# Patient Record
Sex: Female | Born: 1945
Health system: Southern US, Community
[De-identification: ages and names within clinical notes are randomized; demographics above are authoritative.]

## PROBLEM LIST (undated history)

## (undated) ENCOUNTER — Ambulatory Visit (HOSPITAL_COMMUNITY): Discharge status: Absent without leave | Payer: Commercial Managed Care - HMO

## (undated) DIAGNOSIS — I509 Heart failure, unspecified: Secondary | ICD-10-CM

## (undated) DIAGNOSIS — Z950 Presence of cardiac pacemaker: Secondary | ICD-10-CM

## (undated) DIAGNOSIS — K635 Polyp of colon: Secondary | ICD-10-CM

## (undated) DIAGNOSIS — L02619 Cutaneous abscess of unspecified foot: Secondary | ICD-10-CM

## (undated) DIAGNOSIS — I10 Essential (primary) hypertension: Secondary | ICD-10-CM

## (undated) DIAGNOSIS — K2970 Gastritis, unspecified, without bleeding: Secondary | ICD-10-CM

## (undated) DIAGNOSIS — I255 Ischemic cardiomyopathy: Secondary | ICD-10-CM

## (undated) DIAGNOSIS — M109 Gout, unspecified: Secondary | ICD-10-CM

## (undated) DIAGNOSIS — K760 Fatty (change of) liver, not elsewhere classified: Secondary | ICD-10-CM

## (undated) DIAGNOSIS — G8929 Other chronic pain: Secondary | ICD-10-CM

## (undated) DIAGNOSIS — Z9981 Dependence on supplemental oxygen: Secondary | ICD-10-CM

## (undated) DIAGNOSIS — Z95810 Presence of automatic (implantable) cardiac defibrillator: Secondary | ICD-10-CM

## (undated) DIAGNOSIS — I2510 Atherosclerotic heart disease of native coronary artery without angina pectoris: Secondary | ICD-10-CM

## (undated) DIAGNOSIS — L03119 Cellulitis of unspecified part of limb: Secondary | ICD-10-CM

## (undated) DIAGNOSIS — K5790 Diverticulosis of intestine, part unspecified, without perforation or abscess without bleeding: Secondary | ICD-10-CM

## (undated) DIAGNOSIS — E119 Type 2 diabetes mellitus without complications: Secondary | ICD-10-CM

## (undated) DIAGNOSIS — M545 Low back pain, unspecified: Secondary | ICD-10-CM

## (undated) DIAGNOSIS — E7800 Pure hypercholesterolemia, unspecified: Secondary | ICD-10-CM

## (undated) DIAGNOSIS — J42 Unspecified chronic bronchitis: Secondary | ICD-10-CM

## (undated) DIAGNOSIS — D649 Anemia, unspecified: Secondary | ICD-10-CM

## (undated) DIAGNOSIS — G4733 Obstructive sleep apnea (adult) (pediatric): Secondary | ICD-10-CM

## (undated) DIAGNOSIS — R0602 Shortness of breath: Secondary | ICD-10-CM

## (undated) DIAGNOSIS — K648 Other hemorrhoids: Secondary | ICD-10-CM

## (undated) DIAGNOSIS — J189 Pneumonia, unspecified organism: Secondary | ICD-10-CM

## (undated) DIAGNOSIS — Z8719 Personal history of other diseases of the digestive system: Secondary | ICD-10-CM

## (undated) DIAGNOSIS — R200 Anesthesia of skin: Secondary | ICD-10-CM

## (undated) DIAGNOSIS — M1990 Unspecified osteoarthritis, unspecified site: Secondary | ICD-10-CM

## (undated) DIAGNOSIS — G459 Transient cerebral ischemic attack, unspecified: Secondary | ICD-10-CM

## (undated) DIAGNOSIS — K589 Irritable bowel syndrome without diarrhea: Secondary | ICD-10-CM

## (undated) DIAGNOSIS — K219 Gastro-esophageal reflux disease without esophagitis: Secondary | ICD-10-CM

## (undated) DIAGNOSIS — E669 Obesity, unspecified: Secondary | ICD-10-CM

## (undated) HISTORY — DX: Polyp of colon: K63.5

## (undated) HISTORY — DX: Gastro-esophageal reflux disease without esophagitis: K21.9

## (undated) HISTORY — PX: TUBAL LIGATION: SHX77

## (undated) HISTORY — DX: Diverticulosis of intestine, part unspecified, without perforation or abscess without bleeding: K57.90

## (undated) HISTORY — PX: BIV ICD INSERTION CRT-D: EP1195

## (undated) HISTORY — DX: Other hemorrhoids: K64.8

## (undated) HISTORY — PX: APPENDECTOMY: SHX54

## (undated) HISTORY — DX: Gastritis, unspecified, without bleeding: K29.70

## (undated) HISTORY — PX: BACK SURGERY: SHX140

## (undated) HISTORY — PX: FRACTURE SURGERY: SHX138

## (undated) HISTORY — PX: ANTERIOR CERVICAL DECOMP/DISCECTOMY FUSION: SHX1161

## (undated) HISTORY — PX: ANKLE FRACTURE SURGERY: SHX122

## (undated) HISTORY — DX: Irritable bowel syndrome, unspecified: K58.9

## (undated) HISTORY — DX: Ischemic cardiomyopathy: I25.5

## (undated) HISTORY — DX: Anemia, unspecified: D64.9

## (undated) HISTORY — PX: KNEE ARTHROSCOPY: SHX127

## (undated) HISTORY — DX: Atherosclerotic heart disease of native coronary artery without angina pectoris: I25.10

## (undated) HISTORY — DX: Fatty (change of) liver, not elsewhere classified: K76.0

---

## 1997-02-10 HISTORY — PX: INSERT / REPLACE / REMOVE PACEMAKER: SUR710

## 1998-05-14 ENCOUNTER — Emergency Department (HOSPITAL_COMMUNITY): Admission: EM | Admit: 1998-05-14 | Discharge: 1998-05-14 | Payer: Self-pay | Admitting: Emergency Medicine

## 1998-05-14 ENCOUNTER — Encounter: Payer: Self-pay | Admitting: *Deleted

## 1999-05-16 ENCOUNTER — Inpatient Hospital Stay (HOSPITAL_COMMUNITY): Admission: EM | Admit: 1999-05-16 | Discharge: 1999-05-21 | Payer: Self-pay | Admitting: Emergency Medicine

## 1999-05-16 ENCOUNTER — Encounter: Payer: Self-pay | Admitting: Family Medicine

## 1999-05-17 HISTORY — PX: CARDIAC CATHETERIZATION: SHX172

## 1999-05-28 ENCOUNTER — Ambulatory Visit (HOSPITAL_COMMUNITY): Admission: RE | Admit: 1999-05-28 | Discharge: 1999-05-29 | Payer: Self-pay | Admitting: Cardiovascular Disease

## 1999-05-29 ENCOUNTER — Encounter: Payer: Self-pay | Admitting: Cardiovascular Disease

## 1999-06-04 ENCOUNTER — Encounter: Admission: RE | Admit: 1999-06-04 | Discharge: 1999-06-04 | Payer: Self-pay | Admitting: Family Medicine

## 1999-06-05 ENCOUNTER — Inpatient Hospital Stay (HOSPITAL_COMMUNITY): Admission: EM | Admit: 1999-06-05 | Discharge: 1999-06-08 | Payer: Self-pay | Admitting: Emergency Medicine

## 2000-07-01 ENCOUNTER — Ambulatory Visit (HOSPITAL_COMMUNITY): Admission: RE | Admit: 2000-07-01 | Discharge: 2000-07-01 | Payer: Self-pay | Admitting: Family Medicine

## 2000-07-01 ENCOUNTER — Encounter: Payer: Self-pay | Admitting: Family Medicine

## 2000-07-12 ENCOUNTER — Emergency Department (HOSPITAL_COMMUNITY): Admission: EM | Admit: 2000-07-12 | Discharge: 2000-07-12 | Payer: Self-pay | Admitting: Emergency Medicine

## 2001-01-06 ENCOUNTER — Ambulatory Visit (HOSPITAL_COMMUNITY): Admission: RE | Admit: 2001-01-06 | Discharge: 2001-01-06 | Payer: Self-pay | Admitting: *Deleted

## 2001-01-06 ENCOUNTER — Encounter: Payer: Self-pay | Admitting: *Deleted

## 2001-02-15 ENCOUNTER — Encounter: Payer: Self-pay | Admitting: *Deleted

## 2001-02-15 ENCOUNTER — Encounter: Admission: RE | Admit: 2001-02-15 | Discharge: 2001-02-15 | Payer: Self-pay | Admitting: *Deleted

## 2001-02-18 ENCOUNTER — Encounter: Payer: Self-pay | Admitting: *Deleted

## 2001-02-18 ENCOUNTER — Ambulatory Visit (HOSPITAL_COMMUNITY): Admission: RE | Admit: 2001-02-18 | Discharge: 2001-02-19 | Payer: Self-pay | Admitting: *Deleted

## 2002-02-16 DIAGNOSIS — K7689 Other specified diseases of liver: Secondary | ICD-10-CM | POA: Insufficient documentation

## 2002-07-08 ENCOUNTER — Encounter: Payer: Self-pay | Admitting: Emergency Medicine

## 2002-07-08 ENCOUNTER — Observation Stay (HOSPITAL_COMMUNITY): Admission: EM | Admit: 2002-07-08 | Discharge: 2002-07-09 | Payer: Self-pay | Admitting: Emergency Medicine

## 2002-07-08 HISTORY — PX: CARDIAC CATHETERIZATION: SHX172

## 2002-09-27 ENCOUNTER — Encounter: Payer: Self-pay | Admitting: Nurse Practitioner

## 2002-09-27 ENCOUNTER — Encounter: Payer: Self-pay | Admitting: Internal Medicine

## 2002-09-27 DIAGNOSIS — D126 Benign neoplasm of colon, unspecified: Secondary | ICD-10-CM | POA: Insufficient documentation

## 2002-11-25 ENCOUNTER — Encounter: Payer: Self-pay | Admitting: Family Medicine

## 2002-11-25 ENCOUNTER — Ambulatory Visit (HOSPITAL_COMMUNITY): Admission: RE | Admit: 2002-11-25 | Discharge: 2002-11-25 | Payer: Self-pay | Admitting: Family Medicine

## 2003-08-11 ENCOUNTER — Emergency Department (HOSPITAL_COMMUNITY): Admission: EM | Admit: 2003-08-11 | Discharge: 2003-08-11 | Payer: Self-pay | Admitting: Family Medicine

## 2003-12-31 ENCOUNTER — Emergency Department (HOSPITAL_COMMUNITY): Admission: EM | Admit: 2003-12-31 | Discharge: 2003-12-31 | Payer: Self-pay | Admitting: Emergency Medicine

## 2004-02-16 ENCOUNTER — Observation Stay (HOSPITAL_COMMUNITY): Admission: EM | Admit: 2004-02-16 | Discharge: 2004-02-17 | Payer: Self-pay | Admitting: Emergency Medicine

## 2004-07-14 ENCOUNTER — Emergency Department (HOSPITAL_COMMUNITY): Admission: EM | Admit: 2004-07-14 | Discharge: 2004-07-14 | Payer: Self-pay | Admitting: Emergency Medicine

## 2004-11-18 ENCOUNTER — Ambulatory Visit (HOSPITAL_COMMUNITY): Admission: RE | Admit: 2004-11-18 | Discharge: 2004-11-18 | Payer: Self-pay | Admitting: Family Medicine

## 2004-12-03 ENCOUNTER — Emergency Department (HOSPITAL_COMMUNITY): Admission: EM | Admit: 2004-12-03 | Discharge: 2004-12-03 | Payer: Self-pay | Admitting: Emergency Medicine

## 2004-12-26 IMAGING — CT CT HEAD W/O CM
1 of 2 series · 14 of 30 positions shown, 18 images · IV contrast (agent unspecified)
Comparison: none

CLINICAL DATA: Dizziness.  Weakness.
TECHNIQUE: Contiguous axial CT images were obtained from the skull base through the vertex.
 No prior studies. 
 CT OF THE HEAD WITHOUT CONTRAST:

[Series 2: brain · axial · 0.47mm/px · z∈[+187,+306]mm · 14 of 36 slices shown, 18 images]
[im 3/36  brain]
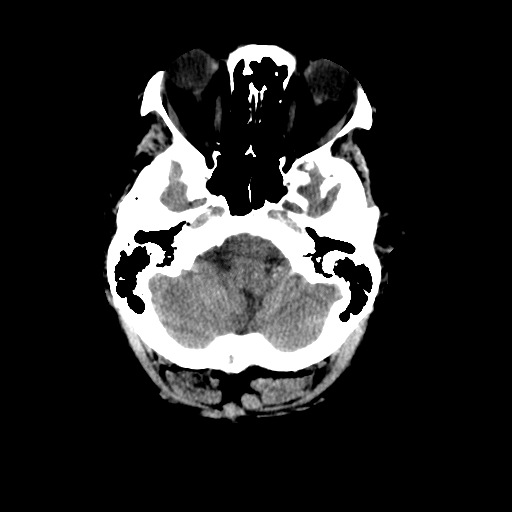
[im 3/36  bone]
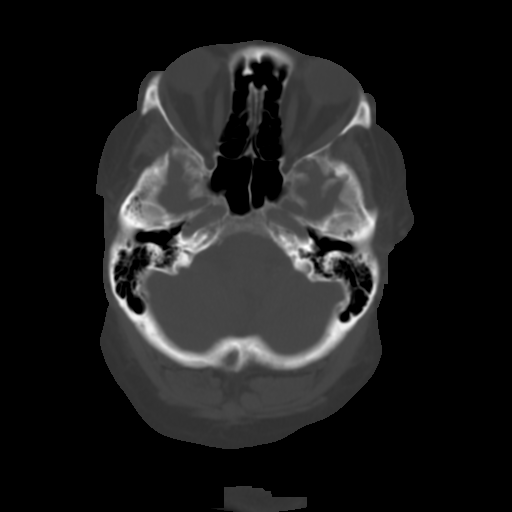
[im 5/36  brain]
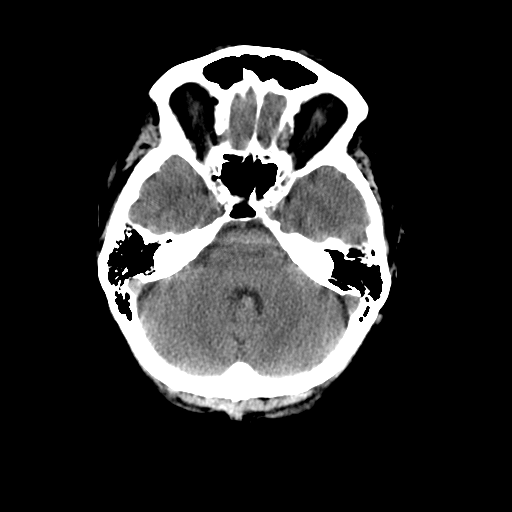
[im 8/36  brain]
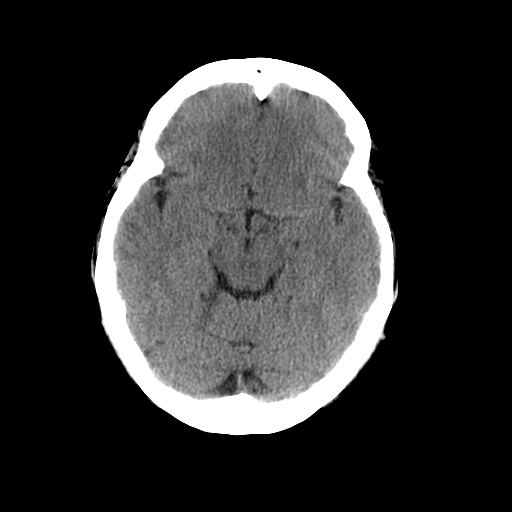
[im 10/36  brain]
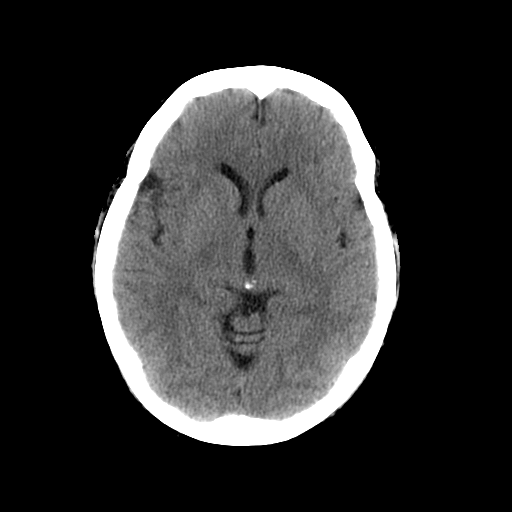
[im 12/36  brain]
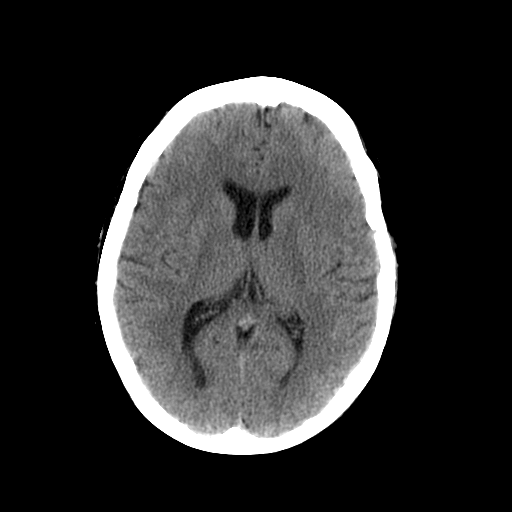
[im 12/36  bone]
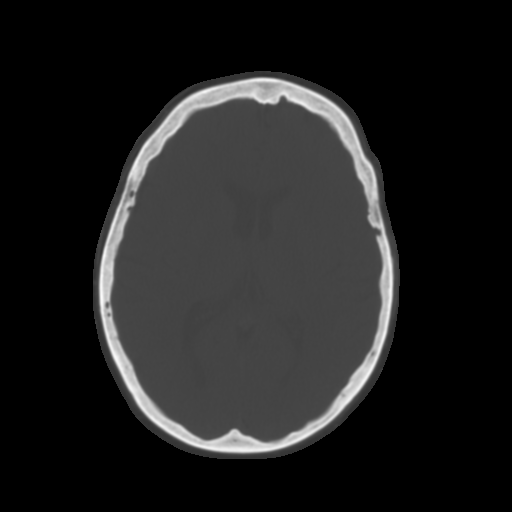
[im 15/36  brain]
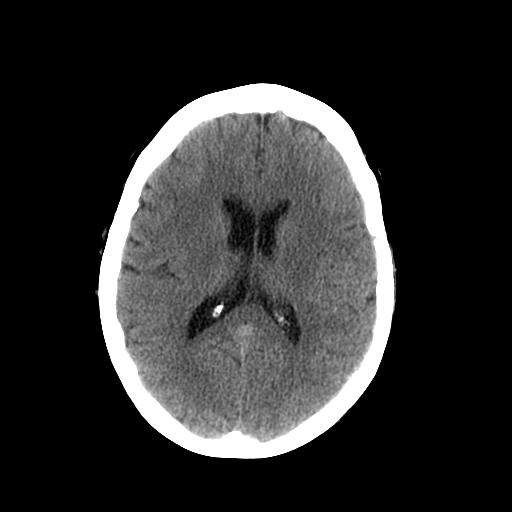
[im 17/36  brain]
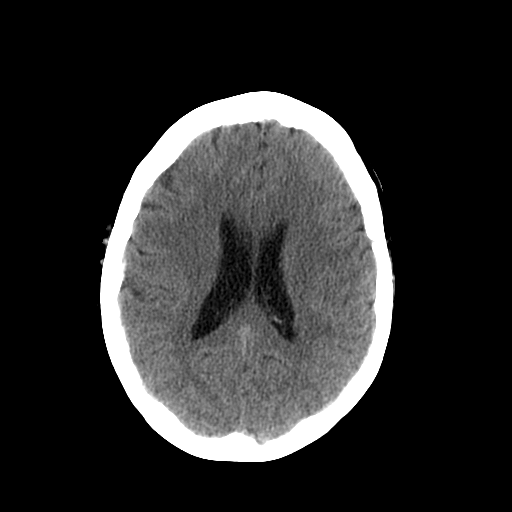
[im 19/36  brain]
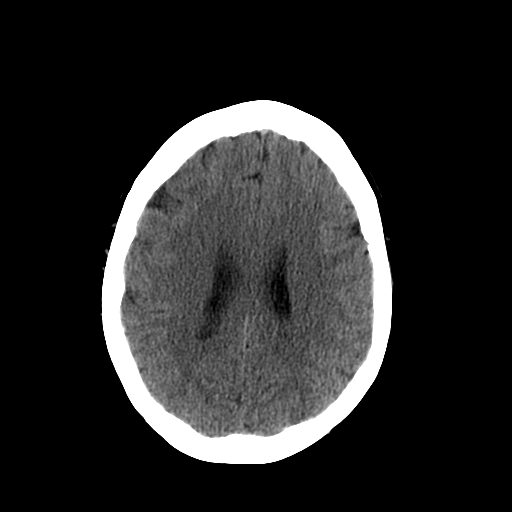
[im 22/36  brain]
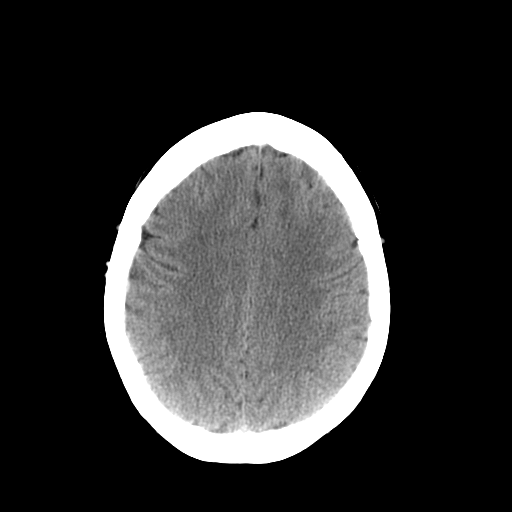
[im 22/36  bone]
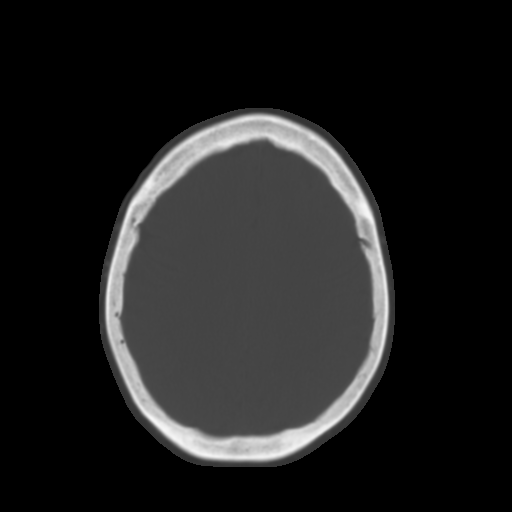
[im 24/36  brain]
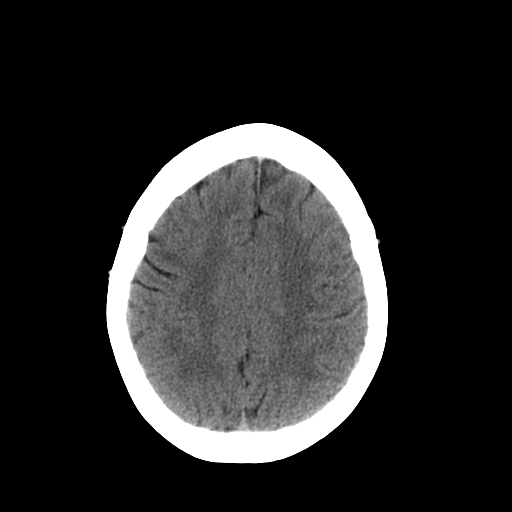
[im 26/36  brain]
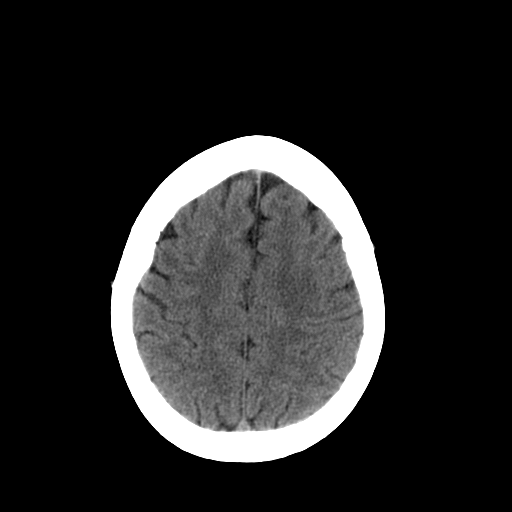
[im 29/36  brain]
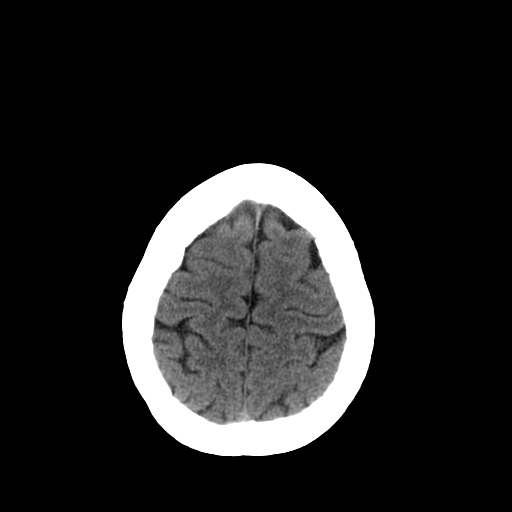
[im 31/36  brain]
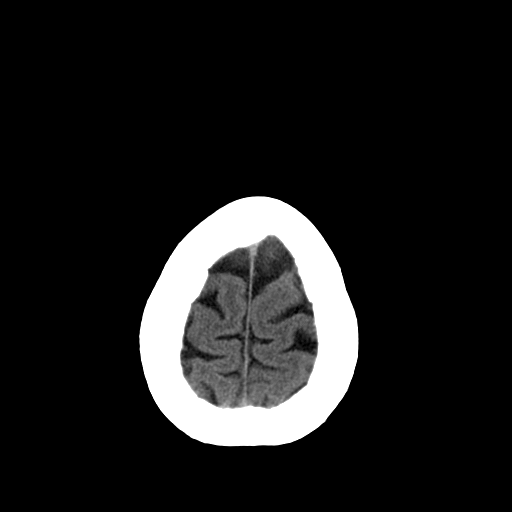
[im 31/36  bone]
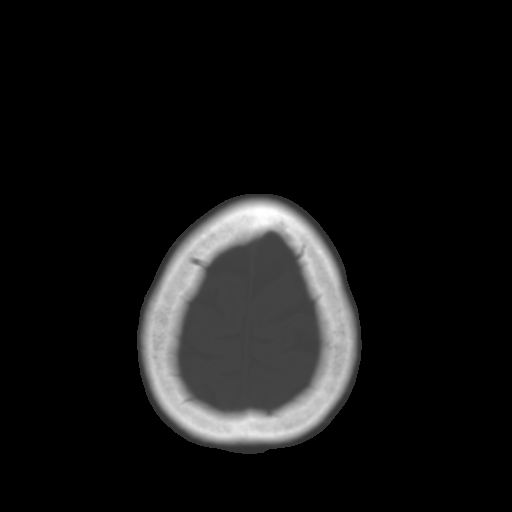
[im 33/36  brain]
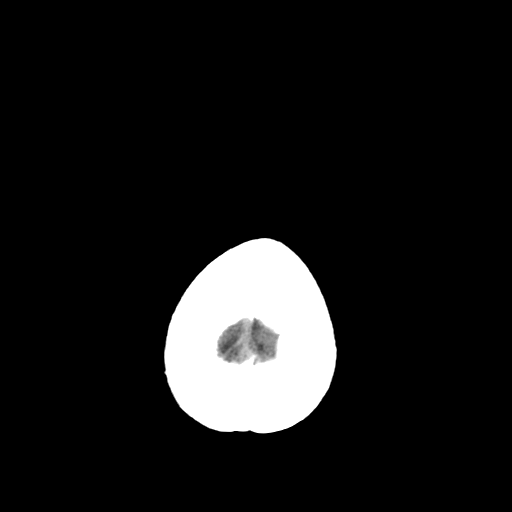

[14 of 30 positions shown; findings below may reference images not displayed]

FINDINGS: Vague hypodensity in the left pons and midbrain is thought to represent artifact.  There is a suggestion of minimal chronic microvascular white matter disease.  No acute intracranial findings are evident.
IMPRESSION: No acute intracranial findings are noted.  Please note that acute CVA can be occult on CT scan.

## 2004-12-26 IMAGING — CR DG CHEST 2V
2 series · 2 of 2 positions shown · non-contrast
Comparison: none

CLINICAL DATA: Dizziness, chest congestion, shortness of breath, asthma, hypertension. 
 TWO VIEW CHEST:
 Comparing to a report from prior chest radiograph dated 07/08/02.

[view not recorded (1 of 2)]
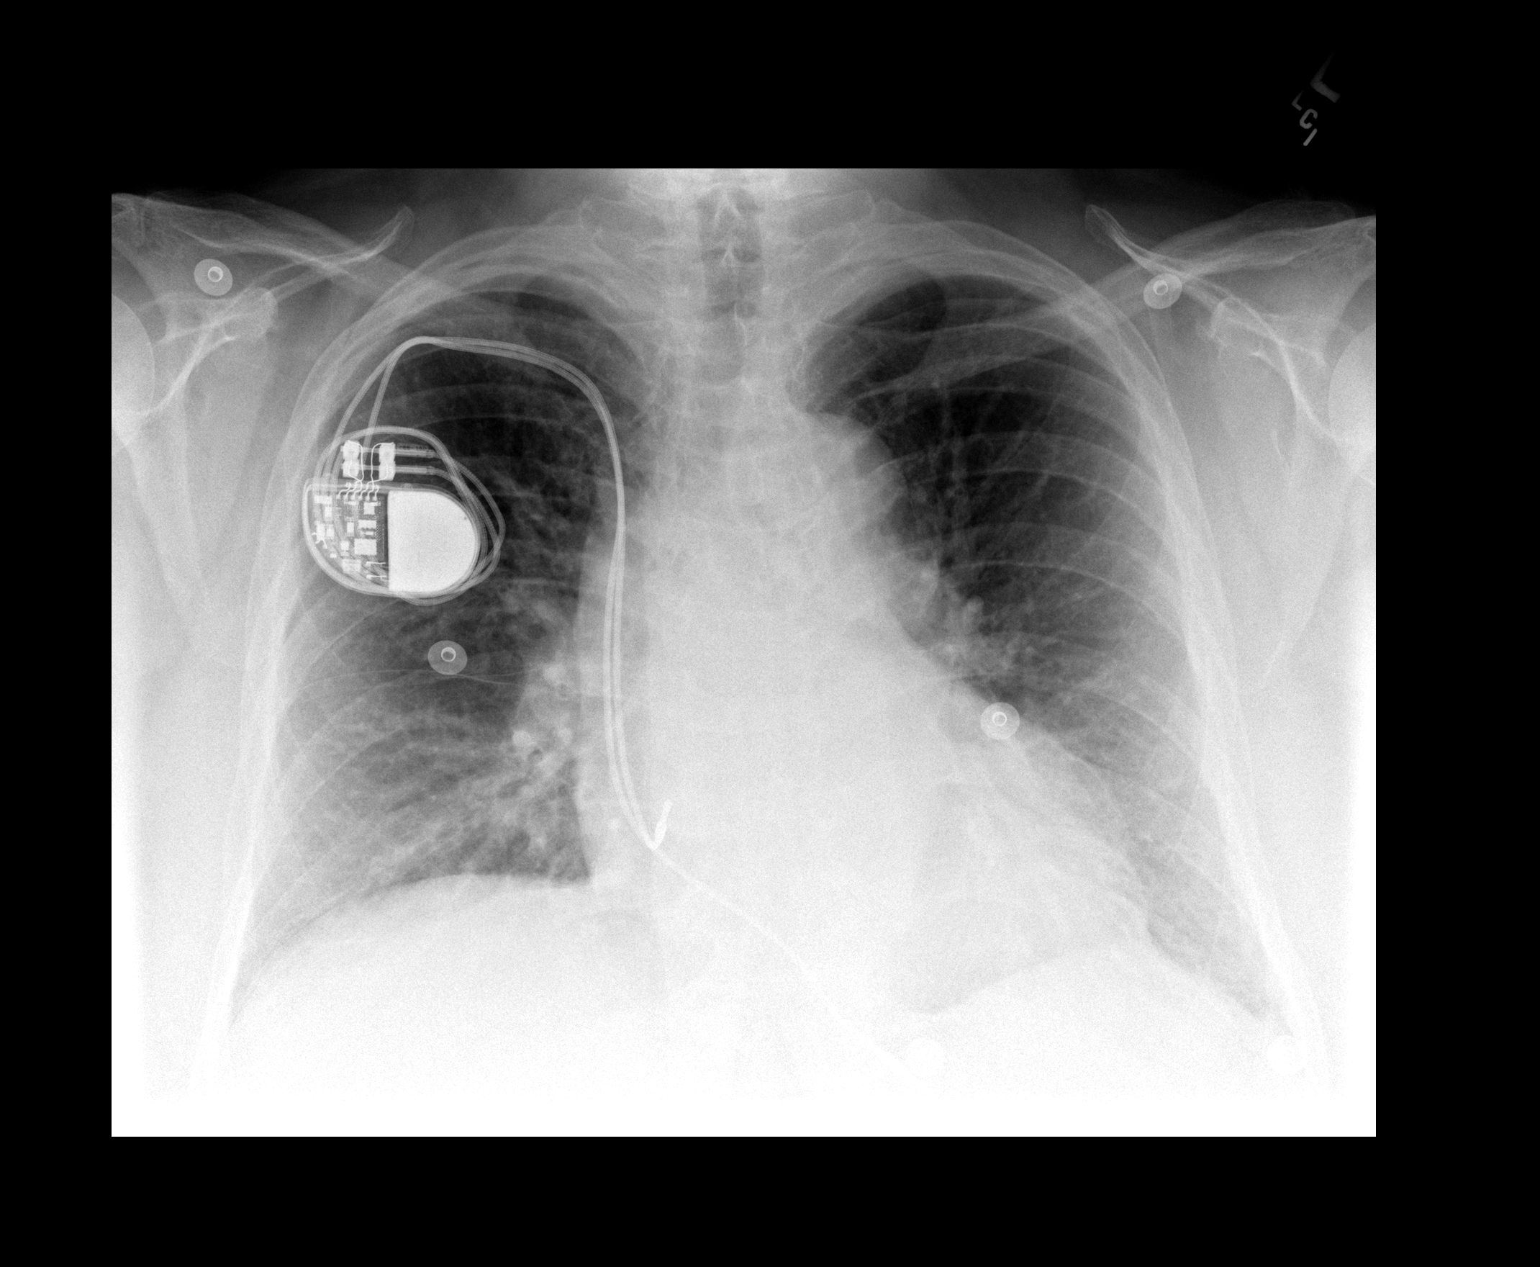

[view not recorded (2 of 2)]
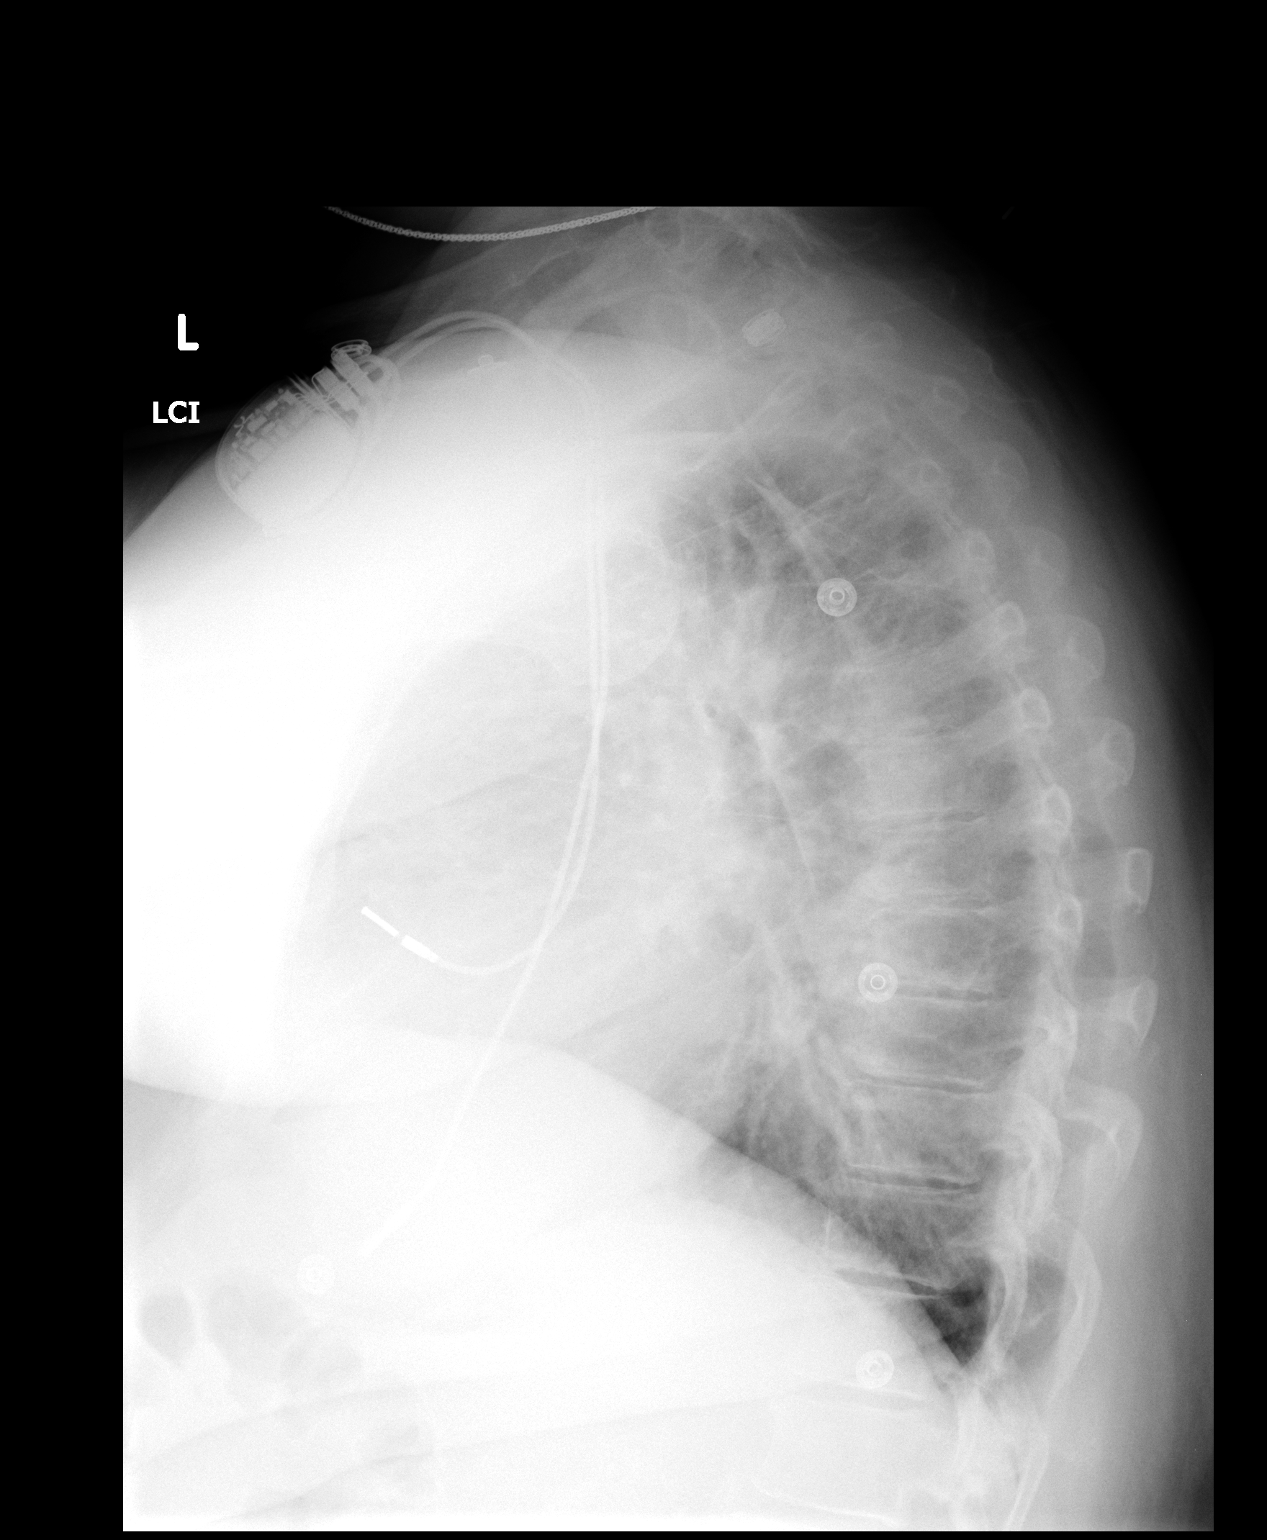

[2 of 2 positions shown; findings below may reference images not displayed]

FINDINGS: Patient has a pacer device.  Cardiomegaly is present.  Prominence of the pulmonary vasculature suggests pulmonary venous hypertension.  There is some mild tortuosity of the thoracic aorta. There is also some mild airway thickening centrally.
IMPRESSION: 1.  Mild airway thickening centrally suggesting bronchitis or reactive airways disease. 
 2.  Mild cardiomegaly and borderline pulmonary venous hypertension.

## 2005-02-11 IMAGING — CR DG CHEST 1V PORT
1 series · 1 of 1 positions shown · non-contrast
Comparison: none

CLINICAL DATA: 58 year old with chest pain, shortness of breath, hypertension and history of smoking. Comparison: 12/31/03. 
 The patient has a right-sided transvenous pacemaker with leads overlying the right atrium and right ventricle.  Cardiac size is mildly enlarged but there is no evidence for pulmonary edema.   No focal consolidations or pleural effusion.

[view not recorded]
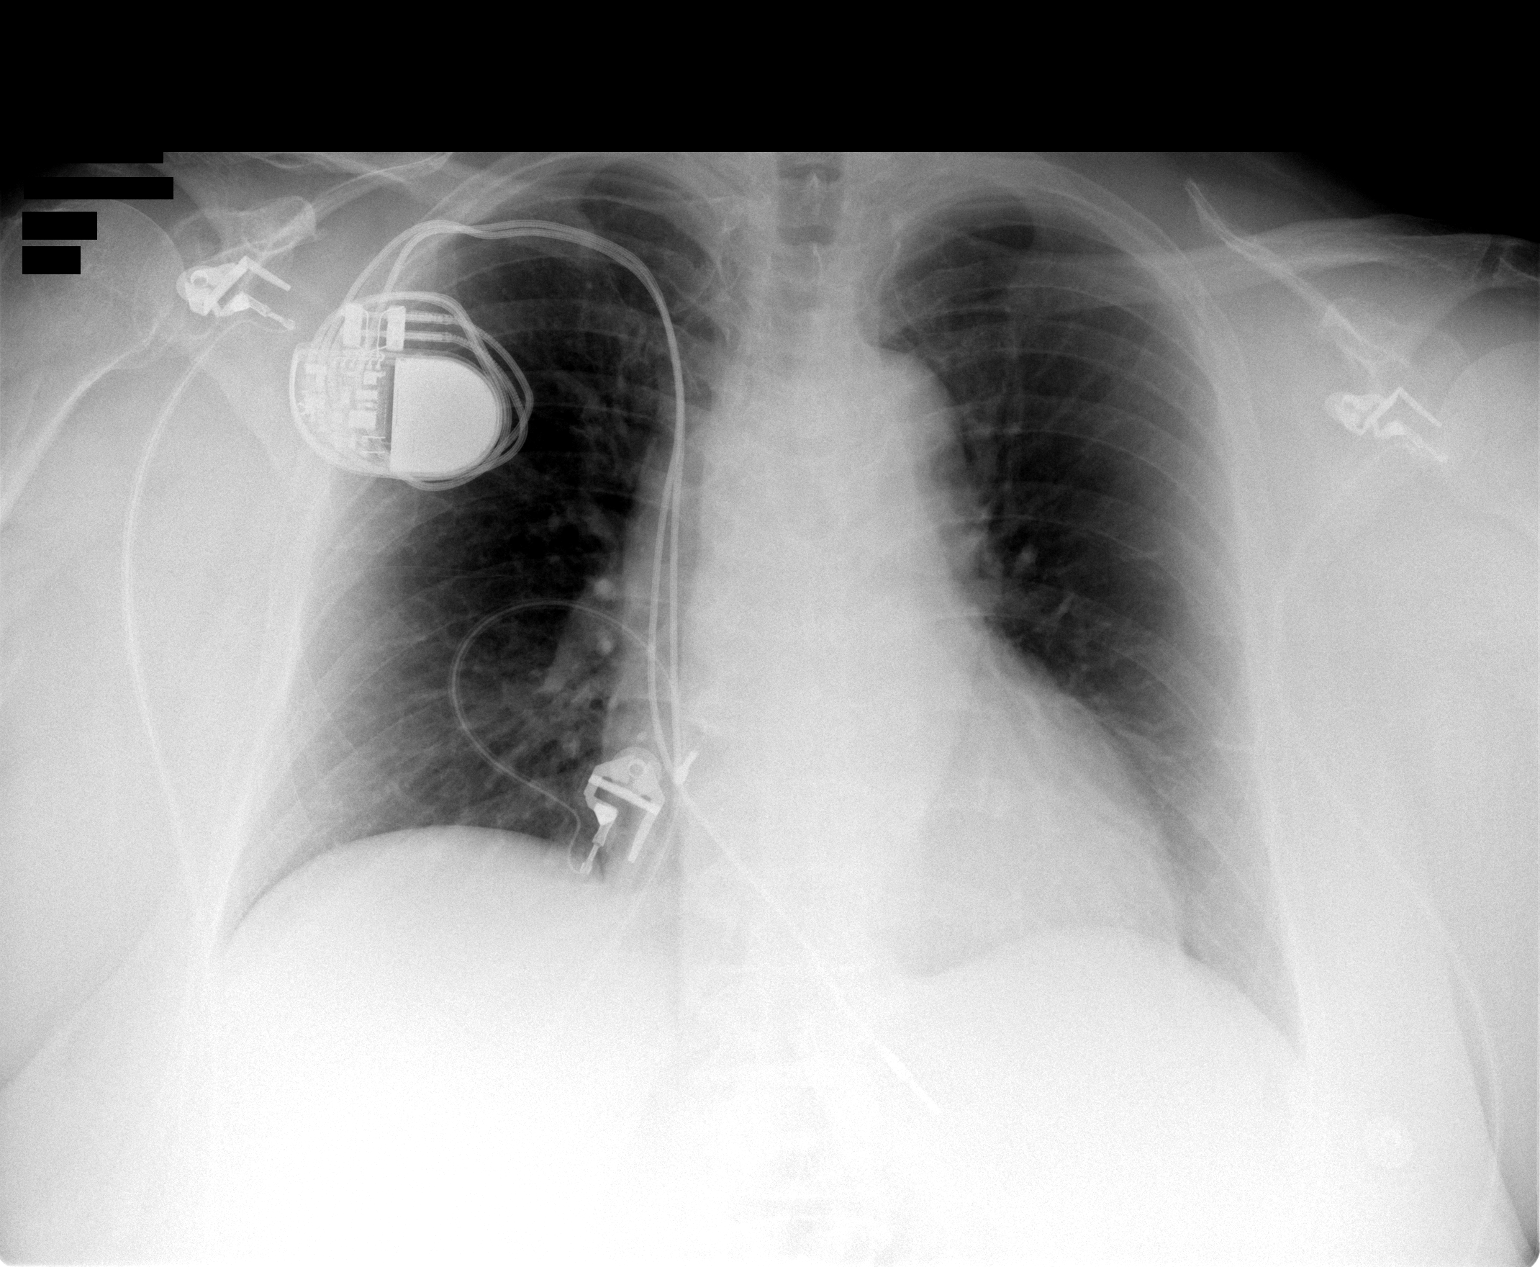

[1 of 1 positions shown; findings below may reference images not displayed]

IMPRESSION: Mild cardiomegaly without evidence for pulmonary edema.

## 2005-06-17 ENCOUNTER — Ambulatory Visit (HOSPITAL_BASED_OUTPATIENT_CLINIC_OR_DEPARTMENT_OTHER): Admission: RE | Admit: 2005-06-17 | Discharge: 2005-06-17 | Payer: Self-pay | Admitting: Otolaryngology

## 2005-06-17 ENCOUNTER — Encounter (INDEPENDENT_AMBULATORY_CARE_PROVIDER_SITE_OTHER): Payer: Self-pay | Admitting: Specialist

## 2005-06-27 ENCOUNTER — Emergency Department (HOSPITAL_COMMUNITY): Admission: EM | Admit: 2005-06-27 | Discharge: 2005-06-27 | Payer: Self-pay | Admitting: Emergency Medicine

## 2005-07-10 IMAGING — CR DG CHEST 2V
2 series · 2 of 2 positions shown · non-contrast
Comparison: Portable chest x-ray 02/16/2004.

CLINICAL DATA: History of asthma. One week history of shortness of breath and
chest pain.

CHEST - 2 VIEW  07/14/2004:

[view not recorded (1 of 2)]
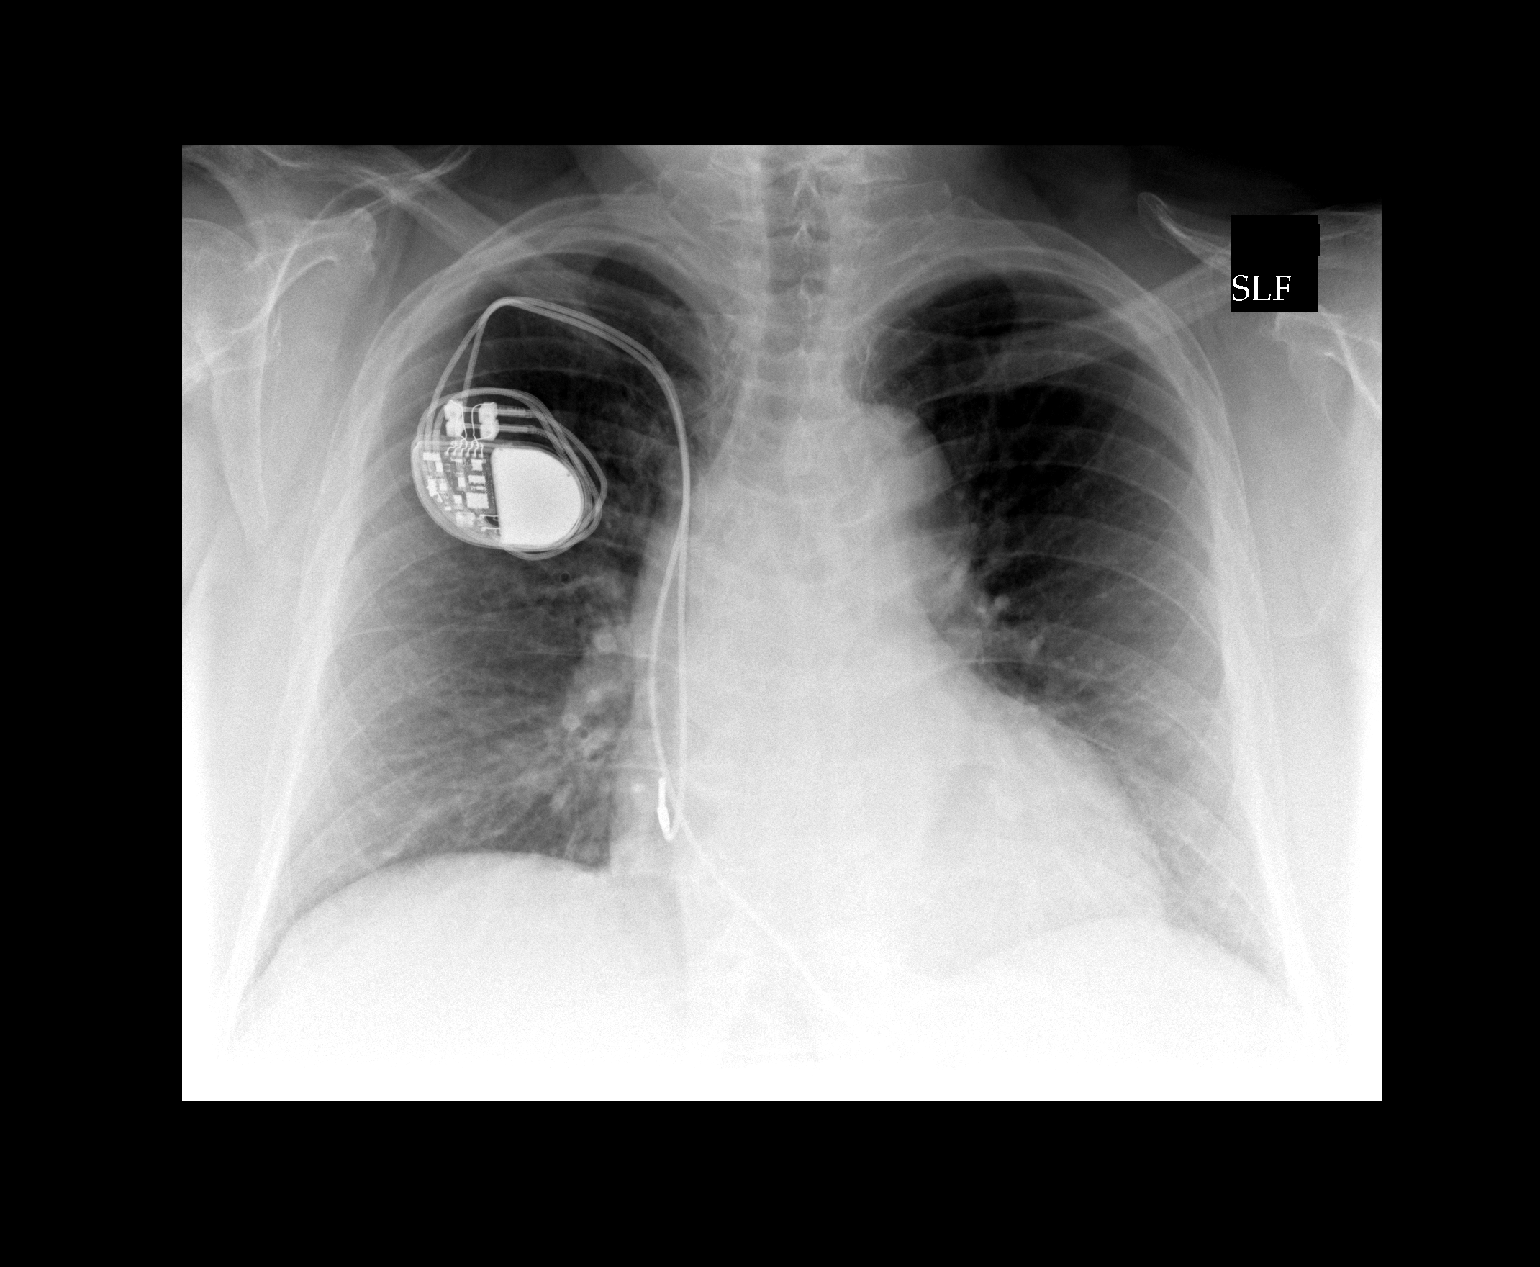

[view not recorded (2 of 2)]
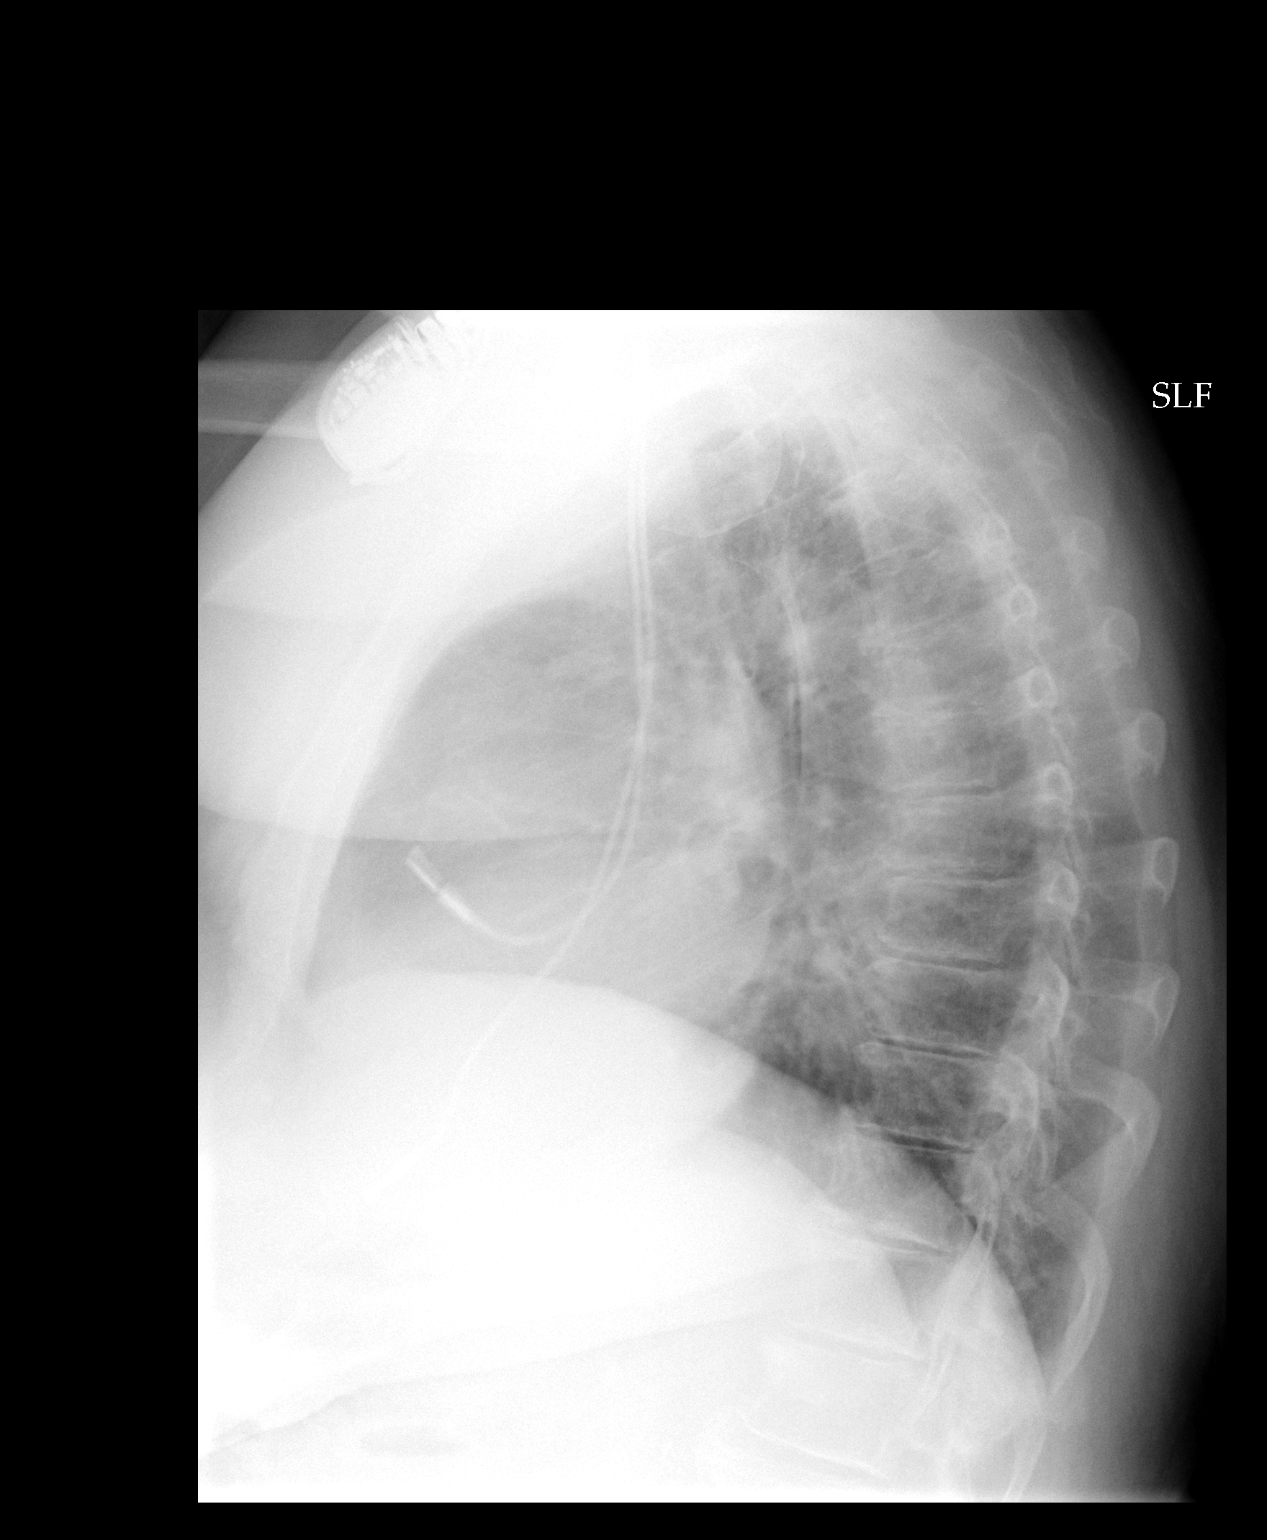

[2 of 2 positions shown; findings below may reference images not displayed]

FINDINGS: The heart is enlarged but stable. Pulmonary vascularity is normal and
there is no evidence of pulmonary edema. The bronchovascular markings are
diffusely prominent with central peribronchial thickening in a pattern that has
worsened in the interval. There are no confluent areas of consolidation. There
are no pleural effusions. Degenerative changes are noted throughout the thoracic
spine. The right subclavian dual-lead transvenous pacemaker is unchanged and
intact.
IMPRESSION: Stable cardiomegaly. Moderate changes of acute asthma/bronchitis without
localized consolidation.

## 2005-11-11 ENCOUNTER — Encounter: Admission: RE | Admit: 2005-11-11 | Discharge: 2005-11-11 | Payer: Self-pay | Admitting: Orthopaedic Surgery

## 2005-11-17 ENCOUNTER — Ambulatory Visit (HOSPITAL_COMMUNITY): Admission: RE | Admit: 2005-11-17 | Discharge: 2005-11-18 | Payer: Self-pay | Admitting: Orthopaedic Surgery

## 2005-11-29 IMAGING — CR DG LUMBAR SPINE COMPLETE 4+V
5 series · 5 of 5 positions shown · non-contrast
Comparison: None.

CLINICAL DATA: Pain in left leg after fall.
LUMBAR SPINE - 5 VIEW:

[t l-spine lat *]
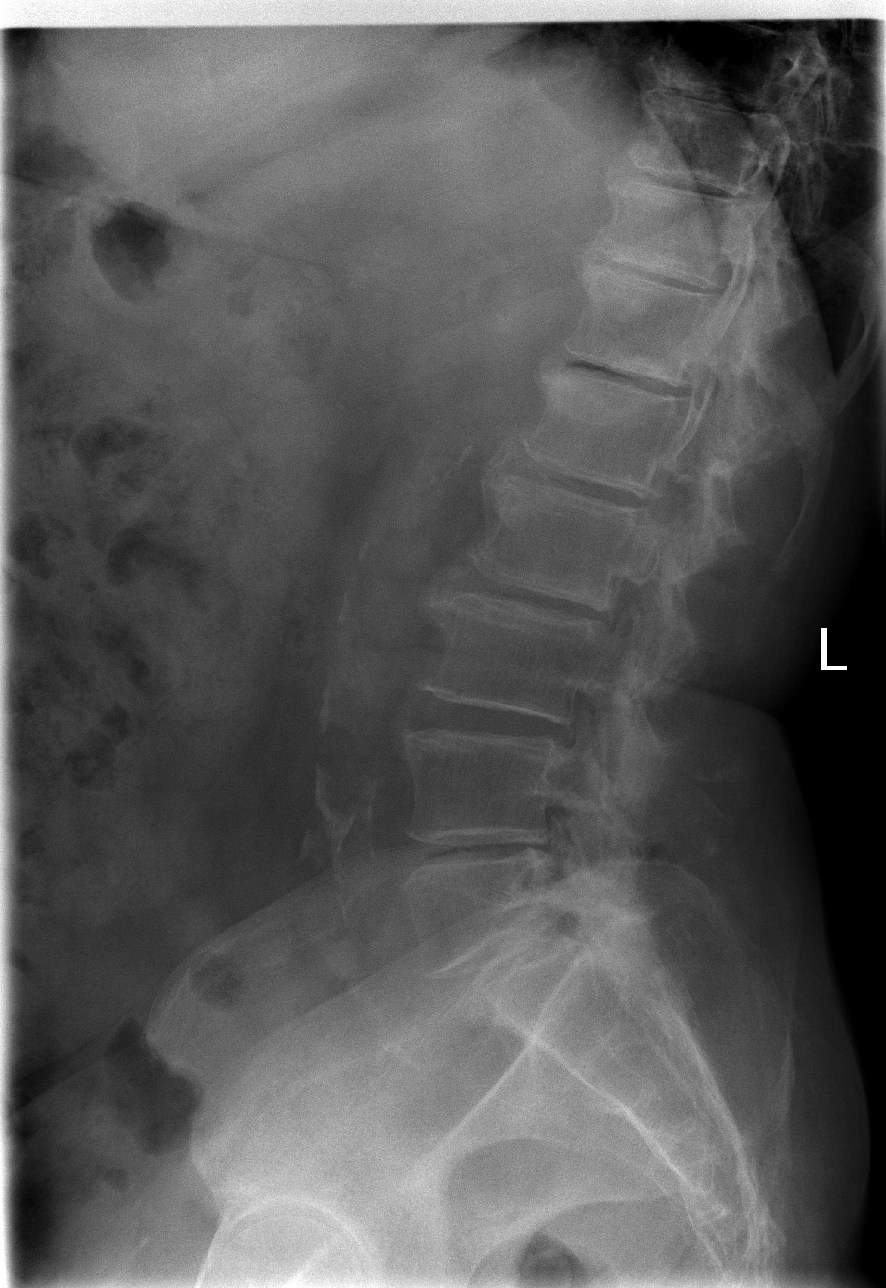

[t l-spine l5-s1 spot]
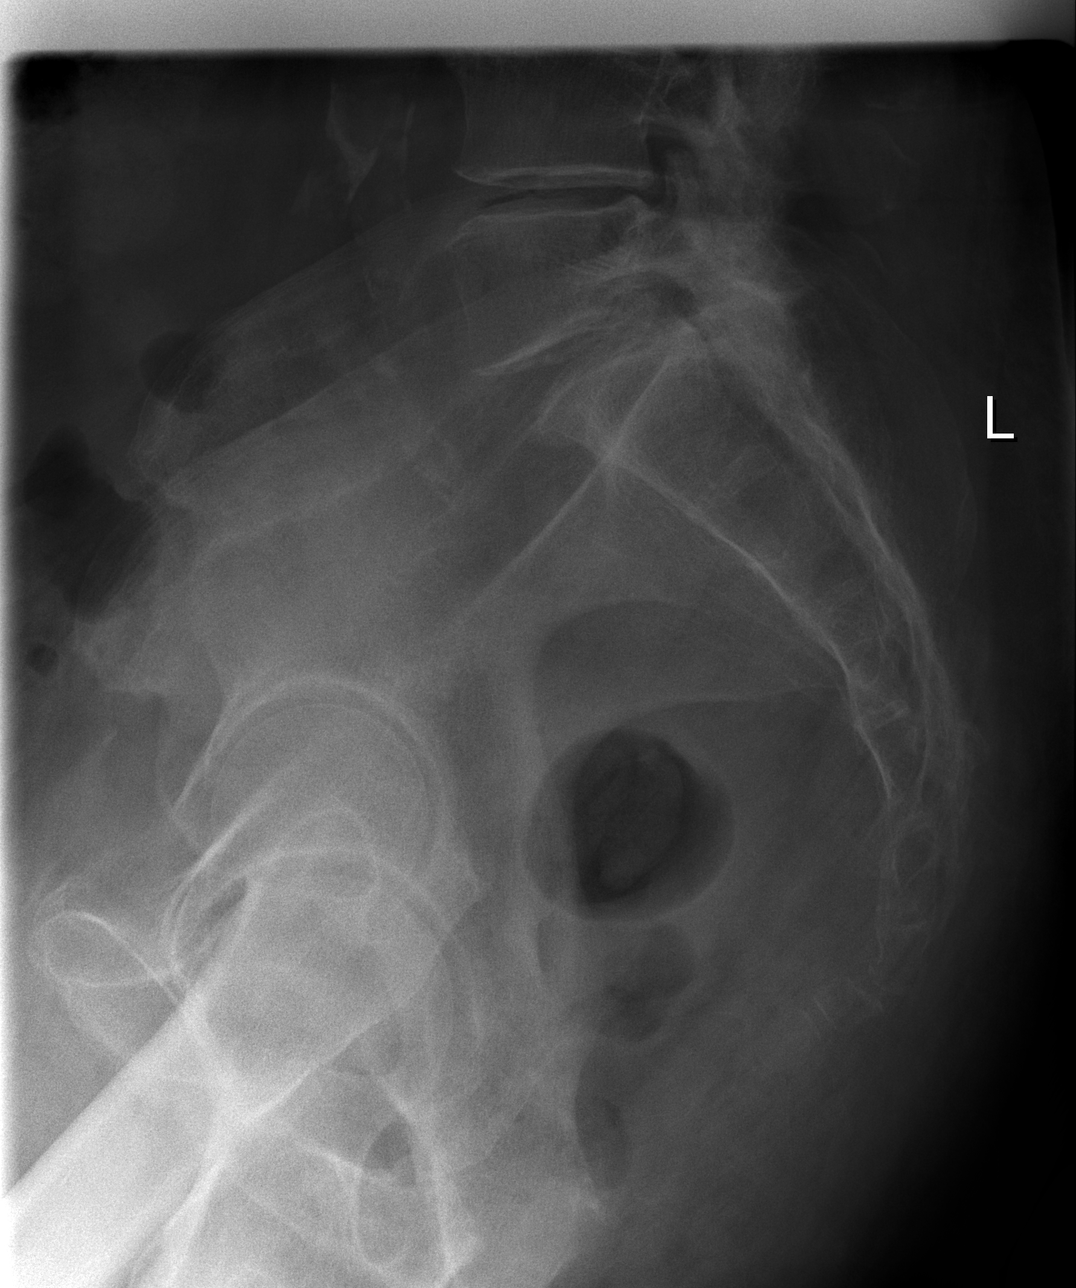

[t l-spine oblique exposure * (1 of 2)]
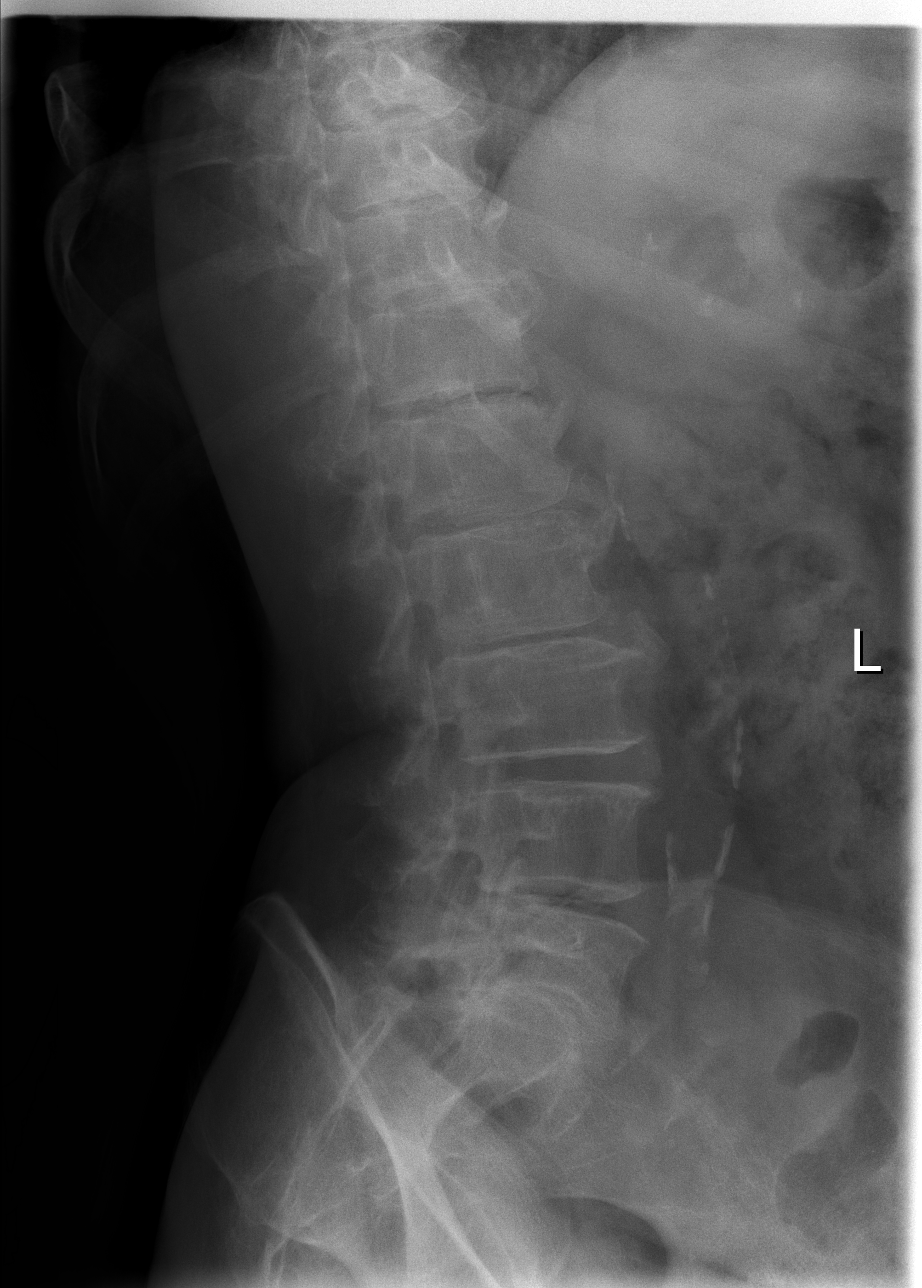

[t l-spine a.p. *]
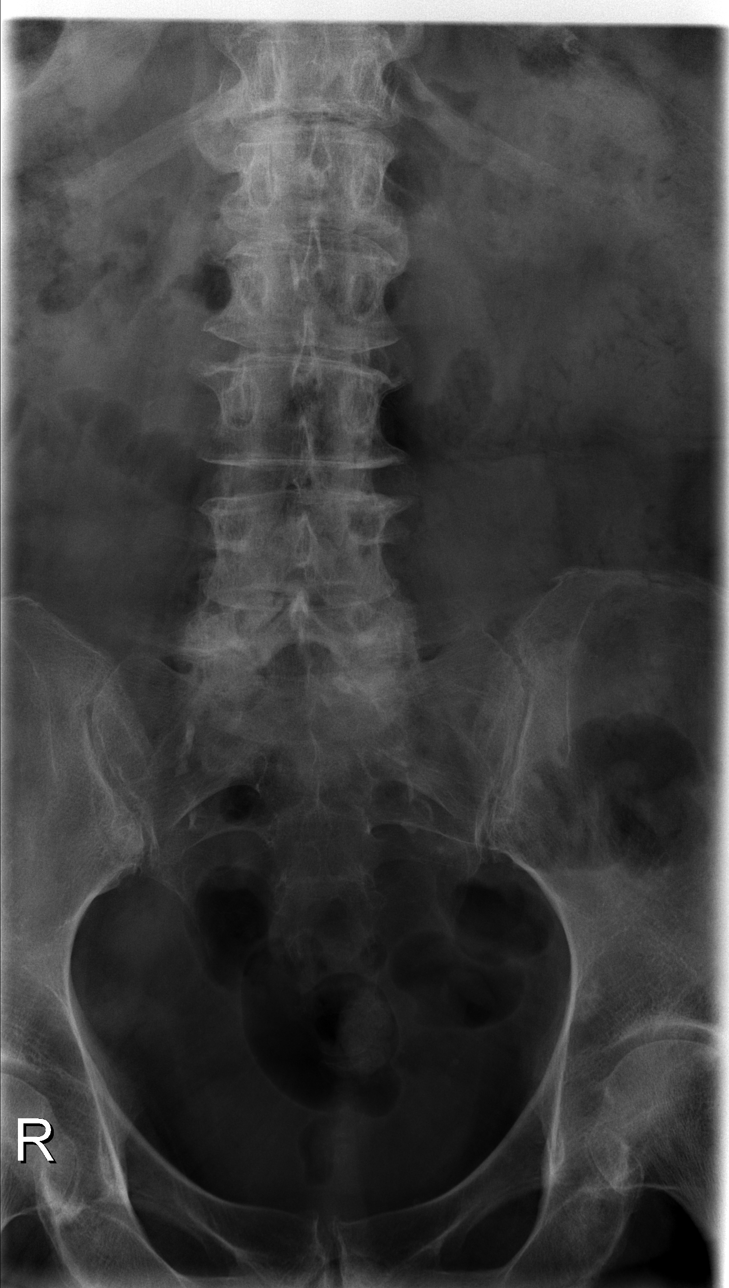

[t l-spine oblique exposure * (2 of 2)]
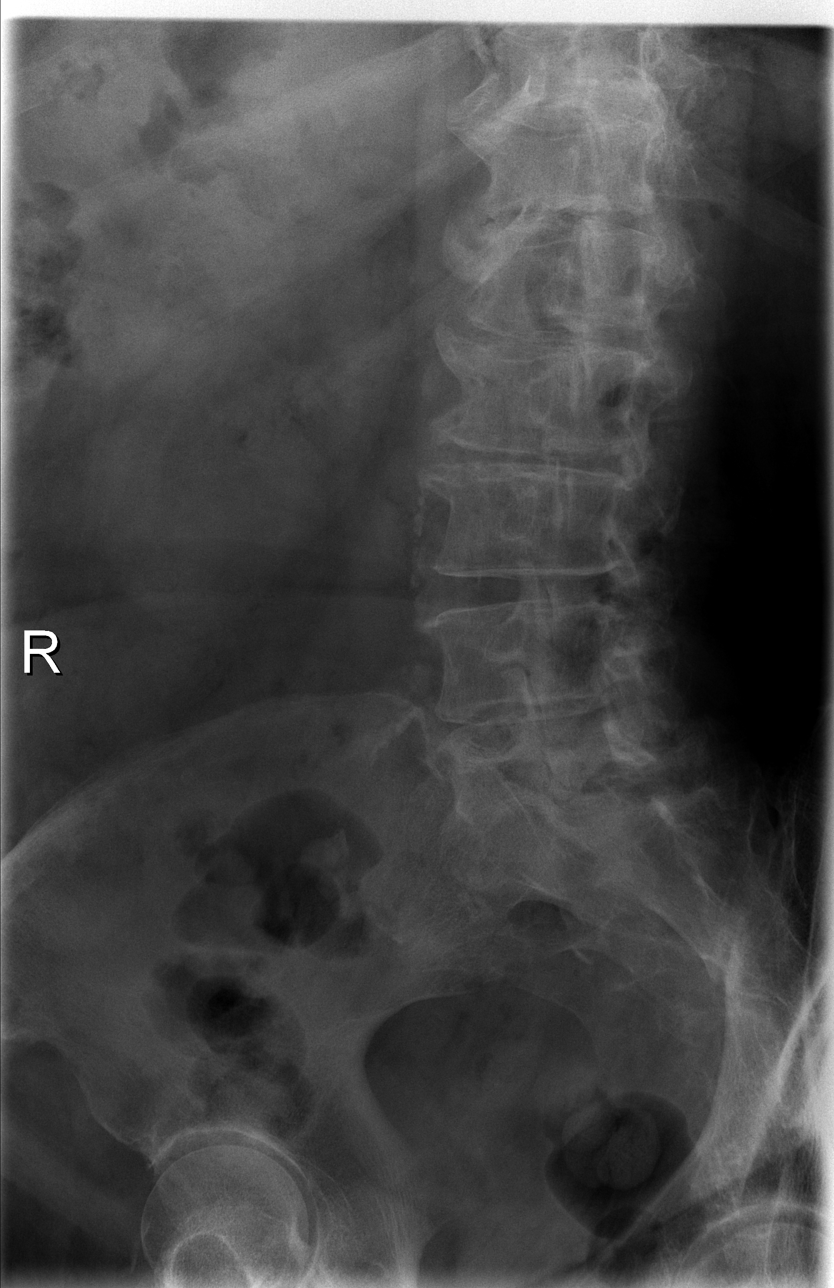

[5 of 5 positions shown; findings below may reference images not displayed]

There is grade I anterolisthesis of L5 on S1 and trace retrolisthesis of L4 on L5.  Trace retrolisthesis of L3 on L4.  Vertebral body height is maintained.  Multilevel spondylosis including degenerative disk disease at the lumbosacral junction and severe thoracolumbar degenerative disk disease.  No evidence of vertebral body height loss.  Atherosclerosis in the abdominal aorta.  Oblique images demonstrate alligned facet joints, without pars defect.  Frontal view demonstrates normal appearance of the SI joints and visualized portions of the pelvis.
IMPRESSION: 1.  No plain film evidence of compression deformity.
2.  Multilevel spondylosis with foci of grade I malalignment as described.

## 2005-11-29 IMAGING — CR DG HIP (WITH OR WITHOUT PELVIS) 2-3V*L*
3 series · 3 of 3 positions shown · non-contrast
Comparison: None.

CLINICAL DATA: Trauma and pain. 
 LEFT HIP - 2 VIEW:

[t hip frog leg left *]
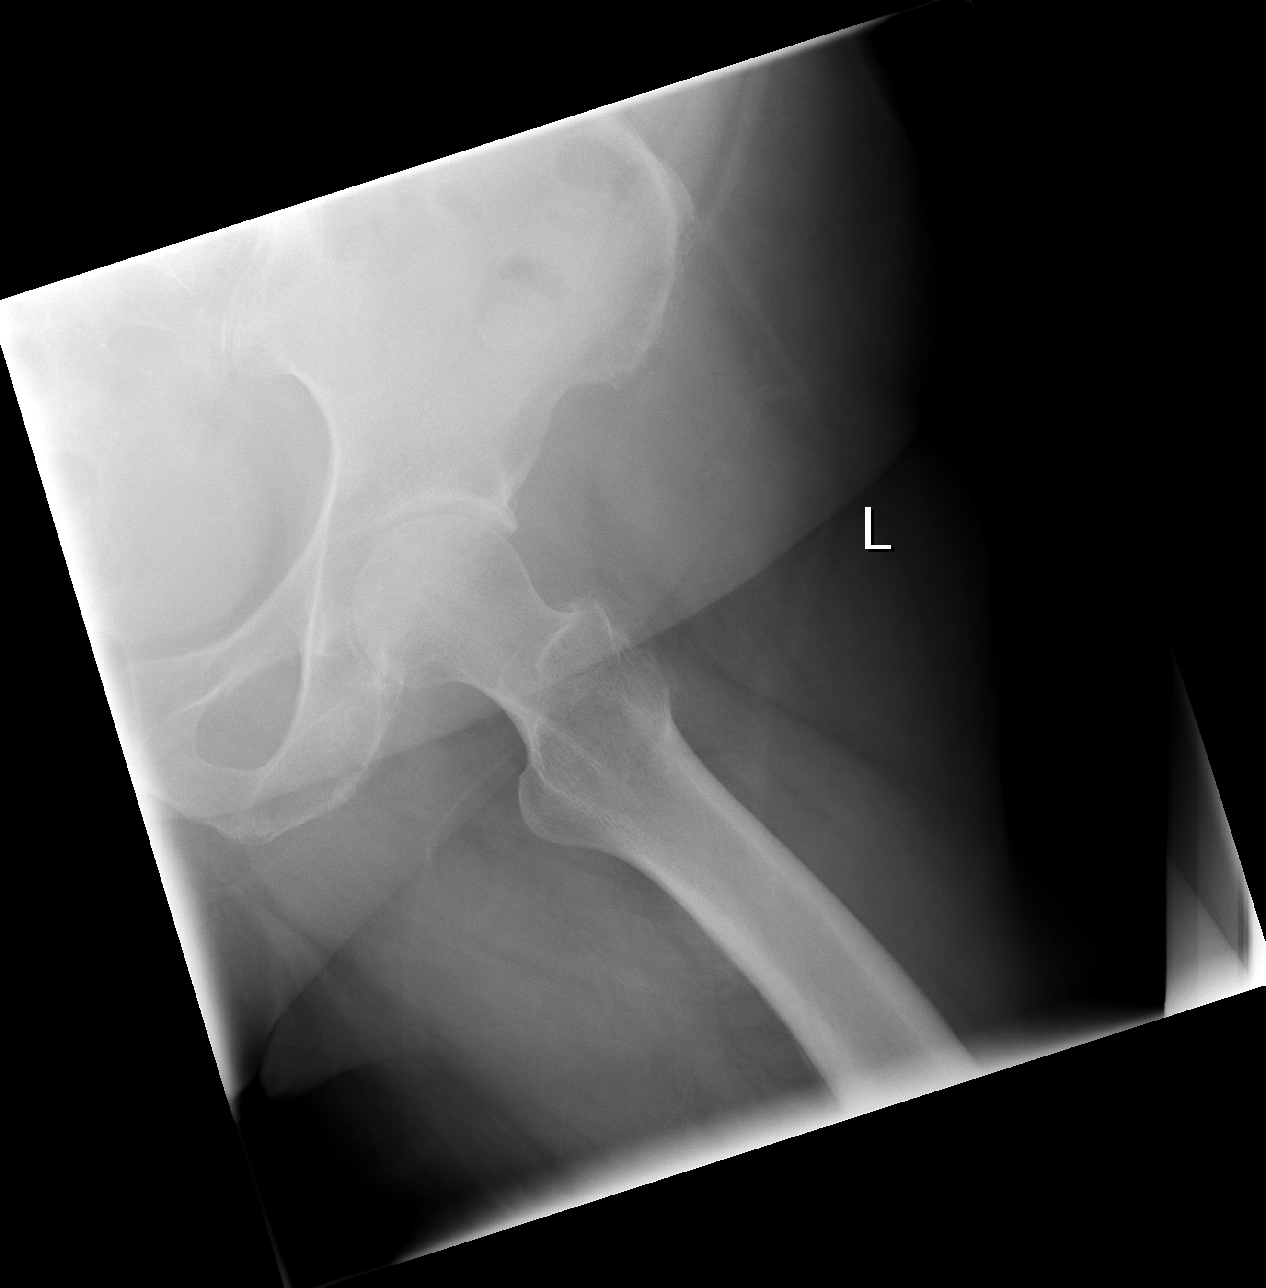

[t pelvis a.p. *]
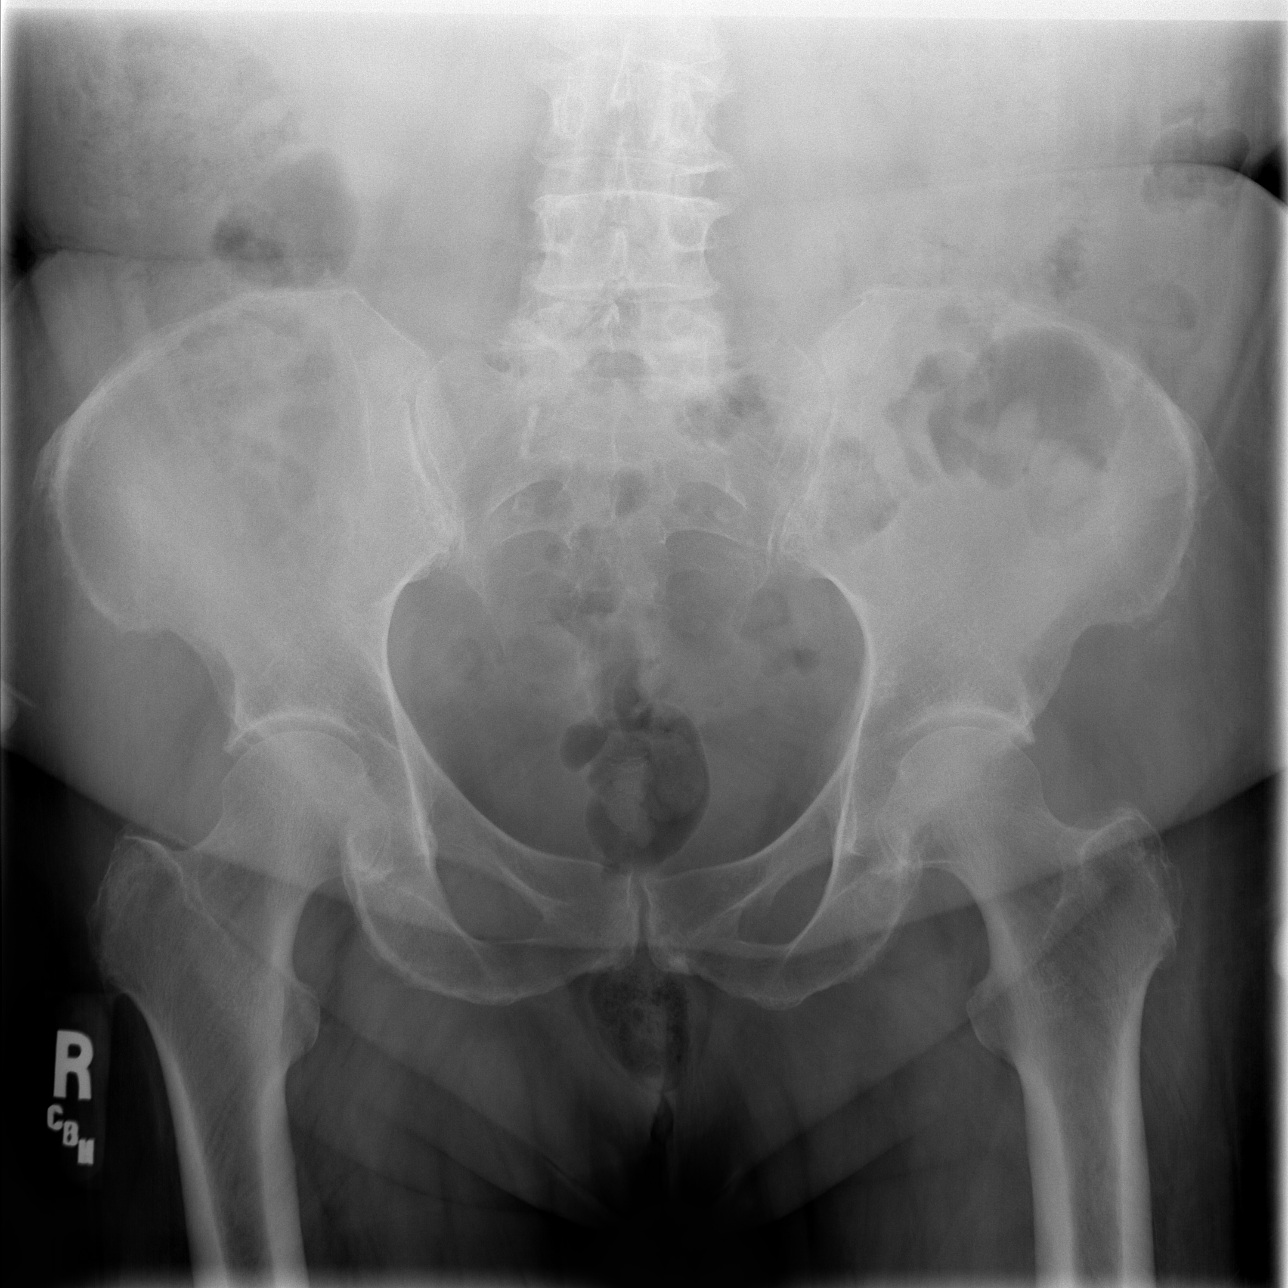

[t hip ap left *]
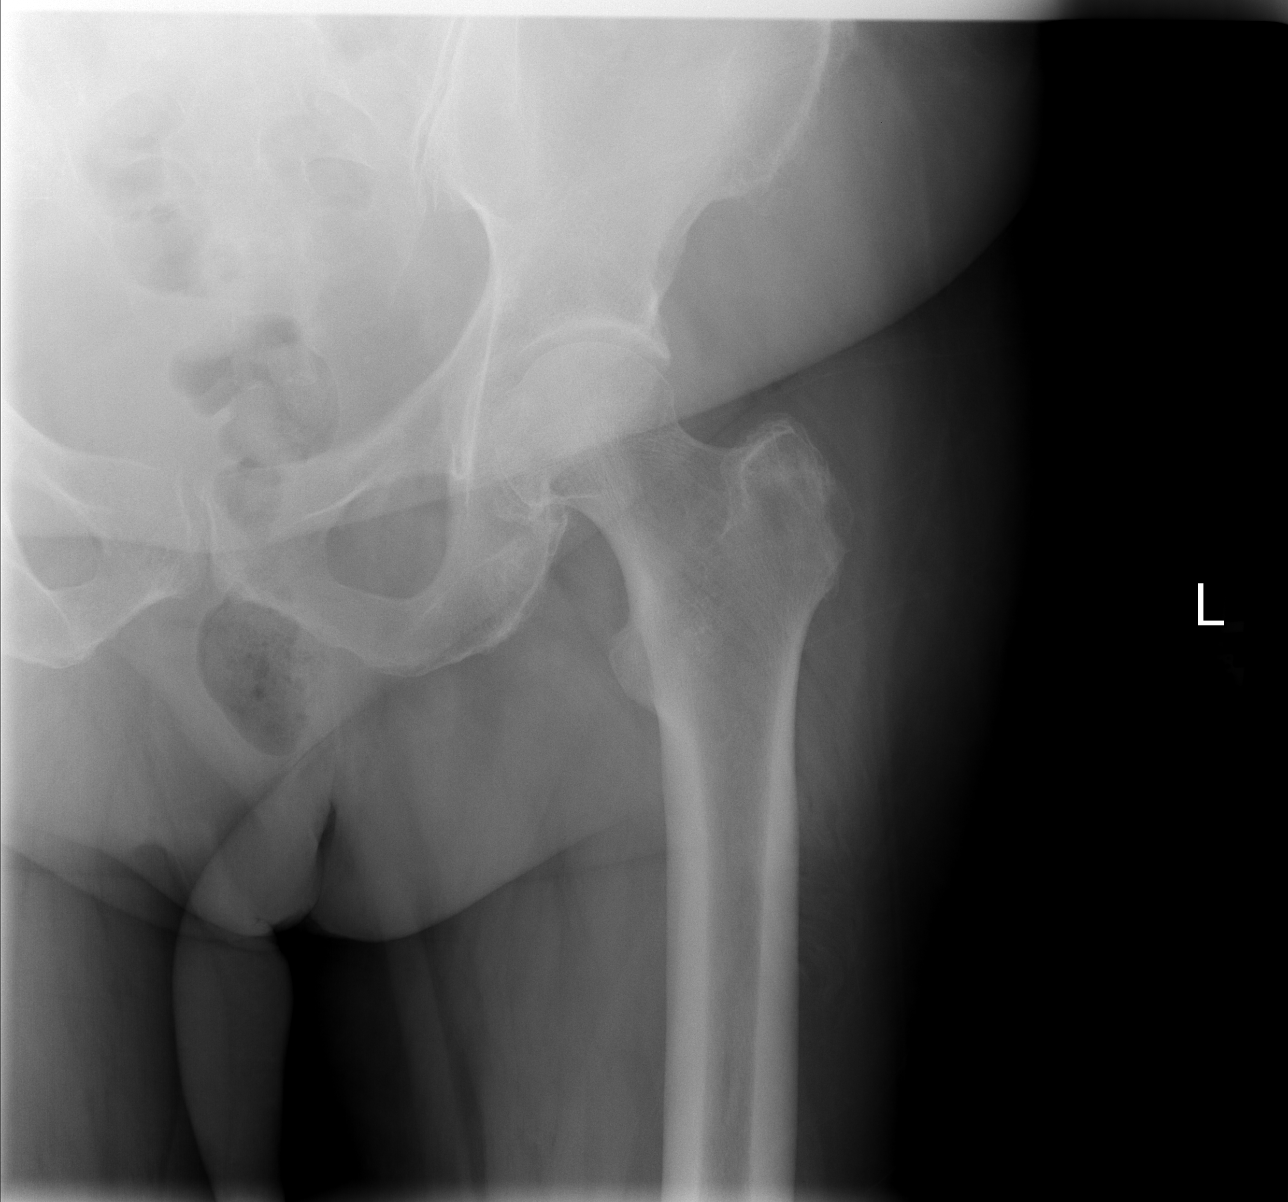

[3 of 3 positions shown; findings below may reference images not displayed]

FINDINGS: Left femoral head is located.  There is no evidence of fracture by plain film.  The ilioischial and iliopectineal lines are intact.  The sacroiliac joint and visualized portions of the sacrum are unremarkable.
IMPRESSION: No plain film evidence of fracture or dislocation. If there is high clinical suspicion, MRI should be considered.

## 2005-11-29 IMAGING — CR DG FEMUR 2V*L*
2 series · 2 of 2 positions shown · non-contrast
Comparison: Hip films earlier in the day.

CLINICAL DATA: Trauma.
 LEFT FEMUR - 2 VIEW:

[t femur with knee ap left *]
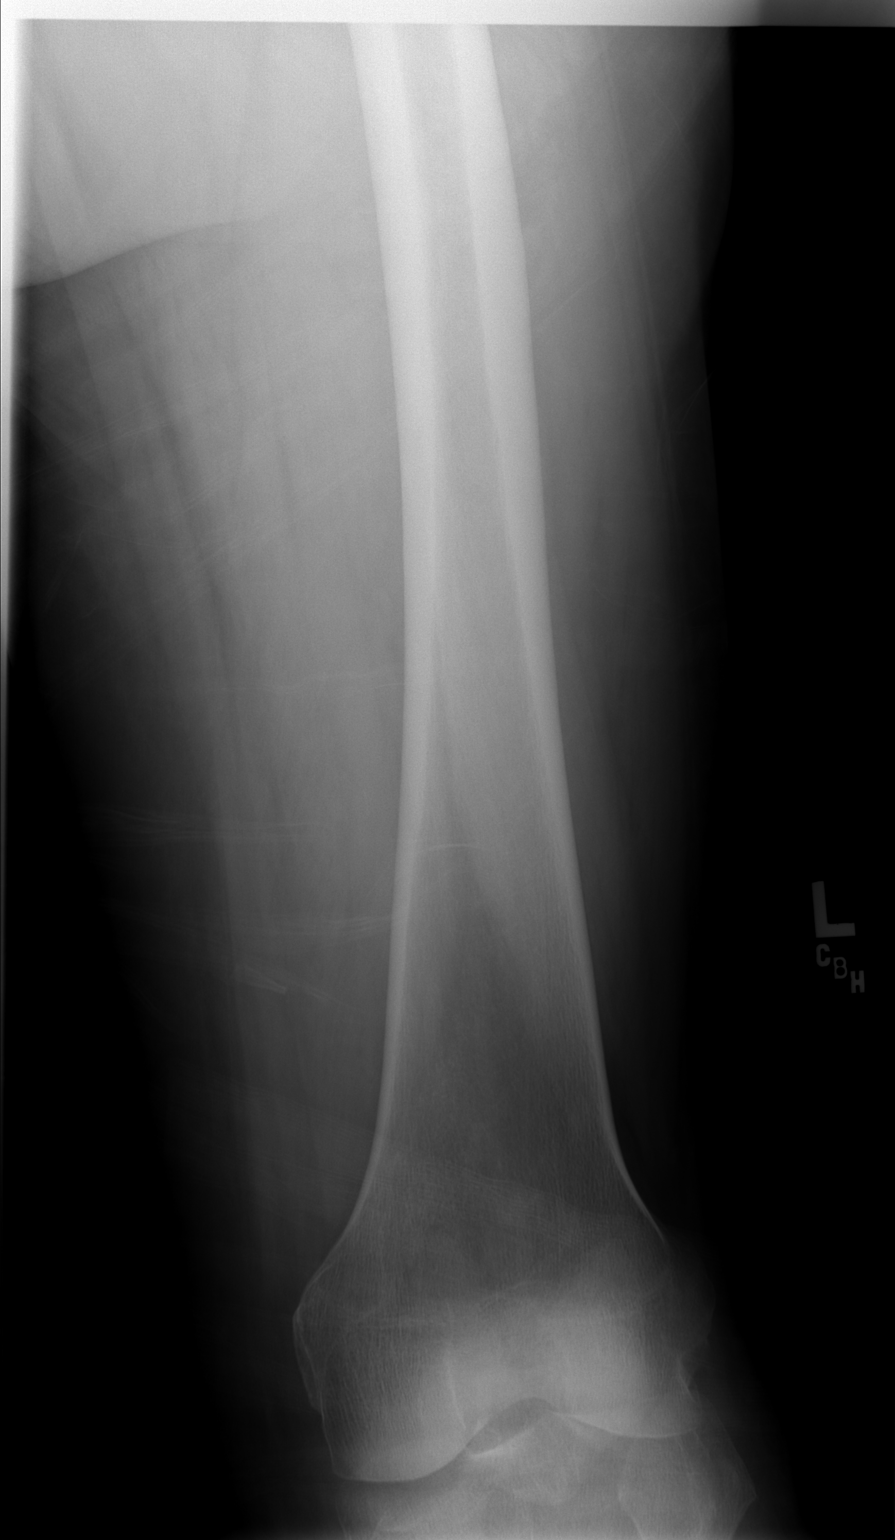

[t femur with knee lat left]
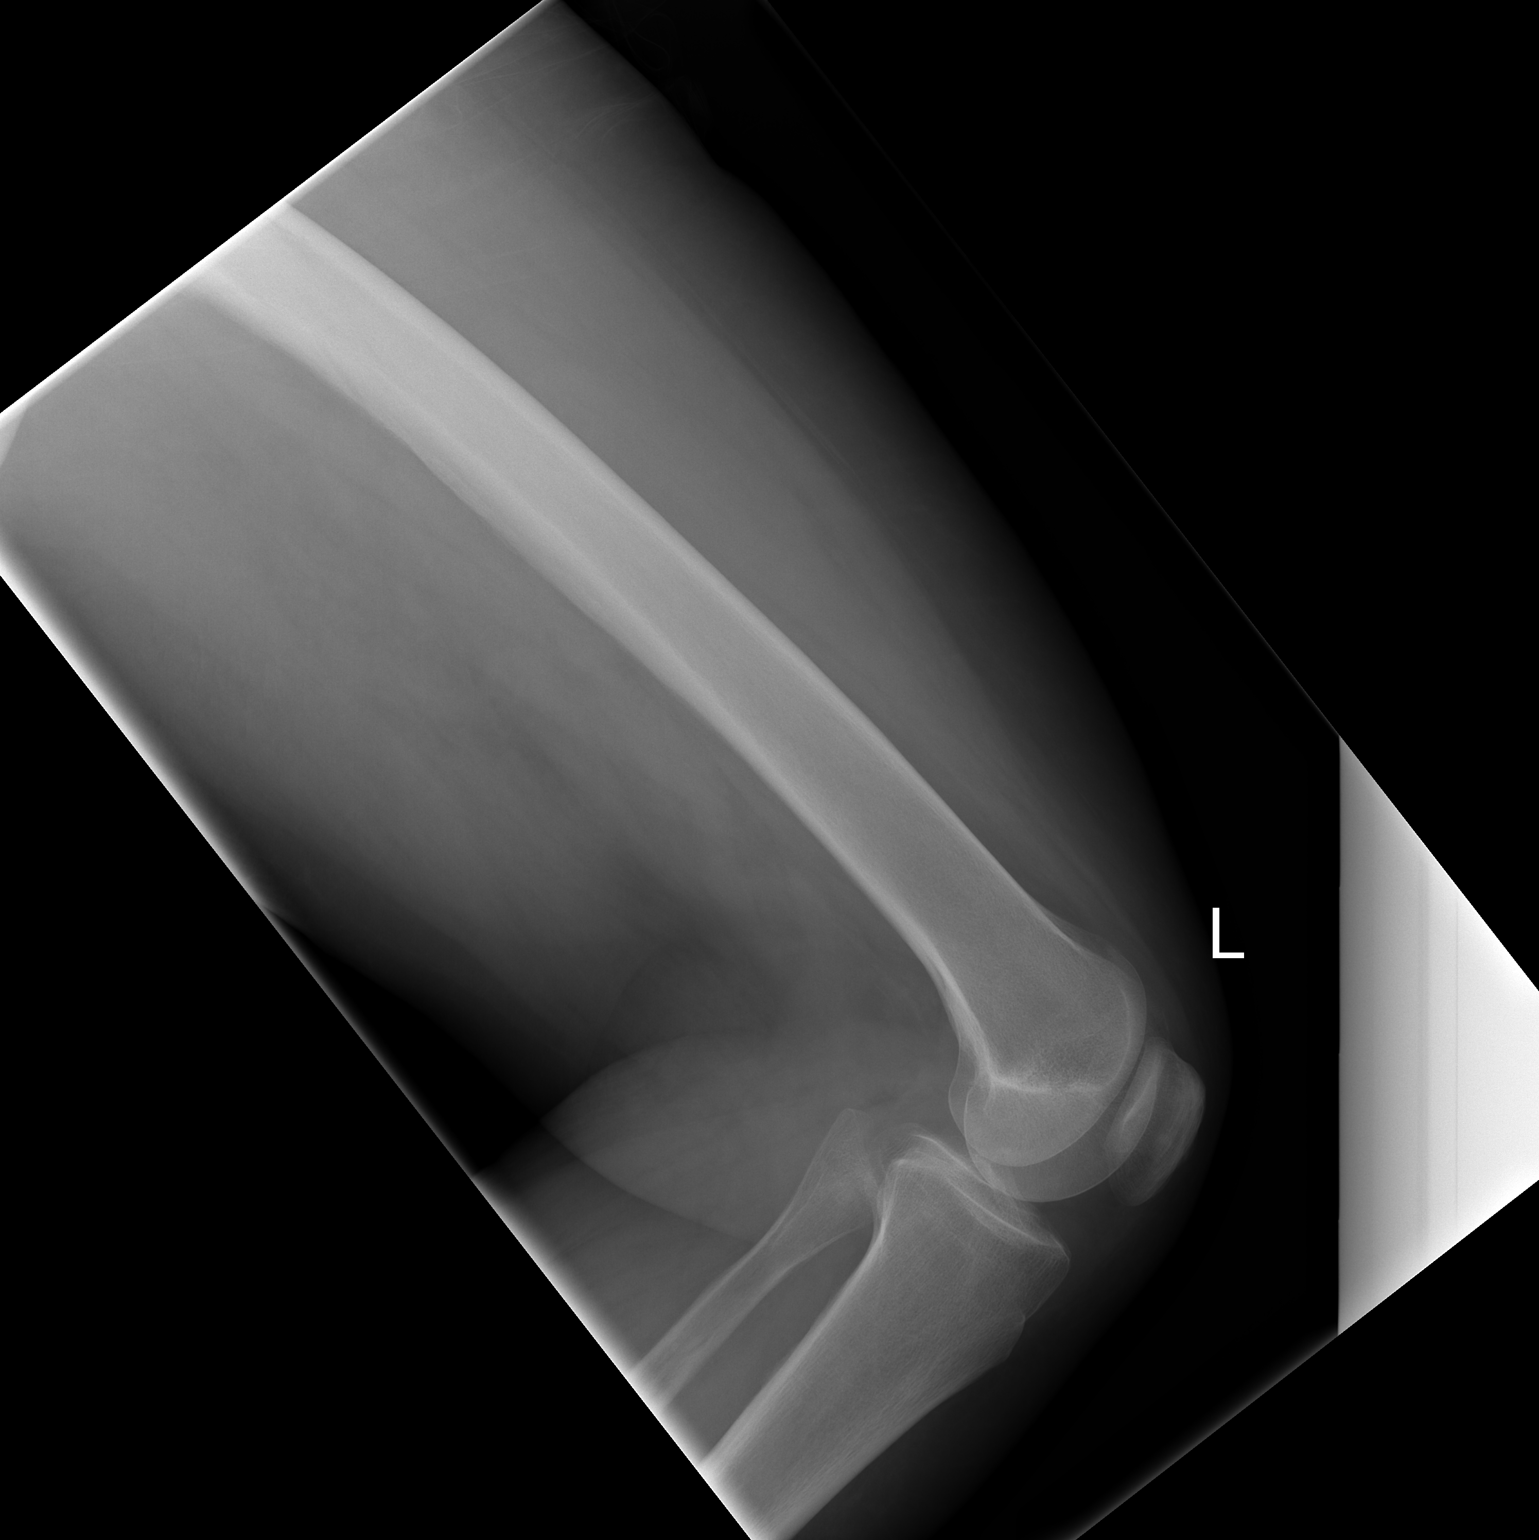

[2 of 2 positions shown; findings below may reference images not displayed]

FINDINGS: There is no evidence of fracture.  No significant soft tissue abnormality.  Probable nutrient foramen of the posterior femur on the lateral view.
IMPRESSION: No plain film evidence of fracture about the mid to distal femur.

## 2006-02-07 ENCOUNTER — Emergency Department (HOSPITAL_COMMUNITY): Admission: EM | Admit: 2006-02-07 | Discharge: 2006-02-07 | Payer: Self-pay | Admitting: Emergency Medicine

## 2006-02-13 ENCOUNTER — Inpatient Hospital Stay (HOSPITAL_COMMUNITY): Admission: AD | Admit: 2006-02-13 | Discharge: 2006-02-19 | Payer: Self-pay | Admitting: Cardiovascular Disease

## 2006-06-23 IMAGING — CR DG THORACIC SPINE 2V
3 series · 3 of 3 positions shown · non-contrast
Comparison: None.
COMPARISON: Two view chest x-ray 07/14/2004.
COMPARISON: None.

CLINICAL DATA: Fell. Injured right shoulder, right hip, and complains of mid
back pain.

RIGHT SHOULDER - 3 VIEW  06/27/2005:

[t t-spine a.p.]
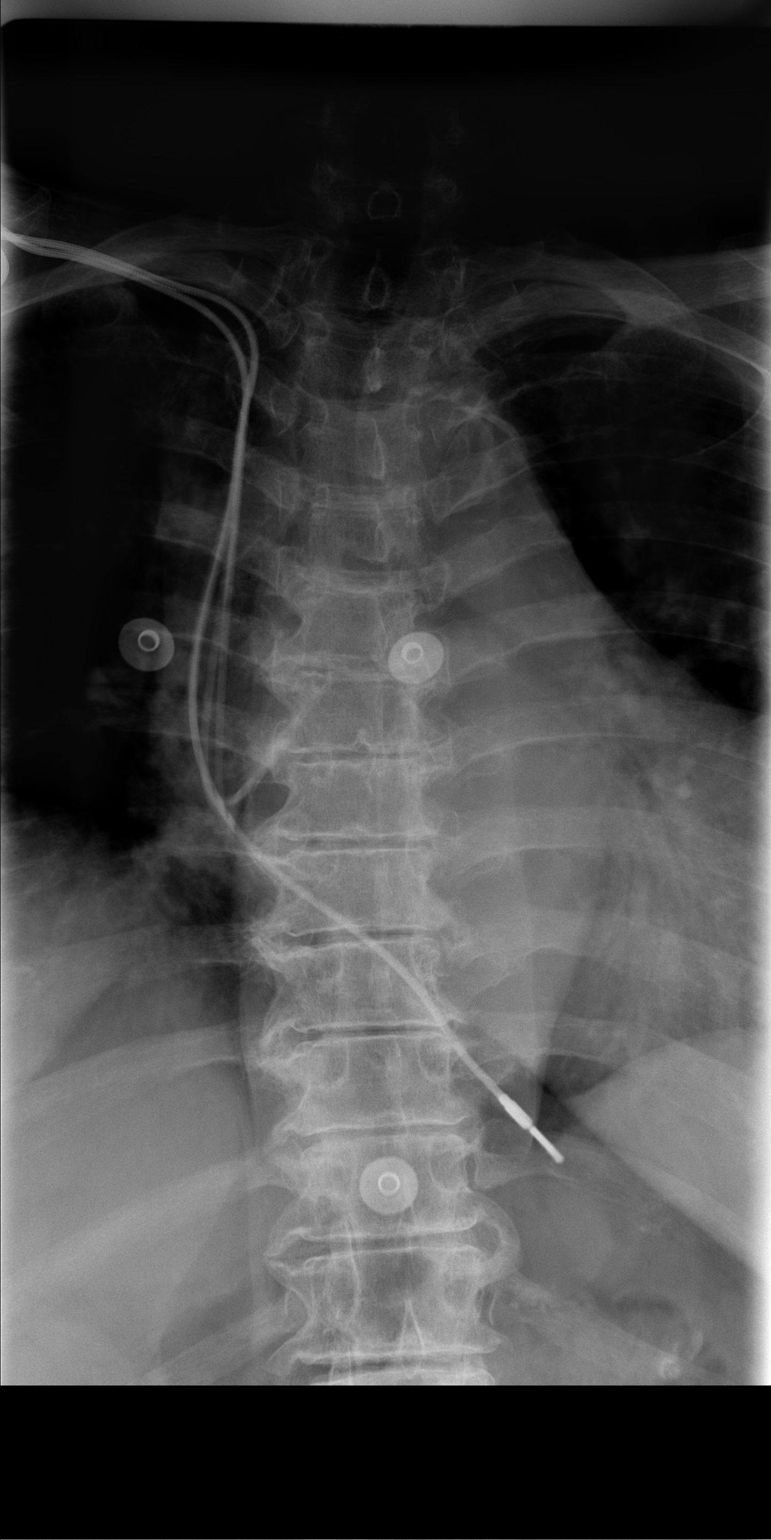

[t t-spine lat]
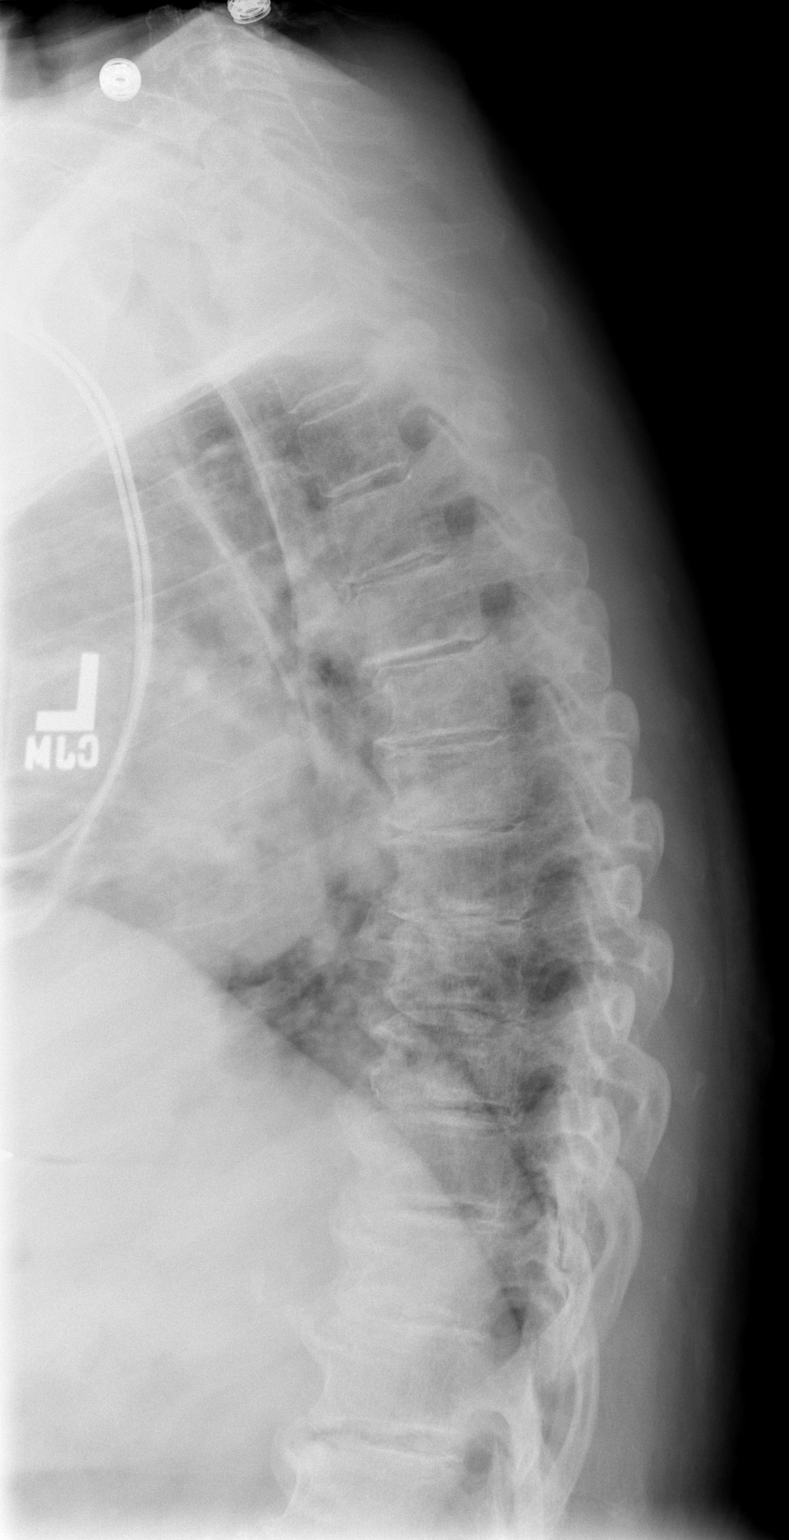

[t swimmers *]
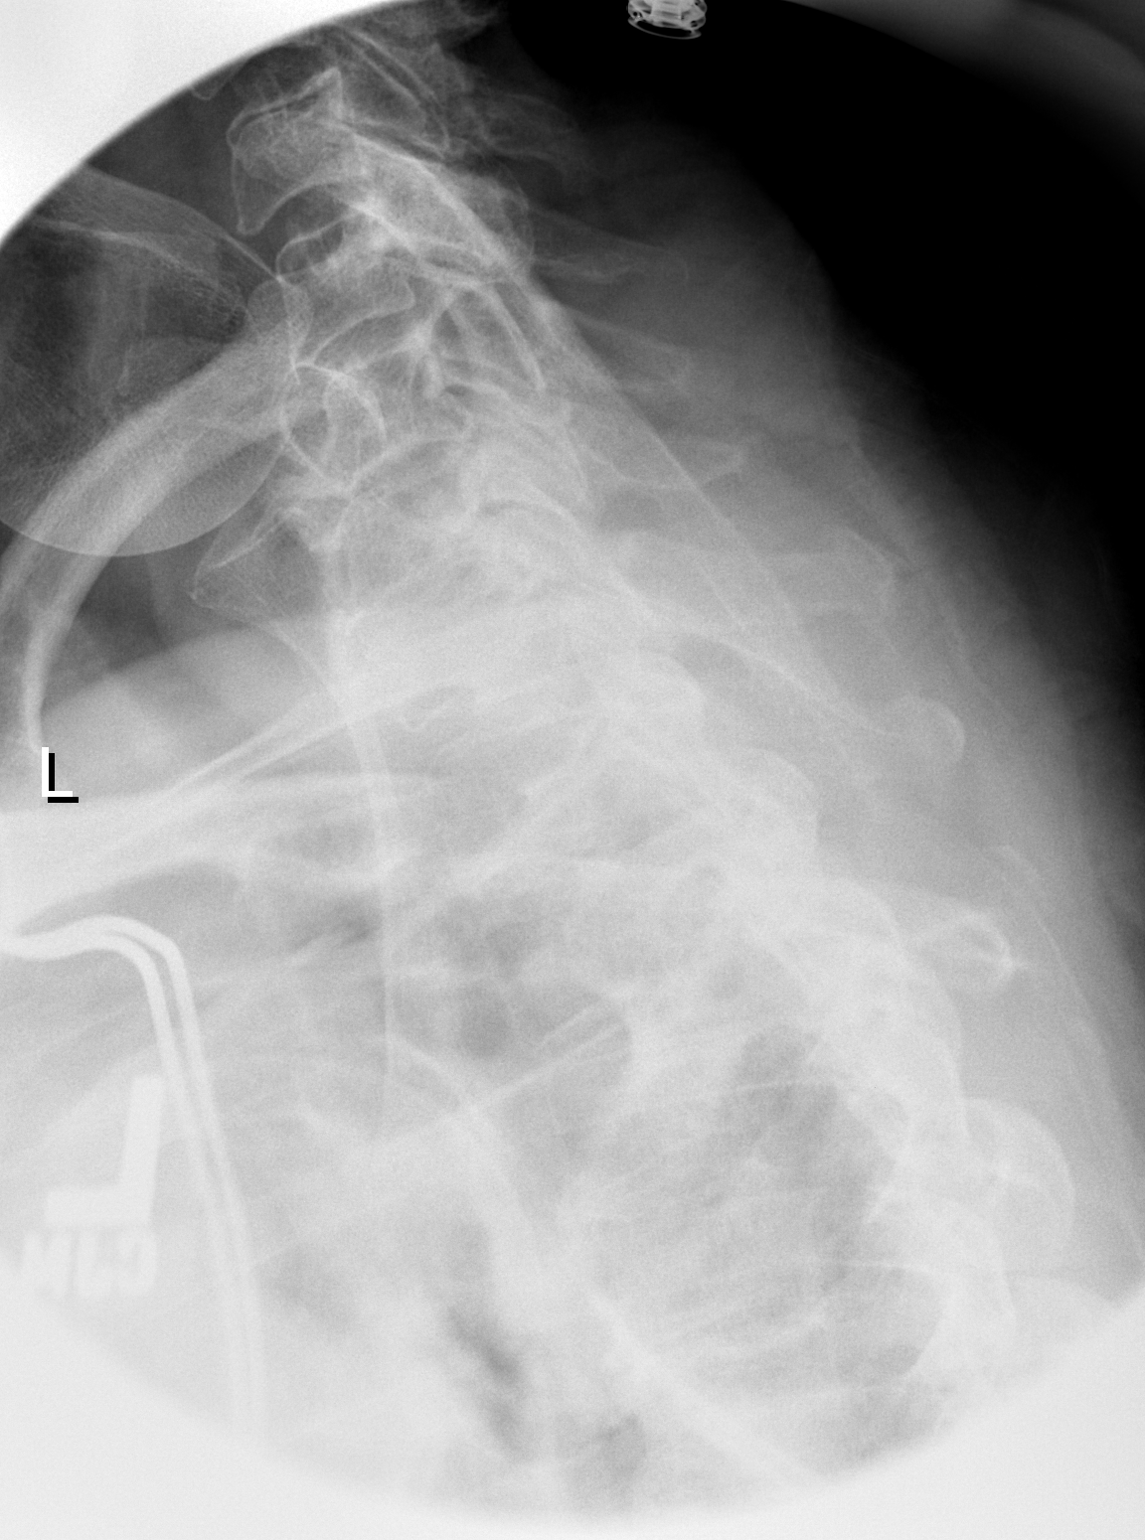

[3 of 3 positions shown; findings below may reference images not displayed]

FINDINGS: There is no evidence for acute fracture or dislocation. There is
narrowing of the subacromial space, with some irregularity at the insertion the
supraspinatus tendon on the greater tuberosity. Mild degenerative changes are
present in the acromioclavicular joint, but there is no evidence of joint
separation. The battery generator pack for the patient's right subclavian
pacemaker obscures part of the scapula.
IMPRESSION: 1. No acute skeletal abnormalities.
2. Findings consistent with chronic supraspinatus tendon disease.

THORACIC SPINE - 3 VIEW 06/27/2005:
FINDINGS: 12 rib-bearing thoracic vertebrae demonstrate anatomic alignment. No
fractures are identified. Disc space narrowing with endplate hypertrophic
changes are present essentially every thoracic level from T3-T4 through T11-T12.
There are large osteophytes involving the lower thoracic spine. The pedicles
appear intact.
IMPRESSION: 1. No acute skeletal abnormalities.
2. Moderate to severe diffuse thoracic spondylosis. 

RIGHT HIP - 2 VIEW  06/27/2005:
FINDINGS: There is no evidence of acute or subacute fracture or dislocation.
The joint space is well-preserved. There are no intrinsic osseous abnormalities.

The included AP pelvis shows the sacroiliac joints and symphysis pubis to be
intact. No intrinsic abnormalities are identified in the bony pelvis.
Degenerative changes are noted in the visualized lower lumbar spine.
IMPRESSION: Normal right hip.

## 2006-06-23 IMAGING — CR DG SHOULDER 2+V*R*
3 series · 3 of 3 positions shown · non-contrast
Comparison: None.
COMPARISON: Two view chest x-ray 07/14/2004.
COMPARISON: None.

CLINICAL DATA: Fell. Injured right shoulder, right hip, and complains of mid
back pain.

RIGHT SHOULDER - 3 VIEW  06/27/2005:

[w shoulder ap internal righ]
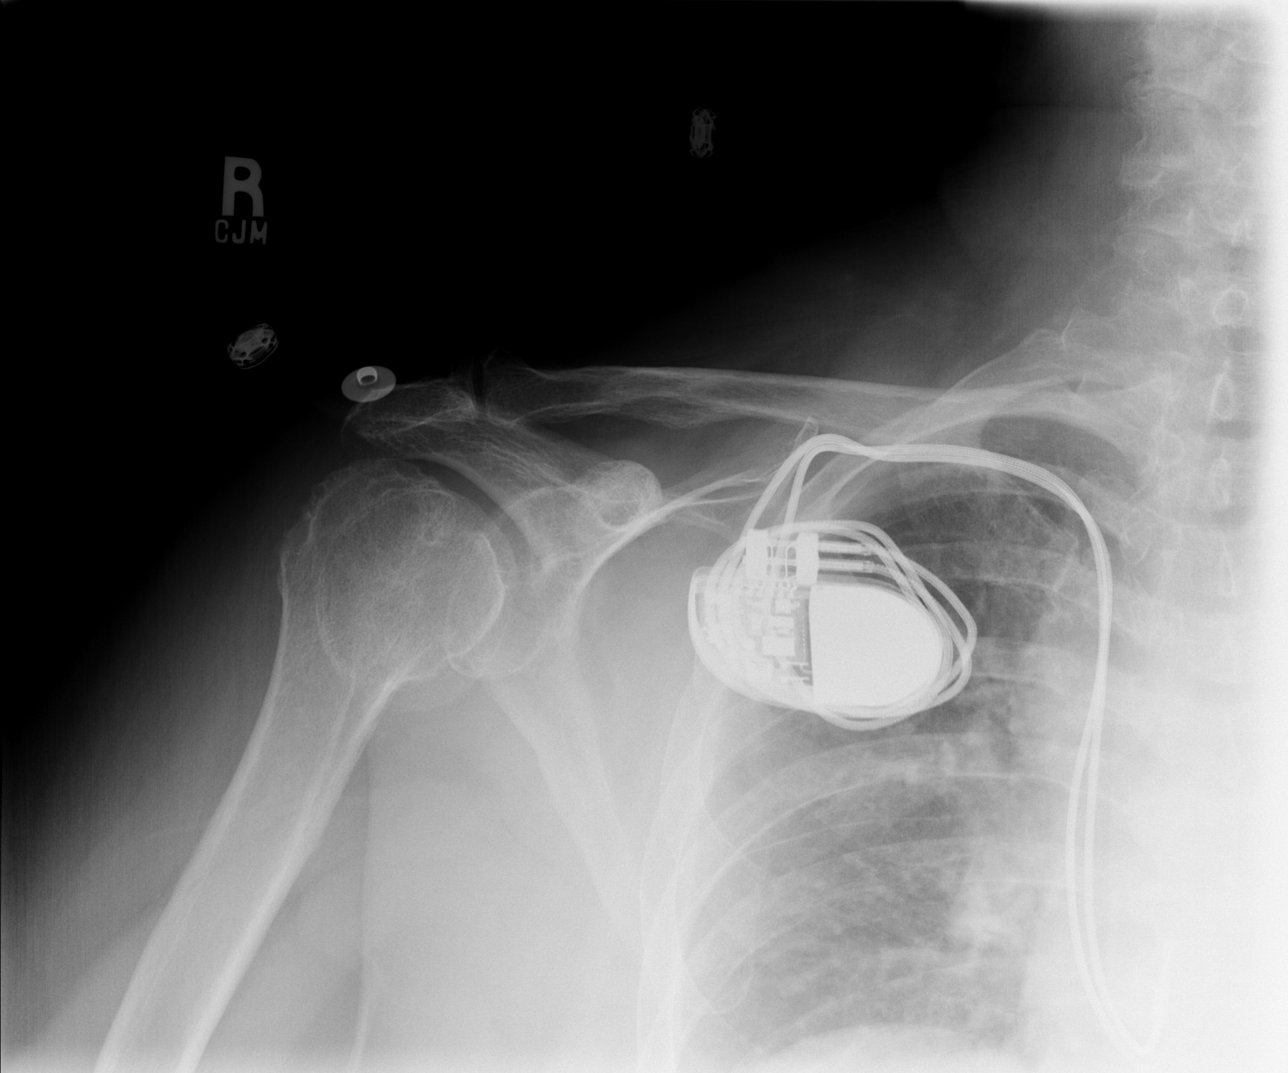

[w shoulder ap external righ]
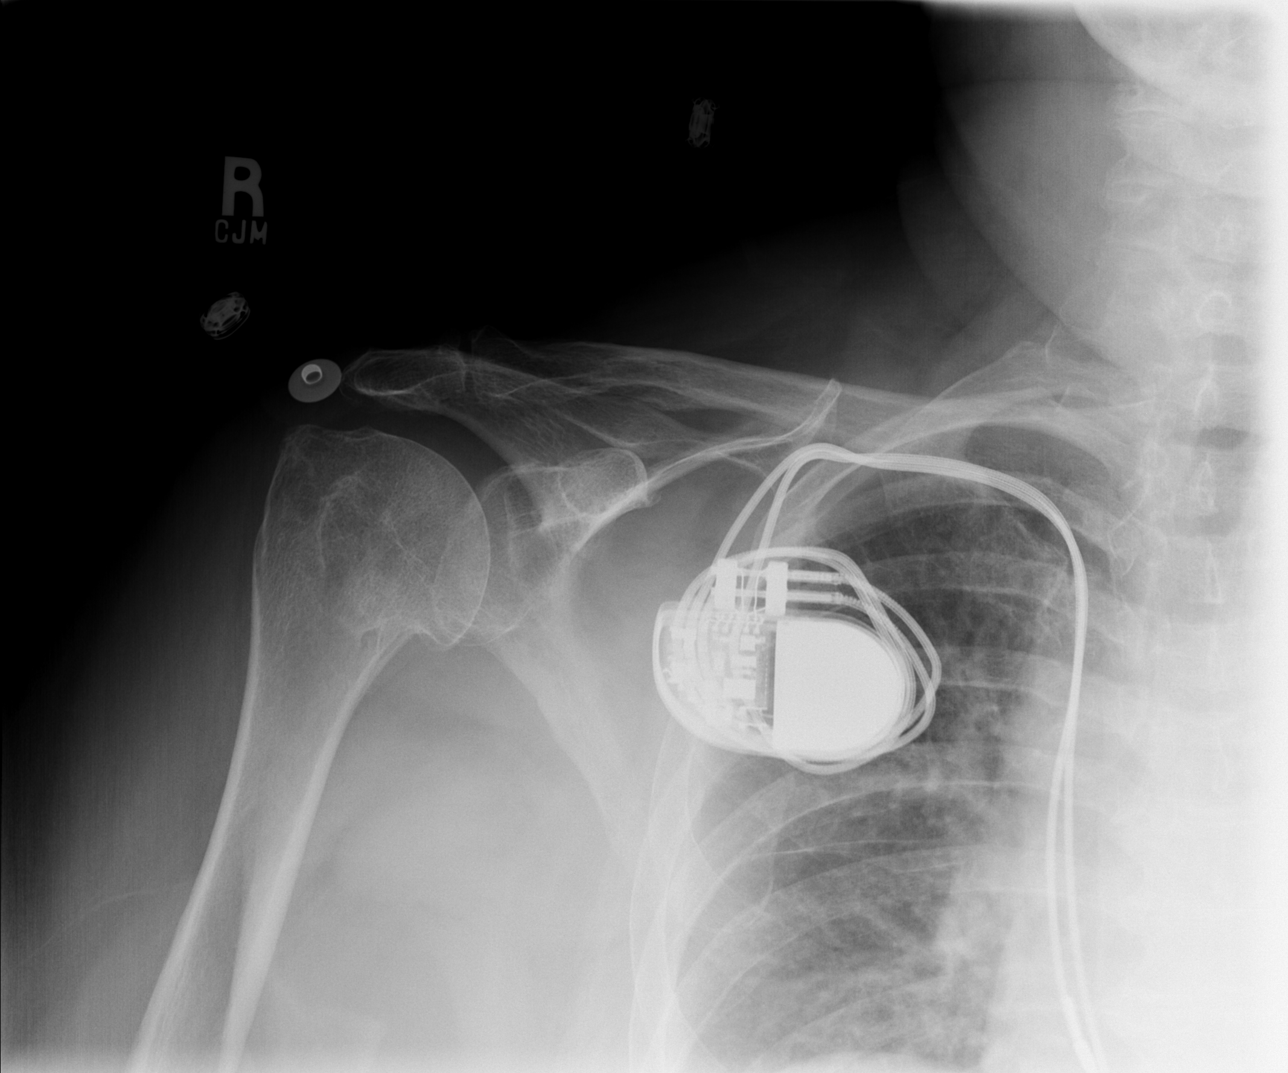

[w shoulder y view right]
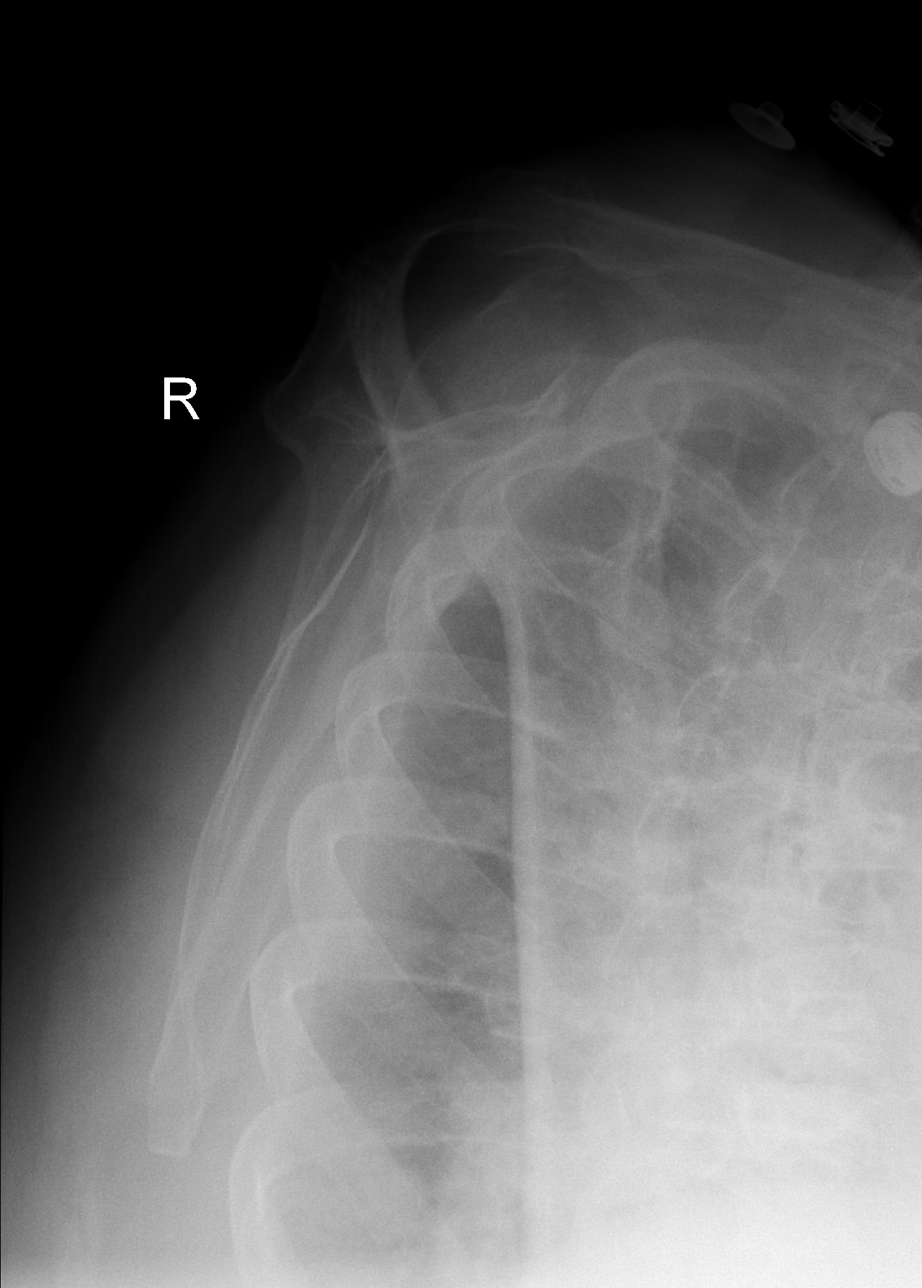

[3 of 3 positions shown; findings below may reference images not displayed]

FINDINGS: There is no evidence for acute fracture or dislocation. There is
narrowing of the subacromial space, with some irregularity at the insertion the
supraspinatus tendon on the greater tuberosity. Mild degenerative changes are
present in the acromioclavicular joint, but there is no evidence of joint
separation. The battery generator pack for the patient's right subclavian
pacemaker obscures part of the scapula.
IMPRESSION: 1. No acute skeletal abnormalities.
2. Findings consistent with chronic supraspinatus tendon disease.

THORACIC SPINE - 3 VIEW 06/27/2005:
FINDINGS: 12 rib-bearing thoracic vertebrae demonstrate anatomic alignment. No
fractures are identified. Disc space narrowing with endplate hypertrophic
changes are present essentially every thoracic level from T3-T4 through T11-T12.
There are large osteophytes involving the lower thoracic spine. The pedicles
appear intact.
IMPRESSION: 1. No acute skeletal abnormalities.
2. Moderate to severe diffuse thoracic spondylosis. 

RIGHT HIP - 2 VIEW  06/27/2005:
FINDINGS: There is no evidence of acute or subacute fracture or dislocation.
The joint space is well-preserved. There are no intrinsic osseous abnormalities.

The included AP pelvis shows the sacroiliac joints and symphysis pubis to be
intact. No intrinsic abnormalities are identified in the bony pelvis.
Degenerative changes are noted in the visualized lower lumbar spine.
IMPRESSION: Normal right hip.

## 2006-06-23 IMAGING — CR DG HIP COMPLETE 2+V*R*
3 series · 3 of 3 positions shown · non-contrast
Comparison: None.
COMPARISON: Two view chest x-ray 07/14/2004.
COMPARISON: None.

CLINICAL DATA: Fell. Injured right shoulder, right hip, and complains of mid
back pain.

RIGHT SHOULDER - 3 VIEW  06/27/2005:

[t pelvis a.p.]
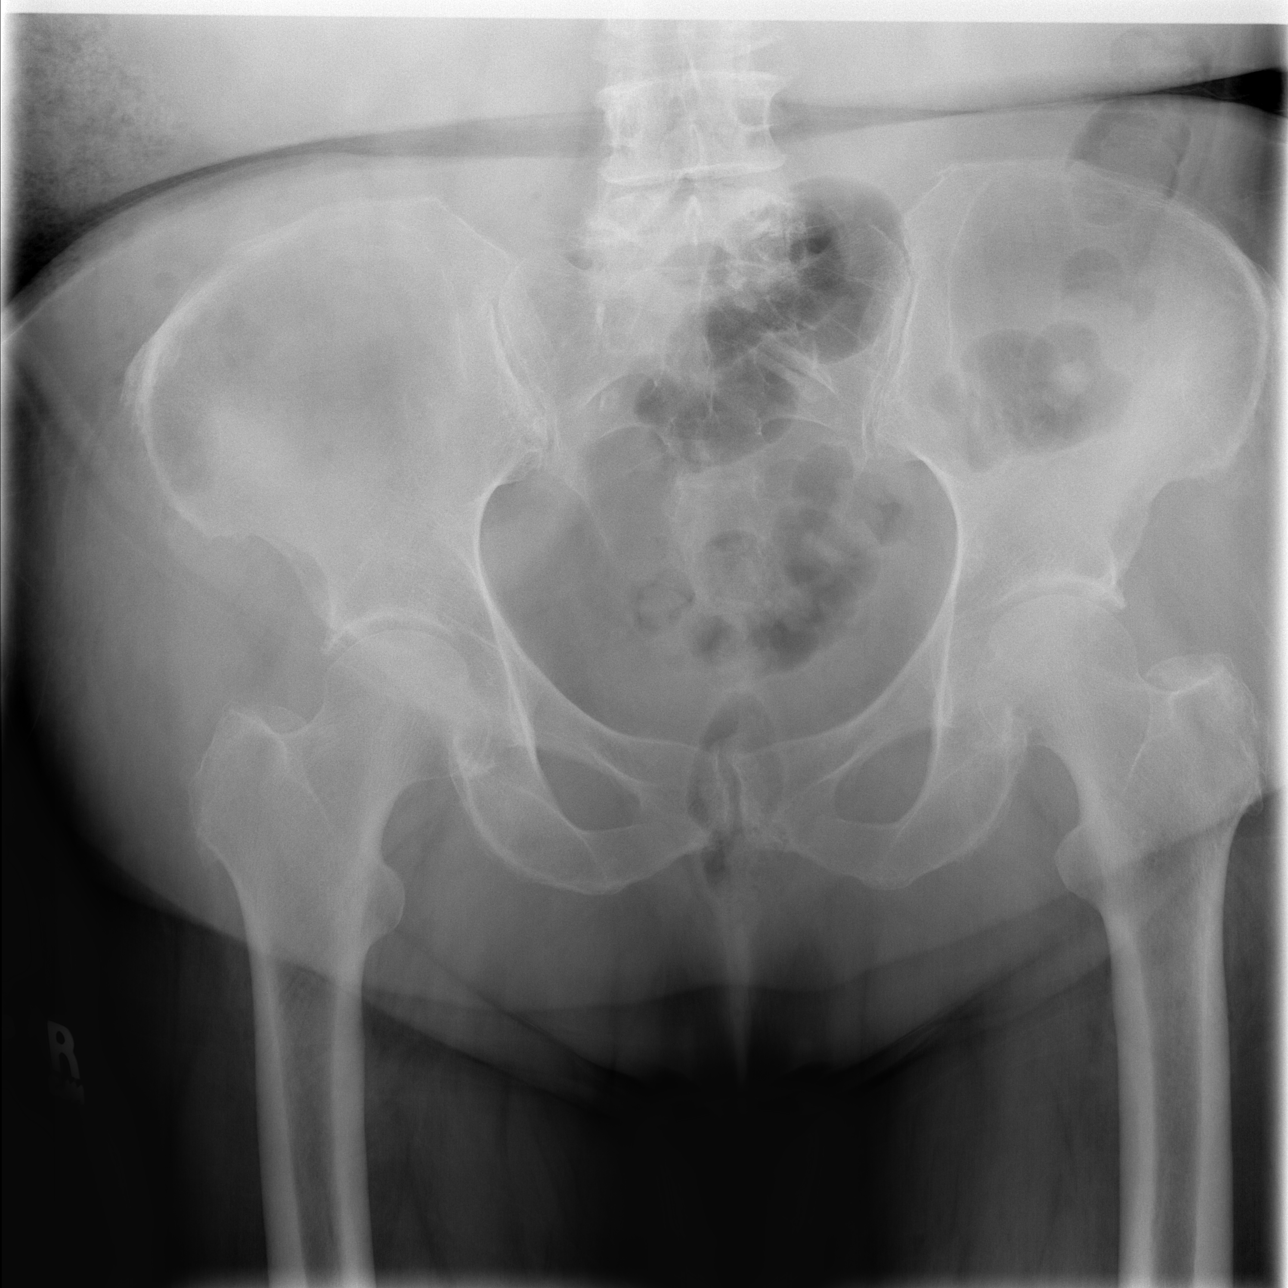

[t hip ap right]
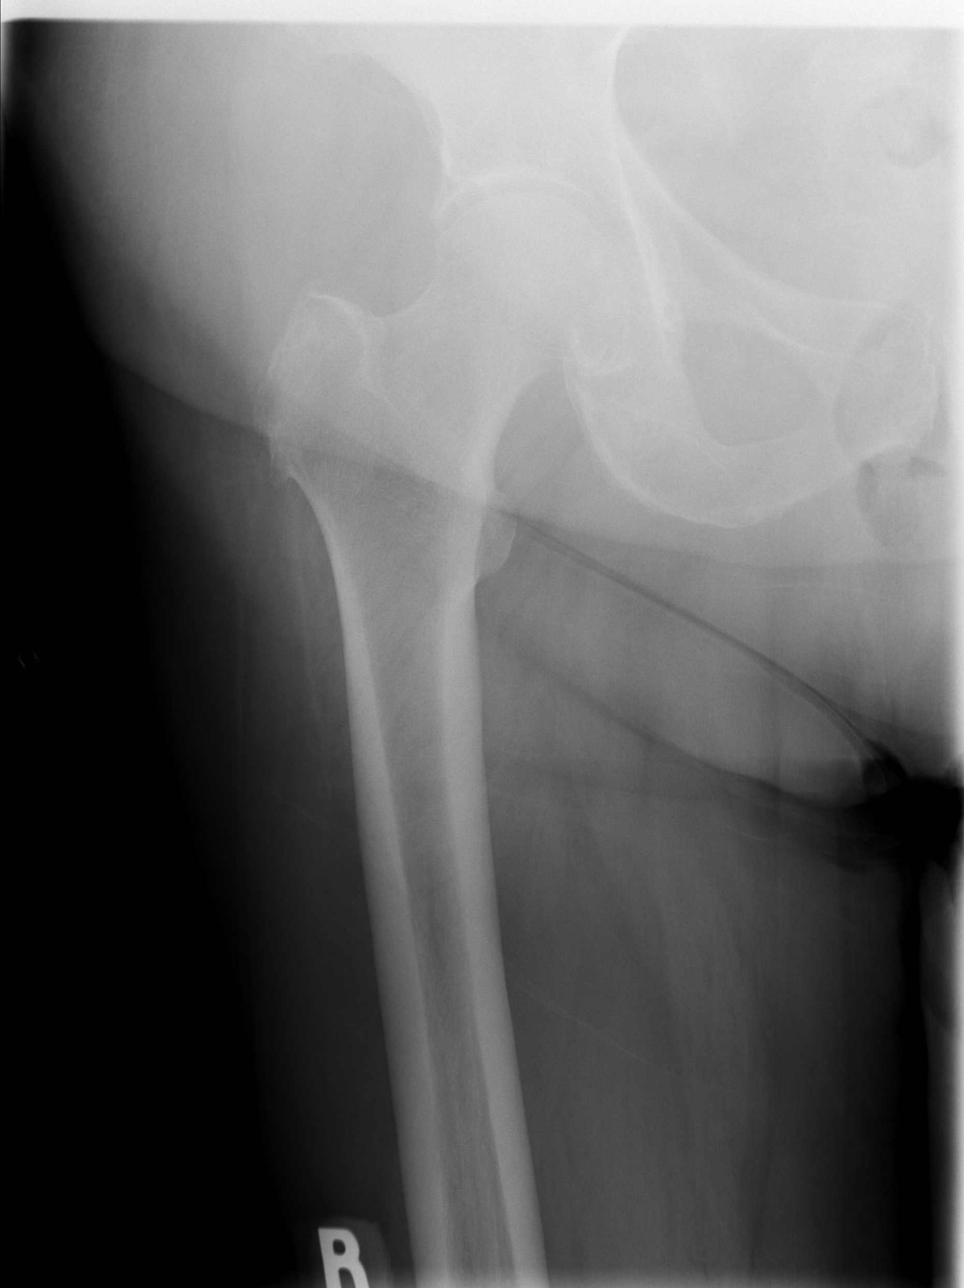

[t hip frog leg right]
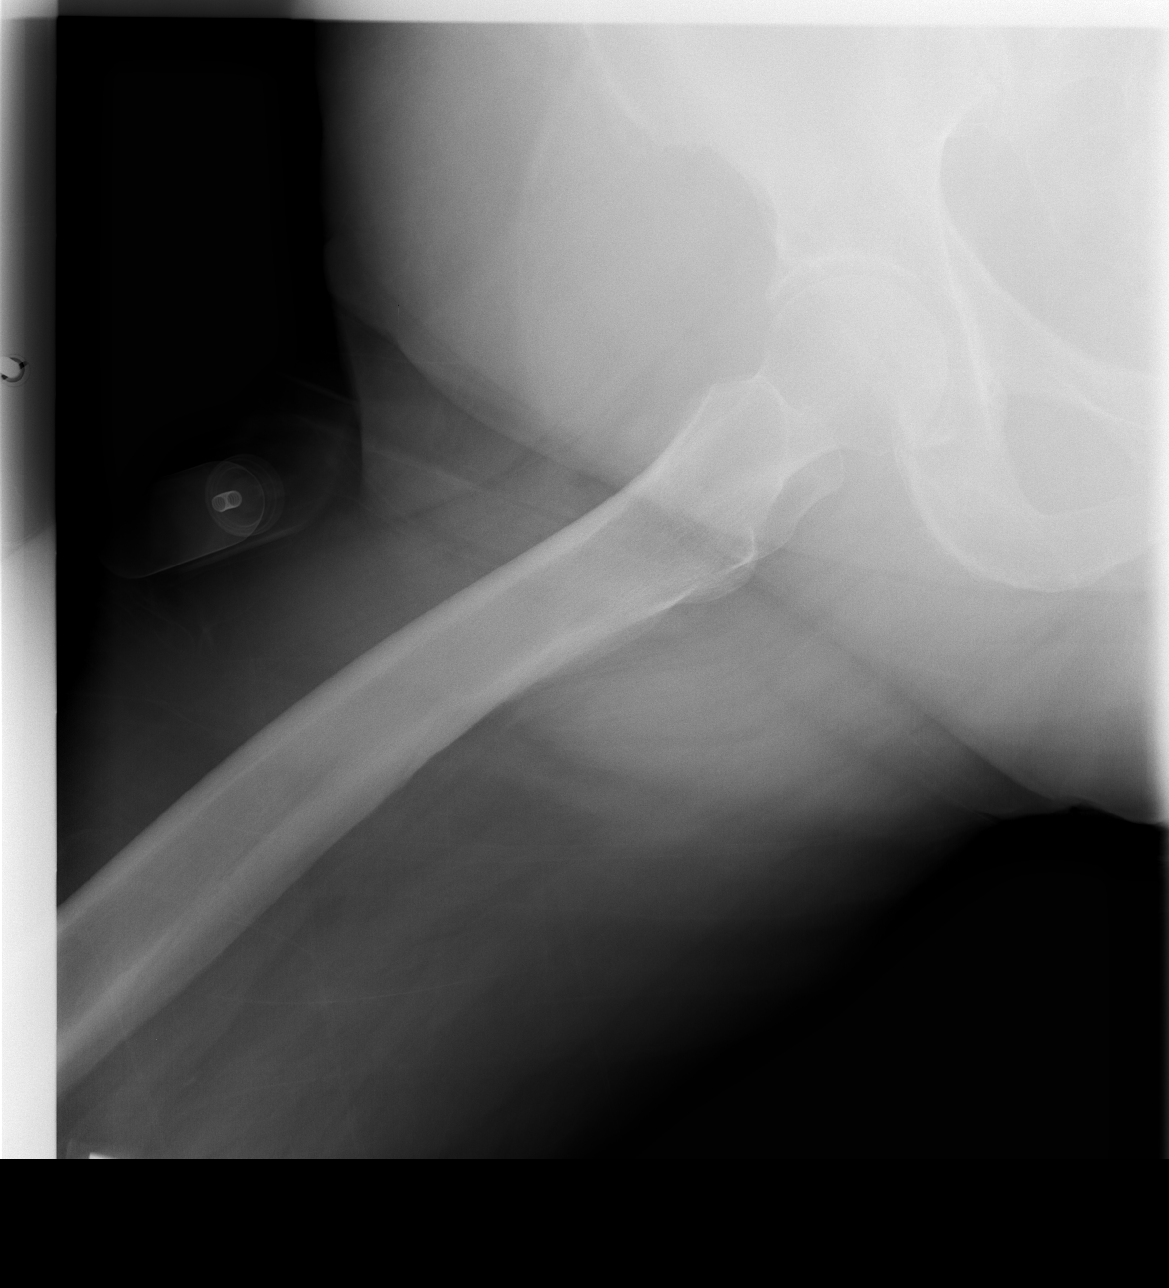

[3 of 3 positions shown; findings below may reference images not displayed]

FINDINGS: There is no evidence for acute fracture or dislocation. There is
narrowing of the subacromial space, with some irregularity at the insertion the
supraspinatus tendon on the greater tuberosity. Mild degenerative changes are
present in the acromioclavicular joint, but there is no evidence of joint
separation. The battery generator pack for the patient's right subclavian
pacemaker obscures part of the scapula.
IMPRESSION: 1. No acute skeletal abnormalities.
2. Findings consistent with chronic supraspinatus tendon disease.

THORACIC SPINE - 3 VIEW 06/27/2005:
FINDINGS: 12 rib-bearing thoracic vertebrae demonstrate anatomic alignment. No
fractures are identified. Disc space narrowing with endplate hypertrophic
changes are present essentially every thoracic level from T3-T4 through T11-T12.
There are large osteophytes involving the lower thoracic spine. The pedicles
appear intact.
IMPRESSION: 1. No acute skeletal abnormalities.
2. Moderate to severe diffuse thoracic spondylosis. 

RIGHT HIP - 2 VIEW  06/27/2005:
FINDINGS: There is no evidence of acute or subacute fracture or dislocation.
The joint space is well-preserved. There are no intrinsic osseous abnormalities.

The included AP pelvis shows the sacroiliac joints and symphysis pubis to be
intact. No intrinsic abnormalities are identified in the bony pelvis.
Degenerative changes are noted in the visualized lower lumbar spine.
IMPRESSION: Normal right hip.

## 2006-06-30 ENCOUNTER — Ambulatory Visit (HOSPITAL_BASED_OUTPATIENT_CLINIC_OR_DEPARTMENT_OTHER): Admission: RE | Admit: 2006-06-30 | Discharge: 2006-06-30 | Payer: Self-pay | Admitting: Otolaryngology

## 2006-07-06 ENCOUNTER — Ambulatory Visit: Payer: Self-pay | Admitting: Internal Medicine

## 2006-10-18 IMAGING — CT CT CHEST W/O CM
3 of 4 series · 17 of 31 positions shown, 19 images · IV contrast (CONTRAST)
Comparison: NONE

CLINICAL DATA: Dyspnea, cough. 

CT CHEST  WITH INTRAVENOUS CONTRAST
TECHNIQUE: Multiple axial slices were obtained from the lung apex 
through the upper abdomen.  75 cc of Optiray 350 was injected at a 
rate of 2 cc per second.  Lung, soft tissue, and bone window 
settings were obtained.

[Series 2: with · axial · 0.73mm/px · z∈[+1403,+1628]mm · 7 of 63 slices shown]
[im 9/63  lung]
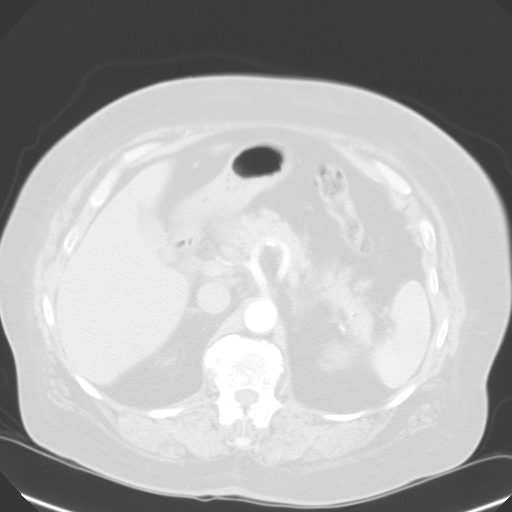
[im 18/63  lung]
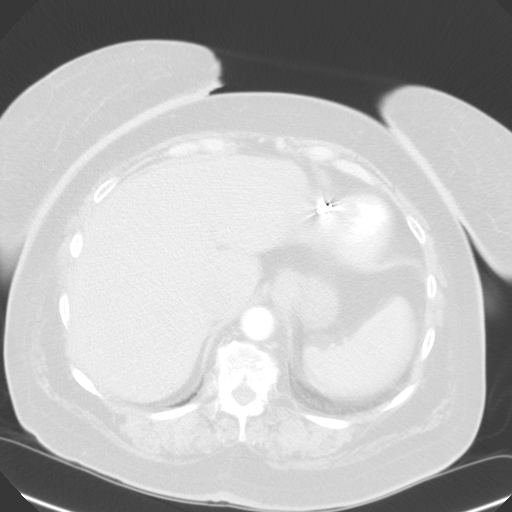
[im 27/63  lung]
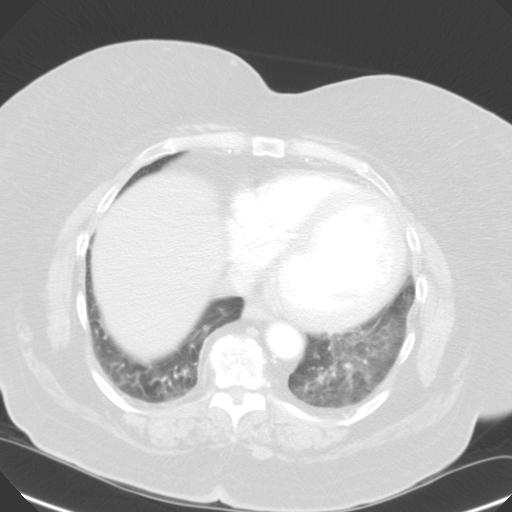
[im 30/63  lung]
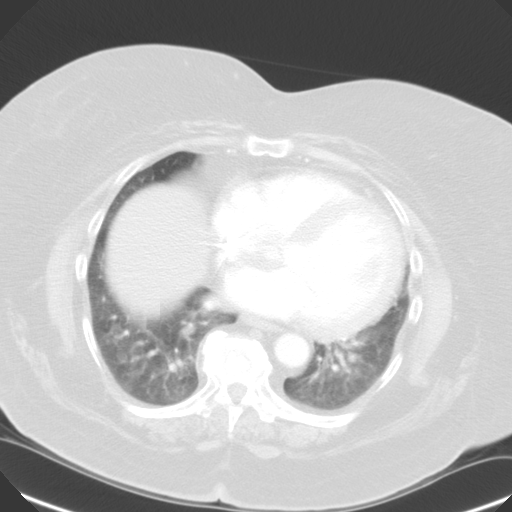
[im 36/63  lung]
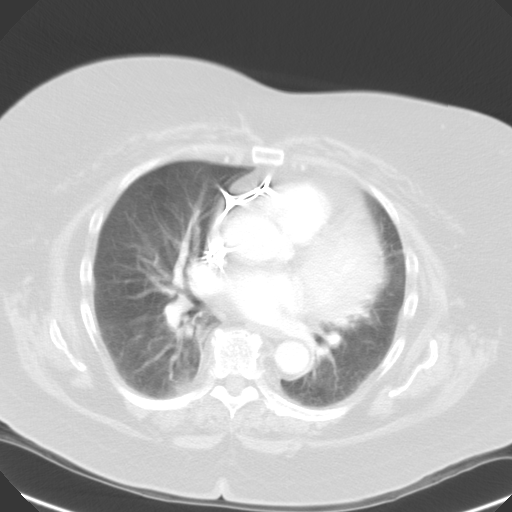
[im 45/63  lung]
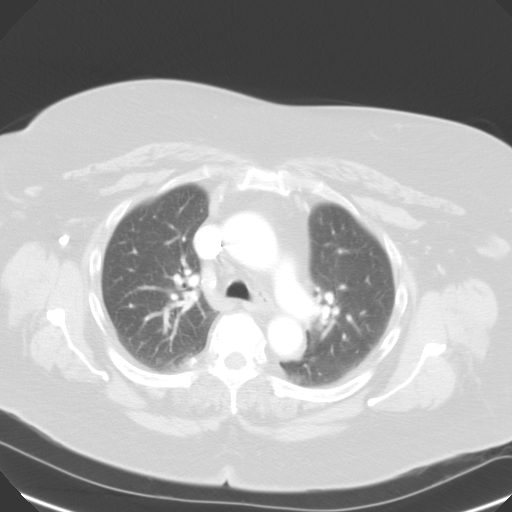
[im 54/63  lung]
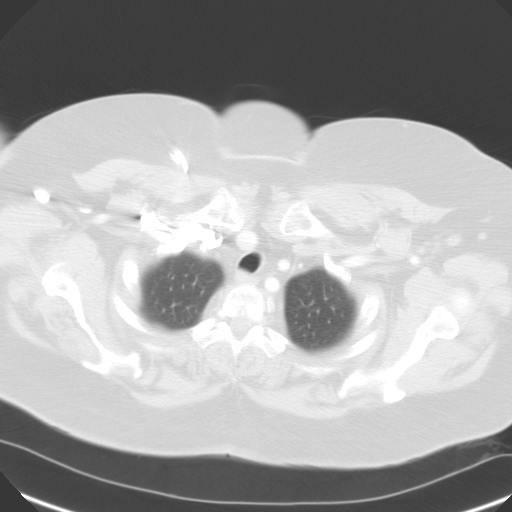

[Series 4: liver · axial · 0.73mm/px · z∈[+1408,+1453]mm · 2 of 28 slices shown]
[im 10/28  lung]
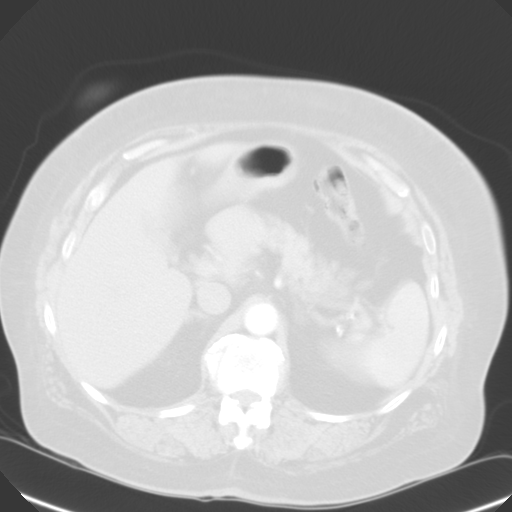
[im 19/28  lung]
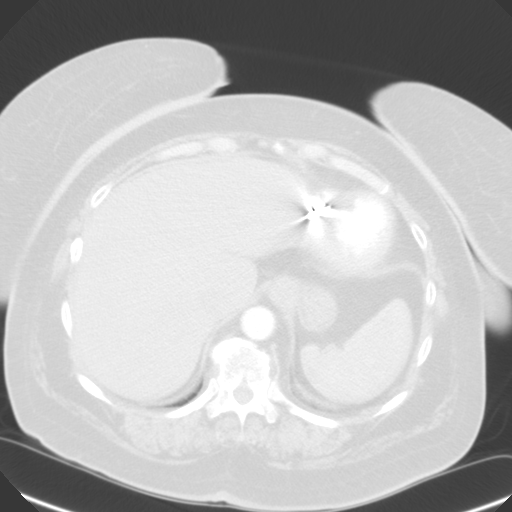

[Series 5: bone · axial · 0.73mm/px · z∈[+1398,+1633]mm · 8 of 63 slices shown, 10 images]
[im 8/63  mediastinal]
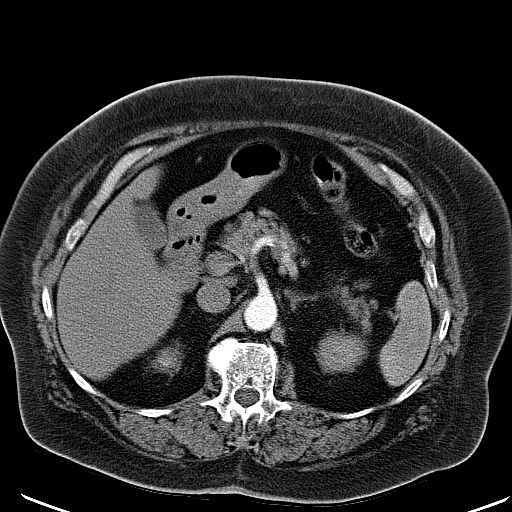
[im 8/63  lung]
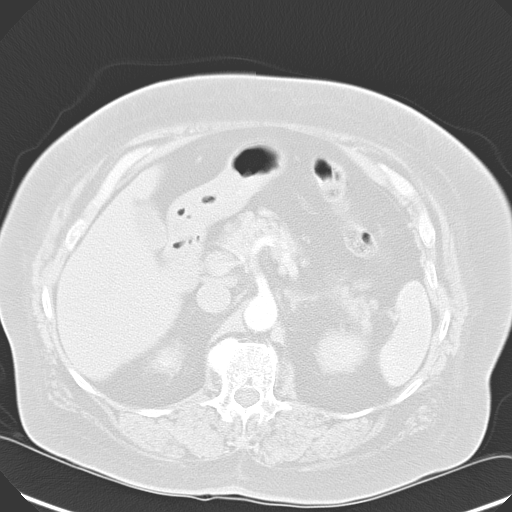
[im 16/63  lung]
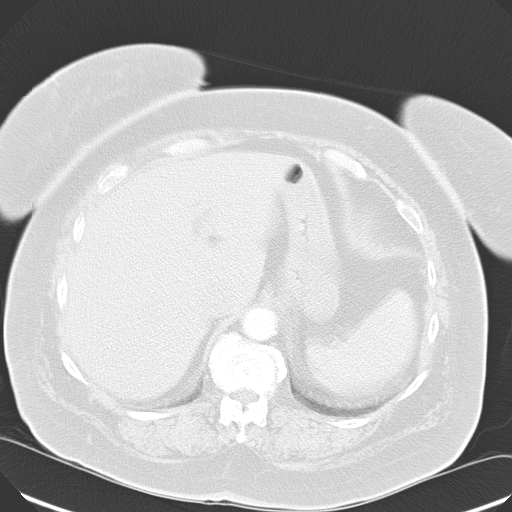
[im 24/63  lung]
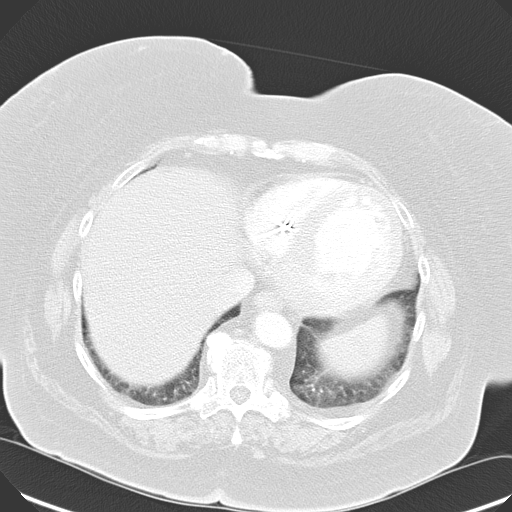
[im 30/63  lung]
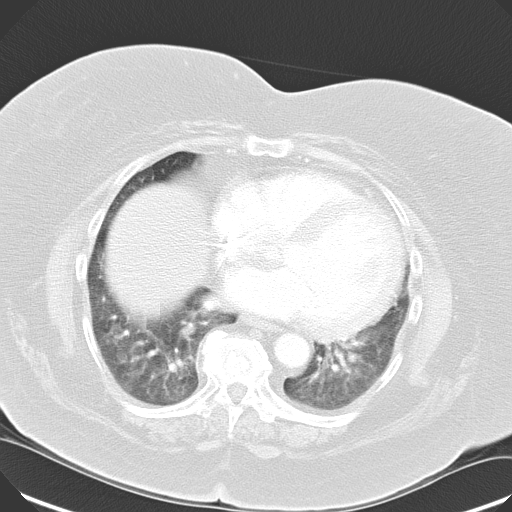
[im 32/63  mediastinal]
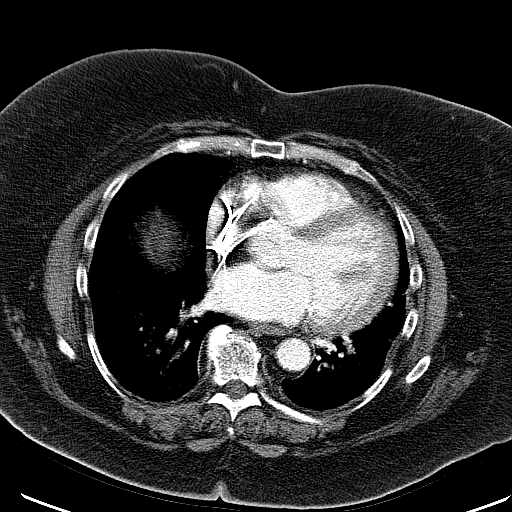
[im 32/63  lung]
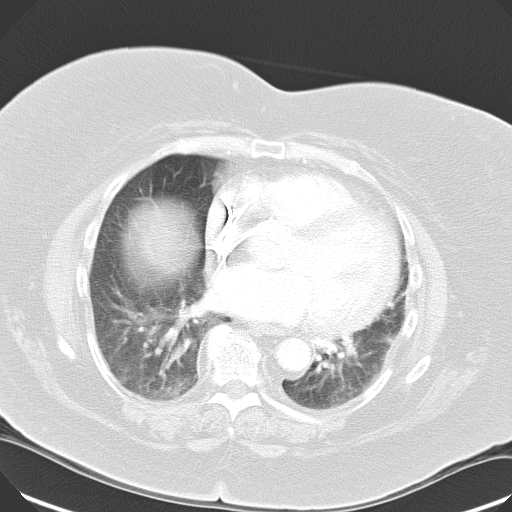
[im 39/63  lung]
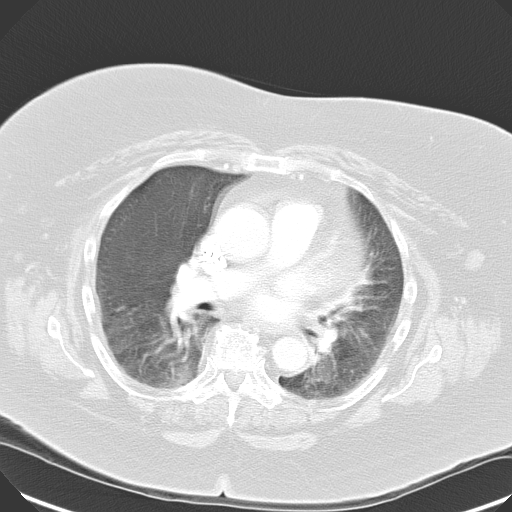
[im 47/63  lung]
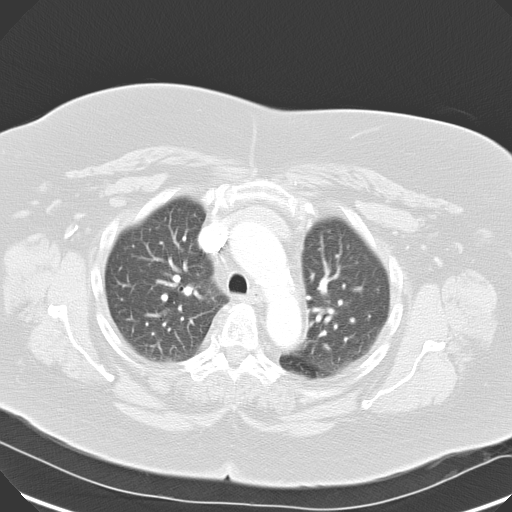
[im 55/63  lung]
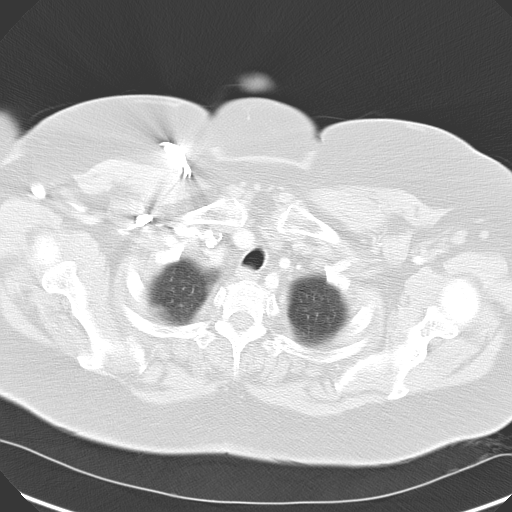

[17 of 31 positions shown; findings below may reference images not displayed]

FINDINGS: Obesity is noted with basilar underaeration, probably 
secondary to obesity. Heart is moderately enlarged.  Right-sided 
pacemaker is in place.  No pericardial or pleural effusions.  The 
thoracic aorta is normal in caliber.  No pulmonary infiltrates or 
masses.  No large central pulmonary emboli.  No acute bony 
changes.  No adrenal mass.
IMPRESSION: No acute abnormality.  Cardiomegaly and pacemaker. 
Dict Date: 10/22/2005  Tran Date: 10/22/2005 [facility]  JLM

## 2006-10-29 ENCOUNTER — Ambulatory Visit: Payer: Self-pay | Admitting: Internal Medicine

## 2006-10-29 ENCOUNTER — Inpatient Hospital Stay (HOSPITAL_COMMUNITY): Admission: EM | Admit: 2006-10-29 | Discharge: 2006-11-04 | Payer: Self-pay | Admitting: Emergency Medicine

## 2006-11-05 ENCOUNTER — Emergency Department (HOSPITAL_COMMUNITY): Admission: EM | Admit: 2006-11-05 | Discharge: 2006-11-05 | Payer: Self-pay | Admitting: Emergency Medicine

## 2006-11-06 ENCOUNTER — Encounter: Payer: Self-pay | Admitting: Internal Medicine

## 2006-11-06 ENCOUNTER — Ambulatory Visit: Payer: Self-pay

## 2006-11-07 IMAGING — CT CT CERVICAL SPINE W/ CM
4 series · 15 of 33 positions shown, 18 images · IV contrast (omnipaque)
Comparison: none

CLINICAL DATA: Severe neck pain. 
CERVICAL MYELOGRAM: 
The patient?s myelogram was technically difficult because she was above ideal body weight and she was short of breath requiring an inhaler before we started.  The patient was placed prone on the table which was difficult for her.   Following informed consent, sterile preparation of the back, and adequate local anesthesia, a lumbar puncture was performed using a 22 gauge spinal needle at L5-S1 from a left paramedian approach.  Fluid was clear and colorless.  9 cc of Omnipaque 300 were instilled in the subarachnoid space.  The patient was tilted head down 20? for 60 seconds.  Unfortunately, the patient became short of breath during this maneuver.  Limited cervical films obtained show an extradural defect ventrally at C4-5.  There is no definite C5 nerve root encroachment, however.
TECHNIQUE: Multidetector CT imaging of the cervical spine was performed after intrathecal injection of contrast.  Multiplanar CT image reconstructions were also generated.

[Series 2: cervical spine · axial · 0.23mm/px · z∈[-283,-140]mm · 5 of 85 slices shown, 7 images]
[im 15/85  soft-tissue]
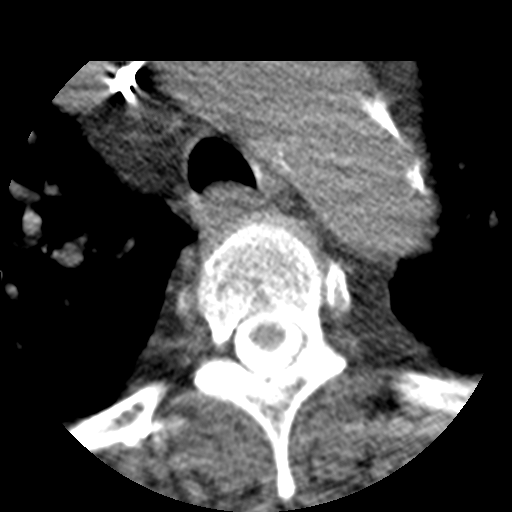
[im 15/85  bone]
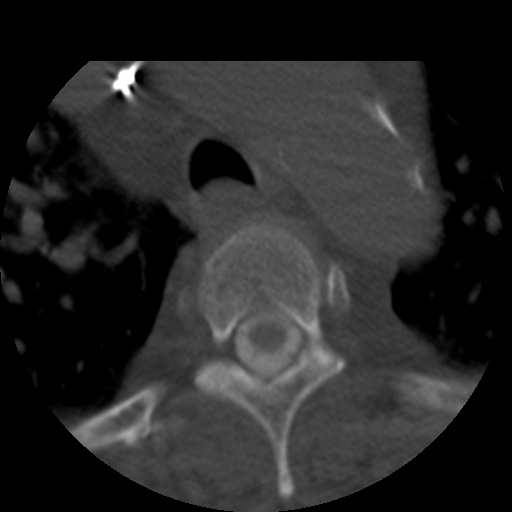
[im 29/85  bone]
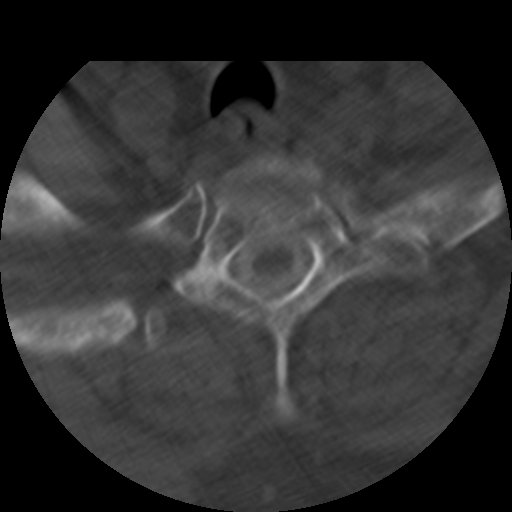
[im 43/85  bone]
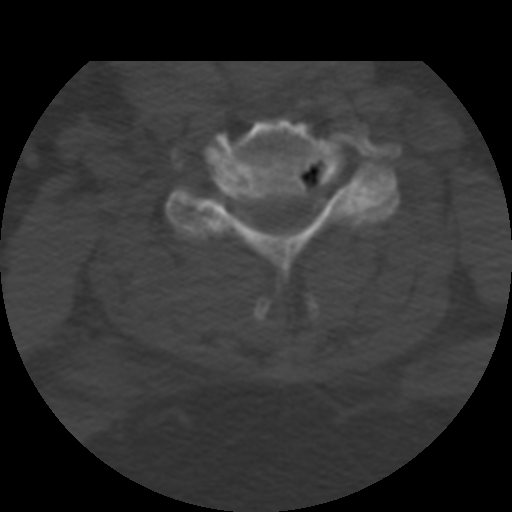
[im 57/85  bone]
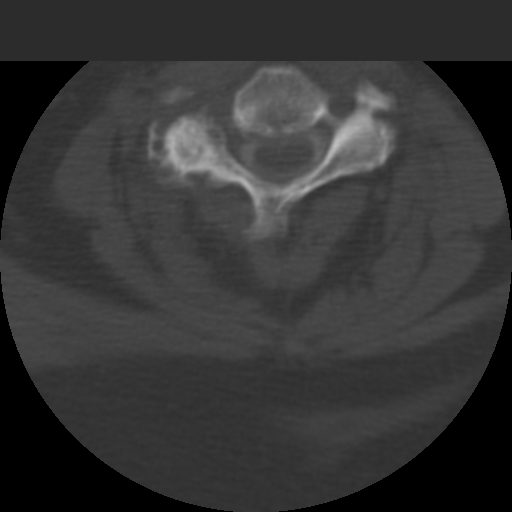
[im 71/85  soft-tissue]
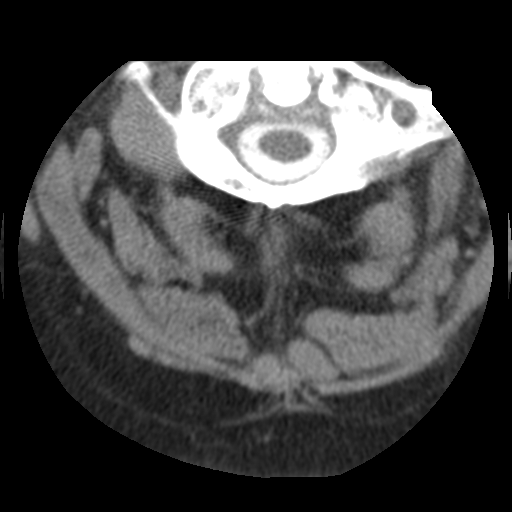
[im 71/85  bone]
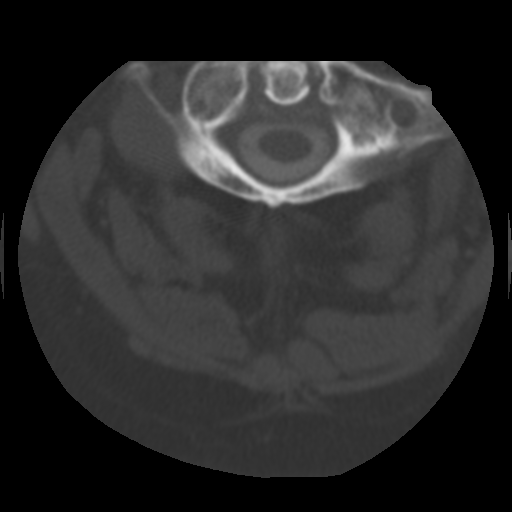

[Series 3: recon 2: cervical spine · axial · 0.23mm/px · z∈[-283,-248]mm · 2 of 86 slices shown]
[im 15/86  bone]
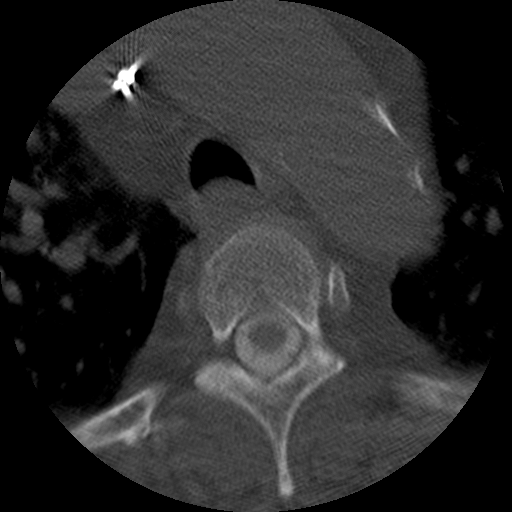
[im 29/86  bone]
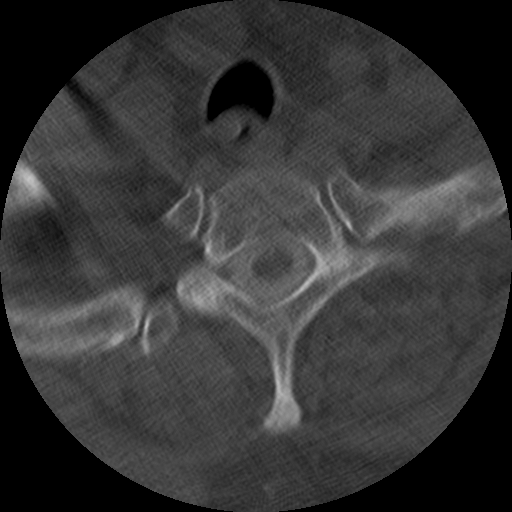

[Series 400: reformatted · coronal · 0.43mm/px · 3 of 40 slices shown (1 of 2)]
[im 8/40  bone]
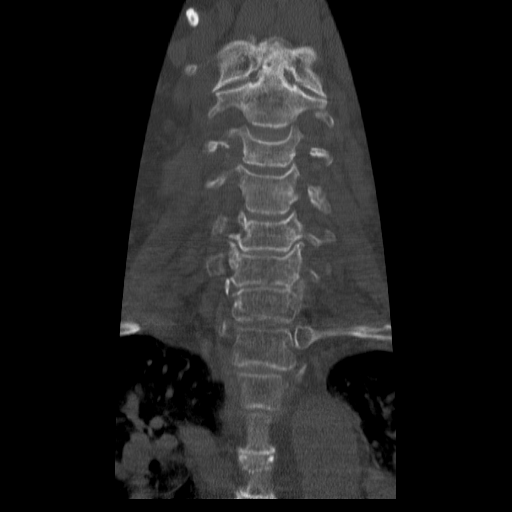
[im 16/40  bone]
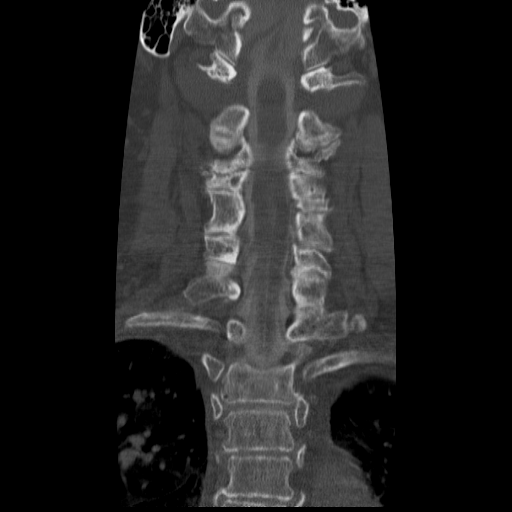
[im 24/40  bone]
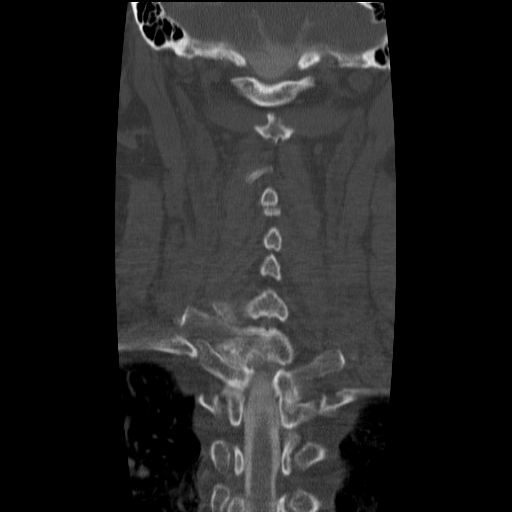

[Series 401: reformatted · sagittal · 0.43mm/px · 5 of 40 slices shown, 6 images (2 of 2)]
[im 14/40  bone]
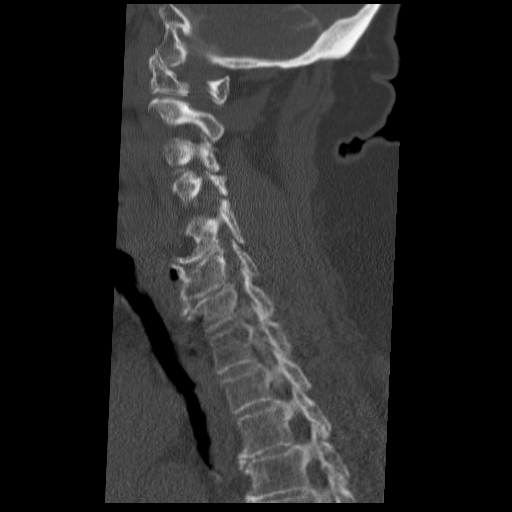
[im 17/40  bone]
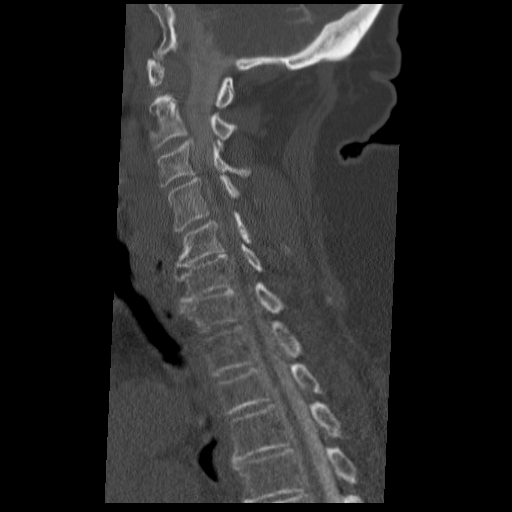
[im 20/40  soft-tissue]
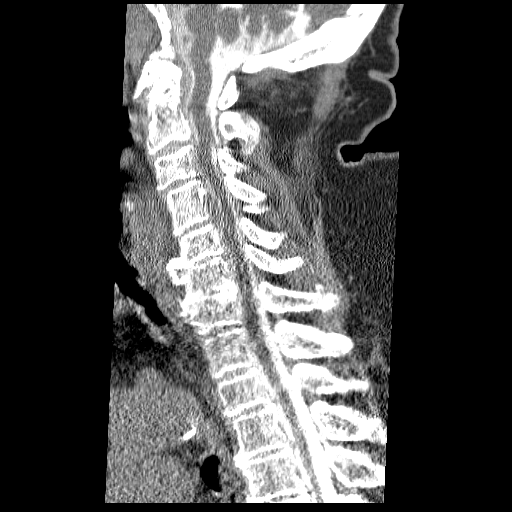
[im 20/40  bone]
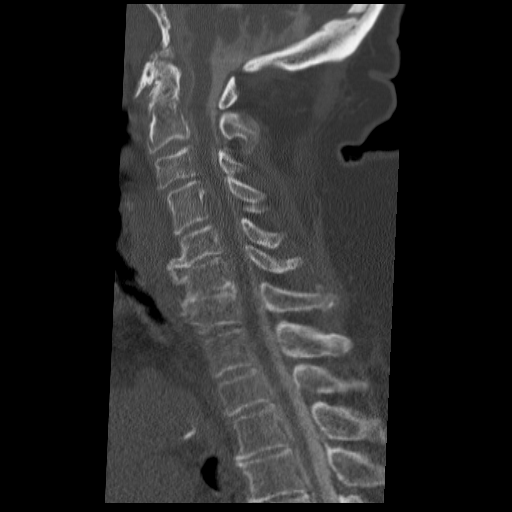
[im 23/40  bone]
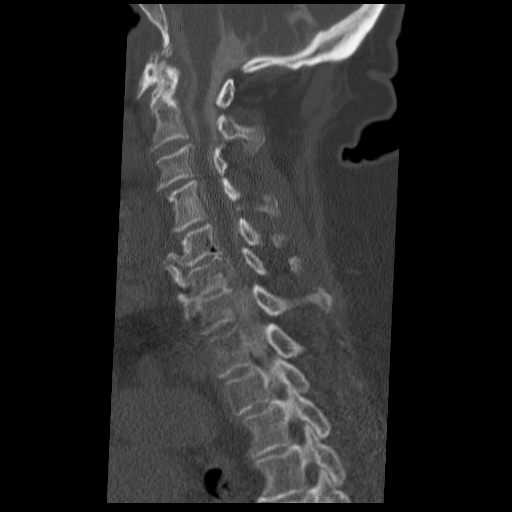
[im 27/40  bone]
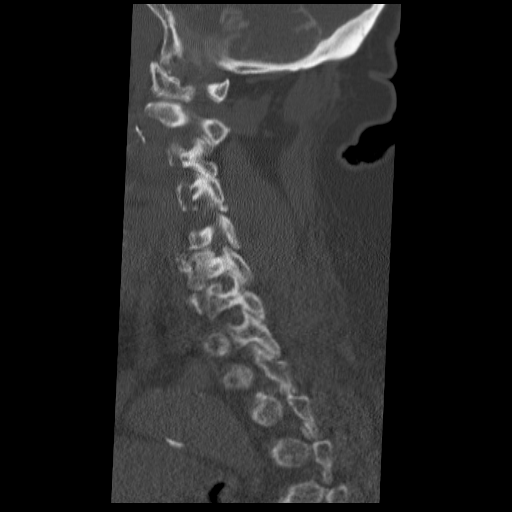

[15 of 33 positions shown; findings below may reference images not displayed]

IMPRESSION: Extradural defect C4-5 ventrally. 
POST MYELOGRAM CT SCAN OF THE CERVICAL SPINE:
FINDINGS: C1-2:  Advanced degenerative change with osteophytic spurring off the anterior arch of C1.  
C2-3:  Advanced facet hypertrophy.
C3-4:  Asymmetric facet arthropathy, right. 
C4-5:  Large central disk extrusion with free fragment upward. Severe cord compression.  Canal diameter measures 5 ? 6 mm.  Interestingly, there does not appear to be significant C5 nerve root encroachment.
C5-6:  Disk space narrowing with central osteophyte formation and bilateral uncinate spurring.  Mild canal stenosis.  Bilateral C6 nerve root encroachment is present.  Canal diameter 8 mm.
C6-7:  Advanced disk space narrowing with central osteophyte formation.  Bilateral uncinate spurring, but no definite C7 nerve root encroachment.  Mild facet disease.
C7-T1:  Normal interspace.
IMPRESSION: 1.  Large central disk extrusion C4-5 with significant cord compression; canal diameter 6 mm or less. 
2.  Moderate stenosis C5-6 due to spondylosis.
3.  Multilevel facet arthropathy.
4.  This report was given to Dr. Malonzy? Triage Nurse, because he was unavailable, with instructions to communicate this report to him as soon as possible.

## 2006-11-07 IMAGING — RF DG MYELOGRAM CERVICAL
12 series · 12 of 12 positions shown · IV contrast (omnipaque)
Comparison: none

CLINICAL DATA: Severe neck pain. 
CERVICAL MYELOGRAM: 
The patient?s myelogram was technically difficult because she was above ideal body weight and she was short of breath requiring an inhaler before we started.  The patient was placed prone on the table which was difficult for her.   Following informed consent, sterile preparation of the back, and adequate local anesthesia, a lumbar puncture was performed using a 22 gauge spinal needle at L5-S1 from a left paramedian approach.  Fluid was clear and colorless.  9 cc of Omnipaque 300 were instilled in the subarachnoid space.  The patient was tilted head down 20? for 60 seconds.  Unfortunately, the patient became short of breath during this maneuver.  Limited cervical films obtained show an extradural defect ventrally at C4-5.  There is no definite C5 nerve root encroachment, however.
TECHNIQUE: Multidetector CT imaging of the cervical spine was performed after intrathecal injection of contrast.  Multiplanar CT image reconstructions were also generated.

[Series 1: myelogram  white · 1 of 1 slices shown (1 of 12)]
[im 1/1]
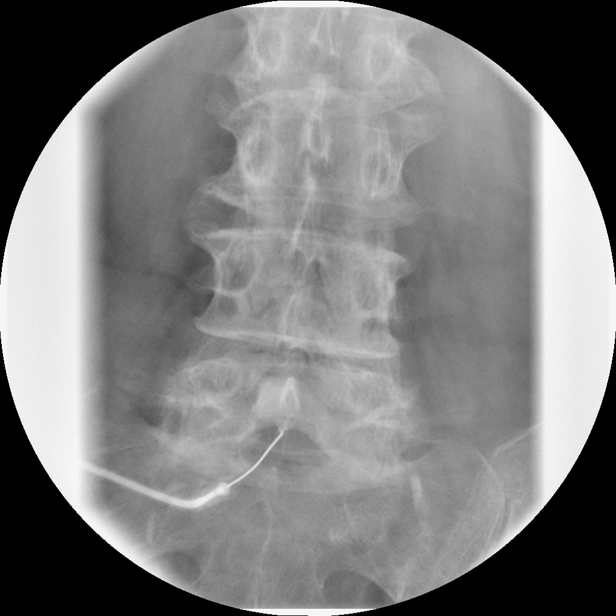

[Series 2: myelogram  white · 1 of 1 slices shown (2 of 12)]
[im 1/1]
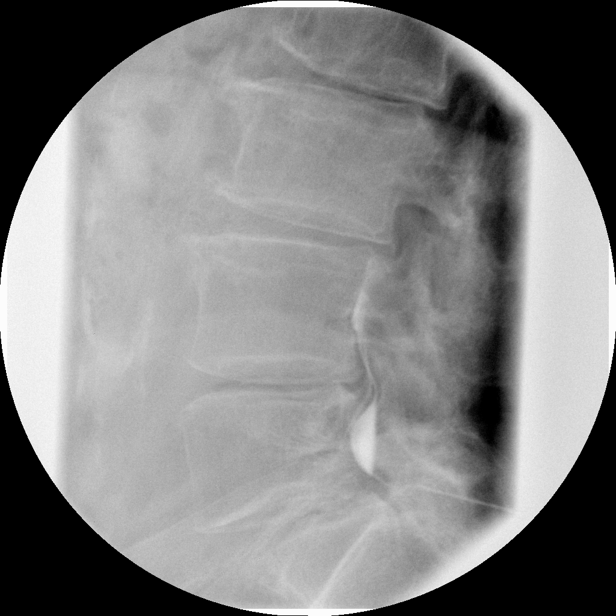

[Series 3: myelogram  white · 1 of 1 slices shown (3 of 12)]
[im 1/1]
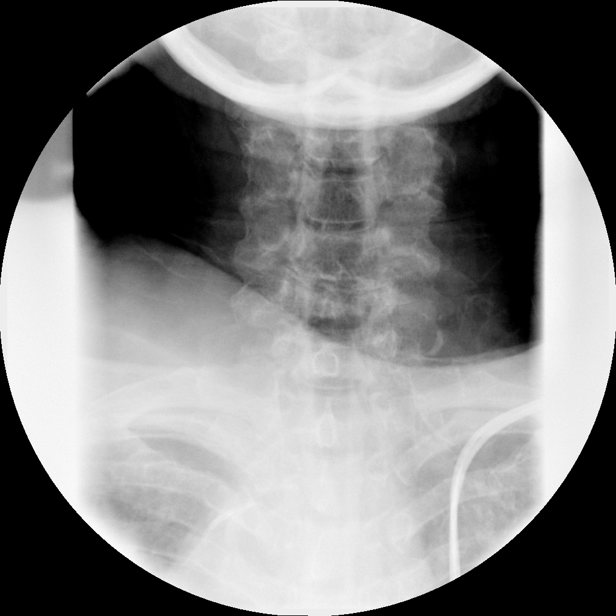

[Series 4: myelogram  white · 1 of 1 slices shown (4 of 12)]
[im 1/1]
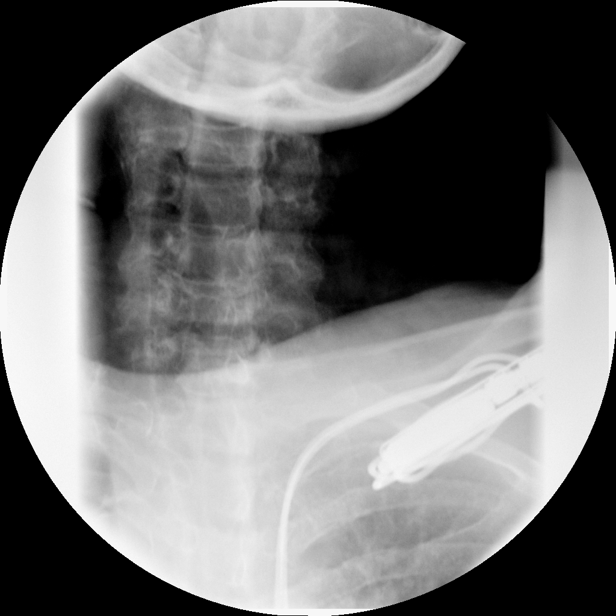

[Series 5: myelogram  white · 1 of 1 slices shown (5 of 12)]
[im 1/1]
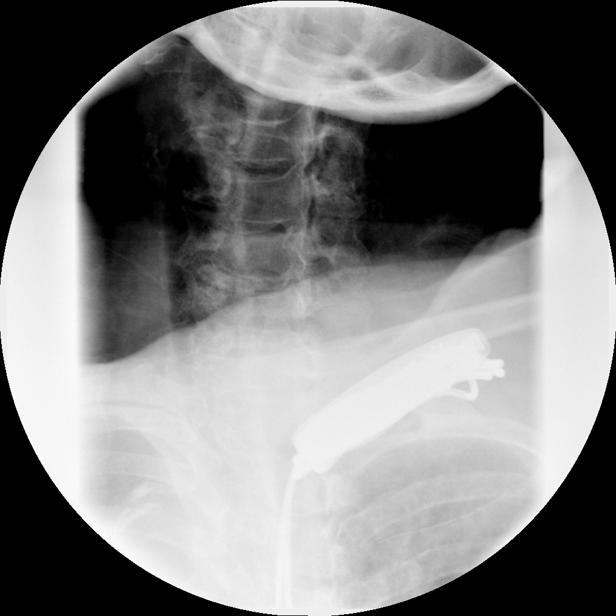

[Series 6: myelogram  white · 1 of 1 slices shown (6 of 12)]
[im 1/1]
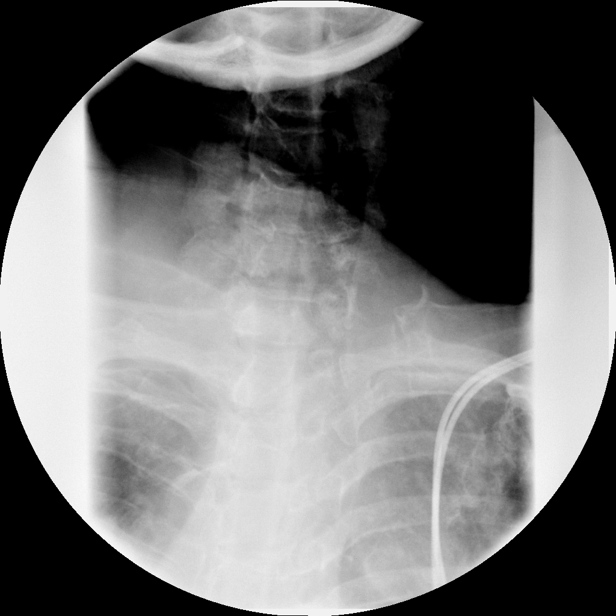

[Series 7: myelogram  white · 1 of 1 slices shown (7 of 12)]
[im 1/1]
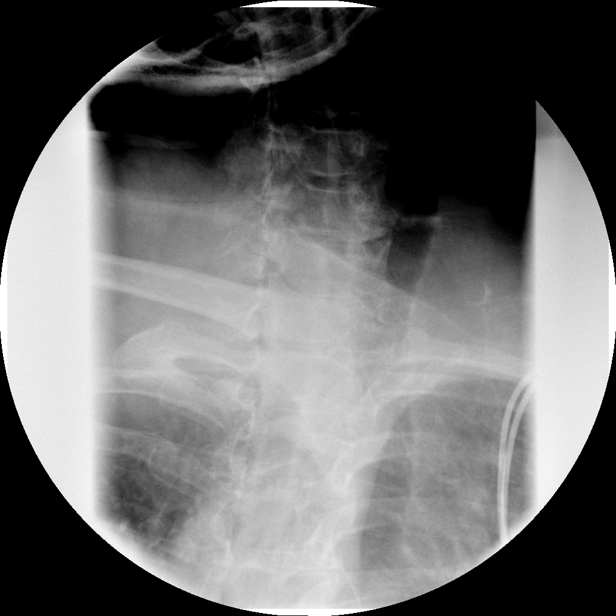

[Series 8: myelogram  white · 1 of 1 slices shown (8 of 12)]
[im 1/1]
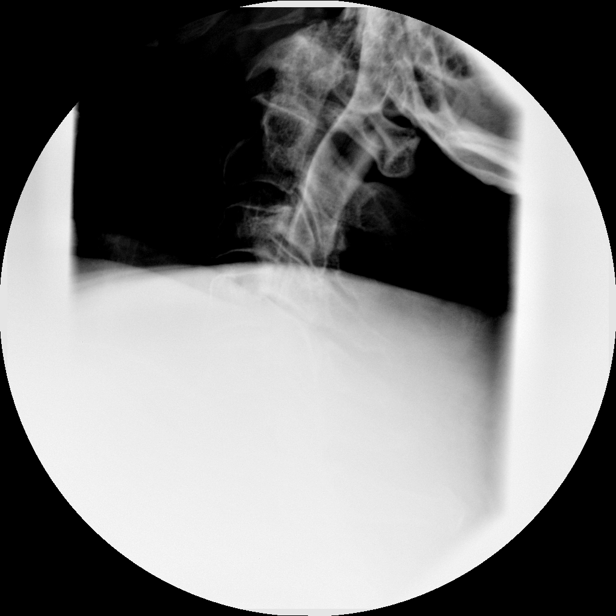

[Series 9: myelogram  white · 1 of 1 slices shown (9 of 12)]
[im 1/1]
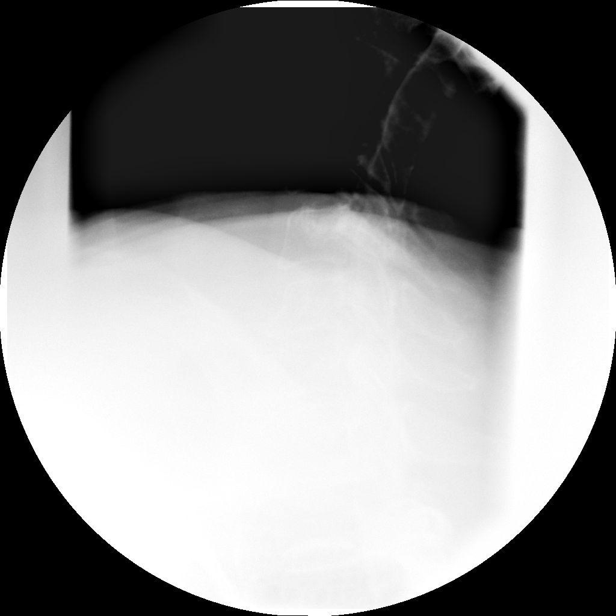

[Series 10: myelogram  white · 1 of 1 slices shown (10 of 12)]
[im 1/1]
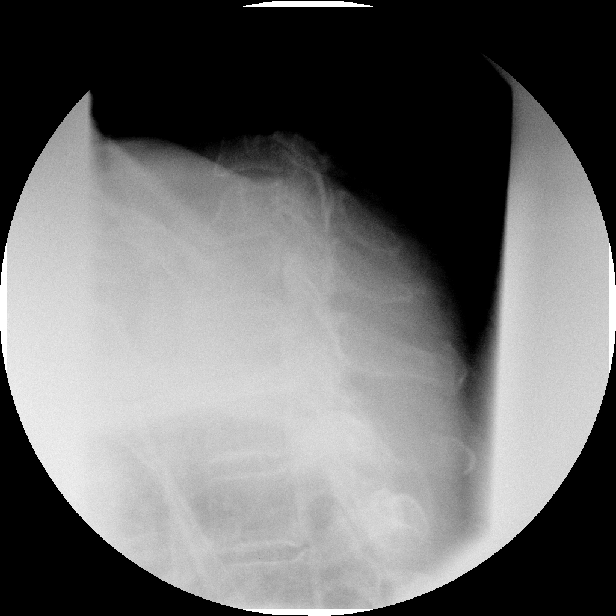

[Series 11: myelogram  white · 1 of 1 slices shown (11 of 12)]
[im 1/1]
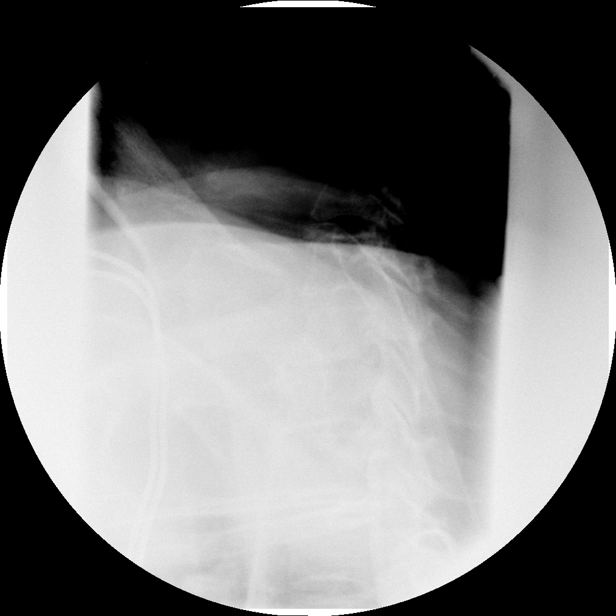

[Series 12: myelogram  white · 1 of 1 slices shown (12 of 12)]
[im 1/1]
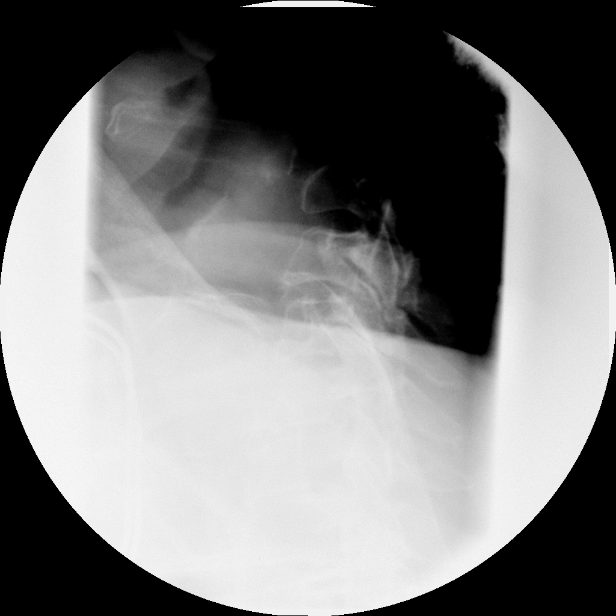

[12 of 12 positions shown; findings below may reference images not displayed]

IMPRESSION: Extradural defect C4-5 ventrally. 
POST MYELOGRAM CT SCAN OF THE CERVICAL SPINE:
FINDINGS: C1-2:  Advanced degenerative change with osteophytic spurring off the anterior arch of C1.  
C2-3:  Advanced facet hypertrophy.
C3-4:  Asymmetric facet arthropathy, right. 
C4-5:  Large central disk extrusion with free fragment upward. Severe cord compression.  Canal diameter measures 5 ? 6 mm.  Interestingly, there does not appear to be significant C5 nerve root encroachment.
C5-6:  Disk space narrowing with central osteophyte formation and bilateral uncinate spurring.  Mild canal stenosis.  Bilateral C6 nerve root encroachment is present.  Canal diameter 8 mm.
C6-7:  Advanced disk space narrowing with central osteophyte formation.  Bilateral uncinate spurring, but no definite C7 nerve root encroachment.  Mild facet disease.
C7-T1:  Normal interspace.
IMPRESSION: 1.  Large central disk extrusion C4-5 with significant cord compression; canal diameter 6 mm or less. 
2.  Moderate stenosis C5-6 due to spondylosis.
3.  Multilevel facet arthropathy.
4.  This report was given to Dr. Malonzy? Triage Nurse, because he was unavailable, with instructions to communicate this report to him as soon as possible.

## 2006-11-13 IMAGING — CR DG CHEST 2V
2 series · 2 of 2 positions shown · non-contrast
Comparison: 07/14/04.

CLINICAL DATA: Preop.  Asthma.  Shortness of breath.  Hypertension. 
 CHEST ? 2 VIEW:

[view not recorded (1 of 2)]
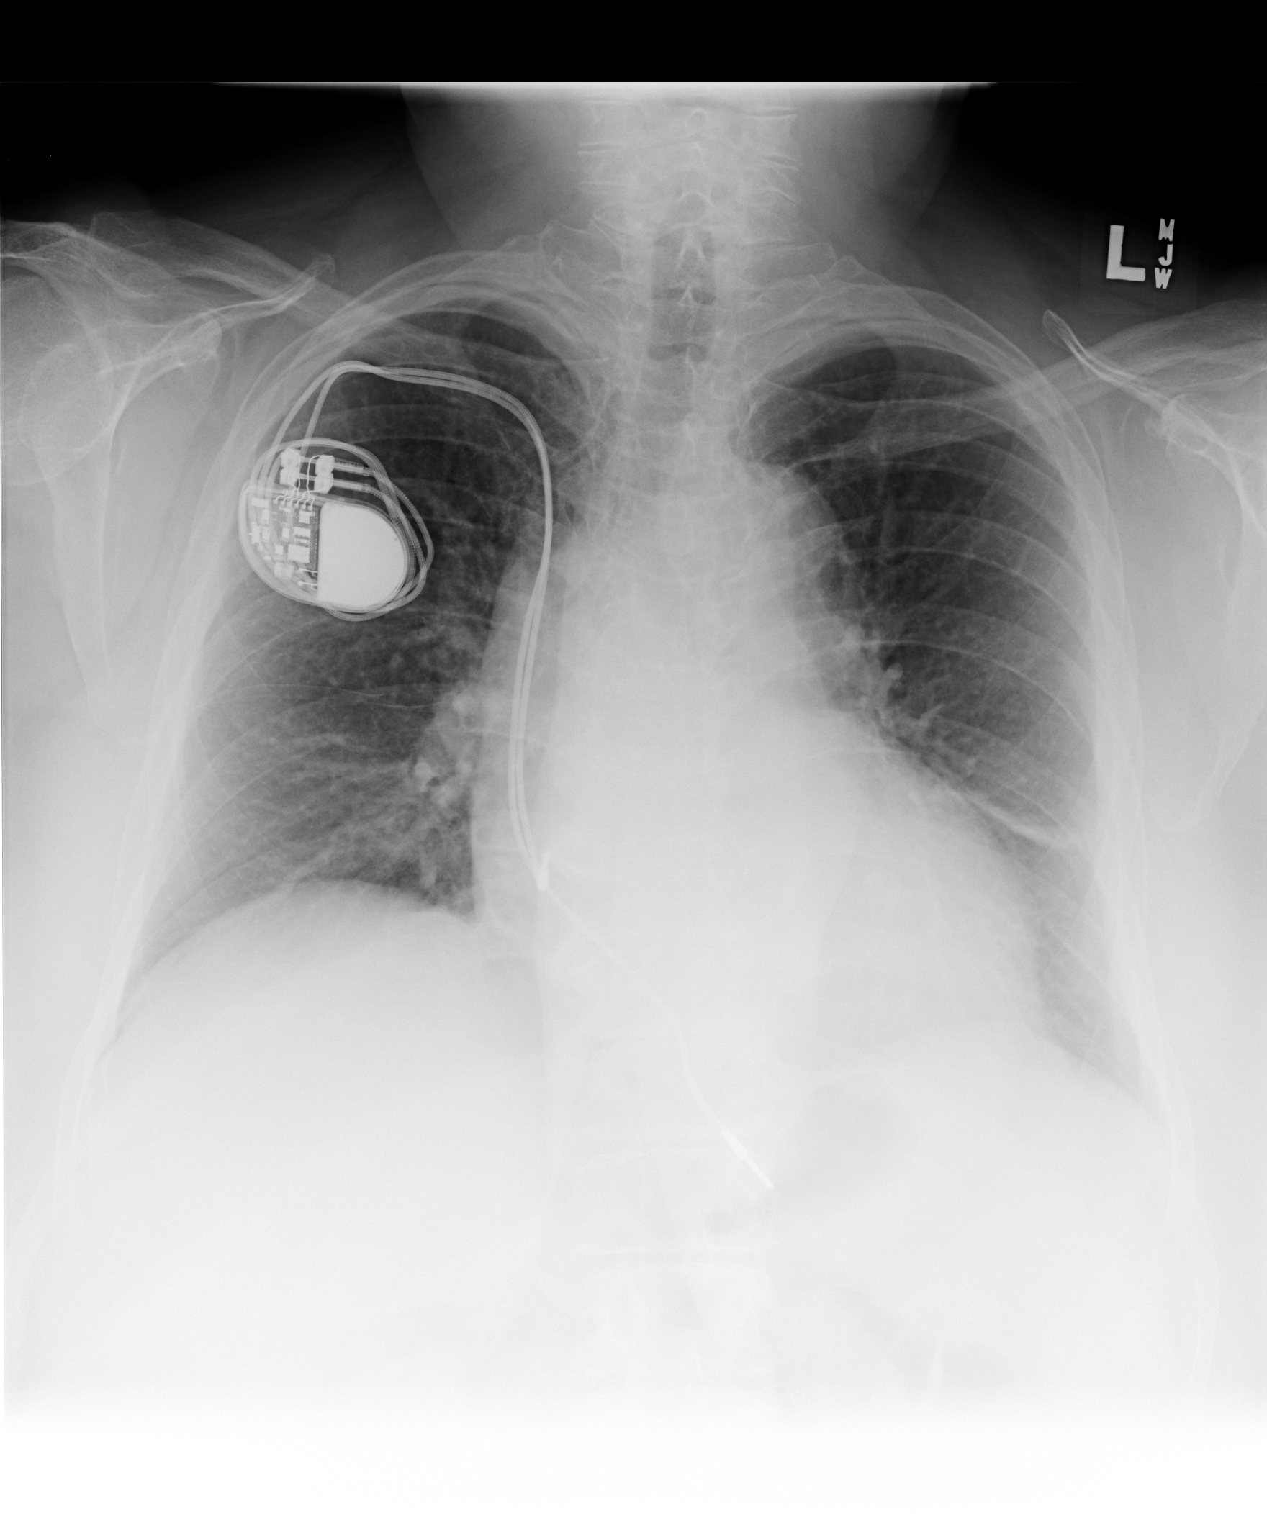

[view not recorded (2 of 2)]
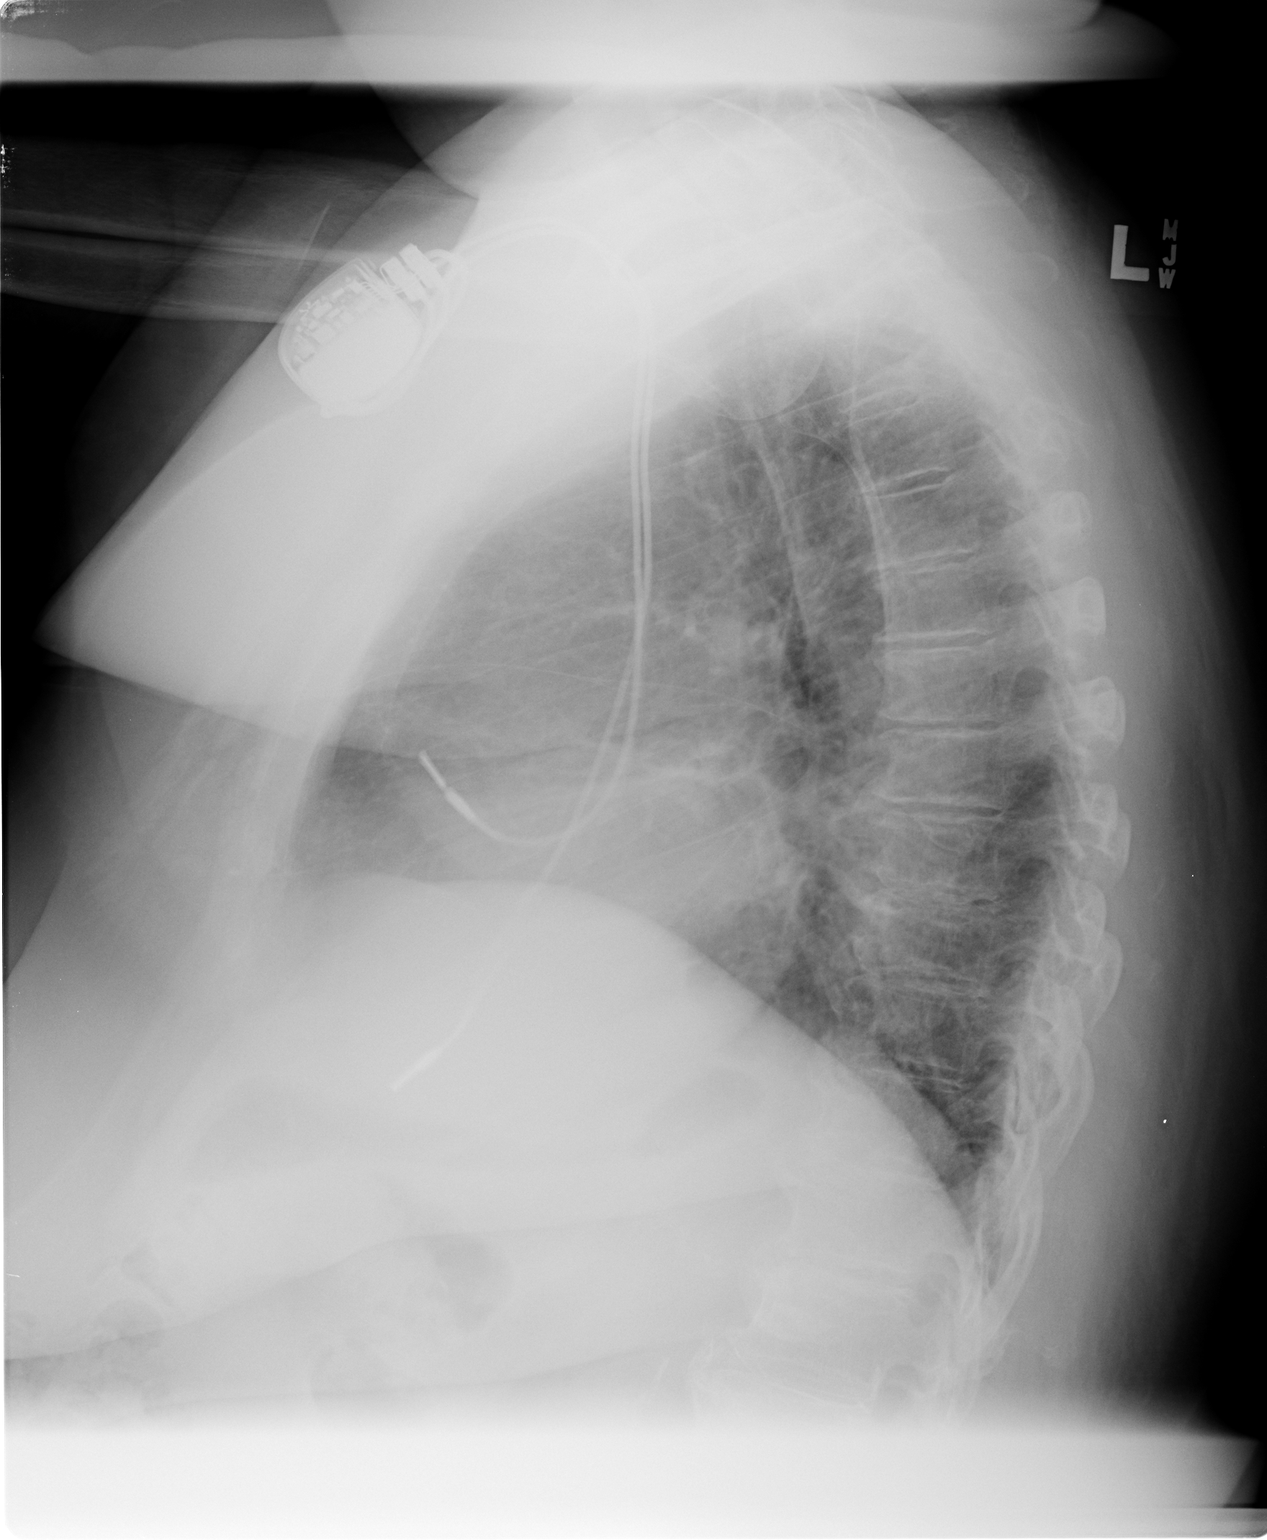

[2 of 2 positions shown; findings below may reference images not displayed]

FINDINGS: Trachea is midline.  Heart size is mildly enlarged.  Thoracic aorta is tortuous.  There are scattered linear opacities in the mid and lower lung zones.  Lungs are otherwise clear but somewhat low in volume.  No pleural fluid.  Degenerative changes are seen in the spine.
IMPRESSION: Scattered subsegmental atelectasis and cardiomegaly.

## 2006-11-13 IMAGING — RF DG CERVICAL SPINE 2 OR 3 VIEWS
1 series · 3 of 3 positions shown · non-contrast
Comparison: none

CLINICAL DATA: C4-5 herniated cervical disk.  
 CERVICAL SPINE - 2 VIEW:
 AP and lateral C-arm images of the cervical spine taken with the C-arm 11/17/2005.  
 The patient has undergone an anterior cervical fusion at C4-5.  Plate and screws and plug appear in good position with anatomic alignment and position with the visualized region.  The lateral view is slightly rotated.

[Series 1: run · 3 of 3 slices shown]
[im 1/3]
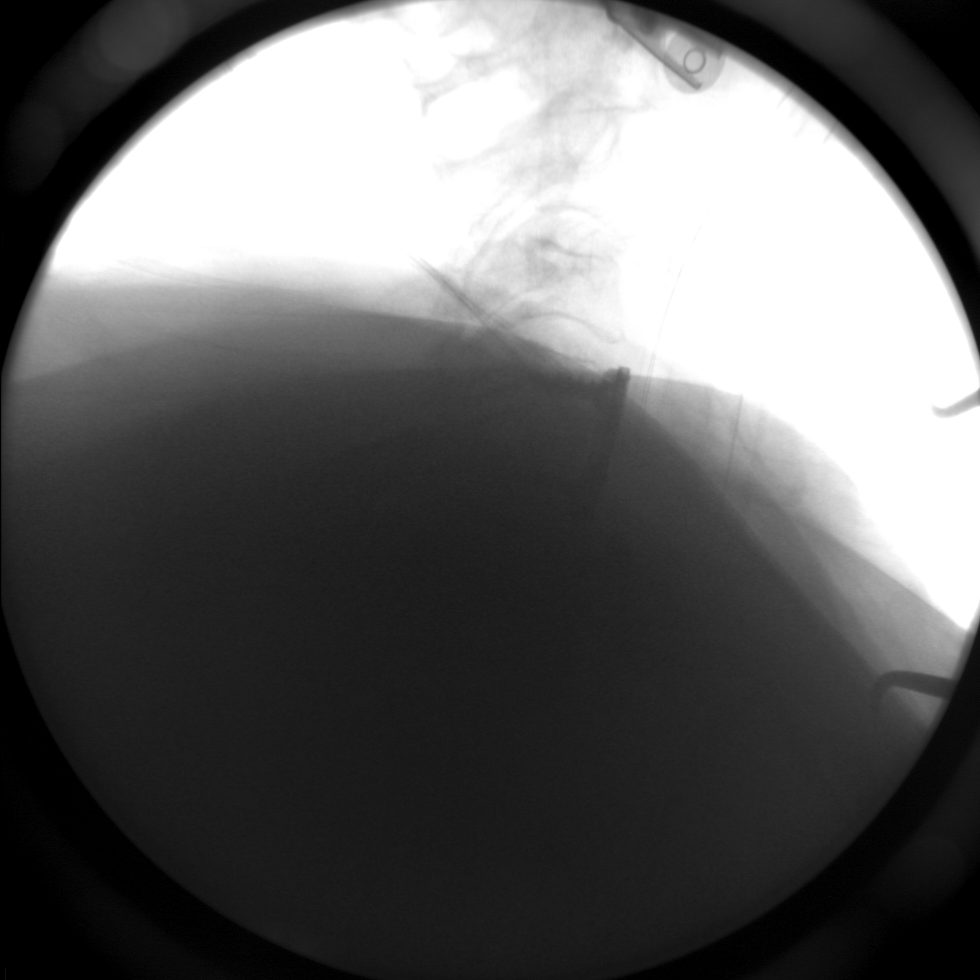
[im 2/3]
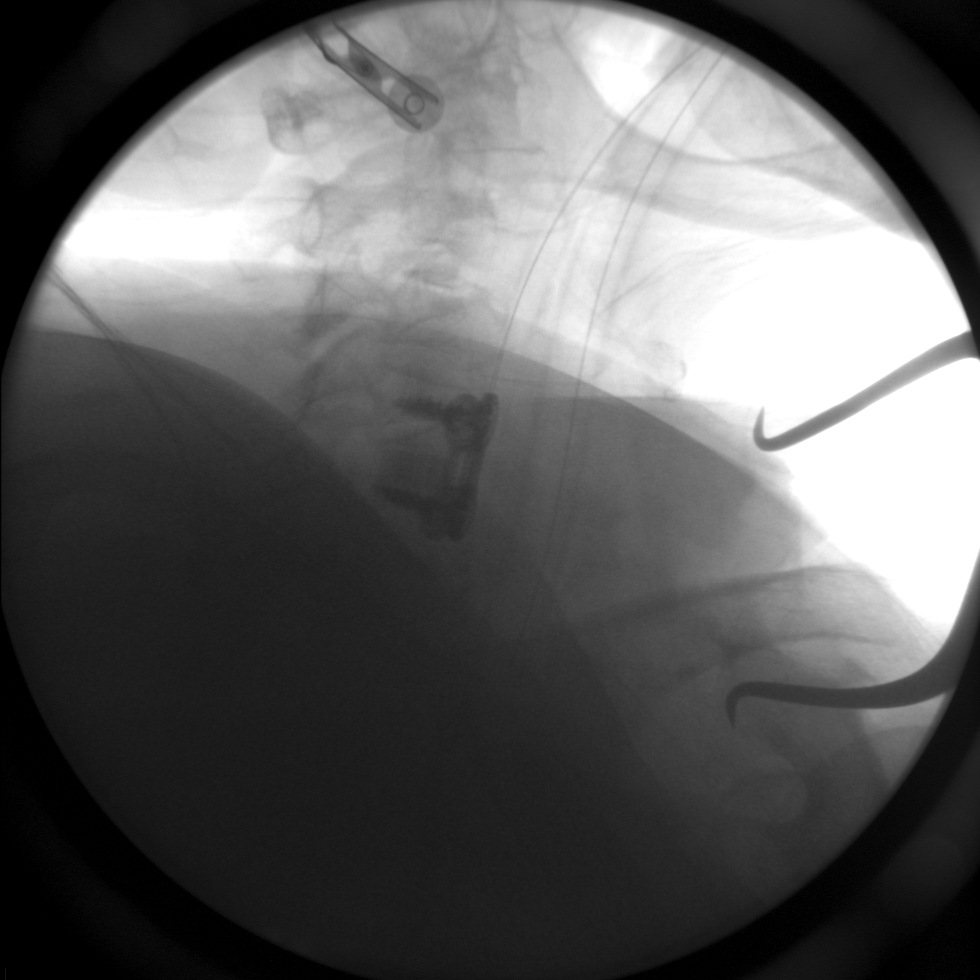
[im 3/3]
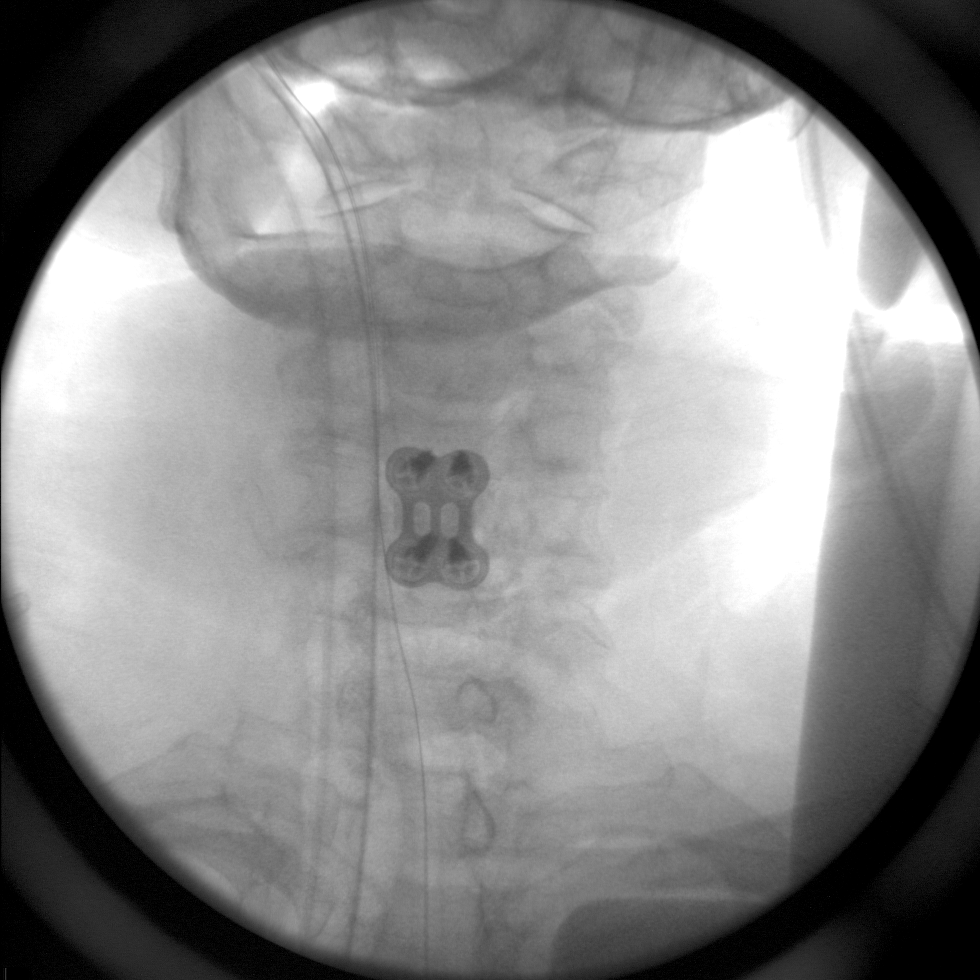

[3 of 3 positions shown; findings below may reference images not displayed]

IMPRESSION: Anterior cervical fusion performed at C4-5.

## 2006-11-19 ENCOUNTER — Ambulatory Visit: Payer: Self-pay

## 2006-11-20 ENCOUNTER — Emergency Department (HOSPITAL_COMMUNITY): Admission: EM | Admit: 2006-11-20 | Discharge: 2006-11-21 | Payer: Self-pay | Admitting: Emergency Medicine

## 2006-12-08 ENCOUNTER — Encounter (INDEPENDENT_AMBULATORY_CARE_PROVIDER_SITE_OTHER): Payer: Self-pay | Admitting: *Deleted

## 2006-12-08 ENCOUNTER — Encounter: Admission: RE | Admit: 2006-12-08 | Discharge: 2006-12-08 | Payer: Self-pay | Admitting: Family Medicine

## 2006-12-11 ENCOUNTER — Ambulatory Visit: Payer: Self-pay | Admitting: Internal Medicine

## 2006-12-14 ENCOUNTER — Ambulatory Visit (HOSPITAL_COMMUNITY): Admission: RE | Admit: 2006-12-14 | Discharge: 2006-12-14 | Payer: Self-pay | Admitting: Internal Medicine

## 2006-12-14 ENCOUNTER — Encounter: Payer: Self-pay | Admitting: Internal Medicine

## 2006-12-14 DIAGNOSIS — K2970 Gastritis, unspecified, without bleeding: Secondary | ICD-10-CM | POA: Insufficient documentation

## 2006-12-24 ENCOUNTER — Ambulatory Visit: Payer: Self-pay | Admitting: Internal Medicine

## 2007-02-03 IMAGING — CR DG CHEST 1V PORT
1 series · 1 of 1 positions shown · non-contrast
Comparison: 11/17/05.

CLINICAL DATA: Chest pain.  Arrhythmia.  
 PORTABLE CHEST - 1 VIEW 02/07/06:

[view not recorded]
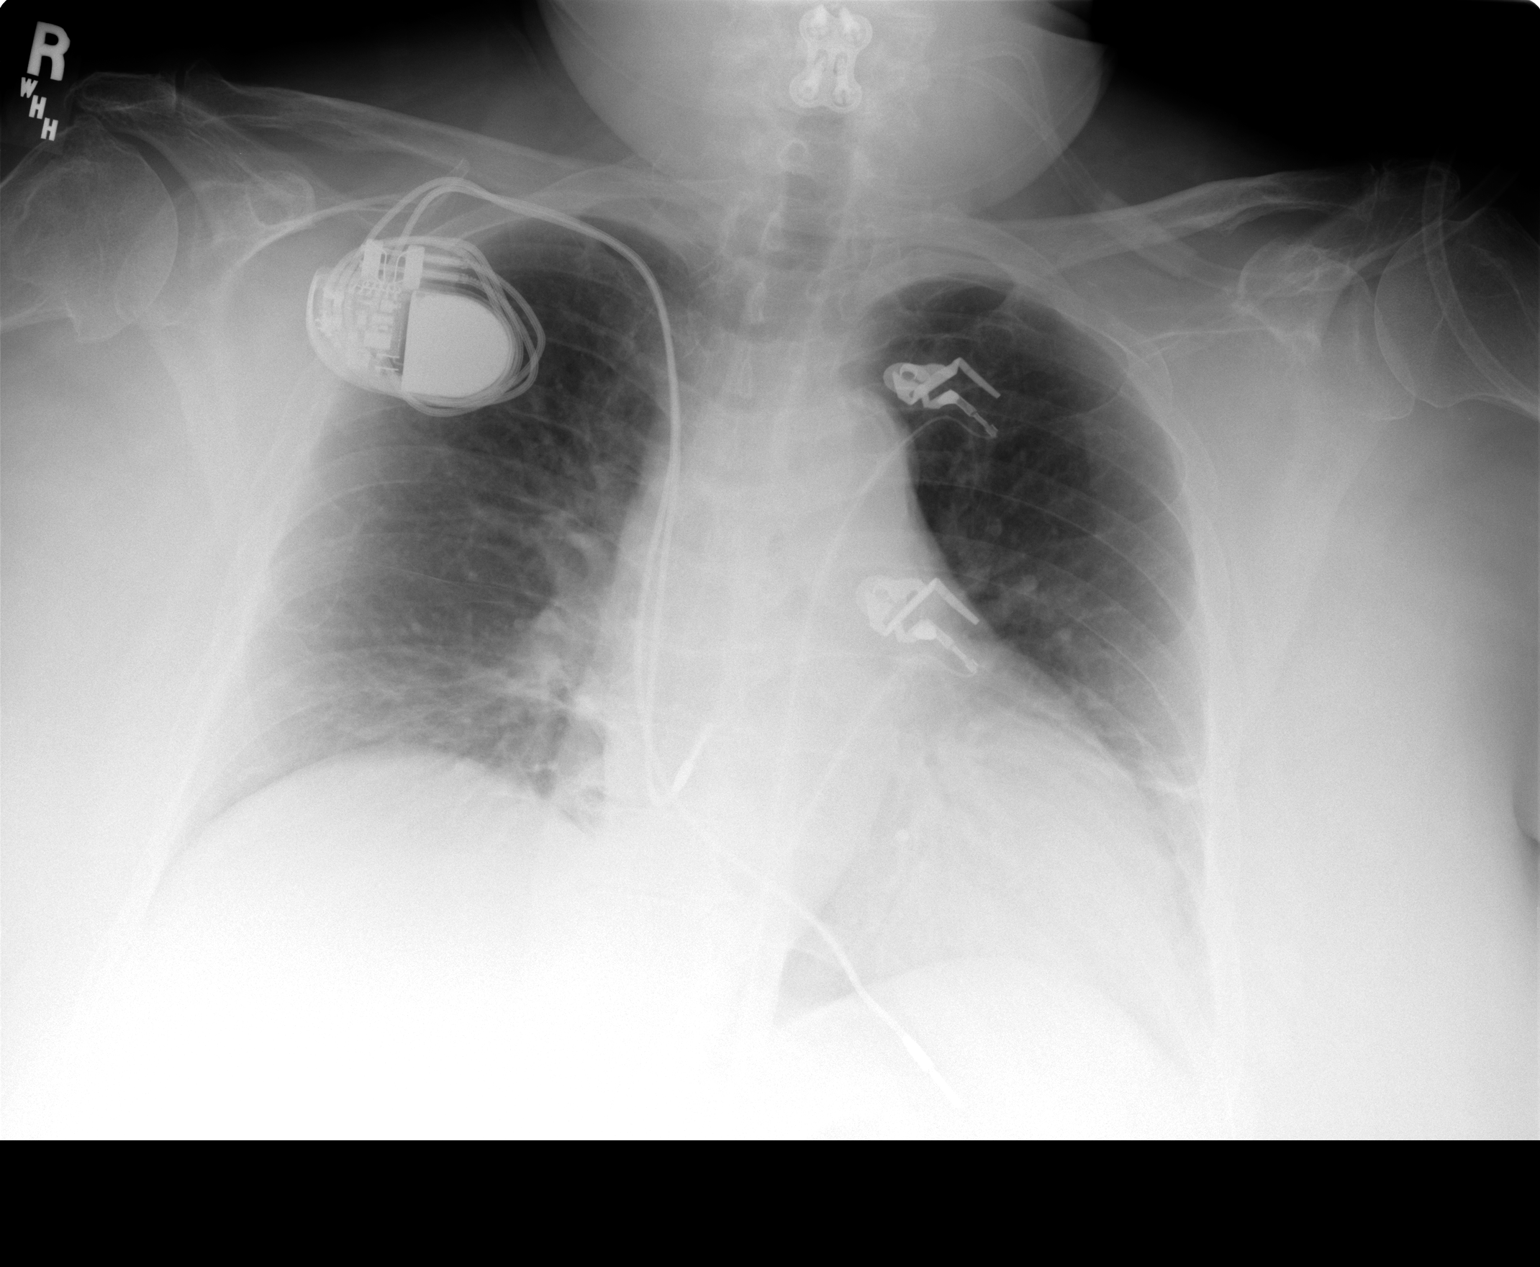

[1 of 1 positions shown; findings below may reference images not displayed]

FINDINGS: Right-sided pacemaker noted with its leads over the right atrium and ventricle, respectively.  Lung volumes are low with bibasilar atelectasis.  Heart size remains enlarged without overt edema or focal pulmonary opacity.
IMPRESSION: Low lung volumes with bibasilar subsegmental atelectasis.

## 2007-02-09 IMAGING — CR DG CHEST 2V
2 series · 2 of 2 positions shown · non-contrast
Comparison: 02/07/06.

CLINICAL DATA: Pacer at end of life.  
 CHEST - 2 VIEW:

[view not recorded (1 of 2)]
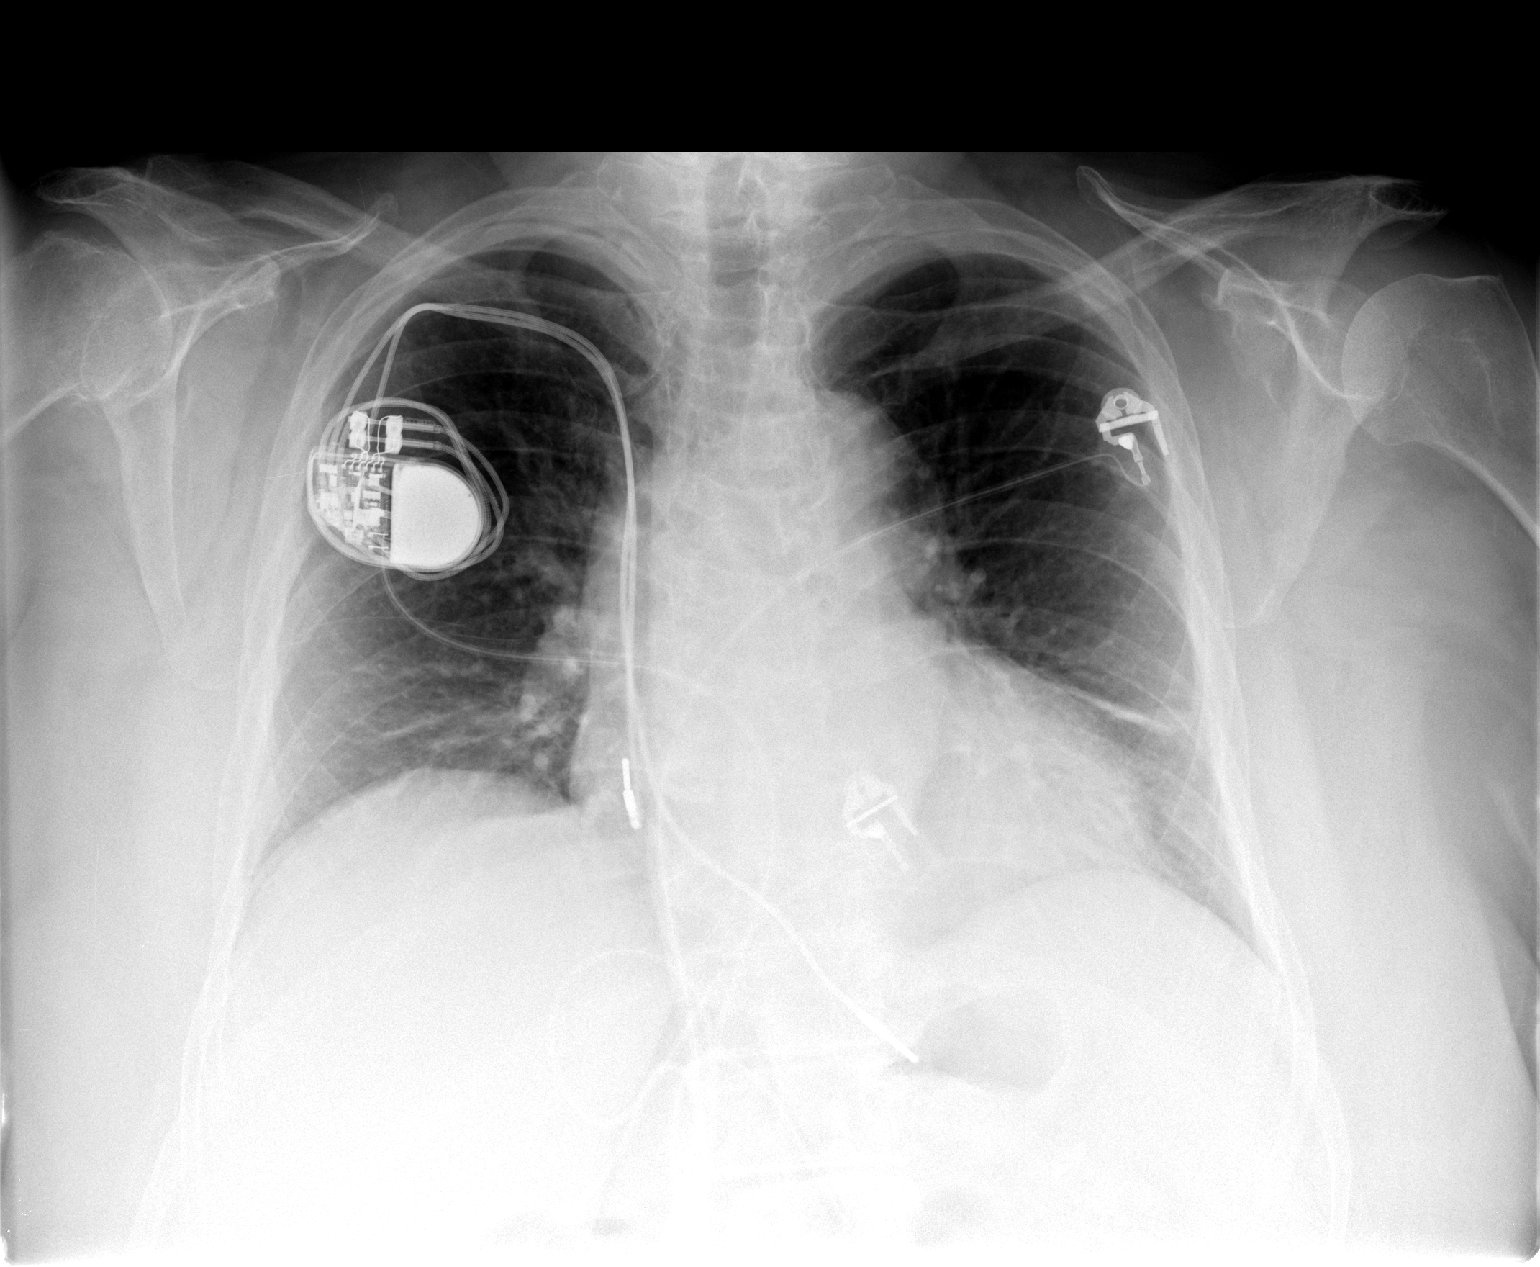

[view not recorded (2 of 2)]
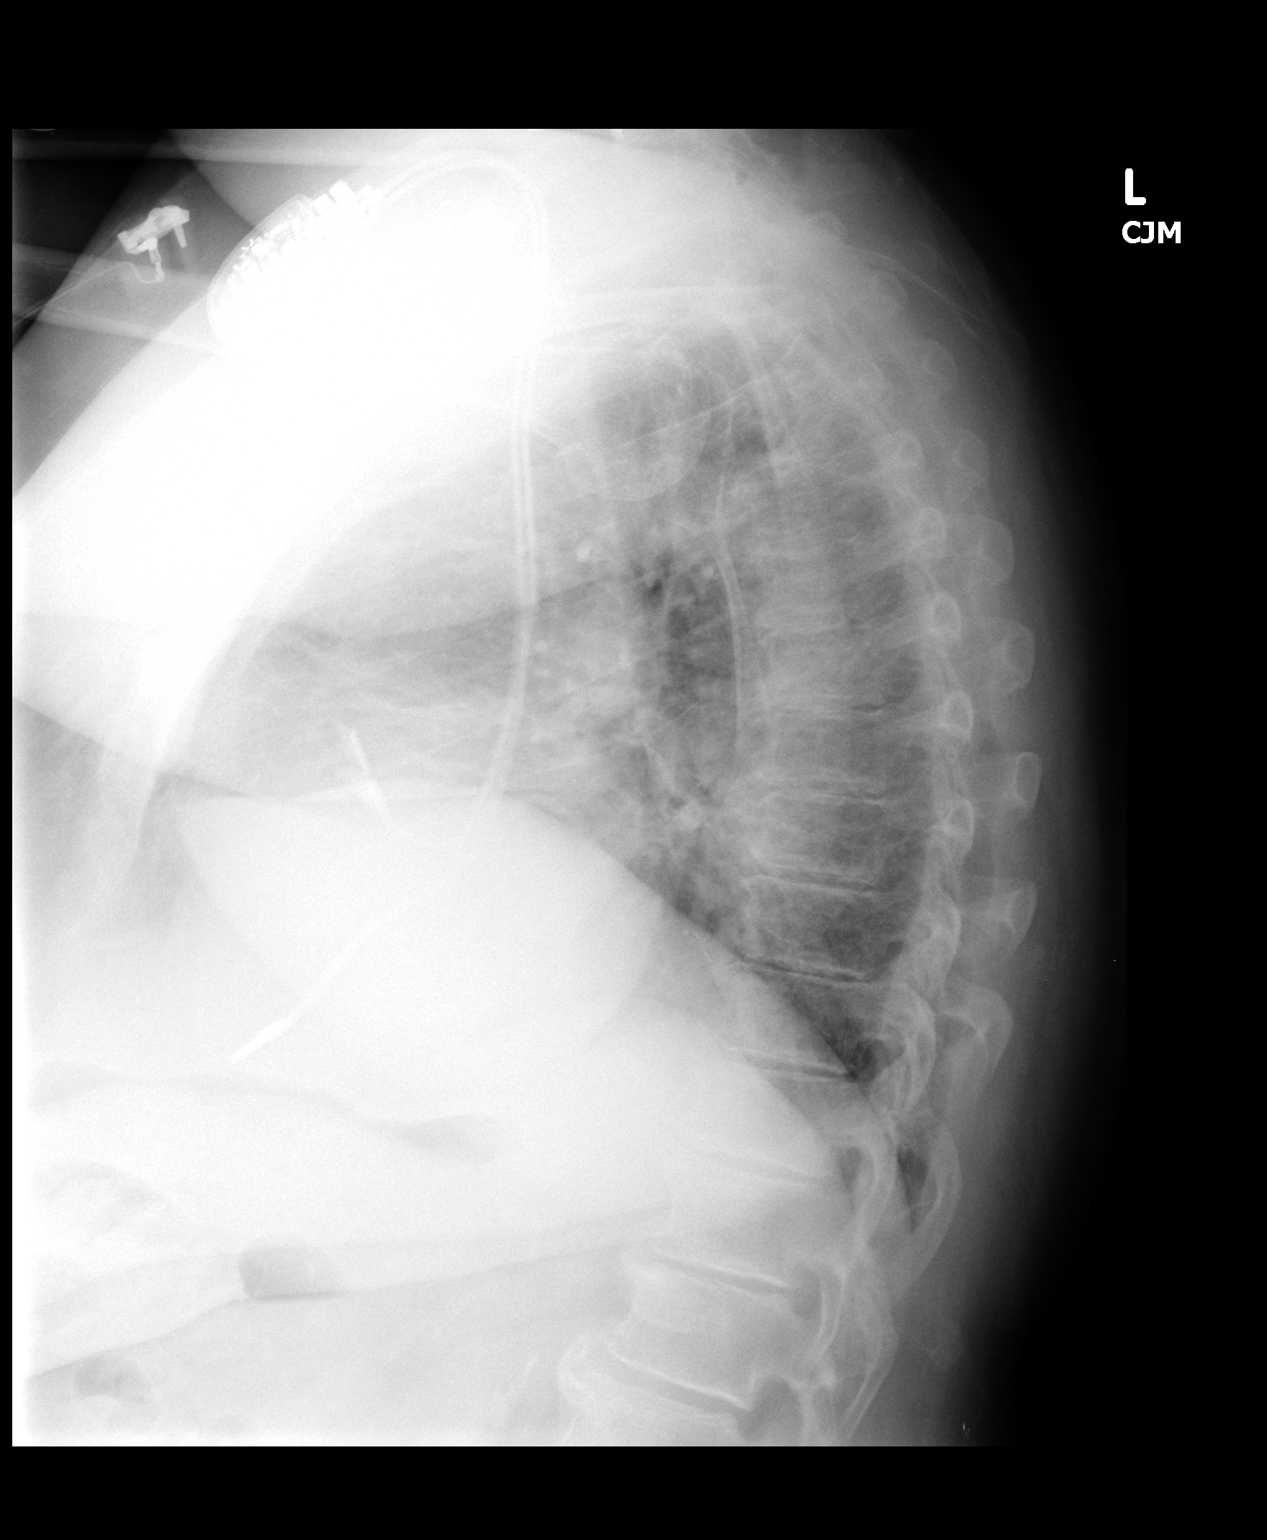

[2 of 2 positions shown; findings below may reference images not displayed]

FINDINGS: There is a right chest wall pacer with leads in the right atrial appendage and right ventricle.  The heart size is normal.  There are no effusions or edema.   Low lung volumes.   Scarring is seen at both lung bases.
IMPRESSION: 1.  Bibasilar scarring.  
 2.  Right chest wall pacer without evidence for heart failure.

## 2007-02-13 IMAGING — CR DG CHEST 2V
2 series · 2 of 2 positions shown · non-contrast
Comparison: 02/13/06.

CLINICAL DATA: Pacer insertion.  Chest soreness.  Some shortness of breath. 
 CHEST - 2 VIEW:

[w chest pa]
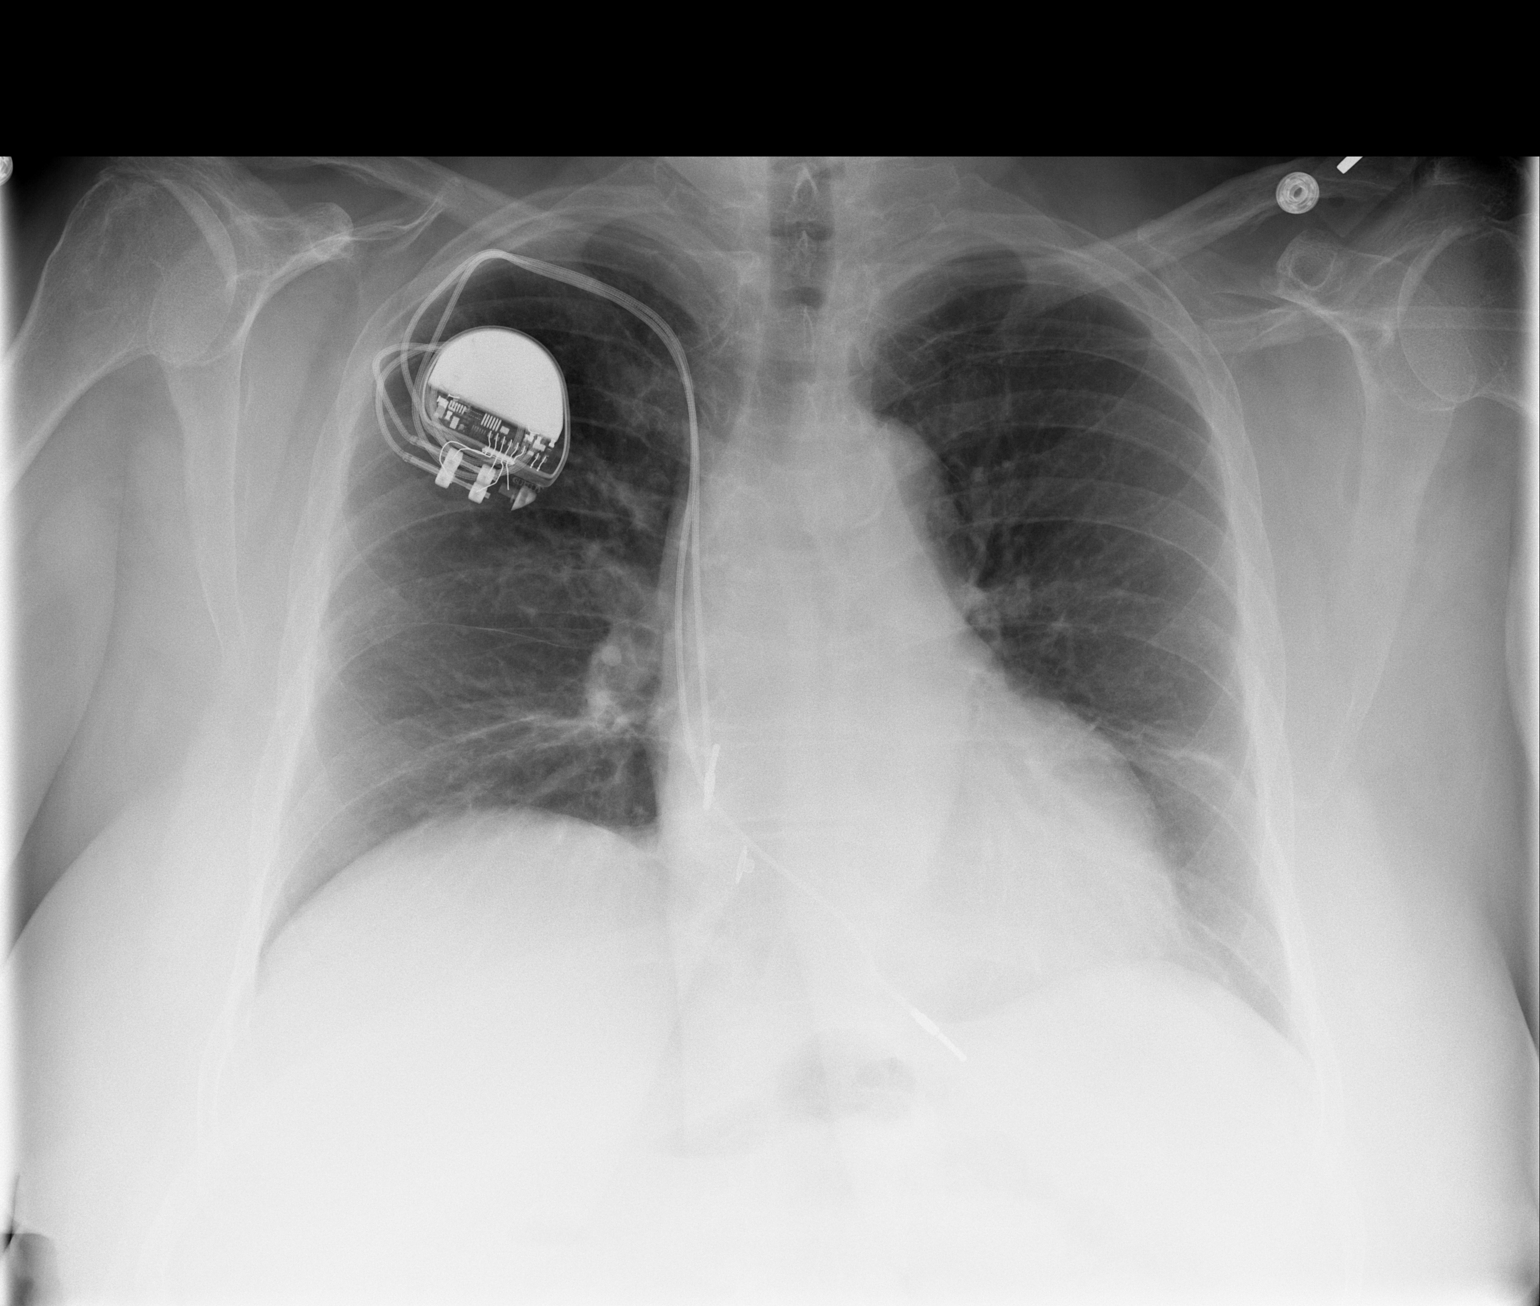

[w chest lat]
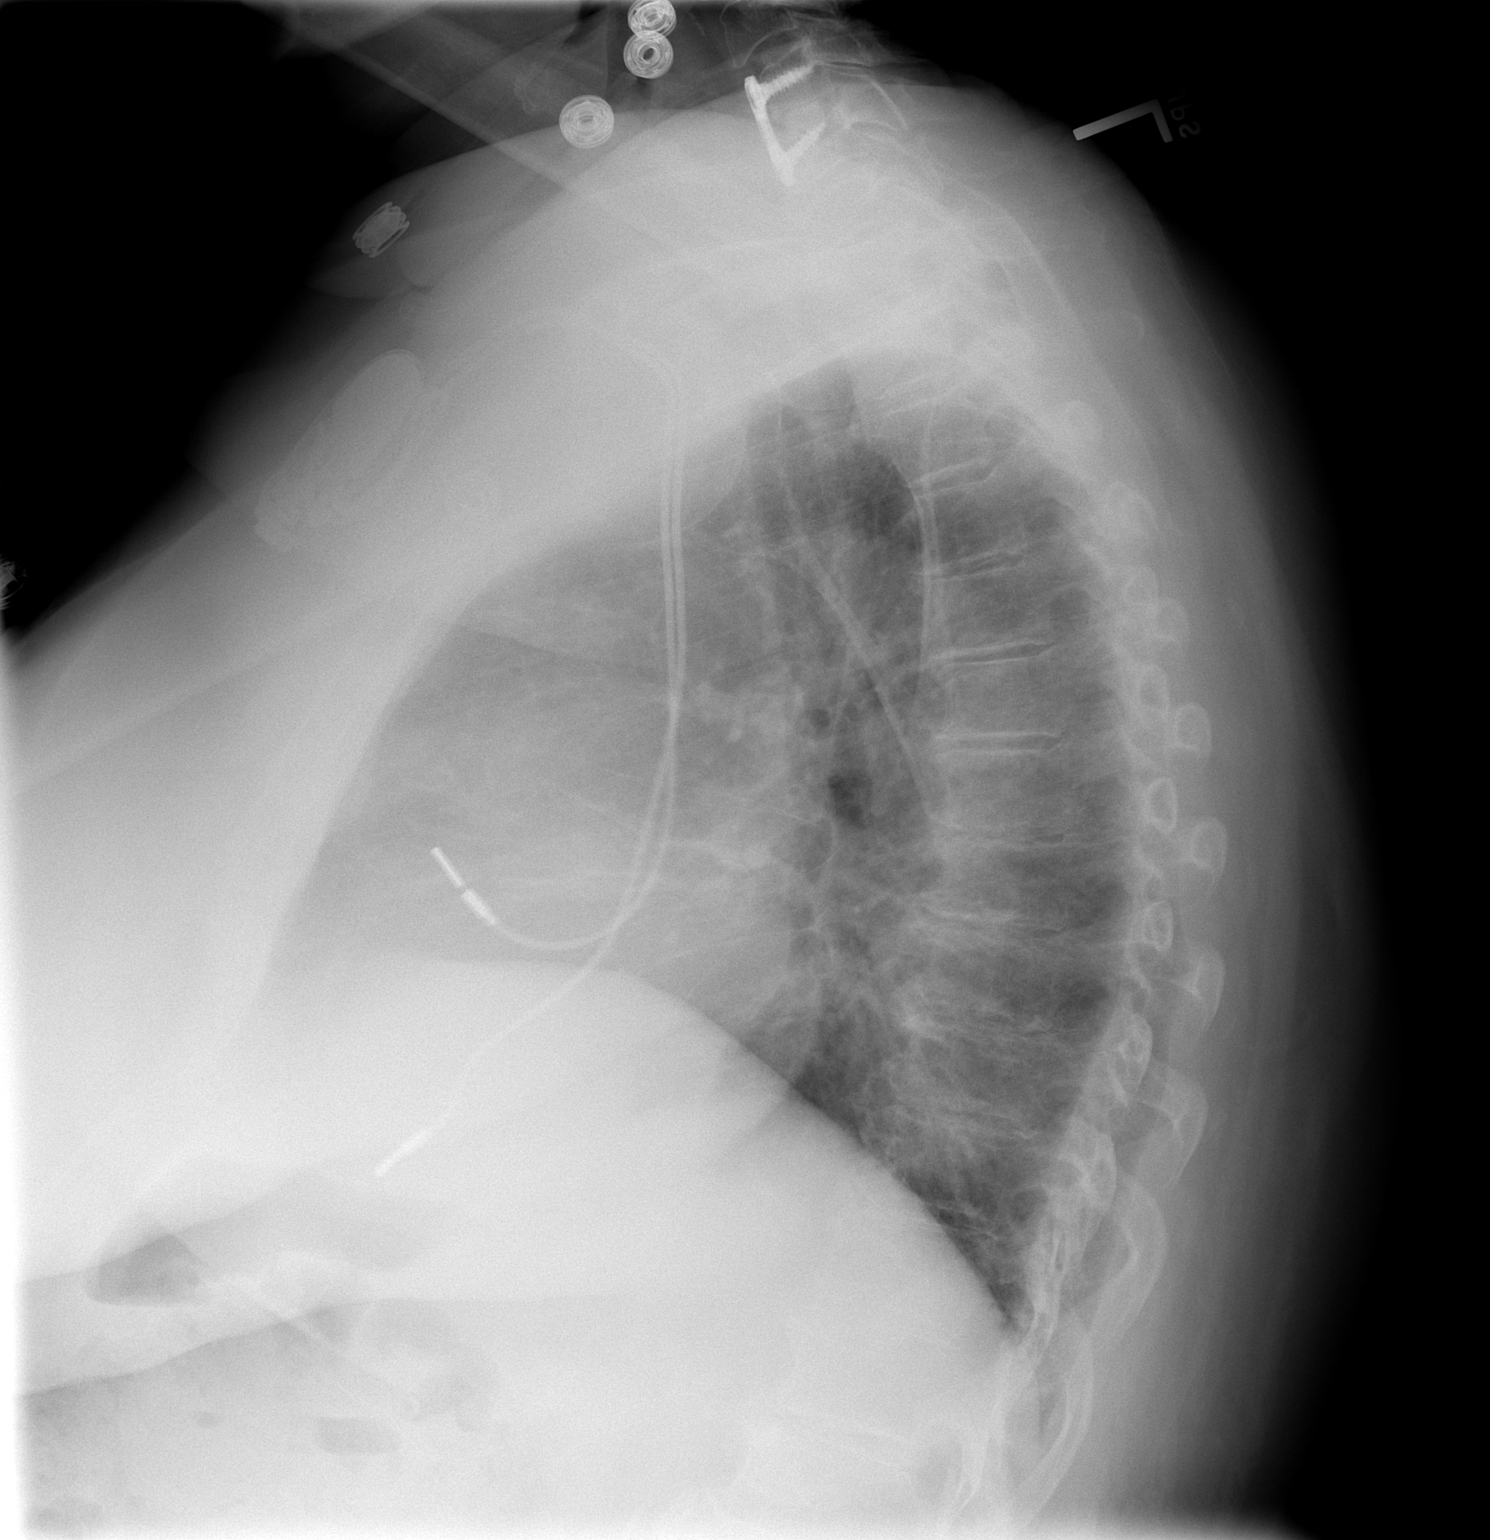

[2 of 2 positions shown; findings below may reference images not displayed]

FINDINGS: Two views of the chest show only mild basilar linear atelectasis which has improved.  No pneumothorax is seen.  Cardiomegaly remains and permanent pacer is unchanged.
IMPRESSION: Slight decrease in bibasilar linear atelectasis.

## 2007-02-16 ENCOUNTER — Ambulatory Visit: Payer: Self-pay | Admitting: Internal Medicine

## 2007-04-12 DIAGNOSIS — K648 Other hemorrhoids: Secondary | ICD-10-CM

## 2007-04-12 DIAGNOSIS — Z950 Presence of cardiac pacemaker: Secondary | ICD-10-CM | POA: Insufficient documentation

## 2007-04-12 DIAGNOSIS — J45909 Unspecified asthma, uncomplicated: Secondary | ICD-10-CM | POA: Insufficient documentation

## 2007-04-12 DIAGNOSIS — G4730 Sleep apnea, unspecified: Secondary | ICD-10-CM | POA: Insufficient documentation

## 2007-04-12 HISTORY — DX: Other hemorrhoids: K64.8

## 2007-04-16 DIAGNOSIS — D649 Anemia, unspecified: Secondary | ICD-10-CM | POA: Insufficient documentation

## 2007-04-16 DIAGNOSIS — K5730 Diverticulosis of large intestine without perforation or abscess without bleeding: Secondary | ICD-10-CM | POA: Insufficient documentation

## 2007-04-16 DIAGNOSIS — M129 Arthropathy, unspecified: Secondary | ICD-10-CM | POA: Insufficient documentation

## 2007-04-16 DIAGNOSIS — I10 Essential (primary) hypertension: Secondary | ICD-10-CM | POA: Insufficient documentation

## 2007-04-24 ENCOUNTER — Observation Stay (HOSPITAL_COMMUNITY): Admission: EM | Admit: 2007-04-24 | Discharge: 2007-04-27 | Payer: Self-pay | Admitting: Emergency Medicine

## 2007-04-26 HISTORY — PX: CARDIAC CATHETERIZATION: SHX172

## 2007-06-04 ENCOUNTER — Emergency Department (HOSPITAL_COMMUNITY): Admission: EM | Admit: 2007-06-04 | Discharge: 2007-06-04 | Payer: Self-pay | Admitting: Emergency Medicine

## 2007-07-01 ENCOUNTER — Encounter: Payer: Self-pay | Admitting: Internal Medicine

## 2007-07-02 ENCOUNTER — Ambulatory Visit (HOSPITAL_COMMUNITY): Admission: RE | Admit: 2007-07-02 | Discharge: 2007-07-02 | Payer: Self-pay | Admitting: Orthopedic Surgery

## 2007-08-03 ENCOUNTER — Ambulatory Visit: Payer: Self-pay | Admitting: Internal Medicine

## 2007-08-04 ENCOUNTER — Ambulatory Visit: Payer: Self-pay | Admitting: Internal Medicine

## 2007-08-04 LAB — CONVERTED CEMR LAB
BUN: 23 mg/dL (ref 6–23)
Basophils Absolute: 0.1 10*3/uL (ref 0.0–0.1)
Basophils Relative: 1.1 % — ABNORMAL HIGH (ref 0.0–1.0)
CO2: 26 meq/L (ref 19–32)
Calcium: 9.3 mg/dL (ref 8.4–10.5)
Chloride: 106 meq/L (ref 96–112)
Creatinine, Ser: 1 mg/dL (ref 0.4–1.2)
Eosinophils Absolute: 0.1 10*3/uL (ref 0.0–0.7)
Eosinophils Relative: 1.9 % (ref 0.0–5.0)
GFR calc Af Amer: 72 mL/min
GFR calc non Af Amer: 60 mL/min
Glucose, Bld: 106 mg/dL — ABNORMAL HIGH (ref 70–99)
HCT: 35.9 % — ABNORMAL LOW (ref 36.0–46.0)
Hemoglobin: 12.3 g/dL (ref 12.0–15.0)
INR: 1 (ref 0.8–1.0)
Lymphocytes Relative: 25 % (ref 12.0–46.0)
MCHC: 34.2 g/dL (ref 30.0–36.0)
MCV: 79.3 fL (ref 78.0–100.0)
Monocytes Absolute: 0.6 10*3/uL (ref 0.1–1.0)
Monocytes Relative: 7.9 % (ref 3.0–12.0)
Neutro Abs: 4.6 10*3/uL (ref 1.4–7.7)
Neutrophils Relative %: 64.1 % (ref 43.0–77.0)
Platelets: 199 10*3/uL (ref 150–400)
Potassium: 4.2 meq/L (ref 3.5–5.1)
Prothrombin Time: 11.8 s (ref 10.9–13.3)
RBC: 4.53 M/uL (ref 3.87–5.11)
RDW: 15.1 % — ABNORMAL HIGH (ref 11.5–14.6)
Sodium: 138 meq/L (ref 135–145)
WBC: 7.2 10*3/uL (ref 4.5–10.5)
aPTT: 27.7 s (ref 21.7–29.8)

## 2007-08-09 ENCOUNTER — Ambulatory Visit (HOSPITAL_COMMUNITY): Admission: RE | Admit: 2007-08-09 | Discharge: 2007-08-09 | Payer: Self-pay | Admitting: Internal Medicine

## 2007-08-09 ENCOUNTER — Ambulatory Visit: Payer: Self-pay | Admitting: Internal Medicine

## 2007-09-08 ENCOUNTER — Ambulatory Visit: Payer: Self-pay

## 2007-10-25 ENCOUNTER — Ambulatory Visit (HOSPITAL_COMMUNITY): Admission: RE | Admit: 2007-10-25 | Discharge: 2007-10-25 | Payer: Self-pay | Admitting: Orthopedic Surgery

## 2007-10-25 IMAGING — CR DG CHEST 2V
2 series · 2 of 2 positions shown · non-contrast
Comparison: 02/17/2006

CLINICAL DATA: 61-year-old with chest pain and wheezing.  
 CHEST - 2 VIEW:

[w chest pa]
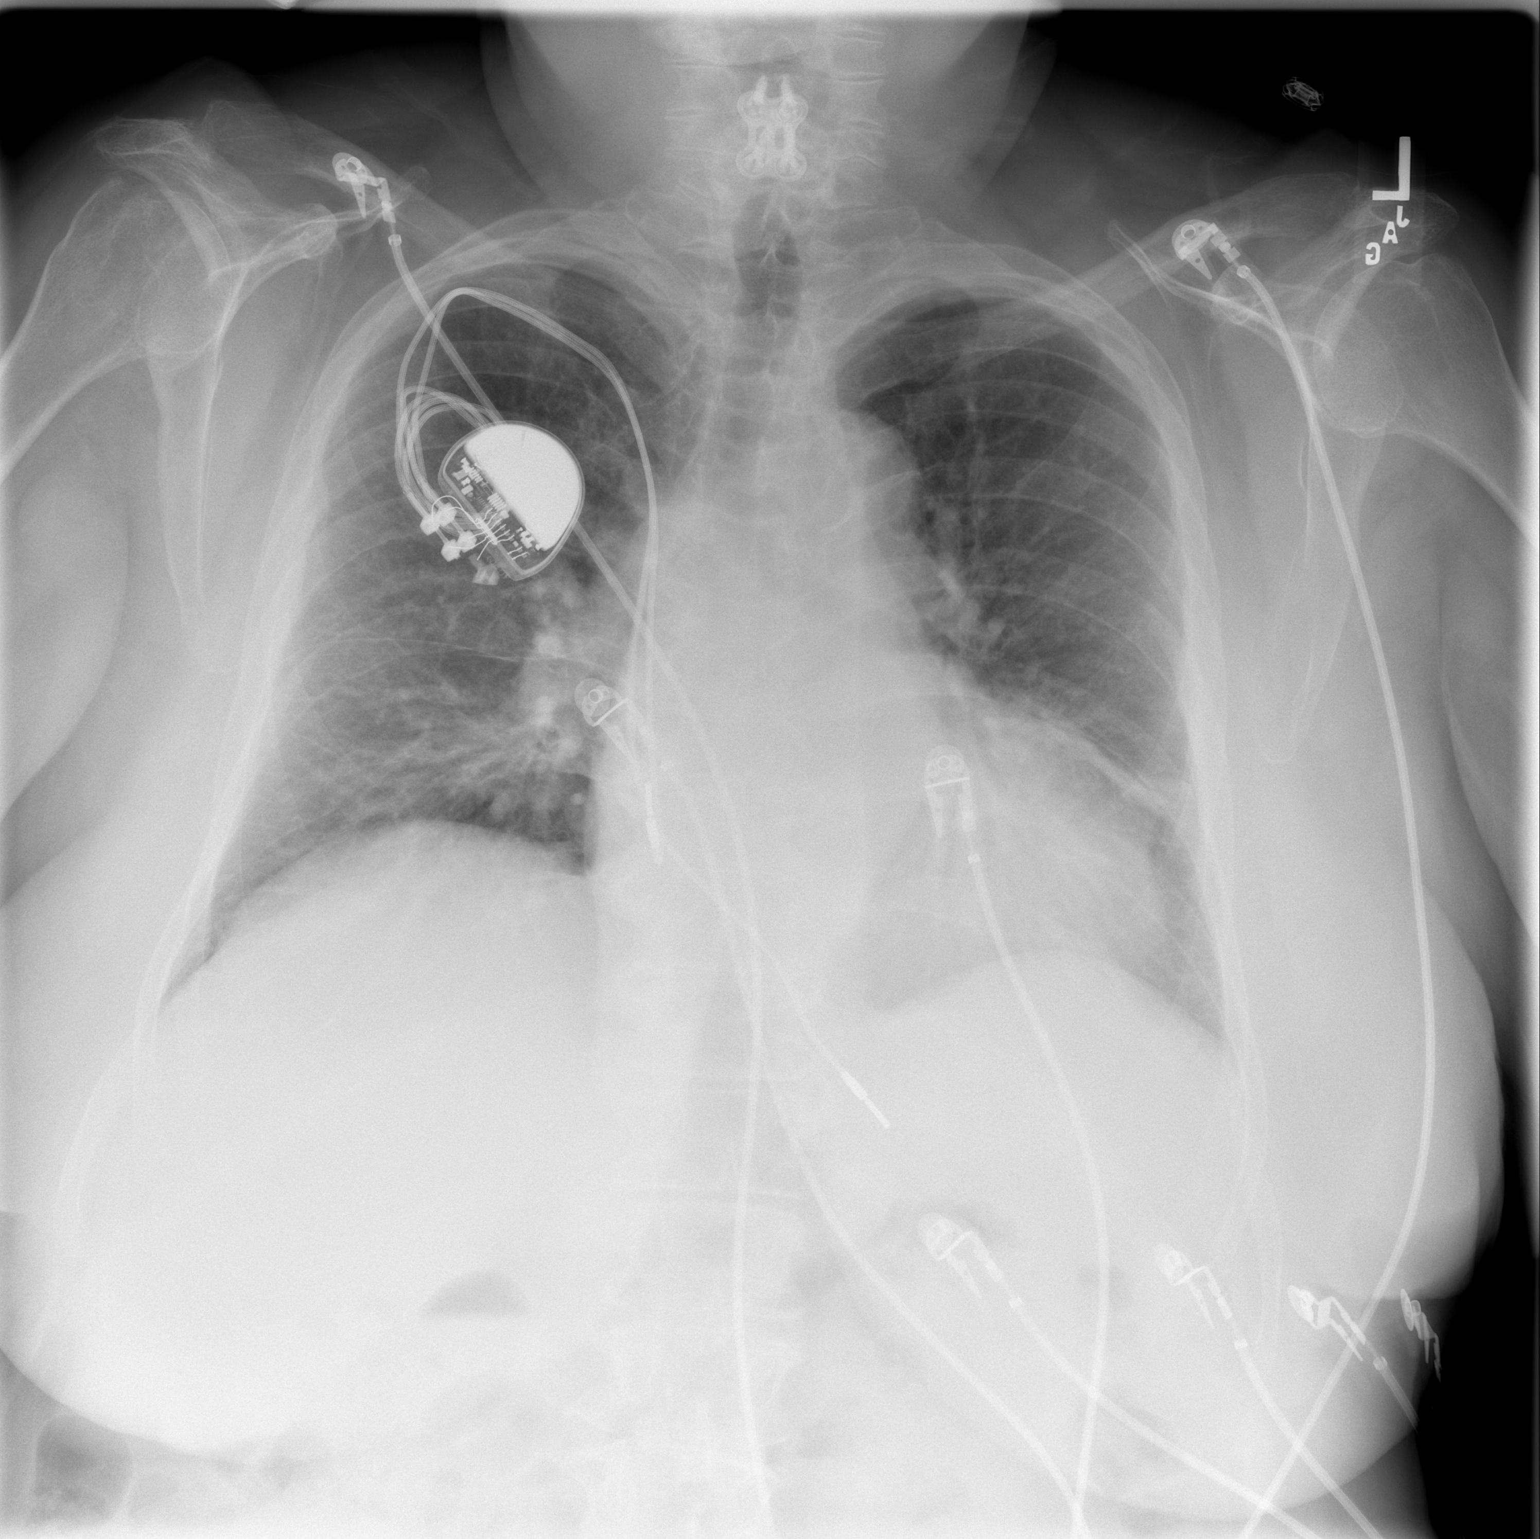

[w chest lat]
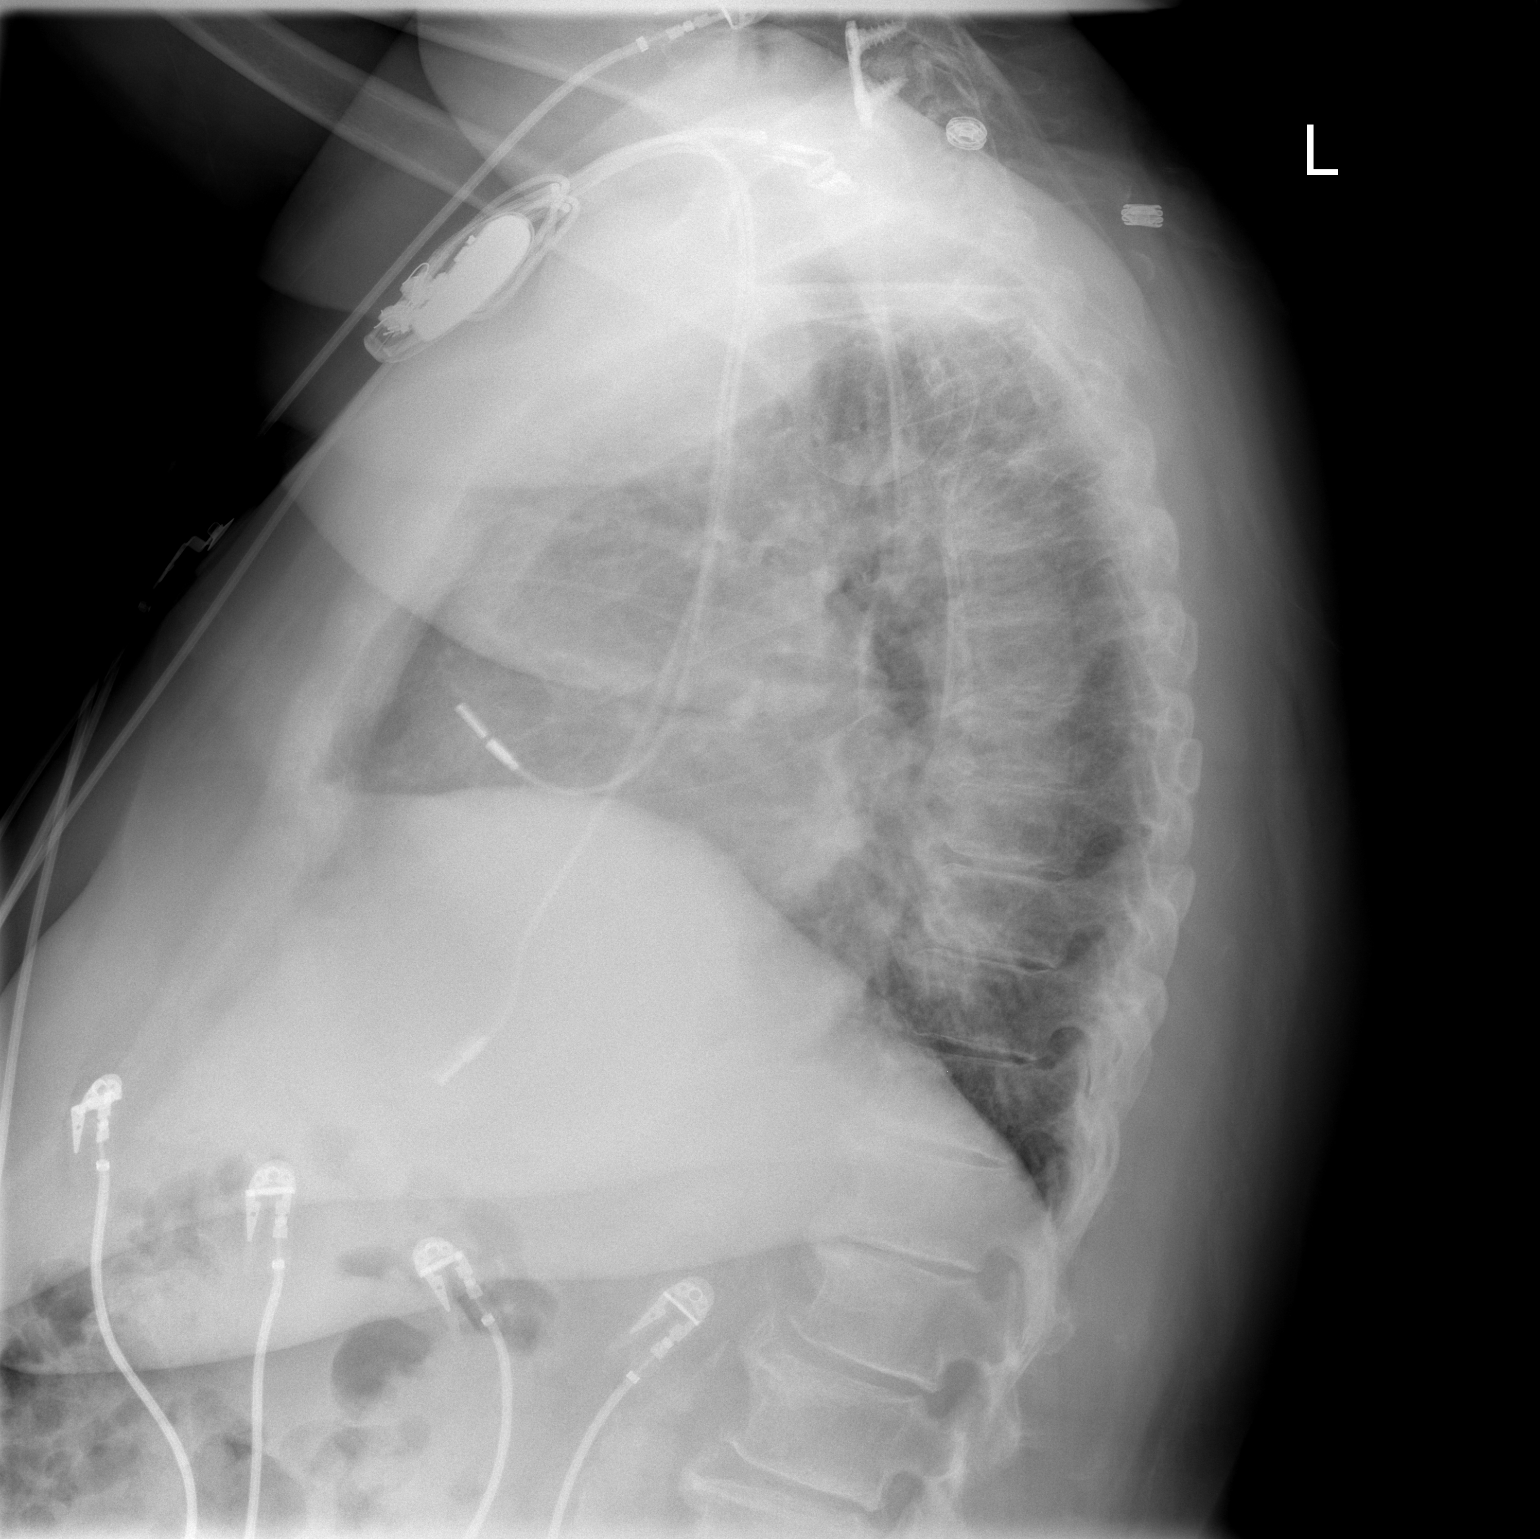

[2 of 2 positions shown; findings below may reference images not displayed]

FINDINGS: Cardiac silhouette, mediastinal and hilar contours are stable. Low lung volumes with vascular crowding and atelectasis.  There are also underlying basilar scarring changes.  There are thickened interseptal lines and peribronchial thickening.  The findings could be secondary to overlying bronchitis or mild interstitial edema. No effusions are seen.  No focal infiltrates.
IMPRESSION: 1.  Low volume chest film with vascular crowding and atelectasis.  There is also underlying basilar scarring changes. 
 2.  Mild stable cardiac enlargement. 
 3.  Peribronchial thickening and increased interstitial markings right lung greater than left.  This could reflect mild asymmetric interstitial edema or bronchitis.

## 2007-10-26 IMAGING — CT CT ANGIO CHEST
2 of 5 series · 19 of 36 positions shown · IV contrast (omnipaque)
Comparison: 02/07/06.

CLINICAL DATA: 61 year-old, chest pain, unstable angina.
 CT ANGIOGRAPHY OF CHEST:
TECHNIQUE: Multidetector CT imaging of the chest was performed during bolus injection of intravenous contrast.  Multiplanar CT angiographic image reconstructions were generated to evaluate the vascular anatomy.
 Contrast:  100 cc Omnipaque 300 was used.

[Series 7: pulm embolism 1.0 b25f thins · axial · 0.64mm/px · z∈[+1330,+1556]mm · 16 of 257 slices shown]
[im 16/257  lung]
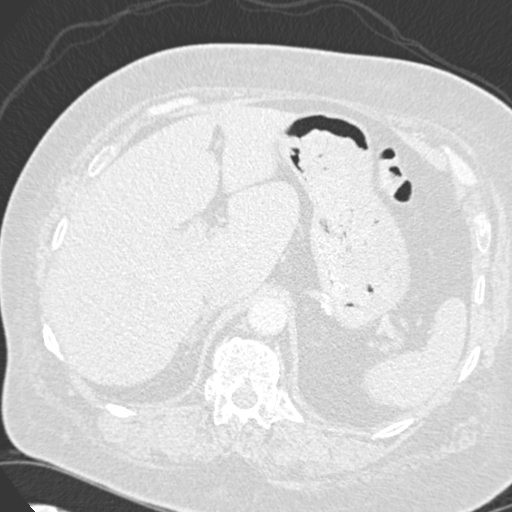
[im 31/257  mediastinal]
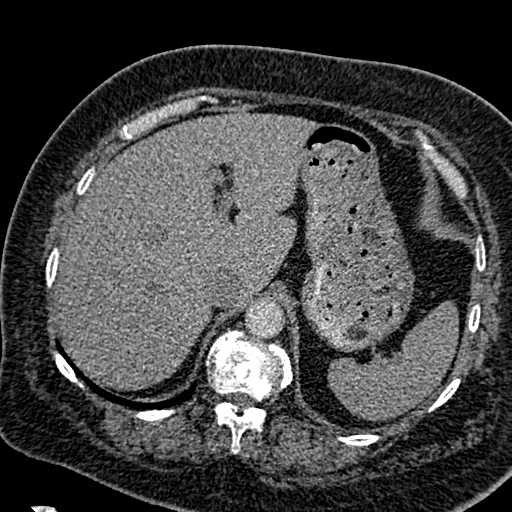
[im 46/257  lung]
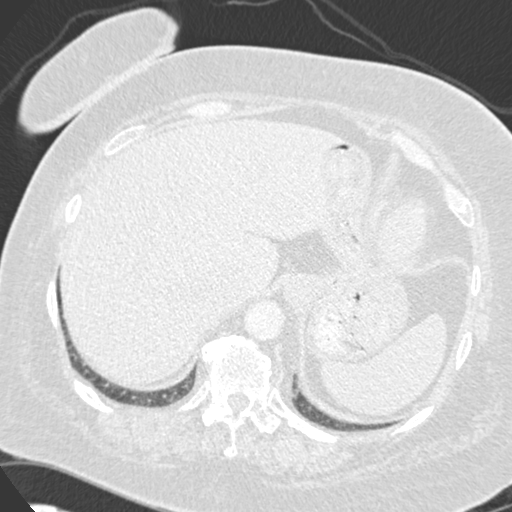
[im 61/257  mediastinal]
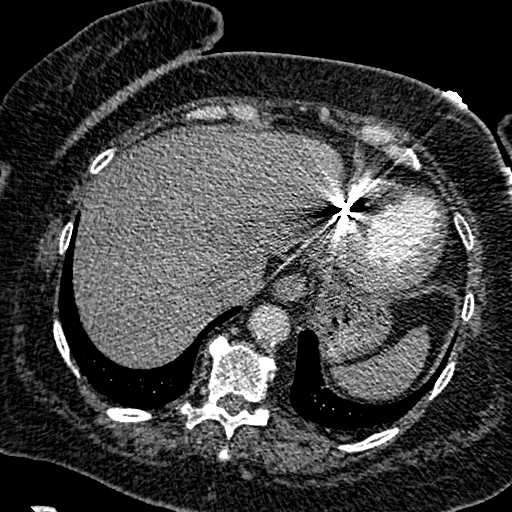
[im 76/257  lung]
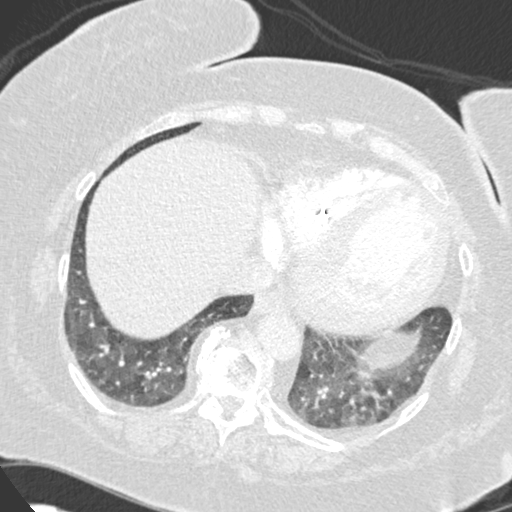
[im 91/257  mediastinal]
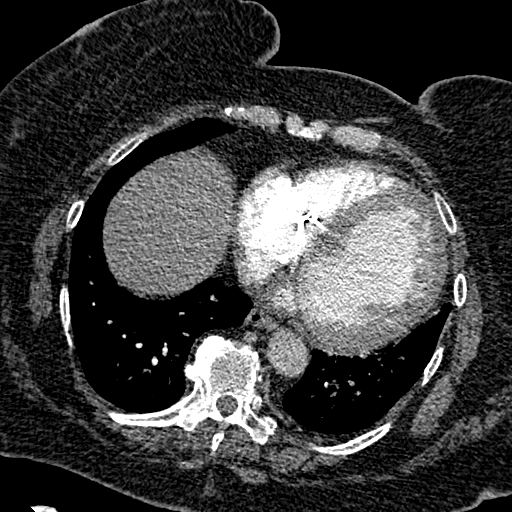
[im 106/257  lung]
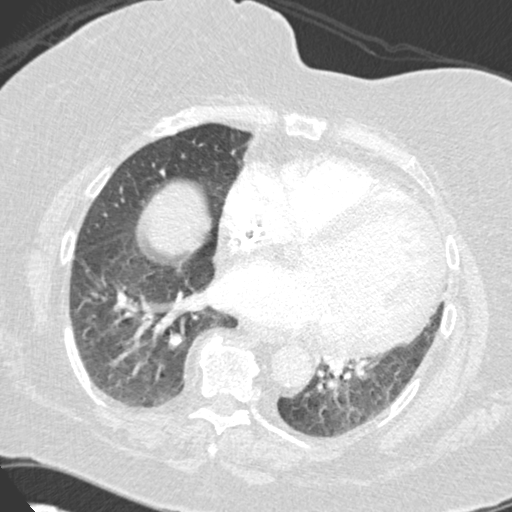
[im 121/257  mediastinal]
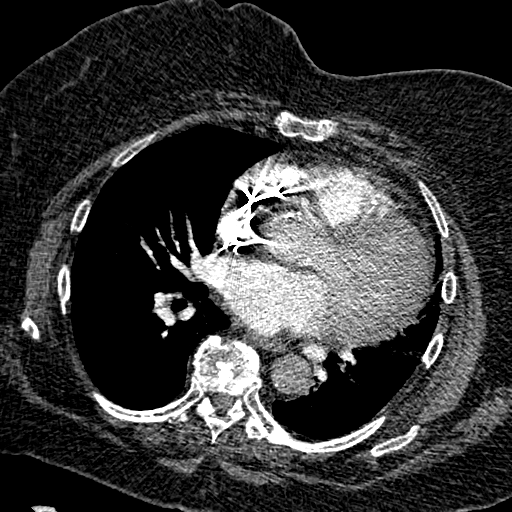
[im 136/257  lung]
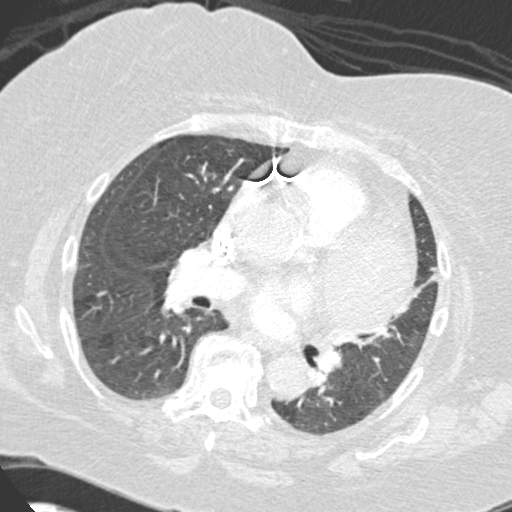
[im 151/257  mediastinal]
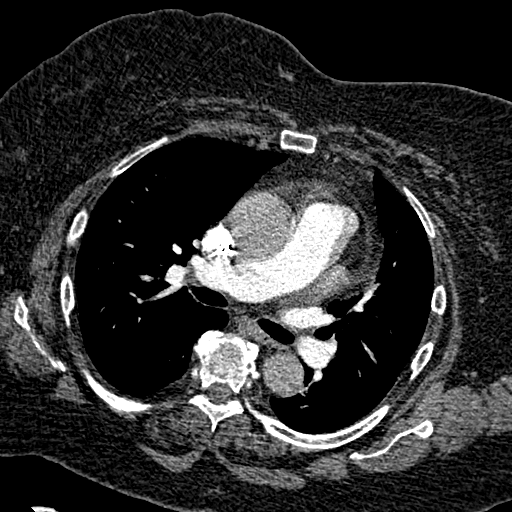
[im 166/257  lung]
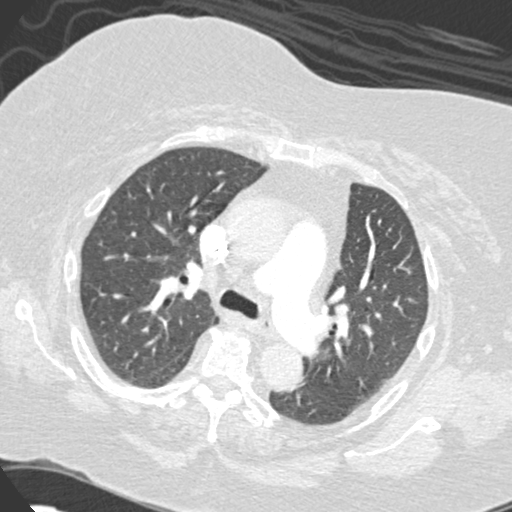
[im 181/257  mediastinal]
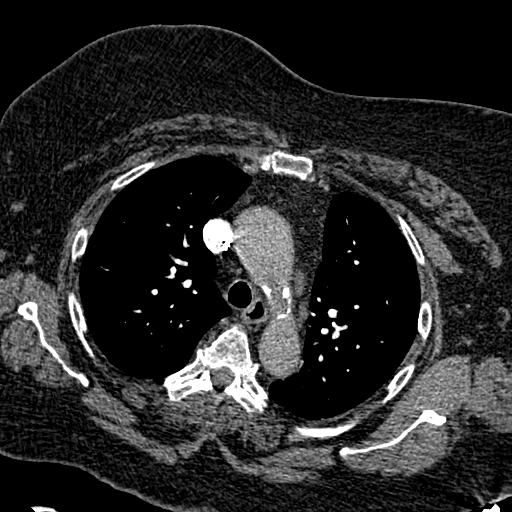
[im 196/257  lung]
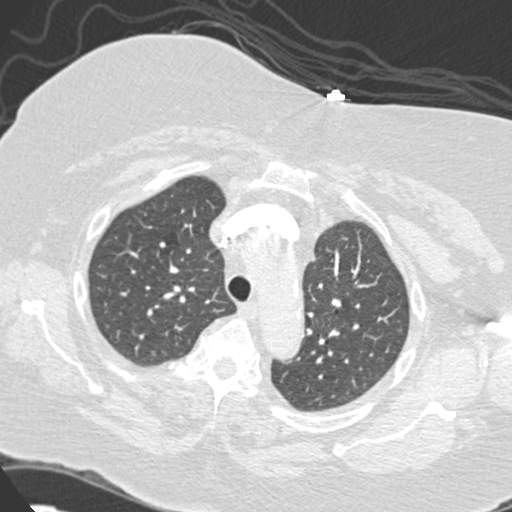
[im 211/257  mediastinal]
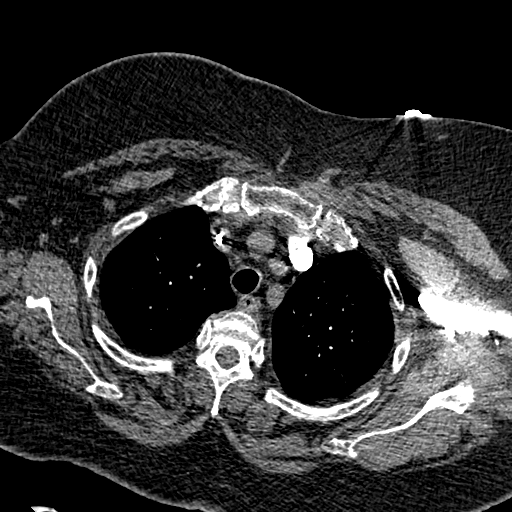
[im 226/257  lung]
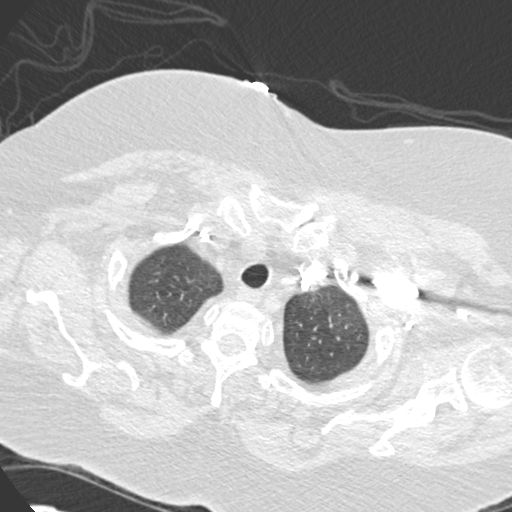
[im 241/257  mediastinal]
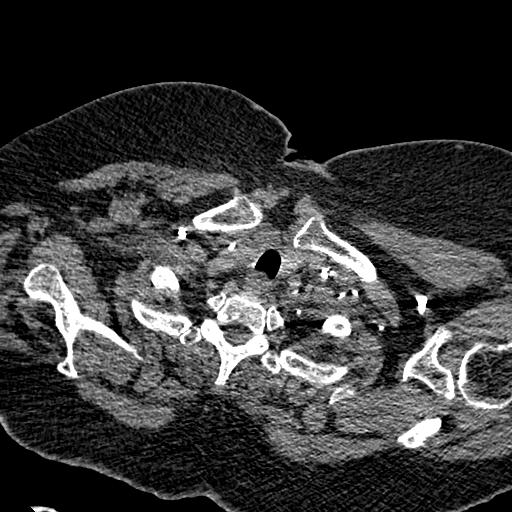

[Series 8: pulm embolism 2.0 spo thins cr · coronal · 0.70mm/px · 3 of 117 slices shown]
[im 24/117  mediastinal]
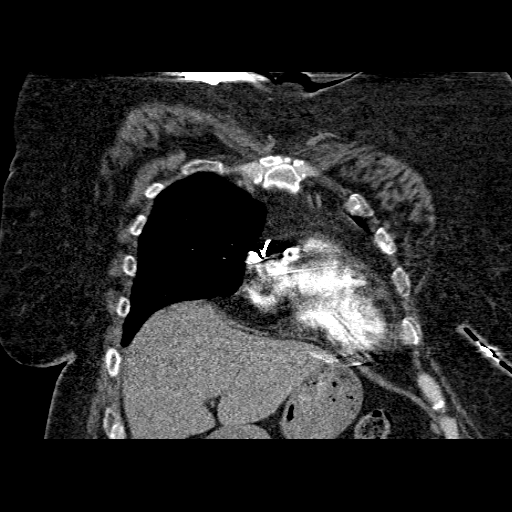
[im 47/117  mediastinal]
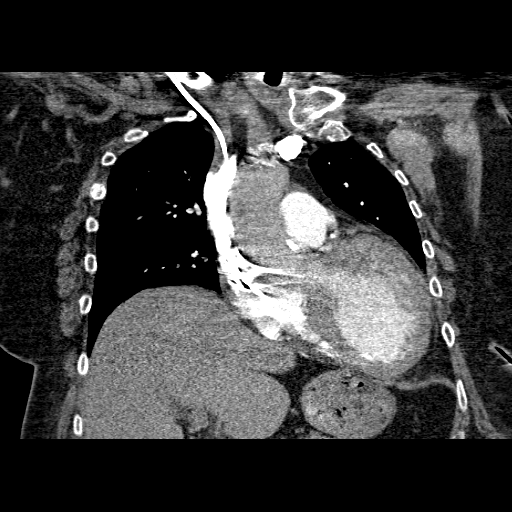
[im 70/117  mediastinal]
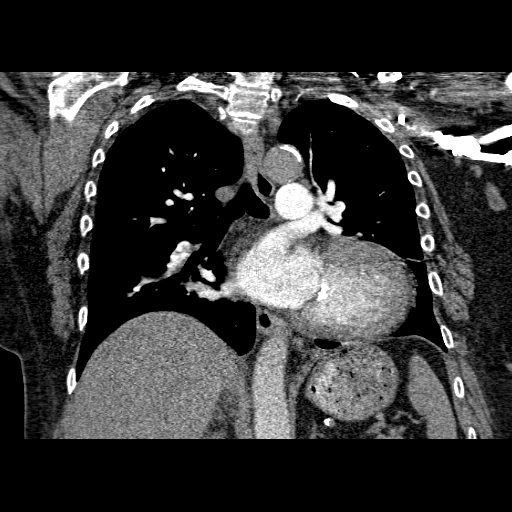

[19 of 36 positions shown; findings below may reference images not displayed]

FINDINGS: Chest wall, soft tissues and bony structures are unremarkable and stable. There are a few scattered axillary lymph nodes, but no adenopathy.  No obvious breast masses.
 Heart size is stable.  No pericardial effusion.  No mediastinal or hilar adenopathy.
 The aorta is normal in caliber.  Scattered atherosclerotic calcifications are stable.  The pulmonary arterial tree is fairly well opacified and no filling defects are seen to suggest pulmonary emboli.
 Esophagus is unremarkable except for a small hiatal hernia. Pacer wires are noted.
 Examination of the lung parenchyma demonstrates patchy ground glass opacity in the lower lung zones.  Findings could reflect mild edema or other partial airspace filling process such as an inflammatory or infectious alveolitis. No overt pulmonary edema and no pleural effusions.  No worrisome masses or focal airspace consolidation.  The tracheobronchial tree is grossly normal.
 The upper abdomen demonstrates no significant findings.
IMPRESSION: 1.  No CT evidence for pulmonary emboli.
 2.  Atherosclerotic change involving the aorta but no obvious dissection.
 3.  Patchy lower lobe ground glass opacity suggesting a partial airspace filling process such as mild edema or possibly inflammatory or infectious alveolitis. No focal pneumonia. No pleural effusions.  No worrisome masses or nodules.

## 2007-10-31 IMAGING — CR DG CHEST 2V
2 series · 2 of 2 positions shown · non-contrast
Comparison: 10/29/2006.

CLINICAL DATA: Status post AICD insertion. Admitted with chest pain and unstable
angina.

CHEST - 2 VIEW

[w chest pa]
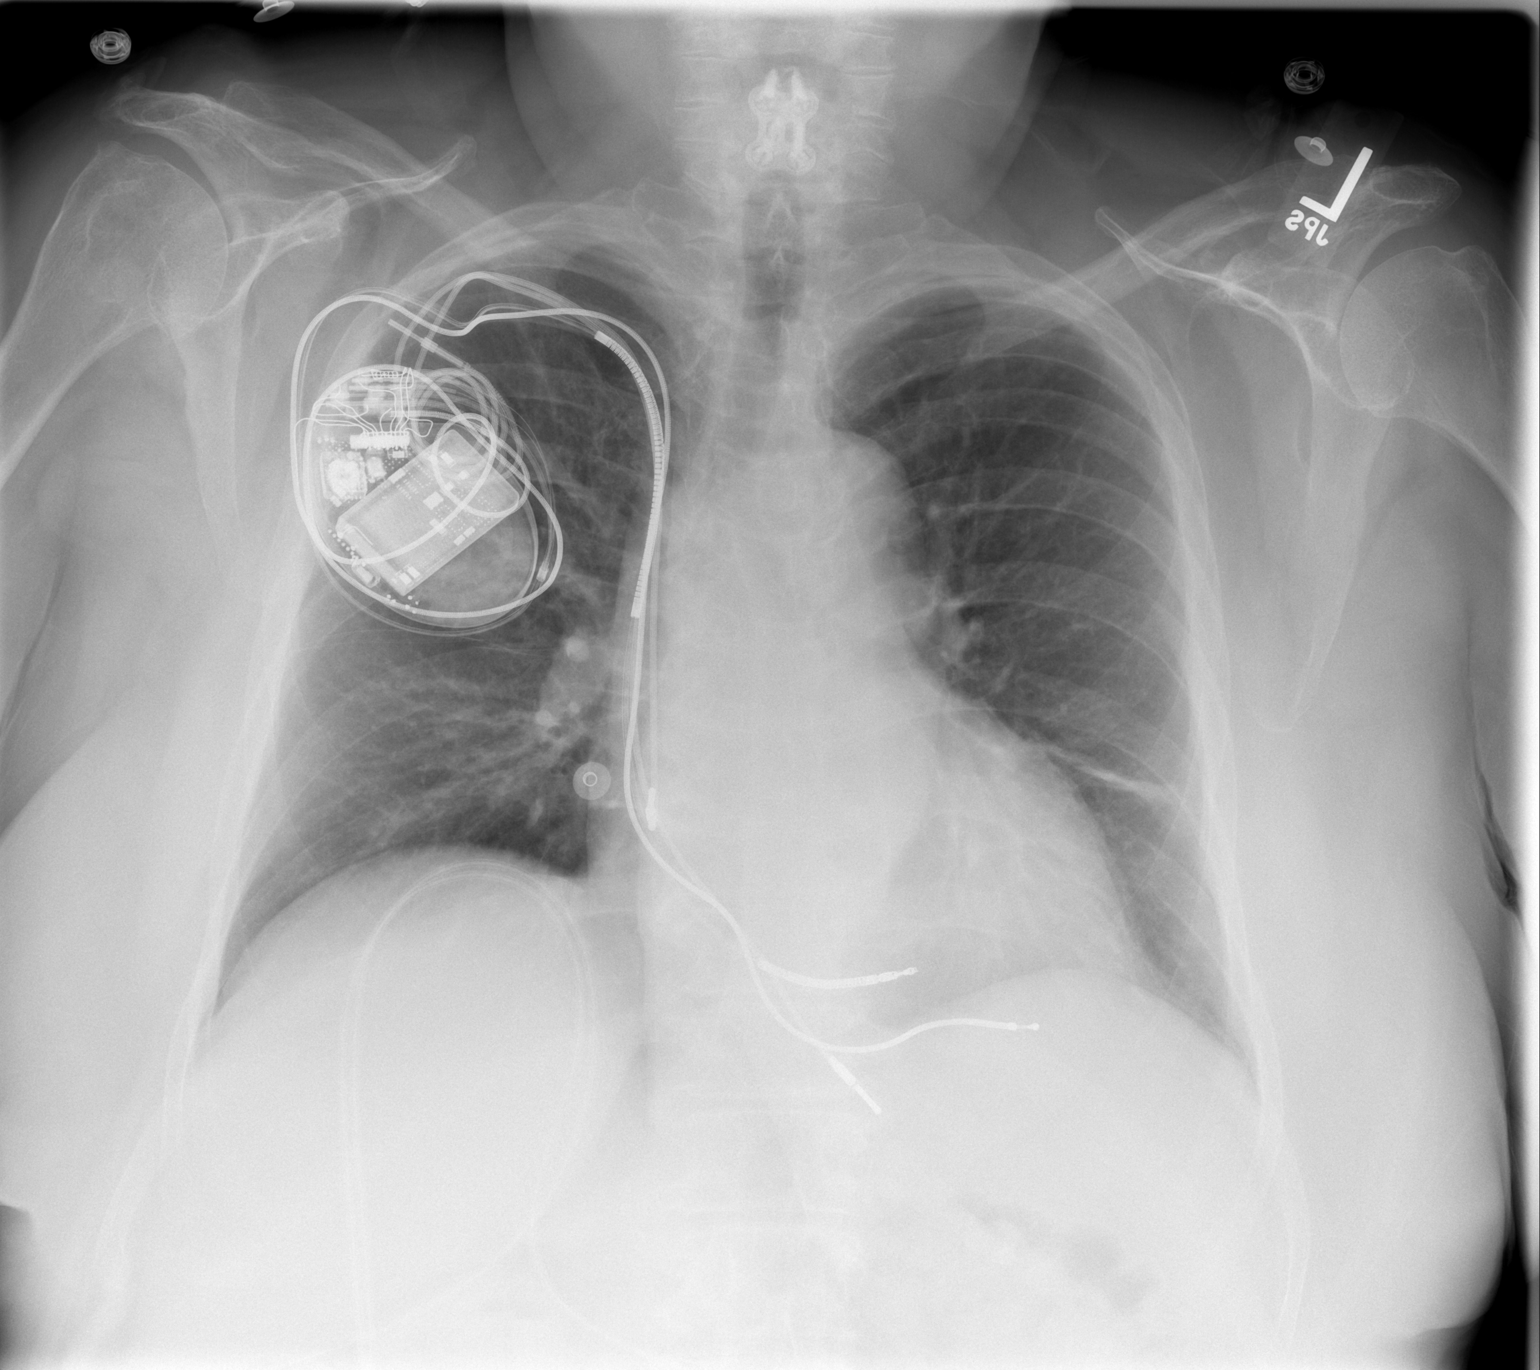

[w chest lat]
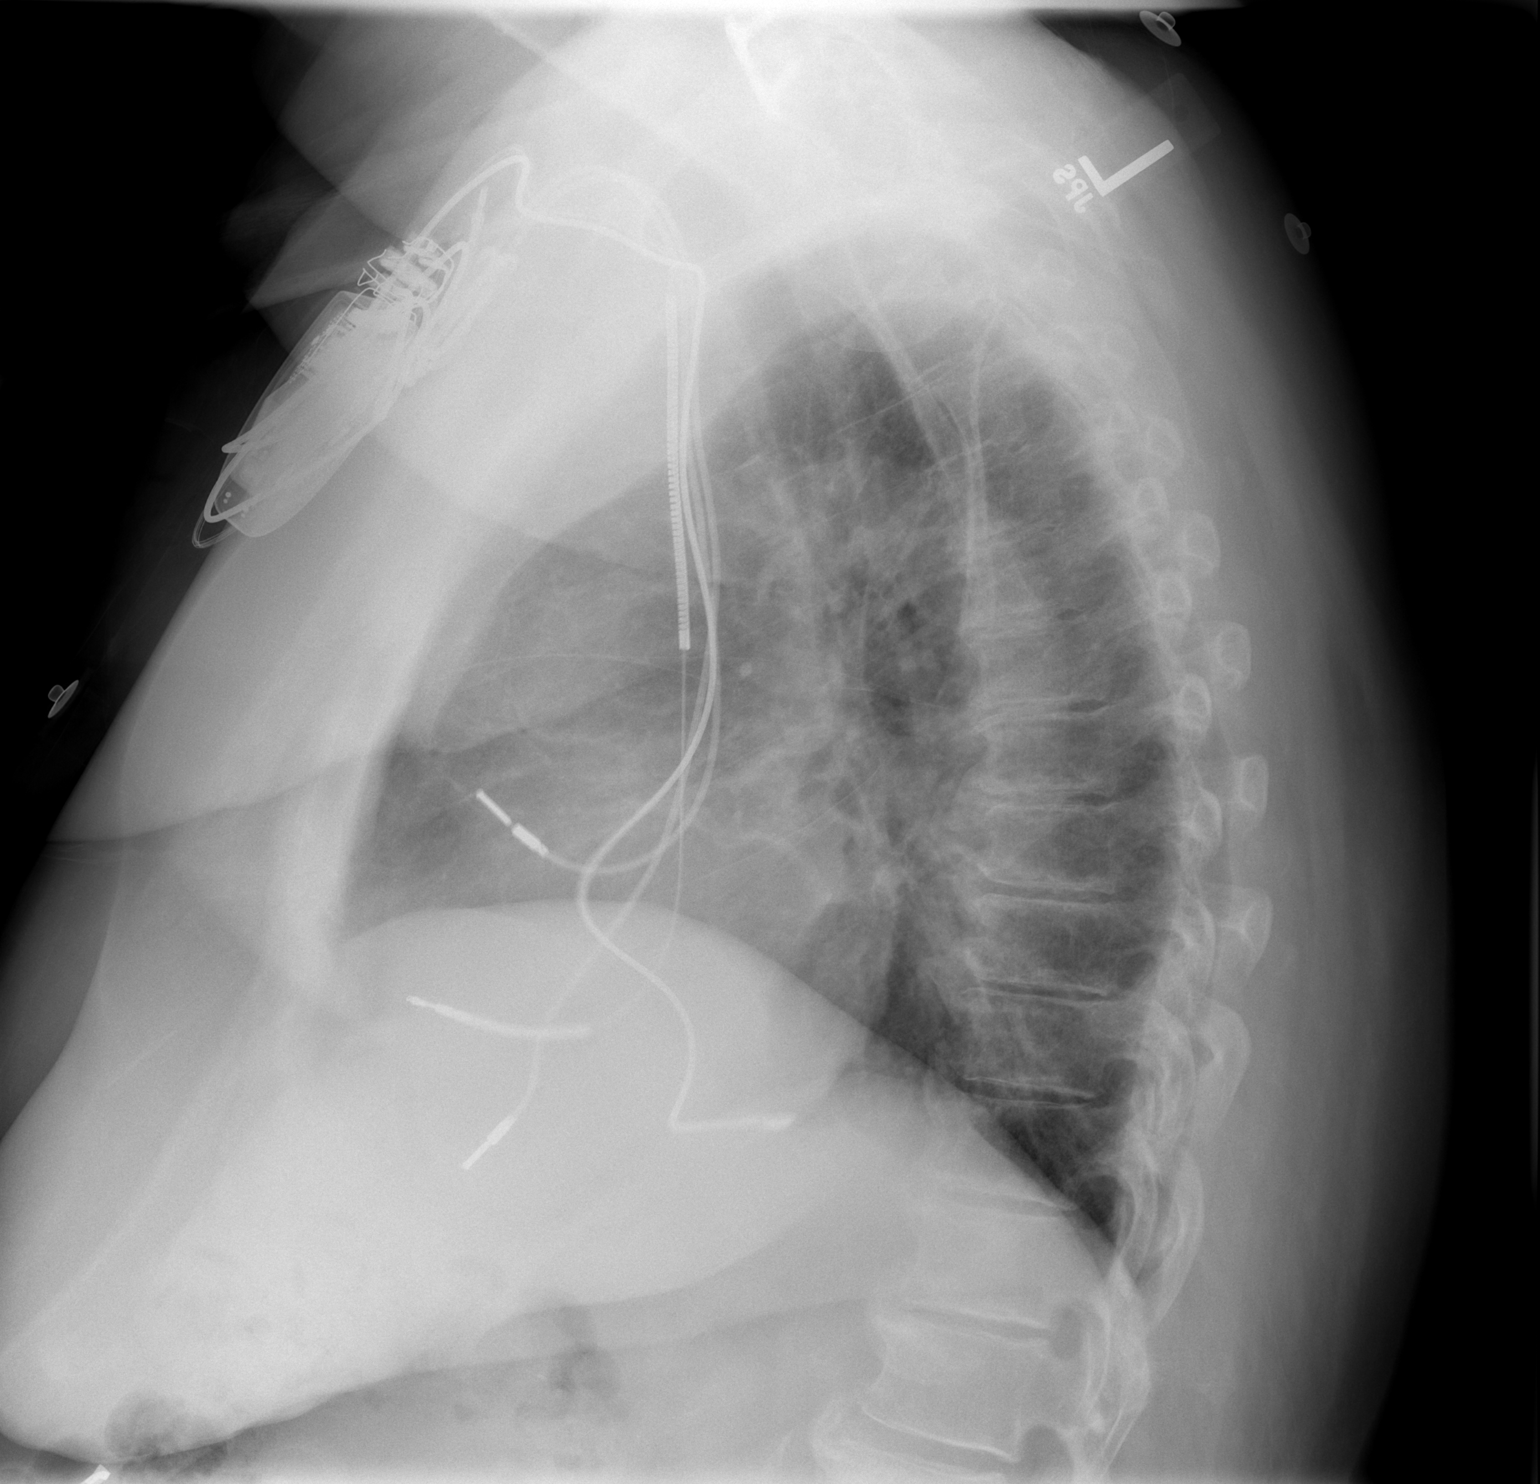

[2 of 2 positions shown; findings below may reference images not displayed]

FINDINGS: The cardiac silhouette remains borderline enlarged and the aorta
remains tortuous. Decreased bilateral atelectasis. Interval right AICD lead with
its tip in the expected position of the medial aspect of the right ventricle.
Stable right subclavian pacemaker leads. No pneumothorax seen. Stable cervical
spine hardware and right shoulder degenerative changes.

IMPRESSION

1. Right AICD lead, as described above, without pneumothorax.
2. Decreased bilateral atelectasis.

## 2007-11-16 IMAGING — CR DG CHEST 2V
2 series · 2 of 2 positions shown · non-contrast
Comparison: 11/05/2006

CLINICAL DATA: Cough. Fever.

CHEST - 2 VIEW

[w chest pa]
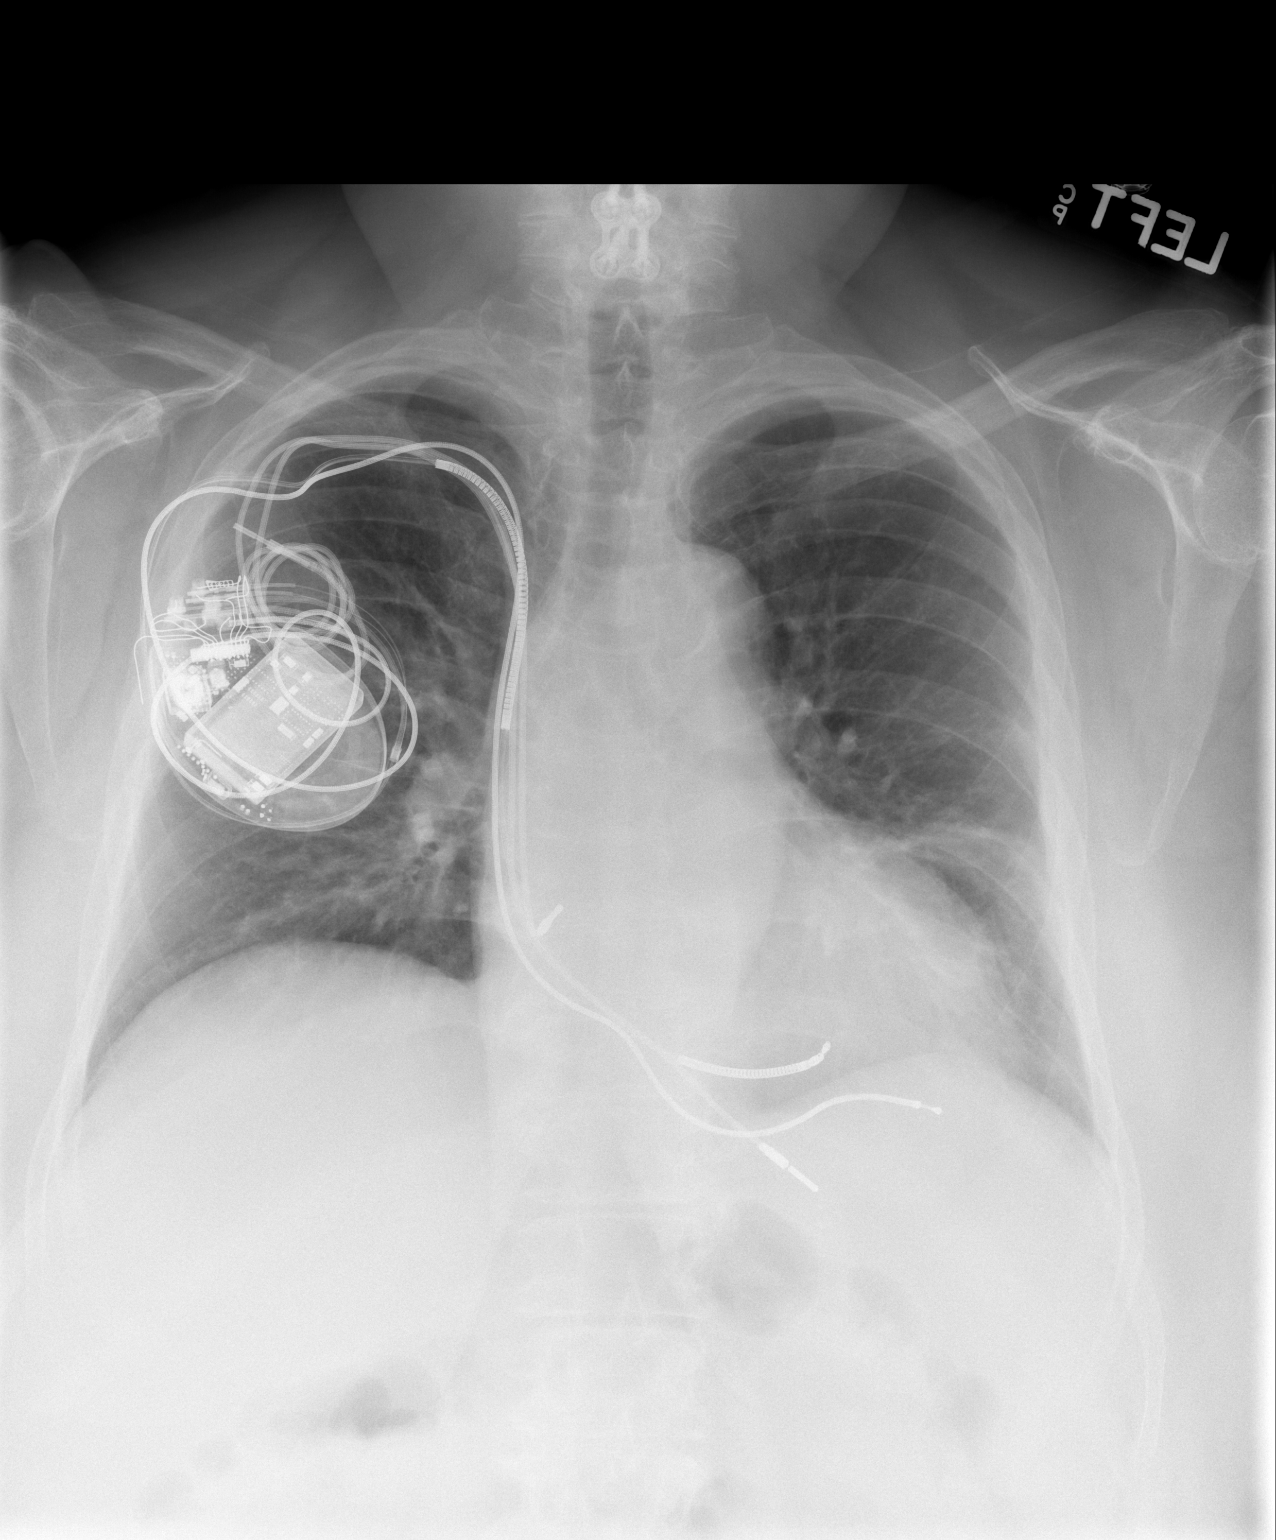

[w chest lat]
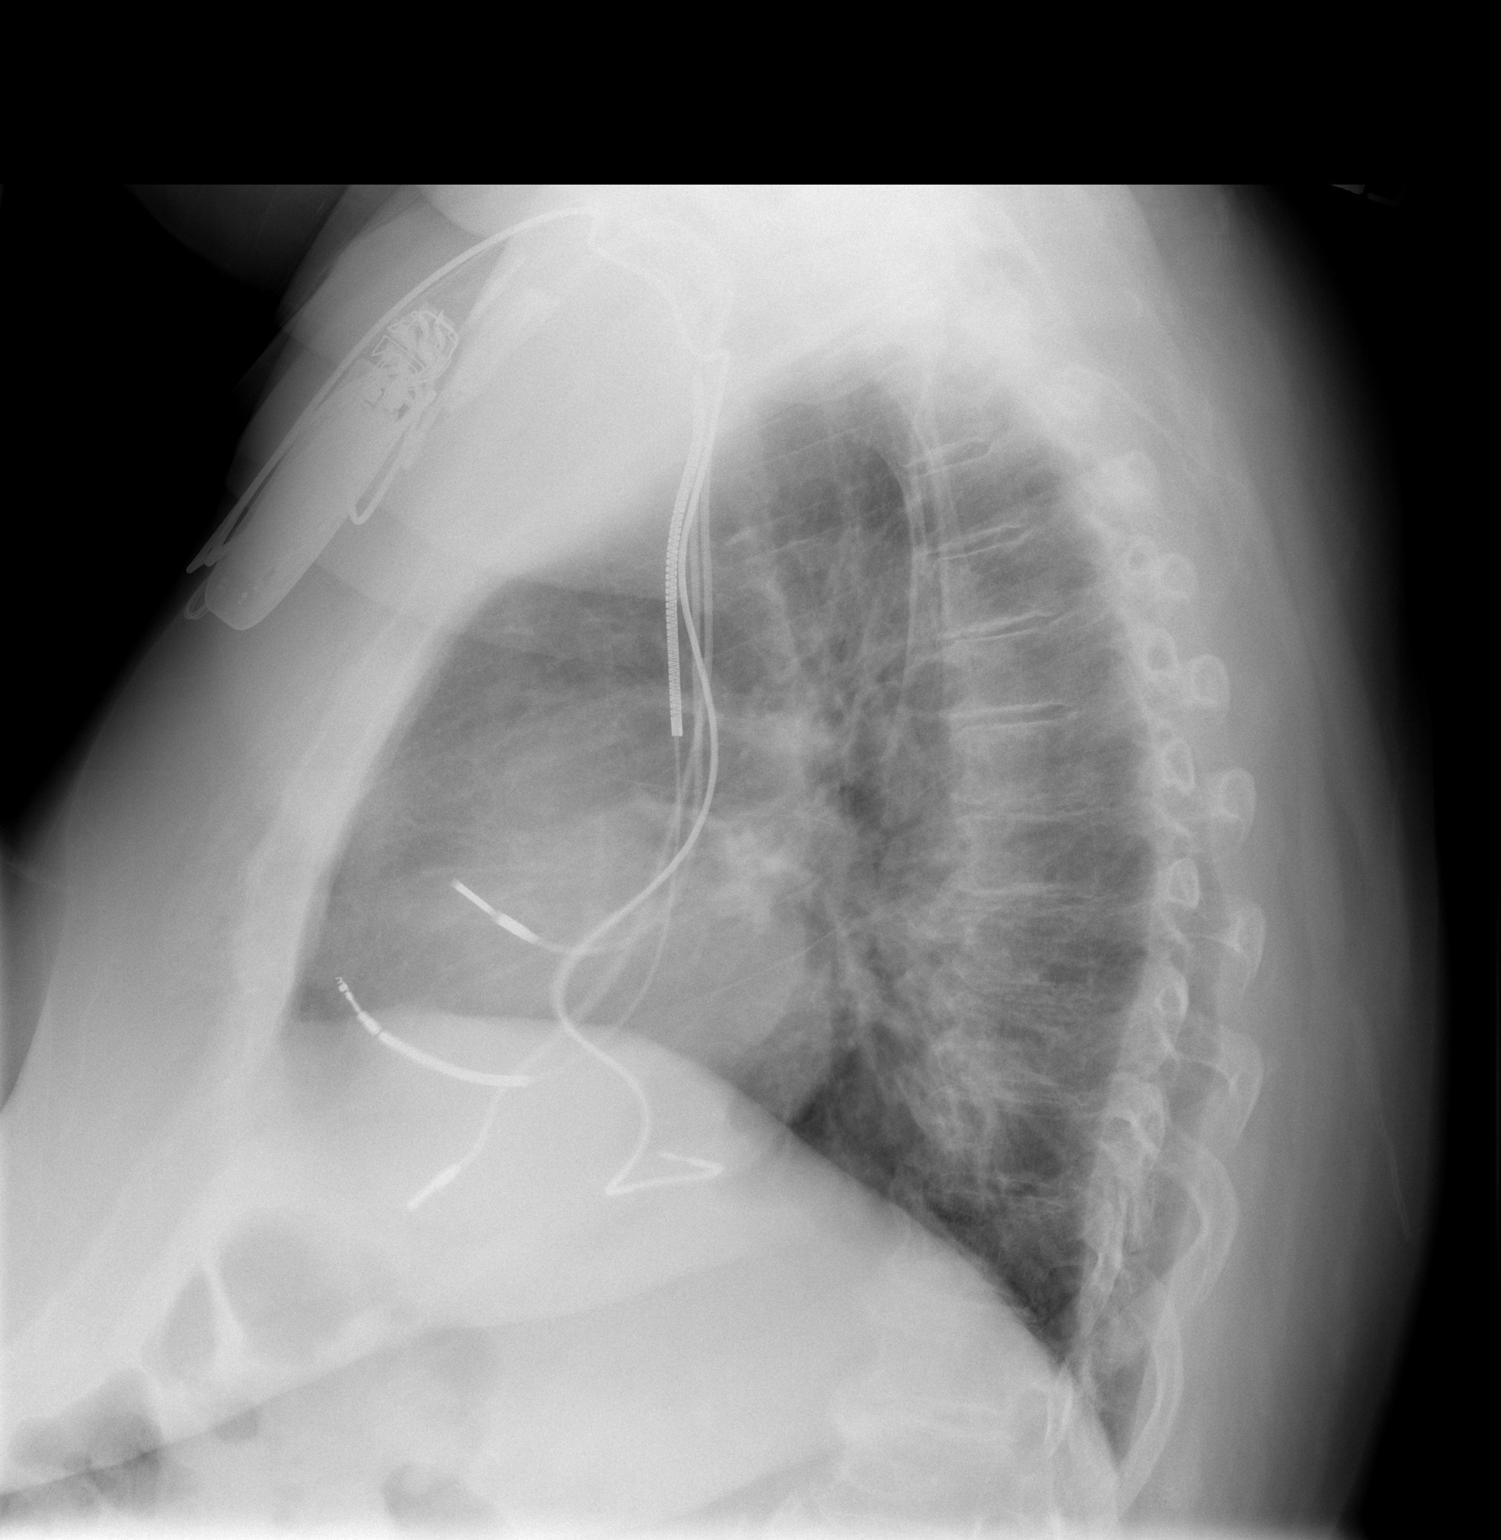

[2 of 2 positions shown; findings below may reference images not displayed]

FINDINGS: Stable lingular scar noted. AICD remains in place. Mildly low lung
volumes are present, causing crowding of the pulmonary vasculature. There is
indistinct increased density below the region of lingular scarring, suspicious
mild left lower lobe bronchopneumonia.

IMPRESSION

1. Mild retrocardiac opacities suggest left lower lobe bronchopneumonia.

## 2008-02-11 HISTORY — PX: CORONARY ANGIOGRAM: SHX5786

## 2008-02-18 ENCOUNTER — Inpatient Hospital Stay (HOSPITAL_COMMUNITY): Admission: EM | Admit: 2008-02-18 | Discharge: 2008-02-19 | Payer: Self-pay | Admitting: Emergency Medicine

## 2008-02-18 HISTORY — PX: CARDIAC CATHETERIZATION: SHX172

## 2008-03-27 ENCOUNTER — Encounter (INDEPENDENT_AMBULATORY_CARE_PROVIDER_SITE_OTHER): Payer: Self-pay | Admitting: *Deleted

## 2008-03-27 ENCOUNTER — Encounter: Admission: RE | Admit: 2008-03-27 | Discharge: 2008-03-27 | Payer: Self-pay | Admitting: Family Medicine

## 2008-05-30 IMAGING — CR DG WRIST COMPLETE 3+V*L*
2 series · 2 of 2 positions shown · non-contrast
Comparison: None

CLINICAL DATA: Fall

LEFT WRIST - COMPLETE 3+ VIEW

[view not recorded (1 of 2)]
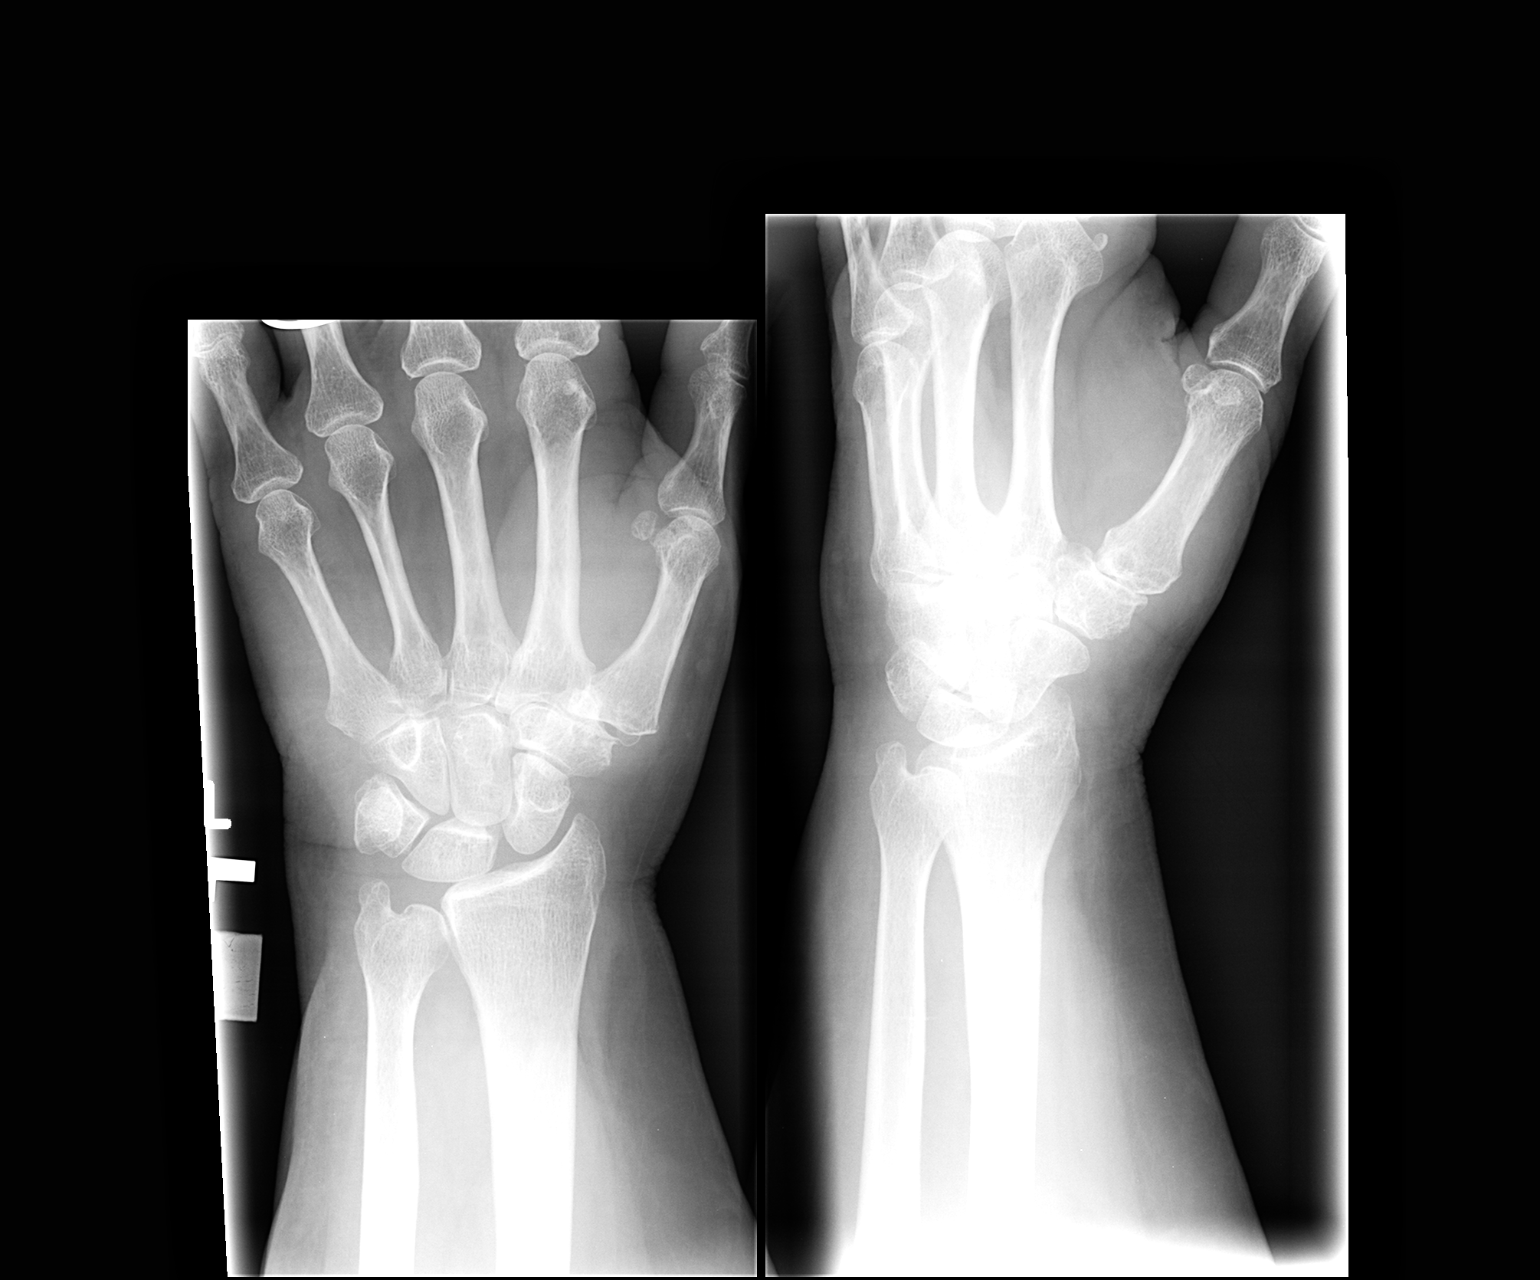

[view not recorded (2 of 2)]
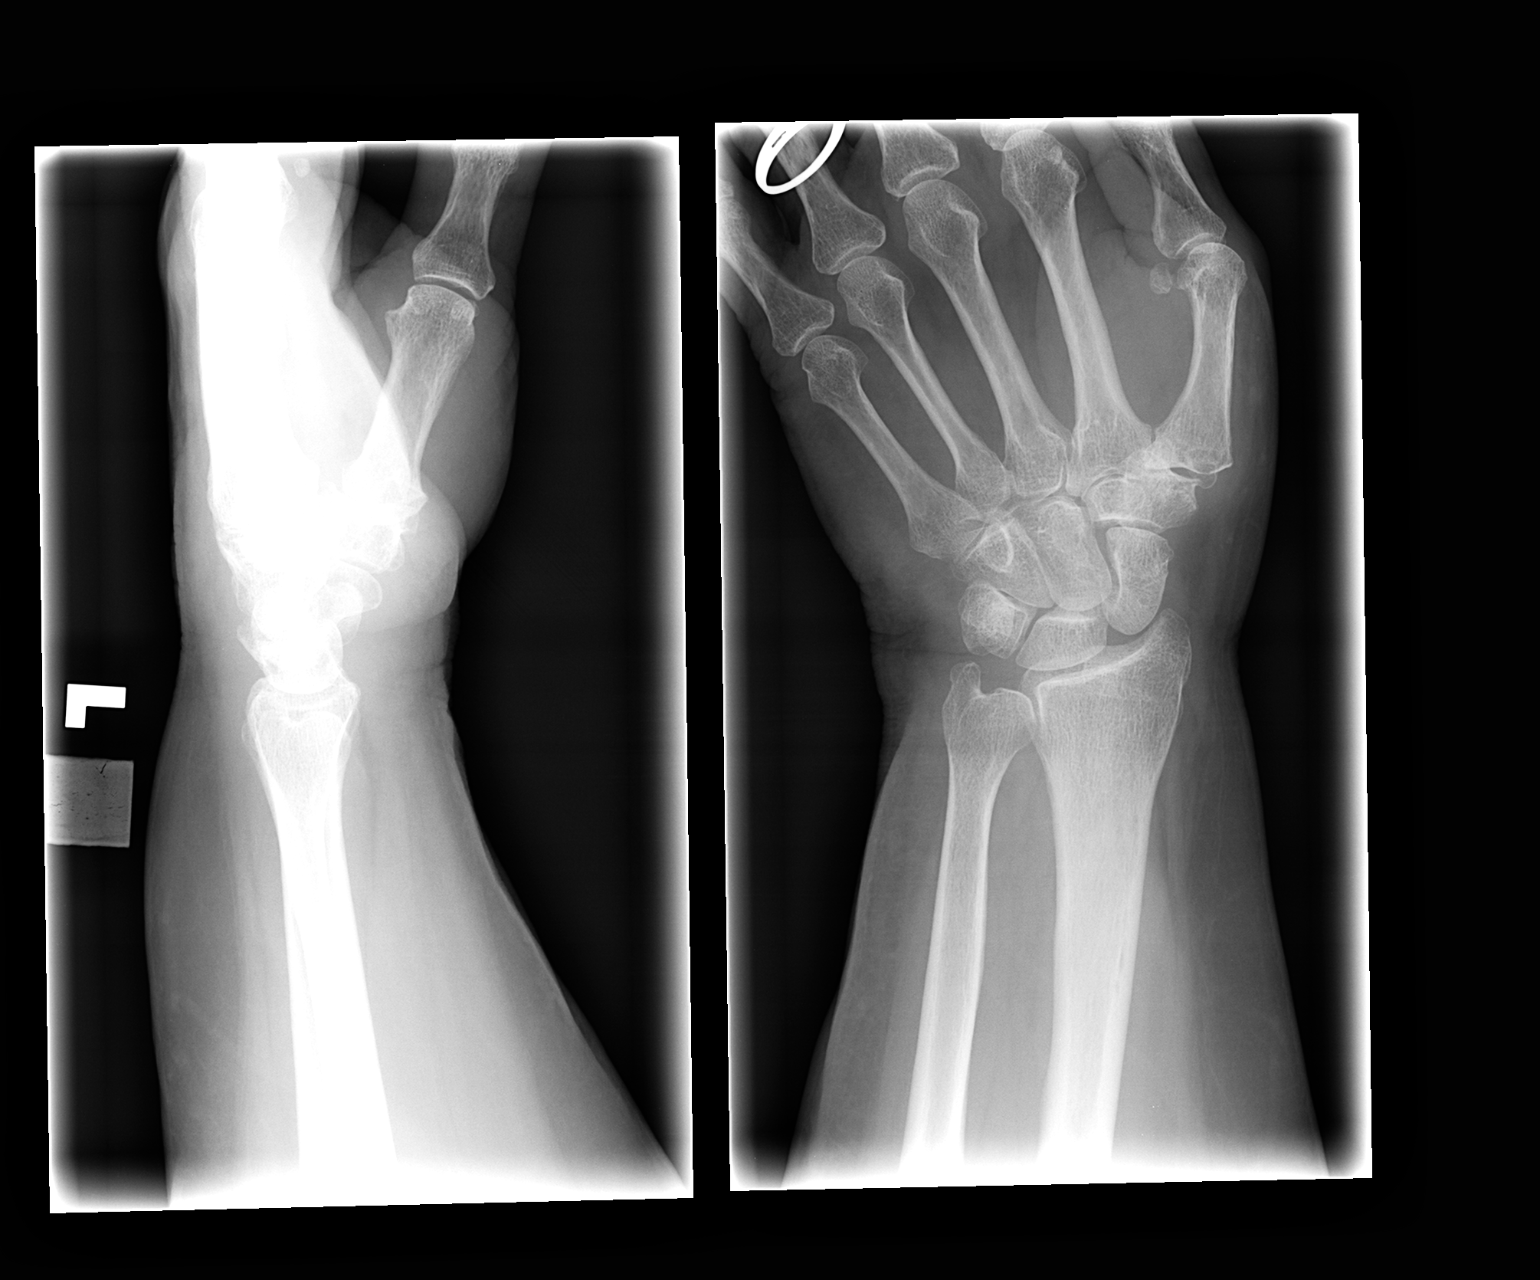

[2 of 2 positions shown; findings below may reference images not displayed]

FINDINGS: No fracture.  Increased distance between the scaphoid
and lunate bones is noted.  Although this may represent a normal
anatomic variant, also consider injury to the scapholunate
ligament.  Degenerative changes involving the first carpometacarpal
articulation.

No IMPRESSION:
Increased scapholunate interval potentially representing injury to
the scapholunate ligament.  See comments above

## 2008-10-15 IMAGING — CR DG CHEST 2V
2 series · 2 of 2 positions shown · non-contrast
Comparison: 04/24/2006

CLINICAL DATA: Preadmission radiograph

CHEST - 2 VIEW

[view not recorded (1 of 2)]
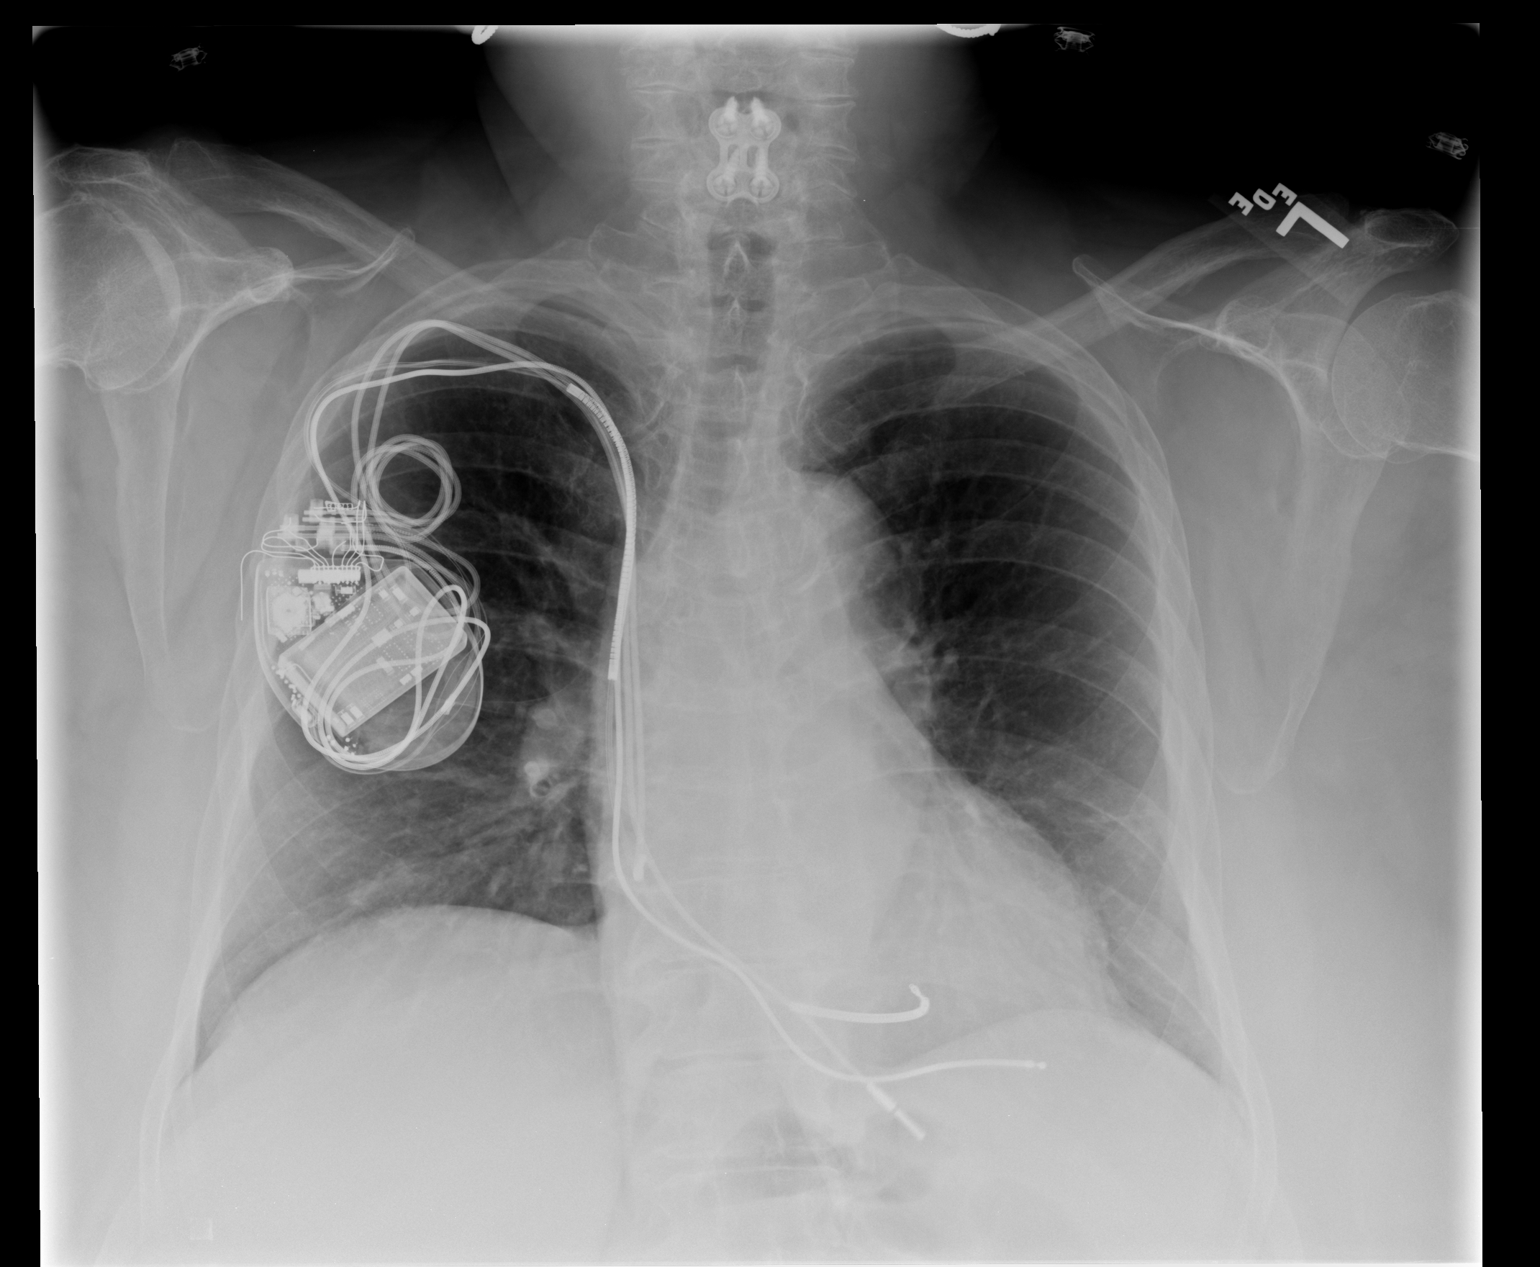

[view not recorded (2 of 2)]
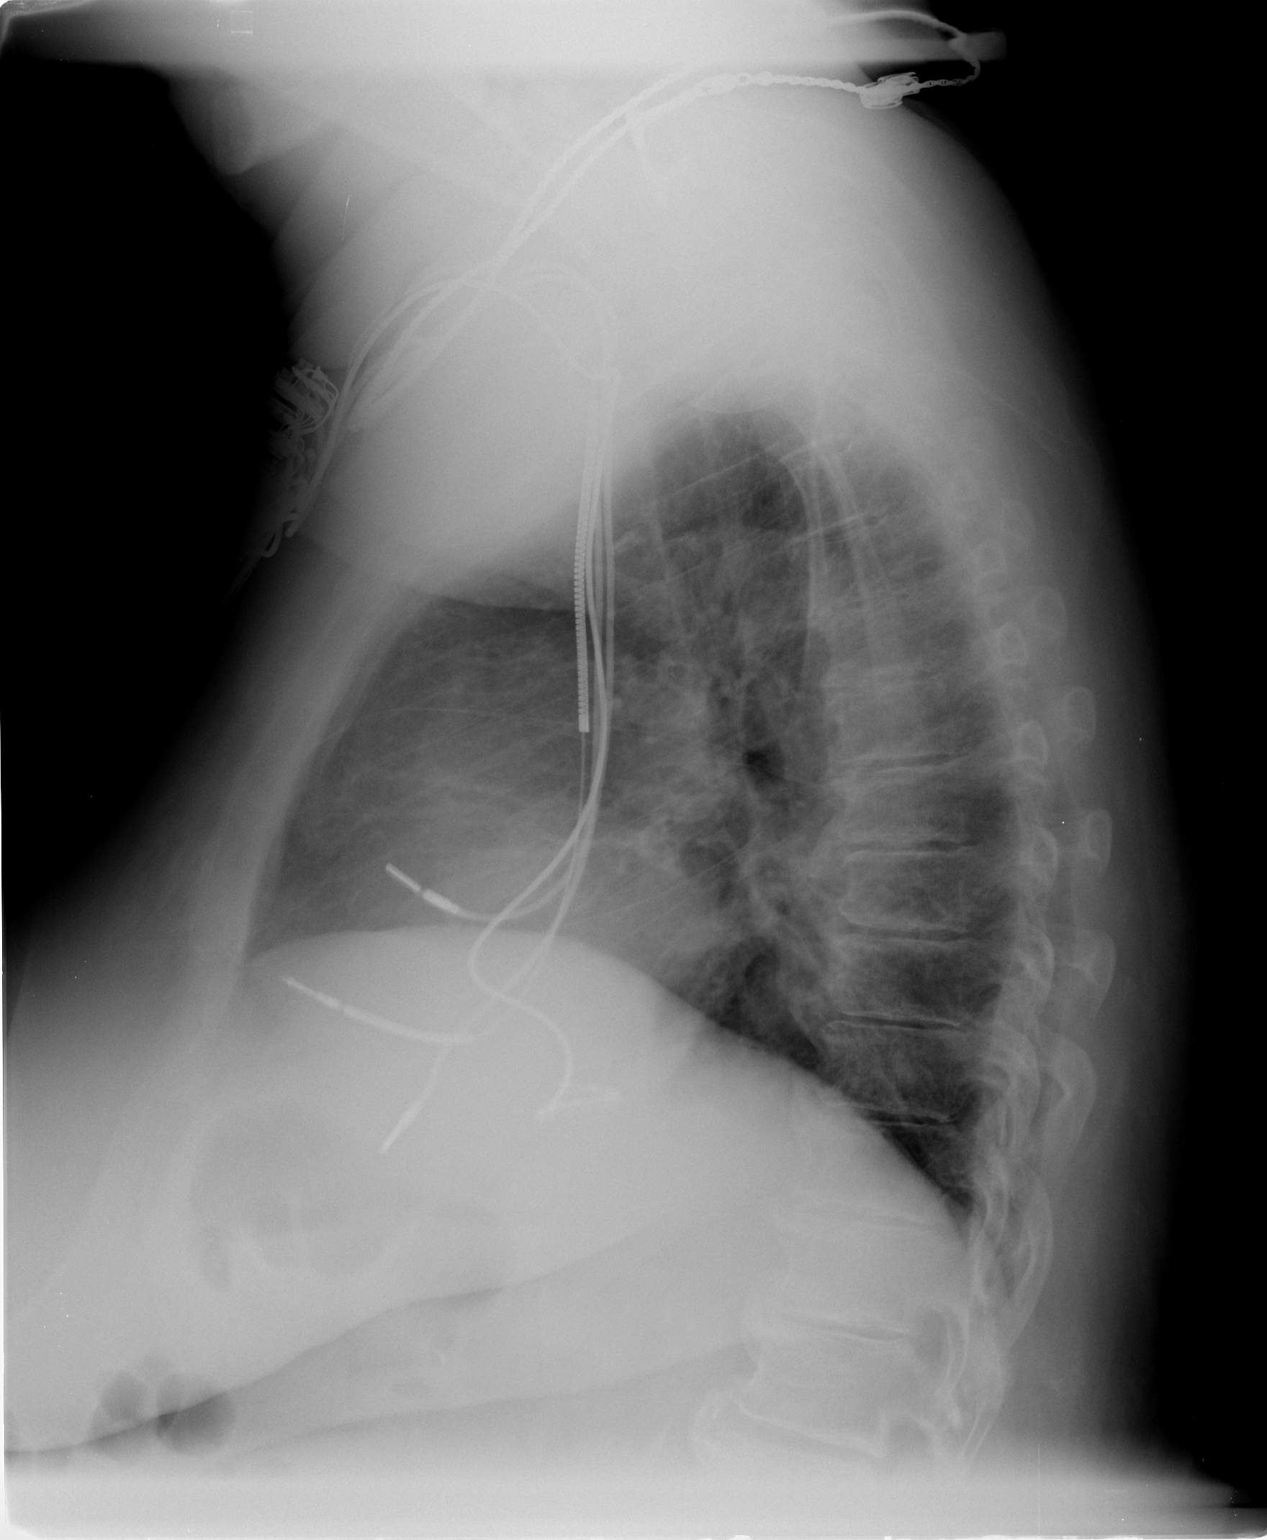

[2 of 2 positions shown; findings below may reference images not displayed]

FINDINGS: Heart size and mediastinal contours are unremarkable.

There is no pleural effusion or pulmonary interstitial edema

There is a right chest wall pacer device with leads in the right
atrial appendage and right ventricle.

Mild multilevel thoracic spondylosis is noted
IMPRESSION: 1.  No acute cardiopulmonary abnormalities.

## 2009-02-11 IMAGING — CR DG CHEST 1V PORT
1 series · 1 of 1 positions shown · non-contrast
Comparison: 10/20/2007.

CLINICAL DATA: Chest pain radiating to the left arm.

PORTABLE CHEST - 1 VIEW

[view not recorded]
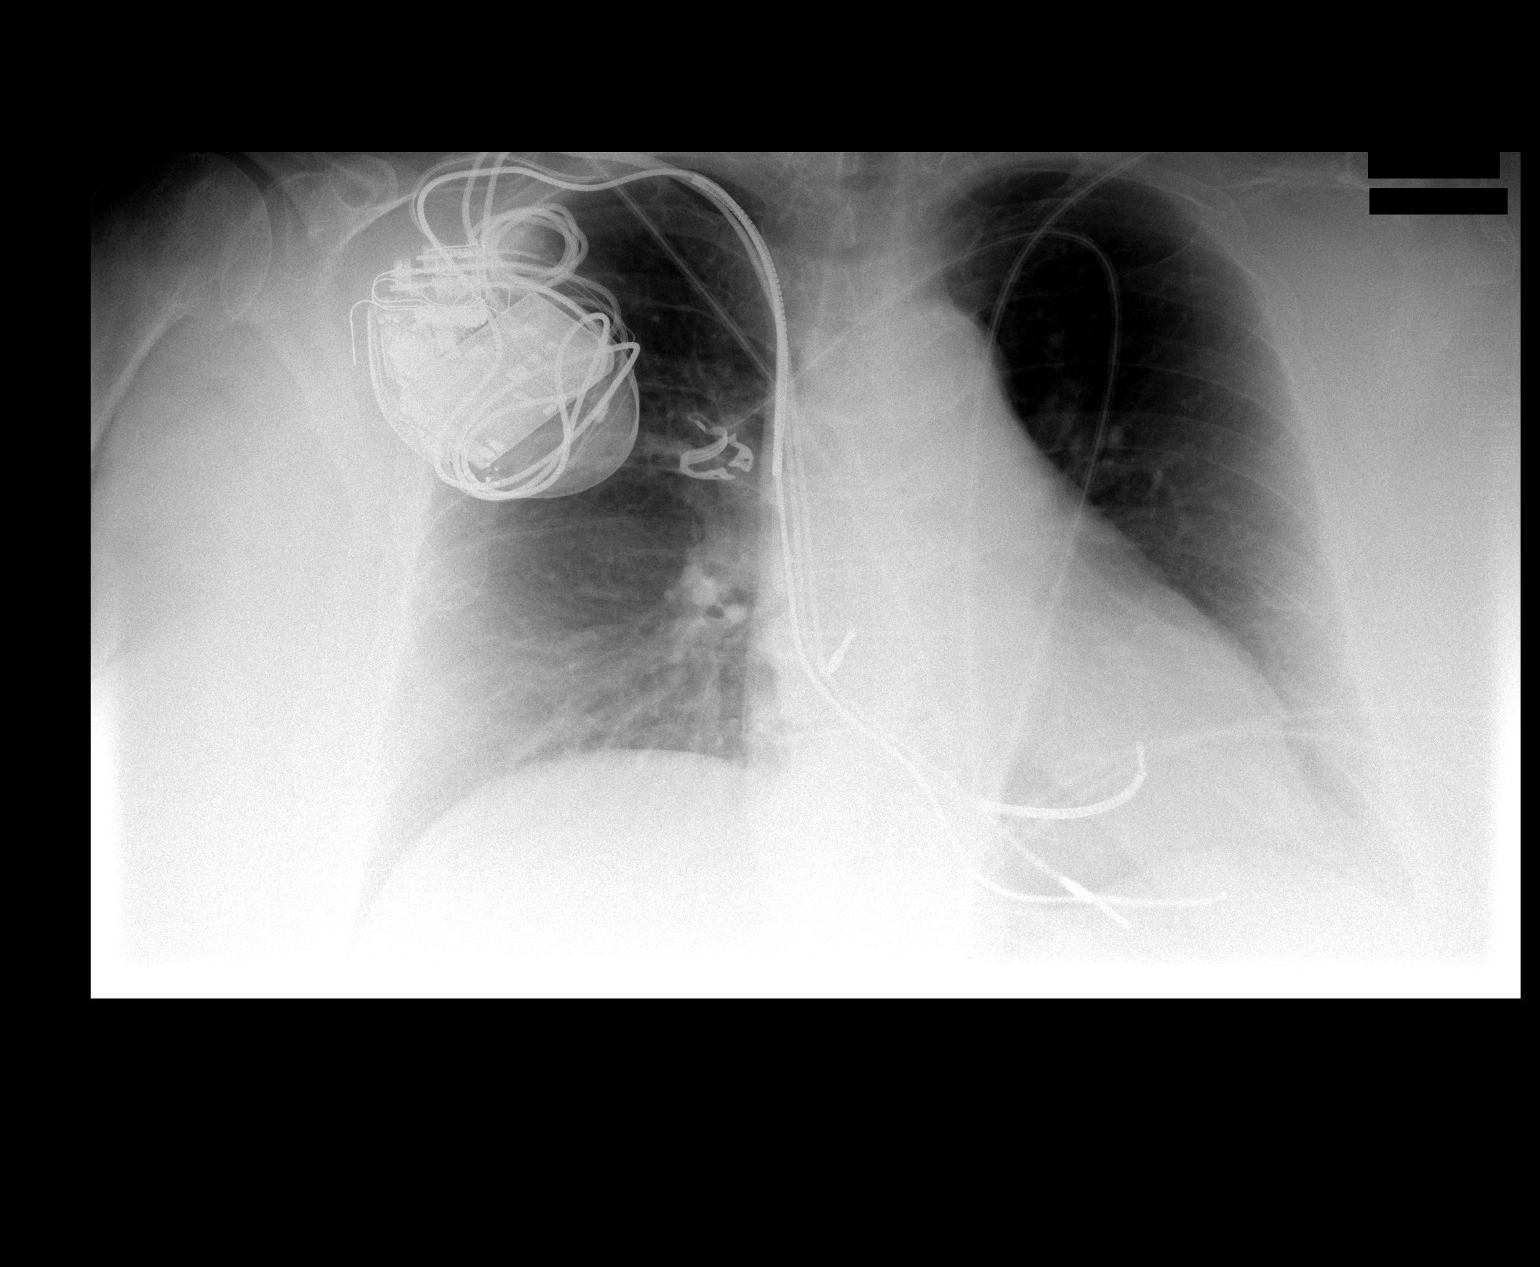

[1 of 1 positions shown; findings below may reference images not displayed]

FINDINGS: The cardiopericardial silhouette is mildly enlarged but
without change.  Lung volumes are low.  No focal airspace disease
is evident.  A pacemaker / AICD is in place.  There are four leads.
The patient is status post cervical spine surgery.  The soft
tissues and bony thorax are otherwise unremarkable.
IMPRESSION: No acute cardiopulmonary disease or significant interval change.

## 2009-02-12 IMAGING — NM NM PULM PERFUSION & VENT (REBREATHING & WASHOUT)
2 series · 12 of 12 positions shown · non-contrast
Comparison: Radiograph examination of 02/16/2008

CLINICAL DATA: Elevated D-dimer.  Shortness of breath.  Chest pain.

NUCLEAR MEDICINE VENTILATION AND PERFUSION SCAN
TECHNIQUE: Wash-in, equilibrium, and wash-out phase ventilation
images were obtained using Se-944 gas.  Perfusion images were
obtained in multiple projections after intravenous injection of Tc-
99m MAA.
Radiopharmaceutical: 9 mCi of xenon 133 gas.  6.6 mCi of technetium
99m - MAA.

[vq scan · 2.52mm/px · 6 of 20 frames shown (1 of 2)]
[frame 2/20  full-range]
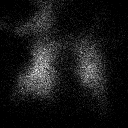
[frame 5/20  full-range]
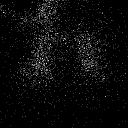
[frame 9/20]
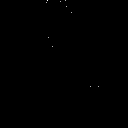
[frame 12/20]
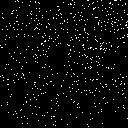
[frame 15/20]
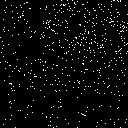
[frame 19/20]
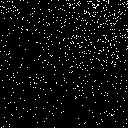

[vq scan · 2.52mm/px · 6 of 20 frames shown (2 of 2)]
[frame 2/20  full-range]
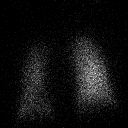
[frame 5/20]
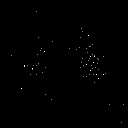
[frame 9/20]
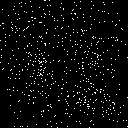
[frame 12/20]
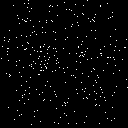
[frame 15/20]
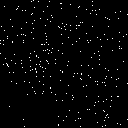
[frame 19/20]
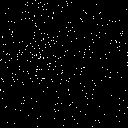

[12 of 12 positions shown; findings below may reference images not displayed]

FINDINGS: Ventilation study:  There is normal distribution of the xenon gas
on breath-holding and equilibrium phases.  No significant air
trapping is seen in the washout phases.

Perfusion study:  There is a normal distribution of the MAA
particles.  No segmental or lobar defects are present.
IMPRESSION: Normal ventilation study.  No perfusion abnormalities identified.
Examination is low probability for pulmonary embolism.

## 2009-02-25 ENCOUNTER — Emergency Department (HOSPITAL_COMMUNITY): Admission: EM | Admit: 2009-02-25 | Discharge: 2009-02-26 | Payer: Self-pay | Admitting: Emergency Medicine

## 2009-03-23 IMAGING — US US ABDOMEN COMPLETE
1 series · 14 of 25 positions shown · non-contrast
Comparison: 12/08/2006

CLINICAL DATA: Abdominal pain.

ABDOMEN ULTRASOUND
TECHNIQUE: Complete abdominal ultrasound examination was performed
including evaluation of the liver, gallbladder, bile ducts,
pancreas, kidneys, spleen, IVC, and abdominal aorta.

[Series 1: us abdomen complete · 0.34mm/px · 14 of 102 slices shown]
[im 1/102]
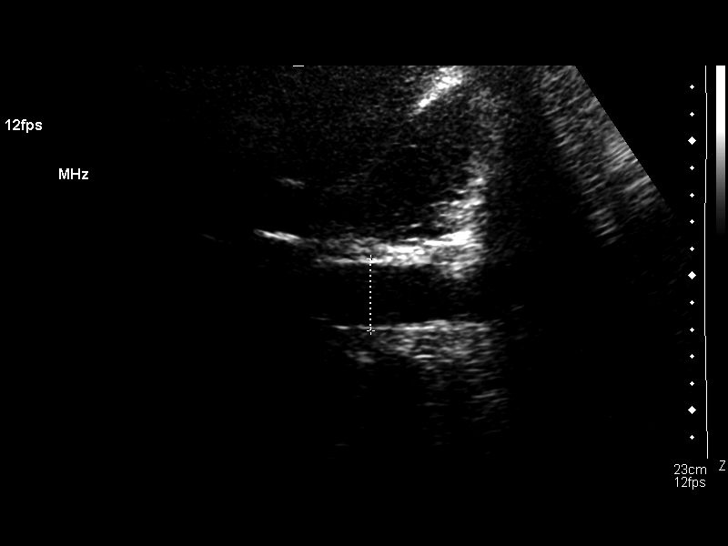
[im 9/102]
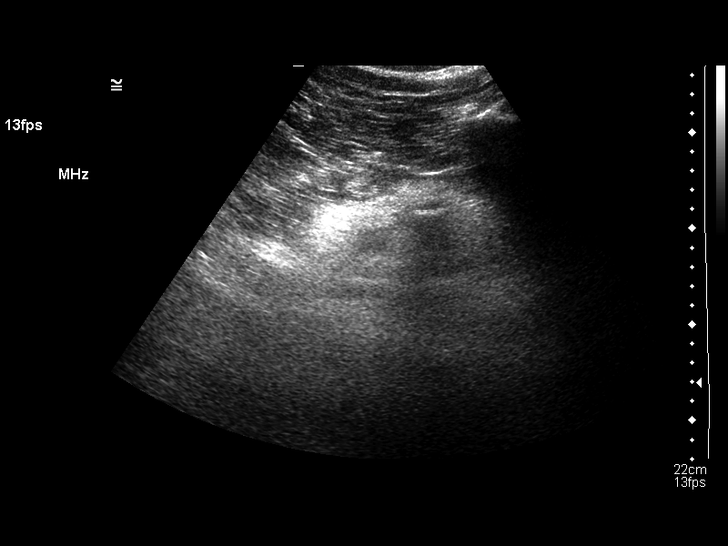
[im 17/102]
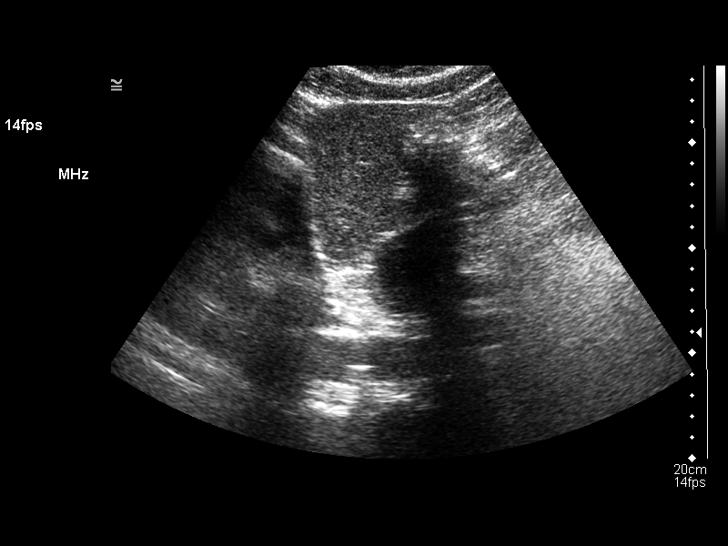
[im 26/102]
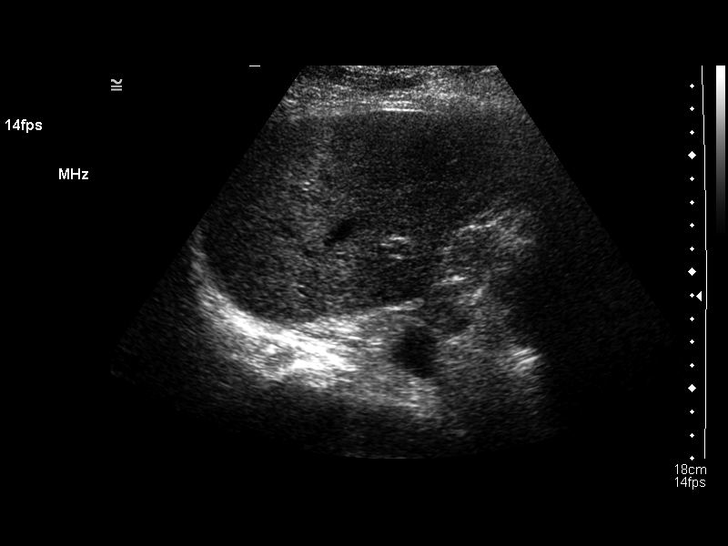
[im 34/102]
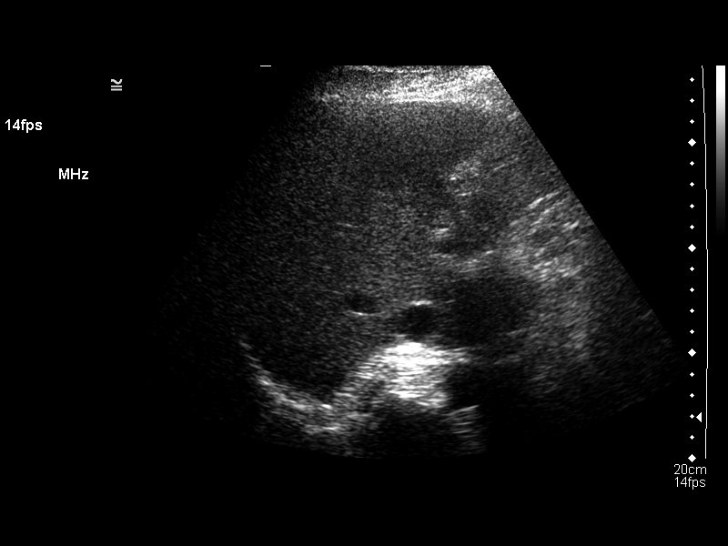
[im 38/102]
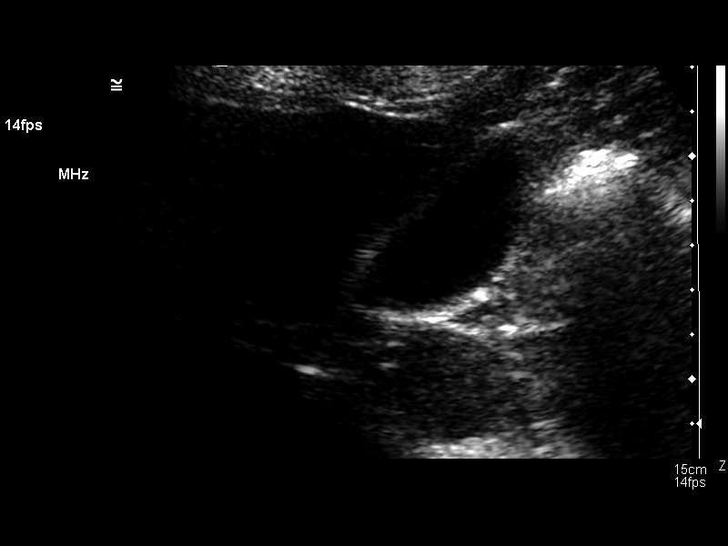
[im 47/102]
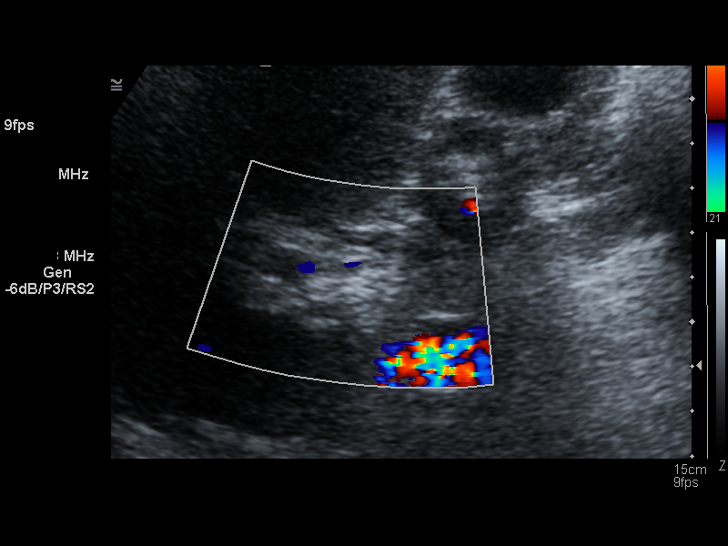
[im 55/102]
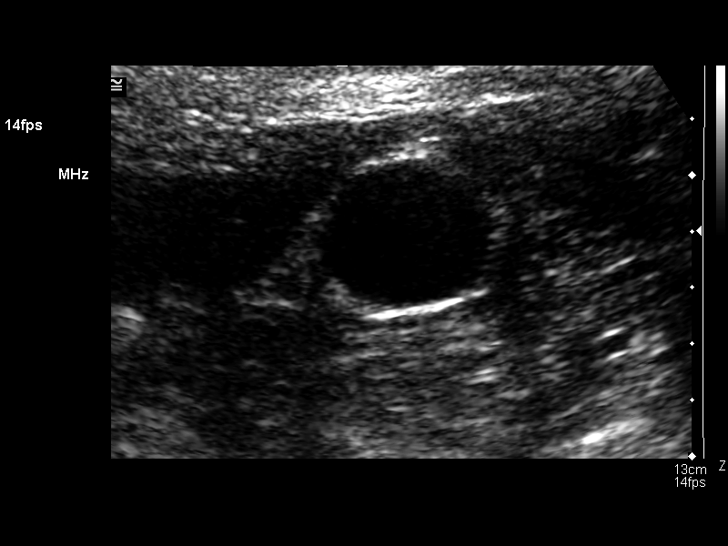
[im 64/102]
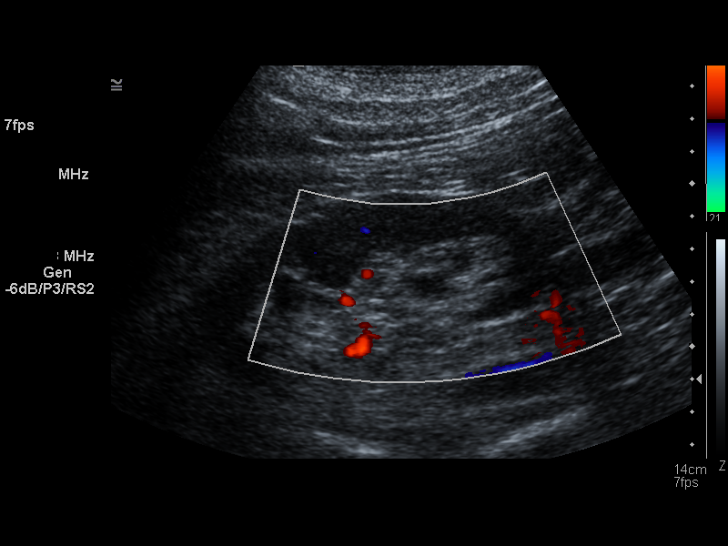
[im 68/102]
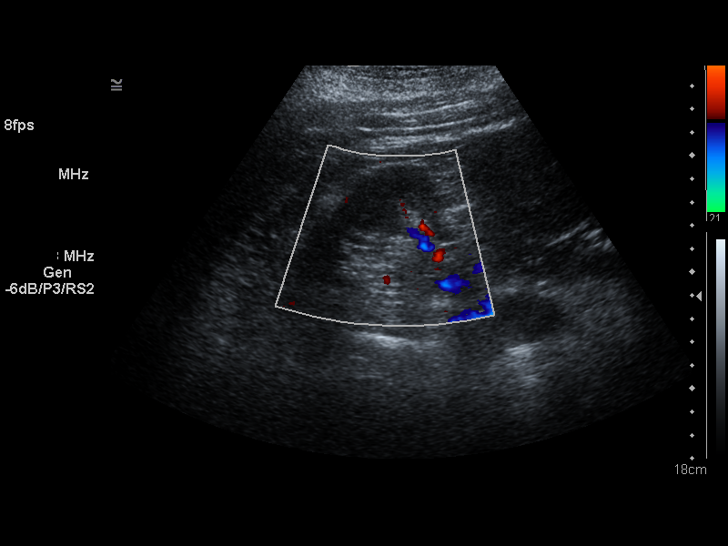
[im 76/102]
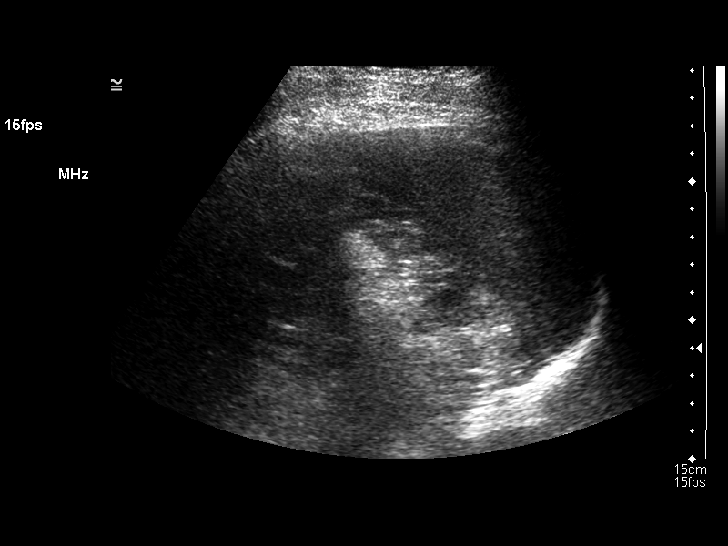
[im 85/102]
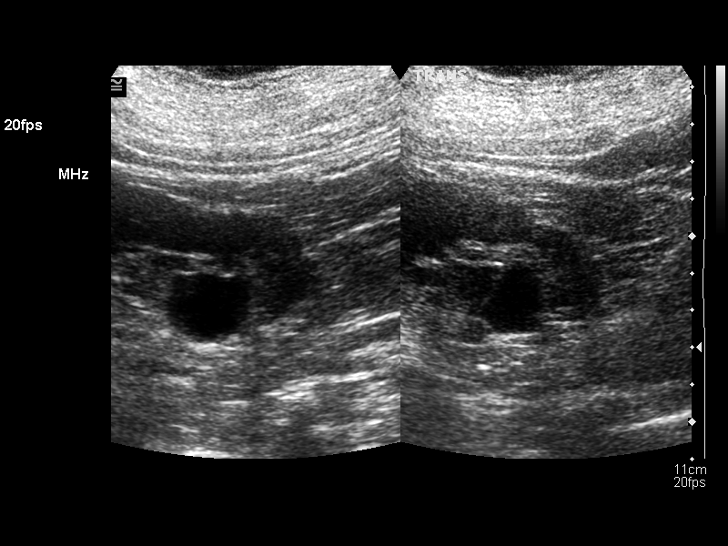
[im 93/102]
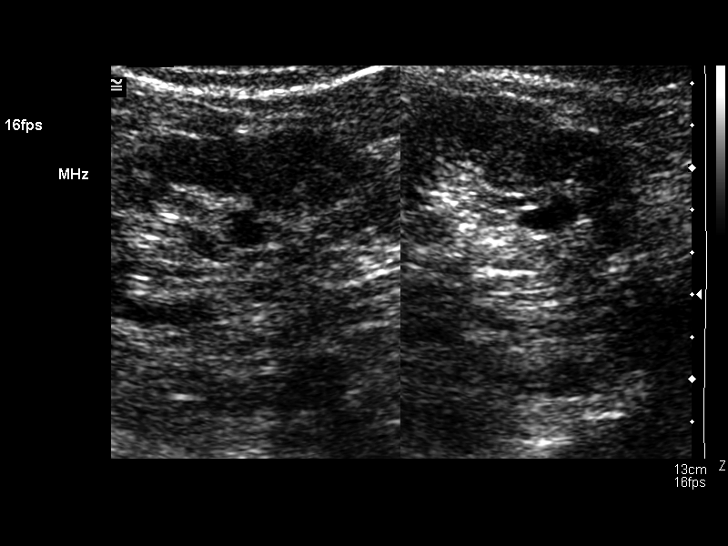
[im 102/102]
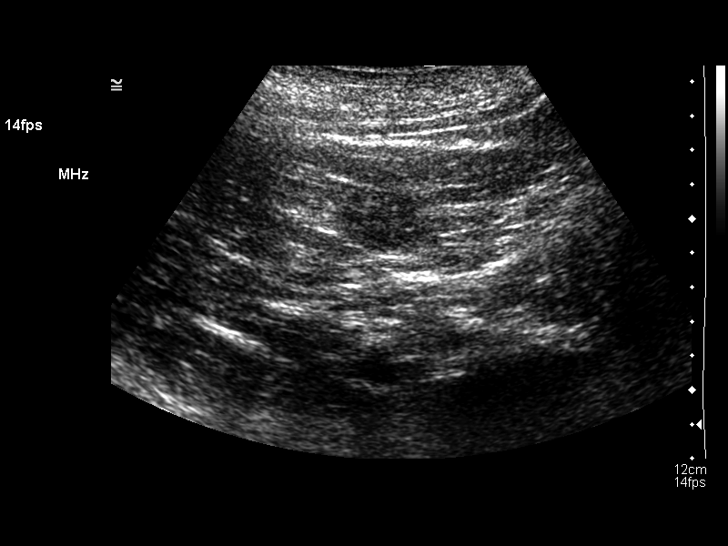

[14 of 25 positions shown; findings below may reference images not displayed]

FINDINGS: Liver is slightly increased in echogenicity.
Gallbladder, extrahepatic bile duct, and visualized portions of the
IVC and pancreas are unremarkable.  Spleen measures 10 cm.  There
are cysts in both kidneys, measuring up to 12 mm on the right and
2.9 cm on the left.  Abdominal aorta is atherosclerotic.
IMPRESSION: 1.  Suspect mild fatty deposition in the liver.
2.  No acute findings.

REF:G3 DICTATED: 03/27/2008 [DATE]

## 2009-04-23 ENCOUNTER — Emergency Department (HOSPITAL_COMMUNITY): Admission: EM | Admit: 2009-04-23 | Discharge: 2009-04-23 | Payer: Self-pay | Admitting: Emergency Medicine

## 2009-04-23 ENCOUNTER — Emergency Department (HOSPITAL_COMMUNITY): Admission: EM | Admit: 2009-04-23 | Discharge: 2009-04-23 | Payer: Self-pay | Admitting: Family Medicine

## 2009-04-23 ENCOUNTER — Encounter (INDEPENDENT_AMBULATORY_CARE_PROVIDER_SITE_OTHER): Payer: Self-pay | Admitting: *Deleted

## 2009-04-30 ENCOUNTER — Encounter: Admission: RE | Admit: 2009-04-30 | Discharge: 2009-04-30 | Payer: Self-pay | Admitting: Family Medicine

## 2009-05-07 ENCOUNTER — Encounter: Admission: RE | Admit: 2009-05-07 | Discharge: 2009-08-05 | Payer: Self-pay | Admitting: Family Medicine

## 2009-05-15 ENCOUNTER — Encounter (INDEPENDENT_AMBULATORY_CARE_PROVIDER_SITE_OTHER): Payer: Self-pay | Admitting: *Deleted

## 2009-05-15 ENCOUNTER — Emergency Department (HOSPITAL_COMMUNITY): Admission: EM | Admit: 2009-05-15 | Discharge: 2009-05-15 | Payer: Self-pay | Admitting: Emergency Medicine

## 2009-08-15 ENCOUNTER — Encounter (INDEPENDENT_AMBULATORY_CARE_PROVIDER_SITE_OTHER): Payer: Self-pay | Admitting: *Deleted

## 2010-02-20 ENCOUNTER — Telehealth: Payer: Self-pay | Admitting: Internal Medicine

## 2010-02-21 ENCOUNTER — Other Ambulatory Visit: Payer: Self-pay | Admitting: Nurse Practitioner

## 2010-02-21 ENCOUNTER — Ambulatory Visit
Admission: RE | Admit: 2010-02-21 | Discharge: 2010-02-21 | Payer: Self-pay | Source: Home / Self Care | Attending: Gastroenterology | Admitting: Gastroenterology

## 2010-02-21 ENCOUNTER — Encounter: Payer: Self-pay | Admitting: Nurse Practitioner

## 2010-02-21 DIAGNOSIS — Z860100 Personal history of colon polyps, unspecified: Secondary | ICD-10-CM | POA: Insufficient documentation

## 2010-02-21 DIAGNOSIS — R110 Nausea: Secondary | ICD-10-CM | POA: Insufficient documentation

## 2010-02-21 DIAGNOSIS — R1084 Generalized abdominal pain: Secondary | ICD-10-CM | POA: Insufficient documentation

## 2010-02-21 DIAGNOSIS — K644 Residual hemorrhoidal skin tags: Secondary | ICD-10-CM

## 2010-02-21 HISTORY — DX: Generalized abdominal pain: R10.84

## 2010-02-21 HISTORY — DX: Personal history of colonic polyps: Z86.010

## 2010-02-21 HISTORY — DX: Residual hemorrhoidal skin tags: K64.4

## 2010-02-21 LAB — COMPREHENSIVE METABOLIC PANEL
ALT: 28 U/L (ref 0–35)
AST: 24 U/L (ref 0–37)
Albumin: 3.5 g/dL (ref 3.5–5.2)
Alkaline Phosphatase: 105 U/L (ref 39–117)
BUN: 22 mg/dL (ref 6–23)
CO2: 30 mEq/L (ref 19–32)
Calcium: 9.2 mg/dL (ref 8.4–10.5)
Chloride: 102 mEq/L (ref 96–112)
Creatinine, Ser: 0.9 mg/dL (ref 0.4–1.2)
GFR: 67.47 mL/min (ref >60.00)
Glucose, Bld: 115 mg/dL — ABNORMAL HIGH (ref 70–99)
Potassium: 4.6 mEq/L (ref 3.5–5.1)
Sodium: 137 mEq/L (ref 135–145)
Total Bilirubin: 0.7 mg/dL (ref 0.3–1.2)
Total Protein: 7 g/dL (ref 6.0–8.3)

## 2010-02-21 LAB — CBC WITH DIFFERENTIAL/PLATELET
Basophils Absolute: 0.1 10*3/uL (ref 0.0–0.1)
Basophils Relative: 0.8 % (ref 0.0–3.0)
Eosinophils Absolute: 0.2 10*3/uL (ref 0.0–0.7)
Eosinophils Relative: 1.9 % (ref 0.0–5.0)
HCT: 41 % (ref 36.0–46.0)
Hemoglobin: 13.4 g/dL (ref 12.0–15.0)
Lymphocytes Relative: 21.6 % (ref 12.0–46.0)
Lymphs Abs: 1.7 10*3/uL (ref 0.7–4.0)
MCHC: 32.7 g/dL (ref 30.0–36.0)
MCV: 78.9 fl (ref 78.0–100.0)
Monocytes Absolute: 0.4 10*3/uL (ref 0.1–1.0)
Monocytes Relative: 5.2 % (ref 3.0–12.0)
Neutro Abs: 5.6 10*3/uL (ref 1.4–7.7)
Neutrophils Relative %: 70.5 % (ref 43.0–77.0)
Platelets: 198 10*3/uL (ref 150.0–400.0)
RBC: 5.2 Mil/uL — ABNORMAL HIGH (ref 3.87–5.11)
RDW: 17.1 % — ABNORMAL HIGH (ref 11.5–14.6)
WBC: 7.9 10*3/uL (ref 4.5–10.5)

## 2010-02-21 LAB — LIPASE: Lipase: 32 U/L (ref 11.0–59.0)

## 2010-02-21 LAB — AMYLASE: Amylase: 33 U/L (ref 27–131)

## 2010-03-03 ENCOUNTER — Encounter: Payer: Self-pay | Admitting: Family Medicine

## 2010-03-14 NOTE — Letter (Signed)
Summary: Out of Work  [facility]  [address] [place], [place] [address]   Phone: [phone]  Fax: [phone]    February 21, 2010   Employee:  ARIENE KLESS    To Whom It May Concern:   For Medical reasons, please excuse the above named employee from work for the following dates:  Start:02-20-2010    End:  02-23-2010   The patient can return to work on Sunday 02-24-2010.   If you need additional information, please feel free to contact our office.         Sincerely,

## 2010-03-14 NOTE — Assessment & Plan Note (Signed)
Summary: abd pain, nausea/regina   History of Present Illness Visit Type: Initial Visit Primary GI MD: Delfin Edis MD Primary Provider: Venida Jarvis, MD Chief Complaint: abdominal pain and nausea pt. c/o feeling very dizzy  History of Present Illness:   Patient is a 65 year old female with an extensive cardiac history and multiple other co-morbidities. She is ln chronic coumadin. Patient knwn to Dr.Brodie for history of GERD, diverticulosis and colon polyps and was last seen in 2008.   [name] presents today for evaluation of several upper gastrointestinal complaints including LUQ pain , bloating and acid reflux. Her LUQ pain initially started as left sided back pain. Patient saw PCP in November for the left mid back pain and was given muscle relaxers without any improvement. Three days ago began having associated upper abdominal pain associated with am nausea. Nexium really helps but hasn't been able to take much of it lately because of the expense. She has tried Protonix but it didn't work as well as Nexium.   She has chronic alternating bowel habits and this hasn't changed.  Some SOB and chest fluttering last night, slightly dizzy today.   Colonoscopy letter sent July 2011 but patient thinks she had one in 2010.      GI Review of Systems    Reports abdominal pain, acid reflux, bloating, chest pain, and  nausea.     Location of  Abdominal pain: upper abdomen.    Denies belching, dysphagia with liquids, dysphagia with solids, heartburn, loss of appetite, vomiting, vomiting blood, weight loss, and  weight gain.      Reports irritable bowel syndrome.     Denies anal fissure, black tarry stools, change in bowel habit, constipation, diarrhea, diverticulosis, fecal incontinence, heme positive stool, hemorrhoids, jaundice, light color stool, liver problems, rectal bleeding, and  rectal pain. Preventive Screening-Counseling & Management  Alcohol-Tobacco     Smoking Status: quit   Drug Use:  no.      Current Medications (verified): 1)  Stool Softener 100 Mg Caps (Docusate Sodium) .... Take As Needed 2)  Symbicort 80-4.5 Mcg/act Aero (Budesonide-Formoterol Fumarate) .... Use As Directed 3)  Spironolactone 25 Mg Tabs (Spironolactone) .... 1/2 Tablet By Mouth Two Times A Day 4)  Furosemide 40 Mg Tabs (Furosemide) .[name].. 1 By Mouth Once Daily 5)  Crestor 20 Mg Tabs (Rosuvastatin Calcium) .[name].. 1 By Mouth Once Daily 6)  Metoprolol Succinate 50 Mg Xr24h-Tab (Metoprolol Succinate) .[name].. 1 1/2 Tablet By Mouth Once Daily 7)  Losartan Potassium 50 Mg Tabs (Losartan Potassium) .[name].. 1 By Mouth Once Daily 8)  Nexium 40 Mg Cpdr (Esomeprazole Magnesium) .[name].. 1 By Mouth Two Times A Day 9)  K-Lor 20 Meq Pack (Potassium Chloride) .[name].. 1 By Mouth Once Daily  Allergies (verified): 1)  ! * Sulfar 2)  ! * Ivp Dye  Past History:  Past Medical History: Current Problems:  ANEMIA (ICD-285.9) CHF (ICD-428.0) CAD (ICD-414.00) HYPERTENSION (ICD-401.9) ARTHRITIS (ICD-716.90) DIVERTICULOSIS, COLON (ICD-562.10) FATTY LIVER DISEASE (ICD-571.8) IMPLANTATION OF DEFIBRILLATOR, HX OF (ICD-V45.02) PACEMAKER, PERMANENT (ICD-V45.01) INTERNAL HEMORRHOIDS WITHOUT MENTION COMP (ICD-455.0) ASTHMA, UNSPECIFIED, UNSPECIFIED STATUS (ICD-493.90) OVERWEIGHT (ICD-278.02) ACUTE GASTRITIS WITHOUT MENTION OF HEMORRHAGE (ICD-535.00) COLONIC POLYPS, HYPERPLASTIC (ICD-211.3) GERD (ICD-530.81) Sleep Apnea  Past Surgical History: Pacemaker: BIV/ICD,for CHB/Ischemic cardiomyopathy, 9/08 Appendectomy  Family History: Reviewed history and no changes required. breast cancer- Family History of Diabetes:  Family History of Heart Disease:  Family History of Kidney Disease:  Social History: Reviewed history and no changes required. Married 5 boys Patient is  a former smoker.  Alcohol Use - no Daily Caffeine Use Illicit Drug Use - no Smoking Status:  quit Drug Use:  no  Review of Systems       The  patient complains of allergy/sinus, back pain, cough, fatigue, itching, shortness of breath, and urine leakage.  The patient denies anemia, anxiety-new, arthritis/joint pain, blood in urine, breast changes/lumps, change in vision, confusion, coughing up blood, depression-new, fainting, fever, headaches-new, hearing problems, heart murmur, heart rhythm changes, menstrual pain, muscle pains/cramps, night sweats, nosebleeds, pregnancy symptoms, skin rash, sleeping problems, sore throat, swelling of feet/legs, swollen lymph glands, thirst - excessive , urination - excessive , urination changes/pain, vision changes, and voice change.    Vital Signs:  Patient profile:   65 year old female Height:      65 inches Weight:      228 pounds BMI:     38.08 Pulse rate:   72 / minute Pulse rhythm:   regular BP sitting:   186 / 110  (left arm)  Vitals Entered By: Randye Lobo NCMA (February 21, 2010 10:30 AM)  Physical Exam  General:  Obese white female Head:  Normocephalic and atraumatic. Eyes:  Conjunctiva pink, no icterus.  Neck:  no obvious masses  Lungs:  Clear throughout to auscultation. Heart:  Regular rate and rhythm; no murmurs, rubs,  or bruits. Abdomen:  Obese, upper abdominal fullness, NT, no discrete masses, postive bowel sounds. Rectal:  External hemorrhoids Msk:  Symmetrical with no gross deformities. Normal posture. Extremities:  No palmar erythema, no edema.  Neurologic:  Alert and  oriented x4;  grossly normal neurologically. Skin:  Intact without significant lesions or rashes. Cervical Nodes:  No significant cervical adenopathy. Psych:  Alert and cooperative. Normal mood and affect.   Impression & Recommendations:  Problem # 1:  GERD (ICD-530.81) Assessment Deteriorated Patient has typical GERD symptoms but also with dyspeptic type symptoms as well. Hasn't been taking Nexium on regular basis secondary to costs. Will try Omeprazole and increase to twice daily, hopefully this  will get her back on track.  Will check some basic labs today. She will call us next week with condition update. May need EGD if no improvment but she is high risk given her extensive cardiac problems.   Problem # 2:  PERSONAL HX COLONIC POLYPS (ICD-V12.72) Assessment: Comment Only She received colonoscopy recall letter from [place] July 2011. Will discuss with Dr. Olevia Perches and let her weigh the benefits / risks of procedure given the patient's extensive medical problems.  If we do proceed with colonoscopy then perhaps her EGD could be done simultaneously (assuming her upper GI symptoms don't improve on PPI).  Problem # 3:  HEMORRHOIDS-EXTERNAL (ICD-455.3) Assessment: Deteriorated Trial of steroid suppositories.  Problem # 4:  CAD (ICD-414.00) Assessment: Comment Only Significant LV dysfunction.  Problem # 5:  HYPERTENSION (ICD-401.9) Assessment: Deteriorated BP 186/110,  Her BP was rechecked three times in the office with DBP being 108-110,  She complains of dizziness. We called her cardiologist Dr. Lovena Le and patient will be seen at his office today. Patient has a driver with her.   Problem # 6:  IMPLANTATION OF DEFIBRILLATOR, HX OF (ICD-V45.02) Assessment: Comment Only  Other Orders: TLB-CBC Platelet - w/Differential (85025-CBCD) TLB-CMP (Comprehensive Metabolic Pnl) (0000000) TLB-Amylase (82150-AMYL) TLB-Lipase (83690-LIPASE)  Patient Instructions: 1)  Please go to lab, basement level. 2)  We have given you a work note.  3)  We sent perscriptions for Omeprazole and Hydrocortisone suppositories. 4)  Take the  Nexium samples we have given twice daily.  5)  Make appt when leaving today to see Dr. Olevia Perches in 3-4 weeks. 6)  We made you an appointment to see Dr. Rollene Fare today the 12th at 3:15 PM.  7)  Copy sent to : Dr. Venida Jarvis Prescriptions: HYDROCORTISONE ACETATE 25 MG SUPP (HYDROCORTISONE ACETATE) Take 1 suppository at bedtime x 7 days  #7 x 1   Entered by:   Marisue Humble NCMA    Authorized by:   Tye Savoy NP   Signed by:   Marisue Humble NCMA on 02/21/2010   Method used:   Electronically to        [facility]. [address] [phone]* (retail)       [address], [place]  [address]       Ph: [phone] or [phone]       Fax: [phone]   RxID:   [id number] OMEPRAZOLE 40 MG CPDR (OMEPRAZOLE) Take 1 tab twice daily  #60 x 1   Entered by:   Marisue Humble NCMA   Authorized by:   Tye Savoy NP   Signed by:   Marisue Humble NCMA on 02/21/2010   Method used:   Electronically to        [facility]. [address] [phone]* (retail)       [address]       [place], [place]  [address]       Ph: [phone] or [phone]       Fax: [phone]   RxID:   [id number]   Appended Document: abd pain, nausea/regina Sydell Axon, please advise on whether you want to pursue colonoscopy given patient's multiple medical problems. Thanks  Appended Document: abd pain, nausea/regina I cannot decide without seeing the pt. I also don't see  aPath report on "multiple polyps" from 2004 colonoscopy. She may need a colonoscopy but I don't have a good feel for her. Perhaps she needs to see me.  Appended Document: abd pain, nausea/regina Path report will be scanned in but polyps at 18cm and 30cm were hyperplastic.  She will be seeing you in a few weeks and you can decide on feasibility of surveillance colonoscopy

## 2010-03-14 NOTE — Progress Notes (Signed)
Summary: Triage / multiple complaints  Phone Note Call from Patient Call back at Home Phone [phone] Call back at [phone]   Caller: Patient Call For: Dr. Olevia Perches Reason for Call: Talk to Nurse Summary of Call: Pain in abdomen and up into her side and back, pt feels sick to her stomach Initial call taken by: Martinique Corrie,  February 20, 2010 10:08 AM  Follow-up for Phone Call        Tried calling the number given for call back. It is not a working number. Tried to call the home number and it was not the patient's home. Spoke with Martinique and she has the same numbers I have. Follow-up by: Leone Payor RN,  February 20, 2010 11:22 AM  Additional Follow-up for Phone Call Additional follow up Details #1::        Martinique found a new number [phone] but patient will not be there until later today. Will try her this afternoon. Leone Payor RN  February 20, 2010 11:48 AM    Additional Follow-up for Phone Call Additional follow up Details #2::    Spoke with patient. A couple months ago, she started having pain in her back and she saw Dr. Inda Merlin. Now has pain in stomach and back. Last week stomach pain began. Pain is in the center of chest under her breasts that radiates to left side. Feels like something is "hung" on the left side of back. She reports she gets choked easily. Nausea in the AM when she gets up and after eating. Bloating in AM also. Denies vomiting, diarrhea  or blood in stools. States she is under a lot of stress with her husband being sick. Patient reports feeling faint this AM. She is on Losartan 50 mg  daily, Spionolactone and Metoprolol. She will call Dr. Inda Merlin to report the dizziness. Takes Nexium 40 mg daily. Patient will increase this to twice/daily.Patients last EGD 11/38-mild antral gastritis, no stimata of bleeding. She was due for colonoscopy on 7/11. Scheduled patient with Dr. Olevia Perches on 02/22/10 at 3:45 PM. please, advise Follow-up by: Leone Payor RN,  February 20, 2010  2:37 PM  Additional Follow-up for Phone Call Additional follow up Details #3:: Details for Additional Follow-up Action Taken: Pt needs to change her appt with Dr. Olevia Perches because it conflicts wtih her husbands appt, she can be reached at home  Additional Follow-up by: Martinique Coutts,  February 20, 2010 2:50 PM   Spoke with patient and r/s'ed visit to 02/21/10 at 10:30 AM with Tye Savoy, NP. Leone Payor, RN 02/20/10 3:30 PM

## 2010-03-14 NOTE — Letter (Signed)
Summary: Colonoscopy Letter  [name]  [name], [name]   Phone: [phone]  Fax: [phone]      August 15, 2009 MRN: [id number]   [name] [id number] [place], [place]  [address]   Dear [name],   According to your medical record, it is time for you to schedule a Colonoscopy. The American Cancer Society recommends this procedure as a method to detect early colon cancer. Patients with a family history of colon cancer, or a personal history of colon polyps or inflammatory bowel disease are at increased risk.  This letter has beeen generated based on the recommendations made at the time of your procedure. If you feel that in your particular situation this may no longer apply, please contact our office.  Please call our office at [phone] to schedule this appointment or to update your records at your earliest convenience.  Thank you for cooperating with [place] to provide you with the very best care possible.   Sincerely,  Lowella Bandy. Olevia Perches, M.D.  [facility] Gastroenterology Division [phone]

## 2010-03-14 NOTE — Procedures (Addendum)
Summary: Gastroenterology EGD  Gastroenterology EGD   Imported By: Randye Lobo CMA 04/16/2007 15:27:45  _____________________________________________________________________  External Attachment:    Type:   Image     Comment:   External Document  Appended Document: Gastroenterology EGD confirmed through [facility] system that patient [name], dob 07/24/1945 had this procedure completed on 12/14/06. EGD report entered under wrong date of birth ([birth year]).

## 2010-03-14 NOTE — Procedures (Addendum)
Summary: Gastroenterology Colonoscopy  Gastroenterology Colonoscopy   Imported By: Randye Lobo CMA 04/16/2007 15:26:55  _____________________________________________________________________  External Attachment:    Type:   Image     Comment:   External Document  Appended Document: Gastroenterology Colonoscopy confirmed through [facility] system that patient [name], dob 10/01/1945 had this procedure completed on 09/27/02. EGD report entered under wrong date of birth ([birth year]).

## 2010-04-08 ENCOUNTER — Ambulatory Visit: Payer: Self-pay | Admitting: Internal Medicine

## 2010-04-18 NOTE — Procedures (Signed)
Summary: EGD   EGD  Procedure date:  09/27/2002  Findings:      Location: [place]   Patient Name: Kathye, Gehlbach MRN:  Procedure Procedures: Panendoscopy (EGD) CPT: A5739879.    with biopsy(s)/brushing(s). CPT: T4586919.  Personnel: Endoscopist: Dellas Guard L. Olevia Perches, MD.  Referred By: Terance Ice, MD.  Exam Location: Exam performed in Outpatient Clinic. Outpatient  Patient Consent: Procedure, Alternatives, Risks and Benefits discussed, consent obtained, from patient. Consent was obtained by the RN.  Indications Symptoms: Dysphagia. Chest Pain.  History Allergies: No known allergies.  Patient Habits Patient does not smoke. Drinking Status: not currently drinking.  Pre-Exam Physical: Performed Sep 27, 2002  Cardio-pulmonary exam, HEENT exam, Abdominal exam, Extremity exam, Neurological exam, Mental status exam WNL.  Exam Exam Info: Maximum depth of insertion Duodenum, intended Duodenum. Vocal cords visualized. Gastric retroflexion performed. Images taken. ASA Classification: II. Tolerance: good.  Sedation Meds: Patient assessed and found to be appropriate for moderate (conscious) sedation. Fentanyl 50 mcg. given IV. Versed 5 mg. given IV. Cetacaine Spray 2 sprays given aerosolized.  Monitoring: BP and pulse monitoring done. Oximetry used. Supplemental O2 given  Findings Dilation: Distal Esophagus. Maloney dilator used, Diameter: 48 mm, Minimal Resistance, No Heme present on extraction. 1  total dilators used. Patient tolerance excellent. Outcome: successful.  DIAGNOSTIC TEST: from [facility]tr[facility]. RUT done, results pending Reason: r/o H. Pylori.   Assessment Normal examination.  Comments: no  obvious stricture, s/p passage of 19F dilator Comments: chest pain likely due to chronic gerd Events  Unplanned Intervention: No unplanned interventions were required.  Unplanned Events: There were no complications. Plans Medication(s): Await pathology. PPI:  Pantoprazole/Protonix 40 mg QD, starting Sep 27, 2002   Patient Education: Patient given standard instructions for: Reflux.  Comments: MAY INCREASE pROTONIX TO BID DOSE as needed Disposition: After procedure patient sent to recovery. After recovery patient sent home.   This report was created from the original endoscopy report, which was reviewed and signed by the above listed endoscopist.    Appended Document: EGD confirmed through [facility] system that patient jedidah albach, dob 01/21/1946 had this procedure completed on 09/27/02. EGD report entered under wrong date of birth ([birth year]).

## 2010-04-19 IMAGING — US US ABDOMEN COMPLETE
1 series · 14 of 25 positions shown · non-contrast
Comparison: 03/27/2008

CLINICAL DATA: Abdominal pain and flank pain.

COMPLETE ABDOMINAL ULTRASOUND

[Series 1: us abdomen complete · 0.28mm/px · 14 of 67 slices shown]
[im 1/67]
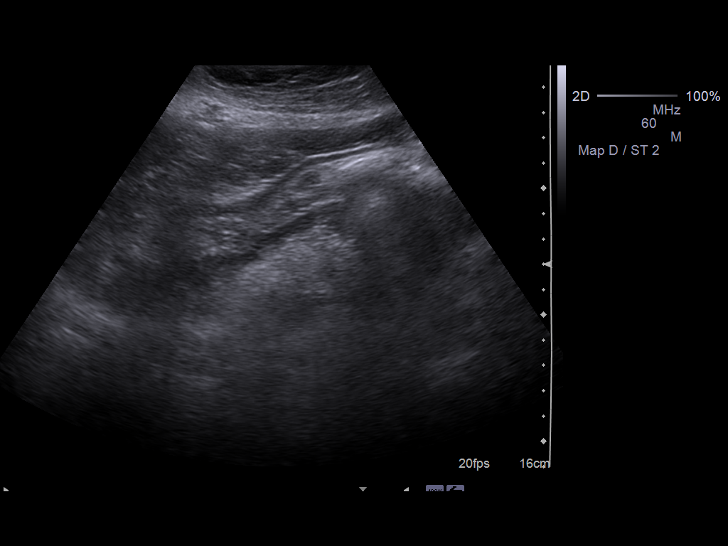
[im 6/67]
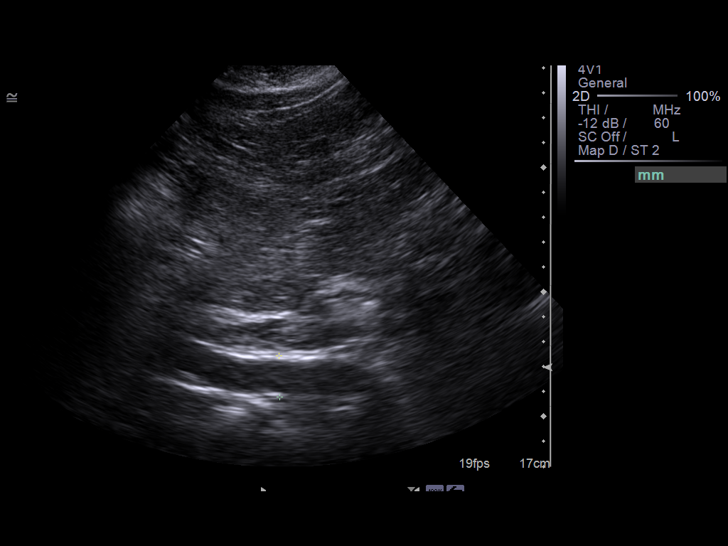
[im 12/67]
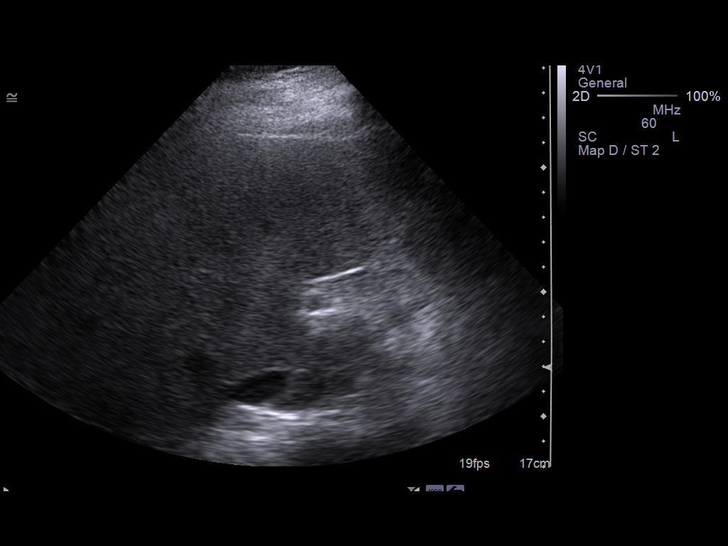
[im 17/67]
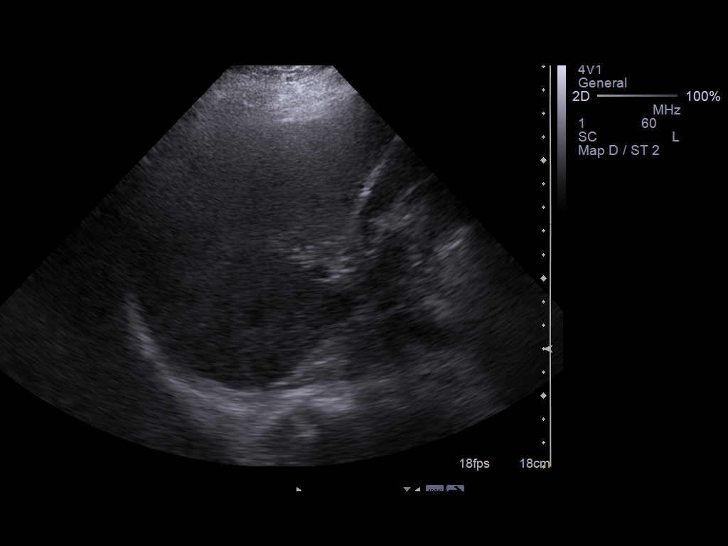
[im 23/67]
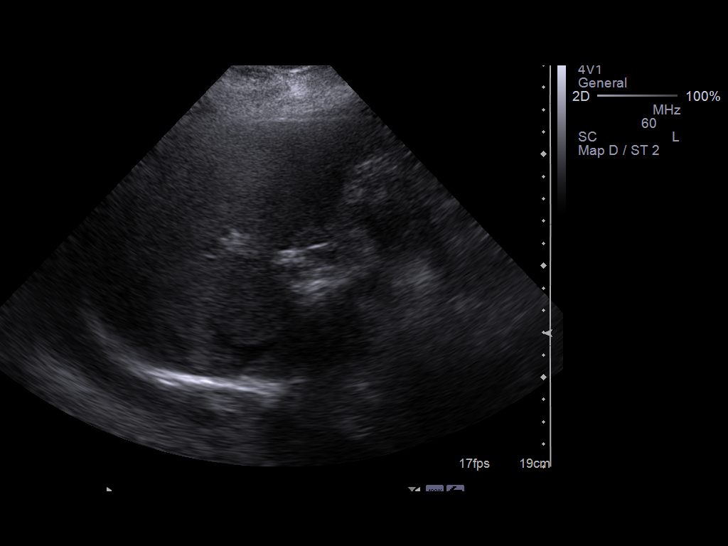
[im 25/67]
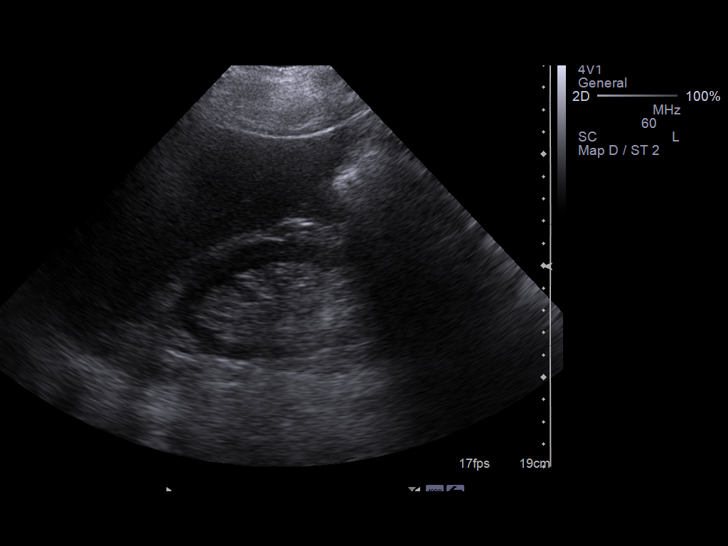
[im 31/67]
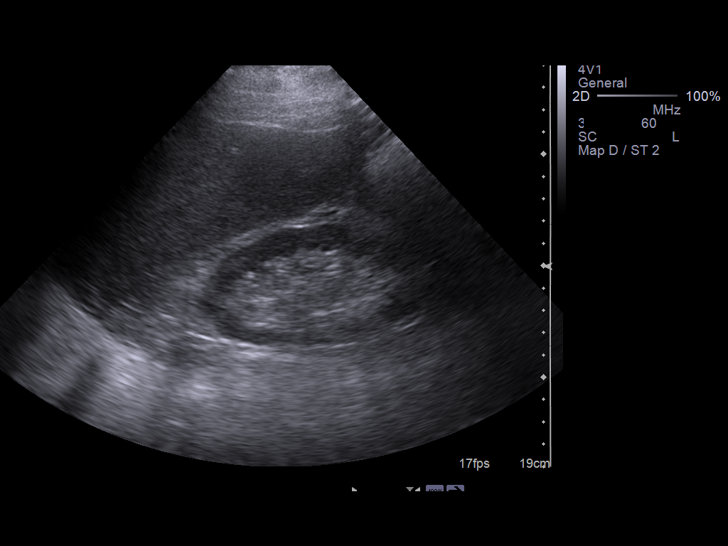
[im 36/67]
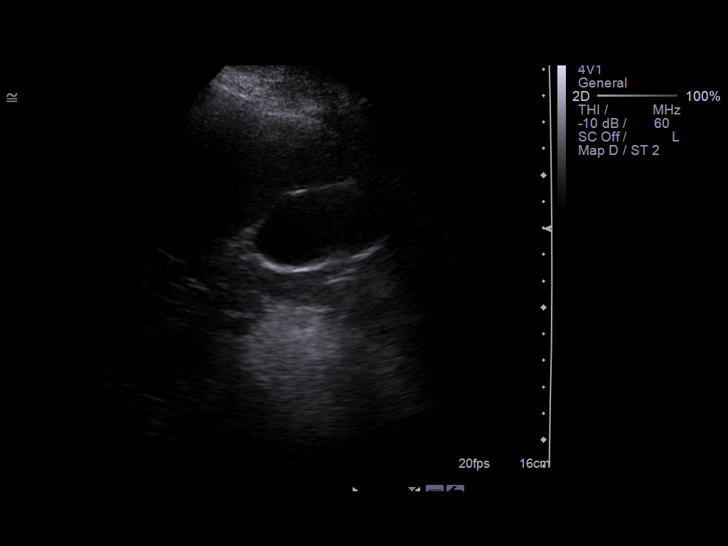
[im 42/67]
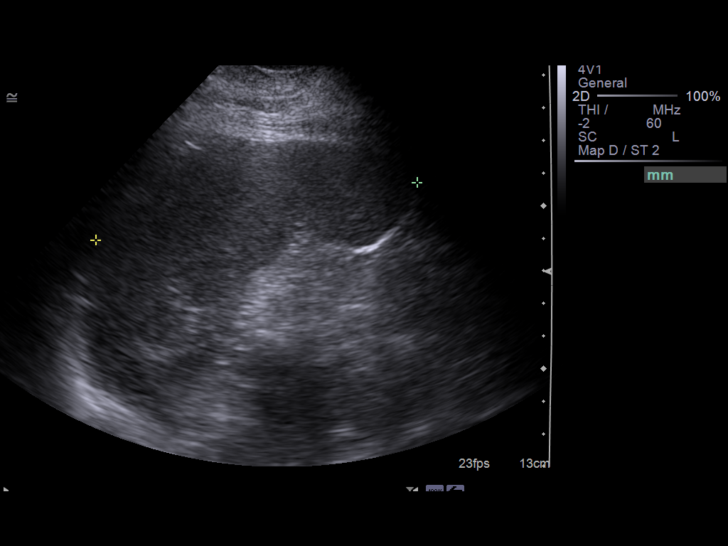
[im 45/67]
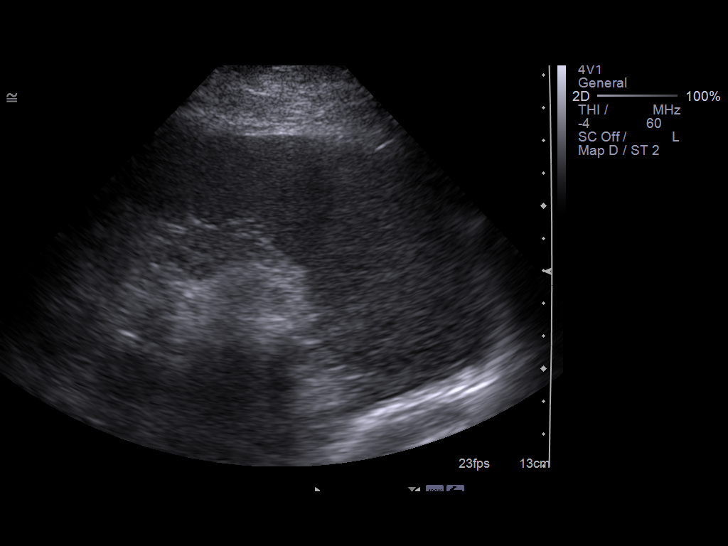
[im 50/67]
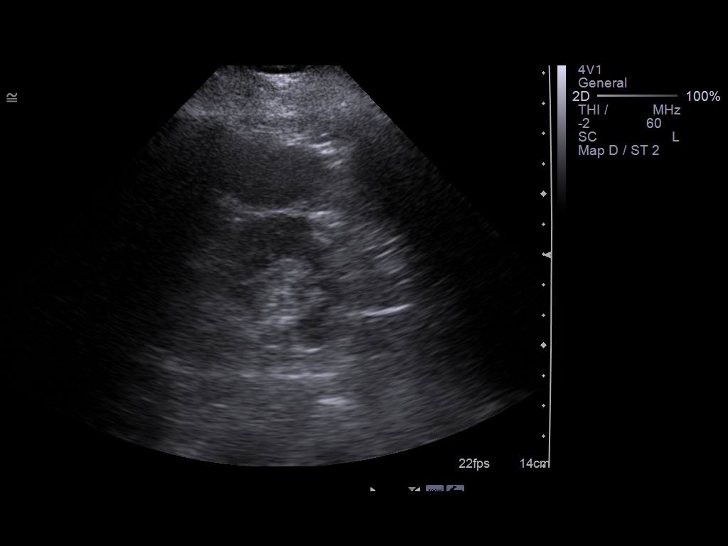
[im 56/67]
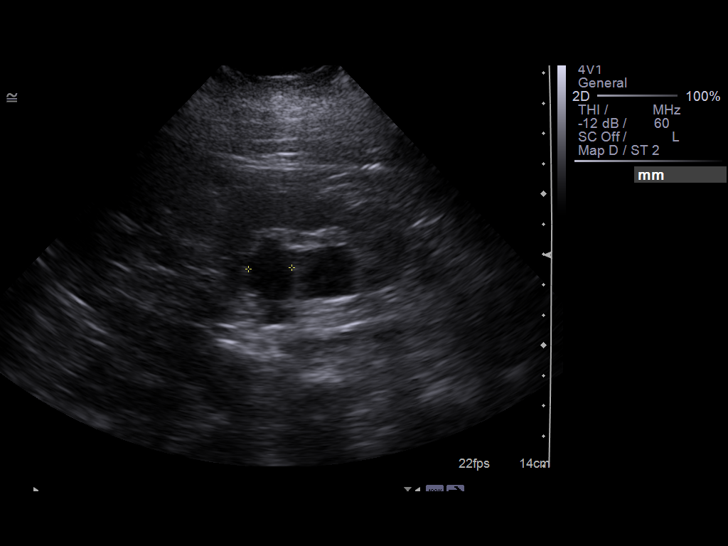
[im 61/67]
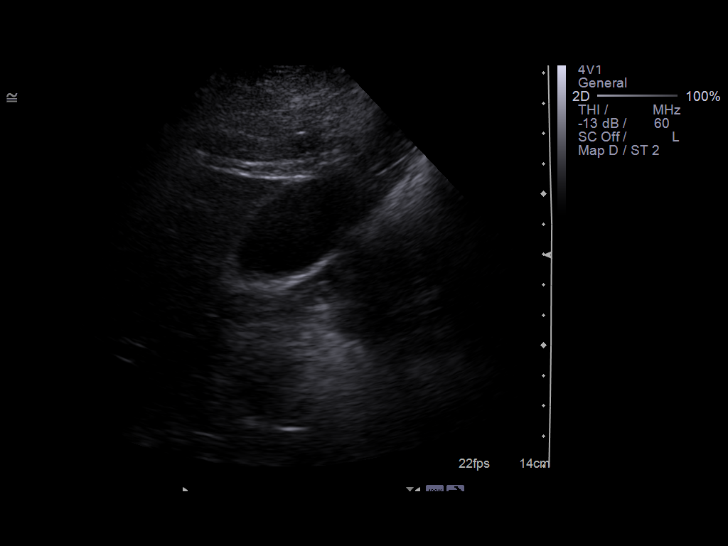
[im 67/67]
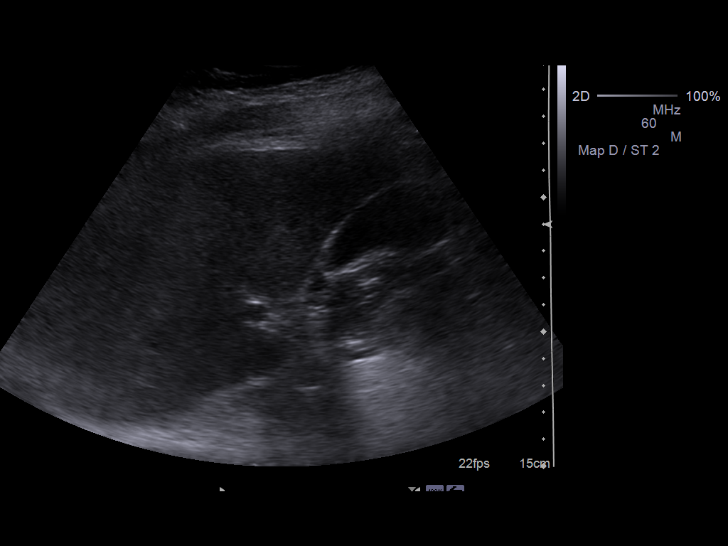

[14 of 25 positions shown; findings below may reference images not displayed]

FINDINGS: Gallbladder:  Negative.

Common bile duct:  Measures 3 mm, within normal limits.

Liver:  Diffusely increased in echotexture.

IVC:  Visualized.

Pancreas:  Visualization is limited by bowel gas.

Spleen:  Measures 10.0 cm, negative.

Right Kidney:  Measures measures 11.1 cm, negative.

Left Kidney:  Measures 11.3 cm.  Contains anechoic lesions with
increased through transmission, measuring up to 1.9 x 1.8 x 2.0 cm,
consistent with cysts.

Abdominal aorta:  Visualization is somewhat limited by bowel gas.
IMPRESSION: Fatty liver.  No acute findings.

## 2010-04-19 IMAGING — CT CT ABD-PELV W/O CM
2 of 4 series · 17 of 46 positions shown, 19 images · non-contrast
Comparison: Ultrasound same day

CLINICAL DATA: Abdominal pain.  Recent fall.  Previous
appendectomy.

CT ABDOMEN AND PELVIS WITHOUT CONTRAST
TECHNIQUE: Multidetector CT imaging of the abdomen and pelvis was
performed following the standard protocol without intravenous
contrast.

[Series 3: abd/pelv w/o 5.0 b31f st · axial · non-contrast · 0.77mm/px · z∈[-432,-28]mm · 14 of 89 slices shown, 16 images]
[im 4/89  soft-tissue]
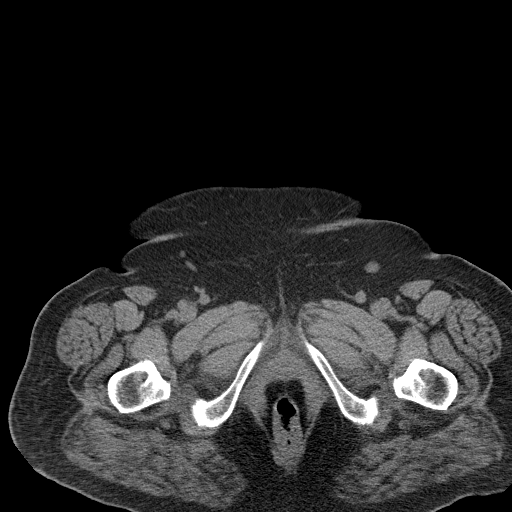
[im 4/89  bone]
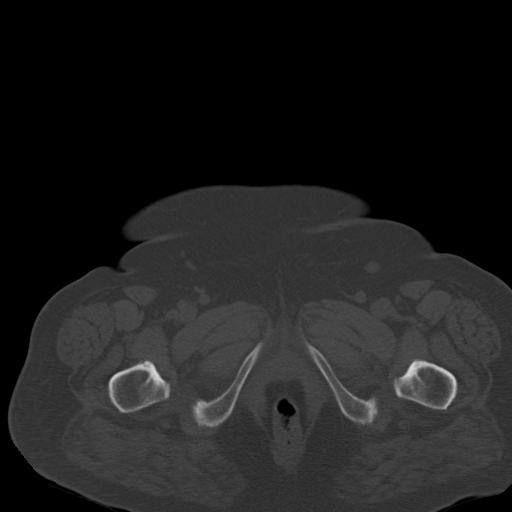
[im 12/89  soft-tissue]
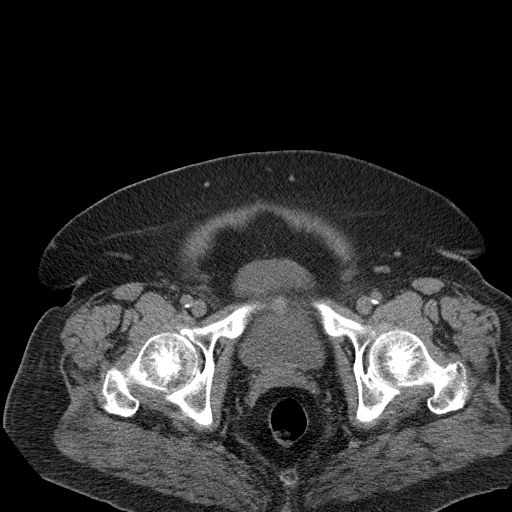
[im 19/89  soft-tissue]
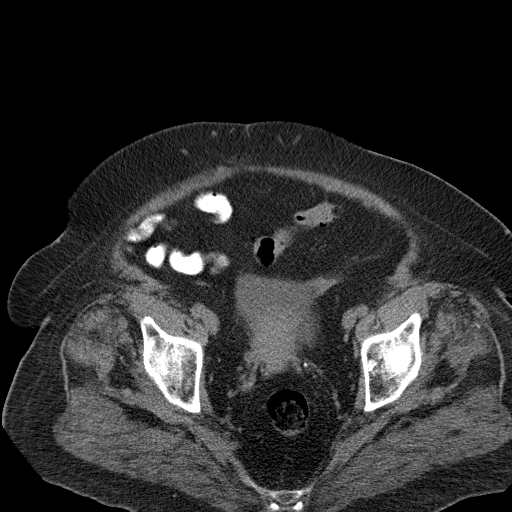
[im 23/89  soft-tissue]
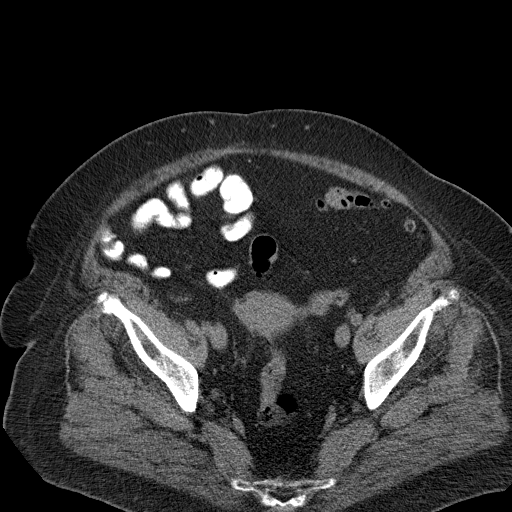
[im 30/89  soft-tissue]
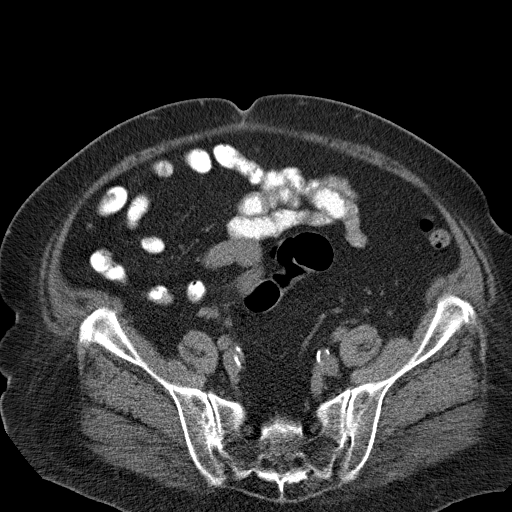
[im 37/89  soft-tissue]
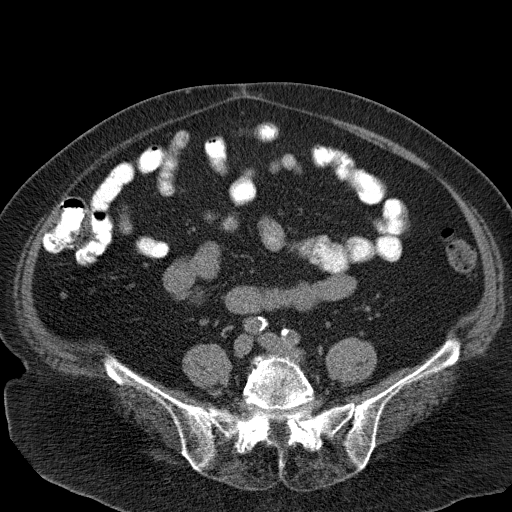
[im 41/89  soft-tissue]
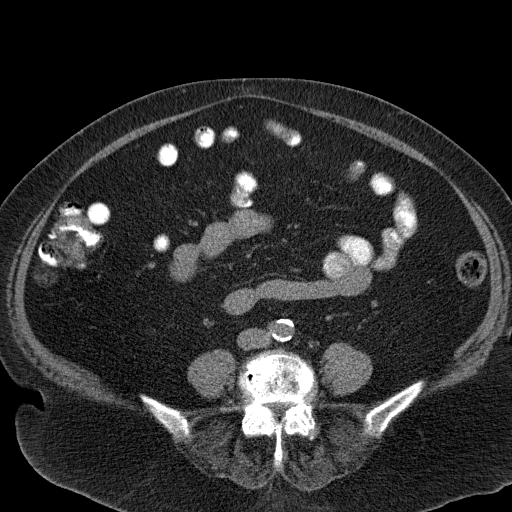
[im 48/89  soft-tissue]
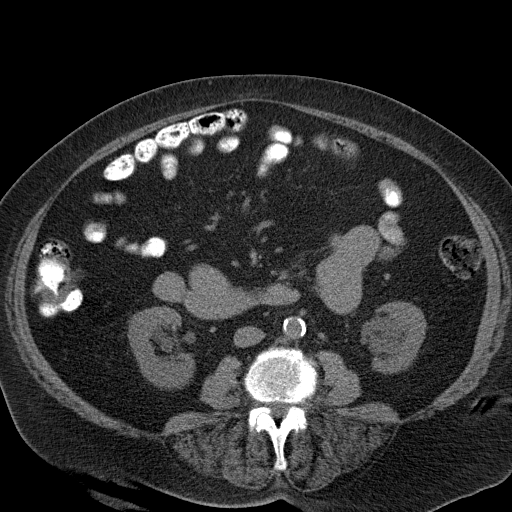
[im 52/89  soft-tissue]
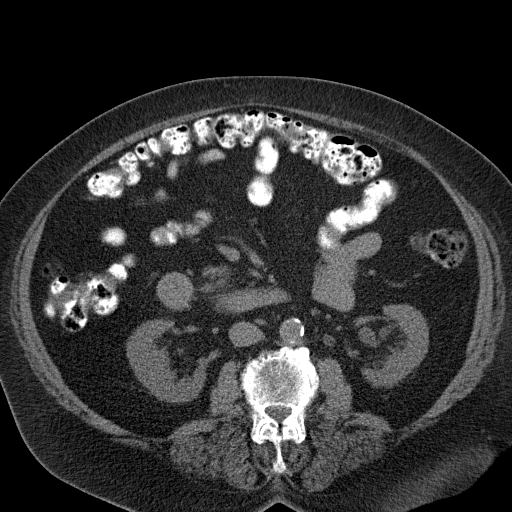
[im 52/89  bone]
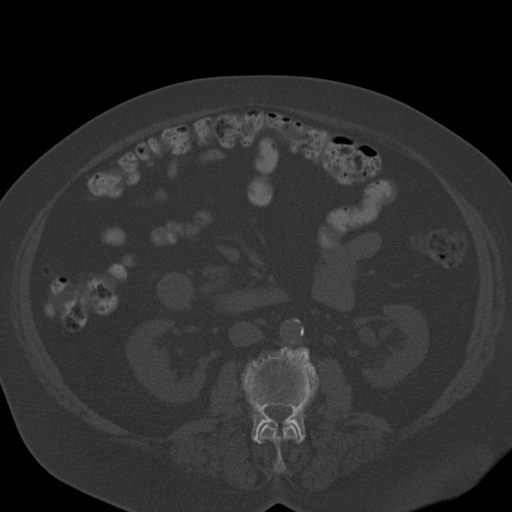
[im 59/89  soft-tissue]
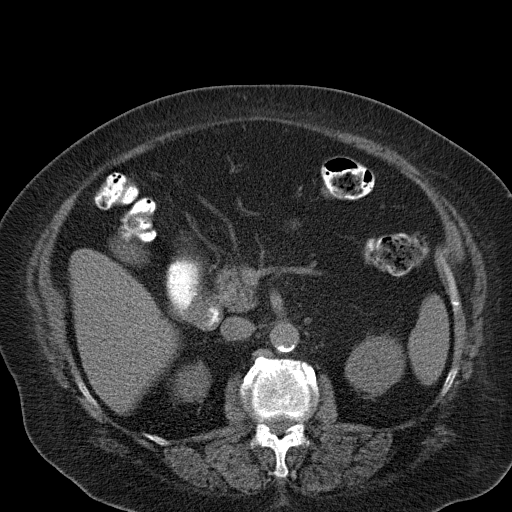
[im 67/89  soft-tissue]
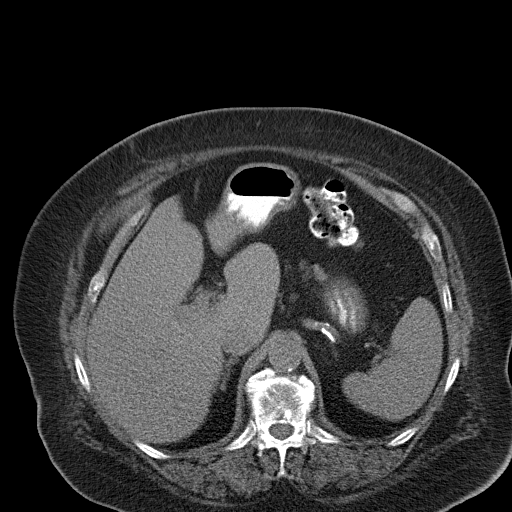
[im 70/89  soft-tissue]
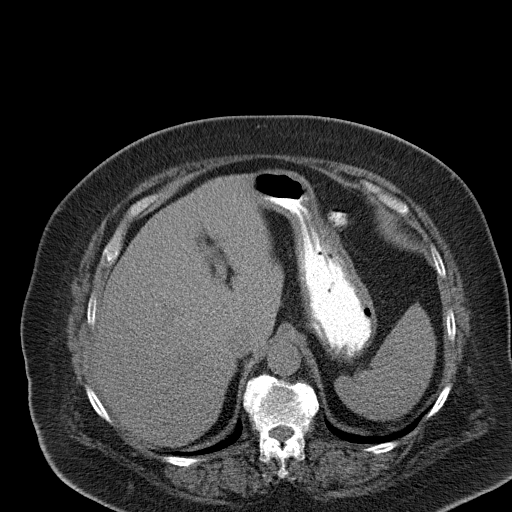
[im 78/89  soft-tissue]
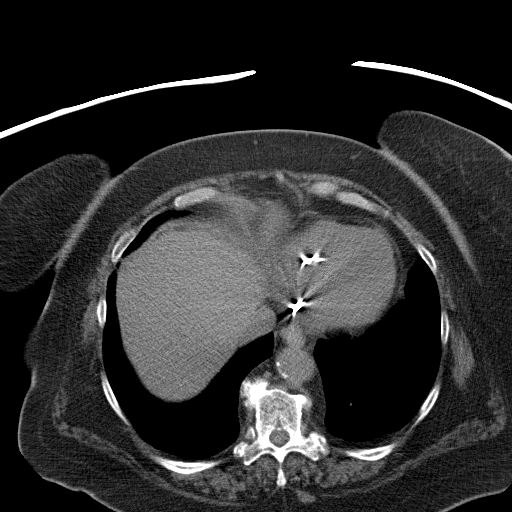
[im 85/89  soft-tissue]
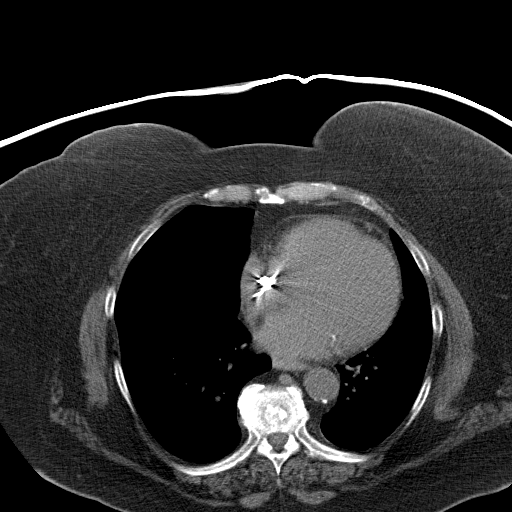

[Series 602: coronal · coronal · 0.87mm/px · 3 of 101 slices shown]
[im 34/101  soft-tissue]
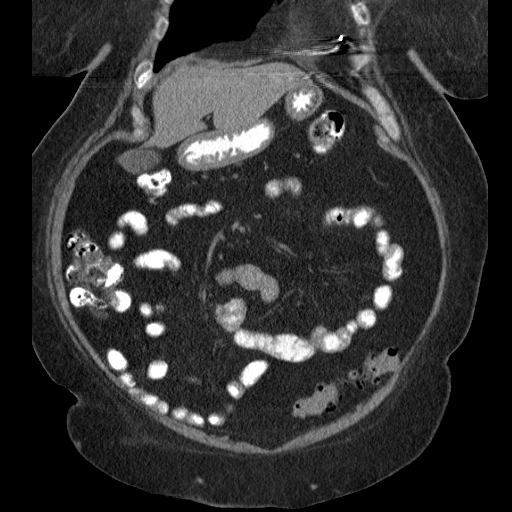
[im 45/101  soft-tissue]
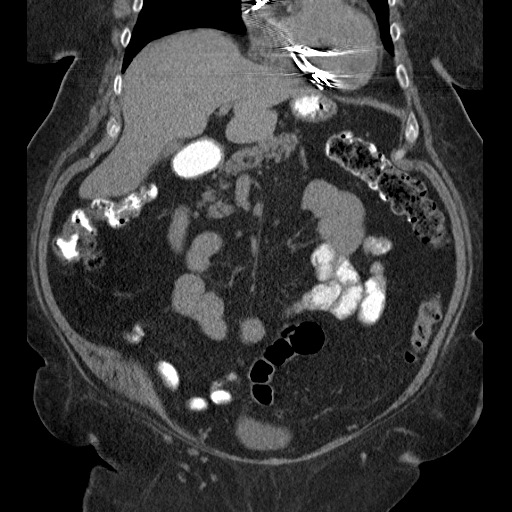
[im 56/101  soft-tissue]
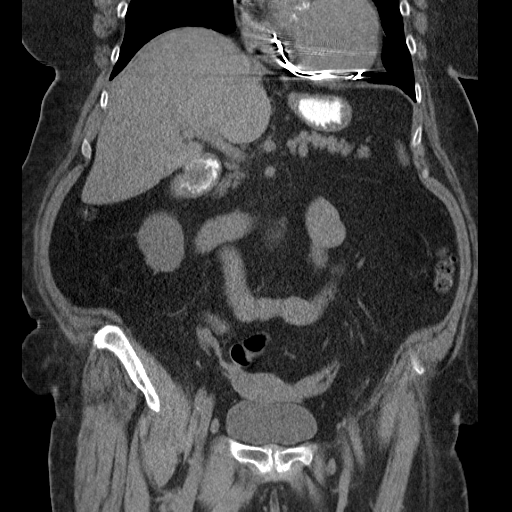

[17 of 46 positions shown; findings below may reference images not displayed]

FINDINGS: Lung bases are clear.  No pleural or pericardial fluid.
Pacemaker in place.

The liver has a normal appearance without contrast.  No calcified
gallstones.  The spleen is normal.  The pancreas is normal.  The
adrenal glands are normal.  The kidneys show mild atrophy.  There
are parapelvic cysts.  No stone, mass or hydronephrosis.  There is
atherosclerosis of the aorta but no aneurysm.  The IVC is
unremarkable.  No retroperitoneal mass or adenopathy.  The uterus
and adnexal regions appear unremarkable.  No free fluid or air.
There is diverticulosis without evidence of diverticulitis.  No
acute bowel pathology is evident.  There is chronic appearing
degenerative disease of the spine.  No evidence of fracture.
IMPRESSION: No acute or symptomatic abnormality identified.

## 2010-04-26 IMAGING — CR DG LUMBAR SPINE COMPLETE 4+V
5 series · 5 of 5 positions shown · non-contrast
Comparison: CT abdomen pelvis 04/23/2009

CLINICAL DATA: Fall 2 weeks ago.  Low back pain.

LUMBAR SPINE - COMPLETE 4+ VIEW

[view not recorded (1 of 5)]
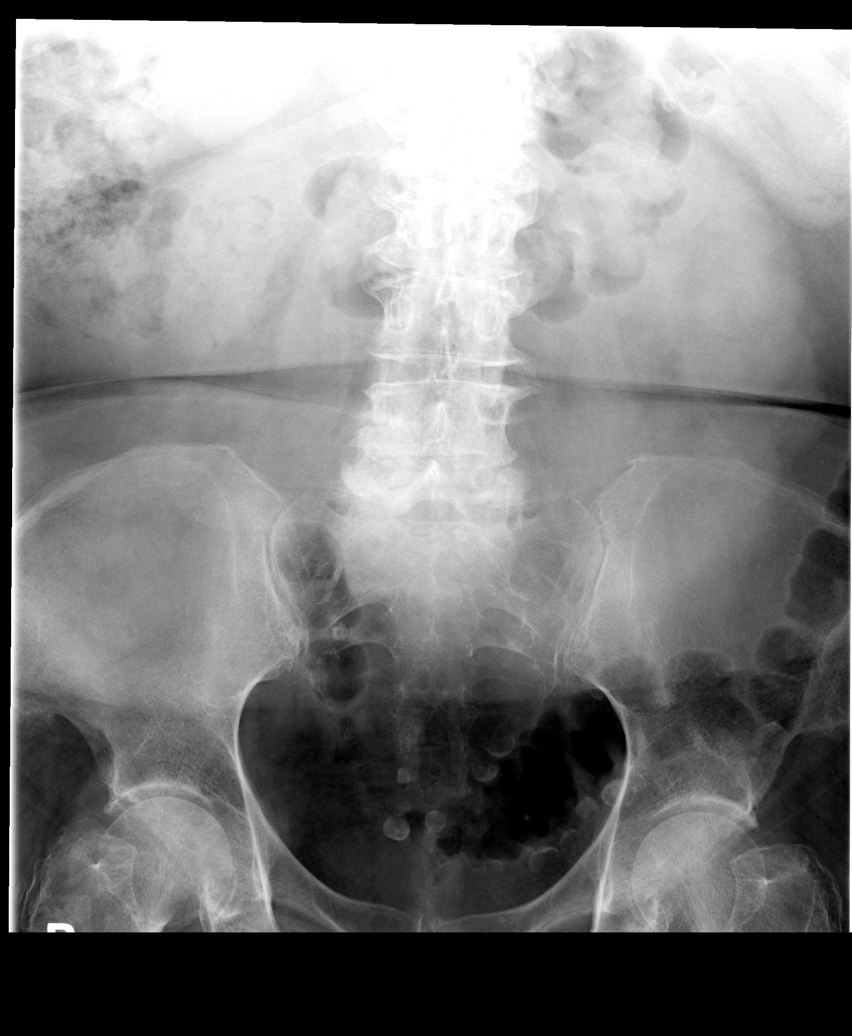

[view not recorded (2 of 5)]
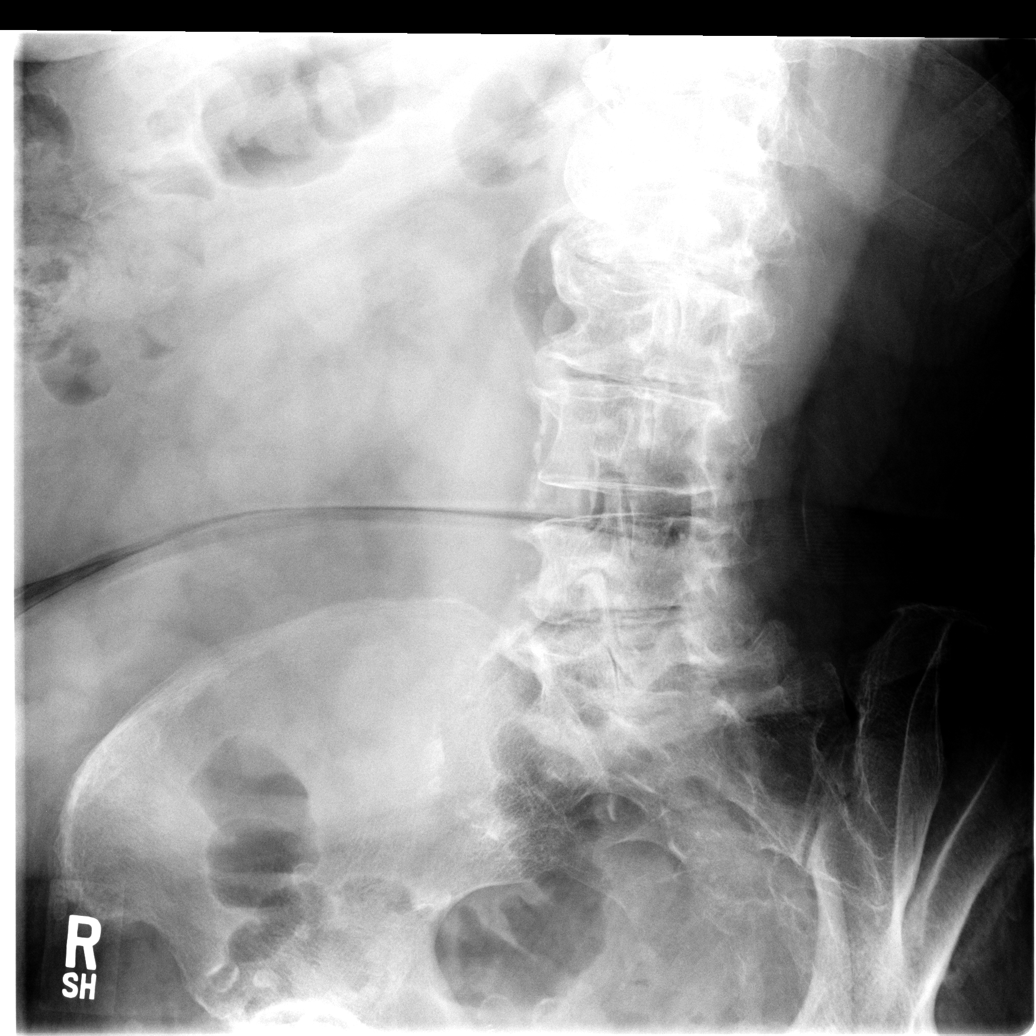

[view not recorded (3 of 5)]
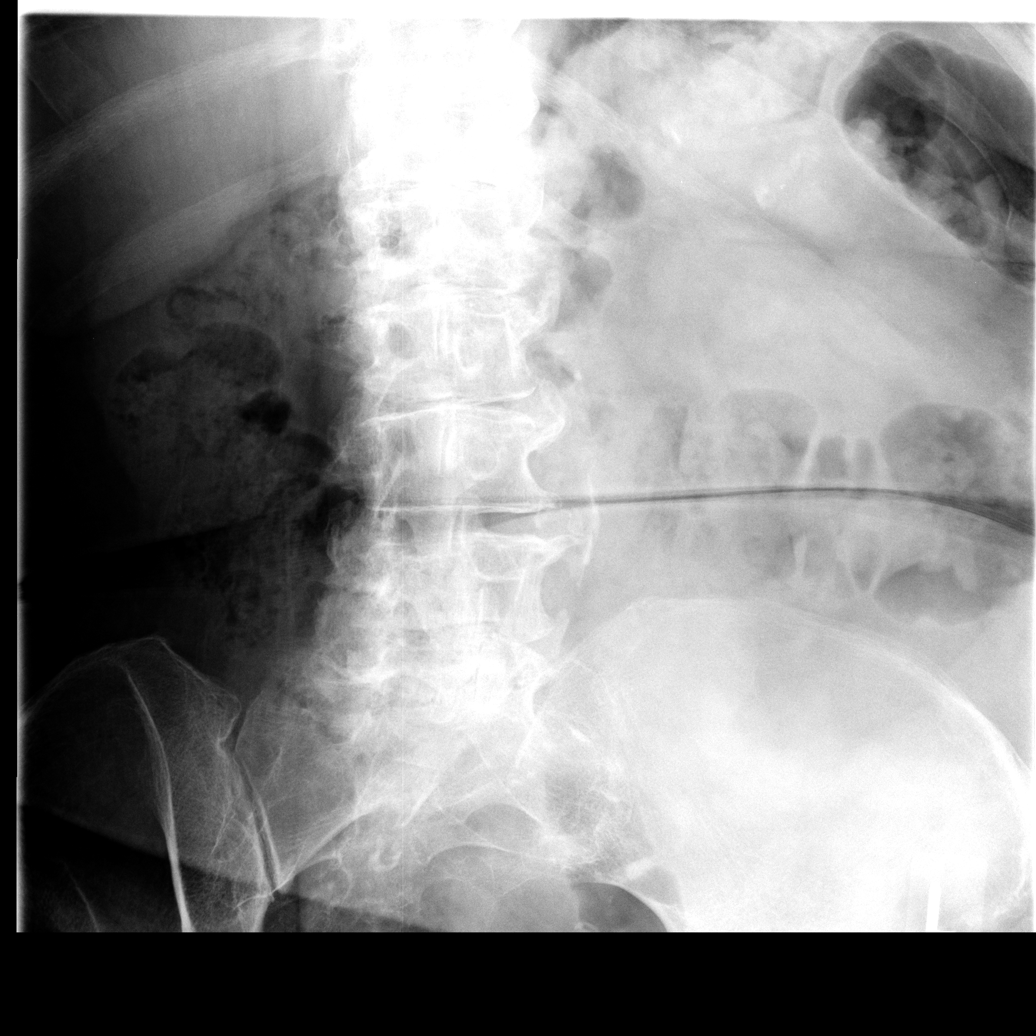

[view not recorded (4 of 5)]
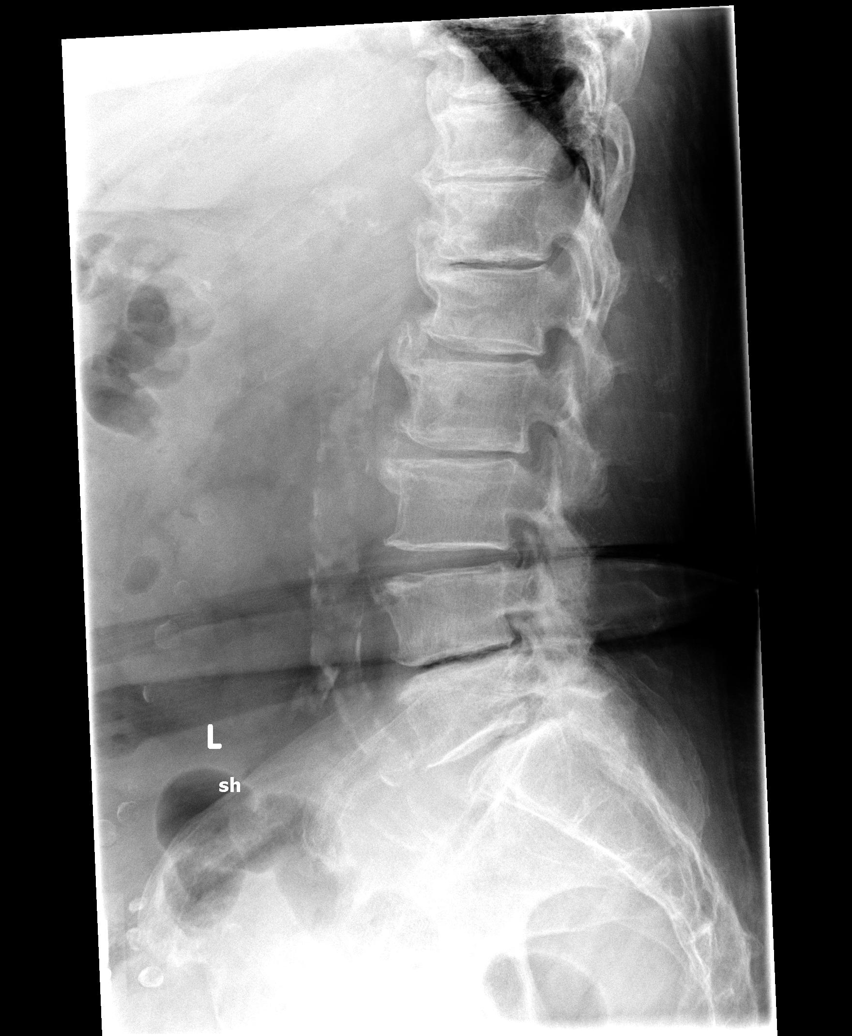

[view not recorded (5 of 5)]
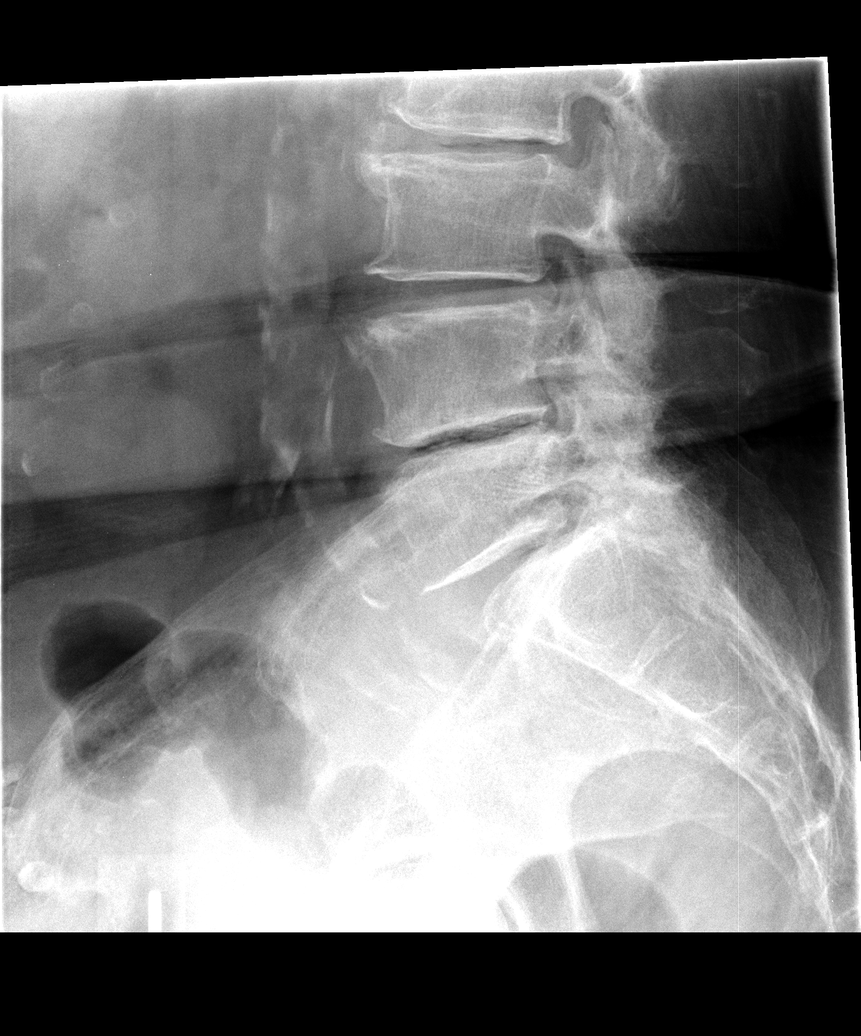

[5 of 5 positions shown; findings below may reference images not displayed]

FINDINGS: Advanced degenerative disc disease noted throughout the
lumbar spine.  There is severe facet disease in the lower lumbar
spine.  7 mm of anterolisthesis of L5 on S1, stable since prior CT,
likely related to facet disease.  SI joints are unremarkable.
IMPRESSION: Advanced degenerative disc and facet disease throughout the lumbar
spine as above.  Stable grade 1 anterolisthesis of L5 on S1.

## 2010-05-01 LAB — URINALYSIS, ROUTINE W REFLEX MICROSCOPIC
Bilirubin Urine: NEGATIVE
Glucose, UA: NEGATIVE mg/dL
Hgb urine dipstick: NEGATIVE
Ketones, ur: NEGATIVE mg/dL
Nitrite: NEGATIVE
Protein, ur: NEGATIVE mg/dL
Specific Gravity, Urine: 1.024 (ref 1.005–1.030)
Urobilinogen, UA: 0.2 mg/dL (ref 0.0–1.0)
pH: 5.5 (ref 5.0–8.0)

## 2010-05-01 LAB — URINE MICROSCOPIC-ADD ON

## 2010-05-03 LAB — URINALYSIS, ROUTINE W REFLEX MICROSCOPIC
Bilirubin Urine: NEGATIVE
Glucose, UA: NEGATIVE mg/dL
Hgb urine dipstick: NEGATIVE
Ketones, ur: NEGATIVE mg/dL
Nitrite: NEGATIVE
Protein, ur: NEGATIVE mg/dL
Specific Gravity, Urine: 1.015 (ref 1.005–1.030)
Urobilinogen, UA: 0.2 mg/dL (ref 0.0–1.0)
pH: 5.5 (ref 5.0–8.0)

## 2010-05-03 LAB — URINE MICROSCOPIC-ADD ON

## 2010-05-03 LAB — CBC
HCT: 39.2 % (ref 36.0–46.0)
Hemoglobin: 12.7 g/dL (ref 12.0–15.0)
MCHC: 32.3 g/dL (ref 30.0–36.0)
MCV: 78.9 fL (ref 78.0–100.0)
Platelets: 188 10*3/uL (ref 150–400)
RBC: 4.97 MIL/uL (ref 3.87–5.11)
RDW: 15.8 % — ABNORMAL HIGH (ref 11.5–15.5)
WBC: 8.3 10*3/uL (ref 4.0–10.5)

## 2010-05-03 LAB — DIFFERENTIAL
Basophils Absolute: 0 10*3/uL (ref 0.0–0.1)
Basophils Relative: 1 % (ref 0–1)
Eosinophils Absolute: 0.3 10*3/uL (ref 0.0–0.7)
Eosinophils Relative: 3 % (ref 0–5)
Lymphocytes Relative: 19 % (ref 12–46)
Lymphs Abs: 1.6 10*3/uL (ref 0.7–4.0)
Monocytes Absolute: 0.6 10*3/uL (ref 0.1–1.0)
Monocytes Relative: 7 % (ref 3–12)
Neutro Abs: 5.8 10*3/uL (ref 1.7–7.7)
Neutrophils Relative %: 70 % (ref 43–77)

## 2010-05-03 LAB — COMPREHENSIVE METABOLIC PANEL
ALT: 36 U/L — ABNORMAL HIGH (ref 0–35)
AST: 22 U/L (ref 0–37)
Albumin: 3.3 g/dL — ABNORMAL LOW (ref 3.5–5.2)
Alkaline Phosphatase: 102 U/L (ref 39–117)
BUN: 12 mg/dL (ref 6–23)
CO2: 28 mEq/L (ref 19–32)
Calcium: 9 mg/dL (ref 8.4–10.5)
Chloride: 104 mEq/L (ref 96–112)
Creatinine, Ser: 0.84 mg/dL (ref 0.4–1.2)
GFR calc Af Amer: >60 mL/min (ref >60)
GFR calc non Af Amer: >60 mL/min (ref >60)
Glucose, Bld: 111 mg/dL — ABNORMAL HIGH (ref 70–99)
Potassium: 4.7 mEq/L (ref 3.5–5.1)
Sodium: 136 mEq/L (ref 135–145)
Total Bilirubin: 0.5 mg/dL (ref 0.3–1.2)
Total Protein: 6.8 g/dL (ref 6.0–8.3)

## 2010-05-03 LAB — URINE CULTURE: Colony Count: 70000

## 2010-05-03 LAB — LIPASE, BLOOD: Lipase: 32 U/L (ref 11–59)

## 2010-05-11 IMAGING — CR DG CHEST 2V
2 series · 2 of 2 positions shown · non-contrast
Comparison: 02/16/2008

CLINICAL DATA: Shortness of breath.

CHEST - 2 VIEW

[w chest pa]
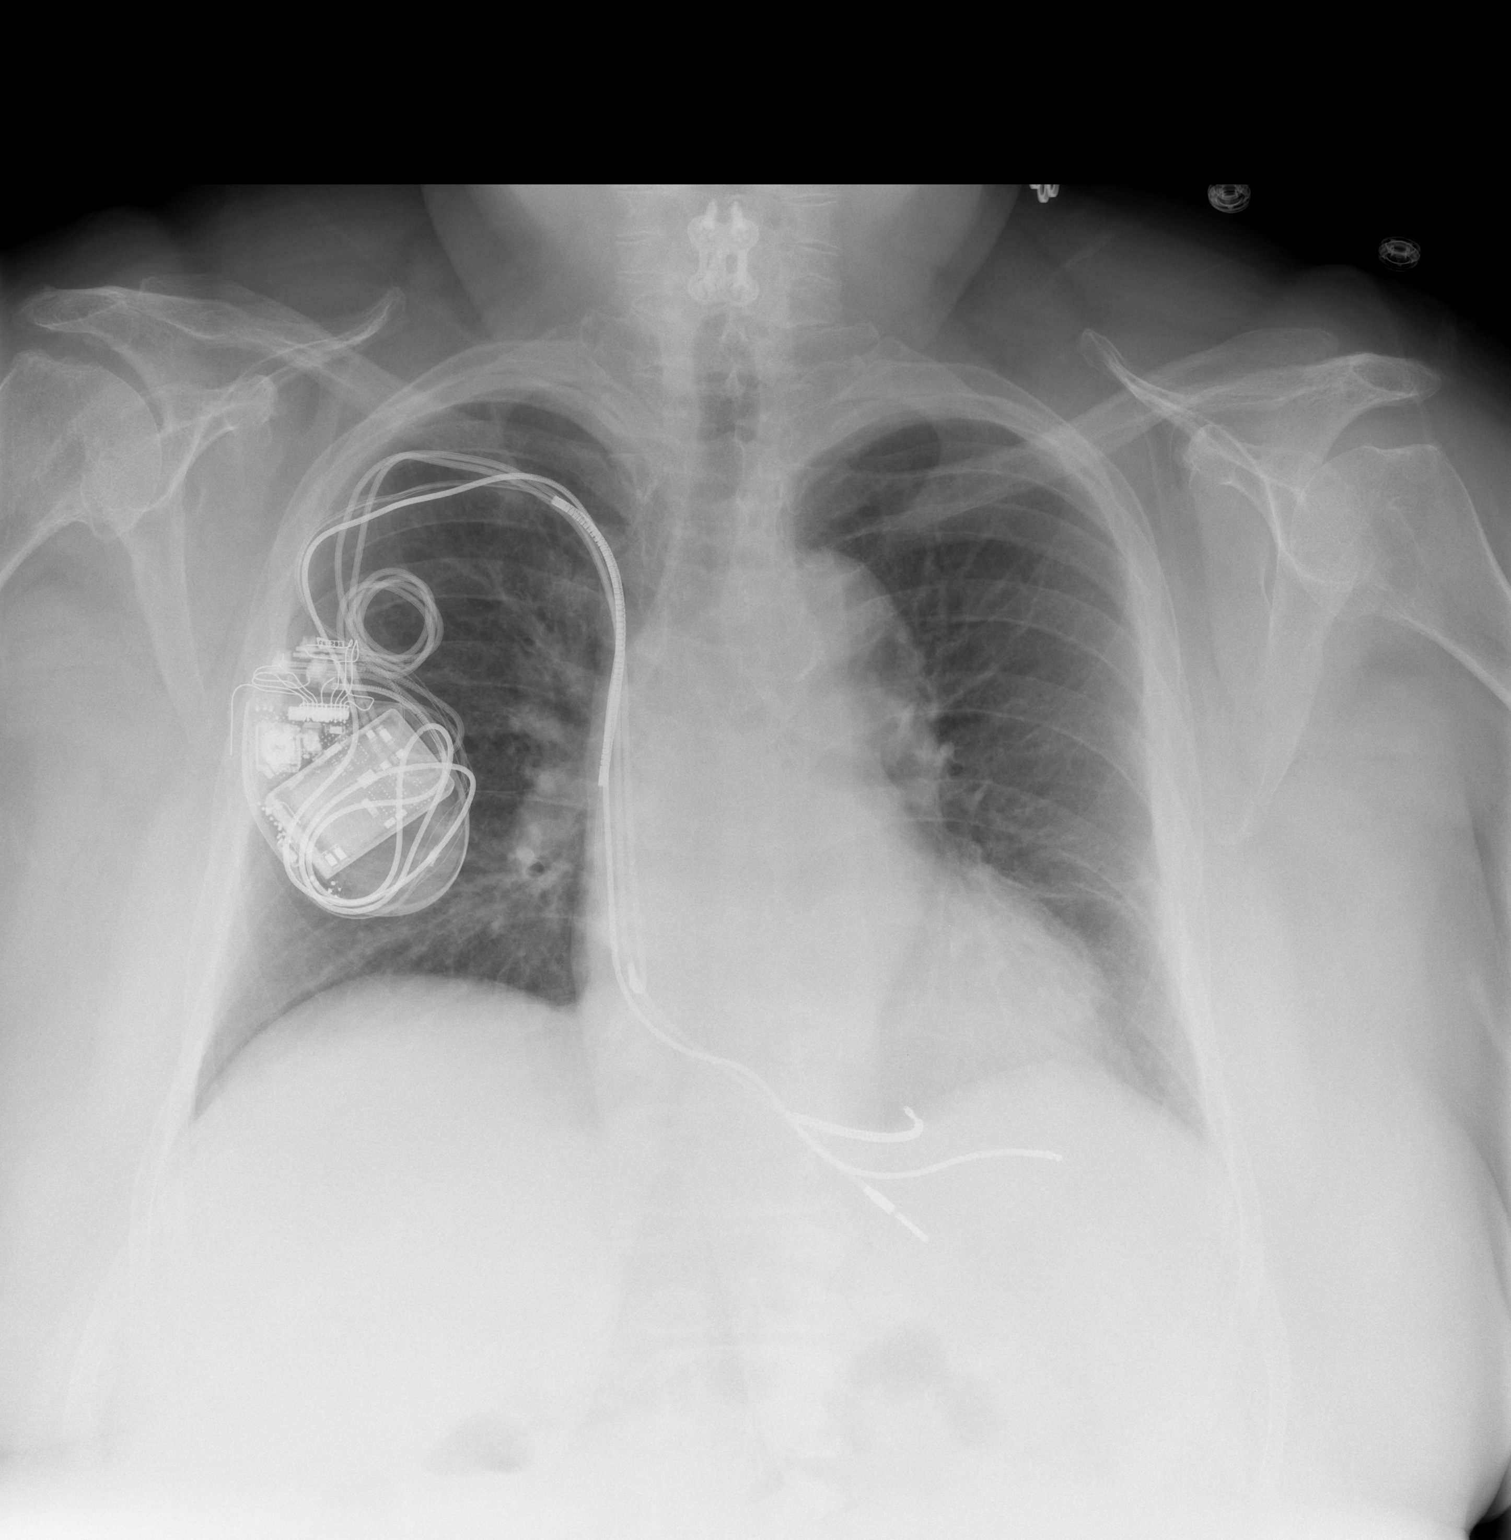

[w chest lat]
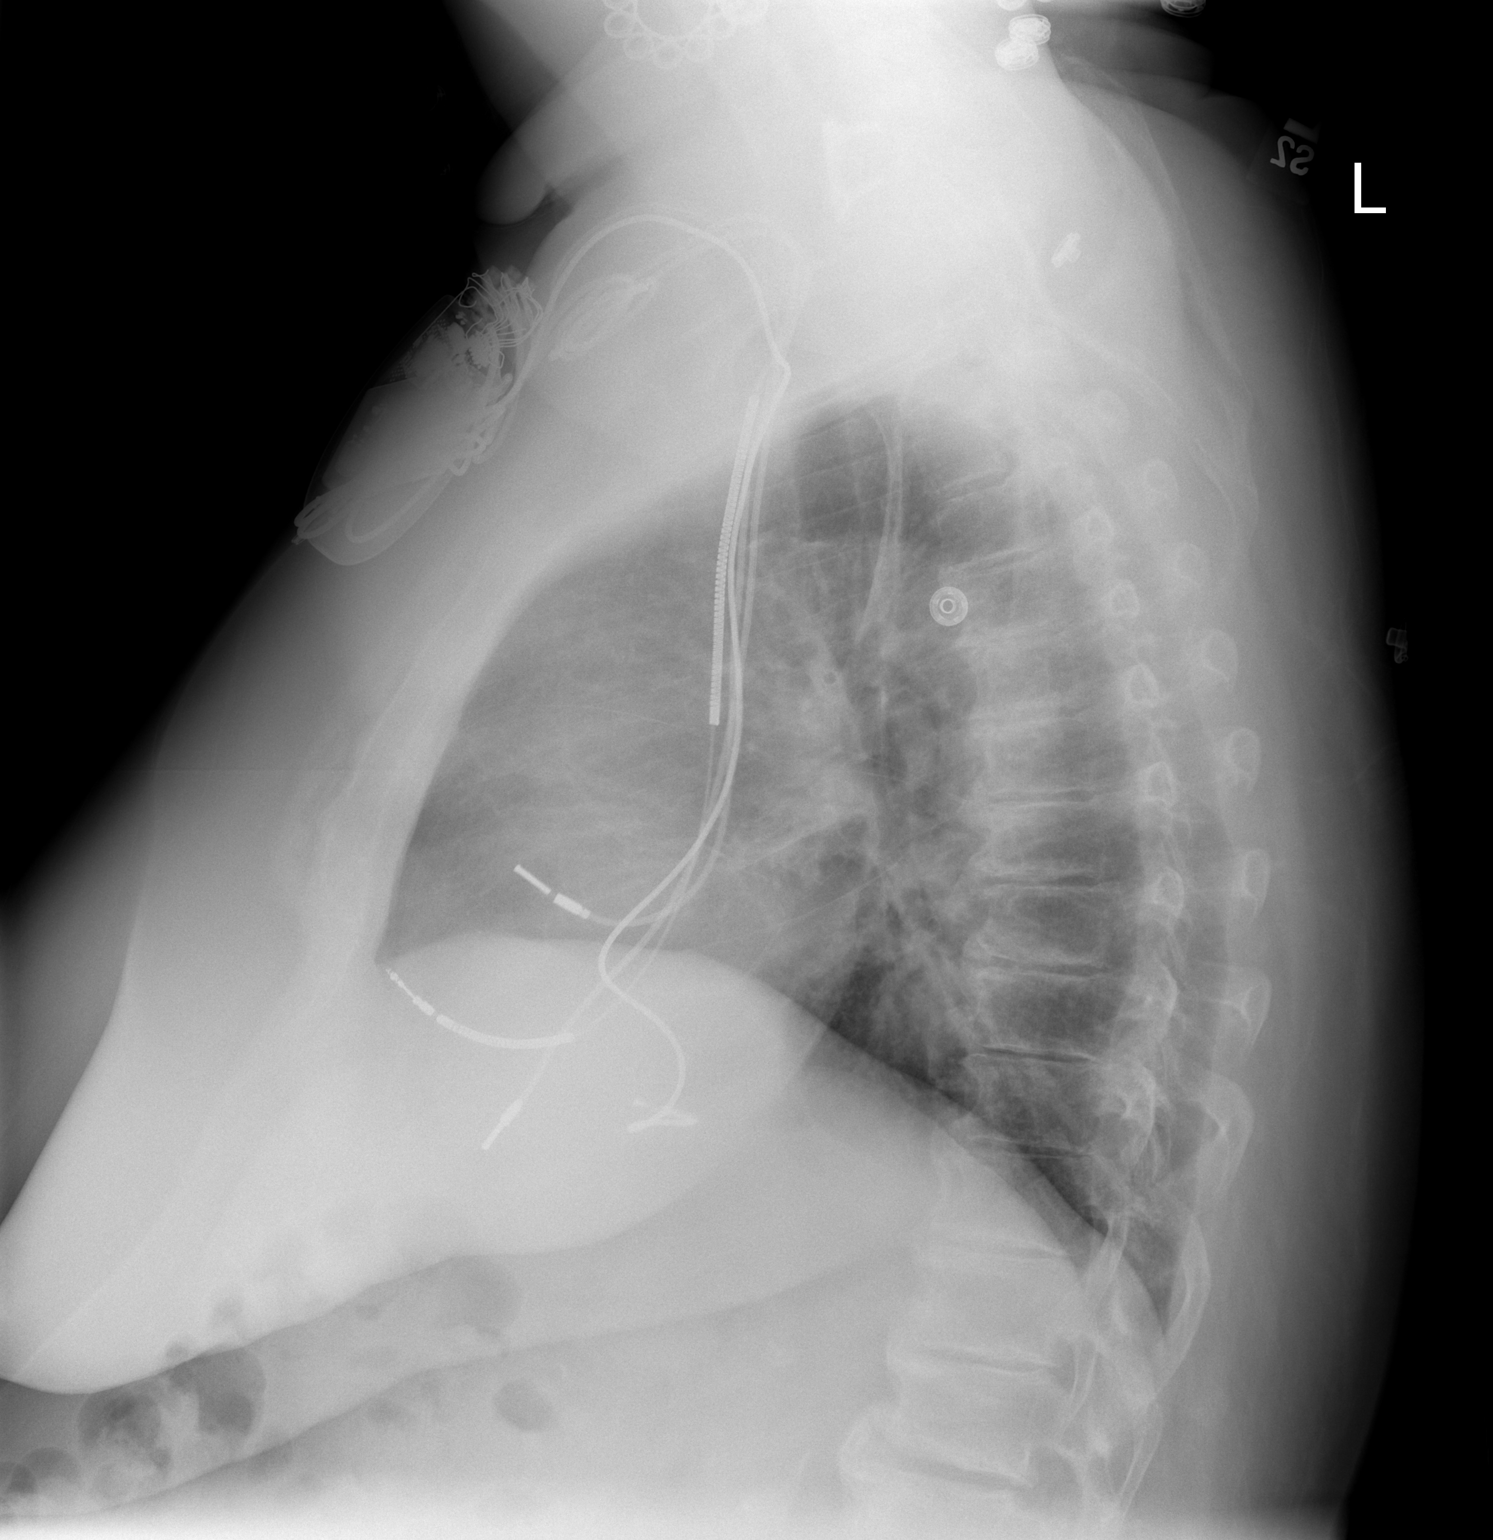

[2 of 2 positions shown; findings below may reference images not displayed]

FINDINGS: Right AICD remains in place, unchanged.  Minimal lingular
scarring or atelectasis.  Otherwise lungs are clear.  Heart is
upper limits normal in size.  No effusions.
IMPRESSION: Minimal lingular scarring or atelectasis.

## 2010-05-11 IMAGING — CR DG RIBS W/ CHEST 3+V*R*
6 series · 6 of 6 positions shown · non-contrast
Comparison: Today's chest x-ray.

CLINICAL DATA: Fall 2 weeks ago.  Right lower rib pain.

RIGHT RIBS AND CHEST - 3+ VIEW

[w ribs ap/pa upper right]
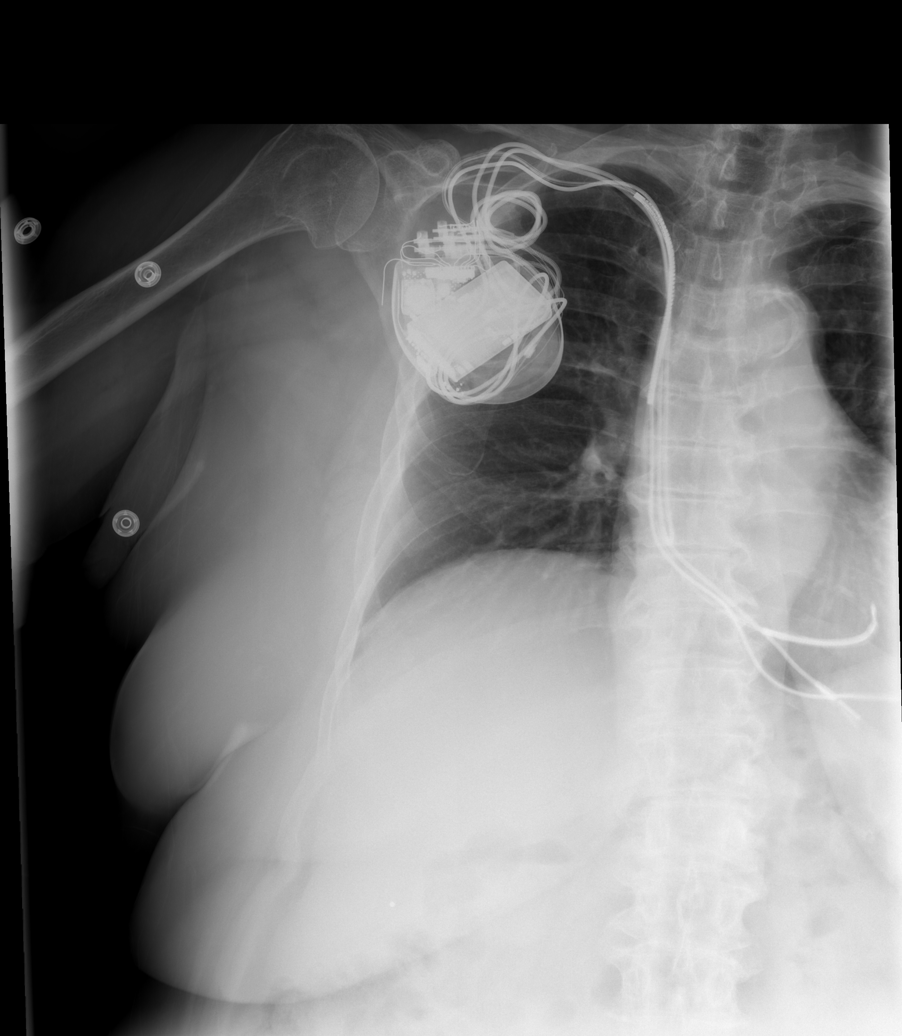

[w ribs ap/pa lower right *]
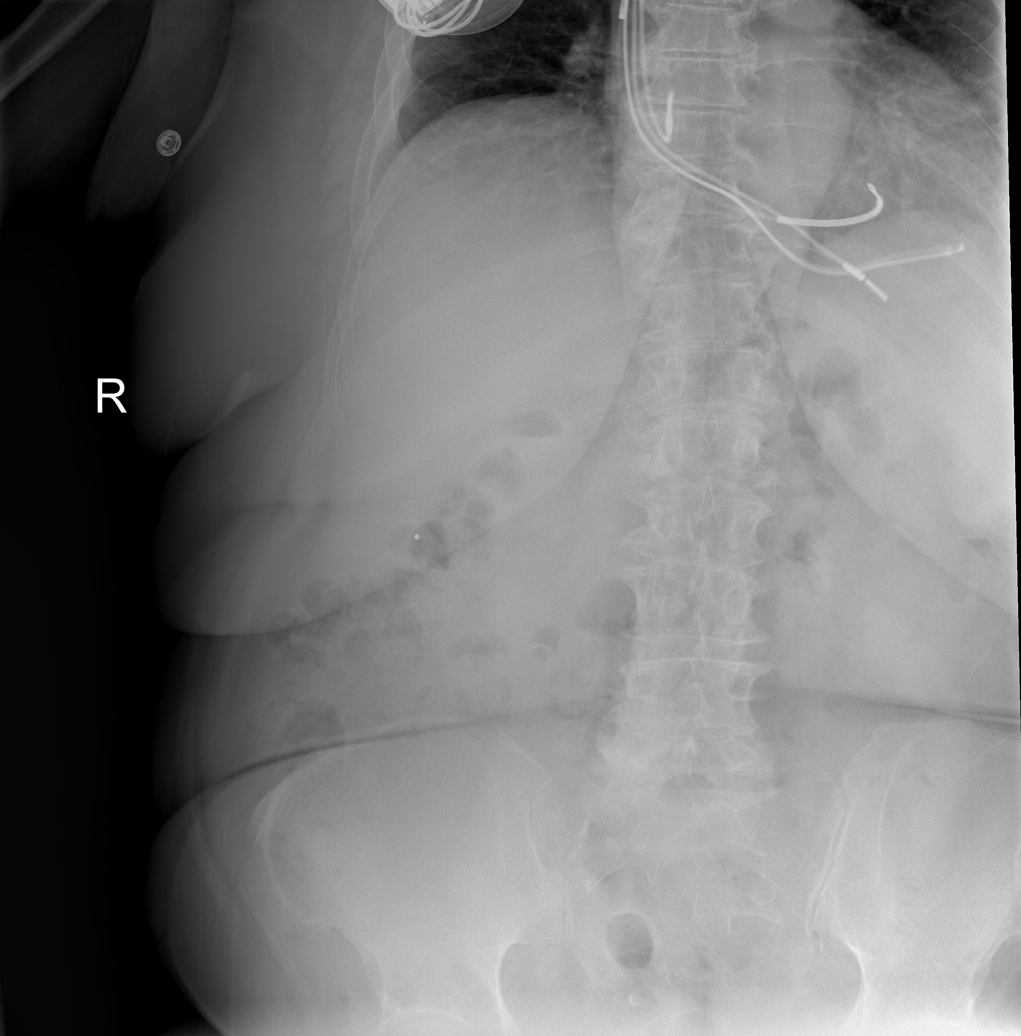

[w ribs oblique right (1 of 2)]
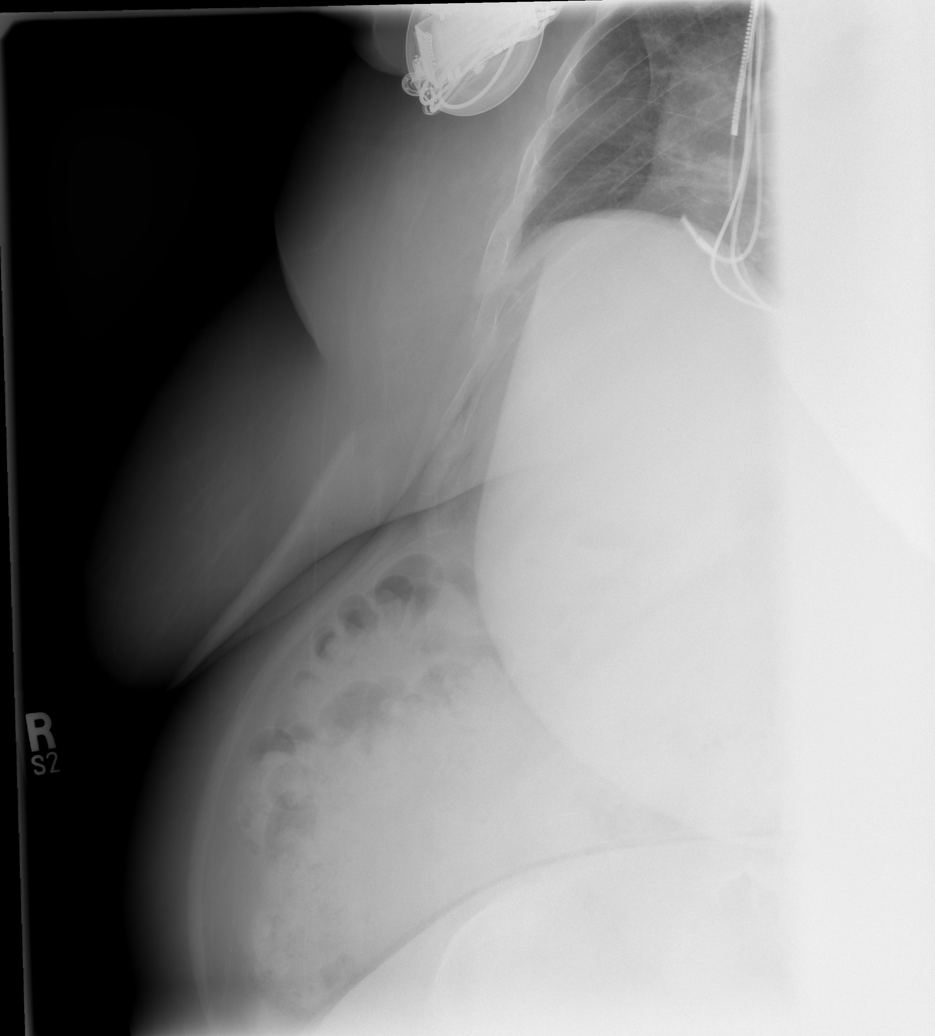

[w ribs oblique right * (1 of 2)]
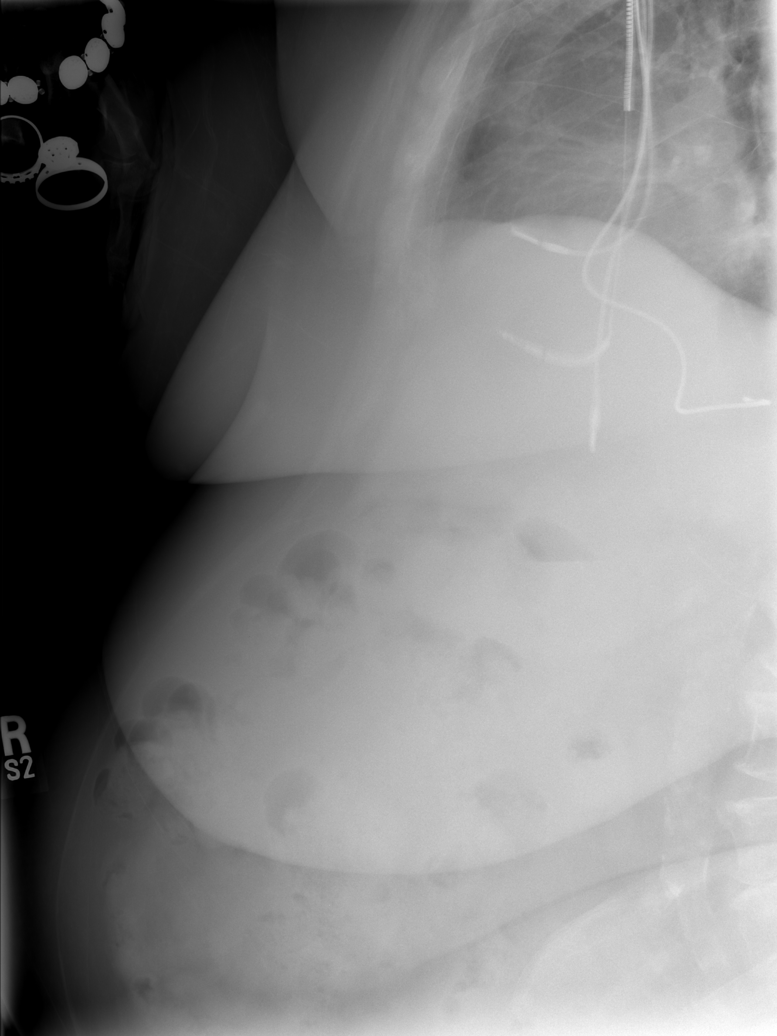

[w ribs oblique right (2 of 2)]
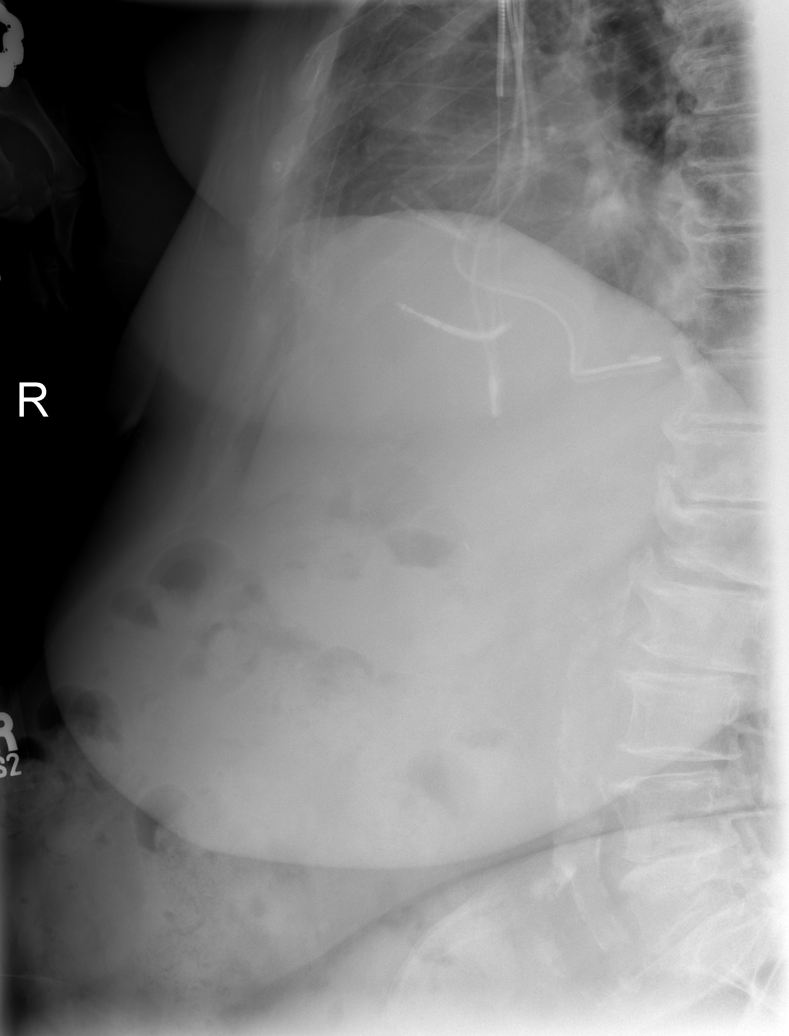

[w ribs oblique right * (2 of 2)]
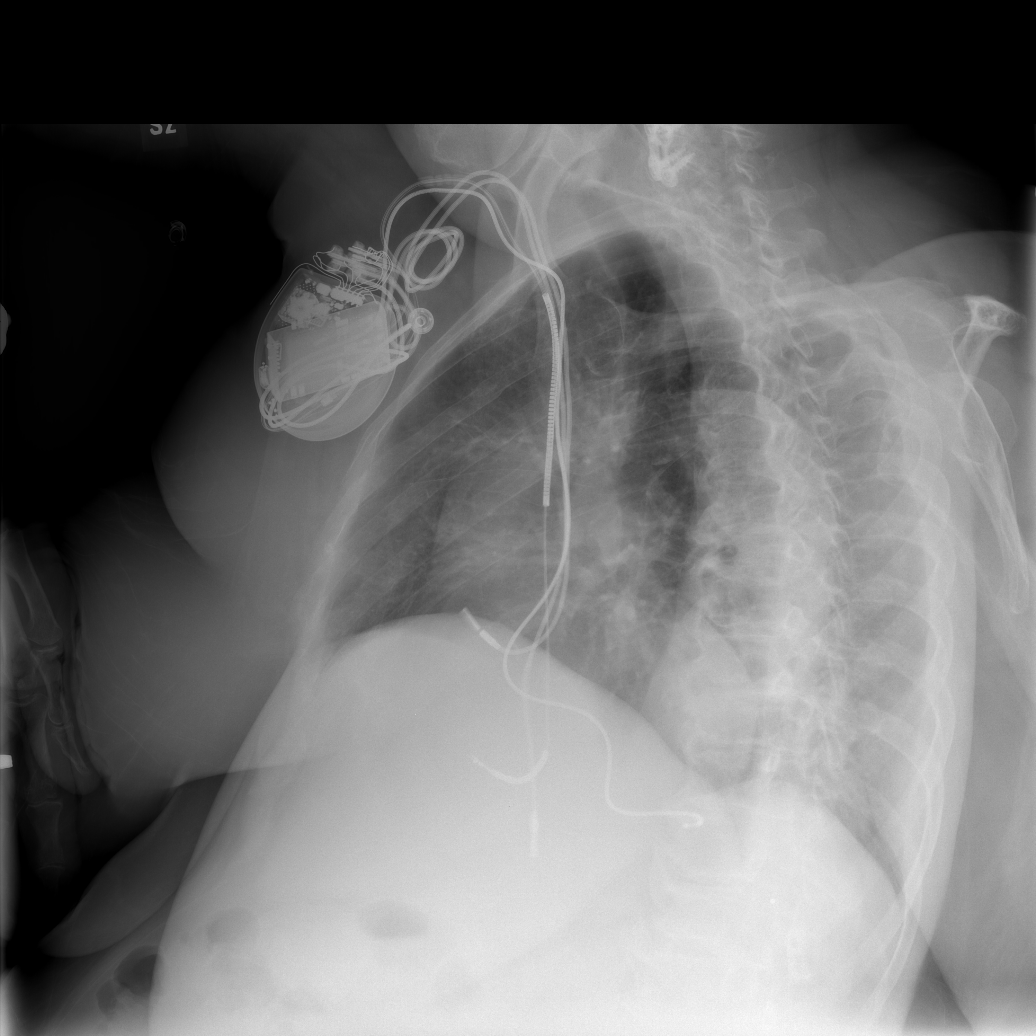

[6 of 6 positions shown; findings below may reference images not displayed]

FINDINGS: No definite acute bony abnormality.  No rib fracture
visualized.  The lower ribs are somewhat difficult to visualize due
to body habitus.

Right lung is clear.  No pneumothorax.  Degenerative changes in the
right shoulder.
IMPRESSION: No definite right rib fracture.  No pneumothorax.

## 2010-05-27 LAB — TROPONIN I
Troponin I: 0.01 ng/mL (ref 0.00–0.06)
Troponin I: 0.01 ng/mL (ref 0.00–0.06)

## 2010-05-27 LAB — BRAIN NATRIURETIC PEPTIDE: Pro B Natriuretic peptide (BNP): 71 pg/mL (ref 0.0–100.0)

## 2010-05-27 LAB — CBC
HCT: 37.5 % (ref 36.0–46.0)
HCT: 37.6 % (ref 36.0–46.0)
HCT: 37.8 % (ref 36.0–46.0)
Hemoglobin: 11.8 g/dL — ABNORMAL LOW (ref 12.0–15.0)
Hemoglobin: 11.9 g/dL — ABNORMAL LOW (ref 12.0–15.0)
Hemoglobin: 12.2 g/dL (ref 12.0–15.0)
MCHC: 31.3 g/dL (ref 30.0–36.0)
MCHC: 31.8 g/dL (ref 30.0–36.0)
MCHC: 32.3 g/dL (ref 30.0–36.0)
MCV: 78.6 fL (ref 78.0–100.0)
MCV: 79.5 fL (ref 78.0–100.0)
MCV: 79.9 fL (ref 78.0–100.0)
Platelets: 186 10*3/uL (ref 150–400)
Platelets: 203 10*3/uL (ref 150–400)
Platelets: 219 10*3/uL (ref 150–400)
RBC: 4.72 MIL/uL (ref 3.87–5.11)
RBC: 4.72 MIL/uL (ref 3.87–5.11)
RBC: 4.79 MIL/uL (ref 3.87–5.11)
RDW: 15 % (ref 11.5–15.5)
RDW: 15.1 % (ref 11.5–15.5)
RDW: 15.2 % (ref 11.5–15.5)
WBC: 5.3 10*3/uL (ref 4.0–10.5)
WBC: 8.3 10*3/uL (ref 4.0–10.5)
WBC: 9.2 10*3/uL (ref 4.0–10.5)

## 2010-05-27 LAB — DIFFERENTIAL
Basophils Absolute: 0 10*3/uL (ref 0.0–0.1)
Basophils Relative: 1 % (ref 0–1)
Eosinophils Absolute: 0.2 10*3/uL (ref 0.0–0.7)
Eosinophils Relative: 2 % (ref 0–5)
Lymphocytes Relative: 25 % (ref 12–46)
Lymphs Abs: 2 10*3/uL (ref 0.7–4.0)
Monocytes Absolute: 0.5 10*3/uL (ref 0.1–1.0)
Monocytes Relative: 6 % (ref 3–12)
Neutro Abs: 5.5 10*3/uL (ref 1.7–7.7)
Neutrophils Relative %: 67 % (ref 43–77)

## 2010-05-27 LAB — BASIC METABOLIC PANEL
BUN: 17 mg/dL (ref 6–23)
BUN: 19 mg/dL (ref 6–23)
CO2: 22 mEq/L (ref 19–32)
CO2: 27 mEq/L (ref 19–32)
Calcium: 9.2 mg/dL (ref 8.4–10.5)
Calcium: 9.2 mg/dL (ref 8.4–10.5)
Chloride: 102 mEq/L (ref 96–112)
Chloride: 103 mEq/L (ref 96–112)
Creatinine, Ser: 1.08 mg/dL (ref 0.4–1.2)
Creatinine, Ser: 1.09 mg/dL (ref 0.4–1.2)
GFR calc Af Amer: >60 mL/min (ref >60)
GFR calc Af Amer: >60 mL/min (ref >60)
GFR calc non Af Amer: 51 mL/min — ABNORMAL LOW (ref >60)
GFR calc non Af Amer: 51 mL/min — ABNORMAL LOW (ref >60)
Glucose, Bld: 146 mg/dL — ABNORMAL HIGH (ref 70–99)
Glucose, Bld: 99 mg/dL (ref 70–99)
Potassium: 4.1 mEq/L (ref 3.5–5.1)
Potassium: 4.1 mEq/L (ref 3.5–5.1)
Sodium: 133 mEq/L — ABNORMAL LOW (ref 135–145)
Sodium: 137 mEq/L (ref 135–145)

## 2010-05-27 LAB — URINALYSIS, ROUTINE W REFLEX MICROSCOPIC
Bilirubin Urine: NEGATIVE
Glucose, UA: NEGATIVE mg/dL
Hgb urine dipstick: NEGATIVE
Ketones, ur: NEGATIVE mg/dL
Nitrite: NEGATIVE
Protein, ur: NEGATIVE mg/dL
Specific Gravity, Urine: 1.017 (ref 1.005–1.030)
Urobilinogen, UA: 1 mg/dL (ref 0.0–1.0)
pH: 6.5 (ref 5.0–8.0)

## 2010-05-27 LAB — POCT I-STAT, CHEM 8
BUN: 25 mg/dL — ABNORMAL HIGH (ref 6–23)
Calcium, Ion: 1.08 mmol/L — ABNORMAL LOW (ref 1.12–1.32)
Chloride: 106 mEq/L (ref 96–112)
Creatinine, Ser: 1.3 mg/dL — ABNORMAL HIGH (ref 0.4–1.2)
Glucose, Bld: 146 mg/dL — ABNORMAL HIGH (ref 70–99)
HCT: 39 % (ref 36.0–46.0)
Hemoglobin: 13.3 g/dL (ref 12.0–15.0)
Potassium: 4.2 mEq/L (ref 3.5–5.1)
Sodium: 139 mEq/L (ref 135–145)
TCO2: 25 mmol/L (ref 0–100)

## 2010-05-27 LAB — POCT CARDIAC MARKERS
CKMB, poc: 2.3 ng/mL (ref 1.0–8.0)
Myoglobin, poc: 122 ng/mL (ref 12–200)
Troponin i, poc: <0.05 ng/mL (ref 0.00–0.09)

## 2010-05-27 LAB — CK TOTAL AND CKMB (NOT AT ARMC)
CK, MB: 2.3 ng/mL (ref 0.3–4.0)
CK, MB: 2.3 ng/mL (ref 0.3–4.0)
CK, MB: 2.5 ng/mL (ref 0.3–4.0)
Relative Index: 1.5 (ref 0.0–2.5)
Relative Index: 1.7 (ref 0.0–2.5)
Relative Index: 1.9 (ref 0.0–2.5)
Total CK: 134 U/L (ref 7–177)
Total CK: 134 U/L (ref 7–177)
Total CK: 156 U/L (ref 7–177)

## 2010-05-27 LAB — MAGNESIUM: Magnesium: 2.2 mg/dL (ref 1.5–2.5)

## 2010-05-27 LAB — COMPREHENSIVE METABOLIC PANEL
ALT: 25 U/L (ref 0–35)
AST: 24 U/L (ref 0–37)
Albumin: 3.5 g/dL (ref 3.5–5.2)
Alkaline Phosphatase: 106 U/L (ref 39–117)
BUN: 18 mg/dL (ref 6–23)
CO2: 26 mEq/L (ref 19–32)
Calcium: 9.4 mg/dL (ref 8.4–10.5)
Chloride: 108 mEq/L (ref 96–112)
Creatinine, Ser: 1 mg/dL (ref 0.4–1.2)
GFR calc Af Amer: >60 mL/min (ref >60)
GFR calc non Af Amer: 56 mL/min — ABNORMAL LOW (ref >60)
Glucose, Bld: 104 mg/dL — ABNORMAL HIGH (ref 70–99)
Potassium: 3.9 mEq/L (ref 3.5–5.1)
Sodium: 141 mEq/L (ref 135–145)
Total Bilirubin: 0.6 mg/dL (ref 0.3–1.2)
Total Protein: 6.7 g/dL (ref 6.0–8.3)

## 2010-05-27 LAB — LIPID PANEL
Cholesterol: 252 mg/dL — ABNORMAL HIGH (ref 0–200)
HDL: 42 mg/dL (ref >39)
LDL Cholesterol: 185 mg/dL — ABNORMAL HIGH (ref 0–99)
Total CHOL/HDL Ratio: 6 RATIO
Triglycerides: 123 mg/dL (ref <150)
VLDL: 25 mg/dL (ref 0–40)

## 2010-05-27 LAB — URINE MICROSCOPIC-ADD ON

## 2010-05-27 LAB — D-DIMER, QUANTITATIVE: D-Dimer, Quant: 1.09 ug/mL-FEU — ABNORMAL HIGH (ref 0.00–0.48)

## 2010-05-27 LAB — PROTIME-INR
INR: 0.9 (ref 0.00–1.49)
INR: 1 (ref 0.00–1.49)
INR: 1 (ref 0.00–1.49)
Prothrombin Time: 12.7 seconds (ref 11.6–15.2)
Prothrombin Time: 13.1 seconds (ref 11.6–15.2)
Prothrombin Time: 13.2 seconds (ref 11.6–15.2)

## 2010-05-27 LAB — HEMOGLOBIN A1C
Hgb A1c MFr Bld: 6.4 % — ABNORMAL HIGH (ref 4.6–6.1)
Mean Plasma Glucose: 137 mg/dL

## 2010-05-27 LAB — TSH: TSH: 4.013 u[IU]/mL (ref 0.350–4.500)

## 2010-05-27 LAB — APTT: aPTT: 32 seconds (ref 24–37)

## 2010-05-27 LAB — H. PYLORI ANTIBODY, IGG: H Pylori IgG: 0.8 {ISR}

## 2010-06-25 NOTE — Op Note (Signed)
NAME:  [name], [name]             ACCOUNT NO.:  [id number]   MEDICAL RECORD NO.:  [id number]           PATIENT TYPE:   LOCATION:                                 FACILITY:   PHYSICIAN:  Sheral Apley. Weingold, M.D.DATE OF BIRTH:  Apr 24, 1945   DATE OF PROCEDURE:  07/02/2007  DATE OF DISCHARGE:                               OPERATIVE REPORT   PREOPERATIVE DIAGNOSIS:  Fracture dislocation, right fifth digit  proximal interphalangeal joint.   PREOPERATIVE DIAGNOSIS:  Fracture dislocation, right fifth digit  proximal interphalangeal joint.   PROCEDURE:  Open reduction and internal fixation, right middle  phalangeal fracture at the PIP joint with 1.3-mm T-plate dorsally with 4  screws.   SURGEON:  Schuyler Amor, MD   ASSISTANT:  None.   ANESTHESIA:  General axillary block.   TOURNIQUET TIME:  43 minutes.   COMPLICATIONS:  No complication.   DRAINS:  None.   REPORT:  The patient was taken to the operating suite after induction of  adequate axillary block analgesia and mild IV sedation, right upper  extremity was prepped and draped in usual sterile fashion.  An Esmarch  was used to exsanguinate the limb. Tourniquet was inflated to 250 mmHg.  At this point in time, an incision was made C-shaped of the PIP joint.  A large radial based flap was elevated.  The extensor mechanism was  longitudinally incised over the PIP joint and retracted both radially  and ulnarly.  The capsule was carefully incised.  There was an  intraarticular fracture dislocation with a large comminuted dorsal lip  fragment at the dorsal aspect middle phalanx.  The fracture site was  sharply debrided with severe comminution.  Provisional fixation was  attempted using 0.28 K-wire, this was unsuccessful.  Manual traction was  able to maintain the reduction.  At this point time, a T-plate 1.3-mm  was fashioned to the dorsal aspect of the middle phalanx.  It was cut  and inserted with  3 holes distally and 3  across proximally.  The distal  straight portion was fixed to the dorsal shaft using two 1.3-mm screws  buttressing the dorsal loop fragment with manual pressure.  After this  was done, 2 screws were then placed in the T-portion, again one 6  mm.  Intraoperative fluoroscopy then revealed reduction of the joint in  both the AP lateral plane.  Wound was irrigated and loosely closed in  layers with 4-0 Vicryl and 4-0 nylon on skin.  Xeroform, 4x4s, and ulnar  gutter splint was applied.  The patient tolerated the procedure well,  went to recovery room in stable fashion.      Sheral Apley Burney Gauze, M.D.  Electronically Signed     MAW/MEDQ  D:  07/02/2007  T:  07/03/2007  Job:  [id number]

## 2010-06-25 NOTE — Procedures (Signed)
NAME:  [name], [name]             ACCOUNT NO.:  [id number]   MEDICAL RECORD NO.:  [id number]          PATIENT TYPE:  OUT   LOCATION:  SLEEP CENTER                 FACILITY:  [facility]   PHYSICIAN:  Clinton D. Annamaria Boots, MD, FCCP, FACPDATE OF BIRTH:  1945/07/[birth year]   DATE OF STUDY:  06/30/2006                            NOCTURNAL POLYSOMNOGRAM   REFERRING PHYSICIAN:  Ileene Hutchinson T. Erik Obey, M.D.   INDICATION FOR STUDY:  Hypersomnia with sleep apnea.   EPWORTH SLEEPINESS SCORE:  8/24.  BMI 33, weight 206 pounds.   MEDICATIONS:  Home medications are listed and reviewed.  Pacemaker.   SLEEP ARCHITECTURE:  Short total sleep time, 284 minutes, with sleep  efficiency 63%.  Stage I was 5%, stage II 60%, stage III and IV 21%, REM  14% of total sleep time.  Sleep latency 15 minutes, REM latency 58  minutes, awake after sleep onset 130 minutes, arousal index 21.3.  Prilosec, diltiazem, Toprol, and Mucinex were taken at 10 p.m.   RESPIRATORY DATA:  Apnea/hypopnea index (AHI, RDI) 58.9 obstructive  events per hour.  This is included 1 central apnea, 146 obstructive  apneas, 2 mixed apneas, and 130 hypopneas.  Events were not positional.  REM AHI 79.5.  She was intolerant of CPAP and refused to wear it,  stating the mask was not a problem but she could not tolerate the air  flow with masks tried.   OXYGEN DATA:  Moderate snoring, with oxygen desaturation to a nadir of  58%.  Mean oxygen saturation through the study was 89% on room air.   CARDIAC DATA:  Rhythm appears to be paced at about 81 beats per minute.   MOVEMENT-PARASOMNIA:  No significant limb jerks or movement disturbance.   IMPRESSIONS-RECOMMENDATIONS:  1. Severe obstructive sleep apnea/hypopnea syndrome.  AHI 58.9 per      hour, with moderate snoring, and oxygen saturation to a nadir of      58%.  2. Mean oxygen saturation through the study was 89% on room air,      suggesting probable underlying cardiopulmonary disease.  3. She was intolerant  of CPAP and unwilling to wear it from the      outset, saying that she did not want anything on her face and that      she did not like the air flow.      Clinton D. Annamaria Boots, MD, [facility], FACP  Diplomate, Tax adviser of Sleep Medicine  Electronically Signed     CDY/MEDQ  D:  07/06/2006 08:56:55  T:  07/06/2006 09:50:35  Job:  [id number]

## 2010-06-25 NOTE — Op Note (Signed)
NAME:  [name], [name]             ACCOUNT NO.:  [id number]   MEDICAL RECORD NO.:  [id number]          PATIENT TYPE:  AMB   LOCATION:  SDS                          FACILITY:  [facility]   PHYSICIAN:  Sheral Apley. Weingold, M.D.DATE OF BIRTH:  April 09, 1945   DATE OF PROCEDURE:  10/25/2007  DATE OF DISCHARGE:  10/25/2007                               OPERATIVE REPORT   PREOPERATIVE DIAGNOSIS:  Right fifth digit middle phalangeal fracture at  the proximal interphalangeal joint.   POSTOPERATIVE DIAGNOSIS:  Right fifth digit middle phalangeal fracture  at the proximal interphalangeal joint.   PROCEDURE:  Hardware removal and extensor tenolysis above.   SURGEON:  Sheral Apley. Burney Gauze, MD   ASSISTANT:  None.   ANESTHESIA:  General.   TOURNIQUET TIME:  38 minutes.   COMPLICATIONS:  None.   DRAINS:  None.   OPERATIVE REPORT:  The patient was taken to the operating suite after  induction of adequate general anesthesia.  Right upper extremity was  prepped and draped in sterile fashion.  Esmarch was used to exsanguinate  the limb and tourniquet inflated to 15 mmHg at this point in time.  A C-  shaped incision was made on the dorsal aspect of the PIP joint, right  little finger using a previous surgical site.  A flap was raised.    Dictation ended at this point.      Sheral Apley Burney Gauze, M.D.  Electronically Signed     MAW/MEDQ  D:  10/25/2007  T:  10/26/2007  Job:  [id number]

## 2010-06-25 NOTE — Cardiovascular Report (Signed)
NAME:  [name]             ACCOUNT NO.:  [id number]   MEDICAL RECORD NO.:  [id number]          PATIENT TYPE:  INP   LOCATION:  3736                         FACILITY:  [facility]   PHYSICIAN:  Tery Sanfilippo, MD     DATE OF BIRTH:  1946-01-02   DATE OF PROCEDURE:  DATE OF DISCHARGE:                            CARDIAC CATHETERIZATION   This is a right and left cardiac catheterization.   PROCEDURES PERFORMED:  1. Retrograde left heart catheterization.  2. Selective coronary arteriography.  3. Left ventriculography.  4. Right heart catheterization.   INDICATIONS:  [name] is a 65 year old female with nonischemic  cardiomyopathy, status post BiV ICD upgrade from a pacer, original pacer  insertion in September 2008.  She underwent diagnostic coronary  angiography in 2004, which demonstrated mild disease in the LAD,  specifically with a 30% narrowing after the takeoff of the second  diagonal with also additional ostial first diagonal 50% narrowing and an  ostial second diagonal 40% narrowing.  At that time she had an EF of 35-  40%.  She was treated with medical therapy and was thought to have a  nonischemic cardiomyopathy and she was readmitted to [facility]  on April 24, 2007, with a repeat episode of chest pain and shortness of  breath with negative troponin and no ECG changes.  She was, therefore,  brought to [facility] for right and  left diagnostic coronary angiography for further evaluation of shortness  of breath and chest pain.   APPROACH:  RFA and RFV.   SEDATION:  Versed 3 mg, Fentanyl 50 mcg.   LOCAL ANESTHETIC:  Lidocaine 1% 15 mL.   PREMEDICATION:  Solu-Medrol, Benadryl, and ranitidine.   COMPLICATIONS:  None.   PROCEDURE:  After informed consent the patient was brought to the second  floor of the [facility] to the cardiac catheterization  laboratory.  The right groin was prepped and draped in the usual  sterile  fashion.  She received 2 mg of Versed and 25 mcg of Fentanyl IV for  light sedation.  The right groin was infiltrated with local anesthesia  and the right femoral artery was accessed using the Seldinger technique  x1 puncture with a Cook needle.  Through the [facility] needle was placed a J  wire.  The Cook needle was then removed and then the 6-French sheath was  placed into the RFA for access.  The J wire was then removed.  The RFV  was then approached with local anesthetic and accessed using the  Seldinger technique once again with the [facility] needle.  Through the [facility]  needle was placed a short wire.  The Cook needle was then removed and a  7-French sheath was placed over the short wire and accessed into the  RFV.  Through the RFV was passed a Swan-Ganz catheter.  The Swan-Ganz  catheter was then passed under fluoro-guidance into the right atrium,  then the right ventricle, into the PA, and then wedged.  After pressures  were obtained in the right heart, cardiac outputs were obtained using  thermodilution technique  with 4 x 10 mL injections of normal saline.  Once that was obtained, simultaneous FA and PA sats were obtained to  evaluate cardiac output and index using the Fick method.  Once that had  been performed, the angled pigtail was passed over a J wire though the 6-  French sheath and RFA passed into the left ventricle without difficulty.  The J wire was then removed and the pigtail was then connected up to  channel one pressures and then simultaneous LV PW pressures,  simultaneous RV LW, and RA pressures were obtained for hemodynamic data.  Once the right heart pressures were obtain the Swan-Ganz catheter was  then removed.  Left ventriculography was performed using 36 mL of  contrast at 13 cc/second in the RAO oblique view.  The angled pigtail  catheter was then pulled back across the aortic valve to assess for a  gradient across the aortic valve.  The angled pigtail was then  exchanged  over a J wire for the JL4 catheter.  The JL4 catheter was then engaged  in the left main coronary artery without difficulty.  Views of the left  coronary artery circulation system were obtained in the LAO cranial, RAO  cranial, RAO caudal, LAO caudal views.  The JL4 catheter was then  exchanged over a J wire for a JR4 catheter.  JR4 catheter was inserted  into the RCA without difficulty.  View of the RCA catheter were then  obtained in the RAO cranial, LAO cranial views.  The JR4 catheter was  then removed over a J wire and the procedure was complete.   FINDINGS:  1. Pressures:      a.     PW 12/9, mean of 7.      b.     PA pressure 29/10, mean of 19.      c.     RV pressure [id number], and end diastolic 6.      d.     LV pressure [id number], end diastolic pressure 15.      e.     Aortic pullback pressure 121/73/93.      f.     Aortic pressure 125/75/76/96.      g.     LV pressure 125/9/12.   CARDIAC OUTPUTS:  By thermo was 5.1 L per minute and by Fick 6.03 L per  minute.  Cardiac index 2.47 L/m2.  Cardiac index by Fick was 2.9  L/min/m2.  PA sat was 65% and FA sat was 95%.   LEFT VENTRICULOGRAPHY:  Shows a normal ejection fraction with an EF of  65% with no evidence of mitral regurgitation.   CORONARIES:  1. The right coronary artery was a large dominant vessel giving rise      to a PDA and its posterolateral branch.  There was no evidence of      any significant atherosclerotic disease in the right coronary      artery.  2. Left main.  The left main coronary artery was short but did      bifurcate into a left anterior descending and left circumflex.  The      left main was free of any disease.  3. The left circumflex coronary artery was a large vessel coursing to      the AV groove, giving rise to 2 obtuse marginal branches.  The left      circumflex system had no significant disease.  The first and second  OMs were large vessels with no significant disease.  4. Left  anterior descending artery was a large vessel which coursed      through the apex with two diagonal branches.  The LAD was noted to      have a 50% narrowing after the takeoff of the second diagonal.  The      first diagonal was a medium sized vessel with marked disease      distally and had a 50% narrowing.  The second diagonal was a medium      sized vessel with a 60% ostial narrowing.   CONCLUSIONS:  1. Normal left ventricular function.  2. Normal right heart pressures.  3. Normal cardiac output and cardiac indices.  4. Mid left anterior descending artery 50% lesion.  5. Ostial diagonal-1 50%.  6. Ostial diagonal-2 60%.   PLAN:  1. Medical therapy.  2. She will return home in the morning.  3. Follow up with Dr. Nanine Means. Rollene Fare for further evaluation.      Tery Sanfilippo, MD  Electronically Signed     HS/MEDQ  D:  04/26/2007  T:  04/26/2007  Job:  [id number]

## 2010-06-25 NOTE — Discharge Summary (Signed)
NAME:  [name], [name]             ACCOUNT NO.:  [id number]   MEDICAL RECORD NO.:  [id number]          PATIENT TYPE:  INP   LOCATION:  3736                         FACILITY:  [facility]   PHYSICIAN:  Richard A. Rollene Fare, M.D.DATE OF BIRTH:  1946/01/21   DATE OF ADMISSION:  04/24/2007  DATE OF DISCHARGE:  04/27/2007                               DISCHARGE SUMMARY   DISCHARGE DIAGNOSES:  1. Chest pain consistent with unstable angina, catheterization showing      mild to moderate coronary disease, to be treated medically.  2. Dyslipidemia with an LDL of 190, Crestor resumed this admission.  3. Nonischemic cardiomyopathy with biventricular pacer in September      2008 with current ejection fraction of 65%.  4. Normal coronaries in 2004.  5. Obesity and sleep apnea, intolerant to CPAP.  6. Gastroesophageal reflux and irritable bowel syndrome followed by      Dr. Delfin Edis.  7. Intravenous contrast allergy.   HOSPITAL COURSE:  [name] is a pleasant 65 year old female followed  by Dr. Rollene Fare and Dr. Darcus Austin.  She has been seen by Dr. Cristopher Peru in the past.  She had a pacemaker implanted in April 2001.  She  has a nonischemic cardiomyopathy and in September 2008, she was upgraded  to a BiV device.  She responded well clinically.  On the morning of  admission, April 24, 2007, she was at work at [employer]  and developed a substernal chest pain worrisome for unstable angina.  She was admitted through the emergency room and started on nitroglycerin  and Lovenox.  She was set up for diagnostic catheterization for April 26, 2007.  She does have a history of a contrast allergy and she was  premedicated with IV Solu-Medrol, Pepcid and Benadryl.  Catheterization  done by Dr. Elisabeth Cara showed 50% LAD and 60% diagonal narrowing and an EF  of 65%.  Plan is for medical therapy.  We stopped her Prilosec and put  her on Protonix.  We also resumed her Crestor 10 mg, she tolerated  this  in the past but stopped it for fear of side effects.  On the day of  discharge, she developed a rash on her face and we gave her 50 mg of  Benadryl.  She was better an hour or so later and we feel she can be  discharged.  She will get instructions take Benadryl 25 mg q.6h. p.r.n.  for rash.  This may be a delayed contrast reaction.  She knows to  contact us if this gets worse.   DISCHARGE MEDICATIONS:  1. Potassium 20 mEq twice a day.  2. Metoprolol 25 mg daily.  3. Diltiazem 120 mg a day.  4. Aldactone 12.5 mg twice a day.  5. Lasix 40 mg twice a day.  6. Diovan 160 mg twice a day.  7. Prilosec has been stopped and changed to Protonix 40 mg a day.  8. Aspirin 81 mg a day.  9. Symbicort and Proventil inhalers p.r.n.  10.Crestor 10 mg a day.  11.Benadryl 25 mg q.6h. p.r.n.   LABORATORY DATA:  White count 15,000, hemoglobin 11.9, hematocrit 36.7,  platelets 279.  Sodium 134, potassium 4.3, BUN 32, creatinine 1.2.  Liver functions were normal.  Cholesterol is 251 with an LDL of 190 and  HDL of 38, triglycerides 115.  CK-MB and troponins were negative x3.  Magnesium is 2.2.  TSH 2.56.   Chest x-ray showed  cardiac enlargement but no heart failure.   Urinalysis unremarkable.  INR 0.9.   EKG shows paced rhythm with bundle branch block.   DISPOSITION:  The patient is discharged in stable condition and will  follow up with Dr. Rollene Fare in a couple weeks in the office.  She will  take Benadryl p.r.n. for her rash and call us if this does not get  better.      Erlene Quan, P.A.      Richard A. Rollene Fare, M.D.  Electronically Signed    LKK/MEDQ  D:  04/27/2007  T:  04/27/2007  Job:  [id number]   cc:   Frann Rider, M.D.

## 2010-06-25 NOTE — Assessment & Plan Note (Signed)
[facility] OFFICE NOTE   NAME:JOHNSONTiaja, Ravitz                    MRN:          [id number]  DATE:08/03/2007                            DOB:          01-May-1945    HISTORY:  Ms. Stobaugh returned today for followup.  She is a very  pleasant, obese middle-aged with a history of ischemic cardiomyopathy,  congestive heart failure and complete heart block who is status post  upgrade to a biventricular ICD back in September 2008.  Her heart  failure improved markedly going from class III to almost class I/class  II.  She has also lost weight.  She fell recently and following this was  noted by Dr. Rollene Fare to have elevation in her RV pacing thresholds.  She is now referred for additional evaluation.  Previous RV pacing  threshold back in January was 1 at 0.5 and now it is 6 at 1.  At times,  she has been unable to capture in the right ventricle.  The patient has  complete heart block and is very pacemaker dependent.  She returns today  for followup.  She has had no additional symptoms.  She denies other  complaints.   MEDICATIONS:  1. Toprol XL 25 a day.  2. Zyrtec 10 a day.  3. Diovan 300 a day.  4. Diltiazem 120 a day.  5. Lasix 60 a day.  6. Aldactone 25 a day.  7. Potassium 20 twice daily.   PHYSICAL EXAMINATION:  GENERAL:  She is a pleasant middle-aged woman in  no acute distress.  VITAL SIGNS:  Blood pressure was 140/92, pulse was 100 and regular,  respirations were 18, weight was 224 pounds.  NECK:  Revealed no jugular venous distention.  LUNGS:  Clear bilaterally to auscultation.  No wheezes, rales or rhonchi  are present.  CARDIAC:  Regular rate and rhythm.  Normal S1-S2.  ABDOMINAL:  Soft, nontender, nondistended.  There is no organomegaly.  EXTREMITIES:  Demonstrated no edema.   IMPRESSION:  1. Implantable cardioverter-defibrillator lead dysfunction with      markedly elevated pacing  thresholds.  2. Complete heart block.  3. Ischemic cardiomyopathy.  4. Congestive heart failure.   DISCUSSION:  I discussed treatment options with the patient in detail.  She had a previously working RV lead and my plan would be to have her  come back to the hospital where we would disconnect the rate sense  portion of the defibrillator lead and use the old RV pacing lead for  defibrillator rate sensing.  If by some chance the old right sensing  lead for her pacemaker is not working satisfactorily or if the patient's  defibrillator lead has developed a high defibrillation threshold, then  removal of the defibrillation lead would be required and insertion of a  new lead would be recommended.  I have discussed these issues with the  patient and this will be scheduled the earliest possible convenient  time.     Champ Mungo. Lovena Le, MD  Electronically Signed    GWT/MedQ  DD: 08/03/2007  DT: 08/04/2007  Job #: [id number]  cc:   Richard A. Rollene Fare, M.D.

## 2010-06-25 NOTE — H&P (Signed)
NAME:  SEVYN, MERRIETT NO.:  [id number]   MEDICAL RECORD NO.:  [id number]          PATIENT TYPE:  INP   LOCATION:  2028                         FACILITY:  [facility]   PHYSICIAN:  Shelva Majestic, M.D.     DATE OF BIRTH:  [birth year]   DATE OF ADMISSION:  10/29/2006  DATE OF DISCHARGE:                              HISTORY & PHYSICAL   HISTORY OF PRESENT ILLNESS:  Ms. Oral Robling is a 65 year old,  white, female patient of Dr. Terance Ice who came to the emergency  room with complaints of chest pain and shortness of breath. She has a  prior medical history of nonischemic cardiomyopathy, asthma, coronary  artery disease by catheterization in 2004, with last Cardiolite on  February 19, 2004, that showed no ischemia.  She also has hyperlipidemia,  hypertension and a permanent pacemaker for which she had a generator  change performed in January 2008.  She states that she was recently seen  by Dr. Rollene Fare at the end of August.  He thought she may be fluid  overloaded.  Her Lasix was increased.  Over the weekend, she had  increased wheezing and shortness of breath.  She was seen by her  pulmonary doctor and started on prednisone on Tuesday.  Today, she was  at work and had difficulty with increased wheezing, shortness of breath  and then she had some chest tightness midsternal without radiation or  diaphoresis.  She came to the emergency room.   PAST MEDICAL HISTORY:  1. Obstructive sleep apnea, but unable to wear CPAP.  2. PT/VDP.  She has a Guidant Insignia Plus initially implanted      secondary to bradycardia and 2 to 1 block.  3. Hypertension.  4. Nonischemic cardiomyopathy with an EF of 25-35% by echocardiogram      in 2007.  5. Cardiac catheterization in 2004, with insignificant coronary      disease.  6. Cardiolite on February 19, 2004, showed no ischemia.  7. Hyperlipidemia, but refuses to take statins because her sister-in-      law died of cholesterol  medication, she states.  8. Asthma.  9. Generator changed on February 16, 2006.  10.History of cervical spine surgery.   MEDICATIONS:  1. Diltiazem 120 mg every day.  2. Diovan 160 mg every day.  3. Singulair 10 mg every day.  4. Symbicort b.i.d.  5. Potassium 20 mEq b.i.d.  6. Prednisone.  She does not know the dose.  7. Lasix 40 mg b.i.d.  8. She states she was on metoprolol or Toprol and stopped taking this.      She does not remember when.  Per our office records, we still had      her on it in August.   ALLERGIES:  Novi.   FAMILY HISTORY:  Grandmother had MI in her 48s.  Mother had coronary  disease.  Father did not.  Her other grandmother also had coronary  disease.  She has two brothers and one sister, none of whom have  coronary disease.   SOCIAL HISTORY:  She is married and has 5  children and 9 grandchildren.  She does not smoke.  She works at Secondary school teacher at [employer].   REVIEW OF SYSTEMS:  She denies any fever, chills or congestion.  No  headaches.  No changed visual disturbances.  No rash, no lesions.  She  does have chest pain with shortness of breath, dyspnea on exertion and  wheezing as stated above.  No palpitations, no frequency, no weakness,  no depression, no anxiety.  She does have some arthralgias.  No nausea,  vomiting.  She has had increased GERD symptoms.  Other systems are  negative.   PHYSICAL EXAMINATION:  VITAL SIGNS:  Blood pressure 164/98, pulse of  110, temperature 97, respirations 28.  GENERAL:  She is in no acute distress.  HEENT:  She is normocephalic.  Pupils equal and round.  Sclera is clear.  NECK:  Supple without any adenopathy.  She does have a thick neck.  CARDIOVASCULAR:  Heart sounds are regular with 1/6 systolic ejection  murmur, S1 and S2.  LUNGS:  Decreased breath sounds at her bases.  No wheezes.  SKIN:  No rash.  ABDOMEN:  Obese.  No organomegaly or hepatomegaly could be detected.  EXTREMITIES:  She has 2+  lower extremity edema.  Moves all extremities  x4.  NEUROLOGIC:  Alert and oriented x3.  Facial asymmetry is equal.   LABORATORY DATA AND X-RAY FINDINGS:  Chest x-ray showed some vascular  congestion.  EKG was V-pacing.   Hemoglobin 13.9, hematocrit 41, platelets 242, WBC 9.9.  Sodium 144,  potassium 3.7, BUN 26, creatinine 1, glucose 151.  The first point of  care marker was negative.   ASSESSMENT:  1. Chest pain/shortness of breath.  2. Congestive heart failure, acute on chronic.  3. Nonischemic cardiomyopathy, ejection fraction of about 30%.  4. Significant asthma with recent aspiration started on prednisone.  5. Hypertension.  6. Obstructive sleep apnea, refuses continuous positive airway      pressure.  7. Hyperlipidemia, refuses cholesterol medication.  She tells me she      takes fish oil.   PLAN:  We will admit and given her IV Lasix.  Check her enzymes.  Further evaluation depends on lab tests.  She was seen by Dr. Claiborne Billings who  made the recommendations that he wanted to start her back on metoprolol  12.5 mg b.i.d.  We will also increase her Diovan to 60 mg b.i.d. because of her blood  pressure.  He felt we should consider aldosterone treatment for her  cardiomyopathy and also thought that Bi-V ICD should be rethought.  We  will also hold the prednisone since she has no wheezing.      Cyndia Bent, N.P.    ______________________________  Shelva Majestic, M.D.    BB/MEDQ  D:  10/29/2006  T:  10/30/2006  Job:  [id number]   cc:   Dwaine Deter, M.D.  Glory Buff, M.D. [facility]

## 2010-06-25 NOTE — Op Note (Signed)
NAMEQUINCY, GALLAWAY             ACCOUNT NO.:  [id number]   MEDICAL RECORD NO.:  [id number]          PATIENT TYPE:  INP   LOCATION:  2028                         FACILITY:  [facility]   PHYSICIAN:  Champ Mungo. Lovena Le, MD    DATE OF BIRTH:  Nov 19, 1945   DATE OF PROCEDURE:  11/04/2006  DATE OF DISCHARGE:                               OPERATIVE REPORT   PROCEDURE PERFORMED:  Device testing following implantation of a  biventricular ICD colon.   INTRODUCTION:  The patient is a 65 year old woman with a history of  longstanding complete heart block and dilated cardiomyopathy with class  III heart failure.  She had pacing reduced left bundle branch block with  a QRS restoration of over 200 milliseconds and underwent BiV ICD upgrade  yesterday.  The procedure was quite long and because of the difficulty  with sedation at that time, it was deemed not appropriate to test her  device and she is now brought back for defibrillation threshold testing.   DESCRIPTION OF PROCEDURE:  After informed consent was obtained, the  patient was prepped in the  usual manner.  She was sedated with fentanyl  and Versed.  VF was induced with T-wave shock and a 14 joule shock was  delivered which terminated VF and restored sinus rhythm.  The shock  impedance was 43 ohms, the duration of the event was 6 seconds.  The  patient tolerated the procedure well.  She was allowed to awaken on her  own and returned to her room in satisfactory condition.   COMPLICATIONS:  There were no immediate procedure complications.   RESULTS:  This demonstrated successful defibrillation threshold testing  in a patient who is status post biventricular ICD implantation.      Champ Mungo. Lovena Le, MD  Electronically Signed     GWT/MEDQ  D:  11/04/2006  T:  11/04/2006  Job:  [id number]   cc:   Delfino Lovett A. Rollene Fare, M.D.  Shelva Majestic, M.D.

## 2010-06-25 NOTE — Op Note (Signed)
[name], [name]             ACCOUNT NO.:  [id number]   MEDICAL RECORD NO.:  [id number]          PATIENT TYPE:  INP   LOCATION:  2028                         FACILITY:  [facility]   PHYSICIAN:  Champ Mungo. Lovena Le, MD    DATE OF BIRTH:  04/19/1945   DATE OF PROCEDURE:  11/03/2006  DATE OF DISCHARGE:                               OPERATIVE REPORT   PROCEDURE PERFORMED:  Implantation of a biventricular ICD with removal  of previous implanted dual-chamber pacemaker utilizing the old right  atrial lead and new defibrillator and left ventricular leads.   INTRODUCTION:  The patient is a 65 year old woman with a history of  nonischemic cardiomyopathy and congestive heart failure class III, QRS  duration over 200 milliseconds secondary to underlying complete heart  block with RV apical pacing.  Her heart failure has gradually worsened  over the last several months and she was hospital with worsening heart  failure and elevation of her BNP.  She is now referred for upgrade to a  biventricular device as her medications have been maximized.   PROCEDURE:  After informed consent was obtained, the patient was taken  to diagnostic EP lab in fasting state.  Initial venogram was carried out  to document that the right subclavian vein was in fact patent.  30 mL of  lidocaine was infiltrated over the right infraclavicular region and 9 cm  incision was carried out over this region.  Electrocautery utilized to  dissect down to the fascial plane.  Care was taken not to enter the  pacemaker pocket.  The subclavian vein was punctured x2 with the [employer]ner, model 7120 60-cm active fixation pacing lead serial number  [id number] being advanced to the right ventricle.  At the final site R-  waves measured 21 mV, pacing impedance was 894 ohms and threshold 0.9  volts at 0.5 milliseconds.  10 volts pacing not stimulate the diaphragm.  With the ventricular lead in satisfactory position, attention then  turned  placement of the LV lead.  The coronary sinus guiding catheter  was advanced into the right atrium along with a 6-French hexapolar EP  catheter.  Unfortunately, cannulation of the coronary sinus was made  quite difficult based on the patient's tortuous anatomy and very unusual  take off of her coronary sinus.  After approximately one hour of  attempts, the coronary sinus was intact cannulated and venography  carried out.  Her lateral veins, high lateral vein and posterolateral  vein were very small.  Initially attempts to place the lead in the  lateral vein were carried out.  It had a shepherd's crook unfortunately  and despite a guidewire being able to be advanced, along with __________  catheter being advanced, the LV unipolar lead could not be advanced into  this lateral vein.  This it took over an hour to try to accomplish.  At  this point the posterior vein was cannulated.  Venography carried out.  This was a nice large vein and the [employer] steerable  defibrillator lead serial number [id number] was advanced utilizing a Mailman  guidewire into the  vein.  At the final site approximately 3/4 the  distance from base to apex out along the posterior wall at approximately  4 o'clock on the mitral annulus the LV waves were 13 mV and threshold  was slightly elevated at 3 volts unipolar.  The pacing impedances were  somewhat higher than expected at 1400 ohms.  10 volts pacing did  stimulate the diaphragm, but 6 volt pacing did not and because of no  other options for LV lead placement, this site was accepted and at this  point the LV and defibrillator lead was secured to subpectoralis fascia  with a figure-of-eight silk suture and then the sewing sleeves were also  secured with silk suture.  Next kanamycin irrigation was utilized to  irrigate the initial pocket and then electrocautery was utilized to open  the previously implanted Guidant pacing generator and the device was   removed without difficulty.  The old RV lead was capped.  A new right  atrial lead was then connected to the [employer] __________ bi-V  ICD serial number [id number] and after this the new LV and new  defibrillation leads were also connected to the defibrillator.  At this  point of course the pocket was revised to accommodate the larger device  and again the pocket was irrigated and the incision closed with layer of  2-0 Vicryl followed by layer of 3-0 Vicryl.  Benzoin was painted on the  skin and Steri-Strips were applied and pressures was placed.  The  patient was returned to her room in satisfactory condition.   COMPLICATIONS:  There were no immediate procedure complications.   RESULTS:  This demonstrates successful implantation of a [employer] biventricular ICD in a patient with previously implanted dual-  chamber pacemaker secondary to complete heart block.  The patient  tolerated the procedure very nicely.  Defibrillation threshold testing  will be carried out at a later date.      Champ Mungo. Lovena Le, MD  Electronically Signed     GWT/MEDQ  D:  11/03/2006  T:  11/04/2006  Job:  [id number]   cc:   Delfino Lovett A. Rollene Fare, M.D.  Shelva Majestic, M.D.

## 2010-06-25 NOTE — H&P (Signed)
NAME:  [name], [name]             ACCOUNT NO.:  [id number]   MEDICAL RECORD NO.:  [id number]          PATIENT TYPE:  EMS   LOCATION:  MAJO                         FACILITY:  [facility]   PHYSICIAN:  Bryson Dames, M.D.DATE OF BIRTH:  [birth year]   DATE OF ADMISSION:  04/24/2007  DATE OF DISCHARGE:                              HISTORY & PHYSICAL   CHIEF COMPLAINT:  Substernal chest pain and left arm pain.   HISTORY OF PRESENT ILLNESS:  [name] is a pleasant 65 year old  female followed by Dr. Terance Ice and Dr. Darcus Austin and Dr.  Cristopher Peru.  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  She  has a history of heart block and had a pacemaker implanted in April of  2001.  She does have nonischemic cardiomyopathy.  In September of 2008  she was upgraded to a BiV device by Dr. Cristopher Peru.  As an interesting  aside she says ever since her pacemaker was upgraded to a BiV her asthma  has gone away.  Echocardiogram done in September showed her EF to be 20-  25%.  Clinically Dr. Rollene Fare felt she was a responder when he saw her  in the office in October.  She has done quite well until this morning  when she developed a midsternal chest pain with radiation to her left  arm while at work.  The onset of her symptoms were about 9 a.m.  It was  associated with some mild shortness of breath but no nausea, vomiting or  diaphoresis.  She is now seen in the emergency room at 1 p.m.  Currently  she is symptom-free.  She has had a catheterization in 2004 that showed  no significant coronary disease.   CURRENT MEDICATIONS:  1. Her current medications as best we can tell are potassium 20 mEq      b.i.d.  2. Metoprolol 25 mg a day.  3. Diltiazem 120 mg a day.  4. Aldactone 12.5 mg b.i.d.  5. Lasix 40 mg b.i.d.  6. Diovan 160 b.i.d.  7. Prilosec 20 mg b.i.d.  8. Aspirin 81 mg a day.  9. Pulmicort inhaler daily.   ALLERGIES:  She is allergic to SULFA and IV CONTRAST.  She had a  rash  from IV contrast in the past.   PAST MEDICAL HISTORY:  Her past medical history is remarkable as noted  above, she has a history of asthma.  She is morbidly obese and has sleep  apnea but cannot tolerate CPAP.  She has a chronic right bundle branch  block.  She has had two knee surgeries and had neck surgery by Dr. Rodell Perna in October of 2007 after a fall.   SOCIAL HISTORY:  She is married.  She has 5 children and 7  grandchildren.  She does not smoke.   FAMILY HISTORY:  Is unremarkable for early coronary disease.  Mother  died at 74 of cancer.  She has 2 brothers and a sister.   REVIEW OF SYSTEMS:  Essentially unremarkable except for as noted above.  She has had gastroesophageal reflux and  irritable bowel syndrome and has  seen Dr. Delfin Edis.  She has not had recent problems or recent melena.  She has never been transfused.   PHYSICAL EXAM:  VITAL SIGNS:  Today in the emergency room blood pressure  128/85, pulse 90, temperature 96.2, O2 sat is 96% on room air.  GENERAL:  She is a well-developed, morbidly obese female in no acute  distress.  HEENT:  Normocephalic, atraumatic.  Extraocular movements are intact.  Sclerae nonicteric.  Lids and conjunctivae within normal limits.  NECK:  Without JVD or bruit.  Thyroid is not enlarged.  CHEST:  Reveals clear lung fields bilaterally.  CARDIAC:  Reveals regular rate and rhythm without obvious murmur, rub or  gallop.  ABDOMEN:  Abdomen is morbidly obese, nontender.  EXTREMITIES:  Without edema.  Distal pulses are 2-3+/4 bilaterally.  NEUROLOGICAL:  Exam is grossly intact.  She is awake, alert and  oriented, cooperative.  Moves all extremities without obvious deficit.  SKIN:  Skin is warm and dry.   EKG is paced.   LABS:  Are pending.   IMPRESSION:  1. Chest pain consistent with unstable angina.  2. History of nonischemic cardiomyopathy, clinically she is a      responder after a BiV upgrade September of 2008.  3. No  significant coronary artery disease at catheterization in 2004.  4. Morbid obesity and sleep apnea, intolerant to CPAP.  5. Treated hypertension.  6. Gastroesophageal reflux and irritable bowel syndrome, seen in the      past by Dr. Delfin Edis.  7. IV contrast allergy.   PLAN:  The patient was seen by Dr. Melvern Banker and myself in the emergency  room.  She will be admitted and started on Lovenox and IV nitrates and  ruled out for an MI.  She may need a repeat catheterization.      Erlene Quan, P.A.    ______________________________  Bryson Dames, M.D.    Meryl Dare  D:  04/24/2007  T:  04/24/2007  Job:  [id number]

## 2010-06-25 NOTE — Op Note (Signed)
NAME:  [name], [name]             ACCOUNT NO.:  [id number]   MEDICAL RECORD NO.:  [id number]          PATIENT TYPE:  AMB   LOCATION:  SDS                          FACILITY:  [facility]   PHYSICIAN:  Sheral Apley. Weingold, M.D.DATE OF BIRTH:  09-24-1945   DATE OF PROCEDURE:  10/25/2007  DATE OF DISCHARGE:  10/25/2007                               OPERATIVE REPORT   PREOPERATIVE DIAGNOSIS:  Right fifth digit middle phalangeal fracture of  the proximal interphalangeal joint.   POSTOPERATIVE DIAGNOSIS:  Right fifth digit middle phalangeal fracture  of the proximal interphalangeal joint.   PROCEDURE:  Hardware removal and extensor tenolysis as above.   SURGEON:  Sheral Apley. Burney Gauze, MD   ASSISTANT:  None.   ANESTHESIA:  General.   TOURNIQUET TIME:  38 minutes.   COMPLICATIONS:  None.   DRAINS:  None.   OPERATIVE REPORT:  The patient was taken to the operating suite.  After  induction of adequate general anesthesia, the right upper extremity was  prepped and raped in the sterile fashion.  An Esmarch was used to  exsanguinate the limb.  Tourniquet was inflated to 275 mmHg.  At this  point in time, an incision was made using the old previous surgical scar  and large radial-based flap was elevated.  Once this was done, the  extensor mechanism was split longitudinally from the PIP joint down  toward the middle of the middle phalanx.  Once this was done, the plate  was encountered.  It was carefully removed using the modular handset  screw driver with 1.3 screws.  Once this was achieved, the wound was  thoroughly irrigated.  Intraoperative fluoroscopy revealed removal of  hardware.  The extensor mechanism was repaired using inverted 4-0  Mersilene sutures followed by 4-0 Monocryl suture on the skin  subcuticularly.  Steri-Strips, 4 x 4s fluffs, and an ulnar gutter splint  was applied.  This was done prior to wound closure and extensor  tenolysis was performed using tenotomy scissors and a  Soil scientist.  The patient was then placed again in a sterile dressing, Steri-Strips, 4  x 4 fluffs, and ulnar gutter splint.  The patient tolerated the  procedure well, went to recovery room in stable fashion.      Sheral Apley Burney Gauze, M.D.  Electronically Signed    MAW/MEDQ  D:  10/25/2007  T:  10/26/2007  Job:  [id number]

## 2010-06-25 NOTE — Discharge Summary (Signed)
[name], [name]             ACCOUNT NO.:  [id number]   MEDICAL RECORD NO.:  [id number]          PATIENT TYPE:  INP   LOCATION:  2028                         FACILITY:  [facility]   PHYSICIAN:  Richard A. Rollene Fare, M.D.DATE OF BIRTH:  [birth year]   DATE OF ADMISSION:  10/29/2006  DATE OF DISCHARGE:  11/04/2006                               DISCHARGE SUMMARY   DISCHARGE DIAGNOSES:  1. Acute-on-chronic systolic heart failure, resolved.  2. Chronic systolic heart failure, class III.  3. Chest pain, negative for myocardial infarction, with nonobstructive      coronary disease on heart catheterization in 2004 and negative      Cardiolite 2006.  4. Nonischemic dilated cardiomyopathy with EF 25-35%. BiV ICD upgrade      of her permanent pacemaker by Dr. Lovena Le.  5. History of complete heart block with permanent transvenous      pacemaker, now upgraded.  6. Left bundle branch block, pacer induced.  7. Obstructive sleep apnea.  8. Asthma/chronic obstructive pulmonary disease.  9. Hypertension.  10.Hyperlipidemia, refusing statins.  11.Abnormal D-dimer, negative pulmonary embolus on CT scans.  12.Morbid obesity.   DISCHARGE CONDITION:  Improved.   PROCEDURE:  November 03, 2006, upgrade of permanent transvenous  pacemaker to a BiV ICD, [place] __________  those are leads.  The ICD generator is actually [place].   November 04, 2006, DFT testing.   DISCHARGE MEDICATIONS:  1. Keflex 500 mg 1 tablet 1/2 hour before breakfast, lunch and dinner      and bedtime for 5 days.  2. Beconase AQ or Nasonex 50 mcg nasal spray 1 spray each nostril      twice a day as needed.  3. Alprazolam 0.25 one twice a day if needed for anxiety.  4. Lanoxin 0.125 mg daily.  5. Toprol-XL 1/2 a tablet daily 12.5 mg.  6. Diltiazem CD 120 mg daily.  7. Singulair 10 mg daily.  8. K-Dur 20 mEq 1 twice a day.  9. Aspirin 81 mg daily.  10.Diovan 160 mg 1 twice a day.  11.Cymbalta  80/4.5 mcg inhaler 1 puff twice a day.  12.Lasix 40 mg 1-1/2 in the morning and 1 in the evening to equal 60      mg in the morning and 40 in the evening.  13.Omeprazole 20 mg twice a day.  14.Spironolactone 25 mg 1/2 tablet twice a day.  15.Stop taking prednisone.  16.Hold fish oil for now.   DISCHARGE INSTRUCTIONS:  1. Keep your pacer incision dry for the next 7 days. Sponge bathe      until Tuesday, September 30.  2. Follow up for your defibrillator at [name] on [facility] ICD clinic appointment Thursday October 9 at 9 a.m.  3. See Dr. Lovena Le Tuesday, January 6, at 11:40 a.m.  4. Have blood work done on Monday, November 09, 2006.  5. Diet - low sodium, heart healthy.  6. Increase activity slowly.  7. Keep incision dry. No shower or tub bath.  8. No lifting for 2 weeks.  9. No driving for 1  week.  10.Follow up with Dr. Rollene Fare October 14 at 4 p.m.  11.We have cancelled your 2-D echocardiogram and her Cardiolite study.      Dr. Rollene Fare will reschedule when he believes appropriate.   HISTORY OF PRESENT ILLNESS:  A 65 year old female patient of Dr.  Rollene Fare presented to the emergency room October 29, 2006 with chest  pain and shortness of breath and increased wheezing. She had seen Dr.  Rollene Fare recently and thought to be in fluid overload, and Lasix was  increased. The weekend prior to admission, she had increased wheezing  seen by pulmonary physicians and started on prednisone for 10 days. On  the day of admission, she tried to work but had increasing shortness of  breath, wheezes and chest pain midsternal without radiation or  diaphoresis. She came to the emergency room. She was given IV Lasix, IV  heparin. Her pain was improved in the emergency room. D-dimer was drawn  and was elevated as well.   PAST MEDICAL HISTORY:  Coronary disease with a heart catheterization in  2004 with nonobstructive coronary disease. EF has been 25-35%. She has   permanent pacemaker for a 2:1 heart block. She has obstructive sleep  apnea, and she has been unable to wear CPAP. Last Cardiolite was in  2006, negative for ischemia. She has hyperlipidemia but refused a  statin. She has had cervical spine surgery in the past, and she has  asthma.   FAMILY HISTORY, SOCIAL HISTORY, REVIEW OF SYSTEMS:  See HPI.   PHYSICAL EXAMINATION:  At discharge, blood pressure was 101/72, sinus  rhythm at 68, respirations 16.  On exam, she had no JVD.  LUNGS:  Were clear without rales.  HEART:  Regular rate and rhythm. No S3. At the ICD site, no ecchymosis,  and no lower extremity edema now.   LABORATORY DATA:  Hemoglobin initially was 11.7, hematocrit 37.1, WBC  9.9, MCV 77.6, neutrophils 90. Hemoglobin has essentially stayed the  same; at discharge, it is 12.8 and hematocrit 40.4, WBC 10, platelets  255, MCV 76.4. Chemistry on initial labs:  Sodium 142, potassium 3.8,  chloride 108, CO2 28, BUN 24, creatinine 0.87, glucose 122. Potassium  has dropped at times; it was replaced, and at discharge, sodium 138,  potassium 4.2, chloride 99, CO2 33, BUN 29, creatinine 1.15, glucose  102.   Coags on admission:  Pro time 13.4, INR of 1, and PTT 28. On heparin,  she was therapeutic. AST was 31, ALT 50, alkaline phosphatase 100, total  bilirubin 0.6, albumin 3.3.   Cardiac enzymes:  CKs ranged 168 with MB of 4.2, 149 and MB of 4.1, and  CK of 115, MB of 3.3. Troponin I 0.03, 0.02.   Total cholesterol 236, LDL 158, HDL 64, triglycerides 70. Calcium ranged  9 to 8.6, magnesium 2 to 2.3. Urinalysis had a small amount of leukocyte  esterase, rare bacteria. TSH 0.772. D-dimer was 1.43. BNP on admission  was 1111, and followup BNP on the 23rd was 200.   RADIOLOGY:  Original chest x-ray:  Low volume chest film with vascular  crowding and atelectasis. Also underlying basilar scarring changes, mild  stable cardiac enlargement, peribronchial thickening and increased   interstitial markers, right lung greater than left. CT of her chest was  done secondary to her elevated D-dimer. She had no CT evidence for  pulmonary emboli. Atherosclerotic change involving the aorta but no  obvious dissection. Patchy lower lobe glass opacity suggesting a partial  air space filling  process such as mild edema or possible inflammatory or  infectious alveolitis. No focal pneumonia. No pleural effusions. No  __________ .   On September 24, her post-ICD chest x-ray:  Right ICD lead is described  above without pneumothorax, decreased atelectasis.   EKG:  On admission, left axis deviation, heart rate was 134, ventricular  pacing. Followup on the 23rd, she is sinus rhythm with ventricular  pacing.   HOSPITAL COURSE:  Ms. Scantling was admitted October 29, 2006 by Dr.  Claiborne Billings on call for Dr. Rollene Fare secondary to chest pain, shortness of  breath and acute-on-chronic systolic heart failure. She was diuresed and  placed on IV heparin. Her BNP was also elevated on admission, and she  was hypokalemia and given potassium supplement. Cardiac enzymes, CK-MB  was slightly elevated, but her troponins were all negative. She diuresed  well. Her Diovan was adjusted to increase, and spironolactone was added  to her medical regimen. CT of her chest was done because of an elevated  D-dimer which was negative for pulmonary embolus. Due to her continued  nonischemic dilated cardiomyopathy, she was examined at our request by  Dr. Caryl Comes and Dr. Lovena Le, and patient was upgraded on November 03, 2006  to a BiV ICD. She tolerated the procedure well, and by September 24, she  went back for DFT testing and did well there as well. She continued to  be stable, and it was felt by the afternoon of November 04, 2006, she  could discharged home. She will need antibiotics continued for five  days. She will follow with Dr. Rollene Fare as an outpatient. If she has  further problems, she will contact  us.      Otilio Carpen. Ingold, N.P.      Richard A. Rollene Fare, M.D.  Electronically Signed    LRI/MEDQ  D:  11/04/2006  T:  11/04/2006  Job:  [id number]   cc:   Delfino Lovett A. Rollene Fare, M.D.  Champ Mungo. Lovena Le, MD  Dwaine Deter, M.D.  Glory Buff, M.D. [facility]  Shelva Majestic, M.D.

## 2010-06-25 NOTE — Assessment & Plan Note (Signed)
[place] OFFICE NOTE   NAME:JOHNSONSharah, Dirkse                    MRN:          [id number]  DATE:12/11/2006                            DOB:          04-12-1945    Ms. Luyster is a very nice 65 year old white female who is an acute work-  in from Dr. Darcus Austin because of heme-positive stool and dropping  hemoglobin and hematocrit associated with epigastric pain.  I have known  Ms. Dockery from 2004 when she was evaluated for gastroesophageal reflux  disease and change in her bowel habits.  She underwent upper endoscopy  and colonoscopy with findings of diverticulosis of the left colon, colon  polyps, and internal hemorrhoids.  On upper endoscopy, she had an  essentially normal exam.  She has been initially on Protonix, but most  recently on Prilosec 20 mg twice a day because of the cost of the other  PPIs.  Ms. Austell has multiple other medical problems, asthmatic  bronchitis, and she has had defibrillator put in recently by Dr.  Tami Ribas.  She became constipated and developed some arthritic  complaints, for which she has been taking Aleve at least 2 to 4 a day,  in addition to aspirin 81 mg a day.   MEDICATIONS:  1. Diovan 60 mg p.o. b.i.d.  2. Aspirin 81 mg p.o. daily.  3. Potassium 20 mg b.i.d.  4. Toprol XL 25 mg 1/2 p.o. daily.  5. Spironolactone 25 mg p.o. daily.  6. Lasix 60 mg daily.  7. Diltiazem 120 mg p.o. daily.  8. Symbicort 2 sprays b.i.d.  9. Singulair 10 mg nightly.  10.Omeprazole 20 mg p.o. b.i.d.  11.Last Aleve was taken last night.   PHYSICAL EXAM:  Blood pressure 118/72, pulse 80, and weight 220 pounds.  The patient was in no distress.  Sclerae not icteric.  LUNGS:  Clear to auscultation.  COR:  With normal S1, normal S2.  She had a defibrillator palpated in  the right upper quadrant of the chest.  ABDOMEN:  Protuberant, obese, but nontender.  Apparently, she was quite  tender  last week when Dr. Inda Merlin examined her.  Lower abdomen was normal.  Bowel sounds were normoactive.  RECTAL:  Stool was yellow, strongly Hemoccult positive.   LABORATORY DATA:  From Dr. Inda Merlin' office showed her hemoglobin on  October 20 was 12.4, hematocrit 38.3, with a BUN of 44 and creatinine  1.8.  Subsequently, on October 27, a hemoglobin was down to 10.2 with a  hematocrit of 31.9, but yesterday her hemoglobin up to 11.1 with  hematocrit of 35 and an MCV of 78.  Her upper abdominal ultrasound was  essentially normal, except for fatty infiltration of the liver.   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  I suspect she may have NSAID-related ulcer, either  duodenal or gastric, or possibly erosive gastritis.  The abdominal pain  was caused by gastric lesions.  She had a colonoscopy in 2004, which was  a good quality exam in terms of prep.  She  would be due for next exam in  August 2009.   PLAN:  1. Stop Aleve.  I have talked to the patient and advised her not to      take any NSAID.  Instead, we have given her Darvocet-N 100 one      every 4 to 6 hours p.r.n. joint pain.  2. Instead of Prilosec, I gave her samples of Nexium 40 mg daily,      which she is going to take for the next week or 2.  3. Upper endoscopy scheduled for December 13, 2006 in the morning.  4. She may take Gaviscon or antacids p.r.n. for indigestion.  She will      be out of work today, but she is interested in going back to work      because she is afraid that she may lose her job.  We will most      likely recheck her blood count next week.     Lowella Bandy. Olevia Perches, MD  Electronically Signed    DMB/MedQ  DD: 12/11/2006  DT: 12/11/2006  Job #: [id number]   cc:   Frann Rider, M.D.

## 2010-06-25 NOTE — Assessment & Plan Note (Signed)
[facility] OFFICE NOTE   NAME:JOHNSONKeshia, Hoek                    MRN:          [id number]  DATE:11/19/2006                            DOB:          1945/08/02    [name] was seen today in the clinic on November 19, 2006, for a wound  check of her newly implanted [place] #H220 [place].  Date  of implant was November 03, 2006, for congestive heart failure.   On interrogation of her device today,  Her battery voltage is 3.18 with a charge time of 6.7 seconds.  P waves measured 4.8 millivolts with an atrial capture threshold of 0.6  volts at 0.5 milliseconds and an atrial lead impedance of 635 ohms.  Right ventricular R waves measured 8.5 millivolts with a right  ventricular pacing threshold of 2.4 volts at 0.5 milliseconds and 2.2  volts at 0.8 milliseconds.  Right ventricular lead impedance of 369  ohms.  Left ventricular R waves measuring 15.9 millivolts with a left  ventricular pacing threshold of 1.6 volts at 0.5 milliseconds and a left  ventricular lead impedance of 808 ohms.  There was 1 induced shock at the time of implant.  No other episodes  noted.   Steri-Strips were removed today.  Her wound is without redness or edema.  I did increase her RV output to 4 volts at 0.5 milliseconds and she is  going to follow up with Dr. [facility].      Alma Friendly, LPN  Electronically Signed      Champ Mungo. Lovena Le, MD  Electronically Signed   PO/MedQ  DD: 11/19/2006  DT: 11/19/2006  Job #: [id number]

## 2010-06-25 NOTE — Consult Note (Signed)
[name], [name]             ACCOUNT NO.:  [id number]   MEDICAL RECORD NO.:  [id number]          PATIENT TYPE:  INP   LOCATION:  2028                         FACILITY:  [facility]   PHYSICIAN:  Champ Mungo. Lovena Le, MD    DATE OF BIRTH:  Jun 21, 1945   DATE OF CONSULTATION:  11/02/2006  DATE OF DISCHARGE:                                 CONSULTATION   REFERRING PHYSICIAN:  Richard A. Rollene Fare, M.D./Thomas Claiborne Billings, M.D.   REASON FOR CONSULTATION:  Evaluate possibility of upgrade to a Bi-V ICD  in a patient with nonischemic cardiomyopathy and congestive heart  failure, EF 30%.   HISTORY OF PRESENT ILLNESS:  The patient is a very pleasant, 66-year-  old, obese woman with a history of hypertension nonischemic  cardiomyopathy and COPD.  The patient underwent permanent pacemaker  implantation in 2001.  She had her generator replaced in 2008.  She did  note problems with infection after her initial implant requiring  hospitalization with IV antibiotics, although I do not have details  about this.  The patient notes that over the last 3-4 months, she has  had increasing dyspnea with exertion, shortness of breath and volume  overload.  She has had her Lasix dose increased as an outpatient, but  despite this along with a trial of prednisone for her COPD, she presents  to the hospital with worsening congestive heart failure symptoms and a  BNP of over 1100.  She has been treated with aggressive IV diuresis with  her symptoms being improved.  The patient denies any history of syncope.   SOCIAL HISTORY:  The patient has a history of being married.  She has  five children.  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  She denies  tobacco use.  She denies alcohol use.   FAMILY HISTORY:  Notable for coronary disease.   PAST MEDICAL HISTORY:  1. As noted in the HPI.  2. History of dyslipidemia for which she has refused a statin in the      past.  3. She has a history of cervical spine surgery back in  October 2007.  4. History of hypertension.  5. Longstanding nonischemic cardiomyopathy.  6. History of obstructive sleep apnea and unable to wear CPAP.   REVIEW OF SYSTEMS:  As noted in the HPI.  She also has gastroesophageal  reflux disease and arthralgias.  She has a history of occasional  wheezing and conductive cough.  She has pleuritic type chest pain in the  past.  The rest of her review of systems was reviewed and found to be  negative except as noted above.   PHYSICAL EXAMINATION:  GENERAL:  She is a pleasant, morbidly, obese,  middle-aged woman in no acute distress.  VITAL SIGNS:  Blood pressure was 100/55, pulse 71 and regular,  respirations were 18, temperature is 98.2, oxygen saturation 95%.  HEENT:  Normocephalic, atraumatic.  Pupils equal and round.  Oropharynx  is moist.  Sclerae anicteric.  NECK:  No jugular distention.  There is no thyromegaly.  Trachea was  midline.  Carotids were 2+ and symmetric.  The  neck revealed no jugular  distention.  There is no thyromegaly.  Trachea is midline.  LUNGS:  Rales in the bases, otherwise no wheezes or rhonchi are present  and there is no increased work of breathing.  CARDIAC:  Distant with a regular rate and rhythm with normal S1 and S2.  I could not appreciate the PMI secondary to her obesity.  ABDOMEN:  Obese, nontender, nondistended.  There is no organomegaly.  Bowel sounds  present.  No rebound or guarding was noted.  EXTREMITIES:  Demonstrated no cyanosis, clubbing or edema.  Pulses were  2+ and symmetric.  NEUROLOGIC:  Alert and oriented x3.  Cranial nerves intact.  Strength  was 5/5 and symmetric.   EKG demonstrates sinus rhythm with AV sequential pacing and QRS duration  paced was over 200 msec.   IMPRESSION:  1. Nonischemic cardiomyopathy.  2. Severe congestive heart failure, presently Class IIIB.  3. Pacing-induced left bundle branch block likely secondary to      underlying complete heart block.  4. Chronic  obstructive pulmonary disease.  5. Morbid obesity.   RECOMMENDATIONS:  I have discussed treatment options with the patient.  The risks, benefits, goals and expectations of upgrade to a Bi-V ICD  have been discussed with the patient and she wishes to proceed.  This  will be scheduled as early as possible at a convenient time.      Champ Mungo. Lovena Le, MD  Electronically Signed     GWT/MEDQ  D:  11/02/2006  T:  11/02/2006  Job:  [id number]   cc:   Dwaine Deter, M.D.  Glory Buff, M.D. [facility]

## 2010-06-25 NOTE — Assessment & Plan Note (Signed)
[facility] OFFICE NOTE   NAME:JOHNSONChenin, Blaisdell                    MRN:          [id number]  DATE:02/16/2007                            DOB:          1946-01-03    HISTORY:  Ms. Fantroy returns today for followup.  She is a very  pleasant middle-aged woman with a history of complete heart block,  ischemic cardiomyopathy, congestive heart failure and is status post  upgrade to a BIV/ICD carried out back in September 2008.  She returns  today for followup.  Overall, her heart failure symptoms are much  improved going from class III to class I-II.  She had no specific  complaints today, although she does note that after a nice weight loss  of nearly 20 pounds, she has gained approximately 10 pounds of this  back.  All in all she feels well.  She does admit to some problems with  anxiousness and she states that her nerves sometimes are on edge.  She  has no peripheral edema.   PHYSICAL EXAMINATION:  GENERAL:  She is a pleasant, well-appearing  middle-aged woman in no acute distress.  VITAL SIGNS:  Blood pressure  today was 137/82, pulse was 79 and regular, respirations were 18, weight  was 220 pounds.  NECK:  Revealed no jugular venous distention.  LUNGS:  Clear bilaterally to auscultation.  No wheezes, rales or rhonchi  are present. CARDIOVASCULAR:  Regular rate and rhythm.  Normal S1-S2.  EXTREMITIES:  Demonstrated no cyanosis, clubbing or edema.   Her ICD insertion site was healed nicely.   Interrogation of her defibrillator demonstrates a [place] device with P and R waves of 3 and 6 respectively.  The impedances  were 619 in the atrium, 416 in the ventricle, 808 in the left ventricle,  threshold 0.6 to 0.4 in the right atrium, 1.8 to 0.8 in the right  ventricle and 1.2 to 1.4 in the left ventricle.  There are no  intercurrent ICD therapies.  Today, her current output is down to 2.4 at  0.5 rhythm in the LV.   IMPRESSION:  1. Ischemic cardiomyopathy.  2. Congestive heart failure.  3. Status post biventricular implantable cardioverter-defibrillator      insertion.  4. Complete heart block.   DISCUSSION:  Overall, Ms. Haus is stable.  Her heart failure is much  improved.  I have asked that she follow up with primary cardiologist,  Dr. Rollene Fare and I will see her back on a  p.r.n. basis.  I have asked that she maintain her medications and stay  on low-sodium diet.  I will see her back on an as needed basis only.     Champ Mungo. Lovena Le, MD  Electronically Signed    GWT/MedQ  DD: 02/16/2007  DT: 02/16/2007  Job #: [id number]   cc:   Delfino Lovett A. Rollene Fare, M.D.

## 2010-06-25 NOTE — Op Note (Signed)
[name], [name]             ACCOUNT NO.:  [id number]   MEDICAL RECORD NO.:  [id number]          PATIENT TYPE:  OIB   LOCATION:  2899                         FACILITY:  [facility]   PHYSICIAN:  Champ Mungo. Lovena Le, MD    DATE OF BIRTH:  July 31, 1945   DATE OF PROCEDURE:  08/09/2007  DATE OF DISCHARGE:  08/09/2007                               OPERATIVE REPORT   The patient is a very pleasant 65 year old woman with a complete heart  block and an ischemic cardiomyopathy status post MI and severe LV  dysfunction, EF 20%.  She has class III heart failure and underwent  upgrade from a dual-chamber pacemaker to a Bi-V ICD back in September  2008.  She had a nice response to Bi-V pacing with improvement in her  heart failure symptoms.  She fell several months ago and following this  was found to have elevation of her RV pacing threshold.  Interestingly,  enough the pacing thresholds have been quite variable since then at  times as high as 6 volts or 7 volts at 1 millisecond.  Today, at times  pacing threshold was 1.6 volts at 1 millisecond at other times it was 4-  1/2 volts at 1 millisecond, indicative that the defibrillation/pacing  lead was malfunctioning.  She is now referred for ICD lead revision.  The plan is to use her old pacemaker lead for rate sensing through her  defibrillator.   OPERATIVE PROCEDURE:  After informed consent was obtained, the patient  was taken to Diagnostic EP lab in fasting state.  Usual preparation and  draping, intravenous fentanyl and midazolam was given for sedation.  A  30 mL lidocaine was infiltrated in the old pacemaker/ICD insertion site.  A 7-cm incision was carried out down to the pectoralis fascia.  The ICD  pocket was entered with electrocautery without difficulty and removed  without difficulty.  There is no evidence of any effusion in the pocket.  The old pacemaker lead was uncapped and evaluated.  It demonstrated  paced R-waves of over 15 and a  threshold volt at 0.5 milliseconds.  With  these satisfactory parameters, the old defibrillator rate sense lead was  removed from the ICD and the old pacemaker lead was attached to the  defibrillator.  The old rate sense portion of the defibrillator lead was  capped.  At this point, the pocket was irrigated with kanamycin and the  device was placed back in the subcutaneous pocket.  Additional,  kanamycin was then utilized to irrigate the pocket.  The final  parameters demonstrated P-waves of 5.  There were no R-waves secondary  to complete heart block.  The pacing impedances were 679 in the atrium,  812 in the RV, and  1122 in the LV, threshold 0.6-0.5 in the RA, 10.5 in  the RV and 1.5 in the LV.  The patient was then sedated for  defibrillation threshold testing.   After the patient was deeply sedated with fentanyl and Versed, VF was  induced with T-wave shock and a 14 joules shock was delivered which  terminated VF and restored sinus rhythm.  At this point, no additional  defibrillation threshold testing was carried out ad the incision closed  with layer of 2-0 Vicryl followed by layer of 3-0 Vicryl.  Benzoin was  painted on the skin.  Steri-Strips were applied and a pressures was  placed.  The patient was returned to her room in satisfactory condition.   COMPLICATIONS:  There were no immediate procedure complications.   RESULTS:  This demonstrate successful switching, successful ICD lead  revision with utilization of a previously implanted RV pacing lead and  capping of the old rate sense portion of the defibrillation lead.  There  were no immediate procedure complications.  The defibrillation threshold  was 14 and there was no evidence of undersensing to the defibrillation  to the pacing lead.      Champ Mungo. Lovena Le, MD  Electronically Signed     GWT/MEDQ  D:  08/09/2007  T:  08/10/2007  Job:  [id number]   cc:   Delfino Lovett A. Rollene Fare, M.D.

## 2010-06-25 NOTE — Discharge Summary (Signed)
NAME:  [name], STEIDEL             ACCOUNT NO.:  [id number]   MEDICAL RECORD NO.:  [id number]          PATIENT TYPE:  INP   LOCATION:  4703                         FACILITY:  [facility]   PHYSICIAN:  Richard A. Rollene Fare, M.D.DATE OF BIRTH:  14-May-1945   DATE OF ADMISSION:  02/16/2008  DATE OF DISCHARGE:  02/19/2008                               DISCHARGE SUMMARY   DISCHARGE DIAGNOSES:  1. Chest pain, negative myocardial infarction.      a.     Noncritical coronary artery disease with 40% right coronary       artery and 40-50% left anterior descending stenosis and normal       left ventricular function.  2. History of nonischemic cardiomyopathy with biventricular      implantable cardioverter-defibrillator.  3. Elevated d-dimer, negative V/Q scan.  4. Shortness of breath, questionable secondary to recent upper      respiratory infection.  5. Hyperlipidemia.  6. Obesity.  7. Obstructive sleep apnea, unable to tolerate continuous positive      airway pressure.  8. Asthma.  9. Dyslipidemia.  10.Hypertension.  11.Gastroesophageal reflux disease.   DISCHARGE CONDITION:  Improved.   PROCEDURES:  February 18, 2008, combined left heart cath by Dr. Quay Burow, medical therapy, and no change from previous cath.   DISCHARGE MEDICATIONS:  1. K-Dur 20 mEq twice a day.  2. Toprol-XL 50 mg daily.  3. Diltiazem extended 120 mg daily.  4. Lasix 40 mg twice a day.  5. Divan 320 mg daily.  6. Zyrtec 10 mg daily.  7. Aspirin 81 mg daily.  8. Symbicort inhaler as needed.  9. Nasonex 1 spray each nostril daily.  10.Crestor 20 mg daily.  11.Protonix 40 mg daily.   DISCHARGE INSTRUCTIONS:  1. Low-sodium heart-healthy diet.  2. Increase activity slowly, may shower.  No lifting for 2 days.  No      driving for 2 days.  3. Wash cath site with soap and water.  Call us if any bleeding,      swelling, or drainage.  4. Follow up with Dr. Rollene Fare in 2 weeks.  The office will call with      date  and time.  5. You will be scheduled for a Lexiscan Myoview study.  The office      will call with date, time, and instructions.  6. Your glucose sugar in the hospital was up, see Dr. Darcus Austin      about diabetes.  Cut back on white bread, white potatoes, starches,      cakes, and cookies.   HISTORY OF PRESENT ILLNESS:  A 65 year old female patient presented to  the emergency room on February 16, 2008, with complaints of chest pain.  She was working and waiting tables, began to experience shortness of  breath and left anterior chest pressure with radiation down her left  arm, her back and her neck with choking tightness.  Symptoms decreased  with rest.  She had no nausea, vomiting, or diaphoresis.  She complained  of some increased fatigue over the last 2-3 days prior to admission as  well.  She also had palpitations in the last 2 days, but heart rate  never was elevated.  On arrival to emergency room, she was in a paced  rhythm.   PAST MEDICAL HISTORY:  Her EF has been 20-25%.  She has a BiV ICD,  [employer], history of complete heart block requiring permanent  pacemaker back in 2001, history of asthma, noncritical coronary disease  in the past, hypertension, dyslipidemia, obesity, obstructive sleep  apnea, unable to tolerate CPAP, irritable bowel syndrome,  gastroesophageal reflux disease, and orthopedic surgery.   The patient was admitted.  Her ICD was interrogated.  No episodes have  been recorded as abnormal heart beats.   By February 18, 2008, her D-dimer had been found to be positive.  She  underwent a V/Q scan, which was low probability.   LDL remained elevated at 185 on Crestor, but she does not like to take  it all the time.  She was made n.p.o. and underwent cardiac  catheterization as described on February 18, 2008.  By the next morning,  she was stable.  Dr. Gwenlyn Found saw her, and I examined her.  She had no  acute changes.   PHYSICAL EXAMINATION:  VITAL SIGNS:  Blood  pressure was 139/84.  EXTREMITIES:  Groin site was stable, and the patient ambulated.  She  will follow up as previously instructed.   LABORATORY VALUES:  Hemoglobin on admission 11.9, hematocrit 37.5, MCV  79.5, platelets 203.  WBC 8.3, neutrophils 67, lymph 25, mono 6, eos 2,  baso 1, protime 12.7, INR 0.9, PTT 32, and d-dimer was elevated at 1.09.   Chemistry, sodium 139, potassium 4.2, chloride 106, glucose 146, BUN 25,  creatinine 1.0, total protein 6.7, albumin 3.5, AST 24, ALT 25, alkaline  phosphatase __________ total bilirubin 0.6, magnesium 2.2.  Glycohemoglobin was 6.4.   Cardiac enzymes; CK range 156, 134 x2 and MBs were 2.3, troponin I 0.01,  all negative for MI.   BNP was 71, total cholesterol 252, triglycerides 123, HDL 42, and LDL  185.  TSH 4.013.  UA has moderate leukocyte esterase and few epithelial.  H. pylori was negative.   V/Q scan, normal ventilation study, no perfusion abnormalities  identified, low probability.  EKG, sinus rhythm with intraventricular  conduction delay, nonspecific ST-T changes.  Heart rate was 80.  The  patient was BiV pacing.   Chest x-ray on admission was negative.   HOSPITAL COURSE:  As described.      [name]. [name], N.P.      Richard A. Rollene Fare, M.D.  Electronically Signed    LRI/MEDQ  D:  03/27/2008  T:  03/28/2008  Job:  [id number]   cc:   Frann Rider, M.D.

## 2010-06-25 NOTE — Cardiovascular Report (Signed)
NAME:  Alicia, [name]             ACCOUNT NO.:  [id number]   MEDICAL RECORD NO.:  [id number]          PATIENT TYPE:  OBV   LOCATION:  36                         FACILITY:  [facility]   PHYSICIAN:  Quay Burow, M.D.   DATE OF BIRTH:  1945-04-10   DATE OF PROCEDURE:  DATE OF DISCHARGE:                            CARDIAC CATHETERIZATION   [name] is a 65 year old mildly overweight white female with history  of mild to moderate CAD by multiple cath in the past.  Her other  problems include history of remote nonischemic cardiomyopathy by the  ICD, upgrade September 2008; obesity with obstructive sleep apnea;  hypertension; dyslipidemia; and GERD.  She is admitted on February 16, 2008, with unstable angina.  Her ICD was interrogated and was normal.  She was cathed in March 2008, found to have mild to moderate disease in  her mid right and mid-LAD.  She presents now for diagnostic coronary  arteriography to define anatomy and rule out progression of disease and  ischemic etiology.   DESCRIPTION OF PROCEDURE:  The patient was brought to the second floor  [facility] in the postabsorptive state.  She was  premedicated with IV Benadryl, Solu-Medrol, and Pepcid for contrast  allergy prophylaxis.  Her right groin was prepped and shaved in the  usual sterile fashion.  A 1% Xylocaine was used for local anesthesia.  A  6-French sheath was inserted into the right femoral artery using  standard Seldinger technique.  A 6-French left Judkins diagnostic  catheter as well as a 6-French pigtail catheter were used for selective  cholangiography and left ventriculography respectively.  Visipaque dye  was used for the entirety of the case.  Retrograde aortic, left  ventricular, and pullback blood pressures were recorded.   HEMODYNAMICS:  1. Aortic systolic pressure [id number], diastolic pressure 75.  2. Left ventricular systolic pressure [id number] and diastolic pressure 8.   SELECTIVE CORONARY  ANGIOGRAPHY:  1. Left main normal.  2. LAD; the LAD had 50% smooth somewhat hypodense-appearing segmental      lesion in its mid portion after the second diagonal branch.  3. Left circumflex; this was codominant and free of significant      disease.  4. Right coronary artery; this was codominant with 40-50% diffuse      stenosis in the proximal and mid third of the vessel, unchanged      from prior angiogram 10 months ago.  5. Left ventriculography; RAO left ventriculogram was performed using      25 mL of Visipaque dye at 12 mL per second.  The LVEF was estimated      greater than 60% without focal wall motion abnormalities.   IMPRESSION:  [name] has moderate coronary artery disease in her  codominant right and mid left anterior descending, unchanged from the  prior catheterization 10 months ago.  She would benefit from having a  functional study as an outpatient.  At this point, I would recommend  continued medical therapy.   Sheath was removed and pressure was held in the groin to achieve  hemostasis.  The patient  left the lab in stable condition.      Quay Burow, M.D.  Electronically Signed     JB/MEDQ  D:  02/18/2008  T:  02/19/2008  Job:  [id number]   cc:   [facility]  Frann Rider, M.D.

## 2010-06-26 ENCOUNTER — Other Ambulatory Visit: Payer: Self-pay | Admitting: Nurse Practitioner

## 2010-06-28 NOTE — Op Note (Signed)
[place]. [facility]  Patient:    [name], [name]                    MRN: [id number] Proc. Date: 05/28/99 Adm. Date:  [id number] Attending:  Epifanio Lesches CC:         [facility]             Merlinda Frederick McDiarmid, M.D.             [facility] Lab                           Operative Report  PROCEDURE:  Implantation of permanent DDDR, A-V Universal pulse generator with passive fixation IS1 silicon tined, straight atrial and ventricular guidant electrodes.  SURGEON:  Richard A. Rollene Fare, M.D.  COMPLICATIONS:  None.  ESTIMATED BLOOD LOSS: Approximately 25 cc.  ANESTHESIA:  5 mg Valium p.o. premedication, intermittent Versed and Nubane in he lab for sedation (5 mg Versed, 8 mg Nubane total).  PREOPERATIVE DIAGNOSIS:  Recurrent presyncope with documented bradycardia and Mobitz type II AV block with underlying right bundle branch block.  Chest pain ith normal coronary arteries.  (May 17, 1999) normal LV.  Systemic hypertension. Exogenous obesity.  COPD, tobacco abuse.  Hyperlipidemia.  POSTOPERATIVE DIAGNOSIS:  Same as above.  PULSE GENERATOR:  Guidant Discovery II DR model [id number]; SN [id number].  VENTRICULAR ELECTRODE:  Guidant N593654; SN [id number].  ATRIAL ELECTRODE:  Guidant [id number]; SN [id number].  There was no retrograde - VA conduction at rates greater than 100 with ventricular pacing and atrial recording through the implant of electrodes.  There was retrograde Wenckebach at ventricular pacing rate of 90 to 100.  Good electrogram on atrial electrode and good electrogram unipolar and bipolar nd ventricular electrogram with good current of injury and slew rate with biphasic R wave.  THRESHOLDS:  Unipolar atrial:  P = 1.5 MV, minimal threshold 0.7 V, resistance 00 ohms.  Bipolar atrial:  P = 3.8 MV, minimal threshold 0.7 V, resistance 500 ohms.  Unipolar ventricular:  R = 4.7 MV, resistance 1080 ohms, minimal threshold 0.7 .  Ventricular  bipolar:  R = 7.0 MV, resistance 1080 ohms, minimal threshold 0.6 V.  DESCRIPTION OF PROCEDURE:  The patient was brought to the second floor CP Lab in a postabsorptive state after premedication with 5 mg of Valium p.o.  She was given intermittent Nubane and Versed for sedation during the procedure.  She is a large lady with exogenous obesity.  Her preoperative laboratory was normal and informed consent was obtained to proceed.  Platelet count was 241.  Renal function was normal and PTT normal.  She was prepped and draped in the usual fashion.  1% Xylocaine was used for local anesthesia.  A right infraclavicular curvilinear transverse incision was performed and brought down to the prepectoral fascia using blunt dissection and electrocautery controlled hemostasis.  A pulse generator pocket was performed using blunt dissection.  There was significant adipose tissue in this area, but we were able to get to the prepectoral fascia.  The subclavian vein was entered with a second anterior puncture using an 18 thin-wall needle after the artery was inadvertantly entered and the needle removed without adverse effects.  Two #10 peel-away Cook introducers were utilized to position atrial and ventricular electrodes in tandem.  The initial atrial electrode given to me was a unipolar nd this was done inadvertantly, so this had to be switched  to a bipolar atrial electrode.  Both electrodes were silicon tine passive fixation Guidant electrodes as outlined above.  Ventricular electrode was positioned in the RV apex and atrial electrode was positioned in the right atrial appendage.  Several different positions were obtained and finally a good stable position was obtained with adequate threshold. Threshold testing was performed, unipolar and bipolar.  There was no ventricular or esophageal pacing 10 volt output atrial or ventricular unipolar or bipolar on PSA testing.  The electrodes were  secured at the insertion site with a #1 figure-of-eight silk suture to prevent migration around the tissue collar to control hemostasis.  Therefore they secured with two #1 interrupted silk sutures around the silicon sewing collar for each electrode.  The sponge count was correct. The generator was hooked to the electrodes in the proper atrial and ventricular  sequence with the two sites for each electrode.  The generator was delivered into the pocket.  The electrodes looped behind, the loops were secured to the underlying muscle and fascia with a #1 silk suture to prevent migration.  Subcutaneous tissue was closed with two separate running layers of 2-0 Dexon suture and the skin was closed with 5-0 subcuticular Dexon sutures.  Steri-Strips were applied.  We good showed good position of the atrial and ventricular electrodes.  There was no pneumothorax.  The patient was transferred to the holding area for postoperative care and reprogramming.  Magnet rate of BOL equals 100 and at [facility] magnet rate dropped to 85 per minute.  DD:  05/28/99 TD:  05/28/99 Job: [id number]

## 2010-06-28 NOTE — Cardiovascular Report (Signed)
[place]. [facility]  Patient:    EMILI, ABDULRAHMAN                    MRN: [id number] Proc. Date: 05/17/99 Adm. Date:  [id number] Attending:  McDiarmid, Blane Ohara CC:         Merlinda Frederick McDiarmid, M.D.             Linda Hedges, M.D.             Neill Loft, Office                        Cardiac Catheterization  INDICATIONS:  Josi Humiston is a 65 year old white female who was admitted to [facility] yesterday by the teaching service.  Chief complaint was that of shortness of breath as well as left-sided chest pressure for the last several days.  The patient was also noted to be in Wenckebach second-degree Type 1 block and also has right bundle branch block.  She was seen by Dr. Radford Pax and is now scheduled for a diagnostic catheterization to evaluate potential ischemia and the etiology of her symptoms.  The patient may ultimately require a permanent pacemaker.  HEMODYNAMIC DATA:  Central aortic pressure was elevated at 215/90.  Left ventricular pressure 215/19.  ANGIOGRAPHIC DATA: 1. The left main coronary artery was angiographically normal and bifurcated    into an LAD and left circumflex system. 2. The LAD gave rise to two proximal diagonal vessels.  There was mild 20%    irregularity in the proximal portion of the first diagonal vessel.  The    remainder of the LAD and branches were free of significant disease. 3. The circumflex vessel was angiographically normal and gave rise to two    major marginal vessels and ended in a posterolateral-type vessel. 4. The right coronary artery was a moderate sized normal vessel that gave rise    to a large PDA vessel, then a small inferior LV and posterolateral branch.  Biplane cine left ventriculography revealed normal global LV function.  There was ectopy during the injection with ectopy-induced ______ MR.  On the LAO projection without ectopy, there was no MR.  Distal aortography was done because of  the patients significant hypertension. No renal artery stenosis was demonstrated.  There was no significant aortoiliac disease.  IMPRESSION: 1. Normal LV function. 2. No significant coronary obstructive disease with mild 20% luminal    irregularity of the first diagonal branch of the LAD. DD:  05/17/99 TD:  05/19/99 Job: [id number]

## 2010-06-28 NOTE — Procedures (Signed)
NAME:  [name], [name]             ACCOUNT NO.:  [id number]   MEDICAL RECORD NO.:  [id number]          PATIENT TYPE:  EMS   LOCATION:  ED                            FACILITY:  [facility]   PHYSICIAN:  Edward L. Luan Pulling, M.D.DATE OF BIRTH:  1945-12-15   DATE OF PROCEDURE:  DATE OF DISCHARGE:  02/07/2006                              EKG INTERPRETATION   TIME:  1732 on February 07, 2006.   The rhythm appears to be an AV disassociation.  In the chest leads,  there are what look like pacemaker deflections, which would suggest that  this is AV disassociation with a paced rhythm in the 50s.  The computer  read this as bifascicular block, right bundle branch block, and left  anterior hemiblock, and I suspect that is because what we are seeing is  a paced rhythm.   Abnormal electrocardiogram.      Jasper Loser. Luan Pulling, M.D.  Electronically Signed     ELH/MEDQ  D:  02/09/2006  T:  02/09/2006  Job:  [id number]

## 2010-06-28 NOTE — Discharge Summary (Signed)
[name], [name]             ACCOUNT NO.:  [id number]   MEDICAL RECORD NO.:  [id number]          PATIENT TYPE:  INP   LOCATION:  3710                         FACILITY:  [facility]   PHYSICIAN:  Eden Lathe. Einar Gip, MD       DATE OF BIRTH:  02-15-1945   DATE OF ADMISSION:  02/16/2004  DATE OF DISCHARGE:  02/17/2004                                 DISCHARGE SUMMARY   HISTORY OF PRESENT ILLNESS:  [name] is a 65 year old white female with  history of asthma, hypertension, coronary artery disease. She came to the  emergency room because of chest pain and that started about 11:30.  She felt  sick and weak and felt like she may pass out.  She had not eaten all day.  She tried eat and she apparently felt worse. She felt a pressure in her  chest and choking, anterior scapular pain and left arm pain, and shortness  of breath.  She was recently seen in the office on February 07, 2004 with  similar complaints.  She was just found to have an elevated blood pressure  of 160/102.  She was placed on clonidine, hydralazine and her  hydrochlorothiazide was increased.  She felt better for a few days and today  she had symptoms again.  She is scheduled for Cardiolite in our office  Monday on February 19, 2004.  She was seen by Dr. Alla German who decided  to admit her for an MI and she will reevaluate her in the morning.  On the  morning of February 17, 2004, blood pressure was 126/71, heart rate was 69,  respirations 21, temperature 97.4.  SaO2 saturations were 96%.  On the night  of admission, her blood pressure was elevated at 157/91 and she has some  slight peripheral edema.  We had given her some IV Lasix overnight.  She was  feeling better on the morning of February 17, 2004; seen by Dr. Einar Gip,  considered stable to be discharged home.  She is to have an outpatient  Cardiolite on Monday and she is not return to work until Thursday.  She had  no further chest pain or no palpitations and no shortness of  breath.   LABORATORY DATA:  Sodium 141, potassium 2.4, BUN 17, creatinine 1, glucose  115.  CK-MB and troponin were negative x2.  TSH was 1.23. BNP 189.4.  Chest  x-ray showed no acute distress.   DISCHARGE MEDICATIONS:  1.  Diltiazem ER 180 mg one tablet per day.  2.  Clonidine 0.2 mg at bedtime.  3.  Hydralazine 25 mg every eight hours.  4.  Aspirin 81 mg one time per day.  5.  Advair MDI and albuterol MDI as needed  6.  She is to stop Norvasc.  7.  Diovan 160 mg once a day.  8.  Hydrochlorothiazide 25 mg one time per day.  9.  She takes a potassium supplement.   FOLLOWUP:  She is to return to work on Thursday. She should have her  Cardiolite done on Monday as ordered.   DIET:  She should be  on a 2000 mg sodium, low-fat diet.   DISCHARGE INSTRUCTIONS:  We did discontinue her Norvasc and started her on  diltiazem because her heart rate in the emergency room was about 106.  She  was pacing on demand.  We were going to adjust her medications but they have  a lot of financial concerns so we did not further titration or change of her  medications at this time.  She was told that she may need Lasix instead of  the hydrochlorothiazide in the future.   DISCHARGE DIAGNOSES:  1.  Chest pain, shortness of breath.  She did have a CT scan that was      negative for pulmonary embolus.  Her BNP was low at 189.  She was not in      heart failure.  She may have had some extra fluid on board along with      her asthma which may have caused her shortness of breath.  2.  Asthma.  She has confusing symptoms secondary to the asthmatic component      of her health.  3.  Known arteriosclerotic coronary artery disease with a catheterization on      Jul 08, 2002 for which she had a 40% diagonal-2, 50% diagonal-1.  Her      ejection fraction at that time was 35-40%  4.  Left ventricular dysfunction.  This may be due to her pacemaker per Dr.      Lowella Fairy note.  They told her she had a normal EF  prior to her      pacemaker insertion.  5.  Permanent transvenous demand pacemaker (PTVDP) with a Guidant Discovery      II DR, May 28, 1999 secondary to presyncope and Mobitz II,      symptomatic.  6.  Metabolic syndrome.  7.  Obesity.  8.  Left bundle branch block.  9.  Hypertension, uncontrolled.  10. Irritable bowel syndrome.      [name]   BB/MEDQ  D:  02/17/2004  T:  02/17/2004  Job:  [id number]   cc:   Delfino Lovett A. Rollene Fare, M.D.  [phone], [place] [address]  Fax: [phone]   Frann Rider, M.D.  [place]  [place] [address]  Fax: [phone]

## 2010-06-28 NOTE — Discharge Summary (Signed)
[place]. [facility]  Patient:    [name], [name]                      MRN: [id number] Adm. Date:  05/28/99 Disc. Date: 05/29/99 Attending:  Delfino Lovett A. Rollene Fare, M.D. Dictator:   Otilio Carpen. Dorene Ar, F.N.P.C. CC:         Richard A. Rollene Fare, M.D.             Family Practice Teaching Service,             Merlinda Frederick McDiarmid, M.D.                           Discharge Summary  DISCHARGE DIAGNOSES: 1. Wenckebach, symptomatic.  Placement of a Guidant DDD pacer by Dr. Rollene Fare    on May 28, 1999. 2. Hypertension. 3. Asthma. 4. Patent coronary arteries. 5. Right bundle branch block.  DISCHARGE CONDITION:  Improved.  PROCEDURES:  May 28, 1999, placement of pacemaker as stated.  DISCHARGE MEDICATIONS: 1. Norvasc 10 mg daily, new dose. 2. Vioxx 25 mg daily. 3. Accolate 20 mg one twice a day.  DISCHARGE INSTRUCTIONS: 1. No work for one week until you see Dr. Rollene Fare. 2. No driving for one week. 3. Low-fat, low-salt diet. 4. Keep incision site dry.  If it gets damp or wet, pat dry. 5. Do not raise arm over head until you see Dr. Rollene Fare. 6. No lifting over 5 pounds with right arm. 7. Follow up with Dr. Rollene Fare, April 27, at 2 p.m.  HISTORY OF PRESENT ILLNESS:  [name] is a 65 year old patient that was originally admitted to [facility] on May 16, 1999, by the teaching service for chest pain and near syncope.  She had also complained of increasing fatigue, shortness of breath, and occasional sharp chest pain.  She had developed mid sternal crushing pain prior to admission.  On arrival, she was in a Wenckebach pattern with a right bundle branch block.  She underwent cardiac catheterization which revealed patent coronary arteries with a minimal 20% luminal irregularity of the first diagonal branch of the LAD.  Her LV function was completely normal.  It was felt she was stable to go home and was brought back in for pacemaker placement on  May 28, 1999, as an outpatient.  PAST MEDICAL HISTORY:  Hypertension for at least 10 years, asthma, and periodic chest pain.  ALLERGIES:  SULFA and IVP DYE.  OUTPATIENT MEDICATIONS: 1. Vioxx 25 mg daily. 2. Norvasc 5 mg daily. 3. Accolate twice a day, 20 mg.  FAMILY HISTORY:  Mother died of breast cancer.  History of heart failure. Father is alive, no coronary disease, positive for hypertension.  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  She stopped smoking 15 years ago.  No alcohol use.  Married with five sons.  REVIEW OF SYSTEMS:  See history and physical.  PHYSICAL EXAMINATION AT DISCHARGE:  VITAL SIGNS:  Blood pressure 120/86, respirations 20, temperature 97, pulse 74, oxygen saturation 96% on room air.  LUNGS:  Clear without rales, rhonchi, or wheezes.  HEART:  Regular rate and rhythm.  No lower extremity edema.  Right shoulder has some surrounding ecchymosis but no drainage, bleeding, or erythema. Incision site is well approximated, Steri-Strips in place.  LABORATORY DATA:  On April 10, hemoglobin 13.8, hematocrit 40.3, WBC 8.4, platelets 241, sodium 137, potassium 3.8, chloride 103, CO2 29, glucose 107, BUN  22, creatinine 0.9, calcium 9.4.  Thyroid test:  T4 9.1, T3 194.3 which was up slightly.  HOSPITAL COURSE:  Patient was admitted May 28, 1999, as an outpatient and underwent permanent pacemaker implantation by Dr. Rollene Fare and did well.  On May 29, 1999, she was able to ambulate without difficulty.  Chest x-ray is pending, no complaints, and will be followed up as an outpatient.  Discharged by Dr. Rollene Fare. DD:  05/29/99 TD:  05/29/99 Job: [id number]

## 2010-06-28 NOTE — H&P (Signed)
[place]. [facility]  Patient:    [name], [name]                    MRN: [id number] Adm. Date:  [id number] Attending:  McDiarmid, Blane Ohara. Dictator:   Linda Hedges, M.D. CC:         Robyn Haber, M.D., [place]                         History and Physical  CHIEF COMPLAINT:  Chest pain and dyspnea on exertion.  HISTORY OF PRESENT ILLNESS:  [age]-year-old female with no known cardiac history presents to the primary M.D.s office with complaints of dyspnea and left-sided chest pain for two to three days.  The pain onset occurred with walking and mopping.  She has had intermittent pain that would go away with rest. Positive nausea and diaphoresis associated with the pain.  Today she had radiation of the pain to the left arm, and pain occurred at rest.  Positive dizziness, no syncope. Positive sensation of heart beating in her throat.  She says she feels like someone is standing on her chest when the pain occurs.  The pain is now gone after sublingual nitroglycerin (she received a total of four).  She is pain-free after nitroglycerin drip at the time of interview.  She reports slight nonproductive cough.  No new orthopnea, no fevers.  Positive paroxysmal nocturnal dyspnea, positive dyspnea on exertion.  She had a normal catheterization around seven years ago by Dr. Melvern Banker.  She reports the pain as a 5-6 on a scale of 0-10.  She has ad different type of chest tightness since her breathing difficulty began in September 2000.  PAST MEDICAL HISTORY/PAST SURGICAL HISTORY: 1. Obesity. 2. Bronchospasms since surgery October 2000. 3. Remote history of tobacco abuse. 4. Hypertension. 5. Right shoulder pain for two weeks. 6. Status post right arthroscopic knee surgery in October 2000. 7. Status post right ankle surgery. 8. Status post bilateral tubal ligation around 20 years ago.  MEDICATIONS: 1. Norvasc 5 mg p.o. q.d. 2. Vioxx  25 mg q.d. 3. Accolate b.i.d. 4. Tylenol Arthritis p.r.n.  ALLERGIES:  SULFA and IV DYE.  PAST OBSTETRICAL HISTORY:  Spontaneous vaginal delivery x 5.  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  She quit smoking 15 years go. No alcohol, no illicit drugs.  She is married and has five boys.  FAMILY HISTORY:  Mom died at 16 of breast cancer.  She also had hypertension, diabetes, and "heart trouble."  Dad is living at age 40 with hypertension.  REVIEW OF SYSTEMS:  Positive recent social stressors.  Positive right shoulder ain x 2 weeks.  PHYSICAL EXAMINATION:  VITAL SIGNS:  Blood pressure 156/86, heart rate 84, O2 saturation 97% on room air, respiratory rate 18.  GENERAL:  Obese, pleasant, and in no acute distress.  HEENT:  Pupils are equal, round, and reactive to light and accommodation. Extraocular movements intact.  Oropharynx clear.  Tympanic membranes are normal.  NECK:  Supple.  Without lymphadenopathy or thyromegaly.  CARDIOVASCULAR:  Regular rate and rhythm, with no murmur.  CHEST:  Clear to auscultation bilaterally, no wheezes, good air movement in the  bases.  ABDOMEN:  Obese, soft, nontender.  Positive bowel sounds.  No hepatosplenomegaly.  EXTREMITIES:  Right knee and ankle have well-healed scars.  There is no lower extremity edema.  There are 2+ lower extremity pulses.  RECTAL:  Normal tone, guaiac-negative.  NEUROLOGIC:  Cranial nerves II-XII are intact.  LABORATORY DATA:  Chest x-ray PA and lateral:  No infiltrates.  EKG showed normal sinus rhythm with a second degree AV block.  There is a right  bundle branch block.  There are Q-waves in the lateral leads and small Q-waves n the inferior leads.  Sodium 142, potassium 3.5, chloride 109, CO2 109, BUN 22, creatinine 0.9, glucose 104.  White count 9.3, hemoglobin 13.5, hematocrit 39.3, platelets 243, with 68% neutrophils, 23% lymphs, and 5% monos.  PTT 31, PT 12.4, INR 1.0.  CK 187,  MB 2.0, index 1.2, troponin I less than 0.03.  ASSESSMENT:  [age]-year-old female with no known cardiac history presents ith chest pain and Mobitz II block on EKG.  PLAN: 1. Cardiac:  Will admit and continue nitroglycerin drip.  Will start heparin.    Monitor on telemetry.  She was given aspirin en route.  Will rule out MI with    serial enzymes and EKGs.  Consider an echo in the a.m.  Cardiology was contacted    immediately of the patient.  Will hold calcium-channel blocker.  Because of he    Mobitz type II block will hold on addition of beta blocker.  Check lipids in  a.m. 2. History of asthma:  Good room air O2 saturations.  No wheezing on exam.    Continue Accolate as per home regimen. 3. Right shoulder pain:  The patient may have a rotator cuff injury.  Will give    Tylenol p.r.n. for now and investigate further. DD:  05/16/99 TD:  05/17/99 Job: [id number]

## 2010-06-28 NOTE — Op Note (Signed)
NAME:  [name], [name]             ACCOUNT NO.:  [id number]   MEDICAL RECORD NO.:  [id number]          PATIENT TYPE:  AMB   LOCATION:  [place]                          FACILITY:  [facility]   PHYSICIAN:  Christopher E. Lucia Gaskins, M.D.DATE OF BIRTH:  05/12/1945   DATE OF PROCEDURE:  06/17/2005  DATE OF DISCHARGE:                                 OPERATIVE REPORT   PREOPERATIVE DIAGNOSIS:  Right posterior neck mass, questionable  lymphadenopathy.   POSTOPERATIVE DIAGNOSIS:  Right posterior neck mass, questionable  lymphadenopathy.   OPERATION PERFORMED:  Excisional biopsy of a right posterior neck mass.   SURGEON:  Leonides Sake. Lucia Gaskins, M.D.   ANESTHESIA:  Local 1% Xylocaine with epinephrine.   COMPLICATIONS:  None.   INDICATIONS FOR PROCEDURE:  [name] is a 65 year old female who has  had persistent right neck lymphadenopathy or mass that has been present now  for about two months.  On examination, she has deep right neck fullness,  palpation feels like a 1.5 cm nodule just behind the sternocleidomastoid  muscle on the right side.  She is taken to the operating room at this time  for excisional biopsy of excisional biopsy of right neck lymphadenopathy.   DESCRIPTION OF PROCEDURE:  The patient was brought to the minor room.  The  neck was prepped with Betadine solution.  It was injected with Xylocaine  with epinephrine for local anesthetic.  A horizontal incision was made  directly over the mass.  Dissection was carried down through the  subcutaneous tissue.  There was a fair amount of fat adipose tissue in this  region with just small less than 1 cm lymph node.  The spinal accessory  nerve was in the same area, was identified and preserved. Dissection was  carried down to the deep cervical fascia in the muscle over the deep  cervical region and really no discrete enlarged node was identified.  The  smaller node and some of the surrounding fat tissue was removed and sent to  pathology.  I really did not locate any significant sized lymphadenopathy or  mass.  Palpation of the deep musculature was a little lumpy or full.  Node  dissection was carried down deep to the deep cervical musculature.  This  completed the procedure.  Hemostasis was obtained with the cautery and  defect was closed with 3-0 chromic suture subcutaneously and 5-0 nylon  reapproximated the skin edges.  Dressing was applied.  Ane was  subsequently discharged home.   DISPOSITION:  [name] is discharged home on Keflex 500 mg twice daily for  four days, Tylenol and Vicodin as needed for pain.  We will have her follow  up in my office in six days to have sutures removed and review pathology.           ______________________________  Leonides Sake Lucia Gaskins, M.D.     CEN/MEDQ  D:  06/17/2005  T:  06/18/2005  Job:  [id number]   cc:   Theophilus Kinds, M.D.  Fax: [phone]

## 2010-06-28 NOTE — Op Note (Signed)
[place]. [facility]  Patient:    [name], [name] Visit Number: [id number] MRN: [id number]          Service Type: DSU Location: RCRM 2550 07 Attending Physician:  Marlon Pel Dictated by:   Hedda Slade, M.D. Admit Date:  02/18/2001                             Operative Report  PREOPERATIVE DIAGNOSIS:  Right knee vertical fractured patella.  POSTOPERATIVE DIAGNOSIS:  Right knee vertical fractured patella.  OPERATIVE PROCEDURES:  Open reduction and internal fixation.  SURGEON:  Hedda Slade, M.D.  [place]:  This patient has a pacemaker in place, therefore was done in the main OR with plans to observe her overnight.  She was given a general anesthetic by endotracheal tube after an IV was started.  A thigh-high proximal tourniquet was applied on the right and the isolated with a U-drape. She was prepped from the edges of the U-drape to the tips of the toes with DuraPrep and then draped in the usual manner.  I made a vertical incision down to the level of the prepatellar bursa.  I had to make it long enough so that I could mobilize the skin laterally to the area of the fracture.  The area of the fracture periosteum was cleared of edges on both sides.  Clot and debris were removed from the fracture area.  A moderate amount of bloody fluid was released.  Small irregular edges of articular cartilage were smoothed back. Then I was able to have the fracture reduced anatomically.  Holding this reduced with clamps, I used guide wires and then drills and then fixed the fracture with 44 mm cancellus lag screws.  These were 4.0 in diameter.  This reduced the fracture exactly and held it anatomically through flexion and extension.  I then obtained a single AP to confirm the length and the positioning that was good.  I then irrigated the area and approximated the retinaculum with 2-0 Vicryl and the subcutaneous tissues with 2-0 Vicryl.   At that point, there was a vertical linear anatomically coapted skin incision. Therefore, I painted the skin with benzoin and held the skin edges with full-length 1/2-inch Steri-Strips.  I applied a bulky dressing.  I placed the knee in a knee immobilizer.  Prior to closure, the proximal pneumatic tourniquet was let down.  There was no significant bleeding.  The patient returned to the recovery room in good condition. Dictated by:   Hedda Slade, M.D. Attending Physician:  Marlon Pel DD:  02/18/01 TD:  02/18/01 Job: [id number]

## 2010-06-28 NOTE — Cardiovascular Report (Signed)
NAME:  [name], [name]                       ACCOUNT NO.:  [id number]   MEDICAL RECORD NO.:  [id number]                   PATIENT TYPE:  INP   LOCATION:  [id number]                                 FACILITY:  [facility]   PHYSICIAN:  Octavia Heir, M.D.             DATE OF BIRTH:  09/14/1945   DATE OF PROCEDURE:  07/08/2002  DATE OF DISCHARGE:  07/09/2002                              CARDIAC CATHETERIZATION   PROCEDURE:  1. Left heart catheterization.  2. Coronary angiography.  3. Left ventriculogram.  4. Ascending aortography.  5. Abdominal aortogram.   ATTENDING PHYSICIAN:  Octavia Heir, M.D.   COMPLICATIONS:  None.   INDICATIONS:  Mrs. Mccommon is a 65 year old female patient of Dr. Terance Ice and Dr. Darcus Austin with a history of obesity, hyperlipidemia, and  recurrent chest pain.  She has had cardiac catheterization in 1998 and 2001  which revealed noncritical CAD.  She was readmitted to the ER on Jul 08, 2002, with recurrent chest pain rated at a 9/10.  The patient does have a  permanent pacemaker in for sick sinus syndrome, making her EKG  uninterpretable.  She is now referred for cardiac catheterization to assess  her coronary anatomy.   DESCRIPTION OF PROCEDURE:  After obtaining informed consent, the patient was  brought to the cardiac cath lab where the right and left groins were shaved,  prepped and draped in the usual sterile fashion.  An ECG monitor was  established.  Using modified Seldinger technique, a 6-French introducer  sheath was inserted in the right femoral artery.  With a 6-French diagnostic  catheter, we then performed diagnostic angiography.  This revealed a large  left main with no significant disease.  The LAD was large vessel which  coursed through the apex with two diagonal branches.  The LAD was noted to  have a 30% narrowing after the takeoff of the second diagonal.  First  diagonal was medium size vessel with marked disease distally and  has a 50%  ostial narrowing.  Second diagonal was a medium size vessel with a 40%  ostial narrowing.   The left circumflex was a large vessel, coursing __________ giving rise to  two obtuse marginal branches.  __________  circumflex has no significant  disease.  The first and second OMs were large vessels with no significant  disease.   The right coronary artery was a large vessel, dominant.  It gives rise to  the PDA at its posterolateral branch.  There is a mild 30% RCA mid vessel  narrowing.  The PDA and posterolateral branch had no significant disease.   Left ventriculogram reveals mild to moderately depressed EF of 35-40% with  anterolateral and apical hypokinesis.   Ascending aortography reveals no evidence of aortic dissection.  There is no  evidence of renal artery stenosis on abdominal aortogram.   HEMODYNAMICS:  Systemic aortic pressure was [id number], LV systolic pressure  153/19.  LVEDP 24.    CONCLUSION:  1. No significant coronary artery disease.  2. Moderately depressed left ventricular systolic function __________  as     noted above.  3. No evidence of aortic dissection.  4. No evidence of renal artery stenosis.  5. Systemic hypertension.  6. Elevated left ventricular end diastolic pressure.                                               Octavia Heir, M.D.    RHM/MEDQ  D:  07/08/2002  T:  07/10/2002  Job:  [id number]   cc:   Frann Rider, M.D.  [place]  [place] [address]  Fax: [phone]   Richard A. Rollene Fare, M.D.  [phone] [address], [place] [address]  Fax: [phone]

## 2010-06-28 NOTE — Op Note (Signed)
NAME:  [name], [name]             ACCOUNT NO.:  [id number]   MEDICAL RECORD NO.:  [id number]          PATIENT TYPE:  OIB   LOCATION:  5012                         FACILITY:  [facility]   PHYSICIAN:  Mark C. Lorin Mercy, M.D.    DATE OF BIRTH:  05-May-1945   DATE OF PROCEDURE:  11/17/2005  DATE OF DISCHARGE:  11/18/2005                               OPERATIVE REPORT   This is a redictation, previous dictation lost.   PREOPERATIVE DIAGNOSIS:  C4-5 large herniated nucleus pulposus with core  compression and myelopathy.   POSTOPERATIVE DIAGNOSIS:  C4-5 large herniated nucleus pulposus with  core compression and myelopathy.   PROCEDURE:  C4-5 cervical diskectomy and fusion, allograft, and plating.   ASSISTANT:  Epimenio Foot, P.A.   ANESTHESIA:  GOT plus Marcaine skin local.   ESTIMATED BLOOD LOSS:  Minimal.   BRIEF HISTORY:  This 65 year old female has been seen for problems that  occurred when she slipped and fell, injuring her neck with several  months history of severe neck pain and right shoulder pain.  Initially,  workup had been delayed due to the pacemaker that she had and she  underwent a myelogram CT scan, which showed a large central disk  herniation with poor compression and narrowing down the canal diameter  to less than 6 mm.  She was having significant lower extremity weakness.  She had an on-job injury.  She tripped over carpet as outlined in the  10307 office note.  Myelogram CT scan showed severe compression that she  is brought in for single level intracervical diskectomy effusion.  She  has some other levels that show some degenerative changes and  spondylosis, but nothing as severe as the single 4-5 level with large  fragment.   DESCRIPTION OF PROCEDURE:  After induction of general anesthesia,  orotracheal intubation timeout procedure, preoperative antibiotics,  standard prep with DuraPrep, had altered traction.  The area was squared  with towels.  A dry skin  marker was used in the prominent skin fold.  The area squared with towels, Betadine antibiotic applied.  A thyroid  sheet was applied with sterile __mayo________  at the head.  The patient  was made with blunt dissection down to the 4-5 level.  Prior sheath and  contents were lateral.  Esophagus and trachea were midline.  Prominent  spur was noted anterior and across the table C-arm, which was draped  underneath with the sterile drape, confirmed that this was the  appropriate level.  Diskectomy was performed.  Once the disk was moved  back to posterior longitudinal ligament, the operative microscope was  draped and brought in and there was a rent in posterior longitudinal  ligament with large disk fragments that were removed in chunks without  completely decompressing the dura.  Once the dura was decompressed, the  dura was seen to bulge back up level with the posterior cortex.  Spurs  were removed right and left with Kerrison rongeur 1 and 2 mm to enlarge  the foramina.  The wound was irrigated.  The operative field was dry.  Sizes were obtained and interbody  space was placed using the Biomet  allograft using the lordotic graft.  A Biomet view lock plate was  selected and four 14 mm screws were placed.  Checked under C-arm with  drilling and then placed with self-tightening screws.  Due to the  patient's thick shoulder, short neck, and body habitus, it was difficult  to visualize the inferior screws even at 4-5 with traction applied to  the head and arms pulled down.  Screws appeared to be sliding up  slightly, but it gave excellent purchase and did not violate the  endplate.  There was good stability of the graft prior to plate  placement.  Hemovac was placed in a separate stab incision with an in-  and-out technique.  The incision was closed with 3-0, 4-0 Vicryl  subcuticular closure, postop dressing, Steri-Strips and a soft cervical  collar.  The patient tolerated the procedure well  and was transferred to  the recovery room in stable condition.  Neurologic exam showed  improvement in the lower extremity, generalized weakness postoperatively  and normal biceps, triceps, and brachial radialis.  Instrument and  needle count was correct.      Mark C. Lorin Mercy, M.D.  Electronically Signed     MCY/MEDQ  D:  03/04/2006  T:  03/05/2006  Job:  [id number]

## 2010-06-28 NOTE — Discharge Summary (Signed)
NAME:  [name], [name]                       ACCOUNT NO.:  [id number]   MEDICAL RECORD NO.:  [id number]                   PATIENT TYPE:  INP   LOCATION:  [id number]                                 FACILITY:  [facility]   PHYSICIAN:  Octavia Heir, M.D.             DATE OF BIRTH:  [birth year]   DATE OF ADMISSION:  07/08/2002  DATE OF DISCHARGE:  07/09/2002                                 DISCHARGE SUMMARY   DISCHARGE DIAGNOSES:  1. Chest pain, nonspecific.     a. Negative myocardial infarction.     b. Nonobstructive coronary disease.     c. Possible gastrointestinal pain.  2. Abnormal Cardiolite, false positive.  3. Decreased left ventricular function ejection fraction 35-40%.  4. Hypokalemia, resolved.  5. History of pacemaker secondary to heart block and right bundle branch     block.  6. Elevated white blood cell count, but no fever.   CONDITION ON DISCHARGE:  Improved.   PROCEDURES:  Jul 08, 2002 combined left heart catheterization by Dr. Alla German.   DISCHARGE MEDICATIONS:  1. Enteric-coated aspirin 81 mg daily.  2. Norvasc 5 mg daily.  3. Maxzide 37.5/25 one daily.  4. Diovan 80 mg daily.  5. Advair 1 puff twice a day.  6. Protonix 40 mg daily.   DISCHARGE INSTRUCTIONS:  1. No work until July 13, 2002.  2. No strenuous activity, no sexual activity, no lifting over 10 pounds for     three days, then resume regular activities.  3. Low-fat, low-salt diet.  4. Wash cath site with soap and water. Call if any bleeding, swelling, or     drainage.  5. Follow-up with Dr. Rollene Fare in two weeks. The office will call.  6. See Dr. Delfin Edis for workup, our office will make an appointment.   HISTORY OF PRESENT ILLNESS:  A 65 year old white married female who  presented to the emergency room Jul 08, 2002 with complaints of chest pain.  She had a prior history of a cath with only 20% stenosis of the diagonal in  2001. She was seen by Dr. Rollene Fare Jun 21, 2002, had more  chest pain,  shortness of breath. She underwent Persantine Cardiolite Jun 22, 2002 that  revealed lateral ischemia and was scheduled for cath at [facility] the  next week. Over the last two to three days prior to admission she had a  history of increased chest pain, shortness of breath. The pain was located  in the left breast area, down her left arm. The pressure rated a 9 out of 10  with shortness of breath, but no diaphoresis. It was worse with exertion. It  has happened at rest. Never awakened from sleep. In the emergency room she  was pain-free. EKG showed pacing.   PAST MEDICAL HISTORY:  1. Hypertension.  2. Hyperlipidemia.  3. Asthma.  4. Obesity.  5. Coronary artery disease as described. Pacemaker Guidant Discovery  II     placed May 28, 1999 for AV block and right bundle branch block.  6. Knee surgery in the past.   OUTPATIENT MEDICATIONS:  1. Norvasc 5 daily.  2. Advair 1 puff twice a day.  3. Aspirin 81 daily.  4. Albuterol MDI one to two puffs as needed.  5. Hydroxizine 10 mg p.r.n. h.s.  6. Claritin p.r.n.  7. Allergy shot twice a week.  8. Maxzide 37.5/25 one daily.  9. Diovan 80 daily.   ALLERGIES:  Sulfa.   For family history, social history, and review of systems see H&P.   PHYSICAL EXAMINATION AT DISCHARGE:  VITAL SIGNS:  Blood pressure 108/64,  pulse 80, respirations 20, temperature 96.8, oxygen saturation on room air  96%.  GENERAL:  Alert and oriented female.  HEART:  S1, S2. Regular rate and rhythm.  LUNGS:  Clear.  ABDOMEN:  Positive bowel sounds.  EXTREMITIES:  Right groin without hematoma. There are 2+ pedal pulses.   LABORATORY DATA:  Hemoglobin on admission 13.6, hematocrit 40.5, WBC 6.1, it  did bump up to 14.1 prior to discharge. Platelets 227. Neutrophils 67,  lymphs 21, monos 8, eos 3, baso 1. ProTime 12.2, INR 0.8. PTT 31.   Chemistry:  Sodium 141, potassium 3.6, chloride 108, CO2 23, glucose 150.  BUN 22, creatinine 1.0, calcium  9.6, total protein 6.7, albumin 3.5. AST 35,  ALT 36, ALP 94. Total bili 0.5. On day two her potassium dropped to 3.4, but  was supplemented.   Cardiac enzymes initially CK 371, MB 6.2, troponin 0.01. The second CK 299,  MB 4.2, and troponin 0.02. The third CK 218, MB 3.7, all negative for MI.   Portable chest x-ray:  No evidence of acute pulmonary process. Stable  pacemaker device.   EKG:  She is pacing.   Cardiac catheterization:  A 30% RCA stenosis and 15-30% stenosis of her LAD  system.   HOSPITAL COURSE:  Tryna Crow was admitted by Dr. Tami Ribas on Jul 08, 2002 with chest pain. She had an abnormal Cardiolite and was scheduled for  an outpatient catheterization, but presented secondary to increased chest  pain. She was admitted by Dr. Tami Ribas and placed on nitroglycerin and IV  heparin. Plans were for cardiac catheterization which were done the day of  admission, which revealed nonobstructive coronary disease as stated.   By Jul 09, 2002 she was pain-free even with walking in the halls. She had no  complaints. Was feeling well. White count was up, but no fevers. Dr. Marilu Favre saw her, assessed her, and discharged her home. She will follow up as  an outpatient.     Otilio Carpen. Oda Cogan, M.D.    LRI/MEDQ  D:  08/03/2002  T:  08/04/2002  Job:  [id number]   cc:   Octavia Heir, M.D.  [phone], [place] [address]  Fax: [phone]   Frann Rider, M.D.  [place]  [place] [address]  Fax: [phone]   Richard A. Rollene Fare, M.D.  [phone] [address], [address]  [place]  [place] [address]  Fax: [phone]    cc:   Octavia Heir, M.D.  [phone] [address], [place] [address]  Fax: [phone]   Frann Rider, M.D.  [address]  [place]  [place] [address]  Fax: [place] Rollene Fare, M.D.  [phone] [address] [place] [address]  Fax: [phone]

## 2010-06-28 NOTE — Discharge Summary (Signed)
[facility]  Patient:    [name], [name]                      MRN: [id number] Adm. Date:  [id number] Disc. Date: [id number] Attending:  Epifanio Lesches Dictator:   Alroy Bailiff, P.A.-C. CC:         Richard A. Rollene Fare, M.D.                           Discharge Summary  ADMITTING DIAGNOSES: 1. Pacemaker site infection. 2. Status post permanent pacemaker implantation, May 28, 1999, that was    placed for recurrent syncope with documented bradycardia, recurrent    atrioventricular block and underlying right bundle branch block. 3. History of chest pain with normal coronary arteries by catheterization on    May 17, 1999. 4. Exogenous obesity. 5. Hypertension. 6. Cigarette abuse. 7. Chronic obstructive pulmonary disease. 8. Hyperlipidemia.  DISCHARGE DIAGNOSES: 1. Pacemaker site infection. 2. Status post permanent pacemaker implantation, May 28, 1999, that was    placed for recurrent syncope with documented bradycardia, recurrent    atrioventricular block and underlying right bundle branch block. 3. History of chest pain with normal coronary arteries by catheterization on    May 17, 1999. 4. Exogenous obesity. 5. Hypertension. 6. Cigarette abuse. 7. Chronic obstructive pulmonary disease. 8. Hyperlipidemia. 9. Status post IV antibiotic therapy for pacemaker site infection.  HISTORY OF PRESENT ILLNESS:  [name] is a 65 year old female who had a permanent pacemaker implantation on May 28, 1999.  This was a DDD, AD, AV universal discovery generator and was placed at [facility].  This was done for recurrent syncope with documented bradycardia, recurrent AV block, and underlying right bundle branch block.  She also has a history of chest pain with normal coronary arteries by catheterization on May 17, 1999.  As well, she has exogenous obesity, hypertension, cigarette abuse, COPD, and hyperlipidemia.  She presented  to the office on June 03, 1999, for pacemaker and follow-up. At that time, her Steri-Strips were removed.  Unfortunately, there was a small area of purulent drainage from the medial corner of the pocket and some separation of the incision.  This was cleaned thoroughly with Betadine thoroughly and the incision line was debrided.  There was a small open area in the medial aspect with some yellowish purulent drainage.  This did not appear, however, to communicate with the pocket.  The pocket was not erythematous or warm.  It was difficult to tell about swelling because of obesity.  The wound was cleansed with Betadine, debrided locally, Neosporin was applied, and Steri-Strips were reapplied, particularly on the medial corner.  Chest x-ray at that time showed good position of the intraventricular electrodes.  Strip shows atrial pacing and atrial tracking with ventricular pacing.  She had not had any URI symptoms.  As well, she had had no constitutional fevers or fever or chills to suggest deep infection.  At that time, Dr. Rollene Fare planned for admission for observation of her pocket and for other treatments.  He wanted to take her off of her chronic anti-inflammatory agents and continue her on Vioxx.  He will plan for culture from the small amount of purulent drainage from the medial corner of the pocket.  He will apply Neosporin therapy and start Keflex 500 mg q.i.d.  He felt that this was a superficial infection and not communicating with the pocket and that  we could treat it with antibiotics and ______ therapy. Certainly, if this were to become worse and there was evidence of pocket infection, the patient would need system removal.  He then planned for admission to the hospital for antibiotics and for observation.  HOSPITAL COURSE:  On June 06, 1999, [name] was without complaints.  She was afebrile.  Blood pressure was 110 to 145/55 to 70.  Heart rate was in the 90s to 100.  Her  lungs were clear.  Heart had a regular rhythm.  The pacer site had no erythema or drainage at that time.  She was AV sequentially pacing.  At that time, she was on IV vancomycin.  She was then seen by Dr. Rollene Fare, as well.  He felt that the pacer incision looked better and there was an opening at the medial corner.  He rechecked it, cleansed it, and did some minor debridement where he placed the Steri-Strips.  She had no pocket tenderness or drainage.  He planned for continued IV vancomycin and local care.  He felt that, if the site looked okay by Saturday, then she could probably go home at that time and follow up with him on the following Monday with p.o. antibiotics.  On April 27, [name] continued to be without complaints, continued to be afebrile.  The pacer site continued to have some mild erythema at the lateral edge but no drainage.  The pacer site infection seemed much improved on IV vancomycin.  Her CBC was normal.  We plan to keep until the following day but, if continuing to look good at that time, we will be able to discharge home on p.o. antibiotics at that time.  On April 28, [name] continued to feel fine without complaints.  She was afebrile.  The wound site continued to have some very mild erythema but was nontender and had no drainage.  She was felt to be stable for discharge home with oral antibiotics at that time and follow-up with Dr. Rollene Fare on Monday.  HOSPITAL CONSULTS:  None.  HOSPITAL PROCEDURES:  None.  HOSPITAL LABS:  Urinalysis negative.  Metabolic profile normal with sodium 139, potassium 4.2, glucose 112, BUN 25, creatinine 1.0.  LFTs normal except ALT slightly elevated at 48, ALP slightly elevated at 134.  CBC was normal with WBC 7.5, hemoglobin 13.3, hematocrit 40.4, platelets 238.  ESR somewhat elevated at 30.  DISCHARGE MEDICATIONS: 1. Accolate 20 mg one p.o. b.i.d. 2. Protonix 40 mg q.d. 3. Norvasc 10 mg one q.d. 4. Toprol XL 50 mg  one q.d. 5. Calcium 600 mg with vitamin D b.i.d. 6. Centrum Silver one p.o. q.d. 7. Levaquin 500 mg one q.d.   DISCHARGE INSTRUCTIONS:  Do not lift, push, or pull anything until after seeing Dr. Rollene Fare on Monday.  Do not lift the arm above the head and do not drive.  As well, they were supposed to use triple antibiotic ointment b.i.d. to the wound and keep the site clean and dry, and cover with a 4 x 4 bandage. They are to call Dr. Young Berry office at [phone] for any problems or drainage from the site.  They were to call our office on Monday to make an appointment to see Dr. Rollene Fare on that Monday for follow-up of this infection at the pacemaker site. DD:  07/15/99 TD:  07/17/99 Job: [id number]

## 2010-06-28 NOTE — Op Note (Signed)
NAMEKAIDENCE, MALAN             ACCOUNT NO.:  [id number]   MEDICAL RECORD NO.:  [id number]          PATIENT TYPE:  INP   LOCATION:  2035                         FACILITY:  [facility]   PHYSICIAN:  Octavia Heir, MD  DATE OF BIRTH:  Apr 12, 1945   DATE OF PROCEDURE:  02/16/2006  DATE OF DISCHARGE:                               OPERATIVE REPORT   PROCEDURES:  1. Explant of a Guidant Discovery II DR generator, model F6384348, serial      [id number], originally implanted on May 28, 1999.  2. Interrogation of atrial and ventricular leads.  3. Pocket modification.  4. Implant of a Guidant Insignia I Plus DR, model F5372508, serial      [id number].   ATTENDING SURGEON:  Octavia Heir, MD.   COMPLICATIONS:  None.   INDICATIONS:  Miss [name] is a 65 year old female patient of Dr.  Terance Ice and Dr. Darcus Austin with a history of bradycardia with  presyncope and second-degree A-V block, history of underlying right  bundle branch block, history of noncritical CAD with an EF of 25 to 35%,  history of hypertension, and a history of asthma, who recently has been  found to be at end of life on her generator.  She developed increasing  fatigue and some mild shortness of breath, and was found to have  reverted to a VVI mode at [facility].  She is now referred for generator  changeout.   DESCRIPTION OF OPERATION:  After the patient gave informed written  consent, the patient was brought to the cardiac cath lab, where the her  right groin and right chest were shaved, prepped and draped in sterile  fashion.  ECG monitoring was established.  Lidocaine 1% was then used to  anesthetize the right femoral area, and a 6-French venous sheath was  then easily inserted in the right femoral vein.  Under fluoroscopic  guidance, a temporary transvenous pacing wire was then floated into the  RV apex.  Thresholds were determined and this was secured to the sheath.  Our attention was then turned to the right  chest.   Lidocaine 1% was then used the anesthetize over the previously implanted  generator.  Next, approximately a 2.5 to 3 cm incision was then carried  out over the previous generator, and hemostasis was obtained with  electrocautery.  Blunt dissection was then used to carry this down to  the pacer capsule.  The capsule was then opened with a scalpel and the  generator and leads were then delivered from the pocket.  As previously  stated, the generator was a [employer] II DR, model [id number], serial  [id number], originally implanted on May 28, 1999.  Her atrial lead was a  model U859585, serial [id number], implanted May 28, 1999.  The ventricular  lead was a model F5952493, serial [id number], also implanted May 28, 1999.  The atrial lead was then disconnected from the header and thresholds  were determined.  The P waves were measured at 7.1 mV.  Impedance was  594 ohms.  Threshold in the ventricle was 0.4 V at 0.5 msec.  The  ventricular lead was then disconnected from the header and thresholds  were determined.  It should be noted that there were no R waves  intrinsically, as she was dependent, though she did have temporary paced  R waves at 24.9 mV.  Impedance was 854 ohms.  Threshold in the ventricle  was 0.5 V at 0.5 msec.  The pocket was then copiously irrigated with 1%  kanamycin solution, and, again, hemostasis was confirmed.  The leads  were then connected in serial fashion to a [place],  model F4600501, serial [id number].  Header screws were tightened and pacing  was confirmed.  The generator and the leads were then delivered back  into the pocket.  The subcutaneous layer was then closed using running 2-  0 Vicryl.  The skin was then closed using running 4-0 Vicryl.  Steri-  Strips were then applied.  Under fluoroscopic guidance, the temporary  transvenous wire was then removed, and the right femoral sheath was  removed.  The patient was then transferred to the recovery  room in  stable condition.   CONCLUSIONS:  1. Successful explant of a Guidant Discovery II DR generator, model      [id number], serial [id number], originally implanted on May 28, 1999.  2. Atrial and ventricular lead interrogation.  3. Pocket modification.  4. Implant of a Guidant Insignia I Plus DR, model F4600501, serial      [id number].  5. Insertion of a temporary transvenous pacing wire.      Octavia Heir, MD  Electronically Signed     RHM/MEDQ  D:  02/16/2006  T:  02/16/2006  Job:  [id number]   cc:   Delfino Lovett A. Rollene Fare, M.D.  Frann Rider, M.D.

## 2010-06-28 NOTE — Discharge Summary (Signed)
NAMEMarland Kitchen  Alicia, [name]             ACCOUNT NO.:  [id number]   MEDICAL RECORD NO.:  [id number]          PATIENT TYPE:  INP   LOCATION:  2035                         FACILITY:  [place]   PHYSICIAN:  Octavia Heir, MD  DATE OF BIRTH:  06/27/1945   DATE OF ADMISSION:  02/13/2006  DATE OF DISCHARGE:                               DISCHARGE SUMMARY   PRIMARY CARDIOLOGIST:  Dr. Terance Ice.   PRIMARY CARE PHYSICIAN:  Dr. Darcus Austin.   DISCHARGE DIAGNOSES:  1. Status post pacemaker generator change out with placement of an      [employer] 1+ DR model 1297, serial number [id number].  2. Status post pacemaker placement May 25, 1999, for recurrent      presyncope bradycardia and type 2 atrioventricular block.  3. Weakness and presyncope.  4. Nonischemic cardiomyopathy, with ejection fraction of 25% to 35%.  5. Hypertension.  6. Asthma.  7. Status post cervical spine surgery October 2007.   BRIEF HISTORY:  [name] is a very pleasant 65 year old female with  a history of nonischemic cardiomyopathy, with ejection fraction 25% to  35%.  She underwent pacemaker placement in April 2001 for recurrent  syncope bradycardia and second-degree AV block.  She presented to our  office on February 13, 2006, with complaints of feeling weak and  presyncopal for about a week.  Her pacemaker was interrogated, and she  was found to be at end-of-life and had reverted to VVI pacing on  December 9th.  Because of her symptoms she was admitted to [facility] for pacemaker generator change.  Because of her low EF, it was  felt that it may be a very good time to upgrade to a ICV; however, Ms.  [name] has had difficulty with poor healing in the past and has  recently undergone cervical spine surgery and it was not interested in  undergoing further invasive procedures such as an ICV at this time.  However, she does understand the benefits of the device and will  consider upgrade to that in the  future.   PROCEDURE:  On February 16, 2006, [name] underwent dual chamber  pacemaker generator change.  She received an [employer] 1+ DR located in  the right chest.  Atrioventricular leads were tested and functioned  appropriately.  Remote was set to DBD 60 and 130.  Please see dictated  OP report for further details.   HOSPITAL COURSE:  [name] was admitted to [facility] on February 13, 2006, with complaint of weakness since presyncope.  Upon pacemaker  interrogation it was noted that she had converted to VVI pacing  secondary to her pacer being at end-of-life.  She was bradycardic and  underwent pacemaker gen change on February 16, 2006, by Dr. Tami Ribas.  She  tolerated the procedure without difficulty and today is deemed ready for  discharge.  Her chest x-ray revealed no lead dislodgment and no  complications secondary to her generator changeout.  She was somewhat  hypokalemic and we will reassess BNP and replete her potassium as deemed  necessary.  Overall, she has no complaints and  is anxious for discharge  home.  We will schedule her followup appointment with Dr. Rollene Fare for  site check in 1 week with further followup pending his recommendation.   DIET:  She is to follow a low-sodium, heart-healthy diet.   ACTIVITY:  She is to increase her activity slowly.  She may walk up  steps, to avoid heavy lifting for the next 2 weeks.  She should avoid  driving for the next week.   WOUND CARE:  She is keep her incision clean and dry.  She is to contact  our office with any redness, swelling, or drainage from the site, or  with the onset of fever, and of course with any problems or questions.   FOLLOWUP:  She is to follow up with Dr. Rollene Fare in 1 week for site  check.  Our office will contact her with a specific date and time.   DISCHARGE MEDICATIONS:  1. Aspirin 81 mg daily.  2. Lasix 40 mg daily.  3. Vytorin 10/40 mg daily.  4. Tekturna 150 mg daily.  5. Singulair 10 mg  daily.  6. Claritin 10 mg daily.  7. Toprol-XL 12.5 mg daily.  8. Diovan 320 mg daily.  9. Hydrochlorothiazide 25 mg twice daily.  10.Clonidine 0.2 mg q.h.s.  11.K-Dur over the counter potassium 20 mEq daily.  12.Nasonex 1 spray twice daily.  13.Serevent 1 puff twice daily.  14.Symbicort inhaler twice daily.  15.Protonix 40 mg daily.     ______________________________  Bari Mantis, NP      Octavia Heir, MD  Electronically Signed    LS/MEDQ  D:  02/17/2006  T:  02/17/2006  Job:  [id number]   cc:   [facility]  Frann Rider, M.D.

## 2010-06-28 NOTE — Discharge Summary (Signed)
NAMEMarland [name]  SAANJH, NERBY             ACCOUNT NO.:  [id number]   MEDICAL RECORD NO.:  [id number]          PATIENT TYPE:  INP   LOCATION:  2035                         FACILITY:  [place]   PHYSICIAN:  Richard A. Rollene Fare, M.D.DATE OF BIRTH:  [birth year]   DATE OF ADMISSION:  02/13/2006  DATE OF DISCHARGE:  02/19/2006                               DISCHARGE SUMMARY   [name] is a 65 year old white married female of Dr. Terance Ice who came to the office to be seen secondary to feelings of  weakness, presyncope.  She does have a pacemaker that was near end of  life.  She was seen in the office.  Her pacemaker had reverted into VVI  pacing on December 9 because of end of life; thus, she was admitted for  pacemaker generator change.  The procedure was performed on February 16, 2006 by Dr. Alla German.  She had an explanted [employer] 2DR,  and she had an implanted Insignia 1 Jackpot.  She was put in DDD, low rate of 50, high rate of 130.   Post procedure, she did well except she did have a run of PSVT.  It was  decided to keep her overnight and restart her Toprol.  The patient was  feeling weak.  The following day, her blood pressure was very low at  86/60 to 76/88 with a prior history of hypertension on multiple  medications.  Her medications were held on February 18, 2006.  The  following morning, February 19, 2006, she was seen by Dr. Terance Ice.  Her blood pressure was 106/82.  He thought she was stable to  be discharged home.  We will just restart her medications as an  outpatient beginning with Toprol XL 25 mg a day and her HCTZ 25 mg a day  at the time of discharge.  She was also able to restart her aspirin at  the time of discharge.  He will follow up her in one week's time.   LABORATORY DATA:  Sodium 136, potassium 3.6, glucose 125, BUN 40,  creatinine 1.5.  Her glomerular filtration rate was 35.  The BNP on  February 13, 2006  was 187, TSH 1.547.  Her hemoglobin was 15.1, hematocrit  45, platelets 216, and WBC is 10.5.   Her chest x-ray showed no pneumothorax.   DISCHARGE MEDICATIONS:  1. Aspirin 81 mg a day.  2. Toprol XL 25 mg a day.  3. HCTZ 25 mg a day.  4. Potassium supplement 1 pill per day.  5. Vytorin 10/40 once a day.  6. Singulair 10 mg daily.  7. Claritin 10 mg daily.  8. Nexium 40 mg a day.  9. Serevent 1 puff twice daily.  10.bronchodilator 1 puff twice daily.  11.Mucinex 600 mg twice a day.   She will follow up with Dr. Terance Ice in the regional office on  February 23, 2006 at 2:45.  She should do no reaching, stretching, or  lifting with her arm  exercise.  She should not return to work yet.   DISCHARGE  DIAGNOSES:  1. End-of-life pacemaker with underlying 2:1 heart block.  2. Status post PTVDP change with explantation of old device and      implantation of an Insignia 1/CR Model Guidant placed in VVV low      rate 50, high rate 130.  3. Post-procedure hypotension.  4. History of hypertension on multiple medications.  She is now      holding her Lasix, her Diovan, her clonidine, her Tekturna.  5. History of nonischemic cardiomyopathy with ejection fraction of 25      to 35%.  6. No significant coronary artery disease by catheterization in 2004.      Last Cardiolite February 19, 2004 showed no ischemia.  7. History of significant asthma.  8. Hyperlipidemia.      Cyndia Bent, N.P.      Richard A. Rollene Fare, M.D.  Electronically Signed    BB/MEDQ  D:  02/19/2006  T:  02/19/2006  Job:  [id number]   cc:   Frann Rider, M.D.

## 2010-06-28 NOTE — Discharge Summary (Signed)
[place]  Patient:    [name], [name]                      MRN: [id number] Adm. Date:  [id number] Disc. Date: [id number] Attending:  Epifanio Lesches Dictator:   Verdell Carmine CC:         Robyn Haber, M.D.             Odette Fraction, M.D.             [facility], Attn. Dr. Claiborne Billings                           Discharge Summary  DATE OF BIRTH: [birth year]  PRIMARY CARE PHYSICIAN: Robyn Haber, M.D.  CONSULTATIONS: Dr. Claiborne Billings.  PROCEDURE: Cardiac catheterization, which showed no disease.  DISCHARGE DIAGNOSES:  1. Right bundle branch block with Mobitz type 2 arrhythmia, needing     pacemaker procedure.  2. Asthma.  3. Hypertension.  4. Hypercholesterolemia.  5. Right shoulder pain, felt to be possibly bicipital tendonitis.  DISCHARGE MEDICATIONS:  1. To take no aspirin, Plavix, or Vioxx.  2. Remain on Norvasc 5 mg p.o. q.d.  3. Remain on Accolate 20 mg p.o. b.i.d.  DISCHARGE ACTIVITY: No work or driving until after the pacemaker procedure.  FOLLOW-UP: To follow up with [place] on June 04, 1999 at 10 a.m., and will follow up with [place], who will call her to schedule her pacemaker appointment.  HISTORY OF PRESENT ILLNESS: This patient is a 65 year old female with no known coronary disease, who presented to her primary M.D.s office with complaint of dyspnea and left-sided chest pain for two to three days, which occurred at walking and which was intermittent in nature and would go away with rest.  She has had nausea and diaphoresis, with radiation of the pain to the left arm. She has had some dizziness but no syncope, and describes the pain as a pressure sensation on top of her chest.  She says that the pain was relieved by the sublingual nitroglycerin which she received in the ED.  She has had positive paroxysmal nocturnal dyspnea as well as dyspnea  on exertion.  She had a normal cardiac catheterization about seven years ago performed by Dr. Melvern Banker.  PAST MEDICAL HISTORY:  1. Obesity.  2. Bronchospasm.  3. Tobacco abuse.  4. Hypertension.  5. Right shoulder pain.  MEDICATIONS:  1. Norvasc.  2. Vioxx.  3. Accolate.  4. Tylenol.  Please see her complete History and Physical for further history.  PHYSICAL EXAMINATION:  VITAL SIGNS: Blood pressure 156/86, heart rate 84.  Oxygen saturation was 97% on room air.  Respiratory rate was 18.  CARDIAC: Regular rate and rhythm with no murmur.  NECK: No lymphadenopathy or JVD.  CHEST: Clear to auscultation bilaterally with no wheezing.  ABDOMEN: Obese, nontender, nondistended, with positive bowel sounds.  No hepatosplenomegaly.  EXTREMITIES: Lower extremities without edema.  RECTAL: Guaiac negative.  LABORATORY DATA: Chest x-ray on admission from her primary M.D. showed no infiltrate.  EKG showed normal sinus rhythm with first degree AV block and right bundle branch block; Q waves in the lateral leads as well as small Q waves in the inferior leads.  Admission laboratory data was not impressive, with negative initial set of cardiac enzymes and negative troponin.  HOSPITAL COURSE: She was admitted to [facility] for chest pain and a new Mobitz type 2 block on her EKG.  She was started on heparin and a nitrate drip and aspirin.  She was ruled out for acute MI with serial enzymes and EKGs.  Her three sets of enzymes remained negative.  A TSH was obtained secondary to the new arrhythmia, which was within normal limits.  Fasting lipid panel showed her cholesterol to be elevated at 314, triglycerides to be 83, HDL 74, LDL also elevated at 223.  She will therefore need to be started on hyperlipidemia therapy as an outpatient.  On May 17, 1999 the patient underwent a cardiac catheterization, which showed a normal left ventricular function and no significant  cerebrovascular disease.  A 2D echocardiogram was performed on May 17, 1999 and was a technically difficult study, with normal left ventricular function and questionably impaired left ventricular diastolic relaxation.  The left atrium was noted to be mildly dilated.  She was initially scheduled for pacemaker placement on May 21, 1999; however, the patient inadvertently been continued on her aspirin and Plavix and therefore the pacemaker was unable to be placed during this hospitalization.  She was therefore discharged home as she was felt to be medically stable and her pacemaker will be placed at a further date to be determined by [place]. DD:  06/21/99 TD:  06/24/99 Job: [id number]

## 2010-10-13 ENCOUNTER — Encounter: Payer: Self-pay | Admitting: Emergency Medicine

## 2010-10-13 ENCOUNTER — Other Ambulatory Visit: Payer: Self-pay

## 2010-10-13 ENCOUNTER — Emergency Department (HOSPITAL_BASED_OUTPATIENT_CLINIC_OR_DEPARTMENT_OTHER)
Admission: EM | Admit: 2010-10-13 | Discharge: 2010-10-13 | Disposition: A | Discharge status: Other Acute Inpatient Hospital | Payer: Medicare Other | Source: Home / Self Care | Attending: Emergency Medicine | Admitting: Emergency Medicine

## 2010-10-13 ENCOUNTER — Emergency Department (INDEPENDENT_AMBULATORY_CARE_PROVIDER_SITE_OTHER): Payer: Medicare Other

## 2010-10-13 ENCOUNTER — Emergency Department (HOSPITAL_BASED_OUTPATIENT_CLINIC_OR_DEPARTMENT_OTHER): Payer: Medicare Other

## 2010-10-13 ENCOUNTER — Observation Stay (HOSPITAL_COMMUNITY)
Admission: AD | Admit: 2010-10-13 | Discharge: 2010-10-14 | DRG: 552 | Disposition: A | Discharge status: Home / Self Care | Payer: Medicare Other | Source: Other Acute Inpatient Hospital | Attending: Cardiology | Admitting: Cardiology

## 2010-10-13 DIAGNOSIS — I509 Heart failure, unspecified: Secondary | ICD-10-CM | POA: Insufficient documentation

## 2010-10-13 DIAGNOSIS — R5381 Other malaise: Secondary | ICD-10-CM | POA: Insufficient documentation

## 2010-10-13 DIAGNOSIS — J45909 Unspecified asthma, uncomplicated: Secondary | ICD-10-CM | POA: Insufficient documentation

## 2010-10-13 DIAGNOSIS — I2510 Atherosclerotic heart disease of native coronary artery without angina pectoris: Secondary | ICD-10-CM | POA: Insufficient documentation

## 2010-10-13 DIAGNOSIS — Z950 Presence of cardiac pacemaker: Secondary | ICD-10-CM | POA: Insufficient documentation

## 2010-10-13 DIAGNOSIS — R079 Chest pain, unspecified: Secondary | ICD-10-CM | POA: Insufficient documentation

## 2010-10-13 DIAGNOSIS — G4733 Obstructive sleep apnea (adult) (pediatric): Secondary | ICD-10-CM | POA: Insufficient documentation

## 2010-10-13 DIAGNOSIS — Z95810 Presence of automatic (implantable) cardiac defibrillator: Secondary | ICD-10-CM

## 2010-10-13 DIAGNOSIS — I10 Essential (primary) hypertension: Secondary | ICD-10-CM | POA: Insufficient documentation

## 2010-10-13 DIAGNOSIS — M549 Dorsalgia, unspecified: Secondary | ICD-10-CM | POA: Insufficient documentation

## 2010-10-13 DIAGNOSIS — I428 Other cardiomyopathies: Secondary | ICD-10-CM | POA: Insufficient documentation

## 2010-10-13 DIAGNOSIS — Z23 Encounter for immunization: Secondary | ICD-10-CM | POA: Insufficient documentation

## 2010-10-13 DIAGNOSIS — R0602 Shortness of breath: Secondary | ICD-10-CM | POA: Insufficient documentation

## 2010-10-13 DIAGNOSIS — E785 Hyperlipidemia, unspecified: Secondary | ICD-10-CM | POA: Insufficient documentation

## 2010-10-13 DIAGNOSIS — E669 Obesity, unspecified: Secondary | ICD-10-CM | POA: Insufficient documentation

## 2010-10-13 HISTORY — DX: Essential (primary) hypertension: I10

## 2010-10-13 HISTORY — DX: Unspecified osteoarthritis, unspecified site: M19.90

## 2010-10-13 HISTORY — DX: Heart failure, unspecified: I50.9

## 2010-10-13 LAB — BASIC METABOLIC PANEL
BUN: 21 mg/dL (ref 6–23)
CO2: 29 mEq/L (ref 19–32)
Calcium: 9.7 mg/dL (ref 8.4–10.5)
Chloride: 100 mEq/L (ref 96–112)
Creatinine, Ser: 1 mg/dL (ref 0.50–1.10)
GFR calc Af Amer: >60 mL/min (ref >60)
GFR calc non Af Amer: 55 mL/min — ABNORMAL LOW (ref >60)
Glucose, Bld: 90 mg/dL (ref 70–99)
Potassium: 4.1 mEq/L (ref 3.5–5.1)
Sodium: 138 mEq/L (ref 135–145)

## 2010-10-13 LAB — PRO B NATRIURETIC PEPTIDE: Pro B Natriuretic peptide (BNP): 475.9 pg/mL — ABNORMAL HIGH (ref 0–125)

## 2010-10-13 LAB — CBC
HCT: 43.4 % (ref 36.0–46.0)
Hemoglobin: 13.7 g/dL (ref 12.0–15.0)
MCH: 26 pg (ref 26.0–34.0)
MCHC: 31.6 g/dL (ref 30.0–36.0)
MCV: 82.5 fL (ref 78.0–100.0)
Platelets: 176 10*3/uL (ref 150–400)
RBC: 5.26 MIL/uL — ABNORMAL HIGH (ref 3.87–5.11)
RDW: 15.5 % (ref 11.5–15.5)
WBC: 6.5 10*3/uL (ref 4.0–10.5)

## 2010-10-13 LAB — TROPONIN I
Troponin I: <0.3 ng/mL (ref <0.30)
Troponin I: <0.3 ng/mL (ref <0.30)

## 2010-10-13 MED ORDER — SODIUM CHLORIDE 0.9 % IJ SOLN
3.0000 mL | Freq: Two times a day (BID) | INTRAMUSCULAR | Status: DC
  Administered 2010-10-13: 3 mL via INTRAVENOUS
  Filled 2010-10-13: qty 3

## 2010-10-13 MED ORDER — SODIUM CHLORIDE 0.9 % IV SOLN
250.0000 mL | INTRAVENOUS | Status: DC
  Administered 2010-10-13: 250 mL via INTRAVENOUS

## 2010-10-13 MED ORDER — NITROGLYCERIN 2 % TD OINT
1.0000 [in_us] | TOPICAL_OINTMENT | Freq: Once | TRANSDERMAL | Status: AC
  Administered 2010-10-13: 1 [in_us] via TOPICAL

## 2010-10-13 MED ORDER — NITROGLYCERIN 2 % TD OINT
TOPICAL_OINTMENT | TRANSDERMAL | Status: AC
  Administered 2010-10-13: 1 [in_us] via TOPICAL
  Filled 2010-10-13: qty 1

## 2010-10-13 MED ORDER — SODIUM CHLORIDE 0.9 % IJ SOLN
3.0000 mL | INTRAMUSCULAR | Status: DC | PRN
  Filled 2010-10-13: qty 3

## 2010-10-13 MED ORDER — ASPIRIN 81 MG PO CHEW
324.0000 mg | CHEWABLE_TABLET | Freq: Once | ORAL | Status: AC
  Administered 2010-10-13: 324 mg via ORAL
  Filled 2010-10-13: qty 4

## 2010-10-13 MED ORDER — NITROGLYCERIN 0.4 MG SL SUBL
0.4000 mg | SUBLINGUAL_TABLET | SUBLINGUAL | Status: AC | PRN
  Administered 2010-10-13 (×3): 0.4 mg via SUBLINGUAL
  Filled 2010-10-13: qty 25

## 2010-10-13 NOTE — ED Notes (Signed)
Transported by [facility] to [facility] 2013

## 2010-10-13 NOTE — ED Notes (Signed)
Report given to [facility]. 

## 2010-10-13 NOTE — ED Notes (Signed)
[facility] here to transport pt to 2013

## 2010-10-13 NOTE — ED Notes (Signed)
Pt states she is having left sided chest pain over left breast radiating to back, scapula area.  Pt denies N/V.  Pain worsening with movement.  Pt seen at MD office on Friday and sent home with pain medication.  Some SOB with exertion.  Some diaphoresis on Friday and Saturday.  Pt states her doctor wanted pt to have a CT scan yesterday but pt unable to come in.

## 2010-10-13 NOTE — ED Notes (Signed)
Patient given blankets for comfort measures

## 2010-10-13 NOTE — ED Provider Notes (Addendum)
History     CSN: [id number] Arrival date & time: 10/13/2010 11:18 AM  Chief Complaint  Patient presents with  . Chest Pain  . Back Pain   HPI Comments: Patient presents to emergency department today complaining of several weeks of chest and back pain. She states that she saw her primary care physician on Friday who [facility] some type of a [facility] scan but apparently did not fax over the appropriate request and so patient did not have the study completed. Patient presents today for further evaluation of her symptoms. She denies any fevers, cough, nausea, diaphoresis. Patient denies any specific inciting or relieving factors for her pain. It does not seem to change with exertion. She states her doctor did give her some pain pills which help somewhat but when they wear off her pain is still there. Patient has not noted any palpitations and states she did have her pacemaker checked by Dr. Rollene Fare 2 weeks ago and was told it was fine. Patient states she has not had this pain previously. And she denies having any coronary artery disease.  Patient is a 65 y.o. female presenting with chest pain and back pain. The history is provided by the patient. No language interpreter was used.  Chest Pain The chest pain began more than 2 weeks ago. The chest pain is unchanged. The pain radiates to the upper back. Primary symptoms include fatigue and shortness of breath. Pertinent negatives for primary symptoms include no fever, no syncope, no cough, no wheezing, no palpitations, no abdominal pain, no nausea, no vomiting, no dizziness and no altered mental status. She tried narcotics for the symptoms.    Back Pain  Associated symptoms include chest pain. Pertinent negatives include no fever, no headaches, no abdominal pain and no dysuria.    Past Medical History  Diagnosis Date  . Asthma   . Hypertension   . Irregular heart rate   . Arthritis   . CHF (congestive heart failure)      Past Surgical History  Procedure Date  . Pacemaker insertion   . Cardiac defibrillator placement     History reviewed. No pertinent family history.  History  Substance Use Topics  . Smoking status: Never Smoker   . Smokeless tobacco: Not on file  . Alcohol Use: No    OB History    Grav Para Term Preterm Abortions TAB SAB Ect Mult Living                  Review of Systems  Constitutional: Positive for fatigue. Negative for fever and chills.  HENT: Negative.   Eyes: Negative.  Negative for discharge and redness.  Respiratory: Positive for shortness of breath. Negative for cough and wheezing.   Cardiovascular: Positive for chest pain. Negative for palpitations and syncope.  Gastrointestinal: Negative.  Negative for nausea, vomiting, abdominal pain and diarrhea.  Genitourinary: Negative.  Negative for dysuria and vaginal discharge.  Musculoskeletal: Positive for back pain.  Skin: Negative.  Negative for color change and rash.  Neurological: Negative.  Negative for dizziness, syncope and headaches.  Hematological: Negative.  Negative for adenopathy.  Psychiatric/Behavioral: Negative.  Negative for confusion and altered mental status.  All other systems reviewed and are negative.    Physical Exam  BP 169/72  Pulse 66  Temp(Src) 98.3 F (36.8 C) (Oral)  Resp 20  Ht 5\' 6"  (1.676 m)  Wt 224 lb (101.606 kg)  BMI 36.15 kg/m2  SpO2  95%  Physical Exam  Constitutional: She is oriented to person, place, and time. She appears well-developed and well-nourished.  Non-toxic appearance. She does not have a sickly appearance.  HENT:  Head: Normocephalic and atraumatic.  Eyes: Conjunctivae, EOM and lids are normal. Pupils are equal, round, and reactive to light. No scleral icterus.  Neck: Trachea normal and normal range of motion. Neck supple.  Cardiovascular: Normal rate, regular rhythm, S1 normal, S2 normal and normal heart sounds.  Exam reveals no gallop, no distant heart  sounds and no friction rub.   Pulmonary/Chest: Effort normal and breath sounds normal. No accessory muscle usage. No respiratory distress. She has no decreased breath sounds. She has no wheezes. She has no rhonchi. She has no rales.  Abdominal: Soft. Normal appearance. There is no tenderness. There is no rebound, no guarding and no CVA tenderness.  Musculoskeletal: Normal range of motion.  Neurological: She is alert and oriented to person, place, and time. She has normal strength.  Skin: Skin is warm, dry and intact. No rash noted.  Psychiatric: She has a normal mood and affect. Her behavior is normal. Judgment and thought content normal.    ED Course  Procedures  MDM  Date: 10/13/2010  Rate: 66  Rhythm: Atrial ventricular paced rhythm  QRS Axis: left  Intervals: normal  ST/T Wave abnormalities: normal  Conduction Disutrbances:Paced rhythm  Narrative Interpretation:   Old EKG Reviewed: changes noted from 02/19/2008 when patient did not have a pacemaker   Patient is noted here to have improvement in her chest pain with reasonable nitroglycerin. Due to this we'll place some nitro paste on her chest. She has negative cardiac markers but I'm concerned given her improvement with the nitroglycerin but this could be ACS. She has no past history of coronary artery disease but does have risk factors. There was some possible consideration to aortic dissection but that seems less likely given her back pain with this has been going on for weeks. Patient is also allergic to IV dye this point in time and so I'm unable to obtain the appropriate study at this time and given that I feel that this is not her likely process at this time this is something that could be further evaluated on her admission. I have discussed admission of this patient to [facility] with Dr. Ellyn Hack who is covering for Dr. Rollene Fare. Patient has no chest pain at this time is stable for transfer. She has some mild elevation in  her BNP concerning for mild CHF but her chest x-ray shows no pulmonary edema and patient is not hypoxic at this time.     Lezlie Octave, MD 10/13/10 [phone]

## 2010-10-13 NOTE — ED Notes (Signed)
Attempt to call report to 2000- RN will call back when able to take report- [facility] has assumed care of pt at this time

## 2010-10-14 LAB — CK TOTAL AND CKMB (NOT AT ARMC)
CK, MB: 3.5 ng/mL (ref 0.3–4.0)
Relative Index: 2.1 (ref 0.0–2.5)
Total CK: 164 U/L (ref 7–177)

## 2010-10-14 LAB — TSH: TSH: 1.777 u[IU]/mL (ref 0.350–4.500)

## 2010-10-14 LAB — HEMOGLOBIN A1C
Hgb A1c MFr Bld: 6.1 % — ABNORMAL HIGH (ref <5.7)
Mean Plasma Glucose: 128 mg/dL — ABNORMAL HIGH (ref <117)

## 2010-10-23 NOTE — H&P (Signed)
NAME:  LISANNE, COBB NO.:  [id number]  MEDICAL RECORD NO.:  [id number]  LOCATION:  2013                         FACILITY:  [facility]  PHYSICIAN:  Shelva Majestic, M.D.     DATE OF BIRTH:  18-Jan-1946  DATE OF ADMISSION:  10/13/2010 DATE OF DISCHARGE:                             HISTORY & PHYSICAL   CARDIOLOGIST:  Delfino Lovett A. Rollene Fare, MD  GI DOCTOR:  Lowella Bandy. Olevia Perches, MD  PRIMARY DOCTOR:  Frann Rider, MD  CHIEF COMPLAINT:  Chest pain and back pain.  HISTORY OF PRESENT ILLNESS:  Ms. Menzie is a 65 year old obese Caucasian female with a history of nonobstructive coronary disease by cath in January 2010 with an ejection fraction at that time of 60%.  She also has a history of pacemaker and ICD, irritable bowel syndrome, obstructive sleep apnea for which she does not use CPAP, hypertension, hyperlipidemia, asthma, C-spine vertebral fracture approximately 3 years ago.  She comes in on the 2nd with complains of back pain and chest pain.  She stated she went to see her primary care doctor on Friday who programmed her some pain medication.  She stated on Saturday is getting worse and Sunday more worse. Stating it was sharp pain and was 9/10 in intensity.  She stated that it comes and goes, was associated with shortness of breath, left arm pain.  She also complained of spotlights flashing her eyes and abdominal pain which is 5/10.  She also has complaint of polyuria.  She denies any nausea, vomiting, diaphoresis, dysuria, hematuria, hematochezia, or melena.  She states her last bowel movement was yesterday and was normal for her.  She had presented originally at [facility] and was transferred yesterday to [facility].  MEDICATIONS: 1. Zyrtec 10 mg 1 tablet daily. 2. Spironolactone 25 mg 1/2 tablet twice daily. 3. Klor-Con 20 mEq 1 tablet twice daily. 4. Furosemide 40 mg 1 tablet twice daily. 5. Symbicort 80/4.5 mcg inhaled 2 puffs twice daily. 6. Crestor  10 mg 1 tablet daily. 7. Fish oil 1000 mg 2 capsules daily. 8. Aspirin 81 mg 1 tablet daily. 9. Buspirone 5 mg 1 tablet twice daily. 10.Losartan 100 mg 1 tablet daily. 11.Colace 100 mg 1-2 capsules daily at bedtime as needed. 12.Tramadol 50 mg 1 tab evert 6 hours as needed for pain. 13.Dexilant 60 mg 1 capsule daily.  ALLERGIES:  To SULFA and CONTRAST MEDIA.  PAST MEDICAL HISTORY: 1. Permanent pacemaker and Bi-V upgrade for progressive LV     dysfunction. 2. Pacer-induced left bundle. 3. Chronic asthma. 4. Obesity. 5. Positive tobacco history, but quit 20 years ago. 6. Cervical neck vertebral fracture approximately 3 years ago. 7. Irritable bowel syndrome. 8. Obstructive sleep apnea. 9. Hypertension. 10.Hyperlipidemia.  SOCIAL HISTORY:  The patient currently works at the [employer].  She has been laid off for a few months while they do some remodelling.  Has quit smoking approximately 20-30 years ago.  She has 5 boys, all smoked but one and 6 great-grandchildren.  She has been married 64 years.  She denies alcohol use and walks approximately 2-3 per week.  FAMILY HISTORY:  Noncontributory.  REVIEW OF SYSTEMS:  As  per HPI.  PHYSICAL EXAM:  VITAL SIGNS:  Blood pressure is 142/72, temperature 98.2, pulse of 68, respirations 18, O2 saturations 98% on room air. GENERAL:  Ms. Godard is a 65 year old obese Caucasian female.  She is resting comfortably, lying flat in bed on her left side, in no apparent distress. HEENT:  Pupils were equal, round, and reactive to light and accommodation.  Extraocular movements were intact.  There is no scleral icterus. NECK:  Obese, nontender.  Negative lymphadenopathy.  JVD was unknown or indistinguishable. CARDIOVASCULAR:  Regular rate and rhythm.  Negative murmurs, rubs, or gallops. PULMONARY:  Clear to auscultation bilaterally.  Negative wheezes or rhonchi. ABDOMEN:  Positive bowel sounds in all quadrants.  Soft,  minimal tenderness. PERIPHERAL VASCULAR:  2+ radial pulses, 2+ dorsalis pedis pulses, 2+ posterior tibialis pulses.  Negative lower extremity edema.  Has a right- sided carotid bruit noted. NEUROLOGIC:  The patient is alert and oriented x3.  Strength is 5/5, equal bilaterally in upper and lower extremities. MUSCULOSKELETAL:  The patient's left mid scapular thoracic region is tender to palpation.  She also notes increased pain with inspiration. Her chest is tender to palpation in the sternal region.  She also notes pain with rotation.  LABORATORY DATA:  WBC 6.5, hemoglobin 13.7, hematocrit 43.4, platelets 176.  Sodium 138, potassium 4.1, chloride 100, carbon dioxide 29, glucose 90, BUN 21, creatinine 1.00, calcium 9.7.  Troponin is #1 was less than 0.30, #2 was less than 0.30.  Her BMP was 475.9.  Chest x-ray shows borderline enlargement of the heart.  Tortuosity, ectasia, and calcification of thoracic aorta.  Lungs were clear, no pleural effusions.  Pulmonary thorax is intact.  Stable degenerative changes involving thoracic spine.  No acute cardiopulmonary findings.  IMPRESSION: 1. Chest pain/back pain.  Initial troponins were negative.  We will     check CK-MB and the final troponin.  We will get an EKG this a.m. 2. Hypertension. 3. Hyperlipidemia. 4. Chronic asthma. 5. History of irritable bowel syndrome. 6. Obstructive sleep apnea which she is now using CPAP.  PLAN:  The patient was admitted on September 02.  Cardiac enzymes were cycled.  We will check CK-MB, in addition to the troponin, repeat EKG. She is on topical nitrates currently.  We will implement cardiac p.r.n. medications and check a TSH and hemoglobin A1c and fasting lipids.  She will be started on heart-healthy diet.  Her home medications will be continued.  I will rule her out for ACS.  It is likely her chest pain and back pain is related to musculoskeletal.  To provide some ibuprofen and manage her  pain.    ______________________________ Tarri Fuller, PA   ______________________________ Shelva Majestic, M.D.    BH/MEDQ  D:  10/14/2010  T:  10/14/2010  Job:  [id number]  cc:   Delfino Lovett A. Rollene Fare, M.D. Frann Rider, M.D. Lowella Bandy. Olevia Perches, MD  Electronically Signed by Tarri Fuller PA on 10/15/2010 10:34:38 AM Electronically Signed by Shelva Majestic M.D. on 10/23/2010 12:10:38 PM

## 2010-10-23 NOTE — Discharge Summary (Signed)
NAMEMarland Kitchen  KERSEY, CZYZEWSKI NO.:  [id number]  MEDICAL RECORD NO.:  [id number]  LOCATION:  2013                         FACILITY:  [facility]  PHYSICIAN:  Shelva Majestic, M.D.     DATE OF BIRTH:  [birth year]  DATE OF ADMISSION:  10/13/2010 DATE OF DISCHARGE:  10/14/2010                              DISCHARGE SUMMARY   DISCHARGE DIAGNOSES: 1. Chest pain and back pain.  EKG without acute changes.  CK-MB, CK     total, and troponins are negative. 2. Coronary artery disease, nonobstructive by catheterization in     January 2010. 3. Nonischemic cardiomyopathy, status post biventricular pacemaker and     implantable cardioverter defibrillator.  Ejection fraction by echo     in 2008 shows an ejection fraction of 15-20%.  When catheterized in     January 2000, her ejection fraction was 60%. 4. Obstructive sleep apnea, not using her CPAP. 5. Obesity. 6. History of irritable bowel syndrome. 7. Hypertension. 8. Hyperlipidemia. 9. Chronic asthma.  HOSPITAL COURSE:  Ms. Rackow is a 65 year old obese Caucasian female with a history of nonobstructive coronary artery disease by cath, January 2010 and ejection fraction of 60% at that time.  She also has a history of BiV pacer ICD, irritable bowel syndrome, obstructive sleep apnea for which she does not use a CPAP, hypertension, hyperlipidemia, asthma, C-spine vertebral fractures approximately 3 years ago.  She presented to Med Center [facility] yesterday with complaints of back and chest pain.  She states she has seen her primary care doctor on Friday who prescribed tramadol.  Pain got worse, so she presented to Med Center at [facility] yesterday.  She was subsequently transferred to [facility] to a telemetry unit.  Cardiac enzymes were checked, found to be negative, and EKG showed no acute changes. AV pacing at a rate 67 beats per minute.  We checked TSH, hemoglobin A1c, fasting lipids, started her on heart-healthy diet, and  continued her current home medications.  She indicated that this chest pain was more evident with breathing and movement and was tender to palpation.  This likely is musculoskeletal in origin and she will be continued on tramadol but given her GI issues in the past, we will not start any ibuprofen.  She will also take Tylenol p.r.n. as needed.  The patient has been seen by Dr. Claiborne Billings who feels she is stable for discharge home.  DISCHARGE LABORATORY DATA:  WBC 6.5, hemoglobin 13.7, hematocrit 43.4, and platelets 176.  Sodium 138, potassium 4.1, chloride 100, carbon dioxide 29, glucose 90, BUN 21, creatinine 1.00, and calcium 9.7.  CK total 164, CK-MB of 3.5, relative index 2.1, troponins were negative x2 and BNP initially 475.9.  STUDIES/PROCEDURES:  Chest x-ray, October 13, 2010 showed the heart is borderline enlarged but unchanged.  There is a tortuosity, ectasia calcification of thoracic aorta.  The lungs were clear with no pleural effusions.  Stable degenerative changes involving thoracic spine.  No acute cardiopulmonary findings.  DISCHARGE MEDICATIONS: 1. Acetaminophen 325 mg 2 tablets by mouth every 4 hours as needed for     pain. 2. Aspirin 81 mg 1 tablet by mouth daily. 3. Buspirone 5 mg 1  tablet by mouth twice daily. 4. Colace 100 mg 1-2 capsules by mouth daily at bedtime as needed for     constipation. 5. Crestor 10 mg 1 tablet by mouth daily. 6. Dexilant 60 mg 1 capsule by mouth daily. 7. Fish oil 1000 mg 3 capsules by mouth daily. 8. Furosemide 40 mg 1 tablet by mouth twice daily. 9. Klor-Con 20 mEq 1 tablet by mouth twice daily. 10.Losartan 100 mg 1 tablet by mouth daily. 11.Spironolactone 25 mg 1/2 tablet by mouth twice daily. 12.Symbicort 80/4.5 mcg 2 puffs inhaled twice daily. 13.Tramadol 50 mg 1 tablet by mouth every 6 hours as needed for pain. 14.Zyrtec 10 mg 1 tablet by mouth daily as needed for allergy     symptoms.  DISPOSITION:  Ms. Sullen will be  discharged home in stable conditions. It is recommend she eats a to low-sodium heart-healthy diet.  She will follow at [facility] for 2-D echocardiogram and then follow up with Dr. Rollene Fare thereafter and our office will call with both appointment times.    ______________________________ Tarri Fuller, PA   ______________________________ Shelva Majestic, M.D.    BH/MEDQ  D:  10/14/2010  T:  10/14/2010  Job:  [id number]  cc:   Delfino Lovett A. Rollene Fare, M.D. Frann Rider, M.D.  Electronically Signed by Tarri Fuller PA on 10/15/2010 10:34:46 AM Electronically Signed by Shelva Majestic M.D. on 10/23/2010 12:10:35 PM

## 2010-11-04 LAB — CBC
HCT: 36.4
HCT: 34.6 — ABNORMAL LOW
HCT: 36.7
Hemoglobin: 11.8 — ABNORMAL LOW
Hemoglobin: 11.2 — ABNORMAL LOW
Hemoglobin: 11.9 — ABNORMAL LOW
MCHC: 32.5
MCHC: 32.3
MCHC: 32.5
MCV: 77.6 — ABNORMAL LOW
MCV: 77.9 — ABNORMAL LOW
MCV: 78.4
Platelets: 246
Platelets: 235
Platelets: 279
RBC: 4.7
RBC: 4.44
RBC: 4.68
RDW: 17.3 — ABNORMAL HIGH
RDW: 16.6 — ABNORMAL HIGH
RDW: 16.9 — ABNORMAL HIGH
WBC: 6.7
WBC: 15 — ABNORMAL HIGH
WBC: 6.3

## 2010-11-04 LAB — CK TOTAL AND CKMB (NOT AT ARMC)
CK, MB: 3.4
Relative Index: 1.7
Total CK: 200 — ABNORMAL HIGH

## 2010-11-04 LAB — CARDIAC PANEL(CRET KIN+CKTOT+MB+TROPI)
CK, MB: 2.8
CK, MB: 2.2
Relative Index: 1.7
Relative Index: 1.5
Total CK: 163
Total CK: 149
Troponin I: 0.03
Troponin I: 0.02

## 2010-11-04 LAB — I-STAT 8, (EC8 V) (CONVERTED LAB)
BUN: 28 — ABNORMAL HIGH
Bicarbonate: 26.4 — ABNORMAL HIGH
Chloride: 107
Glucose, Bld: 119 — ABNORMAL HIGH
HCT: 39
Hemoglobin: 13.3
Operator id: 265201
Potassium: 4.3
Sodium: 138
TCO2: 28
pCO2, Ven: 48.5
pH, Ven: 7.345 — ABNORMAL HIGH

## 2010-11-04 LAB — PROTIME-INR
INR: 0.9
Prothrombin Time: 12.3

## 2010-11-04 LAB — POCT I-STAT 3, VENOUS BLOOD GAS (G3P V)
Acid-Base Excess: 4 — ABNORMAL HIGH
Bicarbonate: 28.8 — ABNORMAL HIGH
O2 Saturation: 65
Operator id: 256671
TCO2: 30
pCO2, Ven: 44.8 — ABNORMAL LOW
pH, Ven: 7.416 — ABNORMAL HIGH
pO2, Ven: 34

## 2010-11-04 LAB — LIPID PANEL
Cholesterol: 251 — ABNORMAL HIGH
HDL: 38 — ABNORMAL LOW
LDL Cholesterol: 190 — ABNORMAL HIGH
Total CHOL/HDL Ratio: 6.6
Triglycerides: 115
VLDL: 23

## 2010-11-04 LAB — BASIC METABOLIC PANEL
BUN: 32 — ABNORMAL HIGH
CO2: 24
Calcium: 9.1
Chloride: 103
Creatinine, Ser: 1.2
GFR calc Af Amer: 55 — ABNORMAL LOW
GFR calc non Af Amer: 45 — ABNORMAL LOW
Glucose, Bld: 166 — ABNORMAL HIGH
Potassium: 4.3
Sodium: 134 — ABNORMAL LOW

## 2010-11-04 LAB — POCT I-STAT 3, ART BLOOD GAS (G3+)
Acid-Base Excess: 1
Bicarbonate: 23.9
O2 Saturation: 95
Operator id: 256671
TCO2: 25
pCO2 arterial: 33.5 — ABNORMAL LOW
pH, Arterial: 7.461 — ABNORMAL HIGH
pO2, Arterial: 71 — ABNORMAL LOW

## 2010-11-04 LAB — COMPREHENSIVE METABOLIC PANEL
ALT: 34
AST: 28
Albumin: 3.6
Alkaline Phosphatase: 110
BUN: 26 — ABNORMAL HIGH
CO2: 25
Calcium: 9.4
Chloride: 100
Creatinine, Ser: 1.11
GFR calc Af Amer: >60
GFR calc non Af Amer: 50 — ABNORMAL LOW
Glucose, Bld: 117 — ABNORMAL HIGH
Potassium: 4
Sodium: 133 — ABNORMAL LOW
Total Bilirubin: 0.6
Total Protein: 7.5

## 2010-11-04 LAB — URINALYSIS, ROUTINE W REFLEX MICROSCOPIC
Bilirubin Urine: NEGATIVE
Glucose, UA: NEGATIVE
Hgb urine dipstick: NEGATIVE
Ketones, ur: NEGATIVE
Nitrite: NEGATIVE
Protein, ur: NEGATIVE
Specific Gravity, Urine: 1.016
Urobilinogen, UA: 0.2
pH: 5.5

## 2010-11-04 LAB — APTT: aPTT: 31

## 2010-11-04 LAB — POCT CARDIAC MARKERS
CKMB, poc: 2.5
Myoglobin, poc: 144
Operator id: 265201
Troponin i, poc: <0.05

## 2010-11-04 LAB — TSH: TSH: 2.576

## 2010-11-04 LAB — POCT I-STAT CREATININE
Creatinine, Ser: 1.3 — ABNORMAL HIGH
Operator id: 265201

## 2010-11-04 LAB — MAGNESIUM: Magnesium: 2.2

## 2010-11-04 LAB — TROPONIN I: Troponin I: 0.03

## 2010-11-06 LAB — BASIC METABOLIC PANEL
BUN: 21
CO2: 25
Calcium: 9.4
Chloride: 108
Creatinine, Ser: 0.94
GFR calc Af Amer: >60
GFR calc non Af Amer: >60
Glucose, Bld: 132 — ABNORMAL HIGH
Potassium: 4.2
Sodium: 137

## 2010-11-06 LAB — CBC
HCT: 37
Hemoglobin: 12
MCHC: 32.5
MCV: 79.8
Platelets: 218
RBC: 4.64
RDW: 15.9 — ABNORMAL HIGH
WBC: 6.3

## 2010-11-13 ENCOUNTER — Other Ambulatory Visit: Payer: Self-pay | Admitting: Family Medicine

## 2010-11-13 DIAGNOSIS — Z1231 Encounter for screening mammogram for malignant neoplasm of breast: Secondary | ICD-10-CM

## 2010-11-13 LAB — BASIC METABOLIC PANEL
BUN: 21
CO2: 27
Calcium: 9.5
Chloride: 102
Creatinine, Ser: 1.17
GFR calc Af Amer: 57 — ABNORMAL LOW
GFR calc non Af Amer: 47 — ABNORMAL LOW
Glucose, Bld: 134 — ABNORMAL HIGH
Potassium: 4
Sodium: 137

## 2010-11-13 LAB — CBC
HCT: 38.1
Hemoglobin: 12.3
MCHC: 32.3
MCV: 81
Platelets: 219
RBC: 4.7
RDW: 15.1
WBC: 6.5

## 2010-11-19 LAB — CLOTEST (H. PYLORI), BIOPSY: Helicobacter screen: NEGATIVE

## 2010-11-21 LAB — CBC
HCT: 33.9 — ABNORMAL LOW
HCT: 35.5 — ABNORMAL LOW
HCT: 35.7 — ABNORMAL LOW
HCT: 37.1
HCT: 39.4
HCT: 39.7
HCT: 40.4
Hemoglobin: 10.8 — ABNORMAL LOW
Hemoglobin: 11.3 — ABNORMAL LOW
Hemoglobin: 11.3 — ABNORMAL LOW
Hemoglobin: 11.7 — ABNORMAL LOW
Hemoglobin: 12.4
Hemoglobin: 12.7
Hemoglobin: 12.8
MCHC: 31.5
MCHC: 31.6
MCHC: 31.7
MCHC: 31.7
MCHC: 31.7
MCHC: 31.9
MCHC: 32
MCV: 76.3 — ABNORMAL LOW
MCV: 76.4 — ABNORMAL LOW
MCV: 76.5 — ABNORMAL LOW
MCV: 76.5 — ABNORMAL LOW
MCV: 77.3 — ABNORMAL LOW
MCV: 77.6 — ABNORMAL LOW
MCV: 77.7 — ABNORMAL LOW
Platelets: 221
Platelets: 238
Platelets: 239
Platelets: 241
Platelets: 242
Platelets: 255
Platelets: 261
RBC: 4.44
RBC: 4.6
RBC: 4.6
RBC: 4.78
RBC: 5.15 — ABNORMAL HIGH
RBC: 5.19 — ABNORMAL HIGH
RBC: 5.28 — ABNORMAL HIGH
RDW: 15.9 — ABNORMAL HIGH
RDW: 16 — ABNORMAL HIGH
RDW: 16.1 — ABNORMAL HIGH
RDW: 16.3 — ABNORMAL HIGH
RDW: 16.3 — ABNORMAL HIGH
RDW: 16.6 — ABNORMAL HIGH
RDW: 16.6 — ABNORMAL HIGH
WBC: 10
WBC: 10
WBC: 12 — ABNORMAL HIGH
WBC: 8
WBC: 9.1
WBC: 9.9
WBC: 9.9

## 2010-11-21 LAB — CK TOTAL AND CKMB (NOT AT ARMC)
CK, MB: 4.2 — ABNORMAL HIGH
Relative Index: 2.5
Total CK: 168

## 2010-11-21 LAB — POCT CARDIAC MARKERS
CKMB, poc: 1.2
CKMB, poc: 1.6
CKMB, poc: 3.1
Myoglobin, poc: 107
Myoglobin, poc: 184
Myoglobin, poc: 79.8
Operator id: 270111
Operator id: 277751
Operator id: 277751
Troponin i, poc: <0.05
Troponin i, poc: <0.05
Troponin i, poc: <0.05

## 2010-11-21 LAB — HEPARIN LEVEL (UNFRACTIONATED)
Heparin Unfractionated: 0.16 — ABNORMAL LOW
Heparin Unfractionated: 0.47
Heparin Unfractionated: 0.66

## 2010-11-21 LAB — URINE MICROSCOPIC-ADD ON

## 2010-11-21 LAB — DIFFERENTIAL
Basophils Absolute: 0
Basophils Absolute: 0.1
Basophils Relative: 0
Basophils Relative: 0
Eosinophils Absolute: 0
Eosinophils Absolute: 0.1
Eosinophils Relative: 0
Eosinophils Relative: 1
Lymphocytes Relative: 19
Lymphocytes Relative: 7 — ABNORMAL LOW
Lymphs Abs: 0.7
Lymphs Abs: 2.2
Monocytes Absolute: 0.3
Monocytes Absolute: 1.1 — ABNORMAL HIGH
Monocytes Relative: 3
Monocytes Relative: 9
Neutro Abs: 8.4 — ABNORMAL HIGH
Neutro Abs: 8.9 — ABNORMAL HIGH
Neutrophils Relative %: 71
Neutrophils Relative %: 90 — ABNORMAL HIGH

## 2010-11-21 LAB — I-STAT 8, (EC8 V) (CONVERTED LAB)
Acid-Base Excess: 1
Acid-Base Excess: 3 — ABNORMAL HIGH
BUN: 26 — ABNORMAL HIGH
BUN: 32 — ABNORMAL HIGH
Bicarbonate: 27 — ABNORMAL HIGH
Bicarbonate: 27.7 — ABNORMAL HIGH
Chloride: 103
Chloride: 107
Glucose, Bld: 118 — ABNORMAL HIGH
Glucose, Bld: 151 — ABNORMAL HIGH
HCT: 41
HCT: 45
Hemoglobin: 13.9
Hemoglobin: 15.3 — ABNORMAL HIGH
Operator id: 270111
Operator id: 277751
Potassium: 3.7
Potassium: 4.1
Sodium: 138
Sodium: 144
TCO2: 28
TCO2: 29
pCO2, Ven: 39.7 — ABNORMAL LOW
pCO2, Ven: 46.5
pH, Ven: 7.372 — ABNORMAL HIGH
pH, Ven: 7.452 — ABNORMAL HIGH

## 2010-11-21 LAB — POCT I-STAT CREATININE
Creatinine, Ser: 1
Creatinine, Ser: 1.1
Operator id: 270111
Operator id: 277751

## 2010-11-21 LAB — BASIC METABOLIC PANEL
BUN: 24 — ABNORMAL HIGH
BUN: 29 — ABNORMAL HIGH
BUN: 30 — ABNORMAL HIGH
BUN: 33 — ABNORMAL HIGH
CO2: 31
CO2: 31
CO2: 32
CO2: 33 — ABNORMAL HIGH
Calcium: 8.6
Calcium: 9
Calcium: 9
Calcium: 9.1
Chloride: 100
Chloride: 103
Chloride: 105
Chloride: 99
Creatinine, Ser: 0.95
Creatinine, Ser: 1.08
Creatinine, Ser: 1.09
Creatinine, Ser: 1.15
GFR calc Af Amer: 58 — ABNORMAL LOW
GFR calc Af Amer: >60
GFR calc Af Amer: >60
GFR calc Af Amer: >60
GFR calc non Af Amer: 48 — ABNORMAL LOW
GFR calc non Af Amer: 51 — ABNORMAL LOW
GFR calc non Af Amer: 51 — ABNORMAL LOW
GFR calc non Af Amer: 60 — ABNORMAL LOW
Glucose, Bld: 102 — ABNORMAL HIGH
Glucose, Bld: 114 — ABNORMAL HIGH
Glucose, Bld: 138 — ABNORMAL HIGH
Glucose, Bld: 85
Potassium: 3.3 — ABNORMAL LOW
Potassium: 3.7
Potassium: 4
Potassium: 4.2
Sodium: 138
Sodium: 138
Sodium: 139
Sodium: 143

## 2010-11-21 LAB — URINE CULTURE: Colony Count: 30000

## 2010-11-21 LAB — COMPREHENSIVE METABOLIC PANEL
ALT: 50 — ABNORMAL HIGH
AST: 31
Albumin: 3.3 — ABNORMAL LOW
Alkaline Phosphatase: 100
BUN: 24 — ABNORMAL HIGH
CO2: 28
Calcium: 8.8
Chloride: 108
Creatinine, Ser: 0.87
GFR calc Af Amer: >60
GFR calc non Af Amer: >60
Glucose, Bld: 122 — ABNORMAL HIGH
Potassium: 3.8
Sodium: 142
Total Bilirubin: 0.6
Total Protein: 6

## 2010-11-21 LAB — LIPID PANEL
Cholesterol: 236 — ABNORMAL HIGH
Cholesterol: 250 — ABNORMAL HIGH
HDL: 64
HDL: 64
LDL Cholesterol: 158 — ABNORMAL HIGH
LDL Cholesterol: 163 — ABNORMAL HIGH
Total CHOL/HDL Ratio: 3.7
Total CHOL/HDL Ratio: 3.9
Triglycerides: 115
Triglycerides: 70
VLDL: 14
VLDL: 23

## 2010-11-21 LAB — URINALYSIS, ROUTINE W REFLEX MICROSCOPIC
Bilirubin Urine: NEGATIVE
Glucose, UA: NEGATIVE
Hgb urine dipstick: NEGATIVE
Ketones, ur: NEGATIVE
Nitrite: NEGATIVE
Protein, ur: NEGATIVE
Specific Gravity, Urine: 1.025
Urobilinogen, UA: 1
pH: 6

## 2010-11-21 LAB — MAGNESIUM
Magnesium: 2
Magnesium: 2.2
Magnesium: 2.3

## 2010-11-21 LAB — PROTIME-INR
INR: 1
INR: 1
Prothrombin Time: 13
Prothrombin Time: 13.4

## 2010-11-21 LAB — TSH: TSH: 0.772

## 2010-11-21 LAB — D-DIMER, QUANTITATIVE
D-Dimer, Quant: 1.43 — ABNORMAL HIGH
D-Dimer, Quant: 3.7 — ABNORMAL HIGH

## 2010-11-21 LAB — B-NATRIURETIC PEPTIDE (CONVERTED LAB)
Pro B Natriuretic peptide (BNP): 1111 — ABNORMAL HIGH
Pro B Natriuretic peptide (BNP): 200 — ABNORMAL HIGH

## 2010-11-21 LAB — CARDIAC PANEL(CRET KIN+CKTOT+MB+TROPI)
CK, MB: 3.3
CK, MB: 4.1 — ABNORMAL HIGH
Relative Index: 2.8 — ABNORMAL HIGH
Relative Index: 2.9 — ABNORMAL HIGH
Total CK: 115
Total CK: 149
Troponin I: 0.02
Troponin I: 0.03

## 2010-11-21 LAB — APTT: aPTT: 28

## 2010-11-21 LAB — TROPONIN I: Troponin I: 0.03

## 2010-11-26 ENCOUNTER — Ambulatory Visit (HOSPITAL_COMMUNITY): Payer: Medicare Other

## 2010-12-03 ENCOUNTER — Ambulatory Visit (HOSPITAL_COMMUNITY): Payer: Medicare Other

## 2010-12-19 ENCOUNTER — Ambulatory Visit (HOSPITAL_COMMUNITY)
Admission: RE | Admit: 2010-12-19 | Discharge: 2010-12-19 | Disposition: A | Discharge status: Home / Self Care | Payer: Medicare Other | Source: Ambulatory Visit | Attending: Family Medicine | Admitting: Family Medicine

## 2010-12-19 DIAGNOSIS — Z1231 Encounter for screening mammogram for malignant neoplasm of breast: Secondary | ICD-10-CM | POA: Insufficient documentation

## 2011-01-01 ENCOUNTER — Other Ambulatory Visit: Payer: Self-pay | Admitting: Family Medicine

## 2011-01-01 DIAGNOSIS — R928 Other abnormal and inconclusive findings on diagnostic imaging of breast: Secondary | ICD-10-CM

## 2011-01-30 ENCOUNTER — Other Ambulatory Visit: Payer: Medicare Other

## 2011-02-10 ENCOUNTER — Ambulatory Visit
Admission: RE | Admit: 2011-02-10 | Discharge: 2011-02-10 | Disposition: A | Discharge status: Home / Self Care | Payer: Medicare Other | Source: Ambulatory Visit | Attending: Family Medicine | Admitting: Family Medicine

## 2011-02-10 DIAGNOSIS — R928 Other abnormal and inconclusive findings on diagnostic imaging of breast: Secondary | ICD-10-CM

## 2011-03-05 ENCOUNTER — Other Ambulatory Visit: Payer: Self-pay | Admitting: Nurse Practitioner

## 2011-05-05 ENCOUNTER — Encounter (HOSPITAL_COMMUNITY): Payer: Self-pay

## 2011-05-05 ENCOUNTER — Emergency Department (HOSPITAL_COMMUNITY)
Admission: EM | Admit: 2011-05-05 | Discharge: 2011-05-05 | Disposition: A | Discharge status: Home / Self Care | Payer: Medicare Other | Source: Home / Self Care | Attending: Emergency Medicine | Admitting: Emergency Medicine

## 2011-05-05 ENCOUNTER — Emergency Department (INDEPENDENT_AMBULATORY_CARE_PROVIDER_SITE_OTHER): Payer: Medicare Other

## 2011-05-05 DIAGNOSIS — R059 Cough, unspecified: Secondary | ICD-10-CM

## 2011-05-05 DIAGNOSIS — R05 Cough: Secondary | ICD-10-CM

## 2011-05-05 DIAGNOSIS — R053 Chronic cough: Secondary | ICD-10-CM

## 2011-05-05 LAB — POCT I-STAT, CHEM 8
BUN: 26 mg/dL — ABNORMAL HIGH (ref 6–23)
Calcium, Ion: 1.22 mmol/L (ref 1.12–1.32)
Chloride: 106 mEq/L (ref 96–112)
Creatinine, Ser: 1 mg/dL (ref 0.50–1.10)
Glucose, Bld: 100 mg/dL — ABNORMAL HIGH (ref 70–99)
HCT: 51 % — ABNORMAL HIGH (ref 36.0–46.0)
Hemoglobin: 17.3 g/dL — ABNORMAL HIGH (ref 12.0–15.0)
Potassium: 4.1 mEq/L (ref 3.5–5.1)
Sodium: 139 mEq/L (ref 135–145)
TCO2: 28 mmol/L (ref 0–100)

## 2011-05-05 MED ORDER — METOPROLOL TARTRATE 50 MG PO TABS: 50.0000 mg | ORAL_TABLET | Freq: Two times a day (BID) | ORAL | Status: DC

## 2011-05-05 MED ORDER — FLUTICASONE PROPIONATE 50 MCG/ACT NA SUSP: 2.0000 | Freq: Every day | NASAL | Status: DC

## 2011-05-05 MED ORDER — HYDROCOD POLST-CHLORPHEN POLST 10-8 MG/5ML PO LQCR: 5.0000 mL | Freq: Two times a day (BID) | ORAL | Status: DC | PRN

## 2011-05-05 NOTE — Discharge Instructions (Signed)
Increase Lopressor 50 mg to 1 twice daily.  Try nasal spray and cough syrup.  See Dr. Rollene Fare in 2 days.

## 2011-05-05 NOTE — ED Provider Notes (Signed)
Chief Complaint  Patient presents with  . URI    History of Present Illness:   [name] is a 66 year old female with asthma, hypertension, heart disease, and has a defibrillator and pacemaker. She is followed by Dr. Terance Ice for cardiology and Dr. Hardie Pulley for her asthma. She also has a primary care doctor as well, but was not able to get in to see him today. She presents with a three-week history of cough and congestion. She notes nasal congestion with white drainage, burning of the nose and eyes, cough productive of white sputum, wheezing, and it hurts in her chest and back when she coughs. She also had a headache, sinus pain, sore throat, postnasal drip, earache, sweats, and fatigue. She has a history of irritable bowel syndrome but has not been troubled by this much. She was seen at [facility] urgent care when these symptoms first came on and was prescribed a cephalosporin, then azithromycin and prednisone. It did not get any better, so a week ago she went to see Dr. Remus Blake who prescribed prednisone and clarithromycin. She finished her prednisone 4 days ago and is still taking the clarithromycin. She denies any PND, orthopnea, or dyspnea on exertion. She has no history of congestive heart failure. She does have a history of allergies. She denies any anginal type chest pain. She does get some chest pain when she coughs. She has not had any fever or chills. She denies any pleuritic chest pain. She states she is under a lot of stress, taking care of a sick husband and also working part-time. She is not a cigarette smoker.  Review of Systems:  Other than noted above, the patient denies any of the following symptoms. Systemic:  No fever, chills, sweats, fatigue, myalgias, headache, or anorexia. Eye:  No redness, pain or drainage. ENT:  No earache, nasal congestion, rhinorrhea, sinus pressure, or sore throat. Lungs:  No cough, sputum production, wheezing, shortness of breath. Or chest  pain. GI:  No nausea, vomiting, abdominal pain or diarrhea. Skin:  No rash or itching.  [place]:  Past medical history, family history, social history, meds, and allergies were reviewed.  Physical Exam:   Vital signs:  BP 178/100  Pulse 98  Temp(Src) 98.3 F (36.8 C) (Oral)  Resp 24  SpO2 96% General:  Alert, in no distress. Eye:  No conjunctival injection or drainage. ENT:  TMs and canals were normal, without erythema or inflammation.  Nasal mucosa was clear and uncongested, without drainage.  Mucous membranes were moist.  Pharynx was clear, without exudate or drainage.  There were no oral ulcerations or lesions. Neck:  Supple, no adenopathy, tenderness or mass. Lungs:  No respiratory distress.  Lungs were clear to auscultation, without wheezes, rales or rhonchi.  Breath sounds were clear and equal bilaterally. Heart:  Regular rhythm, without gallops, murmers or rubs. Skin:  Clear, warm, and dry, without rash or lesions.  Labs:   Results for orders placed during the hospital encounter of 05/05/11  POCT I-STAT, CHEM 8      Component Value Range   Sodium 139  135 - 145 (mEq/L)   Potassium 4.1  3.5 - 5.1 (mEq/L)   Chloride 106  96 - 112 (mEq/L)   BUN 26 (*) 6 - 23 (mg/dL)   Creatinine, Ser 1.00  0.50 - 1.10 (mg/dL)   Glucose, Bld 100 (*) 70 - 99 (mg/dL)   Calcium, Ion 1.22  1.12 - 1.32 (mmol/L)   TCO2 28  0 - 100 (  mmol/L)   Hemoglobin 17.3 (*) 12.0 - 15.0 (g/dL)   HCT 51.0 (*) 36.0 - 46.0 (%)     Radiology:  Dg Chest 2 View  05/05/2011  *RADIOLOGY REPORT*  Clinical Data: 66 year old female with cough and chest pain.  On antibiotics.  CHEST - 2 VIEW  Comparison: 10/13/2010 and earlier.  Findings: Stable lung volumes.  Stable right chest cardiac AICD. Stable cardiomegaly and mediastinal contours.  No pneumothorax, pulmonary edema, pleural effusion or acute airspace disease. Stable visualized osseous structures.  Cervical ACDF hardware again noted.  IMPRESSION: No acute cardiopulmonary  abnormality.  Original Report Authenticated By: Randall An, M.D.    Assessment:   Diagnoses that have been ruled out:  None  Diagnoses that are still under consideration:  None  Final diagnoses:  Chronic cough- this could be due to reactive airway disease, postnasal drip, or reflux, or any combination of the above. Her lungs sound clear her chest x-ray looks normal. Also of concern is congestive heart failure. She doesn't have any signs or symptoms of this however. Her blood pressure is markedly elevated, and I have told her to increase her dose of metoprolol and followup with her cardiologist early next week. For the cough Will try Flonase and Tussionex. She is ready on Symbicort. She is taking an acid blocker and Astelin nasal spray. I don't think she needs anymore antibiotics. She is a ready been on several courses of prednisone, I think if this were going to help she would be better by now.       Plan:   1.  The following meds were prescribed:   New Prescriptions   CHLORPHENIRAMINE-HYDROCODONE (TUSSIONEX) 10-8 MG/5ML LQCR    Take 5 mLs by mouth every 12 (twelve) hours as needed.   FLUTICASONE (FLONASE) 50 MCG/ACT NASAL SPRAY    Place 2 sprays into the nose daily.   METOPROLOL (LOPRESSOR) 50 MG TABLET    Take 1 tablet (50 mg total) by mouth 2 (two) times daily.   2.  The patient was instructed in symptomatic care and handouts were given. 3.  The patient was told to return if becoming worse in any way, if no better in 3 or 4 days, and given some red flag symptoms that would indicate earlier return.   Harden Mo, MD 05/05/11 [phone]

## 2011-05-05 NOTE — ED Notes (Signed)
Pt. States she came today to get "checked out for a viral infection".  Had sore throat, left ear pain, and cough X3 weeks.  Was seen at urgent care [place] [address] and prescribed prednisone and antibiotic but denies relief. States asthma doctor then changed her prescription to clarithromycin which she began on 3/19.  Reports relief from ear pain but is still complaining of sore throat and productive cough. Reports large amounts of white/green mucous. States she thinks she had a fever Saturday night because she "woke up sweating".

## 2011-05-05 NOTE — ED Notes (Signed)
Prescription given with discharge instructions.  

## 2011-05-10 ENCOUNTER — Other Ambulatory Visit: Payer: Self-pay | Admitting: Physician Assistant

## 2011-07-09 ENCOUNTER — Other Ambulatory Visit: Payer: Self-pay | Admitting: Family Medicine

## 2011-07-09 ENCOUNTER — Ambulatory Visit
Admission: RE | Admit: 2011-07-09 | Discharge: 2011-07-09 | Disposition: A | Discharge status: Home / Self Care | Payer: Medicare Other | Source: Ambulatory Visit | Attending: Family Medicine | Admitting: Family Medicine

## 2011-07-09 DIAGNOSIS — R52 Pain, unspecified: Secondary | ICD-10-CM

## 2011-09-29 ENCOUNTER — Telehealth: Payer: Self-pay | Admitting: Internal Medicine

## 2011-09-29 NOTE — Telephone Encounter (Signed)
Recall Project: Pt will call back in a few weeks once Dr. Nichola Sizer November calendar is available.

## 2011-10-03 ENCOUNTER — Other Ambulatory Visit: Payer: Self-pay | Admitting: Family Medicine

## 2011-10-03 DIAGNOSIS — R1310 Dysphagia, unspecified: Secondary | ICD-10-CM

## 2011-10-09 IMAGING — CR DG CHEST 2V
2 series · 2 of 2 positions shown · non-contrast
Comparison: Chest x-ray 05/15/2009.

CLINICAL DATA: Chest pain.

CHEST - 2 VIEW

[w chest pa]
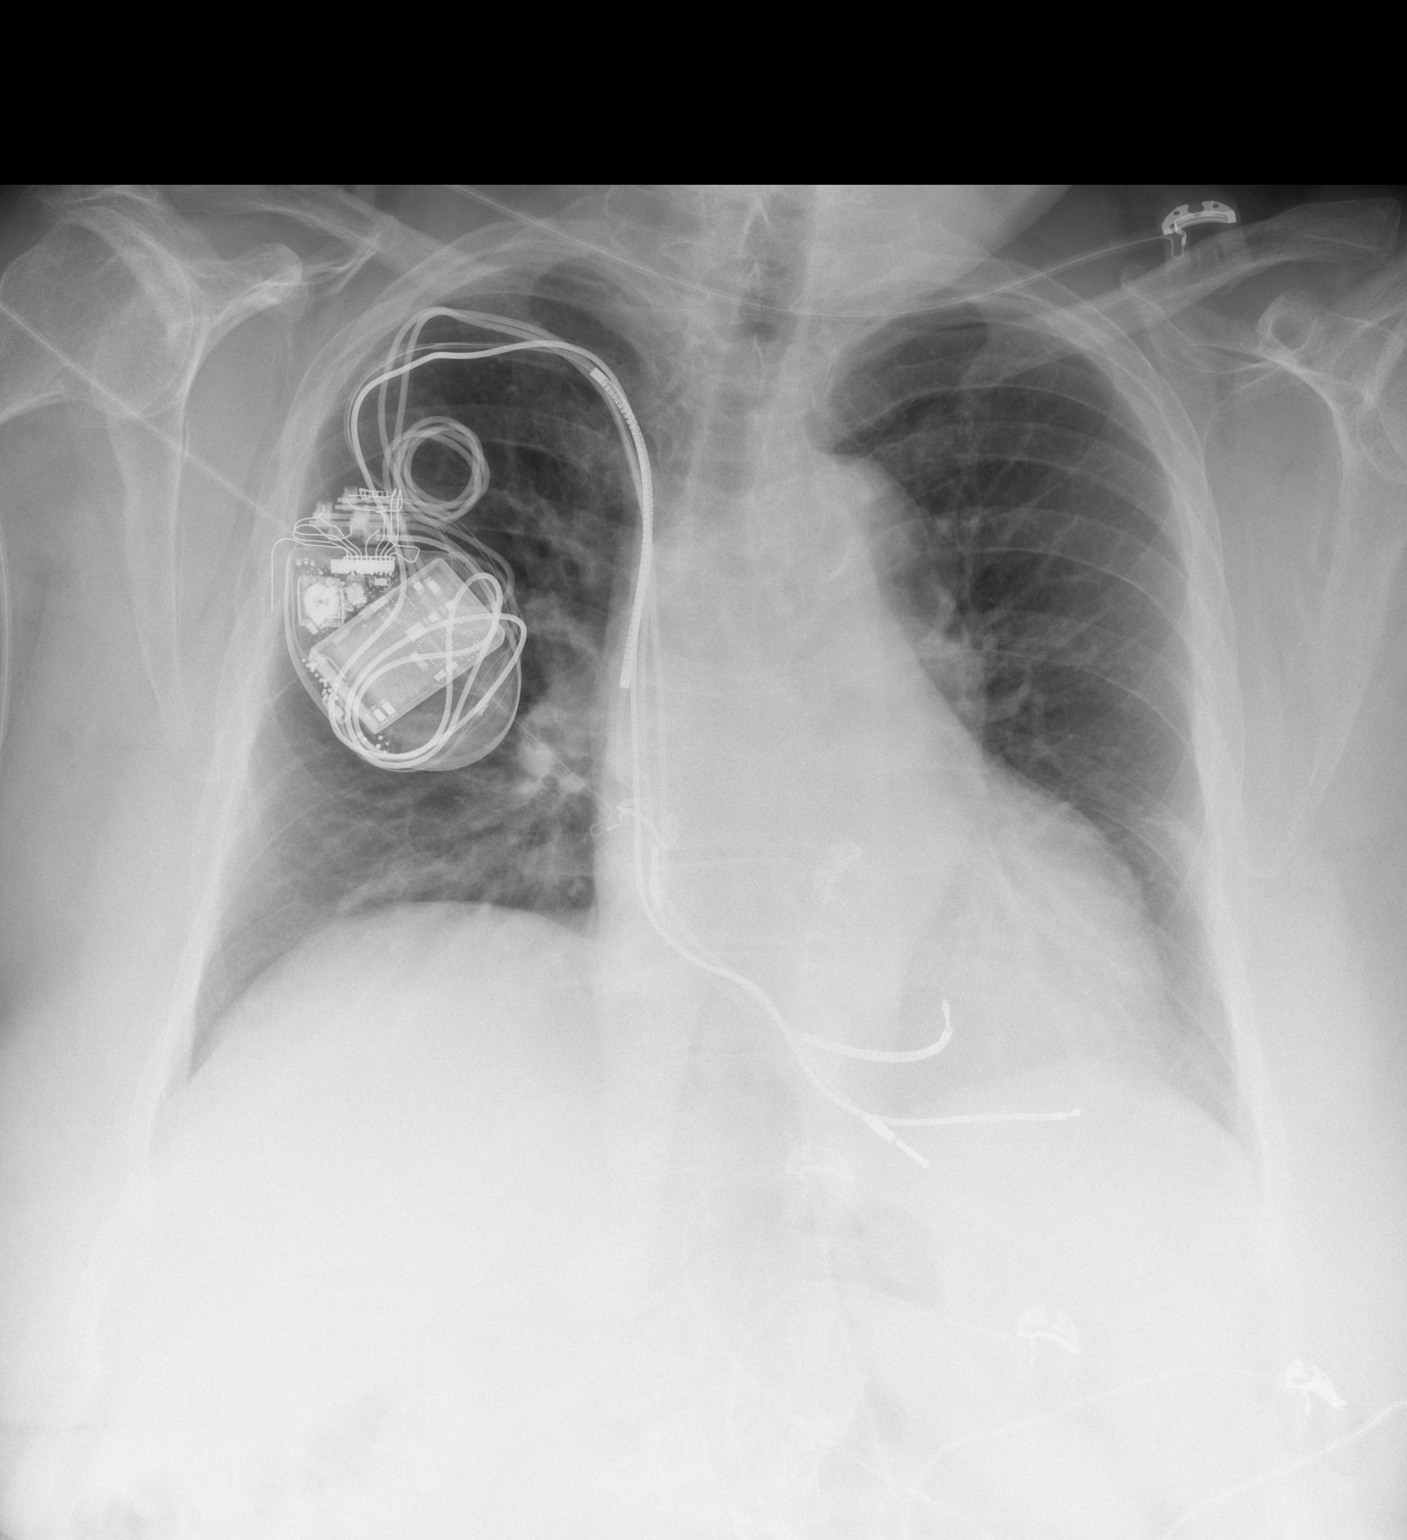

[w chest lat]
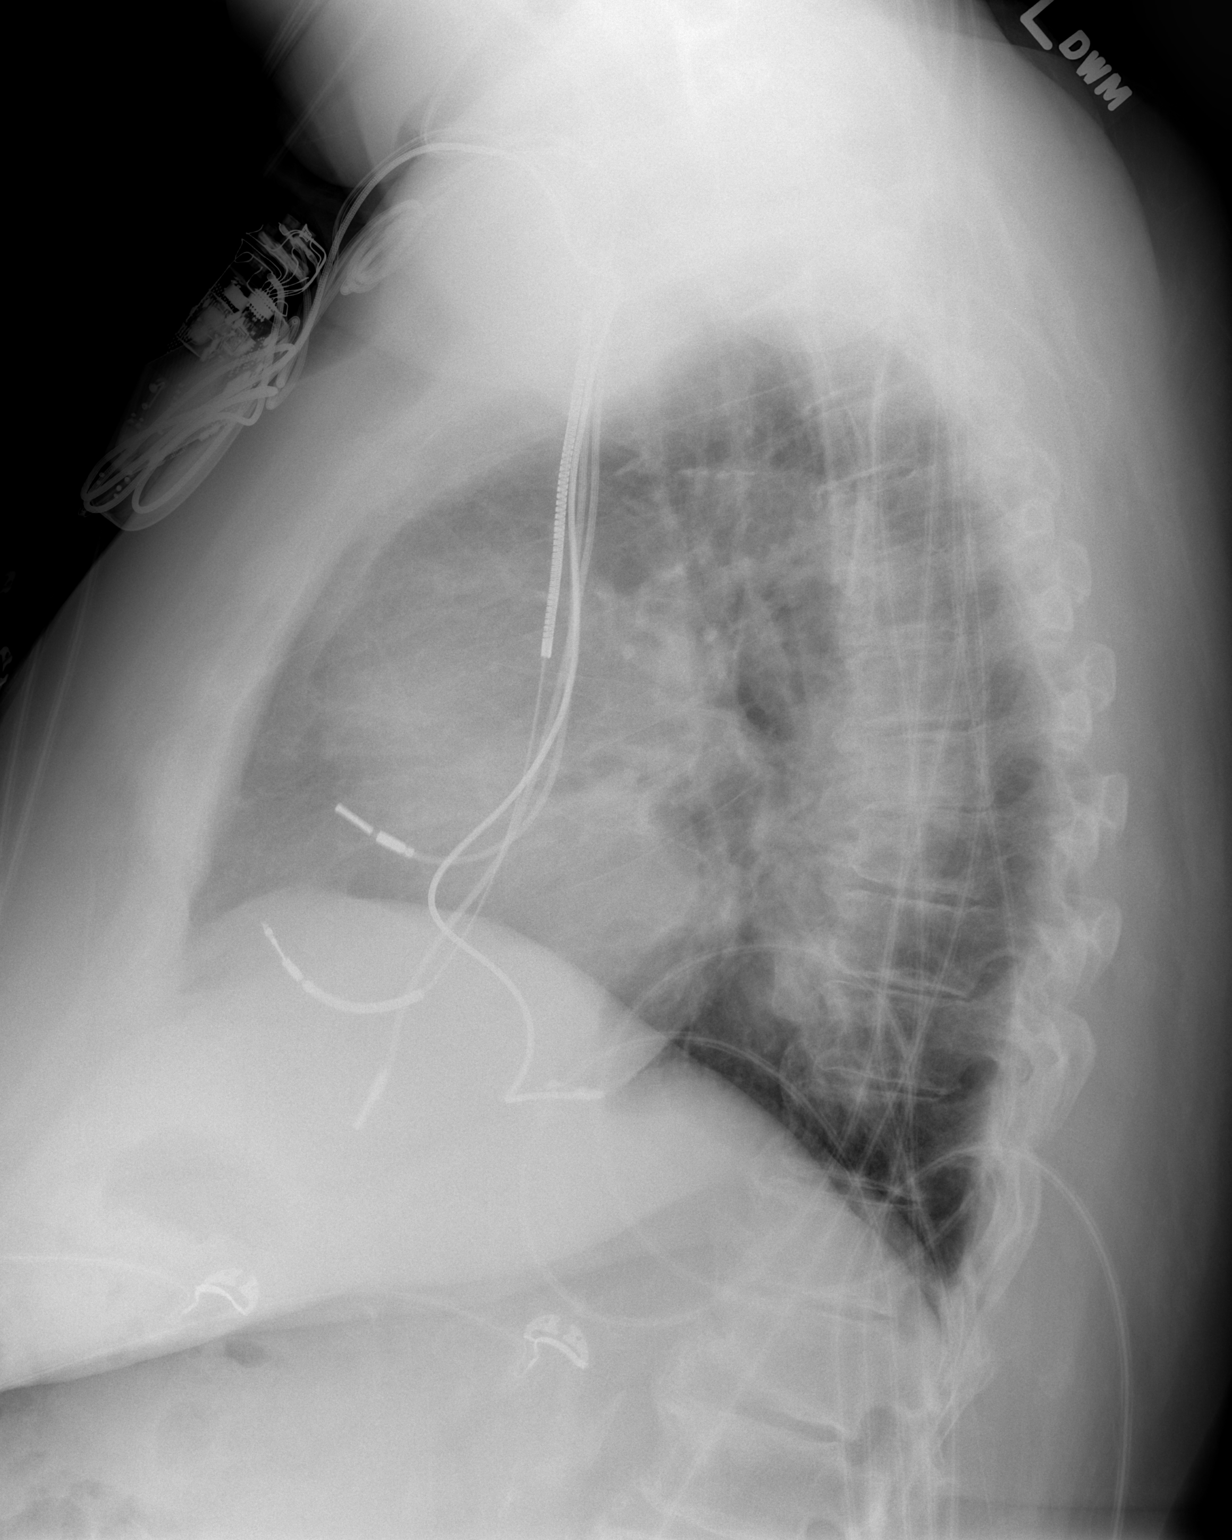

[2 of 2 positions shown; findings below may reference images not displayed]

FINDINGS: The pacer wires / AICD are stable.  The heart is
borderline enlarged but unchanged.  There is tortuosity, ectasia
and calcification of the thoracic aorta.  The lungs are clear.  No
pleural effusion.  The bony thorax is intact.  Stable degenerative
changes involving the thoracic spine.
IMPRESSION: No acute cardiopulmonary findings.

## 2011-10-10 ENCOUNTER — Ambulatory Visit
Admission: RE | Admit: 2011-10-10 | Discharge: 2011-10-10 | Disposition: A | Discharge status: Home / Self Care | Payer: Medicare Other | Source: Ambulatory Visit | Attending: Family Medicine | Admitting: Family Medicine

## 2011-10-10 DIAGNOSIS — R1310 Dysphagia, unspecified: Secondary | ICD-10-CM

## 2011-11-11 ENCOUNTER — Telehealth: Payer: Self-pay | Admitting: Internal Medicine

## 2011-11-11 NOTE — Telephone Encounter (Signed)
Patient states she has had  Upper abdominal pain that radiates from shoulders and "stomach swelling"  for a couple of weeks. She saw her cardiologist and had an upper GI which was normal. Her cardiologist told her it is not heart related and told her to see her GI. He also ordered a $95 medication to take which she states she cannot afford.  She reports the pain is relieved by Tylenol but returns when she eats or drinks. Also, she reports her husband has cancer and she is very stressed. Scheduled patient with Dr. Olevia Perches on 11/25/11 at 2:00 PM.

## 2011-11-11 NOTE — Telephone Encounter (Signed)
Increase Prilosec to 40 mg po bid,, Gaviscon 1 tablet chew  pc and hs.

## 2011-11-12 NOTE — Telephone Encounter (Signed)
Spoke with patient and gave her Dr. Brodie's recommendations. 

## 2011-11-12 NOTE — Telephone Encounter (Signed)
Left a message for patient to call me. 

## 2011-11-14 HISTORY — PX: OTHER SURGICAL HISTORY: SHX169

## 2011-11-17 ENCOUNTER — Encounter: Payer: Self-pay | Admitting: *Deleted

## 2011-11-22 ENCOUNTER — Emergency Department (HOSPITAL_COMMUNITY): Payer: Medicare Other

## 2011-11-22 ENCOUNTER — Encounter (HOSPITAL_COMMUNITY): Payer: Self-pay | Admitting: *Deleted

## 2011-11-22 ENCOUNTER — Observation Stay (HOSPITAL_COMMUNITY)
Admission: EM | Admit: 2011-11-22 | Discharge: 2011-11-23 | DRG: 948 | Disposition: A | Discharge status: Home / Self Care | Payer: Medicare Other | Attending: Cardiovascular Disease | Admitting: Cardiovascular Disease

## 2011-11-22 DIAGNOSIS — R0602 Shortness of breath: Secondary | ICD-10-CM

## 2011-11-22 DIAGNOSIS — R5381 Other malaise: Principal | ICD-10-CM | POA: Insufficient documentation

## 2011-11-22 DIAGNOSIS — J4489 Other specified chronic obstructive pulmonary disease: Secondary | ICD-10-CM | POA: Insufficient documentation

## 2011-11-22 DIAGNOSIS — R079 Chest pain, unspecified: Secondary | ICD-10-CM

## 2011-11-22 DIAGNOSIS — I509 Heart failure, unspecified: Secondary | ICD-10-CM | POA: Insufficient documentation

## 2011-11-22 DIAGNOSIS — Z95810 Presence of automatic (implantable) cardiac defibrillator: Secondary | ICD-10-CM | POA: Insufficient documentation

## 2011-11-22 DIAGNOSIS — K219 Gastro-esophageal reflux disease without esophagitis: Secondary | ICD-10-CM | POA: Insufficient documentation

## 2011-11-22 DIAGNOSIS — G4730 Sleep apnea, unspecified: Secondary | ICD-10-CM | POA: Insufficient documentation

## 2011-11-22 DIAGNOSIS — J449 Chronic obstructive pulmonary disease, unspecified: Secondary | ICD-10-CM | POA: Insufficient documentation

## 2011-11-22 DIAGNOSIS — I10 Essential (primary) hypertension: Secondary | ICD-10-CM | POA: Insufficient documentation

## 2011-11-22 DIAGNOSIS — I429 Cardiomyopathy, unspecified: Secondary | ICD-10-CM | POA: Diagnosis present

## 2011-11-22 DIAGNOSIS — I428 Other cardiomyopathies: Secondary | ICD-10-CM | POA: Insufficient documentation

## 2011-11-22 DIAGNOSIS — G4733 Obstructive sleep apnea (adult) (pediatric): Secondary | ICD-10-CM | POA: Diagnosis present

## 2011-11-22 DIAGNOSIS — Z950 Presence of cardiac pacemaker: Secondary | ICD-10-CM | POA: Diagnosis present

## 2011-11-22 DIAGNOSIS — M549 Dorsalgia, unspecified: Secondary | ICD-10-CM | POA: Insufficient documentation

## 2011-11-22 DIAGNOSIS — I2510 Atherosclerotic heart disease of native coronary artery without angina pectoris: Secondary | ICD-10-CM | POA: Insufficient documentation

## 2011-11-22 HISTORY — DX: Shortness of breath: R06.02

## 2011-11-22 HISTORY — DX: Other cardiomyopathies: I42.8

## 2011-11-22 LAB — BASIC METABOLIC PANEL
BUN: 25 mg/dL — ABNORMAL HIGH (ref 6–23)
CO2: 26 mEq/L (ref 19–32)
Calcium: 10.2 mg/dL (ref 8.4–10.5)
Chloride: 103 mEq/L (ref 96–112)
Creatinine, Ser: 1.12 mg/dL — ABNORMAL HIGH (ref 0.50–1.10)
GFR calc Af Amer: 58 mL/min — ABNORMAL LOW (ref >90)
GFR calc non Af Amer: 50 mL/min — ABNORMAL LOW (ref >90)
Glucose, Bld: 170 mg/dL — ABNORMAL HIGH (ref 70–99)
Potassium: 4.6 mEq/L (ref 3.5–5.1)
Sodium: 140 mEq/L (ref 135–145)

## 2011-11-22 LAB — URINALYSIS, ROUTINE W REFLEX MICROSCOPIC
Bilirubin Urine: NEGATIVE
Glucose, UA: NEGATIVE mg/dL
Hgb urine dipstick: NEGATIVE
Ketones, ur: NEGATIVE mg/dL
Leukocytes, UA: NEGATIVE
Nitrite: NEGATIVE
Protein, ur: NEGATIVE mg/dL
Specific Gravity, Urine: 1.025 (ref 1.005–1.030)
Urobilinogen, UA: 0.2 mg/dL (ref 0.0–1.0)
pH: 5.5 (ref 5.0–8.0)

## 2011-11-22 LAB — CBC
HCT: 46.4 % — ABNORMAL HIGH (ref 36.0–46.0)
Hemoglobin: 15 g/dL (ref 12.0–15.0)
MCH: 27.9 pg (ref 26.0–34.0)
MCHC: 32.3 g/dL (ref 30.0–36.0)
MCV: 86.2 fL (ref 78.0–100.0)
Platelets: 180 10*3/uL (ref 150–400)
RBC: 5.38 MIL/uL — ABNORMAL HIGH (ref 3.87–5.11)
RDW: 14.8 % (ref 11.5–15.5)
WBC: 7.3 10*3/uL (ref 4.0–10.5)

## 2011-11-22 LAB — POCT I-STAT TROPONIN I: Troponin i, poc: 0 ng/mL (ref 0.00–0.08)

## 2011-11-22 LAB — TROPONIN I: Troponin I: <0.3 ng/mL (ref <0.30)

## 2011-11-22 LAB — PRO B NATRIURETIC PEPTIDE: Pro B Natriuretic peptide (BNP): 189 pg/mL — ABNORMAL HIGH (ref 0–125)

## 2011-11-22 MED ORDER — INFLUENZA VIRUS VACC SPLIT PF IM SUSP
0.5000 mL | INTRAMUSCULAR | Status: DC
  Filled 2011-11-22: qty 0.5

## 2011-11-22 MED ORDER — METHOCARBAMOL 500 MG PO TABS
500.0000 mg | ORAL_TABLET | Freq: Two times a day (BID) | ORAL | Status: DC | PRN
  Filled 2011-11-22: qty 1

## 2011-11-22 MED ORDER — ONDANSETRON HCL 4 MG/2ML IJ SOLN: 4.0000 mg | Freq: Four times a day (QID) | INTRAMUSCULAR | Status: DC | PRN

## 2011-11-22 MED ORDER — PREDNISONE 50 MG PO TABS
50.0000 mg | ORAL_TABLET | Freq: Once | ORAL | Status: AC
  Administered 2011-11-23: 50 mg via ORAL
  Filled 2011-11-22: qty 1

## 2011-11-22 MED ORDER — ATORVASTATIN CALCIUM 20 MG PO TABS
20.0000 mg | ORAL_TABLET | Freq: Every day | ORAL | Status: DC
  Administered 2011-11-22: 20 mg via ORAL
  Filled 2011-11-22 (×2): qty 1

## 2011-11-22 MED ORDER — ACETAMINOPHEN 325 MG PO TABS
650.0000 mg | ORAL_TABLET | ORAL | Status: DC | PRN
  Filled 2011-11-22: qty 2

## 2011-11-22 MED ORDER — PANTOPRAZOLE SODIUM 40 MG PO TBEC
40.0000 mg | DELAYED_RELEASE_TABLET | Freq: Every day | ORAL | Status: DC
  Administered 2011-11-23: 40 mg via ORAL
  Filled 2011-11-22: qty 1

## 2011-11-22 MED ORDER — ALBUTEROL SULFATE HFA 108 (90 BASE) MCG/ACT IN AERS
2.0000 | INHALATION_SPRAY | Freq: Four times a day (QID) | RESPIRATORY_TRACT | Status: DC | PRN
  Administered 2011-11-22 – 2011-11-23 (×2): 2 via RESPIRATORY_TRACT
  Filled 2011-11-22: qty 6.7

## 2011-11-22 MED ORDER — ALPRAZOLAM 0.25 MG PO TABS: 0.2500 mg | ORAL_TABLET | Freq: Two times a day (BID) | ORAL | Status: DC | PRN

## 2011-11-22 MED ORDER — ASPIRIN EC 81 MG PO TBEC
81.0000 mg | DELAYED_RELEASE_TABLET | Freq: Every day | ORAL | Status: DC
  Administered 2011-11-23: 81 mg via ORAL
  Filled 2011-11-22: qty 1

## 2011-11-22 MED ORDER — METOPROLOL SUCCINATE ER 25 MG PO TB24
25.0000 mg | ORAL_TABLET | Freq: Two times a day (BID) | ORAL | Status: DC
  Administered 2011-11-22 – 2011-11-23 (×2): 25 mg via ORAL
  Filled 2011-11-22 (×3): qty 1

## 2011-11-22 MED ORDER — LORATADINE 10 MG PO TABS
10.0000 mg | ORAL_TABLET | Freq: Every day | ORAL | Status: DC
  Administered 2011-11-23: 10 mg via ORAL
  Filled 2011-11-22: qty 1

## 2011-11-22 MED ORDER — DIPHENHYDRAMINE HCL 50 MG PO CAPS
50.0000 mg | ORAL_CAPSULE | Freq: Once | ORAL | Status: AC
  Administered 2011-11-22: 50 mg via ORAL
  Filled 2011-11-22: qty 1

## 2011-11-22 MED ORDER — ENOXAPARIN SODIUM 40 MG/0.4ML ~~LOC~~ SOLN
40.0000 mg | SUBCUTANEOUS | Status: DC
  Administered 2011-11-22: 40 mg via SUBCUTANEOUS
  Filled 2011-11-22 (×2): qty 0.4

## 2011-11-22 MED ORDER — DOCUSATE SODIUM 100 MG PO CAPS
100.0000 mg | ORAL_CAPSULE | Freq: Every day | ORAL | Status: DC
  Administered 2011-11-22: 100 mg via ORAL
  Filled 2011-11-22 (×2): qty 2

## 2011-11-22 MED ORDER — BUDESONIDE-FORMOTEROL FUMARATE 160-4.5 MCG/ACT IN AERO
2.0000 | INHALATION_SPRAY | Freq: Two times a day (BID) | RESPIRATORY_TRACT | Status: DC
  Administered 2011-11-22 – 2011-11-23 (×2): 2 via RESPIRATORY_TRACT
  Filled 2011-11-22: qty 6

## 2011-11-22 MED ORDER — FUROSEMIDE 40 MG PO TABS
40.0000 mg | ORAL_TABLET | Freq: Two times a day (BID) | ORAL | Status: DC
  Administered 2011-11-23: 40 mg via ORAL
  Filled 2011-11-22 (×4): qty 1

## 2011-11-22 MED ORDER — BUSPIRONE HCL 5 MG PO TABS
5.0000 mg | ORAL_TABLET | Freq: Two times a day (BID) | ORAL | Status: DC
  Administered 2011-11-22 – 2011-11-23 (×2): 5 mg via ORAL
  Filled 2011-11-22 (×3): qty 1

## 2011-11-22 MED ORDER — SPIRONOLACTONE 12.5 MG HALF TABLET
12.5000 mg | ORAL_TABLET | Freq: Two times a day (BID) | ORAL | Status: DC
  Administered 2011-11-22 – 2011-11-23 (×2): 12.5 mg via ORAL
  Filled 2011-11-22 (×3): qty 1

## 2011-11-22 MED ORDER — MORPHINE SULFATE 4 MG/ML IJ SOLN
4.0000 mg | Freq: Once | INTRAMUSCULAR | Status: AC
  Administered 2011-11-22: 4 mg via INTRAVENOUS
  Filled 2011-11-22: qty 1

## 2011-11-22 MED ORDER — FLUTICASONE PROPIONATE 50 MCG/ACT NA SUSP
2.0000 | Freq: Every day | NASAL | Status: DC
  Administered 2011-11-23: 2 via NASAL
  Filled 2011-11-22: qty 16

## 2011-11-22 MED ORDER — SODIUM CHLORIDE 0.9 % IJ SOLN: 3.0000 mL | INTRAMUSCULAR | Status: DC | PRN

## 2011-11-22 MED ORDER — ONDANSETRON HCL 4 MG/2ML IJ SOLN
4.0000 mg | Freq: Once | INTRAMUSCULAR | Status: AC
  Administered 2011-11-22: 4 mg via INTRAVENOUS
  Filled 2011-11-22: qty 2

## 2011-11-22 MED ORDER — NITROGLYCERIN 0.4 MG SL SUBL: 0.4000 mg | SUBLINGUAL_TABLET | SUBLINGUAL | Status: DC | PRN

## 2011-11-22 MED ORDER — TRAMADOL HCL 50 MG PO TABS
50.0000 mg | ORAL_TABLET | Freq: Four times a day (QID) | ORAL | Status: DC | PRN
  Administered 2011-11-22 – 2011-11-23 (×2): 50 mg via ORAL
  Filled 2011-11-22 (×2): qty 1

## 2011-11-22 MED ORDER — LOSARTAN POTASSIUM 50 MG PO TABS
100.0000 mg | ORAL_TABLET | Freq: Two times a day (BID) | ORAL | Status: DC
  Administered 2011-11-22 – 2011-11-23 (×2): 100 mg via ORAL
  Filled 2011-11-22 (×3): qty 2

## 2011-11-22 MED ORDER — POTASSIUM CHLORIDE CRYS ER 20 MEQ PO TBCR
20.0000 meq | EXTENDED_RELEASE_TABLET | Freq: Two times a day (BID) | ORAL | Status: DC
  Administered 2011-11-22 – 2011-11-23 (×2): 20 meq via ORAL
  Filled 2011-11-22 (×3): qty 1

## 2011-11-22 MED ORDER — AMLODIPINE BESYLATE 10 MG PO TABS
10.0000 mg | ORAL_TABLET | Freq: Every day | ORAL | Status: DC
  Administered 2011-11-23: 10 mg via ORAL
  Filled 2011-11-22: qty 1

## 2011-11-22 MED ORDER — ZOLPIDEM TARTRATE 5 MG PO TABS: 5.0000 mg | ORAL_TABLET | Freq: Every evening | ORAL | Status: DC | PRN

## 2011-11-22 MED ORDER — SODIUM CHLORIDE 0.9 % IV SOLN
250.0000 mL | INTRAVENOUS | Status: DC | PRN
  Administered 2011-11-22: 250 mL via INTRAVENOUS

## 2011-11-22 NOTE — ED Notes (Signed)
Patient states she has not felt well for a few days,  Weak and tired.  She is short of breath.  Patient states she is under increased stress due to her husband having terminal cancer.  Patient states she feels like she is going to pass out.  Patient states she has had swelling in her abdomen.  She has pain that goes from her abdomen into her back.  Patient denies n/vd.  Last bm was today

## 2011-11-22 NOTE — ED Notes (Signed)
Pt presents to department for evaluation of midsternal non radiating chest pain. Ongoing for several weeks. States pain became worse this morning while at work. Also states SOB on exertion. Denies pain at the time. States she was seen by Cardiologist and had negative stress test. Respirations unlabored. Pt speaking complete sentences. Lung sounds clear and equal bilaterally. She is conscious alert and oriented x4.

## 2011-11-22 NOTE — H&P (Signed)
Patient ID: MICHIYE LISBOA MRN: [id number], DOB/AGE: [birth year]   Admit date: 11/22/2011   Primary Physician: Lilian Coma, MD Primary Cardiologist: Dr Rollene Fare  HPI: 66 y/o morbidly obese female with a history of non obstructive CAD by cath 2010 (40% LAD). She has a history of PTVDP in 2001 with an upgrade to a BiV in 2008 for NICM (EF 15-20% then). She is a responder, EF >55% in 2012. She just saw Dr Rollene Fare in the office 11/06/11. She has been having mid scapular pain and DOE. Myoview was done in our office last week, results unknown. Dr Rollene Fare had also mentioned getting an echo but this ahs not been done yet. She came to the ER this am with complaints "weakness". She says she "just couldn't get going". She denied chest pain to me but did admit to mid scapular pain that she has been having for some time now. She is under a lot stress at home, her husband has recurrent head and neck cancer. Troponin and BNP WNL.   Problem List: Past Medical History  Diagnosis Date  . Asthma   . Hypertension   . Irregular heart rate   . Arthritis   . CHF (congestive heart failure)   . GERD (gastroesophageal reflux disease)   . Diverticulosis   . Anemia   . CAD (coronary artery disease)   . Fatty liver disease, nonalcoholic   . Internal hemorrhoids   . Gastritis   . Hyperplastic colon polyp   . Sleep apnea     Past Surgical History  Procedure Date  . Pacemaker insertion   . Cardiac defibrillator placement   . Appendectomy   . Cardiac defibrillator placement   . Knee surgery   . Ankle surgery   . Tubal ligation      Allergies:  Allergies  Allergen Reactions  . Ivp Dye (Iodinated Diagnostic Agents) Shortness Of Breath  . Shellfish-Derived Products Anaphylaxis  . Sulfa Antibiotics Shortness Of Breath  . Iodine Hives     Home Medications  (Not in a hospital admission)   Family History  Problem Relation Age of Onset  . Breast cancer    . Diabetes    . Heart disease    .  Kidney disease       History   Social History  . Marital Status: Married    Spouse Name: N/A    Number of Children: N/A  . Years of Education: N/A   Occupational History  . Not on file.   Social History Main Topics  . Smoking status: Former Research scientist (life sciences)  . Smokeless tobacco: Never Used  . Alcohol Use: No  . Drug Use: No  . Sexually Active: Not on file   Other Topics Concern  . Not on file   Social History Narrative  . No narrative on file     Review of Systems: General: negative for chills, fever, night sweats or weight changes.  Cardiovascular: negative for chest pain, dyspnea on exertion, edema, orthopnea, palpitations, paroxysmal nocturnal dyspnea or shortness of breath Dermatological: negative for rash Respiratory: negative for cough or wheezing Urologic: negative for hematuria Abdominal: negative for nausea, vomiting, diarrhea, bright red blood per rectum, melena, or hematemesis Neurologic: negative for visual changes, syncope, or dizziness All other systems reviewed and are otherwise negative except as noted above.  Physical Exam: Blood pressure 135/85, pulse 69, temperature 98.6 F (37 C), temperature source Oral, resp. rate 18, height 5\' 5"  (1.651 m), weight 106.595 kg (235 lb), SpO2 96.00%.  General appearance: alert, cooperative, no distress and morbidly obese Neck: no carotid bruit and no JVD Lungs: clear to auscultation bilaterally Heart: regular rate and rhythm Abdomen: morbid obesity Extremities: no edema Pulses: 2+ and symmetric Skin: cool and dry Neurologic: Grossly normal    Labs:   Results for orders placed during the hospital encounter of 11/22/11 (from the past 24 hour(s))  CBC     Status: Abnormal   Collection Time   11/22/11  8:20 AM      Component Value Range   WBC 7.3  4.0 - 10.5 K/uL   RBC 5.38 (*) 3.87 - 5.11 MIL/uL   Hemoglobin 15.0  12.0 - 15.0 g/dL   HCT 46.4 (*) 36.0 - 46.0 %   MCV 86.2  78.0 - 100.0 fL   MCH 27.9  26.0 - 34.0  pg   MCHC 32.3  30.0 - 36.0 g/dL   RDW 14.8  11.5 - 15.5 %   Platelets 180  150 - 400 K/uL  BASIC METABOLIC PANEL     Status: Abnormal   Collection Time   11/22/11  8:20 AM      Component Value Range   Sodium 140  135 - 145 mEq/L   Potassium 4.6  3.5 - 5.1 mEq/L   Chloride 103  96 - 112 mEq/L   CO2 26  19 - 32 mEq/L   Glucose, Bld 170 (*) 70 - 99 mg/dL   BUN 25 (*) 6 - 23 mg/dL   Creatinine, Ser 1.12 (*) 0.50 - 1.10 mg/dL   Calcium 10.2  8.4 - 10.5 mg/dL   GFR calc non Af Amer 50 (*) >90 mL/min   GFR calc Af Amer 58 (*) >90 mL/min  PRO B NATRIURETIC PEPTIDE     Status: Abnormal   Collection Time   11/22/11  8:21 AM      Component Value Range   Pro B Natriuretic peptide (BNP) 189.0 (*) 0 - 125 pg/mL  POCT I-STAT TROPONIN I     Status: Normal   Collection Time   11/22/11  8:45 AM      Component Value Range   Troponin i, poc 0.00  0.00 - 0.08 ng/mL   Comment 3              Radiology/Studies: Dg Chest Port 1 View  11/22/2011  *RADIOLOGY REPORT*  Clinical Data: Chest pain.  Weakness.  Short of breath.  PORTABLE CHEST - 1 VIEW  Comparison: 07/09/2011.05/05/2011.  Findings: Cardiopericardial silhouette within normal limits for projection. Right subclavian AICD appears similar to prior.  No airspace disease.  No effusion.  Linear scarring or subsegmental atelectasis present in the left midlung.  Aortic arch atherosclerosis.  IMPRESSION: No acute cardiopulmonary disease.   Original Report Authenticated By: Dereck Ligas, M.D.     [id number]  ASSESSMENT AND PLAN:  Principal Problem:  *Weakness Active Problems:  CAD, non obstructive by cath 2010  PTVDP 2001, upgraded to BS BiV device with good response 2008  Cardiomyopathy, nonischemic, Bi V responder, Last EF >55%  Back pain  HYPERTENSION  Morbid obesity  COPD (chronic obstructive pulmonary disease), asthmatic component  GERD (gastroesophageal reflux disease), BA swallow last week "OK"  Sleep apnea, C-pap intol  Plan- Will  discuss with MD. Admit, ? Chest CT to r/o chronic dissection. Check echo and review Myoview results.   Henri Medal, PA-C 11/22/2011, 11:44 AM  I have seen and examined the patient along with Kerin Ransom, PA-C.  I have reviewed  the chart, notes and new data.  I agree with PA's note.  Key new complaints: persistent, sometimes severe chest/interscapular pain raises concern for aortic etiology (although more likely musculoskeletal); she has numerous symptoms of untreated sleep apnea - the diagnosis was confirmed years ago, but she never really tried CPAP since she "cannot stand anything on her face". Key examination changes: morbidly obese  PLAN: CT angio of chest. Will get Myoview results. Echo as had been planned, will try to get in house.  Sanda Klein, MD, [place] [phone] 11/22/2011, 2:40 PM

## 2011-11-22 NOTE — ED Provider Notes (Signed)
Medical screening examination/treatment/procedure(s) were performed by non-physician practitioner and as supervising physician I was immediately available for consultation/collaboration.  Threasa Beards, MD 11/22/11 1257

## 2011-11-22 NOTE — Progress Notes (Signed)
Discussed contrast allergy with radiologist. Pre medication per radiology protocol ordered.  Kerin Ransom PA-C 11/22/2011 3:11 PM

## 2011-11-22 NOTE — ED Provider Notes (Signed)
History     CSN: [id number]  Arrival date & time 11/22/11  0806   First MD Initiated Contact with Patient 11/22/11 0815      Chief Complaint  Patient presents with  . Chest Pain    (Consider location/radiation/quality/duration/timing/severity/associated sxs/prior treatment) HPI  [name] is a 66 y.o. female c/o upper back pain radiating to anterior chest pain, described as "achey and sharp", rated at 10/10 at worst, 2/10 now. Relieved by APAP. Pain started x2weeks ago, last 30 minutes. Pain has been exertional, but non-pleuritic or positional. Pain is associated with  SOB and generalized weakness. Denies N/V, diaphoresis, cough, fever, cough, syncope, prior episodes. Denies recent travel, leg swelling, hemoptysis.  Pt has not received any ASA or NTG in the last 24 hours. Patient's husband has terminal cancer and she is under severe amount of stress secondary to this. She saw her cardiologist Dr. Rollene Fare last week and does not feel her issues are cardiac in nature.  RF: HTN, high cholesterol, CAD, CHF Cath: 2012, ? Stents Last Stress test:  Last week: results unknown Pacemaker Defibrillator interrogated last week -> no issues Cardiologost: Wanita Chamberlain  PCP: Darcus Austin  Past Medical History  Diagnosis Date  . Asthma   . Hypertension   . Irregular heart rate   . Arthritis   . CHF (congestive heart failure)   . GERD (gastroesophageal reflux disease)   . Diverticulosis   . Anemia   . CAD (coronary artery disease)   . Fatty liver disease, nonalcoholic   . Internal hemorrhoids   . Gastritis   . Hyperplastic colon polyp   . Sleep apnea     Past Surgical History  Procedure Date  . Pacemaker insertion   . Cardiac defibrillator placement   . Appendectomy   . Cardiac defibrillator placement   . Knee surgery   . Ankle surgery   . Tubal ligation     Family History  Problem Relation Age of Onset  . Breast cancer    . Diabetes    . Heart disease     . Kidney disease      History  Substance Use Topics  . Smoking status: Former Research scientist (life sciences)  . Smokeless tobacco: Never Used  . Alcohol Use: No    OB History    Grav Para Term Preterm Abortions TAB SAB Ect Mult Living                  Review of Systems  Allergies  Ivp dye; Shellfish-derived products; Sulfa antibiotics; and Iodine  Home Medications   Current Outpatient Rx  Name Route Sig Dispense Refill  . ALBUTEROL SULFATE HFA 108 (90 BASE) MCG/ACT IN AERS Inhalation Inhale 2 puffs into the lungs every 6 (six) hours as needed. For shortness of breath    . ASPIRIN EC 81 MG PO TBEC Oral Take 81 mg by mouth daily.    . BUDESONIDE-FORMOTEROL FUMARATE 80-4.5 MCG/ACT IN AERO Inhalation Inhale 2 puffs into the lungs 2 (two) times daily.      . BUSPIRONE HCL 5 MG PO TABS Oral Take 5 mg by mouth 2 (two) times daily.      [name] METOPROLOL SUCCINATE ER 50 MG PO TB24 Oral Take 25 mg by mouth 2 (two) times daily.     [name] OMEPRAZOLE 20 MG PO CPDR Oral Take 20 mg by mouth 3 (three) times daily.    [name] SPIRONOLACTONE 25 MG PO TABS Oral Take 12.5 mg by mouth 2 (two)  times daily.      . AZELASTINE HCL 137 MCG/SPRAY NA SOLN Nasal Place 1 spray into the nose 2 (two) times daily. Use in each nostril as directed    . CETIRIZINE HCL 5 MG PO TABS Oral Take 5 mg by mouth daily.      [name] HYDROCOD POLST-CPM POLST ER 10-8 MG/5ML PO LQCR Oral Take 5 mLs by mouth every 12 (twelve) hours as needed. 140 mL 0  . CLOTRIMAZOLE 10 MG MT LOZG      . DEXLANSOPRAZOLE 60 MG PO CPDR Oral Take 60 mg by mouth daily.      [name] DOCUSATE SODIUM 100 MG PO CAPS Oral Take 100-200 mg by mouth at bedtime. constipation    . FEXOFENADINE HCL 180 MG PO TABS Oral Take 180 mg by mouth daily.    . OMEGA-3 FATTY ACIDS 1000 MG PO CAPS Oral Take 1 g by mouth 3 (three) times daily.      [name] FLUTICASONE PROPIONATE 50 MCG/ACT NA SUSP Nasal Place 2 sprays into the nose daily. 16 g 0  . FUROSEMIDE 40 MG PO TABS Oral Take 40 mg by mouth 2 (two) times daily.       [name] LOSARTAN POTASSIUM 100 MG PO TABS Oral Take 100 mg by mouth daily.      [name] METHOCARBAMOL 500 MG PO TABS Oral Take 500 mg by mouth 2 (two) times daily as needed.     [name] OXYMETAZOLINE HCL 0.05 % NA SOLN Nasal Place 1 spray into the nose at bedtime.      [name] POTASSIUM CHLORIDE CRYS ER 20 MEQ PO TBCR Oral Take 20 mEq by mouth 2 (two) times daily.      [name] ROSUVASTATIN CALCIUM 10 MG PO TABS Oral Take 10 mg by mouth daily.      . TRAMADOL HCL 50 MG PO TABS Oral Take 50 mg by mouth every 6 (six) hours as needed. pain      BP 165/71  Pulse 81  Temp 98.6 F (37 C) (Oral)  Resp 16  Ht 5\' 5"  (1.651 m)  Wt 235 lb (106.595 kg)  BMI 39.11 kg/m2  SpO2 96%  Physical Exam  Nursing note and vitals reviewed. Constitutional: She is oriented to person, place, and time. She appears well-developed and well-nourished. No distress.  HENT:  Head: Normocephalic.  Eyes: Conjunctivae normal and EOM are normal. Pupils are equal, round, and reactive to light.  Neck: No JVD present.  Cardiovascular: Normal rate.   Pulmonary/Chest: Effort normal and breath sounds normal. No stridor. No respiratory distress. She has no wheezes. She has no rales. She exhibits no tenderness.  Abdominal: Soft. Bowel sounds are normal.  Musculoskeletal: Normal range of motion. She exhibits no edema and no tenderness.  Neurological: She is alert and oriented to person, place, and time.  Psychiatric: She has a normal mood and affect.    ED Course  Procedures (including critical care time)  Labs Reviewed  CBC - Abnormal; Notable for the following:    RBC 5.38 (*)     HCT 46.4 (*)     All other components within normal limits  BASIC METABOLIC PANEL - Abnormal; Notable for the following:    Glucose, Bld 170 (*)     BUN 25 (*)     Creatinine, Ser 1.12 (*)     GFR calc non Af Amer 50 (*)     GFR calc Af Amer 58 (*)     All other components within normal  limits  POCT I-STAT TROPONIN I  PRO B NATRIURETIC PEPTIDE   Dg Chest Port 1  View  11/22/2011  *RADIOLOGY REPORT*  Clinical Data: Chest pain.  Weakness.  Short of breath.  PORTABLE CHEST - 1 VIEW  Comparison: 07/09/2011.05/05/2011.  Findings: Cardiopericardial silhouette within normal limits for projection. Right subclavian AICD appears similar to prior.  No airspace disease.  No effusion.  Linear scarring or subsegmental atelectasis present in the left midlung.  Aortic arch atherosclerosis.  IMPRESSION: No acute cardiopulmonary disease.   Original Report Authenticated By: Dereck Ligas, M.D.      Date: 11/22/2011  Rate: 78  Rhythm: Paced rhythm  QRS Axis: right  Intervals: wide QRS, ventricular pacing  ST/T Wave abnormalities: nonspecific ST/T changes  Conduction Disutrbances:none  Narrative Interpretation:   Old EKG Reviewed: unchanged   1. SOB (shortness of breath)   2. Chest pain       MDM  66 y.o.  -year-old female with extensive cardiac history complaining of shortness of breath, fleeting chest pain, and fatigue worsening over the course of several weeks. I doubt this is cardiac in nature. Patient's EKG is nonischemic and unchanged her troponin is negative blood work is unremarkable with a normal BNP additionally patient's chest x-ray is also normal. I will consult [facility] cardiology as she is a patient of Dr. Lowella Fairy.  Cardiology consults from [facility] appreciated: They feel that she is appropriate for admission and further observation and evaluation. Patient is stable I have discussed this with her and she is open to admission.   Monico Blitz, PA-C 11/22/11 1245

## 2011-11-23 ENCOUNTER — Encounter (HOSPITAL_COMMUNITY): Payer: Self-pay

## 2011-11-23 ENCOUNTER — Inpatient Hospital Stay (HOSPITAL_COMMUNITY): Payer: Medicare Other

## 2011-11-23 LAB — HEMOGLOBIN A1C
Hgb A1c MFr Bld: 6.5 % — ABNORMAL HIGH (ref <5.7)
Mean Plasma Glucose: 140 mg/dL — ABNORMAL HIGH (ref <117)

## 2011-11-23 MED ORDER — DIPHENHYDRAMINE HCL 25 MG PO CAPS
25.0000 mg | ORAL_CAPSULE | Freq: Once | ORAL | Status: AC
  Administered 2011-11-23: 25 mg via ORAL

## 2011-11-23 MED ORDER — IOHEXOL 350 MG/ML SOLN
80.0000 mL | Freq: Once | INTRAVENOUS | Status: AC | PRN
  Administered 2011-11-23: 80 mL via INTRAVENOUS

## 2011-11-23 MED ORDER — INFLUENZA VIRUS VACC SPLIT PF IM SUSP: 0.5000 mL | INTRAMUSCULAR | Status: DC

## 2011-11-23 MED ORDER — DIMENHYDRINATE 50 MG PO TABS
50.0000 mg | ORAL_TABLET | Freq: Once | ORAL | Status: DC
  Filled 2011-11-23: qty 1

## 2011-11-23 NOTE — Discharge Summary (Signed)
Patient ID: [name],  MRN: [id number], DOB/AGE: January 07, 1946 66 y.o.  Admit date: 11/22/2011 Discharge date: 11/23/2011  Primary Care Provider:  Primary Cardiologist: Dr Rollene Fare  Discharge Diagnoses Principal Problem:  *Weakness Active Problems:  CAD, non obstructive by cath 2010  PTVDP 2001, upgraded to BS BiV device with good response 2008  Cardiomyopathy, nonischemic, Bi V responder, Last EF >55%  Back pain  HYPERTENSION  Morbid obesity  COPD (chronic obstructive pulmonary disease), asthmatic component  GERD (gastroesophageal reflux disease), BA swallow last week "OK"  Sleep apnea, C-pap intol    Procedures: CTA chest, 2D echo   Hospital Course: 66 y/o morbidly obese female with a history of non obstructive CAD by cath 2010 (40% LAD). She has a history of PTVDP in 2001 with an upgrade to a BiV in 2008 for NICM (EF 15-20% then). She is a responder, EF >55% in 2012. She just saw Dr Rollene Fare in the office 11/06/11. She has been having mid scapular pain and DOE. Myoview was done in our office last week and was low risk. She came to the ER 11/22/11 with complaints "weakness". She says she "just couldn't get going". She denied chest pain to me but did admit to mid scapular pain that she has been having for some time now. She is under a lot stress at home, her husband has recurrent head and neck cancer. Troponin and BNP WNL. She was premedicated for a history of contrast allergy and underwent chest CTA to r/o aortic dissection. This revealed no evidence of PE or dissection. An echo was done and these results are pending at the time of discharge.   Discharge Vitals:  Blood pressure 122/74, pulse 77, temperature 98.5 F (36.9 C), temperature source Oral, resp. rate 16, height 5\' 5"  (1.651 m), weight 106.595 kg (235 lb), SpO2 94.00%.    Labs: Results for orders placed during the hospital encounter of 11/22/11 (from the past 48 hour(s))  CBC     Status: Abnormal   Collection  Time   11/22/11  8:20 AM      Component Value Range Comment   WBC 7.3  4.0 - 10.5 K/uL    RBC 5.38 (*) 3.87 - 5.11 MIL/uL    Hemoglobin 15.0  12.0 - 15.0 g/dL    HCT 46.4 (*) 36.0 - 46.0 %    MCV 86.2  78.0 - 100.0 fL    MCH 27.9  26.0 - 34.0 pg    MCHC 32.3  30.0 - 36.0 g/dL    RDW 14.8  11.5 - 15.5 %    Platelets 180  150 - 400 K/uL   BASIC METABOLIC PANEL     Status: Abnormal   Collection Time   11/22/11  8:20 AM      Component Value Range Comment   Sodium 140  135 - 145 mEq/L    Potassium 4.6  3.5 - 5.1 mEq/L    Chloride 103  96 - 112 mEq/L    CO2 26  19 - 32 mEq/L    Glucose, Bld 170 (*) 70 - 99 mg/dL    BUN 25 (*) 6 - 23 mg/dL    Creatinine, Ser 1.12 (*) 0.50 - 1.10 mg/dL    Calcium 10.2  8.4 - 10.5 mg/dL    GFR calc non Af Amer 50 (*) >90 mL/min    GFR calc Af Amer 58 (*) >90 mL/min   PRO B NATRIURETIC PEPTIDE     Status: Abnormal   Collection Time  11/22/11  8:21 AM      Component Value Range Comment   Pro B Natriuretic peptide (BNP) 189.0 (*) 0 - 125 pg/mL   POCT I-STAT TROPONIN I     Status: Normal   Collection Time   11/22/11  8:45 AM      Component Value Range Comment   Troponin i, poc 0.00  0.00 - 0.08 ng/mL    Comment 3            HEMOGLOBIN A1C     Status: Abnormal   Collection Time   11/22/11 12:22 PM      Component Value Range Comment   Hemoglobin A1C 6.5 (*) <5.7 %    Mean Plasma Glucose 140 (*) <117 mg/dL   URINALYSIS, ROUTINE W REFLEX MICROSCOPIC     Status: Normal   Collection Time   11/22/11  2:43 PM      Component Value Range Comment   Color, Urine YELLOW  YELLOW    APPearance CLEAR  CLEAR    Specific Gravity, Urine 1.025  1.005 - 1.030    pH 5.5  5.0 - 8.0    Glucose, UA NEGATIVE  NEGATIVE mg/dL    Hgb urine dipstick NEGATIVE  NEGATIVE    Bilirubin Urine NEGATIVE  NEGATIVE    Ketones, ur NEGATIVE  NEGATIVE mg/dL    Protein, ur NEGATIVE  NEGATIVE mg/dL    Urobilinogen, UA 0.2  0.0 - 1.0 mg/dL    Nitrite NEGATIVE  NEGATIVE     Leukocytes, UA NEGATIVE  NEGATIVE MICROSCOPIC NOT DONE ON URINES WITH NEGATIVE PROTEIN, BLOOD, LEUKOCYTES, NITRITE, OR GLUCOSE <1000 mg/dL.  TROPONIN I     Status: Normal   Collection Time   11/22/11 10:22 PM      Component Value Range Comment   Troponin I <0.30  <0.30 ng/mL     Disposition:  Follow-up Information    Follow up with Rebecca Eaton, MD. (office will call you)    Contact information:   [address]  [place] [address] [phone]          Discharge Medications:    Medication List     As of 11/23/2011  1:46 PM    TAKE these medications         albuterol 108 (90 BASE) MCG/ACT inhaler   Commonly known as: PROVENTIL HFA;VENTOLIN HFA   Inhale 2 puffs into the lungs every 6 (six) hours as needed. For shortness of breath      amLODipine 10 MG tablet   Commonly known as: NORVASC   Take 10 mg by mouth daily.      aspirin EC 81 MG tablet   Take 81 mg by mouth daily.      budesonide-formoterol 160-4.5 MCG/ACT inhaler   Commonly known as: SYMBICORT   Inhale 2 puffs into the lungs 2 (two) times daily.      busPIRone 5 MG tablet   Commonly known as: BUSPAR   Take 5 mg by mouth 2 (two) times daily.      cetirizine 5 MG tablet   Commonly known as: ZYRTEC   Take 5 mg by mouth daily.      docusate sodium 100 MG capsule   Commonly known as: COLACE   Take 100-200 mg by mouth at bedtime. constipation      ASK your doctor about these medications         fluticasone 50 MCG/ACT nasal spray   Commonly known as:  FLONASE   Place 2 sprays into the nose daily.      furosemide 40 MG tablet   Commonly known as: LASIX   Take 40 mg by mouth 2 (two) times daily.      losartan 100 MG tablet   Commonly known as: COZAAR   Take 100 mg by mouth 2 (two) times daily.      methocarbamol 500 MG tablet   Commonly known as: ROBAXIN   Take 500 mg by mouth 2 (two) times daily as needed. For spasms      metoprolol succinate 50 MG 24 hr tablet    Commonly known as: TOPROL-XL   Take 25 mg by mouth 2 (two) times daily.      omeprazole 20 MG capsule   Commonly known as: PRILOSEC   Take 20 mg by mouth 3 (three) times daily.      oxymetazoline 0.05 % nasal spray   Commonly known as: AFRIN   Place 1 spray into the nose at bedtime as needed. For congestion      potassium chloride SA 20 MEQ tablet   Commonly known as: K-DUR,KLOR-CON   Take 20 mEq by mouth 2 (two) times daily.      rosuvastatin 10 MG tablet   Commonly known as: CRESTOR   Take 10 mg by mouth daily.      spironolactone 25 MG tablet   Commonly known as: ALDACTONE   Take 12.5 mg by mouth 2 (two) times daily.      traMADol 50 MG tablet   Commonly known as: ULTRAM   Take 50 mg by mouth every 6 (six) hours as needed. pain         Duration of Discharge Encounter: Greater than 30 minutes including physician time.  Angelena Form PA-C 11/23/2011 1:46 PM

## 2011-11-23 NOTE — Progress Notes (Signed)
  Echocardiogram 2D Echocardiogram has been performed.  [name], [place] 11/23/2011, 11:42 AM

## 2011-11-23 NOTE — Progress Notes (Signed)
Subjective:  Sore throat, no chest pain.  Objective:  Vital Signs in the last 24 hours: Temp:  [97.9 F (36.6 C)-98.6 F (37 C)] 98.5 F (36.9 C) (10/13 0500) Pulse Rate:  [69-77] 77  (10/13 0500) Resp:  [11-23] 16  (10/13 0500) BP: (97-155)/(67-85) 97/67 mmHg (10/13 0500) SpO2:  [92 %-98 %] 94 % (10/13 0859)  Intake/Output from previous day:  Intake/Output Summary (Last 24 hours) at 11/23/11 0913 Last data filed at 11/23/11 0900  Gross per 24 hour  Intake    600 ml  Output    350 ml  Net    250 ml    Physical Exam: General appearance: alert, cooperative, no distress and morbidly obese Lungs: clear to auscultation bilaterally Heart: regular rate and rhythm   Rate: 76  Rhythm: paced  Lab Results:  Basename 11/22/11 0820  WBC 7.3  HGB 15.0  PLT 180    Basename 11/22/11 0820  NA 140  K 4.6  CL 103  CO2 26  GLUCOSE 170*  BUN 25*  CREATININE 1.12*    Basename 11/22/11 2222  TROPONINI <0.30   Hepatic Function Panel No results found for this basename: PROT,ALBUMIN,AST,ALT,ALKPHOS,BILITOT,BILIDIR,IBILI in the last 72 hours No results found for this basename: CHOL in the last 72 hours No results found for this basename: INR in the last 72 hours  Imaging: Imaging results have been reviewed  Cardiac Studies:  Assessment/Plan:   Principal Problem:  *Weakness Active Problems:  CAD, non obstructive by cath 2010  PTVDP 2001, upgraded to BS BiV device with good response 2008  Cardiomyopathy, nonischemic, Bi V responder, Last EF >55%  Back pain  HYPERTENSION  Morbid obesity  COPD (chronic obstructive pulmonary disease), asthmatic component  GERD (gastroesophageal reflux disease), BA swallow last week "OK"  Sleep apnea, C-pap intol  Plan- Myoview from our office was low risk 11/14/11. CTA today to r/o Ao dissection, if negative home later. She needs to re fitted for C-pap.   Kerin Ransom PA-C 11/23/2011, 9:13 AM   I have seen and examined the patient  along with Kerin Ransom PA-C.  I have reviewed the chart, notes and new data.  I agree with PA's note.  DC home if CTA and echo do not show new pathology.   Sanda Klein, MD, [place] [phone] 11/23/2011, 9:54 AM

## 2011-11-25 ENCOUNTER — Encounter: Payer: Self-pay | Admitting: Internal Medicine

## 2011-11-25 ENCOUNTER — Ambulatory Visit (INDEPENDENT_AMBULATORY_CARE_PROVIDER_SITE_OTHER): Payer: Medicare Other | Admitting: Internal Medicine

## 2011-11-25 VITALS — BP 144/90 | HR 72 | Ht 62.5 in | Wt 243.2 lb

## 2011-11-25 DIAGNOSIS — R109 Unspecified abdominal pain: Secondary | ICD-10-CM

## 2011-11-25 DIAGNOSIS — K589 Irritable bowel syndrome without diarrhea: Secondary | ICD-10-CM

## 2011-11-25 DIAGNOSIS — R1013 Epigastric pain: Secondary | ICD-10-CM

## 2011-11-25 DIAGNOSIS — K3189 Other diseases of stomach and duodenum: Secondary | ICD-10-CM

## 2011-11-25 NOTE — Patient Instructions (Addendum)
You have been scheduled for an abdominal ultrasound at [facility] (1st floor of hospital) on Monday, 12/01/11 at 8:00 am . Please arrive 15 minutes prior to your appointment for registration. Make certain not to have anything to eat or drink 6 hours prior to your appointment. Should you need to reschedule your appointment, please contact radiology at [phone]. CC: Dr Jonathon Jordan

## 2011-11-25 NOTE — Progress Notes (Signed)
[name] August 31, 1945 MRN [id number]  History of Present Illness:  This is a 66 year old white female with a history of irritable bowel syndrome, upper abdominal discomfort, early satiety, bloating and stress due to her husband's cancer. She was started on Prilosec 20 mg 3 times a day on October 1 and has noticed improvement. She also takes Gaviscon with each meal. Her last upper endoscopy in November 2008 showed mild antral gastritis attributed to NSAID's. Her CLOtest was negative. She has been recently hospitalized with shortness of breath and asthmatic bronchitis and was put on steroids. We did a colonoscopy in 2004 which showed multiple colon polyps. I could not find a pathology report on these polyps. She has been treated for congestive heart failure, obstructive sleep apnea, morbid obesity and arrhythmia.   Past Medical History  Diagnosis Date  . Asthma   . Hypertension   . Irregular heart rate   . Arthritis   . CHF (congestive heart failure)   . GERD (gastroesophageal reflux disease)   . Diverticulosis   . Anemia   . CAD (coronary artery disease)   . Fatty liver disease, nonalcoholic   . Internal hemorrhoids   . Gastritis   . Hyperplastic colon polyp   . Sleep apnea   . ICD (implantable cardiac defibrillator) in place   . Shortness of breath   . Pacemaker   . IBS (irritable bowel syndrome)    Past Surgical History  Procedure Date  . Pacemaker insertion     x 2  . Cardiac defibrillator placement   . Appendectomy   . Knee surgery     right  . Ankle surgery     right  . Tubal ligation   . Cervical spine surgery     plate     reports that she has quit smoking. Her smoking use included Cigarettes. She has never used smokeless tobacco. She reports that she does not drink alcohol or use illicit drugs. family history includes Breast cancer in her mother; Diabetes in her maternal grandmother and mother; Glaucoma in her maternal aunt; Heart disease in her maternal aunt  and maternal grandmother; and Kidney disease in her maternal grandmother. Allergies  Allergen Reactions  . Ivp Dye (Iodinated Diagnostic Agents) Shortness Of Breath  . Shellfish-Derived Products Anaphylaxis  . Sulfa Antibiotics Shortness Of Breath  . Iodine Hives        Review of Systems: Denies dysphagia. Positive for heartburn and epigastric discomfort  The remainder of the 10 point ROS is negative except as outlined in H&P   Physical Exam: General appearance  Well developed, in no distress. Eyes- non icteric. HEENT nontraumatic, normocephalic. Mouth no lesions, tongue papillated, no cheilosis. Neck supple without adenopathy, thyroid not enlarged, no carotid bruits, no JVD. Lungs Clear to auscultation bilaterally. Cor normal S1, normal S2, regular rhythm, no murmur,  quiet precordium. Abdomen: Massively obese soft with tenderness in subxiphoid area. Liver edge at costal margin. No ascites. Rectal: Not done. Extremities no pedal edema. Skin no lesions. Neurological alert and oriented x 3. Psychological normal mood and affect.  Assessment and Plan:  Problem #1 Nonspecific symptoms of bloating and dyspepsia most likely due to irritable bowel syndrome and stress. I have given her samples of Digex to take with meals. I have also given her samples of Nexium 40 mg daily which will last her for 2 weeks. She has limited resources as far as her prescription medications are concerned. We will do an upper abdominal ultrasound to rule out  symptomatic gallbladder disease. If the symptoms continue, she will need upper and lower endoscopy.    11/25/2011 Delfin Edis

## 2011-12-01 ENCOUNTER — Ambulatory Visit (HOSPITAL_COMMUNITY)
Admission: RE | Admit: 2011-12-01 | Discharge: 2011-12-01 | Disposition: A | Discharge status: Home / Self Care | Payer: Medicare Other | Source: Ambulatory Visit | Attending: Internal Medicine | Admitting: Internal Medicine

## 2011-12-01 DIAGNOSIS — R109 Unspecified abdominal pain: Secondary | ICD-10-CM | POA: Insufficient documentation

## 2011-12-01 HISTORY — PX: OTHER SURGICAL HISTORY: SHX169

## 2012-07-04 IMAGING — CR DG THORACIC SPINE 3V
4 series · 4 of 4 positions shown · non-contrast
Comparison: Chest x-ray of 05/05/2011

CLINICAL DATA: Mid thoracic back pain

THORACIC SPINE - 2 VIEW + SWIMMERS

[t t-spine a.p. *]
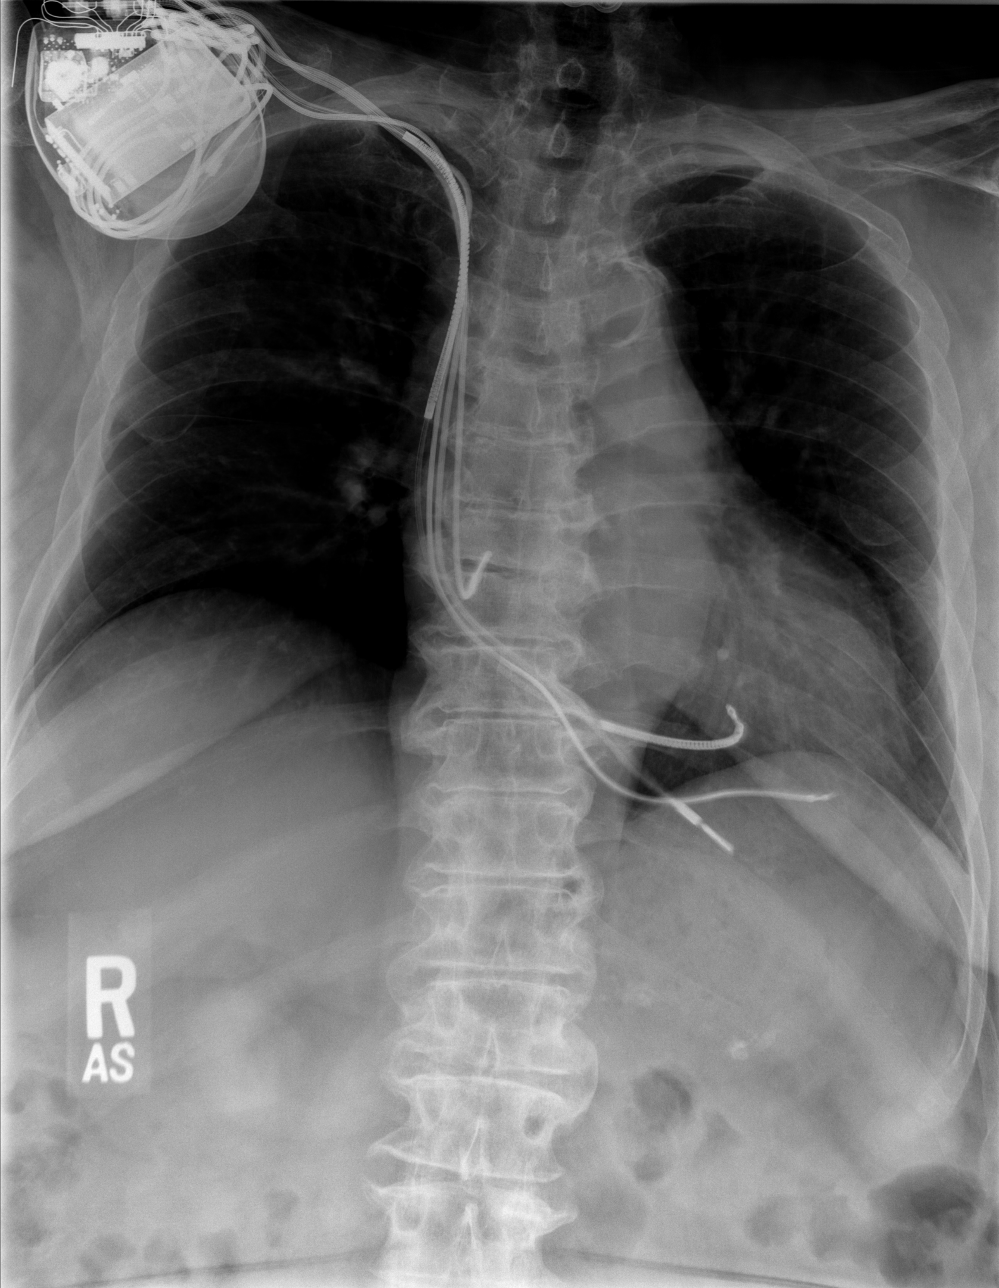

[t t-spine lat * (1 of 2)]
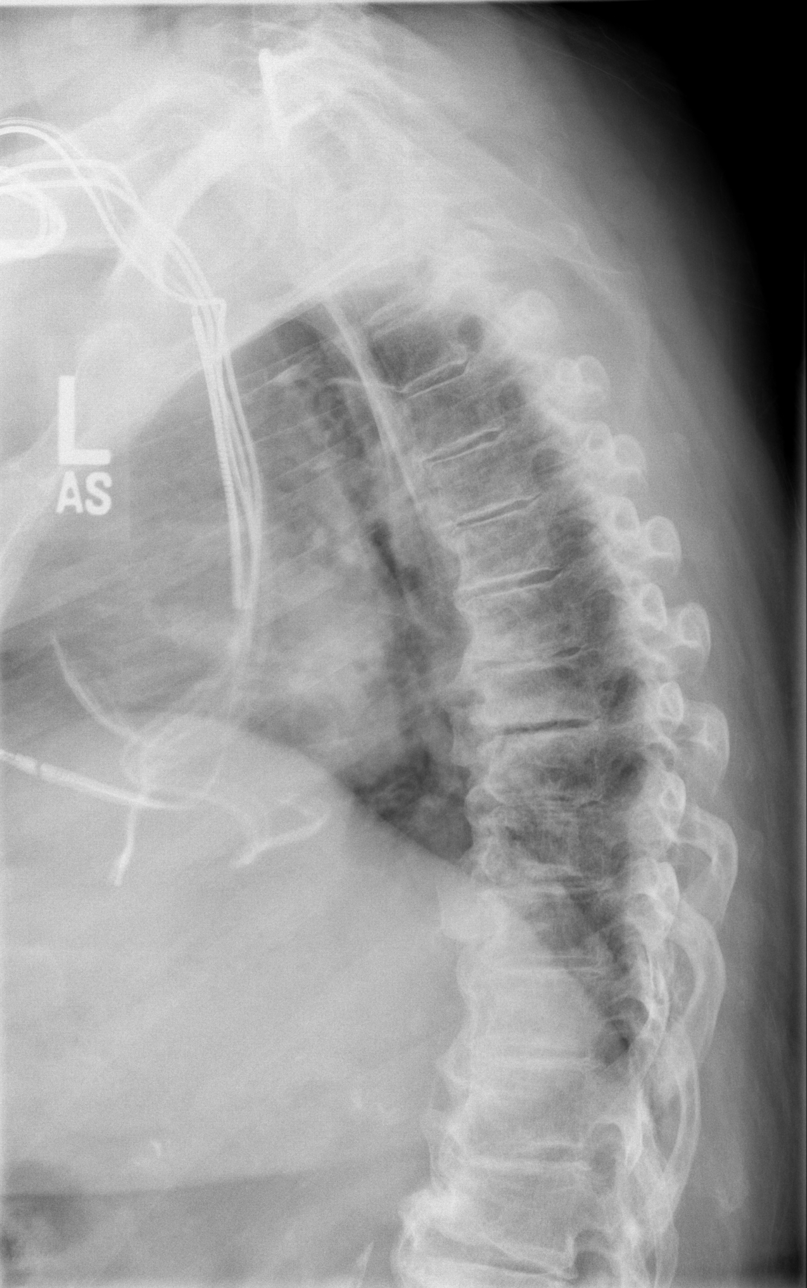

[t t-spine lat * (2 of 2)]
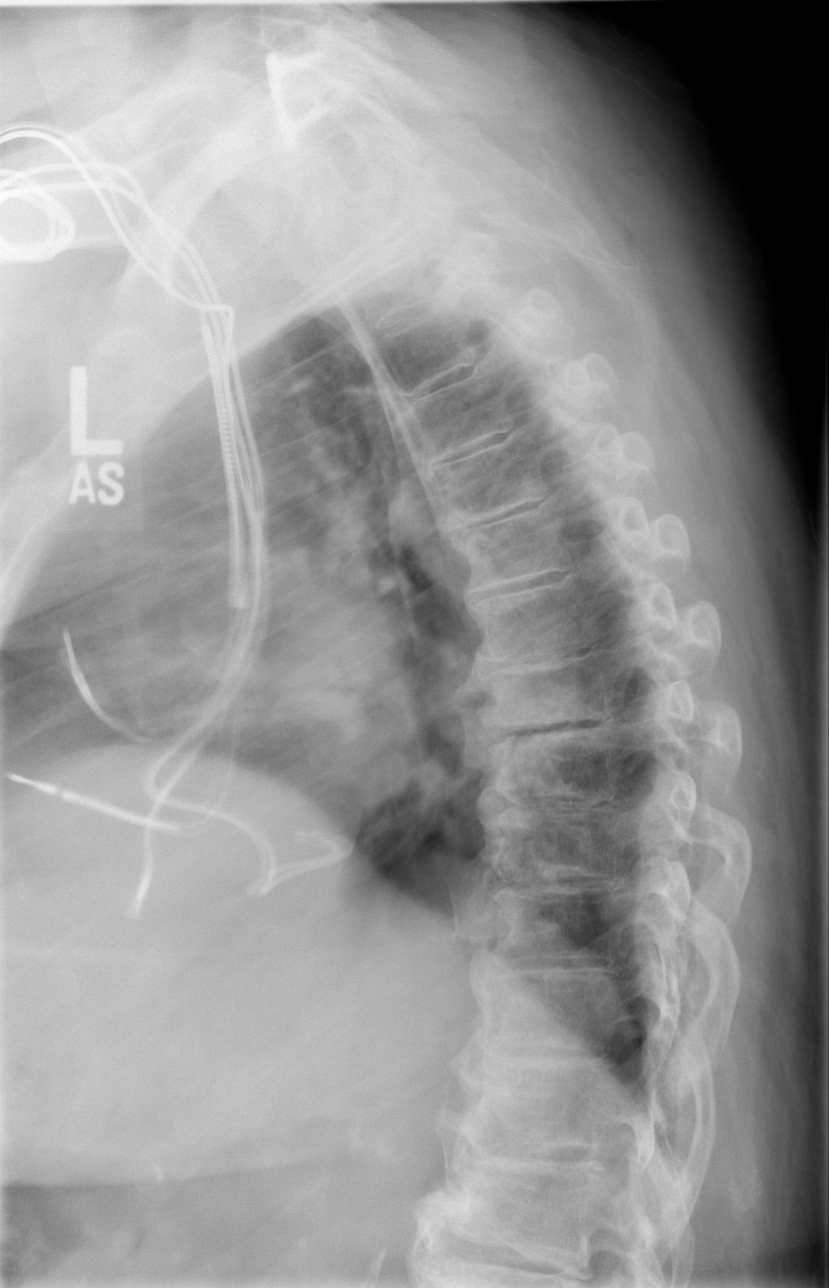

[t swimmers]
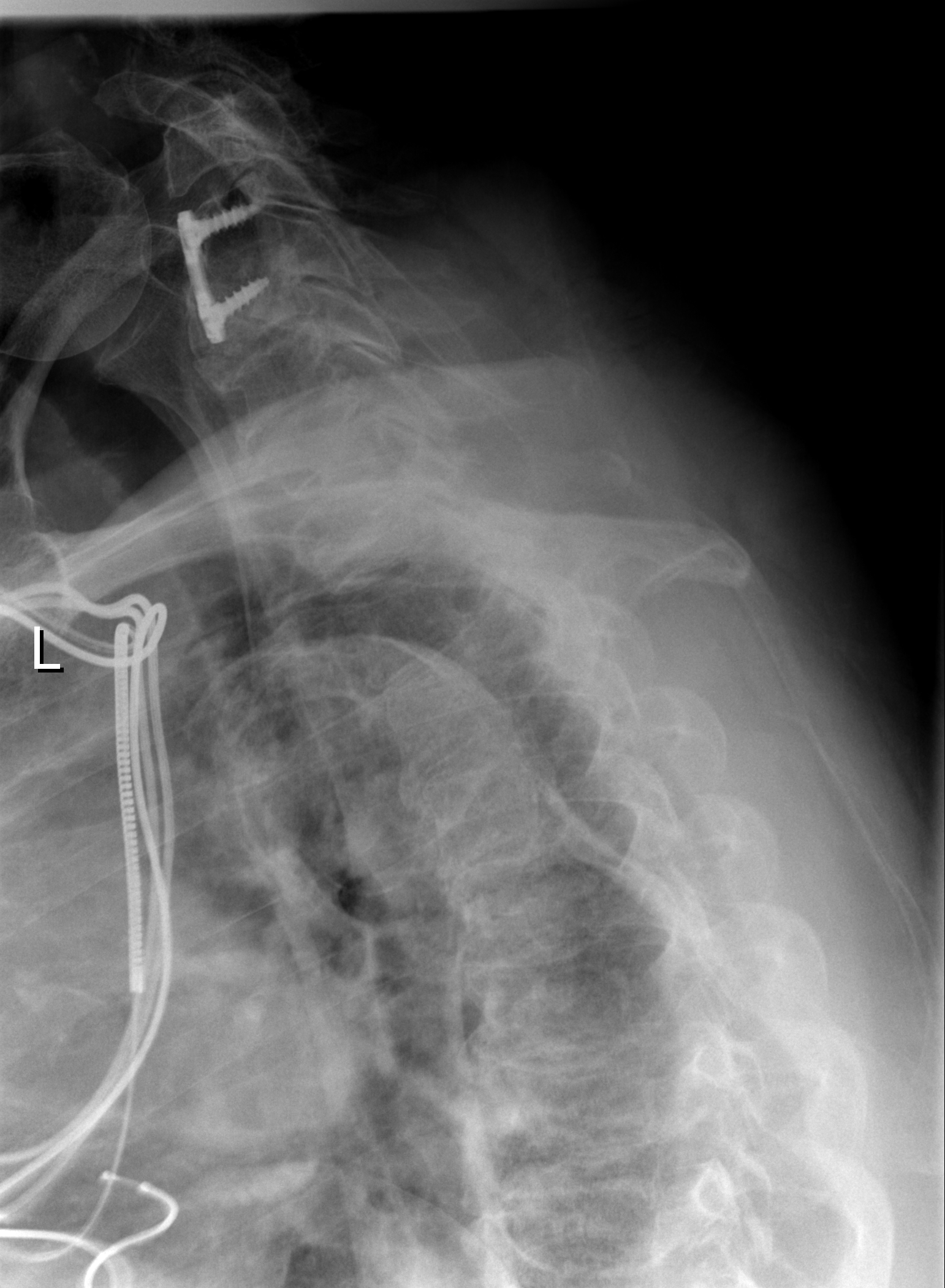

[4 of 4 positions shown; findings below may reference images not displayed]

FINDINGS: There are diffuse degenerative changes throughout the
thoracic spine with loss of disc space and spurring.  No
compression deformity is seen.  No paravertebral soft tissue
swelling is noted.  A permanent pacemaker is present.
IMPRESSION: Diffuse degenerative change throughout the thoracic spine.  No
compression deformity.

## 2012-07-09 ENCOUNTER — Telehealth: Payer: Self-pay | Admitting: Cardiovascular Disease

## 2012-07-09 MED ORDER — METOPROLOL SUCCINATE ER 50 MG PO TB24: ORAL_TABLET | ORAL | Status: DC

## 2012-07-09 NOTE — Telephone Encounter (Signed)
rx printed for dr Rollene Fare to sign

## 2012-07-09 NOTE — Telephone Encounter (Signed)
Been waiting for her medicine since last week-Would you please call it in today-She is completely out of it!Its her Metoprolol-Please call it to [facility] on [address]

## 2012-07-22 ENCOUNTER — Emergency Department (HOSPITAL_COMMUNITY): Payer: Medicare Other

## 2012-07-22 ENCOUNTER — Inpatient Hospital Stay (HOSPITAL_COMMUNITY)
Admission: EM | Admit: 2012-07-22 | Discharge: 2012-07-24 | DRG: 287 | Disposition: A | Discharge status: Home / Self Care | Payer: Medicare Other | Attending: Cardiology | Admitting: Cardiology

## 2012-07-22 ENCOUNTER — Encounter (HOSPITAL_COMMUNITY): Payer: Self-pay | Admitting: Adult Health

## 2012-07-22 DIAGNOSIS — I5030 Unspecified diastolic (congestive) heart failure: Secondary | ICD-10-CM | POA: Diagnosis present

## 2012-07-22 DIAGNOSIS — I429 Cardiomyopathy, unspecified: Secondary | ICD-10-CM | POA: Diagnosis present

## 2012-07-22 DIAGNOSIS — Z91041 Radiographic dye allergy status: Secondary | ICD-10-CM

## 2012-07-22 DIAGNOSIS — I509 Heart failure, unspecified: Secondary | ICD-10-CM | POA: Diagnosis present

## 2012-07-22 DIAGNOSIS — Z9851 Tubal ligation status: Secondary | ICD-10-CM

## 2012-07-22 DIAGNOSIS — Z882 Allergy status to sulfonamides status: Secondary | ICD-10-CM

## 2012-07-22 DIAGNOSIS — J449 Chronic obstructive pulmonary disease, unspecified: Secondary | ICD-10-CM

## 2012-07-22 DIAGNOSIS — Z833 Family history of diabetes mellitus: Secondary | ICD-10-CM

## 2012-07-22 DIAGNOSIS — Z950 Presence of cardiac pacemaker: Secondary | ICD-10-CM | POA: Diagnosis present

## 2012-07-22 DIAGNOSIS — Z7982 Long term (current) use of aspirin: Secondary | ICD-10-CM

## 2012-07-22 DIAGNOSIS — J4489 Other specified chronic obstructive pulmonary disease: Secondary | ICD-10-CM | POA: Diagnosis present

## 2012-07-22 DIAGNOSIS — Z841 Family history of disorders of kidney and ureter: Secondary | ICD-10-CM

## 2012-07-22 DIAGNOSIS — Z8249 Family history of ischemic heart disease and other diseases of the circulatory system: Secondary | ICD-10-CM

## 2012-07-22 DIAGNOSIS — Z6841 Body Mass Index (BMI) 40.0 and over, adult: Secondary | ICD-10-CM

## 2012-07-22 DIAGNOSIS — G4730 Sleep apnea, unspecified: Secondary | ICD-10-CM | POA: Diagnosis present

## 2012-07-22 DIAGNOSIS — Z9089 Acquired absence of other organs: Secondary | ICD-10-CM

## 2012-07-22 DIAGNOSIS — Z91013 Allergy to seafood: Secondary | ICD-10-CM

## 2012-07-22 DIAGNOSIS — G4733 Obstructive sleep apnea (adult) (pediatric): Secondary | ICD-10-CM | POA: Diagnosis present

## 2012-07-22 DIAGNOSIS — I428 Other cardiomyopathies: Secondary | ICD-10-CM

## 2012-07-22 DIAGNOSIS — E669 Obesity, unspecified: Secondary | ICD-10-CM | POA: Diagnosis present

## 2012-07-22 DIAGNOSIS — J45909 Unspecified asthma, uncomplicated: Secondary | ICD-10-CM | POA: Diagnosis present

## 2012-07-22 DIAGNOSIS — K7689 Other specified diseases of liver: Secondary | ICD-10-CM | POA: Diagnosis present

## 2012-07-22 DIAGNOSIS — K589 Irritable bowel syndrome without diarrhea: Secondary | ICD-10-CM | POA: Diagnosis present

## 2012-07-22 DIAGNOSIS — I110 Hypertensive heart disease with heart failure: Secondary | ICD-10-CM

## 2012-07-22 DIAGNOSIS — I200 Unstable angina: Secondary | ICD-10-CM | POA: Diagnosis present

## 2012-07-22 DIAGNOSIS — Z95810 Presence of automatic (implantable) cardiac defibrillator: Secondary | ICD-10-CM

## 2012-07-22 DIAGNOSIS — R079 Chest pain, unspecified: Secondary | ICD-10-CM

## 2012-07-22 DIAGNOSIS — I10 Essential (primary) hypertension: Secondary | ICD-10-CM

## 2012-07-22 DIAGNOSIS — Z803 Family history of malignant neoplasm of breast: Secondary | ICD-10-CM

## 2012-07-22 DIAGNOSIS — K219 Gastro-esophageal reflux disease without esophagitis: Secondary | ICD-10-CM | POA: Diagnosis present

## 2012-07-22 DIAGNOSIS — Z79899 Other long term (current) drug therapy: Secondary | ICD-10-CM

## 2012-07-22 DIAGNOSIS — Z87891 Personal history of nicotine dependence: Secondary | ICD-10-CM

## 2012-07-22 DIAGNOSIS — I2510 Atherosclerotic heart disease of native coronary artery without angina pectoris: Principal | ICD-10-CM | POA: Diagnosis present

## 2012-07-22 HISTORY — DX: Personal history of other diseases of the digestive system: Z87.19

## 2012-07-22 HISTORY — DX: Obesity, unspecified: E66.9

## 2012-07-22 LAB — BASIC METABOLIC PANEL
BUN: 22 mg/dL (ref 6–23)
CO2: 26 mEq/L (ref 19–32)
Calcium: 9.6 mg/dL (ref 8.4–10.5)
Chloride: 104 mEq/L (ref 96–112)
Creatinine, Ser: 0.95 mg/dL (ref 0.50–1.10)
GFR calc Af Amer: 70 mL/min — ABNORMAL LOW (ref >90)
GFR calc non Af Amer: 60 mL/min — ABNORMAL LOW (ref >90)
Glucose, Bld: 103 mg/dL — ABNORMAL HIGH (ref 70–99)
Potassium: 4 mEq/L (ref 3.5–5.1)
Sodium: 138 mEq/L (ref 135–145)

## 2012-07-22 LAB — CBC
HCT: 42.6 % (ref 36.0–46.0)
Hemoglobin: 14.2 g/dL (ref 12.0–15.0)
MCH: 28.6 pg (ref 26.0–34.0)
MCHC: 33.3 g/dL (ref 30.0–36.0)
MCV: 85.9 fL (ref 78.0–100.0)
Platelets: 173 10*3/uL (ref 150–400)
RBC: 4.96 MIL/uL (ref 3.87–5.11)
RDW: 14.3 % (ref 11.5–15.5)
WBC: 7.3 10*3/uL (ref 4.0–10.5)

## 2012-07-22 LAB — POCT I-STAT TROPONIN I: Troponin i, poc: 0 ng/mL (ref 0.00–0.08)

## 2012-07-22 LAB — PRO B NATRIURETIC PEPTIDE: Pro B Natriuretic peptide (BNP): 178.9 pg/mL — ABNORMAL HIGH (ref 0–125)

## 2012-07-22 MED ORDER — ASPIRIN 81 MG PO CHEW
324.0000 mg | CHEWABLE_TABLET | Freq: Once | ORAL | Status: AC
  Administered 2012-07-22: 324 mg via ORAL
  Filled 2012-07-22: qty 4

## 2012-07-22 MED ORDER — MORPHINE SULFATE 4 MG/ML IJ SOLN
4.0000 mg | Freq: Once | INTRAMUSCULAR | Status: AC
  Administered 2012-07-22: 4 mg via INTRAVENOUS
  Filled 2012-07-22: qty 1

## 2012-07-22 MED ORDER — ASPIRIN 325 MG PO TABS: 325.0000 mg | ORAL_TABLET | ORAL | Status: DC

## 2012-07-22 MED ORDER — NITROGLYCERIN 0.4 MG SL SUBL: 0.4000 mg | SUBLINGUAL_TABLET | SUBLINGUAL | Status: DC | PRN

## 2012-07-22 NOTE — H&P (Signed)
PCP:  [name], MD  Cardiology [facility] Pulm: Genevie Cheshire Delfin Edis Chief Complaint:   Chest pain  HPI: [name] is a 67 y.o. female   has a past medical history of Asthma; Hypertension; Irregular heart rate; Arthritis; CHF (congestive heart failure); GERD (gastroesophageal reflux disease); Diverticulosis; Anemia; CAD (coronary artery disease); Fatty liver disease, nonalcoholic; Internal hemorrhoids; Gastritis; Hyperplastic colon polyp; Sleep apnea; ICD (implantable cardiac defibrillator) in place; Shortness of breath; Pacemaker; and IBS (irritable bowel syndrome).   Presented with  1 week hx of Chest discomfort described as crushing pain like something on top  Of her chest. It is coming and going lasting for 30-40 min a time. Worse with activity. Associated with shortness of breath.  She tried to mop her floor but had to sit down and rest. She never had a pain like this in the past last time she required cardiac a cath 2010 her pain was different. She has hx of non obstructive CAD by cath 2010 (40% LAD) and PTVDP in 2001 with an upgrade to a BiV in 2008 for NICM (EF 15-20% then). Most recent EF was 55%.  Patient states she still have persistent pain so far troponin is negative. She received 4 aspirins with partial relief.   Hospitalist called for an admission  Review of Systems:    Pertinent positives include: chest pain, shortness of breath at rest.   dyspnea on exertion,   Constitutional:  No weight loss, night sweats, Fevers, chills, fatigue, weight loss  HEENT:  No headaches, Difficulty swallowing,Tooth/dental problems,Sore throat,  No sneezing, itching, ear ache, nasal congestion, post nasal drip,  Cardio-vascular:  No  Orthopnea, PND, anasarca, dizziness, palpitations.no Bilateral lower extremity swelling  GI:  No heartburn, indigestion, abdominal pain, nausea, vomiting, diarrhea, change in bowel habits, loss of appetite, melena, blood in stool,  hematemesis Resp:  noNo excess mucus, no productive cough, No non-productive cough, No coughing up of blood.No change in color of mucus.No wheezing. Skin:  no rash or lesions. No jaundice GU:  no dysuria, change in color of urine, no urgency or frequency. No straining to urinate.  No flank pain.  Musculoskeletal:  No joint pain or no joint swelling. No decreased range of motion. No back pain.  Psych:  No change in mood or affect. No depression or anxiety. No memory loss.  Neuro: no localizing neurological complaints, no tingling, no weakness, no double vision, no gait abnormality, no slurred speech, no confusion  Otherwise ROS are negative except for above, 10 systems were reviewed  Past Medical History: Past Medical History  Diagnosis Date  . Asthma   . Hypertension   . Irregular heart rate   . Arthritis   . CHF (congestive heart failure)   . GERD (gastroesophageal reflux disease)   . Diverticulosis   . Anemia   . CAD (coronary artery disease)   . Fatty liver disease, nonalcoholic   . Internal hemorrhoids   . Gastritis   . Hyperplastic colon polyp   . Sleep apnea   . ICD (implantable cardiac defibrillator) in place   . Shortness of breath   . Pacemaker   . IBS (irritable bowel syndrome)    Past Surgical History  Procedure Laterality Date  . Pacemaker insertion      x 2  . Cardiac defibrillator placement    . Appendectomy    . Knee surgery      right  . Ankle surgery      right  .  Tubal ligation    . Cervical spine surgery      plate      Medications: Prior to Admission medications   Medication Sig Start Date End Date Taking? Authorizing Provider  albuterol (PROVENTIL HFA;VENTOLIN HFA) 108 (90 BASE) MCG/ACT inhaler Inhale 2 puffs into the lungs every 6 (six) hours as needed. For shortness of breath   Yes Historical Provider, MD  amLODipine (NORVASC) 10 MG tablet Take 10 mg by mouth daily.   Yes Historical Provider, MD  aspirin EC 81 MG tablet Take 81 mg by  mouth daily.   Yes Historical Provider, MD  budesonide-formoterol (SYMBICORT) 160-4.5 MCG/ACT inhaler Inhale 2 puffs into the lungs 2 (two) times daily.   Yes Historical Provider, MD  busPIRone (BUSPAR) 5 MG tablet Take 5 mg by mouth 2 (two) times daily as needed (for nerves).    Yes Historical Provider, MD  cetirizine (ZYRTEC) 10 MG tablet Take 10 mg by mouth daily as needed for allergies.   Yes Historical Provider, MD  docusate sodium (COLACE) 100 MG capsule Take 100 mg by mouth at bedtime. constipation   Yes Historical Provider, MD  fluticasone (FLONASE) 50 MCG/ACT nasal spray Place 2 sprays into the nose daily as needed for rhinitis or allergies.    Yes Historical Provider, MD  furosemide (LASIX) 40 MG tablet Take 40 mg by mouth 2 (two) times daily.     Yes Historical Provider, MD  guaifenesin (HUMIBID E) 400 MG TABS Take 400 mg by mouth at bedtime as needed (congestion).   Yes Historical Provider, MD  losartan (COZAAR) 100 MG tablet Take 100 mg by mouth 2 (two) times daily.    Yes Historical Provider, MD  metoprolol succinate (TOPROL-XL) 50 MG 24 hr tablet Take 75 mg by mouth every morning.   Yes Historical Provider, MD  omeprazole (PRILOSEC) 20 MG capsule Take 20 mg by mouth 3 (three) times daily.   Yes Historical Provider, MD  oxymetazoline (AFRIN) 0.05 % nasal spray Place 1 spray into the nose at bedtime as needed. For congestion   Yes Historical Provider, MD  potassium chloride SA (K-DUR,KLOR-CON) 20 MEQ tablet Take 20 mEq by mouth 2 (two) times daily.     Yes Historical Provider, MD  pravastatin (PRAVACHOL) 40 MG tablet Take 40 mg by mouth daily.   Yes Historical Provider, MD  spironolactone (ALDACTONE) 25 MG tablet Take 12.5 mg by mouth 2 (two) times daily.     Yes Historical Provider, MD  traMADol (ULTRAM) 50 MG tablet Take 50 mg by mouth every 6 (six) hours as needed. pain   Yes Historical Provider, MD    Allergies:   Allergies  Allergen Reactions  . Ivp Dye (Iodinated Diagnostic  Agents) Shortness Of Breath  . Shellfish-Derived Products Anaphylaxis  . Sulfa Antibiotics Shortness Of Breath  . Iodine Hives    Social History:  Ambulatory independently   Lives at  home   reports that she has quit smoking. Her smoking use included Cigarettes. She smoked 0.00 packs per day. She has never used smokeless tobacco. She reports that she does not drink alcohol or use illicit drugs.   Family History: family history includes Breast cancer in her mother; Diabetes in her maternal grandmother and mother; Glaucoma in her maternal aunt; Heart disease in her maternal aunt and maternal grandmother; and Kidney disease in her maternal grandmother.    Physical Exam: Patient Vitals for the past 24 hrs:  BP Temp Temp src Pulse Resp SpO2 Weight  07/22/12  2200 160/81 mmHg - - 69 13 93 % -  07/22/12 2145 175/86 mmHg - - 69 10 95 % -  07/22/12 1858 160/71 mmHg 98.7 F (37.1 C) Oral 74 16 95 % 107.956 kg (238 lb)    1. General:  in No Acute distress 2. Psychological: Alert and  Oriented 3. Head/ENT:   Moist   Mucous Membranes                          Head Non traumatic, neck supple                          Normal   Dentition 4. SKIN: normal Skin turgor,  Skin clean Dry and intact no rash 5. Heart: Regular rate and rhythm no Murmur, Rub or gallop 6. Lungs: Clear to auscultation bilaterally, no wheezes or crackles   7. Abdomen: Soft, non-tender, Non distended, obese 8. Lower extremities: no clubbing, cyanosis, or edema 9. Neurologically Grossly intact, moving all 4 extremities equally 10. MSK: Normal range of motion  body mass index is 42.81 kg/(m^2).   Labs on Admission:   Recent Labs  07/22/12 2027  NA 138  K 4.0  CL 104  CO2 26  GLUCOSE 103*  BUN 22  CREATININE 0.95  CALCIUM 9.6   No results found for this basename: AST, ALT, ALKPHOS, BILITOT, PROT, ALBUMIN,  in the last 72 hours No results found for this basename: LIPASE, AMYLASE,  in the last 72 hours  Recent  Labs  07/22/12 2027  WBC 7.3  HGB 14.2  HCT 42.6  MCV 85.9  PLT 173   No results found for this basename: CKTOTAL, CKMB, CKMBINDEX, TROPONINI,  in the last 72 hours No results found for this basename: TSH, T4TOTAL, FREET3, T3FREE, THYROIDAB,  in the last 72 hours No results found for this basename: VITAMINB12, FOLATE, FERRITIN, TIBC, IRON, RETICCTPCT,  in the last 72 hours Lab Results  Component Value Date   HGBA1C 6.5* 11/22/2011    The CrCl is unknown because both a height and weight (above a minimum accepted value) are required for this calculation. ABG    Component Value Date/Time   PHART 7.461* 04/26/2007 1235   HCO3 28.8* 04/26/2007 1236   TCO2 28 05/05/2011 1703   O2SAT 65.0 04/26/2007 1236     Lab Results  Component Value Date   DDIMER  Value: 1.09        AT THE INHOUSE ESTABLISHED CUTOFF VALUE OF 0.48 ug/mL FEU, THIS ASSAY HAS BEEN DOCUMENTED IN THE LITERATURE TO HAVE A SENSITIVITY AND NEGATIVE PREDICTIVE VALUE OF AT LEAST 98 TO 99%.  THE TEST RESULT SHOULD BE CORRELATED WITH AN ASSESSMENT OF THE CLINICAL PROBABILITY OF DVT / VTE.* 02/17/2008     Other results:  I have pearsonaly reviewed this: ECG REPORT  Rate 74  Rhythm: paced   BNP 178  Cultures:    Component Value Date/Time   SDES URINE, CLEAN CATCH 04/23/2009 1146   SPECREQUEST ADDED ON [id number] @1620  04/23/2009 1146   CULT Multiple bacterial morphotypes present, none predominant. Suggest appropriate recollection if clinically indicated. 04/23/2009 1146   REPTSTATUS 04/24/2009 FINAL 04/23/2009 1146       Radiological Exams on Admission: Dg Chest 2 View  07/22/2012   *RADIOLOGY REPORT*  Clinical Data: Chest pain  CHEST - 2 VIEW  Comparison: 11/22/2011  Findings: Cardiac enlargement.  Negative for heart failure.  AICD with multiple  cardiac leads, unchanged.  Negative for pneumonia or effusion.  Lungs are clear.  IMPRESSION: Cardiac enlargement without acute abnormality.   Original Report Authenticated By: Carl Best, M.D.    Chart has been reviewed  Assessment/Plan  67 yo F w hx of CAD and NICM here with Chest pain worrisome for Unstable angina  Present on Admission:  Chest pain worrisome for unstable angina. Neg troponin, admit to step down. Serial ECG, NPO for possible cath in AM. Spoke to [facility] who are aware of the patient and will see in AM. Serial troponins. Per cardiology holding off on anticoagulation given negative cardiac markers. Aspirin given.  [name] HYPERTENSION - continue home medications . CAD, non obstructive by cath 2010 - on aspirin, statin and betablocker . GERD - protonix  . Cardiomyopathy, nonischemic, Bi V responder, Last EF >55% -  repeat ECHO, continue lasix at this point apears euvolemic . ASTHMA, UNSPECIFIED, UNSPECIFIED STATUS - albuterol PRN, stable   Prophylaxis: Lovenox, Protonix  CODE STATUS: FULL CODE  Other plan as per orders.  I have spent a total of 65 min on this admission -time taken to speak to cardiology  [place] 07/22/2012, 11:09 PM

## 2012-07-22 NOTE — ED Notes (Signed)
Presents with left sided chest pain described as crushing pain with radiation to left arm and left shoulder that began 5 days ago associated with nausea, SOB and lightheadedness. Exertion makes pain worse and rest makes pain better. Bilateral lower extremities edematous. HX of Pacemaker/defib placement for irregular heart beat.

## 2012-07-22 NOTE — ED Provider Notes (Signed)
History     CSN: [id number]  Arrival date & time 07/22/12  1851   First MD Initiated Contact with Patient 07/22/12 1954      Chief Complaint  Patient presents with  . Chest Pain    (Consider location/radiation/quality/duration/timing/severity/associated sxs/prior treatment) Patient is a 67 y.o. female presenting with chest pain. The history is provided by the patient.  Chest Pain Pain location:  L chest Pain quality: crushing   Pain radiates to:  L arm Pain radiates to the back: no   Pain severity:  Moderate Onset quality:  Sudden Duration:  2 days Timing:  Constant Progression:  Unchanged Chronicity:  Recurrent Context: at rest   Relieved by:  Nothing Worsened by:  Nothing tried Ineffective treatments:  None tried Associated symptoms: nausea and shortness of breath   Associated symptoms: no abdominal pain, no cough, no dizziness, no fever, no lower extremity edema, no palpitations, not vomiting and no weakness   Risk factors: coronary artery disease (cath in 2010 showing a 40 percent stenosis of the LAD.)     Past Medical History  Diagnosis Date  . Asthma   . Hypertension   . Irregular heart rate   . Arthritis   . CHF (congestive heart failure)   . GERD (gastroesophageal reflux disease)   . Diverticulosis   . Anemia   . CAD (coronary artery disease)   . Fatty liver disease, nonalcoholic   . Internal hemorrhoids   . Gastritis   . Hyperplastic colon polyp   . Sleep apnea   . ICD (implantable cardiac defibrillator) in place   . Shortness of breath   . Pacemaker   . IBS (irritable bowel syndrome)     Past Surgical History  Procedure Laterality Date  . Pacemaker insertion      x 2  . Cardiac defibrillator placement    . Appendectomy    . Knee surgery      right  . Ankle surgery      right  . Tubal ligation    . Cervical spine surgery      plate     Family History  Problem Relation Age of Onset  . Breast cancer Mother   . Diabetes Mother   .  Heart disease Maternal Grandmother   . Kidney disease Maternal Grandmother   . Diabetes Maternal Grandmother   . Glaucoma Maternal Aunt   . Heart disease Maternal Aunt     History  Substance Use Topics  . Smoking status: Former Smoker    Types: Cigarettes  . Smokeless tobacco: Never Used  . Alcohol Use: No    OB History   Grav Para Term Preterm Abortions TAB SAB Ect Mult Living                  Review of Systems  Constitutional: Negative for fever and chills.  Respiratory: Positive for shortness of breath. Negative for cough and chest tightness.   Cardiovascular: Positive for chest pain. Negative for palpitations.  Gastrointestinal: Positive for nausea. Negative for vomiting, abdominal pain, diarrhea and constipation.  Genitourinary: Negative for dysuria.  Neurological: Negative for dizziness and weakness.  All other systems reviewed and are negative.    Allergies  Ivp dye; Shellfish-derived products; Sulfa antibiotics; and Iodine  Home Medications   Current Outpatient Rx  Name  Route  Sig  Dispense  Refill  . albuterol (PROVENTIL HFA;VENTOLIN HFA) 108 (90 BASE) MCG/ACT inhaler   Inhalation   Inhale 2 puffs into the lungs  every 6 (six) hours as needed. For shortness of breath         . amLODipine (NORVASC) 10 MG tablet   Oral   Take 10 mg by mouth daily.         [name] aspirin EC 81 MG tablet   Oral   Take 81 mg by mouth daily.         . budesonide-formoterol (SYMBICORT) 160-4.5 MCG/ACT inhaler   Inhalation   Inhale 2 puffs into the lungs 2 (two) times daily.         . busPIRone (BUSPAR) 5 MG tablet   Oral   Take 5 mg by mouth 2 (two) times daily as needed (for nerves).          . cetirizine (ZYRTEC) 10 MG tablet   Oral   Take 10 mg by mouth daily as needed for allergies.         [name] docusate sodium (COLACE) 100 MG capsule   Oral   Take 100 mg by mouth at bedtime. constipation         . fluticasone (FLONASE) 50 MCG/ACT nasal spray   Nasal    Place 2 sprays into the nose daily as needed for rhinitis or allergies.          . furosemide (LASIX) 40 MG tablet   Oral   Take 40 mg by mouth 2 (two) times daily.           [name] guaifenesin (HUMIBID E) 400 MG TABS   Oral   Take 400 mg by mouth at bedtime as needed (congestion).         [name] losartan (COZAAR) 100 MG tablet   Oral   Take 100 mg by mouth 2 (two) times daily.          . metoprolol succinate (TOPROL-XL) 50 MG 24 hr tablet   Oral   Take 75 mg by mouth every morning.         [name] omeprazole (PRILOSEC) 20 MG capsule   Oral   Take 20 mg by mouth 3 (three) times daily.         [name] oxymetazoline (AFRIN) 0.05 % nasal spray   Nasal   Place 1 spray into the nose at bedtime as needed. For congestion         . potassium chloride SA (K-DUR,KLOR-CON) 20 MEQ tablet   Oral   Take 20 mEq by mouth 2 (two) times daily.           . pravastatin (PRAVACHOL) 40 MG tablet   Oral   Take 40 mg by mouth daily.         [name] spironolactone (ALDACTONE) 25 MG tablet   Oral   Take 12.5 mg by mouth 2 (two) times daily.           . traMADol (ULTRAM) 50 MG tablet   Oral   Take 50 mg by mouth every 6 (six) hours as needed. pain           BP 160/71  Pulse 74  Temp(Src) 98.7 F (37.1 C) (Oral)  Resp 16  Wt 238 lb (107.956 kg)  BMI 42.81 kg/m2  SpO2 95%  Physical Exam  Nursing note and vitals reviewed. Constitutional: She is oriented to person, place, and time. She appears well-developed and well-nourished. No distress.  HENT:  Head: Normocephalic and atraumatic.  Mouth/Throat: Oropharynx is clear and moist.  Eyes: EOM are normal. Pupils are equal, round, and reactive to light.  Neck: Normal range of motion. Neck supple.  Cardiovascular: Normal rate, regular rhythm and normal heart sounds.  Exam reveals no friction rub.   No murmur heard. Pulmonary/Chest: Effort normal and breath sounds normal. No respiratory distress. She has no wheezes. She has no rales.  Abdominal:  Soft. There is no tenderness. There is no rebound and no guarding.  Musculoskeletal: Normal range of motion. She exhibits no edema and no tenderness.  Lymphadenopathy:    She has no cervical adenopathy.  Neurological: She is alert and oriented to person, place, and time.  Skin: Skin is warm and dry. No rash noted.  Psychiatric: She has a normal mood and affect. Her behavior is normal.    ED Course  Procedures (including critical care time)  Labs Reviewed  BASIC METABOLIC PANEL - Abnormal; Notable for the following:    Glucose, Bld 103 (*)    GFR calc non Af Amer 60 (*)    GFR calc Af Amer 70 (*)    All other components within normal limits  PRO B NATRIURETIC PEPTIDE - Abnormal; Notable for the following:    Pro B Natriuretic peptide (BNP) 178.9 (*)    All other components within normal limits  CBC  POCT I-STAT TROPONIN I   Dg Chest 2 View  07/22/2012   *RADIOLOGY REPORT*  Clinical Data: Chest pain  CHEST - 2 VIEW  Comparison: 11/22/2011  Findings: Cardiac enlargement.  Negative for heart failure.  AICD with multiple cardiac leads, unchanged.  Negative for pneumonia or effusion.  Lungs are clear.  IMPRESSION: Cardiac enlargement without acute abnormality.   Original Report Authenticated By: Carl Best, M.D.     Date: 07/23/2012  Rate: 74  Rhythm: paced rhythm  QRS Axis: normal  Intervals: normal  ST/T Wave abnormalities: normal  Conduction Disutrbances:none  Narrative Interpretation:   Old EKG Reviewed: none available and unchanged    1. Coronary atherosclerosis of unspecified type of vessel, native or graft   2. Unspecified essential hypertension   3. Unstable angina       MDM  42:78 PM 67 year old female with history of hypertension, CHF with EF 55%, CAD with last cath in 2010 showing 40% stenosis the LAD presenting with 2 days of left-sided chest pressure radiating to her left arm with shortness of breath and nausea. She denies diaphoresis. She does have a history of  pacemaker placement, and states her symptoms are similar to the time that she had a pacemaker placed. Vital signs are stable. Initial troponin is negative. EKG is not showing signs of ischemia. Given known history of CAD with crushing chest pain medicine consulted and patient for further management.        Bonnetta Barry, MD 07/23/12 [phone]

## 2012-07-23 ENCOUNTER — Encounter (HOSPITAL_COMMUNITY): Payer: Self-pay

## 2012-07-23 ENCOUNTER — Encounter (HOSPITAL_COMMUNITY)
Admission: EM | Disposition: A | Discharge status: Home / Self Care | Payer: Self-pay | Source: Home / Self Care | Attending: Cardiology

## 2012-07-23 DIAGNOSIS — Z91041 Radiographic dye allergy status: Secondary | ICD-10-CM

## 2012-07-23 DIAGNOSIS — I428 Other cardiomyopathies: Secondary | ICD-10-CM

## 2012-07-23 DIAGNOSIS — I517 Cardiomegaly: Secondary | ICD-10-CM

## 2012-07-23 DIAGNOSIS — I2510 Atherosclerotic heart disease of native coronary artery without angina pectoris: Principal | ICD-10-CM

## 2012-07-23 DIAGNOSIS — I200 Unstable angina: Secondary | ICD-10-CM

## 2012-07-23 HISTORY — PX: CARDIAC CATHETERIZATION: SHX172

## 2012-07-23 HISTORY — PX: TRANSTHORACIC ECHOCARDIOGRAM: SHX275

## 2012-07-23 HISTORY — PX: LEFT HEART CATHETERIZATION WITH CORONARY ANGIOGRAM: SHX5451

## 2012-07-23 LAB — BASIC METABOLIC PANEL
BUN: 18 mg/dL (ref 6–23)
CO2: 29 mEq/L (ref 19–32)
Calcium: 9 mg/dL (ref 8.4–10.5)
Chloride: 105 mEq/L (ref 96–112)
Creatinine, Ser: 1 mg/dL (ref 0.50–1.10)
GFR calc Af Amer: 66 mL/min — ABNORMAL LOW (ref >90)
GFR calc non Af Amer: 57 mL/min — ABNORMAL LOW (ref >90)
Glucose, Bld: 101 mg/dL — ABNORMAL HIGH (ref 70–99)
Potassium: 3.7 mEq/L (ref 3.5–5.1)
Sodium: 140 mEq/L (ref 135–145)

## 2012-07-23 LAB — CBC
HCT: 43.8 % (ref 36.0–46.0)
HCT: 42.4 % (ref 36.0–46.0)
Hemoglobin: 14.2 g/dL (ref 12.0–15.0)
Hemoglobin: 14 g/dL (ref 12.0–15.0)
MCH: 28.2 pg (ref 26.0–34.0)
MCH: 28.6 pg (ref 26.0–34.0)
MCHC: 32.4 g/dL (ref 30.0–36.0)
MCHC: 33 g/dL (ref 30.0–36.0)
MCV: 86.9 fL (ref 78.0–100.0)
MCV: 86.7 fL (ref 78.0–100.0)
Platelets: 164 10*3/uL (ref 150–400)
Platelets: 144 10*3/uL — ABNORMAL LOW (ref 150–400)
RBC: 5.04 MIL/uL (ref 3.87–5.11)
RBC: 4.89 MIL/uL (ref 3.87–5.11)
RDW: 14.5 % (ref 11.5–15.5)
RDW: 14.3 % (ref 11.5–15.5)
WBC: 6.4 10*3/uL (ref 4.0–10.5)
WBC: 5.5 10*3/uL (ref 4.0–10.5)

## 2012-07-23 LAB — LIPID PANEL
Cholesterol: 265 mg/dL — ABNORMAL HIGH (ref 0–200)
HDL: 50 mg/dL (ref >39)
LDL Cholesterol: 181 mg/dL — ABNORMAL HIGH (ref 0–99)
Total CHOL/HDL Ratio: 5.3 RATIO
Triglycerides: 169 mg/dL — ABNORMAL HIGH (ref <150)
VLDL: 34 mg/dL (ref 0–40)

## 2012-07-23 LAB — MRSA PCR SCREENING: MRSA by PCR: NEGATIVE

## 2012-07-23 LAB — TROPONIN I
Troponin I: <0.3 ng/mL (ref <0.30)
Troponin I: <0.3 ng/mL (ref <0.30)
Troponin I: <0.3 ng/mL (ref <0.30)

## 2012-07-23 LAB — APTT: aPTT: 33 seconds (ref 24–37)

## 2012-07-23 LAB — CREATININE, SERUM
Creatinine, Ser: 0.97 mg/dL (ref 0.50–1.10)
GFR calc Af Amer: 68 mL/min — ABNORMAL LOW (ref >90)
GFR calc non Af Amer: 59 mL/min — ABNORMAL LOW (ref >90)

## 2012-07-23 LAB — PROTIME-INR
INR: 0.91 (ref 0.00–1.49)
INR: 0.94 (ref 0.00–1.49)
Prothrombin Time: 12.2 seconds (ref 11.6–15.2)
Prothrombin Time: 12.5 seconds (ref 11.6–15.2)

## 2012-07-23 LAB — HEMOGLOBIN A1C
Hgb A1c MFr Bld: 5.9 % — ABNORMAL HIGH (ref <5.7)
Mean Plasma Glucose: 123 mg/dL — ABNORMAL HIGH (ref <117)

## 2012-07-23 LAB — TSH: TSH: 5.217 u[IU]/mL — ABNORMAL HIGH (ref 0.350–4.500)

## 2012-07-23 SURGERY — LEFT HEART CATHETERIZATION WITH CORONARY ANGIOGRAM
Anesthesia: LOCAL

## 2012-07-23 MED ORDER — ALBUTEROL SULFATE HFA 108 (90 BASE) MCG/ACT IN AERS
2.0000 | INHALATION_SPRAY | Freq: Four times a day (QID) | RESPIRATORY_TRACT | Status: DC | PRN
  Administered 2012-07-23 (×2): 2 via RESPIRATORY_TRACT
  Filled 2012-07-23: qty 6.7

## 2012-07-23 MED ORDER — ONDANSETRON HCL 4 MG/2ML IJ SOLN: 4.0000 mg | Freq: Four times a day (QID) | INTRAMUSCULAR | Status: DC | PRN

## 2012-07-23 MED ORDER — DIAZEPAM 5 MG PO TABS
5.0000 mg | ORAL_TABLET | ORAL | Status: AC
  Administered 2012-07-23: 5 mg via ORAL
  Filled 2012-07-23: qty 1

## 2012-07-23 MED ORDER — DOCUSATE SODIUM 100 MG PO CAPS
100.0000 mg | ORAL_CAPSULE | Freq: Every day | ORAL | Status: DC
  Administered 2012-07-23: 100 mg via ORAL
  Filled 2012-07-23 (×2): qty 1

## 2012-07-23 MED ORDER — ASPIRIN 81 MG PO CHEW: 324.0000 mg | CHEWABLE_TABLET | ORAL | Status: AC

## 2012-07-23 MED ORDER — NITROGLYCERIN 0.4 MG SL SUBL
0.4000 mg | SUBLINGUAL_TABLET | SUBLINGUAL | Status: DC | PRN
  Administered 2012-07-23: 0.4 mg via SUBLINGUAL
  Filled 2012-07-23: qty 25

## 2012-07-23 MED ORDER — FUROSEMIDE 40 MG PO TABS: 40.0000 mg | ORAL_TABLET | Freq: Two times a day (BID) | ORAL | Status: DC

## 2012-07-23 MED ORDER — ACETAMINOPHEN 325 MG PO TABS
650.0000 mg | ORAL_TABLET | ORAL | Status: DC | PRN
  Administered 2012-07-23: 650 mg via ORAL
  Filled 2012-07-23: qty 2

## 2012-07-23 MED ORDER — LIDOCAINE HCL (PF) 1 % IJ SOLN
INTRAMUSCULAR | Status: AC
  Filled 2012-07-23: qty 30

## 2012-07-23 MED ORDER — ASPIRIN 81 MG PO CHEW
CHEWABLE_TABLET | ORAL | Status: AC
  Administered 2012-07-23: 324 mg via ORAL
  Filled 2012-07-23: qty 4

## 2012-07-23 MED ORDER — SODIUM CHLORIDE 0.9 % IV SOLN: INTRAVENOUS | Status: DC

## 2012-07-23 MED ORDER — LOSARTAN POTASSIUM 50 MG PO TABS: 100.0000 mg | ORAL_TABLET | Freq: Two times a day (BID) | ORAL | Status: DC

## 2012-07-23 MED ORDER — METOPROLOL SUCCINATE ER 100 MG PO TB24
100.0000 mg | ORAL_TABLET | Freq: Every day | ORAL | Status: DC
  Administered 2012-07-24: 100 mg via ORAL
  Filled 2012-07-23: qty 1

## 2012-07-23 MED ORDER — SPIRONOLACTONE 12.5 MG HALF TABLET: 12.5000 mg | ORAL_TABLET | Freq: Two times a day (BID) | ORAL | Status: DC

## 2012-07-23 MED ORDER — LOSARTAN POTASSIUM 50 MG PO TABS
100.0000 mg | ORAL_TABLET | Freq: Two times a day (BID) | ORAL | Status: DC
  Administered 2012-07-23 – 2012-07-24 (×3): 100 mg via ORAL
  Filled 2012-07-23 (×5): qty 2

## 2012-07-23 MED ORDER — SODIUM CHLORIDE 0.9 % IV SOLN: 250.0000 mL | INTRAVENOUS | Status: DC | PRN

## 2012-07-23 MED ORDER — MIDAZOLAM HCL 2 MG/2ML IJ SOLN
INTRAMUSCULAR | Status: AC
  Filled 2012-07-23: qty 2

## 2012-07-23 MED ORDER — SIMVASTATIN 20 MG PO TABS
20.0000 mg | ORAL_TABLET | Freq: Every day | ORAL | Status: DC
  Administered 2012-07-23: 20 mg via ORAL
  Filled 2012-07-23 (×2): qty 1

## 2012-07-23 MED ORDER — METOPROLOL SUCCINATE ER 50 MG PO TB24
75.0000 mg | ORAL_TABLET | Freq: Every morning | ORAL | Status: DC
  Administered 2012-07-23: 75 mg via ORAL
  Filled 2012-07-23: qty 1

## 2012-07-23 MED ORDER — ALPRAZOLAM 0.25 MG PO TABS: 0.2500 mg | ORAL_TABLET | Freq: Two times a day (BID) | ORAL | Status: DC | PRN

## 2012-07-23 MED ORDER — VERAPAMIL HCL 2.5 MG/ML IV SOLN
INTRAVENOUS | Status: AC
  Filled 2012-07-23: qty 2

## 2012-07-23 MED ORDER — SODIUM CHLORIDE 0.9 % IJ SOLN: 3.0000 mL | INTRAMUSCULAR | Status: DC | PRN

## 2012-07-23 MED ORDER — ENOXAPARIN SODIUM 40 MG/0.4ML ~~LOC~~ SOLN
40.0000 mg | SUBCUTANEOUS | Status: DC
  Filled 2012-07-23: qty 0.4

## 2012-07-23 MED ORDER — PANTOPRAZOLE SODIUM 40 MG PO TBEC: 40.0000 mg | DELAYED_RELEASE_TABLET | Freq: Every day | ORAL | Status: DC

## 2012-07-23 MED ORDER — METHYLPREDNISOLONE SODIUM SUCC 125 MG IJ SOLR
125.0000 mg | INTRAMUSCULAR | Status: AC
  Administered 2012-07-23: 125 mg via INTRAVENOUS
  Filled 2012-07-23 (×2): qty 2

## 2012-07-23 MED ORDER — SODIUM CHLORIDE 0.9 % IV SOLN
INTRAVENOUS | Status: DC
  Administered 2012-07-23: 13:00:00 via INTRAVENOUS

## 2012-07-23 MED ORDER — NITROGLYCERIN 0.2 MG/ML ON CALL CATH LAB
INTRAVENOUS | Status: AC
  Filled 2012-07-23: qty 1

## 2012-07-23 MED ORDER — LORATADINE 10 MG PO TABS
10.0000 mg | ORAL_TABLET | Freq: Every day | ORAL | Status: DC
  Administered 2012-07-24: 10 mg via ORAL
  Filled 2012-07-23 (×2): qty 1

## 2012-07-23 MED ORDER — HEPARIN (PORCINE) IN NACL 2-0.9 UNIT/ML-% IJ SOLN
INTRAMUSCULAR | Status: AC
  Filled 2012-07-23: qty 1000

## 2012-07-23 MED ORDER — NITROGLYCERIN IN D5W 200-5 MCG/ML-% IV SOLN
5.0000 ug/min | INTRAVENOUS | Status: DC
  Filled 2012-07-23: qty 250

## 2012-07-23 MED ORDER — FENTANYL CITRATE 0.05 MG/ML IJ SOLN
INTRAMUSCULAR | Status: AC
  Filled 2012-07-23: qty 2

## 2012-07-23 MED ORDER — BUDESONIDE-FORMOTEROL FUMARATE 160-4.5 MCG/ACT IN AERO
2.0000 | INHALATION_SPRAY | Freq: Two times a day (BID) | RESPIRATORY_TRACT | Status: DC
  Administered 2012-07-23 – 2012-07-24 (×3): 2 via RESPIRATORY_TRACT
  Filled 2012-07-23: qty 6

## 2012-07-23 MED ORDER — ACETAMINOPHEN 325 MG PO TABS: 650.0000 mg | ORAL_TABLET | ORAL | Status: DC | PRN

## 2012-07-23 MED ORDER — SODIUM CHLORIDE 0.9 % IJ SOLN
3.0000 mL | Freq: Two times a day (BID) | INTRAMUSCULAR | Status: DC
  Administered 2012-07-23: 3 mL via INTRAVENOUS
  Administered 2012-07-23: 09:00:00 via INTRAVENOUS

## 2012-07-23 MED ORDER — FUROSEMIDE 40 MG PO TABS
40.0000 mg | ORAL_TABLET | Freq: Two times a day (BID) | ORAL | Status: DC
  Filled 2012-07-23 (×2): qty 1

## 2012-07-23 MED ORDER — METOPROLOL SUCCINATE ER 50 MG PO TB24: 75.0000 mg | ORAL_TABLET | Freq: Every morning | ORAL | Status: DC

## 2012-07-23 MED ORDER — SPIRONOLACTONE 12.5 MG HALF TABLET
12.5000 mg | ORAL_TABLET | Freq: Two times a day (BID) | ORAL | Status: DC
  Administered 2012-07-23 – 2012-07-24 (×2): 12.5 mg via ORAL
  Filled 2012-07-23 (×5): qty 1

## 2012-07-23 MED ORDER — ASPIRIN 81 MG PO CHEW: 324.0000 mg | CHEWABLE_TABLET | ORAL | Status: DC

## 2012-07-23 MED ORDER — PANTOPRAZOLE SODIUM 40 MG PO TBEC
40.0000 mg | DELAYED_RELEASE_TABLET | Freq: Every day | ORAL | Status: DC
  Administered 2012-07-23 – 2012-07-24 (×2): 40 mg via ORAL
  Filled 2012-07-23 (×2): qty 1

## 2012-07-23 MED ORDER — BUSPIRONE HCL 5 MG PO TABS
5.0000 mg | ORAL_TABLET | Freq: Two times a day (BID) | ORAL | Status: DC | PRN
  Filled 2012-07-23: qty 1

## 2012-07-23 MED ORDER — DIPHENHYDRAMINE HCL 50 MG/ML IJ SOLN
25.0000 mg | INTRAMUSCULAR | Status: AC
  Administered 2012-07-23: 25 mg via INTRAVENOUS
  Filled 2012-07-23: qty 1

## 2012-07-23 MED ORDER — ASPIRIN EC 81 MG PO TBEC
81.0000 mg | DELAYED_RELEASE_TABLET | Freq: Every day | ORAL | Status: DC
  Administered 2012-07-24: 81 mg via ORAL
  Filled 2012-07-23 (×2): qty 1

## 2012-07-23 MED ORDER — TRAMADOL HCL 50 MG PO TABS
50.0000 mg | ORAL_TABLET | Freq: Four times a day (QID) | ORAL | Status: DC | PRN
  Filled 2012-07-23: qty 1

## 2012-07-23 MED ORDER — ASPIRIN EC 81 MG PO TBEC: 81.0000 mg | DELAYED_RELEASE_TABLET | Freq: Every day | ORAL | Status: DC

## 2012-07-23 MED ORDER — AMLODIPINE BESYLATE 5 MG PO TABS
5.0000 mg | ORAL_TABLET | Freq: Every day | ORAL | Status: DC
  Administered 2012-07-23: 5 mg via ORAL
  Filled 2012-07-23 (×2): qty 1

## 2012-07-23 MED ORDER — FAMOTIDINE 20 MG PO TABS
20.0000 mg | ORAL_TABLET | ORAL | Status: DC
  Filled 2012-07-23: qty 1

## 2012-07-23 NOTE — H&P (Deleted)
Patient ID: VANEZZA CORR MRN: [id number], DOB/AGE: 06-23-1945   Admit date: 07/22/2012   Primary Physician: [name], MD Primary Cardiologist: Dr Rollene Fare  HPI: 67 y/o morbidly obese female followed by Dr Rollene Fare with a history of non obstructive CAD by cath 2010 (40% LAD). She has a history of PTVDP in 2001 with an upgrade to a BiV in 2008 for NICM (EF 15-20% then). She is a responder, EF >55% in Oct 2013. She just saw Dr Rollene Fare in the office in April. She apparently is nearing end of life on her BiV. Dr Rollene Fare added Norvasc for HTN when he saw her.         She was admitted last night after she presented to the ER with complaints of intermittent, exertional mid sternal chest pressure. She says it radiated to her Lt shoulder and is associated with SOB. Her Troponin is negative X 2. She continues to complain of some pressure this am.    Problem List: Past Medical History  Diagnosis Date  . Asthma   . Hypertension   . Irregular heart rate   . Arthritis   . GERD (gastroesophageal reflux disease)   . Diverticulosis   . Anemia   . CAD (coronary artery disease)   . Fatty liver disease, nonalcoholic   . Internal hemorrhoids   . Gastritis   . Hyperplastic colon polyp   . Sleep apnea   . ICD (implantable cardiac defibrillator) in place   . Shortness of breath   . Pacemaker   . IBS (irritable bowel syndrome)   . Automatic implantable cardioverter-defibrillator in situ   . Anxiety   . CHF (congestive heart failure)   . H/O hiatal hernia     Past Surgical History  Procedure Laterality Date  . Pacemaker insertion  '01, '08    BS BivICD  . Cardiac defibrillator placement    . Appendectomy    . Knee surgery      right  . Ankle surgery      right  . Tubal ligation    . Cervical spine surgery      plate   . Coronary angiogram  2010     Allergies:  Allergies  Allergen Reactions  . Ivp Dye (Iodinated Diagnostic Agents) Shortness Of Breath  . Shellfish-Derived  Products Anaphylaxis  . Sulfa Antibiotics Shortness Of Breath  . Iodine Hives     Home Medications Current Facility-Administered Medications  Medication Dose Route Frequency Provider Last Rate Last Dose  . 0.9 %  sodium chloride infusion  250 mL Intravenous PRN Toy Baker, MD      . acetaminophen (TYLENOL) tablet 650 mg  650 mg Oral Q4H PRN Toy Baker, MD      . albuterol (PROVENTIL HFA;VENTOLIN HFA) 108 (90 BASE) MCG/ACT inhaler 2 puff  2 puff Inhalation Q6H PRN Toy Baker, MD   2 puff at 07/23/12 0453  . ALPRAZolam (XANAX) tablet 0.25 mg  0.25 mg Oral BID PRN Toy Baker, MD      . aspirin EC tablet 81 mg  81 mg Oral Daily Toy Baker, MD      . budesonide-formoterol (SYMBICORT) 160-4.5 MCG/ACT inhaler 2 puff  2 puff Inhalation BID Toy Baker, MD      . busPIRone (BUSPAR) tablet 5 mg  5 mg Oral BID PRN Toy Baker, MD      . docusate sodium (COLACE) capsule 100 mg  100 mg Oral QHS Toy Baker, MD      . enoxaparin (LOVENOX)  injection 40 mg  40 mg Subcutaneous Q24H Toy Baker, MD      . furosemide (LASIX) tablet 40 mg  40 mg Oral BID Debbe Odea, MD      . loratadine (CLARITIN) tablet 10 mg  10 mg Oral Daily Toy Baker, MD      . losartan (COZAAR) tablet 100 mg  100 mg Oral BID Debbe Odea, MD   100 mg at 07/23/12 0337  . metoprolol succinate (TOPROL-XL) 24 hr tablet 75 mg  75 mg Oral q morning - 10a Debbe Odea, MD   75 mg at 07/23/12 0336  . nitroGLYCERIN (NITROSTAT) SL tablet 0.4 mg  0.4 mg Sublingual Q5 Min x 3 PRN Toy Baker, MD      . ondansetron (ZOFRAN) injection 4 mg  4 mg Intravenous Q6H PRN Toy Baker, MD      . pantoprazole (PROTONIX) EC tablet 40 mg  40 mg Oral Daily Toy Baker, MD      . simvastatin (ZOCOR) tablet 20 mg  20 mg Oral q1800 Anastassia Doutova, MD      . sodium chloride 0.9 % injection 3 mL  3 mL Intravenous Q12H Anastassia Doutova, MD      . sodium chloride  0.9 % injection 3 mL  3 mL Intravenous PRN Toy Baker, MD      . spironolactone (ALDACTONE) tablet 12.5 mg  12.5 mg Oral BID Debbe Odea, MD      . traMADol (ULTRAM) tablet 50 mg  50 mg Oral Q6H PRN Toy Baker, MD         Family History  Problem Relation Age of Onset  . Breast cancer Mother   . Diabetes Mother   . Heart disease Maternal Grandmother   . Kidney disease Maternal Grandmother   . Diabetes Maternal Grandmother   . Glaucoma Maternal Aunt   . Heart disease Maternal Aunt      History   Social History  . Marital Status: Married    Spouse Name: N/A    Number of Children: 47  . Years of Education: N/A   Occupational History  . 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 Smoking status: Former Smoker    Types: Cigarettes  . Smokeless tobacco: Never Used  . Alcohol Use: No  . Drug Use: No  . Sexually Active: No   Other Topics Concern  . Not on file   Social History Narrative   Widowed last year. She lives with her two sons.     Review of Systems: General: negative for chills, fever, night sweats or weight changes.  Dermatological: negative for rash Respiratory: negative for cough or wheezing Urologic: negative for hematuria Abdominal: negative for nausea, vomiting, diarrhea, bright red blood per rectum, melena, or hematemesis Neurologic: negative for visual changes, syncope, or dizziness All other systems reviewed and are otherwise negative except as noted above.  Physical Exam: Blood pressure 166/73, pulse 69, temperature 98.3 F (36.8 C), temperature source Oral, resp. rate 13, height 5\' 5"  (1.651 m), weight 111.5 kg (245 lb 13 oz), SpO2 94.00%.  General appearance: alert, cooperative, no distress and morbidly obese Neck: no carotid bruit and no JVD Lungs: clear to auscultation bilaterally Heart: regular rate and rhythm Abdomen: morbid obesity Extremities: no edema Pulses: 1-2+ distal pulses Skin:  pale Neurologic: Grossly normal    Labs:   Results for orders placed during the hospital encounter of 07/22/12 (from the past 24 hour(s))  CBC  Status: None   Collection Time    07/22/12  8:27 PM      Result Value Range   WBC 7.3  4.0 - 10.5 K/uL   RBC 4.96  3.87 - 5.11 MIL/uL   Hemoglobin 14.2  12.0 - 15.0 g/dL   HCT 42.6  36.0 - 46.0 %   MCV 85.9  78.0 - 100.0 fL   MCH 28.6  26.0 - 34.0 pg   MCHC 33.3  30.0 - 36.0 g/dL   RDW 14.3  11.5 - 15.5 %   Platelets 173  150 - 400 K/uL  BASIC METABOLIC PANEL     Status: Abnormal   Collection Time    07/22/12  8:27 PM      Result Value Range   Sodium 138  135 - 145 mEq/L   Potassium 4.0  3.5 - 5.1 mEq/L   Chloride 104  96 - 112 mEq/L   CO2 26  19 - 32 mEq/L   Glucose, Bld 103 (*) 70 - 99 mg/dL   BUN 22  6 - 23 mg/dL   Creatinine, Ser 0.95  0.50 - 1.10 mg/dL   Calcium 9.6  8.4 - 10.5 mg/dL   GFR calc non Af Amer 60 (*) >90 mL/min   GFR calc Af Amer 70 (*) >90 mL/min  PRO B NATRIURETIC PEPTIDE     Status: Abnormal   Collection Time    07/22/12  8:27 PM      Result Value Range   Pro B Natriuretic peptide (BNP) 178.9 (*) 0 - 125 pg/mL  POCT I-STAT TROPONIN I     Status: None   Collection Time    07/22/12  8:47 PM      Result Value Range   Troponin i, poc 0.00  0.00 - 0.08 ng/mL   Comment 3           MRSA PCR SCREENING     Status: None   Collection Time    07/23/12  1:24 AM      Result Value Range   MRSA by PCR NEGATIVE  NEGATIVE  PROTIME-INR     Status: None   Collection Time    07/23/12  2:04 AM      Result Value Range   Prothrombin Time 12.2  11.6 - 15.2 seconds   INR 0.91  0.00 - 1.49  APTT     Status: None   Collection Time    07/23/12  2:04 AM      Result Value Range   aPTT 33  24 - 37 seconds  TROPONIN I     Status: None   Collection Time    07/23/12  2:04 AM      Result Value Range   Troponin I <0.30  <0.30 ng/mL  CBC     Status: None   Collection Time    07/23/12  2:04 AM      Result Value Range    WBC 6.4  4.0 - 10.5 K/uL   RBC 5.04  3.87 - 5.11 MIL/uL   Hemoglobin 14.2  12.0 - 15.0 g/dL   HCT 43.8  36.0 - 46.0 %   MCV 86.9  78.0 - 100.0 fL   MCH 28.2  26.0 - 34.0 pg   MCHC 32.4  30.0 - 36.0 g/dL   RDW 14.5  11.5 - 15.5 %   Platelets 164  150 - 400 K/uL  CREATININE, SERUM     Status: Abnormal   Collection Time  07/23/12  2:04 AM      Result Value Range   Creatinine, Ser 0.97  0.50 - 1.10 mg/dL   GFR calc non Af Amer 59 (*) >90 mL/min   GFR calc Af Amer 68 (*) >90 mL/min  CBC     Status: Abnormal   Collection Time    07/23/12  6:45 AM      Result Value Range   WBC 5.5  4.0 - 10.5 K/uL   RBC 4.89  3.87 - 5.11 MIL/uL   Hemoglobin 14.0  12.0 - 15.0 g/dL   HCT 42.4  36.0 - 46.0 %   MCV 86.7  78.0 - 100.0 fL   MCH 28.6  26.0 - 34.0 pg   MCHC 33.0  30.0 - 36.0 g/dL   RDW 14.3  11.5 - 15.5 %   Platelets 144 (*) 150 - 400 K/uL  BASIC METABOLIC PANEL     Status: Abnormal   Collection Time    07/23/12  6:45 AM      Result Value Range   Sodium 140  135 - 145 mEq/L   Potassium 3.7  3.5 - 5.1 mEq/L   Chloride 105  96 - 112 mEq/L   CO2 29  19 - 32 mEq/L   Glucose, Bld 101 (*) 70 - 99 mg/dL   BUN 18  6 - 23 mg/dL   Creatinine, Ser 1.00  0.50 - 1.10 mg/dL   Calcium 9.0  8.4 - 10.5 mg/dL   GFR calc non Af Amer 57 (*) >90 mL/min   GFR calc Af Amer 66 (*) >90 mL/min  PROTIME-INR     Status: None   Collection Time    07/23/12  6:45 AM      Result Value Range   Prothrombin Time 12.5  11.6 - 15.2 seconds   INR 0.94  0.00 - 1.49  TROPONIN I     Status: None   Collection Time    07/23/12  6:45 AM      Result Value Range   Troponin I <0.30  <0.30 ng/mL  LIPID PANEL     Status: Abnormal   Collection Time    07/23/12  6:45 AM      Result Value Range   Cholesterol 265 (*) 0 - 200 mg/dL   Triglycerides 169 (*) <150 mg/dL   HDL 50  >39 mg/dL   Total CHOL/HDL Ratio 5.3     VLDL 34  0 - 40 mg/dL   LDL Cholesterol 181 (*) 0 - 99 mg/dL     Radiology/Studies: Dg Chest 2  View  07/22/2012   *RADIOLOGY REPORT*  Clinical Data: Chest pain  CHEST - 2 VIEW  Comparison: 11/22/2011  Findings: Cardiac enlargement.  Negative for heart failure.  AICD with multiple cardiac leads, unchanged.  Negative for pneumonia or effusion.  Lungs are clear.  IMPRESSION: Cardiac enlargement without acute abnormality.   Original Report Authenticated By: Carl Best, M.D.    [id number]  ASSESSMENT AND PLAN:  Principal Problem:   Unstable angina Active Problems:   CAD, 40% LAD 2010   PTVDP 2001, upgraded to BS BiV device with good response 2008   Cardiomyopathy, nonischemic, Bi V responder,  EF >55% by 2D Oct 2013   Allergic to IV contrast   HYPERTENSION   Morbid obesity   COPD (chronic obstructive pulmonary disease), asthmatic component   GERD (gastroesophageal reflux disease)   Sleep apnea, C-pap intol   ASTHMA, UNSPECIFIED, UNSPECIFIED STATUS   PLAN: Discussed with  Dr Ellyn Hack. Proceed with diagnostic cath. She will need pre- cath contrast prophylaxis.   Henri Medal, PA-C 07/23/2012, 8:19 AM

## 2012-07-23 NOTE — ED Provider Notes (Signed)
I saw and evaluated the patient, reviewed the resident's note and I agree with the findings and plan. Agree with EKG interpretation if present.   Pt with history of NICM and non-obstructive CAD last had cath in 2010, reports moderate to severe chest pain off and on for 3 days worse with exertion. Plan admission for rule out and further eval.   Charles B. Karle Starch, MD 07/23/12 [id number]

## 2012-07-23 NOTE — Progress Notes (Signed)
  Echocardiogram 2D Echocardiogram has been performed.  Evelette Hollern FRANCES 07/23/2012, 5:38 PM

## 2012-07-23 NOTE — CV Procedure (Signed)
[name] is a 67 y.o. female    [id number]  [id number] LOCATION:  FACILITY: [facility]  PHYSICIAN: Troy Sine, MD, [facility] 08/20/1945   DATE OF PROCEDURE:  07/23/2012    [facility]  CARDIAC CATHETERIZATION     HISTORY:  [name] is a 67 year old female patient of Dr. Rollene Fare who has a history of morbid obesity, and known coronary artery disease by catheterization previously with 40% LAD stenosis. She is status post initial pacemaker insertion in 2001 and due to a nonischemic myopathy with an ejection fraction of 15-20% in 2008 she underwent IV ICD upgrade. She is responder to biventricular pacing in October 2013 her ejection fraction was approximately 55%. She is near end of generator life and will soon require a generator change. She has a history of hypertension, arthritis, GERD, gastritis, asthma, and prior shellfish allergy. She was admitted yesterday with exertional midsternal chest pressure radiating to her left shoulder. Initial troponins were negative. She was seen earlier this morning by Dr. Ellyn Hack and is now scheduled for cardiac catheterization.   PROCEDURE:  The patient was brought to the second floor  Cardiac cath lab in the postabsorptive state. She was premedicated with 2 mg of Versed and 25 mcg of fentanyl for initial conscious sedation. Due to her morbid obesity, initial attempt at right radial access was performed by me with Dr. Ellyn Hack as my proctor. A modified Allen's test confirmed intact ulnar collateral flow.  The right radial area was prepped  in sterile fashion.  Xylocaine 1% was used for local anesthesia. Access was obtained utilizing the 5 [place] Glidesheath Slender radial kit with a 22 gauge angiocath and micropuncture wire. The wire advanced easily initially but then met  was resistance. The wire was pulled back and the sheath was inserted.  Flouroscopy was utilized for wire advancement.  After another attempt at  advancement similar resistance was met and  a versicor wire was then utilized which again met resistance at the similar site. A scout injection with contrast did reveal extraluminal dye extravasation, and the decision was to abort the radial approach. The patient had excellent pulses and stable hemodynamics.  A radial TR band was applied at 10:12 at 14 cc compression with excellent hemostasis. The right femoral region was then prepped and draped in sterile fashion. A 5 French sheath was inserted into the femoral artery. Diagnostic catheterizatiion was done with 5 [place] LF4, FR4, and pigtail catheters. Left ventriculography was done with 25 cc Omnipaque contrast. Hemostasis was obtained by direct manual compression. The patient tolerated the procedure well.   HEMODYNAMICS:   AO SYSTOLIC/AO DIASTOLIC: 99991111   LV SYSTOLIC/LV DIASTOLIC: [id number]  ANGIOGRAPHY:  The left main coronary artery had ostial calcification but otherwise was free of obstructive disease and bifurcated into the LAD and left circumflex coronary artery.  The left anterior descending artery gave rise to a proximal diagonal vessel which had 30% ostial narrowing after 2 septal perforating arteries and the second diagonal vessel. The midportion of the LAD had 40-50% tubular narrowing during diastole. However, with systole, there was midsystolic bridging with narrowing up to 80% during systole. There also was mild [id number] systolic bridging in the midportion of the second diagonal vessel. The remainder of the LAD was free of significant disease and extended to and wrapped around the LAD.  The left circumflex coronary artery gave rise to a proximal marginal vessel. After a superior branch arising from this marginal vessel there was 20% narrowing in  this OM1 vessel. The remainder of the circumflex was free of significant disease and supplied the distal marginal and PLA -like vessel.  The right coronary artery had some 20-30% narrowing in its  midsegment. There was 20% narrowing in the mid PDA. The distal circumflex were normal.     IMPRESSION:    Normal LV function with EF 50 - 55% Calcification of LM ostium without stenosis LAD with 30% Dx1 ostial stenosis, 40 - 50% mid LAD stenosis with systolic bridging to 123XX123 focally and 30% in mid Dx2. RCA with 20 - 30% narrowing.  RECOMMENDATION:  Medical therapy.    Troy Sine, MD, [facility] 07/23/2012 11:24 AM

## 2012-07-23 NOTE — Consult Note (Addendum)
Reason for Consult: Chest pain  Requesting Physician: Mary Sella  HPI:  67 y/o morbidly obese female followed by Dr Rollene Fare with a history of non obstructive CAD by cath 2010 (40% LAD). She has a history of PTVDP in 2001 with an upgrade to a BiV in 2008 for NICM (EF 15-20% then). She is a responder, EF >55% in Oct 2013. She just saw Dr Rollene Fare in the office in April. She apparently is nearing end of life on her BiV. Dr Rollene Fare added Norvasc for HTN when he saw her.         She was admitted last night after she presented to the ER with complaints of intermittent, exertional mid sternal chest pressure. She says it radiated to her Lt shoulder and is associated with SOB. Her Troponin is negative X 2. She continues to complain of some pressure this am.  PMHx:  Past Medical History  Diagnosis Date  . Asthma   . Hypertension   . Arthritis   . GERD (gastroesophageal reflux disease)   . Diverticulosis   . Anemia   . CAD (coronary artery disease)   . Fatty liver disease, nonalcoholic   . Internal hemorrhoids   . Gastritis   . Hyperplastic colon polyp   . Sleep apnea   . ICD (implantable cardiac defibrillator) in place   . Shortness of breath   . IBS (irritable bowel syndrome)   . Anxiety   . CHF (congestive heart failure)   . H/O hiatal hernia   . Obesity    Past Surgical History  Procedure Laterality Date  . Pacemaker insertion  '01, '08    BS BivICD  . Cardiac defibrillator placement    . Appendectomy    . Knee surgery      right  . Ankle surgery      right  . Tubal ligation    . Cervical spine surgery      plate   . Coronary angiogram  2010    FAMHx: Family History  Problem Relation Age of Onset  . Breast cancer Mother   . Diabetes Mother   . Heart disease Maternal Grandmother   . Kidney disease Maternal Grandmother   . Diabetes Maternal Grandmother   . Glaucoma Maternal Aunt   . Heart disease Maternal Aunt     SOCHx:  reports that she has quit smoking. Her  smoking use included Cigarettes. She smoked 0.00 packs per day. She has never used smokeless tobacco. She reports that she does not drink alcohol or use illicit drugs.  ALLERGIES: Allergies  Allergen Reactions  . Ivp Dye (Iodinated Diagnostic Agents) Shortness Of Breath  . Shellfish-Derived Products Anaphylaxis  . Sulfa Antibiotics Shortness Of Breath  . Iodine Hives    ROS: Pertinent items are noted in HPI. SEE H&P  HOME MEDICATIONS: Prescriptions prior to admission  Medication Sig Dispense Refill  . albuterol (PROVENTIL HFA;VENTOLIN HFA) 108 (90 BASE) MCG/ACT inhaler Inhale 2 puffs into the lungs every 6 (six) hours as needed. For shortness of breath      . amLODipine (NORVASC) 10 MG tablet Take 10 mg by mouth daily.      [name] aspirin EC 81 MG tablet Take 81 mg by mouth daily.      . budesonide-formoterol (SYMBICORT) 160-4.5 MCG/ACT inhaler Inhale 2 puffs into the lungs 2 (two) times daily.      . busPIRone (BUSPAR) 5 MG tablet Take 5 mg by mouth 2 (two) times daily as needed (for nerves).       [name]  cetirizine (ZYRTEC) 10 MG tablet Take 10 mg by mouth daily as needed for allergies.      [name] docusate sodium (COLACE) 100 MG capsule Take 100 mg by mouth at bedtime. constipation      . fluticasone (FLONASE) 50 MCG/ACT nasal spray Place 2 sprays into the nose daily as needed for rhinitis or allergies.       . furosemide (LASIX) 40 MG tablet Take 40 mg by mouth 2 (two) times daily.        [name] guaifenesin (HUMIBID E) 400 MG TABS Take 400 mg by mouth at bedtime as needed (congestion).      [name] losartan (COZAAR) 100 MG tablet Take 100 mg by mouth 2 (two) times daily.       . metoprolol succinate (TOPROL-XL) 50 MG 24 hr tablet Take 75 mg by mouth every morning.      [name] omeprazole (PRILOSEC) 20 MG capsule Take 20 mg by mouth 3 (three) times daily.      [name] oxymetazoline (AFRIN) 0.05 % nasal spray Place 1 spray into the nose at bedtime as needed. For congestion      . potassium chloride SA (K-DUR,KLOR-CON)  20 MEQ tablet Take 20 mEq by mouth 2 (two) times daily.        . pravastatin (PRAVACHOL) 40 MG tablet Take 40 mg by mouth daily.      [name] spironolactone (ALDACTONE) 25 MG tablet Take 12.5 mg by mouth 2 (two) times daily.        . traMADol (ULTRAM) 50 MG tablet Take 50 mg by mouth every 6 (six) hours as needed. pain        HOSPITAL MEDICATIONS: I have reviewed the patient's current medications.  VITALS: Blood pressure 166/73, pulse 69, temperature 98.3 F (36.8 C), temperature source Oral, resp. rate 13, height 5\' 5"  (1.651 m), weight 111.5 kg (245 lb 13 oz), SpO2 94.00%.  PHYSICAL EXAM: General appearance: alert, cooperative, no distress and morbidly obese Neck: no carotid bruit and no JVD Lungs: clear to auscultation bilaterally, non-labored Heart: regular rate and rhythm, distant heart sounds. Abdomen: morbidly obese, non-tender, NABS Extremities: no edema, no venous stasis. Pulses: 1-2+ Skin: pale, cool, and dry  Neurologic: Grossly normal, CN II-XII grossly intact.  LABS: Results for orders placed during the hospital encounter of 07/22/12 (from the past 48 hour(s))  CBC     Status: None   Collection Time    07/22/12  8:27 PM      Result Value Range   WBC 7.3  4.0 - 10.5 K/uL   RBC 4.96  3.87 - 5.11 MIL/uL   Hemoglobin 14.2  12.0 - 15.0 g/dL   HCT 42.6  36.0 - 46.0 %   MCV 85.9  78.0 - 100.0 fL   MCH 28.6  26.0 - 34.0 pg   MCHC 33.3  30.0 - 36.0 g/dL   RDW 14.3  11.5 - 15.5 %   Platelets 173  150 - 400 K/uL  BASIC METABOLIC PANEL     Status: Abnormal   Collection Time    07/22/12  8:27 PM      Result Value Range   Sodium 138  135 - 145 mEq/L   Potassium 4.0  3.5 - 5.1 mEq/L   Chloride 104  96 - 112 mEq/L   CO2 26  19 - 32 mEq/L   Glucose, Bld 103 (*) 70 - 99 mg/dL   BUN 22  6 - 23 mg/dL   Creatinine, Ser 0.95  0.50 - 1.10 mg/dL   Calcium 9.6  8.4 - 10.5 mg/dL   GFR calc non Af Amer 60 (*) >90 mL/min   GFR calc Af Amer 70 (*) >90 mL/min   Comment:            The  eGFR has been calculated     using the CKD EPI equation.     This calculation has not been     validated in all clinical     situations.     eGFR's persistently     <90 mL/min signify     possible Chronic Kidney Disease.  PRO B NATRIURETIC PEPTIDE     Status: Abnormal   Collection Time    07/22/12  8:27 PM      Result Value Range   Pro B Natriuretic peptide (BNP) 178.9 (*) 0 - 125 pg/mL  POCT I-STAT TROPONIN I     Status: None   Collection Time    07/22/12  8:47 PM      Result Value Range   Troponin i, poc 0.00  0.00 - 0.08 ng/mL   Comment 3            Comment: Due to the release kinetics of cTnI,     a negative result within the first hours     of the onset of symptoms does not rule out     myocardial infarction with certainty.     If myocardial infarction is still suspected,     repeat the test at appropriate intervals.  MRSA PCR SCREENING     Status: None   Collection Time    07/23/12  1:24 AM      Result Value Range   MRSA by PCR NEGATIVE  NEGATIVE   Comment:            The GeneXpert MRSA Assay (FDA     approved for NASAL specimens     only), is one component of a     comprehensive MRSA colonization     surveillance program. It is not     intended to diagnose MRSA     infection nor to guide or     monitor treatment for     MRSA infections.  PROTIME-INR     Status: None   Collection Time    07/23/12  2:04 AM      Result Value Range   Prothrombin Time 12.2  11.6 - 15.2 seconds   INR 0.91  0.00 - 1.49  APTT     Status: None   Collection Time    07/23/12  2:04 AM      Result Value Range   aPTT 33  24 - 37 seconds  TROPONIN I     Status: None   Collection Time    07/23/12  2:04 AM      Result Value Range   Troponin I <0.30  <0.30 ng/mL   Comment:            Due to the release kinetics of cTnI,     a negative result within the first hours     of the onset of symptoms does not rule out     myocardial infarction with certainty.     If myocardial infarction is  still suspected,     repeat the test at appropriate intervals.  CBC     Status: None   Collection Time    07/23/12  2:04 AM  Result Value Range   WBC 6.4  4.0 - 10.5 K/uL   RBC 5.04  3.87 - 5.11 MIL/uL   Hemoglobin 14.2  12.0 - 15.0 g/dL   HCT 43.8  36.0 - 46.0 %   MCV 86.9  78.0 - 100.0 fL   MCH 28.2  26.0 - 34.0 pg   MCHC 32.4  30.0 - 36.0 g/dL   RDW 14.5  11.5 - 15.5 %   Platelets 164  150 - 400 K/uL  CREATININE, SERUM     Status: Abnormal   Collection Time    07/23/12  2:04 AM      Result Value Range   Creatinine, Ser 0.97  0.50 - 1.10 mg/dL   GFR calc non Af Amer 59 (*) >90 mL/min   GFR calc Af Amer 68 (*) >90 mL/min   Comment:            The eGFR has been calculated     using the CKD EPI equation.     This calculation has not been     validated in all clinical     situations.     eGFR's persistently     <90 mL/min signify     possible Chronic Kidney Disease.  CBC     Status: Abnormal   Collection Time    07/23/12  6:45 AM      Result Value Range   WBC 5.5  4.0 - 10.5 K/uL   RBC 4.89  3.87 - 5.11 MIL/uL   Hemoglobin 14.0  12.0 - 15.0 g/dL   HCT 42.4  36.0 - 46.0 %   MCV 86.7  78.0 - 100.0 fL   MCH 28.6  26.0 - 34.0 pg   MCHC 33.0  30.0 - 36.0 g/dL   RDW 14.3  11.5 - 15.5 %   Platelets 144 (*) 150 - 400 K/uL  BASIC METABOLIC PANEL     Status: Abnormal   Collection Time    07/23/12  6:45 AM      Result Value Range   Sodium 140  135 - 145 mEq/L   Potassium 3.7  3.5 - 5.1 mEq/L   Chloride 105  96 - 112 mEq/L   CO2 29  19 - 32 mEq/L   Glucose, Bld 101 (*) 70 - 99 mg/dL   BUN 18  6 - 23 mg/dL   Creatinine, Ser 1.00  0.50 - 1.10 mg/dL   Calcium 9.0  8.4 - 10.5 mg/dL   GFR calc non Af Amer 57 (*) >90 mL/min   GFR calc Af Amer 66 (*) >90 mL/min   Comment:            The eGFR has been calculated     using the CKD EPI equation.     This calculation has not been     validated in all clinical     situations.     eGFR's persistently     <90 mL/min signify      possible Chronic Kidney Disease.  PROTIME-INR     Status: None   Collection Time    07/23/12  6:45 AM      Result Value Range   Prothrombin Time 12.5  11.6 - 15.2 seconds   INR 0.94  0.00 - 1.49  TROPONIN I     Status: None   Collection Time    07/23/12  6:45 AM      Result Value Range   Troponin I <0.30  <0.30 ng/mL  Comment:            Due to the release kinetics of cTnI,     a negative result within the first hours     of the onset of symptoms does not rule out     myocardial infarction with certainty.     If myocardial infarction is still suspected,     repeat the test at appropriate intervals.  LIPID PANEL     Status: Abnormal   Collection Time    07/23/12  6:45 AM      Result Value Range   Cholesterol 265 (*) 0 - 200 mg/dL   Triglycerides 169 (*) <150 mg/dL   HDL 50  >39 mg/dL   Total CHOL/HDL Ratio 5.3     VLDL 34  0 - 40 mg/dL   LDL Cholesterol 181 (*) 0 - 99 mg/dL   Comment:            Total Cholesterol/HDL:CHD Risk     Coronary Heart Disease Risk Table                         Men   Women      1/2 Average Risk   3.4   3.3      Average Risk       5.0   4.4      2 X Average Risk   9.6   7.1      3 X Average Risk  23.4   11.0                Use the calculated Patient Ratio     above and the CHD Risk Table     to determine the patient's CHD Risk.                ATP III CLASSIFICATION (LDL):      <100     mg/dL   Optimal      100-129  mg/dL   Near or Above                        Optimal      130-159  mg/dL   Borderline      160-189  mg/dL   High      >190     mg/dL   Very High    EKG: Paced  IMAGING: Dg Chest 2 View  07/22/2012   *RADIOLOGY REPORT*  Clinical Data: Chest pain  CHEST - 2 VIEW  Comparison: 11/22/2011  Findings: Cardiac enlargement.  Negative for heart failure.  AICD with multiple cardiac leads, unchanged.  Negative for pneumonia or effusion.  Lungs are clear.  IMPRESSION: Cardiac enlargement without acute abnormality.   Original Report  Authenticated By: Carl Best, M.D.    IMPRESSION: Principal Problem:   Unstable angina Active Problems:   CAD, 40% LAD 2010   PTVDP 2001, upgraded to BS BiV device with good response 2008   Cardiomyopathy, nonischemic, Bi V responder,  EF >55% by 2D Oct 2013   Allergic to IV contrast   HYPERTENSION   Morbid obesity   COPD (chronic obstructive pulmonary disease), asthmatic component   GERD (gastroesophageal reflux disease)   Sleep apnea, C-pap intol   ASTHMA, UNSPECIFIED, UNSPECIFIED STATUS   RECOMMENDATION: Discussed with Dr Ellyn Hack- will proceed to cath. She will require contrast allergy prophylaxis.  Time Spent Directly with Patient: 45 minutes  Erlene Quan [id number] beeper 07/23/2012, 8:27  AM   ATTENDING ATTESTATION:  I have seen and examined the patient along with Jana Half.  I have reviewed the chart, notes and new data.  I agree with his's findings, examination & recommendations as noted above.  Brief Description: Morbidly obese 67 y/o woman with COPD, moderate non-obstructive CAD by cath in 2010, NICM s/p BiVICD with good response - now EF ~55%. Admitted with several weeks of SSCP, that is worse with rest. Says it radiates to jaw & L shoulder.  Interestingly, despite prolonged pain PTA, she has r/o for MI.  Still c/o chest "pressure" while lying in Bed.  BP remains elevated - amlodipine recently added by Dr. Rollene Fare.   Has OSA - but cannot tolerate CPAP -- needs to be assess for potential use of nasal pillows (will discuss with Dr. Claiborne Billings).   Key examination changes: agree with exam, unable to assess for JVD or HSM due to morbid obesity.  Distant heart sounds, no active wheezing  Key new findings / data: Troponin negative  Lipids - poorly controlled on statin --  BP poorly controlled -- will start Nitrate if possible   PLAN:  Given her h/o moderate CAD & the constellation of Sx - we must first & foremost rule out ACS/Unstable Angina as the potential  cause.  Will plan LHC with Coronary Angiography, hopefully radial approach (due to morbid obesity) this AM.   This procedure has been fully reviewed with the patient and written informed consent has been obtained.   Could also be A on C diastolic HF with poorly controlled HTN -- will need more aggressive control; will see her LVEDP on cath.  Will probably need additional dose vs. More potent statin  Need to address issue of CPAP  [facility] will be happy to take over as primary service.  Appreciate [facility] assistance with admission & overnight care.  Leonie Man, M.D., M.S. [facility] [address] [place], [place]  [address]  [phone]  07/23/2012 8:35 AM

## 2012-07-24 DIAGNOSIS — I2510 Atherosclerotic heart disease of native coronary artery without angina pectoris: Secondary | ICD-10-CM

## 2012-07-24 MED ORDER — LOPERAMIDE HCL 2 MG PO CAPS
4.0000 mg | ORAL_CAPSULE | Freq: Once | ORAL | Status: AC
  Administered 2012-07-24: 4 mg via ORAL
  Filled 2012-07-24: qty 2

## 2012-07-24 MED ORDER — HYDRALAZINE HCL 25 MG PO TABS: 25.0000 mg | ORAL_TABLET | Freq: Three times a day (TID) | ORAL | Status: DC

## 2012-07-24 MED ORDER — AMLODIPINE BESYLATE 10 MG PO TABS: 10.0000 mg | ORAL_TABLET | Freq: Every day | ORAL | Status: DC

## 2012-07-24 MED ORDER — ISOSORBIDE MONONITRATE ER 30 MG PO TB24
30.0000 mg | ORAL_TABLET | Freq: Every day | ORAL | Status: DC
  Administered 2012-07-24: 30 mg via ORAL
  Filled 2012-07-24: qty 1

## 2012-07-24 MED ORDER — ACETAMINOPHEN 325 MG PO TABS: 650.0000 mg | ORAL_TABLET | ORAL | Status: DC | PRN

## 2012-07-24 MED ORDER — HYDRALAZINE HCL 25 MG PO TABS
25.0000 mg | ORAL_TABLET | Freq: Two times a day (BID) | ORAL | Status: DC
  Administered 2012-07-24: 25 mg via ORAL
  Filled 2012-07-24 (×2): qty 1

## 2012-07-24 MED ORDER — ISOSORBIDE MONONITRATE ER 30 MG PO TB24: 30.0000 mg | ORAL_TABLET | Freq: Every day | ORAL | Status: DC

## 2012-07-24 MED ORDER — NITROGLYCERIN 0.4 MG SL SUBL: 0.4000 mg | SUBLINGUAL_TABLET | SUBLINGUAL | Status: DC | PRN

## 2012-07-24 MED ORDER — AMLODIPINE BESYLATE 10 MG PO TABS
10.0000 mg | ORAL_TABLET | Freq: Every day | ORAL | Status: DC
  Administered 2012-07-24: 10 mg via ORAL
  Filled 2012-07-24: qty 1

## 2012-07-24 NOTE — Progress Notes (Signed)
Report from Night RN. Chart reviewed together. Handoff complete.Introductions complete. Will continue to monitor and advise attending as needed.

## 2012-07-24 NOTE — Discharge Summary (Signed)
Patient ID: [name],  MRN: [id number], DOB/AGE: 68/26/1947 68 y.o.  Admit date: 07/22/2012 Discharge date: 07/24/2012  Primary Care Provider: Dr Arville Care Primary Cardiologist: Dr Rollene Fare  Discharge Diagnoses Principal Problem:   Unstable angina Active Problems:   HYPERTENSION- ;poor control   CAD, [id number] LAD with systolic bridging- 123XX123 [id number]   PTVDP 2001, upgraded to [facility] BiV ICD Sept 2008   Cardiomyopathy, nonischemic, Bi V responder,  EF >55% by 2D Oct 2013   Allergic to IV contrast   Morbid obesity   COPD (chronic obstructive pulmonary disease), asthmatic component   GERD (gastroesophageal reflux disease)   Sleep apnea, C-pap intol   ASTHMA, UNSPECIFIED, UNSPECIFIED STATUS    Procedures: Coronary angiogram 07/23/12   Hospital Course   67 y/o morbidly obese female followed by Dr Rollene Fare with a history of non obstructive CAD by cath 2010 (40% LAD). She has a history of PTVDP in 2001 with an upgrade to a BiV in 2008 for NICM (EF 15-20% then). She is a responder, EF >55% in Oct 2013. She just saw Dr Rollene Fare in the office in April. She apparently is nearing end of life on her BiV. Dr Rollene Fare added Norvasc for HTN when he saw her. She was admitted 07/22/12 with chest pain consistent with unstable angina. She ruled out for an MI. She underwent coronary angiogram 07/23/12. This revealed [id number] LAD with systolic bridging to 123XX123. She is morbidly obese and has known sleep apnea but has been intol to C-pap in the past but admits this was many years ago. She was HTN in the hospital and her medications were adjusted. We probably would have kept her one more day but she had a birthday party for her grandson to attend. She has an appointment with Dr Rollene Fare next Tuesday. I will arrange for a sleep study to be repeated and hopefully she may be able to tolerate one of the newer masks.    Discharge Vitals:  Blood pressure 190/95, pulse 72, temperature 98.2 F (36.8 C), temperature source  Oral, resp. rate 19, height 5\' 5"  (1.651 m), weight 111.5 kg (245 lb 13 oz), SpO2 96.00%.    Labs: Results for orders placed during the hospital encounter of 07/22/12 (from the past 48 hour(s))  CBC     Status: None   Collection Time    07/22/12  8:27 PM      Result Value Range   WBC 7.3  4.0 - 10.5 K/uL   RBC 4.96  3.87 - 5.11 MIL/uL   Hemoglobin 14.2  12.0 - 15.0 g/dL   HCT 42.6  36.0 - 46.0 %   MCV 85.9  78.0 - 100.0 fL   MCH 28.6  26.0 - 34.0 pg   MCHC 33.3  30.0 - 36.0 g/dL   RDW 14.3  11.5 - 15.5 %   Platelets 173  150 - 400 K/uL  BASIC METABOLIC PANEL     Status: Abnormal   Collection Time    07/22/12  8:27 PM      Result Value Range   Sodium 138  135 - 145 mEq/L   Potassium 4.0  3.5 - 5.1 mEq/L   Chloride 104  96 - 112 mEq/L   CO2 26  19 - 32 mEq/L   Glucose, Bld 103 (*) 70 - 99 mg/dL   BUN 22  6 - 23 mg/dL   Creatinine, Ser 0.95  0.50 - 1.10 mg/dL   Calcium 9.6  8.4 - 10.5 mg/dL  GFR calc non Af Amer 60 (*) >90 mL/min   GFR calc Af Amer 70 (*) >90 mL/min   Comment:            The eGFR has been calculated     using the CKD EPI equation.     This calculation has not been     validated in all clinical     situations.     eGFR's persistently     <90 mL/min signify     possible Chronic Kidney Disease.  PRO B NATRIURETIC PEPTIDE     Status: Abnormal   Collection Time    07/22/12  8:27 PM      Result Value Range   Pro B Natriuretic peptide (BNP) 178.9 (*) 0 - 125 pg/mL  POCT I-STAT TROPONIN I     Status: None   Collection Time    07/22/12  8:47 PM      Result Value Range   Troponin i, poc 0.00  0.00 - 0.08 ng/mL   Comment 3            Comment: Due to the release kinetics of cTnI,     a negative result within the first hours     of the onset of symptoms does not rule out     myocardial infarction with certainty.     If myocardial infarction is still suspected,     repeat the test at appropriate intervals.  MRSA PCR SCREENING     Status: None   Collection  Time    07/23/12  1:24 AM      Result Value Range   MRSA by PCR NEGATIVE  NEGATIVE   Comment:            The GeneXpert MRSA Assay (FDA     approved for NASAL specimens     only), is one component of a     comprehensive MRSA colonization     surveillance program. It is not     intended to diagnose MRSA     infection nor to guide or     monitor treatment for     MRSA infections.  PROTIME-INR     Status: None   Collection Time    07/23/12  2:04 AM      Result Value Range   Prothrombin Time 12.2  11.6 - 15.2 seconds   INR 0.91  0.00 - 1.49  APTT     Status: None   Collection Time    07/23/12  2:04 AM      Result Value Range   aPTT 33  24 - 37 seconds  TSH     Status: Abnormal   Collection Time    07/23/12  2:04 AM      Result Value Range   TSH 5.217 (*) 0.350 - 4.500 uIU/mL  HEMOGLOBIN A1C     Status: Abnormal   Collection Time    07/23/12  2:04 AM      Result Value Range   Hemoglobin A1C 5.9 (*) <5.7 %   Comment: (NOTE)  According to the ADA Clinical Practice Recommendations for 2011, when     HbA1c is used as a screening test:      >=6.5%   Diagnostic of Diabetes Mellitus               (if abnormal result is confirmed)     5.7-6.4%   Increased risk of developing Diabetes Mellitus     References:Diagnosis and Classification of Diabetes Mellitus,Diabetes     [id number] 1):S62-S69 and Standards of Medical Care in             Diabetes - 2011,Diabetes [id number] (Suppl 1):S11-S61.   Mean Plasma Glucose 123 (*) <117 mg/dL  TROPONIN I     Status: None   Collection Time    07/23/12  2:04 AM      Result Value Range   Troponin I <0.30  <0.30 ng/mL   Comment:            Due to the release kinetics of cTnI,     a negative result within the first hours     of the onset of symptoms does not rule out     myocardial infarction with certainty.     If myocardial infarction is still suspected,      repeat the test at appropriate intervals.  CBC     Status: None   Collection Time    07/23/12  2:04 AM      Result Value Range   WBC 6.4  4.0 - 10.5 K/uL   RBC 5.04  3.87 - 5.11 MIL/uL   Hemoglobin 14.2  12.0 - 15.0 g/dL   HCT 43.8  36.0 - 46.0 %   MCV 86.9  78.0 - 100.0 fL   MCH 28.2  26.0 - 34.0 pg   MCHC 32.4  30.0 - 36.0 g/dL   RDW 14.5  11.5 - 15.5 %   Platelets 164  150 - 400 K/uL  CREATININE, SERUM     Status: Abnormal   Collection Time    07/23/12  2:04 AM      Result Value Range   Creatinine, Ser 0.97  0.50 - 1.10 mg/dL   GFR calc non Af Amer 59 (*) >90 mL/min   GFR calc Af Amer 68 (*) >90 mL/min   Comment:            The eGFR has been calculated     using the CKD EPI equation.     This calculation has not been     validated in all clinical     situations.     eGFR's persistently     <90 mL/min signify     possible Chronic Kidney Disease.  CBC     Status: Abnormal   Collection Time    07/23/12  6:45 AM      Result Value Range   WBC 5.5  4.0 - 10.5 K/uL   RBC 4.89  3.87 - 5.11 MIL/uL   Hemoglobin 14.0  12.0 - 15.0 g/dL   HCT 42.4  36.0 - 46.0 %   MCV 86.7  78.0 - 100.0 fL   MCH 28.6  26.0 - 34.0 pg   MCHC 33.0  30.0 - 36.0 g/dL   RDW 14.3  11.5 - 15.5 %   Platelets 144 (*) 150 - 400 K/uL  BASIC METABOLIC PANEL     Status: Abnormal   Collection Time    07/23/12  6:45 AM      Result Value Range  Sodium 140  135 - 145 mEq/L   Potassium 3.7  3.5 - 5.1 mEq/L   Chloride 105  96 - 112 mEq/L   CO2 29  19 - 32 mEq/L   Glucose, Bld 101 (*) 70 - 99 mg/dL   BUN 18  6 - 23 mg/dL   Creatinine, Ser 1.00  0.50 - 1.10 mg/dL   Calcium 9.0  8.4 - 10.5 mg/dL   GFR calc non Af Amer 57 (*) >90 mL/min   GFR calc Af Amer 66 (*) >90 mL/min   Comment:            The eGFR has been calculated     using the CKD EPI equation.     This calculation has not been     validated in all clinical     situations.     eGFR's persistently     <90 mL/min signify     possible Chronic  Kidney Disease.  PROTIME-INR     Status: None   Collection Time    07/23/12  6:45 AM      Result Value Range   Prothrombin Time 12.5  11.6 - 15.2 seconds   INR 0.94  0.00 - 1.49  TROPONIN I     Status: None   Collection Time    07/23/12  6:45 AM      Result Value Range   Troponin I <0.30  <0.30 ng/mL   Comment:            Due to the release kinetics of cTnI,     a negative result within the first hours     of the onset of symptoms does not rule out     myocardial infarction with certainty.     If myocardial infarction is still suspected,     repeat the test at appropriate intervals.  LIPID PANEL     Status: Abnormal   Collection Time    07/23/12  6:45 AM      Result Value Range   Cholesterol 265 (*) 0 - 200 mg/dL   Triglycerides 169 (*) <150 mg/dL   HDL 50  >39 mg/dL   Total CHOL/HDL Ratio 5.3     VLDL 34  0 - 40 mg/dL   LDL Cholesterol 181 (*) 0 - 99 mg/dL   Comment:            Total Cholesterol/HDL:CHD Risk     Coronary Heart Disease Risk Table                         Men   Women      1/2 Average Risk   3.4   3.3      Average Risk       5.0   4.4      2 X Average Risk   9.6   7.1      3 X Average Risk  23.4   11.0                Use the calculated Patient Ratio     above and the CHD Risk Table     to determine the patient's CHD Risk.                ATP III CLASSIFICATION (LDL):      <100     mg/dL   Optimal      100-129  mg/dL   Near or Above  Optimal      130-159  mg/dL   Borderline      160-189  mg/dL   High      >190     mg/dL   Very High  TROPONIN I     Status: None   Collection Time    07/23/12 12:59 PM      Result Value Range   Troponin I <0.30  <0.30 ng/mL   Comment:            Due to the release kinetics of cTnI,     a negative result within the first hours     of the onset of symptoms does not rule out     myocardial infarction with certainty.     If myocardial infarction is still suspected,     repeat the test at  appropriate intervals.    Disposition:  Follow-up Information   Follow up with Rebecca Eaton, MD. (Keep your appointment Tuesday)    Contact information:   [address] [place] [address] [phone]       Discharge Medications:    Medication List    TAKE these medications       acetaminophen 325 MG tablet  Commonly known as:  TYLENOL  Take 2 tablets (650 mg total) by mouth every 4 (four) hours as needed for pain or fever.     albuterol 108 (90 BASE) MCG/ACT inhaler  Commonly known as:  PROVENTIL HFA;VENTOLIN HFA  Inhale 2 puffs into the lungs every 6 (six) hours as needed. For shortness of breath     amLODipine 10 MG tablet  Commonly known as:  NORVASC  Take 10 mg by mouth daily.     aspirin EC 81 MG tablet  Take 81 mg by mouth daily.     budesonide-formoterol 160-4.5 MCG/ACT inhaler  Commonly known as:  SYMBICORT  Inhale 2 puffs into the lungs 2 (two) times daily.     busPIRone 5 MG tablet  Commonly known as:  BUSPAR  Take 5 mg by mouth 2 (two) times daily as needed (for nerves).     cetirizine 10 MG tablet  Commonly known as:  ZYRTEC  Take 10 mg by mouth daily as needed for allergies.     docusate sodium 100 MG capsule  Commonly known as:  COLACE  Take 100 mg by mouth at bedtime. constipation     fluticasone 50 MCG/ACT nasal spray  Commonly known as:  FLONASE  Place 2 sprays into the nose daily as needed for rhinitis or allergies.     furosemide 40 MG tablet  Commonly known as:  LASIX  Take 40 mg by mouth 2 (two) times daily.     guaifenesin 400 MG Tabs  Commonly known as:  HUMIBID E  Take 400 mg by mouth at bedtime as needed (congestion).     hydrALAZINE 25 MG tablet  Commonly known as:  APRESOLINE  Take 1 tablet (25 mg total) by mouth 3 (three) times daily.     isosorbide mononitrate 30 MG 24 hr tablet  Commonly known as:  IMDUR  Take 1 tablet (30 mg total) by mouth daily.     losartan 100 MG tablet  Commonly known  as:  COZAAR  Take 100 mg by mouth 2 (two) times daily.     metoprolol succinate 50 MG 24 hr tablet  Commonly known as:  TOPROL-XL  Take 75 mg by mouth every morning.     nitroGLYCERIN 0.4  MG SL tablet  Commonly known as:  NITROSTAT  Place 1 tablet (0.4 mg total) under the tongue every 5 (five) minutes x 3 doses as needed for chest pain.     omeprazole 20 MG capsule  Commonly known as:  PRILOSEC  Take 20 mg by mouth 3 (three) times daily.     oxymetazoline 0.05 % nasal spray  Commonly known as:  AFRIN  Place 1 spray into the nose at bedtime as needed. For congestion     potassium chloride SA 20 MEQ tablet  Commonly known as:  K-DUR,KLOR-CON  Take 20 mEq by mouth 2 (two) times daily.     pravastatin 40 MG tablet  Commonly known as:  PRAVACHOL  Take 40 mg by mouth daily.     spironolactone 25 MG tablet  Commonly known as:  ALDACTONE  Take 12.5 mg by mouth 2 (two) times daily.     traMADol 50 MG tablet  Commonly known as:  ULTRAM  Take 50 mg by mouth every 6 (six) hours as needed. pain         Duration of Discharge Encounter: Greater than 30 minutes including physician time.  Angelena Form PA-C 07/24/2012 10:53 AM

## 2012-07-24 NOTE — Progress Notes (Signed)
Subjective:  No chest pain.  Objective:  Vital Signs in the last 24 hours: Temp:  [97.5 F (36.4 C)-98.9 F (37.2 C)] 98.2 F (36.8 C) (06/14 0757) Pulse Rate:  [69-85] 72 (06/14 0335) Resp:  [19-34] 19 (06/13 1600) BP: (151-192)/(65-109) 190/95 mmHg (06/14 0757) SpO2:  [92 %-97 %] 96 % (06/14 0842)  Intake/Output from previous day:  Intake/Output Summary (Last 24 hours) at 07/24/12 0846 Last data filed at 07/24/12 0600  Gross per 24 hour  Intake 1946.25 ml  Output   1375 ml  Net 571.25 ml    Physical Exam: General appearance: alert, cooperative and no distress Lungs: clear to auscultation bilaterally Heart: regular rate and rhythm Extrem: Rt groin without hematoma   Rate: 74  Rhythm: paced  Lab Results:  Recent Labs  07/23/12 0204 07/23/12 0645  WBC 6.4 5.5  HGB 14.2 14.0  PLT 164 144*    Recent Labs  07/22/12 2027 07/23/12 0204 07/23/12 0645  NA 138  --  140  K 4.0  --  3.7  CL 104  --  105  CO2 26  --  29  GLUCOSE 103*  --  101*  BUN 22  --  18  CREATININE 0.95 0.97 1.00    Recent Labs  07/23/12 0645 07/23/12 1259  TROPONINI <0.30 <0.30   Hepatic Function Panel No results found for this basename: PROT, ALBUMIN, AST, ALT, ALKPHOS, BILITOT, BILIDIR, IBILI,  in the last 72 hours  Recent Labs  07/23/12 0645  CHOL 265*    Recent Labs  07/23/12 0645  INR 0.94    Imaging: Imaging results have been reviewed  Cardiac Studies:  Assessment/Plan:   Principal Problem:   Unstable angina Active Problems:   HYPERTENSION- ;poor control   CAD, [id number] LAD with systolic bridging- 123XX123 [id number]   PTVDP 2001, upgraded to [facility] BiV ICD Sept 2008   Cardiomyopathy, nonischemic, Bi V responder,  EF >55% by 2D Oct 2013   Allergic to IV contrast   Morbid obesity   COPD (chronic obstructive pulmonary disease), asthmatic component   GERD (gastroesophageal reflux disease)   Sleep apnea, C-pap intol   ASTHMA, UNSPECIFIED, UNSPECIFIED STATUS    PLAN:   Increase Norvasc to 10mg , add Imdur. Consider BID Hydralazine (will give one dose this am). Home later if her B/P is better. She will need EOL BiV ICD changed soon.  Kerin Ransom PA-C Beeper [id number] 07/24/2012, 8:46 AM   I have seen and examined the patient along with Kerin Ransom PA-C.  I have reviewed the chart, notes and new data.  I agree with PA's note.  Key new complaints: feels well Key examination changes: BP still very high Key new findings / data: cath findings reviewed; echo  PLAN: Adjust BP meds, probably will also need a diuretic (note sulfa allergy). If we cannot discharge today due to BP, will transfer to telemetry.  Sanda Klein, MD, [place] [phone] 07/24/2012, 9:22 AM

## 2012-07-29 ENCOUNTER — Encounter: Payer: Self-pay | Admitting: Cardiovascular Disease

## 2012-08-11 ENCOUNTER — Telehealth: Payer: Self-pay | Admitting: Cardiovascular Disease

## 2012-08-11 NOTE — Telephone Encounter (Signed)
[name] is asking for a work to return to work on Saturday .Marland KitchenShe had one but misplaced it .[name] Please Call    Thanks

## 2012-08-11 NOTE — Telephone Encounter (Signed)
Per message from pt, pt misplaced note, which was written on 6.17.14 for pt to return to work on 6.25.14.  Pt needs a copy of the note in her chart.  Message sent to medical records to process request from pt.

## 2012-08-11 NOTE — Telephone Encounter (Signed)
Returned [phone] to triage.  Note in chart is for 6/25 return date  No note for work 7/5

## 2012-08-11 NOTE — Telephone Encounter (Signed)
Message forwarded to Medical Records to review chart and process.  Paper chart# [id number].

## 2012-08-25 ENCOUNTER — Ambulatory Visit (INDEPENDENT_AMBULATORY_CARE_PROVIDER_SITE_OTHER): Payer: Medicare Other | Admitting: Internal Medicine

## 2012-08-25 ENCOUNTER — Other Ambulatory Visit: Payer: Self-pay | Admitting: Physician Assistant

## 2012-08-25 ENCOUNTER — Encounter: Payer: Self-pay | Admitting: Internal Medicine

## 2012-08-25 VITALS — BP 114/80 | HR 70 | Temp 97.3°F | Ht 62.0 in | Wt 232.2 lb

## 2012-08-25 DIAGNOSIS — R0600 Dyspnea, unspecified: Secondary | ICD-10-CM

## 2012-08-25 DIAGNOSIS — R0609 Other forms of dyspnea: Secondary | ICD-10-CM | POA: Insufficient documentation

## 2012-08-25 DIAGNOSIS — R0989 Other specified symptoms and signs involving the circulatory and respiratory systems: Secondary | ICD-10-CM

## 2012-08-25 DIAGNOSIS — J449 Chronic obstructive pulmonary disease, unspecified: Secondary | ICD-10-CM

## 2012-08-25 DIAGNOSIS — J4489 Other specified chronic obstructive pulmonary disease: Secondary | ICD-10-CM

## 2012-08-25 MED ORDER — OMEPRAZOLE 20 MG PO CPDR: DELAYED_RELEASE_CAPSULE | ORAL | Status: DC

## 2012-08-25 NOTE — Patient Instructions (Addendum)
Stop spiriva  symbicort 160 Take 2 puffs first thing in am and then another 2 puffs about 12 hours later > smooth deep breath and rinse and gargle  Only use your albuterol as a rescue medication to be used if you can't catch your breath by resting or doing a relaxed purse lip breathing pattern. The less you use it, the better it will work when you need it.   For cough try delsym 2tsp every 12 hours as needed   Try prilosec 20mg   Take 30-60 min before first and last meal  and Pepcid 20 mg one bedtime until return  GERD (REFLUX)  is an extremely common cause of respiratory symptoms, many times with no significant heartburn at all.    It can be treated with medication, but also with lifestyle changes including avoidance of late meals, excessive alcohol, smoking cessation, and avoid fatty foods, chocolate, peppermint, colas, red wine, and acidic juices such as orange juice.  NO MINT OR MENTHOL PRODUCTS SO NO COUGH DROPS  USE SUGARLESS CANDY INSTEAD (jolley ranchers or Stover's)  NO OIL BASED VITAMINS - use powdered substitutes.  See Tammy NP w/in 2 weeks with all your medications, even over the counter meds, separated in two separate bags, the ones you take no matter what vs the ones you stop once you feel better and take only as needed when you feel you need them.   Tammy  will generate for you a new user friendly medication calendar that will put [place] all on the same page re: your medication use.     Without this process, it simply isn't possible to assure that we are providing  your outpatient care  with  the attention to detail we feel you deserve.   If we cannot assure that you're getting that kind of care,  then we cannot manage your problem effectively from this clinic.  Once you have seen Tammy and we are sure that we're all on the same page with your medication use she will arrange follow up with me.

## 2012-08-25 NOTE — Progress Notes (Signed)
  Subjective:    Patient ID: [name], female    DOB: Dec 09, 1945   MRN: [id number]  HPI  28 yowf quit smoking in 1970s with h/o asthma as child but no trouble while smoking referred to pulmonary clinic 08/25/2012 for eval of sob bu [name] after admit:   Admit date: 07/22/2012  Discharge date: 07/24/2012  Primary Care Provider: Dr Arville Care  Primary Cardiologist: Dr Rollene Fare  Discharge Diagnoses  Principal Problem:  Unstable angina  Active Problems:  HYPERTENSION- ;poor control  CAD, [id number] LAD with systolic bridging- 123XX123 [id number]  PTVDP 2001, upgraded to [facility] BiV ICD Sept 2008  Cardiomyopathy, nonischemic, Bi V responder, EF >55% by 2D Oct 2013  Allergic to IV contrast  Morbid obesity  COPD (chronic obstructive pulmonary disease), asthmatic component  GERD (gastroesophageal reflux disease)  Sleep apnea, C-pap intol  ASTHMA, UNSPECIFIED, UNSPECIFIED STATUS   08/25/2012 1st pulmonary eval cc onset of sob p ET for knee surgery, variable and improved with symbicort but variably better for up to a year under the care of Dr Orvil Feil = symbicort and zytrec and nasal spray and no spiriva and no nebs  And maybe  Better p  Albuterol" if walked too fast".  Assoc with overt HB, sense of "chest congestion" but no excess mucus production   Since admission on spiriva and neb qid and some better.   No obvious daytime variabilty or assoc chronic cough or cp or chest tightness, subjective wheeze overt sinus or hb symptoms. No unusual exp hx or h/o childhood pna/ asthma or knowledge of premature birth.   Sleeping ok without nocturnal  or early am exacerbation  of respiratory  c/o's or need for noct saba. Also denies any obvious fluctuation of symptoms with weather or environmental changes or other aggravating or alleviating factors except as outlined above   Review of Systems  Constitutional: Negative for fever, chills and unexpected weight change.  HENT: Positive for congestion. Negative for  ear pain, nosebleeds, sore throat, rhinorrhea, sneezing, trouble swallowing, dental problem, voice change, postnasal drip and sinus pressure.   Eyes: Negative for visual disturbance.  Respiratory: Positive for shortness of breath. Negative for cough and choking.   Cardiovascular: Positive for chest pain and leg swelling.  Gastrointestinal: Negative for vomiting, abdominal pain and diarrhea.  Genitourinary: Negative for difficulty urinating.       Indigestion Acid HeartBurn  Musculoskeletal: Negative for arthralgias.  Skin: Negative for rash.  Neurological: Positive for headaches. Negative for tremors and syncope.  Hematological: Does not bruise/bleed easily.       Objective:   Physical Exam dentures  Elderly wf nad  Wt Readings from Last 3 Encounters:  08/25/12 232 lb 3.2 oz (105.325 kg)  07/23/12 245 lb 13 oz (111.5 kg)  07/23/12 245 lb 13 oz (111.5 kg)     HEENT mild turbinate edema.  Oropharynx no thrush or excess pnd or cobblestoning.  No JVD or cervical adenopathy. Mild accessory muscle hypertrophy. Trachea midline, nl thryroid. Chest was hyperinflated by percussion with diminished breath sounds and moderate increased exp time without wheeze. Hoover sign positive at mid inspiration. Regular rate and rhythm without murmur gallop or rub or increase P2 or edema.  Abd: no hsm, nl excursion. Ext warm without cyanosis or clubbing.       Assessment & Plan:

## 2012-08-27 NOTE — Assessment & Plan Note (Signed)
DDX of  difficult airways managment all start with A and  include Adherence, Ace Inhibitors, Acid Reflux, Active Sinus Disease, Alpha 1 Antitripsin deficiency, Anxiety masquerading as Airways dz,  ABPA,  allergy(esp in young), Aspiration (esp in elderly), Adverse effects of DPI,  Active smokers, plus two Bs  = Bronchiectasis and Beta blocker use..and one C= CHF  Adherence is always the initial "prime suspect" and is a multilayered concern that requires a "trust but verify" approach in every patient - starting with knowing how to use medications, especially inhalers, correctly, keeping up with refills and understanding the fundamental difference between maintenance and prns vs those medications only taken for a very short course and then stopped and not refilled. She is thoroughly confused with meds from multiple providers >  To keep things simple, I have asked the patient to first separate medicines that are perceived as maintenance, that is to be taken daily "no matter what", from those medicines that are taken on only on an as-needed basis and I have given the patient examples of both, and then return to see our NP to generate a  detailed  medication calendar which should be followed until the next physician sees the patient and updates it.    The proper method of use, as well as anticipated side effects, of a metered-dose inhaler are discussed and demonstrated to the patient. Improved effectiveness after extensive coaching during this visit to a level of approximately  75% so continue symbicort 160 2bid and just use saba prn  ? Acid reflux > max rx and diet reviewed  ? chf > no evidence by Echo 07/2012 reviewed  ? Anxiety > dx of exclusion   See instructions for specific recommendations which were reviewed directly with the patient who was given a copy with highlighter outlining the key components.

## 2012-09-08 ENCOUNTER — Encounter: Payer: Self-pay | Admitting: Adult Health

## 2012-09-08 ENCOUNTER — Ambulatory Visit (INDEPENDENT_AMBULATORY_CARE_PROVIDER_SITE_OTHER): Payer: Medicare Other | Admitting: Adult Health

## 2012-09-08 VITALS — BP 112/60 | HR 86 | Temp 98.4°F | Ht 62.0 in | Wt 237.0 lb

## 2012-09-08 DIAGNOSIS — G4730 Sleep apnea, unspecified: Secondary | ICD-10-CM

## 2012-09-08 DIAGNOSIS — J449 Chronic obstructive pulmonary disease, unspecified: Secondary | ICD-10-CM

## 2012-09-08 NOTE — Assessment & Plan Note (Signed)
Intolerant to CPAP May need  ONO in future

## 2012-09-08 NOTE — Assessment & Plan Note (Signed)
Asthma vs COPD with remote smoking hx.  Patient's medications were reviewed today and patient education was given. Computerized medication calendar was adjusted/completed   Plan Have her return for PFT

## 2012-09-08 NOTE — Patient Instructions (Addendum)
Follow med calendar closely and bring to each visit.  Follow up in 3-4 weeks with Wert -PFTs

## 2012-09-08 NOTE — Progress Notes (Signed)
Subjective:    Patient ID: [name], female    DOB: 1945-08-25   MRN: [id number]  HPI 63 yowf quit smoking in 1970s with h/o asthma as child but no trouble while smoking referred to pulmonary clinic 08/25/2012 for eval of sob bu [name] after admit:   Admit date: 07/22/2012  Discharge date: 07/24/2012  Primary Care Provider: Dr Arville Care  Primary Cardiologist: Dr Rollene Fare  Discharge Diagnoses  Principal Problem:  Unstable angina  Active Problems:  HYPERTENSION- ;poor control  CAD, [id number] LAD with systolic bridging- [id number]  PTVDP 2001, upgraded to [facility] BiV ICD Sept 2008  Cardiomyopathy, nonischemic, Bi V responder, EF >55% by 2D Oct 2013  Allergic to IV contrast  Morbid obesity  COPD (chronic obstructive pulmonary disease), asthmatic component  GERD (gastroesophageal reflux disease)  Sleep apnea, C-pap intol  ASTHMA, UNSPECIFIED, UNSPECIFIED STATUS   08/25/2012 1st pulmonary eval cc onset of sob p ET for knee surgery, variable and improved with symbicort but variably better for up to a year under the care of Dr Orvil Feil = symbicort and zytrec and nasal spray and no spiriva and no nebs  And maybe  Better p  Albuterol" if walked too fast".  Assoc with overt HB, sense of "chest congestion" but no excess mucus production  >>stop spiriva and added pepcid and delsym   09/08/2012 Follow up and Med review  Pt returns for 2 week follow up and med review  Seen for IOV for 2nd opinion for Dyspnea /Asthma vs COPD last ov . She was recommended to stop spiriva and continue on symbicort Twice daily   Added Pepcid At bedtime  For possible GERD component.  She says she feels she is doing okay. No flare in cough or wheezing.  Does have OSA but intolerant to CPAP . Wakes up tired a lot and feel fatigued through day.  No chest pain or edema.    Review of Systems  Constitutional:   No  weight loss, night sweats,  Fevers, chills,  +fatigue, or  lassitude.  HEENT:   No headaches,   Difficulty swallowing,  Tooth/dental problems, or  Sore throat,                No sneezing, itching, ear ache, + nasal congestion, post nasal drip,   CV:  No chest pain,  Orthopnea, PND, swelling in lower extremities, anasarca, dizziness, palpitations, syncope.   GI  No heartburn, indigestion, abdominal pain, nausea, vomiting, diarrhea, change in bowel habits, loss of appetite, bloody stools.   Resp:    No coughing up of blood.  No change in color of mucus.  No wheezing.  No chest wall deformity  Skin: no rash or lesions.  GU: no dysuria, change in color of urine, no urgency or frequency.  No flank pain, no hematuria   MS:  No joint pain or swelling.  No decreased range of motion.  No back pain.  Psych:  No change in mood or affect. No depression or anxiety.  No memory loss.          Objective:   Physical Exam dentures  Elderly wf nad   HEENT mild turbinate edema.  Oropharynx no thrush or excess pnd or cobblestoning.  No JVD or cervical adenopathy. Mild accessory muscle hypertrophy. Trachea midline, nl thryroid. Chest was hyperinflated by percussion with diminished breath sounds and moderate increased exp time without wheeze. Hoover sign positive at mid inspiration. Regular rate and rhythm without murmur gallop or  rub or increase P2 or edema.  Abd: no hsm, nl excursion. Ext warm without cyanosis or clubbing.       Assessment & Plan:

## 2012-09-17 ENCOUNTER — Institutional Professional Consult (permissible substitution): Payer: Medicare Other | Admitting: Internal Medicine

## 2012-09-21 LAB — PACEMAKER DEVICE OBSERVATION

## 2012-09-25 ENCOUNTER — Emergency Department (HOSPITAL_COMMUNITY): Payer: Medicare Other

## 2012-09-25 ENCOUNTER — Emergency Department (HOSPITAL_COMMUNITY)
Admission: EM | Admit: 2012-09-25 | Discharge: 2012-09-25 | Disposition: A | Discharge status: Home / Self Care | Payer: Medicare Other | Attending: Emergency Medicine | Admitting: Emergency Medicine

## 2012-09-25 ENCOUNTER — Encounter (HOSPITAL_COMMUNITY): Payer: Self-pay | Admitting: Emergency Medicine

## 2012-09-25 DIAGNOSIS — M549 Dorsalgia, unspecified: Secondary | ICD-10-CM

## 2012-09-25 DIAGNOSIS — Y929 Unspecified place or not applicable: Secondary | ICD-10-CM | POA: Insufficient documentation

## 2012-09-25 DIAGNOSIS — W19XXXA Unspecified fall, initial encounter: Secondary | ICD-10-CM

## 2012-09-25 DIAGNOSIS — S42202A Unspecified fracture of upper end of left humerus, initial encounter for closed fracture: Secondary | ICD-10-CM

## 2012-09-25 DIAGNOSIS — S42209A Unspecified fracture of upper end of unspecified humerus, initial encounter for closed fracture: Secondary | ICD-10-CM | POA: Insufficient documentation

## 2012-09-25 DIAGNOSIS — M129 Arthropathy, unspecified: Secondary | ICD-10-CM | POA: Insufficient documentation

## 2012-09-25 DIAGNOSIS — W010XXA Fall on same level from slipping, tripping and stumbling without subsequent striking against object, initial encounter: Secondary | ICD-10-CM | POA: Insufficient documentation

## 2012-09-25 DIAGNOSIS — Y9389 Activity, other specified: Secondary | ICD-10-CM | POA: Insufficient documentation

## 2012-09-25 DIAGNOSIS — I509 Heart failure, unspecified: Secondary | ICD-10-CM | POA: Insufficient documentation

## 2012-09-25 DIAGNOSIS — Z95810 Presence of automatic (implantable) cardiac defibrillator: Secondary | ICD-10-CM | POA: Insufficient documentation

## 2012-09-25 DIAGNOSIS — Z7982 Long term (current) use of aspirin: Secondary | ICD-10-CM | POA: Insufficient documentation

## 2012-09-25 DIAGNOSIS — E669 Obesity, unspecified: Secondary | ICD-10-CM | POA: Insufficient documentation

## 2012-09-25 DIAGNOSIS — Z860100 Personal history of colon polyps, unspecified: Secondary | ICD-10-CM | POA: Insufficient documentation

## 2012-09-25 DIAGNOSIS — K219 Gastro-esophageal reflux disease without esophagitis: Secondary | ICD-10-CM | POA: Insufficient documentation

## 2012-09-25 DIAGNOSIS — Z8719 Personal history of other diseases of the digestive system: Secondary | ICD-10-CM | POA: Insufficient documentation

## 2012-09-25 DIAGNOSIS — Z79899 Other long term (current) drug therapy: Secondary | ICD-10-CM | POA: Insufficient documentation

## 2012-09-25 DIAGNOSIS — F411 Generalized anxiety disorder: Secondary | ICD-10-CM | POA: Insufficient documentation

## 2012-09-25 DIAGNOSIS — Z87891 Personal history of nicotine dependence: Secondary | ICD-10-CM | POA: Insufficient documentation

## 2012-09-25 DIAGNOSIS — Z862 Personal history of diseases of the blood and blood-forming organs and certain disorders involving the immune mechanism: Secondary | ICD-10-CM | POA: Insufficient documentation

## 2012-09-25 DIAGNOSIS — I10 Essential (primary) hypertension: Secondary | ICD-10-CM | POA: Insufficient documentation

## 2012-09-25 DIAGNOSIS — I2510 Atherosclerotic heart disease of native coronary artery without angina pectoris: Secondary | ICD-10-CM | POA: Insufficient documentation

## 2012-09-25 DIAGNOSIS — IMO0002 Reserved for concepts with insufficient information to code with codable children: Secondary | ICD-10-CM | POA: Insufficient documentation

## 2012-09-25 DIAGNOSIS — J45909 Unspecified asthma, uncomplicated: Secondary | ICD-10-CM | POA: Insufficient documentation

## 2012-09-25 MED ORDER — MORPHINE SULFATE 4 MG/ML IJ SOLN
4.0000 mg | Freq: Once | INTRAMUSCULAR | Status: DC
  Filled 2012-09-25: qty 1

## 2012-09-25 MED ORDER — MORPHINE SULFATE 4 MG/ML IJ SOLN
4.0000 mg | Freq: Once | INTRAMUSCULAR | Status: AC
  Administered 2012-09-25: 4 mg via INTRAVENOUS

## 2012-09-25 MED ORDER — OXYCODONE-ACETAMINOPHEN 5-325 MG PO TABS: 1.0000 | ORAL_TABLET | Freq: Four times a day (QID) | ORAL | Status: DC | PRN

## 2012-09-25 NOTE — ED Provider Notes (Signed)
TIME SEEN: 7:52 AM  CHIEF COMPLAINT: Left shoulder pain  HPI: Patient is a 67 year old white female with a history of hypertension, COPD, CAD who presents the emergency department after a mechanical fall this morning. Patient reports that she tripped over her laundry basket and landed on her left arm. She denied her head. She is complaining of left shoulder pain and upper thoracic pain. No numbness, tingling or focal weakness. Patient is right-hand dominant. She states she has had prior left shoulder surgery. No history of dislocation.  ROS: See HPI Constitutional: no fever  Eyes: no drainage  ENT: no runny nose   Cardiovascular:  no chest pain  Resp: no SOB  GI: no vomiting GU: no dysuria Integumentary: no rash  Allergy: no hives  Musculoskeletal: no leg swelling  Neurological: no slurred speech ROS otherwise negative  PAST MEDICAL HISTORY/PAST SURGICAL HISTORY:  Past Medical History  Diagnosis Date  . Asthma   . Hypertension   . Arthritis   . GERD (gastroesophageal reflux disease)   . Diverticulosis   . Anemia   . CAD (coronary artery disease)   . Fatty liver disease, nonalcoholic   . Internal hemorrhoids   . Gastritis   . Hyperplastic colon polyp   . Sleep apnea   . ICD (implantable cardiac defibrillator) in place   . Shortness of breath   . IBS (irritable bowel syndrome)   . Anxiety   . CHF (congestive heart failure)   . H/O hiatal hernia   . Obesity     MEDICATIONS:  Prior to Admission medications   Medication Sig Start Date End Date Taking? Authorizing Provider  acetaminophen (TYLENOL) 325 MG tablet Take 2 tablets (650 mg total) by mouth every 4 (four) hours as needed for pain or fever. 07/24/12   Erlene Quan, PA-C  albuterol (PROVENTIL HFA;VENTOLIN HFA) 108 (90 BASE) MCG/ACT inhaler Inhale 2 puffs into the lungs every 6 (six) hours as needed. For shortness of breath    Historical Provider, MD  amLODipine (NORVASC) 10 MG tablet Take 1 tablet (10 mg total) by  mouth daily. 07/24/12   Erlene Quan, PA-C  aspirin EC 81 MG tablet Take 81 mg by mouth daily.    Historical Provider, MD  budesonide-formoterol (SYMBICORT) 160-4.5 MCG/ACT inhaler Inhale 2 puffs into the lungs 2 (two) times daily.    Historical Provider, MD  busPIRone (BUSPAR) 5 MG tablet Take 5 mg by mouth 2 (two) times daily as needed (for nerves).     Historical Provider, MD  cetirizine (ZYRTEC) 10 MG tablet Take 10 mg by mouth daily as needed for allergies.    Historical Provider, MD  docusate sodium (COLACE) 100 MG capsule Take 100 mg by mouth at bedtime. constipation    Historical Provider, MD  fluticasone (FLONASE) 50 MCG/ACT nasal spray Place 2 sprays into the nose daily as needed for rhinitis or allergies.     Historical Provider, MD  furosemide (LASIX) 40 MG tablet Take 40 mg by mouth 2 (two) times daily.      Historical Provider, MD  guaifenesin (HUMIBID E) 400 MG TABS Take 400 mg by mouth at bedtime as needed (congestion).    Historical Provider, MD  hydrALAZINE (APRESOLINE) 25 MG tablet Take 1 tablet (25 mg total) by mouth 3 (three) times daily. 07/24/12   Erlene Quan, PA-C  Ipratropium-Albuterol (COMBIVENT RESPIMAT) 20-100 MCG/ACT AERS respimat Inhale into the lungs every 6 (six) hours.    Historical Provider, MD  isosorbide mononitrate (IMDUR) 30  MG 24 hr tablet Take 1 tablet (30 mg total) by mouth daily. 07/24/12   Erlene Quan, PA-C  losartan (COZAAR) 100 MG tablet Take 100 mg by mouth 2 (two) times daily.     Historical Provider, MD  metoprolol succinate (TOPROL-XL) 50 MG 24 hr tablet Take 75 mg by mouth every morning.    Historical Provider, MD  nitroGLYCERIN (NITROSTAT) 0.4 MG SL tablet Place 1 tablet (0.4 mg total) under the tongue every 5 (five) minutes x 3 doses as needed for chest pain. 07/24/12   Erlene Quan, PA-C  omeprazole (PRILOSEC) 20 MG capsule Take 30- 60 min before your first and last meals of the day 08/25/12   Tanda Rockers, MD  omeprazole (PRILOSEC) 40 MG  capsule TAKE 1 CAPSULE BY MOUTH TWICE DAILY 08/25/12   Amy S Esterwood, PA-C  oxymetazoline (AFRIN) 0.05 % nasal spray Place 1 spray into the nose at bedtime as needed. For congestion    Historical Provider, MD  potassium chloride SA (K-DUR,KLOR-CON) 20 MEQ tablet Take 20 mEq by mouth 2 (two) times daily.      Historical Provider, MD  pravastatin (PRAVACHOL) 40 MG tablet Take 40 mg by mouth daily.    Historical Provider, MD  spironolactone (ALDACTONE) 25 MG tablet Take 12.5 mg by mouth 2 (two) times daily.      Historical Provider, MD  traMADol (ULTRAM) 50 MG tablet Take 50 mg by mouth every 6 (six) hours as needed. pain    Historical Provider, MD    ALLERGIES:  Allergies  Allergen Reactions  . Ivp Dye [Iodinated Diagnostic Agents] Shortness Of Breath  . Shellfish-Derived Products Anaphylaxis  . Sulfa Antibiotics Shortness Of Breath  . Iodine Hives    SOCIAL HISTORY:  History  Substance Use Topics  . Smoking status: Former Smoker -- 0.25 packs/day for 3 years    Types: Cigarettes    Quit date: 02/11/1968  . Smokeless tobacco: Never Used  . Alcohol Use: No    FAMILY HISTORY: Family History  Problem Relation Age of Onset  . Breast cancer Mother   . Diabetes Mother   . Heart disease Maternal Grandmother   . Kidney disease Maternal Grandmother   . Diabetes Maternal Grandmother   . Glaucoma Maternal Aunt   . Heart disease Maternal Aunt   . Asthma Sister     EXAM: BP 110/84  Pulse 70  Temp(Src) 98.5 F (36.9 C) (Oral)  Resp 18  Ht 5\' 5"  (1.651 m)  Wt 233 lb (105.688 kg)  BMI 38.77 kg/m2  SpO2 95% CONSTITUTIONAL: Alert and oriented and responds appropriately to questions. Well-appearing; well-nourished; GCS 15, appears uncomfortable, morbidly obese HEAD: Normocephalic; atraumatic EYES: Conjunctivae clear, PERRL, EOMI ENT: normal nose; no rhinorrhea; moist mucous membranes; pharynx without lesions noted; no dental injury;  no septal hematoma NECK: Supple, no meningismus,  no LAD; no midline spinal tenderness, step-off or deformity CARD: RRR; S1 and S2 appreciated; no murmurs, no clicks, no rubs, no gallops RESP: Normal chest excursion without splinting or tachypnea; breath sounds clear and equal bilaterally; no wheezes, no rhonchi, no rales; chest wall stable, nontender to palpation ABD/GI: Normal bowel sounds; non-distended; soft, non-tender, no rebound, no guarding PELVIS:  stable, nontender to palpation BACK:  The back appears normal the patient is tender to palpation over her thoracic recent diffusely, there is no CVA tenderness; no midline spinal tenderness, step-off or deformity EXT: Patient has tenderness to palpation over the proximal left humerus with painful range of  motion, neurovascularly intact distally, otherwise Normal ROM in all joints; non-tender to palpation; no edema; normal capillary refill; no cyanosis    SKIN: Normal color for age and race; warm NEURO: Moves all extremities equally except for left arm secondary to pain, sensation to light touch intact diffusely, cranial nerves II through XII intact PSYCH: The patient's mood and manner are appropriate. Grooming and personal hygiene are appropriate.  MEDICAL DECISION MAKING: Patient with mechanical fall with left arm injury and back pain. No other injury noted on exam. No head injury. No loss of consciousness. Denies any chest pain, shortness breath, palpitations or dizziness preceding the fall or currently.  ED PROGRESS: Patient has a left comminuted, mildly displaced proximal humerus fracture. X-rays of the thoracic spine are negative. Will place patient in sling and have her followup with orthopedics. Given customary in usual return precautions. Patient verbalizes understanding and comfort with plan.    [place], DO 09/25/12 1006

## 2012-09-25 NOTE — ED Notes (Signed)
Pt c/o L shoulder pain after tripping over basket 30 min ago, deformity noted.

## 2012-09-28 NOTE — Addendum Note (Signed)
Addended by: Parke Poisson E on: 09/28/2012 11:30 AM   Modules accepted: Orders

## 2012-10-05 ENCOUNTER — Telehealth: Payer: Self-pay | Admitting: Cardiovascular Disease

## 2012-10-05 IMAGING — RF DG ESOPHAGUS
16 series · 16 of 16 positions shown · non-contrast
Comparison: None.

CLINICAL DATA: Dysphagia.

ESOPHOGRAM/BARIUM SWALLOW
TECHNIQUE: Combined double contrast and single contrast
examination performed using effervescent crystals, thick barium
liquid, and thin barium liquid.
Fluoroscopy time:  3.4 minutes.

[Series 1: run · 1 of 1 slices shown (1 of 16)]
[im 1/1]
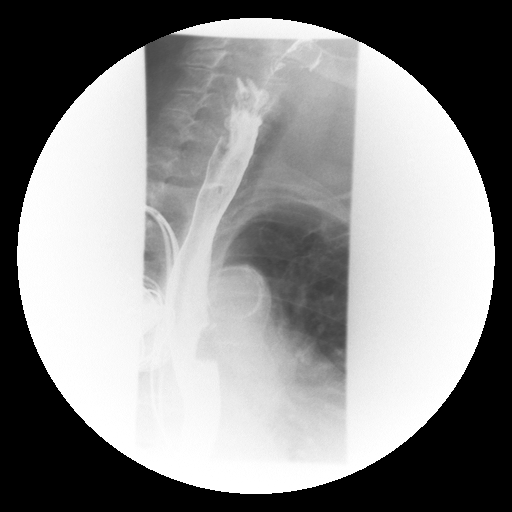

[Series 2: run · 1 of 1 slices shown (2 of 16)]
[im 1/1]
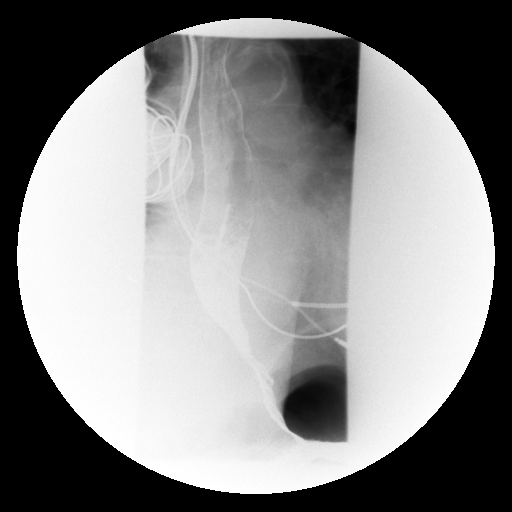

[Series 3: run · 1 of 1 slices shown (3 of 16)]
[im 1/1]
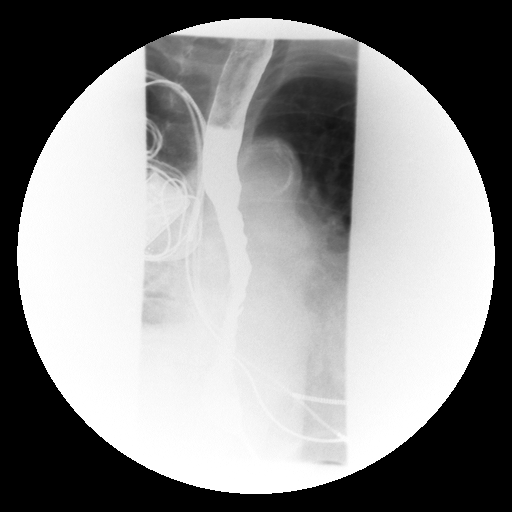

[Series 4: run · 1 of 1 slices shown (4 of 16)]
[im 1/1]
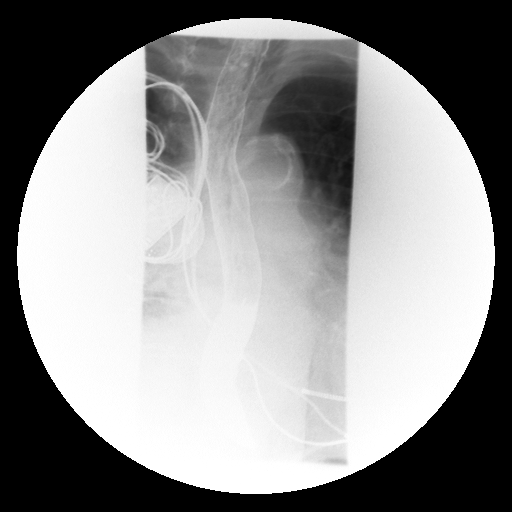

[Series 5: run · 1 of 1 slices shown (5 of 16)]
[im 1/1]
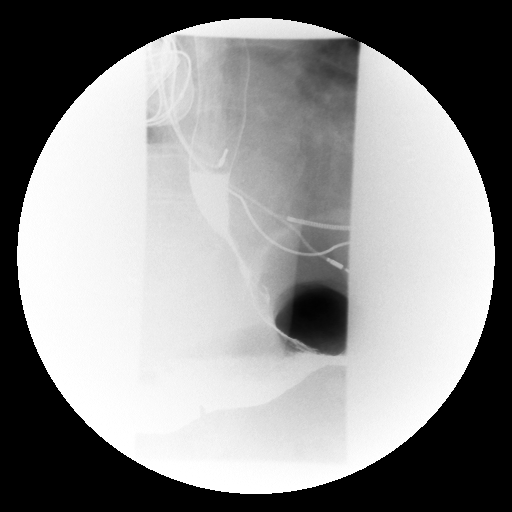

[Series 6: run · 1 of 1 slices shown (6 of 16)]
[im 1/1]
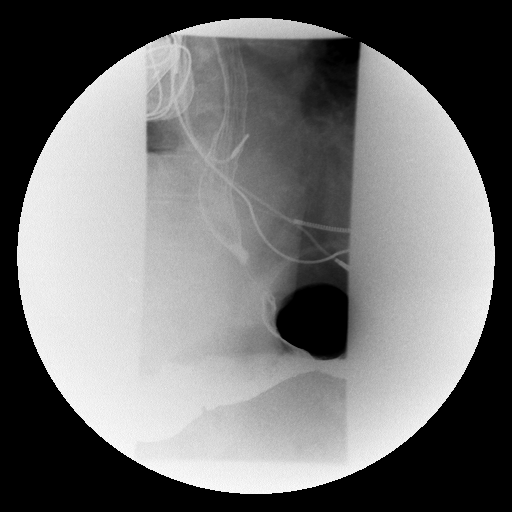

[Series 7: run · 1 of 1 slices shown (7 of 16)]
[im 1/1]
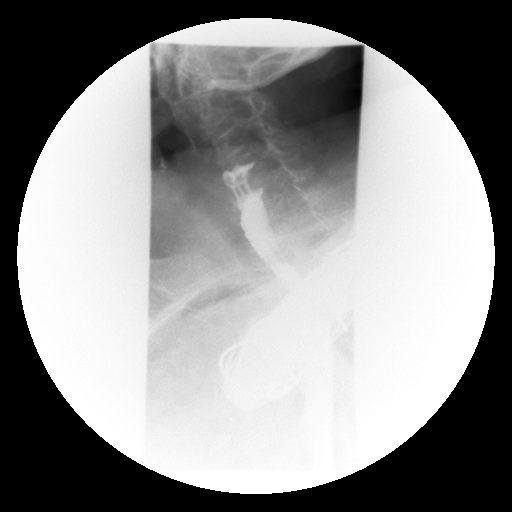

[Series 8: run · 1 of 1 slices shown (8 of 16)]
[im 1/1]
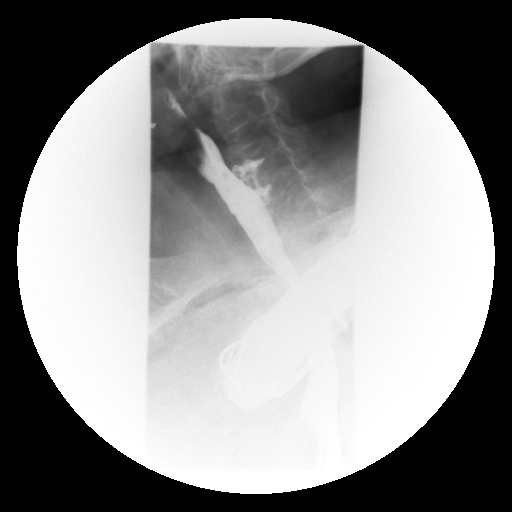

[Series 9: run · 1 of 1 slices shown (9 of 16)]
[im 1/1]
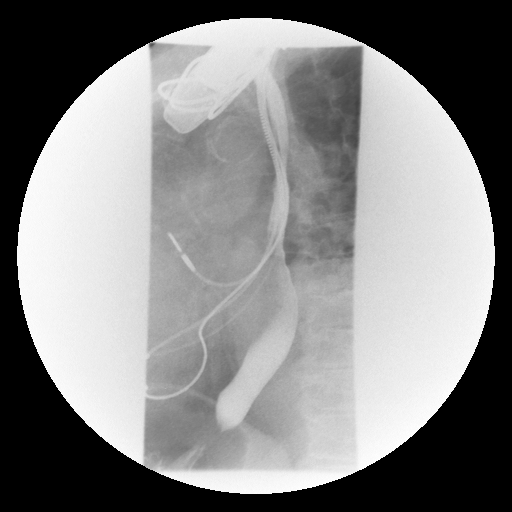

[Series 10: run · 1 of 1 slices shown (10 of 16)]
[im 1/1]
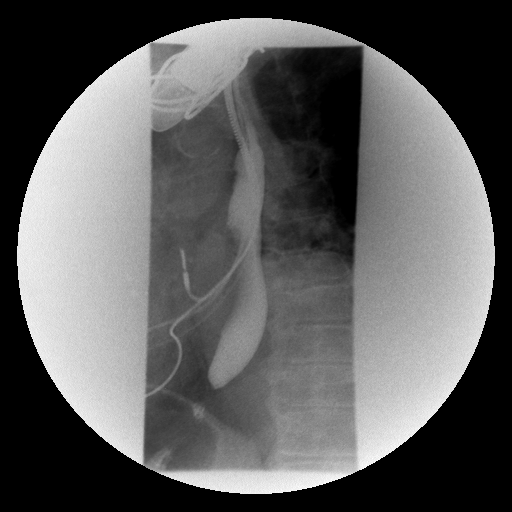

[Series 11: run · 1 of 1 slices shown (11 of 16)]
[im 1/1]
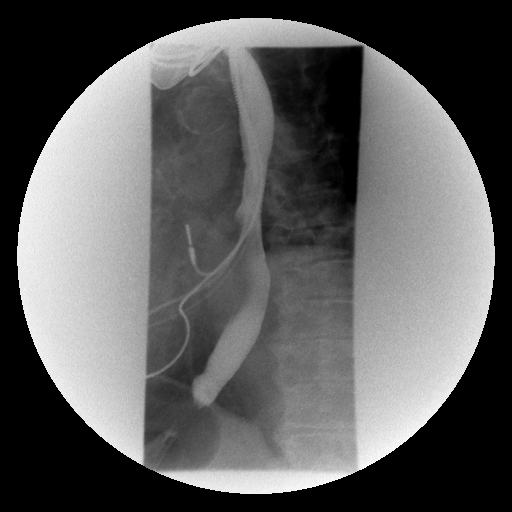

[Series 12: run · 1 of 1 slices shown (12 of 16)]
[im 1/1]
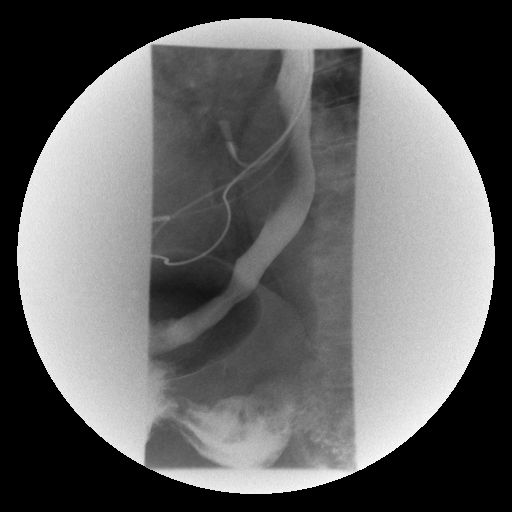

[Series 13: run · 1 of 1 slices shown (13 of 16)]
[im 1/1]
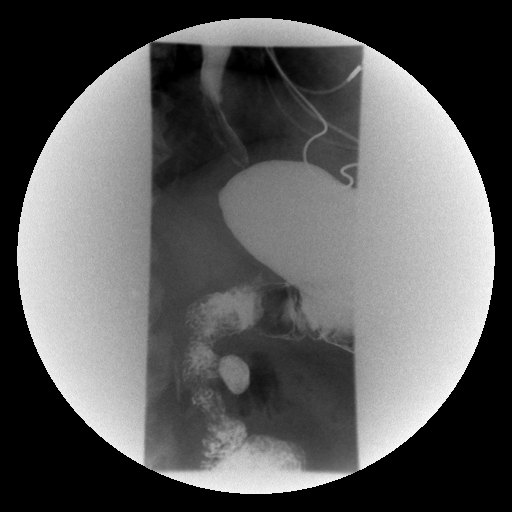

[Series 14: run · 1 of 1 slices shown (14 of 16)]
[im 1/1]
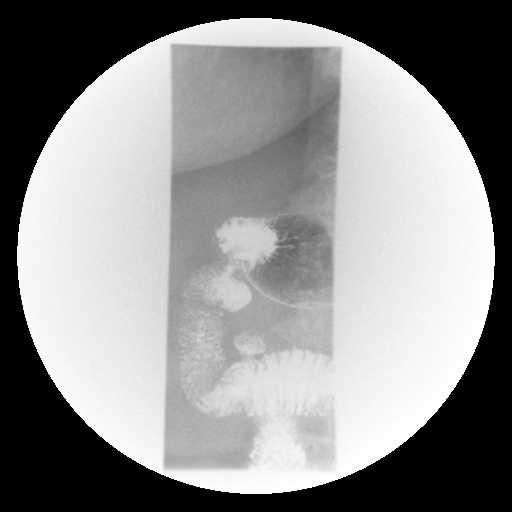

[Series 15: run · 1 of 1 slices shown (15 of 16)]
[im 1/1]
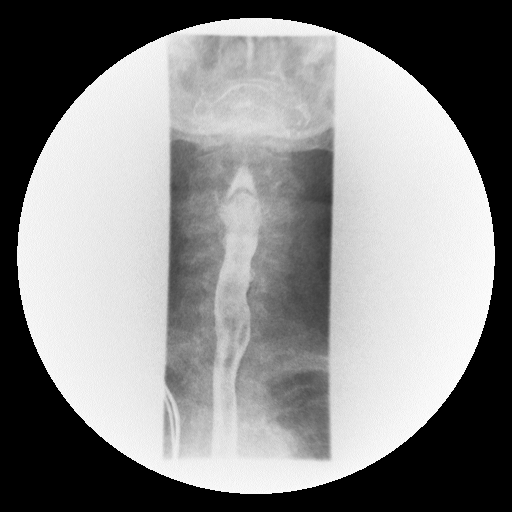

[Series 16: run · 1 of 1 slices shown (16 of 16)]
[im 1/1]
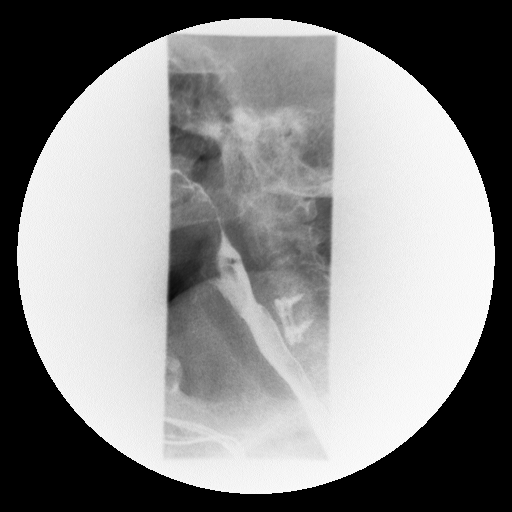

[16 of 16 positions shown; findings below may reference images not displayed]

FINDINGS: The patient swallowed barium without difficulty.  Normal
esophageal peristalsis.  No hypopharyngeal abnormalities.  The
esophagus has a normal appearance without stricture, mass or
ulceration.  No hiatal hernia or gastroesophageal reflux seen
during the examination.

There are no barium tablets available due to a manufacturing
shortage.  The patient was given a dried marshmallow coated with
barium measuring approximately 12.5 mm in diameter.  She swallowed
this without difficulty and it passed normally through the
esophagus and into the stomach.

Incidentally noted are two proximal duodenal diverticula.
IMPRESSION: Normal esophagram.

## 2012-10-05 NOTE — Telephone Encounter (Signed)
Please call-feet swelling a lot.

## 2012-10-05 NOTE — Telephone Encounter (Signed)
Returned call.  Pt stated she fell and fractured her shoulder a week ago Friday.  Stated she hasn't been taking her water pill and wanted to know if the swelling may be related to that.  Pt informed swelling is likely r/t her not taking her water pill.  Pt stated she has had trouble getting up and down b/c of the fracture and has a hard time getting her pants down to use the bathroom.  Pt advised to wear a house dress w/o pants and undergarments, if possible so she will have a better chance at making it to the bathroom in time.  Pt also advised to take water pill as directed and if no improvement in the next 24-48 hrs to call back.  Also call back for worsening symptoms, especially change in breathing.  Pt verbalized understanding and agreed w/ plan.  Stated she has had problems breathing, but "it was before all of this."

## 2012-10-06 ENCOUNTER — Ambulatory Visit: Payer: Medicare Other | Admitting: Internal Medicine

## 2012-10-08 ENCOUNTER — Ambulatory Visit: Payer: Medicare Other | Admitting: Internal Medicine

## 2012-10-14 ENCOUNTER — Other Ambulatory Visit: Payer: Self-pay | Admitting: Physician Assistant

## 2012-10-15 ENCOUNTER — Other Ambulatory Visit: Payer: Self-pay | Admitting: Physician Assistant

## 2012-10-27 ENCOUNTER — Encounter: Payer: Self-pay | Admitting: Cardiovascular Disease

## 2012-10-27 ENCOUNTER — Other Ambulatory Visit: Payer: Self-pay | Admitting: Cardiovascular Disease

## 2012-10-27 LAB — PACEMAKER DEVICE OBSERVATION

## 2012-10-27 LAB — CBC WITH DIFFERENTIAL/PLATELET
Basophils Absolute: 0.1 10*3/uL (ref 0.0–0.1)
Basophils Relative: 1 % (ref 0–1)
Eosinophils Absolute: 0.2 10*3/uL (ref 0.0–0.7)
Eosinophils Relative: 2 % (ref 0–5)
HCT: 41.4 % (ref 36.0–46.0)
Hemoglobin: 13.7 g/dL (ref 12.0–15.0)
Lymphocytes Relative: 23 % (ref 12–46)
Lymphs Abs: 1.8 10*3/uL (ref 0.7–4.0)
MCH: 28.1 pg (ref 26.0–34.0)
MCHC: 33.1 g/dL (ref 30.0–36.0)
MCV: 84.8 fL (ref 78.0–100.0)
Monocytes Absolute: 0.6 10*3/uL (ref 0.1–1.0)
Monocytes Relative: 8 % (ref 3–12)
Neutro Abs: 5.2 10*3/uL (ref 1.7–7.7)
Neutrophils Relative %: 66 % (ref 43–77)
Platelets: 226 10*3/uL (ref 150–400)
RBC: 4.88 MIL/uL (ref 3.87–5.11)
RDW: 14 % (ref 11.5–15.5)
WBC: 7.8 10*3/uL (ref 4.0–10.5)

## 2012-10-28 ENCOUNTER — Encounter (HOSPITAL_COMMUNITY): Payer: Self-pay

## 2012-10-28 LAB — PROTIME-INR
INR: 0.94 (ref <1.50)
Prothrombin Time: 12.6 seconds (ref 11.6–15.2)

## 2012-10-28 LAB — URINALYSIS, ROUTINE W REFLEX MICROSCOPIC
Bilirubin Urine: NEGATIVE
Glucose, UA: NEGATIVE mg/dL
Hgb urine dipstick: NEGATIVE
Ketones, ur: NEGATIVE mg/dL
Nitrite: NEGATIVE
Protein, ur: NEGATIVE mg/dL
Specific Gravity, Urine: 1.017 (ref 1.005–1.030)
Urobilinogen, UA: 0.2 mg/dL (ref 0.0–1.0)
pH: 6 (ref 5.0–8.0)

## 2012-10-28 LAB — TSH: TSH: 2.128 u[IU]/mL (ref 0.350–4.500)

## 2012-10-28 LAB — COMPREHENSIVE METABOLIC PANEL
ALT: 30 U/L (ref 0–35)
AST: 23 U/L (ref 0–37)
Albumin: 3.8 g/dL (ref 3.5–5.2)
Alkaline Phosphatase: 113 U/L (ref 39–117)
BUN: 18 mg/dL (ref 6–23)
CO2: 32 mEq/L (ref 19–32)
Calcium: 9.6 mg/dL (ref 8.4–10.5)
Chloride: 101 mEq/L (ref 96–112)
Creat: 0.93 mg/dL (ref 0.50–1.10)
Glucose, Bld: 114 mg/dL — ABNORMAL HIGH (ref 70–99)
Potassium: 4 mEq/L (ref 3.5–5.3)
Sodium: 140 mEq/L (ref 135–145)
Total Bilirubin: 0.4 mg/dL (ref 0.3–1.2)
Total Protein: 6.6 g/dL (ref 6.0–8.3)

## 2012-10-28 LAB — URINALYSIS, MICROSCOPIC ONLY
Casts: NONE SEEN
Crystals: NONE SEEN

## 2012-10-28 LAB — APTT: aPTT: 36 seconds (ref 24–37)

## 2012-11-01 ENCOUNTER — Other Ambulatory Visit: Payer: Self-pay | Admitting: *Deleted

## 2012-11-01 DIAGNOSIS — Z01812 Encounter for preprocedural laboratory examination: Secondary | ICD-10-CM

## 2012-11-02 ENCOUNTER — Encounter (HOSPITAL_COMMUNITY)
Admission: RE | Disposition: A | Discharge status: Home / Self Care | Payer: Self-pay | Source: Ambulatory Visit | Attending: Cardiovascular Disease

## 2012-11-02 ENCOUNTER — Ambulatory Visit (HOSPITAL_COMMUNITY)
Admission: RE | Admit: 2012-11-02 | Discharge: 2012-11-02 | Disposition: A | Discharge status: Home / Self Care | Payer: Medicare Other | Source: Ambulatory Visit | Attending: Cardiovascular Disease | Admitting: Cardiovascular Disease

## 2012-11-02 DIAGNOSIS — I442 Atrioventricular block, complete: Secondary | ICD-10-CM

## 2012-11-02 DIAGNOSIS — Z4502 Encounter for adjustment and management of automatic implantable cardiac defibrillator: Secondary | ICD-10-CM | POA: Insufficient documentation

## 2012-11-02 HISTORY — PX: BIV PACEMAKER GENERATOR CHANGE OUT: SHX5746

## 2012-11-02 LAB — SURGICAL PCR SCREEN
MRSA, PCR: NEGATIVE
Staphylococcus aureus: NEGATIVE

## 2012-11-02 SURGERY — BIV PACEMAKER GENERATOR CHANGE OUT
Anesthesia: LOCAL

## 2012-11-02 MED ORDER — HEPARIN (PORCINE) IN NACL 2-0.9 UNIT/ML-% IJ SOLN
INTRAMUSCULAR | Status: AC
  Filled 2012-11-02: qty 500

## 2012-11-02 MED ORDER — MUPIROCIN 2 % EX OINT
TOPICAL_OINTMENT | CUTANEOUS | Status: AC
  Filled 2012-11-02: qty 22

## 2012-11-02 MED ORDER — ACETAMINOPHEN 325 MG PO TABS: 325.0000 mg | ORAL_TABLET | ORAL | Status: DC | PRN

## 2012-11-02 MED ORDER — GENTAMICIN SULFATE 40 MG/ML IJ SOLN
80.0000 mg | INTRAMUSCULAR | Status: DC
  Filled 2012-11-02: qty 2

## 2012-11-02 MED ORDER — SODIUM CHLORIDE 0.9 % IV SOLN: INTRAVENOUS | Status: DC

## 2012-11-02 MED ORDER — ONDANSETRON HCL 4 MG/2ML IJ SOLN: 4.0000 mg | Freq: Four times a day (QID) | INTRAMUSCULAR | Status: DC | PRN

## 2012-11-02 MED ORDER — MUPIROCIN 2 % EX OINT
TOPICAL_OINTMENT | Freq: Two times a day (BID) | CUTANEOUS | Status: DC
  Administered 2012-11-02: 1 via NASAL
  Filled 2012-11-02: qty 22

## 2012-11-02 MED ORDER — SODIUM CHLORIDE 0.9 % IV SOLN
INTRAVENOUS | Status: DC
  Administered 2012-11-02: 10:00:00 via INTRAVENOUS

## 2012-11-02 MED ORDER — LIDOCAINE HCL (PF) 1 % IJ SOLN
INTRAMUSCULAR | Status: AC
  Filled 2012-11-02: qty 60

## 2012-11-02 MED ORDER — MIDAZOLAM HCL 2 MG/2ML IJ SOLN
INTRAMUSCULAR | Status: AC
  Filled 2012-11-02: qty 2

## 2012-11-02 MED ORDER — CHLORHEXIDINE GLUCONATE 4 % EX LIQD
60.0000 mL | Freq: Once | CUTANEOUS | Status: DC
  Filled 2012-11-02: qty 60

## 2012-11-02 MED ORDER — CEFAZOLIN SODIUM-DEXTROSE 2-3 GM-% IV SOLR
2.0000 g | INTRAVENOUS | Status: DC
  Filled 2012-11-02: qty 50

## 2012-11-02 MED ORDER — HYDROCODONE-ACETAMINOPHEN 5-325 MG PO TABS: 1.0000 | ORAL_TABLET | ORAL | Status: DC | PRN

## 2012-11-02 NOTE — Op Note (Signed)
Procedure report  Procedure performed:  1. CRT-P generator changeout (downgrade from CRT-D) 2. Light sedation  Reason for procedure:  1. Device generator at elective replacement interval  Procedure performed by:  Sanda Klein, MD  Complications:  None  Estimated blood loss:  <5 mL  Medications administered during procedure:  Vancomycin 1 g intravenously, lidocaine 1% 30 mL locally, fentanyl 25 mcg intravenously, Versed 1 mg intravenously Device details:   [employer] model number V273, serial number [id number]  Right atrial lead (chronic) Guidant Selute , model number [id number], serial number [id number] (implanted 05/28/1999) Right ventricular lead (chronic)  [employer] Durata IS-1 dual coil, model number 7120, serial number [id number] (implanted 11/03/2006) Left ventricular lead  Guidant Acuity, model number R235263, serial number [id number] (implanted 11/03/2006) Explanted generator [employer], serial number  [id number] (implanted 11/03/2006)  Procedure details:  After the risks and benefits of the procedure were discussed the patient provided informed consent. She was brought to the cardiac catheter lab in the fasting state. The patient was prepped and draped in usual sterile fashion. Local anesthesia with 1% lidocaine was administered to to the left infraclavicular area. A 5-6cm horizontal incision was made parallel with and 2-3 cm caudal to the right clavicle, in the area of an old scar. An older scar was seen closer to the left clavicle. Using minimal electrocautery and mostly sharp and blunt dissection the prepectoral pocket was opened carefully to avoid injury to the loops of chronic leads. Extensive dissection was not necessary. The device was explanted. The pocket was carefully inspected for hemostasis and flushed with copious amounts of antibiotic solution.  The leads were disconnected from the old generator and testing of the lead parameters later showed excellent values.  The new generator was connected to the chronic leads, with appropriate pacing noted.   The entire system was then carefully inserted in the pocket with care been taking that the leads and device assumed a comfortable position without pressure on the incision. Great care was taken that the leads be located deep to the generator. The pocket was then closed in layers using 2 layers of 2-0 Vicryl and cutaneous staples after which a sterile dressing was applied.   At the end of the procedure the following lead parameters were encountered:   Right atrial lead sensed P waves 4.1 mV, impedance 423 ohms, threshold 0.8@0 .4 ms pulse width.  Right ventricular lead sensed R waves  16.4 mV, impedance 805 ohms, threshold 0.8 at 0.4 ms pulse width.  Left ventricular lead (LV tip to LV ring) sensed R waves  14.1 mV, impedance 1414 ohms, threshold 1.8 at 0.4 ms pulse width.  Sanda Klein, MD, [place] [phone] office [phone] pager

## 2012-11-02 NOTE — H&P (Signed)
  Date of Initial H&P: 10/27/2012 (Dr. Rollene Fare)  History reviewed, patient examined, no change in status, stable for surgery. She is agreeable to proceed with removal of her ICD (at [facility]) and implant of a BiV pacemaker. Her EF has improved essentially to normal, there is no overt CHF, and she has never had an ICD shock. She does require BiV pacing. Pacemaker dependent due to CHB. This procedure has been fully reviewed with the patient and written informed consent has been obtained.  Alicia Klein, MD, [place] [phone] office [phone] pager

## 2012-11-04 ENCOUNTER — Encounter: Payer: Self-pay | Admitting: Cardiovascular Disease

## 2012-11-12 ENCOUNTER — Encounter: Payer: Self-pay | Admitting: Cardiovascular Disease

## 2012-11-12 ENCOUNTER — Ambulatory Visit (INDEPENDENT_AMBULATORY_CARE_PROVIDER_SITE_OTHER): Payer: Medicare Other | Admitting: Cardiovascular Disease

## 2012-11-12 VITALS — BP 120/80 | HR 69 | Resp 16 | Ht 65.0 in | Wt 240.0 lb

## 2012-11-12 DIAGNOSIS — I2510 Atherosclerotic heart disease of native coronary artery without angina pectoris: Secondary | ICD-10-CM

## 2012-11-12 NOTE — Patient Instructions (Addendum)
Please call after your Latitude transmitter has been transmitted for the first time.   Follow up in 6 months.

## 2012-11-12 NOTE — Progress Notes (Signed)
Patient ID: [name], female   DOB: [birth year], 67 y.o.   MRN: [id number]  Mrs. Barsness presented today for wound check following a CRT pacemaker generator change. The site appears healthy. Staples are removed. There is no redness, warmth, swelling or discharge. The skin is almost completely healed. She was educated regarding remote pacemaker monitoring and will return for an office visit in 6 months.

## 2012-11-15 ENCOUNTER — Encounter: Payer: Self-pay | Admitting: Cardiovascular Disease

## 2012-11-17 IMAGING — CR DG CHEST 1V PORT
1 series · 1 of 1 positions shown · non-contrast
Comparison: [DATE].

CLINICAL DATA: Chest pain.  Weakness.  Short of breath.

PORTABLE CHEST - 1 VIEW

[AP]
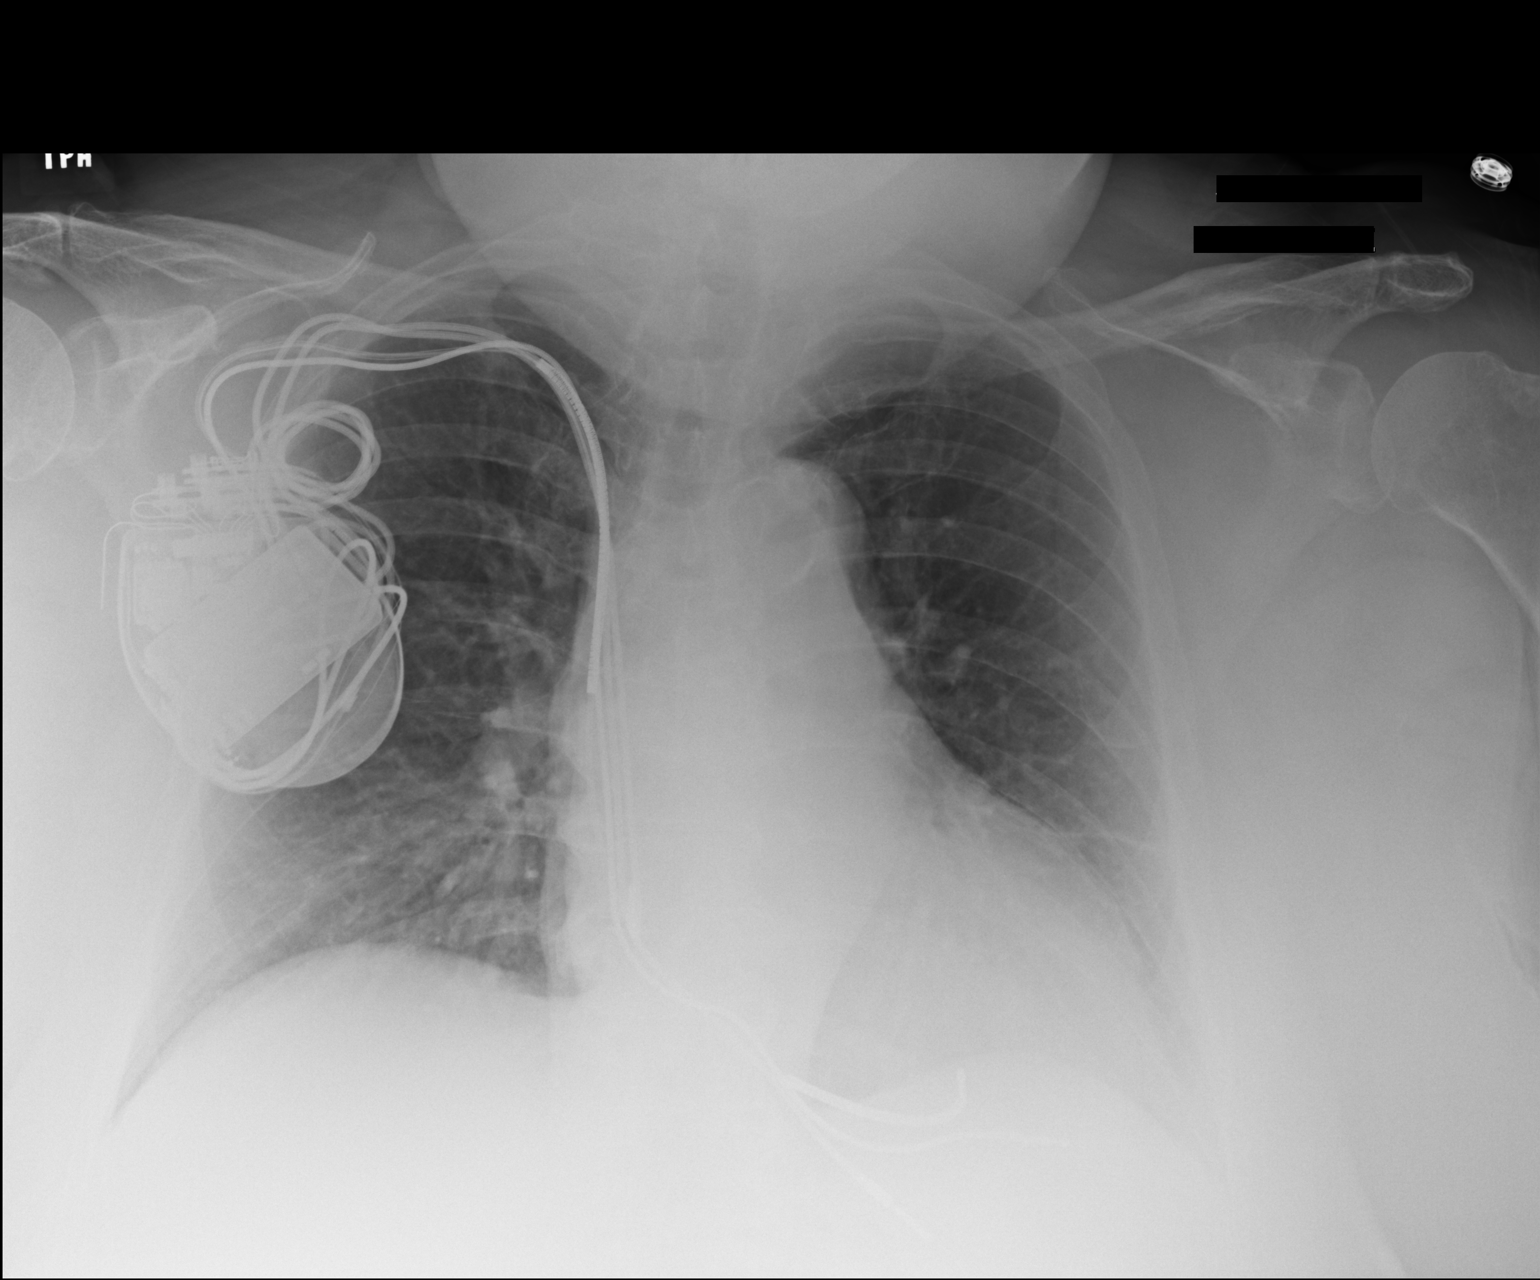

[1 of 1 positions shown; findings below may reference images not displayed]

FINDINGS: Cardiopericardial silhouette within normal limits for
projection. Right subclavian AICD appears similar to prior.  No
airspace disease.  No effusion.  Linear scarring or subsegmental
atelectasis present in the left midlung.  Aortic arch
atherosclerosis.
IMPRESSION: No acute cardiopulmonary disease.

## 2012-11-18 IMAGING — CT CT ANGIO CHEST
3 of 7 series · 19 of 46 positions shown · IV contrast (APPLIED)
Comparison: 02/07/2006

CLINICAL DATA: Chest pain and back pain

CT ANGIOGRAPHY CHEST WITH CONTRAST
TECHNIQUE: Multidetector CT imaging of the chest was performed
using the standard protocol during bolus administration of
intravenous contrast.  Multiplanar CT image reconstructions
including MIPs were obtained to evaluate the vascular anatomy.
Contrast: 80mL OMNIPAQUE IOHEXOL 350 MG/ML SOLN

[Series 4: dissection 2.0 st · axial · 0.73mm/px · z∈[-250,-2]mm · 13 of 146 slices shown]
[im 11/146  lung]
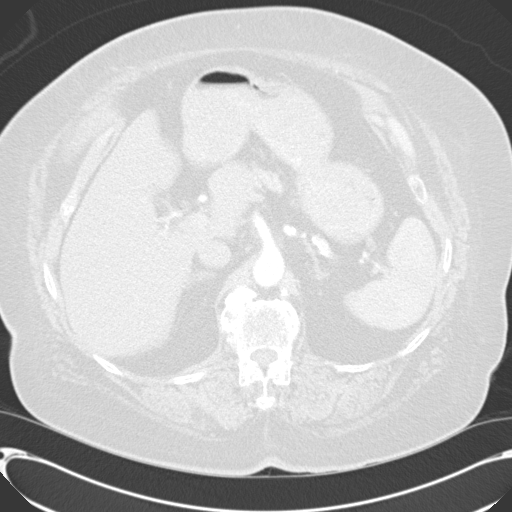
[im 21/146  soft-tissue]
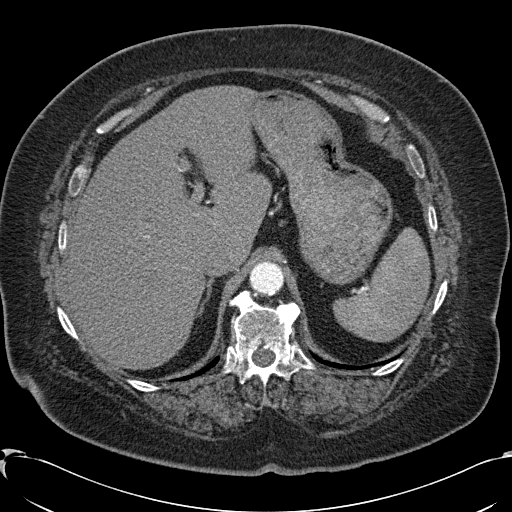
[im 32/146  lung]
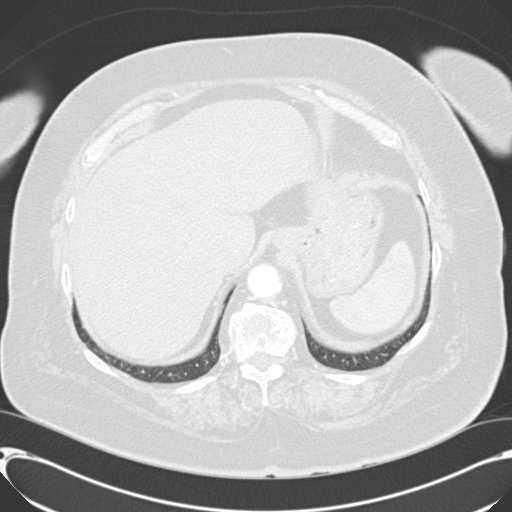
[im 42/146  soft-tissue]
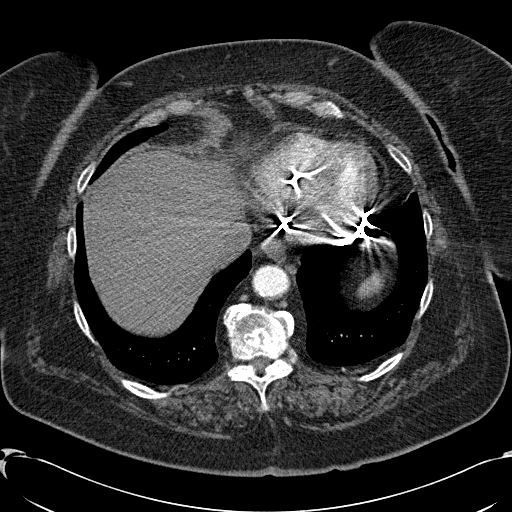
[im 52/146  lung]
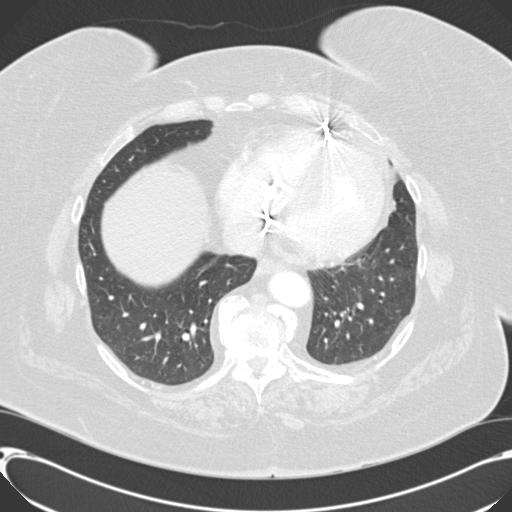
[im 63/146  soft-tissue]
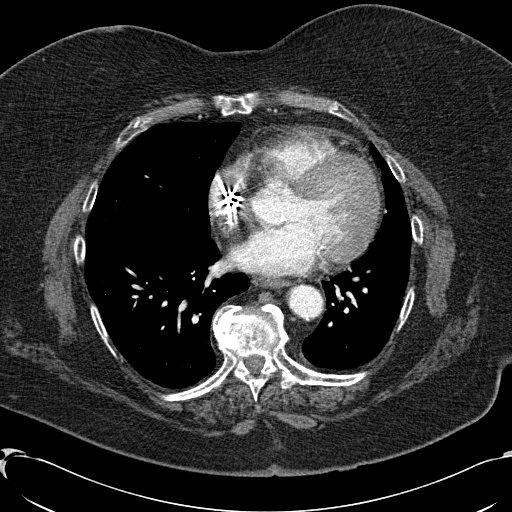
[im 73/146  lung]
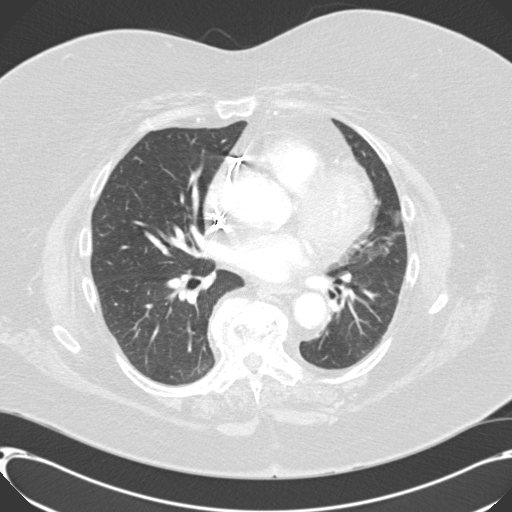
[im 83/146  soft-tissue]
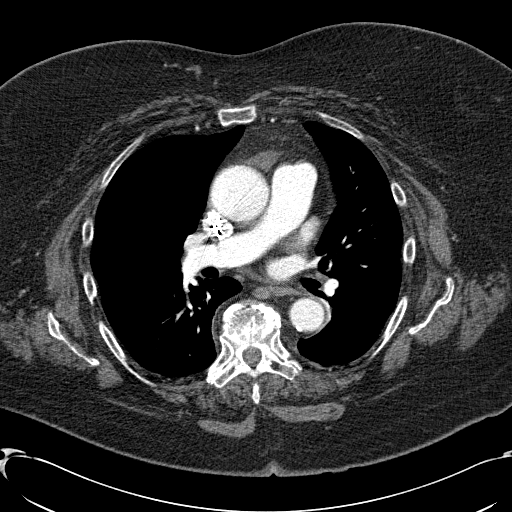
[im 94/146  lung]
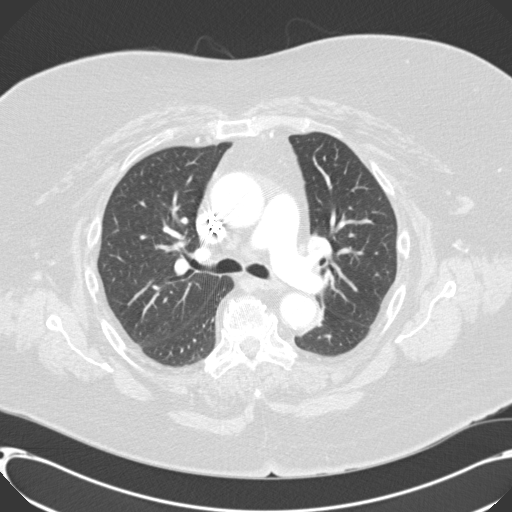
[im 104/146  soft-tissue]
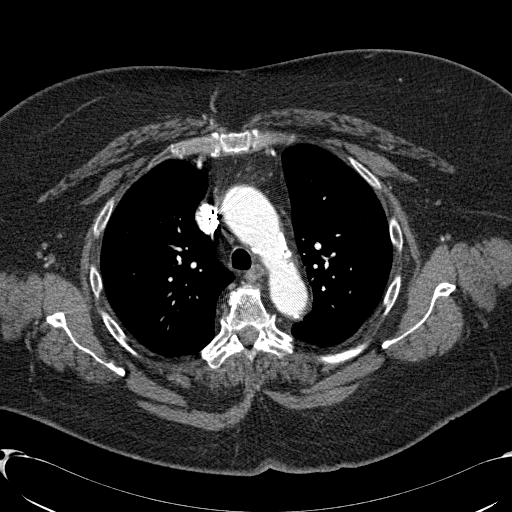
[im 114/146  lung]
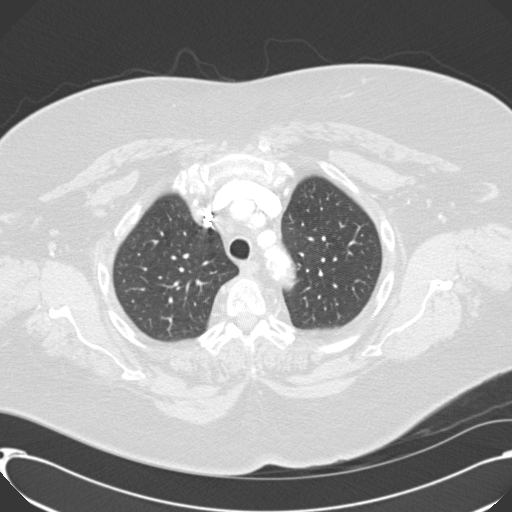
[im 125/146  soft-tissue]
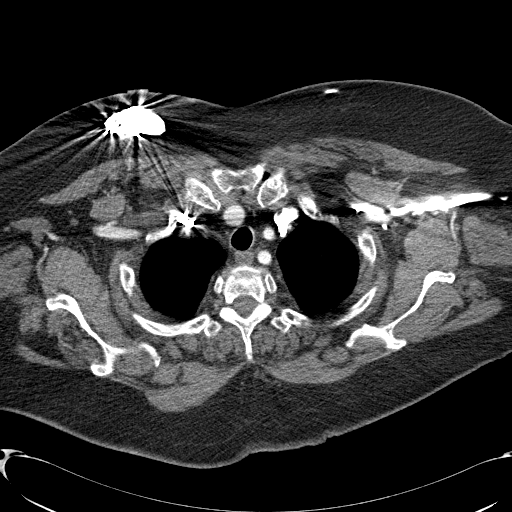
[im 135/146  lung]
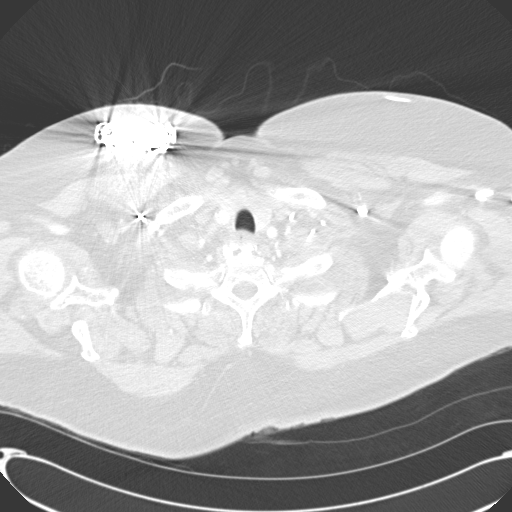

[Series 6: dissection 2.0 lung · axial · 0.73mm/px · z∈[-208,-140]mm · 3 of 126 slices shown]
[im 12/126  soft-tissue]
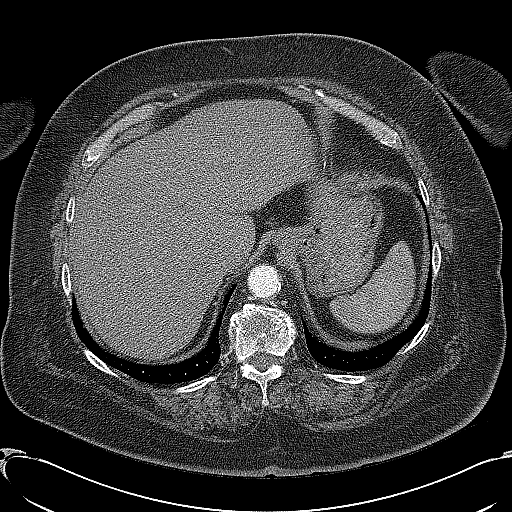
[im 23/126  soft-tissue]
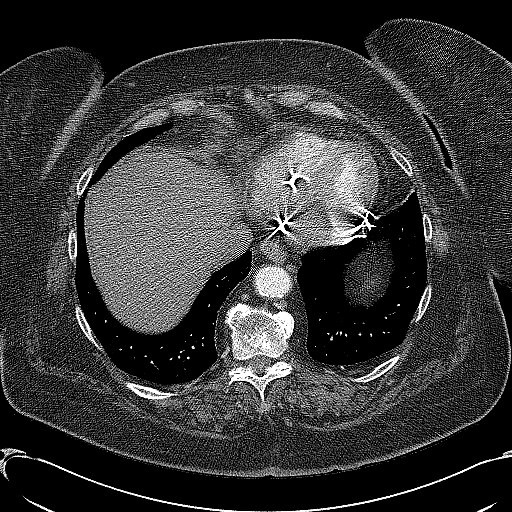
[im 46/126  soft-tissue]
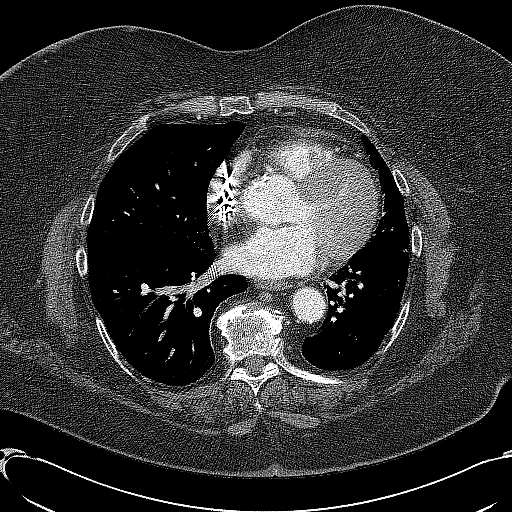

[Series 603: coronals · coronal · 0.73mm/px · 3 of 138 slices shown]
[im 35/138  soft-tissue]
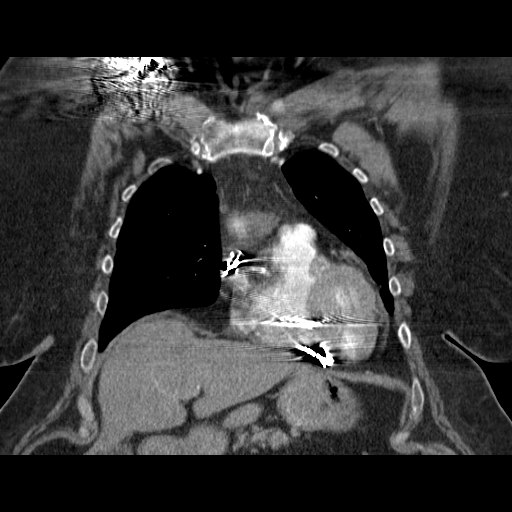
[im 69/138  soft-tissue]
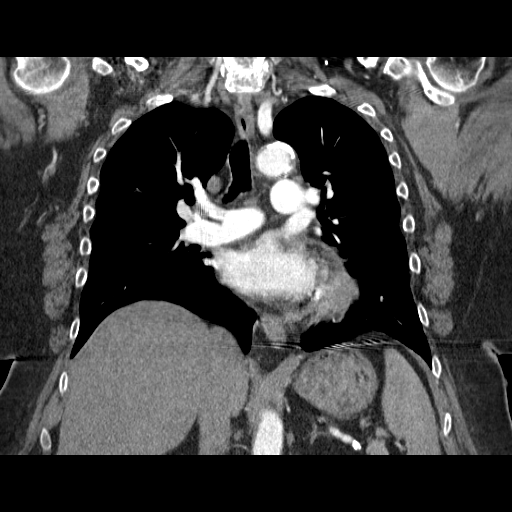
[im 103/138  soft-tissue]
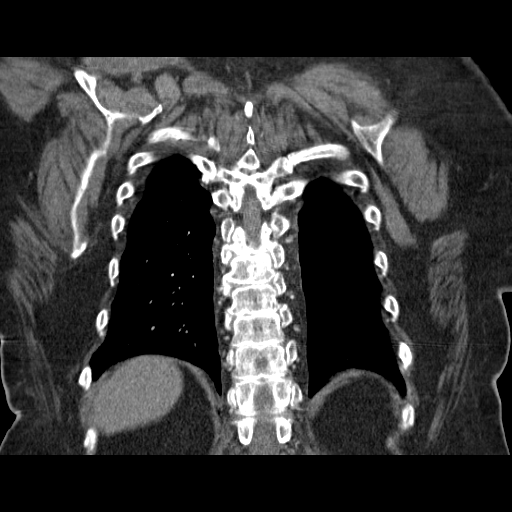

[19 of 46 positions shown; findings below may reference images not displayed]

FINDINGS: Precontrast images demonstrate no evidence of intramural
hematoma.

Post contrast images demonstrate no evidence of aortic dissection
or transection.  Maximal caliber of the ascending aorta is 3.9.
This is not significantly changed.  Atherosclerotic vascular
calcifications of the aorta and great vessels are noted.
Innominate artery, left common carotid artery, left subclavian
artery are patent.  Left vertebral artery is diminutive.  Right
vertebral artery is grossly patent.

Moderate aortic valve calcifications.  Calcifications of the left
main coronary, LAD coronary, circumflex coronary artery, and
proximal right coronary artery are present.

AICD device in place within the right atrium and right ventricle.
Coronary sinus lead is in place.  Dense mitral annular
calcifications.

No obvious filling defect in the pulmonary arterial tree to suggest
acute pulmonary thromboembolism.

L1 compression fracture has developed since prior chest CT with 20%
loss of height anteriorly.  No obvious acute fracture lines and
this has a chronic appearance.  Minimal retropulsion of the
inferior endplate.

Lungs are clear.

Review of the MIP images confirms the above findings.
IMPRESSION: No evidence of aortic dissection, intramural hematoma, or pulmonary
thromboembolism.

Atherosclerotic faster calcifications are noted.  Aortic valve
calcifications are present.

L1 compression deformity has a chronic appearance.

## 2012-11-24 ENCOUNTER — Emergency Department (HOSPITAL_COMMUNITY)
Admission: EM | Admit: 2012-11-24 | Discharge: 2012-11-25 | Disposition: A | Discharge status: Home / Self Care | Payer: Medicare Other | Attending: Emergency Medicine | Admitting: Emergency Medicine

## 2012-11-24 ENCOUNTER — Emergency Department (HOSPITAL_COMMUNITY): Payer: Medicare Other

## 2012-11-24 ENCOUNTER — Encounter (HOSPITAL_COMMUNITY): Payer: Self-pay | Admitting: Emergency Medicine

## 2012-11-24 DIAGNOSIS — Z95810 Presence of automatic (implantable) cardiac defibrillator: Secondary | ICD-10-CM | POA: Insufficient documentation

## 2012-11-24 DIAGNOSIS — Z862 Personal history of diseases of the blood and blood-forming organs and certain disorders involving the immune mechanism: Secondary | ICD-10-CM | POA: Insufficient documentation

## 2012-11-24 DIAGNOSIS — I2510 Atherosclerotic heart disease of native coronary artery without angina pectoris: Secondary | ICD-10-CM | POA: Insufficient documentation

## 2012-11-24 DIAGNOSIS — IMO0002 Reserved for concepts with insufficient information to code with codable children: Secondary | ICD-10-CM | POA: Insufficient documentation

## 2012-11-24 DIAGNOSIS — J069 Acute upper respiratory infection, unspecified: Secondary | ICD-10-CM

## 2012-11-24 DIAGNOSIS — K219 Gastro-esophageal reflux disease without esophagitis: Secondary | ICD-10-CM | POA: Insufficient documentation

## 2012-11-24 DIAGNOSIS — F411 Generalized anxiety disorder: Secondary | ICD-10-CM | POA: Insufficient documentation

## 2012-11-24 DIAGNOSIS — Z860100 Personal history of colon polyps, unspecified: Secondary | ICD-10-CM | POA: Insufficient documentation

## 2012-11-24 DIAGNOSIS — Z87891 Personal history of nicotine dependence: Secondary | ICD-10-CM | POA: Insufficient documentation

## 2012-11-24 DIAGNOSIS — J45909 Unspecified asthma, uncomplicated: Secondary | ICD-10-CM | POA: Insufficient documentation

## 2012-11-24 DIAGNOSIS — S335XXA Sprain of ligaments of lumbar spine, initial encounter: Secondary | ICD-10-CM | POA: Insufficient documentation

## 2012-11-24 DIAGNOSIS — Y939 Activity, unspecified: Secondary | ICD-10-CM | POA: Insufficient documentation

## 2012-11-24 DIAGNOSIS — I10 Essential (primary) hypertension: Secondary | ICD-10-CM | POA: Insufficient documentation

## 2012-11-24 DIAGNOSIS — S39012A Strain of muscle, fascia and tendon of lower back, initial encounter: Secondary | ICD-10-CM

## 2012-11-24 DIAGNOSIS — E669 Obesity, unspecified: Secondary | ICD-10-CM | POA: Insufficient documentation

## 2012-11-24 DIAGNOSIS — M129 Arthropathy, unspecified: Secondary | ICD-10-CM | POA: Insufficient documentation

## 2012-11-24 DIAGNOSIS — Z7982 Long term (current) use of aspirin: Secondary | ICD-10-CM | POA: Insufficient documentation

## 2012-11-24 DIAGNOSIS — I509 Heart failure, unspecified: Secondary | ICD-10-CM | POA: Insufficient documentation

## 2012-11-24 DIAGNOSIS — Z79899 Other long term (current) drug therapy: Secondary | ICD-10-CM | POA: Insufficient documentation

## 2012-11-24 DIAGNOSIS — X58XXXA Exposure to other specified factors, initial encounter: Secondary | ICD-10-CM | POA: Insufficient documentation

## 2012-11-24 DIAGNOSIS — Y929 Unspecified place or not applicable: Secondary | ICD-10-CM | POA: Insufficient documentation

## 2012-11-24 LAB — URINALYSIS, ROUTINE W REFLEX MICROSCOPIC
Bilirubin Urine: NEGATIVE
Glucose, UA: NEGATIVE mg/dL
Hgb urine dipstick: NEGATIVE
Ketones, ur: NEGATIVE mg/dL
Leukocytes, UA: NEGATIVE
Nitrite: NEGATIVE
Protein, ur: NEGATIVE mg/dL
Specific Gravity, Urine: 1.017 (ref 1.005–1.030)
Urobilinogen, UA: 0.2 mg/dL (ref 0.0–1.0)
pH: 5.5 (ref 5.0–8.0)

## 2012-11-24 LAB — COMPREHENSIVE METABOLIC PANEL
ALT: 20 U/L (ref 0–35)
AST: 24 U/L (ref 0–37)
Albumin: 3.3 g/dL — ABNORMAL LOW (ref 3.5–5.2)
Alkaline Phosphatase: 97 U/L (ref 39–117)
BUN: 14 mg/dL (ref 6–23)
CO2: 23 mEq/L (ref 19–32)
Calcium: 9.3 mg/dL (ref 8.4–10.5)
Chloride: 104 mEq/L (ref 96–112)
Creatinine, Ser: 0.82 mg/dL (ref 0.50–1.10)
GFR calc Af Amer: 83 mL/min — ABNORMAL LOW (ref >90)
GFR calc non Af Amer: 72 mL/min — ABNORMAL LOW (ref >90)
Glucose, Bld: 101 mg/dL — ABNORMAL HIGH (ref 70–99)
Potassium: 4.3 mEq/L (ref 3.5–5.1)
Sodium: 138 mEq/L (ref 135–145)
Total Bilirubin: 0.3 mg/dL (ref 0.3–1.2)
Total Protein: 7.1 g/dL (ref 6.0–8.3)

## 2012-11-24 LAB — CBC
HCT: 42.8 % (ref 36.0–46.0)
Hemoglobin: 13.7 g/dL (ref 12.0–15.0)
MCH: 28 pg (ref 26.0–34.0)
MCHC: 32 g/dL (ref 30.0–36.0)
MCV: 87.5 fL (ref 78.0–100.0)
Platelets: 199 10*3/uL (ref 150–400)
RBC: 4.89 MIL/uL (ref 3.87–5.11)
RDW: 13.6 % (ref 11.5–15.5)
WBC: 7.9 10*3/uL (ref 4.0–10.5)

## 2012-11-24 LAB — POCT I-STAT TROPONIN I: Troponin i, poc: 0 ng/mL (ref 0.00–0.08)

## 2012-11-24 MED ORDER — ONDANSETRON HCL 4 MG/2ML IJ SOLN
4.0000 mg | Freq: Once | INTRAMUSCULAR | Status: AC
  Administered 2012-11-24: 4 mg via INTRAVENOUS

## 2012-11-24 MED ORDER — ONDANSETRON HCL 4 MG/2ML IJ SOLN
INTRAMUSCULAR | Status: AC
  Administered 2012-11-24: 4 mg via INTRAVENOUS
  Filled 2012-11-24: qty 2

## 2012-11-24 MED ORDER — NITROGLYCERIN 0.4 MG SL SUBL
0.4000 mg | SUBLINGUAL_TABLET | Freq: Once | SUBLINGUAL | Status: AC
  Administered 2012-11-24: 0.4 mg via SUBLINGUAL

## 2012-11-24 MED ORDER — ALBUTEROL SULFATE (5 MG/ML) 0.5% IN NEBU
5.0000 mg | INHALATION_SOLUTION | Freq: Once | RESPIRATORY_TRACT | Status: AC
  Administered 2012-11-24: 5 mg via RESPIRATORY_TRACT
  Filled 2012-11-24: qty 1

## 2012-11-24 MED ORDER — MORPHINE SULFATE 4 MG/ML IJ SOLN: 4.0000 mg | Freq: Once | INTRAMUSCULAR | Status: DC

## 2012-11-24 MED ORDER — FUROSEMIDE 10 MG/ML IJ SOLN: 40.0000 mg | Freq: Once | INTRAMUSCULAR | Status: DC

## 2012-11-24 MED ORDER — PIPERACILLIN-TAZOBACTAM 3.375 G IVPB: 3.3750 g | Freq: Once | INTRAVENOUS | Status: DC

## 2012-11-24 NOTE — ED Provider Notes (Signed)
9: 32pm - Pt seen originally by Dr. Regenia Skeeter and Elisha Headland, FNP in the ED after being sent to the ED by her PCP for fever, chest pain, left shoulder pain and right low back pain.  Pt waiting to see cardiology and I was not given report, however, cardiology [facility]) has refused to see patient. He Dr. Candace Gallus says that she has a normal cardiac cath, fever, cough, and right low back pain and although she has a cardiac history, this does not appear to be a cardiac visit.  I spoke with my attending on Pod B/D, Dr. Tanna Furry about the change in plan. Pt currently has had non acute cbc, bmp, trop and chest xray. Will look into fever for possible pyelo and give albuterol treatment, she says she has asthma and a cough and her chest feels tight. I have made my attending aware of the change in plans.  I evaluated patient and she tells me that she has had fever the past 3 nights with cough and chest tightness. She also has had right flank pain without any nausea, vomiting, diarrhea or dysuria. She is having left shoulder pain but admits she broke her shoulder 4-6 weeks ago and that it has been sore ever since. She admit that this pain does feel different.   Results for orders placed during the hospital encounter of 11/24/12  CBC      Result Value Range   WBC 7.9  4.0 - 10.5 K/uL   RBC 4.89  3.87 - 5.11 MIL/uL   Hemoglobin 13.7  12.0 - 15.0 g/dL   HCT 42.8  36.0 - 46.0 %   MCV 87.5  78.0 - 100.0 fL   MCH 28.0  26.0 - 34.0 pg   MCHC 32.0  30.0 - 36.0 g/dL   RDW 13.6  11.5 - 15.5 %   Platelets 199  150 - 400 K/uL  COMPREHENSIVE METABOLIC PANEL      Result Value Range   Sodium 138  135 - 145 mEq/L   Potassium 4.3  3.5 - 5.1 mEq/L   Chloride 104  96 - 112 mEq/L   CO2 23  19 - 32 mEq/L   Glucose, Bld 101 (*) 70 - 99 mg/dL   BUN 14  6 - 23 mg/dL   Creatinine, Ser 0.82  0.50 - 1.10 mg/dL   Calcium 9.3  8.4 - 10.5 mg/dL   Total Protein 7.1  6.0 - 8.3 g/dL   Albumin 3.3 (*) 3.5 - 5.2 g/dL   AST 24   0 - 37 U/L   ALT 20  0 - 35 U/L   Alkaline Phosphatase 97  39 - 117 U/L   Total Bilirubin 0.3  0.3 - 1.2 mg/dL   GFR calc non Af Amer 72 (*) >90 mL/min   GFR calc Af Amer 83 (*) >90 mL/min  URINALYSIS, ROUTINE W REFLEX MICROSCOPIC      Result Value Range   Color, Urine YELLOW  YELLOW   APPearance CLEAR  CLEAR   Specific Gravity, Urine 1.017  1.005 - 1.030   pH 5.5  5.0 - 8.0   Glucose, UA NEGATIVE  NEGATIVE mg/dL   Hgb urine dipstick NEGATIVE  NEGATIVE   Bilirubin Urine NEGATIVE  NEGATIVE   Ketones, ur NEGATIVE  NEGATIVE mg/dL   Protein, ur NEGATIVE  NEGATIVE mg/dL   Urobilinogen, UA 0.2  0.0 - 1.0 mg/dL   Nitrite NEGATIVE  NEGATIVE   Leukocytes, UA NEGATIVE  NEGATIVE  POCT I-STAT  TROPONIN I      Result Value Range   Troponin i, poc 0.00  0.00 - 0.08 ng/mL   Comment 3            Dg Chest 2 View  11/24/2012   CLINICAL DATA:  Shortness of Breath  EXAM: CHEST  2 VIEW  COMPARISON:  July 22, 2012  FINDINGS: Lungs are clear. Heart size and pulmonary vascularity are normal. No adenopathy. Pacemaker leads are attached to the the right atrium and right ventricle. There is also a lead attached to the left ventricle. There is degenerative change in the thoracic spine. No adenopathy. There is postoperative change in the lower cervical spine.  IMPRESSION: No edema or consolidation.   Electronically Signed   By: Lowella Grip M.D.   On: 11/24/2012 19:09      12:30am  Breathing treatment significantly helped patient. Urinalysis is normal. Pt using her inhaler and coughing.   Will treat for URI and have her follow-up with PCP.  Rx: preddose pack, Levaquin and albuterol inhaler.  67 y.o.[name]'s evaluation in the Emergency Department is complete. It has been determined that no acute conditions requiring further emergency intervention are present at this time. The patient/guardian have been advised of the diagnosis and plan. We have discussed signs and symptoms that warrant  return to the ED, such as changes or worsening in symptoms.  Vital signs are stable at discharge. Filed Vitals:   11/25/12 0000  BP: 135/63  Pulse: 75  Temp:   Resp: 20    Patient/guardian has voiced understanding and agreed to follow-up with the PCP or specialist.   Linus Mako, PA-C 11/25/12 [id number]

## 2012-11-24 NOTE — ED Notes (Signed)
Pt reports to the ED for eval of mid sternal CP x 5 days. Pt has not tried any nitro or OTC medications. Pt went to her PCP where they verified her pacemaker was working and sent her here. Pt describes the pain as chest pressure. Pt reports the only associated symptoms were SOB. Pt reports the pain radiates through into her back. Pt denies any N/V, diaphoresis, or any other symptoms. Pt has a ventricular pacemaker in place and 3 lead for EMS showed ventricular pacing. Pt has had 324 of ASA and 2 nitros PTA. Pt also placed on 2 L of O2 via nasal cannula. Pt also reports abdominal swelling. Pt A&O x4. Skin warm and dry and resp e/u. Pt reporting pain of 4/10 chest pressure at this time.

## 2012-11-24 NOTE — ED Provider Notes (Signed)
Medical screening examination/treatment/procedure(s) were conducted as a shared visit with non-physician practitioner(s) and myself.  I personally evaluated the patient during the encounter   Patient with chest pain, relieved by nitro. Hx of non-obstructive coronary disease managed medically in past. Will consult cards for their eval.  Ephraim Hamburger, MD 11/24/12 2158

## 2012-11-24 NOTE — ED Provider Notes (Addendum)
CSN: [id number]     Arrival date & time 11/24/12  1737 History   First MD Initiated Contact with Patient 11/24/12 1801     Chief Complaint  Patient presents with  . Chest Pain   (Consider location/radiation/quality/duration/timing/severity/associated sxs/prior Treatment) Patient is a 67 y.o. female presenting with chest pain. The history is provided by the patient. No language interpreter was used.  Chest Pain Pain location:  Substernal area Pain quality: aching and pressure   Pain radiates to:  Upper back and L shoulder Pain radiates to the back: yes   Pain severity:  Mild Onset quality:  Gradual Duration:  5 days Timing:  Intermittent Relieved by:  Nitroglycerin and rest Associated symptoms: shortness of breath   Associated symptoms: no anxiety, no cough, no dizziness, no fever, no nausea, no syncope, not vomiting and no weakness   Shortness of breath:    Severity:  Moderate   Onset quality:  Gradual   Duration:  1 day   Timing:  Constant   Progression:  Worsening Risk factors: no prior DVT/PE   Risk factors comment:  Recent pacemaker placed  Pt is a 67 year old female who presents from her PCP this afternoon with chest pain and worsening shortness of breath. She reports her chest pain as pressure in the middle of her chest and radiating into her left shoulder and back. She denies nausea, vomiting, diarrhea, fever and chills. She has a ventricular pacemaker in place that was recently replaced. She has had one aspirin and 2 nitroglycerin prior to arrival by EMS with relief. She is a well-appearing and in no acute distress at this time. She denies a history of PE or DVT.   Past Medical History  Diagnosis Date  . Asthma   . Hypertension   . Arthritis   . GERD (gastroesophageal reflux disease)   . Diverticulosis   . Anemia   . CAD (coronary artery disease)   . Fatty liver disease, nonalcoholic   . Internal hemorrhoids   . Gastritis   . Hyperplastic colon polyp   . Sleep  apnea   . ICD (implantable cardiac defibrillator) in place   . Shortness of breath   . IBS (irritable bowel syndrome)   . Anxiety   . CHF (congestive heart failure)   . H/O hiatal hernia   . Obesity   . Back pain    Past Surgical History  Procedure Laterality Date  . Pacemaker insertion  2001    BS BivICD  . Cardiac defibrillator placement    . Appendectomy    . Knee surgery      right  . Ankle surgery      right  . Tubal ligation    . Cervical spine surgery      plate   . Coronary angiogram  2010  . Abdominal ultrasound  12/01/2011    Peripelvic cysts- #1- 2.4x1.9x2.3cm, #2-1.2x0.9x1.2cm  . Cardiac catheterization  05/17/1999    No significant coronary obstructive disease w/ mild 20% luminal irregularity of the first diag branch of the LAD  . Cardiac catheterization  07/08/2002    No significant CAD, moderately depressed LV systolic function  . Cardiac catheterization Bilateral 04/26/2007    Normal findings, recommend medical therapy  . Cardiac catheterization  02/18/2008    Moderate CAD, would benefit from having a functional study, recommend continue medical therapy  . Cardiac catheterization  07/23/2012    Medical therapy  . Lexiscan myoview  11/14/2011    Mild-moderate defect  seen in Mid Inferolateral and Mid Anterolateral regions-consistant w/ infarct/scar. No significant ischemia demonstrated.  . Transthoracic echocardiogram  07/23/2012    EF 55-60%, normal-mild   Family History  Problem Relation Age of Onset  . Breast cancer Mother   . Diabetes Mother   . Heart disease Maternal Grandmother   . Kidney disease Maternal Grandmother   . Diabetes Maternal Grandmother   . Glaucoma Maternal Aunt   . Heart disease Maternal Aunt   . Asthma Sister    History  Substance Use Topics  . Smoking status: Former Smoker -- 0.25 packs/day for 3 years    Types: Cigarettes    Quit date: 02/11/1968  . Smokeless tobacco: Never Used  . Alcohol Use: No   OB History   Grav Para Term  Preterm Abortions TAB SAB Ect Mult Living                 Review of Systems  Constitutional: Negative for fever.  Respiratory: Positive for shortness of breath. Negative for cough and wheezing.   Cardiovascular: Positive for chest pain. Negative for syncope.  Gastrointestinal: Negative for nausea and vomiting.  Neurological: Negative for dizziness, syncope, weakness and light-headedness.  All other systems reviewed and are negative.    Allergies  Ivp dye; Shellfish-derived products; Sulfa antibiotics; and Iodine  Home Medications   Current Outpatient Rx  Name  Route  Sig  Dispense  Refill  . albuterol (PROVENTIL HFA;VENTOLIN HFA) 108 (90 BASE) MCG/ACT inhaler   Inhalation   Inhale 2 puffs into the lungs every 6 (six) hours as needed for wheezing.         [name] amLODipine (NORVASC) 10 MG tablet   Oral   Take 10 mg by mouth daily.         [name] aspirin 81 MG chewable tablet   Oral   Chew 81 mg by mouth daily.         . budesonide-formoterol (SYMBICORT) 160-4.5 MCG/ACT inhaler   Inhalation   Inhale 2 puffs into the lungs 2 (two) times daily.         . busPIRone (BUSPAR) 5 MG tablet   Oral   Take 5 mg by mouth 2 (two) times daily as needed (for anxiety).          . cetirizine (ZYRTEC) 10 MG tablet   Oral   Take 10 mg by mouth daily.          [name] docusate sodium (COLACE) 100 MG capsule   Oral   Take 100-200 mg by mouth at bedtime as needed for constipation.          . fish oil-omega-3 fatty acids 1000 MG capsule   Oral   Take 3 g by mouth daily.         . fluticasone (FLONASE) 50 MCG/ACT nasal spray   Nasal   Place 1 spray into the nose 2 (two) times daily as needed for rhinitis or allergies.          . furosemide (LASIX) 40 MG tablet   Oral   Take 40 mg by mouth 2 (two) times daily.           . Ipratropium-Albuterol (COMBIVENT RESPIMAT) 20-100 MCG/ACT AERS respimat   Inhalation   Inhale 1 puff into the lungs every 6 (six) hours as needed for  wheezing or shortness of breath.          . losartan (COZAAR) 100 MG tablet   Oral   Take 100  mg by mouth daily.          . metoprolol succinate (TOPROL-XL) 50 MG 24 hr tablet   Oral   Take 75 mg by mouth daily.          . nitroGLYCERIN (NITROSTAT) 0.4 MG SL tablet   Sublingual   Place 0.4 mg under the tongue every 5 (five) minutes as needed for chest pain.         [name] omeprazole (PRILOSEC) 20 MG capsule   Oral   Take 20 mg by mouth 2 (two) times daily. Take 30- 60 min before your first and last meals of the day         . oxyCODONE-acetaminophen (PERCOCET/ROXICET) 5-325 MG per tablet   Oral   Take 1 tablet by mouth every 6 (six) hours as needed for pain.         . potassium chloride SA (K-DUR,KLOR-CON) 20 MEQ tablet   Oral   Take 20 mEq by mouth 2 (two) times daily.          . pravastatin (PRAVACHOL) 80 MG tablet   Oral   Take 80 mg by mouth at bedtime.         . pyridOXINE (VITAMIN B-6) 100 MG tablet   Oral   Take 100 mg by mouth every morning.         [name] spironolactone (ALDACTONE) 25 MG tablet   Oral   Take 12.5 mg by mouth 2 (two) times daily.          . traMADol (ULTRAM) 50 MG tablet   Oral   Take 50 mg by mouth every 6 (six) hours as needed for pain.           BP 145/89  Pulse 89  Temp(Src) 98.7 F (37.1 C) (Oral)  Resp 18  Ht 5\' 5"  (1.651 m)  Wt 235 lb (106.595 kg)  BMI 39.11 kg/m2  SpO2 97% Physical Exam  Nursing note and vitals reviewed. Constitutional: She is oriented to person, place, and time. She appears well-developed and well-nourished. No distress.  HENT:  Head: Normocephalic and atraumatic.  Mouth/Throat: Oropharynx is clear and moist.  Eyes: Conjunctivae and EOM are normal. Pupils are equal, round, and reactive to light.  Neck: Neck supple. No JVD present. No thyromegaly present.  Cardiovascular: Normal rate, regular rhythm, normal heart sounds and intact distal pulses.   Pulmonary/Chest: Effort normal and breath sounds  normal. No respiratory distress. She has no wheezes.  Abdominal: Soft. Bowel sounds are normal. She exhibits no distension. There is no tenderness.  Musculoskeletal: Normal range of motion.  Neurological: She is alert and oriented to person, place, and time.  Skin: Skin is warm and dry.  Psychiatric: She has a normal mood and affect. Her behavior is normal. Judgment and thought content normal.    ED Course  Procedures (including critical care time) Labs Review Labs Reviewed  CBC  COMPREHENSIVE METABOLIC PANEL   Imaging Review No results found.  EKG Interpretation   None      Date: 11/24/2012  Rate: 84  Rhythm: normal sinus rhythm  QRS Axis: normal  Intervals: normal  ST/T Wave abnormalities: normal and nonspecific ST/T changes  Conduction Disutrbances:none and right bundle branch block  Narrative Interpretation: sinus rhythm, left atrial enlargement, RBBB, anterolateral infarct, age indeterminate  Old EKG Reviewed: none available Reviewed by Dr. Regenia Skeeter. 8:27 PM Talked with [facility] Dr. Colon Flattery, will come and see pt.   MDM   1. Chest pain  Substernal chest pain, relieved by NTG x2. Recent pacemake replacement in September. Last cardiac cath 07/23/12 and managed medically. Echo on 07/23/12 showed EF 50%. Cardiology will come and see pt here in ER and dispo.     Elisha Headland, NP 11/24/12 2030  Elisha Headland, NP 12/07/12 [phone]

## 2012-11-25 ENCOUNTER — Telehealth: Payer: Self-pay | Admitting: *Deleted

## 2012-11-25 MED ORDER — LEVOFLOXACIN 500 MG PO TABS
500.0000 mg | ORAL_TABLET | Freq: Once | ORAL | Status: AC
  Administered 2012-11-25: 500 mg via ORAL
  Filled 2012-11-25: qty 1

## 2012-11-25 MED ORDER — PREDNISONE 20 MG PO TABS
60.0000 mg | ORAL_TABLET | Freq: Once | ORAL | Status: AC
  Administered 2012-11-25: 60 mg via ORAL
  Filled 2012-11-25: qty 3

## 2012-11-25 MED ORDER — PREDNISONE 10 MG PO TABS: 20.0000 mg | ORAL_TABLET | Freq: Every day | ORAL | Status: DC

## 2012-11-25 MED ORDER — LEVOFLOXACIN 500 MG PO TABS: 500.0000 mg | ORAL_TABLET | Freq: Every day | ORAL | Status: DC

## 2012-11-25 MED ORDER — ALBUTEROL SULFATE HFA 108 (90 BASE) MCG/ACT IN AERS
2.0000 | INHALATION_SPRAY | RESPIRATORY_TRACT | Status: DC | PRN
  Administered 2012-11-25: 2 via RESPIRATORY_TRACT
  Filled 2012-11-25: qty 6.7

## 2012-11-25 NOTE — Telephone Encounter (Signed)
Patient left a vm message about sxms.  Spoke to patient about her sxms of SOB, fever, fatigue. Patient stated that she went to ER and discovered that she had an URI. Patient was given prednisone and antibiotics. Patient will follow up with PCP as scheduled.

## 2012-11-25 NOTE — ED Provider Notes (Signed)
Medical screening examination/treatment/procedure(s) were performed by non-physician practitioner and as supervising physician I was immediately available for consultation/collaboration.   Lebron Quam, MD 11/25/12 1352

## 2012-11-26 IMAGING — US US ABDOMEN COMPLETE
1 series · 13 of 25 positions shown · non-contrast
Comparison: Abdominal ultrasound 04/23/2009.

CLINICAL DATA: Abdominal and back pain.

COMPLETE ABDOMINAL ULTRASOUND

[Series 1: us abdomen complete · 0.32mm/px · 13 of 83 slices shown]
[im 1/83]
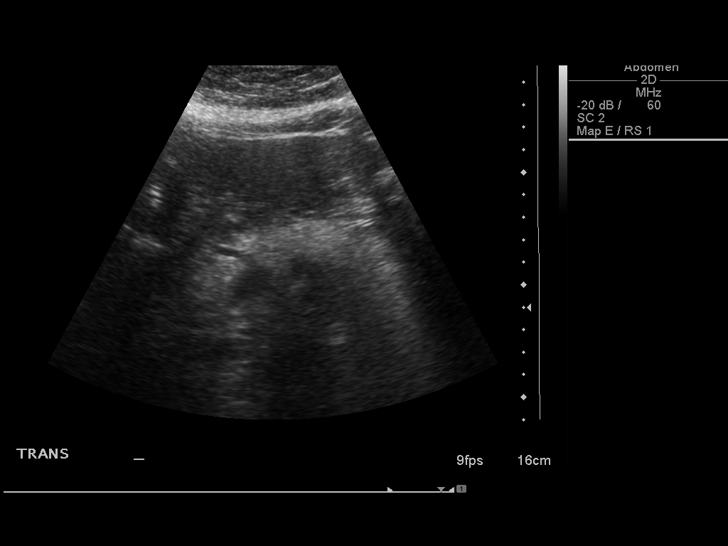
[im 7/83]
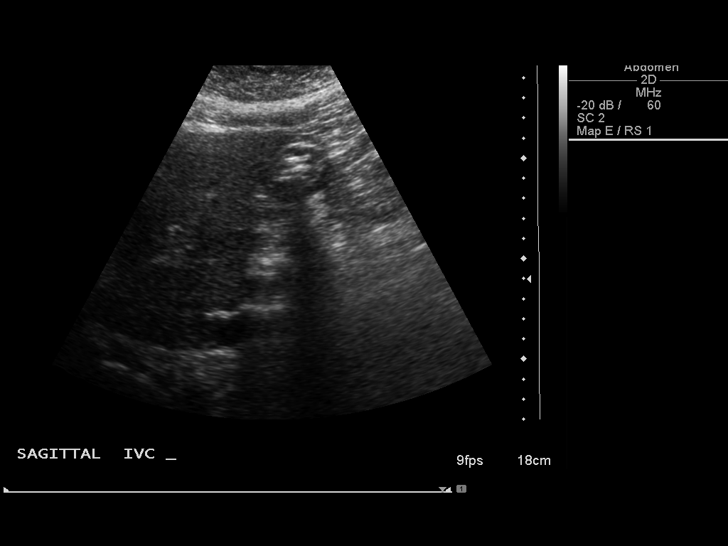
[im 14/83]
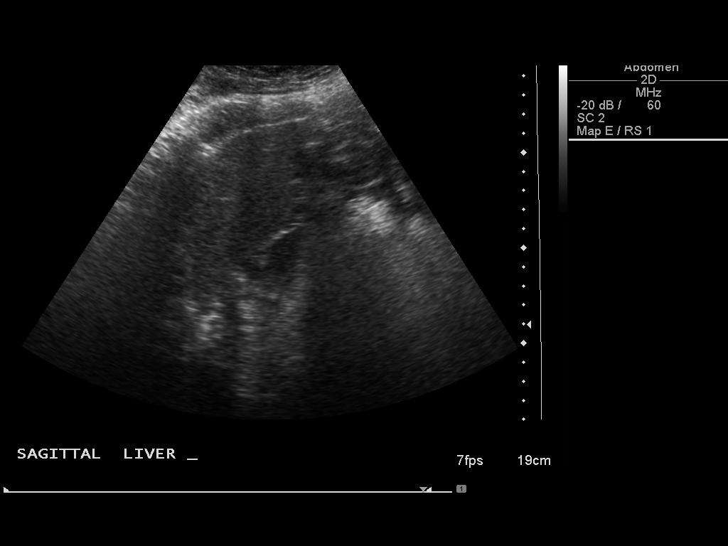
[im 21/83]
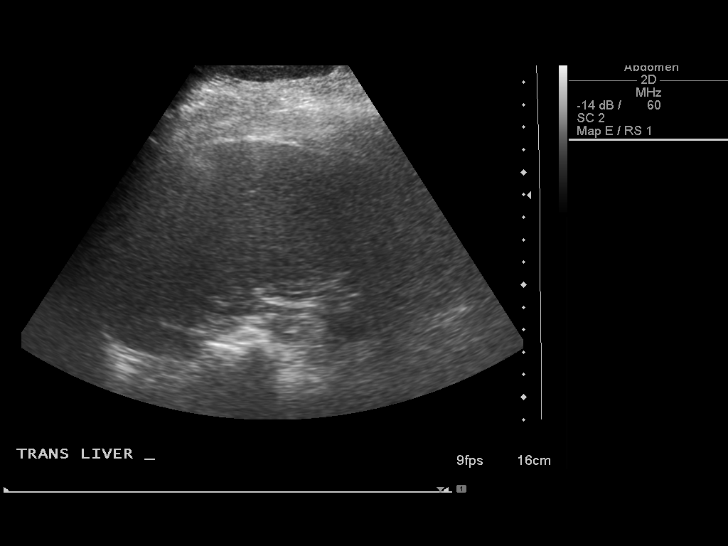
[im 28/83]
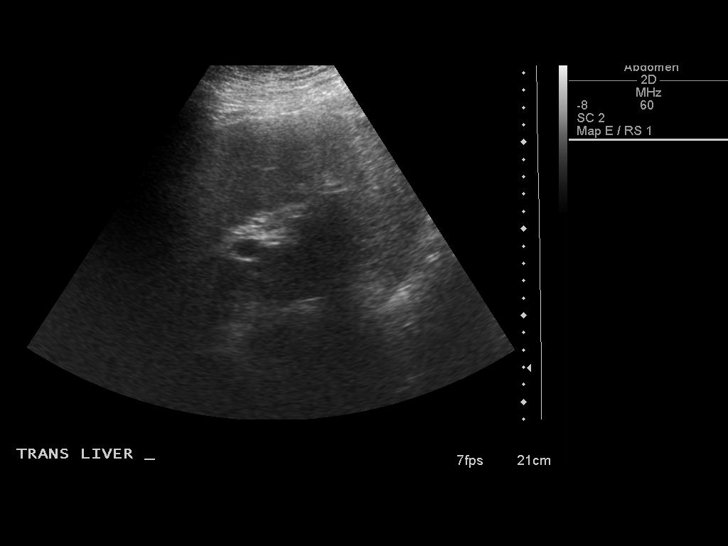
[im 35/83]
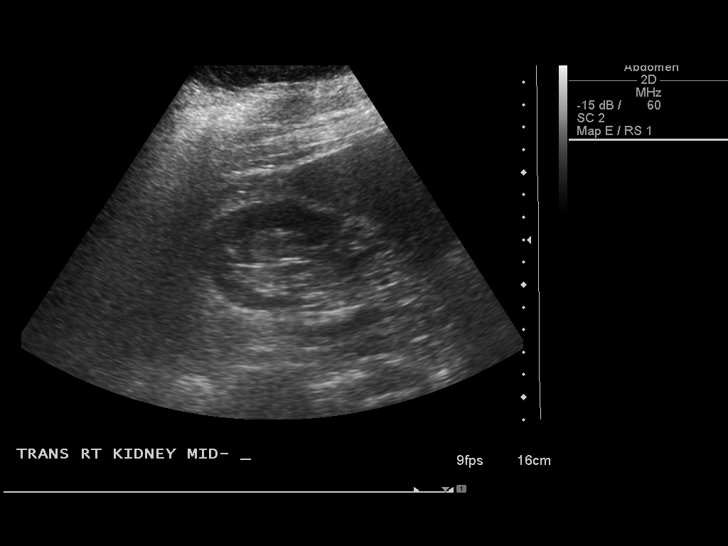
[im 42/83]
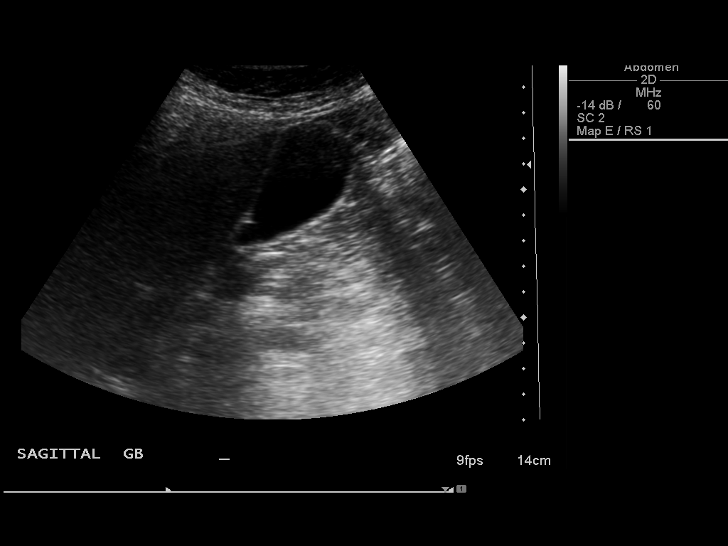
[im 48/83]
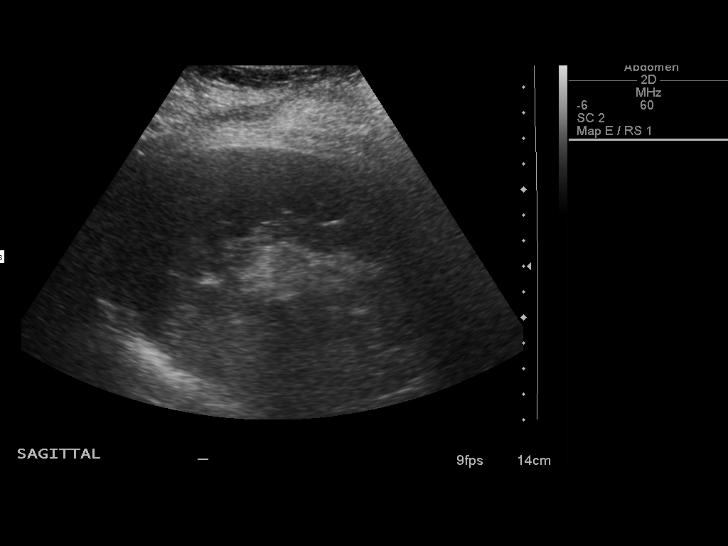
[im 55/83]
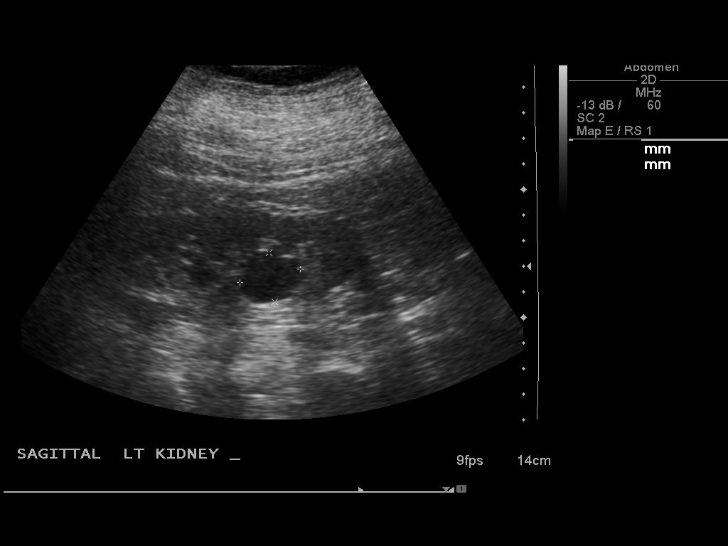
[im 62/83]
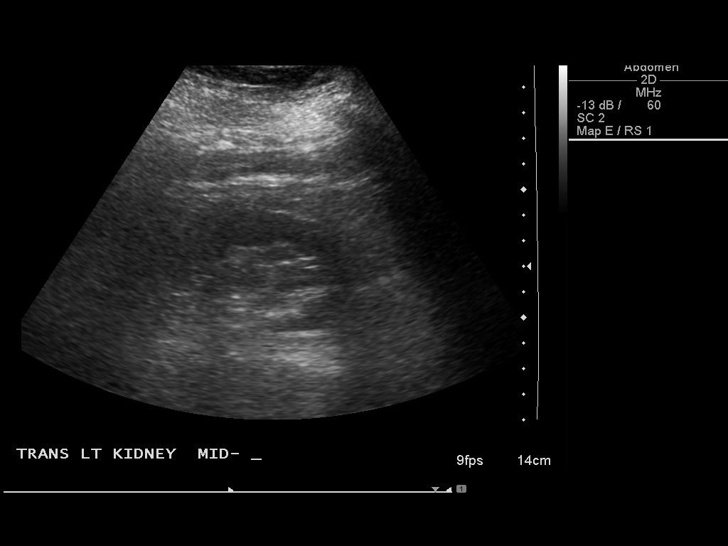
[im 69/83]
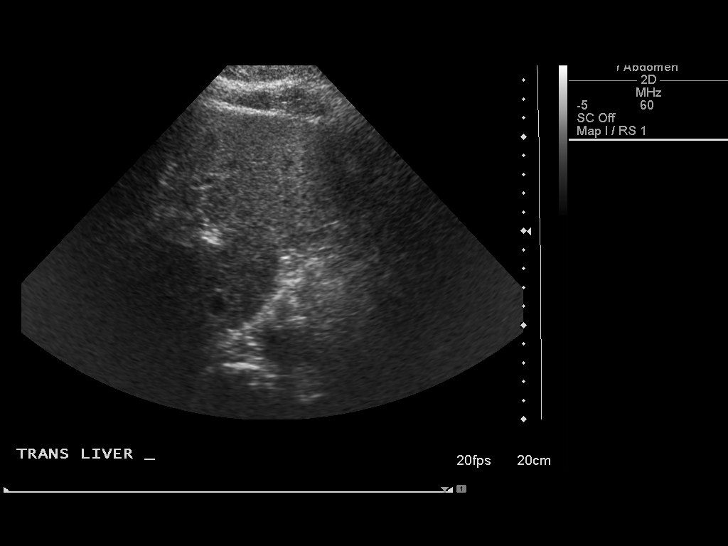
[im 76/83]
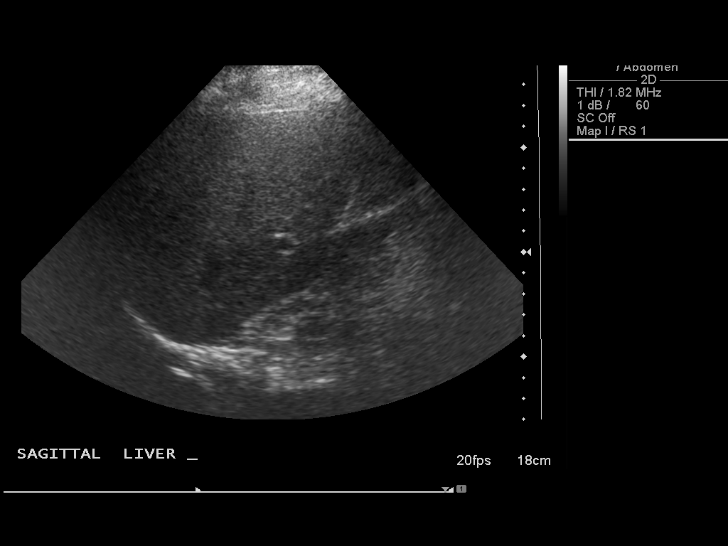
[im 83/83]
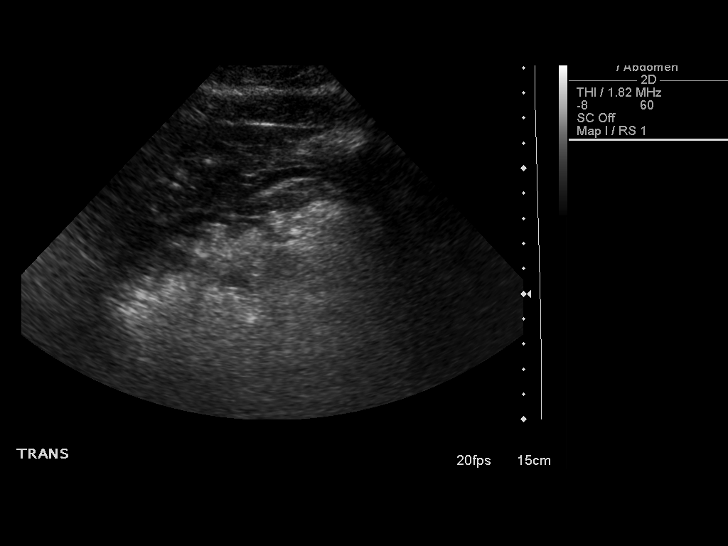

[13 of 25 positions shown; findings below may reference images not displayed]

FINDINGS: Gallbladder:  No shadowing gallstones or echogenic sludge.  No
gallbladder wall thickening or pericholecystic fluid.  Negative
sonographic Murphy's sign according to the ultrasound technologist.

Common bile duct:  Normal caliber measuring 3.9 mm in the porta
hepatis.

Liver:  Diffusely increased echogenicity, without focal cystic or
solid lesions.  No intrahepatic biliary ductal dilatation.  The
normal hip out pedal flow within the portal vein.

IVC:  Patent throughout its visualized course in the abdomen.

Pancreas:  Poorly visualized secondary to overlying bowel gas.

Spleen:  Normal size and echotexture without focal parenchymal
abnormality.8.5 cm in length.

Right Kidney:  Diffuse cortical thinning.  11.0 cm in length.  No
focal cystic or solid lesions.  No hydronephrosis.

Left Kidney:  Diffuse cortical thinning.  There are two anechoic
lesions with associated increased through transmission, one in the
renal pelvis measuring 1.2 x 0.9 x 1.2 cm, most compatible with a
parapelvic cyst.  The other lesion is in the lower pole measuring
2.4 x 1.9 x 2.3 cm, compatible with a small renal cyst.

Abdominal aorta:  Measures up to 2.4 cm in diameter proximally.
The mid and distal aorta are partially obscured by bowel gas.
IMPRESSION: 1.  No acute findings to account for the patient's symptoms.
Specifically, no cholelithiasis or evidence to suggest acute
cholecystitis.
2.  Diffusely increased echogenicity throughout the liver,
suggestive of hepatic steatosis.
3.  Mild cortical thinning in the kidneys bilaterally.
4.  Small cyst in the lower pole of the left kidney and small
parapelvic cyst in the left renal pelvis, as above.

## 2012-12-02 ENCOUNTER — Other Ambulatory Visit: Payer: Self-pay | Admitting: Orthopaedic Surgery

## 2012-12-02 DIAGNOSIS — M25512 Pain in left shoulder: Secondary | ICD-10-CM

## 2012-12-08 NOTE — ED Provider Notes (Signed)
Medical screening examination/treatment/procedure(s) were conducted as a shared visit with non-physician practitioner(s) and myself.  I personally evaluated the patient during the encounter.  EKG Interpretation     Ventricular Rate:  84 PR Interval:  192 QRS Duration: 154 QT Interval:  470 QTC Calculation: 556 R Axis:   -95 Text Interpretation:  Sinus rhythm Probable left atrial enlargement Right bundle branch block Anterolateral infarct, age indeterminate ED PHYSICIAN INTERPRETATION AVAILABLE IN [place]              Patient with atypical symptoms but known heart disease. Appears well here, but given her increase in dyspnea and possible anginal sx, will get cardiology to eval.  Ephraim Hamburger, MD 12/08/12 1108

## 2012-12-09 ENCOUNTER — Ambulatory Visit
Admission: RE | Admit: 2012-12-09 | Discharge: 2012-12-09 | Disposition: A | Discharge status: Home / Self Care | Payer: Medicare Other | Source: Ambulatory Visit | Attending: Orthopaedic Surgery | Admitting: Orthopaedic Surgery

## 2012-12-09 DIAGNOSIS — M25512 Pain in left shoulder: Secondary | ICD-10-CM

## 2012-12-24 ENCOUNTER — Telehealth: Payer: Self-pay | Admitting: Internal Medicine

## 2012-12-24 NOTE — Telephone Encounter (Signed)
Pt was seen by Dr. Loletha Grayer 11-12-12/mt

## 2012-12-29 ENCOUNTER — Other Ambulatory Visit: Payer: Self-pay | Admitting: *Deleted

## 2012-12-29 MED ORDER — SPIRONOLACTONE 25 MG PO TABS: 12.5000 mg | ORAL_TABLET | Freq: Two times a day (BID) | ORAL | Status: DC

## 2013-02-09 ENCOUNTER — Other Ambulatory Visit: Payer: Self-pay | Admitting: Family Medicine

## 2013-02-09 DIAGNOSIS — N6322 Unspecified lump in the left breast, upper inner quadrant: Secondary | ICD-10-CM

## 2013-02-22 ENCOUNTER — Ambulatory Visit
Admission: RE | Admit: 2013-02-22 | Discharge: 2013-02-22 | Disposition: A | Discharge status: Home / Self Care | Payer: Medicare Other | Source: Ambulatory Visit | Attending: Family Medicine | Admitting: Family Medicine

## 2013-02-22 DIAGNOSIS — N6322 Unspecified lump in the left breast, upper inner quadrant: Secondary | ICD-10-CM

## 2013-02-28 ENCOUNTER — Ambulatory Visit (INDEPENDENT_AMBULATORY_CARE_PROVIDER_SITE_OTHER): Payer: Medicare Other | Admitting: *Deleted

## 2013-02-28 DIAGNOSIS — I509 Heart failure, unspecified: Secondary | ICD-10-CM

## 2013-02-28 DIAGNOSIS — I428 Other cardiomyopathies: Secondary | ICD-10-CM

## 2013-03-07 ENCOUNTER — Encounter: Payer: Self-pay | Admitting: *Deleted

## 2013-03-17 ENCOUNTER — Encounter: Payer: Self-pay | Admitting: *Deleted

## 2013-03-23 ENCOUNTER — Other Ambulatory Visit: Payer: Self-pay | Admitting: *Deleted

## 2013-03-23 MED ORDER — LOSARTAN POTASSIUM 100 MG PO TABS: 100.0000 mg | ORAL_TABLET | Freq: Every day | ORAL | Status: DC

## 2013-04-15 ENCOUNTER — Emergency Department (HOSPITAL_COMMUNITY): Payer: Medicare Other

## 2013-04-15 ENCOUNTER — Emergency Department (HOSPITAL_COMMUNITY)
Admission: EM | Admit: 2013-04-15 | Discharge: 2013-04-15 | Disposition: A | Discharge status: Home / Self Care | Payer: Medicare Other | Attending: Emergency Medicine | Admitting: Emergency Medicine

## 2013-04-15 ENCOUNTER — Encounter (HOSPITAL_COMMUNITY): Payer: Self-pay | Admitting: Emergency Medicine

## 2013-04-15 DIAGNOSIS — J45901 Unspecified asthma with (acute) exacerbation: Secondary | ICD-10-CM | POA: Insufficient documentation

## 2013-04-15 DIAGNOSIS — Z87891 Personal history of nicotine dependence: Secondary | ICD-10-CM | POA: Insufficient documentation

## 2013-04-15 DIAGNOSIS — Z79899 Other long term (current) drug therapy: Secondary | ICD-10-CM | POA: Insufficient documentation

## 2013-04-15 DIAGNOSIS — R110 Nausea: Secondary | ICD-10-CM | POA: Insufficient documentation

## 2013-04-15 DIAGNOSIS — Z862 Personal history of diseases of the blood and blood-forming organs and certain disorders involving the immune mechanism: Secondary | ICD-10-CM | POA: Insufficient documentation

## 2013-04-15 DIAGNOSIS — Z95810 Presence of automatic (implantable) cardiac defibrillator: Secondary | ICD-10-CM | POA: Insufficient documentation

## 2013-04-15 DIAGNOSIS — R5383 Other fatigue: Secondary | ICD-10-CM

## 2013-04-15 DIAGNOSIS — F411 Generalized anxiety disorder: Secondary | ICD-10-CM | POA: Insufficient documentation

## 2013-04-15 DIAGNOSIS — K219 Gastro-esophageal reflux disease without esophagitis: Secondary | ICD-10-CM | POA: Insufficient documentation

## 2013-04-15 DIAGNOSIS — J441 Chronic obstructive pulmonary disease with (acute) exacerbation: Secondary | ICD-10-CM | POA: Insufficient documentation

## 2013-04-15 DIAGNOSIS — I10 Essential (primary) hypertension: Secondary | ICD-10-CM | POA: Insufficient documentation

## 2013-04-15 DIAGNOSIS — I509 Heart failure, unspecified: Secondary | ICD-10-CM | POA: Insufficient documentation

## 2013-04-15 DIAGNOSIS — Z860100 Personal history of colon polyps, unspecified: Secondary | ICD-10-CM | POA: Insufficient documentation

## 2013-04-15 DIAGNOSIS — R55 Syncope and collapse: Secondary | ICD-10-CM

## 2013-04-15 DIAGNOSIS — M129 Arthropathy, unspecified: Secondary | ICD-10-CM | POA: Insufficient documentation

## 2013-04-15 DIAGNOSIS — Z7982 Long term (current) use of aspirin: Secondary | ICD-10-CM | POA: Insufficient documentation

## 2013-04-15 DIAGNOSIS — R42 Dizziness and giddiness: Secondary | ICD-10-CM | POA: Insufficient documentation

## 2013-04-15 DIAGNOSIS — IMO0002 Reserved for concepts with insufficient information to code with codable children: Secondary | ICD-10-CM | POA: Insufficient documentation

## 2013-04-15 DIAGNOSIS — E669 Obesity, unspecified: Secondary | ICD-10-CM | POA: Insufficient documentation

## 2013-04-15 DIAGNOSIS — Z9889 Other specified postprocedural states: Secondary | ICD-10-CM | POA: Insufficient documentation

## 2013-04-15 DIAGNOSIS — I2510 Atherosclerotic heart disease of native coronary artery without angina pectoris: Secondary | ICD-10-CM | POA: Insufficient documentation

## 2013-04-15 DIAGNOSIS — R5381 Other malaise: Secondary | ICD-10-CM | POA: Insufficient documentation

## 2013-04-15 DIAGNOSIS — R0602 Shortness of breath: Secondary | ICD-10-CM

## 2013-04-15 LAB — CBC
HCT: 44.1 % (ref 36.0–46.0)
Hemoglobin: 14.7 g/dL (ref 12.0–15.0)
MCH: 29.2 pg (ref 26.0–34.0)
MCHC: 33.3 g/dL (ref 30.0–36.0)
MCV: 87.5 fL (ref 78.0–100.0)
Platelets: 151 10*3/uL (ref 150–400)
RBC: 5.04 MIL/uL (ref 3.87–5.11)
RDW: 14.1 % (ref 11.5–15.5)
WBC: 7 10*3/uL (ref 4.0–10.5)

## 2013-04-15 LAB — URINALYSIS, ROUTINE W REFLEX MICROSCOPIC
Bilirubin Urine: NEGATIVE
Glucose, UA: NEGATIVE mg/dL
Hgb urine dipstick: NEGATIVE
Ketones, ur: NEGATIVE mg/dL
Leukocytes, UA: NEGATIVE
Nitrite: NEGATIVE
Protein, ur: NEGATIVE mg/dL
Specific Gravity, Urine: 1.013 (ref 1.005–1.030)
Urobilinogen, UA: 0.2 mg/dL (ref 0.0–1.0)
pH: 6 (ref 5.0–8.0)

## 2013-04-15 LAB — PRO B NATRIURETIC PEPTIDE: Pro B Natriuretic peptide (BNP): 125.1 pg/mL — ABNORMAL HIGH (ref 0–125)

## 2013-04-15 LAB — BASIC METABOLIC PANEL
BUN: 21 mg/dL (ref 6–23)
CO2: 28 mEq/L (ref 19–32)
Calcium: 9.5 mg/dL (ref 8.4–10.5)
Chloride: 103 mEq/L (ref 96–112)
Creatinine, Ser: 0.94 mg/dL (ref 0.50–1.10)
GFR calc Af Amer: 70 mL/min — ABNORMAL LOW (ref >90)
GFR calc non Af Amer: 61 mL/min — ABNORMAL LOW (ref >90)
Glucose, Bld: 105 mg/dL — ABNORMAL HIGH (ref 70–99)
Potassium: 4.6 mEq/L (ref 3.7–5.3)
Sodium: 141 mEq/L (ref 137–147)

## 2013-04-15 LAB — TROPONIN I
Troponin I: <0.3 ng/mL (ref <0.30)
Troponin I: <0.3 ng/mL (ref <0.30)

## 2013-04-15 LAB — CBG MONITORING, ED: Glucose-Capillary: 103 mg/dL — ABNORMAL HIGH (ref 70–99)

## 2013-04-15 NOTE — ED Notes (Signed)
Patient transported to X-ray 

## 2013-04-15 NOTE — ED Notes (Signed)
Pt c/o generalized weakness and near syncope, arrived via EMS. States she felt as if she had to pass out while driving, was able to pull over and call family for assistance. C/o of back and abdominal pain, as well as a headache. States she has headaches regularly, back/abd pain as been occurring for last several months. Rates pain 7/10. Alert and oriented x4, skin dry and normal color for race.

## 2013-04-15 NOTE — ED Provider Notes (Signed)
Medical screening examination/treatment/procedure(s) were conducted as a shared visit with non-physician practitioner(s) and myself.  I personally evaluated the patient during the encounter.   EKG Interpretation None       Date: 04/15/2013  Rate: 69  Rhythm: Ventricular paced complexes  QRS Axis: right  Intervals: paced  ST/T Wave abnormalities: nonspecific ST/T changes  Conduction Disutrbances:paced  Narrative Interpretation:   Old EKG Reviewed: none available    The patient seen by me. Patient onset 2 days ago general weakness near syncope feeling upon exertion seen at [place] and sent to the ED for evaluation. Patient nontoxic no acute distress. Patient workup here to including interrogation of her pacemaker without any significant abnormal findings. Labs without significant findings. Labs listed below.  Results for orders placed during the hospital encounter of 04/15/13  CBC      Result Value Ref Range   WBC 7.0  4.0 - 10.5 K/uL   RBC 5.04  3.87 - 5.11 MIL/uL   Hemoglobin 14.7  12.0 - 15.0 g/dL   HCT 44.1  36.0 - 46.0 %   MCV 87.5  78.0 - 100.0 fL   MCH 29.2  26.0 - 34.0 pg   MCHC 33.3  30.0 - 36.0 g/dL   RDW 14.1  11.5 - 15.5 %   Platelets 151  150 - 400 K/uL  BASIC METABOLIC PANEL      Result Value Ref Range   Sodium 141  137 - 147 mEq/L   Potassium 4.6  3.7 - 5.3 mEq/L   Chloride 103  96 - 112 mEq/L   CO2 28  19 - 32 mEq/L   Glucose, Bld 105 (*) 70 - 99 mg/dL   BUN 21  6 - 23 mg/dL   Creatinine, Ser 0.94  0.50 - 1.10 mg/dL   Calcium 9.5  8.4 - 10.5 mg/dL   GFR calc non Af Amer 61 (*) >90 mL/min   GFR calc Af Amer 70 (*) >90 mL/min  URINALYSIS, ROUTINE W REFLEX MICROSCOPIC      Result Value Ref Range   Color, Urine YELLOW  YELLOW   APPearance CLEAR  CLEAR   Specific Gravity, Urine 1.013  1.005 - 1.030   pH 6.0  5.0 - 8.0   Glucose, UA NEGATIVE  NEGATIVE mg/dL   Hgb urine dipstick NEGATIVE  NEGATIVE   Bilirubin Urine NEGATIVE  NEGATIVE   Ketones, ur  NEGATIVE  NEGATIVE mg/dL   Protein, ur NEGATIVE  NEGATIVE mg/dL   Urobilinogen, UA 0.2  0.0 - 1.0 mg/dL   Nitrite NEGATIVE  NEGATIVE   Leukocytes, UA NEGATIVE  NEGATIVE  PRO B NATRIURETIC PEPTIDE      Result Value Ref Range   Pro B Natriuretic peptide (BNP) 125.1 (*) 0 - 125 pg/mL  TROPONIN I      Result Value Ref Range   Troponin I <0.30  <0.30 ng/mL  TROPONIN I      Result Value Ref Range   Troponin I <0.30  <0.30 ng/mL  CBG MONITORING, ED      Result Value Ref Range   Glucose-Capillary 103 (*) 70 - 99 mg/dL   Dg Chest 2 View  04/15/2013   CLINICAL DATA:  Weakness, hypertension  EXAM: CHEST  2 VIEW  COMPARISON:  11/24/2012  FINDINGS: Cardiomegaly again noted. 4 leads cardiac pacemaker is unchanged in position. No acute infiltrate or pulmonary edema. Mild degenerative changes thoracic spine.  IMPRESSION: No active cardiopulmonary disease.   Electronically Signed   By: Julien Girt  Pop M.D.   On: 04/15/2013 16:29    The patient's chest x-ray negative for pneumonia pneumothorax or pulmonary edema. Troponins x2 were negative. Urinalysis negative for urinary tract infection not no leukocytosis no significant anemia electrolytes normal. Patient stable for discharge home and followup with primary care Dr. Patient will return for any worse symptoms are certainly for any syncope.  Mervin Kung, MD 04/15/13 [phone]

## 2013-04-15 NOTE — ED Notes (Signed)
[employer] at bedside

## 2013-04-15 NOTE — ED Notes (Signed)
[employer] report given to Provider.

## 2013-04-15 NOTE — ED Notes (Signed)
Onset 2 days ago general weakness near syncope feeling upon exertion.  Seen at [place] sent to ED for evaluation. Alert answering following commands appropriate.

## 2013-04-15 NOTE — Discharge Instructions (Signed)
Call for a follow up appointment with a Family or Primary Care Provider.  Keep your appointment on 04/19/2013 with Dr. [name]. Return if Symptoms worsen.   Take medication as prescribed.

## 2013-04-15 NOTE — ED Provider Notes (Signed)
CSN: [id number]     Arrival date & time 04/15/13  1339 History   None    Chief Complaint  Patient presents with  . Weakness  . Near Syncope     (Consider location/radiation/quality/duration/timing/severity/associated sxs/prior Treatment) HPI Comments: [name] is a 68 year-old female with a past medical history of COPD, CHF, OSA, obesity, presenting the Emergency Department with a chief complaint of 2 episodes of lightheadedness since yesterday.  The patient ws sent by EMS from her PCP's clinic for further evaluation. The patient reports an abrupt onset of lightheadedness yesterday while driving.  She reports that she has facial flushing and had nausea without emesis. She reports she had to call her son to come pick her up because she was unable to drive.  The patient reports a similar episode today upon standing from a seated position at home she had another episode of lightheadedness today.  She denies LOC with both episodes.  She also reports shortness of breath with exertion.  She denies orthopnea and paroxysmal nocturnal dyspnea.  Denies lower extremity swelling, chest pain or palpitations.  She also complains of ongoing posterior headache, which is being evaluated as an out patient by her PCP. PCP: [name], MD    The history is provided by the patient and medical records. No language interpreter was used.    Past Medical History  Diagnosis Date  . Asthma   . Hypertension   . Arthritis   . GERD (gastroesophageal reflux disease)   . Diverticulosis   . Anemia   . CAD (coronary artery disease)   . Fatty liver disease, nonalcoholic   . Internal hemorrhoids   . Gastritis   . Hyperplastic colon polyp   . Sleep apnea   . ICD (implantable cardiac defibrillator) in place   . Shortness of breath   . IBS (irritable bowel syndrome)   . Anxiety   . CHF (congestive heart failure)   . H/O hiatal hernia   . Obesity   . Back pain    Past Surgical History  Procedure  Laterality Date  . Pacemaker insertion  2001    BS BivICD  . Cardiac defibrillator placement    . Appendectomy    . Knee surgery      right  . Ankle surgery      right  . Tubal ligation    . Cervical spine surgery      plate   . Coronary angiogram  2010  . Abdominal ultrasound  12/01/2011    Peripelvic cysts- #1- 2.4x1.9x2.3cm, #2-1.2x0.9x1.2cm  . Cardiac catheterization  05/17/1999    No significant coronary obstructive disease w/ mild 20% luminal irregularity of the first diag branch of the LAD  . Cardiac catheterization  07/08/2002    No significant CAD, moderately depressed LV systolic function  . Cardiac catheterization Bilateral 04/26/2007    Normal findings, recommend medical therapy  . Cardiac catheterization  02/18/2008    Moderate CAD, would benefit from having a functional study, recommend continue medical therapy  . Cardiac catheterization  07/23/2012    Medical therapy  . Lexiscan myoview  11/14/2011    Mild-moderate defect seen in Mid Inferolateral and Mid Anterolateral regions-consistant w/ infarct/scar. No significant ischemia demonstrated.  . Transthoracic echocardiogram  07/23/2012    EF 55-60%, normal-mild   Family History  Problem Relation Age of Onset  . Breast cancer Mother   . Diabetes Mother   . Heart disease Maternal Grandmother   . Kidney disease Maternal  Grandmother   . Diabetes Maternal Grandmother   . Glaucoma Maternal Aunt   . Heart disease Maternal Aunt   . Asthma Sister    History  Substance Use Topics  . Smoking status: Former Smoker -- 0.25 packs/day for 3 years    Types: Cigarettes    Quit date: 02/11/1968  . Smokeless tobacco: Never Used  . Alcohol Use: No   OB History   Grav Para Term Preterm Abortions TAB SAB Ect Mult Living                 Review of Systems  Constitutional: Negative for fever and chills.  Respiratory: Positive for shortness of breath. Negative for cough and wheezing.   Cardiovascular: Negative for chest pain,  palpitations and leg swelling.  Gastrointestinal: Negative for vomiting, abdominal pain, diarrhea and constipation.  Genitourinary: Negative for dysuria, urgency, frequency and hematuria.  Neurological: Positive for light-headedness. Negative for syncope and weakness.      Allergies  Ivp dye; Shellfish-derived products; Sulfa antibiotics; and Iodine  Home Medications   Current Outpatient Rx  Name  Route  Sig  Dispense  Refill  . albuterol (PROVENTIL HFA;VENTOLIN HFA) 108 (90 BASE) MCG/ACT inhaler   Inhalation   Inhale 2 puffs into the lungs every 6 (six) hours as needed for wheezing.         [name] amLODipine (NORVASC) 10 MG tablet   Oral   Take 10 mg by mouth every evening.          [name] aspirin 81 MG chewable tablet   Oral   Chew 81 mg by mouth daily.         . budesonide-formoterol (SYMBICORT) 160-4.5 MCG/ACT inhaler   Inhalation   Inhale 2 puffs into the lungs 2 (two) times daily.         . busPIRone (BUSPAR) 5 MG tablet   Oral   Take 5 mg by mouth 2 (two) times daily as needed (for anxiety).          . cetirizine (ZYRTEC) 10 MG tablet   Oral   Take 10 mg by mouth daily.          [name] docusate sodium (COLACE) 100 MG capsule   Oral   Take 100-200 mg by mouth at bedtime as needed for constipation.          . fish oil-omega-3 fatty acids 1000 MG capsule   Oral   Take 3 g by mouth daily.         . furosemide (LASIX) 40 MG tablet   Oral   Take 40 mg by mouth 2 (two) times daily.           [name] losartan (COZAAR) 100 MG tablet   Oral   Take 1 tablet (100 mg total) by mouth daily.   60 tablet   6   . metoprolol succinate (TOPROL-XL) 50 MG 24 hr tablet   Oral   Take 25 mg by mouth 2 (two) times daily.          [name] omeprazole (PRILOSEC) 20 MG capsule   Oral   Take 20 mg by mouth 2 (two) times daily. Take 30- 60 min before your first and last meals of the day         . potassium chloride SA (K-DUR,KLOR-CON) 20 MEQ tablet   Oral   Take 20 mEq by mouth  2 (two) times daily.          . pravastatin (PRAVACHOL) 80  MG tablet   Oral   Take 80 mg by mouth at bedtime.         . pyridOXINE (VITAMIN B-6) 100 MG tablet   Oral   Take 100 mg by mouth every morning.         [name] spironolactone (ALDACTONE) 25 MG tablet   Oral   Take 0.5 tablets (12.5 mg total) by mouth 2 (two) times daily.   30 tablet   6   . Vitamin D, Cholecalciferol, 1000 UNITS TABS   Oral   Take 1,000 tablets by mouth daily.         Penne Lash HFA 45 MCG/ACT inhaler   Inhalation   Inhale 1-2 puffs into the lungs every 6 (six) hours as needed for wheezing or shortness of breath.          . fluticasone (FLONASE) 50 MCG/ACT nasal spray   Nasal   Place 1 spray into the nose 2 (two) times daily as needed for rhinitis or allergies.          . nitroGLYCERIN (NITROSTAT) 0.4 MG SL tablet   Sublingual   Place 0.4 mg under the tongue every 5 (five) minutes as needed for chest pain.          BP 137/85  Pulse 85  Temp(Src) 98.2 F (36.8 C) (Oral)  Resp 18  SpO2 96% Physical Exam  Nursing note and vitals reviewed. Constitutional: She is oriented to person, place, and time. She appears well-developed and well-nourished. No distress.  HENT:  Head: Normocephalic and atraumatic.  Eyes: EOM are normal. Pupils are equal, round, and reactive to light. No scleral icterus.  Neck: Neck supple.  Cardiovascular: Normal rate, regular rhythm and normal heart sounds.   No murmur heard. 1+ pitting edema to bilateral lower extremities, extends 1/3 to pretibia  Pulmonary/Chest: Effort normal and breath sounds normal. She has no wheezes.  Patient is able to speak in complete sentences.     Abdominal: Soft. Bowel sounds are normal. There is no tenderness. There is no rebound and no guarding.  Musculoskeletal: Normal range of motion. She exhibits no edema.  Neurological: She is alert and oriented to person, place, and time.  Skin: Skin is warm and dry. No rash noted.   Psychiatric: She has a normal mood and affect. Her behavior is normal. Thought content normal.    ED Course  Procedures (including critical care time) Labs Review Labs Reviewed  BASIC METABOLIC PANEL - Abnormal; Notable for the following:    Glucose, Bld 105 (*)    GFR calc non Af Amer 61 (*)    GFR calc Af Amer 70 (*)    All other components within normal limits  PRO B NATRIURETIC PEPTIDE - Abnormal; Notable for the following:    Pro B Natriuretic peptide (BNP) 125.1 (*)    All other components within normal limits  CBG MONITORING, ED - Abnormal; Notable for the following:    Glucose-Capillary 103 (*)    All other components within normal limits  CBC  URINALYSIS, ROUTINE W REFLEX MICROSCOPIC  TROPONIN I  TROPONIN I   Imaging Review Dg Chest 2 View  04/15/2013   CLINICAL DATA:  Weakness, hypertension  EXAM: CHEST  2 VIEW  COMPARISON:  11/24/2012  FINDINGS: Cardiomegaly again noted. 4 leads cardiac pacemaker is unchanged in position. No acute infiltrate or pulmonary edema. Mild degenerative changes thoracic spine.  IMPRESSION: No active cardiopulmonary disease.   Electronically Signed   By: Lahoma Crocker  M.D.   On: 04/15/2013 16:29     EKG Date: 04/15/2013  Rate: 69  Rhythm: Ventricular paced complexes  QRS Axis: right  Intervals: paced  ST/T Wave abnormalities: nonspecific ST/T changes  Conduction Disutrbances:paced  Narrative Interpretation:  Old EKG Reviewed: none available   MDM   Final diagnoses:  Near syncope  Shortness of breath   Pt presents after two episodes of near syncope.  She also complains of SOB. PE shows slight lower extremity edema, lungs clear to ausculation. BNP to evaluate. Discussed patient history, condition, and labs with Dr. Rogene Houston who advises if the patient has an pacer with ICD to interrogate the device. EMR shows :Pt with a BS BiV ICD- will interrogate device.Cardiac Cath 07/22/2012 shows: Normal LV function with EF 50 - 55%. Calcification of  LM ostium without stenosis. LAD with 30% Dx1 ostial stenosis, 40 - 50% mid LAD stenosis with systolic bridging, to [id number] focally and 30% in mid Dx2. RCA with 20 - 30% narrowing. CBC without signs of anemia. BMP without concerning electrolyte abnormalities, and Troponin negative.  UA without signs of infection. Interrogation report from [employer] shows no concerning events or defibrillations.  BNP: slightly elevated. Chest XR without signs of CHF.  EKG shows non-specific changes. Discussed patient history, condition, and labs with Dr. Edwyna Ready.lpdiowski who agrees the patient can be evaluated as an out-pt. Pt reports no further symptoms of near syncope in the ED. Discussed lab results, imaging results, and treatment plan with the patient. Return precautions given. Reports understanding and no other concerns at this time.  Patient is stable for discharge at this time.     Lorrine Kin, PA-C 04/18/13 [phone]

## 2013-04-15 NOTE — ED Notes (Signed)
[employer] called to perform defib interrogation, stated that they would be coming but it would take a while before they arrived

## 2013-04-19 ENCOUNTER — Encounter: Payer: Medicare Other | Admitting: Cardiovascular Disease

## 2013-04-22 ENCOUNTER — Ambulatory Visit (INDEPENDENT_AMBULATORY_CARE_PROVIDER_SITE_OTHER): Payer: Medicare Other | Admitting: Cardiovascular Disease

## 2013-04-22 ENCOUNTER — Encounter: Payer: Self-pay | Admitting: Cardiovascular Disease

## 2013-04-22 VITALS — BP 148/92 | HR 67 | Resp 16 | Ht 65.0 in | Wt 243.1 lb

## 2013-04-22 DIAGNOSIS — I428 Other cardiomyopathies: Secondary | ICD-10-CM

## 2013-04-22 DIAGNOSIS — E782 Mixed hyperlipidemia: Secondary | ICD-10-CM | POA: Insufficient documentation

## 2013-04-22 DIAGNOSIS — R42 Dizziness and giddiness: Secondary | ICD-10-CM | POA: Insufficient documentation

## 2013-04-22 DIAGNOSIS — Z950 Presence of cardiac pacemaker: Secondary | ICD-10-CM

## 2013-04-22 DIAGNOSIS — I2510 Atherosclerotic heart disease of native coronary artery without angina pectoris: Secondary | ICD-10-CM

## 2013-04-22 DIAGNOSIS — I10 Essential (primary) hypertension: Secondary | ICD-10-CM

## 2013-04-22 DIAGNOSIS — R0602 Shortness of breath: Secondary | ICD-10-CM

## 2013-04-22 LAB — BRAIN NATRIURETIC PEPTIDE: Brain Natriuretic Peptide: 78.4 pg/mL (ref 0.0–100.0)

## 2013-04-22 LAB — PACEMAKER DEVICE OBSERVATION

## 2013-04-22 NOTE — Assessment & Plan Note (Signed)
BP is slightly high today but when she was in the emergency room it was only 137/85 mm Hg. No changes are made to her medications today.

## 2013-04-22 NOTE — Patient Instructions (Signed)
Your physician recommends that you return for lab work in: Today at [facility].  Remote monitoring is used to monitor your Pacemaker of ICD from home. This monitoring reduces the number of office visits required to check your device to one time per year. It allows [place] to keep an eye on the functioning of your device to ensure it is working properly. You are scheduled for a device check from home on June 18,2915. You may send your transmission at any time that day. If you have a wireless device, the transmission will be sent automatically. After your physician reviews your transmission, you will receive a postcard with your next transmission date.  Your physician recommends that you schedule a follow-up appointment in:  6 months with Dr. [name].

## 2013-04-22 NOTE — Progress Notes (Signed)
Patient ID: [name], female   DOB: 22-May-1945, 68 y.o.   MRN: [id number]       Reason for office visit Nonischemic cardiomyopathy, pacemaker followup  [name] was recently seen in the [facility] emergency room on March 6 for dizziness and shortness of breath. She states that if she lies on her right side she becomes very dizzy when she tries to sit up. This doesn't happen if she rises from a lying on her left side. If she closes her eyes the room spins. When in the emergency room her chest x-ray did not show evidence of congestive heart failure and her pro BNP level was only borderline high at about 125. He was diagnosed with vertigo.  [name] is 68 years old and has a long-standing history of nonischemic cardiomyopathy, hypertension and reactive airway disease. Her most recent echocardiogram was performed less than a year ago and showed normal left ventricular systolic function with an ejection fraction of 55-60%. The mitral inflow suggested normal filling pressures. She had a low risk nuclear scan in October of 2013  She initially received a dual-chamber pacemaker in 2001 for second degree AV block. She then developed congestive heart failure. At one point left ventricular ejection fraction was severely depressed and her pacemaker was upgraded to a CRT-D device in 2008. She was a "hyperresponder" to CRT and EF returned to normal. Sequentially, at the time of her generator change out in 2014 her biventricular defibrillator generator was changed out for a biventricular pacemaker. She has minor coronary atherosclerosis with lesions between 20% and 50% scattered throughout all 3 coronary arteries but does not have a history of myocardial infarction. She is morbidly obese and recently has been told that her glucose is borderline high although she has never been diagnosed with full-blown diabetes mellitus.  Her current device is a [employer], her atrial lead was implanted in  2001 and her ventricular leads were implanted in 2008. A comprehensive check of her device today shows normal function and the absence of any atrial or ventricular tachyarrhythmia. Her underlying rhythm is normal sinus with 2-1 AV block and a ventricular rate of around 30 beats per minute. No programming changes were made to her device today.   Allergies  Allergen Reactions  . Ivp Dye [Iodinated Diagnostic Agents] Shortness Of Breath  . Shellfish-Derived Products Anaphylaxis  . Sulfa Antibiotics Shortness Of Breath  . Iodine Hives    Current Outpatient Prescriptions  Medication Sig Dispense Refill  . albuterol (PROVENTIL HFA;VENTOLIN HFA) 108 (90 BASE) MCG/ACT inhaler Inhale 2 puffs into the lungs every 6 (six) hours as needed for wheezing.      [name] amLODipine (NORVASC) 10 MG tablet Take 10 mg by mouth every evening.       [name] aspirin 81 MG chewable tablet Chew 81 mg by mouth daily.      . budesonide-formoterol (SYMBICORT) 160-4.5 MCG/ACT inhaler Inhale 2 puffs into the lungs 2 (two) times daily.      . busPIRone (BUSPAR) 5 MG tablet Take 5 mg by mouth 2 (two) times daily as needed (for anxiety).       . cetirizine (ZYRTEC) 10 MG tablet Take 10 mg by mouth daily.       [name] docusate sodium (COLACE) 100 MG capsule Take 100-200 mg by mouth at bedtime as needed for constipation.       . fish oil-omega-3 fatty acids 1000 MG capsule Take 3 g by mouth daily.      [name]  fluticasone (FLONASE) 50 MCG/ACT nasal spray Place 1 spray into the nose 2 (two) times daily as needed for rhinitis or allergies.       . furosemide (LASIX) 40 MG tablet Take 40 mg by mouth 2 (two) times daily.        [name] losartan (COZAAR) 100 MG tablet Take 1 tablet (100 mg total) by mouth daily.  60 tablet  6  . metoprolol succinate (TOPROL-XL) 50 MG 24 hr tablet Take 25 mg by mouth 2 (two) times daily.       . nitroGLYCERIN (NITROSTAT) 0.4 MG SL tablet Place 0.4 mg under the tongue every 5 (five) minutes as needed for chest pain.      [name]  omeprazole (PRILOSEC) 20 MG capsule Take 20 mg by mouth 2 (two) times daily. Take 30- 60 min before your first and last meals of the day      . potassium chloride SA (K-DUR,KLOR-CON) 20 MEQ tablet Take 20 mEq by mouth 2 (two) times daily.       . pravastatin (PRAVACHOL) 80 MG tablet Take 80 mg by mouth at bedtime.      . pyridOXINE (VITAMIN B-6) 100 MG tablet Take 100 mg by mouth every morning.      [name] spironolactone (ALDACTONE) 25 MG tablet Take 0.5 tablets (12.5 mg total) by mouth 2 (two) times daily.  30 tablet  6  . Vitamin D, Cholecalciferol, 1000 UNITS TABS Take 1,000 tablets by mouth daily.      [name] HFA 45 MCG/ACT inhaler Inhale 1-2 puffs into the lungs every 6 (six) hours as needed for wheezing or shortness of breath.       . COMBIVENT RESPIMAT 20-100 MCG/ACT AERS respimat as needed.       No current facility-administered medications for this visit.    Past Medical History  Diagnosis Date  . Asthma   . Hypertension   . Arthritis   . GERD (gastroesophageal reflux disease)   . Diverticulosis   . Anemia   . CAD (coronary artery disease)   . Fatty liver disease, nonalcoholic   . Internal hemorrhoids   . Gastritis   . Hyperplastic colon polyp   . Sleep apnea   . ICD (implantable cardiac defibrillator) in place   . Shortness of breath   . IBS (irritable bowel syndrome)   . Anxiety   . CHF (congestive heart failure)   . H/O hiatal hernia   . Obesity   . Back pain     Past Surgical History  Procedure Laterality Date  . Pacemaker insertion  2001    BS BivICD  . Cardiac defibrillator placement    . Appendectomy    . Knee surgery      right  . Ankle surgery      right  . Tubal ligation    . Cervical spine surgery      plate   . Coronary angiogram  2010  . Abdominal ultrasound  12/01/2011    Peripelvic cysts- #1- 2.4x1.9x2.3cm, #2-1.2x0.9x1.2cm  . Cardiac catheterization  05/17/1999    No significant coronary obstructive disease w/ mild 20% luminal irregularity of  the first diag branch of the LAD  . Cardiac catheterization  07/08/2002    No significant CAD, moderately depressed LV systolic function  . Cardiac catheterization Bilateral 04/26/2007    Normal findings, recommend medical therapy  . Cardiac catheterization  02/18/2008    Moderate CAD, would benefit from having a functional study, recommend continue medical therapy  .  Cardiac catheterization  07/23/2012    Medical therapy  . Lexiscan myoview  11/14/2011    Mild-moderate defect seen in Mid Inferolateral and Mid Anterolateral regions-consistant w/ infarct/scar. No significant ischemia demonstrated.  . Transthoracic echocardiogram  07/23/2012    EF 55-60%, normal-mild    Family History  Problem Relation Age of Onset  . Breast cancer Mother   . Diabetes Mother   . Heart disease Maternal Grandmother   . Kidney disease Maternal Grandmother   . Diabetes Maternal Grandmother   . Glaucoma Maternal Aunt   . Heart disease Maternal Aunt   . Asthma Sister     History   Social History  . Marital Status: Widowed    Spouse Name: N/A    Number of Children: 30  . Years of Education: N/A   Occupational History  . 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 Smoking status: Former Smoker -- 0.25 packs/day for 3 years    Types: Cigarettes    Quit date: 02/11/1968  . Smokeless tobacco: Never Used  . Alcohol Use: No  . Drug Use: No  . Sexual Activity: No   Other Topics Concern  . Not on file   Social History Narrative   Widowed last year. She lives with her two sons.    Review of systems: The patient specifically denies any chest pain at rest or with exertion, dyspnea at rest, orthopnea, paroxysmal nocturnal dyspnea, syncope, palpitations, focal neurological deficits, intermittent claudication, lower extremity edema, unexplained weight gain, cough, hemoptysis or wheezing.  The patient also denies abdominal pain, nausea, vomiting, dysphagia, diarrhea, constipation,  polyuria, polydipsia, dysuria, hematuria, frequency, urgency, abnormal bleeding or bruising, fever, chills, unexpected weight changes, mood swings, change in skin or hair texture, change in voice quality, auditory or visual problems, allergic reactions or rashes, new musculoskeletal complaints other than usual "aches and pains".   PHYSICAL EXAM BP 148/92  Pulse 67  Resp 16  Ht 5\' 5"  (1.651 m)  Wt 110.269 kg (243 lb 1.6 oz)  BMI 40.45 kg/m2  General: Alert, oriented x3, no distress Head: no evidence of trauma, PERRL, EOMI, no exophtalmos or lid lag, no myxedema, no xanthelasma; normal ears, nose and oropharynx Neck: normal jugular venous pulsations and no hepatojugular reflux; brisk carotid pulses without delay and no carotid bruits Chest: clear to auscultation, no signs of consolidation by percussion or palpation, normal fremitus, symmetrical and full respiratory excursions Cardiovascular: normal position and quality of the apical impulse, regular rhythm, normal first and second heart sounds, no murmurs, rubs or gallops Abdomen: no tenderness or distention, no masses by palpation, no abnormal pulsatility or arterial bruits, normal bowel sounds, no hepatosplenomegaly Extremities: no clubbing, cyanosis or edema; 2+ radial, ulnar and brachial pulses bilaterally; 2+ right femoral, posterior tibial and dorsalis pedis pulses; 2+ left femoral, posterior tibial and dorsalis pedis pulses; no subclavian or femoral bruits Neurological: grossly nonfocal   EKG: Atrial sensed biventricular paced  Lipid Panel     Component Value Date/Time   CHOL 265* 07/23/2012 0645   TRIG 169* 07/23/2012 0645   HDL 50 07/23/2012 0645   CHOLHDL 5.3 07/23/2012 0645   VLDL 34 07/23/2012 0645   LDLCALC 181* 07/23/2012 0645    BMET    Component Value Date/Time   NA 141 04/15/2013 1434   K 4.6 04/15/2013 1434   CL 103 04/15/2013 1434   CO2 28 04/15/2013 1434   GLUCOSE 105* 04/15/2013 1434   BUN 21 04/15/2013 1434  CREATININE  0.94 04/15/2013 1434   CREATININE 0.93 10/27/2012 1532   CALCIUM 9.5 04/15/2013 1434   GFRNONAA 61* 04/15/2013 1434   GFRAA 70* 04/15/2013 1434     ASSESSMENT AND PLAN Cardiomyopathy, nonischemic, Bi V responder,  EF >55% by 2D Oct 2013 Her recent emergency room evaluation showed no evidence of congestive heart failure. Her BNP was actually lower than it was a year before. (Unfortunately her comparing a pro BNP with a regular BNP of 178 in June of last year). We'll check a comparable study to make sure, but I really don't think her shortness of breath is from congestive heart failure. There is no clinical evidence of hypervolemia either She is on appropriate medications including an angiotensin receptor blocker and beta blocker. Her blood pressure is slightly high today but this is unusual for her and I am reluctant to make changes in her medications based on one abnormal reading. Suspect her dyspnea is related to obesity and untreated obstructive sleep apnea (intolerance to CPAP). I will defer to her pulmonary specialist whether or not repeat pulmonary function testing or any adjustment in her inhalers is necessary.  Unspecified essential hypertension BP is slightly high today but when she was in the emergency room it was only 137/85 mm Hg. No changes are made to her medications today.  Mixed hypercholesterolemia and hypertriglyceridemia Last checked in June of last year her lipid profile was quite unacceptable, but I believe she was not compliant with her statin at that time. We'll plan to recheck later this year. She does have noncritical coronary disease and ideally her LDL cholesterol should be less than 100 if not less than 70 mg/dL  Vertigo The fact that her dizziness is worsened by closing her eyes, occurs only with certain positional changes and is associated with a true sensation of spinning all suggest that she has true vertigo. She does not appear to have significant orthostatic hypotension,  although shot is he takes many medications that could cause this. Have advised that she should seek the advice of her ear nose and throat doctor. If this is unrevealing she might have to see a neurologist.   Orders Placed This Encounter  Procedures  . B Nat Peptide  . EKG 12-Lead   Meds ordered this encounter  Medications  . COMBIVENT RESPIMAT 20-100 MCG/ACT AERS respimat    Sig: as needed.    Holli Humbles, MD, [place] [phone] office [phone] pager

## 2013-04-22 NOTE — Assessment & Plan Note (Addendum)
Normal device function. No changes made to current settings. New Generator [employer] model number V273, serial number [id number]  Right atrial lead (chronic) [employer] Selute , model number F5952493, serial number [id number] (implanted 05/28/1999)  Right ventricular lead (chronic) [employer] Durata IS-1 dual coil, model number U3269403, serial number [id number] (implanted 11/03/2006)  Left ventricular lead Guidant Acuity, model number R235263, serial number [id number] (implanted 11/03/2006)  Explanted generator [employer], serial number [id number] (implanted 11/03/2006)

## 2013-04-22 NOTE — Assessment & Plan Note (Signed)
The fact that her dizziness is worsened by closing her eyes, occurs only with certain positional changes and is associated with a true sensation of spinning all suggest that she has true vertigo. She does not appear to have significant orthostatic hypotension, although shot is he takes many medications that could cause this. Have advised that she should seek the advice of her ear nose and throat doctor. If this is unrevealing she might have to see a neurologist.

## 2013-04-22 NOTE — Assessment & Plan Note (Signed)
Last checked in June of last year her lipid profile was quite unacceptable, but I believe she was not compliant with her statin at that time. We'll plan to recheck later this year. She does have noncritical coronary disease and ideally her LDL cholesterol should be less than 100 if not less than 70 mg/dL

## 2013-04-22 NOTE — Assessment & Plan Note (Addendum)
Her recent emergency room evaluation showed no evidence of congestive heart failure. Her BNP was actually lower than it was a year before. (Unfortunately her comparing a pro BNP with a regular BNP of 178 in June of last year). We'll check a comparable study to make sure, but I really don't think her shortness of breath is from congestive heart failure. There is no clinical evidence of hypervolemia either She is on appropriate medications including an angiotensin receptor blocker and beta blocker. Her blood pressure is slightly high today but this is unusual for her and I am reluctant to make changes in her medications based on one abnormal reading. Suspect her dyspnea is related to obesity and untreated obstructive sleep apnea (intolerance to CPAP). I will defer to her pulmonary specialist whether or not repeat pulmonary function testing or any adjustment in her inhalers is necessary.

## 2013-05-31 ENCOUNTER — Encounter: Payer: Self-pay | Admitting: Cardiovascular Disease

## 2013-07-02 ENCOUNTER — Encounter (HOSPITAL_COMMUNITY): Payer: Self-pay | Admitting: Emergency Medicine

## 2013-07-02 ENCOUNTER — Emergency Department (INDEPENDENT_AMBULATORY_CARE_PROVIDER_SITE_OTHER)
Admission: EM | Admit: 2013-07-02 | Discharge: 2013-07-02 | Disposition: A | Discharge status: Home / Self Care | Payer: Medicare Other | Source: Home / Self Care | Attending: Family Medicine | Admitting: Family Medicine

## 2013-07-02 DIAGNOSIS — J069 Acute upper respiratory infection, unspecified: Secondary | ICD-10-CM

## 2013-07-02 DIAGNOSIS — S29011A Strain of muscle and tendon of front wall of thorax, initial encounter: Secondary | ICD-10-CM

## 2013-07-02 DIAGNOSIS — IMO0002 Reserved for concepts with insufficient information to code with codable children: Secondary | ICD-10-CM

## 2013-07-02 LAB — POCT URINALYSIS DIP (DEVICE)
Glucose, UA: NEGATIVE mg/dL
Hgb urine dipstick: NEGATIVE
Ketones, ur: NEGATIVE mg/dL
Nitrite: NEGATIVE
Protein, ur: NEGATIVE mg/dL
Specific Gravity, Urine: 1.02 (ref 1.005–1.030)
Urobilinogen, UA: 0.2 mg/dL (ref 0.0–1.0)
pH: 5.5 (ref 5.0–8.0)

## 2013-07-02 MED ORDER — TRAMADOL HCL 50 MG PO TABS: 50.0000 mg | ORAL_TABLET | Freq: Four times a day (QID) | ORAL | Status: DC | PRN

## 2013-07-02 NOTE — ED Provider Notes (Signed)
CSN: [id number]     Arrival date & time 07/02/13  1211 History   First MD Initiated Contact with Patient 07/02/13 1252     Chief Complaint  Patient presents with  . Flank Pain  . 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 She began with a cough 5-7 days ago and during a fit of coughing last night she "felt something get pulled in her right lateral chest wall and right upper abdomen. States these areas have remained uncomfortable/painful when she coughs or sneezes. Denies fever States her URI symptoms have caused mild exacerbation of her chronic asthma, but she has been able to control this with her usual medications.  Non-smoker PCP: Dr. Darcus Austin  Patient is a 68 y.o. female presenting with URI. The history is provided by the patient.  URI Presenting symptoms: congestion and cough   Presenting symptoms: no rhinorrhea and no sore throat   Associated symptoms: wheezing   Associated symptoms: no headaches     Past Medical History  Diagnosis Date  . Asthma   . Hypertension   . Arthritis   . GERD (gastroesophageal reflux disease)   . Diverticulosis   . Anemia   . CAD (coronary artery disease)   . Fatty liver disease, nonalcoholic   . Internal hemorrhoids   . Gastritis   . Hyperplastic colon polyp   . Sleep apnea   . ICD (implantable cardiac defibrillator) in place   . Shortness of breath   . IBS (irritable bowel syndrome)   . Anxiety   . CHF (congestive heart failure)   . H/O hiatal hernia   . Obesity   . Back pain    Past Surgical History  Procedure Laterality Date  . Pacemaker insertion  2001    BS BivICD  . Cardiac defibrillator placement    . Appendectomy    . Knee surgery      right  . Ankle surgery      right  . Tubal ligation    . Cervical spine surgery      plate   . Coronary angiogram  2010  .  Abdominal ultrasound  12/01/2011    Peripelvic cysts- #1- 2.4x1.9x2.3cm, #2-1.2x0.9x1.2cm  . Cardiac catheterization  05/17/1999    No significant coronary obstructive disease w/ mild 20% luminal irregularity of the first diag branch of the LAD  . Cardiac catheterization  07/08/2002    No significant CAD, moderately depressed LV systolic function  . Cardiac catheterization Bilateral 04/26/2007    Normal findings, recommend medical therapy  . Cardiac catheterization  02/18/2008    Moderate CAD, would benefit from having a functional study, recommend continue medical therapy  . Cardiac catheterization  07/23/2012    Medical therapy  . Lexiscan myoview  11/14/2011    Mild-moderate defect seen in Mid Inferolateral and Mid Anterolateral regions-consistant w/ infarct/scar. No significant ischemia demonstrated.  . Transthoracic echocardiogram  07/23/2012    EF 55-60%, normal-mild   Family History  Problem Relation Age of Onset  . Breast cancer Mother   . Diabetes Mother   . Heart disease Maternal Grandmother   . Kidney disease Maternal Grandmother   . Diabetes Maternal Grandmother   . Glaucoma Maternal Aunt   . Heart disease Maternal Aunt   . Asthma Sister    History  Substance Use Topics  . Smoking status: Former Smoker -- 0.25 packs/day for 3 years  Types: Cigarettes    Quit date: 02/11/1968  . Smokeless tobacco: Never Used  . Alcohol Use: No   OB History   Grav Para Term Preterm Abortions TAB SAB Ect Mult Living                 Review of Systems  Constitutional: Negative.   HENT: Positive for congestion. Negative for postnasal drip, rhinorrhea and sore throat.   Eyes: Negative.   Respiratory: Positive for cough, chest tightness and wheezing. Negative for shortness of breath and stridor.   Cardiovascular: Negative for chest pain, palpitations and leg swelling.  Gastrointestinal: Negative.   Endocrine: Negative for polydipsia, polyphagia and polyuria.  Genitourinary: Negative.     Musculoskeletal: Positive for back pain.       See HPI  Skin: Negative.   Neurological: Negative for dizziness, syncope, weakness, light-headedness and headaches.    Allergies  Ivp dye; Shellfish-derived products; Sulfa antibiotics; and Iodine  Home Medications   Prior to Admission medications   Medication Sig Start Date End Date Taking? Authorizing Provider  albuterol (PROVENTIL HFA;VENTOLIN HFA) 108 (90 BASE) MCG/ACT inhaler Inhale 2 puffs into the lungs every 6 (six) hours as needed for wheezing.   Yes Historical Provider, MD  amLODipine (NORVASC) 10 MG tablet Take 10 mg by mouth every evening.    Yes Historical Provider, MD  aspirin 81 MG chewable tablet Chew 81 mg by mouth daily.   Yes Historical Provider, MD  budesonide-formoterol (SYMBICORT) 160-4.5 MCG/ACT inhaler Inhale 2 puffs into the lungs 2 (two) times daily.   Yes Historical Provider, MD  cetirizine (ZYRTEC) 10 MG tablet Take 10 mg by mouth daily.    Yes Historical Provider, MD  docusate sodium (COLACE) 100 MG capsule Take 100-200 mg by mouth at bedtime as needed for constipation.    Yes Historical Provider, MD  fish oil-omega-3 fatty acids 1000 MG capsule Take 3 g by mouth daily.   Yes Historical Provider, MD  furosemide (LASIX) 40 MG tablet Take 40 mg by mouth 2 (two) times daily.     Yes Historical Provider, MD  losartan (COZAAR) 100 MG tablet Take 1 tablet (100 mg total) by mouth daily. 03/23/13  Yes [name], MD  metoprolol succinate (TOPROL-XL) 50 MG 24 hr tablet Take 25 mg by mouth 2 (two) times daily.    Yes Historical Provider, MD  omeprazole (PRILOSEC) 20 MG capsule Take 20 mg by mouth 2 (two) times daily. Take 30- 60 min before your first and last meals of the day 08/25/12  Yes Tanda Rockers, MD  potassium chloride SA (K-DUR,KLOR-CON) 20 MEQ tablet Take 20 mEq by mouth 2 (two) times daily.    Yes Historical Provider, MD  pravastatin (PRAVACHOL) 80 MG tablet Take 80 mg by mouth at bedtime.   Yes Historical  Provider, MD  spironolactone (ALDACTONE) 25 MG tablet Take 0.5 tablets (12.5 mg total) by mouth 2 (two) times daily. 12/29/12  Yes Lorretta Harp, MD  XOPENEX HFA 45 MCG/ACT inhaler Inhale 1-2 puffs into the lungs every 6 (six) hours as needed for wheezing or shortness of breath.  03/17/13  Yes Historical Provider, MD  busPIRone (BUSPAR) 5 MG tablet Take 5 mg by mouth 2 (two) times daily as needed (for anxiety).     Historical Provider, MD  COMBIVENT RESPIMAT 20-100 MCG/ACT AERS respimat as needed. 02/18/13   Historical Provider, MD  fluticasone (FLONASE) 50 MCG/ACT nasal spray Place 1 spray into the nose 2 (two) times daily as needed  for rhinitis or allergies.     Historical Provider, MD  nitroGLYCERIN (NITROSTAT) 0.4 MG SL tablet Place 0.4 mg under the tongue every 5 (five) minutes as needed for chest pain.    Historical Provider, MD  pyridOXINE (VITAMIN B-6) 100 MG tablet Take 100 mg by mouth every morning.    Historical Provider, MD  Vitamin D, Cholecalciferol, 1000 UNITS TABS Take 1,000 tablets by mouth daily.    Historical Provider, MD   BP 145/87  Pulse 81  Temp(Src) 98.3 F (36.8 C) (Oral)  Resp 18  SpO2 96% Physical Exam  Nursing note and vitals reviewed. Constitutional: She is oriented to person, place, and time. She appears well-developed and well-nourished.  +obese  HENT:  Head: Normocephalic and atraumatic.  Eyes: Conjunctivae are normal. Right eye exhibits no discharge. Left eye exhibits no discharge. No scleral icterus.  Neck: Normal range of motion. Neck supple.  Cardiovascular: Normal rate, regular rhythm and normal heart sounds.   Pulmonary/Chest: Effort normal and breath sounds normal. No stridor. No respiratory distress. She has no wheezes.    Abdominal: Soft. Bowel sounds are normal. She exhibits no distension. There is no tenderness.  Musculoskeletal: Normal range of motion. She exhibits no edema and no tenderness.  Lymphadenopathy:    She has no cervical  adenopathy.  Neurological: She is alert and oriented to person, place, and time.  Skin: Skin is warm and dry. No rash noted. No erythema.  Psychiatric: She has a normal mood and affect. Her behavior is normal.    ED Course  Procedures (including critical care time) Labs Review Labs Reviewed  POCT URINALYSIS DIP (DEVICE) - Abnormal; Notable for the following:    Bilirubin Urine SMALL (*)    Leukocytes, UA SMALL (*)    All other components within normal limits  URINE CULTURE    Imaging Review No results found.   MDM   1. URI (upper respiratory infection)   2. Chest wall muscle strain    URI with right lateral chest wall myofascial strain secondary to coughing. No clinic evidence of CAP or bronchospasm. Will treat with tramadol for both cough suppression and pain management and advise follow up with PCP if no improvement.     [place], [place] 07/02/13 1359

## 2013-07-02 NOTE — ED Provider Notes (Signed)
Medical screening examination/treatment/procedure(s) were performed by resident physician or non-physician practitioner and as supervising physician I was immediately available for consultation/collaboration.   Pauline Good MD.   Billy Fischer, MD 07/02/13 2000

## 2013-07-02 NOTE — Discharge Instructions (Signed)
Cough, Adult  A cough is a reflex that helps clear your throat and airways. It can help heal the body or may be a reaction to an irritated airway. A cough may only last 2 or 3 weeks (acute) or may last more than 8 weeks (chronic).  CAUSES Acute cough:  Viral or bacterial infections. Chronic cough:  Infections.  Allergies.  Asthma.  Post-nasal drip.  Smoking.  Heartburn or acid reflux.  Some medicines.  Chronic lung problems (COPD).  Cancer. SYMPTOMS   Cough.  Fever.  Chest pain.  Increased breathing rate.  High-pitched whistling sound when breathing (wheezing).  Colored mucus that you cough up (sputum). TREATMENT   A bacterial cough may be treated with antibiotic medicine.  A viral cough must run its course and will not respond to antibiotics.  Your caregiver may recommend other treatments if you have a chronic cough. HOME CARE INSTRUCTIONS   Only take over-the-counter or prescription medicines for pain, discomfort, or fever as directed by your caregiver. Use cough suppressants only as directed by your caregiver.  Use a cold steam vaporizer or humidifier in your bedroom or home to help loosen secretions.  Sleep in a semi-upright position if your cough is worse at night.  Rest as needed.  Stop smoking if you smoke. SEEK IMMEDIATE MEDICAL CARE IF:   You have pus in your sputum.  Your cough starts to worsen.  You cannot control your cough with suppressants and are losing sleep.  You begin coughing up blood.  You have difficulty breathing.  You develop pain which is getting worse or is uncontrolled with medicine.  You have a fever. MAKE SURE YOU:   Understand these instructions.  Will watch your condition.  Will get help right away if you are not doing well or get worse. Document Released: 07/26/2010 Document Revised: 04/21/2011 Document Reviewed: 07/26/2010 Exit[employer] Patient Information 2014 [employer]  Upper Respiratory Infection,  Adult An upper respiratory infection (URI) is also sometimes known as the common cold. The upper respiratory tract includes the nose, sinuses, throat, trachea, and bronchi. Bronchi are the airways leading to the lungs. Most people improve within 1 week, but symptoms can last up to 2 weeks. A residual cough may last even longer.  CAUSES Many different viruses can infect the tissues lining the upper respiratory tract. The tissues become irritated and inflamed and often become very moist. Mucus production is also common. A cold is contagious. You can easily spread the virus to others by oral contact. This includes kissing, sharing a glass, coughing, or sneezing. Touching your mouth or nose and then touching a surface, which is then touched by another person, can also spread the virus. SYMPTOMS  Symptoms typically develop 1 to 3 days after you come in contact with a cold virus. Symptoms vary from person to person. They may include:  Runny nose.  Sneezing.  Nasal congestion.  Sinus irritation.  Sore throat.  Loss of voice (laryngitis).  Cough.  Fatigue.  Muscle aches.  Loss of appetite.  Headache.  Low-grade fever. DIAGNOSIS  You might diagnose your own cold based on familiar symptoms, since most people get a cold 2 to 3 times a year. Your caregiver can confirm this based on your exam. Most importantly, your caregiver can check that your symptoms are not due to another disease such as strep throat, sinusitis, pneumonia, asthma, or epiglottitis. Blood tests, throat tests, and X-rays are not necessary to diagnose a common cold, but they may sometimes be helpful   in excluding other more serious diseases. Your caregiver will decide if any further tests are required. RISKS AND COMPLICATIONS  You may be at risk for a more severe case of the common cold if you smoke cigarettes, have chronic heart disease (such as heart failure) or lung disease (such as asthma), or if you have a weakened immune  system. The very young and very old are also at risk for more serious infections. Bacterial sinusitis, middle ear infections, and bacterial pneumonia can complicate the common cold. The common cold can worsen asthma and chronic obstructive pulmonary disease (COPD). Sometimes, these complications can require emergency medical care and may be life-threatening. PREVENTION  The best way to protect against getting a cold is to practice good hygiene. Avoid oral or hand contact with people with cold symptoms. Wash your hands often if contact occurs. There is no clear evidence that vitamin C, vitamin E, echinacea, or exercise reduces the chance of developing a cold. However, it is always recommended to get plenty of rest and practice good nutrition. TREATMENT  Treatment is directed at relieving symptoms. There is no cure. Antibiotics are not effective, because the infection is caused by a virus, not by bacteria. Treatment may include:  Increased fluid intake. Sports drinks offer valuable electrolytes, sugars, and fluids.  Breathing heated mist or steam (vaporizer or shower).  Eating chicken soup or other clear broths, and maintaining good nutrition.  Getting plenty of rest.  Using gargles or lozenges for comfort.  Controlling fevers with ibuprofen or acetaminophen as directed by your caregiver.  Increasing usage of your inhaler if you have asthma. Zinc gel and zinc lozenges, taken in the first 24 hours of the common cold, can shorten the duration and lessen the severity of symptoms. Pain medicines may help with fever, muscle aches, and throat pain. A variety of non-prescription medicines are available to treat congestion and runny nose. Your caregiver can make recommendations and may suggest nasal or lung inhalers for other symptoms.  HOME CARE INSTRUCTIONS   Only take over-the-counter or prescription medicines for pain, discomfort, or fever as directed by your caregiver.  Use a warm mist humidifier  or inhale steam from a shower to increase air moisture. This may keep secretions moist and make it easier to breathe.  Drink enough water and fluids to keep your urine clear or pale yellow.  Rest as needed.  Return to work when your temperature has returned to normal or as your caregiver advises. You may need to stay home longer to avoid infecting others. You can also use a face mask and careful hand washing to prevent spread of the virus. SEEK MEDICAL CARE IF:   After the first few days, you feel you are getting worse rather than better.  You need your caregiver's advice about medicines to control symptoms.  You develop chills, worsening shortness of breath, or brown or red sputum. These may be signs of pneumonia.  You develop yellow or brown nasal discharge or pain in the face, especially when you bend forward. These may be signs of sinusitis.  You develop a fever, swollen neck glands, pain with swallowing, or white areas in the back of your throat. These may be signs of strep throat. SEEK IMMEDIATE MEDICAL CARE IF:   You have a fever.  You develop severe or persistent headache, ear pain, sinus pain, or chest pain.  You develop wheezing, a prolonged cough, cough up blood, or have a change in your usual mucus (if you have chronic   lung disease).  You develop sore muscles or a stiff neck. Document Released: 07/23/2000 Document Revised: 04/21/2011 Document Reviewed: 05/31/2010 Exit[employer] Patient Information 2014 [employer].  

## 2013-07-02 NOTE — ED Notes (Signed)
Patient complains of head and chest congestion x 1 week; also states right sided pain that started last night. Denies f/c.

## 2013-07-03 LAB — URINE CULTURE
Colony Count: 4000
Special Requests: NORMAL

## 2013-07-06 ENCOUNTER — Other Ambulatory Visit: Payer: Self-pay | Admitting: *Deleted

## 2013-07-06 MED ORDER — METOPROLOL SUCCINATE ER 25 MG PO TB24: 25.0000 mg | ORAL_TABLET | Freq: Two times a day (BID) | ORAL | Status: DC

## 2013-07-06 NOTE — Telephone Encounter (Signed)
Rx refill sent to patient pharmacy   

## 2013-07-28 ENCOUNTER — Ambulatory Visit (INDEPENDENT_AMBULATORY_CARE_PROVIDER_SITE_OTHER): Payer: Medicare Other | Admitting: *Deleted

## 2013-07-28 ENCOUNTER — Telehealth: Payer: Self-pay | Admitting: Cardiology

## 2013-07-28 ENCOUNTER — Encounter: Payer: Self-pay | Admitting: Cardiovascular Disease

## 2013-07-28 DIAGNOSIS — I509 Heart failure, unspecified: Secondary | ICD-10-CM

## 2013-07-28 DIAGNOSIS — I428 Other cardiomyopathies: Secondary | ICD-10-CM

## 2013-07-28 LAB — MDC_IDC_ENUM_SESS_TYPE_REMOTE
Battery Remaining Longevity: 113 mo
Brady Statistic RA Percent Paced: 35 %
Brady Statistic RV Percent Paced: 100 %
Implantable Pulse Generator Serial Number: 100731
Lead Channel Impedance Value: 1552 Ohm
Lead Channel Impedance Value: 654 Ohm
Lead Channel Impedance Value: 908 Ohm
Lead Channel Sensing Intrinsic Amplitude: 5.5 mV
Lead Channel Setting Pacing Amplitude: 2 V
Lead Channel Setting Pacing Amplitude: 2 V
Lead Channel Setting Pacing Amplitude: 2.4 V
Lead Channel Setting Pacing Pulse Width: 0.4 ms
Lead Channel Setting Pacing Pulse Width: 0.8 ms
Lead Channel Setting Sensing Sensitivity: 2.5 mV
Lead Channel Setting Sensing Sensitivity: 2.5 mV
Zone Setting Detection Interval: 375 ms

## 2013-07-28 NOTE — Progress Notes (Signed)
Remote pacemaker transmission.   

## 2013-07-28 NOTE — Telephone Encounter (Signed)
Spoke with pt and reminded pt of remote transmission that is due today. Pt verbalized understanding.   

## 2013-08-11 ENCOUNTER — Other Ambulatory Visit: Payer: Self-pay | Admitting: Cardiology

## 2013-08-11 NOTE — Telephone Encounter (Signed)
Rx was sent to pharmacy electronically. 

## 2013-08-15 ENCOUNTER — Other Ambulatory Visit: Payer: Self-pay | Admitting: *Deleted

## 2013-08-15 MED ORDER — LOSARTAN POTASSIUM 100 MG PO TABS: 100.0000 mg | ORAL_TABLET | Freq: Every day | ORAL | Status: DC

## 2013-08-15 NOTE — Telephone Encounter (Signed)
Rx was sent to pharmacy electronically. 

## 2013-08-18 ENCOUNTER — Emergency Department (HOSPITAL_COMMUNITY): Payer: Medicare Other

## 2013-08-18 ENCOUNTER — Emergency Department (HOSPITAL_COMMUNITY)
Admission: EM | Admit: 2013-08-18 | Discharge: 2013-08-18 | Disposition: A | Discharge status: Home / Self Care | Payer: Medicare Other | Attending: Emergency Medicine | Admitting: Emergency Medicine

## 2013-08-18 ENCOUNTER — Encounter (HOSPITAL_COMMUNITY): Payer: Self-pay | Admitting: Emergency Medicine

## 2013-08-18 DIAGNOSIS — Z79899 Other long term (current) drug therapy: Secondary | ICD-10-CM | POA: Diagnosis not present

## 2013-08-18 DIAGNOSIS — J45901 Unspecified asthma with (acute) exacerbation: Secondary | ICD-10-CM | POA: Insufficient documentation

## 2013-08-18 DIAGNOSIS — F411 Generalized anxiety disorder: Secondary | ICD-10-CM | POA: Diagnosis not present

## 2013-08-18 DIAGNOSIS — I2510 Atherosclerotic heart disease of native coronary artery without angina pectoris: Secondary | ICD-10-CM | POA: Insufficient documentation

## 2013-08-18 DIAGNOSIS — K219 Gastro-esophageal reflux disease without esophagitis: Secondary | ICD-10-CM | POA: Diagnosis not present

## 2013-08-18 DIAGNOSIS — Z7982 Long term (current) use of aspirin: Secondary | ICD-10-CM | POA: Insufficient documentation

## 2013-08-18 DIAGNOSIS — R05 Cough: Secondary | ICD-10-CM

## 2013-08-18 DIAGNOSIS — Z792 Long term (current) use of antibiotics: Secondary | ICD-10-CM | POA: Insufficient documentation

## 2013-08-18 DIAGNOSIS — R059 Cough, unspecified: Secondary | ICD-10-CM

## 2013-08-18 DIAGNOSIS — Z860100 Personal history of colon polyps, unspecified: Secondary | ICD-10-CM | POA: Insufficient documentation

## 2013-08-18 DIAGNOSIS — Z87891 Personal history of nicotine dependence: Secondary | ICD-10-CM | POA: Diagnosis not present

## 2013-08-18 DIAGNOSIS — E669 Obesity, unspecified: Secondary | ICD-10-CM | POA: Diagnosis not present

## 2013-08-18 DIAGNOSIS — Z862 Personal history of diseases of the blood and blood-forming organs and certain disorders involving the immune mechanism: Secondary | ICD-10-CM | POA: Insufficient documentation

## 2013-08-18 DIAGNOSIS — I509 Heart failure, unspecified: Secondary | ICD-10-CM | POA: Diagnosis not present

## 2013-08-18 DIAGNOSIS — Z9889 Other specified postprocedural states: Secondary | ICD-10-CM | POA: Insufficient documentation

## 2013-08-18 DIAGNOSIS — Z95810 Presence of automatic (implantable) cardiac defibrillator: Secondary | ICD-10-CM | POA: Diagnosis not present

## 2013-08-18 DIAGNOSIS — R079 Chest pain, unspecified: Secondary | ICD-10-CM | POA: Diagnosis present

## 2013-08-18 DIAGNOSIS — M129 Arthropathy, unspecified: Secondary | ICD-10-CM | POA: Diagnosis not present

## 2013-08-18 DIAGNOSIS — I10 Essential (primary) hypertension: Secondary | ICD-10-CM | POA: Diagnosis not present

## 2013-08-18 DIAGNOSIS — IMO0002 Reserved for concepts with insufficient information to code with codable children: Secondary | ICD-10-CM | POA: Diagnosis not present

## 2013-08-18 LAB — BASIC METABOLIC PANEL
Anion gap: 13 (ref 5–15)
BUN: 27 mg/dL — ABNORMAL HIGH (ref 6–23)
CO2: 26 mEq/L (ref 19–32)
Calcium: 9.7 mg/dL (ref 8.4–10.5)
Chloride: 100 mEq/L (ref 96–112)
Creatinine, Ser: 0.98 mg/dL (ref 0.50–1.10)
GFR calc Af Amer: 67 mL/min — ABNORMAL LOW (ref >90)
GFR calc non Af Amer: 58 mL/min — ABNORMAL LOW (ref >90)
Glucose, Bld: 152 mg/dL — ABNORMAL HIGH (ref 70–99)
Potassium: 5.1 mEq/L (ref 3.7–5.3)
Sodium: 139 mEq/L (ref 137–147)

## 2013-08-18 LAB — CBC
HCT: 48.1 % — ABNORMAL HIGH (ref 36.0–46.0)
Hemoglobin: 15.6 g/dL — ABNORMAL HIGH (ref 12.0–15.0)
MCH: 28.3 pg (ref 26.0–34.0)
MCHC: 32.4 g/dL (ref 30.0–36.0)
MCV: 87.3 fL (ref 78.0–100.0)
Platelets: 202 10*3/uL (ref 150–400)
RBC: 5.51 MIL/uL — ABNORMAL HIGH (ref 3.87–5.11)
RDW: 14.1 % (ref 11.5–15.5)
WBC: 12.9 10*3/uL — ABNORMAL HIGH (ref 4.0–10.5)

## 2013-08-18 LAB — I-STAT TROPONIN, ED: Troponin i, poc: 0 ng/mL (ref 0.00–0.08)

## 2013-08-18 LAB — D-DIMER, QUANTITATIVE: D-Dimer, Quant: 0.96 ug/mL-FEU — ABNORMAL HIGH (ref 0.00–0.48)

## 2013-08-18 LAB — PRO B NATRIURETIC PEPTIDE: Pro B Natriuretic peptide (BNP): 193.1 pg/mL — ABNORMAL HIGH (ref 0–125)

## 2013-08-18 MED ORDER — TECHNETIUM TC 99M DIETHYLENETRIAME-PENTAACETIC ACID
43.4000 | Freq: Once | INTRAVENOUS | Status: DC | PRN
Start: 2013-08-18 — End: 2013-08-19

## 2013-08-18 MED ORDER — MORPHINE SULFATE 4 MG/ML IJ SOLN
4.0000 mg | Freq: Once | INTRAMUSCULAR | Status: AC
  Administered 2013-08-18: 4 mg via INTRAVENOUS
  Filled 2013-08-18: qty 1

## 2013-08-18 MED ORDER — TECHNETIUM TO 99M ALBUMIN AGGREGATED
5.3000 | Freq: Once | INTRAVENOUS | Status: AC | PRN
  Administered 2013-08-18: 5.3 via INTRAVENOUS

## 2013-08-18 NOTE — ED Notes (Signed)
Pt c/o lower left chest pain radiating to left back, worsening of chronic SOB, pt states Hx of chest pain with asthma flare-up.

## 2013-08-18 NOTE — Discharge Instructions (Signed)
°Chest Pain (Nonspecific) °It is often hard to give a specific diagnosis for the cause of chest pain. There is always a chance that your pain could be related to something serious, such as a heart attack or a blood clot in the lungs. You need to follow up with your health care provider for further evaluation. °CAUSES  °· Heartburn. °· Pneumonia or bronchitis. °· Anxiety or stress. °· Inflammation around your heart (pericarditis) or lung (pleuritis or pleurisy). °· A blood clot in the lung. °· A collapsed lung (pneumothorax). It can develop suddenly on its own (spontaneous pneumothorax) or from trauma to the chest. °· Shingles infection (herpes zoster virus). °The chest wall is composed of bones, muscles, and cartilage. Any of these can be the source of the pain. °· The bones can be bruised by injury. °· The muscles or cartilage can be strained by coughing or overwork. °· The cartilage can be affected by inflammation and become sore (costochondritis). °DIAGNOSIS  °Lab tests or other studies may be needed to find the cause of your pain. Your health care provider may have you take a test called an ambulatory electrocardiogram (ECG). An ECG records your heartbeat patterns over a 24-hour period. You may also have other tests, such as: °· Transthoracic echocardiogram (TTE). During echocardiography, sound waves are used to evaluate how blood flows through your heart. °· Transesophageal echocardiogram (TEE). °· Cardiac monitoring. This allows your health care provider to monitor your heart rate and rhythm in real time. °· Holter monitor. This is a portable device that records your heartbeat and can help diagnose heart arrhythmias. It allows your health care provider to track your heart activity for several days, if needed. °· Stress tests by exercise or by giving medicine that makes the heart beat faster. °TREATMENT  °· Treatment depends on what may be causing your chest pain. Treatment may include: °¨ Acid blockers for  heartburn. °¨ Anti-inflammatory medicine. °¨ Pain medicine for inflammatory conditions. °¨ Antibiotics if an infection is present. °· You may be advised to change lifestyle habits. This includes stopping smoking and avoiding alcohol, caffeine, and chocolate. °· You may be advised to keep your head raised (elevated) when sleeping. This reduces the chance of acid going backward from your stomach into your esophagus. °Most of the time, nonspecific chest pain will improve within 2-3 days with rest and mild pain medicine.  °HOME CARE INSTRUCTIONS  °· If antibiotics were prescribed, take them as directed. Finish them even if you start to feel better. °· For the next few days, avoid physical activities that bring on chest pain. Continue physical activities as directed. °· Do not use any tobacco products, including cigarettes, chewing tobacco, or electronic cigarettes. °· Avoid drinking alcohol. °· Only take medicine as directed by your health care provider. °· Follow your health care provider's suggestions for further testing if your chest pain does not go away. °· Keep any follow-up appointments you made. If you do not go to an appointment, you could develop lasting (chronic) problems with pain. If there is any problem keeping an appointment, call to reschedule. °SEEK MEDICAL CARE IF:  °· Your chest pain does not go away, even after treatment. °· You have a rash with blisters on your chest. °· You have a fever. °SEEK IMMEDIATE MEDICAL CARE IF:  °· You have increased chest pain or pain that spreads to your arm, neck, jaw, back, or abdomen. °· You have shortness of breath. °· You have an increasing cough, or you cough   up blood. °· You have severe back or abdominal pain. °· You feel nauseous or vomit. °· You have severe weakness. °· You faint. °· You have chills. °This is an emergency. Do not wait to see if the pain will go away. Get medical help at once. Call your local emergency services (911 in [place] Do not drive  yourself to the hospital. °MAKE SURE YOU:  °· Understand these instructions. °· Will watch your condition. °· Will get help right away if you are not doing well or get worse. °Document Released: 11/06/2004 Document Revised: 02/01/2013 Document Reviewed: 09/02/2007 °[employer]® Patient Information ©2015 [employer]. This information is not intended to replace advice given to you by your health care provider. Make sure you discuss any questions you have with your health care provider. °Cough, Adult ° A cough is a reflex that helps clear your throat and airways. It can help heal the body or may be a reaction to an irritated airway. A cough may only last 2 or 3 weeks (acute) or may last more than 8 weeks (chronic).  °CAUSES °Acute cough: °· Viral or bacterial infections. °Chronic cough: °· Infections. °· Allergies. °· Asthma. °· Post-nasal drip. °· Smoking. °· Heartburn or acid reflux. °· Some medicines. °· Chronic lung problems (COPD). °· Cancer. °SYMPTOMS  °· Cough. °· Fever. °· Chest pain. °· Increased breathing rate. °· High-pitched whistling sound when breathing (wheezing). °· Colored mucus that you cough up (sputum). °TREATMENT  °· A bacterial cough may be treated with antibiotic medicine. °· A viral cough must run its course and will not respond to antibiotics. °· Your caregiver may recommend other treatments if you have a chronic cough. °HOME CARE INSTRUCTIONS  °· Only take over-the-counter or prescription medicines for pain, discomfort, or fever as directed by your caregiver. Use cough suppressants only as directed by your caregiver. °· Use a cold steam vaporizer or humidifier in your bedroom or home to help loosen secretions. °· Sleep in a semi-upright position if your cough is worse at night. °· Rest as needed. °· Stop smoking if you smoke. °SEEK IMMEDIATE MEDICAL CARE IF:  °· You have pus in your sputum. °· Your cough starts to worsen. °· You cannot control your cough with suppressants and are losing  sleep. °· You begin coughing up blood. °· You have difficulty breathing. °· You develop pain which is getting worse or is uncontrolled with medicine. °· You have a fever. °MAKE SURE YOU:  °· Understand these instructions. °· Will watch your condition. °· Will get help right away if you are not doing well or get worse. °Document Released: 07/26/2010 Document Revised: 04/21/2011 Document Reviewed: 07/26/2010 °[employer]Care® Patient Information ©2015 [employer]. This information is not intended to replace advice given to you by your health care provider. Make sure you discuss any questions you have with your health care provider. ° °

## 2013-08-18 NOTE — ED Notes (Signed)
Assumed care of patient Patient able to ambulate to and from bathroom without nursing staff assistance Minimal to NO SOB/DOE during or after ambulation from bathroom Patient in NAD Call bell in reach

## 2013-08-18 NOTE — ED Provider Notes (Signed)
Care assumed from Dr. Venora Maples with V/Q scan pending.  V/Q scan shows no perfusion defects.  It shows a mild ventilation and perfusion decrease, but CT scan was negative.  On recheck, well appearing, no respiratory distress.  All in all, she thinks her URI symptoms and cough are improving after being treated with azithromycin and prednisone by her PCP.  Advised continued use of inhaler and PCP follow up.   Clinical Impression: 1. Cough   2. Chest pain, unspecified chest pain type       Houston Siren III, MD 08/18/13 2048

## 2013-08-18 NOTE — ED Provider Notes (Signed)
CSN: [id number]     Arrival date & time 08/18/13  1140 History   First MD Initiated Contact with Patient 08/18/13 1148     Chief Complaint  Patient presents with  . Chest Pain  . Shortness of Breath      The history is provided by the patient.   patient states worsening sharp left-sided chest pain with radiation in towards her left scapular region over the past several days.  She has a pleuritic component to the discomfort and pain.  She has no exertional shortness of breath.  She denies orthopnea.  She has a history of coronary artery disease as well as asthma.  She was treated 2 weeks ago for suspected bronchitis with steroids and azithromycin she felt as though she improved and then several days ago began having upper respiratory symptoms again with some coughing congestion.  She does have a history congestive heart failure as well.  She has a ICD in place.  No prior history of DVT or pulmonary embolism.  Denies leg swelling.  Denies orthopnea.  Past Medical History  Diagnosis Date  . Asthma   . Hypertension   . Arthritis   . GERD (gastroesophageal reflux disease)   . Diverticulosis   . Anemia   . CAD (coronary artery disease)   . Fatty liver disease, nonalcoholic   . Internal hemorrhoids   . Gastritis   . Hyperplastic colon polyp   . Sleep apnea   . ICD (implantable cardiac defibrillator) in place   . Shortness of breath   . IBS (irritable bowel syndrome)   . Anxiety   . CHF (congestive heart failure)   . H/O hiatal hernia   . Obesity   . Back pain    Past Surgical History  Procedure Laterality Date  . Pacemaker insertion  2001    BS BivICD  . Cardiac defibrillator placement    . Appendectomy    . Knee surgery      right  . Ankle surgery      right  . Tubal ligation    . Cervical spine surgery      plate   . Coronary angiogram  2010  . Abdominal ultrasound  12/01/2011    Peripelvic cysts- #1- 2.4x1.9x2.3cm, #2-1.2x0.9x1.2cm  . Cardiac catheterization  05/17/1999     No significant coronary obstructive disease w/ mild 20% luminal irregularity of the first diag branch of the LAD  . Cardiac catheterization  07/08/2002    No significant CAD, moderately depressed LV systolic function  . Cardiac catheterization Bilateral 04/26/2007    Normal findings, recommend medical therapy  . Cardiac catheterization  02/18/2008    Moderate CAD, would benefit from having a functional study, recommend continue medical therapy  . Cardiac catheterization  07/23/2012    Medical therapy  . Lexiscan myoview  11/14/2011    Mild-moderate defect seen in Mid Inferolateral and Mid Anterolateral regions-consistant w/ infarct/scar. No significant ischemia demonstrated.  . Transthoracic echocardiogram  07/23/2012    EF 55-60%, normal-mild   Family History  Problem Relation Age of Onset  . Breast cancer Mother   . Diabetes Mother   . Heart disease Maternal Grandmother   . Kidney disease Maternal Grandmother   . Diabetes Maternal Grandmother   . Glaucoma Maternal Aunt   . Heart disease Maternal Aunt   . Asthma Sister    History  Substance Use Topics  . Smoking status: Former Smoker -- 0.25 packs/day for 3 years    Types: Cigarettes  Quit date: 02/11/1968  . Smokeless tobacco: Never Used  . Alcohol Use: No   OB History   Grav Para Term Preterm Abortions TAB SAB Ect Mult Living                 Review of Systems  Respiratory: Positive for shortness of breath.   Cardiovascular: Positive for chest pain.  All other systems reviewed and are negative.     Allergies  Ivp dye; Shellfish-derived products; Sulfa antibiotics; and Iodine  Home Medications   Prior to Admission medications   Medication Sig Start Date End Date Taking? Authorizing Provider  albuterol (PROVENTIL HFA;VENTOLIN HFA) 108 (90 BASE) MCG/ACT inhaler Inhale 2 puffs into the lungs every 4 (four) hours as needed for wheezing.    Yes Historical Provider, MD  amLODipine (NORVASC) 10 MG tablet Take 10 mg by  mouth daily.   Yes Historical Provider, MD  aspirin 81 MG chewable tablet Chew 81 mg by mouth daily.   Yes Historical Provider, MD  azithromycin (ZITHROMAX) 250 MG tablet Take 250 mg by mouth See admin instructions. Take two by mouth on first day, then one tab daily for 4 days.   Yes Historical Provider, MD  budesonide-formoterol (SYMBICORT) 160-4.5 MCG/ACT inhaler Inhale 2 puffs into the lungs 2 (two) times daily.   Yes Historical Provider, MD  cetirizine (ZYRTEC) 10 MG tablet Take 10 mg by mouth daily.    Yes Historical Provider, MD  COMBIVENT RESPIMAT 20-100 MCG/ACT AERS respimat Inhale 2 puffs into the lungs as needed for wheezing or shortness of breath.  02/18/13  Yes Historical Provider, MD  fexofenadine (ALLEGRA) 180 MG tablet Take 180 mg by mouth daily.   Yes Historical Provider, MD  fluticasone (FLONASE) 50 MCG/ACT nasal spray Place 1 spray into the nose 2 (two) times daily as needed for rhinitis or allergies.    Yes Historical Provider, MD  furosemide (LASIX) 40 MG tablet Take 40 mg by mouth 2 (two) times daily.    Yes Historical Provider, MD  losartan (COZAAR) 100 MG tablet Take 1 tablet (100 mg total) by mouth daily. 08/15/13  Yes [name], MD  metoprolol succinate (TOPROL-XL) 50 MG 24 hr tablet Take 75 mg by mouth daily. Take with or immediately following a meal.   Yes Historical Provider, MD  nitroGLYCERIN (NITROSTAT) 0.4 MG SL tablet Place 0.4 mg under the tongue every 5 (five) minutes as needed for chest pain.   Yes Historical Provider, MD  omeprazole (PRILOSEC) 40 MG capsule Take 40 mg by mouth daily.   Yes Historical Provider, MD  potassium chloride SA (K-DUR,KLOR-CON) 20 MEQ tablet Take 20 mEq by mouth 2 (two) times daily.    Yes Historical Provider, MD  pravastatin (PRAVACHOL) 80 MG tablet Take 80 mg by mouth daily.    Yes Historical Provider, MD  predniSONE (DELTASONE) 20 MG tablet Take 20 mg by mouth See admin instructions. Take 3 tabs x3 days, 2 tabs x3 days, 1 tab x3 days.  08/11/13 08/20/13 Yes Historical Provider, MD  pyridOXINE (VITAMIN B-6) 100 MG tablet Take 100 mg by mouth every morning.   Yes Historical Provider, MD  spironolactone (ALDACTONE) 25 MG tablet Take 0.5 tablets (12.5 mg total) by mouth 2 (two) times daily. 12/29/12  Yes Lorretta Harp, MD  traMADol (ULTRAM) 50 MG tablet Take 1 tablet (50 mg total) by mouth every 6 (six) hours as needed for moderate pain (and cough suppression). 07/02/13  Yes Annett Gula Presson, PA   BP 126/85  Pulse 76  Temp(Src) 98.1 F (36.7 C) (Oral)  Resp 18  SpO2 94% Physical Exam  Nursing note and vitals reviewed. Constitutional: She is oriented to person, place, and time. She appears well-developed and well-nourished. No distress.  HENT:  Head: Normocephalic and atraumatic.  Eyes: EOM are normal.  Neck: Normal range of motion.  Cardiovascular: Normal rate, regular rhythm and normal heart sounds.   Pulmonary/Chest: Effort normal and breath sounds normal.  Rhonchi bilateral bases  Abdominal: Soft. She exhibits no distension. There is no tenderness.  Musculoskeletal: Normal range of motion. She exhibits no edema and no tenderness.  Neurological: She is alert and oriented to person, place, and time.  Skin: Skin is warm and dry.  Psychiatric: She has a normal mood and affect. Judgment normal.    ED Course  Procedures (including critical care time) Labs Review Labs Reviewed  CBC - Abnormal; Notable for the following:    WBC 12.9 (*)    RBC 5.51 (*)    Hemoglobin 15.6 (*)    HCT 48.1 (*)    All other components within normal limits  BASIC METABOLIC PANEL - Abnormal; Notable for the following:    Glucose, Bld 152 (*)    BUN 27 (*)    GFR calc non Af Amer 58 (*)    GFR calc Af Amer 67 (*)    All other components within normal limits  PRO B NATRIURETIC PEPTIDE - Abnormal; Notable for the following:    Pro B Natriuretic peptide (BNP) 193.1 (*)    All other components within normal limits  I-STAT TROPOININ,  ED    Imaging Review Dg Chest 1 View  08/18/2013   CLINICAL DATA:  Chest pain.  Shortness of breath.  EXAM: CHEST - 1 VIEW  COMPARISON:  AP chest 08/18/2013.  FINDINGS: Lungs are clear. Cardiomegaly. Cardiac pacer. Degenerative changes thoracic spine. Cervical spine fusion No acute abnormality.  IMPRESSION: No acute abnormality noted on lateral view of chest .   Electronically Signed   By: [place]   On: 08/18/2013 13:09   Dg Chest Port 1 View  08/18/2013   CLINICAL DATA:  Cough and congestion.  EXAM: PORTABLE CHEST - 1 VIEW  COMPARISON:  04/15/2013  FINDINGS: 1218 hrs. Lung volumes are low with stable asymmetric elevation of the right hemidiaphragm. Vascular congestion noted without overt airspace pulmonary edema. The cardio pericardial silhouette is enlarged. Right-sided pacer/ AICD remains in place. Bones are diffusely demineralized.  IMPRESSION: Stable exam.  Cardiomegaly with vascular congestion.   Electronically Signed   By: Misty Stanley M.D.   On: 08/18/2013 12:41  I personally reviewed the imaging tests through PACS system I reviewed available ER/hospitalization records through the EMR    EKG Interpretation   Date/Time:  Thursday August 18 2013 11:49:43 EDT Ventricular Rate:  72 PR Interval:  83 QRS Duration: 146 QT Interval:  436 QTC Calculation: 477 R Axis:   -91 Text Interpretation:  Atrial-sensed ventricular-paced rhythm No further  analysis attempted due to paced rhythm Baseline wander in lead(s) II No  significant change was found Confirmed by Mariana Wiederholt  MD, Kaitlan Bin ([id number]) on  08/18/2013 12:25:00 PM      MDM   Final diagnoses:  None    Patient's chest x-ray demonstrates only mild cardiomegaly or vascular congestion.  My suspicion for congestive heart failure is very low.  Given her productive cough concern for her cold pneumonia.  Noncontrasted CT scan performed secondary to dye allergy.  D-dimer sent.  Age-adjusted d-dimer is elevated therefore the patient will be  sent to VQ scan.  Care to Dr. Doy Mince to followup on VQ scan.  I suspect if the VQ scan is normal but this may be more bronchitis bronchospasm.  At the end of my care of this patient the patient states that her breathing feels better and her cough is improved.  Likely close PCP followup unless the VQ scan is concerning for PE.  No unilateral leg swelling.  No history of DVT or PE.  No recent surgery or travel    Hoy Morn, MD 08/19/13 [phone]

## 2013-08-23 ENCOUNTER — Encounter: Payer: Self-pay | Admitting: Cardiology

## 2013-08-26 ENCOUNTER — Telehealth: Payer: Self-pay | Admitting: *Deleted

## 2013-08-26 DIAGNOSIS — E875 Hyperkalemia: Secondary | ICD-10-CM

## 2013-08-26 NOTE — Telephone Encounter (Signed)
Message copied by Chauncy Lean on Fri Aug 26, 2013  9:46 AM ------      Message from: Sanda Klein      Created: Thu Aug 25, 2013  6:58 PM       Please ask to stop K-Dur      K was 5.1 and she is on spironolactone and losartan as well.      Recheck BMET 3-4 weeks      Thanks      (added Marylu Lund since I think Pamala Hurry is out tomorrow)      MCr ------

## 2013-08-26 NOTE — Telephone Encounter (Signed)
I spoke with patient and gave instructions to stop potassium.  She verbalized understanding and will recheck in 3 weeks. Lab slip mailed. Patient reported feeling very weak! She commented that she has been feeling this way for a while now, but it is impacting her life.  I will advise Dr Loletha Grayer

## 2013-08-27 NOTE — Telephone Encounter (Signed)
Please schedule for first available appointment in clinic. If it is more than 3 or 4 weeks to see me, please have her see a PA/NP provider in no more than 4 weeks. Thank you MCr

## 2013-08-29 NOTE — Telephone Encounter (Signed)
I spoke with patient and set her up to see [facility] 7/29.

## 2013-08-30 ENCOUNTER — Telehealth: Payer: Self-pay | Admitting: *Deleted

## 2013-08-30 DIAGNOSIS — Z79899 Other long term (current) drug therapy: Secondary | ICD-10-CM

## 2013-08-30 NOTE — Telephone Encounter (Signed)
Patient notified Dr. Loletha Grayer reviewed labs and wants her to stop the potassium - patient already stopped on Friday.  Will place order for repeat BMP in 2-3 weeks.  Will mail to patient and she voiced understanding to get done in 2-3 weeks.

## 2013-08-31 ENCOUNTER — Telehealth: Payer: Self-pay | Admitting: Cardiology

## 2013-08-31 NOTE — Telephone Encounter (Signed)
Pt told to come to pacer clinic in the am.  Kerin Ransom PA-C 08/31/2013 6:22 PM

## 2013-08-31 NOTE — Telephone Encounter (Signed)
Pt called and said the box that she has at home to check her pacemaker is making a noise. I suggested she call the pacemaker company.  Kerin Ransom PA-C 08/31/2013 6:03 PM

## 2013-08-31 NOTE — Telephone Encounter (Signed)
Order placed for lab work.

## 2013-09-01 ENCOUNTER — Ambulatory Visit (INDEPENDENT_AMBULATORY_CARE_PROVIDER_SITE_OTHER): Payer: Medicare Other | Admitting: *Deleted

## 2013-09-01 ENCOUNTER — Ambulatory Visit (INDEPENDENT_AMBULATORY_CARE_PROVIDER_SITE_OTHER): Payer: Medicare Other | Admitting: Physician Assistant

## 2013-09-01 ENCOUNTER — Encounter: Payer: Self-pay | Admitting: Physician Assistant

## 2013-09-01 ENCOUNTER — Telehealth: Payer: Self-pay | Admitting: Cardiovascular Disease

## 2013-09-01 ENCOUNTER — Inpatient Hospital Stay (HOSPITAL_COMMUNITY): Admission: AD | Admit: 2013-09-01 | Payer: Medicare Other | Source: Ambulatory Visit | Admitting: Internal Medicine

## 2013-09-01 VITALS — BP 138/88 | HR 92 | Ht 65.0 in | Wt 249.8 lb

## 2013-09-01 DIAGNOSIS — I428 Other cardiomyopathies: Secondary | ICD-10-CM

## 2013-09-01 DIAGNOSIS — E782 Mixed hyperlipidemia: Secondary | ICD-10-CM

## 2013-09-01 DIAGNOSIS — Z950 Presence of cardiac pacemaker: Secondary | ICD-10-CM

## 2013-09-01 DIAGNOSIS — R079 Chest pain, unspecified: Secondary | ICD-10-CM

## 2013-09-01 DIAGNOSIS — I10 Essential (primary) hypertension: Secondary | ICD-10-CM

## 2013-09-01 DIAGNOSIS — R0602 Shortness of breath: Secondary | ICD-10-CM

## 2013-09-01 DIAGNOSIS — I495 Sick sinus syndrome: Secondary | ICD-10-CM

## 2013-09-01 DIAGNOSIS — I5033 Acute on chronic diastolic (congestive) heart failure: Secondary | ICD-10-CM

## 2013-09-01 DIAGNOSIS — I2510 Atherosclerotic heart disease of native coronary artery without angina pectoris: Secondary | ICD-10-CM

## 2013-09-01 MED ORDER — ISOSORBIDE MONONITRATE ER 30 MG PO TB24: 30.0000 mg | ORAL_TABLET | Freq: Every day | ORAL | Status: DC

## 2013-09-01 MED ORDER — POTASSIUM CHLORIDE CRYS ER 20 MEQ PO TBCR: EXTENDED_RELEASE_TABLET | ORAL | Status: DC

## 2013-09-01 NOTE — Telephone Encounter (Signed)
Pt said her box went off last night.She talked to a PA last night and he told her to go across from the hospital today and get it checked. She could not find the building. Today she is having chest pains and sob She thinks she need to be seen.[name]

## 2013-09-01 NOTE — Patient Instructions (Addendum)
START IMDUR 30 MG DAILY, RX SENT IN  INCREASE LASIX TO 80 MG IN MORNING AND 40 MG IN THE PM FOR 3 DAYS; AFTER THE 3 DAYS GO BACK TO REGULAR DOSE OF LASIX  LAB WORK (BMET) 7/29 WHEN YOU SEE [facility]

## 2013-09-01 NOTE — Progress Notes (Signed)
Cardiology Office Note    Date:  09/01/2013   ID:  [name], DOB 04/15/1945, MRN [id number]  PCP:  [name], MD  Cardiologist:  Dr. Dani Gobble Croitoru      History of Present Illness: [name] is a 68 y.o. female with a hx of NICM >>> EF returned to normal after CRT, HTN, reactive airway disease, 2nd degree AVB s/p PPM upgraded to CRT-D in setting of severe LV dysfunction (changed to CRT-P at gen change in 2014 2/2 normal EF), non-obstructive CAD, obesity, vertigo. Last seen by Dr. Sanda Klein 04/2013.     She was added on to my schedule today with complaints of chest pain and dyspnea.  She was treated with antibiotics and prednisone for URI symptoms about 1 month ago.  Since then she has started to notice L sided sharp chest pain that sometimes occurs with exertion.  It radiates to her back and neck.  She notes worsening DOE.  She also notes 2 pillow orthopnea.  No PND.  She notes increased LE edema.  No syncope.  She has had some resting chest pain as recent as today. She has taken NTG with relief of her CP.     Studies:  - LHC (07/23/12):  Ostial D1 30%, mid LAD [id number] with systolic bridging to [id number], mid D2 30%, RCA 20-30%, EF 50-55%  - Echo (07/2012):  Mild LVH, EF 55-60%, normal wall motion, trivial AI, MAC  - Nuclear (11/2011):  Low risk, no ischemia, EF 57%   Recent Labs: 10/27/2012: TSH 2.128  11/24/2012: ALT 20  08/18/2013: Creatinine 0.98; Hemoglobin 15.6*; Potassium 5.1; Pro B Natriuretic peptide (BNP) 193.1*   Wt Readings from Last 3 Encounters:  09/01/13 249 lb 12.8 oz (113.309 kg)  04/22/13 243 lb 1.6 oz (110.269 kg)  11/24/12 235 lb (106.595 kg)     Past Medical History  Diagnosis Date  . Asthma   . Hypertension   . Arthritis   . GERD (gastroesophageal reflux disease)   . Diverticulosis   . Anemia   . CAD (coronary artery disease)   . Fatty liver disease, nonalcoholic   . Internal hemorrhoids   . Gastritis   . Hyperplastic colon polyp    . Sleep apnea   . ICD (implantable cardiac defibrillator) in place   . Shortness of breath   . IBS (irritable bowel syndrome)   . Anxiety   . CHF (congestive heart failure)   . H/O hiatal hernia   . Obesity   . Back pain     Current Outpatient Prescriptions  Medication Sig Dispense Refill  . albuterol (PROVENTIL HFA;VENTOLIN HFA) 108 (90 BASE) MCG/ACT inhaler Inhale 2 puffs into the lungs every 4 (four) hours as needed for wheezing.       [name] amLODipine (NORVASC) 10 MG tablet Take 10 mg by mouth daily.      [name] aspirin 81 MG chewable tablet Chew 81 mg by mouth daily.      . budesonide-formoterol (SYMBICORT) 160-4.5 MCG/ACT inhaler Inhale 2 puffs into the lungs 2 (two) times daily.      . cetirizine (ZYRTEC) 10 MG tablet Take 10 mg by mouth daily.       . COMBIVENT RESPIMAT 20-100 MCG/ACT AERS respimat Inhale 2 puffs into the lungs as needed for wheezing or shortness of breath.       . fluticasone (FLONASE) 50 MCG/ACT nasal spray Place 1 spray into the nose 2 (two) times daily as needed for rhinitis or  allergies.       . furosemide (LASIX) 40 MG tablet Take 40 mg by mouth 2 (two) times daily.       [name] KLOR-CON M20 20 MEQ tablet       . losartan (COZAAR) 100 MG tablet Take 1 tablet (100 mg total) by mouth daily.  90 tablet  2  . metoprolol succinate (TOPROL-XL) 50 MG 24 hr tablet Take 75 mg by mouth daily. Take with or immediately following a meal.      . nitroGLYCERIN (NITROSTAT) 0.4 MG SL tablet Place 0.4 mg under the tongue every 5 (five) minutes as needed for chest pain.      . pravastatin (PRAVACHOL) 80 MG tablet Take 80 mg by mouth daily.       [name] pyridOXINE (VITAMIN B-6) 100 MG tablet Take 100 mg by mouth every morning.      [name] spironolactone (ALDACTONE) 25 MG tablet Take 0.5 tablets (12.5 mg total) by mouth 2 (two) times daily.  30 tablet  6   No current facility-administered medications for this visit.    Allergies:   Ivp dye; Shellfish-derived products; Sulfa antibiotics; and  Iodine   Social History:  The patient  reports that she quit smoking about 45 years ago. Her smoking use included Cigarettes. She has a .75 pack-year smoking history. She has never used smokeless tobacco. She reports that she does not drink alcohol or use illicit drugs.   Family History:  The patient's family history includes Asthma in her sister; Breast cancer in her mother; Diabetes in her maternal grandmother and mother; Glaucoma in her maternal aunt; Heart disease in her maternal aunt and maternal grandmother; Kidney disease in her maternal grandmother.   ROS:  Please see the history of present illness.      All other systems reviewed and negative.   PHYSICAL EXAM: VS:  BP 138/88  Pulse 92  Ht 5\' 5"  (1.651 m)  Wt 249 lb 12.8 oz (113.309 kg)  BMI 41.57 kg/m2 Well nourished, well developed, in no acute distress HEENT: normal Neck: no JVD Cardiac:  normal S1, S2; RRR; no murmur Lungs:  clear to auscultation bilaterally, no wheezing, rhonchi or rales Abd: soft, nontender, no hepatomegaly Ext: trace bilateral LE edema Skin: warm and dry Neuro:  CNs 2-12 intact, no focal abnormalities noted  EKG:  NSR, HR 92, BiV paced     ASSESSMENT AND PLAN:  Chest pain, unspecified Symptoms with typical > atypical features and are somewhat concerning for angina.  She had no obstructive CAD on cath last year but did have some bridging up to 80% with systole. She also appears to be mildly volume overloaded.  We interrogated her device and it is functioning appropriately.  There were 2 brief episodes of NSVT noted recently (4 and 6 beats).  This is likely insignificant.  Her son just had surgery and she is under a great deal of stress.  We discussed the possibility of admission for further evaluation.  I discussed with Dr. Cristopher Peru who also saw the patient.  After further review, we elected to try to treat her medically.  Will start Imdur 30 mg QD to see if this helps.  I will also increase her Lasix  for several days for diuresis.  She has f/u next week.  She will keep this appt.  Coronary Artery Disease No obstructive CAD at cath last year.  Adjust medical Rx as noted.  Continue ASA and statin.  Add nitrates.  Continue beta  blocker.  Could consider Myoview if symptoms persist.  Cardiomyopathy, nonischemic EF has recovered.  Continue ARB, beta blocker, sprionolactone.  Acute on chronic diastolic heart failure Increase Lasix to 80 mg in A and 40 mg in P x 3 days.  Check BMET and BNP.  Check BMET in 1 week.    Unspecified essential hypertension Controlled.   Mixed hypercholesterolemia and hypertriglyceridemia Continue statin.  Cardiac resynchronization therapy pacemaker (CRT-P) in place  Normal device interrogation today.     Disposition:  Keep f/u with B. Rosita Fire, PA-C next week.   Signed, Versie Starks, MHS 09/01/2013 4:26 PM    [facility] [address], [place], [place]  [address] Phone: [phone]; Fax: [phone]

## 2013-09-01 NOTE — Addendum Note (Signed)
Addended by: Michae Kava on: 09/01/2013 05:21 PM   Modules accepted: Orders

## 2013-09-01 NOTE — Telephone Encounter (Signed)
Returned call to patient she stated she has been having chest pain off and on for the last 3 to 4 days.Stated she took a NTG x 1 this morning with relief.Stated no chest pain at present.Stated she is sob when she walks from room to room.Stated she has appointment with Lyda Jester PA 09/07/13 but don't think she can wait.Also stated she has been under a lot of stress,her son recently had CABG's x 5 and is living at her home.Stated he is sleeping in her bed and she is sleeping on couch.Stated the other night the device box sitting beside her bed went off.Stated her device did not shock her.Stated device box has never went off and she wanted to make sure everything was ok.Appointment scheduled with Richardson Dopp PA at our [facility] office this afternoon at 3:40 pm.Advised to go to ER if she has chest pain again.

## 2013-09-02 ENCOUNTER — Telehealth: Payer: Self-pay | Admitting: *Deleted

## 2013-09-02 LAB — MDC_IDC_ENUM_SESS_TYPE_INCLINIC
Brady Statistic RA Percent Paced: 35 %
Brady Statistic RV Percent Paced: 100 %
Date Time Interrogation Session: 20150723040000
Implantable Pulse Generator Serial Number: 100731
Lead Channel Impedance Value: 1716 Ohm
Lead Channel Impedance Value: 640 Ohm
Lead Channel Impedance Value: 886 Ohm
Lead Channel Pacing Threshold Amplitude: 0.7 V
Lead Channel Pacing Threshold Amplitude: 0.8 V
Lead Channel Pacing Threshold Amplitude: 1.6 V
Lead Channel Pacing Threshold Pulse Width: 0.4 ms
Lead Channel Pacing Threshold Pulse Width: 0.4 ms
Lead Channel Pacing Threshold Pulse Width: 0.8 ms
Lead Channel Sensing Intrinsic Amplitude: 4.9 mV
Lead Channel Setting Pacing Amplitude: 2 V
Lead Channel Setting Pacing Amplitude: 2 V
Lead Channel Setting Pacing Amplitude: 2.4 V
Lead Channel Setting Pacing Pulse Width: 0.4 ms
Lead Channel Setting Pacing Pulse Width: 0.8 ms
Lead Channel Setting Sensing Sensitivity: 2.5 mV
Lead Channel Setting Sensing Sensitivity: 2.5 mV
Zone Setting Detection Interval: 375 ms

## 2013-09-02 LAB — BASIC METABOLIC PANEL
BUN: 23 mg/dL (ref 6–23)
CO2: 31 mEq/L (ref 19–32)
Calcium: 9.4 mg/dL (ref 8.4–10.5)
Chloride: 103 mEq/L (ref 96–112)
Creatinine, Ser: 1.1 mg/dL (ref 0.4–1.2)
GFR: 50.68 mL/min — ABNORMAL LOW (ref >60.00)
Glucose, Bld: 108 mg/dL — ABNORMAL HIGH (ref 70–99)
Potassium: 4.5 mEq/L (ref 3.5–5.1)
Sodium: 139 mEq/L (ref 135–145)

## 2013-09-02 LAB — BRAIN NATRIURETIC PEPTIDE: Pro B Natriuretic peptide (BNP): 66 pg/mL (ref 0.0–100.0)

## 2013-09-02 NOTE — Progress Notes (Signed)
CRT-P device check in clinic. Pt being seen by Nicki Reaper for chest pain/SOB. Normal device function. Thresholds, sensing, impedance consistent with previous measurements. Histograms appropriate for patient and level of activity. No mode switches. 2 nst episodes--6 beats longest. Patient bi-ventricularly pacing 100% of the time. Device programmed with appropriate safety margins. Estimated longevity 9.5 years. Patient enrolled in remote follow-up. ROV in September with [facility].

## 2013-09-02 NOTE — Telephone Encounter (Signed)
pt notified about lab results with verbal understanding to results today

## 2013-09-07 ENCOUNTER — Encounter: Payer: Self-pay | Admitting: Cardiology

## 2013-09-07 ENCOUNTER — Ambulatory Visit (INDEPENDENT_AMBULATORY_CARE_PROVIDER_SITE_OTHER): Payer: Medicare Other | Admitting: Cardiology

## 2013-09-07 VITALS — BP 110/72 | HR 94 | Ht 62.5 in | Wt 243.3 lb

## 2013-09-07 DIAGNOSIS — R5383 Other fatigue: Principal | ICD-10-CM

## 2013-09-07 DIAGNOSIS — R5381 Other malaise: Secondary | ICD-10-CM

## 2013-09-07 DIAGNOSIS — R079 Chest pain, unspecified: Secondary | ICD-10-CM

## 2013-09-07 NOTE — Patient Instructions (Signed)
Lyda Jester, PA-C, has requested that you have a lexiscan myoview. This will have to be done over 2 days. For further information please visit [email or web address]. Please follow instruction sheet, as given.  Brittainy has made no changes in your current medications or treatment plan.  Follow-up with your PCP to be tested for COPD.

## 2013-09-07 NOTE — Progress Notes (Signed)
Patient ID: [name], female   DOB: 1945/04/07, 68 y.o.   MRN: [id number]    09/07/2013 [name]   1945-03-17  [id number]  Primary Physicia [name], MD Primary Cardiologist: Dr. [name]  HPI:  [name] is 68 years old female, followed by Dr. [name], and has a long-standing history of nonischemic cardiomyopathy, hypertension and reactive airway disease. Her most recent echocardiogram was performed less than a year ago and showed normal left ventricular systolic function with an ejection fraction of 55-60%. The mitral inflow suggested normal filling pressures. She had a low risk nuclear scan in October of 2013.  She initially received a dual-chamber pacemaker in 2001 for second degree AV block. She then developed congestive heart failure. At one point left ventricular ejection fraction was severely depressed and her pacemaker was upgraded to a CRT-D device in 2008. She was a "hyperresponder" to CRT and EF returned to normal. Sequentially, at the time of her generator change out in 2014 her biventricular defibrillator generator was changed out for a biventricular pacemaker. She has minor coronary atherosclerosis with lesions between 20% and 50% scattered throughout all 3 coronary arteries but does not have a history of myocardial infarction. She is morbidly obese and recently has been told that her glucose is borderline high although she has never been diagnosed with full-blown diabetes mellitus.   She presents to clinic today with complaints of intermittent episodes of chest pressure/ tightness. Theses episodes have been occuring over the last 2-3 weeks. Her symptoms occur at rest and sometimes with light exertion, although not always. Symptoms last, on average, less than 5-10 minutes and are often self limiting. She also reports extreame  fatigue, dyspnea and decreased exercise tolerance, although she feels a large part of this may be due to her obesity. She states she  simply just gives out easily. Small task such as mopping and vacuuming have become a major chore for her and she often has to stop in the middle of these activities. She denies associated diaphoresis, nausea, vomiting, dizziness, syncope/near syncope. She denies melena and hematochezia. She recently had labs drawn. A BNP on 09/01/13 was normal at 66, suggesting a pulmonary etiology regarding her dyspnea. A recent CBC on 7/21 revealed normal hgb at 15.6.    Current Outpatient Prescriptions  Medication Sig Dispense Refill  . albuterol (PROVENTIL HFA;VENTOLIN HFA) 108 (90 BASE) MCG/ACT inhaler Inhale 2 puffs into the lungs every 4 (four) hours as needed for wheezing.       [name] amLODipine (NORVASC) 10 MG tablet Take 10 mg by mouth daily.      [name] aspirin 81 MG chewable tablet Chew 81 mg by mouth daily.      . budesonide-formoterol (SYMBICORT) 160-4.5 MCG/ACT inhaler Inhale 2 puffs into the lungs 2 (two) times daily.      . cetirizine (ZYRTEC) 10 MG tablet Take 10 mg by mouth daily.       . COMBIVENT RESPIMAT 20-100 MCG/ACT AERS respimat Inhale 2 puffs into the lungs as needed for wheezing or shortness of breath.       . fluticasone (FLONASE) 50 MCG/ACT nasal spray Place 1 spray into the nose 2 (two) times daily as needed for rhinitis or allergies.       . furosemide (LASIX) 40 MG tablet Take 40 mg by mouth 2 (two) times daily.       . isosorbide mononitrate (IMDUR) 30 MG 24 hr tablet Take 1 tablet (30 mg total) by mouth daily.  Brea  tablet  11  . losartan (COZAAR) 100 MG tablet Take 1 tablet (100 mg total) by mouth daily.  90 tablet  2  . metoprolol succinate (TOPROL-XL) 50 MG 24 hr tablet Take 75 mg by mouth daily. Take with or immediately following a meal.      . nitroGLYCERIN (NITROSTAT) 0.4 MG SL tablet Place 0.4 mg under the tongue every 5 (five) minutes as needed for chest pain.      [name] omeprazole (PRILOSEC) 40 MG capsule Take 1 capsule by mouth daily.      . potassium chloride SA (KLOR-CON M20) 20 MEQ  tablet ON HOLD      . pravastatin (PRAVACHOL) 80 MG tablet Take 80 mg by mouth daily.       [name] pyridOXINE (VITAMIN B-6) 100 MG tablet Take 100 mg by mouth every morning.      [name] spironolactone (ALDACTONE) 25 MG tablet Take 0.5 tablets (12.5 mg total) by mouth 2 (two) times daily.  30 tablet  6   No current facility-administered medications for this visit.    Allergies  Allergen Reactions  . Ivp Dye [Iodinated Diagnostic Agents] Shortness Of Breath  . Shellfish-Derived Products Anaphylaxis  . Sulfa Antibiotics Shortness Of Breath  . Iodine Hives    History   Social History  . Marital Status: Widowed    Spouse Name: N/A    Number of Children: 61  . Years of Education: N/A   Occupational History  . 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 Smoking status: Former Smoker -- 0.25 packs/day for 3 years    Types: Cigarettes    Quit date: 02/11/1968  . Smokeless tobacco: Never Used  . Alcohol Use: No  . Drug Use: No  . Sexual Activity: No   Other Topics Concern  . Not on file   Social History Narrative   Widowed last year. She lives with her two sons.     Review of Systems: General: negative for chills, fever, night sweats or weight changes.  Cardiovascular: negative for chest pain, dyspnea on exertion, edema, orthopnea, palpitations, paroxysmal nocturnal dyspnea or shortness of breath Dermatological: negative for rash Respiratory: negative for cough or wheezing Urologic: negative for hematuria Abdominal: negative for nausea, vomiting, diarrhea, bright red blood per rectum, melena, or hematemesis Neurologic: negative for visual changes, syncope, or dizziness All other systems reviewed and are otherwise negative except as noted above.    Blood pressure 110/72, pulse 94, height 5' 2.5" (1.588 m), weight 243 lb 4.8 oz (110.36 kg).  General appearance: alert, cooperative, no distress morbidly obeses Neck: no carotid bruit and no JVD Lungs: clear to  auscultation bilaterally Heart: regular rate and rhythm, S1, S2 normal, no murmur, click, rub or gallop Extremities: no LEE Pulses: 2+ and symmetric Skin: warm and dry Neurologic: Grossly normal  EKG NSR, nonspecific interventricular block, HR 94 bpm  ASSESSMENT AND PLAN:   1. Chest Pain: With symptomatolgy concerning for angina, along with known mild CAD at time of last LHC  with lesions between 20% and 50% scattered throughout all 3 coronary arteries, along with risk factors including morbid obesity and HTN, we will order a NST to rule out ischemia. Due to her obesity, she will require a 2 day study. She has reactive airway disease, and feels that she will give out on the treadmill fast and will not be able to fully participate in an exercise test. I did not appreciate any wheezing on physical exam and  she reports full daily compliance with her inhalers. I thinks she will be ok to do a Lexiscan.   2. Dyspnea: recent BNP was normal at 66. I feel a large part may be related to either her COPD or morbid obesity/ deconditioning. Recommended that she also f/u with her pulmonologist.   2. Fatigue/decreased exercise tolerance: Recent CBC is negative for anemia. Will check a TSH and will assess for coronary ischemia with NST.    PLAN  NST to rule out ischemia as well as check a TSH in the setting of extreme fatigue. F/u in 2 weeks after stress test.   SIMMONS, BRITTAINYPA-C 09/07/2013 1:46 PM

## 2013-09-08 ENCOUNTER — Encounter: Payer: Self-pay | Admitting: Internal Medicine

## 2013-09-08 ENCOUNTER — Ambulatory Visit (INDEPENDENT_AMBULATORY_CARE_PROVIDER_SITE_OTHER): Payer: Medicare Other | Admitting: Internal Medicine

## 2013-09-08 ENCOUNTER — Telehealth: Payer: Self-pay | Admitting: *Deleted

## 2013-09-08 VITALS — BP 106/76 | HR 96 | Temp 97.6°F | Ht 62.0 in | Wt 248.0 lb

## 2013-09-08 DIAGNOSIS — J45909 Unspecified asthma, uncomplicated: Secondary | ICD-10-CM

## 2013-09-08 DIAGNOSIS — R0989 Other specified symptoms and signs involving the circulatory and respiratory systems: Secondary | ICD-10-CM

## 2013-09-08 DIAGNOSIS — R0609 Other forms of dyspnea: Secondary | ICD-10-CM

## 2013-09-08 DIAGNOSIS — R0600 Dyspnea, unspecified: Secondary | ICD-10-CM

## 2013-09-08 LAB — TSH: TSH: 2.062 u[IU]/mL (ref 0.350–4.500)

## 2013-09-08 LAB — BASIC METABOLIC PANEL
BUN: 26 mg/dL — ABNORMAL HIGH (ref 6–23)
CO2: 27 mEq/L (ref 19–32)
Calcium: 9.3 mg/dL (ref 8.4–10.5)
Chloride: 106 mEq/L (ref 96–112)
Creat: 1.1 mg/dL (ref 0.50–1.10)
Glucose, Bld: 118 mg/dL — ABNORMAL HIGH (ref 70–99)
Potassium: 4.2 mEq/L (ref 3.5–5.3)
Sodium: 141 mEq/L (ref 135–145)

## 2013-09-08 MED ORDER — CLOTRIMAZOLE 10 MG MT TROC: OROMUCOSAL | Status: DC

## 2013-09-08 MED ORDER — PREDNISONE 10 MG PO TABS
ORAL_TABLET | ORAL | Status: DC
Start: 2013-09-08 — End: 2013-10-10

## 2013-09-08 NOTE — Patient Instructions (Addendum)
Change omeprazole to Take 30- 60 min before your first and last meals of the day   Work on inhaler technique:  relax and gently blow all the way out then take a nice smooth deep breath back in, triggering the inhaler at same time you start breathing in.  Hold for up to 5 seconds if you can.  Rinse and gargle with water when done  If not better > Prednisone 10 mg take  4 each am x 2 days,   2 each am x 2 days,  1 each am x 2 days and stop   GERD (REFLUX)  is an extremely common cause of respiratory symptoms, many times with no significant heartburn at all.    It can be treated with medication, but also with lifestyle changes including avoidance of late meals, excessive alcohol, smoking cessation, and avoid fatty foods, chocolate, peppermint, colas, red wine, and acidic juices such as orange juice.  NO MINT OR MENTHOL PRODUCTS SO NO COUGH DROPS  USE SUGARLESS CANDY INSTEAD (jolley ranchers or Stover's)  NO OIL BASED VITAMINS - use powdered substitutes.   Please schedule a follow up office visit in 4 weeks, sooner if needed with pfts on return    Late add clotrimazole troche 10 mg four times daily as needed

## 2013-09-08 NOTE — Assessment & Plan Note (Signed)
DDX of  difficult airways management all start with A and  include Adherence, Ace Inhibitors, Acid Reflux, Active Sinus Disease, Alpha 1 Antitripsin deficiency, Anxiety masquerading as Airways dz,  ABPA,  allergy(esp in young), Aspiration (esp in elderly), Adverse effects of DPI,  Active smokers, plus two Bs  = Bronchiectasis and Beta blocker use..and one C= CHF  Adherence is always the initial "prime suspect" and is a multilayered concern that requires a "trust but verify" approach in every patient - starting with knowing how to use medications, especially inhalers, correctly, keeping up with refills and understanding the fundamental difference between maintenance and prns vs those medications only taken for a very short course and then stopped and not refilled.  - The proper method of use, as well as anticipated side effects, of a metered-dose inhaler are discussed and demonstrated to the patient. Improved effectiveness after extensive coaching during this visit to a level of approximately  75% from baseline of < 25%  ? Acid (or non-acid) GERD > always difficult to exclude as up to 75% of pts in some series report no assoc GI/ Heartburn symptoms> rec max (24h)  acid suppression and diet restrictions/ reviewed and instructions given in writing.   ? chf > excluded by bnp <<100    Each maintenance medication was reviewed in detail including most importantly the difference between maintenance and as needed and under what circumstances the prns are to be used.  Please see instructions for details which were reviewed in writing and the patient given a copy.

## 2013-09-08 NOTE — Assessment & Plan Note (Addendum)
Probably will find out there is sign restrictive component as well related to body habitus but will need full pft to sort out this from copd   > pfts need to be completed as was the prev rec

## 2013-09-08 NOTE — Telephone Encounter (Signed)
Message copied by Raiford Simmonds on Thu Sep 08, 2013 12:37 PM ------      Message from: Sanda Klein      Created: Thu Sep 08, 2013 11:43 AM       Borderline blood glucose, maybe a little "dry", otherwise normal ------

## 2013-09-08 NOTE — Telephone Encounter (Signed)
Spoke to Grover (son). Result given . Verbalized understanding Any question patient can call back

## 2013-09-08 NOTE — Progress Notes (Signed)
Subjective:   Patient ID: [name], female    DOB: 1945-10-14   MRN: [id number]    Brief patient profile:  40 yowf quit smoking in 1970s with h/o asthma as child but no trouble while smoking referred to pulmonary clinic 08/25/2012 for eval of sob bu [name] after admit:   Admit date: 07/22/2012  Discharge date: 07/24/2012  Primary Care Provider: Dr Arville Care  Primary Cardiologist: Dr Rollene Fare  Discharge Diagnoses  Unstable angina   HYPERTENSION- ;poor control  CAD, [id number] LAD with systolic bridging- [id number]  PTVDP 2001, upgraded to [facility] BiV ICD Sept 2008  Cardiomyopathy, nonischemic, Bi V responder, EF >55% by 2D Oct 2013  Allergic to IV contrast  Morbid obesity  COPD (chronic obstructive pulmonary disease), asthmatic component  GERD (gastroesophageal reflux disease)  Sleep apnea, C-pap intol  ASTHMA, UNSPECIFIED, UNSPECIFIED STATUS   08/25/2012 1st pulmonary eval cc onset of sob p ET for knee surgery, variable and improved with symbicort but variably better for up to a year under the care of Dr Orvil Feil = symbicort and zytrec and nasal spray and no spiriva and no nebs  And maybe  Better p  Albuterol" if walked too fast".  Assoc with overt HB, sense of "chest congestion" but no excess mucus production  >>stop spiriva and added pepcid and delsym   09/08/2012 Follow up and Med review  Pt returns for 2 week follow up and med review  Seen for IOV for 2nd opinion for Dyspnea /Asthma vs COPD last ov . She was recommended to stop spiriva and continue on symbicort Twice daily   Added Pepcid At bedtime  For possible GERD component.  She says she feels she is doing okay. No flare in cough or wheezing.  Does have OSA but intolerant to CPAP . Wakes up tired a lot and feel fatigued through day.  Rec No change rx, med calendar done      09/08/2013 f/u ov/Berton Butrick re: obesity/ gerd/ no med cal/ symbicort 160 2bid and xopenex  Chief Complaint  Patient presents with  . Acute Visit   Pt c/o increased SOB for the past month.  She is SOB with or w/o any exertion.  "Feels like something cutting my air off".  She also c/o wheezing and cough- prod with thick, white sputum.  She is using albuterol inhaler 3-4 times daily. Has neb at home but has not used yet.   onset was gradual, pattern is persistent daily > noct x worse  over a month, really extends back a year  feels choking sensation day > noct no purulent sputum, poor hfa (see a/p) To ER 08/18/13 with neg v/q  Much better p pred Taking ppi at hs Has saba neb but not using   No obvious patterns in day to day or daytime variabilty or assoc chronic cough or cp or chest tightness, subjective wheeze overt sinus or hb symptoms. No unusual exp hx or h/o childhood pna/ asthma or knowledge of premature birth.  Sleeping ok without nocturnal  or early am exacerbation  of respiratory  c/o's or need for noct saba. Also denies any obvious fluctuation of symptoms with weather or environmental changes or other aggravating or alleviating factors except as outlined above   Current Medications, Allergies, Complete Past Medical History, Past Surgical History, Family History, and Social History were reviewed in [employer] record.  ROS  The following are not active complaints unless bolded sore throat, dysphagia, dental problems, itching,  sneezing,  nasal congestion or excess/ purulent secretions, ear ache,   fever, chills, sweats, unintended wt loss, pleuritic or exertional cp, hemoptysis,  orthopnea pnd or leg swelling, presyncope, palpitations, heartburn, abdominal pain, anorexia, nausea, vomiting, diarrhea  or change in bowel or urinary habits, change in stools or urine, dysuria,hematuria,  rash, arthralgias, visual complaints, headache, numbness weakness or ataxia or problems with walking or coordination,  change in mood/affect or memory.               Objective:   Physical Exam   Elderly obese wf nad dry sounding  cough  Wt Readings from Last 3 Encounters:  09/08/13 248 lb (112.492 kg)  09/07/13 243 lb 4.8 oz (110.36 kg)  09/01/13 249 lb 12.8 oz (113.309 kg)       HEENT mild turbinate edema. Edentulous. Oropharynx no thrush or excess pnd or cobblestoning.  No JVD or cervical adenopathy. Mild accessory muscle hypertrophy. Trachea midline, nl thryroid. Chest was hyperinflated by percussion with diminished breath sounds and moderate increased exp time without wheeze. Hoover sign positive at mid inspiration. Regular rate and rhythm without murmur gallop or rub or increase P2 or edema.  Abd: no hsm, nl excursion. Ext warm without cyanosis or clubbing.    V/q 08/18/13 No segmental perfusion defects are seen to suggest pulmonary  thromboembolism. Area of mild decreased ventilation and perfusion in  the posterior left upper lobe, of unclear etiology. No other  abnormality.   Non contrast CT chest 08/18/13 neg  Lab Results  Component Value Date   PROBNP 66.0 09/01/2013     Recent Labs Lab 09/01/13 1707 09/07/13 1507  NA 139 141  K 4.5 4.2  CL 103 106  CO2 31 27  BUN 23 26*  CREATININE 1.1 1.10  GLUCOSE 108* 118*   Lab Results  Component Value Date   WBC 12.9* 08/18/2013   HGB 15.6* 08/18/2013   HCT 48.1* 08/18/2013   MCV 87.3 08/18/2013   PLT 202 08/18/2013        Assessment & Plan:

## 2013-09-09 ENCOUNTER — Other Ambulatory Visit: Payer: Self-pay | Admitting: Cardiovascular Disease

## 2013-09-09 NOTE — Telephone Encounter (Signed)
Rx was sent to pharmacy electronically. 

## 2013-09-13 ENCOUNTER — Telehealth (HOSPITAL_COMMUNITY): Payer: Self-pay

## 2013-09-13 NOTE — Telephone Encounter (Signed)
Encounter complete. 

## 2013-09-15 ENCOUNTER — Encounter (HOSPITAL_COMMUNITY): Payer: Medicare Other

## 2013-09-21 IMAGING — CR DG THORACIC SPINE 2V
3 series · 3 of 3 positions shown · non-contrast
Comparison: CT scan 11/23/2011

CLINICAL DATA: fall, upper back pain

THORACIC SPINE - 2 VIEW

[w thoracic spine lat (1 of 3)]
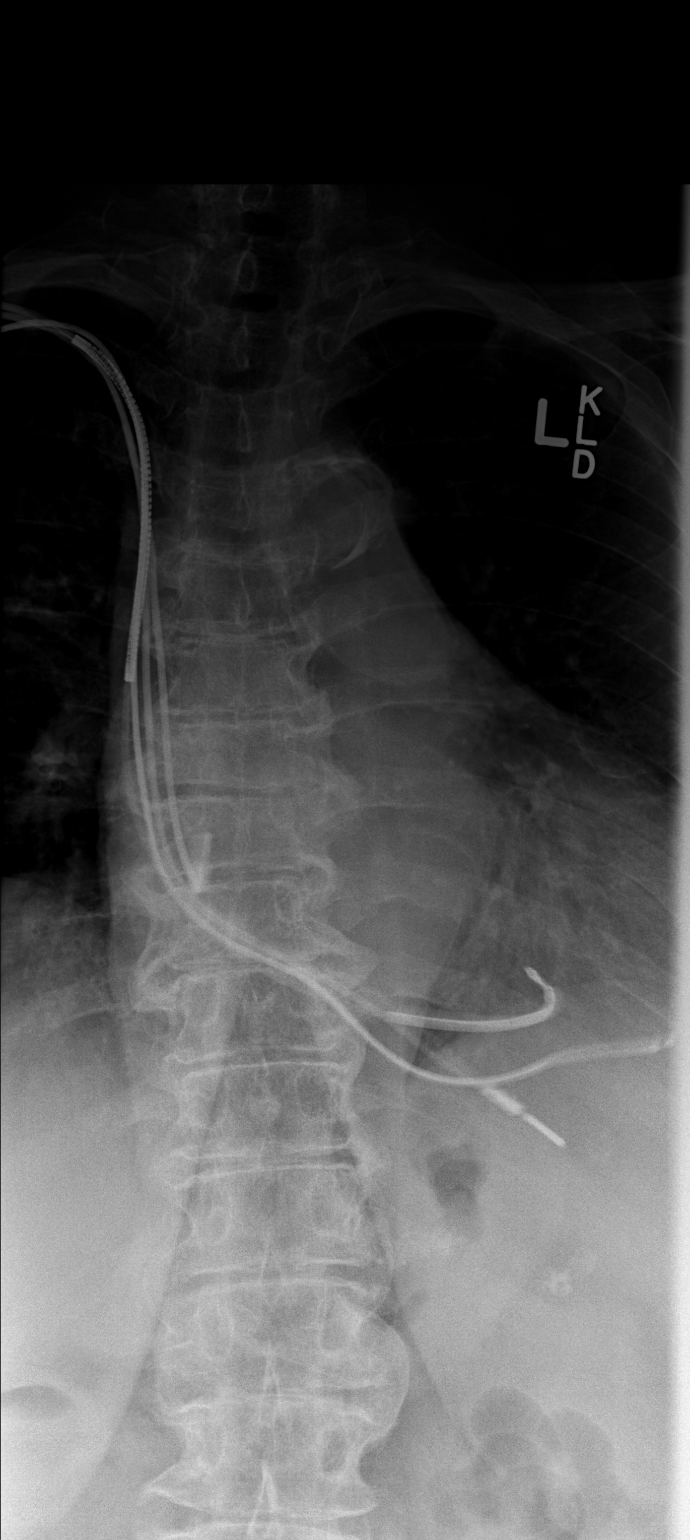

[w thoracic spine lat (2 of 3)]
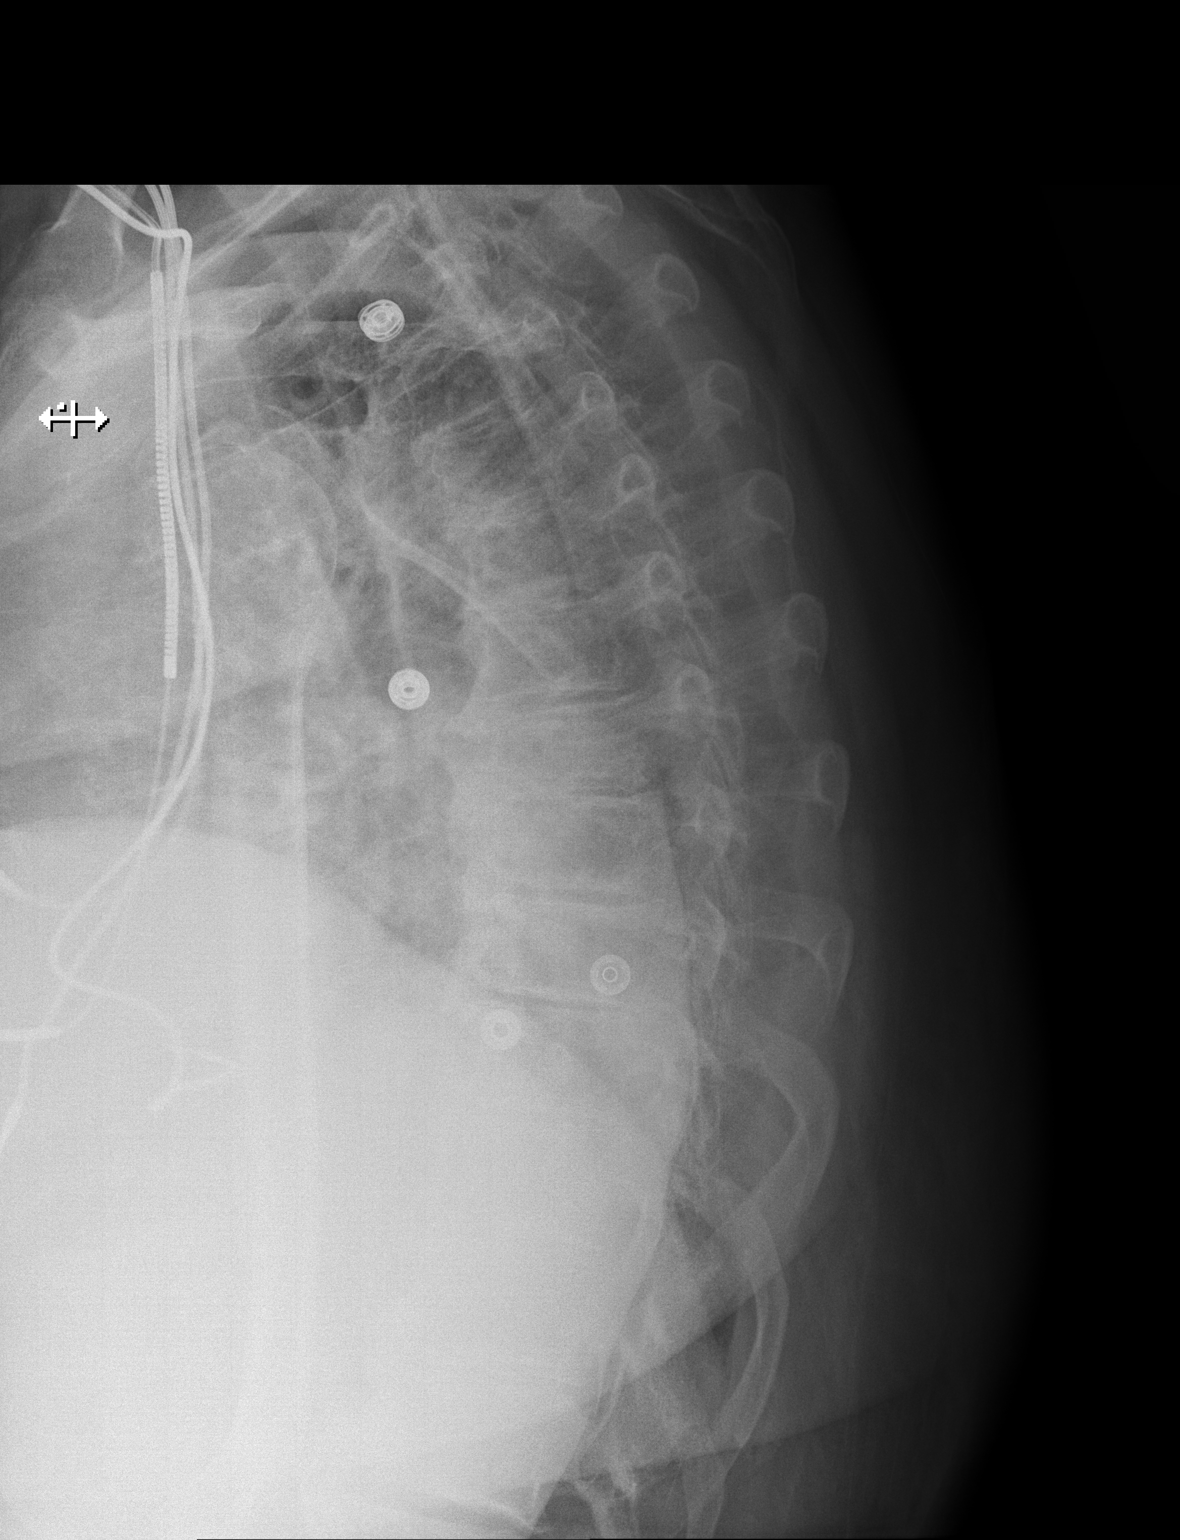

[w thoracic spine lat (3 of 3)]
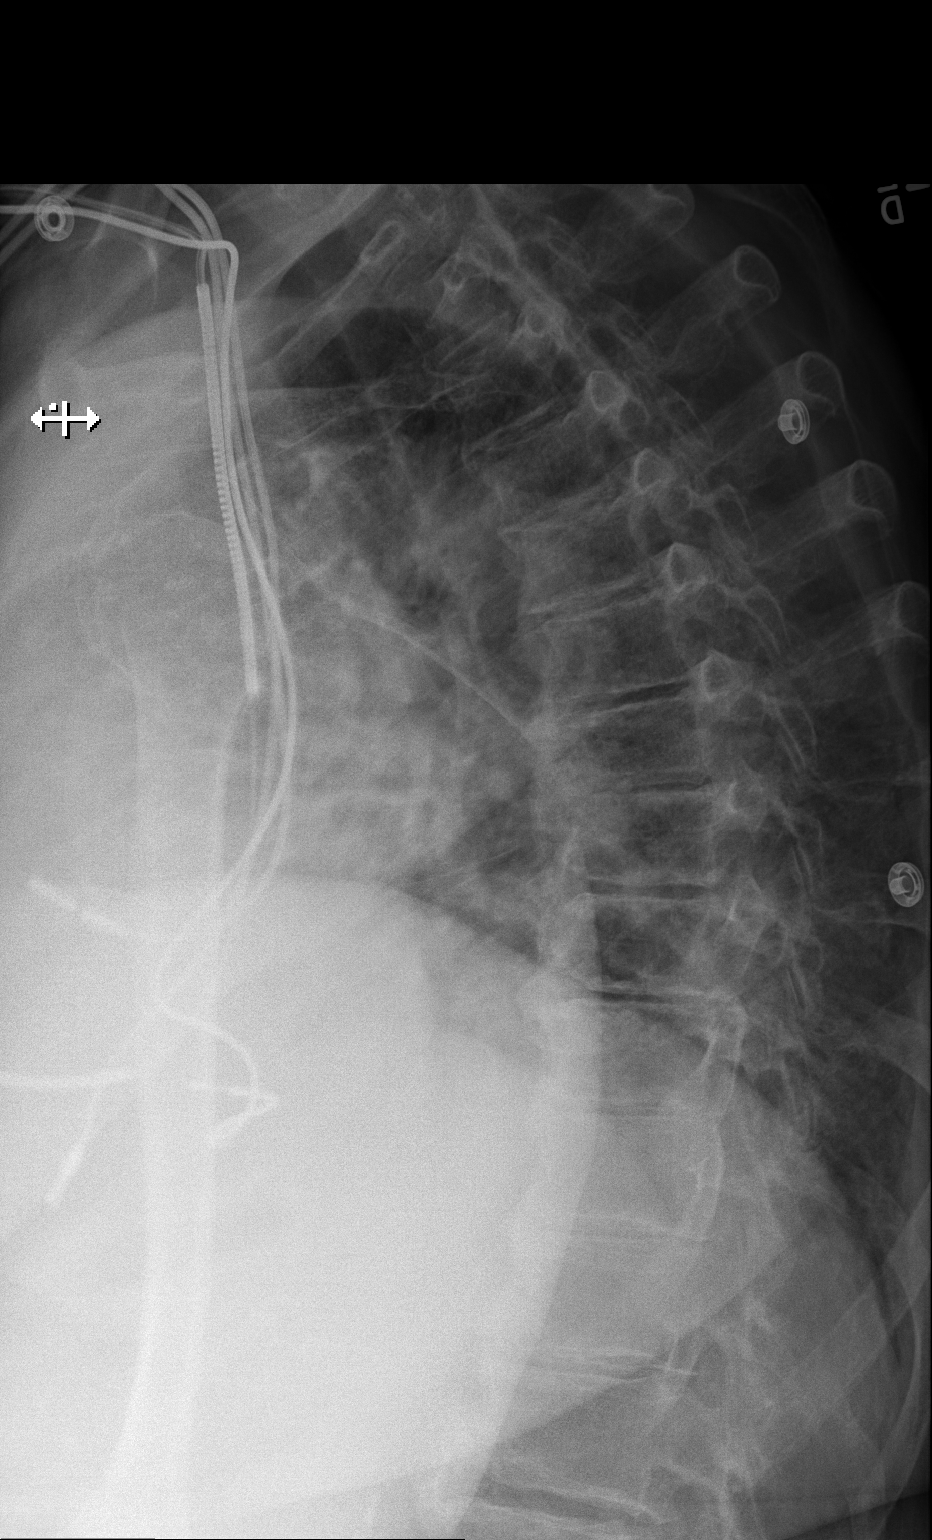

[3 of 3 positions shown; findings below may reference images not displayed]

FINDINGS: No paraspinal hematoma.  Normal alignment.  Significant
degenerative disc disease throughout the thoracic spine.  No
fracture.
IMPRESSION: No acute findings

## 2013-09-21 IMAGING — CR DG SHOULDER 2+V*L*
2 series · 2 of 2 positions shown · non-contrast
Comparison: Chest radiograph 11/22/2011

CLINICAL DATA: Fall and arm pain.

LEFT SHOULDER - 2+ VIEW

[w shoulder internal left]
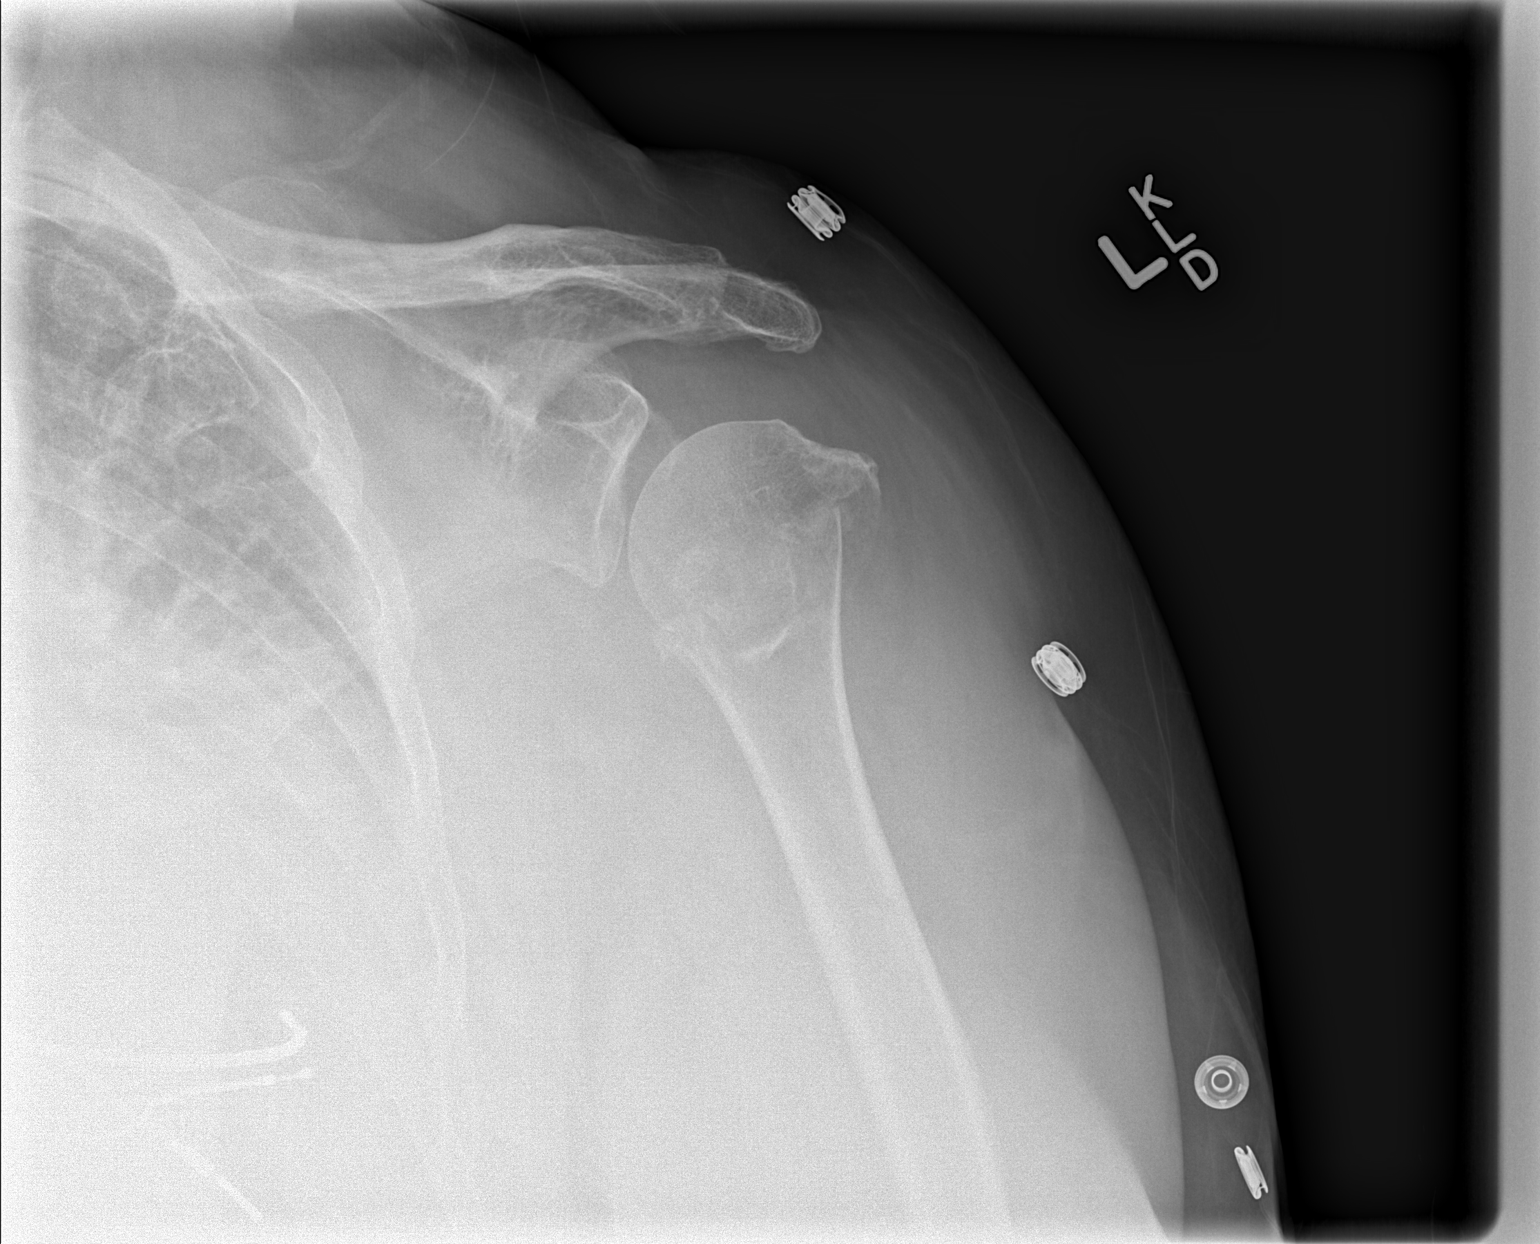

[w shoulder y-view left]
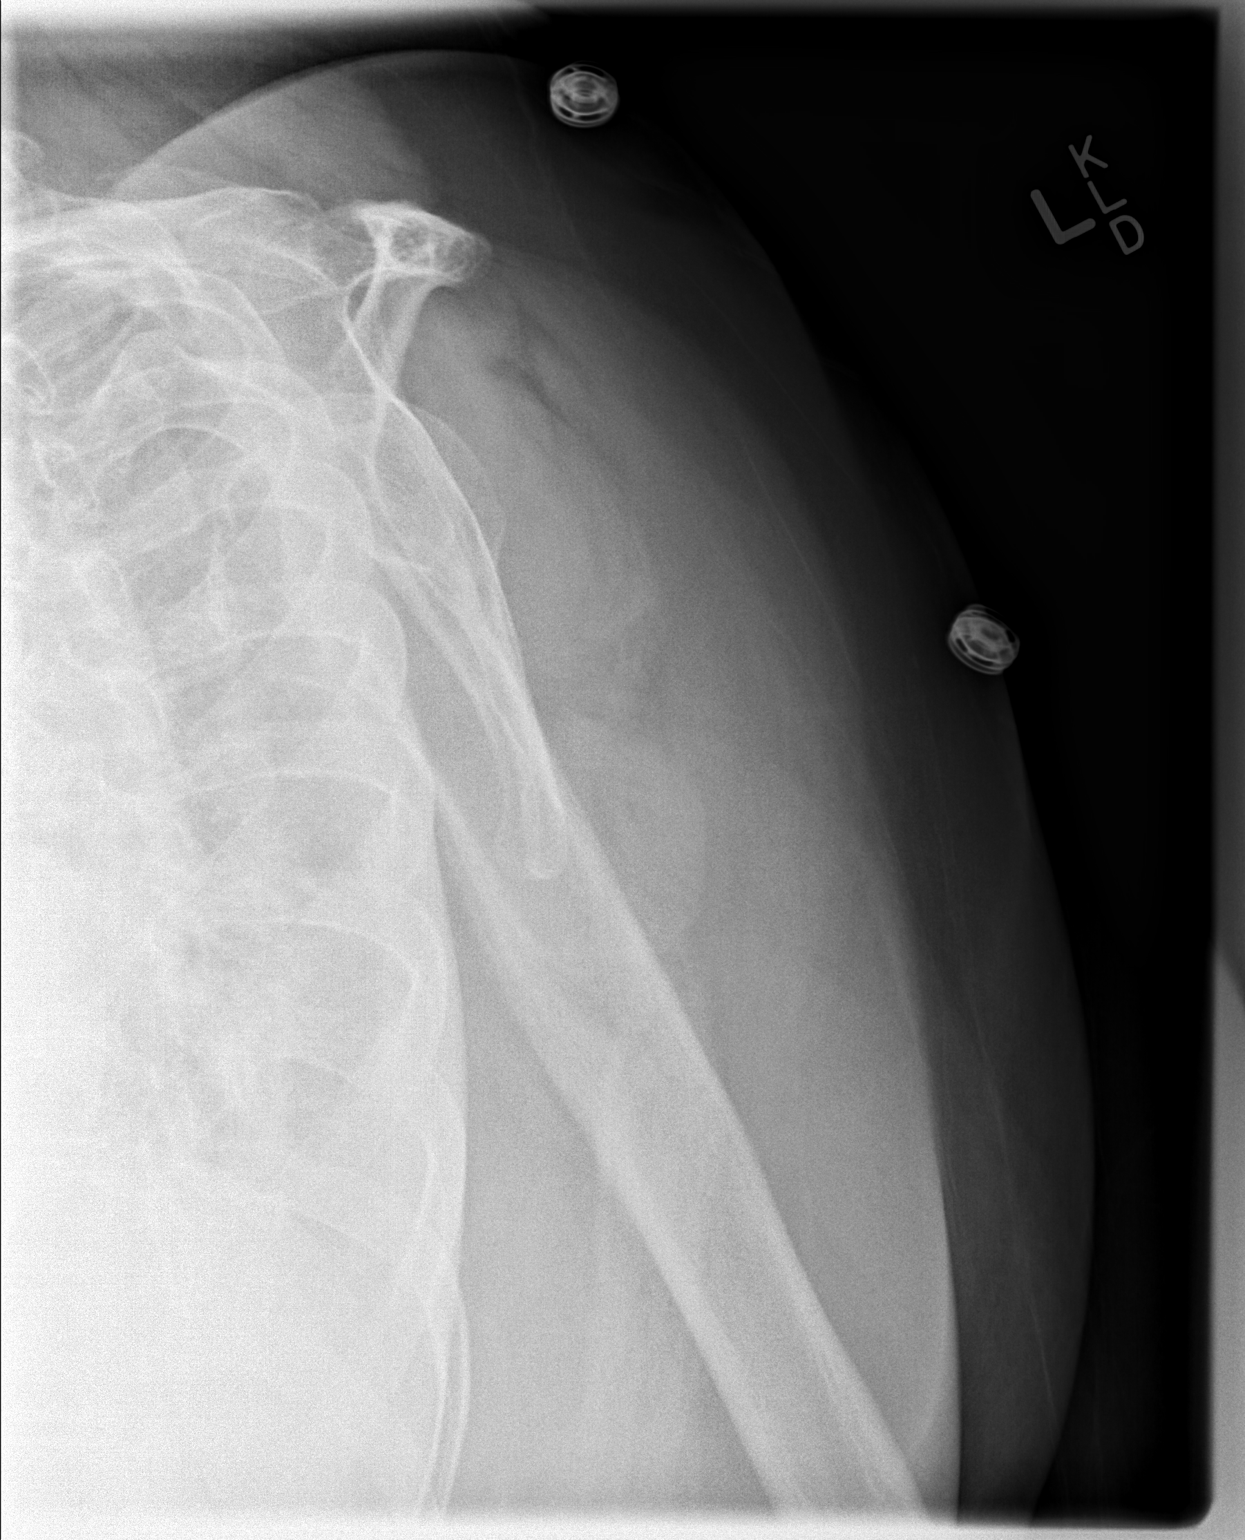

[2 of 2 positions shown; findings below may reference images not displayed]

FINDINGS: Two views of the left shoulder demonstrate a displaced
and likely comminuted fracture involving the proximal left humerus.
The left shoulder appears located on these views.  No gross
abnormality to the left AC joint.
IMPRESSION: Displaced fracture of the proximal left humerus.

## 2013-09-23 ENCOUNTER — Encounter: Payer: Self-pay | Admitting: Cardiovascular Disease

## 2013-10-10 ENCOUNTER — Encounter: Payer: Self-pay | Admitting: Internal Medicine

## 2013-10-10 ENCOUNTER — Ambulatory Visit (INDEPENDENT_AMBULATORY_CARE_PROVIDER_SITE_OTHER): Payer: Medicare Other | Admitting: Internal Medicine

## 2013-10-10 VITALS — BP 120/80 | HR 108 | Temp 98.7°F | Ht 61.0 in | Wt 243.0 lb

## 2013-10-10 DIAGNOSIS — I10 Essential (primary) hypertension: Secondary | ICD-10-CM

## 2013-10-10 DIAGNOSIS — R0989 Other specified symptoms and signs involving the circulatory and respiratory systems: Secondary | ICD-10-CM

## 2013-10-10 DIAGNOSIS — R0609 Other forms of dyspnea: Secondary | ICD-10-CM

## 2013-10-10 DIAGNOSIS — R0600 Dyspnea, unspecified: Secondary | ICD-10-CM

## 2013-10-10 DIAGNOSIS — J45909 Unspecified asthma, uncomplicated: Secondary | ICD-10-CM

## 2013-10-10 DIAGNOSIS — G4730 Sleep apnea, unspecified: Secondary | ICD-10-CM

## 2013-10-10 LAB — PULMONARY FUNCTION TEST
DL/VA % pred: 128 %
DL/VA: 5.63 ml/min/mmHg/L
DLCO unc % pred: 87 %
DLCO unc: 17.61 ml/min/mmHg
FEF 25-75 Post: 3.71 L/sec
FEF 25-75 Pre: 2.53 L/sec
FEF2575-%Change-Post: 46 %
FEF2575-%Pred-Post: 211 %
FEF2575-%Pred-Pre: 144 %
FEV1-%Change-Post: 10 %
FEV1-%Pred-Post: 87 %
FEV1-%Pred-Pre: 79 %
FEV1-Post: 1.75 L
FEV1-Pre: 1.59 L
FEV1FVC-%Change-Post: 1 %
FEV1FVC-%Pred-Pre: 114 %
FEV6-%Change-Post: 8 %
FEV6-%Pred-Post: 78 %
FEV6-%Pred-Pre: 72 %
FEV6-Post: 1.98 L
FEV6-Pre: 1.83 L
FEV6FVC-%Pred-Post: 105 %
FEV6FVC-%Pred-Pre: 105 %
FVC-%Change-Post: 8 %
FVC-%Pred-Post: 74 %
FVC-%Pred-Pre: 68 %
FVC-Post: 1.98 L
FVC-Pre: 1.83 L
Post FEV1/FVC ratio: 89 %
Post FEV6/FVC ratio: 100 %
Pre FEV1/FVC ratio: 87 %
Pre FEV6/FVC Ratio: 100 %
RV % pred: 92 %
RV: 1.88 L
TLC % pred: 81 %
TLC: 3.73 L

## 2013-10-10 MED ORDER — BISOPROLOL FUMARATE 5 MG PO TABS: 5.0000 mg | ORAL_TABLET | Freq: Every day | ORAL | Status: DC

## 2013-10-10 MED ORDER — BUDESONIDE-FORMOTEROL FUMARATE 80-4.5 MCG/ACT IN AERO: INHALATION_SPRAY | RESPIRATORY_TRACT | Status: DC

## 2013-10-10 NOTE — Assessment & Plan Note (Signed)
-   10/10/2013 PFT's no sign airflow obst p am symbicort 160 > try symbicort 80 2bid   The proper method of use, as well as anticipated side effects, of a metered-dose inhaler are discussed and demonstrated to the patient. Improved effectiveness after extensive coaching during this visit to a level of approximately  90% so should do fine with the symbicort 80 as was not really getting all the 160 in and her lung function is normal.

## 2013-10-10 NOTE — Assessment & Plan Note (Signed)
I suspect a lot of her daytime symptoms are related to untreated sleep apnea and has never seen a sleep specialist > referred

## 2013-10-10 NOTE — Assessment & Plan Note (Signed)
Strongly prefer in this setting: Bystolic, the most beta -1  selective Beta blocker available in sample form, with bisoprolol the most selective generic choice  on the market.  Will try bisoprolol 5 mg per day pending next f/u with Dr Loletha Grayer

## 2013-10-10 NOTE — Progress Notes (Signed)
Subjective:   Patient ID: [name], female    DOB: 12/30/1945   MRN: [id number]    Brief patient profile:  67 yowf quit smoking in 1970s with h/o asthma as child but no trouble while smoking referred to pulmonary clinic 08/25/2012 for eval of sob bu Dr [name] after admit:   Admit date: 07/22/2012  Discharge date: 07/24/2012  Primary [name] Provider: Dr [name]  Primary Cardiologist: Dr [name]  Discharge Diagnoses  Unstable angina   HYPERTENSION- ;poor control  CAD, [id number] LAD with systolic bridging- [id number]  PTVDP 2001, upgraded to [facility] BiV ICD Sept 2008  Cardiomyopathy, nonischemic, Bi V responder, EF >55% by 2D Oct 2013  Allergic to IV contrast  Morbid obesity  COPD (chronic obstructive pulmonary disease), asthmatic component  GERD (gastroesophageal reflux disease)  Sleep apnea, C-pap intol  ASTHMA, UNSPECIFIED, UNSPECIFIED STATUS   08/25/2012 1st pulmonary eval cc onset of sob p ET for knee surgery, variable and improved with symbicort but variably better for up to a year under the [name] of Dr [name] = symbicort and zytrec and nasal spray and no spiriva and no nebs  And maybe  Better p  Albuterol" if walked too fast".  Assoc with overt HB, sense of "chest congestion" but no excess mucus production  >>stop spiriva and added pepcid and delsym   09/08/2012 Follow up and Med review  Pt returns for 2 week follow up and med review  Seen for IOV for 2nd opinion for Dyspnea /Asthma vs COPD last ov . She was recommended to stop spiriva and continue on symbicort Twice daily   Added Pepcid At bedtime  For possible GERD component.  She says she feels she is doing okay. No flare in cough or wheezing.  Does have OSA but intolerant to CPAP . Wakes up tired a lot and feel fatigued through day.  Rec No change rx, med calendar done      09/08/2013 f/u ov/[name] re: obesity/ gerd/ no med cal/ symbicort 160 2bid and xopenex  Chief Complaint  Patient presents with  . Acute Visit     Pt c/o increased SOB for the past month.  She is SOB with or w/o any exertion.  "Feels like something cutting my air off".  She also c/o wheezing and cough- prod with thick, white sputum.  She is using albuterol inhaler 3-4 times daily. Has neb at home but has not used yet.   onset was gradual, pattern is persistent daily > noct x worse  over a month, really extends back a year  feels choking sensation day > noct no purulent sputum, poor hfa (see a/p) To ER 08/18/13 with neg v/q  Much better p pred Taking ppi at hs Has saba neb but not using  rec Change omeprazole to Take 30- 60 min before your first and last meals of the day  Work on inhaler technique:    If not better > Prednisone 10 mg take  4 each am x 2 days,   2 each am x 2 days,  1 each am x 2 days and stop  GERD diet   10/10/2013 f/u ov/[name] re: MO/ ? Asthma on symbicort 160 2bid and prn proair Chief Complaint  Patient presents with  . Follow-up    PFT done today. Breathing may be slightly improved. Using albuterol on average oncce per day.   [employer] walking leans on cart due to sob Also fatigued / not able to tolerate cpap by Dr  [name]  No obvious patterns in day to day or daytime variabilty or assoc chronic cough or cp or chest tightness, subjective wheeze overt sinus or hb symptoms. No unusual exp hx or h/o childhood pna/ asthma or knowledge of premature birth.  Sleeping ok without nocturnal  or early am exacerbation  of respiratory  c/o's or need for noct saba. Also denies any obvious fluctuation of symptoms with weather or environmental changes or other aggravating or alleviating factors except as outlined above   Current Medications, Allergies, Complete Past Medical History, Past Surgical History, Family History, and Social History were reviewed in [employer] record.  ROS  The following are not active complaints unless bolded sore throat, dysphagia, dental problems, itching, sneezing,  nasal congestion  or excess/ purulent secretions, ear ache,   fever, chills, sweats, unintended wt loss, pleuritic or exertional cp, hemoptysis,  orthopnea pnd or leg swelling, presyncope, palpitations, heartburn, abdominal pain, anorexia, nausea, vomiting, diarrhea  or change in bowel or urinary habits, change in stools or urine, dysuria,hematuria,  rash, arthralgias, visual complaints, headache, numbness weakness or ataxia or problems with walking or coordination,  change in mood/affect or memory.               Objective:   Physical Exam   Elderly obese wf nad dry sounding cough 10/10/2013       243  Wt Readings from Last 3 Encounters:  09/08/13 248 lb (112.492 kg)  09/07/13 243 lb 4.8 oz (110.36 kg)  09/01/13 249 lb 12.8 oz (113.309 kg)       HEENT mild turbinate edema. Edentulous. Oropharynx no thrush or excess pnd or cobblestoning.  No JVD or cervical adenopathy. Mild accessory muscle hypertrophy. Trachea midline, nl thryroid. Chest was hyperinflated by percussion with diminished breath sounds and moderate increased exp time without wheeze. Hoover sign positive at mid inspiration. Regular rate and rhythm without murmur gallop or rub or increase P2 or edema.  Abd: no hsm, nl excursion. Ext warm without cyanosis or clubbing.    V/q 08/18/13 No segmental perfusion defects are seen to suggest pulmonary  thromboembolism. Area of mild decreased ventilation and perfusion in  the posterior left upper lobe, of unclear etiology. No other  abnormality.   Non contrast CT chest 08/18/13 neg  Lab Results  Component Value Date   PROBNP 66.0 09/01/2013     Recent Labs Lab 09/01/13 1707 09/07/13 1507  NA 139 141  K 4.5 4.2  CL 103 106  CO2 31 27  BUN 23 26*  CREATININE 1.1 1.10  GLUCOSE 108* 118*   Lab Results  Component Value Date   WBC 12.9* 08/18/2013   HGB 15.6* 08/18/2013   HCT 48.1* 08/18/2013   MCV 87.3 08/18/2013   PLT 202 08/18/2013        Assessment & Plan:

## 2013-10-10 NOTE — Progress Notes (Signed)
PFT done today by Alyse Low and Blair Hailey.

## 2013-10-10 NOTE — Assessment & Plan Note (Signed)
.  10/10/2013  Walked RA @ nl pace x  2 laps @ 185 ft each stopped due to  Sob, no desat  Symptoms are markedly disproportionate to objective findings and not clear this is a lung problem but pt does appear to have difficult airway management issues.   Adherence is always the initial "prime suspect" and is a multilayered concern that requires a "trust but verify" approach in every patient - starting with knowing how to use medications, especially inhalers, correctly, keeping up with refills and understanding the fundamental difference between maintenance and prns vs those medications only taken for a very short course and then stopped and not refilled.   ? Acid (or non-acid) GERD > always difficult to exclude as up to 75% of pts in some series report no assoc GI/ Heartburn symptoms> rec continue max    acid suppression and diet restrictions   ? Bb effects > try bisoprolol (see hbp)

## 2013-10-10 NOTE — Patient Instructions (Addendum)
Try bisoprolol 5 mg one daily and stop the metaprolol - call me between now and your next visit with Dr C if your blood pressure isn't doing great.  Try symbicort 80 Take 2 puffs first thing in am and then another 2 puffs about 12 hours later to see if notice any change in resp symptoms and if not this will be your new maintenance dose  We will set you up to see one of our sleep docs re your fatigue which may be related to sleep apnea

## 2013-10-22 ENCOUNTER — Encounter (HOSPITAL_COMMUNITY): Payer: Self-pay | Admitting: Emergency Medicine

## 2013-10-22 ENCOUNTER — Emergency Department (INDEPENDENT_AMBULATORY_CARE_PROVIDER_SITE_OTHER)
Admission: EM | Admit: 2013-10-22 | Discharge: 2013-10-22 | Disposition: A | Discharge status: Home / Self Care | Payer: Medicare Other | Source: Home / Self Care | Attending: Family Medicine | Admitting: Family Medicine

## 2013-10-22 DIAGNOSIS — N611 Abscess of the breast and nipple: Secondary | ICD-10-CM

## 2013-10-22 DIAGNOSIS — N61 Mastitis without abscess: Secondary | ICD-10-CM

## 2013-10-22 MED ORDER — MINOCYCLINE HCL 100 MG PO CAPS: 100.0000 mg | ORAL_CAPSULE | Freq: Two times a day (BID) | ORAL | Status: DC

## 2013-10-22 NOTE — ED Provider Notes (Addendum)
CSN: [id number]     Arrival date & time 10/22/13  1513 History   First MD Initiated Contact with Patient 10/22/13 1605     Chief Complaint  Patient presents with  . Cyst   (Consider location/radiation/quality/duration/timing/severity/associated sxs/prior Treatment) Patient is a 68 y.o. female presenting with abscess. The history is provided by the patient.  Abscess Location:  Torso Torso abscess location:  L chest Abscess quality: fluctuance, induration, painful and redness   Red streaking: no   Progression:  Worsening Pain details:    Severity:  Moderate   Progression:  Worsening (approx 2 mo ago had u/s at [facility] center for same, told was cyst, lesion continues but recently began to get worse.) Context: not skin injury   Relieved by:  None tried Worsened by:  Nothing tried Risk factors: no hx of MRSA and no prior abscess     Past Medical History  Diagnosis Date  . Asthma   . Hypertension   . Arthritis   . GERD (gastroesophageal reflux disease)   . Diverticulosis   . Anemia   . CAD (coronary artery disease)   . Fatty liver disease, nonalcoholic   . Internal hemorrhoids   . Gastritis   . Hyperplastic colon polyp   . Sleep apnea   . ICD (implantable cardiac defibrillator) in place   . Shortness of breath   . IBS (irritable bowel syndrome)   . Anxiety   . CHF (congestive heart failure)   . H/O hiatal hernia   . Obesity   . Back pain    Past Surgical History  Procedure Laterality Date  . Pacemaker insertion  2001    BS BivICD  . Cardiac defibrillator placement    . Appendectomy    . Knee surgery      right  . Ankle surgery      right  . Tubal ligation    . Cervical spine surgery      plate   . Coronary angiogram  2010  . Abdominal ultrasound  12/01/2011    Peripelvic cysts- #1- 2.4x1.9x2.3cm, #2-1.2x0.9x1.2cm  . Cardiac catheterization  05/17/1999    No significant coronary obstructive disease w/ mild 20% luminal irregularity of the first diag branch of the  LAD  . Cardiac catheterization  07/08/2002    No significant CAD, moderately depressed LV systolic function  . Cardiac catheterization Bilateral 04/26/2007    Normal findings, recommend medical therapy  . Cardiac catheterization  02/18/2008    Moderate CAD, would benefit from having a functional study, recommend continue medical therapy  . Cardiac catheterization  07/23/2012    Medical therapy  . Lexiscan myoview  11/14/2011    Mild-moderate defect seen in Mid Inferolateral and Mid Anterolateral regions-consistant w/ infarct/scar. No significant ischemia demonstrated.  . Transthoracic echocardiogram  07/23/2012    EF 55-60%, normal-mild   Family History  Problem Relation Age of Onset  . Breast cancer Mother   . Diabetes Mother   . Heart disease Maternal Grandmother   . Kidney disease Maternal Grandmother   . Diabetes Maternal Grandmother   . Glaucoma Maternal Aunt   . Heart disease Maternal Aunt   . Asthma Sister    History  Substance Use Topics  . Smoking status: Former Smoker -- 0.25 packs/day for 3 years    Types: Cigarettes    Quit date: 02/11/1968  . Smokeless tobacco: Never Used  . Alcohol Use: No   OB History   Grav Para Term Preterm Abortions TAB SAB Ect  Mult Living                 Review of Systems  Constitutional: Negative.   Skin: Positive for rash.    Allergies  Ivp dye; Shellfish-derived products; Sulfa antibiotics; and Iodine  Home Medications   Prior to Admission medications   Medication Sig Start Date End Date Taking? Authorizing Provider  amLODipine (NORVASC) 10 MG tablet Take 10 mg by mouth daily.   Yes Historical Provider, MD  aspirin 81 MG chewable tablet Chew 81 mg by mouth daily.   Yes Historical Provider, MD  bisoprolol (ZEBETA) 5 MG tablet Take 1 tablet (5 mg total) by mouth daily. 10/10/13  Yes Tanda Rockers, MD  budesonide-formoterol [facility]) 80-4.5 MCG/ACT inhaler Take 2 puffs first thing in am and then another 2 puffs about 12 hours later.  10/10/13  Yes Tanda Rockers, MD  cetirizine (ZYRTEC) 10 MG tablet Take 10 mg by mouth daily.    Yes Historical Provider, MD  clotrimazole (MYCELEX) 10 MG troche One every 4 hours as needed for mouth pain 09/08/13  Yes Tanda Rockers, MD  fluticasone [facility]) 50 MCG/ACT nasal spray Place 1 spray into the nose 2 (two) times daily as needed for rhinitis or allergies.    Yes Historical Provider, MD  furosemide (LASIX) 40 MG tablet Take 40 mg by mouth 2 (two) times daily.    Yes Historical Provider, MD  isosorbide mononitrate (IMDUR) 30 MG 24 hr tablet Take 1 tablet (30 mg total) by mouth daily. 09/01/13  Yes Scott T Kathlen Mody, PA-C  losartan (COZAAR) 100 MG tablet Take 1 tablet (100 mg total) by mouth daily. 08/15/13  Yes [name], MD  metoprolol succinate (TOPROL-XL) 50 MG 24 hr tablet Take 75 mg by mouth daily. Take with or immediately following a meal.   Yes Historical Provider, MD  nitroGLYCERIN (NITROSTAT) 0.4 MG SL tablet Place 0.4 mg under the tongue every 5 (five) minutes as needed for chest pain.   Yes Historical Provider, MD  omeprazole (PRILOSEC) 40 MG capsule Take 1 capsule by mouth daily. 07/19/13  Yes Historical Provider, MD  pravastatin (PRAVACHOL) 80 MG tablet Take 80 mg by mouth daily.    Yes Historical Provider, MD  spironolactone (ALDACTONE) 25 MG tablet TAKE ONE-HALF TABLET BY MOUTH TWICE DAILY 09/09/13  Yes Lorretta Harp, MD  albuterol (PROVENTIL HFA;VENTOLIN HFA) 108 (90 BASE) MCG/ACT inhaler Inhale 2 puffs into the lungs every 4 (four) hours as needed for wheezing.     Historical Provider, MD  amoxicillin (AMOXIL) 500 MG capsule Take 1 capsule (500 mg total) by mouth 3 (three) times daily. 10/25/13   Billy Fischer, MD  minocycline (MINOCIN,DYNACIN) 100 MG capsule Take 1 capsule (100 mg total) by mouth 2 (two) times daily. 10/22/13   Billy Fischer, MD  potassium chloride SA (K-DUR,KLOR-CON) 20 MEQ tablet Take 20 mEq by mouth daily. 09/01/13   Scott T Weaver, PA-C   BP 138/78  Pulse  79  Temp(Src) 98.5 F (36.9 C) (Oral)  Resp 16  SpO2 95% Physical Exam  Nursing note and vitals reviewed. Constitutional: She is oriented to person, place, and time. She appears well-developed and well-nourished. No distress.  Pulmonary/Chest: She exhibits tenderness.  Neurological: She is alert and oriented to person, place, and time.  Skin: Skin is warm and dry. There is erythema.  Fluctuant tender red abscess to left medial breast.    ED Course  INCISION AND DRAINAGE Date/Time: 10/22/2013 4:44 PM Performed  by: Elim Economou D Authorized by: Ihor Gully D Consent: Verbal consent obtained. Risks and benefits: risks, benefits and alternatives were discussed Consent given by: patient Type: abscess Body area: trunk Location details: left breast Local anesthetic: topical anesthetic Scalpel size: 11 Incision type: single straight Complexity: simple Drainage: purulent Drainage amount: moderate Wound treatment: wound left open Patient tolerance: Patient tolerated the procedure well with no immediate complications. Comments: cultured   (including critical care time) Labs Review Labs Reviewed  CULTURE, ROUTINE-ABSCESS    Imaging Review No results found.   MDM   1. Left breast abscess        Billy Fischer, MD 10/22/13 1651  Billy Fischer, MD 10/25/13 [phone]

## 2013-10-22 NOTE — Discharge Instructions (Signed)
Warm compress twice a day when you take the antibiotic, take all of medicine, return as needed. °

## 2013-10-22 NOTE — ED Notes (Signed)
Reports cyst/absces on left breast x 1 wk.  Having pain, swelling, and redness.   No treatments tried.

## 2013-10-25 ENCOUNTER — Telehealth (HOSPITAL_COMMUNITY): Payer: Self-pay | Admitting: *Deleted

## 2013-10-25 LAB — CULTURE, ROUTINE-ABSCESS: Gram Stain: NONE SEEN

## 2013-10-25 MED ORDER — AMOXICILLIN 500 MG PO CAPS: 500.0000 mg | ORAL_CAPSULE | Freq: Three times a day (TID) | ORAL | Status: DC

## 2013-10-25 NOTE — ED Notes (Signed)
Abscess culture L breast: Mod. Enterococcus species. Pt. Treated with Doxycycline. Lab shown to Dr. Juventino Slovak.  He said to call pt. And find out how she is doing.  She needs to stop the doxycycline and take Amoxicillin 500 mg. TID x 10 days.  He e-prescribed it to [facility] on [address]. I called pt. and on mobile and home numbers and left a message to call.  Call 1. Roselyn Meier 10/25/2013

## 2013-10-26 NOTE — ED Notes (Addendum)
I called pt. and she said she was not feeling any better, not much drainage from the abscess but states her chest is still painful.  Pt. instructed to stop the Doxycycline and take all of the Amoxicillin.  Pt. told where to pick up her Rx. Pt. told to get started on it ASAP and to come back if not improving with the Amoxicillin.

## 2013-10-26 NOTE — ED Notes (Signed)
Son answered and said this is his number and he gave me her number which we have listed as her home number. I told him I left her a message yesterday.  He said she can't access the messages, but he will call her now and tell her to pick up my call. 10/26/2013

## 2013-11-04 ENCOUNTER — Ambulatory Visit (INDEPENDENT_AMBULATORY_CARE_PROVIDER_SITE_OTHER): Payer: Medicare Other | Admitting: Cardiovascular Disease

## 2013-11-04 ENCOUNTER — Encounter: Payer: Self-pay | Admitting: Cardiovascular Disease

## 2013-11-04 VITALS — BP 136/84 | HR 87 | Resp 16 | Ht 62.0 in | Wt 251.5 lb

## 2013-11-04 DIAGNOSIS — Z950 Presence of cardiac pacemaker: Secondary | ICD-10-CM

## 2013-11-04 DIAGNOSIS — I428 Other cardiomyopathies: Secondary | ICD-10-CM

## 2013-11-04 DIAGNOSIS — I5031 Acute diastolic (congestive) heart failure: Secondary | ICD-10-CM | POA: Insufficient documentation

## 2013-11-04 DIAGNOSIS — I509 Heart failure, unspecified: Secondary | ICD-10-CM

## 2013-11-04 DIAGNOSIS — I5033 Acute on chronic diastolic (congestive) heart failure: Secondary | ICD-10-CM | POA: Insufficient documentation

## 2013-11-04 DIAGNOSIS — I2510 Atherosclerotic heart disease of native coronary artery without angina pectoris: Secondary | ICD-10-CM

## 2013-11-04 DIAGNOSIS — E782 Mixed hyperlipidemia: Secondary | ICD-10-CM

## 2013-11-04 LAB — MDC_IDC_ENUM_SESS_TYPE_INCLINIC
Battery Remaining Longevity: 10
Brady Statistic RA Percent Paced: 12 %
Brady Statistic RV Percent Paced: 100 %
Implantable Pulse Generator Serial Number: 100731
Lead Channel Impedance Value: 1607 Ohm
Lead Channel Impedance Value: 649 Ohm
Lead Channel Impedance Value: 896 Ohm
Lead Channel Pacing Threshold Amplitude: 0.7 V
Lead Channel Pacing Threshold Amplitude: 0.7 V
Lead Channel Pacing Threshold Amplitude: 1.8 V
Lead Channel Pacing Threshold Pulse Width: 0.4 ms
Lead Channel Pacing Threshold Pulse Width: 0.4 ms
Lead Channel Pacing Threshold Pulse Width: 0.8 ms
Lead Channel Sensing Intrinsic Amplitude: 17 mV
Lead Channel Sensing Intrinsic Amplitude: 5.3 mV
Lead Channel Setting Pacing Amplitude: 2 V
Lead Channel Setting Pacing Amplitude: 2 V
Lead Channel Setting Pacing Amplitude: 2.4 V
Lead Channel Setting Pacing Pulse Width: 0.4 ms
Lead Channel Setting Pacing Pulse Width: 0.8 ms
Lead Channel Setting Sensing Sensitivity: 2.5 mV
Lead Channel Setting Sensing Sensitivity: 2.5 mV
Zone Setting Detection Interval: 375 ms

## 2013-11-04 MED ORDER — METOLAZONE 2.5 MG PO TABS: ORAL_TABLET | ORAL | Status: DC

## 2013-11-04 MED ORDER — FUROSEMIDE 40 MG PO TABS: 40.0000 mg | ORAL_TABLET | Freq: Two times a day (BID) | ORAL | Status: DC

## 2013-11-04 NOTE — Patient Instructions (Signed)
Take Zaroxolyn 2.5mg  daily x 3 days only.  Increase Potassium 31meq to twice a day x 3 days.  Your physician recommends that you weigh, daily, at the same time every day, and in the same amount of clothing. Please record your daily weights on the handout provided and bring it to your next appointment.   Your physician recommends that you schedule a follow-up appointment in: ONE WEEK with PA or Dr. Loletha Grayer.

## 2013-11-04 NOTE — Progress Notes (Signed)
Patient ID: [name], female   DOB: [birth year], 68 y.o.   MRN: [id number]      Reason for office visit Dyspnea  [name] has had progressively worsening exertional dyspnea, fatigue and ankle swelling. She has NYHA functional class III status. She weighs 10 pounds more than she did a year ago. He weighs 8.5 pounds more than she did just 4 weeks ago. Her dry weight is probably around 251 pounds. It is likely that she has had an increase in sodium intake. She is having financial problems and has been eating a lot of semi processed food such as luncheon meats.   Pacemaker interrogation of her CRT-P device shows normal function. Battery is at "the beginning of life". All the parameters are unchanged. She is pacemaker dependent. He has been only one very brief episode of nonsustained ventricular tachycardia (6 beats on July 22). Biventricular pacing occurs 100% of the time. There has been no atrial fibrillation.  [name] has a long-standing history of nonischemic cardiomyopathy, hypertension and reactive airway disease. Her most recent echocardiogram was performed in June 2014 and showed normal left ventricular systolic function with an ejection fraction of 55-60%. The mitral inflow suggested normal filling pressures. She had a low risk nuclear scan in October of 2013.  She initially received a dual-chamber pacemaker in 2001 for second degree AV block. She then developed congestive heart failure. At one point left ventricular ejection fraction was severely depressed and her pacemaker was upgraded to a CRT-D device in 2008. She was a "hyperresponder" to CRT and EF returned to normal. Sequentially, at the time of her generator change out in 2014 her biventricular defibrillator generator was changed out for a biventricular pacemaker [facility] dual-chamber). She has minor coronary atherosclerosis with lesions between 20% and 50% scattered throughout all 3 coronary arteries but does  not have a history of myocardial infarction. She is morbidly obese and recently has been told that her glucose is borderline high although she has never been diagnosed with full-blown diabetes mellitus.   Allergies  Allergen Reactions  . Ivp Dye [Iodinated Diagnostic Agents] Shortness Of Breath  . Shellfish-Derived Products Anaphylaxis  . Sulfa Antibiotics Shortness Of Breath  . Iodine Hives    Current Outpatient Prescriptions  Medication Sig Dispense Refill  . albuterol (PROVENTIL HFA;VENTOLIN HFA) 108 (90 BASE) MCG/ACT inhaler Inhale 2 puffs into the lungs every 4 (four) hours as needed for wheezing.       [name] amLODipine (NORVASC) 10 MG tablet Take 10 mg by mouth daily.      [name] amoxicillin (AMOXIL) 500 MG capsule Take 1 capsule (500 mg total) by mouth 3 (three) times daily.  30 capsule  0  . aspirin 81 MG chewable tablet Chew 81 mg by mouth daily.      . bisoprolol (ZEBETA) 5 MG tablet Take 1 tablet (5 mg total) by mouth daily.  30 tablet  11  . budesonide-formoterol (SYMBICORT) 80-4.5 MCG/ACT inhaler Take 2 puffs first thing in am and then another 2 puffs about 12 hours later.  1 Inhaler  11  . cetirizine (ZYRTEC) 10 MG tablet Take 10 mg by mouth daily.       . clotrimazole (MYCELEX) 10 MG troche One every 4 hours as needed for mouth pain  12 tablet  11  . fluticasone (FLONASE) 50 MCG/ACT nasal spray Place 1 spray into the nose 2 (two) times daily as needed for rhinitis or allergies.       [name]  furosemide (LASIX) 40 MG tablet Take 1 tablet (40 mg total) by mouth 2 (two) times daily.  60 tablet  5  . isosorbide mononitrate (IMDUR) 30 MG 24 hr tablet Take 1 tablet (30 mg total) by mouth daily.  30 tablet  11  . losartan (COZAAR) 100 MG tablet Take 1 tablet (100 mg total) by mouth daily.  90 tablet  2  . minocycline (MINOCIN,DYNACIN) 100 MG capsule Take 1 capsule (100 mg total) by mouth 2 (two) times daily.  20 capsule  0  . nitroGLYCERIN (NITROSTAT) 0.4 MG SL tablet Place 0.4 mg under the tongue  every 5 (five) minutes as needed for chest pain.      [name] omeprazole (PRILOSEC) 40 MG capsule Take 1 capsule by mouth daily.      . potassium chloride SA (K-DUR,KLOR-CON) 20 MEQ tablet Take 20 mEq by mouth daily.      . pravastatin (PRAVACHOL) 80 MG tablet Take 80 mg by mouth daily.       [name] spironolactone (ALDACTONE) 25 MG tablet TAKE ONE-HALF TABLET BY MOUTH TWICE DAILY  30 tablet  11  . metolazone (ZAROXOLYN) 2.5 MG tablet Take one tablet x 3 days then as directed.  30 tablet  0   No current facility-administered medications for this visit.    Past Medical History  Diagnosis Date  . Asthma   . Hypertension   . Arthritis   . GERD (gastroesophageal reflux disease)   . Diverticulosis   . Anemia   . CAD (coronary artery disease)   . Fatty liver disease, nonalcoholic   . Internal hemorrhoids   . Gastritis   . Hyperplastic colon polyp   . Sleep apnea   . ICD (implantable cardiac defibrillator) in place   . Shortness of breath   . IBS (irritable bowel syndrome)   . Anxiety   . CHF (congestive heart failure)   . H/O hiatal hernia   . Obesity   . Back pain     Past Surgical History  Procedure Laterality Date  . Pacemaker insertion  2001    BS BivICD  . Cardiac defibrillator placement    . Appendectomy    . Knee surgery      right  . Ankle surgery      right  . Tubal ligation    . Cervical spine surgery      plate   . Coronary angiogram  2010  . Abdominal ultrasound  12/01/2011    Peripelvic cysts- #1- 2.4x1.9x2.3cm, #2-1.2x0.9x1.2cm  . Cardiac catheterization  05/17/1999    No significant coronary obstructive disease w/ mild 20% luminal irregularity of the first diag branch of the LAD  . Cardiac catheterization  07/08/2002    No significant CAD, moderately depressed LV systolic function  . Cardiac catheterization Bilateral 04/26/2007    Normal findings, recommend medical therapy  . Cardiac catheterization  02/18/2008    Moderate CAD, would benefit from having a functional  study, recommend continue medical therapy  . Cardiac catheterization  07/23/2012    Medical therapy  . Lexiscan myoview  11/14/2011    Mild-moderate defect seen in Mid Inferolateral and Mid Anterolateral regions-consistant w/ infarct/scar. No significant ischemia demonstrated.  . Transthoracic echocardiogram  07/23/2012    EF 55-60%, normal-mild    Family History  Problem Relation Age of Onset  . Breast cancer Mother   . Diabetes Mother   . Heart disease Maternal Grandmother   . Kidney disease Maternal Grandmother   . Diabetes Maternal  Grandmother   . Glaucoma Maternal Aunt   . Heart disease Maternal Aunt   . Asthma Sister     History   Social History  . Marital Status: Widowed    Spouse Name: N/A    Number of Children: 34  . Years of Education: N/A   Occupational History  . 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 Smoking status: Former Smoker -- 0.25 packs/day for 3 years    Types: Cigarettes    Quit date: 02/11/1968  . Smokeless tobacco: Never Used  . Alcohol Use: No  . Drug Use: No  . Sexual Activity: No   Other Topics Concern  . Not on file   Social History Narrative   Widowed last year. She lives with her two sons.    Review of systems: Shortness of breath with minimal exertion, NYHA functional class III, increased weight, lower extremity edema. She also complains of "pinched nerve" type sensation in her left cervical and infraclavicular area.  The patient specifically denies any chest pain at rest or with exertion, dyspnea at rest, orthopnea, paroxysmal nocturnal dyspnea, syncope, palpitations, focal neurological deficits, intermittent claudication, unexplained weight gain, cough, hemoptysis or wheezing.  The patient also denies abdominal pain, nausea, vomiting, dysphagia, diarrhea, constipation, polyuria, polydipsia, dysuria, hematuria, frequency, urgency, abnormal bleeding or bruising, fever, chills, unexpected weight changes, mood  swings, change in skin or hair texture, change in voice quality, auditory or visual problems, allergic reactions or rashes, new musculoskeletal complaints other than usual "aches and pains".   PHYSICAL EXAM BP 136/84  Pulse 87  Resp 16  Ht 5\' 2"  (1.575 m)  Wt 114.08 kg (251 lb 8 oz)  BMI 45.99 kg/m2  General: Alert, oriented x3, no distress. Morbidly obese Head: no evidence of trauma, PERRL, EOMI, no exophtalmos or lid lag, no myxedema, no xanthelasma; normal ears, nose and oropharynx Neck: normal jugular venous pulsations and no hepatojugular reflux; brisk carotid pulses without delay and no carotid bruits Chest: clear to auscultation, no signs of consolidation by percussion or palpation, normal fremitus, symmetrical and full respiratory excursions; healthy right subclavian pacemaker site Cardiovascular: normal position and quality of the apical impulse, regular rhythm, normal first and second heart sounds, no murmurs, rubs or gallops Abdomen: no tenderness or distention, no masses by palpation, no abnormal pulsatility or arterial bruits, normal bowel sounds, no hepatosplenomegaly Extremities: no clubbing, cyanosis; 2+ pedal symmetrical edema; 2+ radial, ulnar and brachial pulses bilaterally; 2+ right femoral, posterior tibial and dorsalis pedis pulses; 2+ left femoral, posterior tibial and dorsalis pedis pulses; no subclavian or femoral bruits Neurological: grossly nonfocal   EKG: Atrial sensed, biventricular paced  Lipid Panel     Component Value Date/Time   CHOL 265* 07/23/2012 0645   TRIG 169* 07/23/2012 0645   HDL 50 07/23/2012 0645   CHOLHDL 5.3 07/23/2012 0645   VLDL 34 07/23/2012 0645   LDLCALC 181* 07/23/2012 0645    BMET    Component Value Date/Time   NA 141 09/07/2013 1507   K 4.2 09/07/2013 1507   CL 106 09/07/2013 1507   CO2 27 09/07/2013 1507   GLUCOSE 118* 09/07/2013 1507   BUN 26* 09/07/2013 1507   CREATININE 1.10 09/07/2013 1507   CREATININE 1.1 09/01/2013 1707    CALCIUM 9.3 09/07/2013 1507   GFRNONAA 58* 08/18/2013 1155   GFRAA 67* 08/18/2013 1155     ASSESSMENT AND PLAN Acute on chronic diastolic heart failure She has clinical evidence of hypervolemia, possibly  due to sodium indiscretion. Will add Zaroxolyn 2.5 mg daily for 3 days and also double her usual potassium supplement. Asked her to pay more attention to sodium restriction and to weigh herself daily. Bring her back in a week's time to make sure she is improving. If she does not, we should repeat an echocardiogram. Cardiomyopathy, nonischemic, Bi V responder, EF >55% by 2D Oct 2013  She is on appropriate medications including an angiotensin receptor blocker and beta blocker. Normal function of her biventricular pacemaker Unspecified essential hypertension  Fair blood pressure control Mixed hypercholesterolemia and hypertriglyceridemia  When she returns for her followup appointment I would like to repeat a metabolic panel as well as lipid profile  Meds ordered this encounter  Medications  . metolazone (ZAROXOLYN) 2.5 MG tablet    Sig: Take one tablet x 3 days then as directed.    Dispense:  30 tablet    Refill:  0  . furosemide (LASIX) 40 MG tablet    Sig: Take 1 tablet (40 mg total) by mouth 2 (two) times daily.    Dispense:  60 tablet    Refill:  [place] Sparkles Mcneely, MD, [facility] [phone] office [phone] pager

## 2013-11-07 ENCOUNTER — Telehealth: Payer: Self-pay | Admitting: *Deleted

## 2013-11-07 DIAGNOSIS — R0602 Shortness of breath: Secondary | ICD-10-CM

## 2013-11-07 DIAGNOSIS — Z79899 Other long term (current) drug therapy: Secondary | ICD-10-CM

## 2013-11-07 DIAGNOSIS — E782 Mixed hyperlipidemia: Secondary | ICD-10-CM

## 2013-11-07 NOTE — Telephone Encounter (Signed)
Message copied by Tressa Busman on Mon Nov 07, 2013  1:04 PM ------      Message from: Sanda Klein      Created: Fri Nov 04, 2013  2:26 PM       Please have her get a lipid profile and a BMET and BNP before her next appointment ------

## 2013-11-07 NOTE — Telephone Encounter (Signed)
Patient notified to get fasting labs done before her appt. Thursday.  Voiced understanding - order placed.

## 2013-11-08 LAB — BASIC METABOLIC PANEL
BUN: 24 mg/dL — ABNORMAL HIGH (ref 6–23)
CO2: 31 mEq/L (ref 19–32)
Calcium: 9.6 mg/dL (ref 8.4–10.5)
Chloride: 96 mEq/L (ref 96–112)
Creat: 1.32 mg/dL — ABNORMAL HIGH (ref 0.50–1.10)
Glucose, Bld: 125 mg/dL — ABNORMAL HIGH (ref 70–99)
Potassium: 3.7 mEq/L (ref 3.5–5.3)
Sodium: 138 mEq/L (ref 135–145)

## 2013-11-10 ENCOUNTER — Ambulatory Visit (INDEPENDENT_AMBULATORY_CARE_PROVIDER_SITE_OTHER): Payer: Medicare Other | Admitting: Cardiovascular Disease

## 2013-11-10 ENCOUNTER — Encounter: Payer: Self-pay | Admitting: Cardiovascular Disease

## 2013-11-10 ENCOUNTER — Other Ambulatory Visit: Payer: Self-pay

## 2013-11-10 VITALS — BP 121/76 | HR 75 | Resp 20 | Ht 62.0 in | Wt 244.1 lb

## 2013-11-10 DIAGNOSIS — I509 Heart failure, unspecified: Secondary | ICD-10-CM

## 2013-11-10 DIAGNOSIS — E782 Mixed hyperlipidemia: Secondary | ICD-10-CM

## 2013-11-10 DIAGNOSIS — I5033 Acute on chronic diastolic (congestive) heart failure: Secondary | ICD-10-CM

## 2013-11-10 DIAGNOSIS — E785 Hyperlipidemia, unspecified: Secondary | ICD-10-CM

## 2013-11-10 DIAGNOSIS — I429 Cardiomyopathy, unspecified: Secondary | ICD-10-CM

## 2013-11-10 DIAGNOSIS — G4730 Sleep apnea, unspecified: Secondary | ICD-10-CM

## 2013-11-10 DIAGNOSIS — I10 Essential (primary) hypertension: Secondary | ICD-10-CM

## 2013-11-10 DIAGNOSIS — R0602 Shortness of breath: Secondary | ICD-10-CM

## 2013-11-10 DIAGNOSIS — E249 Cushing's syndrome, unspecified: Secondary | ICD-10-CM

## 2013-11-10 DIAGNOSIS — Z79899 Other long term (current) drug therapy: Secondary | ICD-10-CM

## 2013-11-10 DIAGNOSIS — I2510 Atherosclerotic heart disease of native coronary artery without angina pectoris: Secondary | ICD-10-CM

## 2013-11-10 DIAGNOSIS — Z950 Presence of cardiac pacemaker: Secondary | ICD-10-CM

## 2013-11-10 DIAGNOSIS — I5022 Chronic systolic (congestive) heart failure: Secondary | ICD-10-CM

## 2013-11-10 DIAGNOSIS — I428 Other cardiomyopathies: Secondary | ICD-10-CM

## 2013-11-10 DIAGNOSIS — I200 Unstable angina: Secondary | ICD-10-CM

## 2013-11-10 MED ORDER — POTASSIUM CHLORIDE CRYS ER 20 MEQ PO TBCR: 20.0000 meq | EXTENDED_RELEASE_TABLET | Freq: Every day | ORAL | Status: DC

## 2013-11-10 NOTE — Progress Notes (Signed)
Patient ID: [name], female   DOB: 1945/05/03, 68 y.o.   MRN: [id number]     Reason for office visit Acute on chronic heart failure  Despite roughly 7 pounds reduction in weight following the increased diuretic dose, Mrs. Mangieri continues to have severe exertional dyspnea, functional class IIIB. She is actually already lower than our previously estimated "dry weight".  Ankle swelling is better.  Mrs. Ancheta has a long-standing history of nonischemic cardiomyopathy, hypertension and reactive airway disease. Her most recent echocardiogram was performed in June 2014 and showed normal left ventricular systolic function with an ejection fraction of 55-60%. The mitral inflow suggested normal filling pressures. She had a low risk nuclear scan in October of 2013.   She initially received a dual-chamber pacemaker in 2001 for second degree AV block. She then developed congestive heart failure. At one point left ventricular ejection fraction was severely depressed and her pacemaker was upgraded to a CRT-D device in 2008. She was a "hyperresponder" to CRT and EF returned to normal. Sequentially, at the time of her generator change out in 2014 her biventricular defibrillator generator was changed out for a biventricular pacemaker [facility] dual-chamber). Device check on September 25 showed excellent biventricular pacing, 100% and no recent episodes of atrial fibrillation or meaningful ventricular arrhythmia.  She has minor coronary atherosclerosis with lesions between 20% and 50% scattered throughout all 3 coronary arteries but does not have a history of myocardial infarction. She is morbidly obese and recently has been told that her glucose is borderline high although she has never been diagnosed with full-blown diabetes mellitus.    Allergies  Allergen Reactions  . Ivp Dye [Iodinated Diagnostic Agents] Shortness Of Breath  . Shellfish-Derived Products Anaphylaxis  . Sulfa  Antibiotics Shortness Of Breath  . Iodine Hives    Current Outpatient Prescriptions  Medication Sig Dispense Refill  . albuterol (PROVENTIL HFA;VENTOLIN HFA) 108 (90 BASE) MCG/ACT inhaler Inhale 2 puffs into the lungs every 4 (four) hours as needed for wheezing.       [name] amLODipine (NORVASC) 10 MG tablet Take 10 mg by mouth daily.      [name] amoxicillin (AMOXIL) 500 MG capsule Take 1 capsule (500 mg total) by mouth 3 (three) times daily.  30 capsule  0  . aspirin 81 MG chewable tablet Chew 81 mg by mouth daily.      . bisoprolol (ZEBETA) 5 MG tablet Take 1 tablet (5 mg total) by mouth daily.  30 tablet  11  . budesonide-formoterol (SYMBICORT) 80-4.5 MCG/ACT inhaler Take 2 puffs first thing in am and then another 2 puffs about 12 hours later.  1 Inhaler  11  . cetirizine (ZYRTEC) 10 MG tablet Take 10 mg by mouth daily.       . clotrimazole (MYCELEX) 10 MG troche One every 4 hours as needed for mouth pain  12 tablet  11  . fluticasone (FLONASE) 50 MCG/ACT nasal spray Place 1 spray into the nose 2 (two) times daily as needed for rhinitis or allergies.       . furosemide (LASIX) 40 MG tablet Take 1 tablet (40 mg total) by mouth 2 (two) times daily.  60 tablet  5  . isosorbide mononitrate (IMDUR) 30 MG 24 hr tablet Take 1 tablet (30 mg total) by mouth daily.  30 tablet  11  . losartan (COZAAR) 100 MG tablet Take 1 tablet (100 mg total) by mouth daily.  90 tablet  2  . metolazone (ZAROXOLYN) 2.5  MG tablet Take one tablet x 3 days then as directed.  30 tablet  0  . minocycline (MINOCIN,DYNACIN) 100 MG capsule Take 1 capsule (100 mg total) by mouth 2 (two) times daily.  20 capsule  0  . nitroGLYCERIN (NITROSTAT) 0.4 MG SL tablet Place 0.4 mg under the tongue every 5 (five) minutes as needed for chest pain.      [name] omeprazole (PRILOSEC) 40 MG capsule Take 1 capsule by mouth daily.      . potassium chloride SA (K-DUR,KLOR-CON) 20 MEQ tablet Take 20 mEq by mouth daily.      . pravastatin (PRAVACHOL) 80 MG  tablet Take 80 mg by mouth daily.       [name] spironolactone (ALDACTONE) 25 MG tablet TAKE ONE-HALF TABLET BY MOUTH TWICE DAILY  30 tablet  11   No current facility-administered medications for this visit.    Past Medical History  Diagnosis Date  . Asthma   . Hypertension   . Arthritis   . GERD (gastroesophageal reflux disease)   . Diverticulosis   . Anemia   . CAD (coronary artery disease)   . Fatty liver disease, nonalcoholic   . Internal hemorrhoids   . Gastritis   . Hyperplastic colon polyp   . Sleep apnea   . ICD (implantable cardiac defibrillator) in place   . Shortness of breath   . IBS (irritable bowel syndrome)   . Anxiety   . CHF (congestive heart failure)   . H/O hiatal hernia   . Obesity   . Back pain     Past Surgical History  Procedure Laterality Date  . Pacemaker insertion  2001    BS BivICD  . Cardiac defibrillator placement    . Appendectomy    . Knee surgery      right  . Ankle surgery      right  . Tubal ligation    . Cervical spine surgery      plate   . Coronary angiogram  2010  . Abdominal ultrasound  12/01/2011    Peripelvic cysts- #1- 2.4x1.9x2.3cm, #2-1.2x0.9x1.2cm  . Cardiac catheterization  05/17/1999    No significant coronary obstructive disease w/ mild 20% luminal irregularity of the first diag branch of the LAD  . Cardiac catheterization  07/08/2002    No significant CAD, moderately depressed LV systolic function  . Cardiac catheterization Bilateral 04/26/2007    Normal findings, recommend medical therapy  . Cardiac catheterization  02/18/2008    Moderate CAD, would benefit from having a functional study, recommend continue medical therapy  . Cardiac catheterization  07/23/2012    Medical therapy  . Lexiscan myoview  11/14/2011    Mild-moderate defect seen in Mid Inferolateral and Mid Anterolateral regions-consistant w/ infarct/scar. No significant ischemia demonstrated.  . Transthoracic echocardiogram  07/23/2012    EF 55-60%, normal-mild      Family History  Problem Relation Age of Onset  . Breast cancer Mother   . Diabetes Mother   . Heart disease Maternal Grandmother   . Kidney disease Maternal Grandmother   . Diabetes Maternal Grandmother   . Glaucoma Maternal Aunt   . Heart disease Maternal Aunt   . Asthma Sister     History   Social History  . Marital Status: Widowed    Spouse Name: N/A    Number of Children: 21  . Years of Education: N/A   Occupational History  . 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 Smoking  status: Former Smoker -- 0.25 packs/day for 3 years    Types: Cigarettes    Quit date: 02/11/1968  . Smokeless tobacco: Never Used  . Alcohol Use: No  . Drug Use: No  . Sexual Activity: No   Other Topics Concern  . Not on file   Social History Narrative   Widowed last year. She lives with her two sons.    Review of systems: Shortness of breath with minimal exertion, NYHA functional class III, increased weight, lower extremity edema. She also complains of "pinched nerve" type sensation in her left cervical and infraclavicular area.  The patient specifically denies any chest pain at rest or with exertion, dyspnea at rest, orthopnea, paroxysmal nocturnal dyspnea, syncope, palpitations, focal neurological deficits, intermittent claudication, unexplained weight gain, cough, hemoptysis or wheezing.  The patient also denies abdominal pain, nausea, vomiting, dysphagia, diarrhea, constipation, polyuria, polydipsia, dysuria, hematuria, frequency, urgency, abnormal bleeding or bruising, fever, chills, unexpected weight changes, mood swings, change in skin or hair texture, change in voice quality, auditory or visual problems, allergic reactions or rashes, new musculoskeletal complaints other than usual "aches and pains".   PHYSICAL EXAM BP 121/76  Pulse 75  Resp 20  Ht 5\' 2"  (1.575 m)  Wt 110.723 kg (244 lb 1.6 oz)  BMI 44.64 kg/m2 General: Alert, oriented x3, no  distress. Morbidly obese  She has multiple cushingoid features including moon facies and buffalo hump and recently has developed hyperglycemia Head: no evidence of trauma, PERRL, EOMI, no exophtalmos or lid lag, no myxedema, no xanthelasma; normal ears, nose and oropharynx  Neck: normal jugular venous pulsations and no hepatojugular reflux; brisk carotid pulses without delay and no carotid bruits  Chest: clear to auscultation, no signs of consolidation by percussion or palpation, normal fremitus, symmetrical and full respiratory excursions; healthy right subclavian pacemaker site  Cardiovascular: normal position and quality of the apical impulse, regular rhythm, normal first and second heart sounds, no murmurs, rubs or gallops  Abdomen: no tenderness or distention, no masses by palpation, no abnormal pulsatility or arterial bruits, normal bowel sounds, no hepatosplenomegaly  Extremities: no clubbing, cyanosis; 2+ pedal symmetrical edema; 2+ radial, ulnar and brachial pulses bilaterally; 2+ right femoral, posterior tibial and dorsalis pedis pulses; 2+ left femoral, posterior tibial and dorsalis pedis pulses; no subclavian or femoral bruits  Neurological: grossly nonfocal  EKG: Atrial sensed, biventricular paced    EKG: Atrial sensed biventricular paced. QRS complexes positive in leads V1 and V2, consistent with left ventricular pacing. The QRS complexes are similar on current electrocardiograms to the tracings performed before her last generator change.  Lipid Panel     Component Value Date/Time   CHOL 265* 07/23/2012 0645   TRIG 169* 07/23/2012 0645   HDL 50 07/23/2012 0645   CHOLHDL 5.3 07/23/2012 0645   VLDL 34 07/23/2012 0645   LDLCALC 181* 07/23/2012 0645    BMET    Component Value Date/Time   NA 138 11/08/2013 0847   K 3.7 11/08/2013 0847   CL 96 11/08/2013 0847   CO2 31 11/08/2013 0847   GLUCOSE 125* 11/08/2013 0847   BUN 24* 11/08/2013 0847   CREATININE 1.32* 11/08/2013 0847   CREATININE  1.1 09/01/2013 1707   CALCIUM 9.6 11/08/2013 0847   GFRNONAA 58* 08/18/2013 1155   GFRAA 67* 08/18/2013 1155     ASSESSMENT AND PLAN Acute on chronic diastolic heart failure  She remains dyspneic despite the fact that she has responded well to diuretics. Will again add Zaroxolyn 2.5  mg daily for 3 days and also double her usual potassium supplement. Asked her to continue to weigh herself daily. Bring her back in a week's time to make sure she is improving. We should repeat an echocardiogram.  His left ventricular systolic function is normal and the echocardiogram does not show clear evidence of elevated filling pressures, her shortness of breath may be pulmonary in etiology. Ultimately, she may need a right left heart catheterization to definitively establish the cause of her dyspnea Cardiomyopathy, nonischemic, Bi V responder, EF >55% by 2D Oct 2013  She is on appropriate medications including an angiotensin receptor blocker and beta blocker. Normal function of her biventricular pacemaker. She had a "downgrade" to CRT-P in 2014, since her ejection fraction had rebounded so well. This may have slightly changed the vector of the left ventricular pacing, but the QRS complex looks very similar on the 12-lead ECG. The QRS complex is quite broad 160 ms. Consider repeat AV and VV optimization. Unspecified essential hypertension  Fair blood pressure control  Mixed hypercholesterolemia and hypertriglyceridemia  Repeat a lipid profile  Cushing's syndrome? This is suggested by her physical exam and the recent development of hyperglycemia, although this could all be simply obesity related. Might have to consider a dexamethasone suppression test.  Orders Placed This Encounter  Procedures  . Basic metabolic panel  . Brain natriuretic peptide  . Cortisol  . EKG 12-Lead  . 2D Echocardiogram without contrast   Zaharah Amir  Sanda Klein, MD, [facility] [phone] office [phone]  pager

## 2013-11-10 NOTE — Telephone Encounter (Signed)
Rx sent to pharmacy   

## 2013-11-10 NOTE — Patient Instructions (Addendum)
Your physician has requested that you have an echocardiogram. Echocardiography is a painless test that uses sound waves to create images of your heart. It provides your doctor with information about the size and shape of your heart and how well your heart's chambers and valves are working. This procedure takes approximately one hour. There are no restrictions for this procedure.  TAKE Zaroxolyn 2.5 daily x 3 days.  INCREASE Potassium 17meq to twice a day x 3 days.  Your physician recommends that you return for lab work in: Monday at [facility].  Dr. [name] recommends that you schedule a follow-up appointment in: After the echo and labs are completed.

## 2013-11-14 ENCOUNTER — Telehealth: Payer: Self-pay | Admitting: Cardiovascular Disease

## 2013-11-14 NOTE — Telephone Encounter (Signed)
Pt did not come for her lab work today. She wants to know if she can come Wednesday when she comes for her echo?

## 2013-11-14 NOTE — Telephone Encounter (Signed)
OK to wait until Wednesday to get blood work done.  Patient notified and voiced understanding.

## 2013-11-16 ENCOUNTER — Ambulatory Visit (HOSPITAL_COMMUNITY)
Admission: RE | Admit: 2013-11-16 | Discharge: 2013-11-16 | Disposition: A | Discharge status: Home / Self Care | Payer: Medicare Other | Source: Ambulatory Visit | Attending: Cardiovascular Disease | Admitting: Cardiovascular Disease

## 2013-11-16 DIAGNOSIS — I369 Nonrheumatic tricuspid valve disorder, unspecified: Secondary | ICD-10-CM

## 2013-11-16 DIAGNOSIS — I428 Other cardiomyopathies: Secondary | ICD-10-CM | POA: Diagnosis not present

## 2013-11-16 DIAGNOSIS — I10 Essential (primary) hypertension: Secondary | ICD-10-CM | POA: Insufficient documentation

## 2013-11-16 DIAGNOSIS — R0600 Dyspnea, unspecified: Secondary | ICD-10-CM

## 2013-11-16 DIAGNOSIS — Z87891 Personal history of nicotine dependence: Secondary | ICD-10-CM | POA: Insufficient documentation

## 2013-11-16 DIAGNOSIS — I509 Heart failure, unspecified: Secondary | ICD-10-CM | POA: Insufficient documentation

## 2013-11-16 DIAGNOSIS — Z8249 Family history of ischemic heart disease and other diseases of the circulatory system: Secondary | ICD-10-CM | POA: Insufficient documentation

## 2013-11-16 DIAGNOSIS — I5022 Chronic systolic (congestive) heart failure: Secondary | ICD-10-CM

## 2013-11-16 LAB — CORTISOL: Cortisol, Plasma: 11.8 ug/dL

## 2013-11-16 LAB — BASIC METABOLIC PANEL
BUN: 27 mg/dL — ABNORMAL HIGH (ref 6–23)
CO2: 30 mEq/L (ref 19–32)
Calcium: 9.1 mg/dL (ref 8.4–10.5)
Chloride: 101 mEq/L (ref 96–112)
Creat: 1.37 mg/dL — ABNORMAL HIGH (ref 0.50–1.10)
Glucose, Bld: 135 mg/dL — ABNORMAL HIGH (ref 70–99)
Potassium: 3.7 mEq/L (ref 3.5–5.3)
Sodium: 141 mEq/L (ref 135–145)

## 2013-11-16 LAB — LIPID PANEL
Cholesterol: 234 mg/dL — ABNORMAL HIGH (ref 0–200)
HDL: 49 mg/dL (ref >39)
LDL Cholesterol: 148 mg/dL — ABNORMAL HIGH (ref 0–99)
Total CHOL/HDL Ratio: 4.8 Ratio
Triglycerides: 187 mg/dL — ABNORMAL HIGH (ref <150)
VLDL: 37 mg/dL (ref 0–40)

## 2013-11-16 LAB — BRAIN NATRIURETIC PEPTIDE: Brain Natriuretic Peptide: 136.8 pg/mL — ABNORMAL HIGH (ref 0.0–100.0)

## 2013-11-16 NOTE — Progress Notes (Signed)
2D Echocardiogram Complete.  11/16/2013   Libby Goehring, RDCS  

## 2013-11-18 ENCOUNTER — Encounter (HOSPITAL_COMMUNITY): Payer: Self-pay | Admitting: Emergency Medicine

## 2013-11-18 ENCOUNTER — Institutional Professional Consult (permissible substitution): Payer: Medicare Other | Admitting: Pulmonary Disease

## 2013-11-18 ENCOUNTER — Emergency Department (HOSPITAL_COMMUNITY): Payer: Medicare Other

## 2013-11-18 ENCOUNTER — Emergency Department (HOSPITAL_COMMUNITY)
Admission: EM | Admit: 2013-11-18 | Discharge: 2013-11-18 | Disposition: A | Discharge status: Home / Self Care | Payer: Medicare Other | Attending: Emergency Medicine | Admitting: Emergency Medicine

## 2013-11-18 DIAGNOSIS — I2510 Atherosclerotic heart disease of native coronary artery without angina pectoris: Secondary | ICD-10-CM | POA: Diagnosis not present

## 2013-11-18 DIAGNOSIS — E669 Obesity, unspecified: Secondary | ICD-10-CM | POA: Insufficient documentation

## 2013-11-18 DIAGNOSIS — Z862 Personal history of diseases of the blood and blood-forming organs and certain disorders involving the immune mechanism: Secondary | ICD-10-CM | POA: Insufficient documentation

## 2013-11-18 DIAGNOSIS — I509 Heart failure, unspecified: Secondary | ICD-10-CM | POA: Diagnosis not present

## 2013-11-18 DIAGNOSIS — K219 Gastro-esophageal reflux disease without esophagitis: Secondary | ICD-10-CM | POA: Diagnosis not present

## 2013-11-18 DIAGNOSIS — M1990 Unspecified osteoarthritis, unspecified site: Secondary | ICD-10-CM | POA: Diagnosis not present

## 2013-11-18 DIAGNOSIS — Z95810 Presence of automatic (implantable) cardiac defibrillator: Secondary | ICD-10-CM | POA: Diagnosis not present

## 2013-11-18 DIAGNOSIS — R05 Cough: Secondary | ICD-10-CM | POA: Insufficient documentation

## 2013-11-18 DIAGNOSIS — J45909 Unspecified asthma, uncomplicated: Secondary | ICD-10-CM | POA: Insufficient documentation

## 2013-11-18 DIAGNOSIS — Z79899 Other long term (current) drug therapy: Secondary | ICD-10-CM | POA: Diagnosis not present

## 2013-11-18 DIAGNOSIS — M546 Pain in thoracic spine: Secondary | ICD-10-CM

## 2013-11-18 DIAGNOSIS — Z87891 Personal history of nicotine dependence: Secondary | ICD-10-CM | POA: Diagnosis not present

## 2013-11-18 DIAGNOSIS — I10 Essential (primary) hypertension: Secondary | ICD-10-CM | POA: Diagnosis not present

## 2013-11-18 DIAGNOSIS — F419 Anxiety disorder, unspecified: Secondary | ICD-10-CM | POA: Diagnosis not present

## 2013-11-18 DIAGNOSIS — X58XXXA Exposure to other specified factors, initial encounter: Secondary | ICD-10-CM | POA: Insufficient documentation

## 2013-11-18 DIAGNOSIS — Z7982 Long term (current) use of aspirin: Secondary | ICD-10-CM | POA: Diagnosis not present

## 2013-11-18 DIAGNOSIS — Z9889 Other specified postprocedural states: Secondary | ICD-10-CM | POA: Diagnosis not present

## 2013-11-18 DIAGNOSIS — Y9289 Other specified places as the place of occurrence of the external cause: Secondary | ICD-10-CM | POA: Insufficient documentation

## 2013-11-18 DIAGNOSIS — Y93E3 Activity, vacuuming: Secondary | ICD-10-CM | POA: Insufficient documentation

## 2013-11-18 DIAGNOSIS — Z792 Long term (current) use of antibiotics: Secondary | ICD-10-CM | POA: Insufficient documentation

## 2013-11-18 DIAGNOSIS — S29012A Strain of muscle and tendon of back wall of thorax, initial encounter: Secondary | ICD-10-CM | POA: Insufficient documentation

## 2013-11-18 DIAGNOSIS — Z7951 Long term (current) use of inhaled steroids: Secondary | ICD-10-CM | POA: Diagnosis not present

## 2013-11-18 DIAGNOSIS — R059 Cough, unspecified: Secondary | ICD-10-CM

## 2013-11-18 DIAGNOSIS — Z86010 Personal history of colonic polyps: Secondary | ICD-10-CM | POA: Insufficient documentation

## 2013-11-18 LAB — BASIC METABOLIC PANEL
Anion gap: 14 (ref 5–15)
BUN: 25 mg/dL — ABNORMAL HIGH (ref 6–23)
CO2: 28 mEq/L (ref 19–32)
Calcium: 9.6 mg/dL (ref 8.4–10.5)
Chloride: 100 mEq/L (ref 96–112)
Creatinine, Ser: 1.08 mg/dL (ref 0.50–1.10)
GFR calc Af Amer: 59 mL/min — ABNORMAL LOW (ref >90)
GFR calc non Af Amer: 51 mL/min — ABNORMAL LOW (ref >90)
Glucose, Bld: 118 mg/dL — ABNORMAL HIGH (ref 70–99)
Potassium: 3.8 mEq/L (ref 3.7–5.3)
Sodium: 142 mEq/L (ref 137–147)

## 2013-11-18 LAB — CBC
HCT: 45.5 % (ref 36.0–46.0)
Hemoglobin: 14.9 g/dL (ref 12.0–15.0)
MCH: 29 pg (ref 26.0–34.0)
MCHC: 32.7 g/dL (ref 30.0–36.0)
MCV: 88.5 fL (ref 78.0–100.0)
Platelets: 170 10*3/uL (ref 150–400)
RBC: 5.14 MIL/uL — ABNORMAL HIGH (ref 3.87–5.11)
RDW: 13.6 % (ref 11.5–15.5)
WBC: 7.4 10*3/uL (ref 4.0–10.5)

## 2013-11-18 LAB — TROPONIN I: Troponin I: <0.3 ng/mL (ref <0.30)

## 2013-11-18 NOTE — ED Provider Notes (Addendum)
CSN: [id number]     Arrival date & time 11/18/13  [id number] History   First MD Initiated Contact with Patient 11/18/13 1036     Chief Complaint  Patient presents with  . Cough  . Back Pain     HPI  Patient presents with 2 complaints. One is that she's had a cough last several days. Dry nonproductive. Does a lot of nasal congestion and runny nose. No sinus pain or pressure. No fever. No hemoptysis. She states that yesterday she was vacuuming and he reached down to pick up the vacuum hose and felt a pain and spasm in her back. When she coughs she feels pain spasm as well. No GI symptoms. No pain to extremities. No anterior chest pain or cardiac symptoms.  Past Medical History  Diagnosis Date  . Asthma   . Hypertension   . Arthritis   . GERD (gastroesophageal reflux disease)   . Diverticulosis   . Anemia   . CAD (coronary artery disease)   . Fatty liver disease, nonalcoholic   . Internal hemorrhoids   . Gastritis   . Hyperplastic colon polyp   . Sleep apnea   . ICD (implantable cardiac defibrillator) in place   . Shortness of breath   . IBS (irritable bowel syndrome)   . Anxiety   . CHF (congestive heart failure)   . H/O hiatal hernia   . Obesity   . Back pain    Past Surgical History  Procedure Laterality Date  . Pacemaker insertion  2001    BS BivICD  . Cardiac defibrillator placement    . Appendectomy    . Knee surgery      right  . Ankle surgery      right  . Tubal ligation    . Cervical spine surgery      plate   . Coronary angiogram  2010  . Abdominal ultrasound  12/01/2011    Peripelvic cysts- #1- 2.4x1.9x2.3cm, #2-1.2x0.9x1.2cm  . Cardiac catheterization  05/17/1999    No significant coronary obstructive disease w/ mild 20% luminal irregularity of the first diag branch of the LAD  . Cardiac catheterization  07/08/2002    No significant CAD, moderately depressed LV systolic function  . Cardiac catheterization Bilateral 04/26/2007    Normal findings, recommend  medical therapy  . Cardiac catheterization  02/18/2008    Moderate CAD, would benefit from having a functional study, recommend continue medical therapy  . Cardiac catheterization  07/23/2012    Medical therapy  . Lexiscan myoview  11/14/2011    Mild-moderate defect seen in Mid Inferolateral and Mid Anterolateral regions-consistant w/ infarct/scar. No significant ischemia demonstrated.  . Transthoracic echocardiogram  07/23/2012    EF 55-60%, normal-mild   Family History  Problem Relation Age of Onset  . Breast cancer Mother   . Diabetes Mother   . Heart disease Maternal Grandmother   . Kidney disease Maternal Grandmother   . Diabetes Maternal Grandmother   . Glaucoma Maternal Aunt   . Heart disease Maternal Aunt   . Asthma Sister    History  Substance Use Topics  . Smoking status: Former Smoker -- 0.25 packs/day for 3 years    Types: Cigarettes    Quit date: 02/11/1968  . Smokeless tobacco: Never Used  . Alcohol Use: No   OB History   Grav Para Term Preterm Abortions TAB SAB Ect Mult Living                 Review of  Systems  Constitutional: Negative for fever, chills, diaphoresis, appetite change and fatigue.  HENT: Negative for mouth sores, sore throat and trouble swallowing.   Eyes: Negative for visual disturbance.  Respiratory: Negative for cough, chest tightness, shortness of breath and wheezing.   Cardiovascular: Negative for chest pain.  Gastrointestinal: Negative for nausea, vomiting, abdominal pain, diarrhea and abdominal distention.  Endocrine: Negative for polydipsia, polyphagia and polyuria.  Genitourinary: Negative for dysuria, frequency and hematuria.  Musculoskeletal: Positive for back pain. Negative for gait problem.       Muscular back pain as "spasms" with cough.  Skin: Negative for color change, pallor and rash.  Neurological: Negative for dizziness, syncope, light-headedness and headaches.  Hematological: Does not bruise/bleed easily.    Psychiatric/Behavioral: Negative for behavioral problems and confusion.      Allergies  Ivp dye; Shellfish-derived products; Sulfa antibiotics; and Iodine  Home Medications   Prior to Admission medications   Medication Sig Start Date End Date Taking? Authorizing Provider  albuterol (PROVENTIL HFA;VENTOLIN HFA) 108 (90 BASE) MCG/ACT inhaler Inhale 2 puffs into the lungs every 4 (four) hours as needed for wheezing.     Historical Provider, MD  amLODipine (NORVASC) 10 MG tablet Take 10 mg by mouth daily.    Historical Provider, MD  amoxicillin (AMOXIL) 500 MG capsule Take 1 capsule (500 mg total) by mouth 3 (three) times daily. 10/25/13   Billy Fischer, MD  aspirin 81 MG chewable tablet Chew 81 mg by mouth daily.    Historical Provider, MD  bisoprolol (ZEBETA) 5 MG tablet Take 1 tablet (5 mg total) by mouth daily. 10/10/13   Tanda Rockers, MD  budesonide-formoterol [facility]) 80-4.5 MCG/ACT inhaler Take 2 puffs first thing in am and then another 2 puffs about 12 hours later. 10/10/13   Tanda Rockers, MD  cetirizine (ZYRTEC) 10 MG tablet Take 10 mg by mouth daily.     Historical Provider, MD  clotrimazole (MYCELEX) 10 MG troche One every 4 hours as needed for mouth pain 09/08/13   Tanda Rockers, MD  fluticasone [facility]) 50 MCG/ACT nasal spray Place 1 spray into the nose 2 (two) times daily as needed for rhinitis or allergies.     Historical Provider, MD  furosemide (LASIX) 40 MG tablet Take 1 tablet (40 mg total) by mouth 2 (two) times daily. 11/04/13   [name], MD  isosorbide mononitrate (IMDUR) 30 MG 24 hr tablet Take 1 tablet (30 mg total) by mouth daily. 09/01/13   Liliane Shi, PA-C  losartan (COZAAR) 100 MG tablet Take 1 tablet (100 mg total) by mouth daily. 08/15/13   [name], MD  metolazone (ZAROXOLYN) 2.5 MG tablet Take one tablet x 3 days then as directed. 11/04/13   [name], MD  minocycline (MINOCIN,DYNACIN) 100 MG capsule Take 1 capsule (100 mg total) by mouth 2  (two) times daily. 10/22/13   Billy Fischer, MD  nitroGLYCERIN (NITROSTAT) 0.4 MG SL tablet Place 0.4 mg under the tongue every 5 (five) minutes as needed for chest pain.    Historical Provider, MD  omeprazole (PRILOSEC) 40 MG capsule Take 1 capsule by mouth daily. 07/19/13   Historical Provider, MD  potassium chloride SA (K-DUR,KLOR-CON) 20 MEQ tablet Take 1 tablet (20 mEq total) by mouth daily. 11/10/13   [name], MD  pravastatin (PRAVACHOL) 80 MG tablet Take 80 mg by mouth daily.     Historical Provider, MD  spironolactone (ALDACTONE) 25 MG tablet TAKE ONE-HALF TABLET BY MOUTH TWICE  DAILY 09/09/13   Lorretta Harp, MD   BP 141/62  Pulse 74  Temp(Src) 98 F (36.7 C) (Oral)  Resp 20  SpO2 94% Physical Exam  Constitutional: She is oriented to person, place, and time. She appears well-developed and well-nourished. No distress.  HENT:  Head: Normocephalic.  Eyes: Conjunctivae are normal. Pupils are equal, round, and reactive to light. No scleral icterus.  Neck: Normal range of motion. Neck supple. No thyromegaly present.  Cardiovascular: Normal rate and regular rhythm.  Exam reveals no gallop and no friction rub.   No murmur heard. Pulmonary/Chest: Effort normal and breath sounds normal. No respiratory distress. She has no wheezes. She has no rales.  Clear bilateral breath sounds.  Abdominal: Soft. Bowel sounds are normal. She exhibits no distension. There is no tenderness. There is no rebound.  Musculoskeletal: Normal range of motion.       Back:  Neurological: She is alert and oriented to person, place, and time.  Skin: Skin is warm and dry. No rash noted.  Psychiatric: She has a normal mood and affect. Her behavior is normal.    ED Course  Procedures (including critical care time) Labs Review Labs Reviewed  BASIC METABOLIC PANEL - Abnormal; Notable for the following:    Glucose, Bld 118 (*)    BUN 25 (*)    GFR calc non Af Amer 51 (*)    GFR calc Af Amer 59 (*)    All  other components within normal limits  CBC - Abnormal; Notable for the following:    RBC 5.14 (*)    All other components within normal limits  TROPONIN I    Imaging Review Dg Chest 2 View (if Patient Has Fever And/or Copd)  11/18/2013   CLINICAL DATA:  Chest and back pain.  Shortness breath.  Cough.  EXAM: CHEST  2 VIEW  COMPARISON:  08/18/2013  FINDINGS: Mild cardiomegaly stable. AICD remains in stable position. Mild scarring and left lower lung is unchanged. No evidence of acute infiltrate or edema. No evidence of pleural effusion. Ectasia of the thoracic aorta is stable.  IMPRESSION: Stable mild cardiomegaly.  No active lung disease.   Electronically Signed   By: Earle Gell M.D.   On: 11/18/2013 10:32     EKG Interpretation   Date/Time:  Friday November 18 2013 11:13:55 EDT Ventricular Rate:  65 PR Interval:  99 QRS Duration: 154 QT Interval:  498 QTC Calculation: 518 R Axis:   -90 Text Interpretation:  Atrial-ventricular dual-paced rhythm No further  analysis attempted due to paced rhythm Confirmed by Jeneen Rinks  MD, [place]  [phone]) on 11/18/2013 11:34:37 AM      MDM   Final diagnoses:  Cough  Left-sided thoracic back pain  Strain of thoracic paraspinal muscles excluding T1 and T2 levels, initial encounter    Is reproducible and seems quite musculoskeletal. Clear lungs. Unchanged EKG with paced rhythm. Reassuring chest x-ray without infiltrate or effusions. No leukocytosis. Plan is home. Cough suppressant. Muscle relaxant.    Tanna Furry, MD 11/18/13 [place], MD 11/18/13 [phone]

## 2013-11-18 NOTE — Discharge Instructions (Signed)
Cough, Adult  A cough is a reflex that helps clear your throat and airways. It can help heal the body or may be a reaction to an irritated airway. A cough may only last 2 or 3 weeks (acute) or may last more than 8 weeks (chronic).  CAUSES Acute cough:  Viral or bacterial infections. Chronic cough:  Infections.  Allergies.  Asthma.  Post-nasal drip.  Smoking.  Heartburn or acid reflux.  Some medicines.  Chronic lung problems (COPD).  Cancer. SYMPTOMS   Cough.  Fever.  Chest pain.  Increased breathing rate.  High-pitched whistling sound when breathing (wheezing).  Colored mucus that you cough up (sputum). TREATMENT   A bacterial cough may be treated with antibiotic medicine.  A viral cough must run its course and will not respond to antibiotics.  Your caregiver may recommend other treatments if you have a chronic cough. HOME CARE INSTRUCTIONS   Only take over-the-counter or prescription medicines for pain, discomfort, or fever as directed by your caregiver. Use cough suppressants only as directed by your caregiver.  Use a cold steam vaporizer or humidifier in your bedroom or home to help loosen secretions.  Sleep in a semi-upright position if your cough is worse at night.  Rest as needed.  Stop smoking if you smoke. SEEK IMMEDIATE MEDICAL CARE IF:   You have pus in your sputum.  Your cough starts to worsen.  You cannot control your cough with suppressants and are losing sleep.  You begin coughing up blood.  You have difficulty breathing.  You develop pain which is getting worse or is uncontrolled with medicine.  You have a fever. MAKE SURE YOU:   Understand these instructions.  Will watch your condition.  Will get help right away if you are not doing well or get worse. Document Released: 07/26/2010 Document Revised: 04/21/2011 Document Reviewed: 07/26/2010 [facility] Patient Information 2015 [place], [place]. This information is not intended  to replace advice given to you by your health care provider. Make sure you discuss any questions you have with your health care provider.  Thoracic Strain Thoracic strain is an injury to the muscles of the upper back. A mild strain may take only 1 week to heal. Torn muscles or tendons may take 6 weeks to 2 months to heal. HOME CARE  Put ice on the injured area.  Put ice in a plastic bag.  Place a towel between your skin and the bag.  Leave the ice on for 15-20 minutes, 03-04 times a day, for the first 2 days.  Only take medicine as told by your doctor.  Go to physical therapy and perform exercises as told by your doctor.  Use wraps and back braces as told by your doctor.  Warm up before being active. GET HELP RIGHT AWAY IF:   There is more bruising, puffiness (swelling), or pain.  Medicine does not help the pain.  You have trouble breathing, chest pain, or a fever.  Your problems seem to be getting worse, not better. MAKE SURE YOU:   Understand these instructions.  Will watch your condition.  Will get help right away if you are not doing well or get worse. Document Released: 07/16/2007 Document Revised: 04/21/2011 Document Reviewed: 03/18/2010 [facility] Patient Information 2015 [place], [place]. This information is not intended to replace advice given to you by your health care provider. Make sure you discuss any questions you have with your health care provider.

## 2013-11-18 NOTE — ED Notes (Signed)
Bed: WA02 Expected date:  Expected time:  Means of arrival:  Comments: 

## 2013-11-18 NOTE — ED Notes (Signed)
Pt from home c/o productive cough with green sputum accompanied by back pain x3 days. Pt has hx of asthma and has been using rx'd meds and neb. Pt denies SOB or CP, N/V/D. Pt c/o not being able to sleep from coughing. Pt is A&O and in NAD

## 2013-11-20 IMAGING — CR DG CHEST 2V
2 series · 2 of 2 positions shown · non-contrast
Comparison: July 22, 2012

CLINICAL DATA: Shortness of Breath

EXAM:
CHEST  2 VIEW

[w chest pa]
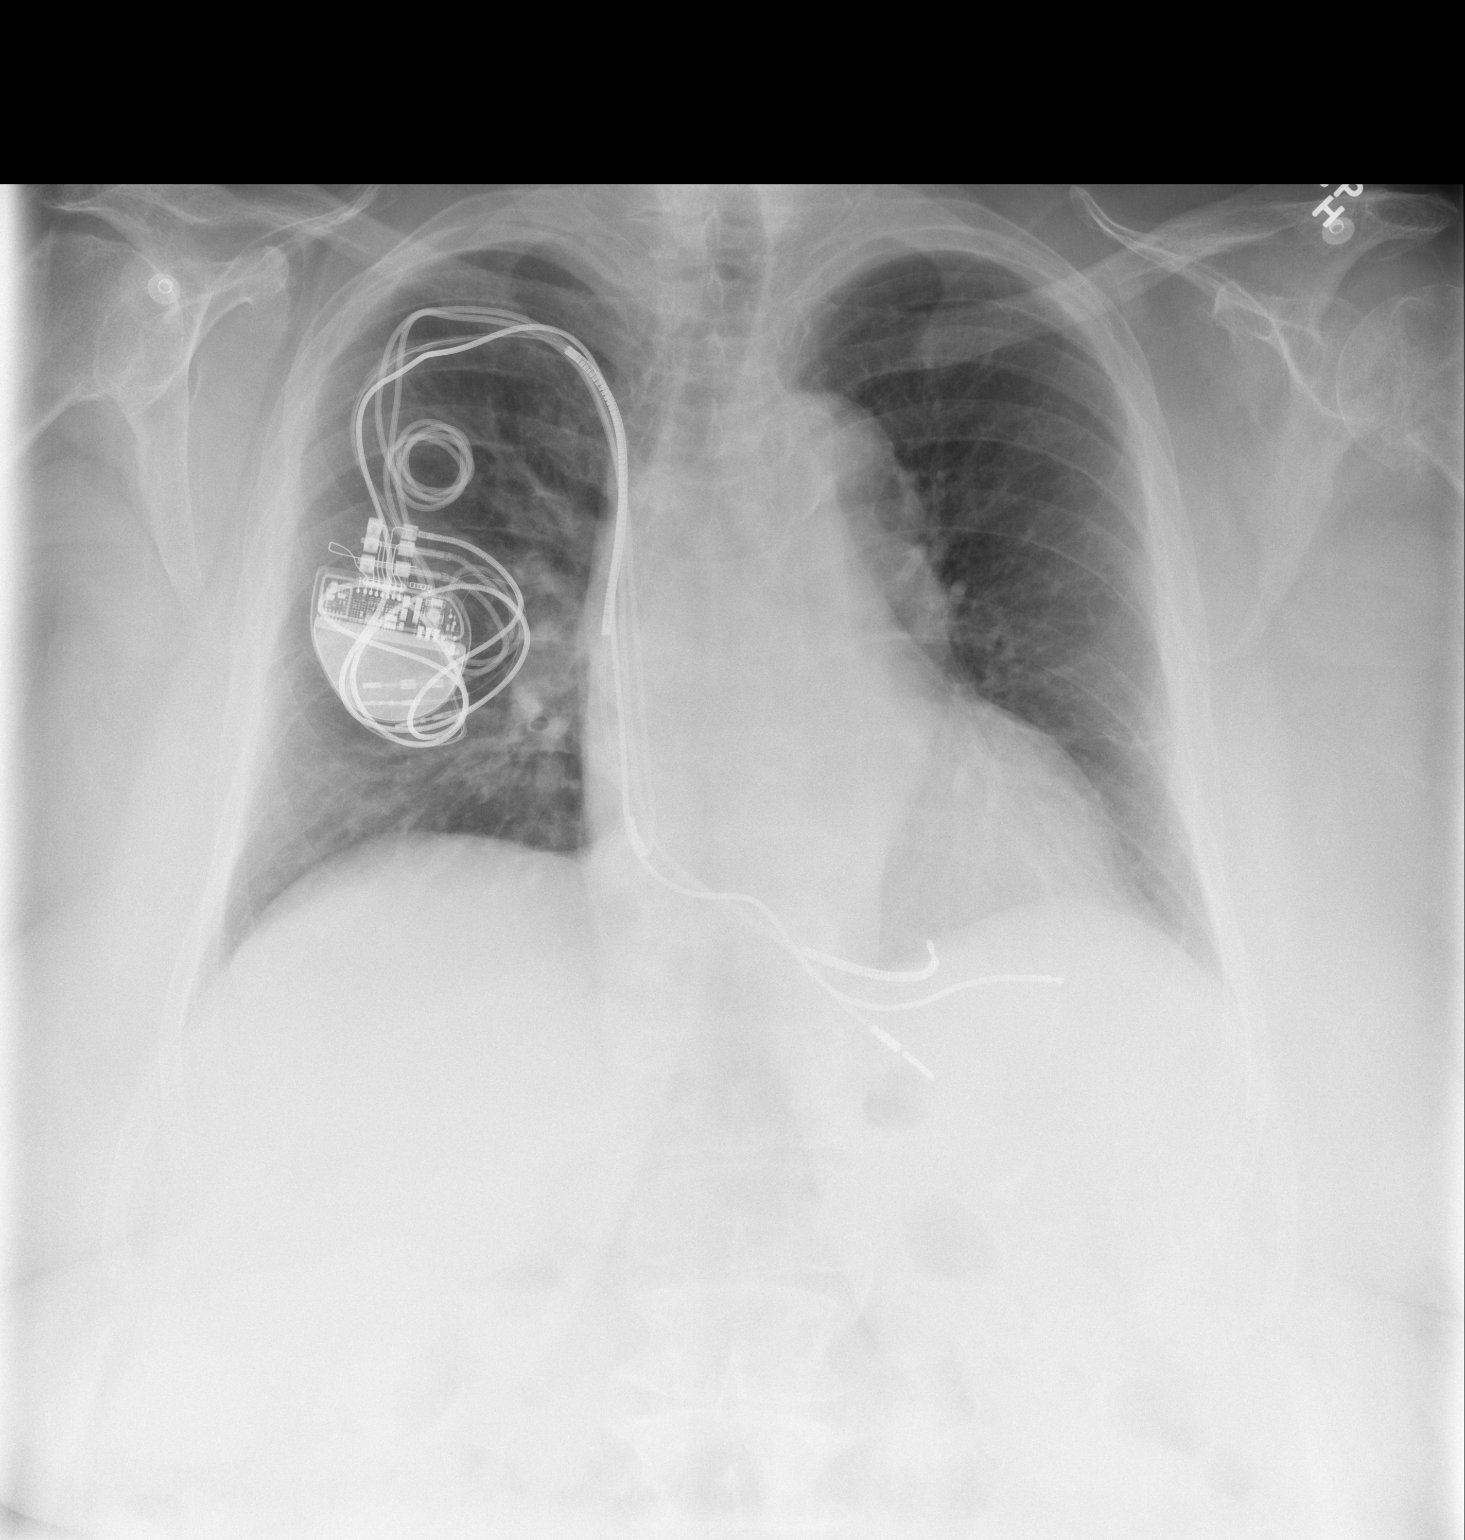

[w chest lat]
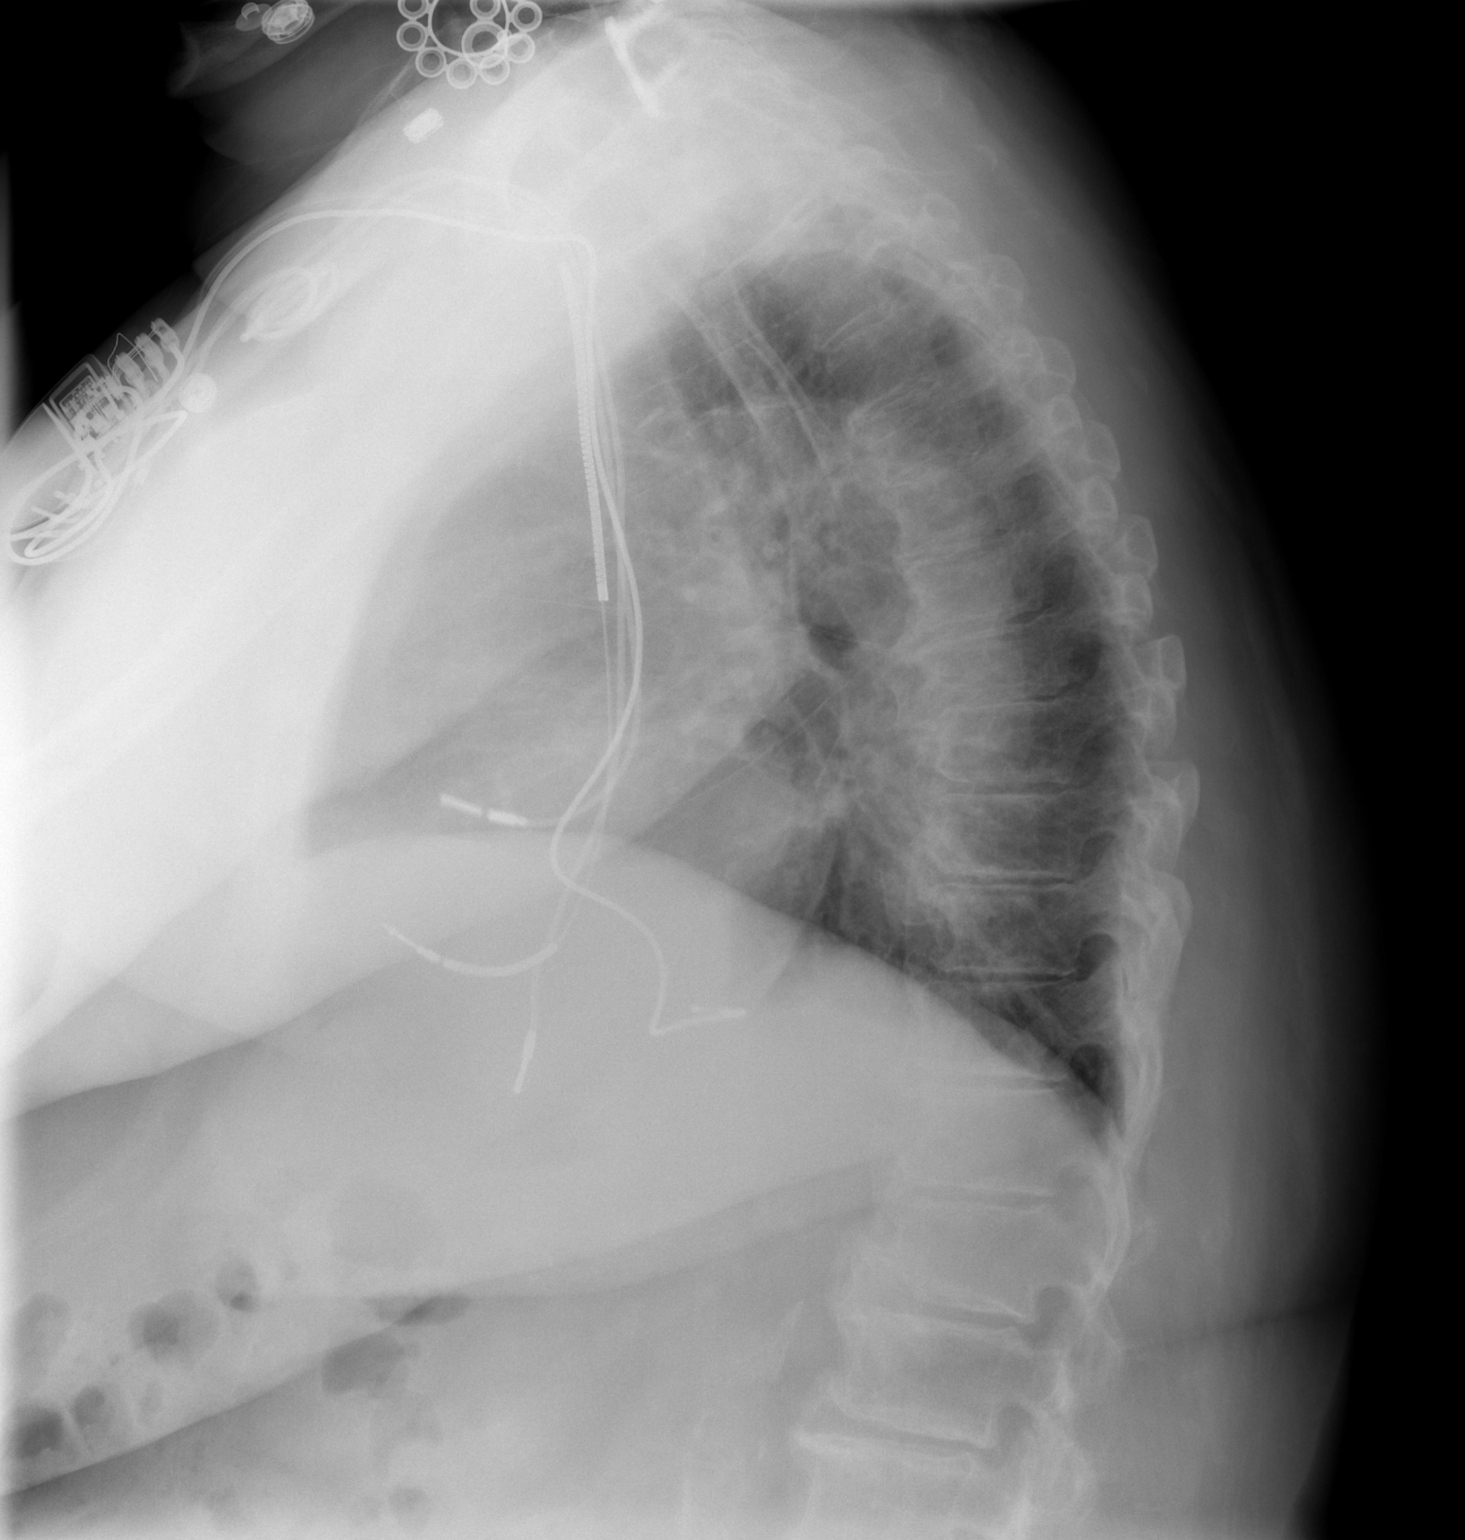

[2 of 2 positions shown; findings below may reference images not displayed]

FINDINGS: Lungs are clear. Heart size and pulmonary vascularity are normal. No
adenopathy. Pacemaker leads are attached to the the right atrium and
right ventricle. There is also a lead attached to the left
ventricle. There is degenerative change in the thoracic spine. No
adenopathy. There is postoperative change in the lower cervical
spine.
IMPRESSION: No edema or consolidation.

## 2013-11-22 ENCOUNTER — Ambulatory Visit (INDEPENDENT_AMBULATORY_CARE_PROVIDER_SITE_OTHER): Payer: Medicare Other | Admitting: Pulmonary Disease

## 2013-11-22 ENCOUNTER — Encounter: Payer: Self-pay | Admitting: Pulmonary Disease

## 2013-11-22 VITALS — BP 116/72 | HR 79 | Temp 98.1°F | Ht 62.0 in | Wt 259.8 lb

## 2013-11-22 DIAGNOSIS — G4733 Obstructive sleep apnea (adult) (pediatric): Secondary | ICD-10-CM

## 2013-11-22 NOTE — Progress Notes (Signed)
Subjective:    Patient ID: [name], female    DOB: 08/06/1945, 68 y.o.   MRN: [id number]  HPI The patient is a 68 year old female who I've been asked to see for management of obstructive sleep apnea. She underwent a sleep study in 2000 and may, where she was found to have very severe OSA.  She was found to have an AHI of 59 per hour, with desaturation as low as 58%. They tried to start her on CPAP for treatment, but she could not tolerate this, panicked, and left the sleep Center. She has not been treated since 2008, but has gained over 50 pounds since her last sleep study. She continues to have loud snoring as well as an abnormal breathing pattern during sleep. She has frequent awakenings at night, and is not rested in the mornings upon arising. She is very tired and sleepy during the day, and has an Epworth score of 12.   What time do you typically go to bed?( Between what hours) 11pm-12AM 11pm-12AM at 1516 on 11/22/13 by Inge Rise, CMA How long does it take you to fall asleep? 30 min 30 min at 1516 on 11/22/13 by Inge Rise, CMA How many times during the night do you wake up? 2 2 at 1516 on 11/22/13 by Inge Rise, CMA What time do you get out of bed to start your day? 0630 0630 at 1516 on 11/22/13 by Inge Rise, CMA Do you drive or operate heavy machinery in your occupation? No No at 1516 on 11/22/13 by Inge Rise, CMA How much has your weight changed (up or down) over the past two years? (In pounds) 20 lb (9.072 kg) 20 lb (9.072 kg) at 1516 on 11/22/13 by Inge Rise, CMA Have you ever had a sleep study before? Yes Yes at 1516 on 11/22/13 by Inge Rise, CMA If yes, location of study? [facility] at 1516 on 11/22/13 by Inge Rise, CMA If yes, date of study? 2008 2008 at 1516 on 11/22/13 by Inge Rise, CMA Do you currently use CPAP? No No at 1516 on 11/22/13 by Inge Rise, CMA Do you wear oxygen at any time? No    Review of Systems    Constitutional: Negative for fever and unexpected weight change.  HENT: Negative for congestion, dental problem, ear pain, nosebleeds, postnasal drip, rhinorrhea, sinus pressure, sneezing, sore throat and trouble swallowing.   Eyes: Negative for redness and itching.  Respiratory: Negative for cough, chest tightness, shortness of breath and wheezing.   Cardiovascular: Negative for palpitations and leg swelling.  Gastrointestinal: Negative for nausea and vomiting.  Genitourinary: Negative for dysuria.  Musculoskeletal: Negative for joint swelling.  Skin: Negative for rash.  Neurological: Negative for headaches.  Hematological: Does not bruise/bleed easily.  Psychiatric/Behavioral: Negative for dysphoric mood. The patient is not nervous/anxious.        Objective:   Physical Exam Constitutional:  Morbidly obese female, no acute distress  HENT:  Nares patent without discharge  Oropharynx without exudate, palate and uvula are thick and elongated, small op with narrowing posteriorly  Eyes:  Perrla, eomi, no scleral icterus  Neck:  No JVD, no TMG  Cardiovascular:  Normal rate, regular rhythm, no rubs or gallops.  No murmurs        Intact distal pulses but decreased.  Pulmonary :  Normal breath sounds, no stridor or respiratory distress   No rhonchi, or wheezing.  +basilar crackles.  Abdominal:  Soft, nondistended, bowel sounds present.  No tenderness noted.   Musculoskeletal:  1-2+ lower extremity edema noted.  Lymph Nodes:  No cervical lymphadenopathy noted  Skin:  No cyanosis noted  Neurologic:  Alert, appropriate, moves all 4 extremities without obvious deficit.         Assessment & Plan:

## 2013-11-22 NOTE — Assessment & Plan Note (Signed)
The patient has a history of very severe obstructive sleep apnea by a sleep study in 2008, and has gained over 50 pounds since that study. She feels that her sleep symptoms haven't worsened, and she is willing to try some type of a positive pressure device at home. She may require a full face mask because of mouth opening, and I have discussed with her the process of desensitization during the day while at home. I think that we should start her on bilevel for comfort, since she clearly failed CPAP trial during her split night study. I have also encouraged her to work aggressively on weight loss.

## 2013-11-22 NOTE — Patient Instructions (Signed)
Will start on bilevel machine for your very severe sleep apnea.  Please call me if you are having tolerance issues. Try wearing for 30-60 min twice a day while watching tv or reading in your family room to help you with the adjustment. Work on weight loss followup with me again in 8 weeks.

## 2013-11-23 ENCOUNTER — Encounter: Payer: Self-pay | Admitting: Cardiovascular Disease

## 2013-11-23 ENCOUNTER — Ambulatory Visit (INDEPENDENT_AMBULATORY_CARE_PROVIDER_SITE_OTHER): Payer: Medicare Other | Admitting: Cardiovascular Disease

## 2013-11-23 VITALS — BP 150/90 | HR 75 | Resp 20 | Ht 62.0 in | Wt 255.0 lb

## 2013-11-23 DIAGNOSIS — I2510 Atherosclerotic heart disease of native coronary artery without angina pectoris: Secondary | ICD-10-CM

## 2013-11-23 DIAGNOSIS — I429 Cardiomyopathy, unspecified: Secondary | ICD-10-CM

## 2013-11-23 DIAGNOSIS — G4733 Obstructive sleep apnea (adult) (pediatric): Secondary | ICD-10-CM

## 2013-11-23 DIAGNOSIS — I5033 Acute on chronic diastolic (congestive) heart failure: Secondary | ICD-10-CM

## 2013-11-23 DIAGNOSIS — I428 Other cardiomyopathies: Secondary | ICD-10-CM

## 2013-11-23 NOTE — Patient Instructions (Signed)
Your physician recommends that you weigh, daily, at the same time every day, and in the same amount of clothing. Please record your daily weights on the handout provided and bring it to your next appointment.   Your physician recommends that you schedule a follow-up appointment in: 3 weeks with an extender.  Dr. [name] recommends that you schedule a follow-up appointment in: 2 months.  Low-Sodium Eating Plan Sodium raises blood pressure and causes water to be held in the body. Getting less sodium from food will help lower your blood pressure, reduce any swelling, and protect your heart, liver, and kidneys. We get sodium by adding salt (sodium chloride) to food. Most of our sodium comes from canned, boxed, and frozen foods. Restaurant foods, fast foods, and pizza are also very high in sodium. Even if you take medicine to lower your blood pressure or to reduce fluid in your body, getting less sodium from your food is important. WHAT IS MY PLAN? Most people should limit their sodium intake to 2,300 mg a day. Your health care provider recommends that you limit your sodium intake to ___1800_______ a day.  WHAT DO I NEED TO KNOW ABOUT THIS EATING PLAN? For the low-sodium eating plan, you will follow these general guidelines:  Choose foods with a % Daily Value for sodium of less than 5% (as listed on the food label).   Use salt-free seasonings or herbs instead of table salt or sea salt.   Check with your health care provider or pharmacist before using salt substitutes.   Eat fresh foods.  Eat more vegetables and fruits.  Limit canned vegetables. If you do use them, rinse them well to decrease the sodium.   Limit cheese to 1 oz (28 g) per day.   Eat lower-sodium products, often labeled as "lower sodium" or "no salt added."  Avoid foods that contain monosodium glutamate (MSG). MSG is sometimes added to [place] food and some canned foods.  Check food labels (Nutrition Facts labels) on  foods to learn how much sodium is in one serving.  Eat more home-cooked food and less restaurant, buffet, and fast food.  When eating at a restaurant, ask that your food be prepared with less salt or none, if possible.  HOW DO I READ FOOD LABELS FOR SODIUM INFORMATION? The Nutrition Facts label lists the amount of sodium in one serving of the food. If you eat more than one serving, you must multiply the listed amount of sodium by the number of servings. Food labels may also identify foods as:  Sodium free--Less than 5 mg in a serving.  Very low sodium--35 mg or less in a serving.  Low sodium--140 mg or less in a serving.  Light in sodium--50% less sodium in a serving. For example, if a food that usually has 300 mg of sodium is changed to become light in sodium, it will have 150 mg of sodium.  Reduced sodium--25% less sodium in a serving. For example, if a food that usually has 400 mg of sodium is changed to reduced sodium, it will have 300 mg of sodium. WHAT FOODS CAN I EAT? Grains Low-sodium cereals, including oats, puffed wheat and rice, and shredded wheat cereals. Low-sodium crackers. Unsalted rice and pasta. Lower-sodium bread.  Vegetables Frozen or fresh vegetables. Low-sodium or reduced-sodium canned vegetables. Low-sodium or reduced-sodium tomato sauce and paste. Low-sodium or reduced-sodium tomato and vegetable juices.  Fruits Fresh, frozen, and canned fruit. Fruit juice.  Meat and Other Protein Products Low-sodium canned tuna  and salmon. Fresh or frozen meat, poultry, seafood, and fish. Lamb. Unsalted nuts. Dried beans, peas, and lentils without added salt. Unsalted canned beans. Homemade soups without salt. Eggs.  Dairy Milk. Soy milk. Ricotta cheese. Low-sodium or reduced-sodium cheeses. Yogurt.  Condiments Fresh and dried herbs and spices. Salt-free seasonings. Onion and garlic powders. Low-sodium varieties of mustard and ketchup. Lemon juice.  Fats and  Oils Reduced-sodium salad dressings. Unsalted butter.  Other Unsalted popcorn and pretzels.  The items listed above may not be a complete list of recommended foods or beverages. Contact your dietitian for more options. WHAT FOODS ARE NOT RECOMMENDED? Grains Instant hot cereals. Bread stuffing, pancake, and biscuit mixes. Croutons. Seasoned rice or pasta mixes. Noodle soup cups. Boxed or frozen macaroni and cheese. Self-rising flour. Regular salted crackers. Vegetables Regular canned vegetables. Regular canned tomato sauce and paste. Regular tomato and vegetable juices. Frozen vegetables in sauces. Salted french fries. Olives. Angie Fava. Relishes. Sauerkraut. Salsa. Meat and Other Protein Products Salted, canned, smoked, spiced, or pickled meats, seafood, or fish. Bacon, ham, sausage, hot dogs, corned beef, chipped beef, and packaged luncheon meats. Salt pork. Jerky. Pickled herring. Anchovies, regular canned tuna, and sardines. Salted nuts. Dairy Processed cheese and cheese spreads. Cheese curds. Blue cheese and cottage cheese. Buttermilk.  Condiments Onion and garlic salt, seasoned salt, table salt, and sea salt. Canned and packaged gravies. Worcestershire sauce. Tartar sauce. Barbecue sauce. Teriyaki sauce. Soy sauce, including reduced sodium. Steak sauce. Fish sauce. Oyster sauce. Cocktail sauce. Horseradish. Regular ketchup and mustard. Meat flavorings and tenderizers. Bouillon cubes. Hot sauce. Tabasco sauce. Marinades. Taco seasonings. Relishes. Fats and Oils Regular salad dressings. Salted butter. Margarine. Ghee. Bacon fat.  Other Potato and tortilla chips. Corn chips and puffs. Salted popcorn and pretzels. Canned or dried soups. Pizza. Frozen entrees and pot pies.  The items listed above may not be a complete list of foods and beverages to avoid. Contact your dietitian for more information. Document Released: 07/19/2001 Document Revised: 02/01/2013 Document Reviewed:  12/01/2012 [facility] Patient Information 2015 [place], [place]. This information is not intended to replace advice given to you by your health care provider. Make sure you discuss any questions you have with your health care provider.

## 2013-11-27 NOTE — Progress Notes (Signed)
Patient ID: [name], female   DOB: Oct 10, 1945, 68 y.o.   MRN: [id number]     Reason for office visit CHF  After losing about 8 pounds of fluid in late September with increased diuretic dose and addition of metolazone, Mrs. Miars has gained it all back and a little more. I think it is because of sodium indiscretion. She is short of breath and has coughing but does not have angina pectoris.  Her echo showed normal left ventricular systolic function and confirmed decompensated diastolic heart failure (1 normal mitral inflow). She has a normally functioning dual-chamber biventricular defibrillator implant in 2008 for severe nonischemic cardiomyopathy (before that she had had a dual-chamber pacemaker implanted in 2001 for second degree AV block). She does not have significant coronary artery disease by previous angiography and perfusion imaging.  She had a low risk nuclear scan in October of 2013.  She initially received a dual-chamber pacemaker in 2001 for second degree AV block. She then developed congestive heart failure. At one point left ventricular ejection fraction was severely depressed and her pacemaker was upgraded to a CRT-D device in 2008. She was a "hyperresponder" to CRT and EF returned to normal. Sequentially, at the time of her generator change out in 2014 her biventricular defibrillator generator was changed out for a biventricular pacemaker [facility] dual-chamber). Device check on September 25 showed excellent biventricular pacing, 100% and no recent episodes of atrial fibrillation or meaningful ventricular arrhythmia.  She has minor coronary atherosclerosis with lesions between 20% and 50% scattered throughout all 3 coronary arteries but does not have a history of myocardial infarction. She is morbidly obese and recently has been told that her glucose is borderline high although she has never been diagnosed with full-blown diabetes mellitus.   She also has  obstructive sleep apnea that was determined to be severe by previous sleep study but panicked when CPAP mask was placed on her face. She saw Dr. Normajean Baxter yesterday. It sounds as she is willing to give it another try using the nasal pillows or a fullface mask.  Allergies  Allergen Reactions  . Ivp Dye [Iodinated Diagnostic Agents] Shortness Of Breath  . Shellfish-Derived Products Anaphylaxis  . Sulfa Antibiotics Shortness Of Breath  . Iodine Hives    Current Outpatient Prescriptions  Medication Sig Dispense Refill  . albuterol (PROVENTIL HFA;VENTOLIN HFA) 108 (90 BASE) MCG/ACT inhaler Inhale 2 puffs into the lungs every 4 (four) hours as needed for wheezing.       [name] amLODipine (NORVASC) 10 MG tablet Take 10 mg by mouth daily.      [name] aspirin 81 MG chewable tablet Chew 81 mg by mouth daily.      [name] BIOTIN PO Take 1 tablet by mouth daily.      . bisoprolol (ZEBETA) 5 MG tablet Take 1 tablet (5 mg total) by mouth daily.  30 tablet  11  . budesonide-formoterol (SYMBICORT) 80-4.5 MCG/ACT inhaler Take 2 puffs first thing in am and then another 2 puffs about 12 hours later.  1 Inhaler  11  . cetirizine (ZYRTEC) 10 MG tablet Take 10 mg by mouth daily.       . clotrimazole (MYCELEX) 10 MG troche One every 4 hours as needed for mouth pain  12 tablet  11  . fluticasone (FLONASE) 50 MCG/ACT nasal spray Place 1 spray into the nose 2 (two) times daily as needed for rhinitis or allergies.       . furosemide (LASIX) 40 MG  tablet Take 1 tablet (40 mg total) by mouth 2 (two) times daily.  60 tablet  5  . isosorbide mononitrate (IMDUR) 30 MG 24 hr tablet Take 1 tablet (30 mg total) by mouth daily.  30 tablet  11  . losartan (COZAAR) 100 MG tablet Take 1 tablet (100 mg total) by mouth daily.  90 tablet  2  . nitroGLYCERIN (NITROSTAT) 0.4 MG SL tablet Place 0.4 mg under the tongue every 5 (five) minutes as needed for chest pain.      [name] omeprazole (PRILOSEC) 40 MG capsule Take 1 capsule by mouth daily.      .  potassium chloride SA (K-DUR,KLOR-CON) 20 MEQ tablet Take 1 tablet (20 mEq total) by mouth daily.  60 tablet  11  . pravastatin (PRAVACHOL) 80 MG tablet Take 80 mg by mouth every evening.       [name] spironolactone (ALDACTONE) 25 MG tablet TAKE ONE-HALF TABLET BY MOUTH TWICE DAILY  30 tablet  11   No current facility-administered medications for this visit.    Past Medical History  Diagnosis Date  . Asthma   . Hypertension   . Arthritis   . GERD (gastroesophageal reflux disease)   . Diverticulosis   . Anemia   . CAD (coronary artery disease)   . Fatty liver disease, nonalcoholic   . Internal hemorrhoids   . Gastritis   . Hyperplastic colon polyp   . Sleep apnea   . ICD (implantable cardiac defibrillator) in place   . Shortness of breath   . IBS (irritable bowel syndrome)   . Anxiety   . CHF (congestive heart failure)   . H/O hiatal hernia   . Obesity   . Back pain     Past Surgical History  Procedure Laterality Date  . Pacemaker insertion  2001    BS BivICD  . Cardiac defibrillator placement    . Appendectomy    . Knee surgery      right  . Ankle surgery      right  . Tubal ligation    . Cervical spine surgery      plate   . Coronary angiogram  2010  . Abdominal ultrasound  12/01/2011    Peripelvic cysts- #1- 2.4x1.9x2.3cm, #2-1.2x0.9x1.2cm  . Cardiac catheterization  05/17/1999    No significant coronary obstructive disease w/ mild 20% luminal irregularity of the first diag branch of the LAD  . Cardiac catheterization  07/08/2002    No significant CAD, moderately depressed LV systolic function  . Cardiac catheterization Bilateral 04/26/2007    Normal findings, recommend medical therapy  . Cardiac catheterization  02/18/2008    Moderate CAD, would benefit from having a functional study, recommend continue medical therapy  . Cardiac catheterization  07/23/2012    Medical therapy  . Lexiscan myoview  11/14/2011    Mild-moderate defect seen in Mid Inferolateral and Mid  Anterolateral regions-consistant w/ infarct/scar. No significant ischemia demonstrated.  . Transthoracic echocardiogram  07/23/2012    EF 55-60%, normal-mild    Family History  Problem Relation Age of Onset  . Breast cancer Mother   . Diabetes Mother   . Heart disease Maternal Grandmother   . Kidney disease Maternal Grandmother   . Diabetes Maternal Grandmother   . Glaucoma Maternal Aunt   . Heart disease Maternal Aunt   . Asthma Sister     History   Social History  . Marital Status: Widowed    Spouse Name: N/A    Number of  Children: 5  . Years of Education: N/A   Occupational History  . 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 Smoking status: Former Smoker -- 0.25 packs/day for 3 years    Types: Cigarettes    Quit date: 02/11/1968  . Smokeless tobacco: Never Used  . Alcohol Use: No  . Drug Use: No  . Sexual Activity: No   Other Topics Concern  . Not on file   Social History Narrative   Widowed last year. She lives with her two sons.    Review of systems: Shortness of breath with minimal exertion, NYHA functional class III, increased weight, lower extremity edema. She also complains of "pinched nerve" type sensation in her left cervical and infraclavicular area.  The patient specifically denies any chest pain at rest or with exertion, dyspnea at rest, orthopnea, paroxysmal nocturnal dyspnea, syncope, palpitations, focal neurological deficits, intermittent claudication, unexplained weight gain, cough, hemoptysis or wheezing.  The patient also denies abdominal pain, nausea, vomiting, dysphagia, diarrhea, constipation, polyuria, polydipsia, dysuria, hematuria, frequency, urgency, abnormal bleeding or bruising, fever, chills, unexpected weight changes, mood swings, change in skin or hair texture, change in voice quality, auditory or visual problems, allergic reactions or rashes, new musculoskeletal complaints other than usual "aches and  pains".   PHYSICAL EXAM BP 150/90  Pulse 75  Resp 20  Ht 5\' 2"  (1.575 m)  Wt 115.667 kg (255 lb)  BMI 46.63 kg/m2  SpO2 98% General: Alert, oriented x3, no distress. Morbidly obese  Head: no evidence of trauma, PERRL, EOMI, no exophtalmos or lid lag, no myxedema, no xanthelasma; normal ears, nose and oropharynx  Neck: normal jugular venous pulsations and no hepatojugular reflux; brisk carotid pulses without delay and no carotid bruits  Chest: clear to auscultation, no signs of consolidation by percussion or palpation, normal fremitus, symmetrical and full respiratory excursions; healthy right subclavian pacemaker site  Cardiovascular: normal position and quality of the apical impulse, regular rhythm, normal first and second heart sounds, no murmurs, rubs or gallops  Abdomen: no tenderness or distention, no masses by palpation, no abnormal pulsatility or arterial bruits, normal bowel sounds, no hepatosplenomegaly  Extremities: no clubbing, cyanosis; 2+ pedal symmetrical edema; 2+ radial, ulnar and brachial pulses bilaterally; 2+ right femoral, posterior tibial and dorsalis pedis pulses; 2+ left femoral, posterior tibial and dorsalis pedis pulses; no subclavian or femoral bruits  Neurological: grossly nonfocal  EKG: Atrial sensed, biventricular paced   Lipid Panel     Component Value Date/Time   CHOL 234* 11/16/2013 0938   TRIG 187* 11/16/2013 0938   HDL 49 11/16/2013 0938   CHOLHDL 4.8 11/16/2013 0938   VLDL 37 11/16/2013 0938   LDLCALC 148* 11/16/2013 0938    BMET    Component Value Date/Time   NA 142 11/18/2013 1049   K 3.8 11/18/2013 1049   CL 100 11/18/2013 1049   CO2 28 11/18/2013 1049   GLUCOSE 118* 11/18/2013 1049   BUN 25* 11/18/2013 1049   CREATININE 1.08 11/18/2013 1049   CREATININE 1.37* 11/16/2013 1026   CALCIUM 9.6 11/18/2013 1049   GFRNONAA 51* 11/18/2013 1049   GFRAA 59* 11/18/2013 1049     ASSESSMENT AND PLAN  Mrs. Capone will restart taking metolazone 2.5 mg daily  30 minutes before she takes her morning dose of furosemide. She should continue doing this as long as her weight exceeds 240 pounds on her home scale. We spent a long time discussing sodium restriction. We'll have to see her  back fairly frequently to make sure our treatment plan is working. May still have to consider a radical left heart catheterization. Strongly encouraged to give obstructive sleep apnea therapy with CPAP another try. Holli Humbles, MD, [place] [phone] office [phone] pager

## 2013-12-02 ENCOUNTER — Telehealth: Payer: Self-pay | Admitting: Internal Medicine

## 2013-12-02 MED ORDER — DOXYCYCLINE HYCLATE 100 MG PO TABS: 100.0000 mg | ORAL_TABLET | Freq: Two times a day (BID) | ORAL | Status: DC

## 2013-12-02 NOTE — Telephone Encounter (Signed)
Pt advised and rx sent. Yakov Bergen, CMA  

## 2013-12-02 NOTE — Telephone Encounter (Signed)
I am not sure what is going on but acute bronchitis perhaps. She can try    - Take doxycycline 100mg  po twice daily x 5 days; take after meals and avoid sunlight. WARNING - nausea .    IF not better call back or go to ER  Dr. Brand Males, M.D., [facility].C.P Pulmonary and Critical Care Medicine Staff Physician [place] [facility] Pager: [phone], If no answer or between  15:00h - 7:00h: call [phone]  12/02/2013 2:47 PM

## 2013-12-02 NOTE — Telephone Encounter (Signed)
Spoke with the pt  She c/o increased cough x 1 wk Coughing up moderate amounts of clear to green sputum  She states that she is also more SOB than usual, but this is due to retaining more fluid, and cardiology is keeping a close eye on this  I advised needs ov, but she has no transportation today  MW off today, so will forward to doc of the day MR- please advise, thanks! Allergies  Allergen Reactions  . Ivp Dye [Iodinated Diagnostic Agents] Shortness Of Breath  . Shellfish-Derived Products Anaphylaxis  . Sulfa Antibiotics Shortness Of Breath  . Iodine Hives

## 2013-12-05 IMAGING — CT CT SHOULDER*L* W/O CM
3 series · 8 of 14 positions shown, 9 images · non-contrast
Comparison: Radiographs dated 09/25/2012

CLINICAL DATA: Left humeral head fracture. Left shoulder pain and
decreased range of motion.

EXAM:
CT OF THE LEFT SHOULDER WITHOUT CONTRAST
TECHNIQUE: Multidetector CT imaging was performed according to the standard
protocol. Multiplanar CT image reconstructions were also generated.

[Series 3: shoulder plus/standard · axial · 0.49mm/px · z∈[-159,-94]mm · 2 of 79 slices shown, 3 images]
[im 27/79  soft-tissue]
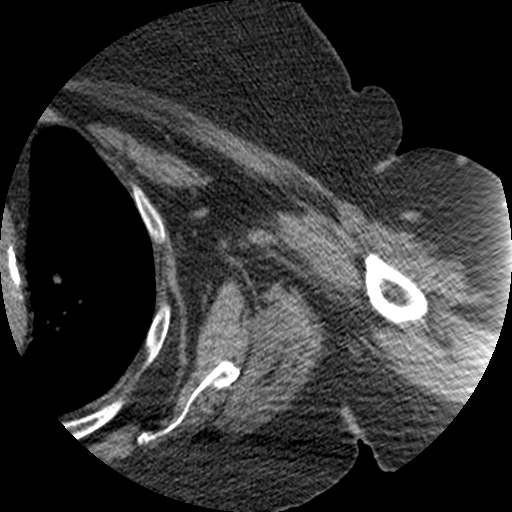
[im 27/79  bone]
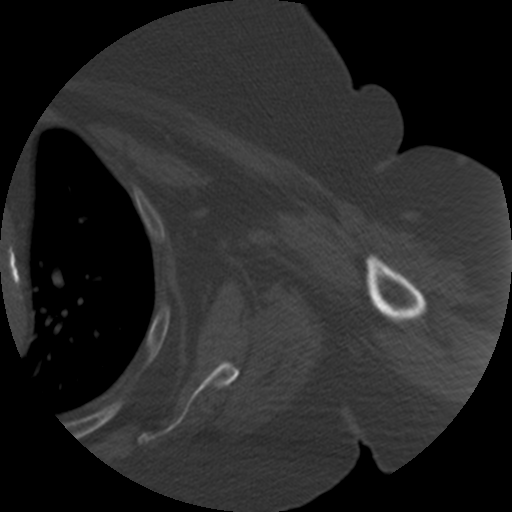
[im 53/79  bone]
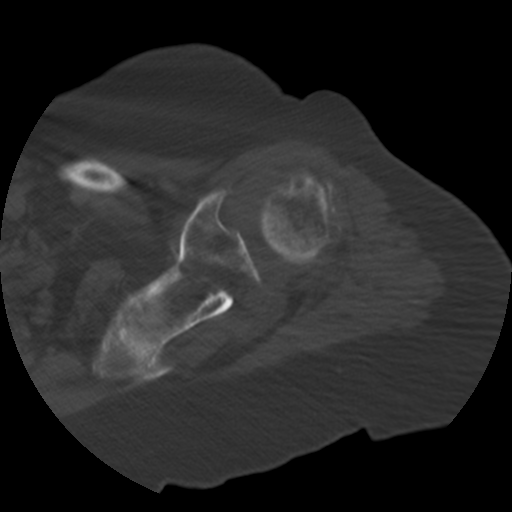

[Series 103: sag soft · oblique · 0.49mm/px · 3 of 87 slices shown]
[im 22/87  soft-tissue]
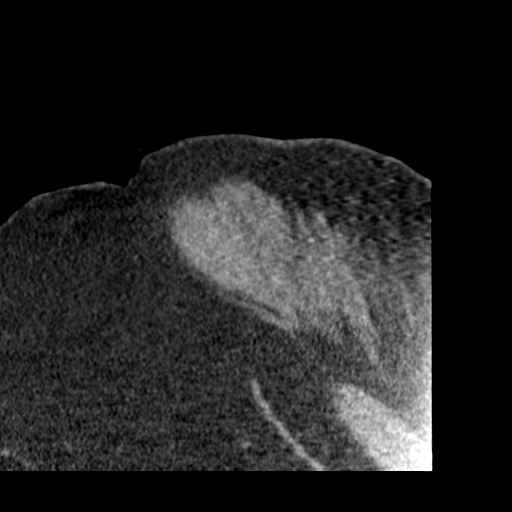
[im 44/87  soft-tissue]
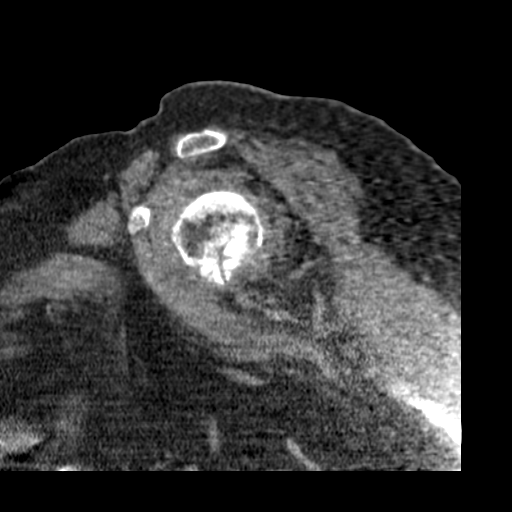
[im 65/87  soft-tissue]
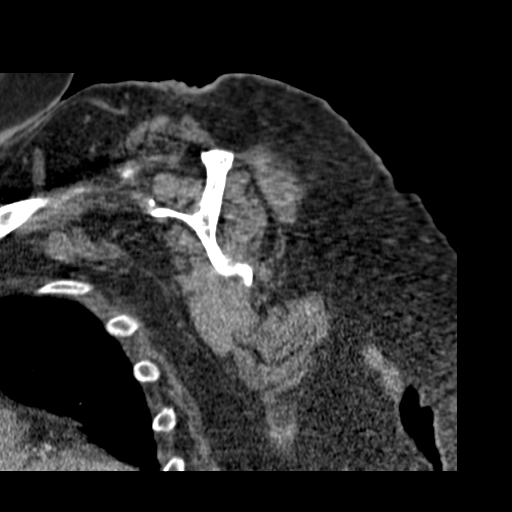

[Series 104: cor soft · oblique · 0.49mm/px · 3 of 93 slices shown]
[im 24/93  soft-tissue]
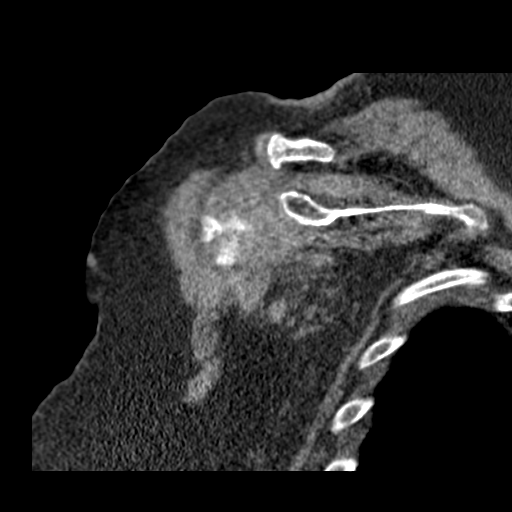
[im 47/93  soft-tissue]
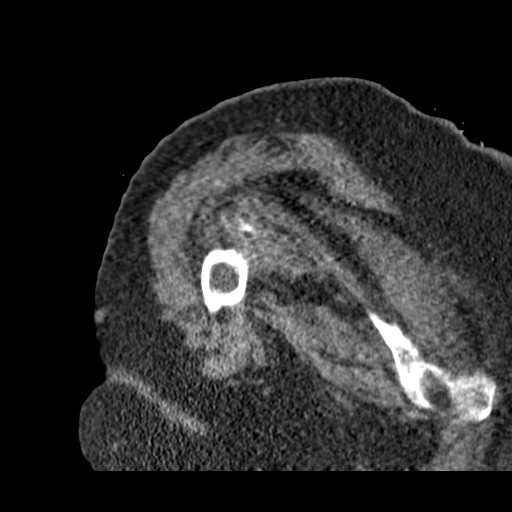
[im 70/93  soft-tissue]
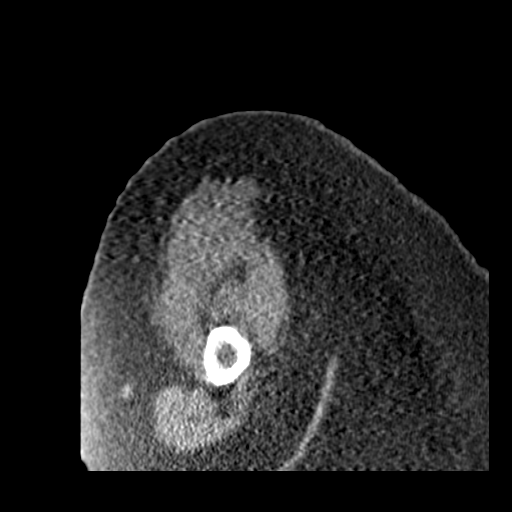

[8 of 14 positions shown; findings below may reference images not displayed]

FINDINGS: There is a comminuted slightly impacted fracture of the proximal
neck and base of the head of the left humerus. No significant
displacement. There is no dislocation. The articular surface of the
humeral head is intact. No obstructing bone fragments. The fracture
does extend through the greater tuberosity. There has been partial
healing of the fracture.
IMPRESSION: Partial healing of the impacted comminuted fracture of the proximal
left humerus. No obstructing bone fragments.

## 2013-12-06 ENCOUNTER — Telehealth: Payer: Self-pay | Admitting: Cardiovascular Disease

## 2013-12-06 NOTE — Telephone Encounter (Signed)
Spoke with pt, she reports her weight is 245 lbs. She is not currently taking the metolazone. Explained that according to dr c note, when her weight is above 240 she can restart the metolazone. Patient voiced understanding, she will take a metolazone tomorrow morning. Patient also made aware of her follow up appt on Monday.

## 2013-12-06 NOTE — Telephone Encounter (Signed)
Please call,. pt lost 10lbs and now she gained some of it back. She can not lose it,Dr C told her to call when this happen.

## 2013-12-12 ENCOUNTER — Ambulatory Visit (INDEPENDENT_AMBULATORY_CARE_PROVIDER_SITE_OTHER): Payer: Medicare Other | Admitting: Cardiology

## 2013-12-12 ENCOUNTER — Encounter: Payer: Self-pay | Admitting: Cardiology

## 2013-12-12 VITALS — BP 122/71 | HR 82 | Ht 63.0 in | Wt 245.1 lb

## 2013-12-12 DIAGNOSIS — I10 Essential (primary) hypertension: Secondary | ICD-10-CM

## 2013-12-12 DIAGNOSIS — Z79899 Other long term (current) drug therapy: Secondary | ICD-10-CM

## 2013-12-12 DIAGNOSIS — I5033 Acute on chronic diastolic (congestive) heart failure: Secondary | ICD-10-CM

## 2013-12-12 DIAGNOSIS — Z950 Presence of cardiac pacemaker: Secondary | ICD-10-CM

## 2013-12-12 DIAGNOSIS — G4733 Obstructive sleep apnea (adult) (pediatric): Secondary | ICD-10-CM

## 2013-12-12 NOTE — Assessment & Plan Note (Signed)
She is going to "pick up C-pap today"

## 2013-12-12 NOTE — Progress Notes (Signed)
12/12/2013 [name]   Jul 20, 1945  [id number]  Primary Physicia [name], MD Primary Cardiologist: Dr [name]  HPI:  68 y/o female, previously followed by Dr Rollene Fare. She had worked at the [employer] for many years. She had to stop working this year. Her husband, who was also a pt of our, died two years ago. She now lives with son and family.           She initially received a dual-chamber pacemaker in 2001 for second degree AV block. She then developed congestive heart failure. At one point left ventricular ejection fraction was severely depressed and her pacemaker was upgraded to a CRT-D device in 2008. She was a "hyperresponder" to CRT and EF returned to normal. Since then she had generator change out in 2014. Her biventricular defibrillator generator was changed out for a biventricular pacemaker Psychologist, occupational dual-chamber). Device check on September 25 showed excellent biventricular pacing, 100% and no recent episodes of atrial fibrillation or meaningful ventricular arrhythmia.  She has minor coronary atherosclerosis with lesions between 20% and 50% scattered throughout all 3 coronary arteries on previous caths in 2010 and June 2014.            She saw Dr [name] a few weeks ago. Her wgt has been steadily creeping up. At one point she tells me she weighed around 225 and felt pretty good. When she saw Dr [name] she was up to 255. He added metolazone. She tells me she only took two doses. Her wgt is down to 245 and she feels better but still feels like she can't do anything because of early DOE. She feels a lot of this has to do with stopping work and moving in with her son and family because of financial issues. She has been trying to do better with a low sodium diet.    Current Outpatient Prescriptions  Medication Sig Dispense Refill  . albuterol (PROVENTIL HFA;VENTOLIN HFA) 108 (90 BASE) MCG/ACT inhaler Inhale 2 puffs into the lungs every 4  (four) hours as needed for wheezing.     [name] amLODipine (NORVASC) 10 MG tablet Take 10 mg by mouth daily.    [name] aspirin 81 MG chewable tablet Chew 81 mg by mouth daily.    [name] BIOTIN PO Take 1 tablet by mouth daily.    . bisoprolol (ZEBETA) 5 MG tablet Take 1 tablet (5 mg total) by mouth daily. 30 tablet 11  . cetirizine (ZYRTEC) 10 MG tablet Take 10 mg by mouth daily.     . clotrimazole (MYCELEX) 10 MG troche One every 4 hours as needed for mouth pain 12 tablet 11  . doxycycline (VIBRA-TABS) 100 MG tablet Take 1 tablet (100 mg total) by mouth 2 (two) times daily. take after meals and avoid sunlight. WARNING  nausea . 10 tablet 0  . fluticasone (FLONASE) 50 MCG/ACT nasal spray Place 1 spray into the nose 2 (two) times daily as needed for rhinitis or allergies.     . furosemide (LASIX) 40 MG tablet Take 1 tablet (40 mg total) by mouth 2 (two) times daily. 60 tablet 5  . isosorbide mononitrate (IMDUR) 30 MG 24 hr tablet Take 1 tablet (30 mg total) by mouth daily. 30 tablet 11  . losartan (COZAAR) 100 MG tablet Take 1 tablet (100 mg total) by mouth daily. 90 tablet 2  . nitroGLYCERIN (NITROSTAT) 0.4 MG SL tablet Place 0.4 mg under the tongue every 5 (five) minutes as needed  for chest pain.    [name] omeprazole (PRILOSEC) 40 MG capsule Take 1 capsule by mouth daily.    . potassium chloride SA (K-DUR,KLOR-CON) 20 MEQ tablet Take 1 tablet (20 mEq total) by mouth daily. 60 tablet 11  . pravastatin (PRAVACHOL) 80 MG tablet Take 80 mg by mouth every evening.     [name] spironolactone (ALDACTONE) 25 MG tablet TAKE ONE-HALF TABLET BY MOUTH TWICE DAILY 30 tablet 11  . SYMBICORT 160-4.5 MCG/ACT inhaler Inhale 2 puffs into the lungs 2 (two) times daily.     No current facility-administered medications for this visit.    Allergies  Allergen Reactions  . Ivp Dye [Iodinated Diagnostic Agents] Shortness Of Breath  . Shellfish-Derived Products Anaphylaxis  . Sulfa Antibiotics Shortness Of Breath  . Iodine Hives     History   Social History  . Marital Status: Widowed    Spouse Name: N/A    Number of Children: 8  . Years of Education: N/A   Occupational History  . 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 Smoking status: Former Smoker -- 0.25 packs/day for 3 years    Types: Cigarettes    Quit date: 02/11/1968  . Smokeless tobacco: Never Used  . Alcohol Use: No  . Drug Use: No  . Sexual Activity: No   Other Topics Concern  . Not on file   Social History Narrative   Widowed last year. She lives with her two sons.     Review of Systems: Recent course of ABs per her pulmonologist for URI She developed a rash two days after she stopped antibiotics. General: negative for chills, fever, night sweats or weight changes.  Cardiovascular: negative for chest pain, dyspnea on exertion, edema, orthopnea, palpitations, paroxysmal nocturnal dyspnea or shortness of breath Dermatological: negative for rash Respiratory: negative for cough or wheezing Urologic: negative for hematuria Abdominal: negative for nausea, vomiting, diarrhea, bright red blood per rectum, melena, or hematemesis Neurologic: negative for visual changes, syncope, or dizziness All other systems reviewed and are otherwise negative except as noted above.    Blood pressure 122/71, pulse 82, height 5\' 3"  (1.6 m), weight 245 lb 1.6 oz (111.177 kg).  General appearance: alert, cooperative, no distress and morbidly obese Lungs: clear to auscultation bilaterally Heart: regular rate and rhythm Extremities: rash noted on her feet and shins    ASSESSMENT AND PLAN:   Diastolic CHF, acute on chronic Her wgt is down 10lb after 2 dose of metolazone  Essential hypertension Controlled  Cardiac resynchronization therapy pacemaker (CRT-P) in place Device change 2014- BS  OSA (obstructive sleep apnea) She is going to "pick up C-pap today"  Morbid obesity BMI 43   PLAN  I suggested she take  metolazone 2.5 mg every Tuesday and Sat. We'll check a BMP today. She is going to get fitted for C-pap today. I told her to try Benadryl for her rash but if this doesn't help she should contact her dermatologist. I'll see her back in two months.   [name] KPA-C 12/12/2013 12:08 PM

## 2013-12-12 NOTE — Patient Instructions (Signed)
Take Zaroxolyn 2.5mg  on Tuesdays and Saturdays.  Your physician recommends that you return for lab work in: [place].  Your physician recommends that you schedule a follow-up appointment in: 2 months with [facility].

## 2013-12-12 NOTE — Assessment & Plan Note (Signed)
Her wgt is down 10lb after 2 dose of metolazone

## 2013-12-12 NOTE — Assessment & Plan Note (Signed)
BMI 43 

## 2013-12-12 NOTE — Assessment & Plan Note (Signed)
Controlled.  

## 2013-12-12 NOTE — Assessment & Plan Note (Signed)
Device change 2014- BS

## 2013-12-13 LAB — BASIC METABOLIC PANEL
BUN: 23 mg/dL (ref 6–23)
CO2: 29 mEq/L (ref 19–32)
Calcium: 9.4 mg/dL (ref 8.4–10.5)
Chloride: 98 mEq/L (ref 96–112)
Creat: 1.31 mg/dL — ABNORMAL HIGH (ref 0.50–1.10)
Glucose, Bld: 115 mg/dL — ABNORMAL HIGH (ref 70–99)
Potassium: 4 mEq/L (ref 3.5–5.3)
Sodium: 141 mEq/L (ref 135–145)

## 2014-01-09 ENCOUNTER — Other Ambulatory Visit: Payer: Self-pay | Admitting: Family Medicine

## 2014-01-09 DIAGNOSIS — R109 Unspecified abdominal pain: Secondary | ICD-10-CM

## 2014-01-09 DIAGNOSIS — R1084 Generalized abdominal pain: Secondary | ICD-10-CM

## 2014-01-10 ENCOUNTER — Other Ambulatory Visit: Payer: Self-pay | Admitting: Family Medicine

## 2014-01-10 ENCOUNTER — Ambulatory Visit
Admission: RE | Admit: 2014-01-10 | Discharge: 2014-01-10 | Disposition: A | Discharge status: Home / Self Care | Payer: Medicare Other | Source: Ambulatory Visit | Attending: Family Medicine | Admitting: Family Medicine

## 2014-01-10 DIAGNOSIS — R1084 Generalized abdominal pain: Secondary | ICD-10-CM

## 2014-01-10 DIAGNOSIS — R109 Unspecified abdominal pain: Secondary | ICD-10-CM

## 2014-01-11 ENCOUNTER — Ambulatory Visit (HOSPITAL_COMMUNITY): Admission: RE | Admit: 2014-01-11 | Payer: Medicare Other | Source: Ambulatory Visit

## 2014-01-11 ENCOUNTER — Other Ambulatory Visit (HOSPITAL_COMMUNITY): Payer: Self-pay | Admitting: Family Medicine

## 2014-01-11 DIAGNOSIS — R071 Chest pain on breathing: Secondary | ICD-10-CM

## 2014-01-16 ENCOUNTER — Telehealth: Payer: Self-pay | Admitting: Pulmonary Disease

## 2014-01-16 NOTE — Telephone Encounter (Signed)
Referral Notes     Type Date User   General 11/22/2013 3:40 PM COBB, RHONDA J        Note   Order faxed to [facility] [facility] to arrange Bilevel set up. Contact patient on son's phone number [name]) [phone]. Rhonda J Cobb      [facility] and unable to leave a message. Will hold for Triage to call back again later today.

## 2014-01-17 ENCOUNTER — Ambulatory Visit: Payer: Medicare Other | Admitting: Pulmonary Disease

## 2014-01-17 NOTE — Telephone Encounter (Signed)
ATC pt - Unable to leave message at home number - Will try back [place] x 1 on mobile number

## 2014-01-18 ENCOUNTER — Other Ambulatory Visit: Payer: Self-pay | Admitting: Family Medicine

## 2014-01-18 ENCOUNTER — Ambulatory Visit
Admission: RE | Admit: 2014-01-18 | Discharge: 2014-01-18 | Disposition: A | Discharge status: Home / Self Care | Payer: Medicare Other | Source: Ambulatory Visit | Attending: Family Medicine | Admitting: Family Medicine

## 2014-01-18 DIAGNOSIS — J189 Pneumonia, unspecified organism: Secondary | ICD-10-CM

## 2014-01-18 NOTE — Telephone Encounter (Signed)
Spoke with patient-states she nor her son has had any returned calls from [facility] for her Bilevel set up in 11-2013. She has called them several times with no response. Pt would like a call back today regarding this matter. Will forward to PCC's to assist in this matter.

## 2014-01-18 NOTE — Telephone Encounter (Signed)
Spoke to [email or web address]  she will call pt today, pt's ins has not responded to the request for bipap to be purchased she is going to refax all info with an urgent request and she will call pt. Pt is aware of this [name]

## 2014-01-19 ENCOUNTER — Encounter: Payer: Self-pay | Admitting: Pulmonary Disease

## 2014-01-19 ENCOUNTER — Encounter (HOSPITAL_COMMUNITY): Payer: Self-pay | Admitting: Cardiology

## 2014-01-26 ENCOUNTER — Encounter: Payer: Medicare Other | Admitting: Cardiovascular Disease

## 2014-01-31 ENCOUNTER — Encounter: Payer: Medicare Other | Attending: Family Medicine

## 2014-02-07 ENCOUNTER — Ambulatory Visit: Payer: Medicare Other

## 2014-02-14 ENCOUNTER — Ambulatory Visit: Payer: Medicare Other

## 2014-02-15 ENCOUNTER — Encounter: Payer: Self-pay | Admitting: *Deleted

## 2014-03-09 ENCOUNTER — Encounter: Payer: Medicare Other | Attending: Family Medicine

## 2014-03-09 VITALS — Ht 64.0 in | Wt 255.5 lb

## 2014-03-09 DIAGNOSIS — Z713 Dietary counseling and surveillance: Secondary | ICD-10-CM | POA: Insufficient documentation

## 2014-03-09 DIAGNOSIS — E119 Type 2 diabetes mellitus without complications: Secondary | ICD-10-CM | POA: Insufficient documentation

## 2014-03-10 ENCOUNTER — Encounter: Payer: Self-pay | Admitting: *Deleted

## 2014-03-12 NOTE — Progress Notes (Signed)

## 2014-03-16 ENCOUNTER — Encounter: Payer: Medicare Other | Attending: Family Medicine

## 2014-03-16 DIAGNOSIS — E119 Type 2 diabetes mellitus without complications: Secondary | ICD-10-CM | POA: Diagnosis not present

## 2014-03-16 DIAGNOSIS — Z713 Dietary counseling and surveillance: Secondary | ICD-10-CM | POA: Diagnosis not present

## 2014-03-16 NOTE — Progress Notes (Signed)

## 2014-03-23 DIAGNOSIS — E119 Type 2 diabetes mellitus without complications: Secondary | ICD-10-CM | POA: Diagnosis not present

## 2014-03-27 NOTE — Progress Notes (Signed)
Patient was seen on 03/23/14 for the third of a series of three diabetes self-management courses at the [facility]. The following learning objectives were met by the patient during this class:  . State the amount of activity recommended for healthy living . Describe activities suitable for individual needs . Identify ways to regularly incorporate activity into daily life . Identify barriers to activity and ways to over come these barriers  Identify diabetes medications being personally used and their primary action for lowering glucose and possible side effects . Describe role of stress on blood glucose and develop strategies to address psychosocial issues . Identify diabetes complications and ways to prevent them  Explain how to manage diabetes during illness . Evaluate success in meeting personal goal . Establish 2-3 goals that they will plan to diligently work on until they return for the  4-month follow-up visit  Goals:   I will count my carb choices at most meals and snacks  I will be active  5 times a week  I will test my glucose at least 2times a day, 7 days a week  I will look at patterns in my record book at least 3 days a month  To help manage stress I will  exercis at least 5 times a week  Your patient has identified these potential barriers to change:  Finances  Your patient has identified their diabetes self-care support plan as  Family Support Plan:  Attend Core 4 in 4 months   

## 2014-04-04 ENCOUNTER — Ambulatory Visit (INDEPENDENT_AMBULATORY_CARE_PROVIDER_SITE_OTHER): Payer: Medicare Other | Admitting: Cardiovascular Disease

## 2014-04-04 ENCOUNTER — Telehealth: Payer: Self-pay | Admitting: *Deleted

## 2014-04-04 ENCOUNTER — Encounter: Payer: Self-pay | Admitting: Cardiovascular Disease

## 2014-04-04 VITALS — BP 122/72 | HR 72 | Resp 20 | Ht 63.0 in | Wt 254.6 lb

## 2014-04-04 DIAGNOSIS — I428 Other cardiomyopathies: Secondary | ICD-10-CM

## 2014-04-04 DIAGNOSIS — E782 Mixed hyperlipidemia: Secondary | ICD-10-CM

## 2014-04-04 DIAGNOSIS — I5033 Acute on chronic diastolic (congestive) heart failure: Secondary | ICD-10-CM

## 2014-04-04 DIAGNOSIS — R0602 Shortness of breath: Secondary | ICD-10-CM | POA: Diagnosis not present

## 2014-04-04 DIAGNOSIS — I429 Cardiomyopathy, unspecified: Secondary | ICD-10-CM | POA: Diagnosis not present

## 2014-04-04 DIAGNOSIS — Z79899 Other long term (current) drug therapy: Secondary | ICD-10-CM | POA: Diagnosis not present

## 2014-04-04 DIAGNOSIS — I441 Atrioventricular block, second degree: Secondary | ICD-10-CM

## 2014-04-04 DIAGNOSIS — I10 Essential (primary) hypertension: Secondary | ICD-10-CM

## 2014-04-04 DIAGNOSIS — Z950 Presence of cardiac pacemaker: Secondary | ICD-10-CM

## 2014-04-04 LAB — MDC_IDC_ENUM_SESS_TYPE_INCLINIC
Battery Remaining Longevity: 9.5
Brady Statistic RA Percent Paced: 18 %
Brady Statistic RV Percent Paced: 100 %
Date Time Interrogation Session: 20160223050000
Implantable Pulse Generator Serial Number: 100731
Lead Channel Impedance Value: 1619 Ohm
Lead Channel Impedance Value: 633 Ohm
Lead Channel Impedance Value: 896 Ohm
Lead Channel Pacing Threshold Amplitude: 0.7 V
Lead Channel Pacing Threshold Amplitude: 0.9 V
Lead Channel Pacing Threshold Amplitude: 1.7 V
Lead Channel Pacing Threshold Pulse Width: 0.4 ms
Lead Channel Pacing Threshold Pulse Width: 0.4 ms
Lead Channel Pacing Threshold Pulse Width: 0.8 ms
Lead Channel Sensing Intrinsic Amplitude: 17 mV
Lead Channel Sensing Intrinsic Amplitude: 20.7 mV
Lead Channel Sensing Intrinsic Amplitude: 5.3 mV
Lead Channel Setting Pacing Amplitude: 2 V
Lead Channel Setting Pacing Amplitude: 2 V
Lead Channel Setting Pacing Amplitude: 2.4 V
Lead Channel Setting Pacing Pulse Width: 0.4 ms
Lead Channel Setting Pacing Pulse Width: 0.8 ms
Lead Channel Setting Sensing Sensitivity: 2.5 mV
Lead Channel Setting Sensing Sensitivity: 2.5 mV
Zone Setting Detection Interval: 375 ms

## 2014-04-04 LAB — BASIC METABOLIC PANEL
BUN: 19 mg/dL (ref 6–23)
CO2: 25 mEq/L (ref 19–32)
Calcium: 9.4 mg/dL (ref 8.4–10.5)
Chloride: 102 mEq/L (ref 96–112)
Creat: 1.05 mg/dL (ref 0.50–1.10)
Glucose, Bld: 129 mg/dL — ABNORMAL HIGH (ref 70–99)
Potassium: 4.5 mEq/L (ref 3.5–5.3)
Sodium: 137 mEq/L (ref 135–145)

## 2014-04-04 NOTE — Patient Instructions (Signed)
Your physician recommends that you return for lab work in: TODAY at [facility].  Your home scale dry weight is 242-247 lbs.  If your weight is 250 lbs or greater take a Metolazone but no more than 3 times per week.  Call if you need more than 3 times per week.   Dr. [name] recommends that you schedule a follow-up appointment in: 3 months with a pacemaker check.

## 2014-04-04 NOTE — Telephone Encounter (Signed)
No answer at patient's number.  Needs BMET this month and every 3 months.  There is an order already by Richardson Dopp, PA.  Will call to verify patient is going to the lab this month.

## 2014-04-05 LAB — BRAIN NATRIURETIC PEPTIDE: Brain Natriuretic Peptide: 85.2 pg/mL (ref 0.0–100.0)

## 2014-04-06 ENCOUNTER — Encounter: Payer: Self-pay | Admitting: Cardiovascular Disease

## 2014-04-06 NOTE — Progress Notes (Signed)
Patient ID: [name], female   DOB: [birth year], 69 y.o.   MRN: [id number]     Reason for office visit Pacemaker follow-up (history of second-degree AV block), diastolic heart failure, dyspnea  Since her last appointment [name] major complaint remains that of significant exertional dyspnea (NYHA functional class III). When she tries to do more than usual physical activity she becomes "very weak". She is morbidly obese and has recently been diagnosed with "borderline" diabetes mellitus.  Her [place] cardiac resynchronization pacemaker is functioning normally. It shows 100% biventricular pacing. Her underlying rhythm is sinus rhythm with 2-1 atrioventricular block. The device has recorded one brief episode of paroxysmal atrial tachycardia but no sustained atrial fibrillation or ventricular arrhythmia.  She initially received a dual-chamber pacemaker in 2001 for second degree AV block. She then developed congestive heart failure. At one point left ventricular ejection fraction was severely depressed and her pacemaker was upgraded to a CRT-D device in 2008. She was a "hyperresponder" to CRT and EF returned to normal. Sequentially, at the time of her generator change out in 2014 her biventricular defibrillator generator was changed out for a biventricular pacemaker [facility] dual-chamber).  She has minor coronary atherosclerosis with lesions between 20% and 50% scattered throughout all 3 coronary arteries but does not have a history of myocardial infarction. She had a low risk nuclear scan in October of 2013.   Her most recent echocardiogram in October 2015 showed a left ventricular ejection fraction of 65-70 percent. There was at most equivocal evidence for elevated filling pressures due to diastolic dysfunction on that study  She has not had problems with lower extremity edema, orthopnea or PND. She wheezes virtually daily. At home her weight oscillates  between 242 and 247 pounds and her home scale usually records 3 pounds less than our office scale dose. She has significant obstructive sleep apnea but has not tolerated CPAP. Recently she has successfully undergone a CPAP titration procedure but I don't think she has yet started to wear the device. It has been a challenge to distinguish between pulmonary and cardiac etiology for her dyspnea.    Allergies  Allergen Reactions  . Ivp Dye [Iodinated Diagnostic Agents] Shortness Of Breath  . Shellfish-Derived Products Anaphylaxis  . Sulfa Antibiotics Shortness Of Breath  . Iodine Hives    Current Outpatient Prescriptions  Medication Sig Dispense Refill  . albuterol (PROVENTIL HFA;VENTOLIN HFA) 108 (90 BASE) MCG/ACT inhaler Inhale 2 puffs into the lungs every 4 (four) hours as needed for wheezing.     [name] amLODipine (NORVASC) 10 MG tablet Take 10 mg by mouth daily.    [name] aspirin 81 MG chewable tablet Chew 81 mg by mouth daily.    [name] BIOTIN PO Take 1 tablet by mouth daily.    . bisoprolol (ZEBETA) 5 MG tablet Take 1 tablet (5 mg total) by mouth daily. 30 tablet 11  . cetirizine (ZYRTEC) 10 MG tablet Take 10 mg by mouth daily.     . clotrimazole (MYCELEX) 10 MG troche One every 4 hours as needed for mouth pain 12 tablet 11  . fluticasone (FLONASE) 50 MCG/ACT nasal spray Place 1 spray into the nose 2 (two) times daily as needed for rhinitis or allergies.     . furosemide (LASIX) 40 MG tablet Take 1 tablet (40 mg total) by mouth 2 (two) times daily. 60 tablet 5  . isosorbide mononitrate (IMDUR) 30 MG 24 hr tablet Take 1 tablet (30 mg total) by  mouth daily. 30 tablet 11  . losartan (COZAAR) 100 MG tablet Take 1 tablet (100 mg total) by mouth daily. 90 tablet 2  . metolazone (ZAROXOLYN) 2.5 MG tablet Take 2.5 mg by mouth 2 (two) times a week. Take one tablet on Tuesdays and Saturdays 30 minutes before the Furosemide.    . nitroGLYCERIN (NITROSTAT) 0.4 MG SL tablet Place 0.4 mg under the tongue every 5  (five) minutes as needed for chest pain.    [name] omeprazole (PRILOSEC) 40 MG capsule Take 1 capsule by mouth daily.    . potassium chloride SA (K-DUR,KLOR-CON) 20 MEQ tablet Take 1 tablet (20 mEq total) by mouth daily. 60 tablet 11  . pravastatin (PRAVACHOL) 80 MG tablet Take 80 mg by mouth every evening.     [name] spironolactone (ALDACTONE) 25 MG tablet TAKE ONE-HALF TABLET BY MOUTH TWICE DAILY 30 tablet 11  . SYMBICORT 160-4.5 MCG/ACT inhaler Inhale 2 puffs into the lungs 2 (two) times daily.     No current facility-administered medications for this visit.    Past Medical History  Diagnosis Date  . Asthma   . Hypertension   . Arthritis   . GERD (gastroesophageal reflux disease)   . Diverticulosis   . Anemia   . CAD (coronary artery disease)   . Fatty liver disease, nonalcoholic   . Internal hemorrhoids   . Gastritis   . Hyperplastic colon polyp   . Sleep apnea   . ICD (implantable cardiac defibrillator) in place   . Shortness of breath   . IBS (irritable bowel syndrome)   . Anxiety   . CHF (congestive heart failure)   . H/O hiatal hernia   . Obesity   . Back pain   . Diabetes mellitus without complication     Past Surgical History  Procedure Laterality Date  . Pacemaker insertion  2001    BS BivICD  . Cardiac defibrillator placement    . Appendectomy    . Knee surgery      right  . Ankle surgery      right  . Tubal ligation    . Cervical spine surgery      plate   . Coronary angiogram  2010  . Abdominal ultrasound  12/01/2011    Peripelvic cysts- #1- 2.4x1.9x2.3cm, #2-1.2x0.9x1.2cm  . Cardiac catheterization  05/17/1999    No significant coronary obstructive disease w/ mild 20% luminal irregularity of the first diag branch of the LAD  . Cardiac catheterization  07/08/2002    No significant CAD, moderately depressed LV systolic function  . Cardiac catheterization Bilateral 04/26/2007    Normal findings, recommend medical therapy  . Cardiac catheterization  02/18/2008     Moderate CAD, would benefit from having a functional study, recommend continue medical therapy  . Cardiac catheterization  07/23/2012    Medical therapy  . Lexiscan myoview  11/14/2011    Mild-moderate defect seen in Mid Inferolateral and Mid Anterolateral regions-consistant w/ infarct/scar. No significant ischemia demonstrated.  . Transthoracic echocardiogram  07/23/2012    EF 55-60%, normal-mild  . Left heart catheterization with coronary angiogram N/A 07/23/2012    Procedure: LEFT HEART CATHETERIZATION WITH CORONARY ANGIOGRAM;  Surgeon: Leonie Man, MD;  Location: [facility] CATH LAB;  Service: Cardiovascular;  Laterality: N/A;  . Biv pacemaker generator change out N/A 11/02/2012    Procedure: BIV PACEMAKER GENERATOR CHANGE OUT;  Surgeon: Sanda Klein, MD;  Location: [facility] CATH LAB;  Service: Cardiovascular;  Laterality: N/A;    Family History  Problem Relation Age of Onset  . Breast cancer Mother   . Diabetes Mother   . Heart disease Maternal Grandmother   . Kidney disease Maternal Grandmother   . Diabetes Maternal Grandmother   . Glaucoma Maternal Aunt   . Heart disease Maternal Aunt   . Asthma Sister     History   Social History  . Marital Status: Widowed    Spouse Name: N/A  . Number of Children: 5  . Years of Education: N/A   Occupational History  . 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 Smoking status: Former Smoker -- 0.25 packs/day for 3 years    Types: Cigarettes    Quit date: 02/11/1968  . Smokeless tobacco: Never Used  . Alcohol Use: No  . Drug Use: No  . Sexual Activity: No   Other Topics Concern  . Not on file   Social History Narrative   Widowed last year. She lives with her two sons.    Review of systems: Shortness of breath with minimal exertion, NYHA functional class III, increased weight, She also complains of "pinched nerve" type sensation in her left cervical and infraclavicular area.  The patient specifically denies  any chest pain at rest or with exertion, dyspnea at rest, orthopnea, paroxysmal nocturnal dyspnea, syncope, palpitations, focal neurological deficits, intermittent claudication, unexplained weight gain, cough, hemoptysis or wheezing.  The patient also denies abdominal pain, nausea, vomiting, dysphagia, diarrhea, constipation, polyuria, polydipsia, dysuria, hematuria, frequency, urgency, abnormal bleeding or bruising, fever, chills, unexpected weight changes, mood swings, change in skin or hair texture, change in voice quality, auditory or visual problems, allergic reactions or rashes, new musculoskeletal complaints other than usual "aches and pains".  PHYSICAL EXAM BP 122/72 mmHg  Pulse 72  Resp 20  Ht 5\' 3"  (1.6 m)  Wt 254 lb 9.6 oz (115.486 kg)  BMI 45.11 kg/m2 General: Alert, oriented x3, no distress. Morbidly obese  Head: no evidence of trauma, PERRL, EOMI, no exophtalmos or lid lag, no myxedema, no xanthelasma; normal ears, nose and oropharynx  Neck: normal jugular venous pulsations and no hepatojugular reflux; brisk carotid pulses without delay and no carotid bruits  Chest: clear to auscultation, no signs of consolidation by percussion or palpation, normal fremitus, symmetrical and full respiratory excursions; healthy right subclavian pacemaker site  Cardiovascular: normal position and quality of the apical impulse, regular rhythm, normal first and second heart sounds, no murmurs, rubs or gallops  Abdomen: no tenderness or distention, no masses by palpation, no abnormal pulsatility or arterial bruits, normal bowel sounds, no hepatosplenomegaly  Extremities: no clubbing, cyanosis; 2+ pedal symmetrical edema; 2+ radial, ulnar and brachial pulses bilaterally; 2+ right femoral, posterior tibial and dorsalis pedis pulses; 2+ left femoral, posterior tibial and dorsalis pedis pulses; no subclavian or femoral bruits  Neurological: grossly nonfocal  EKG: Atrial sensed, biventricular  paced  Lipid Panel     Component Value Date/Time   CHOL 234* 11/16/2013 0938   TRIG 187* 11/16/2013 0938   HDL 49 11/16/2013 0938   CHOLHDL 4.8 11/16/2013 0938   VLDL 37 11/16/2013 0938   LDLCALC 148* 11/16/2013 0938    BMET    Component Value Date/Time   NA 137 04/04/2014 1014   K 4.5 04/04/2014 1014   CL 102 04/04/2014 1014   CO2 25 04/04/2014 1014   GLUCOSE 129* 04/04/2014 1014   BUN 19 04/04/2014 1014   CREATININE 1.05 04/04/2014 1014   CREATININE 1.08 11/18/2013 1049   CALCIUM  9.4 04/04/2014 1014   GFRNONAA 51* 11/18/2013 1049   GFRAA 70* 11/18/2013 1049     ASSESSMENT AND PLAN  As before, it is challenging to distinguish between pulmonary causes of dyspnea (morbid obesity, untreated obstructive sleep apnea, bronchospasm) and diastolic heart failure in this lady.  We'll attempt to continue with a "tailored" diuretic prescription with a target home weight of 242-247 pounds (she is over this today). The findings have been equivocal and have never overtly demonstrated excessive left atrial pressure and the BNP may be quite misleading in a patient with her body habitus. Discussed again the fact that sometimes only a right and left heart catheterization clarifies the cause of dyspnea.  Her cardiac resynchronization pacemaker is functioning normally. Her blood pressure is well controlled. Her last lipid profile was not favorable. Plan to recheck her lipids in about 3 months.  Special weight loss would be highly beneficial  Patient Instructions  Your physician recommends that you return for lab work in: TODAY at [facility].  Your home scale dry weight is 242-247 lbs.  If your weight is 250 lbs or greater take a Metolazone but no more than 3 times per week.  Call if you need more than 3 times per week.   Dr. [name] recommends that you schedule a follow-up appointment in: 3 months with a pacemaker check.       Orders Placed This Encounter  Procedures  . Basic  metabolic panel  . Brain natriuretic peptide  . Implantable device check   No orders of the defined types were placed in this encounter.    Holli Humbles, MD, [place] [phone] office [phone] pager

## 2014-04-11 IMAGING — CR DG CHEST 2V
2 series · 2 of 2 positions shown · non-contrast
Comparison: 11/24/2012

CLINICAL DATA: Weakness, hypertension

EXAM:
CHEST  2 VIEW

[w chest pa]
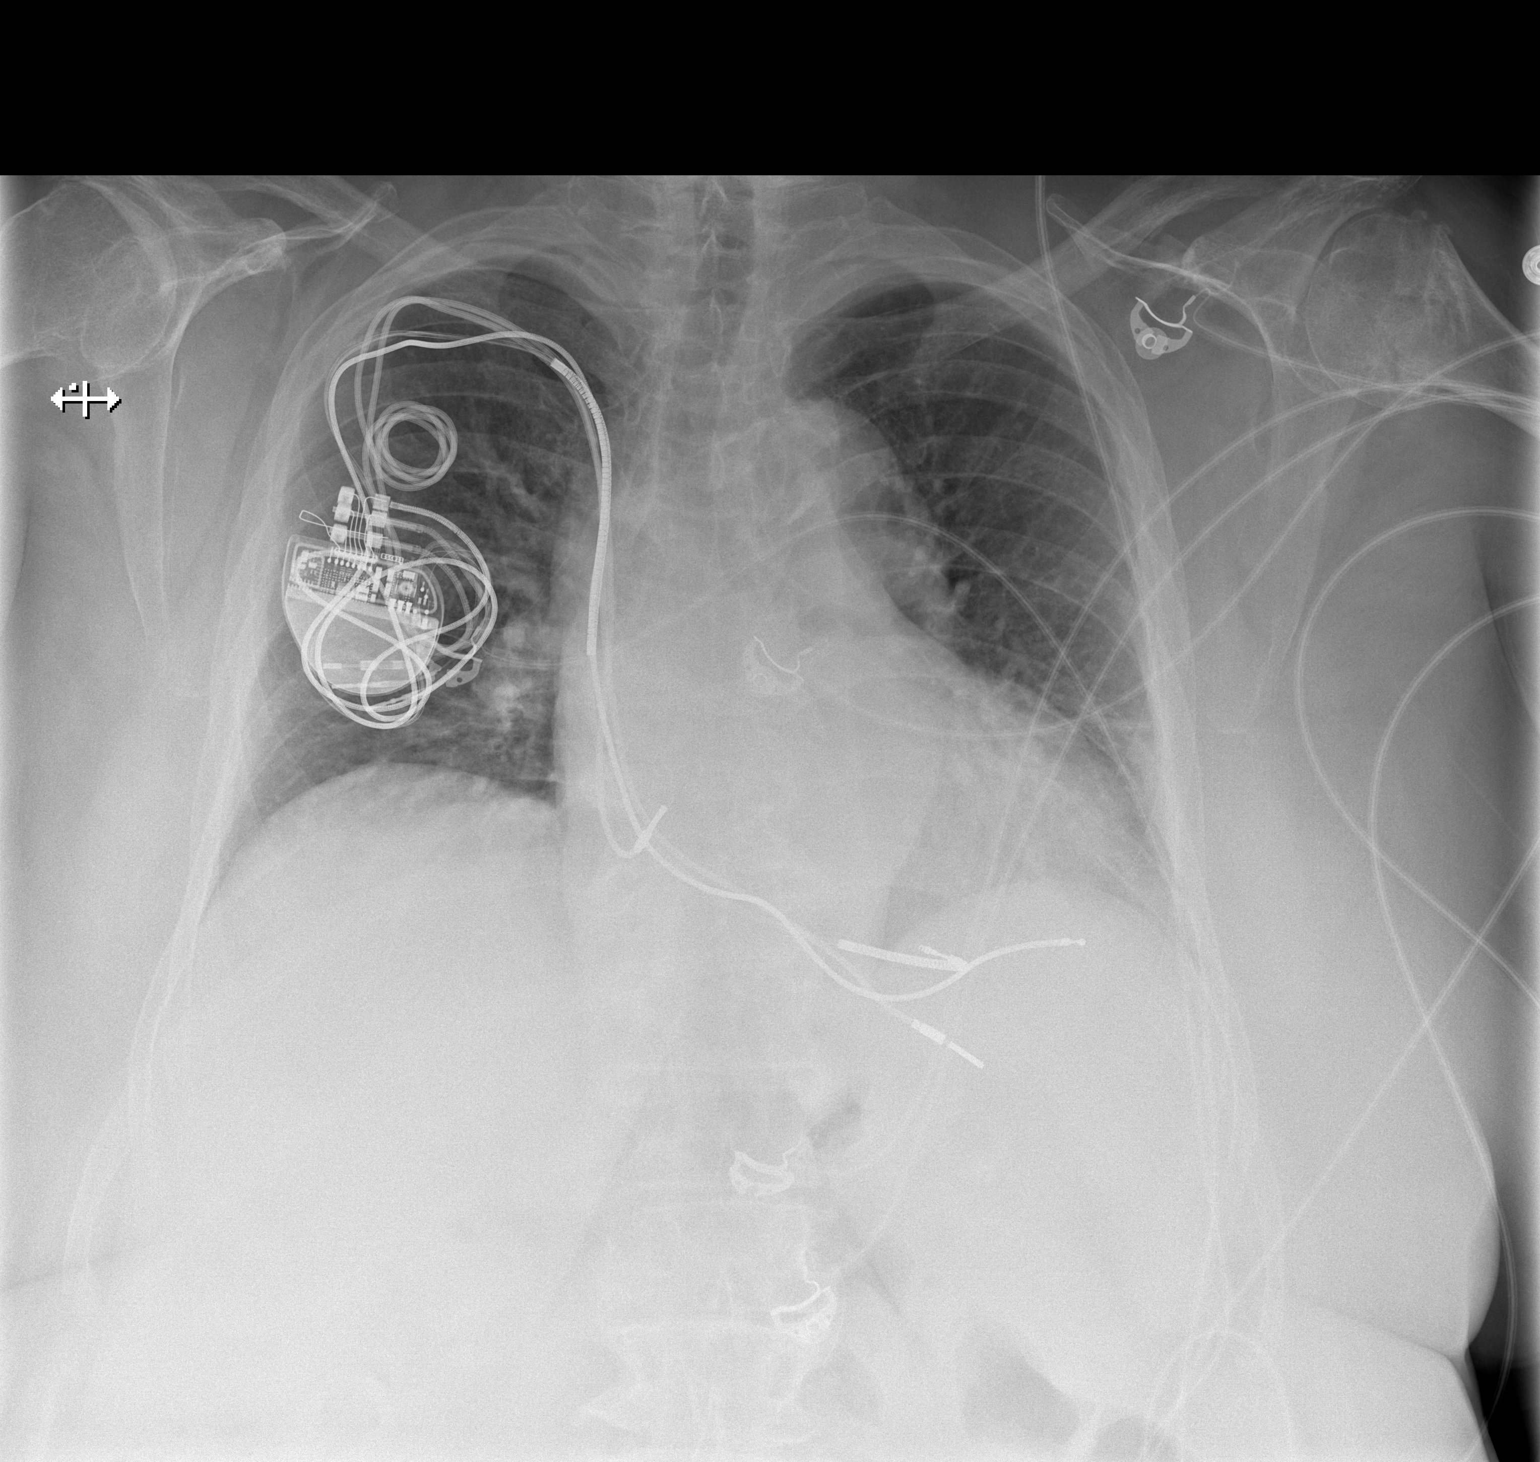

[w chest lat]
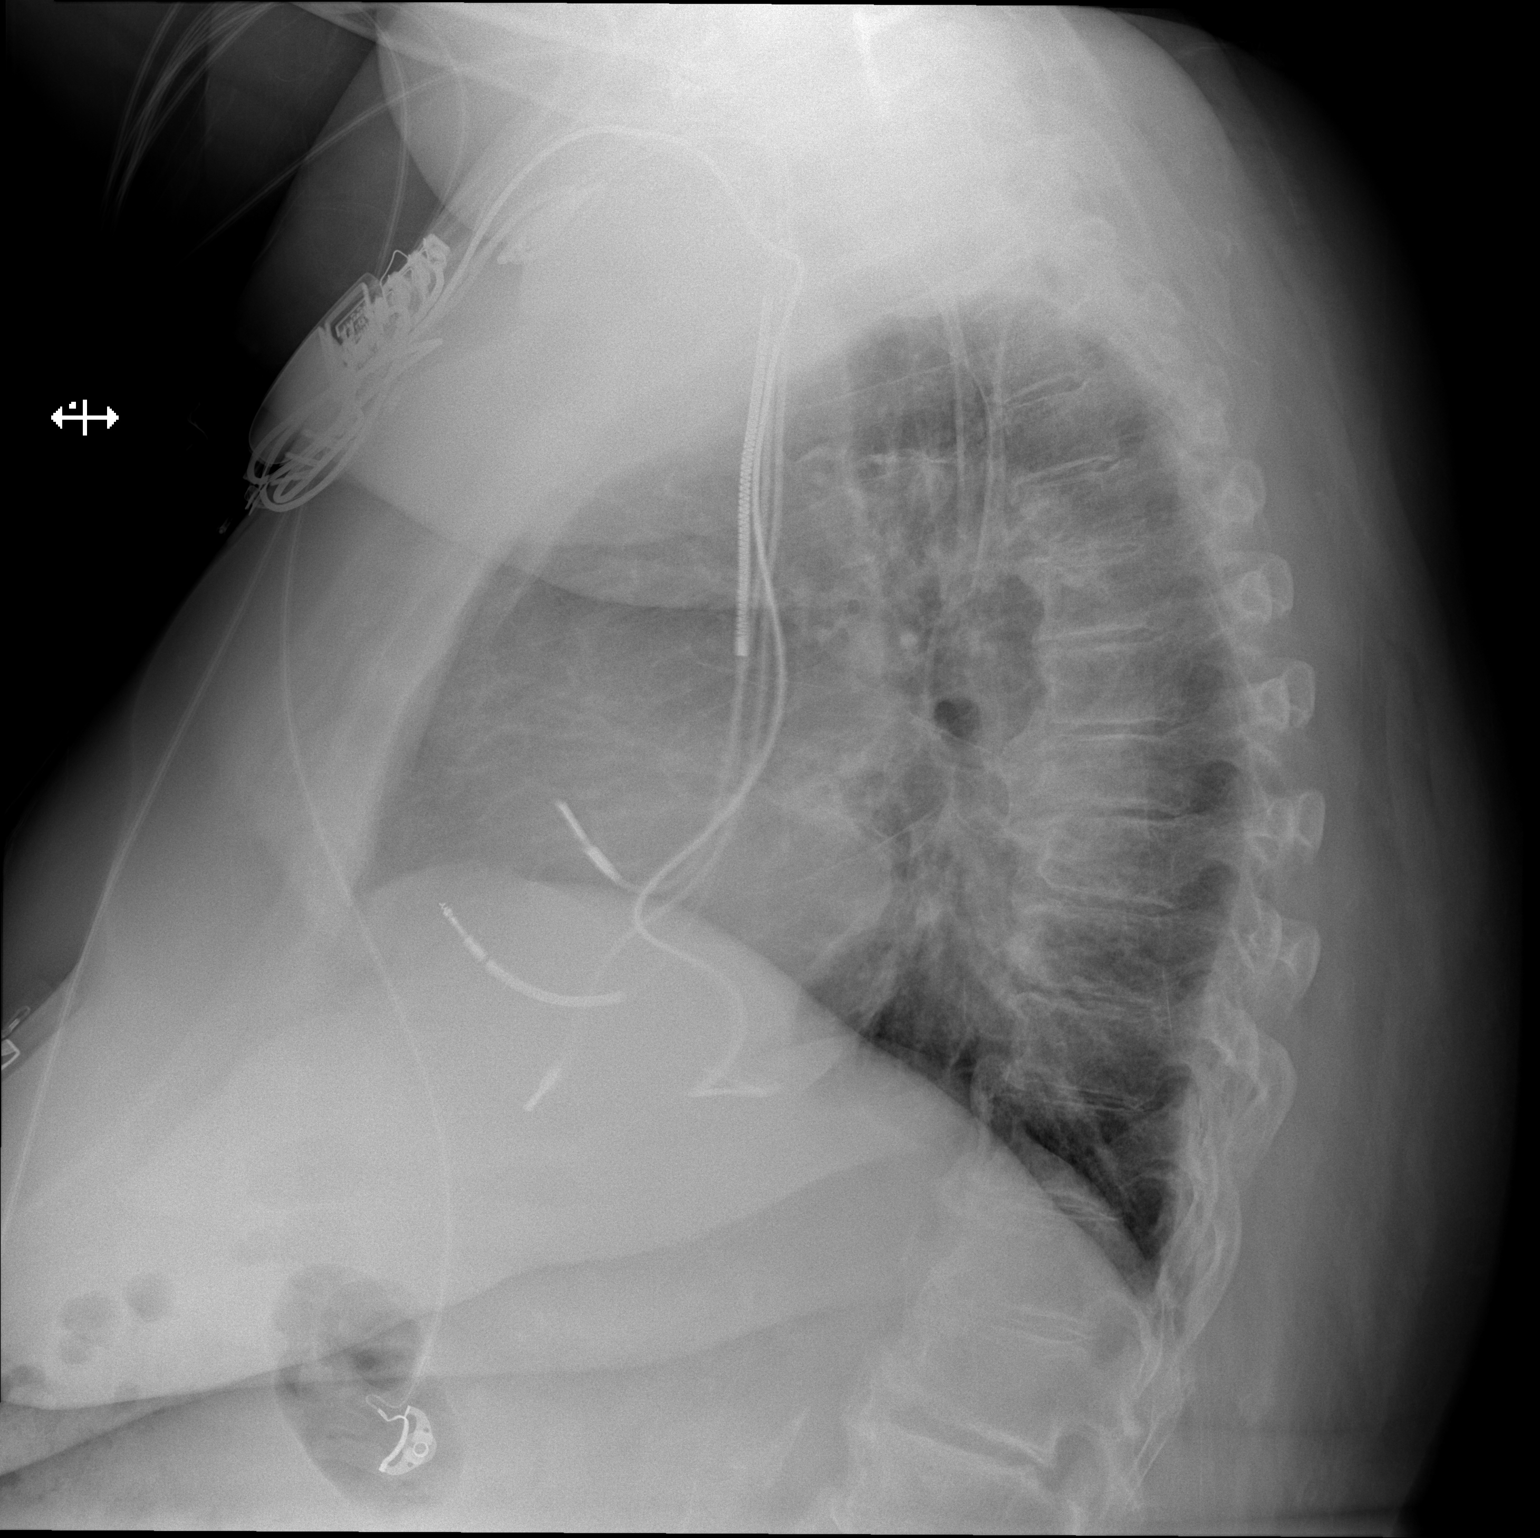

[2 of 2 positions shown; findings below may reference images not displayed]

FINDINGS: Cardiomegaly again noted. 4 leads cardiac pacemaker is unchanged in
position. No acute infiltrate or pulmonary edema. Mild degenerative
changes thoracic spine.
IMPRESSION: No active cardiopulmonary disease.

## 2014-04-25 ENCOUNTER — Telehealth: Payer: Self-pay | Admitting: Cardiovascular Disease

## 2014-04-25 NOTE — Telephone Encounter (Signed)
I have talked with [name] about this pt. And given her the last EF

## 2014-05-31 ENCOUNTER — Other Ambulatory Visit: Payer: Self-pay | Admitting: Cardiovascular Disease

## 2014-06-22 ENCOUNTER — Telehealth: Payer: Self-pay | Admitting: Cardiovascular Disease

## 2014-06-22 NOTE — Telephone Encounter (Signed)
I agree that if she has no swelling or change in weight, it is more likely to be a pulmonary, rather than cardiac problem

## 2014-06-22 NOTE — Telephone Encounter (Signed)
Patient states she has been feeling bad. She states she went to the [facility] on Monday because she could not she her primary. Urgent care diagnosis her with bronchitis - Antibiotic given no change come back on Thursday-XRAY DONE -antibiotics and Z-PAK GIVEN. She states she developed a rash last night.She stopped z-pak Patient states she went to urgent care today--   Received steroids ,but she states still feels tight in her lungs and thought she need to contact her cardiologist. Symptoms -  Some chest pain , no weight gain, very little lowerexteremity swelling.  RN informed patient her symptoms send more pulmonary than her heart. It would be best to contact Dr Gustavus Bryant office. If symptoms become worse go to ER. She voiced understanding.

## 2014-06-25 ENCOUNTER — Encounter (HOSPITAL_COMMUNITY): Payer: Self-pay | Admitting: Emergency Medicine

## 2014-06-25 ENCOUNTER — Emergency Department (HOSPITAL_COMMUNITY): Payer: Medicare Other

## 2014-06-25 ENCOUNTER — Emergency Department (HOSPITAL_COMMUNITY)
Admission: EM | Admit: 2014-06-25 | Discharge: 2014-06-25 | Disposition: A | Discharge status: Home / Self Care | Payer: Medicare Other | Attending: Emergency Medicine | Admitting: Emergency Medicine

## 2014-06-25 DIAGNOSIS — Z8659 Personal history of other mental and behavioral disorders: Secondary | ICD-10-CM | POA: Insufficient documentation

## 2014-06-25 DIAGNOSIS — E669 Obesity, unspecified: Secondary | ICD-10-CM | POA: Insufficient documentation

## 2014-06-25 DIAGNOSIS — Z9851 Tubal ligation status: Secondary | ICD-10-CM | POA: Insufficient documentation

## 2014-06-25 DIAGNOSIS — Z86018 Personal history of other benign neoplasm: Secondary | ICD-10-CM | POA: Insufficient documentation

## 2014-06-25 DIAGNOSIS — Z950 Presence of cardiac pacemaker: Secondary | ICD-10-CM | POA: Diagnosis not present

## 2014-06-25 DIAGNOSIS — I10 Essential (primary) hypertension: Secondary | ICD-10-CM | POA: Insufficient documentation

## 2014-06-25 DIAGNOSIS — E119 Type 2 diabetes mellitus without complications: Secondary | ICD-10-CM | POA: Diagnosis not present

## 2014-06-25 DIAGNOSIS — Z7982 Long term (current) use of aspirin: Secondary | ICD-10-CM | POA: Diagnosis not present

## 2014-06-25 DIAGNOSIS — R109 Unspecified abdominal pain: Secondary | ICD-10-CM

## 2014-06-25 DIAGNOSIS — Z9049 Acquired absence of other specified parts of digestive tract: Secondary | ICD-10-CM | POA: Insufficient documentation

## 2014-06-25 DIAGNOSIS — R05 Cough: Secondary | ICD-10-CM | POA: Insufficient documentation

## 2014-06-25 DIAGNOSIS — Z79899 Other long term (current) drug therapy: Secondary | ICD-10-CM | POA: Insufficient documentation

## 2014-06-25 DIAGNOSIS — K219 Gastro-esophageal reflux disease without esophagitis: Secondary | ICD-10-CM | POA: Insufficient documentation

## 2014-06-25 DIAGNOSIS — I2510 Atherosclerotic heart disease of native coronary artery without angina pectoris: Secondary | ICD-10-CM | POA: Diagnosis not present

## 2014-06-25 DIAGNOSIS — I509 Heart failure, unspecified: Secondary | ICD-10-CM | POA: Diagnosis not present

## 2014-06-25 DIAGNOSIS — R059 Cough, unspecified: Secondary | ICD-10-CM

## 2014-06-25 DIAGNOSIS — Z7951 Long term (current) use of inhaled steroids: Secondary | ICD-10-CM | POA: Insufficient documentation

## 2014-06-25 DIAGNOSIS — Z862 Personal history of diseases of the blood and blood-forming organs and certain disorders involving the immune mechanism: Secondary | ICD-10-CM | POA: Insufficient documentation

## 2014-06-25 DIAGNOSIS — M1990 Unspecified osteoarthritis, unspecified site: Secondary | ICD-10-CM | POA: Insufficient documentation

## 2014-06-25 DIAGNOSIS — Z87891 Personal history of nicotine dependence: Secondary | ICD-10-CM | POA: Insufficient documentation

## 2014-06-25 DIAGNOSIS — J45901 Unspecified asthma with (acute) exacerbation: Secondary | ICD-10-CM | POA: Diagnosis not present

## 2014-06-25 DIAGNOSIS — Z9889 Other specified postprocedural states: Secondary | ICD-10-CM | POA: Insufficient documentation

## 2014-06-25 LAB — CBC WITH DIFFERENTIAL/PLATELET
Basophils Absolute: 0.1 10*3/uL (ref 0.0–0.1)
Basophils Relative: 0 % (ref 0–1)
Eosinophils Absolute: 0.1 10*3/uL (ref 0.0–0.7)
Eosinophils Relative: 1 % (ref 0–5)
HCT: 48.9 % — ABNORMAL HIGH (ref 36.0–46.0)
Hemoglobin: 15.4 g/dL — ABNORMAL HIGH (ref 12.0–15.0)
Lymphocytes Relative: 20 % (ref 12–46)
Lymphs Abs: 2.3 10*3/uL (ref 0.7–4.0)
MCH: 27.9 pg (ref 26.0–34.0)
MCHC: 31.5 g/dL (ref 30.0–36.0)
MCV: 88.7 fL (ref 78.0–100.0)
Monocytes Absolute: 1 10*3/uL (ref 0.1–1.0)
Monocytes Relative: 9 % (ref 3–12)
Neutro Abs: 7.9 10*3/uL — ABNORMAL HIGH (ref 1.7–7.7)
Neutrophils Relative %: 70 % (ref 43–77)
Platelets: 184 10*3/uL (ref 150–400)
RBC: 5.51 MIL/uL — ABNORMAL HIGH (ref 3.87–5.11)
RDW: 14.5 % (ref 11.5–15.5)
WBC: 11.5 10*3/uL — ABNORMAL HIGH (ref 4.0–10.5)

## 2014-06-25 LAB — COMPREHENSIVE METABOLIC PANEL
ALT: 42 U/L (ref 14–54)
AST: 35 U/L (ref 15–41)
Albumin: 3.6 g/dL (ref 3.5–5.0)
Alkaline Phosphatase: 103 U/L (ref 38–126)
Anion gap: 9 (ref 5–15)
BUN: 30 mg/dL — ABNORMAL HIGH (ref 6–20)
CO2: 29 mmol/L (ref 22–32)
Calcium: 9.4 mg/dL (ref 8.9–10.3)
Chloride: 100 mmol/L — ABNORMAL LOW (ref 101–111)
Creatinine, Ser: 1.23 mg/dL — ABNORMAL HIGH (ref 0.44–1.00)
GFR calc Af Amer: 50 mL/min — ABNORMAL LOW (ref >60)
GFR calc non Af Amer: 43 mL/min — ABNORMAL LOW (ref >60)
Glucose, Bld: 126 mg/dL — ABNORMAL HIGH (ref 65–99)
Potassium: 4.9 mmol/L (ref 3.5–5.1)
Sodium: 138 mmol/L (ref 135–145)
Total Bilirubin: 1 mg/dL (ref 0.3–1.2)
Total Protein: 7.4 g/dL (ref 6.5–8.1)

## 2014-06-25 LAB — URINALYSIS, ROUTINE W REFLEX MICROSCOPIC
Bilirubin Urine: NEGATIVE
Glucose, UA: NEGATIVE mg/dL
Hgb urine dipstick: NEGATIVE
Ketones, ur: NEGATIVE mg/dL
Leukocytes, UA: NEGATIVE
Nitrite: NEGATIVE
Protein, ur: NEGATIVE mg/dL
Specific Gravity, Urine: 1.01 (ref 1.005–1.030)
Urobilinogen, UA: 0.2 mg/dL (ref 0.0–1.0)
pH: 6 (ref 5.0–8.0)

## 2014-06-25 LAB — LIPASE, BLOOD: Lipase: 44 U/L (ref 22–51)

## 2014-06-25 MED ORDER — IOHEXOL 300 MG/ML  SOLN
50.0000 mL | Freq: Once | INTRAMUSCULAR | Status: AC | PRN
  Administered 2014-06-25: 50 mL via ORAL

## 2014-06-25 MED ORDER — DOCUSATE SODIUM 100 MG PO CAPS: 100.0000 mg | ORAL_CAPSULE | Freq: Every day | ORAL | Status: DC

## 2014-06-25 MED ORDER — ONDANSETRON HCL 4 MG/2ML IJ SOLN
4.0000 mg | Freq: Once | INTRAMUSCULAR | Status: AC
  Administered 2014-06-25: 4 mg via INTRAVENOUS
  Filled 2014-06-25: qty 2

## 2014-06-25 MED ORDER — OXYCODONE-ACETAMINOPHEN 5-325 MG PO TABS: 1.0000 | ORAL_TABLET | Freq: Four times a day (QID) | ORAL | Status: DC | PRN

## 2014-06-25 MED ORDER — HYDROMORPHONE HCL 1 MG/ML IJ SOLN
0.5000 mg | Freq: Once | INTRAMUSCULAR | Status: AC
  Administered 2014-06-25: 0.5 mg via INTRAVENOUS
  Filled 2014-06-25: qty 1

## 2014-06-25 NOTE — ED Notes (Signed)
Awake. Verbally responsive. A/O x4. Resp even and unlabored. No audible adventitious breath sounds noted. ABC's intact.  

## 2014-06-25 NOTE — ED Notes (Signed)
Awake. Verbally responsive. A/O x4. Resp even and unlabored. No audible adventitious breath sounds noted. ABC's intact. SR on monitor. IV saline lock patent and intact. Family at bedside. 

## 2014-06-25 NOTE — ED Notes (Addendum)
Resting quietly with eyes closed. Easily arousable. Verbally responsive. Resp even and unlabored. No audible adventitious breath sounds noted. ABC's intact. IV saline lock patent and intact. Family at bedside.

## 2014-06-25 NOTE — ED Notes (Signed)
Patient transported to CT and returned to room without distress noted. 

## 2014-06-25 NOTE — ED Notes (Addendum)
Pt reported having pain to lower back radiates to lower abd. Denies dysuria. Reported urinary frequency/urgency with foul odor since taking ABT. Also has pain to upper back. Auscultated clear breath sounds bil and throughout. Resp even and unlabored. Pt reported productive cough to clear sputum.

## 2014-06-25 NOTE — ED Notes (Signed)
Patient transported to X-ray 

## 2014-06-25 NOTE — ED Provider Notes (Signed)
CSN: [id number]     Arrival date & time 06/25/14  [id number] History   First MD Initiated Contact with Patient 06/25/14 0710     Chief Complaint  Patient presents with  . Flank Pain     (Consider location/radiation/quality/duration/timing/severity/associated sxs/prior Treatment) Patient is a 69 y.o. female presenting with flank pain. The history is provided by the patient.  Flank Pain This is a new problem. The current episode started more than 2 days ago. Episode frequency: intermittent. The problem has been gradually worsening. Associated symptoms include abdominal pain and shortness of breath (occasionally). Pertinent negatives include no chest pain and no headaches. Nothing aggravates the symptoms. Nothing relieves the symptoms. She has tried nothing for the symptoms. The treatment provided no relief.    Past Medical History  Diagnosis Date  . Asthma   . Hypertension   . Arthritis   . GERD (gastroesophageal reflux disease)   . Diverticulosis   . Anemia   . CAD (coronary artery disease)   . Fatty liver disease, nonalcoholic   . Internal hemorrhoids   . Gastritis   . Hyperplastic colon polyp   . Sleep apnea   . ICD (implantable cardiac defibrillator) in place   . Shortness of breath   . IBS (irritable bowel syndrome)   . Anxiety   . CHF (congestive heart failure)   . H/O hiatal hernia   . Obesity   . Back pain   . Diabetes mellitus without complication    Past Surgical History  Procedure Laterality Date  . Pacemaker insertion  2001    BS BivICD  . Cardiac defibrillator placement    . Appendectomy    . Knee surgery      right  . Ankle surgery      right  . Tubal ligation    . Cervical spine surgery      plate   . Coronary angiogram  2010  . Abdominal ultrasound  12/01/2011    Peripelvic cysts- #1- 2.4x1.9x2.3cm, #2-1.2x0.9x1.2cm  . Cardiac catheterization  05/17/1999    No significant coronary obstructive disease w/ mild 20% luminal irregularity of the first diag  branch of the LAD  . Cardiac catheterization  07/08/2002    No significant CAD, moderately depressed LV systolic function  . Cardiac catheterization Bilateral 04/26/2007    Normal findings, recommend medical therapy  . Cardiac catheterization  02/18/2008    Moderate CAD, would benefit from having a functional study, recommend continue medical therapy  . Cardiac catheterization  07/23/2012    Medical therapy  . Lexiscan myoview  11/14/2011    Mild-moderate defect seen in Mid Inferolateral and Mid Anterolateral regions-consistant w/ infarct/scar. No significant ischemia demonstrated.  . Transthoracic echocardiogram  07/23/2012    EF 55-60%, normal-mild  . Left heart catheterization with coronary angiogram N/A 07/23/2012    Procedure: LEFT HEART CATHETERIZATION WITH CORONARY ANGIOGRAM;  Surgeon: Leonie Man, MD;  Location: [facility] CATH LAB;  Service: Cardiovascular;  Laterality: N/A;  . Biv pacemaker generator change out N/A 11/02/2012    Procedure: BIV PACEMAKER GENERATOR CHANGE OUT;  Surgeon: Sanda Klein, MD;  Location: [facility] CATH LAB;  Service: Cardiovascular;  Laterality: N/A;   Family History  Problem Relation Age of Onset  . Breast cancer Mother   . Diabetes Mother   . Heart disease Maternal Grandmother   . Kidney disease Maternal Grandmother   . Diabetes Maternal Grandmother   . Glaucoma Maternal Aunt   . Heart disease Maternal Aunt   . Asthma  Sister    History  Substance Use Topics  . Smoking status: Former Smoker -- 0.25 packs/day for 3 years    Types: Cigarettes    Quit date: 02/11/1968  . Smokeless tobacco: Never Used  . Alcohol Use: No   OB History    No data available     Review of Systems  Constitutional: Negative for fever and fatigue.  HENT: Negative for congestion and drooling.   Eyes: Negative for pain.  Respiratory: Positive for cough and shortness of breath (occasionally).   Cardiovascular: Negative for chest pain.  Gastrointestinal: Positive for abdominal pain.  Negative for nausea, vomiting and diarrhea.  Genitourinary: Positive for dysuria, frequency and flank pain. Negative for hematuria.  Musculoskeletal: Negative for back pain, gait problem and neck pain.  Skin: Negative for color change.  Neurological: Negative for dizziness and headaches.  Hematological: Negative for adenopathy.  Psychiatric/Behavioral: Negative for behavioral problems.  All other systems reviewed and are negative.     Allergies  Ivp dye; Shellfish-derived products; Sulfa antibiotics; Iodine; and Zithromax  Home Medications   Prior to Admission medications   Medication Sig Start Date End Date Taking? Authorizing Provider  albuterol (PROVENTIL HFA;VENTOLIN HFA) 108 (90 BASE) MCG/ACT inhaler Inhale 2 puffs into the lungs every 4 (four) hours as needed for wheezing.     Historical Provider, MD  amLODipine (NORVASC) 10 MG tablet Take 10 mg by mouth daily.    Historical Provider, MD  amLODipine (NORVASC) 10 MG tablet TAKE ONE TABLET BY MOUTH ONCE DAILY 05/31/14   Sanda Klein, MD  aspirin 81 MG chewable tablet Chew 81 mg by mouth daily.    Historical Provider, MD  BIOTIN PO Take 1 tablet by mouth daily.    Historical Provider, MD  bisoprolol (ZEBETA) 5 MG tablet Take 1 tablet (5 mg total) by mouth daily. 10/10/13   Tanda Rockers, MD  cetirizine (ZYRTEC) 10 MG tablet Take 10 mg by mouth daily.     Historical Provider, MD  clotrimazole (MYCELEX) 10 MG troche One every 4 hours as needed for mouth pain 09/08/13   Tanda Rockers, MD  fluticasone [facility]) 50 MCG/ACT nasal spray Place 1 spray into the nose 2 (two) times daily as needed for rhinitis or allergies.     Historical Provider, MD  furosemide (LASIX) 40 MG tablet Take 1 tablet (40 mg total) by mouth 2 (two) times daily. 11/04/13   [name], MD  isosorbide mononitrate (IMDUR) 30 MG 24 hr tablet Take 1 tablet (30 mg total) by mouth daily. 09/01/13   Liliane Shi, PA-C  losartan (COZAAR) 100 MG tablet Take 1 tablet (100  mg total) by mouth daily. 08/15/13   [name], MD  metolazone (ZAROXOLYN) 2.5 MG tablet Take 2.5 mg by mouth 2 (two) times a week. Take one tablet on Tuesdays and Saturdays 30 minutes before the Furosemide.    Historical Provider, MD  nitroGLYCERIN (NITROSTAT) 0.4 MG SL tablet Place 0.4 mg under the tongue every 5 (five) minutes as needed for chest pain.    Historical Provider, MD  omeprazole (PRILOSEC) 40 MG capsule Take 1 capsule by mouth daily. 07/19/13   Historical Provider, MD  potassium chloride SA (K-DUR,KLOR-CON) 20 MEQ tablet Take 1 tablet (20 mEq total) by mouth daily. 11/10/13   [name], MD  pravastatin (PRAVACHOL) 80 MG tablet Take 80 mg by mouth every evening.     Historical Provider, MD  spironolactone (ALDACTONE) 25 MG tablet TAKE ONE-HALF TABLET BY MOUTH TWICE  DAILY 09/09/13   Lorretta Harp, MD  SYMBICORT 160-4.5 MCG/ACT inhaler Inhale 2 puffs into the lungs 2 (two) times daily. 12/02/13   Historical Provider, MD   BP 133/79 mmHg  Pulse 70  Temp(Src) 98 F (36.7 C) (Oral)  Resp 18  Ht 5\' 3"  (1.6 m)  Wt 252 lb (114.306 kg)  BMI 44.65 kg/m2  SpO2 91% Physical Exam  Constitutional: She is oriented to person, place, and time. She appears well-developed and well-nourished.  HENT:  Head: Normocephalic and atraumatic.  Mouth/Throat: Oropharynx is clear and moist. No oropharyngeal exudate.  Eyes: Conjunctivae and EOM are normal. Pupils are equal, round, and reactive to light.  Neck: Normal range of motion. Neck supple.  Cardiovascular: Normal rate, regular rhythm, normal heart sounds and intact distal pulses.  Exam reveals no gallop and no friction rub.   No murmur heard. Pulmonary/Chest: Effort normal and breath sounds normal. No respiratory distress. She has no wheezes.  Abdominal: Soft. Bowel sounds are normal. There is tenderness (mild tenderness to palpation of the left flank. No significant CVA tenderness bilaterally.). There is no rebound and no guarding.   Musculoskeletal: Normal range of motion. She exhibits no edema or tenderness.  Neurological: She is alert and oriented to person, place, and time.  Skin: Skin is warm and dry.  Normal appearance of the skin on the left flank.  Psychiatric: She has a normal mood and affect. Her behavior is normal.  Nursing note and vitals reviewed.   ED Course  Procedures (including critical care time) Labs Review Labs Reviewed  CBC WITH DIFFERENTIAL/PLATELET - Abnormal; Notable for the following:    WBC 11.5 (*)    RBC 5.51 (*)    Hemoglobin 15.4 (*)    HCT 48.9 (*)    Neutro Abs 7.9 (*)    All other components within normal limits  COMPREHENSIVE METABOLIC PANEL - Abnormal; Notable for the following:    Chloride 100 (*)    Glucose, Bld 126 (*)    BUN 30 (*)    Creatinine, Ser 1.23 (*)    GFR calc non Af Amer 43 (*)    GFR calc Af Amer 50 (*)    All other components within normal limits  URINE CULTURE  LIPASE, BLOOD  URINALYSIS, ROUTINE W REFLEX MICROSCOPIC    Imaging Review Ct Abdomen Pelvis Wo Contrast  06/25/2014   CLINICAL DATA:  Back and lower abdominal pain. Assess for diverticulitis.  EXAM: CT ABDOMEN AND PELVIS WITHOUT CONTRAST  TECHNIQUE: Multidetector CT imaging of the abdomen and pelvis was performed following the standard protocol without IV contrast.  COMPARISON:  January 10, 2014  FINDINGS: The liver, spleen, pancreas, gallbladder, adrenal glands are normal. There are bilateral parapelvic renal cysts. There is no nephrolithiasis bilaterally. There is no hydronephrosis bilaterally. Stable small cortical renal cysts are unchanged. There is atherosclerosis of the abdominal aorta without aneurysmal dilatation. There is no abdominal lymphadenopathy.  There is diverticulosis of colon without diverticulitis. The appendix has been surgically removed. There is no small bowel obstruction. There is suggestion of bowel wall thickening at the region of pylorus and first portion of duodenum.   Fluid-filled bladder is normal. The uterus is normal. The visualized lung bases are clear. Degenerative joint changes of the spine are identified.  IMPRESSION: Diverticulosis without diverticulitis.  No small bowel obstruction.  Findings suggesting bowel wall thickening at the region of the pylorus and first portion the duodenum. This is nonspecific. Suggest further evaluation with upper GI series  on outpatient basis.   Electronically Signed   By: Abelardo Diesel M.D.   On: 06/25/2014 09:29   Dg Chest 2 View  06/25/2014   CLINICAL DATA:  Cough for 15 days.  EXAM: CHEST  2 VIEW  COMPARISON:  January 18, 2014  FINDINGS: The heart size and mediastinal contours are stable. Cardiac pacemaker is unchanged. The heart size is enlarged. There is mild scar of the left lung base unchanged. There is no focal infiltrate, pulmonary edema, or pleural effusion. The visualized skeletal structures are stable.  IMPRESSION: No active cardiopulmonary disease.   Electronically Signed   By: Abelardo Diesel M.D.   On: 06/25/2014 08:22     EKG Interpretation   Date/Time:  Sunday Jun 25 2014 07:48:09 EDT Ventricular Rate:  69 PR Interval:  187 QRS Duration: 156 QT Interval:  452 QTC Calculation: 484 R Axis:   -93 Text Interpretation:  Atrial-sensed ventricular-paced rhythm No further  analysis attempted due to paced rhythm Confirmed by Jiro Kiester  MD, Esaw Knippel  ([id number]) on 06/25/2014 7:53:53 AM      MDM   Final diagnoses:  Cough  Left flank pain  Cough  Left flank pain    7:32 AM 69 y.o. female w hx of HTN, CAD, s/p pacer/defib placement, IBS, CHF, DM, iodine allergy who presents with left flank pain for one week. She notes it is sharp and intermittent. She denies any fevers, vomiting, or diarrhea. She states that she occasionally feels short of breath and uses an inhaler but this is not unusual. She had a similar pain back in December 2015 and had a noncontributory CT scan without contrast of her abdomen. She states  that her pain spontaneously resolved. This episode seems like the pain is more intense but similar. Vital signs unremarkable here. She notes that she has had a cough productive of white sputum and was treated with a Z-Pak a few weeks ago but did not tolerate it due to a rash. She has ongoing cough. We'll get screening lab work and CT scan of abdomen with oral contrast. Also c/o dysuria/freq.   11:09 AM: I interpreted/reviewed the labs and/or imaging which were non-contributory.  Possibly msk. Recommended GI series as outpt based on CT abd. I have discussed the diagnosis/risks/treatment options with the patient and believe the pt to be eligible for discharge home to follow-up with her pcp in 2-3 days. We also discussed returning to the ED immediately if new or worsening sx occur. We discussed the sx which are most concerning (e.g., fever, worsening pain, vomiting) that necessitate immediate return. Medications administered to the patient during their visit and any new prescriptions provided to the patient are listed below.  Medications given during this visit Medications  HYDROmorphone (DILAUDID) injection 0.5 mg (0.5 mg Intravenous Given 06/25/14 0815)  iohexol (OMNIPAQUE) 300 MG/ML solution 50 mL (50 mLs Oral Contrast Given 06/25/14 0751)  ondansetron (ZOFRAN) injection 4 mg (4 mg Intravenous Given 06/25/14 0815)    New Prescriptions   DOCUSATE SODIUM (COLACE) 100 MG CAPSULE    Take 1 capsule (100 mg total) by mouth daily.   OXYCODONE-ACETAMINOPHEN (PERCOCET) 5-325 MG PER TABLET    Take 1 tablet by mouth every 6 (six) hours as needed.     Pamella Pert, MD 06/25/14 1110

## 2014-06-25 NOTE — ED Notes (Addendum)
Awake. Verbally responsive. A/O x4. Resp even and unlabored. No audible adventitious breath sounds noted. ABC's intact. SR on monitor. IV saline lock. Family at bedside. 

## 2014-06-25 NOTE — ED Notes (Signed)
Pt c/o L flank pain x 4-5 days, radiates to L abd. +dysuria. Denies n/v/d, denies fever. Pt is being treated for PNA.

## 2014-06-25 NOTE — Discharge Instructions (Signed)
CT scan of abdomen suggesting bowel wall thickening at the region of thepylorus and first portion the duodenum. This is nonspecific. Suggestfurther evaluation with upper GI series on outpatient basis. He may talk to your primary care doctor about further imaging. Abdominal Pain, Women Abdominal (stomach, pelvic, or belly) pain can be caused by many things. It is important to tell your doctor:  The location of the pain.  Does it come and go or is it present all the time?  Are there things that start the pain (eating certain foods, exercise)?  Are there other symptoms associated with the pain (fever, nausea, vomiting, diarrhea)? All of this is helpful to know when trying to find the cause of the pain. CAUSES   Stomach: virus or bacteria infection, or ulcer.  Intestine: appendicitis (inflamed appendix), regional ileitis (Crohn's disease), ulcerative colitis (inflamed colon), irritable bowel syndrome, diverticulitis (inflamed diverticulum of the colon), or cancer of the stomach or intestine.  Gallbladder disease or stones in the gallbladder.  Kidney disease, kidney stones, or infection.  Pancreas infection or cancer.  Fibromyalgia (pain disorder).  Diseases of the female organs:  Uterus: fibroid (non-cancerous) tumors or infection.  Fallopian tubes: infection or tubal pregnancy.  Ovary: cysts or tumors.  Pelvic adhesions (scar tissue).  Endometriosis (uterus lining tissue growing in the pelvis and on the pelvic organs).  Pelvic congestion syndrome (female organs filling up with blood just before the menstrual period).  Pain with the menstrual period.  Pain with ovulation (producing an egg).  Pain with an IUD (intrauterine device, birth control) in the uterus.  Cancer of the female organs.  Functional pain (pain not caused by a disease, may improve without treatment).  Psychological pain.  Depression. DIAGNOSIS  Your doctor will decide the seriousness of your pain by  doing an examination.  Blood tests.  X-rays.  Ultrasound.  CT scan (computed tomography, special type of X-ray).  MRI (magnetic resonance imaging).  Cultures, for infection.  Barium enema (dye inserted in the large intestine, to better view it with X-rays).  Colonoscopy (looking in intestine with a lighted tube).  Laparoscopy (minor surgery, looking in abdomen with a lighted tube).  Major abdominal exploratory surgery (looking in abdomen with a large incision). TREATMENT  The treatment will depend on the cause of the pain.   Many cases can be observed and treated at home.  Over-the-counter medicines recommended by your caregiver.  Prescription medicine.  Antibiotics, for infection.  Birth control pills, for painful periods or for ovulation pain.  Hormone treatment, for endometriosis.  Nerve blocking injections.  Physical therapy.  Antidepressants.  Counseling with a psychologist or psychiatrist.  Minor or major surgery. HOME CARE INSTRUCTIONS   Do not take laxatives, unless directed by your caregiver.  Take over-the-counter pain medicine only if ordered by your caregiver. Do not take aspirin because it can cause an upset stomach or bleeding.  Try a clear liquid diet (broth or water) as ordered by your caregiver. Slowly move to a bland diet, as tolerated, if the pain is related to the stomach or intestine.  Have a thermometer and take your temperature several times a day, and record it.  Bed rest and sleep, if it helps the pain.  Avoid sexual intercourse, if it causes pain.  Avoid stressful situations.  Keep your follow-up appointments and tests, as your caregiver orders.  If the pain does not go away with medicine or surgery, you may try:  Acupuncture.  Relaxation exercises (yoga, meditation).  Group therapy.  Counseling. SEEK MEDICAL CARE IF:   You notice certain foods cause stomach pain.  Your home care treatment is not helping your  pain.  You need stronger pain medicine.  You want your IUD removed.  You feel faint or lightheaded.  You develop nausea and vomiting.  You develop a rash.  You are having side effects or an allergy to your medicine. SEEK IMMEDIATE MEDICAL CARE IF:   Your pain does not go away or gets worse.  You have a fever.  Your pain is felt only in portions of the abdomen. The right side could possibly be appendicitis. The left lower portion of the abdomen could be colitis or diverticulitis.  You are passing blood in your stools (bright red or black tarry stools, with or without vomiting).  You have blood in your urine.  You develop chills, with or without a fever.  You pass out. MAKE SURE YOU:   Understand these instructions.  Will watch your condition.  Will get help right away if you are not doing well or get worse. Document Released: 11/24/2006 Document Revised: 06/13/2013 Document Reviewed: 12/14/2008 [facility] Patient Information 2015 [place], [place]. This information is not intended to replace advice given to you by your health care provider. Make sure you discuss any questions you have with your health care provider.

## 2014-06-26 LAB — URINE CULTURE: Colony Count: 2000

## 2014-07-04 ENCOUNTER — Ambulatory Visit (INDEPENDENT_AMBULATORY_CARE_PROVIDER_SITE_OTHER): Payer: Medicare Other | Admitting: Cardiovascular Disease

## 2014-07-04 ENCOUNTER — Encounter: Payer: Self-pay | Admitting: Cardiovascular Disease

## 2014-07-04 VITALS — BP 110/80 | HR 70 | Ht 63.0 in | Wt 248.9 lb

## 2014-07-04 DIAGNOSIS — I428 Other cardiomyopathies: Secondary | ICD-10-CM

## 2014-07-04 DIAGNOSIS — I10 Essential (primary) hypertension: Secondary | ICD-10-CM

## 2014-07-04 DIAGNOSIS — I5033 Acute on chronic diastolic (congestive) heart failure: Secondary | ICD-10-CM | POA: Diagnosis not present

## 2014-07-04 DIAGNOSIS — I2510 Atherosclerotic heart disease of native coronary artery without angina pectoris: Secondary | ICD-10-CM

## 2014-07-04 DIAGNOSIS — I429 Cardiomyopathy, unspecified: Secondary | ICD-10-CM

## 2014-07-04 DIAGNOSIS — I200 Unstable angina: Secondary | ICD-10-CM | POA: Diagnosis not present

## 2014-07-04 LAB — CUP PACEART INCLINIC DEVICE CHECK
Battery Remaining Longevity: 9
Brady Statistic RA Percent Paced: 22 %
Brady Statistic RV Percent Paced: 100 %
Date Time Interrogation Session: 20160524040000
Lead Channel Impedance Value: 1784 Ohm
Lead Channel Impedance Value: 638 Ohm
Lead Channel Impedance Value: 871 Ohm
Lead Channel Pacing Threshold Amplitude: 0.7 V
Lead Channel Pacing Threshold Amplitude: 0.7 V
Lead Channel Pacing Threshold Amplitude: 1.3 V
Lead Channel Pacing Threshold Pulse Width: 0.4 ms
Lead Channel Pacing Threshold Pulse Width: 0.4 ms
Lead Channel Pacing Threshold Pulse Width: 0.8 ms
Lead Channel Sensing Intrinsic Amplitude: 16.1 mV
Lead Channel Sensing Intrinsic Amplitude: 25 mV
Lead Channel Sensing Intrinsic Amplitude: 6.7 mV
Lead Channel Setting Pacing Amplitude: 2 V
Lead Channel Setting Pacing Amplitude: 2 V
Lead Channel Setting Pacing Amplitude: 2.4 V
Lead Channel Setting Pacing Pulse Width: 0.4 ms
Lead Channel Setting Pacing Pulse Width: 0.8 ms
Lead Channel Setting Sensing Sensitivity: 2.5 mV
Lead Channel Setting Sensing Sensitivity: 2.5 mV
Pulse Gen Serial Number: 100731
Zone Setting Detection Interval: 375 ms

## 2014-07-04 NOTE — Patient Instructions (Signed)
Remote monitoring is used to monitor your Pacemaker or ICD from home. This monitoring reduces the number of office visits required to check your device to one time per year. It allows [place] to monitor the functioning of your device to ensure it is working properly. You are scheduled for a device check from home on AUGUST 25,2016. You may send your transmission at any time that day. If you have a wireless device, the transmission will be sent automatically. After your physician reviews your transmission, you will receive a postcard with your next transmission date.  Dr. [name] recommends that you schedule a follow-up appointment in: 6 MONTHS.

## 2014-07-04 NOTE — Progress Notes (Signed)
Patient ID: [name], female   DOB: [birth year], 69 y.o.   MRN: [id number]     Cardiology Office Note   Date:  07/04/2014   ID:  [name], DOB 03-29-1945, MRN [id number]  PCP:  [name], MD  Cardiologist:   Sanda Klein, MD   Chief Complaint  Patient presents with  . Follow-up    chest pain but has had pneumonia and bronchitis,leg cramps at night      History of Present Illness: [name] is a 69 y.o. female who presents for biventricular pacemaker follow-up, chronic diastolic heart failure, nonischemic cardiomyopathy, morbid obesity.  Since her last appointment she feels greatly improved is much more active and wants to go to work 3 days a week. He is also thinking of engaging in water aerobics. She has had good response from her current diuretic regimen and her weight at home stays around 244 pounds. In the last month she had pneumonia treated with antibiotics to cause a skin reaction and then had the diverticulitis. Despite all this she feels her functional status is improved. Her mood is also clearly better.  Interrogation of her biventricular pacemaker (CRT-P, [employer]) shows normal function. She has 100% biventricular pacing and has not had any episodes of ventricular tachyarrhythmia. 3 episodes of supraventricular tachycardia are seen, possibly actually sinus tachycardia, slightly varying cycle length around an average of 460 ms. There is no evidence of atrial fibrillation. Battery longevity is estimated to be 9 years. There is 22% atrial pacing. Lead parameters are all excellent. Heart rate histogram distribution is normal.  She initially received a dual-chamber pacemaker in 2001 for second degree AV block. She then developed congestive heart failure. At one point left ventricular ejection fraction was severely depressed and her pacemaker was upgraded to a CRT-D device in 2008. She was a "hyperresponder" to CRT and EF returned to normal.  Sequentially, at the time of her generator change out in 2014 her biventricular defibrillator generator was changed out for a biventricular pacemaker [facility] dual-chamber).  She has minor coronary atherosclerosis with lesions between 20% and 50% scattered throughout all 3 coronary arteries but does not have a history of myocardial infarction. She had a low risk nuclear scan in October of 2013. She has obstructive sleep apnea but is not tolerant of CPAP. She has long-standing morbid obesity and it is difficult to distinguish pulmonary and cardiac causes of her dyspnea.   Past Medical History  Diagnosis Date  . Asthma   . Hypertension   . Arthritis   . GERD (gastroesophageal reflux disease)   . Diverticulosis   . Anemia   . CAD (coronary artery disease)   . Fatty liver disease, nonalcoholic   . Internal hemorrhoids   . Gastritis   . Hyperplastic colon polyp   . Sleep apnea   . ICD (implantable cardiac defibrillator) in place   . Shortness of breath   . IBS (irritable bowel syndrome)   . Anxiety   . CHF (congestive heart failure)   . H/O hiatal hernia   . Obesity   . Back pain   . Diabetes mellitus without complication     Past Surgical History  Procedure Laterality Date  . Pacemaker insertion  2001    BS BivICD  . Cardiac defibrillator placement    . Appendectomy    . Knee surgery      right  . Ankle surgery      right  . Tubal ligation    .  Cervical spine surgery      plate   . Coronary angiogram  2010  . Abdominal ultrasound  12/01/2011    Peripelvic cysts- #1- 2.4x1.9x2.3cm, #2-1.2x0.9x1.2cm  . Cardiac catheterization  05/17/1999    No significant coronary obstructive disease w/ mild 20% luminal irregularity of the first diag branch of the LAD  . Cardiac catheterization  07/08/2002    No significant CAD, moderately depressed LV systolic function  . Cardiac catheterization Bilateral 04/26/2007    Normal findings, recommend medical therapy  . Cardiac  catheterization  02/18/2008    Moderate CAD, would benefit from having a functional study, recommend continue medical therapy  . Cardiac catheterization  07/23/2012    Medical therapy  . Lexiscan myoview  11/14/2011    Mild-moderate defect seen in Mid Inferolateral and Mid Anterolateral regions-consistant w/ infarct/scar. No significant ischemia demonstrated.  . Transthoracic echocardiogram  07/23/2012    EF 55-60%, normal-mild  . Left heart catheterization with coronary angiogram N/A 07/23/2012    Procedure: LEFT HEART CATHETERIZATION WITH CORONARY ANGIOGRAM;  Surgeon: Leonie Man, MD;  Location: [facility] CATH LAB;  Service: Cardiovascular;  Laterality: N/A;  . Biv pacemaker generator change out N/A 11/02/2012    Procedure: BIV PACEMAKER GENERATOR CHANGE OUT;  Surgeon: Sanda Klein, MD;  Location: [facility] CATH LAB;  Service: Cardiovascular;  Laterality: N/A;     Current Outpatient Prescriptions  Medication Sig Dispense Refill  . albuterol (PROVENTIL HFA;VENTOLIN HFA) 108 (90 BASE) MCG/ACT inhaler Inhale 2 puffs into the lungs every 4 (four) hours as needed for wheezing.     [name] amLODipine (NORVASC) 10 MG tablet TAKE ONE TABLET BY MOUTH ONCE DAILY 30 tablet 6  . aspirin 81 MG chewable tablet Chew 81 mg by mouth daily.    [name] BIOTIN PO Take 1 tablet by mouth daily.    . bisoprolol (ZEBETA) 5 MG tablet Take 1 tablet (5 mg total) by mouth daily. 30 tablet 11  . cetirizine (ZYRTEC) 10 MG tablet Take 10 mg by mouth daily.     . clotrimazole (MYCELEX) 10 MG troche One every 4 hours as needed for mouth pain (Patient taking differently: Take 10 mg by mouth every 4 (four) hours as needed (for mouth pain). ) 12 tablet 11  . docusate sodium (COLACE) 100 MG capsule Take 1 capsule (100 mg total) by mouth daily. 30 capsule 0  . fluticasone (FLONASE) 50 MCG/ACT nasal spray Place 1 spray into the nose 2 (two) times daily as needed for rhinitis or allergies.     . furosemide (LASIX) 40 MG tablet Take 1 tablet (40 mg total)  by mouth 2 (two) times daily. 60 tablet 5  . isosorbide mononitrate (IMDUR) 30 MG 24 hr tablet Take 1 tablet (30 mg total) by mouth daily. 30 tablet 11  . losartan (COZAAR) 100 MG tablet Take 1 tablet (100 mg total) by mouth daily. 90 tablet 2  . metolazone (ZAROXOLYN) 2.5 MG tablet Take 2.5 mg by mouth 2 (two) times a week. Take one tablet on Tuesdays and Saturdays 30 minutes before the Furosemide.    . nitroGLYCERIN (NITROSTAT) 0.4 MG SL tablet Place 0.4 mg under the tongue every 5 (five) minutes as needed for chest pain.    [name] omeprazole (PRILOSEC) 40 MG capsule Take 40 mg by mouth daily.     [name] oxyCODONE-acetaminophen (PERCOCET) 5-325 MG per tablet Take 1 tablet by mouth every 6 (six) hours as needed. 20 tablet 0  . potassium chloride SA (K-DUR,KLOR-CON) 20 MEQ tablet  Take 1 tablet (20 mEq total) by mouth daily. (Patient taking differently: Take 20 mEq by mouth 2 (two) times daily. ) 60 tablet 11  . pravastatin (PRAVACHOL) 80 MG tablet Take 80 mg by mouth every evening.     [name] spironolactone (ALDACTONE) 25 MG tablet TAKE ONE-HALF TABLET BY MOUTH TWICE DAILY 30 tablet 11  . SYMBICORT 160-4.5 MCG/ACT inhaler Inhale 2 puffs into the lungs 2 (two) times daily.     No current facility-administered medications for this visit.    Allergies:   Ivp dye; Shellfish-derived products; Sulfa antibiotics; Iodine; and Zithromax    Social History:  The patient  reports that she quit smoking about 46 years ago. Her smoking use included Cigarettes. She has a .75 pack-year smoking history. She has never used smokeless tobacco. She reports that she does not drink alcohol or use illicit drugs.   Family History:  The patient's family history includes Asthma in her sister; Breast cancer in her mother; Diabetes in her maternal grandmother and mother; Glaucoma in her maternal aunt; Heart disease in her maternal aunt and maternal grandmother; Kidney disease in her maternal grandmother.    ROS:  Please see the history  of present illness.    Otherwise, review of systems positive for occasional wheezing, rare ankle edema, very rare dizziness.   All other systems are reviewed and negative.    PHYSICAL EXAM: VS:  BP 110/80 mmHg  Pulse 70  Ht 5\' 3"  (1.6 m)  Wt 112.9 kg (248 lb 14.4 oz)  BMI 44.10 kg/m2 , BMI Body mass index is 44.1 kg/(m^2).  General: Alert, oriented x3, no distress Head: no evidence of trauma, PERRL, EOMI, no exophtalmos or lid lag, no myxedema, no xanthelasma; normal ears, nose and oropharynx Neck: normal jugular venous pulsations and no hepatojugular reflux; brisk carotid pulses without delay and no carotid bruits Chest: clear to auscultation, no signs of consolidation by percussion or palpation, normal fremitus, symmetrical and full respiratory excursions Cardiovascular: normal position and quality of the apical impulse, regular rhythm, normal first and second heart sounds, no murmurs, rubs or gallops Abdomen: no tenderness or distention, no masses by palpation, no abnormal pulsatility or arterial bruits, normal bowel sounds, no hepatosplenomegaly Extremities: no clubbing, cyanosis or edema; 2+ radial, ulnar and brachial pulses bilaterally; 2+ right femoral, posterior tibial and dorsalis pedis pulses; 2+ left femoral, posterior tibial and dorsalis pedis pulses; no subclavian or femoral bruits Neurological: grossly nonfocal Psych: euthymic mood, full affect   EKG:  EKG is not ordered today.   Recent Labs: 09/01/2013: Pro B Natriuretic peptide (BNP) 66.0 09/07/2013: TSH 2.062 06/25/2014: ALT 42; BUN 30*; Creatinine 1.23*; Hemoglobin 15.4*; Platelets 184; Potassium 4.9; Sodium 138    Lipid Panel    Component Value Date/Time   CHOL 234* 11/16/2013 0938   TRIG 187* 11/16/2013 0938   HDL 49 11/16/2013 0938   CHOLHDL 4.8 11/16/2013 0938   VLDL 37 11/16/2013 0938   LDLCALC 148* 11/16/2013 0938      Wt Readings from Last 3 Encounters:  07/04/14 112.9 kg (248 lb 14.4 oz)  06/25/14  114.306 kg (252 lb)  04/04/14 115.486 kg (254 lb 9.6 oz)      ASSESSMENT AND PLAN:  1. Compensated chronic diastolic heart failure. Assessment of volume status is always a challenge due to her morbid obesity but she seems to be maintaining a state he waits on the current diuretic regimen and her functional status has improved  2. Second-degree AV block status post CRT-P  with normal device function. After changing to a biventricular device for left ventricular systolic function normalized.  3. Minor CAD, asymptomatic, low risk nuclear scan within the last 3 years.  4. Obstructive sleep apnea     Current medicines are reviewed at length with the patient today.  The patient does not have concerns regarding medicines.  The following changes have been made:  no change  Labs/ tests ordered today include:  No orders of the defined types were placed in this encounter.    Patient Instructions  Remote monitoring is used to monitor your Pacemaker or ICD from home. This monitoring reduces the number of office visits required to check your device to one time per year. It allows [place] to monitor the functioning of your device to ensure it is working properly. You are scheduled for a device check from home on AUGUST 25,2016. You may send your transmission at any time that day. If you have a wireless device, the transmission will be sent automatically. After your physician reviews your transmission, you will receive a postcard with your next transmission date.  Dr. [name] recommends that you schedule a follow-up appointment in: 6 MONTHS.       Mikael Spray, MD  07/04/2014 10:34 AM    Sanda Klein, MD, [facility] [phone] office [phone] pager

## 2014-07-05 ENCOUNTER — Telehealth: Payer: Self-pay | Admitting: Internal Medicine

## 2014-07-05 NOTE — Telephone Encounter (Signed)
Patient scheduled with Tye Savoy, NP on 07/12/14 at 2:00 PM. Dr. Inda Merlin office to fax records.

## 2014-07-07 ENCOUNTER — Encounter (HOSPITAL_COMMUNITY): Payer: Self-pay | Admitting: Emergency Medicine

## 2014-07-07 ENCOUNTER — Emergency Department (HOSPITAL_COMMUNITY)
Admission: EM | Admit: 2014-07-07 | Discharge: 2014-07-08 | Disposition: A | Discharge status: Home / Self Care | Payer: Medicare Other | Attending: Emergency Medicine | Admitting: Emergency Medicine

## 2014-07-07 DIAGNOSIS — N390 Urinary tract infection, site not specified: Secondary | ICD-10-CM | POA: Insufficient documentation

## 2014-07-07 DIAGNOSIS — Z7951 Long term (current) use of inhaled steroids: Secondary | ICD-10-CM | POA: Diagnosis not present

## 2014-07-07 DIAGNOSIS — Z8659 Personal history of other mental and behavioral disorders: Secondary | ICD-10-CM | POA: Diagnosis not present

## 2014-07-07 DIAGNOSIS — R197 Diarrhea, unspecified: Secondary | ICD-10-CM | POA: Diagnosis not present

## 2014-07-07 DIAGNOSIS — I509 Heart failure, unspecified: Secondary | ICD-10-CM | POA: Diagnosis not present

## 2014-07-07 DIAGNOSIS — Z862 Personal history of diseases of the blood and blood-forming organs and certain disorders involving the immune mechanism: Secondary | ICD-10-CM | POA: Insufficient documentation

## 2014-07-07 DIAGNOSIS — K219 Gastro-esophageal reflux disease without esophagitis: Secondary | ICD-10-CM | POA: Insufficient documentation

## 2014-07-07 DIAGNOSIS — Z79899 Other long term (current) drug therapy: Secondary | ICD-10-CM | POA: Insufficient documentation

## 2014-07-07 DIAGNOSIS — Z8669 Personal history of other diseases of the nervous system and sense organs: Secondary | ICD-10-CM | POA: Insufficient documentation

## 2014-07-07 DIAGNOSIS — E119 Type 2 diabetes mellitus without complications: Secondary | ICD-10-CM | POA: Diagnosis not present

## 2014-07-07 DIAGNOSIS — R109 Unspecified abdominal pain: Secondary | ICD-10-CM | POA: Diagnosis present

## 2014-07-07 DIAGNOSIS — Z7982 Long term (current) use of aspirin: Secondary | ICD-10-CM | POA: Diagnosis not present

## 2014-07-07 DIAGNOSIS — R112 Nausea with vomiting, unspecified: Secondary | ICD-10-CM | POA: Diagnosis not present

## 2014-07-07 DIAGNOSIS — E669 Obesity, unspecified: Secondary | ICD-10-CM | POA: Diagnosis not present

## 2014-07-07 DIAGNOSIS — J45909 Unspecified asthma, uncomplicated: Secondary | ICD-10-CM | POA: Insufficient documentation

## 2014-07-07 DIAGNOSIS — I10 Essential (primary) hypertension: Secondary | ICD-10-CM | POA: Diagnosis not present

## 2014-07-07 DIAGNOSIS — Z87891 Personal history of nicotine dependence: Secondary | ICD-10-CM | POA: Insufficient documentation

## 2014-07-07 DIAGNOSIS — K2970 Gastritis, unspecified, without bleeding: Secondary | ICD-10-CM | POA: Diagnosis not present

## 2014-07-07 DIAGNOSIS — Z9889 Other specified postprocedural states: Secondary | ICD-10-CM | POA: Diagnosis not present

## 2014-07-07 DIAGNOSIS — M1990 Unspecified osteoarthritis, unspecified site: Secondary | ICD-10-CM | POA: Insufficient documentation

## 2014-07-07 DIAGNOSIS — Z95810 Presence of automatic (implantable) cardiac defibrillator: Secondary | ICD-10-CM | POA: Insufficient documentation

## 2014-07-07 DIAGNOSIS — Z86010 Personal history of colonic polyps: Secondary | ICD-10-CM | POA: Diagnosis not present

## 2014-07-07 DIAGNOSIS — I2510 Atherosclerotic heart disease of native coronary artery without angina pectoris: Secondary | ICD-10-CM | POA: Insufficient documentation

## 2014-07-07 DIAGNOSIS — R1084 Generalized abdominal pain: Secondary | ICD-10-CM

## 2014-07-07 LAB — COMPREHENSIVE METABOLIC PANEL
ALT: 27 U/L (ref 14–54)
AST: 25 U/L (ref 15–41)
Albumin: 3.6 g/dL (ref 3.5–5.0)
Alkaline Phosphatase: 81 U/L (ref 38–126)
Anion gap: 12 (ref 5–15)
BUN: 43 mg/dL — ABNORMAL HIGH (ref 6–20)
CO2: 21 mmol/L — ABNORMAL LOW (ref 22–32)
Calcium: 9 mg/dL (ref 8.9–10.3)
Chloride: 101 mmol/L (ref 101–111)
Creatinine, Ser: 1.6 mg/dL — ABNORMAL HIGH (ref 0.44–1.00)
GFR calc Af Amer: 37 mL/min — ABNORMAL LOW (ref >60)
GFR calc non Af Amer: 32 mL/min — ABNORMAL LOW (ref >60)
Glucose, Bld: 161 mg/dL — ABNORMAL HIGH (ref 65–99)
Potassium: 4.1 mmol/L (ref 3.5–5.1)
Sodium: 134 mmol/L — ABNORMAL LOW (ref 135–145)
Total Bilirubin: 1.2 mg/dL (ref 0.3–1.2)
Total Protein: 7.1 g/dL (ref 6.5–8.1)

## 2014-07-07 LAB — CBC WITH DIFFERENTIAL/PLATELET
Basophils Absolute: 0 10*3/uL (ref 0.0–0.1)
Basophils Relative: 0 % (ref 0–1)
Eosinophils Absolute: 0.1 10*3/uL (ref 0.0–0.7)
Eosinophils Relative: 1 % (ref 0–5)
HCT: 48.8 % — ABNORMAL HIGH (ref 36.0–46.0)
Hemoglobin: 15.8 g/dL — ABNORMAL HIGH (ref 12.0–15.0)
Lymphocytes Relative: 8 % — ABNORMAL LOW (ref 12–46)
Lymphs Abs: 1.2 10*3/uL (ref 0.7–4.0)
MCH: 28.1 pg (ref 26.0–34.0)
MCHC: 32.4 g/dL (ref 30.0–36.0)
MCV: 86.8 fL (ref 78.0–100.0)
Monocytes Absolute: 0.9 10*3/uL (ref 0.1–1.0)
Monocytes Relative: 6 % (ref 3–12)
Neutro Abs: 11.9 10*3/uL — ABNORMAL HIGH (ref 1.7–7.7)
Neutrophils Relative %: 85 % — ABNORMAL HIGH (ref 43–77)
Platelets: 180 10*3/uL (ref 150–400)
RBC: 5.62 MIL/uL — ABNORMAL HIGH (ref 3.87–5.11)
RDW: 14.7 % (ref 11.5–15.5)
WBC: 14 10*3/uL — ABNORMAL HIGH (ref 4.0–10.5)

## 2014-07-07 LAB — URINALYSIS, ROUTINE W REFLEX MICROSCOPIC
Glucose, UA: NEGATIVE mg/dL
Ketones, ur: NEGATIVE mg/dL
Nitrite: NEGATIVE
Protein, ur: >300 mg/dL — AB
Specific Gravity, Urine: 1.025 (ref 1.005–1.030)
Urobilinogen, UA: 0.2 mg/dL (ref 0.0–1.0)
pH: 5.5 (ref 5.0–8.0)

## 2014-07-07 LAB — URINE MICROSCOPIC-ADD ON

## 2014-07-07 LAB — LIPASE, BLOOD: Lipase: 30 U/L (ref 22–51)

## 2014-07-07 MED ORDER — ONDANSETRON HCL 8 MG PO TABS: 8.0000 mg | ORAL_TABLET | Freq: Three times a day (TID) | ORAL | Status: DC | PRN

## 2014-07-07 MED ORDER — ONDANSETRON HCL 4 MG/2ML IJ SOLN
4.0000 mg | Freq: Once | INTRAMUSCULAR | Status: AC
  Administered 2014-07-07: 4 mg via INTRAVENOUS
  Filled 2014-07-07: qty 2

## 2014-07-07 MED ORDER — SODIUM CHLORIDE 0.9 % IV BOLUS (SEPSIS)
1000.0000 mL | INTRAVENOUS | Status: AC
  Administered 2014-07-07: 1000 mL via INTRAVENOUS

## 2014-07-07 MED ORDER — HYDROMORPHONE HCL 1 MG/ML IJ SOLN
1.0000 mg | INTRAMUSCULAR | Status: DC | PRN
  Administered 2014-07-07: 1 mg via INTRAVENOUS
  Filled 2014-07-07: qty 1

## 2014-07-07 MED ORDER — SODIUM CHLORIDE 0.9 % IV BOLUS (SEPSIS)
1000.0000 mL | Freq: Once | INTRAVENOUS | Status: AC
  Administered 2014-07-07: 1000 mL via INTRAVENOUS

## 2014-07-07 MED ORDER — DEXTROSE 5 % IV SOLN
1.0000 g | Freq: Once | INTRAVENOUS | Status: AC
  Administered 2014-07-07: 1 g via INTRAVENOUS
  Filled 2014-07-07: qty 10

## 2014-07-07 MED ORDER — CEPHALEXIN 500 MG PO CAPS: 500.0000 mg | ORAL_CAPSULE | Freq: Four times a day (QID) | ORAL | Status: DC

## 2014-07-07 NOTE — ED Notes (Signed)
MD Wentz at bedside.  

## 2014-07-07 NOTE — ED Notes (Addendum)
Pt reports she feels as if something is in her throat SPO2 87% RA. Pt placed on 2 L [place]. BP 92/55. MD Eulis Foster made aware.

## 2014-07-07 NOTE — ED Notes (Signed)
Crackers and ginger ale provided.

## 2014-07-07 NOTE — ED Notes (Addendum)
Pt c/o left chest pain. EKG performed. MD Eulis Foster made aware Pt Ventricular paced rate 103. Pt requesting to use her albuteral inhaler MD Eulis Foster agrees. No wheezing noted, ALL lung field's clear.

## 2014-07-07 NOTE — ED Provider Notes (Signed)
CSN: [id number]     Arrival date & time 07/07/14  1951 History   First MD Initiated Contact with Patient 07/07/14 2004     Chief Complaint  Patient presents with  . Abdominal Pain  . Emesis  . Diarrhea     (Consider location/radiation/quality/duration/timing/severity/associated sxs/prior Treatment) HPI   [name] is a 69 y.o. female who presents for evaluation of abdominal pain, vomiting, diarrhea. Symptom onset 2 days ago. Symptoms associated with fever to 102 yesterday. No blood in emesis or diarrhea. Similar episode 12 days ago when she was evaluated here with CT imaging. She is referred to gastroenterology and has a follow-up appointment this week to evaluate thickening in the region of the pylorus and the first portion of the small intestine. No history of abdominal surgery. Positive sick contacts at home with abdominal pain earlier in the week. She denies chest pain, shortness of breath. No weakness or dizziness. There are no other known modifying factors.   Past Medical History  Diagnosis Date  . Asthma   . Hypertension   . Arthritis   . GERD (gastroesophageal reflux disease)   . Diverticulosis   . Anemia   . CAD (coronary artery disease)   . Fatty liver disease, nonalcoholic   . Internal hemorrhoids   . Gastritis   . Hyperplastic colon polyp   . Sleep apnea   . ICD (implantable cardiac defibrillator) in place   . Shortness of breath   . IBS (irritable bowel syndrome)   . Anxiety   . CHF (congestive heart failure)   . H/O hiatal hernia   . Obesity   . Back pain   . Diabetes mellitus without complication    Past Surgical History  Procedure Laterality Date  . Pacemaker insertion  2001    BS BivICD  . Cardiac defibrillator placement    . Appendectomy    . Knee surgery      right  . Ankle surgery      right  . Tubal ligation    . Cervical spine surgery      plate   . Coronary angiogram  2010  . Abdominal ultrasound  12/01/2011    Peripelvic cysts-  #1- 2.4x1.9x2.3cm, #2-1.2x0.9x1.2cm  . Cardiac catheterization  05/17/1999    No significant coronary obstructive disease w/ mild 20% luminal irregularity of the first diag branch of the LAD  . Cardiac catheterization  07/08/2002    No significant CAD, moderately depressed LV systolic function  . Cardiac catheterization Bilateral 04/26/2007    Normal findings, recommend medical therapy  . Cardiac catheterization  02/18/2008    Moderate CAD, would benefit from having a functional study, recommend continue medical therapy  . Cardiac catheterization  07/23/2012    Medical therapy  . Lexiscan myoview  11/14/2011    Mild-moderate defect seen in Mid Inferolateral and Mid Anterolateral regions-consistant w/ infarct/scar. No significant ischemia demonstrated.  . Transthoracic echocardiogram  07/23/2012    EF 55-60%, normal-mild  . Left heart catheterization with coronary angiogram N/A 07/23/2012    Procedure: LEFT HEART CATHETERIZATION WITH CORONARY ANGIOGRAM;  Surgeon: Leonie Man, MD;  Location: [facility] CATH LAB;  Service: Cardiovascular;  Laterality: N/A;  . Biv pacemaker generator change out N/A 11/02/2012    Procedure: BIV PACEMAKER GENERATOR CHANGE OUT;  Surgeon: Sanda Klein, MD;  Location: [facility] CATH LAB;  Service: Cardiovascular;  Laterality: N/A;   Family History  Problem Relation Age of Onset  . Breast cancer Mother   . Diabetes  Mother   . Heart disease Maternal Grandmother   . Kidney disease Maternal Grandmother   . Diabetes Maternal Grandmother   . Glaucoma Maternal Aunt   . Heart disease Maternal Aunt   . Asthma Sister    History  Substance Use Topics  . Smoking status: Former Smoker -- 0.25 packs/day for 3 years    Types: Cigarettes    Quit date: 02/11/1968  . Smokeless tobacco: Never Used  . Alcohol Use: No   OB History    No data available     Review of Systems  All other systems reviewed and are negative.     Allergies  Ivp dye; Shellfish-derived products; Sulfa  antibiotics; Iodine; and Zithromax  Home Medications   Prior to Admission medications   Medication Sig Start Date End Date Taking? Authorizing Provider  albuterol (PROVENTIL HFA;VENTOLIN HFA) 108 (90 BASE) MCG/ACT inhaler Inhale 2 puffs into the lungs every 4 (four) hours as needed for wheezing (wheezing).    Yes Historical Provider, MD  amLODipine (NORVASC) 10 MG tablet TAKE ONE TABLET BY MOUTH ONCE DAILY 05/31/14  Yes [name], MD  loperamide (IMODIUM A-D) 2 MG tablet Take 2 mg by mouth 4 (four) times daily as needed for diarrhea or loose stools (diarrhea).   Yes Historical Provider, MD  aspirin 81 MG chewable tablet Chew 81 mg by mouth daily.    Historical Provider, MD  BIOTIN PO Take 1 tablet by mouth daily.    Historical Provider, MD  bisoprolol (ZEBETA) 5 MG tablet Take 1 tablet (5 mg total) by mouth daily. 10/10/13   Tanda Rockers, MD  cetirizine (ZYRTEC) 10 MG tablet Take 10 mg by mouth daily.     Historical Provider, MD  clotrimazole (MYCELEX) 10 MG troche One every 4 hours as needed for mouth pain Patient taking differently: Take 10 mg by mouth every 4 (four) hours as needed (for mouth pain).  09/08/13   Tanda Rockers, MD  docusate sodium (COLACE) 100 MG capsule Take 1 capsule (100 mg total) by mouth daily. 06/25/14   Pamella Pert, MD  fluticasone (FLONASE) 50 MCG/ACT nasal spray Place 1 spray into the nose 2 (two) times daily as needed for rhinitis or allergies.     Historical Provider, MD  furosemide (LASIX) 40 MG tablet Take 1 tablet (40 mg total) by mouth 2 (two) times daily. 11/04/13   [name], MD  isosorbide mononitrate (IMDUR) 30 MG 24 hr tablet Take 1 tablet (30 mg total) by mouth daily. 09/01/13   Liliane Shi, PA-C  losartan (COZAAR) 100 MG tablet Take 1 tablet (100 mg total) by mouth daily. 08/15/13   [name], MD  metolazone (ZAROXOLYN) 2.5 MG tablet Take 2.5 mg by mouth 2 (two) times a week. Take one tablet on Tuesdays and Saturdays 30 minutes before the  Furosemide.    Historical Provider, MD  nitroGLYCERIN (NITROSTAT) 0.4 MG SL tablet Place 0.4 mg under the tongue every 5 (five) minutes as needed for chest pain.    Historical Provider, MD  omeprazole (PRILOSEC) 40 MG capsule Take 40 mg by mouth daily.  07/19/13   Historical Provider, MD  oxyCODONE-acetaminophen (PERCOCET) 5-325 MG per tablet Take 1 tablet by mouth every 6 (six) hours as needed. 06/25/14   Pamella Pert, MD  potassium chloride SA (K-DUR,KLOR-CON) 20 MEQ tablet Take 1 tablet (20 mEq total) by mouth daily. Patient taking differently: Take 20 mEq by mouth 2 (two) times daily.  11/10/13   [name],  MD  pravastatin (PRAVACHOL) 80 MG tablet Take 80 mg by mouth every evening.     Historical Provider, MD  spironolactone (ALDACTONE) 25 MG tablet TAKE ONE-HALF TABLET BY MOUTH TWICE DAILY 09/09/13   Lorretta Harp, MD  SYMBICORT 160-4.5 MCG/ACT inhaler Inhale 2 puffs into the lungs 2 (two) times daily. 12/02/13   Historical Provider, MD   BP 113/75 mmHg  Pulse 109  Temp(Src) 98.2 F (36.8 C) (Oral)  Resp 16  Ht 5\' 3"  (1.6 m)  Wt 240 lb (108.863 kg)  BMI 42.52 kg/m2  SpO2 93% Physical Exam  Constitutional: She is oriented to person, place, and time. She appears well-developed. She appears distressed (Uncomfortable).  Obese  HENT:  Head: Normocephalic and atraumatic.  Right Ear: External ear normal.  Left Ear: External ear normal.  Eyes: Conjunctivae and EOM are normal. Pupils are equal, round, and reactive to light.  Neck: Normal range of motion and phonation normal. Neck supple.  Cardiovascular: Normal rate, regular rhythm and normal heart sounds.   Pulmonary/Chest: Effort normal and breath sounds normal. No respiratory distress. She exhibits no bony tenderness.  Abdominal: Soft. Bowel sounds are normal. She exhibits no mass. There is tenderness (Diffuse, moderate). There is no guarding.  Musculoskeletal: Normal range of motion. She exhibits no edema.  Neurological: She is  alert and oriented to person, place, and time. No cranial nerve deficit or sensory deficit. She exhibits normal muscle tone. Coordination normal.  Skin: Skin is warm, dry and intact.  Psychiatric: She has a normal mood and affect. Her behavior is normal. Judgment and thought content normal.  Nursing note and vitals reviewed.   ED Course  Procedures (including critical care time)  Medications  HYDROmorphone (DILAUDID) injection 1 mg (1 mg Intravenous Given 07/07/14 2044)  sodium chloride 0.9 % bolus 1,000 mL (not administered)  cefTRIAXone (ROCEPHIN) 1 g in dextrose 5 % 50 mL IVPB (not administered)  ondansetron (ZOFRAN) injection 4 mg (4 mg Intravenous Given 07/07/14 2044)  sodium chloride 0.9 % bolus 1,000 mL (1,000 mLs Intravenous New Bag/Given 07/07/14 2114)    Patient Vitals for the past 24 hrs:  BP Temp Temp src Pulse Resp SpO2 Height Weight  07/07/14 2230 113/75 mmHg - - 109 16 93 % - -  07/07/14 2122 - - - 95 16 95 % - -  07/07/14 2107 - - - 97 17 93 % - -  07/07/14 2106 - - - 98 11 (!) 87 % - -  07/07/14 2105 92/55 mmHg - - 98 23 (!) 89 % - -  07/07/14 2100 (!) 88/62 mmHg - - 101 21 92 % - -  07/07/14 1954 106/61 mmHg 98.2 F (36.8 C) Oral 107 16 92 % 5\' 3"  (1.6 m) 240 lb (108.863 kg)    11:33 PM Reevaluation with update and discussion. After initial assessment and treatment, an updated evaluation reveals she feels more comfortable at this time. She is thirsty. She has help at home. Findings discussed with patient, all questions answered. Raileigh Sabater L      Labs Review Labs Reviewed  CBC WITH DIFFERENTIAL/PLATELET - Abnormal; Notable for the following:    WBC 14.0 (*)    RBC 5.62 (*)    Hemoglobin 15.8 (*)    HCT 48.8 (*)    Neutrophils Relative % 85 (*)    Neutro Abs 11.9 (*)    Lymphocytes Relative 8 (*)    All other components within normal limits  COMPREHENSIVE METABOLIC PANEL - Abnormal; Notable  for the following:    Sodium 134 (*)    CO2 21 (*)    Glucose,  Bld 161 (*)    BUN 43 (*)    Creatinine, Ser 1.60 (*)    GFR calc non Af Amer 32 (*)    GFR calc Af Amer 37 (*)    All other components within normal limits  URINALYSIS, ROUTINE W REFLEX MICROSCOPIC (NOT AT [facility]) - Abnormal; Notable for the following:    Color, Urine AMBER (*)    APPearance TURBID (*)    Hgb urine dipstick SMALL (*)    Bilirubin Urine MODERATE (*)    Protein, ur >300 (*)    Leukocytes, UA SMALL (*)    All other components within normal limits  URINE MICROSCOPIC-ADD ON - Abnormal; Notable for the following:    Squamous Epithelial / LPF MANY (*)    Bacteria, UA MANY (*)    Casts HYALINE CASTS (*)    All other components within normal limits  URINE CULTURE  LIPASE, BLOOD    Imaging Review No results found.   EKG Interpretation   Date/Time:  Friday Jul 07 2014 20:52:06 EDT Ventricular Rate:  103 PR Interval:  177 QRS Duration: 156 QT Interval:  401 QTC Calculation: 525 R Axis:   -99 Text Interpretation:  Ventricular-paced rhythm No further analysis  attempted due to paced rhythm Baseline wander in lead(s) V1 V3 V4 V5 V6  since last tracing no significant change Confirmed by Eulis Foster  MD, Vira Agar  [id number]) on 07/07/2014 10:03:51 PM      MDM   Final diagnoses:  Urinary tract infection without hematuria, site unspecified  Generalized abdominal pain  Nausea vomiting and diarrhea    Nonspecific abdominal pain with vomiting, diarrhea. Incidental urinary tract infection. No evidence for severe sepsis, metabolic instability, or suspected impending vascular collapse.  Nursing Notes Reviewed/ Care Coordinated Applicable Imaging Reviewed Interpretation of Laboratory Data incorporated into ED treatment  The patient appears reasonably screened and/or stabilized for discharge and I doubt any other medical condition or other [facility] requiring further screening, evaluation, or treatment in the ED at this time prior to discharge.  Plan: Home Medications- Keflex, Zofran;  Home Treatments- push oral fluids; return here if the recommended treatment, does not improve the symptoms; Recommended follow up- PCP checkup one week. GI as scheduled next week. ]   Daleen Bo, MD 07/08/14 [id number]

## 2014-07-07 NOTE — ED Notes (Signed)
Bed: WA06 Expected date:  Expected time:  Means of arrival:  Comments: EMS 

## 2014-07-07 NOTE — ED Notes (Signed)
Pt aware of the need for a urine sample. 

## 2014-07-07 NOTE — ED Notes (Signed)
Pt from home via [place] generalized abdominal pain, nausea, vomiting, and diarrhea x 2 days. She reports this is not her typical IBS symptoms. She denies urinary symptoms. Reports fever on Wednesday and last pm as high as 102.0. Pt is alert and oriented. She reports other family members with same symptoms.

## 2014-07-07 NOTE — Discharge Instructions (Signed)
Abdominal Pain, Women Abdominal (stomach, pelvic, or belly) pain can be caused by many things. It is important to tell your doctor:  The location of the pain.  Does it come and go or is it present all the time?  Are there things that start the pain (eating certain foods, exercise)?  Are there other symptoms associated with the pain (fever, nausea, vomiting, diarrhea)? All of this is helpful to know when trying to find the cause of the pain. CAUSES   Stomach: virus or bacteria infection, or ulcer.  Intestine: appendicitis (inflamed appendix), regional ileitis (Crohn's disease), ulcerative colitis (inflamed colon), irritable bowel syndrome, diverticulitis (inflamed diverticulum of the colon), or cancer of the stomach or intestine.  Gallbladder disease or stones in the gallbladder.  Kidney disease, kidney stones, or infection.  Pancreas infection or cancer.  Fibromyalgia (pain disorder).  Diseases of the female organs:  Uterus: fibroid (non-cancerous) tumors or infection.  Fallopian tubes: infection or tubal pregnancy.  Ovary: cysts or tumors.  Pelvic adhesions (scar tissue).  Endometriosis (uterus lining tissue growing in the pelvis and on the pelvic organs).  Pelvic congestion syndrome (female organs filling up with blood just before the menstrual period).  Pain with the menstrual period.  Pain with ovulation (producing an egg).  Pain with an IUD (intrauterine device, birth control) in the uterus.  Cancer of the female organs.  Functional pain (pain not caused by a disease, may improve without treatment).  Psychological pain.  Depression. DIAGNOSIS  Your doctor will decide the seriousness of your pain by doing an examination.  Blood tests.  X-rays.  Ultrasound.  CT scan (computed tomography, special type of X-ray).  MRI (magnetic resonance imaging).  Cultures, for infection.  Barium enema (dye inserted in the large intestine, to better view it with  X-rays).  Colonoscopy (looking in intestine with a lighted tube).  Laparoscopy (minor surgery, looking in abdomen with a lighted tube).  Major abdominal exploratory surgery (looking in abdomen with a large incision). TREATMENT  The treatment will depend on the cause of the pain.   Many cases can be observed and treated at home.  Over-the-counter medicines recommended by your caregiver.  Prescription medicine.  Antibiotics, for infection.  Birth control pills, for painful periods or for ovulation pain.  Hormone treatment, for endometriosis.  Nerve blocking injections.  Physical therapy.  Antidepressants.  Counseling with a psychologist or psychiatrist.  Minor or major surgery. HOME CARE INSTRUCTIONS   Do not take laxatives, unless directed by your caregiver.  Take over-the-counter pain medicine only if ordered by your caregiver. Do not take aspirin because it can cause an upset stomach or bleeding.  Try a clear liquid diet (broth or water) as ordered by your caregiver. Slowly move to a bland diet, as tolerated, if the pain is related to the stomach or intestine.  Have a thermometer and take your temperature several times a day, and record it.  Bed rest and sleep, if it helps the pain.  Avoid sexual intercourse, if it causes pain.  Avoid stressful situations.  Keep your follow-up appointments and tests, as your caregiver orders.  If the pain does not go away with medicine or surgery, you may try:  Acupuncture.  Relaxation exercises (yoga, meditation).  Group therapy.  Counseling. SEEK MEDICAL CARE IF:   You notice certain foods cause stomach pain.  Your home care treatment is not helping your pain.  You need stronger pain medicine.  You want your IUD removed.  You feel faint or  lightheaded. °· You develop nausea and vomiting. °· You develop a rash. °· You are having side effects or an allergy to your medicine. °SEEK IMMEDIATE MEDICAL CARE IF:  °· Your  pain does not go away or gets worse. °· You have a fever. °· Your pain is felt only in portions of the abdomen. The right side could possibly be appendicitis. The left lower portion of the abdomen could be colitis or diverticulitis. °· You are passing blood in your stools (bright red or black tarry stools, with or without vomiting). °· You have blood in your urine. °· You develop chills, with or without a fever. °· You pass out. °MAKE SURE YOU:  °· Understand these instructions. °· Will watch your condition. °· Will get help right away if you are not doing well or get worse. °Document Released: 11/24/2006 Document Revised: 06/13/2013 Document Reviewed: 12/14/2008 °[employer]® Patient Information ©2015 [employer]. This information is not intended to replace advice given to you by your health care provider. Make sure you discuss any questions you have with your health care provider. ° °Diarrhea °Diarrhea is frequent loose and watery bowel movements. It can cause you to feel weak and dehydrated. Dehydration can cause you to become tired and thirsty, have a dry mouth, and have decreased urination that often is dark yellow. Diarrhea is a sign of another problem, most often an infection that will not last long. In most cases, diarrhea typically lasts 2-3 days. However, it can last longer if it is a sign of something more serious. It is important to treat your diarrhea as directed by your caregiver to lessen or prevent future episodes of diarrhea. °CAUSES  °Some common causes include: °· Gastrointestinal infections caused by viruses, bacteria, or parasites. °· Food poisoning or food allergies. °· Certain medicines, such as antibiotics, chemotherapy, and laxatives. °· Artificial sweeteners and fructose. °· Digestive disorders. °HOME CARE INSTRUCTIONS °· Ensure adequate fluid intake (hydration): Have 1 cup (8 oz) of fluid for each diarrhea episode. Avoid fluids that contain simple sugars or sports drinks, fruit juices,  whole milk products, and sodas. Your urine should be clear or pale yellow if you are drinking enough fluids. Hydrate with an oral rehydration solution that you can purchase at pharmacies, retail stores, and online. You can prepare an oral rehydration solution at home by mixing the following ingredients together: °·  - tsp table salt. °· ¾ tsp baking soda. °·  tsp salt substitute containing potassium chloride. °· 1  tablespoons sugar. °· 1 L (34 oz) of water. °· Certain foods and beverages may increase the speed at which food moves through the gastrointestinal (GI) tract. These foods and beverages should be avoided and include: °· Caffeinated and alcoholic beverages. °· High-fiber foods, such as raw fruits and vegetables, nuts, seeds, and whole grain breads and cereals. °· Foods and beverages sweetened with sugar alcohols, such as xylitol, sorbitol, and mannitol. °· Some foods may be well tolerated and may help thicken stool including: °· Starchy foods, such as rice, toast, pasta, low-sugar cereal, oatmeal, grits, baked potatoes, crackers, and bagels. °· Bananas. °· Applesauce. °· Add probiotic-rich foods to help increase healthy bacteria in the GI tract, such as yogurt and fermented milk products. °· Wash your hands well after each diarrhea episode. °· Only take over-the-counter or prescription medicines as directed by your caregiver. °· Take a warm bath to relieve any burning or pain from frequent diarrhea episodes. °SEEK IMMEDIATE MEDICAL CARE IF:  °· You are unable to   keep fluids down.  You have persistent vomiting.  You have blood in your stool, or your stools are black and tarry.  You do not urinate in 6-8 hours, or there is only a small amount of very dark urine.  You have abdominal pain that increases or localizes.  You have weakness, dizziness, confusion, or light-headedness.  You have a severe headache.  Your diarrhea gets worse or does not get better.  You have a fever or persistent  symptoms for more than 2-3 days.  You have a fever and your symptoms suddenly get worse. MAKE SURE YOU:   Understand these instructions.  Will watch your condition.  Will get help right away if you are not doing well or get worse. Document Released: 01/17/2002 Document Revised: 06/13/2013 Document Reviewed: 10/05/2011 [facility] Patient Information 2015 [place], [place]. This information is not intended to replace advice given to you by your health care provider. Make sure you discuss any questions you have with your health care provider.  Nausea and Vomiting Nausea means you feel sick to your stomach. Throwing up (vomiting) is a reflex where stomach contents come out of your mouth. HOME CARE   Take medicine as told by your doctor.  Do not force yourself to eat. However, you do need to drink fluids.  If you feel like eating, eat a normal diet as told by your doctor.  Eat rice, wheat, potatoes, bread, lean meats, yogurt, fruits, and vegetables.  Avoid high-fat foods.  Drink enough fluids to keep your pee (urine) clear or pale yellow.  Ask your doctor how to replace body fluid losses (rehydrate). Signs of body fluid loss (dehydration) include:  Feeling very thirsty.  Dry lips and mouth.  Feeling dizzy.  Dark pee.  Peeing less than normal.  Feeling confused.  Fast breathing or heart rate. GET HELP RIGHT AWAY IF:   You have blood in your throw up.  You have black or bloody poop (stool).  You have a bad headache or stiff neck.  You feel confused.  You have bad belly (abdominal) pain.  You have chest pain or trouble breathing.  You do not pee at least once every 8 hours.  You have cold, clammy skin.  You keep throwing up after 24 to 48 hours.  You have a fever. MAKE SURE YOU:   Understand these instructions.  Will watch your condition.  Will get help right away if you are not doing well or get worse. Document Released: 07/16/2007 Document Revised:  04/21/2011 Document Reviewed: 06/28/2010 [facility] Patient Information 2015 [place], [place]. This information is not intended to replace advice given to you by your health care provider. Make sure you discuss any questions you have with your health care provider.  Urinary Tract Infection Urinary tract infections (UTIs) can develop anywhere along your urinary tract. Your urinary tract is your body's drainage system for removing wastes and extra water. Your urinary tract includes two kidneys, two ureters, a bladder, and a urethra. Your kidneys are a pair of bean-shaped organs. Each kidney is about the size of your fist. They are located below your ribs, one on each side of your spine. CAUSES Infections are caused by microbes, which are microscopic organisms, including fungi, viruses, and bacteria. These organisms are so small that they can only be seen through a microscope. Bacteria are the microbes that most commonly cause UTIs. SYMPTOMS  Symptoms of UTIs may vary by age and gender of the patient and by the location of the infection. Symptoms  in young women typically include a frequent and intense urge to urinate and a painful, burning feeling in the bladder or urethra during urination. Older women and men are more likely to be tired, shaky, and weak and have muscle aches and abdominal pain. A fever may mean the infection is in your kidneys. Other symptoms of a kidney infection include pain in your back or sides below the ribs, nausea, and vomiting. DIAGNOSIS To diagnose a UTI, your caregiver will ask you about your symptoms. Your caregiver also will ask to provide a urine sample. The urine sample will be tested for bacteria and white blood cells. White blood cells are made by your body to help fight infection. TREATMENT  Typically, UTIs can be treated with medication. Because most UTIs are caused by a bacterial infection, they usually can be treated with the use of antibiotics. The choice of antibiotic  and length of treatment depend on your symptoms and the type of bacteria causing your infection. HOME CARE INSTRUCTIONS  If you were prescribed antibiotics, take them exactly as your caregiver instructs you. Finish the medication even if you feel better after you have only taken some of the medication.  Drink enough water and fluids to keep your urine clear or pale yellow.  Avoid caffeine, tea, and carbonated beverages. They tend to irritate your bladder.  Empty your bladder often. Avoid holding urine for long periods of time.  Empty your bladder before and after sexual intercourse.  After a bowel movement, women should cleanse from front to back. Use each tissue only once. SEEK MEDICAL CARE IF:   You have back pain.  You develop a fever.  Your symptoms do not begin to resolve within 3 days. SEEK IMMEDIATE MEDICAL CARE IF:   You have severe back pain or lower abdominal pain.  You develop chills.  You have nausea or vomiting.  You have continued burning or discomfort with urination. MAKE SURE YOU:   Understand these instructions.  Will watch your condition.  Will get help right away if you are not doing well or get worse. Document Released: 11/06/2004 Document Revised: 07/29/2011 Document Reviewed: 03/07/2011 [facility] Patient Information 2015 [place], [place]. This information is not intended to replace advice given to you by your health care provider. Make sure you discuss any questions you have with your health care provider.

## 2014-07-08 NOTE — ED Notes (Signed)
Pt to finish fluids then be discharged. 300 mL left in bag.

## 2014-07-08 NOTE — ED Notes (Addendum)
Pt alert and oriented upon DC. She is calling her family for a ride home. Pt was advised to follow up with PCP in one week.  She leaves with DC papers and Prescriptions in hand. Pt wheeled to the lobby by EMT Horris Latino

## 2014-07-09 LAB — URINE CULTURE
Colony Count: >=100000
Special Requests: NORMAL

## 2014-07-12 ENCOUNTER — Other Ambulatory Visit (INDEPENDENT_AMBULATORY_CARE_PROVIDER_SITE_OTHER): Payer: Medicare Other

## 2014-07-12 ENCOUNTER — Encounter: Payer: Self-pay | Admitting: Nurse Practitioner

## 2014-07-12 ENCOUNTER — Ambulatory Visit (INDEPENDENT_AMBULATORY_CARE_PROVIDER_SITE_OTHER): Payer: Medicare Other | Admitting: Nurse Practitioner

## 2014-07-12 VITALS — BP 102/66 | HR 80 | Temp 98.4°F | Ht 63.0 in | Wt 245.6 lb

## 2014-07-12 DIAGNOSIS — R197 Diarrhea, unspecified: Secondary | ICD-10-CM | POA: Diagnosis not present

## 2014-07-12 DIAGNOSIS — R109 Unspecified abdominal pain: Secondary | ICD-10-CM | POA: Insufficient documentation

## 2014-07-12 DIAGNOSIS — R112 Nausea with vomiting, unspecified: Secondary | ICD-10-CM

## 2014-07-12 DIAGNOSIS — R1084 Generalized abdominal pain: Secondary | ICD-10-CM | POA: Insufficient documentation

## 2014-07-12 LAB — CBC WITH DIFFERENTIAL/PLATELET
Basophils Absolute: 0 10*3/uL (ref 0.0–0.1)
Basophils Relative: 0.1 % (ref 0.0–3.0)
Eosinophils Absolute: 0.1 10*3/uL (ref 0.0–0.7)
Eosinophils Relative: 0.5 % (ref 0.0–5.0)
HCT: 45.9 % (ref 36.0–46.0)
Hemoglobin: 14.8 g/dL (ref 12.0–15.0)
Lymphocytes Relative: 6.6 % — ABNORMAL LOW (ref 12.0–46.0)
Lymphs Abs: 1.1 10*3/uL (ref 0.7–4.0)
MCHC: 32.3 g/dL (ref 30.0–36.0)
MCV: 85.1 fl (ref 78.0–100.0)
Monocytes Absolute: 1 10*3/uL (ref 0.1–1.0)
Monocytes Relative: 6 % (ref 3.0–12.0)
Neutro Abs: 13.7 10*3/uL — ABNORMAL HIGH (ref 1.4–7.7)
Neutrophils Relative %: 86.8 % — ABNORMAL HIGH (ref 43.0–77.0)
Platelets: 203 10*3/uL (ref 150.0–400.0)
RBC: 5.39 Mil/uL — ABNORMAL HIGH (ref 3.87–5.11)
RDW: 15 % (ref 11.5–15.5)
WBC: 15.8 10*3/uL — ABNORMAL HIGH (ref 4.0–10.5)

## 2014-07-12 LAB — BASIC METABOLIC PANEL
BUN: 24 mg/dL — ABNORMAL HIGH (ref 6–23)
CO2: 26 mEq/L (ref 19–32)
Calcium: 9.6 mg/dL (ref 8.4–10.5)
Chloride: 104 mEq/L (ref 96–112)
Creatinine, Ser: 1.61 mg/dL — ABNORMAL HIGH (ref 0.40–1.20)
GFR: 33.6 mL/min — ABNORMAL LOW (ref >60.00)
Glucose, Bld: 140 mg/dL — ABNORMAL HIGH (ref 70–99)
Potassium: 4.2 mEq/L (ref 3.5–5.1)
Sodium: 137 mEq/L (ref 135–145)

## 2014-07-12 MED ORDER — METRONIDAZOLE 500 MG PO TABS: 500.0000 mg | ORAL_TABLET | Freq: Three times a day (TID) | ORAL | Status: DC

## 2014-07-12 MED ORDER — DICYCLOMINE HCL 20 MG PO TABS: 20.0000 mg | ORAL_TABLET | Freq: Two times a day (BID) | ORAL | Status: DC | PRN

## 2014-07-12 NOTE — Progress Notes (Signed)
HPI :  Patient is a 69 year old female with multiple medical problems on multiple medical problems, referred by PCP for an abnormal CAT scan and abdominal pain. Patient known to Dr. Olevia Perches. She has a history of irritable bowel syndrome, upper abdominal discomfort, early satiety, and bloating . She was seen last October 2013.   Patient developed acute nausea, vomiting, diarrhea and fever last Wednesday. She went to the emergency department. CT scan of the abdomen /pelvis without contrast suggested bowel wall thickening at the region just above the pylorus and first portion of the duodenum felt to be nonspecific. Her white count was elevated at 14. Hemoglobin 15.8. Other than abnormal renal function, CMET was unremarkable. Patient was given Keflex but doesn't feel much better. Still having diarrhea with multiple malodorous stools a day. Taking Imodium and anti-emetics. Hasn't been able to tolerate routine meds. Patient had antibiotics a few weeks ago for respiratory infection.    Past Medical History  Diagnosis Date  . Asthma   . Hypertension   . Arthritis   . GERD (gastroesophageal reflux disease)   . Diverticulosis   . Anemia   . CAD (coronary artery disease)   . Fatty liver disease, nonalcoholic   . Internal hemorrhoids   . Gastritis   . Hyperplastic colon polyp   . Sleep apnea   . ICD (implantable cardiac defibrillator) in place   . IBS (irritable bowel syndrome)   . Anxiety   . CHF (congestive heart failure)   . H/O hiatal hernia   . Obesity   . Back pain   . Diabetes mellitus without complication     Family History  Problem Relation Age of Onset  . Breast cancer Mother   . Diabetes Mother   . Heart disease Maternal Grandmother   . Kidney disease Maternal Grandmother   . Diabetes Maternal Grandmother   . Glaucoma Maternal Aunt   . Heart disease Maternal Aunt   . Asthma Sister    History  Substance Use Topics  . Smoking status: Former Smoker -- 0.25 packs/day for 3  years    Types: Cigarettes    Quit date: 02/11/1968  . Smokeless tobacco: Never Used  . Alcohol Use: No   Current Outpatient Prescriptions  Medication Sig Dispense Refill  . albuterol (PROVENTIL HFA;VENTOLIN HFA) 108 (90 BASE) MCG/ACT inhaler Inhale 2 puffs into the lungs every 4 (four) hours as needed for wheezing (wheezing).     [name] amLODipine (NORVASC) 10 MG tablet TAKE ONE TABLET BY MOUTH ONCE DAILY 30 tablet 6  . aspirin 81 MG chewable tablet Chew 81 mg by mouth daily.    [name] BIOTIN PO Take 1 tablet by mouth daily.    . bisoprolol (ZEBETA) 5 MG tablet Take 1 tablet (5 mg total) by mouth daily. 30 tablet 11  . cephALEXin (KEFLEX) 500 MG capsule Take 1 capsule (500 mg total) by mouth 4 (four) times daily. 28 capsule 0  . cetirizine (ZYRTEC) 10 MG tablet Take 10 mg by mouth daily.     . clotrimazole (MYCELEX) 10 MG troche One every 4 hours as needed for mouth pain (Patient taking differently: Take 10 mg by mouth every 4 (four) hours as needed (for mouth pain). ) 12 tablet 11  . docusate sodium (COLACE) 100 MG capsule Take 1 capsule (100 mg total) by mouth daily. 30 capsule 0  . fluticasone (FLONASE) 50 MCG/ACT nasal spray Place 1 spray into the nose 2 (two) times daily as needed for rhinitis or  allergies.     . furosemide (LASIX) 40 MG tablet Take 1 tablet (40 mg total) by mouth 2 (two) times daily. 60 tablet 5  . isosorbide mononitrate (IMDUR) 30 MG 24 hr tablet Take 1 tablet (30 mg total) by mouth daily. 30 tablet 11  . loperamide (IMODIUM A-D) 2 MG tablet Take 2 mg by mouth 4 (four) times daily as needed for diarrhea or loose stools (diarrhea).    . losartan (COZAAR) 100 MG tablet Take 1 tablet (100 mg total) by mouth daily. 90 tablet 2  . metolazone (ZAROXOLYN) 2.5 MG tablet Take 2.5 mg by mouth 2 (two) times a week. Take one tablet on Tuesdays and Saturdays 30 minutes before the Furosemide.    . nitroGLYCERIN (NITROSTAT) 0.4 MG SL tablet Place 0.4 mg under the tongue every 5 (five) minutes  as needed for chest pain.    [name] omeprazole (PRILOSEC) 40 MG capsule Take 40 mg by mouth daily.     . ondansetron (ZOFRAN) 8 MG tablet Take 1 tablet (8 mg total) by mouth every 8 (eight) hours as needed for nausea or vomiting. 20 tablet 0  . oxyCODONE-acetaminophen (PERCOCET) 5-325 MG per tablet Take 1 tablet by mouth every 6 (six) hours as needed. 20 tablet 0  . potassium chloride SA (K-DUR,KLOR-CON) 20 MEQ tablet Take 1 tablet (20 mEq total) by mouth daily. (Patient taking differently: Take 20 mEq by mouth 2 (two) times daily. ) 60 tablet 11  . pravastatin (PRAVACHOL) 80 MG tablet Take 80 mg by mouth every evening.     [name] spironolactone (ALDACTONE) 25 MG tablet TAKE ONE-HALF TABLET BY MOUTH TWICE DAILY 30 tablet 11  . SYMBICORT 160-4.5 MCG/ACT inhaler Inhale 2 puffs into the lungs 2 (two) times daily.     No current facility-administered medications for this visit.   Allergies  Allergen Reactions  . Ivp Dye [Iodinated Diagnostic Agents] Shortness Of Breath    No reaction to PO contrast with non-ionic dye.06-25-2014/rsm  . Shellfish-Derived Products Anaphylaxis  . Sulfa Antibiotics Shortness Of Breath  . Iodine Hives  . Zithromax [Azithromycin] Rash     Ct Abdomen Pelvis Wo Contrast  06/25/2014   CLINICAL DATA:  Back and lower abdominal pain. Assess for diverticulitis.  EXAM: CT ABDOMEN AND PELVIS WITHOUT CONTRAST  TECHNIQUE: Multidetector CT imaging of the abdomen and pelvis was performed following the standard protocol without IV contrast.  COMPARISON:  January 10, 2014  FINDINGS: The liver, spleen, pancreas, gallbladder, adrenal glands are normal. There are bilateral parapelvic renal cysts. There is no nephrolithiasis bilaterally. There is no hydronephrosis bilaterally. Stable small cortical renal cysts are unchanged. There is atherosclerosis of the abdominal aorta without aneurysmal dilatation. There is no abdominal lymphadenopathy.  There is diverticulosis of colon without diverticulitis.  The appendix has been surgically removed. There is no small bowel obstruction. There is suggestion of bowel wall thickening at the region of pylorus and first portion of duodenum.  Fluid-filled bladder is normal. The uterus is normal. The visualized lung bases are clear. Degenerative joint changes of the spine are identified.  IMPRESSION: Diverticulosis without diverticulitis.  No small bowel obstruction.  Findings suggesting bowel wall thickening at the region of the pylorus and first portion the duodenum. This is nonspecific. Suggest further evaluation with upper GI series on outpatient basis.   Electronically Signed   By: Abelardo Diesel M.D.   On: 06/25/2014 09:29     Physical Exam: BP 102/66 mmHg  Pulse 80  Ht 5\' 3"  (1.6  m)  Wt 245 lb 9.6 oz (111.403 kg)  BMI 43.52 kg/m2 Constitutional: Pleasant white female in no acute distress. HEENT: Normocephalic and atraumatic. Conjunctivae are normal. No scleral icterus. Neck supple.  Cardiovascular: Normal rate, regular rhythm.  Pulmonary/chest: Effort normal and breath sounds normal. No wheezing, rales or rhonchi. Abdominal: Soft, nondistended, nontender. Bowel sounds active throughout. There are no masses palpable. No hepatomegaly. Extremities: no edema Lymphadenopathy: No cervical adenopathy noted. Neurological: Alert and oriented to person place and time. Skin: Skin is warm and dry. No rashes noted. Psychiatric: Normal mood and affect. Behavior is normal.   ASSESSMENT AND PLAN:  3. 69 year old female with multiple medical problems, on multiple medications. Recently seen in ED with nausea, vomiting, diarrhea, fever and generalized abdominal pain. Patient has irritable bowel syndrome. She typically has no more than 2 bowel movements a day, now having 10-15 loose, malodorous stools a day. She could have gastroenteritis. Patient had antibiotics for respiratory infection several weeks ago so rule out C-diff.   > check stool for c-diff. Given  suspicion will treat empirically with Flagyl 500 mg 3 times a day for 14 days. Stop Keflex.  >Continue Zofran but take regularly every 8 hours over next 48 hours then as needed  > Until C. difficile excluded, advised to decrease use of Imodium. Do not take more than 2 mg twice a day.   > Increase fluid intake to prevent dehydration  > Recheck labs today. Labs in emergency department suggested acute kidney injury. Patient has not taken Lasix in a few days, mainly because of the nausea and diarrhea so hopefully creatinine has not continued to rise.  > I will call patient with stool study and lab test results indicative condition update  > She is requesting something additional for pain. Given oxycodone in ED. She is taking 1 every 8 hours. I will add Bentyl to take twice daily as needed.  2. Nonspecific finding on CTscan: Bowel wall thickening at region of pylorus and duodenum. She is on a PPI. Not sure that findings is related to recent GI symptoms but if nausea / vomiting persists will need EGD.   3. Colon cancer screening. Greater than 10 years since colonoscopy. Polyps removed Aug 2004 (no path in computer). Patient has multiple medical problems increasing her risk for procedures. Will defer to Dr. Olevia Perches.   Cc: Darcus Austin, MD

## 2014-07-12 NOTE — Patient Instructions (Signed)
Stool for lactoferrin and C. difficile PCR Limit Imodium to 2 mg twice daily until stool infection ruled out Discontinue Keflex Zofran 8 mg 3 times daily for the next 48 hours then use only as needed. She has Zofran at home Flagyl 500 mg 3 times a day times 14 days Bentyl 20 mg twice daily as needed for pain Increase fluid intake for hydration Basic metabolic profile, CBC today We will call you with lab and stool results and to see how you are doing

## 2014-07-13 LAB — CLOSTRIDIUM DIFFICILE BY PCR: Toxigenic C. Difficile by PCR: NOT DETECTED

## 2014-07-13 LAB — FECAL LACTOFERRIN, QUANT: Lactoferrin: POSITIVE

## 2014-07-15 NOTE — Progress Notes (Signed)
Reviewed and agree. I would like to see her in follow up to assess if endoscopic evaluation necessary.

## 2014-07-25 ENCOUNTER — Encounter: Payer: Self-pay | Admitting: Cardiovascular Disease

## 2014-08-08 ENCOUNTER — Other Ambulatory Visit: Payer: Self-pay | Admitting: Cardiovascular Disease

## 2014-08-08 NOTE — Telephone Encounter (Signed)
Rx(s) sent to pharmacy electronically.  

## 2014-08-14 IMAGING — CT CT CHEST W/O CM
1 of 2 series · 14 of 32 positions shown, 18 images · non-contrast
Comparison: Radiograph 08/18/2013

CLINICAL DATA: Left chest pain, asthma

EXAM:
CT CHEST WITHOUT CONTRAST
TECHNIQUE: Multidetector CT imaging of the chest was performed following the
standard protocol without IV contrast..

[Series 2: chest w/o st · axial · non-contrast · 0.70mm/px · z∈[+1499,+1724]mm · 14 of 55 slices shown, 18 images]
[im 5/55  mediastinal]
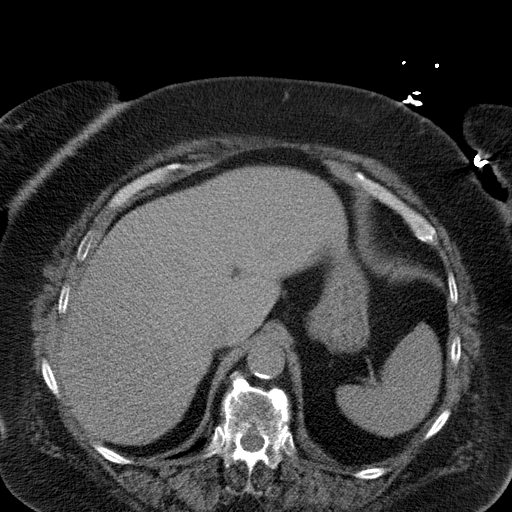
[im 5/55  lung]
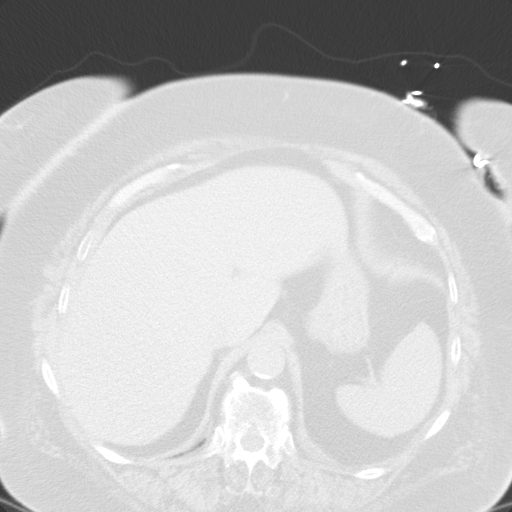
[im 9/55  lung]
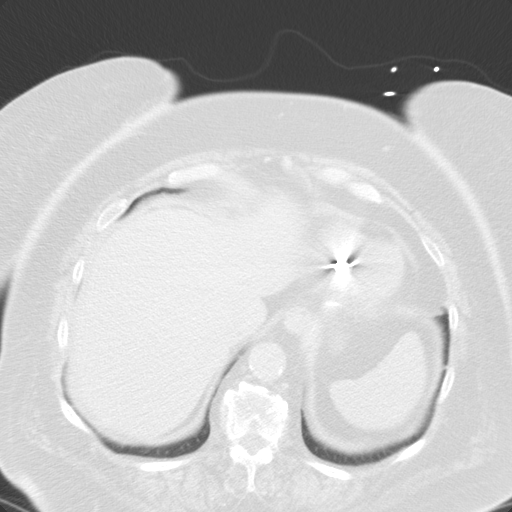
[im 13/55  lung]
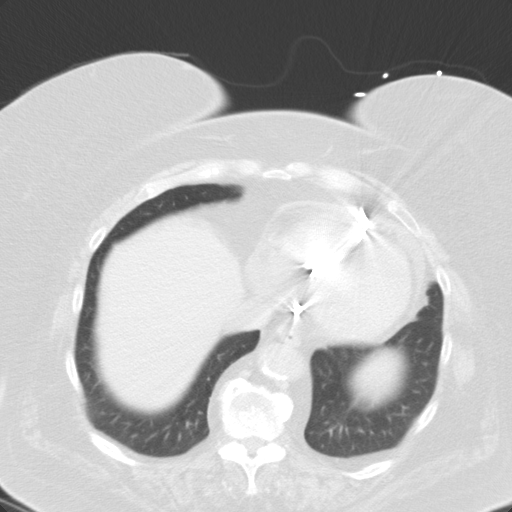
[im 17/55  lung]
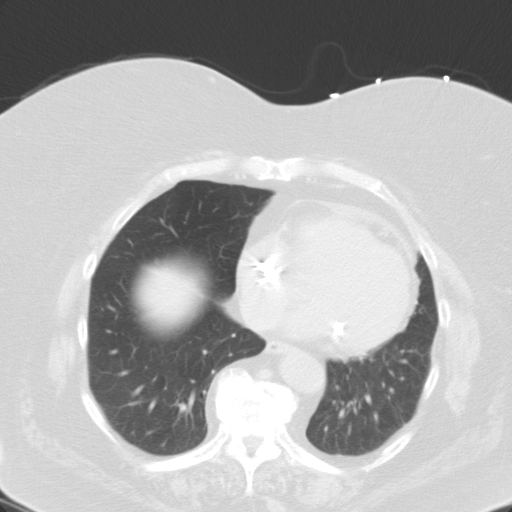
[im 21/55  mediastinal]
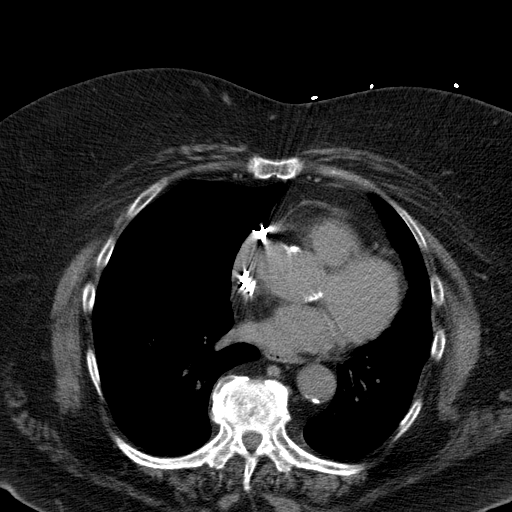
[im 21/55  lung]
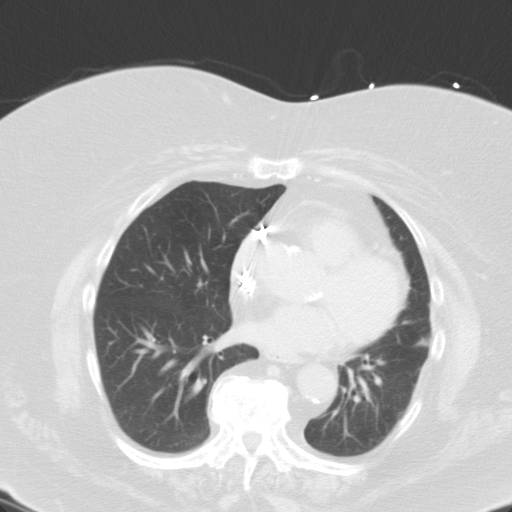
[im 25/55  lung]
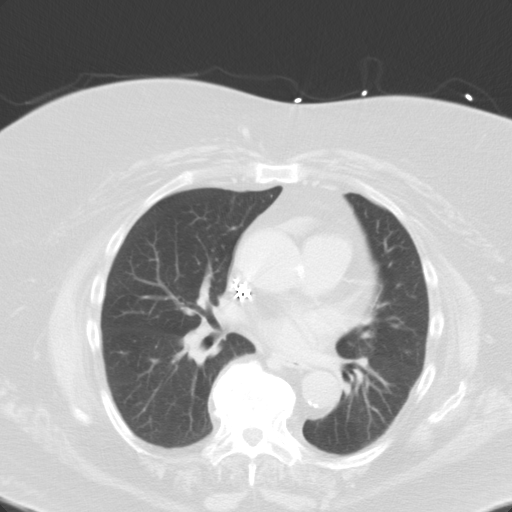
[im 26/55  lung]
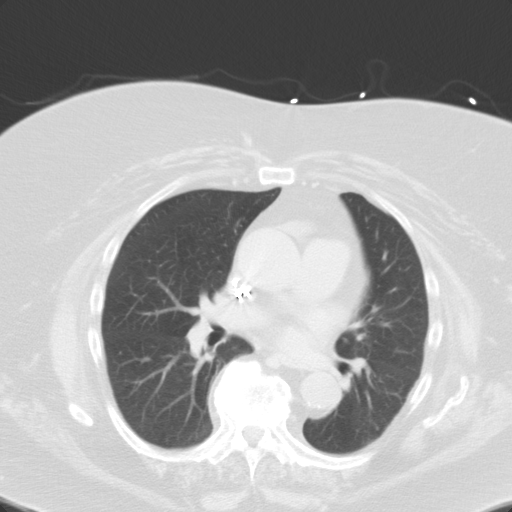
[im 28/55  lung]
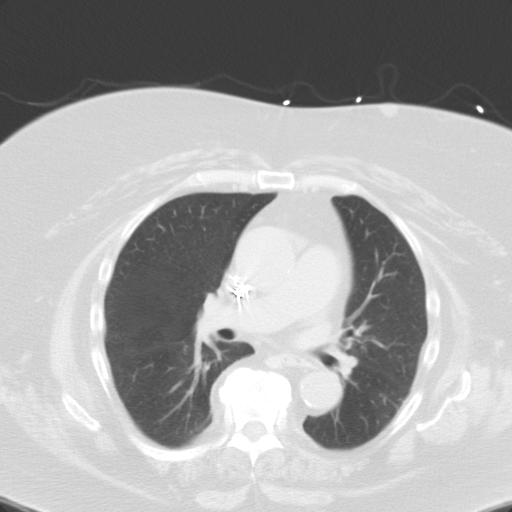
[im 30/55  mediastinal]
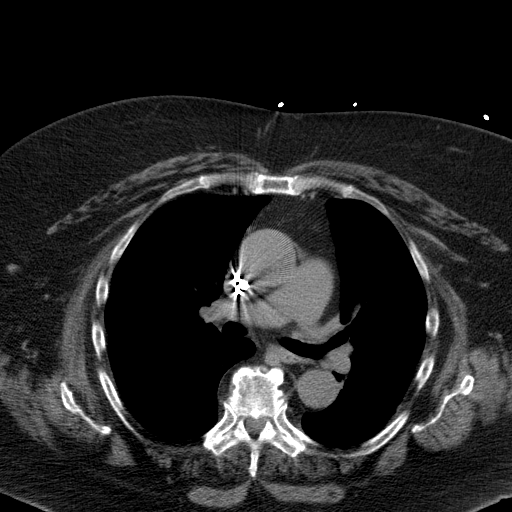
[im 30/55  lung]
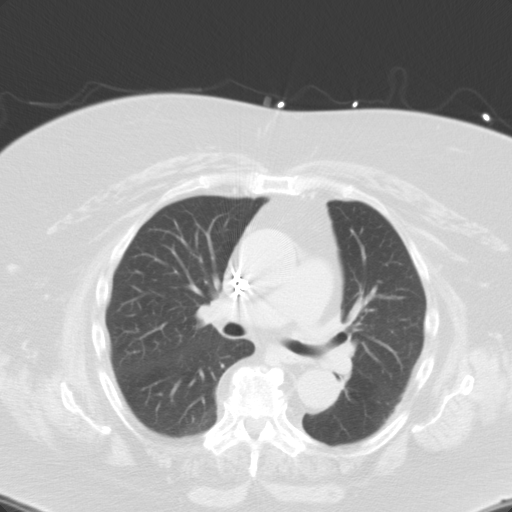
[im 34/55  lung]
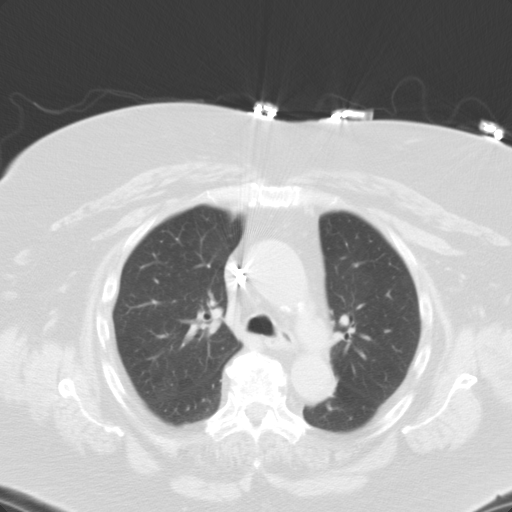
[im 38/55  lung]
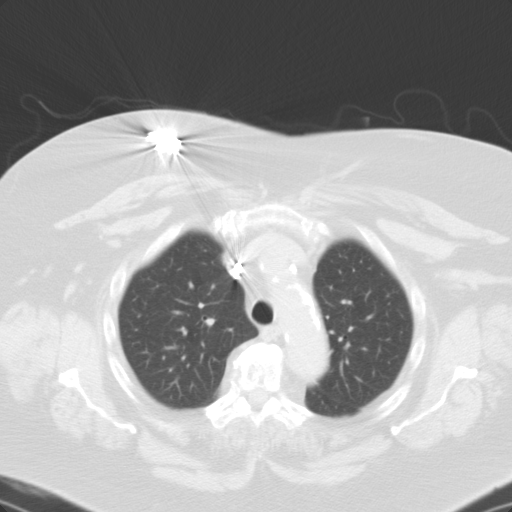
[im 42/55  lung]
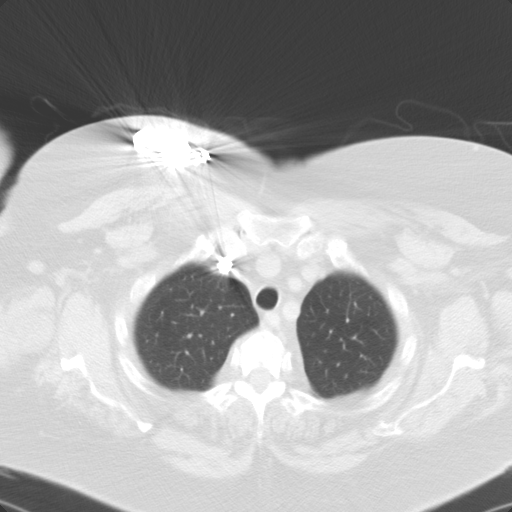
[im 46/55  mediastinal]
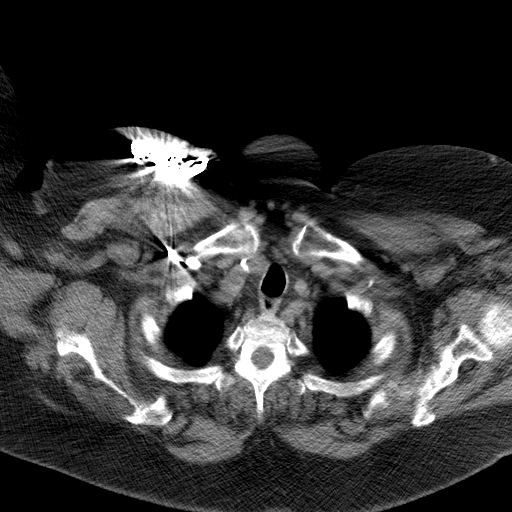
[im 46/55  lung]
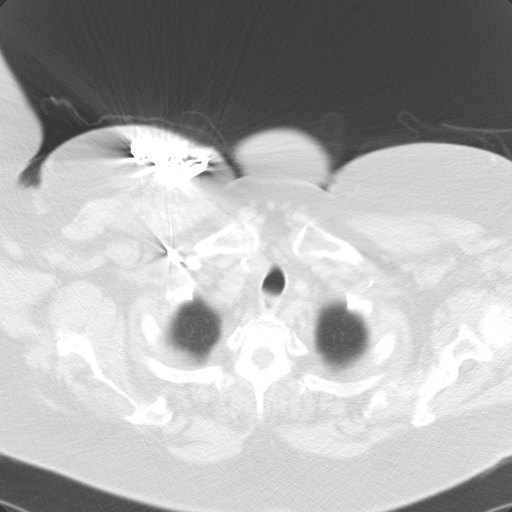
[im 50/55  lung]
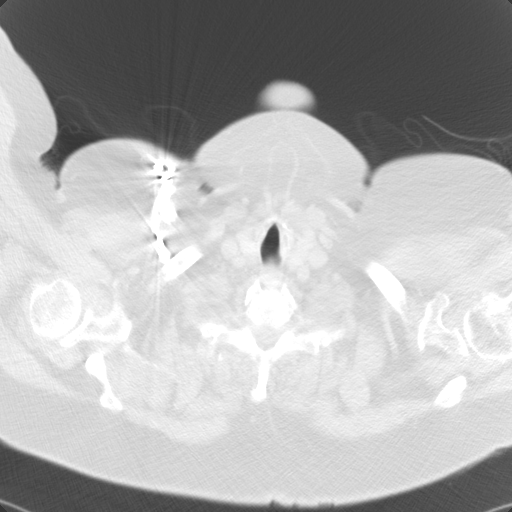

[14 of 32 positions shown; findings below may reference images not displayed]

FINDINGS: There is a pacemaker in the right chest wall with leads extending to
the left and right heart. No axillary or supraclavicular
lymphadenopathy. No mediastinal or hilar lymphadenopathy. No
pericardial fluid. Trace pericardial effusion noted. Esophagus is
normal.

Review of the lung parenchyma demonstrates no pulmonary edema or
pleural fluid. No infiltrate or pneumothorax. Airways are normal.

Review the upper abdomen is unremarkable. Limited view of the
skeleton demonstrates degenerate spurring of the spine.
IMPRESSION: No acute thoracic findings on this noncontrast CT

## 2014-09-19 ENCOUNTER — Ambulatory Visit (INDEPENDENT_AMBULATORY_CARE_PROVIDER_SITE_OTHER): Payer: Self-pay | Admitting: Internal Medicine

## 2014-09-19 DIAGNOSIS — R1084 Generalized abdominal pain: Secondary | ICD-10-CM

## 2014-09-19 NOTE — Progress Notes (Signed)
[name] December 27, 1945 [id number]  Note: This dictation was prepared with Dragon digital system. Any transcriptional errors that result from this procedure are unintentional.   History of Present Illness: This is a     Past Medical History  Diagnosis Date  . Asthma   . Hypertension   . Arthritis   . GERD (gastroesophageal reflux disease)   . Diverticulosis   . Anemia   . CAD (coronary artery disease)   . Fatty liver disease, nonalcoholic   . Internal hemorrhoids   . Gastritis   . Hyperplastic colon polyp   . Sleep apnea   . ICD (implantable cardiac defibrillator) in place   . Shortness of breath   . IBS (irritable bowel syndrome)   . Anxiety   . CHF (congestive heart failure)   . H/O hiatal hernia   . Obesity   . Back pain   . Diabetes mellitus without complication     Past Surgical History  Procedure Laterality Date  . Pacemaker insertion  2001    BS BivICD  . Cardiac defibrillator placement    . Appendectomy    . Knee surgery      right  . Ankle surgery      right  . Tubal ligation    . Cervical spine surgery      plate   . Coronary angiogram  2010  . Abdominal ultrasound  12/01/2011    Peripelvic cysts- #1- 2.4x1.9x2.3cm, #2-1.2x0.9x1.2cm  . Cardiac catheterization  05/17/1999    No significant coronary obstructive disease w/ mild 20% luminal irregularity of the first diag branch of the LAD  . Cardiac catheterization  07/08/2002    No significant CAD, moderately depressed LV systolic function  . Cardiac catheterization Bilateral 04/26/2007    Normal findings, recommend medical therapy  . Cardiac catheterization  02/18/2008    Moderate CAD, would benefit from having a functional study, recommend continue medical therapy  . Cardiac catheterization  07/23/2012    Medical therapy  . Lexiscan myoview  11/14/2011    Mild-moderate defect seen in Mid Inferolateral and Mid Anterolateral regions-consistant w/ infarct/scar. No significant ischemia demonstrated.  .  Transthoracic echocardiogram  07/23/2012    EF 55-60%, normal-mild  . Left heart catheterization with coronary angiogram N/A 07/23/2012    Procedure: LEFT HEART CATHETERIZATION WITH CORONARY ANGIOGRAM;  Surgeon: Leonie Man, MD;  Location: [facility] CATH LAB;  Service: Cardiovascular;  Laterality: N/A;  . Biv pacemaker generator change out N/A 11/02/2012    Procedure: BIV PACEMAKER GENERATOR CHANGE OUT;  Surgeon: Sanda Klein, MD;  Location: [facility] CATH LAB;  Service: Cardiovascular;  Laterality: N/A;    Allergies  Allergen Reactions  . Ivp Dye [Iodinated Diagnostic Agents] Shortness Of Breath    No reaction to PO contrast with non-ionic dye.06-25-2014/rsm  . Shellfish-Derived Products Anaphylaxis  . Sulfa Antibiotics Shortness Of Breath  . Iodine Hives  . Zithromax [Azithromycin] Rash    Family history and social history have been reviewed.  Review of Systems:   The remainder of the 10 point ROS is negative except as outlined in the H&P  Physical Exam: General Appearance Well developed, in no distress Eyes  Non icteric  HEENT  Non traumatic, normocephalic  Mouth No lesion, tongue papillated, no cheilosis Neck Supple without adenopathy, thyroid not enlarged, no carotid bruits, no JVD Lungs Clear to auscultation bilaterally COR Normal S1, normal S2, regular rhythm, no murmur, quiet precordium Abdomen  Rectal  Extremities  No pedal edema Skin No lesions  Neurological Alert and oriented x 3 Psychological Normal mood and affect  Assessment and Plan:     Delfin Edis 09/19/2014

## 2014-09-28 ENCOUNTER — Other Ambulatory Visit: Payer: Self-pay | Admitting: Cardiovascular Disease

## 2014-09-29 ENCOUNTER — Ambulatory Visit: Payer: Medicare Other | Admitting: Internal Medicine

## 2014-09-29 NOTE — Telephone Encounter (Signed)
Rx(s) sent to pharmacy electronically.  

## 2014-10-03 ENCOUNTER — Ambulatory Visit (INDEPENDENT_AMBULATORY_CARE_PROVIDER_SITE_OTHER): Payer: Medicare Other | Admitting: *Deleted

## 2014-10-03 DIAGNOSIS — I495 Sick sinus syndrome: Secondary | ICD-10-CM | POA: Diagnosis not present

## 2014-10-03 NOTE — Progress Notes (Signed)
Remote pacemaker transmission.   

## 2014-10-07 LAB — CUP PACEART REMOTE DEVICE CHECK
Battery Remaining Longevity: 114 mo
Battery Remaining Percentage: 100 %
Brady Statistic RA Percent Paced: 26 %
Brady Statistic RV Percent Paced: 100 %
Date Time Interrogation Session: 20160823073200
Lead Channel Impedance Value: 1618 Ohm
Lead Channel Impedance Value: 624 Ohm
Lead Channel Impedance Value: 852 Ohm
Lead Channel Pacing Threshold Amplitude: 0.7 V
Lead Channel Pacing Threshold Pulse Width: 0.4 ms
Lead Channel Sensing Intrinsic Amplitude: 5.6 mV
Lead Channel Setting Pacing Amplitude: 2 V
Lead Channel Setting Pacing Amplitude: 2 V
Lead Channel Setting Pacing Amplitude: 2.4 V
Lead Channel Setting Pacing Pulse Width: 0.4 ms
Lead Channel Setting Pacing Pulse Width: 0.8 ms
Lead Channel Setting Sensing Sensitivity: 2.5 mV
Lead Channel Setting Sensing Sensitivity: 2.5 mV
Pulse Gen Serial Number: 100731
Zone Setting Detection Interval: 375 ms

## 2014-10-11 ENCOUNTER — Encounter (HOSPITAL_COMMUNITY): Payer: Self-pay | Admitting: *Deleted

## 2014-10-11 ENCOUNTER — Emergency Department (INDEPENDENT_AMBULATORY_CARE_PROVIDER_SITE_OTHER)
Admission: EM | Admit: 2014-10-11 | Discharge: 2014-10-11 | Disposition: A | Discharge status: Home / Self Care | Payer: Medicare Other | Source: Home / Self Care | Attending: Family Medicine | Admitting: Family Medicine

## 2014-10-11 ENCOUNTER — Emergency Department (INDEPENDENT_AMBULATORY_CARE_PROVIDER_SITE_OTHER): Payer: Medicare Other

## 2014-10-11 DIAGNOSIS — M65271 Calcific tendinitis, right ankle and foot: Secondary | ICD-10-CM | POA: Diagnosis not present

## 2014-10-11 MED ORDER — DICLOFENAC POTASSIUM 50 MG PO TABS: 50.0000 mg | ORAL_TABLET | Freq: Three times a day (TID) | ORAL | Status: DC

## 2014-10-11 NOTE — ED Notes (Signed)
While  In    X  Ray  Pt    Became  Dizzy   And  Lightheaded   -  Pt  Did  Not  Fall  Pt  Did  Not black  Out  -  Pt  layed  Flat   For  A  While  And  Then  Stated  She  Felt  Better       bp  116  /  72        Pulse  84   Dr  kindl  present

## 2014-10-11 NOTE — Discharge Instructions (Signed)
Ice and shoe and medicine as needed, see orthopedist if further problems

## 2014-10-11 NOTE — ED Provider Notes (Addendum)
CSN: [id number]     Arrival date & time 10/11/14  1638 History   First MD Initiated Contact with Patient 10/11/14 1712     Chief Complaint  Patient presents with  . Foot Pain   (Consider location/radiation/quality/duration/timing/severity/associated sxs/prior Treatment) Patient is a 69 y.o. female presenting with lower extremity pain. The history is provided by the patient.  Foot Pain This is a new problem. The current episode started 2 days ago. The problem has been gradually worsening. The symptoms are aggravated by walking.    Past Medical History  Diagnosis Date  . Asthma   . Hypertension   . Arthritis   . GERD (gastroesophageal reflux disease)   . Diverticulosis   . Anemia   . CAD (coronary artery disease)   . Fatty liver disease, nonalcoholic   . Internal hemorrhoids   . Gastritis   . Hyperplastic colon polyp   . Sleep apnea   . ICD (implantable cardiac defibrillator) in place   . Shortness of breath   . IBS (irritable bowel syndrome)   . Anxiety   . CHF (congestive heart failure)   . H/O hiatal hernia   . Obesity   . Back pain   . Diabetes mellitus without complication    Past Surgical History  Procedure Laterality Date  . Pacemaker insertion  2001    BS BivICD  . Cardiac defibrillator placement    . Appendectomy    . Knee surgery      right  . Ankle surgery      right  . Tubal ligation    . Cervical spine surgery      plate   . Coronary angiogram  2010  . Abdominal ultrasound  12/01/2011    Peripelvic cysts- #1- 2.4x1.9x2.3cm, #2-1.2x0.9x1.2cm  . Cardiac catheterization  05/17/1999    No significant coronary obstructive disease w/ mild 20% luminal irregularity of the first diag branch of the LAD  . Cardiac catheterization  07/08/2002    No significant CAD, moderately depressed LV systolic function  . Cardiac catheterization Bilateral 04/26/2007    Normal findings, recommend medical therapy  . Cardiac catheterization  02/18/2008    Moderate CAD, would  benefit from having a functional study, recommend continue medical therapy  . Cardiac catheterization  07/23/2012    Medical therapy  . Lexiscan myoview  11/14/2011    Mild-moderate defect seen in Mid Inferolateral and Mid Anterolateral regions-consistant w/ infarct/scar. No significant ischemia demonstrated.  . Transthoracic echocardiogram  07/23/2012    EF 55-60%, normal-mild  . Left heart catheterization with coronary angiogram N/A 07/23/2012    Procedure: LEFT HEART CATHETERIZATION WITH CORONARY ANGIOGRAM;  Surgeon: Leonie Man, MD;  Location: [facility] CATH LAB;  Service: Cardiovascular;  Laterality: N/A;  . Biv pacemaker generator change out N/A 11/02/2012    Procedure: BIV PACEMAKER GENERATOR CHANGE OUT;  Surgeon: Sanda Klein, MD;  Location: [facility] CATH LAB;  Service: Cardiovascular;  Laterality: N/A;   Family History  Problem Relation Age of Onset  . Breast cancer Mother   . Diabetes Mother   . Heart disease Maternal Grandmother   . Kidney disease Maternal Grandmother   . Diabetes Maternal Grandmother   . Glaucoma Maternal Aunt   . Heart disease Maternal Aunt   . Asthma Sister    Social History  Substance Use Topics  . Smoking status: Former Smoker -- 0.25 packs/day for 3 years    Types: Cigarettes    Quit date: 02/11/1968  . Smokeless tobacco: Never Used  .  Alcohol Use: No   OB History    No data available     Review of Systems  Constitutional: Negative.   Musculoskeletal: Positive for joint swelling and gait problem.    Allergies  Ivp dye; Shellfish-derived products; Sulfa antibiotics; Iodine; and Zithromax  Home Medications   Prior to Admission medications   Medication Sig Start Date End Date Taking? Authorizing Provider  albuterol (PROVENTIL HFA;VENTOLIN HFA) 108 (90 BASE) MCG/ACT inhaler Inhale 2 puffs into the lungs every 4 (four) hours as needed for wheezing (wheezing).     Historical Provider, MD  amLODipine (NORVASC) 10 MG tablet TAKE ONE TABLET BY MOUTH ONCE  DAILY 05/31/14   Sanda Klein, MD  aspirin 81 MG chewable tablet Chew 81 mg by mouth daily.    Historical Provider, MD  BIOTIN PO Take 1 tablet by mouth daily.    Historical Provider, MD  bisoprolol (ZEBETA) 5 MG tablet Take 1 tablet (5 mg total) by mouth daily. 10/10/13   Tanda Rockers, MD  cephALEXin (KEFLEX) 500 MG capsule Take 1 capsule (500 mg total) by mouth 4 (four) times daily. 07/07/14   Daleen Bo, MD  cetirizine (ZYRTEC) 10 MG tablet Take 10 mg by mouth daily.     Historical Provider, MD  clotrimazole (MYCELEX) 10 MG troche One every 4 hours as needed for mouth pain Patient taking differently: Take 10 mg by mouth every 4 (four) hours as needed (for mouth pain).  09/08/13   Tanda Rockers, MD  diclofenac (CATAFLAM) 50 MG tablet Take 1 tablet (50 mg total) by mouth 3 (three) times daily. 10/11/14   Billy Fischer, MD  dicyclomine (BENTYL) 20 MG tablet Take 1 tablet (20 mg total) by mouth 2 (two) times daily as needed (pain). 07/12/14   Willia Craze, NP  docusate sodium (COLACE) 100 MG capsule Take 1 capsule (100 mg total) by mouth daily. 06/25/14   Pamella Pert, MD  fluticasone (FLONASE) 50 MCG/ACT nasal spray Place 1 spray into the nose 2 (two) times daily as needed for rhinitis or allergies.     Historical Provider, MD  furosemide (LASIX) 40 MG tablet Take 1 tablet (40 mg total) by mouth 2 (two) times daily. 11/04/13   [name], MD  isosorbide mononitrate (IMDUR) 30 MG 24 hr tablet Take 1 tablet (30 mg total) by mouth daily. 09/01/13   Liliane Shi, PA-C  loperamide (IMODIUM A-D) 2 MG tablet Take 2 mg by mouth 4 (four) times daily as needed for diarrhea or loose stools (diarrhea).    Historical Provider, MD  losartan (COZAAR) 100 MG tablet TAKE ONE TABLET BY MOUTH ONCE DAILY 08/08/14   [name], MD  metolazone (ZAROXOLYN) 2.5 MG tablet Take 2.5 mg by mouth 2 (two) times a week. Take one tablet on Tuesdays and Saturdays 30 minutes before the Furosemide.    Historical  Provider, MD  metroNIDAZOLE (FLAGYL) 500 MG tablet Take 1 tablet (500 mg total) by mouth 3 (three) times daily. 07/12/14   Willia Craze, NP  nitroGLYCERIN (NITROSTAT) 0.4 MG SL tablet Place 0.4 mg under the tongue every 5 (five) minutes as needed for chest pain.    Historical Provider, MD  omeprazole (PRILOSEC) 40 MG capsule Take 40 mg by mouth daily.  07/19/13   Historical Provider, MD  ondansetron (ZOFRAN) 8 MG tablet Take 1 tablet (8 mg total) by mouth every 8 (eight) hours as needed for nausea or vomiting. 07/07/14   Daleen Bo, MD  oxyCODONE-acetaminophen [facility])  5-325 MG per tablet Take 1 tablet by mouth every 6 (six) hours as needed. 06/25/14   Pamella Pert, MD  potassium chloride SA (K-DUR,KLOR-CON) 20 MEQ tablet Take 1 tablet (20 mEq total) by mouth daily. Patient taking differently: Take 20 mEq by mouth 2 (two) times daily.  11/10/13   [name], MD  pravastatin (PRAVACHOL) 80 MG tablet Take 80 mg by mouth every evening.     Historical Provider, MD  spironolactone (ALDACTONE) 25 MG tablet Take 0.5 tablets (12.5 mg total) by mouth 2 (two) times daily. 09/29/14   Lorretta Harp, MD  SYMBICORT 160-4.5 MCG/ACT inhaler Inhale 2 puffs into the lungs 2 (two) times daily. 12/02/13   Historical Provider, MD   Meds Ordered and Administered this Visit  Medications - No data to display  BP 122/76 mmHg  Pulse 79  Temp(Src) 98 F (36.7 C) (Oral)  Resp 16  SpO2 95% No data found.   Physical Exam  Constitutional: She is oriented to person, place, and time. She appears well-developed and well-nourished. She appears distressed.  Musculoskeletal: She exhibits tenderness.  Acute tenderness and sts to right foot and achilles area, primarily to post ankle area., no warmth or erythema.  Neurological: She is alert and oriented to person, place, and time.  Skin: Skin is warm and dry.  Nursing note and vitals reviewed.   ED Course  Procedures (including critical care time)  Labs  Review Labs Reviewed - No data to display  Imaging Review Dg Foot Complete Right  10/11/2014   CLINICAL DATA:  Pain in the right foot.  Unable to bear weight.  EXAM: RIGHT FOOT COMPLETE - 3+ VIEW  COMPARISON:  None.  FINDINGS: There is no evidence of fracture or dislocation. There are erosive changes with chronic appearance of the distal third metatarsal shaft at the metatarsophalangeal joint, which may be degenerative or posttraumatic. Osteophytosis of the calcaneus is noted. Prior fixation of the medial malleolus is seen. There is diffuse soft tissue edema.  IMPRESSION: No evidence of acute fracture subluxation.  Chronic appearing erosive changes at the third metatarsophalangeal joint.  Soft tissue edema.   Electronically Signed   By: Fidela Salisbury M.D.   On: 10/11/2014 17:52   X-rays reviewed and report per radiologist.   Visual Acuity Review  Right Eye Distance:   Left Eye Distance:   Bilateral Distance:    Right Eye Near:   Left Eye Near:    Bilateral Near:         MDM   1. Calcific tendonitis of ankle or foot, right     Pt had near syncopal episode in X-ray dept , no loc , prob vasovagal from foot pain, continued alert and stable and was at baseline at d/c.  Billy Fischer, MD 10/11/14 1827  Billy Fischer, MD 10/11/14 [phone]

## 2014-10-11 NOTE — ED Notes (Signed)
Pt  Reports    Pain  r  Foot   Around    The  Heel  Area     X  2    Days  She  denys any  specefic injury  She is  Alert and  Oriented  Appearing in no  Severe  Distress

## 2014-10-12 ENCOUNTER — Other Ambulatory Visit: Payer: Self-pay | Admitting: Internal Medicine

## 2014-10-12 ENCOUNTER — Other Ambulatory Visit: Payer: Self-pay | Admitting: Physician Assistant

## 2014-10-12 NOTE — Telephone Encounter (Signed)
REFILL 

## 2014-10-17 ENCOUNTER — Telehealth: Payer: Self-pay | Admitting: Physician Assistant

## 2014-10-17 ENCOUNTER — Ambulatory Visit: Payer: Medicare Other | Admitting: Physician Assistant

## 2014-10-17 NOTE — Telephone Encounter (Signed)
Received records from [facility] for appointment on 10/18/14 with Rosaria Ferries, PA.  Records given to [employer] (medical records) for Rhonda's schedule on 10/18/14. lp

## 2014-10-18 ENCOUNTER — Ambulatory Visit: Payer: Medicare Other | Admitting: Physician Assistant

## 2014-10-18 ENCOUNTER — Ambulatory Visit (INDEPENDENT_AMBULATORY_CARE_PROVIDER_SITE_OTHER): Payer: Medicare Other | Admitting: Nurse Practitioner

## 2014-10-18 ENCOUNTER — Telehealth: Payer: Self-pay | Admitting: Physician Assistant

## 2014-10-18 ENCOUNTER — Encounter: Payer: Self-pay | Admitting: Nurse Practitioner

## 2014-10-18 VITALS — HR 70 | Ht 63.0 in | Wt 246.0 lb

## 2014-10-18 DIAGNOSIS — R1084 Generalized abdominal pain: Secondary | ICD-10-CM | POA: Diagnosis not present

## 2014-10-18 NOTE — Telephone Encounter (Signed)
Pam from [facility] called [phone]) called and said they had patient there who felt dizzy, near syncopal with chest pain, left arm and jaw pain.    HR 70  Is scheduled to see Rosaria Ferries this afternoon  Discussed with Suanne Marker  Based on patients symptoms along with her driving her self advised Pam from [facility] to send to the hospital

## 2014-10-18 NOTE — Telephone Encounter (Signed)
Opened accedientally

## 2014-10-19 ENCOUNTER — Emergency Department (INDEPENDENT_AMBULATORY_CARE_PROVIDER_SITE_OTHER)
Admission: EM | Admit: 2014-10-19 | Discharge: 2014-10-19 | Disposition: A | Discharge status: Home / Self Care | Payer: Medicare Other | Source: Home / Self Care | Attending: Family Medicine | Admitting: Family Medicine

## 2014-10-19 ENCOUNTER — Emergency Department (INDEPENDENT_AMBULATORY_CARE_PROVIDER_SITE_OTHER): Payer: Medicare Other

## 2014-10-19 ENCOUNTER — Encounter (HOSPITAL_COMMUNITY): Payer: Self-pay | Admitting: Emergency Medicine

## 2014-10-19 DIAGNOSIS — S62112A Displaced fracture of triquetrum [cuneiform] bone, left wrist, initial encounter for closed fracture: Secondary | ICD-10-CM

## 2014-10-19 DIAGNOSIS — T148XXA Other injury of unspecified body region, initial encounter: Secondary | ICD-10-CM

## 2014-10-19 NOTE — ED Provider Notes (Addendum)
CSN: [id number]     Arrival date & time 10/19/14  1406 History   First MD Initiated Contact with Patient 10/19/14 1455     Chief Complaint  Patient presents with  . Wrist Pain  . Knee Pain   (Consider location/radiation/quality/duration/timing/severity/associated sxs/prior Treatment) HPI   Left arm pain. Pain is primarily in the left wrist but also has pain throughout the soft tissue of the left arm and left shoulder. Acute onset when patient fell last night all walking home from a football game. She tripped on the pavement and fell forward. Unsure of exactly how she landed on the ground. Patient also sustained a left leg abrasion. She is wearing capris which only cover part of her leg. This area is tender. Area was cleaned at home and Neosporin ointment was applied. Pain is constant. Patient took some ibuprofen with improvement in pain. Pain is constant and getting worse in the left wrist. Left wrist is now swollen and somewhat discolored. Denies any loss of sensation. Movement is intact but causes significant pain. Denies any LOC, dizziness, lightheadedness, vertigo, chest pain, palpitations.   Past Medical History  Diagnosis Date  . Asthma   . Hypertension   . Arthritis   . GERD (gastroesophageal reflux disease)   . Diverticulosis   . Anemia   . CAD (coronary artery disease)   . Fatty liver disease, nonalcoholic   . Internal hemorrhoids   . Gastritis   . Hyperplastic colon polyp   . Sleep apnea   . ICD (implantable cardiac defibrillator) in place   . Shortness of breath   . IBS (irritable bowel syndrome)   . Anxiety   . CHF (congestive heart failure)   . H/O hiatal hernia   . Obesity   . Back pain   . Diabetes mellitus without complication    Past Surgical History  Procedure Laterality Date  . Pacemaker insertion  2001    BS BivICD  . Cardiac defibrillator placement    . Appendectomy    . Knee surgery      right  . Ankle surgery      right  . Tubal ligation    .  Cervical spine surgery      plate   . Coronary angiogram  2010  . Abdominal ultrasound  12/01/2011    Peripelvic cysts- #1- 2.4x1.9x2.3cm, #2-1.2x0.9x1.2cm  . Cardiac catheterization  05/17/1999    No significant coronary obstructive disease w/ mild 20% luminal irregularity of the first diag branch of the LAD  . Cardiac catheterization  07/08/2002    No significant CAD, moderately depressed LV systolic function  . Cardiac catheterization Bilateral 04/26/2007    Normal findings, recommend medical therapy  . Cardiac catheterization  02/18/2008    Moderate CAD, would benefit from having a functional study, recommend continue medical therapy  . Cardiac catheterization  07/23/2012    Medical therapy  . Lexiscan myoview  11/14/2011    Mild-moderate defect seen in Mid Inferolateral and Mid Anterolateral regions-consistant w/ infarct/scar. No significant ischemia demonstrated.  . Transthoracic echocardiogram  07/23/2012    EF 55-60%, normal-mild  . Left heart catheterization with coronary angiogram N/A 07/23/2012    Procedure: LEFT HEART CATHETERIZATION WITH CORONARY ANGIOGRAM;  Surgeon: Leonie Man, MD;  Location: [facility] CATH LAB;  Service: Cardiovascular;  Laterality: N/A;  . Biv pacemaker generator change out N/A 11/02/2012    Procedure: BIV PACEMAKER GENERATOR CHANGE OUT;  Surgeon: Sanda Klein, MD;  Location: [facility] CATH LAB;  Service: Cardiovascular;  Laterality: N/A;   Family History  Problem Relation Age of Onset  . Breast cancer Mother   . Diabetes Mother   . Heart disease Maternal Grandmother   . Kidney disease Maternal Grandmother   . Diabetes Maternal Grandmother   . Glaucoma Maternal Aunt   . Heart disease Maternal Aunt   . Asthma Sister    Social History  Substance Use Topics  . Smoking status: Former Smoker -- 0.25 packs/day for 3 years    Types: Cigarettes    Quit date: 02/11/1968  . Smokeless tobacco: Never Used  . Alcohol Use: No   OB History    No data available      Review of Systems Per HPI with all other pertinent systems negative.   Allergies  Ivp dye; Shellfish-derived products; Sulfa antibiotics; Iodine; and Zithromax  Home Medications   Prior to Admission medications   Medication Sig Start Date End Date Taking? Authorizing Provider  albuterol (PROVENTIL HFA;VENTOLIN HFA) 108 (90 BASE) MCG/ACT inhaler Inhale 2 puffs into the lungs every 4 (four) hours as needed for wheezing (wheezing).     Historical Provider, MD  amLODipine (NORVASC) 10 MG tablet TAKE ONE TABLET BY MOUTH ONCE DAILY 05/31/14   Sanda Klein, MD  aspirin 81 MG chewable tablet Chew 81 mg by mouth daily.    Historical Provider, MD  BIOTIN PO Take 1 tablet by mouth daily.    Historical Provider, MD  bisoprolol (ZEBETA) 5 MG tablet TAKE ONE TABLET BY MOUTH ONCE DAILY. 10/12/14   Tanda Rockers, MD  cephALEXin (KEFLEX) 500 MG capsule Take 1 capsule (500 mg total) by mouth 4 (four) times daily. 07/07/14   Daleen Bo, MD  cetirizine (ZYRTEC) 10 MG tablet Take 10 mg by mouth daily.     Historical Provider, MD  clotrimazole (MYCELEX) 10 MG troche One every 4 hours as needed for mouth pain Patient taking differently: Take 10 mg by mouth every 4 (four) hours as needed (for mouth pain).  09/08/13   Tanda Rockers, MD  diclofenac (CATAFLAM) 50 MG tablet Take 1 tablet (50 mg total) by mouth 3 (three) times daily. 10/11/14   Billy Fischer, MD  dicyclomine (BENTYL) 20 MG tablet Take 1 tablet (20 mg total) by mouth 2 (two) times daily as needed (pain). 07/12/14   Willia Craze, NP  docusate sodium (COLACE) 100 MG capsule Take 1 capsule (100 mg total) by mouth daily. 06/25/14   Pamella Pert, MD  fluticasone (FLONASE) 50 MCG/ACT nasal spray Place 1 spray into the nose 2 (two) times daily as needed for rhinitis or allergies.     Historical Provider, MD  furosemide (LASIX) 40 MG tablet Take 1 tablet (40 mg total) by mouth 2 (two) times daily. 11/04/13   [name], MD  isosorbide mononitrate  (IMDUR) 30 MG 24 hr tablet TAKE ONE TABLET BY MOUTH ONCE DAILY 10/12/14   Liliane Shi, PA-C  loperamide (IMODIUM A-D) 2 MG tablet Take 2 mg by mouth 4 (four) times daily as needed for diarrhea or loose stools (diarrhea).    Historical Provider, MD  losartan (COZAAR) 100 MG tablet TAKE ONE TABLET BY MOUTH ONCE DAILY 08/08/14   [name], MD  metolazone (ZAROXOLYN) 2.5 MG tablet Take 2.5 mg by mouth 2 (two) times a week. Take one tablet on Tuesdays and Saturdays 30 minutes before the Furosemide.    Historical Provider, MD  nitroGLYCERIN (NITROSTAT) 0.4 MG SL tablet Place 0.4 mg under the tongue every 5 (  five) minutes as needed for chest pain.    Historical Provider, MD  omeprazole (PRILOSEC) 40 MG capsule Take 40 mg by mouth daily.  07/19/13   Historical Provider, MD  ondansetron (ZOFRAN) 8 MG tablet Take 1 tablet (8 mg total) by mouth every 8 (eight) hours as needed for nausea or vomiting. 07/07/14   Daleen Bo, MD  oxyCODONE-acetaminophen (PERCOCET) 5-325 MG per tablet Take 1 tablet by mouth every 6 (six) hours as needed. 06/25/14   Pamella Pert, MD  potassium chloride SA (K-DUR,KLOR-CON) 20 MEQ tablet Take 1 tablet (20 mEq total) by mouth daily. Patient taking differently: Take 20 mEq by mouth 2 (two) times daily.  11/10/13   [name], MD  pravastatin (PRAVACHOL) 80 MG tablet Take 80 mg by mouth every evening.     Historical Provider, MD  spironolactone (ALDACTONE) 25 MG tablet Take 0.5 tablets (12.5 mg total) by mouth 2 (two) times daily. 09/29/14   Lorretta Harp, MD  SYMBICORT 160-4.5 MCG/ACT inhaler Inhale 2 puffs into the lungs 2 (two) times daily. 12/02/13   Historical Provider, MD   Meds Ordered and Administered this Visit  Medications - No data to display  BP 132/68 mmHg  Pulse 68  Temp(Src) 97.8 F (36.6 C) (Oral)  Resp 19  SpO2 99% No data found.   Physical Exam Physical Exam  Constitutional: oriented to person, place, and time. appears well-developed and  well-nourished. No distress.  HENT:  Head: Normocephalic and atraumatic.  Eyes: EOMI. PERRL.  Neck: Normal range of motion.  Cardiovascular: RRR, no m/r/g, 2+ distal pulses,  Pulmonary/Chest: Effort normal and breath sounds normal. No respiratory distress.  Abdominal: Soft. Bowel sounds are normal. NonTTP, no distension.  Musculoskeletal: Left shoulder, elbow, [place] forearm normal. Left wrist very swollen and tender to palpation. Less than 2 second cap refill in the digits of the left hand.  Neurological: alert and oriented to person, place, and time.  Skin: 6 x 8 cm area of abrasion of the skin of the left lateral leg just distal to the left knee. Mild erythema but no significant induration or fluctuance. Not particularly tender to palpation.  Psychiatric: normal mood and affect. behavior is normal. Judgment and thought content normal.   ED Course  Procedures (including critical care time)  Labs Review Labs Reviewed - No data to display  Imaging Review Dg Wrist Complete Left  10/19/2014   CLINICAL DATA:  Pain and swelling of the left wrist, status post fall.  EXAM: LEFT WRIST - COMPLETE 3+ VIEW  COMPARISON:  None.  FINDINGS: There is a minimally displaced transverse triquetral fracture with minimal dorsal displacement of the distal fracture fragment. Osteoarthritic changes of the first carpometacarpal joint are seen. Bone cysts also noted within the distal radius and scaphoid. No radiopaque soft tissue foreign bodies.  IMPRESSION: Minimally displaced fracture of the triquetrum. Associated soft tissue swelling.   Electronically Signed   By: Fidela Salisbury M.D.   On: 10/19/2014 15:40     Visual Acuity Review  Right Eye Distance:   Left Eye Distance:   Bilateral Distance:    Right Eye Near:   Left Eye Near:    Bilateral Near:         MDM   1. Fracture of triquetrum of left wrist, closed, initial encounter   2. Skin abrasion    Fracture as above Discussed case w/ Dr.  Grandville Silos Wrist splint applied Will f/u in outpt clinic. [facility] office to call pt for f/u ABX  ointment and sterile bandage applied to leg Ice pack. NSAIDs    Waldemar Dickens, MD 10/19/14 1625  Waldemar Dickens, MD 10/19/14 [phone]

## 2014-10-19 NOTE — Discharge Instructions (Signed)
You have fractured your wrist.  Please call Dr. Biagio Borg office for a follow up. His office will call you for the appointment Please wear the brace as indicated.

## 2014-10-19 NOTE — ED Notes (Signed)
C/o left wrist pain and knee pain due to falling States she was at football game when she fell Ibuprofen and ice was used as tx

## 2014-10-20 NOTE — Progress Notes (Signed)
     History of Present Illness:  Patient is a 69 year old female, former patient of Dr. Olevia Perches followed for history of irritable bowel syndrome, upper abdominal discomfort, early satiety, and bloating . She has multiple medical problems and is on multiple medications. I saw patient early August for abnormal CAT scan, abdominal pain, nausea, vomiting, diarrhea and fever.  She had gone to ED where CT scan without contrast suggested bowel wall thickening at the region just above the pylorus and first portion of the duodenum, felt to be nonspecific. Her white count was elevated at 14. Hemoglobin 15.8. Other than abnormal renal function, CMET was unremarkable. Patient was given Keflex without improvement.   I ordered C. difficile study and started patient on Flagyl. She was to follow-up with Dr. Olevia Perches 09/19/14 but missed the appointment.   Patient entered the office today complaining of SOB and chest pain radiating down left arm.  Breaths were labored walking to exam room. Patient felt like she was going to pass out during drive to office. Patient has a cardiology appointment later today.   Current Medications, Allergies, Past Medical History, Past Surgical History, Family History and Social History were reviewed in [employer] record.  Physical Exam: General: Pleasant, obese, white female in no acute distress Lungs: Clear throughout to auscultation Heart: Regular rate and rhythm Abdomen: Limited exam in chair. Abdomen obese, soft,   Normal bowel sounds Neurological: Alert oriented x 4, grossly nonfocal Psychological:  Alert and cooperative. Normal mood and affect  Assessment and Recommendations:   75. 69 year old female with multiple medical problems here for follow up of abdominal pain, vomiting and diarrhea. Patient complaining of SOB, chest pain radiating into left arm and feeling like she is going to pass out. Patient feels like she is going to pass out. Has Cardiology  appointment later today-we called to see if patient could be seen earlier but they recommended ED. Patient called son who took her to [facility] ED.   2. Abdominal pain, vomiting, diarrhea. Vomiting and diarrhea resolved. She completed Flagyl. C-diff was negative. Still has intermittent abdominal discomfort and bloating which will still need to address, along with CTscan findings. Patient may need EGD. Will schedule her for follow up. For now, to ED for evaluation of #1.   3. Colon cancer screening. Patient due for surveillance colonoscopy. She was to discuss this at time of last visit with Dr. Olevia Perches but missed the appointment. Patient has multiple medical problems. She is at increased risk for procedures. Will discuss further at time of next visit.

## 2014-10-20 NOTE — Progress Notes (Signed)
Reviewed and agree with management plan.  Daksha Koone T. Magic Mohler, MD FACG 

## 2014-10-23 ENCOUNTER — Encounter: Payer: Self-pay | Admitting: Cardiology

## 2014-10-30 ENCOUNTER — Ambulatory Visit: Payer: Medicare Other | Admitting: Cardiology

## 2014-10-31 ENCOUNTER — Telehealth: Payer: Self-pay | Admitting: Cardiovascular Disease

## 2014-10-31 MED ORDER — PRAVASTATIN SODIUM 80 MG PO TABS: 80.0000 mg | ORAL_TABLET | Freq: Every evening | ORAL | Status: DC

## 2014-10-31 NOTE — Telephone Encounter (Signed)
Pt requested Pravachol refilled #[phone]. Refill submitted to patient's preferred pharmacy. Pt voiced understanding, no other stated concerns at this time.

## 2014-10-31 NOTE — Telephone Encounter (Signed)
Pt primary doctor,[name] would like Dr C to call in a prescription for Pravachol. Pt said her cholesterol is high.

## 2014-11-02 ENCOUNTER — Encounter: Payer: Self-pay | Admitting: Cardiovascular Disease

## 2014-11-06 ENCOUNTER — Ambulatory Visit: Payer: Medicare Other | Admitting: Physician Assistant

## 2014-11-10 ENCOUNTER — Other Ambulatory Visit: Payer: Self-pay | Admitting: Internal Medicine

## 2014-11-14 ENCOUNTER — Other Ambulatory Visit: Payer: Self-pay | Admitting: Internal Medicine

## 2014-11-14 IMAGING — CR DG CHEST 2V
2 series · 2 of 2 positions shown · non-contrast
Comparison: 08/18/2013

CLINICAL DATA: Chest and back pain.  Shortness breath.  Cough.

EXAM:
CHEST  2 VIEW

[w chest pa]
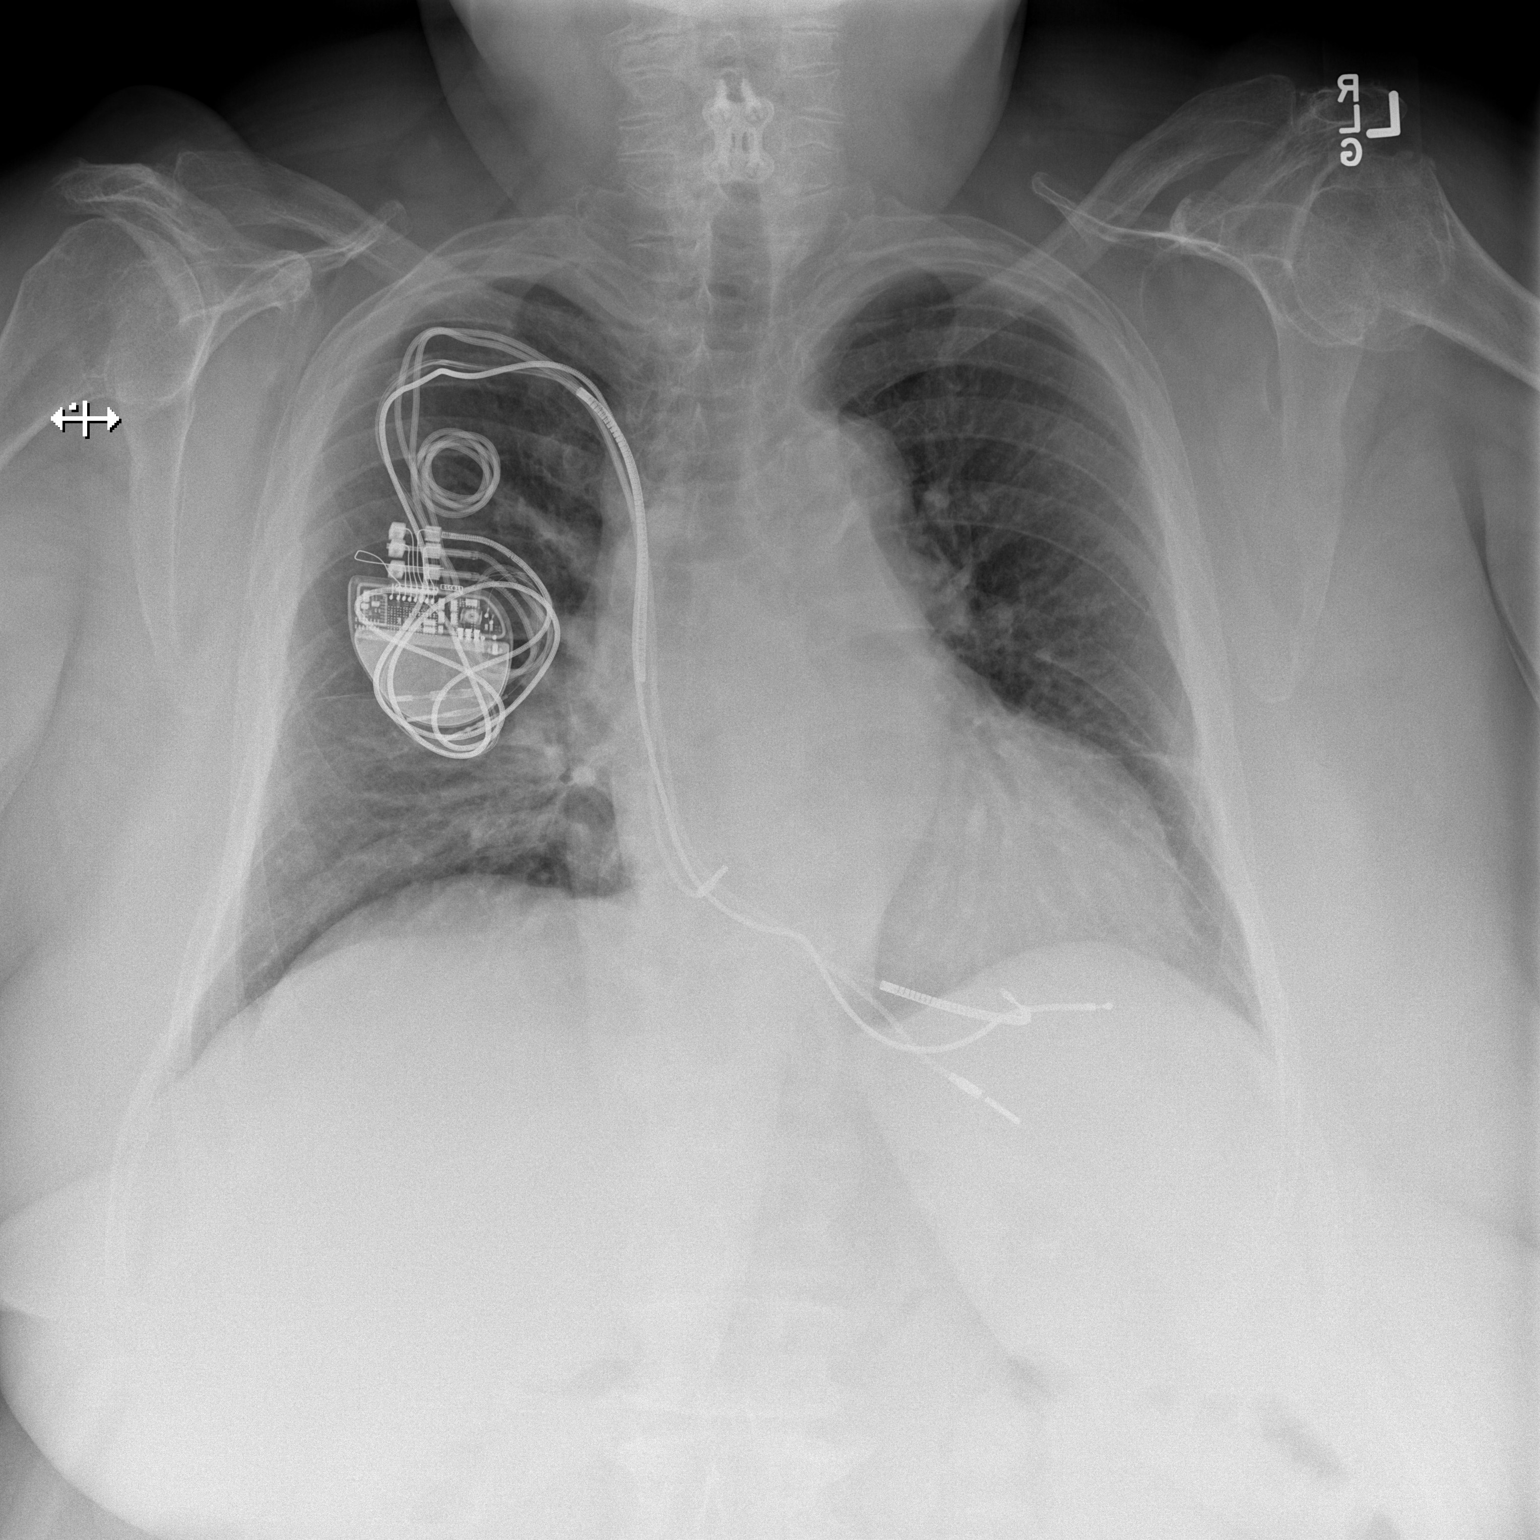

[w chest lat]
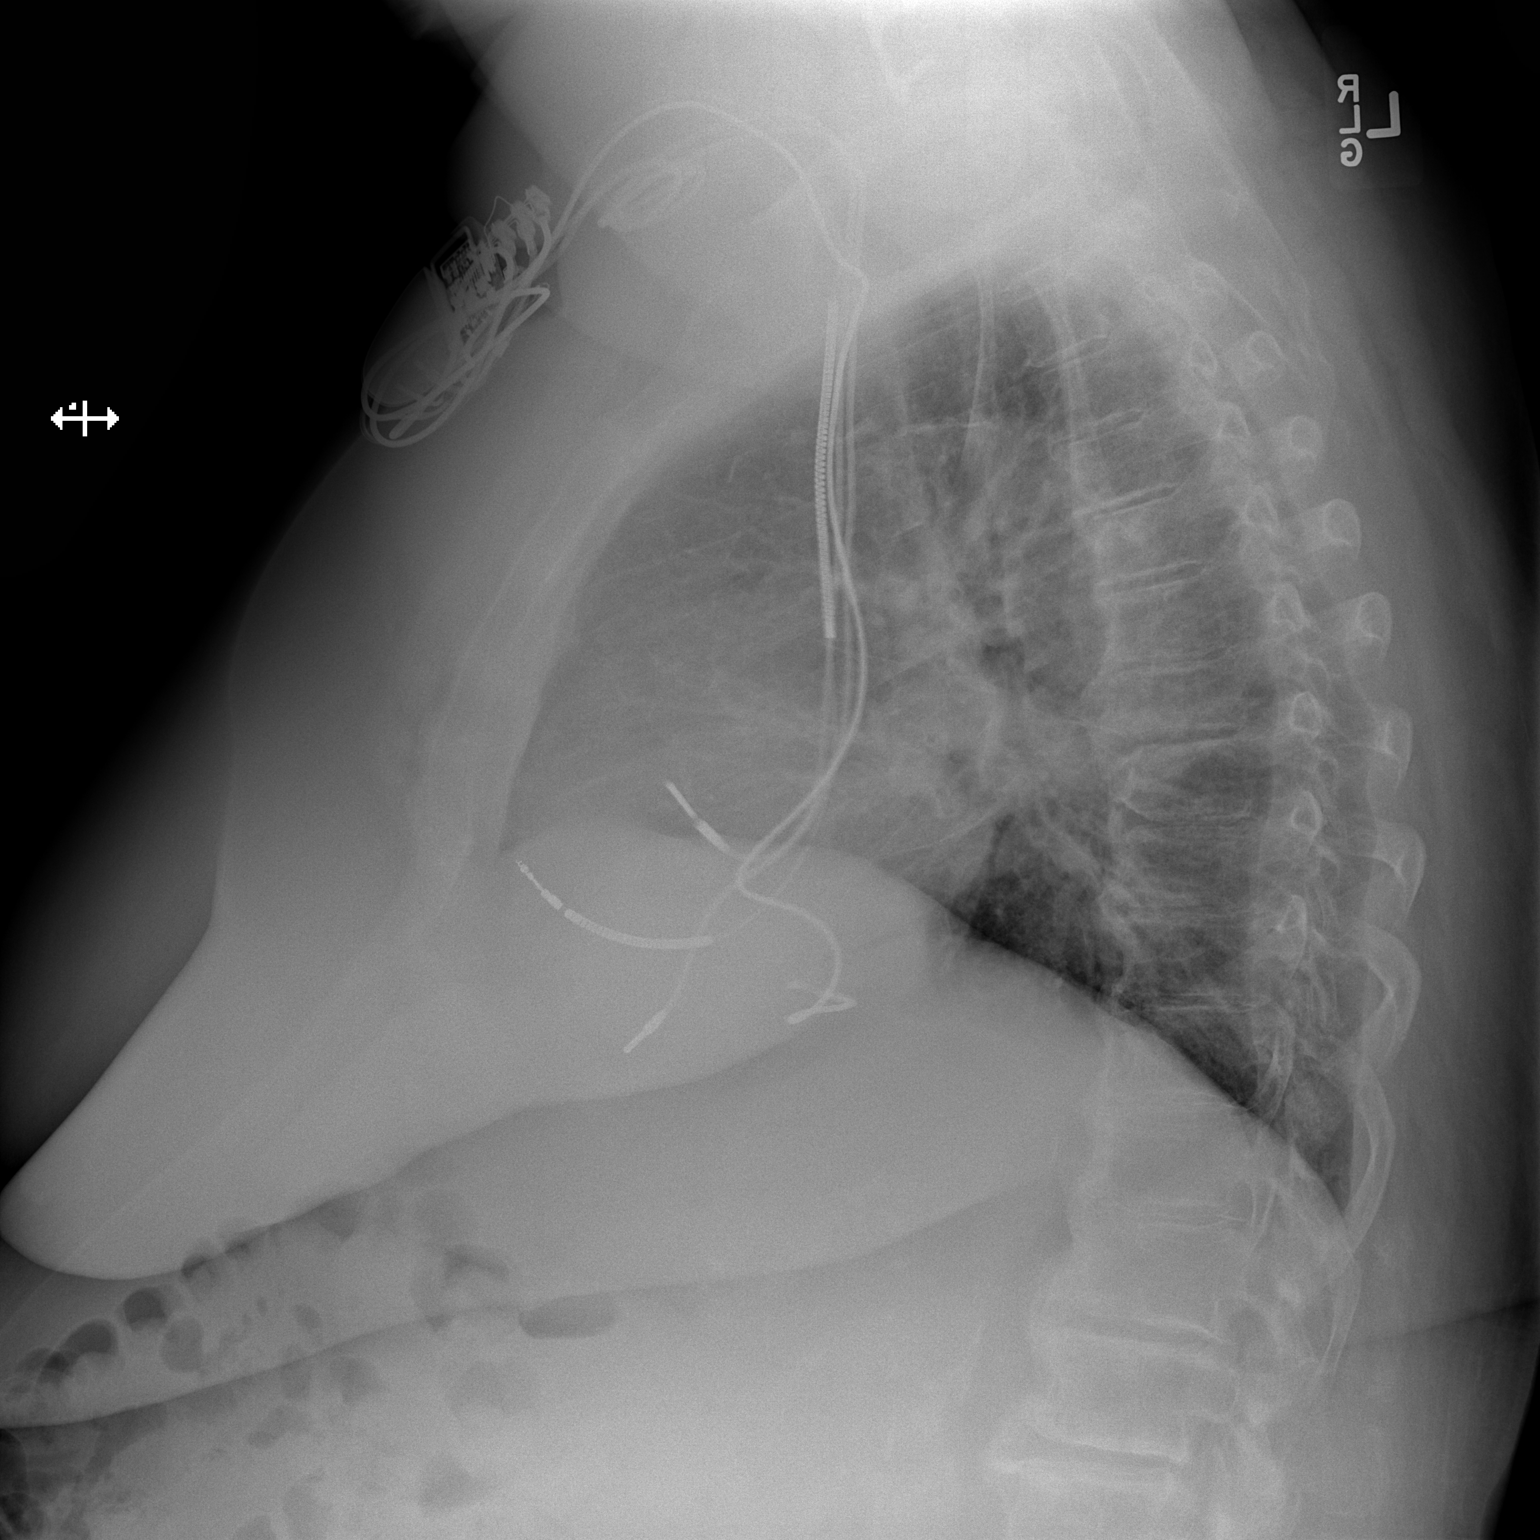

[2 of 2 positions shown; findings below may reference images not displayed]

FINDINGS: Mild cardiomegaly stable. AICD remains in stable position. Mild
scarring and left lower lung is unchanged. No evidence of acute
infiltrate or edema. No evidence of pleural effusion. Ectasia of the
thoracic aorta is stable.
IMPRESSION: Stable mild cardiomegaly.  No active lung disease.

## 2014-11-14 NOTE — Telephone Encounter (Signed)
Received refill request for bisprolol 5 mg from [facility]. rx last sent on 10/12/14 for 30 tablets with no refills. Comments defer to PCP for further refills. Spoke with [facility] pharmacy, they will defer refill request to PCP Dr.Gates and call us back if needed.

## 2014-11-23 ENCOUNTER — Inpatient Hospital Stay (HOSPITAL_COMMUNITY): Payer: Medicare Other

## 2014-11-23 ENCOUNTER — Telehealth: Payer: Self-pay | Admitting: Cardiovascular Disease

## 2014-11-23 ENCOUNTER — Other Ambulatory Visit: Payer: Self-pay | Admitting: Internal Medicine

## 2014-11-23 ENCOUNTER — Encounter: Payer: Self-pay | Admitting: Cardiology

## 2014-11-23 ENCOUNTER — Ambulatory Visit (INDEPENDENT_AMBULATORY_CARE_PROVIDER_SITE_OTHER): Payer: Medicare Other | Admitting: Cardiology

## 2014-11-23 ENCOUNTER — Inpatient Hospital Stay (HOSPITAL_COMMUNITY)
Admission: AD | Admit: 2014-11-23 | Discharge: 2014-12-07 | DRG: 233 | Disposition: A | Discharge status: Skilled Nursing Facility | Payer: Medicare Other | Source: Ambulatory Visit | Attending: Thoracic Surgery (Cardiothoracic Vascular Surgery) | Admitting: Thoracic Surgery (Cardiothoracic Vascular Surgery)

## 2014-11-23 VITALS — BP 102/58 | HR 99 | Ht 64.0 in | Wt 249.4 lb

## 2014-11-23 DIAGNOSIS — Z803 Family history of malignant neoplasm of breast: Secondary | ICD-10-CM

## 2014-11-23 DIAGNOSIS — Z833 Family history of diabetes mellitus: Secondary | ICD-10-CM

## 2014-11-23 DIAGNOSIS — I428 Other cardiomyopathies: Secondary | ICD-10-CM | POA: Diagnosis present

## 2014-11-23 DIAGNOSIS — Z95810 Presence of automatic (implantable) cardiac defibrillator: Secondary | ICD-10-CM | POA: Diagnosis not present

## 2014-11-23 DIAGNOSIS — I441 Atrioventricular block, second degree: Secondary | ICD-10-CM

## 2014-11-23 DIAGNOSIS — R51 Headache: Secondary | ICD-10-CM | POA: Diagnosis not present

## 2014-11-23 DIAGNOSIS — R0902 Hypoxemia: Secondary | ICD-10-CM | POA: Diagnosis not present

## 2014-11-23 DIAGNOSIS — I249 Acute ischemic heart disease, unspecified: Secondary | ICD-10-CM | POA: Diagnosis not present

## 2014-11-23 DIAGNOSIS — Z87891 Personal history of nicotine dependence: Secondary | ICD-10-CM | POA: Diagnosis not present

## 2014-11-23 DIAGNOSIS — T82120A Displacement of cardiac electrode, initial encounter: Secondary | ICD-10-CM | POA: Diagnosis present

## 2014-11-23 DIAGNOSIS — Z8249 Family history of ischemic heart disease and other diseases of the circulatory system: Secondary | ICD-10-CM | POA: Diagnosis not present

## 2014-11-23 DIAGNOSIS — K219 Gastro-esophageal reflux disease without esophagitis: Secondary | ICD-10-CM | POA: Diagnosis present

## 2014-11-23 DIAGNOSIS — Z881 Allergy status to other antibiotic agents status: Secondary | ICD-10-CM

## 2014-11-23 DIAGNOSIS — T82110A Breakdown (mechanical) of cardiac electrode, initial encounter: Secondary | ICD-10-CM | POA: Diagnosis not present

## 2014-11-23 DIAGNOSIS — E7800 Pure hypercholesterolemia, unspecified: Secondary | ICD-10-CM | POA: Diagnosis present

## 2014-11-23 DIAGNOSIS — D62 Acute posthemorrhagic anemia: Secondary | ICD-10-CM | POA: Diagnosis not present

## 2014-11-23 DIAGNOSIS — I200 Unstable angina: Secondary | ICD-10-CM

## 2014-11-23 DIAGNOSIS — Z01810 Encounter for preprocedural cardiovascular examination: Secondary | ICD-10-CM | POA: Diagnosis not present

## 2014-11-23 DIAGNOSIS — I2510 Atherosclerotic heart disease of native coronary artery without angina pectoris: Secondary | ICD-10-CM | POA: Insufficient documentation

## 2014-11-23 DIAGNOSIS — Z951 Presence of aortocoronary bypass graft: Secondary | ICD-10-CM | POA: Diagnosis not present

## 2014-11-23 DIAGNOSIS — J9811 Atelectasis: Secondary | ICD-10-CM | POA: Diagnosis not present

## 2014-11-23 DIAGNOSIS — I5023 Acute on chronic systolic (congestive) heart failure: Secondary | ICD-10-CM | POA: Diagnosis not present

## 2014-11-23 DIAGNOSIS — I509 Heart failure, unspecified: Secondary | ICD-10-CM

## 2014-11-23 DIAGNOSIS — K449 Diaphragmatic hernia without obstruction or gangrene: Secondary | ICD-10-CM | POA: Diagnosis present

## 2014-11-23 DIAGNOSIS — I5033 Acute on chronic diastolic (congestive) heart failure: Secondary | ICD-10-CM | POA: Diagnosis present

## 2014-11-23 DIAGNOSIS — R0602 Shortness of breath: Secondary | ICD-10-CM | POA: Diagnosis not present

## 2014-11-23 DIAGNOSIS — Y831 Surgical operation with implant of artificial internal device as the cause of abnormal reaction of the patient, or of later complication, without mention of misadventure at the time of the procedure: Secondary | ICD-10-CM | POA: Diagnosis present

## 2014-11-23 DIAGNOSIS — R079 Chest pain, unspecified: Secondary | ICD-10-CM | POA: Diagnosis present

## 2014-11-23 DIAGNOSIS — Z882 Allergy status to sulfonamides status: Secondary | ICD-10-CM | POA: Diagnosis not present

## 2014-11-23 DIAGNOSIS — J45909 Unspecified asthma, uncomplicated: Secondary | ICD-10-CM | POA: Diagnosis present

## 2014-11-23 DIAGNOSIS — I429 Cardiomyopathy, unspecified: Secondary | ICD-10-CM

## 2014-11-23 DIAGNOSIS — N179 Acute kidney failure, unspecified: Secondary | ICD-10-CM | POA: Diagnosis present

## 2014-11-23 DIAGNOSIS — K760 Fatty (change of) liver, not elsewhere classified: Secondary | ICD-10-CM | POA: Diagnosis present

## 2014-11-23 DIAGNOSIS — I959 Hypotension, unspecified: Secondary | ICD-10-CM | POA: Diagnosis not present

## 2014-11-23 DIAGNOSIS — K589 Irritable bowel syndrome without diarrhea: Secondary | ICD-10-CM | POA: Diagnosis present

## 2014-11-23 DIAGNOSIS — Z91041 Radiographic dye allergy status: Secondary | ICD-10-CM

## 2014-11-23 DIAGNOSIS — Z825 Family history of asthma and other chronic lower respiratory diseases: Secondary | ICD-10-CM | POA: Diagnosis not present

## 2014-11-23 DIAGNOSIS — E8779 Other fluid overload: Secondary | ICD-10-CM | POA: Diagnosis not present

## 2014-11-23 DIAGNOSIS — Z9861 Coronary angioplasty status: Secondary | ICD-10-CM

## 2014-11-23 DIAGNOSIS — T82190A Other mechanical complication of cardiac electrode, initial encounter: Secondary | ICD-10-CM | POA: Diagnosis not present

## 2014-11-23 DIAGNOSIS — I110 Hypertensive heart disease with heart failure: Secondary | ICD-10-CM | POA: Diagnosis present

## 2014-11-23 DIAGNOSIS — R0789 Other chest pain: Secondary | ICD-10-CM

## 2014-11-23 DIAGNOSIS — Z91013 Allergy to seafood: Secondary | ICD-10-CM

## 2014-11-23 DIAGNOSIS — N289 Disorder of kidney and ureter, unspecified: Secondary | ICD-10-CM | POA: Diagnosis not present

## 2014-11-23 DIAGNOSIS — K5900 Constipation, unspecified: Secondary | ICD-10-CM | POA: Diagnosis not present

## 2014-11-23 DIAGNOSIS — I2584 Coronary atherosclerosis due to calcified coronary lesion: Secondary | ICD-10-CM | POA: Diagnosis present

## 2014-11-23 DIAGNOSIS — Z888 Allergy status to other drugs, medicaments and biological substances status: Secondary | ICD-10-CM

## 2014-11-23 DIAGNOSIS — G4733 Obstructive sleep apnea (adult) (pediatric): Secondary | ICD-10-CM | POA: Diagnosis present

## 2014-11-23 DIAGNOSIS — Z6841 Body Mass Index (BMI) 40.0 and over, adult: Secondary | ICD-10-CM

## 2014-11-23 DIAGNOSIS — E781 Pure hyperglyceridemia: Secondary | ICD-10-CM | POA: Diagnosis present

## 2014-11-23 DIAGNOSIS — I25110 Atherosclerotic heart disease of native coronary artery with unstable angina pectoris: Principal | ICD-10-CM | POA: Diagnosis present

## 2014-11-23 DIAGNOSIS — Z9689 Presence of other specified functional implants: Secondary | ICD-10-CM

## 2014-11-23 DIAGNOSIS — E785 Hyperlipidemia, unspecified: Secondary | ICD-10-CM | POA: Diagnosis present

## 2014-11-23 DIAGNOSIS — E119 Type 2 diabetes mellitus without complications: Secondary | ICD-10-CM | POA: Diagnosis present

## 2014-11-23 DIAGNOSIS — I5031 Acute diastolic (congestive) heart failure: Secondary | ICD-10-CM | POA: Diagnosis not present

## 2014-11-23 HISTORY — DX: Obstructive sleep apnea (adult) (pediatric): G47.33

## 2014-11-23 LAB — PROTIME-INR
INR: 1.01 (ref 0.00–1.49)
Prothrombin Time: 13.5 seconds (ref 11.6–15.2)

## 2014-11-23 LAB — COMPREHENSIVE METABOLIC PANEL
ALT: 27 U/L (ref 14–54)
AST: 24 U/L (ref 15–41)
Albumin: 3.6 g/dL (ref 3.5–5.0)
Alkaline Phosphatase: 86 U/L (ref 38–126)
Anion gap: 9 (ref 5–15)
BUN: 15 mg/dL (ref 6–20)
CO2: 28 mmol/L (ref 22–32)
Calcium: 9.7 mg/dL (ref 8.9–10.3)
Chloride: 104 mmol/L (ref 101–111)
Creatinine, Ser: 1.2 mg/dL — ABNORMAL HIGH (ref 0.44–1.00)
GFR calc Af Amer: 52 mL/min — ABNORMAL LOW (ref >60)
GFR calc non Af Amer: 45 mL/min — ABNORMAL LOW (ref >60)
Glucose, Bld: 128 mg/dL — ABNORMAL HIGH (ref 65–99)
Potassium: 4.3 mmol/L (ref 3.5–5.1)
Sodium: 141 mmol/L (ref 135–145)
Total Bilirubin: 0.5 mg/dL (ref 0.3–1.2)
Total Protein: 7.2 g/dL (ref 6.5–8.1)

## 2014-11-23 LAB — CBC WITH DIFFERENTIAL/PLATELET
Basophils Absolute: 0.1 10*3/uL (ref 0.0–0.1)
Basophils Relative: 1 %
Eosinophils Absolute: 0.2 10*3/uL (ref 0.0–0.7)
Eosinophils Relative: 2 %
HCT: 45.6 % (ref 36.0–46.0)
Hemoglobin: 14.5 g/dL (ref 12.0–15.0)
Lymphocytes Relative: 21 %
Lymphs Abs: 1.9 10*3/uL (ref 0.7–4.0)
MCH: 28.4 pg (ref 26.0–34.0)
MCHC: 31.8 g/dL (ref 30.0–36.0)
MCV: 89.4 fL (ref 78.0–100.0)
Monocytes Absolute: 0.6 10*3/uL (ref 0.1–1.0)
Monocytes Relative: 6 %
Neutro Abs: 6.5 10*3/uL (ref 1.7–7.7)
Neutrophils Relative %: 70 %
Platelets: 212 10*3/uL (ref 150–400)
RBC: 5.1 MIL/uL (ref 3.87–5.11)
RDW: 13.7 % (ref 11.5–15.5)
WBC: 9.2 10*3/uL (ref 4.0–10.5)

## 2014-11-23 LAB — BRAIN NATRIURETIC PEPTIDE: B Natriuretic Peptide: 68 pg/mL (ref 0.0–100.0)

## 2014-11-23 LAB — APTT: aPTT: 31 seconds (ref 24–37)

## 2014-11-23 LAB — TSH: TSH: 2.505 u[IU]/mL (ref 0.350–4.500)

## 2014-11-23 LAB — MAGNESIUM: Magnesium: 2 mg/dL (ref 1.7–2.4)

## 2014-11-23 LAB — TROPONIN I
Troponin I: <0.03 ng/mL (ref <0.031)
Troponin I: <0.03 ng/mL (ref <0.031)

## 2014-11-23 MED ORDER — SODIUM CHLORIDE 0.9 % IV SOLN
INTRAVENOUS | Status: DC
  Administered 2014-11-23: 23:00:00 via INTRAVENOUS

## 2014-11-23 MED ORDER — FLUTICASONE PROPIONATE 50 MCG/ACT NA SUSP: 1.0000 | Freq: Every day | NASAL | Status: DC

## 2014-11-23 MED ORDER — DICYCLOMINE HCL 20 MG PO TABS: 20.0000 mg | ORAL_TABLET | Freq: Two times a day (BID) | ORAL | Status: DC | PRN

## 2014-11-23 MED ORDER — ALPRAZOLAM 0.25 MG PO TABS
0.2500 mg | ORAL_TABLET | Freq: Two times a day (BID) | ORAL | Status: DC | PRN
  Filled 2014-11-23: qty 1

## 2014-11-23 MED ORDER — FUROSEMIDE 10 MG/ML IJ SOLN
80.0000 mg | Freq: Two times a day (BID) | INTRAMUSCULAR | Status: DC
  Administered 2014-11-23 – 2014-11-24 (×2): 80 mg via INTRAVENOUS
  Filled 2014-11-23 (×3): qty 8

## 2014-11-23 MED ORDER — BISOPROLOL FUMARATE 5 MG PO TABS
5.0000 mg | ORAL_TABLET | Freq: Every day | ORAL | Status: DC
  Administered 2014-11-23 – 2014-11-28 (×6): 5 mg via ORAL
  Filled 2014-11-23 (×6): qty 1

## 2014-11-23 MED ORDER — CLOTRIMAZOLE 10 MG MT TROC: 10.0000 mg | OROMUCOSAL | Status: DC | PRN

## 2014-11-23 MED ORDER — POTASSIUM CHLORIDE CRYS ER 20 MEQ PO TBCR
20.0000 meq | EXTENDED_RELEASE_TABLET | Freq: Every day | ORAL | Status: DC
  Administered 2014-11-24 – 2014-11-28 (×5): 20 meq via ORAL
  Filled 2014-11-23 (×7): qty 1

## 2014-11-23 MED ORDER — SALINE SPRAY 0.65 % NA SOLN
2.0000 | NASAL | Status: DC | PRN
  Filled 2014-11-23 (×2): qty 44

## 2014-11-23 MED ORDER — ALUM & MAG HYDROXIDE-SIMETH 200-200-20 MG/5ML PO SUSP
30.0000 mL | ORAL | Status: DC | PRN
  Administered 2014-11-28: 30 mL via ORAL
  Filled 2014-11-23: qty 30

## 2014-11-23 MED ORDER — LORATADINE 10 MG PO TABS
10.0000 mg | ORAL_TABLET | Freq: Every day | ORAL | Status: DC
  Administered 2014-11-24 – 2014-11-28 (×5): 10 mg via ORAL
  Filled 2014-11-23 (×5): qty 1

## 2014-11-23 MED ORDER — AMLODIPINE BESYLATE 10 MG PO TABS
10.0000 mg | ORAL_TABLET | Freq: Every day | ORAL | Status: DC
  Administered 2014-11-23 – 2014-11-28 (×6): 10 mg via ORAL
  Filled 2014-11-23 (×6): qty 1

## 2014-11-23 MED ORDER — DOCUSATE SODIUM 100 MG PO CAPS
100.0000 mg | ORAL_CAPSULE | Freq: Every day | ORAL | Status: DC
  Administered 2014-11-23 – 2014-11-28 (×6): 100 mg via ORAL
  Filled 2014-11-23 (×7): qty 1

## 2014-11-23 MED ORDER — ACETAMINOPHEN 325 MG PO TABS
650.0000 mg | ORAL_TABLET | ORAL | Status: DC | PRN
  Administered 2014-11-25 – 2014-11-26 (×3): 650 mg via ORAL
  Filled 2014-11-23 (×4): qty 2

## 2014-11-23 MED ORDER — LOSARTAN POTASSIUM 50 MG PO TABS: 100.0000 mg | ORAL_TABLET | Freq: Every day | ORAL | Status: DC

## 2014-11-23 MED ORDER — ASPIRIN 81 MG PO CHEW
324.0000 mg | CHEWABLE_TABLET | ORAL | Status: AC
  Administered 2014-11-23: 324 mg via ORAL
  Filled 2014-11-23: qty 4

## 2014-11-23 MED ORDER — ASPIRIN 81 MG PO CHEW
81.0000 mg | CHEWABLE_TABLET | Freq: Every day | ORAL | Status: DC
Start: 2014-11-23 — End: 2014-11-23

## 2014-11-23 MED ORDER — ZOLPIDEM TARTRATE 5 MG PO TABS
5.0000 mg | ORAL_TABLET | Freq: Every evening | ORAL | Status: DC | PRN
  Filled 2014-11-23: qty 1

## 2014-11-23 MED ORDER — PANTOPRAZOLE SODIUM 40 MG PO TBEC
80.0000 mg | DELAYED_RELEASE_TABLET | Freq: Every day | ORAL | Status: DC
  Administered 2014-11-24 – 2014-11-28 (×5): 80 mg via ORAL
  Filled 2014-11-23 (×5): qty 2

## 2014-11-23 MED ORDER — NITROGLYCERIN 0.4 MG SL SUBL: 0.4000 mg | SUBLINGUAL_TABLET | SUBLINGUAL | Status: DC | PRN

## 2014-11-23 MED ORDER — ASPIRIN 300 MG RE SUPP
300.0000 mg | RECTAL | Status: AC
  Filled 2014-11-23: qty 1

## 2014-11-23 MED ORDER — SPIRONOLACTONE 25 MG PO TABS
12.5000 mg | ORAL_TABLET | Freq: Two times a day (BID) | ORAL | Status: DC
  Administered 2014-11-23 – 2014-11-28 (×11): 12.5 mg via ORAL
  Filled 2014-11-23 (×11): qty 1

## 2014-11-23 MED ORDER — HEPARIN BOLUS VIA INFUSION
4000.0000 [IU] | Freq: Once | INTRAVENOUS | Status: AC
  Administered 2014-11-23: 4000 [IU] via INTRAVENOUS
  Filled 2014-11-23: qty 4000

## 2014-11-23 MED ORDER — NITROGLYCERIN 2 % TD OINT
1.0000 [in_us] | TOPICAL_OINTMENT | Freq: Four times a day (QID) | TRANSDERMAL | Status: DC
Start: 2014-11-23 — End: 2014-11-24
  Administered 2014-11-23 – 2014-11-24 (×3): 1 [in_us] via TOPICAL
  Filled 2014-11-23: qty 30

## 2014-11-23 MED ORDER — PRAVASTATIN SODIUM 40 MG PO TABS
80.0000 mg | ORAL_TABLET | Freq: Every evening | ORAL | Status: DC
  Administered 2014-11-23 – 2014-12-06 (×13): 80 mg via ORAL
  Filled 2014-11-23: qty 2
  Filled 2014-11-23: qty 1
  Filled 2014-11-23 (×4): qty 2
  Filled 2014-11-23: qty 1
  Filled 2014-11-23 (×2): qty 2
  Filled 2014-11-23 (×2): qty 1
  Filled 2014-11-23: qty 2
  Filled 2014-11-23 (×2): qty 1
  Filled 2014-11-23 (×2): qty 2

## 2014-11-23 MED ORDER — ALBUTEROL SULFATE (2.5 MG/3ML) 0.083% IN NEBU: 2.5000 mg | INHALATION_SOLUTION | RESPIRATORY_TRACT | Status: DC | PRN

## 2014-11-23 MED ORDER — HEPARIN (PORCINE) IN NACL 100-0.45 UNIT/ML-% IJ SOLN
1400.0000 [IU]/h | INTRAMUSCULAR | Status: DC
  Administered 2014-11-23 (×2): 1100 [IU]/h via INTRAVENOUS
  Filled 2014-11-23: qty 250

## 2014-11-23 MED ORDER — ASPIRIN EC 81 MG PO TBEC
81.0000 mg | DELAYED_RELEASE_TABLET | Freq: Every day | ORAL | Status: DC
  Administered 2014-11-24 – 2014-11-28 (×5): 81 mg via ORAL
  Filled 2014-11-23 (×5): qty 1

## 2014-11-23 MED ORDER — ONDANSETRON HCL 4 MG/2ML IJ SOLN: 4.0000 mg | Freq: Four times a day (QID) | INTRAMUSCULAR | Status: DC | PRN

## 2014-11-23 MED ORDER — CEPHALEXIN 500 MG PO CAPS: 500.0000 mg | ORAL_CAPSULE | Freq: Four times a day (QID) | ORAL | Status: DC

## 2014-11-23 MED ORDER — LORATADINE 10 MG PO TABS: 10.0000 mg | ORAL_TABLET | Freq: Every day | ORAL | Status: DC

## 2014-11-23 MED ORDER — BUDESONIDE-FORMOTEROL FUMARATE 160-4.5 MCG/ACT IN AERO
2.0000 | INHALATION_SPRAY | Freq: Two times a day (BID) | RESPIRATORY_TRACT | Status: DC
  Administered 2014-11-23 – 2014-12-07 (×25): 2 via RESPIRATORY_TRACT
  Filled 2014-11-23 (×2): qty 6

## 2014-11-23 NOTE — Telephone Encounter (Signed)
Returned call to patient.She stated she is sob,increase swelling both feet and lower legs.Has gained 3 to 4 lbs this week.Stated she is also having weak spells and wants to be seen today.Appointment scheduled with Cecilie Kicks NP today at 3:00 pm at [facility] office.

## 2014-11-23 NOTE — Progress Notes (Signed)
Cardiology Office Note   Date:  11/23/2014   ID:  [name], DOB 1945/09/08, MRN [id number]  PCP:  [name], MD  Cardiologist:   Dr. [name]   Chief Complaint  Patient presents with  . Shortness of Breath    began 2-3 weeks ago but increased in 2 days.  . Chest Pain      History of Present Illness: [name] is a 69 y.o. female who presents for increasing SOB and chest pain.  These symptoms began 2-3 weeks ago but has increased in last 2 days.  Chest pain occurs with exertion and increased SOB, no nausea or diaphoresis.  This AM she was awakened from sleep at 0330 with SOB and chest pain.  Currently only with exertion do these occur.  She feels her abd is enlarged, no lower ext edema.   She has hx of CAD non obstructive with last cath 2014.  Last echo 2015 with improved EF.  She has not taken any of the prn metolazone.  She denies increase of salt.  Other hx of BiV pacer with [employer].  HTN, OSA but intolerant to CPAP.  Also recent bowel infection neg. c diff, no further diarrhea, completing antibiotics.     Past Medical History  Diagnosis Date  . Asthma   . Hypertension   . Arthritis   . GERD (gastroesophageal reflux disease)   . Diverticulosis   . Anemia   . CAD (coronary artery disease)   . Fatty liver disease, nonalcoholic   . Internal hemorrhoids   . Gastritis   . Hyperplastic colon polyp   . Sleep apnea   . ICD (implantable cardiac defibrillator) in place   . Shortness of breath   . IBS (irritable bowel syndrome)   . Anxiety   . CHF (congestive heart failure) ([place])   . H/O hiatal hernia   . Obesity   . Back pain   . Diabetes mellitus without complication [facility])     Past Surgical History  Procedure Laterality Date  . Pacemaker insertion  2001    BS BivICD  . Cardiac defibrillator placement    . Appendectomy    . Knee surgery      right  . Ankle surgery      right  . Tubal ligation    . Cervical spine surgery     plate   . Coronary angiogram  2010  . Abdominal ultrasound  12/01/2011    Peripelvic cysts- #1- 2.4x1.9x2.3cm, #2-1.2x0.9x1.2cm  . Cardiac catheterization  05/17/1999    No significant coronary obstructive disease w/ mild 20% luminal irregularity of the first diag branch of the LAD  . Cardiac catheterization  07/08/2002    No significant CAD, moderately depressed LV systolic function  . Cardiac catheterization Bilateral 04/26/2007    Normal findings, recommend medical therapy  . Cardiac catheterization  02/18/2008    Moderate CAD, would benefit from having a functional study, recommend continue medical therapy  . Cardiac catheterization  07/23/2012    Medical therapy  . Lexiscan myoview  11/14/2011    Mild-moderate defect seen in Mid Inferolateral and Mid Anterolateral regions-consistant w/ infarct/scar. No significant ischemia demonstrated.  . Transthoracic echocardiogram  07/23/2012    EF 55-60%, normal-mild  . Left heart catheterization with coronary angiogram N/A 07/23/2012    Procedure: LEFT HEART CATHETERIZATION WITH CORONARY ANGIOGRAM;  Surgeon: Leonie Man, MD;  Location: [facility] CATH LAB;  Service: Cardiovascular;  Laterality: N/A;  . Biv pacemaker generator  change out N/A 11/02/2012    Procedure: BIV PACEMAKER GENERATOR CHANGE OUT;  Surgeon: Sanda Klein, MD;  Location: [facility] CATH LAB;  Service: Cardiovascular;  Laterality: N/A;     No current outpatient prescriptions on file.   No current facility-administered medications for this visit.    Allergies:   Ivp dye; Shellfish-derived products; Sulfa antibiotics; Iodine; and Zithromax    Social History:  The patient  reports that she quit smoking about 46 years ago. Her smoking use included Cigarettes. She has a .75 pack-year smoking history. She has never used smokeless tobacco. She reports that she does not drink alcohol or use illicit drugs.   Family History:  The patient's family history includes Asthma in her sister; Breast cancer in  her mother; Diabetes in her maternal grandmother and mother; Glaucoma in her maternal aunt; Heart disease in her maternal aunt and maternal grandmother; Kidney disease in her maternal grandmother.    ROS:  General:no colds or fevers, increase of weight  Skin:no rashes or ulcers HEENT:no blurred vision, no congestion CV:see HPI PUL:see HPI GI:nhx diarrhea now resolved, neg c. Diff, no constipation or melena, no indigestion GU:no hematuria, no dysuria MS:no joint pain, no claudication Neuro:no syncope, no lightheadedness Endo:no diabetes, no thyroid disease  Wt Readings from Last 3 Encounters:  11/23/14 249 lb 6.4 oz (113.127 kg)  10/18/14 246 lb (111.585 kg)  07/12/14 245 lb 9.6 oz (111.403 kg)     PHYSICAL EXAM: VS:  BP 102/58 mmHg  Pulse 99  Ht 5\' 4"  (1.626 m)  Wt 249 lb 6.4 oz (113.127 kg)  BMI 42.79 kg/m2  SpO2 93% , BMI Body mass index is 42.79 kg/(m^2). General:Pleasant affect, NAD Skin:Warm and dry, brisk capillary refill HEENT:normocephalic, sclera clear, mucus membranes moist Neck:supple, no JVD, no bruits  Heart:S1S2 RRR without murmur, gallup, rub or click Lungs:diminished in the bases without rales, rhonchi, or wheezes [id number], soft, non tender, + BS, do not palpate liver spleen or masses Ext:tr lower ext edema, 2+ pedal pulses, 2+ radial pulses Neuro:alert and oriented X 3, MAE, follows commands, + facial symmetry    EKG:  EKG is ordered today. The ekg ordered today demonstrates SR with pacing no acute changes.    Recent Labs: 07/07/2014: ALT 27 07/12/2014: BUN 24*; Creatinine, Ser 1.61*; Hemoglobin 14.8; Platelets 203.0; Potassium 4.2; Sodium 137    Lipid Panel    Component Value Date/Time   CHOL 234* 11/16/2013 0938   TRIG 187* 11/16/2013 0938   HDL 49 11/16/2013 0938   CHOLHDL 4.8 11/16/2013 0938   VLDL 37 11/16/2013 0938   LDLCALC 148* 11/16/2013 0938       Other studies Reviewed: Additional studies/ records that were reviewed today  include: EKGs cath echo .  ECHO: Study Conclusions - Procedure narrative: Technically difficult study with reduced endocardial definition. - Left ventricle: The cavity size was normal. Wall thickness is indeterminate. Systolic function was vigorous. The estimated ejection fraction was in the range of 65% to 70%. Doppler parameters are consistent with abnormal left ventricular relaxation (grade 1 diastolic dysfunction). The E/e&' ratio is between 8-15, suggesting indeterminate LV filling pressure. - Left atrium: LA Volume/BSA= 19.7 ml/m2. The atrium was normal in size. - Right ventricle: The cavity size was normal. Wall thickness was normal. Pacer wire or catheter noted in right ventricle. Systolic function was normal. - Right atrium: The atrium was mildly dilated. Pacer wire or catheter noted in right atrium. - Tricuspid valve: There was mild regurgitation. - Pulmonary arteries: PA  peak pressure: 27 mm Hg (S). - Inferior vena cava: The vessel was dilated. The respirophasic diameter changes were blunted (< 50%), consistent with elevated central venous pressure. Diameter: 21.8 mm. Impressions: - Technically difficult study. Contrast may have been helpful. Compared to the prior study in 07/2013, there is likely little change, however, EF is increased.  Cardiac cath: The left main coronary artery had ostial calcification but otherwise was free of obstructive disease and bifurcated into the LAD and left circumflex coronary artery.  The left anterior descending artery gave rise to a proximal diagonal vessel which had 30% ostial narrowing after 2 septal perforating arteries and the second diagonal vessel. The midportion of the LAD had 40-50% tubular narrowing during diastole. However, with systole, there was midsystolic bridging with narrowing up to 80% during systole. There also was mild [id number] systolic bridging in the midportion of the second diagonal vessel. The  remainder of the LAD was free of significant disease and extended to and wrapped around the LAD.  The left circumflex coronary artery gave rise to a proximal marginal vessel. After a superior branch arising from this marginal vessel there was 20% narrowing in this OM1 vessel. The remainder of the circumflex was free of significant disease and supplied the distal marginal and PLA -like vessel.  The right coronary artery had some 20-30% narrowing in its midsegment. There was 20% narrowing in the mid PDA. The distal circumflex were normal.  ASSESSMENT AND PLAN:  1.  Unstable angina due to CAD vs. CHF  EF a year ago with EF 65-70% with G1DD. This is with biV pacer.  Will admit to tele and diuresis - further cardiac eval dependent on  Troponin, nuc vs. Cardiac cath. Will check serial troponins, begin IV heaprin and NTG paste.  Will recheck echo.  2. CAD with last cath 2014 with non obstructive disease most mLAD with 40-50% tubular narrowing with systole there was midsystolic bridging with narrowing up to 80%. .      3. Recent bowel infections on antibiotics.  4. OSA does not tolerate cpap.   5 BIv Pacer [employer].   Dr. Irish Lack has seen and examined the pt.     Current medicines are reviewed with the patient today.  The patient Has no concerns regarding medicines.  The following changes have been made:  See above Labs/ tests ordered today include:see above  Disposition:   FU:  see above  Lennie Muckle, NP  11/23/2014 4:37 PM    [facility] [place], [place], [place], [place] Phone: [phone]; Fax: [phone]

## 2014-11-23 NOTE — Progress Notes (Signed)
ANTICOAGULATION CONSULT NOTE - Initial Consult  Pharmacy Consult for Heparin Indication: chest pain/ACS  Allergies  Allergen Reactions  . Ivp Dye [Iodinated Diagnostic Agents] Shortness Of Breath    No reaction to PO contrast with non-ionic dye.06-25-2014/rsm  . Shellfish-Derived Products Anaphylaxis  . Sulfa Antibiotics Shortness Of Breath  . Iodine Hives  . Zithromax [Azithromycin] Rash    Patient Measurements: Height: 5\' 5"  (165.1 cm) Weight: 246 lb 11.2 oz (111.902 kg) (scale c) IBW/kg (Calculated) : 57 Heparin Dosing Weight: 85kg  Vital Signs: Temp: 98.2 F (36.8 C) (10/13 1700) Temp Source: Oral (10/13 1700) BP: 124/72 mmHg (10/13 1700) Pulse Rate: 102 (10/13 1700)  Labs:  Recent Labs  11/23/14 1744  HGB 14.5  HCT 45.6  PLT 212    CrCl cannot be calculated (Patient has no serum creatinine result on file.).   Medical History: Past Medical History  Diagnosis Date  . Asthma   . Hypertension   . Arthritis   . GERD (gastroesophageal reflux disease)   . Diverticulosis   . Anemia   . CAD (coronary artery disease)   . Fatty liver disease, nonalcoholic   . Internal hemorrhoids   . Gastritis   . Hyperplastic colon polyp   . Sleep apnea   . ICD (implantable cardiac defibrillator) in place   . Shortness of breath   . IBS (irritable bowel syndrome)   . Anxiety   . CHF (congestive heart failure) ([place])   . H/O hiatal hernia   . Obesity   . Back pain   . Diabetes mellitus without complication ([place])      Assessment: 69yo obese female with Hx NICM with improved EF 15> 60%, ICD, non-obstructive CAD per cath 2014 admitted with new onset CP and SOB with exertion that has worsened over the last 2 weeks. Plan for  Serial Troponin and possible cath.  Begin heparin drip after BL labs drawn.    Goal of Therapy:  Heparin level 0.3-0.7 units/ml Monitor platelets by anticoagulation protocol: Yes   Plan:  Heparin bolus 4000 uts IV x1 then heparin drip 1100 uts/hr   Draw HL 6hr after start and daily with CBC   Bonnita Nasuti Pharm.D. CPP, BCPS Clinical Pharmacist [phone] 11/23/2014 6:24 PM

## 2014-11-23 NOTE — Patient Instructions (Addendum)
Pt was sent to [facility] or inpatient

## 2014-11-23 NOTE — Telephone Encounter (Signed)
Pt called in stating that she has been having some SOB w/ exertion and some chest discomfort for about a week. She would like to come in and be seen as soon as possible. Please call  Thanks

## 2014-11-23 NOTE — H&P (Signed)
[name] is an 69 y.o. female.    Primary Cardiologist:  Dr. [name]  PCP: [name], MD  Chief Complaint  Patient presents with  . Shortness of Breath    began 2-3 weeks ago but increased in 2 days.  . Chest Pain     History of Present Illness: [name] is a 69 y.o. female who presents for increasing SOB and chest pain. These symptoms began 2-3 weeks ago but has increased in last 2 days. Chest pain occurs with exertion and increased SOB, no nausea or diaphoresis. This AM she was awakened from sleep at 0330 with SOB and chest pain. Currently only with exertion do these occur. She feels her abd is enlarged, no lower ext edema.   She has hx of CAD non obstructive with last cath 2014. Last echo 2015 with improved EF. She has not taken any of the prn metolazone. She denies increase of salt. Other hx of BiV pacer with [employer]. HTN, OSA but intolerant to CPAP. Also recent bowel infection neg. c diff, no further diarrhea, completing antibiotics.    Past Medical History  Diagnosis Date  . Asthma   . Hypertension   . Arthritis   . GERD (gastroesophageal reflux disease)   . Diverticulosis   . Anemia   . CAD (coronary artery disease)   . Fatty liver disease, nonalcoholic   . Internal hemorrhoids   . Gastritis   . Hyperplastic colon polyp   . Sleep apnea   . ICD (implantable cardiac defibrillator) in place   . Shortness of breath   . IBS (irritable bowel syndrome)   . Anxiety   . CHF (congestive heart failure) ([place])   . H/O hiatal hernia   . Obesity   . Back pain   . Diabetes mellitus without complication [facility])     Past Surgical History  Procedure Laterality Date  . Pacemaker insertion  2001    BS BivICD  . Cardiac defibrillator placement    . Appendectomy    . Knee surgery      right  . Ankle surgery        right  . Tubal ligation    . Cervical spine surgery      plate   . Coronary angiogram  2010  . Abdominal ultrasound  12/01/2011    Peripelvic cysts- #1- 2.4x1.9x2.3cm, #2-1.2x0.9x1.2cm  . Cardiac catheterization  05/17/1999    No significant coronary obstructive disease w/ mild 20% luminal irregularity of the first diag branch of the LAD  . Cardiac catheterization  07/08/2002    No significant CAD, moderately depressed LV systolic function  . Cardiac catheterization Bilateral 04/26/2007    Normal findings, recommend medical therapy  . Cardiac catheterization  02/18/2008    Moderate CAD, would benefit from having a functional study, recommend continue medical therapy  . Cardiac catheterization  07/23/2012    Medical therapy  . Lexiscan myoview  11/14/2011    Mild-moderate defect seen in Mid Inferolateral and Mid Anterolateral regions-consistant w/ infarct/scar. No significant ischemia demonstrated.  . Transthoracic echocardiogram  07/23/2012    EF 55-60%, normal-mild  . Left heart catheterization with coronary angiogram N/A 07/23/2012    Procedure: LEFT HEART CATHETERIZATION WITH CORONARY ANGIOGRAM; Surgeon: Leonie Man, MD; Location: [facility] CATH LAB; Service: Cardiovascular; Laterality: N/A;  . Biv pacemaker generator change out N/A 11/02/2012    Procedure: BIV PACEMAKER GENERATOR CHANGE OUT; Surgeon: Sanda Klein, MD; Location: [facility]  CATH LAB; Service: Cardiovascular; Laterality: N/A;     No current outpatient prescriptions on file.   No current facility-administered medications for this visit.    Allergies: Ivp dye; Shellfish-derived products; Sulfa antibiotics; Iodine; and Zithromax    Social History: The patient  reports that she quit smoking about 46 years ago. Her smoking use included Cigarettes. She has a .75 pack-year smoking history. She has never used smokeless tobacco. She  reports that she does not drink alcohol or use illicit drugs.   Family History: The patient's family history includes Asthma in her sister; Breast cancer in her mother; Diabetes in her maternal grandmother and mother; Glaucoma in her maternal aunt; Heart disease in her maternal aunt and maternal grandmother; Kidney disease in her maternal grandmother.    ROS: General:no colds or fevers, increase of weight  Skin:no rashes or ulcers HEENT:no blurred vision, no congestion CV:see HPI PUL:see HPI GI:nhx diarrhea now resolved, neg c. Diff, no constipation or melena, no indigestion GU:no hematuria, no dysuria MS:no joint pain, no claudication Neuro:no syncope, no lightheadedness Endo:no diabetes, no thyroid disease  Wt Readings from Last 3 Encounters:  11/23/14 249 lb 6.4 oz (113.127 kg)  10/18/14 246 lb (111.585 kg)  07/12/14 245 lb 9.6 oz (111.403 kg)     PHYSICAL EXAM: VS: BP 102/58 mmHg  Pulse 99  Ht 5\' 4"  (1.626 m)  Wt 249 lb 6.4 oz (113.127 kg)  BMI 42.79 kg/m2  SpO2 93% , BMI Body mass index is 42.79 kg/(m^2). General:Pleasant affect, NAD Skin:Warm and dry, brisk capillary refill HEENT:normocephalic, sclera clear, mucus membranes moist Neck:supple, no JVD, no bruits  Heart:S1S2 RRR without murmur, gallup, rub or click Lungs:diminished in the bases without rales, rhonchi, or wheezes [id number], soft, non tender, + BS, do not palpate liver spleen or masses Ext:tr lower ext edema, 2+ pedal pulses, 2+ radial pulses Neuro:alert and oriented X 3, MAE, follows commands, + facial symmetry    EKG: EKG is ordered today. The ekg ordered today demonstrates SR with pacing no acute changes.    Recent Labs: 07/07/2014: ALT 27 07/12/2014: BUN 24*; Creatinine, Ser 1.61*; Hemoglobin 14.8; Platelets 203.0; Potassium 4.2; Sodium 137    Lipid Panel  Labs (Brief)       Component Value Date/Time   CHOL 234* 11/16/2013 0938   TRIG 187* 11/16/2013 0938   HDL  49 11/16/2013 0938   CHOLHDL 4.8 11/16/2013 0938   VLDL 37 11/16/2013 0938   LDLCALC 148* 11/16/2013 [id number]        Other studies Reviewed: Additional studies/ records that were reviewed today include: EKGs cath echo .  ECHO: Study Conclusions - Procedure narrative: Technically difficult study with reduced endocardial definition. - Left ventricle: The cavity size was normal. Wall thickness is indeterminate. Systolic function was vigorous. The estimated ejection fraction was in the range of 65% to 70%. Doppler parameters are consistent with abnormal left ventricular relaxation (grade 1 diastolic dysfunction). The E/e&' ratio is between 8-15, suggesting indeterminate LV filling pressure. - Left atrium: LA Volume/BSA= 19.7 ml/m2. The atrium was normal in size. - Right ventricle: The cavity size was normal. Wall thickness was normal. Pacer wire or catheter noted in right ventricle. Systolic function was normal. - Right atrium: The atrium was mildly dilated. Pacer wire or catheter noted in right atrium. - Tricuspid valve: There was mild regurgitation. - Pulmonary arteries: PA peak pressure: 27 mm Hg (S). - Inferior vena cava: The vessel was dilated. The respirophasic diameter changes were blunted (< 50%), consistent  with elevated central venous pressure. Diameter: 21.8 mm. Impressions: - Technically difficult study. Contrast may have been helpful. Compared to the prior study in 07/2013, there is likely little change, however, EF is increased.  Cardiac cath: The left main coronary artery had ostial calcification but otherwise was free of obstructive disease and bifurcated into the LAD and left circumflex coronary artery.  The left anterior descending artery gave rise to a proximal diagonal vessel which had 30% ostial narrowing after 2 septal perforating arteries and the second diagonal vessel. The midportion of the LAD had 40-50% tubular  narrowing during diastole. However, with systole, there was midsystolic bridging with narrowing up to 80% during systole. There also was mild A999333 systolic bridging in the midportion of the second diagonal vessel. The remainder of the LAD was free of significant disease and extended to and wrapped around the LAD.  The left circumflex coronary artery gave rise to a proximal marginal vessel. After a superior branch arising from this marginal vessel there was 20% narrowing in this OM1 vessel. The remainder of the circumflex was free of significant disease and supplied the distal marginal and PLA -like vessel.  The right coronary artery had some 20-30% narrowing in its midsegment. There was 20% narrowing in the mid PDA. The distal circumflex were normal.  ASSESSMENT AND PLAN:  1. Unstable angina due to CAD vs. CHF EF a year ago with EF 65-70% with G1DD. This is with biV pacer. Will admit to tele and diuresis - further cardiac eval dependent on Troponin, nuc vs. Cardiac cath. Will check serial troponins, begin IV heaprin and NTG paste. Will recheck echo.  2. CAD with last cath 2014 with non obstructive disease most mLAD with 40-50% tubular narrowing with systole there was midsystolic bridging with narrowing up to 80%. .    3. Recent bowel infections on antibiotics.  4. OSA does not tolerate cpap.   5 BIv Pacer [employer].   Dr. Irish Lack has seen and examined the pt.    Current medicines are reviewed with the patient today. The patient Has no concerns regarding medicines.  The following changes have been made: See above Labs/ tests ordered today include:see above  Disposition: FU: see above  Lennie Muckle, NP  11/23/2014 4:37 PM  [facility] [place], [place], [place], [place] Phone: [phone]; Fax: [phone]      I have examined the patient and reviewed  assessment and plan and discussed with patient.  Agree with above as stated.  Patient does not appear to be grossly fluid overloaded on exam, but is reporting more shortness of breath. Now also having chest discomfort. Will observe overnight. If she rules out, she may only need treatment for heart failure. If she rules in, she would certainly need ischemic evaluation. Prior catheterization was negative for severe coronary artery disease.  Katreena Schupp S.

## 2014-11-24 ENCOUNTER — Encounter (HOSPITAL_COMMUNITY)
Admission: AD | Disposition: A | Discharge status: Skilled Nursing Facility | Payer: Self-pay | Source: Ambulatory Visit | Attending: Thoracic Surgery (Cardiothoracic Vascular Surgery)

## 2014-11-24 ENCOUNTER — Ambulatory Visit (HOSPITAL_COMMUNITY): Payer: Medicare Other

## 2014-11-24 DIAGNOSIS — R079 Chest pain, unspecified: Secondary | ICD-10-CM

## 2014-11-24 DIAGNOSIS — I25110 Atherosclerotic heart disease of native coronary artery with unstable angina pectoris: Principal | ICD-10-CM

## 2014-11-24 DIAGNOSIS — I441 Atrioventricular block, second degree: Secondary | ICD-10-CM

## 2014-11-24 DIAGNOSIS — N179 Acute kidney failure, unspecified: Secondary | ICD-10-CM

## 2014-11-24 DIAGNOSIS — E785 Hyperlipidemia, unspecified: Secondary | ICD-10-CM

## 2014-11-24 HISTORY — PX: CARDIAC CATHETERIZATION: SHX172

## 2014-11-24 LAB — HEMOGLOBIN A1C
Hgb A1c MFr Bld: 6.7 % — ABNORMAL HIGH (ref 4.8–5.6)
Mean Plasma Glucose: 146 mg/dL

## 2014-11-24 LAB — LIPID PANEL
Cholesterol: 189 mg/dL (ref 0–200)
HDL: 51 mg/dL (ref >40)
LDL Cholesterol: 117 mg/dL — ABNORMAL HIGH (ref 0–99)
Total CHOL/HDL Ratio: 3.7 RATIO
Triglycerides: 104 mg/dL (ref <150)
VLDL: 21 mg/dL (ref 0–40)

## 2014-11-24 LAB — CBC
HCT: 46.5 % — ABNORMAL HIGH (ref 36.0–46.0)
Hemoglobin: 14.9 g/dL (ref 12.0–15.0)
MCH: 29 pg (ref 26.0–34.0)
MCHC: 32 g/dL (ref 30.0–36.0)
MCV: 90.5 fL (ref 78.0–100.0)
Platelets: 195 10*3/uL (ref 150–400)
RBC: 5.14 MIL/uL — ABNORMAL HIGH (ref 3.87–5.11)
RDW: 14 % (ref 11.5–15.5)
WBC: 8.7 10*3/uL (ref 4.0–10.5)

## 2014-11-24 LAB — HEPARIN LEVEL (UNFRACTIONATED)
Heparin Unfractionated: <0.1 IU/mL — ABNORMAL LOW (ref 0.30–0.70)
Heparin Unfractionated: 0.16 IU/mL — ABNORMAL LOW (ref 0.30–0.70)

## 2014-11-24 LAB — BASIC METABOLIC PANEL
Anion gap: 9 (ref 5–15)
BUN: 17 mg/dL (ref 6–20)
CO2: 29 mmol/L (ref 22–32)
Calcium: 9.5 mg/dL (ref 8.9–10.3)
Chloride: 101 mmol/L (ref 101–111)
Creatinine, Ser: 1.39 mg/dL — ABNORMAL HIGH (ref 0.44–1.00)
GFR calc Af Amer: 43 mL/min — ABNORMAL LOW (ref >60)
GFR calc non Af Amer: 37 mL/min — ABNORMAL LOW (ref >60)
Glucose, Bld: 121 mg/dL — ABNORMAL HIGH (ref 65–99)
Potassium: 4.2 mmol/L (ref 3.5–5.1)
Sodium: 139 mmol/L (ref 135–145)

## 2014-11-24 LAB — GLUCOSE, CAPILLARY: Glucose-Capillary: 225 mg/dL — ABNORMAL HIGH (ref 65–99)

## 2014-11-24 LAB — TROPONIN I: Troponin I: <0.03 ng/mL (ref <0.031)

## 2014-11-24 SURGERY — LEFT HEART CATH AND CORONARY ANGIOGRAPHY

## 2014-11-24 MED ORDER — SODIUM CHLORIDE 0.9 % IJ SOLN: 3.0000 mL | INTRAMUSCULAR | Status: DC | PRN

## 2014-11-24 MED ORDER — MIDAZOLAM HCL 2 MG/2ML IJ SOLN
INTRAMUSCULAR | Status: DC | PRN
  Administered 2014-11-24: 2 mg via INTRAVENOUS

## 2014-11-24 MED ORDER — NITROGLYCERIN 1 MG/10 ML FOR IR/CATH LAB
INTRA_ARTERIAL | Status: AC
  Filled 2014-11-24: qty 10

## 2014-11-24 MED ORDER — FUROSEMIDE 10 MG/ML IJ SOLN
80.0000 mg | Freq: Two times a day (BID) | INTRAMUSCULAR | Status: DC
  Administered 2014-11-24 – 2014-11-28 (×7): 80 mg via INTRAVENOUS
  Filled 2014-11-24 (×7): qty 8

## 2014-11-24 MED ORDER — DIPHENHYDRAMINE HCL 50 MG/ML IJ SOLN
12.5000 mg | INTRAMUSCULAR | Status: AC
  Administered 2014-11-24: 12.5 mg via INTRAVENOUS
  Filled 2014-11-24: qty 1

## 2014-11-24 MED ORDER — SODIUM CHLORIDE 0.9 % IV SOLN
INTRAVENOUS | Status: AC
  Administered 2014-11-24: 17:00:00 via INTRAVENOUS

## 2014-11-24 MED ORDER — SODIUM CHLORIDE 0.9 % IV SOLN: 250.0000 mL | INTRAVENOUS | Status: DC | PRN

## 2014-11-24 MED ORDER — HEPARIN (PORCINE) IN NACL 2-0.9 UNIT/ML-% IJ SOLN
INTRAMUSCULAR | Status: AC
  Filled 2014-11-24: qty 1000

## 2014-11-24 MED ORDER — FAMOTIDINE IN NACL 20-0.9 MG/50ML-% IV SOLN
20.0000 mg | INTRAVENOUS | Status: AC
  Administered 2014-11-24: 20 mg via INTRAVENOUS
  Filled 2014-11-24: qty 50

## 2014-11-24 MED ORDER — FENTANYL CITRATE (PF) 100 MCG/2ML IJ SOLN
INTRAMUSCULAR | Status: DC | PRN
  Administered 2014-11-24: 50 ug via INTRAVENOUS

## 2014-11-24 MED ORDER — VERAPAMIL HCL 2.5 MG/ML IV SOLN
INTRAVENOUS | Status: AC
  Filled 2014-11-24: qty 2

## 2014-11-24 MED ORDER — HEPARIN BOLUS VIA INFUSION
3000.0000 [IU] | Freq: Once | INTRAVENOUS | Status: AC
  Administered 2014-11-24: 3000 [IU] via INTRAVENOUS
  Filled 2014-11-24: qty 3000

## 2014-11-24 MED ORDER — METHYLPREDNISOLONE SODIUM SUCC 125 MG IJ SOLR
125.0000 mg | INTRAMUSCULAR | Status: AC
  Administered 2014-11-24: 125 mg via INTRAVENOUS
  Filled 2014-11-24: qty 2

## 2014-11-24 MED ORDER — SODIUM CHLORIDE 0.9 % IJ SOLN: 3.0000 mL | Freq: Two times a day (BID) | INTRAMUSCULAR | Status: DC

## 2014-11-24 MED ORDER — HEPARIN SODIUM (PORCINE) 1000 UNIT/ML IJ SOLN
INTRAMUSCULAR | Status: DC | PRN
  Administered 2014-11-24: 5000 [IU] via INTRAVENOUS

## 2014-11-24 MED ORDER — VERAPAMIL HCL 2.5 MG/ML IV SOLN
INTRA_ARTERIAL | Status: DC | PRN
  Administered 2014-11-24: 15:00:00 via INTRA_ARTERIAL

## 2014-11-24 MED ORDER — SODIUM CHLORIDE 0.9 % IJ SOLN
3.0000 mL | Freq: Two times a day (BID) | INTRAMUSCULAR | Status: DC
  Administered 2014-11-25 – 2014-11-28 (×4): 3 mL via INTRAVENOUS

## 2014-11-24 MED ORDER — LIDOCAINE HCL (PF) 1 % IJ SOLN
INTRAMUSCULAR | Status: DC | PRN
  Administered 2014-11-24: 15:00:00

## 2014-11-24 MED ORDER — ONDANSETRON HCL 4 MG/2ML IJ SOLN: 4.0000 mg | Freq: Four times a day (QID) | INTRAMUSCULAR | Status: DC | PRN

## 2014-11-24 MED ORDER — DIAZEPAM 5 MG PO TABS: 5.0000 mg | ORAL_TABLET | ORAL | Status: DC | PRN

## 2014-11-24 MED ORDER — ISOSORBIDE MONONITRATE ER 30 MG PO TB24
30.0000 mg | ORAL_TABLET | Freq: Every day | ORAL | Status: DC
  Administered 2014-11-24 – 2014-11-28 (×5): 30 mg via ORAL
  Filled 2014-11-24 (×5): qty 1

## 2014-11-24 MED ORDER — SODIUM CHLORIDE 0.9 % IV SOLN
INTRAVENOUS | Status: DC
  Administered 2014-11-24: 14:00:00 via INTRAVENOUS

## 2014-11-24 MED ORDER — HEPARIN SODIUM (PORCINE) 1000 UNIT/ML IJ SOLN
INTRAMUSCULAR | Status: AC
  Filled 2014-11-24: qty 1

## 2014-11-24 MED ORDER — HEPARIN (PORCINE) IN NACL 100-0.45 UNIT/ML-% IJ SOLN
1500.0000 [IU]/h | INTRAMUSCULAR | Status: DC
  Administered 2014-11-25: 1450 [IU]/h via INTRAVENOUS
  Administered 2014-11-25: 1400 [IU]/h via INTRAVENOUS
  Administered 2014-11-26: 1450 [IU]/h via INTRAVENOUS
  Filled 2014-11-24 (×4): qty 250

## 2014-11-24 MED ORDER — IOHEXOL 350 MG/ML SOLN
INTRAVENOUS | Status: DC | PRN
  Administered 2014-11-24: 55 mL via INTRACARDIAC

## 2014-11-24 MED ORDER — METHYLPREDNISOLONE SODIUM SUCC 125 MG IJ SOLR: 125.0000 mg | INTRAMUSCULAR | Status: DC

## 2014-11-24 MED ORDER — PERFLUTREN LIPID MICROSPHERE
1.0000 mL | INTRAVENOUS | Status: AC | PRN
  Filled 2014-11-24: qty 10

## 2014-11-24 MED ORDER — ACETAMINOPHEN 325 MG PO TABS: 650.0000 mg | ORAL_TABLET | ORAL | Status: DC | PRN

## 2014-11-24 MED ORDER — PREDNISONE 50 MG PO TABS
50.0000 mg | ORAL_TABLET | Freq: Once | ORAL | Status: AC
  Administered 2014-11-24: 50 mg via ORAL
  Filled 2014-11-24: qty 1

## 2014-11-24 MED ORDER — FENTANYL CITRATE (PF) 100 MCG/2ML IJ SOLN
INTRAMUSCULAR | Status: AC
  Filled 2014-11-24: qty 4

## 2014-11-24 MED ORDER — MIDAZOLAM HCL 2 MG/2ML IJ SOLN
INTRAMUSCULAR | Status: AC
  Filled 2014-11-24: qty 4

## 2014-11-24 MED ORDER — DIPHENHYDRAMINE HCL 50 MG/ML IJ SOLN: 12.5000 mg | INTRAMUSCULAR | Status: DC

## 2014-11-24 MED ORDER — LIDOCAINE HCL (PF) 1 % IJ SOLN
INTRAMUSCULAR | Status: AC
  Filled 2014-11-24: qty 30

## 2014-11-24 SURGICAL SUPPLY — 12 items
CATH INFINITI 5FR ANG PIGTAIL (CATHETERS) ×3 IMPLANT
CATH OPTITORQUE TIG 4.0 5F (CATHETERS) ×3 IMPLANT
DEVICE RAD COMP TR BAND LRG (VASCULAR PRODUCTS) ×3 IMPLANT
GLIDESHEATH SLEND A-KIT 6F 22G (SHEATH) ×3 IMPLANT
HOVERMATT SINGLE USE (MISCELLANEOUS) ×3 IMPLANT
KIT HEART LEFT (KITS) ×3 IMPLANT
PACK CARDIAC CATHETERIZATION (CUSTOM PROCEDURE TRAY) ×3 IMPLANT
SYR MEDRAD MARK V 150ML (SYRINGE) ×3 IMPLANT
TRANSDUCER W/STOPCOCK (MISCELLANEOUS) ×3 IMPLANT
TUBING CIL FLEX 10 FLL-RA (TUBING) ×3 IMPLANT
WIRE HI TORQ VERSACORE-J 145CM (WIRE) ×3 IMPLANT
WIRE SAFE-T 1.5MM-J .035X260CM (WIRE) ×3 IMPLANT

## 2014-11-24 NOTE — Progress Notes (Signed)
  Echocardiogram 2D Echocardiogram has been performed.  [name] 11/24/2014, 10:59 AM

## 2014-11-24 NOTE — Interval H&P Note (Signed)
Cath Lab Visit (complete for each Cath Lab visit)  Clinical Evaluation Leading to the Procedure:   ACS: No.  Non-ACS:    Anginal Classification: CCS III  Anti-ischemic medical therapy: Maximal Therapy (2 or more classes of medications)  Non-Invasive Test Results: No non-invasive testing performed  Prior CABG: No previous CABG      History and Physical Interval Note:  11/24/2014 2:33 PM  [name]  has presented today for surgery, with the diagnosis of cp  The various methods of treatment have been discussed with the patient and family. After consideration of risks, benefits and other options for treatment, the patient has consented to  Procedure(s): Left Heart Cath and Coronary Angiography (N/A) as a surgical intervention .  The patient's history has been reviewed, patient examined, no change in status, stable for surgery.  I have reviewed the patient's chart and labs.  Questions were answered to the patient's satisfaction.     KELLY,THOMAS A

## 2014-11-24 NOTE — H&P (View-Only) (Signed)
Patient Name: [name] Date of Encounter: 11/24/2014   Primary Cardiologist: Dr. [name]   Pt Profile: The patient with hx of CAD, diastolic heart failure, nonischemic cardiomyopathy, morbid obesity, 2nd degree HB s/p Dual chamber pacemaker in 2001, then see developed CHF. At one point left ventricular ejection fraction was severely depressed and her pacemaker was upgraded to a CRT-D device in 2008. She was a "hyperresponder" to CRT and EF returned to normal. Sequentially, at the time of her generator change out in 2014 her biventricular defibrillator generator was changed out for a biventricular pacemaker ([place] dual-chamber) who was admitted directly 11/23/14 for CP and SOB.    SUBJECTIVE  Stable chronic SOB, requiring supplemental oxygen. Having substernal CP on exertion. No palpitations.   CURRENT MEDS . amLODipine  10 mg Oral Daily  . aspirin EC  81 mg Oral Daily  . bisoprolol  5 mg Oral Daily  . budesonide-formoterol  2 puff Inhalation BID  . docusate sodium  100 mg Oral Daily  . furosemide  80 mg Intravenous Q12H  . heparin  3,000 Units Intravenous Once  . loratadine  10 mg Oral Daily  . losartan  100 mg Oral Daily  . nitroGLYCERIN  1 inch Topical 4 times per day  . pantoprazole  80 mg Oral Daily  . potassium chloride SA  20 mEq Oral Daily  . pravastatin  80 mg Oral QPM  . spironolactone  12.5 mg Oral BID    OBJECTIVE  Filed Vitals:   11/23/14 1951 11/23/14 2020 11/24/14 0050 11/24/14 0612  BP:  107/64 115/58 115/72  Pulse:  85 69 73  Temp:  98.7 F (37.1 C) 97.1 F (36.2 C) 97.9 F (36.6 C)  TempSrc:  Oral Oral Oral  Resp:  20 18 20   Height:      Weight:    243 lb (110.224 kg)  SpO2: 95% 97% 96% 99%    Intake/Output Summary (Last 24 hours) at 11/24/14 0917 Last data filed at 11/24/14 0803  Gross per 24 hour  Intake    360 ml  Output   2600 ml  Net  -2240 ml   Filed Weights   11/23/14 1700 11/24/14 0612  Weight: 246 lb  11.2 oz (111.902 kg) 243 lb (110.224 kg)    PHYSICAL EXAM  General: Pleasant, NAD. Neuro: Alert and oriented X 3. Moves all extremities spontaneously. Psych: Normal affect. HEENT:  Normal  Neck: Supple without bruits. Unable to assess JVD due to body habituate.  Lungs:  Resp regular and unlabored, CTA. Faint rales bibasilar.  Heart: RRR no s3, s4, or murmurs. Abdomen: Soft, non-tender, distended ?BS + x 4.  Extremities: No clubbing, cyanosis or edema. DP/PT/Radials 2+ and equal bilaterally.  Accessory Clinical Findings  CBC  Recent Labs  11/23/14 1744 11/24/14 0432  WBC 9.2 8.7  NEUTROABS 6.5  --   HGB 14.5 14.9  HCT 45.6 46.5*  MCV 89.4 90.5  PLT 212 0000000   Basic Metabolic Panel  Recent Labs  11/23/14 1744 11/24/14 0432  NA 141 139  K 4.3 4.2  CL 104 101  CO2 28 29  GLUCOSE 128* 121*  BUN 15 17  CREATININE 1.20* 1.39*  CALCIUM 9.7 9.5  MG 2.0  --    Liver Function Tests  Recent Labs  11/23/14 1744  AST 24  ALT 27  ALKPHOS 86  BILITOT 0.5  PROT 7.2  ALBUMIN 3.6   Cardiac Enzymes  Recent Labs  11/23/14 1744  11/23/14 2236 11/24/14 0432  TROPONINI <0.03 <0.03 <0.03    Hemoglobin A1C  Recent Labs  11/23/14 1744  HGBA1C 6.7*   Fasting Lipid Panel  Recent Labs  11/24/14 0432  CHOL 189  HDL 51  LDLCALC 117*  TRIG 104  CHOLHDL 3.7   Thyroid Function Tests  Recent Labs  11/23/14 1744  TSH 2.505    TELE  AV paced rhythm  Radiology/Studies  X-ray Chest Pa And Lateral  11/23/2014  CLINICAL DATA:  Congestive heart failure, cough. EXAM: CHEST  2 VIEW COMPARISON:  06/25/2014 FINDINGS: Dual lead cardiac pacemaker with battery apparatus within the right chest wall is stable. Cardiomediastinal silhouette is enlarged. Mediastinal contours appear intact. There is no evidence of focal airspace consolidation, pleural effusion or pneumothorax. Osseous structures are without acute abnormality. Lower cervical spine fusion is noted. Soft  tissues are grossly normal. IMPRESSION: Enlarged cardiac silhouette, otherwise no evidence of acute cardiopulmonary process. Electronically Signed   By: Fidela Salisbury M.D.   On: 11/23/2014 18:49   ECHO: 11/27/14 Study Conclusions - Procedure narrative: Technically difficult study with reduced endocardial definition. - Left ventricle: The cavity size was normal. Wall thickness is indeterminate. Systolic function was vigorous. The estimated ejection fraction was in the range of 65% to 70%. Doppler parameters are consistent with abnormal left ventricular relaxation (grade 1 diastolic dysfunction). The E/e&' ratio is between 8-15, suggesting indeterminate LV filling pressure. - Left atrium: LA Volume/BSA= 19.7 ml/m2. The atrium was normal in size. - Right ventricle: The cavity size was normal. Wall thickness was normal. Pacer wire or catheter noted in right ventricle. Systolic function was normal. - Right atrium: The atrium was mildly dilated. Pacer wire or catheter noted in right atrium. - Tricuspid valve: There was mild regurgitation. - Pulmonary arteries: PA peak pressure: 27 mm Hg (S). - Inferior vena cava: The vessel was dilated. The respirophasic diameter changes were blunted (< 50%), consistent with elevated central venous pressure. Diameter: 21.8 mm. Impressions: - Technically difficult study. Contrast may have been helpful. Compared to the prior study in 07/2013, there is likely little change, however, EF is increased.   Cath 07/24/14 ANGIOGRAPHY:  The left main coronary artery had ostial calcification but otherwise was free of obstructive disease and bifurcated into the LAD and left circumflex coronary artery.  The left anterior descending artery gave rise to a proximal diagonal vessel which had 30% ostial narrowing after 2 septal perforating arteries and the second diagonal vessel. The midportion of the LAD had 40-50% tubular narrowing during  diastole. However, with systole, there was midsystolic bridging with narrowing up to 80% during systole. There also was mild A999333 systolic bridging in the midportion of the second diagonal vessel. The remainder of the LAD was free of significant disease and extended to and wrapped around the LAD.  The left circumflex coronary artery gave rise to a proximal marginal vessel. After a superior branch arising from this marginal vessel there was 20% narrowing in this OM1 vessel. The remainder of the circumflex was free of significant disease and supplied the distal marginal and PLA -like vessel.  The right coronary artery had some 20-30% narrowing in its midsegment. There was 20% narrowing in the mid PDA. The distal circumflex were normal.  IMPRESSION:   Normal LV function with EF 50 - 55% Calcification of LM ostium without stenosis LAD with 30% Dx1 ostial stenosis, 40 - 50% mid LAD stenosis with systolic bridging to [id number] focally and 30% in mid Dx2. RCA with 20 -  30% narrowing.  RECOMMENDATION:  Medical therapy.   ASSESSMENT AND PLAN  1. Unstable angina due to CAD vs CHF. - Having CP with minimum exertion.  - Last echo 08/2014 with LV EF of 65-70%, grade 1 DD,  PA peak pressure: 27 mm Hg (S). Technically difficult study.  - Last cath 07/24/14 calcification of LM ostium without stenosis; LAD with 30% Dx1 ostial stenosis, 40 - 50% mid LAD stenosis with systolic bridging to [id number] focally and 30% in mid Dx2. RCA with 20 - 30% narrowing. - Trop x 3 negative. EKG non ischemic. - She was admitted with plan for nuc vs cath. She has eaten today. Due to symptoms concerning of CAD will do LHC later today. ?RHC.  - Continue IV heparin, ASA, Bisoprolol, stain.   2 Acute on chronic diastolic heart failure.  - CXR Enlarged cardiac silhouette, otherwise no evidence of acute cardiopulmonary process. - Echo as above. BNP normal.  - Given lasix IV 80mg  x 2. Diuresed 2.1L so far. She also on gentle hydration  13ml/hr. Lungs with faint rales. Seems distended abdomen.    3. Second-degree AV block  - status post BIv Pacer [employer]. Tele with AV and A pacved.   4.  CAD - AS above  5. Obstructive sleep apnea - not tolerate cpap  6. AKI - Cr worsen to 1.39. Will hold losartan. Will discuss with MD for further management.   7. HL - LDL 117. Continue statin  8. DM - A1C of 6.7 - Not on any medicaton.  Dispo: Patient is allergic IVP dye. Please address issue. Patient went for  echo.   Signed, Bhagat,Bhavinkumar PA-C   I have seen, examined and evaluated the patient this AM along with Mr. Curly Shores.  After reviewing all the available data and chart,  I agree with his findings, examination as well as impression recommendations.  Pt with moderate CAD by prior cath - h/o NICM - resolved with med Rx.  Morbidly obese.   Was doing well until a few months ago - started noting fatigue & DOE with less & less activity.  Over the past few days, has now also noted L sided CP/pressure - usually with exertion, but woke up with pain yesterday.  Seen in [place] admitted to r/o MI.   Troponin Negative, but still notes SSCP simply walking to the bathroom - relieved with rest.  At this point - cannot exclude Class III-IV progressive anginal Sx in a pt with known moderate CAD.  She is morbidly obese & not likely a good candidate for non-invasive study given these concerning Sx.   Her Sx could be microvascular & not show up on either study, but to confirm or deny progression of Macrovascular disease, would recommend LHC-angio.  She probably also has a component of diastolic dysfunction / DHF - was on IV Lasix - hold for cath.  Pending cath results & LVEDP, consider restarting in AM.  Will pre-med for Cath - Prednisone 50 mg now, solumedrol/Benadryl & pepcid on-call to cath lab.   The procedure with Risks/Benefits/Alternatives and Indications was reviewed with the patient.  All questions were  answered.    Risks / Complications include, but not limited to: Death, MI, CVA/TIA, VF/VT (with defibrillation), Bradycardia (need for temporary pacer placement), contrast induced nephropathy, bleeding / bruising / hematoma / pseudoaneurysm, vascular or coronary injury (with possible emergent CT or Vascular Surgery), adverse medication reactions, infection.  Additional risks involving the use of radiation with the possibility  of radiation burns and cancer were explained in detail.  The patient voices understanding and agree to proceed.       Leonie Man, M.D., M.S. Interventional Cardiologist   Pager # [phone]

## 2014-11-24 NOTE — Progress Notes (Signed)
ANTICOAGULATION CONSULT NOTE - Follow Up Consult  Pharmacy Consult for Heparin  Indication: chest pain/ACS  Allergies  Allergen Reactions  . Ivp Dye [Iodinated Diagnostic Agents] Shortness Of Breath    No reaction to PO contrast with non-ionic dye.06-25-2014/rsm  . Shellfish-Derived Products Anaphylaxis  . Sulfa Antibiotics Shortness Of Breath  . Iodine Hives  . Zithromax [Azithromycin] Rash    Patient Measurements: Height: 5\' 5"  (165.1 cm) Weight: 246 lb 11.2 oz (111.902 kg) (scale c) IBW/kg (Calculated) : 57  Vital Signs: Temp: 97.1 F (36.2 C) (10/14 0050) Temp Source: Oral (10/14 0050) BP: 115/58 mmHg (10/14 0050) Pulse Rate: 69 (10/14 0050)  Labs:  Recent Labs  11/23/14 1744 11/23/14 2236 11/24/14 0026  HGB 14.5  --   --   HCT 45.6  --   --   PLT 212  --   --   APTT 31  --   --   LABPROT 13.5  --   --   INR 1.01  --   --   HEPARINUNFRC  --   --  <0.10*  CREATININE 1.20*  --   --   TROPONINI <0.03 <0.03  --     Estimated Creatinine Clearance: 54.4 mL/min (by C-G formula based on Cr of 1.2).  Assessment: Inaccurate heparin level, heparin was started late, this level was drawn ~2 hours after heparin was started  Goal of Therapy:  Heparin level 0.3-0.7 units/ml Monitor platelets by anticoagulation protocol: Yes   Plan:  -Re-check HL at 0800  Narda Bonds 11/24/2014,3:15 AM

## 2014-11-24 NOTE — Progress Notes (Signed)
ANTICOAGULATION CONSULT NOTE - Follow Up Consult  Pharmacy Consult for Heparin Indication: chest pain/ACS  Allergies  Allergen Reactions  . Ivp Dye [Iodinated Diagnostic Agents] Shortness Of Breath    No reaction to PO contrast with non-ionic dye.06-25-2014/rsm  . Shellfish-Derived Products Anaphylaxis  . Sulfa Antibiotics Shortness Of Breath  . Iodine Hives  . Zithromax [Azithromycin] Rash    Patient Measurements: Height: 5\' 5"  (165.1 cm) Weight: 243 lb (110.224 kg) IBW/kg (Calculated) : 57 Heparin Dosing Weight: 85 kg  Vital Signs: Temp: 98.1 F (36.7 C) (10/14 1556) Temp Source: Oral (10/14 1556) BP: 116/66 mmHg (10/14 1556) Pulse Rate: 67 (10/14 1556)  Labs:  Recent Labs  11/23/14 1744 11/23/14 2236 11/24/14 0026 11/24/14 0432 11/24/14 0747  HGB 14.5  --   --  14.9  --   HCT 45.6  --   --  46.5*  --   PLT 212  --   --  195  --   APTT 31  --   --   --   --   LABPROT 13.5  --   --   --   --   INR 1.01  --   --   --   --   HEPARINUNFRC  --   --  <0.10*  --  0.16*  CREATININE 1.20*  --   --  1.39*  --   TROPONINI <0.03 <0.03  --  <0.03  --     Estimated Creatinine Clearance: 46.6 mL/min (by C-G formula based on Cr of 1.39).  Assessment: 66 YOF s/p cath who had a subtherapeutic HL this morning on 1100 units/hr. To restart heparin 10 hours post sheath removal- sheath out at 1526 and TR band placed. Hgb stable at 14.9, plts 195- no bleeding noted.  Goal of Therapy:  Heparin level 0.3-0.7 units/ml Monitor platelets by anticoagulation protocol: Yes   Plan:  - restart heparin drip at 1400 units/hr at 0130 - HL at 0800 - Daily heparin level and CBC while on heparin  Marily Konczal D. Essa Wenk, PharmD, BCPS Clinical Pharmacist Pager: [phone] 11/24/2014 4:00 PM

## 2014-11-24 NOTE — Progress Notes (Addendum)
Patient Name: [name] Date of Encounter: 11/24/2014   Primary Cardiologist: Dr. [name]   Pt Profile: The patient with hx of CAD, diastolic heart failure, nonischemic cardiomyopathy, morbid obesity, 2nd degree HB s/p Dual chamber pacemaker in 2001, then see developed CHF. At one point left ventricular ejection fraction was severely depressed and her pacemaker was upgraded to a CRT-D device in 2008. She was a "hyperresponder" to CRT and EF returned to normal. Sequentially, at the time of her generator change out in 2014 her biventricular defibrillator generator was changed out for a biventricular pacemaker ([place] dual-chamber) who was admitted directly 11/23/14 for CP and SOB.    SUBJECTIVE  Stable chronic SOB, requiring supplemental oxygen. Having substernal CP on exertion. No palpitations.   CURRENT MEDS . amLODipine  10 mg Oral Daily  . aspirin EC  81 mg Oral Daily  . bisoprolol  5 mg Oral Daily  . budesonide-formoterol  2 puff Inhalation BID  . docusate sodium  100 mg Oral Daily  . furosemide  80 mg Intravenous Q12H  . heparin  3,000 Units Intravenous Once  . loratadine  10 mg Oral Daily  . losartan  100 mg Oral Daily  . nitroGLYCERIN  1 inch Topical 4 times per day  . pantoprazole  80 mg Oral Daily  . potassium chloride SA  20 mEq Oral Daily  . pravastatin  80 mg Oral QPM  . spironolactone  12.5 mg Oral BID    OBJECTIVE  Filed Vitals:   11/23/14 1951 11/23/14 2020 11/24/14 0050 11/24/14 0612  BP:  107/64 115/58 115/72  Pulse:  85 69 73  Temp:  98.7 F (37.1 C) 97.1 F (36.2 C) 97.9 F (36.6 C)  TempSrc:  Oral Oral Oral  Resp:  20 18 20   Height:      Weight:    243 lb (110.224 kg)  SpO2: 95% 97% 96% 99%    Intake/Output Summary (Last 24 hours) at 11/24/14 0917 Last data filed at 11/24/14 0803  Gross per 24 hour  Intake    360 ml  Output   2600 ml  Net  -2240 ml   Filed Weights   11/23/14 1700 11/24/14 0612  Weight: 246 lb  11.2 oz (111.902 kg) 243 lb (110.224 kg)    PHYSICAL EXAM  General: Pleasant, NAD. Neuro: Alert and oriented X 3. Moves all extremities spontaneously. Psych: Normal affect. HEENT:  Normal  Neck: Supple without bruits. Unable to assess JVD due to body habituate.  Lungs:  Resp regular and unlabored, CTA. Faint rales bibasilar.  Heart: RRR no s3, s4, or murmurs. Abdomen: Soft, non-tender, distended ?BS + x 4.  Extremities: No clubbing, cyanosis or edema. DP/PT/Radials 2+ and equal bilaterally.  Accessory Clinical Findings  CBC  Recent Labs  11/23/14 1744 11/24/14 0432  WBC 9.2 8.7  NEUTROABS 6.5  --   HGB 14.5 14.9  HCT 45.6 46.5*  MCV 89.4 90.5  PLT 212 0000000   Basic Metabolic Panel  Recent Labs  11/23/14 1744 11/24/14 0432  NA 141 139  K 4.3 4.2  CL 104 101  CO2 28 29  GLUCOSE 128* 121*  BUN 15 17  CREATININE 1.20* 1.39*  CALCIUM 9.7 9.5  MG 2.0  --    Liver Function Tests  Recent Labs  11/23/14 1744  AST 24  ALT 27  ALKPHOS 86  BILITOT 0.5  PROT 7.2  ALBUMIN 3.6   Cardiac Enzymes  Recent Labs  11/23/14 1744  11/23/14 2236 11/24/14 0432  TROPONINI <0.03 <0.03 <0.03    Hemoglobin A1C  Recent Labs  11/23/14 1744  HGBA1C 6.7*   Fasting Lipid Panel  Recent Labs  11/24/14 0432  CHOL 189  HDL 51  LDLCALC 117*  TRIG 104  CHOLHDL 3.7   Thyroid Function Tests  Recent Labs  11/23/14 1744  TSH 2.505    TELE  AV paced rhythm  Radiology/Studies  X-ray Chest Pa And Lateral  11/23/2014  CLINICAL DATA:  Congestive heart failure, cough. EXAM: CHEST  2 VIEW COMPARISON:  06/25/2014 FINDINGS: Dual lead cardiac pacemaker with battery apparatus within the right chest wall is stable. Cardiomediastinal silhouette is enlarged. Mediastinal contours appear intact. There is no evidence of focal airspace consolidation, pleural effusion or pneumothorax. Osseous structures are without acute abnormality. Lower cervical spine fusion is noted. Soft  tissues are grossly normal. IMPRESSION: Enlarged cardiac silhouette, otherwise no evidence of acute cardiopulmonary process. Electronically Signed   By: Fidela Salisbury M.D.   On: 11/23/2014 18:49   ECHO: 11/27/14 Study Conclusions - Procedure narrative: Technically difficult study with reduced endocardial definition. - Left ventricle: The cavity size was normal. Wall thickness is indeterminate. Systolic function was vigorous. The estimated ejection fraction was in the range of 65% to 70%. Doppler parameters are consistent with abnormal left ventricular relaxation (grade 1 diastolic dysfunction). The E/e&' ratio is between 8-15, suggesting indeterminate LV filling pressure. - Left atrium: LA Volume/BSA= 19.7 ml/m2. The atrium was normal in size. - Right ventricle: The cavity size was normal. Wall thickness was normal. Pacer wire or catheter noted in right ventricle. Systolic function was normal. - Right atrium: The atrium was mildly dilated. Pacer wire or catheter noted in right atrium. - Tricuspid valve: There was mild regurgitation. - Pulmonary arteries: PA peak pressure: 27 mm Hg (S). - Inferior vena cava: The vessel was dilated. The respirophasic diameter changes were blunted (< 50%), consistent with elevated central venous pressure. Diameter: 21.8 mm. Impressions: - Technically difficult study. Contrast may have been helpful. Compared to the prior study in 07/2013, there is likely little change, however, EF is increased.   Cath 07/24/14 ANGIOGRAPHY:  The left main coronary artery had ostial calcification but otherwise was free of obstructive disease and bifurcated into the LAD and left circumflex coronary artery.  The left anterior descending artery gave rise to a proximal diagonal vessel which had 30% ostial narrowing after 2 septal perforating arteries and the second diagonal vessel. The midportion of the LAD had 40-50% tubular narrowing during  diastole. However, with systole, there was midsystolic bridging with narrowing up to 80% during systole. There also was mild A999333 systolic bridging in the midportion of the second diagonal vessel. The remainder of the LAD was free of significant disease and extended to and wrapped around the LAD.  The left circumflex coronary artery gave rise to a proximal marginal vessel. After a superior branch arising from this marginal vessel there was 20% narrowing in this OM1 vessel. The remainder of the circumflex was free of significant disease and supplied the distal marginal and PLA -like vessel.  The right coronary artery had some 20-30% narrowing in its midsegment. There was 20% narrowing in the mid PDA. The distal circumflex were normal.  IMPRESSION:   Normal LV function with EF 50 - 55% Calcification of LM ostium without stenosis LAD with 30% Dx1 ostial stenosis, 40 - 50% mid LAD stenosis with systolic bridging to [id number] focally and 30% in mid Dx2. RCA with 20 -  30% narrowing.  RECOMMENDATION:  Medical therapy.   ASSESSMENT AND PLAN  1. Unstable angina due to CAD vs CHF. - Having CP with minimum exertion.  - Last echo 08/2014 with LV EF of 65-70%, grade 1 DD,  PA peak pressure: 27 mm Hg (S). Technically difficult study.  - Last cath 07/24/14 calcification of LM ostium without stenosis; LAD with 30% Dx1 ostial stenosis, 40 - 50% mid LAD stenosis with systolic bridging to [id number] focally and 30% in mid Dx2. RCA with 20 - 30% narrowing. - Trop x 3 negative. EKG non ischemic. - She was admitted with plan for nuc vs cath. She has eaten today. Due to symptoms concerning of CAD will do LHC later today. ?RHC.  - Continue IV heparin, ASA, Bisoprolol, stain.   2 Acute on chronic diastolic heart failure.  - CXR Enlarged cardiac silhouette, otherwise no evidence of acute cardiopulmonary process. - Echo as above. BNP normal.  - Given lasix IV 80mg  x 2. Diuresed 2.1L so far. She also on gentle hydration  76ml/hr. Lungs with faint rales. Seems distended abdomen.    3. Second-degree AV block  - status post BIv Pacer [employer]. Tele with AV and A pacved.   4.  CAD - AS above  5. Obstructive sleep apnea - not tolerate cpap  6. AKI - Cr worsen to 1.39. Will hold losartan. Will discuss with MD for further management.   7. HL - LDL 117. Continue statin  8. DM - A1C of 6.7 - Not on any medicaton.  Dispo: Patient is allergic IVP dye. Please address issue. Patient went for  echo.   Signed, Bhagat,Bhavinkumar PA-C   I have seen, examined and evaluated the patient this AM along with Mr. Curly Shores.  After reviewing all the available data and chart,  I agree with his findings, examination as well as impression recommendations.  Pt with moderate CAD by prior cath - h/o NICM - resolved with med Rx.  Morbidly obese.   Was doing well until a few months ago - started noting fatigue & DOE with less & less activity.  Over the past few days, has now also noted L sided CP/pressure - usually with exertion, but woke up with pain yesterday.  Seen in [place] admitted to r/o MI.   Troponin Negative, but still notes SSCP simply walking to the bathroom - relieved with rest.  At this point - cannot exclude Class III-IV progressive anginal Sx in a pt with known moderate CAD.  She is morbidly obese & not likely a good candidate for non-invasive study given these concerning Sx.   Her Sx could be microvascular & not show up on either study, but to confirm or deny progression of Macrovascular disease, would recommend LHC-angio.  She probably also has a component of diastolic dysfunction / DHF - was on IV Lasix - hold for cath.  Pending cath results & LVEDP, consider restarting in AM.  Will pre-med for Cath - Prednisone 50 mg now, solumedrol/Benadryl & pepcid on-call to cath lab.   The procedure with Risks/Benefits/Alternatives and Indications was reviewed with the patient.  All questions were  answered.    Risks / Complications include, but not limited to: Death, MI, CVA/TIA, VF/VT (with defibrillation), Bradycardia (need for temporary pacer placement), contrast induced nephropathy, bleeding / bruising / hematoma / pseudoaneurysm, vascular or coronary injury (with possible emergent CT or Vascular Surgery), adverse medication reactions, infection.  Additional risks involving the use of radiation with the possibility  of radiation burns and cancer were explained in detail.  The patient voices understanding and agree to proceed.       Leonie Man, M.D., M.S. Interventional Cardiologist   Pager # [phone]

## 2014-11-24 NOTE — Progress Notes (Signed)
Utilization review completed. Marget Outten, RN, BSN. 

## 2014-11-24 NOTE — Progress Notes (Signed)
ANTICOAGULATION CONSULT NOTE - Follow Up Consult  Pharmacy Consult for Heparin Indication: chest pain/ACS  Allergies  Allergen Reactions  . Ivp Dye [Iodinated Diagnostic Agents] Shortness Of Breath    No reaction to PO contrast with non-ionic dye.06-25-2014/rsm  . Shellfish-Derived Products Anaphylaxis  . Sulfa Antibiotics Shortness Of Breath  . Iodine Hives  . Zithromax [Azithromycin] Rash    Patient Measurements: Height: 5\' 5"  (165.1 cm) Weight: 243 lb (110.224 kg) IBW/kg (Calculated) : 57 Heparin Dosing Weight: 85 kg  Vital Signs: Temp: 97.9 F (36.6 C) (10/14 0612) Temp Source: Oral (10/14 0612) BP: 115/72 mmHg (10/14 0612) Pulse Rate: 73 (10/14 0612)  Labs:  Recent Labs  11/23/14 1744 11/23/14 2236 11/24/14 0026 11/24/14 0432 11/24/14 0747  HGB 14.5  --   --  14.9  --   HCT 45.6  --   --  46.5*  --   PLT 212  --   --  195  --   APTT 31  --   --   --   --   LABPROT 13.5  --   --   --   --   INR 1.01  --   --   --   --   HEPARINUNFRC  --   --  <0.10*  --  0.16*  CREATININE 1.20*  --   --  1.39*  --   TROPONINI <0.03 <0.03  --  <0.03  --     Estimated Creatinine Clearance: 46.6 mL/min (by C-G formula based on Cr of 1.39).  Assessment:  Heparin level is subtherapeutic (0.16) on 1100 units/hr.  Goal of Therapy:  Heparin level 0.3-0.7 units/ml Monitor platelets by anticoagulation protocol: Yes   Plan:    Heparin 3000 units IV bolus   Increase heparin drip to 1400 units/hr.   Heparin level ~6 hrs after rate change.   Daily heparin level and CBC while on heparin.  Arty Baumgartner RPh Pager: [phone] 11/24/2014,8:56 AM

## 2014-11-25 ENCOUNTER — Encounter (HOSPITAL_COMMUNITY): Payer: Self-pay | Admitting: *Deleted

## 2014-11-25 DIAGNOSIS — I249 Acute ischemic heart disease, unspecified: Secondary | ICD-10-CM

## 2014-11-25 LAB — CBC
HCT: 49 % — ABNORMAL HIGH (ref 36.0–46.0)
Hemoglobin: 15.9 g/dL — ABNORMAL HIGH (ref 12.0–15.0)
MCH: 29.1 pg (ref 26.0–34.0)
MCHC: 32.4 g/dL (ref 30.0–36.0)
MCV: 89.6 fL (ref 78.0–100.0)
Platelets: 230 10*3/uL (ref 150–400)
RBC: 5.47 MIL/uL — ABNORMAL HIGH (ref 3.87–5.11)
RDW: 13.9 % (ref 11.5–15.5)
WBC: 13.9 10*3/uL — ABNORMAL HIGH (ref 4.0–10.5)

## 2014-11-25 LAB — GLUCOSE, CAPILLARY
Glucose-Capillary: 183 mg/dL — ABNORMAL HIGH (ref 65–99)
Glucose-Capillary: 163 mg/dL — ABNORMAL HIGH (ref 65–99)
Glucose-Capillary: 215 mg/dL — ABNORMAL HIGH (ref 65–99)
Glucose-Capillary: 130 mg/dL — ABNORMAL HIGH (ref 65–99)

## 2014-11-25 LAB — HEPARIN LEVEL (UNFRACTIONATED): Heparin Unfractionated: 0.34 IU/mL (ref 0.30–0.70)

## 2014-11-25 NOTE — Progress Notes (Signed)
Patient Name: [name] Date of Encounter: 11/25/2014     Active Problems:   Acute diastolic HF (heart failure) ([place])    SUBJECTIVE  The patient feels well today.  She is not having any chest discomfort or shortness of breath.  She had cardiac catheterization yesterday which revealed significant three-vessel disease.  Surgical consultation has been recommended.  CURRENT MEDS . amLODipine  10 mg Oral Daily  . aspirin EC  81 mg Oral Daily  . bisoprolol  5 mg Oral Daily  . budesonide-formoterol  2 puff Inhalation BID  . docusate sodium  100 mg Oral Daily  . furosemide  80 mg Intravenous BID  . isosorbide mononitrate  30 mg Oral Daily  . loratadine  10 mg Oral Daily  . pantoprazole  80 mg Oral Daily  . potassium chloride SA  20 mEq Oral Daily  . pravastatin  80 mg Oral QPM  . sodium chloride  3 mL Intravenous Q12H  . spironolactone  12.5 mg Oral BID    OBJECTIVE  Filed Vitals:   11/24/14 1640 11/24/14 2003 11/25/14 0005 11/25/14 0420  BP: 122/80 100/67 98/64 113/62  Pulse:  78 75 77  Temp:  98 F (36.7 C) 98.1 F (36.7 C) 98.3 F (36.8 C)  TempSrc:  Oral Oral Oral  Resp:  18 18 18   Height:      Weight:    244 lb 8 oz (110.904 kg)  SpO2:  100% 96% 96%    Intake/Output Summary (Last 24 hours) at 11/25/14 0751 Last data filed at 11/25/14 0600  Gross per 24 hour  Intake 1318.83 ml  Output   1800 ml  Net -481.17 ml   Filed Weights   11/23/14 1700 11/24/14 0612 11/25/14 0420  Weight: 246 lb 11.2 oz (111.902 kg) 243 lb (110.224 kg) 244 lb 8 oz (110.904 kg)    PHYSICAL EXAM  General: Pleasant, NAD. Neuro: Alert and oriented X 3. Moves all extremities spontaneously. Psych: Normal affect. HEENT:  Normal  Neck: Supple without bruits or JVD. Lungs:  Resp regular and unlabored, CTA. Heart: RRR no s3, s4, or murmurs. Abdomen: Soft, non-tender, non-distended, BS + x 4.  Extremities: No clubbing, cyanosis or edema. DP/PT/Radials 2+ and equal bilaterally.   Right radial pulse intact.  No hematoma.  Accessory Clinical Findings  CBC  Recent Labs  11/23/14 1744 11/24/14 0432 11/25/14 0446  WBC 9.2 8.7 13.9*  NEUTROABS 6.5  --   --   HGB 14.5 14.9 15.9*  HCT 45.6 46.5* 49.0*  MCV 89.4 90.5 89.6  PLT 212 195 [id number]   Basic Metabolic Panel  Recent Labs  11/23/14 1744 11/24/14 0432  NA 141 139  K 4.3 4.2  CL 104 101  CO2 28 29  GLUCOSE 128* 121*  BUN 15 17  CREATININE 1.20* 1.39*  CALCIUM 9.7 9.5  MG 2.0  --    Liver Function Tests  Recent Labs  11/23/14 1744  AST 24  ALT 27  ALKPHOS 86  BILITOT 0.5  PROT 7.2  ALBUMIN 3.6   No results for input(s): LIPASE, AMYLASE in the last 72 hours. Cardiac Enzymes  Recent Labs  11/23/14 1744 11/23/14 2236 11/24/14 0432  TROPONINI <0.03 <0.03 <0.03   BNP Invalid input(s): POCBNP D-Dimer No results for input(s): DDIMER in the last 72 hours. Hemoglobin A1C  Recent Labs  11/23/14 1744  HGBA1C 6.7*   Fasting Lipid Panel  Recent Labs  11/24/14 0432  CHOL 189  HDL 51  LDLCALC 117*  TRIG 104  CHOLHDL 3.7   Thyroid Function Tests  Recent Labs  11/23/14 1744  TSH 2.505    TELE  Normal sinus rhythm  ECG Normal sinus rhythm with ventricular pacing   Radiology/Studies  X-ray Chest Pa And Lateral  11/23/2014  CLINICAL DATA:  Congestive heart failure, cough. EXAM: CHEST  2 VIEW COMPARISON:  06/25/2014 FINDINGS: Dual lead cardiac pacemaker with battery apparatus within the right chest wall is stable. Cardiomediastinal silhouette is enlarged. Mediastinal contours appear intact. There is no evidence of focal airspace consolidation, pleural effusion or pneumothorax. Osseous structures are without acute abnormality. Lower cervical spine fusion is noted. Soft tissues are grossly normal. IMPRESSION: Enlarged cardiac silhouette, otherwise no evidence of acute cardiopulmonary process. Electronically Signed   By: Fidela Salisbury M.D.   On: 11/23/2014 18:49     ASSESSMENT AND PLAN  1.  Unstable angina secondary to three-vessel coronary disease, surgical consult pending. 2.  Functioning biventricular pacemaker.  [employer]. 3.  Obstructive sleep apnea.  Does not tolerate CPAP.  Plan: Continue IV heparin.  Await surgical consultation  Signed, Darlin Coco MD

## 2014-11-25 NOTE — Progress Notes (Signed)
ANTICOAGULATION CONSULT NOTE - Follow Up Consult  Pharmacy Consult for Heparin Indication: chest pain/ACS  Allergies  Allergen Reactions  . Ivp Dye [Iodinated Diagnostic Agents] Shortness Of Breath    No reaction to PO contrast with non-ionic dye.06-25-2014/rsm  . Shellfish-Derived Products Anaphylaxis  . Sulfa Antibiotics Shortness Of Breath  . Iodine Hives  . Zithromax [Azithromycin] Rash    Patient Measurements: Height: 5\' 5"  (165.1 cm) Weight: 244 lb 8 oz (110.904 kg) IBW/kg (Calculated) : 57 Heparin Dosing Weight: 85 kg  Vital Signs: Temp: 98.3 F (36.8 C) (10/15 0420) Temp Source: Oral (10/15 0420) BP: 113/62 mmHg (10/15 0420) Pulse Rate: 81 (10/15 0840)  Labs:  Recent Labs  11/23/14 1744 11/23/14 2236 11/24/14 0026 11/24/14 0432 11/24/14 0747 11/25/14 0446 11/25/14 0910  HGB 14.5  --   --  14.9  --  15.9*  --   HCT 45.6  --   --  46.5*  --  49.0*  --   PLT 212  --   --  195  --  230  --   APTT 31  --   --   --   --   --   --   LABPROT 13.5  --   --   --   --   --   --   INR 1.01  --   --   --   --   --   --   HEPARINUNFRC  --   --  <0.10*  --  0.16*  --  0.34  CREATININE 1.20*  --   --  1.39*  --   --   --   TROPONINI <0.03 <0.03  --  <0.03  --   --   --     Estimated Creatinine Clearance: 46.7 mL/min (by C-G formula based on Cr of 1.39).  Assessment: 62 YOF s/p cath 10/14 which showed 3V CAD.  Heparin drip was resumed 10 hours post sheath removal- at 0123 am at rate of 1400 units/hr.  8 hr HL therapeutic at 0.34. CBC stable, no bleeding reported.  Awaiting TCTS consult.  Goal of Therapy:  Heparin level 0.3-0.7 units/ml Monitor platelets by anticoagulation protocol: Yes   Plan:  - increase heparin drip to 1450 units/hr  - Daily heparin level and CBC  Eudelia Bunch, Pharm.D. BP:[phone] 11/25/2014 10:43 AM

## 2014-11-26 DIAGNOSIS — I25110 Atherosclerotic heart disease of native coronary artery with unstable angina pectoris: Secondary | ICD-10-CM

## 2014-11-26 LAB — URINALYSIS, ROUTINE W REFLEX MICROSCOPIC
Bilirubin Urine: NEGATIVE
Glucose, UA: NEGATIVE mg/dL
Hgb urine dipstick: NEGATIVE
Ketones, ur: NEGATIVE mg/dL
Nitrite: NEGATIVE
Protein, ur: NEGATIVE mg/dL
Specific Gravity, Urine: 1.008 (ref 1.005–1.030)
Urobilinogen, UA: 0.2 mg/dL (ref 0.0–1.0)
pH: 5.5 (ref 5.0–8.0)

## 2014-11-26 LAB — CBC
HCT: 45.7 % (ref 36.0–46.0)
Hemoglobin: 14.9 g/dL (ref 12.0–15.0)
MCH: 29.1 pg (ref 26.0–34.0)
MCHC: 32.6 g/dL (ref 30.0–36.0)
MCV: 89.3 fL (ref 78.0–100.0)
Platelets: 180 10*3/uL (ref 150–400)
RBC: 5.12 MIL/uL — ABNORMAL HIGH (ref 3.87–5.11)
RDW: 14.1 % (ref 11.5–15.5)
WBC: 14.4 10*3/uL — ABNORMAL HIGH (ref 4.0–10.5)

## 2014-11-26 LAB — GLUCOSE, CAPILLARY: Glucose-Capillary: 97 mg/dL (ref 65–99)

## 2014-11-26 LAB — URINE MICROSCOPIC-ADD ON

## 2014-11-26 LAB — HEPARIN LEVEL (UNFRACTIONATED): Heparin Unfractionated: 0.54 IU/mL (ref 0.30–0.70)

## 2014-11-26 NOTE — Progress Notes (Signed)
ANTICOAGULATION CONSULT NOTE - Follow Up Consult  Pharmacy Consult for Heparin Indication: chest pain/ACS  Allergies  Allergen Reactions  . Ivp Dye [Iodinated Diagnostic Agents] Shortness Of Breath    No reaction to PO contrast with non-ionic dye.06-25-2014/rsm  . Shellfish-Derived Products Anaphylaxis  . Sulfa Antibiotics Shortness Of Breath  . Iodine Hives  . Zithromax [Azithromycin] Rash    Patient Measurements: Height: 5\' 5"  (165.1 cm) Weight: 243 lb 14.4 oz (110.632 kg) IBW/kg (Calculated) : 57 Heparin Dosing Weight: 85 kg  Vital Signs: Temp: 98.4 F (36.9 C) (10/16 1334) Temp Source: Oral (10/16 1334) BP: 93/55 mmHg (10/16 1334) Pulse Rate: 68 (10/16 1334)  Labs:  Recent Labs  11/23/14 2236  11/24/14 0432 11/24/14 0747 11/25/14 0446 11/25/14 0910 11/26/14 0535  HGB  --   < > 14.9  --  15.9*  --  14.9  HCT  --   --  46.5*  --  49.0*  --  45.7  PLT  --   --  195  --  230  --  180  HEPARINUNFRC  --   < >  --  0.16*  --  0.34 0.54  CREATININE  --   --  1.39*  --   --   --   --   TROPONINI <0.03  --  <0.03  --   --   --   --   < > = values in this interval not displayed.  Estimated Creatinine Clearance: 46.6 mL/min (by C-G formula based on Cr of 1.39).  Assessment: 64 YOF s/p cath 10/14 which showed 3V CAD.  HL  therapeutic at 0.54 on rate of 1450 units/hr. CBC stable, no bleeding reported. Plan for possible CABG on Wednesday.  Goal of Therapy:  Heparin level 0.3-0.7 units/ml Monitor platelets by anticoagulation protocol: Yes   Plan:  - continue heparin drip at 1450 units/hr  - Daily heparin level and CBC  Eudelia Bunch, Pharm.D. [phone] 11/26/2014 6:35 PM

## 2014-11-26 NOTE — Consult Note (Signed)
Reason for Consult:3 vessel CAD Referring Physician: [name], MD, Sanda Klein, MD, Casandra Doffing, MD   [name] is an 69 y.o. female.  HPI: 69 yo woman who presented with a cc/o CP and SOB  [name] is a 69 yo woman with a history of diastolic CHF, biventricular pacemaker, nonobstructive CAD, obesity, hypertension, asthma,sleep apnea, IBS, GERD and anxiety. She presented on 11/23/2014 with after being awakened from sleep at 3:30 AM with chest pain and shortness of breath. She had been having exertional symptoms for about 2 weeks prior to this event. She did not have any N/V or diaphoresis.  She ruled out for MI and her BNP was 68. An echo done 10/14 showed an EF of 55-60% with grade I diastolic dysfunction. She had catheterization which revealed 3 vessel CAD but only moderate LAD disease. She was noted to have a small 4.0 cm ascending aneurysm.   Past Medical History  Diagnosis Date  . Asthma   . Hypertension   . Arthritis   . GERD (gastroesophageal reflux disease)   . Diverticulosis   . Anemia   . CAD (coronary artery disease)   . Fatty liver disease, nonalcoholic   . Internal hemorrhoids   . Gastritis   . Hyperplastic colon polyp   . Sleep apnea   . ICD (implantable cardiac defibrillator) in place   . Shortness of breath   . IBS (irritable bowel syndrome)   . Anxiety   . CHF (congestive heart failure) ([place])   . H/O hiatal hernia   . Obesity   . Back pain   . Diabetes mellitus without complication [facility])     Past Surgical History  Procedure Laterality Date  . Pacemaker insertion  2001    BS BivICD  . Cardiac defibrillator placement    . Appendectomy    . Knee surgery      right  . Ankle surgery      right  . Tubal ligation    . Cervical spine surgery      plate   . Coronary angiogram  2010  . Abdominal ultrasound  12/01/2011    Peripelvic cysts- #1- 2.4x1.9x2.3cm, #2-1.2x0.9x1.2cm  . Cardiac catheterization  05/17/1999    No significant  coronary obstructive disease w/ mild 20% luminal irregularity of the first diag branch of the LAD  . Cardiac catheterization  07/08/2002    No significant CAD, moderately depressed LV systolic function  . Cardiac catheterization Bilateral 04/26/2007    Normal findings, recommend medical therapy  . Cardiac catheterization  02/18/2008    Moderate CAD, would benefit from having a functional study, recommend continue medical therapy  . Cardiac catheterization  07/23/2012    Medical therapy  . Lexiscan myoview  11/14/2011    Mild-moderate defect seen in Mid Inferolateral and Mid Anterolateral regions-consistant w/ infarct/scar. No significant ischemia demonstrated.  . Transthoracic echocardiogram  07/23/2012    EF 55-60%, normal-mild  . Left heart catheterization with coronary angiogram N/A 07/23/2012    Procedure: LEFT HEART CATHETERIZATION WITH CORONARY ANGIOGRAM;  Surgeon: Leonie Man, MD;  Location: [facility] CATH LAB;  Service: Cardiovascular;  Laterality: N/A;  . Biv pacemaker generator change out N/A 11/02/2012    Procedure: BIV PACEMAKER GENERATOR CHANGE OUT;  Surgeon: Sanda Klein, MD;  Location: [facility] CATH LAB;  Service: Cardiovascular;  Laterality: N/A;    Family History  Problem Relation Age of Onset  . Breast cancer Mother   . Diabetes Mother   . Heart disease Maternal  Grandmother   . Kidney disease Maternal Grandmother   . Diabetes Maternal Grandmother   . Glaucoma Maternal Aunt   . Heart disease Maternal Aunt   . Asthma Sister     Social History:  reports that she quit smoking about 46 years ago. Her smoking use included Cigarettes. She has a .75 pack-year smoking history. She has never used smokeless tobacco. She reports that she does not drink alcohol or use illicit drugs.  Allergies:  Allergies  Allergen Reactions  . Ivp Dye [Iodinated Diagnostic Agents] Shortness Of Breath    No reaction to PO contrast with non-ionic dye.06-25-2014/rsm  . Shellfish-Derived Products Anaphylaxis   . Sulfa Antibiotics Shortness Of Breath  . Iodine Hives  . Zithromax [Azithromycin] Rash    Medications:  Scheduled: . amLODipine  10 mg Oral Daily  . aspirin EC  81 mg Oral Daily  . bisoprolol  5 mg Oral Daily  . budesonide-formoterol  2 puff Inhalation BID  . docusate sodium  100 mg Oral Daily  . furosemide  80 mg Intravenous BID  . isosorbide mononitrate  30 mg Oral Daily  . loratadine  10 mg Oral Daily  . pantoprazole  80 mg Oral Daily  . potassium chloride SA  20 mEq Oral Daily  . pravastatin  80 mg Oral QPM  . sodium chloride  3 mL Intravenous Q12H  . spironolactone  12.5 mg Oral BID    Results for orders placed or performed during the hospital encounter of 11/23/14 (from the past 48 hour(s))  Glucose, capillary     Status: Abnormal   Collection Time: 11/24/14  9:25 PM  Result Value Ref Range   Glucose-Capillary 225 (H) 65 - 99 mg/dL  CBC     Status: Abnormal   Collection Time: 11/25/14  4:46 AM  Result Value Ref Range   WBC 13.9 (H) 4.0 - 10.5 K/uL   RBC 5.47 (H) 3.87 - 5.11 MIL/uL   Hemoglobin 15.9 (H) 12.0 - 15.0 g/dL   HCT 49.0 (H) 36.0 - 46.0 %   MCV 89.6 78.0 - 100.0 fL   MCH 29.1 26.0 - 34.0 pg   MCHC 32.4 30.0 - 36.0 g/dL   RDW 13.9 11.5 - 15.5 %   Platelets 230 150 - 400 K/uL  Glucose, capillary     Status: Abnormal   Collection Time: 11/25/14  6:27 AM  Result Value Ref Range   Glucose-Capillary 183 (H) 65 - 99 mg/dL  Heparin level (unfractionated)     Status: None   Collection Time: 11/25/14  9:10 AM  Result Value Ref Range   Heparin Unfractionated 0.34 0.30 - 0.70 IU/mL    Comment:        IF HEPARIN RESULTS ARE BELOW EXPECTED VALUES, AND PATIENT DOSAGE HAS BEEN CONFIRMED, SUGGEST FOLLOW UP TESTING OF ANTITHROMBIN III LEVELS.   Glucose, capillary     Status: Abnormal   Collection Time: 11/25/14 11:21 AM  Result Value Ref Range   Glucose-Capillary 163 (H) 65 - 99 mg/dL  Glucose, capillary     Status: Abnormal   Collection Time: 11/25/14   4:14 PM  Result Value Ref Range   Glucose-Capillary 215 (H) 65 - 99 mg/dL  Glucose, capillary     Status: Abnormal   Collection Time: 11/25/14  9:23 PM  Result Value Ref Range   Glucose-Capillary 130 (H) 65 - 99 mg/dL  CBC     Status: Abnormal   Collection Time: 11/26/14  5:35 AM  Result Value Ref  Range   WBC 14.4 (H) 4.0 - 10.5 K/uL   RBC 5.12 (H) 3.87 - 5.11 MIL/uL   Hemoglobin 14.9 12.0 - 15.0 g/dL   HCT 45.7 36.0 - 46.0 %   MCV 89.3 78.0 - 100.0 fL   MCH 29.1 26.0 - 34.0 pg   MCHC 32.6 30.0 - 36.0 g/dL   RDW 14.1 11.5 - 15.5 %   Platelets 180 150 - 400 K/uL  Heparin level (unfractionated)     Status: None   Collection Time: 11/26/14  5:35 AM  Result Value Ref Range   Heparin Unfractionated 0.54 0.30 - 0.70 IU/mL    Comment:        IF HEPARIN RESULTS ARE BELOW EXPECTED VALUES, AND PATIENT DOSAGE HAS BEEN CONFIRMED, SUGGEST FOLLOW UP TESTING OF ANTITHROMBIN III LEVELS.   Glucose, capillary     Status: None   Collection Time: 11/26/14  6:10 AM  Result Value Ref Range   Glucose-Capillary 97 65 - 99 mg/dL  Urinalysis, Routine w reflex microscopic (not at [facility])     Status: Abnormal   Collection Time: 11/26/14 10:09 AM  Result Value Ref Range   Color, Urine YELLOW YELLOW   APPearance CLEAR CLEAR   Specific Gravity, Urine 1.008 1.005 - 1.030   pH 5.5 5.0 - 8.0   Glucose, UA NEGATIVE NEGATIVE mg/dL   Hgb urine dipstick NEGATIVE NEGATIVE   Bilirubin Urine NEGATIVE NEGATIVE   Ketones, ur NEGATIVE NEGATIVE mg/dL   Protein, ur NEGATIVE NEGATIVE mg/dL   Urobilinogen, UA 0.2 0.0 - 1.0 mg/dL   Nitrite NEGATIVE NEGATIVE   Leukocytes, UA TRACE (A) NEGATIVE  Urine microscopic-add on     Status: None   Collection Time: 11/26/14 10:09 AM  Result Value Ref Range   Squamous Epithelial / LPF RARE RARE   WBC, UA 0-2 <3 WBC/hpf    No results found.  Review of Systems  Constitutional: Negative for fever, chills and weight loss.  Respiratory: Positive for shortness of breath.    Cardiovascular: Positive for chest pain and leg swelling. Negative for orthopnea and claudication.  Gastrointestinal: Positive for diarrhea.       Abdominal distention  Genitourinary: Negative.   Musculoskeletal: Negative.   Neurological: Negative for speech change, focal weakness, seizures and loss of consciousness.   Blood pressure 93/55, pulse 68, temperature 98.4 F (36.9 C), temperature source Oral, resp. rate 18, height 5\' 5"  (1.651 m), weight 243 lb 14.4 oz (110.632 kg), SpO2 95 %. Physical Exam  Vitals reviewed. Constitutional: She is oriented to person, place, and time. No distress.  Morbidly obese  HENT:  Head: Normocephalic and atraumatic.  Eyes: Conjunctivae and EOM are normal. No scleral icterus.  Neck: Neck supple. No thyromegaly present.  No bruit  Cardiovascular: Normal rate, regular rhythm and normal heart sounds.  Exam reveals no gallop and no friction rub.   No murmur heard. Respiratory: Effort normal. She has no wheezes. She has no rales.  Diminished BS bibasilar  GI: Soft. There is no tenderness.  Musculoskeletal: She exhibits no edema.  Lymphadenopathy:    She has no cervical adenopathy.  Neurological: She is alert and oriented to person, place, and time. No cranial nerve deficit.  No focal motor deficit  Skin: Skin is warm and dry.   ECHOCARDIOGRAM 11/24/14 Study Conclusions  - Left ventricle: The cavity size was normal. Systolic function was normal. The estimated ejection fraction was in the range of 55% to 60%. Regional wall motion abnormalities cannot be excluded. Doppler  parameters are consistent with abnormal left ventricular relaxation (grade 1 diastolic dysfunction). Doppler parameters are consistent with elevated ventricular end-diastolic filling pressure. - Ascending aorta: The ascending aorta was mildly dilated, measuring 40 mm. - Mitral valve: Calcified annulus. Mildly thickened leaflets . There was no regurgitation. -  Left atrium: The atrium was mildly dilated. - Right ventricle: Pacer wire or catheter noted in right ventricle. Systolic function was normal. - Right atrium: Pacer wire or catheter noted in right atrium. - Tricuspid valve: There was mild regurgitation. - Inferior vena cava: The vessel was normal in size. - Pericardium, extracardiac: There was no pericardial effusion.  CARDIAC CATHETERIZATION  Prox RCA lesion, 85% stenosed.  Mid RCA lesion, 90% stenosed.  1st Mrg lesion, 85% stenosed.  Dist Cx lesion, 60% stenosed.  Ost 1st Diag lesion, 70% stenosed.  1st Diag lesion, 70% stenosed.  Ost 2nd Diag to 2nd Diag lesion, 70% stenosed.  Mid LAD to Dist LAD lesion, 50% stenosed.  The left ventricular systolic function is normal.  Hyperdynamic LV function with an ejection fraction of a aproximately 75%.  Significant multivessel CAD with 70% stenoses in the first and second diagonal branch of the LAD with 50% mid LAD stenosis; 60% diffuse mid AV groove circumflex stenosis with focal 85% very proximal stenosis of a large OM1 vessel; and complex RCA stenoses with diffuse 80% proximal stenosis on a bend in the vessel with 85% mid stenosis immediately after the RV marginal branch.  I personally reviewed the echo and cath images and concur with the findings as noted  Assessment/Plan:  [name] is a 69 yo woman with multiple medical issue including type II diabetes, morbid obesity, diastolic heart failure, and a biventricular pacemaker/ ICD. She presented with an unstable coronary syndrome and now has been found to have 3 vessel CAD. She also has a 4 cm ascending aneurysm.  Myocardial revascularization is indicated for survival benefit and relief of symptoms.   She is a candidate for CABG, but does not have a tight LAD stenosis. Will discuss best therapy with Cardiology.  I discussed the general nature of CABG, the need for general anesthesia, the use of cardiopulmonary bypass, and  the incisions to be used with [name]. I discussed the expected hospital stay, overall recovery and short and long term outcomes. She is familiar with the procedure as her son had it recently. I reviewed the indications, risks, benefits and alternatives. She understands the risks include, but are not limited to death, stroke, MI, DVT/PE, bleeding, possible need for transfusion, infections, cardiac arrhythmias, and other organ system dysfunction including respiratory, renal, or GI complications.   Will plan for CABG Wednesday if that is determined to be the best option.  Melrose Nakayama 11/26/2014, 1:44 PM

## 2014-11-26 NOTE — Progress Notes (Signed)
Patient Name: [name] Date of Encounter: 11/26/2014     Active Problems:   Acute diastolic HF (heart failure) ([place])    SUBJECTIVE  The patient is not having any chest pain or shortness of breath.  Mild headache relieved by Tylenol. Rhythm is normal sinus rhythm with ventricular pacing. Surgical consult pending  CURRENT MEDS . amLODipine  10 mg Oral Daily  . aspirin EC  81 mg Oral Daily  . bisoprolol  5 mg Oral Daily  . budesonide-formoterol  2 puff Inhalation BID  . docusate sodium  100 mg Oral Daily  . furosemide  80 mg Intravenous BID  . isosorbide mononitrate  30 mg Oral Daily  . loratadine  10 mg Oral Daily  . pantoprazole  80 mg Oral Daily  . potassium chloride SA  20 mEq Oral Daily  . pravastatin  80 mg Oral QPM  . sodium chloride  3 mL Intravenous Q12H  . spironolactone  12.5 mg Oral BID    OBJECTIVE  Filed Vitals:   11/25/14 1511 11/25/14 1938 11/25/14 2032 11/26/14 0328  BP: 114/58 122/60  103/65  Pulse: 74 65  73  Temp: 97.9 F (36.6 C) 98 F (36.7 C)  98 F (36.7 C)  TempSrc: Oral Oral  Axillary  Resp: 18 18  18   Height:      Weight:    243 lb 14.4 oz (110.632 kg)  SpO2: 94% 99% 98% 92%    Intake/Output Summary (Last 24 hours) at 11/26/14 0741 Last data filed at 11/26/14 0646  Gross per 24 hour  Intake   1356 ml  Output   2050 ml  Net   -694 ml   Filed Weights   11/24/14 0612 11/25/14 0420 11/26/14 0328  Weight: 243 lb (110.224 kg) 244 lb 8 oz (110.904 kg) 243 lb 14.4 oz (110.632 kg)    PHYSICAL EXAM  General: Pleasant, NAD. Neuro: Alert and oriented X 3. Moves all extremities spontaneously. Psych: Normal affect. HEENT:  Normal  Neck: Supple without bruits or JVD. Lungs:  Resp regular and unlabored, CTA. Heart: RRR no s3, s4, or murmurs. Abdomen: Soft, non-tender, non-distended, BS + x 4.  Extremities: No clubbing, cyanosis or edema. DP/PT/Radials 2+ and equal bilaterally.  Accessory Clinical Findings  CBC  Recent  Labs  11/23/14 1744  11/25/14 0446 11/26/14 0535  WBC 9.2  < > 13.9* 14.4*  NEUTROABS 6.5  --   --   --   HGB 14.5  < > 15.9* 14.9  HCT 45.6  < > 49.0* 45.7  MCV 89.4  < > 89.6 89.3  PLT 212  < > 230 180  < > = values in this interval not displayed. Basic Metabolic Panel  Recent Labs  11/23/14 1744 11/24/14 0432  NA 141 139  K 4.3 4.2  CL 104 101  CO2 28 29  GLUCOSE 128* 121*  BUN 15 17  CREATININE 1.20* 1.39*  CALCIUM 9.7 9.5  MG 2.0  --    Liver Function Tests  Recent Labs  11/23/14 1744  AST 24  ALT 27  ALKPHOS 86  BILITOT 0.5  PROT 7.2  ALBUMIN 3.6   No results for input(s): LIPASE, AMYLASE in the last 72 hours. Cardiac Enzymes  Recent Labs  11/23/14 1744 11/23/14 2236 11/24/14 0432  TROPONINI <0.03 <0.03 <0.03   BNP Invalid input(s): POCBNP D-Dimer No results for input(s): DDIMER in the last 72 hours. Hemoglobin A1C  Recent Labs  11/23/14 1744  HGBA1C  6.7*   Fasting Lipid Panel  Recent Labs  11/24/14 0432  CHOL 189  HDL 51  LDLCALC 117*  TRIG 104  CHOLHDL 3.7   Thyroid Function Tests  Recent Labs  11/23/14 1744  TSH 2.505    TELE  Normal sinus rhythm with ventricular pacing  ECG    Radiology/Studies  X-ray Chest Pa And Lateral  11/23/2014  CLINICAL DATA:  Congestive heart failure, cough. EXAM: CHEST  2 VIEW COMPARISON:  06/25/2014 FINDINGS: Dual lead cardiac pacemaker with battery apparatus within the right chest wall is stable. Cardiomediastinal silhouette is enlarged. Mediastinal contours appear intact. There is no evidence of focal airspace consolidation, pleural effusion or pneumothorax. Osseous structures are without acute abnormality. Lower cervical spine fusion is noted. Soft tissues are grossly normal. IMPRESSION: Enlarged cardiac silhouette, otherwise no evidence of acute cardiopulmonary process. Electronically Signed   By: Fidela Salisbury M.D.   On: 11/23/2014 18:49    ASSESSMENT AND PLAN  1. Unstable  angina secondary to three-vessel coronary disease, surgical consult pending. 2. Functioning biventricular pacemaker. [employer]. 3. Obstructive sleep apnea. Does not tolerate CPAP. 4.  Leukocytosis.  She was afebrile.  She is not having any dysuria but she states that her urine has been darker.  We will get a urinalysis and culture.  Repeat CBC tomorrow.  She denies any purulent sputum.  Plan: I spoke with Dr. Merilynn Finland yesterday.  He will see the patient today about possible CABG. Signed, Darlin Coco MD

## 2014-11-27 ENCOUNTER — Encounter (HOSPITAL_COMMUNITY)
Admission: AD | Disposition: A | Discharge status: Skilled Nursing Facility | Payer: Medicare Other | Source: Ambulatory Visit | Attending: Thoracic Surgery (Cardiothoracic Vascular Surgery)

## 2014-11-27 ENCOUNTER — Encounter (HOSPITAL_COMMUNITY): Payer: Self-pay | Admitting: Cardiovascular Disease

## 2014-11-27 DIAGNOSIS — Z9861 Coronary angioplasty status: Secondary | ICD-10-CM

## 2014-11-27 DIAGNOSIS — I2510 Atherosclerotic heart disease of native coronary artery without angina pectoris: Secondary | ICD-10-CM | POA: Insufficient documentation

## 2014-11-27 DIAGNOSIS — D72829 Elevated white blood cell count, unspecified: Secondary | ICD-10-CM

## 2014-11-27 HISTORY — PX: CARDIAC CATHETERIZATION: SHX172

## 2014-11-27 LAB — CBC
HCT: 43.7 % (ref 36.0–46.0)
Hemoglobin: 13.9 g/dL (ref 12.0–15.0)
MCH: 28.3 pg (ref 26.0–34.0)
MCHC: 31.8 g/dL (ref 30.0–36.0)
MCV: 89 fL (ref 78.0–100.0)
Platelets: 196 10*3/uL (ref 150–400)
RBC: 4.91 MIL/uL (ref 3.87–5.11)
RDW: 14.2 % (ref 11.5–15.5)
WBC: 10.8 10*3/uL — ABNORMAL HIGH (ref 4.0–10.5)

## 2014-11-27 LAB — BASIC METABOLIC PANEL
Anion gap: 11 (ref 5–15)
BUN: 35 mg/dL — ABNORMAL HIGH (ref 6–20)
CO2: 26 mmol/L (ref 22–32)
Calcium: 9.1 mg/dL (ref 8.9–10.3)
Chloride: 101 mmol/L (ref 101–111)
Creatinine, Ser: 1.45 mg/dL — ABNORMAL HIGH (ref 0.44–1.00)
GFR calc Af Amer: 41 mL/min — ABNORMAL LOW (ref >60)
GFR calc non Af Amer: 36 mL/min — ABNORMAL LOW (ref >60)
Glucose, Bld: 161 mg/dL — ABNORMAL HIGH (ref 65–99)
Potassium: 4.6 mmol/L (ref 3.5–5.1)
Sodium: 138 mmol/L (ref 135–145)

## 2014-11-27 LAB — URINE CULTURE

## 2014-11-27 LAB — HEPARIN LEVEL (UNFRACTIONATED): Heparin Unfractionated: 0.3 IU/mL (ref 0.30–0.70)

## 2014-11-27 LAB — POCT ACTIVATED CLOTTING TIME: Activated Clotting Time: 515 seconds

## 2014-11-27 SURGERY — INTRAVASCULAR PRESSURE WIRE/FFR STUDY

## 2014-11-27 MED ORDER — SODIUM CHLORIDE 0.9 % IJ SOLN: 3.0000 mL | INTRAMUSCULAR | Status: DC | PRN

## 2014-11-27 MED ORDER — VERAPAMIL HCL 2.5 MG/ML IV SOLN
INTRAVENOUS | Status: AC
  Filled 2014-11-27: qty 2

## 2014-11-27 MED ORDER — DIPHENHYDRAMINE HCL 50 MG/ML IJ SOLN
12.5000 mg | Freq: Once | INTRAMUSCULAR | Status: AC
  Administered 2014-11-27: 12.5 mg via INTRAVENOUS
  Filled 2014-11-27: qty 1

## 2014-11-27 MED ORDER — MIDAZOLAM HCL 2 MG/2ML IJ SOLN
INTRAMUSCULAR | Status: AC
  Filled 2014-11-27: qty 4

## 2014-11-27 MED ORDER — LIDOCAINE HCL (PF) 1 % IJ SOLN
INTRAMUSCULAR | Status: AC
  Filled 2014-11-27: qty 30

## 2014-11-27 MED ORDER — SODIUM CHLORIDE 0.9 % IJ SOLN: 3.0000 mL | Freq: Two times a day (BID) | INTRAMUSCULAR | Status: DC

## 2014-11-27 MED ORDER — HEPARIN SODIUM (PORCINE) 1000 UNIT/ML IJ SOLN
INTRAMUSCULAR | Status: DC | PRN
  Administered 2014-11-27: 8000 [IU] via INTRAVENOUS

## 2014-11-27 MED ORDER — NITROGLYCERIN 1 MG/10 ML FOR IR/CATH LAB
INTRA_ARTERIAL | Status: DC | PRN
  Administered 2014-11-27: 15:00:00

## 2014-11-27 MED ORDER — HEPARIN SODIUM (PORCINE) 1000 UNIT/ML IJ SOLN
INTRAMUSCULAR | Status: AC
  Filled 2014-11-27: qty 1

## 2014-11-27 MED ORDER — METHYLPREDNISOLONE SODIUM SUCC 125 MG IJ SOLR
125.0000 mg | Freq: Once | INTRAMUSCULAR | Status: AC
  Administered 2014-11-27: 125 mg via INTRAVENOUS
  Filled 2014-11-27: qty 2

## 2014-11-27 MED ORDER — HEPARIN (PORCINE) IN NACL 2-0.9 UNIT/ML-% IJ SOLN
INTRAMUSCULAR | Status: AC
  Filled 2014-11-27: qty 1000

## 2014-11-27 MED ORDER — NITROGLYCERIN 0.2 MG/ML ON CALL CATH LAB
INTRAVENOUS | Status: AC
  Filled 2014-11-27: qty 1

## 2014-11-27 MED ORDER — ADENOSINE (DIAGNOSTIC) 140MCG/KG/MIN
INTRAVENOUS | Status: DC | PRN
  Administered 2014-11-27: 140 ug/kg/min via INTRAVENOUS

## 2014-11-27 MED ORDER — FENTANYL CITRATE (PF) 100 MCG/2ML IJ SOLN
INTRAMUSCULAR | Status: DC | PRN
  Administered 2014-11-27: 25 ug via INTRAVENOUS

## 2014-11-27 MED ORDER — ASPIRIN 81 MG PO CHEW: 81.0000 mg | CHEWABLE_TABLET | ORAL | Status: DC

## 2014-11-27 MED ORDER — SODIUM CHLORIDE 0.9 % IV SOLN: INTRAVENOUS | Status: DC

## 2014-11-27 MED ORDER — ALPRAZOLAM 0.25 MG PO TABS
0.2500 mg | ORAL_TABLET | ORAL | Status: DC | PRN
  Administered 2014-11-28: 0.5 mg via ORAL
  Filled 2014-11-27: qty 2

## 2014-11-27 MED ORDER — NITROGLYCERIN 1 MG/10 ML FOR IR/CATH LAB
INTRA_ARTERIAL | Status: DC | PRN
  Administered 2014-11-27: 200 ug via INTRACORONARY

## 2014-11-27 MED ORDER — FENTANYL CITRATE (PF) 100 MCG/2ML IJ SOLN
INTRAMUSCULAR | Status: AC
  Filled 2014-11-27: qty 4

## 2014-11-27 MED ORDER — ADENOSINE 12 MG/4ML IV SOLN
16.0000 mL | Freq: Once | INTRAVENOUS | Status: DC
  Filled 2014-11-27 (×3): qty 16

## 2014-11-27 MED ORDER — GUAIFENESIN ER 600 MG PO TB12
600.0000 mg | ORAL_TABLET | Freq: Two times a day (BID) | ORAL | Status: AC
  Administered 2014-11-27 – 2014-11-28 (×4): 600 mg via ORAL
  Filled 2014-11-27 (×4): qty 1

## 2014-11-27 MED ORDER — IOHEXOL 350 MG/ML SOLN
INTRAVENOUS | Status: DC | PRN
  Administered 2014-11-27: 50 mL via INTRA_ARTERIAL

## 2014-11-27 MED ORDER — SODIUM CHLORIDE 0.9 % IV SOLN: 250.0000 mL | INTRAVENOUS | Status: DC | PRN

## 2014-11-27 MED ORDER — SODIUM CHLORIDE 0.9 % WEIGHT BASED INFUSION
1.0000 mL/kg/h | INTRAVENOUS | Status: AC
  Administered 2014-11-27: 1 mL/kg/h via INTRAVENOUS

## 2014-11-27 MED ORDER — MIDAZOLAM HCL 2 MG/2ML IJ SOLN
INTRAMUSCULAR | Status: DC | PRN
  Administered 2014-11-27: 2 mg via INTRAVENOUS

## 2014-11-27 MED ORDER — SODIUM CHLORIDE 0.9 % IJ SOLN
3.0000 mL | Freq: Two times a day (BID) | INTRAMUSCULAR | Status: DC
  Administered 2014-11-27 – 2014-11-28 (×3): 3 mL via INTRAVENOUS

## 2014-11-27 MED ORDER — HEPARIN (PORCINE) IN NACL 100-0.45 UNIT/ML-% IJ SOLN
1750.0000 [IU]/h | INTRAMUSCULAR | Status: DC
  Administered 2014-11-27: 1500 [IU]/h via INTRAVENOUS
  Administered 2014-11-28: 1650 [IU]/h via INTRAVENOUS
  Administered 2014-11-29: 1750 [IU]/h via INTRAVENOUS
  Filled 2014-11-27 (×2): qty 250

## 2014-11-27 MED FILL — Perflutren Lipid Microsphere IV Susp 1.1 MG/ML: INTRAVENOUS | Qty: 10 | Status: AC

## 2014-11-27 SURGICAL SUPPLY — 11 items
CATH VISTA GUIDE 6FR XBLAD3.5 (CATHETERS) ×3 IMPLANT
DEVICE RAD COMP TR BAND LRG (VASCULAR PRODUCTS) ×3 IMPLANT
GLIDESHEATH SLEND SS 6F .021 (SHEATH) ×3 IMPLANT
KIT ESSENTIALS PG (KITS) ×3 IMPLANT
KIT HEART LEFT (KITS) ×3 IMPLANT
PACK CARDIAC CATHETERIZATION (CUSTOM PROCEDURE TRAY) ×3 IMPLANT
TRANSDUCER W/STOPCOCK (MISCELLANEOUS) ×3 IMPLANT
TUBING CIL FLEX 10 FLL-RA (TUBING) ×3 IMPLANT
WIRE HI TORQ VERSACORE-J 145CM (WIRE) ×3 IMPLANT
WIRE PRESSURE VERRATA (WIRE) ×3 IMPLANT
WIRE SAFE-T 1.5MM-J .035X260CM (WIRE) ×3 IMPLANT

## 2014-11-27 NOTE — Progress Notes (Signed)
14cc of air deflated from TR band. TR band removed.Site intact, no bleeding noted. VS within normal limits.   .BP 116/72 mmHg  Pulse 86  Temp(Src) 98.6 F (37 C) (Oral)  Resp 18  Ht 5\' 5"  (1.651 m)  Wt 109.925 kg (242 lb 5.4 oz)  BMI 40.33 kg/m2  SpO2 98%

## 2014-11-27 NOTE — H&P (View-Only) (Signed)
Patient Name: [name] Date of Encounter: 11/27/2014  Pt. Profile: [name] is a 69 yo woman with a history of diastolic CHF, biventricular pacemaker, obesity, hypertension, asthma,sleep apnea, IBS, GERD and anxiety admitted with unstable angina.  SUBJECTIVE  Had mild chest discomfort early in morning. Currently CP free. Having cough. No SOB or palpitations.   CURRENT MEDS . amLODipine  10 mg Oral Daily  . aspirin EC  81 mg Oral Daily  . bisoprolol  5 mg Oral Daily  . budesonide-formoterol  2 puff Inhalation BID  . docusate sodium  100 mg Oral Daily  . furosemide  80 mg Intravenous BID  . isosorbide mononitrate  30 mg Oral Daily  . loratadine  10 mg Oral Daily  . pantoprazole  80 mg Oral Daily  . potassium chloride SA  20 mEq Oral Daily  . pravastatin  80 mg Oral QPM  . sodium chloride  3 mL Intravenous Q12H  . spironolactone  12.5 mg Oral BID    OBJECTIVE  Filed Vitals:   11/26/14 2106 11/27/14 0551 11/27/14 0554 11/27/14 0856  BP: 106/50  109/52   Pulse: 71  72   Temp:   97.7 F (36.5 C)   TempSrc: Oral  Oral   Resp: 18  18   Height:      Weight:  242 lb 5.4 oz (109.925 kg)    SpO2: 97%  96% 96%    Intake/Output Summary (Last 24 hours) at 11/27/14 0859 Last data filed at 11/27/14 0852  Gross per 24 hour  Intake 3143.54 ml  Output   2150 ml  Net 993.54 ml   Filed Weights   11/25/14 0420 11/26/14 0328 11/27/14 0551  Weight: 244 lb 8 oz (110.904 kg) 243 lb 14.4 oz (110.632 kg) 242 lb 5.4 oz (109.925 kg)    PHYSICAL EXAM  General: Pleasant, obese woman in NAD. Neuro: Alert and oriented X 3. Moves all extremities spontaneously. Psych: Normal affect. HEENT:  Normal  Neck: Supple without bruits or JVD. Lungs:  Resp regular and unlabored, CTA. Heart: RRR no s3, s4, or murmurs. Abdomen: Soft, non-tender, non-distended, BS + x 4.  Extremities: No clubbing, cyanosis or edema. DP/PT/Radials 2+ and equal bilaterally.  Accessory Clinical  Findings  CBC  Recent Labs  11/26/14 0535 11/27/14 0256  WBC 14.4* 10.8*  HGB 14.9 13.9  HCT 45.7 43.7  MCV 89.3 89.0  PLT 180 196   TELE  Sinus rhythm with ventricular pacing  Radiology/Studies  X-ray Chest Pa And Lateral  11/23/2014  CLINICAL DATA:  Congestive heart failure, cough. EXAM: CHEST  2 VIEW COMPARISON:  06/25/2014 FINDINGS: Dual lead cardiac pacemaker with battery apparatus within the right chest wall is stable. Cardiomediastinal silhouette is enlarged. Mediastinal contours appear intact. There is no evidence of focal airspace consolidation, pleural effusion or pneumothorax. Osseous structures are without acute abnormality. Lower cervical spine fusion is noted. Soft tissues are grossly normal. IMPRESSION: Enlarged cardiac silhouette, otherwise no evidence of acute cardiopulmonary process. Electronically Signed   By: Fidela Salisbury M.D.   On: 11/23/2014 18:49    ASSESSMENT AND PLAN   1. Unstable angina secondary to three-vessel coronary disease - S he also has 4c, ascending anesursym. - Tentative CABG planned for 11/29/14. No tight LAD stenosis.   2. Functioning biventricular pacemaker. [employer].  3. Obstructive sleep apnea.  - Does not tolerate CPAP.  4. Leukocytosis. She was afebrile. She is not having any dysuria but she states that her urine  has been darker.  - UA with trace leukocytosis, negative nitrite. Pending UA culture. WBC improved. For now continue to observe.  Repeat CBC tomorrow. She denies any purulent sputum however complains of dry cough. Will give mucinex --> If fever, will get cxr.   Jarrett Soho PA-C Pager [phone]  Patient seen and examined. Agree with assessment and plan. No recurrent chest pain on heparin. Tentative plan for CABG on Wednesday. At the time of cath I had reviewed images with Drs. Ellyn Hack and [place] who advised surgical approach since RCA anatomy complex with increased dissection risk  and MVCAD, although severity of LAD stenosis is less than DX lesions. Will also review today with colleagues.  Troy Sine, MD, [facility] 11/27/2014 9:29 AM  Addendum: Loreen Freud reviewed with Dr. [place]. Will plan for FFR of LAD this afternoon. If + then CABG on Wednesday. If negative, then consider PCI of RCA and OM and medical therapy for LAD and diagonal lesions. Discussed with patient who agrees to proceed. Will premedicate for contrast allergy.  Troy Sine, MD  11/27/2014  10:50 AM

## 2014-11-27 NOTE — Progress Notes (Addendum)
Patient Name: [name] Date of Encounter: 11/27/2014  Pt. Profile: Mrs. Hilgers is a 69 yo woman with a history of diastolic CHF, biventricular pacemaker, obesity, hypertension, asthma,sleep apnea, IBS, GERD and anxiety admitted with unstable angina.  SUBJECTIVE  Had mild chest discomfort early in morning. Currently CP free. Having cough. No SOB or palpitations.   CURRENT MEDS . amLODipine  10 mg Oral Daily  . aspirin EC  81 mg Oral Daily  . bisoprolol  5 mg Oral Daily  . budesonide-formoterol  2 puff Inhalation BID  . docusate sodium  100 mg Oral Daily  . furosemide  80 mg Intravenous BID  . isosorbide mononitrate  30 mg Oral Daily  . loratadine  10 mg Oral Daily  . pantoprazole  80 mg Oral Daily  . potassium chloride SA  20 mEq Oral Daily  . pravastatin  80 mg Oral QPM  . sodium chloride  3 mL Intravenous Q12H  . spironolactone  12.5 mg Oral BID    OBJECTIVE  Filed Vitals:   11/26/14 2106 11/27/14 0551 11/27/14 0554 11/27/14 0856  BP: 106/50  109/52   Pulse: 71  72   Temp:   97.7 F (36.5 C)   TempSrc: Oral  Oral   Resp: 18  18   Height:      Weight:  242 lb 5.4 oz (109.925 kg)    SpO2: 97%  96% 96%    Intake/Output Summary (Last 24 hours) at 11/27/14 0859 Last data filed at 11/27/14 0852  Gross per 24 hour  Intake 3143.54 ml  Output   2150 ml  Net 993.54 ml   Filed Weights   11/25/14 0420 11/26/14 0328 11/27/14 0551  Weight: 244 lb 8 oz (110.904 kg) 243 lb 14.4 oz (110.632 kg) 242 lb 5.4 oz (109.925 kg)    PHYSICAL EXAM  General: Pleasant, obese woman in NAD. Neuro: Alert and oriented X 3. Moves all extremities spontaneously. Psych: Normal affect. HEENT:  Normal  Neck: Supple without bruits or JVD. Lungs:  Resp regular and unlabored, CTA. Heart: RRR no s3, s4, or murmurs. Abdomen: Soft, non-tender, non-distended, BS + x 4.  Extremities: No clubbing, cyanosis or edema. DP/PT/Radials 2+ and equal bilaterally.  Accessory Clinical  Findings  CBC  Recent Labs  11/26/14 0535 11/27/14 0256  WBC 14.4* 10.8*  HGB 14.9 13.9  HCT 45.7 43.7  MCV 89.3 89.0  PLT 180 196   TELE  Sinus rhythm with ventricular pacing  Radiology/Studies  X-ray Chest Pa And Lateral  11/23/2014  CLINICAL DATA:  Congestive heart failure, cough. EXAM: CHEST  2 VIEW COMPARISON:  06/25/2014 FINDINGS: Dual lead cardiac pacemaker with battery apparatus within the right chest wall is stable. Cardiomediastinal silhouette is enlarged. Mediastinal contours appear intact. There is no evidence of focal airspace consolidation, pleural effusion or pneumothorax. Osseous structures are without acute abnormality. Lower cervical spine fusion is noted. Soft tissues are grossly normal. IMPRESSION: Enlarged cardiac silhouette, otherwise no evidence of acute cardiopulmonary process. Electronically Signed   By: Fidela Salisbury M.D.   On: 11/23/2014 18:49    ASSESSMENT AND PLAN   1. Unstable angina secondary to three-vessel coronary disease - S he also has 4c, ascending anesursym. - Tentative CABG planned for 11/29/14. No tight LAD stenosis.   2. Functioning biventricular pacemaker. [employer].  3. Obstructive sleep apnea.  - Does not tolerate CPAP.  4. Leukocytosis. She was afebrile. She is not having any dysuria but she states that her urine  has been darker.  - UA with trace leukocytosis, negative nitrite. Pending UA culture. WBC improved. For now continue to observe.  Repeat CBC tomorrow. She denies any purulent sputum however complains of dry cough. Will give mucinex --> If fever, will get cxr.   Jarrett Soho PA-C Pager [phone]  Patient seen and examined. Agree with assessment and plan. No recurrent chest pain on heparin. Tentative plan for CABG on Wednesday. At the time of cath I had reviewed images with Drs. Ellyn Hack and [place] who advised surgical approach since RCA anatomy complex with increased dissection risk  and MVCAD, although severity of LAD stenosis is less than DX lesions. Will also review today with colleagues.  Troy Sine, MD, [facility] 11/27/2014 9:29 AM  Addendum: Loreen Freud reviewed with Dr. [place]. Will plan for FFR of LAD this afternoon. If + then CABG on Wednesday. If negative, then consider PCI of RCA and OM and medical therapy for LAD and diagonal lesions. Discussed with patient who agrees to proceed. Will premedicate for contrast allergy.  Troy Sine, MD  11/27/2014  10:50 AM

## 2014-11-27 NOTE — Progress Notes (Signed)
ANTICOAGULATION CONSULT NOTE - Follow Up Consult  Pharmacy Consult for Heparin Indication: chest pain/ACS  Allergies  Allergen Reactions  . Ivp Dye [Iodinated Diagnostic Agents] Shortness Of Breath    No reaction to PO contrast with non-ionic dye.06-25-2014/rsm  . Shellfish-Derived Products Anaphylaxis  . Sulfa Antibiotics Shortness Of Breath  . Iodine Hives  . Zithromax [Azithromycin] Rash    Patient Measurements: Height: 5\' 5"  (165.1 cm) Weight: 242 lb 5.4 oz (109.925 kg) (scale C) IBW/kg (Calculated) : 57 Heparin Dosing Weight: 85 kg  Vital Signs: Temp: 97.7 F (36.5 C) (10/17 0554) Temp Source: Oral (10/17 0554) BP: 109/52 mmHg (10/17 0554) Pulse Rate: 72 (10/17 0554)  Labs:  Recent Labs  11/25/14 0446 11/25/14 0910 11/26/14 0535 11/27/14 0256  HGB 15.9*  --  14.9 13.9  HCT 49.0*  --  45.7 43.7  PLT 230  --  180 196  HEPARINUNFRC  --  0.34 0.54 0.30    Estimated Creatinine Clearance: 46.5 mL/min (by C-G formula based on Cr of 1.39).  Assessment: 14 YOF s/p cath 10/14 which showed 3V CAD.  HL  therapeutic at 0.30 on rate of 1450 units/hr. CBC stable, no bleeding reported. Nurse reports that she was not told that heparin was stopped for any amount of time overnight during handoff. Plan for possible CABG on Wednesday.  Goal of Therapy:  Heparin level 0.3-0.7 units/ml Monitor platelets by anticoagulation protocol: Yes   Plan:  - Increase heparin drip to 1500 units/hr  - 8 hour heparin level - Daily heparin level and CBC   Nicoletta Ba, PharmD, BCPS Clinical Pharmacist [phone]

## 2014-11-27 NOTE — Care Management Important Message (Signed)
Important Message  Patient Details  Name: [name] MRN: [id number] Date of Birth: September 15, 1945   Medicare Important Message Given:  Yes-second notification given    Delorse Lek 11/27/2014, 1:19 PM

## 2014-11-27 NOTE — Interval H&P Note (Signed)
History and Physical Interval Note:  11/27/2014 2:10 PM  [name]  has presented today for surgery, with the diagnosis of cad  The various methods of treatment have been discussed with the patient and family. After consideration of risks, benefits and other options for treatment, the patient has consented to  Procedure(s): Coronary Stent Intervention (N/A) as a surgical intervention .  The patient's history has been reviewed, patient examined, no change in status, stable for surgery.  I have reviewed the patient's chart and labs.  Questions were answered to the patient's satisfaction.    Cath Lab Visit (complete for each Cath Lab visit)  Clinical Evaluation Leading to the Procedure:   ACS: Yes.    Non-ACS:    Anginal Classification: CCS IV  Anti-ischemic medical therapy: Minimal Therapy (1 class of medications)  Non-Invasive Test Results: No non-invasive testing performed  Prior CABG: No previous CABG       Chelsea Primus 11/27/2014 2:10 PM

## 2014-11-27 NOTE — Progress Notes (Signed)
Day of Surgery Procedure(s): Intravascular Pressure Wire/FFR Study Subjective: No complaints this evening She is less anxious about surgery.  Objective: Vital signs in last 24 hours: Temp:  [97.7 F (36.5 C)-98.6 F (37 C)] 98.6 F (37 C) (10/17 1541) Pulse Rate:  [0-86] 86 (10/17 1754) Cardiac Rhythm:  [-] Ventricular paced (10/17 0900) Resp:  [0-117] 18 (10/17 1754) BP: (95-133)/(49-80) 116/72 mmHg (10/17 1754) SpO2:  [0 %-100 %] 98 % (10/17 1754) Weight:  [242 lb 5.4 oz (109.925 kg)] 242 lb 5.4 oz (109.925 kg) (10/17 0551)  Hemodynamic parameters for last 24 hours:    Intake/Output from previous day: 10/16 0701 - 10/17 0700 In: 3119.5 [P.O.:1440; I.V.:1679.5] Out: 2600 [Urine:2600] Intake/Output this shift: Total I/O In: 373 [P.O.:144; I.V.:229] Out: 1050 [Urine:1050]  General appearance: alert, cooperative and no distress Neurologic: intact Heart: regular rate and rhythm Lungs: diminished breath sounds bibasilar  Lab Results:  Recent Labs  11/26/14 0535 11/27/14 0256  WBC 14.4* 10.8*  HGB 14.9 13.9  HCT 45.7 43.7  PLT 180 196   BMET:  Recent Labs  11/27/14 1220  NA 138  K 4.6  CL 101  CO2 26  GLUCOSE 161*  BUN 35*  CREATININE 1.45*  CALCIUM 9.1    PT/INR: No results for input(s): LABPROT, INR in the last 72 hours. ABG    Component Value Date/Time   PHART 7.461* 04/26/2007 1235   HCO3 28.8* 04/26/2007 1236   TCO2 28 05/05/2011 1703   O2SAT 65.0 04/26/2007 1236   CBG (last 3)   Recent Labs  11/25/14 1614 11/25/14 2123 11/26/14 0610  GLUCAP 215* 130* 97    Assessment/Plan: S/P Procedure(s): Intravascular Pressure Wire/FFR Study -  Cath today- LAD FFR = 0.76 c/w significant stenosis CABG is best option I discussed the general nature of the procedure, the need for general anesthesia, the use of cardiopulmonary bypass, and the incisions to be used with Mrs. Junco. I reviewed the expected hospital stay, overall recovery and short and  long term outcomes. She does not remember our previous conversation re: risks. I again reviewed the indications, risks, benefits and alternatives. She understands the risks include, but are not limited to death, stroke, MI, DVT/PE, bleeding, possible need for transfusion, infections, cardiac arrhythmias, dislodgement of pacer leads, and other organ system dysfunction including respiratory, renal, or GI complications. She accepts the risks and agrees to proceed. OR Wednesday   LOS: 4 days    Melrose Nakayama 11/27/2014

## 2014-11-27 NOTE — Progress Notes (Signed)
PHARMACY NOTE  Consult :  Restart Heparin post-cath Indication :  Bridging until CABG  Heparin Dosing Wt :  85 kg  LABS :  Recent Labs  11/25/14 0446 11/25/14 0910 11/26/14 0535 11/27/14 0256 11/27/14 1220  HGB 15.9*  --  14.9 13.9  --   HCT 49.0*  --  45.7 43.7  --   PLT 230  --  180 196  --   HEPARINUNFRC  --  0.34 0.54 0.30  --   CREATININE  --   --   --   --  1.45*    MEDICATION: Scheduled:  Scheduled:  . amLODipine  10 mg Oral Daily  . aspirin EC  81 mg Oral Daily  . bisoprolol  5 mg Oral Daily  . budesonide-formoterol  2 puff Inhalation BID  . docusate sodium  100 mg Oral Daily  . furosemide  80 mg Intravenous BID  . guaiFENesin  600 mg Oral BID  . isosorbide mononitrate  30 mg Oral Daily  . loratadine  10 mg Oral Daily  . pantoprazole  80 mg Oral Daily  . potassium chloride SA  20 mEq Oral Daily  . pravastatin  80 mg Oral QPM  . sodium chloride  3 mL Intravenous Q12H  . sodium chloride  3 mL Intravenous Q12H  . spironolactone  12.5 mg Oral BID   Infusion[s]: Infusions:  . sodium chloride 10 mL/hr at 11/23/14 2253  . sodium chloride 1 mL/kg/hr (11/27/14 1555)   ASSESSMENT :  69 y.o. female s/p cardiac cath today which demonstrated 3 vessel disease and stenosis in the mid LAD.   Patient to be scheduled for CABG on Wednesday.  Heparin to be restarted as bridging until then.  No evidence of bleeding complications observed.  GOAL :  Heparin Level  0.3 - 0.7 units/ml  PLAN : 1. Restart Heparin, no bolus, 8 hours after sheath pulled at previous rate, 1500 units/hr. 2. Next Heparin level with AM labs, 3. Daily Heparin level, CBC while on Heparin.  Monitor for bleeding complications. Follow Platelet counts.  Marthenia Rolling,  Pharm.D   11/27/2014,  3:58 PM

## 2014-11-28 ENCOUNTER — Encounter (HOSPITAL_COMMUNITY): Payer: Self-pay | Admitting: Cardiology

## 2014-11-28 ENCOUNTER — Ambulatory Visit (HOSPITAL_COMMUNITY): Payer: Medicare Other

## 2014-11-28 DIAGNOSIS — I5031 Acute diastolic (congestive) heart failure: Secondary | ICD-10-CM

## 2014-11-28 DIAGNOSIS — I2510 Atherosclerotic heart disease of native coronary artery without angina pectoris: Secondary | ICD-10-CM

## 2014-11-28 DIAGNOSIS — Z01810 Encounter for preprocedural cardiovascular examination: Secondary | ICD-10-CM

## 2014-11-28 DIAGNOSIS — N289 Disorder of kidney and ureter, unspecified: Secondary | ICD-10-CM

## 2014-11-28 LAB — TYPE AND SCREEN
ABO/RH(D): O POS
Antibody Screen: NEGATIVE

## 2014-11-28 LAB — BASIC METABOLIC PANEL
Anion gap: 11 (ref 5–15)
BUN: 40 mg/dL — ABNORMAL HIGH (ref 6–20)
CO2: 26 mmol/L (ref 22–32)
Calcium: 8.8 mg/dL — ABNORMAL LOW (ref 8.9–10.3)
Chloride: 99 mmol/L — ABNORMAL LOW (ref 101–111)
Creatinine, Ser: 1.58 mg/dL — ABNORMAL HIGH (ref 0.44–1.00)
GFR calc Af Amer: 37 mL/min — ABNORMAL LOW (ref >60)
GFR calc non Af Amer: 32 mL/min — ABNORMAL LOW (ref >60)
Glucose, Bld: 243 mg/dL — ABNORMAL HIGH (ref 65–99)
Potassium: 3.8 mmol/L (ref 3.5–5.1)
Sodium: 136 mmol/L (ref 135–145)

## 2014-11-28 LAB — BLOOD GAS, ARTERIAL
Acid-Base Excess: 2.1 mmol/L — ABNORMAL HIGH (ref 0.0–2.0)
Bicarbonate: 26.3 mEq/L — ABNORMAL HIGH (ref 20.0–24.0)
Drawn by: 445891
FIO2: 0.21
O2 Saturation: 96.7 %
Patient temperature: 98.6
TCO2: 27.6 mmol/L (ref 0–100)
pCO2 arterial: 42.5 mmHg (ref 35.0–45.0)
pH, Arterial: 7.409 (ref 7.350–7.450)
pO2, Arterial: 87.3 mmHg (ref 80.0–100.0)

## 2014-11-28 LAB — PROTIME-INR
INR: 0.99 (ref 0.00–1.49)
Prothrombin Time: 13.3 seconds (ref 11.6–15.2)

## 2014-11-28 LAB — HEPARIN LEVEL (UNFRACTIONATED)
Heparin Unfractionated: 0.19 IU/mL — ABNORMAL LOW (ref 0.30–0.70)
Heparin Unfractionated: 0.3 IU/mL (ref 0.30–0.70)
Heparin Unfractionated: 0.57 IU/mL (ref 0.30–0.70)

## 2014-11-28 LAB — CBC
HCT: 44.2 % (ref 36.0–46.0)
Hemoglobin: 14.2 g/dL (ref 12.0–15.0)
MCH: 28.4 pg (ref 26.0–34.0)
MCHC: 32.1 g/dL (ref 30.0–36.0)
MCV: 88.4 fL (ref 78.0–100.0)
Platelets: 194 10*3/uL (ref 150–400)
RBC: 5 MIL/uL (ref 3.87–5.11)
RDW: 14 % (ref 11.5–15.5)
WBC: 10.7 10*3/uL — ABNORMAL HIGH (ref 4.0–10.5)

## 2014-11-28 LAB — APTT: aPTT: 51 seconds — ABNORMAL HIGH (ref 24–37)

## 2014-11-28 MED ORDER — BISACODYL 5 MG PO TBEC
5.0000 mg | DELAYED_RELEASE_TABLET | Freq: Once | ORAL | Status: AC
  Administered 2014-11-28: 5 mg via ORAL
  Filled 2014-11-28: qty 1

## 2014-11-28 MED ORDER — SODIUM CHLORIDE 0.9 % IV SOLN
INTRAVENOUS | Status: DC
  Filled 2014-11-28: qty 30

## 2014-11-28 MED ORDER — DEXTROSE 5 % IV SOLN
30.0000 ug/min | INTRAVENOUS | Status: AC
  Administered 2014-11-29: 20 ug/min via INTRAVENOUS
  Filled 2014-11-28: qty 2

## 2014-11-28 MED ORDER — PLASMA-LYTE 148 IV SOLN
INTRAVENOUS | Status: AC
  Administered 2014-11-29: 500 mL
  Filled 2014-11-28: qty 2.5

## 2014-11-28 MED ORDER — METOPROLOL TARTRATE 12.5 MG HALF TABLET
12.5000 mg | ORAL_TABLET | Freq: Once | ORAL | Status: AC
  Administered 2014-11-29: 12.5 mg via ORAL
  Filled 2014-11-28: qty 1

## 2014-11-28 MED ORDER — POTASSIUM CHLORIDE 2 MEQ/ML IV SOLN
80.0000 meq | INTRAVENOUS | Status: DC
  Filled 2014-11-28: qty 40

## 2014-11-28 MED ORDER — DOPAMINE-DEXTROSE 3.2-5 MG/ML-% IV SOLN
0.0000 ug/kg/min | INTRAVENOUS | Status: AC
  Administered 2014-11-29: 3 ug/kg/min via INTRAVENOUS
  Filled 2014-11-28: qty 250

## 2014-11-28 MED ORDER — INSULIN REGULAR HUMAN 100 UNIT/ML IJ SOLN
INTRAMUSCULAR | Status: AC
  Administered 2014-11-29: .9 [IU]/h via INTRAVENOUS
  Filled 2014-11-28: qty 2.5

## 2014-11-28 MED ORDER — EPINEPHRINE HCL 1 MG/ML IJ SOLN
0.0000 ug/min | INTRAVENOUS | Status: DC
  Filled 2014-11-28: qty 4

## 2014-11-28 MED ORDER — CHLORHEXIDINE GLUCONATE 4 % EX LIQD
60.0000 mL | Freq: Once | CUTANEOUS | Status: AC
  Administered 2014-11-29: 4 via TOPICAL
  Filled 2014-11-28: qty 60

## 2014-11-28 MED ORDER — TEMAZEPAM 15 MG PO CAPS: 15.0000 mg | ORAL_CAPSULE | Freq: Once | ORAL | Status: DC | PRN

## 2014-11-28 MED ORDER — CHLORHEXIDINE GLUCONATE 4 % EX LIQD
60.0000 mL | Freq: Once | CUTANEOUS | Status: AC
  Administered 2014-11-28: 4 via TOPICAL
  Filled 2014-11-28: qty 60

## 2014-11-28 MED ORDER — DEXTROSE 5 % IV SOLN
750.0000 mg | INTRAVENOUS | Status: DC
  Filled 2014-11-28: qty 750

## 2014-11-28 MED ORDER — MAGNESIUM SULFATE 50 % IJ SOLN
40.0000 meq | INTRAMUSCULAR | Status: DC
  Filled 2014-11-28: qty 10

## 2014-11-28 MED ORDER — DEXTROSE 5 % IV SOLN
1.5000 g | INTRAVENOUS | Status: AC
  Administered 2014-11-29: .75 g via INTRAVENOUS
  Administered 2014-11-29: 1.5 g via INTRAVENOUS
  Filled 2014-11-28: qty 1.5

## 2014-11-28 MED ORDER — SODIUM CHLORIDE 0.9 % IV SOLN
INTRAVENOUS | Status: AC
  Administered 2014-11-29: 69.8 mL/h via INTRAVENOUS
  Filled 2014-11-28: qty 40

## 2014-11-28 MED ORDER — NITROGLYCERIN IN D5W 200-5 MCG/ML-% IV SOLN
2.0000 ug/min | INTRAVENOUS | Status: AC
  Administered 2014-11-29: 3 ug/min via INTRAVENOUS
  Filled 2014-11-28: qty 250

## 2014-11-28 MED ORDER — CHLORHEXIDINE GLUCONATE 0.12 % MT SOLN
15.0000 mL | Freq: Once | OROMUCOSAL | Status: AC
  Administered 2014-11-29: 15 mL via OROMUCOSAL
  Filled 2014-11-28: qty 15

## 2014-11-28 MED ORDER — VANCOMYCIN HCL 10 G IV SOLR
1250.0000 mg | INTRAVENOUS | Status: AC
  Administered 2014-11-29: 1250 mg via INTRAVENOUS
  Filled 2014-11-28: qty 1250

## 2014-11-28 MED ORDER — DIAZEPAM 2 MG PO TABS
2.0000 mg | ORAL_TABLET | Freq: Once | ORAL | Status: AC
  Administered 2014-11-29: 2 mg via ORAL
  Filled 2014-11-28: qty 1

## 2014-11-28 MED ORDER — DEXMEDETOMIDINE HCL IN NACL 400 MCG/100ML IV SOLN
0.1000 ug/kg/h | INTRAVENOUS | Status: AC
Start: 2014-11-29 — End: 2014-11-29
  Administered 2014-11-29: .3 ug/kg/h via INTRAVENOUS
  Filled 2014-11-28: qty 100

## 2014-11-28 MED FILL — Verapamil HCl IV Soln 2.5 MG/ML: INTRAVENOUS | Qty: 2 | Status: AC

## 2014-11-28 NOTE — Progress Notes (Signed)
Inpatient Diabetes Program Recommendations  AACE/ADA: New Consensus Statement on Inpatient Glycemic Control (2015)  Target Ranges:  Prepandial:   less than 140 mg/dL      Peak postprandial:   less than 180 mg/dL (1-2 hours)      Critically ill patients:  140 - 180 mg/dL   Review of Glycemic Control  For CABG in am.  Results for KENIDEE, SHIRRELL (MRN [id number]) as of 11/28/2014 10:44  Ref. Range 11/25/2014 06:27 11/25/2014 11:21 11/25/2014 16:14 11/25/2014 21:23 11/26/2014 06:10  Glucose-Capillary Latest Ref Range: 65-99 mg/dL 183 (H) 163 (H) 215 (H) 130 (H) 97  Results for TANEE, MALINSKY (MRN [id number]) as of 11/28/2014 10:44  Ref. Range 11/23/2014 17:44  Hemoglobin A1C Latest Ref Range: 4.8-5.6 % 6.7 (H)   CBGs somewhat elevated with Solumedrol.  Inpatient Diabetes Program Recommendations:    Add Novolog sensitive tidwc and hs. Will be on GlucoStabilizer prior to surgery.  Will continue to follow. Thank you. Lorenda Peck, RD, LDN, CDE Inpatient Diabetes Coordinator [phone]

## 2014-11-28 NOTE — Progress Notes (Signed)
    Subjective: Mild CP after breakfast. She was at the sink washing up when I saw her.   Objective: Vital signs in last 24 hours: Temp:  [97.5 F (36.4 C)-98.6 F (37 C)] 98.4 F (36.9 C) (10/18 0446) Pulse Rate:  [0-88] 70 (10/18 0821) Resp:  [0-117] 18 (10/18 0821) BP: (95-133)/(49-80) 130/69 mmHg (10/18 0446) SpO2:  [0 %-100 %] 95 % (10/18 0821) Weight:  [242 lb 1.6 oz (109.816 kg)] 242 lb 1.6 oz (109.816 kg) (10/18 0446) Last BM Date: 11/26/14  Intake/Output from previous day: 10/17 0701 - 10/18 0700 In: 613 [P.O.:384; I.V.:229] Out: 1050 [Urine:1050] Intake/Output this shift:    Medications Scheduled Meds: . amLODipine  10 mg Oral Daily  . aspirin EC  81 mg Oral Daily  . bisoprolol  5 mg Oral Daily  . budesonide-formoterol  2 puff Inhalation BID  . docusate sodium  100 mg Oral Daily  . furosemide  80 mg Intravenous BID  . guaiFENesin  600 mg Oral BID  . isosorbide mononitrate  30 mg Oral Daily  . loratadine  10 mg Oral Daily  . pantoprazole  80 mg Oral Daily  . potassium chloride SA  20 mEq Oral Daily  . pravastatin  80 mg Oral QPM  . sodium chloride  3 mL Intravenous Q12H  . sodium chloride  3 mL Intravenous Q12H  . spironolactone  12.5 mg Oral BID   Continuous Infusions: . sodium chloride 10 mL/hr at 11/23/14 2253  . heparin 1,650 Units/hr (11/28/14 0455)   PRN Meds:.sodium chloride, sodium chloride, acetaminophen, albuterol, ALPRAZolam, alum & mag hydroxide-simeth, nitroGLYCERIN, ondansetron (ZOFRAN) IV, sodium chloride, sodium chloride, sodium chloride, zolpidem  PE: General appearance: alert, cooperative and no distress Lungs: clear to auscultation bilaterally Heart: regular rate and rhythm, S1, S2 normal, no murmur, click, rub or gallop Extremities: No LEE Pulses: 2+ and symmetric Skin: Warm and dry.  Ecchymosis at right radial cath site.  2+ radial pulse.  Neurologic: Grossly normal  Lab Results:   Recent Labs  11/26/14 0535 11/27/14 0256  11/28/14 0322  WBC 14.4* 10.8* 10.7*  HGB 14.9 13.9 14.2  HCT 45.7 43.7 44.2  PLT 180 196 194   BMET  Recent Labs  11/27/14 1220 11/28/14 0322  NA 138 136  K 4.6 3.8  CL 101 99*  CO2 26 26  GLUCOSE 161* 243*  BUN 35* 40*  CREATININE 1.45* 1.58*  CALCIUM 9.1 8.8*    Assessment/Plan    Active Problems:   Acute diastolic HF (heart failure) (HCC)   CAD, multiple vessel  1. Unstable angina secondary to three-vessel coronary disease - She also has 4c, ascending anesursym. - CABG planned for 11/29/14.   -cath yesterday: FFR of mid LAD 0.76  2. Functioning biventricular pacemaker. [employer].  3. Obstructive sleep apnea.  - Does not tolerate CPAP.  4. Leukocytosis.  Stable. 10.7. She was afebrile. She is not having any dysuria but she states that her urine has been darker.  - UA with trace leukocytosis, negative nitrite. Pending UA culture. WBC improved. For now continue to observe.    LOS: 5 days    HAGER, BRYAN PA-C 11/28/2014 8:49 AM   Patient seen and examined. Agree with assessment and plan. No recurrent chest pain. FFR of LAD revealed physiologically significant stenosis at 0.76. Plan for multivessel CABG tomorrow.  With increased Creatinine will hold lasix; no sign of CHF on exam.   Troy Sine, MD, [facility] 11/28/2014 9:24 AM

## 2014-11-28 NOTE — Progress Notes (Signed)
ANTICOAGULATION CONSULT NOTE - Follow Up Consult  Pharmacy Consult for Heparin  Indication: Awaiting CABG  Allergies  Allergen Reactions  . Ivp Dye [Iodinated Diagnostic Agents] Shortness Of Breath    No reaction to PO contrast with non-ionic dye.06-25-2014/rsm  . Shellfish-Derived Products Anaphylaxis  . Sulfa Antibiotics Shortness Of Breath  . Iodine Hives  . Zithromax [Azithromycin] Rash    Patient Measurements: Height: 5\' 5"  (165.1 cm) Weight: 242 lb 5.4 oz (109.925 kg) (scale C) IBW/kg (Calculated) : 57  Vital Signs: Temp: 97.5 F (36.4 C) (10/17 2211) Temp Source: Oral (10/17 2211) BP: 98/51 mmHg (10/17 2211) Pulse Rate: 88 (10/17 2211)  Labs:  Recent Labs  11/26/14 0535 11/27/14 0256 11/27/14 1220 11/28/14 0322  HGB 14.9 13.9  --  14.2  HCT 45.7 43.7  --  44.2  PLT 180 196  --  194  HEPARINUNFRC 0.54 0.30  --  0.19*  CREATININE  --   --  1.45*  --     Estimated Creatinine Clearance: 44.6 mL/min (by C-G formula based on Cr of 1.45).   Assessment: Sub-therapeutic heparin level, CABG on Wed  Goal of Therapy:  Heparin level 0.3-0.7 units/ml Monitor platelets by anticoagulation protocol: Yes   Plan:  -Increase heparin to 1650 units/hr -1200 HL  Prospero Mahnke 11/28/2014,4:46 AM

## 2014-11-28 NOTE — Progress Notes (Signed)
ANTICOAGULATION CONSULT NOTE - Follow Up Consult  Pharmacy Consult for Heparin  Indication: Awaiting CABG  Allergies  Allergen Reactions  . Ivp Dye [Iodinated Diagnostic Agents] Shortness Of Breath    No reaction to PO contrast with non-ionic dye.06-25-2014/rsm  . Shellfish-Derived Products Anaphylaxis  . Sulfa Antibiotics Shortness Of Breath  . Iodine Hives  . Zithromax [Azithromycin] Rash    Patient Measurements: Height: 5\' 5"  (165.1 cm) Weight: 242 lb 1.6 oz (109.816 kg) IBW/kg (Calculated) : 57  Heparin Dosing Weight: 85 kg  Vital Signs: Temp: 97.8 F (36.6 C) (10/18 1153) Temp Source: Oral (10/18 1153) BP: 98/56 mmHg (10/18 1153) Pulse Rate: 68 (10/18 1153)  Labs:  Recent Labs  11/26/14 0535 11/27/14 0256 11/27/14 1220 11/28/14 0322 11/28/14 1142  HGB 14.9 13.9  --  14.2  --   HCT 45.7 43.7  --  44.2  --   PLT 180 196  --  194  --   HEPARINUNFRC 0.54 0.30  --  0.19* 0.30  CREATININE  --   --  1.45* 1.58*  --     Estimated Creatinine Clearance: 40.8 mL/min (by C-G formula based on Cr of 1.58).   Assessment: 70 y/o female on IV heparin for CAD awaiting CABG tomorrow. Heparin level is therapeutic on the low end of goal - will increase a little to keep >0.3. No bleeding noted, CBC is stable.  Goal of Therapy:  Heparin level 0.3-0.7 units/ml Monitor platelets by anticoagulation protocol: Yes   Plan:  - Increase heparin drip to 1750 units/hr - Confirmatory heparin level in 8 hr - Daily heparin level and CBC  [facility], Cassel.D., BCPS Clinical Pharmacist Pager: [phone] 11/28/2014 1:48 PM

## 2014-11-28 NOTE — Clinical Documentation Improvement (Deleted)
Cardiology Cardiothoracic  Abnormal Lab/Test Results:   Component      BUN Creatinine  Latest Ref Rng      6 - 20 mg/dL 0.44 - 1.00 mg/dL  11/23/2014     5:44 PM 15 1.20 (H)  11/24/2014     4:32 AM 17 1.39 (H)  11/27/2014      35 (H) 1.45 (H)  11/28/2014      40 (H) 1.58 (H)   Component      EGFR (Non-African Amer.)  Latest Ref Rng      >60 mL/min  11/23/2014     5:44 PM 45 (L)  11/24/2014     4:32 AM 37 (L)  11/27/2014      36 (L)  11/28/2014      32 (L)   Possible Clinical Conditions associated with below indicators  Chronic kidney disease (if present, please specify stage)  Acute kidney injury  Acute kidney injury on ckd (if CKD present, please specify stage)  Other Condition  Cannot Clinically Determine   Please exercise your independent, professional judgment when responding. A specific answer is not anticipated or expected. Please update your documentation within the medical record to reflect your response to this query.  Thank you, Mateo Flow, RN [phone] Clinical Documentation Specialist

## 2014-11-28 NOTE — Progress Notes (Signed)
CARDIAC REHAB PHASE I   PRE:  Rate/Rhythm: 72 [paced  BP:  Sitting: 119/71        SaO2: 98 RA  MODE:  Ambulation: 200 ft   POST:  Rate/Rhythm: 71 paced  BP:  Sitting: 119/74         SaO2: 95 RA  Pt ambulated 200 ft on RA, hand held assist, fairly steady gait, tolerated fair. Pt c/o DOE, denies CP, dizziness, declined rest stop. Pt reports very limited activity tolerance recently. Pre-op CABG surgery education completed. Reviewed IS, sternal precautions, and activity progression. Gave pt cardiac surgery booklet and OHS guidelines. Pt verbalized understanding. Pt declines OHS video at this time. Pt to recliner after walk, call bell within reach.   [phone]  Lenna Sciara, RN, BSN 11/28/2014 11:51 AM

## 2014-11-28 NOTE — Progress Notes (Signed)
First Hibiclens bath done.

## 2014-11-28 NOTE — Progress Notes (Signed)
VASCULAR LAB PRELIMINARY  PRELIMINARY  PRELIMINARY  PRELIMINARY  Pre-op Cardiac Surgery  Carotid Findings:  Bilateral ICAs 1-39% stenosis.  Upper Extremity Right Left  Brachial Pressures 139 131  Radial Waveforms Normal Normal  Ulnar Waveforms Normal  Normal  Palmar Arch (Allen's Test) WNL WNL    Lower  Extremity Right Left  Dorsalis Pedis 138 138  Posterior Tibial 138 138  Ankle/Brachial Indices 0.99 0.99    Findings:  ABIs WNL.   Alla German, RVT 11/28/2014, 10:59 AM

## 2014-11-29 ENCOUNTER — Encounter (HOSPITAL_COMMUNITY): Payer: Self-pay | Admitting: Certified Registered Nurse Anesthetist

## 2014-11-29 ENCOUNTER — Inpatient Hospital Stay (HOSPITAL_COMMUNITY): Payer: Medicare Other | Admitting: Certified Registered Nurse Anesthetist

## 2014-11-29 ENCOUNTER — Encounter (HOSPITAL_COMMUNITY)
Admission: AD | Disposition: A | Discharge status: Skilled Nursing Facility | Payer: Medicare Other | Source: Ambulatory Visit | Attending: Thoracic Surgery (Cardiothoracic Vascular Surgery)

## 2014-11-29 ENCOUNTER — Inpatient Hospital Stay (HOSPITAL_COMMUNITY): Payer: Medicare Other

## 2014-11-29 DIAGNOSIS — I25110 Atherosclerotic heart disease of native coronary artery with unstable angina pectoris: Secondary | ICD-10-CM

## 2014-11-29 DIAGNOSIS — Z951 Presence of aortocoronary bypass graft: Secondary | ICD-10-CM

## 2014-11-29 HISTORY — PX: TEE WITHOUT CARDIOVERSION: SHX5443

## 2014-11-29 HISTORY — PX: CORONARY ARTERY BYPASS GRAFT: SHX141

## 2014-11-29 LAB — POCT I-STAT, CHEM 8
BUN: 44 mg/dL — ABNORMAL HIGH (ref 6–20)
BUN: 42 mg/dL — ABNORMAL HIGH (ref 6–20)
BUN: 38 mg/dL — ABNORMAL HIGH (ref 6–20)
BUN: 36 mg/dL — ABNORMAL HIGH (ref 6–20)
BUN: 38 mg/dL — ABNORMAL HIGH (ref 6–20)
BUN: 30 mg/dL — ABNORMAL HIGH (ref 6–20)
Calcium, Ion: 1.22 mmol/L (ref 1.13–1.30)
Calcium, Ion: 1.22 mmol/L (ref 1.13–1.30)
Calcium, Ion: 0.95 mmol/L — ABNORMAL LOW (ref 1.13–1.30)
Calcium, Ion: 0.92 mmol/L — ABNORMAL LOW (ref 1.13–1.30)
Calcium, Ion: 1.01 mmol/L — ABNORMAL LOW (ref 1.13–1.30)
Calcium, Ion: 1.07 mmol/L — ABNORMAL LOW (ref 1.13–1.30)
Chloride: 101 mmol/L (ref 101–111)
Chloride: 101 mmol/L (ref 101–111)
Chloride: 95 mmol/L — ABNORMAL LOW (ref 101–111)
Chloride: 98 mmol/L — ABNORMAL LOW (ref 101–111)
Chloride: 98 mmol/L — ABNORMAL LOW (ref 101–111)
Chloride: 108 mmol/L (ref 101–111)
Creatinine, Ser: 1.3 mg/dL — ABNORMAL HIGH (ref 0.44–1.00)
Creatinine, Ser: 1.3 mg/dL — ABNORMAL HIGH (ref 0.44–1.00)
Creatinine, Ser: 1.1 mg/dL — ABNORMAL HIGH (ref 0.44–1.00)
Creatinine, Ser: 1 mg/dL (ref 0.44–1.00)
Creatinine, Ser: 0.9 mg/dL (ref 0.44–1.00)
Creatinine, Ser: 1.1 mg/dL — ABNORMAL HIGH (ref 0.44–1.00)
Glucose, Bld: 104 mg/dL — ABNORMAL HIGH (ref 65–99)
Glucose, Bld: 120 mg/dL — ABNORMAL HIGH (ref 65–99)
Glucose, Bld: 108 mg/dL — ABNORMAL HIGH (ref 65–99)
Glucose, Bld: 159 mg/dL — ABNORMAL HIGH (ref 65–99)
Glucose, Bld: 170 mg/dL — ABNORMAL HIGH (ref 65–99)
Glucose, Bld: 121 mg/dL — ABNORMAL HIGH (ref 65–99)
HCT: 45 % (ref 36.0–46.0)
HCT: 41 % (ref 36.0–46.0)
HCT: 29 % — ABNORMAL LOW (ref 36.0–46.0)
HCT: 30 % — ABNORMAL LOW (ref 36.0–46.0)
HCT: 32 % — ABNORMAL LOW (ref 36.0–46.0)
HCT: 53 % — ABNORMAL HIGH (ref 36.0–46.0)
Hemoglobin: 15.3 g/dL — ABNORMAL HIGH (ref 12.0–15.0)
Hemoglobin: 13.9 g/dL (ref 12.0–15.0)
Hemoglobin: 9.9 g/dL — ABNORMAL LOW (ref 12.0–15.0)
Hemoglobin: 10.2 g/dL — ABNORMAL LOW (ref 12.0–15.0)
Hemoglobin: 10.9 g/dL — ABNORMAL LOW (ref 12.0–15.0)
Hemoglobin: 18 g/dL — ABNORMAL HIGH (ref 12.0–15.0)
Potassium: 4.1 mmol/L (ref 3.5–5.1)
Potassium: 4 mmol/L (ref 3.5–5.1)
Potassium: 5.1 mmol/L (ref 3.5–5.1)
Potassium: 5.9 mmol/L — ABNORMAL HIGH (ref 3.5–5.1)
Potassium: 4.7 mmol/L (ref 3.5–5.1)
Potassium: 4.3 mmol/L (ref 3.5–5.1)
Sodium: 137 mmol/L (ref 135–145)
Sodium: 137 mmol/L (ref 135–145)
Sodium: 131 mmol/L — ABNORMAL LOW (ref 135–145)
Sodium: 133 mmol/L — ABNORMAL LOW (ref 135–145)
Sodium: 135 mmol/L (ref 135–145)
Sodium: 137 mmol/L (ref 135–145)
TCO2: 26 mmol/L (ref 0–100)
TCO2: 25 mmol/L (ref 0–100)
TCO2: 29 mmol/L (ref 0–100)
TCO2: 26 mmol/L (ref 0–100)
TCO2: 23 mmol/L (ref 0–100)
TCO2: 22 mmol/L (ref 0–100)

## 2014-11-29 LAB — POCT I-STAT 3, ART BLOOD GAS (G3+)
Acid-Base Excess: 8 mmol/L — ABNORMAL HIGH (ref 0.0–2.0)
Acid-base deficit: 4 mmol/L — ABNORMAL HIGH (ref 0.0–2.0)
Bicarbonate: 31.1 mEq/L — ABNORMAL HIGH (ref 20.0–24.0)
Bicarbonate: 25.1 mEq/L — ABNORMAL HIGH (ref 20.0–24.0)
Bicarbonate: 23.3 mEq/L (ref 20.0–24.0)
Bicarbonate: 25.3 mEq/L — ABNORMAL HIGH (ref 20.0–24.0)
O2 Saturation: 100 %
O2 Saturation: 94 %
O2 Saturation: 83 %
O2 Saturation: 92 %
Patient temperature: 35
Patient temperature: 37.1
Patient temperature: 36.6
Patient temperature: 36.7
TCO2: 32 mmol/L (ref 0–100)
TCO2: 26 mmol/L (ref 0–100)
TCO2: 25 mmol/L (ref 0–100)
TCO2: 27 mmol/L (ref 0–100)
pCO2 arterial: 32.3 mmHg — ABNORMAL LOW (ref 35.0–45.0)
pCO2 arterial: 40.1 mmHg (ref 35.0–45.0)
pCO2 arterial: 49.3 mmHg — ABNORMAL HIGH (ref 35.0–45.0)
pCO2 arterial: 42.4 mmHg (ref 35.0–45.0)
pH, Arterial: 7.585 — ABNORMAL HIGH (ref 7.350–7.450)
pH, Arterial: 7.405 (ref 7.350–7.450)
pH, Arterial: 7.28 — ABNORMAL LOW (ref 7.350–7.450)
pH, Arterial: 7.382 (ref 7.350–7.450)
pO2, Arterial: 403 mmHg — ABNORMAL HIGH (ref 80.0–100.0)
pO2, Arterial: 69 mmHg — ABNORMAL LOW (ref 80.0–100.0)
pO2, Arterial: 53 mmHg — ABNORMAL LOW (ref 80.0–100.0)
pO2, Arterial: 65 mmHg — ABNORMAL LOW (ref 80.0–100.0)

## 2014-11-29 LAB — SURGICAL PCR SCREEN
MRSA, PCR: NEGATIVE
Staphylococcus aureus: NEGATIVE

## 2014-11-29 LAB — CBC
HCT: 30.4 % — ABNORMAL LOW (ref 36.0–46.0)
HCT: 35.2 % — ABNORMAL LOW (ref 36.0–46.0)
HCT: 43.3 % (ref 36.0–46.0)
HCT: 43.8 % (ref 36.0–46.0)
Hemoglobin: 11.1 g/dL — ABNORMAL LOW (ref 12.0–15.0)
Hemoglobin: 13.9 g/dL (ref 12.0–15.0)
Hemoglobin: 13.9 g/dL (ref 12.0–15.0)
Hemoglobin: 9.8 g/dL — ABNORMAL LOW (ref 12.0–15.0)
MCH: 28.2 pg (ref 26.0–34.0)
MCH: 28.3 pg (ref 26.0–34.0)
MCH: 28.4 pg (ref 26.0–34.0)
MCH: 28.5 pg (ref 26.0–34.0)
MCHC: 31.5 g/dL (ref 30.0–36.0)
MCHC: 31.7 g/dL (ref 30.0–36.0)
MCHC: 32.1 g/dL (ref 30.0–36.0)
MCHC: 32.2 g/dL (ref 30.0–36.0)
MCV: 88.1 fL (ref 78.0–100.0)
MCV: 88.9 fL (ref 78.0–100.0)
MCV: 89.2 fL (ref 78.0–100.0)
MCV: 89.3 fL (ref 78.0–100.0)
Platelets: 128 10*3/uL — ABNORMAL LOW (ref 150–400)
Platelets: 159 10*3/uL (ref 150–400)
Platelets: 184 10*3/uL (ref 150–400)
Platelets: 197 10*3/uL (ref 150–400)
RBC: 3.45 MIL/uL — ABNORMAL LOW (ref 3.87–5.11)
RBC: 3.94 MIL/uL (ref 3.87–5.11)
RBC: 4.87 MIL/uL (ref 3.87–5.11)
RBC: 4.91 MIL/uL (ref 3.87–5.11)
RDW: 14 % (ref 11.5–15.5)
RDW: 14.1 % (ref 11.5–15.5)
RDW: 14.1 % (ref 11.5–15.5)
RDW: 14.1 % (ref 11.5–15.5)
WBC: 11.9 10*3/uL — ABNORMAL HIGH (ref 4.0–10.5)
WBC: 12.6 10*3/uL — ABNORMAL HIGH (ref 4.0–10.5)
WBC: 13.7 10*3/uL — ABNORMAL HIGH (ref 4.0–10.5)
WBC: 19.9 10*3/uL — ABNORMAL HIGH (ref 4.0–10.5)

## 2014-11-29 LAB — PROTIME-INR
INR: 1.87 — ABNORMAL HIGH (ref 0.00–1.49)
INR: 1.3 (ref 0.00–1.49)
Prothrombin Time: 21.4 seconds — ABNORMAL HIGH (ref 11.6–15.2)
Prothrombin Time: 16.4 seconds — ABNORMAL HIGH (ref 11.6–15.2)

## 2014-11-29 LAB — APTT
aPTT: 35 seconds (ref 24–37)
aPTT: 29 seconds (ref 24–37)

## 2014-11-29 LAB — POCT I-STAT 4, (NA,K, GLUC, HGB,HCT)
Glucose, Bld: 156 mg/dL — ABNORMAL HIGH (ref 65–99)
HCT: 35 % — ABNORMAL LOW (ref 36.0–46.0)
Hemoglobin: 11.9 g/dL — ABNORMAL LOW (ref 12.0–15.0)
Potassium: 4.4 mmol/L (ref 3.5–5.1)
Sodium: 139 mmol/L (ref 135–145)

## 2014-11-29 LAB — POCT I-STAT GLUCOSE
Glucose, Bld: 141 mg/dL — ABNORMAL HIGH (ref 65–99)
Operator id: 3390

## 2014-11-29 LAB — ABO/RH: ABO/RH(D): O POS

## 2014-11-29 LAB — MAGNESIUM
Magnesium: 2.2 mg/dL (ref 1.7–2.4)
Magnesium: 3.8 mg/dL — ABNORMAL HIGH (ref 1.7–2.4)

## 2014-11-29 LAB — POCT I-STAT 3, VENOUS BLOOD GAS (G3P V)
Acid-Base Excess: 2 mmol/L (ref 0.0–2.0)
Bicarbonate: 26.2 mEq/L — ABNORMAL HIGH (ref 20.0–24.0)
O2 Saturation: 75 %
Patient temperature: 35
TCO2: 27 mmol/L (ref 0–100)
pCO2, Ven: 35.5 mmHg — ABNORMAL LOW (ref 45.0–50.0)
pH, Ven: 7.468 — ABNORMAL HIGH (ref 7.250–7.300)
pO2, Ven: 33 mmHg (ref 30.0–45.0)

## 2014-11-29 LAB — PLATELET COUNT: Platelets: 103 10*3/uL — ABNORMAL LOW (ref 150–400)

## 2014-11-29 LAB — BASIC METABOLIC PANEL
Anion gap: 10 (ref 5–15)
BUN: 47 mg/dL — ABNORMAL HIGH (ref 6–20)
CO2: 30 mmol/L (ref 22–32)
Calcium: 9.2 mg/dL (ref 8.9–10.3)
Chloride: 98 mmol/L — ABNORMAL LOW (ref 101–111)
Creatinine, Ser: 1.72 mg/dL — ABNORMAL HIGH (ref 0.44–1.00)
GFR calc Af Amer: 34 mL/min — ABNORMAL LOW (ref >60)
GFR calc non Af Amer: 29 mL/min — ABNORMAL LOW (ref >60)
Glucose, Bld: 159 mg/dL — ABNORMAL HIGH (ref 65–99)
Potassium: 4.3 mmol/L (ref 3.5–5.1)
Sodium: 138 mmol/L (ref 135–145)

## 2014-11-29 LAB — CREATININE, SERUM
Creatinine, Ser: 1.23 mg/dL — ABNORMAL HIGH (ref 0.44–1.00)
GFR calc Af Amer: 50 mL/min — ABNORMAL LOW (ref >60)
GFR calc non Af Amer: 43 mL/min — ABNORMAL LOW (ref >60)

## 2014-11-29 LAB — HEMOGLOBIN AND HEMATOCRIT, BLOOD
HCT: 29.7 % — ABNORMAL LOW (ref 36.0–46.0)
Hemoglobin: 9.5 g/dL — ABNORMAL LOW (ref 12.0–15.0)

## 2014-11-29 SURGERY — CORONARY ARTERY BYPASS GRAFTING (CABG)
Anesthesia: General | Site: Chest

## 2014-11-29 MED ORDER — CEFUROXIME SODIUM 1.5 G IJ SOLR
1.5000 g | Freq: Two times a day (BID) | INTRAMUSCULAR | Status: AC
  Administered 2014-11-30 – 2014-12-01 (×4): 1.5 g via INTRAVENOUS
  Filled 2014-11-29 (×4): qty 1.5

## 2014-11-29 MED ORDER — LACTATED RINGERS IV SOLN
500.0000 mL | Freq: Once | INTRAVENOUS | Status: AC | PRN
  Administered 2014-11-30: 500 mL via INTRAVENOUS

## 2014-11-29 MED ORDER — FENTANYL CITRATE (PF) 250 MCG/5ML IJ SOLN
INTRAMUSCULAR | Status: AC
  Filled 2014-11-29: qty 5

## 2014-11-29 MED ORDER — BISACODYL 5 MG PO TBEC
10.0000 mg | DELAYED_RELEASE_TABLET | Freq: Every day | ORAL | Status: DC
  Administered 2014-11-30 – 2014-12-05 (×5): 10 mg via ORAL
  Filled 2014-11-29 (×5): qty 2

## 2014-11-29 MED ORDER — POTASSIUM CHLORIDE 10 MEQ/50ML IV SOLN: 10.0000 meq | INTRAVENOUS | Status: AC

## 2014-11-29 MED ORDER — ACETAMINOPHEN 650 MG RE SUPP
650.0000 mg | Freq: Once | RECTAL | Status: AC
  Administered 2014-11-29: 650 mg via RECTAL

## 2014-11-29 MED ORDER — CALCIUM CHLORIDE 10 % IV SOLN
INTRAVENOUS | Status: DC | PRN
  Administered 2014-11-29 (×4): 250 mg via INTRAVENOUS

## 2014-11-29 MED ORDER — HEPARIN SODIUM (PORCINE) 1000 UNIT/ML IJ SOLN
INTRAMUSCULAR | Status: AC
  Filled 2014-11-29: qty 1

## 2014-11-29 MED ORDER — SODIUM CHLORIDE 0.9 % IJ SOLN
3.0000 mL | Freq: Two times a day (BID) | INTRAMUSCULAR | Status: DC
  Administered 2014-11-30 – 2014-12-03 (×6): 3 mL via INTRAVENOUS

## 2014-11-29 MED ORDER — SUCCINYLCHOLINE CHLORIDE 20 MG/ML IJ SOLN
INTRAMUSCULAR | Status: AC
  Filled 2014-11-29: qty 1

## 2014-11-29 MED ORDER — INSULIN REGULAR BOLUS VIA INFUSION
0.0000 [IU] | Freq: Three times a day (TID) | INTRAVENOUS | Status: DC
  Administered 2014-11-30 – 2014-12-01 (×3): 4 [IU] via INTRAVENOUS
  Filled 2014-11-29: qty 10

## 2014-11-29 MED ORDER — SODIUM CHLORIDE 0.9 % IV SOLN
INTRAVENOUS | Status: DC
  Filled 2014-11-29: qty 2.5

## 2014-11-29 MED ORDER — PHENYLEPHRINE HCL 10 MG/ML IJ SOLN
0.0000 ug/min | INTRAVENOUS | Status: DC
  Administered 2014-12-01: 5 ug/min via INTRAVENOUS
  Administered 2014-12-02: 40 ug/min via INTRAVENOUS
  Administered 2014-12-03: 20 ug/min via INTRAVENOUS
  Filled 2014-11-29 (×5): qty 2

## 2014-11-29 MED ORDER — HEPARIN SODIUM (PORCINE) 1000 UNIT/ML IJ SOLN
INTRAMUSCULAR | Status: DC | PRN
  Administered 2014-11-29: 2000 [IU] via INTRAVENOUS
  Administered 2014-11-29: 35000 [IU] via INTRAVENOUS

## 2014-11-29 MED ORDER — HEMOSTATIC AGENTS (NO CHARGE) OPTIME
TOPICAL | Status: DC | PRN
  Administered 2014-11-29 (×4): 1 via TOPICAL

## 2014-11-29 MED ORDER — PHENYLEPHRINE 40 MCG/ML (10ML) SYRINGE FOR IV PUSH (FOR BLOOD PRESSURE SUPPORT)
PREFILLED_SYRINGE | INTRAVENOUS | Status: AC
  Filled 2014-11-29: qty 20

## 2014-11-29 MED ORDER — SUCCINYLCHOLINE 20MG/ML (10ML) SYRINGE FOR MEDFUSION PUMP - OPTIME
INTRAMUSCULAR | Status: DC | PRN
  Administered 2014-11-29: 120 mg via INTRAVENOUS

## 2014-11-29 MED ORDER — CHLORHEXIDINE GLUCONATE 0.12% ORAL RINSE (MEDLINE KIT)
15.0000 mL | Freq: Two times a day (BID) | OROMUCOSAL | Status: DC
  Administered 2014-11-29 – 2014-12-04 (×7): 15 mL via OROMUCOSAL

## 2014-11-29 MED ORDER — TRAMADOL HCL 50 MG PO TABS
50.0000 mg | ORAL_TABLET | ORAL | Status: DC | PRN
  Administered 2014-12-02 – 2014-12-03 (×2): 50 mg via ORAL
  Filled 2014-11-29 (×2): qty 1

## 2014-11-29 MED ORDER — PROTAMINE SULFATE 10 MG/ML IV SOLN
INTRAVENOUS | Status: DC | PRN
  Administered 2014-11-29: 360 mg via INTRAVENOUS

## 2014-11-29 MED ORDER — SODIUM CHLORIDE 0.9 % IV SOLN: INTRAVENOUS | Status: DC

## 2014-11-29 MED ORDER — LACTATED RINGERS IV SOLN
INTRAVENOUS | Status: DC | PRN
  Administered 2014-11-29: 08:00:00 via INTRAVENOUS

## 2014-11-29 MED ORDER — MORPHINE SULFATE (PF) 2 MG/ML IV SOLN
1.0000 mg | INTRAVENOUS | Status: AC | PRN
  Administered 2014-11-30: 2 mg via INTRAVENOUS
  Filled 2014-11-29: qty 1

## 2014-11-29 MED ORDER — LACTATED RINGERS IV SOLN: INTRAVENOUS | Status: DC

## 2014-11-29 MED ORDER — EPHEDRINE SULFATE 50 MG/ML IJ SOLN
INTRAMUSCULAR | Status: AC
  Filled 2014-11-29: qty 1

## 2014-11-29 MED ORDER — ANTISEPTIC ORAL RINSE SOLUTION (CORINZ)
7.0000 mL | Freq: Four times a day (QID) | OROMUCOSAL | Status: DC
  Administered 2014-11-29 – 2014-12-04 (×14): 7 mL via OROMUCOSAL

## 2014-11-29 MED ORDER — LEVALBUTEROL HCL 0.63 MG/3ML IN NEBU
0.6300 mg | INHALATION_SOLUTION | Freq: Four times a day (QID) | RESPIRATORY_TRACT | Status: DC
  Administered 2014-11-29 – 2014-12-04 (×15): 0.63 mg via RESPIRATORY_TRACT
  Filled 2014-11-29 (×34): qty 3

## 2014-11-29 MED ORDER — MORPHINE SULFATE (PF) 2 MG/ML IV SOLN
2.0000 mg | INTRAVENOUS | Status: DC | PRN
  Administered 2014-11-30 (×2): 4 mg via INTRAVENOUS
  Administered 2014-11-30: 2 mg via INTRAVENOUS
  Administered 2014-12-01: 4 mg via INTRAVENOUS
  Filled 2014-11-29: qty 1
  Filled 2014-11-29 (×3): qty 2
  Filled 2014-11-29: qty 1

## 2014-11-29 MED ORDER — ROCURONIUM BROMIDE 50 MG/5ML IV SOLN
INTRAVENOUS | Status: AC
  Filled 2014-11-29: qty 5

## 2014-11-29 MED ORDER — 0.9 % SODIUM CHLORIDE (POUR BTL) OPTIME
TOPICAL | Status: DC | PRN
  Administered 2014-11-29: 4000 mL

## 2014-11-29 MED ORDER — SODIUM CHLORIDE 0.9 % IJ SOLN: 3.0000 mL | INTRAMUSCULAR | Status: DC | PRN

## 2014-11-29 MED ORDER — PHENYLEPHRINE HCL 10 MG/ML IJ SOLN
INTRAMUSCULAR | Status: DC | PRN
  Administered 2014-11-29: 80 ug via INTRAVENOUS
  Administered 2014-11-29 (×2): 40 ug via INTRAVENOUS
  Administered 2014-11-29: 80 ug via INTRAVENOUS
  Administered 2014-11-29: 40 ug via INTRAVENOUS

## 2014-11-29 MED ORDER — SODIUM CHLORIDE 0.9 % IV SOLN
0.5000 g/h | INTRAVENOUS | Status: DC
  Filled 2014-11-29: qty 20

## 2014-11-29 MED ORDER — METOPROLOL TARTRATE 25 MG/10 ML ORAL SUSPENSION
12.5000 mg | Freq: Two times a day (BID) | ORAL | Status: DC
  Filled 2014-11-29 (×5): qty 5

## 2014-11-29 MED ORDER — ACETAMINOPHEN 500 MG PO TABS
1000.0000 mg | ORAL_TABLET | Freq: Four times a day (QID) | ORAL | Status: AC
  Administered 2014-11-30 – 2014-12-04 (×16): 1000 mg via ORAL
  Filled 2014-11-29 (×22): qty 2

## 2014-11-29 MED ORDER — PANTOPRAZOLE SODIUM 40 MG PO TBEC
40.0000 mg | DELAYED_RELEASE_TABLET | Freq: Every day | ORAL | Status: DC
  Administered 2014-12-01 – 2014-12-07 (×7): 40 mg via ORAL
  Filled 2014-11-29 (×8): qty 1

## 2014-11-29 MED ORDER — MAGNESIUM SULFATE 4 GM/100ML IV SOLN
4.0000 g | Freq: Once | INTRAVENOUS | Status: AC
  Administered 2014-11-29: 4 g via INTRAVENOUS
  Filled 2014-11-29: qty 100

## 2014-11-29 MED ORDER — ACETAMINOPHEN 160 MG/5ML PO SOLN: 1000.0000 mg | Freq: Four times a day (QID) | ORAL | Status: AC

## 2014-11-29 MED ORDER — LEVALBUTEROL HCL 0.63 MG/3ML IN NEBU
INHALATION_SOLUTION | RESPIRATORY_TRACT | Status: AC
  Administered 2014-11-29: 0.63 mg via RESPIRATORY_TRACT
  Filled 2014-11-29: qty 3

## 2014-11-29 MED ORDER — ALBUMIN HUMAN 5 % IV SOLN
250.0000 mL | INTRAVENOUS | Status: AC | PRN
  Administered 2014-11-29 – 2014-11-30 (×4): 250 mL via INTRAVENOUS
  Filled 2014-11-29 (×2): qty 250

## 2014-11-29 MED ORDER — DOPAMINE-DEXTROSE 3.2-5 MG/ML-% IV SOLN: 0.0000 ug/kg/min | INTRAVENOUS | Status: DC

## 2014-11-29 MED ORDER — FAMOTIDINE IN NACL 20-0.9 MG/50ML-% IV SOLN
20.0000 mg | Freq: Two times a day (BID) | INTRAVENOUS | Status: AC
  Administered 2014-11-29 (×2): 20 mg via INTRAVENOUS
  Filled 2014-11-29: qty 50

## 2014-11-29 MED ORDER — FENTANYL CITRATE (PF) 100 MCG/2ML IJ SOLN
INTRAMUSCULAR | Status: DC | PRN
  Administered 2014-11-29: 250 ug via INTRAVENOUS
  Administered 2014-11-29: 100 ug via INTRAVENOUS
  Administered 2014-11-29: 200 ug via INTRAVENOUS
  Administered 2014-11-29: 50 ug via INTRAVENOUS
  Administered 2014-11-29: 150 ug via INTRAVENOUS
  Administered 2014-11-29: 250 ug via INTRAVENOUS

## 2014-11-29 MED ORDER — PROPOFOL 10 MG/ML IV BOLUS
INTRAVENOUS | Status: AC
  Filled 2014-11-29: qty 20

## 2014-11-29 MED ORDER — PROPOFOL 10 MG/ML IV BOLUS
INTRAVENOUS | Status: DC | PRN
  Administered 2014-11-29: 100 mg via INTRAVENOUS

## 2014-11-29 MED ORDER — SODIUM CHLORIDE 0.45 % IV SOLN
INTRAVENOUS | Status: DC | PRN
  Administered 2014-11-29: 20 mL/h via INTRAVENOUS

## 2014-11-29 MED ORDER — SODIUM BICARBONATE 8.4 % IV SOLN
50.0000 meq | Freq: Once | INTRAVENOUS | Status: AC
  Administered 2014-11-29: 50 meq via INTRAVENOUS

## 2014-11-29 MED ORDER — LACTATED RINGERS IV SOLN
INTRAVENOUS | Status: DC
  Administered 2014-12-02: 05:00:00 via INTRAVENOUS

## 2014-11-29 MED ORDER — MIDAZOLAM HCL 2 MG/2ML IJ SOLN: 2.0000 mg | INTRAMUSCULAR | Status: DC | PRN

## 2014-11-29 MED ORDER — ARTIFICIAL TEARS OP OINT
TOPICAL_OINTMENT | OPHTHALMIC | Status: AC
  Filled 2014-11-29: qty 3.5

## 2014-11-29 MED ORDER — MIDAZOLAM HCL 5 MG/5ML IJ SOLN
INTRAMUSCULAR | Status: DC | PRN
  Administered 2014-11-29: 1 mg via INTRAVENOUS
  Administered 2014-11-29: 2 mg via INTRAVENOUS
  Administered 2014-11-29: 1 mg via INTRAVENOUS
  Administered 2014-11-29: 3 mg via INTRAVENOUS
  Administered 2014-11-29: 1 mg via INTRAVENOUS
  Administered 2014-11-29: 2 mg via INTRAVENOUS

## 2014-11-29 MED ORDER — CALCIUM CHLORIDE 10 % IV SOLN
INTRAVENOUS | Status: AC
  Filled 2014-11-29: qty 10

## 2014-11-29 MED ORDER — NITROGLYCERIN IN D5W 200-5 MCG/ML-% IV SOLN: 0.0000 ug/min | INTRAVENOUS | Status: DC

## 2014-11-29 MED ORDER — ALBUMIN HUMAN 5 % IV SOLN
INTRAVENOUS | Status: DC | PRN
  Administered 2014-11-29 (×2): via INTRAVENOUS

## 2014-11-29 MED ORDER — DEXMEDETOMIDINE HCL IN NACL 200 MCG/50ML IV SOLN
0.0000 ug/kg/h | INTRAVENOUS | Status: DC
  Administered 2014-11-29: 0.7 ug/kg/h via INTRAVENOUS
  Filled 2014-11-29: qty 50

## 2014-11-29 MED ORDER — ASPIRIN EC 325 MG PO TBEC
325.0000 mg | DELAYED_RELEASE_TABLET | Freq: Every day | ORAL | Status: DC
  Administered 2014-11-30 – 2014-12-07 (×7): 325 mg via ORAL
  Filled 2014-11-29 (×8): qty 1

## 2014-11-29 MED ORDER — ROCURONIUM BROMIDE 100 MG/10ML IV SOLN
INTRAVENOUS | Status: DC | PRN
  Administered 2014-11-29: 50 mg via INTRAVENOUS
  Administered 2014-11-29: 40 mg via INTRAVENOUS
  Administered 2014-11-29 (×2): 50 mg via INTRAVENOUS
  Administered 2014-11-29: 10 mg via INTRAVENOUS
  Administered 2014-11-29 (×3): 50 mg via INTRAVENOUS

## 2014-11-29 MED ORDER — MAGNESIUM SULFATE 50 % IJ SOLN
INTRAMUSCULAR | Status: DC | PRN
  Administered 2014-11-29: 2.5 g via INTRAVENOUS

## 2014-11-29 MED ORDER — OXYCODONE HCL 5 MG PO TABS
5.0000 mg | ORAL_TABLET | ORAL | Status: DC | PRN
  Administered 2014-12-01 (×2): 10 mg via ORAL
  Administered 2014-12-03 – 2014-12-07 (×7): 5 mg via ORAL
  Filled 2014-11-29: qty 2
  Filled 2014-11-29 (×2): qty 1
  Filled 2014-11-29: qty 2
  Filled 2014-11-29 (×5): qty 1

## 2014-11-29 MED ORDER — LACTATED RINGERS IV SOLN
INTRAVENOUS | Status: DC | PRN
  Administered 2014-11-29 (×2): via INTRAVENOUS

## 2014-11-29 MED ORDER — STERILE WATER FOR INJECTION IJ SOLN
INTRAMUSCULAR | Status: AC
  Filled 2014-11-29: qty 10

## 2014-11-29 MED ORDER — ONDANSETRON HCL 4 MG/2ML IJ SOLN
4.0000 mg | Freq: Four times a day (QID) | INTRAMUSCULAR | Status: DC | PRN
  Administered 2014-12-03 – 2014-12-06 (×5): 4 mg via INTRAVENOUS
  Filled 2014-11-29 (×5): qty 2

## 2014-11-29 MED ORDER — ASPIRIN 81 MG PO CHEW
324.0000 mg | CHEWABLE_TABLET | Freq: Every day | ORAL | Status: DC
  Administered 2014-12-04: 324 mg
  Filled 2014-11-29: qty 4

## 2014-11-29 MED ORDER — ACETAMINOPHEN 160 MG/5ML PO SOLN: 650.0000 mg | Freq: Once | ORAL | Status: AC

## 2014-11-29 MED ORDER — MIDAZOLAM HCL 10 MG/2ML IJ SOLN
INTRAMUSCULAR | Status: AC
  Filled 2014-11-29: qty 4

## 2014-11-29 MED ORDER — METOPROLOL TARTRATE 12.5 MG HALF TABLET
12.5000 mg | ORAL_TABLET | Freq: Two times a day (BID) | ORAL | Status: DC
  Filled 2014-11-29 (×5): qty 1

## 2014-11-29 MED ORDER — BISACODYL 10 MG RE SUPP: 10.0000 mg | Freq: Every day | RECTAL | Status: DC

## 2014-11-29 MED ORDER — SODIUM CHLORIDE 0.9 % IV SOLN: 250.0000 mL | INTRAVENOUS | Status: DC

## 2014-11-29 MED ORDER — METOPROLOL TARTRATE 1 MG/ML IV SOLN: 2.5000 mg | INTRAVENOUS | Status: DC | PRN

## 2014-11-29 MED ORDER — DOCUSATE SODIUM 100 MG PO CAPS
200.0000 mg | ORAL_CAPSULE | Freq: Every day | ORAL | Status: DC
  Administered 2014-11-30 – 2014-12-05 (×5): 200 mg via ORAL
  Filled 2014-11-29 (×5): qty 2

## 2014-11-29 MED ORDER — VANCOMYCIN HCL IN DEXTROSE 1-5 GM/200ML-% IV SOLN
1000.0000 mg | Freq: Once | INTRAVENOUS | Status: AC
  Administered 2014-11-29: 1000 mg via INTRAVENOUS
  Filled 2014-11-29: qty 200

## 2014-11-29 MED FILL — Lidocaine HCl IV Inj 20 MG/ML: INTRAVENOUS | Qty: 5 | Status: AC

## 2014-11-29 MED FILL — Electrolyte-R (PH 7.4) Solution: INTRAVENOUS | Qty: 5000 | Status: AC

## 2014-11-29 MED FILL — Mannitol IV Soln 20%: INTRAVENOUS | Qty: 500 | Status: AC

## 2014-11-29 MED FILL — Sodium Chloride IV Soln 0.9%: INTRAVENOUS | Qty: 2000 | Status: AC

## 2014-11-29 MED FILL — Sodium Bicarbonate IV Soln 8.4%: INTRAVENOUS | Qty: 50 | Status: AC

## 2014-11-29 MED FILL — Heparin Sodium (Porcine) Inj 1000 Unit/ML: INTRAMUSCULAR | Qty: 30 | Status: AC

## 2014-11-29 MED FILL — Heparin Sodium (Porcine) Inj 1000 Unit/ML: INTRAMUSCULAR | Qty: 10 | Status: AC

## 2014-11-29 SURGICAL SUPPLY — 97 items
BAG DECANTER FOR FLEXI CONT (MISCELLANEOUS) ×3 IMPLANT
BANDAGE ELASTIC 4 VELCRO ST LF (GAUZE/BANDAGES/DRESSINGS) ×3 IMPLANT
BANDAGE ELASTIC 6 VELCRO ST LF (GAUZE/BANDAGES/DRESSINGS) ×3 IMPLANT
BASKET HEART (ORDER IN 25'S) (MISCELLANEOUS) ×1
BASKET HEART (ORDER IN 25S) (MISCELLANEOUS) ×2 IMPLANT
BENZOIN TINCTURE PRP APPL 2/3 (GAUZE/BANDAGES/DRESSINGS) ×3 IMPLANT
BLADE STERNUM SYSTEM 6 (BLADE) ×3 IMPLANT
BLADE SURG 11 STRL SS (BLADE) ×3 IMPLANT
BNDG GAUZE ELAST 4 BULKY (GAUZE/BANDAGES/DRESSINGS) ×3 IMPLANT
CANISTER SUCTION 2500CC (MISCELLANEOUS) ×3 IMPLANT
CANNULA EZ GLIDE AORTIC 21FR (CANNULA) ×3 IMPLANT
CATH CPB KIT HENDRICKSON (MISCELLANEOUS) ×3 IMPLANT
CATH ROBINSON RED A/P 18FR (CATHETERS) ×3 IMPLANT
CATH THORACIC 28FR (CATHETERS) ×3 IMPLANT
CATH THORACIC 36FR (CATHETERS) ×3 IMPLANT
CATH THORACIC 36FR RT ANG (CATHETERS) ×6 IMPLANT
CLIP FOGARTY SPRING 6M (CLIP) ×3 IMPLANT
CLIP TI MEDIUM 24 (CLIP) IMPLANT
CLIP TI WIDE RED SMALL 24 (CLIP) ×6 IMPLANT
CRADLE DONUT ADULT HEAD (MISCELLANEOUS) ×3 IMPLANT
DRAPE CARDIOVASCULAR INCISE (DRAPES) ×1
DRAPE SLUSH/WARMER DISC (DRAPES) ×3 IMPLANT
DRAPE SRG 135X102X78XABS (DRAPES) ×2 IMPLANT
DRSG AQUACEL AG ADV 3.5X14 (GAUZE/BANDAGES/DRESSINGS) ×3 IMPLANT
DRSG COVADERM 4X14 (GAUZE/BANDAGES/DRESSINGS) ×3 IMPLANT
ELECT REM PT RETURN 9FT ADLT (ELECTROSURGICAL) ×6
ELECTRODE REM PT RTRN 9FT ADLT (ELECTROSURGICAL) ×4 IMPLANT
GAUZE SPONGE 4X4 12PLY STRL (GAUZE/BANDAGES/DRESSINGS) ×6 IMPLANT
GLOVE SURG SIGNA 7.5 PF LTX (GLOVE) ×12 IMPLANT
GOWN STRL REUS W/ TWL LRG LVL3 (GOWN DISPOSABLE) ×8 IMPLANT
GOWN STRL REUS W/ TWL XL LVL3 (GOWN DISPOSABLE) ×4 IMPLANT
GOWN STRL REUS W/TWL LRG LVL3 (GOWN DISPOSABLE) ×4
GOWN STRL REUS W/TWL XL LVL3 (GOWN DISPOSABLE) ×2
HEMOSTAT POWDER SURGIFOAM 1G (HEMOSTASIS) ×9 IMPLANT
HEMOSTAT SURGICEL 2X14 (HEMOSTASIS) ×3 IMPLANT
INSERT FOGARTY XLG (MISCELLANEOUS) IMPLANT
KIT BASIN OR (CUSTOM PROCEDURE TRAY) ×3 IMPLANT
KIT ROOM TURNOVER OR (KITS) ×3 IMPLANT
KIT SUCTION CATH 14FR (SUCTIONS) ×6 IMPLANT
KIT VASOVIEW W/TROCAR VH 2000 (KITS) ×3 IMPLANT
MARKER GRAFT CORONARY BYPASS (MISCELLANEOUS) ×9 IMPLANT
NS IRRIG 1000ML POUR BTL (IV SOLUTION) ×15 IMPLANT
PACK OPEN HEART (CUSTOM PROCEDURE TRAY) ×3 IMPLANT
PAD ARMBOARD 7.5X6 YLW CONV (MISCELLANEOUS) ×6 IMPLANT
PAD ELECT DEFIB RADIOL ZOLL (MISCELLANEOUS) ×3 IMPLANT
PENCIL BUTTON HOLSTER BLD 10FT (ELECTRODE) ×6 IMPLANT
PUNCH AORTIC ROTATE 4.0MM (MISCELLANEOUS) IMPLANT
PUNCH AORTIC ROTATE 4.5MM 8IN (MISCELLANEOUS) IMPLANT
PUNCH AORTIC ROTATE 5MM 8IN (MISCELLANEOUS) IMPLANT
SET CARDIOPLEGIA MPS 5001102 (MISCELLANEOUS) ×3 IMPLANT
SPONGE GAUZE 4X4 12PLY STER LF (GAUZE/BANDAGES/DRESSINGS) ×6 IMPLANT
STRIP CLOSURE SKIN 1/2X4 (GAUZE/BANDAGES/DRESSINGS) ×3 IMPLANT
SUT BONE WAX W31G (SUTURE) ×3 IMPLANT
SUT ETHIBOND 2 0 SH (SUTURE) ×4
SUT ETHIBOND 2 0 SH 36X2 (SUTURE) ×8 IMPLANT
SUT MNCRL AB 4-0 PS2 18 (SUTURE) ×6 IMPLANT
SUT PROLENE 3 0 SH DA (SUTURE) ×3 IMPLANT
SUT PROLENE 4 0 RB 1 (SUTURE) ×2
SUT PROLENE 4 0 SH DA (SUTURE) IMPLANT
SUT PROLENE 4-0 RB1 .5 CRCL 36 (SUTURE) ×4 IMPLANT
SUT PROLENE 5 0 C 1 36 (SUTURE) ×9 IMPLANT
SUT PROLENE 6 0 C 1 30 (SUTURE) ×21 IMPLANT
SUT PROLENE 7 0 BV 1 (SUTURE) ×3 IMPLANT
SUT PROLENE 7 0 BV1 MDA (SUTURE) ×9 IMPLANT
SUT PROLENE 8 0 BV175 6 (SUTURE) ×9 IMPLANT
SUT SILK  1 MH (SUTURE) ×3
SUT SILK 1 MH (SUTURE) ×6 IMPLANT
SUT SILK 1 TIES 10X30 (SUTURE) ×3 IMPLANT
SUT SILK 2 0 SH CR/8 (SUTURE) ×6 IMPLANT
SUT SILK 2 0 TIES 10X30 (SUTURE) ×3 IMPLANT
SUT SILK 2 0 TIES 17X18 (SUTURE) ×1
SUT SILK 2-0 18XBRD TIE BLK (SUTURE) ×2 IMPLANT
SUT SILK 3 0 SH CR/8 (SUTURE) ×3 IMPLANT
SUT SILK 4 0 TIE 10X30 (SUTURE) ×6 IMPLANT
SUT STEEL 6MS V (SUTURE) ×6 IMPLANT
SUT STEEL STERNAL CCS#1 18IN (SUTURE) IMPLANT
SUT STEEL SZ 6 DBL 3X14 BALL (SUTURE) ×3 IMPLANT
SUT TEM PAC WIRE 2 0 SH (SUTURE) ×12 IMPLANT
SUT VIC AB 1 CTX 36 (SUTURE) ×5
SUT VIC AB 1 CTX36XBRD ANBCTR (SUTURE) ×10 IMPLANT
SUT VIC AB 2-0 CT1 27 (SUTURE) ×2
SUT VIC AB 2-0 CT1 TAPERPNT 27 (SUTURE) ×4 IMPLANT
SUT VIC AB 2-0 CTX 27 (SUTURE) ×6 IMPLANT
SUT VIC AB 3-0 SH 27 (SUTURE)
SUT VIC AB 3-0 SH 27X BRD (SUTURE) IMPLANT
SUT VIC AB 3-0 X1 27 (SUTURE) ×6 IMPLANT
SUT VICRYL 4-0 PS2 18IN ABS (SUTURE) IMPLANT
SUTURE E-PAK OPEN HEART (SUTURE) ×3 IMPLANT
SYSTEM SAHARA CHEST DRAIN ATS (WOUND CARE) ×3 IMPLANT
TAPE CLOTH SURG 4X10 WHT LF (GAUZE/BANDAGES/DRESSINGS) ×6 IMPLANT
TOWEL OR 17X24 6PK STRL BLUE (TOWEL DISPOSABLE) ×6 IMPLANT
TOWEL OR 17X26 10 PK STRL BLUE (TOWEL DISPOSABLE) ×6 IMPLANT
TRAY FOLEY IC TEMP SENS 16FR (CATHETERS) ×3 IMPLANT
TUBE FEEDING 8FR 16IN STR KANG (MISCELLANEOUS) ×3 IMPLANT
TUBING INSUFFLATION (TUBING) ×3 IMPLANT
UNDERPAD 30X30 INCONTINENT (UNDERPADS AND DIAPERS) ×3 IMPLANT
WATER STERILE IRR 1000ML POUR (IV SOLUTION) ×6 IMPLANT

## 2014-11-29 NOTE — Anesthesia Procedure Notes (Addendum)
Anesthesia Procedure Note Risks, benefits, and alternatives discussed for central line placement. Patient agrees to procedure.  Patient positioned supine with mild trendelenburg.  Sterile prep and drape of right neck.  Full barrier precautions taken.  1% injectable lidocaine placed SQ for topical anesthesia.  Central line placed with ultrasound guidance.  Appropriate blood return from catheter.  No air.  Sutured into place.  Sterile dressing applied.  No complications. Patient tolerated the procedure well.  9 MAC Cordis placed due to poor IV access Procedure Name: Intubation Date/Time: 11/29/2014 8:47 AM Performed by: Shirlyn Goltz Pre-anesthesia Checklist: Patient identified, Emergency Drugs available, Patient being monitored and Suction available Patient Re-evaluated:Patient Re-evaluated prior to inductionOxygen Delivery Method: Circle system utilized Preoxygenation: Pre-oxygenation with 100% oxygen Intubation Type: IV induction Ventilation: Mask ventilation without difficulty and Oral airway inserted - appropriate to patient size Laryngoscope Size: Mac and 3 Grade View: Grade I Tube type: Oral Tube size: 7.5 mm Number of attempts: 1 Airway Equipment and Method: Stylet Placement Confirmation: positive ETCO2,  breath sounds checked- equal and bilateral and ETT inserted through vocal cords under direct vision Secured at: 22 cm Tube secured with: Tape Dental Injury: Teeth and Oropharynx as per pre-operative assessment

## 2014-11-29 NOTE — Progress Notes (Signed)
  Echocardiogram Echocardiogram Transesophageal has been performed.  Jennette Dubin 11/29/2014, 10:23 AM

## 2014-11-29 NOTE — Progress Notes (Signed)
Patient has been clipped and bathed with Hibiclens for second time. Preprocedure list completed.

## 2014-11-29 NOTE — Brief Op Note (Addendum)
11/23/2014 - 11/29/2014  1:15 PM  PATIENT:  [name]  69 y.o. female  PRE-OPERATIVE DIAGNOSIS:  3 VESSEL CAD  POST-OPERATIVE DIAGNOSIS:  3 VESSEL CAD  PROCEDURE:   CORONARY ARTERY BYPASS GRAFTING x 5   LIMA to LAD  SVG to Diagonal 1  Sequential SVG to OM1-OM2  SVG-PDA ENDOSCOPIC GREATER SAPHENOUS VEIN HARVEST RIGHT LEG AND LEFT THIGH  SURGEON:  Melrose Nakayama, MD  ASSISTANT: Suzzanne Cloud, PA-C  ANESTHESIA:   general  PATIENT CONDITION:  ICU - intubated and hemodynamically stable.  PRE-OPERATIVE WEIGHT: 110 kg  XC= 87 min CPB= 143 min  FINDINGS:  1. Severe sternal osteoporosis 2. RV pacer lead through anterior wall of RV into pericardium 3. Good conduits 4. Good targets 5. LVH, diastolic dysfunction

## 2014-11-29 NOTE — OR Nursing (Signed)
SICU notified of 1 hr call

## 2014-11-29 NOTE — Interval H&P Note (Signed)
History and Physical Interval Note:  11/29/2014 8:18 AM  [name]  has presented today for surgery, with the diagnosis of CAD  The various methods of treatment have been discussed with the patient and family. After consideration of risks, benefits and other options for treatment, the patient has consented to  Procedure(s): CORONARY ARTERY BYPASS GRAFTING (CABG) (N/A) TRANSESOPHAGEAL ECHOCARDIOGRAM (TEE) (N/A) as a surgical intervention .  The patient's history has been reviewed, patient examined, no change in status, stable for surgery.  I have reviewed the patient's chart and labs.  Questions were answered to the patient's satisfaction.     [name]

## 2014-11-29 NOTE — Anesthesia Preprocedure Evaluation (Addendum)
Anesthesia Evaluation  Patient identified by MRN, date of birth, ID band Patient awake    Reviewed: Allergy & Precautions, H&P , NPO status , Patient's Chart, lab work & pertinent test results  History of Anesthesia Complications Negative for: history of anesthetic complications  Airway Mallampati: IV  TM Distance: >3 FB Neck ROM: full    Dental  (+) Edentulous Upper, Edentulous Lower   Pulmonary neg pulmonary ROS, asthma , sleep apnea , former smoker,    Pulmonary exam normal breath sounds clear to auscultation       Cardiovascular hypertension, + angina + CAD and +CHF  negative cardio ROS Normal cardiovascular exam+ pacemaker (pt is biventricular paced 100% of the time, indication sinoatrial node dysfunction)  Rhythm:regular Rate:Normal     Neuro/Psych negative neurological ROS     GI/Hepatic negative GI ROS, Neg liver ROS, hiatal hernia, GERD  ,  Endo/Other  diabetesMorbid obesity  Renal/GU negative Renal ROS     Musculoskeletal   Abdominal (+) + obese,   Peds  Hematology   Anesthesia Other Findings Patients heparin has been stopped  Reproductive/Obstetrics negative OB ROS                           Anesthesia Physical Anesthesia Plan  ASA: IV  Anesthesia Plan: General   Post-op Pain Management:    Induction: Intravenous  Airway Management Planned: Oral ETT  Additional Equipment: CVP, Arterial line, TEE, PA Cath and Ultrasound Guidance Line Placement  Intra-op Plan:   Post-operative Plan: Post-operative intubation/ventilation  Informed Consent: I have reviewed the patients History and Physical, chart, labs and discussed the procedure including the risks, benefits and alternatives for the proposed anesthesia with the patient or authorized representative who has indicated his/her understanding and acceptance.   Dental Advisory Given  Plan Discussed with: Anesthesiologist,  CRNA and Surgeon  Anesthesia Plan Comments:         Anesthesia Quick Evaluation

## 2014-11-29 NOTE — Op Note (Signed)
NAMEMarland [name]  Alicia, [name]             ACCOUNT NO.:  [id number]  MEDICAL RECORD NO.:  [id number]  LOCATION:  2S11C                        FACILITY:  [facility]  PHYSICIAN:  Revonda Standard. Roxan Hockey, M.D.DATE OF BIRTH:  Nov 14, 1945  DATE OF PROCEDURE:  11/29/2014 DATE OF DISCHARGE:                              OPERATIVE REPORT   PREOPERATIVE DIAGNOSIS:  Three-vessel coronary artery disease with unstable angina.  POSTOPERATIVE DIAGNOSIS:  Three-vessel coronary artery disease with unstable angina.  PROCEDURE:   Median sternotomy, extracorporeal circulation Coronary artery bypass grafting x 5  Left internal mammary artery to left anterior descending  Saphenous vein graft to first diagonal  Sequential saphenous vein graft to obtuse marginals 1 and 2  Saphenous vein graft to posterior descending, Endoscopic vein harvest right leg and left thigh.  SURGEON:  Revonda Standard. Roxan Hockey, M.D.  ASSISTANT:  Suzzanne Cloud, PA-C.  ANESTHESIA:  General.  FINDINGS:  Transesophageal echocardiography showed left ventricular hypertrophy with diastolic dysfunction.  Preserved systolic function. No valvular pathology.  Severe sternal osteoporosis.  Saphenous vein from thighs was good quality.  Saphenous vein below the knee was small and unusable.  Right ventricular pacemaker lead through the free wall of the right atrium into the pericardium- could not be repositioned into the heart.  Left mammary good quality. Good quality targets.  CLINICAL NOTE:  [name] is a 69 year old woman with a long history of diastolic heart failure with a biventricular pacemaker.  She also has a history of nonobstructive coronary artery disease, obesity, hypertension, and sleep apnea, among other medical problems.  She presented on November 23, 2014, after being awakened from her sleep with anginal pain.  She ruled out for MI.  At cardiac catheterization, she was found to have 3-vessel disease, but the LAD stenosis only appeared  moderate. FFR demonstrated significant flow reduction.  The patient was advised to undergo coronary artery bypass grafting.  The indications, risks, benefits, and alternatives were discussed in detail with the patient. She understood and accepted the risks and agreed to proceed.  OPERATIVE NOTE:  [name] was brought to the preoperative holding area on November 29, 2014.  Anesthesia placed a Swan-Ganz catheter and an arterial blood pressure monitoring line.  She was taken to the operating room, anesthetized, and intubated.  Intravenous antibiotics were administered.  Transesophageal echocardiography was performed.  Findings as noted.  A Foley catheter was placed.  The chest, abdomen, and legs were prepped and draped in the usual sterile fashion.  A median sternotomy was performed.  The patient was morbidly obese.  The sternum was osteoporotic.  An incision was made in the medial aspect of the right leg at the level of the knee.  The greater saphenous vein was identified and harvested endoscopically from groin to mid calf.  The left internal mammary artery was harvested under direct vision.  2000 units of heparin was administered during the vessel harvest.  After removing the vein from the right leg, it was clear that the portion of the vein from below the knee had multiple branchings and unsuitable for use as a graft due to small size.  The vein from above the knee was excellent quality.  It was not of adequate length  to perform all the bypasses; therefore, the saphenous vein was harvested from the left thigh endoscopically as well.  It turned out to be a good quality vein also.  After harvesting the conduits, the remainder of the full heparin dose was given.  The sternal retractor was placed and gently opened over time.  The pericardium was opened.  There was a small pericardial effusion.  After confirming adequate anticoagulation with ACT measurement, the aorta was cannulated via  concentric 2-0 Ethibond pledgeted pursestring sutures.  The aorta was generous in size, but did not appear aneurysmal.  A dual stage venous cannula was placed via a pursestring suture in the right atrium. Cardiopulmonary bypass was initiated and flow was maintained per protocol.  The patient was cooled to 32 degrees Celsius.  The coronary arteries were inspected.  When attempting to lift the heart to inspect the lateral vessels, there was what felt like an adhesion in the pericardial space.  Inspection revealed that this was the right ventricular pacemaker lead, which was screwed into the pericardium.  There was no bleeding around this site. An attempt was made to reposition the lead back into the right ventricle; however, the lead would not go into the right ventricle. Therefore, it was cut off at the level of the right ventricle free wall, a 4-0 Prolene pledgeted suture was placed to close the defect.  The coronary arteries were inspected and anastomotic sites were chosen.  The conduits were inspected and cut to length.  A foam pad was placed in the pericardium to insulate the heart.  A temperature probe was placed in myocardial septum and a cardioplegia cannula was placed in the ascending aorta.  The aorta was crossclamped.  The left ventricle was emptied via the aortic root vent.  Cardiac arrest then was achieved with a combination of cold antegrade blood cardioplegia and topical iced saline. 1.5 L of cardioplegia was administered.  There was a rapid diastolic arrest and septal cooling to 9 degrees Celsius.  A reversed saphenous vein graft was placed end-to-side to the posterior descending branch of the right coronary.  This was a 1.5 mm target.  It was of good quality at the site of the anastomosis.  The vein was of good quality.  An end-to-side anastomosis was performed with a running 7- 0 Prolene suture.  All anastomoses were probed proximally and distally to ensure patency prior  to tying the sutures.  Cardioplegia was administered via each vein graft at the completion of its anastamoses.  In each case, there was good flow and good hemostasis.  A reversed saphenous vein graft was placed end-to-side to the first diagonal branch to the LAD.  This was a 1.5 mm good quality target. Again, additional cardioplegia was administered.  A reversed saphenous vein graft was placed sequentially to obtuse marginals of 1 and 2.  These were both 1.5 mm target vessels of good quality.  A side- to-side anastomosis was performed to OM1 and end-to-side to OM2.  Both were done with running 7-0 Prolene sutures. There was excellent flow through the graft and good hemostasis at both anastomoses.  Additional cardioplegia then was administered down the aortic root.  The left internal mammary artery was brought through a window in the pericardium.  The distal end was beveled.  It was anastomosed end-to- side to the distal LAD.  The LAD was a 1.5 mm good quality target at the site of the anastomosis.  The mammary was a 1.5 mm good quality conduit with  excellent flow.  The anastomosis was performed with a running 8-0 Prolene suture.  At the completion of the mammary to LAD anastomosis, the bulldog clamp was briefly removed.  Rapid septal rewarming was noted.  The bulldog clamp was replaced.  The mammary pedicle was tacked to the epicardial surface of the heart with 6-0 Prolene sutures.  Additional cardioplegia was administered down the veins.  The vein grafts then were cut to length.  The cardioplegia cannula was removed from the ascending aorta.  The proximal vein graft anastomoses were performed to 4.5 mm punch aortotomies with running 6-0 Prolene sutures. At the completion of final proximal anastomosis, the patient was placed in Trendelenburg position.  Lidocaine was administered.  The aortic root was de-aired.  The bulldog clamp was removed from the left mammary artery.  The aortic  crossclamp was removed.  Total cross-clamp time was 87 minutes.  The patient required a single defibrillation with 10 joules and then was in a paced rhythm thereafter.  While rewarming was completed, all proximal and distal anastomoses were inspected for hemostasis.  Epicardial pacing wires were placed on the right ventricle and right atrium for backup in case there was a malfunction of the internal pacemaker. The internal pacemaker functioned normally, so the external pacemaker was not needed.  A dopamine infusion was started at 3 mcg/kg per minute.  The patient was weaned from cardiopulmonary bypass without difficulty on the first attempt.  Total bypass time was 143 minutes.  The initial cardiac index was greater than 2 L/minute/m2.  A test dose of protamine was administered and was well tolerated.  The atrial and aortic cannulae were removed.  Postbypass transesophageal echocardiography revealed good systolic function.  Hemostasis was achieved.  The pericardium was not closed.  Left pleural and mediastinal chest tubes were placed through separate subcostal incisions and secured with #1 silk sutures.  The sternum was closed with heavy gauge double stainless steel wires.  During sternal closure care was taken not to overtighten the wires due to the osteoporosis.  The pectoralis fascia, subcutaneous tissue, and skin were closed in standard fashion.  The leg incisions were closed in standard fashion.  The patient was taken from the operating room to the Surgical Intensive Care Unit, intubated and in good condition.     Revonda Standard Roxan Hockey, M.D.     SCH/MEDQ  D:  11/29/2014  T:  11/29/2014  Job:  [id number]

## 2014-11-29 NOTE — Progress Notes (Signed)
[place] for Heparin  Indication: CAD  Allergies  Allergen Reactions  . Ivp Dye [Iodinated Diagnostic Agents] Shortness Of Breath    No reaction to PO contrast with non-ionic dye.06-25-2014/rsm  . Shellfish-Derived Products Anaphylaxis  . Sulfa Antibiotics Shortness Of Breath  . Iodine Hives  . Zithromax [Azithromycin] Rash    Patient Measurements: Height: 5\' 5"  (165.1 cm) Weight: 242 lb 1.6 oz (109.816 kg) IBW/kg (Calculated) : 57  Heparin Dosing Weight: 85 kg  Vital Signs: Temp: 98 F (36.7 C) (10/18 2146) Temp Source: Oral (10/18 2146) BP: 105/61 mmHg (10/18 2146) Pulse Rate: 68 (10/18 2146)  Labs:  Recent Labs  11/26/14 0535 11/27/14 0256 11/27/14 1220 11/28/14 0322 11/28/14 1142 11/28/14 1638 11/28/14 2220  HGB 14.9 13.9  --  14.2  --   --   --   HCT 45.7 43.7  --  44.2  --   --   --   PLT 180 196  --  194  --   --   --   APTT  --   --   --   --   --  51*  --   LABPROT  --   --   --   --   --  13.3  --   INR  --   --   --   --   --  0.99  --   HEPARINUNFRC 0.54 0.30  --  0.19* 0.30  --  0.57  CREATININE  --   --  1.45* 1.58*  --   --   --     Estimated Creatinine Clearance: 40.8 mL/min (by C-G formula based on Cr of 1.58).   Assessment: 69 y/o female with CAD awaiting CABG for heparin Goal of Therapy:  Heparin level 0.3-0.7 units/ml Monitor platelets by anticoagulation protocol: Yes   Plan:  Continue Heparin at current rate  F/U after CABG tomorrow  Phillis Knack, PharmD, BCPS

## 2014-11-29 NOTE — Transfer of Care (Signed)
Immediate Anesthesia Transfer of Care Note  Patient: [name]  Procedure(s) Performed: Procedure(s): CORONARY ARTERY BYPASS GRAFTING x 5 (LIMA-LAD, SVG-D, SVG-OM1-OM2, SVG-PD) (N/A) TRANSESOPHAGEAL ECHOCARDIOGRAM (TEE) (N/A)  Patient Location: SICU  Anesthesia Type:General  Level of Consciousness: sedated and unresponsive  Airway & Oxygen Therapy: Patient remains intubated per anesthesia plan and Patient placed on Ventilator (see vital sign flow sheet for setting)  Post-op Assessment: Report given to RN and Post -op Vital signs reviewed and stable  Post vital signs: Reviewed and stable  Last Vitals:  Filed Vitals:   11/29/14 1536  BP: 99/59  Pulse: 103  Temp:   Resp: 12    Complications: No apparent anesthesia complications

## 2014-11-29 NOTE — Plan of Care (Signed)
Problem: Phase I Progression Outcomes Goal: EF % per last Echo/documented,Core Reminder form on chart Outcome: Completed/Met Date Met:  11/29/14 Performed on 11/24/2014 EF result - 55-60%

## 2014-11-29 NOTE — Progress Notes (Signed)
Notified CMT of patient off telemetry for going to OR for CABG. Received order from on-call attending doctor for patient to transport without telemetry. Report given to OR nurse. Nurse signing off at this time.

## 2014-11-29 NOTE — Progress Notes (Signed)
      [address]       [place] [address]             [phone]       Intubated, sedated  BP 98/49 mmHg  Pulse 89  Temp(Src) 97.7 F (36.5 C) (Core (Comment))  Resp 0  Ht 5\' 5"  (1.651 m)  Wt 242 lb 10.2 oz (110.061 kg)  BMI 40.38 kg/m2  SpO2 100%  CI= 2.0   Intake/Output Summary (Last 24 hours) at 11/29/14 1832 Last data filed at 11/29/14 1800  Gross per 24 hour  Intake 6342.31 ml  Output   5544 ml  Net 798.31 ml    Minimal CT output  Hypercarbia improved  Hct= 35  Alicia Salvato C. Roxan Hockey, MD [facility] [phone]

## 2014-11-29 NOTE — OR Nursing (Signed)
SICU notified of 45 min call 

## 2014-11-29 NOTE — Anesthesia Postprocedure Evaluation (Signed)
  Anesthesia Post-op Note  Patient: [name]  Procedure(s) Performed: Procedure(s) (LRB): CORONARY ARTERY BYPASS GRAFTING x 5 (LIMA-LAD, SVG-D, SVG-OM1-OM2, SVG-PD) (N/A) TRANSESOPHAGEAL ECHOCARDIOGRAM (TEE) (N/A)  Patient Location: Surgical ICU  Anesthesia Type: General  Level of Consciousness: sedated  Airway and Oxygen Therapy: on ventilator  Post-op Pain: none  Post-op Assessment: Post-op Vital signs reviewed, Patient's Cardiovascular Status Stable, Respiratory Function Stable, Patent Airway and No signs of Nausea or vomiting  Last Vitals:  Filed Vitals:   11/29/14 1536  BP: 99/59  Pulse: 103  Temp:   Resp: 12    Post-op Vital Signs: stable   Complications: No apparent anesthesia complications On dopamine@3 , phenylephrine@40mcgmin , receiving 3rd albumin currently.. We replaced calcium and magnesium 2.5gm post bypass Left patient in ICU with HR 102, she is paced at baseline for sick sinus node dysfunction with pacer set at 65, elevated HR is due to dopamine most likely, she does have temp pacing wires which are not on currently, BP is 100/65, PA pressures are baseline at 30/20, PA/systemic BP ration is 1/3 which is baseline.. She is morbidly obese with sleep apnea and will likely be a challenge to wean from the ventilator tonight, however last ABG was promising with pH 7.4, EtCO2 of 40, base excess of 0.  K was 4.7 and she has great UOP.

## 2014-11-29 NOTE — H&P (View-Only) (Signed)
Day of Surgery Procedure(s): Intravascular Pressure Wire/FFR Study Subjective: No complaints this evening She is less anxious about surgery.  Objective: Vital signs in last 24 hours: Temp:  [97.7 F (36.5 C)-98.6 F (37 C)] 98.6 F (37 C) (10/17 1541) Pulse Rate:  [0-86] 86 (10/17 1754) Cardiac Rhythm:  [-] Ventricular paced (10/17 0900) Resp:  [0-117] 18 (10/17 1754) BP: (95-133)/(49-80) 116/72 mmHg (10/17 1754) SpO2:  [0 %-100 %] 98 % (10/17 1754) Weight:  [242 lb 5.4 oz (109.925 kg)] 242 lb 5.4 oz (109.925 kg) (10/17 0551)  Hemodynamic parameters for last 24 hours:    Intake/Output from previous day: 10/16 0701 - 10/17 0700 In: 3119.5 [P.O.:1440; I.V.:1679.5] Out: 2600 [Urine:2600] Intake/Output this shift: Total I/O In: 373 [P.O.:144; I.V.:229] Out: 1050 [Urine:1050]  General appearance: alert, cooperative and no distress Neurologic: intact Heart: regular rate and rhythm Lungs: diminished breath sounds bibasilar  Lab Results:  Recent Labs  11/26/14 0535 11/27/14 0256  WBC 14.4* 10.8*  HGB 14.9 13.9  HCT 45.7 43.7  PLT 180 196   BMET:  Recent Labs  11/27/14 1220  NA 138  K 4.6  CL 101  CO2 26  GLUCOSE 161*  BUN 35*  CREATININE 1.45*  CALCIUM 9.1    PT/INR: No results for input(s): LABPROT, INR in the last 72 hours. ABG    Component Value Date/Time   PHART 7.461* 04/26/2007 1235   HCO3 28.8* 04/26/2007 1236   TCO2 28 05/05/2011 1703   O2SAT 65.0 04/26/2007 1236   CBG (last 3)   Recent Labs  11/25/14 1614 11/25/14 2123 11/26/14 0610  GLUCAP 215* 130* 97    Assessment/Plan: S/P Procedure(s): Intravascular Pressure Wire/FFR Study -  Cath today- LAD FFR = 0.76 c/w significant stenosis CABG is best option I discussed the general nature of the procedure, the need for general anesthesia, the use of cardiopulmonary bypass, and the incisions to be used with Mrs. Crumbliss. I reviewed the expected hospital stay, overall recovery and short and  long term outcomes. She does not remember our previous conversation re: risks. I again reviewed the indications, risks, benefits and alternatives. She understands the risks include, but are not limited to death, stroke, MI, DVT/PE, bleeding, possible need for transfusion, infections, cardiac arrhythmias, dislodgement of pacer leads, and other organ system dysfunction including respiratory, renal, or GI complications. She accepts the risks and agrees to proceed. OR Wednesday   LOS: 4 days    Melrose Nakayama 11/27/2014

## 2014-11-30 ENCOUNTER — Inpatient Hospital Stay (HOSPITAL_COMMUNITY): Payer: Medicare Other

## 2014-11-30 ENCOUNTER — Encounter (HOSPITAL_COMMUNITY): Payer: Self-pay | Admitting: Thoracic Surgery (Cardiothoracic Vascular Surgery)

## 2014-11-30 ENCOUNTER — Ambulatory Visit: Payer: Medicare Other | Admitting: Pulmonary Disease

## 2014-11-30 DIAGNOSIS — T82190A Other mechanical complication of cardiac electrode, initial encounter: Secondary | ICD-10-CM

## 2014-11-30 LAB — POCT I-STAT 3, ART BLOOD GAS (G3+)
Acid-Base Excess: 3 mmol/L — ABNORMAL HIGH (ref 0.0–2.0)
Acid-Base Excess: 2 mmol/L (ref 0.0–2.0)
Acid-Base Excess: 1 mmol/L (ref 0.0–2.0)
Acid-Base Excess: 3 mmol/L — ABNORMAL HIGH (ref 0.0–2.0)
Bicarbonate: 26.9 mEq/L — ABNORMAL HIGH (ref 20.0–24.0)
Bicarbonate: 27.4 mEq/L — ABNORMAL HIGH (ref 20.0–24.0)
Bicarbonate: 26.7 mEq/L — ABNORMAL HIGH (ref 20.0–24.0)
Bicarbonate: 27.9 mEq/L — ABNORMAL HIGH (ref 20.0–24.0)
O2 Saturation: 95 %
O2 Saturation: 85 %
O2 Saturation: 89 %
O2 Saturation: 93 %
Patient temperature: 37.1
Patient temperature: 37
Patient temperature: 37
Patient temperature: 36.8
TCO2: 28 mmol/L (ref 0–100)
TCO2: 29 mmol/L (ref 0–100)
TCO2: 28 mmol/L (ref 0–100)
TCO2: 29 mmol/L (ref 0–100)
pCO2 arterial: 37.2 mmHg (ref 35.0–45.0)
pCO2 arterial: 46.7 mmHg — ABNORMAL HIGH (ref 35.0–45.0)
pCO2 arterial: 45.3 mmHg — ABNORMAL HIGH (ref 35.0–45.0)
pCO2 arterial: 45.5 mmHg — ABNORMAL HIGH (ref 35.0–45.0)
pH, Arterial: 7.469 — ABNORMAL HIGH (ref 7.350–7.450)
pH, Arterial: 7.376 (ref 7.350–7.450)
pH, Arterial: 7.378 (ref 7.350–7.450)
pH, Arterial: 7.394 (ref 7.350–7.450)
pO2, Arterial: 72 mmHg — ABNORMAL LOW (ref 80.0–100.0)
pO2, Arterial: 52 mmHg — ABNORMAL LOW (ref 80.0–100.0)
pO2, Arterial: 57 mmHg — ABNORMAL LOW (ref 80.0–100.0)
pO2, Arterial: 66 mmHg — ABNORMAL LOW (ref 80.0–100.0)

## 2014-11-30 LAB — BASIC METABOLIC PANEL
Anion gap: 7 (ref 5–15)
BUN: 27 mg/dL — ABNORMAL HIGH (ref 6–20)
CO2: 25 mmol/L (ref 22–32)
Calcium: 9.6 mg/dL (ref 8.9–10.3)
Chloride: 106 mmol/L (ref 101–111)
Creatinine, Ser: 1.32 mg/dL — ABNORMAL HIGH (ref 0.44–1.00)
GFR calc Af Amer: 46 mL/min — ABNORMAL LOW (ref >60)
GFR calc non Af Amer: 40 mL/min — ABNORMAL LOW (ref >60)
Glucose, Bld: 126 mg/dL — ABNORMAL HIGH (ref 65–99)
Potassium: 4.1 mmol/L (ref 3.5–5.1)
Sodium: 138 mmol/L (ref 135–145)

## 2014-11-30 LAB — POCT I-STAT, CHEM 8
BUN: 37 mg/dL — ABNORMAL HIGH (ref 6–20)
Calcium, Ion: 1.25 mmol/L (ref 1.13–1.30)
Chloride: 101 mmol/L (ref 101–111)
Creatinine, Ser: 1.5 mg/dL — ABNORMAL HIGH (ref 0.44–1.00)
Glucose, Bld: 172 mg/dL — ABNORMAL HIGH (ref 65–99)
HCT: 29 % — ABNORMAL LOW (ref 36.0–46.0)
Hemoglobin: 9.9 g/dL — ABNORMAL LOW (ref 12.0–15.0)
Potassium: 4.1 mmol/L (ref 3.5–5.1)
Sodium: 138 mmol/L (ref 135–145)
TCO2: 26 mmol/L (ref 0–100)

## 2014-11-30 LAB — GLUCOSE, CAPILLARY
Glucose-Capillary: 105 mg/dL — ABNORMAL HIGH (ref 65–99)
Glucose-Capillary: 106 mg/dL — ABNORMAL HIGH (ref 65–99)
Glucose-Capillary: 106 mg/dL — ABNORMAL HIGH (ref 65–99)
Glucose-Capillary: 110 mg/dL — ABNORMAL HIGH (ref 65–99)
Glucose-Capillary: 111 mg/dL — ABNORMAL HIGH (ref 65–99)
Glucose-Capillary: 114 mg/dL — ABNORMAL HIGH (ref 65–99)
Glucose-Capillary: 116 mg/dL — ABNORMAL HIGH (ref 65–99)
Glucose-Capillary: 117 mg/dL — ABNORMAL HIGH (ref 65–99)
Glucose-Capillary: 118 mg/dL — ABNORMAL HIGH (ref 65–99)
Glucose-Capillary: 120 mg/dL — ABNORMAL HIGH (ref 65–99)
Glucose-Capillary: 121 mg/dL — ABNORMAL HIGH (ref 65–99)
Glucose-Capillary: 123 mg/dL — ABNORMAL HIGH (ref 65–99)
Glucose-Capillary: 125 mg/dL — ABNORMAL HIGH (ref 65–99)
Glucose-Capillary: 131 mg/dL — ABNORMAL HIGH (ref 65–99)
Glucose-Capillary: 134 mg/dL — ABNORMAL HIGH (ref 65–99)
Glucose-Capillary: 135 mg/dL — ABNORMAL HIGH (ref 65–99)
Glucose-Capillary: 137 mg/dL — ABNORMAL HIGH (ref 65–99)
Glucose-Capillary: 139 mg/dL — ABNORMAL HIGH (ref 65–99)
Glucose-Capillary: 145 mg/dL — ABNORMAL HIGH (ref 65–99)
Glucose-Capillary: 146 mg/dL — ABNORMAL HIGH (ref 65–99)
Glucose-Capillary: 148 mg/dL — ABNORMAL HIGH (ref 65–99)
Glucose-Capillary: 150 mg/dL — ABNORMAL HIGH (ref 65–99)
Glucose-Capillary: 173 mg/dL — ABNORMAL HIGH (ref 65–99)
Glucose-Capillary: 183 mg/dL — ABNORMAL HIGH (ref 65–99)
Glucose-Capillary: 213 mg/dL — ABNORMAL HIGH (ref 65–99)

## 2014-11-30 LAB — CBC
HCT: 28.9 % — ABNORMAL LOW (ref 36.0–46.0)
HCT: 30.9 % — ABNORMAL LOW (ref 36.0–46.0)
HCT: 29.1 % — ABNORMAL LOW (ref 36.0–46.0)
Hemoglobin: 9.3 g/dL — ABNORMAL LOW (ref 12.0–15.0)
Hemoglobin: 9.8 g/dL — ABNORMAL LOW (ref 12.0–15.0)
Hemoglobin: 9 g/dL — ABNORMAL LOW (ref 12.0–15.0)
MCH: 28.2 pg (ref 26.0–34.0)
MCH: 28.1 pg (ref 26.0–34.0)
MCH: 28 pg (ref 26.0–34.0)
MCHC: 32.2 g/dL (ref 30.0–36.0)
MCHC: 31.7 g/dL (ref 30.0–36.0)
MCHC: 30.9 g/dL (ref 30.0–36.0)
MCV: 87.6 fL (ref 78.0–100.0)
MCV: 88.5 fL (ref 78.0–100.0)
MCV: 90.4 fL (ref 78.0–100.0)
Platelets: 154 10*3/uL (ref 150–400)
Platelets: 171 10*3/uL (ref 150–400)
Platelets: 132 10*3/uL — ABNORMAL LOW (ref 150–400)
RBC: 3.3 MIL/uL — ABNORMAL LOW (ref 3.87–5.11)
RBC: 3.49 MIL/uL — ABNORMAL LOW (ref 3.87–5.11)
RBC: 3.22 MIL/uL — ABNORMAL LOW (ref 3.87–5.11)
RDW: 14.2 % (ref 11.5–15.5)
RDW: 14.6 % (ref 11.5–15.5)
RDW: 14.7 % (ref 11.5–15.5)
WBC: 15.4 10*3/uL — ABNORMAL HIGH (ref 4.0–10.5)
WBC: 17.2 10*3/uL — ABNORMAL HIGH (ref 4.0–10.5)
WBC: 15.6 10*3/uL — ABNORMAL HIGH (ref 4.0–10.5)

## 2014-11-30 LAB — CREATININE, SERUM
Creatinine, Ser: 1.3 mg/dL — ABNORMAL HIGH (ref 0.44–1.00)
Creatinine, Ser: 1.55 mg/dL — ABNORMAL HIGH (ref 0.44–1.00)
GFR calc Af Amer: 47 mL/min — ABNORMAL LOW (ref >60)
GFR calc Af Amer: 38 mL/min — ABNORMAL LOW (ref >60)
GFR calc non Af Amer: 41 mL/min — ABNORMAL LOW (ref >60)
GFR calc non Af Amer: 33 mL/min — ABNORMAL LOW (ref >60)

## 2014-11-30 LAB — MAGNESIUM
Magnesium: 3.2 mg/dL — ABNORMAL HIGH (ref 1.7–2.4)
Magnesium: 2.9 mg/dL — ABNORMAL HIGH (ref 1.7–2.4)

## 2014-11-30 MED ORDER — ENOXAPARIN SODIUM 40 MG/0.4ML ~~LOC~~ SOLN
40.0000 mg | Freq: Every day | SUBCUTANEOUS | Status: DC
  Filled 2014-11-30: qty 0.4

## 2014-11-30 MED ORDER — DIPHENHYDRAMINE HCL 50 MG/ML IJ SOLN
50.0000 mg | Freq: Once | INTRAMUSCULAR | Status: AC
  Administered 2014-12-01: 50 mg via INTRAVENOUS
  Filled 2014-11-30: qty 1

## 2014-11-30 MED ORDER — INSULIN ASPART 100 UNIT/ML ~~LOC~~ SOLN
0.0000 [IU] | SUBCUTANEOUS | Status: DC
  Administered 2014-11-30: 4 [IU] via SUBCUTANEOUS
  Administered 2014-11-30: 2 [IU] via SUBCUTANEOUS
  Administered 2014-11-30: 8 [IU] via SUBCUTANEOUS
  Administered 2014-11-30 – 2014-12-04 (×9): 2 [IU] via SUBCUTANEOUS

## 2014-11-30 MED ORDER — ENOXAPARIN SODIUM 40 MG/0.4ML ~~LOC~~ SOLN
40.0000 mg | Freq: Every day | SUBCUTANEOUS | Status: DC
  Administered 2014-12-01 – 2014-12-06 (×6): 40 mg via SUBCUTANEOUS
  Filled 2014-11-30 (×8): qty 0.4

## 2014-11-30 MED ORDER — INSULIN DETEMIR 100 UNIT/ML ~~LOC~~ SOLN
25.0000 [IU] | Freq: Two times a day (BID) | SUBCUTANEOUS | Status: DC
  Administered 2014-11-30 – 2014-12-02 (×6): 25 [IU] via SUBCUTANEOUS
  Filled 2014-11-30 (×10): qty 0.25

## 2014-11-30 MED ORDER — FUROSEMIDE 10 MG/ML IJ SOLN
40.0000 mg | Freq: Once | INTRAMUSCULAR | Status: AC
  Administered 2014-11-30: 40 mg via INTRAVENOUS

## 2014-11-30 MED ORDER — LOPERAMIDE HCL 2 MG PO CAPS: 2.0000 mg | ORAL_CAPSULE | Freq: Four times a day (QID) | ORAL | Status: DC | PRN

## 2014-11-30 MED ORDER — INSULIN DETEMIR 100 UNIT/ML ~~LOC~~ SOLN: 25.0000 [IU] | Freq: Two times a day (BID) | SUBCUTANEOUS | Status: DC

## 2014-11-30 MED ORDER — PREDNISONE 50 MG PO TABS
50.0000 mg | ORAL_TABLET | Freq: Every day | ORAL | Status: DC
  Administered 2014-11-30: 50 mg via ORAL
  Filled 2014-11-30 (×3): qty 1

## 2014-11-30 MED ORDER — DICYCLOMINE HCL 20 MG PO TABS
20.0000 mg | ORAL_TABLET | Freq: Two times a day (BID) | ORAL | Status: DC | PRN
  Filled 2014-11-30: qty 1

## 2014-11-30 MED ORDER — INSULIN ASPART 100 UNIT/ML ~~LOC~~ SOLN
4.0000 [IU] | Freq: Three times a day (TID) | SUBCUTANEOUS | Status: DC
  Administered 2014-11-30 – 2014-12-06 (×15): 4 [IU] via SUBCUTANEOUS

## 2014-11-30 NOTE — Progress Notes (Addendum)
Inpatient Diabetes Program Recommendations  AACE/ADA: New Consensus Statement on Inpatient Glycemic Control (2015)  Target Ranges:  Prepandial:   less than 140 mg/dL      Peak postprandial:   less than 180 mg/dL (1-2 hours)      Critically ill patients:  140 - 180 mg/dL   Review of Glycemic Control  Diabetes history: DM2 Outpatient Diabetes medications: None Current orders for Inpatient glycemic control: GlucoStabilizer - Transitioning to Levemir 25 units bid and Novolog 0-24 Q4H + 4 units tidwc  Results for SOTHEARY, GRODI (MRN [id number]) as of 11/30/2014 10:15  Ref. Range 11/29/2014 16:56 11/29/2014 17:54 11/29/2014 18:49 11/29/2014 19:52 11/29/2014 20:54 11/29/2014 21:51 11/29/2014 22:53 11/30/2014 00:29  Glucose-Capillary Latest Ref Range: 65-99 mg/dL 116 (H) 110 (H) 105 (H) 117 (H) 106 (H) 111 (H) 150 (H) 114 (H)   Results for ROBENA, MALONEY (MRN [id number]) as of 11/30/2014 10:15  Ref. Range 11/23/2014 17:44  Hemoglobin A1C Latest Ref Range: 4.8-5.6 % 6.7 (H)   Inpatient Diabetes Program Recommendations:     Since pt is insulin-naive, may need less basal insulin. On no DM meds at home. Consider reducing Levemir to 25 units Q24H (kg x .2 units) Pt will not need insulin at discharge.  Just extubated and starting on CL. Will follow closely. Thank you. Lorenda Peck, RD, LDN, CDE Inpatient Diabetes Coordinator [phone]

## 2014-11-30 NOTE — Progress Notes (Signed)
Pt. Placed back on full support settings. Pt. Unable to performed the vital capacity as expected. Pt. Could only perform .3L.

## 2014-11-30 NOTE — Consult Note (Signed)
CARDIOLOGY CONSULT NOTE   Patient ID: [name] MRN: [id number] DOB/AGE: Mar 20, 1945 69 y.o.  Admit date: 11/23/2014  Primary Physician   [name], MD Primary Cardiologist  Dr. [name]- had a lead revision done by Dr. Lovena Le in 200 Reason for Consultation   RV lead   HPI: [name] is a 69 y.o. female with a history of chronic diastolic heart failure, nonischemic cardiomyopathy- now resolved with biV pacing, morbid obesity, OSA intolerant to CPAP, non obst CAD, HTN, HLD, who was admitted to [facility] from the office on 11/23/14 for sx concerning for [place]. She undwerwent cardiac cath which revealed severe 3V CAD and was referred for CABG. She underwent CABGx5V on 11/29/14. During this procedure the RV pacemaker lead was found to be lodged into the pericardium and required clipping and suture. Electrophysiology has been consulted for further recommendations.  She initially received a dual-chamber pacemaker in 2001 for second degree AV block. She then developed congestive heart failure. At one point left ventricular ejection fraction was severely depressed and her pacemaker was upgraded to a CRT-D device in 2008. She was a "hyperresponder" to CRT and EF returned to normal. Dr. Lovena Le performed a "successful ICD lead revision with utilization of a previously implanted RV pacing lead and capping of the old rate sense portion of the defibrillation lead" in 07/2007. Sequentially, at the time of her generator change out in 2014 her biventricular defibrillator generator was changed out for a biventricular pacemaker (Pine dual-chamber). EF in 2014 w/ EF 65-70% with G1DD. She had a a cath in 2014 with non obstructive disease most mLAD with 40-50% tubular narrowing with systole there was midsystolic bridging with narrowing up to 80%.   She presented to the office with CP and SOB on 11/23/14 and was directly admitted to [facility] for further w/up. An echo done 11/24/14 showed  an EF of 55-60% with G1DD. She underwent LHC on 11/27/14 with revealed 3 vessel CAD but only moderate LAD disease and she was referred for CABG. During this operation her RV pacing lead was noted to be screwed into the pericardium. Op note below  "The coronary arteries were inspected. When attempting to lift the heart to inspect the lateral vessels, there was what felt like an adhesion in the pericardial space. Inspection revealed that this was the right ventricular pacemaker lead, which was screwed into the pericardium. There was no bleeding around this site. An attempt was made to reposition the lead back into the right ventricle; however, the lead would not go into the right ventricle. Therefore, was cut off at the level of the right ventricle free wall, 4 pledgeted suture was placed to close the defect."  EP has been consulted for further recommendations.   Patient is now extubated. She remains on DA and Neo but pressors are being weaned as BP allows. She still has some mild chest pain.    Past Medical History  Diagnosis Date  . Asthma   . Hypertension   . Arthritis   . GERD (gastroesophageal reflux disease)   . Diverticulosis   . Anemia   . CAD (coronary artery disease)   . Fatty liver disease, nonalcoholic   . Internal hemorrhoids   . Gastritis   . Hyperplastic colon polyp   . Sleep apnea   . ICD (implantable cardiac defibrillator) in place   . Shortness of breath   . IBS (irritable bowel syndrome)   . Anxiety   . CHF (  congestive heart failure) ([place])   . H/O hiatal hernia   . Obesity   . Back pain   . Diabetes mellitus without complication [facility])      Past Surgical History  Procedure Laterality Date  . Pacemaker insertion  2001    BS BivICD  . Cardiac defibrillator placement    . Appendectomy    . Knee surgery      right  . Ankle surgery      right  . Tubal ligation    . Cervical spine surgery      plate   . Coronary angiogram  2010  . Abdominal  ultrasound  12/01/2011    Peripelvic cysts- #1- 2.4x1.9x2.3cm, #2-1.2x0.9x1.2cm  . Cardiac catheterization  05/17/1999    No significant coronary obstructive disease w/ mild 20% luminal irregularity of the first diag branch of the LAD  . Cardiac catheterization  07/08/2002    No significant CAD, moderately depressed LV systolic function  . Cardiac catheterization Bilateral 04/26/2007    Normal findings, recommend medical therapy  . Cardiac catheterization  02/18/2008    Moderate CAD, would benefit from having a functional study, recommend continue medical therapy  . Cardiac catheterization  07/23/2012    Medical therapy  . Lexiscan myoview  11/14/2011    Mild-moderate defect seen in Mid Inferolateral and Mid Anterolateral regions-consistant w/ infarct/scar. No significant ischemia demonstrated.  . Transthoracic echocardiogram  07/23/2012    EF 55-60%, normal-mild  . Left heart catheterization with coronary angiogram N/A 07/23/2012    Procedure: LEFT HEART CATHETERIZATION WITH CORONARY ANGIOGRAM;  Surgeon: Leonie Man, MD;  Location: [facility] CATH LAB;  Service: Cardiovascular;  Laterality: N/A;  . Biv pacemaker generator change out N/A 11/02/2012    Procedure: BIV PACEMAKER GENERATOR CHANGE OUT;  Surgeon: Sanda Klein, MD;  Location: [place] CATH LAB;  Service: Cardiovascular;  Laterality: N/A;  . Cardiac catheterization N/A 11/24/2014    Procedure: Left Heart Cath and Coronary Angiography;  Surgeon: Troy Sine, MD;  Location: [place] CV LAB;  Service: Cardiovascular;  Laterality: N/A;  . Cardiac catheterization  11/27/2014    Procedure: Intravascular Pressure Wire/FFR Study;  Surgeon: Peter M Martinique, MD;  Location: [place] CV LAB;  Service: Cardiovascular;;    Allergies  Allergen Reactions  . Ivp Dye [Iodinated Diagnostic Agents] Shortness Of Breath    No reaction to PO contrast with non-ionic dye.06-25-2014/rsm  . Shellfish-Derived Products Anaphylaxis  . Sulfa Antibiotics Shortness Of  Breath  . Iodine Hives  . Zithromax [Azithromycin] Rash    I have reviewed the patient's current medications . acetaminophen  1,000 mg Oral 4 times per day   Or  . acetaminophen (TYLENOL) oral liquid 160 mg/5 mL  1,000 mg Per Tube 4 times per day  . antiseptic oral rinse  7 mL Mouth Rinse QID  . aspirin EC  325 mg Oral Daily   Or  . aspirin  324 mg Per Tube Daily  . bisacodyl  10 mg Oral Daily   Or  . bisacodyl  10 mg Rectal Daily  . budesonide-formoterol  2 puff Inhalation BID  . cefUROXime (ZINACEF)  IV  1.5 g Intravenous Q12H  . chlorhexidine gluconate  15 mL Mouth Rinse BID  . docusate sodium  200 mg Oral Daily  . enoxaparin (LOVENOX) injection  40 mg Subcutaneous QHS  . insulin aspart  0-24 Units Subcutaneous 6 times per day  . insulin aspart  4 Units Subcutaneous TID WC  . insulin detemir  25 Units Subcutaneous BID  . insulin regular  0-10 Units Intravenous TID WC  . levalbuterol  0.63 mg Nebulization QID  . metoprolol tartrate  12.5 mg Oral BID   Or  . metoprolol tartrate  12.5 mg Per Tube BID  . [START ON 12/01/2014] pantoprazole  40 mg Oral Daily  . pravastatin  80 mg Oral QPM  . sodium chloride  3 mL Intravenous Q12H   . sodium chloride 20 mL/hr at 11/30/14 1000  . sodium chloride    . sodium chloride 20 mL/hr at 11/30/14 1000  . dexmedetomidine 0.1 mcg/kg/hr (11/30/14 0600)  . DOPamine Stopped (11/30/14 0900)  . insulin (NOVOLIN-R) infusion 3 Units/hr (11/30/14 0600)  . lactated ringers Stopped (11/29/14 1530)  . lactated ringers 50 mL/hr at 11/30/14 0700  . nitroGLYCERIN Stopped (11/29/14 1530)  . phenylephrine (NEO-SYNEPHRINE) Adult infusion 20 mcg/min (11/30/14 0900)   sodium chloride, dicyclomine, loperamide, metoprolol, midazolam, morphine injection, ondansetron (ZOFRAN) IV, oxyCODONE, sodium chloride, traMADol  Prior to Admission medications   Medication Sig Start Date End Date Taking? Authorizing Provider  albuterol (PROVENTIL HFA;VENTOLIN HFA) 108  (90 BASE) MCG/ACT inhaler Inhale 2 puffs into the lungs every 4 (four) hours as needed for wheezing (wheezing).    Yes Historical Provider, MD  aspirin 81 MG chewable tablet Chew 81 mg by mouth daily.   Yes Historical Provider, MD  bisoprolol (ZEBETA) 5 MG tablet TAKE ONE TABLET BY MOUTH ONCE DAILY. Patient taking differently: TAKE ONE TABLET BY MOUTH ONCE DAILY at bedtime 10/12/14  Yes Tanda Rockers, MD  cetirizine (ZYRTEC) 10 MG tablet Take 10 mg by mouth daily.    Yes Historical Provider, MD  clotrimazole (MYCELEX) 10 MG troche Take 10 mg by mouth every 4 (four) hours as needed (mouth pain).   Yes Historical Provider, MD  docusate sodium (COLACE) 100 MG capsule Take 1 capsule (100 mg total) by mouth daily. Patient taking differently: Take 100 mg by mouth at bedtime.  06/25/14  Yes Pamella Pert, MD  furosemide (LASIX) 40 MG tablet Take 1 tablet (40 mg total) by mouth 2 (two) times daily. 11/04/13  Yes [name], MD  isosorbide mononitrate (IMDUR) 30 MG 24 hr tablet TAKE ONE TABLET BY MOUTH ONCE DAILY 10/12/14  Yes Scott T Kathlen Mody, PA-C  losartan (COZAAR) 100 MG tablet TAKE ONE TABLET BY MOUTH ONCE DAILY 08/08/14  Yes [name], MD  metolazone (ZAROXOLYN) 2.5 MG tablet Take 2.5 mg by mouth 2 (two) times daily as needed (swelling). Take one tablet on Tuesdays and Saturdays 30 minutes before the Furosemide.   Yes Historical Provider, MD  nitroGLYCERIN (NITROSTAT) 0.4 MG SL tablet Place 0.4 mg under the tongue every 5 (five) minutes as needed for chest pain.   Yes Historical Provider, MD  omeprazole (PRILOSEC) 40 MG capsule Take 40 mg by mouth daily.  07/19/13  Yes Historical Provider, MD  ONE TOUCH ULTRA TEST test strip as directed.  10/14/14  Yes Historical Provider, MD  Jonetta Speak LANCETS [id number] MISC as directed.  11/10/14  Yes Historical Provider, MD  potassium chloride SA (K-DUR,KLOR-CON) 20 MEQ tablet Take 20 mEq by mouth daily.    Yes Historical Provider, MD  pravastatin (PRAVACHOL) 80 MG  tablet Take 1 tablet (80 mg total) by mouth every evening. 10/31/14  Yes [name], MD  spironolactone (ALDACTONE) 25 MG tablet Take 0.5 tablets (12.5 mg total) by mouth 2 (two) times daily. 09/29/14  Yes Lorretta Harp, MD  SYMBICORT 160-4.5 MCG/ACT inhaler Inhale 2  puffs into the lungs 2 (two) times daily. 12/02/13  Yes Historical Provider, MD  amLODipine (NORVASC) 10 MG tablet TAKE ONE TABLET BY MOUTH ONCE DAILY Patient taking differently: TAKE ONE TABLET BY MOUTH ONCE daily at bedtime 05/31/14   [name], MD  BIOTIN PO Take 1 tablet by mouth daily.    Historical Provider, MD  dicyclomine (BENTYL) 20 MG tablet Take 1 tablet (20 mg total) by mouth 2 (two) times daily as needed (pain). Patient not taking: Reported on 11/23/2014 07/12/14   Willia Craze, NP  fluticasone [facility]) 50 MCG/ACT nasal spray Place 1 spray into the nose 2 (two) times daily as needed for rhinitis or allergies.     Historical Provider, MD  loperamide (IMODIUM A-D) 2 MG tablet Take 2 mg by mouth 4 (four) times daily as needed for diarrhea or loose stools (diarrhea).    Historical Provider, MD  ondansetron (ZOFRAN) 8 MG tablet Take 1 tablet (8 mg total) by mouth every 8 (eight) hours as needed for nausea or vomiting. Patient not taking: Reported on 11/23/2014 07/07/14   Daleen Bo, MD  triamcinolone cream (KENALOG) 0.1 % Apply 1 application topically daily.  08/24/14   Historical Provider, MD     Social History   Social History  . Marital Status: Widowed    Spouse Name: N/A  . Number of Children: 5  . Years of Education: N/A   Occupational History  . 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 Smoking status: Former Smoker -- 0.25 packs/day for 3 years    Types: Cigarettes    Quit date: 02/11/1968  . Smokeless tobacco: Never Used  . Alcohol Use: No  . Drug Use: No  . Sexual Activity: No   Other Topics Concern  . Not on file   Social History Narrative   Widowed last  year. She lives with her two sons.    No family status information on file.   Family History  Problem Relation Age of Onset  . Breast cancer Mother   . Diabetes Mother   . Heart disease Maternal Grandmother   . Kidney disease Maternal Grandmother   . Diabetes Maternal Grandmother   . Glaucoma Maternal Aunt   . Heart disease Maternal Aunt   . Asthma Sister      ROS:  Full 14 point review of systems complete and found to be negative unless listed above.  Physical Exam: Blood pressure 118/56, pulse 99, temperature 98.4 F (36.9 C), temperature source Core (Comment), resp. rate 4, height 5\' 5"  (1.651 m), weight 266 lb 12.1 oz (121 kg), SpO2 97 %.  General: Well developed, well nourished, female in no acute distress. obese Head: Eyes PERRLA, No xanthomas.   Normocephalic and atraumatic, oropharynx without edema or exudate. Lungs: CTAB Heart: HRRR S1 S2, no rub/gallop, Heart irregular rate and rhythm with S1, S2  murmur. pulses are 2+ extrem.   Neck: No carotid bruits. No lymphadenopathy.  No JVD. Abdomen: Bowel sounds present, abdomen soft and non-tender without masses or hernias noted. Msk:  No spine or cva tenderness. No weakness, no joint deformities or effusions. Extremities: No clubbing or cyanosis. Trace bilateral edema.  Neuro: Alert and oriented X 3. No focal deficits noted. Psych:  Good affect, responds appropriately Skin: No rashes or lesions noted.  Labs:   Lab Results  Component Value Date   WBC 17.2* 11/30/2014   HGB 9.8* 11/30/2014   HCT 30.9* 11/30/2014   MCV 88.5 11/30/2014  PLT 171 11/30/2014    Recent Labs  11/29/14 1554  INR 1.30    Recent Labs Lab 11/23/14 1744  11/30/14 0425 11/30/14 0833  NA 141  < > 138  --   K 4.3  < > 4.1  --   CL 104  < > 106  --   CO2 28  < > 25  --   BUN 15  < > 27*  --   CREATININE 1.20*  < > 1.32* 1.30*  CALCIUM 9.7  < > 9.6  --   PROT 7.2  --   --   --   BILITOT 0.5  --   --   --   ALKPHOS 86  --   --   --     ALT 27  --   --   --   AST 24  --   --   --   GLUCOSE 128*  < > 126*  --   ALBUMIN 3.6  --   --   --   < > = values in this interval not displayed. MAGNESIUM  Date Value Ref Range Status  11/30/2014 3.2* 1.7 - 2.4 mg/dL Final    Lab Results  Component Value Date   CHOL 189 11/24/2014   HDL 51 11/24/2014   LDLCALC 117* 11/24/2014   TRIG 104 11/24/2014     Echo:11/24/2014 LV EF: 55% -  60% Study Conclusions - Left ventricle: The cavity size was normal. Systolic function was normal. The estimated ejection fraction was in the range of 55% to 60%. Regional wall motion abnormalities cannot be excluded. Doppler parameters are consistent with abnormal left ventricular relaxation (grade 1 diastolic dysfunction). Doppler parameters are consistent with elevated ventricular end-diastolic filling pressure. - Ascending aorta: The ascending aorta was mildly dilated, measuring 40 mm. - Mitral valve: Calcified annulus. Mildly thickened leaflets . There was no regurgitation. - Left atrium: The atrium was mildly dilated. - Right ventricle: Pacer wire or catheter noted in right ventricle. Systolic function was normal. - Right atrium: Pacer wire or catheter noted in right atrium. - Tricuspid valve: There was mild regurgitation. - Inferior vena cava: The vessel was normal in size. - Pericardium, extracardiac: There was no pericardial effusion.  ECG: HR 75 Atrial-sensed ventricular-paced rhythm. BIV pacemaker detected  Radiology:  Dg Chest Port 1 View  11/30/2014  CLINICAL DATA:  CABG. EXAM: PORTABLE CHEST 1 VIEW COMPARISON:  11/29/2014. FINDINGS: Endotracheal tube, NG tube, Swan-Ganz catheter, left chest tube in stable position. Cardiac pacer stable position. Prior CABG. Stable cardiomegaly. Interim improvement of bilateral pulmonary edema. Small left pleural effusion. No pneumothorax. IMPRESSION: 1. Lines and tubes in stable position. 2. Cardiac pacer in stable position.  Prior CABG. Stable cardiomegaly. Interim improvement of bilateral pulmonary edema. Electronically Signed   By: [place]   On: 11/30/2014 08:12   Dg Chest Port 1 View  11/29/2014  CLINICAL DATA:  Bypass surgery. EXAM: PORTABLE CHEST 1 VIEW COMPARISON:  11/23/2014. FINDINGS: The endotracheal to is 3.2 cm above the carina. The right IJ Swan-Ganz catheter tip is in the proximal right pulmonary artery. An NG tube is coursing down into the stomach. Mediastinal and left-sided chest tube are in place. No definite pneumothorax. Cardiac enlargement with perihilar pulmonary edema, areas of atelectasis and small left effusion. IMPRESSION: Postop support apparatus in good position without complicating features. Cardiac enlargement, perihilar pulmonary edema, areas of atelectasis and left pleural effusion. No pneumothorax. Electronically Signed   By: Ricky Stabs.D.  On: 11/29/2014 16:00    ASSESSMENT AND PLAN:    Active Problems:   Acute diastolic HF (heart failure) (HCC)   CAD, multiple vessel   S/P CABG x 5   KHIYAH MABEN is a 69 y.o. female with a history of chronic diastolic heart failure, nonischemic cardiomyopathy- now resolved with biV pacing, morbid obesity, OSA intolerant to CPAP, non obst CAD, HTN, HLD, who was admitted to [facility] from the office on 11/23/14 for sx concerning for [place]. She undwerwent cardiac cath which revealed severe 3V CAD and was referred for CABG. She underwent CABGx5V on 11/29/14. During this procedure the RV pacemaker lead was found to be lodged into the pericardium and required clipping and suture. Electrophysiology has been consulted for further recommendations.  RV pacing wire- Amiyah Shryock likely need extraction and replacement, which Pablo Stauffer likely be done sometime next week. See Dr. Curt Bears note for final recs -- We Amariah Kierstead get a Right UE venogram and have the device interrogated  CAD- s/p CABG x 5V -- Now extubated and weaning pressors   Signed: Eileen Stanford,  PA-C 11/30/2014 11:20 AM  Pager [phone]  I have seen and examined this patient with Angelena Form.  Agree with above, note added to reflect my findings.  On exam, regular rhythm, tachycardia, no murmurs, lungs clear.  Patient is POD 1 from CABGx5.  During the procedure, it was noticed that the RV lead was through the RV wall and into the pericardium.  No bleeding was noted.  Interrogation shows functioning RV and LV leads, and a dysfunctional RA lead.  Peyton Rossner need to have lead revision done.  Patient has multiple leads down, Bentleigh Waren get venogram to determine if the vein is currently open.  Lead revision likely to be next week prior to discharge from the hospital.    Dosia Yodice M. Dawud Mays MD 11/30/2014 4:08 PM

## 2014-11-30 NOTE — Progress Notes (Signed)
Wean protocol initiated. 

## 2014-11-30 NOTE — Progress Notes (Signed)
Patient ID: [name], female   DOB: 1945/05/27, 69 y.o.   MRN: [id number]  SICU Evening Rounds  Hemodynamically stable off dopamine and neo  Extubated this am.  Up in chair today  Diuresing well  BMET    Component Value Date/Time   NA 138 11/30/2014 0425   K 4.1 11/30/2014 0425   CL 106 11/30/2014 0425   CO2 25 11/30/2014 0425   GLUCOSE 126* 11/30/2014 0425   BUN 27* 11/30/2014 0425   CREATININE 1.55* 11/30/2014 1754   CREATININE 1.05 04/04/2014 1014   CALCIUM 9.6 11/30/2014 0425   GFRNONAA 33* 11/30/2014 1754   GFRAA 38* 11/30/2014 1754    CBC    Component Value Date/Time   WBC 15.6* 11/30/2014 1754   RBC 3.22* 11/30/2014 1754   HGB 9.0* 11/30/2014 1754   HCT 29.1* 11/30/2014 1754   PLT 132* 11/30/2014 1754   MCV 90.4 11/30/2014 1754   MCH 28.0 11/30/2014 1754   MCHC 30.9 11/30/2014 1754   RDW 14.7 11/30/2014 1754   LYMPHSABS 1.9 11/23/2014 1744   MONOABS 0.6 11/23/2014 1744   EOSABS 0.2 11/23/2014 1744   BASOSABS 0.1 11/23/2014 1744    A/P: stable day. Continue current plans.

## 2014-11-30 NOTE — Care Management Note (Signed)
Case Management Note  Patient Details  Name: [name] MRN: [id number] Date of Birth: December 05, 1945  Subjective/Objective:          Post op CABG x5 on 10-19.  Per nurse has sons.  Just extubated.           Action/Plan:   Expected Discharge Date:                  Expected Discharge Plan:  Home/Self Care  In-House Referral:     Discharge planning Services  CM Consult  Post Acute Care Choice:    Choice offered to:     DME Arranged:    DME Agency:     HH Arranged:    HH Agency:     Status of Service:  In process, will continue to follow  Medicare Important Message Given:  Yes-second notification given Date Medicare IM Given:    Medicare IM give by:    Date Additional Medicare IM Given:    Additional Medicare Important Message give by:     If discussed at [place] of Stay Meetings, dates discussed:    Additional Comments:  Vergie Living, RN 11/30/2014, 11:15 AM

## 2014-11-30 NOTE — Procedures (Signed)
Extubation Procedure Note  Patient Details:   Name: ODETTA DALLEY DOB: 1945-11-03 MRN: [id number]   Pt extubated to 4L [place] per protocol. VS WNL, pt able to vocalize, no stridor noted. Pt tolerating well at this time. RT will continue to monitor.    Evaluation  O2 sats: stable throughout Complications: No apparent complications Patient did tolerate procedure well. Bilateral Breath Sounds: Clear, Diminished Suctioning: Airway Yes  Jetty Peeks 11/30/2014, 9:20 AM

## 2014-11-30 NOTE — Progress Notes (Signed)
1 Day Post-Op Procedure(s) (LRB): CORONARY ARTERY BYPASS GRAFTING x 5 (LIMA-LAD, SVG-D, SVG-OM1-OM2, SVG-PD) (N/A) TRANSESOPHAGEAL ECHOCARDIOGRAM (TEE) (N/A) Subjective: Intubated, but alert and following commands   Objective: Vital signs in last 24 hours: Temp:  [97.5 F (36.4 C)-99 F (37.2 C)] 98.8 F (37.1 C) (10/20 0800) Pulse Rate:  [82-103] 97 (10/20 0815) Cardiac Rhythm:  [-] Atrial paced (10/20 0800) Resp:  [0-25] 25 (10/20 0815) BP: (80-134)/(44-66) 134/60 mmHg (10/20 0815) SpO2:  [95 %-100 %] 98 % (10/20 0800) Arterial Line BP: (79-141)/(50-77) 133/60 mmHg (10/20 0800) FiO2 (%):  [40 %-60 %] 40 % (10/20 0815) Weight:  [266 lb 12.1 oz (121 kg)] 266 lb 12.1 oz (121 kg) (10/20 0500)  Hemodynamic parameters for last 24 hours: PAP: (27-44)/(16-33) 30/16 mmHg CO:  [3.3 L/min-4.7 L/min] 4.4 L/min CI:  [1.6 L/min/m2-2.2 L/min/m2] 2 L/min/m2  Intake/Output from previous day: 10/19 0701 - 10/20 0700 In: 6257.8 [I.V.:4225.8; Blood:292; NG/GT:60; IV [id number] Out: [id number] [Urine:5050; Emesis/NG output:50; Blood:2319; Chest Tube:350] Intake/Output this shift:    General appearance: alert, cooperative and no distress Neurologic: intact Heart: regular rate and rhythm Lungs: clear to auscultation bilaterally Abdomen: normal findings: soft, non-tender  Lab Results:  Recent Labs  11/29/14 2130 11/29/14 2152 11/30/14 0425  WBC 13.7*  --  15.4*  HGB 9.8* 18.0* 9.3*  HCT 30.4* 53.0* 28.9*  PLT 128*  --  154   BMET:  Recent Labs  11/29/14 0020  11/29/14 2152 11/30/14 0425  NA 138  < > 137 138  K 4.3  < > 4.3 4.1  CL 98*  < > 108 106  CO2 30  --   --  25  GLUCOSE 159*  < > 121* 126*  BUN 47*  < > 30* 27*  CREATININE 1.72*  < > 1.10* 1.32*  CALCIUM 9.2  --   --  9.6  < > = values in this interval not displayed.  PT/INR:  Recent Labs  11/29/14 1554  LABPROT 16.4*  INR 1.30   ABG    Component Value Date/Time   PHART 7.376 11/30/2014 0617   HCO3 27.4*  11/30/2014 0617   TCO2 29 11/30/2014 0617   ACIDBASEDEF 4.0* 11/29/2014 1541   O2SAT 85.0 11/30/2014 0617   CBG (last 3)   Recent Labs  11/29/14 2151 11/29/14 2253 11/30/14 0029  GLUCAP 111* 150* 114*    Assessment/Plan: S/P Procedure(s) (LRB): CORONARY ARTERY BYPASS GRAFTING x 5 (LIMA-LAD, SVG-D, SVG-OM1-OM2, SVG-PD) (N/A) TRANSESOPHAGEAL ECHOCARDIOGRAM (TEE) (N/A) -  CV- good hemodynamics, wean dopamine and neo as BP allows  Dc swan. Dc A line after drips off  RESP- remained intubated overnight, wean to extubate  CXR shows improved pulmonary edema, some left lower lobe atelectasis  RENAL- creatinine Ok, diurese  ENDO- CBG well controlled  SCD + enoxaparin for DVT prophylaxis  Anemia secondary to ABL- follow  Mobilize OOB once extubated     LOS: 7 days    Melrose Nakayama 11/30/2014

## 2014-11-30 NOTE — Progress Notes (Signed)
Placed pt. On bipap per order and due to pt. Distress.

## 2014-12-01 ENCOUNTER — Inpatient Hospital Stay (HOSPITAL_COMMUNITY): Payer: Medicare Other

## 2014-12-01 DIAGNOSIS — E8779 Other fluid overload: Secondary | ICD-10-CM

## 2014-12-01 DIAGNOSIS — Z951 Presence of aortocoronary bypass graft: Secondary | ICD-10-CM

## 2014-12-01 LAB — BASIC METABOLIC PANEL
Anion gap: 8 (ref 5–15)
Anion gap: 7 (ref 5–15)
BUN: 28 mg/dL — ABNORMAL HIGH (ref 6–20)
BUN: 31 mg/dL — ABNORMAL HIGH (ref 6–20)
CO2: 30 mmol/L (ref 22–32)
CO2: 31 mmol/L (ref 22–32)
Calcium: 9 mg/dL (ref 8.9–10.3)
Calcium: 8.8 mg/dL — ABNORMAL LOW (ref 8.9–10.3)
Chloride: 103 mmol/L (ref 101–111)
Chloride: 102 mmol/L (ref 101–111)
Creatinine, Ser: 1.43 mg/dL — ABNORMAL HIGH (ref 0.44–1.00)
Creatinine, Ser: 1.44 mg/dL — ABNORMAL HIGH (ref 0.44–1.00)
GFR calc Af Amer: 42 mL/min — ABNORMAL LOW (ref >60)
GFR calc Af Amer: 42 mL/min — ABNORMAL LOW (ref >60)
GFR calc non Af Amer: 36 mL/min — ABNORMAL LOW (ref >60)
GFR calc non Af Amer: 36 mL/min — ABNORMAL LOW (ref >60)
Glucose, Bld: 151 mg/dL — ABNORMAL HIGH (ref 65–99)
Glucose, Bld: 124 mg/dL — ABNORMAL HIGH (ref 65–99)
Potassium: 4.6 mmol/L (ref 3.5–5.1)
Potassium: 3.9 mmol/L (ref 3.5–5.1)
Sodium: 141 mmol/L (ref 135–145)
Sodium: 140 mmol/L (ref 135–145)

## 2014-12-01 LAB — CBC
HCT: 27.4 % — ABNORMAL LOW (ref 36.0–46.0)
Hemoglobin: 8.6 g/dL — ABNORMAL LOW (ref 12.0–15.0)
MCH: 28.7 pg (ref 26.0–34.0)
MCHC: 31.4 g/dL (ref 30.0–36.0)
MCV: 91.3 fL (ref 78.0–100.0)
Platelets: 105 10*3/uL — ABNORMAL LOW (ref 150–400)
RBC: 3 MIL/uL — ABNORMAL LOW (ref 3.87–5.11)
RDW: 14.9 % (ref 11.5–15.5)
WBC: 15.3 10*3/uL — ABNORMAL HIGH (ref 4.0–10.5)

## 2014-12-01 LAB — POCT I-STAT 3, ART BLOOD GAS (G3+)
Acid-Base Excess: 4 mmol/L — ABNORMAL HIGH (ref 0.0–2.0)
Bicarbonate: 28.2 mEq/L — ABNORMAL HIGH (ref 20.0–24.0)
Bicarbonate: 30.6 mEq/L — ABNORMAL HIGH (ref 20.0–24.0)
O2 Saturation: 92 %
O2 Saturation: 93 %
Patient temperature: 97.8
Patient temperature: 97.8
TCO2: 30 mmol/L (ref 0–100)
TCO2: 32 mmol/L (ref 0–100)
pCO2 arterial: 52.2 mmHg — ABNORMAL HIGH (ref 35.0–45.0)
pCO2 arterial: 61.6 mmHg (ref 35.0–45.0)
pH, Arterial: 7.266 — ABNORMAL LOW (ref 7.350–7.450)
pH, Arterial: 7.374 (ref 7.350–7.450)
pO2, Arterial: 69 mmHg — ABNORMAL LOW (ref 80.0–100.0)
pO2, Arterial: 73 mmHg — ABNORMAL LOW (ref 80.0–100.0)

## 2014-12-01 LAB — GLUCOSE, CAPILLARY
Glucose-Capillary: 128 mg/dL — ABNORMAL HIGH (ref 65–99)
Glucose-Capillary: 132 mg/dL — ABNORMAL HIGH (ref 65–99)
Glucose-Capillary: 139 mg/dL — ABNORMAL HIGH (ref 65–99)
Glucose-Capillary: 158 mg/dL — ABNORMAL HIGH (ref 65–99)
Glucose-Capillary: 96 mg/dL (ref 65–99)

## 2014-12-01 MED ORDER — POTASSIUM CHLORIDE 10 MEQ/50ML IV SOLN: 10.0000 meq | INTRAVENOUS | Status: DC | PRN

## 2014-12-01 MED ORDER — LIDOCAINE HCL 1 % IJ SOLN
INTRAMUSCULAR | Status: AC
  Filled 2014-12-01: qty 20

## 2014-12-01 MED ORDER — FUROSEMIDE 10 MG/ML IJ SOLN
40.0000 mg | Freq: Once | INTRAMUSCULAR | Status: AC
  Administered 2014-12-01: 40 mg via INTRAVENOUS
  Filled 2014-12-01: qty 4

## 2014-12-01 MED ORDER — IOHEXOL 300 MG/ML  SOLN: 50.0000 mL | Freq: Once | INTRAMUSCULAR | Status: DC | PRN

## 2014-12-01 MED FILL — Heparin Sodium (Porcine) Inj 1000 Unit/ML: INTRAMUSCULAR | Qty: 30 | Status: AC

## 2014-12-01 MED FILL — Magnesium Sulfate Inj 50%: INTRAMUSCULAR | Qty: 10 | Status: AC

## 2014-12-01 MED FILL — Potassium Chloride Inj 2 mEq/ML: INTRAVENOUS | Qty: 40 | Status: AC

## 2014-12-01 NOTE — Progress Notes (Signed)
2 Days Post-Op Procedure(s) (LRB): CORONARY ARTERY BYPASS GRAFTING x 5 (LIMA-LAD, SVG-D, SVG-OM1-OM2, SVG-PD) (N/A) TRANSESOPHAGEAL ECHOCARDIOGRAM (TEE) (N/A) Subjective:  Does not like the bipap. She was placed on bipap overnight due to hypoxemia and hypercarbia with improvement. Reportedly on CPAP at home for OSA.  Objective: Vital signs in last 24 hours: Temp:  [97.5 F (36.4 C)-98.6 F (37 C)] 98.5 F (36.9 C) (10/21 0756) Pulse Rate:  [85-111] 86 (10/21 0600) Cardiac Rhythm:  [-] Ventricular paced (10/21 0738) Resp:  [4-30] 14 (10/21 0600) BP: (83-134)/(30-76) 94/50 mmHg (10/21 0600) SpO2:  [93 %-100 %] 100 % (10/21 0600) Arterial Line BP: (96-159)/(48-72) 147/63 mmHg (10/20 1700) FiO2 (%):  [40 %-70 %] 60 % (10/21 0306) Weight:  [117.1 kg (258 lb 2.5 oz)] 117.1 kg (258 lb 2.5 oz) (10/21 0600)  Hemodynamic parameters for last 24 hours: PAP: (29-43)/(15-26) 43/26 mmHg CO:  [6.1 L/min] 6.1 L/min CI:  [2.8 L/min/m2] 2.8 L/min/m2  Intake/Output from previous day: 10/20 0701 - 10/21 0700 In: 592 [I.V.:592] Out: 3910 [Urine:3860; Chest Tube:50] Intake/Output this shift:    General appearance: alert and cooperative Neurologic: intact Heart: regular rate and rhythm, S1, S2 normal, no murmur, click, rub or gallop Lungs: clear to auscultation bilaterally Abdomen: soft, non-tender; bowel sounds normal; no masses,  no organomegaly Extremities: edema moderate diffuse Wound: dressing dry  Lab Results:  Recent Labs  11/30/14 1754 12/01/14 0400  WBC 15.6* 15.3*  HGB 9.0* 8.6*  HCT 29.1* 27.4*  PLT 132* 105*   BMET:  Recent Labs  11/30/14 0425  11/30/14 1740 11/30/14 1754 12/01/14 0400  NA 138  --  138  --  141  K 4.1  --  4.1  --  4.6  CL 106  --  101  --  103  CO2 25  --   --   --  30  GLUCOSE 126*  --  172*  --  151*  BUN 27*  --  37*  --  28*  CREATININE 1.32*  < > 1.50* 1.55* 1.43*  CALCIUM 9.6  --   --   --  9.0  < > = values in this interval not  displayed.  PT/INR:  Recent Labs  11/29/14 1554  LABPROT 16.4*  INR 1.30   ABG    Component Value Date/Time   PHART 7.374 12/01/2014 0128   HCO3 30.6* 12/01/2014 0128   TCO2 32 12/01/2014 0128   ACIDBASEDEF 4.0* 11/29/2014 1541   O2SAT 93.0 12/01/2014 0128   CBG (last 3)   Recent Labs  11/30/14 1918 11/30/14 2334 12/01/14 0355  GLUCAP 145* 139* 128*   CLINICAL DATA: CABG.  EXAM: PORTABLE CHEST 1 VIEW  COMPARISON: 11/30/2014.  FINDINGS: Interim removal of endotracheal tube, NG tube, Swan-Ganz catheter, mediastinal drainage catheter, left chest tube. Cardiac pacer in stable position prior CABG. Cardiomegaly. Persistent bibasilar atelectasis. Small left pleural effusion cannot be excluded P No pneumothorax. Prior cervical spine fusion .  IMPRESSION: 1. Interim removal of lines and tubes. No pneumothorax. 2. Cardiac pacer in stable position . Prior CABG. Heart size stable. 3. Low lung volumes with bibasilar atelectasis. Small left pleural effusion cannot be excluded .   Electronically Signed  By: Marcello Moores Register  On: 12/01/2014 08:00  Assessment/Plan: S/P Procedure(s) (LRB): CORONARY ARTERY BYPASS GRAFTING x 5 (LIMA-LAD, SVG-D, SVG-OM1-OM2, SVG-PD) (N/A) TRANSESOPHAGEAL ECHOCARDIOGRAM (TEE) (N/A)  She is hemodynamically stable on neo. Wean as tolerated. Mobilize out of bed Diuresis: - 3200 cc yesterday and wt down to 258  but still 16 lbs over preop Diabetes control Continue foley due to diuresing patient and patient in ICU Work on IS, get flutter valve bipap as needed   LOS: 8 days    Gaye Pollack 12/01/2014

## 2014-12-01 NOTE — Progress Notes (Signed)
Patient ID: BEAUX ENERSON, female   DOB: 04/17/1945, 69 y.o.   MRN: [id number] EVENING ROUNDS NOTE :     [address]       [place] [address]             [phone]                 2 Days Post-Op Procedure(s) (LRB): CORONARY ARTERY BYPASS GRAFTING x 5 (LIMA-LAD, SVG-D, SVG-OM1-OM2, SVG-PD) (N/A) TRANSESOPHAGEAL ECHOCARDIOGRAM (TEE) (N/A)  Total Length of Stay:  LOS: 8 days  BP 100/54 mmHg  Pulse 104  Temp(Src) 98.2 F (36.8 C) (Oral)  Resp 24  Ht 5\' 5"  (1.651 m)  Wt 258 lb 2.5 oz (117.1 kg)  BMI 42.96 kg/m2  SpO2 100%  .Intake/Output      10/21 0701 - 10/22 0700   P.O. 250   I.V. (mL/kg) 359 (3.1)   Total Intake(mL/kg) 609 (5.2)   Urine (mL/kg/hr) 1400 (0.8)   Chest Tube    Total Output 1400   Net -791         . sodium chloride 20 mL/hr at 11/30/14 1000  . sodium chloride    . sodium chloride 20 mL/hr at 12/01/14 0600  . insulin (NOVOLIN-R) infusion 3 Units/hr (11/30/14 0600)  . lactated ringers Stopped (11/29/14 1530)  . lactated ringers 20 mL/hr at 12/01/14 0300  . phenylephrine (NEO-SYNEPHRINE) Adult infusion 15 mcg/min (12/01/14 1900)     Lab Results  Component Value Date   WBC 15.3* 12/01/2014   HGB 8.6* 12/01/2014   HCT 27.4* 12/01/2014   PLT 105* 12/01/2014   GLUCOSE 124* 12/01/2014   CHOL 189 11/24/2014   TRIG 104 11/24/2014   HDL 51 11/24/2014   LDLCALC 117* 11/24/2014   ALT 27 11/23/2014   AST 24 11/23/2014   NA 140 12/01/2014   K 3.9 12/01/2014   CL 102 12/01/2014   CREATININE 1.44* 12/01/2014   BUN 31* 12/01/2014   CO2 31 12/01/2014   TSH 2.505 11/23/2014   INR 1.30 11/29/2014   HGBA1C 6.7* 11/23/2014   Stable day, walked some   Grace Isaac MD  Beeper [phone] Office [phone] 12/01/2014 9:25 PM

## 2014-12-01 NOTE — Procedures (Signed)
Bilateral upper extremity and central venograms completed No sig central stenosis or occlusion Full report in PACS

## 2014-12-01 NOTE — Care Management Note (Signed)
Case Management Note  Patient Details  Name: HAWANYA HECKSEL MRN: [id number] Date of Birth: [birth year]  Subjective/Objective:      Lives at home with two sons,  She works and says they do not assist her much and won't be available post discharge to assist.  Explained that her insurance my not approve a rehab for her, depending on progression and she may need to go home with 24/7 assistance.  States doesn't have anyone else that could assist her.  Respiratory issues last night, fluid overload.  Will continue to follow.               Action/Plan:   Expected Discharge Date:                  Expected Discharge Plan:  [place]  In-House Referral:     Discharge planning Services  CM Consult  Post Acute Care Choice:    Choice offered to:     DME Arranged:    DME Agency:     HH Arranged:    [place] Agency:     Status of Service:  In process, will continue to follow  Medicare Important Message Given:  Yes-second notification given Date Medicare IM Given:    Medicare IM give by:    Date Additional Medicare IM Given:    Additional Medicare Important Message give by:     If discussed at [place] of Stay Meetings, dates discussed:    Additional Comments:  Vergie Living, RN 12/01/2014, 10:53 AM

## 2014-12-01 NOTE — Progress Notes (Signed)
Patient Profile: [name] is a 69 y.o. female with a history of chronic diastolic heart failure, nonischemic cardiomyopathy- now resolved with biV pacing, morbid obesity, OSA intolerant to CPAP, non obst CAD, HTN, HLD. Admitted 10/13 for USA.-->LHC showed severe 3VD-->s/p CABG x 5. During this procedure the RV pacemaker lead was found to be lodged into the pericardium and required clipping and suture. Plan per nephrology is for lead revision next week. Post CABG recover complicated by volume overload.   Subjective: Feels a little short of breath but no CP.   Objective: Vital signs in last 24 hours: Temp:  [97.5 F (36.4 C)-99.6 F (37.6 C)] 99.6 F (37.6 C) (10/21 1219) Pulse Rate:  [85-109] 86 (10/21 0600) Resp:  [14-30] 14 (10/21 0600) BP: (83-114)/(30-76) 94/50 mmHg (10/21 0600) SpO2:  [93 %-100 %] 100 % (10/21 0600) Arterial Line BP: (142-156)/(62-64) 147/63 mmHg (10/20 1700) FiO2 (%):  [60 %-70 %] 60 % (10/21 0306) Weight:  [258 lb 2.5 oz (117.1 kg)] 258 lb 2.5 oz (117.1 kg) (10/21 0600) Last BM Date: 11/28/14  Intake/Output from previous day: 10/20 0701 - 10/21 0700 In: 592 [I.V.:592] Out: 3910 [Urine:3860; Chest Tube:50] Intake/Output this shift:    Medications Current Facility-Administered Medications  Medication Dose Route Frequency Provider Last Rate Last Dose  . 0.45 % sodium chloride infusion   Intravenous Continuous PRN Coolidge Breeze, PA-C 20 mL/hr at 11/30/14 1000    . 0.9 %  sodium chloride infusion  250 mL Intravenous Continuous Coolidge Breeze, PA-C   250 mL at 11/30/14 0600  . 0.9 %  sodium chloride infusion   Intravenous Continuous Coolidge Breeze, PA-C 20 mL/hr at 12/01/14 0600    . acetaminophen (TYLENOL) tablet 1,000 mg  1,000 mg Oral 4 times per day Coolidge Breeze, PA-C   1,000 mg at 12/01/14 1230   Or  . acetaminophen (TYLENOL) solution 1,000 mg  1,000 mg Per Tube 4 times per day Coolidge Breeze, PA-C      . antiseptic oral rinse solution  (CORINZ)  7 mL Mouth Rinse QID Melrose Nakayama, MD   7 mL at 12/01/14 1200  . aspirin EC tablet 325 mg  325 mg Oral Daily Coolidge Breeze, PA-C   325 mg at 12/01/14 [id number]   Or  . aspirin chewable tablet 324 mg  324 mg Per Tube Daily Gina L Collins, PA-C      . bisacodyl (DULCOLAX) EC tablet 10 mg  10 mg Oral Daily Coolidge Breeze, PA-C   10 mg at 12/01/14 [id number]   Or  . bisacodyl (DULCOLAX) suppository 10 mg  10 mg Rectal Daily Coolidge Breeze, PA-C      . budesonide-formoterol (SYMBICORT) 160-4.5 MCG/ACT inhaler 2 puff  2 puff Inhalation BID [name], NP   2 puff at 12/01/14 0753  . chlorhexidine gluconate (PERIDEX) 0.12 % solution 15 mL  15 mL Mouth Rinse BID Melrose Nakayama, MD   15 mL at 12/01/14 0800  . dicyclomine (BENTYL) tablet 20 mg  20 mg Oral BID PRN Melrose Nakayama, MD      . docusate sodium (COLACE) capsule 200 mg  200 mg Oral Daily Coolidge Breeze, PA-C   200 mg at 12/01/14 0949  . enoxaparin (LOVENOX) injection 40 mg  40 mg Subcutaneous QHS Monia Sabal, PA-C   40 mg at 11/30/14 2200  . furosemide (LASIX) injection 40 mg  40 mg Intravenous Once Gaye Pollack,  MD      . insulin aspart (novoLOG) injection 0-24 Units  0-24 Units Subcutaneous 6 times per day Melrose Nakayama, MD   2 Units at 12/01/14 1230  . insulin aspart (novoLOG) injection 4 Units  4 Units Subcutaneous TID WC Melrose Nakayama, MD   4 Units at 12/01/14 1318  . insulin detemir (LEVEMIR) injection 25 Units  25 Units Subcutaneous BID Melrose Nakayama, MD   25 Units at 12/01/14 [phone]  . insulin regular (NOVOLIN R,HUMULIN R) 250 Units in sodium chloride 0.9 % 250 mL (1 Units/mL) infusion   Intravenous Continuous Coolidge Breeze, PA-C 3 mL/hr at 11/30/14 0600 3 Units/hr at 11/30/14 0600  . insulin regular bolus via infusion 0-10 Units  0-10 Units Intravenous TID WC Coolidge Breeze, PA-C   4 Units at 11/30/14 1700  . iohexol (OMNIPAQUE) 300 MG/ML solution 50 mL  50 mL Intravenous Once PRN Medication  Radiologist, MD      . lactated ringers infusion   Intravenous Continuous Coolidge Breeze, PA-C   Stopped at 11/29/14 1530  . lactated ringers infusion   Intravenous Continuous Coolidge Breeze, PA-C 20 mL/hr at 12/01/14 0300    . levalbuterol (XOPENEX) nebulizer solution 0.63 mg  0.63 mg Nebulization QID Melrose Nakayama, MD   0.63 mg at 12/01/14 [id number]  . lidocaine (XYLOCAINE) 1 % (with pres) injection           . loperamide (IMODIUM) capsule 2 mg  2 mg Oral QID PRN Melrose Nakayama, MD      . morphine 2 MG/ML injection 2-5 mg  2-5 mg Intravenous Q1H PRN Coolidge Breeze, PA-C   4 mg at 11/30/14 1457  . ondansetron (ZOFRAN) injection 4 mg  4 mg Intravenous Q6H PRN Coolidge Breeze, PA-C      . oxyCODONE (Oxy IR/ROXICODONE) immediate release tablet 5-10 mg  5-10 mg Oral Q3H PRN Coolidge Breeze, PA-C      . pantoprazole (PROTONIX) EC tablet 40 mg  40 mg Oral Daily Coolidge Breeze, PA-C   40 mg at 12/01/14 0949  . phenylephrine (NEO-SYNEPHRINE) 20 mg in dextrose 5 % 250 mL (0.08 mg/mL) infusion  0-100 mcg/min Intravenous Titrated Arlyn Leak Collins, PA-C 26.3 mL/hr at 12/01/14 0600 35 mcg/min at 12/01/14 0600  . pravastatin (PRAVACHOL) tablet 80 mg  80 mg Oral QPM [name], NP   80 mg at 11/30/14 1738  . predniSONE (DELTASONE) tablet 50 mg  50 mg Oral Q breakfast [name], DO   50 mg at 11/30/14 2206  . sodium chloride 0.9 % injection 3 mL  3 mL Intravenous Q12H Gina L Collins, PA-C   3 mL at 12/01/14 1000  . sodium chloride 0.9 % injection 3 mL  3 mL Intravenous PRN Coolidge Breeze, PA-C      . traMADol (ULTRAM) tablet 50-100 mg  50-100 mg Oral Q4H PRN Coolidge Breeze, PA-C        PE: General appearance: alert, cooperative, no distress and morbidly obese Neck: no carotid bruit, no JVD and obese Lungs: clear to auscultation bilaterally Heart: regular rate and rhythm, S1, S2 normal, no murmur, click, rub or gallop Extremities: diffuse tense billateral LEE Pulses: 2+ and symmetric Skin: warm  and dry Neurologic: Grossly normal  Lab Results:   Recent Labs  11/30/14 0833 11/30/14 1740 11/30/14 1754 12/01/14 0400  WBC 17.2*  --  15.6* 15.3*  HGB 9.8* 9.9* 9.0* 8.6*  HCT  30.9* 29.0* 29.1* 27.4*  PLT 171  --  132* 105*   BMET  Recent Labs  11/29/14 0020  11/30/14 0425  11/30/14 1740 11/30/14 1754 12/01/14 0400  NA 138  < > 138  --  138  --  141  K 4.3  < > 4.1  --  4.1  --  4.6  CL 98*  < > 106  --  101  --  103  CO2 30  --  25  --   --   --  30  GLUCOSE 159*  < > 126*  --  172*  --  151*  BUN 47*  < > 27*  --  37*  --  28*  CREATININE 1.72*  < > 1.32*  < > 1.50* 1.55* 1.43*  CALCIUM 9.2  --  9.6  --   --   --  9.0  < > = values in this interval not displayed. PT/INR  Recent Labs  11/28/14 1638 11/29/14 1400 11/29/14 1554  LABPROT 13.3 21.4* 16.4*  INR 0.99 1.87* 1.30    Assessment/Plan  Active Problems:   Acute diastolic HF (heart failure) (HCC)   CAD, multiple vessel   S/P CABG x 5   1. CAD: s/p CABG x 5. Denies CP.  2. Acute Diastolic CHF: volume still up. Admit weight was 249. Current weight is 258. Continue lasix, 40 mg IV BID.   3. RV Lead Complication: Seen by EP yesterday. Plan is for lead revision, likely next week.   4. Renal Insufficiency: SCr improved since yesterday at 1.55-->1.43.   5. Hypotension: remains on levo. Continue support per CT surgery.     LOS: 8 days    Brittainy M. Rosita Fire, PA-C 12/01/2014 2:30 PM   Patient seen and examined. Agree with assessment and plan. Good diuresis yesterday -3375;  pre-op weight 242, today 258. Continue iv diuresis. A paced rhythm. No angina.    Troy Sine, MD, [facility] 12/01/2014 5:41 PM

## 2014-12-01 NOTE — Progress Notes (Signed)
Pt with increased work of breathing, decreased O2 sats in the 80's, placed on Bipap. Dr. Cyndia Bent notified of event and ABG results. Orders received, will continue to monitor.

## 2014-12-02 LAB — GLUCOSE, CAPILLARY
Glucose-Capillary: 82 mg/dL (ref 65–99)
Glucose-Capillary: 90 mg/dL (ref 65–99)
Glucose-Capillary: 82 mg/dL (ref 65–99)
Glucose-Capillary: 120 mg/dL — ABNORMAL HIGH (ref 65–99)
Glucose-Capillary: 85 mg/dL (ref 65–99)
Glucose-Capillary: 123 mg/dL — ABNORMAL HIGH (ref 65–99)

## 2014-12-02 LAB — BASIC METABOLIC PANEL
Anion gap: 13 (ref 5–15)
BUN: 32 mg/dL — ABNORMAL HIGH (ref 6–20)
CO2: 29 mmol/L (ref 22–32)
Calcium: 8.7 mg/dL — ABNORMAL LOW (ref 8.9–10.3)
Chloride: 98 mmol/L — ABNORMAL LOW (ref 101–111)
Creatinine, Ser: 1.29 mg/dL — ABNORMAL HIGH (ref 0.44–1.00)
GFR calc Af Amer: 47 mL/min — ABNORMAL LOW (ref >60)
GFR calc non Af Amer: 41 mL/min — ABNORMAL LOW (ref >60)
Glucose, Bld: 94 mg/dL (ref 65–99)
Potassium: 3.4 mmol/L — ABNORMAL LOW (ref 3.5–5.1)
Sodium: 140 mmol/L (ref 135–145)

## 2014-12-02 LAB — CBC WITH DIFFERENTIAL/PLATELET
Basophils Absolute: 0 10*3/uL (ref 0.0–0.1)
Basophils Relative: 0 %
Eosinophils Absolute: 0.1 10*3/uL (ref 0.0–0.7)
Eosinophils Relative: 1 %
HCT: 26.4 % — ABNORMAL LOW (ref 36.0–46.0)
Hemoglobin: 8.4 g/dL — ABNORMAL LOW (ref 12.0–15.0)
Lymphocytes Relative: 12 %
Lymphs Abs: 1.8 10*3/uL (ref 0.7–4.0)
MCH: 29.1 pg (ref 26.0–34.0)
MCHC: 31.8 g/dL (ref 30.0–36.0)
MCV: 91.3 fL (ref 78.0–100.0)
Monocytes Absolute: 1.5 10*3/uL — ABNORMAL HIGH (ref 0.1–1.0)
Monocytes Relative: 10 %
Neutro Abs: 11.8 10*3/uL — ABNORMAL HIGH (ref 1.7–7.7)
Neutrophils Relative %: 77 %
Platelets: 160 10*3/uL (ref 150–400)
RBC: 2.89 MIL/uL — ABNORMAL LOW (ref 3.87–5.11)
RDW: 14.8 % (ref 11.5–15.5)
WBC: 15.2 10*3/uL — ABNORMAL HIGH (ref 4.0–10.5)

## 2014-12-02 MED ORDER — LACTULOSE 10 GM/15ML PO SOLN
10.0000 g | Freq: Once | ORAL | Status: AC
  Administered 2014-12-03: 10 g via ORAL

## 2014-12-02 MED ORDER — LACTULOSE 10 GM/15ML PO SOLN
10.0000 g | Freq: Once | ORAL | Status: DC
  Filled 2014-12-02: qty 15

## 2014-12-02 MED ORDER — FUROSEMIDE 10 MG/ML IJ SOLN
40.0000 mg | Freq: Once | INTRAMUSCULAR | Status: AC
  Administered 2014-12-02: 40 mg via INTRAVENOUS
  Filled 2014-12-02: qty 4

## 2014-12-02 NOTE — Consult Note (Signed)
SUBJECTIVE: The patient is doing well today.  At this time, she denies chest pain, shortness of breath, or any new concerns.  Continues to be on neo for BP support.  Has gotten out of the bed and into the chair.  [name] acetaminophen  1,000 mg Oral 4 times per day   Or  . acetaminophen (TYLENOL) oral liquid 160 mg/5 mL  1,000 mg Per Tube 4 times per day  . antiseptic oral rinse  7 mL Mouth Rinse QID  . aspirin EC  325 mg Oral Daily   Or  . aspirin  324 mg Per Tube Daily  . bisacodyl  10 mg Oral Daily   Or  . bisacodyl  10 mg Rectal Daily  . budesonide-formoterol  2 puff Inhalation BID  . chlorhexidine gluconate  15 mL Mouth Rinse BID  . docusate sodium  200 mg Oral Daily  . enoxaparin (LOVENOX) injection  40 mg Subcutaneous QHS  . insulin aspart  0-24 Units Subcutaneous 6 times per day  . insulin aspart  4 Units Subcutaneous TID WC  . insulin detemir  25 Units Subcutaneous BID  . insulin regular  0-10 Units Intravenous TID WC  . levalbuterol  0.63 mg Nebulization QID  . pantoprazole  40 mg Oral Daily  . pravastatin  80 mg Oral QPM  . predniSONE  50 mg Oral Q breakfast  . sodium chloride  3 mL Intravenous Q12H   . sodium chloride Stopped (12/01/14 1900)  . sodium chloride Stopped (12/01/14 1900)  . sodium chloride Stopped (12/01/14 1900)  . insulin (NOVOLIN-R) infusion 3 Units/hr (11/30/14 0600)  . lactated ringers Stopped (11/29/14 1530)  . lactated ringers 20 mL/hr at 12/02/14 0600  . phenylephrine (NEO-SYNEPHRINE) Adult infusion 30 mcg/min (12/02/14 0800)    OBJECTIVE: Physical Exam: Filed Vitals:   12/02/14 0530 12/02/14 0600 12/02/14 0630 12/02/14 0802  BP: 120/56 105/50 103/57   Pulse: 93 104 93   Temp:    98 F (36.7 C)  TempSrc:    Oral  Resp: 21 19 22    Height:      Weight:  253 lb 1.4 oz (114.8 kg)    SpO2: 100% 100% 99% 100%    Intake/Output Summary (Last 24 hours) at 12/02/14 0844 Last data filed at 12/02/14 0800  Gross per 24 hour  Intake 1405.19 ml    Output   3175 ml  Net -1769.81 ml    Telemetry reveals sinus rhythm  GEN- The patient is well appearing, alert and oriented x 3 today.   Head- normocephalic, atraumatic, moon facies Eyes-  Sclera clear Ears- hearing intact Neck- supple, no JVP, IJ line in place Lungs- Clear to ausculation bilaterally, normal work of breathing Heart- Regular rate and rhythm, no murmurs, rubs or gallops, PMI not laterally displaced GI- soft, NT, ND, + BS Extremities- no clubbing, cyanosis, or edema Skin- no rash or lesion Psych- euthymic mood, full affect Neuro- strength and sensation are intact  LABS: Basic Metabolic Panel:  Recent Labs  11/30/14 0425  11/30/14 1754  12/01/14 1723 12/02/14 0627  NA 138  < >  --   < > 140 140  K 4.1  < >  --   < > 3.9 3.4*  CL 106  < >  --   < > 102 98*  CO2 25  --   --   < > 31 29  GLUCOSE 126*  < >  --   < > 124* 94  BUN 27*  < >  --   < >  31* 32*  CREATININE 1.32*  < > 1.55*  < > 1.44* 1.29*  CALCIUM 9.6  --   --   < > 8.8* 8.7*  MG 3.2*  --  2.9*  --   --   --   < > = values in this interval not displayed. Liver Function Tests: No results for input(s): AST, ALT, ALKPHOS, BILITOT, PROT, ALBUMIN in the last 72 hours. No results for input(s): LIPASE, AMYLASE in the last 72 hours. CBC:  Recent Labs  12/01/14 0400 12/02/14 0615  WBC 15.3* 15.2*  NEUTROABS  --  11.8*  HGB 8.6* 8.4*  HCT 27.4* 26.4*  MCV 91.3 91.3  PLT 105* 160   Cardiac Enzymes: No results for input(s): CKTOTAL, CKMB, CKMBINDEX, TROPONINI in the last 72 hours. BNP: Invalid input(s): POCBNP D-Dimer: No results for input(s): DDIMER in the last 72 hours. Hemoglobin A1C: No results for input(s): HGBA1C in the last 72 hours. Fasting Lipid Panel: No results for input(s): CHOL, HDL, LDLCALC, TRIG, CHOLHDL, LDLDIRECT in the last 72 hours. Thyroid Function Tests: No results for input(s): TSH, T4TOTAL, T3FREE, THYROIDAB in the last 72 hours.  Invalid input(s): FREET3 Anemia  Panel: No results for input(s): VITAMINB12, FOLATE, FERRITIN, TIBC, IRON, RETICCTPCT in the last 72 hours.  RADIOLOGY: X-ray Chest Pa And Lateral  11/23/2014  CLINICAL DATA:  Congestive heart failure, cough. EXAM: CHEST  2 VIEW COMPARISON:  06/25/2014 FINDINGS: Dual lead cardiac pacemaker with battery apparatus within the right chest wall is stable. Cardiomediastinal silhouette is enlarged. Mediastinal contours appear intact. There is no evidence of focal airspace consolidation, pleural effusion or pneumothorax. Osseous structures are without acute abnormality. Lower cervical spine fusion is noted. Soft tissues are grossly normal. IMPRESSION: Enlarged cardiac silhouette, otherwise no evidence of acute cardiopulmonary process. Electronically Signed   By: Fidela Salisbury M.D.   On: 11/23/2014 18:49   Ir Veno/ext/bi  12/01/2014  CLINICAL DATA:  Pacemaker lead failure, evaluate for central venous significant stenosis or occlusion. Patient needs a revision with lead extraction. EXAM: ULTRASOUND FOR VASCULAR ACCESS BILATERAL UPPER EXTREMITY AND CENTRAL SVC VENOGRAMS Date:  10/21/201610/21/2016 11:54 am Radiologist:  M. Daryll Brod, MD Guidance:  Ultrasound and fluoroscopic FLUOROSCOPY TIME:  30 seconds, 153 mGy MEDICATIONS AND MEDICAL HISTORY: 1% lidocaine locally ANESTHESIA/SEDATION: None. CONTRAST:  50 cc Omnipaque [id number] COMPLICATIONS: None immediate PROCEDURE: Informed consent was obtained from the patient following explanation of the procedure, risks, benefits and alternatives. The patient understands, agrees and consents for the procedure. All questions were addressed. A time out was performed. Maximal barrier sterile technique utilized including caps, mask, sterile gowns, sterile gloves, large sterile drape, hand hygiene, and ChloraPrep. under sterile conditions and local anesthesia, ultrasound micropuncture access performed of the right brachial vein. Guidewire access advanced followed by the 4 French  micro dilator. This was secured for injection purposes. In a similar fashion, the left upper extremity was sterilely prepped and draped. Under sterile conditions and local anesthesia, a brachial vein was punctured with a micropuncture needle. Guidewire advanced followed by a 4 [place] dilator. This was secured for contrast injection to perform venography. Right upper extremity: Right brachial, axillary, subclavian, and innominate veins are patent. No evidence of stenosis or occlusion. Minimal narrowing of the right innominate vein were the pacer leads insert. 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 Left axillary, subclavian, and innominate veins are patent. Negative for thrombus, occlusion or significant stenosis. Central SVC is patent. IMPRESSION: Patent upper extremity venograms and central veins. No significant  central stenosis, thrombus or occlusion. Electronically Signed   By: Jerilynn Mages.  Shick M.D.   On: 12/01/2014 12:10   Ir Venocavagram Svc  12/01/2014  CLINICAL DATA:  Pacemaker lead failure, evaluate for central venous significant stenosis or occlusion. Patient needs a revision with lead extraction. EXAM: ULTRASOUND FOR VASCULAR ACCESS BILATERAL UPPER EXTREMITY AND CENTRAL SVC VENOGRAMS Date:  10/21/201610/21/2016 11:54 am Radiologist:  M. Daryll Brod, MD Guidance:  Ultrasound and fluoroscopic FLUOROSCOPY TIME:  30 seconds, 153 mGy MEDICATIONS AND MEDICAL HISTORY: 1% lidocaine locally ANESTHESIA/SEDATION: None. CONTRAST:  50 cc Omnipaque [id number] COMPLICATIONS: None immediate PROCEDURE: Informed consent was obtained from the patient following explanation of the procedure, risks, benefits and alternatives. The patient understands, agrees and consents for the procedure. All questions were addressed. A time out was performed. Maximal barrier sterile technique utilized including caps, mask, sterile gowns, sterile gloves, large sterile drape, hand hygiene, and ChloraPrep. under sterile conditions  and local anesthesia, ultrasound micropuncture access performed of the right brachial vein. Guidewire access advanced followed by the 4 French micro dilator. This was secured for injection purposes. In a similar fashion, the left upper extremity was sterilely prepped and draped. Under sterile conditions and local anesthesia, a brachial vein was punctured with a micropuncture needle. Guidewire advanced followed by a 4 [place] dilator. This was secured for contrast injection to perform venography. Right upper extremity: Right brachial, axillary, subclavian, and innominate veins are patent. No evidence of stenosis or occlusion. Minimal narrowing of the right innominate vein were the pacer leads insert. 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 Left axillary, subclavian, and innominate veins are patent. Negative for thrombus, occlusion or significant stenosis. Central SVC is patent. IMPRESSION: Patent upper extremity venograms and central veins. No significant central stenosis, thrombus or occlusion. Electronically Signed   By: Jerilynn Mages.  Shick M.D.   On: 12/01/2014 12:10   Ir US Guide Vasc Access Left  12/01/2014  CLINICAL DATA:  Pacemaker lead failure, evaluate for central venous significant stenosis or occlusion. Patient needs a revision with lead extraction. EXAM: ULTRASOUND FOR VASCULAR ACCESS BILATERAL UPPER EXTREMITY AND CENTRAL SVC VENOGRAMS Date:  10/21/201610/21/2016 11:54 am Radiologist:  M. Daryll Brod, MD Guidance:  Ultrasound and fluoroscopic FLUOROSCOPY TIME:  30 seconds, 153 mGy MEDICATIONS AND MEDICAL HISTORY: 1% lidocaine locally ANESTHESIA/SEDATION: None. CONTRAST:  50 cc Omnipaque [id number] COMPLICATIONS: None immediate PROCEDURE: Informed consent was obtained from the patient following explanation of the procedure, risks, benefits and alternatives. The patient understands, agrees and consents for the procedure. All questions were addressed. A time out was performed. Maximal barrier  sterile technique utilized including caps, mask, sterile gowns, sterile gloves, large sterile drape, hand hygiene, and ChloraPrep. under sterile conditions and local anesthesia, ultrasound micropuncture access performed of the right brachial vein. Guidewire access advanced followed by the 4 French micro dilator. This was secured for injection purposes. In a similar fashion, the left upper extremity was sterilely prepped and draped. Under sterile conditions and local anesthesia, a brachial vein was punctured with a micropuncture needle. Guidewire advanced followed by a 4 [place] dilator. This was secured for contrast injection to perform venography. Right upper extremity: Right brachial, axillary, subclavian, and innominate veins are patent. No evidence of stenosis or occlusion. Minimal narrowing of the right innominate vein were the pacer leads insert. 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 Left axillary, subclavian, and innominate veins are patent. Negative for thrombus, occlusion or significant stenosis. Central SVC is patent. IMPRESSION: Patent upper extremity venograms and central  veins. No significant central stenosis, thrombus or occlusion. Electronically Signed   By: Jerilynn Mages.  Shick M.D.   On: 12/01/2014 12:10   Ir US Guide Vasc Access Right  12/01/2014  CLINICAL DATA:  Pacemaker lead failure, evaluate for central venous significant stenosis or occlusion. Patient needs a revision with lead extraction. EXAM: ULTRASOUND FOR VASCULAR ACCESS BILATERAL UPPER EXTREMITY AND CENTRAL SVC VENOGRAMS Date:  10/21/201610/21/2016 11:54 am Radiologist:  M. Daryll Brod, MD Guidance:  Ultrasound and fluoroscopic FLUOROSCOPY TIME:  30 seconds, 153 mGy MEDICATIONS AND MEDICAL HISTORY: 1% lidocaine locally ANESTHESIA/SEDATION: None. CONTRAST:  50 cc Omnipaque [id number] COMPLICATIONS: None immediate PROCEDURE: Informed consent was obtained from the patient following explanation of the procedure, risks, benefits  and alternatives. The patient understands, agrees and consents for the procedure. All questions were addressed. A time out was performed. Maximal barrier sterile technique utilized including caps, mask, sterile gowns, sterile gloves, large sterile drape, hand hygiene, and ChloraPrep. under sterile conditions and local anesthesia, ultrasound micropuncture access performed of the right brachial vein. Guidewire access advanced followed by the 4 French micro dilator. This was secured for injection purposes. In a similar fashion, the left upper extremity was sterilely prepped and draped. Under sterile conditions and local anesthesia, a brachial vein was punctured with a micropuncture needle. Guidewire advanced followed by a 4 [place] dilator. This was secured for contrast injection to perform venography. Right upper extremity: Right brachial, axillary, subclavian, and innominate veins are patent. No evidence of stenosis or occlusion. Minimal narrowing of the right innominate vein were the pacer leads insert. 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 Left axillary, subclavian, and innominate veins are patent. Negative for thrombus, occlusion or significant stenosis. Central SVC is patent. IMPRESSION: Patent upper extremity venograms and central veins. No significant central stenosis, thrombus or occlusion. Electronically Signed   By: Jerilynn Mages.  Shick M.D.   On: 12/01/2014 12:10   Dg Chest Port 1 View  12/01/2014  CLINICAL DATA:  CABG. EXAM: PORTABLE CHEST 1 VIEW COMPARISON:  11/30/2014. FINDINGS: Interim removal of endotracheal tube, NG tube, Swan-Ganz catheter, mediastinal drainage catheter, left chest tube. Cardiac pacer in stable position prior CABG. Cardiomegaly. Persistent bibasilar atelectasis. Small left pleural effusion cannot be excluded P No pneumothorax. Prior cervical spine fusion . IMPRESSION: 1. Interim removal of lines and tubes.  No pneumothorax. 2. Cardiac pacer in stable position . Prior  CABG. Heart size stable. 3. Low lung volumes with bibasilar atelectasis. Small left pleural effusion cannot be excluded . Electronically Signed   By: Marcello Moores  Register   On: 12/01/2014 08:00   Dg Chest Port 1 View  11/30/2014  CLINICAL DATA:  CABG. EXAM: PORTABLE CHEST 1 VIEW COMPARISON:  11/29/2014. FINDINGS: Endotracheal tube, NG tube, Swan-Ganz catheter, left chest tube in stable position. Cardiac pacer stable position. Prior CABG. Stable cardiomegaly. Interim improvement of bilateral pulmonary edema. Small left pleural effusion. No pneumothorax. IMPRESSION: 1. Lines and tubes in stable position. 2. Cardiac pacer in stable position. Prior CABG. Stable cardiomegaly. Interim improvement of bilateral pulmonary edema. Electronically Signed   By: [place]   On: 11/30/2014 08:12   Dg Chest Port 1 View  11/29/2014  CLINICAL DATA:  Bypass surgery. EXAM: PORTABLE CHEST 1 VIEW COMPARISON:  11/23/2014. FINDINGS: The endotracheal to is 3.2 cm above the carina. The right IJ Swan-Ganz catheter tip is in the proximal right pulmonary artery. An NG tube is coursing down into the stomach. Mediastinal and left-sided chest tube are in place. No definite pneumothorax. Cardiac  enlargement with perihilar pulmonary edema, areas of atelectasis and small left effusion. IMPRESSION: Postop support apparatus in good position without complicating features. Cardiac enlargement, perihilar pulmonary edema, areas of atelectasis and left pleural effusion. No pneumothorax. Electronically Signed   By: Marijo Sanes M.D.   On: 11/29/2014 16:00    ASSESSMENT AND PLAN:  Active Problems:   Acute diastolic HF (heart failure) (HCC)   CAD, multiple vessel   S/P CABG x 5 Heart failure with CRT: Had RV lead cut during surgery.  On interrogation of her device, her RV and LV lead are working well.  She does have 4 leads down, and it is likely that the cut lead was the extra RV lead.  Her A lead is not working correctly, and Kennetta Pavlovic likely  need to be replaced.  She as she has 4 leads through her SVC, it may be appropriate to extract leads so that she does not have 5 leads down.  She has had a venogram showing open vessels.  Jayren Cease plan on next week for lead revision as she is still in the ICU on pressors.    Jermeka Schlotterbeck Meredith Leeds, MD 12/02/2014 8:44 AM

## 2014-12-02 NOTE — Evaluation (Signed)
Physical Therapy Evaluation Patient Details Name: [name] MRN: [id number] DOB: [birth year] Today's Date: 12/02/2014   History of Present Illness  pt is a 69 y/o female, PMHx of CHF, pacer, sleep apnea and [place] admitted with progressive SOB and chest pain.  Per CATH 3-vessel ds, s/p CABG x5.  Clinical Impression  Pt admitted with/for CABG.  Pt currently limited functionally due to the problems listed. ( See problems list.)   Pt will benefit from PT to maximize function and safety in order to get ready for next venue listed below.      Follow Up Recommendations SNF;Supervision/Assistance - 24 hour    Equipment Recommendations  Other (comment) (TBA)    Recommendations for Other Services       Precautions / Restrictions Precautions Precautions: Fall;Sternal      Mobility  Bed Mobility Overal bed mobility: Needs Assistance Bed Mobility: Sit to Supine       Sit to supine: Mod assist      Transfers Overall transfer level: Needs assistance Equipment used: Rolling walker (2 wheeled) Transfers: Sit to/from Stand Sit to Stand: Min assist         General transfer comment: cued for sternal precautions  Ambulation/Gait Ambulation/Gait assistance: Min assist;+2 physical assistance;+2 safety/equipment Ambulation Distance (Feet): 180 Feet Assistive device: Rolling walker (2 wheeled) Gait Pattern/deviations: Step-through pattern Gait velocity: slow   General Gait Details: generally unsteady and weak-kneed as she fatigued  Stairs            Wheelchair Mobility    Modified Rankin (Stroke Patients Only)       Balance Overall balance assessment: Needs assistance Sitting-balance support: No upper extremity supported Sitting balance-Leahy Scale: Fair     Standing balance support: Bilateral upper extremity supported Standing balance-Leahy Scale: Fair Standing balance comment: reliant on RW                             Pertinent Vitals/Pain  Pain Assessment: Faces Faces Pain Scale: Hurts even more Pain Location: chest Pain Descriptors / Indicators: Sore Pain Intervention(s): Limited activity within patient's tolerance;Monitored during session    Home Living Family/patient expects to be discharged to:: Private residence Living Arrangements: Children;Other relatives Available Help at Discharge: Available PRN/intermittently;Family Type of Home: House Home Access: Stairs to enter Entrance Stairs-Rails: Psychiatric nurse of Steps: several Home Layout: Two level Home Equipment: Environmental consultant - 2 wheels      Prior Function Level of Independence: Independent               Hand Dominance        Extremity/Trunk Assessment   Upper Extremity Assessment: Generalized weakness           Lower Extremity Assessment: Generalized weakness         Communication   Communication: No difficulties  Cognition Arousal/Alertness: Awake/alert Behavior During Therapy: WFL for tasks assessed/performed Overall Cognitive Status: Within Functional Limits for tasks assessed                      General Comments General comments (skin integrity, edema, etc.): EHR 100's-120's  Sats on 3L [facility]  98%    Exercises        Assessment/Plan    PT Assessment Patient needs continued PT services  PT Diagnosis Difficulty walking;Generalized weakness   PT Problem List Decreased strength;Decreased activity tolerance;Decreased balance;Decreased mobility;Pain;Cardiopulmonary status limiting activity  PT Treatment Interventions Gait training;DME instruction;Functional mobility training;Therapeutic  activities;Balance training;Patient/family education   PT Goals (Current goals can be found in the Care Plan section) Acute Rehab PT Goals Patient Stated Goal: Eventually home independent PT Goal Formulation: With patient Time For Goal Achievement: 12/02/14 Potential to Achieve Goals: Good    Frequency Min 3X/week    Barriers to discharge Decreased caregiver support      Co-evaluation               End of Session   Activity Tolerance: Patient tolerated treatment well;Patient limited by fatigue Patient left: in bed;with call bell/phone within reach;with nursing/sitter in room Nurse Communication: Mobility status;Precautions         Time: [id number] PT Time Calculation (min) (ACUTE ONLY): 30 min   Charges:   PT Evaluation $Initial PT Evaluation Tier I: 1 Procedure PT Treatments $Gait Training: 8-22 mins   PT G Codes:        Loyalty Arentz, Tessie Fass 12/02/2014, 3:24 PM 12/02/2014  Donnella Sham, PT [phone]  (pager)

## 2014-12-02 NOTE — Progress Notes (Signed)
Patient ID: [name], female   DOB: 1945-08-16, 69 y.o.   MRN: [id number] [facility] DAILY ICU PROGRESS NOTE                   [address]            [place] [address]          [phone]   3 Days Post-Op Procedure(s) (LRB): CORONARY ARTERY BYPASS GRAFTING x 5 (LIMA-LAD, SVG-D, SVG-OM1-OM2, SVG-PD) (N/A) TRANSESOPHAGEAL ECHOCARDIOGRAM (TEE) (N/A)  Total Length of Stay:  LOS: 9 days   Subjective: Up in chair this am, still on neo but weaning down  Objective: Vital signs in last 24 hours: Temp:  [97.7 F (36.5 C)-99.6 F (37.6 C)] 98 F (36.7 C) (10/22 0802) Pulse Rate:  [88-119] 106 (10/22 0900) Cardiac Rhythm:  [-] Ventricular paced (10/22 0900) Resp:  [16-29] 27 (10/22 0900) BP: (82-125)/(38-69) 90/68 mmHg (10/22 0900) SpO2:  [84 %-100 %] 100 % (10/22 0900) Weight:  [253 lb 1.4 oz (114.8 kg)] 253 lb 1.4 oz (114.8 kg) (10/22 0600)  Filed Weights   11/30/14 0500 12/01/14 0600 12/02/14 0600  Weight: 266 lb 12.1 oz (121 kg) 258 lb 2.5 oz (117.1 kg) 253 lb 1.4 oz (114.8 kg)    Weight change: -5 lb 1.1 oz (-2.3 kg)   Hemodynamic parameters for last 24 hours:    Intake/Output from previous day: 10/21 0701 - 10/22 0700 In: 1387.7 [P.O.:250; I.V.:1137.7] Out: 3275 [Urine:3275]  Intake/Output this shift: Total I/O In: 185 [I.V.:185] Out: 125 [Urine:125]  Current Meds: Scheduled Meds: . acetaminophen  1,000 mg Oral 4 times per day   Or  . acetaminophen (TYLENOL) oral liquid 160 mg/5 mL  1,000 mg Per Tube 4 times per day  . antiseptic oral rinse  7 mL Mouth Rinse QID  . aspirin EC  325 mg Oral Daily   Or  . aspirin  324 mg Per Tube Daily  . bisacodyl  10 mg Oral Daily   Or  . bisacodyl  10 mg Rectal Daily  . budesonide-formoterol  2 puff Inhalation BID  . chlorhexidine gluconate  15 mL Mouth Rinse BID  . docusate sodium  200 mg Oral Daily  . enoxaparin (LOVENOX) injection  40 mg Subcutaneous QHS  . insulin aspart  0-24 Units Subcutaneous 6  times per day  . insulin aspart  4 Units Subcutaneous TID WC  . insulin detemir  25 Units Subcutaneous BID  . insulin regular  0-10 Units Intravenous TID WC  . levalbuterol  0.63 mg Nebulization QID  . pantoprazole  40 mg Oral Daily  . pravastatin  80 mg Oral QPM  . predniSONE  50 mg Oral Q breakfast  . sodium chloride  3 mL Intravenous Q12H   Continuous Infusions: . sodium chloride Stopped (12/01/14 1900)  . sodium chloride Stopped (12/01/14 1900)  . sodium chloride Stopped (12/01/14 1900)  . insulin (NOVOLIN-R) infusion 3 Units/hr (11/30/14 0600)  . lactated ringers Stopped (11/29/14 1530)  . lactated ringers 20 mL/hr at 12/02/14 1000  . phenylephrine (NEO-SYNEPHRINE) Adult infusion 20 mcg/min (12/02/14 1000)   PRN Meds:.sodium chloride, dicyclomine, iohexol, loperamide, morphine injection, ondansetron (ZOFRAN) IV, oxyCODONE, potassium chloride, sodium chloride, traMADol  General appearance: alert, cooperative and no distress Neurologic: intact Heart: regular rate and rhythm, S1, S2 normal, no murmur, click, rub or gallop Lungs: diminished breath sounds bibasilar Abdomen: soft, non-tender; bowel sounds normal; no masses,  no organomegaly Extremities: extremities normal, atraumatic, no cyanosis  or edema and Homans sign is negative, no sign of DVT Wound: sternum intact  Lab Results: CBC: Recent Labs  12/01/14 0400 12/02/14 0615  WBC 15.3* 15.2*  HGB 8.6* 8.4*  HCT 27.4* 26.4*  PLT 105* 160   BMET:  Recent Labs  12/01/14 1723 12/02/14 0627  NA 140 140  K 3.9 3.4*  CL 102 98*  CO2 31 29  GLUCOSE 124* 94  BUN 31* 32*  CREATININE 1.44* 1.29*  CALCIUM 8.8* 8.7*    PT/INR:  Recent Labs  11/29/14 1554  LABPROT 16.4*  INR 1.30   Radiology: Ir Veno/ext/bi  12/01/2014  CLINICAL DATA:  Pacemaker lead failure, evaluate for central venous significant stenosis or occlusion. Patient needs a revision with lead extraction. EXAM: ULTRASOUND FOR VASCULAR ACCESS BILATERAL  UPPER EXTREMITY AND CENTRAL SVC VENOGRAMS Date:  10/21/201610/21/2016 11:54 am Radiologist:  M. Daryll Brod, MD Guidance:  Ultrasound and fluoroscopic FLUOROSCOPY TIME:  30 seconds, 153 mGy MEDICATIONS AND MEDICAL HISTORY: 1% lidocaine locally ANESTHESIA/SEDATION: None. CONTRAST:  50 cc Omnipaque [id number] COMPLICATIONS: None immediate PROCEDURE: Informed consent was obtained from the patient following explanation of the procedure, risks, benefits and alternatives. The patient understands, agrees and consents for the procedure. All questions were addressed. A time out was performed. Maximal barrier sterile technique utilized including caps, mask, sterile gowns, sterile gloves, large sterile drape, hand hygiene, and ChloraPrep. under sterile conditions and local anesthesia, ultrasound micropuncture access performed of the right brachial vein. Guidewire access advanced followed by the 4 French micro dilator. This was secured for injection purposes. In a similar fashion, the left upper extremity was sterilely prepped and draped. Under sterile conditions and local anesthesia, a brachial vein was punctured with a micropuncture needle. Guidewire advanced followed by a 4 [place] dilator. This was secured for contrast injection to perform venography. Right upper extremity: Right brachial, axillary, subclavian, and innominate veins are patent. No evidence of stenosis or occlusion. Minimal narrowing of the right innominate vein were the pacer leads insert. 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 Left axillary, subclavian, and innominate veins are patent. Negative for thrombus, occlusion or significant stenosis. Central SVC is patent. IMPRESSION: Patent upper extremity venograms and central veins. No significant central stenosis, thrombus or occlusion. Electronically Signed   By: Jerilynn Mages.  Shick M.D.   On: 12/01/2014 12:10   Ir Venocavagram Svc  12/01/2014  CLINICAL DATA:  Pacemaker lead failure, evaluate for  central venous significant stenosis or occlusion. Patient needs a revision with lead extraction. EXAM: ULTRASOUND FOR VASCULAR ACCESS BILATERAL UPPER EXTREMITY AND CENTRAL SVC VENOGRAMS Date:  10/21/201610/21/2016 11:54 am Radiologist:  M. Daryll Brod, MD Guidance:  Ultrasound and fluoroscopic FLUOROSCOPY TIME:  30 seconds, 153 mGy MEDICATIONS AND MEDICAL HISTORY: 1% lidocaine locally ANESTHESIA/SEDATION: None. CONTRAST:  50 cc Omnipaque [id number] COMPLICATIONS: None immediate PROCEDURE: Informed consent was obtained from the patient following explanation of the procedure, risks, benefits and alternatives. The patient understands, agrees and consents for the procedure. All questions were addressed. A time out was performed. Maximal barrier sterile technique utilized including caps, mask, sterile gowns, sterile gloves, large sterile drape, hand hygiene, and ChloraPrep. under sterile conditions and local anesthesia, ultrasound micropuncture access performed of the right brachial vein. Guidewire access advanced followed by the 4 French micro dilator. This was secured for injection purposes. In a similar fashion, the left upper extremity was sterilely prepped and draped. Under sterile conditions and local anesthesia, a brachial vein was punctured with a micropuncture needle. Guidewire advanced followed by  a 4 [place] dilator. This was secured for contrast injection to perform venography. Right upper extremity: Right brachial, axillary, subclavian, and innominate veins are patent. No evidence of stenosis or occlusion. Minimal narrowing of the right innominate vein were the pacer leads insert. 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 Left axillary, subclavian, and innominate veins are patent. Negative for thrombus, occlusion or significant stenosis. Central SVC is patent. IMPRESSION: Patent upper extremity venograms and central veins. No significant central stenosis, thrombus or occlusion. Electronically  Signed   By: Jerilynn Mages.  Shick M.D.   On: 12/01/2014 12:10   Ir US Guide Vasc Access Left  12/01/2014  CLINICAL DATA:  Pacemaker lead failure, evaluate for central venous significant stenosis or occlusion. Patient needs a revision with lead extraction. EXAM: ULTRASOUND FOR VASCULAR ACCESS BILATERAL UPPER EXTREMITY AND CENTRAL SVC VENOGRAMS Date:  10/21/201610/21/2016 11:54 am Radiologist:  M. Daryll Brod, MD Guidance:  Ultrasound and fluoroscopic FLUOROSCOPY TIME:  30 seconds, 153 mGy MEDICATIONS AND MEDICAL HISTORY: 1% lidocaine locally ANESTHESIA/SEDATION: None. CONTRAST:  50 cc Omnipaque [id number] COMPLICATIONS: None immediate PROCEDURE: Informed consent was obtained from the patient following explanation of the procedure, risks, benefits and alternatives. The patient understands, agrees and consents for the procedure. All questions were addressed. A time out was performed. Maximal barrier sterile technique utilized including caps, mask, sterile gowns, sterile gloves, large sterile drape, hand hygiene, and ChloraPrep. under sterile conditions and local anesthesia, ultrasound micropuncture access performed of the right brachial vein. Guidewire access advanced followed by the 4 French micro dilator. This was secured for injection purposes. In a similar fashion, the left upper extremity was sterilely prepped and draped. Under sterile conditions and local anesthesia, a brachial vein was punctured with a micropuncture needle. Guidewire advanced followed by a 4 [place] dilator. This was secured for contrast injection to perform venography. Right upper extremity: Right brachial, axillary, subclavian, and innominate veins are patent. No evidence of stenosis or occlusion. Minimal narrowing of the right innominate vein were the pacer leads insert. 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 Left axillary, subclavian, and innominate veins are patent. Negative for thrombus, occlusion or significant stenosis. Central  SVC is patent. IMPRESSION: Patent upper extremity venograms and central veins. No significant central stenosis, thrombus or occlusion. Electronically Signed   By: Jerilynn Mages.  Shick M.D.   On: 12/01/2014 12:10   Ir US Guide Vasc Access Right  12/01/2014  CLINICAL DATA:  Pacemaker lead failure, evaluate for central venous significant stenosis or occlusion. Patient needs a revision with lead extraction. EXAM: ULTRASOUND FOR VASCULAR ACCESS BILATERAL UPPER EXTREMITY AND CENTRAL SVC VENOGRAMS Date:  10/21/201610/21/2016 11:54 am Radiologist:  M. Daryll Brod, MD Guidance:  Ultrasound and fluoroscopic FLUOROSCOPY TIME:  30 seconds, 153 mGy MEDICATIONS AND MEDICAL HISTORY: 1% lidocaine locally ANESTHESIA/SEDATION: None. CONTRAST:  50 cc Omnipaque [id number] COMPLICATIONS: None immediate PROCEDURE: Informed consent was obtained from the patient following explanation of the procedure, risks, benefits and alternatives. The patient understands, agrees and consents for the procedure. All questions were addressed. A time out was performed. Maximal barrier sterile technique utilized including caps, mask, sterile gowns, sterile gloves, large sterile drape, hand hygiene, and ChloraPrep. under sterile conditions and local anesthesia, ultrasound micropuncture access performed of the right brachial vein. Guidewire access advanced followed by the 4 French micro dilator. This was secured for injection purposes. In a similar fashion, the left upper extremity was sterilely prepped and draped. Under sterile conditions and local anesthesia, a brachial vein was punctured with a  micropuncture needle. Guidewire advanced followed by a 4 [place] dilator. This was secured for contrast injection to perform venography. Right upper extremity: Right brachial, axillary, subclavian, and innominate veins are patent. No evidence of stenosis or occlusion. Minimal narrowing of the right innominate vein were the pacer leads insert. Left upper extremity: Left brachial  vein is likely duplicated. Left axillary, subclavian, and innominate veins are patent. Negative for thrombus, occlusion or significant stenosis. Central SVC is patent. IMPRESSION: Patent upper extremity venograms and central veins. No significant central stenosis, thrombus or occlusion. Electronically Signed   By: Jerilynn Mages.  Shick M.D.   On: 12/01/2014 12:10     Assessment/Plan: S/P Procedure(s) (LRB): CORONARY ARTERY BYPASS GRAFTING x 5 (LIMA-LAD, SVG-D, SVG-OM1-OM2, SVG-PD) (N/A) TRANSESOPHAGEAL ECHOCARDIOGRAM (TEE) (N/A) Mobilize Diuresis Diabetes control D/c foley Will need ? Lead extraction revision of atrial lead   Weaning neo off   Grace Isaac 12/02/2014 10:24 AM

## 2014-12-02 NOTE — Progress Notes (Signed)
Patient ID: [name], female   DOB: 03/20/1945, 69 y.o.   MRN: [id number] EVENING ROUNDS NOTE :     [address]       [place] [address]             [phone]                 3 Days Post-Op Procedure(s) (LRB): CORONARY ARTERY BYPASS GRAFTING x 5 (LIMA-LAD, SVG-D, SVG-OM1-OM2, SVG-PD) (N/A) TRANSESOPHAGEAL ECHOCARDIOGRAM (TEE) (N/A)  Total Length of Stay:  LOS: 9 days  BP 97/51 mmHg  Pulse 115  Temp(Src) 98.2 F (36.8 C) (Oral)  Resp 24  Ht 5\' 5"  (1.651 m)  Wt 253 lb 1.4 oz (114.8 kg)  BMI 42.12 kg/m2  SpO2 100%  .Intake/Output      10/22 0701 - 10/23 0700   P.O. 200   I.V. (mL/kg) 425 (3.7)   Total Intake(mL/kg) 625 (5.4)   Urine (mL/kg/hr) 775 (0.6)   Total Output 775   Net -150         . sodium chloride Stopped (12/01/14 1900)  . sodium chloride Stopped (12/01/14 1900)  . sodium chloride Stopped (12/01/14 1900)  . insulin (NOVOLIN-R) infusion Stopped (12/02/14 1838)  . lactated ringers Stopped (11/29/14 1530)  . lactated ringers 10 mL/hr at 12/02/14 1800  . phenylephrine (NEO-SYNEPHRINE) Adult infusion Stopped (12/02/14 1838)     Lab Results  Component Value Date   WBC 15.2* 12/02/2014   HGB 8.4* 12/02/2014   HCT 26.4* 12/02/2014   PLT 160 12/02/2014   GLUCOSE 94 12/02/2014   CHOL 189 11/24/2014   TRIG 104 11/24/2014   HDL 51 11/24/2014   LDLCALC 117* 11/24/2014   ALT 27 11/23/2014   AST 24 11/23/2014   NA 140 12/02/2014   K 3.4* 12/02/2014   CL 98* 12/02/2014   CREATININE 1.29* 12/02/2014   BUN 32* 12/02/2014   CO2 29 12/02/2014   TSH 2.505 11/23/2014   INR 1.30 11/29/2014   HGBA1C 6.7* 11/23/2014   Off neo Walked in hall Diuresis Lactulose due to constipation   Grace Isaac MD  Beeper [phone] Office [phone] 12/02/2014 7:11 PM

## 2014-12-03 ENCOUNTER — Inpatient Hospital Stay (HOSPITAL_COMMUNITY): Payer: Medicare Other

## 2014-12-03 DIAGNOSIS — G4733 Obstructive sleep apnea (adult) (pediatric): Secondary | ICD-10-CM

## 2014-12-03 LAB — GLUCOSE, CAPILLARY
Glucose-Capillary: 110 mg/dL — ABNORMAL HIGH (ref 65–99)
Glucose-Capillary: 118 mg/dL — ABNORMAL HIGH (ref 65–99)
Glucose-Capillary: 141 mg/dL — ABNORMAL HIGH (ref 65–99)
Glucose-Capillary: 56 mg/dL — ABNORMAL LOW (ref 65–99)
Glucose-Capillary: 61 mg/dL — ABNORMAL LOW (ref 65–99)
Glucose-Capillary: 63 mg/dL — ABNORMAL LOW (ref 65–99)
Glucose-Capillary: 69 mg/dL (ref 65–99)
Glucose-Capillary: 85 mg/dL (ref 65–99)
Glucose-Capillary: 99 mg/dL (ref 65–99)

## 2014-12-03 LAB — POCT I-STAT 3, ART BLOOD GAS (G3+)
Acid-Base Excess: 8 mmol/L — ABNORMAL HIGH (ref 0.0–2.0)
Bicarbonate: 32 mEq/L — ABNORMAL HIGH (ref 20.0–24.0)
O2 Saturation: 94 %
Patient temperature: 98
TCO2: 33 mmol/L (ref 0–100)
pCO2 arterial: 40.4 mmHg (ref 35.0–45.0)
pH, Arterial: 7.506 — ABNORMAL HIGH (ref 7.350–7.450)
pO2, Arterial: 63 mmHg — ABNORMAL LOW (ref 80.0–100.0)

## 2014-12-03 LAB — COMPREHENSIVE METABOLIC PANEL
ALT: 125 U/L — ABNORMAL HIGH (ref 14–54)
AST: 78 U/L — ABNORMAL HIGH (ref 15–41)
Albumin: 2.7 g/dL — ABNORMAL LOW (ref 3.5–5.0)
Alkaline Phosphatase: 172 U/L — ABNORMAL HIGH (ref 38–126)
Anion gap: 11 (ref 5–15)
BUN: 36 mg/dL — ABNORMAL HIGH (ref 6–20)
CO2: 31 mmol/L (ref 22–32)
Calcium: 8.5 mg/dL — ABNORMAL LOW (ref 8.9–10.3)
Chloride: 95 mmol/L — ABNORMAL LOW (ref 101–111)
Creatinine, Ser: 1.1 mg/dL — ABNORMAL HIGH (ref 0.44–1.00)
GFR calc Af Amer: 58 mL/min — ABNORMAL LOW (ref >60)
GFR calc non Af Amer: 50 mL/min — ABNORMAL LOW (ref >60)
Glucose, Bld: 107 mg/dL — ABNORMAL HIGH (ref 65–99)
Potassium: 3.7 mmol/L (ref 3.5–5.1)
Sodium: 137 mmol/L (ref 135–145)
Total Bilirubin: 1.3 mg/dL — ABNORMAL HIGH (ref 0.3–1.2)
Total Protein: 5.7 g/dL — ABNORMAL LOW (ref 6.5–8.1)

## 2014-12-03 LAB — CBC
HCT: 27.7 % — ABNORMAL LOW (ref 36.0–46.0)
Hemoglobin: 8.6 g/dL — ABNORMAL LOW (ref 12.0–15.0)
MCH: 27.9 pg (ref 26.0–34.0)
MCHC: 31 g/dL (ref 30.0–36.0)
MCV: 89.9 fL (ref 78.0–100.0)
Platelets: 219 10*3/uL (ref 150–400)
RBC: 3.08 MIL/uL — ABNORMAL LOW (ref 3.87–5.11)
RDW: 14.6 % (ref 11.5–15.5)
WBC: 11.8 10*3/uL — ABNORMAL HIGH (ref 4.0–10.5)

## 2014-12-03 MED ORDER — SODIUM CHLORIDE 0.9 % IJ SOLN: 10.0000 mL | INTRAMUSCULAR | Status: DC | PRN

## 2014-12-03 MED ORDER — SODIUM CHLORIDE 0.9 % IJ SOLN
10.0000 mL | Freq: Two times a day (BID) | INTRAMUSCULAR | Status: DC
  Administered 2014-12-03 – 2014-12-05 (×4): 10 mL via INTRAVENOUS

## 2014-12-03 MED ORDER — POTASSIUM CHLORIDE 10 MEQ/50ML IV SOLN
10.0000 meq | INTRAVENOUS | Status: AC | PRN
  Administered 2014-12-03 (×3): 10 meq via INTRAVENOUS

## 2014-12-03 MED ORDER — INSULIN DETEMIR 100 UNIT/ML ~~LOC~~ SOLN
15.0000 [IU] | Freq: Every day | SUBCUTANEOUS | Status: DC
  Administered 2014-12-03 – 2014-12-07 (×5): 15 [IU] via SUBCUTANEOUS
  Filled 2014-12-03 (×5): qty 0.15

## 2014-12-03 MED ORDER — FUROSEMIDE 10 MG/ML IJ SOLN
40.0000 mg | Freq: Once | INTRAMUSCULAR | Status: AC
  Administered 2014-12-03: 40 mg via INTRAVENOUS
  Filled 2014-12-03: qty 4

## 2014-12-03 NOTE — Progress Notes (Signed)
Subjective:  Breathing better; off pressors  Objective:   Vital Signs : Filed Vitals:   12/03/14 0400 12/03/14 0500 12/03/14 0600 12/03/14 0700  BP: 142/73 126/61 99/52 93/56  Pulse: 101 98 101 104  Temp:   98.9 F (37.2 C) 98 F (36.7 C)  TempSrc:   Oral Oral  Resp: _0 Height:      Weight:   249 lb 5.4 oz (113.1 kg)   SpO2: 99% 97% 96% 100%    Intake/Output from previous day:  Intake/Output Summary (Last 24 hours) at 12/03/14 1027 Last data filed at 12/03/14 0900  Gross per 24 hour  Intake 754.58 ml  Output   1200 ml  Net -445.42 ml    I/O since admission: -8256  Wt Readings from Last 3 Encounters:  12/03/14 249 lb 5.4 oz (113.1 kg)  11/23/14 249 lb 6.4 oz (113.127 kg)  10/18/14 246 lb (111.585 kg)   Pre-op wt 242; wt today 249, lost 17 lbs since peak post-op wt at 166  Medications: . acetaminophen  1,000 mg Oral 4 times per day   Or  . acetaminophen (TYLENOL) oral liquid 160 mg/5 mL  1,000 mg Per Tube 4 times per day  . antiseptic oral rinse  7 mL Mouth Rinse QID  . aspirin EC  325 mg Oral Daily   Or  . aspirin  324 mg Per Tube Daily  . bisacodyl  10 mg Oral Daily   Or  . bisacodyl  10 mg Rectal Daily  . budesonide-formoterol  2 puff Inhalation BID  . chlorhexidine gluconate  15 mL Mouth Rinse BID  . docusate sodium  200 mg Oral Daily  . enoxaparin (LOVENOX) injection  40 mg Subcutaneous QHS  . insulin aspart  0-24 Units Subcutaneous 6 times per day  . insulin aspart  4 Units Subcutaneous TID WC  . insulin detemir  25 Units Subcutaneous BID  . levalbuterol  0.63 mg Nebulization QID  . pantoprazole  40 mg Oral Daily  . pravastatin  80 mg Oral QPM  . sodium chloride  3 mL Intravenous Q12H    . sodium chloride Stopped (12/01/14 1900)  . sodium chloride Stopped (12/01/14 1900)  . sodium chloride Stopped (12/01/14 1900)  . lactated ringers 10 mL/hr at 12/02/14 1800  . phenylephrine (NEO-SYNEPHRINE) Adult infusion 5 mcg/min (12/03/14 0700)      Physical Exam:   General appearance: alert, cooperative and breathing better Neck: no adenopathy, no JVD, supple, symmetrical, trachea midline and thyroid not enlarged, symmetric, no tenderness/mass/nodules Lungs: decreased BS; no wheezing Heart: regular rate and rhythm, no rub and 1/6 sem Abdomen: soft, non-tender; bowel sounds normal; no masses,  no organomegaly Extremities: mild tense LE edema Neurologic: Grossly normal  Telemetry: A paced 106   12/02/14 ECG (independently read by me):A sensed, V paced; BiV pacemaker  Lab Results:   Recent Labs  11/30/14 1754 12/01/14 0400 12/01/14 1723 12/02/14 0627  NA  --  141 140 140  K  --  4.6 3.9 3.4*  CL  --  103 102 98*  CO2  --  _1 GLUCOSE  --  151* 124* 94  BUN  --  28* 31* 32*  CREATININE 1.55* 1.43* 1.44* 1.29*  CALCIUM  --  9.0 8.8* 8.7*  MG 2.9*  --   --   --     Hepatic Function Latest Ref Rng 11/23/2014 07/07/2014 06/25/2014  Total Protein 6.5 - 8.1 g/dL 7.2 7.1  7.4  Albumin 3.5 - 5.0 g/dL 3.6 3.6 3.6  AST 15 - 41 U/L 24 25 35  ALT 14 - 54 U/L 27 27 42  Alk Phosphatase 38 - 126 U/L 86 81 103  Total Bilirubin 0.3 - 1.2 mg/dL 0.5 1.2 1.0     Recent Labs  11/30/14 1754 12/01/14 0400 12/02/14 0615  WBC 15.6* 15.3* 15.2*  NEUTROABS  --   --  11.8*  HGB 9.0* 8.6* 8.4*  HCT 29.1* 27.4* 26.4*  MCV 90.4 91.3 91.3  PLT 132* 105* 160    No results for input(s): TROPONINI in the last 72 hours.  Invalid input(s): CK, MB  Lab Results  Component Value Date   TSH 2.505 11/23/2014   No results for input(s): HGBA1C in the last 72 hours.  No results for input(s): PROT, ALBUMIN, AST, ALT, ALKPHOS, BILITOT, BILIDIR, IBILI in the last 72 hours. No results for input(s): INR in the last 72 hours. BNP (last 3 results)  Recent Labs  11/23/14 1744  BNP 68.0    ProBNP (last 3 results) No results for input(s): PROBNP in the last 8760 hours.   Lipid Panel     Component Value Date/Time   CHOL 189  11/24/2014 0432   TRIG 104 11/24/2014 0432   HDL 51 11/24/2014 0432   CHOLHDL 3.7 11/24/2014 0432   VLDL 21 11/24/2014 0432   LDLCALC 117* 11/24/2014 0432     Imaging:  Ir Veno/ext/bi  12/01/2014  CLINICAL DATA:  Pacemaker lead failure, evaluate for central venous significant stenosis or occlusion. Patient needs a revision with lead extraction. EXAM: ULTRASOUND FOR VASCULAR ACCESS BILATERAL UPPER EXTREMITY AND CENTRAL SVC VENOGRAMS Date:  10/21/201610/21/2016 11:54 am Radiologist:  M. Daryll Brod, MD Guidance:  Ultrasound and fluoroscopic FLUOROSCOPY TIME:  30 seconds, 153 mGy MEDICATIONS AND MEDICAL HISTORY: 1% lidocaine locally ANESTHESIA/SEDATION: None. CONTRAST:  50 cc Omnipaque [id number] COMPLICATIONS: None immediate PROCEDURE: Informed consent was obtained from the patient following explanation of the procedure, risks, benefits and alternatives. The patient understands, agrees and consents for the procedure. All questions were addressed. A time out was performed. Maximal barrier sterile technique utilized including caps, mask, sterile gowns, sterile gloves, large sterile drape, hand hygiene, and ChloraPrep. under sterile conditions and local anesthesia, ultrasound micropuncture access performed of the right brachial vein. Guidewire access advanced followed by the 4 French micro dilator. This was secured for injection purposes. In a similar fashion, the left upper extremity was sterilely prepped and draped. Under sterile conditions and local anesthesia, a brachial vein was punctured with a micropuncture needle. Guidewire advanced followed by a 4 [place] dilator. This was secured for contrast injection to perform venography. Right upper extremity: Right brachial, axillary, subclavian, and innominate veins are patent. No evidence of stenosis or occlusion. Minimal narrowing of the right innominate vein were the pacer leads insert. 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 Left axillary,  subclavian, and innominate veins are patent. Negative for thrombus, occlusion or significant stenosis. Central SVC is patent. IMPRESSION: Patent upper extremity venograms and central veins. No significant central stenosis, thrombus or occlusion. Electronically Signed   By: Jerilynn Mages.  Shick M.D.   On: 12/01/2014 12:10   Ir Venocavagram Svc  12/01/2014  CLINICAL DATA:  Pacemaker lead failure, evaluate for central venous significant stenosis or occlusion. Patient needs a revision with lead extraction. EXAM: ULTRASOUND FOR VASCULAR ACCESS BILATERAL UPPER EXTREMITY AND CENTRAL SVC VENOGRAMS Date:  10/21/201610/21/2016 11:54 am Radiologist:  M. Daryll Brod, MD Guidance:  Ultrasound and fluoroscopic FLUOROSCOPY TIME:  30 seconds, 153 mGy MEDICATIONS AND MEDICAL HISTORY: 1% lidocaine locally ANESTHESIA/SEDATION: None. CONTRAST:  50 cc Omnipaque [id number] COMPLICATIONS: None immediate PROCEDURE: Informed consent was obtained from the patient following explanation of the procedure, risks, benefits and alternatives. The patient understands, agrees and consents for the procedure. All questions were addressed. A time out was performed. Maximal barrier sterile technique utilized including caps, mask, sterile gowns, sterile gloves, large sterile drape, hand hygiene, and ChloraPrep. under sterile conditions and local anesthesia, ultrasound micropuncture access performed of the right brachial vein. Guidewire access advanced followed by the 4 French micro dilator. This was secured for injection purposes. In a similar fashion, the left upper extremity was sterilely prepped and draped. Under sterile conditions and local anesthesia, a brachial vein was punctured with a micropuncture needle. Guidewire advanced followed by a 4 [place] dilator. This was secured for contrast injection to perform venography. Right upper extremity: Right brachial, axillary, subclavian, and innominate veins are patent. No evidence of stenosis or occlusion. Minimal  narrowing of the right innominate vein were the pacer leads insert. 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 Left axillary, subclavian, and innominate veins are patent. Negative for thrombus, occlusion or significant stenosis. Central SVC is patent. IMPRESSION: Patent upper extremity venograms and central veins. No significant central stenosis, thrombus or occlusion. Electronically Signed   By: Jerilynn Mages.  Shick M.D.   On: 12/01/2014 12:10   Ir US Guide Vasc Access Left  12/01/2014  CLINICAL DATA:  Pacemaker lead failure, evaluate for central venous significant stenosis or occlusion. Patient needs a revision with lead extraction. EXAM: ULTRASOUND FOR VASCULAR ACCESS BILATERAL UPPER EXTREMITY AND CENTRAL SVC VENOGRAMS Date:  10/21/201610/21/2016 11:54 am Radiologist:  M. Daryll Brod, MD Guidance:  Ultrasound and fluoroscopic FLUOROSCOPY TIME:  30 seconds, 153 mGy MEDICATIONS AND MEDICAL HISTORY: 1% lidocaine locally ANESTHESIA/SEDATION: None. CONTRAST:  50 cc Omnipaque [id number] COMPLICATIONS: None immediate PROCEDURE: Informed consent was obtained from the patient following explanation of the procedure, risks, benefits and alternatives. The patient understands, agrees and consents for the procedure. All questions were addressed. A time out was performed. Maximal barrier sterile technique utilized including caps, mask, sterile gowns, sterile gloves, large sterile drape, hand hygiene, and ChloraPrep. under sterile conditions and local anesthesia, ultrasound micropuncture access performed of the right brachial vein. Guidewire access advanced followed by the 4 French micro dilator. This was secured for injection purposes. In a similar fashion, the left upper extremity was sterilely prepped and draped. Under sterile conditions and local anesthesia, a brachial vein was punctured with a micropuncture needle. Guidewire advanced followed by a 4 [place] dilator. This was secured for contrast injection to perform  venography. Right upper extremity: Right brachial, axillary, subclavian, and innominate veins are patent. No evidence of stenosis or occlusion. Minimal narrowing of the right innominate vein were the pacer leads insert. 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 Left axillary, subclavian, and innominate veins are patent. Negative for thrombus, occlusion or significant stenosis. Central SVC is patent. IMPRESSION: Patent upper extremity venograms and central veins. No significant central stenosis, thrombus or occlusion. Electronically Signed   By: Jerilynn Mages.  Shick M.D.   On: 12/01/2014 12:10   Ir US Guide Vasc Access Right  12/01/2014  CLINICAL DATA:  Pacemaker lead failure, evaluate for central venous significant stenosis or occlusion. Patient needs a revision with lead extraction. EXAM: ULTRASOUND FOR VASCULAR ACCESS BILATERAL UPPER EXTREMITY AND CENTRAL SVC VENOGRAMS Date:  10/21/201610/21/2016 11:54 am Radiologist:  Tamera Punt, MD Guidance:  Ultrasound and fluoroscopic FLUOROSCOPY TIME:  30 seconds, 153 mGy MEDICATIONS AND MEDICAL HISTORY: 1% lidocaine locally ANESTHESIA/SEDATION: None. CONTRAST:  50 cc Omnipaque [id number] COMPLICATIONS: None immediate PROCEDURE: Informed consent was obtained from the patient following explanation of the procedure, risks, benefits and alternatives. The patient understands, agrees and consents for the procedure. All questions were addressed. A time out was performed. Maximal barrier sterile technique utilized including caps, mask, sterile gowns, sterile gloves, large sterile drape, hand hygiene, and ChloraPrep. under sterile conditions and local anesthesia, ultrasound micropuncture access performed of the right brachial vein. Guidewire access advanced followed by the 4 French micro dilator. This was secured for injection purposes. In a similar fashion, the left upper extremity was sterilely prepped and draped. Under sterile conditions and local anesthesia, a  brachial vein was punctured with a micropuncture needle. Guidewire advanced followed by a 4 [place] dilator. This was secured for contrast injection to perform venography. Right upper extremity: Right brachial, axillary, subclavian, and innominate veins are patent. No evidence of stenosis or occlusion. Minimal narrowing of the right innominate vein were the pacer leads insert. 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 Left axillary, subclavian, and innominate veins are patent. Negative for thrombus, occlusion or significant stenosis. Central SVC is patent. IMPRESSION: Patent upper extremity venograms and central veins. No significant central stenosis, thrombus or occlusion. Electronically Signed   By: Jerilynn Mages.  Shick M.D.   On: 12/01/2014 12:10   Dg Chest Port 1 View  12/03/2014  CLINICAL DATA:  Post CABG 11/29/2014, mid sternal chest pain and shortness of breath, hypertension, asthma, diabetes mellitus EXAM: PORTABLE CHEST 1 VIEW COMPARISON:  Portable exam 0558 hours compared to 12/01/2014 FINDINGS: RIGHT subclavian pacemaker leads project over RIGHT atrium and RIGHT ventricle. RIGHT jugular central venous catheter unchanged tip projecting over confluence of SVC. Enlargement of cardiac silhouette post CABG. Mediastinal contours stable with atherosclerotic calcification and elongation of thoracic aorta. Subsegmental atelectasis LEFT base. Lungs otherwise clear. No pleural effusion or pneumothorax. IMPRESSION: Enlargement of cardiac silhouette post CABG and pacemaker. Persistent subsegmental atelectasis LEFT base. Electronically Signed   By: Lavonia Dana M.D.   On: 12/03/2014 09:10      Assessment/Plan:   Active Problems:   Acute diastolic HF (heart failure) (HCC)   CAD, multiple vessel   S/P CABG x 5   1. Day 4 s/p CABG x5; now off pressor support 2. Volume overload/diastolic CHF 3. S/p remote BiV pacer with malfunction of A lead for probable lead extraction/revision later in week 4.  Renal insufficiency: Cr improving 1.29 today 5. OSA on CPAP; complains of poor sleep  Continue diuresis. Labs pending from today.  Troy Sine, MD, [facility] 12/03/2014, 10:27 AM

## 2014-12-03 NOTE — Progress Notes (Signed)
Patient ID: [name], female   DOB: Apr 23, 1945, 69 y.o.   MRN: [id number] EVENING ROUNDS NOTE :     [address]       [place] [address]             [phone]                 4 Days Post-Op Procedure(s) (LRB): CORONARY ARTERY BYPASS GRAFTING x 5 (LIMA-LAD, SVG-D, SVG-OM1-OM2, SVG-PD) (N/A) TRANSESOPHAGEAL ECHOCARDIOGRAM (TEE) (N/A)  Total Length of Stay:  LOS: 10 days  BP 107/51 mmHg  Pulse 103  Temp(Src) 97.8 F (36.6 C) (Oral)  Resp 21  Ht 5\' 5"  (1.651 m)  Wt 249 lb 5.4 oz (113.1 kg)  BMI 41.49 kg/m2  SpO2 100%  .Intake/Output      10/22 0701 - 10/23 0700 10/23 0701 - 10/24 0700   P.O. 200 200   I.V. (mL/kg) 639.6 (5.7) 7.6 (0.1)   Total Intake(mL/kg) 839.6 (7.4) 207.6 (1.8)   Urine (mL/kg/hr) 1325 (0.5) 1100 (0.9)   Stool  0 (0)   Total Output 1325 1100   Net -485.4 -892.4        Urine Occurrence 2 x 4 x   Stool Occurrence  2 x     . sodium chloride Stopped (12/01/14 1900)  . sodium chloride Stopped (12/01/14 1900)  . sodium chloride Stopped (12/01/14 1900)  . lactated ringers Stopped (12/03/14 0900)  . phenylephrine (NEO-SYNEPHRINE) Adult infusion Stopped (12/03/14 0900)     Lab Results  Component Value Date   WBC 11.8* 12/03/2014   HGB 8.6* 12/03/2014   HCT 27.7* 12/03/2014   PLT 219 12/03/2014   GLUCOSE 107* 12/03/2014   CHOL 189 11/24/2014   TRIG 104 11/24/2014   HDL 51 11/24/2014   LDLCALC 117* 11/24/2014   ALT 125* 12/03/2014   AST 78* 12/03/2014   NA 137 12/03/2014   K 3.7 12/03/2014   CL 95* 12/03/2014   CREATININE 1.10* 12/03/2014   BUN 36* 12/03/2014   CO2 31 12/03/2014   TSH 2.505 11/23/2014   INR 1.30 11/29/2014   HGBA1C 6.7* 11/23/2014   Feels better, breathing better, walked in hall this afternoon  Grace Isaac MD  Beeper [phone] Office [phone] 12/03/2014 5:45 PM

## 2014-12-03 NOTE — Progress Notes (Signed)
When ask pt refuse NIV. Pt is stable at this time. No distress or complication noted.

## 2014-12-03 NOTE — Progress Notes (Signed)
CBG around mn 61. PO snacks given, rechecked CBG after 15 min. Came up 85. Continued to monitor. Pt appeared asymptomatic, apart from the CBG.

## 2014-12-03 NOTE — Progress Notes (Addendum)
Patient ID: ARTERIA BOODOO, female   DOB: 03-17-1945, 69 y.o.   MRN: [id number] Patient ID: ZERIYAH PLETT, female   DOB: [birth year], 69 y.o.   MRN: [id number] TCTS DAILY ICU PROGRESS NOTE                   [address]            [place] [address]          [phone]   4 Days Post-Op Procedure(s) (LRB): CORONARY ARTERY BYPASS GRAFTING x 5 (LIMA-LAD, SVG-D, SVG-OM1-OM2, SVG-PD) (N/A) TRANSESOPHAGEAL ECHOCARDIOGRAM (TEE) (N/A)  Total Length of Stay:  LOS: 10 days   Subjective: Up in chair this am, neo off this am, complains of trouble sleeping, refuses to use bipap mask at night   Objective: Vital signs in last 24 hours: Temp:  [98 F (36.7 C)-98.9 F (37.2 C)] 98 F (36.7 C) (10/23 0700) Pulse Rate:  [96-120] 104 (10/23 0700) Cardiac Rhythm:  [-] Ventricular paced (10/23 0800) Resp:  [15-34] 17 (10/23 0700) BP: (83-146)/(43-76) 93/56 mmHg (10/23 0700) SpO2:  [91 %-100 %] 100 % (10/23 0700) Weight:  [249 lb 5.4 oz (113.1 kg)] 249 lb 5.4 oz (113.1 kg) (10/23 0600)  Filed Weights   12/01/14 0600 12/02/14 0600 12/03/14 0600  Weight: 258 lb 2.5 oz (117.1 kg) 253 lb 1.4 oz (114.8 kg) 249 lb 5.4 oz (113.1 kg)    Weight change: -3 lb 12 oz (-1.7 kg)   Hemodynamic parameters for last 24 hours:    Intake/Output from previous day: 10/22 0701 - 10/23 0700 In: 839.6 [P.O.:200; I.V.:639.6] Out: 1325 [Urine:1325]  Intake/Output this shift: Total I/O In: 200 [P.O.:200] Out: -   Current Meds: Scheduled Meds: . acetaminophen  1,000 mg Oral 4 times per day   Or  . acetaminophen (TYLENOL) oral liquid 160 mg/5 mL  1,000 mg Per Tube 4 times per day  . antiseptic oral rinse  7 mL Mouth Rinse QID  . aspirin EC  325 mg Oral Daily   Or  . aspirin  324 mg Per Tube Daily  . bisacodyl  10 mg Oral Daily   Or  . bisacodyl  10 mg Rectal Daily  . budesonide-formoterol  2 puff Inhalation BID  . chlorhexidine gluconate  15 mL Mouth Rinse BID  . docusate sodium  200  mg Oral Daily  . enoxaparin (LOVENOX) injection  40 mg Subcutaneous QHS  . insulin aspart  0-24 Units Subcutaneous 6 times per day  . insulin aspart  4 Units Subcutaneous TID WC  . insulin detemir  25 Units Subcutaneous BID  . levalbuterol  0.63 mg Nebulization QID  . pantoprazole  40 mg Oral Daily  . pravastatin  80 mg Oral QPM  . sodium chloride  3 mL Intravenous Q12H   Continuous Infusions: . sodium chloride Stopped (12/01/14 1900)  . sodium chloride Stopped (12/01/14 1900)  . sodium chloride Stopped (12/01/14 1900)  . lactated ringers 10 mL/hr at 12/02/14 1800  . phenylephrine (NEO-SYNEPHRINE) Adult infusion 5 mcg/min (12/03/14 0700)   PRN Meds:.sodium chloride, dicyclomine, iohexol, loperamide, ondansetron (ZOFRAN) IV, oxyCODONE, potassium chloride, sodium chloride, traMADol  General appearance: alert, cooperative and no distress Neurologic: intact Heart: regular rate and rhythm, S1, S2 normal, no murmur, click, rub or gallop Lungs: diminished breath sounds bibasilar Abdomen: soft, non-tender; bowel sounds normal; no masses,  no organomegaly Extremities: extremities normal, atraumatic, no cyanosis or edema and Homans sign is negative, no sign of DVT  Wound: sternum intact  Lab Results: CBC:  Recent Labs  12/01/14 0400 12/02/14 0615  WBC 15.3* 15.2*  HGB 8.6* 8.4*  HCT 27.4* 26.4*  PLT 105* 160   BMET:   Recent Labs  12/01/14 1723 12/02/14 0627  NA 140 140  K 3.9 3.4*  CL 102 98*  CO2 31 29  GLUCOSE 124* 94  BUN 31* 32*  CREATININE 1.44* 1.29*  CALCIUM 8.8* 8.7*    PT/INR: No results for input(s): LABPROT, INR in the last 72 hours. Radiology: Dg Chest Port 1 View  12/03/2014  CLINICAL DATA:  Post CABG 11/29/2014, mid sternal chest pain and shortness of breath, hypertension, asthma, diabetes mellitus EXAM: PORTABLE CHEST 1 VIEW COMPARISON:  Portable exam 0558 hours compared to 12/01/2014 FINDINGS: RIGHT subclavian pacemaker leads project over RIGHT atrium  and RIGHT ventricle. RIGHT jugular central venous catheter unchanged tip projecting over confluence of SVC. Enlargement of cardiac silhouette post CABG. Mediastinal contours stable with atherosclerotic calcification and elongation of thoracic aorta. Subsegmental atelectasis LEFT base. Lungs otherwise clear. No pleural effusion or pneumothorax. IMPRESSION: Enlargement of cardiac silhouette post CABG and pacemaker. Persistent subsegmental atelectasis LEFT base. Electronically Signed   By: Lavonia Dana M.D.   On: 12/03/2014 09:10    Lab Results  Component Value Date   HGBA1C 6.7* 11/23/2014   Assessment/Plan: S/P Procedure(s) (LRB): CORONARY ARTERY BYPASS GRAFTING x 5 (LIMA-LAD, SVG-D, SVG-OM1-OM2, SVG-PD) (N/A) TRANSESOPHAGEAL ECHOCARDIOGRAM (TEE) (N/A) Mobilize Diuresis Diabetes control D/c foley Will need ? Lead extraction revision of atrial lead    neo off Labs not drawn this am, check now Check abg Over all limited by underlying lung disease  Adjust levamir with low glucose , was not on preop dm treatment    Grace Isaac 12/03/2014 10:18 AM

## 2014-12-03 NOTE — Progress Notes (Signed)
Ask pt did she want to use her CPAP for the night, pt stated to me "that she probably wont wear it" i stated to pt if you change your mind or if you get SOB to let your RN know and I will come set up your mask and you can wear your CPAP tonight if you want to. Pt said ok. Pt has a flat mood at this time but is stable no distress or complications noted at this time.

## 2014-12-04 ENCOUNTER — Inpatient Hospital Stay (HOSPITAL_COMMUNITY): Payer: Medicare Other

## 2014-12-04 DIAGNOSIS — T82110A Breakdown (mechanical) of cardiac electrode, initial encounter: Secondary | ICD-10-CM

## 2014-12-04 LAB — GLUCOSE, CAPILLARY
Glucose-Capillary: 101 mg/dL — ABNORMAL HIGH (ref 65–99)
Glucose-Capillary: 102 mg/dL — ABNORMAL HIGH (ref 65–99)
Glucose-Capillary: 104 mg/dL — ABNORMAL HIGH (ref 65–99)
Glucose-Capillary: 111 mg/dL — ABNORMAL HIGH (ref 65–99)
Glucose-Capillary: 120 mg/dL — ABNORMAL HIGH (ref 65–99)
Glucose-Capillary: 91 mg/dL (ref 65–99)

## 2014-12-04 LAB — CBC
HCT: 25.7 % — ABNORMAL LOW (ref 36.0–46.0)
Hemoglobin: 8 g/dL — ABNORMAL LOW (ref 12.0–15.0)
MCH: 28.4 pg (ref 26.0–34.0)
MCHC: 31.1 g/dL (ref 30.0–36.0)
MCV: 91.1 fL (ref 78.0–100.0)
Platelets: 177 10*3/uL (ref 150–400)
RBC: 2.82 MIL/uL — ABNORMAL LOW (ref 3.87–5.11)
RDW: 14.8 % (ref 11.5–15.5)
WBC: 9.4 10*3/uL (ref 4.0–10.5)

## 2014-12-04 LAB — BASIC METABOLIC PANEL
Anion gap: 7 (ref 5–15)
BUN: 38 mg/dL — ABNORMAL HIGH (ref 6–20)
CO2: 34 mmol/L — ABNORMAL HIGH (ref 22–32)
Calcium: 8.3 mg/dL — ABNORMAL LOW (ref 8.9–10.3)
Chloride: 98 mmol/L — ABNORMAL LOW (ref 101–111)
Creatinine, Ser: 1.2 mg/dL — ABNORMAL HIGH (ref 0.44–1.00)
GFR calc Af Amer: 52 mL/min — ABNORMAL LOW (ref >60)
GFR calc non Af Amer: 45 mL/min — ABNORMAL LOW (ref >60)
Glucose, Bld: 102 mg/dL — ABNORMAL HIGH (ref 65–99)
Potassium: 3.9 mmol/L (ref 3.5–5.1)
Sodium: 139 mmol/L (ref 135–145)

## 2014-12-04 MED ORDER — POTASSIUM CHLORIDE CRYS ER 20 MEQ PO TBCR
20.0000 meq | EXTENDED_RELEASE_TABLET | Freq: Two times a day (BID) | ORAL | Status: DC
  Administered 2014-12-04 – 2014-12-07 (×7): 20 meq via ORAL
  Filled 2014-12-04 (×7): qty 1

## 2014-12-04 MED ORDER — MOVING RIGHT ALONG BOOK
Freq: Once | Status: AC
  Administered 2014-12-04: 11:00:00
  Filled 2014-12-04: qty 1

## 2014-12-04 MED ORDER — SODIUM CHLORIDE 0.9 % IJ SOLN
3.0000 mL | Freq: Two times a day (BID) | INTRAMUSCULAR | Status: DC
  Administered 2014-12-04 – 2014-12-07 (×6): 3 mL via INTRAVENOUS

## 2014-12-04 MED ORDER — FUROSEMIDE 40 MG PO TABS
40.0000 mg | ORAL_TABLET | Freq: Two times a day (BID) | ORAL | Status: DC
  Administered 2014-12-04 – 2014-12-06 (×4): 40 mg via ORAL
  Filled 2014-12-04 (×5): qty 1

## 2014-12-04 MED ORDER — LEVALBUTEROL HCL 0.63 MG/3ML IN NEBU: 0.6300 mg | INHALATION_SOLUTION | Freq: Four times a day (QID) | RESPIRATORY_TRACT | Status: DC | PRN

## 2014-12-04 MED ORDER — ALPRAZOLAM 0.25 MG PO TABS
0.2500 mg | ORAL_TABLET | Freq: Four times a day (QID) | ORAL | Status: DC | PRN
Start: 2014-12-04 — End: 2014-12-07
  Administered 2014-12-06 – 2014-12-07 (×2): 0.25 mg via ORAL
  Filled 2014-12-04 (×2): qty 1

## 2014-12-04 MED ORDER — LORATADINE 10 MG PO TABS
10.0000 mg | ORAL_TABLET | Freq: Every day | ORAL | Status: DC
  Administered 2014-12-04 – 2014-12-07 (×4): 10 mg via ORAL
  Filled 2014-12-04 (×4): qty 1

## 2014-12-04 MED ORDER — ALUM & MAG HYDROXIDE-SIMETH 200-200-20 MG/5ML PO SUSP: 15.0000 mL | ORAL | Status: DC | PRN

## 2014-12-04 MED ORDER — CLOTRIMAZOLE 10 MG MT TROC
10.0000 mg | OROMUCOSAL | Status: DC | PRN
  Filled 2014-12-04: qty 1

## 2014-12-04 MED ORDER — INSULIN ASPART 100 UNIT/ML ~~LOC~~ SOLN
0.0000 [IU] | Freq: Three times a day (TID) | SUBCUTANEOUS | Status: DC
  Administered 2014-12-06 – 2014-12-07 (×2): 2 [IU] via SUBCUTANEOUS

## 2014-12-04 MED ORDER — ZOLPIDEM TARTRATE 5 MG PO TABS: 5.0000 mg | ORAL_TABLET | Freq: Every evening | ORAL | Status: DC | PRN

## 2014-12-04 MED ORDER — FLUTICASONE PROPIONATE 50 MCG/ACT NA SUSP
1.0000 | Freq: Every day | NASAL | Status: DC
  Administered 2014-12-04 – 2014-12-07 (×4): 1 via NASAL
  Filled 2014-12-04: qty 16

## 2014-12-04 MED ORDER — MAGNESIUM HYDROXIDE 400 MG/5ML PO SUSP: 30.0000 mL | Freq: Every day | ORAL | Status: DC | PRN

## 2014-12-04 MED ORDER — SODIUM CHLORIDE 0.9 % IV SOLN: 250.0000 mL | INTRAVENOUS | Status: DC | PRN

## 2014-12-04 MED ORDER — SODIUM CHLORIDE 0.9 % IJ SOLN: 3.0000 mL | INTRAMUSCULAR | Status: DC | PRN

## 2014-12-04 MED ORDER — GUAIFENESIN-DM 100-10 MG/5ML PO SYRP
15.0000 mL | ORAL_SOLUTION | ORAL | Status: DC | PRN
  Administered 2014-12-04 – 2014-12-07 (×2): 15 mL via ORAL
  Filled 2014-12-04 (×3): qty 15

## 2014-12-04 NOTE — Progress Notes (Signed)
Pt refusing cpap at this time, RT informed pt to call for RT is she changes her mind during the night

## 2014-12-04 NOTE — Progress Notes (Signed)
Physical Therapy Treatment Patient Details Name: [name] MRN: [id number] DOB: 01-Mar-1945 Today's Date: 12/04/2014    History of Present Illness pt is a 69 y.o. female, PMHx of CHF, pacer, sleep apnea and [place] admitted with progressive SOB and chest pain.  Per CATH 3-vessel ds, s/p CABG x5.    PT Comments    Pt improving steadily, but still very deconditioned and mildly unsteady of gait.  Sats are maintained in the upper 90's during ambulation,  EHR climbs up to the 130's.  Follow Up Recommendations  SNF;Supervision/Assistance - 24 hour     Equipment Recommendations  Other (comment)    Recommendations for Other Services       Precautions / Restrictions Precautions Precautions: Fall;Sternal Precaution Comments: educated on sternal precautions Restrictions Weight Bearing Restrictions: Yes (sternal precautions)    Mobility  Bed Mobility Overal bed mobility: Needs Assistance Bed Mobility: Rolling;Sit to Sidelying Rolling: Supervision       Sit to sidelying: Supervision General bed mobility comments: sitting EOB on arrival  Transfers Overall transfer level: Needs assistance Equipment used: Rolling walker (2 wheeled) Transfers: Sit to/from Stand Sit to Stand: Min guard Stand pivot transfers: Min guard       General transfer comment: cued for sternal precautions  Ambulation/Gait Ambulation/Gait assistance: Min assist Ambulation Distance (Feet): 150 Feet Assistive device: Rolling walker (2 wheeled) Gait Pattern/deviations: Step-through pattern Gait velocity: slow Gait velocity interpretation: Below normal speed for age/gender General Gait Details: improving steadiness, not as weak-kneed,quick to fatigue, wanting to sit, but standing for rest x4.   Stairs            Wheelchair Mobility    Modified Rankin (Stroke Patients Only)       Balance Overall balance assessment: Needs assistance Sitting-balance support: No upper extremity  supported Sitting balance-Leahy Scale: Good     Standing balance support: No upper extremity supported;Single extremity supported Standing balance-Leahy Scale: Fair                      Cognition Arousal/Alertness: Awake/alert Behavior During Therapy: WFL for tasks assessed/performed Overall Cognitive Status: Within Functional Limits for tasks assessed                      Exercises      General Comments General comments (skin integrity, edema, etc.): EHR in th 120's and low 130's bpm, sats upper 90's, but with noticeably dyspnea.      Pertinent Vitals/Pain Pain Assessment: Faces Pain Score: 3  Faces Pain Scale: Hurts little more Pain Location: chest Pain Descriptors / Indicators: Grimacing Pain Intervention(s): Monitored during session    Home Living Family/patient expects to be discharged to:: [place]: Children;Other relatives Available Help at Discharge: Available PRN/intermittently;Family Type of Home: House Home Access: Stairs to enter Entrance Stairs-Rails: Right;Left Home Layout: Two level Home Equipment: Environmental consultant - 2 wheels      Prior Function Level of Independence: Independent          PT Goals (current goals can now be found in the care plan section) Acute Rehab PT Goals Patient Stated Goal: be independent PT Goal Formulation: With patient Time For Goal Achievement: 12/07/14 Potential to Achieve Goals: Good    Frequency  Min 3X/week    PT Plan Current plan remains appropriate    Co-evaluation             End of Session   Activity Tolerance: Patient tolerated treatment well;Patient limited  by fatigue Patient left: Other (comment);with call bell/phone within reach;with bed alarm set (sitting EOB)     Time: [id number] PT Time Calculation (min) (ACUTE ONLY): 21 min  Charges:  $Gait Training: 8-22 mins                    G Codes:      Ulis Kaps, Tessie Fass 12/04/2014, 3:02  PM 12/04/2014  Donnella Sham, PT [phone]  (pager)

## 2014-12-04 NOTE — Progress Notes (Signed)
RT note: Pt. seen for RT consult, assessed H&P, ? Pulmonology consult if needed.

## 2014-12-04 NOTE — Evaluation (Signed)
Occupational Therapy Evaluation Patient Details Name: [name] MRN: [id number] DOB: 1945/11/25 Today's Date: 12/04/2014    History of Present Illness pt is a 69 y.o. female, PMHx of CHF, pacer, sleep apnea and [place] admitted with progressive SOB and chest pain.  Per CATH 3-vessel ds, s/p CABG x5.   Clinical Impression   Pt s/p above. Pt independent with ADLs, PTA. Feel pt will benefit from acute OT to increase independence and activity tolerance prior to d/c.     Follow Up Recommendations  SNF    Equipment Recommendations  Other (comment) (defer to next venue)    Recommendations for Other Services       Precautions / Restrictions Precautions Precautions: Fall;Sternal Precaution Comments: educated on sternal precautions Restrictions Weight Bearing Restrictions: Yes (sternal precautions)      Mobility Bed Mobility Overal bed mobility: Needs Assistance Bed Mobility: Rolling;Sit to Sidelying Rolling: Supervision       Sit to sidelying: Supervision  Comments: Assist scooting HOB and trendelenburg position used.     Transfers Overall transfer level: Needs assistance   Transfers: Sit to/from [employer] Transfers Sit to Stand: Min guard (Min guard-supervision) Stand pivot transfers: Min guard            Balance    Min guard-stand pivot transfer.                                         ADL Overall ADL's : Needs assistance/impaired     Grooming: Set up;Supervision/safety;Sitting               Lower Body Dressing: Maximal assistance;Sit to/from stand   Toilet Transfer: Min guard;Stand-pivot (supervision-Min guard for sit to stand)   Toileting- Water quality scientist and Hygiene: Moderate assistance;Sit to/from stand       Functional mobility during ADLs: Min guard (stand pivot transfer) General ADL Comments: cues for precautions in session.     Vision     Perception     Praxis      Pertinent Vitals/Pain Pain  Assessment: 0-10 Pain Score: 3  Pain Location: chest  Pain Descriptors / Indicators: Aching Pain Intervention(s): Monitored during session;Repositioned  HR up to 120s in session but trended down.     Hand Dominance     Extremity/Trunk Assessment Upper Extremity Assessment Upper Extremity Assessment: Overall WFL for tasks assessed   Lower Extremity Assessment Lower Extremity Assessment: Defer to PT evaluation       Communication Communication Communication: No difficulties   Cognition Arousal/Alertness: Awake/alert Behavior During Therapy: WFL for tasks assessed/performed (became anxious toward end) Overall Cognitive Status: Within Functional Limits for tasks assessed                     General Comments       Exercises       Shoulder Instructions      Home Living Family/patient expects to be discharged to:: Skilled nursing facility Living Arrangements: Children;Other relatives Available Help at Discharge: Available PRN/intermittently;Family Type of Home: House Home Access: Stairs to enter [employer] of Steps: several Entrance Stairs-Rails: Right;Left Home Layout: Two level Alternate Level Stairs-Number of Steps: flight Alternate Level Stairs-Rails: Right;Left Bathroom Shower/Tub: Occupational psychologist: Standard     Home Equipment: Environmental consultant - 2 wheels          Prior Functioning/Environment Level of Independence: Independent  OT Diagnosis: Generalized weakness;Acute pain   OT Problem List: Cardiopulmonary status limiting activity;Decreased knowledge of precautions;Decreased knowledge of use of DME or AE;Decreased strength;Decreased activity tolerance;Decreased range of motion;Obesity;Pain   OT Treatment/Interventions: Self-care/ADL training;Energy conservation;DME and/or AE instruction;Therapeutic activities;Patient/family education;Balance training    OT Goals(Current goals can be found in the care plan  section) Acute Rehab OT Goals Patient Stated Goal: be independent OT Goal Formulation: With patient Time For Goal Achievement: 12/11/14 Potential to Achieve Goals: Good ADL Goals Pt Will Perform Lower Body Bathing: with set-up;with supervision;with adaptive equipment;sit to/from stand Pt Will Perform Lower Body Dressing: with set-up;with supervision;with adaptive equipment;sit to/from stand Pt Will Transfer to Toilet: with supervision;ambulating Pt Will Perform Toileting - Clothing Manipulation and hygiene: with set-up;with supervision;with adaptive equipment;sit to/from stand  OT Frequency: Min 2X/week   Barriers to D/C:            Co-evaluation              End of Session Equipment Utilized During Treatment: Oxygen Nurse Communication: Other (comment) (tech assisted with bed mobility)  Activity Tolerance: Other (comment) (lightheaded) Patient left: in bed;with call bell/phone within reach;with bed alarm set;Other (comment) (nurse tech in room)   Time: [id number] OT Time Calculation (min): 14 min Charges:  OT General Charges $OT Visit: 1 Procedure OT Evaluation $Initial OT Evaluation Tier I: 1 Procedure G-CodesBenito Mccreedy OTR/L [id number] 12/04/2014, 1:13 PM

## 2014-12-04 NOTE — Clinical Social Work Note (Signed)
Clinical Social Work Assessment  Patient Details  Name: [name] MRN: [id number] Date of Birth: Apr 03, 1945  Date of referral:  12/04/14               Reason for consult:  Facility Placement                Permission sought to share information with:  Family Supports, Chartered certified accountant granted to share information::  Yes, Verbal Permission Granted  Name::     [name] and [name]::  [facility]  Relationship::  sons  Contact Information:     Housing/Transportation Living arrangements for the past 2 months:  Single Family Home Source of Information:  Patient Patient Interpreter Needed:  None Criminal Activity/Legal Involvement Pertinent to Current Situation/Hospitalization:  No - Comment as needed Significant Relationships:  Adult Children, Other Family Members Lives with:  Adult Children Do you feel safe going back to the place where you live?  Yes Need for family participation in patient care:  No (Coment)  Care giving concerns:  Pt requiring extra assitance following surgery- she lives with two sons, daughter in law, and 2 small grandchildren- adult children work except for one son who remains at home most the day to take care of the children (this son does have panic attach disorder)   Facilities manager / plan:  CSW spoke with pt concerning PT recommendation for SNF  Employment status:  Retired Forensic scientist:  Programmer, applications, Medicaid In [place] PT Recommendations:  [place] / Referral to community resources:  [place]  Patient/Family's Response to care:  Pt is agreeable to short term SNF- states that she feels weaker and does feel as if she would benefit from rehab- also states it is probably best not to be around the small children right now since they often climb on her at home  Patient/Family's Understanding of and Emotional Response to Diagnosis, Current Treatment, and  Prognosis:  Pt seems to have good understanding of condition and does not have any questions or concerns  Emotional Assessment Appearance:  Appears stated age Attitude/Demeanor/Rapport:    Affect (typically observed):  Appropriate, Pleasant Orientation:  Oriented to Self, Oriented to Place, Oriented to  Time, Oriented to Situation Alcohol / Substance use:  Not Applicable Psych involvement (Current and /or in the community):  No (Comment)  Discharge Needs  Concerns to be addressed:  Care Coordination Readmission within the last 30 days:  No Current discharge risk:  Physical Impairment Barriers to Discharge:  Continued Medical Work up   [employer], LCSW 12/04/2014, 5:05 PM

## 2014-12-04 NOTE — Progress Notes (Signed)
Subjective: Just transferred to 2W tired and mildly SOB.  Objective: Vital signs in last 24 hours: Temp:  [97.8 F (36.6 C)-98.5 F (36.9 C)] 98.5 F (36.9 C) (10/24 0821) Pulse Rate:  [93-116] 104 (10/24 0800) Resp:  [16-29] 27 (10/24 0800) BP: (77-122)/(41-68) 108/53 mmHg (10/24 0800) SpO2:  [90 %-100 %] 100 % (10/24 0854) Weight:  [251 lb 5.2 oz (114 kg)] 251 lb 5.2 oz (114 kg) (10/24 0600) Weight change: 1 lb 15.8 oz (0.9 kg) Last BM Date: 12/03/14 Intake/Output from previous day: -8649  10/23 0701 - 10/24 0700 In: 1857.6 [P.O.:1680; I.V.:27.6; IV Piggyback:150] Out: 2050 [Urine:2050] Intake/Output this shift:    PE: General:Pleasant affect, NAD Skin:Warm and dry, brisk capillary refill HEENT:normocephalic, sclera clear, mucus membranes moist Neck:supple, no JVD, no bruits  Heart:S1S2 RRR without murmur, gallup, rub or click Lungs:diminished in bases without rales, rhonchi, or wheezes VZS:MOLM, non tender, + BS, do not palpate liver spleen or masses Ext:tr lower ext edema, 2+ pedal pulses, 2+ radial pulses Neuro:alert and oriented X 3, MAE, follows commands, + facial symmetry Tele:  V pacing EKG on the 20th with SR and V pacing-BiV pacing  Lab Results:  Recent Labs  12/02/14 0615 12/03/14 1045  WBC 15.2* 11.8*  HGB 8.4* 8.6*  HCT 26.4* 27.7*  PLT 160 219   BMET  Recent Labs  12/02/14 0627 12/03/14 1045  NA 140 137  K 3.4* 3.7  CL 98* 95*  CO2 29 31  GLUCOSE 94 107*  BUN 32* 36*  CREATININE 1.29* 1.10*  CALCIUM 8.7* 8.5*   No results for input(s): TROPONINI in the last 72 hours.  Invalid input(s): CK, MB  Lab Results  Component Value Date   CHOL 189 11/24/2014   HDL 51 11/24/2014   LDLCALC 117* 11/24/2014   TRIG 104 11/24/2014   CHOLHDL 3.7 11/24/2014   Lab Results  Component Value Date   HGBA1C 6.7* 11/23/2014     Lab Results  Component Value Date   TSH 2.505 11/23/2014    Hepatic Function Panel  Recent Labs  12/03/14 1045  PROT 5.7*  ALBUMIN 2.7*  AST 78*  ALT 125*  ALKPHOS 172*  BILITOT 1.3*   No results for input(s): CHOL in the last 72 hours. No results for input(s): PROTIME in the last 72 hours.     Studies/Results: Dg Chest Port 1 View  12/03/2014  CLINICAL DATA:  Post CABG 11/29/2014, mid sternal chest pain and shortness of breath, hypertension, asthma, diabetes mellitus EXAM: PORTABLE CHEST 1 VIEW COMPARISON:  Portable exam 0558 hours compared to 12/01/2014 FINDINGS: RIGHT subclavian pacemaker leads project over RIGHT atrium and RIGHT ventricle. RIGHT jugular central venous catheter unchanged tip projecting over confluence of SVC. Enlargement of cardiac silhouette post CABG. Mediastinal contours stable with atherosclerotic calcification and elongation of thoracic aorta. Subsegmental atelectasis LEFT base. Lungs otherwise clear. No pleural effusion or pneumothorax. IMPRESSION: Enlargement of cardiac silhouette post CABG and pacemaker. Persistent subsegmental atelectasis LEFT base. Electronically Signed   By: Lavonia Dana M.D.   On: 12/03/2014 09:10    Medications: I have reviewed the patient's current medications. Scheduled Meds: . acetaminophen  1,000 mg Oral 4 times per day   Or  . acetaminophen (TYLENOL) oral liquid 160 mg/5 mL  1,000 mg Per Tube 4 times per day  . antiseptic oral rinse  7 mL Mouth Rinse QID  . aspirin EC  325 mg Oral Daily   Or  .  aspirin  324 mg Per Tube Daily  . bisacodyl  10 mg Oral Daily   Or  . bisacodyl  10 mg Rectal Daily  . budesonide-formoterol  2 puff Inhalation BID  . chlorhexidine gluconate  15 mL Mouth Rinse BID  . docusate sodium  200 mg Oral Daily  . enoxaparin (LOVENOX) injection  40 mg Subcutaneous QHS  . fluticasone  1 spray Each Nare Daily  . furosemide  40 mg Oral BID  . insulin aspart  0-15 Units Subcutaneous TID WC  . insulin aspart  4 Units Subcutaneous TID WC  . insulin detemir  15 Units Subcutaneous Daily  . levalbuterol  0.63  mg Nebulization QID  . loratadine  10 mg Oral Daily  . pantoprazole  40 mg Oral Daily  . potassium chloride  20 mEq Oral BID  . pravastatin  80 mg Oral QPM  . sodium chloride  10 mL Intravenous Q12H   Continuous Infusions: . sodium chloride Stopped (12/01/14 1900)  . sodium chloride Stopped (12/01/14 1900)  . sodium chloride Stopped (12/01/14 1900)  . lactated ringers Stopped (12/03/14 0900)  . phenylephrine (NEO-SYNEPHRINE) Adult infusion Stopped (12/03/14 0900)   PRN Meds:.sodium chloride, clotrimazole, dicyclomine, iohexol, loperamide, ondansetron (ZOFRAN) IV, oxyCODONE, sodium chloride, traMADol  Assessment/Plan: Active Problems:   Acute diastolic HF (heart failure) (HCC)   CAD, multiple vessel   S/P CABG x 5  [name] is a 69 y.o. female with a history of chronic diastolic heart failure, nonischemic cardiomyopathy- now resolved with biV pacing, morbid obesity, OSA intolerant to CPAP, non obst CAD, HTN, HLD, who was admitted to [facility] from the office on 11/23/14 for sx concerning for [place]. She undwerwent cardiac cath which revealed severe 3V CAD and was referred for CABG. She underwent CABGx5V on 11/29/14. During this procedure the old RV pacemaker lead was found to be lodged into the pericardium and required clipping and suture. Electrophysiology has been consulted for further recommendations  Interrogation shows functioning RV and LV leads, and a dysfunctional RA lead. Will need to have lead revision done. Patient has multiple leads down, will get venogram to determine if the vein is currently open. Lead revision likely to be next week prior to discharge from the hospital.    Dr. Lovena Le to see for planning.    LOS: 11 days   Time spent with pt. :15 minutes. [facility] R  Nurse Practitioner Certified Pager [phone] or after 5pm and on weekends call [phone] 12/04/2014, 9:16 AM  EP Attending  Patient seen and examined. She is well known to me from prior procedures.  Her old RV pacing lead which was placed in 2001 (before I met her) was found to have perforated the heart and was cut at the time of surgery. Her atrial lead is malfunctioning. She has an ICD lead for which only the rate/sensing portion is active. A venogram demonstrated that her veins are open.   Rec: a difficult set of problems. If she was in atrial fib, I would not add an atrial lead. However, she appears to be maintaining NSR. Will check on interogation.   Addendum: she has had worsening of her atrial lead function since her surgery. I am not sure what her underlying rhythm is. Hopefully we can still sense her atrial p waves. If so might reprogram to VDD. I hope to not have to do another procedure on her as her infectious risk is very high.  Mikle Bosworth.D.

## 2014-12-04 NOTE — Care Management Important Message (Signed)
Important Message  Patient Details  Name: [name] MRN: [id number] Date of Birth: Apr 07, 1945   Medicare Important Message Given:  Yes-third notification given    Nathen May 12/04/2014, 10:26 AM

## 2014-12-04 NOTE — Progress Notes (Signed)
5 Days Post-Op Procedure(s) (LRB): CORONARY ARTERY BYPASS GRAFTING x 5 (LIMA-LAD, SVG-D, SVG-OM1-OM2, SVG-PD) (N/A) TRANSESOPHAGEAL ECHOCARDIOGRAM (TEE) (N/A) Subjective: Feels better this AM  Objective: Vital signs in last 24 hours: Temp:  [97.8 F (36.6 C)-98.5 F (36.9 C)] 98.3 F (36.8 C) (10/24 0028) Pulse Rate:  [93-116] 97 (10/24 0600) Cardiac Rhythm:  [-] Ventricular paced (10/23 2000) Resp:  [16-29] 17 (10/24 0600) BP: (77-122)/(41-68) 91/47 mmHg (10/24 0600) SpO2:  [90 %-100 %] 90 % (10/24 0600) Weight:  [251 lb 5.2 oz (114 kg)] 251 lb 5.2 oz (114 kg) (10/24 0600)  Hemodynamic parameters for last 24 hours:    Intake/Output from previous day: 10/23 0701 - 10/24 0700 In: 1857.6 [P.O.:1680; I.V.:27.6; IV Piggyback:150] Out: 2050 [Urine:2050] Intake/Output this shift:    General appearance: alert, cooperative and no distress Neurologic: intact Heart: regular rate and rhythm Lungs: diminished breath sounds bibasilar Abdomen: normal findings: soft, non-tender Extremities: extensive ecchymosis Wound: clean and dry  Lab Results:  Recent Labs  12/02/14 0615 12/03/14 1045  WBC 15.2* 11.8*  HGB 8.4* 8.6*  HCT 26.4* 27.7*  PLT 160 219   BMET:  Recent Labs  12/02/14 0627 12/03/14 1045  NA 140 137  K 3.4* 3.7  CL 98* 95*  CO2 29 31  GLUCOSE 94 107*  BUN 32* 36*  CREATININE 1.29* 1.10*  CALCIUM 8.7* 8.5*    PT/INR: No results for input(s): LABPROT, INR in the last 72 hours. ABG    Component Value Date/Time   PHART 7.506* 12/03/2014 1136   HCO3 32.0* 12/03/2014 1136   TCO2 33 12/03/2014 1136   ACIDBASEDEF 4.0* 11/29/2014 1541   O2SAT 94.0 12/03/2014 1136   CBG (last 3)   Recent Labs  12/03/14 1550 12/03/14 2025 12/04/14 0026  GLUCAP 118* 141* 120*    Assessment/Plan: S/P Procedure(s) (LRB): CORONARY ARTERY BYPASS GRAFTING x 5 (LIMA-LAD, SVG-D, SVG-OM1-OM2, SVG-PD) (N/A) TRANSESOPHAGEAL ECHOCARDIOGRAM (TEE) (N/A) Plan for transfer to  step-down: see transfer orders   CV- BP still soft. Paced rhythm  RESP- LLL atelectasis, continue IS, flutter  RENAL- creatinine OK, resume PO lasix, may need to restart PO zaroxylyn in a day or two  ENDO- CBG well controlled, change to AC/HS  Anemia secondary to ABL, mild, follow  DVT prophylaxis- SCD + enoxaparin  Transfer to PTCU   LOS: 11 days    Melrose Nakayama 12/04/2014

## 2014-12-04 NOTE — Progress Notes (Signed)
1500 Came to see pt to walk. Pt stated she just walked with PT.  Encouraged IS and flutter valve. Pt demonstrated to see if using correctly. Will follow up tomorrow. Graylon Good RN BSN 12/04/2014 3:01 PM

## 2014-12-04 NOTE — Progress Notes (Signed)
Subjective:  Feels well. Breathing improved. Walking laps around unit.   Objective:   Vital Signs : Filed Vitals:   12/04/14 0500 12/04/14 0600 12/04/14 0700 12/04/14 0800  BP: 88/53 91/47 102/52 108/53  Pulse: 95 97 99 104  Temp:      TempSrc:      Resp: 17 17 24 27   Height:      Weight:  114 kg (251 lb 5.2 oz)    SpO2: 93% 90% 100% 100%    Intake/Output from previous day:  Intake/Output Summary (Last 24 hours) at 12/04/14 0819 Last data filed at 12/04/14 0600  Gross per 24 hour  Intake 1857.6 ml  Output   1800 ml  Net   57.6 ml    I/O since admission: -8256  Wt Readings from Last 3 Encounters:  12/04/14 114 kg (251 lb 5.2 oz)  11/23/14 113.127 kg (249 lb 6.4 oz)  10/18/14 111.585 kg (246 lb)   Pre-op wt 242; wt today 251, peak post-op wt at 166  Medications: . acetaminophen  1,000 mg Oral 4 times per day   Or  . acetaminophen (TYLENOL) oral liquid 160 mg/5 mL  1,000 mg Per Tube 4 times per day  . antiseptic oral rinse  7 mL Mouth Rinse QID  . aspirin EC  325 mg Oral Daily   Or  . aspirin  324 mg Per Tube Daily  . bisacodyl  10 mg Oral Daily   Or  . bisacodyl  10 mg Rectal Daily  . budesonide-formoterol  2 puff Inhalation BID  . chlorhexidine gluconate  15 mL Mouth Rinse BID  . docusate sodium  200 mg Oral Daily  . enoxaparin (LOVENOX) injection  40 mg Subcutaneous QHS  . fluticasone  1 spray Each Nare Daily  . furosemide  40 mg Oral BID  . insulin aspart  0-15 Units Subcutaneous TID WC  . insulin aspart  4 Units Subcutaneous TID WC  . insulin detemir  15 Units Subcutaneous Daily  . levalbuterol  0.63 mg Nebulization QID  . loratadine  10 mg Oral Daily  . pantoprazole  40 mg Oral Daily  . potassium chloride  20 mEq Oral BID  . pravastatin  80 mg Oral QPM  . sodium chloride  10 mL Intravenous Q12H    . sodium chloride Stopped (12/01/14 1900)  . sodium chloride Stopped (12/01/14 1900)  . sodium chloride Stopped (12/01/14 1900)  . lactated ringers  Stopped (12/03/14 0900)  . phenylephrine (NEO-SYNEPHRINE) Adult infusion Stopped (12/03/14 0900)    Physical Exam:   General appearance: alert, cooperative, NAD Neck: no adenopathy, no JVD, supple, symmetrical, trachea midline and thyroid not enlarged, symmetric, no tenderness/mass/nodules. Right IJ catheter in place. Lungs: decreased BS bases; no wheezing Heart: regular rate and rhythm, no rub and 1/6 sem Abdomen: soft, non-tender; bowel sounds normal; no masses,  no organomegaly Extremities: mild tense LE edema Neurologic: Grossly normal  Telemetry: atrial sensed and V paced 106    Lab Results:   Recent Labs  12/01/14 1723 12/02/14 0627 12/03/14 1045  NA 140 140 137  K 3.9 3.4* 3.7  CL 102 98* 95*  CO2 31 29 31   GLUCOSE 124* 94 107*  BUN 31* 32* 36*  CREATININE 1.44* 1.29* 1.10*  CALCIUM 8.8* 8.7* 8.5*    Hepatic Function Latest Ref Rng 12/03/2014 11/23/2014 07/07/2014  Total Protein 6.5 - 8.1 g/dL 5.7(L) 7.2 7.1  Albumin 3.5 - 5.0 g/dL 2.7(L) 3.6 3.6  AST 15 - 41  U/L 78(H) 24 25  ALT 14 - 54 U/L 125(H) 27 27  Alk Phosphatase 38 - 126 U/L 172(H) 86 81  Total Bilirubin 0.3 - 1.2 mg/dL 1.3(H) 0.5 1.2     Recent Labs  12/02/14 0615 12/03/14 1045  WBC 15.2* 11.8*  NEUTROABS 11.8*  --   HGB 8.4* 8.6*  HCT 26.4* 27.7*  MCV 91.3 89.9  PLT 160 219    No results for input(s): TROPONINI in the last 72 hours.  Invalid input(s): CK, MB  Lab Results  Component Value Date   TSH 2.505 11/23/2014   No results for input(s): HGBA1C in the last 72 hours.   Recent Labs  12/03/14 1045  PROT 5.7*  ALBUMIN 2.7*  AST 78*  ALT 125*  ALKPHOS 172*  BILITOT 1.3*   No results for input(s): INR in the last 72 hours. BNP (last 3 results)  Recent Labs  11/23/14 1744  BNP 68.0    ProBNP (last 3 results) No results for input(s): PROBNP in the last 8760 hours.   Lipid Panel     Component Value Date/Time   CHOL 189 11/24/2014 0432   TRIG 104 11/24/2014 0432    HDL 51 11/24/2014 0432   CHOLHDL 3.7 11/24/2014 0432   VLDL 21 11/24/2014 0432   LDLCALC 117* 11/24/2014 0432     Imaging:  Dg Chest Port 1 View  12/03/2014  CLINICAL DATA:  Post CABG 11/29/2014, mid sternal chest pain and shortness of breath, hypertension, asthma, diabetes mellitus EXAM: PORTABLE CHEST 1 VIEW COMPARISON:  Portable exam 0558 hours compared to 12/01/2014 FINDINGS: RIGHT subclavian pacemaker leads project over RIGHT atrium and RIGHT ventricle. RIGHT jugular central venous catheter unchanged tip projecting over confluence of SVC. Enlargement of cardiac silhouette post CABG. Mediastinal contours stable with atherosclerotic calcification and elongation of thoracic aorta. Subsegmental atelectasis LEFT base. Lungs otherwise clear. No pleural effusion or pneumothorax. IMPRESSION: Enlargement of cardiac silhouette post CABG and pacemaker. Persistent subsegmental atelectasis LEFT base. Electronically Signed   By: Lavonia Dana M.D.   On: 12/03/2014 09:10      Assessment/Plan:   Active Problems:   Acute diastolic HF (heart failure) (HCC)   CAD, multiple vessel   S/P CABG x 5   1. s/p CABG x5; 11/29/14. Clinically doing very well.  2. Volume overload/diastolic CHF continue lasix 40 mg bid po. 3. S/p remote BiV pacer with malfunction of A lead and penetration of V lead into pericardium for  lead extraction/revision later in week. EP aware. 4. Renal insufficiency: Cr improving 1.10 today 5. OSA on CPAP  Continue diuresis. Ambulate. Anticipate tx to floor.   Chyane Greer [place] MD, [facility]    12/04/2014, 8:19 AM

## 2014-12-04 NOTE — Clinical Social Work Placement (Signed)
   CLINICAL SOCIAL WORK PLACEMENT  NOTE  Date:  12/04/2014  Patient Details  Name: [name] MRN: [id number] Date of Birth: [birth year]  Clinical Social Work is seeking post-discharge placement for this patient at the [place] level of care (*CSW will initial, date and re-position this form in  chart as items are completed):  Yes   Patient/family provided with [facility] Work Department's list of facilities offering this level of care within the geographic area requested by the patient (or if unable, by the patient's family).  Yes   Patient/family informed of their freedom to choose among providers that offer the needed level of care, that participate in Medicare, Medicaid or managed care program needed by the patient, have an available bed and are willing to accept the patient.  Yes   Patient/family informed of [facility]'s ownership interest in [facility] and [facility], as well as of the fact that they are under no obligation to receive care at these facilities.  PASRR submitted to EDS on 12/04/14     PASRR number received on 12/04/14     Existing PASRR number confirmed on       FL2 transmitted to all facilities in geographic area requested by pt/family on 12/04/14     FL2 transmitted to all facilities within larger geographic area on       Patient informed that his/her managed care company has contracts with or will negotiate with certain facilities, including the following:            Patient/family informed of bed offers received.  Patient chooses bed at       Physician recommends and patient chooses bed at      Patient to be transferred to   on  .  Patient to be transferred to facility by       Patient family notified on   of transfer.  Name of family member notified:        PHYSICIAN Please sign FL2     Additional Comment:    _______________________________________________ [place] Mon, LCSW 12/04/2014,  5:08 PM

## 2014-12-05 ENCOUNTER — Ambulatory Visit: Payer: Medicare Other | Admitting: Physician Assistant

## 2014-12-05 ENCOUNTER — Inpatient Hospital Stay (HOSPITAL_COMMUNITY): Payer: Medicare Other

## 2014-12-05 LAB — CBC
HCT: 27.2 % — ABNORMAL LOW (ref 36.0–46.0)
Hemoglobin: 8.2 g/dL — ABNORMAL LOW (ref 12.0–15.0)
MCH: 27.9 pg (ref 26.0–34.0)
MCHC: 30.1 g/dL (ref 30.0–36.0)
MCV: 92.5 fL (ref 78.0–100.0)
Platelets: 200 10*3/uL (ref 150–400)
RBC: 2.94 MIL/uL — ABNORMAL LOW (ref 3.87–5.11)
RDW: 14.8 % (ref 11.5–15.5)
WBC: 12.5 10*3/uL — ABNORMAL HIGH (ref 4.0–10.5)

## 2014-12-05 LAB — BASIC METABOLIC PANEL
Anion gap: 9 (ref 5–15)
BUN: 39 mg/dL — ABNORMAL HIGH (ref 6–20)
CO2: 31 mmol/L (ref 22–32)
Calcium: 8.2 mg/dL — ABNORMAL LOW (ref 8.9–10.3)
Chloride: 95 mmol/L — ABNORMAL LOW (ref 101–111)
Creatinine, Ser: 1.3 mg/dL — ABNORMAL HIGH (ref 0.44–1.00)
GFR calc Af Amer: 47 mL/min — ABNORMAL LOW (ref >60)
GFR calc non Af Amer: 41 mL/min — ABNORMAL LOW (ref >60)
Glucose, Bld: 108 mg/dL — ABNORMAL HIGH (ref 65–99)
Potassium: 4 mmol/L (ref 3.5–5.1)
Sodium: 135 mmol/L (ref 135–145)

## 2014-12-05 LAB — GLUCOSE, CAPILLARY
Glucose-Capillary: 101 mg/dL — ABNORMAL HIGH (ref 65–99)
Glucose-Capillary: 120 mg/dL — ABNORMAL HIGH (ref 65–99)
Glucose-Capillary: 103 mg/dL — ABNORMAL HIGH (ref 65–99)
Glucose-Capillary: 95 mg/dL (ref 65–99)

## 2014-12-05 NOTE — Progress Notes (Signed)
CARDIAC REHAB PHASE I   PRE:  Rate/Rhythm: 111 paced  BP:  Sitting: 106/63        SaO2: 97 2 L  MODE:  Ambulation: 150 ft   POST:  Rate/Rhythm: 71 paced  BP:  Sitting: 103/64         SaO2: 93 2 L  Pt very reluctant to ambulate today, finally agreeable with encouragement from family on third attempt. Pt needed initial reminder of sternal precautions (assisted to bedside commode), but once reminded, demonstrated good use, able to stand with minimal assistance. Pt ambulated 150 ft on 2L O2, rolling walker, assist x2 (followed with wheelchair), tolerated fair. Pt c/o fatigue, weakness in her legs and nausea, denies DOE, denies dizziness, seated rest x4 due to weakness in her legs, fatigue. Pt needs much encouragement to ambulate, verbalized understanding of importance of ambulation. Pt to recliner after walk, feet elevated, call bell within reach.  Pt c/o continued nausea, gave ice chips, RN notified. Will follow. Keep as x2 for seated rest breaks.  [id number]  Lenna Sciara, RN, BSN 12/05/2014 3:16 PM

## 2014-12-05 NOTE — Progress Notes (Signed)
Bed offer in place from [facility]- pending availability when medically stable. This is bed choice per patient.  [name]. Pauline Good, [place]

## 2014-12-05 NOTE — Progress Notes (Signed)
1320 Came before lunch and pt wanted to eat before walking as she had been nauseated earlier. Came now and pt stated her stomach is hurting and she does not feel she can go. Asked that we follow up in an hour. Graylon Good RN BSN 12/05/2014 1:25 PM

## 2014-12-05 NOTE — Progress Notes (Signed)
BSW intern met with Pt this morning and present bed offers. She prefer [facility] awaiting offer from facility Pt is not medical stable for discharge.   [place] intern  [phone]

## 2014-12-05 NOTE — Progress Notes (Addendum)
[address]       [place] [address]             [phone]      6 Days Post-Op Procedure(s) (LRB): CORONARY ARTERY BYPASS GRAFTING x 5 (LIMA-LAD, SVG-D, SVG-OM1-OM2, SVG-PD) (N/A) TRANSESOPHAGEAL ECHOCARDIOGRAM (TEE) (N/A) Subjective: Feels nauseated, no abdominal pain. Weak but improving  Objective: Vital signs in last 24 hours: Temp:  [97.8 F (36.6 C)-98.5 F (36.9 C)] 97.8 F (36.6 C) (10/25 0435) Pulse Rate:  [97-110] 97 (10/25 0435) Cardiac Rhythm:  [-] Ventricular paced (10/24 1900) Resp:  [20-24] 20 (10/25 0435) BP: (95-126)/(56-74) 126/74 mmHg (10/25 0435) SpO2:  [93 %-100 %] 93 % (10/25 0435) Weight:  [238 lb 1.6 oz (108 kg)] 238 lb 1.6 oz (108 kg) (10/25 0130)  Hemodynamic parameters for last 24 hours:    Intake/Output from previous day: 10/24 0701 - 10/25 0700 In: 360 [P.O.:360] Out: 1150 [Urine:1150] Intake/Output this shift:    General appearance: alert, cooperative and no distress Heart: regular rate and rhythm Lungs: clear to auscultation bilaterally Abdomen: soft, nontender, obese Extremities: + LE pitting edema Wound: incis healing well  Lab Results:  Recent Labs  12/04/14 0357 12/05/14 0404  WBC 9.4 12.5*  HGB 8.0* 8.2*  HCT 25.7* 27.2*  PLT 177 200   BMET:  Recent Labs  12/04/14 0357 12/05/14 0404  NA 139 135  K 3.9 4.0  CL 98* 95*  CO2 34* 31  GLUCOSE 102* 108*  BUN 38* 39*  CREATININE 1.20* 1.30*  CALCIUM 8.3* 8.2*    PT/INR: No results for input(s): LABPROT, INR in the last 72 hours. ABG    Component Value Date/Time   PHART 7.506* 12/03/2014 1136   HCO3 32.0* 12/03/2014 1136   TCO2 33 12/03/2014 1136   ACIDBASEDEF 4.0* 11/29/2014 1541   O2SAT 94.0 12/03/2014 1136   CBG (last 3)   Recent Labs  12/04/14 1630 12/04/14 2029 12/05/14 0603  GLUCAP 102* 101* 101*    Meds Scheduled Meds: . aspirin EC  325 mg Oral Daily   Or  . aspirin  324 mg Per Tube Daily  . bisacodyl  10 mg Oral  Daily   Or  . bisacodyl  10 mg Rectal Daily  . budesonide-formoterol  2 puff Inhalation BID  . docusate sodium  200 mg Oral Daily  . enoxaparin (LOVENOX) injection  40 mg Subcutaneous QHS  . fluticasone  1 spray Each Nare Daily  . furosemide  40 mg Oral BID  . insulin aspart  0-15 Units Subcutaneous TID WC  . insulin aspart  4 Units Subcutaneous TID WC  . insulin detemir  15 Units Subcutaneous Daily  . loratadine  10 mg Oral Daily  . pantoprazole  40 mg Oral Daily  . potassium chloride  20 mEq Oral BID  . pravastatin  80 mg Oral QPM  . sodium chloride  10 mL Intravenous Q12H  . sodium chloride  3 mL Intravenous Q12H   Continuous Infusions: . sodium chloride Stopped (12/01/14 1900)  . sodium chloride Stopped (12/01/14 1900)  . sodium chloride Stopped (12/01/14 1900)  . lactated ringers Stopped (12/03/14 0900)  . phenylephrine (NEO-SYNEPHRINE) Adult infusion Stopped (12/03/14 0900)   PRN Meds:.sodium chloride, sodium chloride, ALPRAZolam, alum & mag hydroxide-simeth, clotrimazole, dicyclomine, guaiFENesin-dextromethorphan, iohexol, levalbuterol, loperamide, magnesium hydroxide, ondansetron (ZOFRAN) IV, oxyCODONE, sodium chloride, sodium chloride, traMADol, zolpidem  Xrays Dg Chest 2 View  12/05/2014  CLINICAL DATA:  Status post coronary artery bypass  grafting EXAM: CHEST  2 VIEW COMPARISON:  December 04, 2014 FINDINGS: Cordis has been removed. No pneumothorax. Areas of patchy airspace consolidation in the left upper and left lower lobe regions remains stable. There is mild atelectatic change in the right base. Heart is upper normal in size with pulmonary vascular within normal limits. Pacemaker leads are unchanged in position. No adenopathy. There is postoperative change in the lower cervical spine. Patient is status post coronary artery bypass grafting. IMPRESSION: No pneumothorax. Stable areas of patchy airspace consolidation on the left. Mild right base atelectasis. No change in cardiac  silhouette. Electronically Signed   By: Lowella Grip III M.D.   On: 12/05/2014 07:38   Dg Chest Port 1 View  12/04/2014  CLINICAL DATA:  Status post coronary bypass grafting EXAM: PORTABLE CHEST - 1 VIEW COMPARISON:  12/03/2014 FINDINGS: Cardiac shadow is enlarged but stable. Postoperative changes are again seen. A defibrillator is again noted and stable. The right jugular sheath is again noted. Persistent left basilar atelectatic changes are seen. No new focal abnormality is noted. IMPRESSION: No change from the prior exam. Electronically Signed   By: Inez Catalina M.D.   On: 12/04/2014 09:44    Assessment/Plan: S/P Procedure(s) (LRB): CORONARY ARTERY BYPASS GRAFTING x 5 (LIMA-LAD, SVG-D, SVG-OM1-OM2, SVG-PD) (N/A) TRANSESOPHAGEAL ECHOCARDIOGRAM (TEE) (N/A)   1 slow but steady progress 2 EP is assisting with pacer 3 v-paced, BP is stable 4 cont BID lasix , watch creat 5 minor leukocytosis- monitor closely, H/H improved 6 sugars controlled well 7 push rehab/pulm toilet as able- SNF at d/c for rehab  LOS: 12 days    GOLD,WAYNE E 12/05/2014  Patient seen and examined, agree with above  Remo Lipps C. Roxan Hockey, MD [facility] [phone]

## 2014-12-05 NOTE — Progress Notes (Signed)
Patient Name: [name] Date of Encounter: 12/05/2014   SUBJECTIVE  Feels nauseated. Breathing improved, however not at baseline. Denies chest pain or sob. PT recommended SNF.   CURRENT MEDS . aspirin EC  325 mg Oral Daily   Or  . aspirin  324 mg Per Tube Daily  . bisacodyl  10 mg Oral Daily   Or  . bisacodyl  10 mg Rectal Daily  . budesonide-formoterol  2 puff Inhalation BID  . docusate sodium  200 mg Oral Daily  . enoxaparin (LOVENOX) injection  40 mg Subcutaneous QHS  . fluticasone  1 spray Each Nare Daily  . furosemide  40 mg Oral BID  . insulin aspart  0-15 Units Subcutaneous TID WC  . insulin aspart  4 Units Subcutaneous TID WC  . insulin detemir  15 Units Subcutaneous Daily  . loratadine  10 mg Oral Daily  . pantoprazole  40 mg Oral Daily  . potassium chloride  20 mEq Oral BID  . pravastatin  80 mg Oral QPM  . sodium chloride  10 mL Intravenous Q12H  . sodium chloride  3 mL Intravenous Q12H    OBJECTIVE  Filed Vitals:   12/04/14 2030 12/05/14 0130 12/05/14 0435 12/05/14 0955  BP:   126/74   Pulse:   97   Temp:   97.8 F (36.6 C)   TempSrc:   Oral   Resp:   20   Height:      Weight:  238 lb 1.6 oz (108 kg)    SpO2: 100%  93% 94%    Intake/Output Summary (Last 24 hours) at 12/05/14 1025 Last data filed at 12/05/14 0900  Gross per 24 hour  Intake    840 ml  Output   1050 ml  Net   -210 ml   Filed Weights   12/03/14 0600 12/04/14 0600 12/05/14 0130  Weight: 249 lb 5.4 oz (113.1 kg) 251 lb 5.2 oz (114 kg) 238 lb 1.6 oz (108 kg)    PHYSICAL EXAM  General: Pleasant, NAD. Neuro: Alert and oriented X 3. Moves all extremities spontaneously. Psych: Normal affect. HEENT:  Normal  Neck: Supple without bruits or JVD. Right IJ in place.  Lungs:  Resp regular and unlabored. Diminished breath sound at base.  Heart: RRR no s3, s4. Soft systolic murmurs. Abdomen: Soft, non-tender, non-distended, BS + x 4.  Extremities: No clubbing, cyanosis. Mile [name]  BL  edema. DP/PT/Radials 2+ and equal bilaterally. Skin: Multiple bruise  Accessory Clinical Findings  CBC  Recent Labs  12/04/14 0357 12/05/14 0404  WBC 9.4 12.5*  HGB 8.0* 8.2*  HCT 25.7* 27.2*  MCV 91.1 92.5  PLT 177 [id number]   Basic Metabolic Panel  Recent Labs  12/04/14 0357 12/05/14 0404  NA 139 135  K 3.9 4.0  CL 98* 95*  CO2 34* 31  GLUCOSE 102* 108*  BUN 38* 39*  CREATININE 1.20* 1.30*  CALCIUM 8.3* 8.2*   Liver Function Tests  Recent Labs  12/03/14 1045  AST 78*  ALT 125*  ALKPHOS 172*  BILITOT 1.3*  PROT 5.7*  ALBUMIN 2.7*    TELE  Paced rhythm Rad ASSESSMENT AND PLAN  1. CAD s/p CABG x5; 11/29/14.  - No CP. Doing well.  2. Volume overload/diastolic CHF  - On lasix 40 mg bid po. Creatinine increased to 1.3 with BUN of 39. Net I/O negative 751ml yesterday. Management per MD.   3. S/p remote BiV pacer with malfunction of A lead  and chronic penetration of V lead into pericardium. EP following.   4. Renal insufficiency: Cr worsen to 1.3  5. OSA on CPAP  Signed, Bhagat,Bhavinkumar PA-C Pager [phone]  Patient seen and examined and history reviewed. Agree with above findings and plan. Main complaint is of nausea. Bowels moving [place]. Most likely due to pain meds. Volume status improved. Will need to watch renal function closely. Dr. Lovena [name] to decide management of pacer leads.  [name], [place] 12/05/2014 11:33 AM

## 2014-12-06 LAB — URINALYSIS, ROUTINE W REFLEX MICROSCOPIC
Bilirubin Urine: NEGATIVE
Glucose, UA: NEGATIVE mg/dL
Hgb urine dipstick: NEGATIVE
Ketones, ur: NEGATIVE mg/dL
Leukocytes, UA: NEGATIVE
Nitrite: NEGATIVE
Protein, ur: NEGATIVE mg/dL
Specific Gravity, Urine: 1.015 (ref 1.005–1.030)
Urobilinogen, UA: 0.2 mg/dL (ref 0.0–1.0)
pH: 5.5 (ref 5.0–8.0)

## 2014-12-06 LAB — BASIC METABOLIC PANEL
Anion gap: 12 (ref 5–15)
BUN: 37 mg/dL — ABNORMAL HIGH (ref 6–20)
CO2: 30 mmol/L (ref 22–32)
Calcium: 8.8 mg/dL — ABNORMAL LOW (ref 8.9–10.3)
Chloride: 95 mmol/L — ABNORMAL LOW (ref 101–111)
Creatinine, Ser: 1.41 mg/dL — ABNORMAL HIGH (ref 0.44–1.00)
GFR calc Af Amer: 43 mL/min — ABNORMAL LOW (ref >60)
GFR calc non Af Amer: 37 mL/min — ABNORMAL LOW (ref >60)
Glucose, Bld: 136 mg/dL — ABNORMAL HIGH (ref 65–99)
Potassium: 4.2 mmol/L (ref 3.5–5.1)
Sodium: 137 mmol/L (ref 135–145)

## 2014-12-06 LAB — GLUCOSE, CAPILLARY
Glucose-Capillary: 100 mg/dL — ABNORMAL HIGH (ref 65–99)
Glucose-Capillary: 123 mg/dL — ABNORMAL HIGH (ref 65–99)
Glucose-Capillary: 111 mg/dL — ABNORMAL HIGH (ref 65–99)
Glucose-Capillary: 138 mg/dL — ABNORMAL HIGH (ref 65–99)

## 2014-12-06 LAB — CBC
HCT: 27.9 % — ABNORMAL LOW (ref 36.0–46.0)
Hemoglobin: 8.5 g/dL — ABNORMAL LOW (ref 12.0–15.0)
MCH: 28.8 pg (ref 26.0–34.0)
MCHC: 30.5 g/dL (ref 30.0–36.0)
MCV: 94.6 fL (ref 78.0–100.0)
Platelets: 241 10*3/uL (ref 150–400)
RBC: 2.95 MIL/uL — ABNORMAL LOW (ref 3.87–5.11)
RDW: 15.3 % (ref 11.5–15.5)
WBC: 14.1 10*3/uL — ABNORMAL HIGH (ref 4.0–10.5)

## 2014-12-06 MED ORDER — ONDANSETRON HCL 4 MG PO TABS: 8.0000 mg | ORAL_TABLET | Freq: Three times a day (TID) | ORAL | Status: DC | PRN

## 2014-12-06 MED ORDER — FUROSEMIDE 40 MG PO TABS
40.0000 mg | ORAL_TABLET | Freq: Every day | ORAL | Status: DC
  Administered 2014-12-07: 40 mg via ORAL
  Filled 2014-12-06: qty 1

## 2014-12-06 MED ORDER — BISACODYL 5 MG PO TBEC: 10.0000 mg | DELAYED_RELEASE_TABLET | Freq: Every day | ORAL | Status: DC | PRN

## 2014-12-06 MED ORDER — BISACODYL 10 MG RE SUPP: 10.0000 mg | Freq: Every day | RECTAL | Status: DC | PRN

## 2014-12-06 NOTE — Progress Notes (Signed)
[address]       [place] [address]             [phone]              Discharge Summary  Name: NAVIRA PETRASH DOB: January 17, 1946 69 y.o. MRN: [id number]   Admission Date: 11/23/2014 Discharge Date: 12/07/2014    Admitting Diagnosis: Chest pain   Discharge Diagnosis:  Severe 3 vessel coronary artery disease Unstable angina Expected postoperative blood loss anemia Postoperative deconditioning  Past Medical History  Diagnosis Date  . Asthma   . Hypertension   . Arthritis   . GERD (gastroesophageal reflux disease)   . Diverticulosis   . Anemia   . CAD (coronary artery disease)   . Fatty liver disease, nonalcoholic   . Internal hemorrhoids   . Gastritis   . Hyperplastic colon polyp   . OSA (obstructive sleep apnea)   . ICD (implantable cardiac defibrillator) in place   . Shortness of breath   . IBS (irritable bowel syndrome)   . H/O hiatal hernia   . Obesity   . Diabetes mellitus without complication [facility])    Patient Active Problem List   Diagnosis Date Noted  . S/P CABG x 5 11/29/2014  . CAD, multiple vessel   . Acute diastolic HF (heart failure) ([place]) 11/23/2014  . Diarrhea 07/12/2014  . Generalized abdominal pain 07/12/2014  . Diastolic CHF, acute on chronic (HCC) 11/04/2013  . Mixed hypercholesterolemia and hypertriglyceridemia 04/22/2013  . Vertigo 04/22/2013  . Dyspnea 08/25/2012  . Allergic to IV contrast 07/23/2012  . Unstable angina ([place]) 07/22/2012  . Morbid obesity ([place]) 11/22/2011  . Cardiomyopathy, nonischemic, Bi V responder,  EF 65-70% echo Oct 2015 11/22/2011  . GERD (gastroesophageal reflux disease) 11/22/2011  . Back pain 11/22/2011  . OSA (obstructive sleep apnea) 11/22/2011  . HEMORRHOIDS-EXTERNAL 02/21/2010  . NAUSEA 02/21/2010  . ABDOMINAL PAIN -GENERALIZED 02/21/2010  . PERSONAL HX COLONIC POLYPS 02/21/2010  . ANEMIA 04/16/2007  . Essential hypertension 04/16/2007  . Coronary atherosclerosis  04/16/2007  . DIVERTICULOSIS, COLON 04/16/2007  . ARTHRITIS 04/16/2007  . INTERNAL HEMORRHOIDS WITHOUT MENTION COMP 04/12/2007  . Asthma 04/12/2007  . Cardiac resynchronization therapy pacemaker (CRT-P) in place 04/12/2007  . ACUTE GASTRITIS WITHOUT MENTION OF HEMORRHAGE 12/14/2006  . COLONIC POLYPS, HYPERPLASTIC 09/27/2002  . FATTY LIVER DISEASE 02/16/2002     Procedures: CORONARY ARTERY BYPASS GRAFTING x 5  - 11/29/2014  Left internal mammary artery to left anterior descending   Saphenous vein graft to first diagonal  Sequential saphenous vein graft to obtuse marginals 1-2  Saphenous vein graft to posterior descending  Endoscopic greater saphenous vein harvest right leg and left thigh    HPI:  The patient is a 69 y.o. female with a complicated past medical history who presented on the date of admission with a 2-3 week history of shortness of breath and chest pain which associated with exertion. That morning, she was awakened from sleep at 0330 with acute pain and associated lower extremity edema and dyspnea. She was seen in the cardiology office and subsequently admitted for further workup.     Hospital Course:  The patient was admitted to [facility] on 11/23/2014.  She ruled out for myocardial infarction and BNP was 68. An echocardiogram was performed which showed an EF 55-60% with grade I diastolic dysfunction.  Cardiac catheterization was then performed which revealed 3 vessel coronary artery disease.  A cardiac surgery was consulted and  Dr. Roxan Hockey saw the patient and reviewed her films.  It was felt that she would benefit from CABG.  All risks, benefits and alternatives of surgery were explained in detail, and the patient agreed to proceed.   The patient was taken to the operating room and underwent the above procedure.  Intraoperatively, the RV pacemaker lead was noted to be through the wall of the pericardium and could not be repositioned in the heart, therefore it was  clipped.  The patient was extubated on postop day 1. She was treated with aggressive pulmonary toilet measures and diuresis, and also required Bipap for hypoxemia and hypercarbia.  She remained on pressors but was hemodynamically stable. She made slow but steady improvement and was weaned from all pressors and ready for transfer to stepdown by postop day 4.    Electrophysiology was consulted for recommendations about her pacemaker lead. It was felt that since her rhythm has remained stable, no further intervention will be necessary at this point and this will be followed as an outpatient.  Her blood pressures have been low normal but stable.  She has been aggressively diuresed but remains volume overloaded with edema on physical exam. Incisions are healing well.  Blood sugars have been controlled on low dose Levemir. She will need outpatient follow up of her hemoglobin A1C of 6.7. She has been working with physical therapy and cardiac rehab on ambulation and mobility but remains deconditioned. It is felt that the patient will require short term SNF placement post-discharge and this is being arranged by social work.  She presently is medically stable on today's date for transfer once a bed is available.      Recent vital signs:  Filed Vitals:   12/07/14 0925  BP:   Pulse: 98  Temp:   Resp: 18    Recent laboratory studies:  CBC:  Recent Labs  12/05/14 0404 12/06/14 0215  WBC 12.5* 14.1*  HGB 8.2* 8.5*  HCT 27.2* 27.9*  PLT 200 241   BMET:   Recent Labs  12/06/14 0215 12/07/14 0631  NA 137 135  K 4.2 4.1  CL 95* 96*  CO2 30 32  GLUCOSE 136* 124*  BUN 37* 34*  CREATININE 1.41* 1.36*  CALCIUM 8.8* 8.3*    PT/INR: No results for input(s): LABPROT, INR in the last 72 hours.   Discharge Medications:     Medication List    STOP taking these medications        amLODipine 10 MG tablet  Commonly known as:  NORVASC     aspirin 81 MG chewable tablet  Replaced by:  aspirin  325 MG EC tablet     BIOTIN PO     bisoprolol 5 MG tablet  Commonly known as:  ZEBETA     isosorbide mononitrate 30 MG 24 hr tablet  Commonly known as:  IMDUR     losartan 100 MG tablet  Commonly known as:  COZAAR     metolazone 2.5 MG tablet  Commonly known as:  ZAROXOLYN     nitroGLYCERIN 0.4 MG SL tablet  Commonly known as:  NITROSTAT     spironolactone 25 MG tablet  Commonly known as:  ALDACTONE      TAKE these medications        albuterol 108 (90 BASE) MCG/ACT inhaler  Commonly known as:  PROVENTIL HFA;VENTOLIN HFA  Inhale 2 puffs into the lungs every 4 (four) hours as needed for wheezing (wheezing).     aspirin 325 MG  EC tablet  Take 1 tablet (325 mg total) by mouth daily.     carvedilol 3.125 MG tablet  Commonly known as:  COREG  Take 1 tablet (3.125 mg total) by mouth 2 (two) times daily with a meal.     cetirizine 10 MG tablet  Commonly known as:  ZYRTEC  Take 10 mg by mouth daily.     clotrimazole 10 MG troche  Commonly known as:  MYCELEX  Take 10 mg by mouth every 4 (four) hours as needed (mouth pain).     dicyclomine 20 MG tablet  Commonly known as:  BENTYL  Take 1 tablet (20 mg total) by mouth 2 (two) times daily as needed (pain).     docusate sodium 100 MG capsule  Commonly known as:  COLACE  Take 1 capsule (100 mg total) by mouth daily.     fluticasone 50 MCG/ACT nasal spray  Commonly known as:  FLONASE  Place 1 spray into the nose 2 (two) times daily as needed for rhinitis or allergies.     furosemide 40 MG tablet  Commonly known as:  LASIX  Take 1 tablet (40 mg total) by mouth daily.     insulin aspart 100 UNIT/ML injection  Commonly known as:  novoLOG  Inject 4 Units into the skin 3 (three) times daily with meals.     insulin detemir 100 UNIT/ML injection  Commonly known as:  LEVEMIR  Inject 0.15 mLs (15 Units total) into the skin daily.     loperamide 2 MG tablet  Commonly known as:  IMODIUM A-D  Take 2 mg by mouth 4 (four)  times daily as needed for diarrhea or loose stools (diarrhea).     omeprazole 40 MG capsule  Commonly known as:  PRILOSEC  Take 40 mg by mouth daily.     ondansetron 8 MG tablet  Commonly known as:  ZOFRAN  Take 1 tablet (8 mg total) by mouth every 8 (eight) hours as needed for nausea or vomiting.     ONE TOUCH ULTRA TEST test strip  Generic drug:  glucose blood  as directed.     ONETOUCH DELICA LANCETS [id number] Misc  as directed.     oxyCODONE 5 MG immediate release tablet  Commonly known as:  Oxy IR/ROXICODONE  Take 1-2 tablets (5-10 mg total) by mouth every 3 (three) hours as needed for severe pain.     potassium chloride SA 20 MEQ tablet  Commonly known as:  K-DUR,KLOR-CON  Take 20 mEq by mouth daily.     pravastatin 80 MG tablet  Commonly known as:  PRAVACHOL  Take 1 tablet (80 mg total) by mouth every evening.     SYMBICORT 160-4.5 MCG/ACT inhaler  Generic drug:  budesonide-formoterol  Inhale 2 puffs into the lungs 2 (two) times daily.     triamcinolone cream 0.1 %  Commonly known as:  KENALOG  Apply 1 application topically daily.         Discharge Instructions:   1. Please obtain vital signs at least one time daily 2. Please weigh the patient daily. If he or she continues to gain weight or develops lower extremity edema, contact the office at [phone]. 3. Ambulate patient at least three times daily and please use sternal precautions. 4. The patient is to refrain from driving, heavy lifting or strenuous activity.   5. May shower daily and clean incisions with soap and water.   6. May resume regular diet.   Follow Up: Follow-up  Information    Follow up with Melrose Nakayama, MD On 01/02/2015.   Specialty:  Cardiothoracic Surgery   Why:  Have a chest x-ray at [place] at 9:30, then see MD at 10:30   Contact information:   [place] [address] [phone]       Follow up with Sanda Klein, MD On 12/22/2014.    Specialty:  Cardiology   Why:  Appointment is at 9:30   Contact information:   [address] [place] [address] [phone]       Discharge Instructions    Amb Referral to Cardiac Rehabilitation    Complete by:  As directed   Diagnosis:  CABG            The patient has been discharged on:  1.Beta Blocker: Yes [ x ]  No [  ]  If No, reason:    2.Ace Inhibitor/ARB: Yes [ ]   No [ x ]  If No, reason: Hypotension, elevated creatinine   3.Statin: Yes [ x ]  No [ ]   If No, reason:    4.Ecasa: Yes [ x ]  No [ ]   If No, reason:    Marykate Heuberger H 12/07/2014, 9:38 AM

## 2014-12-06 NOTE — Progress Notes (Signed)
Patient continues to decline the use of nocturnal CPAP. Equipment remains at bedside. She is aware that she may contact RT if she should need further assistance. RT will continue to follow.

## 2014-12-06 NOTE — Progress Notes (Addendum)
       [address]       [place] [address]             [phone]          7 Days Post-Op Procedure(s) (LRB): CORONARY ARTERY BYPASS GRAFTING x 5 (LIMA-LAD, SVG-D, SVG-OM1-OM2, SVG-PD) (N/A) TRANSESOPHAGEAL ECHOCARDIOGRAM (TEE) (N/A)  Subjective: Still having some abdominal discomfort, but eating better this am. +BM.   Objective: Vital signs in last 24 hours: Patient Vitals for the past 24 hrs:  BP Temp Temp src Pulse Resp SpO2 Height Weight  12/06/14 [id number] - - - - - - 5\' 4"  (1.626 m) 250 lb 7.1 oz (113.6 kg)  12/06/14 0532 (!) 124/50 mmHg 97.8 F (36.6 C) Oral 78 18 93 % - -  12/06/14 0513 130/69 mmHg 98.2 F (36.8 C) Oral 96 18 100 % - -  12/05/14 2059 (!) 142/69 mmHg 99.1 F (37.3 C) Oral (!) 107 18 91 % - -  12/05/14 1620 98/72 mmHg - Oral (!) 106 19 100 % - -  12/05/14 0955 - - - - - 94 % - -   Current Weight  12/06/14 250 lb 7.1 oz (113.6 kg)  PRE-OPERATIVE WEIGHT: 110 kg   Intake/Output from previous day: 10/25 0701 - 10/26 0700 In: 720 [P.O.:720] Out: 450 [Urine:450]  CBGs 95-136-100  PHYSICAL EXAM:  Heart: RRR, paced Lungs: Slightly decreased BS bilaterally Wound: Clean and dry Extremities: Mild LE edema, ecchymosis    Lab Results: CBC: Recent Labs  12/05/14 0404 12/06/14 0215  WBC 12.5* 14.1*  HGB 8.2* 8.5*  HCT 27.2* 27.9*  PLT 200 241   BMET:  Recent Labs  12/05/14 0404 12/06/14 0215  NA 135 137  K 4.0 4.2  CL 95* 95*  CO2 31 30  GLUCOSE 108* 136*  BUN 39* 37*  CREATININE 1.30* 1.41*  CALCIUM 8.2* 8.8*    PT/INR: No results for input(s): LABPROT, INR in the last 72 hours.    Assessment/Plan: S/P Procedure(s) (LRB): CORONARY ARTERY BYPASS GRAFTING x 5 (LIMA-LAD, SVG-D, SVG-OM1-OM2, SVG-PD) (N/A) TRANSESOPHAGEAL ECHOCARDIOGRAM (TEE) (N/A)  CV- EP following for pacer. BPs generally stable and trending up.  Pulm- continue pulm toilet, wean O2 as able.  Vol overload/diastolic HF- continue diuresis and  watch  Cr.  Renal- Cr up slightly this am, will follow.  Expected postop blood loss anemia- H/H stable.  Endocrine- CBGs stable on Levemir, A1C=6.7.  Deconditioning- Continue PT/CRPI  Disp- possibly to SNF in am if remains stable.   LOS: 13 days    COLLINS,GINA H 12/06/2014  Patient seen and examined, agree with above Dc external pacing wires Baseline creatinine is 1.5, she is still below that  [employer]. Roxan Hockey, MD [facility] [phone]

## 2014-12-06 NOTE — Progress Notes (Signed)
Patient Name: [name] Date of Encounter: 12/06/2014   SUBJECTIVE  Feeling well. No chest pain, sob or palpitations. Walking in hallway without any discomfort.    CURRENT MEDS . aspirin EC  325 mg Oral Daily   Or  . aspirin  324 mg Per Tube Daily  . budesonide-formoterol  2 puff Inhalation BID  . enoxaparin (LOVENOX) injection  40 mg Subcutaneous QHS  . fluticasone  1 spray Each Nare Daily  . furosemide  40 mg Oral BID  . insulin aspart  0-15 Units Subcutaneous TID WC  . insulin aspart  4 Units Subcutaneous TID WC  . insulin detemir  15 Units Subcutaneous Daily  . loratadine  10 mg Oral Daily  . pantoprazole  40 mg Oral Daily  . potassium chloride  20 mEq Oral BID  . pravastatin  80 mg Oral QPM  . sodium chloride  10 mL Intravenous Q12H  . sodium chloride  3 mL Intravenous Q12H    OBJECTIVE  Filed Vitals:   12/05/14 2059 12/06/14 0513 12/06/14 0532 12/06/14 0632  BP: 142/69 130/69 124/50   Pulse: 107 96 78   Temp: 99.1 F (37.3 C) 98.2 F (36.8 C) 97.8 F (36.6 C)   TempSrc: Oral Oral Oral   Resp: 18 18 18    Height:    5\' 4"  (1.626 m)  Weight:    250 lb 7.1 oz (113.6 kg)  SpO2: 91% 100% 93%     Intake/Output Summary (Last 24 hours) at 12/06/14 1052 Last data filed at 12/06/14 0516  Gross per 24 hour  Intake    240 ml  Output    450 ml  Net   -210 ml   Filed Weights   12/04/14 0600 12/05/14 0130 12/06/14 0632  Weight: 251 lb 5.2 oz (114 kg) 238 lb 1.6 oz (108 kg) 250 lb 7.1 oz (113.6 kg)    PHYSICAL EXAM  General: Pleasant, NAD. Neuro: Alert and oriented X 3. Moves all extremities spontaneously. Psych: Normal affect. HEENT:  Normal  Neck: Supple without bruits or JVD.  Lungs:  Resp regular and unlabored. Diminished breath sound at base.  Heart: RRR no s3, s4. Soft systolic murmurs. Abdomen: Soft, non-tender, non-distended, BS + x 4.  Extremities: No clubbing, cyanosis. [place] LE BL  edema. DP/PT/Radials 2+ and equal bilaterally. Skin:  Multiple bruise  Accessory Clinical Findings  CBC  Recent Labs  12/05/14 0404 12/06/14 0215  WBC 12.5* 14.1*  HGB 8.2* 8.5*  HCT 27.2* 27.9*  MCV 92.5 94.6  PLT 200 [id number]   Basic Metabolic Panel  Recent Labs  12/05/14 0404 12/06/14 0215  NA 135 137  K 4.0 4.2  CL 95* 95*  CO2 31 30  GLUCOSE 108* 136*  BUN 39* 37*  CREATININE 1.30* 1.41*  CALCIUM 8.2* 8.8*   Liver Function Tests No results for input(s): AST, ALT, ALKPHOS, BILITOT, PROT, ALBUMIN in the last 72 hours.  TELE  V Paced rhythm Rad ASSESSMENT AND PLAN  1. CAD s/p CABG x5; 11/29/14.  - No CP. Doing well.  2. Volume overload/diastolic CHF  - On lasix 40 mg bid po. Creatinine increased further to 1.41 from 1.3.  Net I/O +270 yesterday. Will reduced lasix to 40mg  daily. BEMT tomorrow.   3. S/p remote BiV pacer with malfunction of A lead and chronic penetration of V lead into pericardium. EP following. No plan for any intervention per Chanetta Marshall, NP.   4. Renal insufficiency: Cr worsen to 1.41. As  above.   5. OSA on CPAP  Signed, Bhagat,Bhavinkumar PA-C Pager [phone]    Patient seen and examined and history reviewed. Agree with above findings and plan. Doing very well with ambulation. Some chest wall soreness. Creatinine increasing so will reduce lasix to once a day. Anticipate DC to Rehab tomorrow.   [name], [place] 12/06/2014 11:10 AM

## 2014-12-06 NOTE — Progress Notes (Signed)
EPWs DC'd per order and unit protocol.  Pt tolerated very well.  All tips intact, no oozing, sites painted.  Pt understands bed rest for 1 hr.  CCMD notified.  VSS stable see flow sheet.  Will monitor closely.

## 2014-12-06 NOTE — Progress Notes (Signed)
CARDIAC REHAB PHASE I   PRE:  Rate/Rhythm: 95 paced  BP:  Sitting: 128/85        SaO2: 95 2L  MODE:  Ambulation: 150 ft   POST:  Rate/Rhythm: 120 paced  BP:  Sitting: 133/99         SaO2: 93 2L  Went to see pt this am, asked to return after 2pm. Returned to pt lying in bed, states she fell asleep after lunch. Pt needing much encouragement to walk/OOB. Pt needing cues for sternal precautions, stood with minimal assistance. Pt ambulated 150 ft on 2L O2, rolling walker, assist x1, fairly steady gait, tolerated fair. Pt c/o DOE, fatigue, leg weakness, denies CP, dizziness, brief standing rest x4, declined to increase distance. VSS. Pt assisted to bedside commode, then to bed for EPW removal. Pt states she is anxious about being discharged, states "I'm not sure how things will go." Emotional support given to pt. Pt in bed, call bell within reach. Will follow-up tomorrow.   [id number]  Lenna Sciara, RN, BSN 12/06/2014 2:49 PM

## 2014-12-06 NOTE — Progress Notes (Signed)
Physical Therapy Treatment Patient Details Name: [name] MRN: [id number] DOB: 05/17/1945 Today's Date: 12/06/2014    History of Present Illness pt is a 69 y.o. female, PMHx of CHF, pacer, sleep apnea and [place] admitted with progressive SOB and chest pain.  Per CATH 3-vessel ds, s/p CABG x5.    PT Comments    Improving well.  Still very deconditioned with complaint of legs fatiguing quickly.  Follow Up Recommendations  SNF;Supervision/Assistance - 24 hour     Equipment Recommendations       Recommendations for Other Services       Precautions / Restrictions Precautions Precautions: Fall;Sternal Precaution Comments: educated on sternal precautions Restrictions Weight Bearing Restrictions: Yes (sternal precautions)    Mobility  Bed Mobility Overal bed mobility: Needs Assistance Bed Mobility: Rolling;Sidelying to Sit Rolling: Supervision Sidelying to sit: Supervision       General bed mobility comments: fluid, but with a little too much L UE push.  HOB raised.  Transfers Overall transfer level: Needs assistance Equipment used: None;1 person hand held assist Transfers: Sit to/from Stand Sit to Stand: Supervision Stand pivot transfers: Supervision       General transfer comment: cued for sternal precautions  Ambulation/Gait Ambulation/Gait assistance: Min assist;Min guard Ambulation Distance (Feet): 160 Feet (with 2 standing rest breaks.) Assistive device: None Gait Pattern/deviations: Step-through pattern;Decreased step length - right;Decreased step length - left Gait velocity: slow   General Gait Details: Steadiness continues to improve.  Pt generally steady, but with a few episodes of scissoring or wandering to catch balance.   Stairs            Wheelchair Mobility    Modified Rankin (Stroke Patients Only)       Balance Overall balance assessment: Needs assistance Sitting-balance support: No upper extremity supported Sitting  balance-Leahy Scale: Good     Standing balance support: No upper extremity supported Standing balance-Leahy Scale: Fair                      Cognition Arousal/Alertness: Awake/alert Behavior During Therapy: WFL for tasks assessed/performed Overall Cognitive Status: Within Functional Limits for tasks assessed                      Exercises      General Comments General comments (skin integrity, edema, etc.): sats on RA in mid 90's and EHR in the 100's overall during gait      Pertinent Vitals/Pain Pain Assessment: No/denies pain    Home Living                      Prior Function            PT Goals (current goals can now be found in the care plan section) Acute Rehab PT Goals Patient Stated Goal: be independent PT Goal Formulation: With patient Time For Goal Achievement: 12/07/14 Potential to Achieve Goals: Good Progress towards PT goals: Progressing toward goals    Frequency  Min 3X/week    PT Plan Current plan remains appropriate    Co-evaluation             End of Session   Activity Tolerance: Patient tolerated treatment well;Patient limited by fatigue Patient left: Other (comment);with family/visitor present (sitting EOB)     Time: [id number] PT Time Calculation (min) (ACUTE ONLY): 24 min  Charges:  $Gait Training: 8-22 mins $Therapeutic Activity: 8-22 mins  G Codes:      Jeniel Slauson, Tessie Fass 12/06/2014, 11:36 AM

## 2014-12-07 LAB — BASIC METABOLIC PANEL
Anion gap: 7 (ref 5–15)
BUN: 34 mg/dL — ABNORMAL HIGH (ref 6–20)
CO2: 32 mmol/L (ref 22–32)
Calcium: 8.3 mg/dL — ABNORMAL LOW (ref 8.9–10.3)
Chloride: 96 mmol/L — ABNORMAL LOW (ref 101–111)
Creatinine, Ser: 1.36 mg/dL — ABNORMAL HIGH (ref 0.44–1.00)
GFR calc Af Amer: 45 mL/min — ABNORMAL LOW (ref >60)
GFR calc non Af Amer: 38 mL/min — ABNORMAL LOW (ref >60)
Glucose, Bld: 124 mg/dL — ABNORMAL HIGH (ref 65–99)
Potassium: 4.1 mmol/L (ref 3.5–5.1)
Sodium: 135 mmol/L (ref 135–145)

## 2014-12-07 LAB — GLUCOSE, CAPILLARY
Glucose-Capillary: 110 mg/dL — ABNORMAL HIGH (ref 65–99)
Glucose-Capillary: 126 mg/dL — ABNORMAL HIGH (ref 65–99)

## 2014-12-07 MED ORDER — INSULIN ASPART 100 UNIT/ML ~~LOC~~ SOLN: 4.0000 [IU] | Freq: Three times a day (TID) | SUBCUTANEOUS | Status: DC

## 2014-12-07 MED ORDER — CARVEDILOL 3.125 MG PO TABS
3.1250 mg | ORAL_TABLET | Freq: Two times a day (BID) | ORAL | Status: DC
  Filled 2014-12-07: qty 1

## 2014-12-07 MED ORDER — FUROSEMIDE 40 MG PO TABS: 40.0000 mg | ORAL_TABLET | Freq: Every day | ORAL | Status: DC

## 2014-12-07 MED ORDER — OXYCODONE HCL 5 MG PO TABS: 5.0000 mg | ORAL_TABLET | ORAL | Status: DC | PRN

## 2014-12-07 MED ORDER — ASPIRIN 325 MG PO TBEC: 325.0000 mg | DELAYED_RELEASE_TABLET | Freq: Every day | ORAL | Status: DC

## 2014-12-07 MED ORDER — CARVEDILOL 3.125 MG PO TABS: 3.1250 mg | ORAL_TABLET | Freq: Two times a day (BID) | ORAL | Status: DC

## 2014-12-07 MED ORDER — INSULIN DETEMIR 100 UNIT/ML ~~LOC~~ SOLN: 15.0000 [IU] | Freq: Every day | SUBCUTANEOUS | Status: DC

## 2014-12-07 NOTE — Progress Notes (Addendum)
       [address]       [place] [address]             [phone]          8 Days Post-Op Procedure(s) (LRB): CORONARY ARTERY BYPASS GRAFTING x 5 (LIMA-LAD, SVG-D, SVG-OM1-OM2, SVG-PD) (N/A) TRANSESOPHAGEAL ECHOCARDIOGRAM (TEE) (N/A)  Subjective: Feels better today. Appetite improving, no nausea and decreased abdominal discomfort. Getting ready to walk.   Objective: Vital signs in last 24 hours: Patient Vitals for the past 24 hrs:  BP Temp Temp src Pulse Resp SpO2 Weight  12/07/14 0434 (!) 100/55 mmHg 97.9 F (36.6 C) Oral (!) 102 16 94 % 252 lb 10.4 oz (114.6 kg)  12/06/14 2152 - - - - - 90 % -  12/06/14 2132 - - - - - 98 % -  12/06/14 2012 104/64 mmHg 98.2 F (36.8 C) Oral (!) 107 18 100 % -  12/06/14 1600 (!) 135/57 mmHg - - - - - -  12/06/14 1545 124/68 mmHg - - - - - -  12/06/14 1530 108/69 mmHg - - - - - -  12/06/14 1509 114/87 mmHg - - - - - -   Current Weight  12/07/14 252 lb 10.4 oz (114.6 kg)  PRE-OPERATIVE WEIGHT: 110 kg   Intake/Output from previous day: 10/26 0701 - 10/27 0700 In: 1080 [P.O.:1080] Out: 300 [Urine:300]  CBGs 111-138-110-124   PHYSICAL EXAM:  Heart: RRR, paced Lungs: Slightly diminished in bases Wound: Clean and dry Extremities: Mild LE edema, resolving ecchymosis    Lab Results: CBC: Recent Labs  12/05/14 0404 12/06/14 0215  WBC 12.5* 14.1*  HGB 8.2* 8.5*  HCT 27.2* 27.9*  PLT 200 241   BMET:  Recent Labs  12/06/14 0215 12/07/14 0631  NA 137 135  K 4.2 4.1  CL 95* 96*  CO2 30 32  GLUCOSE 136* 124*  BUN 37* 34*  CREATININE 1.41* 1.36*  CALCIUM 8.8* 8.3*    PT/INR: No results for input(s): LABPROT, INR in the last 72 hours.    Assessment/Plan: S/P Procedure(s) (LRB): CORONARY ARTERY BYPASS GRAFTING x 5 (LIMA-LAD, SVG-D, SVG-OM1-OM2, SVG-PD) (N/A) TRANSESOPHAGEAL ECHOCARDIOGRAM (TEE) (N/A)  CV- EP following for pacer. BPs stable.  Pulm- continue pulm toilet, wean O2 as  able.  Vol overload/diastolic HF- continue diuresis.  Renal- Cr below baseline, decreasing.  Expected postop blood loss anemia- H/H stable.  Endocrine- CBGs stable on Levemir, A1C=6.7.  Deconditioning- Continue PT/CRPI  Disp- possibly to SNF today if bed available.    LOS: 14 days    COLLINS,GINA H 12/07/2014  Patient seen and examined, agree with above To SNF today  Remo Lipps C. Roxan Hockey, MD [facility] [phone]

## 2014-12-07 NOTE — Progress Notes (Signed)
Patient will discharge to [facility] Anticipated discharge date:12/07/14 Family notified:pt to notify Transportation by PTAR- scheduled at 2:15pm  CSW signing off.  Domenica Reamer, [place] Social Worker [phone]

## 2014-12-07 NOTE — Discharge Summary (Signed)
[address]       [place] [address]             [id number]            Discharge Summary  Name: [name] DOB: Mar 27, 1945 69 y.o. MRN: [id number]   Admission Date: 11/23/2014 Discharge Date: 12/07/2014    Admitting Diagnosis: Chest pain   Discharge Diagnosis:  Severe 3 vessel coronary artery disease Unstable angina Expected postoperative blood loss anemia Postoperative deconditioning  Past Medical History  Diagnosis Date  . Asthma   . Hypertension   . Arthritis   . GERD (gastroesophageal reflux disease)   . Diverticulosis   . Anemia   . CAD (coronary artery disease)   . Fatty liver disease, nonalcoholic   . Internal hemorrhoids   . Gastritis   . Hyperplastic colon polyp   . OSA (obstructive sleep apnea)   . ICD (implantable cardiac defibrillator) in place   . Shortness of breath   . IBS (irritable bowel syndrome)   . H/O hiatal hernia   . Obesity   . Diabetes mellitus without complication [facility])    Patient Active Problem List   Diagnosis Date Noted  . S/P CABG x 5 11/29/2014  . CAD, multiple vessel   . Acute diastolic HF (heart failure) ([place]) 11/23/2014  . Diarrhea 07/12/2014  . Generalized abdominal pain 07/12/2014  . Diastolic CHF, acute on chronic (HCC) 11/04/2013  . Mixed hypercholesterolemia and hypertriglyceridemia 04/22/2013  . Vertigo 04/22/2013  . Dyspnea 08/25/2012  . Allergic to IV contrast 07/23/2012  . Unstable angina ([place]) 07/22/2012  . Morbid obesity ([place]) 11/22/2011  . Cardiomyopathy, nonischemic, Bi V responder,  EF 65-70% echo Oct 2015 11/22/2011  . GERD (gastroesophageal reflux disease) 11/22/2011  . Back pain 11/22/2011  . OSA (obstructive sleep apnea) 11/22/2011  . HEMORRHOIDS-EXTERNAL 02/21/2010  . NAUSEA 02/21/2010  . ABDOMINAL PAIN -GENERALIZED 02/21/2010  . PERSONAL HX COLONIC POLYPS 02/21/2010  . ANEMIA 04/16/2007  . Essential hypertension 04/16/2007  . Coronary atherosclerosis  04/16/2007  . DIVERTICULOSIS, COLON 04/16/2007  . ARTHRITIS 04/16/2007  . INTERNAL HEMORRHOIDS WITHOUT MENTION COMP 04/12/2007  . Asthma 04/12/2007  . Cardiac resynchronization therapy pacemaker (CRT-P) in place 04/12/2007  . ACUTE GASTRITIS WITHOUT MENTION OF HEMORRHAGE 12/14/2006  . COLONIC POLYPS, HYPERPLASTIC 09/27/2002  . FATTY LIVER DISEASE 02/16/2002     Procedures: CORONARY ARTERY BYPASS GRAFTING x 5  - 11/29/2014  Left internal mammary artery to left anterior descending   Saphenous vein graft to first diagonal  Sequential saphenous vein graft to obtuse marginals 1-2  Saphenous vein graft to posterior descending  Endoscopic greater saphenous vein harvest right leg and left thigh    HPI:  The patient is a 69 y.o. female with a complicated past medical history who presented on the date of admission with a 2-3 week history of shortness of breath and chest pain which associated with exertion. That morning, she was awakened from sleep at 0330 with acute pain and associated lower extremity edema and dyspnea. She was seen in the cardiology office and subsequently admitted for further workup.     Hospital Course:  The patient was admitted to [facility] on 11/23/2014.  She ruled out for myocardial infarction and BNP was 68. An echocardiogram was performed which showed an EF 55-60% with grade I diastolic dysfunction.  Cardiac catheterization was then performed which revealed 3 vessel coronary artery disease.  A cardiac surgery was consulted and Dr.  Roxan Hockey saw the patient and reviewed her films.  It was felt that she would benefit from CABG.  All risks, benefits and alternatives of surgery were explained in detail, and the patient agreed to proceed.   The patient was taken to the operating room and underwent the above procedure.  Intraoperatively, the RV pacemaker lead was noted to be through the wall of the pericardium and could not be repositioned in the heart, therefore it was  clipped.  The patient was extubated on postop day 1. She was treated with aggressive pulmonary toilet measures and diuresis, and also required Bipap for hypoxemia and hypercarbia.  She remained on pressors but was hemodynamically stable. She made slow but steady improvement and was weaned from all pressors and ready for transfer to stepdown by postop day 4.    Electrophysiology was consulted for recommendations about her pacemaker lead. It was felt that since her rhythm has remained stable, no further intervention will be necessary at this point and this will be followed as an outpatient.  Her blood pressures have been low normal but stable.  She has been aggressively diuresed but remains volume overloaded with edema on physical exam. Incisions are healing well.  Blood sugars have been controlled on low dose Levemir. She will need outpatient follow up of her hemoglobin A1C of 6.7. Pulmonary status is stable, but she remains on 2 liters of supplemental oxygen.  She will continue pulmonary toilet measures and wean O2 as able. She has been working with physical therapy and cardiac rehab on ambulation and mobility but remains deconditioned. It is felt that the patient will require short term SNF placement post-discharge and this is being arranged by social work.  She presently is medically stable on today's date for transfer once a bed is available.      Recent vital signs:  Filed Vitals:   12/07/14 0925  BP:   Pulse: 98  Temp:   Resp: 18    Recent laboratory studies:  CBC:  Recent Labs  12/05/14 0404 12/06/14 0215  WBC 12.5* 14.1*  HGB 8.2* 8.5*  HCT 27.2* 27.9*  PLT 200 241   BMET:   Recent Labs  12/06/14 0215 12/07/14 0631  NA 137 135  K 4.2 4.1  CL 95* 96*  CO2 30 32  GLUCOSE 136* 124*  BUN 37* 34*  CREATININE 1.41* 1.36*  CALCIUM 8.8* 8.3*    PT/INR: No results for input(s): LABPROT, INR in the last 72 hours.   Discharge Medications:     Medication List    STOP  taking these medications        amLODipine 10 MG tablet  Commonly known as:  NORVASC     aspirin 81 MG chewable tablet  Replaced by:  aspirin 325 MG EC tablet     BIOTIN PO     bisoprolol 5 MG tablet  Commonly known as:  ZEBETA     isosorbide mononitrate 30 MG 24 hr tablet  Commonly known as:  IMDUR     losartan 100 MG tablet  Commonly known as:  COZAAR     metolazone 2.5 MG tablet  Commonly known as:  ZAROXOLYN     nitroGLYCERIN 0.4 MG SL tablet  Commonly known as:  NITROSTAT     spironolactone 25 MG tablet  Commonly known as:  ALDACTONE      TAKE these medications        albuterol 108 (90 BASE) MCG/ACT inhaler  Commonly known as:  PROVENTIL HFA;VENTOLIN  HFA  Inhale 2 puffs into the lungs every 4 (four) hours as needed for wheezing (wheezing).     aspirin 325 MG EC tablet  Take 1 tablet (325 mg total) by mouth daily.     carvedilol 3.125 MG tablet  Commonly known as:  COREG  Take 1 tablet (3.125 mg total) by mouth 2 (two) times daily with a meal.     cetirizine 10 MG tablet  Commonly known as:  ZYRTEC  Take 10 mg by mouth daily.     clotrimazole 10 MG troche  Commonly known as:  MYCELEX  Take 10 mg by mouth every 4 (four) hours as needed (mouth pain).     dicyclomine 20 MG tablet  Commonly known as:  BENTYL  Take 1 tablet (20 mg total) by mouth 2 (two) times daily as needed (pain).     docusate sodium 100 MG capsule  Commonly known as:  COLACE  Take 1 capsule (100 mg total) by mouth daily.     fluticasone 50 MCG/ACT nasal spray  Commonly known as:  FLONASE  Place 1 spray into the nose 2 (two) times daily as needed for rhinitis or allergies.     furosemide 40 MG tablet  Commonly known as:  LASIX  Take 1 tablet (40 mg total) by mouth daily.     insulin aspart 100 UNIT/ML injection  Commonly known as:  novoLOG  Inject 4 Units into the skin 3 (three) times daily with meals.     insulin detemir 100 UNIT/ML injection  Commonly known as:  LEVEMIR    Inject 0.15 mLs (15 Units total) into the skin daily.     loperamide 2 MG tablet  Commonly known as:  IMODIUM A-D  Take 2 mg by mouth 4 (four) times daily as needed for diarrhea or loose stools (diarrhea).     omeprazole 40 MG capsule  Commonly known as:  PRILOSEC  Take 40 mg by mouth daily.     ondansetron 8 MG tablet  Commonly known as:  ZOFRAN  Take 1 tablet (8 mg total) by mouth every 8 (eight) hours as needed for nausea or vomiting.     ONE TOUCH ULTRA TEST test strip  Generic drug:  glucose blood  as directed.     ONETOUCH DELICA LANCETS 99991111 Misc  as directed.     oxyCODONE 5 MG immediate release tablet  Commonly known as:  Oxy IR/ROXICODONE  Take 1-2 tablets (5-10 mg total) by mouth every 3 (three) hours as needed for severe pain.     potassium chloride SA 20 MEQ tablet  Commonly known as:  K-DUR,KLOR-CON  Take 20 mEq by mouth daily.     pravastatin 80 MG tablet  Commonly known as:  PRAVACHOL  Take 1 tablet (80 mg total) by mouth every evening.     SYMBICORT 160-4.5 MCG/ACT inhaler  Generic drug:  budesonide-formoterol  Inhale 2 puffs into the lungs 2 (two) times daily.     triamcinolone cream 0.1 %  Commonly known as:  KENALOG  Apply 1 application topically daily.         Discharge Instructions:   1. Please obtain vital signs at least one time daily 2. Please weigh the patient daily. If he or she continues to gain weight or develops lower extremity edema, contact the office at [phone]. 3. Ambulate patient at least three times daily and please use sternal precautions. 4. The patient is to refrain from driving, heavy lifting or strenuous activity.  5. May shower daily and clean incisions with soap and water.   6. May resume regular diet.   Follow Up: Follow-up Information    Follow up with Melrose Nakayama, MD On 01/02/2015.   Specialty:  Cardiothoracic Surgery   Why:  Have a chest x-ray at [facility] at 9:30, then see MD at 10:30    Contact information:   [place] [address] [phone]       Follow up with Sanda Klein, MD On 12/22/2014.   Specialty:  Cardiology   Why:  Appointment is at 9:30   Contact information:   [address] [place] [address] [phone]       Discharge Instructions    Amb Referral to Cardiac Rehabilitation    Complete by:  As directed   Diagnosis:  CABG            The patient has been discharged on:  1.Beta Blocker: Yes [ x ]  No [  ]  If No, reason:    2.Ace Inhibitor/ARB: Yes [ ]   No [ x ]  If No, reason: Hypotension, elevated creatinine   3.Statin: Yes [ x ]  No [ ]   If No, reason:    4.Ecasa: Yes [ x ]  No [ ]   If No, reason:    COLLINS,GINA H 12/07/2014, 9:38 AM

## 2014-12-07 NOTE — Care Management Note (Signed)
Case Management Note  Patient Details  Name: [name] MRN: [id number] Date of Birth: 1945-03-25  Subjective/Objective:      Lives at home with two sons,  She works and says they do not assist her much and won't be available post discharge to assist.  Explained that her insurance my not approve a rehab for her, depending on progression and she may need to go home with 24/7 assistance.  States doesn't have anyone else that could assist her.  Respiratory issues last night, fluid overload.  Will continue to follow.               Action/Plan: Pt will discharge to [facility] 12/07/14  Expected Discharge Date:                  Expected Discharge Plan:  [place]  In-House Referral:     Discharge planning Services  CM Consult  Post Acute Care Choice:    Choice offered to:     DME Arranged:    DME Agency:     HH Arranged:    [place] Agency:     Status of Service:  Complete, will sign off   Medicare Important Message Given:  Yes-third notification given Date Medicare IM Given:    Medicare IM give by:    Date Additional Medicare IM Given:    Additional Medicare Important Message give by:     If discussed at [facility] of Stay Meetings, dates discussed:    Additional Comments: Pt will discharge to SNF with aide of CSW today 12/07/14. Maryclare Labrador, RN 12/07/2014, 1:50 PM

## 2014-12-07 NOTE — Progress Notes (Signed)
CARDIAC REHAB PHASE I   PRE:  Rate/Rhythm: 106 paced  BP:  Supine:   Sitting: 117/64  Standing:    SaO2: 96%3L  MODE:  Ambulation: 150 ft   POST:  Rate/Rhythm: 126 paced  BP:  Supine:   Sitting: 124/58  Standing:    SaO2: 94%2L 0807-0900 Pt did not want to walk but encouraged to do so. Pt walked 150 ft on 2L with rolling walker, stopping 4 times to rest. C/o legs tired. Encouraged her to purse lip breathe. To recliner after walk. Left on 2L. Encouraged IS and flutter valve use. Education completed with pt who voiced understanding. Discussed CRP 2 and referring to [facility].  Discussed heart healthy and carb counting. Put on discharge video for pt to view.   Graylon Good, RN BSN  12/07/2014 8:55 AM

## 2014-12-12 DIAGNOSIS — L03119 Cellulitis of unspecified part of limb: Secondary | ICD-10-CM

## 2014-12-12 DIAGNOSIS — L02619 Cutaneous abscess of unspecified foot: Secondary | ICD-10-CM

## 2014-12-12 HISTORY — DX: Cutaneous abscess of unspecified foot: L02.619

## 2014-12-12 HISTORY — DX: Cellulitis of unspecified part of limb: L03.119

## 2014-12-16 ENCOUNTER — Ambulatory Visit (HOSPITAL_COMMUNITY)
Admission: EM | Admit: 2014-12-16 | Discharge: 2014-12-16 | Disposition: A | Discharge status: Home / Self Care | Payer: Medicare Other | Source: Skilled Nursing Facility | Attending: Interventional Radiology | Admitting: Interventional Radiology

## 2014-12-16 ENCOUNTER — Ambulatory Visit (HOSPITAL_COMMUNITY)
Admission: RE | Admit: 2014-12-16 | Discharge: 2014-12-16 | Disposition: A | Discharge status: Home / Self Care | Payer: Medicare Other | Source: Ambulatory Visit | Attending: Internal Medicine | Admitting: Internal Medicine

## 2014-12-16 ENCOUNTER — Other Ambulatory Visit (HOSPITAL_COMMUNITY): Payer: Self-pay | Admitting: Internal Medicine

## 2014-12-16 DIAGNOSIS — I878 Other specified disorders of veins: Secondary | ICD-10-CM | POA: Insufficient documentation

## 2014-12-16 DIAGNOSIS — B999 Unspecified infectious disease: Secondary | ICD-10-CM

## 2014-12-16 MED ORDER — LIDOCAINE HCL 1 % IJ SOLN
INTRAMUSCULAR | Status: AC
  Filled 2014-12-16: qty 20

## 2014-12-16 MED ORDER — HEPARIN SOD (PORK) LOCK FLUSH 100 UNIT/ML IV SOLN
INTRAVENOUS | Status: AC
  Filled 2014-12-16: qty 5

## 2014-12-16 NOTE — Procedures (Signed)
Successful placement of right basilic vein approach 45 cm single lumen PICC line with tip at the superior caval-atrial junction.   The PICC line is ready for immediate use.  Ronny Bacon, MD Pager #: [phone]

## 2014-12-21 ENCOUNTER — Ambulatory Visit (INDEPENDENT_AMBULATORY_CARE_PROVIDER_SITE_OTHER): Payer: Self-pay | Admitting: Physician Assistant

## 2014-12-21 ENCOUNTER — Inpatient Hospital Stay (HOSPITAL_COMMUNITY): Payer: Medicare Other

## 2014-12-21 ENCOUNTER — Encounter (HOSPITAL_COMMUNITY): Payer: Self-pay | Admitting: General Practice

## 2014-12-21 ENCOUNTER — Inpatient Hospital Stay (HOSPITAL_COMMUNITY)
Admission: AD | Admit: 2014-12-21 | Discharge: 2015-01-01 | DRG: 602 | Disposition: A | Discharge status: Home Health | Payer: Medicare Other | Source: Ambulatory Visit | Attending: Thoracic Surgery (Cardiothoracic Vascular Surgery) | Admitting: Thoracic Surgery (Cardiothoracic Vascular Surgery)

## 2014-12-21 VITALS — BP 125/63 | HR 79 | Resp 18 | Ht 65.0 in | Wt 273.0 lb

## 2014-12-21 DIAGNOSIS — I5031 Acute diastolic (congestive) heart failure: Secondary | ICD-10-CM | POA: Diagnosis not present

## 2014-12-21 DIAGNOSIS — K580 Irritable bowel syndrome with diarrhea: Secondary | ICD-10-CM | POA: Diagnosis present

## 2014-12-21 DIAGNOSIS — L03115 Cellulitis of right lower limb: Secondary | ICD-10-CM

## 2014-12-21 DIAGNOSIS — I429 Cardiomyopathy, unspecified: Secondary | ICD-10-CM | POA: Diagnosis present

## 2014-12-21 DIAGNOSIS — E873 Alkalosis: Secondary | ICD-10-CM | POA: Diagnosis present

## 2014-12-21 DIAGNOSIS — Z951 Presence of aortocoronary bypass graft: Secondary | ICD-10-CM

## 2014-12-21 DIAGNOSIS — L0390 Cellulitis, unspecified: Secondary | ICD-10-CM | POA: Diagnosis present

## 2014-12-21 DIAGNOSIS — Z7982 Long term (current) use of aspirin: Secondary | ICD-10-CM | POA: Diagnosis not present

## 2014-12-21 DIAGNOSIS — I130 Hypertensive heart and chronic kidney disease with heart failure and stage 1 through stage 4 chronic kidney disease, or unspecified chronic kidney disease: Secondary | ICD-10-CM | POA: Diagnosis present

## 2014-12-21 DIAGNOSIS — R6883 Chills (without fever): Secondary | ICD-10-CM | POA: Diagnosis present

## 2014-12-21 DIAGNOSIS — E1122 Type 2 diabetes mellitus with diabetic chronic kidney disease: Secondary | ICD-10-CM | POA: Diagnosis present

## 2014-12-21 DIAGNOSIS — Z9889 Other specified postprocedural states: Secondary | ICD-10-CM

## 2014-12-21 DIAGNOSIS — E878 Other disorders of electrolyte and fluid balance, not elsewhere classified: Secondary | ICD-10-CM | POA: Diagnosis present

## 2014-12-21 DIAGNOSIS — Z95810 Presence of automatic (implantable) cardiac defibrillator: Secondary | ICD-10-CM

## 2014-12-21 DIAGNOSIS — I5033 Acute on chronic diastolic (congestive) heart failure: Secondary | ICD-10-CM | POA: Diagnosis present

## 2014-12-21 DIAGNOSIS — K219 Gastro-esophageal reflux disease without esophagitis: Secondary | ICD-10-CM | POA: Diagnosis present

## 2014-12-21 DIAGNOSIS — E876 Hypokalemia: Secondary | ICD-10-CM | POA: Diagnosis present

## 2014-12-21 DIAGNOSIS — N178 Other acute kidney failure: Secondary | ICD-10-CM | POA: Diagnosis not present

## 2014-12-21 DIAGNOSIS — E8770 Fluid overload, unspecified: Secondary | ICD-10-CM | POA: Diagnosis present

## 2014-12-21 DIAGNOSIS — M1990 Unspecified osteoarthritis, unspecified site: Secondary | ICD-10-CM | POA: Diagnosis present

## 2014-12-21 DIAGNOSIS — J45909 Unspecified asthma, uncomplicated: Secondary | ICD-10-CM | POA: Diagnosis present

## 2014-12-21 DIAGNOSIS — Z6841 Body Mass Index (BMI) 40.0 and over, adult: Secondary | ICD-10-CM | POA: Diagnosis not present

## 2014-12-21 DIAGNOSIS — G4733 Obstructive sleep apnea (adult) (pediatric): Secondary | ICD-10-CM | POA: Diagnosis present

## 2014-12-21 DIAGNOSIS — N183 Chronic kidney disease, stage 3 (moderate): Secondary | ICD-10-CM | POA: Diagnosis present

## 2014-12-21 DIAGNOSIS — I2510 Atherosclerotic heart disease of native coronary artery without angina pectoris: Secondary | ICD-10-CM | POA: Diagnosis present

## 2014-12-21 DIAGNOSIS — I454 Nonspecific intraventricular block: Secondary | ICD-10-CM | POA: Diagnosis present

## 2014-12-21 DIAGNOSIS — R0600 Dyspnea, unspecified: Secondary | ICD-10-CM

## 2014-12-21 DIAGNOSIS — Z794 Long term (current) use of insulin: Secondary | ICD-10-CM | POA: Diagnosis not present

## 2014-12-21 DIAGNOSIS — Z882 Allergy status to sulfonamides status: Secondary | ICD-10-CM

## 2014-12-21 DIAGNOSIS — R609 Edema, unspecified: Secondary | ICD-10-CM | POA: Diagnosis not present

## 2014-12-21 DIAGNOSIS — Z7951 Long term (current) use of inhaled steroids: Secondary | ICD-10-CM

## 2014-12-21 DIAGNOSIS — L03116 Cellulitis of left lower limb: Secondary | ICD-10-CM | POA: Diagnosis not present

## 2014-12-21 DIAGNOSIS — Z79899 Other long term (current) drug therapy: Secondary | ICD-10-CM | POA: Diagnosis not present

## 2014-12-21 DIAGNOSIS — N179 Acute kidney failure, unspecified: Secondary | ICD-10-CM | POA: Diagnosis present

## 2014-12-21 DIAGNOSIS — N189 Chronic kidney disease, unspecified: Secondary | ICD-10-CM | POA: Diagnosis not present

## 2014-12-21 HISTORY — DX: Cutaneous abscess of unspecified foot: L02.619

## 2014-12-21 HISTORY — DX: Cellulitis of unspecified part of limb: L03.119

## 2014-12-21 LAB — CBC
HCT: 29.7 % — ABNORMAL LOW (ref 36.0–46.0)
Hemoglobin: 8.6 g/dL — ABNORMAL LOW (ref 12.0–15.0)
MCH: 27.2 pg (ref 26.0–34.0)
MCHC: 29 g/dL — ABNORMAL LOW (ref 30.0–36.0)
MCV: 94 fL (ref 78.0–100.0)
Platelets: 240 10*3/uL (ref 150–400)
RBC: 3.16 MIL/uL — ABNORMAL LOW (ref 3.87–5.11)
RDW: 15.5 % (ref 11.5–15.5)
WBC: 5.6 10*3/uL (ref 4.0–10.5)

## 2014-12-21 LAB — GLUCOSE, CAPILLARY
Glucose-Capillary: 132 mg/dL — ABNORMAL HIGH (ref 65–99)
Glucose-Capillary: 94 mg/dL (ref 65–99)

## 2014-12-21 LAB — VANCOMYCIN, RANDOM: Vancomycin Rm: 13 ug/mL

## 2014-12-21 LAB — BASIC METABOLIC PANEL
Anion gap: 10 (ref 5–15)
BUN: 14 mg/dL (ref 6–20)
CO2: 33 mmol/L — ABNORMAL HIGH (ref 22–32)
Calcium: 8.6 mg/dL — ABNORMAL LOW (ref 8.9–10.3)
Chloride: 96 mmol/L — ABNORMAL LOW (ref 101–111)
Creatinine, Ser: 1.03 mg/dL — ABNORMAL HIGH (ref 0.44–1.00)
GFR calc Af Amer: >60 mL/min (ref >60)
GFR calc non Af Amer: 54 mL/min — ABNORMAL LOW (ref >60)
Glucose, Bld: 157 mg/dL — ABNORMAL HIGH (ref 65–99)
Potassium: 3.8 mmol/L (ref 3.5–5.1)
Sodium: 139 mmol/L (ref 135–145)

## 2014-12-21 MED ORDER — OXYCODONE HCL 5 MG PO TABS
5.0000 mg | ORAL_TABLET | ORAL | Status: DC | PRN
  Administered 2014-12-21 – 2014-12-22 (×3): 5 mg via ORAL
  Filled 2014-12-21 (×3): qty 1

## 2014-12-21 MED ORDER — ALBUTEROL SULFATE (2.5 MG/3ML) 0.083% IN NEBU
3.0000 mL | INHALATION_SOLUTION | RESPIRATORY_TRACT | Status: DC | PRN
  Filled 2014-12-21: qty 3

## 2014-12-21 MED ORDER — VANCOMYCIN HCL IN DEXTROSE 1-5 GM/200ML-% IV SOLN
1000.0000 mg | Freq: Two times a day (BID) | INTRAVENOUS | Status: DC
  Administered 2014-12-21 – 2014-12-26 (×10): 1000 mg via INTRAVENOUS
  Filled 2014-12-21 (×12): qty 200

## 2014-12-21 MED ORDER — ONDANSETRON HCL 4 MG PO TABS
4.0000 mg | ORAL_TABLET | Freq: Four times a day (QID) | ORAL | Status: DC | PRN
  Filled 2014-12-21: qty 1

## 2014-12-21 MED ORDER — FLUTICASONE PROPIONATE 50 MCG/ACT NA SUSP
1.0000 | Freq: Every day | NASAL | Status: DC
Start: 2014-12-21 — End: 2015-01-02
  Administered 2014-12-21 – 2014-12-31 (×8): 1 via NASAL
  Filled 2014-12-21: qty 16

## 2014-12-21 MED ORDER — INSULIN DETEMIR 100 UNIT/ML ~~LOC~~ SOLN
15.0000 [IU] | Freq: Every day | SUBCUTANEOUS | Status: DC
  Administered 2014-12-22 – 2015-01-01 (×11): 15 [IU] via SUBCUTANEOUS
  Filled 2014-12-21 (×11): qty 0.15

## 2014-12-21 MED ORDER — DICYCLOMINE HCL 10 MG PO CAPS
20.0000 mg | ORAL_CAPSULE | Freq: Two times a day (BID) | ORAL | Status: DC | PRN
  Administered 2014-12-24 – 2014-12-29 (×2): 20 mg via ORAL
  Filled 2014-12-21 (×4): qty 2

## 2014-12-21 MED ORDER — FUROSEMIDE 10 MG/ML IJ SOLN
40.0000 mg | Freq: Once | INTRAMUSCULAR | Status: AC
  Administered 2014-12-21: 40 mg via INTRAVENOUS
  Filled 2014-12-21: qty 4

## 2014-12-21 MED ORDER — ACETAMINOPHEN 650 MG RE SUPP: 650.0000 mg | Freq: Four times a day (QID) | RECTAL | Status: DC | PRN

## 2014-12-21 MED ORDER — PRAVASTATIN SODIUM 40 MG PO TABS
80.0000 mg | ORAL_TABLET | Freq: Every day | ORAL | Status: DC
  Administered 2014-12-21 – 2015-01-01 (×12): 80 mg via ORAL
  Filled 2014-12-21 (×13): qty 2

## 2014-12-21 MED ORDER — ACETAMINOPHEN 325 MG PO TABS
650.0000 mg | ORAL_TABLET | Freq: Four times a day (QID) | ORAL | Status: DC | PRN
  Administered 2014-12-24 – 2014-12-26 (×3): 650 mg via ORAL
  Filled 2014-12-21 (×4): qty 2

## 2014-12-21 MED ORDER — POTASSIUM CHLORIDE CRYS ER 20 MEQ PO TBCR
20.0000 meq | EXTENDED_RELEASE_TABLET | Freq: Every day | ORAL | Status: DC
  Filled 2014-12-21: qty 1

## 2014-12-21 MED ORDER — SODIUM CHLORIDE 0.9 % IJ SOLN
10.0000 mL | INTRAMUSCULAR | Status: DC | PRN
Start: 2014-12-21 — End: 2015-01-02
  Administered 2014-12-26 – 2014-12-30 (×5): 10 mL
  Filled 2014-12-21 (×5): qty 40

## 2014-12-21 MED ORDER — ONDANSETRON HCL 4 MG/2ML IJ SOLN
4.0000 mg | Freq: Four times a day (QID) | INTRAMUSCULAR | Status: DC | PRN
  Administered 2014-12-23 – 2014-12-28 (×7): 4 mg via INTRAVENOUS
  Filled 2014-12-21 (×6): qty 2

## 2014-12-21 MED ORDER — LORATADINE 10 MG PO TABS
10.0000 mg | ORAL_TABLET | Freq: Every day | ORAL | Status: DC
  Administered 2014-12-22 – 2015-01-01 (×11): 10 mg via ORAL
  Filled 2014-12-21 (×12): qty 1

## 2014-12-21 MED ORDER — ASPIRIN EC 325 MG PO TBEC
325.0000 mg | DELAYED_RELEASE_TABLET | Freq: Every day | ORAL | Status: DC
  Administered 2014-12-22 – 2015-01-01 (×11): 325 mg via ORAL
  Filled 2014-12-21 (×12): qty 1

## 2014-12-21 MED ORDER — INSULIN ASPART 100 UNIT/ML ~~LOC~~ SOLN
4.0000 [IU] | Freq: Three times a day (TID) | SUBCUTANEOUS | Status: DC
  Administered 2014-12-21 – 2015-01-01 (×22): 4 [IU] via SUBCUTANEOUS

## 2014-12-21 MED ORDER — DOCUSATE SODIUM 100 MG PO CAPS: 100.0000 mg | ORAL_CAPSULE | Freq: Two times a day (BID) | ORAL | Status: DC | PRN

## 2014-12-21 MED ORDER — POLYETHYLENE GLYCOL 3350 17 G PO PACK
17.0000 g | PACK | Freq: Every day | ORAL | Status: DC
  Administered 2014-12-21 – 2014-12-23 (×2): 17 g via ORAL
  Filled 2014-12-21 (×3): qty 1

## 2014-12-21 MED ORDER — CARVEDILOL 3.125 MG PO TABS
3.1250 mg | ORAL_TABLET | Freq: Two times a day (BID) | ORAL | Status: DC
  Administered 2014-12-21 – 2014-12-29 (×15): 3.125 mg via ORAL
  Filled 2014-12-21 (×19): qty 1

## 2014-12-21 MED ORDER — PANTOPRAZOLE SODIUM 40 MG PO TBEC
80.0000 mg | DELAYED_RELEASE_TABLET | Freq: Every day | ORAL | Status: DC
  Administered 2014-12-22 – 2015-01-01 (×12): 80 mg via ORAL
  Filled 2014-12-21 (×15): qty 2

## 2014-12-21 MED ORDER — BUDESONIDE-FORMOTEROL FUMARATE 160-4.5 MCG/ACT IN AERO
2.0000 | INHALATION_SPRAY | Freq: Two times a day (BID) | RESPIRATORY_TRACT | Status: DC
  Administered 2014-12-21 – 2015-01-01 (×21): 2 via RESPIRATORY_TRACT
  Filled 2014-12-21 (×3): qty 6

## 2014-12-21 MED ORDER — INSULIN ASPART 100 UNIT/ML ~~LOC~~ SOLN
0.0000 [IU] | Freq: Three times a day (TID) | SUBCUTANEOUS | Status: DC
  Administered 2014-12-21 – 2014-12-26 (×4): 3 [IU] via SUBCUTANEOUS
  Administered 2014-12-27: 4 [IU] via SUBCUTANEOUS
  Administered 2014-12-28: 3 [IU] via SUBCUTANEOUS
  Administered 2014-12-29: 4 [IU] via SUBCUTANEOUS
  Administered 2014-12-29 – 2014-12-30 (×3): 3 [IU] via SUBCUTANEOUS

## 2014-12-21 MED ORDER — ENOXAPARIN SODIUM 60 MG/0.6ML ~~LOC~~ SOLN
0.5000 mg/kg | SUBCUTANEOUS | Status: DC
  Administered 2014-12-21 – 2014-12-30 (×10): 60 mg via SUBCUTANEOUS
  Filled 2014-12-21 (×11): qty 0.6

## 2014-12-21 MED ORDER — PIPERACILLIN-TAZOBACTAM 3.375 G IVPB
3.3750 g | Freq: Three times a day (TID) | INTRAVENOUS | Status: DC
  Administered 2014-12-21 – 2014-12-29 (×23): 3.375 g via INTRAVENOUS
  Filled 2014-12-21 (×30): qty 50

## 2014-12-21 NOTE — Progress Notes (Signed)
HPI:  Patient returns for routine postoperative follow-up having undergone CABG x 5 on 12/07/2014.  She has multiple medical problems including morbid obesity, DM, Cardiomyopathy with PPM in place.  She presents today by the request of her nursing facility for a wound check.  She states when she left the hospital her toes were blue.  She states her sister was putting lotion on her leg and noticed it was very red and there were blisters along her toes.  The nurse practitioner at the center started her on Vancomycin and they wished for [place] to see if the leg was improving.  The patient states the leg remains red.  It is tender to touch and there are blisters down her lower extremity.  She denies fever.  Current Outpatient Prescriptions  Medication Sig Dispense Refill  . albuterol (PROVENTIL HFA;VENTOLIN HFA) 108 (90 BASE) MCG/ACT inhaler Inhale 2 puffs into the lungs every 4 (four) hours as needed for wheezing (wheezing).     [name] aspirin EC 325 MG EC tablet Take 1 tablet (325 mg total) by mouth daily. 30 tablet 0  . carvedilol (COREG) 3.125 MG tablet Take 1 tablet (3.125 mg total) by mouth 2 (two) times daily with a meal.    . cetirizine (ZYRTEC) 10 MG tablet Take 10 mg by mouth daily.     . clotrimazole (MYCELEX) 10 MG troche Take 10 mg by mouth every 4 (four) hours as needed (mouth pain).    [name] dicyclomine (BENTYL) 20 MG tablet Take 1 tablet (20 mg total) by mouth 2 (two) times daily as needed (pain). 60 tablet 0  . docusate sodium (COLACE) 100 MG capsule Take 1 capsule (100 mg total) by mouth daily. (Patient taking differently: Take 100 mg by mouth at bedtime. ) 30 capsule 0  . fluticasone (FLONASE) 50 MCG/ACT nasal spray Place 1 spray into the nose 2 (two) times daily as needed for rhinitis or allergies.     . furosemide (LASIX) 40 MG tablet Take 1 tablet (40 mg total) by mouth daily. 60 tablet 5  . insulin aspart (NOVOLOG) 100 UNIT/ML injection Inject 4 Units into the skin 3 (three) times daily with  meals. 10 mL 11  . insulin detemir (LEVEMIR) 100 UNIT/ML injection Inject 0.15 mLs (15 Units total) into the skin daily. 10 mL 11  . omeprazole (PRILOSEC) 40 MG capsule Take 40 mg by mouth daily.     . ONE TOUCH ULTRA TEST test strip as directed.     Glory Rosebush DELICA LANCETS 99991111 MISC as directed.     [name] oxyCODONE (OXY IR/ROXICODONE) 5 MG immediate release tablet Take 1-2 tablets (5-10 mg total) by mouth every 3 (three) hours as needed for severe pain. 30 tablet 0  . potassium chloride SA (K-DUR,KLOR-CON) 20 MEQ tablet Take 20 mEq by mouth daily.     . pravastatin (PRAVACHOL) 80 MG tablet Take 1 tablet (80 mg total) by mouth every evening. 90 tablet 2  . SYMBICORT 160-4.5 MCG/ACT inhaler Inhale 2 puffs into the lungs 2 (two) times daily.    [name] triamcinolone cream (KENALOG) 0.1 % Apply 1 application topically daily.     [name] loperamide (IMODIUM A-D) 2 MG tablet Take 2 mg by mouth 4 (four) times daily as needed for diarrhea or loose stools (diarrhea).    . ondansetron (ZOFRAN) 8 MG tablet Take 1 tablet (8 mg total) by mouth every 8 (eight) hours as needed for nausea or vomiting. (Patient not taking: Reported on 11/23/2014) 20 tablet 0  No current facility-administered medications for this visit.    Physical Exam:  BP 125/63 mmHg  Pulse 79  Resp 18  Ht 5\' 5"  (1.651 m)  Wt 273 lb (123.832 kg)  BMI 45.43 kg/m2  SpO2 98%  Gen: no apparent distress, morbidly obese Heart: RRR Lungs: CTA bilaterally Skin: RLE cellulitis, warm to touch, several tiny blisters along LE, her toes remain mottled and the skin is sloughing off her 4th toe  A/P:  1. RLE Cellulitis- patient has been on IV Vancomycin since Sunday, however nurse practitioner from SNF sent patient here to see if we thought cellulitis treatment was working..... Being I did not see the RLE prior to starting ABX, I can not assess if the extremity has improved.  However, to me the RLE continues to have evidence of frank cellulitis.  The skin is  warm to touch and several blisters are present.  She would probably benefit from admission to the hospital for further care and monitoring.    2. Discuss patient with Dr. Prescott Gum and he was agreeable to admission to the hospital for further IV ABX, Rule Out DVT, and IV diuresis   Tayquan Gassman, PA-C [facility] [phone]

## 2014-12-21 NOTE — Progress Notes (Addendum)
ANTIBIOTIC CONSULT NOTE - INITIAL  Pharmacy Consult for Zosyn and Vancomycin Indication: cellulitis  Allergies  Allergen Reactions  . Ivp Dye [Iodinated Diagnostic Agents] Shortness Of Breath    No reaction to PO contrast with non-ionic dye.06-25-2014/rsm  . Shellfish-Derived Products Anaphylaxis  . Sulfa Antibiotics Shortness Of Breath  . Iodine Hives  . Zithromax [Azithromycin] Rash    Patient Measurements:     Vital Signs: Temp: 97.8 F (36.6 C) (11/10 1546) Temp Source: Oral (11/10 1546) BP: 123/60 mmHg (11/10 1546) Pulse Rate: 76 (11/10 1546) Intake/Output from previous day:   Intake/Output from this shift:    Labs: No results for input(s): WBC, HGB, PLT, LABCREA, CREATININE in the last 72 hours. Estimated Creatinine Clearance: 50.9 mL/min (by C-G formula based on Cr of 1.36). No results for input(s): VANCOTROUGH, VANCOPEAK, VANCORANDOM, GENTTROUGH, GENTPEAK, GENTRANDOM, TOBRATROUGH, TOBRAPEAK, TOBRARND, AMIKACINPEAK, AMIKACINTROU, AMIKACIN in the last 72 hours.   Microbiology: Recent Results (from the past 720 hour(s))  Culture, Urine     Status: None   Collection Time: 11/26/14 10:09 AM  Result Value Ref Range Status   Specimen Description URINE, CLEAN CATCH  Final   Special Requests None  Final   Culture MULTIPLE SPECIES PRESENT, SUGGEST RECOLLECTION  Final   Report Status 11/27/2014 FINAL  Final  Surgical pcr screen     Status: None   Collection Time: 11/29/14  5:39 AM  Result Value Ref Range Status   MRSA, PCR NEGATIVE NEGATIVE Final   Staphylococcus aureus NEGATIVE NEGATIVE Final    Comment:        The Xpert SA Assay (FDA approved for NASAL specimens in patients over 70 years of age), is one component of a comprehensive surveillance program.  Test performance has been validated by [facility] for patients greater than 70 year old or equal to 70 year old. It is not intended to diagnose infection nor to guide or monitor treatment.    Assessment: 69 YOF s/p  CABG at the end of October who was noted to have redness and swelling of her leg. She was started on vancomycin at her facility sent her to the doctor to see if it was working. She was on vancomycin 1g IV q12h with last dose this morning. With history of DM, obesity. SCr 1.36 from last admission (12/07/2014), est CrCl using that value ~67mL/min.   Goal of Therapy:  Vancomycin trough level 10-15 mcg/ml  Plan:  -Zosyn 3.375g IV q8h EI -will obtain random vancomycin level at 1800 and determine dosing based on that -follow c/s, renal function, clinical improvement  Tansy Lorek D. Desean Heemstra, PharmD, BCPS Clinical Pharmacist Pager: [phone] 12/21/2014 5:12 PM  ADDENDUM Random vancomycin level returned this evening at 39mcg/mL. Suspect this is close to a true trough since patient did receive a dose this morning.  Plan: -continue vancomycin at 1g IV q12h -rest of plan as above  Latifa Noble D. Merary Garguilo, PharmD, BCPS Clinical Pharmacist Pager: [phone] 12/21/2014 7:34 PM

## 2014-12-21 NOTE — H&P (Signed)
HPI:  Patient returns for routine postoperative follow-up having undergone CABG x 5 on 12/07/2014. She has multiple medical problems including morbid obesity, DM, Cardiomyopathy with PPM in place. She presents today by the request of her nursing facility for a wound check. She states when she left the hospital her toes were blue. She states her sister was putting lotion on her leg and noticed it was very red and there were blisters along her toes. The nurse practitioner at the center started her on Vancomycin and they wished for [place] to see if the leg was improving. The patient states the leg remains red. It is tender to touch and there are blisters down her lower extremity. She denies fever.  Current Outpatient Prescriptions  Medication Sig Dispense Refill  . albuterol (PROVENTIL HFA;VENTOLIN HFA) 108 (90 BASE) MCG/ACT inhaler Inhale 2 puffs into the lungs every 4 (four) hours as needed for wheezing (wheezing).     [name] aspirin EC 325 MG EC tablet Take 1 tablet (325 mg total) by mouth daily. 30 tablet 0  . carvedilol (COREG) 3.125 MG tablet Take 1 tablet (3.125 mg total) by mouth 2 (two) times daily with a meal.    . cetirizine (ZYRTEC) 10 MG tablet Take 10 mg by mouth daily.     . clotrimazole (MYCELEX) 10 MG troche Take 10 mg by mouth every 4 (four) hours as needed (mouth pain).    [name] dicyclomine (BENTYL) 20 MG tablet Take 1 tablet (20 mg total) by mouth 2 (two) times daily as needed (pain). 60 tablet 0  . docusate sodium (COLACE) 100 MG capsule Take 1 capsule (100 mg total) by mouth daily. (Patient taking differently: Take 100 mg by mouth at bedtime. ) 30 capsule 0  . fluticasone (FLONASE) 50 MCG/ACT nasal spray Place 1 spray into the nose 2 (two) times daily as needed for rhinitis or allergies.     . furosemide (LASIX) 40 MG tablet Take 1 tablet (40 mg total) by mouth daily. 60 tablet 5  . insulin aspart (NOVOLOG) 100 UNIT/ML injection  Inject 4 Units into the skin 3 (three) times daily with meals. 10 mL 11  . insulin detemir (LEVEMIR) 100 UNIT/ML injection Inject 0.15 mLs (15 Units total) into the skin daily. 10 mL 11  . omeprazole (PRILOSEC) 40 MG capsule Take 40 mg by mouth daily.     . ONE TOUCH ULTRA TEST test strip as directed.     Glory Rosebush DELICA LANCETS [id number] MISC as directed.     [name] oxyCODONE (OXY IR/ROXICODONE) 5 MG immediate release tablet Take 1-2 tablets (5-10 mg total) by mouth every 3 (three) hours as needed for severe pain. 30 tablet 0  . potassium chloride SA (K-DUR,KLOR-CON) 20 MEQ tablet Take 20 mEq by mouth daily.     . pravastatin (PRAVACHOL) 80 MG tablet Take 1 tablet (80 mg total) by mouth every evening. 90 tablet 2  . SYMBICORT 160-4.5 MCG/ACT inhaler Inhale 2 puffs into the lungs 2 (two) times daily.    [name] triamcinolone cream (KENALOG) 0.1 % Apply 1 application topically daily.     [name] loperamide (IMODIUM A-D) 2 MG tablet Take 2 mg by mouth 4 (four) times daily as needed for diarrhea or loose stools (diarrhea).    . ondansetron (ZOFRAN) 8 MG tablet Take 1 tablet (8 mg total) by mouth every 8 (eight) hours as needed for nausea or vomiting. (Patient not taking: Reported on 11/23/2014) 20 tablet 0   No current facility-administered medications for this  visit.    Physical Exam:  BP 125/63 mmHg  Pulse 79  Resp 18  Ht 5\' 5"  (1.651 m)  Wt 273 lb (123.832 kg)  BMI 45.43 kg/m2  SpO2 98%  Gen: no apparent distress, morbidly obese Heart: RRR Lungs: CTA bilaterally Skin: RLE cellulitis, warm to touch, several tiny blisters along LE, her toes remain mottled and the skin is sloughing off her 4th toe  A/P:  1. RLE Cellulitis- patient has been on IV Vancomycin since Sunday, however nurse practitioner from SNF sent patient here to see if we thought cellulitis treatment was working..... Being I did not see the RLE prior to starting ABX, I can not assess  if the extremity has improved. However, to me the RLE continues to have evidence of frank cellulitis. The skin is warm to touch and several blisters are present. She would probably benefit from admission to the hospital for further care and monitoring.   2. Discuss patient with Dr. Prescott Gum and he was agreeable to admission to the hospital for further IV ABX, Rule Out DVT, and IV diuresis   BARRETT, ERIN, PA-C [facility] [phone]             Patient seen and examined, agree with above 54 woman with recent CABG. DC to [facility].  Now has severe cellulitis right leg. She was been on vancomycin.- will continue vanco and add Zosyn as there could be a GNR involved.  Revonda Standard Roxan Hockey, MD [facility] [phone]

## 2014-12-22 ENCOUNTER — Encounter: Payer: Medicare Other | Admitting: Cardiovascular Disease

## 2014-12-22 ENCOUNTER — Inpatient Hospital Stay (HOSPITAL_COMMUNITY): Payer: Medicare Other

## 2014-12-22 ENCOUNTER — Telehealth: Payer: Self-pay | Admitting: *Deleted

## 2014-12-22 DIAGNOSIS — R609 Edema, unspecified: Secondary | ICD-10-CM

## 2014-12-22 LAB — GLUCOSE, CAPILLARY
Glucose-Capillary: 102 mg/dL — ABNORMAL HIGH (ref 65–99)
Glucose-Capillary: 93 mg/dL (ref 65–99)
Glucose-Capillary: 144 mg/dL — ABNORMAL HIGH (ref 65–99)
Glucose-Capillary: 128 mg/dL — ABNORMAL HIGH (ref 65–99)

## 2014-12-22 MED ORDER — FUROSEMIDE 10 MG/ML IJ SOLN
40.0000 mg | Freq: Once | INTRAMUSCULAR | Status: AC
  Administered 2014-12-22: 40 mg via INTRAVENOUS
  Filled 2014-12-22: qty 4

## 2014-12-22 MED ORDER — OXYCODONE HCL 5 MG PO TABS
5.0000 mg | ORAL_TABLET | ORAL | Status: DC | PRN
  Administered 2014-12-22 – 2014-12-30 (×20): 10 mg via ORAL
  Filled 2014-12-22 (×22): qty 2

## 2014-12-22 MED ORDER — POTASSIUM CHLORIDE CRYS ER 20 MEQ PO TBCR
40.0000 meq | EXTENDED_RELEASE_TABLET | Freq: Once | ORAL | Status: AC
  Administered 2014-12-22: 40 meq via ORAL
  Filled 2014-12-22: qty 2

## 2014-12-22 NOTE — Telephone Encounter (Signed)
12/19/14 faxed signed order for Phase II Cardiac Rehab to [facility]

## 2014-12-22 NOTE — Progress Notes (Signed)
VASCULAR LAB PRELIMINARY  PRELIMINARY  PRELIMINARY  PRELIMINARY  Bilateral lower extremity venous duplex completed.    Preliminary report:  Bilateral:  No evidence of DVT, superficial thrombosis, or Baker's Cyst.   Iyan Flett, RVS 12/22/2014, 7:03 PM

## 2014-12-22 NOTE — Progress Notes (Signed)
Utilization review completed. Annisa Mazzarella, RN, BSN. 

## 2014-12-22 NOTE — Care Management Note (Signed)
Case Management Note  Patient Details  Name: [name] MRN: [id number] Date of Birth: 01/17/1946  Subjective/Objective:    Right LE cellulitis                Action/Plan: Notified CSW of pt admitted from [facility] SNF, will return to SNF with possible IV abx.   Expected Discharge Date:  12/24/2014             Expected Discharge Plan:  [place]  In-House Referral:  Clinical Social Work  Discharge planning Services  CM Consult   Status of Service:  Completed, signed off  Medicare Important Message Given:    Date Medicare IM Given:    Medicare IM give by:    Date Additional Medicare IM Given:    Additional Medicare Important Message give by:     If discussed at [place] of Stay Meetings, dates discussed:    Additional Comments:  Erenest Rasher, RN 12/22/2014, 1:49 PM

## 2014-12-22 NOTE — Progress Notes (Addendum)
Subjective: C/o sign lower ext discomfort  Objective: Vital signs in last 24 hours: Temp:  [97.8 F (36.6 C)-98.3 F (36.8 C)] 97.8 F (36.6 C) (11/11 0358) Pulse Rate:  [75-82] 82 (11/11 0358) Cardiac Rhythm:  [-] Ventricular paced (11/11 0728) Resp:  [18] 18 (11/11 0358) BP: (106-126)/(41-71) 107/41 mmHg (11/11 0358) SpO2:  [98 %-100 %] 100 % (11/11 0358) Weight:  [268 lb 14.4 oz (121.972 kg)-273 lb (123.832 kg)] 268 lb 14.4 oz (121.972 kg) (11/11 0358)  Hemodynamic parameters for last 24 hours:    Intake/Output from previous day: 11/10 0701 - 11/11 0700 In: 410 [P.O.:360; IV Piggyback:50] Out: 1000 [Urine:1000] Intake/Output this shift:    General appearance: alert, cooperative and mild distress Heart: regular rate and rhythm Lungs: clear to auscultation bilaterally Abdomen: benign Extremities: marked pitting edema Wound: + cellulitis right LE   Lab Results:  Recent Labs  12/21/14 1900  WBC 5.6  HGB 8.6*  HCT 29.7*  PLT 240   BMET:  Recent Labs  12/21/14 1900  NA 139  K 3.8  CL 96*  CO2 33*  GLUCOSE 157*  BUN 14  CREATININE 1.03*  CALCIUM 8.6*    PT/INR: No results for input(s): LABPROT, INR in the last 72 hours. ABG    Component Value Date/Time   PHART 7.506* 12/03/2014 1136   HCO3 32.0* 12/03/2014 1136   TCO2 33 12/03/2014 1136   ACIDBASEDEF 4.0* 11/29/2014 1541   O2SAT 94.0 12/03/2014 1136   CBG (last 3)   Recent Labs  12/21/14 1845 12/21/14 2131 12/22/14 0639  GLUCAP 132* 94 102*    Meds Scheduled Meds: . aspirin EC  325 mg Oral Daily  . budesonide-formoterol  2 puff Inhalation BID  . carvedilol  3.125 mg Oral BID WC  . enoxaparin (LOVENOX) injection  0.5 mg/kg Subcutaneous Q24H  . fluticasone  1 spray Each Nare Daily  . insulin aspart  0-20 Units Subcutaneous TID WC  . insulin aspart  4 Units Subcutaneous TID WC  . insulin detemir  15 Units Subcutaneous Daily  . loratadine  10 mg Oral Daily  . pantoprazole  80 mg Oral  Daily  . piperacillin-tazobactam (ZOSYN)  IV  3.375 g Intravenous 3 times per day  . polyethylene glycol  17 g Oral Daily  . potassium chloride  20 mEq Oral Daily  . pravastatin  80 mg Oral Daily  . vancomycin  1,000 mg Intravenous Q12H   Continuous Infusions:  PRN Meds:.acetaminophen **OR** acetaminophen, albuterol, dicyclomine, docusate sodium, ondansetron **OR** ondansetron (ZOFRAN) IV, oxyCODONE, sodium chloride  Xrays X-ray Chest Pa And Lateral  12/21/2014  CLINICAL DATA:  69 year old female with shortness of breath and lower extremity pain. Right lower extremity cellulitis. Recent CABG in October. Initial encounter. EXAM: CHEST  2 VIEW COMPARISON:  12/05/2014 and earlier. FINDINGS: Large body habitus. The patient's arms are not raised on the lateral view limiting its utility. Stable cardiac size and mediastinal contours. Sequelae of CABG. Right chest cardiac AICD appears stable. Lung volumes are stable, but there has been some worsening of ventilation at the left lung base felt related to combination small left pleural effusion and atelectasis. No pneumothorax or pulmonary edema. The right lung remains clear. Stable visualized osseous structures. Cervical ACDF. Calcified aortic atherosclerosis. IMPRESSION: Stable lung volumes as seen in October. Worsening ventilation at the left lung base felt related to combined small pleural effusion and atelectasis. Left lung base pneumonia would be difficult to exclude. Electronically Signed   By: Genevie Ann  M.D.   On: 12/21/2014 17:08    Assessment/Plan:  Stable but having significant pain- will increase dose and frequency of oxy Repeat IV lasix today/KCL 40 meq today Check venous duplex Cont current abx     LOS: 1 day    GOLD,WAYNE E 12/22/2014  Patient seen and examined, findings as noted above Plan as outlined above  Remo Lipps C. Roxan Hockey, MD [facility] [phone]

## 2014-12-23 LAB — GLUCOSE, CAPILLARY
Glucose-Capillary: 87 mg/dL (ref 65–99)
Glucose-Capillary: 124 mg/dL — ABNORMAL HIGH (ref 65–99)
Glucose-Capillary: 111 mg/dL — ABNORMAL HIGH (ref 65–99)
Glucose-Capillary: 164 mg/dL — ABNORMAL HIGH (ref 65–99)

## 2014-12-23 MED ORDER — FUROSEMIDE 10 MG/ML IJ SOLN
80.0000 mg | Freq: Two times a day (BID) | INTRAMUSCULAR | Status: DC
  Administered 2014-12-23 – 2014-12-25 (×4): 80 mg via INTRAVENOUS
  Filled 2014-12-23 (×4): qty 8

## 2014-12-23 MED ORDER — POTASSIUM CHLORIDE CRYS ER 20 MEQ PO TBCR
20.0000 meq | EXTENDED_RELEASE_TABLET | Freq: Two times a day (BID) | ORAL | Status: DC
  Administered 2014-12-23 – 2014-12-24 (×3): 20 meq via ORAL
  Filled 2014-12-23 (×3): qty 1

## 2014-12-23 MED ORDER — POTASSIUM CHLORIDE CRYS ER 20 MEQ PO TBCR
20.0000 meq | EXTENDED_RELEASE_TABLET | Freq: Once | ORAL | Status: AC
  Administered 2014-12-23: 20 meq via ORAL
  Filled 2014-12-23: qty 1

## 2014-12-23 MED ORDER — FUROSEMIDE 10 MG/ML IJ SOLN
40.0000 mg | Freq: Once | INTRAMUSCULAR | Status: AC
  Administered 2014-12-23: 40 mg via INTRAVENOUS
  Filled 2014-12-23: qty 4

## 2014-12-23 NOTE — Progress Notes (Addendum)
       [address]       [place] [address]             [phone]               Subjective: C/o leg soreness, but feels a little better than yesterday .   Objective: Vital signs in last 24 hours: Patient Vitals for the past 24 hrs:  BP Temp Temp src Pulse Resp SpO2 Weight  12/23/14 0719 - - - - - 93 % -  12/23/14 0434 (!) 119/47 mmHg 97.8 F (36.6 C) Oral 68 18 96 % 269 lb 6.4 oz (122.2 kg)  12/22/14 2157 (!) 103/45 mmHg 98.1 F (36.7 C) Oral 77 18 99 % -  12/22/14 1404 (!) 105/47 mmHg 97.8 F (36.6 C) Oral 74 18 98 % -   Current Weight  12/23/14 269 lb 6.4 oz (122.2 kg)     Intake/Output from previous day: 11/11 0701 - 11/12 0700 In: 720 [P.O.:720] Out: 2001 [Urine:2000; Stool:1] CBGs 128-87   PHYSICAL EXAM:  Heart: RRR Lungs: Clear Wound: Clean and dry Extremities: +bilateral LE edema, R>L with erythema of R lower leg     Lab Results: CBC: Recent Labs  12/21/14 1900  WBC 5.6  HGB 8.6*  HCT 29.7*  PLT 240   BMET:  Recent Labs  12/21/14 1900  NA 139  K 3.8  CL 96*  CO2 33*  GLUCOSE 157*  BUN 14  CREATININE 1.03*  CALCIUM 8.6*    PT/INR: No results for input(s): LABPROT, INR in the last 72 hours. Duplex: Summary:  - No evidence of deep vein thrombosis involving the visualized  veins of the right lower extremity. - No evidence of deep vein thrombosis involving the visualized  veins of the left lower extremity. - Unable to visualize the peroneal veins well enough to fully  evaluate due to edema. - No evidence of superficial thrombosis of the right or left lower  extremity. - No evidence of Baker&'s cyst on the right or left.   Assessment/Plan: RLE cellulitis- Stable, some improvement.  Continue Vanc/Zosyn. Edema- Continue diuresis. Duplex negative for DVT. Pt encouraged to keep leg elevated to help with swelling. CV- BPs, HR stable on current meds. DM- CBGs stable, continue Levemir.   LOS: 2 days     COLLINS,GINA H 12/23/2014  Patient seen and examined, agree with above Looks better today Needs additional diuresis- will give lasix 80  Bid  Nusayba Cadenas C. Roxan Hockey, MD [facility] [phone]

## 2014-12-24 LAB — BASIC METABOLIC PANEL
Anion gap: 7 (ref 5–15)
BUN: 17 mg/dL (ref 6–20)
CO2: 39 mmol/L — ABNORMAL HIGH (ref 22–32)
Calcium: 8.5 mg/dL — ABNORMAL LOW (ref 8.9–10.3)
Chloride: 94 mmol/L — ABNORMAL LOW (ref 101–111)
Creatinine, Ser: 1.26 mg/dL — ABNORMAL HIGH (ref 0.44–1.00)
GFR calc Af Amer: 49 mL/min — ABNORMAL LOW (ref >60)
GFR calc non Af Amer: 42 mL/min — ABNORMAL LOW (ref >60)
Glucose, Bld: 133 mg/dL — ABNORMAL HIGH (ref 65–99)
Potassium: 3.8 mmol/L (ref 3.5–5.1)
Sodium: 140 mmol/L (ref 135–145)

## 2014-12-24 LAB — GLUCOSE, CAPILLARY
Glucose-Capillary: 102 mg/dL — ABNORMAL HIGH (ref 65–99)
Glucose-Capillary: 111 mg/dL — ABNORMAL HIGH (ref 65–99)
Glucose-Capillary: 127 mg/dL — ABNORMAL HIGH (ref 65–99)
Glucose-Capillary: 160 mg/dL — ABNORMAL HIGH (ref 65–99)

## 2014-12-24 MED ORDER — POLYETHYLENE GLYCOL 3350 17 G PO PACK: 17.0000 g | PACK | Freq: Every day | ORAL | Status: DC | PRN

## 2014-12-24 NOTE — Progress Notes (Addendum)
       [address]       [place] [address]             [phone]               Subjective: Stable night, no new complaints.   Objective: Vital signs in last 24 hours: Patient Vitals for the past 24 hrs:  BP Temp Temp src Pulse Resp SpO2 Weight  12/24/14 0609 (!) 135/56 mmHg 98.1 F (36.7 C) Oral 93 20 100 % 265 lb 4.8 oz (120.339 kg)  12/23/14 2047 - - - - - 97 % -  12/23/14 2002 (!) 114/40 mmHg 97.8 F (36.6 C) Oral 82 18 98 % -  12/23/14 1526 (!) 110/40 mmHg 97.5 F (36.4 C) Oral 77 17 100 % -   Current Weight  12/24/14 265 lb 4.8 oz (120.339 kg)   12/23/14 269 lb 6.4 oz (122.2 kg)          Intake/Output from previous day: 11/12 0701 - 11/13 0700 In: 240 [P.O.:240] Out: 1652 [Urine:1650; Stool:2]  CBGs [id number]   PHYSICAL EXAM:  Heart: RRR Lungs: Clear Wound: Incisions intact and dry Extremities: ++bilateral LE edema,R>L.  R calf with erythema, decreasing    Lab Results: CBC: Recent Labs  12/21/14 1900  WBC 5.6  HGB 8.6*  HCT 29.7*  PLT 240   BMET:  Recent Labs  12/21/14 1900 12/24/14 0218  NA 139 140  K 3.8 3.8  CL 96* 94*  CO2 33* 39*  GLUCOSE 157* 133*  BUN 14 17  CREATININE 1.03* 1.26*  CALCIUM 8.6* 8.5*    PT/INR: No results for input(s): LABPROT, INR in the last 72 hours.    Assessment/Plan: RLE cellulitis-  Continue Vanc/Zosyn. Vol overload- continue diuresis and watch Cr. CV- Continue current meds. DM- CBGs look better this am. Continue Levemir.   LOS: 3 days    COLLINS,GINA H 12/24/2014  Patient seen and examined, agree with above She is still volume overloaded and needs additional diuresis Her baseline creatinine last admission was ~1.5 and last creatinine prior to CABG was 1.72  Remo Lipps C. Roxan Hockey, MD [facility] [phone]

## 2014-12-24 NOTE — Progress Notes (Signed)
ANTIBIOTIC CONSULT NOTE - FOLLOW UP  Pharmacy Consult for vancomycin and Zosyn Indication: RLE cellulitis  Allergies  Allergen Reactions  . Ivp Dye [Iodinated Diagnostic Agents] Shortness Of Breath    No reaction to PO contrast with non-ionic dye.06-25-2014/rsm  . Shellfish-Derived Products Anaphylaxis  . Sulfa Antibiotics Shortness Of Breath  . Iodine Hives  . Zithromax [Azithromycin] Rash    Patient Measurements: Height: 5\' 4"  (162.6 cm) Weight: 265 lb 4.8 oz (120.339 kg) IBW/kg (Calculated) : 54.7  Vital Signs: Temp: 98.1 F (36.7 C) (11/13 0609) Temp Source: Oral (11/13 0609) BP: 135/56 mmHg (11/13 0609) Pulse Rate: 93 (11/13 0609) Intake/Output from previous day: 11/12 0701 - 11/13 0700 In: 240 [P.O.:240] Out: 1652 [Urine:1650; Stool:2] Intake/Output from this shift:    Labs:  Recent Labs  12/21/14 1900 12/24/14 0218  WBC 5.6  --   HGB 8.6*  --   PLT 240  --   CREATININE 1.03* 1.26*   Estimated Creatinine Clearance: 53.1 mL/min (by C-G formula based on Cr of 1.26).  Recent Labs  12/21/14 1900  VANCORANDOM 13     Microbiology: Recent Results (from the past 720 hour(s))  Culture, Urine     Status: None   Collection Time: 11/26/14 10:09 AM  Result Value Ref Range Status   Specimen Description URINE, CLEAN CATCH  Final   Special Requests None  Final   Culture MULTIPLE SPECIES PRESENT, SUGGEST RECOLLECTION  Final   Report Status 11/27/2014 FINAL  Final  Surgical pcr screen     Status: None   Collection Time: 11/29/14  5:39 AM  Result Value Ref Range Status   MRSA, PCR NEGATIVE NEGATIVE Final   Staphylococcus aureus NEGATIVE NEGATIVE Final    Comment:        The Xpert SA Assay (FDA approved for NASAL specimens in patients over 30 years of age), is one component of a comprehensive surveillance program.  Test performance has been validated by [facility] for patients greater than or equal to 29 year old. It is not intended to diagnose infection  nor to guide or monitor treatment.     Anti-infectives    Start     Dose/Rate Route Frequency Ordered Stop   12/21/14 2000  vancomycin (VANCOCIN) IVPB 1000 mg/200 mL premix     1,000 mg 200 mL/hr over 60 Minutes Intravenous Every 12 hours 12/21/14 1935     12/21/14 1630  piperacillin-tazobactam (ZOSYN) IVPB 3.375 g     3.375 g 12.5 mL/hr over 240 Minutes Intravenous 3 times per day 12/21/14 1628        Assessment: 69 y/o female s/p CABG at the end of October who was noted to have redness and swelling of her leg. She was started on vancomycin at her facility who sent her to the doctor to see if it was working. Zosyn was added here. Today is day 8 vancomycin and day 4 Zosyn. SCr bumped to 1.26 this morning, UOP looks ok.   Vanc 11/6(PTA)>>  *11/10 VR = 13  Zosyn 11/10>>  Goal of Therapy:  Vancomycin trough level 10-15 mcg/ml  Plan:  - Continue vancomycin 1000 mg IV q12h - Continue Zosyn 3.375 g IV q8h to be infused over 4 hours - Monitor renal function and adjust dose as needed, f/u clinical progress - Consider stopping antibiotics after 7-10 days of treatment  [facility], Pharm.D., BCPS Clinical Pharmacist Pager: [phone] 12/24/2014 1:43 PM

## 2014-12-25 LAB — GLUCOSE, CAPILLARY
Glucose-Capillary: 92 mg/dL (ref 65–99)
Glucose-Capillary: 108 mg/dL — ABNORMAL HIGH (ref 65–99)
Glucose-Capillary: 110 mg/dL — ABNORMAL HIGH (ref 65–99)
Glucose-Capillary: 219 mg/dL — ABNORMAL HIGH (ref 65–99)

## 2014-12-25 LAB — BASIC METABOLIC PANEL
Anion gap: 3 — ABNORMAL LOW (ref 5–15)
BUN: 17 mg/dL (ref 6–20)
CO2: 44 mmol/L — ABNORMAL HIGH (ref 22–32)
Calcium: 8.5 mg/dL — ABNORMAL LOW (ref 8.9–10.3)
Chloride: 91 mmol/L — ABNORMAL LOW (ref 101–111)
Creatinine, Ser: 1.35 mg/dL — ABNORMAL HIGH (ref 0.44–1.00)
GFR calc Af Amer: 45 mL/min — ABNORMAL LOW (ref >60)
GFR calc non Af Amer: 39 mL/min — ABNORMAL LOW (ref >60)
Glucose, Bld: 97 mg/dL (ref 65–99)
Potassium: 3.8 mmol/L (ref 3.5–5.1)
Sodium: 138 mmol/L (ref 135–145)

## 2014-12-25 LAB — C DIFFICILE QUICK SCREEN W PCR REFLEX
C Diff antigen: NEGATIVE
C Diff interpretation: NEGATIVE
C Diff toxin: NEGATIVE

## 2014-12-25 MED ORDER — FUROSEMIDE 10 MG/ML IJ SOLN
80.0000 mg | Freq: Two times a day (BID) | INTRAMUSCULAR | Status: DC
  Administered 2014-12-25 – 2014-12-26 (×2): 80 mg via INTRAVENOUS
  Filled 2014-12-25 (×2): qty 8

## 2014-12-25 NOTE — Progress Notes (Addendum)
      [address]       ,[place] [address]             [phone]          Subjective: Having diarrhea, feels poorly, some chills  Objective: Vital signs in last 24 hours: Temp:  [97.9 F (36.6 C)-98.2 F (36.8 C)] 97.9 F (36.6 C) (11/14 0511) Pulse Rate:  [80-81] 80 (11/14 0511) Cardiac Rhythm:  [-] Ventricular paced (11/14 0700) Resp:  [20] 20 (11/14 0511) BP: (123-131)/(54-56) 131/54 mmHg (11/14 0511) SpO2:  [93 %-98 %] 98 % (11/14 0511)  Hemodynamic parameters for last 24 hours:    Intake/Output from previous day: 11/13 0701 - 11/14 0700 In: 600 [P.O.:600] Out: 1552 [Urine:1550; Stool:2] Intake/Output this shift:    General appearance: alert, cooperative, fatigued and no distress Heart: regular rate and rhythm Lungs: dim in left base Abdomen: soft, non-tender, obese Extremities: edema and cellulitis cont to improve Wound: incis ok  Lab Results: No results for input(s): WBC, HGB, HCT, PLT in the last 72 hours. BMET:  Recent Labs  12/24/14 0218 12/25/14 0459  NA 140 138  K 3.8 3.8  CL 94* 91*  CO2 39* 44*  GLUCOSE 133* 97  BUN 17 17  CREATININE 1.26* 1.35*  CALCIUM 8.5* 8.5*    PT/INR: No results for input(s): LABPROT, INR in the last 72 hours. ABG    Component Value Date/Time   PHART 7.506* 12/03/2014 1136   HCO3 32.0* 12/03/2014 1136   TCO2 33 12/03/2014 1136   ACIDBASEDEF 4.0* 11/29/2014 1541   O2SAT 94.0 12/03/2014 1136   CBG (last 3)   Recent Labs  12/24/14 1642 12/24/14 2119 12/25/14 0614  GLUCAP 127* 160* 92    Meds Scheduled Meds: . aspirin EC  325 mg Oral Daily  . budesonide-formoterol  2 puff Inhalation BID  . carvedilol  3.125 mg Oral BID WC  . enoxaparin (LOVENOX) injection  0.5 mg/kg Subcutaneous Q24H  . fluticasone  1 spray Each Nare Daily  . furosemide  80 mg Intravenous BID  . insulin aspart  0-20 Units Subcutaneous TID WC  . insulin aspart  4 Units Subcutaneous TID WC  . insulin detemir  15  Units Subcutaneous Daily  . loratadine  10 mg Oral Daily  . pantoprazole  80 mg Oral Daily  . piperacillin-tazobactam (ZOSYN)  IV  3.375 g Intravenous 3 times per day  . potassium chloride  20 mEq Oral BID  . pravastatin  80 mg Oral Daily  . vancomycin  1,000 mg Intravenous Q12H   Continuous Infusions:  PRN Meds:.acetaminophen **OR** acetaminophen, albuterol, dicyclomine, docusate sodium, ondansetron **OR** ondansetron (ZOFRAN) IV, oxyCODONE, polyethylene glycol, sodium chloride  Xrays No results found.  Assessment/Plan:  1 frequent diarrhea- check stool for cdiff, consider empiric flagyl 2 hypochloremic- metabolic alkalosis with some increase in creat. Will hold lasix today as she is losing volume with diarrhea as well. Edema is improved 3 on vanc and zosyn for cellulitis- poss change to po abx soon 4 repeat cbc/bmet in am  LOS: 4 days    GOLD,WAYNE E 12/25/2014  Patient seen and examined She is still tremendously volume overloaded and needs aggressive diuresis Creatinine still below baseline She had some loose stools, but no watery diarrhea. c diff negative Will resume IV lasix  Remo Lipps C. Roxan Hockey, MD [facility] [phone]

## 2014-12-25 NOTE — Care Management Note (Signed)
Case Management Note  Patient Details  Name: [name] MRN: [id number] Date of Birth: [birth year]  Subjective/Objective:    Right LE cellulitis                Action/Plan: Notified CSW of pt admitted from [facility] SNF, will return to SNF with possible IV abx.   Expected Discharge Date:  12/24/2014             Expected Discharge Plan:  [place]  In-House Referral:  Clinical Social Work  Discharge planning Services  CM Consult   Status of Service:  Completed, signed off  Medicare Important Message Given:    Date Medicare IM Given:    Medicare IM give by:    Date Additional Medicare IM Given:    Additional Medicare Important Message give by:     If discussed at [place] of Stay Meetings, dates discussed:    Additional Comments:  Maryclare Labrador, RN 12/25/2014, 11:32 AM Case Management Note  Patient Details  Name: [name] MRN: [id number] Date of Birth: [birth year]  Subjective/Objective:    Pt admitted for cellulitis, pt recently discharged s/p CABG                Action/Plan:  Pt recently discharge to SNF post CABG surgery due to not have 24 hour supervision.  Pt is originally from home with 2 adult sons, sons work so 24 hour supervision is still not available.  CM requested PT consult from primary to determine current discharge needs.  CM will continue to follow for disposition needs   Expected Discharge Date:                  Expected Discharge Plan:  [place]  In-House Referral:  Clinical Social Work  Discharge planning Services  CM Consult  Post Acute Care Choice:    Choice offered to:     DME Arranged:    DME Agency:     HH Arranged:    [place] Agency:     Status of Service:  Completed, signed off  Medicare Important Message Given:    Date Medicare IM Given:    Medicare IM give by:    Date Additional Medicare IM Given:    Additional Medicare Important Message give by:     If discussed at [place] of Stay Meetings, dates discussed:    Additional Comments:  Maryclare Labrador, RN 12/25/2014, 11:32 AM

## 2014-12-25 NOTE — Care Management Important Message (Signed)
Important Message  Patient Details  Name: [name] MRN: [id number] Date of Birth: 01/06/1946   Medicare Important Message Given:  Yes    [name] [name] 12/25/2014, 1:44 PM

## 2014-12-25 NOTE — Care Management Important Message (Signed)
Important Message  Patient Details  Name: NIKKOL CONKEL MRN: [id number] Date of Birth: September 29, 1945   Medicare Important Message Given:  Yes    Maryclare Labrador, RN 12/25/2014, 12:30 PM

## 2014-12-26 DIAGNOSIS — I5033 Acute on chronic diastolic (congestive) heart failure: Secondary | ICD-10-CM

## 2014-12-26 LAB — CBC WITH DIFFERENTIAL/PLATELET
Basophils Absolute: 0 10*3/uL (ref 0.0–0.1)
Basophils Relative: 1 %
Eosinophils Absolute: 0.2 10*3/uL (ref 0.0–0.7)
Eosinophils Relative: 4 %
HCT: 29 % — ABNORMAL LOW (ref 36.0–46.0)
Hemoglobin: 8 g/dL — ABNORMAL LOW (ref 12.0–15.0)
Lymphocytes Relative: 27 %
Lymphs Abs: 1.3 10*3/uL (ref 0.7–4.0)
MCH: 25.8 pg — ABNORMAL LOW (ref 26.0–34.0)
MCHC: 27.6 g/dL — ABNORMAL LOW (ref 30.0–36.0)
MCV: 93.5 fL (ref 78.0–100.0)
Monocytes Absolute: 0.5 10*3/uL (ref 0.1–1.0)
Monocytes Relative: 11 %
Neutro Abs: 2.7 10*3/uL (ref 1.7–7.7)
Neutrophils Relative %: 57 %
Platelets: 187 10*3/uL (ref 150–400)
RBC: 3.1 MIL/uL — ABNORMAL LOW (ref 3.87–5.11)
RDW: 15.7 % — ABNORMAL HIGH (ref 11.5–15.5)
WBC: 4.8 10*3/uL (ref 4.0–10.5)

## 2014-12-26 LAB — BASIC METABOLIC PANEL
Anion gap: 9 (ref 5–15)
BUN: 16 mg/dL (ref 6–20)
CO2: 41 mmol/L — ABNORMAL HIGH (ref 22–32)
Calcium: 8.7 mg/dL — ABNORMAL LOW (ref 8.9–10.3)
Chloride: 88 mmol/L — ABNORMAL LOW (ref 101–111)
Creatinine, Ser: 1.35 mg/dL — ABNORMAL HIGH (ref 0.44–1.00)
GFR calc Af Amer: 45 mL/min — ABNORMAL LOW (ref >60)
GFR calc non Af Amer: 39 mL/min — ABNORMAL LOW (ref >60)
Glucose, Bld: 116 mg/dL — ABNORMAL HIGH (ref 65–99)
Potassium: 3.3 mmol/L — ABNORMAL LOW (ref 3.5–5.1)
Sodium: 138 mmol/L (ref 135–145)

## 2014-12-26 LAB — GLUCOSE, CAPILLARY
Glucose-Capillary: 98 mg/dL (ref 65–99)
Glucose-Capillary: 107 mg/dL — ABNORMAL HIGH (ref 65–99)
Glucose-Capillary: 137 mg/dL — ABNORMAL HIGH (ref 65–99)
Glucose-Capillary: 115 mg/dL — ABNORMAL HIGH (ref 65–99)

## 2014-12-26 LAB — BRAIN NATRIURETIC PEPTIDE: B Natriuretic Peptide: 359.7 pg/mL — ABNORMAL HIGH (ref 0.0–100.0)

## 2014-12-26 LAB — VANCOMYCIN, TROUGH: Vancomycin Tr: 36 ug/mL (ref 10.0–20.0)

## 2014-12-26 MED ORDER — FUROSEMIDE 10 MG/ML IJ SOLN
10.0000 mg/h | INTRAVENOUS | Status: DC
  Administered 2014-12-26 – 2014-12-27 (×2): 15 mg/h via INTRAVENOUS
  Filled 2014-12-26 (×3): qty 25

## 2014-12-26 MED ORDER — METOLAZONE 2.5 MG PO TABS
2.5000 mg | ORAL_TABLET | Freq: Two times a day (BID) | ORAL | Status: AC
  Administered 2014-12-26 (×2): 2.5 mg via ORAL
  Filled 2014-12-26 (×2): qty 1

## 2014-12-26 MED ORDER — POTASSIUM CHLORIDE CRYS ER 20 MEQ PO TBCR
40.0000 meq | EXTENDED_RELEASE_TABLET | Freq: Once | ORAL | Status: AC
  Administered 2014-12-26: 40 meq via ORAL
  Filled 2014-12-26: qty 2

## 2014-12-26 MED ORDER — SPIRONOLACTONE 25 MG PO TABS
25.0000 mg | ORAL_TABLET | Freq: Every day | ORAL | Status: DC
  Administered 2014-12-26 – 2015-01-01 (×7): 25 mg via ORAL
  Filled 2014-12-26 (×7): qty 1

## 2014-12-26 NOTE — Progress Notes (Signed)
Advanced Heart Failure Team Consult Note  Referring Physician: Dr Roxan Hockey Primary Physician: Primary Cardiologist:    Reason for Consultation: Heart Failure   HPI:   [name] is a 69 year old with history of morbid obesity, DM, HTN, asthma, OSA, ICD, GERD, arthritis, CRT-D 2008 [employer], CAD, S/P CABG x5 12/07/2014 admitted  with RLE cellulitis and volume overload.   Discharged from [facility] October 27th after CABG.  Discharge weight 252 pounds.(baseline weight about 240)   Readmitted from [facility] with RLE cellulitis and marked volume overload.   Complains of dyspnea and orthopnea. + edema. + postprandial ab pain. Sluggish response to IV lasix. No CP.  Echo 11/24/14 - Left ventricle: The cavity size was normal. Systolic function was normal. The estimated ejection fraction was in the range of 55% to 60%. Regional wall motion abnormalities cannot be excluded. Doppler parameters are consistent with abnormal left ventricular relaxation (grade 1 diastolic dysfunction). Doppler parameters are consistent with elevated ventricular end-diastolic filling pressure. - Ascending aorta: The ascending aorta was mildly dilated, measuring 40 mm. - Mitral valve: Calcified annulus. Mildly thickened leaflets . There was no regurgitation. - Left atrium: The atrium was mildly dilated. - Right ventricle: Pacer wire or catheter noted in right ventricle. Systolic function was normal. - Right atrium: Pacer wire or catheter noted in right atrium. - Tricuspid valve: There was mild regurgitation. - Inferior vena cava: The vessel was normal in size. - Pericardium, extracardiac: There was no pericardial effusion  Review of Systems: [y] = yes, [ ]  = no   General: Weight gain [Y ]; Weight loss [ ] ; Anorexia [ ] ; Fatigue [ ] ; Fever [ ] ; Chills [ ] ; Weakness [ ]   Cardiac: Chest pain/pressure [ ] ; Resting SOB [ y]; Exertional SOB [ ] ; Orthopnea [ Y]; Pedal Edema [ Y]; Palpitations  [ ] ; Syncope [ ] ; Presyncope [ ] ; Paroxysmal nocturnal dyspnea[ ]   Pulmonary: Cough [ ] ; Wheezing[ ] ; Hemoptysis[ ] ; Sputum [ ] ; Snoring [ ]   GI: Vomiting[ ] ; Dysphagia[ ] ; Melena[ ] ; Hematochezia [ ] ; Heartburn[ ] ; Abdominal pain Blue.Reese ]; Constipation [ ] ; Diarrhea [ ] ; BRBPR [ ]   GU: Hematuria[ ] ; Dysuria [ ] ; Nocturia[ ]   Vascular: Pain in legs with walking [ Y]; Pain in feet with lying flat [ ] ; Non-healing sores [ y]; Stroke [ ] ; TIA [ ] ; Slurred speech [ ] ;  Neuro: Headaches[ ] ; Vertigo[ ] ; Seizures[ ] ; Paresthesias[ ] ;Blurred vision [ ] ; Diplopia [ ] ; Vision changes [ ]   Ortho/Skin: Arthritis Blue.Reese ]; Joint pain [Y ]; Muscle pain [ ] ; Joint swelling [ ] ; Back Pain [ ] ; Rash [ ]   Psych: Depression[ ] ; Anxiety[ ]   Heme: Bleeding problems [ ] ; Clotting disorders [ ] ; Anemia [ ]   Endocrine: Diabetes [ Y]; Thyroid dysfunction[ ]   Home Medications Prior to Admission medications   Medication Sig Start Date End Date Taking? Authorizing Provider  albuterol (PROVENTIL HFA;VENTOLIN HFA) 108 (90 BASE) MCG/ACT inhaler Inhale 2 puffs into the lungs every 4 (four) hours as needed for wheezing (wheezing).    Yes Historical Provider, MD  aspirin EC 325 MG EC tablet Take 1 tablet (325 mg total) by mouth daily. 12/07/14  Yes Coolidge Breeze, PA-C  carvedilol (COREG) 3.125 MG tablet Take 1 tablet (3.125 mg total) by mouth 2 (two) times daily with a meal. 12/07/14  Yes Gina L Collins, PA-C  cetirizine (ZYRTEC) 10 MG tablet Take 10 mg by mouth daily.    Yes Historical Provider, MD  clotrimazole (  MYCELEX) 10 MG troche Take 10 mg by mouth every 4 (four) hours as needed (mouth pain).   Yes Historical Provider, MD  diclofenac (CATAFLAM) 50 MG tablet Take 50 mg by mouth 2 (two) times daily. 10/12/14  Yes Historical Provider, MD  dicyclomine (BENTYL) 20 MG tablet Take 1 tablet (20 mg total) by mouth 2 (two) times daily as needed (pain). 07/12/14  Yes Willia Craze, NP  docusate sodium (COLACE) 100 MG capsule Take 1 capsule  (100 mg total) by mouth daily. Patient taking differently: Take 100 mg by mouth at bedtime.  06/25/14  Yes Pamella Pert, MD  fluticasone (FLONASE) 50 MCG/ACT nasal spray Place 1 spray into the nose 2 (two) times daily as needed for rhinitis or allergies.    Yes Historical Provider, MD  furosemide (LASIX) 40 MG tablet Take 1 tablet (40 mg total) by mouth daily. 12/07/14  Yes Gina L Collins, PA-C  insulin aspart (NOVOLOG) 100 UNIT/ML injection Inject 4 Units into the skin 3 (three) times daily with meals. 12/07/14  Yes Gina L Collins, PA-C  insulin detemir (LEVEMIR) 100 UNIT/ML injection Inject 0.15 mLs (15 Units total) into the skin daily. Patient taking differently: Inject 15 Units into the skin at bedtime.  12/07/14  Yes Coolidge Breeze, PA-C  isosorbide mononitrate (IMDUR) 30 MG 24 hr tablet Take 30 mg by mouth daily. 10/12/14  Yes Historical Provider, MD  loperamide (IMODIUM A-D) 2 MG tablet Take 2 mg by mouth 4 (four) times daily as needed for diarrhea or loose stools (diarrhea).   Yes Historical Provider, MD  omeprazole (PRILOSEC) 40 MG capsule Take 40 mg by mouth daily.  07/19/13  Yes Historical Provider, MD  ondansetron (ZOFRAN) 8 MG tablet Take 1 tablet (8 mg total) by mouth every 8 (eight) hours as needed for nausea or vomiting. 07/07/14  Yes Daleen Bo, MD  oxyCODONE (OXY IR/ROXICODONE) 5 MG immediate release tablet Take 1-2 tablets (5-10 mg total) by mouth every 3 (three) hours as needed for severe pain. 12/07/14  Yes Gina L Collins, PA-C  potassium chloride SA (K-DUR,KLOR-CON) 20 MEQ tablet Take 20 mEq by mouth daily.    Yes Historical Provider, MD  pravastatin (PRAVACHOL) 80 MG tablet Take 1 tablet (80 mg total) by mouth every evening. 10/31/14  Yes [name], MD  spironolactone (ALDACTONE) 25 MG tablet Take 25 mg by mouth daily. 11/10/14  Yes Historical Provider, MD  SYMBICORT 160-4.5 MCG/ACT inhaler Inhale 2 puffs into the lungs 2 (two) times daily. 12/02/13  Yes Historical Provider,  MD  triamcinolone cream (KENALOG) 0.1 % Apply 1 application topically daily.  08/24/14  Yes Historical Provider, MD  vancomycin 1,000 mg in sodium chloride 0.9 % 250 mL Inject 1,000 mg into the vein every 12 (twelve) hours. X 7 days started 12/17/14   Yes Historical Provider, MD    Past Medical History: Past Medical History  Diagnosis Date  . Asthma   . Hypertension   . Arthritis   . GERD (gastroesophageal reflux disease)   . Diverticulosis   . Anemia   . CAD (coronary artery disease)   . Fatty liver disease, nonalcoholic   . Internal hemorrhoids   . Gastritis   . Hyperplastic colon polyp   . OSA (obstructive sleep apnea)   . ICD (implantable cardiac defibrillator) in place   . Shortness of breath   . IBS (irritable bowel syndrome)   . H/O hiatal hernia   . Obesity   . Diabetes mellitus without complication ([place])   .  Cellulitis and abscess of foot 12/2014    RT FOOT    Past Surgical History: Past Surgical History  Procedure Laterality Date  . Pacemaker insertion  2001    BS BivICD  . Cardiac defibrillator placement    . Appendectomy    . Knee surgery      right  . Ankle surgery      right  . Tubal ligation    . Cervical spine surgery      plate   . Coronary angiogram  2010  . Abdominal ultrasound  12/01/2011    Peripelvic cysts- #1- 2.4x1.9x2.3cm, #2-1.2x0.9x1.2cm  . Cardiac catheterization  05/17/1999    No significant coronary obstructive disease w/ mild 20% luminal irregularity of the first diag branch of the LAD  . Cardiac catheterization  07/08/2002    No significant CAD, moderately depressed LV systolic function  . Cardiac catheterization Bilateral 04/26/2007    Normal findings, recommend medical therapy  . Cardiac catheterization  02/18/2008    Moderate CAD, would benefit from having a functional study, recommend continue medical therapy  . Cardiac catheterization  07/23/2012    Medical therapy  . Lexiscan myoview  11/14/2011    Mild-moderate defect seen in Mid  Inferolateral and Mid Anterolateral regions-consistant w/ infarct/scar. No significant ischemia demonstrated.  . Transthoracic echocardiogram  07/23/2012    EF 55-60%, normal-mild  . Left heart catheterization with coronary angiogram N/A 07/23/2012    Procedure: LEFT HEART CATHETERIZATION WITH CORONARY ANGIOGRAM;  Surgeon: Leonie Man, MD;  Location: [facility] CATH LAB;  Service: Cardiovascular;  Laterality: N/A;  . Biv pacemaker generator change out N/A 11/02/2012    Procedure: BIV PACEMAKER GENERATOR CHANGE OUT;  Surgeon: Sanda Klein, MD;  Location: [place] CATH LAB;  Service: Cardiovascular;  Laterality: N/A;  . Cardiac catheterization N/A 11/24/2014    Procedure: Left Heart Cath and Coronary Angiography;  Surgeon: Troy Sine, MD;  Location: [place] CV LAB;  Service: Cardiovascular;  Laterality: N/A;  . Cardiac catheterization  11/27/2014    Procedure: Intravascular Pressure Wire/FFR Study;  Surgeon: Peter M Martinique, MD;  Location: [place] CV LAB;  Service: Cardiovascular;;  . Coronary artery bypass graft N/A 11/29/2014    Procedure: CORONARY ARTERY BYPASS GRAFTING x 5 (LIMA-LAD, SVG-D, SVG-OM1-OM2, SVG-PD);  Surgeon: Melrose Nakayama, MD;  Location: [place];  Service: Open Heart Surgery;  Laterality: N/A;  . Tee without cardioversion N/A 11/29/2014    Procedure: TRANSESOPHAGEAL ECHOCARDIOGRAM (TEE);  Surgeon: Melrose Nakayama, MD;  Location: [place];  Service: Open Heart Surgery;  Laterality: N/A;    Family History: Family History  Problem Relation Age of Onset  . Breast cancer Mother   . Diabetes Mother   . Heart disease Maternal Grandmother   . Kidney disease Maternal Grandmother   . Diabetes Maternal Grandmother   . Glaucoma Maternal Aunt   . Heart disease Maternal Aunt   . Asthma Sister     Social History: Social History   Social History  . Marital Status: Widowed    Spouse Name: N/A  . Number of Children: 5  . Years of Education: N/A   Occupational History  .  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 Smoking status: Former Smoker -- 0.25 packs/day for 3 years    Types: Cigarettes    Quit date: 02/11/1968  . Smokeless tobacco: Never Used  . Alcohol Use: No  . Drug Use: No  . Sexual Activity: No   Other  Topics Concern  . None   Social History Narrative   Widowed last year. She lives with her two sons.    Allergies:  Allergies  Allergen Reactions  . Ivp Dye [Iodinated Diagnostic Agents] Shortness Of Breath    No reaction to PO contrast with non-ionic dye.06-25-2014/rsm  . Shellfish-Derived Products Anaphylaxis  . Sulfa Antibiotics Shortness Of Breath  . Iodine Hives  . Zithromax [Azithromycin] Rash    Objective:    Vital Signs:   Temp:  [97.7 F (36.5 C)-98.1 F (36.7 C)] 97.9 F (36.6 C) (11/15 0519) Pulse Rate:  [72-89] 72 (11/15 0519) Resp:  [17-18] 18 (11/15 0519) BP: (108-125)/(53-59) 116/58 mmHg (11/15 0519) SpO2:  [98 %-100 %] 99 % (11/15 0519) Weight:  [265 lb 10.5 oz (120.5 kg)] 265 lb 10.5 oz (120.5 kg) (11/15 0500) Last BM Date: 12/26/14  Weight change: Filed Weights   12/23/14 0434 12/24/14 0609 12/26/14 0500  Weight: 269 lb 6.4 oz (122.2 kg) 265 lb 4.8 oz (120.339 kg) 265 lb 10.5 oz (120.5 kg)    Intake/Output:   Intake/Output Summary (Last 24 hours) at 12/26/14 0837 Last data filed at 12/25/14 2010  Gross per 24 hour  Intake    582 ml  Output   1700 ml  Net  -1118 ml     Physical Exam: General:  Morbidly obese  No resp difficulty. In the chair  HEENT: normal Neck: supple. JVP hard to see . Carotids 2+ bilat; no bruits. No lymphadenopathy or thryomegaly appreciated. Cor: PMI nonpalpable. Regular rate & rhythm. No rubs, gallops or murmurs. Lungs: clear Abdomen: markedly obese soft, nontender, nondistended. No hepatosplenomegaly. No bruits or masses. Good bowel sounds. Extremities: no cyanosis, clubbing, rash, R and LLE 3+ edema. RLE kerlix in place Neuro: alert &  orientedx3, cranial nerves grossly intact. moves all 4 extremities w/o difficulty. Affect pleasant  Telemetry: AV paced 70s   Labs: Basic Metabolic Panel:  Recent Labs Lab 12/21/14 1900 12/24/14 0218 12/25/14 0459 12/26/14 0410  NA 139 140 138 138  K 3.8 3.8 3.8 3.3*  CL 96* 94* 91* 88*  CO2 33* 39* 44* 41*  GLUCOSE 157* 133* 97 116*  BUN 14 17 17 16   CREATININE 1.03* 1.26* 1.35* 1.35*  CALCIUM 8.6* 8.5* 8.5* 8.7*    Liver Function Tests: No results for input(s): AST, ALT, ALKPHOS, BILITOT, PROT, ALBUMIN in the last 168 hours. No results for input(s): LIPASE, AMYLASE in the last 168 hours. No results for input(s): AMMONIA in the last 168 hours.  CBC:  Recent Labs Lab 12/21/14 1900 12/26/14 0410  WBC 5.6 4.8  NEUTROABS  --  2.7  HGB 8.6* 8.0*  HCT 29.7* 29.0*  MCV 94.0 93.5  PLT 240 187    Cardiac Enzymes: No results for input(s): CKTOTAL, CKMB, CKMBINDEX, TROPONINI in the last 168 hours.  BNP: BNP (last 3 results)  Recent Labs  11/23/14 1744 12/26/14 0410  BNP 68.0 359.7*    ProBNP (last 3 results) No results for input(s): PROBNP in the last 8760 hours.   CBG:  Recent Labs Lab 12/25/14 0614 12/25/14 1140 12/25/14 1630 12/25/14 2130 12/26/14 0603  GLUCAP 92 108* 110* 219* 98    Coagulation Studies: No results for input(s): LABPROT, INR in the last 72 hours.  Other results: EKG: sinus rhythm v-pacing at 75  Imaging:  No results found.   Medications:     Current Medications: . aspirin EC  325 mg Oral Daily  . budesonide-formoterol  2 puff Inhalation  BID  . carvedilol  3.125 mg Oral BID WC  . enoxaparin (LOVENOX) injection  0.5 mg/kg Subcutaneous Q24H  . fluticasone  1 spray Each Nare Daily  . furosemide  80 mg Intravenous BID  . insulin aspart  0-20 Units Subcutaneous TID WC  . insulin aspart  4 Units Subcutaneous TID WC  . insulin detemir  15 Units Subcutaneous Daily  . loratadine  10 mg Oral Daily  . pantoprazole  80 mg  Oral Daily  . piperacillin-tazobactam (ZOSYN)  IV  3.375 g Intravenous 3 times per day  . potassium chloride  40 mEq Oral Once  . pravastatin  80 mg Oral Daily  . vancomycin  1,000 mg Intravenous Q12H     Infusions:      Assessment:   1. Acute on chronic diastolic HF   --EF [id number]. RV normal 2. CAD s/p CABG 3. Ab pain  4. Morbid obesity 5. H/o heart block s/p BiV pacer 6. Cellulitis R foot  Plan/Discussion:    See below  Length of Stay: 5  [name] 12/26/2014, 8:37 AM  Advanced Heart Failure Team Pager [phone] (M-F; 7a - 4p)  Please contact [facility] for night-coverage after hours (4p -7a ) and weekends on [email or web address]  Patient seen and examined with Alicia Grinder, NP. We discussed all aspects of the encounter. I agree with the assessment and plan as stated above.   She is massively volume overloaded with poor response to bolus lasix. Will start lasix drip at 15/hr. Add metolazone and spiro. Place Foley. Will need extensive education about dietary restriction. Can repeat echo as needed.   Alicia Abboud,MD 9:54 AM

## 2014-12-26 NOTE — Clinical Documentation Improvement (Signed)
Cardiology  Abnormal Lab/Test Results:  Serum Creatinine's have increase from 1.03 on admission to 1.35 in a 48 hour time frame. Please provide a diagnosis associated with labs and treatment provided. Please update your documentation within the medical record to reflect your response to this query. Do not document findings in BPA drop down box.Thank you!  Possible Clinical Conditions associated with above indicators  ARF  CKD - identify stage. GFR's running 39 to 54 this admission  Other Condition  Cannot Clinically Determine  Supporting Information:  Patient volume overloaded. Being treated with IV Lasix.  Patient with diarrhea  Please exercise your independent, professional judgment when responding. A specific answer is not anticipated or expected.  Thank You,  Zoila Shutter RN, [place] [phone]; Cell: [phone]
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
## 2014-12-26 NOTE — Evaluation (Signed)
Physical Therapy Evaluation Patient Details Name: ANTONIO SUPPA MRN: [id number] DOB: [birth year] Today's Date: 12/26/2014   History of Present Illness  pt is a 69 y/o female with h/o recent CABG, DM, cardiomyopathy admitted from nursing facility for check and management of LE cellulitis.  Clinical Impression  Pt admitted with/for LE cellulitis.  Pt currently limited functionally due to the problems listed below.  (see problems list.)  Pt will benefit from PT to maximize function and safety to be able to get home safely with available assist of family.     Follow Up Recommendations Home health PT;Supervision/Assistance - 24 hour    Equipment Recommendations  Other (comment)    Recommendations for Other Services       Precautions / Restrictions        Mobility  Bed Mobility                  Transfers Overall transfer level: Needs assistance Equipment used: None (rail) Transfers: Sit to/from Stand Sit to Stand: Min guard         General transfer comment: cued for sternal precautions  Ambulation/Gait Ambulation/Gait assistance: Min guard Ambulation Distance (Feet): 140 Feet Assistive device: None Gait Pattern/deviations: Step-through pattern;Antalgic;Staggering left Gait velocity: slower   General Gait Details: mildly unsteady, tentative and staggery on the left  Stairs            Wheelchair Mobility    Modified Rankin (Stroke Patients Only)       Balance Overall balance assessment: Needs assistance Sitting-balance support: No upper extremity supported Sitting balance-Leahy Scale: Good     Standing balance support: No upper extremity supported Standing balance-Leahy Scale: Fair                               Pertinent Vitals/Pain Pain Assessment: Faces Faces Pain Scale: Hurts a little bit Pain Location: leg Pain Descriptors / Indicators:  (sensitive) Pain Intervention(s): Monitored during session    Home Living  Family/patient expects to be discharged to:: Private residence Living Arrangements: Children;Other (Comment);Other relatives (2 sons, a Counsellor and 2 babies.) Available Help at Discharge: Available PRN/intermittently;Family Type of Home: House Home Access: Stairs to enter Entrance Stairs-Rails: Psychiatric nurse of Steps: several Home Layout: Two level;Full bath on main level;Able to live on main level with bedroom/bathroom        Prior Function Level of Independence: Independent               Hand Dominance        Extremity/Trunk Assessment   Upper Extremity Assessment: Defer to OT evaluation           Lower Extremity Assessment: Overall WFL for tasks assessed;Generalized weakness (mild weakness L more than R LE)         Communication   Communication: No difficulties  Cognition Arousal/Alertness: Awake/alert Behavior During Therapy: WFL for tasks assessed/performed Overall Cognitive Status: Within Functional Limits for tasks assessed                      General Comments      Exercises        Assessment/Plan    PT Assessment Patient needs continued PT services  PT Diagnosis Abnormality of gait;Generalized weakness   PT Problem List Decreased strength;Decreased activity tolerance;Decreased mobility;Decreased balance;Decreased knowledge of use of DME;Cardiopulmonary status limiting activity  PT Treatment Interventions DME instruction;Gait training;Stair training;Functional mobility training;Therapeutic activities;Balance training;Patient/family  education   PT Goals (Current goals can be found in the Care Plan section) Acute Rehab PT Goals Patient Stated Goal: be independent PT Goal Formulation: With patient Time For Goal Achievement: 01/02/15 Potential to Achieve Goals: Good    Frequency Min 3X/week   Barriers to discharge        Co-evaluation               End of Session   Activity Tolerance: Patient  tolerated treatment well;Patient limited by fatigue   Nurse Communication: Mobility status         Time: [id number] PT Time Calculation (min) (ACUTE ONLY): 26 min   Charges:   PT Evaluation $Initial PT Evaluation Tier I: 1 Procedure PT Treatments $Gait Training: 8-22 mins   PT G Codes:        Lyrique Hakim, Tessie Fass 12/26/2014, 6:12 PM 12/26/2014  Donnella Sham, PT [phone]  (pager)

## 2014-12-26 NOTE — Progress Notes (Signed)
ANTIBIOTIC CONSULT NOTE - FOLLOW UP  Pharmacy Consult for vancomycin and Zosyn Indication: RLE cellulitis  Allergies  Allergen Reactions  . Ivp Dye [Iodinated Diagnostic Agents] Shortness Of Breath    No reaction to PO contrast with non-ionic dye.06-25-2014/rsm  . Shellfish-Derived Products Anaphylaxis  . Sulfa Antibiotics Shortness Of Breath  . Iodine Hives  . Zithromax [Azithromycin] Rash    Patient Measurements: Height: 5\' 4"  (162.6 cm) Weight: 265 lb 10.5 oz (120.5 kg) IBW/kg (Calculated) : 54.7  Vital Signs: Temp: 98.6 F (37 C) (11/15 2017) Temp Source: Oral (11/15 2017) BP: 113/50 mmHg (11/15 2017) Pulse Rate: 79 (11/15 2017) Intake/Output from previous day: 11/14 0701 - 11/15 0700 In: 702 [P.O.:702] Out: 1700 [Urine:1700] Intake/Output from this shift: Total I/O In: -  Out: 1100 [Urine:1100]  Labs:  Recent Labs  12/24/14 0218 12/25/14 0459 12/26/14 0410  WBC  --   --  4.8  HGB  --   --  8.0*  PLT  --   --  187  CREATININE 1.26* 1.35* 1.35*   Estimated Creatinine Clearance: 49.6 mL/min (by C-G formula based on Cr of 1.35).  Recent Labs  12/26/14 2000  [facility] 8*     Microbiology: Recent Results (from the past 720 hour(s))  Surgical pcr screen     Status: None   Collection Time: 11/29/14  5:39 AM  Result Value Ref Range Status   MRSA, PCR NEGATIVE NEGATIVE Final   Staphylococcus aureus NEGATIVE NEGATIVE Final    Comment:        The Xpert SA Assay (FDA approved for NASAL specimens in patients over 10 years of age), is one component of a comprehensive surveillance program.  Test performance has been validated by [facility] for patients greater than or equal to 25 year old. It is not intended to diagnose infection nor to guide or monitor treatment.   C difficile quick scan w PCR reflex     Status: None   Collection Time: 12/25/14  9:04 AM  Result Value Ref Range Status   C Diff antigen NEGATIVE NEGATIVE Final   C Diff toxin  NEGATIVE NEGATIVE Final   C Diff interpretation Negative for toxigenic C. difficile  Final    Anti-infectives    Start     Dose/Rate Route Frequency Ordered Stop   12/21/14 2000  vancomycin (VANCOCIN) IVPB 1000 mg/200 mL premix  Status:  Discontinued     1,000 mg 200 mL/hr over 60 Minutes Intravenous Every 12 hours 12/21/14 1935 12/26/14 2128   12/21/14 1630  piperacillin-tazobactam (ZOSYN) IVPB 3.375 g     3.375 g 12.5 mL/hr over 240 Minutes Intravenous 3 times per day 12/21/14 1628        Assessment: 69 y/o female s/p CABG at the end of October who was noted to have redness and swelling of her leg. She was started on vancomycin at her facility who sent her to the doctor to see if it was working. Zosyn was added here. Today is day 10 vancomycin and day 6 Zosyn. SCr bumped to 1 > 1.35.    Vanc 11/6(PTA)>>  *11/10 VR = 13 11/15 VT 36 - on PTA dose 1gm q12 all doses charted  Zosyn 11/10>>  Goal of Therapy:  Vancomycin trough level 10-15 mcg/ml  Plan:  Hold vancomycin  Check VR in am to determine clearance and new dose - Continue Zosyn 3.375 g IV q8h to be infused over 4 hours - Monitor renal function and adjust dose as needed,  f/u clinical progress - Consider stopping antibiotics after 7-10 days of treatment  Bonnita Nasuti Pharm.D. CPP, BCPS Clinical Pharmacist [phone] 12/26/2014 9:30 PM

## 2014-12-26 NOTE — Care Management Note (Signed)
Case Management Note  Patient Details  Name: [name] MRN: [id number] Date of Birth: 25-Oct-1945  Subjective/Objective: Pt admitted with cellultius                   Action/Plan:  Pt recent s/p CABG and discharged to SNF due to not having 24 hour supervision.  Pt now has son [name] available to provide 24 hour supervision; both pt and son are requesting to not return to [facility], plan is for pt to return home with home health.  CM requested PT evaluation and will continue to monitor for disposition needs.    Expected Discharge Date:                  Expected Discharge Plan:  [place]  In-House Referral:  Clinical Social Work  Discharge planning Services  CM Consult  Post Acute Care Choice:    Choice offered to:     DME Arranged:    DME Agency:     HH Arranged:    [place] Agency:     Status of Service:  In process, will continue to follow  Medicare Important Message Given:  Yes Date Medicare IM Given:    Medicare IM give by:    Date Additional Medicare IM Given:    Additional Medicare Important Message give by:     If discussed at [place] of Stay Meetings, dates discussed:    Additional Comments:  Maryclare Labrador, RN 12/26/2014, 4:00 PM

## 2014-12-26 NOTE — Progress Notes (Addendum)
  Subjective: Feels poorly, weak, short of breath(mildly) , less diarrhea, no appetitie  Objective: Vital signs in last 24 hours: Temp:  [97.7 F (36.5 C)-98.1 F (36.7 C)] 97.9 F (36.6 C) (11/15 0519) Pulse Rate:  [72-89] 72 (11/15 0519) Cardiac Rhythm:  [-] Ventricular paced;Bundle branch block (11/15 0700) Resp:  [17-18] 18 (11/15 0519) BP: (108-125)/(53-59) 116/58 mmHg (11/15 0519) SpO2:  [98 %-100 %] 99 % (11/15 0519) Weight:  [265 lb 10.5 oz (120.5 kg)] 265 lb 10.5 oz (120.5 kg) (11/15 0500)  Hemodynamic parameters for last 24 hours:    Intake/Output from previous day: 11/14 0701 - 11/15 0700 In: 702 [P.O.:702] Out: 1700 [Urine:1700] Intake/Output this shift:    General appearance: alert, cooperative, fatigued and no distress Heart: regular rate and rhythm Lungs: dim in left base Abdomen: benign Extremities: + BLE edema, erethema RLE conts to improve, primarily a brawny edema Wound: incis healing well  Lab Results:  Recent Labs  12/26/14 0410  WBC 4.8  HGB 8.0*  HCT 29.0*  PLT 187   BMET:  Recent Labs  12/25/14 0459 12/26/14 0410  NA 138 138  K 3.8 3.3*  CL 91* 88*  CO2 44* 41*  GLUCOSE 97 116*  BUN 17 16  CREATININE 1.35* 1.35*  CALCIUM 8.5* 8.7*    PT/INR: No results for input(s): LABPROT, INR in the last 72 hours. ABG    Component Value Date/Time   PHART 7.506* 12/03/2014 1136   HCO3 32.0* 12/03/2014 1136   TCO2 33 12/03/2014 1136   ACIDBASEDEF 4.0* 11/29/2014 1541   O2SAT 94.0 12/03/2014 1136   CBG (last 3)   Recent Labs  12/25/14 1630 12/25/14 2130 12/26/14 0603  GLUCAP 110* 219* 98    Meds Scheduled Meds: . aspirin EC  325 mg Oral Daily  . budesonide-formoterol  2 puff Inhalation BID  . carvedilol  3.125 mg Oral BID WC  . enoxaparin (LOVENOX) injection  0.5 mg/kg Subcutaneous Q24H  . fluticasone  1 spray Each Nare Daily  . furosemide  80 mg Intravenous BID  . insulin aspart  0-20 Units Subcutaneous TID WC  . insulin  aspart  4 Units Subcutaneous TID WC  . insulin detemir  15 Units Subcutaneous Daily  . loratadine  10 mg Oral Daily  . pantoprazole  80 mg Oral Daily  . piperacillin-tazobactam (ZOSYN)  IV  3.375 g Intravenous 3 times per day  . pravastatin  80 mg Oral Daily  . vancomycin  1,000 mg Intravenous Q12H   Continuous Infusions:  PRN Meds:.acetaminophen **OR** acetaminophen, albuterol, dicyclomine, docusate sodium, ondansetron **OR** ondansetron (ZOFRAN) IV, oxyCODONE, polyethylene glycol, sodium chloride  Xrays No results found.  Assessment/Plan:  1 cellulitis clinically improving 2 edema improving  3 diarrhea improving 4 creat stable 5 hypokalemia- replace K+ 6 hypochloremic alkalosis is relatively stable- monitor in setting of diuresis. p BNP is elevated , albumin/protein status may be contributing to third spacing 7 sugars reasonably stable but monitor closely as dietary intake has decreased to very little  LOS: 5 days    [name] 12/26/2014  Patient seen and examined, agree with above I have asked the heart failure team to take a look at her and offer any suggestions they may have  Remo Lipps C. Roxan Hockey, MD [facility] [phone]

## 2014-12-27 LAB — BASIC METABOLIC PANEL
Anion gap: 7 (ref 5–15)
BUN: 13 mg/dL (ref 6–20)
CO2: 49 mmol/L — ABNORMAL HIGH (ref 22–32)
Calcium: 9.2 mg/dL (ref 8.9–10.3)
Chloride: 81 mmol/L — ABNORMAL LOW (ref 101–111)
Creatinine, Ser: 1.49 mg/dL — ABNORMAL HIGH (ref 0.44–1.00)
GFR calc Af Amer: 40 mL/min — ABNORMAL LOW (ref >60)
GFR calc non Af Amer: 34 mL/min — ABNORMAL LOW (ref >60)
Glucose, Bld: 108 mg/dL — ABNORMAL HIGH (ref 65–99)
Potassium: 3 mmol/L — ABNORMAL LOW (ref 3.5–5.1)
Sodium: 137 mmol/L (ref 135–145)

## 2014-12-27 LAB — PREALBUMIN: Prealbumin: 19.1 mg/dL (ref 18–38)

## 2014-12-27 LAB — GLUCOSE, CAPILLARY
Glucose-Capillary: 111 mg/dL — ABNORMAL HIGH (ref 65–99)
Glucose-Capillary: 95 mg/dL (ref 65–99)
Glucose-Capillary: 157 mg/dL — ABNORMAL HIGH (ref 65–99)
Glucose-Capillary: 143 mg/dL — ABNORMAL HIGH (ref 65–99)

## 2014-12-27 LAB — ALBUMIN: Albumin: 2.8 g/dL — ABNORMAL LOW (ref 3.5–5.0)

## 2014-12-27 LAB — VANCOMYCIN, RANDOM: Vancomycin Rm: 36 ug/mL

## 2014-12-27 MED ORDER — FUROSEMIDE 10 MG/ML IJ SOLN
6.0000 mg/h | INTRAMUSCULAR | Status: DC
Start: 2014-12-27 — End: 2014-12-28
  Filled 2014-12-27: qty 25

## 2014-12-27 MED ORDER — ZOLPIDEM TARTRATE 5 MG PO TABS: 5.0000 mg | ORAL_TABLET | Freq: Every evening | ORAL | Status: DC | PRN

## 2014-12-27 MED ORDER — POTASSIUM CHLORIDE CRYS ER 20 MEQ PO TBCR
40.0000 meq | EXTENDED_RELEASE_TABLET | Freq: Three times a day (TID) | ORAL | Status: DC
  Administered 2014-12-27 (×3): 40 meq via ORAL
  Filled 2014-12-27 (×3): qty 2

## 2014-12-27 MED ORDER — POTASSIUM CHLORIDE CRYS ER 20 MEQ PO TBCR: 40.0000 meq | EXTENDED_RELEASE_TABLET | Freq: Three times a day (TID) | ORAL | Status: DC

## 2014-12-27 MED ORDER — HYDROCORTISONE 2.5 % RE CREA
TOPICAL_CREAM | Freq: Two times a day (BID) | RECTAL | Status: DC
  Administered 2014-12-27: 1 via RECTAL
  Administered 2014-12-27 – 2014-12-29 (×3): via RECTAL
  Administered 2014-12-30 (×2): 1 via RECTAL
  Administered 2014-12-31 (×2): via RECTAL
  Filled 2014-12-27 (×2): qty 28.35

## 2014-12-27 NOTE — Progress Notes (Signed)
CARDIAC REHAB PHASE I   PRE:  Rate/Rhythm: 72 paced  BP:  Lying: 120/57        SaO2: 94 2L  MODE:  Ambulation: 140 ft   POST:  Rate/Rhythm: 102 paced  BP:  Sitting: 129/52         SaO2: 93 2L  Pt ambulated 140 ft on 2L O2, rolling walker, IV, foley catheter, assist x2, fairly steady gait, tolerated well. Pt c/o DOE, leg discomfort, denies dizziness, brief standing rest x1. Pt requires significant encouragement to ambulate, but one she is up, does well. Pt appreciative of walk, to side of bed after walk, reading newspaper, call bell within reach. Encouraged IS. Pt verbalized understanding. Will follow-up tomorrow. Can be assist x1.   [id number]  Lenna Sciara, RN, BSN 12/27/2014 3:08 PM

## 2014-12-27 NOTE — Progress Notes (Signed)
ANTIBIOTIC CONSULT NOTE - FOLLOW UP  Pharmacy Consult for vancomycin and Zosyn Indication: RLE cellulitis  Allergies  Allergen Reactions  . Ivp Dye [Iodinated Diagnostic Agents] Shortness Of Breath    No reaction to PO contrast with non-ionic dye.06-25-2014/rsm  . Shellfish-Derived Products Anaphylaxis  . Sulfa Antibiotics Shortness Of Breath  . Iodine Hives  . Zithromax [Azithromycin] Rash    Patient Measurements: Height: 5\' 4"  (162.6 cm) Weight: 255 lb 1.6 oz (115.713 kg) IBW/kg (Calculated) : 54.7  Vital Signs: Temp: 98.3 F (36.8 C) (11/16 0511) Temp Source: Oral (11/16 0511) BP: 95/42 mmHg (11/16 0511) Pulse Rate: 86 (11/16 0511) Intake/Output from previous day: 11/15 0701 - 11/16 0700 In: 967 [P.O.:530; I.V.:87; IV Piggyback:350] Out: [id number] [Urine:7525] Intake/Output from this shift: Total I/O In: 240 [P.O.:240] Out: 800 [Urine:800]  Labs:  Recent Labs  12/25/14 0459 12/26/14 0410 12/27/14 0425  WBC  --  4.8  --   HGB  --  8.0*  --   PLT  --  187  --   CREATININE 1.35* 1.35* 1.49*   Estimated Creatinine Clearance: 43.9 mL/min (by C-G formula based on Cr of 1.49).  Recent Labs  12/26/14 2000 12/27/14 0425  VANCOTROUGH 68*  --   VANCORANDOM  --  57     Microbiology: Recent Results (from the past 720 hour(s))  Surgical pcr screen     Status: None   Collection Time: 11/29/14  5:39 AM  Result Value Ref Range Status   MRSA, PCR NEGATIVE NEGATIVE Final   Staphylococcus aureus NEGATIVE NEGATIVE Final    Comment:        The Xpert SA Assay (FDA approved for NASAL specimens in patients over 47 years of age), is one component of a comprehensive surveillance program.  Test performance has been validated by [facility] for patients greater than or equal to 63 year old. It is not intended to diagnose infection nor to guide or monitor treatment.   C difficile quick scan w PCR reflex     Status: None   Collection Time: 12/25/14  9:04 AM  Result Value  Ref Range Status   C Diff antigen NEGATIVE NEGATIVE Final   C Diff toxin NEGATIVE NEGATIVE Final   C Diff interpretation Negative for toxigenic C. difficile  Final    Anti-infectives    Start     Dose/Rate Route Frequency Ordered Stop   12/21/14 2000  vancomycin (VANCOCIN) IVPB 1000 mg/200 mL premix  Status:  Discontinued     1,000 mg 200 mL/hr over 60 Minutes Intravenous Every 12 hours 12/21/14 1935 12/26/14 2128   12/21/14 1630  piperacillin-tazobactam (ZOSYN) IVPB 3.375 g     3.375 g 12.5 mL/hr over 240 Minutes Intravenous 3 times per day 12/21/14 1628        Assessment: 69 y/o female s/p CABG at the end of October admitted 12/21/2014 for redness and swelling of her leg. She was started on vancomycin at her facility prior to admission. Zosyn added upon admission to [facility].   Vanc random remains supratherapeutic at 36 mcg/ml this AM likely d/t accumulation over time given weight and extended duration of vancomycin therapy.  No cultures were drawn on admission prior to Zosyn initiation. Remains afebrile, WBC wnl. No s/sx of systemic infection throughout admission. Per 2014 IDSA SSTI guidelines, typical cellulitis should treat for streptococci and recommended duration of treatment is 5 days.    Given that pt has had 11 days of abx treatment and continues to show no  systemic signs of infection, recommend stopping antibiotics.  Vanc 11/6(PTA) >> Day #11  *11/10 VR = 13 *11/15 VT = 36 *11/16 VR = 36  Zosyn 11/10 >> Day #7  11/14 CDiff neg  Goal of Therapy:  Vancomycin trough level 10-15 mcg/ml  Plan:  Continue to hold vancomycin  Random vanc level in AM Zosyn 3.375 g IV q8h to be infused over 4 hours Consider stopping antibiotics    Heloise Ochoa, Pharm.D. PGY2 Pharmacy Resident Pager: [phone]  12/27/2014 1:08 PM

## 2014-12-27 NOTE — Care Management Note (Signed)
Case Management Note  Patient Details  Name: [name] MRN: [id number] Date of Birth: 21-Oct-1945  Subjective/Objective: Pt admitted with cellultius                   Action/Plan:  Pt recent s/p CABG and discharged to SNF due to not having 24 hour supervision.  Pt now has son [name] available to provide 24 hour supervision; both pt and son are requesting to not return to [facility], plan is for pt to return home with home health.  CM requested PT evaluation and will continue to monitor for disposition needs.    Expected Discharge Date:                  Expected Discharge Plan:  [place]  In-House Referral:  Clinical Social Work  Discharge planning Services  CM Consult  Post Acute Care Choice:    Choice offered to:     DME Arranged:    DME Agency:     HH Arranged:    [place] Agency:     Status of Service:  In process, will continue to follow  Medicare Important Message Given:  Yes Date Medicare IM Given:    Medicare IM give by:    Date Additional Medicare IM Given:    Additional Medicare Important Message give by:     If discussed at [place] of Stay Meetings, dates discussed:    Additional Comments: 12/27/2014 Pt has been placed on Lasix drip per HF team consult.  CM will continue to monitor for disposition needs  11/151/6 PT evaluated pt 12/26/14; requested to evaluate pt again before confirming recommendations Maryclare Labrador, RN 12/27/2014, 12:21 PM

## 2014-12-27 NOTE — Progress Notes (Signed)
RT spoke to patient about wearing CPAP and patient states that she doesn't wear CPAP at home and she will not be wearing CPAP tonight. RT advised patient to contact RN if she changes her mind. RT will continue to monitor.

## 2014-12-27 NOTE — Progress Notes (Signed)
Physical Therapy Treatment Patient Details Name: [name] MRN: [id number] DOB: Apr 22, 1945 Today's Date: 12/27/2014    History of Present Illness pt is a 69 y/o female with h/o recent CABG, DM, cardiomyopathy admitted from nursing facility for check and management of LE cellulitis.    PT Comments    Progressing steadily.  For optimal safety, needs RW.  Will also need oxygen for home if pulmonary status is slow to improve.  Follow Up Recommendations  Home health PT;Supervision/Assistance - 24 hour     Equipment Recommendations  Rolling walker with 5" wheels;Other (comment) (and portable O2)    Recommendations for Other Services       Precautions / Restrictions Precautions Precautions: Fall;Sternal Precaution Comments: reinforced sternal precautions Restrictions Weight Bearing Restrictions: No    Mobility  Bed Mobility               General bed mobility comments: OOB already  Transfers Overall transfer level: Needs assistance   Transfers: Sit to/from Stand Sit to Stand: Supervision         General transfer comment: pointedly cued pt to stop pushing with arms so hard when she does not need to.  Ambulation/Gait Ambulation/Gait assistance: Min guard;Supervision Ambulation Distance (Feet): 120 Feet (x2 with rest in between, then additional 69' with RW) Assistive device: Straight cane;Rolling walker (2 wheeled) Gait Pattern/deviations: Step-through pattern;Antalgic;Trendelenburg Gait velocity: slower Gait velocity interpretation: Below normal speed for age/gender General Gait Details: mildly unsteady and guarded overall with no AD or use of cane, but much more steady with the RW and pt stating more confident.   Stairs            Wheelchair Mobility    Modified Rankin (Stroke Patients Only)       Balance Overall balance assessment: Needs assistance Sitting-balance support: No upper extremity supported Sitting balance-Leahy Scale: Good     Standing balance support: No upper extremity supported Standing balance-Leahy Scale: Fair                      Cognition Arousal/Alertness: Awake/alert Behavior During Therapy: WFL for tasks assessed/performed Overall Cognitive Status: Within Functional Limits for tasks assessed                      Exercises      General Comments General comments (skin integrity, edema, etc.): After 120'  93/94% on 3L [place] with EHR 103.  Ambulating another 120'  sats at 94% and EHR 100 bpm.  Then amb 70' on RA with RW and sats dropped to 82% with 30 secs to return to 90% with efficient breathing.      Pertinent Vitals/Pain Pain Assessment: Faces Faces Pain Scale: Hurts a little bit Pain Location: vague, leg Pain Descriptors / Indicators: Grimacing    Home Living                      Prior Function            PT Goals (current goals can now be found in the care plan section) Acute Rehab PT Goals Patient Stated Goal: be independent PT Goal Formulation: With patient Time For Goal Achievement: 01/02/15 Potential to Achieve Goals: Good Progress towards PT goals: Progressing toward goals    Frequency  Min 3X/week    PT Plan Current plan remains appropriate    Co-evaluation             End of Session Equipment Utilized  During Treatment: Oxygen Activity Tolerance: Patient tolerated treatment well;Patient limited by fatigue Patient left: in chair;with call bell/phone within reach     Time: 1126-1149 PT Time Calculation (min) (ACUTE ONLY): 23 min  Charges:  $Gait Training: 8-22 mins $Therapeutic Activity: 8-22 mins                    G Codes:      [name], Tessie Fass 12/27/2014, 1:51 PM 12/27/2014  Donnella Sham, PT [phone]  (pager)

## 2014-12-27 NOTE — Progress Notes (Signed)
Advanced Heart Failure Rounding Note   Subjective:   [name] is a 69 year old with history of morbid obesity, DM, HTN, asthma, OSA, ICD, GERD, arthritis, CRT-D 2008 [employer], CAD, S/P CABG x5 12/07/2014 admitted with RLE cellulitis and volume overload.    Yesterday she was started on lasix drip. Brisk diuresis noted. Weight down 10 pounds.   Creatinine 1.3>1.4   Objective:   Weight Range:  Vital Signs:   Temp:  [98.2 F (36.8 C)-98.6 F (37 C)] 98.3 F (36.8 C) (11/16 0511) Pulse Rate:  [79-86] 86 (11/16 0511) Resp:  [16-20] 20 (11/16 0511) BP: (95-122)/(42-58) 95/42 mmHg (11/16 0511) SpO2:  [98 %-100 %] 98 % (11/16 0511) Weight:  [255 lb 1.6 oz (115.713 kg)] 255 lb 1.6 oz (115.713 kg) (11/16 0511) Last BM Date: 12/26/14  Weight change: Filed Weights   12/24/14 0609 12/26/14 0500 12/27/14 0511  Weight: 265 lb 4.8 oz (120.339 kg) 265 lb 10.5 oz (120.5 kg) 255 lb 1.6 oz (115.713 kg)    Intake/Output:   Intake/Output Summary (Last 24 hours) at 12/27/14 0811 Last data filed at 12/27/14 0513  Gross per 24 hour  Intake    967 ml  Output   7525 ml  Net  -6558 ml     Physical Exam: General:  Chronically ill  appearing. No resp difficulty. In chair.  HEENT: normal Neck: supple. JVP hard to assess but seems elevated. Carotids 2+ bilat; no bruits. No lymphadenopathy or thryomegaly appreciated. Cor: PMI nondisplaced. Regular rate & rhythm. No rubs, gallops or murmurs. Lungs: clear Abdomen: obese, soft, nontender, nondistended. No hepatosplenomegaly. No bruits or masses. Good bowel sounds. Extremities: no cyanosis, clubbing, rash,  R and LLE 2-3+ edema Neuro: alert & orientedx3, cranial nerves grossly intact. moves all 4 extremities w/o difficulty. Affect pleasant  Telemetry: AV paced 70s   Labs: Basic Metabolic Panel:  Recent Labs Lab 12/21/14 1900 12/24/14 0218 12/25/14 0459 12/26/14 0410 12/27/14 0425  NA 139 140 138 138 137  K 3.8 3.8 3.8  3.3* 3.0*  CL 96* 94* 91* 88* 81*  CO2 33* 39* 44* 41* 49*  GLUCOSE 157* 133* 97 116* 108*  BUN 14 17 17 16 13   CREATININE 1.03* 1.26* 1.35* 1.35* 1.49*  CALCIUM 8.6* 8.5* 8.5* 8.7* 9.2    Liver Function Tests: No results for input(s): AST, ALT, ALKPHOS, BILITOT, PROT, ALBUMIN in the last 168 hours. No results for input(s): LIPASE, AMYLASE in the last 168 hours. No results for input(s): AMMONIA in the last 168 hours.  CBC:  Recent Labs Lab 12/21/14 1900 12/26/14 0410  WBC 5.6 4.8  NEUTROABS  --  2.7  HGB 8.6* 8.0*  HCT 29.7* 29.0*  MCV 94.0 93.5  PLT 240 187    Cardiac Enzymes: No results for input(s): CKTOTAL, CKMB, CKMBINDEX, TROPONINI in the last 168 hours.  BNP: BNP (last 3 results)  Recent Labs  11/23/14 1744 12/26/14 0410  BNP 68.0 359.7*    ProBNP (last 3 results) No results for input(s): PROBNP in the last 8760 hours.    Other results:  Imaging:  No results found.   Medications:     Scheduled Medications: . aspirin EC  325 mg Oral Daily  . budesonide-formoterol  2 puff Inhalation BID  . carvedilol  3.125 mg Oral BID WC  . enoxaparin (LOVENOX) injection  0.5 mg/kg Subcutaneous Q24H  . fluticasone  1 spray Each Nare Daily  . insulin aspart  0-20 Units Subcutaneous TID WC  .  insulin aspart  4 Units Subcutaneous TID WC  . insulin detemir  15 Units Subcutaneous Daily  . loratadine  10 mg Oral Daily  . pantoprazole  80 mg Oral Daily  . piperacillin-tazobactam (ZOSYN)  IV  3.375 g Intravenous 3 times per day  . potassium chloride  40 mEq Oral TID  . pravastatin  80 mg Oral Daily  . spironolactone  25 mg Oral Daily     Infusions: . furosemide (LASIX) infusion 15 mg/hr (12/27/14 0345)     PRN Medications:  acetaminophen **OR** acetaminophen, albuterol, dicyclomine, docusate sodium, ondansetron **OR** ondansetron (ZOFRAN) IV, oxyCODONE, polyethylene glycol, sodium chloride   Assessment/Plan :   [name] is a 69 year old with  history of morbid obesity, DM, HTN, asthma, OSA, ICD, GERD, arthritis, CRT-D 2008 [employer], CAD, S/P CABG x5 12/07/2014 admitted with RLE cellulitis and volume overload  1. Acute on chronic diastolic HF  --EF [id number]. RV normal 2. CAD s/p CABG 11/2014  3. Ab pain  4. Morbid obesity 5. H/o heart block s/p BiV pacer 6. Cellulitis R foot 7. Hypokalemia 8 CRT-D [employer]  Brisk diuresis noted. Still with volume overload. Weight down 10 pounds. Creatinine up a little 1.3>1.4. CO2 49 Watch renal function closely. Cut back lasix drip to 5 mg per hour. Unable to add diamox due to sulfa allergy. Check BMET at 1300. If  CO2 higher will stop lasix. Continue spiro. Supplement potassium . Repeat BMET in am.   S/P CABG 11/2014.  No CP. On statin, aspirin, bb.   Consult dietitian.    Length of Stay: [place] NP-C  12/27/2014, 8:11 AM  Advanced Heart Failure Team Pager [phone] (M-F; 7a - 4p)  Please contact [facility] for night-coverage after hours (4p -7a ) and weekends on [email or web address]  Patient seen with NP, agree with the above note.  She is still volume overloaded markedly on exam.  HCO3 up with diuresis.  There is a question about our ability to use Diamox with her sulfa allergy.  Will review with pharmacy.  For now, will decreased Lasix gtt to 5 mg/hr.  She has OSA but does not use CPAP at home.  Will try her on it in the hospital at night.     [name] 12/27/2014 12:44 PM

## 2014-12-27 NOTE — Progress Notes (Signed)
[address]       [place] [address]             [phone]          Subjective: Feels a little better, not eating much , diarrhea is resolved but generalized abdominal discomfort  Objective: Vital signs in last 24 hours: Temp:  [98.2 F (36.8 C)-98.6 F (37 C)] 98.3 F (36.8 C) (11/16 0511) Pulse Rate:  [79-86] 86 (11/16 0511) Cardiac Rhythm:  [-] Ventricular paced;Bundle branch block (11/16 0700) Resp:  [16-20] 20 (11/16 0511) BP: (95-122)/(42-58) 95/42 mmHg (11/16 0511) SpO2:  [98 %-100 %] 98 % (11/16 0511) Weight:  [255 lb 1.6 oz (115.713 kg)] 255 lb 1.6 oz (115.713 kg) (11/16 0511)  Hemodynamic parameters for last 24 hours:    Intake/Output from previous day: 11/15 0701 - 11/16 0700 In: 967 [P.O.:530; I.V.:87; IV Piggyback:350] Out: [id number] [Urine:7525] Intake/Output this shift:    General appearance: alert, cooperative, fatigued and no distress Heart: regular rate and rhythm Lungs: mildly dim in left base Abdomen: benign Extremities: edema is not significantly changed  Wound: incis healing well erethema to RLE is mostly resolved to a brawney edema  Lab Results:  Recent Labs  12/26/14 0410  WBC 4.8  HGB 8.0*  HCT 29.0*  PLT 187   BMET:  Recent Labs  12/26/14 0410 12/27/14 0425  NA 138 137  K 3.3* 3.0*  CL 88* 81*  CO2 41* 49*  GLUCOSE 116* 108*  BUN 16 13  CREATININE 1.35* 1.49*  CALCIUM 8.7* 9.2    PT/INR: No results for input(s): LABPROT, INR in the last 72 hours. ABG    Component Value Date/Time   PHART 7.506* 12/03/2014 1136   HCO3 32.0* 12/03/2014 1136   TCO2 33 12/03/2014 1136   ACIDBASEDEF 4.0* 11/29/2014 1541   O2SAT 94.0 12/03/2014 1136   CBG (last 3)   Recent Labs  12/26/14 1713 12/26/14 2035 12/27/14 0619  GLUCAP 137* 115* 111*    Meds Scheduled Meds: . aspirin EC  325 mg Oral Daily  . budesonide-formoterol  2 puff Inhalation BID  . carvedilol  3.125 mg Oral BID WC  . enoxaparin (LOVENOX)  injection  0.5 mg/kg Subcutaneous Q24H  . fluticasone  1 spray Each Nare Daily  . insulin aspart  0-20 Units Subcutaneous TID WC  . insulin aspart  4 Units Subcutaneous TID WC  . insulin detemir  15 Units Subcutaneous Daily  . loratadine  10 mg Oral Daily  . pantoprazole  80 mg Oral Daily  . piperacillin-tazobactam (ZOSYN)  IV  3.375 g Intravenous 3 times per day  . potassium chloride  40 mEq Oral TID  . pravastatin  80 mg Oral Daily  . spironolactone  25 mg Oral Daily   Continuous Infusions: . furosemide (LASIX) infusion 15 mg/hr (12/27/14 0345)   PRN Meds:.acetaminophen **OR** acetaminophen, albuterol, dicyclomine, docusate sodium, ondansetron **OR** ondansetron (ZOFRAN) IV, oxyCODONE, polyethylene glycol, sodium chloride  Xrays No results found.  Assessment/Plan:  1 CHF team  is assisting with management , I believe some of the edema is at least in part related to nutritional status and third spacing with low protein levels. She is not eating well . The diuresis is leading to significant hypochloremic  metabolic alkalosis and creat is rising . K+  is being replaced. Consider backing away from diuretics. Check albumin levels 2 Could possibly convert to po abx soon   LOS: 6 days  GOLD,WAYNE E 12/27/2014

## 2014-12-27 NOTE — Evaluation (Signed)
Occupational Therapy Evaluation Patient Details Name: [name] MRN: [id number] DOB: 1945/06/03 Today's Date: 12/27/2014    History of Present Illness pt is a 69 y/o female with h/o recent CABG, DM, cardiomyopathy admitted from nursing facility for check and management of LE cellulitis.   Clinical Impression   Patient presenting with deconditioning secondary to above. Patient independent prior to original hospital admission. Patient currently functioning at a min to max assist level for ADLs and functional mobility. Patient will benefit from acute OT to increase overall independence in the areas of ADLs, functional mobility, and overall safety in order to safely discharge home with family assisting.     Follow Up Recommendations  Home health OT;Supervision/Assistance - 24 hour    Equipment Recommendations  3 in 1 bedside comode    Recommendations for Other Services  None at this time   Precautions / Restrictions Precautions Precautions: Fall;Sternal Precaution Comments: reinforced sternal precautions Restrictions Weight Bearing Restrictions: No    Mobility Bed Mobility Overal bed mobility: Needs Assistance Bed Mobility: Supine to Sit;Sit to Supine     Supine to sit: Supervision Sit to supine: Supervision   General bed mobility comments: Supervision for safety, no verbal or physical assist needed.  Transfers Overall transfer level: Needs assistance Equipment used: None Transfers: Sit to/from Stand Sit to Stand: Supervision         General transfer comment: Close supervision for sit to/from stand.    Balance Overall balance assessment: Needs assistance Sitting-balance support: No upper extremity supported;Feet supported Sitting balance-Leahy Scale: Good     Standing balance support: No upper extremity supported;During functional activity Standing balance-Leahy Scale: Fair    ADL Overall ADL's : Needs assistance/impaired     Grooming: Min  guard;Standing   Upper Body Bathing: Set up;Sitting   Lower Body Bathing: Moderate assistance;Sit to/from stand   Upper Body Dressing : Set up;Sitting   Lower Body Dressing: Maximal assistance;Sit to/from stand   Toilet Transfer: Min guard;BSC;Ambulation           Functional mobility during ADLs: Minimal assistance;Cueing for safety General ADL Comments: Pt requires cues for precautions. Pt ambulated without use of RW throughout room for grooming tasks at sink and ambulation to/from bathroom. Pt stated she was exhausted secondary to not getting any sleep last night.     Pertinent Vitals/Pain Pain Assessment: Faces Faces Pain Scale: Hurts a little bit Pain Location: RLE Pain Descriptors / Indicators: Grimacing Pain Intervention(s): Monitored during session     Hand Dominance Right   Extremity/Trunk Assessment Upper Extremity Assessment Upper Extremity Assessment: Overall WFL for tasks assessed   Lower Extremity Assessment Lower Extremity Assessment: Defer to PT evaluation       Communication Communication Communication: No difficulties   Cognition Arousal/Alertness: Awake/alert Behavior During Therapy: WFL for tasks assessed/performed Overall Cognitive Status: Within Functional Limits for tasks assessed              Home Living Family/patient expects to be discharged to:: Private residence Living Arrangements: Children;Other (Comment);Other relatives (2 sons, a daughter-in-law, and two grandchildren) Available Help at Discharge: Available PRN/intermittently;Family Type of Home: House Home Access: Stairs to enter [employer] of Steps: several Entrance Stairs-Rails: Right;Left Home Layout: Two level;Full bath on main level;Able to live on main level with bedroom/bathroom Alternate Level Stairs-Number of Steps: flight Alternate Level Stairs-Rails: Right;Left Bathroom Shower/Tub: Occupational psychologist: Standard     Home Equipment:  Environmental consultant - 2 wheels   Prior Functioning/Environment Level of Independence: Independent  OT Diagnosis: Generalized weakness;Acute pain   OT Problem List: Cardiopulmonary status limiting activity;Decreased knowledge of precautions;Decreased knowledge of use of DME or AE;Decreased strength;Decreased activity tolerance;Decreased range of motion;Obesity;Pain;Impaired balance (sitting and/or standing)   OT Treatment/Interventions: Self-care/ADL training;Energy conservation;DME and/or AE instruction;Therapeutic activities;Patient/family education;Balance training    OT Goals(Current goals can be found in the care plan section) Acute Rehab OT Goals Patient Stated Goal: be independent OT Goal Formulation: With patient Time For Goal Achievement: 01/10/15 Potential to Achieve Goals: Good  OT Frequency: Min 2X/week   Barriers to D/C:  None known at this time    End of Session Equipment Utilized During Treatment: Oxygen (2L/min)  Activity Tolerance: Patient tolerated treatment well Patient left: in bed;with call bell/phone within reach   Time: 1357-1419 OT Time Calculation (min): 22 min Charges:  OT General Charges $OT Visit: 1 Procedure OT Evaluation $Initial OT Evaluation Tier I: 1 Procedure  Tony Granquist , MS, OTR/L, CLT Pager: [phone]  12/27/2014, 3:02 PM

## 2014-12-28 DIAGNOSIS — N178 Other acute kidney failure: Secondary | ICD-10-CM

## 2014-12-28 LAB — GLUCOSE, CAPILLARY
Glucose-Capillary: 90 mg/dL (ref 65–99)
Glucose-Capillary: 126 mg/dL — ABNORMAL HIGH (ref 65–99)
Glucose-Capillary: 111 mg/dL — ABNORMAL HIGH (ref 65–99)
Glucose-Capillary: 137 mg/dL — ABNORMAL HIGH (ref 65–99)

## 2014-12-28 LAB — BASIC METABOLIC PANEL
Anion gap: 8 (ref 5–15)
BUN: 20 mg/dL (ref 6–20)
CO2: 50 mmol/L — ABNORMAL HIGH (ref 22–32)
Calcium: 9 mg/dL (ref 8.9–10.3)
Chloride: 79 mmol/L — ABNORMAL LOW (ref 101–111)
Creatinine, Ser: 1.77 mg/dL — ABNORMAL HIGH (ref 0.44–1.00)
GFR calc Af Amer: 32 mL/min — ABNORMAL LOW (ref >60)
GFR calc non Af Amer: 28 mL/min — ABNORMAL LOW (ref >60)
Glucose, Bld: 166 mg/dL — ABNORMAL HIGH (ref 65–99)
Potassium: 3.3 mmol/L — ABNORMAL LOW (ref 3.5–5.1)
Sodium: 137 mmol/L (ref 135–145)

## 2014-12-28 LAB — MAGNESIUM: Magnesium: 2 mg/dL (ref 1.7–2.4)

## 2014-12-28 LAB — VANCOMYCIN, RANDOM: Vancomycin Rm: 25 ug/mL

## 2014-12-28 MED ORDER — METHYLPREDNISOLONE SODIUM SUCC 125 MG IJ SOLR
80.0000 mg | Freq: Once | INTRAMUSCULAR | Status: AC
  Administered 2014-12-28: 80 mg via INTRAVENOUS
  Filled 2014-12-28: qty 2

## 2014-12-28 MED ORDER — POTASSIUM CHLORIDE ER 10 MEQ PO TBCR
40.0000 meq | EXTENDED_RELEASE_TABLET | Freq: Once | ORAL | Status: AC
  Administered 2014-12-28: 40 meq via ORAL
  Filled 2014-12-28 (×2): qty 4

## 2014-12-28 MED ORDER — POTASSIUM CHLORIDE CRYS ER 20 MEQ PO TBCR
40.0000 meq | EXTENDED_RELEASE_TABLET | Freq: Once | ORAL | Status: AC
  Administered 2014-12-28: 40 meq via ORAL
  Filled 2014-12-28: qty 2

## 2014-12-28 MED ORDER — DIPHENHYDRAMINE HCL 25 MG PO CAPS
25.0000 mg | ORAL_CAPSULE | Freq: Once | ORAL | Status: AC
  Administered 2014-12-28: 25 mg via ORAL
  Filled 2014-12-28: qty 1

## 2014-12-28 MED ORDER — ACETAZOLAMIDE ER 500 MG PO CP12
500.0000 mg | ORAL_CAPSULE | Freq: Two times a day (BID) | ORAL | Status: DC
  Administered 2014-12-28 – 2015-01-01 (×8): 500 mg via ORAL
  Filled 2014-12-28 (×9): qty 1

## 2014-12-28 MED ORDER — ALUM & MAG HYDROXIDE-SIMETH 200-200-20 MG/5ML PO SUSP
30.0000 mL | ORAL | Status: DC | PRN
  Administered 2014-12-28: 30 mL via ORAL
  Filled 2014-12-28: qty 30

## 2014-12-28 NOTE — Progress Notes (Signed)
ANTIBIOTIC CONSULT NOTE - FOLLOW UP  Pharmacy Consult for vancomycin and Zosyn Indication: RLE cellulitis  Allergies  Allergen Reactions  . Ivp Dye [Iodinated Diagnostic Agents] Shortness Of Breath    No reaction to PO contrast with non-ionic dye.06-25-2014/rsm  . Shellfish-Derived Products Anaphylaxis  . Sulfa Antibiotics Shortness Of Breath  . Iodine Hives  . Zithromax [Azithromycin] Rash    Patient Measurements: Height: 5\' 4"  (162.6 cm) Weight: 252 lb (114.306 kg) IBW/kg (Calculated) : 54.7  Vital Signs: Temp: 98.1 F (36.7 C) (11/17 0455) Temp Source: Oral (11/17 0455) BP: 112/50 mmHg (11/17 0455) Pulse Rate: 100 (11/17 0455) Intake/Output from previous day: 11/16 0701 - 11/17 0700 In: [id number] [P.O.:1200; I.V.:64; IV Piggyback:100] Out: 3200 [Urine:3200] Intake/Output from this shift: Total I/O In: 240 [P.O.:240] Out: 850 [Urine:850]  Labs:  Recent Labs  12/26/14 0410 12/27/14 0425 12/28/14 0900  WBC 4.8  --   --   HGB 8.0*  --   --   PLT 187  --   --   CREATININE 1.35* 1.49* 1.77*   Estimated Creatinine Clearance: 36.7 mL/min (by C-G formula based on Cr of 1.77).  Recent Labs  12/26/14 2000 12/27/14 0425 12/28/14 0535  VANCOTROUGH 64*  --   --   Jake Michaelis  --  36 25     Microbiology: Recent Results (from the past 720 hour(s))  Surgical pcr screen     Status: None   Collection Time: 11/29/14  5:39 AM  Result Value Ref Range Status   MRSA, PCR NEGATIVE NEGATIVE Final   Staphylococcus aureus NEGATIVE NEGATIVE Final    Comment:        The Xpert SA Assay (FDA approved for NASAL specimens in patients over 38 years of age), is one component of a comprehensive surveillance program.  Test performance has been validated by [facility] for patients greater than or equal to 68 year old. It is not intended to diagnose infection nor to guide or monitor treatment.   C difficile quick scan w PCR reflex     Status: None   Collection Time: 12/25/14   9:04 AM  Result Value Ref Range Status   C Diff antigen NEGATIVE NEGATIVE Final   C Diff toxin NEGATIVE NEGATIVE Final   C Diff interpretation Negative for toxigenic C. difficile  Final    Anti-infectives    Start     Dose/Rate Route Frequency Ordered Stop   12/21/14 2000  vancomycin (VANCOCIN) IVPB 1000 mg/200 mL premix  Status:  Discontinued     1,000 mg 200 mL/hr over 60 Minutes Intravenous Every 12 hours 12/21/14 1935 12/26/14 2128   12/21/14 1630  piperacillin-tazobactam (ZOSYN) IVPB 3.375 g     3.375 g 12.5 mL/hr over 240 Minutes Intravenous 3 times per day 12/21/14 1628        Assessment: 69 y/o female s/p CABG at the end of October admitted 12/21/2014 for redness and swelling of her leg. She was started on vancomycin at her facility prior to admission. Zosyn added upon admission to [facility].   Vanc random remains supratherapeutic at 25 mcg/ml this AM likely d/t accumulation over time given weight and extended duration of vancomycin therapy.  No cultures were drawn on admission prior to Zosyn initiation. Remains afebrile, WBC wnl. No s/sx of systemic infection throughout admission. Per 2014 IDSA SSTI guidelines, typical cellulitis should treat for streptococci and recommended duration of treatment is 5 days.    Given that pt has had 12 days of abx treatment and  continues to show no systemic signs of infection, recommend stopping antibiotics.  Vanc 11/6(PTA) >> Day #12  *11/10 VR = 13 *11/15 VT = 36 *11/16 VR = 36 *11/17 VR = 25  Based on recent levels, estimated kel of 0.015, half life of ~46h.  Zosyn 11/10 >> Day #8  11/14 CDiff neg  Goal of Therapy:  Vancomycin trough level 10-15 mcg/ml  Plan:  Continue to hold vancomycin  Random vanc level 11/19 AM Zosyn 3.375 g IV q8h to be infused over 4 hours Consider stopping antibiotics    Heloise Ochoa, Pharm.D. PGY2 Pharmacy Resident Pager: [phone]  12/28/2014 10:55 AM

## 2014-12-28 NOTE — Progress Notes (Signed)
Patient states she has gas/ indigestion - "I keep burping." Suzzanne Cloud, PA notified. Stated she would put in order to address discomfort.

## 2014-12-28 NOTE — Progress Notes (Signed)
Occupational Therapy Treatment Patient Details Name: [name] MRN: [id number] DOB: 02-Dec-1945 Today's Date: 12/28/2014    History of present illness pt is a 69 y/o female with h/o recent CABG, DM, cardiomyopathy admitted from nursing facility for check and management of LE cellulitis.   OT comments  Patient making progress towards OT goals, continue plan of care for now. Pt needs increased time for safety and due to shortness of breath. At end of session, on 2L/min supplemental 02, patient's sats =94%. Patient eager to go home with assistance from her children.    Follow Up Recommendations  Home health OT;Supervision/Assistance - 24 hour    Equipment Recommendations  3 in 1 bedside comode    Recommendations for Other Services  None at this time  Precautions / Restrictions Precautions Precautions: Fall;Sternal Precaution Comments: reinforced sternal precautions Restrictions Weight Bearing Restrictions: No    Mobility Bed Mobility General bed mobility comments: Pt found seated in recliner and left seated in recliner at end of session  Transfers Overall transfer level: Needs assistance Equipment used: Rolling walker (2 wheeled) Transfers: Sit to/from Stand Sit to Stand: Min assist Stand pivot transfers: Supervision       General transfer comment: Close supervision for sit to/from stand. Cues for hand placement secondary to sternal precautions.     Balance Overall balance assessment: Needs assistance Sitting-balance support: No upper extremity supported;Feet supported Sitting balance-Leahy Scale: Good     Standing balance support: Bilateral upper extremity supported Standing balance-Leahy Scale: Fair   ADL Overall ADL's : Needs assistance/impaired Eating/Feeding: Set up;Sitting   Grooming: Supervision/safety;Standing   Upper Body Bathing: Set up;Sitting   Lower Body Bathing: Moderate assistance;Sit to/from stand   Upper Body Dressing : Set up;Sitting    Lower Body Dressing: Maximal assistance;Sit to/from stand   Toilet Transfer: Min guard;BSC;Ambulation   Toileting- Water quality scientist and Hygiene: Min guard;Sit to/from Nurse, children's Details (indicate cue type and reason): discussed walk-in shower transfer, stepping over threshold backwards using RW Functional mobility during ADLs: Supervision/safety;Min guard;Cueing for safety;Rolling walker General ADL Comments: Pt found seated in recliner. Pt continues to require assistance with ADLs and states her son/s will be available to assist prn. Pt ambulated from recliner to [place] for toilet transfer. Therapist reiterated importance of sternal precautions and patient able to verbalize understanding of these. Pt placed hands in lap for sit to/from stand transfers.      Cognition   Behavior During Therapy: WFL for tasks assessed/performed Overall Cognitive Status: Within Functional Limits for tasks assessed                 Pertinent Vitals/ Pain       Pain Assessment: No/denies pain (pt with some complaints of discomfort at catheter site)   Frequency Min 2X/week     Progress Toward Goals  OT Goals(current goals can now befound in the care plan section)  Progress towards OT goals: Progressing toward goals     Plan Discharge plan remains appropriate    End of Session Equipment Utilized During Treatment: Oxygen (2L/min)   Activity Tolerance Patient tolerated treatment well   Patient Left in chair;with call bell/phone within reach    Time: 1410-1435 OT Time Calculation (min): 25 min  Charges: OT General Charges $OT Visit: 1 Procedure OT Treatments $Self Care/Home Management : 23-37 mins  Marialuiza Car , MS, OTR/L, CLT Pager: [phone]  12/28/2014, 2:44 PM

## 2014-12-28 NOTE — Care Management Note (Addendum)
Case Management Note  Patient Details  Name: [name] MRN: [id number] Date of Birth: [birth year]  Subjective/Objective: Pt admitted with cellultius                   Action/Plan:  Pt recent s/p CABG and discharged to SNF due to not having 24 hour supervision.  Pt now has son [name] available to provide 24 hour supervision; both pt and son are requesting to not return to [place], plan is for pt to return home with home health.  CM requested PT evaluation and will continue to monitor for disposition needs.    Expected Discharge Date:                  Expected Discharge Plan:  [place]  In-House Referral:  Clinical Social Work  Discharge planning Services  CM Consult  Post Acute Care Choice:    Choice offered to:     DME Arranged:   3:1, RW DME Agency:   [facility]  HH Arranged:   PT, OT HH Agency:   [facility]  Status of Service:  In process, will continue to follow  Medicare Important Message Given:  Yes Date Medicare IM Given:    Medicare IM give by:    Date Additional Medicare IM Given:    Additional Medicare Important Message give by:     If discussed at [place] of Stay Meetings, dates discussed:    Additional Comments: 12/28/2014  In house therapy has placed recommendation regarding HH/DME; offered pt choice, pt chose [facility], agency contacted and referral accepted.  CM asked for HH/DME orders and clarity regarding possible home O2 needs during progression rounds.  CM verified with pt that she was not on home oxygen, OT at bedside and will perform ambulation test required for home oxygen.  CM will continue to montior  12/27/14 Pt has been placed on Lasix drip per HF team consult.  CM will continue to monitor for disposition needs  11/151/6 PT evaluated pt 12/26/14; requested to evaluate pt again before confirming recommendations Maryclare Labrador, RN 12/28/2014, 8:45 AM

## 2014-12-28 NOTE — Progress Notes (Signed)
Pt. Refused cpap. Pt. States she may try it some other time but not tonight.

## 2014-12-28 NOTE — Progress Notes (Signed)
CARDIAC REHAB PHASE I   PRE:  Rate/Rhythm: 88 pacing    BP: sitting 105/54    SaO2: 97 2L  MODE:  Ambulation: 250 ft   POST:  Rate/Rhythm: 112 pacing    BP: sitting 120/62     SaO2: 95 2L  Pt tolerated fairly well using RW, 2L, assist x1 (mostly for equipment). Responded well to encouragement to pace herself and increase distance. Pt admits that she is fearful of falling. Rest x3 for SOB and fatigue. Return to recliner, sts "I'm not that tired". Pt will try RA tomorrow.  Inspiring 500 ml max on IS. 1000-1056  Josephina Shih [place] CES, ACSM 12/28/2014 10:53 AM

## 2014-12-28 NOTE — Progress Notes (Signed)
Call from [place] r/t sudden increase in HR - confirmed up with PT walking in the hallway w/o problems or complaints.

## 2014-12-28 NOTE — Progress Notes (Signed)
Patient reports difficulty breathing. O2 SATs = 98%, lips pink, without shortness of breath, respirations 16/ min, O2 via [place] @ 2L/ min. Provided reassurance. Stated her brother brought her a fish sandwich for supper, "I bet my fish sandwich might have been cooked in oil that was used to cook shrimp." Without rash or skin alteration, lips and tongue w/o swelling/ WNL. States she takes Benadryl 50 mg PO at home when situation has occurred previously.

## 2014-12-28 NOTE — Progress Notes (Signed)
Dr. Roxan Hockey paged.

## 2014-12-28 NOTE — Progress Notes (Addendum)
      [address]       [place] [address]             [phone]          Subjective: Feels much better today  Objective: Vital signs in last 24 hours: Temp:  [98 F (36.7 C)-98.1 F (36.7 C)] 98.1 F (36.7 C) (11/17 0455) Pulse Rate:  [83-100] 100 (11/17 0455) Cardiac Rhythm:  [-] Ventricular paced;Bundle branch block (11/17 0700) Resp:  [17-18] 18 (11/17 0455) BP: (94-116)/(50-61) 112/50 mmHg (11/17 0455) SpO2:  [92 %-97 %] 96 % (11/17 0745) Weight:  [252 lb (114.306 kg)] 252 lb (114.306 kg) (11/17 0455)  Hemodynamic parameters for last 24 hours:    Intake/Output from previous day: 11/16 0701 - 11/17 0700 In: [id number] [P.O.:1200; I.V.:64; IV Piggyback:100] Out: 3200 [Urine:3200] Intake/Output this shift: Total I/O In: 240 [P.O.:240] Out: 850 [Urine:850]  General appearance: alert, cooperative and no distress Heart: regular rate and rhythm Lungs: clear to auscultation bilaterally Abdomen: benign Extremities: edema conts to improve Wound: incis stable, RLE looks a little more erethematous  Lab Results:  Recent Labs  12/26/14 0410  WBC 4.8  HGB 8.0*  HCT 29.0*  PLT 187   BMET:   Recent Labs  12/27/14 0425 12/28/14 0900  NA 137 137  K 3.0* 3.3*  CL 81* 79*  CO2 49* 50*  GLUCOSE 108* 166*  BUN 13 20  CREATININE 1.49* 1.77*  CALCIUM 9.2 9.0    PT/INR: No results for input(s): LABPROT, INR in the last 72 hours. ABG    Component Value Date/Time   PHART 7.506* 12/03/2014 1136   HCO3 32.0* 12/03/2014 1136   TCO2 33 12/03/2014 1136   ACIDBASEDEF 4.0* 11/29/2014 1541   O2SAT 94.0 12/03/2014 1136   CBG (last 3)   Recent Labs  12/27/14 1154 12/27/14 1634 12/27/14 2059  GLUCAP 95 157* 143*    Meds Scheduled Meds: . aspirin EC  325 mg Oral Daily  . budesonide-formoterol  2 puff Inhalation BID  . carvedilol  3.125 mg Oral BID WC  . enoxaparin (LOVENOX) injection  0.5 mg/kg Subcutaneous Q24H  . fluticasone  1 spray Each  Nare Daily  . hydrocortisone   Rectal BID  . insulin aspart  0-20 Units Subcutaneous TID WC  . insulin aspart  4 Units Subcutaneous TID WC  . insulin detemir  15 Units Subcutaneous Daily  . loratadine  10 mg Oral Daily  . pantoprazole  80 mg Oral Daily  . piperacillin-tazobactam (ZOSYN)  IV  3.375 g Intravenous 3 times per day  . potassium chloride  40 mEq Oral TID  . pravastatin  80 mg Oral Daily  . spironolactone  25 mg Oral Daily   Continuous Infusions: . furosemide (LASIX) infusion 6 mg/hr (12/27/14 1002)   PRN Meds:.acetaminophen **OR** acetaminophen, albuterol, dicyclomine, docusate sodium, ondansetron **OR** ondansetron (ZOFRAN) IV, oxyCODONE, polyethylene glycol, sodium chloride, zolpidem  Xrays No results found.  Assessment/Plan:  1 diuresis has been excellent, diuretics decreased yesterday,  BMET pending 2 poss change to po abx  3 sugars controlled   LOS: 7 days   Addendum: Hypochloremic alkalosis conts to worse and Creat rising- will d/c lasix as per CHF recs. Replace K+ [name] E 12/28/2014

## 2014-12-28 NOTE — Progress Notes (Signed)
Advanced Heart Failure Rounding Note   Subjective:   Ms Runkel is a 69 year old with history of morbid obesity, DM, HTN, asthma, OSA, ICD, GERD, arthritis, CRT-D 2008 [employer], CAD, S/P CABG x5 12/07/2014 admitted with RLE cellulitis and volume overload.    Yesterday lasix drip was cut back to 5 mg per hour.  Negative 1.8 . Weight down  Another 3 pounds.    Creatinine 1.3>1.4 > pending.   Objective:   Weight Range:  Vital Signs:   Temp:  [98 F (36.7 C)-98.1 F (36.7 C)] 98.1 F (36.7 C) (11/17 0455) Pulse Rate:  [83-100] 100 (11/17 0455) Resp:  [17-18] 18 (11/17 0455) BP: (94-116)/(50-61) 112/50 mmHg (11/17 0455) SpO2:  [92 %-97 %] 96 % (11/17 0745) Weight:  [252 lb (114.306 kg)] 252 lb (114.306 kg) (11/17 0455) Last BM Date: 12/27/14  Weight change: Filed Weights   12/26/14 0500 12/27/14 0511 12/28/14 0455  Weight: 265 lb 10.5 oz (120.5 kg) 255 lb 1.6 oz (115.713 kg) 252 lb (114.306 kg)    Intake/Output:   Intake/Output Summary (Last 24 hours) at 12/28/14 0827 Last data filed at 12/28/14 0456  Gross per 24 hour  Intake   1124 ml  Output   3200 ml  Net  -2076 ml     Physical Exam: General:  Chronically ill  appearing. No resp difficulty. In chair.  HEENT: normal Neck: supple. JVP hard to assess but seems elevated. Carotids 2+ bilat; no bruits. No lymphadenopathy or thryomegaly appreciated. Cor: PMI nondisplaced. Regular rate & rhythm. No rubs, gallops or murmurs. Lungs: clear Abdomen: obese, soft, nontender, nondistended. No hepatosplenomegaly. No bruits or masses. Good bowel sounds. Extremities: no cyanosis, clubbing, rash,  R and LLE 2-3+ edema Neuro: alert & orientedx3, cranial nerves grossly intact. moves all 4 extremities w/o difficulty. Affect pleasant  Telemetry: AV paced 70s   Labs: Basic Metabolic Panel:  Recent Labs Lab 12/21/14 1900 12/24/14 0218 12/25/14 0459 12/26/14 0410 12/27/14 0425  NA 139 140 138 138 137  K 3.8 3.8  3.8 3.3* 3.0*  CL 96* 94* 91* 88* 81*  CO2 33* 39* 44* 41* 49*  GLUCOSE 157* 133* 97 116* 108*  BUN 14 17 17 16 13   CREATININE 1.03* 1.26* 1.35* 1.35* 1.49*  CALCIUM 8.6* 8.5* 8.5* 8.7* 9.2    Liver Function Tests:  Recent Labs Lab 12/27/14 1009  ALBUMIN 2.8*   No results for input(s): LIPASE, AMYLASE in the last 168 hours. No results for input(s): AMMONIA in the last 168 hours.  CBC:  Recent Labs Lab 12/21/14 1900 12/26/14 0410  WBC 5.6 4.8  NEUTROABS  --  2.7  HGB 8.6* 8.0*  HCT 29.7* 29.0*  MCV 94.0 93.5  PLT 240 187    Cardiac Enzymes: No results for input(s): CKTOTAL, CKMB, CKMBINDEX, TROPONINI in the last 168 hours.  BNP: BNP (last 3 results)  Recent Labs  11/23/14 1744 12/26/14 0410  BNP 68.0 359.7*    ProBNP (last 3 results) No results for input(s): PROBNP in the last 8760 hours.    Other results:  Imaging: No results found.   Medications:     Scheduled Medications: . aspirin EC  325 mg Oral Daily  . budesonide-formoterol  2 puff Inhalation BID  . carvedilol  3.125 mg Oral BID WC  . enoxaparin (LOVENOX) injection  0.5 mg/kg Subcutaneous Q24H  . fluticasone  1 spray Each Nare Daily  . hydrocortisone   Rectal BID  . insulin aspart  0-20 Units Subcutaneous TID WC  . insulin aspart  4 Units Subcutaneous TID WC  . insulin detemir  15 Units Subcutaneous Daily  . loratadine  10 mg Oral Daily  . pantoprazole  80 mg Oral Daily  . piperacillin-tazobactam (ZOSYN)  IV  3.375 g Intravenous 3 times per day  . potassium chloride  40 mEq Oral TID  . pravastatin  80 mg Oral Daily  . spironolactone  25 mg Oral Daily    Infusions: . furosemide (LASIX) infusion 6 mg/hr (12/27/14 1002)    PRN Medications: acetaminophen **OR** acetaminophen, albuterol, dicyclomine, docusate sodium, ondansetron **OR** ondansetron (ZOFRAN) IV, oxyCODONE, polyethylene glycol, sodium chloride, zolpidem   Assessment/Plan :   Ms Saesee is a 69 year old with history  of morbid obesity, DM, HTN, asthma, OSA, ICD, GERD, arthritis, CRT-D 2008 [employer], CAD, S/P CABG x5 12/07/2014 admitted with RLE cellulitis and volume overload  1. Acute on chronic diastolic HF  --EF [id number]. RV normal 2. CAD s/p CABG 11/2014  3. Ab pain  4. Morbid obesity 5. H/o heart block s/p BiV pacer 6. Cellulitis R foot 7. Hypokalemia 8 CRT-D [employer]  Brisk diuresis noted.  Weight down another 3 pounds. BMET pending. If  CO2 higher will stop lasix. Continue spiro. Supplement potassium .   S/P CABG 11/2014.  No CP. On statin, aspirin, bb.   Refuses CPAP.     Length of Stay: [place] NP-C  12/28/2014, 8:27 AM  Advanced Heart Failure Team Pager [phone] (M-F; 7a - 4p)  Please contact [facility] for night-coverage after hours (4p -7a ) and weekends on [email or web address]   Patient seen and examined with Oda Kilts, PA-C. We discussed all aspects of the encounter. I agree with the assessment and plan as stated above.   Continues to diurese. Still has volume on board but renal function and CO2 continue to climb. Will stop IV lasix. Switch to po torsemide in am. Will give acetazolamide. Watch closely given sulfa allergy. Cellulitis improving. Discussed need to limit fluid intake.   Kyona Chauncey,MD 5:19 PM

## 2014-12-29 DIAGNOSIS — N179 Acute kidney failure, unspecified: Secondary | ICD-10-CM

## 2014-12-29 DIAGNOSIS — N189 Chronic kidney disease, unspecified: Secondary | ICD-10-CM

## 2014-12-29 DIAGNOSIS — L03116 Cellulitis of left lower limb: Secondary | ICD-10-CM

## 2014-12-29 LAB — GLUCOSE, CAPILLARY
Glucose-Capillary: 154 mg/dL — ABNORMAL HIGH (ref 65–99)
Glucose-Capillary: 142 mg/dL — ABNORMAL HIGH (ref 65–99)
Glucose-Capillary: 132 mg/dL — ABNORMAL HIGH (ref 65–99)
Glucose-Capillary: 94 mg/dL (ref 65–99)

## 2014-12-29 LAB — BASIC METABOLIC PANEL
Anion gap: 8 (ref 5–15)
BUN: 24 mg/dL — ABNORMAL HIGH (ref 6–20)
CO2: 47 mmol/L — ABNORMAL HIGH (ref 22–32)
Calcium: 9.1 mg/dL (ref 8.9–10.3)
Chloride: 81 mmol/L — ABNORMAL LOW (ref 101–111)
Creatinine, Ser: 2.06 mg/dL — ABNORMAL HIGH (ref 0.44–1.00)
GFR calc Af Amer: 27 mL/min — ABNORMAL LOW (ref >60)
GFR calc non Af Amer: 23 mL/min — ABNORMAL LOW (ref >60)
Glucose, Bld: 163 mg/dL — ABNORMAL HIGH (ref 65–99)
Potassium: 3.9 mmol/L (ref 3.5–5.1)
Sodium: 136 mmol/L (ref 135–145)

## 2014-12-29 MED ORDER — AMOXICILLIN-POT CLAVULANATE 500-125 MG PO TABS
1.0000 | ORAL_TABLET | Freq: Two times a day (BID) | ORAL | Status: DC
  Administered 2014-12-29 – 2015-01-01 (×7): 500 mg via ORAL
  Filled 2014-12-29 (×8): qty 1

## 2014-12-29 NOTE — Care Management Note (Addendum)
Case Management Note  Patient Details  Name: [name] MRN: [id number] Date of Birth: 1946-02-04  Subjective/Objective: Pt admitted with cellultius                   Action/Plan:  Pt recent s/p CABG and discharged to SNF due to not having 24 hour supervision.  Pt now has son [name] available to provide 24 hour supervision; both pt and son are requesting to not return to [facility], plan is for pt to return home with home health.  CM requested PT evaluation and will continue to monitor for disposition needs.    Expected Discharge Date:                  Expected Discharge Plan:  [place]  In-House Referral:  Clinical Social Work  Discharge planning Services  CM Consult  Post Acute Care Choice:    Choice offered to:     DME Arranged:   3:1, RW DME Agency:   [facility]  HH Arranged:   PT, OT HH Agency:   [facility]  Status of Service:  In process, will continue to follow  Medicare Important Message Given:  Yes Date Medicare IM Given:    Medicare IM give by:    Date Additional Medicare IM Given:    Additional Medicare Important Message give by:     If discussed at [place] of Stay Meetings, dates discussed:    Additional Comments: 12/29/2014  CM asked [facility] to review chart to determine if home O2 would be approved; per agency pt will qualify for home O2 if needed.  CM informed bedside nurse and will place a physician sticky note.  CM also requested HH/DME orders per recommendations.  12/28/14 In house therapy has placed recommendation regarding HH/DME; offered pt choice, pt chose [facility], agency contacted and referral accepted.  CM asked for HH/DME orders and clarity regarding possible home O2 needs during progression rounds.  CM verified with pt that she was not on home oxygen, OT at bedside and will perform ambulation test required for home oxygen.  CM will continue to montior  12/27/14 Pt has been placed on Lasix drip per HF team consult.  CM will continue to monitor  for disposition needs  11/151/6 PT evaluated pt 12/26/14; requested to evaluate pt again before confirming recommendations Maryclare Labrador, RN 12/29/2014, 2:31 PM

## 2014-12-29 NOTE — Progress Notes (Signed)
Physical Therapy Treatment Patient Details Name: [name] MRN: [id number] DOB: 1945/11/05 Today's Date: 12/29/2014    History of Present Illness pt is a 69 y/o female with h/o recent CABG, DM, cardiomyopathy admitted from nursing facility for check and management of LE cellulitis.    PT Comments    Pt progressing well with mobility. She walked 250' with RW without LOB, SaO2 98% on 2L O2 walking, HR 113.   Follow Up Recommendations  Home health PT;Supervision/Assistance - 24 hour     Equipment Recommendations  Rolling walker with 5" wheels;Other (comment) (and portable O2)    Recommendations for Other Services       Precautions / Restrictions Precautions Precautions: Fall;Sternal Precaution Comments: reinforced sternal precautions Restrictions Weight Bearing Restrictions: No    Mobility  Bed Mobility               General bed mobility comments: pt up in recliner  Transfers Overall transfer level: Modified independent Equipment used: Rolling walker (2 wheeled) Transfers: Sit to/from Stand Sit to Stand: Modified independent (Device/Increase time)         General transfer comment: used armrests to push up  Ambulation/Gait Ambulation/Gait assistance: Modified independent (Device/Increase time) Ambulation Distance (Feet): 200 Feet Assistive device: Rolling walker (2 wheeled) Gait Pattern/deviations: WFL(Within Functional Limits) Gait velocity: WFL   General Gait Details: steady with RW, SaO2 98% on 2L O2 walking, HR 113 walking, 4 brief standing rest breaks due to BLE fatigue, verbal cues for inhaling through nose   Stairs            Wheelchair Mobility    Modified Rankin (Stroke Patients Only)       Balance     Sitting balance-Leahy Scale: Good       Standing balance-Leahy Scale: Fair                      Cognition Arousal/Alertness: Awake/alert Behavior During Therapy: WFL for tasks assessed/performed Overall  Cognitive Status: Within Functional Limits for tasks assessed                      Exercises      General Comments        Pertinent Vitals/Pain Pain Assessment: No/denies pain    Home Living                      Prior Function            PT Goals (current goals can now be found in the care plan section) Acute Rehab PT Goals Patient Stated Goal: be independent PT Goal Formulation: With patient Time For Goal Achievement: 01/02/15 Potential to Achieve Goals: Good Progress towards PT goals: Progressing toward goals    Frequency  Min 3X/week    PT Plan Current plan remains appropriate    Co-evaluation             End of Session Equipment Utilized During Treatment: Oxygen Activity Tolerance: Patient tolerated treatment well;Patient limited by fatigue Patient left: in chair;with call bell/phone within reach;with family/visitor present     Time: [id number] PT Time Calculation (min) (ACUTE ONLY): 22 min  Charges:  $Gait Training: 8-22 mins                    G Codes:      Philomena Doheny 12/29/2014, 1:46 PM [phone]

## 2014-12-29 NOTE — Discharge Summary (Signed)
Physician Discharge Summary  Patient ID: [name] MRN: [id number] DOB/AGE: 1945-11-16 69 y.o.  Admit date: 12/21/2014 Discharge date: 12/29/2014  Admission Diagnoses:   Cellulitis  Acute on chronic diastolic heart failure  Discharge Diagnoses:  Active Problems:   Cellulitis  Patient Active Problem List   Diagnosis Date Noted  . Cellulitis 12/21/2014  . S/P CABG x 5 11/29/2014  . CAD, multiple vessel   . Acute diastolic HF (heart failure) ([place]) 11/23/2014  . Diarrhea 07/12/2014  . Generalized abdominal pain 07/12/2014  . Diastolic CHF, acute on chronic (HCC) 11/04/2013  . Mixed hypercholesterolemia and hypertriglyceridemia 04/22/2013  . Vertigo 04/22/2013  . Dyspnea 08/25/2012  . Allergic to IV contrast 07/23/2012  . Unstable angina ([place]) 07/22/2012  . Morbid obesity ([place]) 11/22/2011  . Cardiomyopathy, nonischemic, Bi V responder,  EF 65-70% echo Oct 2015 11/22/2011  . GERD (gastroesophageal reflux disease) 11/22/2011  . Back pain 11/22/2011  . OSA (obstructive sleep apnea) 11/22/2011  . HEMORRHOIDS-EXTERNAL 02/21/2010  . NAUSEA 02/21/2010  . ABDOMINAL PAIN -GENERALIZED 02/21/2010  . PERSONAL HX COLONIC POLYPS 02/21/2010  . ANEMIA 04/16/2007  . Essential hypertension 04/16/2007  . Coronary atherosclerosis 04/16/2007  . DIVERTICULOSIS, COLON 04/16/2007  . ARTHRITIS 04/16/2007  . INTERNAL HEMORRHOIDS WITHOUT MENTION COMP 04/12/2007  . Asthma 04/12/2007  . Cardiac resynchronization therapy pacemaker (CRT-P) in place 04/12/2007  . ACUTE GASTRITIS WITHOUT MENTION OF HEMORRHAGE 12/14/2006  . COLONIC POLYPS, HYPERPLASTIC 09/27/2002  . FATTY LIVER DISEASE 02/16/2002    HPI:at time of admission  Patient returns for routine postoperative follow-up having undergone CABG x 5 on 12/07/2014. She has multiple medical problems including morbid obesity, DM, Cardiomyopathy with PPM in place. She presents today by the request of her nursing facility for a wound check. She  states when she left the hospital her toes were blue. She states her sister was putting lotion on her leg and noticed it was very red and there were blisters along her toes. The nurse practitioner at the center started her on Vancomycin and they wished for [place] to see if the leg was improving. The patient states the leg remains red. It is tender to touch and there are blisters down her lower extremity. She denies fever.She was re-admitted for IV abx , specifically Vancomycin and Zosyn.   Discharged Condition: good  Hospital Course: The patient was admitted and placed on IV antibiotics, zosyn and vancomycin which has lead to significant overall improvement in the cellulitis. Also she has been diuresed aggressively. We're assisted by the heart failure team. She did develop some worsening renal insufficiency which has improved over time. She developed a metabolic alkalosis which is also improved with Diamox. She is gradually increasing activities using cardiac rehabilitation modalities. Her appetite is slowly improving but is impaired. Her generalized strength is improving with time that she remains fairly weak. She is currently on Augmentin which will continue at time of discharge for the cellulitis which has improved. Her overall status is felt to be stable for discharge on today's date.  Consults: heart failure team  Significant Diagnostic Studies: venous duplex  Treatments: IV abx and diuretics  Discharge Exam: Blood pressure 118/56, pulse 78, temperature 98.1 F (36.7 C), temperature source Oral, resp. rate 18, height 5\' 4"  (1.626 m), weight 251 lb 1.7 oz (113.9 kg), SpO2 97 %.   General appearance: alert, cooperative and no distress Heart: regular rate and rhythm Lungs: clear to auscultation bilaterally Abdomen: benign Extremities: edema conts to improve Wound: RLE stable appearance or  brawney edema  Disposition: 03-Skilled Nursing Facility  medications at time of discharge:    Medication List    STOP taking these medications        diclofenac 50 MG tablet  Commonly known as:  CATAFLAM     docusate sodium 100 MG capsule  Commonly known as:  COLACE     furosemide 40 MG tablet  Commonly known as:  LASIX     isosorbide mononitrate 30 MG 24 hr tablet  Commonly known as:  IMDUR     loperamide 2 MG tablet  Commonly known as:  IMODIUM A-D     ondansetron 8 MG tablet  Commonly known as:  ZOFRAN     vancomycin 1,000 mg in sodium chloride 0.9 % 250 mL      TAKE these medications        albuterol 108 (90 BASE) MCG/ACT inhaler  Commonly known as:  PROVENTIL HFA;VENTOLIN HFA  Inhale 2 puffs into the lungs every 4 (four) hours as needed for wheezing (wheezing).     amoxicillin-clavulanate 500-125 MG tablet  Commonly known as:  AUGMENTIN  Take 1 tablet (500 mg total) by mouth every 12 (twelve) hours.     aspirin 325 MG EC tablet  Take 1 tablet (325 mg total) by mouth daily.     carvedilol 6.25 MG tablet  Commonly known as:  COREG  Take 1 tablet (6.25 mg total) by mouth 2 (two) times daily with a meal.     cetirizine 10 MG tablet  Commonly known as:  ZYRTEC  Take 10 mg by mouth daily.     clotrimazole 10 MG troche  Commonly known as:  MYCELEX  Take 10 mg by mouth every 4 (four) hours as needed (mouth pain).     dicyclomine 20 MG tablet  Commonly known as:  BENTYL  Take 1 tablet (20 mg total) by mouth 2 (two) times daily as needed (pain).     fluticasone 50 MCG/ACT nasal spray  Commonly known as:  FLONASE  Place 1 spray into the nose 2 (two) times daily as needed for rhinitis or allergies.     insulin aspart 100 UNIT/ML injection  Commonly known as:  novoLOG  Inject 4 Units into the skin 3 (three) times daily with meals.     insulin detemir 100 UNIT/ML injection  Commonly known as:  LEVEMIR  Inject 0.15 mLs (15 Units total) into the skin daily.     omeprazole 40 MG capsule  Commonly known as:  PRILOSEC  Take 40 mg by mouth daily.      oxyCODONE 5 MG immediate release tablet  Commonly known as:  Oxy IR/ROXICODONE  Take 1-2 tablets (5-10 mg total) by mouth every 6 (six) hours as needed for moderate pain.     potassium chloride SA 20 MEQ tablet  Commonly known as:  K-DUR,KLOR-CON  Take 20 mEq by mouth daily.     pravastatin 80 MG tablet  Commonly known as:  PRAVACHOL  Take 1 tablet (80 mg total) by mouth every evening.     spironolactone 25 MG tablet  Commonly known as:  ALDACTONE  Take 25 mg by mouth daily.     SYMBICORT 160-4.5 MCG/ACT inhaler  Generic drug:  budesonide-formoterol  Inhale 2 puffs into the lungs 2 (two) times daily.     torsemide 20 MG tablet  Commonly known as:  DEMADEX  Take 2 tablets (40 mg total) by mouth daily.     triamcinolone cream 0.1 %  Commonly known as:  KENALOG  Apply 1 application topically daily.       Follow-up Information    Follow up with Melrose Nakayama, MD.   Specialty:  Cardiothoracic Surgery   Why:  01/09/15 at 12 noon to see the surgeon Dr Roxan Hockey. Please obtain a chest xray at [facility] at 11:30 am which is located in the same office complex.   Contact information:   [address]  [place] [id number] [phone]       Follow up with Glori Bickers, MD On 01/10/2015.   Specialty:  Cardiology   Why:  at 300 for post hospital follow up. Please bring your medications and home weights to your appointment. Code for pt parking is [id number].   Contact information:   [address] [place] [address] [phone]       Signed: John Giovanni 12/29/2014, 9:39 AM

## 2014-12-29 NOTE — Progress Notes (Signed)
Patient is refusing to wear CPAP at this time. Informed patient if she changes her mind have RN contact RT.

## 2014-12-29 NOTE — Progress Notes (Addendum)
Advanced Heart Failure Rounding Note   Subjective:    [name] is a 69 year old with history of morbid obesity, DM, HTN, asthma, OSA, ICD, GERD, arthritis, CRT-D 2008 [employer], CAD, S/P CABG x5 12/07/2014 admitted with RLE cellulitis and volume overload.   Feeling much better. Weight down 17 pounds. No dyspnea. Has eaten a whole box of [employer] in 2 days. Lasix gtt stopped yesterday. Remains on diamox  Creatinine worse.    Objective:   Weight Range:  Vital Signs:   Temp:  [98 F (36.7 C)-98.7 F (37.1 C)] 98 F (36.7 C) (11/18 1400) Pulse Rate:  [78-113] 99 (11/18 1400) Resp:  [17-18] 17 (11/18 1400) BP: (109-118)/(43-56) 109/55 mmHg (11/18 1400) SpO2:  [93 %-99 %] 97 % (11/18 1400) Weight:  [113.9 kg (251 lb 1.7 oz)] 113.9 kg (251 lb 1.7 oz) (11/18 0439) Last BM Date: 12/29/14  Weight change: Filed Weights   12/27/14 0511 12/28/14 0455 12/29/14 0439  Weight: 115.713 kg (255 lb 1.6 oz) 114.306 kg (252 lb) 113.9 kg (251 lb 1.7 oz)    Intake/Output:   Intake/Output Summary (Last 24 hours) at 12/29/14 1451 Last data filed at 12/29/14 1300  Gross per 24 hour  Intake    890 ml  Output   1300 ml  Net   -410 ml     Physical Exam: General:  Chronically ill  appearing. No resp difficulty. In chair.  HEENT: normal Neck: supple. JVP hard to assess but seems around 7 Carotids 2+ bilat; no bruits. No lymphadenopathy or thryomegaly appreciated. Cor: PMI nondisplaced. Regular rate & rhythm. No rubs, gallops or murmurs. Lungs: clear Abdomen: obese, soft, nontender, nondistended. No hepatosplenomegaly. No bruits or masses. Good bowel sounds. Extremities: no cyanosis, clubbing, rash,  R and LLE 1+ edema  + erythema on RLE Neuro: alert & orientedx3, cranial nerves grossly intact. moves all 4 extremities w/o difficulty. Affect pleasant  Telemetry: AV paced 70s   Labs: Basic Metabolic Panel:  Recent Labs Lab 12/25/14 0459 12/26/14 0410 12/27/14 0425  12/28/14 0900 12/28/14 1157 12/29/14 0450  NA 138 138 137 137  --  136  K 3.8 3.3* 3.0* 3.3*  --  3.9  CL 91* 88* 81* 79*  --  81*  CO2 44* 41* 49* 50*  --  47*  GLUCOSE 97 116* 108* 166*  --  163*  BUN 17 16 13 20   --  24*  CREATININE 1.35* 1.35* 1.49* 1.77*  --  2.06*  CALCIUM 8.5* 8.7* 9.2 9.0  --  9.1  MG  --   --   --   --  2.0  --     Liver Function Tests:  Recent Labs Lab 12/27/14 1009  ALBUMIN 2.8*   No results for input(s): LIPASE, AMYLASE in the last 168 hours. No results for input(s): AMMONIA in the last 168 hours.  CBC:  Recent Labs Lab 12/26/14 0410  WBC 4.8  NEUTROABS 2.7  HGB 8.0*  HCT 29.0*  MCV 93.5  PLT 187    Cardiac Enzymes: No results for input(s): CKTOTAL, CKMB, CKMBINDEX, TROPONINI in the last 168 hours.  BNP: BNP (last 3 results)  Recent Labs  11/23/14 1744 12/26/14 0410  BNP 68.0 359.7*    ProBNP (last 3 results) No results for input(s): PROBNP in the last 8760 hours.    Other results:  Imaging: No results found.   Medications:     Scheduled Medications: . acetaZOLAMIDE  500 mg Oral Q12H  .  amoxicillin-clavulanate  1 tablet Oral Q12H  . aspirin EC  325 mg Oral Daily  . budesonide-formoterol  2 puff Inhalation BID  . carvedilol  3.125 mg Oral BID WC  . enoxaparin (LOVENOX) injection  0.5 mg/kg Subcutaneous Q24H  . fluticasone  1 spray Each Nare Daily  . hydrocortisone   Rectal BID  . insulin aspart  0-20 Units Subcutaneous TID WC  . insulin aspart  4 Units Subcutaneous TID WC  . insulin detemir  15 Units Subcutaneous Daily  . loratadine  10 mg Oral Daily  . pantoprazole  80 mg Oral Daily  . pravastatin  80 mg Oral Daily  . spironolactone  25 mg Oral Daily    Infusions:    PRN Medications: acetaminophen **OR** acetaminophen, albuterol, alum & mag hydroxide-simeth, dicyclomine, docusate sodium, ondansetron **OR** ondansetron (ZOFRAN) IV, oxyCODONE, polyethylene glycol, sodium chloride,  zolpidem   Assessment/Plan :   [name] is a 69 year old with history of morbid obesity, DM, HTN, asthma, OSA, ICD, GERD, arthritis, CRT-D 2008 [employer], CAD, S/P CABG x5 12/07/2014 admitted with RLE cellulitis and volume overload  1. Acute on chronic diastolic HF  --EF Q000111Q. RV normal 2. CAD s/p CABG 11/2014  3. Ab pain  4. Morbid obesity 5. H/o heart block s/p BiV pacer 6. Cellulitis R foot 7. Hypokalemia 8 CRT-D [employer] 9. Acute on chronic renal failure stage III   Volume status better but renal function slightly worse. Continue to hold lasix. Would continue diamox at least one more day. Hopefully can restart po diuretics soon. Would favor switch to torsemide 40 daily on d/c.   RLE still seems quite erythematous despite IV abx. May be worth asking ID to see if not improving.   Long discussion about fluid and salt restriction.   Refuses CPAP.     Length of Stay: 8  Bensimhon, Daniel MD  12/29/2014, 2:51 PM  Advanced Heart Failure Team Pager [phone] (M-F; 7a - 4p)  Please contact [facility] for night-coverage after hours (4p -7a ) and weekends on [email or web address]

## 2014-12-29 NOTE — Progress Notes (Signed)
CARDIAC REHAB PHASE I   PRE:  Rate/Rhythm: 91 paced  BP:  Supine:   Sitting: 93/39  Standing:    SaO2: 95% 2L, 92% RA then 88%RA  MODE:  Ambulation: 250 ft   POST:  Rate/Rhythm: 122  BP:  Supine:   Sitting: 99/63  Standing:    SaO2: 95%2L [id number] Pt wanted to walk. Pt walked 250 ft with rolling walker and asst x1. Tried RA in room but pt dropped to 88% so walked on 2L. Pt stopped several times to rest. Very DOE. Encouraged pursed lip breathing. Denied dizziness but tired by end of walk. To recliner with feet up.  PT to see today.   Graylon Good, RN BSN  12/29/2014 8:39 AM

## 2014-12-29 NOTE — Progress Notes (Addendum)
  Subjective: Feels better   Objective: Vital signs in last 24 hours: Temp:  [98.1 F (36.7 C)-98.7 F (37.1 C)] 98.1 F (36.7 C) (11/18 0439) Pulse Rate:  [78-85] 78 (11/18 0439) Cardiac Rhythm:  [-] Ventricular paced (11/18 0249) Resp:  [18] 18 (11/18 0439) BP: (95-118)/(43-56) 118/56 mmHg (11/18 0439) SpO2:  [93 %-99 %] 97 % (11/18 0439) Weight:  [251 lb 1.7 oz (113.9 kg)] 251 lb 1.7 oz (113.9 kg) (11/18 0439)  Hemodynamic parameters for last 24 hours:    Intake/Output from previous day: 11/17 0701 - 11/18 0700 In: 890 [P.O.:840; IV Piggyback:50] Out: 2900 [Urine:2900] Intake/Output this shift:    General appearance: alert, cooperative and no distress Heart: regular rate and rhythm Lungs: clear to auscultation bilaterally Abdomen: benign, obese Extremities: edema is improved Wound: incis healing well  Lab Results: No results for input(s): WBC, HGB, HCT, PLT in the last 72 hours. BMET:  Recent Labs  12/28/14 0900 12/29/14 0450  NA 137 136  K 3.3* 3.9  CL 79* 81*  CO2 50* 47*  GLUCOSE 166* 163*  BUN 20 24*  CREATININE 1.77* 2.06*  CALCIUM 9.0 9.1    PT/INR: No results for input(s): LABPROT, INR in the last 72 hours. ABG    Component Value Date/Time   PHART 7.506* 12/03/2014 1136   HCO3 32.0* 12/03/2014 1136   TCO2 33 12/03/2014 1136   ACIDBASEDEF 4.0* 11/29/2014 1541   O2SAT 94.0 12/03/2014 1136   CBG (last 3)   Recent Labs  12/28/14 1622 12/28/14 2044 12/29/14 0614  GLUCAP 111* 137* 154*    Meds Scheduled Meds: . acetaZOLAMIDE  500 mg Oral Q12H  . aspirin EC  325 mg Oral Daily  . budesonide-formoterol  2 puff Inhalation BID  . carvedilol  3.125 mg Oral BID WC  . enoxaparin (LOVENOX) injection  0.5 mg/kg Subcutaneous Q24H  . fluticasone  1 spray Each Nare Daily  . hydrocortisone   Rectal BID  . insulin aspart  0-20 Units Subcutaneous TID WC  . insulin aspart  4 Units Subcutaneous TID WC  . insulin detemir  15 Units Subcutaneous Daily   . loratadine  10 mg Oral Daily  . pantoprazole  80 mg Oral Daily  . piperacillin-tazobactam (ZOSYN)  IV  3.375 g Intravenous 3 times per day  . pravastatin  80 mg Oral Daily  . spironolactone  25 mg Oral Daily   Continuous Infusions:  PRN Meds:.acetaminophen **OR** acetaminophen, albuterol, alum & mag hydroxide-simeth, dicyclomine, docusate sodium, ondansetron **OR** ondansetron (ZOFRAN) IV, oxyCODONE, polyethylene glycol, sodium chloride, zolpidem  Xrays No results found.  Assessment/Plan:  1 feels better 2 CHF team assisting with volume management, creat cont to rise so will need to cont to be careful with diuretics, Cl and CO2 slightly improved -lasix gtt stopped yesterday 3 I believe we can stop abx or change to po soon 4 she is slowly doing better with rehab/ambulation  LOS: 8 days    GOLD,WAYNE E 12/29/2014  Patient seen and examined Creatinine up as noted above. Has CKD stage 3 Will defer to heart failure team on diuresis  Remo Lipps C. Roxan Hockey, MD [facility] [phone]

## 2014-12-30 LAB — BASIC METABOLIC PANEL
Anion gap: 8 (ref 5–15)
BUN: 34 mg/dL — ABNORMAL HIGH (ref 6–20)
CO2: 42 mmol/L — ABNORMAL HIGH (ref 22–32)
Calcium: 8.6 mg/dL — ABNORMAL LOW (ref 8.9–10.3)
Chloride: 85 mmol/L — ABNORMAL LOW (ref 101–111)
Creatinine, Ser: 2.01 mg/dL — ABNORMAL HIGH (ref 0.44–1.00)
GFR calc Af Amer: 28 mL/min — ABNORMAL LOW (ref >60)
GFR calc non Af Amer: 24 mL/min — ABNORMAL LOW (ref >60)
Glucose, Bld: 110 mg/dL — ABNORMAL HIGH (ref 65–99)
Potassium: 3 mmol/L — ABNORMAL LOW (ref 3.5–5.1)
Sodium: 135 mmol/L (ref 135–145)

## 2014-12-30 LAB — GLUCOSE, CAPILLARY
Glucose-Capillary: 97 mg/dL (ref 65–99)
Glucose-Capillary: 92 mg/dL (ref 65–99)
Glucose-Capillary: 127 mg/dL — ABNORMAL HIGH (ref 65–99)
Glucose-Capillary: 107 mg/dL — ABNORMAL HIGH (ref 65–99)

## 2014-12-30 MED ORDER — POTASSIUM CHLORIDE CRYS ER 20 MEQ PO TBCR
40.0000 meq | EXTENDED_RELEASE_TABLET | Freq: Once | ORAL | Status: AC
  Administered 2014-12-30: 40 meq via ORAL

## 2014-12-30 MED ORDER — CARVEDILOL 6.25 MG PO TABS
6.2500 mg | ORAL_TABLET | Freq: Two times a day (BID) | ORAL | Status: DC
  Administered 2015-01-01 (×2): 6.25 mg via ORAL
  Filled 2014-12-30 (×3): qty 1

## 2014-12-30 NOTE — Progress Notes (Signed)
CARDIAC REHAB PHASE I   PRE:  Rate/Rhythm: V paced 73  BP:    Sitting: 95/61     SaO2: 97 2l/min  MODE:  Ambulation: 480 ft   POST:  Rate/Rhythem: 74  BP:    Sitting: 116/53     SaO2: 100% on 3l/min decreased to 2l/min upon return to room 11;00-11:45 Patient ambulated in hallway using rolling walker on 2l/min. Patient increased distance today.  Patient complained of arms feeling weak halfway through walk. Oxygen increased to 3l/min for remainder of walk. Nautika stopped for several rest breaks but did well. Patient assisted back to recliner with call bell within reach.  Genevive Printup, Christa See RN BSN

## 2014-12-30 NOTE — Progress Notes (Addendum)
      [address]       [place] [address]             [phone]          Subjective: Feels fairly well  Objective: Vital signs in last 24 hours: Temp:  [97.7 F (36.5 C)-98.2 F (36.8 C)] 97.7 F (36.5 C) (11/19 0446) Pulse Rate:  [77-113] 77 (11/19 0446) Cardiac Rhythm:  [-] Ventricular paced (11/19 0724) Resp:  [17-18] 18 (11/19 0446) BP: (100-109)/(47-55) 106/47 mmHg (11/19 0446) SpO2:  [97 %-100 %] 100 % (11/19 0446) Weight:  [249 lb 12.8 oz (113.309 kg)] 249 lb 12.8 oz (113.309 kg) (11/19 0446)  Hemodynamic parameters for last 24 hours:    Intake/Output from previous day: 11/18 0701 - 11/19 0700 In: 960 [P.O.:960] Out: 750 [Urine:750] Intake/Output this shift:    General appearance: alert, cooperative and no distress Heart: regular rate and rhythm Lungs: clear to auscultation bilaterally Abdomen: benign Extremities: edema improved Wound: brawney discoloration RLE  Lab Results: No results for input(s): WBC, HGB, HCT, PLT in the last 72 hours. BMET:  Recent Labs  12/29/14 0450 12/30/14 0443  NA 136 135  K 3.9 3.0*  CL 81* 85*  CO2 47* 42*  GLUCOSE 163* 110*  BUN 24* 34*  CREATININE 2.06* 2.01*  CALCIUM 9.1 8.6*    PT/INR: No results for input(s): LABPROT, INR in the last 72 hours. ABG    Component Value Date/Time   PHART 7.506* 12/03/2014 1136   HCO3 32.0* 12/03/2014 1136   TCO2 33 12/03/2014 1136   ACIDBASEDEF 4.0* 11/29/2014 1541   O2SAT 94.0 12/03/2014 1136   CBG (last 3)   Recent Labs  12/29/14 1626 12/29/14 2112 12/30/14 0628  GLUCAP 132* 94 97    Meds Scheduled Meds: . acetaZOLAMIDE  500 mg Oral Q12H  . amoxicillin-clavulanate  1 tablet Oral Q12H  . aspirin EC  325 mg Oral Daily  . budesonide-formoterol  2 puff Inhalation BID  . carvedilol  3.125 mg Oral BID WC  . enoxaparin (LOVENOX) injection  0.5 mg/kg Subcutaneous Q24H  . fluticasone  1 spray Each Nare Daily  . hydrocortisone   Rectal BID  .  insulin aspart  0-20 Units Subcutaneous TID WC  . insulin aspart  4 Units Subcutaneous TID WC  . insulin detemir  15 Units Subcutaneous Daily  . loratadine  10 mg Oral Daily  . pantoprazole  80 mg Oral Daily  . pravastatin  80 mg Oral Daily  . spironolactone  25 mg Oral Daily   Continuous Infusions:  PRN Meds:.acetaminophen **OR** acetaminophen, albuterol, alum & mag hydroxide-simeth, dicyclomine, docusate sodium, ondansetron **OR** ondansetron (ZOFRAN) IV, oxyCODONE, polyethylene glycol, sodium chloride, zolpidem  Xrays No results found.  Assessment/Plan:  1 stable and improving 2 creat slightly better, chloride and CO2 improved as well, needs K+ replacement- cont to hold lasix. On diamox/spironolactone 3 brawny induration RLE - may be component of venous insufficiency also as a contributor to the edema. Now on augmentin for cellulitis component coverage. No recent fevers of leukocytosis.  4 will increase coreg for tachycardia at times  LOS: 9 days    [name],[name] 12/30/2014   Chart reviewed, patient examined, agree with above.

## 2014-12-30 NOTE — Progress Notes (Signed)
Subjective:    [name] is a 69 year old with history of morbid obesity, DM, HTN, asthma, OSA, ICD, GERD, arthritis, CRT-D 2008 [employer], CAD, S/P CABG x5 12/07/2014 admitted with RLE cellulitis and volume overload.   Weight 113 I/O +210    Objective:   Weight Range:  Vital Signs:   Temp:  [97.7 F (36.5 C)-98.2 F (36.8 C)] 97.7 F (36.5 C) (11/19 0446) Pulse Rate:  [77-113] 77 (11/19 0446) Resp:  [17-18] 18 (11/19 0446) BP: (100-109)/(47-55) 106/47 mmHg (11/19 0446) SpO2:  [97 %-100 %] 100 % (11/19 0446) Weight:  [113.309 kg (249 lb 12.8 oz)] 113.309 kg (249 lb 12.8 oz) (11/19 0446) Last BM Date: 12/29/14  Weight change: Filed Weights   12/28/14 0455 12/29/14 0439 12/30/14 0446  Weight: 114.306 kg (252 lb) 113.9 kg (251 lb 1.7 oz) 113.309 kg (249 lb 12.8 oz)    Intake/Output:   Intake/Output Summary (Last 24 hours) at 12/30/14 0731 Last data filed at 12/29/14 2131  Gross per 24 hour  Intake    960 ml  Output    750 ml  Net    210 ml     Physical Exam: General:  Chronically ill  appearing. Obese HEENT: normal Neck: supple.  Cor: RRR Lungs: diminished BS LLL Abdomen: obese, soft, nontender, nondistended.  Extremities: R and LLE 1+ edema  + mild erythema on RLE Neuro: grossly intact  Telemetry: AV paced   Labs: Basic Metabolic Panel:  Recent Labs Lab 12/26/14 0410 12/27/14 0425 12/28/14 0900 12/28/14 1157 12/29/14 0450 12/30/14 0443  NA 138 137 137  --  136 135  K 3.3* 3.0* 3.3*  --  3.9 3.0*  CL 88* 81* 79*  --  81* 85*  CO2 41* 49* 50*  --  47* 42*  GLUCOSE 116* 108* 166*  --  163* 110*  BUN 16 13 20   --  24* 34*  CREATININE 1.35* 1.49* 1.77*  --  2.06* 2.01*  CALCIUM 8.7* 9.2 9.0  --  9.1 8.6*  MG  --   --   --  2.0  --   --     Liver Function Tests:  Recent Labs Lab 12/27/14 1009  ALBUMIN 2.8*    CBC:  Recent Labs Lab 12/26/14 0410  WBC 4.8  NEUTROABS 2.7  HGB 8.0*  HCT 29.0*  MCV 93.5  PLT 187     BNP: BNP (last 3 results)  Recent Labs  11/23/14 1744 12/26/14 0410  BNP 68.0 359.7*       Medications:     Scheduled Medications: . acetaZOLAMIDE  500 mg Oral Q12H  . amoxicillin-clavulanate  1 tablet Oral Q12H  . aspirin EC  325 mg Oral Daily  . budesonide-formoterol  2 puff Inhalation BID  . carvedilol  3.125 mg Oral BID WC  . enoxaparin (LOVENOX) injection  0.5 mg/kg Subcutaneous Q24H  . fluticasone  1 spray Each Nare Daily  . hydrocortisone   Rectal BID  . insulin aspart  0-20 Units Subcutaneous TID WC  . insulin aspart  4 Units Subcutaneous TID WC  . insulin detemir  15 Units Subcutaneous Daily  . loratadine  10 mg Oral Daily  . pantoprazole  80 mg Oral Daily  . pravastatin  80 mg Oral Daily  . spironolactone  25 mg Oral Daily    Infusions:    PRN Medications: acetaminophen **OR** acetaminophen, albuterol, alum & mag hydroxide-simeth, dicyclomine, docusate sodium, ondansetron **OR** ondansetron (ZOFRAN) IV,  oxyCODONE, polyethylene glycol, sodium chloride, zolpidem   Assessment/Plan :   [name] is a 69 year old with history of morbid obesity, DM, HTN, asthma, OSA, ICD, GERD, arthritis, CRT-D 2008 [employer], CAD, S/P CABG x5 12/07/2014 admitted with RLE cellulitis and volume overload  1. Acute on chronic diastolic HF  --EF Q000111Q. RV normal 2. CAD s/p CABG 11/2014  3. Ab pain  4. Morbid obesity 5. H/o heart block s/p BiV pacer 6. Cellulitis R foot 7. Hypokalemia 8 CRT-D [employer] 9. Acute on chronic renal failure stage III   Volume status improved since admission. Creatinine remains mildly elevated. I will continue to hold diuretics today and resume Demadex 40 mg daily tomorrow pending renal function. Continue Diamox for now. Cellulitis appears to be improving. Possible discharge Monday a.m. if stable.   Length of Stay: 9  Kirk Ruths MD  12/30/2014, 7:31 AM

## 2014-12-31 LAB — BASIC METABOLIC PANEL
Anion gap: 9 (ref 5–15)
BUN: 38 mg/dL — ABNORMAL HIGH (ref 6–20)
CO2: 36 mmol/L — ABNORMAL HIGH (ref 22–32)
Calcium: 8.8 mg/dL — ABNORMAL LOW (ref 8.9–10.3)
Chloride: 91 mmol/L — ABNORMAL LOW (ref 101–111)
Creatinine, Ser: 1.83 mg/dL — ABNORMAL HIGH (ref 0.44–1.00)
GFR calc Af Amer: 31 mL/min — ABNORMAL LOW (ref >60)
GFR calc non Af Amer: 27 mL/min — ABNORMAL LOW (ref >60)
Glucose, Bld: 105 mg/dL — ABNORMAL HIGH (ref 65–99)
Potassium: 2.9 mmol/L — ABNORMAL LOW (ref 3.5–5.1)
Sodium: 136 mmol/L (ref 135–145)

## 2014-12-31 LAB — GLUCOSE, CAPILLARY
Glucose-Capillary: 87 mg/dL (ref 65–99)
Glucose-Capillary: 113 mg/dL — ABNORMAL HIGH (ref 65–99)
Glucose-Capillary: 100 mg/dL — ABNORMAL HIGH (ref 65–99)
Glucose-Capillary: 100 mg/dL — ABNORMAL HIGH (ref 65–99)

## 2014-12-31 LAB — CBC WITH DIFFERENTIAL/PLATELET
Basophils Absolute: 0.1 10*3/uL (ref 0.0–0.1)
Basophils Relative: 1 %
Eosinophils Absolute: 0.2 10*3/uL (ref 0.0–0.7)
Eosinophils Relative: 2 %
HCT: 31.8 % — ABNORMAL LOW (ref 36.0–46.0)
Hemoglobin: 9 g/dL — ABNORMAL LOW (ref 12.0–15.0)
Lymphocytes Relative: 19 %
Lymphs Abs: 1.6 10*3/uL (ref 0.7–4.0)
MCH: 25.9 pg — ABNORMAL LOW (ref 26.0–34.0)
MCHC: 28.3 g/dL — ABNORMAL LOW (ref 30.0–36.0)
MCV: 91.4 fL (ref 78.0–100.0)
Monocytes Absolute: 0.7 10*3/uL (ref 0.1–1.0)
Monocytes Relative: 8 %
Neutro Abs: 5.9 10*3/uL (ref 1.7–7.7)
Neutrophils Relative %: 70 %
Platelets: 253 10*3/uL (ref 150–400)
RBC: 3.48 MIL/uL — ABNORMAL LOW (ref 3.87–5.11)
RDW: 16.2 % — ABNORMAL HIGH (ref 11.5–15.5)
WBC: 8.5 10*3/uL (ref 4.0–10.5)

## 2014-12-31 MED ORDER — ENOXAPARIN SODIUM 60 MG/0.6ML ~~LOC~~ SOLN
0.5000 mg/kg | SUBCUTANEOUS | Status: DC
  Administered 2014-12-31 – 2015-01-01 (×2): 55 mg via SUBCUTANEOUS
  Filled 2014-12-31: qty 0.6

## 2014-12-31 MED ORDER — POTASSIUM CHLORIDE CRYS ER 20 MEQ PO TBCR
40.0000 meq | EXTENDED_RELEASE_TABLET | Freq: Once | ORAL | Status: AC
  Administered 2014-12-31: 40 meq via ORAL

## 2014-12-31 MED ORDER — TORSEMIDE 20 MG PO TABS
40.0000 mg | ORAL_TABLET | Freq: Every day | ORAL | Status: DC
  Administered 2015-01-01: 40 mg via ORAL
  Filled 2014-12-31: qty 2

## 2014-12-31 NOTE — Care Management Important Message (Signed)
Important Message  Patient Details  Name: [name] MRN: [id number] Date of Birth: Sep 24, 1945   Medicare Important Message Given:  Yes    Nathen May 12/31/2014, 8:37 AM

## 2014-12-31 NOTE — Progress Notes (Signed)
PT refuses CPAP. Pt encouraged to call RT if pt changes mind.

## 2014-12-31 NOTE — Progress Notes (Signed)
Subjective:    [name] is a 69 year old with history of morbid obesity, DM, HTN, asthma, OSA, ICD, GERD, arthritis, CRT-D 2008 [employer], CAD, S/P CABG x5 12/07/2014 admitted with RLE cellulitis and volume overload.   Weight 113 I/O +840  Denies dyspnea or Chest pain  Objective:   Weight Range:  Vital Signs:   Temp:  [97.8 F (36.6 C)-98.2 F (36.8 C)] 97.8 F (36.6 C) (11/20 0516) Pulse Rate:  [80-93] 93 (11/20 0752) Resp:  [18-19] 18 (11/20 0516) BP: (97-110)/(41-53) 98/49 mmHg (11/20 0752) SpO2:  [96 %-100 %] 96 % (11/20 0516) Weight:  [113.445 kg (250 lb 1.6 oz)] 113.445 kg (250 lb 1.6 oz) (11/20 0516) Last BM Date: 12/30/14  Weight change: Filed Weights   12/29/14 0439 12/30/14 0446 12/31/14 0516  Weight: 113.9 kg (251 lb 1.7 oz) 113.309 kg (249 lb 12.8 oz) 113.445 kg (250 lb 1.6 oz)    Intake/Output:   Intake/Output Summary (Last 24 hours) at 12/31/14 0834 Last data filed at 12/31/14 0824  Gross per 24 hour  Intake   1200 ml  Output      0 ml  Net   1200 ml     Physical Exam: General:  Chronically ill  appearing. Obese HEENT: normal Neck: supple.  Cor: RRR Lungs: CTA Abdomen: obese, soft, nontender, nondistended.  Extremities: R and LLE 1+ edema  + mild erythema on RLE Neuro: grossly intact  Telemetry: AV paced   Labs: Basic Metabolic Panel:  Recent Labs Lab 12/27/14 0425 12/28/14 0900 12/28/14 1157 12/29/14 0450 12/30/14 0443 12/31/14 0635  NA 137 137  --  136 135 136  K 3.0* 3.3*  --  3.9 3.0* 2.9*  CL 81* 79*  --  81* 85* 91*  CO2 49* 50*  --  47* 42* 36*  GLUCOSE 108* 166*  --  163* 110* 105*  BUN 13 20  --  24* 34* 38*  CREATININE 1.49* 1.77*  --  2.06* 2.01* 1.83*  CALCIUM 9.2 9.0  --  9.1 8.6* 8.8*  MG  --   --  2.0  --   --   --     Liver Function Tests:  Recent Labs Lab 12/27/14 1009  ALBUMIN 2.8*    CBC:  Recent Labs Lab 12/26/14 0410 12/31/14 0635  WBC 4.8 8.5  NEUTROABS 2.7 6.0  HGB 8.0*  9.0*  HCT 29.0* 31.8*  MCV 93.5 91.4  PLT 187 PENDING    BNP: BNP (last 3 results)  Recent Labs  11/23/14 1744 12/26/14 0410  BNP 68.0 359.7*       Medications:     Scheduled Medications: . acetaZOLAMIDE  500 mg Oral Q12H  . amoxicillin-clavulanate  1 tablet Oral Q12H  . aspirin EC  325 mg Oral Daily  . budesonide-formoterol  2 puff Inhalation BID  . carvedilol  6.25 mg Oral BID WC  . enoxaparin (LOVENOX) injection  0.5 mg/kg Subcutaneous Q24H  . fluticasone  1 spray Each Nare Daily  . hydrocortisone   Rectal BID  . insulin aspart  0-20 Units Subcutaneous TID WC  . insulin aspart  4 Units Subcutaneous TID WC  . insulin detemir  15 Units Subcutaneous Daily  . loratadine  10 mg Oral Daily  . pantoprazole  80 mg Oral Daily  . pravastatin  80 mg Oral Daily  . spironolactone  25 mg Oral Daily    Infusions:    PRN Medications: acetaminophen **OR** acetaminophen, albuterol,  alum & mag hydroxide-simeth, dicyclomine, docusate sodium, ondansetron **OR** ondansetron (ZOFRAN) IV, oxyCODONE, polyethylene glycol, sodium chloride, zolpidem   Assessment/Plan :   [name] is a 69 year old with history of morbid obesity, DM, HTN, asthma, OSA, ICD, GERD, arthritis, CRT-D 2008 [employer], CAD, S/P CABG x5 12/07/2014 admitted with RLE cellulitis and volume overload  1. Acute on chronic diastolic HF  --EF Q000111Q. RV normal 2. CAD s/p CABG 11/2014  3. Ab pain  4. Morbid obesity 5. H/o heart block s/p BiV pacer 6. Cellulitis R foot 7. Hypokalemia 8 CRT-D [employer] 9. Acute on chronic renal failure stage III   Volume status improved since admission. Creatinine mildly improved. Resume Demadex 40 mg daily; supplement K; follow renal function. Continue Diamox for now. Cellulitis appears to be improving. Possible discharge Monday a.m. if stable.   Length of Stay: Andrew MD  12/31/2014, 8:34 AM

## 2014-12-31 NOTE — Progress Notes (Addendum)
      [address]       [place] [address]             [phone]          Subjective: Looks and feels better, conts to diurese pretty well  Objective: Vital signs in last 24 hours: Temp:  [97.8 F (36.6 C)-98.2 F (36.8 C)] 97.8 F (36.6 C) (11/20 0516) Pulse Rate:  [80-91] 80 (11/20 0516) Cardiac Rhythm:  [-] Ventricular paced (11/19 1933) Resp:  [18-19] 18 (11/20 0516) BP: (97-110)/(41-53) 105/53 mmHg (11/20 0516) SpO2:  [96 %-100 %] 96 % (11/20 0516) Weight:  [250 lb 1.6 oz (113.445 kg)] 250 lb 1.6 oz (113.445 kg) (11/20 0516)  Hemodynamic parameters for last 24 hours:    Intake/Output from previous day: 11/19 0701 - 11/20 0700 In: 840 [P.O.:840] Out: -  Intake/Output this shift:    General appearance: alert, cooperative and no distress Heart: regular rate and rhythm Lungs: clear to auscultation bilaterally Abdomen: benign Extremities: edema appears stable Wound: RLE looks about the same  Lab Results: No results for input(s): WBC, HGB, HCT, PLT in the last 72 hours. BMET:  Recent Labs  12/29/14 0450 12/30/14 0443  NA 136 135  K 3.9 3.0*  CL 81* 85*  CO2 47* 42*  GLUCOSE 163* 110*  BUN 24* 34*  CREATININE 2.06* 2.01*  CALCIUM 9.1 8.6*    PT/INR: No results for input(s): LABPROT, INR in the last 72 hours. ABG    Component Value Date/Time   PHART 7.506* 12/03/2014 1136   HCO3 32.0* 12/03/2014 1136   TCO2 33 12/03/2014 1136   ACIDBASEDEF 4.0* 11/29/2014 1541   O2SAT 94.0 12/03/2014 1136   CBG (last 3)   Recent Labs  12/30/14 1613 12/30/14 2108 12/31/14 0614  GLUCAP 127* 107* 87    Meds Scheduled Meds: . acetaZOLAMIDE  500 mg Oral Q12H  . amoxicillin-clavulanate  1 tablet Oral Q12H  . aspirin EC  325 mg Oral Daily  . budesonide-formoterol  2 puff Inhalation BID  . carvedilol  6.25 mg Oral BID WC  . enoxaparin (LOVENOX) injection  0.5 mg/kg Subcutaneous Q24H  . fluticasone  1 spray Each Nare Daily  .  hydrocortisone   Rectal BID  . insulin aspart  0-20 Units Subcutaneous TID WC  . insulin aspart  4 Units Subcutaneous TID WC  . insulin detemir  15 Units Subcutaneous Daily  . loratadine  10 mg Oral Daily  . pantoprazole  80 mg Oral Daily  . pravastatin  80 mg Oral Daily  . spironolactone  25 mg Oral Daily   Continuous Infusions:  PRN Meds:.acetaminophen **OR** acetaminophen, albuterol, alum & mag hydroxide-simeth, dicyclomine, docusate sodium, ondansetron **OR** ondansetron (ZOFRAN) IV, oxyCODONE, polyethylene glycol, sodium chloride, zolpidem  Xrays No results found.  Assessment/Plan:  1 clinically conts to show good improvement, BMET is pending 2 heart failure team conts to assist 3 gets tachy to 120's at times, I increased beta blocker yesterday, cont to observe 4 cellulitis - cont augmentin for now 5 poss d/c in am if all agree, trying to wean O2 off today 6 sugars well controlled  LOS: 10 days    GOLD,WAYNE E 12/31/2014   Chart reviewed, patient examined, agree with above. Dr. Roxan Hockey will decide tomorrow about going home on oral antibiotic.

## 2015-01-01 DIAGNOSIS — E876 Hypokalemia: Secondary | ICD-10-CM

## 2015-01-01 DIAGNOSIS — I5031 Acute diastolic (congestive) heart failure: Secondary | ICD-10-CM

## 2015-01-01 LAB — BASIC METABOLIC PANEL
Anion gap: 7 (ref 5–15)
BUN: 30 mg/dL — ABNORMAL HIGH (ref 6–20)
CO2: 33 mmol/L — ABNORMAL HIGH (ref 22–32)
Calcium: 8.8 mg/dL — ABNORMAL LOW (ref 8.9–10.3)
Chloride: 96 mmol/L — ABNORMAL LOW (ref 101–111)
Creatinine, Ser: 1.5 mg/dL — ABNORMAL HIGH (ref 0.44–1.00)
GFR calc Af Amer: 40 mL/min — ABNORMAL LOW (ref >60)
GFR calc non Af Amer: 34 mL/min — ABNORMAL LOW (ref >60)
Glucose, Bld: 109 mg/dL — ABNORMAL HIGH (ref 65–99)
Potassium: 2.9 mmol/L — ABNORMAL LOW (ref 3.5–5.1)
Sodium: 136 mmol/L (ref 135–145)

## 2015-01-01 LAB — GLUCOSE, CAPILLARY
Glucose-Capillary: 101 mg/dL — ABNORMAL HIGH (ref 65–99)
Glucose-Capillary: 93 mg/dL (ref 65–99)
Glucose-Capillary: 116 mg/dL — ABNORMAL HIGH (ref 65–99)

## 2015-01-01 MED ORDER — CARVEDILOL 6.25 MG PO TABS: 6.2500 mg | ORAL_TABLET | Freq: Two times a day (BID) | ORAL | Status: DC

## 2015-01-01 MED ORDER — AMOXICILLIN-POT CLAVULANATE 500-125 MG PO TABS: 1.0000 | ORAL_TABLET | Freq: Two times a day (BID) | ORAL | Status: DC

## 2015-01-01 MED ORDER — OXYCODONE HCL 5 MG PO TABS: 5.0000 mg | ORAL_TABLET | Freq: Four times a day (QID) | ORAL | Status: DC | PRN

## 2015-01-01 MED ORDER — POTASSIUM CHLORIDE CRYS ER 20 MEQ PO TBCR
40.0000 meq | EXTENDED_RELEASE_TABLET | ORAL | Status: DC
  Administered 2015-01-01 (×2): 40 meq via ORAL
  Filled 2015-01-01 (×2): qty 2

## 2015-01-01 MED ORDER — TORSEMIDE 20 MG PO TABS: 40.0000 mg | ORAL_TABLET | Freq: Every day | ORAL | Status: DC

## 2015-01-01 MED ORDER — POTASSIUM CHLORIDE CRYS ER 20 MEQ PO TBCR: 40.0000 meq | EXTENDED_RELEASE_TABLET | Freq: Two times a day (BID) | ORAL | Status: DC

## 2015-01-01 NOTE — Progress Notes (Signed)
Occupational Therapy Treatment Patient Details Name: [name] MRN: [id number] DOB: Feb 06, 1946 Today's Date: 01/01/2015    History of present illness pt is a 69 y.o. female with h/o recent CABG, DM, cardiomyopathy admitted from nursing facility for check and management of LE cellulitis.   OT comments  Pt progressing. Education provided in session. Gave pt AE from supply.  Follow Up Recommendations  Home health OT;Supervision/Assistance - 24 hour    Equipment Recommendations  3 in 1 bedside comode    Recommendations for Other Services      Precautions / Restrictions Precautions Precautions: Fall;Sternal Precaution Comments: reinforced/educated on sternal precautions Restrictions Weight Bearing Restrictions: No       Mobility Bed Mobility               General bed mobility comments: not assessed  Transfers Overall transfer level: Needs assistance   Transfers: Sit to/from Stand Sit to Stand: Supervision         General transfer comment: cues for hand placement; RW in front upon standing    Balance      No LOB in session. Used RW for ambulation.                             ADL Overall ADL's : Needs assistance/impaired     Grooming: Wash/dry face;Oral care;Standing Grooming Details (indicate cue type and reason): pt gathered items, otherwise supervision level at sink             Lower Body Dressing: Minimal assistance;Sitting/lateral leans;With adaptive equipment   Toilet Transfer: Min guard;Ambulation;RW (sit to stand from chair)           Functional mobility during ADLs: Min guard;Rolling walker General ADL Comments: Educated on AE and pt practiced with reacher and sockaid.       Vision                     Perception     Praxis      Cognition   Reports she was lethargic, but able to participate in session Behavior During Therapy: [facility] for tasks assessed/performed Overall Cognitive Status: Within  Functional Limits for tasks assessed                       Extremity/Trunk Assessment               Exercises     Shoulder Instructions       General Comments      Pertinent Vitals/ Pain       Pain Assessment: No/denies pain  Home Living                                          Prior Functioning/Environment              Frequency Min 2X/week     Progress Toward Goals  OT Goals(current goals can now be found in the care plan section)  Progress towards OT goals: Progressing toward goals  Acute Rehab OT Goals Patient Stated Goal: not stated OT Goal Formulation: With patient Time For Goal Achievement: 01/10/15 Potential to Achieve Goals: Good  Plan Discharge plan remains appropriate    Co-evaluation                 End of Session Equipment Utilized During  Treatment: Gait belt;Rolling walker;Other (comment) (AE)   Activity Tolerance Patient limited by fatigue;Patient limited by lethargy   Patient Left in chair;with call bell/phone within reach   Nurse Communication          Time: 0949-1000 OT Time Calculation (min): 11 min  Charges: OT General Charges $OT Visit: 1 Procedure OT Treatments $Self Care/Home Management : 8-22 mins   Benito Mccreedy OTR/L [id number] 01/01/2015, 10:14 AM

## 2015-01-01 NOTE — Discharge Instructions (Signed)
Cellulitis Cellulitis is an infection of the skin and the tissue under the skin. The infected area is usually red and tender. This happens most often in the arms and lower legs. HOME CARE   Take your antibiotic medicine as told. Finish the medicine even if you start to feel better.  Keep the infected arm or leg raised (elevated).  Put a warm cloth on the area up to 4 times per day.  Only take medicines as told by your doctor.  Keep all doctor visits as told. GET HELP IF:  You see red streaks on the skin coming from the infected area.  Your red area gets bigger or turns a dark color.  Your bone or joint under the infected area is painful after the skin heals.  Your infection comes back in the same area or different area.  You have a puffy (swollen) bump in the infected area.  You have new symptoms.  You have a fever. GET HELP RIGHT AWAY IF:   You feel very sleepy.  You throw up (vomit) or have watery poop (diarrhea).  You feel sick and have muscle aches and pains.   This information is not intended to replace advice given to you by your health care provider. Make sure you discuss any questions you have with your health care provider.   Document Released: 07/16/2007 Document Revised: 10/18/2014 Document Reviewed: 04/14/2011 Elsevier Interactive Patient Education 2016 Elsevier Inc. Edema Edema is an abnormal buildup of fluids in your bodytissues. Edema is somewhatdependent on gravity to pull the fluid to the lowest place in your body. That makes the condition more common in the legs and thighs (lower extremities). Painless swelling of the feet and ankles is common and becomes more likely as you get older. It is also common in looser tissues, like around your eyes.  When the affected area is squeezed, the fluid may move out of that spot and leave a dent for a few moments. This dent is called pitting.  CAUSES  There are many possible causes of edema. Eating too much salt and  being on your feet or sitting for a long time can cause edema in your legs and ankles. Hot weather may make edema worse. Common medical causes of edema include: Heart failure. Liver disease. Kidney disease. Weak blood vessels in your legs. Cancer. An injury. Pregnancy. Some medications. Obesity. SYMPTOMS  Edema is usually painless.Your skin may look swollen or shiny.  DIAGNOSIS  Your health care provider may be able to diagnose edema by asking about your medical history and doing a physical exam. You may need to have tests such as X-rays, an electrocardiogram, or blood tests to check for medical conditions that may cause edema.  TREATMENT  Edema treatment depends on the cause. If you have heart, liver, or kidney disease, you need the treatment appropriate for these conditions. General treatment may include: Elevation of the affected body part above the level of your heart. Compression of the affected body part. Pressure from elastic bandages or support stockings squeezes the tissues and forces fluid back into the blood vessels. This keeps fluid from entering the tissues. Restriction of fluid and salt intake. Use of a water pill (diuretic). These medications are appropriate only for some types of edema. They pull fluid out of your body and make you urinate more often. This gets rid of fluid and reduces swelling, but diuretics can have side effects. Only use diuretics as directed by your health care provider. HOME CARE INSTRUCTIONS  Keep the affected body part above the level of your heart when you are lying down.  Do not sit still or stand for prolonged periods.  Do not put anything directly under your knees when lying down. Do not wear constricting clothing or garters on your upper legs.  Exercise your legs to work the fluid back into your blood vessels. This may help the swelling go down.  Wear elastic bandages or support stockings to reduce ankle swelling as directed by your health  care provider.  Eat a low-salt diet to reduce fluid if your health care provider recommends it.  Only take medicines as directed by your health care provider. SEEK MEDICAL CARE IF:  Your edema is not responding to treatment. You have heart, liver, or kidney disease and notice symptoms of edema. You have edema in your legs that does not improve after elevating them.  You have sudden and unexplained weight gain. SEEK IMMEDIATE MEDICAL CARE IF:  You develop shortness of breath or chest pain.  You cannot breathe when you lie down. You develop pain, redness, or warmth in the swollen areas.  You have heart, liver, or kidney disease and suddenly get edema. You have a fever and your symptoms suddenly get worse. MAKE SURE YOU:  Understand these instructions. Will watch your condition. Will get help right away if you are not doing well or get worse.   This information is not intended to replace advice given to you by your health care provider. Make sure you discuss any questions you have with your health care provider.   Document Released: 01/27/2005 Document Revised: 02/17/2014 Document Reviewed: 11/19/2012 Elsevier Interactive Patient Education [employer].

## 2015-01-01 NOTE — Progress Notes (Signed)
      [address]       [address],[place] [address]             [phone]          Subjective: conts to feel pretty well, poor appetite  Objective: Vital signs in last 24 hours: Temp:  [98.6 F (37 C)-98.9 F (37.2 C)] 98.6 F (37 C) (11/21 0543) Pulse Rate:  [93-102] 94 (11/21 0543) Cardiac Rhythm:  [-] Ventricular paced;Bundle branch block;Heart block (11/20 1904) Resp:  [18] 18 (11/21 0543) BP: (94-102)/(43-53) 102/53 mmHg (11/21 0543) SpO2:  [92 %-100 %] 96 % (11/21 0543) Weight:  [246 lb 9.6 oz (111.857 kg)] 246 lb 9.6 oz (111.857 kg) (11/21 0543)  Hemodynamic parameters for last 24 hours:    Intake/Output from previous day: 11/20 0701 - 11/21 0700 In: 840 [P.O.:840] Out: -  Intake/Output this shift:    General appearance: alert, cooperative and no distress Heart: regular rate and rhythm Lungs: clear to auscultation bilaterally Abdomen: benign Extremities: edema conts to improve Wound: RLE stable appearance or brawney edema  Lab Results:  Recent Labs  12/31/14 0635  WBC 8.5  HGB 9.0*  HCT 31.8*  PLT 253   BMET:  Recent Labs  12/31/14 0635 01/01/15 0335  NA 136 136  K 2.9* 2.9*  CL 91* 96*  CO2 36* 33*  GLUCOSE 105* 109*  BUN 38* 30*  CREATININE 1.83* 1.50*  CALCIUM 8.8* 8.8*    PT/INR: No results for input(s): LABPROT, INR in the last 72 hours. ABG    Component Value Date/Time   PHART 7.506* 12/03/2014 1136   HCO3 32.0* 12/03/2014 1136   TCO2 33 12/03/2014 1136   ACIDBASEDEF 4.0* 11/29/2014 1541   O2SAT 94.0 12/03/2014 1136   CBG (last 3)   Recent Labs  12/31/14 1616 12/31/14 2114 01/01/15 0623  GLUCAP 100* 100* 101*    Meds Scheduled Meds: . acetaZOLAMIDE  500 mg Oral Q12H  . amoxicillin-clavulanate  1 tablet Oral Q12H  . aspirin EC  325 mg Oral Daily  . budesonide-formoterol  2 puff Inhalation BID  . carvedilol  6.25 mg Oral BID WC  . enoxaparin (LOVENOX) injection  0.5 mg/kg Subcutaneous Q24H  .  fluticasone  1 spray Each Nare Daily  . hydrocortisone   Rectal BID  . insulin aspart  0-20 Units Subcutaneous TID WC  . insulin aspart  4 Units Subcutaneous TID WC  . insulin detemir  15 Units Subcutaneous Daily  . loratadine  10 mg Oral Daily  . pantoprazole  80 mg Oral Daily  . pravastatin  80 mg Oral Daily  . spironolactone  25 mg Oral Daily  . torsemide  40 mg Oral Daily   Continuous Infusions:  PRN Meds:.acetaminophen **OR** acetaminophen, albuterol, alum & mag hydroxide-simeth, dicyclomine, docusate sodium, ondansetron **OR** ondansetron (ZOFRAN) IV, oxyCODONE, polyethylene glycol, sodium chloride, zolpidem  Xrays No results found.  Assessment/Plan:  1 steady progress 2 creat conts to improve, CO2 much improved, chloride improved, K+-needs replacement 3 tachy at times- ? Increase beta blocker further, paced rhythm 4 cellulitis is stable- on augmentin 5 poss d/c if ok with CHF team  LOS: 11 days    [name] E 01/01/2015

## 2015-01-01 NOTE — Progress Notes (Signed)
Physical Therapy Treatment Patient Details Name: [name] MRN: [id number] DOB: [birth year] Today's Date: 01/01/2015    History of Present Illness pt is a 69 y.o. female with h/o recent CABG, DM, cardiomyopathy admitted from nursing facility for check and management of LE cellulitis.    PT Comments    Pt tolerated stair training today with MIn A for balance/safety. DOE 3/4 post stair negotiation requiring long seated rest break due to fatigue and SOB. Instructed pt to have son present to assist with getting into house. Fatigues easily and requires frequent short standing rest breaks during gait training. Pt plans to d./c home today. Would benefit from HHPT to maximize independence and mobility.   Follow Up Recommendations  Home health PT;Supervision/Assistance - 24 hour     Equipment Recommendations  Rolling walker with 5" wheels    Recommendations for Other Services       Precautions / Restrictions Precautions Precautions: Fall;Sternal Precaution Comments: reinforced/educated on sternal precautions Restrictions Weight Bearing Restrictions: No    Mobility  Bed Mobility               General bed mobility comments: Sitting in chair upon PT arrival.   Transfers Overall transfer level: Needs assistance Equipment used: Rolling walker (2 wheeled) Transfers: Sit to/from Stand Sit to Stand: Supervision;Mod assist         General transfer comment: Supervision to stand from chair, Mod A to stand from low step. Cues to adhere to sternal precautions and not use UEs to push.  Ambulation/Gait Ambulation/Gait assistance: Modified independent (Device/Increase time) Ambulation Distance (Feet): 125 Feet (x2 bouts) Assistive device: Rolling walker (2 wheeled) Gait Pattern/deviations: Step-through pattern;Decreased stride length   Gait velocity interpretation: <1.8 ft/sec, indicative of risk for recurrent falls General Gait Details: Steady with RW. 3 standing rest breaks  and 1 seated rest break after stair training. Not able to get SP02 reading. HR ranged from 70-105 bpm. DOE.    Stairs Stairs: Yes Stairs assistance: Min assist Stair Management: One rail Left;Alternating pattern Number of Stairs: 3 General stair comments: Cues for technique/safety. Long seated rest break post stairs.  Wheelchair Mobility    Modified Rankin (Stroke Patients Only)       Balance Overall balance assessment: Needs assistance Sitting-balance support: Feet supported;No upper extremity supported Sitting balance-Leahy Scale: Good     Standing balance support: During functional activity Standing balance-Leahy Scale: Fair                      Cognition Arousal/Alertness: Awake/alert Behavior During Therapy: WFL for tasks assessed/performed Overall Cognitive Status: Within Functional Limits for tasks assessed                      Exercises      General Comments        Pertinent Vitals/Pain Pain Assessment: Faces Faces Pain Scale: Hurts a little bit Pain Location: incision after stair training Pain Descriptors / Indicators: Grimacing Pain Intervention(s): Monitored during session;Repositioned    Home Living                      Prior Function            PT Goals (current goals can now be found in the care plan section) Progress towards PT goals: Progressing toward goals    Frequency  Min 3X/week    PT Plan Current plan remains appropriate    Co-evaluation  End of Session Equipment Utilized During Treatment: Gait belt Activity Tolerance: Patient tolerated treatment well;Patient limited by fatigue Patient left: in chair;with call bell/phone within reach     Time: [id number] PT Time Calculation (min) (ACUTE ONLY): 27 min  Charges:  $Gait Training: 23-37 mins                    G Codes:      Karol Skarzynski A Jream Broyles 01/01/2015, 3:55 PM Wray Kearns, [place], DPT [phone]

## 2015-01-01 NOTE — Progress Notes (Signed)
Subjective:    Weak. But feels better. Denies SOB  Objective:   Weight Range:  Vital Signs:   Temp:  [98.6 F (37 C)-98.9 F (37.2 C)] 98.7 F (37.1 C) (11/21 0755) Pulse Rate:  [94-102] 99 (11/21 0755) Resp:  [18-28] 28 (11/21 0755) BP: (94-102)/(43-54) 100/54 mmHg (11/21 0755) SpO2:  [92 %-97 %] 94 % (11/21 0854) Weight:  [111.857 kg (246 lb 9.6 oz)] 111.857 kg (246 lb 9.6 oz) (11/21 0543) Last BM Date: 12/31/14  Weight change: Filed Weights   12/30/14 0446 12/31/14 0516 01/01/15 0543  Weight: 113.309 kg (249 lb 12.8 oz) 113.445 kg (250 lb 1.6 oz) 111.857 kg (246 lb 9.6 oz)    Intake/Output:   Intake/Output Summary (Last 24 hours) at 01/01/15 1004 Last data filed at 01/01/15 0836  Gross per 24 hour  Intake    720 ml  Output      0 ml  Net    720 ml     Physical Exam: General:  Chronically ill  appearing. Obese HEENT: normal Neck: supple.  Cor: RRR Lungs: CTA Abdomen: obese, soft, nontender, nondistended.  Extremities: R and LLE 1+ edema  + mild erythema on RLE Neuro: grossly intact  Telemetry: AV paced   Labs: Basic Metabolic Panel:  Recent Labs Lab 12/28/14 0900 12/28/14 1157 12/29/14 0450 12/30/14 0443 12/31/14 0635 01/01/15 0335  NA 137  --  136 135 136 136  K 3.3*  --  3.9 3.0* 2.9* 2.9*  CL 79*  --  81* 85* 91* 96*  CO2 50*  --  47* 42* 36* 33*  GLUCOSE 166*  --  163* 110* 105* 109*  BUN 20  --  24* 34* 38* 30*  CREATININE 1.77*  --  2.06* 2.01* 1.83* 1.50*  CALCIUM 9.0  --  9.1 8.6* 8.8* 8.8*  MG  --  2.0  --   --   --   --     Liver Function Tests:  Recent Labs Lab 12/27/14 1009  ALBUMIN 2.8*    CBC:  Recent Labs Lab 12/26/14 0410 12/31/14 0635  WBC 4.8 8.5  NEUTROABS 2.7 5.9  HGB 8.0* 9.0*  HCT 29.0* 31.8*  MCV 93.5 91.4  PLT 187 253    BNP: BNP (last 3 results)  Recent Labs  11/23/14 1744 12/26/14 0410  BNP 68.0 359.7*       Medications:     Scheduled Medications: . acetaZOLAMIDE  500 mg  Oral Q12H  . amoxicillin-clavulanate  1 tablet Oral Q12H  . aspirin EC  325 mg Oral Daily  . budesonide-formoterol  2 puff Inhalation BID  . carvedilol  6.25 mg Oral BID WC  . enoxaparin (LOVENOX) injection  0.5 mg/kg Subcutaneous Q24H  . fluticasone  1 spray Each Nare Daily  . hydrocortisone   Rectal BID  . insulin aspart  0-20 Units Subcutaneous TID WC  . insulin aspart  4 Units Subcutaneous TID WC  . insulin detemir  15 Units Subcutaneous Daily  . loratadine  10 mg Oral Daily  . pantoprazole  80 mg Oral Daily  . potassium chloride  40 mEq Oral BID  . pravastatin  80 mg Oral Daily  . spironolactone  25 mg Oral Daily  . torsemide  40 mg Oral Daily    Infusions:    PRN Medications: acetaminophen **OR** acetaminophen, albuterol, alum & mag hydroxide-simeth, dicyclomine, docusate sodium, ondansetron **OR** ondansetron (ZOFRAN) IV, oxyCODONE, polyethylene glycol, sodium chloride, zolpidem   Assessment/Plan :  [name] is a 69 year old with history of morbid obesity, DM, HTN, asthma, OSA, ICD, GERD, arthritis, CRT-D 2008 [employer], CAD, S/P CABG x5 12/07/2014 admitted with RLE cellulitis and volume overload  1. Acute on chronic diastolic HF  --EF [id number]. RV normal 2. CAD s/p CABG 11/2014  3. Ab pain  4. Morbid obesity 5. H/o heart block s/p BiV pacer 6. Cellulitis R foot 7. Hypokalemia 8 CRT-D [employer] 9. Acute on chronic renal failure stage III   Volume status improved since admission. Creatinine improved.   Can d/c home today on:  Demadex 40 daily Spiro 25 daily.  Kcl 20 daily  Stop diamox. Supp K+ aggressively prior to d/c with Kcl 120  Reinforced need for daily weights and reviewed use of sliding scale diuretics.  Will see in HF Clinic on Nov 30 at 3pm     Length of Stay: 11  Glori Bickers MD  01/01/2015, 10:04 AM

## 2015-01-01 NOTE — Progress Notes (Signed)
CARDIAC REHAB PHASE I   PRE:  Rate/Rhythm: pacing 93  BP:  Supine:   Sitting: 99/49  Standing:    SaO2: 96%RA  MODE:  Ambulation: 250 ft   POST:  Rate/Rhythm: 107  BP:  Supine:   Sitting: 118/52  Standing:    SaO2: 94-95%RA whole walk 1000-1100 Pt walked 250 ft on RA with rolling walker independently. Pt stated she felt weak. Encouraged her to purse lip breathe as she looked DOE but her sats stayed up. Pt becomes nervous and needs reassurance. She stated she has a few steps to go up. PT to see pt after she recovers from walk. Reviewed CHF booklet of when to call MD, purpose of daily weights and low sodium and fluid restrictions. Pt knows to call MD if gains 3 pounds overnight or 5 in a week.  Also enforced 2000 mg sodium restriction and 2L fluid restriction. Gave modified ex ed and will send update to CRP 2. Pt has walker in room for home. Does not meet requirements for home oxygen.    Graylon Good, RN BSN  01/01/2015 10:55 AM

## 2015-01-01 NOTE — Care Management Note (Addendum)
Case Management Note  Patient Details  Name: [name] MRN: [id number] Date of Birth: 07-09-1945  Subjective/Objective: Pt admitted with cellultius                   Action/Plan:  Pt recent s/p CABG and discharged to SNF due to not having 24 hour supervision.  Pt now has son [name] available to provide 24 hour supervision; both pt and son are requesting to not return to [facility], plan is for pt to return home with home health.  CM requested PT evaluation and will continue to monitor for disposition needs.    Expected Discharge Date:                  Expected Discharge Plan:  [place]  In-House Referral:  Clinical Social Work  Discharge planning Services  CM Consult  Post Acute Care Choice:    Choice offered to:     DME Arranged:   3:1, RW DME Agency:   [facility]  HH Arranged:   PT, OT, RN per [place]:   [facility]  Status of Service:  Complete,will sign off  Medicare Important Message Given:  Yes Date Medicare IM Given:    Medicare IM give by:    Date Additional Medicare IM Given:    Additional Medicare Important Message give by:     If discussed at [place] of Stay Meetings, dates discussed:    Additional Comments: 01/01/2015  CM assessed pt, discharge plan remains current; Pt will discharge home today in the care of son [name] with [facility].  CM requested HH/DME orders from physician prior to discharge.  CM contacted [facility] agency and informed of pt discharging today. Per bedside nurse;  Pt will not need home oxygen.  12/29/14 CM asked [facility] to review chart to determine if home O2 would be approved; per agency pt will qualify for home O2 if needed.  CM informed bedside nurse and will place a physician sticky note.  CM also requested HH/DME orders per recommendations.  12/28/14 In house therapy has placed recommendation regarding HH/DME; offered pt choice, pt chose [facility], agency contacted and referral accepted.  CM asked for HH/DME orders and clarity regarding  possible home O2 needs during progression rounds.  CM verified with pt that she was not on home oxygen, OT at bedside and will perform ambulation test required for home oxygen.  CM will continue to montior  12/27/14 Pt has been placed on Lasix drip per HF team consult.  CM will continue to monitor for disposition needs  11/151/6 PT evaluated pt 12/26/14; requested to evaluate pt again before confirming recommendations Maryclare Labrador, RN 01/01/2015, 11:17 AM

## 2015-01-02 ENCOUNTER — Ambulatory Visit: Payer: Medicare Other | Admitting: Thoracic Surgery (Cardiothoracic Vascular Surgery)

## 2015-01-02 DIAGNOSIS — I2510 Atherosclerotic heart disease of native coronary artery without angina pectoris: Secondary | ICD-10-CM | POA: Diagnosis not present

## 2015-01-02 DIAGNOSIS — Z48812 Encounter for surgical aftercare following surgery on the circulatory system: Secondary | ICD-10-CM | POA: Diagnosis not present

## 2015-01-03 ENCOUNTER — Telehealth: Payer: Self-pay | Admitting: Cardiovascular Disease

## 2015-01-03 NOTE — Telephone Encounter (Signed)
[place] SHE HAS PATIENT ON 3 WAY CALL- PATIENT  CONCERNED ABOUT  SOME MEDICATION THAT PATIENT WAS TAKING PRIOR TO HOSPITALIZATION- BUT WAS NOT LISTED AFTER DISCHARGE   MEDICATION IN QUESTION - [place]ART[place] ,AMLODIPINE  RN REVIEWED DISCHARGE SUMMARY- BOTH MEDICATION WERE NOT LISTED- SO DO NOT TAKE REVIEWED UPCOMING OFFICE VISIT- PATIENT AND JODY AWARE. JODY WANTED TO KNOW IF PATIENT HAS AN APPOINTMENT WITH HER PRIMARY-RN INFORMED JODY-NONE HAS BEEN SCHEDULE FROM DISCHARGE. PATIENT SHOULD CALL TO KEEP PRIMARY INVOLVE. VERBALIZED UNDERSTANDING.

## 2015-01-06 IMAGING — CT CT ABD-PELV W/O CM
2 of 4 series · 13 of 36 positions shown, 18 images · non-contrast
Comparison: CT abdomen pelvis of 04/23/2009 and ultrasound of the
abdomen of the same date

CLINICAL DATA: Left flank pain for 1 week, history of prior
appendectomy

EXAM:
CT ABDOMEN AND PELVIS WITHOUT CONTRAST
TECHNIQUE: Multidetector CT imaging of the abdomen and pelvis was performed
following the standard protocol without IV contrast.

[Series 601: coronal body · coronal · 0.98mm/px · 1 of 161 slices shown, 2 images]
[im 54/161  soft-tissue]
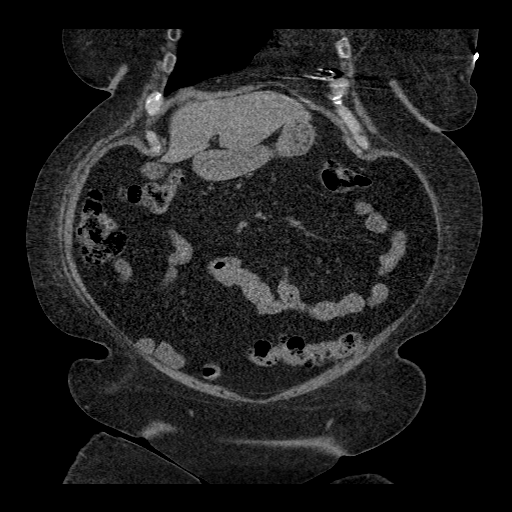
[im 54/161  bone]
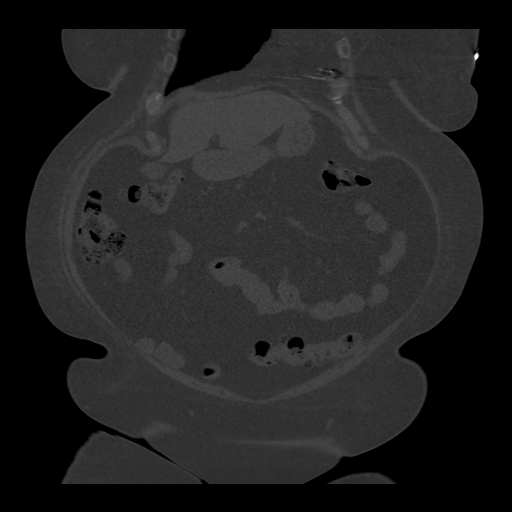

[Series 602: sagittal body · sagittal · 0.98mm/px · 12 of 196 slices shown, 16 images]
[im 11/196  soft-tissue]
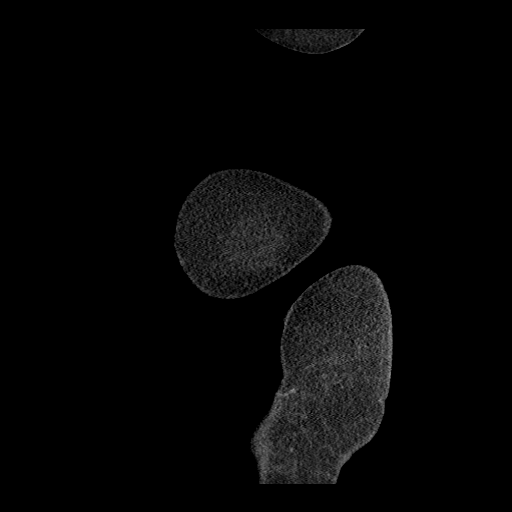
[im 11/196  lung]
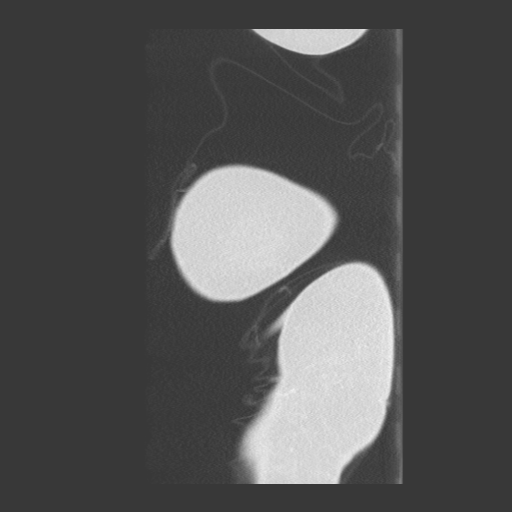
[im 11/196  bone]
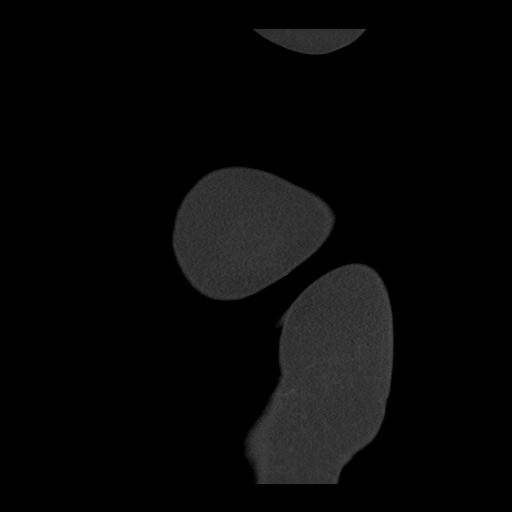
[im 21/196  lung]
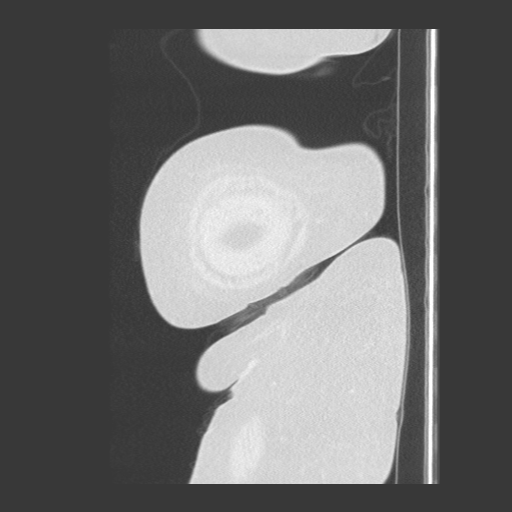
[im 31/196  soft-tissue]
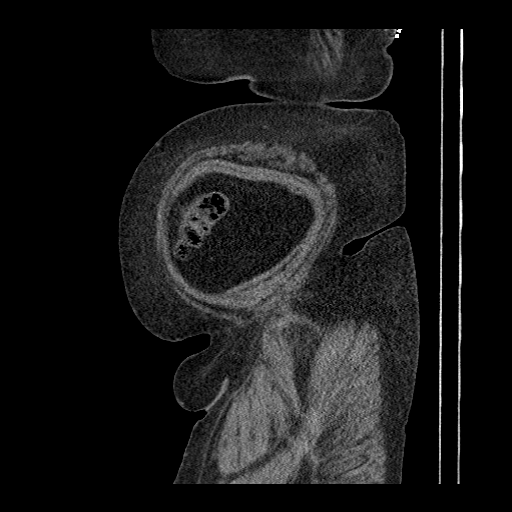
[im 31/196  lung]
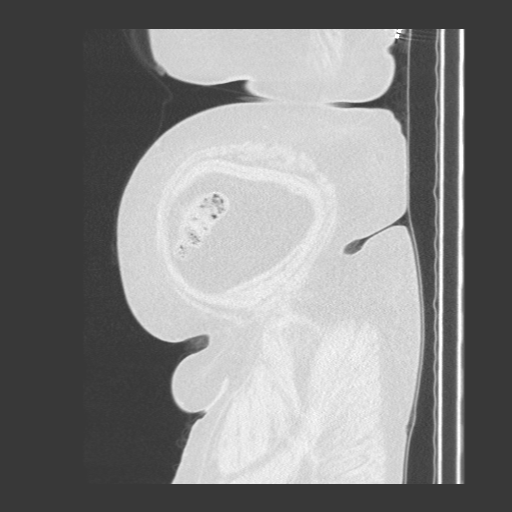
[im 42/196  lung]
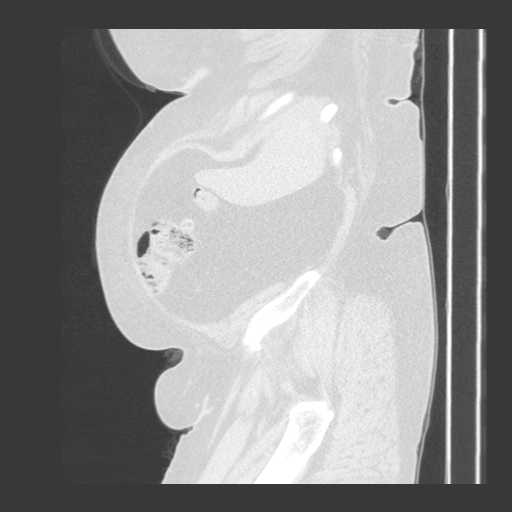
[im 52/196  soft-tissue]
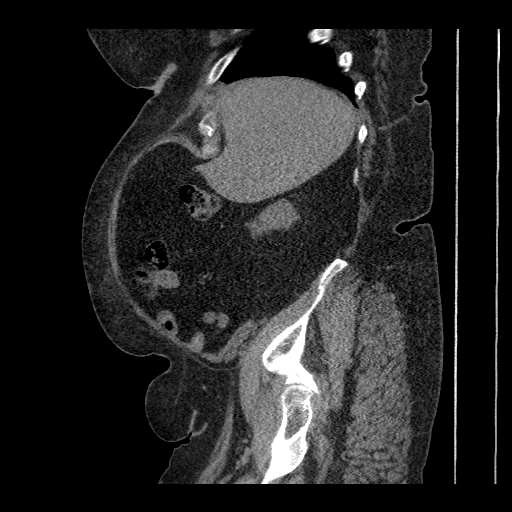
[im 72/196  soft-tissue]
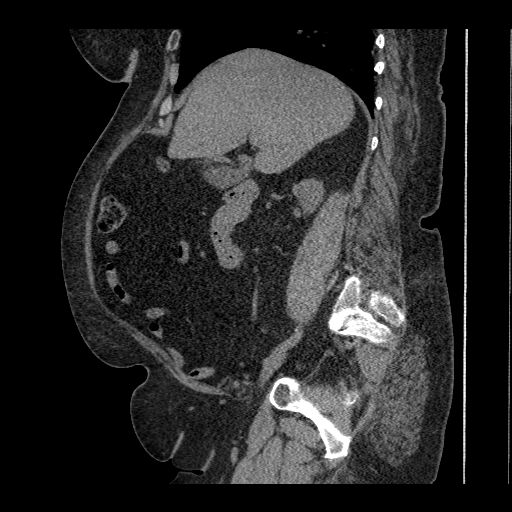
[im 93/196  soft-tissue]
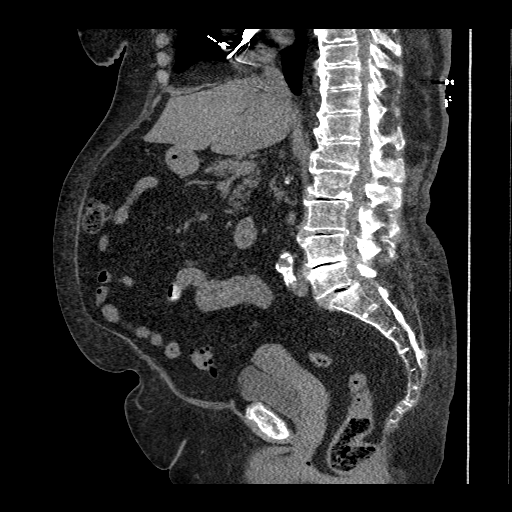
[im 103/196  soft-tissue]
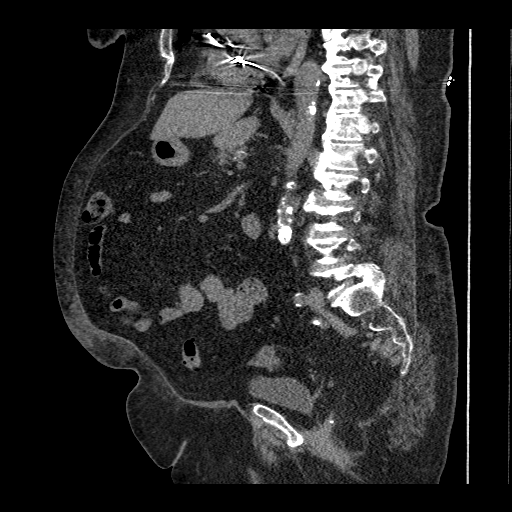
[im 124/196  soft-tissue]
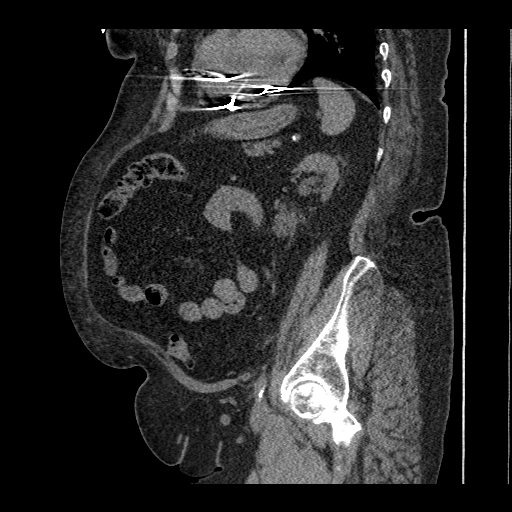
[im 144/196  soft-tissue]
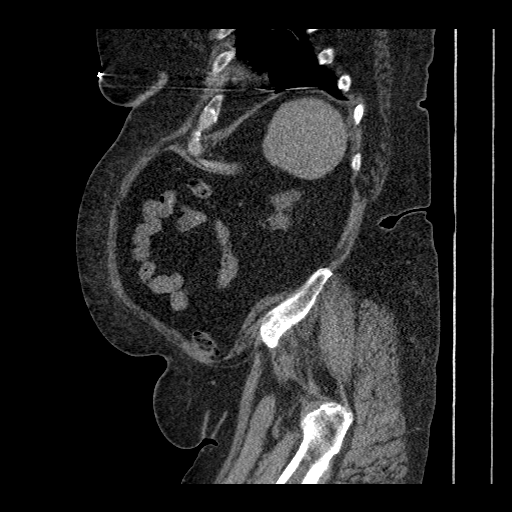
[im 165/196  soft-tissue]
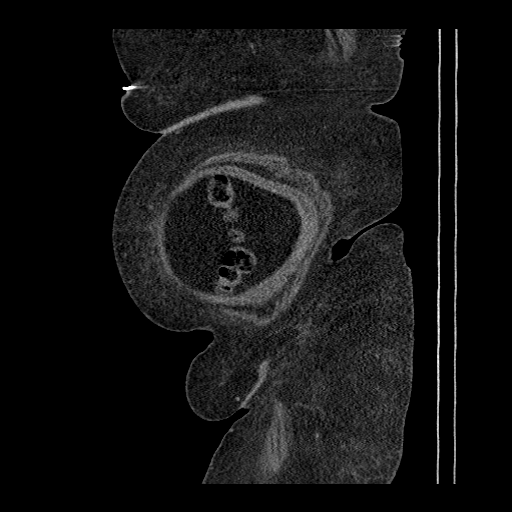
[im 165/196  bone]
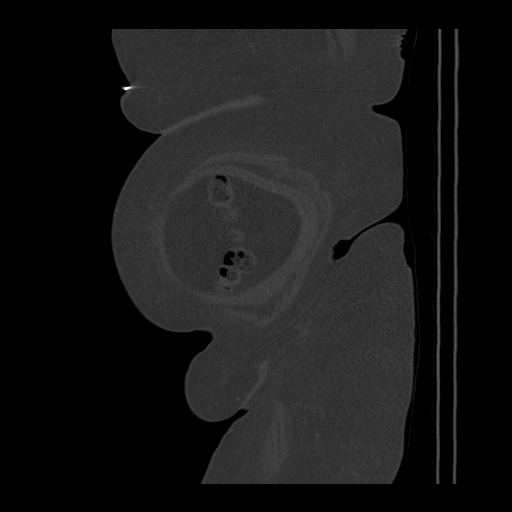
[im 185/196  soft-tissue]
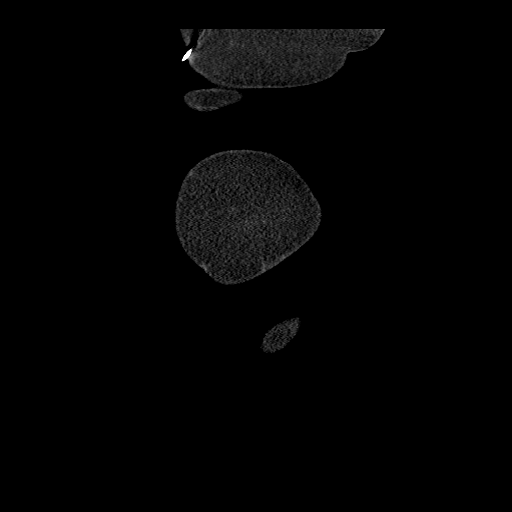

[13 of 36 positions shown; findings below may reference images not displayed]

FINDINGS: The lung bases are clear. Heart size is stable with permanent
pacemaker noted. The liver is unremarkable in the unenhanced state.
No calcified gallstones are seen. The pancreas is normal in size and
the pancreatic duct is not dilated. The adrenal glands and spleen
are unremarkable. The stomach is decompressed. No renal calculi are
seen. As noted on the prior study there is fullness of the left
pelvis which appears to be due to previously demonstrated left renal
parapelvic cysts, difficult to assess on this unenhanced CT. The
ureters are not dilated. The abdominal aorta is normal in caliber
with moderate atheromatous change present. No adenopathy is seen.

The distal ureters are normal in caliber and no distal ureteral
calculi are noted. The urinary bladder is decompressed but no
abnormality is seen. The uterus is normal in size. No adnexal lesion
is seen. No fluid is noted within the pelvis. There are multiple
rectosigmoid colon diverticula, but no diverticulitis is seen. A few
diverticula also are scattered throughout the more proximal colon.
The terminal ileum is unremarkable. There is soft tissue haziness at
the level of the umbilicus which could be related to a cellulitis.
Clinical correlation is recommended. No abscess is seen and no
hernia is noted. There are diffuse degenerative changes throughout
the lumbar spine with diffuse degenerative disc disease.
IMPRESSION: 1. No explanation for the patient's left flank pain is seen. No
renal or ureteral calculi are noted.
2. No change in apparent left parapelvic renal cysts.
3. Rectosigmoid colon diverticula.  No diverticulitis.
4. Diffuse degenerative disc disease throughout the lumbar spine.

## 2015-01-08 ENCOUNTER — Other Ambulatory Visit: Payer: Self-pay | Admitting: Thoracic Surgery (Cardiothoracic Vascular Surgery)

## 2015-01-08 DIAGNOSIS — Z951 Presence of aortocoronary bypass graft: Secondary | ICD-10-CM

## 2015-01-09 ENCOUNTER — Ambulatory Visit (INDEPENDENT_AMBULATORY_CARE_PROVIDER_SITE_OTHER): Payer: Self-pay | Admitting: Thoracic Surgery (Cardiothoracic Vascular Surgery)

## 2015-01-09 ENCOUNTER — Ambulatory Visit
Admission: RE | Admit: 2015-01-09 | Discharge: 2015-01-09 | Disposition: A | Discharge status: Home / Self Care | Payer: Medicare Other | Source: Ambulatory Visit | Attending: Thoracic Surgery (Cardiothoracic Vascular Surgery) | Admitting: Thoracic Surgery (Cardiothoracic Vascular Surgery)

## 2015-01-09 VITALS — BP 126/68 | HR 100 | Resp 18 | Ht 64.0 in | Wt 246.0 lb

## 2015-01-09 DIAGNOSIS — Z951 Presence of aortocoronary bypass graft: Secondary | ICD-10-CM

## 2015-01-09 DIAGNOSIS — L03115 Cellulitis of right lower limb: Secondary | ICD-10-CM

## 2015-01-09 DIAGNOSIS — I2510 Atherosclerotic heart disease of native coronary artery without angina pectoris: Secondary | ICD-10-CM

## 2015-01-09 MED ORDER — CIPROFLOXACIN HCL 500 MG PO TABS: 500.0000 mg | ORAL_TABLET | Freq: Two times a day (BID) | ORAL | Status: DC

## 2015-01-09 NOTE — Progress Notes (Signed)
[address]       [place] [address]             [phone]       HPI: [name] returns today for a scheduled postoperative follow-up visit.  [name] is a 69 year old woman who had CABG 5 on 11/29/2014. She was initially discharged on October 27. She was readmitted on 12/21/2014 after presenting to the office with acute on chronic diastolic heart failure and cellulitis. She was treated for both of those issues. She was given intravenous diuretics. She also was treated with vancomycin and Zosyn intravenously. She was seen by the heart failure team who helped manage her diuretics. She was changed to Augmentin prior to discharge.  She feels better today than she did a couple weeks ago. She is still having difficulty with nausea and says that food has a metallic taste to it. She is not having any anginal chest pain in her breathing has improved. She has had to use her inhaler a couple of times, which she says is common when the weather changes. She has not had any worsening of her swelling but has noted redness returning in her right leg.  Past Medical History  Diagnosis Date  . Asthma   . Hypertension   . Arthritis   . GERD (gastroesophageal reflux disease)   . Diverticulosis   . Anemia   . CAD (coronary artery disease)   . Fatty liver disease, nonalcoholic   . Internal hemorrhoids   . Gastritis   . Hyperplastic colon polyp   . OSA (obstructive sleep apnea)   . ICD (implantable cardiac defibrillator) in place   . Shortness of breath   . IBS (irritable bowel syndrome)   . H/O hiatal hernia   . Obesity   . Diabetes mellitus without complication ([place])   . Cellulitis and abscess of foot 12/2014    RT FOOT    Current Outpatient Prescriptions  Medication Sig Dispense Refill  . albuterol (PROVENTIL HFA;VENTOLIN HFA) 108 (90 BASE) MCG/ACT inhaler Inhale 2 puffs into the lungs every 4 (four) hours as needed for wheezing (wheezing).     [name] aspirin EC  325 MG EC tablet Take 1 tablet (325 mg total) by mouth daily. 30 tablet 0  . carvedilol (COREG) 6.25 MG tablet Take 1 tablet (6.25 mg total) by mouth 2 (two) times daily with a meal. 60 tablet 1  . cetirizine (ZYRTEC) 10 MG tablet Take 10 mg by mouth daily.     . clotrimazole (MYCELEX) 10 MG troche Take 10 mg by mouth every 4 (four) hours as needed (mouth pain).    [name] dicyclomine (BENTYL) 20 MG tablet Take 1 tablet (20 mg total) by mouth 2 (two) times daily as needed (pain). 60 tablet 0  . fluticasone (FLONASE) 50 MCG/ACT nasal spray Place 1 spray into the nose 2 (two) times daily as needed for rhinitis or allergies.     [name] omeprazole (PRILOSEC) 40 MG capsule Take 40 mg by mouth daily.     [name] oxyCODONE (OXY IR/ROXICODONE) 5 MG immediate release tablet Take 1-2 tablets (5-10 mg total) by mouth every 6 (six) hours as needed for moderate pain. 30 tablet 0  . potassium chloride SA (K-DUR,KLOR-CON) 20 MEQ tablet Take 20 mEq by mouth daily.     . pravastatin (PRAVACHOL) 80 MG tablet Take 1 tablet (80 mg total) by mouth every evening. 90 tablet 2  . spironolactone (ALDACTONE) 25 MG tablet Take  25 mg by mouth daily.    . SYMBICORT 160-4.5 MCG/ACT inhaler Inhale 2 puffs into the lungs 2 (two) times daily.    [name] torsemide (DEMADEX) 20 MG tablet Take 2 tablets (40 mg total) by mouth daily. 30 tablet 1  . triamcinolone cream (KENALOG) 0.1 % Apply 1 application topically daily.     . ciprofloxacin (CIPRO) 500 MG tablet Take 1 tablet (500 mg total) by mouth 2 (two) times daily. 14 tablet 0  . insulin aspart (NOVOLOG) 100 UNIT/ML injection Inject 4 Units into the skin 3 (three) times daily with meals. (Patient not taking: Reported on 01/09/2015) 10 mL 11  . insulin detemir (LEVEMIR) 100 UNIT/ML injection Inject 0.15 mLs (15 Units total) into the skin daily. (Patient not taking: Reported on 01/09/2015) 10 mL 11   No current facility-administered medications for this visit.    Physical Exam BP 126/68 mmHg  Pulse  100  Resp 18  Ht 5\' 4"  (1.626 m)  Wt 246 lb (111.585 kg)  BMI 42.21 kg/m2  SpO40 53% 69 year old morbidly obese woman in no acute distress Alert and oriented 3 with no focal neurologic deficits Cardiac regular rate and rhythm normal S1 and S2 Lungs clear with equal breath sounds bilaterally Sternum stable, incision healing well Lower extremities 1+ edema, persistent erythema right lower extremity  Diagnostic Tests: CHEST 2 VIEW  COMPARISON: Chest x-ray of 12/21/2014  FINDINGS: Aeration of the left lung base has improved with only minimal linear atelectasis remaining at the left lung base. The right lung is clear. Mild cardiomegaly is stable. Pacer and AICD leads remain. Median sternotomy sutures are noted. There are degenerative changes throughout the thoracic spine.  IMPRESSION: Improved aeration with only minimal linear atelectasis or scarring at the left lung base. Stable cardiomegaly.   Electronically Signed  By: Ivar Drape M.D.  On: 01/09/2015 12:07  I personally reviewed the chest x-ray confirmed with findings as noted above  Impression: 69 year old woman who is now almost 6 weeks out from coronary bypass grafting 5 for severe three-vessel disease with ischemic and nonischemic cardiomyopathy. She had a readmission due to congestive heart failure and cellulitis, but has now been home about a week and has been doing well.  She is still having some incisional pain. She is using oxycodone for that. She is only having to take that at night.  She still complains of nausea when she eats and a metallic taste of food. This may be medication related. I do not see any obvious suspects on her medication list currently. I recommended that she try using plastic utensils to see if that would help.  She still has significant erythema in the right leg consistent with an unresolved cellulitis. She had been on Augmentin for the past week or so. I'm going to put her on Cipro  500 mg by mouth twice a day for a week to see if we can clear that up completely.  Her diastolic congestive failure is well controlled. She sees the heart failure team tomorrow.  She had questions about whether any revision would be necessary for her pacemaker. She was seen by Dr. Lovena Le in the hospital. I asked her to discuss that issue with the heart failure team when she sees them tomorrow.  Plan: Cipro 500 mg by mouth twice a day for 1 week  Return in 3 weeks for follow-up  Melrose Nakayama, MD [facility] [phone]

## 2015-01-10 ENCOUNTER — Ambulatory Visit (HOSPITAL_COMMUNITY)
Admission: RE | Admit: 2015-01-10 | Discharge: 2015-01-10 | Disposition: A | Discharge status: Home / Self Care | Payer: Medicare Other | Source: Ambulatory Visit | Attending: Internal Medicine | Admitting: Internal Medicine

## 2015-01-10 VITALS — BP 106/68 | HR 107 | Wt 237.0 lb

## 2015-01-10 DIAGNOSIS — L03115 Cellulitis of right lower limb: Secondary | ICD-10-CM | POA: Insufficient documentation

## 2015-01-10 DIAGNOSIS — Z888 Allergy status to other drugs, medicaments and biological substances status: Secondary | ICD-10-CM | POA: Insufficient documentation

## 2015-01-10 DIAGNOSIS — Z86010 Personal history of colonic polyps: Secondary | ICD-10-CM | POA: Diagnosis not present

## 2015-01-10 DIAGNOSIS — I5033 Acute on chronic diastolic (congestive) heart failure: Secondary | ICD-10-CM

## 2015-01-10 DIAGNOSIS — R7309 Other abnormal glucose: Secondary | ICD-10-CM | POA: Insufficient documentation

## 2015-01-10 DIAGNOSIS — I129 Hypertensive chronic kidney disease with stage 1 through stage 4 chronic kidney disease, or unspecified chronic kidney disease: Secondary | ICD-10-CM | POA: Insufficient documentation

## 2015-01-10 DIAGNOSIS — Z87891 Personal history of nicotine dependence: Secondary | ICD-10-CM | POA: Insufficient documentation

## 2015-01-10 DIAGNOSIS — G4733 Obstructive sleep apnea (adult) (pediatric): Secondary | ICD-10-CM | POA: Diagnosis not present

## 2015-01-10 DIAGNOSIS — I2510 Atherosclerotic heart disease of native coronary artery without angina pectoris: Secondary | ICD-10-CM | POA: Insufficient documentation

## 2015-01-10 DIAGNOSIS — E119 Type 2 diabetes mellitus without complications: Secondary | ICD-10-CM | POA: Diagnosis not present

## 2015-01-10 DIAGNOSIS — Z95810 Presence of automatic (implantable) cardiac defibrillator: Secondary | ICD-10-CM | POA: Insufficient documentation

## 2015-01-10 DIAGNOSIS — N183 Chronic kidney disease, stage 3 (moderate): Secondary | ICD-10-CM | POA: Insufficient documentation

## 2015-01-10 DIAGNOSIS — K589 Irritable bowel syndrome without diarrhea: Secondary | ICD-10-CM | POA: Insufficient documentation

## 2015-01-10 DIAGNOSIS — Z882 Allergy status to sulfonamides status: Secondary | ICD-10-CM | POA: Diagnosis not present

## 2015-01-10 DIAGNOSIS — Z881 Allergy status to other antibiotic agents status: Secondary | ICD-10-CM | POA: Insufficient documentation

## 2015-01-10 DIAGNOSIS — K760 Fatty (change of) liver, not elsewhere classified: Secondary | ICD-10-CM | POA: Insufficient documentation

## 2015-01-10 DIAGNOSIS — Z91013 Allergy to seafood: Secondary | ICD-10-CM | POA: Insufficient documentation

## 2015-01-10 DIAGNOSIS — K219 Gastro-esophageal reflux disease without esophagitis: Secondary | ICD-10-CM | POA: Insufficient documentation

## 2015-01-10 DIAGNOSIS — I5032 Chronic diastolic (congestive) heart failure: Secondary | ICD-10-CM | POA: Insufficient documentation

## 2015-01-10 DIAGNOSIS — Z7982 Long term (current) use of aspirin: Secondary | ICD-10-CM | POA: Insufficient documentation

## 2015-01-10 DIAGNOSIS — Z79899 Other long term (current) drug therapy: Secondary | ICD-10-CM | POA: Diagnosis not present

## 2015-01-10 DIAGNOSIS — D649 Anemia, unspecified: Secondary | ICD-10-CM | POA: Diagnosis not present

## 2015-01-10 DIAGNOSIS — Z794 Long term (current) use of insulin: Secondary | ICD-10-CM | POA: Diagnosis not present

## 2015-01-10 DIAGNOSIS — Z951 Presence of aortocoronary bypass graft: Secondary | ICD-10-CM | POA: Insufficient documentation

## 2015-01-10 DIAGNOSIS — R6881 Early satiety: Secondary | ICD-10-CM | POA: Diagnosis not present

## 2015-01-10 DIAGNOSIS — J45909 Unspecified asthma, uncomplicated: Secondary | ICD-10-CM | POA: Diagnosis not present

## 2015-01-10 LAB — BASIC METABOLIC PANEL
Anion gap: 11 (ref 5–15)
BUN: 26 mg/dL — ABNORMAL HIGH (ref 6–20)
CO2: 25 mmol/L (ref 22–32)
Calcium: 9.5 mg/dL (ref 8.9–10.3)
Chloride: 103 mmol/L (ref 101–111)
Creatinine, Ser: 1.5 mg/dL — ABNORMAL HIGH (ref 0.44–1.00)
GFR calc Af Amer: 40 mL/min — ABNORMAL LOW (ref >60)
GFR calc non Af Amer: 34 mL/min — ABNORMAL LOW (ref >60)
Glucose, Bld: 133 mg/dL — ABNORMAL HIGH (ref 65–99)
Potassium: 3.7 mmol/L (ref 3.5–5.1)
Sodium: 139 mmol/L (ref 135–145)

## 2015-01-10 LAB — BRAIN NATRIURETIC PEPTIDE: B Natriuretic Peptide: 137.1 pg/mL — ABNORMAL HIGH (ref 0.0–100.0)

## 2015-01-10 NOTE — Patient Instructions (Signed)
Labs today  Please call your GI doctor for an appointment  Your physician recommends that you schedule a follow-up appointment in: 4 weeks

## 2015-01-10 NOTE — Progress Notes (Signed)
Patient ID: [name], female   DOB: 22-Sep-1945, 69 y.o.   MRN: [id number]  Primary Cardiologist: Dr. Dani Gobble Croitoru Primary HF: Dr. Haroldine Laws   HPI: [name] is a 69 year old with history of morbid obesity, DM, HTN, asthma, OSA, ICD, GERD, arthritis, CRT-D 2008 [employer], CAD, S/P CABG x5 11/29/2014   Discharged from [facility] 12/07/14 s/p CABG. Discharge weight 252.  Went to [facility].  Readmitted 12/21/14 with RLE cellulitis and volume overload with weight of 273 and complaints of dyspnea and orthopnea. RLE cellulitis treated with IV ABX. She was had brisk diuresis with lasix gtt. Diuresis slowed down with slight Cr bump. Acetazolamide added and monitored closely with history of sulfa allergy. Diuresed well, but lasix held with continued Cr bump. Transitioned to po torsemide for d/c. Discharge weight 246 lbs.  She presents today for post hospital follow up. Weight is down 9 lbs from discharge. She was seen by by Dr Roxan Hockey yesterday. Cipro added for continued leg redness. Still having incisional pain, but thought to be in normal range per Dr Roxan Hockey note (no changes to regimen made). Has not needed any extra lasix.  Weight stable at home.  Having nausea since she left the hospital as well as poor appetite/early satiety. Feels like she has some upper airway congestion, possibly her asthma acting up.  Echo 11/24/14 EF 55-60%, Grade 1 DD. Mild TR TEE 11/29/14 EF 55-60%, LVH, Mild Aortic regurge and Mitral regurge.  Past Medical History  Diagnosis Date  . Asthma   . Hypertension   . Arthritis   . GERD (gastroesophageal reflux disease)   . Diverticulosis   . Anemia   . CAD (coronary artery disease)   . Fatty liver disease, nonalcoholic   . Internal hemorrhoids   . Gastritis   . Hyperplastic colon polyp   . OSA (obstructive sleep apnea)   . ICD (implantable cardiac defibrillator) in place   . Shortness of breath   . IBS (irritable bowel syndrome)   . H/O hiatal  hernia   . Obesity   . Diabetes mellitus without complication ([place])   . Cellulitis and abscess of foot 12/2014    RT FOOT    Current Outpatient Prescriptions  Medication Sig Dispense Refill  . albuterol (PROVENTIL HFA;VENTOLIN HFA) 108 (90 BASE) MCG/ACT inhaler Inhale 2 puffs into the lungs every 4 (four) hours as needed for wheezing (wheezing).     [name] aspirin EC 325 MG EC tablet Take 1 tablet (325 mg total) by mouth daily. 30 tablet 0  . carvedilol (COREG) 6.25 MG tablet Take 1 tablet (6.25 mg total) by mouth 2 (two) times daily with a meal. 60 tablet 1  . cetirizine (ZYRTEC) 10 MG tablet Take 10 mg by mouth daily.     . clotrimazole (MYCELEX) 10 MG troche Take 10 mg by mouth every 4 (four) hours as needed (mouth pain).    [name] dicyclomine (BENTYL) 20 MG tablet Take 1 tablet (20 mg total) by mouth 2 (two) times daily as needed (pain). 60 tablet 0  . fluticasone (FLONASE) 50 MCG/ACT nasal spray Place 1 spray into the nose 2 (two) times daily as needed for rhinitis or allergies.     [name] insulin aspart (NOVOLOG) 100 UNIT/ML injection Inject 4 Units into the skin 3 (three) times daily with meals. (Patient not taking: Reported on 01/09/2015) 10 mL 11  . insulin detemir (LEVEMIR) 100 UNIT/ML injection Inject 0.15 mLs (15 Units total) into the skin daily. (Patient not taking:  Reported on 01/09/2015) 10 mL 11  . omeprazole (PRILOSEC) 40 MG capsule Take 40 mg by mouth daily.     [name] oxyCODONE (OXY IR/ROXICODONE) 5 MG immediate release tablet Take 1-2 tablets (5-10 mg total) by mouth every 6 (six) hours as needed for moderate pain. 30 tablet 0  . potassium chloride SA (K-DUR,KLOR-CON) 20 MEQ tablet Take 20 mEq by mouth daily.     . pravastatin (PRAVACHOL) 80 MG tablet Take 1 tablet (80 mg total) by mouth every evening. 90 tablet 2  . spironolactone (ALDACTONE) 25 MG tablet Take 25 mg by mouth daily.    . SYMBICORT 160-4.5 MCG/ACT inhaler Inhale 2 puffs into the lungs 2 (two) times daily.    [name] torsemide  (DEMADEX) 20 MG tablet Take 2 tablets (40 mg total) by mouth daily. 30 tablet 1  . triamcinolone cream (KENALOG) 0.1 % Apply 1 application topically daily.      No current facility-administered medications for this encounter.    Allergies  Allergen Reactions  . Ivp Dye [Iodinated Diagnostic Agents] Shortness Of Breath    No reaction to PO contrast with non-ionic dye.06-25-2014/rsm  . Shellfish-Derived Products Anaphylaxis  . Sulfa Antibiotics Shortness Of Breath  . Iodine Hives  . Zithromax [Azithromycin] Rash      Social History   Social History  . Marital Status: Widowed    Spouse Name: N/A  . Number of Children: 5  . Years of Education: N/A   Occupational History  . 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 Smoking status: Former Smoker -- 0.25 packs/day for 3 years    Types: Cigarettes    Quit date: 02/11/1968  . Smokeless tobacco: Never Used  . Alcohol Use: No  . Drug Use: No  . Sexual Activity: No   Other Topics Concern  . Not on file   Social History Narrative   Widowed last year. She lives with her two sons.      Family History  Problem Relation Age of Onset  . Breast cancer Mother   . Diabetes Mother   . Heart disease Maternal Grandmother   . Kidney disease Maternal Grandmother   . Diabetes Maternal Grandmother   . Glaucoma Maternal Aunt   . Heart disease Maternal Aunt   . Asthma Sister     Danley Danker Vitals:   01/10/15 1516  BP: 106/68  Pulse: 107  Weight: 237 lb (107.502 kg)  SpO2: 96%    PHYSICAL EXAM: General:  Well appearing. NAD HEENT: normal Neck: supple. no JVD. Carotids 2+ bilat; no bruits. No thyromegaly or nodule noted. Cor: PMI nondisplaced. RRR. No M/G/R. Lungs: Diminished throughout but clear Abdomen: Obese, soft, NT, ND. No HSM. No bruits or masses. +BS Extremities: no cyanosis or clubbing. RLE cellulitic but improved from hospital, trace RLE edema. Dry, patchy skin on toes, skin intact. Neuro: alert  & oriented x 3, cranial nerves grossly intact. moves all 4 extremities w/o difficulty. Affect pleasant.   ASSESSMENT & PLAN:  1. Chronic diastolic HF - TEE 99991111 EF 55-60%, mild LVH.  2. CAD s/p CABG 11/2014 3. Morbid obesity 4. H/o heart block s/p BiV pacer/CRT-D [employer] - with malfunction of A lead and chronic penetration of V lead into pericardium. EP followed in hospital at that time recommended no further intervention.  5. Cellulitis R foot 6. Chronic renal failure stage III  Pt had question about need for lead revision ref #4. Dr. Forde Dandy note 12/04/14 had stated he  hoped not to need to do procedure (lead revision) on her with high risk for infection. Follow up input from EP stated no plan for any intervention as in Cardiology note 12/06/14.  Volume status stable. Encouraged sliding scale diuretics for weight gains of 3 lbs overnight or 5 lbs in a week.  Should take an extra torsemide in those situations. Knows to call with any questions.  With persistent nausea, early satiety, and borderline diabetes, have concern for gastroparesis. Recommended she contact her GI doc. (sees [facility])  BMET/BNP today. Follow up in 4 weeks.   Legrand Como [address] [place], PA-C 01/10/2015 3:20 PM   Patient seen and examined with Oda Kilts, PA-C. We discussed all aspects of the encounter. I agree with the assessment and plan as stated above.   Volume status looks much better. Now euvolemic. Reinforced need for daily weights and reviewed use of sliding scale diuretics. Have asked her to f/u with GI regarding abdominal symptoms.   Demyah Smyre,MD 6:56 PM

## 2015-01-14 IMAGING — CR DG CHEST 2V
2 series · 2 of 2 positions shown · non-contrast
Comparison: Radiographs 11/18/2013.  CT 08/18/2013.

CLINICAL DATA: Anterior chest pain for 3 weeks recent cough and
fever. History of asthma. Initial encounter.

EXAM:
CHEST  2 VIEW

[view not recorded (1 of 2)]
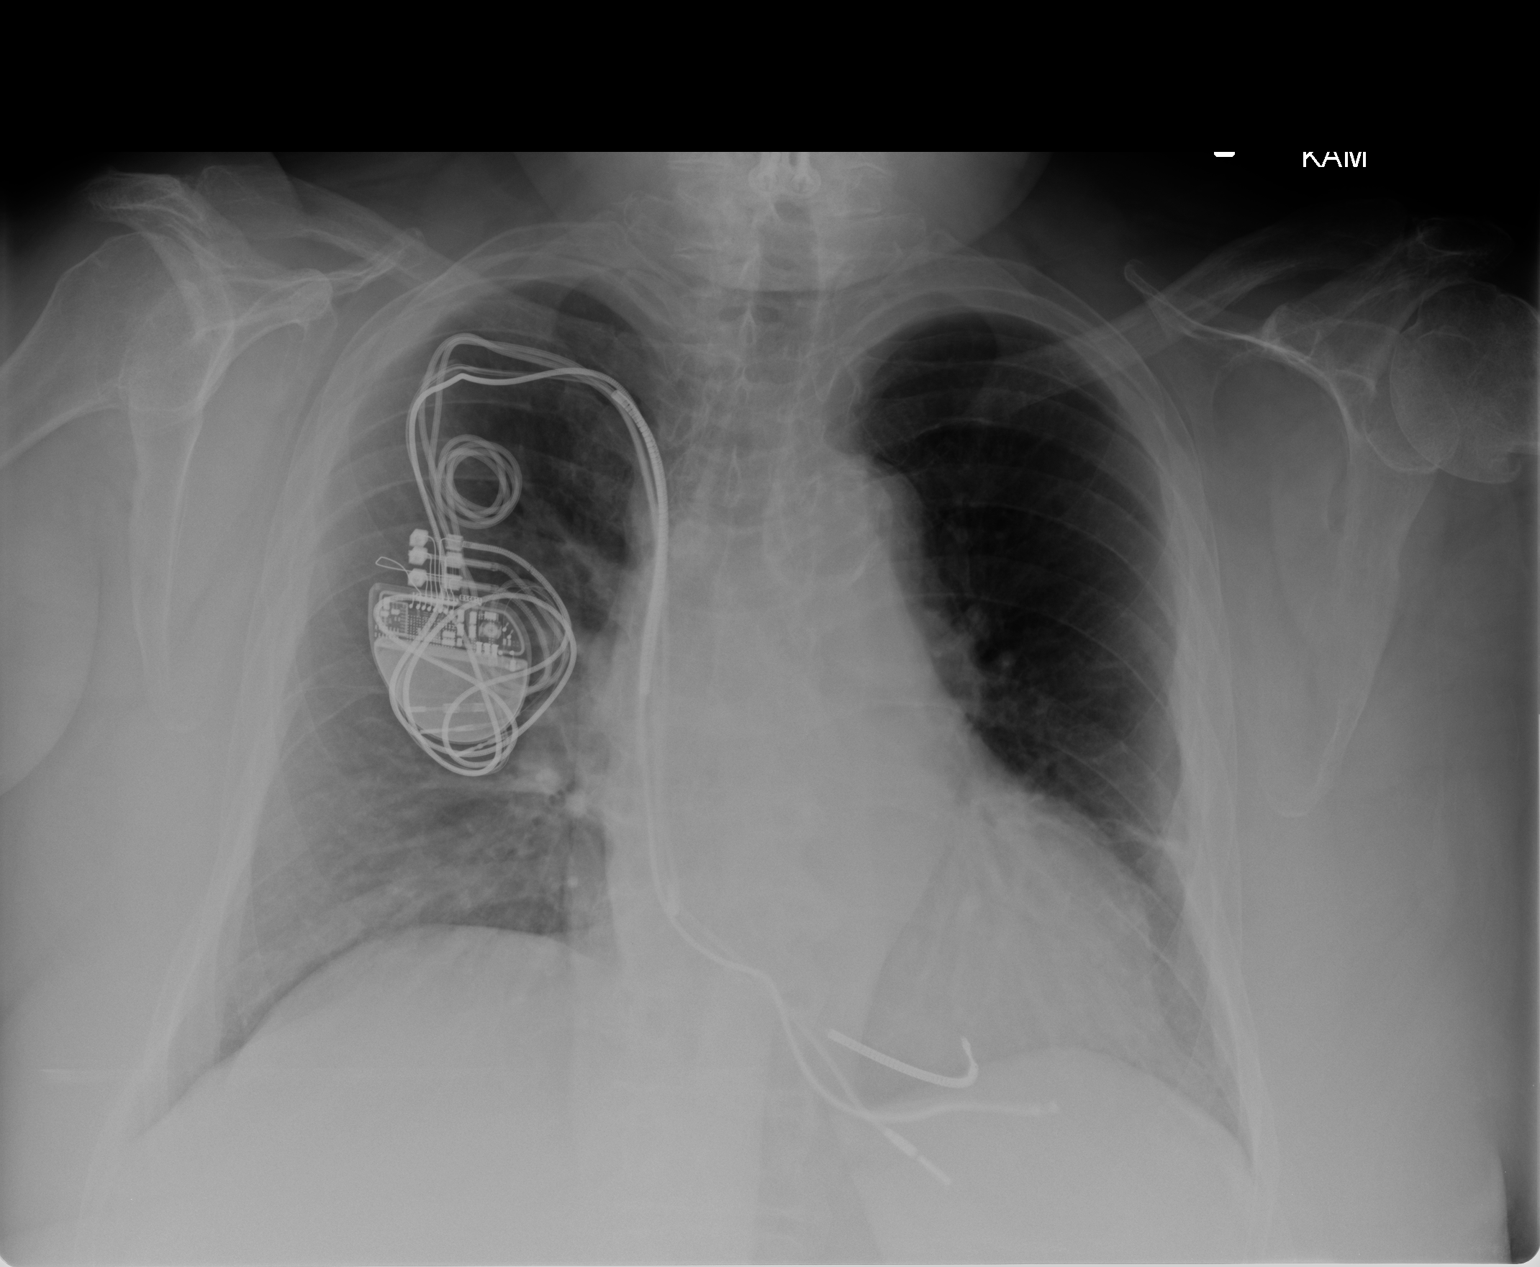

[view not recorded (2 of 2)]
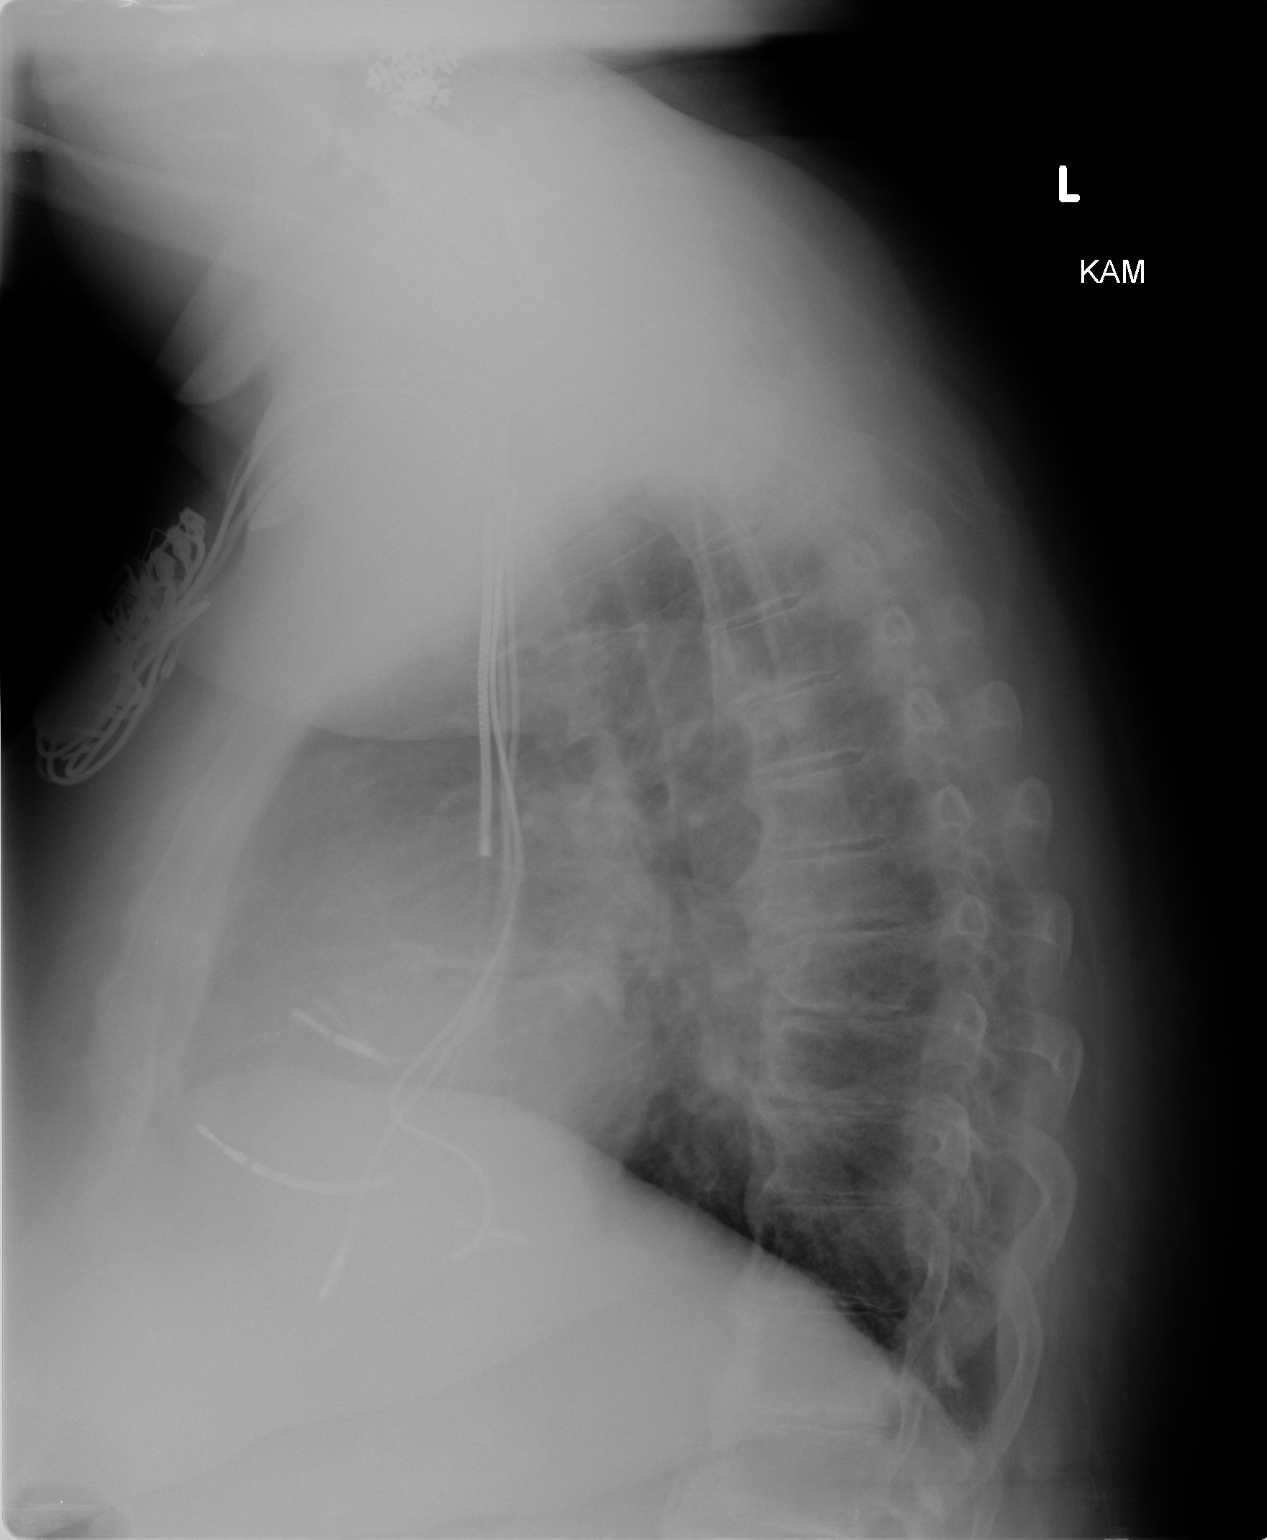

[2 of 2 positions shown; findings below may reference images not displayed]

FINDINGS: There is stable mild cardiomegaly. The right subclavian AICD leads
appear unchanged. There is stable aortic atherosclerosis. Linear
scarring in the lingula is stable. The lungs are otherwise clear.
There is no pleural effusion or pneumothorax. No acute osseous
findings evident.
IMPRESSION: Stable postoperative chest.  No acute cardiopulmonary process.

## 2015-01-18 ENCOUNTER — Encounter: Payer: Self-pay | Admitting: *Deleted

## 2015-01-20 ENCOUNTER — Other Ambulatory Visit: Payer: Self-pay | Admitting: Cardiovascular Disease

## 2015-01-22 NOTE — Telephone Encounter (Signed)
REFILL 

## 2015-01-23 ENCOUNTER — Ambulatory Visit (INDEPENDENT_AMBULATORY_CARE_PROVIDER_SITE_OTHER): Payer: Self-pay | Admitting: Thoracic Surgery (Cardiothoracic Vascular Surgery)

## 2015-01-23 VITALS — BP 108/73 | HR 87 | Resp 18 | Ht 64.0 in | Wt 246.0 lb

## 2015-01-23 DIAGNOSIS — L03115 Cellulitis of right lower limb: Secondary | ICD-10-CM

## 2015-01-23 DIAGNOSIS — I2510 Atherosclerotic heart disease of native coronary artery without angina pectoris: Secondary | ICD-10-CM

## 2015-01-23 DIAGNOSIS — Z951 Presence of aortocoronary bypass graft: Secondary | ICD-10-CM

## 2015-01-23 NOTE — Progress Notes (Signed)
[address]       [place] [address]             [phone]       HPI: [name] returns today for a scheduled follow-up visit.  [name] is a 69 year old woman who had CABG 5 on 11/29/2014. She was initially discharged on October 27. She was readmitted on 12/21/2014 after presenting to the office with acute on chronic diastolic heart failure and cellulitis. She was treated for both of those issues. She was given intravenous diuretics. She also was treated with vancomycin and Zosyn intravenously. She was seen by the heart failure team who helped manage her diuretics. She was changed to Augmentin prior to discharge.  I last saw her on 01/09/2015. That time she still had some cellulitis in her right leg. We gave her an additional prescription for ciprofloxacin 500 mg by mouth twice a day for a week.  Since her last visit she has been feeling better. She is starting to get some more energy. Her walking is still relatively limited. She is not having to take anything for incisional pain. Her swelling in her legs has improved as well. She does complain of some pain that shoots down from her hip along the outside of her left leg. She is working with physical therapy twice a week.  Past Medical History  Diagnosis Date  . Asthma   . Hypertension   . Arthritis   . GERD (gastroesophageal reflux disease)   . Diverticulosis   . Anemia   . CAD (coronary artery disease)   . Fatty liver disease, nonalcoholic   . Internal hemorrhoids   . Gastritis   . Hyperplastic colon polyp   . OSA (obstructive sleep apnea)   . ICD (implantable cardiac defibrillator) in place   . Shortness of breath   . IBS (irritable bowel syndrome)   . H/O hiatal hernia   . Obesity   . Diabetes mellitus without complication ([place])   . Cellulitis and abscess of foot 12/2014    RT FOOT      Current Outpatient Prescriptions  Medication Sig Dispense Refill  . albuterol (PROVENTIL  HFA;VENTOLIN HFA) 108 (90 BASE) MCG/ACT inhaler Inhale 2 puffs into the lungs every 4 (four) hours as needed for wheezing (wheezing).     [name] aspirin EC 325 MG EC tablet Take 1 tablet (325 mg total) by mouth daily. 30 tablet 0  . carvedilol (COREG) 6.25 MG tablet Take 1 tablet (6.25 mg total) by mouth 2 (two) times daily with a meal. 60 tablet 1  . cetirizine (ZYRTEC) 10 MG tablet Take 10 mg by mouth daily.     . clotrimazole (MYCELEX) 10 MG troche Take 10 mg by mouth every 4 (four) hours as needed (mouth pain).    [name] dicyclomine (BENTYL) 20 MG tablet Take 1 tablet (20 mg total) by mouth 2 (two) times daily as needed (pain). 60 tablet 0  . fluticasone (FLONASE) 50 MCG/ACT nasal spray Place 1 spray into the nose 2 (two) times daily as needed for rhinitis or allergies.     [name] KLOR-CON M20 20 MEQ tablet TAKE ONE TABLET BY MOUTH ONCE DAILY 60 tablet 0  . omeprazole (PRILOSEC) 40 MG capsule Take 40 mg by mouth daily.     . pravastatin (PRAVACHOL) 80 MG tablet Take 1 tablet (80 mg total) by mouth every evening. 90 tablet 2  . spironolactone (ALDACTONE) 25 MG tablet Take 25 mg by  mouth daily.    . SYMBICORT 160-4.5 MCG/ACT inhaler Inhale 2 puffs into the lungs 2 (two) times daily.    [name] torsemide (DEMADEX) 20 MG tablet Take 2 tablets (40 mg total) by mouth daily. 30 tablet 1  . triamcinolone cream (KENALOG) 0.1 % Apply 1 application topically daily.      No current facility-administered medications for this visit.    Physical Exam BP 108/73 mmHg  Pulse 87  Resp 18  Ht 5\' 4"  (1.626 m)  Wt 246 lb (111.585 kg)  BMI 42.21 kg/m2  SpO39 48% 69 year old woman in no acute distress Alert and oriented 3 with no focal deficits Lungs with diminished breath sounds both bases Cardiac regular rate and rhythm normal S1 and S2 Trace peripheral edema, minimal erythema right leg   Impression: [name] is a 69 year old woman who is now about 2 months out from coronary bypass grafting. She looks great today.  She has progressed remarkably over the past 3 weeks.  The cellulitis in her leg appears to be completely resolved. There is some minimal erythema but nothing that looks like an active infection.  She is having some sciatica in the left leg.  She will is followed by Dr. Haroldine Laws for her heart failure  Plan: Follow-up with Dr. Haroldine Laws  I will be happy see her back again any time if I can be of any further assistance with her care.  She knows to call if she has any problems with her incisions or feels like the right leg cellulitis is flaring up again.  Melrose Nakayama, MD [facility] [phone]

## 2015-01-26 ENCOUNTER — Ambulatory Visit: Payer: Medicare Other | Admitting: Gastroenterology

## 2015-01-31 ENCOUNTER — Telehealth: Payer: Self-pay | Admitting: Cardiovascular Disease

## 2015-01-31 NOTE — Telephone Encounter (Signed)
Returned patient's call.  She states she needs to reschedule device clinic appointment she had scheduled for tomorrow because her ride will be unavailable.  Rescheduled patient for 02/08/15 at 9:30am.  Patient is appreciative and denies questions or concerns at this time.

## 2015-01-31 NOTE — Telephone Encounter (Signed)
Follow Up ° °Pt returned call//  °

## 2015-02-08 ENCOUNTER — Encounter: Payer: Self-pay | Admitting: Internal Medicine

## 2015-02-08 ENCOUNTER — Ambulatory Visit (INDEPENDENT_AMBULATORY_CARE_PROVIDER_SITE_OTHER): Payer: Medicare Other | Admitting: *Deleted

## 2015-02-08 DIAGNOSIS — T82110D Breakdown (mechanical) of cardiac electrode, subsequent encounter: Secondary | ICD-10-CM

## 2015-02-08 LAB — CUP PACEART INCLINIC DEVICE CHECK
Date Time Interrogation Session: 20161229050000
Implantable Lead Implant Date: 20080923
Implantable Lead Implant Date: 20080923
Implantable Lead Implant Date: 20080923
Implantable Lead Location: 753858
Implantable Lead Location: 753859
Implantable Lead Location: 753860
Implantable Lead Model: 4285
Implantable Lead Model: 4555
Implantable Lead Model: 7120
Implantable Lead Serial Number: 170220
Implantable Lead Serial Number: 486468
Lead Channel Impedance Value: 570 Ohm
Lead Channel Impedance Value: 673 Ohm
Lead Channel Impedance Value: 816 Ohm
Lead Channel Pacing Threshold Amplitude: 0.8 V
Lead Channel Pacing Threshold Amplitude: 0.8 V
Lead Channel Pacing Threshold Amplitude: 5 V
Lead Channel Pacing Threshold Pulse Width: 0.4 ms
Lead Channel Pacing Threshold Pulse Width: 0.8 ms
Lead Channel Pacing Threshold Pulse Width: 2 ms
Lead Channel Sensing Intrinsic Amplitude: 1.1 mV
Lead Channel Setting Pacing Amplitude: 1.9 V
Lead Channel Setting Pacing Amplitude: 2 V
Lead Channel Setting Pacing Pulse Width: 0.4 ms
Lead Channel Setting Pacing Pulse Width: 0.8 ms
Lead Channel Setting Sensing Sensitivity: 2.5 mV
Lead Channel Setting Sensing Sensitivity: 2.5 mV
Pulse Gen Serial Number: 100731

## 2015-02-08 NOTE — Progress Notes (Signed)
Post CABG lead re-check. Device able to sense and track atrial events but unable to capture atrium. Device already programmed VDD. RV & LV leads show consistent capture. LV lead impedance significantly decreased (>700 ohm change) in impedance since Oct/2016. RV lead impedance consistent. 6 NSVT, 2 EGMs---both show noise during CABG. No changes made. Lead revision TBD by GT. ROV w/ CHF clinic for inc SOB & DOE 02/14/15. ROV w/ [facility] 05/08/15.

## 2015-02-13 ENCOUNTER — Emergency Department (HOSPITAL_COMMUNITY)
Admission: EM | Admit: 2015-02-13 | Discharge: 2015-02-13 | Disposition: A | Discharge status: Home / Self Care | Payer: Medicare Other | Attending: Emergency Medicine | Admitting: Emergency Medicine

## 2015-02-13 ENCOUNTER — Encounter (HOSPITAL_COMMUNITY): Payer: Self-pay | Admitting: Emergency Medicine

## 2015-02-13 ENCOUNTER — Emergency Department (HOSPITAL_COMMUNITY): Payer: Medicare Other

## 2015-02-13 DIAGNOSIS — R2242 Localized swelling, mass and lump, left lower limb: Secondary | ICD-10-CM | POA: Insufficient documentation

## 2015-02-13 DIAGNOSIS — I10 Essential (primary) hypertension: Secondary | ICD-10-CM | POA: Diagnosis not present

## 2015-02-13 DIAGNOSIS — Z95810 Presence of automatic (implantable) cardiac defibrillator: Secondary | ICD-10-CM | POA: Insufficient documentation

## 2015-02-13 DIAGNOSIS — K589 Irritable bowel syndrome without diarrhea: Secondary | ICD-10-CM | POA: Diagnosis not present

## 2015-02-13 DIAGNOSIS — R5383 Other fatigue: Secondary | ICD-10-CM | POA: Diagnosis not present

## 2015-02-13 DIAGNOSIS — K219 Gastro-esophageal reflux disease without esophagitis: Secondary | ICD-10-CM | POA: Diagnosis not present

## 2015-02-13 DIAGNOSIS — J45901 Unspecified asthma with (acute) exacerbation: Secondary | ICD-10-CM | POA: Insufficient documentation

## 2015-02-13 DIAGNOSIS — Z8669 Personal history of other diseases of the nervous system and sense organs: Secondary | ICD-10-CM | POA: Diagnosis not present

## 2015-02-13 DIAGNOSIS — Z79899 Other long term (current) drug therapy: Secondary | ICD-10-CM | POA: Insufficient documentation

## 2015-02-13 DIAGNOSIS — S81801A Unspecified open wound, right lower leg, initial encounter: Secondary | ICD-10-CM | POA: Insufficient documentation

## 2015-02-13 DIAGNOSIS — Y998 Other external cause status: Secondary | ICD-10-CM | POA: Diagnosis not present

## 2015-02-13 DIAGNOSIS — Z9861 Coronary angioplasty status: Secondary | ICD-10-CM | POA: Insufficient documentation

## 2015-02-13 DIAGNOSIS — Z9889 Other specified postprocedural states: Secondary | ICD-10-CM | POA: Insufficient documentation

## 2015-02-13 DIAGNOSIS — Z86010 Personal history of colonic polyps: Secondary | ICD-10-CM | POA: Insufficient documentation

## 2015-02-13 DIAGNOSIS — R079 Chest pain, unspecified: Secondary | ICD-10-CM | POA: Insufficient documentation

## 2015-02-13 DIAGNOSIS — Z862 Personal history of diseases of the blood and blood-forming organs and certain disorders involving the immune mechanism: Secondary | ICD-10-CM | POA: Insufficient documentation

## 2015-02-13 DIAGNOSIS — Z872 Personal history of diseases of the skin and subcutaneous tissue: Secondary | ICD-10-CM | POA: Diagnosis not present

## 2015-02-13 DIAGNOSIS — Z87891 Personal history of nicotine dependence: Secondary | ICD-10-CM | POA: Diagnosis not present

## 2015-02-13 DIAGNOSIS — I2510 Atherosclerotic heart disease of native coronary artery without angina pectoris: Secondary | ICD-10-CM | POA: Diagnosis not present

## 2015-02-13 DIAGNOSIS — Y9289 Other specified places as the place of occurrence of the external cause: Secondary | ICD-10-CM | POA: Insufficient documentation

## 2015-02-13 DIAGNOSIS — X58XXXA Exposure to other specified factors, initial encounter: Secondary | ICD-10-CM | POA: Insufficient documentation

## 2015-02-13 DIAGNOSIS — E669 Obesity, unspecified: Secondary | ICD-10-CM | POA: Insufficient documentation

## 2015-02-13 DIAGNOSIS — M1990 Unspecified osteoarthritis, unspecified site: Secondary | ICD-10-CM | POA: Insufficient documentation

## 2015-02-13 DIAGNOSIS — E119 Type 2 diabetes mellitus without complications: Secondary | ICD-10-CM | POA: Diagnosis not present

## 2015-02-13 DIAGNOSIS — Z7982 Long term (current) use of aspirin: Secondary | ICD-10-CM | POA: Diagnosis not present

## 2015-02-13 DIAGNOSIS — Y9389 Activity, other specified: Secondary | ICD-10-CM | POA: Diagnosis not present

## 2015-02-13 LAB — I-STAT TROPONIN, ED
Troponin i, poc: 0.01 ng/mL (ref 0.00–0.08)
Troponin i, poc: 0.02 ng/mL (ref 0.00–0.08)

## 2015-02-13 LAB — BASIC METABOLIC PANEL
Anion gap: 9 (ref 5–15)
BUN: 24 mg/dL — ABNORMAL HIGH (ref 6–20)
CO2: 27 mmol/L (ref 22–32)
Calcium: 9.5 mg/dL (ref 8.9–10.3)
Chloride: 104 mmol/L (ref 101–111)
Creatinine, Ser: 1.31 mg/dL — ABNORMAL HIGH (ref 0.44–1.00)
GFR calc Af Amer: 47 mL/min — ABNORMAL LOW (ref >60)
GFR calc non Af Amer: 40 mL/min — ABNORMAL LOW (ref >60)
Glucose, Bld: 166 mg/dL — ABNORMAL HIGH (ref 65–99)
Potassium: 4 mmol/L (ref 3.5–5.1)
Sodium: 140 mmol/L (ref 135–145)

## 2015-02-13 LAB — CBC
HCT: 35.7 % — ABNORMAL LOW (ref 36.0–46.0)
Hemoglobin: 10.3 g/dL — ABNORMAL LOW (ref 12.0–15.0)
MCH: 23.3 pg — ABNORMAL LOW (ref 26.0–34.0)
MCHC: 28.9 g/dL — ABNORMAL LOW (ref 30.0–36.0)
MCV: 80.6 fL (ref 78.0–100.0)
Platelets: 214 10*3/uL (ref 150–400)
RBC: 4.43 MIL/uL (ref 3.87–5.11)
RDW: 16.7 % — ABNORMAL HIGH (ref 11.5–15.5)
WBC: 8.4 10*3/uL (ref 4.0–10.5)

## 2015-02-13 NOTE — ED Notes (Signed)
AVS given to PT. PT instructed to return to ED for new or worsening symptoms. PT instructed to read over AVS. PT has an appt with cardiovascular tomorrow. PT instructed to keep appointment.

## 2015-02-13 NOTE — ED Notes (Signed)
PT comes from home for chest pain that became severe today while she was out shopping. PT reports SOB, back pain, and lightheadness with onset of chest pain. PT reports prior to today she has had intermittent chest pain for 3 days and has generally not felt well. PT had a CABG in October. PT came home from rehab in mid November.

## 2015-02-13 NOTE — Discharge Instructions (Signed)
Nonspecific Chest Pain  °Chest pain can be caused by many different conditions. There is always a chance that your pain could be related to something serious, such as a heart attack or a blood clot in your lungs. Chest pain can also be caused by conditions that are not life-threatening. If you have chest pain, it is very important to follow up with your health care provider. °CAUSES  °Chest pain can be caused by: °· Heartburn. °· Pneumonia or bronchitis. °· Anxiety or stress. °· Inflammation around your heart (pericarditis) or lung (pleuritis or pleurisy). °· A blood clot in your lung. °· A collapsed lung (pneumothorax). It can develop suddenly on its own (spontaneous pneumothorax) or from trauma to the chest. °· Shingles infection (varicella-zoster virus). °· Heart attack. °· Damage to the bones, muscles, and cartilage that make up your chest wall. This can include: °¨ Bruised bones due to injury. °¨ Strained muscles or cartilage due to frequent or repeated coughing or overwork. °¨ Fracture to one or more ribs. °¨ Sore cartilage due to inflammation (costochondritis). °RISK FACTORS  °Risk factors for chest pain may include: °· Activities that increase your risk for trauma or injury to your chest. °· Respiratory infections or conditions that cause frequent coughing. °· Medical conditions or overeating that can cause heartburn. °· Heart disease or family history of heart disease. °· Conditions or health behaviors that increase your risk of developing a blood clot. °· Having had chicken pox (varicella zoster). °SIGNS AND SYMPTOMS °Chest pain can feel like: °· Burning or tingling on the surface of your chest or deep in your chest. °· Crushing, pressure, aching, or squeezing pain. °· Dull or sharp pain that is worse when you move, cough, or take a deep breath. °· Pain that is also felt in your back, neck, shoulder, or arm, or pain that spreads to any of these areas. °Your chest pain may come and go, or it may stay  constant. °DIAGNOSIS °Lab tests or other studies may be needed to find the cause of your pain. Your health care provider may have you take a test called an ambulatory ECG (electrocardiogram). An ECG records your heartbeat patterns at the time the test is performed. You may also have other tests, such as: °· Transthoracic echocardiogram (TTE). During echocardiography, sound waves are used to create a picture of all of the heart structures and to look at how blood flows through your heart. °· Transesophageal echocardiogram (TEE). This is a more advanced imaging test that obtains images from inside your body. It allows your health care provider to see your heart in finer detail. °· Cardiac monitoring. This allows your health care provider to monitor your heart rate and rhythm in real time. °· Holter monitor. This is a portable device that records your heartbeat and can help to diagnose abnormal heartbeats. It allows your health care provider to track your heart activity for several days, if needed. °· Stress tests. These can be done through exercise or by taking medicine that makes your heart beat more quickly. °· Blood tests. °· Imaging tests. °TREATMENT  °Your treatment depends on what is causing your chest pain. Treatment may include: °· Medicines. These may include: °¨ Acid blockers for heartburn. °¨ Anti-inflammatory medicine. °¨ Pain medicine for inflammatory conditions. °¨ Antibiotic medicine, if an infection is present. °¨ Medicines to dissolve blood clots. °¨ Medicines to treat coronary artery disease. °· Supportive care for conditions that do not require medicines. This may include: °¨ Resting. °¨ Applying heat   or cold packs to injured areas. °¨ Limiting activities until pain decreases. °HOME CARE INSTRUCTIONS °· If you were prescribed an antibiotic medicine, finish it all even if you start to feel better. °· Avoid any activities that bring on chest pain. °· Do not use any tobacco products, including  cigarettes, chewing tobacco, or electronic cigarettes. If you need help quitting, ask your health care provider. °· Do not drink alcohol. °· Take medicines only as directed by your health care provider. °· Keep all follow-up visits as directed by your health care provider. This is important. This includes any further testing if your chest pain does not go away. °· If heartburn is the cause for your chest pain, you may be told to keep your head raised (elevated) while sleeping. This reduces the chance that acid will go from your stomach into your esophagus. °· Make lifestyle changes as directed by your health care provider. These may include: °¨ Getting regular exercise. Ask your health care provider to suggest some activities that are safe for you. °¨ Eating a heart-healthy diet. A registered dietitian can help you to learn healthy eating options. °¨ Maintaining a healthy weight. °¨ Managing diabetes, if necessary. °¨ Reducing stress. °SEEK MEDICAL CARE IF: °· Your chest pain does not go away after treatment. °· You have a rash with blisters on your chest. °· You have a fever. °SEEK IMMEDIATE MEDICAL CARE IF:  °· Your chest pain is worse. °· You have an increasing cough, or you cough up blood. °· You have severe abdominal pain. °· You have severe weakness. °· You faint. °· You have chills. °· You have sudden, unexplained chest discomfort. °· You have sudden, unexplained discomfort in your arms, back, neck, or jaw. °· You have shortness of breath at any time. °· You suddenly start to sweat, or your skin gets clammy. °· You feel nauseous or you vomit. °· You suddenly feel light-headed or dizzy. °· Your heart begins to beat quickly, or it feels like it is skipping beats. °These symptoms may represent a serious problem that is an emergency. Do not wait to see if the symptoms will go away. Get medical help right away. Call your local emergency services (911 in the [place] Do not drive yourself to the hospital. °  °This  information is not intended to replace advice given to you by your health care provider. Make sure you discuss any questions you have with your health care provider. °  °Document Released: 11/06/2004 Document Revised: 02/17/2014 Document Reviewed: 09/02/2013 °Elsevier Interactive Patient Education ©2016 [employer] ° °

## 2015-02-13 NOTE — ED Provider Notes (Signed)
CSN: [id number]     Arrival date & time 02/13/15  1730 History   First MD Initiated Contact with Patient 02/13/15 1745     Chief Complaint  Patient presents with  . Chest Pain      Patient is a 70 y.o. female presenting with chest pain. The history is provided by the patient.  Chest Pain Associated symptoms: fatigue and shortness of breath   Associated symptoms: no abdominal pain, no back pain, no headache, no nausea, no numbness, not vomiting and no weakness    patient has had shortness of breath and chest pain for last few days. Worse with exertion. Rare cough. Some swelling or legs. States it is not that bad for her. States pain is somewhat sharp in from her left parasternal area to her breast. States it is somewhat similar to pain she had after her recent CABG. No diaphoresis. No nausea. States her weights have been relatively stable at home. Nonproductive cough. She states she has been compliant with her medications. She is not currently hurting.  States she has steadily been doing better since her operation. She is back at home and has also been treated for a cellulitis in her right lower extremity. Patient has an appointment with the CHF clinic tomorrow. Past Medical History  Diagnosis Date  . Asthma   . Hypertension   . Arthritis   . GERD (gastroesophageal reflux disease)   . Diverticulosis   . Anemia   . CAD (coronary artery disease)   . Fatty liver disease, nonalcoholic   . Internal hemorrhoids   . Gastritis   . Hyperplastic colon polyp   . OSA (obstructive sleep apnea)   . ICD (implantable cardiac defibrillator) in place   . Shortness of breath   . IBS (irritable bowel syndrome)   . H/O hiatal hernia   . Obesity   . Diabetes mellitus without complication ([place])   . Cellulitis and abscess of foot 12/2014    RT FOOT   Past Surgical History  Procedure Laterality Date  . Pacemaker insertion  2001    BS BivICD  . Cardiac defibrillator placement    . Appendectomy    .  Knee surgery      right  . Ankle surgery      right  . Tubal ligation    . Cervical spine surgery      plate   . Coronary angiogram  2010  . Abdominal ultrasound  12/01/2011    Peripelvic cysts- #1- 2.4x1.9x2.3cm, #2-1.2x0.9x1.2cm  . Cardiac catheterization  05/17/1999    No significant coronary obstructive disease w/ mild 20% luminal irregularity of the first diag branch of the LAD  . Cardiac catheterization  07/08/2002    No significant CAD, moderately depressed LV systolic function  . Cardiac catheterization Bilateral 04/26/2007    Normal findings, recommend medical therapy  . Cardiac catheterization  02/18/2008    Moderate CAD, would benefit from having a functional study, recommend continue medical therapy  . Cardiac catheterization  07/23/2012    Medical therapy  . Lexiscan myoview  11/14/2011    Mild-moderate defect seen in Mid Inferolateral and Mid Anterolateral regions-consistant w/ infarct/scar. No significant ischemia demonstrated.  . Transthoracic echocardiogram  07/23/2012    EF 55-60%, normal-mild  . Left heart catheterization with coronary angiogram N/A 07/23/2012    Procedure: LEFT HEART CATHETERIZATION WITH CORONARY ANGIOGRAM;  Surgeon: Leonie Man, MD;  Location: [facility] CATH LAB;  Service: Cardiovascular;  Laterality: N/A;  . Biv pacemaker  generator change out N/A 11/02/2012    Procedure: BIV PACEMAKER GENERATOR CHANGE OUT;  Surgeon: Sanda Klein, MD;  Location: [place] CATH LAB;  Service: Cardiovascular;  Laterality: N/A;  . Cardiac catheterization N/A 11/24/2014    Procedure: Left Heart Cath and Coronary Angiography;  Surgeon: Troy Sine, MD;  Location: [place] CV LAB;  Service: Cardiovascular;  Laterality: N/A;  . Cardiac catheterization  11/27/2014    Procedure: Intravascular Pressure Wire/FFR Study;  Surgeon: Peter M Martinique, MD;  Location: [place] CV LAB;  Service: Cardiovascular;;  . Coronary artery bypass graft N/A 11/29/2014    Procedure: CORONARY ARTERY  BYPASS GRAFTING x 5 (LIMA-LAD, SVG-D, SVG-OM1-OM2, SVG-PD);  Surgeon: Melrose Nakayama, MD;  Location: [place];  Service: Open Heart Surgery;  Laterality: N/A;  . Tee without cardioversion N/A 11/29/2014    Procedure: TRANSESOPHAGEAL ECHOCARDIOGRAM (TEE);  Surgeon: Melrose Nakayama, MD;  Location: [place];  Service: Open Heart Surgery;  Laterality: N/A;   Family History  Problem Relation Age of Onset  . Breast cancer Mother   . Diabetes Mother   . Heart disease Maternal Grandmother   . Kidney disease Maternal Grandmother   . Diabetes Maternal Grandmother   . Glaucoma Maternal Aunt   . Heart disease Maternal Aunt   . Asthma Sister    Social History  Substance Use Topics  . Smoking status: Former Smoker -- 0.25 packs/day for 3 years    Types: Cigarettes    Quit date: 02/11/1968  . Smokeless tobacco: Never Used  . Alcohol Use: No   OB History    No data available     Review of Systems  Constitutional: Positive for fatigue. Negative for activity change and appetite change.  Eyes: Negative for pain.  Respiratory: Positive for shortness of breath. Negative for chest tightness.   Cardiovascular: Positive for chest pain and leg swelling.  Gastrointestinal: Negative for nausea, vomiting, abdominal pain and diarrhea.  Genitourinary: Negative for flank pain.  Musculoskeletal: Negative for back pain and neck stiffness.  Skin: Positive for wound. Negative for rash.  Neurological: Negative for weakness, numbness and headaches.  Psychiatric/Behavioral: Negative for behavioral problems.      Allergies  Ivp dye; Shellfish-derived products; Sulfa antibiotics; Iodine; and Zithromax  Home Medications   Prior to Admission medications   Medication Sig Start Date End Date Taking? Authorizing Provider  albuterol (PROVENTIL HFA;VENTOLIN HFA) 108 (90 BASE) MCG/ACT inhaler Inhale 2 puffs into the lungs every 4 (four) hours as needed for wheezing (wheezing).    Yes Historical Provider, MD   aspirin EC 325 MG EC tablet Take 1 tablet (325 mg total) by mouth daily. 12/07/14  Yes Coolidge Breeze, PA-C  carvedilol (COREG) 6.25 MG tablet Take 1 tablet (6.25 mg total) by mouth 2 (two) times daily with a meal. 01/01/15  Yes Wayne E Gold, PA-C  cetirizine (ZYRTEC) 10 MG tablet Take 10 mg by mouth daily.    Yes Historical Provider, MD  clotrimazole (MYCELEX) 10 MG troche Take 10 mg by mouth every 4 (four) hours as needed (mouth pain).   Yes Historical Provider, MD  fluticasone (FLONASE) 50 MCG/ACT nasal spray Place 1 spray into the nose 2 (two) times daily as needed for rhinitis or allergies.    Yes Historical Provider, MD  KLOR-CON M20 20 MEQ tablet TAKE ONE TABLET BY MOUTH ONCE DAILY 01/22/15  Yes [name], MD  omeprazole (PRILOSEC) 40 MG capsule Take 40 mg by mouth daily.  07/19/13  Yes Historical Provider,  MD  pravastatin (PRAVACHOL) 80 MG tablet Take 1 tablet (80 mg total) by mouth every evening. 10/31/14  Yes [name], MD  spironolactone (ALDACTONE) 25 MG tablet Take 25 mg by mouth daily. 11/10/14  Yes Historical Provider, MD  SYMBICORT 160-4.5 MCG/ACT inhaler Inhale 2 puffs into the lungs 2 (two) times daily. 12/02/13  Yes Historical Provider, MD  torsemide (DEMADEX) 20 MG tablet Take 2 tablets (40 mg total) by mouth daily. Patient taking differently: Take 20 mg by mouth 2 (two) times daily.  01/01/15  Yes Wayne E Gold, PA-C  triamcinolone cream (KENALOG) 0.1 % Apply 1 application topically daily as needed (affected area).  08/24/14  Yes Historical Provider, MD  dicyclomine (BENTYL) 20 MG tablet Take 1 tablet (20 mg total) by mouth 2 (two) times daily as needed (pain). Patient not taking: Reported on 02/08/2015 07/12/14   Willia Craze, NP   BP 128/78 mmHg  Pulse 93  Temp(Src) 98.3 F (36.8 C) (Oral)  Resp 22  Ht 5\' 5"  (1.651 m)  Wt 237 lb (107.502 kg)  BMI 39.44 kg/m2  SpO2 99% Physical Exam  Constitutional: She appears well-developed.   Patient is obese  HENT:   Head: Normocephalic.  Eyes: EOM are normal.  Neck: No JVD present.  Cardiovascular: Normal rate and regular rhythm.   Pulmonary/Chest:   Few scattered rales at the bases. Well-healing scar from previous median sternotomy. Some sternal tenderness, but states it is not the same pain that she has been having.  Abdominal: Soft. There is no tenderness.  Musculoskeletal: She exhibits edema.   Moderate edema to bilateral lower legs, somewhat worse on right.  Neurological: She is alert.  Skin: Skin is warm. No erythema.   Well-healing wound to posterior right lower leg    ED Course  Procedures (including critical care time) Labs Review Labs Reviewed  BASIC METABOLIC PANEL - Abnormal; Notable for the following:    Glucose, Bld 166 (*)    BUN 24 (*)    Creatinine, Ser 1.31 (*)    GFR calc non Af Amer 40 (*)    GFR calc Af Amer 47 (*)    All other components within normal limits  CBC - Abnormal; Notable for the following:    Hemoglobin 10.3 (*)    HCT 35.7 (*)    MCH 23.3 (*)    MCHC 28.9 (*)    RDW 16.7 (*)    All other components within normal limits  I-STAT TROPOININ, ED  Randolm Idol, ED    Imaging Review Dg Chest 2 View  02/13/2015  CLINICAL DATA:  Chest pain, shortness of breath EXAM: CHEST  2 VIEW COMPARISON:  01/09/2015 FINDINGS: Cardiomediastinal silhouette is stable. Status post median sternotomy. Four leads cardiac pacemaker is unchanged in position. No acute infiltrate or pleural effusion. No pulmonary edema. Osteopenia and mild degenerative changes thoracic spine. IMPRESSION: No active cardiopulmonary disease. Stable 4 leads cardiac pacemaker position. Electronically Signed   By: Lahoma Crocker M.D.   On: 02/13/2015 18:48   I have personally reviewed and evaluated these images and lab results as part of my medical decision-making.   EKG Interpretation   Date/Time:  Tuesday February 13 2015 17:37:39 EST Ventricular Rate:  99 PR Interval:  158 QRS Duration: 146 QT  Interval:  400 QTC Calculation: 513 R Axis:   -121 Text Interpretation:  Ventricular-paced rhythm No further analysis  attempted due to paced rhythm Baseline wander in lead(s) I II aVR  Confirmed by [facility]  MD, Ovid Curd [phone]) on 02/13/2015 6:11:57 PM      MDM   Final diagnoses:  Chest pain, unspecified chest pain type     patient with chest pain. Paced. Has follow-up with cardiology tomorrow. Discussed with Dr. Algernon Huxley. Enzymes negative 2.    Davonna Belling, MD 02/13/15 [phone]

## 2015-02-14 ENCOUNTER — Ambulatory Visit (HOSPITAL_COMMUNITY)
Admission: RE | Admit: 2015-02-14 | Discharge: 2015-02-14 | Disposition: A | Discharge status: Home / Self Care | Payer: Medicare Other | Source: Ambulatory Visit | Attending: Internal Medicine | Admitting: Internal Medicine

## 2015-02-14 ENCOUNTER — Encounter (HOSPITAL_COMMUNITY): Payer: Self-pay | Admitting: Internal Medicine

## 2015-02-14 VITALS — BP 118/74 | HR 87 | Wt 242.5 lb

## 2015-02-14 DIAGNOSIS — E119 Type 2 diabetes mellitus without complications: Secondary | ICD-10-CM | POA: Insufficient documentation

## 2015-02-14 DIAGNOSIS — R5383 Other fatigue: Secondary | ICD-10-CM | POA: Insufficient documentation

## 2015-02-14 DIAGNOSIS — Z7982 Long term (current) use of aspirin: Secondary | ICD-10-CM | POA: Diagnosis not present

## 2015-02-14 DIAGNOSIS — K219 Gastro-esophageal reflux disease without esophagitis: Secondary | ICD-10-CM | POA: Diagnosis not present

## 2015-02-14 DIAGNOSIS — J45909 Unspecified asthma, uncomplicated: Secondary | ICD-10-CM | POA: Diagnosis not present

## 2015-02-14 DIAGNOSIS — I129 Hypertensive chronic kidney disease with stage 1 through stage 4 chronic kidney disease, or unspecified chronic kidney disease: Secondary | ICD-10-CM | POA: Diagnosis not present

## 2015-02-14 DIAGNOSIS — I5032 Chronic diastolic (congestive) heart failure: Secondary | ICD-10-CM | POA: Diagnosis not present

## 2015-02-14 DIAGNOSIS — G4733 Obstructive sleep apnea (adult) (pediatric): Secondary | ICD-10-CM | POA: Diagnosis not present

## 2015-02-14 DIAGNOSIS — Z95810 Presence of automatic (implantable) cardiac defibrillator: Secondary | ICD-10-CM | POA: Insufficient documentation

## 2015-02-14 DIAGNOSIS — N183 Chronic kidney disease, stage 3 (moderate): Secondary | ICD-10-CM | POA: Insufficient documentation

## 2015-02-14 DIAGNOSIS — I2510 Atherosclerotic heart disease of native coronary artery without angina pectoris: Secondary | ICD-10-CM | POA: Insufficient documentation

## 2015-02-14 DIAGNOSIS — Z87891 Personal history of nicotine dependence: Secondary | ICD-10-CM | POA: Insufficient documentation

## 2015-02-14 DIAGNOSIS — Z951 Presence of aortocoronary bypass graft: Secondary | ICD-10-CM | POA: Insufficient documentation

## 2015-02-14 NOTE — Progress Notes (Signed)
ADVANCED HF CLINIC NOTE  Patient ID: [name], female   DOB: 02-Jan-1946, 70 y.o.   MRN: [id number]  Primary Cardiologist: Dr. Dani Gobble Croitoru Primary HF: Dr. Haroldine Laws   HPI: [name] is a 70 year old with history of morbid obesity, DM, HTN, asthma, OSA, ICD, GERD, arthritis, CRT-D 2008 [employer], CAD, S/P CABG x5 11/29/2014   Discharged from [facility] 12/07/14 s/p CABG. Discharge weight 252.  Went to [facility].  Readmitted 12/21/14 with RLE cellulitis and volume overload with weight of 273 and complaints of dyspnea and orthopnea. RLE cellulitis treated with IV ABX. She was had brisk diuresis with lasix gtt. Diuresis slowed down with slight Cr bump. Acetazolamide added and monitored closely with history of sulfa allergy. Diuresed well, but lasix held with continued Cr bump. Transitioned to po torsemide for d/c. Discharge weight 246 lbs.  Here for HF f/u. Had been feeling very well with much less SOB. However over past 2-3 days much more SOB and fatigued. Denies swelling. Weight stable at 237. Last night was walking up steps and felt like she was going to pass out. Went to ER last night. Troponin normal x2. ECG with NSR with v-paced rhythm. CXR ok. No fevers, chills or diarrhea.   Echo 11/24/14 EF 55-60%, Grade 1 DD. Mild TR TEE 11/29/14 EF 55-60%, LVH, Mild AR and MR  Past Medical History  Diagnosis Date  . Asthma   . Hypertension   . Arthritis   . GERD (gastroesophageal reflux disease)   . Diverticulosis   . Anemia   . CAD (coronary artery disease)   . Fatty liver disease, nonalcoholic   . Internal hemorrhoids   . Gastritis   . Hyperplastic colon polyp   . OSA (obstructive sleep apnea)   . ICD (implantable cardiac defibrillator) in place   . Shortness of breath   . IBS (irritable bowel syndrome)   . H/O hiatal hernia   . Obesity   . Diabetes mellitus without complication ([place])   . Cellulitis and abscess of foot 12/2014    RT FOOT    Current Outpatient  Prescriptions  Medication Sig Dispense Refill  . albuterol (PROVENTIL HFA;VENTOLIN HFA) 108 (90 BASE) MCG/ACT inhaler Inhale 2 puffs into the lungs every 4 (four) hours as needed for wheezing (wheezing).     [name] aspirin EC 325 MG EC tablet Take 1 tablet (325 mg total) by mouth daily. 30 tablet 0  . carvedilol (COREG) 6.25 MG tablet Take 1 tablet (6.25 mg total) by mouth 2 (two) times daily with a meal. 60 tablet 1  . cetirizine (ZYRTEC) 10 MG tablet Take 10 mg by mouth daily.     . clotrimazole (MYCELEX) 10 MG troche Take 10 mg by mouth every 4 (four) hours as needed (mouth pain).    . fluticasone (FLONASE) 50 MCG/ACT nasal spray Place 1 spray into the nose 2 (two) times daily as needed for rhinitis or allergies.     [name] KLOR-CON M20 20 MEQ tablet TAKE ONE TABLET BY MOUTH ONCE DAILY 60 tablet 0  . omeprazole (PRILOSEC) 40 MG capsule Take 40 mg by mouth daily.     . pravastatin (PRAVACHOL) 80 MG tablet Take 1 tablet (80 mg total) by mouth every evening. 90 tablet 2  . spironolactone (ALDACTONE) 25 MG tablet Take 25 mg by mouth daily.    . SYMBICORT 160-4.5 MCG/ACT inhaler Inhale 2 puffs into the lungs 2 (two) times daily.    [name] torsemide (DEMADEX) 20 MG tablet  Take 20 mg by mouth 2 (two) times daily.    [name] triamcinolone cream (KENALOG) 0.1 % Apply 1 application topically daily as needed (affected area).     . dicyclomine (BENTYL) 20 MG tablet Take 1 tablet (20 mg total) by mouth 2 (two) times daily as needed (pain). (Patient not taking: Reported on 02/08/2015) 60 tablet 0   No current facility-administered medications for this encounter.    Allergies  Allergen Reactions  . Ivp Dye [Iodinated Diagnostic Agents] Shortness Of Breath    No reaction to PO contrast with non-ionic dye.06-25-2014/rsm  . Shellfish-Derived Products Anaphylaxis  . Sulfa Antibiotics Shortness Of Breath  . Iodine Hives  . Zithromax [Azithromycin] Rash      Social History   Social History  . Marital Status: Widowed     Spouse Name: N/A  . Number of Children: 5  . Years of Education: N/A   Occupational History  . 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 Smoking status: Former Smoker -- 0.25 packs/day for 3 years    Types: Cigarettes    Quit date: 02/11/1968  . Smokeless tobacco: Never Used  . Alcohol Use: No  . Drug Use: No  . Sexual Activity: No   Other Topics Concern  . Not on file   Social History Narrative   Widowed last year. She lives with her two sons.      Family History  Problem Relation Age of Onset  . Breast cancer Mother   . Diabetes Mother   . Heart disease Maternal Grandmother   . Kidney disease Maternal Grandmother   . Diabetes Maternal Grandmother   . Glaucoma Maternal Aunt   . Heart disease Maternal Aunt   . Asthma Sister     Danley Danker Vitals:   02/14/15 1457  BP: 118/74  Pulse: 87  Weight: 242 lb 8 oz (109.997 kg)  SpO2: 98%    PHYSICAL EXAM: General:  Fatigued appearing. NAD HEENT: normal Neck: supple. no JVD. Carotids 2+ bilat; no bruits. No thyromegaly or nodule noted. Cor: PMI nondisplaced. RRR. No M/G/R. Sternum stable. Mild soreness to palpation Lungs: Diminished throughout but clear Abdomen: Obese, soft, NT, ND. No HSM. No bruits or masses. +BS Extremities: no cyanosis or clubbing. RLE two small sores with minimal surrounding erthema. No edema Neuro: alert & oriented x 3, cranial nerves grossly intact. moves all 4 extremities w/o difficulty. Affect pleasant.   ASSESSMENT & PLAN:  1. Chronic diastolic HF - TEE 99991111 EF 55-60%, mild LVH.  2. CAD s/p CABG 11/2014 3. Morbid obesity 4. H/o heart block s/p BiV pacer/CRT-D [employer] - with malfunction of A lead and chronic penetration of V lead into pericardium. EP followed in hospital at that time recommended no further intervention.  6. Chronic renal failure stage III 7. Cellulitis, lower extremity 11/16 - now resolved 8. Fatigue  Overall she seems to be  improving but had a set back over past few days. I have reviewed all labs, ECG and CXR with her from ER and all appear within normal limits. Volume status is stable. No clinical signs of infection. At this point I am unsure why she is acutely not feeling well. Given her dizziness perhaps she is slightly dry. Will hold torsemide for 2 doses and see if that helps. Will follow conservatively. She is to call next week if not improving  Karthika Glasper,MD 3:43 PM

## 2015-02-14 NOTE — Patient Instructions (Signed)
Hold Torsemide tomorrow and Friday.  Follow up 2 months.  Do the following things EVERYDAY: 1) Weigh yourself in the morning before breakfast. Write it down and keep it in a log. 2) Take your medicines as prescribed 3) Eat low salt foods-Limit salt (sodium) to 2000 mg per day.  4) Stay as active as you can everyday 5) Limit all fluids for the day to less than 2 liters

## 2015-02-19 ENCOUNTER — Telehealth (HOSPITAL_COMMUNITY): Payer: Self-pay

## 2015-02-19 NOTE — Telephone Encounter (Signed)
Patient reports to CHF triage line that she continues to feel weka, SOB, and dizzy after recent OV with Dr. Haroldine Laws.  All systems checked out WNL at that time.  Dr. Haroldine Laws recommends lab work including CBC, CMET, UA, thyroid panel.  Patient aware and agreeable to plan.  Renee Pain

## 2015-02-21 ENCOUNTER — Ambulatory Visit (HOSPITAL_COMMUNITY)
Admission: RE | Admit: 2015-02-21 | Discharge: 2015-02-21 | Disposition: A | Discharge status: Home / Self Care | Payer: Medicare Other | Source: Ambulatory Visit | Attending: Internal Medicine | Admitting: Internal Medicine

## 2015-02-21 DIAGNOSIS — I5032 Chronic diastolic (congestive) heart failure: Secondary | ICD-10-CM | POA: Insufficient documentation

## 2015-02-21 DIAGNOSIS — R5383 Other fatigue: Secondary | ICD-10-CM | POA: Diagnosis not present

## 2015-02-21 LAB — URINALYSIS, ROUTINE W REFLEX MICROSCOPIC
Bilirubin Urine: NEGATIVE
Glucose, UA: NEGATIVE mg/dL
Hgb urine dipstick: NEGATIVE
Ketones, ur: NEGATIVE mg/dL
Nitrite: NEGATIVE
Protein, ur: NEGATIVE mg/dL
Specific Gravity, Urine: 1.013 (ref 1.005–1.030)
pH: 5 (ref 5.0–8.0)

## 2015-02-21 LAB — URINE MICROSCOPIC-ADD ON

## 2015-02-21 LAB — CBC
HCT: 37.2 % (ref 36.0–46.0)
Hemoglobin: 10.7 g/dL — ABNORMAL LOW (ref 12.0–15.0)
MCH: 23.1 pg — ABNORMAL LOW (ref 26.0–34.0)
MCHC: 28.8 g/dL — ABNORMAL LOW (ref 30.0–36.0)
MCV: 80.2 fL (ref 78.0–100.0)
Platelets: 311 10*3/uL (ref 150–400)
RBC: 4.64 MIL/uL (ref 3.87–5.11)
RDW: 17.2 % — ABNORMAL HIGH (ref 11.5–15.5)
WBC: 8.6 10*3/uL (ref 4.0–10.5)

## 2015-02-21 LAB — COMPREHENSIVE METABOLIC PANEL
ALT: 18 U/L (ref 14–54)
AST: 18 U/L (ref 15–41)
Albumin: 3.1 g/dL — ABNORMAL LOW (ref 3.5–5.0)
Alkaline Phosphatase: 99 U/L (ref 38–126)
Anion gap: 8 (ref 5–15)
BUN: 16 mg/dL (ref 6–20)
CO2: 26 mmol/L (ref 22–32)
Calcium: 9.4 mg/dL (ref 8.9–10.3)
Chloride: 103 mmol/L (ref 101–111)
Creatinine, Ser: 1.39 mg/dL — ABNORMAL HIGH (ref 0.44–1.00)
GFR calc Af Amer: 43 mL/min — ABNORMAL LOW (ref >60)
GFR calc non Af Amer: 37 mL/min — ABNORMAL LOW (ref >60)
Glucose, Bld: 113 mg/dL — ABNORMAL HIGH (ref 65–99)
Potassium: 4.5 mmol/L (ref 3.5–5.1)
Sodium: 137 mmol/L (ref 135–145)
Total Bilirubin: 0.5 mg/dL (ref 0.3–1.2)
Total Protein: 6.9 g/dL (ref 6.5–8.1)

## 2015-02-21 LAB — T4, FREE: Free T4: 1 ng/dL (ref 0.61–1.12)

## 2015-02-21 LAB — TSH: TSH: 3.677 u[IU]/mL (ref 0.350–4.500)

## 2015-02-21 NOTE — Addendum Note (Signed)
Encounter addended by: Effie Berkshire, RN on: 02/21/2015 10:58 AM<BR>     Documentation filed: [employer], Orders

## 2015-02-22 LAB — T3, FREE: T3, Free: 3.4 pg/mL (ref 2.0–4.4)

## 2015-03-15 ENCOUNTER — Other Ambulatory Visit: Payer: Self-pay | Admitting: Surgical

## 2015-03-19 ENCOUNTER — Other Ambulatory Visit (HOSPITAL_COMMUNITY): Payer: Self-pay | Admitting: *Deleted

## 2015-03-19 MED ORDER — TORSEMIDE 20 MG PO TABS: 20.0000 mg | ORAL_TABLET | Freq: Two times a day (BID) | ORAL | Status: DC

## 2015-03-19 MED ORDER — CARVEDILOL 6.25 MG PO TABS: 6.2500 mg | ORAL_TABLET | Freq: Two times a day (BID) | ORAL | Status: DC

## 2015-03-30 ENCOUNTER — Ambulatory Visit (INDEPENDENT_AMBULATORY_CARE_PROVIDER_SITE_OTHER): Payer: Medicare Other | Admitting: Gastroenterology

## 2015-03-30 ENCOUNTER — Encounter: Payer: Self-pay | Admitting: Gastroenterology

## 2015-03-30 VITALS — BP 112/66 | HR 86 | Ht 65.0 in | Wt 246.0 lb

## 2015-03-30 DIAGNOSIS — K219 Gastro-esophageal reflux disease without esophagitis: Secondary | ICD-10-CM

## 2015-03-30 DIAGNOSIS — K589 Irritable bowel syndrome without diarrhea: Secondary | ICD-10-CM

## 2015-03-30 DIAGNOSIS — K5900 Constipation, unspecified: Secondary | ICD-10-CM

## 2015-03-30 MED ORDER — OMEPRAZOLE 40 MG PO CPDR: 40.0000 mg | DELAYED_RELEASE_CAPSULE | Freq: Every day | ORAL | Status: DC

## 2015-03-30 NOTE — Progress Notes (Signed)
[name]    [id number]    05/30/1945  Primary Care Physician:GATES,DONNA RUTH, MD  Referring Physician: Darcus Austin, MD [address] [place], [place] [address]  Chief complaint:  Nausea  HPI: 70 year old female, former patient of Dr. Olevia Perches followed for history of irritable bowel syndrome, upper abdominal discomfort, early satiety, and bloating here to establish care with me. She underwent CABGx5 in October 2016, his multiple medical comorbidities. Reported worsening of nausea and abdominal discomfort around the time of her surgery and an in the postoperative period, her nausea and abdominal discomfort have been slowly improving over the last 2 months. She no longer has any specific GI symptoms, Denies any nausea, vomiting, abdominal pain, melena or bright red blood per rectum Her last colonoscopy was in 2004 and is overdue for screening colonoscopy.  Outpatient Encounter Prescriptions as of 03/30/2015  Medication Sig  . albuterol (PROVENTIL HFA;VENTOLIN HFA) 108 (90 BASE) MCG/ACT inhaler Inhale 2 puffs into the lungs every 4 (four) hours as needed for wheezing (wheezing).   [name] aspirin EC 325 MG EC tablet Take 1 tablet (325 mg total) by mouth daily.  . carvedilol (COREG) 6.25 MG tablet Take 1 tablet (6.25 mg total) by mouth 2 (two) times daily with a meal.  . cetirizine (ZYRTEC) 10 MG tablet Take 10 mg by mouth daily.   . clotrimazole (MYCELEX) 10 MG troche Take 10 mg by mouth every 4 (four) hours as needed (mouth pain).  . fluticasone (FLONASE) 50 MCG/ACT nasal spray Place 1 spray into the nose 2 (two) times daily as needed for rhinitis or allergies.   [name] KLOR-CON M20 20 MEQ tablet TAKE ONE TABLET BY MOUTH ONCE DAILY  . omeprazole (PRILOSEC) 40 MG capsule Take 40 mg by mouth daily.   . pravastatin (PRAVACHOL) 80 MG tablet Take 1 tablet (80 mg total) by mouth every evening.  [name] spironolactone (ALDACTONE) 25 MG tablet Take 25 mg by mouth daily.  .  SYMBICORT 160-4.5 MCG/ACT inhaler Inhale 2 puffs into the lungs 2 (two) times daily.  [name] torsemide (DEMADEX) 20 MG tablet Take 1 tablet (20 mg total) by mouth 2 (two) times daily. (Patient taking differently: Take 40 mg by mouth 2 (two) times daily. )  . triamcinolone cream (KENALOG) 0.1 % Apply 1 application topically daily as needed (affected area).   . [DISCONTINUED] dicyclomine (BENTYL) 20 MG tablet Take 1 tablet (20 mg total) by mouth 2 (two) times daily as needed (pain). (Patient not taking: Reported on 02/08/2015)   No facility-administered encounter medications on file as of 03/30/2015.    Allergies as of 03/30/2015 - Review Complete 03/30/2015  Allergen Reaction Noted  . Ivp dye [iodinated diagnostic agents] Shortness Of Breath 10/13/2010  . Shellfish-derived products Anaphylaxis 10/13/2010  . Sulfa antibiotics Shortness Of Breath   . Iodine Hives 10/13/2010  . Zithromax [azithromycin] Rash 06/25/2014    Past Medical History  Diagnosis Date  . Asthma   . Hypertension   . Arthritis   . GERD (gastroesophageal reflux disease)   . Diverticulosis   . Anemia   . CAD (coronary artery disease)   . Fatty liver disease, nonalcoholic   . Internal hemorrhoids   . Gastritis   . Hyperplastic colon polyp   . OSA (obstructive sleep apnea)   . ICD (implantable cardiac defibrillator) in place   . Shortness of breath   . IBS (irritable bowel syndrome)   . H/O hiatal hernia   .  Obesity   . Diabetes mellitus without complication ([place])   . Cellulitis and abscess of foot 12/2014    RT FOOT    Past Surgical History  Procedure Laterality Date  . Pacemaker insertion  2001    BS BivICD  . Cardiac defibrillator placement    . Appendectomy    . Knee surgery      right  . Ankle surgery      right  . Tubal ligation    . Cervical spine surgery      plate   . Coronary angiogram  2010  . Abdominal ultrasound  12/01/2011    Peripelvic cysts- #1- 2.4x1.9x2.3cm, #2-1.2x0.9x1.2cm  . Cardiac  catheterization  05/17/1999    No significant coronary obstructive disease w/ mild 20% luminal irregularity of the first diag branch of the LAD  . Cardiac catheterization  07/08/2002    No significant CAD, moderately depressed LV systolic function  . Cardiac catheterization Bilateral 04/26/2007    Normal findings, recommend medical therapy  . Cardiac catheterization  02/18/2008    Moderate CAD, would benefit from having a functional study, recommend continue medical therapy  . Cardiac catheterization  07/23/2012    Medical therapy  . Lexiscan myoview  11/14/2011    Mild-moderate defect seen in Mid Inferolateral and Mid Anterolateral regions-consistant w/ infarct/scar. No significant ischemia demonstrated.  . Transthoracic echocardiogram  07/23/2012    EF 55-60%, normal-mild  . Left heart catheterization with coronary angiogram N/A 07/23/2012    Procedure: LEFT HEART CATHETERIZATION WITH CORONARY ANGIOGRAM;  Surgeon: Leonie Man, MD;  Location: [facility] CATH LAB;  Service: Cardiovascular;  Laterality: N/A;  . Biv pacemaker generator change out N/A 11/02/2012    Procedure: BIV PACEMAKER GENERATOR CHANGE OUT;  Surgeon: Sanda Klein, MD;  Location: [facility] CATH LAB;  Service: Cardiovascular;  Laterality: N/A;  . Cardiac catheterization N/A 11/24/2014    Procedure: Left Heart Cath and Coronary Angiography;  Surgeon: Troy Sine, MD;  Location: [facility] CV LAB;  Service: Cardiovascular;  Laterality: N/A;  . Cardiac catheterization  11/27/2014    Procedure: Intravascular Pressure Wire/FFR Study;  Surgeon: Peter M Martinique, MD;  Location: [facility] CV LAB;  Service: Cardiovascular;;  . Coronary artery bypass graft N/A 11/29/2014    Procedure: CORONARY ARTERY BYPASS GRAFTING x 5 (LIMA-LAD, SVG-D, SVG-OM1-OM2, SVG-PD);  Surgeon: Melrose Nakayama, MD;  Location: [facility];  Service: Open Heart Surgery;  Laterality: N/A;  . Tee without cardioversion N/A 11/29/2014    Procedure: TRANSESOPHAGEAL ECHOCARDIOGRAM  (TEE);  Surgeon: Melrose Nakayama, MD;  Location: [facility];  Service: Open Heart Surgery;  Laterality: N/A;    Family History  Problem Relation Age of Onset  . Breast cancer Mother   . Diabetes Mother   . Heart disease Maternal Grandmother   . Kidney disease Maternal Grandmother   . Diabetes Maternal Grandmother   . Glaucoma Maternal Aunt   . Heart disease Maternal Aunt   . Asthma Sister     Social History   Social History  . Marital Status: Widowed    Spouse Name: N/A  . Number of Children: 5  . Years of Education: N/A   Occupational History  . 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 Smoking status: Former Smoker -- 0.25 packs/day for 3 years    Types: Cigarettes    Quit date: 02/11/1968  . Smokeless tobacco: Never Used  . Alcohol Use: No  . Drug Use: No  . Sexual  Activity: No   Other Topics Concern  . Not on file   Social History Narrative   Widowed last year. She lives with her two sons.      Review of systems: Review of Systems  Constitutional: Negative for fever and chills.  HENT: Negative.   Eyes: Negative for blurred vision.  Respiratory: Negative for cough, shortness of breath and wheezing.   Cardiovascular: Negative for chest pain and palpitations.  positive for shortness of breath Gastrointestinal: as per HPI Genitourinary: Negative for dysuria, urgency, frequency and hematuria.  Musculoskeletal: Negative for myalgias, back pain and joint pain.  Skin: Negative for itching and rash.  positive for leg swelling Neurological: Negative for dizziness, tremors, focal weakness, seizures and loss of consciousness.  Endo/Heme/Allergies: Negative for environmental allergies.  Psychiatric/Behavioral: Negative for depression, suicidal ideas and hallucinations.  All other systems reviewed and are negative.   Physical Exam: Filed Vitals:   03/30/15 1515  BP: 112/66  Pulse: 86   Gen:      No acute distress, obese HEENT:   EOMI, sclera anicteric Neck:     No masses; no thyromegaly Lungs:    Clear to auscultation bilaterally; normal respiratory effort CV:         Regular rate and rhythm; no murmurs Abd:      + bowel sounds; soft, non-tender; no palpable masses, no distension Ext:    1+ edema; adequate peripheral perfusion Skin:      Warm and dry; no rash Neuro: alert and oriented x 3 Psych: normal mood and affect  Data Reviewed:  EGD 12/2006 Antral erosions with gastritis  Colonoscopy 09/2002 Diminutive colon polyps, diverticulosis  CT abd & pelvis 06/2014 Diverticulosis without diverticulitis. No small bowel obstruction.  Findings suggesting bowel wall thickening at the region of the pylorus and first portion the duodenum. This is nonspecific. Suggest further evaluation with upper GI series on outpatient basis.  Assessment and Plan/Recommendations: 70 year old female with history of irritable bowel syndrome and chronic nausea here for follow-up visit. Patient reports improvement of nausea and abdominal discomfort which probably were related to her chronic diastolic heart failure since her cardiac symptoms have improved. Continue omeprazole once a day, along with antireflux measures She is due for screening colonoscopy but given her multiple comorbidities and her inability to go through the bowel prep, will do Colo gaurd instead Half capful MiraLAX daily and titrate as needed based on her symptoms for constipation Return as needed  K. Denzil Magnuson , MD [phone] Mon-Fri 8a-5p [phone] after 5p, weekends, holidays

## 2015-03-30 NOTE — Patient Instructions (Addendum)
We will refill your prilosec today  We have sent your demographic and insurance information to [employer]. They should contact you within the next week regarding your Cologuard (colon cancer screening) test. If you have not heard from them within the next week, please call our office at [phone].  Use Miralax 1/2 capful daily   Follow up as needed

## 2015-04-05 ENCOUNTER — Other Ambulatory Visit: Payer: Self-pay | Admitting: Cardiovascular Disease

## 2015-04-05 NOTE — Telephone Encounter (Signed)
Rx(s) sent to pharmacy electronically.  

## 2015-04-09 ENCOUNTER — Telehealth (HOSPITAL_COMMUNITY): Payer: Self-pay | Admitting: *Deleted

## 2015-04-09 NOTE — Telephone Encounter (Signed)
Pt called concerned about swelling.  She states Sat 2/18 she fell and sprained her ankle pretty bad and it has been swollen since then.  However she is now starting to have swelling up her leg and her wt is up about 10 lbs.  She denies SOB, states leg is not red or hot to touch.  She states she has only been taking Tor 20 mg daily instead of BID as ordered b/c she is having trouble getting to restroom with her ankle.  Advised to keep leg elevated and try taking Tor 40 mg daily, she is agreeable to this and will let us know wt does not come down.  She also is aware if dev redness or hot to touch in leg to report to ER for eval.

## 2015-04-17 ENCOUNTER — Telehealth (HOSPITAL_COMMUNITY): Payer: Self-pay | Admitting: Cardiac Rehabilitation

## 2015-04-17 ENCOUNTER — Telehealth (HOSPITAL_COMMUNITY): Payer: Self-pay | Admitting: Vascular Surgery

## 2015-04-17 NOTE — Telephone Encounter (Signed)
Pt left message.[name] She would like to be seen ,she states her leg is not getting any better

## 2015-04-17 NOTE — Telephone Encounter (Signed)
pc to pt to discuss cardiac rehab orientation appt for 04/19/15.  Pt reports she has increased lower extremity edema associated with redness and warmth.  Pt has notified heart failure clinic and is awaiting return phone call.  Pt advised to present to ED if symptoms persist or worsen. Pt reports she will not be able to come until later this afternoon when she has transportation. Pt appt for cardiac rehab cancelled at this time until swelling evaluated and resolved. Pt verbalized understanding.

## 2015-04-17 NOTE — Telephone Encounter (Signed)
Spoke with patient she told me her ankle was swollen from a  Fall and she had seen an orthopedic.  She said she was advised to take two "fluid" pills but now her leg is swollen and red and hurting. She would like to be seen for an appointment but Dr.Bensimhon does not have any appointments the rest of this month. Please advise

## 2015-04-19 ENCOUNTER — Inpatient Hospital Stay (HOSPITAL_COMMUNITY): Admission: RE | Admit: 2015-04-19 | Payer: Medicare Other | Source: Ambulatory Visit

## 2015-04-23 ENCOUNTER — Ambulatory Visit (HOSPITAL_COMMUNITY): Payer: Medicare Other

## 2015-04-25 ENCOUNTER — Ambulatory Visit (HOSPITAL_COMMUNITY): Payer: Medicare Other

## 2015-04-25 ENCOUNTER — Ambulatory Visit (HOSPITAL_COMMUNITY)
Admission: RE | Admit: 2015-04-25 | Discharge: 2015-04-25 | Disposition: A | Discharge status: Home / Self Care | Payer: Medicare Other | Source: Ambulatory Visit | Attending: Internal Medicine | Admitting: Internal Medicine

## 2015-04-25 DIAGNOSIS — Z79899 Other long term (current) drug therapy: Secondary | ICD-10-CM | POA: Diagnosis not present

## 2015-04-25 DIAGNOSIS — N183 Chronic kidney disease, stage 3 (moderate): Secondary | ICD-10-CM | POA: Insufficient documentation

## 2015-04-25 DIAGNOSIS — K219 Gastro-esophageal reflux disease without esophagitis: Secondary | ICD-10-CM | POA: Diagnosis not present

## 2015-04-25 DIAGNOSIS — J45909 Unspecified asthma, uncomplicated: Secondary | ICD-10-CM | POA: Diagnosis not present

## 2015-04-25 DIAGNOSIS — K589 Irritable bowel syndrome without diarrhea: Secondary | ICD-10-CM | POA: Insufficient documentation

## 2015-04-25 DIAGNOSIS — Z91041 Radiographic dye allergy status: Secondary | ICD-10-CM | POA: Diagnosis not present

## 2015-04-25 DIAGNOSIS — Z881 Allergy status to other antibiotic agents status: Secondary | ICD-10-CM | POA: Diagnosis not present

## 2015-04-25 DIAGNOSIS — Z825 Family history of asthma and other chronic lower respiratory diseases: Secondary | ICD-10-CM | POA: Insufficient documentation

## 2015-04-25 DIAGNOSIS — L03116 Cellulitis of left lower limb: Secondary | ICD-10-CM | POA: Insufficient documentation

## 2015-04-25 DIAGNOSIS — Z87891 Personal history of nicotine dependence: Secondary | ICD-10-CM | POA: Insufficient documentation

## 2015-04-25 DIAGNOSIS — I130 Hypertensive heart and chronic kidney disease with heart failure and stage 1 through stage 4 chronic kidney disease, or unspecified chronic kidney disease: Secondary | ICD-10-CM | POA: Diagnosis not present

## 2015-04-25 DIAGNOSIS — G4733 Obstructive sleep apnea (adult) (pediatric): Secondary | ICD-10-CM | POA: Diagnosis not present

## 2015-04-25 DIAGNOSIS — I2510 Atherosclerotic heart disease of native coronary artery without angina pectoris: Secondary | ICD-10-CM | POA: Diagnosis not present

## 2015-04-25 DIAGNOSIS — Z833 Family history of diabetes mellitus: Secondary | ICD-10-CM | POA: Diagnosis not present

## 2015-04-25 DIAGNOSIS — Z95810 Presence of automatic (implantable) cardiac defibrillator: Secondary | ICD-10-CM | POA: Diagnosis not present

## 2015-04-25 DIAGNOSIS — Z882 Allergy status to sulfonamides status: Secondary | ICD-10-CM | POA: Insufficient documentation

## 2015-04-25 DIAGNOSIS — I5032 Chronic diastolic (congestive) heart failure: Secondary | ICD-10-CM | POA: Insufficient documentation

## 2015-04-25 DIAGNOSIS — Z7982 Long term (current) use of aspirin: Secondary | ICD-10-CM | POA: Diagnosis not present

## 2015-04-25 DIAGNOSIS — Z951 Presence of aortocoronary bypass graft: Secondary | ICD-10-CM | POA: Diagnosis not present

## 2015-04-25 DIAGNOSIS — Z803 Family history of malignant neoplasm of breast: Secondary | ICD-10-CM | POA: Diagnosis not present

## 2015-04-25 DIAGNOSIS — E1122 Type 2 diabetes mellitus with diabetic chronic kidney disease: Secondary | ICD-10-CM | POA: Insufficient documentation

## 2015-04-25 NOTE — Progress Notes (Signed)
Advanced Heart Failure Medication Review by a Pharmacist  Does the patient  feel that his/her medications are working for him/her?  yes  Has the patient been experiencing any side effects to the medications prescribed?  no  Does the patient measure his/her own blood pressure or blood glucose at home?  yes   Does the patient have any problems obtaining medications due to transportation or finances?   no  Understanding of regimen: good Understanding of indications: good Potential of compliance: good Patient understands to avoid NSAIDs. Patient understands to avoid decongestants.  Issues to address at subsequent visits: None   Pharmacist comments: [name] presenting for HF clinic with her medication list.  Pt was started on doxy and keflex for cellulitis x 10 days. Pt denies SE to her current regimen. Pt states that she takes Ibuprofen 600 mg PRN pain.  Advised pt on the negative fluid retention, bleeding risk, and kidney risk of taking chronic NSAIDs.  Recommended pt try OTC APAP PRN and see how that works for her pain.   Time with patient: 10 min  Preparation and documentation time: 5 min  Total time: 15 min

## 2015-04-25 NOTE — Patient Instructions (Signed)
Take an additional 20 mg of Torsemide today and tomorrow, then resume normal dose  Your physician recommends that you schedule a follow-up appointment in: 3 months In the [facility]

## 2015-04-25 NOTE — Progress Notes (Signed)
Patient ID: [name], female   DOB: [birth year], 70 y.o.   MRN: [id number]  ADVANCED HF CLINIC NOTE  Patient ID: YANELLI HELFGOTT, female   DOB: 09/21/1945, 70 y.o.   MRN: [id number]  Primary Cardiologist: Dr. Dani Gobble Croitoru Primary HF: Dr. Haroldine Laws   HPI: Ms Guye is a 70 year old with history of morbid obesity, DM, HTN, asthma, OSA, ICD, GERD, arthritis, CRT-D 2008 [employer], CAD, S/P CABG x5 11/29/2014   Discharged from [facility] 12/07/14 s/p CABG. Discharge weight 252.  Went to [facility].  Readmitted 12/21/14 with RLE cellulitis and volume overload with weight of 273 and complaints of dyspnea and orthopnea. RLE cellulitis treated with IV ABX. She was had brisk diuresis with lasix gtt. Diuresis slowed down with slight Cr bump. Acetazolamide added and monitored closely with history of sulfa allergy. Diuresed well, but lasix held with continued Cr bump. Transitioned to po torsemide for d/c. Discharge weight 246 lbs.  Today she is being seen for an acute work in due to increased edema. Says she had a fall about 1 month ago. Says she sprained her left ankle. Recently started on keflex and doxycycline for RLE cellulitis. Weight at home 237- 240s. Drinking a couple gatorade every day. Drinking> 2 liters per day. She lives with her 2 sons grandchildren. Requires assistance with ADls. SOB with exertion. Denies PND/Orthopnea.    Echo 11/24/14 EF 55-60%, Grade 1 DD. Mild TR TEE 11/29/14 EF 55-60%, LVH, Mild AR and MR  Labs 02/21/2015: K 4.5 Creatinine 1.39   Past Medical History  Diagnosis Date  . Asthma   . Hypertension   . Arthritis   . GERD (gastroesophageal reflux disease)   . Diverticulosis   . Anemia   . CAD (coronary artery disease)   . Fatty liver disease, nonalcoholic   . Internal hemorrhoids   . Gastritis   . Hyperplastic colon polyp   . OSA (obstructive sleep apnea)   . ICD (implantable cardiac defibrillator) in place   . Shortness of breath   . IBS (irritable  bowel syndrome)   . H/O hiatal hernia   . Obesity   . Diabetes mellitus without complication ([place])   . Cellulitis and abscess of foot 12/2014    RT FOOT    Current Outpatient Prescriptions  Medication Sig Dispense Refill  . albuterol (PROVENTIL HFA;VENTOLIN HFA) 108 (90 BASE) MCG/ACT inhaler Inhale 2 puffs into the lungs every 4 (four) hours as needed for wheezing (wheezing).     [name] aspirin EC 325 MG EC tablet Take 1 tablet (325 mg total) by mouth daily. 30 tablet 0  . carvedilol (COREG) 6.25 MG tablet Take 1 tablet (6.25 mg total) by mouth 2 (two) times daily with a meal. 60 tablet 3  . cetirizine (ZYRTEC) 10 MG tablet Take 10 mg by mouth daily.     . clotrimazole (MYCELEX) 10 MG troche Take 10 mg by mouth every 4 (four) hours as needed (mouth pain).    . fluticasone (FLONASE) 50 MCG/ACT nasal spray Place 1 spray into the nose 2 (two) times daily as needed for rhinitis or allergies.     [name] KLOR-CON M20 20 MEQ tablet TAKE ONE TABLET BY MOUTH ONCE DAILY 60 tablet 4  . omeprazole (PRILOSEC) 40 MG capsule Take 1 capsule (40 mg total) by mouth daily. 30 capsule 11  . pravastatin (PRAVACHOL) 80 MG tablet Take 1 tablet (80 mg total) by mouth every evening. 90 tablet 2  . spironolactone (ALDACTONE) 25 MG  tablet Take 25 mg by mouth daily.    . SYMBICORT 160-4.5 MCG/ACT inhaler Inhale 2 puffs into the lungs 2 (two) times daily.    [name] torsemide (DEMADEX) 20 MG tablet Take 1 tablet (20 mg total) by mouth 2 (two) times daily. (Patient taking differently: Take 40 mg by mouth 2 (two) times daily. ) 60 tablet 3  . triamcinolone cream (KENALOG) 0.1 % Apply 1 application topically daily as needed (affected area).      No current facility-administered medications for this encounter.    Allergies  Allergen Reactions  . Ivp Dye [Iodinated Diagnostic Agents] Shortness Of Breath    No reaction to PO contrast with non-ionic dye.06-25-2014/rsm  . Shellfish-Derived Products Anaphylaxis  . Sulfa Antibiotics  Shortness Of Breath  . Iodine Hives  . Zithromax [Azithromycin] Rash      Social History   Social History  . Marital Status: Widowed    Spouse Name: N/A  . Number of Children: 5  . Years of Education: N/A   Occupational History  . 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 Smoking status: Former Smoker -- 0.25 packs/day for 3 years    Types: Cigarettes    Quit date: 02/11/1968  . Smokeless tobacco: Never Used  . Alcohol Use: No  . Drug Use: No  . Sexual Activity: No   Other Topics Concern  . Not on file   Social History Narrative   Widowed last year. She lives with her two sons.      Family History  Problem Relation Age of Onset  . Breast cancer Mother   . Diabetes Mother   . Heart disease Maternal Grandmother   . Kidney disease Maternal Grandmother   . Diabetes Maternal Grandmother   . Glaucoma Maternal Aunt   . Heart disease Maternal Aunt   . Asthma Sister     Danley Danker Vitals:   04/25/15 1019  BP: 142/84  Pulse: 81  Weight: 245 lb 12.8 oz (111.494 kg)  SpO2: 95%    PHYSICAL EXAM: General:   NAD. Arrived in a wheel chair.  HEENT: normal Neck: supple.  JVD 8-9  Carotids 2+ bilat; no bruits. No thyromegaly or nodule noted. Cor: PMI nondisplaced. RRR. No M/G/R. Sternum stable. Mild soreness to palpation Lungs: Diminished throughout but clear Abdomen: Obese, soft, NT, ND. No HSM. No bruits or masses. +BS Extremities: no cyanosis or clubbing. LLE 1+ edema. Mild erythema.  Neuro: alert & oriented x 3, cranial nerves grossly intact. moves all 4 extremities w/o difficulty. Affect pleasant.   ASSESSMENT & PLAN:  1. Chronic diastolic HF - TEE 99991111 EF 55-60%, mild LVH.  NYHA III. Volume status elevated likely due to excessive fluid intake. I have asked her to stop drinking Gatorade and cut back fluid intake to < 2 liter per day.  Continue torsemide 40 mg daily and add extra 20 mg torsemide in the evening for 2 days.  Continue  potassium 20 meq daily.  I have asked her to weigh and record daily.  2. CAD s/p CABG 11/2014 On statin, bb, aspirin. No chest pain.  3. Morbid obesity- needs to lose weight. Discussed portion control.  4. H/o heart block s/p BiV pacer/CRT-D [employer] - with malfunction of A lead and chronic penetration of V lead into pericardium. EP followed in hospital at that time recommended no further intervention.  6. Chronic renal failure stage III 7. Cellulitis, left lower extremity - Currently on 2 antibiotics per PCP.  Follow up in 3 months   Amy Clegg,NP-C  10:22 AM

## 2015-04-27 ENCOUNTER — Ambulatory Visit (HOSPITAL_COMMUNITY): Payer: Medicare Other

## 2015-04-29 ENCOUNTER — Emergency Department (HOSPITAL_COMMUNITY)
Admit: 2015-04-29 | Discharge: 2015-04-29 | Disposition: A | Discharge status: Home / Self Care | Payer: Medicare Other | Attending: Emergency Medicine | Admitting: Emergency Medicine

## 2015-04-29 ENCOUNTER — Inpatient Hospital Stay (HOSPITAL_COMMUNITY)
Admission: EM | Admit: 2015-04-29 | Discharge: 2015-05-05 | DRG: 602 | Disposition: A | Discharge status: Home / Self Care | Payer: Medicare Other | Attending: Internal Medicine | Admitting: Internal Medicine

## 2015-04-29 ENCOUNTER — Emergency Department (HOSPITAL_COMMUNITY): Payer: Medicare Other

## 2015-04-29 ENCOUNTER — Encounter (HOSPITAL_COMMUNITY): Payer: Self-pay | Admitting: Emergency Medicine

## 2015-04-29 DIAGNOSIS — T364X5A Adverse effect of tetracyclines, initial encounter: Secondary | ICD-10-CM | POA: Diagnosis present

## 2015-04-29 DIAGNOSIS — Z83511 Family history of glaucoma: Secondary | ICD-10-CM

## 2015-04-29 DIAGNOSIS — Z91013 Allergy to seafood: Secondary | ICD-10-CM | POA: Diagnosis not present

## 2015-04-29 DIAGNOSIS — Z95810 Presence of automatic (implantable) cardiac defibrillator: Secondary | ICD-10-CM

## 2015-04-29 DIAGNOSIS — M1990 Unspecified osteoarthritis, unspecified site: Secondary | ICD-10-CM | POA: Diagnosis present

## 2015-04-29 DIAGNOSIS — Z833 Family history of diabetes mellitus: Secondary | ICD-10-CM

## 2015-04-29 DIAGNOSIS — M7989 Other specified soft tissue disorders: Secondary | ICD-10-CM | POA: Diagnosis not present

## 2015-04-29 DIAGNOSIS — Z9861 Coronary angioplasty status: Secondary | ICD-10-CM

## 2015-04-29 DIAGNOSIS — I5031 Acute diastolic (congestive) heart failure: Secondary | ICD-10-CM | POA: Diagnosis present

## 2015-04-29 DIAGNOSIS — Z7982 Long term (current) use of aspirin: Secondary | ICD-10-CM

## 2015-04-29 DIAGNOSIS — Z8249 Family history of ischemic heart disease and other diseases of the circulatory system: Secondary | ICD-10-CM | POA: Diagnosis not present

## 2015-04-29 DIAGNOSIS — Z87891 Personal history of nicotine dependence: Secondary | ICD-10-CM | POA: Diagnosis not present

## 2015-04-29 DIAGNOSIS — L03116 Cellulitis of left lower limb: Secondary | ICD-10-CM | POA: Diagnosis present

## 2015-04-29 DIAGNOSIS — L270 Generalized skin eruption due to drugs and medicaments taken internally: Secondary | ICD-10-CM | POA: Diagnosis present

## 2015-04-29 DIAGNOSIS — K529 Noninfective gastroenteritis and colitis, unspecified: Secondary | ICD-10-CM | POA: Diagnosis present

## 2015-04-29 DIAGNOSIS — M79609 Pain in unspecified limb: Secondary | ICD-10-CM | POA: Diagnosis not present

## 2015-04-29 DIAGNOSIS — D649 Anemia, unspecified: Secondary | ICD-10-CM | POA: Diagnosis not present

## 2015-04-29 DIAGNOSIS — Z841 Family history of disorders of kidney and ureter: Secondary | ICD-10-CM

## 2015-04-29 DIAGNOSIS — N183 Chronic kidney disease, stage 3 (moderate): Secondary | ICD-10-CM | POA: Diagnosis present

## 2015-04-29 DIAGNOSIS — Z825 Family history of asthma and other chronic lower respiratory diseases: Secondary | ICD-10-CM

## 2015-04-29 DIAGNOSIS — T3795XA Adverse effect of unspecified systemic anti-infective and antiparasitic, initial encounter: Secondary | ICD-10-CM

## 2015-04-29 DIAGNOSIS — E785 Hyperlipidemia, unspecified: Secondary | ICD-10-CM | POA: Diagnosis present

## 2015-04-29 DIAGNOSIS — L0390 Cellulitis, unspecified: Secondary | ICD-10-CM | POA: Diagnosis present

## 2015-04-29 DIAGNOSIS — Z883 Allergy status to other anti-infective agents status: Secondary | ICD-10-CM | POA: Diagnosis not present

## 2015-04-29 DIAGNOSIS — I2510 Atherosclerotic heart disease of native coronary artery without angina pectoris: Secondary | ICD-10-CM | POA: Diagnosis present

## 2015-04-29 DIAGNOSIS — B373 Candidiasis of vulva and vagina: Secondary | ICD-10-CM | POA: Diagnosis present

## 2015-04-29 DIAGNOSIS — Z91041 Radiographic dye allergy status: Secondary | ICD-10-CM

## 2015-04-29 DIAGNOSIS — I10 Essential (primary) hypertension: Secondary | ICD-10-CM | POA: Diagnosis present

## 2015-04-29 DIAGNOSIS — I5032 Chronic diastolic (congestive) heart failure: Secondary | ICD-10-CM | POA: Diagnosis present

## 2015-04-29 DIAGNOSIS — E1122 Type 2 diabetes mellitus with diabetic chronic kidney disease: Secondary | ICD-10-CM | POA: Diagnosis present

## 2015-04-29 DIAGNOSIS — Z951 Presence of aortocoronary bypass graft: Secondary | ICD-10-CM | POA: Diagnosis not present

## 2015-04-29 DIAGNOSIS — D509 Iron deficiency anemia, unspecified: Secondary | ICD-10-CM | POA: Diagnosis present

## 2015-04-29 DIAGNOSIS — J45909 Unspecified asthma, uncomplicated: Secondary | ICD-10-CM | POA: Diagnosis present

## 2015-04-29 DIAGNOSIS — K7581 Nonalcoholic steatohepatitis (NASH): Secondary | ICD-10-CM | POA: Diagnosis present

## 2015-04-29 DIAGNOSIS — Z803 Family history of malignant neoplasm of breast: Secondary | ICD-10-CM | POA: Diagnosis not present

## 2015-04-29 DIAGNOSIS — G4733 Obstructive sleep apnea (adult) (pediatric): Secondary | ICD-10-CM | POA: Diagnosis present

## 2015-04-29 DIAGNOSIS — I130 Hypertensive heart and chronic kidney disease with heart failure and stage 1 through stage 4 chronic kidney disease, or unspecified chronic kidney disease: Secondary | ICD-10-CM | POA: Diagnosis present

## 2015-04-29 DIAGNOSIS — I5033 Acute on chronic diastolic (congestive) heart failure: Secondary | ICD-10-CM | POA: Diagnosis present

## 2015-04-29 DIAGNOSIS — L299 Pruritus, unspecified: Secondary | ICD-10-CM | POA: Diagnosis not present

## 2015-04-29 DIAGNOSIS — M549 Dorsalgia, unspecified: Secondary | ICD-10-CM

## 2015-04-29 DIAGNOSIS — Z79899 Other long term (current) drug therapy: Secondary | ICD-10-CM | POA: Diagnosis not present

## 2015-04-29 DIAGNOSIS — Z7951 Long term (current) use of inhaled steroids: Secondary | ICD-10-CM

## 2015-04-29 DIAGNOSIS — Z882 Allergy status to sulfonamides status: Secondary | ICD-10-CM | POA: Diagnosis not present

## 2015-04-29 DIAGNOSIS — K219 Gastro-esophageal reflux disease without esophagitis: Secondary | ICD-10-CM | POA: Diagnosis present

## 2015-04-29 DIAGNOSIS — Z86010 Personal history of colonic polyps: Secondary | ICD-10-CM

## 2015-04-29 DIAGNOSIS — F419 Anxiety disorder, unspecified: Secondary | ICD-10-CM | POA: Diagnosis present

## 2015-04-29 DIAGNOSIS — R52 Pain, unspecified: Secondary | ICD-10-CM

## 2015-04-29 DIAGNOSIS — Z6841 Body Mass Index (BMI) 40.0 and over, adult: Secondary | ICD-10-CM

## 2015-04-29 DIAGNOSIS — Z881 Allergy status to other antibiotic agents status: Secondary | ICD-10-CM | POA: Diagnosis not present

## 2015-04-29 LAB — URINALYSIS, ROUTINE W REFLEX MICROSCOPIC
Bilirubin Urine: NEGATIVE
Glucose, UA: NEGATIVE mg/dL
Hgb urine dipstick: NEGATIVE
Ketones, ur: NEGATIVE mg/dL
Nitrite: NEGATIVE
Protein, ur: NEGATIVE mg/dL
Specific Gravity, Urine: 1.023 (ref 1.005–1.030)
pH: 5.5 (ref 5.0–8.0)

## 2015-04-29 LAB — CBC WITH DIFFERENTIAL/PLATELET
Basophils Absolute: 0 10*3/uL (ref 0.0–0.1)
Basophils Relative: 0 %
Eosinophils Absolute: 0.3 10*3/uL (ref 0.0–0.7)
Eosinophils Relative: 3 %
HCT: 39.2 % (ref 36.0–46.0)
Hemoglobin: 11.9 g/dL — ABNORMAL LOW (ref 12.0–15.0)
Lymphocytes Relative: 11 %
Lymphs Abs: 1.1 10*3/uL (ref 0.7–4.0)
MCH: 23 pg — ABNORMAL LOW (ref 26.0–34.0)
MCHC: 30.4 g/dL (ref 30.0–36.0)
MCV: 75.7 fL — ABNORMAL LOW (ref 78.0–100.0)
Monocytes Absolute: 0.3 10*3/uL (ref 0.1–1.0)
Monocytes Relative: 3 %
Neutro Abs: 8.4 10*3/uL — ABNORMAL HIGH (ref 1.7–7.7)
Neutrophils Relative %: 83 %
Platelets: 249 10*3/uL (ref 150–400)
RBC: 5.18 MIL/uL — ABNORMAL HIGH (ref 3.87–5.11)
RDW: 18.3 % — ABNORMAL HIGH (ref 11.5–15.5)
WBC: 10.1 10*3/uL (ref 4.0–10.5)

## 2015-04-29 LAB — BASIC METABOLIC PANEL
Anion gap: 11 (ref 5–15)
BUN: 41 mg/dL — ABNORMAL HIGH (ref 6–20)
CO2: 24 mmol/L (ref 22–32)
Calcium: 9.4 mg/dL (ref 8.9–10.3)
Chloride: 106 mmol/L (ref 101–111)
Creatinine, Ser: 1.67 mg/dL — ABNORMAL HIGH (ref 0.44–1.00)
GFR calc Af Amer: 34 mL/min — ABNORMAL LOW (ref >60)
GFR calc non Af Amer: 30 mL/min — ABNORMAL LOW (ref >60)
Glucose, Bld: 131 mg/dL — ABNORMAL HIGH (ref 65–99)
Potassium: 4 mmol/L (ref 3.5–5.1)
Sodium: 141 mmol/L (ref 135–145)

## 2015-04-29 LAB — SEDIMENTATION RATE: Sed Rate: 39 mm/hr — ABNORMAL HIGH (ref 0–22)

## 2015-04-29 LAB — WET PREP, GENITAL
Clue Cells Wet Prep HPF POC: NONE SEEN
Sperm: NONE SEEN
Trich, Wet Prep: NONE SEEN
WBC, Wet Prep HPF POC: NONE SEEN
Yeast Wet Prep HPF POC: NONE SEEN

## 2015-04-29 LAB — TROPONIN I: Troponin I: <0.03 ng/mL (ref <0.031)

## 2015-04-29 LAB — URINE MICROSCOPIC-ADD ON
Bacteria, UA: NONE SEEN
RBC / HPF: NONE SEEN RBC/hpf (ref 0–5)

## 2015-04-29 LAB — PROCALCITONIN: Procalcitonin: <0.1 ng/mL

## 2015-04-29 MED ORDER — ACETAMINOPHEN 325 MG PO TABS
650.0000 mg | ORAL_TABLET | Freq: Four times a day (QID) | ORAL | Status: DC | PRN
  Administered 2015-04-30: 650 mg via ORAL
  Filled 2015-04-29: qty 2

## 2015-04-29 MED ORDER — LORATADINE 10 MG PO TABS
10.0000 mg | ORAL_TABLET | Freq: Every day | ORAL | Status: DC
Start: 2015-04-29 — End: 2015-05-05
  Administered 2015-04-29 – 2015-05-05 (×7): 10 mg via ORAL
  Filled 2015-04-29 (×7): qty 1

## 2015-04-29 MED ORDER — VANCOMYCIN HCL 10 G IV SOLR: 1250.0000 mg | INTRAVENOUS | Status: DC

## 2015-04-29 MED ORDER — HEPARIN SODIUM (PORCINE) 5000 UNIT/ML IJ SOLN
5000.0000 [IU] | Freq: Three times a day (TID) | INTRAMUSCULAR | Status: DC
  Administered 2015-04-29 – 2015-05-05 (×16): 5000 [IU] via SUBCUTANEOUS
  Filled 2015-04-29 (×21): qty 1

## 2015-04-29 MED ORDER — SODIUM CHLORIDE 0.9% FLUSH
3.0000 mL | Freq: Two times a day (BID) | INTRAVENOUS | Status: DC
  Administered 2015-04-30 – 2015-05-04 (×8): 3 mL via INTRAVENOUS

## 2015-04-29 MED ORDER — DIPHENHYDRAMINE HCL 25 MG PO CAPS
25.0000 mg | ORAL_CAPSULE | Freq: Once | ORAL | Status: AC
  Administered 2015-04-29: 25 mg via ORAL
  Filled 2015-04-29: qty 1

## 2015-04-29 MED ORDER — PRAVASTATIN SODIUM 80 MG PO TABS
80.0000 mg | ORAL_TABLET | Freq: Every evening | ORAL | Status: DC
  Administered 2015-04-30 – 2015-05-04 (×5): 80 mg via ORAL
  Filled 2015-04-29 (×6): qty 1

## 2015-04-29 MED ORDER — ACETAMINOPHEN 650 MG RE SUPP: 650.0000 mg | Freq: Four times a day (QID) | RECTAL | Status: DC | PRN

## 2015-04-29 MED ORDER — MOMETASONE FURO-FORMOTEROL FUM 200-5 MCG/ACT IN AERO
2.0000 | INHALATION_SPRAY | Freq: Two times a day (BID) | RESPIRATORY_TRACT | Status: DC
  Administered 2015-04-30 – 2015-05-05 (×11): 2 via RESPIRATORY_TRACT
  Filled 2015-04-29: qty 8.8

## 2015-04-29 MED ORDER — DIPHENHYDRAMINE HCL 50 MG/ML IJ SOLN
25.0000 mg | Freq: Four times a day (QID) | INTRAMUSCULAR | Status: DC | PRN
  Administered 2015-04-29 – 2015-04-30 (×2): 25 mg via INTRAVENOUS
  Filled 2015-04-29 (×2): qty 1

## 2015-04-29 MED ORDER — DEXTROSE 5 % IV SOLN
2.0000 g | INTRAVENOUS | Status: DC
  Administered 2015-04-29: 2 g via INTRAVENOUS
  Filled 2015-04-29: qty 2

## 2015-04-29 MED ORDER — CARVEDILOL 6.25 MG PO TABS
6.2500 mg | ORAL_TABLET | Freq: Two times a day (BID) | ORAL | Status: DC
  Administered 2015-04-30 – 2015-05-05 (×11): 6.25 mg via ORAL
  Filled 2015-04-29 (×14): qty 1

## 2015-04-29 MED ORDER — VANCOMYCIN HCL 10 G IV SOLR
2000.0000 mg | Freq: Once | INTRAVENOUS | Status: AC
  Administered 2015-04-29: 2000 mg via INTRAVENOUS
  Filled 2015-04-29: qty 2000

## 2015-04-29 MED ORDER — HYDROCODONE-ACETAMINOPHEN 5-325 MG PO TABS
1.0000 | ORAL_TABLET | ORAL | Status: DC | PRN
  Administered 2015-04-29 – 2015-05-04 (×9): 2 via ORAL
  Filled 2015-04-29 (×9): qty 2

## 2015-04-29 MED ORDER — ALBUTEROL SULFATE HFA 108 (90 BASE) MCG/ACT IN AERS: 2.0000 | INHALATION_SPRAY | RESPIRATORY_TRACT | Status: DC | PRN

## 2015-04-29 MED ORDER — ASPIRIN EC 325 MG PO TBEC
325.0000 mg | DELAYED_RELEASE_TABLET | Freq: Every day | ORAL | Status: DC
  Administered 2015-04-30 – 2015-05-05 (×5): 325 mg via ORAL
  Filled 2015-04-29 (×7): qty 1

## 2015-04-29 MED ORDER — PANTOPRAZOLE SODIUM 40 MG PO TBEC
40.0000 mg | DELAYED_RELEASE_TABLET | Freq: Every day | ORAL | Status: DC
  Administered 2015-04-29 – 2015-05-05 (×7): 40 mg via ORAL
  Filled 2015-04-29 (×7): qty 1

## 2015-04-29 MED ORDER — FLUCONAZOLE 150 MG PO TABS
150.0000 mg | ORAL_TABLET | Freq: Once | ORAL | Status: AC
  Administered 2015-04-29: 150 mg via ORAL
  Filled 2015-04-29: qty 1

## 2015-04-29 MED ORDER — BISACODYL 5 MG PO TBEC
5.0000 mg | DELAYED_RELEASE_TABLET | Freq: Every day | ORAL | Status: DC | PRN
  Administered 2015-04-30: 5 mg via ORAL
  Filled 2015-04-29: qty 1

## 2015-04-29 MED ORDER — SODIUM CHLORIDE 0.9% FLUSH: 3.0000 mL | INTRAVENOUS | Status: DC | PRN

## 2015-04-29 MED ORDER — POLYETHYLENE GLYCOL 3350 17 G PO PACK: 17.0000 g | PACK | Freq: Every day | ORAL | Status: DC | PRN

## 2015-04-29 MED ORDER — ONDANSETRON HCL 4 MG PO TABS: 4.0000 mg | ORAL_TABLET | Freq: Four times a day (QID) | ORAL | Status: DC | PRN

## 2015-04-29 MED ORDER — HYDRALAZINE HCL 20 MG/ML IJ SOLN: 10.0000 mg | INTRAMUSCULAR | Status: DC | PRN

## 2015-04-29 MED ORDER — ONDANSETRON HCL 4 MG/2ML IJ SOLN: 4.0000 mg | Freq: Four times a day (QID) | INTRAMUSCULAR | Status: DC | PRN

## 2015-04-29 MED ORDER — ALBUTEROL SULFATE (2.5 MG/3ML) 0.083% IN NEBU
2.5000 mg | INHALATION_SOLUTION | RESPIRATORY_TRACT | Status: DC | PRN
  Administered 2015-04-29 – 2015-05-02 (×2): 2.5 mg via RESPIRATORY_TRACT
  Filled 2015-04-29 (×2): qty 3

## 2015-04-29 MED ORDER — CLOTRIMAZOLE 10 MG MT TROC
10.0000 mg | OROMUCOSAL | Status: DC | PRN
  Filled 2015-04-29: qty 1

## 2015-04-29 MED ORDER — SODIUM CHLORIDE 0.9 % IV SOLN: 250.0000 mL | INTRAVENOUS | Status: DC | PRN

## 2015-04-29 MED ORDER — ACETAMINOPHEN 325 MG PO TABS
650.0000 mg | ORAL_TABLET | Freq: Once | ORAL | Status: AC
  Administered 2015-04-29: 650 mg via ORAL
  Filled 2015-04-29: qty 2

## 2015-04-29 MED ORDER — FLUTICASONE PROPIONATE 50 MCG/ACT NA SUSP
2.0000 | Freq: Every day | NASAL | Status: DC
Start: 2015-04-29 — End: 2015-05-05
  Administered 2015-04-29 – 2015-05-05 (×7): 2 via NASAL
  Filled 2015-04-29: qty 16

## 2015-04-29 MED ORDER — DEXTROSE 5 % IV SOLN: 2.0000 g | INTRAVENOUS | Status: DC

## 2015-04-29 NOTE — H&P (Signed)
[facility] History and Physical  [name] [id number] DOB: 1945/07/12 DOA: 04/29/2015  Referring physician: ED physician PCP: [name], MD  Specialists: Dr. Silverio Decamp (GI), Dr. Orene Desanctis (cardiology), Dr. Roxan Hockey ([place])   Chief Complaint:  Left LE swelling and erythema, generalized rash  HPI: [name] is a 70 y.o. female with PMH of type 2 diabetes mellitus, hypertension, NASH, morbid obesity, and CAD status post 5 vessel CABG in October 2016 who presents to the ED with progressive swelling, redness, and pain at the lower left leg despite a course of outpatient antibiotics. Patient sought evaluation from her PCP on 04/23/2015 for left lower extremity swelling and erythema. She was diagnosed with cellulitis at that time and given a course of doxycycline and Keflex. She reports close adherence to the prescribed regimen, but notes that the lower extremity symptoms have progressed. Also over this interval, she has developed a generalized erythematous and pruritic rash. She denies any fevers, but endorses chills. There's been no lightheadedness, palpitations, or change in urination. She describes a lower extremity pain as severe, better with rest, worse with any movement or palpation, and achy in character. Patient also notes development of vulvar erythema and pruritus since starting antibiotics. She reports a scant white discharge but no foul odor.  In ED, patient was found to be afebrile, saturating well on room air, and with vital signs stable. Blood work is notable for a serum creatinine of 1.67, up from an apparent baseline of 1.3. BUN-to-serum creatinine ratio is 25. There is a microcytic anemia with hemoglobin of 11.9, up from an apparent baseline of 9-10. MCV is 75.7 and previously been within the normal range. In the emergency department, empiric vancomycin was initiated for treatment of suspected cellulitis that is felt outpatient therapy. Acetaminophen was given  1 for pain, and Benadryl administered for itch associated with generalized rash. Wet prep was performed and remains pending. Venous ultrasound the left lower extremity was performed and preliminary read is negative for DVT. Patient remained hemodynamically stable in the ED and will be admitted for ongoing evaluation and management of suspected left lower extremity cellulitis with failure of outpatient therapy.   Where does patient live?   At home    Can patient participate in ADLs?  Yes        Review of Systems:   General: no fevers, sweats, weight change, poor appetite, or fatigue. Chills HEENT: no blurry vision, hearing changes or sore throat Pulm: no dyspnea, cough, or wheeze CV: no chest pain or palpitations Abd: no nausea, vomiting, abdominal pain, diarrhea, or constipation GU: no dysuria, hematuria, increased urinary frequency, or urgency. Vulvar erythema, pruruitis  Ext: Chronic b/l LE edema  Neuro: no focal weakness, numbness, or tingling, no vision change or hearing loss Skin:  no wounds. Generalized erythematous and pruritic rash MSK: No muscle spasm, no deformity, no red, hot, or swollen joint Heme: No easy bruising or bleeding Travel history: No recent long distant travel    Allergy:  Allergies  Allergen Reactions  . Ivp Dye [Iodinated Diagnostic Agents] Shortness Of Breath    No reaction to PO contrast with non-ionic dye.06-25-2014/rsm  . Shellfish-Derived Products Anaphylaxis  . Sulfa Antibiotics Shortness Of Breath  . Iodine Hives  . Zithromax [Azithromycin] Rash    Past Medical History  Diagnosis Date  . Asthma   . Hypertension   . Arthritis   . GERD (gastroesophageal reflux disease)   . Diverticulosis   . Anemia   . CAD (coronary artery  disease)   . Fatty liver disease, nonalcoholic   . Internal hemorrhoids   . Gastritis   . Hyperplastic colon polyp   . OSA (obstructive sleep apnea)   . ICD (implantable cardiac defibrillator) in place   . Shortness of  breath   . IBS (irritable bowel syndrome)   . H/O hiatal hernia   . Obesity   . Diabetes mellitus without complication ([place])   . Cellulitis and abscess of foot 12/2014    RT FOOT    Past Surgical History  Procedure Laterality Date  . Pacemaker insertion  2001    BS BivICD  . Cardiac defibrillator placement    . Appendectomy    . Knee surgery      right  . Ankle surgery      right  . Tubal ligation    . Cervical spine surgery      plate   . Coronary angiogram  2010  . Abdominal ultrasound  12/01/2011    Peripelvic cysts- #1- 2.4x1.9x2.3cm, #2-1.2x0.9x1.2cm  . Cardiac catheterization  05/17/1999    No significant coronary obstructive disease w/ mild 20% luminal irregularity of the first diag branch of the LAD  . Cardiac catheterization  07/08/2002    No significant CAD, moderately depressed LV systolic function  . Cardiac catheterization Bilateral 04/26/2007    Normal findings, recommend medical therapy  . Cardiac catheterization  02/18/2008    Moderate CAD, would benefit from having a functional study, recommend continue medical therapy  . Cardiac catheterization  07/23/2012    Medical therapy  . Lexiscan myoview  11/14/2011    Mild-moderate defect seen in Mid Inferolateral and Mid Anterolateral regions-consistant w/ infarct/scar. No significant ischemia demonstrated.  . Transthoracic echocardiogram  07/23/2012    EF 55-60%, normal-mild  . Left heart catheterization with coronary angiogram N/A 07/23/2012    Procedure: LEFT HEART CATHETERIZATION WITH CORONARY ANGIOGRAM;  Surgeon: Leonie Man, MD;  Location: [facility] CATH LAB;  Service: Cardiovascular;  Laterality: N/A;  . Biv pacemaker generator change out N/A 11/02/2012    Procedure: BIV PACEMAKER GENERATOR CHANGE OUT;  Surgeon: Sanda Klein, MD;  Location: [place] CATH LAB;  Service: Cardiovascular;  Laterality: N/A;  . Cardiac catheterization N/A 11/24/2014    Procedure: Left Heart Cath and Coronary Angiography;  Surgeon: Troy Sine,  MD;  Location: [place] CV LAB;  Service: Cardiovascular;  Laterality: N/A;  . Cardiac catheterization  11/27/2014    Procedure: Intravascular Pressure Wire/FFR Study;  Surgeon: Peter M Martinique, MD;  Location: [place] CV LAB;  Service: Cardiovascular;;  . Coronary artery bypass graft N/A 11/29/2014    Procedure: CORONARY ARTERY BYPASS GRAFTING x 5 (LIMA-LAD, SVG-D, SVG-OM1-OM2, SVG-PD);  Surgeon: Melrose Nakayama, MD;  Location: [place];  Service: Open Heart Surgery;  Laterality: N/A;  . Tee without cardioversion N/A 11/29/2014    Procedure: TRANSESOPHAGEAL ECHOCARDIOGRAM (TEE);  Surgeon: Melrose Nakayama, MD;  Location: [place];  Service: Open Heart Surgery;  Laterality: N/A;    Social History:  reports that she quit smoking about 47 years ago. Her smoking use included Cigarettes. She has a .75 pack-year smoking history. She has never used smokeless tobacco. She reports that she does not drink alcohol or use illicit drugs.  Family History:  Family History  Problem Relation Age of Onset  . Breast cancer Mother   . Diabetes Mother   . Heart disease Maternal Grandmother   . Kidney disease Maternal Grandmother   . Diabetes Maternal Grandmother   .  Glaucoma Maternal Aunt   . Heart disease Maternal Aunt   . Asthma Sister      Prior to Admission medications   Medication Sig Start Date End Date Taking? Authorizing Provider  albuterol (PROVENTIL HFA;VENTOLIN HFA) 108 (90 BASE) MCG/ACT inhaler Inhale 2 puffs into the lungs every 4 (four) hours as needed for wheezing (wheezing).    Yes Historical Provider, MD  aspirin EC 325 MG EC tablet Take 1 tablet (325 mg total) by mouth daily. 12/07/14  Yes Coolidge Breeze, PA-C  carvedilol (COREG) 6.25 MG tablet Take 1 tablet (6.25 mg total) by mouth 2 (two) times daily with a meal. 03/19/15  Yes Larey Dresser, MD  cephALEXin (KEFLEX) 500 MG capsule Take 500 mg by mouth 3 (three) times daily.   Yes Historical Provider, MD  cetirizine (ZYRTEC) 10 MG  tablet Take 10 mg by mouth daily.    Yes Historical Provider, MD  clotrimazole (MYCELEX) 10 MG troche Take 10 mg by mouth every 4 (four) hours as needed (mouth pain).   Yes Historical Provider, MD  doxycycline (VIBRA-TABS) 100 MG tablet Take 100 mg by mouth 2 (two) times daily.   Yes Historical Provider, MD  fluticasone (FLONASE) 50 MCG/ACT nasal spray Place 1 spray into the nose 2 (two) times daily as needed for rhinitis or allergies.    Yes Historical Provider, MD  ibuprofen (ADVIL,MOTRIN) 200 MG tablet Take 600 mg by mouth every 6 (six) hours as needed for headache, mild pain or moderate pain.    Yes Historical Provider, MD  KLOR-CON M20 20 MEQ tablet TAKE ONE TABLET BY MOUTH ONCE DAILY Patient taking differently: TAKE 20 MEQ BY MOUTH ONCE DAILY 04/05/15  Yes [name], MD  omeprazole (PRILOSEC) 40 MG capsule Take 1 capsule (40 mg total) by mouth daily. 03/30/15  Yes [name], MD  pravastatin (PRAVACHOL) 80 MG tablet Take 1 tablet (80 mg total) by mouth every evening. 10/31/14  Yes [name], MD  spironolactone (ALDACTONE) 25 MG tablet Take 25 mg by mouth daily. 11/10/14  Yes Historical Provider, MD  SYMBICORT 160-4.5 MCG/ACT inhaler Inhale 2 puffs into the lungs 2 (two) times daily. 12/02/13  Yes Historical Provider, MD  torsemide (DEMADEX) 20 MG tablet Take 40 mg by mouth 2 (two) times daily.   Yes Historical Provider, MD  triamcinolone cream (KENALOG) 0.1 % Apply 1 application topically daily as needed (affected area).  08/24/14  Yes Historical Provider, MD    Physical Exam: Filed Vitals:   04/29/15 1419 04/29/15 1436 04/29/15 1652  BP: 150/109 113/67 134/57  Pulse: 85 92 81  Temp:  98.2 F (36.8 C)   TempSrc:  Oral   Resp: 20 16 18   SpO2: 96% 94% 99%   General: Not in acute distress; eating a [employer] sandwich HEENT:       Eyes: PERRL, EOMI, no scleral icterus or conjunctival pallor.       ENT: No discharge from the ears or nose, no pharyngeal ulcers.        Neck: No  JVD, no bruit, no appreciable mass Heme: No cervical adenopathy, no pallor Cardiac: S1/S2, RRR, No murmurs, No gallops or rubs. Pulm: Good air movement bilaterally. No rales, wheezing, rhonchi or rubs. Abd: Soft, nondistended, nontender, no rebound pain or gaurding, BS present. Ext: Bilateral LE pitting edema to the knees, Lt > Rt. 2+DP/PT pulse bilaterally. LLE erythema and tenderness surrounds the ankle circumferentially and extends proximally along the anterior aspect to the knee. Not particularly  hot relative to RLE. No drainage or fluctuance appreciated.  Musculoskeletal: Aside from distal LLE findings, no gross deformity, no red, hot, swollen joints   Skin: No wounds on exposed surfaces. Morbilliform exanthem.  Neuro: Alert, oriented X3, cranial nerves II-XII grossly intact. No focal findings Psych: Patient is not overtly psychotic, appropriate mood and affect.  Labs on Admission:  Basic Metabolic Panel:  Recent Labs Lab 04/29/15 1644  NA 141  K 4.0  CL 106  CO2 24  GLUCOSE 131*  BUN 41*  CREATININE 1.67*  CALCIUM 9.4   Liver Function Tests: No results for input(s): AST, ALT, ALKPHOS, BILITOT, PROT, ALBUMIN in the last 168 hours. No results for input(s): LIPASE, AMYLASE in the last 168 hours. No results for input(s): AMMONIA in the last 168 hours. CBC:  Recent Labs Lab 04/29/15 1644  WBC 10.1  NEUTROABS 8.4*  HGB 11.9*  HCT 39.2  MCV 75.7*  PLT 249   Cardiac Enzymes: No results for input(s): CKTOTAL, CKMB, CKMBINDEX, TROPONINI in the last 168 hours.  BNP (last 3 results)  Recent Labs  11/23/14 1744 12/26/14 0410 01/10/15 1601  BNP 68.0 359.7* 137.1*    ProBNP (last 3 results) No results for input(s): PROBNP in the last 8760 hours.  CBG: No results for input(s): GLUCAP in the last 168 hours.  Radiological Exams on Admission: Dg Chest 2 View  04/29/2015  CLINICAL DATA:  Shortness of breath.  Leg swelling. EXAM: CHEST  2 VIEW COMPARISON:  02/13/2015.  FINDINGS: The cardiac silhouette remains borderline enlarged. Stable post CABG changes and right subclavian pacer and AICD leads. Stable linear scar in the left lower lung zone. Otherwise, clear lungs. Thoracic spine degenerative changes, including changes of DISH. Cervical spine fixation hardware. IMPRESSION: No acute abnormality. Electronically Signed   By: Claudie Revering M.D.   On: 04/29/2015 15:17    EKG: Independently reviewed.  Abnormal findings:  Ventricular paced rhythm    Assessment/Plan  1. Cellulitis, non-purulent  - Involving lower left leg without purulence, open wound, or appreciable fluctuance  - Continued to worsen despite one wk of outpt doxycycline and Keflex  - No systemic signs  - Received a dose of empiric vancomycin in ED  - Continue treatment with empiric Rocephin for moderate non-purulent disease  - If fails to respond as expected, may require MRSA coverage (though not purulent on admission), imaging for ?underlying abscess  - Trend lactate, procalcitonin  2. Diastolic CHF, chronic  - TTE (11/24/14) EF 0000000, grade 1 diastolic dysfunction  - Appears to be volume-up on exam at time of admission, but bump in SCr and elevated BUN:SCr ration suggests intravascular depletion  - Hold diuretics tonight and plan to resume home-dose torsemide and spironolactone in am  - SLIV, strict I/Os, daily wts, fluid-restrict diet  - Continue ASA 325 qD, statin   3. Allergic drug rash  - Presents with morbilliform exanthem in setting of outpt doxy and Keflex use  - Doxycycline and Keflex have been stopped - There is no mucosal involvement, no vesicles, no pustules - Benadryl prn associated pruritis    4. CAD - Troponin undetectable, EKG with paced rhythm  - Continue ASA 325, high-intensity statin   5. Hypertension  - Currently normotensive  - Holding diuretics tonight as above, planning to resume in am   6. Type II DM  - Has apparently been diet-controlled  - A1c was 6.7% in  October '16, reflecting good control at that time  - Will check CBG qAM  and institute a SSI regimen prn   7. Microcytic anemia  - Hgb 11.9 on admission; her baseline appears to be 9-10; suspect hemoconcentration  - MCV is 75.7; it was previously wnl but appears to be down-trending briskly  - Check iron studies, B12, folate, FOBTs - No sign of active blood loss at this time    8. CKD stage III - SCr 1.67 on admission, up from her apparent baseline of ~1.3  - Suspect this bump is secondary to the acute illness, dehydration  - Holding diuretics tonight   9. Vaginal candidiasis  - Secondary to abx use  - Oral Diflucan 150 mg x1     DVT ppx: SQ Heparin   Code Status: Full code Family Communication: None at bed side.   Disposition Plan: Admit to inpatient   Date of Service 04/29/2015    Vianne Bulls, MD [facility] Pager [phone]  If 7PM-7AM, please contact night-coverage [email or web address] Password [facility] 04/29/2015, 7:48 PM

## 2015-04-29 NOTE — Progress Notes (Signed)
Pharmacy Antibiotic Note  [name] is a 70 y.o. female admitted on 04/29/2015 with cellulitis.  Pharmacy has been consulted for vancomycin dosing.  Plan: Vancomycin 2g IV x 1 then 1250mg  IV every 24 hours.  Goal trough 10-15 mcg/mL.     Temp (24hrs), Avg:98.2 F (36.8 C), Min:98.2 F (36.8 C), Max:98.2 F (36.8 C)   Recent Labs Lab 04/29/15 1644  WBC 10.1  CREATININE 1.67*    Estimated Creatinine Clearance: 38.4 mL/min (by C-G formula based on Cr of 1.67).    Allergies  Allergen Reactions  . Ivp Dye [Iodinated Diagnostic Agents] Shortness Of Breath    No reaction to PO contrast with non-ionic dye.06-25-2014/rsm  . Shellfish-Derived Products Anaphylaxis  . Sulfa Antibiotics Shortness Of Breath  . Iodine Hives  . Zithromax [Azithromycin] Rash    Thank you for allowing pharmacy to be a part of this patient's care.  Adrian Saran, PharmD, BCPS Pager [phone] 04/29/2015 6:03 PM

## 2015-04-29 NOTE — ED Notes (Signed)
1 Month prior pt sprained her ankle after a fall, had imaging done. 1 week ago pt states her left lower leg started swelling, saw her PCP who put her on antibiotics for cellulitis, pt has been taking the antibiotics since Monday with the leg continuing to swell and turn red. Pt states she has been feeling more SOB since this started. Pt states this morning her whole body started itching and broke out into a red rash, she believes from the antibiotics she started Monday.

## 2015-04-29 NOTE — ED Provider Notes (Signed)
CSN: [id number]     Arrival date & time 04/29/15  1408 History  By signing my name below, I, Randa Evens, attest that this documentation has been prepared under the direction and in the presence of [employer], PA-C. Electronically Signed: Randa Evens, ED Scribe. 04/29/2015. 4:32 PM.    Chief Complaint  Patient presents with  . Leg Swelling  . Shortness of Breath  . Rash    Patient is a 70 y.o. female presenting with rash. The history is provided by the patient. No language interpreter was used.  Rash Associated symptoms: shortness of breath (chronic)   Associated symptoms: no abdominal pain, no diarrhea, no fever, no nausea, no sore throat, no throat swelling, no tongue swelling, not vomiting and not wheezing    HPI Comments: [name] is a 70 y.o. female with PMH significant for asthma, CHF, HTN, GERD, CAD, NAFLD, ICD, DM, who presents to the Emergency Department complaining of new generalized itchy rash today. Pt states that she was recently placed on doxycycline and keflex 6 days ago for a lower left leg cellulitis. She states that the antibiotics are not providing any relief of her LLE cellulitis.  Pt also reports some left sided chest pain that began 1 day prior. She states that the pain is radiating around to her left back that is accompanied by SOB.  She also reports a slight cough as well.  However, this appears to be her baseline.  Denies fever, chills, N/V, throat swelling, abdominal pain, or urinary symptoms.  She does state she is experiencing some vaginal burning and itching.  She reports she normally gets yeast infections when she takes antibiotics.   Past Medical History  Diagnosis Date  . Asthma   . Hypertension   . Arthritis   . GERD (gastroesophageal reflux disease)   . Diverticulosis   . Anemia   . CAD (coronary artery disease)   . Fatty liver disease, nonalcoholic   . Internal hemorrhoids   . Gastritis   . Hyperplastic colon polyp   . OSA  (obstructive sleep apnea)   . ICD (implantable cardiac defibrillator) in place   . Shortness of breath   . IBS (irritable bowel syndrome)   . H/O hiatal hernia   . Obesity   . Diabetes mellitus without complication ([place])   . Cellulitis and abscess of foot 12/2014    RT FOOT   Past Surgical History  Procedure Laterality Date  . Pacemaker insertion  2001    BS BivICD  . Cardiac defibrillator placement    . Appendectomy    . Knee surgery      right  . Ankle surgery      right  . Tubal ligation    . Cervical spine surgery      plate   . Coronary angiogram  2010  . Abdominal ultrasound  12/01/2011    Peripelvic cysts- #1- 2.4x1.9x2.3cm, #2-1.2x0.9x1.2cm  . Cardiac catheterization  05/17/1999    No significant coronary obstructive disease w/ mild 20% luminal irregularity of the first diag branch of the LAD  . Cardiac catheterization  07/08/2002    No significant CAD, moderately depressed LV systolic function  . Cardiac catheterization Bilateral 04/26/2007    Normal findings, recommend medical therapy  . Cardiac catheterization  02/18/2008    Moderate CAD, would benefit from having a functional study, recommend continue medical therapy  . Cardiac catheterization  07/23/2012    Medical therapy  . Lexiscan myoview  11/14/2011  Mild-moderate defect seen in Mid Inferolateral and Mid Anterolateral regions-consistant w/ infarct/scar. No significant ischemia demonstrated.  . Transthoracic echocardiogram  07/23/2012    EF 55-60%, normal-mild  . Left heart catheterization with coronary angiogram N/A 07/23/2012    Procedure: LEFT HEART CATHETERIZATION WITH CORONARY ANGIOGRAM;  Surgeon: Leonie Man, MD;  Location: [facility] CATH LAB;  Service: Cardiovascular;  Laterality: N/A;  . Biv pacemaker generator change out N/A 11/02/2012    Procedure: BIV PACEMAKER GENERATOR CHANGE OUT;  Surgeon: Sanda Klein, MD;  Location: [place] CATH LAB;  Service: Cardiovascular;  Laterality: N/A;  . Cardiac catheterization  N/A 11/24/2014    Procedure: Left Heart Cath and Coronary Angiography;  Surgeon: Troy Sine, MD;  Location: [place] CV LAB;  Service: Cardiovascular;  Laterality: N/A;  . Cardiac catheterization  11/27/2014    Procedure: Intravascular Pressure Wire/FFR Study;  Surgeon: Peter M Martinique, MD;  Location: [place] CV LAB;  Service: Cardiovascular;;  . Coronary artery bypass graft N/A 11/29/2014    Procedure: CORONARY ARTERY BYPASS GRAFTING x 5 (LIMA-LAD, SVG-D, SVG-OM1-OM2, SVG-PD);  Surgeon: Melrose Nakayama, MD;  Location: [place];  Service: Open Heart Surgery;  Laterality: N/A;  . Tee without cardioversion N/A 11/29/2014    Procedure: TRANSESOPHAGEAL ECHOCARDIOGRAM (TEE);  Surgeon: Melrose Nakayama, MD;  Location: [place];  Service: Open Heart Surgery;  Laterality: N/A;   Family History  Problem Relation Age of Onset  . Breast cancer Mother   . Diabetes Mother   . Heart disease Maternal Grandmother   . Kidney disease Maternal Grandmother   . Diabetes Maternal Grandmother   . Glaucoma Maternal Aunt   . Heart disease Maternal Aunt   . Asthma Sister    Social History  Substance Use Topics  . Smoking status: Former Smoker -- 0.25 packs/day for 3 years    Types: Cigarettes    Quit date: 02/11/1968  . Smokeless tobacco: Never Used  . Alcohol Use: No   OB History    No data available     Review of Systems  Constitutional: Negative for fever.  HENT: Negative for sore throat and trouble swallowing.   Respiratory: Positive for shortness of breath (chronic). Negative for wheezing.   Cardiovascular: Positive for chest pain (chronic) and leg swelling.  Gastrointestinal: Negative for nausea, vomiting, abdominal pain and diarrhea.  Skin: Positive for rash.  All other systems reviewed and are negative.     Allergies  Ivp dye; Shellfish-derived products; Sulfa antibiotics; Iodine; and Zithromax  Home Medications   Prior to Admission medications   Medication Sig Start Date  End Date Taking? Authorizing Provider  albuterol (PROVENTIL HFA;VENTOLIN HFA) 108 (90 BASE) MCG/ACT inhaler Inhale 2 puffs into the lungs every 4 (four) hours as needed for wheezing (wheezing).    Yes Historical Provider, MD  aspirin EC 325 MG EC tablet Take 1 tablet (325 mg total) by mouth daily. 12/07/14  Yes Coolidge Breeze, PA-C  carvedilol (COREG) 6.25 MG tablet Take 1 tablet (6.25 mg total) by mouth 2 (two) times daily with a meal. 03/19/15  Yes Larey Dresser, MD  cephALEXin (KEFLEX) 500 MG capsule Take 500 mg by mouth 3 (three) times daily.   Yes Historical Provider, MD  cetirizine (ZYRTEC) 10 MG tablet Take 10 mg by mouth daily.    Yes Historical Provider, MD  clotrimazole (MYCELEX) 10 MG troche Take 10 mg by mouth every 4 (four) hours as needed (mouth pain).   Yes Historical Provider, MD  doxycycline (VIBRA-TABS)  100 MG tablet Take 100 mg by mouth 2 (two) times daily.   Yes Historical Provider, MD  fluticasone (FLONASE) 50 MCG/ACT nasal spray Place 1 spray into the nose 2 (two) times daily as needed for rhinitis or allergies.    Yes Historical Provider, MD  ibuprofen (ADVIL,MOTRIN) 200 MG tablet Take 600 mg by mouth every 6 (six) hours as needed for headache, mild pain or moderate pain.    Yes Historical Provider, MD  KLOR-CON M20 20 MEQ tablet TAKE ONE TABLET BY MOUTH ONCE DAILY Patient taking differently: TAKE 20 MEQ BY MOUTH ONCE DAILY 04/05/15  Yes [name], MD  omeprazole (PRILOSEC) 40 MG capsule Take 1 capsule (40 mg total) by mouth daily. 03/30/15  Yes [name], MD  pravastatin (PRAVACHOL) 80 MG tablet Take 1 tablet (80 mg total) by mouth every evening. 10/31/14  Yes [name], MD  spironolactone (ALDACTONE) 25 MG tablet Take 25 mg by mouth daily. 11/10/14  Yes Historical Provider, MD  SYMBICORT 160-4.5 MCG/ACT inhaler Inhale 2 puffs into the lungs 2 (two) times daily. 12/02/13  Yes Historical Provider, MD  torsemide (DEMADEX) 20 MG tablet Take 40 mg by mouth 2 (two)  times daily.   Yes Historical Provider, MD  triamcinolone cream (KENALOG) 0.1 % Apply 1 application topically daily as needed (affected area).  08/24/14  Yes Historical Provider, MD   BP 134/57 mmHg  Pulse 81  Temp(Src) 98.2 F (36.8 C) (Oral)  Resp 18  SpO2 99%   Physical Exam  Constitutional: She is oriented to person, place, and time. She appears well-developed and well-nourished.  Non-toxic appearance. She does not have a sickly appearance. She does not appear ill.  HENT:  Head: Normocephalic and atraumatic.  Mouth/Throat: Oropharynx is clear and moist.  Eyes: Conjunctivae are normal. Pupils are equal, round, and reactive to light.  Neck: Normal range of motion. Neck supple.  Cardiovascular: Normal rate, regular rhythm, normal heart sounds and intact distal pulses.   No murmur heard. Pulses:      Dorsalis pedis pulses are 2+ on the right side, and 2+ on the left side.  2+ pitting edema bilaterally in lower extremities.  LLE may be slightly more edematous than RLE.  Pulmonary/Chest: Effort normal and breath sounds normal. No accessory muscle usage or stridor. No respiratory distress. She has no wheezes. She has no rhonchi. She has no rales.  Abdominal: Soft. Bowel sounds are normal. She exhibits no distension. There is no tenderness.  Genitourinary: Vagina normal. There is no rash, tenderness or lesion on the right labia. There is no rash, tenderness or lesion on the left labia. Cervix exhibits no discharge.  Musculoskeletal: Normal range of motion.  Lymphadenopathy:    She has no cervical adenopathy.  Neurological: She is alert and oriented to person, place, and time.  Speech clear without dysarthria.  Skin: Skin is warm and dry. Rash (generalized.  No evidence of urticaria. ) noted.  LLE TTP with erythema and warmth, no fluctuance or abscess.  No visualization of ulcers bilaterally.  Psychiatric: She has a normal mood and affect. Her behavior is normal.  Nursing note and vitals  reviewed.   ED Course  Procedures (including critical care time) DIAGNOSTIC STUDIES: Oxygen Saturation is 94% on RA, normal by my interpretation.    COORDINATION OF CARE: 4:31 PM-Discussed treatment plan with pt at bedside and pt agreed to plan.     Labs Review Labs Reviewed  CBC WITH DIFFERENTIAL/PLATELET - Abnormal; Notable for the following:  RBC 5.18 (*)    Hemoglobin 11.9 (*)    MCV 75.7 (*)    MCH 23.0 (*)    RDW 18.3 (*)    Neutro Abs 8.4 (*)    All other components within normal limits  BASIC METABOLIC PANEL - Abnormal; Notable for the following:    Glucose, Bld 131 (*)    BUN 41 (*)    Creatinine, Ser 1.67 (*)    GFR calc non Af Amer 30 (*)    GFR calc Af Amer 34 (*)    All other components within normal limits  URINALYSIS, ROUTINE W REFLEX MICROSCOPIC (NOT AT [facility]) - Abnormal; Notable for the following:    Leukocytes, UA SMALL (*)    All other components within normal limits  URINE MICROSCOPIC-ADD ON - Abnormal; Notable for the following:    Squamous Epithelial / LPF 0-5 (*)    All other components within normal limits  WET PREP, GENITAL    Imaging Review Dg Chest 2 View  04/29/2015  CLINICAL DATA:  Shortness of breath.  Leg swelling. EXAM: CHEST  2 VIEW COMPARISON:  02/13/2015. FINDINGS: The cardiac silhouette remains borderline enlarged. Stable post CABG changes and right subclavian pacer and AICD leads. Stable linear scar in the left lower lung zone. Otherwise, clear lungs. Thoracic spine degenerative changes, including changes of DISH. Cervical spine fixation hardware. IMPRESSION: No acute abnormality. Electronically Signed   By: Claudie Revering M.D.   On: 04/29/2015 15:17   I have personally reviewed and evaluated these images and lab results as part of my medical decision-making.   EKG Interpretation   Date/Time:  Sunday April 29 2015 14:36:11 EDT Ventricular Rate:  89 PR Interval:  223 QRS Duration: 143 QT Interval:  413 QTC Calculation: 503 R  Axis:   -133 Text Interpretation:  Atrial-sensed ventricular-paced rhythm No further  analysis attempted due to paced rhythm Confirmed by ZAMMIT  MD, JOSEPH  [phone]) on 04/29/2015 7:35:00 PM      MDM   Final diagnoses:  Cellulitis of left lower extremity   Patient presents with cellulitis of LLE currently on Doxycycline and Keflex.  VSS, NAD.  Patient reports worsening LLE pain, swelling, and redness despite being compliant with abx regimen.  On exam, heart RRR, lungs CTAB, abdomen soft and benign.  LLE erythematous, warm, and tender.  2+ pitting edema bilaterally with LLE slightly more than RLE.  Good pulses.  Strength and sensation intact.    4:30 PM: patient seen by Dr. Roderic Palau  Plan to obtain lower extremity doppler to evaluate for DVT.  Low suspicion for PE, VSS, NAD, CP and SOB along baseline.  WBC 10.1, Cr 1.67 which appears to be her baseline 1.3-1.8.  CXR negative.  EKG without acute changes, paced rhythm. Negative DVT study. UA without signs of infection.  Wet prep negative.  Patient given IV Vanc.  Plan to admit to medicine, Dr. Myna Hidalgo,  for IV abx given failed outpatient PO abx and hx of DM.   I personally performed the services described in this documentation, which was scribed in my presence. The recorded information has been reviewed and is accurate.        Gloriann Loan, PA-C 04/29/15 2002  Milton Ferguson, MD 05/08/15 2119

## 2015-04-29 NOTE — Progress Notes (Signed)
VASCULAR LAB PRELIMINARY  PRELIMINARY  PRELIMINARY  PRELIMINARY  Left lower extremity venous duplex completed.    Preliminary report:  There is no DVT or SVT noted in the left lower extremity.  Interstitial fluid noted throughout calf.  Julissa Browning, RVT 04/29/2015, 6:29 PM

## 2015-04-30 ENCOUNTER — Ambulatory Visit (HOSPITAL_COMMUNITY): Payer: Medicare Other

## 2015-04-30 ENCOUNTER — Inpatient Hospital Stay (HOSPITAL_COMMUNITY): Payer: Medicare Other

## 2015-04-30 LAB — BASIC METABOLIC PANEL
Anion gap: 8 (ref 5–15)
BUN: 44 mg/dL — ABNORMAL HIGH (ref 6–20)
CO2: 25 mmol/L (ref 22–32)
Calcium: 8.7 mg/dL — ABNORMAL LOW (ref 8.9–10.3)
Chloride: 108 mmol/L (ref 101–111)
Creatinine, Ser: 1.65 mg/dL — ABNORMAL HIGH (ref 0.44–1.00)
GFR calc Af Amer: 35 mL/min — ABNORMAL LOW (ref >60)
GFR calc non Af Amer: 30 mL/min — ABNORMAL LOW (ref >60)
Glucose, Bld: 122 mg/dL — ABNORMAL HIGH (ref 65–99)
Potassium: 3.8 mmol/L (ref 3.5–5.1)
Sodium: 141 mmol/L (ref 135–145)

## 2015-04-30 LAB — FERRITIN: Ferritin: 16 ng/mL (ref 11–307)

## 2015-04-30 LAB — HIV ANTIBODY (ROUTINE TESTING W REFLEX): HIV Screen 4th Generation wRfx: NONREACTIVE

## 2015-04-30 LAB — GLUCOSE, CAPILLARY: Glucose-Capillary: 102 mg/dL — ABNORMAL HIGH (ref 65–99)

## 2015-04-30 LAB — IRON AND TIBC
Iron: 23 ug/dL — ABNORMAL LOW (ref 28–170)
Saturation Ratios: 6 % — ABNORMAL LOW (ref 10.4–31.8)
TIBC: 357 ug/dL (ref 250–450)
UIBC: 334 ug/dL

## 2015-04-30 LAB — VITAMIN B12: Vitamin B-12: 248 pg/mL (ref 180–914)

## 2015-04-30 LAB — C-REACTIVE PROTEIN: CRP: 1.8 mg/dL — ABNORMAL HIGH (ref <1.0)

## 2015-04-30 MED ORDER — HYDROXYZINE HCL 10 MG PO TABS
10.0000 mg | ORAL_TABLET | Freq: Once | ORAL | Status: AC
  Administered 2015-04-30: 10 mg via ORAL
  Filled 2015-04-30: qty 1

## 2015-04-30 MED ORDER — HYDROXYZINE HCL 25 MG PO TABS
25.0000 mg | ORAL_TABLET | Freq: Three times a day (TID) | ORAL | Status: DC
  Administered 2015-04-30 – 2015-05-04 (×15): 25 mg via ORAL
  Filled 2015-04-30 (×15): qty 1

## 2015-04-30 MED ORDER — SPIRONOLACTONE 25 MG PO TABS
25.0000 mg | ORAL_TABLET | Freq: Every day | ORAL | Status: DC
  Administered 2015-04-30 – 2015-05-05 (×6): 25 mg via ORAL
  Filled 2015-04-30 (×7): qty 1

## 2015-04-30 MED ORDER — HYDROXYZINE HCL 25 MG PO TABS
25.0000 mg | ORAL_TABLET | Freq: Four times a day (QID) | ORAL | Status: DC | PRN
  Administered 2015-04-30: 25 mg via ORAL
  Filled 2015-04-30: qty 1

## 2015-04-30 MED ORDER — VANCOMYCIN HCL 10 G IV SOLR
1250.0000 mg | INTRAVENOUS | Status: DC
  Administered 2015-04-30 – 2015-05-02 (×3): 1250 mg via INTRAVENOUS
  Filled 2015-04-30 (×3): qty 1250

## 2015-04-30 NOTE — Progress Notes (Signed)
CSW received consult that pt may need assistance with prescriptions.   Inappropriate CSW referral.   Notified RNCM of consult.   CSW signing off.   Please re-consult if social work needs arise.   Alison Murray, MSW, [place] Work [phone]

## 2015-04-30 NOTE — Progress Notes (Addendum)
Pharmacy Antibiotic Note  [name] is a 70 y.o. female admitted on 04/29/2015 with cellulitis.  Pharmacy has been consulted for vancomycin dosing.  Plan: Vancomycin 2g LD given yesterday. Start maintenance dose Vanc 1250 mg IV q24h today.  Goal trough 10-15 mcg/mL.  Height: 5\' 5"  (165.1 cm) Weight: 245 lb 12.8 oz (111.494 kg) IBW/kg (Calculated) : 57  Temp (24hrs), Avg:98 F (36.7 C), Min:97.8 F (36.6 C), Max:98.2 F (36.8 C)   Recent Labs Lab 04/29/15 1644 04/30/15 0412  WBC 10.1  --   CREATININE 1.67* 1.65*    Estimated Creatinine Clearance: 38.9 mL/min (by C-G formula based on Cr of 1.65).    Allergies  Allergen Reactions  . Ivp Dye [Iodinated Diagnostic Agents] Shortness Of Breath    No reaction to PO contrast with non-ionic dye.06-25-2014/rsm  . Shellfish-Derived Products Anaphylaxis  . Sulfa Antibiotics Shortness Of Breath  . Iodine Hives  . Cephalexin Itching and Rash    Rash & itch after Keflex  . Zithromax [Azithromycin] Rash    Antimicrobials this admission: Vanc 3/19 >>  Rocephin 3/19 >> 3/20  Microbiology results: 3/19 Genital wet prep: neg, likely vag yeast infection/Keflex  Thank you for allowing pharmacy to be a part of this patient's care.  Hershal Coria, PharmD, BCPS Pager: [phone] 04/30/2015 11:08 AM

## 2015-04-30 NOTE — Plan of Care (Signed)
Problem: Bowel/Gastric: Goal: Will not experience complications related to bowel motility Outcome: Progressing Pt states having mild constipation. See MAR.

## 2015-04-30 NOTE — Progress Notes (Signed)
[facility] PROGRESS NOTE  [name] [id number] DOB: [birth year] DOA: 04/29/2015 PCP: [name], MD  Assessment/Plan: 1. Cellulitis -Patient presenting with complaints of left ankle erythema, swelling, localized heat, symptoms consistent with cellulitis -She had been treated with doxycycline and Keflex in the outpatient setting having progression of symptoms despite this intervention. Furthermore she reported severe generalized pruritus after initiating antibiotic therapy. -On my evaluation today she complains of severe pruritus and has required Benadryl and Atarax. I'm concerned with the possibility of a cephalosporin allergy for which ceftriaxone will be discontinued. -Will initiate empiric coverage with IV vancomycin  2.  Suspected drug allergy -Since initiating therapy with Keflex and doxycycline she reported development of generalized pruritus. -I'm concerned about a cephalosporin allergy as she continues to complain of severe pruritis requiring benadryl and atarax. I will discontinue ceftriaxone. -Start IV vancomycin for some lattice treatment.   3.  History of diastolic CHF -Last transthoracic echocardiogram performed on 11/24/2014 that showed EF of 55-60% with grade 1 diastolic dysfunction -Appears euvolemic, will monitor volume status  4.  Hypertension -Blood pressure stable, continue Coreg 6.25 mg by mouth twice a day  5.  Dyslipidemia -Continue Pravachol 80 mg by mouth daily  6.  History of coronary artery disease. -She does not appear to have active cardiac issues, denies chest pain. -Continue aspirin, beta blocker and statin     Code Status: Full code Family Communication:  Disposition Plan: Continue IV antibiotic therapy   Antibiotics:  Vancomycin started on 04/30/2015  HPI/Subjective: Discharge is a 70 year old female with a history of diabetes mellitus, hypertension, morbid obesity, admitted to medicine service on 04/29/2015 when she  presented with complaints of left ankle erythema, becoming progressively worse over the past several days. She was diagnosed with several lattice in the outpatient setting and treated with doxycycline and Keflex. Despite this intervention she reported having progression of symptoms. In the interval she has also developed generalized pruritus which she attributes to recent antibiotic therapy. On admission doxycycline and Keflex were discontinued and she was started on IV vancomycin for treatment of colitis.  Objective: Filed Vitals:   04/30/15 1348 04/30/15 1448  BP: 106/51 108/72  Pulse: 85   Temp: 97.8 F (36.6 C)   Resp: 18     Intake/Output Summary (Last 24 hours) at 04/30/15 1513 Last data filed at 04/30/15 1400  Gross per 24 hour  Intake   1530 ml  Output    450 ml  Net   1080 ml   Filed Weights   04/29/15 2031  Weight: 111.494 kg (245 lb 12.8 oz)    Exam:   General:  No acute distress she is awake and alert mentating well nontoxic-appearing  Cardiovascular: Regular rate rhythm normal S1-S2  Respiratory: Normal respiratory effort, lungs are clear to auscultation bilaterally  Abdomen: Soft nontender nondistended  Musculoskeletal: There is erythema involving left ankle, associated localized warmth and edema  Data Reviewed: Basic Metabolic Panel:  Recent Labs Lab 04/29/15 1644 04/30/15 0412  NA 141 141  K 4.0 3.8  CL 106 108  CO2 24 25  GLUCOSE 131* 122*  BUN 41* 44*  CREATININE 1.67* 1.65*  CALCIUM 9.4 8.7*   Liver Function Tests: No results for input(s): AST, ALT, ALKPHOS, BILITOT, PROT, ALBUMIN in the last 168 hours. No results for input(s): LIPASE, AMYLASE in the last 168 hours. No results for input(s): AMMONIA in the last 168 hours. CBC:  Recent Labs Lab 04/29/15 1644  WBC 10.1  NEUTROABS 8.4*  HGB  11.9*  HCT 39.2  MCV 75.7*  PLT 249   Cardiac Enzymes:  Recent Labs Lab 04/29/15 2057  TROPONINI <0.03   BNP (last 3 results)  Recent  Labs  11/23/14 1744 12/26/14 0410 01/10/15 1601  BNP 68.0 359.7* 137.1*    ProBNP (last 3 results) No results for input(s): PROBNP in the last 8760 hours.  CBG:  Recent Labs Lab 04/30/15 0747  GLUCAP 102*    Recent Results (from the past 240 hour(s))  Wet prep, genital     Status: None   Collection Time: 04/29/15  6:33 PM  Result Value Ref Range Status   Yeast Wet Prep HPF POC NONE SEEN NONE SEEN Final   Trich, Wet Prep NONE SEEN NONE SEEN Final   Clue Cells Wet Prep HPF POC NONE SEEN NONE SEEN Final   WBC, Wet Prep HPF POC NONE SEEN NONE SEEN Final   Sperm NONE SEEN  Final     Studies: Dg Chest 2 View  04/29/2015  CLINICAL DATA:  Shortness of breath.  Leg swelling. EXAM: CHEST  2 VIEW COMPARISON:  02/13/2015. FINDINGS: The cardiac silhouette remains borderline enlarged. Stable post CABG changes and right subclavian pacer and AICD leads. Stable linear scar in the left lower lung zone. Otherwise, clear lungs. Thoracic spine degenerative changes, including changes of DISH. Cervical spine fixation hardware. IMPRESSION: No acute abnormality. Electronically Signed   By: Claudie Revering M.D.   On: 04/29/2015 15:17    Scheduled Meds: . aspirin EC  325 mg Oral Daily  . carvedilol  6.25 mg Oral BID WC  . fluticasone  2 spray Each Nare Daily  . heparin  5,000 Units Subcutaneous 3 times per day  . hydrOXYzine  25 mg Oral TID  . loratadine  10 mg Oral Daily  . mometasone-formoterol  2 puff Inhalation BID  . pantoprazole  40 mg Oral Daily  . pravastatin  80 mg Oral QPM  . sodium chloride flush  3 mL Intravenous Q12H  . vancomycin  1,250 mg Intravenous Q24H   Continuous Infusions:   Principal Problem:   Cellulitis Active Problems:   ANEMIA   Essential hypertension   Coronary atherosclerosis   OSA (obstructive sleep apnea)   Diastolic CHF, acute on chronic (HCC)   CAD, multiple vessel   Chronic diastolic heart failure (HCC)   Allergic drug rash due to anti-infective  agent    Time spent: 35 min    Kelvin Cellar  [facility] Pager [phone]. If 7PM-7AM, please contact night-coverage at [email or web address], password [email or web address] 04/30/2015, 3:13 PM  LOS: 1 day

## 2015-05-01 LAB — BASIC METABOLIC PANEL
Anion gap: 6 (ref 5–15)
BUN: 39 mg/dL — ABNORMAL HIGH (ref 6–20)
CO2: 25 mmol/L (ref 22–32)
Calcium: 8.7 mg/dL — ABNORMAL LOW (ref 8.9–10.3)
Chloride: 109 mmol/L (ref 101–111)
Creatinine, Ser: 1.37 mg/dL — ABNORMAL HIGH (ref 0.44–1.00)
GFR calc Af Amer: 44 mL/min — ABNORMAL LOW (ref >60)
GFR calc non Af Amer: 38 mL/min — ABNORMAL LOW (ref >60)
Glucose, Bld: 131 mg/dL — ABNORMAL HIGH (ref 65–99)
Potassium: 3.8 mmol/L (ref 3.5–5.1)
Sodium: 140 mmol/L (ref 135–145)

## 2015-05-01 LAB — FOLATE RBC
Folate, Hemolysate: 435.3 ng/mL
Folate, RBC: 1247 ng/mL (ref >498)
Hematocrit: 34.9 % (ref 34.0–46.6)

## 2015-05-01 LAB — CBC
HCT: 35.6 % — ABNORMAL LOW (ref 36.0–46.0)
Hemoglobin: 10.3 g/dL — ABNORMAL LOW (ref 12.0–15.0)
MCH: 23 pg — ABNORMAL LOW (ref 26.0–34.0)
MCHC: 28.9 g/dL — ABNORMAL LOW (ref 30.0–36.0)
MCV: 79.5 fL (ref 78.0–100.0)
Platelets: 213 10*3/uL (ref 150–400)
RBC: 4.48 MIL/uL (ref 3.87–5.11)
RDW: 18.7 % — ABNORMAL HIGH (ref 11.5–15.5)
WBC: 5.2 10*3/uL (ref 4.0–10.5)

## 2015-05-01 LAB — GLUCOSE, CAPILLARY: Glucose-Capillary: 102 mg/dL — ABNORMAL HIGH (ref 65–99)

## 2015-05-01 LAB — PROCALCITONIN: Procalcitonin: <0.1 ng/mL

## 2015-05-01 MED ORDER — DIPHENHYDRAMINE HCL 50 MG/ML IJ SOLN
12.5000 mg | Freq: Once | INTRAMUSCULAR | Status: AC
  Administered 2015-05-01: 12.5 mg via INTRAVENOUS
  Filled 2015-05-01: qty 1

## 2015-05-01 MED ORDER — TORSEMIDE 20 MG PO TABS
40.0000 mg | ORAL_TABLET | Freq: Two times a day (BID) | ORAL | Status: DC
  Administered 2015-05-01 – 2015-05-05 (×9): 40 mg via ORAL
  Filled 2015-05-01 (×12): qty 2

## 2015-05-01 MED ORDER — GUAIFENESIN ER 600 MG PO TB12
600.0000 mg | ORAL_TABLET | Freq: Two times a day (BID) | ORAL | Status: DC | PRN
  Administered 2015-05-01: 600 mg via ORAL
  Filled 2015-05-01: qty 1

## 2015-05-01 MED ORDER — SALINE SPRAY 0.65 % NA SOLN
1.0000 | NASAL | Status: DC | PRN
  Administered 2015-05-01: 1 via NASAL
  Filled 2015-05-01: qty 44

## 2015-05-01 NOTE — Progress Notes (Signed)
[facility] PROGRESS NOTE  [name] [id number] DOB: 1945-10-20 DOA: 04/29/2015 PCP: [name], MD  Assessment/Plan: 1. Cellulitis -Patient presenting with complaints of left ankle erythema, swelling, localized heat, symptoms consistent with cellulitis -She had been treated with doxycycline and Keflex in the outpatient setting having progression of symptoms despite this intervention. Furthermore she reported severe generalized pruritus after initiating antibiotic therapy. -On my evaluation today she complains of severe pruritus and has required Benadryl and Atarax. I'm concerned with the possibility of a cephalosporin allergy for which ceftriaxone will be discontinued. -Continue empiric coverage with IV vancomycin. On my assessment on 05/01/2015 she is showing clinical improvement.  2.  Suspected drug allergy -Since initiating therapy with Keflex and doxycycline she reported development of generalized pruritus. -I'm concerned about a cephalosporin allergy as she continues to complain of severe pruritis requiring benadryl and atarax. I  discontinued ceftriaxone. -On 05/01/2015 she reports pruritis is improving although still present  -Continue IV vancomycin  3.  History of diastolic CHF -Last transthoracic echocardiogram performed on 11/24/2014 that showed EF of 55-60% with grade 1 diastolic dysfunction -Appears euvolemic, will monitor volume status -Restarted torsemide 40 mg PO BID and spironolactone 25 mg PO q daily  4.  Hypertension -Blood pressure stable, continue Coreg 6.25 mg by mouth twice a day  5.  Dyslipidemia -Continue Pravachol 80 mg by mouth daily  6.  History of coronary artery disease. -She does not appear to have active cardiac issues, denies chest pain. -Continue aspirin, beta blocker and statin     Code Status: Full code Family Communication:  Disposition Plan: Continue IV antibiotic therapy   Antibiotics:  Vancomycin started on  04/30/2015  HPI/Subjective: Discharge is a 70 year old female with a history of diabetes mellitus, hypertension, morbid obesity, admitted to medicine service on 04/29/2015 when she presented with complaints of left ankle erythema, becoming progressively worse over the past several days. She was diagnosed with several lattice in the outpatient setting and treated with doxycycline and Keflex. Despite this intervention she reported having progression of symptoms. In the interval she has also developed generalized pruritus which she attributes to recent antibiotic therapy. On admission doxycycline and Keflex were discontinued and she was started on IV vancomycin for treatment of cellulitis.  Objective: Filed Vitals:   05/01/15 0525 05/01/15 1338  BP: 125/72 125/51  Pulse: 48 76  Temp: 97.7 F (36.5 C) 98 F (36.7 C)  Resp: 16 16    Intake/Output Summary (Last 24 hours) at 05/01/15 1502 Last data filed at 05/01/15 1339  Gross per 24 hour  Intake   1290 ml  Output    225 ml  Net   1065 ml   Filed Weights   04/29/15 2031  Weight: 111.494 kg (245 lb 12.8 oz)    Exam:   General:  No acute distress she is awake and alert mentating well nontoxic-appearing  Cardiovascular: Regular rate rhythm normal S1-S2  Respiratory: Normal respiratory effort, lungs are clear to auscultation bilaterally  Abdomen: Soft nontender nondistended  Musculoskeletal: There is erythema involving left ankle, associated localized warmth and edema  Data Reviewed: Basic Metabolic Panel:  Recent Labs Lab 04/29/15 1644 04/30/15 0412 05/01/15 0449  NA 141 141 140  K 4.0 3.8 3.8  CL 106 108 109  CO2 24 25 25   GLUCOSE 131* 122* 131*  BUN 41* 44* 39*  CREATININE 1.67* 1.65* 1.37*  CALCIUM 9.4 8.7* 8.7*   Liver Function Tests: No results for input(s): AST, ALT, ALKPHOS, BILITOT, PROT, ALBUMIN  in the last 168 hours. No results for input(s): LIPASE, AMYLASE in the last 168 hours. No results for input(s):  AMMONIA in the last 168 hours. CBC:  Recent Labs Lab 04/29/15 1644 05/01/15 0449  WBC 10.1 5.2  NEUTROABS 8.4*  --   HGB 11.9* 10.3*  HCT 39.2 35.6*  MCV 75.7* 79.5  PLT 249 213   Cardiac Enzymes:  Recent Labs Lab 04/29/15 2057  TROPONINI <0.03   BNP (last 3 results)  Recent Labs  11/23/14 1744 12/26/14 0410 01/10/15 1601  BNP 68.0 359.7* 137.1*    ProBNP (last 3 results) No results for input(s): PROBNP in the last 8760 hours.  CBG:  Recent Labs Lab 04/30/15 0747 05/01/15 0742  GLUCAP 102* 102*    Recent Results (from the past 240 hour(s))  Wet prep, genital     Status: None   Collection Time: 04/29/15  6:33 PM  Result Value Ref Range Status   Yeast Wet Prep HPF POC NONE SEEN NONE SEEN Final   Trich, Wet Prep NONE SEEN NONE SEEN Final   Clue Cells Wet Prep HPF POC NONE SEEN NONE SEEN Final   WBC, Wet Prep HPF POC NONE SEEN NONE SEEN Final   Sperm NONE SEEN  Final     Studies: Dg Lumbar Spine 2-3 Views  04/30/2015  CLINICAL DATA:  70 year old female with mid to low back pain (Left greater than right) for the past 2 weeks. History of fall 1 month ago. No recent falls. EXAM: LUMBAR SPINE - 2-3 VIEW COMPARISON:  No priors. FINDINGS: Three views of the lumbar spine demonstrate no definite acute displaced fractures. There is some straightening of normal lumbar lordosis which is likely chronic and related to underlying degenerative disease. Multilevel degenerative disc disease, most severe at T12-L1, L1-L2, L2-L3 and L4-L5. Severe multilevel facet arthropathy, most severe at L4-L5 and L5-S1. Extensive vascular calcifications are noted. IMPRESSION: 1. No acute radiographic abnormality of the lumbar spine. 2. Severe multilevel degenerative disc disease and lumbar spondylosis, as above. 3. Severe atherosclerosis. Electronically Signed   By: Vinnie Langton M.D.   On: 04/30/2015 15:23    Scheduled Meds: . aspirin EC  325 mg Oral Daily  . carvedilol  6.25 mg Oral BID  WC  . fluticasone  2 spray Each Nare Daily  . heparin  5,000 Units Subcutaneous 3 times per day  . hydrOXYzine  25 mg Oral TID  . loratadine  10 mg Oral Daily  . mometasone-formoterol  2 puff Inhalation BID  . pantoprazole  40 mg Oral Daily  . pravastatin  80 mg Oral QPM  . sodium chloride flush  3 mL Intravenous Q12H  . spironolactone  25 mg Oral Daily  . torsemide  40 mg Oral BID  . vancomycin  1,250 mg Intravenous Q24H   Continuous Infusions:   Principal Problem:   Cellulitis Active Problems:   ANEMIA   Essential hypertension   Coronary atherosclerosis   OSA (obstructive sleep apnea)   Diastolic CHF, acute on chronic (HCC)   CAD, multiple vessel   Chronic diastolic heart failure (HCC)   Allergic drug rash due to anti-infective agent    Time spent: 35 min    Kelvin Cellar  [facility] Pager [phone]. If 7PM-7AM, please contact night-coverage at [email or web address], password [facility] 05/01/2015, 3:02 PM  LOS: 2 days

## 2015-05-02 ENCOUNTER — Ambulatory Visit (HOSPITAL_COMMUNITY): Payer: Medicare Other

## 2015-05-02 LAB — BASIC METABOLIC PANEL
Anion gap: 9 (ref 5–15)
BUN: 39 mg/dL — ABNORMAL HIGH (ref 6–20)
CO2: 28 mmol/L (ref 22–32)
Calcium: 8.8 mg/dL — ABNORMAL LOW (ref 8.9–10.3)
Chloride: 102 mmol/L (ref 101–111)
Creatinine, Ser: 1.58 mg/dL — ABNORMAL HIGH (ref 0.44–1.00)
GFR calc Af Amer: 37 mL/min — ABNORMAL LOW (ref >60)
GFR calc non Af Amer: 32 mL/min — ABNORMAL LOW (ref >60)
Glucose, Bld: 113 mg/dL — ABNORMAL HIGH (ref 65–99)
Potassium: 3.6 mmol/L (ref 3.5–5.1)
Sodium: 139 mmol/L (ref 135–145)

## 2015-05-02 LAB — HEPATIC FUNCTION PANEL
ALT: 27 U/L (ref 14–54)
AST: 24 U/L (ref 15–41)
Albumin: 3.1 g/dL — ABNORMAL LOW (ref 3.5–5.0)
Alkaline Phosphatase: 102 U/L (ref 38–126)
Bilirubin, Direct: <0.1 mg/dL — ABNORMAL LOW (ref 0.1–0.5)
Total Bilirubin: 0.5 mg/dL (ref 0.3–1.2)
Total Protein: 6.4 g/dL — ABNORMAL LOW (ref 6.5–8.1)

## 2015-05-02 LAB — CBC
HCT: 37.4 % (ref 36.0–46.0)
Hemoglobin: 10.7 g/dL — ABNORMAL LOW (ref 12.0–15.0)
MCH: 22.5 pg — ABNORMAL LOW (ref 26.0–34.0)
MCHC: 28.6 g/dL — ABNORMAL LOW (ref 30.0–36.0)
MCV: 78.7 fL (ref 78.0–100.0)
Platelets: 235 10*3/uL (ref 150–400)
RBC: 4.75 MIL/uL (ref 3.87–5.11)
RDW: 18.7 % — ABNORMAL HIGH (ref 11.5–15.5)
WBC: 6.7 10*3/uL (ref 4.0–10.5)

## 2015-05-02 LAB — GLUCOSE, CAPILLARY: Glucose-Capillary: 115 mg/dL — ABNORMAL HIGH (ref 65–99)

## 2015-05-02 MED ORDER — DIPHENHYDRAMINE HCL 25 MG PO CAPS
25.0000 mg | ORAL_CAPSULE | Freq: Four times a day (QID) | ORAL | Status: DC | PRN
  Administered 2015-05-02 – 2015-05-04 (×4): 25 mg via ORAL
  Filled 2015-05-02 (×4): qty 1

## 2015-05-02 NOTE — Progress Notes (Signed)
[facility] PROGRESS NOTE  [name] [id number] DOB: [birth year] DOA: 04/29/2015 PCP: [name], MD  Interim Summary [name] is a 70 year old female with a history of diabetes mellitus, hypertension, morbid obesity, admitted to medicine service on 04/29/2015 when she presented with complaints of left ankle erythema, becoming progressively worse over the past several days. She was diagnosed with a left lower extremity cellulitis in the outpatient setting and treated with doxycycline and Keflex. Despite this intervention she reported having progression of symptoms. In the interval she has also developed generalized pruritus which she attributes to recent antibiotic therapy. On admission doxycycline and Keflex were discontinued and she was started on IV vancomycin for treatment of cellulitis.  Assessment/Plan: 1. Cellulitis -Patient presenting with complaints of left ankle erythema, swelling, localized heat, symptoms consistent with cellulitis -She had been treated with doxycycline and Keflex in the outpatient setting having progression of symptoms despite this intervention. Furthermore she reported severe generalized pruritus after initiating antibiotic therapy. -On my evaluation today she complains of severe pruritus and has required Benadryl and Atarax. I'm concerned with the possibility of a cephalosporin allergy for which ceftriaxone will be discontinued. -Continue empiric coverage with IV vancomycin. -On 05/02/2015 erythema involving left foot and ankle looks improved  2.  Suspected drug allergy -Since initiating therapy with Keflex and doxycycline she reported development of generalized pruritus. -I'm concerned about a cephalosporin allergy as she continues to complain of severe pruritis requiring benadryl and atarax. I  discontinued ceftriaxone. -On 05/02/2015 she reports pruritis is improving although still present  -Continue IV vancomycin  3.  History of diastolic  CHF -Last transthoracic echocardiogram performed on 11/24/2014 that showed EF of 55-60% with grade 1 diastolic dysfunction -Appears euvolemic, will monitor volume status -Restarted torsemide 40 mg PO BID and spironolactone 25 mg PO q daily -Will need to monitor her renal function closely  4.  Hypertension -Blood pressure stable, continue Coreg 6.25 mg by mouth twice a day  5.  Dyslipidemia -Continue Pravachol 80 mg by mouth daily  6.  History of coronary artery disease. -She does not appear to have active cardiac issues, denies chest pain. -Continue aspirin, beta blocker and statin  7.  History of stage III chronic kidney disease. -It appears her baseline creatinine near 1.6-1.8 -I restarted torsemide 40 minutes by mouth twice a day and spironolactone 25 mg by mouth daily during this hospitalization given increasing lower extremity edema. -A.m. lab work showing an upward trend in Cr (1.37 to 1.58). -Will continue monitoring of renal function   Code Status: Full code Family Communication:  Disposition Plan: Continue IV antibiotic therapy   Antibiotics:  Vancomycin started on 04/30/2015  HPI/Subjective: She reports ongoing pruritus requiring Benadryl and Atarax. Thinks her foot is getting better  Objective: Filed Vitals:   05/02/15 0522 05/02/15 1438  BP: 97/52 107/79  Pulse: 82 89  Temp: 97.9 F (36.6 C) 98.9 F (37.2 C)  Resp: 16 16    Intake/Output Summary (Last 24 hours) at 05/02/15 1550 Last data filed at 05/02/15 1536  Gross per 24 hour  Intake    183 ml  Output    750 ml  Net   -567 ml   Filed Weights   04/29/15 2031  Weight: 111.494 kg (245 lb 12.8 oz)    Exam:   General:  No acute distress she is awake and alert mentating well nontoxic-appearing  Cardiovascular: Regular rate rhythm normal S1-S2  Respiratory: Normal respiratory effort, lungs are clear to auscultation bilaterally  Abdomen: Soft nontender nondistended  Musculoskeletal: There is  improvement of erythema involving left foot and ankle however there appears to be new area of erythema over her right ankle.  Data Reviewed: Basic Metabolic Panel:  Recent Labs Lab 04/29/15 1644 04/30/15 0412 05/01/15 0449 05/02/15 0422  NA 141 141 140 139  K 4.0 3.8 3.8 3.6  CL 106 108 109 102  CO2 24 25 25 28   GLUCOSE 131* 122* 131* 113*  BUN 41* 44* 39* 39*  CREATININE 1.67* 1.65* 1.37* 1.58*  CALCIUM 9.4 8.7* 8.7* 8.8*   Liver Function Tests:  Recent Labs Lab 05/02/15 0422  AST 24  ALT 27  ALKPHOS 102  BILITOT 0.5  PROT 6.4*  ALBUMIN 3.1*   No results for input(s): LIPASE, AMYLASE in the last 168 hours. No results for input(s): AMMONIA in the last 168 hours. CBC:  Recent Labs Lab 04/29/15 1644 04/29/15 2057 05/01/15 0449 05/02/15 0422  WBC 10.1  --  5.2 6.7  NEUTROABS 8.4*  --   --   --   HGB 11.9*  --  10.3* 10.7*  HCT 39.2 34.9 35.6* 37.4  MCV 75.7*  --  79.5 78.7  PLT 249  --  213 235   Cardiac Enzymes:  Recent Labs Lab 04/29/15 2057  TROPONINI <0.03   BNP (last 3 results)  Recent Labs  11/23/14 1744 12/26/14 0410 01/10/15 1601  BNP 68.0 359.7* 137.1*    ProBNP (last 3 results) No results for input(s): PROBNP in the last 8760 hours.  CBG:  Recent Labs Lab 04/30/15 0747 05/01/15 0742 05/02/15 0802  GLUCAP 102* 102* 115*    Recent Results (from the past 240 hour(s))  Wet prep, genital     Status: None   Collection Time: 04/29/15  6:33 PM  Result Value Ref Range Status   Yeast Wet Prep HPF POC NONE SEEN NONE SEEN Final   Trich, Wet Prep NONE SEEN NONE SEEN Final   Clue Cells Wet Prep HPF POC NONE SEEN NONE SEEN Final   WBC, Wet Prep HPF POC NONE SEEN NONE SEEN Final   Sperm NONE SEEN  Final     Studies: No results found.  Scheduled Meds: . aspirin EC  325 mg Oral Daily  . carvedilol  6.25 mg Oral BID WC  . fluticasone  2 spray Each Nare Daily  . heparin  5,000 Units Subcutaneous 3 times per day  . hydrOXYzine  25 mg  Oral TID  . loratadine  10 mg Oral Daily  . mometasone-formoterol  2 puff Inhalation BID  . pantoprazole  40 mg Oral Daily  . pravastatin  80 mg Oral QPM  . sodium chloride flush  3 mL Intravenous Q12H  . spironolactone  25 mg Oral Daily  . torsemide  40 mg Oral BID  . vancomycin  1,250 mg Intravenous Q24H   Continuous Infusions:   Principal Problem:   Cellulitis Active Problems:   ANEMIA   Essential hypertension   Coronary atherosclerosis   OSA (obstructive sleep apnea)   Diastolic CHF, acute on chronic (HCC)   CAD, multiple vessel   Chronic diastolic heart failure (HCC)   Allergic drug rash due to anti-infective agent    Time spent: 30 min    [name]  [facility] Pager [phone]. If 7PM-7AM, please contact night-coverage at [email or web address], password [facility] 05/02/2015, 3:50 PM  LOS: 3 days

## 2015-05-02 NOTE — Care Management Important Message (Signed)
Important Message  Patient Details  Name: [name] MRN: [id number] Date of Birth: September 07, 1945   Medicare Important Message Given:  Yes    Camillo Flaming 05/02/2015, 9:19 AMImportant Message  Patient Details  Name: [name] MRN: [id number] Date of Birth: 05/20/1945   Medicare Important Message Given:  Yes    Camillo Flaming 05/02/2015, 9:19 AM

## 2015-05-02 NOTE — Evaluation (Signed)
Physical Therapy Evaluation Patient Details Name: [name] MRN: [id number] DOB: 17-Feb-1945 Today's Date: 05/02/2015   History of Present Illness  Patient presenting with complaints of left ankle erythema, swelling, localized heat, symptoms consistent with cellulitis 3/19. was in hospital 4 months ago with cellulitis. Negative for DVT.  Clinical Impression  The patient is very motivated to ambulate but experienced increased pain. Patient will benefit from PT to address the problems listed in the note below.    Follow Up Recommendations No PT follow up    Equipment Recommendations  Rolling walker with 5" wheels (if patient requests at DC)    Recommendations for Other Services       Precautions / Restrictions Precautions Precautions: Fall      Mobility  Bed Mobility               General bed mobility comments: in recliner  Transfers Overall transfer level: Needs assistance Equipment used: Rolling walker (2 wheeled) Transfers: Sit to/from Stand Sit to Stand: Modified independent (Device/Increase time)            Ambulation/Gait Ambulation/Gait assistance: Supervision Ambulation Distance (Feet): 50 Feet Assistive device: Rolling walker (2 wheeled) Gait Pattern/deviations: Wide base of support;Step-to pattern     General Gait Details: patient did not want to use RW but advised her which was necessary due to uncreased leg pain. patient reports ad lib ambulation to BR without AD.  Stairs            Wheelchair Mobility    Modified Rankin (Stroke Patients Only)       Balance                                             Pertinent Vitals/Pain Pain Assessment: 0-10 Pain Score: 7  Pain Location: R leg from foot to thigh Pain Descriptors / Indicators: Aching;Grimacing;Guarding;Jabbing;Spasm;Shooting Pain Intervention(s): Limited activity within patient's tolerance;Patient requesting pain meds-RN notified;Repositioned    Home  Living Family/patient expects to be discharged to:: Private residence Living Arrangements: Children Available Help at Discharge: Available PRN/intermittently;Family Type of Home: House Home Access: Stairs to enter Entrance Stairs-Rails: Psychiatric nurse of Steps: 3  Home Layout: Two level;Able to live on main level with bedroom/bathroom Home Equipment: Walker - 2 wheels      Prior Function Level of Independence: Independent               Hand Dominance   Dominant Hand: Right    Extremity/Trunk Assessment   Upper Extremity Assessment: Overall WFL for tasks assessed           Lower Extremity Assessment: RLE deficits/detail;LLE deficits/detail RLE Deficits / Details: edema of lower legs, demarcated with pen on dorsal foot where reddened area exists LLE Deficits / Details: edema noted,  Cervical / Trunk Assessment: Normal  Communication   Communication: No difficulties  Cognition Arousal/Alertness: Awake/alert Behavior During Therapy: Anxious (due to pain) Overall Cognitive Status: Within Functional Limits for tasks assessed                      General Comments      Exercises        Assessment/Plan    PT Assessment Patient needs continued PT services  PT Diagnosis Difficulty walking;Acute pain   PT Problem List Decreased strength;Decreased range of motion;Decreased activity tolerance;Decreased mobility;Decreased skin integrity;Decreased safety awareness;Pain  PT Treatment Interventions DME instruction;Gait training;Functional mobility training;Therapeutic activities;Therapeutic exercise;Patient/family education   PT Goals (Current goals can be found in the Care Plan section) Acute Rehab PT Goals Patient Stated Goal: i want to walk PT Goal Formulation: With patient Time For Goal Achievement: 05/16/15 Potential to Achieve Goals: Good    Frequency Min 3X/week   Barriers to discharge        Co-evaluation                End of Session   Activity Tolerance: Patient limited by pain Patient left: in chair;with call bell/phone within reach;with nursing/sitter in room Nurse Communication: Mobility status         Time: [id number] PT Time Calculation (min) (ACUTE ONLY): 24 min   Charges:   PT Evaluation $PT Eval Low Complexity: 1 Procedure PT Treatments $Gait Training: 8-22 mins   PT G Codes:        Claretha Cooper 05/02/2015, 4:02 PM Tresa Endo PT [phone]

## 2015-05-03 DIAGNOSIS — G4733 Obstructive sleep apnea (adult) (pediatric): Secondary | ICD-10-CM

## 2015-05-03 DIAGNOSIS — I5033 Acute on chronic diastolic (congestive) heart failure: Secondary | ICD-10-CM

## 2015-05-03 DIAGNOSIS — L270 Generalized skin eruption due to drugs and medicaments taken internally: Secondary | ICD-10-CM

## 2015-05-03 DIAGNOSIS — L03116 Cellulitis of left lower limb: Principal | ICD-10-CM

## 2015-05-03 LAB — CBC
HCT: 35.9 % — ABNORMAL LOW (ref 36.0–46.0)
Hemoglobin: 10.7 g/dL — ABNORMAL LOW (ref 12.0–15.0)
MCH: 22.4 pg — ABNORMAL LOW (ref 26.0–34.0)
MCHC: 29.8 g/dL — ABNORMAL LOW (ref 30.0–36.0)
MCV: 75.1 fL — ABNORMAL LOW (ref 78.0–100.0)
Platelets: 220 10*3/uL (ref 150–400)
RBC: 4.78 MIL/uL (ref 3.87–5.11)
RDW: 18.4 % — ABNORMAL HIGH (ref 11.5–15.5)
WBC: 6.5 10*3/uL (ref 4.0–10.5)

## 2015-05-03 LAB — BASIC METABOLIC PANEL
Anion gap: 11 (ref 5–15)
BUN: 48 mg/dL — ABNORMAL HIGH (ref 6–20)
CO2: 27 mmol/L (ref 22–32)
Calcium: 9.1 mg/dL (ref 8.9–10.3)
Chloride: 100 mmol/L — ABNORMAL LOW (ref 101–111)
Creatinine, Ser: 1.63 mg/dL — ABNORMAL HIGH (ref 0.44–1.00)
GFR calc Af Amer: 36 mL/min — ABNORMAL LOW (ref >60)
GFR calc non Af Amer: 31 mL/min — ABNORMAL LOW (ref >60)
Glucose, Bld: 122 mg/dL — ABNORMAL HIGH (ref 65–99)
Potassium: 3.4 mmol/L — ABNORMAL LOW (ref 3.5–5.1)
Sodium: 138 mmol/L (ref 135–145)

## 2015-05-03 LAB — PROCALCITONIN: Procalcitonin: <0.1 ng/mL

## 2015-05-03 LAB — GLUCOSE, CAPILLARY: Glucose-Capillary: 105 mg/dL — ABNORMAL HIGH (ref 65–99)

## 2015-05-03 MED ORDER — POTASSIUM CHLORIDE CRYS ER 20 MEQ PO TBCR
40.0000 meq | EXTENDED_RELEASE_TABLET | Freq: Once | ORAL | Status: AC
  Administered 2015-05-03: 40 meq via ORAL
  Filled 2015-05-03: qty 2

## 2015-05-03 NOTE — Progress Notes (Addendum)
Physical Therapy Treatment Patient Details Name: CARYOL SHANE MRN: [id number] DOB: Nov 13, 1945 Today's Date: 05/03/2015    History of Present Illness Patient presenting with complaints of left ankle erythema, swelling, localized heat, symptoms consistent with cellulitis 3/19. was in hospital 4 months ago with cellulitis    PT Comments    Pt OOB in recliner feeling better and with less foot pain.  Assisted with amb a greater distance in hallway without an AD.  Mild dyspnea and fatigue limited amb distance, but safe and functional for home level.   Follow Up Recommendations  No PT follow up     Equipment Recommendations  Rolling walker with 5" wheels (stated she has one at home and propb won't use it)    Recommendations for Other Services       Precautions / Restrictions Precautions Precautions: Fall Restrictions Weight Bearing Restrictions: No    Mobility  Bed Mobility               General bed mobility comments: Pt OOB in recliner  Transfers Overall transfer level: Needs assistance Equipment used: Rolling walker (2 wheeled)   Sit to Stand: Supervision         General transfer comment: good safety cognition and use of hands  Ambulation/Gait Ambulation/Gait assistance: Supervision Ambulation Distance (Feet): 85 Feet Assistive device: None Gait Pattern/deviations: Wide base of support Gait velocity: decreased but functional   General Gait Details: amb without any AD this session and did well.  Amb distance limited by mild dyspnea/fatigue.  Otherwise, progressing well.     Stairs    pt pracited 3 stairs using one rail, step to and 25% VC's on proper sequencing going down.  Instructed to go down with her L LE first.          Wheelchair Mobility    Modified Rankin (Stroke Patients Only)       Balance                                    Cognition Arousal/Alertness: Awake/alert Behavior During Therapy: WFL for tasks  assessed/performed Overall Cognitive Status: Within Functional Limits for tasks assessed                      Exercises      General Comments        Pertinent Vitals/Pain Pain Assessment: 0-10 Pain Score: 3  Pain Location: R upper foot and ankle Pain Descriptors / Indicators: Aching;Burning Pain Intervention(s): Monitored during session;Repositioned    Home Living                      Prior Function            PT Goals (current goals can now be found in the care plan section) Progress towards PT goals: Progressing toward goals    Frequency  Min 3X/week    PT Plan Current plan remains appropriate    Co-evaluation             End of Session Equipment Utilized During Treatment: Gait belt Activity Tolerance: Patient tolerated treatment well Patient left: in chair;with call bell/phone within reach     Time: [id number] PT Time Calculation (min) (ACUTE ONLY): 15 min  Charges:  $Gait Training: 8-22 mins                    G Codes:  Rica Koyanagi  PTA [facility]  Acute  Rehab Pager      [phone]

## 2015-05-03 NOTE — Progress Notes (Deleted)
05/03/15 1630: Wound care to right buttock done-cleansed area with skin cleaner and applied Citric Aid Clear. Rectal area cleansed with skin cleaner and Interdry strip applied with 2" overhang left. Donne Hazel, RN

## 2015-05-03 NOTE — Progress Notes (Addendum)
[facility] PROGRESS NOTE  [name] [id number] DOB: July 18, 1945 DOA: 04/29/2015 PCP: [name], MD  Interim Summary [name] is a 70 year old female with a history of diabetes mellitus, hypertension, morbid obesity, admitted to medicine service on 04/29/2015 when she presented with complaints of left ankle erythema, becoming progressively worse over the past several days. She was diagnosed with a left lower extremity cellulitis in the outpatient setting and treated with doxycycline and Keflex. Despite this intervention she reported having progression of symptoms. In the interval she has also developed generalized pruritus which she attributes to recent antibiotic therapy. On admission doxycycline and Keflex were discontinued and she was started on IV vancomycin for treatment of cellulitis.  Assessment/Plan: 1. Cellulitis -no WBC count, no fever, no procalcitonin elevation -d/c abx -duplex negative for DVT  2.  Suspected drug allergy -Since initiating therapy with Keflex and doxycycline she reported development of generalized pruritus.  discontinued ceftriaxone-- ? allergy  3.  History of diastolic CHF -Last transthoracic echocardiogram performed on 11/24/2014 that showed EF of 55-60% with grade 1 diastolic dysfunction -Restarted torsemide 40 mg PO BID and spironolactone 25 mg PO q daily 3/22-- put off >2L yesterday -Will need to monitor her renal function closely Elevate extremitiy -FLUID RESTRICT -get accurate weight on patient  4.  Hypertension -Blood pressure stable, continue Coreg 6.25 mg by mouth twice a day  5.  Dyslipidemia -Continue Pravachol 80 mg by mouth daily  6.  History of coronary artery disease. -She does not appear to have active cardiac issues, denies chest pain. -Continue aspirin, beta blocker and statin  7.  History of stage III chronic kidney disease. -It appears her baseline creatinine near 1.6-1.8 -restarted torsemide 40 minutes  by mouth twice a day and spironolactone 25 mg by mouth daily during this hospitalization given increasing lower extremity edema. -A.m. lab work showing an upward trend in Cr (1.37 to 1.58). -Will continue monitoring of renal function   Code Status: Full code Family Communication:  Disposition Plan: home in AM   Antibiotics:  Vancomycin started on 04/30/2015  HPI/Subjective: Still has some swelling in legs Nursing reports patient had legs dangling  Objective: Filed Vitals:   05/02/15 2105 05/03/15 0517  BP: 112/54 99/44  Pulse: 76 68  Temp: 98.2 F (36.8 C) 98 F (36.7 C)  Resp: 18 16    Intake/Output Summary (Last 24 hours) at 05/03/15 1331 Last data filed at 05/03/15 0518  Gross per 24 hour  Intake      0 ml  Output   1950 ml  Net  -1950 ml   Filed Weights   04/29/15 2031  Weight: 111.494 kg (245 lb 12.8 oz)    Exam:   General:  No acute distress she is awake and alert mentating well nontoxic-appearing  Cardiovascular: Regular rate rhythm normal S1-S2  Respiratory: Normal respiratory effort, lungs are clear to auscultation bilaterally  Abdomen: Soft nontender nondistended Musculoskeletal: +edmea, limited redness- b/l legs involved, pulse intact  Data Reviewed: Basic Metabolic Panel:  Recent Labs Lab 04/29/15 1644 04/30/15 0412 05/01/15 0449 05/02/15 0422 05/03/15 0434  NA 141 141 140 139 138  K 4.0 3.8 3.8 3.6 3.4*  CL 106 108 109 102 100*  CO2 24 25 25 28 27   GLUCOSE 131* 122* 131* 113* 122*  BUN 41* 44* 39* 39* 48*  CREATININE 1.67* 1.65* 1.37* 1.58* 1.63*  CALCIUM 9.4 8.7* 8.7* 8.8* 9.1   Liver Function Tests:  Recent Labs Lab 05/02/15 0422  AST 24  ALT 27  ALKPHOS 102  BILITOT 0.5  PROT 6.4*  ALBUMIN 3.1*   No results for input(s): LIPASE, AMYLASE in the last 168 hours. No results for input(s): AMMONIA in the last 168 hours. CBC:  Recent Labs Lab 04/29/15 1644 04/29/15 2057 05/01/15 0449 05/02/15 0422 05/03/15 0434   WBC 10.1  --  5.2 6.7 6.5  NEUTROABS 8.4*  --   --   --   --   HGB 11.9*  --  10.3* 10.7* 10.7*  HCT 39.2 34.9 35.6* 37.4 35.9*  MCV 75.7*  --  79.5 78.7 75.1*  PLT 249  --  213 235 220   Cardiac Enzymes:  Recent Labs Lab 04/29/15 2057  TROPONINI <0.03   BNP (last 3 results)  Recent Labs  11/23/14 1744 12/26/14 0410 01/10/15 1601  BNP 68.0 359.7* 137.1*    ProBNP (last 3 results) No results for input(s): PROBNP in the last 8760 hours.  CBG:  Recent Labs Lab 04/30/15 0747 05/01/15 0742 05/02/15 0802 05/03/15 0812  GLUCAP 102* 102* 115* 105*    Recent Results (from the past 240 hour(s))  Wet prep, genital     Status: None   Collection Time: 04/29/15  6:33 PM  Result Value Ref Range Status   Yeast Wet Prep HPF POC NONE SEEN NONE SEEN Final   Trich, Wet Prep NONE SEEN NONE SEEN Final   Clue Cells Wet Prep HPF POC NONE SEEN NONE SEEN Final   WBC, Wet Prep HPF POC NONE SEEN NONE SEEN Final   Sperm NONE SEEN  Final     Studies: No results found.  Scheduled Meds: . aspirin EC  325 mg Oral Daily  . carvedilol  6.25 mg Oral BID WC  . fluticasone  2 spray Each Nare Daily  . heparin  5,000 Units Subcutaneous 3 times per day  . hydrOXYzine  25 mg Oral TID  . loratadine  10 mg Oral Daily  . mometasone-formoterol  2 puff Inhalation BID  . pantoprazole  40 mg Oral Daily  . pravastatin  80 mg Oral QPM  . sodium chloride flush  3 mL Intravenous Q12H  . spironolactone  25 mg Oral Daily  . torsemide  40 mg Oral BID   Continuous Infusions:   Principal Problem:   Cellulitis Active Problems:   ANEMIA   Essential hypertension   Coronary atherosclerosis   OSA (obstructive sleep apnea)   Diastolic CHF, acute on chronic (HCC)   CAD, multiple vessel   Chronic diastolic heart failure (HCC)   Allergic drug rash due to anti-infective agent    Time spent: 30 min    [facility] Pager [phone]. If 7PM-7AM, please contact night-coverage at  [email or web address], password [facility] 05/03/2015, 1:31 PM  LOS: 4 days

## 2015-05-04 ENCOUNTER — Inpatient Hospital Stay (HOSPITAL_COMMUNITY): Payer: Medicare Other

## 2015-05-04 ENCOUNTER — Encounter (HOSPITAL_COMMUNITY): Payer: Self-pay | Admitting: Cardiology

## 2015-05-04 ENCOUNTER — Ambulatory Visit (HOSPITAL_COMMUNITY): Payer: Medicare Other

## 2015-05-04 LAB — CBC
HCT: 35.4 % — ABNORMAL LOW (ref 36.0–46.0)
Hemoglobin: 10.7 g/dL — ABNORMAL LOW (ref 12.0–15.0)
MCH: 23.2 pg — ABNORMAL LOW (ref 26.0–34.0)
MCHC: 30.2 g/dL (ref 30.0–36.0)
MCV: 76.6 fL — ABNORMAL LOW (ref 78.0–100.0)
Platelets: 229 10*3/uL (ref 150–400)
RBC: 4.62 MIL/uL (ref 3.87–5.11)
RDW: 18.6 % — ABNORMAL HIGH (ref 11.5–15.5)
WBC: 7.3 10*3/uL (ref 4.0–10.5)

## 2015-05-04 LAB — BASIC METABOLIC PANEL
Anion gap: 10 (ref 5–15)
BUN: 53 mg/dL — ABNORMAL HIGH (ref 6–20)
CO2: 30 mmol/L (ref 22–32)
Calcium: 9.1 mg/dL (ref 8.9–10.3)
Chloride: 99 mmol/L — ABNORMAL LOW (ref 101–111)
Creatinine, Ser: 1.92 mg/dL — ABNORMAL HIGH (ref 0.44–1.00)
GFR calc Af Amer: 29 mL/min — ABNORMAL LOW (ref >60)
GFR calc non Af Amer: 25 mL/min — ABNORMAL LOW (ref >60)
Glucose, Bld: 118 mg/dL — ABNORMAL HIGH (ref 65–99)
Potassium: 3.9 mmol/L (ref 3.5–5.1)
Sodium: 139 mmol/L (ref 135–145)

## 2015-05-04 LAB — GLUCOSE, CAPILLARY: Glucose-Capillary: 104 mg/dL — ABNORMAL HIGH (ref 65–99)

## 2015-05-04 NOTE — Consult Note (Signed)
Reason for Consult: CHF   Referring Physician: Dr. Eliseo Squires   PCP:  Alicia Smolder, MD  Primary Cardiologist:Dr. Croitoru  HF: Dr. Carolin Coy [name] is an 69 y.o. female.    Chief Complaint: Lt leg redness, pruritis, rash admitted 04/29/15   HPI:  70 year old Female with a history of diastolic CHF, biventricular pacemaker, nonobstructive CAD, obesity, hypertension, asthma,sleep apnea, IBS, GERD and anxiety.  In Oct 2016 admitted chest pain and SOB.  No MI but cath revealed 3 vessel CAD but only moderate LAD disease. She was noted to have a small 4.0 cm ascending aneurysm.  An echo done 10/14 showed an EF of 55-60% with grade I diastolic dysfunction.  Underwent CABG with LIMA to LAD; SVG to Diag 1; Seq SVG to OM1-OM2; SVG to PDA.  Also found RV pacer lead through anterior wall of RV into pericardium and was clipped when could not be re-positioned.  EP evaluated and though 5 leads in heart the pacer was working so would be followed as outpt.   Readmitted 12/21/14 with cellulitis of RLE and volume overload and was seen by Dr. Haroldine Laws and IV lasix started.   Her output picked up and she improved. Discharged with torsemide and diamox. She has followed in HF clinic since.  Last visit was 04/25/15 with increased edema and her diuretics were increased, for 2 days.  She was on 2 ABX for leg cellulitis- Lt lower leg.    By the 19th her Lt lower leg was with increased swelling.  She was seen in ER and admitted. No DVT of lt leg. She was volume overloaded but thought to be intravascularly depleted. Diuretics held.  Also with drug rash from Keflex or doxycycline.   Now back on diuretics but BP low.  No chest pain and neg. Troponin.  CKD-3 with baseline cr. 1.6-1.8.   I&O not accurate but what we have is -872 since admit.   Wt 244.9 down from 245 on admit but still 5 lbs over dry wt. Dry wt 237-240.  Cr. up slightly.  Mild anemia.  EKG a sensing, V pacing.   Last Echo TEE was post CABG  in Oct. 2016 with EF > 01%, diastolic dysfunction.  Mild AI, mild MR, mild TR.  TTE: Echo 11/24/14 EF 55-60%, Grade 1 DD. Mild TR  Currently no SOB, she has chronic episodic chest discomfort mid sternal that increases with palpation.  Prior to admit she had been doing fairly well until fall, slipped on water in kitchen.     Past Medical History  Diagnosis Date  . Asthma   . Hypertension   . Arthritis   . GERD (gastroesophageal reflux disease)   . Diverticulosis   . Anemia   . CAD (coronary artery disease)   . Fatty liver disease, nonalcoholic   . Internal hemorrhoids   . Gastritis   . Hyperplastic colon polyp   . OSA (obstructive sleep apnea)   . ICD (implantable cardiac defibrillator) in place   . Shortness of breath   . IBS (irritable bowel syndrome)   . H/O hiatal hernia   . Obesity   . Diabetes mellitus without complication ([name])   . Cellulitis and abscess of foot 12/2014    RT FOOT    Past Surgical History  Procedure Laterality Date  . Pacemaker insertion  2001    BS BivICD  . Cardiac defibrillator placement    . Appendectomy    .  Knee surgery      right  . Ankle surgery      right  . Tubal ligation    . Cervical spine surgery      plate   . Coronary angiogram  2010  . Abdominal ultrasound  12/01/2011    Peripelvic cysts- #1- 2.4x1.9x2.3cm, #2-1.2x0.9x1.2cm  . Cardiac catheterization  05/17/1999    No significant coronary obstructive disease w/ mild 20% luminal irregularity of the first diag branch of the LAD  . Cardiac catheterization  07/08/2002    No significant CAD, moderately depressed LV systolic function  . Cardiac catheterization Bilateral 04/26/2007    Normal findings, recommend medical therapy  . Cardiac catheterization  02/18/2008    Moderate CAD, would benefit from having a functional study, recommend continue medical therapy  . Cardiac catheterization  07/23/2012    Medical therapy  . Lexiscan myoview  11/14/2011    Mild-moderate defect seen in Mid  Inferolateral and Mid Anterolateral regions-consistant w/ infarct/scar. No significant ischemia demonstrated.  . Transthoracic echocardiogram  07/23/2012    EF 55-60%, normal-mild  . Left heart catheterization with coronary angiogram N/A 07/23/2012    Procedure: LEFT HEART CATHETERIZATION WITH CORONARY ANGIOGRAM;  Surgeon: Leonie Man, MD;  Location: [facility] CATH LAB;  Service: Cardiovascular;  Laterality: N/A;  . Biv pacemaker generator change out N/A 11/02/2012    Procedure: BIV PACEMAKER GENERATOR CHANGE OUT;  Surgeon: Sanda Klein, MD;  Location: [place] CATH LAB;  Service: Cardiovascular;  Laterality: N/A;  . Cardiac catheterization N/A 11/24/2014    Procedure: Left Heart Cath and Coronary Angiography;  Surgeon: Troy Sine, MD;  Location: [place] CV LAB;  Service: Cardiovascular;  Laterality: N/A;  . Cardiac catheterization  11/27/2014    Procedure: Intravascular Pressure Wire/FFR Study;  Surgeon: Peter M Martinique, MD;  Location: [place] CV LAB;  Service: Cardiovascular;;  . Coronary artery bypass graft N/A 11/29/2014    Procedure: CORONARY ARTERY BYPASS GRAFTING x 5 (LIMA-LAD, SVG-D, SVG-OM1-OM2, SVG-PD);  Surgeon: Melrose Nakayama, MD;  Location: [place];  Service: Open Heart Surgery;  Laterality: N/A;  . Tee without cardioversion N/A 11/29/2014    Procedure: TRANSESOPHAGEAL ECHOCARDIOGRAM (TEE);  Surgeon: Melrose Nakayama, MD;  Location: [place];  Service: Open Heart Surgery;  Laterality: N/A;    Family History  Problem Relation Age of Onset  . Breast cancer Mother   . Diabetes Mother   . Heart disease Maternal Grandmother   . Kidney disease Maternal Grandmother   . Diabetes Maternal Grandmother   . Glaucoma Maternal Aunt   . Heart disease Maternal Aunt   . Asthma Sister    Social History:  reports that she quit smoking about 47 years ago. Her smoking use included Cigarettes. She has a .75 pack-year smoking history. She has never used smokeless tobacco. She reports that she  does not drink alcohol or use illicit drugs.  Allergies:  Allergies  Allergen Reactions  . Ivp Dye [Iodinated Diagnostic Agents] Shortness Of Breath    No reaction to PO contrast with non-ionic dye.06-25-2014/rsm  . Shellfish-Derived Products Anaphylaxis  . Sulfa Antibiotics Shortness Of Breath  . Iodine Hives  . Cephalexin Itching and Rash    Rash & itch after Keflex  . Zithromax [Azithromycin] Rash    OUTPATIENT MEDICATIONS: No current facility-administered medications on file prior to encounter.   Current Outpatient Prescriptions on File Prior to Encounter  Medication Sig Dispense Refill  . albuterol (PROVENTIL HFA;VENTOLIN HFA) 108 (90 BASE) MCG/ACT inhaler Inhale  2 puffs into the lungs every 4 (four) hours as needed for wheezing (wheezing).     [name] aspirin EC 325 MG EC tablet Take 1 tablet (325 mg total) by mouth daily. 30 tablet 0  . carvedilol (COREG) 6.25 MG tablet Take 1 tablet (6.25 mg total) by mouth 2 (two) times daily with a meal. 60 tablet 3  . cephALEXin (KEFLEX) 500 MG capsule Take 500 mg by mouth 3 (three) times daily.    . cetirizine (ZYRTEC) 10 MG tablet Take 10 mg by mouth daily.     . clotrimazole (MYCELEX) 10 MG troche Take 10 mg by mouth every 4 (four) hours as needed (mouth pain).    [name] doxycycline (VIBRA-TABS) 100 MG tablet Take 100 mg by mouth 2 (two) times daily.    . fluticasone (FLONASE) 50 MCG/ACT nasal spray Place 1 spray into the nose 2 (two) times daily as needed for rhinitis or allergies.     [name] ibuprofen (ADVIL,MOTRIN) 200 MG tablet Take 600 mg by mouth every 6 (six) hours as needed for headache, mild pain or moderate pain.     [name] KLOR-CON M20 20 MEQ tablet TAKE ONE TABLET BY MOUTH ONCE DAILY (Patient taking differently: TAKE 20 MEQ BY MOUTH ONCE DAILY) 60 tablet 4  . omeprazole (PRILOSEC) 40 MG capsule Take 1 capsule (40 mg total) by mouth daily. 30 capsule 11  . pravastatin (PRAVACHOL) 80 MG tablet Take 1 tablet (80 mg total) by mouth every evening. 90  tablet 2  . spironolactone (ALDACTONE) 25 MG tablet Take 25 mg by mouth daily.    . SYMBICORT 160-4.5 MCG/ACT inhaler Inhale 2 puffs into the lungs 2 (two) times daily.    [name] torsemide (DEMADEX) 20 MG tablet Take 40 mg by mouth 2 (two) times daily.    [name] triamcinolone cream (KENALOG) 0.1 % Apply 1 application topically daily as needed (affected area).      CURRENT MEDICATIONS: Scheduled Meds: . aspirin EC  325 mg Oral Daily  . carvedilol  6.25 mg Oral BID WC  . fluticasone  2 spray Each Nare Daily  . heparin  5,000 Units Subcutaneous 3 times per day  . hydrOXYzine  25 mg Oral TID  . loratadine  10 mg Oral Daily  . mometasone-formoterol  2 puff Inhalation BID  . pantoprazole  40 mg Oral Daily  . pravastatin  80 mg Oral QPM  . sodium chloride flush  3 mL Intravenous Q12H  . spironolactone  25 mg Oral Daily  . torsemide  40 mg Oral BID   Continuous Infusions:  PRN Meds:.sodium chloride, acetaminophen **OR** acetaminophen, albuterol, bisacodyl, clotrimazole, diphenhydrAMINE, guaiFENesin, hydrALAZINE, HYDROcodone-acetaminophen, ondansetron **OR** ondansetron (ZOFRAN) IV, polyethylene glycol, sodium chloride, sodium chloride flush     Results for orders placed or performed during the hospital encounter of 04/29/15 (from the past 48 hour(s))  Procalcitonin     Status: None   Collection Time: 05/03/15  4:34 AM  Result Value Ref Range   Procalcitonin <0.10 ng/mL    Comment:        Interpretation: PCT (Procalcitonin) <= 0.5 ng/mL: Systemic infection (sepsis) is not likely. Local bacterial infection is possible. (NOTE)         ICU PCT Algorithm               Non ICU PCT Algorithm    ----------------------------     ------------------------------         PCT < 0.25 ng/mL  PCT < 0.1 ng/mL     Stopping of antibiotics            Stopping of antibiotics       strongly encouraged.               strongly encouraged.    ----------------------------      ------------------------------       PCT level decrease by               PCT < 0.25 ng/mL       >= 80% from peak PCT       OR PCT 0.25 - 0.5 ng/mL          Stopping of antibiotics                                             encouraged.     Stopping of antibiotics           encouraged.    ----------------------------     ------------------------------       PCT level decrease by              PCT >= 0.25 ng/mL       < 80% from peak PCT        AND PCT >= 0.5 ng/mL            Continuin g antibiotics                                              encouraged.       Continuing antibiotics            encouraged.    ----------------------------     ------------------------------     PCT level increase compared          PCT > 0.5 ng/mL         with peak PCT AND          PCT >= 0.5 ng/mL             Escalation of antibiotics                                          strongly encouraged.      Escalation of antibiotics        strongly encouraged.   CBC     Status: Abnormal   Collection Time: 05/03/15  4:34 AM  Result Value Ref Range   WBC 6.5 4.0 - 10.5 K/uL   RBC 4.78 3.87 - 5.11 MIL/uL   Hemoglobin 10.7 (L) 12.0 - 15.0 g/dL   HCT 35.9 (L) 36.0 - 46.0 %   MCV 75.1 (L) 78.0 - 100.0 fL   MCH 22.4 (L) 26.0 - 34.0 pg   MCHC 29.8 (L) 30.0 - 36.0 g/dL   RDW 18.4 (H) 11.5 - 15.5 %   Platelets 220 150 - 400 K/uL  Basic metabolic panel     Status: Abnormal   Collection Time: 05/03/15  4:34 AM  Result Value Ref Range   Sodium 138 135 - 145 mmol/L   Potassium 3.4 (L) 3.5 - 5.1 mmol/L   Chloride 100 (L) 101 -  111 mmol/L   CO2 27 22 - 32 mmol/L   Glucose, Bld 122 (H) 65 - 99 mg/dL   BUN 48 (H) 6 - 20 mg/dL   Creatinine, Ser 1.63 (H) 0.44 - 1.00 mg/dL   Calcium 9.1 8.9 - 10.3 mg/dL   GFR calc non Af Amer 31 (L) >60 mL/min   GFR calc Af Amer 36 (L) >60 mL/min    Comment: (NOTE) The eGFR has been calculated using the CKD EPI equation. This calculation has not been validated in all clinical  situations. eGFR's persistently <60 mL/min signify possible Chronic Kidney Disease.    Anion gap 11 5 - 15  Glucose, capillary     Status: Abnormal   Collection Time: 05/03/15  8:12 AM  Result Value Ref Range   Glucose-Capillary 105 (H) 65 - 99 mg/dL  CBC     Status: Abnormal   Collection Time: 05/04/15  4:20 AM  Result Value Ref Range   WBC 7.3 4.0 - 10.5 K/uL   RBC 4.62 3.87 - 5.11 MIL/uL   Hemoglobin 10.7 (L) 12.0 - 15.0 g/dL   HCT 35.4 (L) 36.0 - 46.0 %   MCV 76.6 (L) 78.0 - 100.0 fL   MCH 23.2 (L) 26.0 - 34.0 pg   MCHC 30.2 30.0 - 36.0 g/dL   RDW 18.6 (H) 11.5 - 15.5 %   Platelets 229 150 - 400 K/uL  Basic metabolic panel     Status: Abnormal   Collection Time: 05/04/15  4:20 AM  Result Value Ref Range   Sodium 139 135 - 145 mmol/L   Potassium 3.9 3.5 - 5.1 mmol/L   Chloride 99 (L) 101 - 111 mmol/L   CO2 30 22 - 32 mmol/L   Glucose, Bld 118 (H) 65 - 99 mg/dL   BUN 53 (H) 6 - 20 mg/dL   Creatinine, Ser 1.92 (H) 0.44 - 1.00 mg/dL   Calcium 9.1 8.9 - 10.3 mg/dL   GFR calc non Af Amer 25 (L) >60 mL/min   GFR calc Af Amer 29 (L) >60 mL/min    Comment: (NOTE) The eGFR has been calculated using the CKD EPI equation. This calculation has not been validated in all clinical situations. eGFR's persistently <60 mL/min signify possible Chronic Kidney Disease.    Anion gap 10 5 - 15  Glucose, capillary     Status: Abnormal   Collection Time: 05/04/15  7:50 AM  Result Value Ref Range   Glucose-Capillary 104 (H) 65 - 99 mg/dL   No results found.  ROS: General:no colds or fevers, increase of weight  Skin:+ rashes or ulcers HEENT:no blurred vision, no congestion CV:see HPI PUL:see HPI GI:no diarrhea constipation or melena, no indigestion GU:no hematuria, no dysuria MS:no joint pain, no claudication Neuro:no syncope, no lightheadedness Endo:no diabetes, no thyroid disease   Blood pressure 96/55, pulse 69, temperature 97.8 F (36.6 C), temperature source Oral, resp. rate  20, height _0  (1.651 m), weight 244 lb 9 oz (110.933 kg), SpO2 100 %.  Wt Readings from Last 3 Encounters:  05/04/15 244 lb 9 oz (110.933 kg)  04/25/15 245 lb 12.8 oz (111.494 kg)  03/30/15 246 lb (111.585 kg)    PE: General:Pleasant affect, NAD Skin:Warm and dry, brisk capillary refill HEENT:normocephalic, sclera clear, mucus membranes moist Neck:supple, no JVD, no bruits  Heart:S1S2 RRR without murmur, gallup, rub or click Lungs:clear without rales, rhonchi, or wheezes IOX:BDZHG, soft, non tender, + BS, do not palpate liver spleen or masses Ext:+ lt lower  ext edema especially at ankle, Rt lower ext tr edema, 2+ pedal pulses, 2+ radial pulses, rash present on lower extremities.  Neuro:alert and oriented X 3, MAE, follows commands, + facial symmetry    Assessment/Plan Principal Problem:   Cellulitis Active Problems:   ANEMIA   Essential hypertension   Coronary atherosclerosis   OSA (obstructive sleep apnea)   Diastolic CHF, acute on chronic (HCC)   CAD, multiple vessel   Chronic diastolic heart failure (HCC)   Allergic drug rash due to anti-infective agent  Acute on chronic diastolic HF- lungs clear, does have some lower ext edema, wt up about 5 lbs from her dry wt. Dr. Irish Lack to see.last EF 65%  CAD s/P CABG 11/2014 has chest wall pain and has had since surgery increases with palpation, troponin neg, EKG pacing.   OSA does not tolerate CPAP  BiV ICD placed in 2001 and new gen 2014 followed by Dr. [name] now with lead Issues to see Dr. [name] 05/08/15 if still hospitalized will need to reschedule.  Lt ankle injury no fx on film, probable sprain.    [place]  Nurse Practitioner Certified [place] Pager [phone] or after 5pm or weekends call [phone] 05/04/2015, 12:01 PM    I have examined the patient and reviewed assessment and plan and discussed with patient.  Agree with above as stated.  Appears euvolemic.  Continue home  diuretics.   VARANASI,JAYADEEP S.

## 2015-05-04 NOTE — Progress Notes (Signed)
[facility] PROGRESS NOTE  [name] [id number] DOB: 11/22/1945 DOA: 04/29/2015 PCP: [name], MD  Interim Summary [name] is a 70 year old female with a history of diabetes mellitus, hypertension, morbid obesity, admitted to medicine service on 04/29/2015 when she presented with complaints of left ankle erythema, becoming progressively worse over the past several days. She was diagnosed with a left lower extremity cellulitis in the outpatient setting and treated with doxycycline and Keflex. Despite this intervention she reported having progression of symptoms. In the interval she has also developed generalized pruritus which she attributes to recent antibiotic therapy. On admission doxycycline and Keflex were discontinued and she was started on IV vancomycin for treatment of cellulitis.  Assessment/Plan: Cellulitis -no WBC count, no fever, no procalcitonin elevation -d/c abx -duplex negative for DVT Left leg > swelling than right but recent sprain per patient -x ray  Suspected drug allergy -Since initiating therapy with Keflex and doxycycline she reported development of generalized pruritus.  discontinued ceftriaxone-- ? allergy  History of diastolic CHF- appears volume overloaded -Last transthoracic echocardiogram performed on 11/24/2014 that showed EF of 55-60% with grade 1 diastolic dysfunction -Restarted torsemide 40 mg PO BID and spironolactone 25 mg PO q daily 3/22-- put off >2L  -Will need to monitor her renal function closely Elevate extremitiy -FLUID RESTRICT -get accurate weight on patient -consult cardiology as suspect patient may need more diuresis but has low BP  Hypertension -Blood pressure on the low side, continue Coreg 6.25 mg by mouth twice a day   Dyslipidemia -Continue Pravachol 80 mg by mouth daily   History of coronary artery disease. -She does not appear to have active cardiac issues, denies chest pain. -Continue aspirin, beta  blocker and statin   History of stage III chronic kidney disease. -It appears her baseline creatinine near 1.6-1.8 -restarted torsemide 40 minutes by mouth twice a day and spironolactone 25 mg by mouth daily during this hospitalization given increasing lower extremity edema. -Will continue monitoring of renal function   Code Status: Full code Family Communication:  Disposition Plan: home in AM   Antibiotics:  Vancomycin started on 04/30/2015  HPI/Subjective: No SOB but sleeping in upright position in recliner  Objective: Filed Vitals:   05/03/15 1340 05/04/15 0614  BP: 108/64 96/55  Pulse: 76 69  Temp: 98 F (36.7 C) 97.8 F (36.6 C)  Resp: 16 20    Intake/Output Summary (Last 24 hours) at 05/04/15 1212 Last data filed at 05/04/15 0912  Gross per 24 hour  Intake    600 ml  Output    650 ml  Net    -50 ml   Filed Weights   04/29/15 2031 05/03/15 1849 05/04/15 0500  Weight: 111.494 kg (245 lb 12.8 oz) 107.593 kg (237 lb 3.2 oz) 110.933 kg (244 lb 9 oz)    Exam:   General:  No acute distress she is awake and alert mentating well nontoxic-appearing  Cardiovascular: Regular rate rhythm normal S1-S2  Respiratory: Normal respiratory effort, lungs are clear to auscultation bilaterally  Abdomen: Soft nontender nondistended Musculoskeletal: +edmea L> R, limited redness- b/l legs involved, pulse intact  Data Reviewed: Basic Metabolic Panel:  Recent Labs Lab 04/30/15 0412 05/01/15 0449 05/02/15 0422 05/03/15 0434 05/04/15 0420  NA 141 140 139 138 139  K 3.8 3.8 3.6 3.4* 3.9  CL 108 109 102 100* 99*  CO2 25 25 28 27 30   GLUCOSE 122* 131* 113* 122* 118*  BUN 44* 39* 39* 48* 53*  CREATININE  1.65* 1.37* 1.58* 1.63* 1.92*  CALCIUM 8.7* 8.7* 8.8* 9.1 9.1   Liver Function Tests:  Recent Labs Lab 05/02/15 0422  AST 24  ALT 27  ALKPHOS 102  BILITOT 0.5  PROT 6.4*  ALBUMIN 3.1*   No results for input(s): LIPASE, AMYLASE in the last 168 hours. No  results for input(s): AMMONIA in the last 168 hours. CBC:  Recent Labs Lab 04/29/15 1644 04/29/15 2057 05/01/15 0449 05/02/15 0422 05/03/15 0434 05/04/15 0420  WBC 10.1  --  5.2 6.7 6.5 7.3  NEUTROABS 8.4*  --   --   --   --   --   HGB 11.9*  --  10.3* 10.7* 10.7* 10.7*  HCT 39.2 34.9 35.6* 37.4 35.9* 35.4*  MCV 75.7*  --  79.5 78.7 75.1* 76.6*  PLT 249  --  213 235 220 229   Cardiac Enzymes:  Recent Labs Lab 04/29/15 2057  TROPONINI <0.03   BNP (last 3 results)  Recent Labs  11/23/14 1744 12/26/14 0410 01/10/15 1601  BNP 68.0 359.7* 137.1*    ProBNP (last 3 results) No results for input(s): PROBNP in the last 8760 hours.  CBG:  Recent Labs Lab 04/30/15 0747 05/01/15 0742 05/02/15 0802 05/03/15 0812 05/04/15 0750  GLUCAP 102* 102* 115* 105* 104*    Recent Results (from the past 240 hour(s))  Wet prep, genital     Status: None   Collection Time: 04/29/15  6:33 PM  Result Value Ref Range Status   Yeast Wet Prep HPF POC NONE SEEN NONE SEEN Final   Trich, Wet Prep NONE SEEN NONE SEEN Final   Clue Cells Wet Prep HPF POC NONE SEEN NONE SEEN Final   WBC, Wet Prep HPF POC NONE SEEN NONE SEEN Final   Sperm NONE SEEN  Final     Studies: No results found.  Scheduled Meds: . aspirin EC  325 mg Oral Daily  . carvedilol  6.25 mg Oral BID WC  . fluticasone  2 spray Each Nare Daily  . heparin  5,000 Units Subcutaneous 3 times per day  . hydrOXYzine  25 mg Oral TID  . loratadine  10 mg Oral Daily  . mometasone-formoterol  2 puff Inhalation BID  . pantoprazole  40 mg Oral Daily  . pravastatin  80 mg Oral QPM  . sodium chloride flush  3 mL Intravenous Q12H  . spironolactone  25 mg Oral Daily  . torsemide  40 mg Oral BID   Continuous Infusions:   Principal Problem:   Cellulitis Active Problems:   ANEMIA   Essential hypertension   Coronary atherosclerosis   OSA (obstructive sleep apnea)   Diastolic CHF, acute on chronic (HCC)   CAD, multiple vessel    Chronic diastolic heart failure (HCC)   Allergic drug rash due to anti-infective agent    Time spent: 25 min    [facility] Pager [phone]. If 7PM-7AM, please contact night-coverage at [email or web address], password [facility] 05/04/2015, 12:12 PM  LOS: 5 days

## 2015-05-05 DIAGNOSIS — D649 Anemia, unspecified: Secondary | ICD-10-CM

## 2015-05-05 DIAGNOSIS — I10 Essential (primary) hypertension: Secondary | ICD-10-CM

## 2015-05-05 DIAGNOSIS — I5032 Chronic diastolic (congestive) heart failure: Secondary | ICD-10-CM

## 2015-05-05 LAB — GLUCOSE, CAPILLARY: Glucose-Capillary: 112 mg/dL — ABNORMAL HIGH (ref 65–99)

## 2015-05-05 MED ORDER — HYDROCODONE-ACETAMINOPHEN 5-325 MG PO TABS: 1.0000 | ORAL_TABLET | ORAL | Status: DC | PRN

## 2015-05-05 NOTE — Discharge Summary (Signed)
Physician Discharge Summary  [name] [id number] DOB: 10/12/1945 DOA: 04/29/2015  PCP: [name], MD  Admit date: 04/29/2015 Discharge date: 05/05/2015  Time spent: 35 minutes  Recommendations for Outpatient Follow-up:  1. BMP 1 week 2. RICE for left ankle   Discharge Diagnoses:  Principal Problem:   Cellulitis Active Problems:   ANEMIA   Essential hypertension   Coronary atherosclerosis   OSA (obstructive sleep apnea)   Diastolic CHF, acute on chronic (HCC)   CAD, multiple vessel   Chronic diastolic heart failure (HCC)   Allergic drug rash due to anti-infective agent   Discharge Condition: improved  Diet recommendation: cardiac with fluid restriction  Filed Weights   05/03/15 1849 05/04/15 0500 05/05/15 0720  Weight: 107.593 kg (237 lb 3.2 oz) 110.933 kg (244 lb 9 oz) 110.36 kg (243 lb 4.8 oz)    History of present illness:  [name] is a 70 y.o. female with PMH of type 2 diabetes mellitus, hypertension, NASH, morbid obesity, and CAD status post 5 vessel CABG in October 2016 who presents to the ED with progressive swelling, redness, and pain at the lower left leg despite a course of outpatient antibiotics. Patient sought evaluation from her PCP on 04/23/2015 for left lower extremity swelling and erythema. She was diagnosed with cellulitis at that time and given a course of doxycycline and Keflex. She reports close adherence to the prescribed regimen, but notes that the lower extremity symptoms have progressed. Also over this interval, she has developed a generalized erythematous and pruritic rash. She denies any fevers, but endorses chills. There's been no lightheadedness, palpitations, or change in urination. She describes a lower extremity pain as severe, better with rest, worse with any movement or palpation, and achy in character. Patient also notes development of vulvar erythema and pruritus since starting antibiotics. She reports a scant white  discharge but no foul odor.  In ED, patient was found to be afebrile, saturating well on room air, and with vital signs stable. Blood work is notable for a serum creatinine of 1.67, up from an apparent baseline of 1.3. BUN-to-serum creatinine ratio is 25. There is a microcytic anemia with hemoglobin of 11.9, up from an apparent baseline of 9-10. MCV is 75.7 and previously been within the normal range. In the emergency department, empiric vancomycin was initiated for treatment of suspected cellulitis that is felt outpatient therapy. Acetaminophen was given 1 for pain, and Benadryl administered for itch associated with generalized rash. Wet prep was performed and remains pending. Venous ultrasound the left lower extremity was performed and preliminary read is negative for DVT. Patient remained hemodynamically stable in the ED and will be admitted for ongoing evaluation and management of suspected left lower extremity cellulitis with failure of outpatient therapy.    Hospital Course:  Cellulitis -no WBC count, no fever, no procalcitonin elevation -d/c abx -duplex negative for DVT Left leg > swelling than right but recent sprain per patient -x ray negative RICE  Suspected drug allergy -Since initiating therapy with Keflex and doxycycline she reported development of generalized pruritus. discontinued ceftriaxone-- ? allergy  History of diastolic CHF- appears volume overloaded -Last transthoracic echocardiogram performed on 11/24/2014 that showed EF of 55-60% with grade 1 diastolic dysfunction -Restarted torsemide 40 mg PO BID and spironolactone 25 mg PO q daily 3/22-- put off >2L  -Will need to monitor her renal function closely Elevate extremitiy -FLUID RESTRICT -cardiology saw, no need for IV lasix  Hypertension -Blood pressure on the low side,  continue Coreg 6.25 mg by mouth twice a day  Dyslipidemia -Continue Pravachol 80 mg by mouth daily  History of coronary artery  disease. -She does not appear to have active cardiac issues, denies chest pain. -Continue aspirin, beta blocker and statin  History of stage III chronic kidney disease. -It appears her baseline creatinine near 1.6-1.8 -restarted torsemide 40 minutes by mouth twice a day and spironolactone 25 mg by mouth daily during this hospitalization given increasing lower extremity edema. -Will continue monitoring of renal function  Procedures:    Consultations:  cards  Discharge Exam: Filed Vitals:   05/04/15 1352 05/05/15 0520  BP: 111/65 99/53  Pulse: 84 68  Temp: 98 F (36.7 C) 97.7 F (36.5 C)  Resp: 18 18    General: awake, NAD-- redness resolved in left ankle-- still with swelling   Discharge Instructions   Discharge Instructions    Diet - low sodium heart healthy    Complete by:  As directed      Diet Carb Modified    Complete by:  As directed      Discharge instructions    Complete by:  As directed   RICE for left ankle-- rest, ice, compression, elevation Rolling walker PCP follow up 1 week for check of swelling     Increase activity slowly    Complete by:  As directed           Current Discharge Medication List    START taking these medications   Details  HYDROcodone-acetaminophen (NORCO/VICODIN) 5-325 MG tablet Take 1-2 tablets by mouth every 4 (four) hours as needed for moderate pain or severe pain. Qty: 15 tablet, Refills: 0      CONTINUE these medications which have NOT CHANGED   Details  albuterol (PROVENTIL HFA;VENTOLIN HFA) 108 (90 BASE) MCG/ACT inhaler Inhale 2 puffs into the lungs every 4 (four) hours as needed for wheezing (wheezing).     aspirin EC 325 MG EC tablet Take 1 tablet (325 mg total) by mouth daily. Qty: 30 tablet, Refills: 0    carvedilol (COREG) 6.25 MG tablet Take 1 tablet (6.25 mg total) by mouth 2 (two) times daily with a meal. Qty: 60 tablet, Refills: 3    cetirizine (ZYRTEC) 10 MG tablet Take 10 mg by mouth daily.      clotrimazole (MYCELEX) 10 MG troche Take 10 mg by mouth every 4 (four) hours as needed (mouth pain).    fluticasone (FLONASE) 50 MCG/ACT nasal spray Place 1 spray into the nose 2 (two) times daily as needed for rhinitis or allergies.     KLOR-CON M20 20 MEQ tablet TAKE ONE TABLET BY MOUTH ONCE DAILY Qty: 60 tablet, Refills: 4    omeprazole (PRILOSEC) 40 MG capsule Take 1 capsule (40 mg total) by mouth daily. Qty: 30 capsule, Refills: 11    pravastatin (PRAVACHOL) 80 MG tablet Take 1 tablet (80 mg total) by mouth every evening. Qty: 90 tablet, Refills: 2    spironolactone (ALDACTONE) 25 MG tablet Take 25 mg by mouth daily.    SYMBICORT 160-4.5 MCG/ACT inhaler Inhale 2 puffs into the lungs 2 (two) times daily.    torsemide (DEMADEX) 20 MG tablet Take 40 mg by mouth 2 (two) times daily.    triamcinolone cream (KENALOG) 0.1 % Apply 1 application topically daily as needed (affected area).       STOP taking these medications     cephALEXin (KEFLEX) 500 MG capsule      doxycycline (VIBRA-TABS) 100 MG  tablet      ibuprofen (ADVIL,MOTRIN) 200 MG tablet        Allergies  Allergen Reactions  . Ivp Dye [Iodinated Diagnostic Agents] Shortness Of Breath    No reaction to PO contrast with non-ionic dye.06-25-2014/rsm  . Shellfish-Derived Products Anaphylaxis  . Sulfa Antibiotics Shortness Of Breath  . Iodine Hives  . Cephalexin Itching and Rash    Rash & itch after Keflex  . Zithromax [Azithromycin] Rash   Follow-up Information    Follow up with [name], MD In 1 week.   Specialty:  Family Medicine   Contact information:   [address] [place] [address] [phone]        The results of significant diagnostics from this hospitalization (including imaging, microbiology, ancillary and laboratory) are listed below for reference.    Significant Diagnostic Studies: Dg Chest 2 View  04/29/2015  CLINICAL DATA:  Shortness of breath.  Leg swelling.  EXAM: CHEST  2 VIEW COMPARISON:  02/13/2015. FINDINGS: The cardiac silhouette remains borderline enlarged. Stable post CABG changes and right subclavian pacer and AICD leads. Stable linear scar in the left lower lung zone. Otherwise, clear lungs. Thoracic spine degenerative changes, including changes of DISH. Cervical spine fixation hardware. IMPRESSION: No acute abnormality. Electronically Signed   By: Claudie Revering M.D.   On: 04/29/2015 15:17   Dg Lumbar Spine 2-3 Views  04/30/2015  CLINICAL DATA:  70 year old female with mid to low back pain (Left greater than right) for the past 2 weeks. History of fall 1 month ago. No recent falls. EXAM: LUMBAR SPINE - 2-3 VIEW COMPARISON:  No priors. FINDINGS: Three views of the lumbar spine demonstrate no definite acute displaced fractures. There is some straightening of normal lumbar lordosis which is likely chronic and related to underlying degenerative disease. Multilevel degenerative disc disease, most severe at T12-L1, L1-L2, L2-L3 and L4-L5. Severe multilevel facet arthropathy, most severe at L4-L5 and L5-S1. Extensive vascular calcifications are noted. IMPRESSION: 1. No acute radiographic abnormality of the lumbar spine. 2. Severe multilevel degenerative disc disease and lumbar spondylosis, as above. 3. Severe atherosclerosis. Electronically Signed   By: Vinnie Langton M.D.   On: 04/30/2015 15:23   Dg Foot Complete Left  05/04/2015  CLINICAL DATA:  Fall EXAM: LEFT FOOT - COMPLETE 3+ VIEW COMPARISON:  None. FINDINGS: No acute fracture. No dislocation. Degenerative changes in the metatarsophalangeal joints and IP joints are noted. Spurring at the inferior and posterior calcaneus. Soft tissue swelling over the forefoot is noted. IMPRESSION: No acute bony pathology.  Chronic changes. Electronically Signed   By: Marybelle Killings M.D.   On: 05/04/2015 12:46    Microbiology: Recent Results (from the past 240 hour(s))  Wet prep, genital     Status: None   Collection  Time: 04/29/15  6:33 PM  Result Value Ref Range Status   Yeast Wet Prep HPF POC NONE SEEN NONE SEEN Final   Trich, Wet Prep NONE SEEN NONE SEEN Final   Clue Cells Wet Prep HPF POC NONE SEEN NONE SEEN Final   WBC, Wet Prep HPF POC NONE SEEN NONE SEEN Final   Sperm NONE SEEN  Final     Labs: Basic Metabolic Panel:  Recent Labs Lab 04/30/15 0412 05/01/15 0449 05/02/15 0422 05/03/15 0434 05/04/15 0420  NA 141 140 139 138 139  K 3.8 3.8 3.6 3.4* 3.9  CL 108 109 102 100* 99*  CO2 25 25 28 27 30   GLUCOSE 122* 131* 113*  122* 118*  BUN 44* 39* 39* 48* 53*  CREATININE 1.65* 1.37* 1.58* 1.63* 1.92*  CALCIUM 8.7* 8.7* 8.8* 9.1 9.1   Liver Function Tests:  Recent Labs Lab 05/02/15 0422  AST 24  ALT 27  ALKPHOS 102  BILITOT 0.5  PROT 6.4*  ALBUMIN 3.1*   No results for input(s): LIPASE, AMYLASE in the last 168 hours. No results for input(s): AMMONIA in the last 168 hours. CBC:  Recent Labs Lab 04/29/15 1644 04/29/15 2057 05/01/15 0449 05/02/15 0422 05/03/15 0434 05/04/15 0420  WBC 10.1  --  5.2 6.7 6.5 7.3  NEUTROABS 8.4*  --   --   --   --   --   HGB 11.9*  --  10.3* 10.7* 10.7* 10.7*  HCT 39.2 34.9 35.6* 37.4 35.9* 35.4*  MCV 75.7*  --  79.5 78.7 75.1* 76.6*  PLT 249  --  213 235 220 229   Cardiac Enzymes:  Recent Labs Lab 04/29/15 2057  TROPONINI <0.03   BNP: BNP (last 3 results)  Recent Labs  11/23/14 1744 12/26/14 0410 01/10/15 1601  BNP 68.0 359.7* 137.1*    ProBNP (last 3 results) No results for input(s): PROBNP in the last 8760 hours.  CBG:  Recent Labs Lab 05/01/15 0742 05/02/15 0802 05/03/15 0812 05/04/15 0750 05/05/15 0817  GLUCAP 102* 115* 105* 104* 112*       Signed:  Taygen Newsome U Loukisha Gunnerson DO.  [facility] 05/05/2015, 4:16 PM

## 2015-05-07 ENCOUNTER — Ambulatory Visit (HOSPITAL_COMMUNITY): Payer: Medicare Other

## 2015-05-08 ENCOUNTER — Encounter: Payer: Medicare Other | Admitting: Cardiovascular Disease

## 2015-05-09 ENCOUNTER — Ambulatory Visit (HOSPITAL_COMMUNITY): Payer: Medicare Other

## 2015-05-11 ENCOUNTER — Ambulatory Visit (HOSPITAL_COMMUNITY): Payer: Medicare Other

## 2015-05-14 ENCOUNTER — Ambulatory Visit (HOSPITAL_COMMUNITY): Payer: Medicare Other

## 2015-05-16 ENCOUNTER — Ambulatory Visit (HOSPITAL_COMMUNITY): Payer: Medicare Other

## 2015-05-18 ENCOUNTER — Ambulatory Visit (HOSPITAL_COMMUNITY): Payer: Medicare Other

## 2015-05-21 ENCOUNTER — Ambulatory Visit (HOSPITAL_COMMUNITY): Payer: Medicare Other

## 2015-05-23 ENCOUNTER — Ambulatory Visit (HOSPITAL_COMMUNITY): Payer: Medicare Other

## 2015-05-24 ENCOUNTER — Telehealth (HOSPITAL_COMMUNITY): Payer: Self-pay

## 2015-05-25 ENCOUNTER — Ambulatory Visit (HOSPITAL_COMMUNITY): Payer: Medicare Other

## 2015-05-28 ENCOUNTER — Ambulatory Visit (HOSPITAL_COMMUNITY): Payer: Medicare Other

## 2015-05-30 ENCOUNTER — Ambulatory Visit (HOSPITAL_COMMUNITY): Payer: Medicare Other

## 2015-06-01 ENCOUNTER — Ambulatory Visit (HOSPITAL_COMMUNITY): Payer: Medicare Other

## 2015-06-04 ENCOUNTER — Ambulatory Visit (HOSPITAL_COMMUNITY): Payer: Medicare Other

## 2015-06-05 ENCOUNTER — Telehealth: Payer: Self-pay

## 2015-06-05 NOTE — Telephone Encounter (Signed)
Faxed signed medicaid farm to [employer] at [employer].

## 2015-06-06 ENCOUNTER — Ambulatory Visit (HOSPITAL_COMMUNITY): Payer: Medicare Other

## 2015-06-07 ENCOUNTER — Telehealth (HOSPITAL_COMMUNITY): Payer: Self-pay

## 2015-06-07 NOTE — Telephone Encounter (Signed)
Spoke with [name] ref.# [id number] No deductible, no authorization required, max out of pocket is 4900, $20 copay per session and member has not met $0 towards out of pocket.

## 2015-06-07 NOTE — Telephone Encounter (Signed)
Spoke with [name]

## 2015-06-08 ENCOUNTER — Ambulatory Visit (HOSPITAL_COMMUNITY): Payer: Medicare Other

## 2015-06-11 ENCOUNTER — Ambulatory Visit (HOSPITAL_COMMUNITY): Payer: Medicare Other

## 2015-06-13 ENCOUNTER — Ambulatory Visit (HOSPITAL_COMMUNITY): Payer: Medicare Other

## 2015-06-15 ENCOUNTER — Ambulatory Visit (HOSPITAL_COMMUNITY): Payer: Medicare Other

## 2015-06-18 ENCOUNTER — Ambulatory Visit (HOSPITAL_COMMUNITY): Payer: Medicare Other

## 2015-06-18 ENCOUNTER — Telehealth: Payer: Self-pay | Admitting: Cardiovascular Disease

## 2015-06-18 NOTE — Telephone Encounter (Signed)
New message     Pt c/o Shortness Of Breath: STAT if SOB developed within the last 24 hours or pt is noticeably SOB on the phone  1. Are you currently SOB (can you hear that pt is SOB on the phone)? Pt states that she has had SOB. Couldn't hear it over the phone.    2. How long have you been experiencing SOB? All weekend  3. Are you SOB when sitting or when up moving around? both  4. Are you currently experiencing any other symptoms? Per pt chest pain     Pt c/o of Chest Pain: 1. Are you having CP right now? No not at the moment 2. Are you experiencing any other symptoms (ex. SOB, nausea, vomiting, sweating)? SOB only 3. How long have you been experiencing CP? This morning around 4:30am 4. Is your CP continuous or coming and going?coming and going 5. Have you taken Nitroglycerin? no  Message sent by: Mariane Duval

## 2015-06-18 NOTE — Telephone Encounter (Signed)
Pt c/o CP, SOB x2 days. She notes the SOB is worse positionally but denies any knowledge of wt changes. She notes exertionally worse, but she is also SOB at rest.  She states chest pain - she reports she has had some chest pain since last hospitalization but notes worse this weekend and further increase of severity this AM. 7-8/10.  I advised ED evaluation for her symptoms. Pt acknowledged and will get family member to drive her to [facility]. Trish alerted to pending pt arrival via private vehicle.

## 2015-06-20 ENCOUNTER — Ambulatory Visit (HOSPITAL_COMMUNITY): Payer: Medicare Other

## 2015-06-20 ENCOUNTER — Ambulatory Visit
Admission: RE | Admit: 2015-06-20 | Discharge: 2015-06-20 | Disposition: A | Discharge status: Home / Self Care | Payer: Medicare Other | Source: Ambulatory Visit | Attending: Cardiovascular Disease | Admitting: Cardiovascular Disease

## 2015-06-20 ENCOUNTER — Encounter: Payer: Self-pay | Admitting: Cardiovascular Disease

## 2015-06-20 ENCOUNTER — Ambulatory Visit (INDEPENDENT_AMBULATORY_CARE_PROVIDER_SITE_OTHER): Payer: Medicare Other | Admitting: Cardiovascular Disease

## 2015-06-20 VITALS — BP 120/62 | HR 68 | Ht 66.0 in | Wt 245.0 lb

## 2015-06-20 DIAGNOSIS — I442 Atrioventricular block, complete: Secondary | ICD-10-CM

## 2015-06-20 DIAGNOSIS — I2510 Atherosclerotic heart disease of native coronary artery without angina pectoris: Secondary | ICD-10-CM

## 2015-06-20 DIAGNOSIS — I429 Cardiomyopathy, unspecified: Secondary | ICD-10-CM | POA: Diagnosis not present

## 2015-06-20 DIAGNOSIS — R0602 Shortness of breath: Secondary | ICD-10-CM | POA: Diagnosis not present

## 2015-06-20 DIAGNOSIS — I5032 Chronic diastolic (congestive) heart failure: Secondary | ICD-10-CM | POA: Diagnosis not present

## 2015-06-20 DIAGNOSIS — Z950 Presence of cardiac pacemaker: Secondary | ICD-10-CM

## 2015-06-20 DIAGNOSIS — I428 Other cardiomyopathies: Secondary | ICD-10-CM

## 2015-06-20 NOTE — Patient Instructions (Signed)
Medication Instructions: Dr [name] recommends that you continue on your current medications as directed. Please refer to the Current Medication list given to you today.  Labwork: NONE ORDERED  Testing/Procedures: 1. Chest X-ray - A chest x-ray takes a picture of the organs and structures inside the chest, including the heart, lungs, and blood vessels. This test can show several things, including, whether the heart is enlarges; whether fluid is building up in the lungs; and whether pacemaker / defibrillator leads are still in place.  Follow-up: Dr [name] recommends that you schedule a follow-up appointment in 2-3 weeks in the heart failure clinic or with him.  If you need a refill on your cardiac medications before your next appointment, please call your pharmacy.

## 2015-06-20 NOTE — Progress Notes (Signed)
Patient ID: [name], female   DOB: [birth year], 70 y.o.   MRN: [id number] .    Cardiology Office Note    Date:  06/22/2015   ID:  [name], DOB [birth year], MRN [id number]  PCP:  [name], MD  Cardiologist:   Sanda Klein, MD   Chief Complaint  Patient presents with  . Follow-up    pacer check  . Dizziness  . Shortness of Breath  . Chest Pain  . Edema    History of Present Illness:  [name] is a 70 y.o. female with CAD s/p CABG October 2016 (LIMA to LAD, SVG to D1, SVG to OM1 and OM2, SVG to PDA), diastolic heart failure, morbid obesity, adrenal insufficiency, type 2 diabetes mellitus, Hypertension, stage III chronic kidney disease, asthma, obstructive sleep apnea. Nonischemic cardiomyopathy and heart failure preceded the diagnosis of coronary disease. She has complete heart block and had a conventional pacemaker implanted in 2001. This was upgraded to a biventricular ICD implanted in 2008 for severe cardiomyopathy with ejection fraction 20%, downgraded to CRT-P in 2014 ([facility]) following excellent response to resynchronization (hyper-responder, normalization of left ventricular systolic function). The current atrial and right ventricular leads were implanted in 2001, the left ventricular lead was implanted in 2008. The defibrillator lead implanted in 2008 is capped and its tip was resected at the time of bypass surgery.  She has had a rough time since her bypass procedure. She was in the skilled nursing facility for several weeks. She was admitted in November with right lower extremity cellulitis and acute exacerbation of heart failure. She responded to treatment with antibiotics and diuretics and at that point her dry weight was estimated at around 246 pounds. In January she was seen in the heart failure clinic and her weight was 237 pounds and it was felt that she might be slightly hypovolemic.. Most recent echo shows ejection fraction normal at  55-60 percent with grade 1 diastolic dysfunction. After returning home she slipped and has a left ankle fracture. Was not initially obvious and she was treated for a while for cellulitis and developed an antibiotic related rash. Repeat x-ray demonstrated a fracture. She will be wearing a cast for another 6 weeks.  Today she weighs 246 pounds. She complains of having being short of breath. She has been taking an extra torsemide for the last 2 days and believes that she feels a little better. She states that her breathing is distinct worse if she tries to lie on her right side. I'm not sure she is describing orthopnea, but she seems to have functional class III exertional dyspnea. She has not heard any wheezing. She denies angina pectoris.  At the time of surgery, one of her right ventricular leads was found to have perforated the right ventricular wall. The tip of the lead was resected and the defect was repaired. Thankfully this was the old defibrillator lead that was not in use and not the new right ventricular paced sense lead. However, ever since bypass surgery, atrial pacing thresholds have been extremely high.  Interrogation of her [employer] CRT-P shows 100% biventricular pacing. She has very high atrial pacing thresholds, but never requires atrial pacing. Heart rate histogram distribution is normal, driven by normal sinus rhythm. Other parameters of the atrial lead are normal (sensed P wave 1.1 mV, impedance 570 ohms). The device is programmed VDD .Estimated generator longevity is 8 years. The left ventricular lead is programmed LVtip to LVring. Heart  rate histogram distribution is normal. It has been no recorded atrial or ventricular tachyarrhythmia.  Past Medical History  Diagnosis Date  . Asthma   . Hypertension   . Arthritis   . GERD (gastroesophageal reflux disease)   . Diverticulosis   . Anemia   . CAD (coronary artery disease)   . Fatty liver disease, nonalcoholic   .  Internal hemorrhoids   . Gastritis   . Hyperplastic colon polyp   . OSA (obstructive sleep apnea)   . ICD (implantable cardiac defibrillator) in place   . Shortness of breath   . IBS (irritable bowel syndrome)   . H/O hiatal hernia   . Obesity   . Diabetes mellitus without complication ([facility])   . Cellulitis and abscess of foot 12/2014    RT FOOT    Past Surgical History  Procedure Laterality Date  . Pacemaker insertion  2001    BS BivICD  . Cardiac defibrillator placement    . Appendectomy    . Knee surgery      right  . Ankle surgery      right  . Tubal ligation    . Cervical spine surgery      plate   . Coronary angiogram  2010  . Abdominal ultrasound  12/01/2011    Peripelvic cysts- #1- 2.4x1.9x2.3cm, #2-1.2x0.9x1.2cm  . Cardiac catheterization  05/17/1999    No significant coronary obstructive disease w/ mild 20% luminal irregularity of the first diag branch of the LAD  . Cardiac catheterization  07/08/2002    No significant CAD, moderately depressed LV systolic function  . Cardiac catheterization Bilateral 04/26/2007    Normal findings, recommend medical therapy  . Cardiac catheterization  02/18/2008    Moderate CAD, would benefit from having a functional study, recommend continue medical therapy  . Cardiac catheterization  07/23/2012    Medical therapy  . Lexiscan myoview  11/14/2011    Mild-moderate defect seen in Mid Inferolateral and Mid Anterolateral regions-consistant w/ infarct/scar. No significant ischemia demonstrated.  . Transthoracic echocardiogram  07/23/2012    EF 55-60%, normal-mild  . Left heart catheterization with coronary angiogram N/A 07/23/2012    Procedure: LEFT HEART CATHETERIZATION WITH CORONARY ANGIOGRAM;  Surgeon: Leonie Man, MD;  Location: [facility] CATH LAB;  Service: Cardiovascular;  Laterality: N/A;  . Biv pacemaker generator change out N/A 11/02/2012    Procedure: BIV PACEMAKER GENERATOR CHANGE OUT;  Surgeon: Sanda Klein, MD;  Location: [facility] CATH  LAB;  Service: Cardiovascular;  Laterality: N/A;  . Cardiac catheterization N/A 11/24/2014    Procedure: Left Heart Cath and Coronary Angiography;  Surgeon: Troy Sine, MD;  Location: [facility] CV LAB;  Service: Cardiovascular;  Laterality: N/A;  . Cardiac catheterization  11/27/2014    Procedure: Intravascular Pressure Wire/FFR Study;  Surgeon: Peter M Martinique, MD;  Location: [facility] CV LAB;  Service: Cardiovascular;;  . Coronary artery bypass graft N/A 11/29/2014    Procedure: CORONARY ARTERY BYPASS GRAFTING x 5 (LIMA-LAD, SVG-D, SVG-OM1-OM2, SVG-PD);  Surgeon: Melrose Nakayama, MD;  Location: [facility];  Service: Open Heart Surgery;  Laterality: N/A;  . Tee without cardioversion N/A 11/29/2014    Procedure: TRANSESOPHAGEAL ECHOCARDIOGRAM (TEE);  Surgeon: Melrose Nakayama, MD;  Location: [facility];  Service: Open Heart Surgery;  Laterality: N/A;    Current Medications: Outpatient Prescriptions Prior to Visit  Medication Sig Dispense Refill  . albuterol (PROVENTIL HFA;VENTOLIN HFA) 108 (90 BASE) MCG/ACT inhaler Inhale 2 puffs into the lungs every 4 (four) hours  as needed for wheezing (wheezing).     [name] aspirin EC 325 MG EC tablet Take 1 tablet (325 mg total) by mouth daily. 30 tablet 0  . carvedilol (COREG) 6.25 MG tablet Take 1 tablet (6.25 mg total) by mouth 2 (two) times daily with a meal. 60 tablet 3  . cetirizine (ZYRTEC) 10 MG tablet Take 10 mg by mouth daily.     . clotrimazole (MYCELEX) 10 MG troche Take 10 mg by mouth every 4 (four) hours as needed (mouth pain).    . fluticasone (FLONASE) 50 MCG/ACT nasal spray Place 1 spray into the nose 2 (two) times daily as needed for rhinitis or allergies.     [name] HYDROcodone-acetaminophen (NORCO/VICODIN) 5-325 MG tablet Take 1-2 tablets by mouth every 4 (four) hours as needed for moderate pain or severe pain. 15 tablet 0  . KLOR-CON M20 20 MEQ tablet TAKE ONE TABLET BY MOUTH ONCE DAILY (Patient taking differently: TAKE 20 MEQ BY MOUTH ONCE  DAILY) 60 tablet 4  . omeprazole (PRILOSEC) 40 MG capsule Take 1 capsule (40 mg total) by mouth daily. 30 capsule 11  . pravastatin (PRAVACHOL) 80 MG tablet Take 1 tablet (80 mg total) by mouth every evening. 90 tablet 2  . spironolactone (ALDACTONE) 25 MG tablet Take 25 mg by mouth daily.    . SYMBICORT 160-4.5 MCG/ACT inhaler Inhale 2 puffs into the lungs 2 (two) times daily.    [name] torsemide (DEMADEX) 20 MG tablet Take 40 mg by mouth 2 (two) times daily.    [name] triamcinolone cream (KENALOG) 0.1 % Apply 1 application topically daily as needed (affected area).      No facility-administered medications prior to visit.     Allergies:   Ivp dye; Shellfish-derived products; Sulfa antibiotics; Iodine; Cephalexin; and Zithromax   Social History   Social History  . Marital Status: Widowed    Spouse Name: N/A  . Number of Children: 5  . Years of Education: N/A   Occupational History  . 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 Smoking status: Former Smoker -- 0.25 packs/day for 3 years    Types: Cigarettes    Quit date: 02/11/1968  . Smokeless tobacco: Never Used  . Alcohol Use: No  . Drug Use: No  . Sexual Activity: No   Other Topics Concern  . None   Social History Narrative   Widowed last year. She lives with her two sons.     Family History:  The patient's family history includes Asthma in her sister; Breast cancer in her mother; Diabetes in her maternal grandmother and mother; Glaucoma in her maternal aunt; Heart disease in her maternal aunt and maternal grandmother; Kidney disease in her maternal grandmother.   ROS:   Please see the history of present illness.    ROS All other systems reviewed and are negative.   PHYSICAL EXAM:   VS:  BP 120/62 mmHg  Pulse 68  Ht 5\' 6"  (1.676 m)  Wt 111.131 kg (245 lb)  BMI 39.56 kg/m2   GEN: Morbid obesity limits her physical exam, well developed, in no acute distress HEENT: normal Neck: no JVD, carotid  bruits, or masses Cardiac: RRR; no murmurs, rubs, or gallops,no edema  Respiratory:  clear to auscultation bilaterally, normal work of breathing GI: soft, nontender, nondistended, + BS MS: no deformity or atrophy Skin: warm and dry, no rash Neuro:  Alert and Oriented x 3, Strength and sensation are intact Psych: euthymic mood, full  affect  Wt Readings from Last 3 Encounters:  06/20/15 111.131 kg (245 lb)  05/05/15 110.36 kg (243 lb 4.8 oz)  04/25/15 111.494 kg (245 lb 12.8 oz)      Studies/Labs Reviewed:   EKG:  EKG is not ordered today.   Recent Labs: 12/28/2014: Magnesium 2.0 01/10/2015: B Natriuretic Peptide 137.1* 02/21/2015: TSH 3.677 05/02/2015: ALT 27 05/04/2015: BUN 53*; Creatinine, Ser 1.92*; Hemoglobin 10.7*; Platelets 229; Potassium 3.9; Sodium 139   Lipid Panel    Component Value Date/Time   CHOL 189 11/24/2014 0432   TRIG 104 11/24/2014 0432   HDL 51 11/24/2014 0432   CHOLHDL 3.7 11/24/2014 0432   VLDL 21 11/24/2014 0432   LDLCALC 117* 11/24/2014 0432    Additional studies/ records that were reviewed today include:  Notes from heart failure clinic  ASSESSMENT:    1. Chronic diastolic heart failure ([place])   2. CAD, multiple vessel   3. Complete heart block (H[facility])   4. Cardiomyopathy, nonischemic, Bi V responder,  EF 65-70% echo Oct 2015   5. Biventricular cardiac pacemaker in situ   6. Shortness of breath      PLAN:  In order of problems listed above:  1. CHF: Volume status has always been challenging to assess due to her obesity. Her weight today is identical to what we previously had estimated to be her dry weight. She has been taking estradiol last 2 days. I don't really hear clear evidence of dullness in her right base, but she insists that she is much more short of breath if she lies on the right side. We'll schedule a chest x-ray to look for a pleural effusion. For the time being I think she can continue taking the higher dose of diuretic, but we  will need to recheck lab tests 2. CAD s/p CABG without complaints of angina 3. CHB: Pacemaker dependent 4. CMP with normalization of systolic function following biventricular pacing 5. CRT-P with normal device function except for very high atrial pacing thresholds (she does not require atrial pacing). Device is programmed VDD. The current right atrial lead and right ventricular lead were placed in 2001. The current left ventricular lead was placed in 2008. 6. Etiology of her dyspnea may be multifactorial, including morbid obesity and restrictive physiology, asthma, pulmonary hypertension due to obesity and obstructive sleep apnea as well as heart failure    Medication Adjustments/Labs and Tests Ordered: Current medicines are reviewed at length with the patient today.  Concerns regarding medicines are outlined above.  Medication changes, Labs and Tests ordered today are listed in the Patient Instructions below. Patient Instructions  Medication Instructions: Dr [name] recommends that you continue on your current medications as directed. Please refer to the Current Medication list given to you today.  Labwork: NONE ORDERED  Testing/Procedures: 1. Chest X-ray - A chest x-ray takes a picture of the organs and structures inside the chest, including the heart, lungs, and blood vessels. This test can show several things, including, whether the heart is enlarges; whether fluid is building up in the lungs; and whether pacemaker / defibrillator leads are still in place.  Follow-up: Dr [name] recommends that you schedule a follow-up appointment in 2-3 weeks in the heart failure clinic or with him.  If you need a refill on your cardiac medications before your next appointment, please call your pharmacy.    Signed, Sanda Klein, MD  06/22/2015 6:35 PM    [place], [place]  [address] Phone: [phone]; Fax: [phone]

## 2015-06-21 IMAGING — CT CT ABD-PELV W/O CM
2 of 4 series · 17 of 46 positions shown, 19 images · non-contrast
Comparison: January 10, 2014

CLINICAL DATA: Back and lower abdominal pain. Assess for
diverticulitis.

EXAM:
CT ABDOMEN AND PELVIS WITHOUT CONTRAST
TECHNIQUE: Multidetector CT imaging of the abdomen and pelvis was performed
following the standard protocol without IV contrast.

[Series 2: abd/pel w/o · axial · non-contrast · 0.91mm/px · z∈[+890,+1325]mm · 14 of 95 slices shown, 16 images]
[im 4/95  soft-tissue]
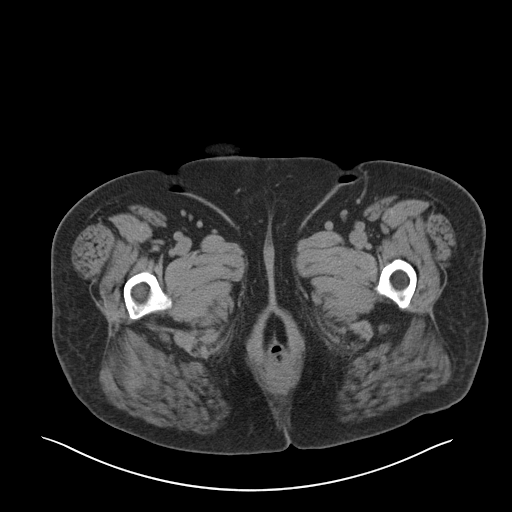
[im 4/95  bone]
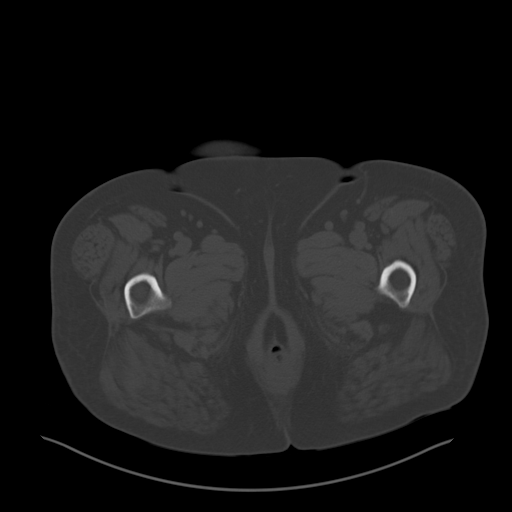
[im 12/95  soft-tissue]
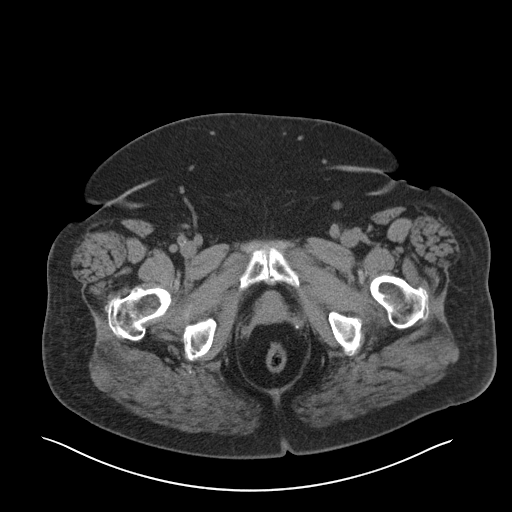
[im 20/95  soft-tissue]
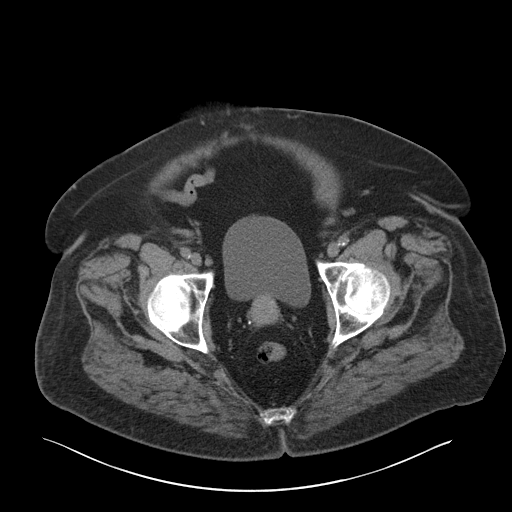
[im 24/95  soft-tissue]
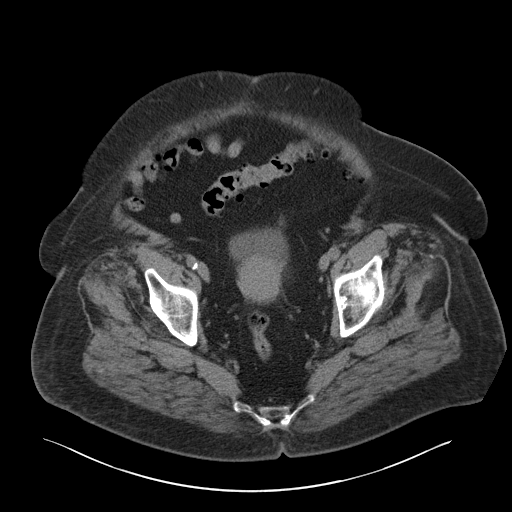
[im 32/95  soft-tissue]
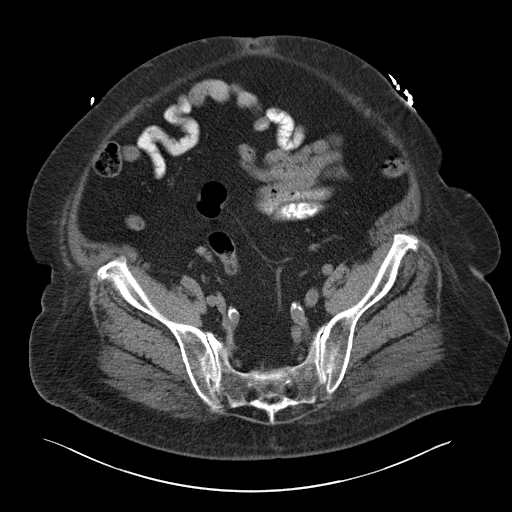
[im 40/95  soft-tissue]
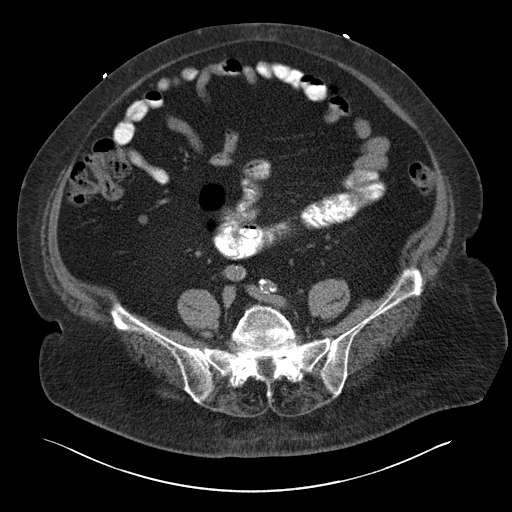
[im 44/95  soft-tissue]
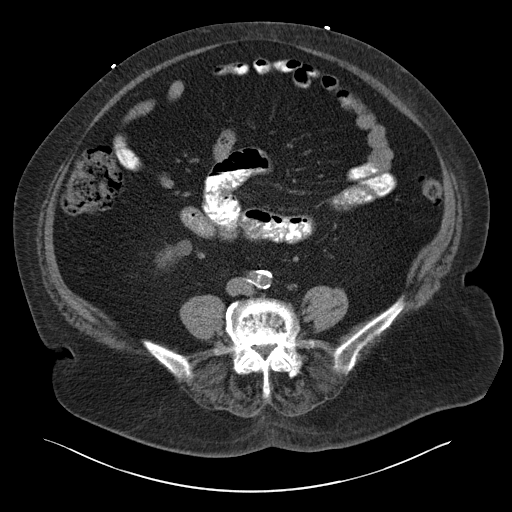
[im 51/95  soft-tissue]
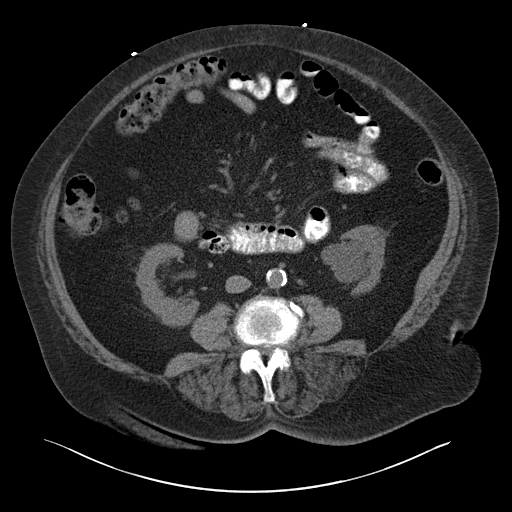
[im 55/95  soft-tissue]
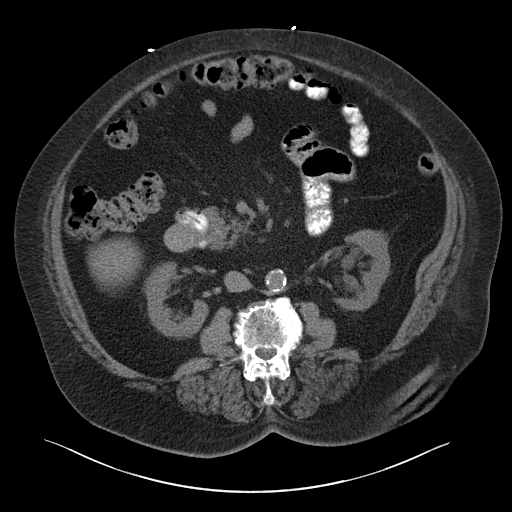
[im 55/95  bone]
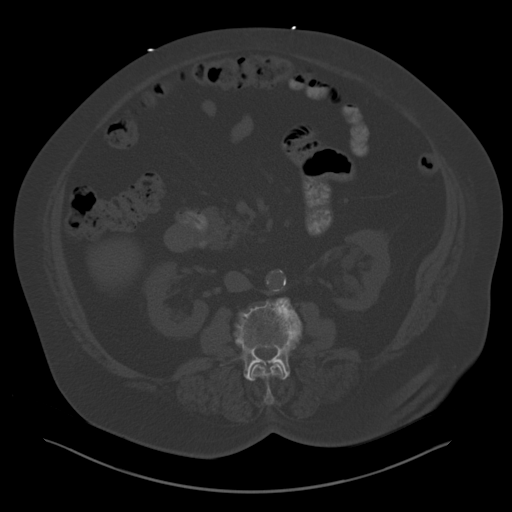
[im 63/95  soft-tissue]
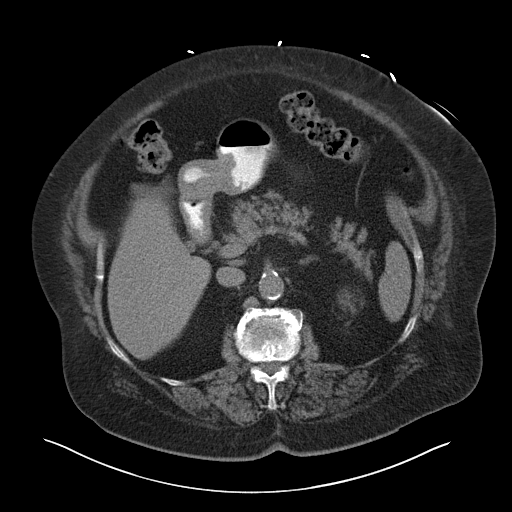
[im 71/95  soft-tissue]
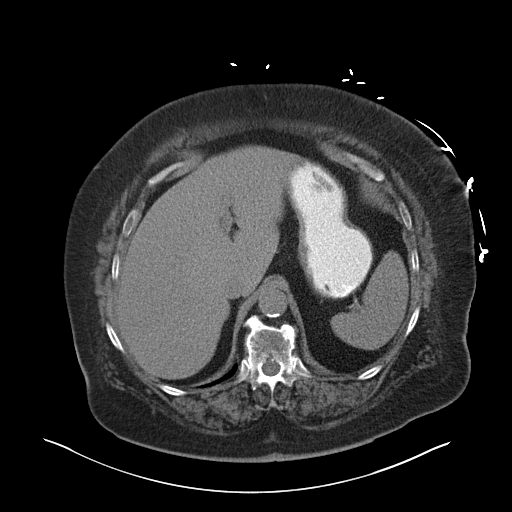
[im 75/95  soft-tissue]
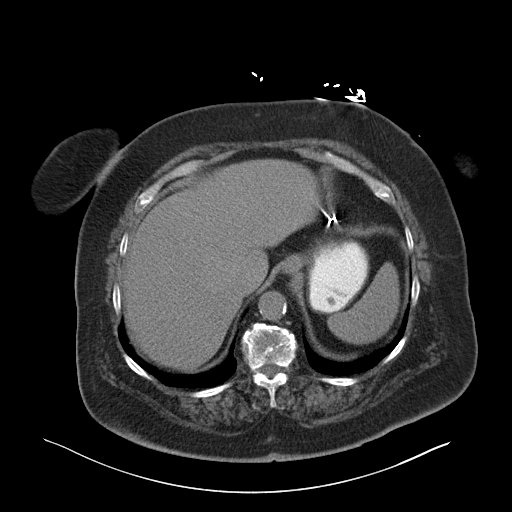
[im 83/95  soft-tissue]
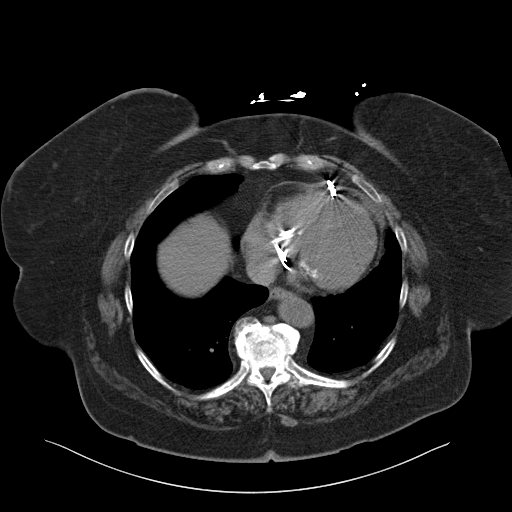
[im 91/95  soft-tissue]
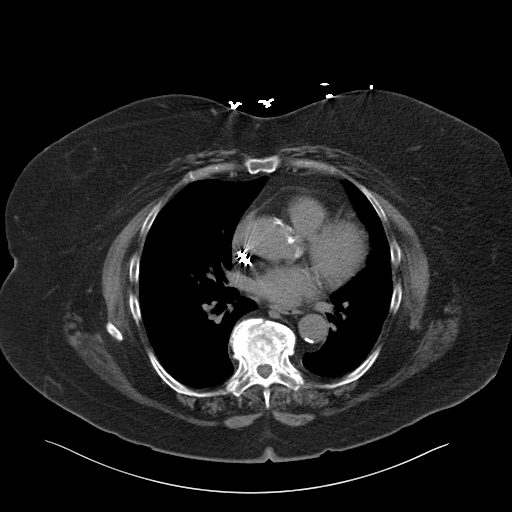

[Series 5: coronal · coronal · 0.87mm/px · 3 of 133 slices shown]
[im 45/133  soft-tissue]
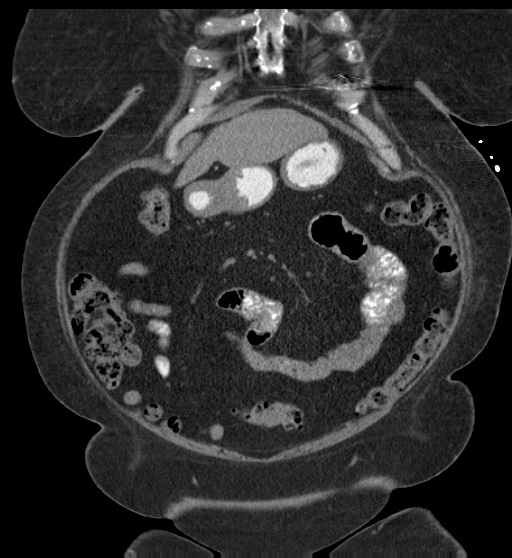
[im 59/133  soft-tissue]
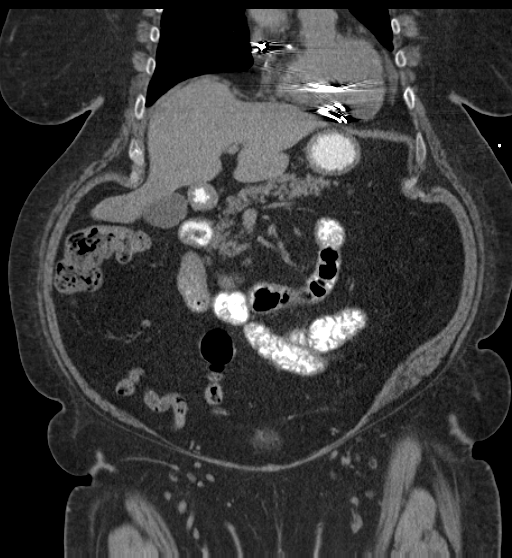
[im 74/133  soft-tissue]
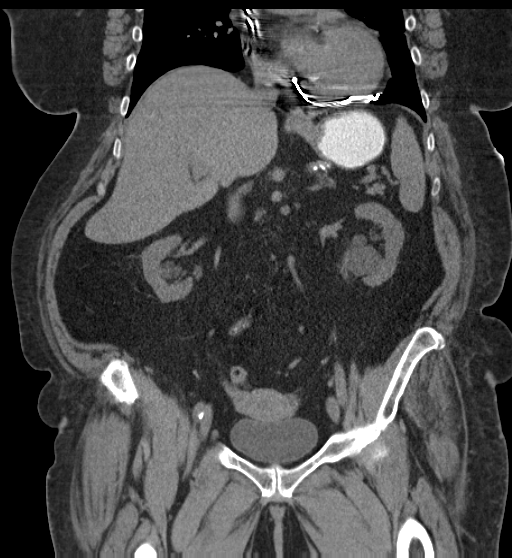

[17 of 46 positions shown; findings below may reference images not displayed]

FINDINGS: The liver, spleen, pancreas, gallbladder, adrenal glands are normal.
There are bilateral parapelvic renal cysts. There is no
nephrolithiasis bilaterally. There is no hydronephrosis bilaterally.
Stable small cortical renal cysts are unchanged. There is
atherosclerosis of the abdominal aorta without aneurysmal
dilatation. There is no abdominal lymphadenopathy.

There is diverticulosis of colon without diverticulitis. The
appendix has been surgically removed. There is no small bowel
obstruction. There is suggestion of bowel wall thickening at the
region of pylorus and first portion of duodenum.

Fluid-filled bladder is normal. The uterus is normal. The visualized
lung bases are clear. Degenerative joint changes of the spine are
identified.
IMPRESSION: Diverticulosis without diverticulitis.  No small bowel obstruction.

Findings suggesting bowel wall thickening at the region of the
pylorus and first portion the duodenum. This is nonspecific. Suggest
further evaluation with upper GI series on outpatient basis.

## 2015-06-21 IMAGING — CR DG CHEST 2V
2 series · 2 of 2 positions shown · non-contrast
Comparison: January 18, 2014

CLINICAL DATA: Cough for 15 days.

EXAM:
CHEST  2 VIEW

[w chest lat]
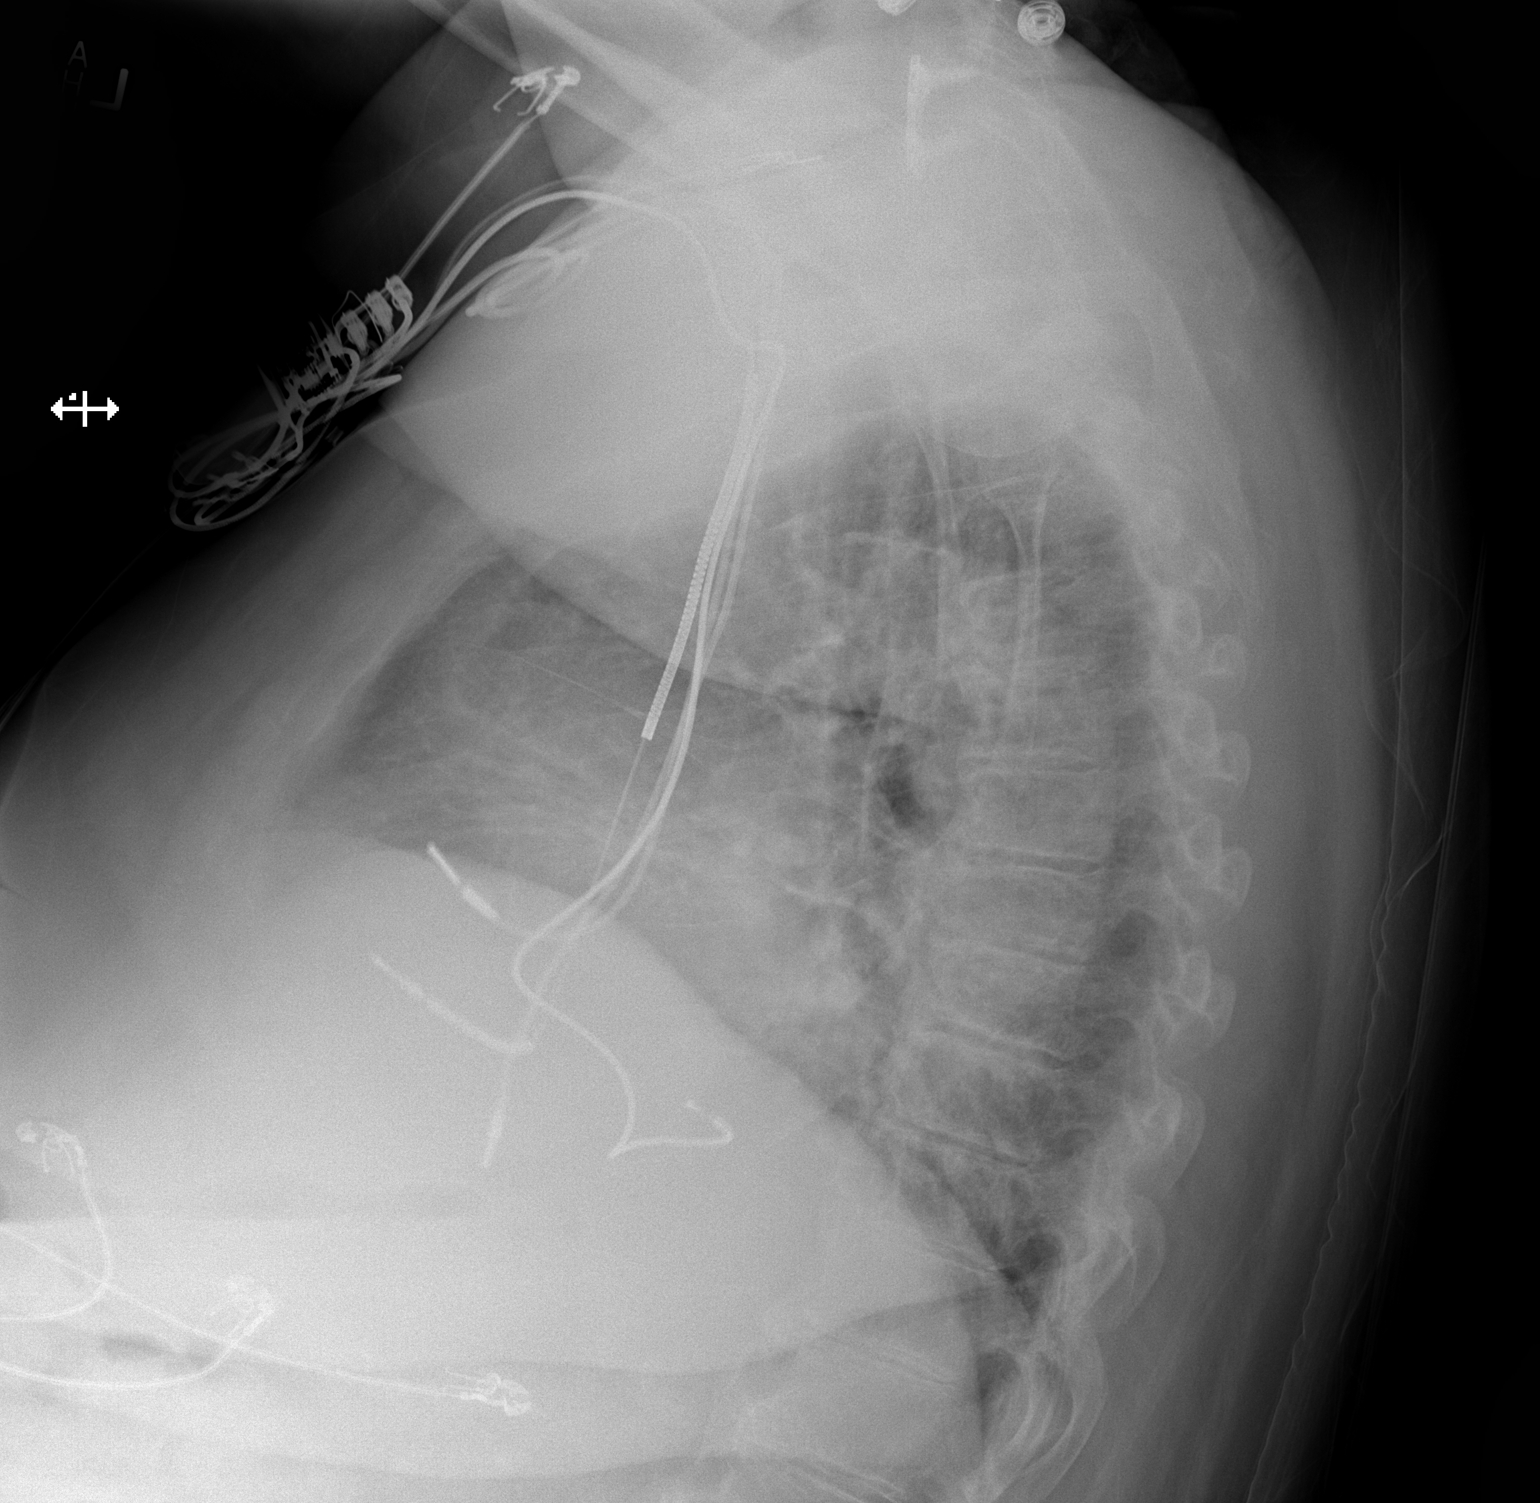

[x chest ap]
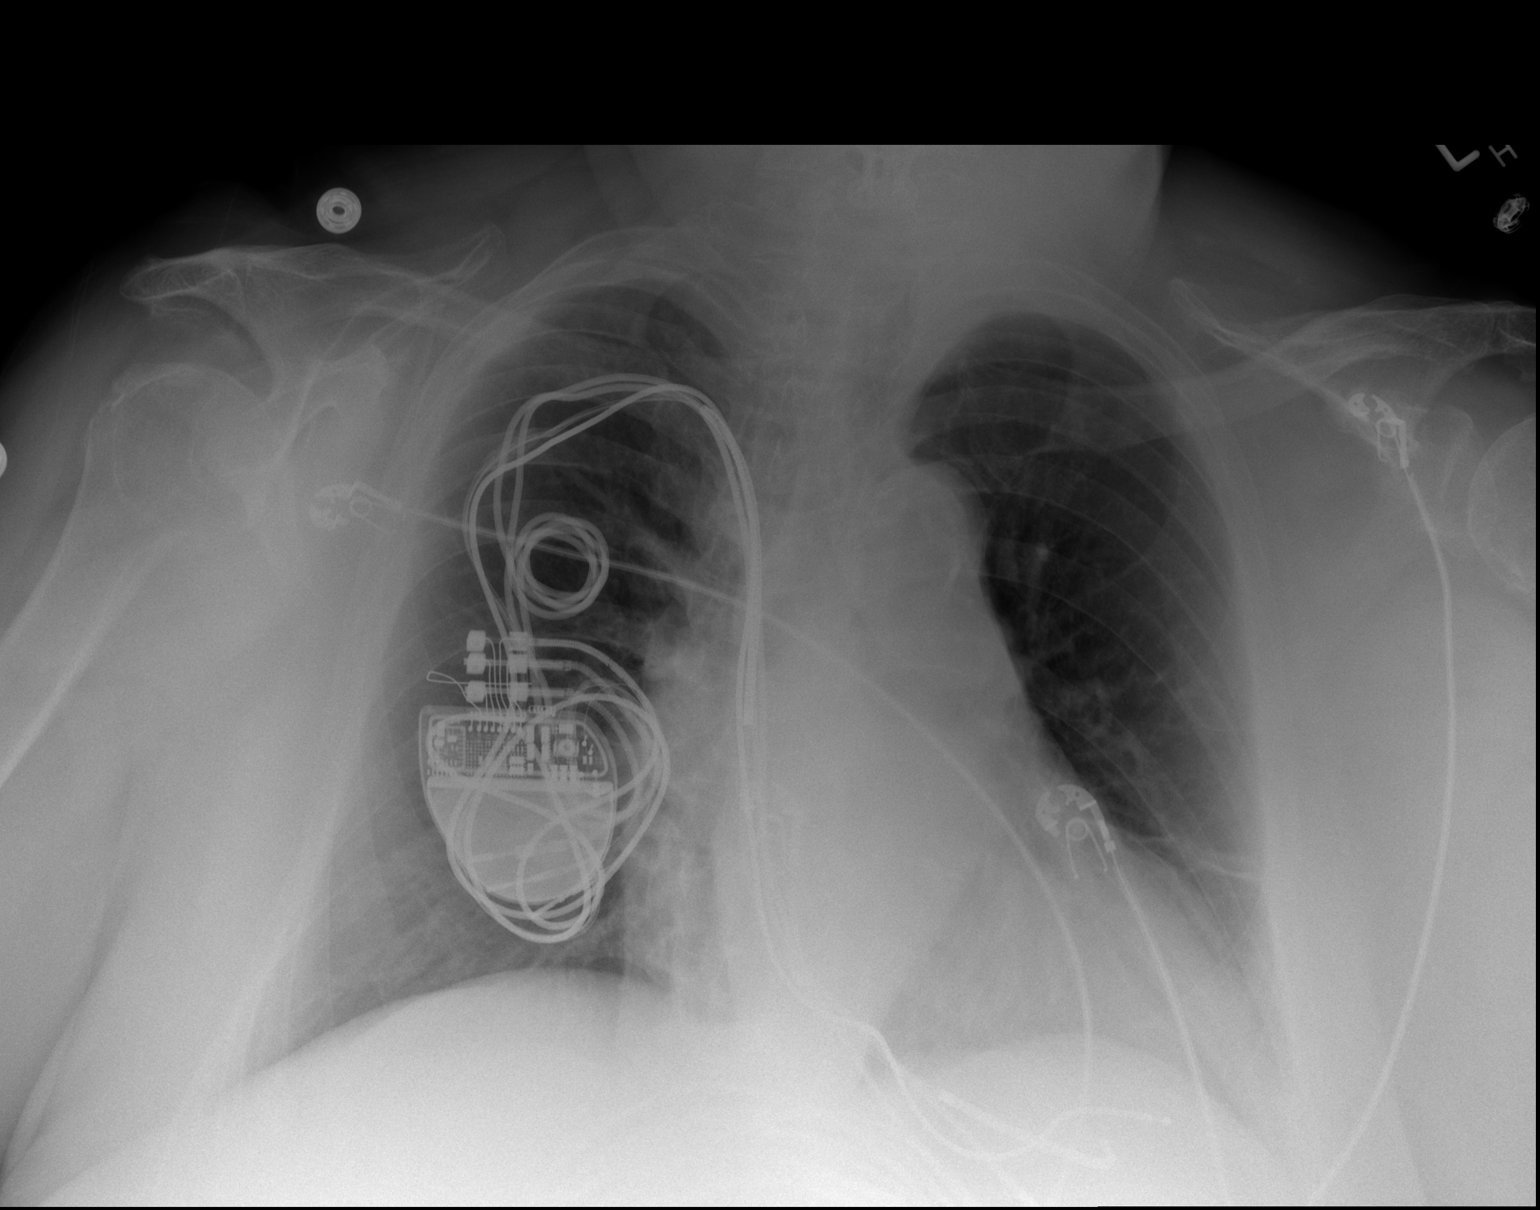

[2 of 2 positions shown; findings below may reference images not displayed]

FINDINGS: The heart size and mediastinal contours are stable. Cardiac
pacemaker is unchanged. The heart size is enlarged. There is mild
scar of the left lung base unchanged. There is no focal infiltrate,
pulmonary edema, or pleural effusion. The visualized skeletal
structures are stable.
IMPRESSION: No active cardiopulmonary disease.

## 2015-06-22 ENCOUNTER — Telehealth: Payer: Self-pay | Admitting: *Deleted

## 2015-06-22 ENCOUNTER — Ambulatory Visit (HOSPITAL_COMMUNITY): Payer: Medicare Other

## 2015-06-22 DIAGNOSIS — I442 Atrioventricular block, complete: Secondary | ICD-10-CM | POA: Insufficient documentation

## 2015-06-22 HISTORY — DX: Atrioventricular block, complete: I44.2

## 2015-06-22 NOTE — Telephone Encounter (Signed)
I will see her in office first and will need to discuss possible endoscopic evaluation. Can patient contact [facility] in the interim? Thanks

## 2015-06-22 NOTE — Telephone Encounter (Signed)
Never have received results on this patients cologuard.  Called patient to make sure she turned in. She said she mailed completed kit back in

## 2015-06-22 NOTE — Telephone Encounter (Signed)
We contacted [facility] and patient was delivered kit on 04/04/2015 but the kit was never returned even though the patient stated she mailed it back.   Dr Silverio Decamp how do you want to proceed ? We will have to get the patient to call the [employer] support line and let her speak to them. Or do you want to proced with the colonoscopy

## 2015-06-25 ENCOUNTER — Ambulatory Visit (HOSPITAL_COMMUNITY): Payer: Medicare Other

## 2015-06-25 ENCOUNTER — Telehealth: Payer: Self-pay

## 2015-06-25 DIAGNOSIS — Z79899 Other long term (current) drug therapy: Secondary | ICD-10-CM

## 2015-06-25 DIAGNOSIS — I442 Atrioventricular block, complete: Secondary | ICD-10-CM

## 2015-06-25 DIAGNOSIS — I5032 Chronic diastolic (congestive) heart failure: Secondary | ICD-10-CM

## 2015-06-25 NOTE — Telephone Encounter (Signed)
-----   Message from Sanda Klein, MD sent at 06/22/2015  6:49 PM EDT ----- Please check BMP and BNP next week.

## 2015-06-25 NOTE — Telephone Encounter (Signed)
Called patient with recommendations. Patient verbalized understanding and agreed with plan.

## 2015-06-27 ENCOUNTER — Ambulatory Visit (HOSPITAL_COMMUNITY): Payer: Medicare Other

## 2015-06-29 ENCOUNTER — Ambulatory Visit (HOSPITAL_COMMUNITY): Payer: Medicare Other

## 2015-06-30 LAB — CUP PACEART INCLINIC DEVICE CHECK
Battery Remaining Longevity: 96 mo
Battery Remaining Percentage: 100 %
Brady Statistic RA Percent Paced: 0 %
Brady Statistic RV Percent Paced: 100 %
Date Time Interrogation Session: 20170510173300
Implantable Lead Implant Date: 20080923
Implantable Lead Implant Date: 20080923
Implantable Lead Implant Date: 20080923
Implantable Lead Location: 753858
Implantable Lead Location: 753859
Implantable Lead Location: 753860
Implantable Lead Model: 4285
Implantable Lead Model: 4555
Implantable Lead Model: 7120
Implantable Lead Serial Number: 170220
Implantable Lead Serial Number: 486468
Lead Channel Impedance Value: 603 Ohm
Lead Channel Impedance Value: 749 Ohm
Lead Channel Impedance Value: 826 Ohm
Lead Channel Pacing Threshold Amplitude: 0.8 V
Lead Channel Pacing Threshold Amplitude: 0.8 V
Lead Channel Pacing Threshold Pulse Width: 0.4 ms
Lead Channel Pacing Threshold Pulse Width: 0.8 ms
Lead Channel Setting Pacing Amplitude: 1.9 V
Lead Channel Setting Pacing Amplitude: 2 V
Lead Channel Setting Pacing Pulse Width: 0.4 ms
Lead Channel Setting Pacing Pulse Width: 0.8 ms
Lead Channel Setting Sensing Sensitivity: 2.5 mV
Lead Channel Setting Sensing Sensitivity: 2.5 mV
Pulse Gen Serial Number: 100731

## 2015-07-02 ENCOUNTER — Telehealth (HOSPITAL_COMMUNITY): Payer: Self-pay

## 2015-07-02 ENCOUNTER — Ambulatory Visit (HOSPITAL_COMMUNITY): Payer: Medicare Other

## 2015-07-02 NOTE — Telephone Encounter (Signed)
Pt is interested in cardiac rehab but unable to do the program. Pt has medicaid insurance and t cardiac hospitalization was for CABG and  chronic diastolic CHF on [id number]. In which pt was d/c to [facility]. Pt was readmitted for established CHF in in Nov. And Jan. EF 55-60%. Pt does not qualify under medicaid to do CR for CHF and is outside of her 6 months period to qualify for the program under CABG. Pt has been trying to get into the program and was due to orient on 04/19/15. At that time pt was unstable and had other medical conditions that would inhibit her from doing cardiac rehab. Pt notified that she is beyond 6 months, and will not qualify under medicaid to do the program, did offer cardiac rehab maintenance at a self pay fee, pt denied, due to financial reasons.    Tiffanye Hartmann [employer]

## 2015-07-04 ENCOUNTER — Ambulatory Visit (HOSPITAL_COMMUNITY): Payer: Medicare Other

## 2015-07-06 ENCOUNTER — Ambulatory Visit (HOSPITAL_COMMUNITY): Payer: Medicare Other

## 2015-07-10 ENCOUNTER — Ambulatory Visit (HOSPITAL_COMMUNITY): Payer: Medicare Other

## 2015-07-11 ENCOUNTER — Encounter (HOSPITAL_COMMUNITY): Payer: Medicare Other | Admitting: Internal Medicine

## 2015-07-11 ENCOUNTER — Ambulatory Visit (HOSPITAL_COMMUNITY): Payer: Medicare Other

## 2015-07-11 ENCOUNTER — Encounter: Payer: Self-pay | Admitting: Cardiovascular Disease

## 2015-07-12 ENCOUNTER — Other Ambulatory Visit (INDEPENDENT_AMBULATORY_CARE_PROVIDER_SITE_OTHER): Payer: Medicare Other

## 2015-07-12 ENCOUNTER — Encounter: Payer: Self-pay | Admitting: Gastroenterology

## 2015-07-12 ENCOUNTER — Ambulatory Visit (INDEPENDENT_AMBULATORY_CARE_PROVIDER_SITE_OTHER): Payer: Medicare Other | Admitting: Gastroenterology

## 2015-07-12 VITALS — BP 100/74 | HR 80 | Ht 65.0 in | Wt 241.0 lb

## 2015-07-12 DIAGNOSIS — D649 Anemia, unspecified: Secondary | ICD-10-CM

## 2015-07-12 DIAGNOSIS — K219 Gastro-esophageal reflux disease without esophagitis: Secondary | ICD-10-CM

## 2015-07-12 DIAGNOSIS — D509 Iron deficiency anemia, unspecified: Secondary | ICD-10-CM

## 2015-07-12 DIAGNOSIS — K589 Irritable bowel syndrome without diarrhea: Secondary | ICD-10-CM | POA: Diagnosis not present

## 2015-07-12 LAB — BASIC METABOLIC PANEL
BUN: 25 mg/dL — ABNORMAL HIGH (ref 6–23)
CO2: 29 mEq/L (ref 19–32)
Calcium: 9.6 mg/dL (ref 8.4–10.5)
Chloride: 105 mEq/L (ref 96–112)
Creatinine, Ser: 1.14 mg/dL (ref 0.40–1.20)
GFR: 49.9 mL/min — ABNORMAL LOW (ref >60.00)
Glucose, Bld: 105 mg/dL — ABNORMAL HIGH (ref 70–99)
Potassium: 4.4 mEq/L (ref 3.5–5.1)
Sodium: 140 mEq/L (ref 135–145)

## 2015-07-12 LAB — VITAMIN B12: Vitamin B-12: 208 pg/mL — ABNORMAL LOW (ref 211–911)

## 2015-07-12 LAB — IBC PANEL
Iron: 31 ug/dL — ABNORMAL LOW (ref 42–145)
Saturation Ratios: 7.1 % — ABNORMAL LOW (ref 20.0–50.0)
Transferrin: 314 mg/dL (ref 212.0–360.0)

## 2015-07-12 LAB — CBC WITH DIFFERENTIAL/PLATELET
Basophils Absolute: 0.1 10*3/uL (ref 0.0–0.1)
Basophils Relative: 0.7 % (ref 0.0–3.0)
Eosinophils Absolute: 0.3 10*3/uL (ref 0.0–0.7)
Eosinophils Relative: 4 % (ref 0.0–5.0)
HCT: 36.7 % (ref 36.0–46.0)
Hemoglobin: 11.5 g/dL — ABNORMAL LOW (ref 12.0–15.0)
Lymphocytes Relative: 22.7 % (ref 12.0–46.0)
Lymphs Abs: 1.7 10*3/uL (ref 0.7–4.0)
MCHC: 31.3 g/dL (ref 30.0–36.0)
MCV: 76.7 fl — ABNORMAL LOW (ref 78.0–100.0)
Monocytes Absolute: 0.8 10*3/uL (ref 0.1–1.0)
Monocytes Relative: 10.3 % (ref 3.0–12.0)
Neutro Abs: 4.5 10*3/uL (ref 1.4–7.7)
Neutrophils Relative %: 62.3 % (ref 43.0–77.0)
Platelets: 251 10*3/uL (ref 150.0–400.0)
RBC: 4.79 Mil/uL (ref 3.87–5.11)
RDW: 20.8 % — ABNORMAL HIGH (ref 11.5–15.5)
WBC: 7.3 10*3/uL (ref 4.0–10.5)

## 2015-07-12 LAB — FERRITIN: Ferritin: 21.1 ng/mL (ref 10.0–291.0)

## 2015-07-12 LAB — FOLATE: Folate: 14.2 ng/mL (ref >5.9)

## 2015-07-12 MED ORDER — OMEPRAZOLE 40 MG PO CPDR: 40.0000 mg | DELAYED_RELEASE_CAPSULE | Freq: Two times a day (BID) | ORAL | Status: DC

## 2015-07-12 NOTE — Patient Instructions (Addendum)
You have been scheduled for an endoscopy and colonoscopy. Please follow the written instructions given to you at your visit today. Please pick up your prep supplies at the pharmacy within the next 1-3 days. If you use inhalers (even only as needed), please bring them with you on the day of your procedure. Your physician has requested that you go to www.startemmi.com and enter the access code given to you at your visit today. This web site gives a general overview about your procedure. However, you should still follow specific instructions given to you by our office regarding your preparation for the procedure.  Go to the basement for labs  We have given you VSL #3 samples 1 packet in juice and water once daily for 1 month We have increased your omeprazole to twice a day new prescription sent to your pharmacy

## 2015-07-12 NOTE — Progress Notes (Signed)
[name]    [id number]    [birth year]  Primary Care Physician:GATES,DONNA RUTH, MD  Referring Physician: Darcus Austin, MD [place], [place] [address]  Chief complaint:  Anemia  HPI: 70 year old female with history of CAD, status post CABGx 5 vessel in October 2016, CHF status post CRT, IBS here for follow-up visit for evaluation of chronic anemia. Patient had drop in hemoglobin in the perioperative period to hemoglobin of 8 and has since then, is up to 10/11 in the past 6 months. Patient denies any overt GI bleed. Her last colonoscopy was in 2004, patient did not feel she'll be able to tolerate the bowel prep and opted to do Cologaurd at last visit in 2017. Per patient she mailed the kit but the company hasn't received it and hence don't have the results on it. Review of system positive for intermittent heartburn, she feels once a day Protonix is not helping. Denies any dysphagia or odynophagia. She has alternating constipation and diarrhea on some days. She fractured her ankle and is currently in a cast for 6 weeks.   Outpatient Encounter Prescriptions as of 07/12/2015  Medication Sig  . albuterol (PROVENTIL HFA;VENTOLIN HFA) 108 (90 BASE) MCG/ACT inhaler Inhale 2 puffs into the lungs every 4 (four) hours as needed for wheezing (wheezing).   [name] aspirin EC 325 MG EC tablet Take 1 tablet (325 mg total) by mouth daily.  . carvedilol (COREG) 6.25 MG tablet Take 1 tablet (6.25 mg total) by mouth 2 (two) times daily with a meal.  . cetirizine (ZYRTEC) 10 MG tablet Take 10 mg by mouth daily.   . clotrimazole (MYCELEX) 10 MG troche Take 10 mg by mouth every 4 (four) hours as needed (mouth pain).  . fluticasone (FLONASE) 50 MCG/ACT nasal spray Place 1 spray into the nose 2 (two) times daily as needed for rhinitis or allergies.   [name] HYDROcodone-acetaminophen (NORCO/VICODIN) 5-325 MG tablet Take 1-2 tablets by mouth every 4 (four) hours as needed for moderate  pain or severe pain.  [name] KLOR-CON M20 20 MEQ tablet TAKE ONE TABLET BY MOUTH ONCE DAILY (Patient taking differently: TAKE 20 MEQ BY MOUTH ONCE DAILY)  . omeprazole (PRILOSEC) 40 MG capsule Take 1 capsule (40 mg total) by mouth daily.  . pravastatin (PRAVACHOL) 80 MG tablet Take 1 tablet (80 mg total) by mouth every evening.  [name] spironolactone (ALDACTONE) 25 MG tablet Take 25 mg by mouth daily.  . SYMBICORT 160-4.5 MCG/ACT inhaler Inhale 2 puffs into the lungs 2 (two) times daily.  [name] torsemide (DEMADEX) 20 MG tablet Take 40 mg by mouth 2 (two) times daily.  [name] triamcinolone cream (KENALOG) 0.1 % Apply 1 application topically daily as needed (affected area).    No facility-administered encounter medications on file as of 07/12/2015.    Allergies as of 07/12/2015 - Review Complete 07/12/2015  Allergen Reaction Noted  . Ivp dye [iodinated diagnostic agents] Shortness Of Breath 10/13/2010  . Shellfish-derived products Anaphylaxis 10/13/2010  . Sulfa antibiotics Shortness Of Breath   . Iodine Hives 10/13/2010  . Cephalexin Itching and Rash 04/30/2015  . Zithromax [azithromycin] Rash 06/25/2014    Past Medical History  Diagnosis Date  . Asthma   . Hypertension   . Arthritis   . GERD (gastroesophageal reflux disease)   . Diverticulosis   . Anemia   . CAD (coronary artery disease)   . Fatty liver disease, nonalcoholic   .  Internal hemorrhoids   . Gastritis   . Hyperplastic colon polyp   . OSA (obstructive sleep apnea)   . ICD (implantable cardiac defibrillator) in place   . Shortness of breath   . IBS (irritable bowel syndrome)   . H/O hiatal hernia   . Obesity   . Diabetes mellitus without complication ([place])   . Cellulitis and abscess of foot 12/2014    RT FOOT    Past Surgical History  Procedure Laterality Date  . Pacemaker insertion  2001    BS BivICD  . Cardiac defibrillator placement    . Appendectomy    . Knee surgery      right  . Ankle surgery      right  . Tubal  ligation    . Cervical spine surgery      plate   . Coronary angiogram  2010  . Abdominal ultrasound  12/01/2011    Peripelvic cysts- #1- 2.4x1.9x2.3cm, #2-1.2x0.9x1.2cm  . Cardiac catheterization  05/17/1999    No significant coronary obstructive disease w/ mild 20% luminal irregularity of the first diag branch of the LAD  . Cardiac catheterization  07/08/2002    No significant CAD, moderately depressed LV systolic function  . Cardiac catheterization Bilateral 04/26/2007    Normal findings, recommend medical therapy  . Cardiac catheterization  02/18/2008    Moderate CAD, would benefit from having a functional study, recommend continue medical therapy  . Cardiac catheterization  07/23/2012    Medical therapy  . Lexiscan myoview  11/14/2011    Mild-moderate defect seen in Mid Inferolateral and Mid Anterolateral regions-consistant w/ infarct/scar. No significant ischemia demonstrated.  . Transthoracic echocardiogram  07/23/2012    EF 55-60%, normal-mild  . Left heart catheterization with coronary angiogram N/A 07/23/2012    Procedure: LEFT HEART CATHETERIZATION WITH CORONARY ANGIOGRAM;  Surgeon: Leonie Man, MD;  Location: [facility] C[facility];  Service: Cardiovascular;  Laterality: N/A;  . Biv pacemaker generator change out N/A 11/02/2012    Procedure: BIV PACEMAKER GENERATOR CHANGE OUT;  Surgeon: Sanda Klein, MD;  Location: [facility] CATH LAB;  Service: Cardiovascular;  Laterality: N/A;  . Cardiac catheterization N/A 11/24/2014    Procedure: Left Heart Cath and Coronary Angiography;  Surgeon: Troy Sine, MD;  Location: [facility] CV LAB;  Service: Cardiovascular;  Laterality: N/A;  . Cardiac catheterization  11/27/2014    Procedure: Intravascular Pressure Wire/FFR Study;  Surgeon: Peter M Martinique, MD;  Location: [facility];  Service: Cardiovascular;;  . Coronary artery bypass graft N/A 11/29/2014    Procedure: CORONARY ARTERY BYPASS GRAFTING x 5 (LIMA-LAD, SVG-D, SVG-OM1-OM2, SVG-PD);  Surgeon:  Melrose Nakayama, MD;  Location: [facility];  Service: Open Heart Surgery;  Laterality: N/A;  . Tee without cardioversion N/A 11/29/2014    Procedure: TRANSESOPHAGEAL ECHOCARDIOGRAM (TEE);  Surgeon: Melrose Nakayama, MD;  Location: [facility];  Service: Open Heart Surgery;  Laterality: N/A;    Family History  Problem Relation Age of Onset  . Breast cancer Mother   . Diabetes Mother   . Heart disease Maternal Grandmother   . Kidney disease Maternal Grandmother   . Diabetes Maternal Grandmother   . Glaucoma Maternal Aunt   . Heart disease Maternal Aunt   . Asthma Sister     Social History   Social History  . Marital Status: Widowed    Spouse Name: N/A  . Number of Children: 5  . Years of Education: N/A   Occupational History  . STAFF/BUFFET [employer]   Social History Main Topics  . Smoking status: Former Smoker -- 0.25 packs/day for 3 years    Types: Cigarettes    Quit date: 02/11/1968  . Smokeless tobacco: Never Used  . Alcohol Use: No  . Drug Use: No  . Sexual Activity: No   Other Topics Concern  . Not on file   Social History Narrative   Widowed last year. She lives with her two sons.      Review of systems: Review of Systems  Constitutional: Negative for fever and chills.  HENT: Negative.   Eyes: Negative for blurred vision.  Respiratory: Negative for cough, shortness of breath and wheezing.   Cardiovascular: Negative for chest pain and palpitations.  Gastrointestinal: as per HPI Genitourinary: Negative for dysuria, urgency, frequency and hematuria.  Musculoskeletal: Negative for myalgias, back pain and joint pain.  Skin: Negative for itching and rash.  Neurological: Negative for dizziness, tremors, focal weakness, seizures and loss of consciousness.  Endo/Heme/Allergies: Negative for environmental allergies.  Psychiatric/Behavioral: Negative for depression, suicidal ideas and hallucinations.  All other systems reviewed and are  negative.   Physical Exam: Filed Vitals:   07/12/15 1034  BP: 100/74  Pulse: 80   Gen:      No acute distress, obese HEENT:  EOMI, sclera anicteric Neck:     No masses; no thyromegaly Lungs:    Clear to auscultation bilaterally; normal respiratory effort CV:         Regular rate and rhythm; no murmurs Abd:      + bowel sounds; soft, non-tender; no palpable masses, no distension Ext:    No edema; adequate peripheral perfusion; L lower leg in cast. Small blister with surrounding redness on R lower leg Skin:      Warm and dry; no rash Neuro: alert and oriented x 3 Psych: normal mood and affect  Data Reviewed:  Reviewed chart in epic   Assessment and Plan/Recommendations:  70 year old female with history of CAD, status post CABGx 5 vessel in October 2016, CHF status post CRT with normalization of cardiac function, most recent echo EF 55-60%, GERD, irritable bowel syndrome, here for follow-up of iron deficiency anemia with ferritin of 16 and iron saturation of 6. Patient is very hesitant to pursue colonoscopy because of the bowel prep, but discussed in detail the benefits and risks of the endoscopic evaluation and she agreed to go ahead with scheduling for EGD and colonoscopy Increase Protonix to 40 mg twice daily, 30 minutes before breakfast and dinner She is intentionally losing weight with dietary changes, has lost about 4 pounds in the past 1 week IBS with alternating constipation and diarrhea: Start VSL #3 regular strength 1 packet daily for 4-6 weeks Check CBC, BMP, iron panel, B12 and folate Return as needed after the procedures   K. Denzil Magnuson , MD [phone] Mon-Fri 8a-5p [phone] after 5p, weekends, holidays  CC: Darcus Austin, MD

## 2015-07-13 ENCOUNTER — Ambulatory Visit (HOSPITAL_COMMUNITY): Payer: Medicare Other

## 2015-07-16 ENCOUNTER — Ambulatory Visit (HOSPITAL_COMMUNITY): Payer: Medicare Other

## 2015-07-18 ENCOUNTER — Ambulatory Visit (HOSPITAL_COMMUNITY): Payer: Medicare Other

## 2015-07-20 ENCOUNTER — Ambulatory Visit (HOSPITAL_COMMUNITY): Payer: Medicare Other

## 2015-07-20 ENCOUNTER — Other Ambulatory Visit: Payer: Self-pay

## 2015-07-20 DIAGNOSIS — D509 Iron deficiency anemia, unspecified: Secondary | ICD-10-CM

## 2015-07-23 ENCOUNTER — Ambulatory Visit (HOSPITAL_COMMUNITY): Payer: Medicare Other

## 2015-07-25 ENCOUNTER — Ambulatory Visit (HOSPITAL_COMMUNITY): Payer: Medicare Other

## 2015-07-27 ENCOUNTER — Ambulatory Visit (HOSPITAL_COMMUNITY): Payer: Medicare Other

## 2015-07-30 ENCOUNTER — Ambulatory Visit (HOSPITAL_COMMUNITY): Payer: Medicare Other

## 2015-08-01 ENCOUNTER — Ambulatory Visit (HOSPITAL_COMMUNITY): Payer: Medicare Other

## 2015-08-03 ENCOUNTER — Ambulatory Visit (HOSPITAL_COMMUNITY): Payer: Medicare Other

## 2015-08-06 ENCOUNTER — Ambulatory Visit (HOSPITAL_COMMUNITY): Payer: Medicare Other

## 2015-08-08 ENCOUNTER — Ambulatory Visit (HOSPITAL_COMMUNITY): Payer: Medicare Other

## 2015-08-10 ENCOUNTER — Other Ambulatory Visit (HOSPITAL_COMMUNITY): Payer: Self-pay | Admitting: Cardiology

## 2015-08-10 ENCOUNTER — Ambulatory Visit (HOSPITAL_COMMUNITY): Payer: Medicare Other

## 2015-08-10 NOTE — Telephone Encounter (Signed)
carvedilol (COREG) 6.25 MG tablet  Medication   Date: 03/19/2015  Department: [facility]  Ordering/Authorizing: Larey Dresser, MD      Order Providers    Prescribing Provider Encounter Provider   Larey Dresser, MD Scarlette Calico, RN    Medication Detail      Disp Refills Start End     carvedilol (COREG) 6.25 MG tablet 60 tablet 3 03/19/2015     Sig - Route: Take 1 tablet (6.25 mg total) by mouth 2 (two) times daily with a meal. - Oral    E-Prescribing Status: Receipt confirmed by pharmacy (03/19/2015 10:51 AM EST)     Pharmacy    [facility] [place], [place] [id number]

## 2015-08-13 ENCOUNTER — Ambulatory Visit (HOSPITAL_COMMUNITY): Payer: Medicare Other

## 2015-08-15 ENCOUNTER — Ambulatory Visit (HOSPITAL_COMMUNITY): Payer: Medicare Other

## 2015-08-17 ENCOUNTER — Ambulatory Visit (HOSPITAL_COMMUNITY): Payer: Medicare Other

## 2015-08-20 ENCOUNTER — Ambulatory Visit (HOSPITAL_COMMUNITY): Payer: Medicare Other

## 2015-08-21 DIAGNOSIS — H524 Presbyopia: Secondary | ICD-10-CM | POA: Diagnosis not present

## 2015-08-21 DIAGNOSIS — H04123 Dry eye syndrome of bilateral lacrimal glands: Secondary | ICD-10-CM | POA: Diagnosis not present

## 2015-08-21 DIAGNOSIS — H5213 Myopia, bilateral: Secondary | ICD-10-CM | POA: Diagnosis not present

## 2015-08-21 DIAGNOSIS — H52223 Regular astigmatism, bilateral: Secondary | ICD-10-CM | POA: Diagnosis not present

## 2015-08-21 DIAGNOSIS — E119 Type 2 diabetes mellitus without complications: Secondary | ICD-10-CM | POA: Diagnosis not present

## 2015-08-22 ENCOUNTER — Ambulatory Visit (HOSPITAL_COMMUNITY): Payer: Medicare Other

## 2015-08-24 ENCOUNTER — Ambulatory Visit (HOSPITAL_COMMUNITY): Payer: Medicare Other

## 2015-08-27 ENCOUNTER — Ambulatory Visit (HOSPITAL_COMMUNITY): Payer: Medicare Other

## 2015-08-27 ENCOUNTER — Encounter: Payer: Self-pay | Admitting: Gastroenterology

## 2015-08-29 ENCOUNTER — Ambulatory Visit (HOSPITAL_COMMUNITY): Payer: Medicare Other

## 2015-08-31 ENCOUNTER — Ambulatory Visit (HOSPITAL_COMMUNITY): Payer: Medicare Other

## 2015-09-03 ENCOUNTER — Ambulatory Visit (HOSPITAL_COMMUNITY): Payer: Medicare Other

## 2015-09-05 ENCOUNTER — Ambulatory Visit (HOSPITAL_COMMUNITY): Payer: Medicare Other

## 2015-09-06 ENCOUNTER — Encounter: Payer: Medicare Other | Admitting: Gastroenterology

## 2015-09-07 ENCOUNTER — Ambulatory Visit (HOSPITAL_COMMUNITY): Payer: Medicare Other

## 2015-09-10 ENCOUNTER — Ambulatory Visit (HOSPITAL_COMMUNITY): Payer: Medicare Other

## 2015-09-12 ENCOUNTER — Other Ambulatory Visit (HOSPITAL_COMMUNITY): Payer: Self-pay | Admitting: Cardiology

## 2015-09-12 ENCOUNTER — Ambulatory Visit (HOSPITAL_COMMUNITY): Payer: Medicare Other

## 2015-09-13 DIAGNOSIS — Z91013 Allergy to seafood: Secondary | ICD-10-CM | POA: Diagnosis not present

## 2015-09-13 DIAGNOSIS — R109 Unspecified abdominal pain: Secondary | ICD-10-CM | POA: Diagnosis not present

## 2015-09-13 DIAGNOSIS — J4520 Mild intermittent asthma, uncomplicated: Secondary | ICD-10-CM | POA: Diagnosis not present

## 2015-09-13 DIAGNOSIS — N3000 Acute cystitis without hematuria: Secondary | ICD-10-CM | POA: Diagnosis not present

## 2015-09-14 ENCOUNTER — Ambulatory Visit (HOSPITAL_COMMUNITY): Payer: Medicare Other

## 2015-09-17 ENCOUNTER — Ambulatory Visit (HOSPITAL_COMMUNITY): Payer: Medicare Other

## 2015-09-18 ENCOUNTER — Other Ambulatory Visit: Payer: Self-pay | Admitting: Family Medicine

## 2015-09-18 DIAGNOSIS — R109 Unspecified abdominal pain: Secondary | ICD-10-CM

## 2015-09-19 ENCOUNTER — Ambulatory Visit
Admission: RE | Admit: 2015-09-19 | Discharge: 2015-09-19 | Disposition: A | Discharge status: Home / Self Care | Payer: Commercial Managed Care - HMO | Source: Ambulatory Visit | Attending: Family Medicine | Admitting: Family Medicine

## 2015-09-19 ENCOUNTER — Ambulatory Visit (HOSPITAL_COMMUNITY): Payer: Medicare Other

## 2015-09-19 DIAGNOSIS — R109 Unspecified abdominal pain: Secondary | ICD-10-CM

## 2015-09-21 ENCOUNTER — Ambulatory Visit (HOSPITAL_COMMUNITY): Payer: Medicare Other

## 2015-09-24 ENCOUNTER — Ambulatory Visit (HOSPITAL_COMMUNITY): Payer: Medicare Other

## 2015-09-26 ENCOUNTER — Ambulatory Visit (HOSPITAL_COMMUNITY): Payer: Medicare Other

## 2015-09-26 DIAGNOSIS — H52223 Regular astigmatism, bilateral: Secondary | ICD-10-CM | POA: Diagnosis not present

## 2015-09-26 DIAGNOSIS — E119 Type 2 diabetes mellitus without complications: Secondary | ICD-10-CM | POA: Diagnosis not present

## 2015-09-26 DIAGNOSIS — H524 Presbyopia: Secondary | ICD-10-CM | POA: Diagnosis not present

## 2015-09-26 DIAGNOSIS — H5213 Myopia, bilateral: Secondary | ICD-10-CM | POA: Diagnosis not present

## 2015-09-26 DIAGNOSIS — H04123 Dry eye syndrome of bilateral lacrimal glands: Secondary | ICD-10-CM | POA: Diagnosis not present

## 2015-09-28 ENCOUNTER — Ambulatory Visit (HOSPITAL_COMMUNITY): Payer: Medicare Other

## 2015-10-01 ENCOUNTER — Ambulatory Visit (HOSPITAL_COMMUNITY): Payer: Medicare Other

## 2015-10-03 ENCOUNTER — Ambulatory Visit (HOSPITAL_COMMUNITY): Payer: Medicare Other

## 2015-10-05 ENCOUNTER — Ambulatory Visit (HOSPITAL_COMMUNITY): Payer: Medicare Other

## 2015-10-07 IMAGING — DX DG FOOT COMPLETE 3+V*R*
3 series · 3 of 3 positions shown · non-contrast
Comparison: None.

CLINICAL DATA: Pain in the right foot.  Unable to bear weight.

EXAM:
RIGHT FOOT COMPLETE - 3+ VIEW

[foot ap]
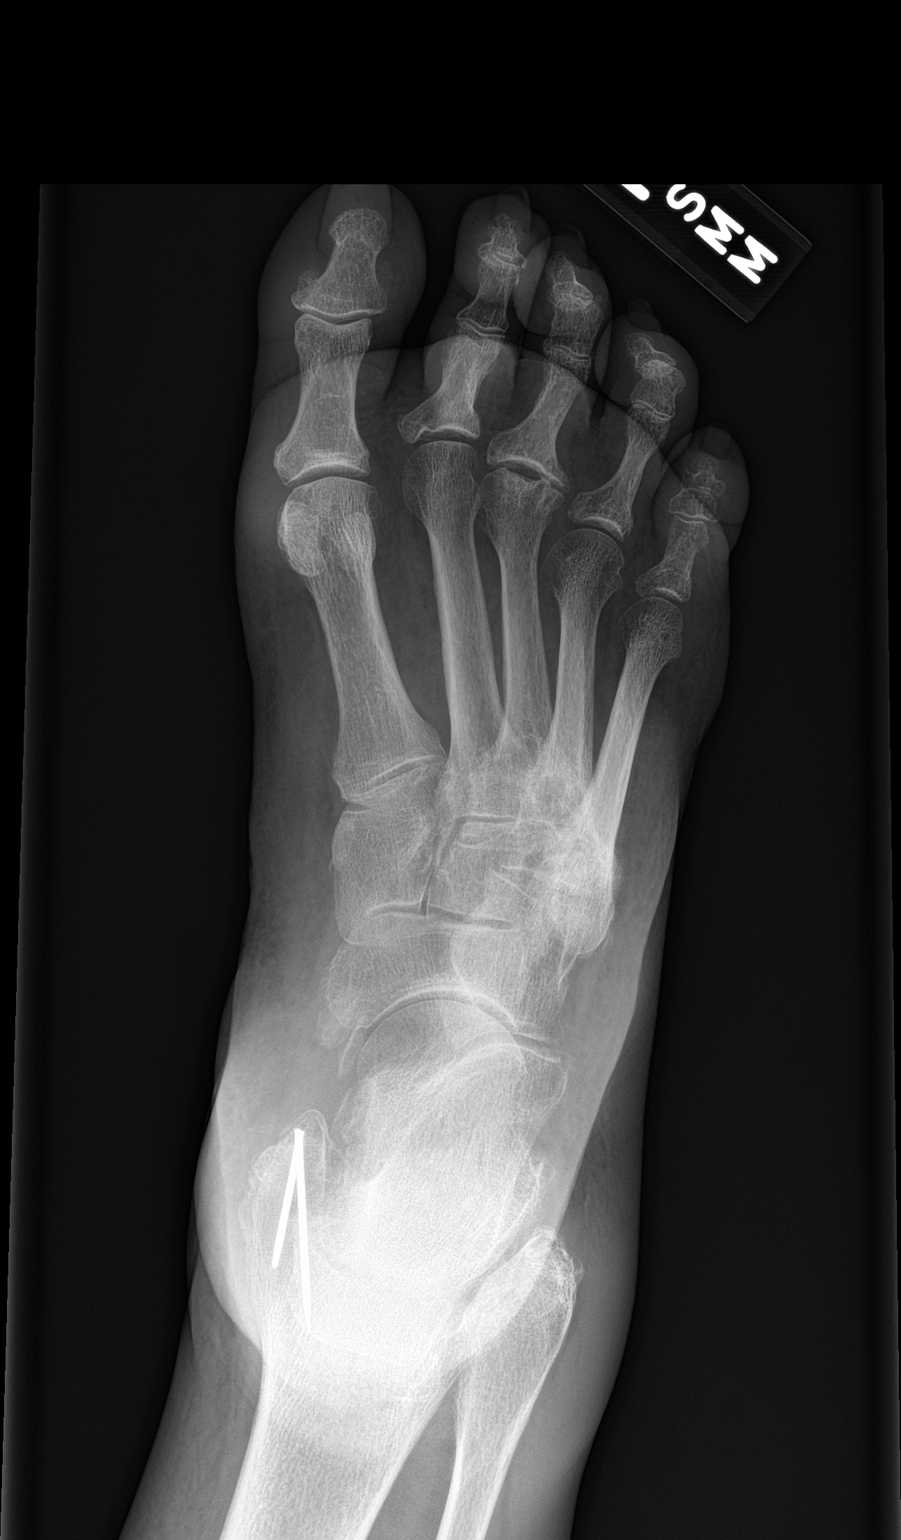

[foot obl]
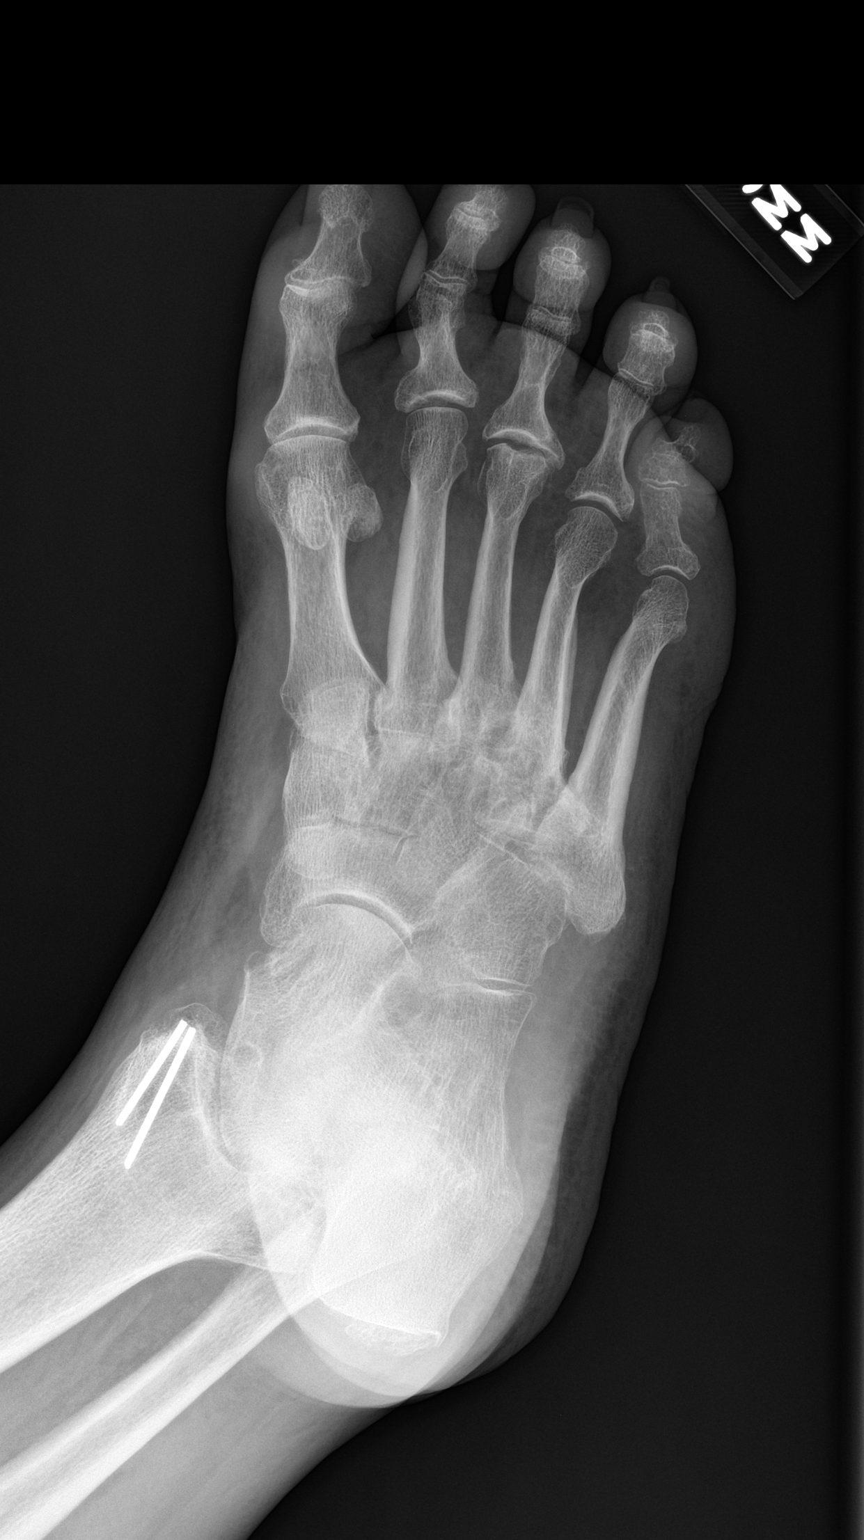

[foot lat]
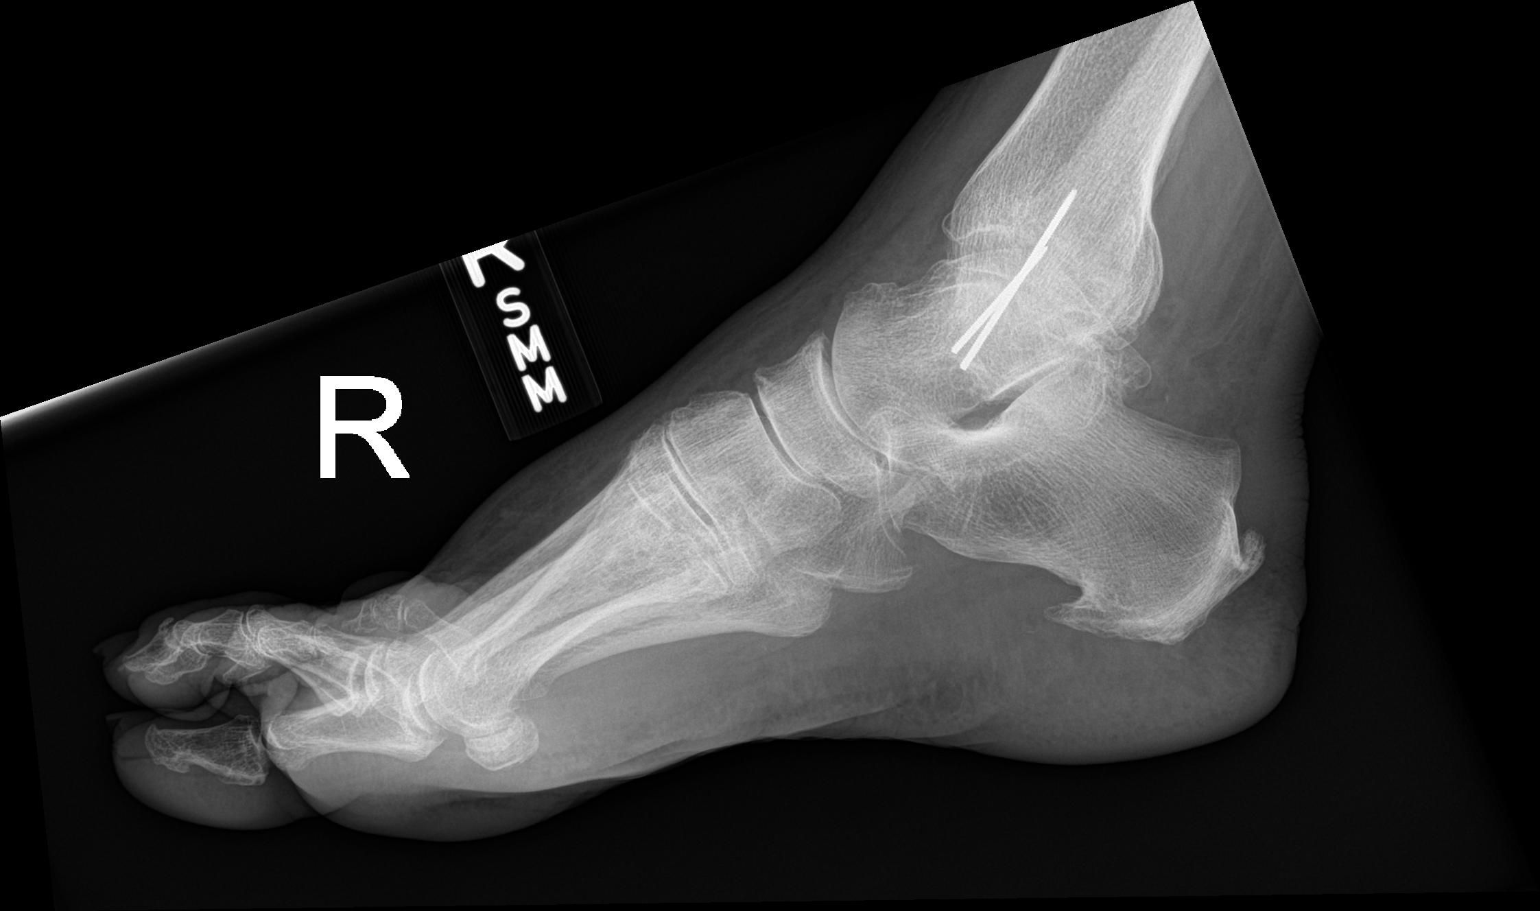

[3 of 3 positions shown; findings below may reference images not displayed]

FINDINGS: There is no evidence of fracture or dislocation. There are erosive
changes with chronic appearance of the distal third metatarsal shaft
at the metatarsophalangeal joint, which may be degenerative or
posttraumatic. Osteophytosis of the calcaneus is noted. Prior
fixation of the medial malleolus is seen. There is diffuse soft
tissue edema.
IMPRESSION: No evidence of acute fracture subluxation.

Chronic appearing erosive changes at the third metatarsophalangeal
joint.

Soft tissue edema.

## 2015-10-08 ENCOUNTER — Ambulatory Visit (HOSPITAL_COMMUNITY): Payer: Medicare Other

## 2015-10-09 ENCOUNTER — Ambulatory Visit (HOSPITAL_COMMUNITY)
Admission: RE | Admit: 2015-10-09 | Discharge: 2015-10-09 | Disposition: A | Discharge status: Home / Self Care | Payer: Commercial Managed Care - HMO | Source: Ambulatory Visit | Attending: Cardiology | Admitting: Cardiology

## 2015-10-09 VITALS — BP 158/88 | HR 84 | Wt 246.8 lb

## 2015-10-09 DIAGNOSIS — Z833 Family history of diabetes mellitus: Secondary | ICD-10-CM | POA: Diagnosis not present

## 2015-10-09 DIAGNOSIS — Z91041 Radiographic dye allergy status: Secondary | ICD-10-CM | POA: Insufficient documentation

## 2015-10-09 DIAGNOSIS — Z7982 Long term (current) use of aspirin: Secondary | ICD-10-CM | POA: Insufficient documentation

## 2015-10-09 DIAGNOSIS — K219 Gastro-esophageal reflux disease without esophagitis: Secondary | ICD-10-CM | POA: Diagnosis not present

## 2015-10-09 DIAGNOSIS — Z803 Family history of malignant neoplasm of breast: Secondary | ICD-10-CM | POA: Diagnosis not present

## 2015-10-09 DIAGNOSIS — I429 Cardiomyopathy, unspecified: Secondary | ICD-10-CM | POA: Diagnosis not present

## 2015-10-09 DIAGNOSIS — I5032 Chronic diastolic (congestive) heart failure: Secondary | ICD-10-CM | POA: Insufficient documentation

## 2015-10-09 DIAGNOSIS — I5033 Acute on chronic diastolic (congestive) heart failure: Secondary | ICD-10-CM

## 2015-10-09 DIAGNOSIS — G4733 Obstructive sleep apnea (adult) (pediatric): Secondary | ICD-10-CM | POA: Insufficient documentation

## 2015-10-09 DIAGNOSIS — Z882 Allergy status to sulfonamides status: Secondary | ICD-10-CM | POA: Insufficient documentation

## 2015-10-09 DIAGNOSIS — I130 Hypertensive heart and chronic kidney disease with heart failure and stage 1 through stage 4 chronic kidney disease, or unspecified chronic kidney disease: Secondary | ICD-10-CM | POA: Diagnosis not present

## 2015-10-09 DIAGNOSIS — Z95810 Presence of automatic (implantable) cardiac defibrillator: Secondary | ICD-10-CM | POA: Insufficient documentation

## 2015-10-09 DIAGNOSIS — Z87891 Personal history of nicotine dependence: Secondary | ICD-10-CM | POA: Diagnosis not present

## 2015-10-09 DIAGNOSIS — K760 Fatty (change of) liver, not elsewhere classified: Secondary | ICD-10-CM | POA: Diagnosis not present

## 2015-10-09 DIAGNOSIS — N183 Chronic kidney disease, stage 3 unspecified: Secondary | ICD-10-CM

## 2015-10-09 DIAGNOSIS — Z881 Allergy status to other antibiotic agents status: Secondary | ICD-10-CM | POA: Diagnosis not present

## 2015-10-09 DIAGNOSIS — Z951 Presence of aortocoronary bypass graft: Secondary | ICD-10-CM | POA: Insufficient documentation

## 2015-10-09 DIAGNOSIS — Z6841 Body Mass Index (BMI) 40.0 and over, adult: Secondary | ICD-10-CM | POA: Insufficient documentation

## 2015-10-09 DIAGNOSIS — E1122 Type 2 diabetes mellitus with diabetic chronic kidney disease: Secondary | ICD-10-CM | POA: Insufficient documentation

## 2015-10-09 DIAGNOSIS — Z7951 Long term (current) use of inhaled steroids: Secondary | ICD-10-CM | POA: Diagnosis not present

## 2015-10-09 DIAGNOSIS — J45909 Unspecified asthma, uncomplicated: Secondary | ICD-10-CM | POA: Diagnosis not present

## 2015-10-09 DIAGNOSIS — I2510 Atherosclerotic heart disease of native coronary artery without angina pectoris: Secondary | ICD-10-CM | POA: Diagnosis not present

## 2015-10-09 DIAGNOSIS — Z825 Family history of asthma and other chronic lower respiratory diseases: Secondary | ICD-10-CM | POA: Insufficient documentation

## 2015-10-09 DIAGNOSIS — Z79899 Other long term (current) drug therapy: Secondary | ICD-10-CM | POA: Diagnosis not present

## 2015-10-09 DIAGNOSIS — M1990 Unspecified osteoarthritis, unspecified site: Secondary | ICD-10-CM | POA: Insufficient documentation

## 2015-10-09 DIAGNOSIS — I459 Conduction disorder, unspecified: Secondary | ICD-10-CM | POA: Insufficient documentation

## 2015-10-09 DIAGNOSIS — I428 Other cardiomyopathies: Secondary | ICD-10-CM

## 2015-10-09 NOTE — Progress Notes (Signed)
Patient ID: [name], female   DOB: 1945-02-28, 70 y.o.   MRN: [id number]  ADVANCED HF CLINIC NOTE  Patient ID: [name], female   DOB: 09-20-1945, 70 y.o.   MRN: [id number]  Primary Cardiologist: Dr. Dani Gobble Croitoru Primary HF: Dr. Haroldine Laws   HPI: Ms Haut is a 70 year old with history of morbid obesity, DM, HTN, asthma, OSA, ICD, GERD, arthritis, CRT-D 2008 [employer], CAD, S/P CABG x5 11/29/2014   Discharged from [facility] 12/07/14 s/p CABG. Discharge weight 252.  Went to [facility] SN[facility].  Readmitted 12/21/14 with RLE cellulitis and volume overload with weight of 273 and complaints of dyspnea and orthopnea. RLE cellulitis treated with IV ABX. She was had brisk diuresis with lasix gtt. Diuresis slowed down with slight Cr bump. Acetazolamide added and monitored closely with history of sulfa allergy. Diuresed well, but lasix held with continued Cr bump. Transitioned to po torsemide for d/c. Discharge weight 246 lbs.  Today she returns for HF follow up. Overall feeling ok. Says she has cyst on L breast that causes some discomfort. She has not required NTG. Today she was able to walk in the clinic without dyspnea. Usually needs a wheel chair. Weight at home at home 236-246 pounds. Says she takes an 20 mg torsemide once a week.  At night she complains of pain in her legs. Trying to follow low salt diet. Taking all medications.    Echo 11/24/14 EF 55-60%, Grade 1 DD. Mild TR TEE 11/29/14 EF 55-60%, LVH, Mild AR and MR LHC 11/2014   Prox RCA lesion, 85% stenosed.  Mid RCA lesion, 90% stenosed.  1st Mrg lesion, 85% stenosed.  Dist Cx lesion, 60% stenosed.  Ost 1st Diag lesion, 70% stenosed.  1st Diag lesion, 70% stenosed.  Ost 2nd Diag to 2nd Diag lesion, 70% stenosed.  Mid LAD to Dist LAD lesion, 50% stenosed.  Labs 02/21/2015: K 4.5 Creatinine 1.39  Labs 07/12/2015: K 4.4 Creatinine 1.14 Hgb 11.5   Past Medical History:  Diagnosis Date  . Anemia   . Arthritis   .  Asthma   . CAD (coronary artery disease)   . Cellulitis and abscess of foot 12/2014   RT FOOT  . Diabetes mellitus without complication ([place])   . Diverticulosis   . Fatty liver disease, nonalcoholic   . Gastritis   . GERD (gastroesophageal reflux disease)   . H/O hiatal hernia   . Hyperplastic colon polyp   . Hypertension   . IBS (irritable bowel syndrome)   . ICD (implantable cardiac defibrillator) in place   . Internal hemorrhoids   . Obesity   . OSA (obstructive sleep apnea)   . Shortness of breath     Current Outpatient Prescriptions  Medication Sig Dispense Refill  . albuterol (PROVENTIL HFA;VENTOLIN HFA) 108 (90 BASE) MCG/ACT inhaler Inhale 2 puffs into the lungs every 4 (four) hours as needed for wheezing (wheezing).     [name] aspirin EC 325 MG EC tablet Take 1 tablet (325 mg total) by mouth daily. 30 tablet 0  . carvedilol (COREG) 6.25 MG tablet TAKE ONE TABLET BY MOUTH TWICE DAILY WITH A MEAL 60 tablet 0  . cetirizine (ZYRTEC) 10 MG tablet Take 10 mg by mouth daily.     . clotrimazole (MYCELEX) 10 MG troche Take 10 mg by mouth every 4 (four) hours as needed (mouth pain).    . fluticasone (FLONASE) 50 MCG/ACT nasal spray Place 1 spray into the nose 2 (two) times daily as  needed for rhinitis or allergies.     [name] HYDROcodone-acetaminophen (NORCO/VICODIN) 5-325 MG tablet Take 1-2 tablets by mouth every 4 (four) hours as needed for moderate pain or severe pain. 15 tablet 0  . KLOR-CON M20 20 MEQ tablet TAKE ONE TABLET BY MOUTH ONCE DAILY (Patient taking differently: TAKE 20 MEQ BY MOUTH ONCE DAILY) 60 tablet 4  . omeprazole (PRILOSEC) 40 MG capsule Take 1 capsule (40 mg total) by mouth 2 (two) times daily before a meal. 60 capsule 11  . pravastatin (PRAVACHOL) 80 MG tablet Take 1 tablet (80 mg total) by mouth every evening. 90 tablet 2  . spironolactone (ALDACTONE) 25 MG tablet Take 25 mg by mouth daily.    . SYMBICORT 160-4.5 MCG/ACT inhaler Inhale 2 puffs into the lungs 2 (two)  times daily.    [name] torsemide (DEMADEX) 20 MG tablet Take 40 mg by mouth 2 (two) times daily.    [name] triamcinolone cream (KENALOG) 0.1 % Apply 1 application topically daily as needed (affected area).      No current facility-administered medications for this encounter.     Allergies  Allergen Reactions  . Ivp Dye [Iodinated Diagnostic Agents] Shortness Of Breath    No reaction to PO contrast with non-ionic dye.06-25-2014/rsm  . Shellfish-Derived Products Anaphylaxis  . Sulfa Antibiotics Shortness Of Breath  . Iodine Hives  . Cephalexin Itching and Rash    Rash & itch after Keflex  . Zithromax [Azithromycin] Rash      Social History   Social History  . Marital status: Widowed    Spouse name: N/A  . Number of children: 5  . Years of education: N/A   Occupational History  . 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 Smoking status: Former Smoker    Packs/day: 0.25    Years: 3.00    Types: Cigarettes    Quit date: 02/11/1968  . Smokeless tobacco: Never Used  . Alcohol use No  . Drug use: No  . Sexual activity: No   Other Topics Concern  . Not on file   Social History Narrative   Widowed last year. She lives with her two sons.      Family History  Problem Relation Age of Onset  . Breast cancer Mother   . Diabetes Mother   . Heart disease Maternal Grandmother   . Kidney disease Maternal Grandmother   . Diabetes Maternal Grandmother   . Glaucoma Maternal Aunt   . Heart disease Maternal Aunt   . Asthma Sister     Vitals:   10/09/15 1353  BP: (!) 158/88  Pulse: 84  SpO2: 94%  Weight: 246 lb 12.8 oz (111.9 kg)    PHYSICAL EXAM: General:   NAD. Ambulated in the clinic today.   HEENT: normal Neck: supple.  JVD 6-7  Carotids 2+ bilat; no bruits. No thyromegaly or nodule noted. Cor: PMI nondisplaced. RRR. No M/G/R. Sternum stable.  Lungs: Clear  Abdomen: Obese, soft, NT, ND. No HSM. No bruits or masses. +BS Extremities: no cyanosis or  clubbing. No edema.   Neuro: alert & oriented x 3, cranial nerves grossly intact. moves all 4 extremities w/o difficulty. Affect pleasant.   ASSESSMENT & PLAN:  1. Chronic diastolic HF - TEE 99991111 EF 55-60%, mild LVH.  NYHA III. Volume status mildly elevated.   Continue torsemide 40 mg daily and add extra 20 mg torsemide today.  Continue potassium 20 meq daily.  2. CAD s/p CABG 11/2014 On  statin, bb, aspirin. No chest pain. Managed by her Cardiologist.  3. Morbid obesity- needs to lose weight. Discussed portion control.  4. H/o heart block s/p BiV pacer/CRT-D [employer] - with malfunction of A lead and chronic penetration of V lead into pericardium.  6. Chronic renal failure stage III- BMET today.  7. Cellulitis, left lower extremity - resolved.    BMEt today. Follow up in 6 months.  Amy Clegg,NP-C  1:57 PM

## 2015-10-09 NOTE — Patient Instructions (Signed)
Your physician recommends that you schedule a follow-up appointment in: 6 months with Dr Haroldine Laws  Do the following things EVERYDAY: 1) Weigh yourself in the morning before breakfast. Write it down and keep it in a log. 2) Take your medicines as prescribed 3) Eat low salt foods-Limit salt (sodium) to 2000 mg per day.  4) Stay as active as you can everyday 5) Limit all fluids for the day to less than 2 liters

## 2015-10-10 ENCOUNTER — Ambulatory Visit (HOSPITAL_COMMUNITY): Payer: Medicare Other

## 2015-10-12 ENCOUNTER — Other Ambulatory Visit: Payer: Self-pay | Admitting: *Deleted

## 2015-10-12 ENCOUNTER — Ambulatory Visit (HOSPITAL_COMMUNITY): Payer: Medicare Other

## 2015-10-15 ENCOUNTER — Ambulatory Visit (HOSPITAL_COMMUNITY): Payer: Medicare Other

## 2015-10-16 DIAGNOSIS — N183 Chronic kidney disease, stage 3 (moderate): Secondary | ICD-10-CM | POA: Diagnosis not present

## 2015-10-16 DIAGNOSIS — Z0100 Encounter for examination of eyes and vision without abnormal findings: Secondary | ICD-10-CM | POA: Diagnosis not present

## 2015-10-17 ENCOUNTER — Ambulatory Visit (HOSPITAL_COMMUNITY): Payer: Medicare Other

## 2015-10-18 ENCOUNTER — Ambulatory Visit (INDEPENDENT_AMBULATORY_CARE_PROVIDER_SITE_OTHER): Payer: Commercial Managed Care - HMO | Admitting: Cardiovascular Disease

## 2015-10-18 ENCOUNTER — Encounter: Payer: Self-pay | Admitting: Cardiovascular Disease

## 2015-10-18 VITALS — BP 132/74 | HR 75 | Ht 65.0 in | Wt 243.6 lb

## 2015-10-18 DIAGNOSIS — I442 Atrioventricular block, complete: Secondary | ICD-10-CM

## 2015-10-18 DIAGNOSIS — Z79899 Other long term (current) drug therapy: Secondary | ICD-10-CM | POA: Diagnosis not present

## 2015-10-18 DIAGNOSIS — I428 Other cardiomyopathies: Secondary | ICD-10-CM

## 2015-10-18 DIAGNOSIS — Z950 Presence of cardiac pacemaker: Secondary | ICD-10-CM

## 2015-10-18 DIAGNOSIS — I5032 Chronic diastolic (congestive) heart failure: Secondary | ICD-10-CM

## 2015-10-18 DIAGNOSIS — I2510 Atherosclerotic heart disease of native coronary artery without angina pectoris: Secondary | ICD-10-CM

## 2015-10-18 DIAGNOSIS — E785 Hyperlipidemia, unspecified: Secondary | ICD-10-CM | POA: Diagnosis not present

## 2015-10-18 DIAGNOSIS — I429 Cardiomyopathy, unspecified: Secondary | ICD-10-CM | POA: Diagnosis not present

## 2015-10-18 DIAGNOSIS — R0600 Dyspnea, unspecified: Secondary | ICD-10-CM

## 2015-10-18 MED ORDER — ISOSORBIDE MONONITRATE ER 30 MG PO TB24: 30.0000 mg | ORAL_TABLET | Freq: Every day | ORAL | 3 refills | Status: DC

## 2015-10-18 NOTE — Patient Instructions (Signed)
Medication Instructions: Dr [name] has recommended making the following medication changes: 1. START Isosorbide 30 mg - take 1 tablet by mouth daily  Labwork: Your physician recommends that you return for lab work TODAY.  Testing/Procedures: NONE ORDERED  Follow-up: Dr [name] recommends that you schedule a follow-up appointment in 3 months.  If you need a refill on your cardiac medications before your next appointment, please call your pharmacy.

## 2015-10-18 NOTE — Progress Notes (Signed)
Patient ID: [name], female   DOB: 1945/07/13, 70 y.o.   MRN: [id number] .    Cardiology Office Note    Date:  10/20/2015   ID:  [name], DOB May 09, 1945, MRN [id number]  PCP:  [name], MD  Cardiologist:   Sanda Klein, MD   Chief Complaint  Patient presents with  . Follow-up    pt c/o chest pain and sob    History of Present Illness:  [name] is a 70 y.o. female with CAD s/p CABG October 2016 (LIMA to LAD, SVG to D1, SVG to OM1 and OM2, SVG to PDA), diastolic heart failure, morbid obesity, adrenal insufficiency, type 2 diabetes mellitus, Hypertension, stage III chronic kidney disease, asthma, obstructive sleep apnea. Nonischemic cardiomyopathy and heart failure preceded the diagnosis of coronary disease. She has complete heart block and had a conventional pacemaker implanted in 2001. This was upgraded to a biventricular ICD implanted in 2008 for severe cardiomyopathy with ejection fraction 20%, downgraded to CRT-P in 2014 ([facility]) following excellent response to resynchronization (hyper-responder, normalization of left ventricular systolic function). The current atrial and right ventricular leads were implanted in 2001, the left ventricular lead was implanted in 2008. The defibrillator lead implanted in 2008 is capped and its tip was resected at the time of bypass surgery. Ever since bypass surgery, atrial pacing thresholds have been extremely high. Most recent echo shows ejection fraction normal at 55-60% with grade 1 diastolic dysfunction. weeks.  Today she weighs 243 pounds. His is below the previously estimated "dry weight of around 246 pounds. In the past she had signs and symptoms of hypovolemia at 237 pounds. She does complain of being short of breath, more than usual over the last 2 days. She has not heard any wheezing. She denies angina pectoris. She does not have ankle swelling. She denies orthopnea.  She was seen in the heart failure  clinic on August 29. Amy Clegg suggested isosorbide mononitrate 30 mg daily, I think primarily to see if this helps with her blood pressure which was a little elevated. She has not yet started this medication.  Interrogation of her [employer] CRT-P shows 100% biventricular pacing. She has very high atrial pacing thresholds, but almost never requires atrial pacing. Heart rate histogram distribution is normal, driven by normal sinus rhythm. Other parameters of the atrial lead are normal (sensed P wave, impedance OK). The device is programmed VDD .Estimated generator longevity is 8 years. The left ventricular lead is programmed LVtip to LVring. Heart rate histogram distribution is normal. No recorded atrial or ventricular tachyarrhythmia.  Past Medical History:  Diagnosis Date  . Anemia   . Arthritis   . Asthma   . CAD (coronary artery disease)   . Cellulitis and abscess of foot 12/2014   RT FOOT  . Diabetes mellitus without complication ([place])   . Diverticulosis   . Fatty liver disease, nonalcoholic   . Gastritis   . GERD (gastroesophageal reflux disease)   . H/O hiatal hernia   . Hyperplastic colon polyp   . Hypertension   . IBS (irritable bowel syndrome)   . ICD (implantable cardiac defibrillator) in place   . Internal hemorrhoids   . Obesity   . OSA (obstructive sleep apnea)   . Shortness of breath     Past Surgical History:  Procedure Laterality Date  . ABDOMINAL ULTRASOUND  12/01/2011   Peripelvic cysts- #1- 2.4x1.9x2.3cm, #2-1.2x0.9x1.2cm  . ANKLE SURGERY     right  .  APPENDECTOMY    . BIV PACEMAKER GENERATOR CHANGE OUT N/A 11/02/2012   Procedure: BIV PACEMAKER GENERATOR CHANGE OUT;  Surgeon: Sanda Klein, MD;  Location: [facility];  Service: Cardiovascular;  Laterality: N/A;  . CARDIAC CATHETERIZATION  05/17/1999   No significant coronary obstructive disease w/ mild 20% luminal irregularity of the first diag branch of the LAD  . CARDIAC CATHETERIZATION   07/08/2002   No significant CAD, moderately depressed LV systolic function  . CARDIAC CATHETERIZATION Bilateral 04/26/2007   Normal findings, recommend medical therapy  . CARDIAC CATHETERIZATION  02/18/2008   Moderate CAD, would benefit from having a functional study, recommend continue medical therapy  . CARDIAC CATHETERIZATION  07/23/2012   Medical therapy  . CARDIAC CATHETERIZATION N/A 11/24/2014   Procedure: Left Heart Cath and Coronary Angiography;  Surgeon: Troy Sine, MD;  Location: [facility];  Service: Cardiovascular;  Laterality: N/A;  . CARDIAC CATHETERIZATION  11/27/2014   Procedure: Intravascular Pressure Wire/FFR Study;  Surgeon: Peter M Martinique, MD;  Location: [facility];  Service: Cardiovascular;;  . CARDIAC DEFIBRILLATOR PLACEMENT    . CERVICAL SPINE SURGERY     plate   . CORONARY ANGIOGRAM  2010  . CORONARY ARTERY BYPASS GRAFT N/A 11/29/2014   Procedure: CORONARY ARTERY BYPASS GRAFTING x 5 (LIMA-LAD, SVG-D, SVG-OM1-OM2, SVG-PD);  Surgeon: Melrose Nakayama, MD;  Location: [facility];  Service: Open Heart Surgery;  Laterality: N/A;  . KNEE SURGERY     right  . LEFT HEART CATHETERIZATION WITH CORONARY ANGIOGRAM N/A 07/23/2012   Procedure: LEFT HEART CATHETERIZATION WITH CORONARY ANGIOGRAM;  Surgeon: Leonie Man, MD;  Location: [facility] CATH [facility];  Service: Cardiovascular;  Laterality: N/A;  Carlton Adam MYOVIEW  11/14/2011   Mild-moderate defect seen in Mid Inferolateral and Mid Anterolateral regions-consistant w/ infarct/scar. No significant ischemia demonstrated.  [name] PACEMAKER INSERTION  2001   BS BivICD  . TEE WITHOUT CARDIOVERSION N/A 11/29/2014   Procedure: TRANSESOPHAGEAL ECHOCARDIOGRAM (TEE);  Surgeon: Melrose Nakayama, MD;  Location: [facility];  Service: Open Heart Surgery;  Laterality: N/A;  . TRANSTHORACIC ECHOCARDIOGRAM  07/23/2012   EF 55-60%, normal-mild  . TUBAL LIGATION      Current Medications: Outpatient Medications Prior to Visit  Medication Sig  Dispense Refill  . albuterol (PROVENTIL HFA;VENTOLIN HFA) 108 (90 BASE) MCG/ACT inhaler Inhale 2 puffs into the lungs every 4 (four) hours as needed for wheezing (wheezing).     [name] aspirin EC 325 MG EC tablet Take 1 tablet (325 mg total) by mouth daily. 30 tablet 0  . carvedilol (COREG) 6.25 MG tablet TAKE ONE TABLET BY MOUTH TWICE DAILY WITH A MEAL 60 tablet 0  . cetirizine (ZYRTEC) 10 MG tablet Take 10 mg by mouth daily.     . clotrimazole (MYCELEX) 10 MG troche Take 10 mg by mouth every 4 (four) hours as needed (mouth pain).    . fluticasone (FLONASE) 50 MCG/ACT nasal spray Place 1 spray into the nose 2 (two) times daily as needed for rhinitis or allergies.     [name] HYDROcodone-acetaminophen (NORCO/VICODIN) 5-325 MG tablet Take 1-2 tablets by mouth every 4 (four) hours as needed for moderate pain or severe pain. 15 tablet 0  . KLOR-CON M20 20 MEQ tablet TAKE ONE TABLET BY MOUTH ONCE DAILY (Patient taking differently: TAKE 20 MEQ BY MOUTH ONCE DAILY) 60 tablet 4  . omeprazole (PRILOSEC) 40 MG capsule Take 1 capsule (40 mg total) by mouth 2 (two) times daily before a meal.  60 capsule 11  . pravastatin (PRAVACHOL) 80 MG tablet Take 1 tablet (80 mg total) by mouth every evening. 90 tablet 2  . spironolactone (ALDACTONE) 25 MG tablet Take 25 mg by mouth daily.    . SYMBICORT 160-4.5 MCG/ACT inhaler Inhale 2 puffs into the lungs 2 (two) times daily.    [name] torsemide (DEMADEX) 20 MG tablet Take 40 mg by mouth 2 (two) times daily.    [name] triamcinolone cream (KENALOG) 0.1 % Apply 1 application topically daily as needed (affected area).      No facility-administered medications prior to visit.      Allergies:   Ivp dye [iodinated diagnostic agents]; Shellfish-derived products; Sulfa antibiotics; Iodine; Cephalexin; and Zithromax [azithromycin]   Social History   Social History  . Marital status: Widowed    Spouse name: N/A  . Number of children: 5  . Years of education: N/A   Occupational History  .  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 Smoking status: Former Smoker    Packs/day: 0.25    Years: 3.00    Types: Cigarettes    Quit date: 02/11/1968  . Smokeless tobacco: Never Used  . Alcohol use No  . Drug use: No  . Sexual activity: No   Other Topics Concern  . None   Social History Narrative   Widowed last year. She lives with her two sons.     Family History:  The patient's family history includes Asthma in her sister; Breast cancer in her mother; Diabetes in her maternal grandmother and mother; Glaucoma in her maternal aunt; Heart disease in her maternal aunt and maternal grandmother; Kidney disease in her maternal grandmother.   ROS:   Please see the history of present illness.    ROS All other systems reviewed and are negative.   PHYSICAL EXAM:   VS:  BP 132/74 (BP Location: Right Arm, Patient Position: Sitting, Cuff Size: Large)   Pulse 75   Ht 5\' 5"  (1.651 m)   Wt 243 lb 9.6 oz (110.5 kg)   SpO2 93%   BMI 40.54 kg/m    GEN: Morbid obesity limits her physical exam, well developed, in no acute distress  HEENT: normal  Neck: no JVD, carotid bruits, or masses Cardiac: RRR; no murmurs, rubs, or gallops,no edema  Respiratory:  clear to auscultation bilaterally, normal work of breathing GI: soft, nontender, nondistended, + BS MS: no deformity or atrophy  Skin: warm and dry, no rash Neuro:  Alert and Oriented x 3, Strength and sensation are intact Psych: euthymic mood, full affect  Wt Readings from Last 3 Encounters:  10/18/15 243 lb 9.6 oz (110.5 kg)  10/09/15 246 lb 12.8 oz (111.9 kg)  07/12/15 241 lb (109.3 kg)      Studies/Labs Reviewed:   EKG:  EKG is ordered today: atrial sensed, ventricular paced rhythm  Recent Labs: 12/28/2014: Magnesium 2.0 02/21/2015: TSH 3.677 05/02/2015: ALT 27 07/12/2015: BUN 25; Creatinine, Ser 1.14; Hemoglobin 11.5; Platelets 251.0; Potassium 4.4; Sodium 140 10/18/2015: Brain Natriuretic Peptide 55.7    Lipid Panel    Component Value Date/Time   CHOL 189 11/24/2014 0432   TRIG 104 11/24/2014 0432   HDL 51 11/24/2014 0432   CHOLHDL 3.7 11/24/2014 0432   VLDL 21 11/24/2014 0432   LDLCALC 117 (H) 11/24/2014 0432    Additional studies/ records that were reviewed today include:  Notes from heart failure clinic 8/29  ASSESSMENT:    1. Chronic diastolic heart failure (  [place])   2. CAD, multiple vessel   3. Complete heart block ([facility])   4. Cardiomyopathy, nonischemic, Bi V responder,  EF 65-70% echo Oct 2015   5. Cardiac resynchronization therapy pacemaker (CRT-P) in place   6. Dyspnea   7. Medication management      PLAN:  In order of problems listed above:  1. CHF: Volume status has always been challenging to assess due to her obesity. Her weight today is in the range that we previously had estimated to be her dry weight. Will check a BNP, and compare with previous levels. My impression is her dyspnea is not likely due to worsening heart failure. Is not unreasonable to try nitrates. 2. CAD s/p CABG without complaints of angina 3. CHB: Pacemaker dependent 4. CMP with normalization of systolic function following biventricular pacing 5. CRT-P with normal device function except for very high atrial pacing thresholds (she does not require atrial pacing). Device is programmed VDD. The current right atrial lead and right ventricular lead were placed in 2001. The current left ventricular lead was placed in 2008. 6. Etiology of her dyspnea is multifactorial, including morbid obesity and restrictive physiology, asthma, pulmonary hypertension due to obesity and obstructive sleep apnea as well as heart failure    Medication Adjustments/Labs and Tests Ordered: Current medicines are reviewed at length with the patient today.  Concerns regarding medicines are outlined above.  Medication changes, Labs and Tests ordered today are listed in the Patient Instructions below. Patient Instructions    Medication Instructions: Dr [name] has recommended making the following medication changes: 1. START Isosorbide 30 mg - take 1 tablet by mouth daily  Labwork: Your physician recommends that you return for lab work TODAY.  Testing/Procedures: NONE ORDERED  Follow-up: Dr [name] recommends that you schedule a follow-up appointment in 3 months.  If you need a refill on your cardiac medications before your next appointment, please call your pharmacy.    Signed, Sanda Klein, MD  10/20/2015 6:34 PM    [address], [place], [place]  [address] Phone: [phone]; Fax: [phone]

## 2015-10-19 ENCOUNTER — Other Ambulatory Visit (HOSPITAL_COMMUNITY): Payer: Self-pay | Admitting: Cardiology

## 2015-10-19 ENCOUNTER — Ambulatory Visit (HOSPITAL_COMMUNITY): Payer: Medicare Other

## 2015-10-19 ENCOUNTER — Other Ambulatory Visit: Payer: Self-pay | Admitting: *Deleted

## 2015-10-19 ENCOUNTER — Other Ambulatory Visit: Payer: Self-pay

## 2015-10-19 LAB — BRAIN NATRIURETIC PEPTIDE: Brain Natriuretic Peptide: 55.7 pg/mL (ref <100)

## 2015-10-19 MED ORDER — ISOSORBIDE MONONITRATE ER 30 MG PO TB24: 30.0000 mg | ORAL_TABLET | Freq: Every day | ORAL | 0 refills | Status: DC

## 2015-10-19 NOTE — Telephone Encounter (Signed)
Rx(s) sent to pharmacy electronically.  

## 2015-10-19 NOTE — Telephone Encounter (Signed)
Patient requested a local rx for the isosorbide so that she can go ahead and start taking it.

## 2015-10-20 ENCOUNTER — Other Ambulatory Visit: Payer: Self-pay | Admitting: Cardiovascular Disease

## 2015-10-22 ENCOUNTER — Encounter: Payer: Self-pay | Admitting: Family Medicine

## 2015-10-22 ENCOUNTER — Observation Stay (HOSPITAL_COMMUNITY)
Admission: EM | Admit: 2015-10-22 | Discharge: 2015-10-24 | Disposition: A | Discharge status: Home / Self Care | Payer: Commercial Managed Care - HMO | Attending: Internal Medicine | Admitting: Internal Medicine

## 2015-10-22 ENCOUNTER — Emergency Department (HOSPITAL_COMMUNITY): Payer: Commercial Managed Care - HMO

## 2015-10-22 ENCOUNTER — Encounter (HOSPITAL_COMMUNITY): Payer: Self-pay | Admitting: *Deleted

## 2015-10-22 DIAGNOSIS — E1122 Type 2 diabetes mellitus with diabetic chronic kidney disease: Secondary | ICD-10-CM | POA: Diagnosis not present

## 2015-10-22 DIAGNOSIS — Z79899 Other long term (current) drug therapy: Secondary | ICD-10-CM | POA: Insufficient documentation

## 2015-10-22 DIAGNOSIS — G4733 Obstructive sleep apnea (adult) (pediatric): Secondary | ICD-10-CM | POA: Diagnosis not present

## 2015-10-22 DIAGNOSIS — I130 Hypertensive heart and chronic kidney disease with heart failure and stage 1 through stage 4 chronic kidney disease, or unspecified chronic kidney disease: Secondary | ICD-10-CM | POA: Insufficient documentation

## 2015-10-22 DIAGNOSIS — G4730 Sleep apnea, unspecified: Secondary | ICD-10-CM | POA: Diagnosis present

## 2015-10-22 DIAGNOSIS — Z87891 Personal history of nicotine dependence: Secondary | ICD-10-CM | POA: Insufficient documentation

## 2015-10-22 DIAGNOSIS — R8271 Bacteriuria: Secondary | ICD-10-CM | POA: Insufficient documentation

## 2015-10-22 DIAGNOSIS — R0600 Dyspnea, unspecified: Secondary | ICD-10-CM | POA: Diagnosis present

## 2015-10-22 DIAGNOSIS — Z7951 Long term (current) use of inhaled steroids: Secondary | ICD-10-CM | POA: Diagnosis not present

## 2015-10-22 DIAGNOSIS — I5032 Chronic diastolic (congestive) heart failure: Secondary | ICD-10-CM | POA: Insufficient documentation

## 2015-10-22 DIAGNOSIS — R0602 Shortness of breath: Secondary | ICD-10-CM | POA: Diagnosis not present

## 2015-10-22 DIAGNOSIS — I639 Cerebral infarction, unspecified: Secondary | ICD-10-CM

## 2015-10-22 DIAGNOSIS — J4521 Mild intermittent asthma with (acute) exacerbation: Secondary | ICD-10-CM

## 2015-10-22 DIAGNOSIS — Z6841 Body Mass Index (BMI) 40.0 and over, adult: Secondary | ICD-10-CM | POA: Diagnosis not present

## 2015-10-22 DIAGNOSIS — G451 Carotid artery syndrome (hemispheric): Secondary | ICD-10-CM

## 2015-10-22 DIAGNOSIS — Z951 Presence of aortocoronary bypass graft: Secondary | ICD-10-CM | POA: Insufficient documentation

## 2015-10-22 DIAGNOSIS — G459 Transient cerebral ischemic attack, unspecified: Principal | ICD-10-CM | POA: Insufficient documentation

## 2015-10-22 DIAGNOSIS — N183 Chronic kidney disease, stage 3 (moderate): Secondary | ICD-10-CM | POA: Diagnosis not present

## 2015-10-22 DIAGNOSIS — R079 Chest pain, unspecified: Secondary | ICD-10-CM | POA: Diagnosis not present

## 2015-10-22 DIAGNOSIS — Z9861 Coronary angioplasty status: Secondary | ICD-10-CM

## 2015-10-22 DIAGNOSIS — I2510 Atherosclerotic heart disease of native coronary artery without angina pectoris: Secondary | ICD-10-CM | POA: Diagnosis not present

## 2015-10-22 DIAGNOSIS — R202 Paresthesia of skin: Secondary | ICD-10-CM | POA: Diagnosis not present

## 2015-10-22 DIAGNOSIS — E782 Mixed hyperlipidemia: Secondary | ICD-10-CM | POA: Diagnosis not present

## 2015-10-22 DIAGNOSIS — Z8673 Personal history of transient ischemic attack (TIA), and cerebral infarction without residual deficits: Secondary | ICD-10-CM | POA: Insufficient documentation

## 2015-10-22 DIAGNOSIS — I442 Atrioventricular block, complete: Secondary | ICD-10-CM | POA: Insufficient documentation

## 2015-10-22 DIAGNOSIS — I428 Other cardiomyopathies: Secondary | ICD-10-CM | POA: Insufficient documentation

## 2015-10-22 DIAGNOSIS — Z7982 Long term (current) use of aspirin: Secondary | ICD-10-CM | POA: Insufficient documentation

## 2015-10-22 DIAGNOSIS — I4891 Unspecified atrial fibrillation: Secondary | ICD-10-CM | POA: Diagnosis present

## 2015-10-22 DIAGNOSIS — Z95810 Presence of automatic (implantable) cardiac defibrillator: Secondary | ICD-10-CM | POA: Insufficient documentation

## 2015-10-22 DIAGNOSIS — J45909 Unspecified asthma, uncomplicated: Secondary | ICD-10-CM | POA: Diagnosis present

## 2015-10-22 DIAGNOSIS — J45901 Unspecified asthma with (acute) exacerbation: Secondary | ICD-10-CM | POA: Insufficient documentation

## 2015-10-22 DIAGNOSIS — I10 Essential (primary) hypertension: Secondary | ICD-10-CM | POA: Diagnosis present

## 2015-10-22 DIAGNOSIS — I480 Paroxysmal atrial fibrillation: Secondary | ICD-10-CM | POA: Diagnosis present

## 2015-10-22 DIAGNOSIS — K219 Gastro-esophageal reflux disease without esophagitis: Secondary | ICD-10-CM | POA: Insufficient documentation

## 2015-10-22 LAB — COMPREHENSIVE METABOLIC PANEL
ALT: 22 U/L (ref 14–54)
AST: 17 U/L (ref 15–41)
Albumin: 3.5 g/dL (ref 3.5–5.0)
Alkaline Phosphatase: 97 U/L (ref 38–126)
Anion gap: 8 (ref 5–15)
BUN: 27 mg/dL — ABNORMAL HIGH (ref 6–20)
CO2: 28 mmol/L (ref 22–32)
Calcium: 9.2 mg/dL (ref 8.9–10.3)
Chloride: 104 mmol/L (ref 101–111)
Creatinine, Ser: 1.18 mg/dL — ABNORMAL HIGH (ref 0.44–1.00)
GFR calc Af Amer: 52 mL/min — ABNORMAL LOW (ref >60)
GFR calc non Af Amer: 45 mL/min — ABNORMAL LOW (ref >60)
Glucose, Bld: 117 mg/dL — ABNORMAL HIGH (ref 65–99)
Potassium: 4.3 mmol/L (ref 3.5–5.1)
Sodium: 140 mmol/L (ref 135–145)
Total Bilirubin: 0.4 mg/dL (ref 0.3–1.2)
Total Protein: 7.1 g/dL (ref 6.5–8.1)

## 2015-10-22 LAB — CBC WITH DIFFERENTIAL/PLATELET
Basophils Absolute: 0 10*3/uL (ref 0.0–0.1)
Basophils Relative: 1 %
Eosinophils Absolute: 0.2 10*3/uL (ref 0.0–0.7)
Eosinophils Relative: 2 %
HCT: 43.7 % (ref 36.0–46.0)
Hemoglobin: 13.2 g/dL (ref 12.0–15.0)
Lymphocytes Relative: 23 %
Lymphs Abs: 1.7 10*3/uL (ref 0.7–4.0)
MCH: 26.8 pg (ref 26.0–34.0)
MCHC: 30.2 g/dL (ref 30.0–36.0)
MCV: 88.6 fL (ref 78.0–100.0)
Monocytes Absolute: 0.4 10*3/uL (ref 0.1–1.0)
Monocytes Relative: 5 %
Neutro Abs: 5.3 10*3/uL (ref 1.7–7.7)
Neutrophils Relative %: 69 %
Platelets: 156 10*3/uL (ref 150–400)
RBC: 4.93 MIL/uL (ref 3.87–5.11)
RDW: 15.1 % (ref 11.5–15.5)
WBC: 7.6 10*3/uL (ref 4.0–10.5)

## 2015-10-22 MED ORDER — ACETAMINOPHEN 325 MG PO TABS
650.0000 mg | ORAL_TABLET | Freq: Once | ORAL | Status: AC
  Administered 2015-10-22: 650 mg via ORAL
  Filled 2015-10-22: qty 2

## 2015-10-22 NOTE — Telephone Encounter (Signed)
REFILL 

## 2015-10-22 NOTE — ED Provider Notes (Signed)
[facility] DEPT Provider Note   CSN: [id number] Arrival date & time: 10/22/15  1633     History   Chief Complaint Chief Complaint  Patient presents with  . Numbness    HPI [name] is a 70 y.o. female.  HPI   Patient is a 70 year old female with a history of CAD S/P CABG, GERD, obesity with pacemaker in place, who presents to the emergency department with sudden onset left arm numbness, headache and neck pain since 11 AM this morning. Headache and neck pain is pounding, 8/10, nonradiating. Associated leg fatigue, left hand pain and weakness. Pt states she was unable to use her left arm for roughly 30 minutes. Patient denies nausea, vomiting, visual changes, dizziness, abdominal pain, new onset shortness of breath or new onset chest pain.  Past Medical History:  Diagnosis Date  . Anemia   . Arthritis   . Asthma   . CAD (coronary artery disease)   . Cellulitis and abscess of foot 12/2014   RT FOOT  . Diabetes mellitus without complication ([place])   . Diverticulosis   . Fatty liver disease, nonalcoholic   . Gastritis   . GERD (gastroesophageal reflux disease)   . H/O hiatal hernia   . Hyperplastic colon polyp   . Hypertension   . IBS (irritable bowel syndrome)   . ICD (implantable cardiac defibrillator) in place   . Internal hemorrhoids   . Obesity   . OSA (obstructive sleep apnea)   . Shortness of breath     Patient Active Problem List   Diagnosis Date Noted  . Chest pain 10/23/2015  . TIA (transient ischemic attack) 10/22/2015  . Complete heart block ([place]) 06/22/2015  . Allergic drug rash due to anti-infective agent 04/29/2015  . Fatigue 02/14/2015  . Chronic diastolic heart failure ([place]) 02/14/2015  . Cellulitis 12/21/2014  . S/P CABG x 5 11/29/2014  . CAD, multiple vessel   . Acute diastolic HF (heart failure) ([place]) 11/23/2014  . Diarrhea 07/12/2014  . Generalized abdominal pain 07/12/2014  . Diastolic CHF, acute on chronic (HCC) 11/04/2013  .  Mixed hypercholesterolemia and hypertriglyceridemia 04/22/2013  . Vertigo 04/22/2013  . Dyspnea 08/25/2012  . Allergic to IV contrast 07/23/2012  . Unstable angina ([place]) 07/22/2012  . Morbid obesity ([place]) 11/22/2011  . Cardiomyopathy, nonischemic, Bi V responder,  EF 65-70% echo Oct 2015 11/22/2011  . GERD (gastroesophageal reflux disease) 11/22/2011  . Back pain 11/22/2011  . OSA (obstructive sleep apnea) 11/22/2011  . HEMORRHOIDS-EXTERNAL 02/21/2010  . NAUSEA 02/21/2010  . ABDOMINAL PAIN -GENERALIZED 02/21/2010  . PERSONAL HX COLONIC POLYPS 02/21/2010  . ANEMIA 04/16/2007  . Essential hypertension 04/16/2007  . Coronary atherosclerosis 04/16/2007  . DIVERTICULOSIS, COLON 04/16/2007  . ARTHRITIS 04/16/2007  . INTERNAL HEMORRHOIDS WITHOUT MENTION COMP 04/12/2007  . Asthma 04/12/2007  . Cardiac resynchronization therapy pacemaker (CRT-P) in place 04/12/2007  . ACUTE GASTRITIS WITHOUT MENTION OF HEMORRHAGE 12/14/2006  . COLONIC POLYPS, HYPERPLASTIC 09/27/2002  . FATTY LIVER DISEASE 02/16/2002    Past Surgical History:  Procedure Laterality Date  . ABDOMINAL ULTRASOUND  12/01/2011   Peripelvic cysts- #1- 2.4x1.9x2.3cm, #2-1.2x0.9x1.2cm  . ANKLE SURGERY     right  . APPENDECTOMY    . BIV PACEMAKER GENERATOR CHANGE OUT N/A 11/02/2012   Procedure: BIV PACEMAKER GENERATOR CHANGE OUT;  Surgeon: Sanda Klein, MD;  Location: [facility] CATH [facility];  Service: Cardiovascular;  Laterality: N/A;  . CARDIAC CATHETERIZATION  05/17/1999   No significant coronary obstructive disease w/ mild 20% luminal irregularity  of the first diag branch of the LAD  . CARDIAC CATHETERIZATION  07/08/2002   No significant CAD, moderately depressed LV systolic function  . CARDIAC CATHETERIZATION Bilateral 04/26/2007   Normal findings, recommend medical therapy  . CARDIAC CATHETERIZATION  02/18/2008   Moderate CAD, would benefit from having a functional study, recommend continue medical therapy  . CARDIAC CATHETERIZATION   07/23/2012   Medical therapy  . CARDIAC CATHETERIZATION N/A 11/24/2014   Procedure: Left Heart Cath and Coronary Angiography;  Surgeon: Troy Sine, MD;  Location: [facility];  Service: Cardiovascular;  Laterality: N/A;  . CARDIAC CATHETERIZATION  11/27/2014   Procedure: Intravascular Pressure Wire/FFR Study;  Surgeon: Peter M Martinique, MD;  Location: [facility];  Service: Cardiovascular;;  . CARDIAC DEFIBRILLATOR PLACEMENT    . CERVICAL SPINE SURGERY     plate   . CORONARY ANGIOGRAM  2010  . CORONARY ARTERY BYPASS GRAFT N/A 11/29/2014   Procedure: CORONARY ARTERY BYPASS GRAFTING x 5 (LIMA-LAD, SVG-D, SVG-OM1-OM2, SVG-PD);  Surgeon: Melrose Nakayama, MD;  Location: [facility];  Service: Open Heart Surgery;  Laterality: N/A;  . KNEE SURGERY     right  . LEFT HEART CATHETERIZATION WITH CORONARY ANGIOGRAM N/A 07/23/2012   Procedure: LEFT HEART CATHETERIZATION WITH CORONARY ANGIOGRAM;  Surgeon: Leonie Man, MD;  Location: [facility] C[facility];  Service: Cardiovascular;  Laterality: N/A;  Carlton Adam MYOVIEW  11/14/2011   Mild-moderate defect seen in Mid Inferolateral and Mid Anterolateral regions-consistant w/ infarct/scar. No significant ischemia demonstrated.  [name] PACEMAKER INSERTION  2001   BS BivICD  . TEE WITHOUT CARDIOVERSION N/A 11/29/2014   Procedure: TRANSESOPHAGEAL ECHOCARDIOGRAM (TEE);  Surgeon: Melrose Nakayama, MD;  Location: [facility];  Service: Open Heart Surgery;  Laterality: N/A;  . TRANSTHORACIC ECHOCARDIOGRAM  07/23/2012   EF 55-60%, normal-mild  . TUBAL LIGATION      OB History    No data available       Home Medications    Prior to Admission medications   Medication Sig Start Date End Date Taking? Authorizing Provider  acetaminophen (TYLENOL) 500 MG tablet Take 1,000 mg by mouth every 6 (six) hours as needed (for pain.).   Yes Historical Provider, MD  albuterol (PROVENTIL HFA;VENTOLIN HFA) 108 (90 BASE) MCG/ACT inhaler Inhale 2 puffs into the lungs every 4  (four) hours as needed for wheezing (wheezing).    Yes Historical Provider, MD  aspirin EC 325 MG EC tablet Take 1 tablet (325 mg total) by mouth daily. 12/07/14  Yes Gina L Collins, PA-C  carvedilol (COREG) 6.25 MG tablet TAKE ONE TABLET BY MOUTH TWICE DAILY WITH  A  MEAL 10/22/15  Yes [name], MD  cetirizine (ZYRTEC) 10 MG tablet Take 10 mg by mouth daily.    Yes Historical Provider, MD  clotrimazole (MYCELEX) 10 MG troche Take 10 mg by mouth every 4 (four) hours as needed (mouth pain).   Yes Historical Provider, MD  EPINEPHrine 0.3 mg/0.3 mL IJ SOAJ injection  10/17/15  Yes Historical Provider, MD  fluorometholone (FML) 0.1 % ophthalmic suspension Place 1 drop into both eyes 2 (two) times daily. 08/22/15  Yes Historical Provider, MD  fluticasone (FLONASE) 50 MCG/ACT nasal spray Place 1 spray into the nose 2 (two) times daily as needed for rhinitis or allergies.    Yes Historical Provider, MD  isosorbide mononitrate (IMDUR) 30 MG 24 hr tablet Take 1 tablet (30 mg total) by mouth daily. 10/19/15 10/13/16 Yes Sanda Klein, MD  Rebeca Allegra  M20 20 MEQ tablet TAKE ONE TABLET BY MOUTH ONCE DAILY Patient taking differently: TAKE 20 MEQ BY MOUTH ONCE DAILY 04/05/15  Yes [name], MD  omeprazole (PRILOSEC) 40 MG capsule Take 1 capsule (40 mg total) by mouth 2 (two) times daily before a meal. 07/12/15  Yes [name], MD  pravastatin (PRAVACHOL) 80 MG tablet Take 1 tablet (80 mg total) by mouth every evening. 10/31/14  Yes [name], MD  spironolactone (ALDACTONE) 25 MG tablet Take 25 mg by mouth daily. 11/10/14  Yes Historical Provider, MD  SYMBICORT 160-4.5 MCG/ACT inhaler Inhale 2 puffs into the lungs 2 (two) times daily. 12/02/13  Yes Historical Provider, MD  torsemide (DEMADEX) 20 MG tablet Take 40 mg by mouth 2 (two) times daily.   Yes Historical Provider, MD  triamcinolone cream (KENALOG) 0.1 % Apply 1 application topically daily as needed (applied to affected itchy area(s)).  08/24/14  Yes  Historical Provider, MD  spironolactone (ALDACTONE) 25 MG tablet TAKE ONE-HALF TABLET BY MOUTH TWICE DAILY Patient not taking: Reported on 10/23/2015 10/22/15   Sanda Klein, MD    Family History Family History  Problem Relation Age of Onset  . Breast cancer Mother   . Diabetes Mother   . Heart disease Maternal Grandmother   . Kidney disease Maternal Grandmother   . Diabetes Maternal Grandmother   . Glaucoma Maternal Aunt   . Heart disease Maternal Aunt   . Asthma Sister     Social History Social History  Substance Use Topics  . Smoking status: Former Smoker    Packs/day: 0.25    Years: 3.00    Types: Cigarettes    Quit date: 02/11/1968  . Smokeless tobacco: Never Used  . Alcohol use No     Allergies   Ivp dye [iodinated diagnostic agents]; Shellfish-derived products; Sulfa antibiotics; Iodine; Cephalexin; and Zithromax [azithromycin]   Review of Systems Review of Systems  Constitutional: Negative for chills and fever.  HENT: Negative for sore throat.   Eyes: Negative for photophobia and visual disturbance.  Respiratory: Negative for chest tightness and shortness of breath.   Cardiovascular: Negative for chest pain.  Gastrointestinal: Negative for abdominal pain, diarrhea, nausea and vomiting.  Genitourinary: Negative for dysuria and hematuria.  Musculoskeletal: Positive for neck pain. Negative for neck stiffness.  Skin: Negative for rash.  Neurological: Positive for weakness and headaches. Negative for dizziness, syncope and numbness.     Physical Exam Updated Vital Signs BP 126/75 (BP Location: Right Arm)   Pulse 73   Temp 98 F (36.7 C) (Oral)   Resp 18   SpO2 91%   Physical Exam  Constitutional: Morbidly obese, Pt is oriented to person, place, and time. Pt appears well-developed and well-nourished. No distress.  HENT:  Head: Normocephalic and atraumatic.  Mouth/Throat: Oropharynx is clear and moist.  Eyes: Conjunctivae and EOM are normal. Pupils are  equal, round, and reactive to light. No scleral icterus.  No horizontal, vertical or rotational nystagmus  Neck: Normal range of motion. Neck supple with full ROM. Pain with movement No nuchal rigidity or meningeal signs, TTP of the paraspinal muscles and cervical spine of C6-C7.  Cardiovascular: Normal rate, irregular rhythm and intact distal pulses including radial and DP 2+ bilaterally.   Pulmonary/Chest: Effort normal  No respiratory distress, CTA no wheezes, rales or rhonchi.  Abdominal: Soft. There is no tenderness. There is no rebound and no guarding, normal bowel sounds.  Musculoskeletal: 1+ pitting edema noted to bilateral lower extremities.  Neurological:  Pt. is alert and oriented to person, place, and time. No cranial nerve deficit.  Exhibits normal muscle tone. Coordination normal.  Mental Status:  Alert, oriented, thought content appropriate. Speech fluent without evidence of aphasia. Able to follow 2 step commands without difficulty.  Cranial Nerves:  II:  Peripheral visual fields grossly normal, pupils equal, round, reactive to light III,IV, VI: ptosis not present, extra-ocular motions intact bilaterally  V,VII: smile symmetric, facial light touch sensation equal VIII: hearing grossly normal bilaterally  IX,X: midline uvula rise  XI: bilateral shoulder shrug equal and strong XII: midline tongue extension  Motor:  5/5 in upper and lower extremities bilaterally including strong and equal grip strength and dorsiflexion/plantar flexion Sensory: light touch normal in all extremities.  Cerebellar: normal finger-to-nose with bilateral upper extremities, pronator drift negative Gait: not assessed  Skin: Skin is warm and dry. No rash noted. Pt is not diaphoretic.  Psychiatric: Pt has a normal mood and affect. Behavior is normal. Judgment and thought content normal.  Nursing note and vitals reviewed.   ED Treatments / Results  Labs (all labs ordered are listed, but only abnormal  results are displayed) Labs Reviewed  COMPREHENSIVE METABOLIC PANEL - Abnormal; Notable for the following:       Result Value   Glucose, Bld 117 (*)    BUN 27 (*)    Creatinine, Ser 1.18 (*)    GFR calc non Af Amer 45 (*)    GFR calc Af Amer 52 (*)    All other components within normal limits  URINALYSIS, ROUTINE W REFLEX MICROSCOPIC (NOT AT [facility]) - Abnormal; Notable for the following:    APPearance CLOUDY (*)    Leukocytes, UA SMALL (*)    All other components within normal limits  D-DIMER, QUANTITATIVE (NOT AT [facility]) - Abnormal; Notable for the following:    D-Dimer, Quant 1.56 (*)    All other components within normal limits  URINE MICROSCOPIC-ADD ON - Abnormal; Notable for the following:    Squamous Epithelial / LPF 0-5 (*)    Bacteria, UA FEW (*)    All other components within normal limits  CBC WITH DIFFERENTIAL/PLATELET    EKG  EKG Interpretation  Date/Time:  Monday October 22 2015 20:24:20 EDT Ventricular Rate:  67 PR Interval:    QRS Duration: 156 QT Interval:  472 QTC Calculation: 499 R Axis:   0 Text Interpretation:  Atrial-sensed ventricular-paced rhythm No further analysis attempted due to paced rhythm No significant change since last tracing Confirmed by ALLEN  MD, ANTHONY ([id number]) on 10/22/2015 10:15:10 PM       Radiology Dg Chest 2 View  Result Date: 10/23/2015 CLINICAL DATA:  Midchest pain for 1 week.  Shortness of breath. EXAM: CHEST  2 VIEW COMPARISON:  Chest radiograph 06/20/2015 FINDINGS: Right chest wall AICD leads are unchanged in position. Cardiomediastinal contours are unchanged. No focal airspace consolidation or pulmonary edema. No pneumothorax or sizable pleural effusion. CABG markers and median sternotomy wires are unchanged. IMPRESSION: No active cardiopulmonary disease.  Aortic atherosclerosis. Electronically Signed   By: Ulyses Jarred M.D.   On: 10/23/2015 00:58   Ct Head Wo Contrast  Result Date: 10/22/2015 CLINICAL DATA:  Left arm and leg  paresthesias, onset at 11:00. Partial resolution since that time. EXAM: CT HEAD WITHOUT CONTRAST CT CERVICAL SPINE WITHOUT CONTRAST TECHNIQUE: Multidetector CT imaging of the head and cervical spine was performed following the standard protocol without intravenous contrast. Multiplanar CT image reconstructions of the cervical spine were also  generated. COMPARISON:  None. FINDINGS: CT HEAD FINDINGS Brain: There is no intracranial hemorrhage, mass or evidence of acute infarction. Remote lacunar infarction in the left caudate. There is mild generalized atrophy. There is mild chronic microvascular ischemic change. There is no significant extra-axial fluid collection. No acute intracranial findings are evident. Vascular: No hyperdense vessel or unexpected calcification. Skull: Normal. Negative for fracture or focal lesion. Sinuses/Orbits: No acute finding. CT CERVICAL SPINE FINDINGS Alignment: Mild straightening of cervical lordosis. Prior anterior interbody fusion with plate screw fixation hardware at C4-5. Skull base and vertebrae: No acute fracture. No primary bone lesion or focal pathologic process. Soft tissues and spinal canal: No prevertebral fluid or swelling. No visible canal hematoma. Disc levels: Moderate degenerative disc changes from C4 through C7. Prominent posterior osteophytes at C5-6 and C6-7 likely cause mild degenerative central canal stenosis. Upper chest: No significant abnormality Other: IMPRESSION: 1. No acute intracranial findings. There is mild generalized atrophy and chronic appearing white matter hypodensities which likely represent small vessel ischemic disease. Remote left caudate lacunar infarction. 2. No acute cervical spine findings. Prior C4-5 anterior interbody fusion. Moderate degenerative cervical disc disease from C4 through C7. Electronically Signed   By: Andreas Newport M.D.   On: 10/22/2015 22:49   Ct Cervical Spine Wo Contrast  Result Date: 10/22/2015 CLINICAL DATA:  Left  arm and leg paresthesias, onset at 11:00. Partial resolution since that time. EXAM: CT HEAD WITHOUT CONTRAST CT CERVICAL SPINE WITHOUT CONTRAST TECHNIQUE: Multidetector CT imaging of the head and cervical spine was performed following the standard protocol without intravenous contrast. Multiplanar CT image reconstructions of the cervical spine were also generated. COMPARISON:  None. FINDINGS: CT HEAD FINDINGS Brain: There is no intracranial hemorrhage, mass or evidence of acute infarction. Remote lacunar infarction in the left caudate. There is mild generalized atrophy. There is mild chronic microvascular ischemic change. There is no significant extra-axial fluid collection. No acute intracranial findings are evident. Vascular: No hyperdense vessel or unexpected calcification. Skull: Normal. Negative for fracture or focal lesion. Sinuses/Orbits: No acute finding. CT CERVICAL SPINE FINDINGS Alignment: Mild straightening of cervical lordosis. Prior anterior interbody fusion with plate screw fixation hardware at C4-5. Skull base and vertebrae: No acute fracture. No primary bone lesion or focal pathologic process. Soft tissues and spinal canal: No prevertebral fluid or swelling. No visible canal hematoma. Disc levels: Moderate degenerative disc changes from C4 through C7. Prominent posterior osteophytes at C5-6 and C6-7 likely cause mild degenerative central canal stenosis. Upper chest: No significant abnormality Other: IMPRESSION: 1. No acute intracranial findings. There is mild generalized atrophy and chronic appearing white matter hypodensities which likely represent small vessel ischemic disease. Remote left caudate lacunar infarction. 2. No acute cervical spine findings. Prior C4-5 anterior interbody fusion. Moderate degenerative cervical disc disease from C4 through C7. Electronically Signed   By: Andreas Newport M.D.   On: 10/22/2015 22:49    Procedures Procedures (including critical care  time)  Medications Ordered in ED Medications  acetaminophen (TYLENOL) tablet 650 mg (650 mg Oral Given 10/22/15 2320)     Initial Impression / Assessment and Plan / ED Course  I have reviewed the triage vital signs and the nursing notes.  Pertinent labs & imaging results that were available during my care of the patient were reviewed by me and considered in my medical decision making (see chart for details).  Clinical Course   Pt with left arm weakness and numbness. Concern for TIA. CT head did not reveal any acute  abnormalities. Due to pt's Comorbidities and symptoms admission for TIA workup is reasonable at this time.  Patient case discussed and patient seen by Dr. Zenia Resides who agrees with the above plan.  Talked with Dr. Roel Cluck who agrees to admit the pt. I also spoke with Dr, Nicole Kindred, neurology, who would like the pt be transferred to [facility] and he will see her there.   Thank you Dr. Roel Cluck and Dr. Nicole Kindred for your time and care of this pt.   Final Clinical Impressions(s) / ED Diagnoses   Final diagnoses:  Dyspnea    New Prescriptions New Prescriptions   No medications on file     Kalman Drape, [place] 10/23/15 0126    Lacretia Leigh, MD 10/27/15 0800

## 2015-10-22 NOTE — ED Triage Notes (Addendum)
Pt complains of left arm and leg numbness, headache, difficulty with gait since 11 this morning. Pt states numbness in her leg and arm has resolved but still feels decreased sensation and weakness in her left arm. Pt states she still has difficulty walking. Pt denies slurred speech, facial droop

## 2015-10-22 NOTE — ED Provider Notes (Signed)
Medical screening examination/treatment/procedure(s) were conducted as a shared visit with non-physician practitioner(s) and myself.  I personally evaluated the patient during the encounter.   EKG Interpretation  Date/Time:  Monday October 22 2015 20:24:20 EDT Ventricular Rate:  67 PR Interval:    QRS Duration: 156 QT Interval:  472 QTC Calculation: 499 R Axis:   0 Text Interpretation:  Atrial-sensed ventricular-paced rhythm No further analysis attempted due to paced rhythm No significant change since last tracing Confirmed by Cherise Fedder  MD, Zayden Hahne ([id number]) on 10/22/2015 10:15:43 PM     70 year old female presents with 30 minute episode of left upper extremity weakness to the point where she could not grasp a cup. Denies any associated new lower extremity weakness but states that she has been weak in both legs over a week. Denies any headache. No visual changes. No change in her speech. No trouble swallowing. Head CT is reassuring. Patient symptoms suspicious for TIA. She does have a pacemaker and cannot have an MRI. We'll admit to medicine   Lacretia Leigh, MD 10/22/15 [phone]

## 2015-10-22 NOTE — H&P (Signed)
[name] ZDG:[id number] DOB: [birth year] DOA: 10/22/2015     PCP: [name], MD   Outpatient Specialists: Cardiology Croitoru, Gi nadigam  Patient coming from:    home Lives   With family    Chief Complaint: Left arm and leg numbness  HPI: [name] is a 70 y.o. female with medical history significant of CAD s/p CABG October 2016 (LIMA to LAD, SVG to D1, SVG to OM1 and OM2, SVG to PDA), diastolic heart failure, morbid obesity, adrenal insufficiency, type 2 diabetes mellitus, Hypertension, stage III chronic kidney disease, asthma, obstructive sleep apnea. Nonischemic cardiomyopathy complete heart bark status post biventricular ICD in 2008  Presented with sudden in onset moderate left arm and leg numbness since 11 AM associated with headache and difficulty with gait. The numbness left leg has resolved but patient had persistent numbness in left arm and still trouble walking which point she presented to [facility] long emergency department. This was not associated with slurred speech or facial droop. She reports associated neck pain. Headache was nonradiating. Denies any nausea vomiting no lightheadedness no vertigo no abdominal pain   Patient has no prior history of TIA CVAs in the past she does have known history of coronary artery disease.   Patient reports having occasional chest pains on left breast worse with exertion today she felt severe dyspnea with walking to the bathroom. She reports increased wheezing.  She reports that her asthma has been "acting up " she reacts to cooler weather.  Patient have seen her cardiologist for the chest pain she was started on Imdur with some improvement. Denies leg swelling.    Regarding pertinent Chronic problems: Patient has been followed by cardiology she has Nonischemic cardiomyopathy and heart failure preceded the diagnosis of CAD. She has complete heart block and had a  pacemaker implanted in 2001 later upgraded to a biventricular  ICD  in 2008 for severe cardiomyopathy with ejection fraction 20%, downgraded to CRT-P in 2014 ([place]) following excellent response to resynchronization  normalization of left ventricular systolic function  Most recent echo shows ejection fraction normal at 55-60 percent with grade 1 diastolic dysfunction dry weight was estimated at around 246 pounds.  Patient have had recurrent admissions for cellulitis as well as volume overload  IN ER:  Temp (24hrs), Avg:98 F (36.7 C), Min:98 F (36.7 C), Max:98 F (36.7 C)      Respirations 24 heart rate 84 blood pressure 158/88 WBC 7.6 hemoglobin 13.2 Sodium 140 BUN 27 potassium 4.3 cranial 1.18 which is around baseline Weight 243 pounds   CT head nonacute and showed remote left caudate lacunar infarction Following Medications were ordered in ER: Medications  acetaminophen (TYLENOL) tablet 650 mg (650 mg Oral Given 10/22/15 2320)     ER provider discussed case with:  Neurology  Dr Nicole Kindred  Hospitalist was called for admission for TIA  Review of Systems:    Pertinent positives include:  localizing neurological complaints tingling,   weakness   Constitutional:  No weight loss, night sweats, Fevers, chills, fatigue, weight loss  HEENT:  No headaches, Difficulty swallowing,Tooth/dental problems,Sore throat,  No sneezing, itching, ear ache, nasal congestion, post nasal drip,  Cardio-vascular:  No chest pain, Orthopnea, PND, anasarca, dizziness, palpitations.no Bilateral lower extremity swelling  GI:  No heartburn, indigestion, abdominal pain, nausea, vomiting, diarrhea, change in bowel habits, loss of appetite, melena, blood in stool, hematemesis Resp:  no shortness of breath at rest. No dyspnea on exertion, No excess mucus, no  productive cough, No non-productive cough, No coughing up of blood.No change in color of mucus.No wheezing. Skin:  no rash or lesions. No jaundice GU:  no dysuria, change in color of urine, no urgency or  frequency. No straining to urinate.  No flank pain.  Musculoskeletal:  No joint pain or no joint swelling. No decreased range of motion. No back pain.  Psych:  No change in mood or affect. No depression or anxiety. No memory loss.  Neuro: no, no, no double vision, no gait abnormality, no slurred speech, no confusion  As per HPI otherwise 10 point review of systems negative.   Past Medical History: Past Medical History:  Diagnosis Date  . Anemia   . Arthritis   . Asthma   . CAD (coronary artery disease)   . Cellulitis and abscess of foot 12/2014   RT FOOT  . Diabetes mellitus without complication ([place])   . Diverticulosis   . Fatty liver disease, nonalcoholic   . Gastritis   . GERD (gastroesophageal reflux disease)   . H/O hiatal hernia   . Hyperplastic colon polyp   . Hypertension   . IBS (irritable bowel syndrome)   . ICD (implantable cardiac defibrillator) in place   . Internal hemorrhoids   . Obesity   . OSA (obstructive sleep apnea)   . Shortness of breath    Past Surgical History:  Procedure Laterality Date  . ABDOMINAL ULTRASOUND  12/01/2011   Peripelvic cysts- #1- 2.4x1.9x2.3cm, #2-1.2x0.9x1.2cm  . ANKLE SURGERY     right  . APPENDECTOMY    . BIV PACEMAKER GENERATOR CHANGE OUT N/A 11/02/2012   Procedure: BIV PACEMAKER GENERATOR CHANGE OUT;  Surgeon: Sanda Klein, MD;  Location: [place] CATH LAB;  Service: Cardiovascular;  Laterality: N/A;  . CARDIAC CATHETERIZATION  05/17/1999   No significant coronary obstructive disease w/ mild 20% luminal irregularity of the first diag branch of the LAD  . CARDIAC CATHETERIZATION  07/08/2002   No significant CAD, moderately depressed LV systolic function  . CARDIAC CATHETERIZATION Bilateral 04/26/2007   Normal findings, recommend medical therapy  . CARDIAC CATHETERIZATION  02/18/2008   Moderate CAD, would benefit from having a functional study, recommend continue medical therapy  . CARDIAC CATHETERIZATION  07/23/2012   Medical  therapy  . CARDIAC CATHETERIZATION N/A 11/24/2014   Procedure: Left Heart Cath and Coronary Angiography;  Surgeon: Troy Sine, MD;  Location: [place] CV LAB;  Service: Cardiovascular;  Laterality: N/A;  . CARDIAC CATHETERIZATION  11/27/2014   Procedure: Intravascular Pressure Wire/FFR Study;  Surgeon: Peter M Martinique, MD;  Location: [place] CV LAB;  Service: Cardiovascular;;  . CARDIAC DEFIBRILLATOR PLACEMENT    . CERVICAL SPINE SURGERY     plate   . CORONARY ANGIOGRAM  2010  . CORONARY ARTERY BYPASS GRAFT N/A 11/29/2014   Procedure: CORONARY ARTERY BYPASS GRAFTING x 5 (LIMA-LAD, SVG-D, SVG-OM1-OM2, SVG-PD);  Surgeon: Melrose Nakayama, MD;  Location: [place];  Service: Open Heart Surgery;  Laterality: N/A;  . KNEE SURGERY     right  . LEFT HEART CATHETERIZATION WITH CORONARY ANGIOGRAM N/A 07/23/2012   Procedure: LEFT HEART CATHETERIZATION WITH CORONARY ANGIOGRAM;  Surgeon: Leonie Man, MD;  Location: [facility] LAB;  Service: Cardiovascular;  Laterality: N/A;  Carlton Adam MYOVIEW  11/14/2011   Mild-moderate defect seen in Mid Inferolateral and Mid Anterolateral regions-consistant w/ infarct/scar. No significant ischemia demonstrated.  [name] PACEMAKER INSERTION  2001   BS BivICD  . TEE WITHOUT CARDIOVERSION N/A 11/29/2014  Procedure: TRANSESOPHAGEAL ECHOCARDIOGRAM (TEE);  Surgeon: Melrose Nakayama, MD;  Location: [place];  Service: Open Heart Surgery;  Laterality: N/A;  . TRANSTHORACIC ECHOCARDIOGRAM  07/23/2012   EF 55-60%, normal-mild  . TUBAL LIGATION       Social History:  Ambulatory   Independently     reports that she quit smoking about 47 years ago. Her smoking use included Cigarettes. She has a 0.75 pack-year smoking history. She has never used smokeless tobacco. She reports that she does not drink alcohol or use drugs.  Allergies:   Allergies  Allergen Reactions  . Ivp Dye [Iodinated Diagnostic Agents] Shortness Of Breath    No reaction to PO contrast with  non-ionic dye.06-25-2014/rsm  . Shellfish-Derived Products Anaphylaxis  . Sulfa Antibiotics Shortness Of Breath  . Iodine Hives  . Cephalexin Itching and Rash    Rash & itch after Keflex  . Zithromax [Azithromycin] Rash       Family History:   Family History  Problem Relation Age of Onset  . Breast cancer Mother   . Diabetes Mother   . Heart disease Maternal Grandmother   . Kidney disease Maternal Grandmother   . Diabetes Maternal Grandmother   . Glaucoma Maternal Aunt   . Heart disease Maternal Aunt   . Asthma Sister     Medications: Prior to Admission medications   Medication Sig Start Date End Date Taking? Authorizing Provider  albuterol (PROVENTIL HFA;VENTOLIN HFA) 108 (90 BASE) MCG/ACT inhaler Inhale 2 puffs into the lungs every 4 (four) hours as needed for wheezing (wheezing).     Historical Provider, MD  aspirin EC 325 MG EC tablet Take 1 tablet (325 mg total) by mouth daily. 12/07/14   Gina L Collins, PA-C  carvedilol (COREG) 6.25 MG tablet TAKE ONE TABLET BY MOUTH TWICE DAILY WITH  A  MEAL 10/22/15   [name], MD  cetirizine (ZYRTEC) 10 MG tablet Take 10 mg by mouth daily.     Historical Provider, MD  clotrimazole (MYCELEX) 10 MG troche Take 10 mg by mouth every 4 (four) hours as needed (mouth pain).    Historical Provider, MD  EPINEPHrine 0.3 mg/0.3 mL IJ SOAJ injection  10/17/15   Historical Provider, MD  fluticasone (FLONASE) 50 MCG/ACT nasal spray Place 1 spray into the nose 2 (two) times daily as needed for rhinitis or allergies.     Historical Provider, MD  HYDROcodone-acetaminophen (NORCO/VICODIN) 5-325 MG tablet Take 1-2 tablets by mouth every 4 (four) hours as needed for moderate pain or severe pain. 05/05/15   Geradine Girt, DO  isosorbide mononitrate (IMDUR) 30 MG 24 hr tablet Take 1 tablet (30 mg total) by mouth daily. 10/19/15 10/13/16  [name], MD  KLOR-CON M20 20 MEQ tablet TAKE ONE TABLET BY MOUTH ONCE DAILY Patient taking differently: TAKE 20 MEQ  BY MOUTH ONCE DAILY 04/05/15   [name], MD  omeprazole (PRILOSEC) 40 MG capsule Take 1 capsule (40 mg total) by mouth 2 (two) times daily before a meal. 07/12/15   [name], MD  pravastatin (PRAVACHOL) 80 MG tablet Take 1 tablet (80 mg total) by mouth every evening. 10/31/14   [name], MD  spironolactone (ALDACTONE) 25 MG tablet Take 25 mg by mouth daily. 11/10/14   Historical Provider, MD  spironolactone (ALDACTONE) 25 MG tablet TAKE ONE-HALF TABLET BY MOUTH TWICE DAILY 10/22/15   Sanda Klein, MD  SYMBICORT 160-4.5 MCG/ACT inhaler Inhale 2 puffs into the lungs 2 (two) times daily. 12/02/13  Historical Provider, MD  torsemide (DEMADEX) 20 MG tablet Take 40 mg by mouth 2 (two) times daily.    Historical Provider, MD  triamcinolone cream (KENALOG) 0.1 % Apply 1 application topically daily as needed (affected area).  08/24/14   Historical Provider, MD    Physical Exam: Patient Vitals for the past 24 hrs:  BP Temp Temp src Pulse Resp SpO2  10/22/15 2252 128/61 - - - 24 93 %  10/22/15 2030 126/70 - - 69 24 94 %  10/22/15 1706 126/75 - - 73 18 91 %  10/22/15 1704 - 98 F (36.7 C) Oral - - -    1. General:  in No Acute distress 2. Psychological: Alert and   Oriented 3. Head/ENT:    Dry Mucous Membranes                          Head Non traumatic, neck supple                          Normal  Dentition 4. SKIN: normal  Skin turgor,  Skin clean Dry and intact no rash 5. Heart: Regular rate and rhythm no  Murmur, Rub or gallop 6. Lungs:  distant no wheezes or crackles   7. Abdomen: Soft,  non-tender, Non distended Obese 8. Lower extremities: no clubbing, cyanosis, or edema obese 9. Neurologically   strength 3 out of 5 on the left 5 out of 5 on the right, cranial nerves II through XII intact, decreased sensation in left arm 10. MSK: Normal range of motion   body mass index is unknown because there is no height or weight on file.  Labs on Admission:   Labs on Admission:  I have personally reviewed following labs and imaging studies  CBC:  Recent Labs Lab 10/22/15 2134  WBC 7.6  NEUTROABS 5.3  HGB 13.2  HCT 43.7  MCV 88.6  PLT 426   Basic Metabolic Panel:  Recent Labs Lab 10/22/15 2134  NA 140  K 4.3  CL 104  CO2 28  GLUCOSE 117*  BUN 27*  CREATININE 1.18*  CALCIUM 9.2   GFR: Estimated Creatinine Clearance: 54.1 mL/min (by C-G formula based on SCr of 1.18 mg/dL). Liver Function Tests:  Recent Labs Lab 10/22/15 2134  AST 17  ALT 22  ALKPHOS 97  BILITOT 0.4  PROT 7.1  ALBUMIN 3.5   No results for input(s): LIPASE, AMYLASE in the last 168 hours. No results for input(s): AMMONIA in the last 168 hours. Coagulation Profile: No results for input(s): INR, PROTIME in the last 168 hours. Cardiac Enzymes: No results for input(s): CKTOTAL, CKMB, CKMBINDEX, TROPONINI in the last 168 hours. BNP (last 3 results) No results for input(s): PROBNP in the last 8760 hours. HbA1C: No results for input(s): HGBA1C in the last 72 hours. CBG: No results for input(s): GLUCAP in the last 168 hours. Lipid Profile: No results for input(s): CHOL, HDL, LDLCALC, TRIG, CHOLHDL, LDLDIRECT in the last 72 hours. Thyroid Function Tests: No results for input(s): TSH, T4TOTAL, FREET4, T3FREE, THYROIDAB in the last 72 hours. Anemia Panel: No results for input(s): VITAMINB12, FOLATE, FERRITIN, TIBC, IRON, RETICCTPCT in the last 72 hours. Urine analysis: Sepsis Labs: @LABRCNTIP (procalcitonin:4,lacticidven:4) )No results found for this or any previous visit (from the past 240 hour(s)).    UA   ordered  Lab Results  Component Value Date   HGBA1C 6.7 (H) 11/23/2014  Estimated Creatinine Clearance: 54.1 mL/min (by C-G formula based on SCr of 1.18 mg/dL).  BNP (last 3 results) No results for input(s): PROBNP in the last 8760 hours.   ECG REPORT  Independently reviewed Rate:67  Rhythm: Paced ST&T Change: N/A QTC 499  There were no vitals filed for  this visit.   Cultures:    Component Value Date/Time   SDES URINE, CLEAN CATCH 11/26/2014 1009   SPECREQUEST None 11/26/2014 1009   CULT MULTIPLE SPECIES PRESENT, SUGGEST RECOLLECTION 11/26/2014 1009   REPTSTATUS 11/27/2014 FINAL 11/26/2014 1009     Radiological Exams on Admission: Ct Head Wo Contrast  Result Date: 10/22/2015 CLINICAL DATA:  Left arm and leg paresthesias, onset at 11:00. Partial resolution since that time. EXAM: CT HEAD WITHOUT CONTRAST CT CERVICAL SPINE WITHOUT CONTRAST TECHNIQUE: Multidetector CT imaging of the head and cervical spine was performed following the standard protocol without intravenous contrast. Multiplanar CT image reconstructions of the cervical spine were also generated. COMPARISON:  None. FINDINGS: CT HEAD FINDINGS Brain: There is no intracranial hemorrhage, mass or evidence of acute infarction. Remote lacunar infarction in the left caudate. There is mild generalized atrophy. There is mild chronic microvascular ischemic change. There is no significant extra-axial fluid collection. No acute intracranial findings are evident. Vascular: No hyperdense vessel or unexpected calcification. Skull: Normal. Negative for fracture or focal lesion. Sinuses/Orbits: No acute finding. CT CERVICAL SPINE FINDINGS Alignment: Mild straightening of cervical lordosis. Prior anterior interbody fusion with plate screw fixation hardware at C4-5. Skull base and vertebrae: No acute fracture. No primary bone lesion or focal pathologic process. Soft tissues and spinal canal: No prevertebral fluid or swelling. No visible canal hematoma. Disc levels: Moderate degenerative disc changes from C4 through C7. Prominent posterior osteophytes at C5-6 and C6-7 likely cause mild degenerative central canal stenosis. Upper chest: No significant abnormality Other: IMPRESSION: 1. No acute intracranial findings. There is mild generalized atrophy and chronic appearing white matter hypodensities which likely  represent small vessel ischemic disease. Remote left caudate lacunar infarction. 2. No acute cervical spine findings. Prior C4-5 anterior interbody fusion. Moderate degenerative cervical disc disease from C4 through C7. Electronically Signed   By: Andreas Newport M.D.   On: 10/22/2015 22:49   Ct Cervical Spine Wo Contrast  Result Date: 10/22/2015 CLINICAL DATA:  Left arm and leg paresthesias, onset at 11:00. Partial resolution since that time. EXAM: CT HEAD WITHOUT CONTRAST CT CERVICAL SPINE WITHOUT CONTRAST TECHNIQUE: Multidetector CT imaging of the head and cervical spine was performed following the standard protocol without intravenous contrast. Multiplanar CT image reconstructions of the cervical spine were also generated. COMPARISON:  None. FINDINGS: CT HEAD FINDINGS Brain: There is no intracranial hemorrhage, mass or evidence of acute infarction. Remote lacunar infarction in the left caudate. There is mild generalized atrophy. There is mild chronic microvascular ischemic change. There is no significant extra-axial fluid collection. No acute intracranial findings are evident. Vascular: No hyperdense vessel or unexpected calcification. Skull: Normal. Negative for fracture or focal lesion. Sinuses/Orbits: No acute finding. CT CERVICAL SPINE FINDINGS Alignment: Mild straightening of cervical lordosis. Prior anterior interbody fusion with plate screw fixation hardware at C4-5. Skull base and vertebrae: No acute fracture. No primary bone lesion or focal pathologic process. Soft tissues and spinal canal: No prevertebral fluid or swelling. No visible canal hematoma. Disc levels: Moderate degenerative disc changes from C4 through C7. Prominent posterior osteophytes at C5-6 and C6-7 likely cause mild degenerative central canal stenosis. Upper chest: No significant abnormality Other: IMPRESSION: 1.  No acute intracranial findings. There is mild generalized atrophy and chronic appearing white matter hypodensities  which likely represent small vessel ischemic disease. Remote left caudate lacunar infarction. 2. No acute cervical spine findings. Prior C4-5 anterior interbody fusion. Moderate degenerative cervical disc disease from C4 through C7. Electronically Signed   By: Andreas Newport M.D.   On: 10/22/2015 22:49    Chart has been reviewed    Assessment/Plan  70 y.o. female with medical history significant of CAD s/p CABG October 2016 (LIMA to LAD, SVG to D1, SVG to OM1 and OM2, SVG to PDA), diastolic heart failure, morbid obesity, adrenal insufficiency, type 2 diabetes mellitus, Hypertension, stage III chronic kidney disease, asthma, obstructive sleep apnea. Nonischemic cardiomyopathy complete heart bark status post biventricular ICD in 2008 here with TIA not a candidate for MRI secondary to history of pacemaker being admitted for TIA like symptoms as well as worsening dyspnea worrisome for asthma exacerbation  Present on Admission: . TIA (transient ischemic attack) not a candidate for MRI secondary to history of pacemaker  - will admit based on TIA/CVA protocol,   Carotid Doppler and Echo, obtain cardiac enzymes,  ECG,   Lipid panel, TSH. Order PT/OT evaluation. Will make sure patient is on antiplatelet agent and continue aspirin.   Neurology consulted.     . Asthma exacerbation - albuterol when necessary, Dulera, steroid taper  . CAD, multiple vessel intermittent chest pain continue and/or continue statins and aspirin . Chronic diastolic heart failure (HCC) continue torsemide . Complete heart block (HCC) - sp post pacemaker . Essential hypertension - given intermittent chest pain we'll continue Coreg and Imdur . Mixed hypercholesterolemia and hypertriglyceridemia continue statin check cholesterol panel . OSA (obstructive sleep apnea) she does not use his sleep apnea machine at home Chest pain exertional chest pain patient has been seen for this by cardiology who treated with initiation of imdur with   improvement curently  chest pain free dyspnea likely secondary to asthma exacerbation will obtain chest x-ray Dyspnea most likely secondary to deconditioning and asthma exacerbation although patient has also significant history of coronary artery disease and CHF. Will obtain chest x-ray cycle cardiac cancer in's monitor on telemetry plan as per orders.  DVT prophylaxis:  SCD   Code Status:  FULL CODE  as per patient    Family Communication:   Family not  at  Bedside     Disposition Plan:     To home once workup is complete and patient is stable                         Would benefit from PT/OT eval prior to DC                                                  Consults called: neurology aware    Admission status:  obs   Level of care     tele           I have spent a total of 58 min on this admission  Kielyn Kardell 10/23/2015, 12:44 AM    [facility]  Pager [phone]   after 2 AM please page floor coverage PA If 7AM-7PM, please contact the day team taking care of the patient  [email or web address]  Password [email or web address]

## 2015-10-23 ENCOUNTER — Emergency Department (HOSPITAL_COMMUNITY): Payer: Commercial Managed Care - HMO

## 2015-10-23 ENCOUNTER — Other Ambulatory Visit (HOSPITAL_COMMUNITY): Payer: Commercial Managed Care - HMO

## 2015-10-23 ENCOUNTER — Encounter (HOSPITAL_COMMUNITY): Payer: Commercial Managed Care - HMO

## 2015-10-23 ENCOUNTER — Encounter (HOSPITAL_COMMUNITY): Payer: Self-pay | Admitting: Family Medicine

## 2015-10-23 DIAGNOSIS — G459 Transient cerebral ischemic attack, unspecified: Secondary | ICD-10-CM | POA: Diagnosis not present

## 2015-10-23 DIAGNOSIS — I5032 Chronic diastolic (congestive) heart failure: Secondary | ICD-10-CM | POA: Diagnosis not present

## 2015-10-23 DIAGNOSIS — R079 Chest pain, unspecified: Secondary | ICD-10-CM | POA: Diagnosis present

## 2015-10-23 DIAGNOSIS — R0602 Shortness of breath: Secondary | ICD-10-CM | POA: Diagnosis not present

## 2015-10-23 DIAGNOSIS — I442 Atrioventricular block, complete: Secondary | ICD-10-CM | POA: Diagnosis not present

## 2015-10-23 DIAGNOSIS — I10 Essential (primary) hypertension: Secondary | ICD-10-CM | POA: Diagnosis not present

## 2015-10-23 LAB — CUP PACEART INCLINIC DEVICE CHECK
Battery Remaining Longevity: 96 mo
Battery Remaining Percentage: 100 %
Brady Statistic RA Percent Paced: 0 %
Brady Statistic RV Percent Paced: 100 %
Date Time Interrogation Session: 20170907141500
Implantable Lead Implant Date: 20080923
Implantable Lead Implant Date: 20080923
Implantable Lead Implant Date: 20080923
Implantable Lead Location: 753858
Implantable Lead Location: 753859
Implantable Lead Location: 753860
Implantable Lead Model: 4285
Implantable Lead Model: 4555
Implantable Lead Model: 7120
Implantable Lead Serial Number: 170220
Implantable Lead Serial Number: 486468
Lead Channel Impedance Value: 632 Ohm
Lead Channel Impedance Value: 803 Ohm
Lead Channel Impedance Value: 879 Ohm
Lead Channel Pacing Threshold Amplitude: 0.7 V
Lead Channel Pacing Threshold Amplitude: 0.8 V
Lead Channel Pacing Threshold Pulse Width: 0.4 ms
Lead Channel Pacing Threshold Pulse Width: 0.8 ms
Lead Channel Setting Pacing Amplitude: 1.9 V
Lead Channel Setting Pacing Amplitude: 2 V
Lead Channel Setting Pacing Pulse Width: 0.4 ms
Lead Channel Setting Pacing Pulse Width: 0.8 ms
Lead Channel Setting Sensing Sensitivity: 2.5 mV
Lead Channel Setting Sensing Sensitivity: 2.5 mV
Pulse Gen Serial Number: 100731

## 2015-10-23 LAB — URINALYSIS, ROUTINE W REFLEX MICROSCOPIC
Bilirubin Urine: NEGATIVE
Glucose, UA: NEGATIVE mg/dL
Hgb urine dipstick: NEGATIVE
Ketones, ur: NEGATIVE mg/dL
Nitrite: NEGATIVE
Protein, ur: NEGATIVE mg/dL
Specific Gravity, Urine: 1.026 (ref 1.005–1.030)
pH: 5.5 (ref 5.0–8.0)

## 2015-10-23 LAB — LIPID PANEL
Cholesterol: 241 mg/dL — ABNORMAL HIGH (ref 0–200)
HDL: 40 mg/dL — ABNORMAL LOW (ref >40)
LDL Cholesterol: 167 mg/dL — ABNORMAL HIGH (ref 0–99)
Total CHOL/HDL Ratio: 6 RATIO
Triglycerides: 172 mg/dL — ABNORMAL HIGH (ref <150)
VLDL: 34 mg/dL (ref 0–40)

## 2015-10-23 LAB — URINE MICROSCOPIC-ADD ON: RBC / HPF: NONE SEEN RBC/hpf (ref 0–5)

## 2015-10-23 LAB — D-DIMER, QUANTITATIVE: D-Dimer, Quant: 1.56 ug/mL-FEU — ABNORMAL HIGH (ref 0.00–0.50)

## 2015-10-23 LAB — TROPONIN I
Troponin I: <0.03 ng/mL (ref <0.03)
Troponin I: <0.03 ng/mL (ref <0.03)
Troponin I: <0.03 ng/mL (ref <0.03)

## 2015-10-23 MED ORDER — ALBUTEROL SULFATE (2.5 MG/3ML) 0.083% IN NEBU
2.5000 mg | INHALATION_SOLUTION | RESPIRATORY_TRACT | Status: DC | PRN
  Administered 2015-10-23: 2.5 mg via RESPIRATORY_TRACT
  Filled 2015-10-23: qty 3

## 2015-10-23 MED ORDER — PREDNISONE 20 MG PO TABS
40.0000 mg | ORAL_TABLET | Freq: Every day | ORAL | Status: DC
  Administered 2015-10-24: 40 mg via ORAL
  Filled 2015-10-23: qty 2

## 2015-10-23 MED ORDER — LEVOFLOXACIN 500 MG PO TABS
500.0000 mg | ORAL_TABLET | ORAL | Status: DC
  Administered 2015-10-23: 500 mg via ORAL
  Filled 2015-10-23 (×2): qty 1

## 2015-10-23 MED ORDER — PRAVASTATIN SODIUM 40 MG PO TABS: 80.0000 mg | ORAL_TABLET | Freq: Every evening | ORAL | Status: DC

## 2015-10-23 MED ORDER — PANTOPRAZOLE SODIUM 40 MG PO TBEC
40.0000 mg | DELAYED_RELEASE_TABLET | Freq: Every day | ORAL | Status: DC
  Administered 2015-10-23 – 2015-10-24 (×2): 40 mg via ORAL
  Filled 2015-10-23 (×2): qty 1

## 2015-10-23 MED ORDER — FLUTICASONE PROPIONATE 50 MCG/ACT NA SUSP
1.0000 | Freq: Every day | NASAL | Status: DC
  Administered 2015-10-24: 1 via NASAL
  Filled 2015-10-23: qty 16

## 2015-10-23 MED ORDER — STROKE: EARLY STAGES OF RECOVERY BOOK
Freq: Once | Status: AC
  Administered 2015-10-23: 1
  Filled 2015-10-23: qty 1

## 2015-10-23 MED ORDER — CLOPIDOGREL BISULFATE 75 MG PO TABS
75.0000 mg | ORAL_TABLET | Freq: Every day | ORAL | Status: DC
  Administered 2015-10-24: 75 mg via ORAL
  Filled 2015-10-23: qty 1

## 2015-10-23 MED ORDER — ISOSORBIDE MONONITRATE ER 30 MG PO TB24
30.0000 mg | ORAL_TABLET | Freq: Every day | ORAL | Status: DC
  Administered 2015-10-23 – 2015-10-24 (×2): 30 mg via ORAL
  Filled 2015-10-23 (×2): qty 1

## 2015-10-23 MED ORDER — LORATADINE 10 MG PO TABS
10.0000 mg | ORAL_TABLET | Freq: Every day | ORAL | Status: DC
  Administered 2015-10-23 – 2015-10-24 (×2): 10 mg via ORAL
  Filled 2015-10-23 (×2): qty 1

## 2015-10-23 MED ORDER — IPRATROPIUM-ALBUTEROL 0.5-2.5 (3) MG/3ML IN SOLN
3.0000 mL | Freq: Once | RESPIRATORY_TRACT | Status: AC
  Administered 2015-10-23: 3 mL via RESPIRATORY_TRACT
  Filled 2015-10-23: qty 3

## 2015-10-23 MED ORDER — MOMETASONE FURO-FORMOTEROL FUM 200-5 MCG/ACT IN AERO
2.0000 | INHALATION_SPRAY | Freq: Two times a day (BID) | RESPIRATORY_TRACT | Status: DC
  Administered 2015-10-23 – 2015-10-24 (×2): 2 via RESPIRATORY_TRACT
  Filled 2015-10-23: qty 8.8

## 2015-10-23 MED ORDER — CARVEDILOL 6.25 MG PO TABS
6.2500 mg | ORAL_TABLET | Freq: Two times a day (BID) | ORAL | Status: DC
  Administered 2015-10-23 – 2015-10-24 (×4): 6.25 mg via ORAL
  Filled 2015-10-23 (×4): qty 1

## 2015-10-23 MED ORDER — POTASSIUM CHLORIDE CRYS ER 20 MEQ PO TBCR
20.0000 meq | EXTENDED_RELEASE_TABLET | Freq: Every day | ORAL | Status: DC
  Administered 2015-10-23 – 2015-10-24 (×2): 20 meq via ORAL
  Filled 2015-10-23 (×2): qty 1

## 2015-10-23 MED ORDER — PREDNISONE 20 MG PO TABS
40.0000 mg | ORAL_TABLET | Freq: Two times a day (BID) | ORAL | Status: DC
  Administered 2015-10-23: 40 mg via ORAL
  Filled 2015-10-23: qty 2

## 2015-10-23 MED ORDER — ASPIRIN EC 325 MG PO TBEC
325.0000 mg | DELAYED_RELEASE_TABLET | Freq: Every day | ORAL | Status: DC
  Administered 2015-10-23: 325 mg via ORAL
  Filled 2015-10-23: qty 1

## 2015-10-23 MED ORDER — SPIRONOLACTONE 25 MG PO TABS
25.0000 mg | ORAL_TABLET | Freq: Every day | ORAL | Status: DC
  Administered 2015-10-23 – 2015-10-24 (×2): 25 mg via ORAL
  Filled 2015-10-23 (×2): qty 1

## 2015-10-23 MED ORDER — ACETAMINOPHEN 325 MG PO TABS
650.0000 mg | ORAL_TABLET | ORAL | Status: DC | PRN
  Administered 2015-10-23 – 2015-10-24 (×2): 650 mg via ORAL
  Filled 2015-10-23 (×3): qty 2

## 2015-10-23 MED ORDER — GUAIFENESIN ER 600 MG PO TB12
600.0000 mg | ORAL_TABLET | Freq: Two times a day (BID) | ORAL | Status: DC
  Administered 2015-10-23 – 2015-10-24 (×3): 600 mg via ORAL
  Filled 2015-10-23 (×3): qty 1

## 2015-10-23 MED ORDER — HYDROCODONE-ACETAMINOPHEN 5-325 MG PO TABS: 1.0000 | ORAL_TABLET | ORAL | Status: DC | PRN

## 2015-10-23 MED ORDER — ATORVASTATIN CALCIUM 80 MG PO TABS
80.0000 mg | ORAL_TABLET | Freq: Every day | ORAL | Status: DC
  Administered 2015-10-23 – 2015-10-24 (×2): 80 mg via ORAL
  Filled 2015-10-23 (×2): qty 1

## 2015-10-23 MED ORDER — SENNOSIDES-DOCUSATE SODIUM 8.6-50 MG PO TABS: 1.0000 | ORAL_TABLET | Freq: Every evening | ORAL | Status: DC | PRN

## 2015-10-23 MED ORDER — ACETAMINOPHEN 650 MG RE SUPP: 650.0000 mg | RECTAL | Status: DC | PRN

## 2015-10-23 MED ORDER — TORSEMIDE 20 MG PO TABS
40.0000 mg | ORAL_TABLET | Freq: Two times a day (BID) | ORAL | Status: DC
  Administered 2015-10-23 – 2015-10-24 (×3): 40 mg via ORAL
  Filled 2015-10-23 (×3): qty 2

## 2015-10-23 NOTE — Progress Notes (Addendum)
PROGRESS NOTE  [name]  GQQ:[id number] DOB: [birth year] DOA: 10/22/2015 PCP: [name], MD  Outpatient Specialists: Cardiology, Dr. [name] and Dr. Haroldine Laws   Brief Narrative: [name] is a 70 y.o. female with medical history of CAD s/p CABG October 2016 (LIMA to LAD, SVG to D1, SVG to OM1 and OM2, SVG to PDA), heart block s/p bivent pacer 2008, chronic HFpEF, morbid obesity, adrenal insufficiency, T2DM, HTN, stage III CKD, asthma, and OSA who presented with transient left arm and leg numbness and difficulty with gait. She also reported chronic chest pains and worsening dyspnea. She was admitted for CVA work up, ACS rule out, and treatment for presumed asthma exacerbation. Neurological deficits have resolved. She has a pacemaker not compatible with MRI and she has an allergy to IV contrast, so CVA work up was limited. Head CT showed remote left lacunar infarct. CT neck without contrast was unrevealing. Her chest pain resolved. ECG was paced, troponins negative, BNP was normal and she was under EDW. CXR showed no definite infiltrate, though she reported increased dyspnea and sputum production. She was given steroids and antibiotics.   Echo, carotid and transcranial dopplers are pending.   Assessment & Plan: Active Problems:   Essential hypertension   Asthma   OSA (obstructive sleep apnea)   Mixed hypercholesterolemia and hypertriglyceridemia   CAD, multiple vessel   Chronic diastolic heart failure (HCC)   Complete heart block (HCC)   TIA (transient ischemic attack)   Chest pain  TIA: No acute CVA on CT, symptoms resolved. MRI contraindicated due to pacemaker. IV contrast contraindicated due to allergy. Awaiting further work up.  - Stroke team following - Awaiting echo, TCD, carotid dopplers - Permissive HTN, though coreg and imdur are continued due to chest pain given negative imaging to date. - PT/OT/SLP: Home health PT recommended and ordered - Continue ASA -  LDL 167. Augment statin therapy: prava > atorvastatin 80mg .   Asthma exacerbation: With no definite infiltrate on CXR, but increased sputum and dyspnea. Exam reassuring - Levaquin 500mg  daily (9/12 >> 9/18) - Prednisone 40mg  daily (9/11 >> 9/15) - Continue formulary ICS, albuterol prn  CAD s/p multivessel CABG and chronic diastolic heart failure: complicated by heart block with biventricular pacer, interrogation showing 100% pacing. No evidence of acute exacerbation.  - Continue home beta blocker, imdur (recently started for chest pain), torsemide, spiro.  - Continue statin - Low suspicion for ACS: troponins negative, ECG paced, echo pending  OSA: Not adherent to CPAP at home  Bacteriuria and pyuria: Asymptomatic, so no treatment indicated - Urine culture  DVT prophylaxis: SCD Code Status: Full code Family Communication: None at bedside, pt declined offer to call. Disposition Plan: TIA work up incomplete, will likely discharge 9/13 pending results.   Consultants:   Stroke team, Dr. [name]  Procedures:   Echo pending  Antimicrobials:  Levaquin 9/12 > 9/18   Subjective: Pt reports improvement in chest pain but worsening productive cough. No new neurological deficits.   Objective: Vitals:   10/23/15 0454 10/23/15 0657 10/23/15 0956 10/23/15 1200  BP: 110/63 (!) 109/47 120/81 118/70  Pulse:  67 70 72  Resp:  20 16   Temp:  97.3 F (36.3 C) 97.7 F (36.5 C)   TempSrc:  Oral Axillary   SpO2:  95% 100% 94%  Weight:      Height:       No intake or output data in the 24 hours ending 10/23/15 1607 Filed Weights   10/23/15  4098  Weight: 114.7 kg (252 lb 14.4 oz)    Examination: General exam: 70 y.o. female in no distress Respiratory system: Non-labored breathing. Mildly prolonged expiration without wheezing. No crackles.  Cardiovascular system: Regular rate and rhythm. No murmur, rub, or gallop. No JVD, and trace pedal edema. Gastrointestinal system: Abdomen soft,  non-tender, non-distended, with normoactive bowel sounds. No organomegaly or masses felt. Central nervous system: Alert and oriented. Abnormal sensation of left arm. Strength preserved. Gait slow and mild imbalance with assistance. Speech wnl.  Extremities: Warm, no deformities Skin: No rashes, lesions no ulcers Psychiatry: Judgement and insight appear normal. Mood & affect appropriate.   Data Reviewed: I have personally reviewed following labs and imaging studies  CBC:  Recent Labs Lab 10/22/15 2134  WBC 7.6  NEUTROABS 5.3  HGB 13.2  HCT 43.7  MCV 88.6  PLT 119   Basic Metabolic Panel:  Recent Labs Lab 10/22/15 2134  NA 140  K 4.3  CL 104  CO2 28  GLUCOSE 117*  BUN 27*  CREATININE 1.18*  CALCIUM 9.2   GFR: Estimated Creatinine Clearance: 55.3 mL/min (by C-G formula based on SCr of 1.18 mg/dL). Liver Function Tests:  Recent Labs Lab 10/22/15 2134  AST 17  ALT 22  ALKPHOS 97  BILITOT 0.4  PROT 7.1  ALBUMIN 3.5   No results for input(s): LIPASE, AMYLASE in the last 168 hours. No results for input(s): AMMONIA in the last 168 hours. Coagulation Profile: No results for input(s): INR, PROTIME in the last 168 hours. Cardiac Enzymes:  Recent Labs Lab 10/23/15 0414 10/23/15 0958  TROPONINI <0.03 <0.03   BNP (last 3 results) No results for input(s): PROBNP in the last 8760 hours. HbA1C: No results for input(s): HGBA1C in the last 72 hours. CBG: No results for input(s): GLUCAP in the last 168 hours. Lipid Profile:  Recent Labs  10/23/15 0414  CHOL 241*  HDL 40*  LDLCALC 167*  TRIG 172*  CHOLHDL 6.0   Thyroid Function Tests: No results for input(s): TSH, T4TOTAL, FREET4, T3FREE, THYROIDAB in the last 72 hours. Anemia Panel: No results for input(s): VITAMINB12, FOLATE, FERRITIN, TIBC, IRON, RETICCTPCT in the last 72 hours. Urine analysis:    Component Value Date/Time   COLORURINE YELLOW 10/22/2015 2353   APPEARANCEUR CLOUDY (A) 10/22/2015 2353     LABSPEC 1.026 10/22/2015 2353   PHURINE 5.5 10/22/2015 2353   GLUCOSEU NEGATIVE 10/22/2015 2353   HGBUR NEGATIVE 10/22/2015 2353   BILIRUBINUR NEGATIVE 10/22/2015 2353   KETONESUR NEGATIVE 10/22/2015 2353   PROTEINUR NEGATIVE 10/22/2015 2353   UROBILINOGEN 0.2 12/06/2014 1531   NITRITE NEGATIVE 10/22/2015 2353   LEUKOCYTESUR SMALL (A) 10/22/2015 2353   Sepsis Labs: @LABRCNTIP (procalcitonin:4,lacticidven:4)  )No results found for this or any previous visit (from the past 240 hour(s)).   Radiology Studies: Dg Chest 2 View  Result Date: 10/23/2015 CLINICAL DATA:  Midchest pain for 1 week.  Shortness of breath. EXAM: CHEST  2 VIEW COMPARISON:  Chest radiograph 06/20/2015 FINDINGS: Right chest wall AICD leads are unchanged in position. Cardiomediastinal contours are unchanged. No focal airspace consolidation or pulmonary edema. No pneumothorax or sizable pleural effusion. CABG markers and median sternotomy wires are unchanged. IMPRESSION: No active cardiopulmonary disease.  Aortic atherosclerosis. Electronically Signed   By: Ulyses Jarred M.D.   On: 10/23/2015 00:58   Ct Head Wo Contrast  Result Date: 10/22/2015 CLINICAL DATA:  Left arm and leg paresthesias, onset at 11:00. Partial resolution since that time. EXAM: CT HEAD WITHOUT CONTRAST  CT CERVICAL SPINE WITHOUT CONTRAST TECHNIQUE: Multidetector CT imaging of the head and cervical spine was performed following the standard protocol without intravenous contrast. Multiplanar CT image reconstructions of the cervical spine were also generated. COMPARISON:  None. FINDINGS: CT HEAD FINDINGS Brain: There is no intracranial hemorrhage, mass or evidence of acute infarction. Remote lacunar infarction in the left caudate. There is mild generalized atrophy. There is mild chronic microvascular ischemic change. There is no significant extra-axial fluid collection. No acute intracranial findings are evident. Vascular: No hyperdense vessel or unexpected  calcification. Skull: Normal. Negative for fracture or focal lesion. Sinuses/Orbits: No acute finding. CT CERVICAL SPINE FINDINGS Alignment: Mild straightening of cervical lordosis. Prior anterior interbody fusion with plate screw fixation hardware at C4-5. Skull base and vertebrae: No acute fracture. No primary bone lesion or focal pathologic process. Soft tissues and spinal canal: No prevertebral fluid or swelling. No visible canal hematoma. Disc levels: Moderate degenerative disc changes from C4 through C7. Prominent posterior osteophytes at C5-6 and C6-7 likely cause mild degenerative central canal stenosis. Upper chest: No significant abnormality Other: IMPRESSION: 1. No acute intracranial findings. There is mild generalized atrophy and chronic appearing white matter hypodensities which likely represent small vessel ischemic disease. Remote left caudate lacunar infarction. 2. No acute cervical spine findings. Prior C4-5 anterior interbody fusion. Moderate degenerative cervical disc disease from C4 through C7. Electronically Signed   By: Andreas Newport M.D.   On: 10/22/2015 22:49   Ct Cervical Spine Wo Contrast  Result Date: 10/22/2015 CLINICAL DATA:  Left arm and leg paresthesias, onset at 11:00. Partial resolution since that time. EXAM: CT HEAD WITHOUT CONTRAST CT CERVICAL SPINE WITHOUT CONTRAST TECHNIQUE: Multidetector CT imaging of the head and cervical spine was performed following the standard protocol without intravenous contrast. Multiplanar CT image reconstructions of the cervical spine were also generated. COMPARISON:  None. FINDINGS: CT HEAD FINDINGS Brain: There is no intracranial hemorrhage, mass or evidence of acute infarction. Remote lacunar infarction in the left caudate. There is mild generalized atrophy. There is mild chronic microvascular ischemic change. There is no significant extra-axial fluid collection. No acute intracranial findings are evident. Vascular: No hyperdense vessel or  unexpected calcification. Skull: Normal. Negative for fracture or focal lesion. Sinuses/Orbits: No acute finding. CT CERVICAL SPINE FINDINGS Alignment: Mild straightening of cervical lordosis. Prior anterior interbody fusion with plate screw fixation hardware at C4-5. Skull base and vertebrae: No acute fracture. No primary bone lesion or focal pathologic process. Soft tissues and spinal canal: No prevertebral fluid or swelling. No visible canal hematoma. Disc levels: Moderate degenerative disc changes from C4 through C7. Prominent posterior osteophytes at C5-6 and C6-7 likely cause mild degenerative central canal stenosis. Upper chest: No significant abnormality Other: IMPRESSION: 1. No acute intracranial findings. There is mild generalized atrophy and chronic appearing white matter hypodensities which likely represent small vessel ischemic disease. Remote left caudate lacunar infarction. 2. No acute cervical spine findings. Prior C4-5 anterior interbody fusion. Moderate degenerative cervical disc disease from C4 through C7. Electronically Signed   By: Andreas Newport M.D.   On: 10/22/2015 22:49    Scheduled Meds: . aspirin  325 mg Oral Daily  . carvedilol  6.25 mg Oral BID WC  . fluticasone  1 spray Each Nare Daily  . guaiFENesin  600 mg Oral BID  . isosorbide mononitrate  30 mg Oral Daily  . loratadine  10 mg Oral Daily  . mometasone-formoterol  2 puff Inhalation BID  . pantoprazole  40 mg Oral  Daily  . potassium chloride SA  20 mEq Oral Daily  . pravastatin  80 mg Oral QPM  . predniSONE  40 mg Oral BID  . spironolactone  25 mg Oral Daily  . torsemide  40 mg Oral BID   Continuous Infusions:    LOS: 0 days   Time spent: 25 minutes.  Vance Gather, MD [facility] Pager [phone]  If 7PM-7AM, please contact night-coverage [email or web address] Password [facility] 10/23/2015, 4:07 PM

## 2015-10-23 NOTE — Progress Notes (Signed)
STROKE TEAM PROGRESS NOTE   HISTORY OF PRESENT ILLNESS (per record) [name] is an 70 y.o. female history of hypertension, hyperlipidemia, obstructive sleep apnea, obesity, coronary artery disease and implanted cardiac defibrillator, who presented to the ED at [facility] following an episode of transient weakness and numbness involving left arm and left leg. She was last known well at 11 AM on 10/22/2015. She has no history of stroke nor TIA. She's been taking aspirin daily. Deficits resolved after about 30 minutes and have not recurred. CT scan of her head showed no acute intracranial abnormality. NIH stroke score at the time of this evaluation was 0.  LSN: 11:00 AM on 10/22/2015 tPA Given: No: Deficits rapidly resolved mRankin:   SUBJECTIVE (INTERVAL HISTORY) NO family is at the bedside.  Overall she feels her condition is rapidly improving, near baseline now. Still feel left arm tingling and left leg dragging on walking with PT. Pacer interrogation showed 100% pacing.    OBJECTIVE Temp:  [97.3 F (36.3 C)-98 F (36.7 C)] 97.7 F (36.5 C) (09/12 0956) Pulse Rate:  [65-73] 72 (09/12 1200) Cardiac Rhythm: Ventricular paced;Bundle branch block (09/12 0822) Resp:  [16-24] 16 (09/12 0956) BP: (94-134)/(47-81) 118/70 (09/12 1200) SpO2:  [91 %-100 %] 94 % (09/12 1200) Weight:  [114.7 kg (252 lb 14.4 oz)] 114.7 kg (252 lb 14.4 oz) (09/12 0322)  CBC:   Recent Labs Lab 10/22/15 2134  WBC 7.6  NEUTROABS 5.3  HGB 13.2  HCT 43.7  MCV 88.6  PLT 229    Basic Metabolic Panel:   Recent Labs Lab 10/22/15 2134  NA 140  K 4.3  CL 104  CO2 28  GLUCOSE 117*  BUN 27*  CREATININE 1.18*  CALCIUM 9.2    Lipid Panel:     Component Value Date/Time   CHOL 241 (H) 10/23/2015 0414   TRIG 172 (H) 10/23/2015 0414   HDL 40 (L) 10/23/2015 0414   CHOLHDL 6.0 10/23/2015 0414   VLDL 34 10/23/2015 0414   LDLCALC 167 (H) 10/23/2015 0414   HgbA1c:  Lab Results   Component Value Date   HGBA1C 6.7 (H) 11/23/2014   Urine Drug Screen: No results found for: LABOPIA, COCAINSCRNUR, LABBENZ, AMPHETMU, THCU, LABBARB    IMAGING I have personally reviewed the radiological images below and agree with the radiology interpretations.  Dg Chest 2 View 10/23/2015 No active cardiopulmonary disease.  Aortic atherosclerosis.   Ct Head Wo Contrast 10/22/2015 1. No acute intracranial findings. There is mild generalized atrophy and chronic appearing white matter hypodensities which likely represent small vessel ischemic disease. Remote left caudate lacunar infarction.   Ct Cervical Spine Wo Contrast 10/22/2015 No acute cervical spine findings. Prior C4-5 anterior interbody fusion. Moderate degenerative cervical disc disease from C4 through C7.  CT repeat pending  TCD pending  CUS pending  TTE pending  Pacer interrogation - formal report pending    PHYSICAL EXAM HEENT-  Normocephalic, no lesions, without obvious abnormality.  Normal external eye and conjunctiva.  Normal TM's bilaterally.  Normal auditory canals and external ears. Normal external nose, mucus membranes and septum.  Normal pharynx. Neck supple with no masses, nodes, nodules or enlargement. Cardiovascular - regular rate and rhythm, S1, S2 normal, no murmur, click, rub or gallop Lungs - chest clear, no wheezing, rales, normal symmetric air entry Abdomen - soft, non-tender; bowel sounds normal; no masses,  no organomegaly Extremities - no joint deformities, effusion, or inflammation  Neurologic Examination: Mental Status: Alert, oriented, thought  content appropriate. Morbid obesity. Speech fluent without evidence of aphasia. Able to follow commands without difficulty. Cranial Nerves: II-Visual fields were normal. III/IV/VI-Pupils were equal and reacted normally to light. Extraocular movements were full and conjugate.    V/VII-no facial numbness and no facial  weakness. VIII-normal. X-normal speech and symmetrical palatal movement. XI: trapezius strength/neck flexion strength normal bilaterally XII-midline tongue extension with normal strength. Motor: 5/5 bilaterally with normal tone and bulk Sensory: Normal throughout on tactile sensation. Deep Tendon Reflexes: 1+ and symmetric. Plantars: Mute bilaterally Cerebellar: Normal finger-to-nose testing. Carotid auscultation: Normal   ASSESSMENT/PLAN [name] is a 70 y.o. female with history of hypertension, hyperlipidemia, obstructive sleep apnea, obesity, coronary artery disease and implanted cardiac defibrillator, who presented to the ED at [facility] following an episode of transient weakness and numbness involving left arm and left leg that resolved after about 30 minutes. She did not receive IV t-PA due to resolution of deficits  Stroke vs. TIA:  Right brain TIA vs. Subcortical infarct, secondary to small vessel disease source due to multiple stroke risk factors including HTN, HLD, OSA, morbid obesity, DM, CAD s/p CABG  Resultant  Deficit resolved  MRI  implanted cardiac defibrillator  MRA  implanted cardiac defibrillator  CT - No acute intracranial findings. Remote left caudate lacunar infarction.   Repeat CT in am  TCD - pending  Carotid Doppler - pending  2D Echo pending  LDL - 167  HgbA1c pending  VTE prophylaxis - SCDs Diet heart healthy/carb modified Room service appropriate? Yes; Fluid consistency: Thin  aspirin 325 mg daily prior to admission, now on aspirin 325 mg daily. Recommend to switch to plavix for stroke prevention.  Patient counseled to be compliant with her antithrombotic medications  Ongoing aggressive stroke risk factor management  Therapy recommendations:  Home health physical therapy recommended. OT evaluation is pending.  Disposition: Pending  ? AFib/Aflutter  ICD interrogation showed 100% pacing  However, 10/18/15  interrogation showed 30 sec of afib/Aflutter  Will need clarify with cardiology  Hypertension  Blood pressure tends to run low  Permissive hypertension (OK if < 220/120) but gradually normalize in 5-7 days  Long-term BP goal normotensive  Hyperlipidemia  Home meds:  Pravachol 80 mg daily resumed in hospital  LDL 167, goal < 70  Changed to Lipitor 80 mg daily   Continue statin at discharge  Other Stroke Risk Factors  Advanced age  Obesity, Body mass index is 42.08 kg/m., recommend weight loss, diet and exercise as appropriate   Hx stroke/TIA by Ct scan  Coronary artery disease s/p CABG  Obstructive sleep apnea  Renal insufficiency - Cre 1.8   Hospital day # 0  Rosalin Hawking, MD PhD Stroke Neurology 10/23/2015 9:39 PM    To contact Stroke Continuity provider, please refer to [email or web address] After hours, contact General Neurology

## 2015-10-23 NOTE — Care Management Note (Signed)
Case Management Note  Patient Details  Name: [name] MRN: [id number] Date of Birth: March 20, 1945  Subjective/Objective:    Pt in with TIA. CTH negative and unable to have MRI d/t pacer. She is from home with family.                 Action/Plan: PT recommendations are for [facility] services. CM following for d/c needs.   Expected Discharge Date:                  Expected Discharge Plan:  [place]  In-House Referral:     Discharge planning Services     Post Acute Care Choice:    Choice offered to:     DME Arranged:    DME Agency:     HH Arranged:    [place] Agency:     Status of Service:  In process, will continue to follow  If discussed at Long Length of Stay Meetings, dates discussed:    Additional Comments:  Pollie Friar, RN 10/23/2015, 3:53 PM

## 2015-10-23 NOTE — Care Management Obs Status (Signed)
[place] NOTIFICATION   Patient Details  Name: [name] MRN: [id number] Date of Birth: 12-06-1945   Medicare Observation Status Notification Given:  Yes (Explained observation status, patient has no questions)    Apolonio Schneiders, RN 10/23/2015, 8:11 PM

## 2015-10-23 NOTE — Evaluation (Signed)
Physical Therapy Evaluation Patient Details Name: [name] MRN: [id number] DOB: 09-12-1945 Today's Date: 10/23/2015   History of Present Illness  Patient is a 70 y/o female with hx of cellulitis, obesity, ICD, HTN, DM, CAD and asthma presents with episode of transient weakness and numbness involving left arm and left leg. Head CT- unremarkable. Workup pending.  Clinical Impression  Patient presents with left sided weakness, dyspnea on exertion and balance deficits s/p above. Tolerated gait training with Min A for balance/safety. Would benefit from use of RW next session for stability. Pt has support at home from son. Pt just started back doing IADLs recently. Will follow acutely to maximize independence and mobility prior to return home.    Follow Up Recommendations Home health PT;Supervision for mobility/OOB    Equipment Recommendations  None recommended by PT    Recommendations for Other Services       Precautions / Restrictions Precautions Precautions: Fall Restrictions Weight Bearing Restrictions: No      Mobility  Bed Mobility               General bed mobility comments: Sitting in chair upon PT arrival.   Transfers Overall transfer level: Needs assistance Equipment used: None Transfers: Sit to/from Stand Sit to Stand: Min guard         General transfer comment: Min guard for safety. Some effort to stand.  Ambulation/Gait Ambulation/Gait assistance: Min assist Ambulation Distance (Feet): 150 Feet Assistive device: None Gait Pattern/deviations: Step-through pattern;Decreased stride length;Staggering right;Staggering left;Wide base of support Gait velocity: decreased Gait velocity interpretation: Below normal speed for age/gender General Gait Details: Slow, unsteady gait with staggering noted in both directions, using rail for support; 3 short standing rest breaks. DOE. Bil knee instability noted.   Stairs            Wheelchair Mobility     Modified Rankin (Stroke Patients Only) Modified Rankin (Stroke Patients Only) Pre-Morbid Rankin Score: Slight disability Modified Rankin: Moderately severe disability     Balance Overall balance assessment: Needs assistance Sitting-balance support: Feet supported;No upper extremity supported Sitting balance-Leahy Scale: Fair     Standing balance support: During functional activity Standing balance-Leahy Scale: Fair                               Pertinent Vitals/Pain Pain Assessment: No/denies pain    Home Living Family/patient expects to be discharged to:: Private residence Living Arrangements: Children Available Help at Discharge: Available PRN/intermittently;Family Type of Home: House Home Access: Stairs to enter Entrance Stairs-Rails: Psychiatric nurse of Steps: 3 Home Layout: Two level;Able to live on main level with bedroom/bathroom;One level Home Equipment: [place] - 2 wheels;Bedside commode      Prior Function Level of Independence: Independent         Comments: Just started being able to do dishes, cleaning again.     Hand Dominance   Dominant Hand: Right    Extremity/Trunk Assessment   Upper Extremity Assessment: Defer to OT evaluation (Decreased coordination LUE as demonstrated by decreased speed, accuracy during thumb to finger opposition testing. Decreased sensation hand.)           Lower Extremity Assessment: LLE deficits/detail;Generalized weakness   LLE Deficits / Details: Grossly ~4/5 throughout with giveaway weakness.     Communication   Communication: No difficulties  Cognition Arousal/Alertness: Awake/alert Behavior During Therapy: WFL for tasks assessed/performed Overall Cognitive Status: Within Functional Limits for tasks assessed  General Comments      Exercises        Assessment/Plan    PT Assessment Patient needs continued PT services  PT Diagnosis  Difficulty walking;Generalized weakness   PT Problem List Decreased strength;Decreased mobility;Obesity;Decreased coordination;Decreased balance;Decreased activity tolerance;Cardiopulmonary status limiting activity;Impaired sensation  PT Treatment Interventions DME instruction;Therapeutic activities;Gait training;Therapeutic exercise;Patient/family education;Balance training;Stair training;Functional mobility training;Neuromuscular re-education   PT Goals (Current goals can be found in the Care Plan section) Acute Rehab PT Goals Patient Stated Goal: to get back to normal PT Goal Formulation: With patient Time For Goal Achievement: 11/06/15 Potential to Achieve Goals: Good    Frequency Min 4X/week   Barriers to discharge Inaccessible home environment stairs to enter home    Co-evaluation               End of Session Equipment Utilized During Treatment: Gait belt Activity Tolerance: Patient tolerated treatment well;Patient limited by fatigue Patient left: in chair;with call bell/phone within reach Nurse Communication: Mobility status    Functional Assessment Tool Used: clinical judgment Functional Limitation: Mobility: Walking and moving around Mobility: Walking and Moving Around Current Status (X5284): At least 20 percent but less than 40 percent impaired, limited or restricted Mobility: Walking and Moving Around Goal Status [phone]): At least 1 percent but less than 20 percent impaired, limited or restricted    Time: 0906-0930 PT Time Calculation (min) (ACUTE ONLY): 24 min   Charges:   PT Evaluation $PT Eval Moderate Complexity: 1 Procedure PT Treatments $Gait Training: 8-22 mins   PT G Codes:   PT G-Codes **NOT FOR INPATIENT CLASS** Functional Assessment Tool Used: clinical judgment Functional Limitation: Mobility: Walking and moving around Mobility: Walking and Moving Around Current Status (W1027): At least 20 percent but less than 40 percent impaired, limited or  restricted Mobility: Walking and Moving Around Goal Status [phone]): At least 1 percent but less than 20 percent impaired, limited or restricted    [place] 10/23/2015, 9:58 AM Wray Kearns, PT, DPT [phone]

## 2015-10-23 NOTE — Progress Notes (Signed)
Speech Language Pathology  Patient Details Name: [name] MRN: [id number] DOB: Oct 27, 1945 Today's Date: 10/23/2015 Time: 4835-0757 SLP Time Calculation (min) (ACUTE ONLY): 15 min     Pt is well known to me from outside the hospital (former co-worker/friend) and speech-language-cognition are WNL's and no need for formal assessment.     Orbie Pyo [place] [phone]

## 2015-10-23 NOTE — Consult Note (Signed)
Admission H&P    Chief Complaint: Acute onset of weakness and numbness involving left extremities.  HPI: [name] is an 70 y.o. female history of hypertension, hyperlipidemia, obstructive sleep apnea, obesity, coronary artery disease and implanted cardiac defibrillator, who presented to the ED at [facility] following an episode of transient weakness and numbness involving left arm and left leg. She was last known well at 11 AM on 10/22/2015. She has no history of stroke nor TIA. She's been taking aspirin daily. Deficits resolved after about 30 minutes and have not recurred. CT scan of her head showed no acute intracranial abnormality. NIH stroke score at the time of this evaluation was 0.  LSN: 11:00 AM on 10/22/2015 tPA Given: No: Deficits rapidly resolved mRankin:  Past Medical History:  Diagnosis Date  . Anemia   . Arthritis   . Asthma   . CAD (coronary artery disease)   . Cellulitis and abscess of foot 12/2014   RT FOOT  . Diabetes mellitus without complication ([place])   . Diverticulosis   . Fatty liver disease, nonalcoholic   . Gastritis   . GERD (gastroesophageal reflux disease)   . H/O hiatal hernia   . Hyperplastic colon polyp   . Hypertension   . IBS (irritable bowel syndrome)   . ICD (implantable cardiac defibrillator) in place   . Internal hemorrhoids   . Obesity   . OSA (obstructive sleep apnea)   . Shortness of breath     Past Surgical History:  Procedure Laterality Date  . ABDOMINAL ULTRASOUND  12/01/2011   Peripelvic cysts- #1- 2.4x1.9x2.3cm, #2-1.2x0.9x1.2cm  . ANKLE SURGERY     right  . APPENDECTOMY    . BIV PACEMAKER GENERATOR CHANGE OUT N/A 11/02/2012   Procedure: BIV PACEMAKER GENERATOR CHANGE OUT;  Surgeon: Sanda Klein, MD;  Location: [place] CATH LAB;  Service: Cardiovascular;  Laterality: N/A;  . CARDIAC CATHETERIZATION  05/17/1999   No significant coronary obstructive disease w/ mild 20% luminal irregularity of the first diag branch of  the LAD  . CARDIAC CATHETERIZATION  07/08/2002   No significant CAD, moderately depressed LV systolic function  . CARDIAC CATHETERIZATION Bilateral 04/26/2007   Normal findings, recommend medical therapy  . CARDIAC CATHETERIZATION  02/18/2008   Moderate CAD, would benefit from having a functional study, recommend continue medical therapy  . CARDIAC CATHETERIZATION  07/23/2012   Medical therapy  . CARDIAC CATHETERIZATION N/A 11/24/2014   Procedure: Left Heart Cath and Coronary Angiography;  Surgeon: Troy Sine, MD;  Location: [place] CV LAB;  Service: Cardiovascular;  Laterality: N/A;  . CARDIAC CATHETERIZATION  11/27/2014   Procedure: Intravascular Pressure Wire/FFR Study;  Surgeon: Peter M Martinique, MD;  Location: [place] CV LAB;  Service: Cardiovascular;;  . CARDIAC DEFIBRILLATOR PLACEMENT    . CERVICAL SPINE SURGERY     plate   . CORONARY ANGIOGRAM  2010  . CORONARY ARTERY BYPASS GRAFT N/A 11/29/2014   Procedure: CORONARY ARTERY BYPASS GRAFTING x 5 (LIMA-LAD, SVG-D, SVG-OM1-OM2, SVG-PD);  Surgeon: Melrose Nakayama, MD;  Location: [place];  Service: Open Heart Surgery;  Laterality: N/A;  . KNEE SURGERY     right  . LEFT HEART CATHETERIZATION WITH CORONARY ANGIOGRAM N/A 07/23/2012   Procedure: LEFT HEART CATHETERIZATION WITH CORONARY ANGIOGRAM;  Surgeon: Leonie Man, MD;  Location: [facility] CATH LAB;  Service: Cardiovascular;  Laterality: N/A;  Carlton Adam MYOVIEW  11/14/2011   Mild-moderate defect seen in Mid Inferolateral and Mid Anterolateral regions-consistant w/ infarct/scar. No  significant ischemia demonstrated.  [name] PACEMAKER INSERTION  2001   BS BivICD  . TEE WITHOUT CARDIOVERSION N/A 11/29/2014   Procedure: TRANSESOPHAGEAL ECHOCARDIOGRAM (TEE);  Surgeon: Melrose Nakayama, MD;  Location: [place];  Service: Open Heart Surgery;  Laterality: N/A;  . TRANSTHORACIC ECHOCARDIOGRAM  07/23/2012   EF 55-60%, normal-mild  . TUBAL LIGATION      Family History  Problem Relation Age  of Onset  . Breast cancer Mother   . Diabetes Mother   . Heart disease Maternal Grandmother   . Kidney disease Maternal Grandmother   . Diabetes Maternal Grandmother   . Glaucoma Maternal Aunt   . Heart disease Maternal Aunt   . Asthma Sister    Social History:  reports that she quit smoking about 47 years ago. Her smoking use included Cigarettes. She has a 0.75 pack-year smoking history. She has never used smokeless tobacco. She reports that she does not drink alcohol or use drugs.  Allergies:  Allergies  Allergen Reactions  . Ivp Dye [Iodinated Diagnostic Agents] Shortness Of Breath    No reaction to PO contrast with non-ionic dye.06-25-2014/rsm  . Shellfish-Derived Products Anaphylaxis  . Sulfa Antibiotics Shortness Of Breath  . Iodine Hives  . Cephalexin Itching and Rash    Rash & itch after Keflex  . Zithromax [Azithromycin] Rash    Medications Prior to Admission  Medication Sig Dispense Refill  . acetaminophen (TYLENOL) 500 MG tablet Take 1,000 mg by mouth every 6 (six) hours as needed (for pain.).    [name] albuterol (PROVENTIL HFA;VENTOLIN HFA) 108 (90 BASE) MCG/ACT inhaler Inhale 2 puffs into the lungs every 4 (four) hours as needed for wheezing (wheezing).     [name] aspirin EC 325 MG EC tablet Take 1 tablet (325 mg total) by mouth daily. 30 tablet 0  . carvedilol (COREG) 6.25 MG tablet TAKE ONE TABLET BY MOUTH TWICE DAILY WITH  A  MEAL 60 tablet 2  . cetirizine (ZYRTEC) 10 MG tablet Take 10 mg by mouth daily.     . clotrimazole (MYCELEX) 10 MG troche Take 10 mg by mouth every 4 (four) hours as needed (mouth pain).    [name] EPINEPHrine 0.3 mg/0.3 mL IJ SOAJ injection     . fluorometholone (FML) 0.1 % ophthalmic suspension Place 1 drop into both eyes 2 (two) times daily.    . fluticasone (FLONASE) 50 MCG/ACT nasal spray Place 1 spray into the nose 2 (two) times daily as needed for rhinitis or allergies.     . isosorbide mononitrate (IMDUR) 30 MG 24 hr tablet Take 1 tablet (30 mg total)  by mouth daily. 30 tablet 0  . KLOR-CON M20 20 MEQ tablet TAKE ONE TABLET BY MOUTH ONCE DAILY (Patient taking differently: TAKE 20 MEQ BY MOUTH ONCE DAILY) 60 tablet 4  . omeprazole (PRILOSEC) 40 MG capsule Take 1 capsule (40 mg total) by mouth 2 (two) times daily before a meal. 60 capsule 11  . pravastatin (PRAVACHOL) 80 MG tablet Take 1 tablet (80 mg total) by mouth every evening. 90 tablet 2  . spironolactone (ALDACTONE) 25 MG tablet Take 25 mg by mouth daily.    . SYMBICORT 160-4.5 MCG/ACT inhaler Inhale 2 puffs into the lungs 2 (two) times daily.    [name] torsemide (DEMADEX) 20 MG tablet Take 40 mg by mouth 2 (two) times daily.    [name] triamcinolone cream (KENALOG) 0.1 % Apply 1 application topically daily as needed (applied to affected itchy area(s)).     [name]  spironolactone (ALDACTONE) 25 MG tablet TAKE ONE-HALF TABLET BY MOUTH TWICE DAILY (Patient not taking: Reported on 10/23/2015) 30 tablet 2    ROS: History obtained from the patient  General ROS: negative for - chills, fatigue, fever, night sweats, weight gain or weight loss Psychological ROS: negative for - behavioral disorder, hallucinations, memory difficulties, mood swings or suicidal ideation Ophthalmic ROS: negative for - blurry vision, double vision, eye pain or loss of vision ENT ROS: negative for - epistaxis, nasal discharge, oral lesions, sore throat, tinnitus or vertigo Allergy and Immunology ROS: negative for - hives or itchy/watery eyes Hematological and Lymphatic ROS: negative for - bleeding problems, bruising or swollen lymph nodes Endocrine ROS: negative for - galactorrhea, hair pattern changes, polydipsia/polyuria or temperature intolerance Respiratory ROS: negative for - cough, hemoptysis, shortness of breath or wheezing Cardiovascular ROS: Recent chest pain for which she was given long-acting nitroglycerin Gastrointestinal ROS: negative for - abdominal pain, diarrhea, hematemesis, nausea/vomiting or stool  incontinence Genito-Urinary ROS: negative for - dysuria, hematuria, incontinence or urinary frequency/urgency Musculoskeletal ROS: negative for - joint swelling or muscular weakness Neurological ROS: as noted in HPI Dermatological ROS: negative for rash and skin lesion changes  Physical Examination: Blood pressure 110/63, pulse 71, temperature 97.5 F (36.4 C), temperature source Oral, resp. rate 20, height _0  (1.651 m), weight 114.7 kg (252 lb 14.4 oz), SpO2 95 %.  HEENT-  Normocephalic, no lesions, without obvious abnormality.  Normal external eye and conjunctiva.  Normal TM's bilaterally.  Normal auditory canals and external ears. Normal external nose, mucus membranes and septum.  Normal pharynx. Neck supple with no masses, nodes, nodules or enlargement. Cardiovascular - regular rate and rhythm, S1, S2 normal, no murmur, click, rub or gallop Lungs - chest clear, no wheezing, rales, normal symmetric air entry Abdomen - soft, non-tender; bowel sounds normal; no masses,  no organomegaly Extremities - no joint deformities, effusion, or inflammation  Neurologic Examination: Mental Status: Alert, oriented, thought content appropriate.  Speech fluent without evidence of aphasia. Able to follow commands without difficulty. Cranial Nerves: II-Visual fields were normal. III/IV/VI-Pupils were equal and reacted normally to light. Extraocular movements were full and conjugate.    V/VII-no facial numbness and no facial weakness. VIII-normal. X-normal speech and symmetrical palatal movement. XI: trapezius strength/neck flexion strength normal bilaterally XII-midline tongue extension with normal strength. Motor: 5/5 bilaterally with normal tone and bulk Sensory: Normal throughout. Deep Tendon Reflexes: 1+ and symmetric. Plantars: Mute bilaterally Cerebellar: Normal finger-to-nose testing. Carotid auscultation: Normal  Results for orders placed or performed during the hospital encounter of  10/22/15 (from the past 48 hour(s))  CBC with Differential     Status: None   Collection Time: 10/22/15  9:34 PM  Result Value Ref Range   WBC 7.6 4.0 - 10.5 K/uL   RBC 4.93 3.87 - 5.11 MIL/uL   Hemoglobin 13.2 12.0 - 15.0 g/dL   HCT 43.7 36.0 - 46.0 %   MCV 88.6 78.0 - 100.0 fL   MCH 26.8 26.0 - 34.0 pg   MCHC 30.2 30.0 - 36.0 g/dL   RDW 15.1 11.5 - 15.5 %   Platelets 156 150 - 400 K/uL   Neutrophils Relative % 69 %   Neutro Abs 5.3 1.7 - 7.7 K/uL   Lymphocytes Relative 23 %   Lymphs Abs 1.7 0.7 - 4.0 K/uL   Monocytes Relative 5 %   Monocytes Absolute 0.4 0.1 - 1.0 K/uL   Eosinophils Relative 2 %   Eosinophils Absolute 0.2 0.0 -  0.7 K/uL   Basophils Relative 1 %   Basophils Absolute 0.0 0.0 - 0.1 K/uL  Comprehensive metabolic panel     Status: Abnormal   Collection Time: 10/22/15  9:34 PM  Result Value Ref Range   Sodium 140 135 - 145 mmol/L   Potassium 4.3 3.5 - 5.1 mmol/L   Chloride 104 101 - 111 mmol/L   CO2 28 22 - 32 mmol/L   Glucose, Bld 117 (H) 65 - 99 mg/dL   BUN 27 (H) 6 - 20 mg/dL   Creatinine, Ser 1.18 (H) 0.44 - 1.00 mg/dL   Calcium 9.2 8.9 - 10.3 mg/dL   Total Protein 7.1 6.5 - 8.1 g/dL   Albumin 3.5 3.5 - 5.0 g/dL   AST 17 15 - 41 U/L   ALT 22 14 - 54 U/L   Alkaline Phosphatase 97 38 - 126 U/L   Total Bilirubin 0.4 0.3 - 1.2 mg/dL   GFR calc non Af Amer 45 (L) >60 mL/min   GFR calc Af Amer 52 (L) >60 mL/min    Comment: (NOTE) The eGFR has been calculated using the CKD EPI equation. This calculation has not been validated in all clinical situations. eGFR's persistently <60 mL/min signify possible Chronic Kidney Disease.    Anion gap 8 5 - 15  Urinalysis, Routine w reflex microscopic (not at [facility])     Status: Abnormal   Collection Time: 10/22/15 11:53 PM  Result Value Ref Range   Color, Urine YELLOW YELLOW   APPearance CLOUDY (A) CLEAR   Specific Gravity, Urine 1.026 1.005 - 1.030   pH 5.5 5.0 - 8.0   Glucose, UA NEGATIVE NEGATIVE mg/dL   Hgb  urine dipstick NEGATIVE NEGATIVE   Bilirubin Urine NEGATIVE NEGATIVE   Ketones, ur NEGATIVE NEGATIVE mg/dL   Protein, ur NEGATIVE NEGATIVE mg/dL   Nitrite NEGATIVE NEGATIVE   Leukocytes, UA SMALL (A) NEGATIVE  Urine microscopic-add on     Status: Abnormal   Collection Time: 10/22/15 11:53 PM  Result Value Ref Range   Squamous Epithelial / LPF 0-5 (A) NONE SEEN   WBC, UA 6-30 0 - 5 WBC/hpf   RBC / HPF NONE SEEN 0 - 5 RBC/hpf   Bacteria, UA FEW (A) NONE SEEN  D-dimer, quantitative (not at [facility])     Status: Abnormal   Collection Time: 10/23/15 12:19 AM  Result Value Ref Range   D-Dimer, Quant 1.56 (H) 0.00 - 0.50 ug/mL-FEU    Comment: (NOTE) At the manufacturer cut-off of 0.50 ug/mL FEU, this assay has been documented to exclude PE with a sensitivity and negative predictive value of 97 to 99%.  At this time, this assay has not been approved by the FDA to exclude DVT/VTE. Results should be correlated with clinical presentation.   Troponin I (q 6hr x 3)     Status: None   Collection Time: 10/23/15  4:14 AM  Result Value Ref Range   Troponin I <0.03 <0.03 ng/mL  Lipid panel     Status: Abnormal   Collection Time: 10/23/15  4:14 AM  Result Value Ref Range   Cholesterol 241 (H) 0 - 200 mg/dL   Triglycerides 172 (H) <150 mg/dL   HDL 40 (L) >40 mg/dL   Total CHOL/HDL Ratio 6.0 RATIO   VLDL 34 0 - 40 mg/dL   LDL Cholesterol 167 (H) 0 - 99 mg/dL    Comment:        Total Cholesterol/HDL:CHD Risk Coronary Heart Disease Risk Table  Men   Women  1/2 Average Risk   3.4   3.3  Average Risk       5.0   4.4  2 X Average Risk   9.6   7.1  3 X Average Risk  23.4   11.0        Use the calculated Patient Ratio above and the CHD Risk Table to determine the patient's CHD Risk.        ATP III CLASSIFICATION (LDL):  <100     mg/dL   Optimal  100-129  mg/dL   Near or Above                    Optimal  130-159  mg/dL   Borderline  160-189  mg/dL   High  >190     mg/dL    Very High    Dg Chest 2 View  Result Date: 10/23/2015 CLINICAL DATA:  Midchest pain for 1 week.  Shortness of breath. EXAM: CHEST  2 VIEW COMPARISON:  Chest radiograph 06/20/2015 FINDINGS: Right chest wall AICD leads are unchanged in position. Cardiomediastinal contours are unchanged. No focal airspace consolidation or pulmonary edema. No pneumothorax or sizable pleural effusion. CABG markers and median sternotomy wires are unchanged. IMPRESSION: No active cardiopulmonary disease.  Aortic atherosclerosis. Electronically Signed   By: Ulyses Jarred M.D.   On: 10/23/2015 00:58   Ct Head Wo Contrast  Result Date: 10/22/2015 CLINICAL DATA:  Left arm and leg paresthesias, onset at 11:00. Partial resolution since that time. EXAM: CT HEAD WITHOUT CONTRAST CT CERVICAL SPINE WITHOUT CONTRAST TECHNIQUE: Multidetector CT imaging of the head and cervical spine was performed following the standard protocol without intravenous contrast. Multiplanar CT image reconstructions of the cervical spine were also generated. COMPARISON:  None. FINDINGS: CT HEAD FINDINGS Brain: There is no intracranial hemorrhage, mass or evidence of acute infarction. Remote lacunar infarction in the left caudate. There is mild generalized atrophy. There is mild chronic microvascular ischemic change. There is no significant extra-axial fluid collection. No acute intracranial findings are evident. Vascular: No hyperdense vessel or unexpected calcification. Skull: Normal. Negative for fracture or focal lesion. Sinuses/Orbits: No acute finding. CT CERVICAL SPINE FINDINGS Alignment: Mild straightening of cervical lordosis. Prior anterior interbody fusion with plate screw fixation hardware at C4-5. Skull base and vertebrae: No acute fracture. No primary bone lesion or focal pathologic process. Soft tissues and spinal canal: No prevertebral fluid or swelling. No visible canal hematoma. Disc levels: Moderate degenerative disc changes from C4 through C7.  Prominent posterior osteophytes at C5-6 and C6-7 likely cause mild degenerative central canal stenosis. Upper chest: No significant abnormality Other: IMPRESSION: 1. No acute intracranial findings. There is mild generalized atrophy and chronic appearing white matter hypodensities which likely represent small vessel ischemic disease. Remote left caudate lacunar infarction. 2. No acute cervical spine findings. Prior C4-5 anterior interbody fusion. Moderate degenerative cervical disc disease from C4 through C7. Electronically Signed   By: Andreas Newport M.D.   On: 10/22/2015 22:49   Ct Cervical Spine Wo Contrast  Result Date: 10/22/2015 CLINICAL DATA:  Left arm and leg paresthesias, onset at 11:00. Partial resolution since that time. EXAM: CT HEAD WITHOUT CONTRAST CT CERVICAL SPINE WITHOUT CONTRAST TECHNIQUE: Multidetector CT imaging of the head and cervical spine was performed following the standard protocol without intravenous contrast. Multiplanar CT image reconstructions of the cervical spine were also generated. COMPARISON:  None. FINDINGS: CT HEAD FINDINGS Brain: There is no intracranial hemorrhage, mass or  evidence of acute infarction. Remote lacunar infarction in the left caudate. There is mild generalized atrophy. There is mild chronic microvascular ischemic change. There is no significant extra-axial fluid collection. No acute intracranial findings are evident. Vascular: No hyperdense vessel or unexpected calcification. Skull: Normal. Negative for fracture or focal lesion. Sinuses/Orbits: No acute finding. CT CERVICAL SPINE FINDINGS Alignment: Mild straightening of cervical lordosis. Prior anterior interbody fusion with plate screw fixation hardware at C4-5. Skull base and vertebrae: No acute fracture. No primary bone lesion or focal pathologic process. Soft tissues and spinal canal: No prevertebral fluid or swelling. No visible canal hematoma. Disc levels: Moderate degenerative disc changes from C4  through C7. Prominent posterior osteophytes at C5-6 and C6-7 likely cause mild degenerative central canal stenosis. Upper chest: No significant abnormality Other: IMPRESSION: 1. No acute intracranial findings. There is mild generalized atrophy and chronic appearing white matter hypodensities which likely represent small vessel ischemic disease. Remote left caudate lacunar infarction. 2. No acute cervical spine findings. Prior C4-5 anterior interbody fusion. Moderate degenerative cervical disc disease from C4 through C7. Electronically Signed   By: Andreas Newport M.D.   On: 10/22/2015 22:49    Assessment: 70 y.o. female with multiple risk factors for stroke presenting with probable right subcortical MCA territory transient ischemic attack. Small vessel cerebral infarction cannot be ruled out at this point, however.  Stroke Risk Factors - hyperlipidemia and hypertension  Plan: 1. HgbA1c, fasting lipid panel 2. CT angiogram with contrast of head and neck 3. PT consult, OT consult, Speech consult 4. Echocardiogram 5. Prophylactic therapy-Antiplatelet med: Aspirin  6. Risk factor modification 7. Telemetry monitoring  C.R. Nicole Kindred, MD [facility] [phone]  10/23/2015, 5:47 AM

## 2015-10-23 NOTE — Evaluation (Signed)
Clinical/Bedside Swallow Evaluation Patient Details  Name: [name] MRN: [id number] Date of Birth: August 18, 1945  Today's Date: 10/23/2015 Time: SLP Start Time (ACUTE ONLY): 2229 SLP Stop Time (ACUTE ONLY): 0852 SLP Time Calculation (min) (ACUTE ONLY): 15 min  Past Medical History:  Past Medical History:  Diagnosis Date  . Anemia   . Arthritis   . Asthma   . CAD (coronary artery disease)   . Cellulitis and abscess of foot 12/2014   RT FOOT  . Diabetes mellitus without complication ([place])   . Diverticulosis   . Fatty liver disease, nonalcoholic   . Gastritis   . GERD (gastroesophageal reflux disease)   . H/O hiatal hernia   . Hyperplastic colon polyp   . Hypertension   . IBS (irritable bowel syndrome)   . ICD (implantable cardiac defibrillator) in place   . Internal hemorrhoids   . Obesity   . OSA (obstructive sleep apnea)   . Shortness of breath    Past Surgical History:  Past Surgical History:  Procedure Laterality Date  . ABDOMINAL ULTRASOUND  12/01/2011   Peripelvic cysts- #1- 2.4x1.9x2.3cm, #2-1.2x0.9x1.2cm  . ANKLE SURGERY     right  . APPENDECTOMY    . BIV PACEMAKER GENERATOR CHANGE OUT N/A 11/02/2012   Procedure: BIV PACEMAKER GENERATOR CHANGE OUT;  Surgeon: Sanda Klein, MD;  Location: [facility] CATH [facility];  Service: Cardiovascular;  Laterality: N/A;  . CARDIAC CATHETERIZATION  05/17/1999   No significant coronary obstructive disease w/ mild 20% luminal irregularity of the first diag branch of the LAD  . CARDIAC CATHETERIZATION  07/08/2002   No significant CAD, moderately depressed LV systolic function  . CARDIAC CATHETERIZATION Bilateral 04/26/2007   Normal findings, recommend medical therapy  . CARDIAC CATHETERIZATION  02/18/2008   Moderate CAD, would benefit from having a functional study, recommend continue medical therapy  . CARDIAC CATHETERIZATION  07/23/2012   Medical therapy  . CARDIAC CATHETERIZATION N/A 11/24/2014   Procedure: Left Heart Cath and Coronary  Angiography;  Surgeon: Troy Sine, MD;  Location: [facility] CV [facility];  Service: Cardiovascular;  Laterality: N/A;  . CARDIAC CATHETERIZATION  11/27/2014   Procedure: Intravascular Pressure Wire/FFR Study;  Surgeon: Peter M Martinique, MD;  Location: [facility] CV [facility];  Service: Cardiovascular;;  . CARDIAC DEFIBRILLATOR PLACEMENT    . CERVICAL SPINE SURGERY     plate   . CORONARY ANGIOGRAM  2010  . CORONARY ARTERY BYPASS GRAFT N/A 11/29/2014   Procedure: CORONARY ARTERY BYPASS GRAFTING x 5 (LIMA-LAD, SVG-D, SVG-OM1-OM2, SVG-PD);  Surgeon: Melrose Nakayama, MD;  Location: [facility];  Service: Open Heart Surgery;  Laterality: N/A;  . KNEE SURGERY     right  . LEFT HEART CATHETERIZATION WITH CORONARY ANGIOGRAM N/A 07/23/2012   Procedure: LEFT HEART CATHETERIZATION WITH CORONARY ANGIOGRAM;  Surgeon: Leonie Man, MD;  Location: [facility] C[facility];  Service: Cardiovascular;  Laterality: N/A;  Carlton Adam MYOVIEW  11/14/2011   Mild-moderate defect seen in Mid Inferolateral and Mid Anterolateral regions-consistant w/ infarct/scar. No significant ischemia demonstrated.  [name] PACEMAKER INSERTION  2001   BS BivICD  . TEE WITHOUT CARDIOVERSION N/A 11/29/2014   Procedure: TRANSESOPHAGEAL ECHOCARDIOGRAM (TEE);  Surgeon: Melrose Nakayama, MD;  Location: [facility];  Service: Open Heart Surgery;  Laterality: N/A;  . TRANSTHORACIC ECHOCARDIOGRAM  07/23/2012   EF 55-60%, normal-mild  . TUBAL LIGATION     HPI:  [name] a 70 y.o.femalewith medical history significant of CAD s/p CABG), GERD, OSA, asthma,  diastolic  heart failure, morbid obesity, adrenal insufficiency, type 2 diabetes mellitus, Hypertension, stage III chronic kidney disease. Pt presented with sudden moderate left arm and leg numbness. CT head no acute findings, remote left caudatelacunar infarction. Prior C4-5 anterior interbody fusion. Moderate degenerative cervical disc disease from C4 through C7.   Assessment / Plan /  Recommendation Clinical Impression  Oral and pharyngeal swallow abilities are WNL's. No indications of aspiration; pt denies previous difficulty and no recent pna. CVA increases risk. SLP edcuated re: general swallow precautions. Recommend regular texture and thin liquids, pills with water, straws allowed. No f/u needed.    Aspiration Risk  Mild aspiration risk    Diet Recommendation Regular;Thin liquid   Liquid Administration via: Straw;Cup Medication Administration: Whole meds with liquid Supervision: Patient able to self feed Compensations: Slow rate;Small sips/bites Postural Changes: Seated upright at 90 degrees;Remain upright for at least 30 minutes after po intake    Other  Recommendations Oral Care Recommendations: Oral care BID   Follow up Recommendations  None    Frequency and Duration            Prognosis        Swallow Study   General HPI: [name] a 69 y.o.femalewith medical history significant of CAD s/p CABG), GERD, OSA, asthma,  diastolic heart failure, morbid obesity, adrenal insufficiency, type 2 diabetes mellitus, Hypertension, stage III chronic kidney disease. Pt presented with sudden moderate left arm and leg numbness. CT head no acute findings, remote left caudatelacunar infarction. Prior C4-5 anterior interbody fusion. Moderate degenerative cervical disc disease from C4 through C7. Type of Study: Bedside Swallow Evaluation Previous Swallow Assessment:  (none) Diet Prior to this Study: NPO Temperature Spikes Noted: No Respiratory Status: Room air History of Recent Intubation: No Behavior/Cognition: Alert;Cooperative;Pleasant mood Oral Cavity Assessment: Within Functional Limits Oral Care Completed by SLP: No Oral Cavity - Dentition:  (natural upper, no lower) Vision: Functional for self-feeding Self-Feeding Abilities: Able to feed self Patient Positioning: Upright in chair Baseline Vocal Quality: Normal Volitional Cough:  Strong Volitional Swallow: Able to elicit    Oral/Motor/Sensory Function Overall Oral Motor/Sensory Function: Within functional limits   Ice Chips Ice chips: Not tested   Thin Liquid Thin Liquid: Within functional limits Presentation: Cup;Straw    Nectar Thick Nectar Thick Liquid: Not tested   Honey Thick Honey Thick Liquid: Not tested   Puree Puree: Within functional limits   Solid   GO   Solid: Within functional limits    Functional Assessment Tool Used: skilled clinical judgement Functional Limitations: Swallowing Swallow Current Status (W4132): 0 percent impaired, limited or restricted Swallow Goal Status (G4010): 0 percent impaired, limited or restricted Swallow Discharge Status [phone]): 0 percent impaired, limited or restricted   Houston Siren 10/23/2015,9:15 AM  Orbie Pyo Colvin Caroli.Ed [employer] [phone]

## 2015-10-23 NOTE — Progress Notes (Addendum)
Device check per Trish.  BiV pacemaker.  Normal device function.  No episodes.  RV:  Paced, 838, 0.8V@0 .57ms LV:   Paced, 899, @0 .2ms (I stopped test early due to dependency)  100% BiV paced.  Mode:  VDD 7 / [place] [phone]  (Noted charted under supervision of J. Devun Anna, RN as device tech does not have EPIC access.)

## 2015-10-24 ENCOUNTER — Encounter: Payer: Self-pay | Admitting: Cardiovascular Disease

## 2015-10-24 ENCOUNTER — Observation Stay (HOSPITAL_BASED_OUTPATIENT_CLINIC_OR_DEPARTMENT_OTHER): Payer: Commercial Managed Care - HMO

## 2015-10-24 ENCOUNTER — Observation Stay (HOSPITAL_COMMUNITY): Payer: Commercial Managed Care - HMO

## 2015-10-24 DIAGNOSIS — I4891 Unspecified atrial fibrillation: Secondary | ICD-10-CM

## 2015-10-24 DIAGNOSIS — I480 Paroxysmal atrial fibrillation: Secondary | ICD-10-CM | POA: Diagnosis present

## 2015-10-24 DIAGNOSIS — R202 Paresthesia of skin: Secondary | ICD-10-CM | POA: Diagnosis not present

## 2015-10-24 DIAGNOSIS — I63311 Cerebral infarction due to thrombosis of right middle cerebral artery: Secondary | ICD-10-CM

## 2015-10-24 DIAGNOSIS — I6789 Other cerebrovascular disease: Secondary | ICD-10-CM

## 2015-10-24 DIAGNOSIS — I442 Atrioventricular block, complete: Secondary | ICD-10-CM | POA: Diagnosis not present

## 2015-10-24 DIAGNOSIS — J4521 Mild intermittent asthma with (acute) exacerbation: Secondary | ICD-10-CM | POA: Diagnosis not present

## 2015-10-24 DIAGNOSIS — R269 Unspecified abnormalities of gait and mobility: Secondary | ICD-10-CM | POA: Diagnosis not present

## 2015-10-24 DIAGNOSIS — G459 Transient cerebral ischemic attack, unspecified: Secondary | ICD-10-CM

## 2015-10-24 DIAGNOSIS — G458 Other transient cerebral ischemic attacks and related syndromes: Secondary | ICD-10-CM | POA: Diagnosis not present

## 2015-10-24 DIAGNOSIS — J4520 Mild intermittent asthma, uncomplicated: Secondary | ICD-10-CM | POA: Diagnosis not present

## 2015-10-24 DIAGNOSIS — I6330 Cerebral infarction due to thrombosis of unspecified cerebral artery: Secondary | ICD-10-CM

## 2015-10-24 DIAGNOSIS — I5032 Chronic diastolic (congestive) heart failure: Secondary | ICD-10-CM | POA: Diagnosis not present

## 2015-10-24 DIAGNOSIS — I10 Essential (primary) hypertension: Secondary | ICD-10-CM | POA: Diagnosis not present

## 2015-10-24 HISTORY — DX: Unspecified atrial fibrillation: I48.91

## 2015-10-24 LAB — HEMOGLOBIN A1C
Hgb A1c MFr Bld: 6.5 % — ABNORMAL HIGH (ref 4.8–5.6)
Mean Plasma Glucose: 140 mg/dL

## 2015-10-24 LAB — URINE CULTURE

## 2015-10-24 LAB — ECHOCARDIOGRAM COMPLETE
Height: 65 in
Weight: 4046.4 oz

## 2015-10-24 MED ORDER — PREDNISONE 20 MG PO TABS: 40.0000 mg | ORAL_TABLET | Freq: Every day | ORAL | 0 refills | Status: AC

## 2015-10-24 MED ORDER — CLOPIDOGREL BISULFATE 75 MG PO TABS: 75.0000 mg | ORAL_TABLET | Freq: Every day | ORAL | 0 refills | Status: DC

## 2015-10-24 MED ORDER — DOXYCYCLINE MONOHYDRATE 100 MG PO CAPS: 100.0000 mg | ORAL_CAPSULE | Freq: Two times a day (BID) | ORAL | 0 refills | Status: AC

## 2015-10-24 MED ORDER — DOXYCYCLINE HYCLATE 100 MG PO TABS
100.0000 mg | ORAL_TABLET | Freq: Two times a day (BID) | ORAL | Status: DC
  Administered 2015-10-24: 100 mg via ORAL
  Filled 2015-10-24: qty 1

## 2015-10-24 MED ORDER — PERFLUTREN LIPID MICROSPHERE
1.0000 mL | INTRAVENOUS | Status: AC | PRN
  Administered 2015-10-24: 3 mL via INTRAVENOUS
  Filled 2015-10-24: qty 10

## 2015-10-24 MED ORDER — ATORVASTATIN CALCIUM 80 MG PO TABS: 80.0000 mg | ORAL_TABLET | Freq: Every day | ORAL | 0 refills | Status: DC

## 2015-10-24 NOTE — Progress Notes (Signed)
STROKE TEAM PROGRESS NOTE   HISTORY OF PRESENT ILLNESS (per record) [name] is an 70 y.o. female history of hypertension, hyperlipidemia, obstructive sleep apnea, obesity, coronary artery disease and implanted cardiac defibrillator, who presented to the ED at [facility] following an episode of transient weakness and numbness involving left arm and left leg. She was last known well at 11 AM on 10/22/2015. She has no history of stroke nor TIA. She's been taking aspirin daily. Deficits resolved after about 30 minutes and have not recurred. CT scan of her head showed no acute intracranial abnormality. NIH stroke score at the time of this evaluation was 0.  LSN: 11:00 AM on 10/22/2015 tPA Given: No: Deficits rapidly resolved mRankin:   SUBJECTIVE (INTERVAL HISTORY) NO family is at the bedside.  Overall she feels her condition is rapidly improving, near baseline now. Still feel left arm tingling and left leg dragging on walking with PT. Pacer interrogation showed 100% pacing.    OBJECTIVE Temp:  [97 F (36.1 C)-98.5 F (36.9 C)] 98.5 F (36.9 C) (09/13 1356) Pulse Rate:  [70-79] 78 (09/13 1356) Cardiac Rhythm: Ventricular paced (09/13 0910) Resp:  [16-20] 19 (09/13 1356) BP: (127-144)/(55-72) 127/72 (09/13 1356) SpO2:  [92 %-98 %] 92 % (09/13 1356)  CBC:   Recent Labs Lab 10/22/15 2134  WBC 7.6  NEUTROABS 5.3  HGB 13.2  HCT 43.7  MCV 88.6  PLT 580    Basic Metabolic Panel:   Recent Labs Lab 10/22/15 2134  NA 140  K 4.3  CL 104  CO2 28  GLUCOSE 117*  BUN 27*  CREATININE 1.18*  CALCIUM 9.2    Lipid Panel:     Component Value Date/Time   CHOL 241 (H) 10/23/2015 0414   TRIG 172 (H) 10/23/2015 0414   HDL 40 (L) 10/23/2015 0414   CHOLHDL 6.0 10/23/2015 0414   VLDL 34 10/23/2015 0414   LDLCALC 167 (H) 10/23/2015 0414   HgbA1c:  Lab Results  Component Value Date   HGBA1C 6.5 (H) 10/23/2015   Urine Drug Screen: No results found for: LABOPIA,  COCAINSCRNUR, LABBENZ, AMPHETMU, THCU, LABBARB    IMAGING I have personally reviewed the radiological images below and agree with the radiology interpretations.  Dg Chest 2 View 10/23/2015 No active cardiopulmonary disease.  Aortic atherosclerosis.   Ct Head Wo Contrast 10/22/2015 1. No acute intracranial findings. There is mild generalized atrophy and chronic appearing white matter hypodensities which likely represent small vessel ischemic disease. Remote left caudate lacunar infarction.   Ct Cervical Spine Wo Contrast 10/22/2015 No acute cervical spine findings. Prior C4-5 anterior interbody fusion. Moderate degenerative cervical disc disease from C4 through C7.  CT repeat Stable non contrast CT appearance of the brain with no acute cortically based infarct identified. Evidence of some small vessel disease in the cerebral white matter.  CUS Unable to adequately visualize the right internal carotid artery, therefore cannot exclude hemodynamically significant stenosis. Findings suggest elevated left internal carotid artery stenosis suggestive of 40-59% stenosis. Vertebral arteries are patent with antegrade flow.  TTE - Left ventricle: The cavity size was normal. There was mild   concentric hypertrophy. Systolic function was normal. The   estimated ejection fraction was in the range of 55% to 60%. Wall   motion was normal; there were no regional wall motion   abnormalities. Doppler parameters are consistent with abnormal   left ventricular relaxation (grade 1 diastolic dysfunction). - Mitral valve: Calcified annulus. Mildly thickened, mildly   calcified  leaflets . - Right ventricle: Pacer wire or catheter noted in right ventricle. Impressions: - No cardiac source of emboli was indentified.  Pacer interrogation - 100% ventricle pacing   PHYSICAL EXAM HEENT-  Normocephalic, no lesions, without obvious abnormality.  Normal external eye and conjunctiva.  Normal TM's bilaterally.   Normal auditory canals and external ears. Normal external nose, mucus membranes and septum.  Normal pharynx. Neck supple with no masses, nodes, nodules or enlargement. Cardiovascular - regular rate and rhythm, S1, S2 normal, no murmur, click, rub or gallop Lungs - chest clear, no wheezing, rales, normal symmetric air entry Abdomen - soft, non-tender; bowel sounds normal; no masses,  no organomegaly Extremities - no joint deformities, effusion, or inflammation  Neurologic Examination: Mental Status: Alert, oriented, thought content appropriate. Morbid obesity. Speech fluent without evidence of aphasia. Able to follow commands without difficulty. Cranial Nerves: II-Visual fields were normal. III/IV/VI-Pupils were equal and reacted normally to light. Extraocular movements were full and conjugate.    V/VII-no facial numbness and no facial weakness. VIII-normal. X-normal speech and symmetrical palatal movement. XI: trapezius strength/neck flexion strength normal bilaterally XII-midline tongue extension with normal strength. Motor: 5/5 bilaterally with normal tone and bulk Sensory: Normal throughout on tactile sensation. Deep Tendon Reflexes: 1+ and symmetric. Plantars: Mute bilaterally Cerebellar: Normal finger-to-nose testing. Carotid auscultation: Normal   ASSESSMENT/PLAN [name] is a 70 y.o. female with history of hypertension, hyperlipidemia, obstructive sleep apnea, obesity, coronary artery disease and implanted cardiac defibrillator, who presented to the ED at [facility] following an episode of transient weakness and numbness involving left arm and left leg that resolved after about 30 minutes. She did not receive IV t-PA due to resolution of deficits  Stroke vs. TIA:  Right brain TIA vs. Subcortical infarct, secondary to small vessel disease source due to multiple stroke risk factors including HTN, HLD, OSA, morbid obesity, DM, CAD s/p CABG  Resultant   Deficit resolved  MRI  implanted cardiac defibrillator  MRA  implanted cardiac defibrillator  CT - No acute intracranial findings. Remote left caudate lacunar infarction.   Repeat CT no acute abnormality  TCD - pending  Carotid Doppler - left ICA 40-59% stenosis, right ICA not able to detect  2D Echo EF 55-60%  LDL - 167  HgbA1c 6.5  VTE prophylaxis - SCDs Diet heart healthy/carb modified Room service appropriate? Yes; Fluid consistency: Thin  aspirin 325 mg daily prior to admission, now on plavix for stroke prevention.  Patient counseled to be compliant with her antithrombotic medications  Ongoing aggressive stroke risk factor management  Therapy recommendations:  Home health physical therapy recommended. OT evaluation is pending.  Disposition: Pending  Hypertension  Blood pressure tends to run low  Permissive hypertension (OK if < 220/120) but gradually normalize in 5-7 days  Long-term BP goal normotensive  Hyperlipidemia  Home meds:  Pravachol 80 mg daily resumed in hospital  LDL 167, goal < 70  Changed to Lipitor 80 mg daily   Continue statin at discharge  Other Stroke Risk Factors  Advanced age  Obesity, Body mass index is 42.08 kg/m., recommend weight loss, diet and exercise as appropriate   Hx stroke/TIA by Ct scan  Coronary artery disease s/p CABG  Obstructive sleep apnea  Renal insufficiency - Cre 1.8   Hospital day # 0  Neurology will sign off. Please call with questions. Pt will follow up with Cecille Rubin NP at [facility] in about 6 weeks. Thanks for the consult.  Rosalin Hawking, MD PhD Stroke Neurology 10/24/2015 4:59 PM    To contact Stroke Continuity provider, please refer to [email or web address] After hours, contact General Neurology

## 2015-10-24 NOTE — Progress Notes (Signed)
Left upper extremity numbness would come and go throughout the evening. No new neuro deficits. Pt AAOx4, NIH 1-2. Continuing to monitor. [name], Rande Brunt, RN

## 2015-10-24 NOTE — Evaluation (Signed)
Occupational Therapy Evaluation Patient Details Name: [name] MRN: [id number] DOB: 28-Mar-1945 Today's Date: 10/24/2015    History of Present Illness Patient is a 70 y/o female with hx of cellulitis, obesity, ICD, HTN, DM, CAD and asthma presents with episode of transient weakness and numbness involving left arm and left leg. Head CT- unremarkable. Workup pending.   Clinical Impression   Rn called to room due to R UE numbness that progressed to R side of face and then L UE. Pt reports feeling scared and like she would pass out. BP obtained 132/75. Pt is at adequate level for d/c home from [place] stand point.  All education is complete and patient indicates understanding.     Follow Up Recommendations  Home health OT    Equipment Recommendations  None recommended by OT    Recommendations for Other Services       Precautions / Restrictions Precautions Precautions: Fall      Mobility Bed Mobility               General bed mobility comments: in chair on arrival  Transfers Overall transfer level: Needs assistance   Transfers: Sit to/from Stand Sit to Stand: Supervision              Balance                                            ADL Overall ADL's : Needs assistance/impaired Eating/Feeding: Independent   Grooming: Wash/dry hands;Wash/dry face;Supervision/safety   Upper Body Bathing: Supervision/ safety   Lower Body Bathing: Min guard;Sit to/from stand Lower Body Bathing Details (indicate cue type and reason): able to cross bil Le Upper Body Dressing : Supervision/safety   Lower Body Dressing: Min guard   Toilet Transfer: Min Psychiatric nurse Details (indicate cue type and reason): cues to lock rollator and correct use         Functional mobility during ADLs: Min guard;Rolling walker General ADL Comments: pt reports numbness and tingling with L UE, and then L cheek numb and then R Ue numbness Educated on [name] handout  and provided      Estate agent      Pertinent Vitals/Pain Pain Assessment: No/denies pain     Hand Dominance Right   Extremity/Trunk Assessment Upper Extremity Assessment Upper Extremity Assessment: Generalized weakness   Lower Extremity Assessment Lower Extremity Assessment: Defer to PT evaluation   Cervical / Trunk Assessment Cervical / Trunk Assessment: Kyphotic   Communication Communication Communication: No difficulties   Cognition Arousal/Alertness: Awake/alert Behavior During Therapy: WFL for tasks assessed/performed Overall Cognitive Status: Within Functional Limits for tasks assessed                     General Comments       Exercises       Shoulder Instructions      Home Living Family/patient expects to be discharged to:: Private residence Living Arrangements: Children Available Help at Discharge: Available PRN/intermittently;Family Type of Home: House Home Access: Stairs to enter [employer] of Steps: 3 Entrance Stairs-Rails: Right;Left Home Layout: Two level;Able to live on main level with bedroom/bathroom;One level Alternate Level Stairs-Number of Steps: flight Alternate Level Stairs-Rails: Right;Left Bathroom Shower/Tub: Occupational psychologist: Standard     Home Equipment: Environmental consultant - 2 wheels;Bedside commode  Prior Functioning/Environment Level of Independence: Independent        Comments: Just started being able to do dishes, cleaning again.    OT Diagnosis: Generalized weakness;Acute pain   OT Problem List: Decreased strength;Decreased activity tolerance;Impaired balance (sitting and/or standing);Decreased safety awareness;Decreased knowledge of use of DME or AE;Decreased knowledge of precautions;Pain;Obesity   OT Treatment/Interventions:      OT Goals(Current goals can be found in the care plan section) Acute Rehab OT Goals Patient Stated Goal: go home OT Goal  Formulation: With patient  OT Frequency:     Barriers to D/C:            Co-evaluation              End of Session Equipment Utilized During Treatment: Gait belt;Rolling walker Nurse Communication: Mobility status;Precautions  Activity Tolerance: Patient tolerated treatment well Patient left: in chair;with call bell/phone within reach;with nursing/sitter in room   Time: 5374-8270 OT Time Calculation (min): 23 min Charges:  OT General Charges $OT Visit: 1 Procedure OT Evaluation $OT Eval Moderate Complexity: 1 Procedure G-Codes: OT G-codes **NOT FOR INPATIENT CLASS** Functional Assessment Tool Used: clinical judgement Functional Limitation: Self care Self Care Current Status (B8675): At least 1 percent but less than 20 percent impaired, limited or restricted Self Care Goal Status (Q4920): At least 1 percent but less than 20 percent impaired, limited or restricted Self Care Discharge Status [phone]): At least 1 percent but less than 20 percent impaired, limited or restricted  Peri Maris 10/24/2015, 3:44 PM  Jeri Modena   OTR/L Pager: [phone] Office: [phone] .

## 2015-10-24 NOTE — Progress Notes (Signed)
Physical Therapy Treatment Patient Details Name: [name] MRN: [id number] DOB: 17-Aug-1945 Today's Date: 10/24/2015    History of Present Illness Patient is a 70 y/o female with hx of cellulitis, obesity, ICD, HTN, DM, CAD and asthma presents with episode of transient weakness and numbness involving left arm and left leg. Head CT- unremarkable. Workup pending.    PT Comments    Patient continues to make progress toward mobility goals. Pt presented with unsteadiness at times (but improved from previous session) and DOE and would benefit from rollator for energy conservation. Scored 19 on DGI this session. Current plan remains appropriate.   Follow Up Recommendations  Home health PT;Supervision for mobility/OOB     Equipment Recommendations  Other (comment) (rollator)    Recommendations for Other Services       Precautions / Restrictions Precautions Precautions: Fall Restrictions Weight Bearing Restrictions: No    Mobility  Bed Mobility               General bed mobility comments: Sitting in chair upon PT arrival.   Transfers Overall transfer level: Needs assistance Equipment used: None Transfers: Sit to/from Stand Sit to Stand: Supervision         General transfer comment: supervision for safety; cues for hand placement when descending to surface  Ambulation/Gait Ambulation/Gait assistance: Min guard Ambulation Distance (Feet): 220 Feet Assistive device: None Gait Pattern/deviations: Step-through pattern;Decreased stride length;Drifts right/left;Wide base of support Gait velocity: decreased   General Gait Details: pt with no LOB and grossly steady gait but with tendency to drift R and L; standing rest breaks X2 and one seated rest break; cues for breathing technique due to DOE   Stairs Stairs: Yes Stairs assistance: Min guard Stair Management: One rail Right;Step to pattern Number of Stairs: 5 (2,3) General stair comments: pt educated on energy  conservation techniques  Wheelchair Mobility    Modified Rankin (Stroke Patients Only)       Balance Overall balance assessment: Needs assistance   Sitting balance-Leahy Scale: Good       Standing balance-Leahy Scale: Fair                      Cognition Arousal/Alertness: Awake/alert Behavior During Therapy: WFL for tasks assessed/performed Overall Cognitive Status: Within Functional Limits for tasks assessed                      Exercises      General Comments        Pertinent Vitals/Pain Pain Assessment: Faces Faces Pain Scale: Hurts a little bit Pain Location: head; L ankle Pain Descriptors / Indicators: Sore;Headache Pain Intervention(s): Limited activity within patient's tolerance;Monitored during session;Repositioned;RN gave pain meds during session    Home Living                      Prior Function            PT Goals (current goals can now be found in the care plan section) Acute Rehab PT Goals Patient Stated Goal: go home PT Goal Formulation: With patient Time For Goal Achievement: 11/06/15 Potential to Achieve Goals: Good Progress towards PT goals: Progressing toward goals    Frequency  Min 4X/week    PT Plan Current plan remains appropriate    Co-evaluation             End of Session Equipment Utilized During Treatment: Gait belt Activity Tolerance: Patient tolerated treatment well Patient  left: in chair;with call bell/phone within reach     Time: 0914-0944 PT Time Calculation (min) (ACUTE ONLY): 30 min  Charges:  $Gait Training: 8-22 mins $Therapeutic Activity: 8-22 mins                    G Codes:     Salina April, PTA Pager: [phone]   10/24/2015, 9:55 AM

## 2015-10-24 NOTE — Progress Notes (Signed)
  Echocardiogram 2D Echocardiogram has been performed.  [name] 10/24/2015, 10:51 AM

## 2015-10-24 NOTE — Discharge Summary (Signed)
Physician Discharge Summary  PAMI WOOL MOQ:[id number] DOB: 1945-12-18 DOA: 10/22/2015  PCP: [name], MD  Admit date: 10/22/2015 Discharge date: 10/24/2015  Admitted From: Home  Disposition:  Home  Recommendations for Outpatient Follow-up:  1. Follow up with PCP in 1 week. 2. Follow up with Cardiology in 1 week.  3. Follow up with Neurology in 6 weeks - Cecille Rubin Np/  4. Please follow up on the following pending results:  Transcranial doppler results are pending  Home Health: Home health PT OT  Equipment/Devices: None  Discharge Condition: Stable  CODE STATUS:Full  Diet recommendation: Heart Healthy / Carb Modified   Brief/Interim Summary: Alicia Carlino [name] a 70 y.o.femalewith medical history of CAD s/p CABG October 2016 (LIMA to LAD, SVG to D1, SVG to OM1 and OM2, SVG to PDA), heart block s/p bivent pacer 2008, chronic HFpEF, morbid obesity, adrenal insufficiency, T2DM, HTN, stage III CKD, asthma, and OSA who presented withtransientleft arm and leg numbness and difficulty with gait. She also reported chronic chest pains and worsening dyspnea. She was admitted for CVA work up, ACS rule out, and treatment for presumed asthma exacerbation. Neurological deficits have resolved. She has a pacemaker not compatible with MRI and she has an allergy to IV contrast, so CVA work up was limited. Head CT showed remote left lacunar infarct. CT neck without contrast was unrevealing. Her chest pain resolved. ECG was paced, troponins negative, BNP was normal and she was under EDW. CXR showed no definite infiltrate, though she reported increased dyspnea and sputum production. She was given steroids and antibiotics.   TIA: No acute CVA on CT, symptoms resolved. MRI contraindicated due to pacemaker. IV contrast contraindicated due to allergy. - PT/OT: home health PT  - Repeat Ct: Stable non contrast CT appearance of the brain with no acute cortically based infarct identified - Carotid  doppler: elevated left internal carotid artery stenosis suggestive of 40-59% stenosis  - Echo: No cardiac source of emboli was indentified. - Transcranial doppler pending  - Stroke team following - LDL 167. Switch to lipitor 80mg    - Ha1c 6.5 - Switch to plavix   Questionable paroxysmal A Fib/Flutter - 10/18/15: interrogation showed 30 sec of A Fib/A Flutter. Spoke with neurology and cardiology. Not sure if this represents true A Fib/A Flutter. CHADSVASc is 6. If this represents true A Fib/A Flutter, will need anticoagulation. This should be further looked into outpatient.  Asthma exacerbation:With no definite infiltrate on CXR, but increased sputum and dyspnea. Exam reassuring - Doxycycline 9/13-9/16 (switched from levaquin)  - Prednisone 40mg  daily (9/11 >> 9/16) - Continue formulary ICS, albuterol prn  CAD s/p multivessel CABG and chronic diastolic heart failure: complicated by heart block with biventricular pacer, interrogation showing 100% pacing. No evidence of acute exacerbation.  - Continue home beta blocker, imdur (recently started for chest pain), torsemide, spironolactone.  - Continue statin - Low suspicion for ACS: troponins negative, ECG paced, echo pending  OSA: Not adherent to CPAP at home  Bacteriuria and pyuria: Asymptomatic, so no treatment indicated - Urine culture showing multiple organism, likely contaminant    Discharge Diagnoses:  Active Problems:   Essential hypertension   Asthma   OSA (obstructive sleep apnea)   Mixed hypercholesterolemia and hypertriglyceridemia   CAD, multiple vessel   Chronic diastolic heart failure (HCC)   Complete heart block (HCC)   TIA (transient ischemic attack)   Chest pain   Atrial fibrillation [facility])   Discharge Instructions  Discharge Instructions  Ambulatory referral to Neurology    Complete by:  As directed    Follow up with NP Cecille Rubin at [facility] in about 6 weeks. Thanks.   Diet - low sodium heart healthy     Complete by:  As directed    Increase activity slowly    Complete by:  As directed        Medication List    STOP taking these medications   aspirin 325 MG EC tablet   pravastatin 80 MG tablet Commonly known as:  PRAVACHOL     TAKE these medications   acetaminophen 500 MG tablet Commonly known as:  TYLENOL Take 1,000 mg by mouth every 6 (six) hours as needed (for pain.).   albuterol 108 (90 Base) MCG/ACT inhaler Commonly known as:  PROVENTIL HFA;VENTOLIN HFA Inhale 2 puffs into the lungs every 4 (four) hours as needed for wheezing (wheezing).   atorvastatin 80 MG tablet Commonly known as:  LIPITOR Take 1 tablet (80 mg total) by mouth daily at 6 PM.   carvedilol 6.25 MG tablet Commonly known as:  COREG TAKE ONE TABLET BY MOUTH TWICE DAILY WITH  A  MEAL   cetirizine 10 MG tablet Commonly known as:  ZYRTEC Take 10 mg by mouth daily.   clopidogrel 75 MG tablet Commonly known as:  PLAVIX Take 1 tablet (75 mg total) by mouth daily. Start taking on:  10/25/2015   clotrimazole 10 MG troche Commonly known as:  MYCELEX Take 10 mg by mouth every 4 (four) hours as needed (mouth pain).   doxycycline 100 MG capsule Commonly known as:  MONODOX Take 1 capsule (100 mg total) by mouth 2 (two) times daily.   EPINEPHrine 0.3 mg/0.3 mL Soaj injection Commonly known as:  EPI-PEN   fluorometholone 0.1 % ophthalmic suspension Commonly known as:  FML Place 1 drop into both eyes 2 (two) times daily.   fluticasone 50 MCG/ACT nasal spray Commonly known as:  FLONASE Place 1 spray into the nose 2 (two) times daily as needed for rhinitis or allergies.   isosorbide mononitrate 30 MG 24 hr tablet Commonly known as:  IMDUR Take 1 tablet (30 mg total) by mouth daily.   KLOR-CON M20 20 MEQ tablet Generic drug:  potassium chloride SA TAKE ONE TABLET BY MOUTH ONCE DAILY What changed:  See the new instructions.   omeprazole 40 MG capsule Commonly known as:  PRILOSEC Take 1 capsule  (40 mg total) by mouth 2 (two) times daily before a meal.   predniSONE 20 MG tablet Commonly known as:  DELTASONE Take 2 tablets (40 mg total) by mouth daily with breakfast. Start taking on:  10/25/2015   spironolactone 25 MG tablet Commonly known as:  ALDACTONE Take 25 mg by mouth daily. What changed:  Another medication with the same name was removed. Continue taking this medication, and follow the directions you see here.   SYMBICORT 160-4.5 MCG/ACT inhaler Generic drug:  budesonide-formoterol Inhale 2 puffs into the lungs 2 (two) times daily.   torsemide 20 MG tablet Commonly known as:  DEMADEX Take 40 mg by mouth 2 (two) times daily.   triamcinolone cream 0.1 % Commonly known as:  KENALOG Apply 1 application topically daily as needed (applied to affected itchy area(s)).      Follow-up Information    [facility] .   Why:  they will contact you for the first appointment Contact information: [address] [place] [address] [phone]  MARTIN,NANCY CAROLYN, NP Follow up in 6 week(s).   Specialty:  Family Medicine Why:  Stroke follow up Contact information: [place] [address] [phone]          Allergies  Allergen Reactions  . Ivp Dye [Iodinated Diagnostic Agents] Shortness Of Breath    No reaction to PO contrast with non-ionic dye.06-25-2014/rsm  . Shellfish-Derived Products Anaphylaxis  . Sulfa Antibiotics Shortness Of Breath  . Iodine Hives  . Cephalexin Itching and Rash    Rash & itch after Keflex  . Zithromax [Azithromycin] Rash    Consultations:  Neurology    Procedures/Studies: Dg Chest 2 View  Result Date: 10/23/2015 CLINICAL DATA:  Midchest pain for 1 week.  Shortness of breath. EXAM: CHEST  2 VIEW COMPARISON:  Chest radiograph 06/20/2015 FINDINGS: Right chest wall AICD leads are unchanged in position. Cardiomediastinal contours are unchanged. No focal airspace  consolidation or pulmonary edema. No pneumothorax or sizable pleural effusion. CABG markers and median sternotomy wires are unchanged. IMPRESSION: No active cardiopulmonary disease.  Aortic atherosclerosis. Electronically Signed   By: Ulyses Jarred M.D.   On: 10/23/2015 00:58   Ct Head Wo Contrast  Result Date: 10/24/2015 CLINICAL DATA:  70 year old female left arm and leg paresthesia since 10/22/2015. Initial encounter. EXAM: CT HEAD WITHOUT CONTRAST TECHNIQUE: Contiguous axial images were obtained from the base of the skull through the vertex without intravenous contrast. COMPARISON:  Head and cervical spine CT without contrast 10/22/2015. FINDINGS: Brain: Cerebral volume is within normal limits for age. Occasional patchy white matter hypodensity appears stable (including in the right frontal lobe white matter series 2, image 22). Similar findings in the anterior external capsules are stable. No cortically based acute infarct identified. No acute intracranial hemorrhage identified. No midline shift, mass effect, or evidence of intracranial mass lesion. No ventriculomegaly. Vascular: Calcified atherosclerosis at the skull base. No suspicious intracranial vascular hyperdensity. Skull: Stable visualized osseous structures. Sinuses/Orbits: Visualized paranasal sinuses and mastoids are stable and well pneumatized. Other: No acute orbit or scalp soft tissue findings. IMPRESSION: Stable non contrast CT appearance of the brain with no acute cortically based infarct identified. Evidence of some small vessel disease in the cerebral white matter. Electronically Signed   By: Genevie Ann M.D.   On: 10/24/2015 12:23   Ct Head Wo Contrast  Result Date: 10/22/2015 CLINICAL DATA:  Left arm and leg paresthesias, onset at 11:00. Partial resolution since that time. EXAM: CT HEAD WITHOUT CONTRAST CT CERVICAL SPINE WITHOUT CONTRAST TECHNIQUE: Multidetector CT imaging of the head and cervical spine was performed following the  standard protocol without intravenous contrast. Multiplanar CT image reconstructions of the cervical spine were also generated. COMPARISON:  None. FINDINGS: CT HEAD FINDINGS Brain: There is no intracranial hemorrhage, mass or evidence of acute infarction. Remote lacunar infarction in the left caudate. There is mild generalized atrophy. There is mild chronic microvascular ischemic change. There is no significant extra-axial fluid collection. No acute intracranial findings are evident. Vascular: No hyperdense vessel or unexpected calcification. Skull: Normal. Negative for fracture or focal lesion. Sinuses/Orbits: No acute finding. CT CERVICAL SPINE FINDINGS Alignment: Mild straightening of cervical lordosis. Prior anterior interbody fusion with plate screw fixation hardware at C4-5. Skull base and vertebrae: No acute fracture. No primary bone lesion or focal pathologic process. Soft tissues and spinal canal: No prevertebral fluid or swelling. No visible canal hematoma. Disc levels: Moderate degenerative disc changes from C4 through C7. Prominent posterior osteophytes at C5-6 and C6-7 likely cause  mild degenerative central canal stenosis. Upper chest: No significant abnormality Other: IMPRESSION: 1. No acute intracranial findings. There is mild generalized atrophy and chronic appearing white matter hypodensities which likely represent small vessel ischemic disease. Remote left caudate lacunar infarction. 2. No acute cervical spine findings. Prior C4-5 anterior interbody fusion. Moderate degenerative cervical disc disease from C4 through C7. Electronically Signed   By: Andreas Newport M.D.   On: 10/22/2015 22:49   Ct Cervical Spine Wo Contrast  Result Date: 10/22/2015 CLINICAL DATA:  Left arm and leg paresthesias, onset at 11:00. Partial resolution since that time. EXAM: CT HEAD WITHOUT CONTRAST CT CERVICAL SPINE WITHOUT CONTRAST TECHNIQUE: Multidetector CT imaging of the head and cervical spine was performed  following the standard protocol without intravenous contrast. Multiplanar CT image reconstructions of the cervical spine were also generated. COMPARISON:  None. FINDINGS: CT HEAD FINDINGS Brain: There is no intracranial hemorrhage, mass or evidence of acute infarction. Remote lacunar infarction in the left caudate. There is mild generalized atrophy. There is mild chronic microvascular ischemic change. There is no significant extra-axial fluid collection. No acute intracranial findings are evident. Vascular: No hyperdense vessel or unexpected calcification. Skull: Normal. Negative for fracture or focal lesion. Sinuses/Orbits: No acute finding. CT CERVICAL SPINE FINDINGS Alignment: Mild straightening of cervical lordosis. Prior anterior interbody fusion with plate screw fixation hardware at C4-5. Skull base and vertebrae: No acute fracture. No primary bone lesion or focal pathologic process. Soft tissues and spinal canal: No prevertebral fluid or swelling. No visible canal hematoma. Disc levels: Moderate degenerative disc changes from C4 through C7. Prominent posterior osteophytes at C5-6 and C6-7 likely cause mild degenerative central canal stenosis. Upper chest: No significant abnormality Other: IMPRESSION: 1. No acute intracranial findings. There is mild generalized atrophy and chronic appearing white matter hypodensities which likely represent small vessel ischemic disease. Remote left caudate lacunar infarction. 2. No acute cervical spine findings. Prior C4-5 anterior interbody fusion. Moderate degenerative cervical disc disease from C4 through C7. Electronically Signed   By: Andreas Newport M.D.   On: 10/22/2015 22:49  Echo Study Conclusions - Left ventricle: The cavity size was normal. There was mild   concentric hypertrophy. Systolic function was normal. The   estimated ejection fraction was in the range of 55% to 60%. Wall   motion was normal; there were no regional wall motion   abnormalities.  Doppler parameters are consistent with abnormal   left ventricular relaxation (grade 1 diastolic dysfunction). - Mitral valve: Calcified annulus. Mildly thickened, mildly   calcified leaflets . - Right ventricle: Pacer wire or catheter noted in right ventricle.  Impressions: - No cardiac source of emboli was indentified.   Carotid duplex - final results pending. Prelim: Study was very technically difficult and limited due to patient body habitus, depth of vessels, and patient anatomy. Unable to adequately visualize the right internal carotid artery, therefore cannot exclude hemodynamically significant stenosis. Findings suggest elevated left internal carotid artery stenosis suggestive of 40-59% stenosis. Vertebral arteries are patent with antegrade flow.  Transcranial doppler - final results pending    Subjective: Patient doing well today. She continued to work with therapy. During therapy, she had a brief episode where her left shoulder/arm/hand were tingling and numb. This resolved immediately. She states that this has happened before; she has a plate in her neck from 4 years ago. She currently denies any focal symptoms. She denies any chest pain, shortness of breath, nausea, vomiting, diarrhea, dysuria, abdominal pain, focal weakness.  Discharge Exam: Vitals:   10/24/15 0953 10/24/15 1356  BP: (!) 144/68 127/72  Pulse: 76 78  Resp: 19 19  Temp: 97.7 F (36.5 C) 98.5 F (36.9 C)   Vitals:   10/24/15 0448 10/24/15 0804 10/24/15 0953 10/24/15 1356  BP: 139/68  (!) 144/68 127/72  Pulse: 71  76 78  Resp: 20  19 19   Temp: 97.5 F (36.4 C)  97.7 F (36.5 C) 98.5 F (36.9 C)  TempSrc: Oral  Oral Oral  SpO2: 96% 94% 95% 92%  Weight:      Height:        General exam: Appears calm and comfortable  Respiratory system: Clear to auscultation. Respiratory effort normal. Cardiovascular system: S1 & S2 heard, RRR. No JVD, murmurs, rubs, gallops or clicks. No pedal  edema. Gastrointestinal system: Abdomen is nondistended, soft and nontender. No organomegaly or masses felt. Normal bowel sounds heard. Central nervous system: Alert and oriented. No focal neurological deficits. Extremities: Symmetric 5 x 5 power. Skin: No rashes, lesions or ulcers Psychiatry: Judgement and insight appear normal. Mood & affect appropriate.     The results of significant diagnostics from this hospitalization (including imaging, microbiology, ancillary and laboratory) are listed below for reference.     Microbiology: Recent Results (from the past 240 hour(s))  Culture, Urine     Status: Abnormal   Collection Time: 10/23/15  3:27 PM  Result Value Ref Range Status   Specimen Description URINE, RANDOM  Final   Special Requests NONE  Final   Culture MULTIPLE SPECIES PRESENT, SUGGEST RECOLLECTION (A)  Final   Report Status 10/24/2015 FINAL  Final     Labs: BNP (last 3 results)  Recent Labs  12/26/14 0410 01/10/15 1601 10/18/15 1204  BNP 359.7* 137.1* 52.7   Basic Metabolic Panel:  Recent Labs Lab 10/22/15 2134  NA 140  K 4.3  CL 104  CO2 28  GLUCOSE 117*  BUN 27*  CREATININE 1.18*  CALCIUM 9.2   Liver Function Tests:  Recent Labs Lab 10/22/15 2134  AST 17  ALT 22  ALKPHOS 97  BILITOT 0.4  PROT 7.1  ALBUMIN 3.5   No results for input(s): LIPASE, AMYLASE in the last 168 hours. No results for input(s): AMMONIA in the last 168 hours. CBC:  Recent Labs Lab 10/22/15 2134  WBC 7.6  NEUTROABS 5.3  HGB 13.2  HCT 43.7  MCV 88.6  PLT 156   Cardiac Enzymes:  Recent Labs Lab 10/23/15 0414 10/23/15 0958 10/23/15 1508  TROPONINI <0.03 <0.03 <0.03   BNP: Invalid input(s): POCBNP CBG: No results for input(s): GLUCAP in the last 168 hours. D-Dimer  Recent Labs  10/23/15 0019  DDIMER 1.56*   Hgb A1c  Recent Labs  10/23/15 0423  HGBA1C 6.5*   Lipid Profile  Recent Labs  10/23/15 0414  CHOL 241*  HDL 40*  LDLCALC 167*   TRIG 172*  CHOLHDL 6.0   Thyroid function studies No results for input(s): TSH, T4TOTAL, T3FREE, THYROIDAB in the last 72 hours.  Invalid input(s): FREET3 Anemia work up No results for input(s): VITAMINB12, FOLATE, FERRITIN, TIBC, IRON, RETICCTPCT in the last 72 hours. Urinalysis    Component Value Date/Time   COLORURINE YELLOW 10/22/2015 2353   APPEARANCEUR CLOUDY (A) 10/22/2015 2353   LABSPEC 1.026 10/22/2015 2353   PHURINE 5.5 10/22/2015 2353   GLUCOSEU NEGATIVE 10/22/2015 2353   HGBUR NEGATIVE 10/22/2015 2353   BILIRUBINUR NEGATIVE 10/22/2015 2353   KETONESUR NEGATIVE 10/22/2015 2353   PROTEINUR  NEGATIVE 10/22/2015 2353   UROBILINOGEN 0.2 12/06/2014 1531   NITRITE NEGATIVE 10/22/2015 2353   LEUKOCYTESUR SMALL (A) 10/22/2015 2353   Sepsis Labs Invalid input(s): PROCALCITONIN,  WBC,  LACTICIDVEN Microbiology Recent Results (from the past 240 hour(s))  Culture, Urine     Status: Abnormal   Collection Time: 10/23/15  3:27 PM  Result Value Ref Range Status   Specimen Description URINE, RANDOM  Final   Special Requests NONE  Final   Culture MULTIPLE SPECIES PRESENT, SUGGEST RECOLLECTION (A)  Final   Report Status 10/24/2015 FINAL  Final     Time coordinating discharge: Over 30 minutes  SIGNED:  Dessa Phi, DO [facility] Pager [phone]  If 7PM-7AM, please contact night-coverage [email or web address] Password [facility] 10/24/2015, 5:35 PM

## 2015-10-24 NOTE — Progress Notes (Signed)
OT Cancellation Note  Patient Details Name: BERLYN SAYLOR MRN: [id number] DOB: [birth year]   Cancelled Treatment:    Reason Eval/Treat Not Completed: Patient at procedure or test/ unavailable  Vonita Moss   OTR/L Pager: [phone] Office: [phone] .  10/24/2015, 10:25 AM

## 2015-10-24 NOTE — Care Management Note (Signed)
Case Management Note  Patient Details  Name: [name] MRN: [id number] Date of Birth: 19-Jan-1946  Subjective/Objective:                    Action/Plan: Plan is for patient to discharge home with [facility] services. CM met with the patient to provide a list of [place] agencies in the [facility] [place] area. She asked to use the [facility] agency she used in the past. CM found she used [facility] in Nov 2016. Marie with [facility] notified and accepted the referral. Pt also with orders for rollator. Jermaine with [facility] DME notified and will deliver the equipment to the room. Will update the bedside RN.   Expected Discharge Date:                  Expected Discharge Plan:  [place]  In-House Referral:     Discharge planning Services  CM Consult  Post Acute Care Choice:  Home Health, Durable Medical Equipment Choice offered to:  Patient  DME Arranged:  [place] rolling with seat DME Agency:  [place]:  PT [place]:  [place]  Status of Service:  Completed, signed off  If discussed at [place] of Stay Meetings, dates discussed:    Additional Comments:  Pollie Friar, RN 10/24/2015, 12:18 PM

## 2015-10-24 NOTE — Progress Notes (Addendum)
   Transcranial Doppler completed. 10/24/2015 11:46 AM    *PRELIMINARY RESULTS* Vascular Ultrasound Carotid Duplex (Doppler) has been completed.   Study was very technically difficult and limited due to patient body habitus, depth of vessels, and patient anatomy. Unable to adequately visualize the right internal carotid artery, therefore cannot exclude hemodynamically significant stenosis.  Findings suggest elevated left internal carotid artery stenosis suggestive of 40-59% stenosis.  Vertebral arteries are patent with antegrade flow.  10/24/2015 11:46 AM Maudry Mayhew, BS, RVT, RDCS, RDMS

## 2015-10-25 ENCOUNTER — Telehealth: Payer: Self-pay | Admitting: Cardiovascular Disease

## 2015-10-25 LAB — VAS US CAROTID
LEFT ECA DIAS: 54 cm/s
LEFT VERTEBRAL DIAS: 7 cm/s
Left CCA dist dias: -20 cm/s
Left CCA dist sys: -79 cm/s
Left CCA prox dias: 20 cm/s
Left CCA prox sys: 64 cm/s
Left ICA dist dias: -27 cm/s
Left ICA dist sys: -67 cm/s
Left ICA prox dias: 54 cm/s
Left ICA prox sys: 132 cm/s
RIGHT ECA DIAS: 19 cm/s
RIGHT VERTEBRAL DIAS: 11 cm/s
Right CCA prox dias: 13 cm/s
Right CCA prox sys: 68 cm/s

## 2015-10-25 NOTE — Telephone Encounter (Signed)
Mrs. Gatliff is calling because she was just released from the hospital and is a little confused on what was told to her and is wanting to speak with a nurse . Please call   Thanks

## 2015-10-25 NOTE — Telephone Encounter (Signed)
Spoke with patient and reviewed her medications. Patient stated she did not want to take a blood thinner. Advised to get Plavix and start as directed at d/c. Stated she was feeling well today Did schedule follow up with Janan Ridge, PA next week

## 2015-10-31 ENCOUNTER — Encounter: Payer: Medicaid Other | Admitting: Gastroenterology

## 2015-10-31 ENCOUNTER — Encounter: Payer: Self-pay | Admitting: Physician Assistant

## 2015-10-31 ENCOUNTER — Ambulatory Visit (INDEPENDENT_AMBULATORY_CARE_PROVIDER_SITE_OTHER): Payer: Commercial Managed Care - HMO | Admitting: Physician Assistant

## 2015-10-31 VITALS — BP 112/75 | HR 84 | Ht 65.0 in | Wt 245.4 lb

## 2015-10-31 DIAGNOSIS — I5032 Chronic diastolic (congestive) heart failure: Secondary | ICD-10-CM | POA: Diagnosis not present

## 2015-10-31 DIAGNOSIS — E118 Type 2 diabetes mellitus with unspecified complications: Secondary | ICD-10-CM

## 2015-10-31 DIAGNOSIS — N183 Chronic kidney disease, stage 3 unspecified: Secondary | ICD-10-CM

## 2015-10-31 DIAGNOSIS — G459 Transient cerebral ischemic attack, unspecified: Secondary | ICD-10-CM | POA: Diagnosis not present

## 2015-10-31 DIAGNOSIS — E785 Hyperlipidemia, unspecified: Secondary | ICD-10-CM

## 2015-10-31 DIAGNOSIS — Z950 Presence of cardiac pacemaker: Secondary | ICD-10-CM

## 2015-10-31 DIAGNOSIS — I25810 Atherosclerosis of coronary artery bypass graft(s) without angina pectoris: Secondary | ICD-10-CM

## 2015-10-31 DIAGNOSIS — G4733 Obstructive sleep apnea (adult) (pediatric): Secondary | ICD-10-CM

## 2015-10-31 DIAGNOSIS — Z9989 Dependence on other enabling machines and devices: Secondary | ICD-10-CM

## 2015-10-31 MED ORDER — ATORVASTATIN CALCIUM 80 MG PO TABS: 80.0000 mg | ORAL_TABLET | Freq: Every day | ORAL | 6 refills | Status: DC

## 2015-10-31 NOTE — Patient Instructions (Addendum)
Medications:  STOP taking Pravastatin and start Atorvastatin 80 mg daily.   Labwork:  Your physician recommends that you return for a FASTING lipid profile and Hepatic function panel in 2 months.   Other Instructions:  Remote monitoring is used to monitor your Pacemaker of ICD from home. This monitoring reduces the number of office visits required to check your device to one time per year. It allows [place] to keep an eye on the functioning of your device to ensure it is working properly. Please send remote transmission 2 weeks from today. We will forward the results to Dr. [name].   Follow-Up:  Please keep your upcoming appointment scheduled for January 23, 2016 with Dr. [name] at 9:30 am.  If you need a refill on your cardiac medications before your next appointment, please call your pharmacy.

## 2015-10-31 NOTE — Progress Notes (Signed)
Cardiology Office Note    Date:  10/31/2015   ID:  ADDY MCMANNIS, DOB Nov 21, 1945, MRN [id number]  PCP:  [name], MD  Cardiologist:  Dr. [name]  Chief Complaint  Patient presents with  . Hospitalization Follow-up    seen for Dr. [name]    History of Present Illness:  LYNISE PORR is a 70 y.o. female with PMH of CAD s/p CABG 11/2014 (LIMA to LAD, SVG to D1, SVG to OM1 and OM2, SVG to PDA), h/o diastolic heart failure, morbid obesity, adrenal insufficiency, type 2 diabetes mellitus, hypertension, stage III CKD, asthma and obstructive sleep apnea. She had a history of nonischemic cardiomyopathy and heart failure preceding the diagnosis of this CAD. She also had complete heart block and pacemaker placed in 2001, this was upgraded to biventricular ICD implanted in 2008 for severe cardiac myopathy with EF 20%, downgraded to CRT-P in 2014 ([place]) following excellent response from resynchronization with normalization of LV function. The current atrial and right ventricular leads were implanted in 2001, LV lead was implanted in 2008. The defibrillator leads implanted in 2008 was capped and its tip resected at the time of bypass surgery. Per [name] note, since bypass surgery, atrial pacing thresholds has been extremely high.   She was last seen in the office on 10/18/2015, she weighed 243 pounds which was lower than previously estimated dry weight of around 246 pounds. In the past, she had signs and the symptom of hypovolemia at 237 pounds. Interrogation of her [employer] CRT-P device showed 100% biventricular pacing. She has very high atrial pacing thresholds and almost never require atrial pacing. The device was programmed VDD. Estimated device longevity was 8 years.The left ventricular lead is programmed LVtip to LVring. Heart rate histogram distribution is normal. No recorded atrial or ventricular tachyarrhythmia. She was encouraged to start on the 30 mg  daily of Imdur as recommended by our heart failure clinic. 3 month follow-up was recommended.  Patient presented to the hospital on 10/22/2015 for sudden onset of left arm and the left leg numbness associated with headache and difficulty with gait. She was seen by neurology who recommended switching high-dose aspirin to Plavix for stroke prevention. MRI was unable to be obtained as her pacer was not compatible with MRI. CT of the head showed remote left lacunar infarct, CT of neck was unrevealing. Home Pravachol 80 mg daily was switched to Lipitor 80 mg daily. Carotid duplex was performed on 9/13 which was very technically difficult, however she findings suggest left internal carotid artery stenosis ranging 40-59% transcranial ultrasound obtained on 9/13 was also difficult with limited evaluation of anterior circulatory system bilaterally due to absent right temporal window, low normal mean flow velocity he may identify the vessels throughout with high pulsatility indexes suggesting likely diffuse intracranial atherosclerosis. Echocardiogram obtained on the same day showed EF 55-60%, mild LVH, grade 1 diastolic dysfunction, normal wall motion, no obvious source of cardiac emboli. Apparently last interrogation of the device on 10/18/2015 showed 30 seconds of A. fib/atrial flutter, the case was discussed with neurology and cardiology, however was unclear if this represented true A. fib/A flutter at the time. Outpatient assessment was recommended.  She has been doing well since discharge. She still have minimal left-sided weakness. She also has some chest discomfort, she says it is sore on touch. Given his recent normal echocardiogram, I really think this is less likely to be cardiac in nature. His LDL was twice as high as her goal,  we have discussed this, turned out she has not been taking her pravastatin at home as she was concerned about him possible myalgia. Given her diabetes and history of CABG, I strongly  encouraged her to start on the statin medication. She is not sure if she is taking pravastatin or Lipitor. I have encouraged her to take Lipitor 80 mg daily. We will obtain fasting lipid panel and LFT in 2 months. We also need to confirm whether or not she has atrial fibrillation before consideration to start on Coumadin, her last interrogation was 2 weeks ago, I have asked her to send me another remote transmission in 2 weeks. If confirmed she has atrial fibrillation, we ought to start thinking about discontinuing her Plavix and change it to Coumadin or eliquis. She is concerned about bleeding issue is her husband is to be on Coumadin and had bleeding.   Past Medical History:  Diagnosis Date  . Anemia   . Arthritis   . Asthma   . CAD (coronary artery disease)   . Cellulitis and abscess of foot 12/2014   RT FOOT  . Diabetes mellitus without complication ([place])   . Diverticulosis   . Fatty liver disease, nonalcoholic   . Gastritis   . GERD (gastroesophageal reflux disease)   . H/O hiatal hernia   . Hyperplastic colon polyp   . Hypertension   . IBS (irritable bowel syndrome)   . ICD (implantable cardiac defibrillator) in place   . Internal hemorrhoids   . Obesity   . OSA (obstructive sleep apnea)   . Shortness of breath     Past Surgical History:  Procedure Laterality Date  . ABDOMINAL ULTRASOUND  12/01/2011   Peripelvic cysts- #1- 2.4x1.9x2.3cm, #2-1.2x0.9x1.2cm  . ANKLE SURGERY     right  . APPENDECTOMY    . BIV PACEMAKER GENERATOR CHANGE OUT N/A 11/02/2012   Procedure: BIV PACEMAKER GENERATOR CHANGE OUT;  Surgeon: Sanda Klein, MD;  Location: [place] [facility] CATH LAB;  Service: Cardiovascular;  Laterality: N/A;  . CARDIAC CATHETERIZATION  05/17/1999   No significant coronary obstructive disease w/ mild 20% luminal irregularity of the first diag branch of the LAD  . CARDIAC CATHETERIZATION  07/08/2002   No significant CAD, moderately depressed LV systolic function  . CARDIAC CATHETERIZATION  Bilateral 04/26/2007   Normal findings, recommend medical therapy  . CARDIAC CATHETERIZATION  02/18/2008   Moderate CAD, would benefit from having a functional study, recommend continue medical therapy  . CARDIAC CATHETERIZATION  07/23/2012   Medical therapy  . CARDIAC CATHETERIZATION N/A 11/24/2014   Procedure: Left Heart Cath and Coronary Angiography;  Surgeon: Troy Sine, MD;  Location: [facility] CV LAB;  Service: Cardiovascular;  Laterality: N/A;  . CARDIAC CATHETERIZATION  11/27/2014   Procedure: Intravascular Pressure Wire/FFR Study;  Surgeon: Peter M Martinique, MD;  Location: [place] [facility] CV LAB;  Service: Cardiovascular;;  . CARDIAC DEFIBRILLATOR PLACEMENT    . CERVICAL SPINE SURGERY     plate   . CORONARY ANGIOGRAM  2010  . CORONARY ARTERY BYPASS GRAFT N/A 11/29/2014   Procedure: CORONARY ARTERY BYPASS GRAFTING x 5 (LIMA-LAD, SVG-D, SVG-OM1-OM2, SVG-PD);  Surgeon: Melrose Nakayama, MD;  Location: [facility];  Service: Open Heart Surgery;  Laterality: N/A;  . KNEE SURGERY     right  . LEFT HEART CATHETERIZATION WITH CORONARY ANGIOGRAM N/A 07/23/2012   Procedure: LEFT HEART CATHETERIZATION WITH CORONARY ANGIOGRAM;  Surgeon: Leonie Man, MD;  [facility] C[facility];  Service: Cardiovascular;  Laterality: N/A;  .  LEXISCAN MYOVIEW  11/14/2011   Mild-moderate defect seen in Mid Inferolateral and Mid Anterolateral regions-consistant w/ infarct/scar. No significant ischemia demonstrated.  [name] PACEMAKER INSERTION  2001   BS BivICD  . TEE WITHOUT CARDIOVERSION N/A 11/29/2014   Procedure: TRANSESOPHAGEAL ECHOCARDIOGRAM (TEE);  Surgeon: Melrose Nakayama, MD;  Location: [facility];  Service: Open Heart Surgery;  Laterality: N/A;  . TRANSTHORACIC ECHOCARDIOGRAM  07/23/2012   EF 55-60%, normal-mild  . TUBAL LIGATION      Current Medications: Outpatient Medications Prior to Visit  Medication Sig Dispense Refill  . acetaminophen (TYLENOL) 500 MG tablet Take 1,000 mg by mouth every 6 (six) hours  as needed (for pain.).    [name] albuterol (PROVENTIL HFA;VENTOLIN HFA) 108 (90 BASE) MCG/ACT inhaler Inhale 2 puffs into the lungs every 4 (four) hours as needed for wheezing (wheezing).     . carvedilol (COREG) 6.25 MG tablet TAKE ONE TABLET BY MOUTH TWICE DAILY WITH  A  MEAL 60 tablet 2  . cetirizine (ZYRTEC) 10 MG tablet Take 10 mg by mouth daily.     . clopidogrel (PLAVIX) 75 MG tablet Take 1 tablet (75 mg total) by mouth daily. 30 tablet 0  . clotrimazole (MYCELEX) 10 MG troche Take 10 mg by mouth every 4 (four) hours as needed (mouth pain).    [name] EPINEPHrine 0.3 mg/0.3 mL IJ SOAJ injection     . fluorometholone (FML) 0.1 % ophthalmic suspension Place 1 drop into both eyes 2 (two) times daily.    . fluticasone (FLONASE) 50 MCG/ACT nasal spray Place 1 spray into the nose 2 (two) times daily as needed for rhinitis or allergies.     . isosorbide mononitrate (IMDUR) 30 MG 24 hr tablet Take 1 tablet (30 mg total) by mouth daily. 30 tablet 0  . KLOR-CON M20 20 MEQ tablet TAKE ONE TABLET BY MOUTH ONCE DAILY (Patient taking differently: TAKE 20 MEQ BY MOUTH ONCE DAILY) 60 tablet 4  . omeprazole (PRILOSEC) 40 MG capsule Take 1 capsule (40 mg total) by mouth 2 (two) times daily before a meal. 60 capsule 11  . spironolactone (ALDACTONE) 25 MG tablet Take 25 mg by mouth daily.    . SYMBICORT 160-4.5 MCG/ACT inhaler Inhale 2 puffs into the lungs 2 (two) times daily.    [name] torsemide (DEMADEX) 20 MG tablet Take 40 mg by mouth 2 (two) times daily.    [name] triamcinolone cream (KENALOG) 0.1 % Apply 1 application topically daily as needed (applied to affected itchy area(s)).     [name] atorvastatin (LIPITOR) 80 MG tablet Take 1 tablet (80 mg total) by mouth daily at 6 PM. 30 tablet 0   No facility-administered medications prior to visit.      Allergies:   Ivp dye [iodinated diagnostic agents]; Shellfish-derived products; Sulfa antibiotics; Iodine; Cephalexin; and Zithromax [azithromycin]   Social History   Social  History  . Marital status: Widowed    Spouse name: N/A  . Number of children: 5  . Years of education: N/A   Occupational History  . 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 Smoking status: Former Smoker    Packs/day: 0.25    Years: 3.00    Types: Cigarettes    Quit date: 02/11/1968  . Smokeless tobacco: Never Used  . Alcohol use No  . Drug use: No  . Sexual activity: No   Other Topics Concern  . None   Social History Narrative   Widowed last year. She lives  with her two sons.     Family History:  The patient's family history includes Asthma in her sister; Breast cancer in her mother; Diabetes in her maternal grandmother and mother; Glaucoma in her maternal aunt; Heart disease in her maternal aunt and maternal grandmother; Kidney disease in her maternal grandmother.   ROS:   Please see the history of present illness.    ROS All other systems reviewed and are negative.   PHYSICAL EXAM:   VS:  BP 112/75   Pulse 84   Ht 5\' 5"  (1.651 m)   Wt 245 lb 6.4 oz (111.3 kg)   BMI 40.84 kg/m    GEN: Well nourished, well developed, in no acute distress  HEENT: normal  Neck: no JVD, carotid bruits, or masses Cardiac: RRR; no murmurs, rubs, or gallops,no edema  Respiratory:  clear to auscultation bilaterally, normal work of breathing GI: soft, nontender, nondistended, + BS MS: no deformity or atrophy  Skin: warm and dry, no rash Neuro:  Alert and Oriented x 3, Strength and sensation are intact Psych: euthymic mood, full affect  Wt Readings from Last 3 Encounters:  10/31/15 245 lb 6.4 oz (111.3 kg)  10/23/15 252 lb 14.4 oz (114.7 kg)  10/18/15 243 lb 9.6 oz (110.5 kg)      Studies/Labs Reviewed:   EKG:  EKG is not ordered today.    Recent Labs: 12/28/2014: Magnesium 2.0 02/21/2015: TSH 3.677 10/18/2015: Brain Natriuretic Peptide 55.7 10/22/2015: ALT 22; BUN 27; Creatinine, Ser 1.18; Hemoglobin 13.2; Platelets 156; Potassium 4.3; Sodium 140    Lipid Panel    Component Value Date/Time   CHOL 241 (H) 10/23/2015 0414   TRIG 172 (H) 10/23/2015 0414   HDL 40 (L) 10/23/2015 0414   CHOLHDL 6.0 10/23/2015 0414   VLDL 34 10/23/2015 0414   LDLCALC 167 (H) 10/23/2015 0414    Additional studies/ records that were reviewed today include:   CT of head 10/24/2015 IMPRESSION: Stable non contrast CT appearance of the brain with no acute cortically based infarct identified.  Evidence of some small vessel disease in the cerebral white matter.   Transcranial [place] 10/24/2015 Summary: Absent right temporal window limits evaluation of anterior circulation bilaterally. Low normal mean flow velocities in majority of the identified vessels throughout with high pulsatility indexes suggest likely diffuse intracranial atherosclerosis. Prepared and Electronically Authenticated by   Carotid US 10/24/2015 Summary: Study was very technically difficult and limited due to patient body habitus, depth of vessels, and patient anatomy.  Unable to adequately visualize the right internal carotid artery, therefore cannot exclude hemodynamically significant stenosis.  Findings suggest elevated left internal carotid artery stenosis suggestive of 40-59% stenosis.  Vertebral arteries are patent with antegrade flow.   Echo 10/24/2015 LV EF: 55% -   60%  ------------------------------------------------------------------- Indications:      CVA 436.  ------------------------------------------------------------------- History:   PMH:  TIA. Asthma exacerbation. CAD S/P CABG. Chronic diastolic heart failure. OSA.  ------------------------------------------------------------------- Study Conclusions  - Left ventricle: The cavity size was normal. There was mild   concentric hypertrophy. Systolic function was normal. The   estimated ejection fraction was in the range of 55% to 60%. Wall   motion was normal; there were no regional wall motion    abnormalities. Doppler parameters are consistent with abnormal   left ventricular relaxation (grade 1 diastolic dysfunction). - Mitral valve: Calcified annulus. Mildly thickened, mildly   calcified leaflets . - Right ventricle: Pacer wire or catheter noted in right ventricle.  Impressions:  -  No cardiac source of emboli was indentified.  ASSESSMENT:    1. Coronary artery disease involving coronary bypass graft of native heart without angina pectoris   2. Transient cerebral ischemia, unspecified transient cerebral ischemia type   3. Chronic diastolic heart failure ([place])   4. Controlled type 2 diabetes mellitus with complication, without long-term current use of insulin ([place])   5. CKD (chronic kidney disease), stage III   6. OSA on CPAP   7. Morbid obesity, unspecified obesity type ([place])   8. Cardiac resynchronization therapy pacemaker (CRT-P) in place   9. Hyperlipidemia      PLAN:  In order of problems listed above:  1. CAD s/p CABG 11/2014 (LIMA to LAD, SVG to D1, SVG to OM1 and OM2, SVG to PDA): No obvious angina, chest is sore on touch. I do not think this is cardiac in nature. Recent echocardiogram obtained on 10/24/2015 showed EF 55-60%, no regional wall motion abnormality. Would continue observation for now.  2. TIA vs CVA: Per patient still has minimal neurological residual on the left side. CT of the head was negative. Pending upcoming interrogation to check for recurrence of PAF, if confirmed, we'll need to discuss with Dr. [name] regarding stop Plavix and change to NOAC vs Coumadin, I did discuss this with the patient, she is not enthusiastic about starting anticoagulation due to significant bleeding issue from her husband while on Coumadin.  3. HLD: Last lipid panel obtained on 10/23/2015 showed total cholesterol 241, triglyceride 172, HDL 40, LDL 167. Home Pravachol has been switched to Lipitor to recent admission, we'll need to repeat LFT and fasting lipid panel in 6  weeks.  4. DM2: Hemoglobin A1c 6.5 on 10/23/2015.  5. H/o ICM s/p CRT-P with normalization of EF: There is some question whether or not she has atrial fibrillation. I have asked her to send in another remote transmission in 2 weeks, if has atrophic fibrillation, we need to consider Coumadin or NOAC and potentially stop Plavix, although she is very hesitant to start on any systemic anticoagulation as her husband had major bleeding issue on Coumadin.  5.  CKD III: stable on recent labwork   Medication Adjustments/Labs and Tests Ordered: Current medicines are reviewed at length with the patient today.  Concerns regarding medicines are outlined above.  Medication changes, Labs and Tests ordered today are listed in the Patient Instructions below. Patient Instructions  Medications:  STOP taking Pravastatin and start Atorvastatin 80 mg daily.   Labwork:  Your physician recommends that you return for a FASTING lipid profile and Hepatic function panel in 2 months.   Other Instructions:  Remote monitoring is used to monitor your Pacemaker of ICD from home. This monitoring reduces the number of office visits required to check your device to one time per year. It allows [place] to keep an eye on the functioning of your device to ensure it is working properly. Please send remote transmission 2 weeks from today. We will forward the results to Dr. [name].   Follow-Up:  Please keep your upcoming appointment scheduled for January 23, 2016 with Dr. [name] at 9:30 am.  If you need a refill on your cardiac medications before your next appointment, please call your pharmacy.        Hilbert Corrigan, [place]  10/31/2015 3:34 PM    [facility] [address], [place], [place]  [address] Phone: [phone]; Fax: [phone]

## 2015-11-01 ENCOUNTER — Ambulatory Visit: Payer: Commercial Managed Care - HMO | Admitting: Nurse Practitioner

## 2015-11-01 DIAGNOSIS — G4730 Sleep apnea, unspecified: Secondary | ICD-10-CM | POA: Diagnosis not present

## 2015-11-01 DIAGNOSIS — Z8673 Personal history of transient ischemic attack (TIA), and cerebral infarction without residual deficits: Secondary | ICD-10-CM | POA: Diagnosis not present

## 2015-11-01 DIAGNOSIS — Z23 Encounter for immunization: Secondary | ICD-10-CM | POA: Diagnosis not present

## 2015-11-01 DIAGNOSIS — I10 Essential (primary) hypertension: Secondary | ICD-10-CM | POA: Diagnosis not present

## 2015-11-01 DIAGNOSIS — I700 Atherosclerosis of aorta: Secondary | ICD-10-CM | POA: Diagnosis not present

## 2015-11-01 DIAGNOSIS — Z6841 Body Mass Index (BMI) 40.0 and over, adult: Secondary | ICD-10-CM | POA: Diagnosis not present

## 2015-11-01 NOTE — Progress Notes (Signed)
Thanks. I will check on the device transmission.

## 2015-11-12 ENCOUNTER — Telehealth: Payer: Self-pay | Admitting: Cardiovascular Disease

## 2015-11-12 ENCOUNTER — Other Ambulatory Visit: Payer: Self-pay | Admitting: *Deleted

## 2015-11-12 MED ORDER — ROSUVASTATIN CALCIUM 20 MG PO TABS: 20.0000 mg | ORAL_TABLET | Freq: Every day | ORAL | 1 refills | Status: DC

## 2015-11-12 NOTE — Telephone Encounter (Signed)
Pt is calling to inform Dr [name] and CMA that ever since she was prescribed Atorvastatin by Almyra Deforest PA-C on 10/31/15, she is experiencing bilateral leg pain, accompanied with muscle cramps. Pt is taking atorvastatin 80 mg po daily. Pt states that bilateral legs ache when she's exerting and at rest.  Pt states that "they especially ache at night, when I'm trying to go to sleep." Pt feels as if this is statin related.  Pt has no other complaints associated with this med.  Pt has no cardiac complaints at this time.  Pt would like for Dr [name] to advise on decreasing her statin or advising on a different regimen.  Pt states she was intolerant to one statin in the past, but was unable to clearly verbalize which statin it was.  No statin intolerance mentioned in in pts allergies/contraindications in her chart.  Informed the pt that I will route this message to Dr [name] and covering CMA for further review, recommendation, and follow-up with the pt thereafter.  Pt verbalized understanding, agrees with this plan, and gracious for all the assistance provided.

## 2015-11-12 NOTE — Telephone Encounter (Signed)
Hold statin for 30 days, then try rosuvastatin 20 mg daily

## 2015-11-12 NOTE — Telephone Encounter (Signed)
[name] is calling because she is having some real bad leg pain was put on new medication , not sure if that is causing it . Please call   Thanks

## 2015-11-12 NOTE — Telephone Encounter (Signed)
Notified the pt that per Dr [name], he recommends that she discontinue her atorvastatin, and hold off on statins for 30 days, then start taking new statin, rosuvastatin 20 mg po daily.  Discontinued the pts atorvastatin and updated in pts allergies as an intolerance.  Pt request that just a 30 day supply to be sent to [facility], to make sure she tolerates this appropriately.  Pt states if she tolerates this appropriately, she will then call back to the office to request for a 90 day supply to be sent through [place].  Pt verbalized understanding and agrees with this plan.  Pt gracious for all the assistance provided.

## 2015-11-19 IMAGING — CR DG CHEST 2V
3 series · 3 of 3 positions shown · non-contrast
Comparison: 06/25/2014

CLINICAL DATA: Congestive heart failure, cough.

EXAM:
CHEST  2 VIEW

[chest lat (1 of 2)]
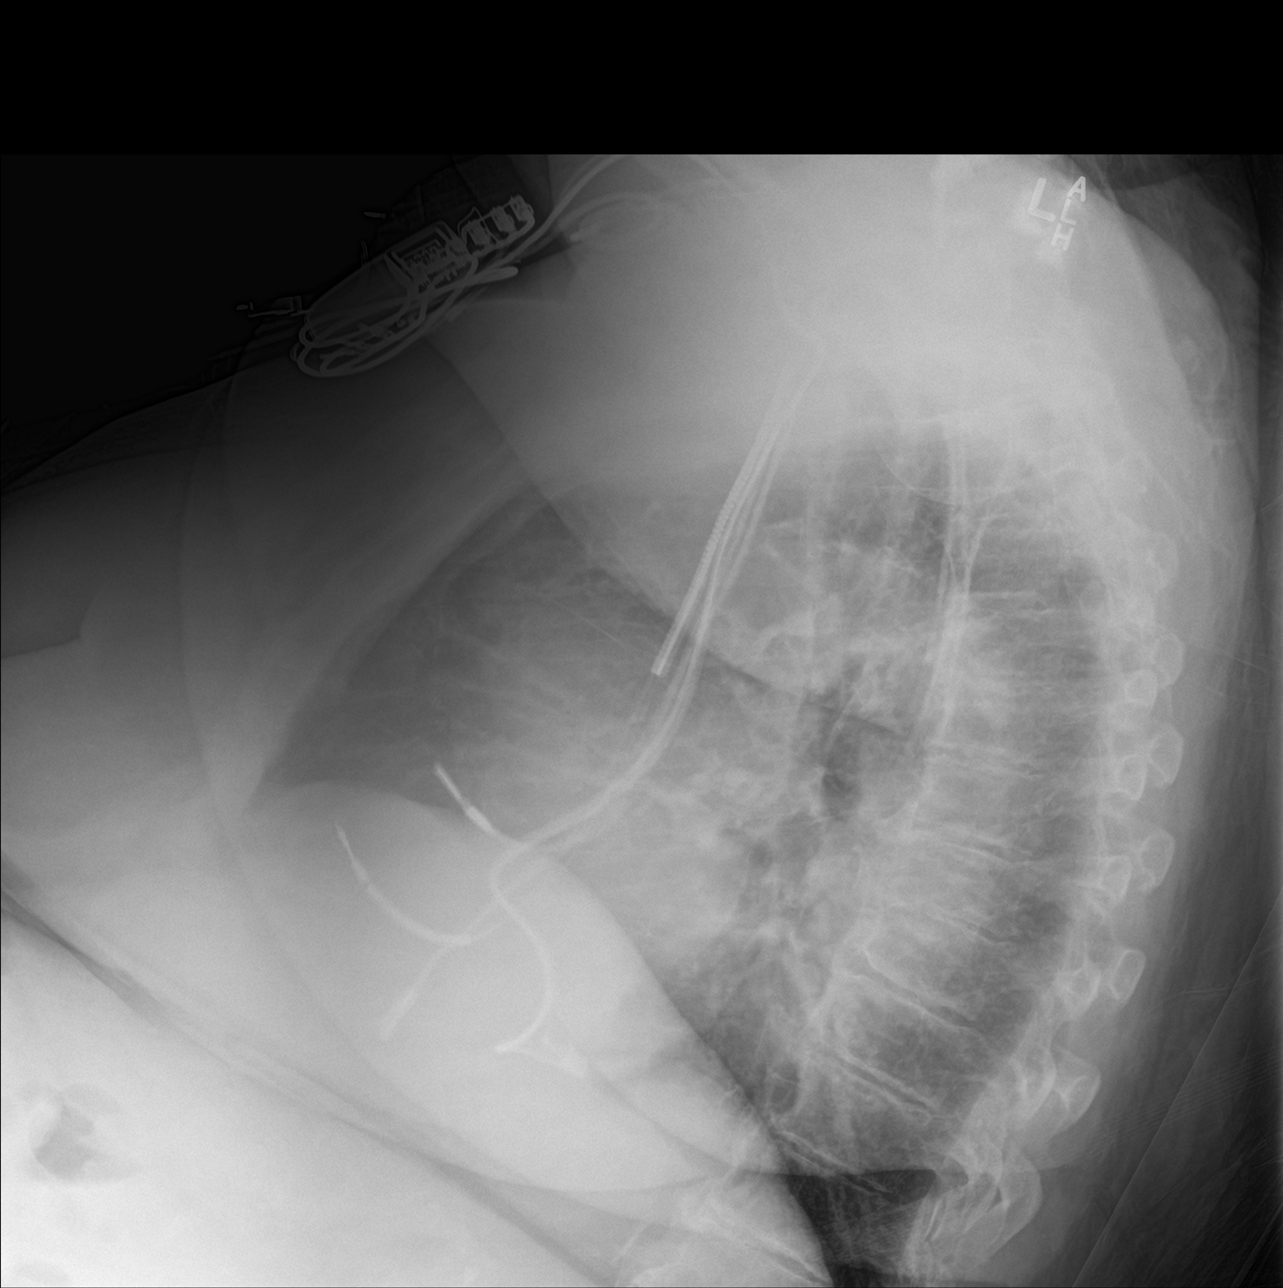

[chest ap]
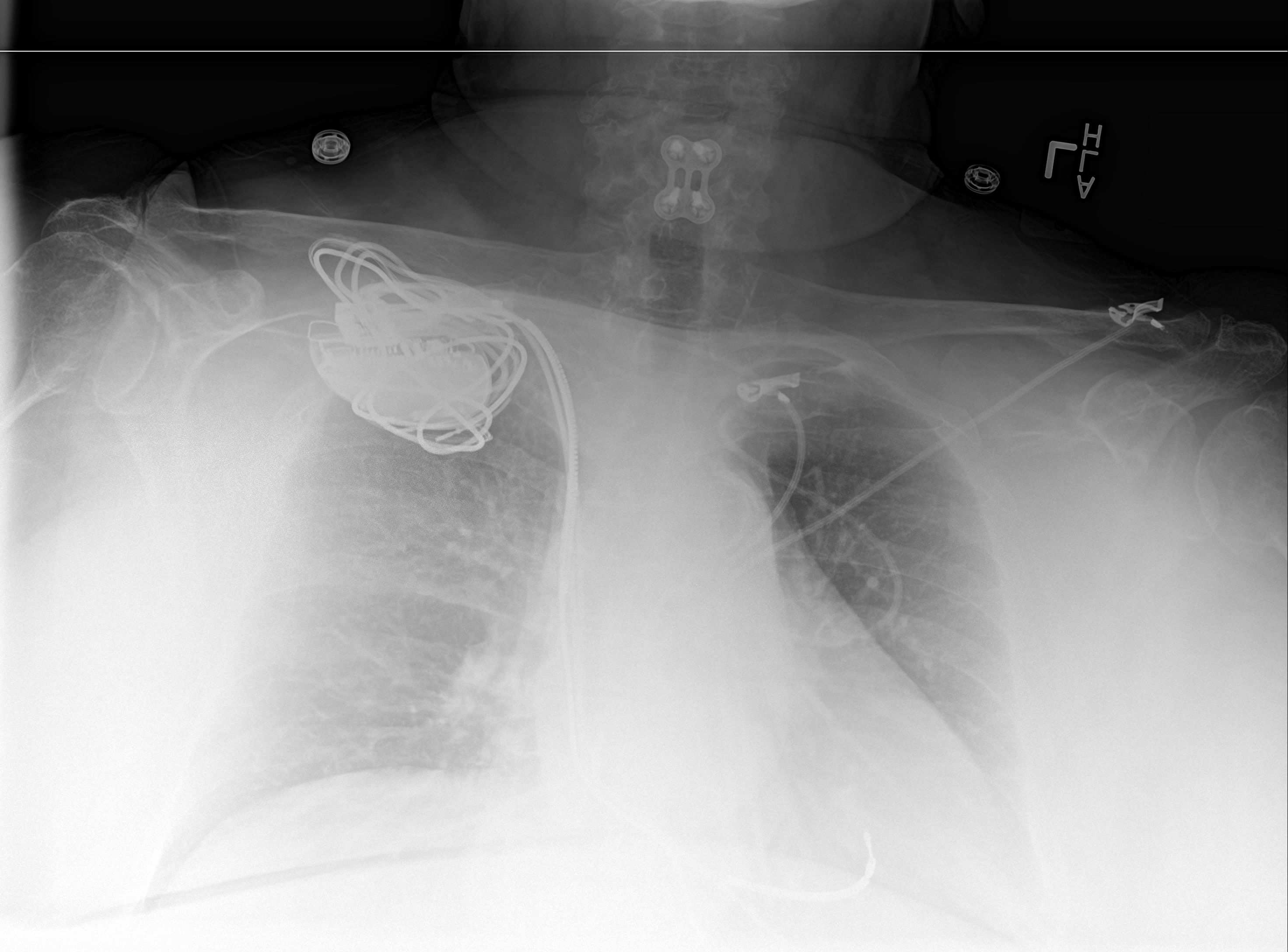

[chest lat (2 of 2)]
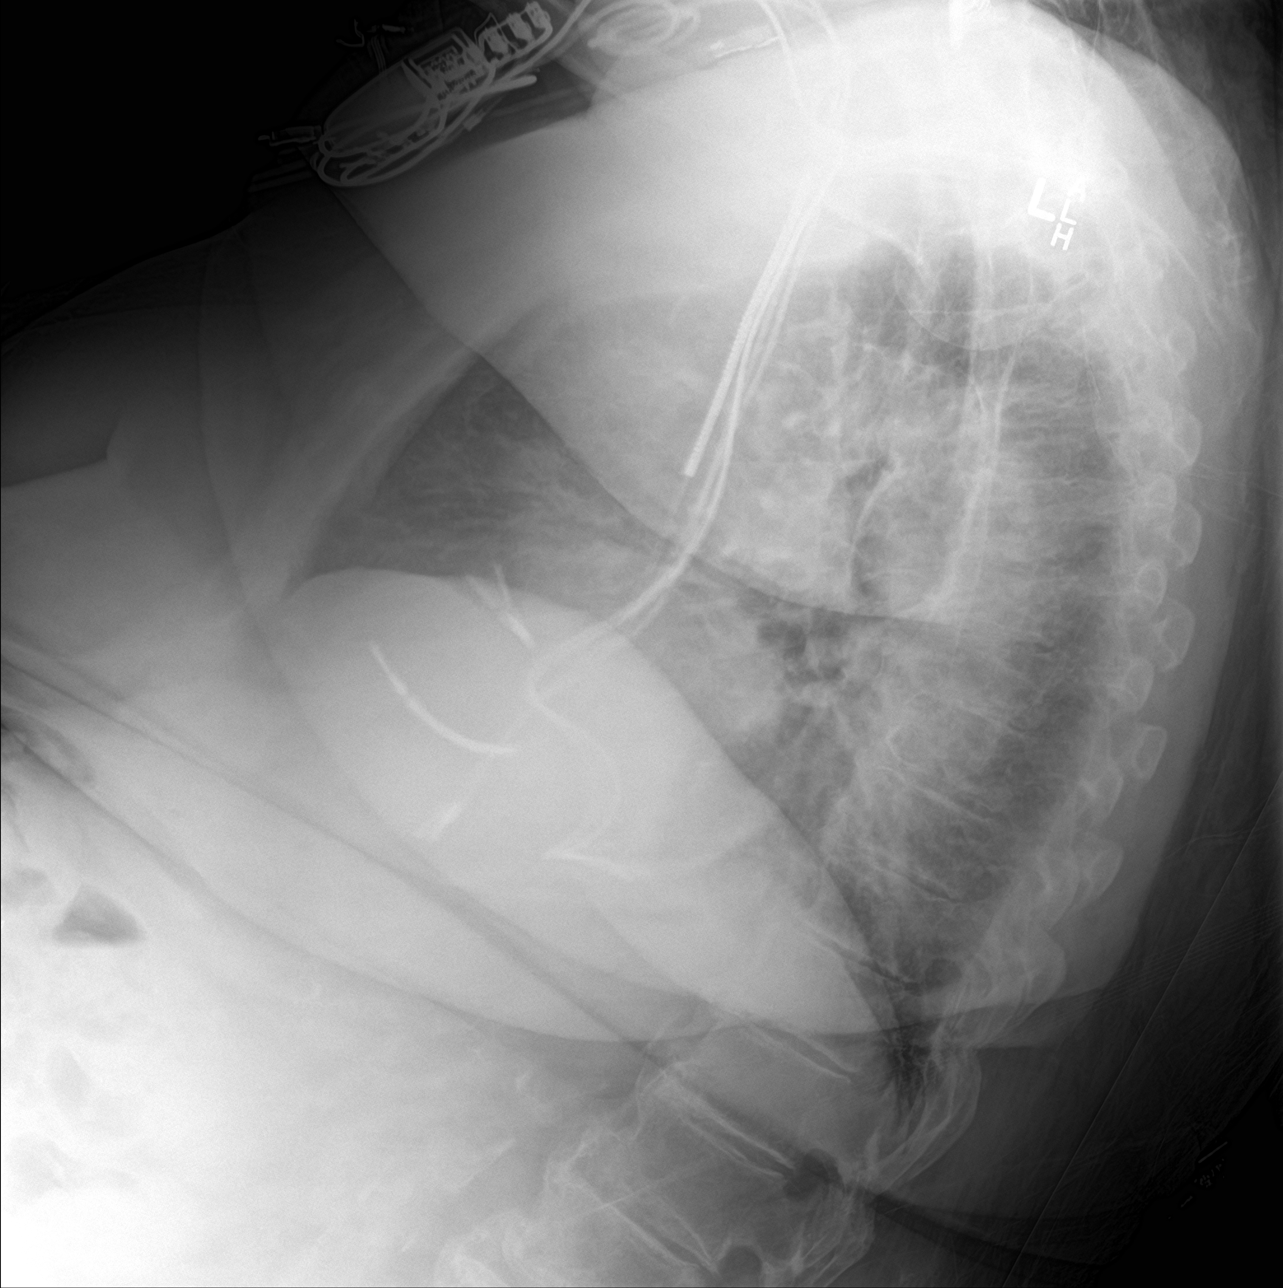

[3 of 3 positions shown; findings below may reference images not displayed]

FINDINGS: Dual lead cardiac pacemaker with battery apparatus within the right
chest wall is stable.

Cardiomediastinal silhouette is enlarged. Mediastinal contours
appear intact.

There is no evidence of focal airspace consolidation, pleural
effusion or pneumothorax.

Osseous structures are without acute abnormality. Lower cervical
spine fusion is noted. Soft tissues are grossly normal.
IMPRESSION: Enlarged cardiac silhouette, otherwise no evidence of acute
cardiopulmonary process.

## 2015-11-25 IMAGING — CR DG CHEST 1V PORT
1 series · 1 of 1 positions shown · non-contrast
Comparison: 11/23/2014.

CLINICAL DATA: Bypass surgery.

EXAM:
PORTABLE CHEST 1 VIEW

[AP]
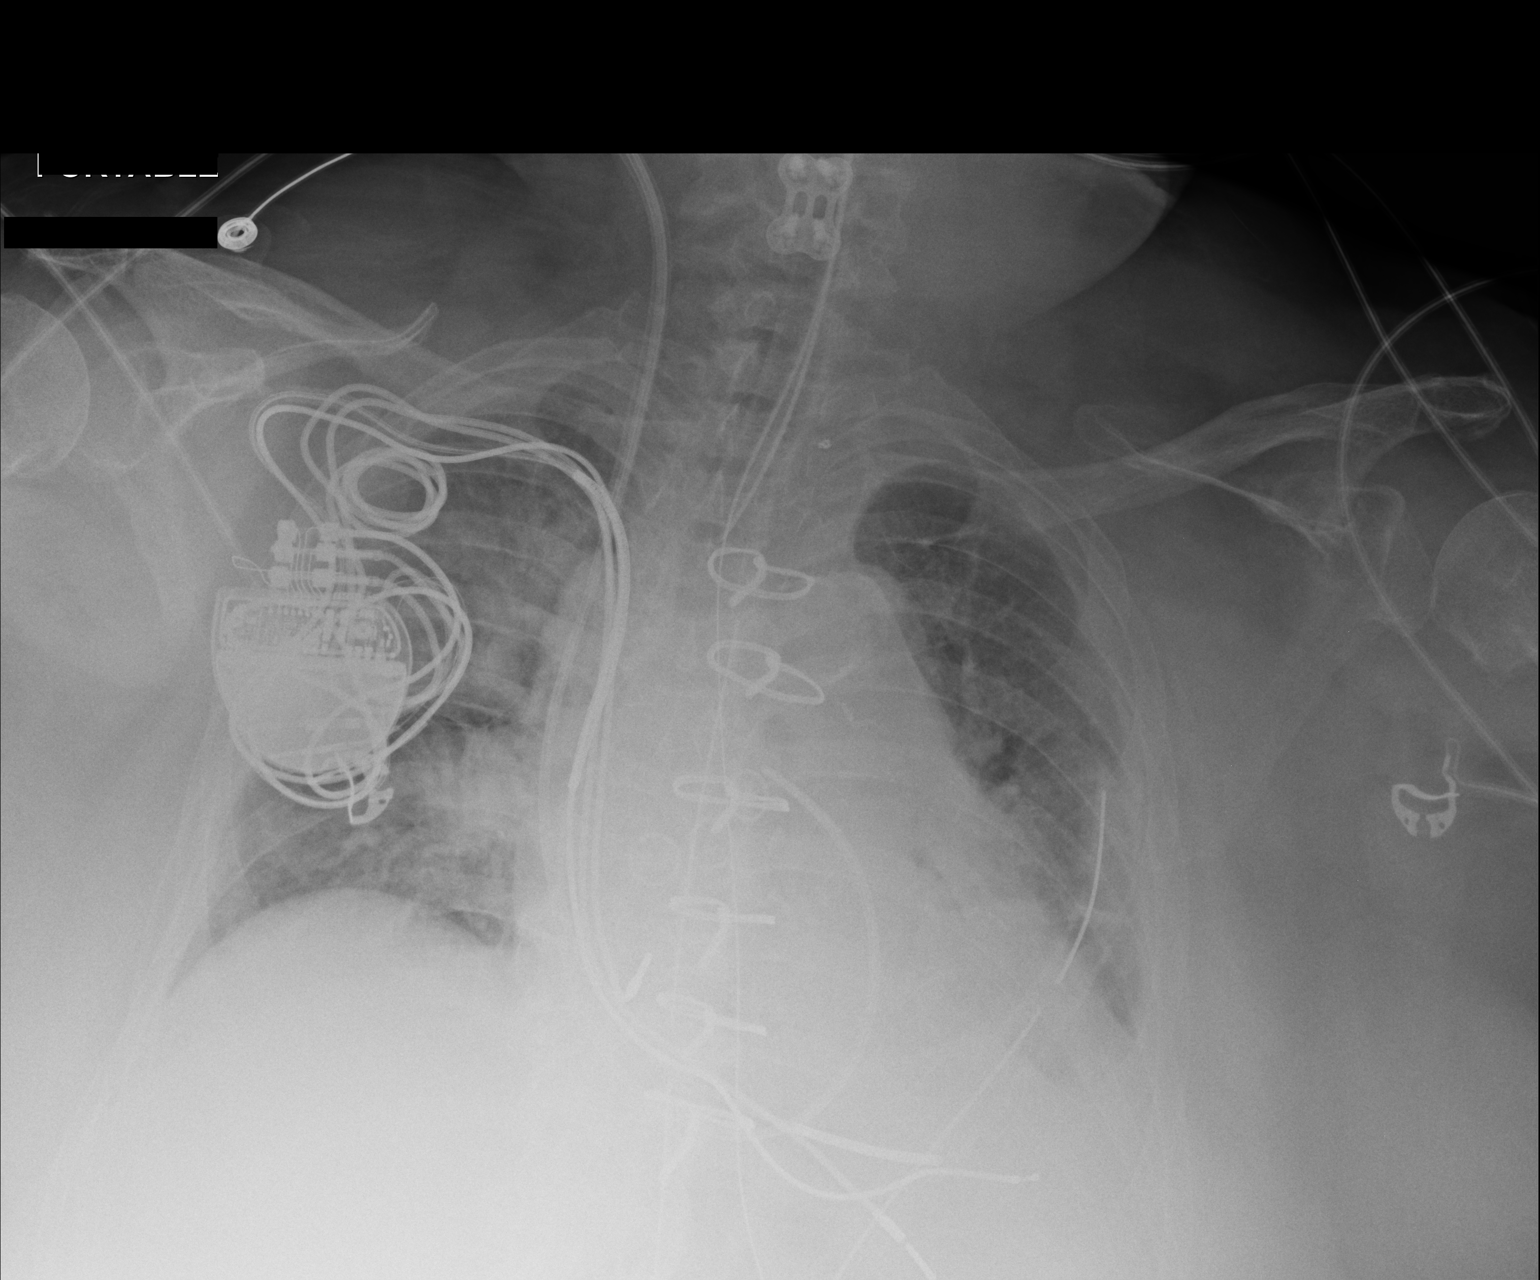

[1 of 1 positions shown; findings below may reference images not displayed]

FINDINGS: The endotracheal to is 3.2 cm above the carina. The right IJ
Swan-Ganz catheter tip is in the proximal right pulmonary artery. An
NG tube is coursing down into the stomach. Mediastinal and
left-sided chest tube are in place. No definite pneumothorax.

Cardiac enlargement with perihilar pulmonary edema, areas of
atelectasis and small left effusion.
IMPRESSION: Postop support apparatus in good position without complicating
features.

Cardiac enlargement, perihilar pulmonary edema, areas of atelectasis
and left pleural effusion. No pneumothorax.

## 2015-11-26 IMAGING — CR DG CHEST 1V PORT
1 series · 1 of 1 positions shown · non-contrast
Comparison: 11/29/2014.

CLINICAL DATA: CABG.

EXAM:
PORTABLE CHEST 1 VIEW

[AP]
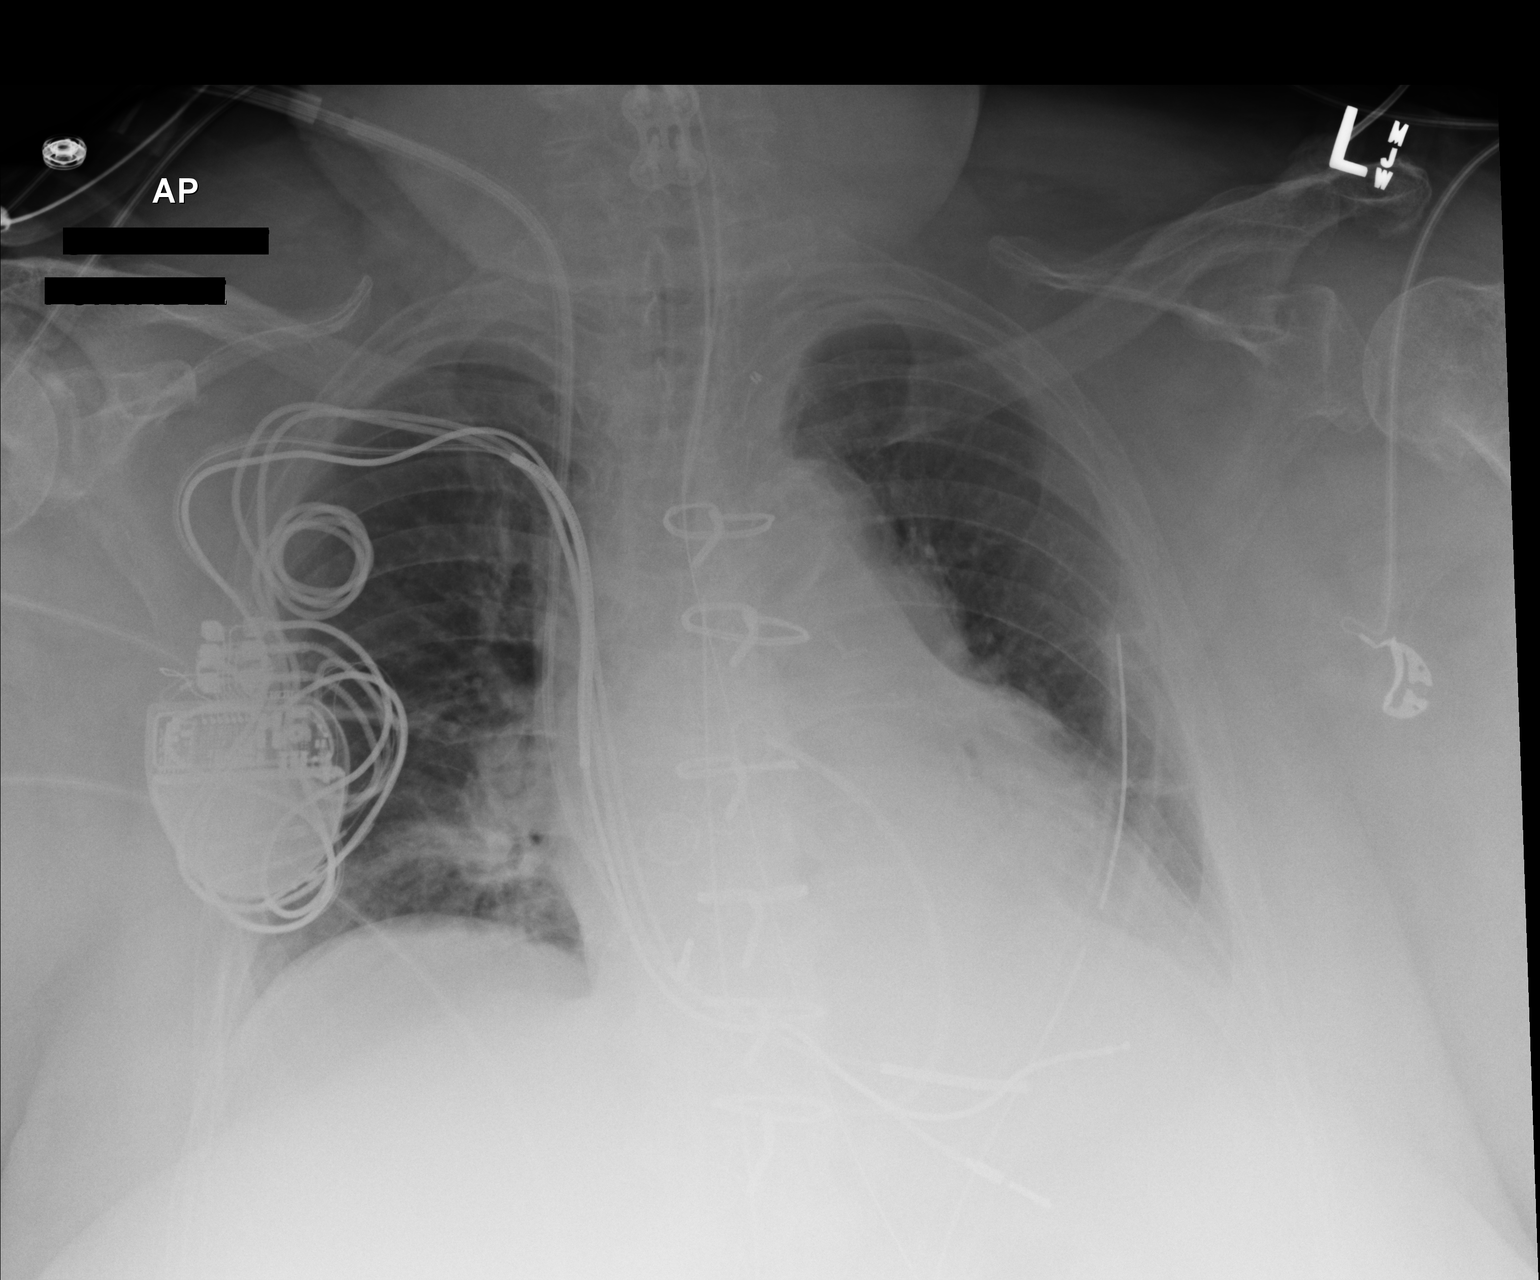

[1 of 1 positions shown; findings below may reference images not displayed]

FINDINGS: Endotracheal tube, NG tube, Swan-Ganz catheter, left chest tube in
stable position. Cardiac pacer stable position. Prior CABG. Stable
cardiomegaly. Interim improvement of bilateral pulmonary edema.
Small left pleural effusion. No pneumothorax.
IMPRESSION: 1. Lines and tubes in stable position.

2. Cardiac pacer in stable position. Prior CABG. Stable
cardiomegaly. Interim improvement of bilateral pulmonary edema.

## 2015-11-27 IMAGING — US IR [PERSON_NAME]/SVC
1 series · 1 of 1 positions shown · non-contrast
Comparison: none

CLINICAL DATA: Pacemaker lead failure, evaluate for central venous
significant stenosis or occlusion. Patient needs a revision with
lead extraction.

[Series 1: ir (id) (id)/(id)/(id) ir · 1 of 1 slices shown]
[im 1/1]
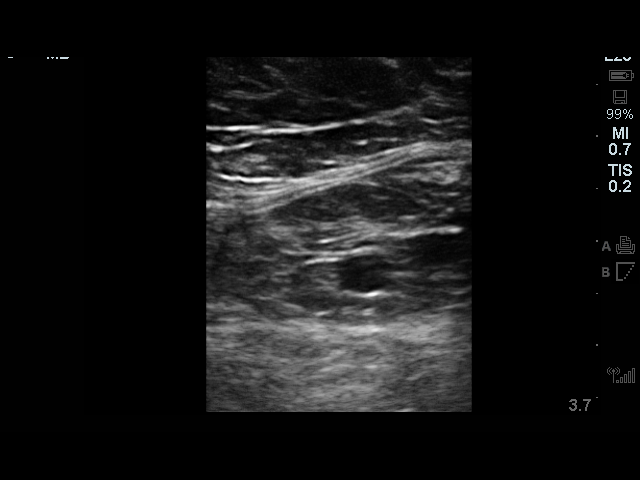

[1 of 1 positions shown; findings below may reference images not displayed]

EXAM:
ULTRASOUND FOR VASCULAR ACCESS

BILATERAL UPPER EXTREMITY AND CENTRAL SVC VENOGRAMS

Radiologist:  Barns, Emiliano

Guidance:  Ultrasound and fluoroscopic

FLUOROSCOPY TIME:  30 seconds, 153 mGy

MEDICATIONS AND MEDICAL HISTORY:
1% lidocaine locally

ANESTHESIA/SEDATION:
None.

CONTRAST:  50 cc Omnipaque 300

COMPLICATIONS:
None immediate

PROCEDURE:
Informed consent was obtained from the patient following explanation
of the procedure, risks, benefits and alternatives. The patient
understands, agrees and consents for the procedure. All questions
were addressed. A time out was performed.

Maximal barrier sterile technique utilized including caps, mask,
sterile gowns, sterile gloves, large sterile drape, hand hygiene,
and ChloraPrep.

under sterile conditions and local anesthesia, ultrasound
micropuncture access performed of the right brachial vein. Guidewire
access advanced followed by the 4 French micro dilator. This was
secured for injection purposes.

In a similar fashion, the left upper extremity was sterilely prepped
and draped. Under sterile conditions and local anesthesia, a
brachial vein was punctured with a micropuncture needle. Guidewire
advanced followed by a 4 French dilator. This was secured for
contrast injection to perform venography.

Right upper extremity: Right brachial, axillary, subclavian, and
innominate veins are patent. No evidence of stenosis or occlusion.
Minimal narrowing of the right innominate vein were the pacer leads
insert.

Left upper extremity: Left brachial vein is likely duplicated. Left
axillary, subclavian, and innominate veins are patent. Negative for
thrombus, occlusion or significant stenosis.

Central SVC is patent.
IMPRESSION: Patent upper extremity venograms and central veins.

No significant central stenosis, thrombus or occlusion.

## 2015-11-27 IMAGING — DX DG CHEST 1V PORT
1 series · 1 of 1 positions shown · non-contrast
Comparison: 11/30/2014.

CLINICAL DATA: CABG.

EXAM:
PORTABLE CHEST 1 VIEW

[chest ap]
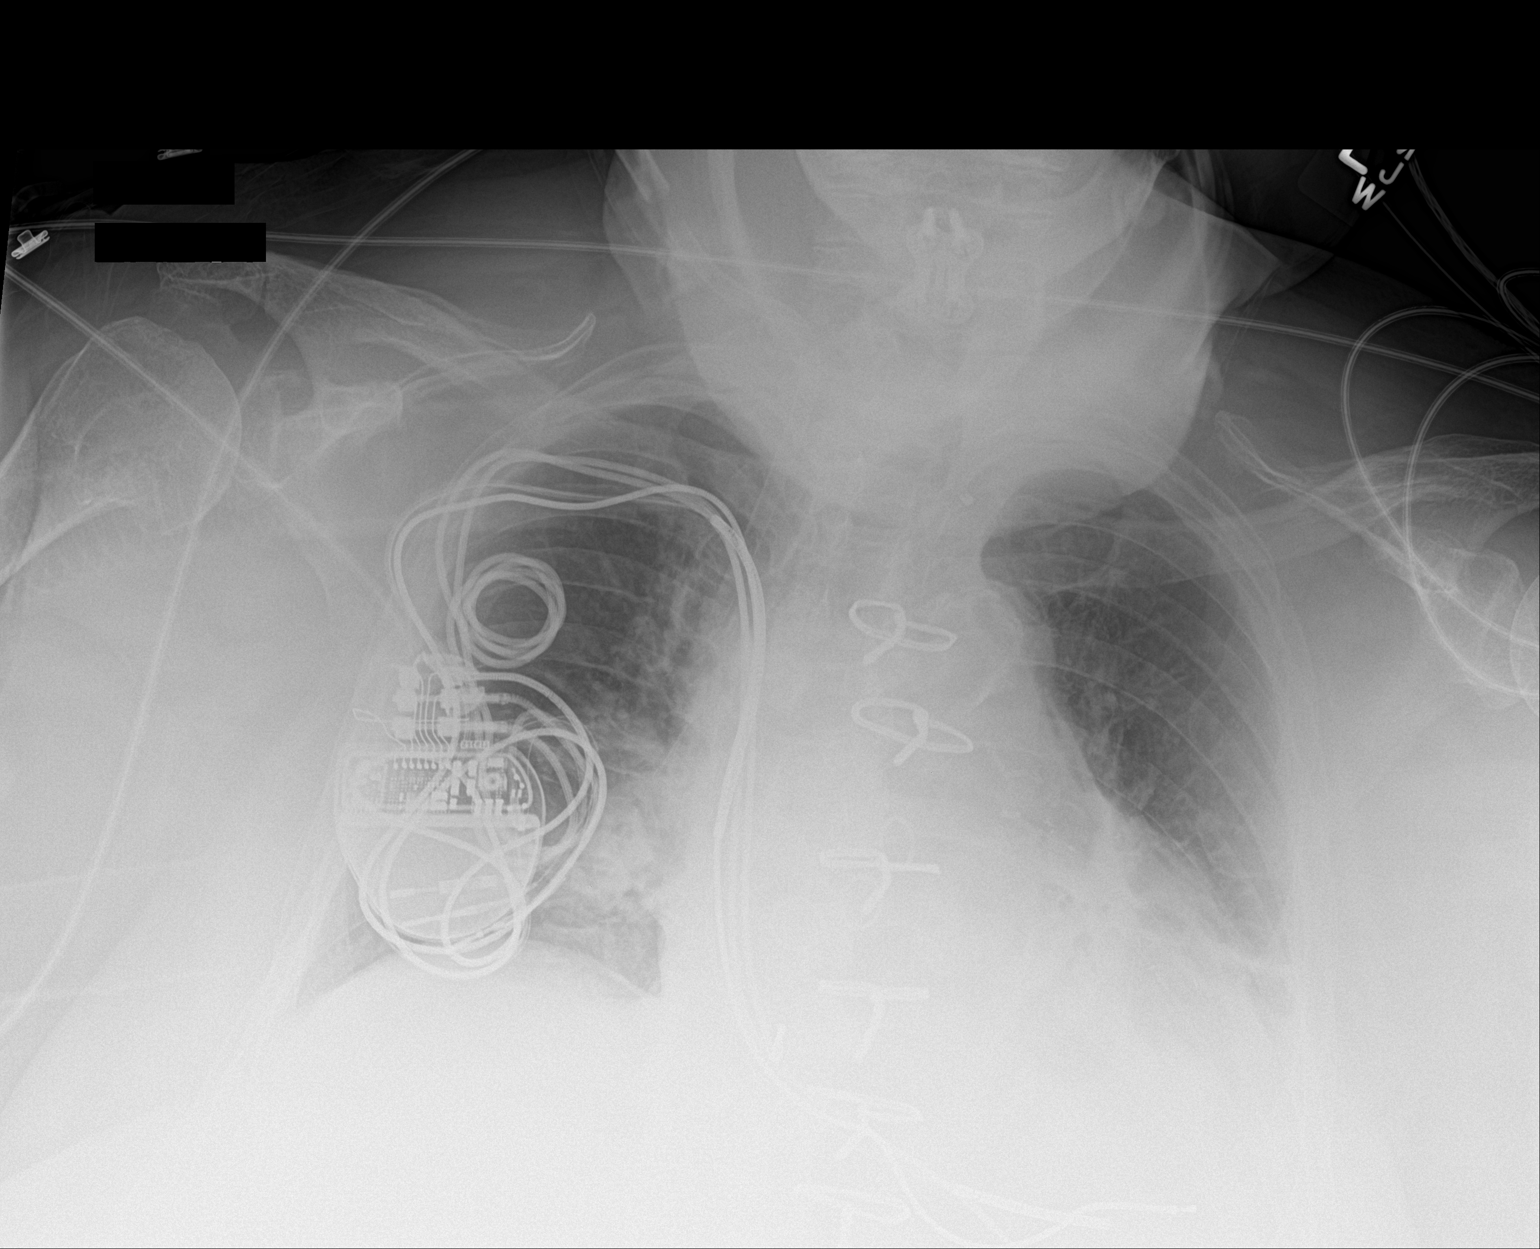

[1 of 1 positions shown; findings below may reference images not displayed]

FINDINGS: Interim removal of endotracheal tube, NG tube, Swan-Ganz catheter,
mediastinal drainage catheter, left chest tube. Cardiac pacer in
stable position prior CABG. Cardiomegaly. Persistent bibasilar
atelectasis. Small left pleural effusion cannot be excluded P No
pneumothorax. Prior cervical spine fusion .
IMPRESSION: 1. Interim removal of lines and tubes.  No pneumothorax.
2. Cardiac pacer in stable position . Prior CABG. Heart size stable.
3. Low lung volumes with bibasilar atelectasis. Small left pleural
effusion cannot be excluded .

## 2015-11-27 IMAGING — XA IR [PERSON_NAME]/SVC
1 series · 12 of 22 positions shown · non-contrast
Comparison: none

CLINICAL DATA: Pacemaker lead failure, evaluate for central venous
significant stenosis or occlusion. Patient needs a revision with
lead extraction.

[Series 1: run · 12 of 22 slices shown]
[im 1/22]
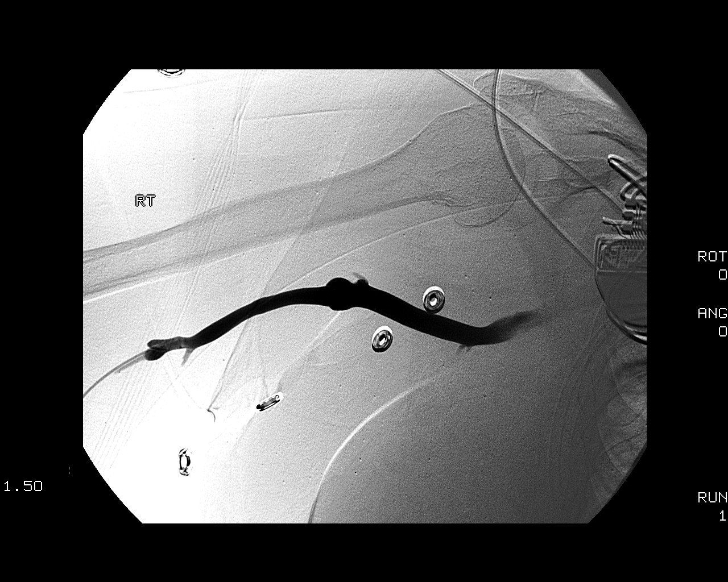
[im 3/22]
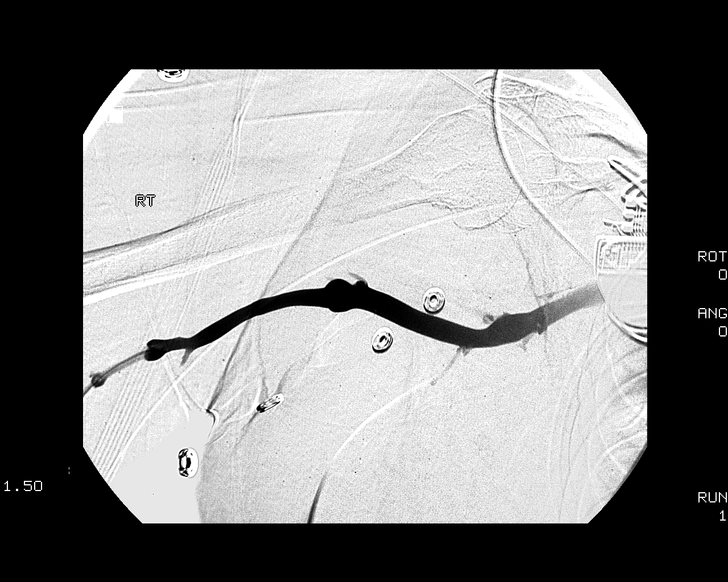
[im 5/22]
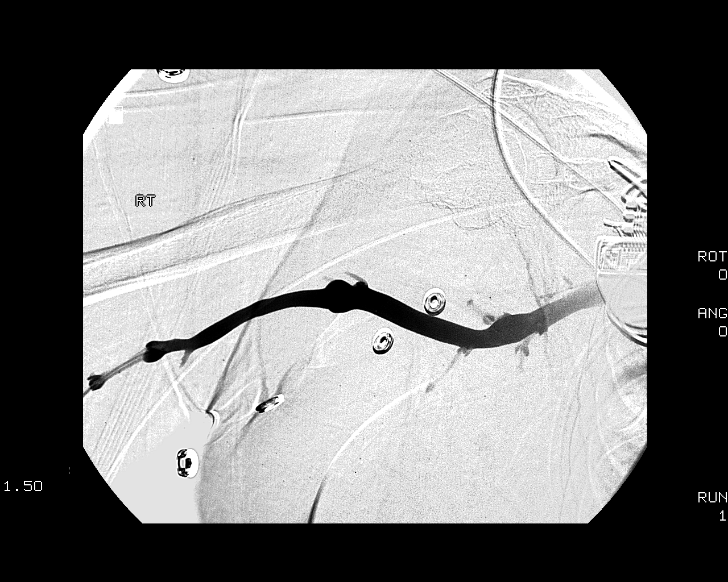
[im 7/22]
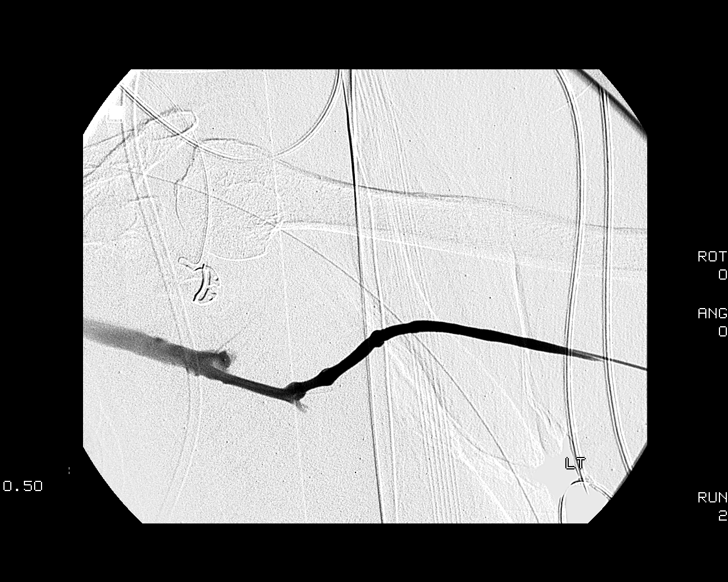
[im 9/22]
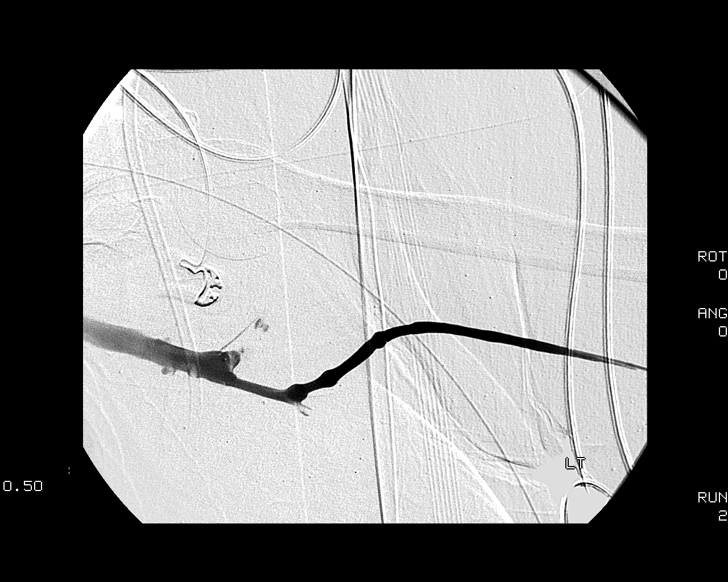
[im 11/22]
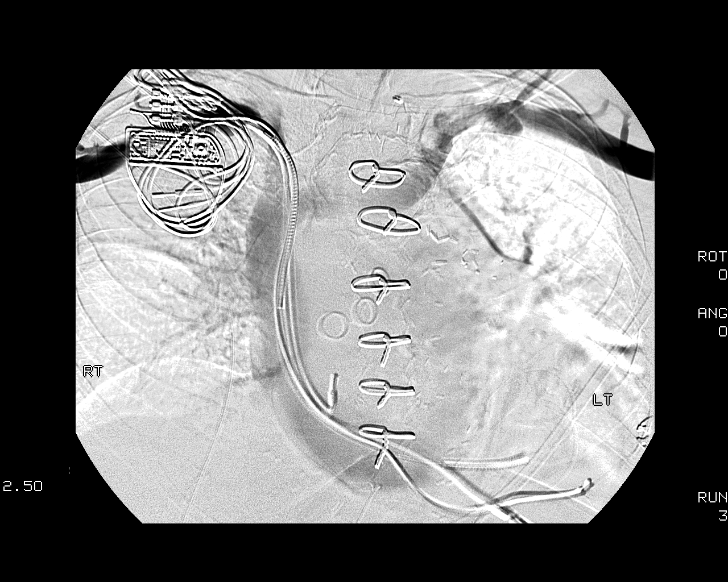
[im 12/22]
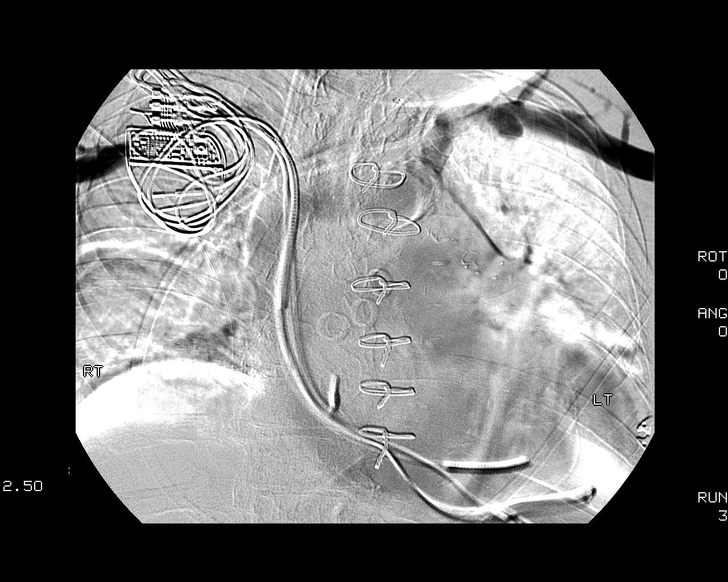
[im 14/22]
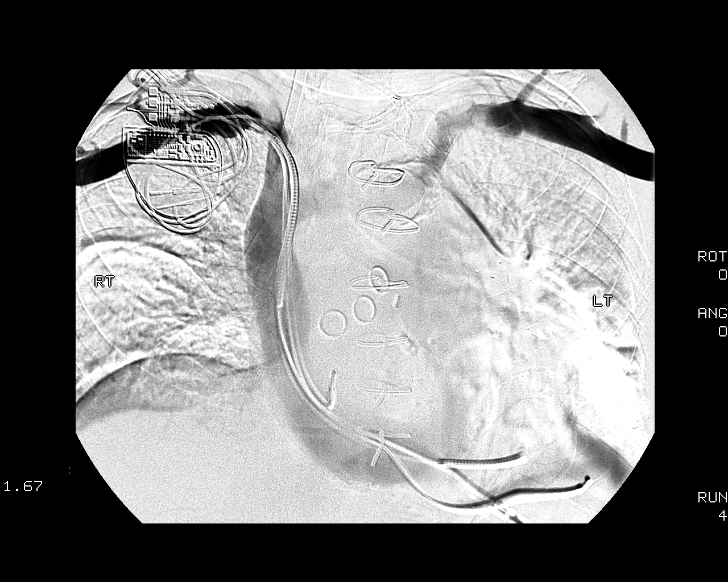
[im 16/22]
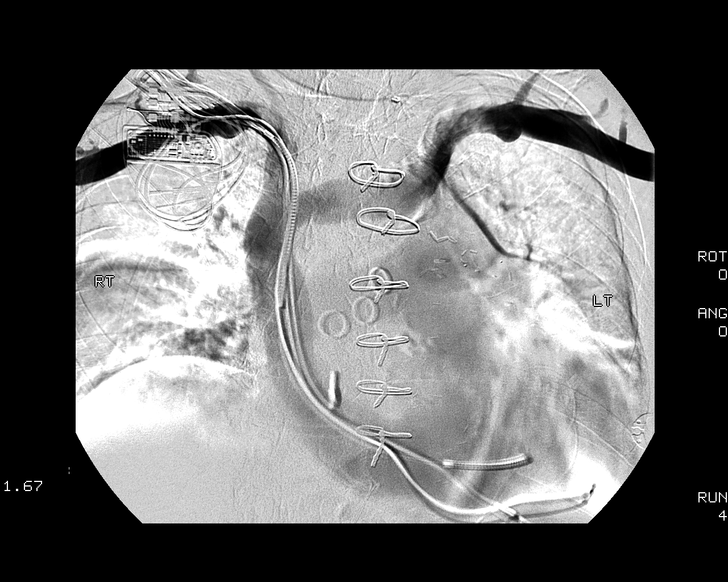
[im 18/22]
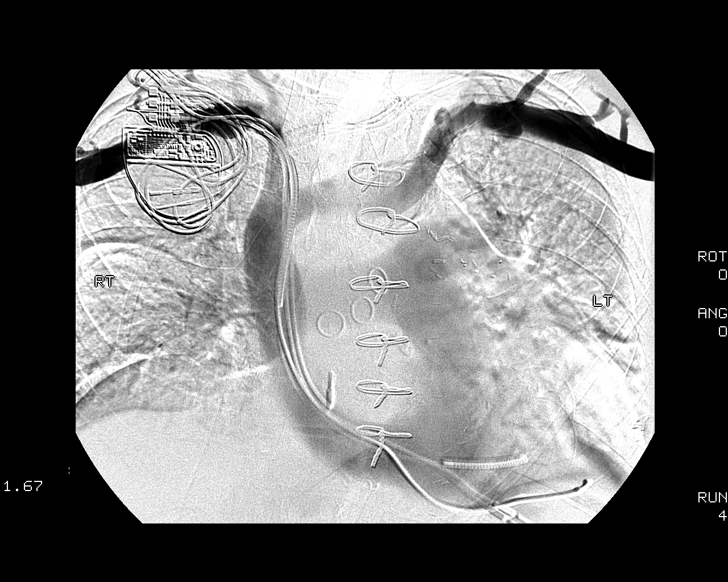
[im 20/22]
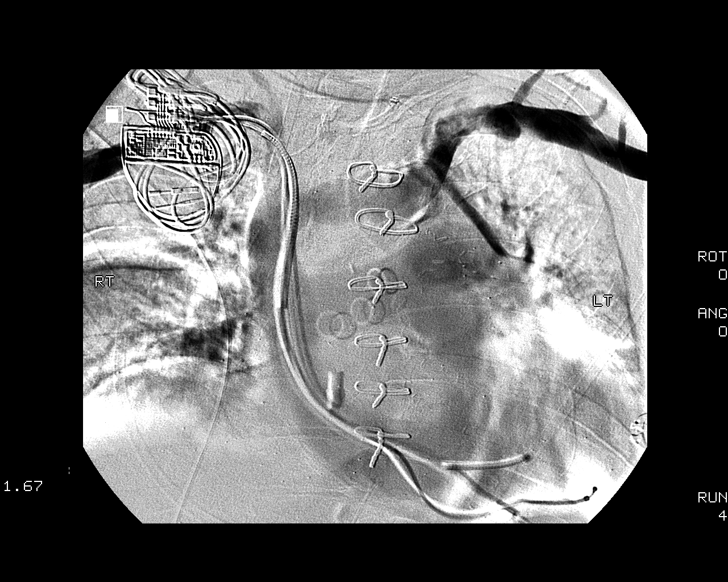
[im 22/22]
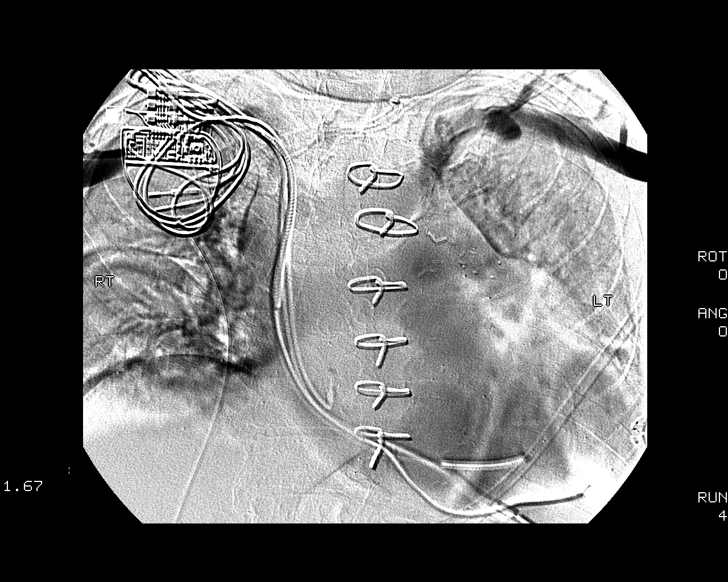

[12 of 22 positions shown; findings below may reference images not displayed]

EXAM:
ULTRASOUND FOR VASCULAR ACCESS

BILATERAL UPPER EXTREMITY AND CENTRAL SVC VENOGRAMS

Radiologist:  Barns, Emiliano

Guidance:  Ultrasound and fluoroscopic

FLUOROSCOPY TIME:  30 seconds, 153 mGy

MEDICATIONS AND MEDICAL HISTORY:
1% lidocaine locally

ANESTHESIA/SEDATION:
None.

CONTRAST:  50 cc Omnipaque 300

COMPLICATIONS:
None immediate

PROCEDURE:
Informed consent was obtained from the patient following explanation
of the procedure, risks, benefits and alternatives. The patient
understands, agrees and consents for the procedure. All questions
were addressed. A time out was performed.

Maximal barrier sterile technique utilized including caps, mask,
sterile gowns, sterile gloves, large sterile drape, hand hygiene,
and ChloraPrep.

under sterile conditions and local anesthesia, ultrasound
micropuncture access performed of the right brachial vein. Guidewire
access advanced followed by the 4 French micro dilator. This was
secured for injection purposes.

In a similar fashion, the left upper extremity was sterilely prepped
and draped. Under sterile conditions and local anesthesia, a
brachial vein was punctured with a micropuncture needle. Guidewire
advanced followed by a 4 French dilator. This was secured for
contrast injection to perform venography.

Right upper extremity: Right brachial, axillary, subclavian, and
innominate veins are patent. No evidence of stenosis or occlusion.
Minimal narrowing of the right innominate vein were the pacer leads
insert.

Left upper extremity: Left brachial vein is likely duplicated. Left
axillary, subclavian, and innominate veins are patent. Negative for
thrombus, occlusion or significant stenosis.

Central SVC is patent.
IMPRESSION: Patent upper extremity venograms and central veins.

No significant central stenosis, thrombus or occlusion.

## 2015-11-29 DIAGNOSIS — I2510 Atherosclerotic heart disease of native coronary artery without angina pectoris: Secondary | ICD-10-CM | POA: Diagnosis not present

## 2015-11-29 DIAGNOSIS — J4520 Mild intermittent asthma, uncomplicated: Secondary | ICD-10-CM | POA: Diagnosis not present

## 2015-11-29 DIAGNOSIS — E119 Type 2 diabetes mellitus without complications: Secondary | ICD-10-CM | POA: Diagnosis not present

## 2015-11-29 DIAGNOSIS — I5032 Chronic diastolic (congestive) heart failure: Secondary | ICD-10-CM | POA: Diagnosis not present

## 2015-11-29 DIAGNOSIS — I10 Essential (primary) hypertension: Secondary | ICD-10-CM | POA: Diagnosis not present

## 2015-11-29 DIAGNOSIS — N183 Chronic kidney disease, stage 3 (moderate): Secondary | ICD-10-CM | POA: Diagnosis not present

## 2015-11-29 DIAGNOSIS — I5033 Acute on chronic diastolic (congestive) heart failure: Secondary | ICD-10-CM | POA: Diagnosis not present

## 2015-11-29 DIAGNOSIS — E1122 Type 2 diabetes mellitus with diabetic chronic kidney disease: Secondary | ICD-10-CM | POA: Diagnosis not present

## 2015-11-29 IMAGING — CR DG CHEST 1V PORT
1 series · 1 of 1 positions shown · non-contrast
Comparison: Portable exam 7005 hours compared to 12/01/2014

CLINICAL DATA: Post CABG 11/29/2014, mid sternal chest pain and
shortness of breath, hypertension, asthma, diabetes mellitus

EXAM:
PORTABLE CHEST 1 VIEW

[AP]
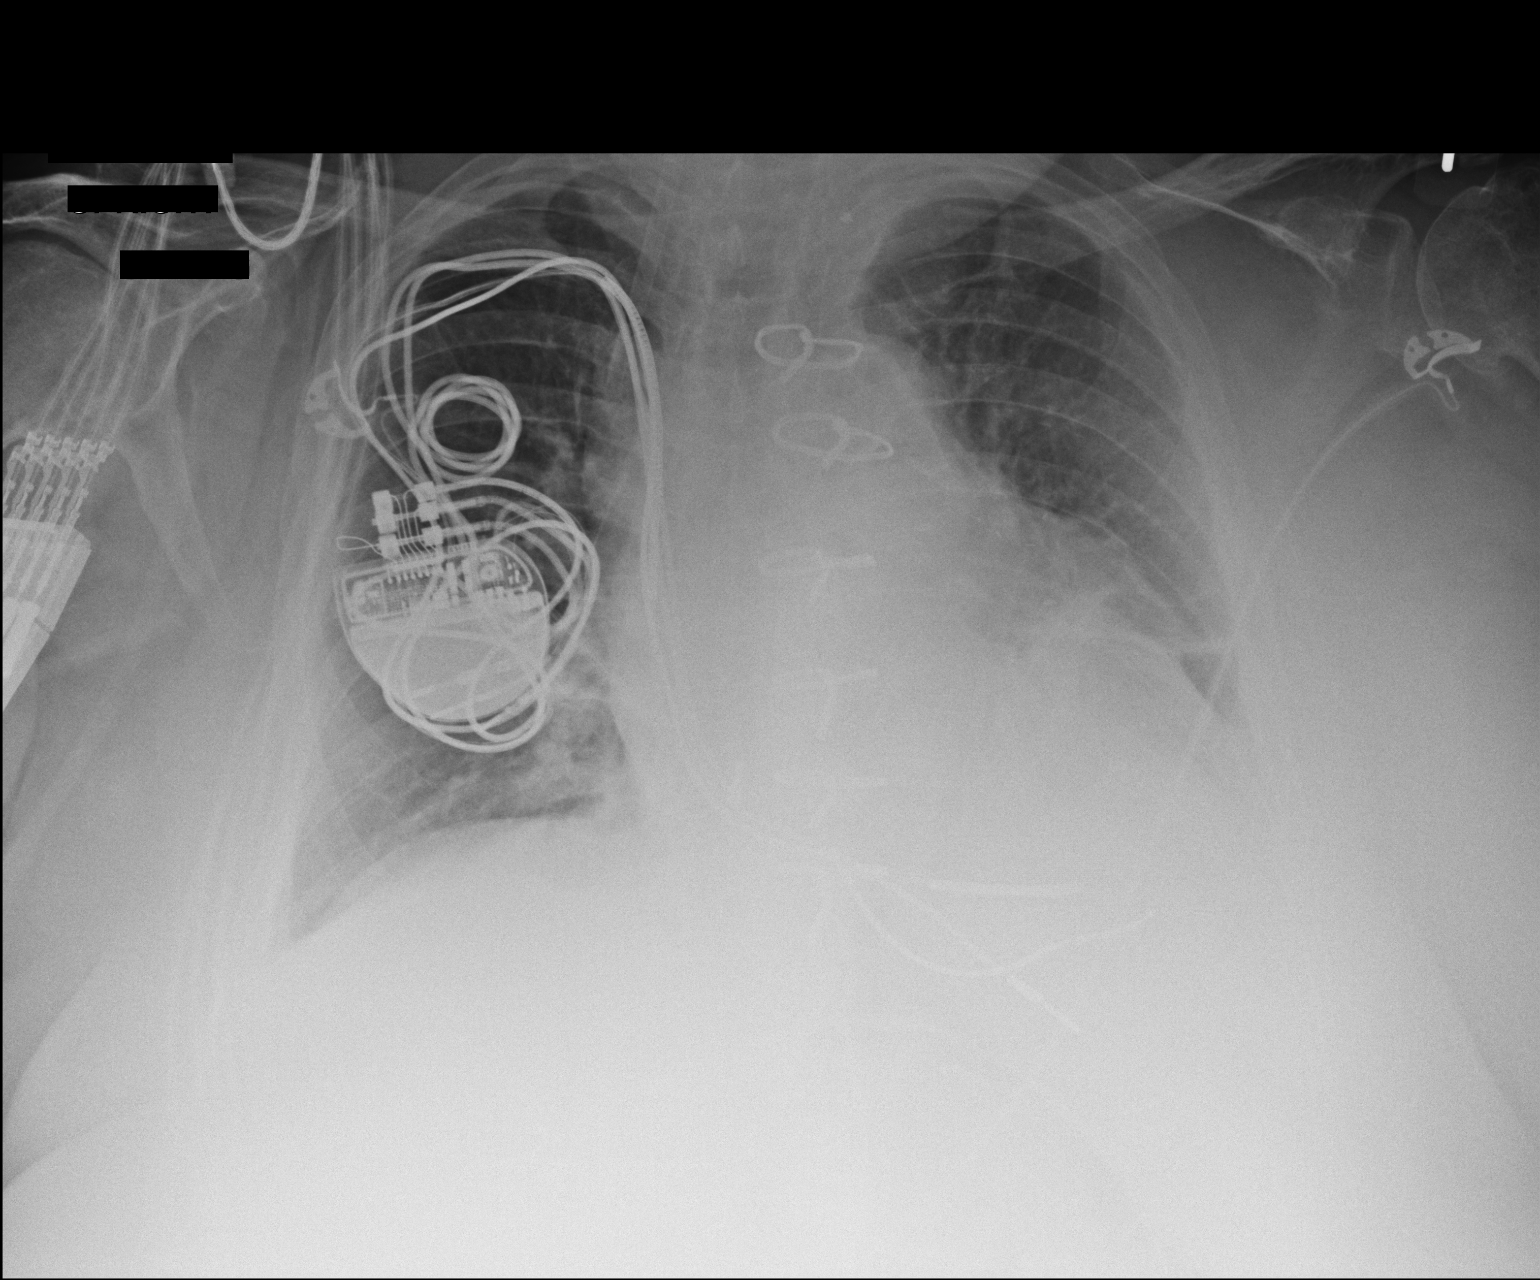

[1 of 1 positions shown; findings below may reference images not displayed]

FINDINGS: RIGHT subclavian pacemaker leads project over RIGHT atrium and RIGHT
ventricle.

RIGHT jugular central venous catheter unchanged tip projecting over
confluence of SVC.

Enlargement of cardiac silhouette post CABG.

Mediastinal contours stable with atherosclerotic calcification and
elongation of thoracic aorta.

Subsegmental atelectasis LEFT base.

Lungs otherwise clear.

No pleural effusion or pneumothorax.
IMPRESSION: Enlargement of cardiac silhouette post CABG and pacemaker.

Persistent subsegmental atelectasis LEFT base.

## 2015-11-30 ENCOUNTER — Other Ambulatory Visit: Payer: Self-pay

## 2015-11-30 IMAGING — DX DG CHEST 1V PORT
1 series · 1 of 1 positions shown · non-contrast
Comparison: 12/03/2014

CLINICAL DATA: Status post coronary bypass grafting

EXAM:
PORTABLE CHEST - 1 VIEW

[chest ap]
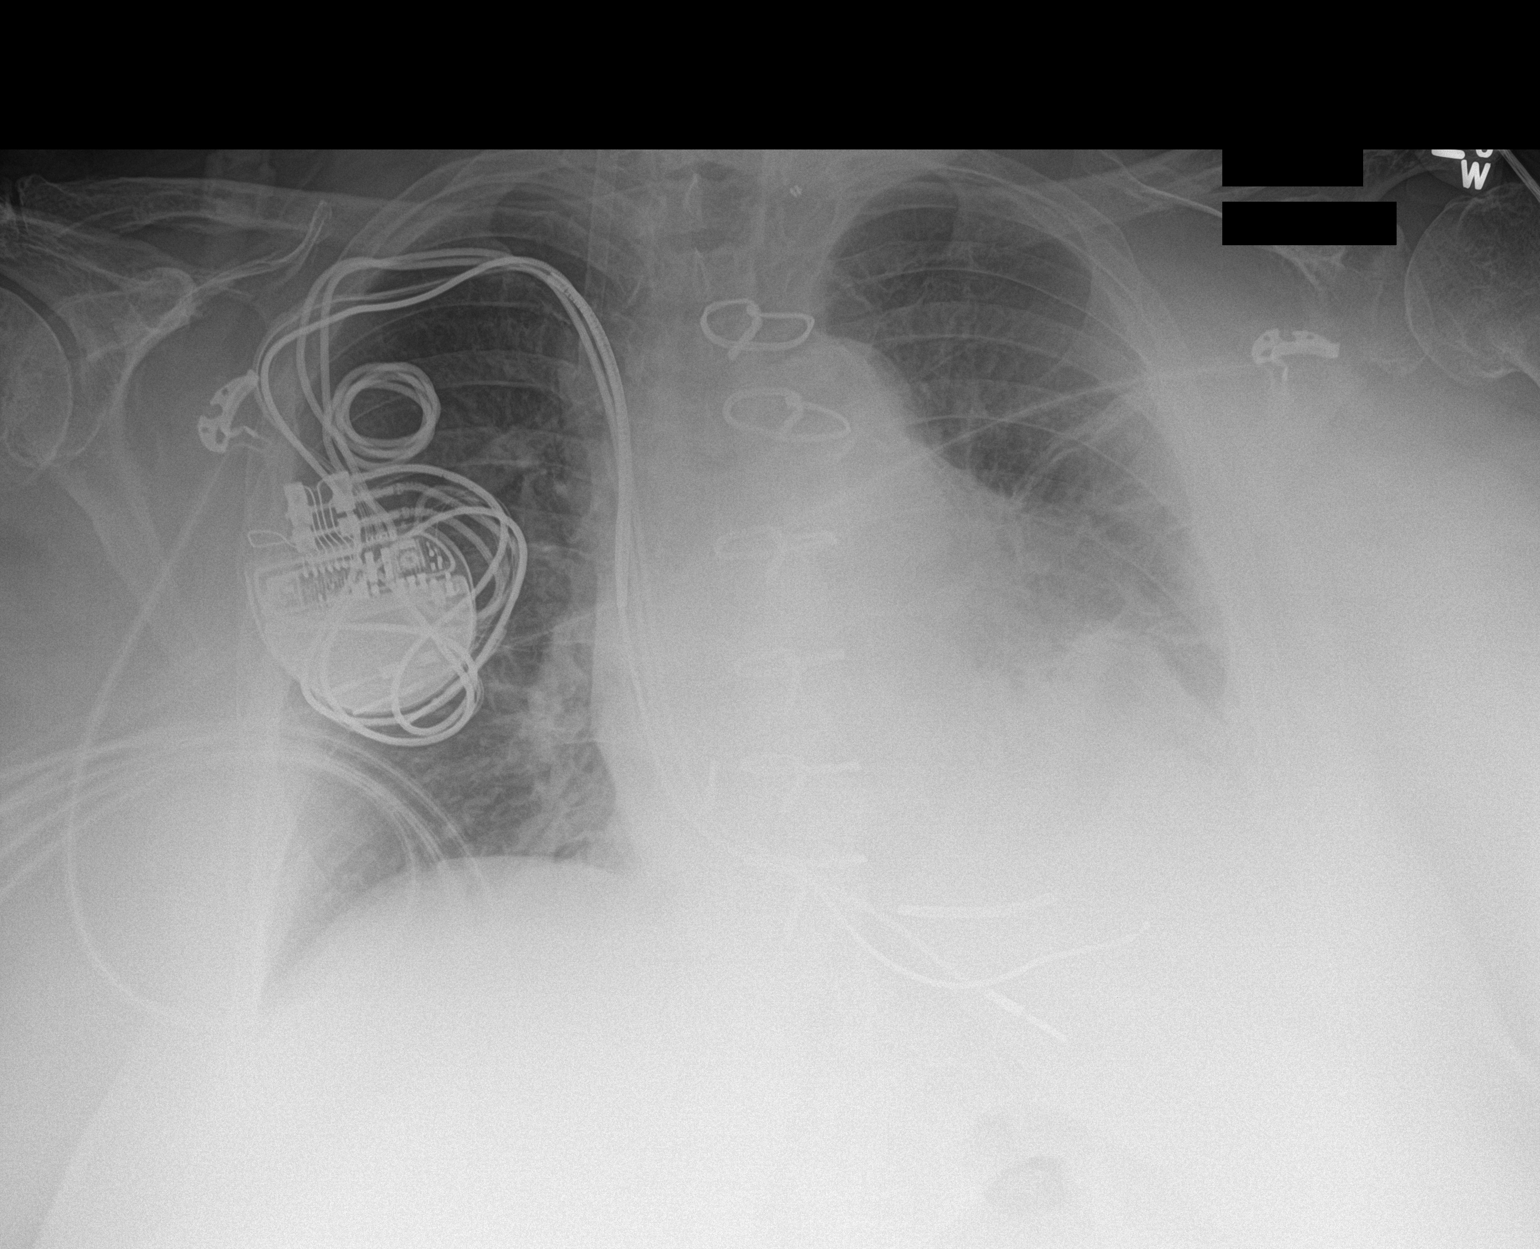

[1 of 1 positions shown; findings below may reference images not displayed]

FINDINGS: Cardiac shadow is enlarged but stable. Postoperative changes are
again seen. A defibrillator is again noted and stable. The right
jugular sheath is again noted. Persistent left basilar atelectatic
changes are seen. No new focal abnormality is noted.
IMPRESSION: No change from the prior exam.

## 2015-11-30 NOTE — Patient Outreach (Signed)
[place] [facility]) Care Management  11/30/2015  [name] [name] 06, 1947 [id number]   Telephone Screen  Referral Date: 11/29/15 Referral Source: MD office(Dr. Inda Merlin) Referral Reason: "HF,DM, HTN, RF, patient needs nurse to assist with disease management"    Outreach attempt # 1 to patient. Patient reached and screening completed.   Social: Patient resides in her home. She states that she lives with her two sons and their families. She voices that she is independent with her ADLs/IADLs. She drives herself to her MD appts She report her last fall was in March of this year in which she broke her ankle. DME in the home include walker, shower chair,BP monitor and cbg meter.   Conditions: Patient has PMH of DM,HTN, HF, GERD, CKD stage 3, HLD, cardiomyopathy, sleep apnea, pacemaker, CABG and obesity. Patient reports she is "borderline diabetic" as she is not currently taking any meds. Last A1C was 6.5(Sept 2017). Patient states she received a new meter from her insurance company to monitor her blood sugars but doesn't know how to use the meter. She has BP monitor in the home and checks BP daily. She voices that BP normally runs in the 120s. Patient also has scales in the home and weighs qday. She reports some BLE edema at times but states that the "two fluid pills help with that." Patient self reported that she was in the hospital last month after suffering from "a mild TIA." She denies any residual effects. She reports ongoing issues with ankle that was broken earlier this year from a  Fall. She had foot in cast for 8 weeks but has since had a lot of issues with edema and pain. She has been referred to specialist for further eval.   Medications: Patient reported taking less than 15 meds. She denies any issues with affording meds at  this time. She gets most of her meds via [facility] mail order. Patient has [employer] and Medicaid coverage.  Appointments: She saw cardiologist(Dr.  Croitoru-10/31/15. She has the following upcoming appts 12/13/15-[facility], 12/18/15-Dr. Duda(orthopedic) and 12/24/15-Dr. Sood(pulmonologist).   Advance Directives: She voices she does not have any and declined further info at this time.   Consent: [facility] services reviewed and discussed. Patient requesting nurse to assist her with managing her chronic conditions especially DM and HF. She voices she would like nurse to come out and help her learn how to work her cbg meter. Patient gave verbal consent for [facility] services.   Plan: RN CM will notify [facility] administrative assistant of case status. RN CM will send [facility] community nurse referral for further in home eval/assessment and education/support. RN CM provided patient with [facility] contact info for future reference.  Enzo Montgomery, RN,BSN,CCM [place] Management Telephonic Care Management Coordinator Direct Phone: [phone] Toll Free: [phone] Fax: [phone]

## 2015-12-01 ENCOUNTER — Other Ambulatory Visit: Payer: Self-pay | Admitting: Cardiovascular Disease

## 2015-12-01 IMAGING — CR DG CHEST 2V
2 series · 2 of 2 positions shown · non-contrast
Comparison: December 04, 2014

CLINICAL DATA: Status post coronary artery bypass grafting

EXAM:
CHEST  2 VIEW

[chest pa]
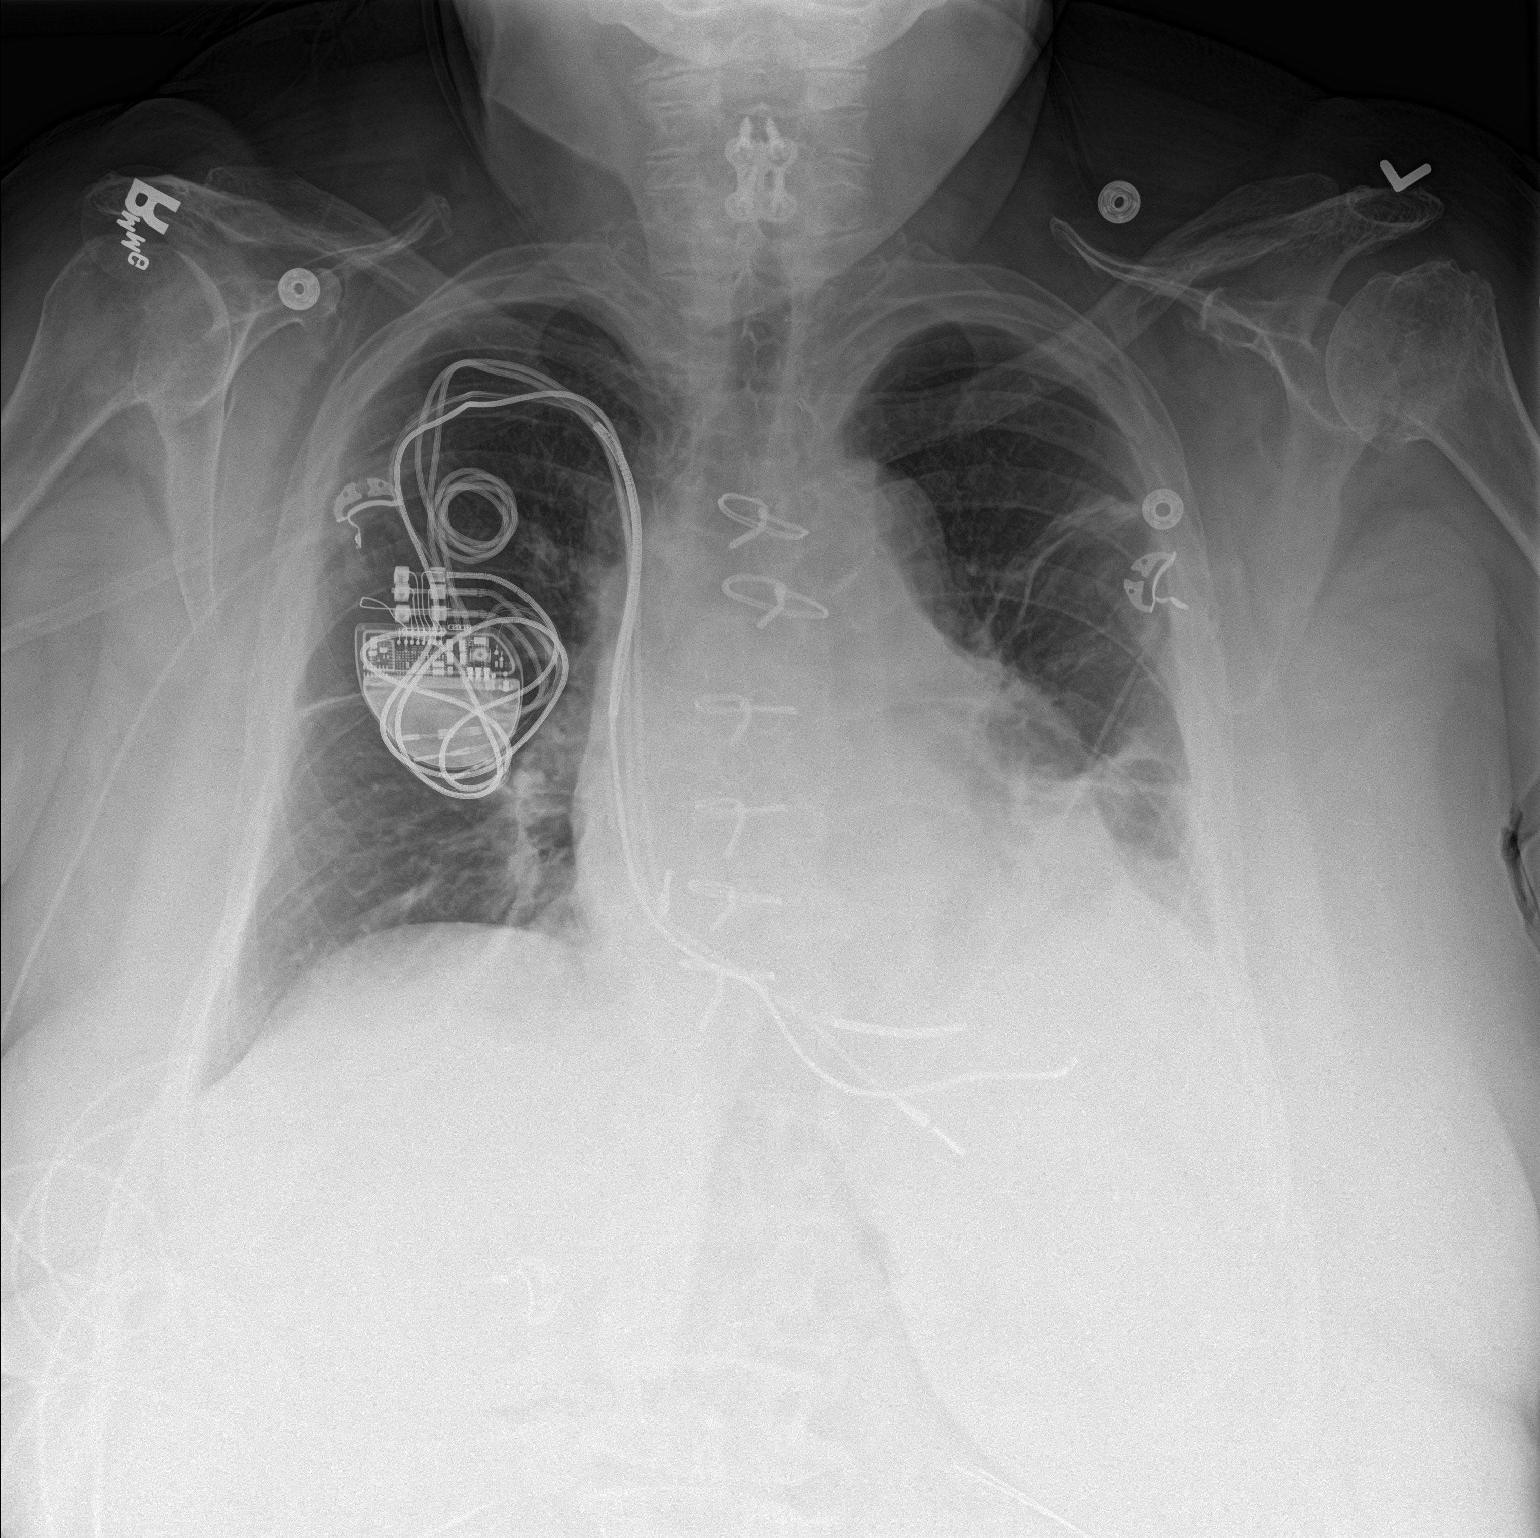

[chest lat]
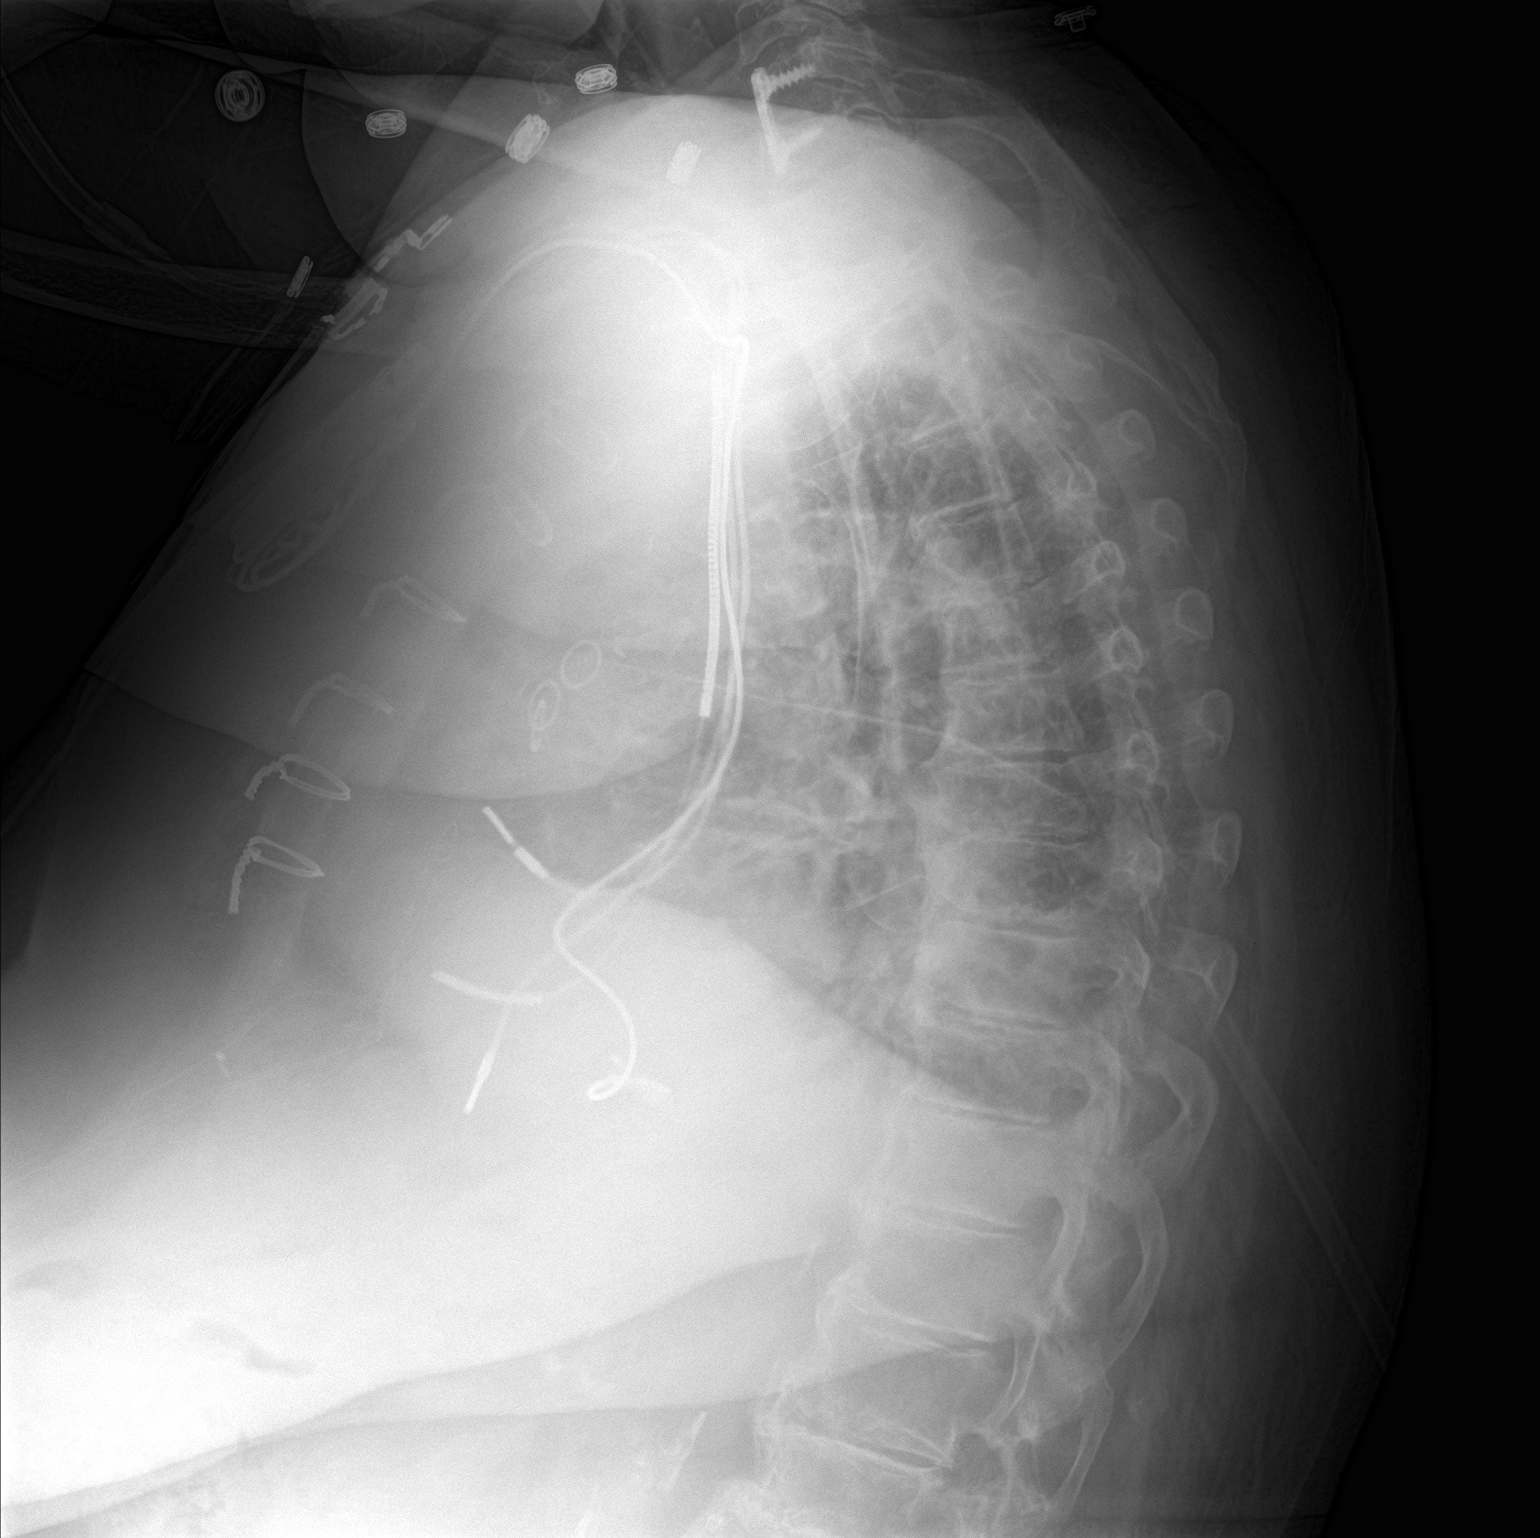

[2 of 2 positions shown; findings below may reference images not displayed]

FINDINGS: Cordis has been removed. No pneumothorax. Areas of patchy airspace
consolidation in the left upper and left lower lobe regions remains
stable. There is mild atelectatic change in the right base. Heart is
upper normal in size with pulmonary vascular within normal limits.
Pacemaker leads are unchanged in position. No adenopathy. There is
postoperative change in the lower cervical spine. Patient is status
post coronary artery bypass grafting.
IMPRESSION: No pneumothorax. Stable areas of patchy airspace consolidation on
the left. Mild right base atelectasis. No change in cardiac
silhouette.

## 2015-12-04 ENCOUNTER — Encounter: Payer: Self-pay | Admitting: *Deleted

## 2015-12-04 ENCOUNTER — Other Ambulatory Visit: Payer: Self-pay | Admitting: *Deleted

## 2015-12-04 DIAGNOSIS — I4891 Unspecified atrial fibrillation: Secondary | ICD-10-CM

## 2015-12-04 DIAGNOSIS — I509 Heart failure, unspecified: Secondary | ICD-10-CM

## 2015-12-04 NOTE — Patient Outreach (Addendum)
[place] [facility]) Care Management  12/04/2015  [name] 07/15/1945 [id number]   RN spoke with pt today and introduced the [facility] program and available services. Discussed pt's medical issues and possible needs that pt needs assistance.  All medical issues discussed in detail along with RN providing telephonic education on pt's medical issues as followed:  HTN:  Pt reports her blood pressure is controlled on her ongoing medications (no needs) DM:  Pt reports her diabetes is under control with her fasting CBG ranging from 110-120 (no needs) HF:  Pt reports she weighs daily with weights as followed today 241 lbs, yesterday and last week around 243 lbs with no signs or reported symptoms to report. Verified pt is in the GREEN zone on the HF zones along with educating pt on all three zones and what to do if acute symptoms should occur.  Pt in need of further education related to HF zones. ATRIAL FIB: Pt states this is a new diagnosis and she is trying to manage it with the medication that has been provided. Pt has a pacemaker but still has these symptoms on her last hospitalization. RN detailed the Atrial Fibrillation zones via [facility] calender explained the importance of daily monitoring to prevent acute events. Verified pt remains in the GREEN zone with no acute symptoms at this time and what to do if acute signs are presented.  MEDICATIONS: Pt states she has been adherent with all her medications however recently changes insurance carriers and difficulty obtain her Plavix. Pt states she has not taken this medication since last Friday (4 days). Denies any signs or symptoms but pt states she can not afford the current cost based upon what she has been told. RN offered [facility] pharmacy to get involved to obtain this medication as soon as possible based upon her current medical issues and recent hospitalization. Pt receptive to the assistance via [facility]. RN also contacted Lennette Bihari ([place]) regarding  pt's medication issues for further inquiry.   RN further discussed pt's medical issues and offered a home visit for more detail face-to-face education. Pt hesitant but willing to accept a health coach for ongoing telephonic education on HF. RN discussed and generated a plan of care along with set goals discussed today. Also completed a complete initial assessment to assist with her ongoing management of care. Based upon the above information  Based upon pt's medical issues (HTN/CAD/DM/HF/DM)offered to send a [facility] calendar for daily monitoring for her providers to view all readings and view how pt is managing her ongoing care.  Alicia Mina, RN Care Management Coordinator [place] Office [phone]

## 2015-12-05 ENCOUNTER — Other Ambulatory Visit: Payer: Self-pay | Admitting: Pharmacist

## 2015-12-05 ENCOUNTER — Other Ambulatory Visit: Payer: Self-pay

## 2015-12-05 MED ORDER — CLOPIDOGREL BISULFATE 75 MG PO TABS: 75.0000 mg | ORAL_TABLET | Freq: Every day | ORAL | 11 refills | Status: DC

## 2015-12-05 NOTE — Telephone Encounter (Signed)
Rx(s) sent to pharmacy electronically per [facility]. See [facility] telephone call from today, 12/05/2015.

## 2015-12-05 NOTE — Progress Notes (Signed)
Please refill clopidogrel for 12 months.

## 2015-12-05 NOTE — Patient Outreach (Signed)
[place] [facility]) Care Management  12/05/2015  [name] 30-Jun-1945 [id number]  Late entry for 12/04/15.    Received a phone call from [place], Vilinda Blanks, regarding referral for patient.  [facility] RN reports patient has been out of clopidogrel and patient stated it was a cost concern.    Placed call to [place] reports that clopidogrel was filled 10/2015 for #30 with no refills and a co-pay of $1.20.    Patient appears to be dual eligible with Medicare and Medicaid.    Successful phone outreach to patient, she verified HIPAA details.  Patient reports that she didn't know she was out of refills and states that if clopidogrel is $1.20 she would likely be able to afford it.    Patient saw Janan Ridge, PA with cardiology 10/31/15 and states her primary cardiologist is Dr Bertrum Sol.   Plan:  Will route this note to Dr Bertrum Sol to alert cardiology that patient is unable to fill clopidogrel due to needing clopidogrel refill if she will continue it.  Will follow-up with patient via phone.    Karrie Meres, PharmD, [place] [phone]

## 2015-12-06 ENCOUNTER — Other Ambulatory Visit: Payer: Self-pay | Admitting: Pharmacist

## 2015-12-06 NOTE — Patient Outreach (Signed)
[place] [facility]) Care Management  12/06/2015  [name] 29-May-1945 [id number]  Patient was initially referred to [place] by [place] M for evaluation of assistance with clopidogrel.   Per previous phone call to [place] where patient fills prescriptions, there was no refill on file at the local pharmacy and patient's cardiologist, Dr [name] was contacted regarding this by [place].   Dr Croitoru's office issued a refill for patient's clopidogrel to [facility].  Placed phone call to [facility] this morning and was told prescription is ready with a $1.20 co-pay.    Successful outreach call to patient who verified her HIPAA details.  Counseled patient Dr Croitoru's office sent a refill of clopidogrel to her pharmacy and that it was $1.20/30 day supply.   Patient states she can afford this co-pay.  Patient denies other pharmacy questions/concerns at this time.   Plan:  Will close pharmacy case.    Patient aware she can contact [place] if needed in the future.   Karrie Meres, PharmD, [place] [phone]

## 2015-12-07 ENCOUNTER — Telehealth: Payer: Self-pay | Admitting: Physician Assistant

## 2015-12-07 ENCOUNTER — Telehealth: Payer: Self-pay | Admitting: *Deleted

## 2015-12-07 MED ORDER — POTASSIUM CHLORIDE CRYS ER 20 MEQ PO TBCR: 20.0000 meq | EXTENDED_RELEASE_TABLET | Freq: Every day | ORAL | 3 refills | Status: DC

## 2015-12-07 MED ORDER — CLOPIDOGREL BISULFATE 75 MG PO TABS: 75.0000 mg | ORAL_TABLET | Freq: Every day | ORAL | 3 refills | Status: DC

## 2015-12-07 MED ORDER — TORSEMIDE 20 MG PO TABS: 40.0000 mg | ORAL_TABLET | Freq: Two times a day (BID) | ORAL | 3 refills | Status: DC

## 2015-12-07 MED ORDER — CARVEDILOL 6.25 MG PO TABS: 6.2500 mg | ORAL_TABLET | Freq: Two times a day (BID) | ORAL | 3 refills | Status: DC

## 2015-12-07 NOTE — Telephone Encounter (Signed)
Transmission shows no new arrhythmia events.  Two brief atrial tach episodes in May and July, longest ~5 sec, but no AFib.  Patient made aware and is appreciative.  She denies additional questions or concerns at this time.

## 2015-12-07 NOTE — Telephone Encounter (Signed)
Thank you, given no evidence of afib, we will NOT transition her to coumadin or NOAC, will keep on current medication instead unless there is definitive sign of afib.

## 2015-12-07 NOTE — Telephone Encounter (Signed)
Spoke with patient and walked her through manual transmission process.  Patient will send a transmission this morning, will review for any AF once received.  Patient denies additional questions or concerns at this time.

## 2015-12-07 NOTE — Telephone Encounter (Signed)
-----   Message from [place], [place] sent at 12/05/2015  7:46 PM EDT ----- Regarding: check with device clinic She is 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 She went to the hospital in Sep with stroke like symptom. I saw her on 10/31/2015 and recommended for her to send in a remote transmission this morning for [place] to review if she has any afib. I did not see anything in the EPIC, can you check with device clinic to see if they have received any transmission since I saw her.   Thanks  Signed, Almyra Deforest PA Pager: [phone]

## 2015-12-07 NOTE — Telephone Encounter (Signed)
Spoke to pt and informed her of results of the transmission and to continue current meds. She asked for refills of cardiac medications. Rx requests sent to [facility].

## 2015-12-07 NOTE — Telephone Encounter (Signed)
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 She went to the hospital in Sep with stroke like symptom. I saw her on 10/31/2015 and recommended for her to send in a remote transmission this morning for [place] to review if she has any afib. I did not see anything in the EPIC, can you check with device clinic to see if they have received any transmission since I saw her.   Thanks  Signed, Almyra Deforest PA Pager: [phone]

## 2015-12-07 NOTE — Telephone Encounter (Signed)
Contacted pt to make sure transmission was sent. She said she sent it in already, but we do not have it in the system yet. Contacted device clinic at [address], transmission was not done correctly so they will call pt and ask her to send in new transmission.

## 2015-12-07 NOTE — Addendum Note (Signed)
Addended by: [name] on: 12/07/2015 01:48 PM   Modules accepted: Orders

## 2015-12-10 ENCOUNTER — Other Ambulatory Visit: Payer: Self-pay

## 2015-12-10 NOTE — Patient Outreach (Signed)
[place] [facility]) Care Management  12/10/2015  NEMIAH BUBAR 07/10/1945 [id number]   Telephone call to patient for introductory call.  Patient reports that she is doing ok and that her weight was up some as she did not take her fluid pill yesterday but has taken it today and has been using the restroom frequently.  Discussed with patient weight perimeters and when to notify physician.  She verbalized understanding and is receptive to health coach role.    Plan: RN Health Coach will contact patient in the month of November and patient agrees to next outreach.  Jone Baseman, RN, MSN [place] [phone]

## 2015-12-12 IMAGING — XA IR FLUORO GUIDE CV LINE*R*
1 series · 2 of 2 positions shown · IV contrast (agent unspecified)
Comparison: none

INDICATION: In need of durable intravenous access for antibiotic administration.

EXAM:
ULTRASOUND AND FLUOROSCOPIC GUIDED PICC LINE INSERTION
MEDICATIONS:
None.
CONTRAST:  None
FLUOROSCOPY TIME:  12 seconds (18.2 mGy)
COMPLICATIONS:
None immediate
TECHNIQUE: The procedure, risks, benefits, and alternatives were explained to
the patient and informed written consent was obtained. A timeout was
performed prior to the initiation of the procedure.

[Series 1: run · 2 of 2 slices shown]
[im 1/2]
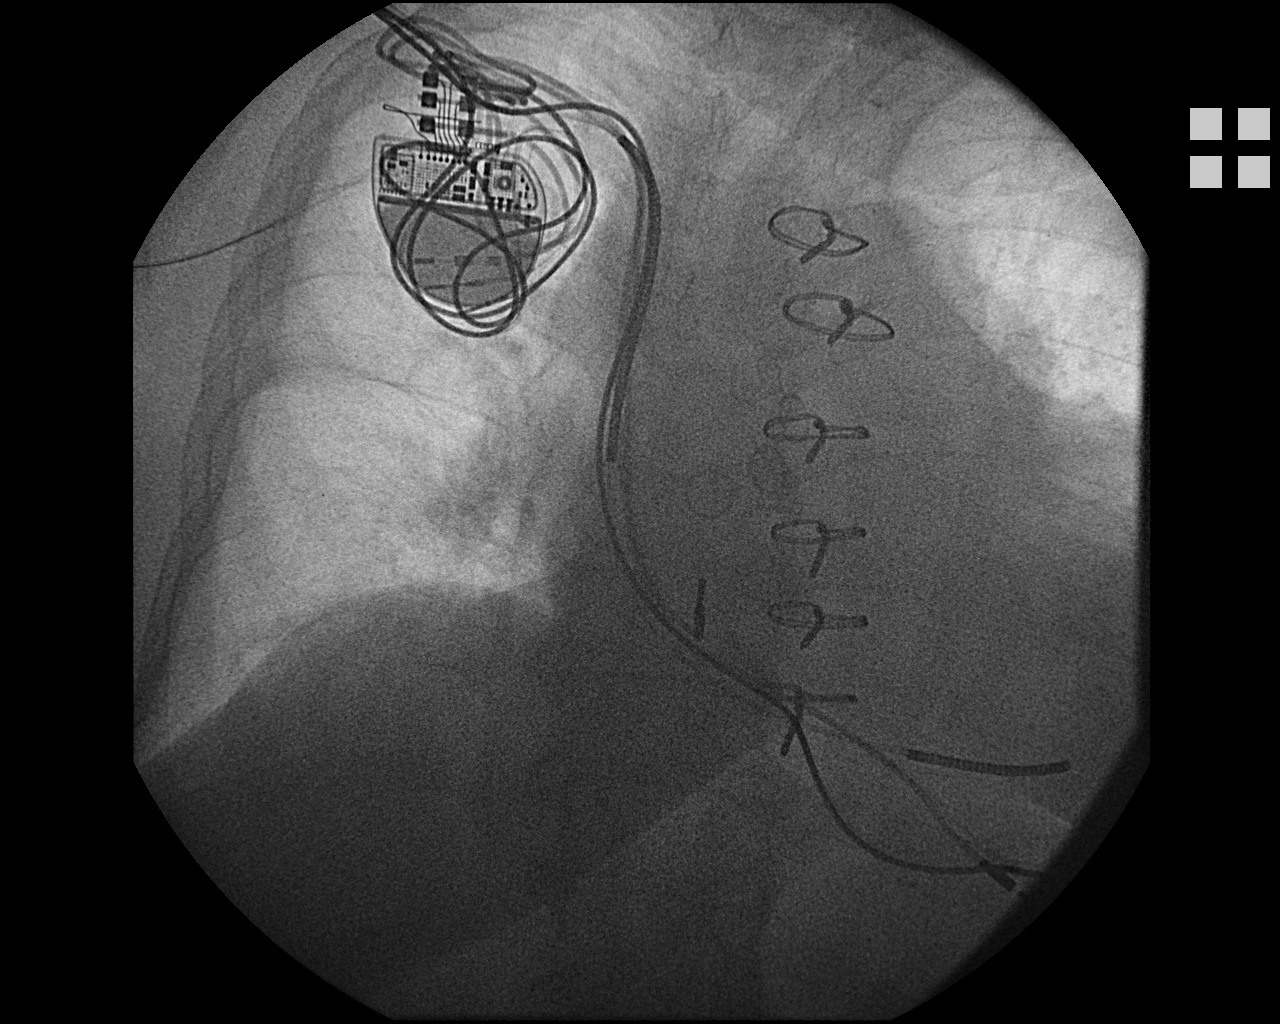
[im 2/2]
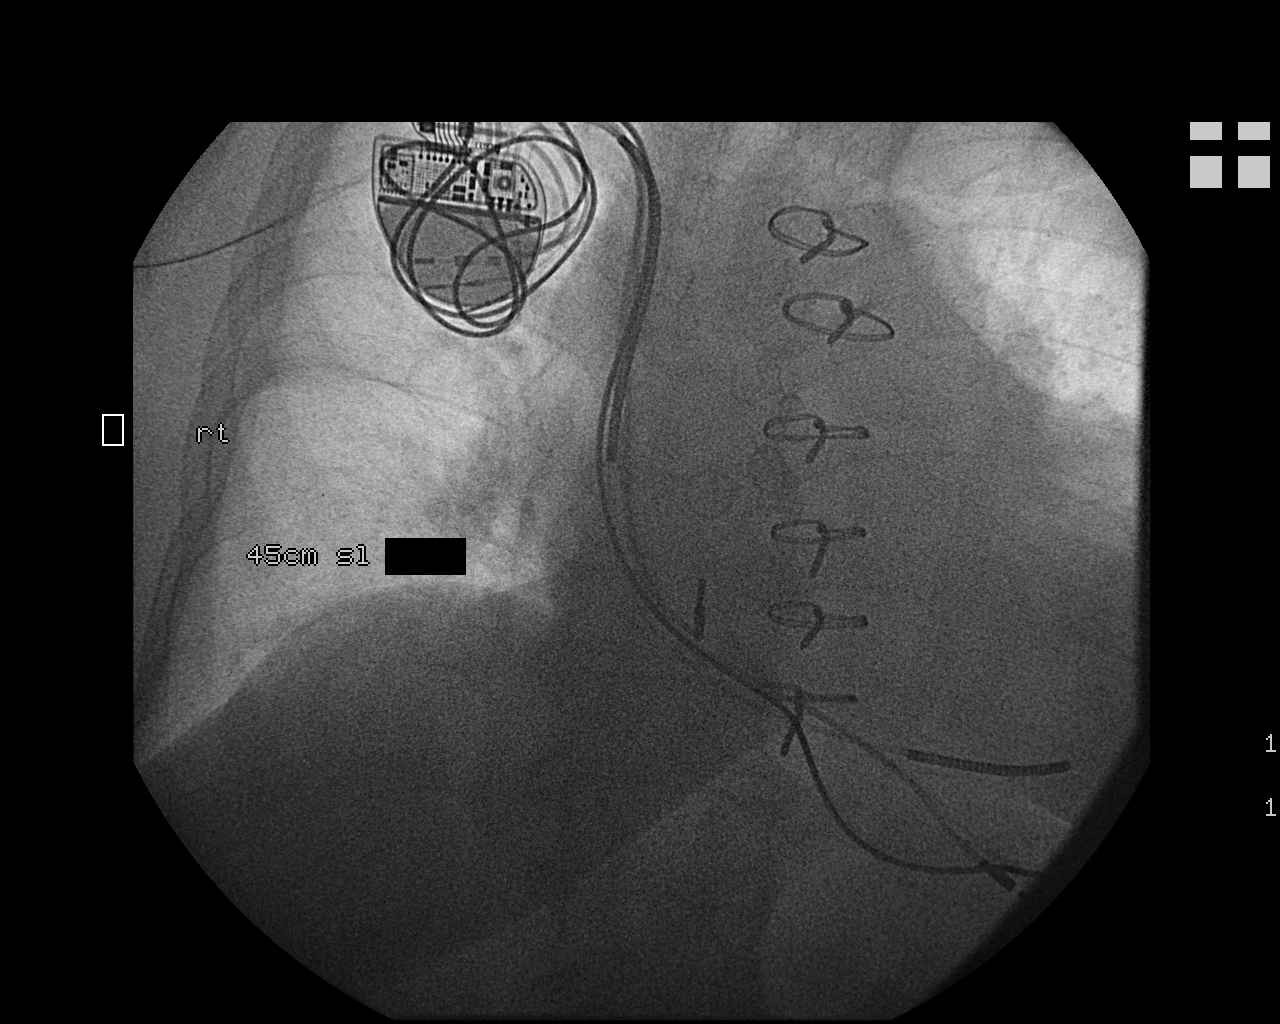

[2 of 2 positions shown; findings below may reference images not displayed]

The right upper extremity was prepped with chlorhexidine in a
sterile fashion, and a sterile drape was applied covering the
operative field. Maximum barrier sterile technique with sterile
gowns and gloves were used for the procedure. A timeout was
performed prior to the initiation of the procedure. Local anesthesia
was provided with 1% lidocaine.

Under direct ultrasound guidance, the right basilic vein was
accessed with a micropuncture kit after the overlying soft tissues
were anesthetized with 1% lidocaine. An ultrasound image was saved
for documentation purposes. A guidewire was advanced to the level of
the superior caval-atrial junction for measurement purposes and the
PICC line was cut to length. A peel-away sheath was placed and a 45
cm, 5 French, single lumen was inserted to level of the superior
caval-atrial junction. A post procedure spot fluoroscopic was
obtained. The catheter easily aspirated and flushed and was sutured
in place. A dressing was placed. The patient tolerated the procedure
well without immediate post procedural complication.
FINDINGS: After catheter placement, the tip lies within the superior
cavoatrial junction. The catheter aspirates and flushes normally and
is ready for immediate use.
IMPRESSION: Successful ultrasound and fluoroscopic guided placement of a right
basilic vein approach, 45 cm, 5 French, single lumen PICC with tip
at the superior caval-atrial junction. The PICC line is ready for
immediate use.

## 2015-12-13 ENCOUNTER — Encounter: Payer: Self-pay | Admitting: Nurse Practitioner

## 2015-12-13 ENCOUNTER — Ambulatory Visit (INDEPENDENT_AMBULATORY_CARE_PROVIDER_SITE_OTHER): Payer: Commercial Managed Care - HMO | Admitting: Nurse Practitioner

## 2015-12-13 VITALS — BP 113/72 | HR 79 | Ht 65.0 in | Wt 247.8 lb

## 2015-12-13 DIAGNOSIS — G4733 Obstructive sleep apnea (adult) (pediatric): Secondary | ICD-10-CM | POA: Diagnosis not present

## 2015-12-13 DIAGNOSIS — G459 Transient cerebral ischemic attack, unspecified: Secondary | ICD-10-CM

## 2015-12-13 DIAGNOSIS — I10 Essential (primary) hypertension: Secondary | ICD-10-CM

## 2015-12-13 NOTE — Progress Notes (Signed)
[facility]  PATIENT: [name] DOB: 11/23/1945   REASON FOR VISIT: Follow-up for right brain TIA versus stroke  HISTORY FROM: Patient    HISTORY OF PRESENT ILLNESS:Alicia K [name] an 70 y.o.femalehistory of hypertension, hyperlipidemia, obstructive sleep apnea, obesity, coronary artery disease and implanted cardiac defibrillator, who presented to the ED at [facility] following an episode of transient weakness and numbness involving left arm and left leg. She was last known well at 11 AM on 10/22/2015. She has no history of stroke nor TIA. She's been taking aspirin daily. Deficits resolved after about 30 minutes and have not recurred. CT scan of her head showed no acute intracranial abnormality. NIH stroke score at the time of this evaluation was 0.Ct Head Wo Contrast 10/22/2015  No acute intracranial findings. There is mild generalized atrophy and chronic appearing white matter hypodensities which likely represent small vessel ischemic disease. Remote left caudate lacunar infarction.  Ct Cervical Spine Wo Contrast 10/22/2015 No acute cervical spine findings. Prior C4-5 anterior interbody fusion. Moderate degenerative cervical disc disease from C4 through C7. CT repeat Stable non contrast CT appearance of the brain with no acute cortically based infarct identified. Evidence of some small vessel disease in the cerebral white matter.  CUS Unable to adequately visualize the right internal carotid artery, therefore cannot exclude hemodynamically significant stenosis. Findings suggest elevated left internal carotid artery stenosis suggestive of 40-59% stenosis. Vertebral arteries are patent with antegrade flow. TEE 55-60% EF.  Pacer interrogation 100% ventricular pacing LDL 167. Hemoglobin A1c 6.5. She was switched from aspirin to Plavix for secondary stroke prevention. She was placed on Lipitor 80 mg and Pravachol discontinued. She returns for follow-up in  stroke clinic today without further stroke or TIA symptoms. She remains on Plavix with minimal bruising and no bleeding. Due to myalgias her Lipitor was changed to Crestor 20 mg. She is due to follow up with her sleep physician regarding her obstructive sleep apnea. She has been noncompliant with wearing the mask. Blood pressure well controlled in the office today at 113/72. She gets no exercise, is morbidly obese. She returns for reevaluation   REVIEW OF SYSTEMS: Full 14 system review of systems performed and notable only for those listed, all others are neg:  Constitutional: Fatigue  Cardiovascular: neg Ear/Nose/Throat: neg  Skin: neg Eyes: neg Respiratory: Shortness of breath Gastroitestinal: neg  Hematology/Lymphatic: neg  Endocrine: Excessive thirst Musculoskeletal: Walking difficulty Allergy/Immunology: neg Neurological: Headache Psychiatric: neg Sleep : Restless legs obstructive sleep apnea   ALLERGIES: Allergies  Allergen Reactions  . Ivp Dye [Iodinated Diagnostic Agents] Shortness Of Breath    No reaction to PO contrast with non-ionic dye.06-25-2014/rsm  . Shellfish-Derived Products Anaphylaxis  . Sulfa Antibiotics Shortness Of Breath  . Iodine Hives  . Atorvastatin Other (See Comments)    Pt states "causes bilateral leg pain/cramps."  . Cephalexin Itching and Rash    Rash & itch after Keflex  . Zithromax [Azithromycin] Rash    HOME MEDICATIONS: Outpatient Medications Prior to Visit  Medication Sig Dispense Refill  . acetaminophen (TYLENOL) 500 MG tablet Take 1,000 mg by mouth every 6 (six) hours as needed (for pain.).    [name] albuterol (PROVENTIL HFA;VENTOLIN HFA) 108 (90 BASE) MCG/ACT inhaler Inhale 2 puffs into the lungs every 4 (four) hours as needed for wheezing (wheezing).     . carvedilol (COREG) 6.25 MG tablet Take 1 tablet (6.25 mg total) by mouth 2 (two) times daily with a meal. 180 tablet 3  .  cetirizine (ZYRTEC) 10 MG tablet Take 10 mg by mouth daily.     .  clopidogrel (PLAVIX) 75 MG tablet Take 1 tablet (75 mg total) by mouth daily. 90 tablet 3  . clotrimazole (MYCELEX) 10 MG troche Take 10 mg by mouth every 4 (four) hours as needed (mouth pain).    [name] EPINEPHrine 0.3 mg/0.3 mL IJ SOAJ injection     . fluorometholone (FML) 0.1 % ophthalmic suspension Place 1 drop into both eyes 2 (two) times daily.    . fluticasone (FLONASE) 50 MCG/ACT nasal spray Place 1 spray into the nose 2 (two) times daily as needed for rhinitis or allergies.     . isosorbide mononitrate (IMDUR) 30 MG 24 hr tablet Take 1 tablet (30 mg total) by mouth daily. 30 tablet 0  . omeprazole (PRILOSEC) 40 MG capsule Take 1 capsule (40 mg total) by mouth 2 (two) times daily before a meal. 60 capsule 11  . potassium chloride SA (KLOR-CON M20) 20 MEQ tablet Take 1 tablet (20 mEq total) by mouth daily. 90 tablet 3  . rosuvastatin (CRESTOR) 20 MG tablet Take 1 tablet (20 mg total) by mouth daily. 30 tablet 1  . spironolactone (ALDACTONE) 25 MG tablet Take 25 mg by mouth daily.    . SYMBICORT 160-4.5 MCG/ACT inhaler Inhale 2 puffs into the lungs 2 (two) times daily.    [name] torsemide (DEMADEX) 20 MG tablet Take 2 tablets (40 mg total) by mouth 2 (two) times daily. 180 tablet 3  . triamcinolone cream (KENALOG) 0.1 % Apply 1 application topically daily as needed (applied to affected itchy area(s)).     . spironolactone (ALDACTONE) 25 MG tablet TAKE ONE-HALF TABLET BY MOUTH TWICE DAILY 30 tablet 2   No facility-administered medications prior to visit.     PAST MEDICAL HISTORY: Past Medical History:  Diagnosis Date  . Anemia   . Arthritis   . Asthma   . CAD (coronary artery disease)   . Cellulitis and abscess of foot 12/2014   RT FOOT  . CHF (congestive heart failure) ([place])   . Diabetes mellitus without complication ([place])   . Diverticulosis   . Fatty liver disease, nonalcoholic   . Gastritis   . GERD (gastroesophageal reflux disease)   . H/O hiatal hernia   . Hyperplastic colon polyp     . Hypertension   . IBS (irritable bowel syndrome)   . ICD (implantable cardiac defibrillator) in place   . Internal hemorrhoids   . Obesity   . OSA (obstructive sleep apnea)   . Shortness of breath     PAST SURGICAL HISTORY: Past Surgical History:  Procedure Laterality Date  . ABDOMINAL ULTRASOUND  12/01/2011   Peripelvic cysts- #1- 2.4x1.9x2.3cm, #2-1.2x0.9x1.2cm  . ANKLE SURGERY     right  . APPENDECTOMY    . BIV PACEMAKER GENERATOR CHANGE OUT N/A 11/02/2012   Procedure: BIV PACEMAKER GENERATOR CHANGE OUT;  Surgeon: Sanda Klein, MD;  Location: [facility] CATH [facility];  Service: Cardiovascular;  Laterality: N/A;  . CARDIAC CATHETERIZATION  05/17/1999   No significant coronary obstructive disease w/ mild 20% luminal irregularity of the first diag branch of the LAD  . CARDIAC CATHETERIZATION  07/08/2002   No significant CAD, moderately depressed LV systolic function  . CARDIAC CATHETERIZATION Bilateral 04/26/2007   Normal findings, recommend medical therapy  . CARDIAC CATHETERIZATION  02/18/2008   Moderate CAD, would benefit from having a functional study, recommend continue medical therapy  . CARDIAC CATHETERIZATION  07/23/2012  Medical therapy  . CARDIAC CATHETERIZATION N/A 11/24/2014   Procedure: Left Heart Cath and Coronary Angiography;  Surgeon: Troy Sine, MD;  Location: [place] CV [facility];  Service: Cardiovascular;  Laterality: N/A;  . CARDIAC CATHETERIZATION  11/27/2014   Procedure: Intravascular Pressure Wire/FFR Study;  Surgeon: Peter M Martinique, MD;  Location: [place] [facility];  Service: Cardiovascular;;  . CARDIAC DEFIBRILLATOR PLACEMENT    . CERVICAL SPINE SURGERY     plate   . CORONARY ANGIOGRAM  2010  . CORONARY ARTERY BYPASS GRAFT N/A 11/29/2014   Procedure: CORONARY ARTERY BYPASS GRAFTING x 5 (LIMA-LAD, SVG-D, SVG-OM1-OM2, SVG-PD);  Surgeon: Melrose Nakayama, MD;  Location: [facility];  Service: Open Heart Surgery;  Laterality: N/A;  . KNEE SURGERY     right  . LEFT  HEART CATHETERIZATION WITH CORONARY ANGIOGRAM N/A 07/23/2012   Procedure: LEFT HEART CATHETERIZATION WITH CORONARY ANGIOGRAM;  Surgeon: Leonie Man, MD;  Location: [facility] CATH LAB;  Service: Cardiovascular;  Laterality: N/A;  Carlton Adam MYOVIEW  11/14/2011   Mild-moderate defect seen in Mid Inferolateral and Mid Anterolateral regions-consistant w/ infarct/scar. No significant ischemia demonstrated.  [name] PACEMAKER INSERTION  2001   BS BivICD  . TEE WITHOUT CARDIOVERSION N/A 11/29/2014   Procedure: TRANSESOPHAGEAL ECHOCARDIOGRAM (TEE);  Surgeon: Melrose Nakayama, MD;  Location: [facility];  Service: Open Heart Surgery;  Laterality: N/A;  . TRANSTHORACIC ECHOCARDIOGRAM  07/23/2012   EF 55-60%, normal-mild  . TUBAL LIGATION      FAMILY HISTORY: Family History  Problem Relation Age of Onset  . Breast cancer Mother   . Diabetes Mother   . Heart disease Maternal Grandmother   . Kidney disease Maternal Grandmother   . Diabetes Maternal Grandmother   . Glaucoma Maternal Aunt   . Heart disease Maternal Aunt   . Asthma Sister     SOCIAL HISTORY: Social History   Social History  . Marital status: Widowed    Spouse name: N/A  . Number of children: 5  . Years of education: N/A   Occupational History  . 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 Smoking status: Former Smoker    Packs/day: 0.25    Years: 3.00    Types: Cigarettes    Quit date: 02/11/1968  . Smokeless tobacco: Never Used  . Alcohol use No  . Drug use: No  . Sexual activity: No   Other Topics Concern  . Not on file   Social History Narrative   Widowed last year. She lives with her two sons.     PHYSICAL EXAM  Vitals:   12/13/15 1015  BP: 113/72  Pulse: 79  Weight: 247 lb 12.8 oz (112.4 kg)  Height: 5\' 5"  (1.651 m)   Body mass index is 41.24 kg/m.  Generalized: Well developed, Morbidly obese female in no acute distress  Head: normocephalic and atraumatic,. Oropharynx benign  Neck:  Supple, no carotid bruits  Cardiac: Regular rate rhythm, no murmur  Musculoskeletal: No deformity   Neurological examination   Mentation: Alert oriented to time, place, history taking. Attention span and concentration appropriate. Recent and remote memory intact.  Follows all commands speech and language fluent.   Cranial nerve II-XII: Fundoscopic exam not done. .Pupils were equal round reactive to light extraocular movements were full, visual field were full on confrontational test. Facial sensation and strength were normal. hearing was intact to finger rubbing bilaterally. Uvula tongue midline. head turning and shoulder shrug were normal and symmetric.Tongue  protrusion into cheek strength was normal. Motor: normal bulk and tone, full strength in the BUE, BLE, fine finger movements normal, no pronator drift. No focal weakness Sensory: normal and symmetric to light touch, pinprick, and  Vibration, in the upper and lower extremities Coordination: finger-nose-finger, heel-to-shin bilaterally, no dysmetria Reflexes: 1+ upper lower and symmetric, plantar responses were flexor bilaterally. Gait and Station: Rising up from seated position without assistance, normal stance,  moderate stride, good arm swing, smooth turning, able to perform tiptoe, and heel walking without difficulty. Tandem gait is unsteady.   DIAGNOSTIC DATA (LABS, IMAGING, TESTING) - I reviewed patient records, labs, notes, testing and imaging myself where available.  Lab Results  Component Value Date   WBC 7.6 10/22/2015   HGB 13.2 10/22/2015   HCT 43.7 10/22/2015   MCV 88.6 10/22/2015   PLT 156 10/22/2015      Component Value Date/Time   NA 140 10/22/2015 2134   K 4.3 10/22/2015 2134   CL 104 10/22/2015 2134   CO2 28 10/22/2015 2134   GLUCOSE 117 (H) 10/22/2015 2134   BUN 27 (H) 10/22/2015 2134   CREATININE 1.18 (H) 10/22/2015 2134   CREATININE 1.05 04/04/2014 1014   CALCIUM 9.2 10/22/2015 2134   PROT 7.1 10/22/2015  2134   ALBUMIN 3.5 10/22/2015 2134   AST 17 10/22/2015 2134   ALT 22 10/22/2015 2134   ALKPHOS 97 10/22/2015 2134   BILITOT 0.4 10/22/2015 2134   GFRNONAA 45 (L) 10/22/2015 2134   GFRAA 52 (L) 10/22/2015 2134   Lab Results  Component Value Date   CHOL 241 (H) 10/23/2015   HDL 40 (L) 10/23/2015   LDLCALC 167 (H) 10/23/2015   TRIG 172 (H) 10/23/2015   CHOLHDL 6.0 10/23/2015   Lab Results  Component Value Date   HGBA1C 6.5 (H) 10/23/2015   Lab Results  Component Value Date   VITAMINB12 208 (L) 07/12/2015   Lab Results  Component Value Date   TSH 3.677 02/21/2015      ASSESSMENT AND PLAN  70 y.o. year old female  has a past medical history of  CAD (coronary artery disease); ; CHF (congestive heart failure) ([place]);  Hypertension;  ICD (implantable cardiac defibrillator) in place;  Obesity; OSA (obstructive sleep apnea); and Shortness of breath. Recent right brain TIA versus subcortical infarct here for hospital follow-up.    PLAN: Stressed the importance of management of risk factors to prevent further stroke Continue Plavix for secondary stroke prevention Maintain strict control of hypertension with blood pressure goal below 130/90, today's reading 113/72 continue antihypertensive medications hemoglobin A1c below 6.5  most recent hemoglobin A1c6.5 Cholesterol with LDL cholesterol less than 70, followed by cardiology,  most recent 167 continue Crestor Exercise by walking, slowly increase , eat healthy diet with whole grains,  fresh fruits and vegetables. Follow-up with a sleep doctor regarding obstructive sleep apnea which puts you at increased risk for stroke if untreated Discussed risk for recurrent stroke/ TIA and answered additional questions Follow-up in 3 months This was a visit requiring 30 minutes and medical decision making of high complexity with extensive review of history, hospital chart, counseling and answering questions Dennie Bible, [facility], [facility],  APRN  [facility] [address] [place], [place] [address] [phone]

## 2015-12-13 NOTE — Patient Instructions (Addendum)
Stressed the importance of management of risk factors to prevent further stroke Continue Plavix for secondary stroke prevention Maintain strict control of hypertension with blood pressure goal below 130/90, today's reading 113/72 continue antihypertensive medications hemoglobin A1c below 6.5  most recent hemoglobin A1c6.5 Cholesterol with LDL cholesterol less than 70, followed by primary care,  most recent 167 continue Crestor Exercise by walking, slowly increase , eat healthy diet with whole grains,  fresh fruits and vegetables. Follow-up with a sleep doctor regarding obstructive sleep apnea which puts you at increased risk for stroke is untreated Discussed risk for recurrent stroke/ TIA and answered additional questions Follow-up in 3 months

## 2015-12-13 NOTE — Progress Notes (Signed)
I reviewed above note and agree with the assessment and plan.  However, she did not get thorough stroke work up for brain vessel evaluation during admission as per my opinion. She had carotid doppler but due to technical difficulty, right ICA not able to detect. Do not know if it is occluded. TCD also no temporal window to evaluate anterior circulation well. Therefore, I think, before or at next appointment, she should have CTA head and neck done to finish off stroke work up. Thank you.   Rosalin Hawking, MD PhD Stroke Neurology 12/13/2015 5:50 PM

## 2015-12-17 IMAGING — CR DG CHEST 2V
2 series · 2 of 2 positions shown · non-contrast
Comparison: 12/05/2014 and earlier.

CLINICAL DATA: 69-year-old female with shortness of breath and
lower extremity pain. Right lower extremity cellulitis. Recent CABG
in [REDACTED]. Initial encounter.

EXAM:
CHEST  2 VIEW

[chest pa]
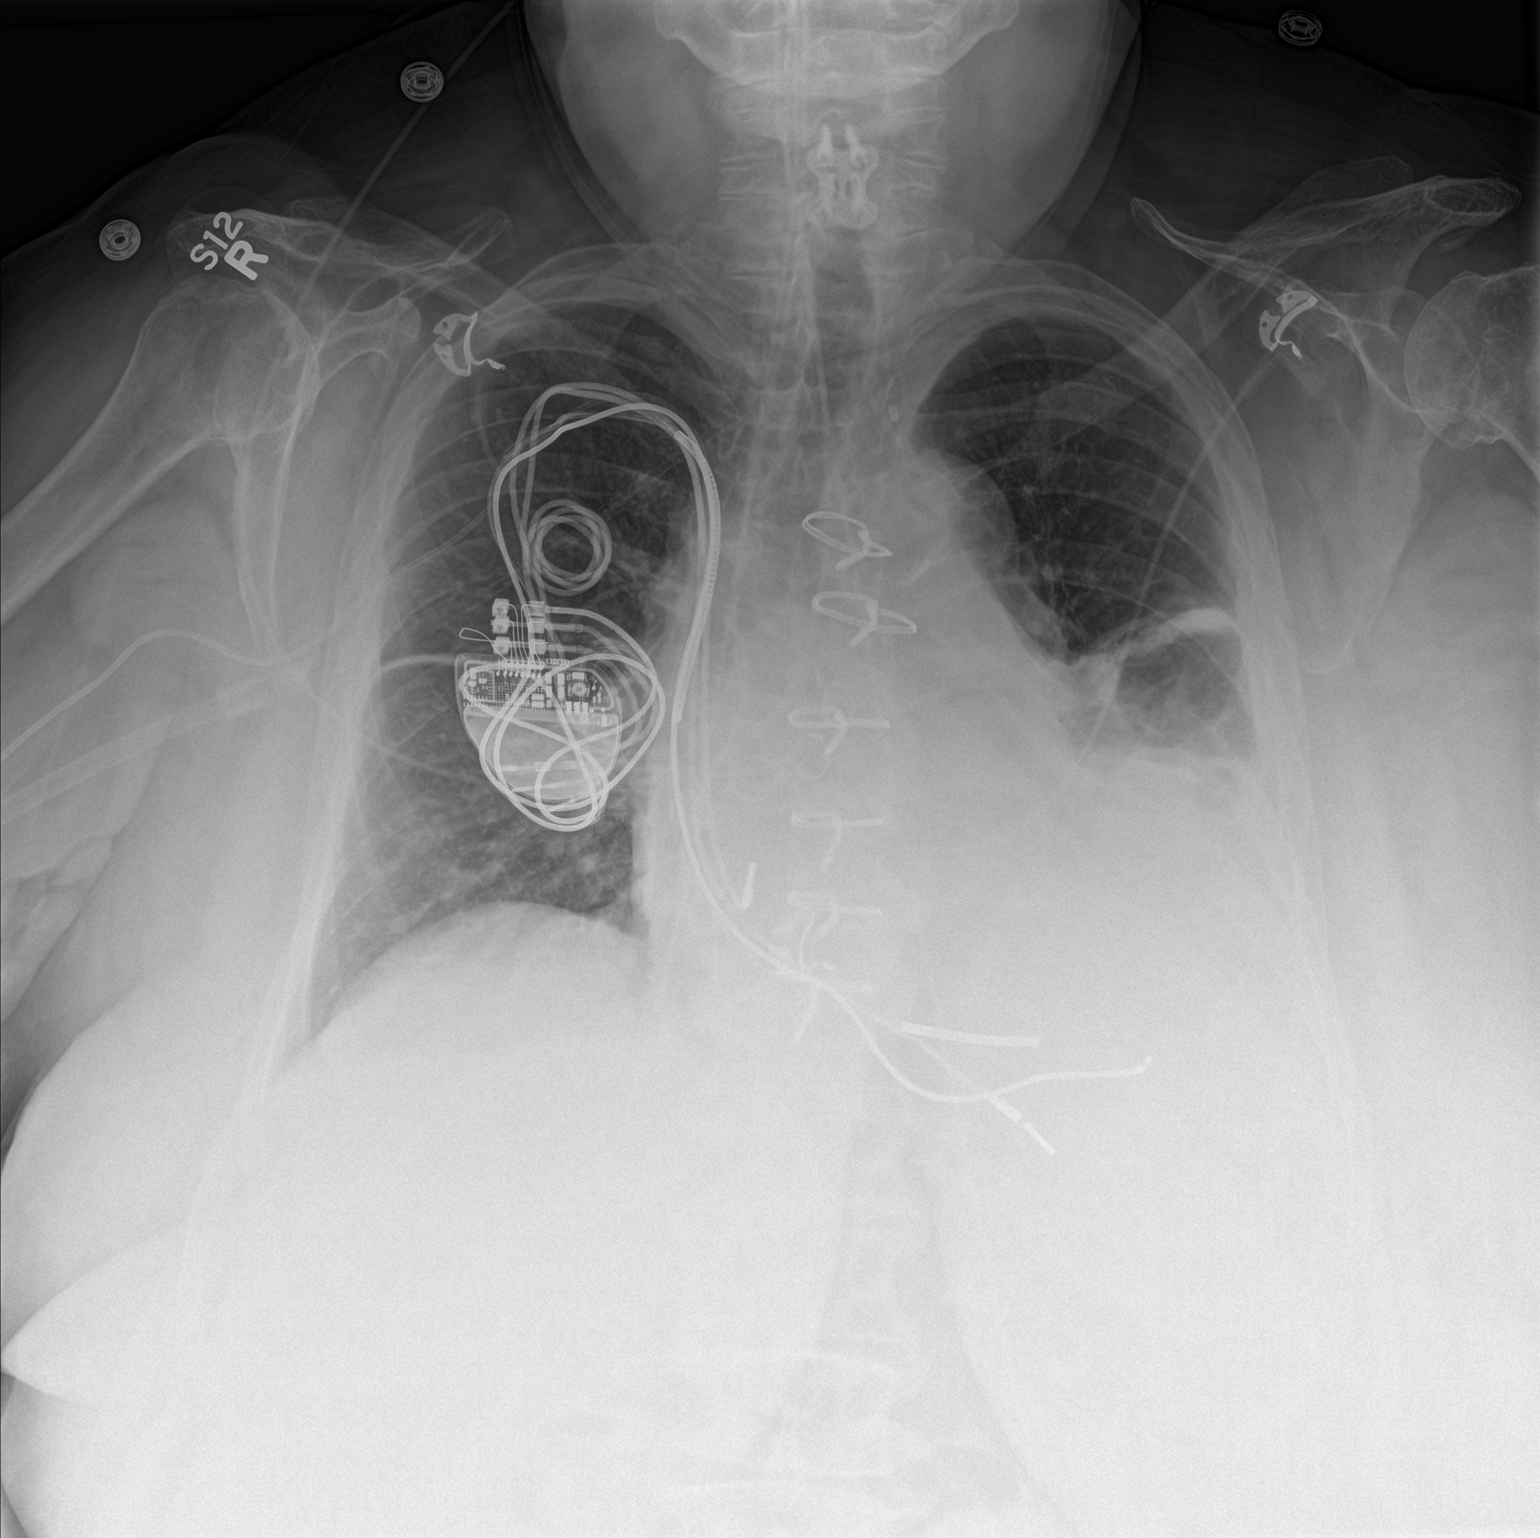

[chest lat]
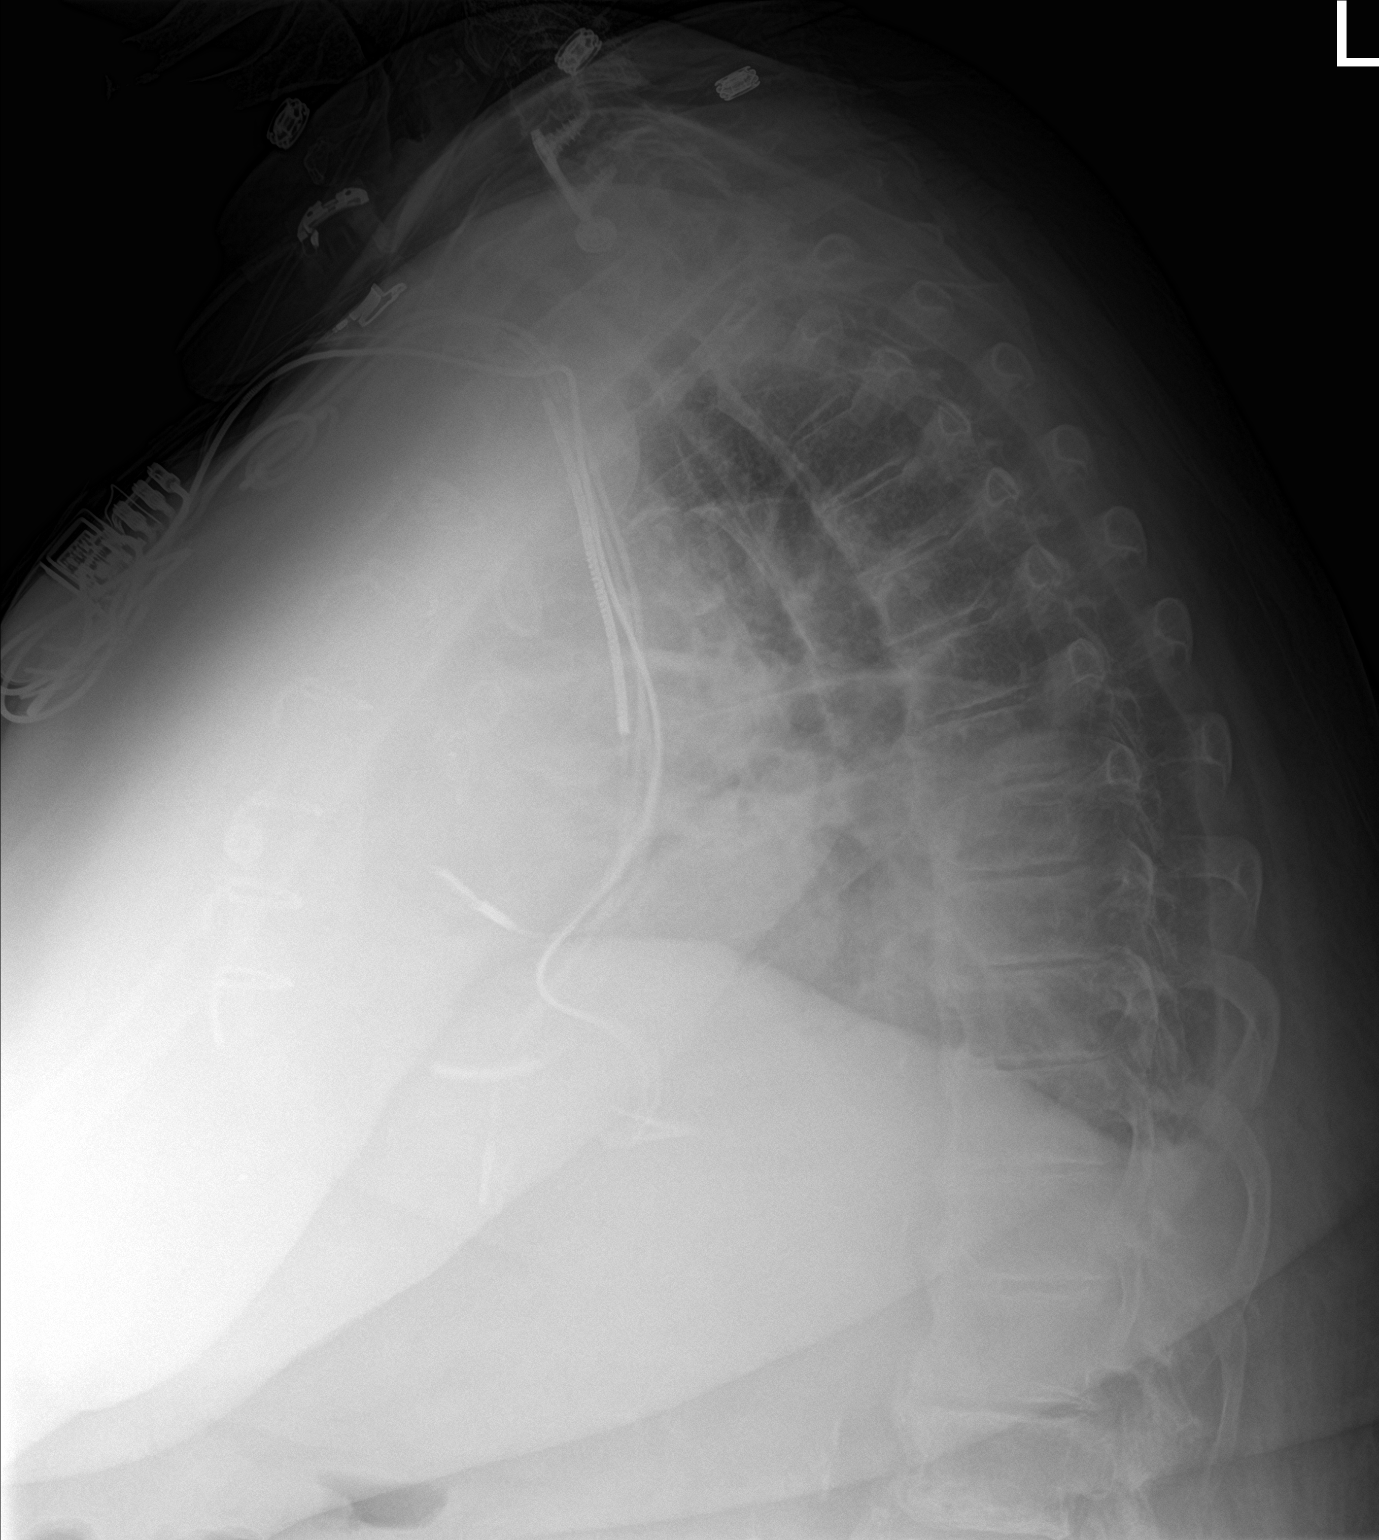

[2 of 2 positions shown; findings below may reference images not displayed]

FINDINGS: Large body habitus. The patient's arms are not raised on the lateral
view limiting its utility. Stable cardiac size and mediastinal
contours. Sequelae of CABG. Right chest cardiac AICD appears stable.
Lung volumes are stable, but there has been some worsening of
ventilation at the left lung base felt related to combination small
left pleural effusion and atelectasis. No pneumothorax or pulmonary
edema. The right lung remains clear. Stable visualized osseous
structures. Cervical ACDF. Calcified aortic atherosclerosis.
IMPRESSION: Stable lung volumes as seen in [REDACTED]. Worsening ventilation at the
left lung base felt related to combined small pleural effusion and
atelectasis. Left lung base pneumonia would be difficult to exclude.

## 2015-12-18 ENCOUNTER — Encounter (INDEPENDENT_AMBULATORY_CARE_PROVIDER_SITE_OTHER): Payer: Self-pay | Admitting: Orthopedic Surgery

## 2015-12-18 ENCOUNTER — Ambulatory Visit (INDEPENDENT_AMBULATORY_CARE_PROVIDER_SITE_OTHER): Payer: Commercial Managed Care - HMO | Admitting: Orthopedic Surgery

## 2015-12-18 ENCOUNTER — Ambulatory Visit (INDEPENDENT_AMBULATORY_CARE_PROVIDER_SITE_OTHER): Payer: Commercial Managed Care - HMO

## 2015-12-18 VITALS — Ht 65.0 in | Wt 247.0 lb

## 2015-12-18 DIAGNOSIS — M6702 Short Achilles tendon (acquired), left ankle: Secondary | ICD-10-CM

## 2015-12-18 DIAGNOSIS — S92512A Displaced fracture of proximal phalanx of left lesser toe(s), initial encounter for closed fracture: Secondary | ICD-10-CM

## 2015-12-18 DIAGNOSIS — M79672 Pain in left foot: Secondary | ICD-10-CM

## 2015-12-18 NOTE — Progress Notes (Signed)
Office Visit Note   Patient: [name]           Date of Birth: March 30, 1945           MRN: [id number] Visit Date: 12/18/2015              Requested by: Darcus Austin, MD [address] [place], [place] [address] PCP: [name], MD   Assessment & Plan: Visit Diagnoses:  1. Pain in left foot   2. Closed displaced fracture of proximal phalanx of lesser toe of left foot, initial encounter   3. Short Achilles tendon (acquired), left ankle     Plan: Place her in a postoperative shoe for the left foot fifth toe fracture. Patient was also given instructions for heel cord stretching for her tight Achilles tendon and this was demonstrated to her. Patient was also given a prescription for Achilles stretching she states she will be starting physical therapy for exercising at a gym and will have a trainer and we will have the trainer also work on Achilles stretching. I feel all of her forefoot pain is secondary to the tight Achilles.  Repeat two-view radiographs of the left foot at follow-up.  Follow-Up Instructions: Return in about 3 weeks (around 01/08/2016).   Orders:  Orders Placed This Encounter  Procedures  . XR Foot 2 Views Left   No orders of the defined types were placed in this encounter.     Procedures: No procedures performed   Clinical Data: No additional findings.   Subjective: Chief Complaint  Patient presents with  . Left Foot - Pain    Left great toe referral from Dr. Darcus Austin    Pt referral for left foot pain. Pt states " I broke that foot in march" and that she was treated with a cast by Dr. Lorin Mercy. Per the pt's notes she has a fibula fracture and she had worn multiple casts the last one in June. Pt states that the foot doesn't " feel right" and that she is having a " funny feeling" in the great toe. She is not able to articulate what exactly that feeling is. Patient does say that she has pain with ambulation and that this causes  her to limp. Patient believes that this is due to her previous injury and that she never " got better"     Review of Systems   Objective: Vital Signs: Ht 5\' 5"  (1.651 m)   Wt 247 lb (112 kg)   BMI 41.10 kg/m   Physical Exam Patient is alert oriented no adenopathy well-dressed normal affect normal respiratory effort.  Patient does have an antalgic gait. Examination of the left lower extremity patient has significant heel cord contracture with dorsiflexion about 20 short of neutral with her knee extended. She has good pulses she does have swelling with venous stasis changes she has ecchymosis and bruising around the little toe no open wounds. The toe was straight. Radiographs do show a displaced fracture of the proximal phalanx of the little toe.  Ortho Exam  Specialty Comments:  No specialty comments available.  Imaging: Xr Foot 2 Views Left  Result Date: 12/18/2015 Two-view radiographs obtained of the left foot shows a displaced fracture proximal phalanx left little toe.    PMFS History: Patient Active Problem List   Diagnosis Date Noted  . Atrial fibrillation ([place]) 10/24/2015  . Chest pain 10/23/2015  . TIA (transient ischemic attack) 10/22/2015  . Complete heart block ([place]) 06/22/2015  .  Allergic drug rash due to anti-infective agent 04/29/2015  . Fatigue 02/14/2015  . Chronic diastolic heart failure ([place]) 02/14/2015  . Cellulitis 12/21/2014  . S/P CABG x 5 11/29/2014  . CAD, multiple vessel   . Acute diastolic HF (heart failure) ([place]) 11/23/2014  . Diarrhea 07/12/2014  . Generalized abdominal pain 07/12/2014  . Diastolic CHF, acute on chronic (HCC) 11/04/2013  . Mixed hypercholesterolemia and hypertriglyceridemia 04/22/2013  . Vertigo 04/22/2013  . Dyspnea 08/25/2012  . Allergic to IV contrast 07/23/2012  . Unstable angina ([place]) 07/22/2012  . Morbid obesity due to excess calories ([place]) 11/22/2011  . Cardiomyopathy, nonischemic, Bi V responder,  EF 65-70% echo  Oct 2015 11/22/2011  . GERD (gastroesophageal reflux disease) 11/22/2011  . Back pain 11/22/2011  . OSA (obstructive sleep apnea) 11/22/2011  . HEMORRHOIDS-EXTERNAL 02/21/2010  . NAUSEA 02/21/2010  . ABDOMINAL PAIN -GENERALIZED 02/21/2010  . PERSONAL HX COLONIC POLYPS 02/21/2010  . ANEMIA 04/16/2007  . Essential hypertension 04/16/2007  . Coronary atherosclerosis 04/16/2007  . DIVERTICULOSIS, COLON 04/16/2007  . ARTHRITIS 04/16/2007  . INTERNAL HEMORRHOIDS WITHOUT MENTION COMP 04/12/2007  . Asthma 04/12/2007  . Cardiac resynchronization therapy pacemaker (CRT-P) in place 04/12/2007  . ACUTE GASTRITIS WITHOUT MENTION OF HEMORRHAGE 12/14/2006  . COLONIC POLYPS, HYPERPLASTIC 09/27/2002  . FATTY LIVER DISEASE 02/16/2002   Past Medical History:  Diagnosis Date  . Anemia   . Arthritis   . Asthma   . CAD (coronary artery disease)   . Cellulitis and abscess of foot 12/2014   RT FOOT  . CHF (congestive heart failure) ([place])   . Diabetes mellitus without complication ([place])   . Diverticulosis   . Fatty liver disease, nonalcoholic   . Gastritis   . GERD (gastroesophageal reflux disease)   . H/O hiatal hernia   . Hyperplastic colon polyp   . Hypertension   . IBS (irritable bowel syndrome)   . ICD (implantable cardiac defibrillator) in place   . Internal hemorrhoids   . Obesity   . OSA (obstructive sleep apnea)   . Shortness of breath     Family History  Problem Relation Age of Onset  . Breast cancer Mother   . Diabetes Mother   . Heart disease Maternal Grandmother   . Kidney disease Maternal Grandmother   . Diabetes Maternal Grandmother   . Glaucoma Maternal Aunt   . Heart disease Maternal Aunt   . Asthma Sister     Past Surgical History:  Procedure Laterality Date  . ABDOMINAL ULTRASOUND  12/01/2011   Peripelvic cysts- #1- 2.4x1.9x2.3cm, #2-1.2x0.9x1.2cm  . ANKLE SURGERY     right  . APPENDECTOMY    . BIV PACEMAKER GENERATOR CHANGE OUT N/A 11/02/2012   Procedure: BIV  PACEMAKER GENERATOR CHANGE OUT;  Surgeon: Sanda Klein, MD;  Location: [facility] CATH LAB;  Service: Cardiovascular;  Laterality: N/A;  . CARDIAC CATHETERIZATION  05/17/1999   No significant coronary obstructive disease w/ mild 20% luminal irregularity of the first diag branch of the LAD  . CARDIAC CATHETERIZATION  07/08/2002   No significant CAD, moderately depressed LV systolic function  . CARDIAC CATHETERIZATION Bilateral 04/26/2007   Normal findings, recommend medical therapy  . CARDIAC CATHETERIZATION  02/18/2008   Moderate CAD, would benefit from having a functional study, recommend continue medical therapy  . CARDIAC CATHETERIZATION  07/23/2012   Medical therapy  . CARDIAC CATHETERIZATION N/A 11/24/2014   Procedure: Left Heart Cath and Coronary Angiography;  Surgeon: Troy Sine, MD;  Location: [facility] CV LAB;  Service: Cardiovascular;  Laterality: N/A;  . CARDIAC CATHETERIZATION  11/27/2014   Procedure: Intravascular Pressure Wire/FFR Study;  Surgeon: Peter M Martinique, MD;  Location: [facility] CV LAB;  Service: Cardiovascular;;  . CARDIAC DEFIBRILLATOR PLACEMENT    . CERVICAL SPINE SURGERY     plate   . CORONARY ANGIOGRAM  2010  . CORONARY ARTERY BYPASS GRAFT N/A 11/29/2014   Procedure: CORONARY ARTERY BYPASS GRAFTING x 5 (LIMA-LAD, SVG-D, SVG-OM1-OM2, SVG-PD);  Surgeon: Melrose Nakayama, MD;  Location: [facility];  Service: Open Heart Surgery;  Laterality: N/A;  . KNEE SURGERY     right  . LEFT HEART CATHETERIZATION WITH CORONARY ANGIOGRAM N/A 07/23/2012   Procedure: LEFT HEART CATHETERIZATION WITH CORONARY ANGIOGRAM;  Surgeon: Leonie Man, MD;  Location[facility] C[facility] LAB;  Service: Cardiovascular;  Laterality: N/A;  Carlton Adam MYOVIEW  11/14/2011   Mild-moderate defect seen in Mid Inferolateral and Mid Anterolateral regions-consistant w/ infarct/scar. No significant ischemia demonstrated.  [name] PACEMAKER INSERTION  2001   BS BivICD  . TEE WITHOUT CARDIOVERSION N/A 11/29/2014   Procedure:  TRANSESOPHAGEAL ECHOCARDIOGRAM (TEE);  Surgeon: Melrose Nakayama, MD;  Location: [facility];  Service: Open Heart Surgery;  Laterality: N/A;  . TRANSTHORACIC ECHOCARDIOGRAM  07/23/2012   EF 55-60%, normal-mild  . TUBAL LIGATION     Social History   Occupational History  . 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 Smoking status: Former Smoker    Packs/day: 0.25    Years: 3.00    Types: Cigarettes    Quit date: 02/11/1968  . Smokeless tobacco: Never Used  . Alcohol use No  . Drug use: No  . Sexual activity: No

## 2015-12-24 ENCOUNTER — Institutional Professional Consult (permissible substitution): Payer: Commercial Managed Care - HMO | Admitting: Pulmonary Disease

## 2015-12-27 ENCOUNTER — Other Ambulatory Visit: Payer: Self-pay | Admitting: *Deleted

## 2015-12-27 NOTE — Telephone Encounter (Signed)
Patient also requested a refill on pravastatin 80 mg be sent to [facility].

## 2015-12-28 MED ORDER — SPIRONOLACTONE 25 MG PO TABS: 25.0000 mg | ORAL_TABLET | Freq: Every day | ORAL | 3 refills | Status: DC

## 2015-12-28 MED ORDER — ROSUVASTATIN CALCIUM 20 MG PO TABS: 20.0000 mg | ORAL_TABLET | Freq: Every day | ORAL | 3 refills | Status: DC

## 2015-12-28 NOTE — Telephone Encounter (Signed)
Rx(s) sent to pharmacy electronically.  Pravastatin was discontinued and ultimately Crestor 20 mg was added to her therapy.  Called patient to make sure she was taking Crestor, which she was in fact taking the Crestor.  Refills of Crestor sent to [facility] per patient request.

## 2016-01-01 ENCOUNTER — Other Ambulatory Visit: Payer: Self-pay

## 2016-01-01 NOTE — Patient Outreach (Signed)
[facility]) Care Management  01/01/2016  EVANELL REDLICH [birth year] [id number]   Telephone call to patient for initial assessment.  Female answers stating patient not in.  Health Coach contact information given.   Plan: RN Health Coach will attempt patient in the month of December.  Jone Baseman, RN, MSN [place] [phone]

## 2016-01-05 IMAGING — CR DG CHEST 2V
2 series · 2 of 2 positions shown · non-contrast
Comparison: Chest x-ray of 12/21/2014

CLINICAL DATA: History of CABG in Tuesday November, 2014, some shortness
of breath, former smoking history

EXAM:
CHEST  2 VIEW

[w chest pa]
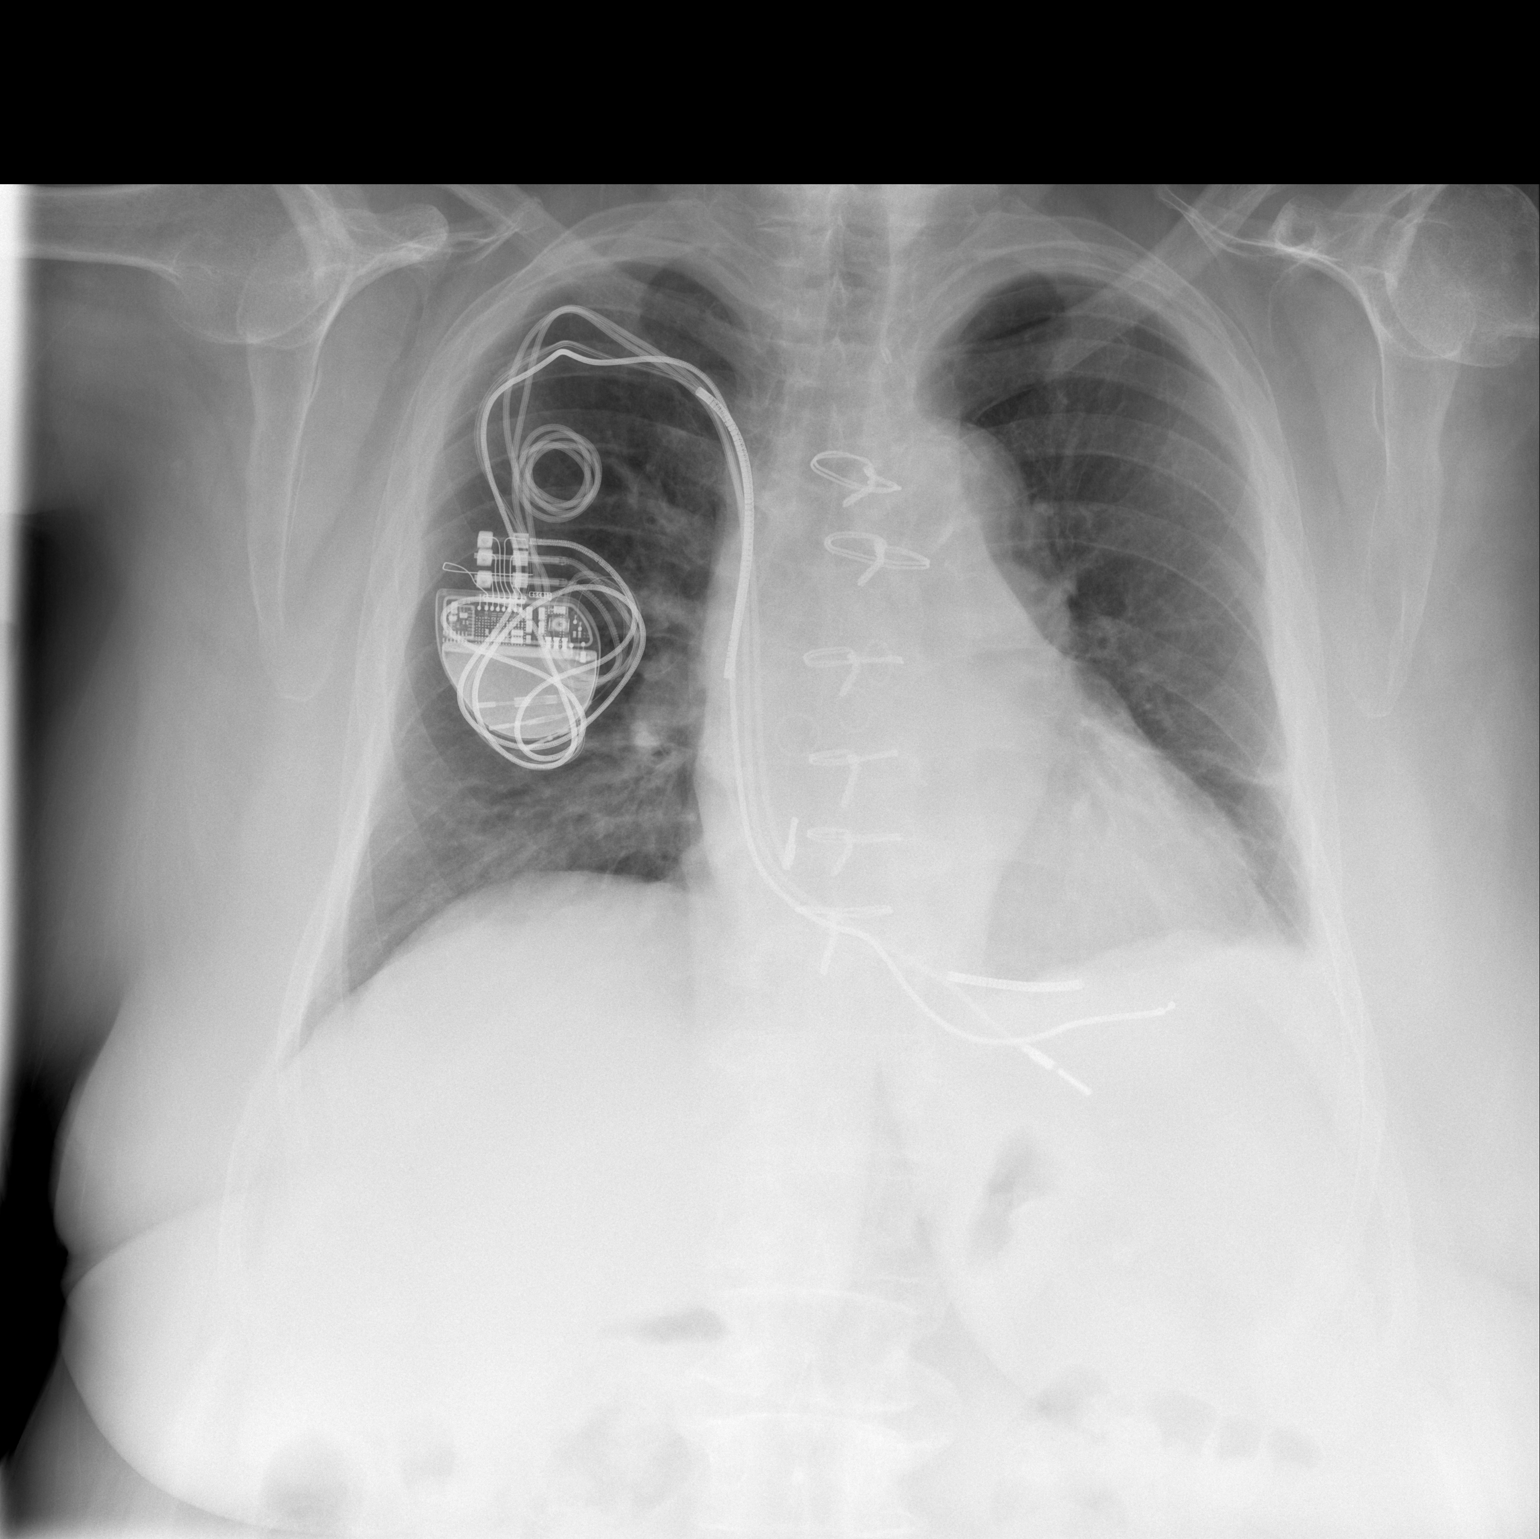

[w chest lat]
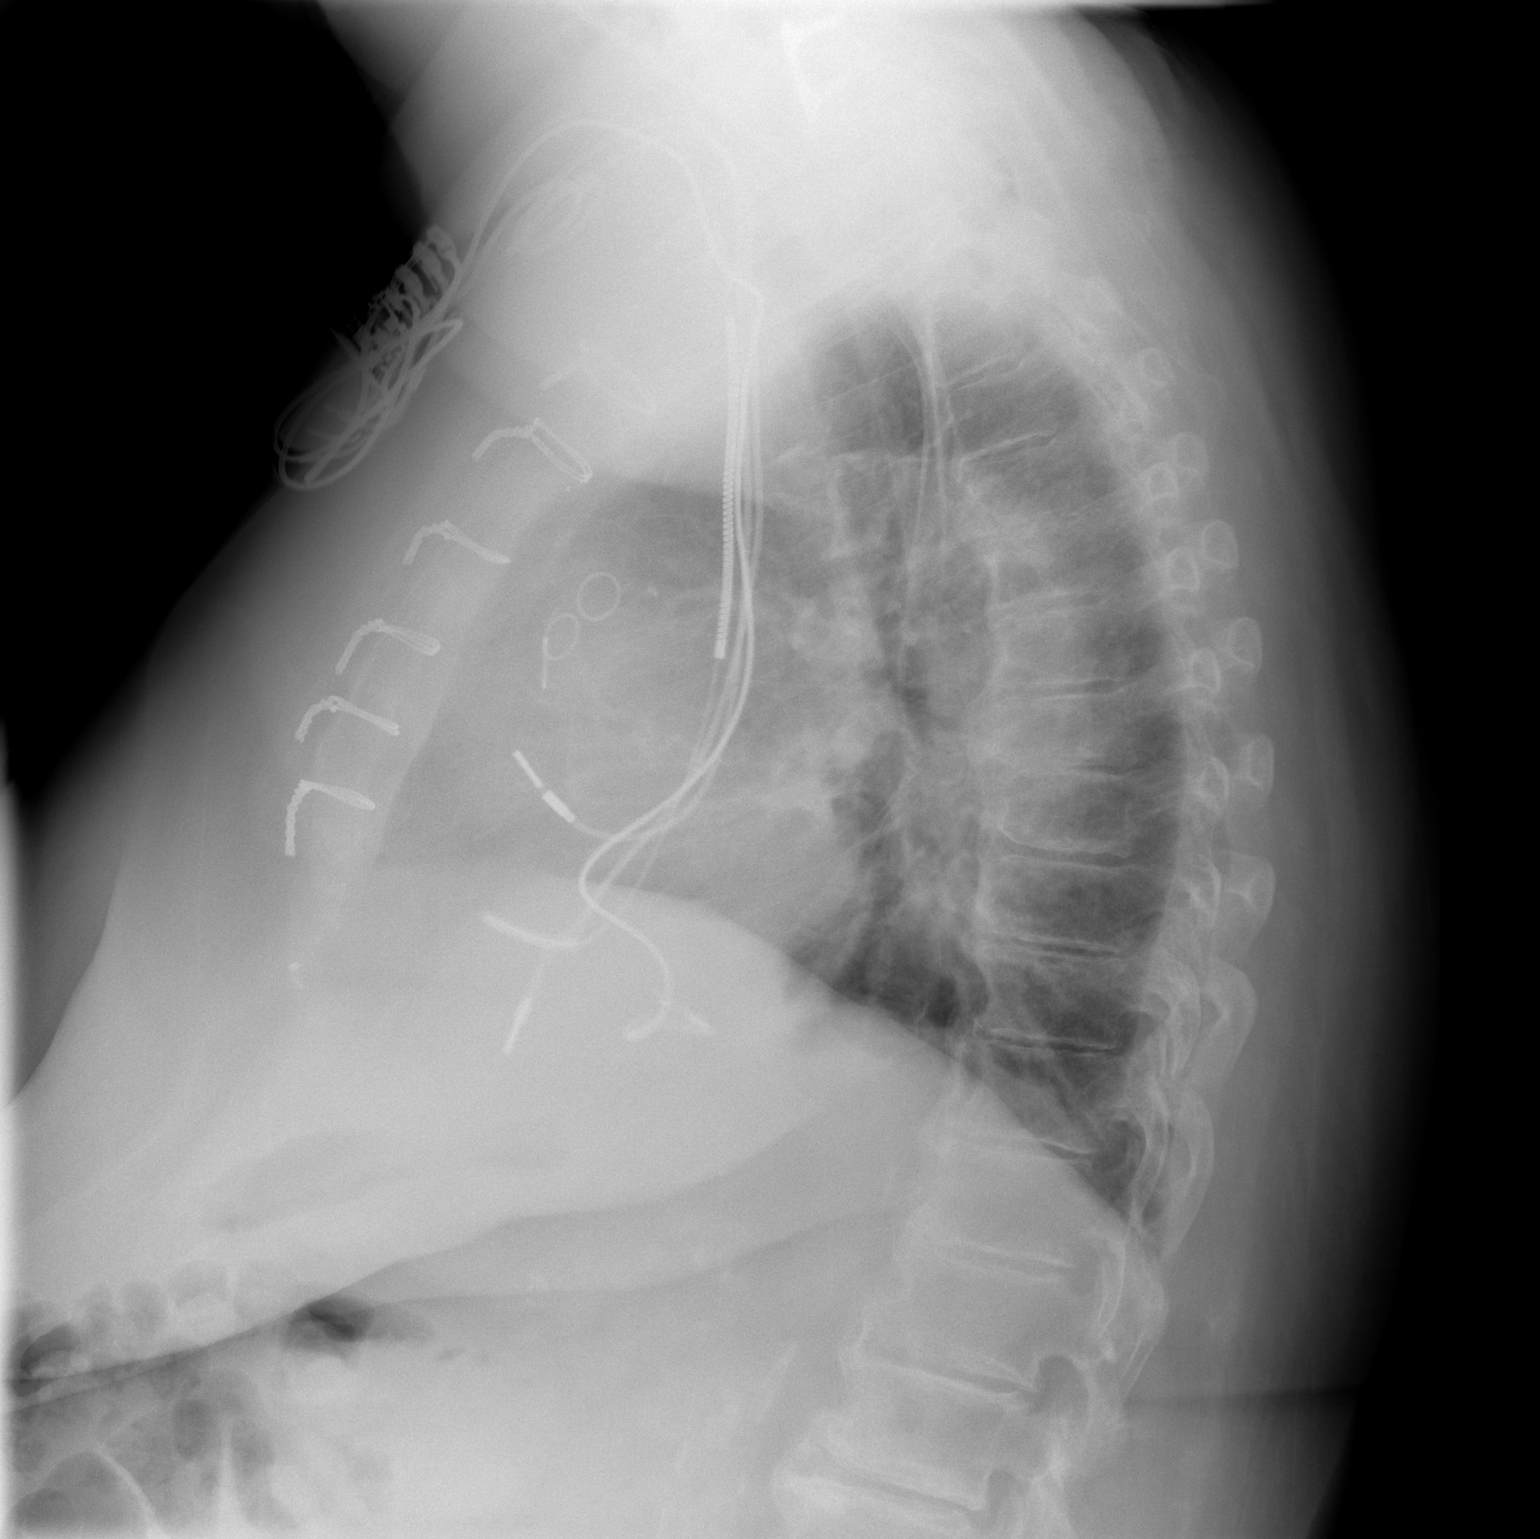

[2 of 2 positions shown; findings below may reference images not displayed]

FINDINGS: Aeration of the left lung base has improved with only minimal linear
atelectasis remaining at the left lung base. The right lung is
clear. Mild cardiomegaly is stable. Pacer and AICD leads remain.
Median sternotomy sutures are noted. There are degenerative changes
throughout the thoracic spine.
IMPRESSION: Improved aeration with only minimal linear atelectasis or scarring
at the left lung base. Stable cardiomegaly.

## 2016-01-08 ENCOUNTER — Ambulatory Visit (INDEPENDENT_AMBULATORY_CARE_PROVIDER_SITE_OTHER): Payer: Commercial Managed Care - HMO | Admitting: Orthopedic Surgery

## 2016-01-16 ENCOUNTER — Institutional Professional Consult (permissible substitution): Payer: Commercial Managed Care - HMO | Admitting: Pulmonary Disease

## 2016-01-17 ENCOUNTER — Institutional Professional Consult (permissible substitution): Payer: Commercial Managed Care - HMO | Admitting: Pulmonary Disease

## 2016-01-21 ENCOUNTER — Other Ambulatory Visit: Payer: Self-pay

## 2016-01-21 NOTE — Patient Outreach (Signed)
[place] [facility]) Care Management  01/21/2016  WAKISHA ALBERTS September 11, 1945 [id number]   Telephone call to patient for initial assessment.  No answer.  Unable to leave a message.  Plan: RN Health Coach will attempt patient again in the month of December.    Jone Baseman, RN, MSN [place] [phone]

## 2016-01-23 ENCOUNTER — Encounter: Payer: Commercial Managed Care - HMO | Admitting: Cardiovascular Disease

## 2016-01-23 ENCOUNTER — Encounter: Payer: Self-pay | Admitting: *Deleted

## 2016-01-23 ENCOUNTER — Other Ambulatory Visit: Payer: Self-pay

## 2016-01-23 NOTE — Patient Outreach (Signed)
[facility]) Care Management  01/23/2016  ISABEL ARDILA [birth year] [id number]   Telephone call to patient for initial assessment. Patient reports she cannot talk right now.  Advised patient to call health coach back as number provided.    Plan: RN Health Coach will wait patient return call.  If no return call will send letter to attempt outreach.   Jone Baseman, RN, MSN [place] [phone]

## 2016-01-29 ENCOUNTER — Ambulatory Visit (INDEPENDENT_AMBULATORY_CARE_PROVIDER_SITE_OTHER): Payer: Commercial Managed Care - HMO | Admitting: Orthopedic Surgery

## 2016-01-31 DIAGNOSIS — M79676 Pain in unspecified toe(s): Secondary | ICD-10-CM | POA: Diagnosis not present

## 2016-01-31 DIAGNOSIS — I2510 Atherosclerotic heart disease of native coronary artery without angina pectoris: Secondary | ICD-10-CM | POA: Diagnosis not present

## 2016-01-31 DIAGNOSIS — E1122 Type 2 diabetes mellitus with diabetic chronic kidney disease: Secondary | ICD-10-CM | POA: Diagnosis not present

## 2016-01-31 DIAGNOSIS — I700 Atherosclerosis of aorta: Secondary | ICD-10-CM | POA: Diagnosis not present

## 2016-01-31 DIAGNOSIS — E7800 Pure hypercholesterolemia, unspecified: Secondary | ICD-10-CM | POA: Diagnosis not present

## 2016-01-31 DIAGNOSIS — I5033 Acute on chronic diastolic (congestive) heart failure: Secondary | ICD-10-CM | POA: Diagnosis not present

## 2016-01-31 DIAGNOSIS — I10 Essential (primary) hypertension: Secondary | ICD-10-CM | POA: Diagnosis not present

## 2016-01-31 DIAGNOSIS — N644 Mastodynia: Secondary | ICD-10-CM | POA: Diagnosis not present

## 2016-02-06 ENCOUNTER — Other Ambulatory Visit: Payer: Self-pay

## 2016-02-06 NOTE — Patient Outreach (Signed)
[facility]) Care Management  02/06/2016  [name] 1945/05/31 [id number]   Patient has not responded to calls or letter.  Will proceed with case closure. Will notify care management assistant of case closure.    Jone Baseman, RN, MSN [place] [phone]

## 2016-02-09 IMAGING — DX DG CHEST 2V
2 series · 2 of 2 positions shown · non-contrast
Comparison: 01/09/2015

CLINICAL DATA: Chest pain, shortness of breath

EXAM:
CHEST  2 VIEW

[w chest pa]
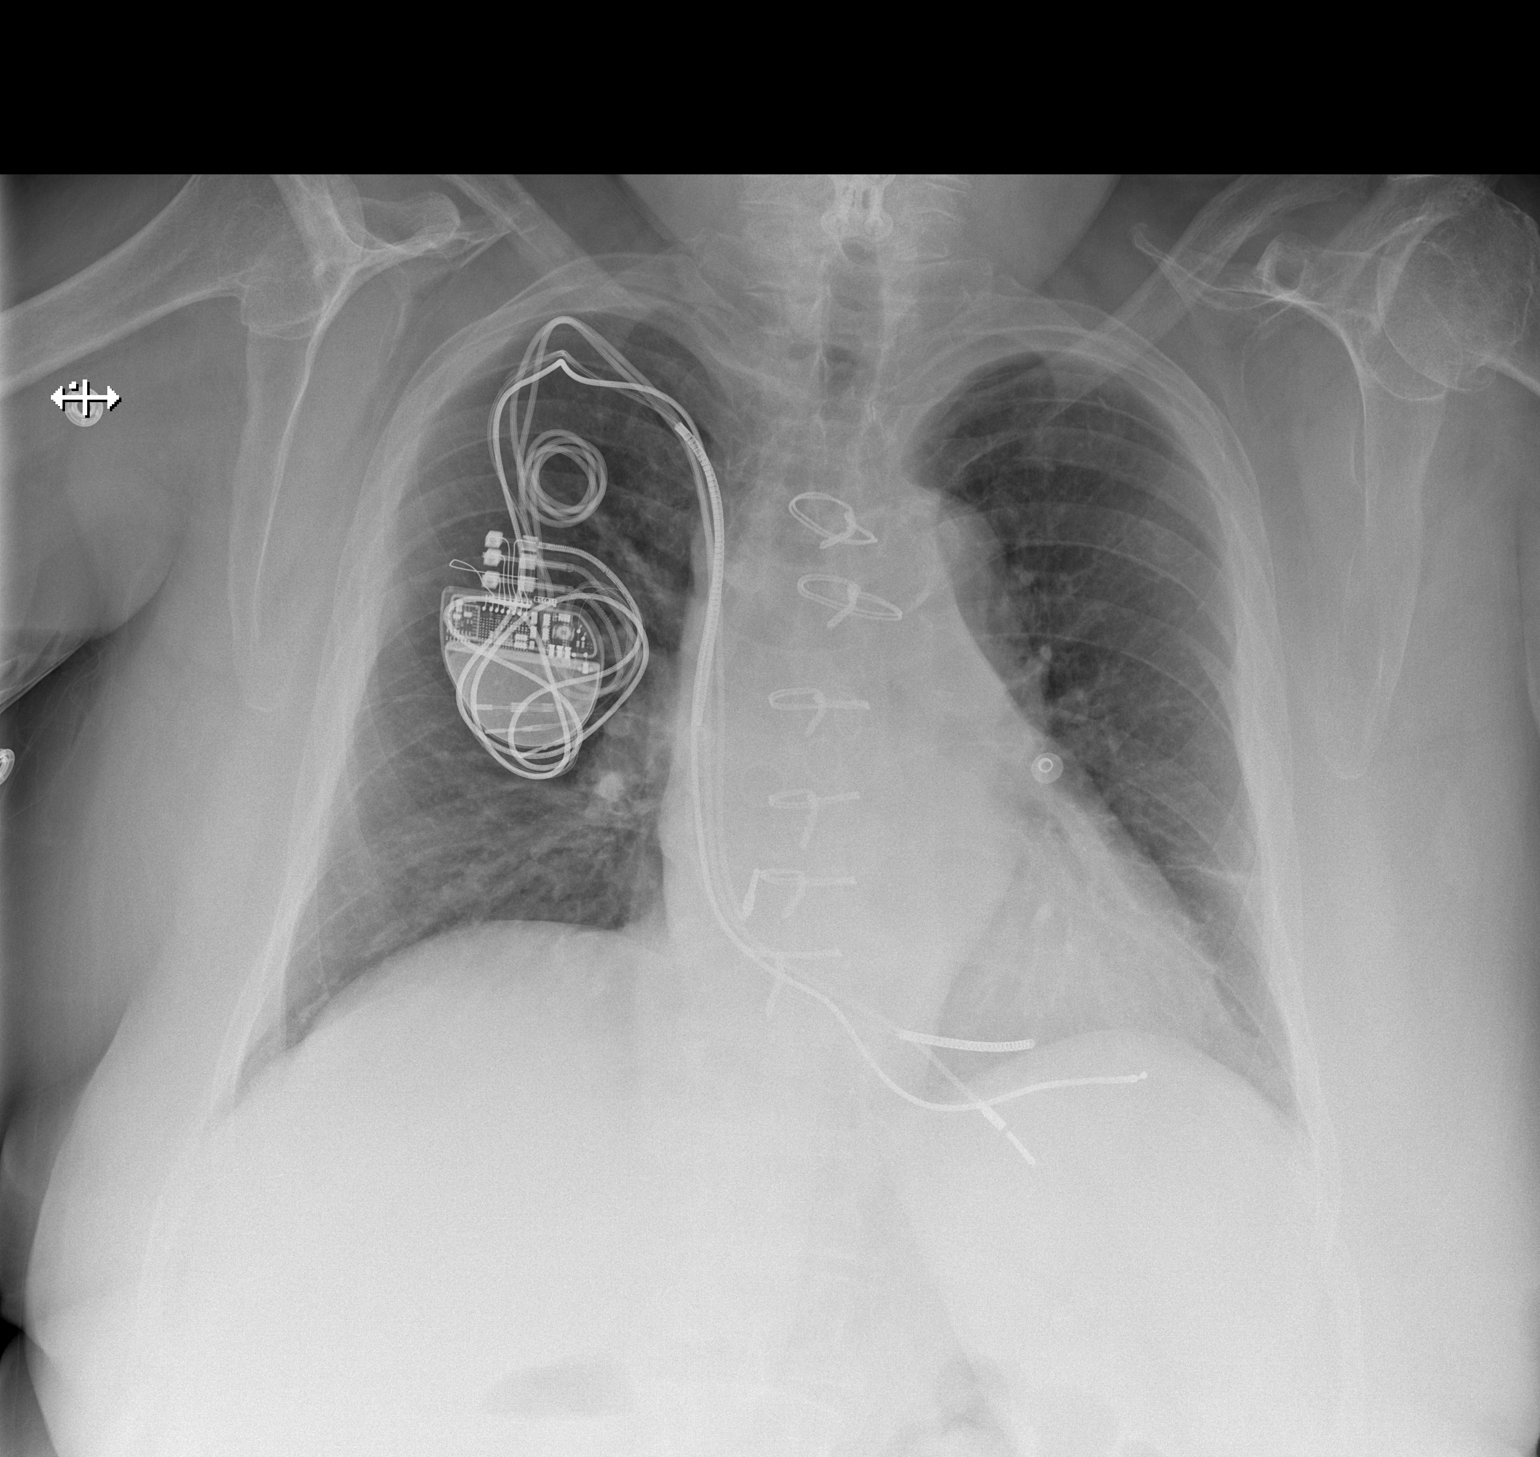

[w chest lat]
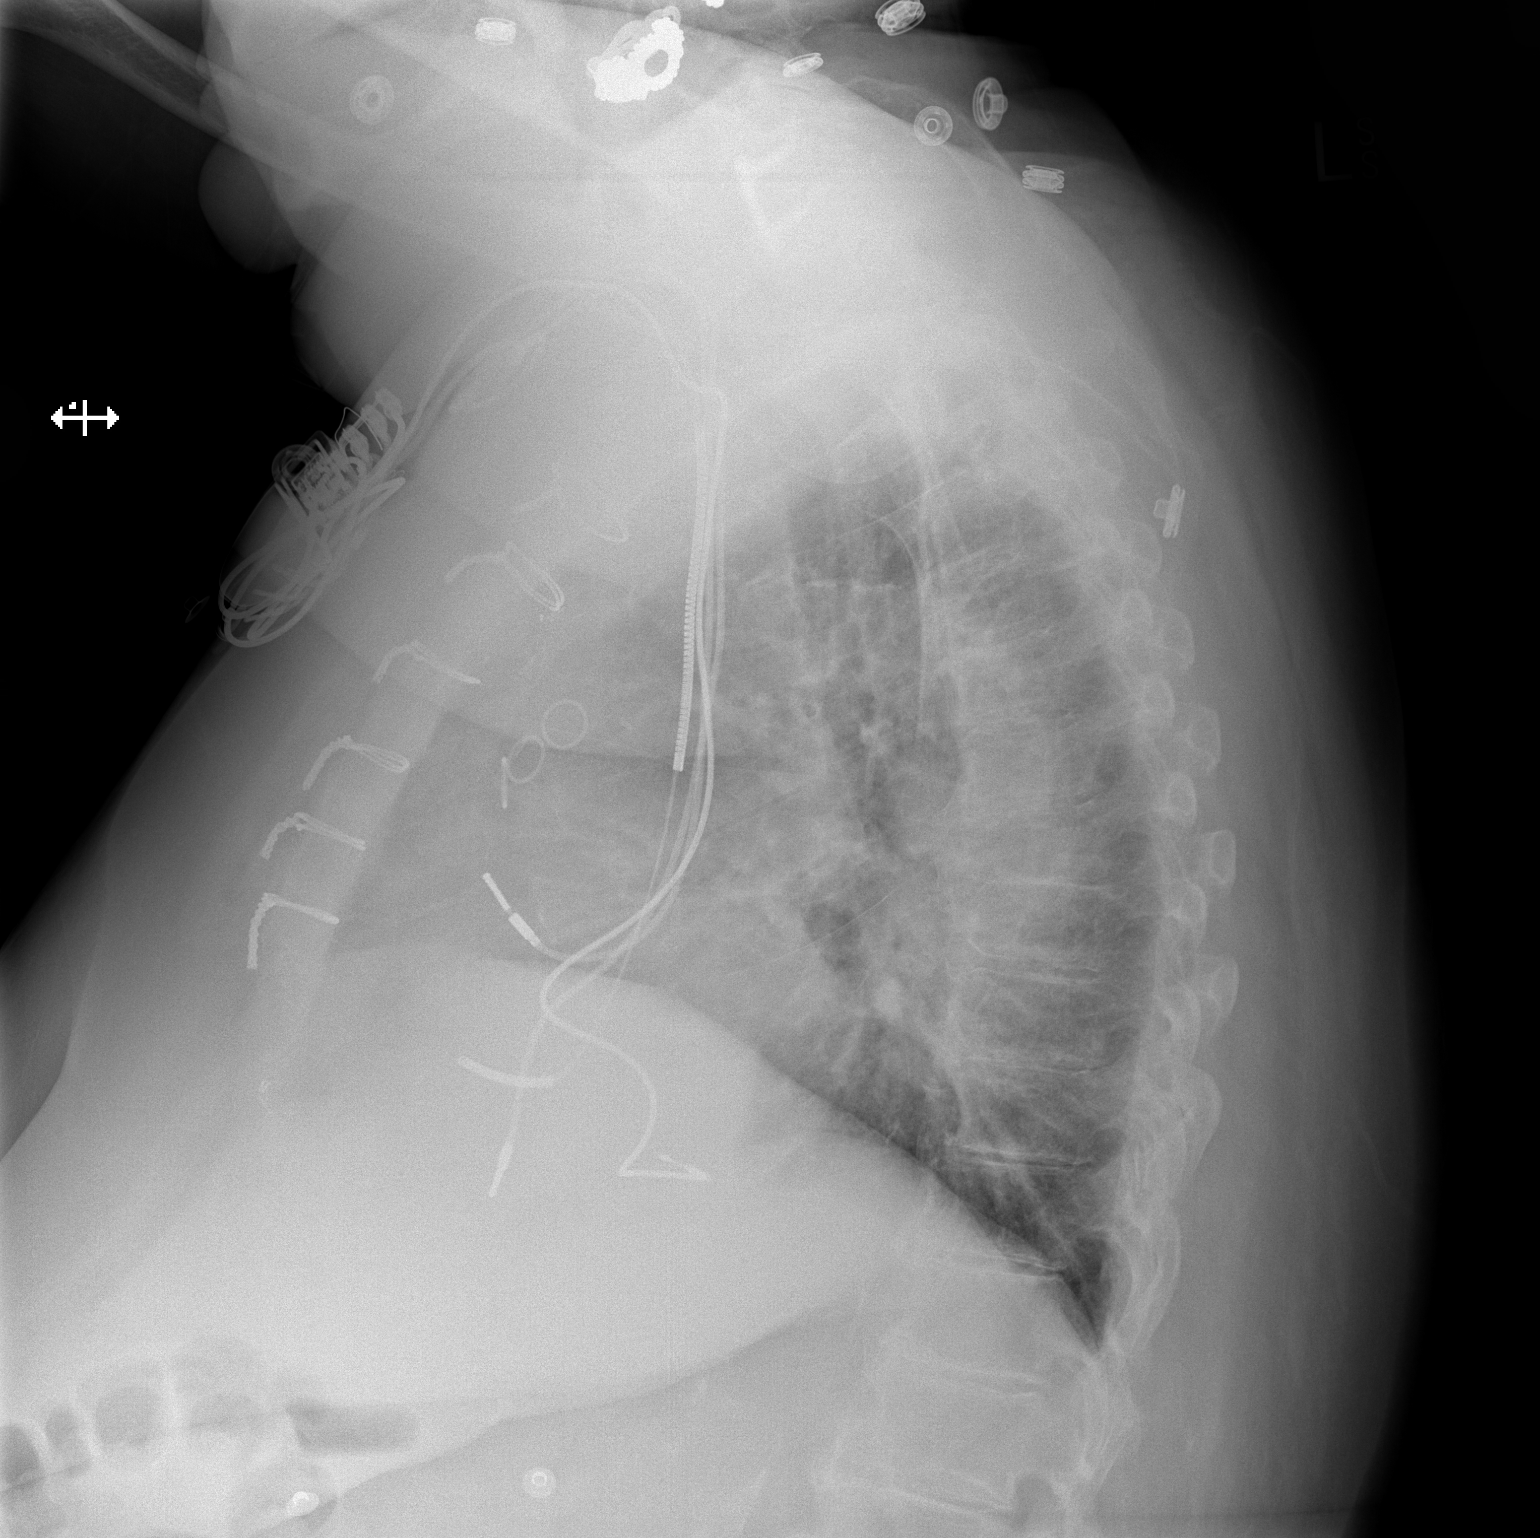

[2 of 2 positions shown; findings below may reference images not displayed]

FINDINGS: Cardiomediastinal silhouette is stable. Status post median
sternotomy. Four leads cardiac pacemaker is unchanged in position.
No acute infiltrate or pleural effusion. No pulmonary edema.
Osteopenia and mild degenerative changes thoracic spine.
IMPRESSION: No active cardiopulmonary disease. Stable 4 leads cardiac pacemaker
position.

## 2016-02-11 DIAGNOSIS — R200 Anesthesia of skin: Secondary | ICD-10-CM

## 2016-02-11 HISTORY — DX: Anesthesia of skin: R20.0

## 2016-02-25 ENCOUNTER — Encounter: Payer: Self-pay | Admitting: Pulmonary Disease

## 2016-02-26 ENCOUNTER — Ambulatory Visit (INDEPENDENT_AMBULATORY_CARE_PROVIDER_SITE_OTHER): Payer: Commercial Managed Care - HMO | Admitting: Pulmonary Disease

## 2016-02-26 ENCOUNTER — Encounter: Payer: Self-pay | Admitting: Pulmonary Disease

## 2016-02-26 ENCOUNTER — Other Ambulatory Visit (INDEPENDENT_AMBULATORY_CARE_PROVIDER_SITE_OTHER): Payer: Commercial Managed Care - HMO

## 2016-02-26 VITALS — BP 118/82 | HR 76 | Ht 65.0 in | Wt 243.2 lb

## 2016-02-26 DIAGNOSIS — D509 Iron deficiency anemia, unspecified: Secondary | ICD-10-CM

## 2016-02-26 DIAGNOSIS — G4733 Obstructive sleep apnea (adult) (pediatric): Secondary | ICD-10-CM

## 2016-02-26 DIAGNOSIS — J4530 Mild persistent asthma, uncomplicated: Secondary | ICD-10-CM | POA: Diagnosis not present

## 2016-02-26 LAB — IBC PANEL
Iron: 132 ug/dL (ref 42–145)
Saturation Ratios: 29.6 % (ref 20.0–50.0)
Transferrin: 318 mg/dL (ref 212.0–360.0)

## 2016-02-26 LAB — FERRITIN: Ferritin: 16.7 ng/mL (ref 10.0–291.0)

## 2016-02-26 LAB — VITAMIN B12: Vitamin B-12: 501 pg/mL (ref 211–911)

## 2016-02-26 MED ORDER — ALBUTEROL SULFATE HFA 108 (90 BASE) MCG/ACT IN AERS: 2.0000 | INHALATION_SPRAY | RESPIRATORY_TRACT | 3 refills | Status: DC | PRN

## 2016-02-26 NOTE — Assessment & Plan Note (Signed)
Stay on Symbicort for asthma

## 2016-02-26 NOTE — Progress Notes (Signed)
Subjective:    Patient ID: [name], female    DOB: 10/09/1945, 71 y.o.   MRN: [id number]  HPI   Chief Complaint  Patient presents with  . Sleep Consult    OSA. Cant remember when sleep study done. Having problems with sleeping at night.    71 year old obese woman presents for evaluation and management of obstructive sleep apnea. She has ischemic cardiomyopathy status post permanent pacemaker. She is also a diabetic and hypertensive, had a TIA in 10/2015 with paroxysmal atrial fibrillation  PSG 2008 showed severe OSA with AHI of 59/hour and lowest desaturations of 58%. CPAP titration was attempted but she could not tolerate this, and left this sleep Center in a panic. She was evaluated again by my partner in 11/2013 and was provided with a bilevel machine but could not use this at home went home desensitization was attempted, hence machine was again returned She underwent CABG 5 in 2016 [[place]] and BiPAP was attempted during this admission but she could not tolerate this again.  She now reports increasing somnolence and multiple nocturnal awakenings Epworth sleepiness score is 15 and she describes sleepiness and radius social situations. Bedtime is between 11 PM and midnight, sleep latency is about 30 minutes, she sleeps on her left side with 2 pillows, reports 3-4 nocturnal awakenings due to nocturia and is out of bed by 7 AM feeling tired with dryness of mouth and occasional headaches.  There is no history suggestive of cataplexy, sleep paralysis or parasomnias She is now willing to try CPAP therapy again  She has also been evaluated for asthma .she reports childhood history of asthma worse with seasonal changes and has been maintained on Symbicort without an exacerbation over the past 2 years .she denies nocturnal wheezing or excessive use of rescue inhaler     Significant tests/ events  10/10/2013 PFT's no sign airflow obst   Past Medical History:  Diagnosis Date    . Anemia   . Arthritis   . Asthma   . CAD (coronary artery disease)   . Cellulitis and abscess of foot 12/2014   RT FOOT  . CHF (congestive heart failure) ([place])   . Diabetes mellitus without complication ([place])   . Diverticulosis   . Fatty liver disease, nonalcoholic   . Gastritis   . GERD (gastroesophageal reflux disease)   . H/O hiatal hernia   . Hx of CABG   . Hyperplastic colon polyp   . Hypertension   . IBS (irritable bowel syndrome)   . ICD (implantable cardiac defibrillator) in place   . Internal hemorrhoids   . Ischemic cardiomyopathy   . Obesity   . OSA (obstructive sleep apnea)   . Shortness of breath      Past Surgical History:  Procedure Laterality Date  . ABDOMINAL ULTRASOUND  12/01/2011   Peripelvic cysts- #1- 2.4x1.9x2.3cm, #2-1.2x0.9x1.2cm  . ANKLE SURGERY     right  . APPENDECTOMY    . BIV PACEMAKER GENERATOR CHANGE OUT N/A 11/02/2012   Procedure: BIV PACEMAKER GENERATOR CHANGE OUT;  Surgeon: Sanda Klein, MD;  Location: [place] CATH LAB;  Service: Cardiovascular;  Laterality: N/A;  . CARDIAC CATHETERIZATION  05/17/1999   No significant coronary obstructive disease w/ mild 20% luminal irregularity of the first diag branch of the LAD  . CARDIAC CATHETERIZATION  07/08/2002   No significant CAD, moderately depressed LV systolic function  . CARDIAC CATHETERIZATION Bilateral 04/26/2007   Normal findings, recommend medical therapy  . CARDIAC CATHETERIZATION  02/18/2008  Moderate CAD, would benefit from having a functional study, recommend continue medical therapy  . CARDIAC CATHETERIZATION  07/23/2012   Medical therapy  . CARDIAC CATHETERIZATION N/A 11/24/2014   Procedure: Left Heart Cath and Coronary Angiography;  Surgeon: Troy Sine, MD;  Location: [place] [facility] CV LAB;  Service: Cardiovascular;  Laterality: N/A;  . CARDIAC CATHETERIZATION  11/27/2014   Procedure: Intravascular Pressure Wire/FFR Study;  Surgeon: Peter M Martinique, MD;  Location: [place] CV LAB;   Service: Cardiovascular;;  . CARDIAC DEFIBRILLATOR PLACEMENT    . CERVICAL SPINE SURGERY     plate   . CORONARY ANGIOGRAM  2010  . CORONARY ARTERY BYPASS GRAFT N/A 11/29/2014   Procedure: CORONARY ARTERY BYPASS GRAFTING x 5 (LIMA-LAD, SVG-D, SVG-OM1-OM2, SVG-PD);  Surgeon: Melrose Nakayama, MD;  Location: [facility];  Service: Open Heart Surgery;  Laterality: N/A;  . KNEE SURGERY     right  . LEFT HEART CATHETERIZATION WITH CORONARY ANGIOGRAM N/A 07/23/2012   Procedure: LEFT HEART CATHETERIZATION WITH CORONARY ANGIOGRAM;  Surgeon: Leonie Man, MD;  Location: [facility] CATH LAB;  Service: Cardiovascular;  Laterality: N/A;  Carlton Adam MYOVIEW  11/14/2011   Mild-moderate defect seen in Mid Inferolateral and Mid Anterolateral regions-consistant w/ infarct/scar. No significant ischemia demonstrated.  [name] PACEMAKER INSERTION  2001   BS BivICD  . TEE WITHOUT CARDIOVERSION N/A 11/29/2014   Procedure: TRANSESOPHAGEAL ECHOCARDIOGRAM (TEE);  Surgeon: Melrose Nakayama, MD;  Location: [place] [facility];  Service: Open Heart Surgery;  Laterality: N/A;  . TRANSTHORACIC ECHOCARDIOGRAM  07/23/2012   EF 55-60%, normal-mild  . TUBAL LIGATION     Allergies  Allergen Reactions  . Ivp Dye [Iodinated Diagnostic Agents] Shortness Of Breath    No reaction to PO contrast with non-ionic dye.06-25-2014/rsm  . Shellfish-Derived Products Anaphylaxis  . Sulfa Antibiotics Shortness Of Breath  . Iodine Hives  . Atorvastatin Other (See Comments)    Pt states "causes bilateral leg pain/cramps."  . Cephalexin Itching and Rash    Rash & itch after Keflex  . Zithromax [Azithromycin] Rash    Social History   Social History  . Marital status: Widowed    Spouse name: N/A  . Number of children: 5  . Years of education: N/A   Occupational History  . 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 Smoking status: Former Smoker    Packs/day: 0.25    Years: 3.00    Types: Cigarettes    Quit date: 02/11/1968    . Smokeless tobacco: Never Used  . Alcohol use No  . Drug use: No  . Sexual activity: No   Other Topics Concern  . Not on file   Social History Narrative   Widowed last year. She lives with her two sons.    Family History  Problem Relation Age of Onset  . Breast cancer Mother   . Diabetes Mother   . Heart disease Maternal Grandmother   . Kidney disease Maternal Grandmother   . Diabetes Maternal Grandmother   . Glaucoma Maternal Aunt   . Heart disease Maternal Aunt   . Asthma Sister      Review of Systems  Positive for shortness of breath with activity, acid heartburn and indigestion, occasional headaches and nasal congestion and feet swelling  Constitutional: negative for anorexia, fevers and sweats  Eyes: negative for irritation, redness and visual disturbance  Ears, nose, mouth, throat, and face: negative for earaches, epistaxis and sore throat  Respiratory: negative for cough,  sputum and wheezing  Cardiovascular: negative for chest pain, orthopnea, palpitations and syncope  Gastrointestinal: negative for abdominal pain, constipation, diarrhea, melena, nausea and vomiting  Genitourinary:negative for dysuria, frequency and hematuria  Hematologic/lymphatic: negative for bleeding, easy bruising and lymphadenopathy  Musculoskeletal:negative for arthralgias, muscle weakness and stiff joints  Neurological: negative for coordination problems, gait problems, headaches and weakness  Endocrine: negative for diabetic symptoms including polydipsia, polyuria and weight loss     Objective:   Physical Exam  Gen. Pleasant, obese, in no distress, normal affect ENT - no lesions, no post nasal drip, class 2-3 airway Neck: No JVD, no thyromegaly, no carotid bruits Lungs: no use of accessory muscles, no dullness to percussion, decreased without rales or rhonchi  Cardiovascular: Rhythm regular, heart sounds  normal, no murmurs or gallops, no peripheral edema Abdomen: soft and  non-tender, no hepatosplenomegaly, BS normal. Musculoskeletal: No deformities, no cyanosis or clubbing Neuro:  alert, non focal, no tremors       Assessment & Plan:

## 2016-02-26 NOTE — Patient Instructions (Signed)
Schedule CPAP titration study Based on this will also check with a new CPAP machine Expectation is that she'll use it at least 4 hours every night  Stay on Symbicort for asthma

## 2016-02-26 NOTE — Addendum Note (Signed)
Addended by: Valerie Salts on: 02/26/2016 02:37 PM   Modules accepted: Orders

## 2016-02-26 NOTE — Assessment & Plan Note (Addendum)
Schedule CPAP titration study -this will also provide [place] with the opportunity to perform desensitization Based on this we will get you started with a new CPAP machine Expectation is that she'll use it at least 4 hours every night  Weight loss encouraged, compliance with goal of at least 4-6 hrs every night is the expectation. Advised against medications with sedative side effects Cautioned against driving when sleepy - understanding that sleepiness will vary on a day to day basis

## 2016-03-04 ENCOUNTER — Encounter (HOSPITAL_COMMUNITY): Payer: Self-pay

## 2016-03-04 ENCOUNTER — Emergency Department (HOSPITAL_COMMUNITY): Payer: Medicare HMO

## 2016-03-04 ENCOUNTER — Observation Stay (HOSPITAL_COMMUNITY)
Admission: EM | Admit: 2016-03-04 | Discharge: 2016-03-06 | Disposition: A | Discharge status: Home / Self Care | Payer: Medicare HMO | Attending: Internal Medicine | Admitting: Internal Medicine

## 2016-03-04 DIAGNOSIS — I10 Essential (primary) hypertension: Secondary | ICD-10-CM | POA: Diagnosis present

## 2016-03-04 DIAGNOSIS — R29818 Other symptoms and signs involving the nervous system: Secondary | ICD-10-CM | POA: Diagnosis not present

## 2016-03-04 DIAGNOSIS — I255 Ischemic cardiomyopathy: Secondary | ICD-10-CM | POA: Insufficient documentation

## 2016-03-04 DIAGNOSIS — Z8673 Personal history of transient ischemic attack (TIA), and cerebral infarction without residual deficits: Secondary | ICD-10-CM | POA: Diagnosis not present

## 2016-03-04 DIAGNOSIS — R404 Transient alteration of awareness: Secondary | ICD-10-CM | POA: Diagnosis not present

## 2016-03-04 DIAGNOSIS — R0902 Hypoxemia: Secondary | ICD-10-CM | POA: Diagnosis not present

## 2016-03-04 DIAGNOSIS — I110 Hypertensive heart disease with heart failure: Secondary | ICD-10-CM | POA: Diagnosis not present

## 2016-03-04 DIAGNOSIS — N183 Chronic kidney disease, stage 3 unspecified: Secondary | ICD-10-CM

## 2016-03-04 DIAGNOSIS — G8191 Hemiplegia, unspecified affecting right dominant side: Secondary | ICD-10-CM

## 2016-03-04 DIAGNOSIS — I2510 Atherosclerotic heart disease of native coronary artery without angina pectoris: Secondary | ICD-10-CM | POA: Insufficient documentation

## 2016-03-04 DIAGNOSIS — R079 Chest pain, unspecified: Secondary | ICD-10-CM | POA: Diagnosis present

## 2016-03-04 DIAGNOSIS — J45909 Unspecified asthma, uncomplicated: Secondary | ICD-10-CM | POA: Diagnosis not present

## 2016-03-04 DIAGNOSIS — I639 Cerebral infarction, unspecified: Secondary | ICD-10-CM

## 2016-03-04 DIAGNOSIS — E7800 Pure hypercholesterolemia, unspecified: Secondary | ICD-10-CM | POA: Insufficient documentation

## 2016-03-04 DIAGNOSIS — K219 Gastro-esophageal reflux disease without esophagitis: Secondary | ICD-10-CM | POA: Diagnosis not present

## 2016-03-04 DIAGNOSIS — Z95810 Presence of automatic (implantable) cardiac defibrillator: Secondary | ICD-10-CM | POA: Insufficient documentation

## 2016-03-04 DIAGNOSIS — N186 End stage renal disease: Secondary | ICD-10-CM

## 2016-03-04 DIAGNOSIS — I5023 Acute on chronic systolic (congestive) heart failure: Secondary | ICD-10-CM

## 2016-03-04 DIAGNOSIS — R0600 Dyspnea, unspecified: Secondary | ICD-10-CM

## 2016-03-04 DIAGNOSIS — E781 Pure hyperglyceridemia: Secondary | ICD-10-CM | POA: Diagnosis not present

## 2016-03-04 DIAGNOSIS — N189 Chronic kidney disease, unspecified: Secondary | ICD-10-CM

## 2016-03-04 DIAGNOSIS — I429 Cardiomyopathy, unspecified: Secondary | ICD-10-CM

## 2016-03-04 DIAGNOSIS — G4733 Obstructive sleep apnea (adult) (pediatric): Secondary | ICD-10-CM | POA: Insufficient documentation

## 2016-03-04 DIAGNOSIS — Z87891 Personal history of nicotine dependence: Secondary | ICD-10-CM | POA: Diagnosis not present

## 2016-03-04 DIAGNOSIS — R200 Anesthesia of skin: Principal | ICD-10-CM | POA: Insufficient documentation

## 2016-03-04 DIAGNOSIS — R42 Dizziness and giddiness: Secondary | ICD-10-CM | POA: Diagnosis not present

## 2016-03-04 DIAGNOSIS — Z79899 Other long term (current) drug therapy: Secondary | ICD-10-CM | POA: Insufficient documentation

## 2016-03-04 DIAGNOSIS — E1122 Type 2 diabetes mellitus with diabetic chronic kidney disease: Secondary | ICD-10-CM | POA: Diagnosis not present

## 2016-03-04 DIAGNOSIS — Z951 Presence of aortocoronary bypass graft: Secondary | ICD-10-CM | POA: Diagnosis not present

## 2016-03-04 DIAGNOSIS — I428 Other cardiomyopathies: Secondary | ICD-10-CM

## 2016-03-04 DIAGNOSIS — I5032 Chronic diastolic (congestive) heart failure: Secondary | ICD-10-CM | POA: Insufficient documentation

## 2016-03-04 DIAGNOSIS — Z6841 Body Mass Index (BMI) 40.0 and over, adult: Secondary | ICD-10-CM | POA: Diagnosis not present

## 2016-03-04 DIAGNOSIS — R072 Precordial pain: Secondary | ICD-10-CM | POA: Diagnosis not present

## 2016-03-04 DIAGNOSIS — Z7902 Long term (current) use of antithrombotics/antiplatelets: Secondary | ICD-10-CM | POA: Diagnosis not present

## 2016-03-04 DIAGNOSIS — R531 Weakness: Secondary | ICD-10-CM | POA: Diagnosis not present

## 2016-03-04 DIAGNOSIS — I130 Hypertensive heart and chronic kidney disease with heart failure and stage 1 through stage 4 chronic kidney disease, or unspecified chronic kidney disease: Secondary | ICD-10-CM | POA: Diagnosis not present

## 2016-03-04 DIAGNOSIS — M79643 Pain in unspecified hand: Secondary | ICD-10-CM | POA: Diagnosis not present

## 2016-03-04 DIAGNOSIS — E669 Obesity, unspecified: Secondary | ICD-10-CM | POA: Diagnosis present

## 2016-03-04 HISTORY — DX: Anesthesia of skin: R20.0

## 2016-03-04 LAB — COMPREHENSIVE METABOLIC PANEL
ALT: 30 U/L (ref 14–54)
AST: 25 U/L (ref 15–41)
Albumin: 3.5 g/dL (ref 3.5–5.0)
Alkaline Phosphatase: 118 U/L (ref 38–126)
Anion gap: 7 (ref 5–15)
BUN: 17 mg/dL (ref 6–20)
CO2: 30 mmol/L (ref 22–32)
Calcium: 9.3 mg/dL (ref 8.9–10.3)
Chloride: 104 mmol/L (ref 101–111)
Creatinine, Ser: 1.28 mg/dL — ABNORMAL HIGH (ref 0.44–1.00)
GFR calc Af Amer: 48 mL/min — ABNORMAL LOW (ref >60)
GFR calc non Af Amer: 41 mL/min — ABNORMAL LOW (ref >60)
Glucose, Bld: 110 mg/dL — ABNORMAL HIGH (ref 65–99)
Potassium: 3.9 mmol/L (ref 3.5–5.1)
Sodium: 141 mmol/L (ref 135–145)
Total Bilirubin: 0.6 mg/dL (ref 0.3–1.2)
Total Protein: 6.3 g/dL — ABNORMAL LOW (ref 6.5–8.1)

## 2016-03-04 LAB — CBC
HCT: 44.5 % (ref 36.0–46.0)
Hemoglobin: 14.2 g/dL (ref 12.0–15.0)
MCH: 28.7 pg (ref 26.0–34.0)
MCHC: 31.9 g/dL (ref 30.0–36.0)
MCV: 89.9 fL (ref 78.0–100.0)
Platelets: 157 10*3/uL (ref 150–400)
RBC: 4.95 MIL/uL (ref 3.87–5.11)
RDW: 14 % (ref 11.5–15.5)
WBC: 5.9 10*3/uL (ref 4.0–10.5)

## 2016-03-04 LAB — I-STAT CHEM 8, ED
BUN: 21 mg/dL — ABNORMAL HIGH (ref 6–20)
Calcium, Ion: 1.08 mmol/L — ABNORMAL LOW (ref 1.15–1.40)
Chloride: 102 mmol/L (ref 101–111)
Creatinine, Ser: 1.4 mg/dL — ABNORMAL HIGH (ref 0.44–1.00)
Glucose, Bld: 110 mg/dL — ABNORMAL HIGH (ref 65–99)
HCT: 44 % (ref 36.0–46.0)
Hemoglobin: 15 g/dL (ref 12.0–15.0)
Potassium: 3.8 mmol/L (ref 3.5–5.1)
Sodium: 141 mmol/L (ref 135–145)
TCO2: 31 mmol/L (ref 0–100)

## 2016-03-04 LAB — I-STAT TROPONIN, ED
Troponin i, poc: 0.01 ng/mL (ref 0.00–0.08)
Troponin i, poc: 0 ng/mL (ref 0.00–0.08)

## 2016-03-04 LAB — DIFFERENTIAL
Basophils Absolute: 0.1 10*3/uL (ref 0.0–0.1)
Basophils Relative: 1 %
Eosinophils Absolute: 0.2 10*3/uL (ref 0.0–0.7)
Eosinophils Relative: 3 %
Lymphocytes Relative: 32 %
Lymphs Abs: 1.9 10*3/uL (ref 0.7–4.0)
Monocytes Absolute: 0.5 10*3/uL (ref 0.1–1.0)
Monocytes Relative: 9 %
Neutro Abs: 3.3 10*3/uL (ref 1.7–7.7)
Neutrophils Relative %: 55 %

## 2016-03-04 LAB — PROTIME-INR
INR: 0.94
Prothrombin Time: 12.6 seconds (ref 11.4–15.2)

## 2016-03-04 LAB — BRAIN NATRIURETIC PEPTIDE: B Natriuretic Peptide: 173.2 pg/mL — ABNORMAL HIGH (ref 0.0–100.0)

## 2016-03-04 LAB — APTT: aPTT: 31 seconds (ref 24–36)

## 2016-03-04 MED ORDER — KETOROLAC TROMETHAMINE 15 MG/ML IJ SOLN
10.0000 mg | Freq: Once | INTRAMUSCULAR | Status: AC
  Administered 2016-03-04: 10 mg via INTRAVENOUS
  Filled 2016-03-04: qty 1

## 2016-03-04 MED ORDER — SODIUM CHLORIDE 0.9 % IV BOLUS (SEPSIS)
500.0000 mL | Freq: Once | INTRAVENOUS | Status: AC
  Administered 2016-03-04: 500 mL via INTRAVENOUS

## 2016-03-04 NOTE — ED Provider Notes (Signed)
[place] DEPT Provider Note   CSN: [id number] Arrival date & time: 03/04/16  1739   An emergency department physician performed an initial assessment on this suspected stroke patient at 1810.  History   Chief Complaint Chief Complaint  Patient presents with  . Numbness    Numbness and tingling in the R arm up to the elbow started at 1500 today pt has a previous hx of TIA     HPI [name] is a 71 y.o. female.  HPI 71 y.o.comes in with cc of right sided numbness. PT has medical history of CAD s/p CABG October 2016, heart block s/p bivent pacer 2008, chronic CHF pEF, morbid obesity, adrenal insufficiency, T2DM, HTN, stage III CKD, asthma, and OSA. Pt reports that around 3 pm she started having tingling to the R side of her face and her arm. Pt has no hx of strokes, but has had TIA in the past. Pt denies any speech slurring. She has not had symptoms on the R side in the past. Pt is also having chest pain. Chest pain is substernal and non radiating, and started about the same time. Pt has CHF hx. She denies any new cough, flu like symptoms. Pt has some associated DIB.  Past Medical History:  Diagnosis Date  . Anemia   . Arthritis   . Asthma   . CAD (coronary artery disease)   . Cellulitis and abscess of foot 12/2014   RT FOOT  . CHF (congestive heart failure) ([place])   . Diabetes mellitus without complication ([place])   . Diverticulosis   . Fatty liver disease, nonalcoholic   . Gastritis   . GERD (gastroesophageal reflux disease)   . H/O hiatal hernia   . Hx of CABG   . Hyperplastic colon polyp   . Hypertension   . IBS (irritable bowel syndrome)   . ICD (implantable cardiac defibrillator) in place   . Internal hemorrhoids   . Ischemic cardiomyopathy   . Obesity   . OSA (obstructive sleep apnea)   . Shortness of breath     Patient Active Problem List   Diagnosis Date Noted  . Right facial numbness 03/05/2016  . Atrial fibrillation ([place]) 10/24/2015  . Chest  pain 10/23/2015  . TIA (transient ischemic attack) 10/22/2015  . Complete heart block ([place]) 06/22/2015  . Allergic drug rash due to anti-infective agent 04/29/2015  . Fatigue 02/14/2015  . Chronic diastolic heart failure ([place]) 02/14/2015  . Cellulitis 12/21/2014  . S/P CABG x 5 11/29/2014  . CAD, multiple vessel   . Acute diastolic HF (heart failure) ([place]) 11/23/2014  . Diarrhea 07/12/2014  . Generalized abdominal pain 07/12/2014  . Diastolic CHF, acute on chronic (HCC) 11/04/2013  . Mixed hypercholesterolemia and hypertriglyceridemia 04/22/2013  . Vertigo 04/22/2013  . Dyspnea 08/25/2012  . Allergic to IV contrast 07/23/2012  . Unstable angina ([place]) 07/22/2012  . Morbid obesity due to excess calories ([place]) 11/22/2011  . Cardiomyopathy, nonischemic, Bi V responder,  EF 65-70% echo Oct 2015 11/22/2011  . GERD (gastroesophageal reflux disease) 11/22/2011  . Back pain 11/22/2011  . OSA (obstructive sleep apnea) 11/22/2011  . HEMORRHOIDS-EXTERNAL 02/21/2010  . NAUSEA 02/21/2010  . ABDOMINAL PAIN -GENERALIZED 02/21/2010  . PERSONAL HX COLONIC POLYPS 02/21/2010  . ANEMIA 04/16/2007  . Essential hypertension 04/16/2007  . Coronary atherosclerosis 04/16/2007  . DIVERTICULOSIS, COLON 04/16/2007  . ARTHRITIS 04/16/2007  . INTERNAL HEMORRHOIDS WITHOUT MENTION COMP 04/12/2007  . Asthma 04/12/2007  . Cardiac resynchronization therapy pacemaker (CRT-P) in  place 04/12/2007  . ACUTE GASTRITIS WITHOUT MENTION OF HEMORRHAGE 12/14/2006  . COLONIC POLYPS, HYPERPLASTIC 09/27/2002  . FATTY LIVER DISEASE 02/16/2002    Past Surgical History:  Procedure Laterality Date  . ABDOMINAL ULTRASOUND  12/01/2011   Peripelvic cysts- #1- 2.4x1.9x2.3cm, #2-1.2x0.9x1.2cm  . ANKLE SURGERY     right  . APPENDECTOMY    . BIV PACEMAKER GENERATOR CHANGE OUT N/A 11/02/2012   Procedure: BIV PACEMAKER GENERATOR CHANGE OUT;  Surgeon: Sanda Klein, MD;  Location: [facility] CATH LAB;  Service: Cardiovascular;  Laterality:  N/A;  . CARDIAC CATHETERIZATION  05/17/1999   No significant coronary obstructive disease w/ mild 20% luminal irregularity of the first diag branch of the LAD  . CARDIAC CATHETERIZATION  07/08/2002   No significant CAD, moderately depressed LV systolic function  . CARDIAC CATHETERIZATION Bilateral 04/26/2007   Normal findings, recommend medical therapy  . CARDIAC CATHETERIZATION  02/18/2008   Moderate CAD, would benefit from having a functional study, recommend continue medical therapy  . CARDIAC CATHETERIZATION  07/23/2012   Medical therapy  . CARDIAC CATHETERIZATION N/A 11/24/2014   Procedure: Left Heart Cath and Coronary Angiography;  Surgeon: Troy Sine, MD;  Location: [facility] CV LAB;  Service: Cardiovascular;  Laterality: N/A;  . CARDIAC CATHETERIZATION  11/27/2014   Procedure: Intravascular Pressure Wire/FFR Study;  Surgeon: Peter M Martinique, MD;  Location: [facility];  Service: Cardiovascular;;  . CARDIAC DEFIBRILLATOR PLACEMENT    . CERVICAL SPINE SURGERY     plate   . CORONARY ANGIOGRAM  2010  . CORONARY ARTERY BYPASS GRAFT N/A 11/29/2014   Procedure: CORONARY ARTERY BYPASS GRAFTING x 5 (LIMA-LAD, SVG-D, SVG-OM1-OM2, SVG-PD);  Surgeon: Melrose Nakayama, MD;  Location: [facility];  Service: Open Heart Surgery;  Laterality: N/A;  . KNEE SURGERY     right  . LEFT HEART CATHETERIZATION WITH CORONARY ANGIOGRAM N/A 07/23/2012   Procedure: LEFT HEART CATHETERIZATION WITH CORONARY ANGIOGRAM;  Surgeon: Leonie Man, MD;  Location: [facility] CATH LAB;  Service: Cardiovascular;  Laterality: N/A;  Carlton Adam MYOVIEW  11/14/2011   Mild-moderate defect seen in Mid Inferolateral and Mid Anterolateral regions-consistant w/ infarct/scar. No significant ischemia demonstrated.  [name] PACEMAKER INSERTION  2001   BS BivICD  . TEE WITHOUT CARDIOVERSION N/A 11/29/2014   Procedure: TRANSESOPHAGEAL ECHOCARDIOGRAM (TEE);  Surgeon: Melrose Nakayama, MD;  Location: [facility];  Service: Open Heart Surgery;   Laterality: N/A;  . TRANSTHORACIC ECHOCARDIOGRAM  07/23/2012   EF 55-60%, normal-mild  . TUBAL LIGATION      OB History    No data available       Home Medications    Prior to Admission medications   Medication Sig Start Date End Date Taking? Authorizing Provider  acetaminophen (TYLENOL) 500 MG tablet Take 1,000 mg by mouth every 6 (six) hours as needed (for pain.).   Yes Historical Provider, MD  albuterol (PROVENTIL HFA;VENTOLIN HFA) 108 (90 Base) MCG/ACT inhaler Inhale 2 puffs into the lungs every 4 (four) hours as needed for wheezing (wheezing). 02/26/16  Yes Rigoberto Noel, MD  carvedilol (COREG) 6.25 MG tablet Take 1 tablet (6.25 mg total) by mouth 2 (two) times daily with a meal. 12/07/15  Yes Almyra Deforest, PA  cetirizine (ZYRTEC) 10 MG tablet Take 10 mg by mouth daily.    Yes Historical Provider, MD  clopidogrel (PLAVIX) 75 MG tablet Take 1 tablet (75 mg total) by mouth daily. 12/07/15  Yes Almyra Deforest, PA  clotrimazole (MYCELEX) 10 MG troche Take  10 mg by mouth every 4 (four) hours as needed (mouth pain).   Yes Historical Provider, MD  colchicine 0.6 MG tablet Take 0.6 mg by mouth 2 (two) times daily.   Yes Historical Provider, MD  EPINEPHrine 0.3 mg/0.3 mL IJ SOAJ injection Inject 0.3 mg into the muscle daily as needed (allergic reaction).  10/17/15  Yes Historical Provider, MD  fluorometholone (FML) 0.1 % ophthalmic suspension Place 1 drop into both eyes 2 (two) times daily. 08/22/15  Yes Historical Provider, MD  fluticasone (FLONASE) 50 MCG/ACT nasal spray Place 1 spray into the nose 2 (two) times daily as needed for rhinitis or allergies.    Yes Historical Provider, MD  isosorbide mononitrate (IMDUR) 30 MG 24 hr tablet Take 1 tablet (30 mg total) by mouth daily. 10/19/15 10/13/16 Yes [name], MD  omeprazole (PRILOSEC) 40 MG capsule Take 1 capsule (40 mg total) by mouth 2 (two) times daily before a meal. 07/12/15  Yes Kavitha Nandigam V, MD  potassium chloride SA (KLOR-CON M20) 20 MEQ  tablet Take 1 tablet (20 mEq total) by mouth daily. 12/07/15  Yes Almyra Deforest, PA  rosuvastatin (CRESTOR) 20 MG tablet Take 1 tablet (20 mg total) by mouth daily. 12/28/15 03/27/16 Yes [name], MD  spironolactone (ALDACTONE) 25 MG tablet Take 1 tablet (25 mg total) by mouth daily. 12/28/15  Yes [name], MD  SYMBICORT 160-4.5 MCG/ACT inhaler Inhale 2 puffs into the lungs 2 (two) times daily. 12/02/13  Yes Historical Provider, MD  torsemide (DEMADEX) 20 MG tablet Take 2 tablets (40 mg total) by mouth 2 (two) times daily. 12/07/15  Yes Almyra Deforest, PA  triamcinolone cream (KENALOG) 0.1 % Apply 1 application topically daily as needed (applied to affected itchy area(s)).  08/24/14  Yes Historical Provider, MD    Family History Family History  Problem Relation Age of Onset  . Breast cancer Mother   . Diabetes Mother   . Heart disease Maternal Grandmother   . Kidney disease Maternal Grandmother   . Diabetes Maternal Grandmother   . Glaucoma Maternal Aunt   . Heart disease Maternal Aunt   . Asthma Sister     Social History Social History  Substance Use Topics  . Smoking status: Former Smoker    Packs/day: 0.25    Years: 3.00    Types: Cigarettes    Quit date: 02/11/1968  . Smokeless tobacco: Never Used  . Alcohol use No     Allergies   Ivp dye [iodinated diagnostic agents]; Shellfish-derived products; Sulfa antibiotics; Uloric [febuxostat]; Iodine; Atorvastatin; Cephalexin; and Zithromax [azithromycin]   Review of Systems Review of Systems   ROS 10 Systems reviewed and are negative for acute change except as noted in the HPI.     Physical Exam Updated Vital Signs BP 159/75   Pulse 67   Temp 98.9 F (37.2 C) (Oral)   Resp 25   Ht 5\' 5"  (1.651 m)   Wt 239 lb (108.4 kg)   SpO2 91%   BMI 39.77 kg/m   Physical Exam  Constitutional: She is oriented to person, place, and time. She appears well-developed and well-nourished.  HENT:  Head: Normocephalic and  atraumatic.  Eyes: EOM are normal. Pupils are equal, round, and reactive to light.  Neck: Neck supple.  Cardiovascular: Normal rate, regular rhythm and normal heart sounds.   Pulmonary/Chest: Effort normal. No respiratory distress.  Abdominal: Soft. She exhibits no distension. There is no tenderness. There is no rebound and no guarding.  Neurological: She  is alert and oriented to person, place, and time.  Cerebellar exam is normal (finger to nose) Pt has R sided facial and upper extremity numbness, subjective. Motor exam is 4+/5 Grip-  Weaker on the R side  NIHSS -1  Skin: Skin is warm and dry.  Nursing note and vitals reviewed.    ED Treatments / Results  Labs (all labs ordered are listed, but only abnormal results are displayed) Labs Reviewed  COMPREHENSIVE METABOLIC PANEL - Abnormal; Notable for the following:       Result Value   Glucose, Bld 110 (*)    Creatinine, Ser 1.28 (*)    Total Protein 6.3 (*)    GFR calc non Af Amer 41 (*)    GFR calc Af Amer 48 (*)    All other components within normal limits  BRAIN NATRIURETIC PEPTIDE - Abnormal; Notable for the following:    B Natriuretic Peptide 173.2 (*)    All other components within normal limits  I-STAT CHEM 8, ED - Abnormal; Notable for the following:    BUN 21 (*)    Creatinine, Ser 1.40 (*)    Glucose, Bld 110 (*)    Calcium, Ion 1.08 (*)    All other components within normal limits  PROTIME-INR  APTT  CBC  DIFFERENTIAL  I-STAT TROPOININ, ED  I-STAT TROPOININ, ED  CBG MONITORING, ED    EKG  EKG Interpretation  Date/Time:  Tuesday March 04 2016 17:45:47 EST Ventricular Rate:  73 PR Interval:    QRS Duration: 169 QT Interval:  465 QTC Calculation: 513 R Axis:   -48 Text Interpretation:  Sinus rhythm RBBB and LAFB Probable left ventricular hypertrophy No significant change since last tracing Confirmed by Kathrynn Humble, MD, Thelma Comp [phone]) on 03/04/2016 6:31:44 PM       Radiology Dg Chest Port 1  View  Result Date: 03/04/2016 CLINICAL DATA:  Right arm weakness. Coronary artery disease. Pacemaker. EXAM: PORTABLE CHEST 1 VIEW COMPARISON:  10/23/2015 FINDINGS: Heart size and mediastinal contours are stable. Aortic atherosclerosis. Transvenous pacemaker is unchanged in position. Prior CABG. Both lungs are clear. No evidence of pneumothorax or pleural effusion. IMPRESSION: Stable exam.  No active disease. Electronically Signed   By: Earle Gell M.D.   On: 03/04/2016 21:44   Ct Head Code Stroke Wo Contrast`  Result Date: 03/04/2016 CLINICAL DATA:  Code stroke. RIGHT-sided weakness. Last seen normal at 1500 hours. Assess stroke. History of hypertension, diabetes, TIA. EXAM: CT HEAD WITHOUT CONTRAST TECHNIQUE: Contiguous axial images were obtained from the base of the skull through the vertex without intravenous contrast. COMPARISON:  CT HEAD October 24, 2015 FINDINGS: BRAIN: The ventricles and sulci are normal for age. No intraparenchymal hemorrhage, mass effect nor midline shift. Patchy supratentorial white matter hypodensities within normal range for patient's age, though non-specific are most compatible with chronic small vessel ischemic disease. Old LEFT caudate lacunar infarct. No acute large vascular territory infarcts. No abnormal extra-axial fluid collections. Basal cisterns are patent. VASCULAR: Moderate calcific atherosclerosis of the carotid siphons. SKULL: No skull fracture. No significant scalp soft tissue swelling. SINUSES/ORBITS: The mastoid air-cells and included paranasal sinuses are well-aerated. Status post bilateral ocular lens implants. The included ocular globes and orbital contents are non-suspicious. OTHER: Patient is edentulous. ASPECTS [facility] Stroke Program Early CT Score) - Ganglionic level infarction (caudate, lentiform nuclei, internal capsule, insula, M1-M3 cortex): 7 - Supraganglionic infarction (M4-M6 cortex): 3 Total score (0-10 with 10 being normal): 10 IMPRESSION: 1. No  acute intracranial process. Old  LEFT basal ganglia lacunar infarct and mild to moderate chronic small vessel ischemic disease. 2. ASPECTS is 10. Dr. Shon Hale paged March 04, 2016 at 18:32hours, pending return call. Electronically Signed   By: Elon Alas M.D.   On: 03/04/2016 18:33    Procedures Procedures (including critical care time)  Medications Ordered in ED Medications  sodium chloride 0.9 % bolus 500 mL (500 mLs Intravenous New Bag/Given 03/04/16 2151)  ketorolac (TORADOL) 15 MG/ML injection 10 mg (10 mg Intravenous Given 03/04/16 2149)     Initial Impression / Assessment and Plan / ED Course  I have reviewed the triage vital signs and the nursing notes.  Pertinent labs & imaging results that were available during my care of the patient were reviewed by me and considered in my medical decision making (see chart for details).     Pt comes in with R sided numbness. Pt has multiple medical co morbidities, and high stroke risk factors, and clinical concerns are for stroke. Pt is outside the TPA window. Code stroke - neuro saw the patient, but they think the symptoms are not neurologic in nature.  I reassessed the patient. There is no neck pain, or radicular component to the pain. Pt has persistent subjective numbness, though it has improved. Pt has seen Neurologist in the past, and it seems like her TIA workup was not complete. We will admit for CT-A or carotid dopplers. Pt cannot get MRI due to pacemaker.  Chest pain - not sure. ? CHF related. Pt has asthma, but no clear wheezing. CXR is neg. She has CAD s/p CABg in 2016. We will get serial trops. EKG has no acute changes.   Final Clinical Impressions(s) / ED Diagnoses   Final diagnoses:  Numbness of face  Hypoxia  Acute on chronic systolic congestive heart failure [facility])    New Prescriptions New Prescriptions   No medications on file     Varney Biles, MD 03/05/16 [phone]

## 2016-03-04 NOTE — ED Notes (Signed)
Neurologist at bedside [facility] cancelled code stroke now further stroke work up necessary at this time pt remains in bed on monitor

## 2016-03-04 NOTE — Consult Note (Signed)
Neurology Consult Note  Reason for Consultation: CODE STROKE  Requesting provider: A. Kathrynn Humble, MD  CC: Weakness and numbness on the right side  HPI: This is a 71 year old right-handed woman who presents to the emergency department for evaluation of weakness and numbness on the right side. Pain directly from the patient was an excellent historian.  The patient reports that she was fine earlier today and was able to run errands without any difficulty. This afternoon, at approximately 3:00, she states that she noticed that she was having some weakness and pain in her right arm. She states the pain extended from the wrist only up into her shoulder. She then notices resent difficulty moving that arm. At the same time, she describes some tingling and numbness in the right face, arm, and leg. She states that she got up to try to walk and felt "swimmy headed." On further questioning, this sounds like she was actually lightheaded and she felt as if she would fall over. When she was standing, she felt like her right leg was not able to support her weight. She sat back down and states that her leg symptoms and lightheadedness resolved after about 20-30 minutes. She continued to have symptoms in her right arm which prompted her to visit the emergency department. In addition to her right-sided symptoms, she states that she's had a headache since last night. She states that she has sharp pains in the forehead region without any associated nausea, vomiting, photophobia, or phonophobia. She took some Tylenol with some reduction in pain to her headache has persisted.  Code stroke was activated in the ED. On my examination, she has prominent give way weakness in the right arm and leg with nonphysiologic sensory signs. As such, it was felt that this was not consistent with an ischemic stroke and the code stroke was canceled.  Last known well: 1500 NHISS score: 1 tPA given?: No, symptoms not consistent with  stroke   PMH:  Past Medical History:  Diagnosis Date  . Anemia   . Arthritis   . Asthma   . CAD (coronary artery disease)   . Cellulitis and abscess of foot 12/2014   RT FOOT  . CHF (congestive heart failure) ([place])   . Diabetes mellitus without complication ([place])   . Diverticulosis   . Fatty liver disease, nonalcoholic   . Gastritis   . GERD (gastroesophageal reflux disease)   . H/O hiatal hernia   . Hx of CABG   . Hyperplastic colon polyp   . Hypertension   . IBS (irritable bowel syndrome)   . ICD (implantable cardiac defibrillator) in place   . Internal hemorrhoids   . Ischemic cardiomyopathy   . Obesity   . OSA (obstructive sleep apnea)   . Shortness of breath     PSH:  Past Surgical History:  Procedure Laterality Date  . ABDOMINAL ULTRASOUND  12/01/2011   Peripelvic cysts- #1- 2.4x1.9x2.3cm, #2-1.2x0.9x1.2cm  . ANKLE SURGERY     right  . APPENDECTOMY    . BIV PACEMAKER GENERATOR CHANGE OUT N/A 11/02/2012   Procedure: BIV PACEMAKER GENERATOR CHANGE OUT;  Surgeon: Sanda Klein, MD;  Location: [place] CATH LAB;  Service: Cardiovascular;  Laterality: N/A;  . CARDIAC CATHETERIZATION  05/17/1999   No significant coronary obstructive disease w/ mild 20% luminal irregularity of the first diag branch of the LAD  . CARDIAC CATHETERIZATION  07/08/2002   No significant CAD, moderately depressed LV systolic function  . CARDIAC CATHETERIZATION Bilateral 04/26/2007   Normal  findings, recommend medical therapy  . CARDIAC CATHETERIZATION  02/18/2008   Moderate CAD, would benefit from having a functional study, recommend continue medical therapy  . CARDIAC CATHETERIZATION  07/23/2012   Medical therapy  . CARDIAC CATHETERIZATION N/A 11/24/2014   Procedure: Left Heart Cath and Coronary Angiography;  Surgeon: Troy Sine, MD;  Location: [place] CV LAB;  Service: Cardiovascular;  Laterality: N/A;  . CARDIAC CATHETERIZATION  11/27/2014   Procedure: Intravascular Pressure Wire/FFR  Study;  Surgeon: Peter M Martinique, MD;  Location: [place] CV LAB;  Service: Cardiovascular;;  . CARDIAC DEFIBRILLATOR PLACEMENT    . CERVICAL SPINE SURGERY     plate   . CORONARY ANGIOGRAM  2010  . CORONARY ARTERY BYPASS GRAFT N/A 11/29/2014   Procedure: CORONARY ARTERY BYPASS GRAFTING x 5 (LIMA-LAD, SVG-D, SVG-OM1-OM2, SVG-PD);  Surgeon: Melrose Nakayama, MD;  Location: [facility];  Service: Open Heart Surgery;  Laterality: N/A;  . KNEE SURGERY     right  . LEFT HEART CATHETERIZATION WITH CORONARY ANGIOGRAM N/A 07/23/2012   Procedure: LEFT HEART CATHETERIZATION WITH CORONARY ANGIOGRAM;  Surgeon: Leonie Man, MD;  Location: [facility] CATH LAB;  Service: Cardiovascular;  Laterality: N/A;  Carlton Adam MYOVIEW  11/14/2011   Mild-moderate defect seen in Mid Inferolateral and Mid Anterolateral regions-consistant w/ infarct/scar. No significant ischemia demonstrated.  [name] PACEMAKER INSERTION  2001   BS BivICD  . TEE WITHOUT CARDIOVERSION N/A 11/29/2014   Procedure: TRANSESOPHAGEAL ECHOCARDIOGRAM (TEE);  Surgeon: Melrose Nakayama, MD;  Location: [facility];  Service: Open Heart Surgery;  Laterality: N/A;  . TRANSTHORACIC ECHOCARDIOGRAM  07/23/2012   EF 55-60%, normal-mild  . TUBAL LIGATION      Family history: Family History  Problem Relation Age of Onset  . Breast cancer Mother   . Diabetes Mother   . Heart disease Maternal Grandmother   . Kidney disease Maternal Grandmother   . Diabetes Maternal Grandmother   . Glaucoma Maternal Aunt   . Heart disease Maternal Aunt   . Asthma Sister     Social history:  Social History   Social History  . Marital status: Widowed    Spouse name: N/A  . Number of children: 5  . Years of education: N/A   Occupational History  . 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 Smoking status: Former Smoker    Packs/day: 0.25    Years: 3.00    Types: Cigarettes    Quit date: 02/11/1968  . Smokeless tobacco: Never Used  . Alcohol  use No  . Drug use: No  . Sexual activity: No   Other Topics Concern  . Not on file   Social History Narrative   Widowed last year. She lives with her two sons.    Current inpatient meds:  No current facility-administered medications for this encounter.    Current Outpatient Prescriptions  Medication Sig Dispense Refill  . acetaminophen (TYLENOL) 500 MG tablet Take 1,000 mg by mouth every 6 (six) hours as needed (for pain.).    [name] albuterol (PROVENTIL HFA;VENTOLIN HFA) 108 (90 Base) MCG/ACT inhaler Inhale 2 puffs into the lungs every 4 (four) hours as needed for wheezing (wheezing). 6.7 g 3  . carvedilol (COREG) 6.25 MG tablet Take 1 tablet (6.25 mg total) by mouth 2 (two) times daily with a meal. 180 tablet 3  . cetirizine (ZYRTEC) 10 MG tablet Take 10 mg by mouth daily.     . clopidogrel (PLAVIX) 75 MG tablet Take  1 tablet (75 mg total) by mouth daily. 90 tablet 3  . clotrimazole (MYCELEX) 10 MG troche Take 10 mg by mouth every 4 (four) hours as needed (mouth pain).    [name] EPINEPHrine 0.3 mg/0.3 mL IJ SOAJ injection     . fluorometholone (FML) 0.1 % ophthalmic suspension Place 1 drop into both eyes 2 (two) times daily.    . fluticasone (FLONASE) 50 MCG/ACT nasal spray Place 1 spray into the nose 2 (two) times daily as needed for rhinitis or allergies.     . isosorbide mononitrate (IMDUR) 30 MG 24 hr tablet Take 1 tablet (30 mg total) by mouth daily. 30 tablet 0  . omeprazole (PRILOSEC) 40 MG capsule Take 1 capsule (40 mg total) by mouth 2 (two) times daily before a meal. 60 capsule 11  . potassium chloride SA (KLOR-CON M20) 20 MEQ tablet Take 1 tablet (20 mEq total) by mouth daily. 90 tablet 3  . rosuvastatin (CRESTOR) 20 MG tablet Take 1 tablet (20 mg total) by mouth daily. 90 tablet 3  . spironolactone (ALDACTONE) 25 MG tablet Take 1 tablet (25 mg total) by mouth daily. 90 tablet 3  . SYMBICORT 160-4.5 MCG/ACT inhaler Inhale 2 puffs into the lungs 2 (two) times daily.    [name] torsemide  (DEMADEX) 20 MG tablet Take 2 tablets (40 mg total) by mouth 2 (two) times daily. 180 tablet 3  . triamcinolone cream (KENALOG) 0.1 % Apply 1 application topically daily as needed (applied to affected itchy area(s)).       Allergies: Allergies  Allergen Reactions  . Ivp Dye [Iodinated Diagnostic Agents] Shortness Of Breath    No reaction to PO contrast with non-ionic dye.06-25-2014/rsm  . Shellfish-Derived Products Anaphylaxis  . Sulfa Antibiotics Shortness Of Breath  . Iodine Hives  . Atorvastatin Other (See Comments)    Pt states "causes bilateral leg pain/cramps."  . Cephalexin Itching and Rash    Rash & itch after Keflex  . Zithromax [Azithromycin] Rash    ROS: As per HPI. A full 14-point review of systems was performed and is otherwise Notable for some shortness of breath with exertion.[name]   PE:  BP 136/92 (BP Location: Right Arm)   Pulse 70   Temp 98.9 F (37.2 C) (Oral)   Resp 18   Ht 5\' 5"  (1.651 m)   Wt 108.4 kg (239 lb)   SpO2 95%   BMI 39.77 kg/m   General: WD obese Caucasian woman lying in ED gurney. She appears to be mildly anxious. AAO x4. Speech clear, no dysarthria. No aphasia. Follows commands briskly. Affect is bright with congruent mood. Comportment is normal.  HEENT: Normocephalic. Neck supple without LAD. MMM, OP clear. Dentition good. Sclerae anicteric. No conjunctival injection.  CV: Distant, regular, no obvious murmur. Carotid pulses full and symmetric, no bruits. Distal pulses 2+ and symmetric.  Lungs: CTAB.  Abdomen: Soft, obese, non-distended, non-tender. Bowel sounds present x4.  Extremities: No C/C/E. Neuro:  CN: Pupils are equal and round. They are symmetrically reactive from 3-->2 mm. visual fields are full to confrontation. EOMI without nystagmus. There is some breakup of smooth pursuit. No reported diplopia. Facial sensation is decreased to light touch and pinprick on the right side with midline splitting to pinprick. She also reports that she is  unable to feel vibration on the right side of her forehead with intact vibration on the left. Face is symmetric at rest with normal strength and mobility. Hearing is intact to conversational voice. Palate elevates  symmetrically and uvula is midline. Voice is normal in tone, pitch and quality. Bilateral SCM and trapezii are 5/5. Tongue is midline with normal bulk and mobility.  Motor: Normal bulk, tone, and strength. She has prominent give way in the right arm and leg and complains of pain in the right upper arm and shoulder during strength testing. No tremor or other abnormal movements. No drift.  Sensation: Decreased to light touch and pinprick on the right.  DTRs: 2+, symmetric with the exception of absent ankle jerks bilaterally. Toes downgoing bilaterally. No pathologic reflexes.  Coordination: Finger-to-nose and heel-to-shin are without dysmetria. Finger taps are normal in amplitude and speed, no decrement.   Labs:  Lab Results  Component Value Date   WBC 7.6 10/22/2015   HGB 15.0 03/04/2016   HCT 44.0 03/04/2016   PLT 156 10/22/2015   GLUCOSE 110 (H) 03/04/2016   CHOL 241 (H) 10/23/2015   TRIG 172 (H) 10/23/2015   HDL 40 (L) 10/23/2015   LDLCALC 167 (H) 10/23/2015   ALT 22 10/22/2015   AST 17 10/22/2015   NA 141 03/04/2016   K 3.8 03/04/2016   CL 102 03/04/2016   CREATININE 1.40 (H) 03/04/2016   BUN 21 (H) 03/04/2016   CO2 28 10/22/2015   TSH 3.677 02/21/2015   INR 1.30 11/29/2014   HGBA1C 6.5 (H) 10/23/2015    Imaging: No neuroimaging for review.   Other diagnostic studies:  Carotid Dopplers 10/24/15: Technically limited due to body habitus, depth of vessels, and patient anatomy. Unable to visualize RICA. Possible LICA stenosis 34-19%.   Assessment and Plan: 1. Right-sided weakness: She has nonphysiologic findings on her current examination with prominent give way weakness and pain. Her examination is not typical of weakness due to cerebral ischemia. At this point, I do not  think further workup for TIA or stroke is necessary. I suspect that this is more consistent with conversion. On speaking with the patient further, she endorses being under significant stress. She has had some financial concerns recently. She is also somewhat bothered by the fact that she is no longer able to work or go out like she used to because of her health problems. She says that she has been somewhat more stressed today than usual.   That said, she certainly has risk factors for stroke which would include obesity, coronary artery disease, CHF, diabetes, hypertension, ischemic cardiomyopathy, and obstructive sleep apnea. Recommend ongoing aggressive risk factor modification. Continue clopidogrel and rosuvastatin.  2. Right hemisensory loss: This is nonphysiologic with midline splitting and asymmetry of vibration over the forehead. Suspect likely conversion as noted above.  This was discussed at length with the patient. She verbalizes understanding. She was given Depakote ask any questions and these were addressed to her satisfaction.  I discussed my findings and impression with the ED physician, Dr. Kathrynn Humble. At this point, I have no additional recommendations and will sign off. The patient can follow up with her outpatient PCP as needed.

## 2016-03-04 NOTE — ED Notes (Signed)
ER MD to bedside code stroke called CT called pt brought to Rm #3

## 2016-03-04 NOTE — ED Triage Notes (Signed)
Negative stroke screen as per EMS pt states that she has had this before with previous heart surgery but today the tingling and numbness in the R hand

## 2016-03-05 ENCOUNTER — Observation Stay (HOSPITAL_COMMUNITY): Payer: Medicare HMO

## 2016-03-05 ENCOUNTER — Encounter (HOSPITAL_COMMUNITY): Payer: Self-pay | Admitting: General Practice

## 2016-03-05 ENCOUNTER — Ambulatory Visit (INDEPENDENT_AMBULATORY_CARE_PROVIDER_SITE_OTHER): Payer: Commercial Managed Care - HMO | Admitting: *Deleted

## 2016-03-05 DIAGNOSIS — I10 Essential (primary) hypertension: Secondary | ICD-10-CM

## 2016-03-05 DIAGNOSIS — N183 Chronic kidney disease, stage 3 unspecified: Secondary | ICD-10-CM

## 2016-03-05 DIAGNOSIS — I442 Atrioventricular block, complete: Secondary | ICD-10-CM | POA: Diagnosis not present

## 2016-03-05 DIAGNOSIS — I5032 Chronic diastolic (congestive) heart failure: Secondary | ICD-10-CM

## 2016-03-05 DIAGNOSIS — I428 Other cardiomyopathies: Secondary | ICD-10-CM

## 2016-03-05 DIAGNOSIS — N186 End stage renal disease: Secondary | ICD-10-CM

## 2016-03-05 DIAGNOSIS — R0602 Shortness of breath: Secondary | ICD-10-CM | POA: Diagnosis not present

## 2016-03-05 DIAGNOSIS — R071 Chest pain on breathing: Secondary | ICD-10-CM | POA: Diagnosis not present

## 2016-03-05 DIAGNOSIS — R079 Chest pain, unspecified: Secondary | ICD-10-CM

## 2016-03-05 DIAGNOSIS — N189 Chronic kidney disease, unspecified: Secondary | ICD-10-CM

## 2016-03-05 DIAGNOSIS — R200 Anesthesia of skin: Secondary | ICD-10-CM | POA: Diagnosis not present

## 2016-03-05 HISTORY — DX: Anesthesia of skin: R20.0

## 2016-03-05 LAB — TROPONIN I
Troponin I: <0.03 ng/mL (ref <0.03)
Troponin I: <0.03 ng/mL (ref <0.03)
Troponin I: <0.03 ng/mL (ref <0.03)

## 2016-03-05 LAB — D-DIMER, QUANTITATIVE: D-Dimer, Quant: 0.83 ug/mL-FEU — ABNORMAL HIGH (ref 0.00–0.50)

## 2016-03-05 LAB — GLUCOSE, CAPILLARY: Glucose-Capillary: 121 mg/dL — ABNORMAL HIGH (ref 65–99)

## 2016-03-05 MED ORDER — ONDANSETRON HCL 4 MG/2ML IJ SOLN: 4.0000 mg | Freq: Four times a day (QID) | INTRAMUSCULAR | Status: DC | PRN

## 2016-03-05 MED ORDER — ACETAMINOPHEN 325 MG PO TABS
650.0000 mg | ORAL_TABLET | ORAL | Status: DC | PRN
  Administered 2016-03-05: 650 mg via ORAL
  Filled 2016-03-05: qty 2

## 2016-03-05 MED ORDER — CLOPIDOGREL BISULFATE 75 MG PO TABS
75.0000 mg | ORAL_TABLET | Freq: Every day | ORAL | Status: DC
Start: 2016-03-05 — End: 2016-03-06
  Administered 2016-03-05 – 2016-03-06 (×2): 75 mg via ORAL
  Filled 2016-03-05 (×2): qty 1

## 2016-03-05 MED ORDER — CARVEDILOL 6.25 MG PO TABS
6.2500 mg | ORAL_TABLET | Freq: Two times a day (BID) | ORAL | Status: DC
  Administered 2016-03-05 – 2016-03-06 (×2): 6.25 mg via ORAL
  Filled 2016-03-05 (×2): qty 1

## 2016-03-05 MED ORDER — PANTOPRAZOLE SODIUM 40 MG PO TBEC
40.0000 mg | DELAYED_RELEASE_TABLET | Freq: Every day | ORAL | Status: DC
  Administered 2016-03-05 – 2016-03-06 (×2): 40 mg via ORAL
  Filled 2016-03-05 (×2): qty 1

## 2016-03-05 MED ORDER — ASPIRIN EC 81 MG PO TBEC
81.0000 mg | DELAYED_RELEASE_TABLET | Freq: Every day | ORAL | Status: DC
  Administered 2016-03-05 – 2016-03-06 (×2): 81 mg via ORAL
  Filled 2016-03-05 (×2): qty 1

## 2016-03-05 MED ORDER — IPRATROPIUM-ALBUTEROL 0.5-2.5 (3) MG/3ML IN SOLN
3.0000 mL | Freq: Two times a day (BID) | RESPIRATORY_TRACT | Status: DC
  Filled 2016-03-05 (×2): qty 3

## 2016-03-05 MED ORDER — MORPHINE SULFATE (PF) 4 MG/ML IV SOLN: 2.0000 mg | INTRAVENOUS | Status: DC | PRN

## 2016-03-05 MED ORDER — IPRATROPIUM BROMIDE 0.02 % IN SOLN
0.5000 mg | Freq: Once | RESPIRATORY_TRACT | Status: AC
  Administered 2016-03-05: 0.5 mg via RESPIRATORY_TRACT
  Filled 2016-03-05: qty 2.5

## 2016-03-05 MED ORDER — FLUOROMETHOLONE 0.1 % OP SUSP
1.0000 [drp] | Freq: Two times a day (BID) | OPHTHALMIC | Status: DC
  Administered 2016-03-05 – 2016-03-06 (×2): 1 [drp] via OPHTHALMIC
  Filled 2016-03-05 (×2): qty 5

## 2016-03-05 MED ORDER — ROSUVASTATIN CALCIUM 20 MG PO TABS
20.0000 mg | ORAL_TABLET | Freq: Every day | ORAL | Status: DC
  Administered 2016-03-05: 20 mg via ORAL
  Filled 2016-03-05 (×2): qty 1

## 2016-03-05 MED ORDER — TECHNETIUM TC 99M DIETHYLENETRIAME-PENTAACETIC ACID
31.5000 | Freq: Once | INTRAVENOUS | Status: DC | PRN
Start: 2016-03-05 — End: 2016-03-06

## 2016-03-05 MED ORDER — ACETAMINOPHEN 160 MG/5ML PO SOLN: 650.0000 mg | ORAL | Status: DC | PRN

## 2016-03-05 MED ORDER — POTASSIUM CHLORIDE CRYS ER 20 MEQ PO TBCR
20.0000 meq | EXTENDED_RELEASE_TABLET | Freq: Every day | ORAL | Status: DC
  Administered 2016-03-05 – 2016-03-06 (×2): 20 meq via ORAL
  Filled 2016-03-05 (×3): qty 1

## 2016-03-05 MED ORDER — COLCHICINE 0.6 MG PO TABS
0.6000 mg | ORAL_TABLET | Freq: Two times a day (BID) | ORAL | Status: DC
  Administered 2016-03-05 – 2016-03-06 (×3): 0.6 mg via ORAL
  Filled 2016-03-05 (×3): qty 1

## 2016-03-05 MED ORDER — ACETAMINOPHEN 650 MG RE SUPP: 650.0000 mg | RECTAL | Status: DC | PRN

## 2016-03-05 MED ORDER — TORSEMIDE 20 MG PO TABS
40.0000 mg | ORAL_TABLET | Freq: Two times a day (BID) | ORAL | Status: DC
  Administered 2016-03-05 – 2016-03-06 (×2): 40 mg via ORAL
  Filled 2016-03-05 (×2): qty 2

## 2016-03-05 MED ORDER — TECHNETIUM TO 99M ALBUMIN AGGREGATED
4.1000 | Freq: Once | INTRAVENOUS | Status: AC | PRN
  Administered 2016-03-05: 4.1 via INTRAVENOUS

## 2016-03-05 MED ORDER — IPRATROPIUM-ALBUTEROL 0.5-2.5 (3) MG/3ML IN SOLN
3.0000 mL | Freq: Four times a day (QID) | RESPIRATORY_TRACT | Status: DC
  Administered 2016-03-05 (×2): 3 mL via RESPIRATORY_TRACT
  Filled 2016-03-05 (×3): qty 3

## 2016-03-05 MED ORDER — SPIRONOLACTONE 25 MG PO TABS
25.0000 mg | ORAL_TABLET | Freq: Every day | ORAL | Status: DC
  Administered 2016-03-05 – 2016-03-06 (×2): 25 mg via ORAL
  Filled 2016-03-05 (×2): qty 1

## 2016-03-05 MED ORDER — ALBUTEROL SULFATE (2.5 MG/3ML) 0.083% IN NEBU
5.0000 mg | INHALATION_SOLUTION | Freq: Once | RESPIRATORY_TRACT | Status: AC
  Administered 2016-03-05: 5 mg via RESPIRATORY_TRACT
  Filled 2016-03-05: qty 6

## 2016-03-05 MED ORDER — NITROGLYCERIN 0.4 MG SL SUBL: 0.4000 mg | SUBLINGUAL_TABLET | SUBLINGUAL | Status: DC | PRN

## 2016-03-05 MED ORDER — ISOSORBIDE MONONITRATE ER 30 MG PO TB24
30.0000 mg | ORAL_TABLET | Freq: Every day | ORAL | Status: DC
  Administered 2016-03-05 – 2016-03-06 (×2): 30 mg via ORAL
  Filled 2016-03-05 (×2): qty 1

## 2016-03-05 NOTE — ED Notes (Signed)
Pt up to bathroom.

## 2016-03-05 NOTE — ED Notes (Signed)
Pt c/o continued chest pain and short of breath. Dr. Letta Median contacted with same

## 2016-03-05 NOTE — ED Notes (Signed)
Joey Deakin from [facility] nothing showing and nothing to change. Will fax report

## 2016-03-05 NOTE — ED Notes (Signed)
Dr. Letta Median at bedside aznd awarte that pt staes resp treatment has not improved [facility] that she is feeling.

## 2016-03-05 NOTE — Progress Notes (Signed)
Notified Schorr, NP that pt's CT of head (-). Pt's rt face numbness has resolved. NP stated to do one more neuro check and VS between 11p-7a shift and would let MD in the morning address neuro checks and VS frequency. Will continue to monitor pt. Ranelle Oyster, RN

## 2016-03-05 NOTE — ED Notes (Signed)
Attempted to call report

## 2016-03-05 NOTE — H&P (Signed)
History and Physical    [name] VQQ:[id number] DOB: 07/19/1945 DOA: 03/04/2016  PCP: [name], MD   Patient coming from: Home  Chief Complaint: Right face, arm and leg numbness with weakness  HPI: [name] is a 71 y.o. woman with a history of CAD S/P CABG, ischemic cardiomyopathy S/P Bi-V ICD/PPM implant, prior TIA, HTN, DM, and CKD 3 who presents to the ED via EMS for evaluation of right sided face, arm, and leg numbness and weakness.  Symptoms started around 3PM.  She presented with similar symptoms on the LEFT in September 2017.  She was diagnosed with a TIA at that time.  She could not have an MRI because of her ICD, and she did not have CT with contrast because of an iodine allergy (anaphylaxis).  She saw neurology (Dr. Rosalin Hawking) for follow up in November.  She tells me that she was advised to report to the ED for urgent evaluation if similar symptoms ever returned.  Dr. [name] also documented that he would consider CTA of the head and neck if she had recurrent symptoms.  The patient is also complaining of chest pain.  It is substernal and radiating to her left side and into her back.  It has been associated with light-headedness and shortness of breath, but no syncope.    ED Course: Neurology on-call was consulted by the ED.  Head CT negative for acute process.  Dr. Shon Hale does not feel like the patient is having a TIA and no further evaluation is recommended at this time.  EKG shows a paced rhythm.  First troponin negative.    Hospitalist asked to place in observation with telemetry monitoring.  Of note, there was concern about intermittent episodes of atrial fibrillation detected with ICD interrogation when she presented with TIA in September.  This was acknowledged by cardiology but it appears that ongoing surveillance was recommended.  The patient has not been committed to full anticoagulation at this time.  She also reports that she has not had a device  interrogation in six weeks.  Interrogation requested at the time of admission.  Review of Systems: As per HPI otherwise 10 point review of systems negative.    Past Medical History:  Diagnosis Date  . Anemia   . Arthritis   . Asthma   . CAD (coronary artery disease)   . Cellulitis and abscess of foot 12/2014   RT FOOT  . CHF (congestive heart failure) ([place])   . Diabetes mellitus without complication ([place])   . Diverticulosis   . Fatty liver disease, nonalcoholic   . Gastritis   . GERD (gastroesophageal reflux disease)   . H/O hiatal hernia   . Hx of CABG   . Hyperplastic colon polyp   . Hypertension   . IBS (irritable bowel syndrome)   . ICD (implantable cardiac defibrillator) in place   . Internal hemorrhoids   . Ischemic cardiomyopathy   . Obesity   . OSA (obstructive sleep apnea)   . Shortness of breath     Past Surgical History:  Procedure Laterality Date  . ABDOMINAL ULTRASOUND  12/01/2011   Peripelvic cysts- #1- 2.4x1.9x2.3cm, #2-1.2x0.9x1.2cm  . ANKLE SURGERY     right  . APPENDECTOMY    . BIV PACEMAKER GENERATOR CHANGE OUT N/A 11/02/2012   Procedure: BIV PACEMAKER GENERATOR CHANGE OUT;  Surgeon: Sanda Klein, MD;  Location: [facility] C[facility];  Service: Cardiovascular;  Laterality: N/A;  . CARDIAC CATHETERIZATION  05/17/1999   No  significant coronary obstructive disease w/ mild 20% luminal irregularity of the first diag branch of the LAD  . CARDIAC CATHETERIZATION  07/08/2002   No significant CAD, moderately depressed LV systolic function  . CARDIAC CATHETERIZATION Bilateral 04/26/2007   Normal findings, recommend medical therapy  . CARDIAC CATHETERIZATION  02/18/2008   Moderate CAD, would benefit from having a functional study, recommend continue medical therapy  . CARDIAC CATHETERIZATION  07/23/2012   Medical therapy  . CARDIAC CATHETERIZATION N/A 11/24/2014   Procedure: Left Heart Cath and Coronary Angiography;  Surgeon: Troy Sine, MD;  Location: [place] [facility]  [facility];  Service: Cardiovascular;  Laterality: N/A;  . CARDIAC CATHETERIZATION  11/27/2014   Procedure: Intravascular Pressure Wire/FFR Study;  Surgeon: Peter M Martinique, MD;  Location: [facility];  Service: Cardiovascular;;  . CARDIAC DEFIBRILLATOR PLACEMENT    . CERVICAL SPINE SURGERY     plate   . CORONARY ANGIOGRAM  2010  . CORONARY ARTERY BYPASS GRAFT N/A 11/29/2014   Procedure: CORONARY ARTERY BYPASS GRAFTING x 5 (LIMA-LAD, SVG-D, SVG-OM1-OM2, SVG-PD);  Surgeon: Melrose Nakayama, MD;  Location: [facility];  Service: Open Heart Surgery;  Laterality: N/A;  . KNEE SURGERY     right  . LEFT HEART CATHETERIZATION WITH CORONARY ANGIOGRAM N/A 07/23/2012   Procedure: LEFT HEART CATHETERIZATION WITH CORONARY ANGIOGRAM;  Surgeon: Leonie Man, MD;  Location: [facility];  Service: Cardiovascular;  Laterality: N/A;  Carlton Adam MYOVIEW  11/14/2011   Mild-moderate defect seen in Mid Inferolateral and Mid Anterolateral regions-consistant w/ infarct/scar. No significant ischemia demonstrated.  [name] PACEMAKER INSERTION  2001   BS BivICD  . TEE WITHOUT CARDIOVERSION N/A 11/29/2014   Procedure: TRANSESOPHAGEAL ECHOCARDIOGRAM (TEE);  Surgeon: Melrose Nakayama, MD;  Location: [place];  Service: Open Heart Surgery;  Laterality: N/A;  . TRANSTHORACIC ECHOCARDIOGRAM  07/23/2012   EF 55-60%, normal-mild  . TUBAL LIGATION       reports that she quit smoking about 48 years ago. Her smoking use included Cigarettes. She has a 0.75 pack-year smoking history. She has never used smokeless tobacco. She reports that she does not drink alcohol or use drugs.  Allergies  Allergen Reactions  . Ivp Dye [Iodinated Diagnostic Agents] Shortness Of Breath    No reaction to PO contrast with non-ionic dye.06-25-2014/rsm  . Shellfish-Derived Products Anaphylaxis  . Sulfa Antibiotics Shortness Of Breath  . Uloric [Febuxostat] Nausea And Vomiting  . Iodine Hives  . Atorvastatin Other (See Comments)    Pt states "causes  bilateral leg pain/cramps."  . Cephalexin Itching and Rash    Rash & itch after Keflex  . Zithromax [Azithromycin] Rash    Family History  Problem Relation Age of Onset  . Breast cancer Mother   . Diabetes Mother   . Heart disease Maternal Grandmother   . Kidney disease Maternal Grandmother   . Diabetes Maternal Grandmother   . Glaucoma Maternal Aunt   . Heart disease Maternal Aunt   . Asthma Sister      Prior to Admission medications   Medication Sig Start Date End Date Taking? Authorizing Provider  acetaminophen (TYLENOL) 500 MG tablet Take 1,000 mg by mouth every 6 (six) hours as needed (for pain.).   Yes Historical Provider, MD  albuterol (PROVENTIL HFA;VENTOLIN HFA) 108 (90 Base) MCG/ACT inhaler Inhale 2 puffs into the lungs every 4 (four) hours as needed for wheezing (wheezing). 02/26/16  Yes Rigoberto Noel, MD  carvedilol (COREG) 6.25 MG tablet Take 1 tablet (  6.25 mg total) by mouth 2 (two) times daily with a meal. 12/07/15  Yes Almyra Deforest, PA  cetirizine (ZYRTEC) 10 MG tablet Take 10 mg by mouth daily.    Yes Historical Provider, MD  clopidogrel (PLAVIX) 75 MG tablet Take 1 tablet (75 mg total) by mouth daily. 12/07/15  Yes Almyra Deforest, PA  clotrimazole (MYCELEX) 10 MG troche Take 10 mg by mouth every 4 (four) hours as needed (mouth pain).   Yes Historical Provider, MD  colchicine 0.6 MG tablet Take 0.6 mg by mouth 2 (two) times daily.   Yes Historical Provider, MD  EPINEPHrine 0.3 mg/0.3 mL IJ SOAJ injection Inject 0.3 mg into the muscle daily as needed (allergic reaction).  10/17/15  Yes Historical Provider, MD  fluorometholone (FML) 0.1 % ophthalmic suspension Place 1 drop into both eyes 2 (two) times daily. 08/22/15  Yes Historical Provider, MD  fluticasone (FLONASE) 50 MCG/ACT nasal spray Place 1 spray into the nose 2 (two) times daily as needed for rhinitis or allergies.    Yes Historical Provider, MD  isosorbide mononitrate (IMDUR) 30 MG 24 hr tablet Take 1 tablet (30 mg total) by  mouth daily. 10/19/15 10/13/16 Yes [name], MD  omeprazole (PRILOSEC) 40 MG capsule Take 1 capsule (40 mg total) by mouth 2 (two) times daily before a meal. 07/12/15  Yes Kavitha Nandigam V, MD  potassium chloride SA (KLOR-CON M20) 20 MEQ tablet Take 1 tablet (20 mEq total) by mouth daily. 12/07/15  Yes Almyra Deforest, PA  rosuvastatin (CRESTOR) 20 MG tablet Take 1 tablet (20 mg total) by mouth daily. 12/28/15 03/27/16 Yes [name], MD  spironolactone (ALDACTONE) 25 MG tablet Take 1 tablet (25 mg total) by mouth daily. 12/28/15  Yes [name], MD  SYMBICORT 160-4.5 MCG/ACT inhaler Inhale 2 puffs into the lungs 2 (two) times daily. 12/02/13  Yes Historical Provider, MD  torsemide (DEMADEX) 20 MG tablet Take 2 tablets (40 mg total) by mouth 2 (two) times daily. 12/07/15  Yes Almyra Deforest, PA  triamcinolone cream (KENALOG) 0.1 % Apply 1 application topically daily as needed (applied to affected itchy area(s)).  08/24/14  Yes Historical Provider, MD    Physical Exam: Vitals:   03/04/16 2300 03/04/16 2315 03/05/16 0000 03/05/16 0100  BP: 160/78 159/75 139/71 131/94  Pulse: 69 67 67 65  Resp: 18 25 17 19   Temp:      TempSrc:      SpO2: 93% 91% 92% 96%  Weight:      Height:          Constitutional: NAD, calm, comfortable, chronically ill appearing but nontoxic. Vitals:   03/04/16 2300 03/04/16 2315 03/05/16 0000 03/05/16 0100  BP: 160/78 159/75 139/71 131/94  Pulse: 69 67 67 65  Resp: 18 25 17 19   Temp:      TempSrc:      SpO2: 93% 91% 92% 96%  Weight:      Height:       Eyes: PERRL, lids and conjunctivae normal ENMT: Mucous membranes are moist. Posterior pharynx clear of any exudate or lesions. Normal dentition.  Neck: normal appearance, supple Respiratory: clear to auscultation bilaterally, no wheezing, no crackles. Normal respiratory effort. No accessory muscle use.  Cardiovascular: Normal rate, regular rhythm, no murmurs / rubs / gallops. No extremity edema. 2+ pedal  pulses. GI: abdomen is soft and compressible.  No distention.  No tenderness.  Bowel sounds are present. Musculoskeletal:  No joint deformity in upper and lower extremities. Good ROM,  no contractures. Normal muscle tone.  Skin: Rash to right side of her face, warm and dry Neurologic: CN 2-12 grossly intact. She reports numbness/decreasd sensation in her right hand and on the right side of her face.  Strength symmetric in bilateral upper extremities to me.  She has mild right leg weakness.    Psychiatric: Normal judgment and insight. Alert and oriented x 3. Normal mood.     Labs on Admission: I have personally reviewed following labs and imaging studies  CBC:  Recent Labs Lab 03/04/16 1824 03/04/16 1831  WBC 5.9  --   NEUTROABS 3.3  --   HGB 14.2 15.0  HCT 44.5 44.0  MCV 89.9  --   PLT 157  --    Basic Metabolic Panel:  Recent Labs Lab 03/04/16 1824 03/04/16 1831  NA 141 141  K 3.9 3.8  CL 104 102  CO2 30  --   GLUCOSE 110* 110*  BUN 17 21*  CREATININE 1.28* 1.40*  CALCIUM 9.3  --    GFR: Estimated Creatinine Clearance: 45.2 mL/min (by C-G formula based on SCr of 1.4 mg/dL (H)). Liver Function Tests:  Recent Labs Lab 03/04/16 1824  AST 25  ALT 30  ALKPHOS 118  BILITOT 0.6  PROT 6.3*  ALBUMIN 3.5   Coagulation Profile:  Recent Labs Lab 03/04/16 1824  INR 0.94    Radiological Exams on Admission: Dg Chest Port 1 View  Result Date: 03/04/2016 CLINICAL DATA:  Right arm weakness. Coronary artery disease. Pacemaker. EXAM: PORTABLE CHEST 1 VIEW COMPARISON:  10/23/2015 FINDINGS: Heart size and mediastinal contours are stable. Aortic atherosclerosis. Transvenous pacemaker is unchanged in position. Prior CABG. Both lungs are clear. No evidence of pneumothorax or pleural effusion. IMPRESSION: Stable exam.  No active disease. Electronically Signed   By: Earle Gell M.D.   On: 03/04/2016 21:44   Ct Head Code Stroke Wo Contrast`  Result Date: 03/04/2016 CLINICAL  DATA:  Code stroke. RIGHT-sided weakness. Last seen normal at 1500 hours. Assess stroke. History of hypertension, diabetes, TIA. EXAM: CT HEAD WITHOUT CONTRAST TECHNIQUE: Contiguous axial images were obtained from the base of the skull through the vertex without intravenous contrast. COMPARISON:  CT HEAD October 24, 2015 FINDINGS: BRAIN: The ventricles and sulci are normal for age. No intraparenchymal hemorrhage, mass effect nor midline shift. Patchy supratentorial white matter hypodensities within normal range for patient's age, though non-specific are most compatible with chronic small vessel ischemic disease. Old LEFT caudate lacunar infarct. No acute large vascular territory infarcts. No abnormal extra-axial fluid collections. Basal cisterns are patent. VASCULAR: Moderate calcific atherosclerosis of the carotid siphons. SKULL: No skull fracture. No significant scalp soft tissue swelling. SINUSES/ORBITS: The mastoid air-cells and included paranasal sinuses are well-aerated. Status post bilateral ocular lens implants. The included ocular globes and orbital contents are non-suspicious. OTHER: Patient is edentulous. ASPECTS [facility] Stroke Program Early CT Score) - Ganglionic level infarction (caudate, lentiform nuclei, internal capsule, insula, M1-M3 cortex): 7 - Supraganglionic infarction (M4-M6 cortex): 3 Total score (0-10 with 10 being normal): 10 IMPRESSION: 1. No acute intracranial process. Old LEFT basal ganglia lacunar infarct and mild to moderate chronic small vessel ischemic disease. 2. ASPECTS is 10. Dr. Shon Hale paged March 04, 2016 at 18:32hours, pending return call. Electronically Signed   By: Elon Alas M.D.   On: 03/04/2016 18:33    EKG: Independently reviewed. Paced.  Assessment/Plan Principal Problem:   Right facial numbness Active Problems:   Essential hypertension   Coronary atherosclerosis  Morbid obesity due to excess calories (HCC)   Cardiomyopathy, nonischemic, Bi V  responder,  EF 65-70% echo Oct 2015   S/P CABG x 5   Chronic diastolic heart failure (HCC)   Chest pain   CKD (chronic kidney disease), stage III      Recurrent numbness and weakness, now affecting the right side.  The patient has been evaluated by neurology tonight, who does not feel that her symptoms are consistent with TIA/CVA.  She is high risk for contrast exposure given her iodine allergy and CKD, so I do not feel that ordering CTA of the head and neck tonight is the most appropriate next step.  Patient has voiced an understanding and agreement.  I agree with monitoring her on telemetry and screening for resolution of symptoms.  I think her case should be discussed with Dr. [name] in the AM.  Also, it is important to know whether or not she is having intermittent a fib, because this condition would influence her overall stroke risk and potentially justify adjusting her medical management to include full anticoagulation.  For now will give baby aspirin plus plavix, and continue statin. --Device interrogation pending. --Further neuro imaging deferred at this time --Neurochecks per protocol --Fall precautions  Chest pain, recurrent.  I think she has chronic stable angina. --Telemetry monitoring --Serial troponin --Recent echo with last TIA presentation --Recent cardiology follow-up --Continue Coreg, Imdur.  ? Consider ranexa?  Compensated CHF --Continue home doses of Torsemide, spironolactone for now   DVT prophylaxis: SCDs Code Status: FULL Family Communication: Patient alone in the ED at time of admission. Disposition Plan: Expect she will go home at discharge. Consults called: Neurology Admission status: Place in observation with telemetry monitoring   TIME SPENT: 70 minutes   Eber Jones MD [facility] Pager [phone]  If 7PM-7AM, please contact night-coverage [email or web address] Password [email or web address]  03/05/2016, 2:40 AM

## 2016-03-05 NOTE — Progress Notes (Signed)
Remote pacemaker transmission.   

## 2016-03-05 NOTE — Progress Notes (Signed)
Asians and examined this morning, admitted overnight last night by Dr. Eulas Post. H&P reviewed, agree with the assessment and plan. In brief, this is a 71 year old female with history of coronary artery disease status post CABG, ischemic cardiomyopathy status post viV ICD and PPM placement, was admitted for right-sided arm and face and leg numbness and weakness. Neurology was consulted and advised patient the ER, did not feel like her right-sided weakness needs further workup per Dr. Shon Hale. Her weakness on the right side has resolved this morning.  She is complaining to me about intermittent chest pain for "sometime", however worsened the last 1 week, occasionally worse with exertion but can appear distressed as well. She's also been more dyspneic with exertion in the last week. I consulted cardiology, appreciate input. Initial troponin was negative. I will also obtain a d-dimer.   Maylynn Orzechowski M. Cruzita Lederer, MD [facility] [phone]

## 2016-03-05 NOTE — ED Notes (Signed)
Hospitalist at bedside 

## 2016-03-05 NOTE — ED Notes (Signed)
Pt states that she does not feel improved after neb treatment.

## 2016-03-05 NOTE — ED Notes (Signed)
Cardiologist at bedside.  

## 2016-03-05 NOTE — ED Notes (Signed)
Pt pacer interrogated and data sent.

## 2016-03-05 NOTE — Consult Note (Addendum)
Cardiology Consult    Patient ID: [name] MRN: [id number], DOB/AGE: 16-Apr-1945   Admit date: 03/04/2016 Date of Consult: 03/05/2016  Primary Physician: [name], MD Reason for Consult: Chest pain Primary Cardiologist: Dr. [name] Requesting Provider: Dr Cruzita Lederer   History of Present Illness    [name] is a 71 y.o.  woman with a history of CAD S/P CABG 11/2014, ischemic cardiomyopathy S/P Bi-V ICD/PPM implant [facility]), diastolic heart failure, asthma, morbid obesity, adrenal insufficiency, DM2, prior TIA, HTN, OSA and CKD stage 3 who presents to the ED via EMS for evaluation of right sided face, arm, and leg numbness and weakness. Symptoms started around 3PM.  She presented with similar symptoms on the LEFT in September 2017.  She was diagnosed with a TIA at that time.  She could not have an MRI because of her ICD, and she did not have CT with contrast because of an iodine allergy (anaphylaxis).  She saw neurology (Dr. Rosalin Hawking) for follow up in November who would consider a CT of the head/neck if she had recurrent symptoms.  We have been asked to consult for chest pain and assessment for possible atrial fibrillation. The patient has ongoing intermittent chest pain worse with lying on her left side since her CABG in 11/2014. She has chronic DOE and orthopnea, also has asthma and OSA. Her breathing is not worse than her usual. She has some congestion and light increase in her usual cough. Interrogations of her pacemaker has not found definitive atrial fib/flutter and anticoagulation was not recommended.  Her first troponin is negative. EKG shows paced rhythm, no changes. Her BNP is mildly elevated at 173.2. Her pacemaker/ICD was interrogated with no evidence of atrial fibrillation. Will have EP team look at readings. CXR shows clear lungs with no active disease.  Most recent echo 11/03/2015 showed EF 55% to 60%. Wall motion was normal; there were no  regional wall motion abnormalities. Doppler parameters are consistent with abnormal left ventricular relaxation (grade 1 diastolic dysfunction).   Past Medical History   Past Medical History:  Diagnosis Date  . Anemia   . Arthritis   . Asthma   . CAD (coronary artery disease)   . Cellulitis and abscess of foot 12/2014   RT FOOT  . CHF (congestive heart failure) ([place])   . Diabetes mellitus without complication ([place])   . Diverticulosis   . Fatty liver disease, nonalcoholic   . Gastritis   . GERD (gastroesophageal reflux disease)   . H/O hiatal hernia   . Hx of CABG   . Hyperplastic colon polyp   . Hypertension   . IBS (irritable bowel syndrome)   . ICD (implantable cardiac defibrillator) in place   . Internal hemorrhoids   . Ischemic cardiomyopathy   . Obesity   . OSA (obstructive sleep apnea)   . Shortness of breath     Past Surgical History:  Procedure Laterality Date  . ABDOMINAL ULTRASOUND  12/01/2011   Peripelvic cysts- #1- 2.4x1.9x2.3cm, #2-1.2x0.9x1.2cm  . ANKLE SURGERY     right  . APPENDECTOMY    . BIV PACEMAKER GENERATOR CHANGE OUT N/A 11/02/2012   Procedure: BIV PACEMAKER GENERATOR CHANGE OUT;  Surgeon: Sanda Klein, MD;  Location: [place] CATH LAB;  Service: Cardiovascular;  Laterality: N/A;  . CARDIAC CATHETERIZATION  05/17/1999   No significant coronary obstructive disease w/ mild 20% luminal irregularity of the first diag branch of the LAD  . CARDIAC CATHETERIZATION  07/08/2002  No significant CAD, moderately depressed LV systolic function  . CARDIAC CATHETERIZATION Bilateral 04/26/2007   Normal findings, recommend medical therapy  . CARDIAC CATHETERIZATION  02/18/2008   Moderate CAD, would benefit from having a functional study, recommend continue medical therapy  . CARDIAC CATHETERIZATION  07/23/2012   Medical therapy  . CARDIAC CATHETERIZATION N/A 11/24/2014   Procedure: Left Heart Cath and Coronary Angiography;  Surgeon: Troy Sine, MD;  Location: [place] CV LAB;  Service: Cardiovascular;  Laterality: N/A;  . CARDIAC CATHETERIZATION  11/27/2014   Procedure: Intravascular Pressure Wire/FFR Study;  Surgeon: Ahmere Hemenway M Martinique, MD;  Location: [place] CV LAB;  Service: Cardiovascular;;  . CARDIAC DEFIBRILLATOR PLACEMENT    . CERVICAL SPINE SURGERY     plate   . CORONARY ANGIOGRAM  2010  . CORONARY ARTERY BYPASS GRAFT N/A 11/29/2014   Procedure: CORONARY ARTERY BYPASS GRAFTING x 5 (LIMA-LAD, SVG-D, SVG-OM1-OM2, SVG-PD);  Surgeon: Melrose Nakayama, MD;  Location: [place];  Service: Open Heart Surgery;  Laterality: N/A;  . KNEE SURGERY     right  . LEFT HEART CATHETERIZATION WITH CORONARY ANGIOGRAM N/A 07/23/2012   Procedure: LEFT HEART CATHETERIZATION WITH CORONARY ANGIOGRAM;  Surgeon: Leonie Man, MD;  Location[facility];  Service: Cardiovascular;  Laterality: N/A;  Carlton Adam MYOVIEW  11/14/2011   Mild-moderate defect seen in Mid Inferolateral and Mid Anterolateral regions-consistant w/ infarct/scar. No significant ischemia demonstrated.  [name] PACEMAKER INSERTION  2001   BS BivICD  . TEE WITHOUT CARDIOVERSION N/A 11/29/2014   Procedure: TRANSESOPHAGEAL ECHOCARDIOGRAM (TEE);  Surgeon: Melrose Nakayama, MD;  Location: [place];  Service: Open Heart Surgery;  Laterality: N/A;  . TRANSTHORACIC ECHOCARDIOGRAM  07/23/2012   EF 55-60%, normal-mild  . TUBAL LIGATION       Allergies  Allergies  Allergen Reactions  . Ivp Dye [Iodinated Diagnostic Agents] Shortness Of Breath    No reaction to PO contrast with non-ionic dye.06-25-2014/rsm  . Shellfish-Derived Products Anaphylaxis  . Sulfa Antibiotics Shortness Of Breath  . Uloric [Febuxostat] Nausea And Vomiting  . Iodine Hives  . Atorvastatin Other (See Comments)    Pt states "causes bilateral leg pain/cramps."  . Cephalexin Itching and Rash    Rash & itch after Keflex  . Zithromax [Azithromycin] Rash    Inpatient Medications    . ipratropium-albuterol  3 mL Nebulization Q6H     Family History    Family History  Problem Relation Age of Onset  . Breast cancer Mother   . Diabetes Mother   . Heart disease Maternal Grandmother   . Kidney disease Maternal Grandmother   . Diabetes Maternal Grandmother   . Glaucoma Maternal Aunt   . Heart disease Maternal Aunt   . Asthma Sister     Social History    Social History   Social History  . Marital status: Widowed    Spouse name: N/A  . Number of children: 5  . Years of education: N/A   Occupational History  . 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 Smoking status: Former Smoker    Packs/day: 0.25    Years: 3.00    Types: Cigarettes    Quit date: 02/11/1968  . Smokeless tobacco: Never Used  . Alcohol use No  . Drug use: No  . Sexual activity: No   Other Topics Concern  . Not on file   Social History Narrative   Widowed last year. She lives with her two sons.  Review of Systems   General:  No chills, fever, night sweats or weight changes.  Cardiovascular:  Chronic chest discomfort with movement and dyspnea on exertion, mild edema, chronic orthopnea, Negative for palpitations, paroxysmal nocturnal dyspnea. Dermatological: No rash, lesions/masses Respiratory: Occ cough  Urologic: No hematuria, dysuria Abdominal:   No nausea, vomiting, diarrhea, bright red blood per rectum, melena, or hematemesis Neurologic:  No visual changes, wkns, changes in mental status. All other systems reviewed and are otherwise negative except as noted above.  Physical Exam    Blood pressure 163/80, pulse 68, temperature 98.9 F (37.2 C), temperature source Oral, resp. rate 14, height 5\' 5"  (1.651 m), weight 239 lb (108.4 kg), SpO2 93 %.  General: Pleasant, obese caucasian female, NAD Psych: Normal affect. Neuro: Alert and oriented X 3. Moves all extremities spontaneously. HEENT: Normal  Neck: Supple without bruits or JVD. Lungs:  Resp regular and unlabored, CTA. Heart: RRR no s3,  s4, or murmurs. Abdomen: Soft, non-tender, rounded, BS + x 4.  Extremities: No clubbing or cyanosis. DP/PT/Radials 1+ and equal bilaterally. trace ankle edema  Labs    Troponin [facility] Test)  Recent Labs  03/04/16 2211  TROPIPOC 0.00    Recent Labs  03/05/16 0730  TROPONINI <0.03   Lab Results  Component Value Date   WBC 5.9 03/04/2016   HGB 15.0 03/04/2016   HCT 44.0 03/04/2016   MCV 89.9 03/04/2016   PLT 157 03/04/2016    Recent Labs Lab 03/04/16 1824 03/04/16 1831  NA 141 141  K 3.9 3.8  CL 104 102  CO2 30  --   BUN 17 21*  CREATININE 1.28* 1.40*  CALCIUM 9.3  --   PROT 6.3*  --   BILITOT 0.6  --   ALKPHOS 118  --   ALT 30  --   AST 25  --   GLUCOSE 110* 110*   Lab Results  Component Value Date   CHOL 241 (H) 10/23/2015   HDL 40 (L) 10/23/2015   LDLCALC 167 (H) 10/23/2015   TRIG 172 (H) 10/23/2015   Lab Results  Component Value Date   DDIMER 1.56 (H) 10/23/2015     Radiology Studies    Dg Chest Port 1 View  Result Date: 03/04/2016 CLINICAL DATA:  Right arm weakness. Coronary artery disease. Pacemaker. EXAM: PORTABLE CHEST 1 VIEW COMPARISON:  10/23/2015 FINDINGS: Heart size and mediastinal contours are stable. Aortic atherosclerosis. Transvenous pacemaker is unchanged in position. Prior CABG. Both lungs are clear. No evidence of pneumothorax or pleural effusion. IMPRESSION: Stable exam.  No active disease. Electronically Signed   By: Earle Gell M.D.   On: 03/04/2016 21:44   Ct Head Code Stroke Wo Contrast`  Result Date: 03/04/2016 CLINICAL DATA:  Code stroke. RIGHT-sided weakness. Last seen normal at 1500 hours. Assess stroke. History of hypertension, diabetes, TIA. EXAM: CT HEAD WITHOUT CONTRAST TECHNIQUE: Contiguous axial images were obtained from the base of the skull through the vertex without intravenous contrast. COMPARISON:  CT HEAD October 24, 2015 FINDINGS: BRAIN: The ventricles and sulci are normal for age. No intraparenchymal  hemorrhage, mass effect nor midline shift. Patchy supratentorial white matter hypodensities within normal range for patient's age, though non-specific are most compatible with chronic small vessel ischemic disease. Old LEFT caudate lacunar infarct. No acute large vascular territory infarcts. No abnormal extra-axial fluid collections. Basal cisterns are patent. VASCULAR: Moderate calcific atherosclerosis of the carotid siphons. SKULL: No skull fracture. No significant scalp soft tissue swelling. SINUSES/ORBITS:  The mastoid air-cells and included paranasal sinuses are well-aerated. Status post bilateral ocular lens implants. The included ocular globes and orbital contents are non-suspicious. OTHER: Patient is edentulous. ASPECTS [facility] Stroke Program Early CT Score) - Ganglionic level infarction (caudate, lentiform nuclei, internal capsule, insula, M1-M3 cortex): 7 - Supraganglionic infarction (M4-M6 cortex): 3 Total score (0-10 with 10 being normal): 10 IMPRESSION: 1. No acute intracranial process. Old LEFT basal ganglia lacunar infarct and mild to moderate chronic small vessel ischemic disease. 2. ASPECTS is 10. Dr. Shon Hale paged March 04, 2016 at 18:32hours, pending return call. Electronically Signed   By: Elon Alas M.D.   On: 03/04/2016 18:33    EKG & Cardiac Imaging    EKG: Ventricular paced with a rate of 73 bpm, no acute changes  Echocardiogram:   10/24/2015 echo  Study Conclusions - Left ventricle: The cavity size was normal. There was mild   concentric hypertrophy. Systolic function was normal. The   estimated ejection fraction was in the range of 55% to 60%. Wall   motion was normal; there were no regional wall motion   abnormalities. Doppler parameters are consistent with abnormal   left ventricular relaxation (grade 1 diastolic dysfunction). - Mitral valve: Calcified annulus. Mildly thickened, mildly   calcified leaflets . - Right ventricle: Pacer wire or catheter noted in right  ventricle.  Impressions: - No cardiac source of emboli was indentified.  Assessment & Plan    Neurologic symptoms  -Right sided face, arm, and leg numbness and weakness.We have been asked to consult for chest pain and assessment for possible atrial fibrillation. -No atrial fibrillation found on PM/ICD interrogation---Reviewed with EP and [employer] rep noting 8 seconds atrial flutter in 06/2015, 4 seconds aflutter in July and November 2017. No atrial fib/flutter in December or January.  -Currently no indication for anticoagulation related to atrial fibrillation  Chest pain -The patient has ongoing intermittent chest pain worse with lying on her left side since her CABG in 11/2014. Treated with isosorbide -EKG shows paced rhythm with no changes -First troponin is negative -No indications of ACS -Continue BB, isosorbide, statin, Plavix  ICD -PM placed in 2001 for complete heart block -Upgraded to BiV ICD in 2008 for severe cardiomyopathy with EF 20%, downgraded to CRT-P in 2014 ([place]) following excellent response from resynchronization with normalization of LV function.   Chronic diastolic heart failure -She has chronic DOE and orthopnea, also has asthma and OSA. Her breathing is not worse than her usual. She has some congestion and light increase in her usual cough.  -Her BNP is mildly elevated at 173.2.  -CXR shows clear lungs with no active disease. -Most recent echo 11/03/2015 showed EF 55% to 60%. Wall motion was normal; there were no regional wall motion abnormalities. Doppler parameters are consistent with abnormal left ventricular relaxation (grade 1 diastolic dysfunction). -Continue demadex, carvedilol, aldactone  CKD stage 3 -Cr 1.40, approximating baseline -Manage per IM  OSA -unable to tolerate CPAP in the past. Re-evaluated by Dr. Davonna Belling, 02/26/16, with plans for CPAP titration study and try new CPAP machine   Signed, Daune Perch,  NP-C 03/05/2016, 10:17 AM Pager: [phone]  Patient examined chart reviewed. Obese white female bronchitic chest tightness Right sided paresthesias gone AICD under right clavicle. No murmur obese.  CABG 11/2014 Troponin negative ECG with biV pacing and BNP Only 700 range. Doubt anginal pain r/o continue home meds can consider outpatient myovue. Reviewed interogation report from AICD and no PAF last 2 months  Indicated 8 beats of atrial tachycardia in October and two 4 beat episodes In November that would not make anticoagulation needed.   Jenkins Rouge

## 2016-03-06 ENCOUNTER — Encounter: Payer: Self-pay | Admitting: Cardiology

## 2016-03-06 DIAGNOSIS — R071 Chest pain on breathing: Secondary | ICD-10-CM | POA: Diagnosis not present

## 2016-03-06 DIAGNOSIS — R200 Anesthesia of skin: Secondary | ICD-10-CM | POA: Diagnosis not present

## 2016-03-06 DIAGNOSIS — I428 Other cardiomyopathies: Secondary | ICD-10-CM | POA: Diagnosis not present

## 2016-03-06 LAB — CUP PACEART REMOTE DEVICE CHECK
Battery Remaining Longevity: 96 mo
Battery Remaining Percentage: 100 %
Brady Statistic RA Percent Paced: 0 %
Brady Statistic RV Percent Paced: 100 %
Date Time Interrogation Session: 20180124132400
Implantable Lead Implant Date: 20080923
Implantable Lead Implant Date: 20080923
Implantable Lead Implant Date: 20080923
Implantable Lead Location: 753858
Implantable Lead Location: 753859
Implantable Lead Location: 753860
Implantable Lead Model: 4285
Implantable Lead Model: 4555
Implantable Lead Model: 7120
Implantable Lead Serial Number: 170220
Implantable Lead Serial Number: 486468
Implantable Pulse Generator Implant Date: 20140923
Lead Channel Impedance Value: 616 Ohm
Lead Channel Impedance Value: 823 Ohm
Lead Channel Impedance Value: 866 Ohm
Lead Channel Pacing Threshold Amplitude: 0.8 V
Lead Channel Pacing Threshold Amplitude: 1.2 V
Lead Channel Pacing Threshold Pulse Width: 0.4 ms
Lead Channel Pacing Threshold Pulse Width: 0.8 ms
Lead Channel Setting Pacing Amplitude: 1.9 V
Lead Channel Setting Pacing Amplitude: 2 V
Lead Channel Setting Pacing Pulse Width: 0.4 ms
Lead Channel Setting Pacing Pulse Width: 0.8 ms
Lead Channel Setting Sensing Sensitivity: 2.5 mV
Lead Channel Setting Sensing Sensitivity: 2.5 mV
Pulse Gen Serial Number: 100731

## 2016-03-06 LAB — CBC
HCT: 47 % — ABNORMAL HIGH (ref 36.0–46.0)
Hemoglobin: 15.3 g/dL — ABNORMAL HIGH (ref 12.0–15.0)
MCH: 28.8 pg (ref 26.0–34.0)
MCHC: 32.6 g/dL (ref 30.0–36.0)
MCV: 88.5 fL (ref 78.0–100.0)
Platelets: 138 10*3/uL — ABNORMAL LOW (ref 150–400)
RBC: 5.31 MIL/uL — ABNORMAL HIGH (ref 3.87–5.11)
RDW: 14 % (ref 11.5–15.5)
WBC: 5.2 10*3/uL (ref 4.0–10.5)

## 2016-03-06 LAB — BASIC METABOLIC PANEL
Anion gap: 10 (ref 5–15)
BUN: 18 mg/dL (ref 6–20)
CO2: 31 mmol/L (ref 22–32)
Calcium: 9.6 mg/dL (ref 8.9–10.3)
Chloride: 98 mmol/L — ABNORMAL LOW (ref 101–111)
Creatinine, Ser: 1.2 mg/dL — ABNORMAL HIGH (ref 0.44–1.00)
GFR calc Af Amer: 51 mL/min — ABNORMAL LOW (ref >60)
GFR calc non Af Amer: 44 mL/min — ABNORMAL LOW (ref >60)
Glucose, Bld: 132 mg/dL — ABNORMAL HIGH (ref 65–99)
Potassium: 3.7 mmol/L (ref 3.5–5.1)
Sodium: 139 mmol/L (ref 135–145)

## 2016-03-06 LAB — GLUCOSE, CAPILLARY: Glucose-Capillary: 124 mg/dL — ABNORMAL HIGH (ref 65–99)

## 2016-03-06 NOTE — Progress Notes (Signed)
Subjective:  Denies SSCP, palpitations or Dyspnea   Objective:  Vitals:   03/05/16 1400 03/05/16 1450 03/05/16 1929 03/05/16 2138  BP: 137/57 (!) 188/81  138/73  Pulse: 91 88  86  Resp: 19 20  20   Temp:  98.7 F (37.1 C)  98.6 F (37 C)  TempSrc:  Oral  Oral  SpO2: 92% 93% 94% 96%  Weight:  246 lb 9.6 oz (111.9 kg)    Height:  5\' 5"  (1.651 m)      Intake/Output from previous day:  Intake/Output Summary (Last 24 hours) at 03/06/16 0854 Last data filed at 03/06/16 7616  Gross per 24 hour  Intake              240 ml  Output                0 ml  Net              240 ml    Physical Exam: Affect appropriate Obese white female  HEENT: normal Neck supple with no adenopathy JVP normal no bruits no thyromegaly Lungs clear with no wheezing and good diaphragmatic motion Heart:  S1/S2 no murmur, no rub, gallop or click PMI normal AICD under right clavicle  Abdomen: benighn, BS positve, no tenderness, no AAA no bruit.  No HSM or HJR Distal pulses intact with no bruits No edema Neuro non-focal Skin warm and dry No muscular weakness   Lab Results: Basic Metabolic Panel:  Recent Labs  03/04/16 1824 03/04/16 1831  NA 141 141  K 3.9 3.8  CL 104 102  CO2 30  --   GLUCOSE 110* 110*  BUN 17 21*  CREATININE 1.28* 1.40*  CALCIUM 9.3  --    Liver Function Tests:  Recent Labs  03/04/16 1824  AST 25  ALT 30  ALKPHOS 118  BILITOT 0.6  PROT 6.3*  ALBUMIN 3.5   No results for input(s): LIPASE, AMYLASE in the last 72 hours. CBC:  Recent Labs  03/04/16 1824 03/04/16 1831 03/06/16 0637  WBC 5.9  --  5.2  NEUTROABS 3.3  --   --   HGB 14.2 15.0 15.3*  HCT 44.5 44.0 47.0*  MCV 89.9  --  88.5  PLT 157  --  138*   Cardiac Enzymes:  Recent Labs  03/05/16 0730 03/05/16 1313 03/05/16 1807  TROPONINI <0.03 <0.03 <0.03   BNP: Invalid input(s): POCBNP D-Dimer:  Recent Labs  03/05/16 1054  DDIMER 0.83*    Imaging: Nm Pulmonary Perf And  Vent  Result Date: 03/05/2016 CLINICAL DATA:  Short of breath, elevated D-dimer EXAM: NUCLEAR MEDICINE VENTILATION - PERFUSION LUNG SCAN TECHNIQUE: Ventilation images were obtained in multiple projections using inhaled aerosol Tc-96m DTPA. Perfusion images were obtained in multiple projections after intravenous injection of Tc-35m MAA. RADIOPHARMACEUTICALS:  31.5 mCi Technetium-63m DTPA aerosol inhalation and 4.1 mCi Technetium-49m MAA IV COMPARISON:  Chest radiograph 03/04/2016 FINDINGS: Ventilation: No focal ventilation defect. Perfusion: No wedge shaped peripheral perfusion defects to suggest acute pulmonary embolism. IMPRESSION: No evidence of pulmonary embolism. Electronically Signed   By: Suzy Bouchard M.D.   On: 03/05/2016 16:33   Dg Chest Port 1 View  Result Date: 03/04/2016 CLINICAL DATA:  Right arm weakness. Coronary artery disease. Pacemaker. EXAM: PORTABLE CHEST 1 VIEW COMPARISON:  10/23/2015 FINDINGS: Heart size and mediastinal contours are stable. Aortic atherosclerosis. Transvenous pacemaker is unchanged in position. Prior CABG. Both lungs are clear. No evidence of pneumothorax or pleural effusion. IMPRESSION:  Stable exam.  No active disease. Electronically Signed   By: Earle Gell M.D.   On: 03/04/2016 21:44   Ct Head Code Stroke Wo Contrast`  Result Date: 03/04/2016 CLINICAL DATA:  Code stroke. RIGHT-sided weakness. Last seen normal at 1500 hours. Assess stroke. History of hypertension, diabetes, TIA. EXAM: CT HEAD WITHOUT CONTRAST TECHNIQUE: Contiguous axial images were obtained from the base of the skull through the vertex without intravenous contrast. COMPARISON:  CT HEAD October 24, 2015 FINDINGS: BRAIN: The ventricles and sulci are normal for age. No intraparenchymal hemorrhage, mass effect nor midline shift. Patchy supratentorial white matter hypodensities within normal range for patient's age, though non-specific are most compatible with chronic small vessel ischemic disease.  Old LEFT caudate lacunar infarct. No acute large vascular territory infarcts. No abnormal extra-axial fluid collections. Basal cisterns are patent. VASCULAR: Moderate calcific atherosclerosis of the carotid siphons. SKULL: No skull fracture. No significant scalp soft tissue swelling. SINUSES/ORBITS: The mastoid air-cells and included paranasal sinuses are well-aerated. Status post bilateral ocular lens implants. The included ocular globes and orbital contents are non-suspicious. OTHER: Patient is edentulous. ASPECTS [facility] Stroke Program Early CT Score) - Ganglionic level infarction (caudate, lentiform nuclei, internal capsule, insula, M1-M3 cortex): 7 - Supraganglionic infarction (M4-M6 cortex): 3 Total score (0-10 with 10 being normal): 10 IMPRESSION: 1. No acute intracranial process. Old LEFT basal ganglia lacunar infarct and mild to moderate chronic small vessel ischemic disease. 2. ASPECTS is 10. Dr. Shon Hale paged March 04, 2016 at 18:32hours, pending return call. Electronically Signed   By: Elon Alas M.D.   On: 03/04/2016 18:33    Cardiac Studies:  ECG:  SR BiV pacing    Telemetry:  NSR 03/06/2016   Echo: EF 55-60% no significant valve disease   Medications:   . aspirin EC  81 mg Oral Daily  . carvedilol  6.25 mg Oral BID WC  . clopidogrel  75 mg Oral Daily  . colchicine  0.6 mg Oral BID  . fluorometholone  1 drop Both Eyes BID  . ipratropium-albuterol  3 mL Nebulization BID  . isosorbide mononitrate  30 mg Oral Daily  . pantoprazole  40 mg Oral Daily  . potassium chloride SA  20 mEq Oral Daily  . rosuvastatin  20 mg Oral q1800  . spironolactone  25 mg Oral Daily  . torsemide  40 mg Oral BID      Assessment/Plan:  Neurologic symptoms  -Right sided face, arm, and leg numbness and weakness.We have been asked to consult for chest pain and assessment for possible atrial fibrillation. -No atrial fibrillation found on PM/ICD interrogation---Reviewed with EP and [employer] rep noting 8 seconds atrial flutter in 06/2015, 4 seconds aflutter in July and November 2017. No atrial fib/flutter in December or January.  -Currently no indication for anticoagulation related to atrial fibrillation  Chest pain -resolved R/O CABG 2016 outpatient f/u Dr C continue medical Rx  ICD -PM placed in 2001 for complete heart block -Upgraded to BiV ICD in 2008 for severe cardiomyopathy with EF 20%, downgraded to CRT-P in 2014 ([place]) following excellent response from resynchronization with normalization of LV function.   Chronic diastolic heart failure -She has chronic DOE and orthopnea, also has asthma and OSA. Her breathing is not worse than her usual. She has some congestion and light increase in her usual cough.  -Her BNP is mildly elevated at 173.2.  -CXR shows clear lungs with no active disease. -Most recent echo 11/03/2015 showed EF 55% to  60%. Wall motion was normal; there were no regional wall motionabnormalities. Doppler parameters are consistent with abnormal left ventricular relaxation (grade 1 diastolic dysfunction). -Continue demadex, carvedilol, aldactone  CKD stage 3 -Cr 1.40, approximating baseline -Manage per IM  OSA -unable to tolerate CPAP in the past. Re-evaluated by Dr. Davonna Belling, 02/26/16, with plans for CPAP titration study and try new CPAP machine  Discussed with Dr Gardenia Phlegm ok to d/c home   [name] 03/06/2016, 8:54 AM

## 2016-03-06 NOTE — Discharge Summary (Signed)
Physician Discharge Summary  [name] IRS:[id number] DOB: [birth year] DOA: 03/04/2016  PCP: [name], MD  Admit date: 03/04/2016 Discharge date: 03/06/2016  Admitted From: home Disposition:  home  Recommendations for Outpatient Follow-up:  1. Follow up with PCP in 1-2 weeks  Home Health: none Equipment/Devices: none  Discharge Condition: stable CODE STATUS: Full Diet recommendation: heart healthy  HPI: Per Dr. Eulas Post, [name] is a 71 y.o. woman with a history of CAD S/P CABG, ischemic cardiomyopathy S/P Bi-V ICD/PPM implant, prior TIA, HTN, DM, and CKD 3 who presents to the ED via EMS for evaluation of right sided face, arm, and leg numbness and weakness.  Symptoms started around 3PM.  She presented with similar symptoms on the LEFT in September 2017.  She was diagnosed with a TIA at that time.  She could not have an MRI because of her ICD, and she did not have CT with contrast because of an iodine allergy (anaphylaxis).  She saw neurology (Dr. Rosalin Hawking) for follow up in November.  She tells me that she was advised to report to the ED for urgent evaluation if similar symptoms ever returned.  Dr. [name] also documented that he would consider CTA of the head and neck if she had recurrent symptoms.  The patient is also complaining of chest pain.  It is substernal and radiating to her left side and into her back.  It has been associated with light-headedness and shortness of breath, but no syncope.    Hospital Course: Discharge Diagnoses:  Principal Problem:   Right facial numbness Active Problems:   Essential hypertension   Coronary atherosclerosis   Morbid obesity due to excess calories (HCC)   Cardiomyopathy, nonischemic, Bi V responder,  EF 65-70% echo Oct 2015   S/P CABG x 5   Chronic diastolic heart failure (HCC)   Chest pain   CKD (chronic kidney disease), stage III  Right sided weakness - Patient was admitted to the hospital with transient  right-sided weakness. Neurology evaluated the patient and the emergency room, did not feel like the right-sided weakness this further workup per Dr. Shon Hale. Her weakness resolved rapidly. Chest pain - given extensive coronary artery disease history with history of CABG, presents of ICD and PPM, cardiology was consulted and have followed patient hospitalized. Recommendations were for continuing medical management without further workup. Her cardiac enzymes have remained negative. Her device was interrogated by cardiology without any significant findings. She had a d-dimer checked which was elevated at 0.8, and given her allergies to IV dye as well as chronic kidney disease she underwent a VQ scan which was negative for PE. Her chest pain resolved, and she was discharged home in stable condition with close outpatient follow-up. Chronic diastolic heart failure - appears compensated, continue home medications on discharge Chronic kidney disease stage III - creatinine is at baseline on discharge at 1.2.  OSA - outpatient follow up. Difficulties tolerating CPAP  Discharge Instructions   Allergies as of 03/06/2016      Reactions   Ivp Dye [iodinated Diagnostic Agents] Shortness Of Breath   No reaction to PO contrast with non-ionic dye.06-25-2014/rsm   Shellfish-derived Products Anaphylaxis   Sulfa Antibiotics Shortness Of Breath   Uloric [febuxostat] Nausea And Vomiting   Iodine Hives   Atorvastatin Other (See Comments)   Pt states "causes bilateral leg pain/cramps."   Cephalexin Itching, Rash   Rash & itch after Keflex   Zithromax [azithromycin] Rash      Medication  List    TAKE these medications   acetaminophen 500 MG tablet Commonly known as:  TYLENOL Take 1,000 mg by mouth every 6 (six) hours as needed (for pain.).   albuterol 108 (90 Base) MCG/ACT inhaler Commonly known as:  PROVENTIL HFA;VENTOLIN HFA Inhale 2 puffs into the lungs every 4 (four) hours as needed for wheezing (wheezing).     carvedilol 6.25 MG tablet Commonly known as:  COREG Take 1 tablet (6.25 mg total) by mouth 2 (two) times daily with a meal.   cetirizine 10 MG tablet Commonly known as:  ZYRTEC Take 10 mg by mouth daily.   clopidogrel 75 MG tablet Commonly known as:  PLAVIX Take 1 tablet (75 mg total) by mouth daily.   clotrimazole 10 MG troche Commonly known as:  MYCELEX Take 10 mg by mouth every 4 (four) hours as needed (mouth pain).   colchicine 0.6 MG tablet Take 0.6 mg by mouth 2 (two) times daily.   EPINEPHrine 0.3 mg/0.3 mL Soaj injection Commonly known as:  EPI-PEN Inject 0.3 mg into the muscle daily as needed (allergic reaction).   fluorometholone 0.1 % ophthalmic suspension Commonly known as:  FML Place 1 drop into both eyes 2 (two) times daily.   fluticasone 50 MCG/ACT nasal spray Commonly known as:  FLONASE Place 1 spray into the nose 2 (two) times daily as needed for rhinitis or allergies.   isosorbide mononitrate 30 MG 24 hr tablet Commonly known as:  IMDUR Take 1 tablet (30 mg total) by mouth daily.   omeprazole 40 MG capsule Commonly known as:  PRILOSEC Take 1 capsule (40 mg total) by mouth 2 (two) times daily before a meal.   potassium chloride SA 20 MEQ tablet Commonly known as:  KLOR-CON M20 Take 1 tablet (20 mEq total) by mouth daily.   rosuvastatin 20 MG tablet Commonly known as:  CRESTOR Take 1 tablet (20 mg total) by mouth daily.   spironolactone 25 MG tablet Commonly known as:  ALDACTONE Take 1 tablet (25 mg total) by mouth daily.   SYMBICORT 160-4.5 MCG/ACT inhaler Generic drug:  budesonide-formoterol Inhale 2 puffs into the lungs 2 (two) times daily.   torsemide 20 MG tablet Commonly known as:  DEMADEX Take 2 tablets (40 mg total) by mouth 2 (two) times daily.   triamcinolone cream 0.1 % Commonly known as:  KENALOG Apply 1 application topically daily as needed (applied to affected itchy area(s)).      Follow-up Information    [name], MD. Schedule an appointment as soon as possible for a visit in 2 week(s).   Specialty:  Family Medicine Contact information: [address] [place] [address] [phone]          Allergies  Allergen Reactions  . Ivp Dye [Iodinated Diagnostic Agents] Shortness Of Breath    No reaction to PO contrast with non-ionic dye.06-25-2014/rsm  . Shellfish-Derived Products Anaphylaxis  . Sulfa Antibiotics Shortness Of Breath  . Uloric [Febuxostat] Nausea And Vomiting  . Iodine Hives  . Atorvastatin Other (See Comments)    Pt states "causes bilateral leg pain/cramps."  . Cephalexin Itching and Rash    Rash & itch after Keflex  . Zithromax [Azithromycin] Rash    Consultations:  Neurology   Cardiology   Procedures/Studies:  Nm Pulmonary Perf And Vent  Result Date: 03/05/2016 CLINICAL DATA:  Short of breath, elevated D-dimer EXAM: NUCLEAR MEDICINE VENTILATION - PERFUSION LUNG SCAN TECHNIQUE: Ventilation images were obtained in multiple projections using  inhaled aerosol Tc-61m DTPA. Perfusion images were obtained in multiple projections after intravenous injection of Tc-72m MAA. RADIOPHARMACEUTICALS:  31.5 mCi Technetium-79m DTPA aerosol inhalation and 4.1 mCi Technetium-8m MAA IV COMPARISON:  Chest radiograph 03/04/2016 FINDINGS: Ventilation: No focal ventilation defect. Perfusion: No wedge shaped peripheral perfusion defects to suggest acute pulmonary embolism. IMPRESSION: No evidence of pulmonary embolism. Electronically Signed   By: Suzy Bouchard M.D.   On: 03/05/2016 16:33   Dg Chest Port 1 View  Result Date: 03/04/2016 CLINICAL DATA:  Right arm weakness. Coronary artery disease. Pacemaker. EXAM: PORTABLE CHEST 1 VIEW COMPARISON:  10/23/2015 FINDINGS: Heart size and mediastinal contours are stable. Aortic atherosclerosis. Transvenous pacemaker is unchanged in position. Prior CABG. Both lungs are clear. No evidence of pneumothorax or pleural effusion.  IMPRESSION: Stable exam.  No active disease. Electronically Signed   By: Earle Gell M.D.   On: 03/04/2016 21:44   Ct Head Code Stroke Wo Contrast`  Result Date: 03/04/2016 CLINICAL DATA:  Code stroke. RIGHT-sided weakness. Last seen normal at 1500 hours. Assess stroke. History of hypertension, diabetes, TIA. EXAM: CT HEAD WITHOUT CONTRAST TECHNIQUE: Contiguous axial images were obtained from the base of the skull through the vertex without intravenous contrast. COMPARISON:  CT HEAD October 24, 2015 FINDINGS: BRAIN: The ventricles and sulci are normal for age. No intraparenchymal hemorrhage, mass effect nor midline shift. Patchy supratentorial white matter hypodensities within normal range for patient's age, though non-specific are most compatible with chronic small vessel ischemic disease. Old LEFT caudate lacunar infarct. No acute large vascular territory infarcts. No abnormal extra-axial fluid collections. Basal cisterns are patent. VASCULAR: Moderate calcific atherosclerosis of the carotid siphons. SKULL: No skull fracture. No significant scalp soft tissue swelling. SINUSES/ORBITS: The mastoid air-cells and included paranasal sinuses are well-aerated. Status post bilateral ocular lens implants. The included ocular globes and orbital contents are non-suspicious. OTHER: Patient is edentulous. ASPECTS [facility] Stroke Program Early CT Score) - Ganglionic level infarction (caudate, lentiform nuclei, internal capsule, insula, M1-M3 cortex): 7 - Supraganglionic infarction (M4-M6 cortex): 3 Total score (0-10 with 10 being normal): 10 IMPRESSION: 1. No acute intracranial process. Old LEFT basal ganglia lacunar infarct and mild to moderate chronic small vessel ischemic disease. 2. ASPECTS is 10. Dr. Shon Hale paged March 04, 2016 at 18:32hours, pending return call. Electronically Signed   By: Elon Alas M.D.   On: 03/04/2016 18:33      Subjective: - no chest pain, shortness of breath, no abdominal pain,  nausea or vomiting.   Discharge Exam: Vitals:   03/06/16 1052 03/06/16 1503  BP: 132/75 (!) 108/57  Pulse: 90 85  Resp: 18 15  Temp: 97.8 F (36.6 C) 98 F (36.7 C)   Vitals:   03/05/16 1929 03/05/16 2138 03/06/16 1052 03/06/16 1503  BP:  138/73 132/75 (!) 108/57  Pulse:  86 90 85  Resp:  20 18 15   Temp:  98.6 F (37 C) 97.8 F (36.6 C) 98 F (36.7 C)  TempSrc:  Oral Oral Oral  SpO2: 94% 96% 94% 93%  Weight:      Height:        General: Pt is alert, awake, not in acute distress Cardiovascular: RRR, S1/S2 +, no rubs, no gallops Respiratory: CTA bilaterally, no wheezing, no rhonchi Abdominal: Soft, NT, ND, bowel sounds + Extremities: no edema, no cyanosis    The results of significant diagnostics from this hospitalization (including imaging, microbiology, ancillary and laboratory) are listed below for reference.     Microbiology: No results  found for this or any previous visit (from the past 240 hour(s)).   Labs: BNP (last 3 results)  Recent Labs  10/18/15 1204 03/04/16 2142  BNP 55.7 211.1*   Basic Metabolic Panel:  Recent Labs Lab 03/04/16 1824 03/04/16 1831 03/06/16 0806  NA 141 141 139  K 3.9 3.8 3.7  CL 104 102 98*  CO2 30  --  31  GLUCOSE 110* 110* 132*  BUN 17 21* 18  CREATININE 1.28* 1.40* 1.20*  CALCIUM 9.3  --  9.6   Liver Function Tests:  Recent Labs Lab 03/04/16 1824  AST 25  ALT 30  ALKPHOS 118  BILITOT 0.6  PROT 6.3*  ALBUMIN 3.5   No results for input(s): LIPASE, AMYLASE in the last 168 hours. No results for input(s): AMMONIA in the last 168 hours. CBC:  Recent Labs Lab 03/04/16 1824 03/04/16 1831 03/06/16 0637  WBC 5.9  --  5.2  NEUTROABS 3.3  --   --   HGB 14.2 15.0 15.3*  HCT 44.5 44.0 47.0*  MCV 89.9  --  88.5  PLT 157  --  138*   Cardiac Enzymes:  Recent Labs Lab 03/05/16 0730 03/05/16 1313 03/05/16 1807  TROPONINI <0.03 <0.03 <0.03   BNP: Invalid input(s): POCBNP CBG:  Recent Labs Lab  03/05/16 2133 03/06/16 0908  GLUCAP 121* 124*   D-Dimer  Recent Labs  03/05/16 1054  DDIMER 0.83*   Hgb A1c No results for input(s): HGBA1C in the last 72 hours. Lipid Profile No results for input(s): CHOL, HDL, LDLCALC, TRIG, CHOLHDL, LDLDIRECT in the last 72 hours. Thyroid function studies No results for input(s): TSH, T4TOTAL, T3FREE, THYROIDAB in the last 72 hours.  Invalid input(s): FREET3 Anemia work up No results for input(s): VITAMINB12, FOLATE, FERRITIN, TIBC, IRON, RETICCTPCT in the last 72 hours. Urinalysis    Component Value Date/Time   COLORURINE YELLOW 10/22/2015 2353   APPEARANCEUR CLOUDY (A) 10/22/2015 2353   LABSPEC 1.026 10/22/2015 2353   PHURINE 5.5 10/22/2015 2353   GLUCOSEU NEGATIVE 10/22/2015 2353   HGBUR NEGATIVE 10/22/2015 2353   BILIRUBINUR NEGATIVE 10/22/2015 2353   KETONESUR NEGATIVE 10/22/2015 2353   PROTEINUR NEGATIVE 10/22/2015 2353   UROBILINOGEN 0.2 12/06/2014 1531   NITRITE NEGATIVE 10/22/2015 2353   LEUKOCYTESUR SMALL (A) 10/22/2015 2353   Sepsis Labs Invalid input(s): PROCALCITONIN,  WBC,  LACTICIDVEN Microbiology No results found for this or any previous visit (from the past 240 hour(s)).   Time coordinating discharge: Over 30 minutes  SIGNED:  Marzetta Board, MD  [facility] 03/06/2016, 6:12 PM Pager [phone]  If 7PM-7AM, please contact night-coverage [email or web address] Password [email or web address]

## 2016-03-06 NOTE — Discharge Instructions (Signed)
Follow with [name], MD in 2-3 weeks  Please get a complete blood count and chemistry panel checked by your Primary MD at your next visit, and again as instructed by your Primary MD. Please get your medications reviewed and adjusted by your Primary MD.  Please request your Primary MD to go over all Hospital Tests and Procedure/Radiological results at the follow up, please get all Hospital records sent to your Prim MD by signing hospital release before you go home.  If you had Pneumonia of Lung problems at the Hospital: Please get a 2 view Chest X ray done in 6-8 weeks after hospital discharge or sooner if instructed by your Primary MD.  If you have Congestive Heart Failure: Please call your Cardiologist or Primary MD anytime you have any of the following symptoms:  1) 3 pound weight gain in 24 hours or 5 pounds in 1 week  2) shortness of breath, with or without a dry hacking cough  3) swelling in the hands, feet or stomach  4) if you have to sleep on extra pillows at night in order to breathe  Follow cardiac low salt diet and 1.5 lit/day fluid restriction.  If you have diabetes Accuchecks 4 times/day, Once in AM empty stomach and then before each meal. Log in all results and show them to your primary doctor at your next visit. If any glucose reading is under 80 or above 300 call your primary MD immediately.  If you have Seizure/Convulsions/Epilepsy: Please do not drive, operate heavy machinery, participate in activities at heights or participate in high speed sports until you have seen by Primary MD or a Neurologist and advised to do so again.  If you had Gastrointestinal Bleeding: Please ask your Primary MD to check a complete blood count within one week of discharge or at your next visit. Your endoscopic/colonoscopic biopsies that are pending at the time of discharge, will also need to followed by your Primary MD.  Get Medicines reviewed and adjusted. Please take all your  medications with you for your next visit with your Primary MD  Please request your Primary MD to go over all hospital tests and procedure/radiological results at the follow up, please ask your Primary MD to get all Hospital records sent to his/her office.  If you experience worsening of your admission symptoms, develop shortness of breath, life threatening emergency, suicidal or homicidal thoughts you must seek medical attention immediately by calling 911 or calling your MD immediately  if symptoms less severe.  You must read complete instructions/literature along with all the possible adverse reactions/side effects for all the Medicines you take and that have been prescribed to you. Take any new Medicines after you have completely understood and accpet all the possible adverse reactions/side effects.   Do not drive or operate heavy machinery when taking Pain medications.   Do not take more than prescribed Pain, Sleep and Anxiety Medications  Special Instructions: If you have smoked or chewed Tobacco  in the last 2 yrs please stop smoking, stop any regular Alcohol  and or any Recreational drug use.  Wear Seat belts while driving.  Please note You were cared for by a hospitalist during your hospital stay. If you have any questions about your discharge medications or the care you received while you were in the hospital after you are discharged, you can call the unit and asked to speak with the hospitalist on call if the hospitalist that took care of you is not available. Once  you are discharged, your primary care physician will handle any further medical issues. Please note that NO REFILLS for any discharge medications will be authorized once you are discharged, as it is imperative that you return to your primary care physician (or establish a relationship with a primary care physician if you do not have one) for your aftercare needs so that they can reassess your need for medications and monitor your  lab values.  You can reach the hospitalist office at phone [phone] or fax [phone]   If you do not have a primary care physician, you can call [phone] for a physician referral.  Activity: As tolerated with Full fall precautions use walker/cane & assistance as needed  Diet: heart healthy  Disposition Home

## 2016-03-06 NOTE — Evaluation (Signed)
Occupational Therapy Evaluation and Discharge Patient Details Name: [name] MRN: [id number] DOB: 1945-07-11 Today's Date: 03/06/2016    History of Present Illness 71 y.o.woman with a history of CAD S/P CABG, ischemic cardiomyopathy S/P Bi-V ICD/PPM implant, prior TIA, HTN, DM, and CKD 3 who presents to the ED via EMS for evaluation of right sided face, arm, and leg numbness and weakness. CT negative for acute infarct. The patient is also complaining of chest pain. It is substernal and radiating to her left side and into her back. It has been associated with light-headedness and shortness of breath, but no syncope.    Clinical Impression   Pt reports she was independent with ADL PTA. Currently pt overall supervision for ADL and functional mobility, quickly approaching mod I. Pt reports R sided numbness/weakness has resolved and she currently feels at baseline with mild intermittent chest pain. Pt aware of signs/symptoms of CVA and need to return to hospital if experiencing any signs/symptoms of CVA. Pt planning to d/c home with 24/7 supervision from family. No further acute OT needs identified; signing off at this time. Please re-consult if needs change. Thank you for this referral.    Follow Up Recommendations  No OT follow up;Supervision/Assistance - 24 hour    Equipment Recommendations  None recommended by OT    Recommendations for Other Services       Precautions / Restrictions Precautions Precautions: None Restrictions Weight Bearing Restrictions: No      Mobility Bed Mobility               General bed mobility comments: Pt OOB in chair upon arrival  Transfers Overall transfer level: Needs assistance Equipment used: None Transfers: Sit to/from Stand Sit to Stand: Supervision         General transfer comment: Supervision for safety. No unsteadiness or LOB, no c/o dizziness.    Balance Overall balance assessment: No apparent balance deficits (not  formally assessed)                                          ADL Overall ADL's : Needs assistance/impaired Eating/Feeding: Set up;Sitting   Grooming: Supervision/safety;Standing   Upper Body Bathing: Set up;Sitting   Lower Body Bathing: Supervison/ safety;Sit to/from stand   Upper Body Dressing : Set up;Sitting   Lower Body Dressing: Supervision/safety;Sit to/from stand   Toilet Transfer: Supervision/safety;Ambulation;Regular Toilet;Grab bars   Toileting- Clothing Manipulation and Hygiene: Supervision/safety;Sit to/from stand   Tub/ Shower Transfer: Supervision/safety;Walk-in shower;Ambulation Tub/Shower Transfer Details (indicate cue type and reason): Discussed use of shower chair if needed for safety. Functional mobility during ADLs: Supervision/safety General ADL Comments: Educated pt on home safety and fall prevention strategies; pt verbalized understanding.     Vision Vision Assessment?: No apparent visual deficits   Perception     Praxis      Pertinent Vitals/Pain Pain Assessment: Faces Faces Pain Scale: Hurts a little bit Pain Location: chest, head Pain Descriptors / Indicators: Sore Pain Intervention(s): Monitored during session     Hand Dominance Right   Extremity/Trunk Assessment Upper Extremity Assessment Upper Extremity Assessment: Overall WFL for tasks assessed   Lower Extremity Assessment Lower Extremity Assessment: Defer to PT evaluation   Cervical / Trunk Assessment Cervical / Trunk Assessment: Other exceptions Cervical / Trunk Exceptions: body habitus   Communication Communication Communication: No difficulties   Cognition Arousal/Alertness: Awake/alert Behavior During Therapy: [facility]  for tasks assessed/performed Overall Cognitive Status: Within Functional Limits for tasks assessed                     General Comments       Exercises       Shoulder Instructions      Home Living Family/patient expects to be  discharged to:: Private residence Living Arrangements: Children Available Help at Discharge: Family;Available 24 hours/day Type of Home: House Home Access: Stairs to enter [employer] of Steps: 3   Home Layout: Two level;Able to live on main level with bedroom/bathroom     Bathroom Shower/Tub: Occupational psychologist: Standard     Home Equipment: Environmental consultant - 2 wheels;Shower seat;Grab bars - tub/shower          Prior Functioning/Environment Level of Independence: Independent                 OT Problem List:     OT Treatment/Interventions:      OT Goals(Current goals can be found in the care plan section) Acute Rehab OT Goals Patient Stated Goal: return home OT Goal Formulation: All assessment and education complete, DC therapy  OT Frequency:     Barriers to D/C:            Co-evaluation              End of Session Nurse Communication: Mobility status  Activity Tolerance: Patient tolerated treatment well Patient left: in chair;with call bell/phone within reach   Time: 0902-0914 OT Time Calculation (min): 12 min Charges:  OT General Charges $OT Visit: 1 Procedure OT Evaluation $OT Eval Moderate Complexity: 1 Procedure G-Codes: OT G-codes **NOT FOR INPATIENT CLASS** Functional Assessment Tool Used: Clinical judgement Functional Limitation: Self care Self Care Current Status (K8638): At least 1 percent but less than 20 percent impaired, limited or restricted Self Care Goal Status (T7711): At least 1 percent but less than 20 percent impaired, limited or restricted Self Care Discharge Status [phone]): At least 1 percent but less than 20 percent impaired, limited or restricted   Binnie Kand M.S., OTR/L Pager: [phone]  03/06/2016, 9:19 AM

## 2016-03-06 NOTE — Evaluation (Signed)
Physical Therapy Evaluation Patient Details Name: [name] MRN: [id number] DOB: 1946/02/06 Today's Date: 03/06/2016   History of Present Illness  71 y.o.woman with a history of CAD S/P CABG, ischemic cardiomyopathy S/P Bi-V ICD/PPM implant, prior TIA, HTN, DM, and CKD 3 who presents to the ED via EMS for evaluation of right sided face, arm, and leg numbness and weakness. CT negative for acute infarct. The patient is also complaining of chest pain. It is substernal and radiating to her left side and into her back. It has been associated with light-headedness and shortness of breath, but no syncope.   Clinical Impression  Patient presents with mobility close to her baseline.  Verbalized interest in starting exercise routine and encouraged follow up with cardiologist prior to starting.  Feel currently able to function at home safely with son present most of the day.  No further acute skilled PT needs at this time.     Follow Up Recommendations No PT follow up    Equipment Recommendations  None recommended by PT    Recommendations for Other Services       Precautions / Restrictions Precautions Precautions: None Restrictions Weight Bearing Restrictions: No      Mobility  Bed Mobility               General bed mobility comments: Pt OOB in chair upon arrival  Transfers Overall transfer level: Needs assistance Equipment used: None Transfers: Sit to/from Stand Sit to Stand: Modified independent (Device/Increase time)         General transfer comment: Supervision for safety. No unsteadiness or LOB, no c/o dizziness.  Ambulation/Gait Ambulation/Gait assistance: Supervision Ambulation Distance (Feet): 125 Feet (x 2) Assistive device: None Gait Pattern/deviations: Step-through pattern;Wide base of support;Decreased stride length;Trendelenburg     General Gait Details: increased lateral weight shift with possible trendelenberg on R (reports this pattern is  baseline)  Stairs Stairs: Yes Stairs assistance: Supervision;Min guard Stair Management: One rail Right;Step to pattern;Forwards Number of Stairs: 3 General stair comments: assist for safety, cues for technique (but reports is her usual technique)  Wheelchair Mobility    Modified Rankin (Stroke Patients Only) Modified Rankin (Stroke Patients Only) Pre-Morbid Rankin Score: Moderate disability Modified Rankin: Slight disability     Balance Overall balance assessment: No apparent balance deficits (not formally assessed)                                           Pertinent Vitals/Pain Pain Assessment: Faces Faces Pain Scale: Hurts a little bit Pain Location: chest, head Pain Descriptors / Indicators: Sore Pain Intervention(s): Monitored during session    Home Living Family/patient expects to be discharged to:: Private residence Living Arrangements: Children Available Help at Discharge: Family;Available 24 hours/day Type of Home: House Home Access: Stairs to enter Entrance Stairs-Rails: Psychiatric nurse of Steps: 3 Home Layout: Two level;Able to live on main level with bedroom/bathroom Home Equipment: Gilford Rile - 2 wheels;Shower seat;Grab bars - tub/shower      Prior Function Level of Independence: Independent         Comments: was participating in exercise group until gout flare     Hand Dominance   Dominant Hand: Right    Extremity/Trunk Assessment   Upper Extremity Assessment Upper Extremity Assessment: Overall WFL for tasks assessed    Lower Extremity Assessment Lower Extremity Assessment: RLE deficits/detail RLE Deficits / Details: slightly  weaker than L but at least 4/5    Cervical / Trunk Assessment Cervical / Trunk Assessment: Other exceptions Cervical / Trunk Exceptions: body habitus  Communication   Communication: No difficulties  Cognition Arousal/Alertness: Awake/alert Behavior During Therapy: WFL for tasks  assessed/performed Overall Cognitive Status: Within Functional Limits for tasks assessed                      General Comments General comments (skin integrity, edema, etc.): patient reports has gym membership and wants to start walking program.  Encouraged to visit cardiologist first and get clearance with any exercise routine to start.  Also discussed gradual increase in activity.  Noted HR 107 after ambulation and mild SOB    Exercises     Assessment/Plan    PT Assessment Patent does not need any further PT services  PT Problem List            PT Treatment Interventions      PT Goals (Current goals can be found in the Care Plan section)  Acute Rehab PT Goals Patient Stated Goal: return home    Frequency     Barriers to discharge        Co-evaluation               End of Session Equipment Utilized During Treatment: Gait belt Activity Tolerance: Patient tolerated treatment well Patient left: with call bell/phone within reach;in chair      Functional Assessment Tool Used: Clinical Judgement Functional Limitation: Mobility: Walking and moving around Mobility: Walking and Moving Around Current Status ([id number]): At least 1 percent but less than 20 percent impaired, limited or restricted Mobility: Walking and Moving Around Goal Status [phone]): At least 1 percent but less than 20 percent impaired, limited or restricted Mobility: Walking and Moving Around Discharge Status [phone]): At least 1 percent but less than 20 percent impaired, limited or restricted    Time: 1027-1047 PT Time Calculation (min) (ACUTE ONLY): 20 min   Charges:   PT Evaluation $PT Eval Moderate Complexity: 1 Procedure     PT G Codes:   PT G-Codes **NOT FOR INPATIENT CLASS** Functional Assessment Tool Used: Clinical Judgement Functional Limitation: Mobility: Walking and moving around Mobility: Walking and Moving Around Current Status ([id number]): At least 1 percent but less than 20 percent  impaired, limited or restricted Mobility: Walking and Moving Around Goal Status [phone]): At least 1 percent but less than 20 percent impaired, limited or restricted Mobility: Walking and Moving Around Discharge Status [phone]): At least 1 percent but less than 20 percent impaired, limited or restricted    Gracious Renken 03/06/2016, 11:49 AM  Magda Kiel, PT [phone] 03/06/2016

## 2016-03-11 ENCOUNTER — Other Ambulatory Visit: Payer: Self-pay

## 2016-03-11 DIAGNOSIS — D508 Other iron deficiency anemias: Secondary | ICD-10-CM

## 2016-03-12 ENCOUNTER — Other Ambulatory Visit: Payer: Self-pay | Admitting: Physician Assistant

## 2016-03-14 ENCOUNTER — Ambulatory Visit: Payer: Commercial Managed Care - HMO | Admitting: Nurse Practitioner

## 2016-03-20 DIAGNOSIS — E119 Type 2 diabetes mellitus without complications: Secondary | ICD-10-CM | POA: Diagnosis not present

## 2016-03-20 DIAGNOSIS — I5033 Acute on chronic diastolic (congestive) heart failure: Secondary | ICD-10-CM | POA: Diagnosis not present

## 2016-03-20 DIAGNOSIS — Z09 Encounter for follow-up examination after completed treatment for conditions other than malignant neoplasm: Secondary | ICD-10-CM | POA: Diagnosis not present

## 2016-03-20 DIAGNOSIS — M109 Gout, unspecified: Secondary | ICD-10-CM | POA: Diagnosis not present

## 2016-03-20 DIAGNOSIS — R21 Rash and other nonspecific skin eruption: Secondary | ICD-10-CM | POA: Diagnosis not present

## 2016-03-25 ENCOUNTER — Telehealth (HOSPITAL_COMMUNITY): Payer: Self-pay | Admitting: Family Medicine

## 2016-03-25 ENCOUNTER — Encounter (HOSPITAL_COMMUNITY): Payer: Self-pay | Admitting: Family Medicine

## 2016-03-25 NOTE — Progress Notes (Signed)
Mailed letter with Cardiac Rehab Program .... KJ

## 2016-03-25 NOTE — Telephone Encounter (Signed)
Mailed letter with Cardiac Rehab Program to pt ... KJ  °

## 2016-03-27 ENCOUNTER — Ambulatory Visit: Payer: Medicare HMO | Admitting: Cardiology

## 2016-03-27 ENCOUNTER — Ambulatory Visit (INDEPENDENT_AMBULATORY_CARE_PROVIDER_SITE_OTHER): Payer: Medicare HMO | Admitting: Cardiology

## 2016-03-27 ENCOUNTER — Encounter: Payer: Self-pay | Admitting: Cardiology

## 2016-03-27 DIAGNOSIS — I428 Other cardiomyopathies: Secondary | ICD-10-CM

## 2016-03-27 DIAGNOSIS — N183 Chronic kidney disease, stage 3 unspecified: Secondary | ICD-10-CM

## 2016-03-27 DIAGNOSIS — Z951 Presence of aortocoronary bypass graft: Secondary | ICD-10-CM

## 2016-03-27 DIAGNOSIS — I10 Essential (primary) hypertension: Secondary | ICD-10-CM | POA: Diagnosis not present

## 2016-03-27 DIAGNOSIS — G4733 Obstructive sleep apnea (adult) (pediatric): Secondary | ICD-10-CM

## 2016-03-27 DIAGNOSIS — R079 Chest pain, unspecified: Secondary | ICD-10-CM | POA: Diagnosis not present

## 2016-03-27 DIAGNOSIS — G458 Other transient cerebral ischemic attacks and related syndromes: Secondary | ICD-10-CM

## 2016-03-27 DIAGNOSIS — I480 Paroxysmal atrial fibrillation: Secondary | ICD-10-CM

## 2016-03-27 MED ORDER — ASPIRIN EC 81 MG PO TBEC
81.0000 mg | DELAYED_RELEASE_TABLET | Freq: Every day | ORAL | 3 refills | Status: DC
Start: 2016-03-27 — End: 2022-03-24

## 2016-03-27 NOTE — Assessment & Plan Note (Signed)
last SCr 1.2

## 2016-03-27 NOTE — Assessment & Plan Note (Signed)
Pt responded to BiV ICD- last EF 55-60% Sept 2017

## 2016-03-27 NOTE — Assessment & Plan Note (Signed)
Suspected TIA Sept 2017- no MRI done secondary to ICD, no CTA secondary to h/o contrast allergy. Recent hospitalization Jan 2018 again with suspected TIA.

## 2016-03-27 NOTE — Assessment & Plan Note (Signed)
Admitted 1/23/-03/06/16 with chest pain- MI r/o, medical Rx

## 2016-03-27 NOTE — Assessment & Plan Note (Signed)
Questionable history of A Fib/A Flutter. On device interrogation on 9/7, showed 0.58min of A Fib/A Flutter with 1% burden.  Device check in Jan 2018 showed no sustained AF (runs of less than 30 seconds). No anticoagulation indicated.

## 2016-03-27 NOTE — Assessment & Plan Note (Signed)
Controlled.  

## 2016-03-27 NOTE — Progress Notes (Signed)
03/27/2016 [name]   March 14, 1945  [id number]  Primary Physician [name], MD Primary Cardiologist: Dr [name]  HPI:  Pleasant morbidly obese female with a history of NICM, s/p BS BiV ICD in 2007 who ultimately did have CABG in Oct 2016. He last EF was 55-60% by echo in Sept 2017 when she presented with a suspected TIA (Lt facial and arm weakness). No CTA done secondary to a history of contrast allergy and no MRI done secondary to her ICD. There was concern she may have PAF then, though no significant PAF documented.   She presented to the ED again 03/05/16, this time with Rt facial, arm, and leg numbness. She also some some chest pain, MI was r/o, no further symptoms. Her ICD was interrogated and showed no significant PAF and anticoagulation was not recommended. She is on Plavix and is followed by Dr [name], she has an appointment next month. Since discharge she has done well, no significant chest pain or recurrent numbness.    Current Outpatient Prescriptions  Medication Sig Dispense Refill  . acetaminophen (TYLENOL) 500 MG tablet Take 1,000 mg by mouth every 6 (six) hours as needed (for pain.).    [name] albuterol (PROVENTIL HFA;VENTOLIN HFA) 108 (90 Base) MCG/ACT inhaler Inhale 2 puffs into the lungs every 4 (four) hours as needed for wheezing (wheezing). 6.7 g 3  . carvedilol (COREG) 6.25 MG tablet Take 1 tablet (6.25 mg total) by mouth 2 (two) times daily with a meal. 180 tablet 3  . cetirizine (ZYRTEC) 10 MG tablet Take 10 mg by mouth daily.     . clopidogrel (PLAVIX) 75 MG tablet Take 1 tablet (75 mg total) by mouth daily. 90 tablet 3  . clotrimazole (MYCELEX) 10 MG troche Take 10 mg by mouth every 4 (four) hours as needed (mouth pain).    . colchicine 0.6 MG tablet Take 0.6 mg by mouth 2 (two) times daily.    [name] doxycycline (DORYX) 100 MG EC tablet Take 100 mg by mouth 2 (two) times daily.    [name] EPINEPHrine 0.3 mg/0.3 mL IJ SOAJ injection Inject 0.3 mg into the muscle daily as  needed (allergic reaction).     . fluorometholone (FML) 0.1 % ophthalmic suspension Place 1 drop into both eyes 2 (two) times daily.    . fluticasone (FLONASE) 50 MCG/ACT nasal spray Place 1 spray into the nose 2 (two) times daily as needed for rhinitis or allergies.     . isosorbide mononitrate (IMDUR) 30 MG 24 hr tablet Take 1 tablet (30 mg total) by mouth daily. 30 tablet 0  . omeprazole (PRILOSEC) 40 MG capsule Take 1 capsule (40 mg total) by mouth 2 (two) times daily before a meal. 60 capsule 11  . potassium chloride SA (KLOR-CON M20) 20 MEQ tablet Take 1 tablet (20 mEq total) by mouth daily. 90 tablet 3  . rosuvastatin (CRESTOR) 20 MG tablet Take 1 tablet (20 mg total) by mouth daily. 90 tablet 3  . spironolactone (ALDACTONE) 25 MG tablet Take 1 tablet (25 mg total) by mouth daily. 90 tablet 3  . SYMBICORT 160-4.5 MCG/ACT inhaler Inhale 2 puffs into the lungs 2 (two) times daily.    [name] torsemide (DEMADEX) 20 MG tablet Take 2 tablets (40 mg total) by mouth 2 (two) times daily. 180 tablet 3  . triamcinolone cream (KENALOG) 0.1 % Apply 1 application topically daily as needed (applied to affected itchy area(s)).     . aspirin EC 81 MG tablet  Take 1 tablet (81 mg total) by mouth daily. 90 tablet 3   No current facility-administered medications for this visit.     Allergies  Allergen Reactions  . Ivp Dye [Iodinated Diagnostic Agents] Shortness Of Breath    No reaction to PO contrast with non-ionic dye.06-25-2014/rsm  . Shellfish-Derived Products Anaphylaxis  . Sulfa Antibiotics Shortness Of Breath  . Uloric [Febuxostat] Nausea And Vomiting  . Iodine Hives  . Atorvastatin Other (See Comments)    Pt states "causes bilateral leg pain/cramps."  . Cephalexin Itching and Rash    Rash & itch after Keflex  . Zithromax [Azithromycin] Rash    Social History   Social History  . Marital status: Widowed    Spouse name: N/A  . Number of children: 5  . Years of education: N/A   Occupational  History  . 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 Smoking status: Former Smoker    Packs/day: 0.25    Years: 3.00    Types: Cigarettes    Quit date: 02/11/1968  . Smokeless tobacco: Never Used  . Alcohol use No  . Drug use: No  . Sexual activity: No   Other Topics Concern  . Not on file   Social History Narrative   Widowed last year. She lives with her two sons.     Review of Systems: General: negative for chills, fever, night sweats or weight changes.  Cardiovascular: negative for chest pain, dyspnea on exertion, edema, orthopnea, palpitations, paroxysmal nocturnal dyspnea or shortness of breath Dermatological: negative for rash Respiratory: negative for cough or wheezing On Mucinex for sinusitis.  Urologic: negative for hematuria Abdominal: negative for nausea, vomiting, diarrhea, bright red blood per rectum, melena, or hematemesis Neurologic: negative for visual changes, syncope, or dizziness All other systems reviewed and are otherwise negative except as noted above.    Blood pressure 108/73, pulse 78, height 5\' 5"  (1.651 m), weight 244 lb (110.7 kg).  General appearance: alert, cooperative, no distress and morbidly obese Neck: no carotid bruit and no JVD Lungs: clear to auscultation bilaterally Heart: regular rate and rhythm Extremities: no edema Skin: Skin color, texture, turgor normal. No rashes or lesions Neurologic: Grossly normal    ASSESSMENT AND PLAN:   TIA (transient ischemic attack) Suspected TIA Sept 2017- no MRI done secondary to ICD, no CTA secondary to h/o contrast allergy. Recent hospitalization Jan 2018 again with suspected TIA.  S/P CABG x 5 CABG x 15 Nov 2014, no chest pain  OSA (obstructive sleep apnea) C-pap intol. Pt has f/u sleep study scheduled for 04/03/16  Nonischemic cardiomyopathy ([place]) Pt responded to BiV ICD- last EF 55-60% Sept 2017  Morbid obesity due to excess calories ([place]) Pt responded to BiV  ICD- last EF 55-60% Sept 2017  Essential hypertension Controlled  CKD (chronic kidney disease), stage III last SCr 1.2  Chest pain with moderate risk of acute coronary syndrome Admitted 1/23/-03/06/16 with chest pain- MI r/o, medical Rx  Atrial fibrillation ([place]) Questionable history of A Fib/A Flutter. On device interrogation on 9/7, showed 0.25min of A Fib/A Flutter with 1% burden.  Device check in Jan 2018 showed no sustained AF (runs of less than 30 seconds). No anticoagulation indicated.   PLAN  The pt is doing well. I encouraged her to give her to follow through with sleep study and C-pap if indicated. I did add ASA 81 mg to her Plavix. F/U with Dr Gwenlyn Found in 3 months.  Kerin Ransom PA-C 03/27/2016  9:09 AM

## 2016-03-27 NOTE — Assessment & Plan Note (Signed)
CABG x 15 Nov 2014, no chest pain

## 2016-03-27 NOTE — Patient Instructions (Addendum)
Start Aspirin 81 mg daily    Your physician recommends that you schedule a follow-up appointment with Dr.Croitoru Monday 06/30/16 at 8:40 am

## 2016-03-27 NOTE — Assessment & Plan Note (Signed)
C-pap intol. Pt has f/u sleep study scheduled for 04/03/16

## 2016-04-03 ENCOUNTER — Ambulatory Visit (HOSPITAL_BASED_OUTPATIENT_CLINIC_OR_DEPARTMENT_OTHER): Payer: Medicare HMO | Attending: Pulmonary Disease | Admitting: Pulmonary Disease

## 2016-04-03 DIAGNOSIS — G4733 Obstructive sleep apnea (adult) (pediatric): Secondary | ICD-10-CM | POA: Diagnosis not present

## 2016-04-04 ENCOUNTER — Telehealth: Payer: Self-pay | Admitting: Pulmonary Disease

## 2016-04-04 DIAGNOSIS — G4733 Obstructive sleep apnea (adult) (pediatric): Secondary | ICD-10-CM

## 2016-04-04 DIAGNOSIS — G4730 Sleep apnea, unspecified: Secondary | ICD-10-CM | POA: Diagnosis not present

## 2016-04-04 NOTE — Telephone Encounter (Signed)
Pt aware of RA's recommendations Order placed. Pt scheduled for f/u on 05-21-16 @ 2:15 Nothing further needed

## 2016-04-04 NOTE — Procedures (Signed)
Patient Name: [name], [name] Date: 04/03/2016 Gender: Female D.O.B: 05/07/1945 Age (years): 29 Referring Provider: Kara Mead MD, ABSM Height (inches): 66 Interpreting Physician: Kara Mead MD, ABSM Weight (lbs): 247 RPSGT: Laren Everts BMI: 40 MRN: [id number] Neck Size: 19.00   CLINICAL INFORMATION The patient is referred for a PAP titration to treat sleep apnea.  Date of NPSG: 2008, showed severe OSA with AHI of 59/hour and lowest desaturations of 58%  SLEEP STUDY TECHNIQUE As per the AASM Manual for the Scoring of Sleep and Associated Events v2.3 (April 2016) with a hypopnea requiring 4% desaturations.  The channels recorded and monitored were frontal, central and occipital EEG, electrooculogram (EOG), submentalis EMG (chin), nasal and oral airflow, thoracic and abdominal wall motion, anterior tibialis EMG, snore microphone, electrocardiogram, and pulse oximetry. Bilevel positive airway pressure (BPAP) was initiated at the beginning of the study and titrated to treat sleep-disordered breathing.  MEDICATIONS Medications self-administered by patient taken the night of the study : CARVEDILOL, STOOL SOFTENER, MUCINEX, PLAVIX, OMEPRAZOLE, SYMBICORT, CRESTOR  RESPIRATORY PARAMETERS Optimal IPAP Pressure (cm): 18 AHI at Optimal Pressure (/hr) 4.7 Optimal EPAP Pressure (cm): 14   Overall Minimal O2 (%): 85.00 Minimal O2 at Optimal Pressure (%): 93.0   SLEEP ARCHITECTURE Start Time: 11:19:07 PM Stop Time: 5:54:54 AM Total Time (min): 395.8 Total Sleep Time (min): 191.4 Sleep Latency (min): 41.4 Sleep Efficiency (%): 48.4 REM Latency (min): 234.0 WASO (min): 163.0 Stage N1 (%): 13.06 Stage N2 (%): 69.44 Stage N3 (%): 0.00 Stage R (%): 17.50 Supine (%): 7.01 Arousal Index (/hr): 25.4       CARDIAC DATA The 2 lead EKG demonstrated pacemaker generated. The mean heart rate was 66.79 beats per minute. Other EKG findings include: PVCs.   LEG MOVEMENT DATA The total  Periodic Limb Movements of Sleep (PLMS) were 0. The PLMS index was 0.00. A PLMS index of <15 is considered normal in adults.  IMPRESSIONS - An optimal PAP pressure was selected for this patient ( 18 /14 cm of water). She was unable to tolerate CPAP due to high pressure & smothering feeling - Central sleep apnea was not noted during this titration (CAI = 2.5/h). - Moderete oxygen desaturations were observed during this titration (min O2 = 85.00%). - The patient snored with Soft snoring volume. - 2-lead EKG demonstrated: PVCs - Clinically significant periodic limb movements were not noted during this study. Arousals associated with PLMs were rare.   DIAGNOSIS - Obstructive Sleep Apnea (327.23 [G47.33 ICD-10])   RECOMMENDATIONS - Trial of BiPAP therapy on 18/14 cm H2O with a Small size Resmed Full Face Mask AirFit F20 mask and heated humidification. - Avoid alcohol, sedatives and other CNS depressants that may worsen sleep apnea and disrupt normal sleep architecture. - Sleep hygiene should be reviewed to assess factors that may improve sleep quality. - Weight management and regular exercise should be initiated or continued. - Return to Sleep Center for re-evaluation after 4 weeks of therapy   Kara Mead MD Board Certified in [place]

## 2016-04-04 NOTE — Telephone Encounter (Signed)
Bipap seemed to help her better Pl send Rx to DME for  Auto BiPAP  - IPAP 10-20, EPAP 5-15, PS 4 cm with a Small size Resmed Full Face Mask AirFit F20 mask and heated humidification DL in 4 wks OV in 6 wks with TP/ me

## 2016-04-16 ENCOUNTER — Ambulatory Visit: Payer: Medicare HMO | Admitting: *Deleted

## 2016-04-24 IMAGING — CR DG CHEST 2V
2 series · 2 of 2 positions shown · non-contrast
Comparison: 02/13/2015.

CLINICAL DATA: Shortness of breath.  Leg swelling.

EXAM:
CHEST  2 VIEW

[w chest pa]
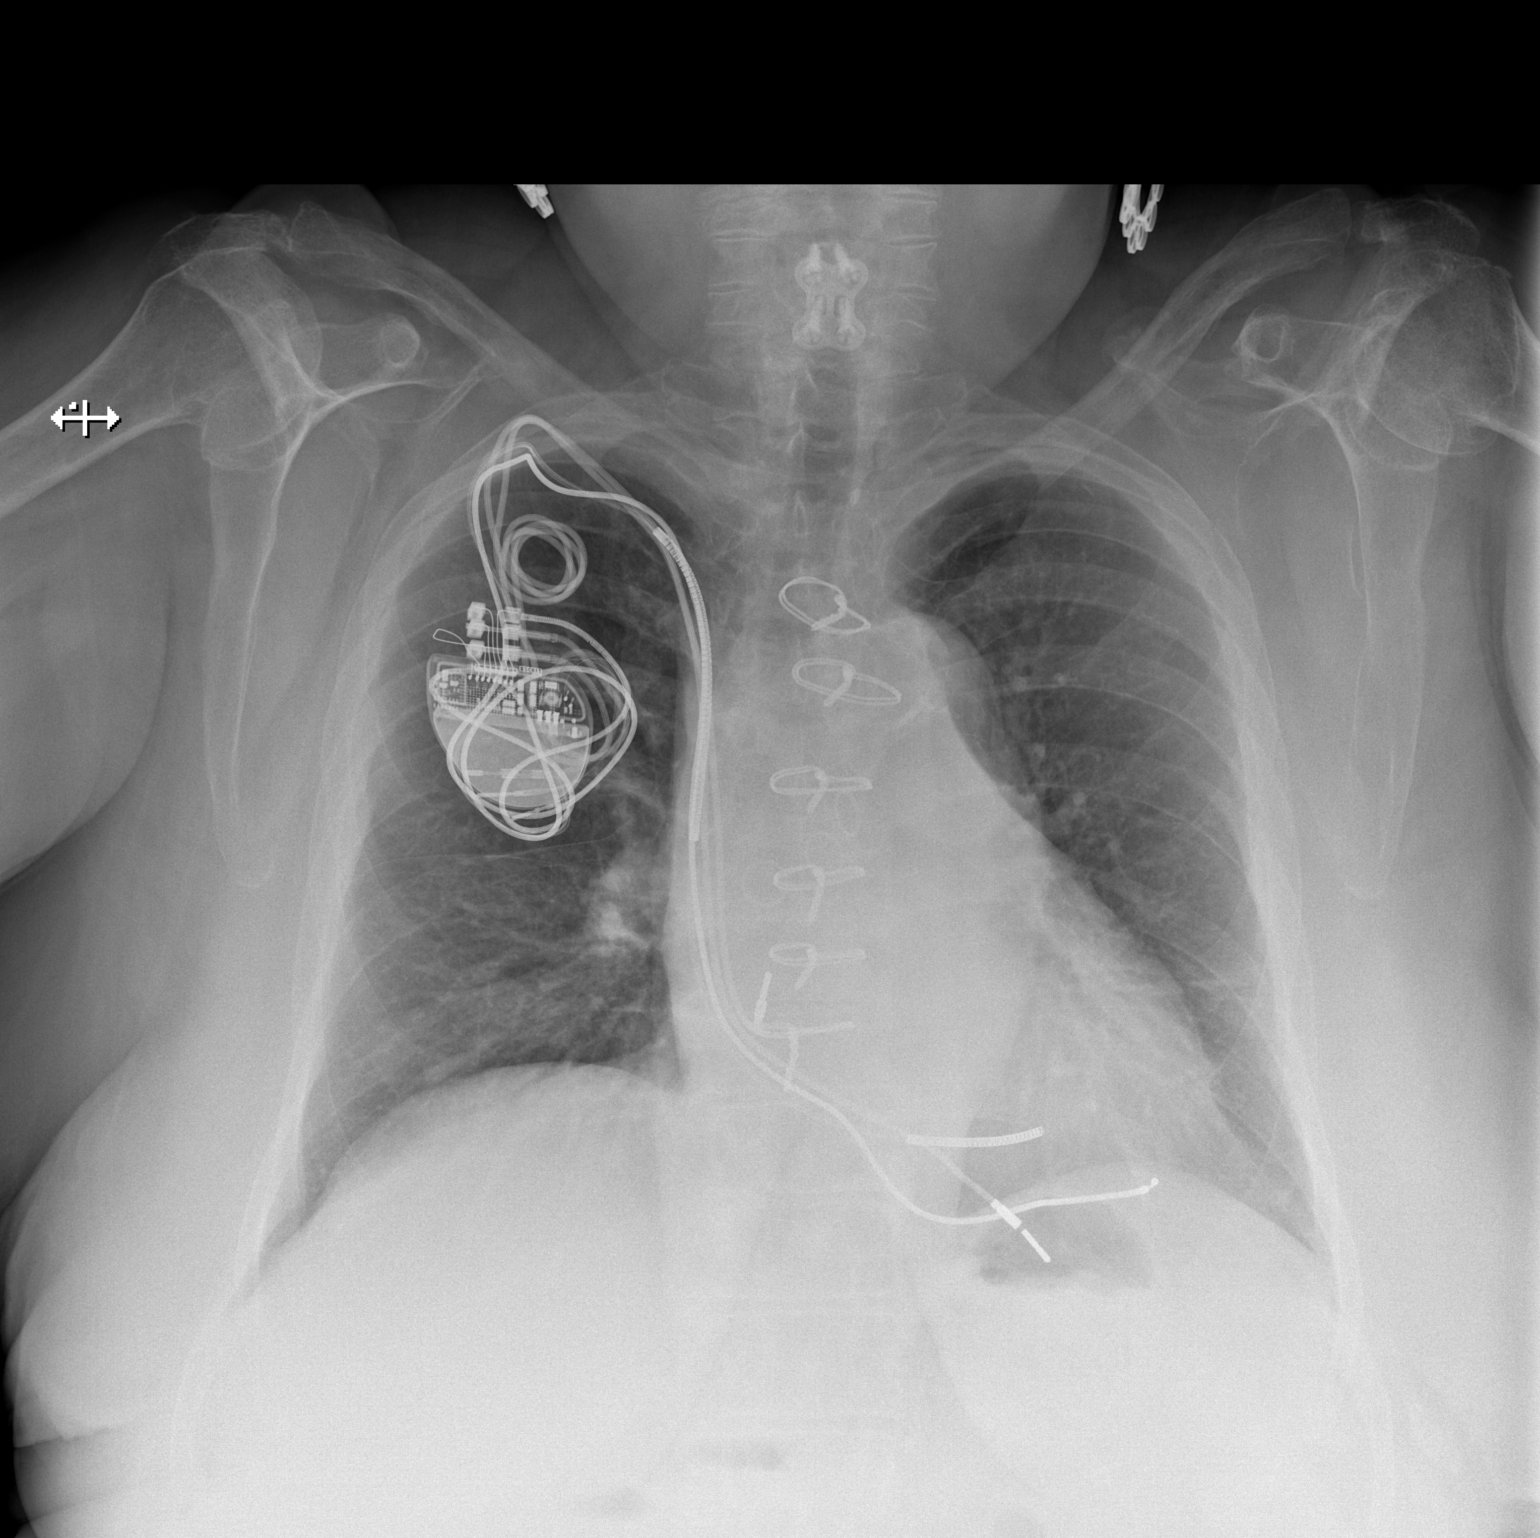

[w chest lat]
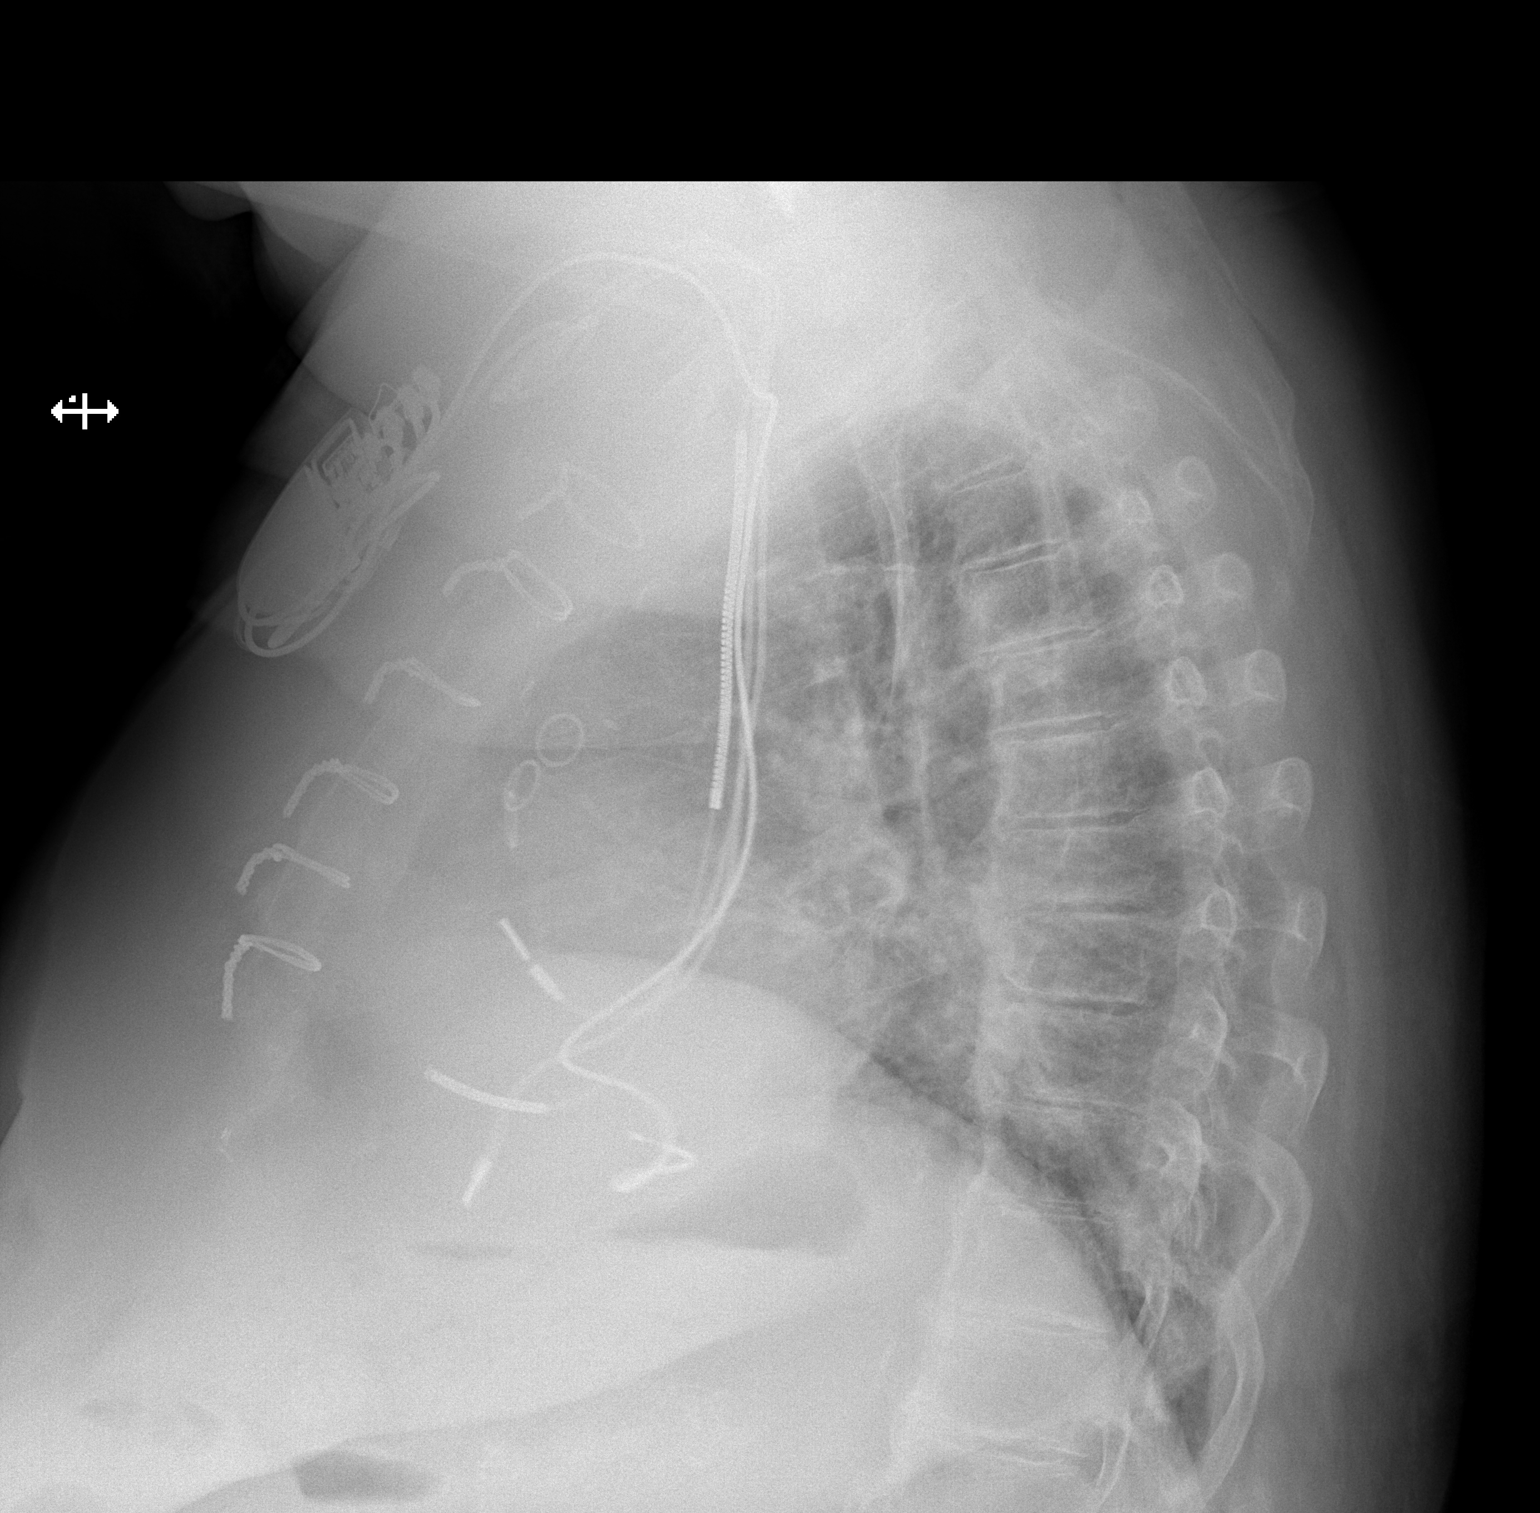

[2 of 2 positions shown; findings below may reference images not displayed]

FINDINGS: The cardiac silhouette remains borderline enlarged. Stable post CABG
changes and right subclavian pacer and AICD leads. Stable linear
scar in the left lower lung zone. Otherwise, clear lungs. Thoracic
spine degenerative changes, including changes of DISH. Cervical
spine fixation hardware.
IMPRESSION: No acute abnormality.

## 2016-04-25 IMAGING — DX DG LUMBAR SPINE 2-3V
3 series · 3 of 3 positions shown · non-contrast
Comparison: No priors.

CLINICAL DATA: 70-year-old female with mid to low back pain (Left
greater than right) for the past 2 weeks. History of fall 1 month
ago. No recent falls.

EXAM:
LUMBAR SPINE - 2-3 VIEW

[l-spine ap]
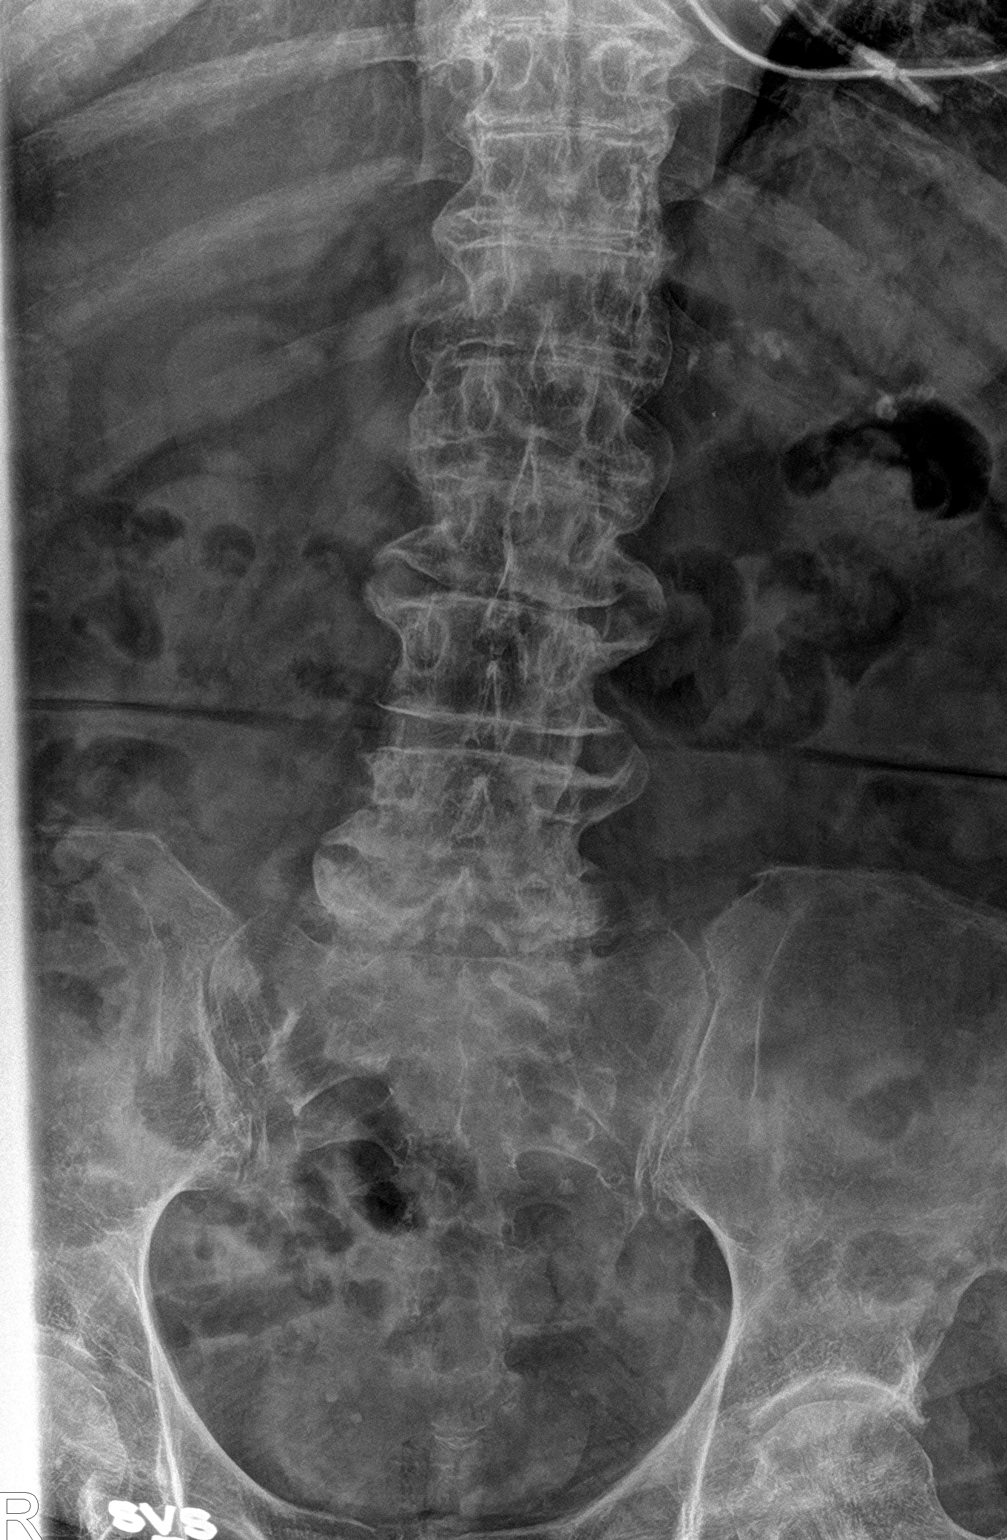

[l-spine lat]
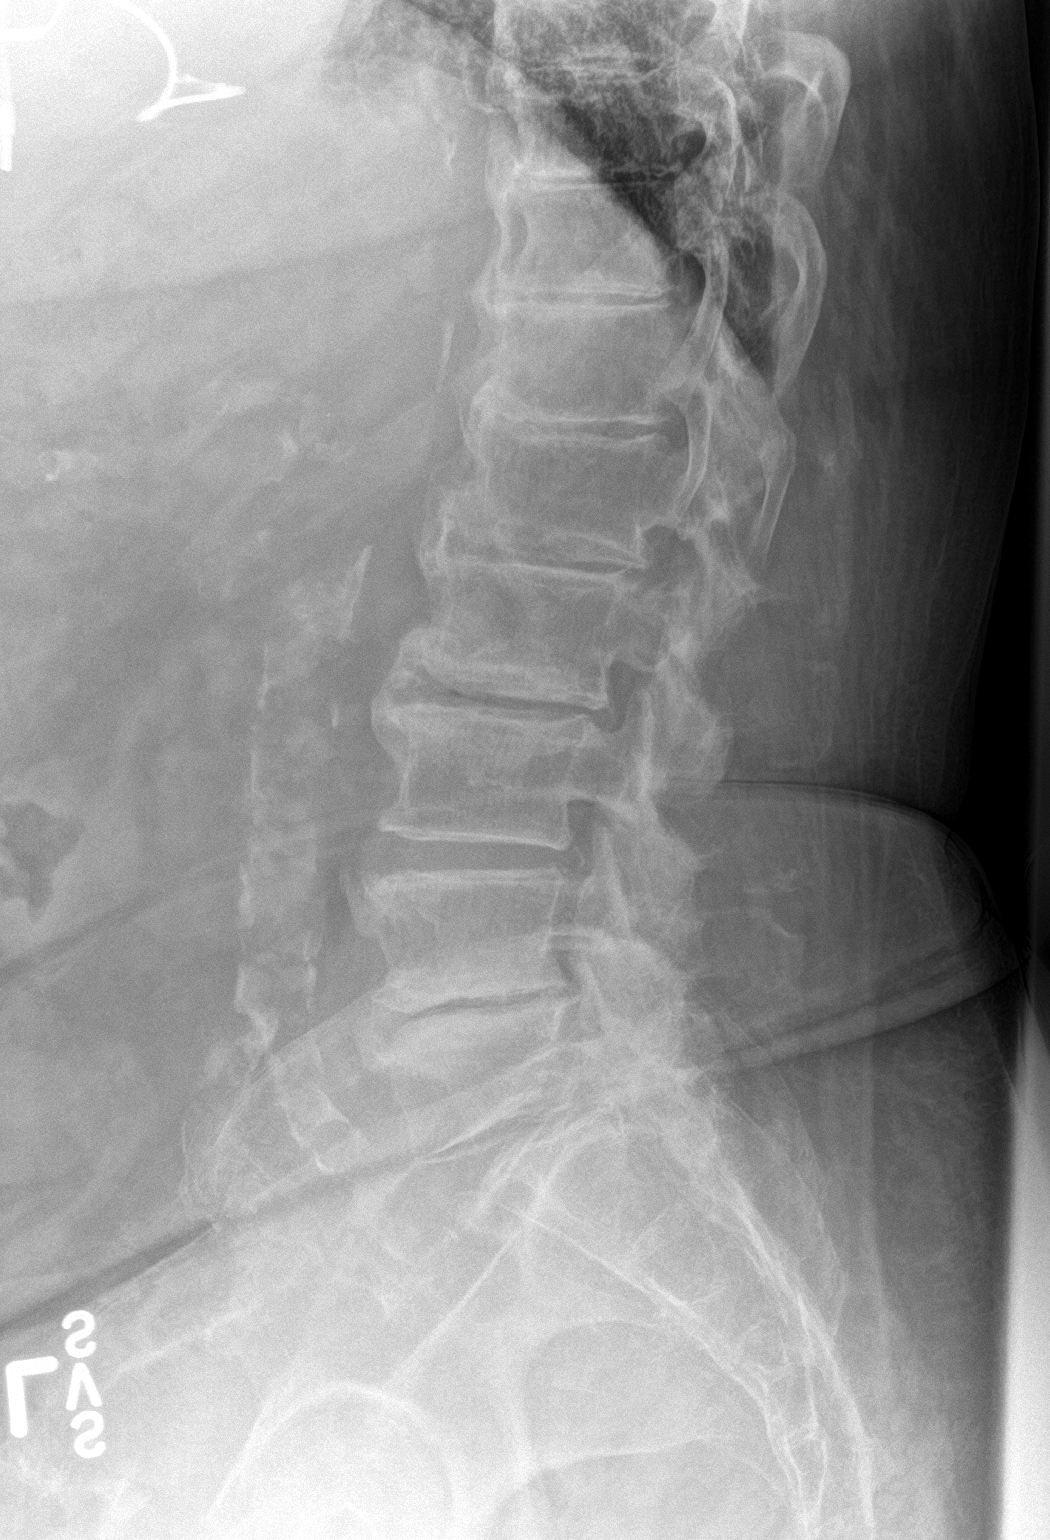

[l-spine spot]
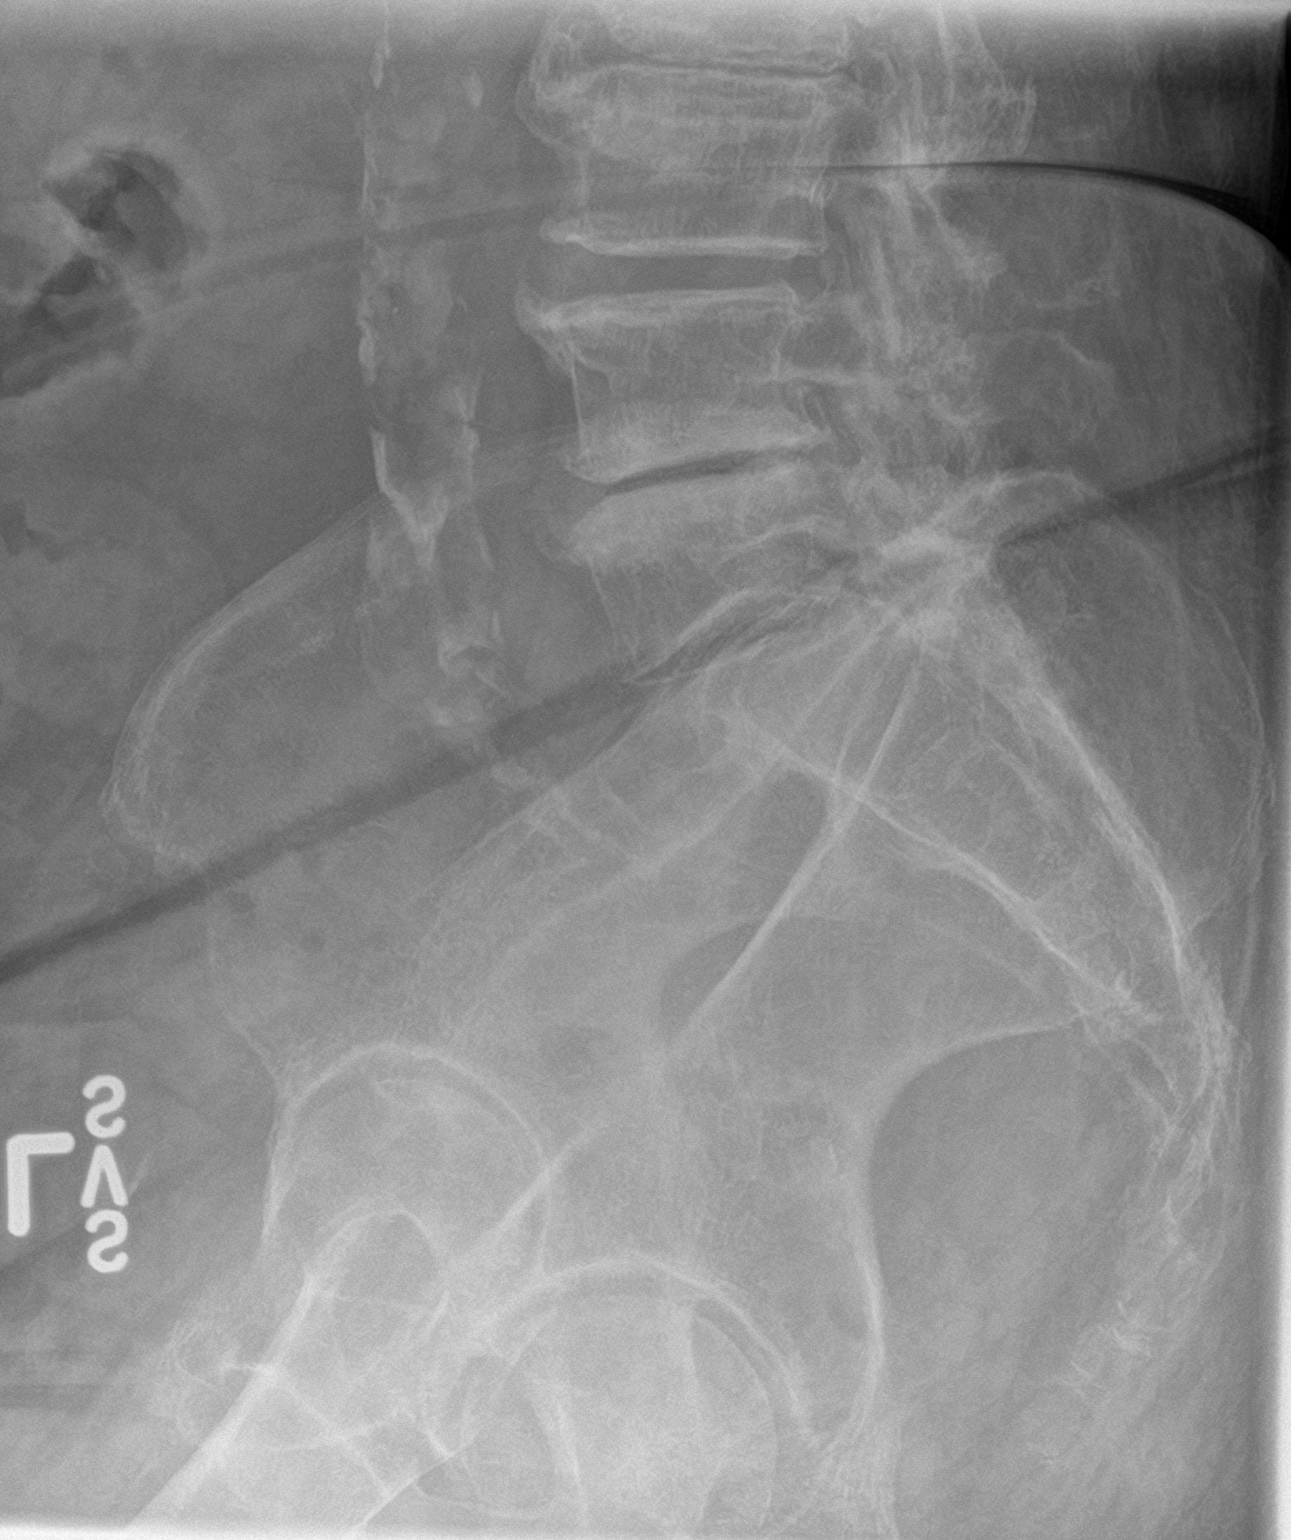

[3 of 3 positions shown; findings below may reference images not displayed]

FINDINGS: Three views of the lumbar spine demonstrate no definite acute
displaced fractures. There is some straightening of normal lumbar
lordosis which is likely chronic and related to underlying
degenerative disease. Multilevel degenerative disc disease, most
severe at T12-L1, L1-L2, L2-L3 and L4-L5. Severe multilevel facet
arthropathy, most severe at L4-L5 and L5-S1. Extensive vascular
calcifications are noted.
IMPRESSION: 1. No acute radiographic abnormality of the lumbar spine.
2. Severe multilevel degenerative disc disease and lumbar
spondylosis, as above.
3. Severe atherosclerosis.

## 2016-04-28 ENCOUNTER — Ambulatory Visit: Payer: Commercial Managed Care - HMO | Admitting: Pulmonary Disease

## 2016-04-29 IMAGING — DX DG FOOT COMPLETE 3+V*L*
3 series · 3 of 3 positions shown · non-contrast
Comparison: None.

CLINICAL DATA: Fall

EXAM:
LEFT FOOT - COMPLETE 3+ VIEW

[foot ap]
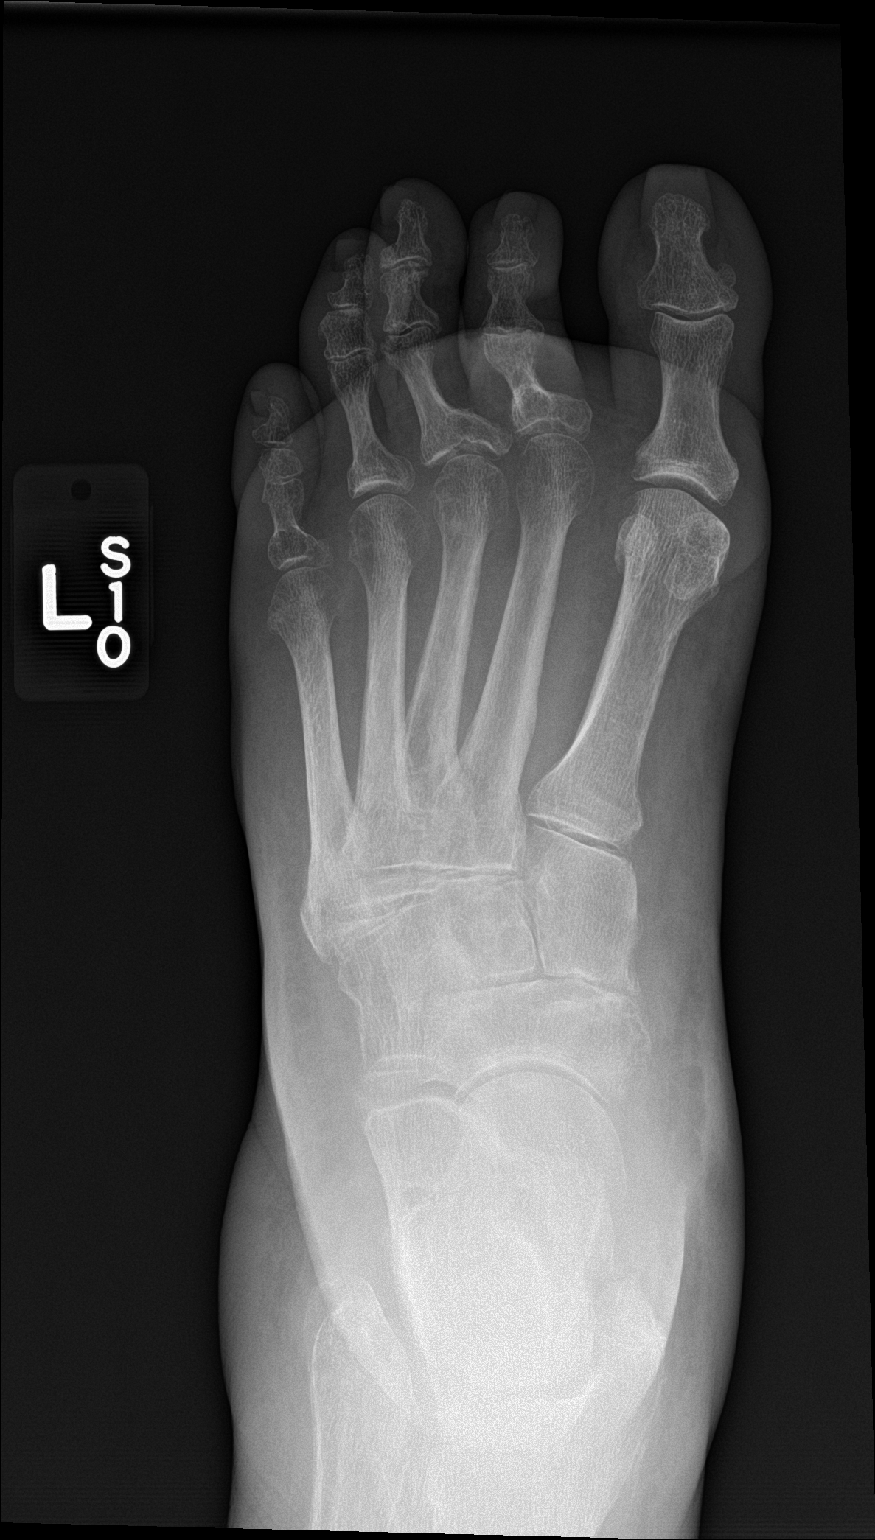

[foot obl]
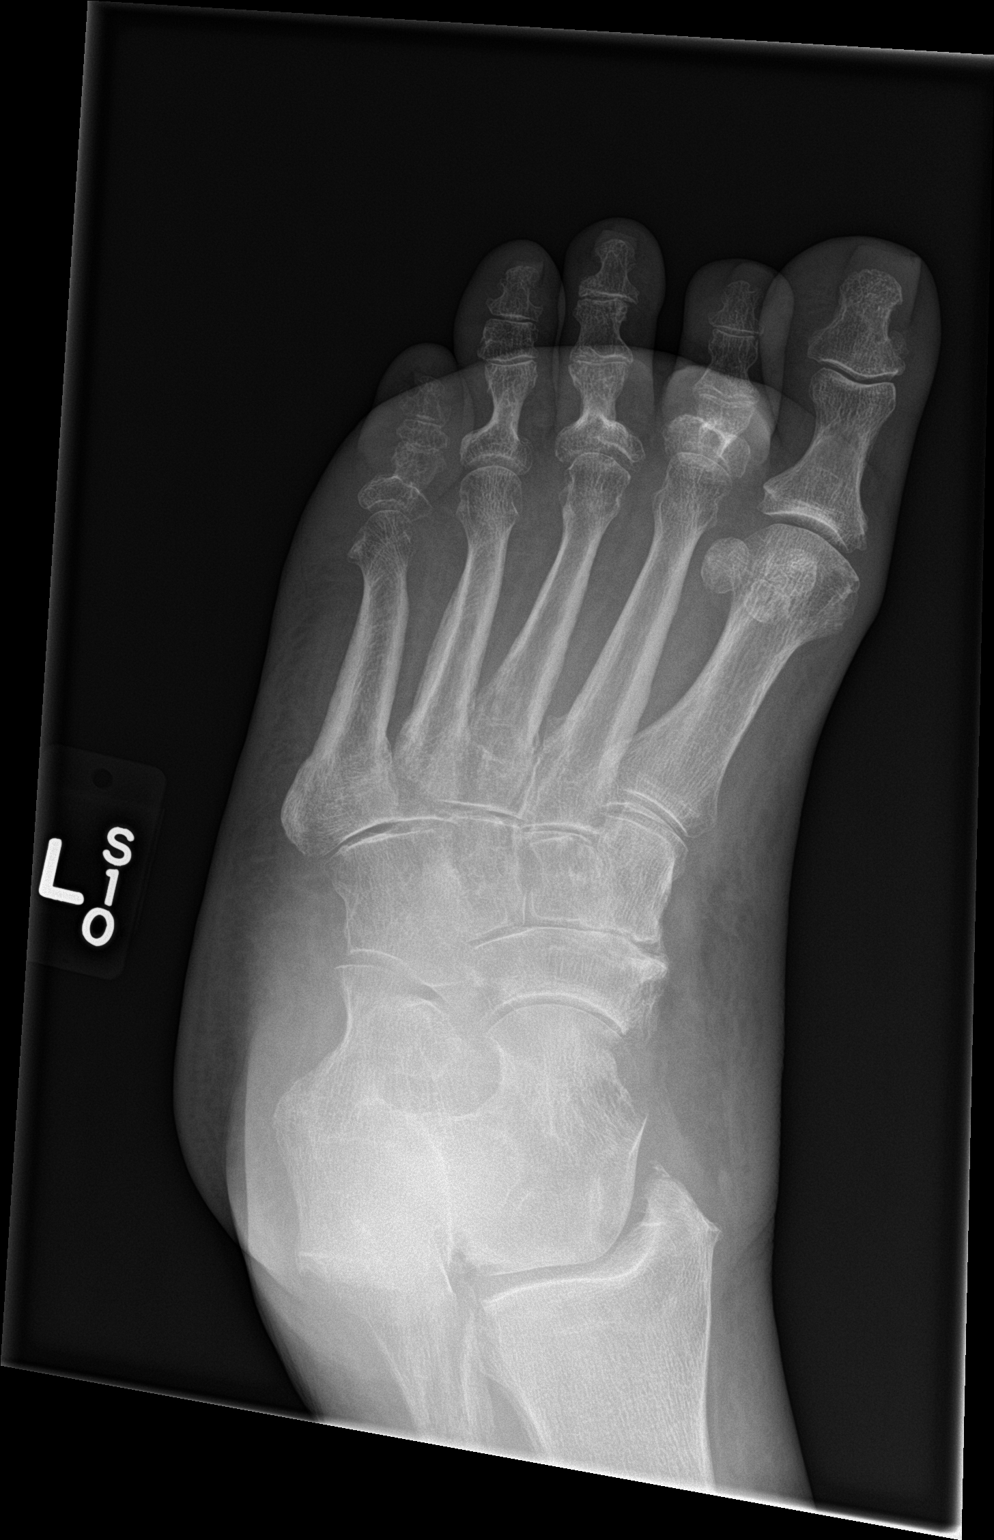

[foot lat]
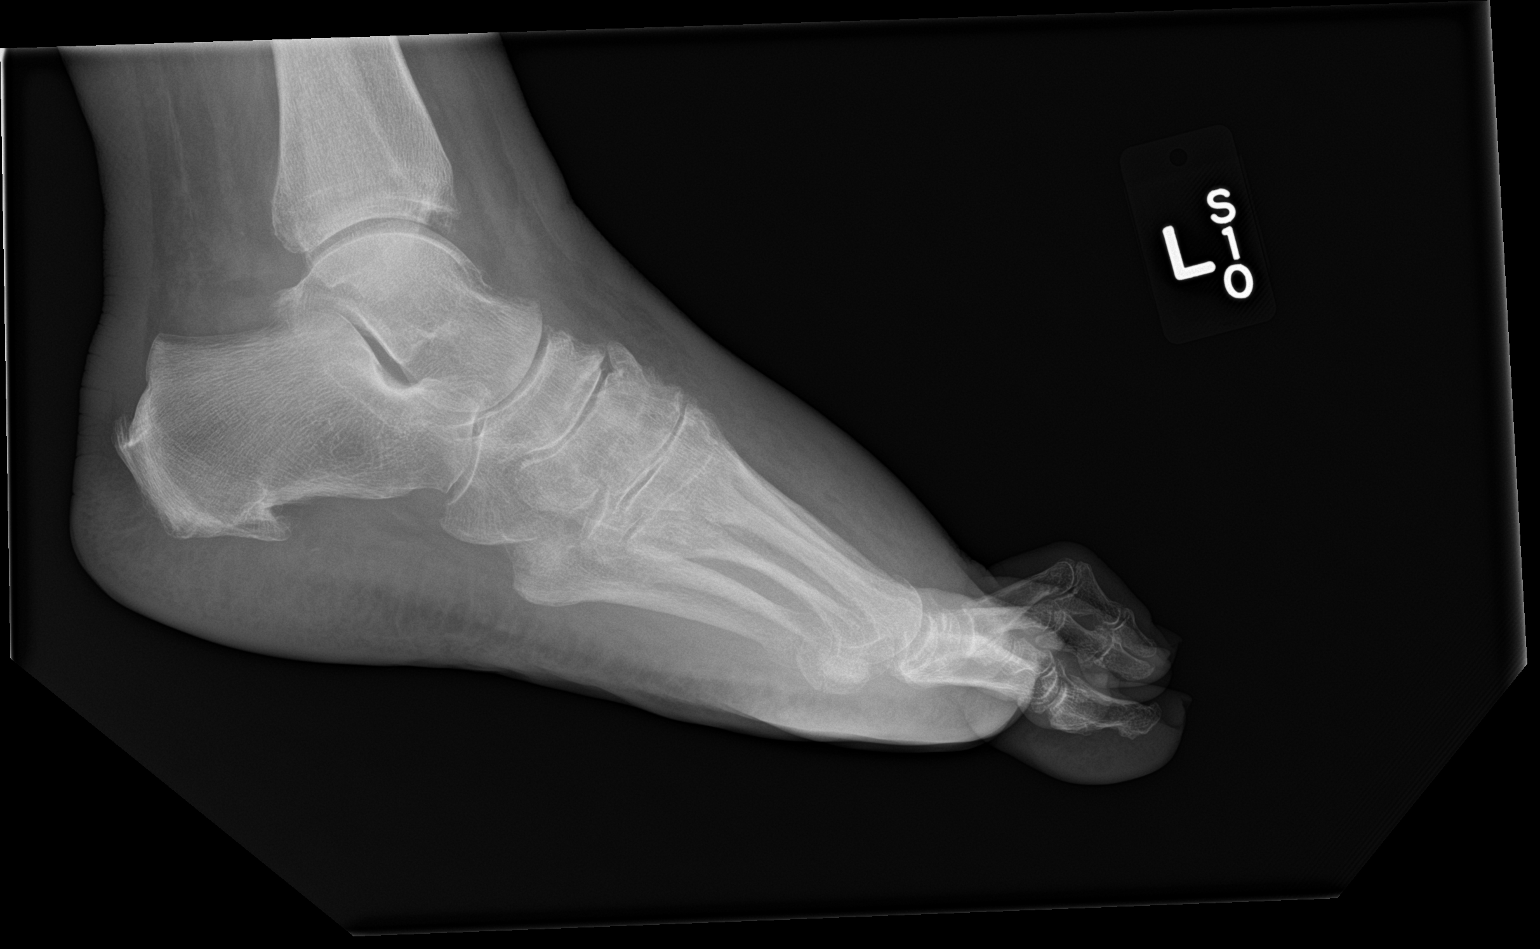

[3 of 3 positions shown; findings below may reference images not displayed]

FINDINGS: No acute fracture. No dislocation. Degenerative changes in the
metatarsophalangeal joints and IP joints are noted. Spurring at the
inferior and posterior calcaneus. Soft tissue swelling over the
forefoot is noted.
IMPRESSION: No acute bony pathology.  Chronic changes.

## 2016-05-01 ENCOUNTER — Ambulatory Visit: Payer: Medicare HMO | Admitting: *Deleted

## 2016-05-01 DIAGNOSIS — G4733 Obstructive sleep apnea (adult) (pediatric): Secondary | ICD-10-CM | POA: Diagnosis not present

## 2016-05-01 DIAGNOSIS — J4530 Mild persistent asthma, uncomplicated: Secondary | ICD-10-CM | POA: Diagnosis not present

## 2016-05-02 ENCOUNTER — Ambulatory Visit: Payer: Commercial Managed Care - HMO | Admitting: Nurse Practitioner

## 2016-05-06 ENCOUNTER — Ambulatory Visit
Admission: RE | Admit: 2016-05-06 | Discharge: 2016-05-06 | Disposition: A | Discharge status: Home / Self Care | Payer: Medicare HMO | Source: Ambulatory Visit | Attending: Family Medicine | Admitting: Family Medicine

## 2016-05-06 ENCOUNTER — Other Ambulatory Visit: Payer: Self-pay | Admitting: Family Medicine

## 2016-05-06 ENCOUNTER — Encounter: Payer: Self-pay | Admitting: Nurse Practitioner

## 2016-05-06 DIAGNOSIS — J309 Allergic rhinitis, unspecified: Secondary | ICD-10-CM | POA: Diagnosis not present

## 2016-05-06 DIAGNOSIS — R079 Chest pain, unspecified: Secondary | ICD-10-CM

## 2016-05-06 DIAGNOSIS — R0781 Pleurodynia: Secondary | ICD-10-CM | POA: Diagnosis not present

## 2016-05-06 DIAGNOSIS — S299XXA Unspecified injury of thorax, initial encounter: Secondary | ICD-10-CM | POA: Diagnosis not present

## 2016-05-07 DIAGNOSIS — J4520 Mild intermittent asthma, uncomplicated: Secondary | ICD-10-CM | POA: Diagnosis not present

## 2016-05-07 DIAGNOSIS — N183 Chronic kidney disease, stage 3 (moderate): Secondary | ICD-10-CM | POA: Diagnosis not present

## 2016-05-07 DIAGNOSIS — I10 Essential (primary) hypertension: Secondary | ICD-10-CM | POA: Diagnosis not present

## 2016-05-07 DIAGNOSIS — E1122 Type 2 diabetes mellitus with diabetic chronic kidney disease: Secondary | ICD-10-CM | POA: Diagnosis not present

## 2016-05-07 DIAGNOSIS — I5032 Chronic diastolic (congestive) heart failure: Secondary | ICD-10-CM | POA: Diagnosis not present

## 2016-05-07 DIAGNOSIS — E119 Type 2 diabetes mellitus without complications: Secondary | ICD-10-CM | POA: Diagnosis not present

## 2016-05-07 DIAGNOSIS — I5033 Acute on chronic diastolic (congestive) heart failure: Secondary | ICD-10-CM | POA: Diagnosis not present

## 2016-05-07 DIAGNOSIS — I2510 Atherosclerotic heart disease of native coronary artery without angina pectoris: Secondary | ICD-10-CM | POA: Diagnosis not present

## 2016-05-09 ENCOUNTER — Ambulatory Visit (HOSPITAL_COMMUNITY)
Admission: RE | Admit: 2016-05-09 | Discharge: 2016-05-09 | Disposition: A | Discharge status: Home / Self Care | Payer: Medicare HMO | Source: Ambulatory Visit | Attending: Cardiology | Admitting: Cardiology

## 2016-05-09 VITALS — BP 128/70 | HR 87 | Wt 247.8 lb

## 2016-05-09 DIAGNOSIS — N183 Chronic kidney disease, stage 3 unspecified: Secondary | ICD-10-CM

## 2016-05-09 DIAGNOSIS — J45909 Unspecified asthma, uncomplicated: Secondary | ICD-10-CM | POA: Insufficient documentation

## 2016-05-09 DIAGNOSIS — I255 Ischemic cardiomyopathy: Secondary | ICD-10-CM | POA: Insufficient documentation

## 2016-05-09 DIAGNOSIS — I5032 Chronic diastolic (congestive) heart failure: Secondary | ICD-10-CM | POA: Diagnosis not present

## 2016-05-09 DIAGNOSIS — I2510 Atherosclerotic heart disease of native coronary artery without angina pectoris: Secondary | ICD-10-CM | POA: Insufficient documentation

## 2016-05-09 DIAGNOSIS — Z7982 Long term (current) use of aspirin: Secondary | ICD-10-CM | POA: Diagnosis not present

## 2016-05-09 DIAGNOSIS — K219 Gastro-esophageal reflux disease without esophagitis: Secondary | ICD-10-CM | POA: Insufficient documentation

## 2016-05-09 DIAGNOSIS — I130 Hypertensive heart and chronic kidney disease with heart failure and stage 1 through stage 4 chronic kidney disease, or unspecified chronic kidney disease: Secondary | ICD-10-CM | POA: Diagnosis not present

## 2016-05-09 DIAGNOSIS — Z951 Presence of aortocoronary bypass graft: Secondary | ICD-10-CM | POA: Insufficient documentation

## 2016-05-09 DIAGNOSIS — M1990 Unspecified osteoarthritis, unspecified site: Secondary | ICD-10-CM | POA: Diagnosis not present

## 2016-05-09 DIAGNOSIS — Z87891 Personal history of nicotine dependence: Secondary | ICD-10-CM | POA: Insufficient documentation

## 2016-05-09 DIAGNOSIS — G4733 Obstructive sleep apnea (adult) (pediatric): Secondary | ICD-10-CM

## 2016-05-09 DIAGNOSIS — K589 Irritable bowel syndrome without diarrhea: Secondary | ICD-10-CM | POA: Diagnosis not present

## 2016-05-09 DIAGNOSIS — I428 Other cardiomyopathies: Secondary | ICD-10-CM | POA: Diagnosis not present

## 2016-05-09 DIAGNOSIS — K648 Other hemorrhoids: Secondary | ICD-10-CM | POA: Insufficient documentation

## 2016-05-09 DIAGNOSIS — Z95810 Presence of automatic (implantable) cardiac defibrillator: Secondary | ICD-10-CM | POA: Diagnosis not present

## 2016-05-09 DIAGNOSIS — K760 Fatty (change of) liver, not elsewhere classified: Secondary | ICD-10-CM | POA: Insufficient documentation

## 2016-05-09 LAB — BASIC METABOLIC PANEL
Anion gap: 11 (ref 5–15)
BUN: 20 mg/dL (ref 6–20)
CO2: 29 mmol/L (ref 22–32)
Calcium: 9.4 mg/dL (ref 8.9–10.3)
Chloride: 102 mmol/L (ref 101–111)
Creatinine, Ser: 1.27 mg/dL — ABNORMAL HIGH (ref 0.44–1.00)
GFR calc Af Amer: 48 mL/min — ABNORMAL LOW (ref >60)
GFR calc non Af Amer: 41 mL/min — ABNORMAL LOW (ref >60)
Glucose, Bld: 120 mg/dL — ABNORMAL HIGH (ref 65–99)
Potassium: 3.7 mmol/L (ref 3.5–5.1)
Sodium: 142 mmol/L (ref 135–145)

## 2016-05-09 LAB — BRAIN NATRIURETIC PEPTIDE: B Natriuretic Peptide: 103.4 pg/mL — ABNORMAL HIGH (ref 0.0–100.0)

## 2016-05-09 NOTE — Progress Notes (Signed)
ADVANCED HF CLINIC NOTE  Patient ID: [name], female   DOB: 01/17/1946, 71 y.o.   MRN: [id number]  Primary Cardiologist: Dr. Dani Gobble Croitoru Primary HF: Dr. Haroldine Laws   HPI: [name] is a 71 year old with history of morbid obesity, DM, HTN, asthma, OSA, ICD, GERD, arthritis, CRT-D 2008 [employer], CAD, S/P CABG x5 11/29/2014.  Discharged from [facility] 12/07/14 s/p CABG. Discharge weight 252.  Went to [facility].  Readmitted 12/21/14 with RLE cellulitis and volume overload with weight of 273 and complaints of dyspnea and orthopnea. RLE cellulitis treated with IV ABX. She was had brisk diuresis with lasix gtt. Diuresis slowed down with slight Cr bump. Acetazolamide added and monitored closely with history of sulfa allergy. Diuresed well, but lasix held with continued Cr bump. Transitioned to po torsemide for d/c. Discharge weight 246 lbs.  Admitted 1/23 through 03/09/16 with CP. CEs negative. She did not require a heart craterization.   Today she returns for HF follow up. Complains of fatigue. Not weighing at home. SOB with exertion. Denies PND/CP. Pulled a muscle in her back moving. Saw PCP and she was placed on muscle relaxer. Plans to start Bipap but she hasnt received BiPap. Eating high salt foods.  Taking all medications. Lives with her son.     Echo 11/24/14 EF 55-60%, Grade 1 DD. Mild TR TEE 11/29/14 EF 55-60%, LVH, Mild AR and MR LHC 11/2014 Prox RCA lesion, 85% stenosed.  Mid RCA lesion, 90% stenosed.  1st Mrg lesion, 85% stenosed.  Dist Cx lesion, 60% stenosed.  Ost 1st Diag lesion, 70% stenosed.  1st Diag lesion, 70% stenosed.  Ost 2nd Diag to 2nd Diag lesion, 70% stenosed.  Mid LAD to Dist LAD lesion, 50% stenosed.  Labs 02/21/2015: K 4.5 Creatinine 1.39  Labs 07/12/2015: K 4.4 Creatinine 1.14 Hgb 11.5   Past Medical History:  Diagnosis Date  . Anemia   . Arthritis   . Asthma   . CAD (coronary artery disease)   . Cellulitis and abscess of foot  12/2014   RT FOOT  . CHF (congestive heart failure) ([place])   . Diabetes mellitus without complication ([place])   . Diverticulosis   . Facial numbness 02/2016  . Fatty liver disease, nonalcoholic   . Gastritis   . GERD (gastroesophageal reflux disease)   . H/O hiatal hernia   . Hx of CABG   . Hyperplastic colon polyp   . Hypertension   . IBS (irritable bowel syndrome)   . ICD (implantable cardiac defibrillator) in place   . Internal hemorrhoids   . Ischemic cardiomyopathy   . Obesity   . OSA (obstructive sleep apnea)   . Shortness of breath     Current Outpatient Prescriptions  Medication Sig Dispense Refill  . acetaminophen (TYLENOL) 500 MG tablet Take 1,000 mg by mouth every 6 (six) hours as needed (for pain.).    [name] albuterol (PROVENTIL HFA;VENTOLIN HFA) 108 (90 Base) MCG/ACT inhaler Inhale 2 puffs into the lungs every 4 (four) hours as needed for wheezing (wheezing). 6.7 g 3  . aspirin EC 81 MG tablet Take 1 tablet (81 mg total) by mouth daily. 90 tablet 3  . carvedilol (COREG) 6.25 MG tablet Take 1 tablet (6.25 mg total) by mouth 2 (two) times daily with a meal. 180 tablet 3  . cetirizine (ZYRTEC) 10 MG tablet Take 10 mg by mouth daily.     . clopidogrel (PLAVIX) 75 MG tablet Take 1 tablet (75 mg total) by mouth  daily. 90 tablet 3  . clotrimazole (MYCELEX) 10 MG troche Take 10 mg by mouth every 4 (four) hours as needed (mouth pain).    . colchicine 0.6 MG tablet Take 0.6 mg by mouth 2 (two) times daily.    [name] EPINEPHrine 0.3 mg/0.3 mL IJ SOAJ injection Inject 0.3 mg into the muscle daily as needed (allergic reaction).     . fluorometholone (FML) 0.1 % ophthalmic suspension Place 1 drop into both eyes 2 (two) times daily.    . fluticasone (FLONASE) 50 MCG/ACT nasal spray Place 1 spray into the nose 2 (two) times daily as needed for rhinitis or allergies.     . isosorbide mononitrate (IMDUR) 30 MG 24 hr tablet Take 1 tablet (30 mg total) by mouth daily. 30 tablet 0  . omeprazole  (PRILOSEC) 40 MG capsule Take 1 capsule (40 mg total) by mouth 2 (two) times daily before a meal. 60 capsule 11  . potassium chloride SA (KLOR-CON M20) 20 MEQ tablet Take 1 tablet (20 mEq total) by mouth daily. 90 tablet 3  . spironolactone (ALDACTONE) 25 MG tablet Take 1 tablet (25 mg total) by mouth daily. 90 tablet 3  . SYMBICORT 160-4.5 MCG/ACT inhaler Inhale 2 puffs into the lungs 2 (two) times daily.    [name] torsemide (DEMADEX) 20 MG tablet Take 2 tablets (40 mg total) by mouth 2 (two) times daily. 180 tablet 3  . triamcinolone cream (KENALOG) 0.1 % Apply 1 application topically daily as needed (applied to affected itchy area(s)).     . rosuvastatin (CRESTOR) 20 MG tablet Take 1 tablet (20 mg total) by mouth daily. 90 tablet 3   No current facility-administered medications for this encounter.     Allergies  Allergen Reactions  . Ivp Dye [Iodinated Diagnostic Agents] Shortness Of Breath    No reaction to PO contrast with non-ionic dye.06-25-2014/rsm  . Shellfish-Derived Products Anaphylaxis  . Sulfa Antibiotics Shortness Of Breath  . Uloric [Febuxostat] Nausea And Vomiting  . Iodine Hives  . Atorvastatin Other (See Comments)    Pt states "causes bilateral leg pain/cramps."  . Cephalexin Itching and Rash    Rash & itch after Keflex  . Zithromax [Azithromycin] Rash      Social History   Social History  . Marital status: Widowed    Spouse name: N/A  . Number of children: 5  . Years of education: N/A   Occupational History  . 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 Smoking status: Former Smoker    Packs/day: 0.25    Years: 3.00    Types: Cigarettes    Quit date: 02/11/1968  . Smokeless tobacco: Never Used  . Alcohol use No  . Drug use: No  . Sexual activity: No   Other Topics Concern  . Not on file   Social History Narrative   Widowed last year. She lives with her two sons.      Family History  Problem Relation Age of Onset  . Breast  cancer Mother   . Diabetes Mother   . Heart disease Maternal Grandmother   . Kidney disease Maternal Grandmother   . Diabetes Maternal Grandmother   . Glaucoma Maternal Aunt   . Heart disease Maternal Aunt   . Asthma Sister     Vitals:   05/09/16 1033  BP: 128/70  Pulse: 87  SpO2: 95%  Weight: 247 lb 12.8 oz (112.4 kg)   Chest Circumfrence 144 cm.   PHYSICAL EXAM:  General:   NAD. Ambulated in the clinic today.   HEENT: normal Neck: supple.  JVD difficult to assess due to body habitus. Carotids 2+ bilat; no bruits. No thyromegaly or nodule noted. Cor: PMI nondisplaced. RRR. No M/G/R. Sternum stable.  Lungs: Clear  Abdomen: Obese, soft, NT, ND. No HSM. No bruits or masses. +BS Extremities: no cyanosis or clubbing. RLE and LLE 1-2+ edema.   Neuro: alert & oriented x 3, cranial nerves grossly intact. moves all 4 extremities w/o difficulty. Affect pleasant.   ASSESSMENT & PLAN:  1. Chronic diastolic HF - TEE 82/42/35 EF 55-60%, mild LVH.  NYHA III. Volume status elevated in the setting of high salt diet.  Increase torsemide to 40 mg twice a day for 2 days then back to 40 mg daily.  She would benefit from cardiomems. Discussed cardiomems today and provided with bookltet.  Do the following things EVERYDAY: 1) Weigh yourself in the morning before breakfast. Write it down and keep it in a log. 2) Take your medicines as prescribed 3) Eat low salt foods-Limit salt (sodium) to 2000 mg per day.  4) Stay as active as you can everyday 5) Limit all fluids for the day to less than 2 liters  2. CAD s/p CABG 11/2014 On statin, bb, aspirin. No chest pain. Managed by her Cardiologist.  3. Morbid obesity- needs to lose weight. Discussed portion control.  4. H/o heart block s/p BiV pacer/CRT-D [employer] - with malfunction of A lead and chronic penetration of V lead into pericardium.  6. Chronic renal failure stage III- BMET today.  7. Cellulitis, left lower extremity - resolved.  8.  OSA: Needs to start BiPap. I asked her to follow up with Dr Elsworth Soho.    Follow up in 4 weeks. Try to obtain cardiomems approval.   Amy Clegg,NP-C  10:36 AM

## 2016-05-09 NOTE — Patient Instructions (Signed)
Take Torsemide 40 mg (2 Tablets) Twice daily for 2 DAYS ONLY.  Then resume previous dose.  Follow up in 1 Month

## 2016-05-21 ENCOUNTER — Ambulatory Visit: Payer: Medicare HMO | Admitting: Pulmonary Disease

## 2016-06-01 DIAGNOSIS — G4733 Obstructive sleep apnea (adult) (pediatric): Secondary | ICD-10-CM | POA: Diagnosis not present

## 2016-06-01 DIAGNOSIS — J4530 Mild persistent asthma, uncomplicated: Secondary | ICD-10-CM | POA: Diagnosis not present

## 2016-06-04 ENCOUNTER — Telehealth: Payer: Self-pay | Admitting: Cardiology

## 2016-06-04 ENCOUNTER — Encounter: Payer: Medicare HMO | Admitting: *Deleted

## 2016-06-04 NOTE — Telephone Encounter (Signed)
Spoke with pt and reminded pt of remote transmission that is due today. Pt verbalized understanding and stated that she just moved and she hasn't unpacked it yet. Once she unpacks it she will hook it up and send the transmission.

## 2016-06-06 ENCOUNTER — Encounter: Payer: Self-pay | Admitting: Cardiology

## 2016-06-09 ENCOUNTER — Ambulatory Visit (HOSPITAL_COMMUNITY)
Admission: RE | Admit: 2016-06-09 | Discharge: 2016-06-09 | Disposition: A | Discharge status: Home / Self Care | Payer: Medicare HMO | Source: Ambulatory Visit | Attending: Cardiology | Admitting: Cardiology

## 2016-06-09 ENCOUNTER — Encounter (HOSPITAL_COMMUNITY): Payer: Self-pay

## 2016-06-09 VITALS — BP 122/86 | HR 72 | Wt 249.4 lb

## 2016-06-09 DIAGNOSIS — I2510 Atherosclerotic heart disease of native coronary artery without angina pectoris: Secondary | ICD-10-CM | POA: Insufficient documentation

## 2016-06-09 DIAGNOSIS — Z95810 Presence of automatic (implantable) cardiac defibrillator: Secondary | ICD-10-CM | POA: Diagnosis not present

## 2016-06-09 DIAGNOSIS — I130 Hypertensive heart and chronic kidney disease with heart failure and stage 1 through stage 4 chronic kidney disease, or unspecified chronic kidney disease: Secondary | ICD-10-CM | POA: Diagnosis not present

## 2016-06-09 DIAGNOSIS — J45909 Unspecified asthma, uncomplicated: Secondary | ICD-10-CM | POA: Insufficient documentation

## 2016-06-09 DIAGNOSIS — Z91041 Radiographic dye allergy status: Secondary | ICD-10-CM | POA: Insufficient documentation

## 2016-06-09 DIAGNOSIS — Z91013 Allergy to seafood: Secondary | ICD-10-CM | POA: Diagnosis not present

## 2016-06-09 DIAGNOSIS — Z79899 Other long term (current) drug therapy: Secondary | ICD-10-CM | POA: Insufficient documentation

## 2016-06-09 DIAGNOSIS — K589 Irritable bowel syndrome without diarrhea: Secondary | ICD-10-CM | POA: Diagnosis not present

## 2016-06-09 DIAGNOSIS — Z7982 Long term (current) use of aspirin: Secondary | ICD-10-CM | POA: Insufficient documentation

## 2016-06-09 DIAGNOSIS — Z87891 Personal history of nicotine dependence: Secondary | ICD-10-CM | POA: Insufficient documentation

## 2016-06-09 DIAGNOSIS — I442 Atrioventricular block, complete: Secondary | ICD-10-CM | POA: Diagnosis not present

## 2016-06-09 DIAGNOSIS — Z825 Family history of asthma and other chronic lower respiratory diseases: Secondary | ICD-10-CM | POA: Insufficient documentation

## 2016-06-09 DIAGNOSIS — I5032 Chronic diastolic (congestive) heart failure: Secondary | ICD-10-CM | POA: Insufficient documentation

## 2016-06-09 DIAGNOSIS — Z6841 Body Mass Index (BMI) 40.0 and over, adult: Secondary | ICD-10-CM | POA: Diagnosis not present

## 2016-06-09 DIAGNOSIS — Z833 Family history of diabetes mellitus: Secondary | ICD-10-CM | POA: Insufficient documentation

## 2016-06-09 DIAGNOSIS — Z803 Family history of malignant neoplasm of breast: Secondary | ICD-10-CM | POA: Insufficient documentation

## 2016-06-09 DIAGNOSIS — G4733 Obstructive sleep apnea (adult) (pediatric): Secondary | ICD-10-CM | POA: Diagnosis not present

## 2016-06-09 DIAGNOSIS — K760 Fatty (change of) liver, not elsewhere classified: Secondary | ICD-10-CM | POA: Insufficient documentation

## 2016-06-09 DIAGNOSIS — Z882 Allergy status to sulfonamides status: Secondary | ICD-10-CM | POA: Diagnosis not present

## 2016-06-09 DIAGNOSIS — Z881 Allergy status to other antibiotic agents status: Secondary | ICD-10-CM | POA: Insufficient documentation

## 2016-06-09 DIAGNOSIS — Z951 Presence of aortocoronary bypass graft: Secondary | ICD-10-CM | POA: Diagnosis not present

## 2016-06-09 DIAGNOSIS — Z888 Allergy status to other drugs, medicaments and biological substances status: Secondary | ICD-10-CM | POA: Insufficient documentation

## 2016-06-09 DIAGNOSIS — N183 Chronic kidney disease, stage 3 unspecified: Secondary | ICD-10-CM

## 2016-06-09 DIAGNOSIS — Z7951 Long term (current) use of inhaled steroids: Secondary | ICD-10-CM | POA: Insufficient documentation

## 2016-06-09 DIAGNOSIS — K219 Gastro-esophageal reflux disease without esophagitis: Secondary | ICD-10-CM | POA: Diagnosis not present

## 2016-06-09 DIAGNOSIS — M1990 Unspecified osteoarthritis, unspecified site: Secondary | ICD-10-CM | POA: Insufficient documentation

## 2016-06-09 LAB — BASIC METABOLIC PANEL
Anion gap: 5 (ref 5–15)
BUN: 27 mg/dL — ABNORMAL HIGH (ref 6–20)
CO2: 33 mmol/L — ABNORMAL HIGH (ref 22–32)
Calcium: 9.1 mg/dL (ref 8.9–10.3)
Chloride: 102 mmol/L (ref 101–111)
Creatinine, Ser: 1.32 mg/dL — ABNORMAL HIGH (ref 0.44–1.00)
GFR calc Af Amer: 45 mL/min — ABNORMAL LOW (ref >60)
GFR calc non Af Amer: 39 mL/min — ABNORMAL LOW (ref >60)
Glucose, Bld: 152 mg/dL — ABNORMAL HIGH (ref 65–99)
Potassium: 3.9 mmol/L (ref 3.5–5.1)
Sodium: 140 mmol/L (ref 135–145)

## 2016-06-09 MED ORDER — TORSEMIDE 20 MG PO TABS: ORAL_TABLET | ORAL | 6 refills | Status: DC

## 2016-06-09 NOTE — Progress Notes (Signed)
ADVANCED HF CLINIC NOTE  Patient ID: [name], female   DOB: 1945-07-01, 71 y.o.   MRN: [id number]  Primary Cardiologist: Dr. Dani Gobble Croitoru Primary HF: Dr. Haroldine Laws   HPI: [name] is a 71 year old with history of morbid obesity, DM, HTN, asthma, OSA, ICD, GERD, arthritis, CRT-D 2008 [employer], CAD, S/P CABG x5 11/29/2014.  Discharged from [facility] 12/07/14 s/p CABG. Discharge weight 252.  Went to [facility].  Readmitted 12/21/14 with RLE cellulitis and volume overload with weight of 273 and complaints of dyspnea and orthopnea. RLE cellulitis treated with IV ABX. She was had brisk diuresis with lasix gtt. Diuresis slowed down with slight Cr bump. Acetazolamide added and monitored closely with history of sulfa allergy. Diuresed well, but lasix held with continued Cr bump. Transitioned to po torsemide for d/c. Discharge weight 246 lbs.  Admitted 1/23 through 03/09/16 with CP. CEs negative. She did not require a heart craterization.   She presents today for regular follow up.  Continues to have DOE.  Has to take breaks walking a block. Occasionally gets SOB changing clothes or bathing. Back pain has improved.  She has BiPAP at home but tolerates it poorly. States she gets claustrophobic. Remains fatigued. Gets really sleep after she eats. Continues to eat high salt foods. Taking all medications. Taking torsemide 40 mg daily. Takes extra 20 mg a couple of times a week. Remains orthopneic. Sleeps on 2 pillows chronically and on her side.     Echo 11/24/14 EF 55-60%, Grade 1 DD. Mild TR TEE 11/29/14 EF 55-60%, LVH, Mild AR and MR LHC 11/2014 Prox RCA lesion, 85% stenosed.  Mid RCA lesion, 90% stenosed.  1st Mrg lesion, 85% stenosed.  Dist Cx lesion, 60% stenosed.  Ost 1st Diag lesion, 70% stenosed.  1st Diag lesion, 70% stenosed.  Ost 2nd Diag to 2nd Diag lesion, 70% stenosed.  Mid LAD to Dist LAD lesion, 50% stenosed.  Labs 02/21/2015: K 4.5 Creatinine 1.39  Labs  07/12/2015: K 4.4 Creatinine 1.14 Hgb 11.5   Past Medical History:  Diagnosis Date  . Anemia   . Arthritis   . Asthma   . CAD (coronary artery disease)   . Cellulitis and abscess of foot 12/2014   RT FOOT  . CHF (congestive heart failure) ([place])   . Diabetes mellitus without complication ([place])   . Diverticulosis   . Facial numbness 02/2016  . Fatty liver disease, nonalcoholic   . Gastritis   . GERD (gastroesophageal reflux disease)   . H/O hiatal hernia   . Hx of CABG   . Hyperplastic colon polyp   . Hypertension   . IBS (irritable bowel syndrome)   . ICD (implantable cardiac defibrillator) in place   . Internal hemorrhoids   . Ischemic cardiomyopathy   . Obesity   . OSA (obstructive sleep apnea)   . Shortness of breath     Current Outpatient Prescriptions  Medication Sig Dispense Refill  . acetaminophen (TYLENOL) 500 MG tablet Take 1,000 mg by mouth every 6 (six) hours as needed (for pain.).    [name] albuterol (PROVENTIL HFA;VENTOLIN HFA) 108 (90 Base) MCG/ACT inhaler Inhale 2 puffs into the lungs every 4 (four) hours as needed for wheezing (wheezing). 6.7 g 3  . aspirin EC 81 MG tablet Take 1 tablet (81 mg total) by mouth daily. 90 tablet 3  . carvedilol (COREG) 6.25 MG tablet Take 1 tablet (6.25 mg total) by mouth 2 (two) times daily with a meal. 180 tablet  3  . cetirizine (ZYRTEC) 10 MG tablet Take 10 mg by mouth daily.     . clopidogrel (PLAVIX) 75 MG tablet Take 1 tablet (75 mg total) by mouth daily. 90 tablet 3  . clotrimazole (MYCELEX) 10 MG troche Take 10 mg by mouth every 4 (four) hours as needed (mouth pain).    . colchicine 0.6 MG tablet Take 0.6 mg by mouth 2 (two) times daily.    [name] EPINEPHrine 0.3 mg/0.3 mL IJ SOAJ injection Inject 0.3 mg into the muscle daily as needed (allergic reaction).     . fluorometholone (FML) 0.1 % ophthalmic suspension Place 1 drop into both eyes 2 (two) times daily.    . fluticasone (FLONASE) 50 MCG/ACT nasal spray Place 1 spray into the  nose 2 (two) times daily as needed for rhinitis or allergies.     . isosorbide mononitrate (IMDUR) 30 MG 24 hr tablet Take 1 tablet (30 mg total) by mouth daily. 30 tablet 0  . omeprazole (PRILOSEC) 40 MG capsule Take 1 capsule (40 mg total) by mouth 2 (two) times daily before a meal. 60 capsule 11  . potassium chloride SA (KLOR-CON M20) 20 MEQ tablet Take 1 tablet (20 mEq total) by mouth daily. 90 tablet 3  . rosuvastatin (CRESTOR) 20 MG tablet Take 1 tablet (20 mg total) by mouth daily. 90 tablet 3  . spironolactone (ALDACTONE) 25 MG tablet Take 1 tablet (25 mg total) by mouth daily. 90 tablet 3  . SYMBICORT 160-4.5 MCG/ACT inhaler Inhale 2 puffs into the lungs 2 (two) times daily.    [name] torsemide (DEMADEX) 20 MG tablet Take 2 tablets (40 mg total) by mouth 2 (two) times daily. 180 tablet 3  . triamcinolone cream (KENALOG) 0.1 % Apply 1 application topically daily as needed (applied to affected itchy area(s)).      No current facility-administered medications for this encounter.     Allergies  Allergen Reactions  . Ivp Dye [Iodinated Diagnostic Agents] Shortness Of Breath    No reaction to PO contrast with non-ionic dye.06-25-2014/rsm  . Shellfish-Derived Products Anaphylaxis  . Sulfa Antibiotics Shortness Of Breath  . Uloric [Febuxostat] Nausea And Vomiting  . Iodine Hives  . Atorvastatin Other (See Comments)    Pt states "causes bilateral leg pain/cramps."  . Cephalexin Itching and Rash    Rash & itch after Keflex  . Zithromax [Azithromycin] Rash      Social History   Social History  . Marital status: Widowed    Spouse name: N/A  . Number of children: 5  . Years of education: N/A   Occupational History  . 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 Smoking status: Former Smoker    Packs/day: 0.25    Years: 3.00    Types: Cigarettes    Quit date: 02/11/1968  . Smokeless tobacco: Never Used  . Alcohol use No  . Drug use: No  . Sexual activity:  No   Other Topics Concern  . Not on file   Social History Narrative   Widowed last year. She lives with her two sons.      Family History  Problem Relation Age of Onset  . Breast cancer Mother   . Diabetes Mother   . Heart disease Maternal Grandmother   . Kidney disease Maternal Grandmother   . Diabetes Maternal Grandmother   . Glaucoma Maternal Aunt   . Heart disease Maternal Aunt   . Asthma Sister  Vitals:   06/09/16 1116  BP: 122/86  Pulse: 72  SpO2: 98%  Weight: 249 lb 6.4 oz (113.1 kg)   Wt Readings from Last 3 Encounters:  06/09/16 249 lb 6.4 oz (113.1 kg)  05/09/16 247 lb 12.8 oz (112.4 kg)  04/03/16 247 lb (112 kg)    Chest Circumfrence 144 cm.   PHYSICAL EXAM: General: Obese. Walked into clinic without difficulty.  HEENT: normal Neck: supple. JVD difficult due to body habitus. Carotids 2+ bilat; no bruits. No thyromegaly or nodule noted. Cor: PMI nondisplaced. RRR, No M/G/R noted Lungs: CTAB, normal effort. Abdomen: soft, non-tender, distended, no HSM. No bruits or masses. +BS  Extremities: no cyanosis, clubbing, rash, 1+ ankle edema.  Neuro: alert & orientedx3, cranial nerves grossly intact. moves all 4 extremities w/o difficulty. Affect pleasant      ASSESSMENT & PLAN:  1. Chronic diastolic HF - TEE 04/24/92 EF 55-60%, mild LVH.  - NYHA III.  - Volume status elevated in setting of high sodium diet and drinking > 2 L daily - Increase torsemide 60 mg alternating with 40 mg daily. May need to increase more, but this is as much as pt is willing to increase today. (Initial suggestion 60 mg daily)  - She would benefit from cardiomems. Awaiting insurance approval for consideration.  - Reinforced fluid restriction to < 2 L daily, sodium restriction to less than 2000 mg daily, and the importance of daily weights.    2. CAD s/p CABG 11/2014 On statin, bb, aspirin.  - No chest pain. Per primary cards.  3. Morbid obesity - Discussed need to lose weight  and low carb diet such as [facility].  4. H/o heart block s/p BiV pacer/CRT-D [employer] - with malfunction of A lead and chronic penetration of V lead into pericardium.  - Stable. No change.  5. Chronic renal failure stage III - BMET today.  6. OSA:  - Got BiPAP Needs to start BiPap. I asked her to follow up with Dr Elsworth Soho.    Awaiting insurance for possible cardiomem.  Labs and meds as below. Extensively counseled on fluid and salt restriction. Follow up 6-8 weeks.   Shirley Friar, PA-C   11:22 AM   Greater than 50% of the 25 minute visit was spent in counseling/coordination of care regarding disease state education, fluid and salt restriction, Cardiomems consideration, and sliding scale diuretics.

## 2016-06-09 NOTE — Patient Instructions (Signed)
Routine lab work today. Will notify you of abnormal results, otherwise no news is good news!  CHANGE Torsemide to 40 mg (3 tabs) once every other day alternating with 60 mg (3 tabs) every other day.  Follow up 6-8 weeks with Dr. Haroldine Laws.  Do the following things EVERYDAY: 1) Weigh yourself in the morning before breakfast. Write it down and keep it in a log. 2) Take your medicines as prescribed 3) Eat low salt foods-Limit salt (sodium) to 2000 mg per day.  4) Stay as active as you can everyday 5) Limit all fluids for the day to less than 2 liters

## 2016-06-09 NOTE — Addendum Note (Signed)
Encounter addended by: Shirley Friar, PA-C on: 06/09/2016 12:19 PM<BR>    Actions taken: LOS modified, Follow-up modified

## 2016-06-16 ENCOUNTER — Encounter: Payer: Medicare HMO | Attending: Family Medicine | Admitting: Dietician

## 2016-06-16 ENCOUNTER — Encounter: Payer: Self-pay | Admitting: Dietician

## 2016-06-16 DIAGNOSIS — E1122 Type 2 diabetes mellitus with diabetic chronic kidney disease: Secondary | ICD-10-CM | POA: Diagnosis not present

## 2016-06-16 DIAGNOSIS — Z951 Presence of aortocoronary bypass graft: Secondary | ICD-10-CM | POA: Insufficient documentation

## 2016-06-16 DIAGNOSIS — Z6841 Body Mass Index (BMI) 40.0 and over, adult: Secondary | ICD-10-CM | POA: Diagnosis not present

## 2016-06-16 DIAGNOSIS — G4733 Obstructive sleep apnea (adult) (pediatric): Secondary | ICD-10-CM | POA: Insufficient documentation

## 2016-06-16 DIAGNOSIS — J45909 Unspecified asthma, uncomplicated: Secondary | ICD-10-CM | POA: Diagnosis not present

## 2016-06-16 DIAGNOSIS — M109 Gout, unspecified: Secondary | ICD-10-CM | POA: Diagnosis not present

## 2016-06-16 DIAGNOSIS — N189 Chronic kidney disease, unspecified: Secondary | ICD-10-CM | POA: Diagnosis not present

## 2016-06-16 DIAGNOSIS — I509 Heart failure, unspecified: Secondary | ICD-10-CM | POA: Insufficient documentation

## 2016-06-16 DIAGNOSIS — E119 Type 2 diabetes mellitus without complications: Secondary | ICD-10-CM

## 2016-06-16 DIAGNOSIS — K219 Gastro-esophageal reflux disease without esophagitis: Secondary | ICD-10-CM | POA: Insufficient documentation

## 2016-06-16 DIAGNOSIS — Z713 Dietary counseling and surveillance: Secondary | ICD-10-CM | POA: Diagnosis not present

## 2016-06-16 DIAGNOSIS — D649 Anemia, unspecified: Secondary | ICD-10-CM | POA: Insufficient documentation

## 2016-06-16 NOTE — Patient Instructions (Signed)
Fluid limit (based on your report) is 2 liters per day.  This is 8 cups. Continue to avoid added salt.  Buy fresh meat (when possible).  Fresh or frozen vegetables without salt.  Canned vegetables without salt or rinse them off.  Only 1 ounce cheese per day.  Avoid processed meat (look for low sodium). Eat breakfast daily. Limit the portion sizes of sweets.   Bake, broil, boil, or grill rather than fry. Only small amounts of added fat. Stop eating when you are satisfied.

## 2016-06-16 NOTE — Progress Notes (Signed)
Diabetes Self-Management Education  Visit Type: Follow-up  Appt. Start Time: 0915 Appt. End Time: 1030  06/16/2016  [name], identified by name and date of birth, is a 71 y.o. female with a diagnosis of Diabetes: Type 2.  Other hx includes GERD, anemia, asthma, CHF, CABGx5 2014 or 15, gout, OSA and just got c-pap but has not used it yet.  She also has CKD.   She was last seen by [place] in 2016 for 3 core classes related to Diabetes diagnosis.  Patient lives with her sone who is on disability.  He also works part time.  She is retired.  She states that they have problems affording food at times.  She does not like breakfast and gets most of her sweets intake at that time.  ASSESSMENT  Height 5\' 6"  (1.676 m), weight 248 lb (112.5 kg). Body mass index is 40.03 kg/m.      Diabetes Self-Management Education - 06/16/16 0944      Visit Information   Visit Type Follow-up     Initial Visit   Diabetes Type Type 2   Are you taking your medications as prescribed? Not on Medications   Date Diagnosed 2016     Health Coping   How would you rate your overall health? Fair     Psychosocial Assessment   Patient Belief/Attitude about Diabetes Afraid  afraid of shots   Self-care barriers Lack of material resources   Self-management support Doctor's office;Family   Other persons present Patient   Patient Concerns Nutrition/Meal planning   Special Needs None   Preferred Learning Style No preference indicated   Learning Readiness Ready   How often do you need to have someone help you when you read instructions, pamphlets, or other written materials from your doctor or pharmacy? 1 - Never   What is the last grade level you completed in school? 11th grade     Pre-Education Assessment   Patient understands the diabetes disease and treatment process. Demonstrates understanding / competency   Patient understands incorporating nutritional management into lifestyle. Needs Review   Patient  undertands incorporating physical activity into lifestyle. Demonstrates understanding / competency   Patient understands using medications safely. Demonstrates understanding / competency   Patient understands monitoring blood glucose, interpreting and using results Needs Review   Patient understands prevention, detection, and treatment of acute complications. Demonstrates understanding / competency   Patient understands prevention, detection, and treatment of chronic complications. Needs Review   Patient understands how to develop strategies to address psychosocial issues. Needs Review   Patient understands how to develop strategies to promote health/change behavior. Needs Review     Complications   Last HgB A1C per patient/outside source 6.5 %  10/2015   How often do you check your blood sugar? 1-2 times/day   Fasting Blood glucose range (mg/dL) 70-129   Postprandial Blood glucose range (mg/dL) 130-179   Number of hypoglycemic episodes per month 0   Number of hyperglycemic episodes per week 0   Have you had a dilated eye exam in the past 12 months? Yes   Have you had a dental exam in the past 12 months? No  dentures   Are you checking your feet? Yes   How many days per week are you checking your feet? 3     Dietary Intake   Breakfast coffee with coffee mate dry creamer (PB crackers with unsalted crackers or ritz or [employer] toast) OR donut or pie or cookie  6-7 am  Snack (morning) none   Lunch Marie Calendar Chicken Pot Pie  12:30   Snack (afternoon) occasional lite popcorn   Dinner Chicken breast and vegetables (peas, corn, butter beans, cauliflower, carrots, brocoli) rice OR breaded fish (tarter sauce and veges) with slaw   Snack (evening) none   Beverage(s)  water, diet coke zero or diet dr. Malachi Bonds, coffee with coffee mate, crystal lite     Exercise   Exercise Type Light (walking / raking leaves)  walks dog also member of [employer] (silver sneakers-class MWF)   How many days  per week to you exercise? 3   How many minutes per day do you exercise? 30   Total minutes per week of exercise 90     Patient Education   Previous Diabetes Education Yes (please comment)  Core classes 2016   Nutrition management  Role of diet in the treatment of diabetes and the relationship between the three main macronutrients and blood glucose level;Food label reading, portion sizes and measuring food.;Meal options for control of blood glucose level and chronic complications.;Information on hints to eating out and maintain blood glucose control.   Physical activity and exercise  Other (comment)  encouraged slow, steady progress and benefits of exercise   Medications Other (comment)  discussed importance of taking medication if deemed necesary   Monitoring Identified appropriate SMBG and/or A1C goals.;Daily foot exams   Psychosocial adjustment Worked with patient to identify barriers to care and solutions;Identified and addressed patients feelings and concerns about diabetes   Personal strategies to promote health Lifestyle issues that need to be addressed for better diabetes care     Individualized Goals (developed by patient)   Nutrition General guidelines for healthy choices and portions discussed   Physical Activity Exercise 3-5 times per week;30 minutes per day   Medications Not Applicable   Monitoring  test my blood glucose as discussed   Problem Solving healthier meal choices (balance)   Reducing Risk examine blood glucose patterns;do foot checks daily   Health Coping discuss diabetes with (comment)  MD/RD/sister     Post-Education Assessment   Patient understands the diabetes disease and treatment process. Demonstrates understanding / competency   Patient understands incorporating nutritional management into lifestyle. Needs Review   Patient undertands incorporating physical activity into lifestyle. Needs Review   Patient understands using medications safely. Demonstrates  understanding / competency   Patient understands monitoring blood glucose, interpreting and using results Demonstrates understanding / competency   Patient understands prevention, detection, and treatment of acute complications. Demonstrates understanding / competency   Patient understands prevention, detection, and treatment of chronic complications. Demonstrates understanding / competency   Patient understands how to develop strategies to address psychosocial issues. Demonstrates understanding / competency   Patient understands how to develop strategies to promote health/change behavior. Needs Review     Outcomes   Expected Outcomes Demonstrated interest in learning. Expect positive outcomes   Future DMSE 2 months   Program Status Completed     Subsequent Visit   Since your last visit have you continued or begun to take your medications as prescribed? Not on Medications   Since your last visit, are you checking your blood glucose at least once a day? Yes      Individualized Plan for Diabetes Self-Management Training:   Learning Objective:  Patient will have a greater understanding of diabetes self-management. Patient education plan is to attend individual and/or group sessions per assessed needs and concerns.   Plan:   Patient  Instructions  Fluid limit (based on your report) is 2 liters per day.  This is 8 cups. Continue to avoid added salt.  Buy fresh meat (when possible).  Fresh or frozen vegetables without salt.  Canned vegetables without salt or rinse them off.  Only 1 ounce cheese per day.  Avoid processed meat (look for low sodium). Eat breakfast daily. Limit the portion sizes of sweets.   Bake, broil, boil, or grill rather than fry. Only small amounts of added fat. Stop eating when you are satisfied.      Expected Outcomes:  Demonstrated interest in learning. Expect positive outcomes  Education material provided: A1C conversion sheet, Meal plan card, My Plate and  Snack sheet , breakfast ideas, Food insecurity handout with other food box and assistance options.  If problems or questions, patient to contact team via:  Phone  Future DSME appointment: 2 months

## 2016-06-23 ENCOUNTER — Other Ambulatory Visit: Payer: Self-pay | Admitting: Family Medicine

## 2016-06-23 DIAGNOSIS — N6002 Solitary cyst of left breast: Secondary | ICD-10-CM

## 2016-06-25 ENCOUNTER — Telehealth: Payer: Self-pay | Admitting: Cardiovascular Disease

## 2016-06-25 NOTE — Telephone Encounter (Signed)
Spoke with patient. Patient states she had to reschedule 06/30/16 appointment because it conflicted with primary appointment.  patient aware will defer to Dr Croitoru's CMA- to assist with an appointment sooner than AUG if possible

## 2016-06-25 NOTE — Telephone Encounter (Signed)
New message  Pt call to reschedule Dr. Loletha Grayer appt. Because of conflict with another appt. Pt would like to reschedule for a soon availability than august. Please call back to discuss

## 2016-06-26 DIAGNOSIS — I5033 Acute on chronic diastolic (congestive) heart failure: Secondary | ICD-10-CM | POA: Diagnosis not present

## 2016-06-26 DIAGNOSIS — I2510 Atherosclerotic heart disease of native coronary artery without angina pectoris: Secondary | ICD-10-CM | POA: Diagnosis not present

## 2016-06-26 DIAGNOSIS — E1122 Type 2 diabetes mellitus with diabetic chronic kidney disease: Secondary | ICD-10-CM | POA: Diagnosis not present

## 2016-06-26 DIAGNOSIS — I5032 Chronic diastolic (congestive) heart failure: Secondary | ICD-10-CM | POA: Diagnosis not present

## 2016-06-26 DIAGNOSIS — I10 Essential (primary) hypertension: Secondary | ICD-10-CM | POA: Diagnosis not present

## 2016-06-26 DIAGNOSIS — N183 Chronic kidney disease, stage 3 (moderate): Secondary | ICD-10-CM | POA: Diagnosis not present

## 2016-06-26 DIAGNOSIS — E119 Type 2 diabetes mellitus without complications: Secondary | ICD-10-CM | POA: Diagnosis not present

## 2016-06-26 DIAGNOSIS — J4520 Mild intermittent asthma, uncomplicated: Secondary | ICD-10-CM | POA: Diagnosis not present

## 2016-06-27 ENCOUNTER — Ambulatory Visit
Admission: RE | Admit: 2016-06-27 | Discharge: 2016-06-27 | Disposition: A | Discharge status: Home / Self Care | Payer: Medicare HMO | Source: Ambulatory Visit | Attending: Family Medicine | Admitting: Family Medicine

## 2016-06-27 DIAGNOSIS — N6002 Solitary cyst of left breast: Secondary | ICD-10-CM

## 2016-06-27 DIAGNOSIS — R928 Other abnormal and inconclusive findings on diagnostic imaging of breast: Secondary | ICD-10-CM | POA: Diagnosis not present

## 2016-06-27 NOTE — Telephone Encounter (Signed)
Appt rescheduled to 08/19/16 at 10a.

## 2016-06-30 ENCOUNTER — Ambulatory Visit: Payer: Medicare HMO | Admitting: Cardiovascular Disease

## 2016-06-30 ENCOUNTER — Telehealth (HOSPITAL_COMMUNITY): Payer: Self-pay | Admitting: *Deleted

## 2016-06-30 DIAGNOSIS — I10 Essential (primary) hypertension: Secondary | ICD-10-CM | POA: Diagnosis not present

## 2016-06-30 DIAGNOSIS — I429 Cardiomyopathy, unspecified: Secondary | ICD-10-CM | POA: Diagnosis not present

## 2016-06-30 DIAGNOSIS — I2510 Atherosclerotic heart disease of native coronary artery without angina pectoris: Secondary | ICD-10-CM | POA: Diagnosis not present

## 2016-06-30 DIAGNOSIS — Z0000 Encounter for general adult medical examination without abnormal findings: Secondary | ICD-10-CM | POA: Diagnosis not present

## 2016-06-30 DIAGNOSIS — E7800 Pure hypercholesterolemia, unspecified: Secondary | ICD-10-CM | POA: Diagnosis not present

## 2016-06-30 DIAGNOSIS — I700 Atherosclerosis of aorta: Secondary | ICD-10-CM | POA: Diagnosis not present

## 2016-06-30 DIAGNOSIS — N183 Chronic kidney disease, stage 3 (moderate): Secondary | ICD-10-CM | POA: Diagnosis not present

## 2016-06-30 DIAGNOSIS — I5033 Acute on chronic diastolic (congestive) heart failure: Secondary | ICD-10-CM | POA: Diagnosis not present

## 2016-06-30 DIAGNOSIS — E1122 Type 2 diabetes mellitus with diabetic chronic kidney disease: Secondary | ICD-10-CM | POA: Diagnosis not present

## 2016-06-30 NOTE — Telephone Encounter (Signed)
Dr.Gates called this morning stating patient told her she called here last week and did not receive a return call. Patient is complaining of weight gain, fatigue, and shortness of breath.  Per Dr.Gates patient has not been taking Torsemide as prescribed patient is only taking 20mg  daily because she feels "drained". Called patient to get more information no answer left VM.

## 2016-07-01 DIAGNOSIS — J4530 Mild persistent asthma, uncomplicated: Secondary | ICD-10-CM | POA: Diagnosis not present

## 2016-07-01 DIAGNOSIS — G4733 Obstructive sleep apnea (adult) (pediatric): Secondary | ICD-10-CM | POA: Diagnosis not present

## 2016-07-02 ENCOUNTER — Telehealth (HOSPITAL_COMMUNITY): Payer: Self-pay | Admitting: *Deleted

## 2016-07-02 MED ORDER — TORSEMIDE 20 MG PO TABS: 60.0000 mg | ORAL_TABLET | Freq: Every day | ORAL | 6 refills | Status: DC

## 2016-07-02 NOTE — Telephone Encounter (Signed)
Patient called in stating that she has been having increased shortness of breath, increase swelling in BLE, and 10 lb weight gain since her last office visit. She stated that she had been taking 40 mg of Torsemide in the AM and 20 mg in the PM alternating with just 40 mg Daily.    I spoke with Oda Kilts, PA and he advised patient to take Torsemide 80 mg in the AM for 2 days with an extra potassium for 2 days then start taking 60 mg in the AM every day.  I also educated patient about keeping her fluid intake to less than 2L per day and low carb, low sodium diet.  Patient verbalizes that she understands and no further questions at this time.

## 2016-07-08 ENCOUNTER — Other Ambulatory Visit (HOSPITAL_COMMUNITY): Payer: Self-pay | Admitting: Internal Medicine

## 2016-07-09 ENCOUNTER — Other Ambulatory Visit (HOSPITAL_BASED_OUTPATIENT_CLINIC_OR_DEPARTMENT_OTHER): Payer: Self-pay | Admitting: Pulmonary Disease

## 2016-07-10 DIAGNOSIS — H43813 Vitreous degeneration, bilateral: Secondary | ICD-10-CM | POA: Diagnosis not present

## 2016-07-10 DIAGNOSIS — H04123 Dry eye syndrome of bilateral lacrimal glands: Secondary | ICD-10-CM | POA: Diagnosis not present

## 2016-07-21 ENCOUNTER — Ambulatory Visit (HOSPITAL_COMMUNITY)
Admission: RE | Admit: 2016-07-21 | Discharge: 2016-07-21 | Disposition: A | Discharge status: Home / Self Care | Payer: Medicare HMO | Source: Ambulatory Visit | Attending: Internal Medicine | Admitting: Internal Medicine

## 2016-07-21 ENCOUNTER — Other Ambulatory Visit: Payer: Self-pay | Admitting: Physician Assistant

## 2016-07-21 ENCOUNTER — Encounter (HOSPITAL_COMMUNITY): Payer: Self-pay | Admitting: Internal Medicine

## 2016-07-21 VITALS — BP 156/74 | HR 87 | Wt 252.0 lb

## 2016-07-21 DIAGNOSIS — I442 Atrioventricular block, complete: Secondary | ICD-10-CM | POA: Diagnosis not present

## 2016-07-21 DIAGNOSIS — I2510 Atherosclerotic heart disease of native coronary artery without angina pectoris: Secondary | ICD-10-CM | POA: Insufficient documentation

## 2016-07-21 DIAGNOSIS — Z888 Allergy status to other drugs, medicaments and biological substances status: Secondary | ICD-10-CM | POA: Diagnosis not present

## 2016-07-21 DIAGNOSIS — G4733 Obstructive sleep apnea (adult) (pediatric): Secondary | ICD-10-CM | POA: Diagnosis not present

## 2016-07-21 DIAGNOSIS — I5032 Chronic diastolic (congestive) heart failure: Secondary | ICD-10-CM | POA: Insufficient documentation

## 2016-07-21 DIAGNOSIS — I130 Hypertensive heart and chronic kidney disease with heart failure and stage 1 through stage 4 chronic kidney disease, or unspecified chronic kidney disease: Secondary | ICD-10-CM | POA: Diagnosis not present

## 2016-07-21 DIAGNOSIS — Z91013 Allergy to seafood: Secondary | ICD-10-CM | POA: Diagnosis not present

## 2016-07-21 DIAGNOSIS — Z7982 Long term (current) use of aspirin: Secondary | ICD-10-CM | POA: Insufficient documentation

## 2016-07-21 DIAGNOSIS — Z882 Allergy status to sulfonamides status: Secondary | ICD-10-CM | POA: Diagnosis not present

## 2016-07-21 DIAGNOSIS — K219 Gastro-esophageal reflux disease without esophagitis: Secondary | ICD-10-CM | POA: Insufficient documentation

## 2016-07-21 DIAGNOSIS — N183 Chronic kidney disease, stage 3 unspecified: Secondary | ICD-10-CM

## 2016-07-21 DIAGNOSIS — Z951 Presence of aortocoronary bypass graft: Secondary | ICD-10-CM | POA: Diagnosis not present

## 2016-07-21 DIAGNOSIS — Z79899 Other long term (current) drug therapy: Secondary | ICD-10-CM | POA: Diagnosis not present

## 2016-07-21 DIAGNOSIS — Z87891 Personal history of nicotine dependence: Secondary | ICD-10-CM | POA: Insufficient documentation

## 2016-07-21 DIAGNOSIS — I428 Other cardiomyopathies: Secondary | ICD-10-CM | POA: Diagnosis not present

## 2016-07-21 LAB — BASIC METABOLIC PANEL
Anion gap: 8 (ref 5–15)
BUN: 24 mg/dL — ABNORMAL HIGH (ref 6–20)
CO2: 29 mmol/L (ref 22–32)
Calcium: 9.1 mg/dL (ref 8.9–10.3)
Chloride: 102 mmol/L (ref 101–111)
Creatinine, Ser: 1.51 mg/dL — ABNORMAL HIGH (ref 0.44–1.00)
GFR calc Af Amer: 39 mL/min — ABNORMAL LOW (ref >60)
GFR calc non Af Amer: 33 mL/min — ABNORMAL LOW (ref >60)
Glucose, Bld: 154 mg/dL — ABNORMAL HIGH (ref 65–99)
Potassium: 3.9 mmol/L (ref 3.5–5.1)
Sodium: 139 mmol/L (ref 135–145)

## 2016-07-21 LAB — BRAIN NATRIURETIC PEPTIDE: B Natriuretic Peptide: 168.5 pg/mL — ABNORMAL HIGH (ref 0.0–100.0)

## 2016-07-21 MED ORDER — METOLAZONE 5 MG PO TABS: 5.0000 mg | ORAL_TABLET | ORAL | 0 refills | Status: DC | PRN

## 2016-07-21 MED ORDER — TORSEMIDE 20 MG PO TABS: 60.0000 mg | ORAL_TABLET | Freq: Every day | ORAL | 3 refills | Status: DC

## 2016-07-21 NOTE — Progress Notes (Signed)
ADVANCED HF CLINIC NOTE  Patient ID: [name], female   DOB: June 03, 1945, 71 y.o.   MRN: [id number]  Primary Cardiologist: Dr. Dani Gobble Croitoru Primary HF: Dr. Haroldine Laws   HPI: [name] is a 71 year old with history of morbid obesity, DM, HTN, asthma, OSA, ICD, GERD, arthritis, CRT-D 2008 [employer], CAD, S/P CABG x5 11/29/2014.  Discharged from [facility] 12/07/14 s/p CABG. Discharge weight 252.  Went to [facility].  Readmitted 12/21/14 with RLE cellulitis and volume overload with weight of 273 and complaints of dyspnea and orthopnea. RLE cellulitis treated with IV ABX. [name] was had brisk diuresis with lasix gtt. Diuresis slowed down with slight Cr bump. Acetazolamide added and monitored closely with history of sulfa allergy. Diuresed well, but lasix held with continued Cr bump. Transitioned to po torsemide for d/c. Discharge weight 246 lbs.  Admitted 1/23 through 03/09/16 with CP. CEs negative. [name] did not require a heart craterization.   [name] returns today for HF follow up. [name] is feeling poorly today, feels like [name] cannot take a full breath. Taking 60mg  of torsemide daily alternating with 40mg  daily. Weight up about 4 pounds from last visit. Endorses orthopnea, uses 2 pillows. Not wearing CPAP at night, [name] is afraid of it, feels claustrophobic. Says that [name] is drinking less than 2L a day, drinking low sodium foods. [name] has seen a nutritionist and that has helped some, but says that [name] is very limited financially. [name] started feeling dizzy and lightheaded during our encounter. CBG was 109. SOB with walking into clinic, SOB with stairs. Not able to exercise because [name] feels like her legs are too weak.    Echo 11/24/14 EF 55-60%, Grade 1 DD. Mild TR TEE 11/29/14 EF 55-60%, LVH, Mild AR and MR LHC 11/2014 Prox RCA lesion, 85% stenosed.  Mid RCA lesion, 90% stenosed.  1st Mrg lesion, 85% stenosed.  Dist Cx lesion, 60% stenosed.  Ost 1st Diag lesion, 70%  stenosed.  1st Diag lesion, 70% stenosed.  Ost 2nd Diag to 2nd Diag lesion, 70% stenosed.  Mid LAD to Dist LAD lesion, 50% stenosed.  Labs 02/21/2015: K 4.5 Creatinine 1.39  Labs 07/12/2015: K 4.4 Creatinine 1.14 Hgb 11.5   Past Medical History:  Diagnosis Date  . Anemia   . Arthritis   . Asthma   . CAD (coronary artery disease)   . Cellulitis and abscess of foot 12/2014   RT FOOT  . CHF (congestive heart failure) ([place])   . Diabetes mellitus without complication ([place])   . Diverticulosis   . Facial numbness 02/2016  . Fatty liver disease, nonalcoholic   . Gastritis   . GERD (gastroesophageal reflux disease)   . H/O hiatal hernia   . Hx of CABG   . Hyperplastic colon polyp   . Hypertension   . IBS (irritable bowel syndrome)   . ICD (implantable cardiac defibrillator) in place   . Internal hemorrhoids   . Ischemic cardiomyopathy   . Obesity   . OSA (obstructive sleep apnea)   . Shortness of breath     Current Outpatient Prescriptions  Medication Sig Dispense Refill  . acetaminophen (TYLENOL) 500 MG tablet Take 1,000 mg by mouth every 6 (six) hours as needed (for pain.).    [name] aspirin EC 81 MG tablet Take 1 tablet (81 mg total) by mouth daily. 90 tablet 3  . carvedilol (COREG) 6.25 MG tablet Take 1 tablet (6.25 mg total) by mouth 2 (two) times daily with a meal.  180 tablet 3  . cetirizine (ZYRTEC) 10 MG tablet Take 10 mg by mouth daily.     . clopidogrel (PLAVIX) 75 MG tablet Take 1 tablet (75 mg total) by mouth daily. 90 tablet 3  . clotrimazole (MYCELEX) 10 MG troche Take 10 mg by mouth every 4 (four) hours as needed (mouth pain).    . colchicine 0.6 MG tablet Take 0.6 mg by mouth 2 (two) times daily.    [name] EPINEPHrine 0.3 mg/0.3 mL IJ SOAJ injection Inject 0.3 mg into the muscle daily as needed (allergic reaction).     . fluorometholone (FML) 0.1 % ophthalmic suspension Place 1 drop into both eyes 2 (two) times daily.    . fluticasone (FLONASE) 50 MCG/ACT nasal spray Place  1 spray into the nose 2 (two) times daily as needed for rhinitis or allergies.     . isosorbide mononitrate (IMDUR) 30 MG 24 hr tablet Take 1 tablet (30 mg total) by mouth daily. 30 tablet 0  . omeprazole (PRILOSEC) 40 MG capsule Take 1 capsule (40 mg total) by mouth 2 (two) times daily before a meal. 60 capsule 11  . potassium chloride SA (K-DUR,KLOR-CON) 20 MEQ tablet Take 20-40 mEq by mouth as directed. Take 1 tablet every other day alternating with 2 tablets every other day.    . rosuvastatin (CRESTOR) 20 MG tablet Take 1 tablet (20 mg total) by mouth daily. 90 tablet 3  . spironolactone (ALDACTONE) 25 MG tablet Take 1 tablet (25 mg total) by mouth daily. 90 tablet 3  . SYMBICORT 160-4.5 MCG/ACT inhaler Inhale 2 puffs into the lungs 2 (two) times daily.    [name] torsemide (DEMADEX) 20 MG tablet Take 60-80 mg by mouth daily. Take 60mg  every other day alternating with 80mg  every other day    . triamcinolone cream (KENALOG) 0.1 % Apply 1 application topically daily as needed (applied to affected itchy area(s)).     . VENTOLIN HFA 108 (90 Base) MCG/ACT inhaler INHALE 2 PUFFS EVERY 4 HOURS AS NEEDED FOR WHEEZING 18 g 3   No current facility-administered medications for this encounter.     Allergies  Allergen Reactions  . Ivp Dye [Iodinated Diagnostic Agents] Shortness Of Breath    No reaction to PO contrast with non-ionic dye.06-25-2014/rsm  . Shellfish-Derived Products Anaphylaxis  . Sulfa Antibiotics Shortness Of Breath  . Uloric [Febuxostat] Nausea And Vomiting  . Iodine Hives  . Atorvastatin Other (See Comments)    Pt states "causes bilateral leg pain/cramps."  . Cephalexin Itching and Rash    Rash & itch after Keflex  . Zithromax [Azithromycin] Rash      Social History   Social History  . Marital status: Widowed    Spouse name: N/A  . Number of children: 5  . Years of education: N/A   Occupational History  . 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  Smoking status: Former Smoker    Packs/day: 0.25    Years: 3.00    Types: Cigarettes    Quit date: 02/11/1968  . Smokeless tobacco: Never Used  . Alcohol use No  . Drug use: No  . Sexual activity: No   Other Topics Concern  . Not on file   Social History Narrative   Widowed last year. [name] lives with her two sons.      Family History  Problem Relation Age of Onset  . Breast cancer Mother   . Diabetes Mother   . Heart disease  Maternal Grandmother   . Kidney disease Maternal Grandmother   . Diabetes Maternal Grandmother   . Glaucoma Maternal Aunt   . Heart disease Maternal Aunt   . Asthma Sister     Vitals:   07/21/16 1340  BP: (!) 156/74  Pulse: 87  SpO2: 95%  Weight: 252 lb (114.3 kg)   Wt Readings from Last 3 Encounters:  07/21/16 252 lb (114.3 kg)  06/16/16 248 lb (112.5 kg)  06/09/16 249 lb 6.4 oz (113.1 kg)    Chest Circumfrence 144 cm.   PHYSICAL EXAM: General: Obese female, walked into clinic without difficulty.  HEENT: normal Neck: supple. Difficult to assess due to body habitus, appears elevated.  Carotids 2+ bilat; no bruits. No thyromegaly or nodule noted. Cor: PMI nondisplaced. Regular rate and rhythm. No M/R/G noted.  Lungs: Clear bilaterally. Normal effort.  Abdomen: soft, non-tender, distended, no HSM. No bruits or masses. Bowel sounds present.  Extremities: no cyanosis, clubbing, rash, warm. Trace pretibial edema.  Neuro: alert & orientedx3, cranial nerves grossly intact. moves all 4 extremities w/o difficulty. Affect pleasant      ASSESSMENT & PLAN: 1. Chronic diastolic HF - TEE 02/02/81 EF 55-60%, mild LVH.  - NYHA III - Volume status elevated. - Increase torsemide 60mg  daily. - Give metolazone 5 mg today.  - Continue Spiro 25mg  daily.  - [name] would benefit from cardiomems. Awaiting insurance approval for consideration, will follow up with coordinator.   - Reinforced fluid restriction to < 2 L daily, sodium restriction to less than 2000  mg daily, and the importance of daily weights.    2. CAD s/p CABG 11/2014 On statin, bb, aspirin.  - Denies chest pain  3. Morbid obesity - Discussed her diet and healthy low cost options.  4. H/o heart block s/p BiV pacer/CRT-D [employer] - with malfunction of A lead and chronic penetration of V lead into pericardium.  - Stable, no change.  5. Chronic renal failure stage III - BMET today and next week after metolazone and increased torsemide.  6. OSA:  - [name] refuses to wear CPAP.     Follow up in 2 months.   Arbutus Leas, NP   1:46 PM

## 2016-07-21 NOTE — Patient Instructions (Signed)
Take Metolazone 5 mg (1 Tablet) Once Today only.  Only take this as directed by HF Clinic  INCREASE Torsemide to 60 mg (3 Tablets) Once Daily  Labs today (will call for abnormal results, otherwise no news is good news)  Labs next week (BMET)  Follow up in 2 Months

## 2016-07-28 ENCOUNTER — Telehealth (HOSPITAL_COMMUNITY): Payer: Self-pay

## 2016-07-28 ENCOUNTER — Ambulatory Visit (HOSPITAL_COMMUNITY)
Admission: RE | Admit: 2016-07-28 | Discharge: 2016-07-28 | Disposition: A | Discharge status: Home / Self Care | Payer: Medicare HMO | Source: Ambulatory Visit | Attending: Cardiology | Admitting: Cardiology

## 2016-07-28 ENCOUNTER — Telehealth: Payer: Self-pay | Admitting: Physician Assistant

## 2016-07-28 DIAGNOSIS — I5032 Chronic diastolic (congestive) heart failure: Secondary | ICD-10-CM | POA: Diagnosis not present

## 2016-07-28 LAB — BASIC METABOLIC PANEL
Anion gap: 14 (ref 5–15)
BUN: 46 mg/dL — ABNORMAL HIGH (ref 6–20)
CO2: 28 mmol/L (ref 22–32)
Calcium: 9 mg/dL (ref 8.9–10.3)
Chloride: 94 mmol/L — ABNORMAL LOW (ref 101–111)
Creatinine, Ser: 1.94 mg/dL — ABNORMAL HIGH (ref 0.44–1.00)
GFR calc Af Amer: 29 mL/min — ABNORMAL LOW (ref >60)
GFR calc non Af Amer: 25 mL/min — ABNORMAL LOW (ref >60)
Glucose, Bld: 138 mg/dL — ABNORMAL HIGH (ref 65–99)
Potassium: 3.9 mmol/L (ref 3.5–5.1)
Sodium: 136 mmol/L (ref 135–145)

## 2016-07-28 NOTE — Telephone Encounter (Signed)
Basic metabolic panel  Order: [id number]  Status:  Final result Visible to patient:  No (Not Released) Dx:  Chronic diastolic heart failure ([place])  Notes recorded by Effie Berkshire, RN on 07/28/2016 at 4:44 PM EDT LVMTCB. Also received walk in form from her lab visit tthat she was having head and neck pain with dizziness and syncope. No answer, advised to call us back and report to ED for any worsening symptoms. ------  Notes recorded by Arbutus Leas, NP on 07/28/2016 at 2:16 PM EDT Please have Ms. Cattell hold her torsemide for 2 days, then restart at 40mg  a day. Also, please have her come in to see me in 2 weeks.

## 2016-07-28 NOTE — Telephone Encounter (Signed)
Patient with history of CAD status post CABG followed by heart failure service contacted cardiology service for complaint of weakness since last week. She did have an episode of chest pain over a week ago that lasted 30 minutes. She says she did not take any nitroglycerin. She has not had any recurrent chest pain since. Today's lab shows her renal function is worsening. She is likely dehydrated. I recommended for her to increase fluid intake for today only, she understands she is at high risk of developing recurrent heart failure if she increase her wind status on daily basis. She will continue to monitor her symptom after increasing fluid intake for today, if her symptom worsens, I recommended her to seek urgent medical attention.  Hilbert Corrigan PA Pager: [phone]

## 2016-07-29 DIAGNOSIS — N183 Chronic kidney disease, stage 3 (moderate): Secondary | ICD-10-CM | POA: Diagnosis not present

## 2016-07-29 DIAGNOSIS — I2510 Atherosclerotic heart disease of native coronary artery without angina pectoris: Secondary | ICD-10-CM | POA: Diagnosis not present

## 2016-07-29 DIAGNOSIS — E1122 Type 2 diabetes mellitus with diabetic chronic kidney disease: Secondary | ICD-10-CM | POA: Diagnosis not present

## 2016-07-29 DIAGNOSIS — J4520 Mild intermittent asthma, uncomplicated: Secondary | ICD-10-CM | POA: Diagnosis not present

## 2016-07-29 DIAGNOSIS — I10 Essential (primary) hypertension: Secondary | ICD-10-CM | POA: Diagnosis not present

## 2016-07-29 DIAGNOSIS — I5032 Chronic diastolic (congestive) heart failure: Secondary | ICD-10-CM | POA: Diagnosis not present

## 2016-07-29 DIAGNOSIS — E119 Type 2 diabetes mellitus without complications: Secondary | ICD-10-CM | POA: Diagnosis not present

## 2016-07-29 DIAGNOSIS — I5033 Acute on chronic diastolic (congestive) heart failure: Secondary | ICD-10-CM | POA: Diagnosis not present

## 2016-08-01 DIAGNOSIS — J4530 Mild persistent asthma, uncomplicated: Secondary | ICD-10-CM | POA: Diagnosis not present

## 2016-08-01 DIAGNOSIS — G4733 Obstructive sleep apnea (adult) (pediatric): Secondary | ICD-10-CM | POA: Diagnosis not present

## 2016-08-05 ENCOUNTER — Telehealth (HOSPITAL_COMMUNITY): Payer: Self-pay | Admitting: Cardiology

## 2016-08-05 MED ORDER — TORSEMIDE 20 MG PO TABS: 40.0000 mg | ORAL_TABLET | Freq: Every day | ORAL | 3 refills | Status: DC

## 2016-08-05 NOTE — Telephone Encounter (Signed)
-----   Message from Arbutus Leas, NP sent at 07/28/2016  2:16 PM EDT ----- Please have Ms. Seckman hold her torsemide for 2 days, then restart at 40mg  a day. Also, please have her come in to see me in 2 weeks.

## 2016-08-05 NOTE — Telephone Encounter (Signed)
Patient aware. PATIENT VOICED UNDERSTANDING, RETURN FOR FOLLOW UP 7/6

## 2016-08-07 DIAGNOSIS — H43813 Vitreous degeneration, bilateral: Secondary | ICD-10-CM | POA: Diagnosis not present

## 2016-08-07 DIAGNOSIS — H04123 Dry eye syndrome of bilateral lacrimal glands: Secondary | ICD-10-CM | POA: Diagnosis not present

## 2016-08-07 DIAGNOSIS — H02831 Dermatochalasis of right upper eyelid: Secondary | ICD-10-CM | POA: Diagnosis not present

## 2016-08-15 ENCOUNTER — Inpatient Hospital Stay (HOSPITAL_COMMUNITY): Admission: RE | Admit: 2016-08-15 | Payer: Medicare HMO | Source: Ambulatory Visit

## 2016-08-18 ENCOUNTER — Ambulatory Visit: Payer: Medicare HMO | Admitting: Dietician

## 2016-08-19 ENCOUNTER — Encounter: Payer: Self-pay | Admitting: Cardiovascular Disease

## 2016-08-19 ENCOUNTER — Ambulatory Visit (INDEPENDENT_AMBULATORY_CARE_PROVIDER_SITE_OTHER): Payer: Medicare HMO | Admitting: Cardiovascular Disease

## 2016-08-19 VITALS — BP 103/62 | HR 77 | Ht 66.0 in | Wt 249.0 lb

## 2016-08-19 DIAGNOSIS — R0602 Shortness of breath: Secondary | ICD-10-CM

## 2016-08-19 DIAGNOSIS — I5032 Chronic diastolic (congestive) heart failure: Secondary | ICD-10-CM | POA: Diagnosis not present

## 2016-08-19 DIAGNOSIS — Z950 Presence of cardiac pacemaker: Secondary | ICD-10-CM | POA: Diagnosis not present

## 2016-08-19 DIAGNOSIS — G4733 Obstructive sleep apnea (adult) (pediatric): Secondary | ICD-10-CM | POA: Diagnosis not present

## 2016-08-19 DIAGNOSIS — J4530 Mild persistent asthma, uncomplicated: Secondary | ICD-10-CM

## 2016-08-19 DIAGNOSIS — I442 Atrioventricular block, complete: Secondary | ICD-10-CM

## 2016-08-19 DIAGNOSIS — I428 Other cardiomyopathies: Secondary | ICD-10-CM

## 2016-08-19 DIAGNOSIS — I2510 Atherosclerotic heart disease of native coronary artery without angina pectoris: Secondary | ICD-10-CM

## 2016-08-19 MED ORDER — BISOPROLOL FUMARATE 5 MG PO TABS: 5.0000 mg | ORAL_TABLET | Freq: Every day | ORAL | 3 refills | Status: DC

## 2016-08-19 NOTE — Patient Instructions (Signed)
Dr [name] has recommended making the following medication changes: 1. STOP Carvedilol 2. START Bisoprolol 5 mg - take 1 tablet by mouth daily  Your physician recommends that you return for lab work TODAY.  Your physician recommends that you have chest x-ray. A chest x-ray takes a picture of the organs and structures inside the chest, including the heart, lungs, and blood vessels. This test can show several things, including, whether the heart is enlarges; whether fluid is building up in the lungs; and whether pacemaker / defibrillator leads are still in place.  Remote monitoring is used to monitor your Pacemaker of ICD from home. This monitoring reduces the number of office visits required to check your device to one time per year. It allows [place] to keep an eye on the functioning of your device to ensure it is working properly. You are scheduled for a device check from home on Tuesday, October 9th, 2018. You may send your transmission at any time that day. If you have a wireless device, the transmission will be sent automatically. After your physician reviews your transmission, you will receive a postcard with your next transmission date.  Dr [name] recommends that you schedule a follow-up appointment in 6 months with a pacemaker check. You will receive a reminder letter in the mail two months in advance. If you don't receive a letter, please call our office to schedule the follow-up appointment.  If you need a refill on your cardiac medications before your next appointment, please call your pharmacy.

## 2016-08-19 NOTE — Progress Notes (Signed)
Patient ID: [name], female   DOB: 07/16/1945, 71 y.o.   MRN: [id number] .    Cardiology Office Note    Date:  08/19/2016   ID:  [name], DOB [birth year], MRN [id number]  PCP:  Darcus Austin, MD  Cardiologist:   Sanda Klein, MD   Chief Complaint  Patient presents with  . Follow-up    chest pain and SOB and DOE    History of Present Illness:  [name] is a 71 y.o. female with CAD s/p CABG October 2016 (LIMA to LAD, SVG to D1, SVG to OM1 and OM2, SVG to PDA), diastolic heart failure, morbid obesity, adrenal insufficiency, type 2 diabetes mellitus, Hypertension, stage III chronic kidney disease, asthma, obstructive sleep apnea. Nonischemic cardiomyopathy and heart failure preceded the diagnosis of coronary disease. She has complete heart block and had a conventional pacemaker implanted in 2001. This was upgraded to a biventricular ICD implanted in 2008 for severe cardiomyopathy with ejection fraction 20%, downgraded to CRT-P in 2014 ([place]) following excellent response to resynchronization (hyper-responder, normalization of left ventricular systolic function). The current atrial and right ventricular leads were implanted in 2001, the left ventricular lead was implanted in 2008. The defibrillator lead implanted in 2008 is capped and its tip was resected at the time of bypass surgery. Ever since bypass surgery, atrial pacing thresholds have been extremely high. Most recent echo shows ejection fraction normal at 55-60% with grade 1 diastolic dysfunction.   She continues to complain of shortness of breath, but has not had problems with edema or orthopnea. She cannot breathe lying flat on her back, but has no difficulty breathing when she lies horizontally on her right side. She has noticed intermittent wheezing. Her episodes of shortness of breath can occur randomly while sitting up in a chair. She does also have exertional dyspnea and is very short of breath after  just one flight of stairs. She was seen last month and her BNP was not markedly elevated, although abnormal. It was much less compared to values during previous exacerbations of heart failure. One night she felt extremely short of breath and took extra diuretic as well as nebulized albuterol. She felt better quickly.   Today she weighs just slightly more than her previously estimated "dry weight" of around 246 pounds. In the past she had signs and symptoms of hypovolemia at 237 pounds.   She denies angina pectoris, palpitations, dizziness or syncope.  She was seen in the heart failure clinic on on July 11 and due to her complaints of dyspnea her diuretic was increased. Subsequent labs showed worsening of creatinine increasing up to 1.9 and she was told to discontinue the extra diuretic. She has not had labs since, but has a follow-up appointment on July 15 in the heart failure clinic   Interrogation of her [employer] CRT-P shows 100% biventricular pacing. She has very high atrial pacing thresholds ever since she underwent bypass surgery in 2016, but rarely requires atrial pacing. Heart rate histogram distribution is normal, driven by normal sinus rhythm. Otherwise her atrial lead parameters are normal with good sensing of the P waves and normal impedance and the device is programmed VDD .Estimated generator longevity is 7 years. The left ventricular lead is programmed LVtip to LVring. Her ECG shows prominent positive R waves in leads V1 and V2, consistent with effective LV pacing. Heart rate histogram distribution is normal. No recorded atrial fibrillation. She had a brief episode of nonsustained ventricular  tachycardia in December 2017, none since.  Past Medical History:  Diagnosis Date  . Anemia   . Arthritis   . Asthma   . CAD (coronary artery disease)   . Cellulitis and abscess of foot 12/2014   RT FOOT  . CHF (congestive heart failure) ([place])   . Diabetes mellitus without  complication ([place])   . Diverticulosis   . Facial numbness 02/2016  . Fatty liver disease, nonalcoholic   . Gastritis   . GERD (gastroesophageal reflux disease)   . H/O hiatal hernia   . Hx of CABG   . Hyperplastic colon polyp   . Hypertension   . IBS (irritable bowel syndrome)   . ICD (implantable cardiac defibrillator) in place   . Internal hemorrhoids   . Ischemic cardiomyopathy   . Obesity   . OSA (obstructive sleep apnea)   . Shortness of breath     Past Surgical History:  Procedure Laterality Date  . ABDOMINAL ULTRASOUND  12/01/2011   Peripelvic cysts- #1- 2.4x1.9x2.3cm, #2-1.2x0.9x1.2cm  . ANKLE SURGERY     right  . APPENDECTOMY    . BIV PACEMAKER GENERATOR CHANGE OUT N/A 11/02/2012   Procedure: BIV PACEMAKER GENERATOR CHANGE OUT;  Surgeon: Sanda Klein, MD;  Location: [facility] [place] CATH [facility];  Service: Cardiovascular;  Laterality: N/A;  . CARDIAC CATHETERIZATION  05/17/1999   No significant coronary obstructive disease w/ mild 20% luminal irregularity of the first diag branch of the LAD  . CARDIAC CATHETERIZATION  07/08/2002   No significant CAD, moderately depressed LV systolic function  . CARDIAC CATHETERIZATION Bilateral 04/26/2007   Normal findings, recommend medical therapy  . CARDIAC CATHETERIZATION  02/18/2008   Moderate CAD, would benefit from having a functional study, recommend continue medical therapy  . CARDIAC CATHETERIZATION  07/23/2012   Medical therapy  . CARDIAC CATHETERIZATION N/A 11/24/2014   Procedure: Left Heart Cath and Coronary Angiography;  Surgeon: Troy Sine, MD;  Location: [facility] CV LAB;  Service: Cardiovascular;  Laterality: N/A;  . CARDIAC CATHETERIZATION  11/27/2014   Procedure: Intravascular Pressure Wire/FFR Study;  Surgeon: Peter M Martinique, MD;  Location: [facility];  Service: Cardiovascular;;  . CARDIAC DEFIBRILLATOR PLACEMENT    . CERVICAL SPINE SURGERY     plate   . CORONARY ANGIOGRAM  2010  . CORONARY ARTERY BYPASS GRAFT N/A  11/29/2014   Procedure: CORONARY ARTERY BYPASS GRAFTING x 5 (LIMA-LAD, SVG-D, SVG-OM1-OM2, SVG-PD);  Surgeon: Melrose Nakayama, MD;  Location: [facility];  Service: Open Heart Surgery;  Laterality: N/A;  . KNEE SURGERY     right  . LEFT HEART CATHETERIZATION WITH CORONARY ANGIOGRAM N/A 07/23/2012   Procedure: LEFT HEART CATHETERIZATION WITH CORONARY ANGIOGRAM;  Surgeon: Leonie Man, MD;  Location: [facility] C[facility] LAB;  Service: Cardiovascular;  Laterality: N/A;  Carlton Adam MYOVIEW  11/14/2011   Mild-moderate defect seen in Mid Inferolateral and Mid Anterolateral regions-consistant w/ infarct/scar. No significant ischemia demonstrated.  [name] PACEMAKER INSERTION  2001   BS BivICD  . TEE WITHOUT CARDIOVERSION N/A 11/29/2014   Procedure: TRANSESOPHAGEAL ECHOCARDIOGRAM (TEE);  Surgeon: Melrose Nakayama, MD;  Location: [facility];  Service: Open Heart Surgery;  Laterality: N/A;  . TRANSTHORACIC ECHOCARDIOGRAM  07/23/2012   EF 55-60%, normal-mild  . TUBAL LIGATION      Current Medications: Outpatient Medications Prior to Visit  Medication Sig Dispense Refill  . acetaminophen (TYLENOL) 500 MG tablet Take 1,000 mg by mouth every 6 (six) hours as needed (for pain.).    [name]  aspirin EC 81 MG tablet Take 1 tablet (81 mg total) by mouth daily. 90 tablet 3  . cetirizine (ZYRTEC) 10 MG tablet Take 10 mg by mouth daily.     . clopidogrel (PLAVIX) 75 MG tablet Take 1 tablet (75 mg total) by mouth daily. 90 tablet 3  . colchicine 0.6 MG tablet Take 0.6 mg by mouth 2 (two) times daily.    [name] EPINEPHrine 0.3 mg/0.3 mL IJ SOAJ injection Inject 0.3 mg into the muscle daily as needed (allergic reaction).     . fluorometholone (FML) 0.1 % ophthalmic suspension Place 1 drop into both eyes 2 (two) times daily.    . fluticasone (FLONASE) 50 MCG/ACT nasal spray Place 1 spray into the nose 2 (two) times daily as needed for rhinitis or allergies.     . isosorbide mononitrate (IMDUR) 30 MG 24 hr tablet Take 1 tablet (30 mg total) by  mouth daily. 30 tablet 0  . metolazone (ZAROXOLYN) 5 MG tablet Take 1 tablet (5 mg total) by mouth as needed. Take only as directed by Heart Failure Clinic 5 tablet 0  . omeprazole (PRILOSEC) 40 MG capsule Take 1 capsule (40 mg total) by mouth 2 (two) times daily before a meal. 60 capsule 11  . potassium chloride SA (K-DUR,KLOR-CON) 20 MEQ tablet Take 20-40 mEq by mouth as directed. Take 1 tablet every other day alternating with 2 tablets every other day.    . rosuvastatin (CRESTOR) 20 MG tablet Take 1 tablet (20 mg total) by mouth daily. 90 tablet 3  . spironolactone (ALDACTONE) 25 MG tablet Take 1 tablet (25 mg total) by mouth daily. 90 tablet 3  . SYMBICORT 160-4.5 MCG/ACT inhaler Inhale 2 puffs into the lungs 2 (two) times daily.    [name] torsemide (DEMADEX) 20 MG tablet Take 2 tablets (40 mg total) by mouth daily. 90 tablet 3  . triamcinolone cream (KENALOG) 0.1 % Apply 1 application topically daily as needed (applied to affected itchy area(s)).     . VENTOLIN HFA 108 (90 Base) MCG/ACT inhaler INHALE 2 PUFFS EVERY 4 HOURS AS NEEDED FOR WHEEZING 18 g 3  . carvedilol (COREG) 6.25 MG tablet Take 1 tablet (6.25 mg total) by mouth 2 (two) times daily with a meal. 180 tablet 3  . clotrimazole (MYCELEX) 10 MG troche Take 10 mg by mouth every 4 (four) hours as needed (mouth pain).     No facility-administered medications prior to visit.      Allergies:   Ivp dye [iodinated diagnostic agents]; Shellfish-derived products; Sulfa antibiotics; Uloric [febuxostat]; Iodine; Atorvastatin; Cephalexin; and Zithromax [azithromycin]   Social History   Social History  . Marital status: Widowed    Spouse name: N/A  . Number of children: 5  . Years of education: N/A   Occupational History  . 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 Smoking status: Former Smoker    Packs/day: 0.25    Years: 3.00    Types: Cigarettes    Quit date: 02/11/1968  . Smokeless tobacco: Never Used  .  Alcohol use No  . Drug use: No  . Sexual activity: No   Other Topics Concern  . Not on file   Social History Narrative   Widowed last year. She lives with her two sons.     Family History:  The patient's family history includes Asthma in her sister; Breast cancer in her mother; Diabetes in her maternal grandmother and mother; Glaucoma in her maternal aunt;  Heart disease in her maternal aunt and maternal grandmother; Kidney disease in her maternal grandmother.   ROS:   Please see the history of present illness.    ROS All other systems reviewed and are negative.   PHYSICAL EXAM:   VS:  BP 103/62   Pulse 77   Ht 5\' 6"  (1.676 m)   Wt 249 lb (112.9 kg)   SpO2 94%   BMI 40.19 kg/m     General: Morbid obesity limits her physical exam, well developed, in no acute distress  Alert, oriented x3, no distress Head: no evidence of trauma, PERRL, EOMI, no exophtalmos or lid lag, no myxedema, no xanthelasma; normal ears, nose and oropharynx Neck: normal jugular venous pulsations and no hepatojugular reflux; brisk carotid pulses without delay and no carotid bruits Chest: clear to auscultation, no signs of consolidation by percussion or palpation, normal fremitus, symmetrical and full respiratory excursions Cardiovascular: Unable to locate the apical impulse, regular rhythm, normal first and second heart sounds, no murmurs, rubs or gallops. Healthy pacemaker site. Abdomen: no tenderness or distention, no masses by palpation, no abnormal pulsatility or arterial bruits, normal bowel sounds, no hepatosplenomegaly Extremities: no clubbing, cyanosis or edema; 2+ radial, ulnar and brachial pulses bilaterally; 2+ right femoral, posterior tibial and dorsalis pedis pulses; 2+ left femoral, posterior tibial and dorsalis pedis pulses; no subclavian or femoral bruits Neurological: grossly nonfocal Psych: euthymic mood, full affect  Wt Readings from Last 3 Encounters:  08/19/16 249 lb (112.9 kg)  07/21/16  252 lb (114.3 kg)  06/16/16 248 lb (112.5 kg)      Studies/Labs Reviewed:   EKG:  EKG is ordered today: atrial sensed, ventricular paced rhythm. Prominent R waves in V1 and V2. QRS 168 ms, QTC 534 ms  Recent Labs: 03/04/2016: ALT 30 03/06/2016: Hemoglobin 15.3; Platelets 138 07/21/2016: B Natriuretic Peptide 168.5 07/28/2016: BUN 46; Creatinine, Ser 1.94; Potassium 3.9; Sodium 136   Lipid Panel    Component Value Date/Time   CHOL 241 (H) 10/23/2015 0414   TRIG 172 (H) 10/23/2015 0414   HDL 40 (L) 10/23/2015 0414   CHOLHDL 6.0 10/23/2015 0414   VLDL 34 10/23/2015 0414   LDLCALC 167 (H) 10/23/2015 0414    Additional studies/ records that were reviewed today include:  Notes from heart failure clinic 8/29  ASSESSMENT:    1. Chronic diastolic heart failure ([place])   2. CAD, multiple vessel   3. Complete heart block ([place])   4. Nonischemic cardiomyopathy ([facility])   5. Cardiac resynchronization therapy pacemaker (CRT-P) in place   6. OSA (obstructive sleep apnea)   7. Shortness of breath   8. Mild persistent asthma without complication      PLAN:  In order of problems listed above:  1. CHF: As always her obesity makes it hard to assess the onset as clinically, but I don't think she has hypervolemia. She is able to lie horizontally. Her BNP was not markedly elevated and was lower than during previous exacerbations. I suspect volume status is at baseline. We'll recheck her renal parameters. I suspect her dyspnea is related to obesity and reactive lung disease. She did have wheezing and improved with albuterol. We'll switch from carvedilol to a selective beta blocker, bisoprolol 5 mg daily. 2. CAD s/p CABG she does not have angina 3. CHB: She is pacemaker dependent and has 100% biventricular pacing 4. CMP hyper responder with normalization of systolic function after CRT 5. CRT-P with normal device function except for very high atrial pacing  thresholds (she does not require atrial  pacing). Device is programmed VDD. The current right atrial lead and right ventricular lead were placed in 2001. The current left ventricular lead was placed in 2008. Consider replacement of the right atrial lead at the time of her next generator change out, anticipated to be necessary in about 7 years. 6. OSA: She has known obstructive sleep apnea but is not using CPAP. She has not tolerated. She has not seen a pulmonary specialist and discuss this recently. I encouraged her to go back to see Dr.  Elsworth Soho. Consider using a jaw advancement device or a different shape of mask. Dr. Elsworth Soho could also help advise regarding the treatment of reactive airway disease.  Etiology of her dyspnea is multifactorial, including morbid obesity and restrictive physiology, asthma, pulmonary hypertension due to obesity and obstructive sleep apnea as well as heart failure    Medication Adjustments/Labs and Tests Ordered: Current medicines are reviewed at length with the patient today.  Concerns regarding medicines are outlined above.  Medication changes, Labs and Tests ordered today are listed in the Patient Instructions below. Patient Instructions  Dr [name] has recommended making the following medication changes: 1. STOP Carvedilol 2. START Bisoprolol 5 mg - take 1 tablet by mouth daily  Your physician recommends that you return for lab work TODAY.  Your physician recommends that you have chest x-ray. A chest x-ray takes a picture of the organs and structures inside the chest, including the heart, lungs, and blood vessels. This test can show several things, including, whether the heart is enlarges; whether fluid is building up in the lungs; and whether pacemaker / defibrillator leads are still in place.  Remote monitoring is used to monitor your Pacemaker of ICD from home. This monitoring reduces the number of office visits required to check your device to one time per year. It allows [place] to keep an eye on the  functioning of your device to ensure it is working properly. You are scheduled for a device check from home on Tuesday, October 9th, 2018. You may send your transmission at any time that day. If you have a wireless device, the transmission will be sent automatically. After your physician reviews your transmission, you will receive a postcard with your next transmission date.  Dr [name] recommends that you schedule a follow-up appointment in 6 months with a pacemaker check. You will receive a reminder letter in the mail two months in advance. If you don't receive a letter, please call our office to schedule the follow-up appointment.  If you need a refill on your cardiac medications before your next appointment, please call your pharmacy.    Signed, Sanda Klein, MD  08/19/2016 6:45 PM    [facility] [address], [place], [place]  [address] Phone: [phone]; Fax: [phone]

## 2016-08-20 LAB — BASIC METABOLIC PANEL
BUN/Creatinine Ratio: 20 (ref 12–28)
BUN: 27 mg/dL (ref 8–27)
CO2: 31 mmol/L — ABNORMAL HIGH (ref 20–29)
Calcium: 9.2 mg/dL (ref 8.7–10.3)
Chloride: 93 mmol/L — ABNORMAL LOW (ref 96–106)
Creatinine, Ser: 1.37 mg/dL — ABNORMAL HIGH (ref 0.57–1.00)
GFR calc Af Amer: 44 mL/min/{1.73_m2} — ABNORMAL LOW (ref >59)
GFR calc non Af Amer: 39 mL/min/{1.73_m2} — ABNORMAL LOW (ref >59)
Glucose: 106 mg/dL — ABNORMAL HIGH (ref 65–99)
Potassium: 4.2 mmol/L (ref 3.5–5.2)
Sodium: 142 mmol/L (ref 134–144)

## 2016-08-21 NOTE — Addendum Note (Signed)
Addended by: Milderd Meager on: 08/21/2016 01:17 PM   Modules accepted: Orders

## 2016-08-25 ENCOUNTER — Ambulatory Visit: Payer: Medicare HMO | Admitting: Dietician

## 2016-08-28 ENCOUNTER — Other Ambulatory Visit: Payer: Self-pay | Admitting: Cardiovascular Disease

## 2016-08-28 ENCOUNTER — Telehealth: Payer: Self-pay | Admitting: Internal Medicine

## 2016-08-28 NOTE — Telephone Encounter (Signed)
Lmtcb x1 for pt

## 2016-08-29 ENCOUNTER — Ambulatory Visit (INDEPENDENT_AMBULATORY_CARE_PROVIDER_SITE_OTHER): Payer: Medicare HMO | Admitting: Internal Medicine

## 2016-08-29 ENCOUNTER — Encounter: Payer: Self-pay | Admitting: Internal Medicine

## 2016-08-29 ENCOUNTER — Ambulatory Visit (INDEPENDENT_AMBULATORY_CARE_PROVIDER_SITE_OTHER)
Admission: RE | Admit: 2016-08-29 | Discharge: 2016-08-29 | Disposition: A | Discharge status: Home / Self Care | Payer: Medicare HMO | Source: Ambulatory Visit | Attending: Internal Medicine | Admitting: Internal Medicine

## 2016-08-29 VITALS — BP 140/70 | HR 66 | Ht 66.0 in | Wt 255.8 lb

## 2016-08-29 DIAGNOSIS — R0609 Other forms of dyspnea: Secondary | ICD-10-CM | POA: Diagnosis not present

## 2016-08-29 DIAGNOSIS — R0600 Dyspnea, unspecified: Secondary | ICD-10-CM

## 2016-08-29 DIAGNOSIS — R0602 Shortness of breath: Secondary | ICD-10-CM | POA: Diagnosis not present

## 2016-08-29 LAB — NITRIC OXIDE: Nitric Oxide: 28

## 2016-08-29 MED ORDER — BUDESONIDE-FORMOTEROL FUMARATE 80-4.5 MCG/ACT IN AERO: 2.0000 | INHALATION_SPRAY | Freq: Two times a day (BID) | RESPIRATORY_TRACT | 11 refills | Status: DC

## 2016-08-29 MED ORDER — BUDESONIDE-FORMOTEROL FUMARATE 80-4.5 MCG/ACT IN AERO: 2.0000 | INHALATION_SPRAY | Freq: Two times a day (BID) | RESPIRATORY_TRACT | 0 refills | Status: DC

## 2016-08-29 NOTE — Progress Notes (Signed)
LMTCB

## 2016-08-29 NOTE — Patient Instructions (Addendum)
Prilosec should be Take 30- 60 min before your first and last meals of the day   GERD (REFLUX)  is an extremely common cause of respiratory symptoms just like yours , many times with no obvious heartburn at all.    It can be treated with medication, but also with lifestyle changes including elevation of the head of your bed (ideally with 6 inch  bed blocks),  Smoking cessation, avoidance of late meals, excessive alcohol, and avoid fatty foods, chocolate, peppermint, colas, red wine, and acidic juices such as orange juice.  NO MINT OR MENTHOL PRODUCTS SO NO COUGH DROPS   USE SUGARLESS CANDY INSTEAD (Jolley ranchers or Stover's or Life Savers) or even ice chips will also do - the key is to swallow to prevent all throat clearing. NO OIL BASED VITAMINS - use powdered substitutes.   Plan A = Automatic = symbicort 80 2bid   Work on inhaler technique:  relax and gently blow all the way out then take a nice smooth deep breath back in, triggering the inhaler at same time you start breathing in.  Hold for up to 5 seconds if you can. Blow out thru nose. Rinse and gargle with water when done   Plan B = Backup Only use your albuterol as a rescue medication to be used if you can't catch your breath by resting or doing a relaxed purse lip breathing pattern.  - The less you use it, the better it will work when you need it. - Ok to use the inhaler up to 2 puffs  every 4 hours if you must but call for appointment if use goes up over your usual need - Don't leave home without it !!  (think of it like the spare tire for your car)       Please remember to go to the  x-ray department downstairs in the basement  for your tests - we will call you with the results when they are available.  Call us next week if you feel we're on the right track with the lower dose of symbicort and we'll call it in for you   Please schedule a follow up office visit in 6 weeks, call sooner if needed with full pfts on return

## 2016-08-29 NOTE — Telephone Encounter (Signed)
Spoke with pt. She has been scheduled to see MW today at 11am. Nothing further was needed.

## 2016-08-29 NOTE — Progress Notes (Signed)
Subjective:   Patient ID: [name], female    DOB: Jun 12, 1945   MRN: [id number]    Brief patient profile:  1 yowf quit smoking in 1970s with h/o asthma as child but no trouble while smoking referred to pulmonary clinic 08/25/2012 for eval of sob bu [name] after admit:   Admit date: 07/22/2012  Discharge date: 07/24/2012  Primary Care Provider: Dr Arville Care  Primary Cardiologist: Dr Rollene Fare  Discharge Diagnoses  Unstable angina   HYPERTENSION- ;poor control  CAD, 40% LAD with systolic bridging- 98% 02/29/12  PTVDP 2001, upgraded to [facility] BiV ICD Sept 2008  Cardiomyopathy, nonischemic, Bi V responder, EF >55% by 2D Oct 2013  Allergic to IV contrast  Morbid obesity  COPD (chronic obstructive pulmonary disease), asthmatic component  GERD (gastroesophageal reflux disease)  Sleep apnea, C-pap intol  ASTHMA, UNSPECIFIED, UNSPECIFIED STATUS   08/25/2012 1st pulmonary eval cc onset of sob p ET for knee surgery, variable and improved with symbicort but variably better for up to a year under the care of Dr Orvil Feil = symbicort and zytrec and nasal spray and no spiriva and no nebs  And maybe  Better p  Albuterol" if walked too fast".  Assoc with overt HB, sense of "chest congestion" but no excess mucus production  >>stop spiriva and added pepcid and delsym   09/08/2012 Follow up and Med review  Pt returns for 2 week follow up and med review  Seen for IOV for 2nd opinion for Dyspnea /Asthma vs COPD last ov . She was recommended to stop spiriva and continue on symbicort Twice daily   Added Pepcid At bedtime  For possible GERD component.  She says she feels she is doing okay. No flare in cough or wheezing.  Does have OSA but intolerant to CPAP . Wakes up tired a lot and feel fatigued through day.  Rec No change rx, med calendar done      09/08/2013 f/u ov/[name] re: obesity/ gerd/ no med cal/ symbicort 160 2bid and xopenex  Chief Complaint  Patient presents with  . Acute Visit     Pt c/o increased SOB for the past month.  She is SOB with or w/o any exertion.  "Feels like something cutting my air off".  She also c/o wheezing and cough- prod with thick, white sputum.  She is using albuterol inhaler 3-4 times daily. Has neb at home but has not used yet.   onset was gradual, pattern is persistent daily > noct x worse  over a month, really extends back a year  feels choking sensation day > noct no purulent sputum, poor hfa (see a/p) To ER 08/18/13 with neg v/q  Much better p pred Taking ppi at hs Has saba neb but not using  rec Change omeprazole to Take 30- 60 min before your first and last meals of the day  Work on inhaler technique:    If not better > Prednisone 10 mg take  4 each am x 2 days,   2 each am x 2 days,  1 each am x 2 days and stop  GERD diet   10/10/2013 f/u ov/[name] re: MO/ ? Asthma on symbicort 160 2bid and prn proair Chief Complaint  Patient presents with  . Follow-up    PFT done today. Breathing may be slightly improved. Using albuterol on average oncce per day.   [employer] walking leans on cart due to sob Also fatigued / not able to tolerate cpap by Dr  Wolicki rec Try bisoprolol 5 mg one daily and stop the metaprolol - call me between now and your next visit with Dr C if your blood pressure isn't doing great. Try symbicort 80 Take 2 puffs first thing in am and then another 2 puffs about 12 hours later to see if notice any change in resp symptoms and if not this will be your new maintenance dose    08/29/2016  Acute ov/[name] re-establish re: sob ? Etiology  Chief Complaint  Patient presents with  . Acute Visit    Increased SOB x 2 months. She states that she gets out of breath just walking around her apartment. She also feels SOB at rest occ "feels like something cute my air off".  She has occ wheezing and occ chest tightness. She has a cough mainly only in the am- prod with thick, white sputum. She takes mucinex daily. She uses her albuterol inhaler 1 x  daily on average.   better p heart surgery but then several months prior to [place] more doe  Abrupt sob at rest then resolves after a few minutes/ sometime a eats - no def consistent resp to [employer] ok L side down / 2 pillow orthopnea  Cards w/u neg  prilosec 30 min ac in am and more often takes the second dose  after supper     No obvious day to day or daytime variability or assoc excess/ purulent sputum or mucus plugs or hemoptysis or cp or chest tightness, subjective wheeze or overt sinus or hb symptoms. No unusual exp hx or h/o childhood pna/ asthma or knowledge of premature birth.  Sleeping ok without nocturnal  or early am exacerbation  of respiratory  c/o's or need for noct saba. Also denies any obvious fluctuation of symptoms with weather or environmental changes or other aggravating or alleviating factors except as outlined above   Current Medications, Allergies, Complete Past Medical History, Past Surgical History, Family History, and Social History were reviewed in [employer] record.  ROS  The following are not active complaints unless bolded sore throat, dysphagia, dental problems, itching, sneezing,  nasal congestion or excess/ purulent secretions, ear ache,   fever, chills, sweats, unintended wt loss, classically pleuritic or exertional cp,  orthopnea pnd or leg swelling, presyncope, palpitations, abdominal pain, anorexia, nausea, vomiting, diarrhea  or change in bowel or bladder habits, change in stools or urine, dysuria,hematuria,  rash, arthralgias, visual complaints, headache, numbness, weakness or ataxia or problems with walking or coordination,  change in mood/affect or memory.               Objective:   Physical Exam   Elderly obese wf  nad   08/29/2016       256  10/10/2013       243     09/08/13 248 lb (112.492 kg)  09/07/13 243 lb 4.8 oz (110.36 kg)  09/01/13 249 lb 12.8 oz (113.309 kg)      vital signs reviewed  - Note on arrival 02  sats  93% on RA     HEENT: nl dentition, turbinates bilaterally, and oropharynx. Nl external ear canals without cough reflex   NECK :  without JVD/Nodes/TM/ nl carotid upstrokes bilaterally   LUNGS: no acc muscle use,  Nl contour chest which is clear to A and P bilaterally without cough on insp or exp maneuvers   CV:  RRR  no s3 or murmur or increase in P2, and  1+ pitting bilateral lower ext edema   ABD:  soft and nontender with nl inspiratory excursion in the supine position. No bruits or organomegaly appreciated, bowel sounds nl  MS:  Nl gait/ ext warm without deformities, calf tenderness, cyanosis or clubbing No obvious joint restrictions   SKIN: warm and dry without lesions    NEURO:  alert, approp, nl sensorium with  no motor or cerebellar deficits apparent.       CXR PA and Lateral:   08/29/2016 :    I personally reviewed images and agree with radiology impression as follows:    No active cardiopulmonary disease    Labs   reviewed:       Chemistry      Component Value Date/Time   NA 142 08/19/2016 1107   K 4.2 08/19/2016 1107   CL 93 (L) 08/19/2016 1107   CO2 31 (H) 08/19/2016 1107   BUN 27 08/19/2016 1107   CREATININE 1.37 (H) 08/19/2016 1107   CREATININE 1.05 04/04/2014 1014      Component Value Date/Time   CALCIUM 9.2 08/19/2016 1107   ALKPHOS 118 03/04/2016 1824   AST 25 03/04/2016 1824   ALT 30 03/04/2016 1824   BILITOT 0.6 03/04/2016 1824        Lab Results  Component Value Date   WBC 5.2 03/06/2016   HGB 15.3 (H) 03/06/2016   HCT 47.0 (H) 03/06/2016   MCV 88.5 03/06/2016   PLT 138 (L) 03/06/2016       Lab Results  Component Value Date   TSH 3.677 02/21/2015     Lab Results  Component Value Date   PROBNP 66.0 09/01/2013       Lab Results  Component Value Date   ESRSEDRATE 39 (H) 04/29/2015             Assessment & Plan:

## 2016-08-30 NOTE — Assessment & Plan Note (Signed)
Body mass index is 41.29 kg/m.  -  trending  Up further  Lab Results  Component Value Date   TSH 3.677 02/21/2015     Contributing to gerd risk/ doe/reviewed the need and the process to achieve and maintain neg calorie balance > defer f/u primary care including intermittently monitoring thyroid status

## 2016-08-30 NOTE — Assessment & Plan Note (Addendum)
.10/10/2013  Walked RA @ nl pace x  2 laps @ 185 ft each stopped due to  Sob, no desat -  08/29/2016   Walked RA  2 laps @ 185 ft each stopped due to  Moderately fast pace and sob/ sat 91%  - Spirometry 08/29/2016  FEV1 1.23 (51%)  Ratio 86  with no  curvature  On symbicort 160 x 2 but poor hfa - FENO 08/29/2016  =   28 on symb 160 with poor hfa - 08/29/2016  After extensive coaching HFA effectiveness =    75% from a baseline of 25% so try symb 80 2bid   Symptoms are markedly disproportionate to objective findings and not clear this is actually much of a  lung problem but pt does appear to have difficult to sort out respiratory symptoms of unknown origin for which  DDX  = almost all start with A and  include Adherence, Ace Inhibitors, Acid Reflux, Active Sinus Disease, Alpha 1 Antitripsin deficiency, Anxiety masquerading as Airways dz,  ABPA,  Allergy(esp in young), Aspiration (esp in elderly), Adverse effects of meds,  Active smokers, A bunch of PE's (a small clot burden can't cause this syndrome unless there is already severe underlying pulm or vascular dz with poor reserve) plus two Bs  = Bronchiectasis and Beta blocker use..and one C= CHF     Adherence is always the initial "prime suspect" and is a multilayered concern that requires a "trust but verify" approach in every patient - starting with knowing how to use medications, especially inhalers, correctly, keeping up with refills and understanding the fundamental difference between maintenance and prns vs those medications only taken for a very short course and then stopped and not refilled.  - see hfa training   ? Acid (or non-acid) GERD > always difficult to exclude as up to 75% of pts in some series report no assoc GI/ Heartburn symptoms> rec max (24h)  acid suppression and diet restrictions/ reviewed and instructions given in writing.   ? Anxiety- usually dx of exclusion but note sob at rest resolves s rx suggesting a component of panic  ?  Allergy /asthma > feno is intermediate but there is very little other objective evidence of airways dz and hfa so poor that only small amt of symb 160 reaching airways so try a "less is more" approach with symb 80 2bid   ? A bunch of PE's > she is at risk but v/q neg the last time this dx was entertained and note hc03 rising which is more typical of ohs > no need to repeat the v/q for now  ? BB > not an issue on low dose bisoprolol  ? chf > no evidence on exam/ cxr s/p cabg with diastolic dysfunction > see last cards note 08/19/16   For now rec above changes and f/u with full pfts in 6 weeks  I had an extended discussion with the patient reviewing all relevant studies completed to date and  lasting 15 to 20 minutes of a 25 minute acute  visit    Each maintenance medication was reviewed in detail including most importantly the difference between maintenance and prns and under what circumstances the prns are to be triggered using an action plan format that is not reflected in the computer generated alphabetically organized AVS.    Please see AVS for specific instructions unique to this visit that I personally wrote and verbalized to the the pt in detail and then reviewed with  pt  by my nurse highlighting any  changes in therapy recommended at today's visit to their plan of care.

## 2016-08-31 DIAGNOSIS — G4733 Obstructive sleep apnea (adult) (pediatric): Secondary | ICD-10-CM | POA: Diagnosis not present

## 2016-08-31 DIAGNOSIS — J4530 Mild persistent asthma, uncomplicated: Secondary | ICD-10-CM | POA: Diagnosis not present

## 2016-09-01 NOTE — Progress Notes (Signed)
LMTCB

## 2016-09-03 DIAGNOSIS — H02413 Mechanical ptosis of bilateral eyelids: Secondary | ICD-10-CM | POA: Diagnosis not present

## 2016-09-05 NOTE — Progress Notes (Signed)
Spoke with pt and notified of results per Dr. Wert. Pt verbalized understanding and denied any questions. 

## 2016-09-14 IMAGING — CT CT ABD-PELV W/O CM
1 of 2 series · 15 of 32 positions shown, 19 images · non-contrast
Comparison: CT abdomen pelvis of 06/25/2014

CLINICAL DATA: Left-sided abdomen and flank pain for 2 weeks, micro
hematuria, no history kidney stone

EXAM:
CT ABDOMEN AND PELVIS WITHOUT CONTRAST
TECHNIQUE: Multidetector CT imaging of the abdomen and pelvis was performed
following the standard protocol without IV contrast.

[Series 2: abd/pelvis w/(date) · axial · 0.97mm/px · z∈[-428,-23]mm · 15 of 89 slices shown, 19 images]
[im 4/89  soft-tissue]
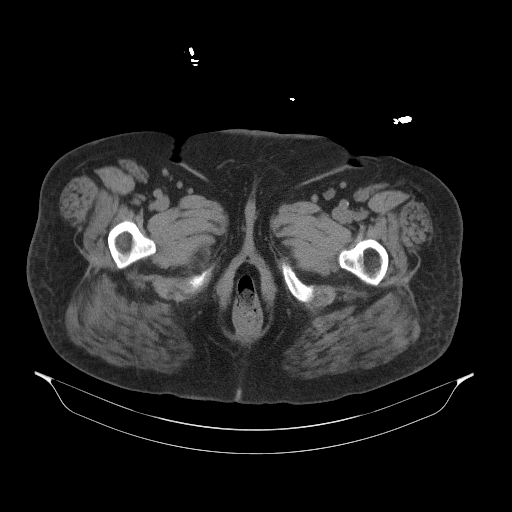
[im 4/89  bone]
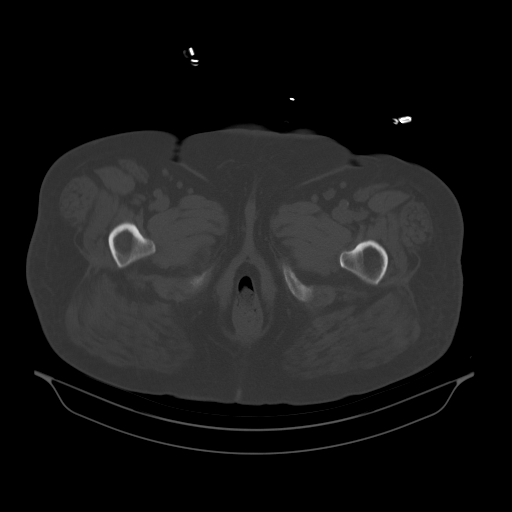
[im 11/89  soft-tissue]
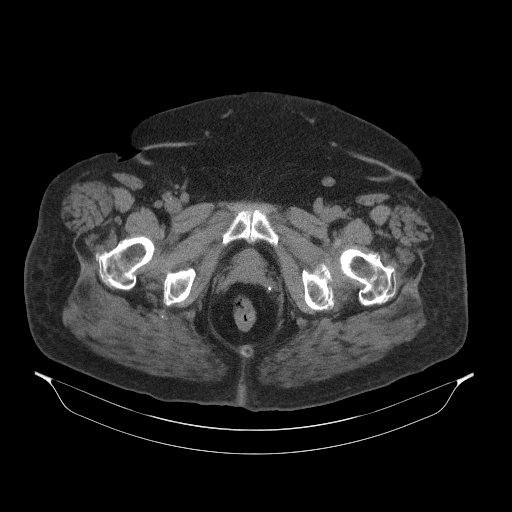
[im 18/89  soft-tissue]
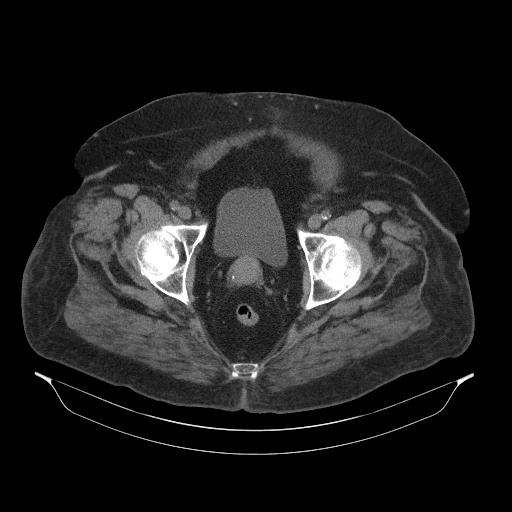
[im 25/89  soft-tissue]
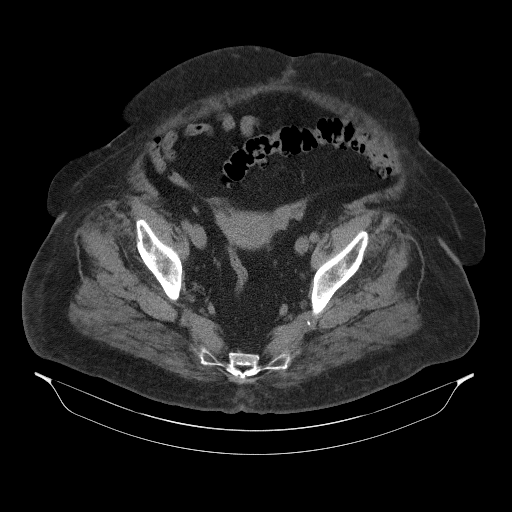
[im 32/89  soft-tissue]
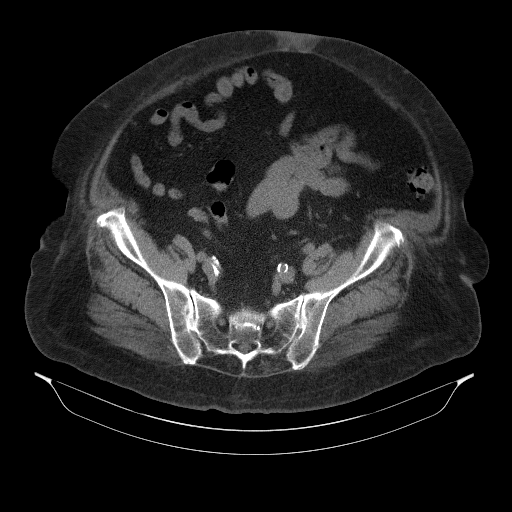
[im 39/89  soft-tissue]
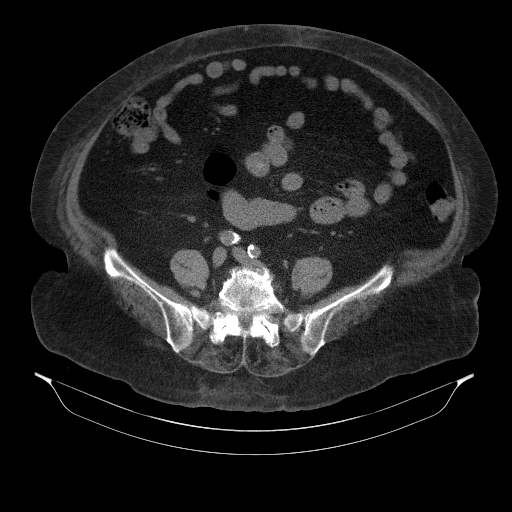
[im 46/89  soft-tissue]
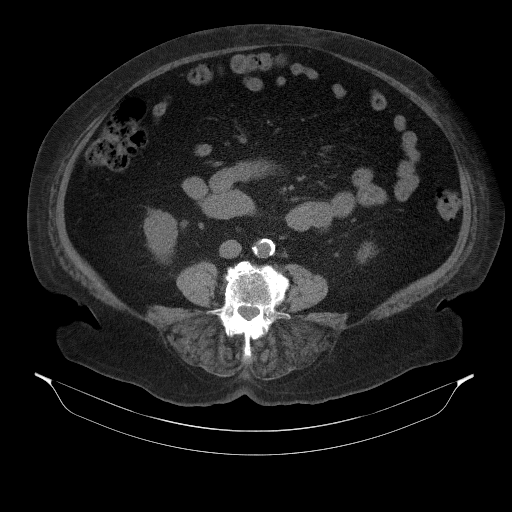
[im 50/89  soft-tissue]
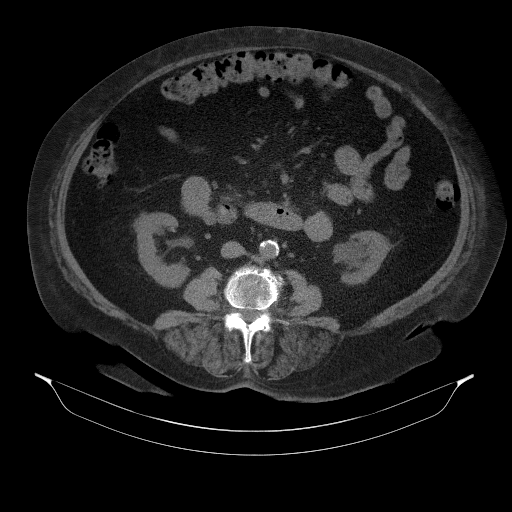
[im 57/89  soft-tissue]
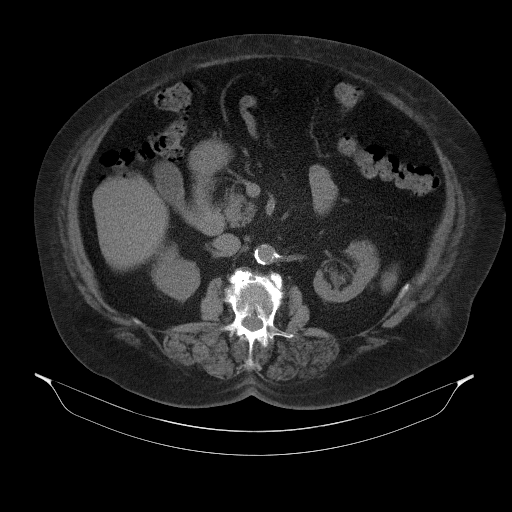
[im 57/89  bone]
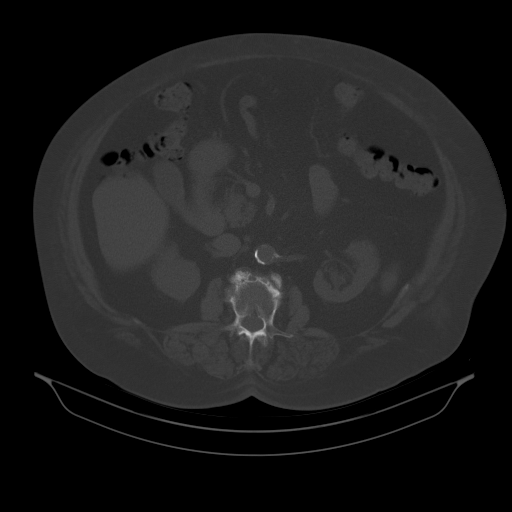
[im 64/89  soft-tissue]
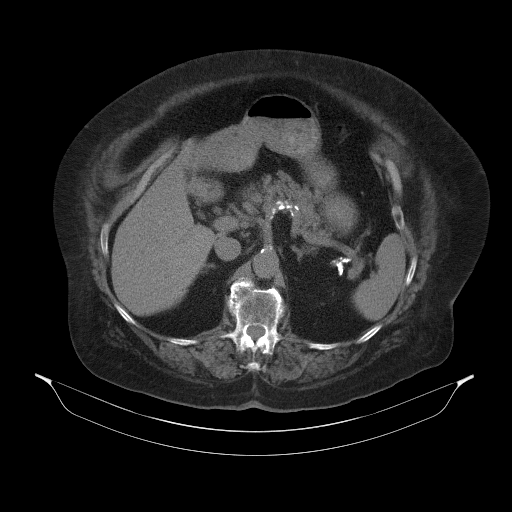
[im 71/89  soft-tissue]
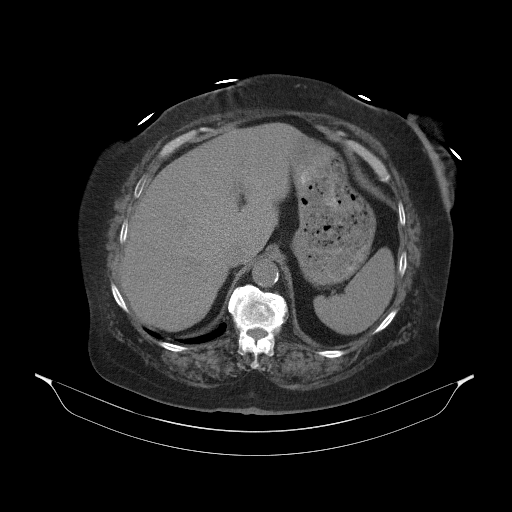
[im 74/89  lung]
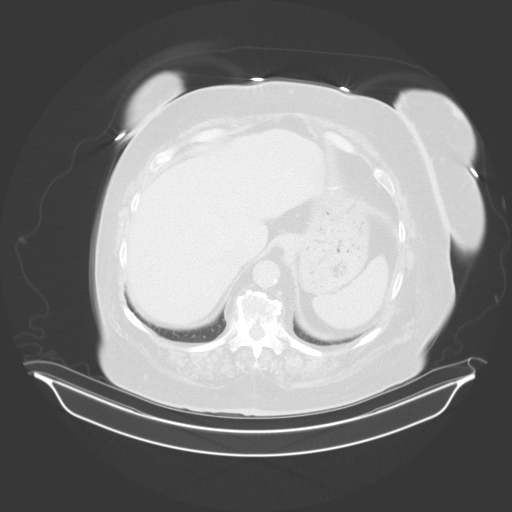
[im 78/89  soft-tissue]
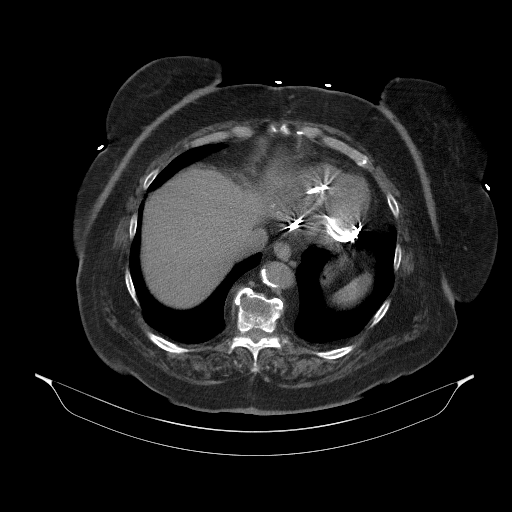
[im 78/89  lung]
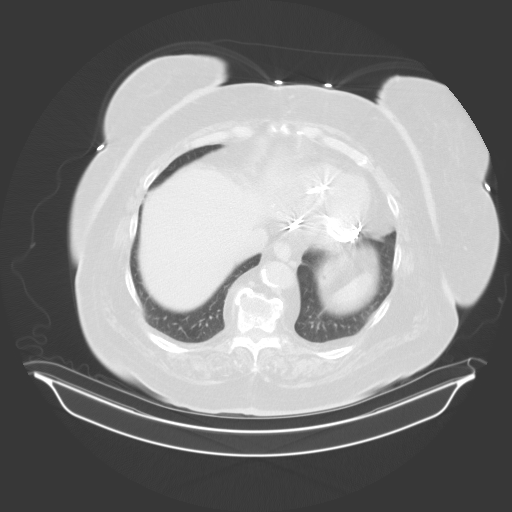
[im 81/89  lung]
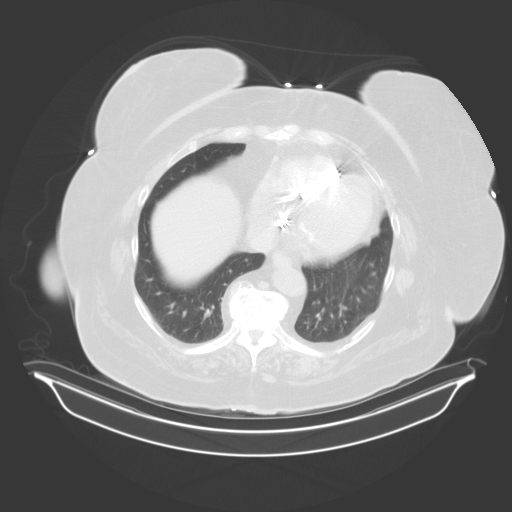
[im 85/89  soft-tissue]
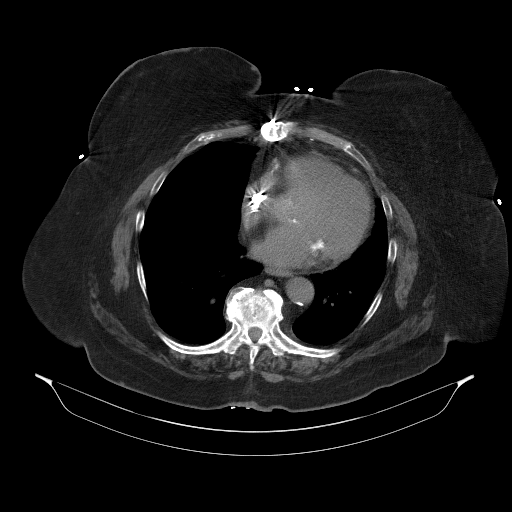
[im 85/89  lung]
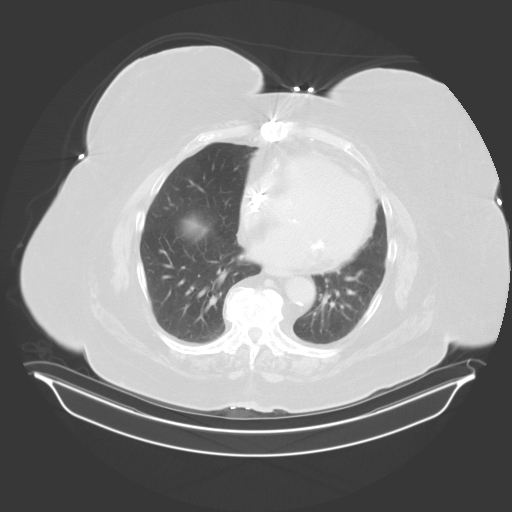

[15 of 32 positions shown; findings below may reference images not displayed]

FINDINGS: The lung bases are clear. Median sternotomy sutures are present. The
liver is unremarkable in the unenhanced state. No calcified
gallstones are seen. The pancreas is normal in size and the
pancreatic duct is not dilated. The adrenal glands and spleen are
unremarkable. The stomach is distended with food debris and fluid.
No renal calculi are seen and there is no evidence of
hydronephrosis. Parapelvic cysts are noted primarily on the left. A
small low-attenuation exophytic lesion on the left of 11 mm in
diameter appears unchanged and most consistent with a tiny cyst but
difficult to assess on this unenhanced study. The proximal ureters
are normal in caliber. Abdominal aortic atherosclerosis is present.

The urinary bladder is not well distended but no abnormality is
seen. The distal ureters are normal in caliber and no distal
ureteral calculus is noted. The uterus is normal in size. No adnexal
lesion is seen. No free fluid is seen within the pelvis. Multiple
colonic diverticula present primarily in the descending and
rectosigmoid colon. No diverticulitis is noted. The terminal ileum
is unremarkable. The appendix has previously been resected. Diffuse
degenerative disc disease is noted throughout the lumbar spine with
slight anterolisthesis of L5 on S1.
IMPRESSION: 1. No explanation for the patient's left flank pain and micro
hematuria is seen. No renal or ureteral calculi are noted. No
abnormality of the urinary bladder is evident by CT.
2. Abdominal aortic atherosclerosis.
3. Multiple colonic diverticula.  No evidence of diverticulitis.
4. Degenerative disc disease diffusely.

## 2016-09-18 ENCOUNTER — Emergency Department (HOSPITAL_COMMUNITY)
Admission: EM | Admit: 2016-09-18 | Discharge: 2016-09-19 | Disposition: A | Discharge status: Home / Self Care | Payer: Medicare HMO | Attending: Emergency Medicine | Admitting: Emergency Medicine

## 2016-09-18 DIAGNOSIS — E119 Type 2 diabetes mellitus without complications: Secondary | ICD-10-CM | POA: Diagnosis not present

## 2016-09-18 DIAGNOSIS — Z7902 Long term (current) use of antithrombotics/antiplatelets: Secondary | ICD-10-CM | POA: Diagnosis not present

## 2016-09-18 DIAGNOSIS — I2510 Atherosclerotic heart disease of native coronary artery without angina pectoris: Secondary | ICD-10-CM | POA: Insufficient documentation

## 2016-09-18 DIAGNOSIS — J45909 Unspecified asthma, uncomplicated: Secondary | ICD-10-CM | POA: Insufficient documentation

## 2016-09-18 DIAGNOSIS — Z950 Presence of cardiac pacemaker: Secondary | ICD-10-CM | POA: Diagnosis not present

## 2016-09-18 DIAGNOSIS — I5032 Chronic diastolic (congestive) heart failure: Secondary | ICD-10-CM | POA: Insufficient documentation

## 2016-09-18 DIAGNOSIS — I129 Hypertensive chronic kidney disease with stage 1 through stage 4 chronic kidney disease, or unspecified chronic kidney disease: Secondary | ICD-10-CM | POA: Diagnosis not present

## 2016-09-18 DIAGNOSIS — N183 Chronic kidney disease, stage 3 (moderate): Secondary | ICD-10-CM | POA: Diagnosis not present

## 2016-09-18 DIAGNOSIS — Z7982 Long term (current) use of aspirin: Secondary | ICD-10-CM | POA: Diagnosis not present

## 2016-09-18 DIAGNOSIS — Z8673 Personal history of transient ischemic attack (TIA), and cerebral infarction without residual deficits: Secondary | ICD-10-CM | POA: Diagnosis not present

## 2016-09-18 DIAGNOSIS — Z951 Presence of aortocoronary bypass graft: Secondary | ICD-10-CM | POA: Insufficient documentation

## 2016-09-18 DIAGNOSIS — Z87891 Personal history of nicotine dependence: Secondary | ICD-10-CM | POA: Diagnosis not present

## 2016-09-18 DIAGNOSIS — J441 Chronic obstructive pulmonary disease with (acute) exacerbation: Secondary | ICD-10-CM | POA: Insufficient documentation

## 2016-09-18 DIAGNOSIS — Z79899 Other long term (current) drug therapy: Secondary | ICD-10-CM | POA: Insufficient documentation

## 2016-09-18 DIAGNOSIS — R0602 Shortness of breath: Secondary | ICD-10-CM | POA: Diagnosis not present

## 2016-09-18 DIAGNOSIS — R069 Unspecified abnormalities of breathing: Secondary | ICD-10-CM | POA: Diagnosis not present

## 2016-09-18 NOTE — ED Triage Notes (Signed)
Pt arrived [facility] with reports of intermittent shortness of breath worsening over the last few days. 2 Duo nebs given PTA,

## 2016-09-18 NOTE — ED Provider Notes (Signed)
[facility] DEPT Provider Note   CSN: [id number] Arrival date & time: 09/18/16  2302   By signing my name below, I, [name], attest that this documentation has been prepared under the direction and in the presence of Jola Schmidt, MD. Electronically signed, [name], ED Scribe. 09/18/16. 11:37 PM.  History   Chief Complaint Chief Complaint  Patient presents with  . Shortness of Breath   The history is provided by the patient and medical records. No language interpreter was used.    [name] is a 71 y.o. female with h/o asthma, GERD, CAD, CABG, HTN, CHF, DM and ICD placement presenting to the Emergency Department concerning acute on chronic SOB onset ~9 PM this evening. Pt states she ae barbecue and a klondike bar and began to feel chest tightness, flushed sensation and generalized tingling. She also reports some R sided neck soreness. Pt also notes gradually worsening SOB x 2 weeks. She states she had an asthma attack at the beach at that time and has been experiencing worsening SOB on exertion. Pt states she took albuterol at home and was given a breathing treatment that provided relief despite mild lingering chest tightness. No known leg swelling significant from baseline. No other complaints at this time.   Past Medical History:  Diagnosis Date  . Anemia   . Arthritis   . Asthma   . CAD (coronary artery disease)   . Cellulitis and abscess of foot 12/2014   RT FOOT  . CHF (congestive heart failure) ([place])   . Diabetes mellitus without complication ([place])   . Diverticulosis   . Facial numbness 02/2016  . Fatty liver disease, nonalcoholic   . Gastritis   . GERD (gastroesophageal reflux disease)   . H/O hiatal hernia   . Hx of CABG   . Hyperplastic colon polyp   . Hypertension   . IBS (irritable bowel syndrome)   . ICD (implantable cardiac defibrillator) in place   . Internal hemorrhoids   . Ischemic cardiomyopathy   . Obesity   . OSA (obstructive  sleep apnea)   . Shortness of breath     Patient Active Problem List   Diagnosis Date Noted  . Right facial numbness 03/05/2016  . CKD (chronic kidney disease), stage III 03/05/2016  . Atrial fibrillation ([place]) 10/24/2015  . Chest pain with moderate risk of acute coronary syndrome 10/23/2015  . TIA (transient ischemic attack) 10/22/2015  . Complete heart block ([place]) 06/22/2015  . Allergic drug rash due to anti-infective agent 04/29/2015  . Fatigue 02/14/2015  . Chronic diastolic heart failure ([place]) 02/14/2015  . Cellulitis 12/21/2014  . S/P CABG x 5 11/29/2014  . CAD, multiple vessel   . Acute diastolic HF (heart failure) ([place]) 11/23/2014  . Diarrhea 07/12/2014  . Generalized abdominal pain 07/12/2014  . Diastolic CHF, acute on chronic (HCC) 11/04/2013  . Mixed hypercholesterolemia and hypertriglyceridemia 04/22/2013  . Vertigo 04/22/2013  . Dyspnea on exertion 08/25/2012  . Allergic to IV contrast 07/23/2012  . Unstable angina ([place]) 07/22/2012  . Morbid (severe) obesity due to excess calories ([place]) 11/22/2011  . Nonischemic cardiomyopathy ([place]) 11/22/2011  . GERD (gastroesophageal reflux disease) 11/22/2011  . Back pain 11/22/2011  . OSA (obstructive sleep apnea) 11/22/2011  . HEMORRHOIDS-EXTERNAL 02/21/2010  . NAUSEA 02/21/2010  . ABDOMINAL PAIN -GENERALIZED 02/21/2010  . PERSONAL HX COLONIC POLYPS 02/21/2010  . ANEMIA 04/16/2007  . Essential hypertension 04/16/2007  . DIVERTICULOSIS, COLON 04/16/2007  . ARTHRITIS 04/16/2007  . INTERNAL HEMORRHOIDS WITHOUT  MENTION COMP 04/12/2007  . Asthma 04/12/2007  . Cardiac resynchronization therapy pacemaker (CRT-P) in place 04/12/2007  . ACUTE GASTRITIS WITHOUT MENTION OF HEMORRHAGE 12/14/2006  . COLONIC POLYPS, HYPERPLASTIC 09/27/2002  . FATTY LIVER DISEASE 02/16/2002    Past Surgical History:  Procedure Laterality Date  . ABDOMINAL ULTRASOUND  12/01/2011   Peripelvic cysts- #1- 2.4x1.9x2.3cm, #2-1.2x0.9x1.2cm  . ANKLE  SURGERY     right  . APPENDECTOMY    . BIV PACEMAKER GENERATOR CHANGE OUT N/A 11/02/2012   Procedure: BIV PACEMAKER GENERATOR CHANGE OUT;  Surgeon: Sanda Klein, MD;  Location: [facility];  Service: Cardiovascular;  Laterality: N/A;  . CARDIAC CATHETERIZATION  05/17/1999   No significant coronary obstructive disease w/ mild 20% luminal irregularity of the first diag branch of the LAD  . CARDIAC CATHETERIZATION  07/08/2002   No significant CAD, moderately depressed LV systolic function  . CARDIAC CATHETERIZATION Bilateral 04/26/2007   Normal findings, recommend medical therapy  . CARDIAC CATHETERIZATION  02/18/2008   Moderate CAD, would benefit from having a functional study, recommend continue medical therapy  . CARDIAC CATHETERIZATION  07/23/2012   Medical therapy  . CARDIAC CATHETERIZATION N/A 11/24/2014   Procedure: Left Heart Cath and Coronary Angiography;  Surgeon: Troy Sine, MD;  Location: [facility];  Service: Cardiovascular;  Laterality: N/A;  . CARDIAC CATHETERIZATION  11/27/2014   Procedure: Intravascular Pressure Wire/FFR Study;  Surgeon: Peter M Martinique, MD;  Location: [facility];  Service: Cardiovascular;;  . CARDIAC DEFIBRILLATOR PLACEMENT    . CERVICAL SPINE SURGERY     plate   . CORONARY ANGIOGRAM  2010  . CORONARY ARTERY BYPASS GRAFT N/A 11/29/2014   Procedure: CORONARY ARTERY BYPASS GRAFTING x 5 (LIMA-LAD, SVG-D, SVG-OM1-OM2, SVG-PD);  Surgeon: Melrose Nakayama, MD;  Location: [facility];  Service: Open Heart Surgery;  Laterality: N/A;  . KNEE SURGERY     right  . LEFT HEART CATHETERIZATION WITH CORONARY ANGIOGRAM N/A 07/23/2012   Procedure: LEFT HEART CATHETERIZATION WITH CORONARY ANGIOGRAM;  Surgeon: Leonie Man, MD;  Location: [facility] CATH [facility];  Service: Cardiovascular;  Laterality: N/A;  Carlton Adam MYOVIEW  11/14/2011   Mild-moderate defect seen in Mid Inferolateral and Mid Anterolateral regions-consistant w/ infarct/scar. No significant ischemia  demonstrated.  [name] PACEMAKER INSERTION  2001   BS BivICD  . TEE WITHOUT CARDIOVERSION N/A 11/29/2014   Procedure: TRANSESOPHAGEAL ECHOCARDIOGRAM (TEE);  Surgeon: Melrose Nakayama, MD;  Location: [facility];  Service: Open Heart Surgery;  Laterality: N/A;  . TRANSTHORACIC ECHOCARDIOGRAM  07/23/2012   EF 55-60%, normal-mild  . TUBAL LIGATION      OB History    No data available       Home Medications    Prior to Admission medications   Medication Sig Start Date End Date Taking? Authorizing Provider  acetaminophen (TYLENOL) 500 MG tablet Take 1,000 mg by mouth every 6 (six) hours as needed (for pain.).    [provider]  aspirin EC 81 MG tablet Take 1 tablet (81 mg total) by mouth daily. 03/27/16   Erlene Quan, PA-C  bisoprolol (ZEBETA) 5 MG tablet Take 1 tablet (5 mg total) by mouth daily. 08/19/16   Croitoru, [name], MD  budesonide-formoterol (SYMBICORT) 80-4.5 MCG/ACT inhaler Inhale 2 puffs into the lungs 2 (two) times daily. 08/29/16   Tanda Rockers, MD  cetirizine (ZYRTEC) 10 MG tablet Take 10 mg by mouth daily.     [provider]  clopidogrel (PLAVIX) 75 MG tablet  Take 1 tablet (75 mg total) by mouth daily. 12/07/15   Almyra Deforest, PA  colchicine 0.6 MG tablet Take 0.6 mg by mouth 2 (two) times daily.    [provider]  EPINEPHrine 0.3 mg/0.3 mL IJ SOAJ injection Inject 0.3 mg into the muscle daily as needed (allergic reaction).  10/17/15   [provider]  fluorometholone (FML) 0.1 % ophthalmic suspension Place 1 drop into both eyes 2 (two) times daily. 08/22/15   [provider]  fluticasone (FLONASE) 50 MCG/ACT nasal spray Place 1 spray into the nose 2 (two) times daily as needed for rhinitis or allergies.     [provider]  isosorbide mononitrate (IMDUR) 30 MG 24 hr tablet TAKE 1 TABLET EVERY DAY 08/28/16   Croitoru, [name], MD  metolazone (ZAROXOLYN) 5 MG tablet Take 1 tablet (5 mg total) by mouth as needed. Take only as directed  by [facility] 07/21/16   Eileen Stanford, PA-C  omeprazole (PRILOSEC) 40 MG capsule Take 1 capsule (40 mg total) by mouth 2 (two) times daily before a meal. 07/12/15   Nandigam, Venia Minks, MD  potassium chloride SA (K-DUR,KLOR-CON) 20 MEQ tablet Take 20-40 mEq by mouth as directed. Take 1 tablet every other day alternating with 2 tablets every other day.    [provider]  rosuvastatin (CRESTOR) 20 MG tablet Take 1 tablet (20 mg total) by mouth daily. 12/28/15 06/10/18  Croitoru, [name], MD  spironolactone (ALDACTONE) 25 MG tablet Take 1 tablet (25 mg total) by mouth daily. 12/28/15   Croitoru, [name], MD  torsemide (DEMADEX) 20 MG tablet Take 2 tablets (40 mg total) by mouth daily. 08/07/16   Arbutus Leas, NP  triamcinolone cream (KENALOG) 0.1 % Apply 1 application topically daily as needed (applied to affected itchy area(s)).  08/24/14   [provider]  VENTOLIN HFA 108 (90 Base) MCG/ACT inhaler INHALE 2 PUFFS EVERY 4 HOURS AS NEEDED FOR WHEEZING 07/10/16   Rigoberto Noel, MD    Family History Family History  Problem Relation Age of Onset  . Breast cancer Mother   . Diabetes Mother   . Heart disease Maternal Grandmother   . Kidney disease Maternal Grandmother   . Diabetes Maternal Grandmother   . Glaucoma Maternal Aunt   . Heart disease Maternal Aunt   . Asthma Sister     Social History Social History  Substance Use Topics  . Smoking status: Former Smoker    Packs/day: 0.25    Years: 3.00    Types: Cigarettes    Quit date: 02/11/1968  . Smokeless tobacco: Never Used  . Alcohol use No     Allergies   Ivp dye [iodinated diagnostic agents]; Shellfish-derived products; Sulfa antibiotics; Uloric [febuxostat]; Iodine; Atorvastatin; Cephalexin; and Zithromax [azithromycin]   Review of Systems Review of Systems  Constitutional: Positive for chills. Negative for fever.  HENT: Positive for trouble swallowing. Negative for drooling.   Respiratory: Positive  for chest tightness and shortness of breath.   Skin: Negative for color change and wound.  Neurological: Positive for numbness (tingling).  All other systems reviewed and are negative.    Physical Exam Updated Vital Signs BP (!) 136/52 (BP Location: Right Arm)   Pulse 74   Temp 98 F (36.7 C) (Oral)   Resp 20   SpO2 93%   Physical Exam  Constitutional: She is oriented to person, place, and time. She appears well-developed and well-nourished. No distress.  HENT:  Head: Normocephalic and atraumatic.  Eyes: EOM are normal.  Neck: Normal range of motion.  Cardiovascular: Normal rate, regular rhythm and normal heart sounds.   Pulmonary/Chest: Effort normal and breath sounds normal.  Abdominal: Soft. She exhibits no distension. There is no tenderness.  Musculoskeletal: Normal range of motion.  Trace pitting edema bilaterally  Neurological: She is alert and oriented to person, place, and time.  Skin: Skin is warm and dry.  Psychiatric: She has a normal mood and affect. Judgment normal.  Nursing note and vitals reviewed.    ED Treatments / Results  DIAGNOSTIC STUDIES: .    COORDINATION OF CARE: 11:17 PM-Discussed next steps with blood . Pt verbalized understanding and is agreeable with the plan. Will order blood work and CT.   Labs (all labs ordered are listed, but only abnormal results are displayed) Labs Reviewed  CBC - Abnormal; Notable for the following:       Result Value   MCHC 29.9 (*)    All other components within normal limits  BASIC METABOLIC PANEL - Abnormal; Notable for the following:    Glucose, Bld 160 (*)    BUN 24 (*)    Creatinine, Ser 1.59 (*)    GFR calc non Af Amer 31 (*)    GFR calc Af Amer 36 (*)    All other components within normal limits  BRAIN NATRIURETIC PEPTIDE - Abnormal; Notable for the following:    B Natriuretic Peptide 237.8 (*)    All other components within normal limits  I-STAT TROPONIN, ED    EKG  EKG  Interpretation  Date/Time:  Thursday September 18 2016 23:04:17 EDT Ventricular Rate:  75 PR Interval:  178 QRS Duration: 170 QT Interval:  480 QTC Calculation: 536 R Axis:   -128 Text Interpretation:  Atrial-sensed ventricular-paced rhythm Abnormal ECG No significant change was found Confirmed by Jola Schmidt [id number]) on 09/18/2016 11:30:16 PM       Radiology Dg Chest 2 View  Result Date: 09/19/2016 CLINICAL DATA:  Acute onset of shortness of breath. Initial encounter. EXAM: CHEST  2 VIEW COMPARISON:  Chest radiograph performed 08/29/2016 FINDINGS: The lungs are well-aerated. Minimal bibasilar atelectasis is noted. There is no evidence of pleural effusion or pneumothorax. The heart is borderline normal in size. The patient is status post median sternotomy. A pacemaker/AICD is noted at the right chest wall, with leads ending at the right ventricle. No acute osseous abnormalities are seen. IMPRESSION: Minimal bibasilar atelectasis noted.  Lungs otherwise clear. Electronically Signed   By: Garald Balding M.D.   On: 09/19/2016 00:07    Procedures Procedures (including critical care time)  Medications Ordered in ED Medications  predniSONE (DELTASONE) tablet 60 mg (60 mg Oral Given 09/19/16 0225)  albuterol (PROVENTIL) (2.5 MG/3ML) 0.083% nebulizer solution 5 mg (5 mg Nebulization Given 09/19/16 0226)     Initial Impression / Assessment and Plan / ED Course  I have reviewed the triage vital signs and the nursing notes.  Pertinent labs & imaging results that were available during my care of the patient were reviewed by me and considered in my medical decision making (see chart for details).     3:21 AM Patient feels much better this time.  Patient be treated as a COPD exacerbation.  Doubt ACS.  Doubt PE.  Vital signs stable.  Home with short course of prednisone.  Pulmonary follow-up.  Patient understands to return to the ER for new or worsening symptoms  Final Clinical Impressions(s) / ED  Diagnoses   Final  diagnoses:  COPD exacerbation (HCC)    New Prescriptions New Prescriptions   PREDNISONE (DELTASONE) 20 MG TABLET    Take 2 tablets (40 mg total) by mouth daily.   I personally performed the services described in this documentation, which was scribed in my presence. The recorded information has been reviewed and is accurate.        Jola Schmidt, MD 09/19/16 [phone]

## 2016-09-19 ENCOUNTER — Emergency Department (HOSPITAL_COMMUNITY): Payer: Medicare HMO

## 2016-09-19 DIAGNOSIS — R0602 Shortness of breath: Secondary | ICD-10-CM | POA: Diagnosis not present

## 2016-09-19 DIAGNOSIS — J441 Chronic obstructive pulmonary disease with (acute) exacerbation: Secondary | ICD-10-CM | POA: Diagnosis not present

## 2016-09-19 LAB — BASIC METABOLIC PANEL
Anion gap: 8 (ref 5–15)
BUN: 24 mg/dL — ABNORMAL HIGH (ref 6–20)
CO2: 31 mmol/L (ref 22–32)
Calcium: 9 mg/dL (ref 8.9–10.3)
Chloride: 102 mmol/L (ref 101–111)
Creatinine, Ser: 1.59 mg/dL — ABNORMAL HIGH (ref 0.44–1.00)
GFR calc Af Amer: 36 mL/min — ABNORMAL LOW (ref >60)
GFR calc non Af Amer: 31 mL/min — ABNORMAL LOW (ref >60)
Glucose, Bld: 160 mg/dL — ABNORMAL HIGH (ref 65–99)
Potassium: 3.8 mmol/L (ref 3.5–5.1)
Sodium: 141 mmol/L (ref 135–145)

## 2016-09-19 LAB — CBC
HCT: 44.1 % (ref 36.0–46.0)
Hemoglobin: 13.2 g/dL (ref 12.0–15.0)
MCH: 27.5 pg (ref 26.0–34.0)
MCHC: 29.9 g/dL — ABNORMAL LOW (ref 30.0–36.0)
MCV: 91.9 fL (ref 78.0–100.0)
Platelets: 152 10*3/uL (ref 150–400)
RBC: 4.8 MIL/uL (ref 3.87–5.11)
RDW: 14.9 % (ref 11.5–15.5)
WBC: 6.7 10*3/uL (ref 4.0–10.5)

## 2016-09-19 LAB — I-STAT TROPONIN, ED: Troponin i, poc: 0.01 ng/mL (ref 0.00–0.08)

## 2016-09-19 LAB — BRAIN NATRIURETIC PEPTIDE: B Natriuretic Peptide: 237.8 pg/mL — ABNORMAL HIGH (ref 0.0–100.0)

## 2016-09-19 MED ORDER — ALBUTEROL SULFATE (2.5 MG/3ML) 0.083% IN NEBU
5.0000 mg | INHALATION_SOLUTION | Freq: Once | RESPIRATORY_TRACT | Status: AC
  Administered 2016-09-19: 5 mg via RESPIRATORY_TRACT
  Filled 2016-09-19: qty 6

## 2016-09-19 MED ORDER — PREDNISONE 20 MG PO TABS
60.0000 mg | ORAL_TABLET | Freq: Once | ORAL | Status: AC
Start: 2016-09-19 — End: 2016-09-19
  Administered 2016-09-19: 60 mg via ORAL
  Filled 2016-09-19: qty 3

## 2016-09-19 MED ORDER — PREDNISONE 20 MG PO TABS: 40.0000 mg | ORAL_TABLET | Freq: Every day | ORAL | 0 refills | Status: AC

## 2016-09-19 NOTE — ED Notes (Signed)
Pt departed in NAD.  

## 2016-09-24 ENCOUNTER — Encounter (HOSPITAL_COMMUNITY): Payer: Self-pay | Admitting: *Deleted

## 2016-09-24 ENCOUNTER — Encounter (HOSPITAL_COMMUNITY): Payer: Self-pay | Admitting: Internal Medicine

## 2016-09-24 ENCOUNTER — Ambulatory Visit (HOSPITAL_COMMUNITY)
Admission: RE | Admit: 2016-09-24 | Discharge: 2016-09-24 | Disposition: A | Discharge status: Home / Self Care | Payer: Medicare HMO | Source: Ambulatory Visit | Attending: Internal Medicine | Admitting: Internal Medicine

## 2016-09-24 ENCOUNTER — Other Ambulatory Visit (HOSPITAL_COMMUNITY): Payer: Self-pay | Admitting: *Deleted

## 2016-09-24 VITALS — BP 115/64 | HR 72 | Wt 253.0 lb

## 2016-09-24 DIAGNOSIS — Z882 Allergy status to sulfonamides status: Secondary | ICD-10-CM | POA: Diagnosis not present

## 2016-09-24 DIAGNOSIS — Z87891 Personal history of nicotine dependence: Secondary | ICD-10-CM | POA: Insufficient documentation

## 2016-09-24 DIAGNOSIS — I130 Hypertensive heart and chronic kidney disease with heart failure and stage 1 through stage 4 chronic kidney disease, or unspecified chronic kidney disease: Secondary | ICD-10-CM | POA: Diagnosis not present

## 2016-09-24 DIAGNOSIS — I5032 Chronic diastolic (congestive) heart failure: Secondary | ICD-10-CM | POA: Diagnosis not present

## 2016-09-24 DIAGNOSIS — G4733 Obstructive sleep apnea (adult) (pediatric): Secondary | ICD-10-CM | POA: Insufficient documentation

## 2016-09-24 DIAGNOSIS — Z6841 Body Mass Index (BMI) 40.0 and over, adult: Secondary | ICD-10-CM | POA: Diagnosis not present

## 2016-09-24 DIAGNOSIS — Z91013 Allergy to seafood: Secondary | ICD-10-CM | POA: Insufficient documentation

## 2016-09-24 DIAGNOSIS — J9610 Chronic respiratory failure, unspecified whether with hypoxia or hypercapnia: Secondary | ICD-10-CM | POA: Insufficient documentation

## 2016-09-24 DIAGNOSIS — Z79899 Other long term (current) drug therapy: Secondary | ICD-10-CM | POA: Insufficient documentation

## 2016-09-24 DIAGNOSIS — K219 Gastro-esophageal reflux disease without esophagitis: Secondary | ICD-10-CM | POA: Insufficient documentation

## 2016-09-24 DIAGNOSIS — Z7982 Long term (current) use of aspirin: Secondary | ICD-10-CM | POA: Insufficient documentation

## 2016-09-24 DIAGNOSIS — R0602 Shortness of breath: Secondary | ICD-10-CM

## 2016-09-24 DIAGNOSIS — Z833 Family history of diabetes mellitus: Secondary | ICD-10-CM | POA: Diagnosis not present

## 2016-09-24 DIAGNOSIS — N183 Chronic kidney disease, stage 3 (moderate): Secondary | ICD-10-CM | POA: Insufficient documentation

## 2016-09-24 DIAGNOSIS — I2510 Atherosclerotic heart disease of native coronary artery without angina pectoris: Secondary | ICD-10-CM

## 2016-09-24 DIAGNOSIS — J45909 Unspecified asthma, uncomplicated: Secondary | ICD-10-CM | POA: Diagnosis not present

## 2016-09-24 DIAGNOSIS — R079 Chest pain, unspecified: Secondary | ICD-10-CM

## 2016-09-24 DIAGNOSIS — I25119 Atherosclerotic heart disease of native coronary artery with unspecified angina pectoris: Secondary | ICD-10-CM

## 2016-09-24 DIAGNOSIS — Z888 Allergy status to other drugs, medicaments and biological substances status: Secondary | ICD-10-CM | POA: Diagnosis not present

## 2016-09-24 DIAGNOSIS — I5033 Acute on chronic diastolic (congestive) heart failure: Secondary | ICD-10-CM | POA: Diagnosis not present

## 2016-09-24 DIAGNOSIS — Z803 Family history of malignant neoplasm of breast: Secondary | ICD-10-CM | POA: Diagnosis not present

## 2016-09-24 DIAGNOSIS — Z841 Family history of disorders of kidney and ureter: Secondary | ICD-10-CM | POA: Insufficient documentation

## 2016-09-24 NOTE — Patient Instructions (Addendum)
You have been scheduled for a Right and Left Heart Catheterization with Dr. Haroldine Laws.  Please see attached instructions.  Home oxygen has been ordered for you, [facility] will contact you for initial setup.   Please follow up on bariatric referral, see attached information.   Follow up in 2 Months

## 2016-09-24 NOTE — Progress Notes (Addendum)
SATURATION QUALIFICATIONS: (This note is used to comply with regulatory documentation for home oxygen)  Patient Saturations on Room Air at Rest = 90%  Patient Saturations on Room Air while Ambulating = 86%  Patient Saturations on 2 Liters of oxygen while Ambulating = 96%  Please briefly explain why patient needs home oxygen:  Patient sats drop below 90 while ambulating.

## 2016-09-24 NOTE — Progress Notes (Signed)
ADVANCED HF CLINIC NOTE  Patient ID: EVAMAE ROWEN, female   DOB: [birth year], 71 y.o.   MRN: [id number]  Primary Cardiologist: Dr. Dani Gobble Croitoru Primary HF: Dr. Haroldine Laws   HPI: Ms Brick is a 71 year old with history of morbid obesity, DM, HTN, asthma, OSA, ICD, GERD, arthritis, CRT-D 2008 [employer], CAD, S/P CABG x5 11/29/2014.  Discharged from [facility] 12/07/14 s/p CABG. Discharge weight 252.  Went to [facility].  Readmitted 12/21/14 with RLE cellulitis and volume overload with weight of 273 and complaints of dyspnea and orthopnea. RLE cellulitis treated with IV ABX. She was had brisk diuresis with lasix gtt. Diuresis slowed down with slight Cr bump. Acetazolamide added and monitored closely with history of sulfa allergy. Diuresed well, but lasix held with continued Cr bump. Transitioned to po torsemide for d/c. Discharge weight 246 lbs.  Admitted 1/23 through 03/09/16 with CP. CEs negative. She did not require a heart craterization.   Ms. Vondra returns today for HF follow up. Seen recently by Dr. Recardo Evangelist. Was complaining of persistent, severe SOB. Volume status felt not to be elevated. Felt to be due to obesity and OHS. Carvedilol switched to bisoprolol. Also seen by Dr. Melvyn Novas who felt symptoms out of proportion to objective findings.   Here for f/u. Remains very SOB with any activity. Was seen in ED 09/18/16 for worsening SOB and CP. BNP 237 (baseline 103) . Trop 0.01 . CXR ok. Improved with prednisone x 4 days. Says that she likes to drink a lot fluid but tries to drink less than 2L per day. Weight stable around 244. Takes extra torsemide when weight goes up to 247 or 248. Has been unable to los weight. SOB with any activity.  Unable to lie flat in bed. Unable to tolerate CPAP. + frequent wheezing. Still with frequent CP. Recently started on Imdur.    Echo 11/24/14 EF 55-60%, Grade 1 DD. Mild TR TEE 11/29/14 EF 55-60%, LVH, Mild AR and MR Echo 9/17: EF 55-60% LVH  LHC  11/2014 Prox RCA lesion, 85% stenosed.  Mid RCA lesion, 90% stenosed.  1st Mrg lesion, 85% stenosed.  Dist Cx lesion, 60% stenosed.  Ost 1st Diag lesion, 70% stenosed.  1st Diag lesion, 70% stenosed.  Ost 2nd Diag to 2nd Diag lesion, 70% stenosed.  Mid LAD to Dist LAD lesion, 50% stenosed.  --> underwent CABG   Labs 02/21/2015: K 4.5 Creatinine 1.39  Labs 07/12/2015: K 4.4 Creatinine 1.14 Hgb 11.5   Past Medical History:  Diagnosis Date  . Anemia   . Arthritis   . Asthma   . CAD (coronary artery disease)   . Cellulitis and abscess of foot 12/2014   RT FOOT  . CHF (congestive heart failure) ([place])   . Diabetes mellitus without complication ([place])   . Diverticulosis   . Facial numbness 02/2016  . Fatty liver disease, nonalcoholic   . Gastritis   . GERD (gastroesophageal reflux disease)   . H/O hiatal hernia   . Hx of CABG   . Hyperplastic colon polyp   . Hypertension   . IBS (irritable bowel syndrome)   . ICD (implantable cardiac defibrillator) in place   . Internal hemorrhoids   . Ischemic cardiomyopathy   . Obesity   . OSA (obstructive sleep apnea)   . Shortness of breath     Current Outpatient Prescriptions  Medication Sig Dispense Refill  . acetaminophen (TYLENOL) 500 MG tablet Take 1,000 mg by mouth every 6 (six) hours as needed (  for pain.).    [name] aspirin EC 81 MG tablet Take 1 tablet (81 mg total) by mouth daily. 90 tablet 3  . bisoprolol (ZEBETA) 5 MG tablet Take 1 tablet (5 mg total) by mouth daily. 90 tablet 3  . budesonide-formoterol (SYMBICORT) 80-4.5 MCG/ACT inhaler Inhale 2 puffs into the lungs 2 (two) times daily. 1 Inhaler 11  . cetirizine (ZYRTEC) 10 MG tablet Take 10 mg by mouth daily.     . clopidogrel (PLAVIX) 75 MG tablet Take 1 tablet (75 mg total) by mouth daily. (Patient taking differently: Take 75 mg by mouth every evening. ) 90 tablet 3  . colchicine 0.6 MG tablet Take 0.6 mg by mouth 2 (two) times daily.    [name] docusate sodium (COLACE) 100 MG  capsule Take 100 mg by mouth every evening.    [name] EPINEPHrine 0.3 mg/0.3 mL IJ SOAJ injection Inject 0.3 mg into the muscle daily as needed (allergic reaction).     . fluticasone (FLONASE) 50 MCG/ACT nasal spray Place 1 spray into both nostrils every evening.     . hydroxypropyl methylcellulose / hypromellose (ISOPTO TEARS / GONIOVISC) 2.5 % ophthalmic solution Place 1 drop into both eyes as needed for dry eyes.    . isosorbide mononitrate (IMDUR) 30 MG 24 hr tablet TAKE 1 TABLET EVERY DAY 90 tablet 1  . metolazone (ZAROXOLYN) 5 MG tablet Take 1 tablet (5 mg total) by mouth as needed. Take only as directed by Heart Failure Clinic 5 tablet 0  . omeprazole (PRILOSEC) 40 MG capsule Take 1 capsule (40 mg total) by mouth 2 (two) times daily before a meal. 60 capsule 11  . potassium chloride SA (K-DUR,KLOR-CON) 20 MEQ tablet Take 20 mEq by mouth daily.     . rosuvastatin (CRESTOR) 20 MG tablet Take 1 tablet (20 mg total) by mouth daily. 90 tablet 3  . spironolactone (ALDACTONE) 25 MG tablet Take 1 tablet (25 mg total) by mouth daily. (Patient taking differently: Take 25 mg by mouth every evening. ) 90 tablet 3  . torsemide (DEMADEX) 20 MG tablet Take 2 tablets (40 mg total) by mouth daily. 90 tablet 3  . triamcinolone cream (KENALOG) 0.1 % Apply 1 application topically daily as needed (applied to affected itchy area(s)).     . VENTOLIN HFA 108 (90 Base) MCG/ACT inhaler INHALE 2 PUFFS EVERY 4 HOURS AS NEEDED FOR WHEEZING 18 g 3   No current facility-administered medications for this encounter.     Allergies  Allergen Reactions  . Ivp Dye [Iodinated Diagnostic Agents] Shortness Of Breath    No reaction to PO contrast with non-ionic dye.06-25-2014/rsm  . Shellfish-Derived Products Anaphylaxis  . Sulfa Antibiotics Shortness Of Breath  . Uloric [Febuxostat] Nausea And Vomiting  . Iodine Hives  . Atorvastatin Other (See Comments)    Pt states "causes bilateral leg pain/cramps."  . Cephalexin Itching and  Rash    Rash & itch after Keflex  . Zithromax [Azithromycin] Rash      Social History   Social History  . Marital status: Widowed    Spouse name: N/A  . Number of children: 5  . Years of education: N/A   Occupational History  . 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 Smoking status: Former Smoker    Packs/day: 0.25    Years: 3.00    Types: Cigarettes    Quit date: 02/11/1968  . Smokeless tobacco: Never Used  . Alcohol use No  . Drug  use: No  . Sexual activity: No   Other Topics Concern  . Not on file   Social History Narrative   Widowed last year. She lives with her two sons.      Family History  Problem Relation Age of Onset  . Breast cancer Mother   . Diabetes Mother   . Heart disease Maternal Grandmother   . Kidney disease Maternal Grandmother   . Diabetes Maternal Grandmother   . Glaucoma Maternal Aunt   . Heart disease Maternal Aunt   . Asthma Sister     Vitals:   09/24/16 1020  BP: 115/64  Pulse: 72  SpO2: 90%  Weight: 253 lb (114.8 kg)   Wt Readings from Last 3 Encounters:  09/24/16 253 lb (114.8 kg)  08/29/16 255 lb 12.8 oz (116 kg)  08/19/16 249 lb (112.9 kg)    Chest Circumfrence 144 cm.   PHYSICAL EXAM: General:  Obese woman sitting on exam table. No resp difficulty HEENT: normal Neck: supple. Thick. Hard to see JVP. Doesn't appear overly elevated.Carotids 2+ bilat; no bruits. No lymphadenopathy or thryomegaly appreciated. Cor: PMI nondisplaced. Regular rate & rhythm. No rubs, gallops or murmurs. Lungs: clear Abdomen: marked central obesity soft, nontender, nondistended. No hepatosplenomegaly. No bruits or masses. Good bowel sounds. Extremities: no cyanosis, clubbing, rash, edema Neuro: alert & orientedx3, cranial nerves grossly intact. moves all 4 extremities w/o difficulty. Affect pleasant   ASSESSMENT & PLAN: 1. Chronic diastolic HF - Echo 6/56 EF 55-60%, mild LVH.  - She continues to struggle with  NYHA III-IIIB symptoms. Volume status hard to assess but doesn't appear overly elevated. Recent BNP however was slightly up from baseline. - Will continue torsemide at current dose. Use metolazone as needed - Overall I suspect dyspnea is multifactorial and may be largely related to her obesity and related restrictive lung physiology, however given increasing CP and ongoing severe dyspnea will proceed with R/L heart cath to further assess. D/w Dr. Recardo Evangelist,.  - Continue Arlyce Harman 25mg  daily.  - We continue to seek approval for cardiomems to help better manage fluid status.  - Reinforced fluid restriction to < 2 L daily, sodium restriction to less than 2000 mg daily, and the importance of daily weights.    2. CAD s/p CABG 11/2014 - Has increasing CP. Recently seen in ED and ECG and troponin negative. Will proceed with cath as above.  - Continue statin, bb, aspirin.  3. Morbid obesity - Long discussion about possible role of her obesity in her symptoms. We provided referral information for Bariatrics program. Cat will also serve as part of pre-op eval.   4. H/o heart block s/p BiV pacer/CRT-D [employer] - with malfunction of A lead and chronic penetration of V lead into pericardium.  - Stable, no change. Followed by Dr. Recardo Evangelist  5. Chronic renal failure stage III - BMET today 6. OSA:  - She cannot tolerate CPAP. 7. Chronic respiratory failure - We did hall walk in clinic and sats dropped to 87%. Increased back to 96% with 2L O2. Will order supplemental O2.  - PFTs pending with Pulmonary.   Total time spent 50 minutes. Over half that time spent discussing above.   Glori Bickers, MD   10:34 AM

## 2016-09-25 DIAGNOSIS — I10 Essential (primary) hypertension: Secondary | ICD-10-CM | POA: Diagnosis not present

## 2016-09-25 DIAGNOSIS — R269 Unspecified abnormalities of gait and mobility: Secondary | ICD-10-CM | POA: Diagnosis not present

## 2016-09-25 DIAGNOSIS — J9611 Chronic respiratory failure with hypoxia: Secondary | ICD-10-CM | POA: Diagnosis not present

## 2016-09-25 DIAGNOSIS — I5032 Chronic diastolic (congestive) heart failure: Secondary | ICD-10-CM | POA: Diagnosis not present

## 2016-09-25 DIAGNOSIS — R0602 Shortness of breath: Secondary | ICD-10-CM | POA: Diagnosis not present

## 2016-09-25 DIAGNOSIS — J4520 Mild intermittent asthma, uncomplicated: Secondary | ICD-10-CM | POA: Diagnosis not present

## 2016-09-29 ENCOUNTER — Telehealth (INDEPENDENT_AMBULATORY_CARE_PROVIDER_SITE_OTHER): Payer: Self-pay | Admitting: Orthopaedic Surgery

## 2016-09-29 NOTE — Telephone Encounter (Signed)
Please take a look at schedule and advise. Thanks.

## 2016-09-29 NOTE — Telephone Encounter (Signed)
Patient called asked for a call back concerning her foot. Patient said her foot is swollen and hurting and she can barely walk. Patient said she put ice, heat and nothing is working. The number to contact patient is [phone]

## 2016-09-30 ENCOUNTER — Encounter (INDEPENDENT_AMBULATORY_CARE_PROVIDER_SITE_OTHER): Payer: Self-pay | Admitting: Orthopaedic Surgery

## 2016-09-30 ENCOUNTER — Ambulatory Visit (INDEPENDENT_AMBULATORY_CARE_PROVIDER_SITE_OTHER): Payer: Medicare HMO

## 2016-09-30 ENCOUNTER — Ambulatory Visit (INDEPENDENT_AMBULATORY_CARE_PROVIDER_SITE_OTHER): Payer: Medicare HMO | Admitting: Orthopaedic Surgery

## 2016-09-30 VITALS — BP 124/73 | HR 75

## 2016-09-30 DIAGNOSIS — M79672 Pain in left foot: Secondary | ICD-10-CM

## 2016-09-30 MED ORDER — COLCHICINE 0.6 MG PO TABS: ORAL_TABLET | ORAL | 0 refills | Status: DC

## 2016-09-30 NOTE — Telephone Encounter (Signed)
IC patient and made appt on [place] sched

## 2016-09-30 NOTE — Addendum Note (Signed)
Addended by: Meyer Cory on: 09/30/2016 04:03 PM   Modules accepted: Orders

## 2016-09-30 NOTE — Progress Notes (Signed)
Office Visit Note   Patient: [name]           Date of Birth: Aug 06, 1945           MRN: [id number] Visit Date: 09/30/2016              Requested by: Darcus Austin, MD [address], [place] [address] PCP: Darcus Austin, MD   Assessment & Plan: Visit Diagnoses:  1. Pain in left foot     Plan: She has a boot at home she can use will send in some colchicine. Previously she took multiple tablets 1 day and started having some GI problems. We'll send in 30 tablets 1 by mouth twice a day when necessary. She will use the boot she has persistent symptoms she'll call we can obtain a CT scan or bone scan of her foot to rule out occult fracture and obtain a uric acid.  Follow-Up Instructions: No Follow-up on file.   Orders:  Orders Placed This Encounter  Procedures  . XR Foot Complete Left   No orders of the defined types were placed in this encounter.     Procedures: No procedures performed   Clinical Data: No additional findings.   Subjective: Chief Complaint  Patient presents with  . Left Foot - Pain    HPI patient had past history of foot fracture. She is developed over the last week some progressive swelling left midfoot dorsally with increased pain for last 2 days we 7 great difficulty walking she is use some heat and Tylenol with some relief. She took colchicine in the past for gout and he gave her some GI distress.  Review of Systems positive for a pacemaker COPD chronic obesity, hypertension coronary artery disease number heart failure, CABG 5 otherwise negative as it pertains history of present illness. She was diagnosed with gout with first MTP synovitis. I do not see any records or previous uric acid being drawn.   Objective: Vital Signs: BP 124/73   Pulse 75   Physical Exam  Constitutional: She is oriented to person, place, and time. She appears well-developed.  HENT:  Head: Normocephalic.  Right Ear: External ear normal.    Left Ear: External ear normal.  Eyes: Pupils are equal, round, and reactive to light.  Neck: No tracheal deviation present. No thyromegaly present.  Cardiovascular: Normal rate.   Pulmonary/Chest: Effort normal.  Abdominal: Soft.  Neurological: She is alert and oriented to person, place, and time.  Skin: Skin is warm and dry.  Psychiatric: She has a normal mood and affect. Her behavior is normal.   positive straight pacemaker chest wall healed median sternotomy. Left midfoot dorsal swelling with tenderness over the extensors. No definite ankle synovitis subtalar motion is normal. The pain was with resisted toe extension peroneal posterior tib are strong without tenderness. Over the the talonavicular joint and navicular cuboid joint with acute tenderness.  Ortho Exam  Specialty Comments:  No specialty comments available.  Imaging: No results found.   PMFS History: Patient Active Problem List   Diagnosis Date Noted  . Right facial numbness 03/05/2016  . CKD (chronic kidney disease), stage III 03/05/2016  . Atrial fibrillation ([place]) 10/24/2015  . Chest pain with moderate risk of acute coronary syndrome 10/23/2015  . TIA (transient ischemic attack) 10/22/2015  . Complete heart block ([place]) 06/22/2015  . Allergic drug rash due to anti-infective agent 04/29/2015  . Fatigue 02/14/2015  . Chronic diastolic heart failure ([place]) 02/14/2015  .  Cellulitis 12/21/2014  . S/P CABG x 5 11/29/2014  . CAD, multiple vessel   . Acute diastolic HF (heart failure) ([place]) 11/23/2014  . Diarrhea 07/12/2014  . Generalized abdominal pain 07/12/2014  . Diastolic CHF, acute on chronic (HCC) 11/04/2013  . Mixed hypercholesterolemia and hypertriglyceridemia 04/22/2013  . Vertigo 04/22/2013  . Dyspnea on exertion 08/25/2012  . Allergic to IV contrast 07/23/2012  . Unstable angina ([place]) 07/22/2012  . Morbid (severe) obesity due to excess calories ([place]) 11/22/2011  . Nonischemic cardiomyopathy ([place])  11/22/2011  . GERD (gastroesophageal reflux disease) 11/22/2011  . Back pain 11/22/2011  . OSA (obstructive sleep apnea) 11/22/2011  . HEMORRHOIDS-EXTERNAL 02/21/2010  . NAUSEA 02/21/2010  . ABDOMINAL PAIN -GENERALIZED 02/21/2010  . PERSONAL HX COLONIC POLYPS 02/21/2010  . ANEMIA 04/16/2007  . Essential hypertension 04/16/2007  . DIVERTICULOSIS, COLON 04/16/2007  . ARTHRITIS 04/16/2007  . INTERNAL HEMORRHOIDS WITHOUT MENTION COMP 04/12/2007  . Asthma 04/12/2007  . Cardiac resynchronization therapy pacemaker (CRT-P) in place 04/12/2007  . ACUTE GASTRITIS WITHOUT MENTION OF HEMORRHAGE 12/14/2006  . COLONIC POLYPS, HYPERPLASTIC 09/27/2002  . FATTY LIVER DISEASE 02/16/2002   Past Medical History:  Diagnosis Date  . Anemia   . Arthritis   . Asthma   . CAD (coronary artery disease)   . Cellulitis and abscess of foot 12/2014   RT FOOT  . CHF (congestive heart failure) ([place])   . Diabetes mellitus without complication ([place])   . Diverticulosis   . Facial numbness 02/2016  . Fatty liver disease, nonalcoholic   . Gastritis   . GERD (gastroesophageal reflux disease)   . H/O hiatal hernia   . Hx of CABG   . Hyperplastic colon polyp   . Hypertension   . IBS (irritable bowel syndrome)   . ICD (implantable cardiac defibrillator) in place   . Internal hemorrhoids   . Ischemic cardiomyopathy   . Obesity   . OSA (obstructive sleep apnea)   . Shortness of breath     Family History  Problem Relation Age of Onset  . Breast cancer Mother   . Diabetes Mother   . Heart disease Maternal Grandmother   . Kidney disease Maternal Grandmother   . Diabetes Maternal Grandmother   . Glaucoma Maternal Aunt   . Heart disease Maternal Aunt   . Asthma Sister     Past Surgical History:  Procedure Laterality Date  . ABDOMINAL ULTRASOUND  12/01/2011   Peripelvic cysts- #1- 2.4x1.9x2.3cm, #2-1.2x0.9x1.2cm  . ANKLE SURGERY     right  . APPENDECTOMY    . BIV PACEMAKER GENERATOR CHANGE OUT N/A  11/02/2012   Procedure: BIV PACEMAKER GENERATOR CHANGE OUT;  Surgeon: Sanda Klein, MD;  Location: [place] CATH LAB;  Service: Cardiovascular;  Laterality: N/A;  . CARDIAC CATHETERIZATION  05/17/1999   No significant coronary obstructive disease w/ mild 20% luminal irregularity of the first diag branch of the LAD  . CARDIAC CATHETERIZATION  07/08/2002   No significant CAD, moderately depressed LV systolic function  . CARDIAC CATHETERIZATION Bilateral 04/26/2007   Normal findings, recommend medical therapy  . CARDIAC CATHETERIZATION  02/18/2008   Moderate CAD, would benefit from having a functional study, recommend continue medical therapy  . CARDIAC CATHETERIZATION  07/23/2012   Medical therapy  . CARDIAC CATHETERIZATION N/A 11/24/2014   Procedure: Left Heart Cath and Coronary Angiography;  Surgeon: Troy Sine, MD;  Location: [place] CV LAB;  Service: Cardiovascular;  Laterality: N/A;  . CARDIAC CATHETERIZATION  11/27/2014   Procedure: Intravascular Pressure  Wire/FFR Study;  Surgeon: Peter M Martinique, MD;  Location: [place] CV LAB;  Service: Cardiovascular;;  . CARDIAC DEFIBRILLATOR PLACEMENT    . CERVICAL SPINE SURGERY     plate   . CORONARY ANGIOGRAM  2010  . CORONARY ARTERY BYPASS GRAFT N/A 11/29/2014   Procedure: CORONARY ARTERY BYPASS GRAFTING x 5 (LIMA-LAD, SVG-D, SVG-OM1-OM2, SVG-PD);  Surgeon: Melrose Nakayama, MD;  Location: [place];  Service: Open Heart Surgery;  Laterality: N/A;  . KNEE SURGERY     right  . LEFT HEART CATHETERIZATION WITH CORONARY ANGIOGRAM N/A 07/23/2012   Procedure: LEFT HEART CATHETERIZATION WITH CORONARY ANGIOGRAM;  Surgeon: Leonie Man, MD;  Location[facility] C[facility];  Service: Cardiovascular;  Laterality: N/A;  Carlton Adam MYOVIEW  11/14/2011   Mild-moderate defect seen in Mid Inferolateral and Mid Anterolateral regions-consistant w/ infarct/scar. No significant ischemia demonstrated.  [name] PACEMAKER INSERTION  2001   BS BivICD  . TEE WITHOUT CARDIOVERSION  N/A 11/29/2014   Procedure: TRANSESOPHAGEAL ECHOCARDIOGRAM (TEE);  Surgeon: Melrose Nakayama, MD;  Location: [place];  Service: Open Heart Surgery;  Laterality: N/A;  . TRANSTHORACIC ECHOCARDIOGRAM  07/23/2012   EF 55-60%, normal-mild  . TUBAL LIGATION     Social History   Occupational History  . 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 Smoking status: Former Smoker    Packs/day: 0.25    Years: 3.00    Types: Cigarettes    Quit date: 02/11/1968  . Smokeless tobacco: Never Used  . Alcohol use No  . Drug use: No  . Sexual activity: No

## 2016-10-01 DIAGNOSIS — G4733 Obstructive sleep apnea (adult) (pediatric): Secondary | ICD-10-CM | POA: Diagnosis not present

## 2016-10-01 DIAGNOSIS — J4530 Mild persistent asthma, uncomplicated: Secondary | ICD-10-CM | POA: Diagnosis not present

## 2016-10-03 ENCOUNTER — Other Ambulatory Visit (HOSPITAL_COMMUNITY): Payer: Self-pay | Admitting: Cardiology

## 2016-10-03 MED ORDER — TORSEMIDE 20 MG PO TABS: 40.0000 mg | ORAL_TABLET | Freq: Every day | ORAL | 3 refills | Status: DC

## 2016-10-03 NOTE — Addendum Note (Signed)
Addended by: Kerry Dory on: 10/03/2016 10:03 AM   Modules accepted: Orders

## 2016-10-06 ENCOUNTER — Telehealth (HOSPITAL_COMMUNITY): Payer: Self-pay | Admitting: *Deleted

## 2016-10-06 NOTE — Telephone Encounter (Signed)
Josem Kaufmann #[id number] exp. 11/08/16

## 2016-10-09 ENCOUNTER — Ambulatory Visit (HOSPITAL_COMMUNITY)
Admission: RE | Admit: 2016-10-09 | Discharge: 2016-10-09 | Disposition: A | Discharge status: Home / Self Care | Payer: Medicare HMO | Source: Ambulatory Visit | Attending: Internal Medicine | Admitting: Internal Medicine

## 2016-10-09 ENCOUNTER — Encounter (HOSPITAL_COMMUNITY)
Admission: RE | Disposition: A | Discharge status: Home / Self Care | Payer: Self-pay | Source: Ambulatory Visit | Attending: Internal Medicine

## 2016-10-09 DIAGNOSIS — I25118 Atherosclerotic heart disease of native coronary artery with other forms of angina pectoris: Secondary | ICD-10-CM

## 2016-10-09 DIAGNOSIS — Z883 Allergy status to other anti-infective agents status: Secondary | ICD-10-CM | POA: Diagnosis not present

## 2016-10-09 DIAGNOSIS — Z95810 Presence of automatic (implantable) cardiac defibrillator: Secondary | ICD-10-CM | POA: Insufficient documentation

## 2016-10-09 DIAGNOSIS — K219 Gastro-esophageal reflux disease without esophagitis: Secondary | ICD-10-CM | POA: Diagnosis not present

## 2016-10-09 DIAGNOSIS — I5032 Chronic diastolic (congestive) heart failure: Secondary | ICD-10-CM | POA: Insufficient documentation

## 2016-10-09 DIAGNOSIS — G4733 Obstructive sleep apnea (adult) (pediatric): Secondary | ICD-10-CM | POA: Diagnosis not present

## 2016-10-09 DIAGNOSIS — K760 Fatty (change of) liver, not elsewhere classified: Secondary | ICD-10-CM | POA: Insufficient documentation

## 2016-10-09 DIAGNOSIS — J9610 Chronic respiratory failure, unspecified whether with hypoxia or hypercapnia: Secondary | ICD-10-CM | POA: Diagnosis not present

## 2016-10-09 DIAGNOSIS — Z882 Allergy status to sulfonamides status: Secondary | ICD-10-CM | POA: Insufficient documentation

## 2016-10-09 DIAGNOSIS — K589 Irritable bowel syndrome without diarrhea: Secondary | ICD-10-CM | POA: Insufficient documentation

## 2016-10-09 DIAGNOSIS — N183 Chronic kidney disease, stage 3 (moderate): Secondary | ICD-10-CM | POA: Diagnosis not present

## 2016-10-09 DIAGNOSIS — Z7982 Long term (current) use of aspirin: Secondary | ICD-10-CM | POA: Diagnosis not present

## 2016-10-09 DIAGNOSIS — Z7902 Long term (current) use of antithrombotics/antiplatelets: Secondary | ICD-10-CM | POA: Diagnosis not present

## 2016-10-09 DIAGNOSIS — J45909 Unspecified asthma, uncomplicated: Secondary | ICD-10-CM | POA: Insufficient documentation

## 2016-10-09 DIAGNOSIS — I25718 Atherosclerosis of autologous vein coronary artery bypass graft(s) with other forms of angina pectoris: Secondary | ICD-10-CM | POA: Insufficient documentation

## 2016-10-09 DIAGNOSIS — I255 Ischemic cardiomyopathy: Secondary | ICD-10-CM | POA: Insufficient documentation

## 2016-10-09 DIAGNOSIS — I130 Hypertensive heart and chronic kidney disease with heart failure and stage 1 through stage 4 chronic kidney disease, or unspecified chronic kidney disease: Secondary | ICD-10-CM | POA: Diagnosis not present

## 2016-10-09 DIAGNOSIS — M1990 Unspecified osteoarthritis, unspecified site: Secondary | ICD-10-CM | POA: Insufficient documentation

## 2016-10-09 DIAGNOSIS — Z87891 Personal history of nicotine dependence: Secondary | ICD-10-CM | POA: Insufficient documentation

## 2016-10-09 DIAGNOSIS — Z6841 Body Mass Index (BMI) 40.0 and over, adult: Secondary | ICD-10-CM | POA: Insufficient documentation

## 2016-10-09 DIAGNOSIS — Z91013 Allergy to seafood: Secondary | ICD-10-CM | POA: Insufficient documentation

## 2016-10-09 DIAGNOSIS — I2582 Chronic total occlusion of coronary artery: Secondary | ICD-10-CM | POA: Insufficient documentation

## 2016-10-09 DIAGNOSIS — Z7951 Long term (current) use of inhaled steroids: Secondary | ICD-10-CM | POA: Diagnosis not present

## 2016-10-09 HISTORY — PX: CARDIAC CATHETERIZATION: SHX172

## 2016-10-09 HISTORY — PX: RIGHT/LEFT HEART CATH AND CORONARY ANGIOGRAPHY: CATH118266

## 2016-10-09 LAB — BASIC METABOLIC PANEL
Anion gap: 9 (ref 5–15)
BUN: 30 mg/dL — ABNORMAL HIGH (ref 6–20)
CO2: 29 mmol/L (ref 22–32)
Calcium: 9 mg/dL (ref 8.9–10.3)
Chloride: 102 mmol/L (ref 101–111)
Creatinine, Ser: 1.47 mg/dL — ABNORMAL HIGH (ref 0.44–1.00)
GFR calc Af Amer: 40 mL/min — ABNORMAL LOW (ref >60)
GFR calc non Af Amer: 34 mL/min — ABNORMAL LOW (ref >60)
Glucose, Bld: 132 mg/dL — ABNORMAL HIGH (ref 65–99)
Potassium: 4.2 mmol/L (ref 3.5–5.1)
Sodium: 140 mmol/L (ref 135–145)

## 2016-10-09 LAB — POCT I-STAT 3, VENOUS BLOOD GAS (G3P V)
Acid-Base Excess: 2 mmol/L (ref 0.0–2.0)
Bicarbonate: 29.1 mmol/L — ABNORMAL HIGH (ref 20.0–28.0)
O2 Saturation: 65 %
TCO2: 31 mmol/L (ref 22–32)
pCO2, Ven: 58 mmHg (ref 44.0–60.0)
pH, Ven: 7.308 (ref 7.250–7.430)
pO2, Ven: 38 mmHg (ref 32.0–45.0)

## 2016-10-09 LAB — CBC
HCT: 44.6 % (ref 36.0–46.0)
Hemoglobin: 14 g/dL (ref 12.0–15.0)
MCH: 28.6 pg (ref 26.0–34.0)
MCHC: 31.4 g/dL (ref 30.0–36.0)
MCV: 91.2 fL (ref 78.0–100.0)
Platelets: 171 10*3/uL (ref 150–400)
RBC: 4.89 MIL/uL (ref 3.87–5.11)
RDW: 14.6 % (ref 11.5–15.5)
WBC: 6.8 10*3/uL (ref 4.0–10.5)

## 2016-10-09 LAB — GLUCOSE, CAPILLARY: Glucose-Capillary: 148 mg/dL — ABNORMAL HIGH (ref 65–99)

## 2016-10-09 LAB — POCT I-STAT 3, ART BLOOD GAS (G3+)
Acid-Base Excess: 4 mmol/L — ABNORMAL HIGH (ref 0.0–2.0)
Bicarbonate: 31 mmol/L — ABNORMAL HIGH (ref 20.0–28.0)
O2 Saturation: 95 %
TCO2: 33 mmol/L — ABNORMAL HIGH (ref 22–32)
pCO2 arterial: 57.7 mmHg — ABNORMAL HIGH (ref 32.0–48.0)
pH, Arterial: 7.338 — ABNORMAL LOW (ref 7.350–7.450)
pO2, Arterial: 82 mmHg — ABNORMAL LOW (ref 83.0–108.0)

## 2016-10-09 LAB — PROTIME-INR
INR: 0.91
Prothrombin Time: 12.2 seconds (ref 11.4–15.2)

## 2016-10-09 LAB — POCT ACTIVATED CLOTTING TIME: Activated Clotting Time: 164 seconds

## 2016-10-09 SURGERY — RIGHT/LEFT HEART CATH AND CORONARY ANGIOGRAPHY
Anesthesia: LOCAL

## 2016-10-09 MED ORDER — SODIUM CHLORIDE 0.9% FLUSH: 3.0000 mL | Freq: Two times a day (BID) | INTRAVENOUS | Status: DC

## 2016-10-09 MED ORDER — LIDOCAINE HCL (PF) 1 % IJ SOLN
INTRAMUSCULAR | Status: DC | PRN
  Administered 2016-10-09: 6 mL via INTRADERMAL
  Administered 2016-10-09: 15 mL via INTRADERMAL

## 2016-10-09 MED ORDER — HEPARIN (PORCINE) IN NACL 2-0.9 UNIT/ML-% IJ SOLN
INTRAMUSCULAR | Status: AC | PRN
  Administered 2016-10-09: 1000 mL via INTRA_ARTERIAL

## 2016-10-09 MED ORDER — SODIUM CHLORIDE 0.9 % IV SOLN
INTRAVENOUS | Status: DC
  Administered 2016-10-09: 10:00:00 via INTRAVENOUS

## 2016-10-09 MED ORDER — SODIUM CHLORIDE 0.9 % IV SOLN: INTRAVENOUS | Status: AC

## 2016-10-09 MED ORDER — LIDOCAINE HCL 2 % IJ SOLN
INTRAMUSCULAR | Status: AC
  Filled 2016-10-09: qty 10

## 2016-10-09 MED ORDER — SODIUM CHLORIDE 0.9 % IV SOLN: 250.0000 mL | INTRAVENOUS | Status: DC | PRN

## 2016-10-09 MED ORDER — MIDAZOLAM HCL 2 MG/2ML IJ SOLN
INTRAMUSCULAR | Status: AC
  Filled 2016-10-09: qty 2

## 2016-10-09 MED ORDER — IOPAMIDOL (ISOVUE-370) INJECTION 76%
INTRAVENOUS | Status: DC | PRN
  Administered 2016-10-09: 115 mL via INTRA_ARTERIAL

## 2016-10-09 MED ORDER — ONDANSETRON HCL 4 MG/2ML IJ SOLN: 4.0000 mg | Freq: Four times a day (QID) | INTRAMUSCULAR | Status: DC | PRN

## 2016-10-09 MED ORDER — ACETAMINOPHEN 325 MG PO TABS: 650.0000 mg | ORAL_TABLET | ORAL | Status: DC | PRN

## 2016-10-09 MED ORDER — HEPARIN SODIUM (PORCINE) 1000 UNIT/ML IJ SOLN
INTRAMUSCULAR | Status: AC
  Filled 2016-10-09: qty 1

## 2016-10-09 MED ORDER — ASPIRIN 81 MG PO CHEW: 81.0000 mg | CHEWABLE_TABLET | ORAL | Status: DC

## 2016-10-09 MED ORDER — DIPHENHYDRAMINE HCL 50 MG/ML IJ SOLN
25.0000 mg | Freq: Once | INTRAMUSCULAR | Status: AC
  Administered 2016-10-09: 25 mg via INTRAVENOUS

## 2016-10-09 MED ORDER — FENTANYL CITRATE (PF) 100 MCG/2ML IJ SOLN
INTRAMUSCULAR | Status: AC
  Filled 2016-10-09: qty 2

## 2016-10-09 MED ORDER — MIDAZOLAM HCL 2 MG/2ML IJ SOLN
INTRAMUSCULAR | Status: DC | PRN
  Administered 2016-10-09 (×3): 1 mg via INTRAVENOUS

## 2016-10-09 MED ORDER — HEPARIN (PORCINE) IN NACL 2-0.9 UNIT/ML-% IJ SOLN
INTRAMUSCULAR | Status: AC
  Filled 2016-10-09: qty 1000

## 2016-10-09 MED ORDER — METHYLPREDNISOLONE SODIUM SUCC 125 MG IJ SOLR
INTRAMUSCULAR | Status: AC
  Filled 2016-10-09: qty 2

## 2016-10-09 MED ORDER — VERAPAMIL HCL 2.5 MG/ML IV SOLN
INTRAVENOUS | Status: DC | PRN
  Administered 2016-10-09: 11:00:00 via INTRA_ARTERIAL

## 2016-10-09 MED ORDER — FENTANYL CITRATE (PF) 100 MCG/2ML IJ SOLN
INTRAMUSCULAR | Status: DC | PRN
  Administered 2016-10-09: 25 ug via INTRAVENOUS

## 2016-10-09 MED ORDER — CLOPIDOGREL BISULFATE 75 MG PO TABS
75.0000 mg | ORAL_TABLET | Freq: Once | ORAL | Status: AC
  Administered 2016-10-09: 75 mg via ORAL

## 2016-10-09 MED ORDER — MIDAZOLAM HCL 2 MG/2ML IJ SOLN
INTRAMUSCULAR | Status: AC
Start: 2016-10-09 — End: 2016-10-09
  Filled 2016-10-09: qty 2

## 2016-10-09 MED ORDER — SODIUM CHLORIDE 0.9% FLUSH: 3.0000 mL | INTRAVENOUS | Status: DC | PRN

## 2016-10-09 MED ORDER — IOPAMIDOL (ISOVUE-370) INJECTION 76%
INTRAVENOUS | Status: AC
  Filled 2016-10-09: qty 100

## 2016-10-09 MED ORDER — CLOPIDOGREL BISULFATE 75 MG PO TABS
ORAL_TABLET | ORAL | Status: AC
  Filled 2016-10-09: qty 1

## 2016-10-09 MED ORDER — DIPHENHYDRAMINE HCL 50 MG/ML IJ SOLN
INTRAMUSCULAR | Status: AC
  Filled 2016-10-09: qty 1

## 2016-10-09 MED ORDER — VERAPAMIL HCL 2.5 MG/ML IV SOLN
INTRAVENOUS | Status: AC
  Filled 2016-10-09: qty 2

## 2016-10-09 MED ORDER — METHYLPREDNISOLONE SODIUM SUCC 125 MG IJ SOLR
125.0000 mg | Freq: Once | INTRAMUSCULAR | Status: AC
  Administered 2016-10-09: 125 mg via INTRAVENOUS

## 2016-10-09 MED ORDER — HEPARIN SODIUM (PORCINE) 1000 UNIT/ML IJ SOLN
INTRAMUSCULAR | Status: DC | PRN
  Administered 2016-10-09: 5000 [IU] via INTRAVENOUS

## 2016-10-09 SURGICAL SUPPLY — 18 items
CATH BALLN WEDGE 5F 110CM (CATHETERS) ×2 IMPLANT
CATH INFINITI 5 FR 3DRC (CATHETERS) ×2 IMPLANT
CATH INFINITI 5 FR RCB (CATHETERS) ×2 IMPLANT
CATH INFINITI 5FR AL1 (CATHETERS) ×2 IMPLANT
CATH INFINITI 5FR JL5 (CATHETERS) ×2 IMPLANT
CATH INFINITI 5FR MULTPACK ANG (CATHETERS) ×2 IMPLANT
GLIDESHEATH SLEND SS 6F .021 (SHEATH) ×2 IMPLANT
GUIDEWIRE .025 260CM (WIRE) ×2 IMPLANT
GUIDEWIRE INQWIRE 1.5J.035X260 (WIRE) ×1 IMPLANT
INQWIRE 1.5J .035X260CM (WIRE) ×2
KIT HEART LEFT (KITS) ×2 IMPLANT
PACK CARDIAC CATHETERIZATION (CUSTOM PROCEDURE TRAY) ×2 IMPLANT
SHEATH GLIDE SLENDER 4/5FR (SHEATH) ×2 IMPLANT
SHEATH PINNACLE 5F 10CM (SHEATH) ×2 IMPLANT
SYR MEDRAD MARK V 150ML (SYRINGE) ×2 IMPLANT
TRANSDUCER W/STOPCOCK (MISCELLANEOUS) ×2 IMPLANT
TUBING CIL FLEX 10 FLL-RA (TUBING) ×2 IMPLANT
WIRE EMERALD 3MM-J .035X150CM (WIRE) ×2 IMPLANT

## 2016-10-09 NOTE — Interval H&P Note (Signed)
History and Physical Interval Note:  10/09/2016 10:46 AM  [name]  has presented today for surgery, with the diagnosis of hf  The various methods of treatment have been discussed with the patient and family. After consideration of risks, benefits and other options for treatment, the patient has consented to  Procedure(s): RIGHT/LEFT HEART CATH AND CORONARY ANGIOGRAPHY (N/A) and possible coronary angioplasty as a surgical intervention .  The patient's history has been reviewed, patient examined, no change in status, stable for surgery.  I have reviewed the patient's chart and labs.  Questions were answered to the patient's satisfaction.     Bensimhon, Quillian Quince

## 2016-10-09 NOTE — H&P (View-Only) (Signed)
ADVANCED HF CLINIC NOTE  Patient ID: Alicia DRENNING, female   DOB: 1945-03-07, 71 y.o.   MRN: [id number]  Primary Cardiologist: Dr. Dani Gobble [name] Primary HF: Dr. Haroldine [name]   HPI: [name] is a 71 year old with history of morbid obesity, DM, HTN, asthma, OSA, ICD, GERD, arthritis, CRT-D 2008 [employer], CAD, S/P CABG x5 11/29/2014.  Discharged from [facility] 12/07/14 s/p CABG. Discharge weight 252.  Went to [facility].  Readmitted 12/21/14 with RLE cellulitis and volume overload with weight of 273 and complaints of dyspnea and orthopnea. RLE cellulitis treated with IV ABX. She was had brisk diuresis with lasix gtt. Diuresis slowed down with slight Cr bump. Acetazolamide added and monitored closely with history of sulfa allergy. Diuresed well, but lasix held with continued Cr bump. Transitioned to po torsemide for d/c. Discharge weight 246 lbs.  Admitted 1/23 through 03/09/16 with CP. CEs negative. She did not require a heart craterization.   [name] returns today for HF follow up. Seen recently by Dr. Recardo [name]. Was complaining of persistent, severe SOB. Volume status felt not to be elevated. Felt to be due to obesity and OHS. Carvedilol switched to bisoprolol. Also seen by Dr. Melvyn [name] who felt symptoms out of proportion to objective findings.   Here for f/u. Remains very SOB with any activity. Was seen in ED 09/18/16 for worsening SOB and CP. BNP 237 (baseline 103) . Trop 0.01 . CXR ok. Improved with prednisone x 4 days. Says that she likes to drink a lot fluid but tries to drink less than 2L per day. Weight stable around 244. Takes extra torsemide when weight goes up to 247 or 248. Has been unable to los weight. SOB with any activity.  Unable to lie flat in bed. Unable to tolerate CPAP. + frequent wheezing. Still with frequent CP. Recently started on Imdur.    Echo 11/24/14 EF 55-60%, Grade 1 DD. Mild TR TEE 11/29/14 EF 55-60%, LVH, Mild AR and MR Echo 9/17: EF 55-60% LVH  LHC  11/2014 Prox RCA lesion, 85% stenosed.  Mid RCA lesion, 90% stenosed.  1st Mrg lesion, 85% stenosed.  Dist Cx lesion, 60% stenosed.  Ost 1st Diag lesion, 70% stenosed.  1st Diag lesion, 70% stenosed.  Ost 2nd Diag to 2nd Diag lesion, 70% stenosed.  Mid LAD to Dist LAD lesion, 50% stenosed.  --> underwent CABG   Labs 02/21/2015: K 4.5 Creatinine 1.39  Labs 07/12/2015: K 4.4 Creatinine 1.14 Hgb 11.5   Past Medical History:  Diagnosis Date  . Anemia   . Arthritis   . Asthma   . CAD (coronary artery disease)   . Cellulitis and abscess of foot 12/2014   RT FOOT  . CHF (congestive heart failure) ([place])   . Diabetes mellitus without complication ([place])   . Diverticulosis   . Facial numbness 02/2016  . Fatty liver disease, nonalcoholic   . Gastritis   . GERD (gastroesophageal reflux disease)   . H/O hiatal hernia   . Hx of CABG   . Hyperplastic colon polyp   . Hypertension   . IBS (irritable bowel syndrome)   . ICD (implantable cardiac defibrillator) in place   . Internal hemorrhoids   . Ischemic cardiomyopathy   . Obesity   . OSA (obstructive sleep apnea)   . Shortness of breath     Current Outpatient Prescriptions  Medication Sig Dispense Refill  . acetaminophen (TYLENOL) 500 MG tablet Take 1,000 mg by mouth every 6 (six) hours as needed (  for pain.).    [name] aspirin EC 81 MG tablet Take 1 tablet (81 mg total) by mouth daily. 90 tablet 3  . bisoprolol (ZEBETA) 5 MG tablet Take 1 tablet (5 mg total) by mouth daily. 90 tablet 3  . budesonide-formoterol (SYMBICORT) 80-4.5 MCG/ACT inhaler Inhale 2 puffs into the lungs 2 (two) times daily. 1 Inhaler 11  . cetirizine (ZYRTEC) 10 MG tablet Take 10 mg by mouth daily.     . clopidogrel (PLAVIX) 75 MG tablet Take 1 tablet (75 mg total) by mouth daily. (Patient taking differently: Take 75 mg by mouth every evening. ) 90 tablet 3  . colchicine 0.6 MG tablet Take 0.6 mg by mouth 2 (two) times daily.    [name] docusate sodium (COLACE) 100 MG  capsule Take 100 mg by mouth every evening.    [name] EPINEPHrine 0.3 mg/0.3 mL IJ SOAJ injection Inject 0.3 mg into the muscle daily as needed (allergic reaction).     . fluticasone (FLONASE) 50 MCG/ACT nasal spray Place 1 spray into both nostrils every evening.     . hydroxypropyl methylcellulose / hypromellose (ISOPTO TEARS / GONIOVISC) 2.5 % ophthalmic solution Place 1 drop into both eyes as needed for dry eyes.    . isosorbide mononitrate (IMDUR) 30 MG 24 hr tablet TAKE 1 TABLET EVERY DAY 90 tablet 1  . metolazone (ZAROXOLYN) 5 MG tablet Take 1 tablet (5 mg total) by mouth as needed. Take only as directed by Heart Failure Clinic 5 tablet 0  . omeprazole (PRILOSEC) 40 MG capsule Take 1 capsule (40 mg total) by mouth 2 (two) times daily before a meal. 60 capsule 11  . potassium chloride SA (K-DUR,KLOR-CON) 20 MEQ tablet Take 20 mEq by mouth daily.     . rosuvastatin (CRESTOR) 20 MG tablet Take 1 tablet (20 mg total) by mouth daily. 90 tablet 3  . spironolactone (ALDACTONE) 25 MG tablet Take 1 tablet (25 mg total) by mouth daily. (Patient taking differently: Take 25 mg by mouth every evening. ) 90 tablet 3  . torsemide (DEMADEX) 20 MG tablet Take 2 tablets (40 mg total) by mouth daily. 90 tablet 3  . triamcinolone cream (KENALOG) 0.1 % Apply 1 application topically daily as needed (applied to affected itchy area(s)).     . VENTOLIN HFA 108 (90 Base) MCG/ACT inhaler INHALE 2 PUFFS EVERY 4 HOURS AS NEEDED FOR WHEEZING 18 g 3   No current facility-administered medications for this encounter.     Allergies  Allergen Reactions  . Ivp Dye [Iodinated Diagnostic Agents] Shortness Of Breath    No reaction to PO contrast with non-ionic dye.06-25-2014/rsm  . Shellfish-Derived Products Anaphylaxis  . Sulfa Antibiotics Shortness Of Breath  . Uloric [Febuxostat] Nausea And Vomiting  . Iodine Hives  . Atorvastatin Other (See Comments)    Pt states "causes bilateral leg pain/cramps."  . Cephalexin Itching and  Rash    Rash & itch after Keflex  . Zithromax [Azithromycin] Rash      Social History   Social History  . Marital status: Widowed    Spouse name: N/A  . Number of children: 5  . Years of education: N/A   Occupational History  . 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 Smoking status: Former Smoker    Packs/day: 0.25    Years: 3.00    Types: Cigarettes    Quit date: 02/11/1968  . Smokeless tobacco: Never Used  . Alcohol use No  . Drug  use: No  . Sexual activity: No   Other Topics Concern  . Not on file   Social History Narrative   Widowed last year. She lives with her two sons.      Family History  Problem Relation Age of Onset  . Breast cancer Mother   . Diabetes Mother   . Heart disease Maternal Grandmother   . Kidney disease Maternal Grandmother   . Diabetes Maternal Grandmother   . Glaucoma Maternal Aunt   . Heart disease Maternal Aunt   . Asthma Sister     Vitals:   09/24/16 1020  BP: 115/64  Pulse: 72  SpO2: 90%  Weight: 253 lb (114.8 kg)   Wt Readings from Last 3 Encounters:  09/24/16 253 lb (114.8 kg)  08/29/16 255 lb 12.8 oz (116 kg)  08/19/16 249 lb (112.9 kg)    Chest Circumfrence 144 cm.   PHYSICAL EXAM: General:  Obese woman sitting on exam table. No resp difficulty HEENT: normal Neck: supple. Thick. Hard to see JVP. Doesn't appear overly elevated.Carotids 2+ bilat; no bruits. No lymphadenopathy or thryomegaly appreciated. Cor: PMI nondisplaced. Regular rate & rhythm. No rubs, gallops or murmurs. Lungs: clear Abdomen: marked central obesity soft, nontender, nondistended. No hepatosplenomegaly. No bruits or masses. Good bowel sounds. Extremities: no cyanosis, clubbing, rash, edema Neuro: alert & orientedx3, cranial nerves grossly intact. moves all 4 extremities w/o difficulty. Affect pleasant   ASSESSMENT & PLAN: 1. Chronic diastolic HF - Echo 8/76 EF 55-60%, mild LVH.  - She continues to struggle with  NYHA III-IIIB symptoms. Volume status hard to assess but doesn't appear overly elevated. Recent BNP however was slightly up from baseline. - Will continue torsemide at current dose. Use metolazone as needed - Overall I suspect dyspnea is multifactorial and may be largely related to her obesity and related restrictive lung physiology, however given increasing CP and ongoing severe dyspnea will proceed with R/L heart cath to further assess. D/w Dr. Recardo [name],.  - Continue Arlyce Harman 25mg  daily.  - We continue to seek approval for cardiomems to help better manage fluid status.  - Reinforced fluid restriction to < 2 L daily, sodium restriction to less than 2000 mg daily, and the importance of daily weights.    2. CAD s/p CABG 11/2014 - Has increasing CP. Recently seen in ED and ECG and troponin negative. Will proceed with cath as above.  - Continue statin, bb, aspirin.  3. Morbid obesity - Long discussion about possible role of her obesity in her symptoms. We provided referral information for Bariatrics program. Cat will also serve as part of pre-op eval.   4. H/o heart block s/p BiV pacer/CRT-D [employer] - with malfunction of A lead and chronic penetration of V lead into pericardium.  - Stable, no change. Followed by Dr. Recardo [name]  5. Chronic renal failure stage III - BMET today 6. OSA:  - She cannot tolerate CPAP. 7. Chronic respiratory failure - We did hall walk in clinic and sats dropped to 87%. Increased back to 96% with 2L O2. Will order supplemental O2.  - PFTs pending with Pulmonary.   Total time spent 50 minutes. Over half that time spent discussing above.   Glori Bickers, MD   10:34 AM

## 2016-10-09 NOTE — Discharge Instructions (Signed)

## 2016-10-09 NOTE — Progress Notes (Signed)
Site area: Right groin a 5 french arterial  Sheath was removed by Sherlyn Lick [facility]  Site Prior to Removal:  Level 0  Pressure Applied For 20 MINUTES    Bedrest Beginning at 1310  Manual:   Yes.    Patient Status During Pull:  stable  Post Pull Groin Site:  Level 0  Post Pull Instructions Given:  Yes.    Post Pull Pulses Present:  Yes.    Dressing Applied:  Yes.    Comments:  VS remain stable during sheath pull

## 2016-10-10 ENCOUNTER — Ambulatory Visit: Payer: Medicare HMO | Admitting: Internal Medicine

## 2016-10-10 ENCOUNTER — Encounter (HOSPITAL_COMMUNITY): Payer: Self-pay | Admitting: Internal Medicine

## 2016-10-10 MED FILL — Lidocaine HCl Local Preservative Free (PF) Inj 2%: INTRAMUSCULAR | Qty: 10 | Status: AC

## 2016-10-16 ENCOUNTER — Ambulatory Visit (INDEPENDENT_AMBULATORY_CARE_PROVIDER_SITE_OTHER): Payer: Medicare HMO | Admitting: Internal Medicine

## 2016-10-16 ENCOUNTER — Telehealth (HOSPITAL_COMMUNITY): Payer: Self-pay

## 2016-10-16 ENCOUNTER — Encounter: Payer: Self-pay | Admitting: Internal Medicine

## 2016-10-16 ENCOUNTER — Ambulatory Visit: Payer: Medicare HMO | Admitting: Internal Medicine

## 2016-10-16 VITALS — BP 136/74 | HR 76 | Ht 61.0 in | Wt 261.0 lb

## 2016-10-16 DIAGNOSIS — J300 Vasomotor rhinitis: Secondary | ICD-10-CM | POA: Diagnosis not present

## 2016-10-16 DIAGNOSIS — R0609 Other forms of dyspnea: Secondary | ICD-10-CM

## 2016-10-16 DIAGNOSIS — J4520 Mild intermittent asthma, uncomplicated: Secondary | ICD-10-CM | POA: Diagnosis not present

## 2016-10-16 DIAGNOSIS — R0600 Dyspnea, unspecified: Secondary | ICD-10-CM

## 2016-10-16 LAB — PULMONARY FUNCTION TEST
DL/VA % pred: 118 %
DL/VA: 5.23 ml/min/mmHg/L
DLCO cor % pred: 75 %
DLCO cor: 15.37 ml/min/mmHg
DLCO unc % pred: 78 %
DLCO unc: 15.9 ml/min/mmHg
FEF 25-75 Post: 1.77 L/sec
FEF 25-75 Pre: 2.33 L/sec
FEF2575-%Change-Post: -24 %
FEF2575-%Pred-Post: 110 %
FEF2575-%Pred-Pre: 144 %
FEV1-%Change-Post: -5 %
FEV1-%Pred-Post: 68 %
FEV1-%Pred-Pre: 72 %
FEV1-Post: 1.31 L
FEV1-Pre: 1.39 L
FEV1FVC-%Change-Post: 8 %
FEV1FVC-%Pred-Pre: 114 %
FEV6-%Change-Post: -12 %
FEV6-%Pred-Post: 58 %
FEV6-%Pred-Pre: 66 %
FEV6-Post: 1.41 L
FEV6-Pre: 1.61 L
FEV6FVC-%Pred-Post: 105 %
FEV6FVC-%Pred-Pre: 105 %
FVC-%Change-Post: -12 %
FVC-%Pred-Post: 55 %
FVC-%Pred-Pre: 63 %
FVC-Post: 1.41 L
FVC-Pre: 1.61 L
Post FEV1/FVC ratio: 93 %
Post FEV6/FVC ratio: 100 %
Pre FEV1/FVC ratio: 86 %
Pre FEV6/FVC Ratio: 100 %

## 2016-10-16 MED ORDER — PREDNISONE 50 MG PO TABS
ORAL_TABLET | ORAL | 0 refills | Status: DC
Start: 2016-10-16 — End: 2016-10-16

## 2016-10-16 MED ORDER — IPRATROPIUM BROMIDE 0.06 % NA SOLN: NASAL | 12 refills | Status: DC

## 2016-10-16 MED ORDER — PREDNISONE 50 MG PO TABS: ORAL_TABLET | ORAL | 0 refills | Status: DC

## 2016-10-16 NOTE — Addendum Note (Signed)
Addended by: Judithe Modest on: 10/16/2016 03:55 PM   Modules accepted: Orders

## 2016-10-16 NOTE — Progress Notes (Signed)
Subjective:   Patient ID: [name], female    DOB: August 14, 1945   MRN: [id number]    Brief patient profile:  48 yowf quit smoking in 1970s with h/o asthma as child but no trouble while smoking referred to pulmonary clinic 08/25/2012 for eval of sob bu [name] after admit:   Admit date: 07/22/2012  Discharge date: 07/24/2012  Primary Care Provider: Dr Arville Care  Primary Cardiologist: Dr Rollene Fare  Discharge Diagnoses  Unstable angina   HYPERTENSION- ;poor control  CAD, 03% LAD with systolic bridging- 47% 06/04/93  PTVDP 2001, upgraded to [facility] BiV ICD Sept 2008  Cardiomyopathy, nonischemic, Bi V responder, EF >55% by 2D Oct 2013  Allergic to IV contrast  Morbid obesity  COPD (chronic obstructive pulmonary disease), asthmatic component  GERD (gastroesophageal reflux disease)  Sleep apnea, C-pap intol  ASTHMA, UNSPECIFIED, UNSPECIFIED STATUS   08/25/2012 1st pulmonary eval cc onset of sob p ET for knee surgery, variable and improved with symbicort but variably better for up to a year under the care of Dr Orvil Feil = symbicort and zytrec and nasal spray and no spiriva and no nebs  And maybe  Better p  Albuterol" if walked too fast".  Assoc with overt HB, sense of "chest congestion" but no excess mucus production  >>stop spiriva and added pepcid and delsym   09/08/2012 Follow up and Med review  Pt returns for 2 week follow up and med review  Seen for IOV for 2nd opinion for Dyspnea /Asthma vs COPD last ov . She was recommended to stop spiriva and continue on symbicort Twice daily   Added Pepcid At bedtime  For possible GERD component.  She says she feels she is doing okay. No flare in cough or wheezing.  Does have OSA but intolerant to CPAP . Wakes up tired a lot and feel fatigued through day.  Rec No change rx, med calendar done      09/08/2013 f/u ov/Jaishon Krisher re: obesity/ gerd/ no med cal/ symbicort 160 2bid and xopenex  Chief Complaint  Patient presents with  . Acute Visit   Pt c/o increased SOB for the past month.  She is SOB with or w/o any exertion.  "Feels like something cutting my air off".  She also c/o wheezing and cough- prod with thick, white sputum.  She is using albuterol inhaler 3-4 times daily. Has neb at home but has not used yet.   onset was gradual, pattern is persistent daily > noct x worse  over a month, really extends back a year  feels choking sensation day > noct no purulent sputum, poor hfa (see a/p) To ER 08/18/13 with neg v/q  Much better p pred Taking ppi at hs Has saba neb but not using  rec Change omeprazole to Take 30- 60 min before your first and last meals of the day  Work on inhaler technique:    If not better > Prednisone 10 mg take  4 each am x 2 days,   2 each am x 2 days,  1 each am x 2 days and stop  GERD diet   10/10/2013 f/u ov/Mauricio Dahlen re: MO/ ? Asthma on symbicort 160 2bid and prn proair Chief Complaint  Patient presents with  . Follow-up    PFT done today. Breathing may be slightly improved. Using albuterol on average oncce per day.   [employer] walking leans on cart due to sob Also fatigued / not able to tolerate cpap by Dr Erik Obey rec  Try bisoprolol 5 mg one daily and stop the metaprolol - call me between now and your next visit with Dr C if your blood pressure isn't doing great. Try symbicort 80 Take 2 puffs first thing in am and then another 2 puffs about 12 hours later to see if notice any change in resp symptoms and if not this will be your new maintenance dose    08/29/2016  Acute ov/Cyrah Mclamb re-establish re: sob ? Etiology  Chief Complaint  Patient presents with  . Acute Visit    Increased SOB x 2 months. She states that she gets out of breath just walking around her apartment. She also feels SOB at rest occ "feels like something cute my air off".  She has occ wheezing and occ chest tightness. She has a cough mainly only in the am- prod with thick, white sputum. She takes mucinex daily. She uses her albuterol inhaler 1 x  daily on average.   better p heart surgery but then several months prior to [place] more doe  Abrupt sob at rest then resolves after a few minutes/ sometime a eats - no def consistent response  to [place] ok L side down / 2 pillow orthopnea  Cards w/u neg  prilosec 30 min ac in am and more often takes the second dose  after supper  rec Prilosec should be Take 30- 60 min before your first and last meals of the day  GERD Plan A = Automatic = symbicort 80 2bid > symb 160 2bid  Work on inhaler technique:    Plan B = Backup Only use your albuterol as a rescue medication     10/16/2016  f/u ov/Arnola Crittendon re: sob with pfts nl ex low erv c/w obesity  Chief Complaint  Patient presents with  . Follow-up    PFT's done today. She was started on nocturnal o2 per Dr. Haroldine Laws. She is coughing with thick, white sputum. Her breathing has improved slightly since her last visit here. She uses her albuterol inhaler 2 x per wk on average and rarely uses neb.    drippy nose eating x sev years no better with nasal steroids assoc with day > noct coughing no better on symb 80 vs 160 - says breathing worse on the 80 so resumed 160 / saba use as above very rarely noct, mostly when "overdoes it"   No obvious day to day or daytime variability or assoc  purulent sputum or mucus plugs or hemoptysis or cp or chest tightness, subjective wheeze or overt sinus or hb symptoms. No unusual exp hx or h/o childhood pna/ asthma or knowledge of premature birth.  Sleeping ok on 02 per cards  without nocturnal  or early am exacerbation  of respiratory  c/o's or need for noct saba. Also denies any obvious fluctuation of symptoms with weather or environmental changes or other aggravating or alleviating factors except as outlined above   Current Medications, Allergies, Complete Past Medical History, Past Surgical History, Family History, and Social History were reviewed in [employer] record.  ROS  The following  are not active complaints unless bolded sore throat, dysphagia, dental problems, itching, sneezing,  nasal congestion or excess/ purulent secretions, ear ache,   fever, chills, sweats, unintended wt loss, classically pleuritic or exertional cp,  orthopnea pnd or leg swelling, presyncope, palpitations, abdominal pain, anorexia, nausea, vomiting, diarrhea  or change in bowel or bladder habits, change in stools or urine, dysuria,hematuria,  rash, arthralgias,  visual complaints, headache, numbness, weakness or ataxia or problems with walking or coordination,  change in mood/affect or memory.                           Objective:   Physical Exam   Elderly obese wf  nad    10/16/2016         261  08/29/2016       256  10/10/2013       243     09/08/13 248 lb (112.492 kg)  09/07/13 243 lb 4.8 oz (110.36 kg)  09/01/13 249 lb 12.8 oz (113.309 kg)      vital signs reviewed  - Note on arrival 02 sats  95% on RA     HEENT: nl dentition, turbinates bilaterally, and oropharynx. Nl external ear canals without cough reflex   NECK :  without JVD/Nodes/TM/ nl carotid upstrokes bilaterally   LUNGS: no acc muscle use,  Nl contour chest which is clear to A and P bilaterally without cough on insp or exp maneuvers   CV:  RRR  no s3 or murmur or increase in P2, and 1+ pitting sym bilateral lower ext edema   ABD:  soft and nontender with nl inspiratory excursion in the supine position. No bruits or organomegaly appreciated, bowel sounds nl  MS:  Nl gait/ ext warm without deformities, calf tenderness, cyanosis or clubbing No obvious joint restrictions   SKIN: warm and dry without lesions    NEURO:  alert, approp, nl sensorium with  no motor or cerebellar deficits apparent.        I personally reviewed images and agree with radiology impression as follows:  CXR:   09/19/16 Minimal bibasilar atelectasis noted.  Lungs otherwise clear.      Assessment & Plan:   Outpatient Encounter  Prescriptions as of 10/16/2016  Medication Sig  . acetaminophen (TYLENOL) 500 MG tablet Take 1,000 mg by mouth every 6 (six) hours as needed (for pain.).  [name] albuterol (PROVENTIL) (2.5 MG/3ML) 0.083% nebulizer solution Take 2.5 mg by nebulization every 6 (six) hours as needed for wheezing or shortness of breath.  [name] aspirin EC 81 MG tablet Take 1 tablet (81 mg total) by mouth daily.  . bisoprolol (ZEBETA) 5 MG tablet Take 1 tablet (5 mg total) by mouth daily.  . budesonide-formoterol (SYMBICORT) 160-4.5 MCG/ACT inhaler Inhale 2 puffs into the lungs 2 (two) times daily.  . cetirizine (ZYRTEC) 10 MG tablet Take 10 mg by mouth daily.   . clopidogrel (PLAVIX) 75 MG tablet Take 1 tablet (75 mg total) by mouth daily. (Patient taking differently: Take 75 mg by mouth every evening. )  . colchicine 0.6 MG tablet Take one tablet two times daily as needed. (Patient taking differently: Take 0.6-1.2 mg by mouth daily as needed (for gout flares ups). Take one tablet two times daily as needed.)  . docusate sodium (COLACE) 100 MG capsule Take 100 mg by mouth every evening.  [name] EPINEPHrine 0.3 mg/0.3 mL IJ SOAJ injection Inject 0.3 mg into the muscle daily as needed (anaphylatic allergic reaction).   . fluticasone (FLONASE) 50 MCG/ACT nasal spray Place 1 spray into both nostrils every evening.   . hydroxypropyl methylcellulose / hypromellose (ISOPTO TEARS / GONIOVISC) 2.5 % ophthalmic solution Place 1 drop into both eyes 4 (four) times daily as needed for dry eyes.   . isosorbide mononitrate (IMDUR) 30 MG 24 hr tablet TAKE 1 TABLET EVERY DAY  .  metolazone (ZAROXOLYN) 5 MG tablet Take 1 tablet (5 mg total) by mouth as needed. Take only as directed by Heart Failure Clinic (Patient taking differently: Take 5 mg by mouth daily as needed (for fluid retention). )  . Multiple Vitamin (MULTIVITAMIN WITH MINERALS) TABS tablet Take 1 tablet by mouth daily.  [name] omeprazole (PRILOSEC) 40 MG capsule Take 1 capsule (40 mg total) by  mouth 2 (two) times daily before a meal.  . OXYGEN Inhale 2 L into the lungs See admin instructions. During waking hours as needed for low oxygen levels & at bedtime each night.  . potassium chloride SA (K-DUR,KLOR-CON) 20 MEQ tablet Take 20 mEq by mouth daily.   . rosuvastatin (CRESTOR) 20 MG tablet Take 1 tablet (20 mg total) by mouth daily. (Patient taking differently: Take 20 mg by mouth every evening. )  . spironolactone (ALDACTONE) 25 MG tablet Take 1 tablet (25 mg total) by mouth daily. (Patient taking differently: Take 25 mg by mouth every evening. )  . torsemide (DEMADEX) 20 MG tablet Take 2 tablets (40 mg total) by mouth daily.  [name] triamcinolone cream (KENALOG) 0.1 % Apply 1 application topically daily as needed (applied to affected itchy area(s)).   . VENTOLIN HFA 108 (90 Base) MCG/ACT inhaler INHALE 2 PUFFS EVERY 4 HOURS AS NEEDED FOR WHEEZING  . ipratropium (ATROVENT) 0.06 % nasal spray 1-2 pffs up to 4 x daily as needed  . [DISCONTINUED] budesonide-formoterol (SYMBICORT) 80-4.5 MCG/ACT inhaler Inhale 2 puffs into the lungs 2 (two) times daily. (Patient not taking: Reported on 10/07/2016)   No facility-administered encounter medications on file as of 10/16/2016.

## 2016-10-16 NOTE — Assessment & Plan Note (Signed)
.  10/10/2013  Walked RA @ nl pace x  2 laps @ 185 ft each stopped due to  Sob, no desat -  08/29/2016   Walked RA  2 laps @ 185 ft each stopped due to  Moderately fast pace and sob/ sat 91%  - Spirometry 08/29/2016  FEV1 1.23 (51%)  Ratio 86  with no  curvature  On symbicort 160 x 2 but poor hfa - FENO 08/29/2016  =   28 on symb 160 with poor hfa - 08/29/2016  After extensive coaching HFA effectiveness =    75% from a baseline of 25% so try symb 80 2bid  - PFT's  10/16/2016  FEV1 1.31 (68 % ) ratio 93  p no  % improvement from saba p nothing prior to study with DLCO  78/75 % corrects to 118  % for alv volume  And ERV  22%  - 10/16/2016  After extensive coaching HFA effectiveness =    25%   I strongly doubt there is much asthma present here given how poorly she's using her hfa and the absence of any curvature at all on f/v loop.  The low erv is typical of the effects of obesity/ advised  I had an extended discussion with the patient reviewing all relevant studies completed to date and  lasting 15 to 20 minutes of a 25 minute visit    Each maintenance medication was reviewed in detail including most importantly the difference between maintenance and prns and under what circumstances the prns are to be triggered using an action plan format that is not reflected in the computer generated alphabetically organized AVS.    Please see AVS for specific instructions unique to this visit that I personally wrote and verbalized to the the pt in detail and then reviewed with pt  by my nurse highlighting any  changes in therapy recommended at today's visit to their plan of care.

## 2016-10-16 NOTE — Assessment & Plan Note (Signed)
Body mass index is 49.32 kg/m.  -  trending up still  Lab Results  Component Value Date   TSH 3.677 02/21/2015     Contributing to gerd risk/ doe/reviewed the need and the process to achieve and maintain neg calorie balance > defer f/u primary care including intermittently monitoring thyroid status

## 2016-10-16 NOTE — Progress Notes (Signed)
PFT done today. 

## 2016-10-16 NOTE — Assessment & Plan Note (Addendum)
-   10/10/2013 PFT's no sign airflow obst p am symbicort 160 > try symbicort 80 2bid  - 10/16/2016 pfts s obst s am symbicort  - 10/16/2016  After extensive coaching HFA effectiveness =    25% > ok to continue symbicort 160 since worse on 80 2bid at pt request though doubt much asthma here though at risk of "cardiac asthma" with f/u cards planned  pulmonary f/u prn

## 2016-10-16 NOTE — Telephone Encounter (Signed)
Pt is allergic to dye for tomorrows cath (9/7) per cath lab. Spoke with pt son [name] b/c pt is at an appt and has no cell phone. Son is aware and agreeable.

## 2016-10-16 NOTE — Assessment & Plan Note (Signed)
Trial of atrovent 0.06% NS   May be contributing to cough > allergy eval prn neg resp to atrovent since already tried and failed flonase

## 2016-10-16 NOTE — Patient Instructions (Signed)
For drippy nose > atrovent nasal spray up to 2 pffs 4 x daily  - point toward nose on same side  Work on inhaler technique:  relax and gently blow all the way out then take a nice smooth deep breath back in, triggering the inhaler at same time you start breathing in.  Hold for up to 5 seconds if you can. Blow out thru nose. Rinse and gargle with water when done      If you are satisfied with your treatment plan,  let your doctor know and he/she can either refill your medications or you can return here when your prescription runs out.     If in any way you are not 100% satisfied,  please tell us.  If 100% better, tell your friends!  Pulmonary follow up is as needed

## 2016-10-17 ENCOUNTER — Ambulatory Visit (HOSPITAL_COMMUNITY): Admission: RE | Admit: 2016-10-17 | Payer: Medicare HMO | Source: Ambulatory Visit | Admitting: Cardiology

## 2016-10-17 IMAGING — CT CT CERVICAL SPINE W/O CM
4 of 8 series · 12 of 33 positions shown, 13 images · non-contrast
Comparison: None.

CLINICAL DATA: Left arm and leg paresthesias, onset at [DATE].
Partial resolution since that time.

EXAM:
CT HEAD WITHOUT CONTRAST
CT CERVICAL SPINE WITHOUT CONTRAST
TECHNIQUE: Multidetector CT imaging of the head and cervical spine was
performed following the standard protocol without intravenous
contrast. Multiplanar CT image reconstructions of the cervical spine
were also generated.

[Series 6: coronal · coronal · 0.28mm/px · 1 of 60 slices shown]
[im 30/60  bone]
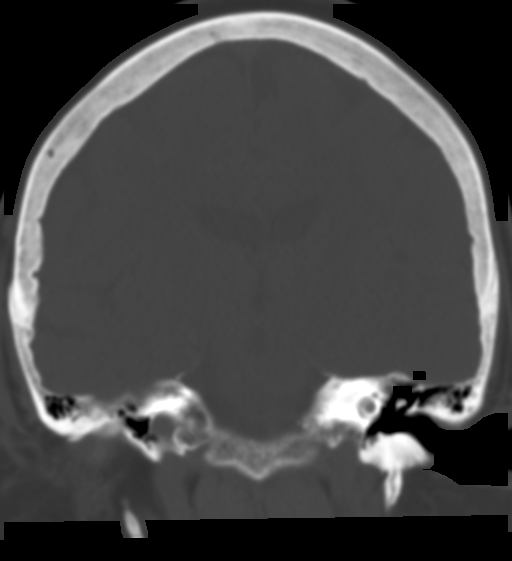

[Series 7: sagittal · sagittal · 0.29mm/px · 5 of 48 slices shown]
[im 8/48  bone]
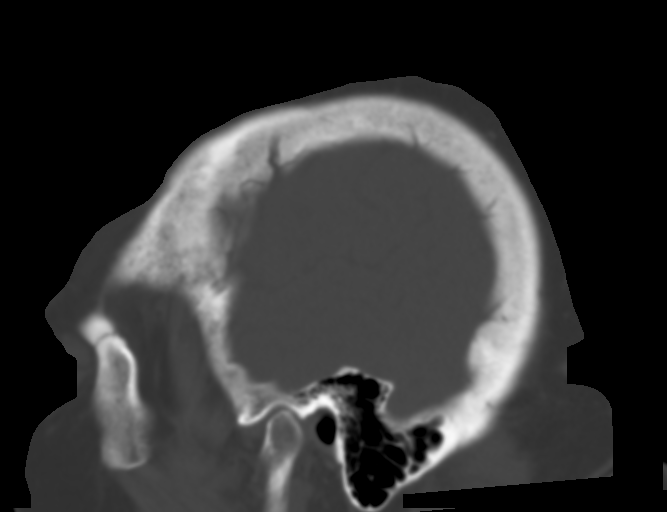
[im 16/48  bone]
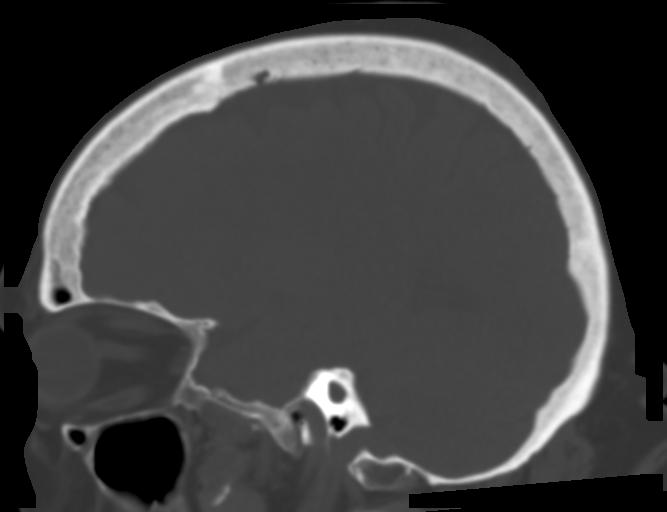
[im 24/48  bone]
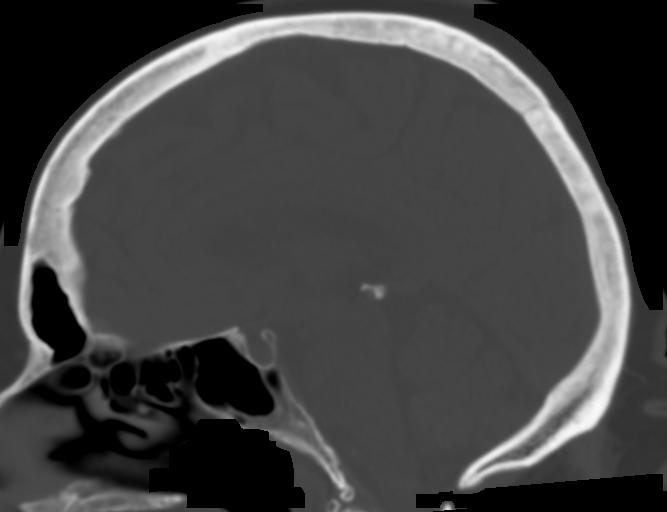
[im 32/48  bone]
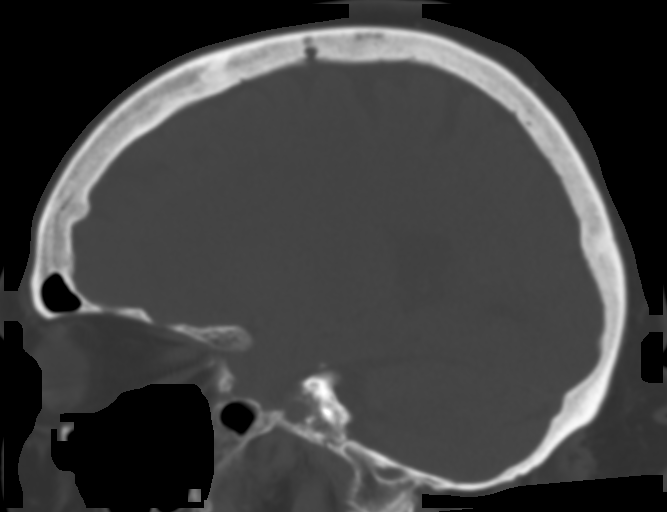
[im 40/48  bone]
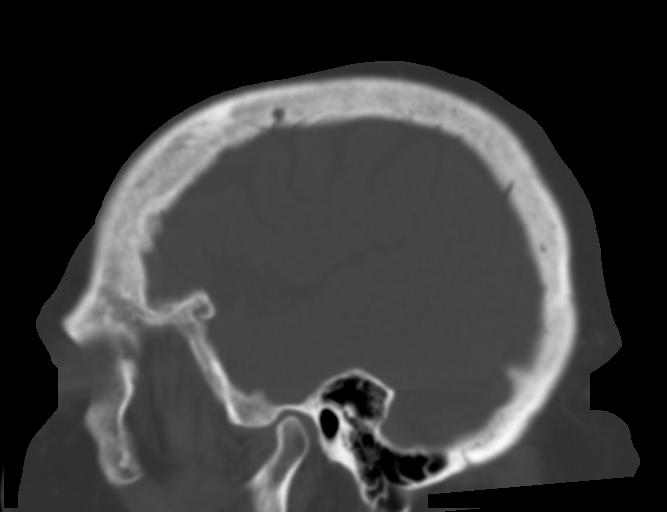

[Series 8: c-spine st · axial · 0.28mm/px · z∈[+78,+146]mm · 3 of 70 slices shown]
[im 18/70  bone]
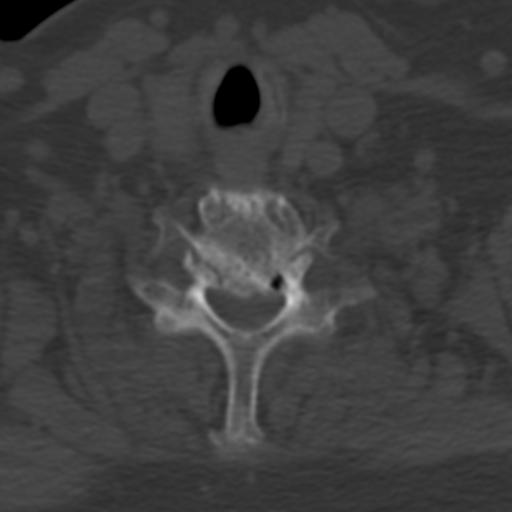
[im 35/70  bone]
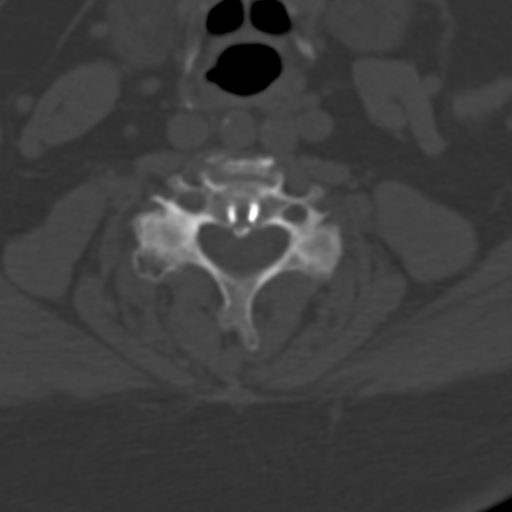
[im 52/70  bone]
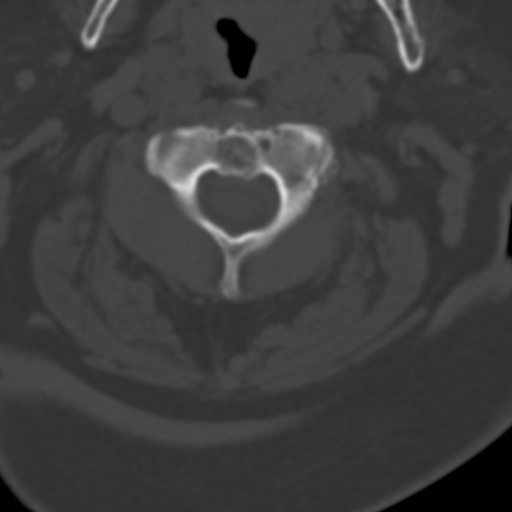

[Series 11: axial recon · axial · 0.19mm/px · z∈[+55,+130]mm · 3 of 81 slices shown, 4 images]
[im 21/81  soft-tissue]
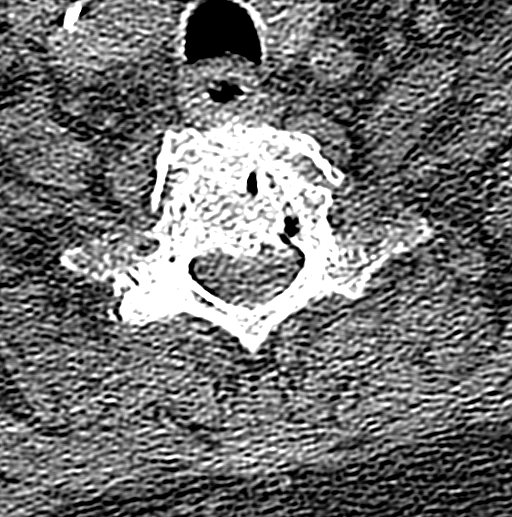
[im 21/81  bone]
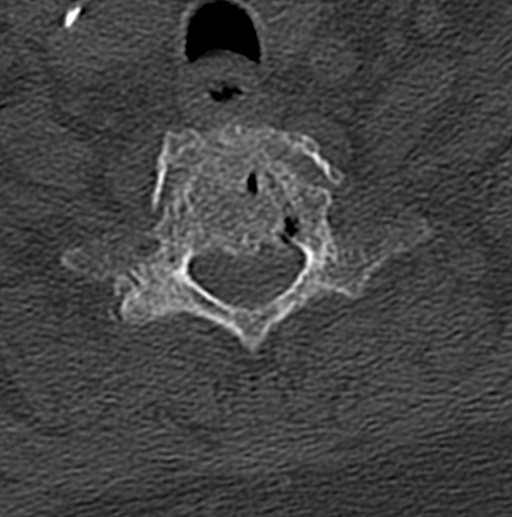
[im 41/81  bone]
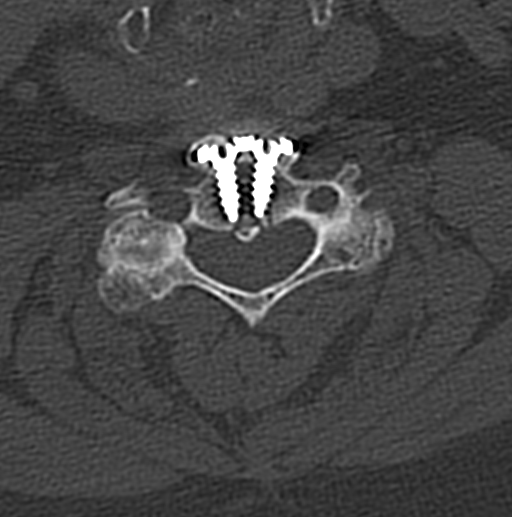
[im 61/81  bone]
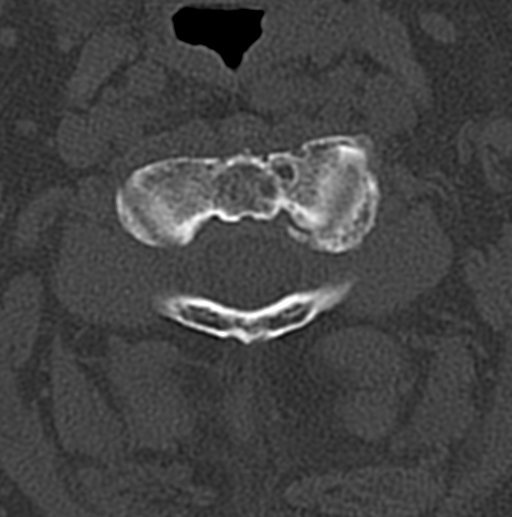

[12 of 33 positions shown; findings below may reference images not displayed]

FINDINGS: CT HEAD FINDINGS

Brain:

There is no intracranial hemorrhage, mass or evidence of acute
infarction. Remote lacunar infarction in the left caudate. There is
mild generalized atrophy. There is mild chronic microvascular
ischemic change. There is no significant extra-axial fluid
collection.

No acute intracranial findings are evident.

Vascular: No hyperdense vessel or unexpected calcification.

Skull: Normal. Negative for fracture or focal lesion.

Sinuses/Orbits: No acute finding.

CT CERVICAL SPINE FINDINGS

Alignment: Mild straightening of cervical lordosis. Prior anterior
interbody fusion with plate screw fixation hardware at C4-5.

Skull base and vertebrae: No acute fracture. No primary bone lesion
or focal pathologic process.

Soft tissues and spinal canal: No prevertebral fluid or swelling. No
visible canal hematoma.

Disc levels: Moderate degenerative disc changes from C4 through C7.
Prominent posterior osteophytes at C5-6 and C6-7 likely cause mild
degenerative central canal stenosis.

Upper chest: No significant abnormality

Other:
IMPRESSION: 1. No acute intracranial findings. There is mild generalized atrophy
and chronic appearing white matter hypodensities which likely
represent small vessel ischemic disease. Remote left caudate lacunar
infarction.
2. No acute cervical spine findings. Prior C4-5 anterior interbody
fusion. Moderate degenerative cervical disc disease from C4 through
C7.

## 2016-10-17 SURGERY — CORONARY STENT INTERVENTION
Anesthesia: LOCAL

## 2016-10-18 IMAGING — CR DG CHEST 2V
2 series · 2 of 2 positions shown · non-contrast
Comparison: Chest radiograph 06/20/2015

CLINICAL DATA: Midchest pain for 1 week.  Shortness of breath.

EXAM:
CHEST  2 VIEW

[w chest pa]
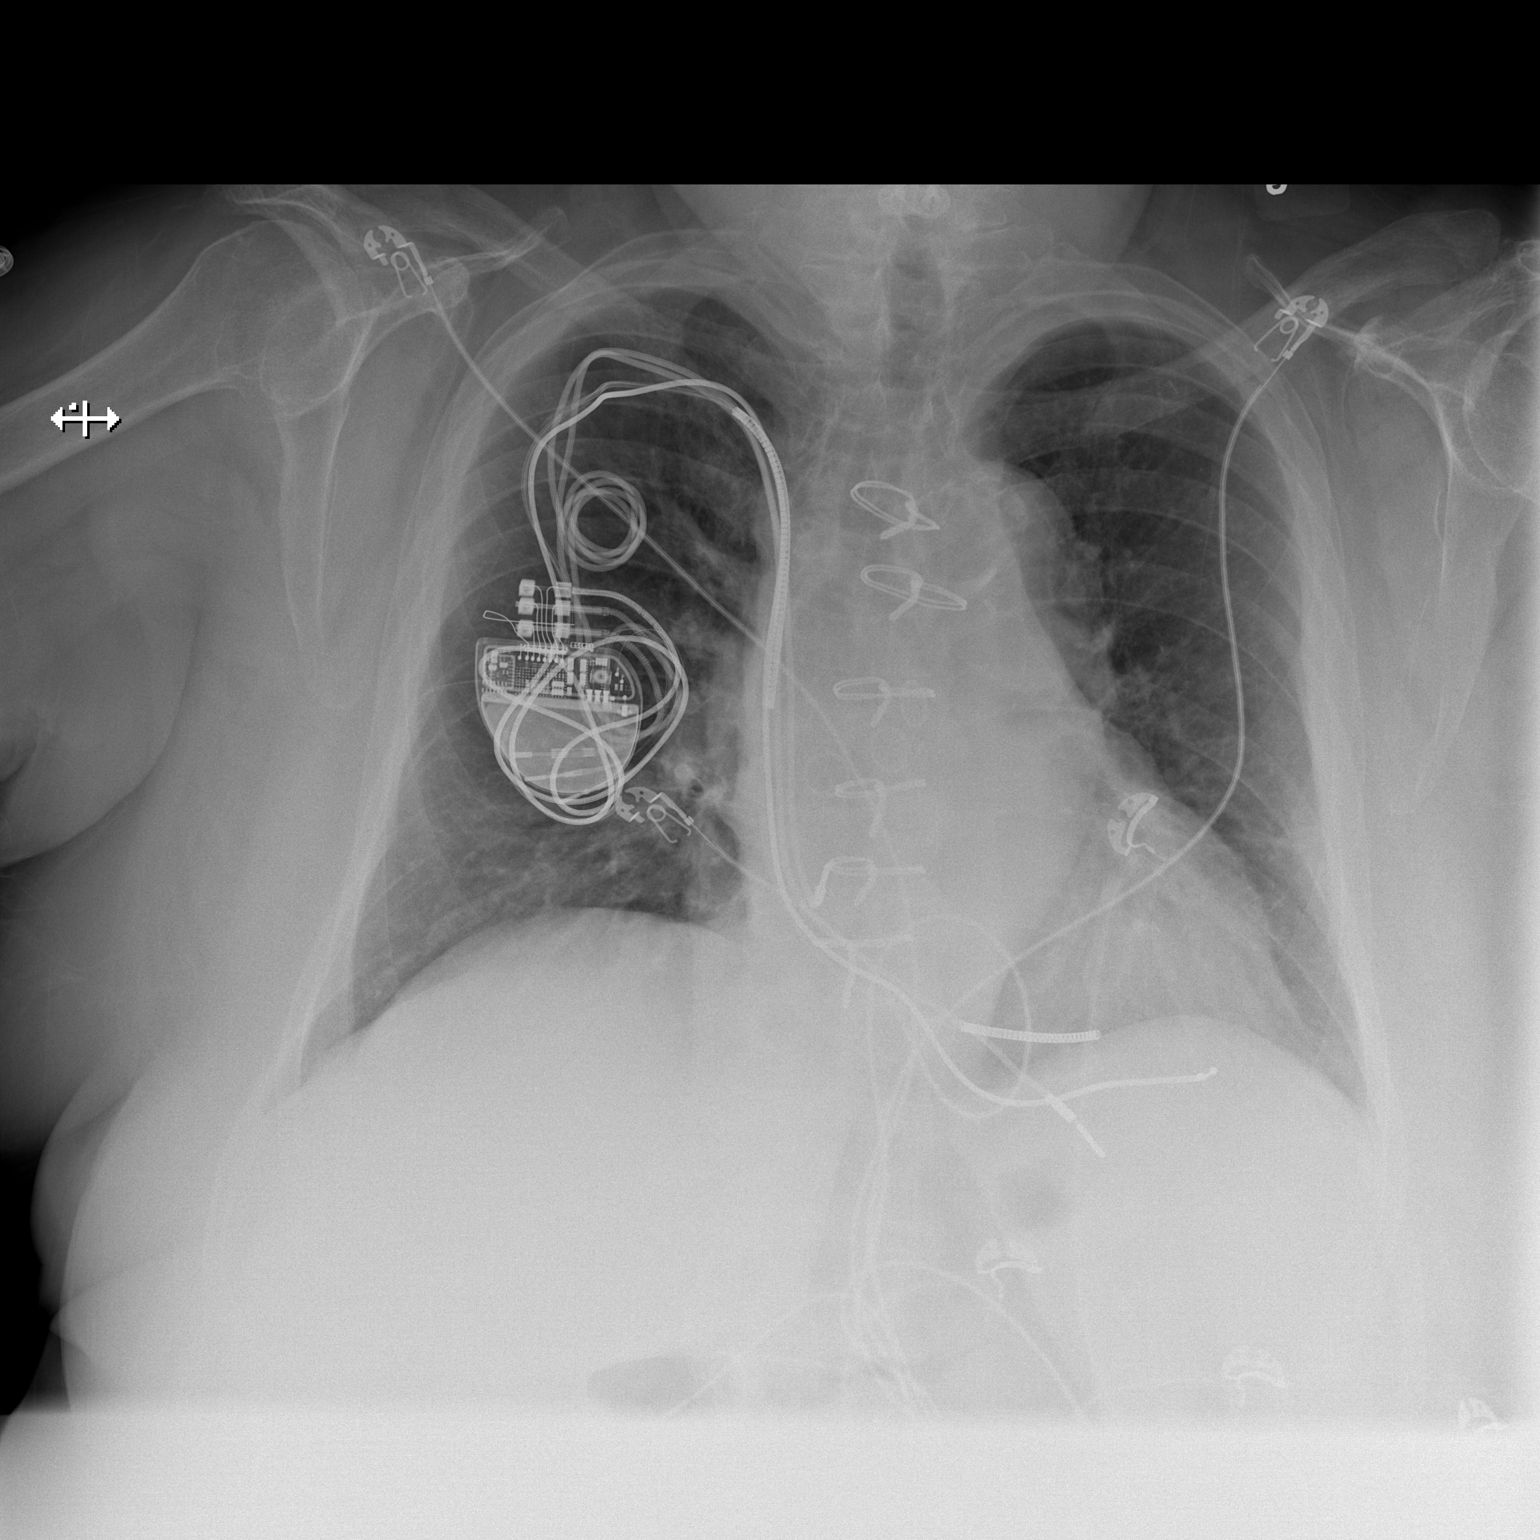

[w chest lat]
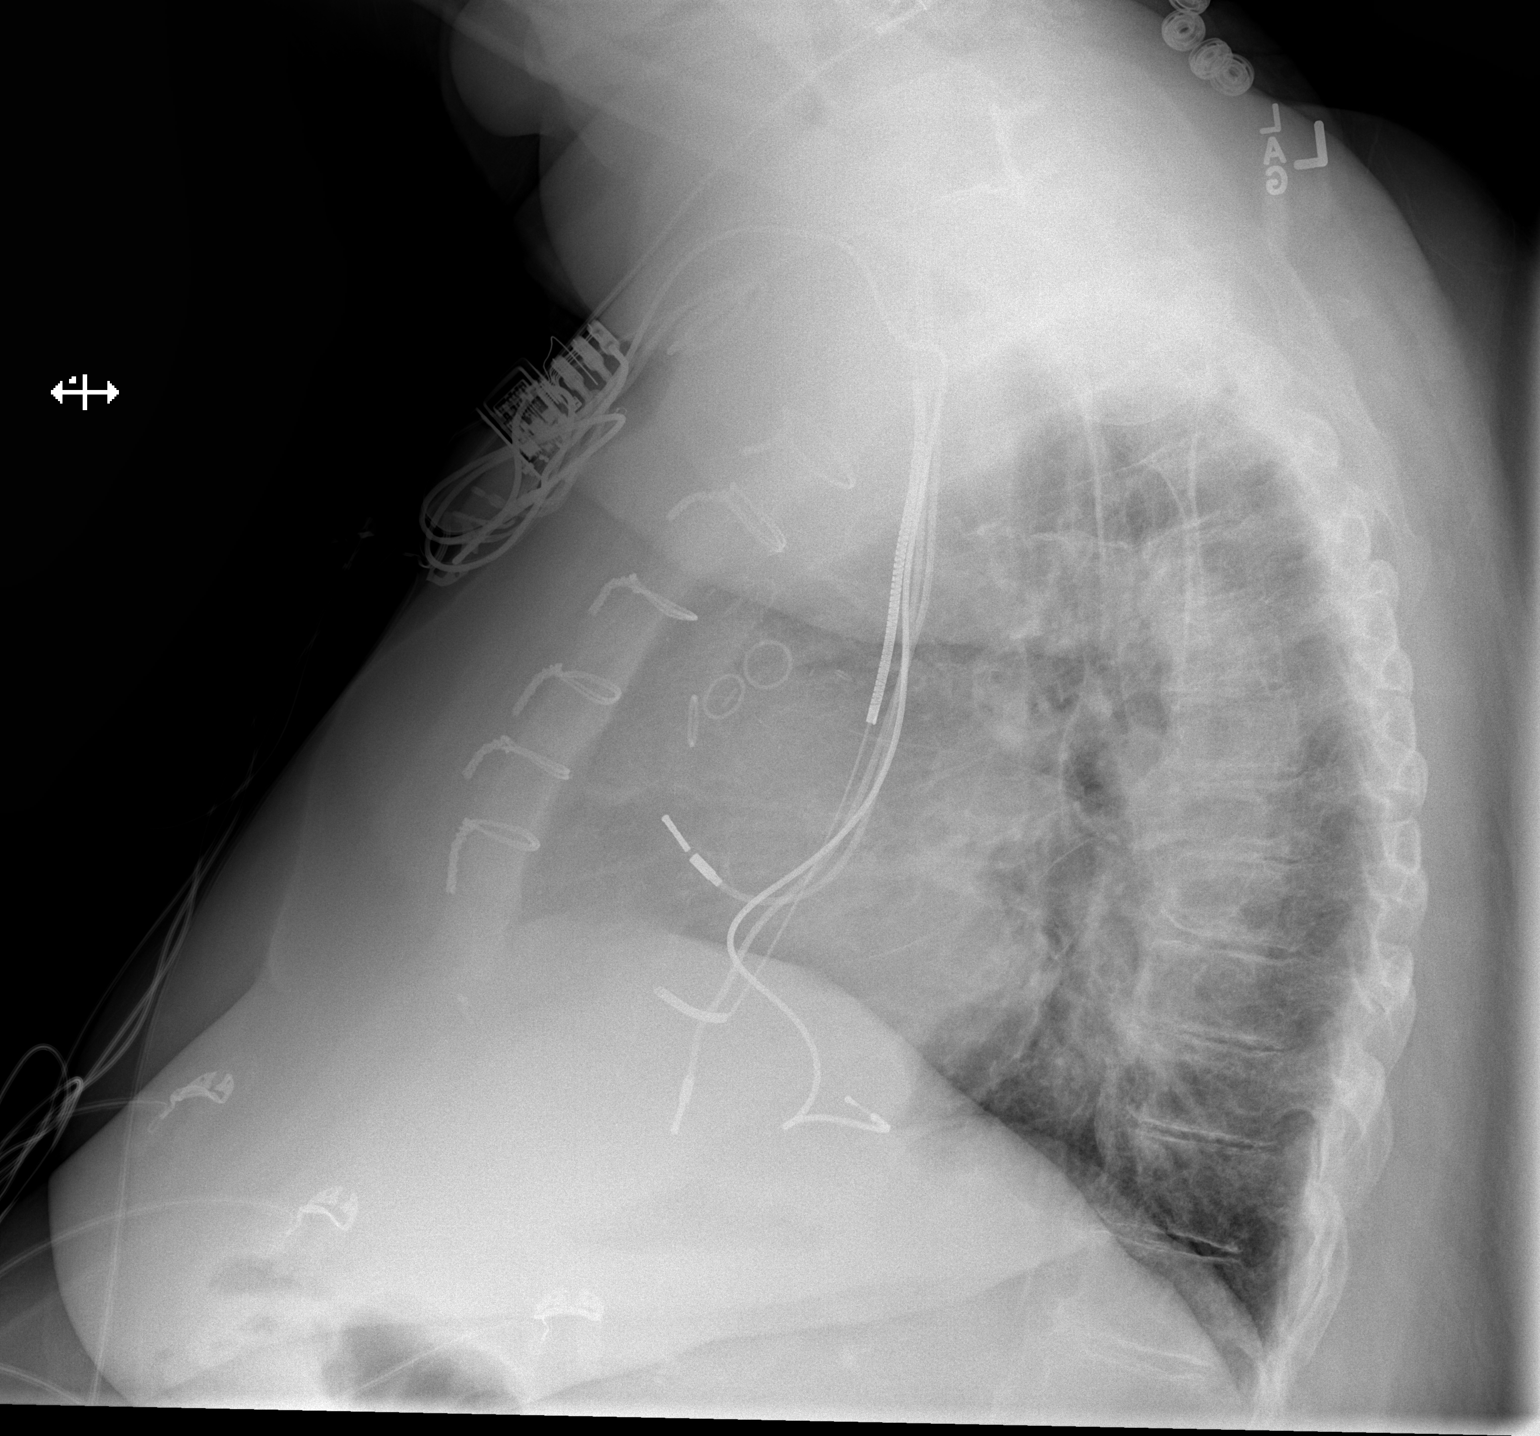

[2 of 2 positions shown; findings below may reference images not displayed]

FINDINGS: Right chest wall AICD leads are unchanged in position.
Cardiomediastinal contours are unchanged. No focal airspace
consolidation or pulmonary edema. No pneumothorax or sizable pleural
effusion. CABG markers and median sternotomy wires are unchanged.
IMPRESSION: No active cardiopulmonary disease.  Aortic atherosclerosis.

## 2016-10-19 IMAGING — CT CT HEAD W/O CM
4 series · 16 of 47 positions shown, 18 images · non-contrast
Comparison: Head and cervical spine CT without contrast 10/22/2015.

CLINICAL DATA: 70-year-old female left arm and leg paresthesia
since 10/22/2015. Initial encounter.

EXAM:
CT HEAD WITHOUT CONTRAST
TECHNIQUE: Contiguous axial images were obtained from the base of the skull
through the vertex without intravenous contrast.

[Series 2: head without · axial · non-contrast · 0.46mm/px · z∈[-151,-31]mm · 7 of 32 slices shown, 9 images]
[im 4/32  brain]
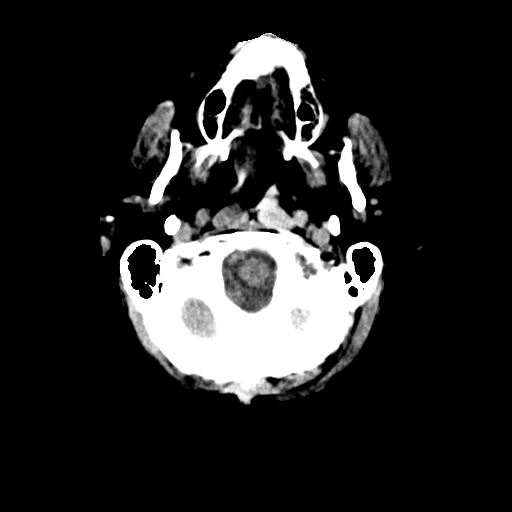
[im 4/32  bone]
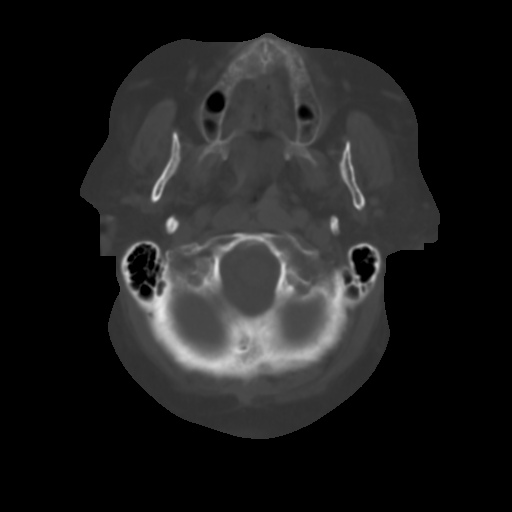
[im 8/32  brain]
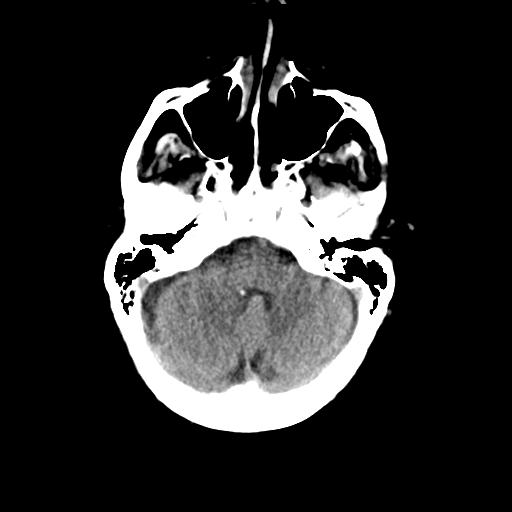
[im 12/32  brain]
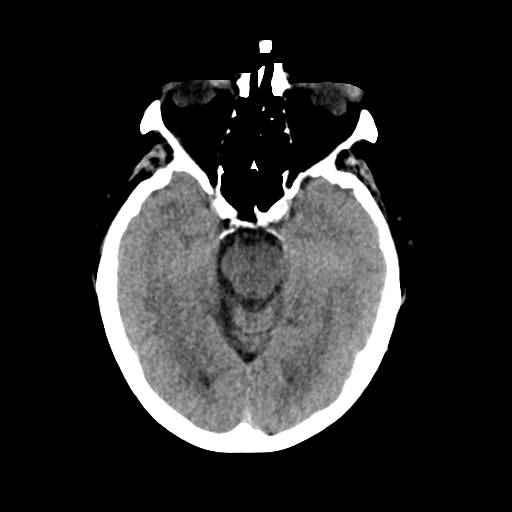
[im 16/32  brain]
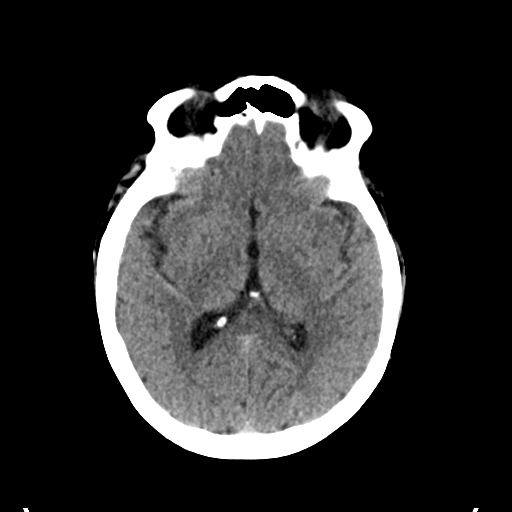
[im 20/32  brain]
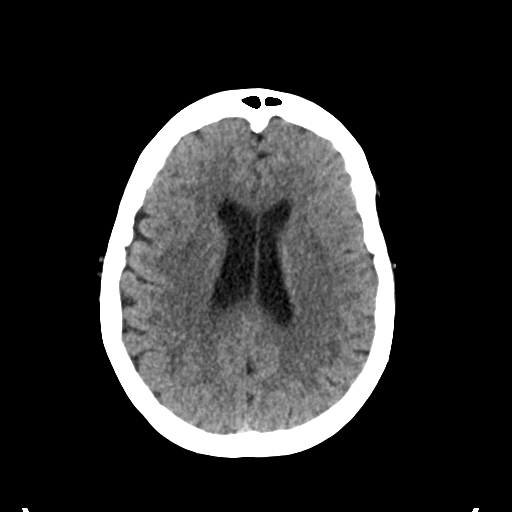
[im 20/32  bone]
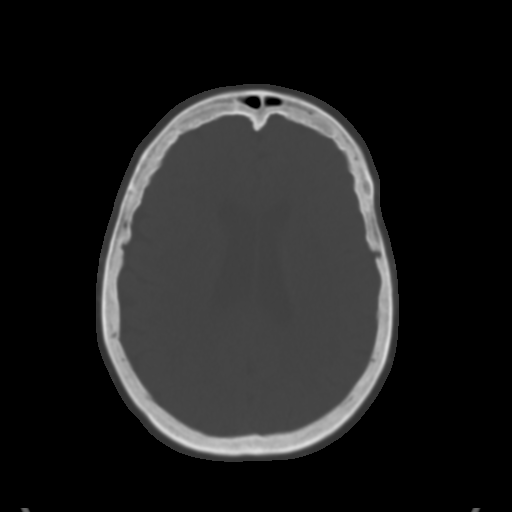
[im 24/32  brain]
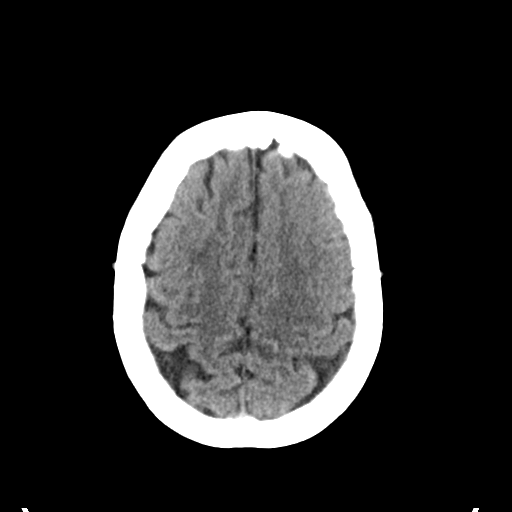
[im 28/32  brain]
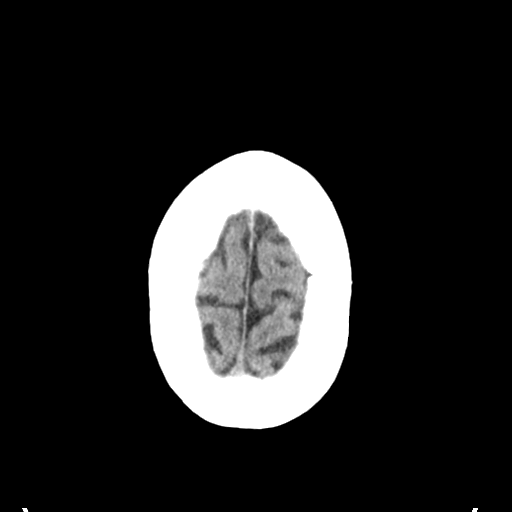

[Series 3: head bone · axial · 0.46mm/px · z∈[-152,-120]mm · 3 of 80 slices shown]
[im 8/80  bone]
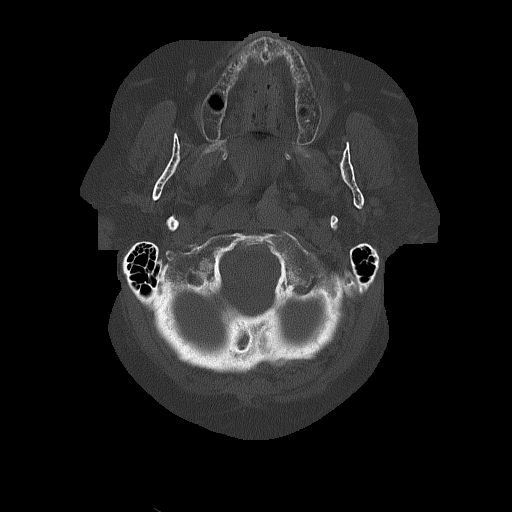
[im 16/80  bone]
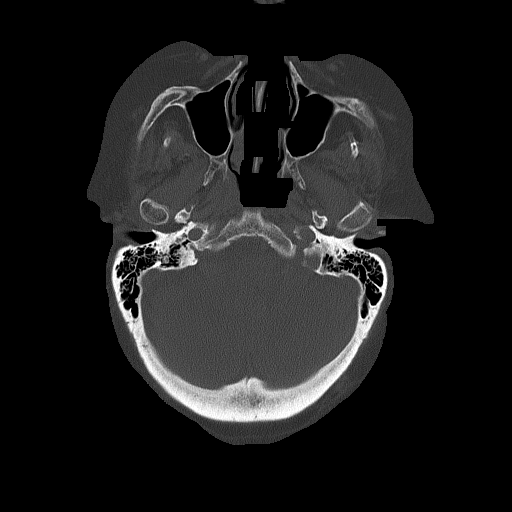
[im 24/80  bone]
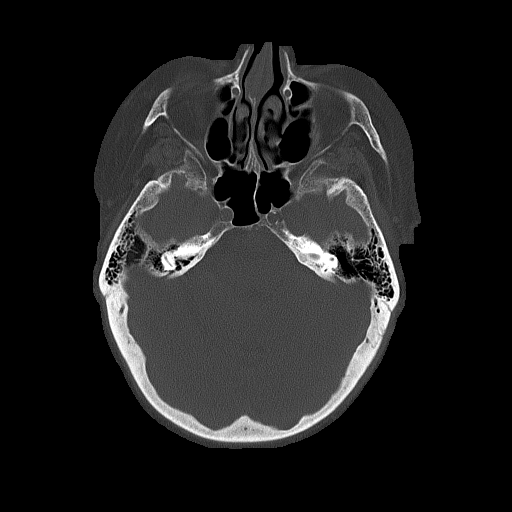

[Series 4: head without cor · coronal · non-contrast · 0.30mm/px · 3 of 63 slices shown]
[im 21/63  brain]
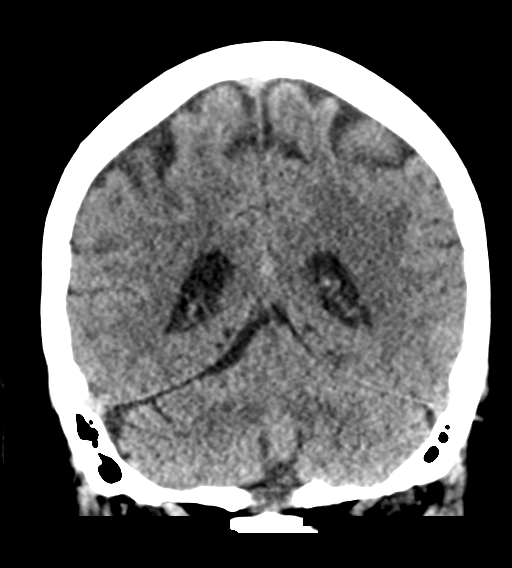
[im 28/63  brain]
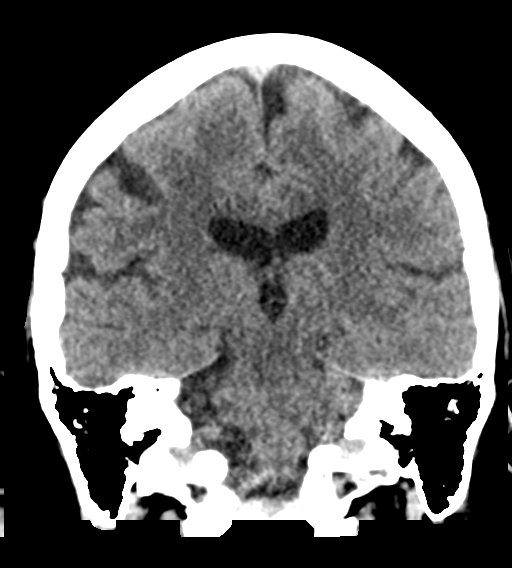
[im 35/63  brain]
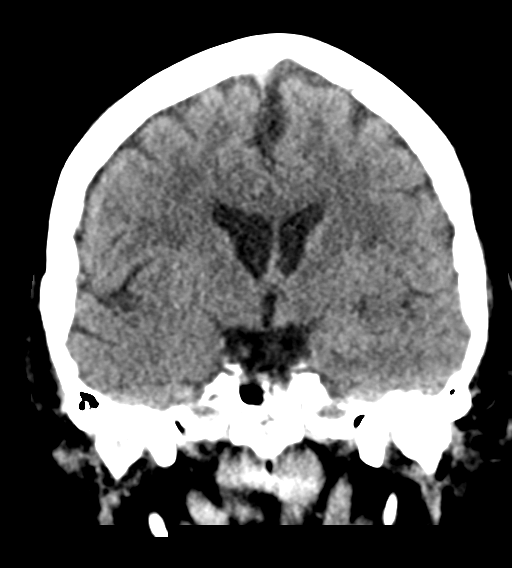

[Series 5: head without sag · sagittal · non-contrast · 0.33mm/px · 3 of 50 slices shown]
[im 17/50  brain]
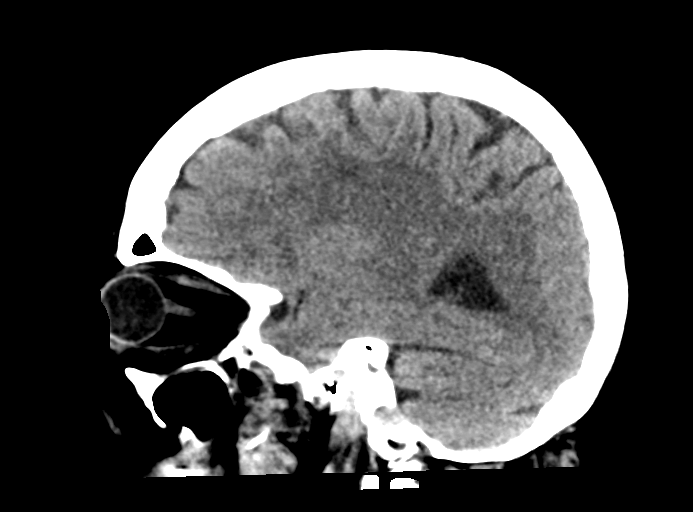
[im 25/50  brain]
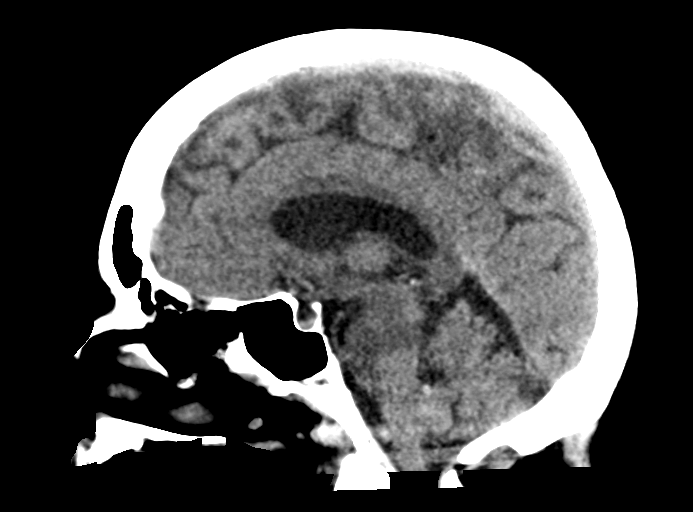
[im 33/50  brain]
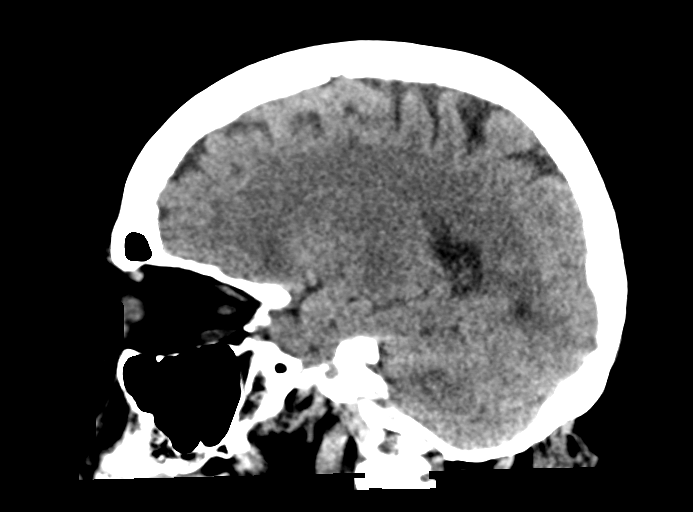

[16 of 47 positions shown; findings below may reference images not displayed]

FINDINGS: Brain: Cerebral volume is within normal limits for age. Occasional
patchy white matter hypodensity appears stable (including in the
right frontal lobe white matter series 2, image 22). Similar
findings in the anterior external capsules are stable. No cortically
based acute infarct identified. No acute intracranial hemorrhage
identified. No midline shift, mass effect, or evidence of
intracranial mass lesion. No ventriculomegaly.

Vascular: Calcified atherosclerosis at the skull base. No suspicious
intracranial vascular hyperdensity.

Skull: Stable visualized osseous structures.

Sinuses/Orbits: Visualized paranasal sinuses and mastoids are stable
and well pneumatized.

Other: No acute orbit or scalp soft tissue findings.
IMPRESSION: Stable non contrast CT appearance of the brain with no acute
cortically based infarct identified.

Evidence of some small vessel disease in the cerebral white matter.

## 2016-10-22 ENCOUNTER — Telehealth: Payer: Self-pay | Admitting: Cardiovascular Disease

## 2016-10-22 NOTE — Telephone Encounter (Signed)
New message      When are you rescheduling her procedure for, no one called and told her she was to be there Friday 9/7

## 2016-10-22 NOTE — Telephone Encounter (Signed)
Looks like CHF ordered and pt will need to call there to discuss she states that "they told her to call here" she will call back and discuss this with them. Pt is very upset and states that we did not call her son. But she will call them, she will call back if she needs anything else from [place]

## 2016-10-26 DIAGNOSIS — R269 Unspecified abnormalities of gait and mobility: Secondary | ICD-10-CM | POA: Diagnosis not present

## 2016-10-26 DIAGNOSIS — J9611 Chronic respiratory failure with hypoxia: Secondary | ICD-10-CM | POA: Diagnosis not present

## 2016-10-26 DIAGNOSIS — J4520 Mild intermittent asthma, uncomplicated: Secondary | ICD-10-CM | POA: Diagnosis not present

## 2016-10-26 DIAGNOSIS — I10 Essential (primary) hypertension: Secondary | ICD-10-CM | POA: Diagnosis not present

## 2016-10-26 DIAGNOSIS — I5032 Chronic diastolic (congestive) heart failure: Secondary | ICD-10-CM | POA: Diagnosis not present

## 2016-10-26 DIAGNOSIS — R0602 Shortness of breath: Secondary | ICD-10-CM | POA: Diagnosis not present

## 2016-10-30 ENCOUNTER — Emergency Department (HOSPITAL_COMMUNITY): Payer: Medicare HMO

## 2016-10-30 ENCOUNTER — Other Ambulatory Visit: Payer: Self-pay

## 2016-10-30 ENCOUNTER — Encounter (HOSPITAL_COMMUNITY): Payer: Self-pay | Admitting: Emergency Medicine

## 2016-10-30 ENCOUNTER — Inpatient Hospital Stay (HOSPITAL_COMMUNITY)
Admission: EM | Admit: 2016-10-30 | Discharge: 2016-11-01 | DRG: 246 | Disposition: A | Discharge status: Home / Self Care | Payer: Medicare HMO | Attending: Internal Medicine | Admitting: Internal Medicine

## 2016-10-30 DIAGNOSIS — Z79899 Other long term (current) drug therapy: Secondary | ICD-10-CM | POA: Diagnosis not present

## 2016-10-30 DIAGNOSIS — E669 Obesity, unspecified: Secondary | ICD-10-CM | POA: Diagnosis present

## 2016-10-30 DIAGNOSIS — I10 Essential (primary) hypertension: Secondary | ICD-10-CM | POA: Diagnosis present

## 2016-10-30 DIAGNOSIS — Z951 Presence of aortocoronary bypass graft: Secondary | ICD-10-CM

## 2016-10-30 DIAGNOSIS — E7800 Pure hypercholesterolemia, unspecified: Secondary | ICD-10-CM | POA: Diagnosis not present

## 2016-10-30 DIAGNOSIS — I130 Hypertensive heart and chronic kidney disease with heart failure and stage 1 through stage 4 chronic kidney disease, or unspecified chronic kidney disease: Secondary | ICD-10-CM | POA: Diagnosis not present

## 2016-10-30 DIAGNOSIS — I428 Other cardiomyopathies: Secondary | ICD-10-CM

## 2016-10-30 DIAGNOSIS — Z7902 Long term (current) use of antithrombotics/antiplatelets: Secondary | ICD-10-CM

## 2016-10-30 DIAGNOSIS — E781 Pure hyperglyceridemia: Secondary | ICD-10-CM | POA: Diagnosis not present

## 2016-10-30 DIAGNOSIS — I200 Unstable angina: Secondary | ICD-10-CM | POA: Diagnosis not present

## 2016-10-30 DIAGNOSIS — N183 Chronic kidney disease, stage 3 unspecified: Secondary | ICD-10-CM | POA: Diagnosis present

## 2016-10-30 DIAGNOSIS — I480 Paroxysmal atrial fibrillation: Secondary | ICD-10-CM | POA: Diagnosis present

## 2016-10-30 DIAGNOSIS — Z95810 Presence of automatic (implantable) cardiac defibrillator: Secondary | ICD-10-CM

## 2016-10-30 DIAGNOSIS — K219 Gastro-esophageal reflux disease without esophagitis: Secondary | ICD-10-CM | POA: Diagnosis present

## 2016-10-30 DIAGNOSIS — Z950 Presence of cardiac pacemaker: Secondary | ICD-10-CM | POA: Diagnosis present

## 2016-10-30 DIAGNOSIS — Z6839 Body mass index (BMI) 39.0-39.9, adult: Secondary | ICD-10-CM | POA: Diagnosis not present

## 2016-10-30 DIAGNOSIS — G4733 Obstructive sleep apnea (adult) (pediatric): Secondary | ICD-10-CM | POA: Diagnosis not present

## 2016-10-30 DIAGNOSIS — I255 Ischemic cardiomyopathy: Secondary | ICD-10-CM | POA: Diagnosis not present

## 2016-10-30 DIAGNOSIS — J45909 Unspecified asthma, uncomplicated: Secondary | ICD-10-CM | POA: Diagnosis present

## 2016-10-30 DIAGNOSIS — N186 End stage renal disease: Secondary | ICD-10-CM | POA: Diagnosis present

## 2016-10-30 DIAGNOSIS — I429 Cardiomyopathy, unspecified: Secondary | ICD-10-CM

## 2016-10-30 DIAGNOSIS — I4891 Unspecified atrial fibrillation: Secondary | ICD-10-CM | POA: Diagnosis present

## 2016-10-30 DIAGNOSIS — I25810 Atherosclerosis of coronary artery bypass graft(s) without angina pectoris: Secondary | ICD-10-CM | POA: Diagnosis not present

## 2016-10-30 DIAGNOSIS — J4530 Mild persistent asthma, uncomplicated: Secondary | ICD-10-CM | POA: Diagnosis not present

## 2016-10-30 DIAGNOSIS — I5032 Chronic diastolic (congestive) heart failure: Secondary | ICD-10-CM | POA: Diagnosis present

## 2016-10-30 DIAGNOSIS — Z23 Encounter for immunization: Secondary | ICD-10-CM | POA: Diagnosis not present

## 2016-10-30 DIAGNOSIS — I209 Angina pectoris, unspecified: Secondary | ICD-10-CM | POA: Diagnosis not present

## 2016-10-30 DIAGNOSIS — E1122 Type 2 diabetes mellitus with diabetic chronic kidney disease: Secondary | ICD-10-CM | POA: Diagnosis present

## 2016-10-30 DIAGNOSIS — E782 Mixed hyperlipidemia: Secondary | ICD-10-CM | POA: Diagnosis present

## 2016-10-30 DIAGNOSIS — Z7982 Long term (current) use of aspirin: Secondary | ICD-10-CM | POA: Diagnosis not present

## 2016-10-30 DIAGNOSIS — I25110 Atherosclerotic heart disease of native coronary artery with unstable angina pectoris: Secondary | ICD-10-CM | POA: Diagnosis not present

## 2016-10-30 DIAGNOSIS — R0602 Shortness of breath: Secondary | ICD-10-CM | POA: Diagnosis not present

## 2016-10-30 DIAGNOSIS — N189 Chronic kidney disease, unspecified: Secondary | ICD-10-CM | POA: Diagnosis present

## 2016-10-30 DIAGNOSIS — R079 Chest pain, unspecified: Secondary | ICD-10-CM | POA: Diagnosis not present

## 2016-10-30 DIAGNOSIS — Z955 Presence of coronary angioplasty implant and graft: Secondary | ICD-10-CM

## 2016-10-30 DIAGNOSIS — Z87891 Personal history of nicotine dependence: Secondary | ICD-10-CM

## 2016-10-30 HISTORY — DX: Type 2 diabetes mellitus without complications: E11.9

## 2016-10-30 HISTORY — DX: Pneumonia, unspecified organism: J18.9

## 2016-10-30 HISTORY — DX: Dependence on supplemental oxygen: Z99.81

## 2016-10-30 HISTORY — DX: Low back pain, unspecified: M54.50

## 2016-10-30 HISTORY — DX: Transient cerebral ischemic attack, unspecified: G45.9

## 2016-10-30 HISTORY — DX: Other chronic pain: G89.29

## 2016-10-30 HISTORY — DX: Unspecified chronic bronchitis: J42

## 2016-10-30 HISTORY — DX: Low back pain: M54.5

## 2016-10-30 HISTORY — DX: Pure hypercholesterolemia, unspecified: E78.00

## 2016-10-30 HISTORY — DX: Presence of automatic (implantable) cardiac defibrillator: Z95.810

## 2016-10-30 HISTORY — DX: Gout, unspecified: M10.9

## 2016-10-30 LAB — BASIC METABOLIC PANEL
Anion gap: 9 (ref 5–15)
BUN: 25 mg/dL — ABNORMAL HIGH (ref 6–20)
CO2: 29 mmol/L (ref 22–32)
Calcium: 8.8 mg/dL — ABNORMAL LOW (ref 8.9–10.3)
Chloride: 102 mmol/L (ref 101–111)
Creatinine, Ser: 1.46 mg/dL — ABNORMAL HIGH (ref 0.44–1.00)
GFR calc Af Amer: 40 mL/min — ABNORMAL LOW (ref >60)
GFR calc non Af Amer: 35 mL/min — ABNORMAL LOW (ref >60)
Glucose, Bld: 209 mg/dL — ABNORMAL HIGH (ref 65–99)
Potassium: 3.7 mmol/L (ref 3.5–5.1)
Sodium: 140 mmol/L (ref 135–145)

## 2016-10-30 LAB — I-STAT TROPONIN, ED: Troponin i, poc: 0.01 ng/mL (ref 0.00–0.08)

## 2016-10-30 LAB — CBC
HCT: 44.2 % (ref 36.0–46.0)
Hemoglobin: 13.3 g/dL (ref 12.0–15.0)
MCH: 27.5 pg (ref 26.0–34.0)
MCHC: 30.1 g/dL (ref 30.0–36.0)
MCV: 91.5 fL (ref 78.0–100.0)
Platelets: 138 10*3/uL — ABNORMAL LOW (ref 150–400)
RBC: 4.83 MIL/uL (ref 3.87–5.11)
RDW: 14.9 % (ref 11.5–15.5)
WBC: 6.3 10*3/uL (ref 4.0–10.5)

## 2016-10-30 LAB — GLUCOSE, CAPILLARY: Glucose-Capillary: 178 mg/dL — ABNORMAL HIGH (ref 65–99)

## 2016-10-30 LAB — HEMOGLOBIN A1C
Hgb A1c MFr Bld: 6.9 % — ABNORMAL HIGH (ref 4.8–5.6)
Mean Plasma Glucose: 151.33 mg/dL

## 2016-10-30 MED ORDER — SODIUM CHLORIDE 0.9 % IV SOLN: INTRAVENOUS | Status: DC

## 2016-10-30 MED ORDER — ISOSORBIDE MONONITRATE ER 30 MG PO TB24
30.0000 mg | ORAL_TABLET | Freq: Every day | ORAL | Status: DC
  Administered 2016-10-31 – 2016-11-01 (×2): 30 mg via ORAL
  Filled 2016-10-30 (×2): qty 1

## 2016-10-30 MED ORDER — IPRATROPIUM BROMIDE 0.06 % NA SOLN
2.0000 | Freq: Four times a day (QID) | NASAL | Status: DC
  Administered 2016-10-31 – 2016-11-01 (×2): 2 via NASAL
  Filled 2016-10-30 (×2): qty 15

## 2016-10-30 MED ORDER — POTASSIUM CHLORIDE CRYS ER 20 MEQ PO TBCR
20.0000 meq | EXTENDED_RELEASE_TABLET | Freq: Every day | ORAL | Status: DC
  Administered 2016-11-01: 20 meq via ORAL
  Filled 2016-10-30 (×2): qty 1

## 2016-10-30 MED ORDER — SODIUM CHLORIDE 0.9% FLUSH
3.0000 mL | Freq: Two times a day (BID) | INTRAVENOUS | Status: DC
  Administered 2016-10-30: 3 mL via INTRAVENOUS

## 2016-10-30 MED ORDER — TORSEMIDE 20 MG PO TABS
40.0000 mg | ORAL_TABLET | Freq: Every day | ORAL | Status: DC
  Administered 2016-10-31 – 2016-11-01 (×2): 40 mg via ORAL
  Filled 2016-10-30 (×2): qty 2

## 2016-10-30 MED ORDER — ASPIRIN 81 MG PO CHEW
324.0000 mg | CHEWABLE_TABLET | Freq: Once | ORAL | Status: AC
  Administered 2016-10-30: 324 mg via ORAL
  Filled 2016-10-30: qty 4

## 2016-10-30 MED ORDER — ALBUTEROL SULFATE (2.5 MG/3ML) 0.083% IN NEBU: 2.5000 mg | INHALATION_SOLUTION | Freq: Four times a day (QID) | RESPIRATORY_TRACT | Status: DC | PRN

## 2016-10-30 MED ORDER — HEPARIN SODIUM (PORCINE) 5000 UNIT/ML IJ SOLN
5000.0000 [IU] | Freq: Three times a day (TID) | INTRAMUSCULAR | Status: DC
  Administered 2016-10-30: 5000 [IU] via SUBCUTANEOUS
  Filled 2016-10-30: qty 1

## 2016-10-30 MED ORDER — ASPIRIN 81 MG PO CHEW
81.0000 mg | CHEWABLE_TABLET | ORAL | Status: AC
  Administered 2016-10-31: 81 mg via ORAL
  Filled 2016-10-30: qty 1

## 2016-10-30 MED ORDER — SPIRONOLACTONE 25 MG PO TABS
25.0000 mg | ORAL_TABLET | Freq: Every day | ORAL | Status: DC
  Administered 2016-10-31: 25 mg via ORAL
  Filled 2016-10-30 (×2): qty 1

## 2016-10-30 MED ORDER — ROSUVASTATIN CALCIUM 20 MG PO TABS
20.0000 mg | ORAL_TABLET | Freq: Every day | ORAL | Status: DC
  Administered 2016-10-30 – 2016-10-31 (×2): 20 mg via ORAL
  Filled 2016-10-30 (×3): qty 1

## 2016-10-30 MED ORDER — ADULT MULTIVITAMIN W/MINERALS CH
1.0000 | ORAL_TABLET | Freq: Every day | ORAL | Status: DC
  Administered 2016-10-31 – 2016-11-01 (×2): 1 via ORAL
  Filled 2016-10-30 (×2): qty 1

## 2016-10-30 MED ORDER — NITROGLYCERIN 0.4 MG SL SUBL
0.4000 mg | SUBLINGUAL_TABLET | SUBLINGUAL | Status: AC | PRN
  Administered 2016-10-30 (×3): 0.4 mg via SUBLINGUAL
  Filled 2016-10-30: qty 1

## 2016-10-30 MED ORDER — ACETAMINOPHEN 325 MG PO TABS
650.0000 mg | ORAL_TABLET | ORAL | Status: DC | PRN
  Administered 2016-10-30 – 2016-10-31 (×3): 650 mg via ORAL
  Filled 2016-10-30 (×3): qty 2

## 2016-10-30 MED ORDER — SODIUM CHLORIDE 0.9 % IV SOLN: 250.0000 mL | INTRAVENOUS | Status: DC | PRN

## 2016-10-30 MED ORDER — ALBUTEROL SULFATE HFA 108 (90 BASE) MCG/ACT IN AERS: 2.0000 | INHALATION_SPRAY | RESPIRATORY_TRACT | Status: DC | PRN

## 2016-10-30 MED ORDER — MOMETASONE FURO-FORMOTEROL FUM 200-5 MCG/ACT IN AERO
2.0000 | INHALATION_SPRAY | Freq: Two times a day (BID) | RESPIRATORY_TRACT | Status: DC
  Administered 2016-10-31 – 2016-11-01 (×2): 2 via RESPIRATORY_TRACT
  Filled 2016-10-30 (×2): qty 8.8

## 2016-10-30 MED ORDER — BISOPROLOL FUMARATE 5 MG PO TABS
5.0000 mg | ORAL_TABLET | Freq: Every day | ORAL | Status: DC
  Administered 2016-10-31: 5 mg via ORAL
  Filled 2016-10-30 (×3): qty 1

## 2016-10-30 MED ORDER — ASPIRIN EC 81 MG PO TBEC
81.0000 mg | DELAYED_RELEASE_TABLET | Freq: Every day | ORAL | Status: DC
  Administered 2016-11-01: 81 mg via ORAL
  Filled 2016-10-30: qty 1

## 2016-10-30 MED ORDER — ONDANSETRON HCL 4 MG/2ML IJ SOLN: 4.0000 mg | Freq: Four times a day (QID) | INTRAMUSCULAR | Status: DC | PRN

## 2016-10-30 MED ORDER — DOCUSATE SODIUM 100 MG PO CAPS
100.0000 mg | ORAL_CAPSULE | Freq: Every evening | ORAL | Status: DC
  Administered 2016-10-31: 100 mg via ORAL
  Filled 2016-10-30: qty 1

## 2016-10-30 MED ORDER — CLOPIDOGREL BISULFATE 75 MG PO TABS
75.0000 mg | ORAL_TABLET | Freq: Every day | ORAL | Status: DC
  Administered 2016-10-30: 75 mg via ORAL
  Filled 2016-10-30 (×3): qty 1

## 2016-10-30 MED ORDER — SODIUM CHLORIDE 0.9% FLUSH: 3.0000 mL | INTRAVENOUS | Status: DC | PRN

## 2016-10-30 NOTE — ED Notes (Signed)
Pt given water per Dr. Debara Pickett.

## 2016-10-30 NOTE — ED Provider Notes (Signed)
[facility] DEPT Provider Note   CSN: [id number] Arrival date & time: 10/30/16  1009     History   Chief Complaint Chief Complaint  Patient presents with  . Chest Pain    HPI [name] is a 71 y.o. female.  Patient is a 71 year old female with a history of coronary artery disease status post bypass surgery, CHF, diabetes, obesity, hypertension who presents with chest pain and shortness of breath. She states that she was driving her grandson to school and had an onset of feeling like her heart was beating fast associated with tightness across her chest and shortness of breath. She was also feeling lightheaded. She states this sensation lasted about 20 minutes and has resolved. She has had some recurrent episodes of the pain since arrival to the emergency department.  She states that she weighed about 5-6 pounds over her baseline weight yesterday. She denies any fevers. No nausea or vomiting. No diaphoresis. She did miss her dose of Lasix this morning.      Past Medical History:  Diagnosis Date  . Anemia   . Arthritis   . Asthma   . CAD (coronary artery disease)   . Cellulitis and abscess of foot 12/2014   RT FOOT  . CHF (congestive heart failure) ([place])   . Diabetes mellitus without complication ([place])   . Diverticulosis   . Facial numbness 02/2016  . Fatty liver disease, nonalcoholic   . Gastritis   . GERD (gastroesophageal reflux disease)   . H/O hiatal hernia   . Hx of CABG   . Hyperplastic colon polyp   . Hypertension   . IBS (irritable bowel syndrome)   . ICD (implantable cardiac defibrillator) in place   . Internal hemorrhoids   . Ischemic cardiomyopathy   . Obesity   . OSA (obstructive sleep apnea)   . Shortness of breath     Patient Active Problem List   Diagnosis Date Noted  . Vasomotor rhinitis 10/16/2016  . Right facial numbness 03/05/2016  . CKD (chronic kidney disease), stage III 03/05/2016  . Atrial fibrillation ([place]) 10/24/2015  . Chest  pain with moderate risk of acute coronary syndrome 10/23/2015  . TIA (transient ischemic attack) 10/22/2015  . Complete heart block ([place]) 06/22/2015  . Allergic drug rash due to anti-infective agent 04/29/2015  . Fatigue 02/14/2015  . Chronic diastolic heart failure ([place]) 02/14/2015  . Cellulitis 12/21/2014  . S/P CABG x 5 11/29/2014  . CAD, multiple vessel   . Acute diastolic HF (heart failure) ([place]) 11/23/2014  . Diarrhea 07/12/2014  . Generalized abdominal pain 07/12/2014  . Diastolic CHF, acute on chronic (HCC) 11/04/2013  . Mixed hypercholesterolemia and hypertriglyceridemia 04/22/2013  . Vertigo 04/22/2013  . Dyspnea on exertion 08/25/2012  . Allergic to IV contrast 07/23/2012  . Unstable angina ([place]) 07/22/2012  . Morbid (severe) obesity due to excess calories ([place]) 11/22/2011  . Nonischemic cardiomyopathy ([place]) 11/22/2011  . GERD (gastroesophageal reflux disease) 11/22/2011  . Back pain 11/22/2011  . OSA (obstructive sleep apnea) 11/22/2011  . HEMORRHOIDS-EXTERNAL 02/21/2010  . NAUSEA 02/21/2010  . ABDOMINAL PAIN -GENERALIZED 02/21/2010  . PERSONAL HX COLONIC POLYPS 02/21/2010  . ANEMIA 04/16/2007  . Essential hypertension 04/16/2007  . DIVERTICULOSIS, COLON 04/16/2007  . ARTHRITIS 04/16/2007  . INTERNAL HEMORRHOIDS WITHOUT MENTION COMP 04/12/2007  . Asthma 04/12/2007  . Cardiac resynchronization therapy pacemaker (CRT-P) in place 04/12/2007  . ACUTE GASTRITIS WITHOUT MENTION OF HEMORRHAGE 12/14/2006  . COLONIC POLYPS, HYPERPLASTIC 09/27/2002  . FATTY LIVER  DISEASE 02/16/2002    Past Surgical History:  Procedure Laterality Date  . ABDOMINAL ULTRASOUND  12/01/2011   Peripelvic cysts- #1- 2.4x1.9x2.3cm, #2-1.2x0.9x1.2cm  . ANKLE SURGERY     right  . APPENDECTOMY    . BIV PACEMAKER GENERATOR CHANGE OUT N/A 11/02/2012   Procedure: BIV PACEMAKER GENERATOR CHANGE OUT;  Surgeon: Sanda Klein, MD;  Location: [facility] CATH LAB;  Service: Cardiovascular;  Laterality: N/A;  .  CARDIAC CATHETERIZATION  05/17/1999   No significant coronary obstructive disease w/ mild 20% luminal irregularity of the first diag branch of the LAD  . CARDIAC CATHETERIZATION  07/08/2002   No significant CAD, moderately depressed LV systolic function  . CARDIAC CATHETERIZATION Bilateral 04/26/2007   Normal findings, recommend medical therapy  . CARDIAC CATHETERIZATION  02/18/2008   Moderate CAD, would benefit from having a functional study, recommend continue medical therapy  . CARDIAC CATHETERIZATION  07/23/2012   Medical therapy  . CARDIAC CATHETERIZATION N/A 11/24/2014   Procedure: Left Heart Cath and Coronary Angiography;  Surgeon: Troy Sine, MD;  Location: [facility] CV LAB;  Service: Cardiovascular;  Laterality: N/A;  . CARDIAC CATHETERIZATION  11/27/2014   Procedure: Intravascular Pressure Wire/FFR Study;  Surgeon: Peter M Martinique, MD;  Location: [facility] CV LAB;  Service: Cardiovascular;;  . CARDIAC DEFIBRILLATOR PLACEMENT    . CERVICAL SPINE SURGERY     plate   . CORONARY ANGIOGRAM  2010  . CORONARY ARTERY BYPASS GRAFT N/A 11/29/2014   Procedure: CORONARY ARTERY BYPASS GRAFTING x 5 (LIMA-LAD, SVG-D, SVG-OM1-OM2, SVG-PD);  Surgeon: Melrose Nakayama, MD;  Location: [facility];  Service: Open Heart Surgery;  Laterality: N/A;  . KNEE SURGERY     right  . LEFT HEART CATHETERIZATION WITH CORONARY ANGIOGRAM N/A 07/23/2012   Procedure: LEFT HEART CATHETERIZATION WITH CORONARY ANGIOGRAM;  Surgeon: Leonie Man, MD;  Location: [facility] CATH LAB;  Service: Cardiovascular;  Laterality: N/A;  Carlton Adam MYOVIEW  11/14/2011   Mild-moderate defect seen in Mid Inferolateral and Mid Anterolateral regions-consistant w/ infarct/scar. No significant ischemia demonstrated.  [name] PACEMAKER INSERTION  2001   BS BivICD  . RIGHT/LEFT HEART CATH AND CORONARY ANGIOGRAPHY N/A 10/09/2016   Procedure: RIGHT/LEFT HEART CATH AND CORONARY ANGIOGRAPHY;  Surgeon: Jolaine Artist, MD;  Location: [facility] CV LAB;   Service: Cardiovascular;  Laterality: N/A;  . TEE WITHOUT CARDIOVERSION N/A 11/29/2014   Procedure: TRANSESOPHAGEAL ECHOCARDIOGRAM (TEE);  Surgeon: Melrose Nakayama, MD;  Location: [facility];  Service: Open Heart Surgery;  Laterality: N/A;  . TRANSTHORACIC ECHOCARDIOGRAM  07/23/2012   EF 55-60%, normal-mild  . TUBAL LIGATION      OB History    No data available       Home Medications    Prior to Admission medications   Medication Sig Start Date End Date Taking? Authorizing Provider  acetaminophen (TYLENOL) 500 MG tablet Take 1,000 mg by mouth every 6 (six) hours as needed (for pain.).   Yes [provider]  albuterol (PROVENTIL) (2.5 MG/3ML) 0.083% nebulizer solution Take 2.5 mg by nebulization every 6 (six) hours as needed for wheezing or shortness of breath.   Yes [provider]  aspirin EC 81 MG tablet Take 1 tablet (81 mg total) by mouth daily. 03/27/16  Yes Kilroy, Luke K, PA-C  bisoprolol (ZEBETA) 5 MG tablet Take 1 tablet (5 mg total) by mouth daily. 08/19/16  Yes Croitoru, [name], MD  budesonide-formoterol (SYMBICORT) 160-4.5 MCG/ACT inhaler Inhale 2 puffs into the lungs 2 (two) times daily.  Yes [provider]  cetirizine (ZYRTEC) 10 MG tablet Take 10 mg by mouth daily.    Yes [provider]  clopidogrel (PLAVIX) 75 MG tablet Take 1 tablet (75 mg total) by mouth daily. Patient taking differently: Take 75 mg by mouth every evening.  12/07/15  Yes Almyra Deforest, PA  colchicine 0.6 MG tablet Take one tablet two times daily as needed. Patient taking differently: Take 0.6-1.2 mg by mouth daily as needed (for gout flares ups). Take one tablet two times daily as needed. 09/30/16  Yes Marybelle Killings, MD  docusate sodium (COLACE) 100 MG capsule Take 100-200 mg by mouth every evening.    Yes [provider]  EPINEPHrine 0.3 mg/0.3 mL IJ SOAJ injection Inject 0.3 mg into the muscle daily as needed (anaphylatic allergic reaction).  10/17/15  Yes  [provider]  fluticasone (FLONASE) 50 MCG/ACT nasal spray Place 1 spray into both nostrils daily as needed for rhinitis or allergies.    Yes [provider]  hydroxypropyl methylcellulose / hypromellose (ISOPTO TEARS / GONIOVISC) 2.5 % ophthalmic solution Place 1 drop into both eyes 4 (four) times daily as needed for dry eyes.    Yes [provider]  ipratropium (ATROVENT) 0.06 % nasal spray 1-2 pffs up to 4 x daily as needed Patient taking differently: Place 2 sprays into both nostrils 4 (four) times daily as needed. 1-2 pffs up to 4 x daily as needed 10/16/16  Yes Tanda Rockers, MD  isosorbide mononitrate (IMDUR) 30 MG 24 hr tablet TAKE 1 TABLET EVERY DAY 08/28/16  Yes Croitoru, [name], MD  metolazone (ZAROXOLYN) 5 MG tablet Take 1 tablet (5 mg total) by mouth as needed. Take only as directed by [facility] Patient taking differently: Take 5 mg by mouth daily.  07/21/16  Yes Eileen Stanford, PA-C  Multiple Vitamin (MULTIVITAMIN WITH MINERALS) TABS tablet Take 1 tablet by mouth daily.   Yes [provider]  omeprazole (PRILOSEC) 40 MG capsule Take 1 capsule (40 mg total) by mouth 2 (two) times daily before a meal. 07/12/15  Yes Nandigam, Venia Minks, MD  OXYGEN Inhale 2 L into the lungs See admin instructions. During waking hours as needed for low oxygen levels & at bedtime each night.   Yes [provider]  potassium chloride SA (K-DUR,KLOR-CON) 20 MEQ tablet Take 20 mEq by mouth daily.    Yes [provider]  rosuvastatin (CRESTOR) 20 MG tablet Take 1 tablet (20 mg total) by mouth daily. Patient taking differently: Take 20 mg by mouth every evening.  12/28/15 06/10/18 Yes Croitoru, [name], MD  spironolactone (ALDACTONE) 25 MG tablet Take 1 tablet (25 mg total) by mouth daily. Patient taking differently: Take 25 mg by mouth every evening.  12/28/15  Yes Croitoru, [name], MD  torsemide (DEMADEX) 20 MG tablet Take 2 tablets (40 mg total) by  mouth daily. 10/03/16  Yes Bensimhon, Shaune Pascal, MD  triamcinolone cream (KENALOG) 0.1 % Apply 1 application topically daily as needed (applied to affected itchy area(s)).  08/24/14  Yes [provider]  VENTOLIN HFA 108 (90 Base) MCG/ACT inhaler INHALE 2 PUFFS EVERY 4 HOURS AS NEEDED FOR WHEEZING 07/10/16  Yes Rigoberto Noel, MD    Family History Family History  Problem Relation Age of Onset  . Breast cancer Mother   . Diabetes Mother   . Heart disease Maternal Grandmother   . Kidney disease Maternal Grandmother   . Diabetes Maternal Grandmother   .  Glaucoma Maternal Aunt   . Heart disease Maternal Aunt   . Asthma Sister     Social History Social History  Substance Use Topics  . Smoking status: Former Smoker    Packs/day: 0.25    Years: 3.00    Types: Cigarettes    Quit date: 02/11/1968  . Smokeless tobacco: Never Used  . Alcohol use No     Allergies   Ivp dye [iodinated diagnostic agents]; Shellfish-derived products; Sulfa antibiotics; Uloric [febuxostat]; Iodine; Atorvastatin; Cephalexin; and Zithromax [azithromycin]   Review of Systems Review of Systems  Constitutional: Positive for fatigue. Negative for chills, diaphoresis and fever.  HENT: Negative for congestion, rhinorrhea and sneezing.   Eyes: Negative.   Respiratory: Positive for chest tightness and shortness of breath. Negative for cough.   Cardiovascular: Positive for palpitations and leg swelling. Negative for chest pain.  Gastrointestinal: Negative for abdominal pain, blood in stool, diarrhea, nausea and vomiting.  Genitourinary: Negative for difficulty urinating, flank pain, frequency and hematuria.  Musculoskeletal: Negative for arthralgias and back pain.  Skin: Negative for rash.  Neurological: Positive for light-headedness. Negative for dizziness, speech difficulty, weakness, numbness and headaches.     Physical Exam Updated Vital Signs BP 124/70   Pulse 62   Temp 98.1 F (36.7 C) (Oral)    Resp (!) 24   Ht 5\' 6"  (1.676 m)   Wt 111.1 kg (245 lb)   SpO2 99%   BMI 39.54 kg/m   Physical Exam  Constitutional: She is oriented to person, place, and time. She appears well-developed and well-nourished.  HENT:  Head: Normocephalic and atraumatic.  Eyes: Pupils are equal, round, and reactive to light.  Neck: Normal range of motion. Neck supple.  Cardiovascular: Normal rate, regular rhythm and normal heart sounds.   Pulmonary/Chest: Effort normal and breath sounds normal. No respiratory distress. She has no wheezes. She has no rales. She exhibits no tenderness.  Abdominal: Soft. Bowel sounds are normal. There is no tenderness. There is no rebound and no guarding.  Musculoskeletal: Normal range of motion. She exhibits edema (2+ pitting edema bilaterally).  Lymphadenopathy:    She has no cervical adenopathy.  Neurological: She is alert and oriented to person, place, and time.  Skin: Skin is warm and dry. No rash noted.  Psychiatric: She has a normal mood and affect.     ED Treatments / Results  Labs (all labs ordered are listed, but only abnormal results are displayed) Labs Reviewed  BASIC METABOLIC PANEL - Abnormal; Notable for the following:       Result Value   Glucose, Bld 209 (*)    BUN 25 (*)    Creatinine, Ser 1.46 (*)    Calcium 8.8 (*)    GFR calc non Af Amer 35 (*)    GFR calc Af Amer 40 (*)    All other components within normal limits  CBC - Abnormal; Notable for the following:    Platelets 138 (*)    All other components within normal limits  HEMOGLOBIN A1C  I-STAT TROPONIN, ED    EKG  EKG Interpretation  Date/Time:  Thursday October 30 2016 10:15:38 EDT Ventricular Rate:  73 PR Interval:  170 QRS Duration: 170 QT Interval:  470 QTC Calculation: 517 R Axis:   -112 Text Interpretation:  Atrial-sensed ventricular-paced rhythm Abnormal ECG Confirmed by Malvin Johns [phone]) on 10/30/2016 11:37:53 AM       Radiology Dg Chest 2 View  Result  Date: 10/30/2016 CLINICAL DATA:  Chest  pain, shortness of Breath EXAM: CHEST  2 VIEW COMPARISON:  09/18/2016 FINDINGS: Right AICD remains in place, unchanged. Prior CABG. Heart is borderline in size. No confluent opacities, effusions or edema. IMPRESSION: No active disease. Electronically Signed   By: Rolm Baptise M.D.   On: 10/30/2016 10:46    Procedures Procedures (including critical care time)  Medications Ordered in ED Medications  nitroGLYCERIN (NITROSTAT) SL tablet 0.4 mg (0.4 mg Sublingual Given 10/30/16 1350)  aspirin chewable tablet 324 mg (324 mg Oral Given 10/30/16 1339)     Initial Impression / Assessment and Plan / ED Course  I have reviewed the triage vital signs and the nursing notes.  Pertinent labs & imaging results that were available during my care of the patient were reviewed by me and considered in my medical decision making (see chart for details).     Patient does have anterior WHO PRESENTS WITH CHEST PAIN. SHE HAS A LONG-STANDING HISTORY OF CORONARY ARTERY DISEASE AND IS SCHEDULED TO GET A STENT PLACED NEXT WEEk.  I have consulted cardiology who will admit the patient.  Final Clinical Impressions(s) / ED Diagnoses   Final diagnoses:  Unstable angina pectoris [facility])    New Prescriptions New Prescriptions   No medications on file     Malvin Johns, MD 10/30/16 1521

## 2016-10-30 NOTE — ED Notes (Signed)
External catheter placed on pt per request.

## 2016-10-30 NOTE — H&P (Signed)
Cardiology Admission History and Physical:   Patient ID: [name]; MRN: [id number]; DOB: Oct 23, 1945   Admission date: 10/30/2016  Primary Care Provider: Darcus Austin, MD Primary Cardiologist: Dr. Sanda Klein Primary HF: Dr. Haroldine Laws   Chief Complaint:  Chest pain   Patient Profile:   [name] is a 71 y.o. female with a history of morbid obesity, DM, HTN, asthma, OSA, ICD, GERD, arthritis, CRT-D 2008 [employer] and CAD S/P CABG x5 11/29/2014 presented for chest pain.   Patient has DOE and frequent chest pain. Seen by Dr. Haroldine Laws. Dyspnea is multifactorial and may be largely related to her obesity and related restrictive lung physiology. R/L cath showed severe 3 V disease. Patent  LIMA to LAD, SVG to diag and SVG to RCA. Ostially occluded SVG to LCX and OM. Mild pHTN. Her sob felt from obesity. Discuss with interventional team given unprotected myocardium in Lcx territory. Plan for outpatient PCI with Dr. Martinique 11/04/16.   She is followed by Dr. Melvyn Novas for pulmonary evaluation. No sign airflow obstruction on PFT son 10/10/16. Trial of Symbicort.   History of Present Illness:   [name] presented for chest pain. She was driving her grandson to school and suddenly noted substernal chest pressure/tightness with SOB, lightheadedness and radiation to upper neck. S/s Resolved in 20-30 minutes. Son was nearby who brought patient here. Had recurrent pain while in ER. Resolved with SL nitro x 2 and ASA 324mg . Her weight has been fluctuating in past one week requiring additional metolazone. Recently dealing with increase GERD however symptoms this morning was not typical of GERD.   POC troponin 0.01. Scr 1.46. CXR without acute disease.   EKG:  The ECG that was done  Today  personally reviewed and demonstrates A paced rhythm  Past Medical History:  Diagnosis Date  . Anemia   . Arthritis   . Asthma   . CAD (coronary artery disease)   . Cellulitis and abscess of  foot 12/2014   RT FOOT  . CHF (congestive heart failure) ([place])   . Diabetes mellitus without complication ([place])   . Diverticulosis   . Facial numbness 02/2016  . Fatty liver disease, nonalcoholic   . Gastritis   . GERD (gastroesophageal reflux disease)   . H/O hiatal hernia   . Hx of CABG   . Hyperplastic colon polyp   . Hypertension   . IBS (irritable bowel syndrome)   . ICD (implantable cardiac defibrillator) in place   . Internal hemorrhoids   . Ischemic cardiomyopathy   . Obesity   . OSA (obstructive sleep apnea)   . Shortness of breath     Past Surgical History:  Procedure Laterality Date  . ABDOMINAL ULTRASOUND  12/01/2011   Peripelvic cysts- #1- 2.4x1.9x2.3cm, #2-1.2x0.9x1.2cm  . ANKLE SURGERY     right  . APPENDECTOMY    . BIV PACEMAKER GENERATOR CHANGE OUT N/A 11/02/2012   Procedure: BIV PACEMAKER GENERATOR CHANGE OUT;  Surgeon: Sanda Klein, MD;  Location: [place] CATH LAB;  Service: Cardiovascular;  Laterality: N/A;  . CARDIAC CATHETERIZATION  05/17/1999   No significant coronary obstructive disease w/ mild 20% luminal irregularity of the first diag branch of the LAD  . CARDIAC CATHETERIZATION  07/08/2002   No significant CAD, moderately depressed LV systolic function  . CARDIAC CATHETERIZATION Bilateral 04/26/2007   Normal findings, recommend medical therapy  . CARDIAC CATHETERIZATION  02/18/2008   Moderate CAD, would benefit from having a functional study, recommend continue medical therapy  .  CARDIAC CATHETERIZATION  07/23/2012   Medical therapy  . CARDIAC CATHETERIZATION N/A 11/24/2014   Procedure: Left Heart Cath and Coronary Angiography;  Surgeon: Troy Sine, MD;  Location: [place] CV LAB;  Service: Cardiovascular;  Laterality: N/A;  . CARDIAC CATHETERIZATION  11/27/2014   Procedure: Intravascular Pressure Wire/FFR Study;  Surgeon: Peter M Martinique, MD;  Location: [place] CV LAB;  Service: Cardiovascular;;  . CARDIAC DEFIBRILLATOR PLACEMENT    . CERVICAL  SPINE SURGERY     plate   . CORONARY ANGIOGRAM  2010  . CORONARY ARTERY BYPASS GRAFT N/A 11/29/2014   Procedure: CORONARY ARTERY BYPASS GRAFTING x 5 (LIMA-LAD, SVG-D, SVG-OM1-OM2, SVG-PD);  Surgeon: Melrose Nakayama, MD;  Location: [place] [facility];  Service: Open Heart Surgery;  Laterality: N/A;  . KNEE SURGERY     right  . LEFT HEART CATHETERIZATION WITH CORONARY ANGIOGRAM N/A 07/23/2012   Procedure: LEFT HEART CATHETERIZATION WITH CORONARY ANGIOGRAM;  Surgeon: Leonie Man, MD;  Location: [facility] CATH LAB;  Service: Cardiovascular;  Laterality: N/A;  Carlton Adam MYOVIEW  11/14/2011   Mild-moderate defect seen in Mid Inferolateral and Mid Anterolateral regions-consistant w/ infarct/scar. No significant ischemia demonstrated.  [name] PACEMAKER INSERTION  2001   BS BivICD  . RIGHT/LEFT HEART CATH AND CORONARY ANGIOGRAPHY N/A 10/09/2016   Procedure: RIGHT/LEFT HEART CATH AND CORONARY ANGIOGRAPHY;  Surgeon: Jolaine Artist, MD;  Location: [place] CV LAB;  Service: Cardiovascular;  Laterality: N/A;  . TEE WITHOUT CARDIOVERSION N/A 11/29/2014   Procedure: TRANSESOPHAGEAL ECHOCARDIOGRAM (TEE);  Surgeon: Melrose Nakayama, MD;  Location: [place];  Service: Open Heart Surgery;  Laterality: N/A;  . TRANSTHORACIC ECHOCARDIOGRAM  07/23/2012   EF 55-60%, normal-mild  . TUBAL LIGATION       Medications Prior to Admission: Prior to Admission medications   Medication Sig Start Date End Date Taking? Authorizing Provider  acetaminophen (TYLENOL) 500 MG tablet Take 1,000 mg by mouth every 6 (six) hours as needed (for pain.).   Yes [provider]  albuterol (PROVENTIL) (2.5 MG/3ML) 0.083% nebulizer solution Take 2.5 mg by nebulization every 6 (six) hours as needed for wheezing or shortness of breath.   Yes [provider]  aspirin EC 81 MG tablet Take 1 tablet (81 mg total) by mouth daily. 03/27/16  Yes Kilroy, Luke K, PA-C  bisoprolol (ZEBETA) 5 MG tablet Take 1 tablet (5 mg total) by mouth daily.  08/19/16  Yes Croitoru, [name], MD  budesonide-formoterol (SYMBICORT) 160-4.5 MCG/ACT inhaler Inhale 2 puffs into the lungs 2 (two) times daily.   Yes [provider]  cetirizine (ZYRTEC) 10 MG tablet Take 10 mg by mouth daily.    Yes [provider]  clopidogrel (PLAVIX) 75 MG tablet Take 1 tablet (75 mg total) by mouth daily. Patient taking differently: Take 75 mg by mouth every evening.  12/07/15  Yes Almyra Deforest, PA  colchicine 0.6 MG tablet Take one tablet two times daily as needed. Patient taking differently: Take 0.6-1.2 mg by mouth daily as needed (for gout flares ups). Take one tablet two times daily as needed. 09/30/16  Yes Marybelle Killings, MD  docusate sodium (COLACE) 100 MG capsule Take 100-200 mg by mouth every evening.    Yes [provider]  EPINEPHrine 0.3 mg/0.3 mL IJ SOAJ injection Inject 0.3 mg into the muscle daily as needed (anaphylatic allergic reaction).  10/17/15  Yes [provider]  fluticasone (FLONASE) 50 MCG/ACT nasal spray Place 1 spray into both nostrils daily as needed  for rhinitis or allergies.    Yes [provider]  hydroxypropyl methylcellulose / hypromellose (ISOPTO TEARS / GONIOVISC) 2.5 % ophthalmic solution Place 1 drop into both eyes 4 (four) times daily as needed for dry eyes.    Yes [provider]  ipratropium (ATROVENT) 0.06 % nasal spray 1-2 pffs up to 4 x daily as needed Patient taking differently: Place 2 sprays into both nostrils 4 (four) times daily as needed. 1-2 pffs up to 4 x daily as needed 10/16/16  Yes Tanda Rockers, MD  isosorbide mononitrate (IMDUR) 30 MG 24 hr tablet TAKE 1 TABLET EVERY DAY 08/28/16  Yes Croitoru, [name], MD  metolazone (ZAROXOLYN) 5 MG tablet Take 1 tablet (5 mg total) by mouth as needed. Take only as directed by [facility] Failure [facility] Patient taking differently: Take 5 mg by mouth daily.  07/21/16  Yes Eileen Stanford, PA-C  Multiple Vitamin (MULTIVITAMIN WITH MINERALS) TABS  tablet Take 1 tablet by mouth daily.   Yes [provider]  omeprazole (PRILOSEC) 40 MG capsule Take 1 capsule (40 mg total) by mouth 2 (two) times daily before a meal. 07/12/15  Yes Nandigam, Venia Minks, MD  OXYGEN Inhale 2 L into the lungs See admin instructions. During waking hours as needed for low oxygen levels & at bedtime each night.   Yes [provider]  potassium chloride SA (K-DUR,KLOR-CON) 20 MEQ tablet Take 20 mEq by mouth daily.    Yes [provider]  rosuvastatin (CRESTOR) 20 MG tablet Take 1 tablet (20 mg total) by mouth daily. Patient taking differently: Take 20 mg by mouth every evening.  12/28/15 06/10/18 Yes Croitoru, [name], MD  spironolactone (ALDACTONE) 25 MG tablet Take 1 tablet (25 mg total) by mouth daily. Patient taking differently: Take 25 mg by mouth every evening.  12/28/15  Yes Croitoru, [name], MD  torsemide (DEMADEX) 20 MG tablet Take 2 tablets (40 mg total) by mouth daily. 10/03/16  Yes Bensimhon, Shaune Pascal, MD  triamcinolone cream (KENALOG) 0.1 % Apply 1 application topically daily as needed (applied to affected itchy area(s)).  08/24/14  Yes [provider]  VENTOLIN HFA 108 (90 Base) MCG/ACT inhaler INHALE 2 PUFFS EVERY 4 HOURS AS NEEDED FOR WHEEZING 07/10/16  Yes Rigoberto Noel, MD  predniSONE (DELTASONE) 50 MG tablet Take 1 tab at 6 pm on 9/6, Take 1 tab at 12 am (midnight),Take 1 tab at 5 am on 9/7 10/16/16   Larey Dresser, MD     Allergies:    Allergies  Allergen Reactions  . Ivp Dye [Iodinated Diagnostic Agents] Shortness Of Breath    No reaction to PO contrast with non-ionic dye.06-25-2014/rsm  . Shellfish-Derived Products Anaphylaxis  . Sulfa Antibiotics Shortness Of Breath  . Uloric [Febuxostat] Nausea And Vomiting  . Iodine Hives  . Atorvastatin Other (See Comments)    Pt states "causes bilateral leg pain/cramps."  . Cephalexin Itching and Rash    Rash & itch after Keflex  . Zithromax [Azithromycin] Rash    Social  History:   Social History   Social History  . Marital status: Widowed    Spouse name: N/A  . Number of children: 5  . Years of education: N/A   Occupational History  . 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 Smoking status: Former Smoker    Packs/day: 0.25    Years: 3.00    Types: Cigarettes    Quit date: 02/11/1968  . Smokeless tobacco:  Never Used  . Alcohol use No  . Drug use: No  . Sexual activity: No   Other Topics Concern  . Not on file   Social History Narrative   Widowed last year. She lives with her two sons.    Family History:   The patient's family history includes Asthma in her sister; Breast cancer in her mother; Diabetes in her maternal grandmother and mother; Glaucoma in her maternal aunt; Heart disease in her maternal aunt and maternal grandmother; Kidney disease in her maternal grandmother.    ROS:  Please see the history of present illness.  All other ROS reviewed and negative.     Physical Exam/Data:   Vitals:   10/30/16 1022 10/30/16 1145 10/30/16 1200 10/30/16 1215  BP: (!) 143/76 (!) 106/59 115/65 (!) 101/57  Pulse: 72 68 65 68  Resp: 20 14  16   Temp: 98.1 F (36.7 C)     TempSrc: Oral     SpO2: 95% 93% 92%   Weight: 245 lb (111.1 kg)     Height: 5\' 6"  (1.676 m)      No intake or output data in the 24 hours ending 10/30/16 1325 Filed Weights   10/30/16 1022  Weight: 245 lb (111.1 kg)   Body mass index is 39.54 kg/m.  General:  Obese female in no acute distress HEENT: normal Lymph: no adenopathy Neck: no JVD Endocrine:  No thryomegaly Vascular: No carotid bruits; FA pulses 2+ bilaterally without bruits  Cardiac:  normal S1, S2; RRR; no murmur  Lungs:  clear to auscultation bilaterally, no wheezing, rhonchi or rales  Abd: soft, nontender, no hepatomegaly  Ext: Trace edema bilaterally Musculoskeletal:  No deformities, BUE and BLE strength normal and equal Skin: warm and dry. Diffuse rash Neuro:  CNs 2-12  intact, no focal abnormalities noted Psych:  Normal affect   Relevant CV Studies: RIGHT/LEFT HEART CATH AND CORONARY ANGIOGRAPHY  Conclusion     Mid RCA lesion, 90 %stenosed.  1st Mrg lesion, 85 %stenosed.  1st Diag lesion, 70 %stenosed.  Ost 2nd Diag to 2nd Diag lesion, 70 %stenosed.  Prox RCA lesion, 100 %stenosed.  Prox Cx to Dist Cx lesion, 90 %stenosed.  Ost 1st Diag lesion, 80 %stenosed.  Mid LAD to Dist LAD lesion, 100 %stenosed.  LIMA.  SVG.  Origin lesion, 100 %stenosed.   Findings:  Ao = 117/61 (83) LV = 126/8 RA = 11 RV = 39/10 PA = 37/16 (25) PCW = 13 Fick cardiac output/index = 5.1/2.3 PVR = 2.4 WU FA sat = 95% PA sat = 65%  Assessment: 1. Severe 3v CAD 2. LIMA  to LAD widely patent 3. SVG to Diagonal widely patent 4. SVG to RCA widely patent 5. SVG to LCx and OM occluded ostially 6. LVEF 60% 7. Mild PAH with normal PCWP and output  Plan/Discussion:  Suspect main cause of her SOB is her obesity but she is having some CP and has a large area of unprotected myocardium in LCX territory. Will d/w interventional team regarding PCI. Discussed with Dr. Recardo Evangelist as well.     Diagnostic Diagram       Echo 10/24/15 Study Conclusions  - Left ventricle: The cavity size was normal. There was mild   concentric hypertrophy. Systolic function was normal. The   estimated ejection fraction was in the range of 55% to 60%. Wall   motion was normal; there were no regional wall motion   abnormalities. Doppler parameters are consistent with abnormal  left ventricular relaxation (grade 1 diastolic dysfunction). - Mitral valve: Calcified annulus. Mildly thickened, mildly   calcified leaflets . - Right ventricle: Pacer wire or catheter noted in right ventricle.  Impressions:  - No cardiac source of emboli was indentified. Laboratory Data:  Chemistry Recent Labs Lab 10/30/16 1023  NA 140  K 3.7  CL 102  CO2 29  GLUCOSE 209*  BUN 25*   CREATININE 1.46*  CALCIUM 8.8*  GFRNONAA 35*  GFRAA 40*  ANIONGAP 9    Hematology Recent Labs Lab 10/30/16 1023  WBC 6.3  RBC 4.83  HGB 13.3  HCT 44.2  MCV 91.5  MCH 27.5  MCHC 30.1  RDW 14.9  PLT 138*    Recent Labs Lab 10/30/16 1053  TROPIPOC 0.01    Radiology/Studies:  Dg Chest 2 View  Result Date: 10/30/2016 CLINICAL DATA:  Chest pain, shortness of Breath EXAM: CHEST  2 VIEW COMPARISON:  09/18/2016 FINDINGS: Right AICD remains in place, unchanged. Prior CABG. Heart is borderline in size. No confluent opacities, effusions or edema. IMPRESSION: No active disease. Electronically Signed   By: Rolm Baptise M.D.   On: 10/30/2016 10:46    Assessment and Plan:   1. Unstable angina with hx of CAD s/p CABG - Recent diagnostic cath as above. Plan for LCX PCI 11/04/16. Now presenting with [place]. Responsive to  SL nitro. Admit and cycle troponin. Cath tomorrow. Continue ASA and Plavix.   2. Chronic diastolic CHF - Echo 7/82 with LVEF of 55-60%. Requited few dose of metolazone last week. Lungs clear. Mild mild PAH with normal PCWP and output by R heart cath. Lungs clear.   3. CKD stage III - SCr stable  4. H/o heart block s/p BiV pacer/CRT-D [employer] - Followed by Dr. [name]  5. OSA - unable to tolerate CPAP    Severity of Illness: The appropriate patient status for this patient is INPATIENT. Inpatient status is judged to be reasonable and necessary in order to provide the required intensity of service to ensure the patient's safety. The patient's presenting symptoms, physical exam findings, and initial radiographic and laboratory data in the context of their chronic comorbidities is felt to place them at high risk for further clinical deterioration. Furthermore, it is not anticipated that the patient will be medically stable for discharge from the hospital within 2 midnights of admission. The following factors support the patient status of inpatient.   " The  patient's presenting symptoms include Chest pain  " The worrisome physical exam findings include obesity  " The initial radiographic and laboratory data are worrisome because of recent abnormal cath  " The chronic co-morbidities include obesity, hx of CABG, plan for complex PCI tomorrow   * I certify that at the point of admission it is my clinical judgment that the patient will require inpatient hospital care spanning beyond 2 midnights from the point of admission due to high intensity of service, high risk for further deterioration and high frequency of surveillance required.*    For questions or updates, please contact [place] Please consult [email or web address] for contact info under Cardiology/STEMI.    Jarrett Soho, PA  10/30/2016 1:25 PM

## 2016-10-30 NOTE — ED Notes (Signed)
Pt had to sit in chair so this RN could change the bed, upon getting back in bed pt noted that she had "3 or 4" out of 10 chest pain, pt was pain free prior to getting out of bed.

## 2016-10-30 NOTE — ED Triage Notes (Signed)
Pt has cardiac history with plans for stent next week, today pt had sudden onset of substernal chest pain while driving her grand son to school. Pt states feels like something is sitting on her chest.

## 2016-10-30 NOTE — ED Notes (Signed)
Attempted to call report

## 2016-10-30 NOTE — ED Notes (Signed)
Lab to add Hgb A1C

## 2016-10-31 ENCOUNTER — Encounter (HOSPITAL_COMMUNITY): Payer: Self-pay | Admitting: General Practice

## 2016-10-31 ENCOUNTER — Other Ambulatory Visit: Payer: Self-pay

## 2016-10-31 ENCOUNTER — Encounter (HOSPITAL_COMMUNITY)
Admission: EM | Disposition: A | Discharge status: Home / Self Care | Payer: Self-pay | Source: Home / Self Care | Attending: Internal Medicine

## 2016-10-31 DIAGNOSIS — I10 Essential (primary) hypertension: Secondary | ICD-10-CM

## 2016-10-31 DIAGNOSIS — E782 Mixed hyperlipidemia: Secondary | ICD-10-CM

## 2016-10-31 DIAGNOSIS — I428 Other cardiomyopathies: Secondary | ICD-10-CM

## 2016-10-31 DIAGNOSIS — I25110 Atherosclerotic heart disease of native coronary artery with unstable angina pectoris: Principal | ICD-10-CM

## 2016-10-31 DIAGNOSIS — N183 Chronic kidney disease, stage 3 (moderate): Secondary | ICD-10-CM

## 2016-10-31 DIAGNOSIS — Z951 Presence of aortocoronary bypass graft: Secondary | ICD-10-CM

## 2016-10-31 HISTORY — PX: CORONARY STENT INTERVENTION: CATH118234

## 2016-10-31 LAB — BASIC METABOLIC PANEL
Anion gap: 7 (ref 5–15)
BUN: 22 mg/dL — ABNORMAL HIGH (ref 6–20)
CO2: 31 mmol/L (ref 22–32)
Calcium: 9.1 mg/dL (ref 8.9–10.3)
Chloride: 102 mmol/L (ref 101–111)
Creatinine, Ser: 1.27 mg/dL — ABNORMAL HIGH (ref 0.44–1.00)
GFR calc Af Amer: 48 mL/min — ABNORMAL LOW (ref >60)
GFR calc non Af Amer: 41 mL/min — ABNORMAL LOW (ref >60)
Glucose, Bld: 120 mg/dL — ABNORMAL HIGH (ref 65–99)
Potassium: 3.8 mmol/L (ref 3.5–5.1)
Sodium: 140 mmol/L (ref 135–145)

## 2016-10-31 LAB — CBC
HCT: 40.9 % (ref 36.0–46.0)
Hemoglobin: 12.1 g/dL (ref 12.0–15.0)
MCH: 27.4 pg (ref 26.0–34.0)
MCHC: 29.6 g/dL — ABNORMAL LOW (ref 30.0–36.0)
MCV: 92.7 fL (ref 78.0–100.0)
Platelets: 142 10*3/uL — ABNORMAL LOW (ref 150–400)
RBC: 4.41 MIL/uL (ref 3.87–5.11)
RDW: 15.3 % (ref 11.5–15.5)
WBC: 5.5 10*3/uL (ref 4.0–10.5)

## 2016-10-31 LAB — POCT ACTIVATED CLOTTING TIME
Activated Clotting Time: 384 seconds
Activated Clotting Time: 428 seconds
Activated Clotting Time: 445 seconds

## 2016-10-31 LAB — LIPID PANEL
Cholesterol: 166 mg/dL (ref 0–200)
HDL: 39 mg/dL — ABNORMAL LOW (ref >40)
LDL Cholesterol: 91 mg/dL (ref 0–99)
Total CHOL/HDL Ratio: 4.3 RATIO
Triglycerides: 181 mg/dL — ABNORMAL HIGH (ref <150)
VLDL: 36 mg/dL (ref 0–40)

## 2016-10-31 LAB — GLUCOSE, CAPILLARY
Glucose-Capillary: 150 mg/dL — ABNORMAL HIGH (ref 65–99)
Glucose-Capillary: 221 mg/dL — ABNORMAL HIGH (ref 65–99)
Glucose-Capillary: 308 mg/dL — ABNORMAL HIGH (ref 65–99)
Glucose-Capillary: 181 mg/dL — ABNORMAL HIGH (ref 65–99)

## 2016-10-31 LAB — PROTIME-INR
INR: 0.92
Prothrombin Time: 12.3 seconds (ref 11.4–15.2)

## 2016-10-31 SURGERY — CORONARY STENT INTERVENTION
Anesthesia: LOCAL

## 2016-10-31 MED ORDER — INSULIN ASPART 100 UNIT/ML ~~LOC~~ SOLN
0.0000 [IU] | Freq: Three times a day (TID) | SUBCUTANEOUS | Status: DC
  Administered 2016-10-31: 11 [IU] via SUBCUTANEOUS
  Administered 2016-11-01: 2 [IU] via SUBCUTANEOUS

## 2016-10-31 MED ORDER — LORATADINE 10 MG PO TABS
10.0000 mg | ORAL_TABLET | Freq: Every day | ORAL | Status: DC
  Administered 2016-10-31 – 2016-11-01 (×2): 10 mg via ORAL
  Filled 2016-10-31 (×2): qty 1

## 2016-10-31 MED ORDER — CLOPIDOGREL BISULFATE 300 MG PO TABS
ORAL_TABLET | ORAL | Status: DC | PRN
  Administered 2016-10-31: 75 mg via ORAL

## 2016-10-31 MED ORDER — ANGIOPLASTY BOOK
Freq: Once | Status: AC
  Administered 2016-10-31: 23:00:00
  Filled 2016-10-31: qty 1

## 2016-10-31 MED ORDER — INFLUENZA VAC SPLIT HIGH-DOSE 0.5 ML IM SUSY
0.5000 mL | PREFILLED_SYRINGE | INTRAMUSCULAR | Status: AC
  Administered 2016-11-01: 0.5 mL via INTRAMUSCULAR
  Filled 2016-10-31: qty 0.5

## 2016-10-31 MED ORDER — SODIUM CHLORIDE 0.9% FLUSH
3.0000 mL | Freq: Two times a day (BID) | INTRAVENOUS | Status: DC
  Administered 2016-10-31 (×2): 3 mL via INTRAVENOUS

## 2016-10-31 MED ORDER — MIDAZOLAM HCL 2 MG/2ML IJ SOLN
INTRAMUSCULAR | Status: DC | PRN
  Administered 2016-10-31 (×2): 1 mg via INTRAVENOUS

## 2016-10-31 MED ORDER — NITROGLYCERIN 1 MG/10 ML FOR IR/CATH LAB
INTRA_ARTERIAL | Status: DC | PRN
  Administered 2016-10-31 (×2): 200 ug via INTRACORONARY

## 2016-10-31 MED ORDER — CLOPIDOGREL BISULFATE 75 MG PO TABS
ORAL_TABLET | ORAL | Status: AC
  Filled 2016-10-31: qty 1

## 2016-10-31 MED ORDER — ALUM & MAG HYDROXIDE-SIMETH 200-200-20 MG/5ML PO SUSP
30.0000 mL | ORAL | Status: DC | PRN
  Administered 2016-10-31: 30 mL via ORAL
  Filled 2016-10-31: qty 30

## 2016-10-31 MED ORDER — VERAPAMIL HCL 2.5 MG/ML IV SOLN
INTRAVENOUS | Status: DC | PRN
  Administered 2016-10-31: 08:00:00 via INTRA_ARTERIAL

## 2016-10-31 MED ORDER — HEPARIN SODIUM (PORCINE) 1000 UNIT/ML IJ SOLN
INTRAMUSCULAR | Status: DC | PRN
  Administered 2016-10-31: 12000 [IU] via INTRAVENOUS

## 2016-10-31 MED ORDER — LIDOCAINE HCL (PF) 1 % IJ SOLN
INTRAMUSCULAR | Status: DC | PRN
  Administered 2016-10-31: 2 mL via INTRADERMAL

## 2016-10-31 MED ORDER — SODIUM CHLORIDE 0.9 % IV SOLN: INTRAVENOUS | Status: AC

## 2016-10-31 MED ORDER — MIDAZOLAM HCL 2 MG/2ML IJ SOLN
INTRAMUSCULAR | Status: AC
  Filled 2016-10-31: qty 2

## 2016-10-31 MED ORDER — HEPARIN SODIUM (PORCINE) 1000 UNIT/ML IJ SOLN
INTRAMUSCULAR | Status: AC
  Filled 2016-10-31: qty 1

## 2016-10-31 MED ORDER — IOPAMIDOL (ISOVUE-370) INJECTION 76%
INTRAVENOUS | Status: AC
  Filled 2016-10-31: qty 100

## 2016-10-31 MED ORDER — IOPAMIDOL (ISOVUE-370) INJECTION 76%
INTRAVENOUS | Status: DC | PRN
  Administered 2016-10-31: 130 mL via INTRA_ARTERIAL

## 2016-10-31 MED ORDER — LABETALOL HCL 5 MG/ML IV SOLN
10.0000 mg | INTRAVENOUS | Status: AC | PRN
Start: 2016-10-31 — End: 2016-10-31

## 2016-10-31 MED ORDER — DIPHENHYDRAMINE HCL 50 MG/ML IJ SOLN
25.0000 mg | Freq: Once | INTRAMUSCULAR | Status: AC
  Administered 2016-10-31: 25 mg via INTRAVENOUS
  Filled 2016-10-31: qty 1

## 2016-10-31 MED ORDER — VERAPAMIL HCL 2.5 MG/ML IV SOLN
INTRAVENOUS | Status: AC
  Filled 2016-10-31: qty 2

## 2016-10-31 MED ORDER — METHYLPREDNISOLONE SODIUM SUCC 125 MG IJ SOLR
125.0000 mg | Freq: Once | INTRAMUSCULAR | Status: AC
Start: 2016-10-31 — End: 2016-10-31
  Administered 2016-10-31: 125 mg via INTRAVENOUS
  Filled 2016-10-31: qty 2

## 2016-10-31 MED ORDER — HEPARIN (PORCINE) IN NACL 2-0.9 UNIT/ML-% IJ SOLN
INTRAMUSCULAR | Status: AC
  Filled 2016-10-31: qty 1000

## 2016-10-31 MED ORDER — SODIUM CHLORIDE 0.9% FLUSH: 3.0000 mL | INTRAVENOUS | Status: DC | PRN

## 2016-10-31 MED ORDER — HEPARIN (PORCINE) IN NACL 2-0.9 UNIT/ML-% IJ SOLN
INTRAMUSCULAR | Status: AC | PRN
  Administered 2016-10-31: 1000 mL via INTRA_ARTERIAL

## 2016-10-31 MED ORDER — FENTANYL CITRATE (PF) 100 MCG/2ML IJ SOLN
INTRAMUSCULAR | Status: AC
  Filled 2016-10-31: qty 2

## 2016-10-31 MED ORDER — HYDRALAZINE HCL 20 MG/ML IJ SOLN: 5.0000 mg | INTRAMUSCULAR | Status: AC | PRN

## 2016-10-31 MED ORDER — INSULIN ASPART 100 UNIT/ML ~~LOC~~ SOLN: 0.0000 [IU] | Freq: Every day | SUBCUTANEOUS | Status: DC

## 2016-10-31 MED ORDER — FENTANYL CITRATE (PF) 100 MCG/2ML IJ SOLN
INTRAMUSCULAR | Status: DC | PRN
  Administered 2016-10-31 (×2): 25 ug via INTRAVENOUS

## 2016-10-31 MED ORDER — SODIUM CHLORIDE 0.9 % IV SOLN: 250.0000 mL | INTRAVENOUS | Status: DC | PRN

## 2016-10-31 MED ORDER — LIVING BETTER WITH HEART FAILURE BOOK
Freq: Once | Status: AC
  Administered 2016-10-31: 23:00:00

## 2016-10-31 MED ORDER — LIDOCAINE HCL 2 % IJ SOLN
INTRAMUSCULAR | Status: AC
Start: 2016-10-31 — End: ?
  Filled 2016-10-31: qty 10

## 2016-10-31 SURGICAL SUPPLY — 22 items
BALLN SAPPHIRE 2.0X12 (BALLOONS) ×2
BALLN ~~LOC~~ EMERGE MR 2.5X15 (BALLOONS) ×2
BALLOON SAPPHIRE 2.0X12 (BALLOONS) ×1 IMPLANT
BALLOON ~~LOC~~ EMERGE MR 2.5X15 (BALLOONS) ×1 IMPLANT
CATH INFINITI JR4 5F (CATHETERS) ×2 IMPLANT
CATH VISTA GUIDE 6FR XB3 (CATHETERS) ×2 IMPLANT
DEVICE RAD COMP TR BAND LRG (VASCULAR PRODUCTS) ×2 IMPLANT
GLIDESHEATH SLEND SS 6F .021 (SHEATH) ×2 IMPLANT
GUIDEWIRE INQWIRE 1.5J.035X260 (WIRE) ×1 IMPLANT
HOVERMATT SINGLE USE (MISCELLANEOUS) ×2 IMPLANT
INQWIRE 1.5J .035X260CM (WIRE) ×2
KIT ENCORE 26 ADVANTAGE (KITS) ×2 IMPLANT
KIT HEART LEFT (KITS) ×2 IMPLANT
PACK CARDIAC CATHETERIZATION (CUSTOM PROCEDURE TRAY) ×2 IMPLANT
STENT SYNERGY DES 2.25X16 (Permanent Stent) ×4 IMPLANT
STENT SYNERGY DES 2.25X20 (Permanent Stent) ×2 IMPLANT
STENT SYNERGY DES 2.25X24 (Permanent Stent) ×2 IMPLANT
TRANSDUCER W/STOPCOCK (MISCELLANEOUS) ×2 IMPLANT
TUBING CIL FLEX 10 FLL-RA (TUBING) ×2 IMPLANT
WIRE COUGAR XT STRL 190CM (WIRE) ×2 IMPLANT
WIRE HI TORQ VERSACORE-J 145CM (WIRE) ×2 IMPLANT
WIRE HI TORQ WHISPER MS 190CM (WIRE) ×2 IMPLANT

## 2016-10-31 NOTE — Care Management Note (Signed)
Case Management Note  Patient Details  Name: [name] MRN: [id number] Date of Birth: October 16, 1945  Subjective/Objective:   From home, s/p coronary stent intervention, will be on plavix.                 Action/Plan: NCM will follow for dc needs.   Expected Discharge Date:                  Expected Discharge Plan:  Home/Self Care  In-House Referral:     Discharge planning Services  CM Consult  Post Acute Care Choice:    Choice offered to:     DME Arranged:    DME Agency:     HH Arranged:    [place] Agency:     Status of Service:  Completed, signed off  If discussed at [employer] of Stay Meetings, dates discussed:    Additional Comments:  Zenon Mayo, RN 10/31/2016, 2:50 PM

## 2016-10-31 NOTE — Progress Notes (Signed)
Capillary blood glucose 308 this evening.  Given solumedrol pre cath.  Patient states diet controlled at home.  A1C 6.5 one year ago.  Will cover with SSI per Jettie Booze, NP.

## 2016-10-31 NOTE — Interval H&P Note (Signed)
History and Physical Interval Note:  10/31/2016 7:30 AM  [name]  has presented today for cardiac cath/PCI of the circumflex artery with the diagnosis of unstable angina.  The various methods of treatment have been discussed with the patient and family. After consideration of risks, benefits and other options for treatment, the patient has consented to  Procedure(s): LEFT HEART CATH AND CORONARY ANGIOGRAPHY (N/A) as a surgical intervention .  The patient's history has been reviewed, patient examined, no change in status, stable for surgery.  I have reviewed the patient's chart and labs.  Questions were answered to the patient's satisfaction.    Cath Lab Visit (complete for each Cath Lab visit)  Clinical Evaluation Leading to the Procedure:   ACS: No.  Non-ACS:    Anginal Classification: CCS III  Anti-ischemic medical therapy: Maximal Therapy (2 or more classes of medications)  Non-Invasive Test Results: No non-invasive testing performed  Prior CABG: Previous CABG         Lauree Chandler

## 2016-10-31 NOTE — Progress Notes (Signed)
TR band removed at 14:10, pre site level 0, post site level 1, small bruise noted.  +2 pulses present, patient has no pain at this time.  Will continue to monitor.

## 2016-11-01 ENCOUNTER — Encounter (HOSPITAL_COMMUNITY): Payer: Self-pay | Admitting: Cardiology

## 2016-11-01 DIAGNOSIS — Z23 Encounter for immunization: Secondary | ICD-10-CM | POA: Diagnosis not present

## 2016-11-01 LAB — BASIC METABOLIC PANEL
Anion gap: 5 (ref 5–15)
BUN: 23 mg/dL — ABNORMAL HIGH (ref 6–20)
CO2: 31 mmol/L (ref 22–32)
Calcium: 8.8 mg/dL — ABNORMAL LOW (ref 8.9–10.3)
Chloride: 103 mmol/L (ref 101–111)
Creatinine, Ser: 1.41 mg/dL — ABNORMAL HIGH (ref 0.44–1.00)
GFR calc Af Amer: 42 mL/min — ABNORMAL LOW (ref >60)
GFR calc non Af Amer: 36 mL/min — ABNORMAL LOW (ref >60)
Glucose, Bld: 206 mg/dL — ABNORMAL HIGH (ref 65–99)
Potassium: 3.9 mmol/L (ref 3.5–5.1)
Sodium: 139 mmol/L (ref 135–145)

## 2016-11-01 LAB — CBC
HCT: 39.2 % (ref 36.0–46.0)
Hemoglobin: 11.9 g/dL — ABNORMAL LOW (ref 12.0–15.0)
MCH: 27.6 pg (ref 26.0–34.0)
MCHC: 30.4 g/dL (ref 30.0–36.0)
MCV: 91 fL (ref 78.0–100.0)
Platelets: 136 10*3/uL — ABNORMAL LOW (ref 150–400)
RBC: 4.31 MIL/uL (ref 3.87–5.11)
RDW: 14.6 % (ref 11.5–15.5)
WBC: 10 10*3/uL (ref 4.0–10.5)

## 2016-11-01 LAB — GLUCOSE, CAPILLARY: Glucose-Capillary: 145 mg/dL — ABNORMAL HIGH (ref 65–99)

## 2016-11-01 MED ORDER — PANTOPRAZOLE SODIUM 40 MG PO TBEC: 40.0000 mg | DELAYED_RELEASE_TABLET | Freq: Every day | ORAL | 3 refills | Status: DC

## 2016-11-01 NOTE — Discharge Summary (Signed)
Discharge Summary    Patient ID: [name],  MRN: [id number], DOB/AGE: 02-26-1945 71 y.o.  Admit date: 10/30/2016 Discharge date: 11/01/2016  Primary Care Provider: Darcus Austin Primary Cardiologist: Dr. [name] Primary HF:  Dr. Haroldine Laws  Discharge Diagnoses    Principal Problem:   Unstable angina [facility]) Active Problems:   Essential hypertension   Cardiac resynchronization therapy pacemaker (CRT-P) in place   Nonischemic cardiomyopathy ([place])   Mixed hypercholesterolemia and hypertriglyceridemia   S/P CABG x 5   Atrial fibrillation (HCC)   CKD (chronic kidney disease), stage III   Allergies Allergies  Allergen Reactions  . Ivp Dye [Iodinated Diagnostic Agents] Shortness Of Breath    No reaction to PO contrast with non-ionic dye.06-25-2014/rsm  . Shellfish-Derived Products Anaphylaxis  . Sulfa Antibiotics Shortness Of Breath  . Uloric [Febuxostat] Nausea And Vomiting  . Iodine Hives  . Atorvastatin Other (See Comments)    Pt states "causes bilateral leg pain/cramps."  . Cephalexin Itching and Rash    Rash & itch after Keflex  . Zithromax [Azithromycin] Rash    Diagnostic Studies/Procedures    CORONARY STENT INTERVENTION  10/31/16  Conclusion   1. Unstable angina 2. Successful PTCA/DES x 3 AV groove circumflex 3. Successful PTCA/DES x 1 intermediate branch  Recommendations: Continue DAPT with ASA and Plavix for one year.      ____________   History of Present Illness     [name] is a pleasant 71 year old female patient of Dr. [name] with a past medical history of morbid obesity, DM, HTN, asthma, OSA, ICD, GERD, arthritis, CRT-D 2008 [employer] and CAD S/P CABG x5 11/29/2014 who recently had presented with chest pain. She underwent cardiac catheterization on 10/09/16 by Dr. Haroldine Laws and was found to have three-vessel disease. It was felt that she had unprotected myocardium in the left circumflex territory and therefore she was  scheduled for outpatient PCI with Dr. Martinique on 11/04/2016. She presented on 9/20 for acute chest pain. She developed substernal chest pressure and tightness with radiation to her neck or driving. This resolved after about 20-30 minutes. She had some recurrent pain in the emergency department and received nitroglycerin with prompt relief. Initial troponin is 0.01. EKG shows a paced rhythm. She was admitted for planned repaet diasgnostic cath and possible PCI.   Hospital Course     Consultants: None  The patient underwent cardiac cath yesterday and underwent Successful PTCA/DES x 3 AV groove circumflex  and successful PTCA/DES x 1 intermediate branch. She will be continued on aspirin and Plavix for one year. She was premedicated with prednisone for the cath and has been running an elevated blood sugar which is better this morning. This morning she is doing well. She has been up walking in the halls without difficulty. She states that she is breathing much better and feels better than she has in months. She feels that she will now be able to walk her dog which she was not able to do for the last few months. Her lungs are clear and she has no peripheral edema but her face is puffy. She is also complaining of recent reflux type symptoms and difficulty swallowing. I've advised her to avoid eating prior to lying down and to avoid offending foods. I also advised her to see her primary care provider to further assess her symptoms. She is on a PPI. She will continue on all of her previous medications.  Right wrist cath site is without hematoma, tenderness, drainage.  SCr on admission was 1.46, improved to 1.27 prior to cath, 1.41 this morning which is about her baseline.  Hemoglobin is stable at 11.9   Patient has been seen by Dr. Harrington Challenger today and deemed ready for discharge home. All follow up appointments have been scheduled. Discharge medications are listed below. _____________  Discharge Vitals Blood  pressure (!) 135/54, pulse 72, temperature 98.7 F (37.1 C), temperature source Oral, resp. rate 16, height 5\' 6"  (1.676 m), weight 235 lb 14.3 oz (107 kg), SpO2 93 %.  Filed Weights   10/30/16 2033 10/31/16 0529 11/01/16 0506  Weight: 264 lb 1.6 oz (119.8 kg) 260 lb 9.6 oz (118.2 kg) 235 lb 14.3 oz (107 kg)   Physical Exam  Constitutional: She is oriented to person, place, and time. She appears well-developed and well-nourished. No distress.  Obese female  HENT:  Head: Normocephalic and atraumatic.  Neck: Normal range of motion.  No JVD seen, difficult to assess  Cardiovascular: Normal rate, regular rhythm and normal heart sounds.  Exam reveals no gallop and no friction rub.   No murmur heard. Pulmonary/Chest: Effort normal and breath sounds normal. No respiratory distress. She has no wheezes. She has no rales.  Abdominal: Soft.  Musculoskeletal: Normal range of motion. She exhibits no edema.  Neurological: She is alert and oriented to person, place, and time.  Skin: Skin is warm and dry.  Psychiatric: She has a normal mood and affect. Her behavior is normal. Thought content normal.    Labs & Radiologic Studies    CBC  Recent Labs  10/31/16 0314 11/01/16 0146  WBC 5.5 10.0  HGB 12.1 11.9*  HCT 40.9 39.2  MCV 92.7 91.0  PLT 142* 240*   Basic Metabolic Panel  Recent Labs  10/31/16 0314 11/01/16 0146  NA 140 139  K 3.8 3.9  CL 102 103  CO2 31 31  GLUCOSE 120* 206*  BUN 22* 23*  CREATININE 1.27* 1.41*  CALCIUM 9.1 8.8*   Liver Function Tests No results for input(s): AST, ALT, ALKPHOS, BILITOT, PROT, ALBUMIN in the last 72 hours. No results for input(s): LIPASE, AMYLASE in the last 72 hours. Cardiac Enzymes No results for input(s): CKTOTAL, CKMB, CKMBINDEX, TROPONINI in the last 72 hours. BNP Invalid input(s): POCBNP D-Dimer No results for input(s): DDIMER in the last 72 hours. Hemoglobin A1C  Recent Labs  10/30/16 1023  HGBA1C 6.9*   Fasting Lipid  Panel  Recent Labs  10/31/16 0314  CHOL 166  HDL 39*  LDLCALC 91  TRIG 181*  CHOLHDL 4.3   Thyroid Function Tests No results for input(s): TSH, T4TOTAL, T3FREE, THYROIDAB in the last 72 hours.  Invalid input(s): FREET3 _____________  Dg Chest 2 View  Result Date: 10/30/2016 CLINICAL DATA:  Chest pain, shortness of Breath EXAM: CHEST  2 VIEW COMPARISON:  09/18/2016 FINDINGS: Right AICD remains in place, unchanged. Prior CABG. Heart is borderline in size. No confluent opacities, effusions or edema. IMPRESSION: No active disease. Electronically Signed   By: Rolm Baptise M.D.   On: 10/30/2016 10:46   Disposition   Pt is being discharged home today in good condition.  Follow-up Plans & Appointments    Follow-up Information    Croitoru, [name], MD Follow up.   Specialty:  Cardiology Why:  You have a cardiology hospital follow up appointment with Kerin Ransom, PA on October 4th at 9:00.  Contact information: [address] [place] [address] [phone]  Discharge Instructions    (HEART FAILURE PATIENTS) Call MD:  Anytime you have any of the following symptoms: 1) 3 pound weight gain in 24 hours or 5 pounds in 1 week 2) shortness of breath, with or without a dry hacking cough 3) swelling in the hands, feet or stomach 4) if you have to sleep on extra pillows at night in order to breathe.    Complete by:  As directed    Amb Referral to Cardiac Rehabilitation    Complete by:  As directed    Diagnosis:   Coronary Stents PTCA     Diet - low sodium heart healthy    Complete by:  As directed    Discharge instructions    Complete by:  As directed    PLEASE REMEMBER TO BRING ALL OF YOUR MEDICATIONS TO EACH OF YOUR FOLLOW-UP OFFICE VISITS.  PLEASE ATTEND ALL SCHEDULED FOLLOW-UP APPOINTMENTS.   Activity: Increase activity slowly as tolerated. You may shower, but no soaking baths (or swimming) for 1 week. No driving for 24 hours. No lifting over 5 lbs for  1 week. No sexual activity for 1 week.   You May Return to Work: in 1 week (if applicable)  Wound Care: You may wash cath site gently with soap and water. Keep cath site clean and dry. If you notice pain, swelling, bleeding or pus at your cath site, please call [phone].   Increase activity slowly    Complete by:  As directed       Discharge Medications   Current Discharge Medication List    START taking these medications   Details  pantoprazole (PROTONIX) 40 MG tablet Take 1 tablet (40 mg total) by mouth daily. Qty: 30 tablet, Refills: 3      CONTINUE these medications which have NOT CHANGED   Details  acetaminophen (TYLENOL) 500 MG tablet Take 1,000 mg by mouth every 6 (six) hours as needed (for pain.).    albuterol (PROVENTIL) (2.5 MG/3ML) 0.083% nebulizer solution Take 2.5 mg by nebulization every 6 (six) hours as needed for wheezing or shortness of breath.    aspirin EC 81 MG tablet Take 1 tablet (81 mg total) by mouth daily. Qty: 90 tablet, Refills: 3   Associated Diagnoses: Chest pain with moderate risk of acute coronary syndrome; S/P CABG x 5; Nonischemic cardiomyopathy ([place]); OSA (obstructive sleep apnea); Essential hypertension; Other specified transient cerebral ischemias; CKD (chronic kidney disease), stage III; Paroxysmal atrial fibrillation ([place]); Morbid obesity due to excess calories (HCC)    bisoprolol (ZEBETA) 5 MG tablet Take 1 tablet (5 mg total) by mouth daily. Qty: 90 tablet, Refills: 3    budesonide-formoterol (SYMBICORT) 160-4.5 MCG/ACT inhaler Inhale 2 puffs into the lungs 2 (two) times daily.    cetirizine (ZYRTEC) 10 MG tablet Take 10 mg by mouth daily.     clopidogrel (PLAVIX) 75 MG tablet Take 1 tablet (75 mg total) by mouth daily. Qty: 90 tablet, Refills: 3    colchicine 0.6 MG tablet Take one tablet two times daily as needed. Qty: 30 tablet, Refills: 0    docusate sodium (COLACE) 100 MG capsule Take 100-200 mg by mouth every evening.       EPINEPHrine 0.3 mg/0.3 mL IJ SOAJ injection Inject 0.3 mg into the muscle daily as needed (anaphylatic allergic reaction).     fluticasone (FLONASE) 50 MCG/ACT nasal spray Place 1 spray into both nostrils daily as needed for rhinitis or allergies.     hydroxypropyl methylcellulose / hypromellose (ISOPTO  TEARS / GONIOVISC) 2.5 % ophthalmic solution Place 1 drop into both eyes 4 (four) times daily as needed for dry eyes.     ipratropium (ATROVENT) 0.06 % nasal spray 1-2 pffs up to 4 x daily as needed Qty: 15 mL, Refills: 12   Associated Diagnoses: Vasomotor rhinitis    isosorbide mononitrate (IMDUR) 30 MG 24 hr tablet TAKE 1 TABLET EVERY DAY Qty: 90 tablet, Refills: 1    metolazone (ZAROXOLYN) 5 MG tablet Take 1 tablet (5 mg total) by mouth as needed. Take only as directed by Heart Failure Clinic Qty: 5 tablet, Refills: 0    Multiple Vitamin (MULTIVITAMIN WITH MINERALS) TABS tablet Take 1 tablet by mouth daily.    OXYGEN Inhale 2 L into the lungs See admin instructions. During waking hours as needed for low oxygen levels & at bedtime each night.    potassium chloride SA (K-DUR,KLOR-CON) 20 MEQ tablet Take 20 mEq by mouth daily.     rosuvastatin (CRESTOR) 20 MG tablet Take 1 tablet (20 mg total) by mouth daily. Qty: 90 tablet, Refills: 3    spironolactone (ALDACTONE) 25 MG tablet Take 1 tablet (25 mg total) by mouth daily. Qty: 90 tablet, Refills: 3    torsemide (DEMADEX) 20 MG tablet Take 2 tablets (40 mg total) by mouth daily. Qty: 180 tablet, Refills: 3    triamcinolone cream (KENALOG) 0.1 % Apply 1 application topically daily as needed (applied to affected itchy area(s)).     VENTOLIN HFA 108 (90 Base) MCG/ACT inhaler INHALE 2 PUFFS EVERY 4 HOURS AS NEEDED FOR WHEEZING Qty: 18 g, Refills: 3      STOP taking these medications     omeprazole (PRILOSEC) 40 MG capsule           Outstanding Labs/Studies   None  Duration of Discharge Encounter   Greater than 30 minutes  including physician time.  Signed, Daune Perch NP 11/01/2016, 10:11 AM  Patient seen and examined  I have amended note above with physcial examination PT currently pain free  No CP   OK for discharge  Will make sure she has f/u with D Bensimhon.  Dorris Carnes

## 2016-11-01 NOTE — Progress Notes (Signed)
Pt walked 240 ft with RN. Sts she feels much better, she can already tell she is less SOB. Ed completed with good reception. She is eager to start exercising and interested in [place]. Will refer her to [place]. Understands importance of Plavix. Discussed daily wts, diet, ex, NTG as well. Receptive. Kraemer CES, ACSM 9:57 AM 11/01/2016

## 2016-11-03 ENCOUNTER — Encounter (HOSPITAL_COMMUNITY): Payer: Self-pay | Admitting: Cardiovascular Disease

## 2016-11-03 DIAGNOSIS — J4520 Mild intermittent asthma, uncomplicated: Secondary | ICD-10-CM | POA: Diagnosis not present

## 2016-11-03 DIAGNOSIS — N183 Chronic kidney disease, stage 3 (moderate): Secondary | ICD-10-CM | POA: Diagnosis not present

## 2016-11-03 DIAGNOSIS — I5032 Chronic diastolic (congestive) heart failure: Secondary | ICD-10-CM | POA: Diagnosis not present

## 2016-11-03 DIAGNOSIS — E119 Type 2 diabetes mellitus without complications: Secondary | ICD-10-CM | POA: Diagnosis not present

## 2016-11-03 DIAGNOSIS — I2510 Atherosclerotic heart disease of native coronary artery without angina pectoris: Secondary | ICD-10-CM | POA: Diagnosis not present

## 2016-11-03 DIAGNOSIS — I5033 Acute on chronic diastolic (congestive) heart failure: Secondary | ICD-10-CM | POA: Diagnosis not present

## 2016-11-03 DIAGNOSIS — E1122 Type 2 diabetes mellitus with diabetic chronic kidney disease: Secondary | ICD-10-CM | POA: Diagnosis not present

## 2016-11-03 DIAGNOSIS — I10 Essential (primary) hypertension: Secondary | ICD-10-CM | POA: Diagnosis not present

## 2016-11-04 ENCOUNTER — Encounter (HOSPITAL_COMMUNITY): Admission: RE | Payer: Self-pay | Source: Ambulatory Visit

## 2016-11-04 ENCOUNTER — Ambulatory Visit (HOSPITAL_COMMUNITY): Admission: RE | Admit: 2016-11-04 | Payer: Medicare HMO | Source: Ambulatory Visit | Admitting: Cardiology

## 2016-11-04 ENCOUNTER — Telehealth (HOSPITAL_COMMUNITY): Payer: Self-pay

## 2016-11-04 SURGERY — CORONARY STENT INTERVENTION
Anesthesia: LOCAL

## 2016-11-04 NOTE — Telephone Encounter (Signed)
Patient insurance is active and benefits verified. Patient insurance is [employer]- no-co-payment, deductible $183.00/$183.00 has been met, out of pocket $6700/$2160.29, 20% co-insurance and no pre-authorization. Passport/reference # [id number]. Medicaid- no co-payment, no deductible, no out of pocket, no co-insurance and no pre-authorization. Passport/reference [phone].

## 2016-11-13 ENCOUNTER — Ambulatory Visit (INDEPENDENT_AMBULATORY_CARE_PROVIDER_SITE_OTHER): Payer: Medicare HMO | Admitting: Cardiology

## 2016-11-13 ENCOUNTER — Telehealth: Payer: Self-pay | Admitting: Cardiovascular Disease

## 2016-11-13 ENCOUNTER — Encounter: Payer: Self-pay | Admitting: Cardiology

## 2016-11-13 VITALS — BP 120/74 | HR 67 | Ht 66.0 in | Wt 254.9 lb

## 2016-11-13 DIAGNOSIS — I2510 Atherosclerotic heart disease of native coronary artery without angina pectoris: Secondary | ICD-10-CM

## 2016-11-13 DIAGNOSIS — N183 Chronic kidney disease, stage 3 unspecified: Secondary | ICD-10-CM

## 2016-11-13 DIAGNOSIS — Z9861 Coronary angioplasty status: Secondary | ICD-10-CM | POA: Diagnosis not present

## 2016-11-13 DIAGNOSIS — I428 Other cardiomyopathies: Secondary | ICD-10-CM | POA: Diagnosis not present

## 2016-11-13 DIAGNOSIS — Z950 Presence of cardiac pacemaker: Secondary | ICD-10-CM | POA: Diagnosis not present

## 2016-11-13 DIAGNOSIS — I200 Unstable angina: Secondary | ICD-10-CM

## 2016-11-13 DIAGNOSIS — I5032 Chronic diastolic (congestive) heart failure: Secondary | ICD-10-CM

## 2016-11-13 DIAGNOSIS — I5033 Acute on chronic diastolic (congestive) heart failure: Secondary | ICD-10-CM

## 2016-11-13 DIAGNOSIS — I10 Essential (primary) hypertension: Secondary | ICD-10-CM | POA: Diagnosis not present

## 2016-11-13 DIAGNOSIS — Z951 Presence of aortocoronary bypass graft: Secondary | ICD-10-CM

## 2016-11-13 DIAGNOSIS — G4733 Obstructive sleep apnea (adult) (pediatric): Secondary | ICD-10-CM

## 2016-11-13 LAB — BASIC METABOLIC PANEL
BUN/Creatinine Ratio: 20 (ref 12–28)
BUN: 28 mg/dL — ABNORMAL HIGH (ref 8–27)
CO2: 25 mmol/L (ref 20–29)
Calcium: 9.2 mg/dL (ref 8.7–10.3)
Chloride: 100 mmol/L (ref 96–106)
Creatinine, Ser: 1.42 mg/dL — ABNORMAL HIGH (ref 0.57–1.00)
GFR calc Af Amer: 43 mL/min/{1.73_m2} — ABNORMAL LOW (ref >59)
GFR calc non Af Amer: 37 mL/min/{1.73_m2} — ABNORMAL LOW (ref >59)
Glucose: 122 mg/dL — ABNORMAL HIGH (ref 65–99)
Potassium: 4.3 mmol/L (ref 3.5–5.2)
Sodium: 143 mmol/L (ref 134–144)

## 2016-11-13 MED ORDER — BISOPROLOL FUMARATE 5 MG PO TABS: 5.0000 mg | ORAL_TABLET | Freq: Every day | ORAL | 3 refills | Status: DC

## 2016-11-13 MED ORDER — NITROGLYCERIN 0.4 MG SL SUBL: 0.4000 mg | SUBLINGUAL_TABLET | SUBLINGUAL | 3 refills | Status: AC | PRN

## 2016-11-13 NOTE — Telephone Encounter (Signed)
Cardiac rehab is calling about medicaid forms. Routed to [place], CMA to follow up on forms with MD

## 2016-11-13 NOTE — Assessment & Plan Note (Signed)
CABG x 15 Nov 2014. L-LAD, S-Dx, S-RCA, S-CFX-OM

## 2016-11-13 NOTE — Assessment & Plan Note (Signed)
CHB in 2001s/p PTVDP-this was upgraded to a BiV ICD in '08 secondary to CM (EF 20%).  The device was downgraded at change out to a BS CRP device in 2014 following excellent response and normalization of her LVF. Since CABG her atrial leads have had high threshold.

## 2016-11-13 NOTE — Assessment & Plan Note (Signed)
ERV 22% by pfts 10/16/2016 c/w obesity effects  BMI 41

## 2016-11-13 NOTE — Telephone Encounter (Signed)
New message    Calling to find out about medicaid reimbursement forms she faxed over. Please call.

## 2016-11-13 NOTE — Assessment & Plan Note (Signed)
Admitted 10/30/16 with [place] (she was to have a scheduled PCI 11/04/16) MI r/o

## 2016-11-13 NOTE — Progress Notes (Addendum)
11/13/2016 [name]   1945/06/10  [id number]  Primary Physician Darcus Austin, MD Primary Cardiologist: Dr Croitoru/ Dr Haroldine Laws  HPI:  71 y/o morbidly obese female with a history of CHB in 2001, s/p PTVDP then. This was upgraded to a Biv ICD in 2008 secondary to CM (EF 20%). 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 She had CABG x 5 in 2016. Other problems include morbid obesity and chronic diastolic heart failure. She had a cath 10/09/16 fro chest pain which revealed an occluded SVG-CFX-OM. She was scheduled to have a staged intervention on 11/04/16 but ended up in the ED with chest pain 10/30/16. She ruled out for an MI (one POC marker) and underwent PCI 10/31/16 with a DES x3 to the AV groove CFX and DES x 1 to an intermediate branch. She was discharge 11/01/16 and is seen now in the office in follow up.   Pt reports she is doing "OK". She has chest pain but its unlike her admission symptoms. She has chronic DOE, back discomfort, and leg weakness which she attributes to obesity.    Current Outpatient Prescriptions  Medication Sig Dispense Refill  . acetaminophen (TYLENOL) 500 MG tablet Take 1,000 mg by mouth every 6 (six) hours as needed (for pain.).    [name] albuterol (PROVENTIL) (2.5 MG/3ML) 0.083% nebulizer solution Take 2.5 mg by nebulization every 6 (six) hours as needed for wheezing or shortness of breath.    [name] aspirin EC 81 MG tablet Take 1 tablet (81 mg total) by mouth daily. 90 tablet 3  . bisoprolol (ZEBETA) 5 MG tablet Take 1 tablet (5 mg total) by mouth daily. 90 tablet 3  . budesonide-formoterol (SYMBICORT) 160-4.5 MCG/ACT inhaler Inhale 2 puffs into the lungs 2 (two) times daily.    . cetirizine (ZYRTEC) 10 MG tablet Take 10 mg by mouth daily.     . clopidogrel (PLAVIX) 75 MG tablet Take 1 tablet (75 mg total) by mouth daily. (Patient taking differently: Take 75 mg by mouth every evening. ) 90 tablet 3  . colchicine 0.6 MG  tablet Take one tablet two times daily as needed. (Patient taking differently: Take 0.6-1.2 mg by mouth daily as needed (for gout flares ups). Take one tablet two times daily as needed.) 30 tablet 0  . docusate sodium (COLACE) 100 MG capsule Take 100-200 mg by mouth every evening.     [name] EPINEPHrine 0.3 mg/0.3 mL IJ SOAJ injection Inject 0.3 mg into the muscle daily as needed (anaphylatic allergic reaction).     . fluticasone (FLONASE) 50 MCG/ACT nasal spray Place 1 spray into both nostrils daily as needed for rhinitis or allergies.     . hydroxypropyl methylcellulose / hypromellose (ISOPTO TEARS / GONIOVISC) 2.5 % ophthalmic solution Place 1 drop into both eyes 4 (four) times daily as needed for dry eyes.     [name] ipratropium (ATROVENT) 0.06 % nasal spray 1-2 pffs up to 4 x daily as needed (Patient taking differently: Place 2 sprays into both nostrils 4 (four) times daily as needed. 1-2 pffs up to 4 x daily as needed) 15 mL 12  . isosorbide mononitrate (IMDUR) 30 MG 24 hr tablet TAKE 1 TABLET EVERY DAY 90 tablet 1  . metolazone (ZAROXOLYN) 5 MG tablet Take 1 tablet (5 mg total) by mouth as needed. Take only as directed by Heart Failure Clinic (Patient taking differently: Take 5 mg by mouth daily. ) 5  tablet 0  . Multiple Vitamin (MULTIVITAMIN WITH MINERALS) TABS tablet Take 1 tablet by mouth daily.    . OXYGEN Inhale 2 L into the lungs See admin instructions. During waking hours as needed for low oxygen levels & at bedtime each night.    . pantoprazole (PROTONIX) 40 MG tablet Take 1 tablet (40 mg total) by mouth daily. 30 tablet 3  . potassium chloride SA (K-DUR,KLOR-CON) 20 MEQ tablet Take 20 mEq by mouth daily.     . rosuvastatin (CRESTOR) 20 MG tablet Take 1 tablet (20 mg total) by mouth daily. (Patient taking differently: Take 20 mg by mouth every evening. ) 90 tablet 3  . spironolactone (ALDACTONE) 25 MG tablet Take 1 tablet (25 mg total) by mouth daily. (Patient taking differently: Take 25 mg by  mouth every evening. ) 90 tablet 3  . torsemide (DEMADEX) 20 MG tablet Take 2 tablets (40 mg total) by mouth daily. 180 tablet 3  . triamcinolone cream (KENALOG) 0.1 % Apply 1 application topically daily as needed (applied to affected itchy area(s)).     . VENTOLIN HFA 108 (90 Base) MCG/ACT inhaler INHALE 2 PUFFS EVERY 4 HOURS AS NEEDED FOR WHEEZING 18 g 3  . nitroGLYCERIN (NITROSTAT) 0.4 MG SL tablet Place 1 tablet (0.4 mg total) under the tongue every 5 (five) minutes as needed for chest pain. 25 tablet 3   No current facility-administered medications for this visit.     Allergies  Allergen Reactions  . Ivp Dye [Iodinated Diagnostic Agents] Shortness Of Breath    No reaction to PO contrast with non-ionic dye.06-25-2014/rsm  . Shellfish-Derived Products Anaphylaxis  . Sulfa Antibiotics Shortness Of Breath  . Uloric [Febuxostat] Nausea And Vomiting  . Iodine Hives  . Atorvastatin Other (See Comments)    Pt states "causes bilateral leg pain/cramps."  . Cephalexin Itching and Rash    Rash & itch after Keflex  . Zithromax [Azithromycin] Rash    Past Medical History:  Diagnosis Date  . AICD (automatic cardioverter/defibrillator) present   . Anemia   . Arthritis   . Asthma   . CAD (coronary artery disease)    a. 10/09/16 LHC: SVG->LAD patent, SVG->Diag patent, SVG->RCA patent, SVG->LCx occluded. EF 60%, b. 10/31/16 LHC DES to AV groove Circ, DES to intermed branch  . Cellulitis and abscess of foot 12/2014   RT FOOT  . CHF (congestive heart failure) ([place])   . Chronic bronchitis ([place])   . Chronic lower back pain   . Diverticulosis   . Facial numbness 02/2016  . Fatty liver disease, nonalcoholic   . Gastritis   . GERD (gastroesophageal reflux disease)   . Gout   . H/O hiatal hernia   . High cholesterol   . Hyperplastic colon polyp   . Hypertension   . IBS (irritable bowel syndrome)   . Internal hemorrhoids   . Ischemic cardiomyopathy   . Obesity   . On home oxygen therapy     "2L at night" (10/31/2016)  . OSA (obstructive sleep apnea)    "can't tolerate a mask" (10/30/2016)  . Pneumonia    "couple times" (10/31/2016)  . Shortness of breath   . TIA (transient ischemic attack)    "recently" (10/31/2016)  . Type II diabetes mellitus ([place])     Social History   Social History  . Marital status: Widowed    Spouse name: N/A  . Number of children: 5  . Years of education: N/A   Occupational History  .  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 Smoking status: Former Smoker    Packs/day: 0.25    Years: 3.00    Types: Cigarettes    Quit date: 02/11/1968  . Smokeless tobacco: Never Used  . Alcohol use No  . Drug use: No  . Sexual activity: No   Other Topics Concern  . Not on file   Social History Narrative   Widowed last year. She lives with her two sons.     Family History  Problem Relation Age of Onset  . Breast cancer Mother   . Diabetes Mother   . Heart disease Maternal Grandmother   . Kidney disease Maternal Grandmother   . Diabetes Maternal Grandmother   . Glaucoma Maternal Aunt   . Heart disease Maternal Aunt   . Asthma Sister      Review of Systems: General: negative for chills, fever, night sweats or weight changes.  Cardiovascular: negative for chest pain, dyspnea on exertion, edema, orthopnea, palpitations, paroxysmal nocturnal dyspnea or shortness of breath Dermatological: negative for rash Respiratory: negative for cough or wheezing Urologic: negative for hematuria Abdominal: negative for nausea, vomiting, diarrhea, bright red blood per rectum, melena, or hematemesis Neurologic: negative for visual changes, syncope, or dizziness All other systems reviewed and are otherwise negative except as noted above.    Blood pressure 120/74, pulse 67, height 5\' 6"  (1.676 m), weight 254 lb 14.4 oz (115.6 kg).  General appearance: alert, cooperative, no distress and morbidly obese Lungs: clear to auscultation  bilaterally Heart: regular rate and rhythm Extremities: extremities normal, atraumatic, no cyanosis or edema Pulses: 2+ DP pulses  Skin: Skin color, texture, turgor normal. No rashes or lesions Neurologic: Grossly normal  EKG V Paced  ASSESSMENT AND PLAN:   Chest pain with moderate risk of acute coronary syndrome Admitted 10/30/16 with [place] (she was to have a scheduled PCI 11/04/16) MI r/o  CAD S/P percutaneous coronary angioplasty PCI with DES 10/31/16 to OM and intermediate secondary to [place]. SVG-CFX/OM was occluded  S/P CABG x 5 CABG x 15 Nov 2014. L-LAD, S-Dx, S-RCA, S-CFX-OM  Unstable angina ([place]) Admitted 10/30/16 with [place] (she was to have a scheduled PCI 11/04/16) MI r/o  Cardiac resynchronization therapy pacemaker (CRT-P) in place CHB in 2001s/p PTVDP-this was upgraded to a BiV ICD in '08 secondary to CM (EF 20%).  The device was downgraded at change out to a BS CRP device in 2014 following excellent response and normalization of her LVF. Since CABG her atrial leads have had high threshold.    Nonischemic cardiomyopathy ([place]) Pt responded to BiV ICD- last EF 55-60% Sept 2017  Essential hypertension Controlled  Chronic diastolic heart failure ([place]) Followed in CHF clinic  CKD (chronic kidney disease), stage III ([place]) Last SCr 1.4 post PCI 11/01/16  Morbid (severe) obesity due to excess calories (HCC) ERV 22% by pfts 10/16/2016 c/w obesity effects  BMI 41  OSA (obstructive sleep apnea) C-pap intol   PLAN  I ordered a BMP today, discharge SCr was 1.4. She has a follow up with Dr Haroldine Laws 10/16. She can see dr [name] in 3 months. I encouraged her to f/u with cardiac rehab. We discussed diet.   Kerin Ransom PA-C 11/13/2016 9:32 AM

## 2016-11-13 NOTE — Assessment & Plan Note (Signed)
C-pap intol 

## 2016-11-13 NOTE — Assessment & Plan Note (Signed)
Last SCr 1.4 post PCI 11/01/16

## 2016-11-13 NOTE — Assessment & Plan Note (Signed)
PCI with DES 10/31/16 to OM and intermediate secondary to [place]. SVG-CFX/OM was occluded

## 2016-11-13 NOTE — Assessment & Plan Note (Signed)
Pt responded to BiV ICD- last EF 55-60% Sept 2017

## 2016-11-13 NOTE — Patient Instructions (Signed)
Medication Instructions: Your physician recommends that you continue on your current medications as directed. Please refer to the Current Medication list given to you today.  If you need a refill on your cardiac medications before your next appointment, please call your pharmacy.   Labwork: Your physician recommends that you have a BMET today.   Procedures/Testing: Cardiac rehab has been ordered for you.   Follow-Up: Your physician wants you to follow-up in: 3 months with Dr. [name].  Thank you for choosing [facility] at [facility]!!

## 2016-11-13 NOTE — Assessment & Plan Note (Signed)
Followed in CHF clinic

## 2016-11-13 NOTE — Progress Notes (Signed)
Thanks for the update- will follow up on this. If he is not available will get one of his partners to sign as I understand an APP can't sign this form.  Kerin Ransom PA-C 11/13/2016 2:30 PM

## 2016-11-13 NOTE — Assessment & Plan Note (Signed)
Controlled.  

## 2016-11-17 NOTE — Telephone Encounter (Signed)
[name], RN faxed these forms on Thursday afternoon.

## 2016-11-18 ENCOUNTER — Ambulatory Visit (INDEPENDENT_AMBULATORY_CARE_PROVIDER_SITE_OTHER): Payer: Medicare HMO | Admitting: *Deleted

## 2016-11-18 DIAGNOSIS — I442 Atrioventricular block, complete: Secondary | ICD-10-CM

## 2016-11-18 NOTE — Progress Notes (Signed)
Remote pacemaker transmission.   

## 2016-11-19 LAB — CUP PACEART REMOTE DEVICE CHECK
Battery Remaining Longevity: 90 mo
Battery Remaining Percentage: 100 %
Brady Statistic RA Percent Paced: 0 %
Brady Statistic RV Percent Paced: 100 %
Date Time Interrogation Session: 20181009110900
Implantable Lead Implant Date: 20080923
Implantable Lead Implant Date: 20080923
Implantable Lead Implant Date: 20080923
Implantable Lead Location: 753858
Implantable Lead Location: 753859
Implantable Lead Location: 753860
Implantable Lead Model: 4285
Implantable Lead Model: 4555
Implantable Lead Model: 7120
Implantable Lead Serial Number: 170220
Implantable Lead Serial Number: 486468
Implantable Pulse Generator Implant Date: 20140923
Lead Channel Impedance Value: 1068 Ohm
Lead Channel Impedance Value: 634 Ohm
Lead Channel Impedance Value: 853 Ohm
Lead Channel Pacing Threshold Amplitude: 0.7 V
Lead Channel Pacing Threshold Amplitude: 1.2 V
Lead Channel Pacing Threshold Pulse Width: 0.4 ms
Lead Channel Pacing Threshold Pulse Width: 0.8 ms
Lead Channel Setting Pacing Amplitude: 1.9 V
Lead Channel Setting Pacing Amplitude: 2 V
Lead Channel Setting Pacing Pulse Width: 0.4 ms
Lead Channel Setting Pacing Pulse Width: 0.8 ms
Lead Channel Setting Sensing Sensitivity: 2.5 mV
Lead Channel Setting Sensing Sensitivity: 2.5 mV
Pulse Gen Serial Number: 100731

## 2016-11-21 ENCOUNTER — Encounter: Payer: Self-pay | Admitting: Cardiology

## 2016-11-25 ENCOUNTER — Encounter (HOSPITAL_COMMUNITY): Payer: Medicare HMO | Admitting: Internal Medicine

## 2016-11-25 DIAGNOSIS — I10 Essential (primary) hypertension: Secondary | ICD-10-CM | POA: Diagnosis not present

## 2016-11-25 DIAGNOSIS — J9611 Chronic respiratory failure with hypoxia: Secondary | ICD-10-CM | POA: Diagnosis not present

## 2016-11-25 DIAGNOSIS — R269 Unspecified abnormalities of gait and mobility: Secondary | ICD-10-CM | POA: Diagnosis not present

## 2016-11-25 DIAGNOSIS — R0602 Shortness of breath: Secondary | ICD-10-CM | POA: Diagnosis not present

## 2016-11-25 DIAGNOSIS — I5032 Chronic diastolic (congestive) heart failure: Secondary | ICD-10-CM | POA: Diagnosis not present

## 2016-11-25 DIAGNOSIS — J4520 Mild intermittent asthma, uncomplicated: Secondary | ICD-10-CM | POA: Diagnosis not present

## 2016-11-26 ENCOUNTER — Other Ambulatory Visit: Payer: Self-pay | Admitting: Cardiovascular Disease

## 2016-12-01 DIAGNOSIS — G4733 Obstructive sleep apnea (adult) (pediatric): Secondary | ICD-10-CM | POA: Diagnosis not present

## 2016-12-01 DIAGNOSIS — J4530 Mild persistent asthma, uncomplicated: Secondary | ICD-10-CM | POA: Diagnosis not present

## 2016-12-02 ENCOUNTER — Other Ambulatory Visit: Payer: Self-pay | Admitting: *Deleted

## 2016-12-02 MED ORDER — PANTOPRAZOLE SODIUM 40 MG PO TBEC
40.0000 mg | DELAYED_RELEASE_TABLET | Freq: Every day | ORAL | 3 refills | Status: DC
Start: 2016-12-02 — End: 2016-12-05

## 2016-12-05 ENCOUNTER — Other Ambulatory Visit: Payer: Self-pay

## 2016-12-05 MED ORDER — PANTOPRAZOLE SODIUM 40 MG PO TBEC: 40.0000 mg | DELAYED_RELEASE_TABLET | Freq: Every day | ORAL | 3 refills | Status: DC

## 2016-12-09 ENCOUNTER — Telehealth (HOSPITAL_COMMUNITY): Payer: Self-pay

## 2016-12-09 ENCOUNTER — Telehealth: Payer: Self-pay | Admitting: Cardiovascular Disease

## 2016-12-09 NOTE — Telephone Encounter (Signed)
New message   Call from [facility] - cardiac rehab calling to request medicaid form be re-faxed [phone], did not receive on 10/8.

## 2016-12-09 NOTE — Telephone Encounter (Signed)
SPOKE TO [facility]-- INFORMED WILL NEED REFAX  MEDICAID ORDER FORM FOR CARDIAC REHAB  UNABLE TO LOCATE ORIGINAL

## 2016-12-09 NOTE — Telephone Encounter (Signed)
Called and spoke with patient in regards to Cardiac Rehab - Pt is interested. Scheduled orientation on 12/18/2016. She will be attending the 11:15am classes.

## 2016-12-10 ENCOUNTER — Other Ambulatory Visit: Payer: Self-pay | Admitting: *Deleted

## 2016-12-10 MED ORDER — PANTOPRAZOLE SODIUM 40 MG PO TBEC: 40.0000 mg | DELAYED_RELEASE_TABLET | Freq: Every day | ORAL | 3 refills | Status: DC

## 2016-12-10 NOTE — Telephone Encounter (Signed)
Faxed to cardiac rehab yesterday, 12/09/16.

## 2016-12-17 ENCOUNTER — Telehealth (HOSPITAL_COMMUNITY): Payer: Self-pay

## 2016-12-17 ENCOUNTER — Telehealth (HOSPITAL_COMMUNITY): Payer: Self-pay | Admitting: *Deleted

## 2016-12-17 NOTE — Telephone Encounter (Signed)
UPDATE: Patients insurance is active and benefits verified through [employer] - no co-pay, deductible amount of $183/$183 has been met, out of pocket amount of $6,700/$3,325.15 has been met, 20% co-insurance, and no pre-authorization is required. Passport/reference #[id number]

## 2016-12-17 NOTE — Telephone Encounter (Signed)
-----   Message from Sanda Klein, MD sent at 12/17/2016  2:19 PM EST ----- Regarding: RE: OK to monitor o2 sats and apply O2 therapy if needed Would recommend O2 saturation 92% or greater.  Okay to apply oxygen during exercise.  Thank you MCr ----- Message ----- From: Rowe Pavy, RN Sent: 12/17/2016   1:55 PM To: Sanda Klein, MD Subject: OK to monitor o2 sats and apply O2 therapy i#  Dr. Recardo Evangelist  The above patient referred to cardiac rehab s/p 9/21 DES x 4.  Noted in pt medical history the use of Oxygen therapy at bedtime.  We will monitor her O2 saturation during exercise.   What is an acceptable saturation for this pt?   If needed, may we apply oxygen therapy during exercise?  Thanks for your advisement Maurice Small RN, BSN Cardiac and Pulmonary Rehab Nurse Navigator

## 2016-12-18 ENCOUNTER — Encounter (HOSPITAL_COMMUNITY)
Admission: RE | Admit: 2016-12-18 | Discharge: 2016-12-18 | Disposition: A | Discharge status: Home / Self Care | Payer: Medicare HMO | Source: Ambulatory Visit | Attending: Cardiovascular Disease | Admitting: Cardiovascular Disease

## 2016-12-18 ENCOUNTER — Encounter (HOSPITAL_COMMUNITY): Payer: Self-pay

## 2016-12-18 VITALS — BP 144/82 | HR 85 | Ht 62.25 in | Wt 257.5 lb

## 2016-12-18 DIAGNOSIS — Z955 Presence of coronary angioplasty implant and graft: Secondary | ICD-10-CM

## 2016-12-18 NOTE — Progress Notes (Signed)
Cardiac Individual Treatment Plan  Patient Details  Name: [name] MRN: [id number] Date of Birth: 04-27-1945 Referring Provider:     CARDIAC REHAB PHASE II ORIENTATION from 12/18/2016 in [facility]  Referring Provider  Croitoru, Dani Gobble MD, and Glori Bickers MD      Initial Encounter Date:    CARDIAC REHAB PHASE II ORIENTATION from 12/18/2016 in [facility]  Date  12/18/16  Referring Provider  Croitoru, [name] MD, and Glori Bickers MD      Visit Diagnosis: Status post coronary artery stent placement 10/31/16 S/P DES CFX  Patient's Home Medications on Admission:  Current Outpatient Medications:  .  acetaminophen (TYLENOL) 500 MG tablet, Take 1,000 mg by mouth every 6 (six) hours as needed (for pain.)., Disp: , Rfl:  .  albuterol (PROVENTIL) (2.5 MG/3ML) 0.083% nebulizer solution, Take 2.5 mg by nebulization every 6 (six) hours as needed for wheezing or shortness of breath., Disp: , Rfl:  .  aspirin EC 81 MG tablet, Take 1 tablet (81 mg total) by mouth daily., Disp: 90 tablet, Rfl: 3 .  bisoprolol (ZEBETA) 5 MG tablet, Take 1 tablet (5 mg total) by mouth daily., Disp: 90 tablet, Rfl: 3 .  budesonide-formoterol (SYMBICORT) 160-4.5 MCG/ACT inhaler, Inhale 2 puffs into the lungs 2 (two) times daily., Disp: , Rfl:  .  cetirizine (ZYRTEC) 10 MG tablet, Take 10 mg by mouth daily. , Disp: , Rfl:  .  clopidogrel (PLAVIX) 75 MG tablet, Take 1 tablet (75 mg total) by mouth daily. (Patient taking differently: Take 75 mg by mouth every evening. ), Disp: 90 tablet, Rfl: 3 .  colchicine 0.6 MG tablet, Take one tablet two times daily as needed. (Patient taking differently: Take 0.6-1.2 mg by mouth daily as needed (for gout flares ups). Take one tablet two times daily as needed.), Disp: 30 tablet, Rfl: 0 .  docusate sodium (COLACE) 100 MG capsule, Take 100-200 mg by mouth every evening. , Disp: , Rfl:  .  EPINEPHrine 0.3 mg/0.3 mL  IJ SOAJ injection, Inject 0.3 mg into the muscle daily as needed (anaphylatic allergic reaction). , Disp: , Rfl:  .  fluticasone (FLONASE) 50 MCG/ACT nasal spray, Place 1 spray into both nostrils daily as needed for rhinitis or allergies. , Disp: , Rfl:  .  hydroxypropyl methylcellulose / hypromellose (ISOPTO TEARS / GONIOVISC) 2.5 % ophthalmic solution, Place 1 drop into both eyes 4 (four) times daily as needed for dry eyes. , Disp: , Rfl:  .  ipratropium (ATROVENT) 0.06 % nasal spray, 1-2 pffs up to 4 x daily as needed (Patient taking differently: Place 2 sprays into both nostrils 4 (four) times daily as needed. 1-2 pffs up to 4 x daily as needed), Disp: 15 mL, Rfl: 12 .  isosorbide mononitrate (IMDUR) 30 MG 24 hr tablet, TAKE 1 TABLET EVERY DAY, Disp: 90 tablet, Rfl: 1 .  metolazone (ZAROXOLYN) 5 MG tablet, Take 1 tablet (5 mg total) by mouth as needed. Take only as directed by [facility] (Patient taking differently: Take 5 mg by mouth daily. ), Disp: 5 tablet, Rfl: 0 .  Multiple Vitamin (MULTIVITAMIN WITH MINERALS) TABS tablet, Take 1 tablet by mouth daily., Disp: , Rfl:  .  nitroGLYCERIN (NITROSTAT) 0.4 MG SL tablet, Place 1 tablet (0.4 mg total) under the tongue every 5 (five) minutes as needed for chest pain., Disp: 25 tablet, Rfl: 3 .  OXYGEN, Inhale 2 L into the lungs See admin  instructions. During waking hours as needed for low oxygen levels & at bedtime each night., Disp: , Rfl:  .  pantoprazole (PROTONIX) 40 MG tablet, Take 1 tablet (40 mg total) by mouth daily., Disp: 90 tablet, Rfl: 3 .  potassium chloride SA (K-DUR,KLOR-CON) 20 MEQ tablet, Take 20 mEq by mouth daily. , Disp: , Rfl:  .  rosuvastatin (CRESTOR) 20 MG tablet, TAKE 1 TABLET EVERY DAY, Disp: 90 tablet, Rfl: 3 .  spironolactone (ALDACTONE) 25 MG tablet, TAKE 1 TABLET EVERY DAY, Disp: 90 tablet, Rfl: 3 .  torsemide (DEMADEX) 20 MG tablet, Take 2 tablets (40 mg total) by mouth daily., Disp: 180 tablet, Rfl: 3 .   triamcinolone cream (KENALOG) 0.1 %, Apply 1 application topically daily as needed (applied to affected itchy area(s)). , Disp: , Rfl:  .  VENTOLIN HFA 108 (90 Base) MCG/ACT inhaler, INHALE 2 PUFFS EVERY 4 HOURS AS NEEDED FOR WHEEZING, Disp: 18 g, Rfl: 3  Past Medical History: Past Medical History:  Diagnosis Date  . AICD (automatic cardioverter/defibrillator) present   . Anemia   . Arthritis   . Asthma   . CAD (coronary artery disease)    a. 10/09/16 LHC: SVG->LAD patent, SVG->Diag patent, SVG->RCA patent, SVG->LCx occluded. EF 60%, b. 10/31/16 LHC DES to AV groove Circ, DES to intermed branch  . Cellulitis and abscess of foot 12/2014   RT FOOT  . CHF (congestive heart failure) ([place])   . Chronic bronchitis ([place])   . Chronic lower back pain   . Diverticulosis   . Facial numbness 02/2016  . Fatty liver disease, nonalcoholic   . Gastritis   . GERD (gastroesophageal reflux disease)   . Gout   . H/O hiatal hernia   . High cholesterol   . Hyperplastic colon polyp   . Hypertension   . IBS (irritable bowel syndrome)   . Internal hemorrhoids   . Ischemic cardiomyopathy   . Obesity   . On home oxygen therapy    "2L at night" (10/31/2016)  . OSA (obstructive sleep apnea)    "can't tolerate a mask" (10/30/2016)  . Pneumonia    "couple times" (10/31/2016)  . Shortness of breath   . TIA (transient ischemic attack)    "recently" (10/31/2016)  . Type II diabetes mellitus ([facility])     Tobacco Use: Social History   Tobacco Use  Smoking Status Former Smoker  . Packs/day: 0.25  . Years: 3.00  . Pack years: 0.75  . Types: Cigarettes  . Last attempt to quit: 02/11/1968  . Years since quitting: 48.8  Smokeless Tobacco Never Used    Labs: Recent Chemical engineer    Labs for ITP Cardiac and Pulmonary Rehab Latest Ref Rng & Units 03/04/2016 10/09/2016 10/09/2016 10/30/2016 10/31/2016   Cholestrol 0 - 200 mg/dL - - - - 166   LDLCALC 0 - 99 mg/dL - - - - 91   HDL >40 mg/dL - - - - 39(L)    Trlycerides <150 mg/dL - - - - 181(H)   Hemoglobin A1c 4.8 - 5.6 % - - - 6.9(H) -   PHART 7.350 - 7.450 - 7.338(L) - - -   PCO2ART 32.0 - 48.0 mmHg - 57.7(H) - - -   HCO3 20.0 - 28.0 mmol/L - 31.0(H) 29.1(H) - -   TCO2 22 - 32 mmol/L 31 33(H) 31 - -   ACIDBASEDEF 0.0 - 2.0 mmol/L - - - - -   O2SAT % - 95.0 65.0 - -  Capillary Blood Glucose: Lab Results  Component Value Date   GLUCAP 145 (H) 11/01/2016   GLUCAP 181 (H) 10/31/2016   GLUCAP 308 (H) 10/31/2016   GLUCAP 221 (H) 10/31/2016   GLUCAP 150 (H) 10/31/2016     Exercise Target Goals: Date: 12/18/16  Exercise Program Goal: Individual exercise prescription set with THRR, safety & activity barriers. Participant demonstrates ability to understand and report RPE using BORG scale, to self-measure pulse accurately, and to acknowledge the importance of the exercise prescription.  Exercise Prescription Goal: Starting with aerobic activity 30 plus minutes a day, 3 days per week for initial exercise prescription. Provide home exercise prescription and guidelines that participant acknowledges understanding prior to discharge.  Activity Barriers & Risk Stratification: Activity Barriers & Cardiac Risk Stratification - 12/18/16 0925      Activity Barriers & Cardiac Risk Stratification   Activity Barriers  Joint Problems;Muscular Weakness;Shortness of Breath;Balance Concerns;Other (comment)    Comments  L broken shoulder, hx of L broken foot and ankle    Cardiac Risk Stratification  High       6 Minute Walk: 6 Minute Walk    Row Name 12/18/16 1242         6 Minute Walk   Phase  Initial     Distance  716 feet     Walk Time  3.25 minutes     # of Rest Breaks  3     MPH  2.5     METS  0.78     RPE  13     Perceived Dyspnea   3     Symptoms  Yes (comment)     Comments  fatigue/SOB. Pt took 3 rest breaks for a total of 2 minutes and 35 seconds     Resting HR  68 bpm     Resting BP  144/82     Resting Oxygen Saturation    96 %     Exercise Oxygen Saturation  during 6 min walk  94 %     Max Ex. HR  85 bpm     Max Ex. BP  164/84     2 Minute Post BP  122/72        Oxygen Initial Assessment:   Oxygen Re-Evaluation:   Oxygen Discharge (Final Oxygen Re-Evaluation):   Initial Exercise Prescription: Initial Exercise Prescription - 12/18/16 1200      Date of Initial Exercise RX and Referring Provider   Date  12/18/16    Referring Provider  Croitoru, [name] MD, and Bensimhon, Daniel MD      Recumbant Bike   Level  --    Minutes  --    METs  --      NuStep   Level  1    SPM  50    Minutes  15    METs  1      Arm Ergometer   Level  1    Minutes  15    METs  1      Prescription Details   Frequency (times per week)  3    Duration  Progress to 30 minutes of continuous aerobic without signs/symptoms of physical distress      Intensity   THRR 40-80% of Max Heartrate  59-118    Ratings of Perceived Exertion  11-15    Perceived Dyspnea  0-4      Progression   Progression  Continue to progress workloads to maintain intensity without signs/symptoms of physical distress.  Resistance Training   Training Prescription  Yes    Weight  1lb    Reps  10-15       Perform Capillary Blood Glucose checks as needed.  Exercise Prescription Changes:   Exercise Comments:   Exercise Goals and Review:  Exercise Goals    Row Name 12/18/16 0926             Exercise Goals   Increase Physical Activity  Yes       Intervention  Provide advice, education, support and counseling about physical activity/exercise needs.;Develop an individualized exercise prescription for aerobic and resistive training based on initial evaluation findings, risk stratification, comorbidities and participant's personal goals.       Expected Outcomes  Achievement of increased cardiorespiratory fitness and enhanced flexibility, muscular endurance and strength shown through measurements of functional capacity and personal  statement of participant.       Increase Strength and Stamina  Yes       Intervention  Provide advice, education, support and counseling about physical activity/exercise needs.;Develop an individualized exercise prescription for aerobic and resistive training based on initial evaluation findings, risk stratification, comorbidities and participant's personal goals.       Expected Outcomes  Achievement of increased cardiorespiratory fitness and enhanced flexibility, muscular endurance and strength shown through measurements of functional capacity and personal statement of participant.       Able to understand and use rate of perceived exertion (RPE) scale  Yes       Intervention  Provide education and explanation on how to use RPE scale       Expected Outcomes  Short Term: Able to use RPE daily in rehab to express subjective intensity level;Long Term:  Able to use RPE to guide intensity level when exercising independently       Able to understand and use Dyspnea scale  Yes       Intervention  Provide education and explanation on how to use Dyspnea scale       Expected Outcomes  Short Term: Able to use Dyspnea scale daily in rehab to express subjective sense of shortness of breath during exertion;Long Term: Able to use Dyspnea scale to guide intensity level when exercising independently       Knowledge and understanding of Target Heart Rate Range (THRR)  Yes       Intervention  Provide education and explanation of THRR including how the numbers were predicted and where they are located for reference       Expected Outcomes  Short Term: Able to state/look up THRR;Long Term: Able to use THRR to govern intensity when exercising independently;Short Term: Able to use daily as guideline for intensity in rehab       Able to check pulse independently  Yes       Intervention  Provide education and demonstration on how to check pulse in carotid and radial arteries.;Review the importance of being able to check your  own pulse for safety during independent exercise       Expected Outcomes  Short Term: Able to explain why pulse checking is important during independent exercise;Long Term: Able to check pulse independently and accurately       Understanding of Exercise Prescription  Yes       Intervention  Provide education, explanation, and written materials on patient's individual exercise prescription       Expected Outcomes  Short Term: Able to explain program exercise prescription;Long Term: Able to explain home exercise prescription to  exercise independently          Exercise Goals Re-Evaluation :    Discharge Exercise Prescription (Final Exercise Prescription Changes):   Nutrition:  Target Goals: Understanding of nutrition guidelines, daily intake of sodium 1500mg , cholesterol 200mg , calories 30% from fat and 7% or less from saturated fats, daily to have 5 or more servings of fruits and vegetables.  Biometrics: Pre Biometrics - 12/18/16 1300      Pre Biometrics   Height  5' 2.25" (1.581 m)    Weight  257 lb 8 oz (116.8 kg)    Waist Circumference  53 inches    Hip Circumference  52 inches    Waist to Hip Ratio  1.02 %    BMI (Calculated)  46.73    Triceps Skinfold  63 mm    % Body Fat  61.2 %    Grip Strength  28 kg    Single Leg Stand  1.23 seconds        Nutrition Therapy Plan and Nutrition Goals:   Nutrition Discharge: Nutrition Scores:   Nutrition Goals Re-Evaluation:   Nutrition Goals Re-Evaluation:   Nutrition Goals Discharge (Final Nutrition Goals Re-Evaluation):   Psychosocial: Target Goals: Acknowledge presence or absence of significant depression and/or stress, maximize coping skills, provide positive support system. Participant is able to verbalize types and ability to use techniques and skills needed for reducing stress and depression.  Initial Review & Psychosocial Screening: Initial Psych Review & Screening - 12/18/16 1204      Initial Review   Current  issues with  Current Stress Concerns    Source of Stress Concerns  Chronic Illness;Family;Financial    Comments  Yurani is taking care of a son and has little financial resources other than her Brandt?  Yes    Concerns  Recent loss of significant other    Comments  Patient husband passed away 2 years ago      Barriers   Psychosocial barriers to participate in program  The patient should benefit from training in stress management and relaxation.      Screening Interventions   Interventions  Encouraged to exercise;To provide support and resources with identified psychosocial needs;Provide feedback about the scores to participant       Quality of Life Scores: Quality of Life - 12/18/16 1250      Quality of Life Scores   Health/Function Pre  14.57 %    Socioeconomic Pre  25 %    Psych/Spiritual Pre  25.71 %    Family Pre  25.5 %    GLOBAL Pre  20.52 %       PHQ-9: Recent Review Flowsheet Data    Depression screen [facility] 2/9 12/18/2016 06/16/2016 11/30/2015 03/12/2014   Decreased Interest 0 0 0 0   Down, Depressed, Hopeless 1  0 0 1   PHQ - 2 Score 1 0 0 1     Interpretation of Total Score  Total Score Depression Severity:  1-4 = Minimal depression, 5-9 = Mild depression, 10-14 = Moderate depression, 15-19 = Moderately severe depression, 20-27 = Severe depression   Psychosocial Evaluation and Intervention:   Psychosocial Re-Evaluation:   Psychosocial Discharge (Final Psychosocial Re-Evaluation):   Vocational Rehabilitation: Provide vocational rehab assistance to qualifying candidates.   Vocational Rehab Evaluation & Intervention: Vocational Rehab - 12/18/16 1203      Initial Vocational Rehab Evaluation & Intervention   Assessment shows need  for Vocational Rehabilitation  Yes Mrs Zechman said he would like to work if possible   Mrs Zanella said he would like to work if possible      Education: Education Goals:  Education classes will be provided on a weekly basis, covering required topics. Participant will state understanding/return demonstration of topics presented.  Learning Barriers/Preferences: Learning Barriers/Preferences - 12/18/16 1150      Learning Barriers/Preferences   Learning Barriers  Sight    Learning Preferences  Written Material;Individual Instruction;Skilled Demonstration       Education Topics: Count Your Pulse:  -Group instruction provided by verbal instruction, demonstration, patient participation and written materials to support subject.  Instructors address importance of being able to find your pulse and how to count your pulse when at home without a heart monitor.  Patients get hands on experience counting their pulse with staff help and individually.   Heart Attack, Angina, and Risk Factor Modification:  -Group instruction provided by verbal instruction, video, and written materials to support subject.  Instructors address signs and symptoms of angina and heart attacks.    Also discuss risk factors for heart disease and how to make changes to improve heart health risk factors.   Functional Fitness:  -Group instruction provided by verbal instruction, demonstration, patient participation, and written materials to support subject.  Instructors address safety measures for doing things around the house.  Discuss how to get up and down off the floor, how to pick things up properly, how to safely get out of a chair without assistance, and balance training.   Meditation and Mindfulness:  -Group instruction provided by verbal instruction, patient participation, and written materials to support subject.  Instructor addresses importance of mindfulness and meditation practice to help reduce stress and improve awareness.  Instructor also leads participants through a meditation exercise.    Stretching for Flexibility and Mobility:  -Group instruction provided by verbal instruction,  patient participation, and written materials to support subject.  Instructors lead participants through series of stretches that are designed to increase flexibility thus improving mobility.  These stretches are additional exercise for major muscle groups that are typically performed during regular warm up and cool down.   Hands Only CPR:  -Group verbal, video, and participation provides a basic overview of AHA guidelines for community CPR. Role-play of emergencies allow participants the opportunity to practice calling for help and chest compression technique with discussion of AED use.   Hypertension: -Group verbal and written instruction that provides a basic overview of hypertension including the most recent diagnostic guidelines, risk factor reduction with self-care instructions and medication management.    Nutrition I class: Heart Healthy Eating:  -Group instruction provided by PowerPoint slides, verbal discussion, and written materials to support subject matter. The instructor gives an explanation and review of the Therapeutic Lifestyle Changes diet recommendations, which includes a discussion on lipid goals, dietary fat, sodium, fiber, plant stanol/sterol esters, sugar, and the components of a well-balanced, healthy diet.   Nutrition II class: Lifestyle Skills:  -Group instruction provided by PowerPoint slides, verbal discussion, and written materials to support subject matter. The instructor gives an explanation and review of label reading, grocery shopping for heart health, heart healthy recipe modifications, and ways to make healthier choices when eating out.   Diabetes Question & Answer:  -Group instruction provided by PowerPoint slides, verbal discussion, and written materials to support subject matter. The instructor gives an explanation and review of diabetes co-morbidities, pre- and post-prandial blood glucose goals, pre-exercise blood  glucose goals, signs, symptoms, and  treatment of hypoglycemia and hyperglycemia, and foot care basics.   Diabetes Blitz:  -Group instruction provided by PowerPoint slides, verbal discussion, and written materials to support subject matter. The instructor gives an explanation and review of the physiology behind type 1 and type 2 diabetes, diabetes medications and rational behind using different medications, pre- and post-prandial blood glucose recommendations and Hemoglobin A1c goals, diabetes diet, and exercise including blood glucose guidelines for exercising safely.    Portion Distortion:  -Group instruction provided by PowerPoint slides, verbal discussion, written materials, and food models to support subject matter. The instructor gives an explanation of serving size versus portion size, changes in portions sizes over the last 20 years, and what consists of a serving from each food group.   Stress Management:  -Group instruction provided by verbal instruction, video, and written materials to support subject matter.  Instructors review role of stress in heart disease and how to cope with stress positively.     Exercising on Your Own:  -Group instruction provided by verbal instruction, power point, and written materials to support subject.  Instructors discuss benefits of exercise, components of exercise, frequency and intensity of exercise, and end points for exercise.  Also discuss use of nitroglycerin and activating EMS.  Review options of places to exercise outside of rehab.  Review guidelines for sex with heart disease.   Cardiac Drugs I:  -Group instruction provided by verbal instruction and written materials to support subject.  Instructor reviews cardiac drug classes: antiplatelets, anticoagulants, beta blockers, and statins.  Instructor discusses reasons, side effects, and lifestyle considerations for each drug class.   Cardiac Drugs II:  -Group instruction provided by verbal instruction and written materials to  support subject.  Instructor reviews cardiac drug classes: angiotensin converting enzyme inhibitors (ACE-I), angiotensin II receptor blockers (ARBs), nitrates, and calcium channel blockers.  Instructor discusses reasons, side effects, and lifestyle considerations for each drug class.   Anatomy and Physiology of the Circulatory System:  Group verbal and written instruction and models provide basic cardiac anatomy and physiology, with the coronary electrical and arterial systems. Review of: AMI, Angina, Valve disease, Heart Failure, Peripheral Artery Disease, Cardiac Arrhythmia, Pacemakers, and the ICD.   Other Education:  -Group or individual verbal, written, or video instructions that support the educational goals of the cardiac rehab program.   Knowledge Questionnaire Score: Knowledge Questionnaire Score - 12/18/16 1150      Knowledge Questionnaire Score   Pre Score  13/24       Core Components/Risk Factors/Patient Goals at Admission: Personal Goals and Risk Factors at Admission - 12/18/16 1302      Core Components/Risk Factors/Patient Goals on Admission    Weight Management  Yes;Obesity;Weight Maintenance;Weight Loss    Intervention  Weight Management: Develop a combined nutrition and exercise program designed to reach desired caloric intake, while maintaining appropriate intake of nutrient and fiber, sodium and fats, and appropriate energy expenditure required for the weight goal.;Weight Management: Provide education and appropriate resources to help participant work on and attain dietary goals.;Weight Management/Obesity: Establish reasonable short term and long term weight goals.    Admit Weight  257 lb 8 oz (116.8 kg)    Expected Outcomes  Short Term: Continue to assess and modify interventions until short term weight is achieved;Long Term: Adherence to nutrition and physical activity/exercise program aimed toward attainment of established weight goal;Weight Maintenance: Understanding  of the daily nutrition guidelines, which includes 25-35% calories from fat, 7% or less  cal from saturated fats, less than 200mg  cholesterol, less than 1.5gm of sodium, & 5 or more servings of fruits and vegetables daily;Weight Loss: Understanding of general recommendations for a balanced deficit meal plan, which promotes 1-2 lb weight loss per week and includes a negative energy balance of [phone] kcal/d;Understanding recommendations for meals to include 15-35% energy as protein, 25-35% energy from fat, 35-60% energy from carbohydrates, less than 200mg  of dietary cholesterol, 20-35 gm of total fiber daily;Understanding of distribution of calorie intake throughout the day with the consumption of 4-5 meals/snacks    Improve shortness of breath with ADL's  Yes    Intervention  Provide education, individualized exercise plan and daily activity instruction to help decrease symptoms of SOB with activities of daily living.    Expected Outcomes  Short Term: Achieves a reduction of symptoms when performing activities of daily living.    Diabetes  Yes    Intervention  Provide education about signs/symptoms and action to take for hypo/hyperglycemia.;Provide education about proper nutrition, including hydration, and aerobic/resistive exercise prescription along with prescribed medications to achieve blood glucose in normal ranges: Fasting glucose 65-99 mg/dL    Expected Outcomes  Short Term: Participant verbalizes understanding of the signs/symptoms and immediate care of hyper/hypoglycemia, proper foot care and importance of medication, aerobic/resistive exercise and nutrition plan for blood glucose control.;Long Term: Attainment of HbA1C < 7%.    Heart Failure  Yes    Intervention  Provide a combined exercise and nutrition program that is supplemented with education, support and counseling about heart failure. Directed toward relieving symptoms such as shortness of breath, decreased exercise tolerance, and extremity  edema.    Expected Outcomes  Improve functional capacity of life;Short term: Attendance in program 2-3 days a week with increased exercise capacity. Reported lower sodium intake. Reported increased fruit and vegetable intake. Reports medication compliance.;Short term: Daily weights obtained and reported for increase. Utilizing diuretic protocols set by physician.;Long term: Adoption of self-care skills and reduction of barriers for early signs and symptoms recognition and intervention leading to self-care maintenance.    Hypertension  Yes    Intervention  Provide education on lifestyle modifcations including regular physical activity/exercise, weight management, moderate sodium restriction and increased consumption of fresh fruit, vegetables, and low fat dairy, alcohol moderation, and smoking cessation.;Monitor prescription use compliance.    Expected Outcomes  Short Term: Continued assessment and intervention until BP is < 140/52mm HG in hypertensive participants. < 130/8mm HG in hypertensive participants with diabetes, heart failure or chronic kidney disease.;Long Term: Maintenance of blood pressure at goal levels.    Lipids  Yes    Intervention  Provide education and support for participant on nutrition & aerobic/resistive exercise along with prescribed medications to achieve LDL 70mg , HDL >40mg .    Expected Outcomes  Short Term: Participant states understanding of desired cholesterol values and is compliant with medications prescribed. Participant is following exercise prescription and nutrition guidelines.;Long Term: Cholesterol controlled with medications as prescribed, with individualized exercise RX and with personalized nutrition plan. Value goals: LDL < 70mg , HDL > 40 mg.    Stress  Yes    Intervention  Offer individual and/or small group education and counseling on adjustment to heart disease, stress management and health-related lifestyle change. Teach and support self-help strategies.;Refer  participants experiencing significant psychosocial distress to appropriate mental health specialists for further evaluation and treatment. When possible, include family members and significant others in education/counseling sessions.    Expected Outcomes  Short Term: Participant demonstrates changes in  health-related behavior, relaxation and other stress management skills, ability to obtain effective social support, and compliance with psychotropic medications if prescribed.;Long Term: Emotional wellbeing is indicated by absence of clinically significant psychosocial distress or social isolation.       Core Components/Risk Factors/Patient Goals Review:    Core Components/Risk Factors/Patient Goals at Discharge (Final Review):    ITP Comments: ITP Comments    Row Name 12/18/16 0905           ITP Comments  Dr. Fransico Him, Medical Director          Comments: Asako attended orientation from [facility] to [facility]52 to review rules and guidelines for program. Completed 6 minute walk test, Intitial ITP, and exercise prescription.  VSS. Telemetry-Vpaced.  Mrs Shadoan is deconditioned and used a wheelchair for stability. Kurstin stopped 3 times to rest during her walk test. Maryelizabeth's oxygen saturation were 93-94% on room air and uses  oxygen at home. Roselie will use oxygen during exercise at cardiac rehab when she returns next week.Barnet Pall, RN,BSN 12/18/2016 1:14 PM

## 2016-12-19 NOTE — Progress Notes (Signed)
[name] 71 y.o. female DOB: 1945/12/07 MRN: [id number]      Nutrition Note  1. Status post coronary artery stent placement 10/31/16 S/P DES CFX    Past Medical History:  Diagnosis Date  . AICD (automatic cardioverter/defibrillator) present   . Anemia   . Arthritis   . Asthma   . CAD (coronary artery disease)    a. 10/09/16 LHC: SVG->LAD patent, SVG->Diag patent, SVG->RCA patent, SVG->LCx occluded. EF 60%, b. 10/31/16 LHC DES to AV groove Circ, DES to intermed branch  . Cellulitis and abscess of foot 12/2014   RT FOOT  . CHF (congestive heart failure) ([place])   . Chronic bronchitis ([place])   . Chronic lower back pain   . Diverticulosis   . Facial numbness 02/2016  . Fatty liver disease, nonalcoholic   . Gastritis   . GERD (gastroesophageal reflux disease)   . Gout   . H/O hiatal hernia   . High cholesterol   . Hyperplastic colon polyp   . Hypertension   . IBS (irritable bowel syndrome)   . Internal hemorrhoids   . Ischemic cardiomyopathy   . Obesity   . On home oxygen therapy    "2L at night" (10/31/2016)  . OSA (obstructive sleep apnea)    "can't tolerate a mask" (10/30/2016)  . Pneumonia    "couple times" (10/31/2016)  . Shortness of breath   . TIA (transient ischemic attack)    "recently" (10/31/2016)  . Type II diabetes mellitus ([place])    Meds reviewed.   HT: Ht Readings from Last 1 Encounters:  12/18/16 5' 2.25" (1.581 m)    WT: Wt Readings from Last 3 Encounters:  12/18/16 257 lb 8 oz (116.8 kg)  11/13/16 254 lb 14.4 oz (115.6 kg)  11/01/16 235 lb 14.3 oz (107 kg)     BMI 46.7   Current tobacco use? No  Labs:  Lipid Panel     Component Value Date/Time   CHOL 166 10/31/2016 0314   TRIG 181 (H) 10/31/2016 0314   HDL 39 (L) 10/31/2016 0314   CHOLHDL 4.3 10/31/2016 0314   VLDL 36 10/31/2016 0314   LDLCALC 91 10/31/2016 0314    Lab Results  Component Value Date   HGBA1C 6.9 (H) 10/30/2016   CBG (last 3)  No results for input(s): GLUCAP in the  last 72 hours.  Nutrition Note Spoke with pt. Nutrition plan and goals reviewed with pt. Pt wants to lose wt. Pt has been trying to lose wt by "following a no carb diet." Wt loss tips reviewed. Pt is a diet-controlled diabetic at this time. Last A1c indicates blood glucose well-controlled. Barriers to following diet/wt loss include lack of financial resources and pt's ill, homeless son is living with her "at this time." Pt states she receives groceries once a month from "the community." [employer] discussed. Pt expressed understanding of the information reviewed. Pt aware of nutrition education classes offered and plans on attending nutrition classes.  Nutrition Diagnosis ? Food-and nutrition-related knowledge deficit related to lack of exposure to information as related to diagnosis of: ? CVD ? DM ? Obesity related to excessive energy intake as evidenced by a BMI of 46.7  Nutrition Intervention ? Pt's individual nutrition plan and goals reviewed with pt.  Nutrition Goal(s):  ? Pt to identify food quantities necessary to achieve weight loss of 6-24 lb (2.7-10.9 kg) at graduation from cardiac rehab. Goal wt of 233-251 lb desired.   Plan:  Pt to attend  nutrition classes ? Nutrition I ? Nutrition II ? Portion Distortion ? Diabetes Blitz ? Diabetes Q & A Will provide client-centered nutrition education as part of interdisciplinary care.   Monitor and evaluate progress toward nutrition goal with team.  Derek Mound, M.Ed, RD, LDN, CDE 12/19/2016 10:38 AM

## 2016-12-22 ENCOUNTER — Encounter (HOSPITAL_COMMUNITY): Payer: Medicare HMO

## 2016-12-23 ENCOUNTER — Telehealth (HOSPITAL_COMMUNITY): Payer: Self-pay

## 2016-12-23 DIAGNOSIS — R21 Rash and other nonspecific skin eruption: Secondary | ICD-10-CM | POA: Diagnosis not present

## 2016-12-23 NOTE — Telephone Encounter (Signed)
Patient calling CHF clinic to report redness and blisters on bilateral thighs. States her legs and feet are not swollen, and her weight is down 7 lbs over the past few days, and she feels better from CHF perspective. Patient states she had cellulitis after open heart surgery and she states this is similar in presentation. Patient advised to call her PCP to follow up and be seen to further eval for poissble cellulitis, patient states she did and her PCP is not in today, and was advised to call CHF clinic instead. Advised per Dr. Haroldine Laws ok to try taking extra torsemide today (40 mg in addition to her normal 40 mg every am), but to also follow up with urgent care/prime care today. Patient advised to call back if no better by end of the week.  Patient also reminded of upcoming apt with Dr. Haroldine Laws next week. Aware and agreeable.  Renee Pain, RN

## 2016-12-24 ENCOUNTER — Encounter (HOSPITAL_COMMUNITY)
Admission: RE | Admit: 2016-12-24 | Discharge: 2016-12-24 | Disposition: A | Discharge status: Home / Self Care | Payer: Medicare HMO | Source: Ambulatory Visit | Attending: Internal Medicine | Admitting: Internal Medicine

## 2016-12-24 DIAGNOSIS — Z95810 Presence of automatic (implantable) cardiac defibrillator: Secondary | ICD-10-CM | POA: Insufficient documentation

## 2016-12-24 DIAGNOSIS — Z8701 Personal history of pneumonia (recurrent): Secondary | ICD-10-CM | POA: Diagnosis not present

## 2016-12-24 DIAGNOSIS — Z9981 Dependence on supplemental oxygen: Secondary | ICD-10-CM | POA: Diagnosis not present

## 2016-12-24 DIAGNOSIS — Z8673 Personal history of transient ischemic attack (TIA), and cerebral infarction without residual deficits: Secondary | ICD-10-CM | POA: Insufficient documentation

## 2016-12-24 DIAGNOSIS — Z7982 Long term (current) use of aspirin: Secondary | ICD-10-CM | POA: Diagnosis not present

## 2016-12-24 DIAGNOSIS — Z7951 Long term (current) use of inhaled steroids: Secondary | ICD-10-CM | POA: Insufficient documentation

## 2016-12-24 DIAGNOSIS — E119 Type 2 diabetes mellitus without complications: Secondary | ICD-10-CM | POA: Insufficient documentation

## 2016-12-24 DIAGNOSIS — I509 Heart failure, unspecified: Secondary | ICD-10-CM | POA: Diagnosis not present

## 2016-12-24 DIAGNOSIS — Z7902 Long term (current) use of antithrombotics/antiplatelets: Secondary | ICD-10-CM | POA: Insufficient documentation

## 2016-12-24 DIAGNOSIS — J45909 Unspecified asthma, uncomplicated: Secondary | ICD-10-CM | POA: Insufficient documentation

## 2016-12-24 DIAGNOSIS — K219 Gastro-esophageal reflux disease without esophagitis: Secondary | ICD-10-CM | POA: Diagnosis not present

## 2016-12-24 DIAGNOSIS — I110 Hypertensive heart disease with heart failure: Secondary | ICD-10-CM | POA: Insufficient documentation

## 2016-12-24 DIAGNOSIS — K760 Fatty (change of) liver, not elsewhere classified: Secondary | ICD-10-CM | POA: Diagnosis not present

## 2016-12-24 DIAGNOSIS — E669 Obesity, unspecified: Secondary | ICD-10-CM | POA: Diagnosis not present

## 2016-12-24 DIAGNOSIS — M1990 Unspecified osteoarthritis, unspecified site: Secondary | ICD-10-CM | POA: Diagnosis not present

## 2016-12-24 DIAGNOSIS — Z79899 Other long term (current) drug therapy: Secondary | ICD-10-CM | POA: Insufficient documentation

## 2016-12-24 DIAGNOSIS — Z6841 Body Mass Index (BMI) 40.0 and over, adult: Secondary | ICD-10-CM | POA: Diagnosis not present

## 2016-12-24 DIAGNOSIS — E7800 Pure hypercholesterolemia, unspecified: Secondary | ICD-10-CM | POA: Insufficient documentation

## 2016-12-24 DIAGNOSIS — I2510 Atherosclerotic heart disease of native coronary artery without angina pectoris: Secondary | ICD-10-CM | POA: Diagnosis not present

## 2016-12-24 DIAGNOSIS — I255 Ischemic cardiomyopathy: Secondary | ICD-10-CM | POA: Insufficient documentation

## 2016-12-24 DIAGNOSIS — M109 Gout, unspecified: Secondary | ICD-10-CM | POA: Diagnosis not present

## 2016-12-24 DIAGNOSIS — Z87891 Personal history of nicotine dependence: Secondary | ICD-10-CM | POA: Insufficient documentation

## 2016-12-24 DIAGNOSIS — Z955 Presence of coronary angioplasty implant and graft: Secondary | ICD-10-CM | POA: Diagnosis not present

## 2016-12-24 LAB — GLUCOSE, CAPILLARY
Glucose-Capillary: 135 mg/dL — ABNORMAL HIGH (ref 65–99)
Glucose-Capillary: 121 mg/dL — ABNORMAL HIGH (ref 65–99)

## 2016-12-24 NOTE — Progress Notes (Signed)
Daily Session Note  Patient Details  Name: [name] MRN: [id number] Date of Birth: 15-Aug-1945 Referring Provider:     CARDIAC REHAB PHASE II ORIENTATION from 12/18/2016 in [place]  Referring Provider  Croitoru, Dani Gobble MD, and Glori Bickers MD      Encounter Date: 12/24/2016  Check In: Session Check In - 12/24/16 1216      Check-In   Location  [facility]    Staff Present  Maurice Small, RN, BSN;Amber Fair, MS, ACSM RCEP, Exercise Physiologist;Joann Rion, RN, Marga Melnick, RN, BSN    Supervising physician immediately available to respond to emergencies  [facility] immediately available    Physician(s)  Dr. Broadus John    Medication changes reported      No    Fall or balance concerns reported     No    Tobacco Cessation  No Change    Warm-up and Cool-down  Performed as group-led instruction    Resistance Training Performed  No    VAD Patient?  No      Pain Assessment   Currently in Pain?  No/denies       Capillary Blood Glucose: Results for orders placed or performed during the hospital encounter of 12/24/16 (from the past 24 hour(s))  Glucose, capillary     Status: Abnormal   Collection Time: 12/24/16 11:44 AM  Result Value Ref Range   Glucose-Capillary 135 (H) 65 - 99 mg/dL  Glucose, capillary     Status: Abnormal   Collection Time: 12/24/16 12:30 PM  Result Value Ref Range   Glucose-Capillary 121 (H) 65 - 99 mg/dL      Social History   Tobacco Use  Smoking Status Former Smoker  . Packs/day: 0.25  . Years: 3.00  . Pack years: 0.75  . Types: Cigarettes  . Last attempt to quit: 02/11/1968  . Years since quitting: 48.9  Smokeless Tobacco Never Used    Goals Met:  No report of cardiac concerns or symptoms  Goals Unmet:  Not Applicable  Comments: [name] started cardiac rehab today.  Pt tolerated light exercise without difficulty. VSS, telemetry-Vpaced, asymptomatic.  Medication list  reconciled. Pt denies barriers to medicaiton compliance.  PSYCHOSOCIAL ASSESSMENT:  PHQ-0. Pt exhibits positive coping skills, hopeful outlook with supportive family. No psychosocial needs identified at this time, no psychosocial interventions necessary.    Pt enjoys watching TV and playing with her grandkids.   Pt oriented to exercise equipment and routine.    Understanding verbalized. [name] exercise on 2l/min of oxygen today. [name]'s oxygen saturations remained at 93% or greater today.Will continue to monitor the patient throughout  the program.Alicia Venetia Maxon, RN,BSN 12/24/2016 4:24 PM    Dr. Fransico Him is Medical Director for Cardiac Rehab at [facility].

## 2016-12-26 ENCOUNTER — Encounter (HOSPITAL_COMMUNITY)
Admission: RE | Admit: 2016-12-26 | Discharge: 2016-12-26 | Disposition: A | Discharge status: Home / Self Care | Payer: Medicare HMO | Source: Ambulatory Visit | Attending: Cardiovascular Disease | Admitting: Cardiovascular Disease

## 2016-12-26 DIAGNOSIS — Z7982 Long term (current) use of aspirin: Secondary | ICD-10-CM | POA: Diagnosis not present

## 2016-12-26 DIAGNOSIS — I10 Essential (primary) hypertension: Secondary | ICD-10-CM | POA: Diagnosis not present

## 2016-12-26 DIAGNOSIS — J4520 Mild intermittent asthma, uncomplicated: Secondary | ICD-10-CM | POA: Diagnosis not present

## 2016-12-26 DIAGNOSIS — Z95810 Presence of automatic (implantable) cardiac defibrillator: Secondary | ICD-10-CM | POA: Diagnosis not present

## 2016-12-26 DIAGNOSIS — Z9981 Dependence on supplemental oxygen: Secondary | ICD-10-CM | POA: Diagnosis not present

## 2016-12-26 DIAGNOSIS — Z79899 Other long term (current) drug therapy: Secondary | ICD-10-CM | POA: Diagnosis not present

## 2016-12-26 DIAGNOSIS — Z7951 Long term (current) use of inhaled steroids: Secondary | ICD-10-CM | POA: Diagnosis not present

## 2016-12-26 DIAGNOSIS — R0602 Shortness of breath: Secondary | ICD-10-CM | POA: Diagnosis not present

## 2016-12-26 DIAGNOSIS — R269 Unspecified abnormalities of gait and mobility: Secondary | ICD-10-CM | POA: Diagnosis not present

## 2016-12-26 DIAGNOSIS — J9611 Chronic respiratory failure with hypoxia: Secondary | ICD-10-CM | POA: Diagnosis not present

## 2016-12-26 DIAGNOSIS — I5032 Chronic diastolic (congestive) heart failure: Secondary | ICD-10-CM | POA: Diagnosis not present

## 2016-12-26 DIAGNOSIS — Z955 Presence of coronary angioplasty implant and graft: Secondary | ICD-10-CM

## 2016-12-26 DIAGNOSIS — Z7902 Long term (current) use of antithrombotics/antiplatelets: Secondary | ICD-10-CM | POA: Diagnosis not present

## 2016-12-26 DIAGNOSIS — I2510 Atherosclerotic heart disease of native coronary artery without angina pectoris: Secondary | ICD-10-CM | POA: Diagnosis not present

## 2016-12-26 DIAGNOSIS — M1990 Unspecified osteoarthritis, unspecified site: Secondary | ICD-10-CM | POA: Diagnosis not present

## 2016-12-29 ENCOUNTER — Encounter (HOSPITAL_COMMUNITY)
Admission: RE | Admit: 2016-12-29 | Discharge: 2016-12-29 | Disposition: A | Discharge status: Home / Self Care | Payer: Medicare HMO | Source: Ambulatory Visit | Attending: Cardiovascular Disease | Admitting: Cardiovascular Disease

## 2016-12-29 DIAGNOSIS — Z955 Presence of coronary angioplasty implant and graft: Secondary | ICD-10-CM

## 2016-12-29 DIAGNOSIS — Z9981 Dependence on supplemental oxygen: Secondary | ICD-10-CM | POA: Diagnosis not present

## 2016-12-29 DIAGNOSIS — M1990 Unspecified osteoarthritis, unspecified site: Secondary | ICD-10-CM | POA: Diagnosis not present

## 2016-12-29 DIAGNOSIS — Z79899 Other long term (current) drug therapy: Secondary | ICD-10-CM | POA: Diagnosis not present

## 2016-12-29 DIAGNOSIS — Z7951 Long term (current) use of inhaled steroids: Secondary | ICD-10-CM | POA: Diagnosis not present

## 2016-12-29 DIAGNOSIS — Z95810 Presence of automatic (implantable) cardiac defibrillator: Secondary | ICD-10-CM | POA: Diagnosis not present

## 2016-12-29 DIAGNOSIS — I2510 Atherosclerotic heart disease of native coronary artery without angina pectoris: Secondary | ICD-10-CM | POA: Diagnosis not present

## 2016-12-29 DIAGNOSIS — Z7902 Long term (current) use of antithrombotics/antiplatelets: Secondary | ICD-10-CM | POA: Diagnosis not present

## 2016-12-29 DIAGNOSIS — Z7982 Long term (current) use of aspirin: Secondary | ICD-10-CM | POA: Diagnosis not present

## 2016-12-29 LAB — GLUCOSE, CAPILLARY
Glucose-Capillary: 110 mg/dL — ABNORMAL HIGH (ref 65–99)
Glucose-Capillary: 128 mg/dL — ABNORMAL HIGH (ref 65–99)

## 2016-12-30 ENCOUNTER — Other Ambulatory Visit: Payer: Self-pay

## 2016-12-30 ENCOUNTER — Ambulatory Visit (HOSPITAL_COMMUNITY)
Admission: RE | Admit: 2016-12-30 | Discharge: 2016-12-30 | Disposition: A | Discharge status: Home / Self Care | Payer: Medicare HMO | Source: Ambulatory Visit | Attending: Internal Medicine | Admitting: Internal Medicine

## 2016-12-30 VITALS — BP 138/84 | HR 65 | Wt 254.8 lb

## 2016-12-30 DIAGNOSIS — K760 Fatty (change of) liver, not elsewhere classified: Secondary | ICD-10-CM | POA: Insufficient documentation

## 2016-12-30 DIAGNOSIS — L03115 Cellulitis of right lower limb: Secondary | ICD-10-CM | POA: Insufficient documentation

## 2016-12-30 DIAGNOSIS — Z8673 Personal history of transient ischemic attack (TIA), and cerebral infarction without residual deficits: Secondary | ICD-10-CM | POA: Insufficient documentation

## 2016-12-30 DIAGNOSIS — J449 Chronic obstructive pulmonary disease, unspecified: Secondary | ICD-10-CM | POA: Insufficient documentation

## 2016-12-30 DIAGNOSIS — M109 Gout, unspecified: Secondary | ICD-10-CM | POA: Insufficient documentation

## 2016-12-30 DIAGNOSIS — Z951 Presence of aortocoronary bypass graft: Secondary | ICD-10-CM | POA: Insufficient documentation

## 2016-12-30 DIAGNOSIS — E7800 Pure hypercholesterolemia, unspecified: Secondary | ICD-10-CM | POA: Insufficient documentation

## 2016-12-30 DIAGNOSIS — Z91041 Radiographic dye allergy status: Secondary | ICD-10-CM | POA: Insufficient documentation

## 2016-12-30 DIAGNOSIS — Z882 Allergy status to sulfonamides status: Secondary | ICD-10-CM | POA: Insufficient documentation

## 2016-12-30 DIAGNOSIS — K648 Other hemorrhoids: Secondary | ICD-10-CM | POA: Insufficient documentation

## 2016-12-30 DIAGNOSIS — K219 Gastro-esophageal reflux disease without esophagitis: Secondary | ICD-10-CM | POA: Insufficient documentation

## 2016-12-30 DIAGNOSIS — G4733 Obstructive sleep apnea (adult) (pediatric): Secondary | ICD-10-CM | POA: Insufficient documentation

## 2016-12-30 DIAGNOSIS — I130 Hypertensive heart and chronic kidney disease with heart failure and stage 1 through stage 4 chronic kidney disease, or unspecified chronic kidney disease: Secondary | ICD-10-CM | POA: Diagnosis not present

## 2016-12-30 DIAGNOSIS — Z91013 Allergy to seafood: Secondary | ICD-10-CM | POA: Insufficient documentation

## 2016-12-30 DIAGNOSIS — Z8249 Family history of ischemic heart disease and other diseases of the circulatory system: Secondary | ICD-10-CM | POA: Insufficient documentation

## 2016-12-30 DIAGNOSIS — Z803 Family history of malignant neoplasm of breast: Secondary | ICD-10-CM | POA: Insufficient documentation

## 2016-12-30 DIAGNOSIS — E1122 Type 2 diabetes mellitus with diabetic chronic kidney disease: Secondary | ICD-10-CM | POA: Diagnosis not present

## 2016-12-30 DIAGNOSIS — Z95810 Presence of automatic (implantable) cardiac defibrillator: Secondary | ICD-10-CM | POA: Insufficient documentation

## 2016-12-30 DIAGNOSIS — K449 Diaphragmatic hernia without obstruction or gangrene: Secondary | ICD-10-CM | POA: Insufficient documentation

## 2016-12-30 DIAGNOSIS — M1990 Unspecified osteoarthritis, unspecified site: Secondary | ICD-10-CM | POA: Insufficient documentation

## 2016-12-30 DIAGNOSIS — Z833 Family history of diabetes mellitus: Secondary | ICD-10-CM | POA: Insufficient documentation

## 2016-12-30 DIAGNOSIS — Z888 Allergy status to other drugs, medicaments and biological substances status: Secondary | ICD-10-CM | POA: Insufficient documentation

## 2016-12-30 DIAGNOSIS — I255 Ischemic cardiomyopathy: Secondary | ICD-10-CM | POA: Insufficient documentation

## 2016-12-30 DIAGNOSIS — Z7982 Long term (current) use of aspirin: Secondary | ICD-10-CM | POA: Insufficient documentation

## 2016-12-30 DIAGNOSIS — Z825 Family history of asthma and other chronic lower respiratory diseases: Secondary | ICD-10-CM | POA: Insufficient documentation

## 2016-12-30 DIAGNOSIS — Z79899 Other long term (current) drug therapy: Secondary | ICD-10-CM | POA: Insufficient documentation

## 2016-12-30 DIAGNOSIS — K589 Irritable bowel syndrome without diarrhea: Secondary | ICD-10-CM | POA: Insufficient documentation

## 2016-12-30 DIAGNOSIS — I5032 Chronic diastolic (congestive) heart failure: Secondary | ICD-10-CM | POA: Diagnosis not present

## 2016-12-30 DIAGNOSIS — J9610 Chronic respiratory failure, unspecified whether with hypoxia or hypercapnia: Secondary | ICD-10-CM | POA: Insufficient documentation

## 2016-12-30 DIAGNOSIS — Z9981 Dependence on supplemental oxygen: Secondary | ICD-10-CM | POA: Diagnosis not present

## 2016-12-30 DIAGNOSIS — I2510 Atherosclerotic heart disease of native coronary artery without angina pectoris: Secondary | ICD-10-CM | POA: Diagnosis not present

## 2016-12-30 DIAGNOSIS — N183 Chronic kidney disease, stage 3 (moderate): Secondary | ICD-10-CM | POA: Diagnosis not present

## 2016-12-30 DIAGNOSIS — Z87891 Personal history of nicotine dependence: Secondary | ICD-10-CM | POA: Insufficient documentation

## 2016-12-30 NOTE — Patient Instructions (Signed)
We will contact you in 6 months to schedule your next appointment.  

## 2016-12-30 NOTE — Addendum Note (Signed)
Encounter addended by: Scarlette Calico, RN on: 12/30/2016 12:11 PM  Actions taken: Sign clinical note

## 2016-12-30 NOTE — Progress Notes (Signed)
ADVANCED HF CLINIC NOTE  Patient ID: [name], female   DOB: [birth year], 71 y.o.   MRN: [id number]  Primary Cardiologist: [name] Primary HF: [name]   HPI: [name] is a 71 year old with history of morbid obesity, DM, HTN, asthma, OSA, ICD, GERD, arthritis, CRT-D 2008 [employer], CAD, S/P CABG x5 11/29/2014.  Discharged from [facility] 12/07/14 s/p CABG. Discharge weight 252.  Went to [facility].  Readmitted 12/21/14 with RLE cellulitis and volume overload with weight of 273 and complaints of dyspnea and orthopnea. RLE cellulitis treated with IV ABX. She was had brisk diuresis with lasix gtt. Diuresis slowed down with slight Cr bump. Acetazolamide added and monitored closely with history of sulfa allergy. Diuresed well, but lasix held with continued Cr bump. Transitioned to po torsemide for d/c. Discharge weight 246 lbs.  Admitted 1/23 through 03/09/16 with CP. CEs negative. She did not require a heart craterization.   [name]. Seen recently by [name]. Was complaining of persistent, severe SOB. Volume status felt not to be elevated. Felt to be due to obesity and OHS. Carvedilol switched to bisoprolol. Also seen by [name] who felt symptoms out of proportion to objective findings.   Here for f/u. Since last visit underwent cath as below. Found to have lost SVG to LCX territory. Underwent to stent to LCX and Ramus. RHC looked good. Now feeling better.Breathing easier. No CP. Going to CR. Watching her diet. SBP 110-120 range at CR. No edema, orthopnea or PND. No problems with medicines.    Cath 10/09/16 1. Severe 3v CAD 2. LIMA  to LAD widely patent 3. SVG to Diagonal widely patent 4. SVG to RCA widely patent 5. SVG to LCx and OM occluded ostially 6. LVEF 60% 7. Mild PAH with normal PCWP and output  Underwent PCI 10/31/16:  -Successful PTCA/DES x 3 AV groove circumflex -Successful PTCA/DES x 1 intermediate branch     Findings:  Ao = 117/61 (83) LV = 126/8 RA = 11 RV = 39/10 PA = 37/16 (25) PCW = 13 Fick cardiac output/index = 5.1/2.3 PVR = 2.4 WU FA sat = 95% PA sat = 65%  Assessment: 1. Severe 3v CAD 2. LIMA  to LAD widely patent 3. SVG to Diagonal widely patent 4. SVG to RCA widely patent 5. SVG to LCx and OM occluded ostially 6. LVEF 60% 7. Mild PAH with normal PCWP and output    Echo 11/24/14 EF 55-60%, Grade 1 DD. Mild TR TEE 11/29/14 EF 55-60%, LVH, Mild AR and MR Echo 9/17: EF 55-60% LVH  Labs 02/21/2015: K 4.5 Creatinine 1.39  Labs 07/12/2015: K 4.4 Creatinine 1.14 Hgb 11.5   Past Medical History:  Diagnosis Date  . AICD (automatic cardioverter/defibrillator) present   . Anemia   . Arthritis   . Asthma   . CAD (coronary artery disease)    a. 10/09/16 LHC: SVG->LAD patent, SVG->Diag patent, SVG->RCA patent, SVG->LCx occluded. EF 60%, b. 10/31/16 LHC DES to AV groove Circ, DES to intermed branch  . Cellulitis and abscess of foot 12/2014   RT FOOT  . CHF (congestive heart failure) ([place])   . Chronic bronchitis ([place])   . Chronic lower back pain   . Diverticulosis   . Facial numbness 02/2016  . Fatty liver disease, nonalcoholic   . Gastritis   . GERD (gastroesophageal reflux disease)   . Gout   . H/O hiatal hernia   . High  cholesterol   . Hyperplastic colon polyp   . Hypertension   . IBS (irritable bowel syndrome)   . Internal hemorrhoids   . Ischemic cardiomyopathy   . Obesity   . On home oxygen therapy    "2L at night" (10/31/2016)  . OSA (obstructive sleep apnea)    "can't tolerate a mask" (10/30/2016)  . Pneumonia    "couple times" (10/31/2016)  . Shortness of breath   . TIA (transient ischemic attack)    "recently" (10/31/2016)  . Type II diabetes mellitus ([place])     Current Outpatient Medications  Medication Sig Dispense Refill  . acetaminophen (TYLENOL) 500 MG tablet Take 1,000 mg by mouth every 6 (six) hours as needed (for pain.).    [name] albuterol  (PROVENTIL) (2.5 MG/3ML) 0.083% nebulizer solution Take 2.5 mg by nebulization every 6 (six) hours as needed for wheezing or shortness of breath.    [name] aspirin EC 81 MG tablet Take 1 tablet (81 mg total) by mouth daily. 90 tablet 3  . bisoprolol (ZEBETA) 5 MG tablet Take 1 tablet (5 mg total) by mouth daily. 90 tablet 3  . budesonide-formoterol (SYMBICORT) 160-4.5 MCG/ACT inhaler Inhale 2 puffs into the lungs 2 (two) times daily.    . cetirizine (ZYRTEC) 10 MG tablet Take 10 mg by mouth daily.     . clopidogrel (PLAVIX) 75 MG tablet Take 1 tablet (75 mg total) by mouth daily. (Patient taking differently: Take 75 mg by mouth every evening. ) 90 tablet 3  . colchicine 0.6 MG tablet Take one tablet two times daily as needed. (Patient taking differently: Take 0.6-1.2 mg by mouth daily as needed (for gout flares ups). Take one tablet two times daily as needed.) 30 tablet 0  . docusate sodium (COLACE) 100 MG capsule Take 100-200 mg by mouth every evening.     [name] EPINEPHrine 0.3 mg/0.3 mL IJ SOAJ injection Inject 0.3 mg into the muscle daily as needed (anaphylatic allergic reaction).     . fluticasone (FLONASE) 50 MCG/ACT nasal spray Place 1 spray into both nostrils daily as needed for rhinitis or allergies.     . hydroxypropyl methylcellulose / hypromellose (ISOPTO TEARS / GONIOVISC) 2.5 % ophthalmic solution Place 1 drop into both eyes 4 (four) times daily as needed for dry eyes.     [name] ipratropium (ATROVENT) 0.06 % nasal spray 1-2 pffs [name] to 4 x daily as needed (Patient taking differently: Place 2 sprays into both nostrils 4 (four) times daily as needed. 1-2 pffs [name] to 4 x daily as needed) 15 mL 12  . isosorbide mononitrate (IMDUR) 30 MG 24 hr tablet TAKE 1 TABLET EVERY DAY 90 tablet 1  . metolazone (ZAROXOLYN) 5 MG tablet Take 1 tablet (5 mg total) by mouth as needed. Take only as directed by Heart Failure Clinic (Patient taking differently: Take 5 mg by mouth daily. ) 5 tablet 0  . Multiple Vitamin  (MULTIVITAMIN WITH MINERALS) TABS tablet Take 1 tablet by mouth daily.    . nitroGLYCERIN (NITROSTAT) 0.4 MG SL tablet Place 1 tablet (0.4 mg total) under the tongue every 5 (five) minutes as needed for chest pain. 25 tablet 3  . OXYGEN Inhale 2 L into the lungs See admin instructions. During waking hours as needed for low oxygen levels & at bedtime each night.    . pantoprazole (PROTONIX) 40 MG tablet Take 1 tablet (40 mg total) by mouth daily. 90 tablet 3  . potassium chloride SA (K-DUR,KLOR-CON) 20 MEQ tablet  Take 20 mEq by mouth daily.     . rosuvastatin (CRESTOR) 20 MG tablet TAKE 1 TABLET EVERY DAY 90 tablet 3  . spironolactone (ALDACTONE) 25 MG tablet TAKE 1 TABLET EVERY DAY 90 tablet 3  . torsemide (DEMADEX) 20 MG tablet Take 2 tablets (40 mg total) by mouth daily. 180 tablet 3  . triamcinolone cream (KENALOG) 0.1 % Apply 1 application topically daily as needed (applied to affected itchy area(s)).     . VENTOLIN HFA 108 (90 Base) MCG/ACT inhaler INHALE 2 PUFFS EVERY 4 HOURS AS NEEDED FOR WHEEZING 18 g 3   No current facility-administered medications for this encounter.     Allergies  Allergen Reactions  . Ivp Dye [Iodinated Diagnostic Agents] Shortness Of Breath    No reaction to PO contrast with non-ionic dye.06-25-2014/rsm  . Shellfish-Derived Products Anaphylaxis  . Sulfa Antibiotics Shortness Of Breath  . Uloric [Febuxostat] Nausea And Vomiting  . Iodine Hives  . Atorvastatin Other (See Comments)    Pt states "causes bilateral leg pain/cramps."  . Cephalexin Itching and Rash    Rash & itch after Keflex  . Zithromax [Azithromycin] Rash      Social History   Socioeconomic History  . Marital status: Widowed    Spouse name: Not on file  . Number of children: 5  . Years of education: Not on file  . Highest education level: Not on file  Social Needs  . Financial resource strain: Very hard  . Food insecurity - worry: Often true  . Food insecurity - inability: Often true    . Transportation needs - medical: Not on file  . Transportation needs - non-medical: Not on file  Occupational History  . Occupation: STAFF/BUFFET    Employer: [employer]  Tobacco Use  . Smoking status: Former Smoker    Packs/day: 0.25    Years: 3.00    Pack years: 0.75    Types: Cigarettes    Last attempt to quit: 02/11/1968    Years since quitting: 48.9  . Smokeless tobacco: Never Used  Substance and Sexual Activity  . Alcohol use: No  . Drug use: No  . Sexual activity: No  Other Topics Concern  . Not on file  Social History Narrative   Widowed last year. She lives with her two sons.      Family History  Problem Relation Age of Onset  . Breast cancer Mother   . Diabetes Mother   . Heart disease Maternal Grandmother   . Kidney disease Maternal Grandmother   . Diabetes Maternal Grandmother   . Glaucoma Maternal Aunt   . Heart disease Maternal Aunt   . Asthma Sister     Vitals:   12/30/16 1142  BP: 138/84  Pulse: 65  SpO2: 93%  Weight: 254 lb 12.8 oz (115.6 kg)   Wt Readings from Last 3 Encounters:  12/30/16 254 lb 12.8 oz (115.6 kg)  12/18/16 257 lb 8 oz (116.8 kg)  11/13/16 254 lb 14.4 oz (115.6 kg)    Chest Circumfrence 144 cm.   PHYSICAL EXAM: General:  Obese woman sitting on exam table No resp difficulty HEENT: normal Neck: supple. no JVD. Carotids 2+ bilat; no bruits. No lymphadenopathy or thryomegaly appreciated. Cor: PMI nondisplaced. Regular rate & rhythm. No rubs, gallops or murmurs. Lungs: clear Abdomen: obese soft, nontender, nondistended. No hepatosplenomegaly. No bruits or masses. Good bowel sounds. Extremities: no cyanosis, clubbing, rash, trace edema + varicose veins Neuro: alert & orientedx3, cranial nerves grossly  intact. moves all 4 extremities w/o difficulty. Affect pleasant  ASSESSMENT & PLAN: 1. Chronic diastolic HF - Echo 6/95 EF 55-60%, mild LVH.  - RHC in 8/18 looked good. - Symptoms much improved after PCI -  Continue current meds.  - Reinforced fluid restriction to < 2 L daily, sodium restriction to less than 2000 mg daily, and the importance of daily weights.   - Can consider cardiomems as needed - Continue attempts at weight loss  2. CAD s/p CABG 11/2014 - Cath as above. Now s/p recent stents. Continue CR.  - Continue statin, bb, aspirin.  3. Morbid obesity - Continue weight loss efforts. Not candidate for Bariatric surgery until DAPT complete 4. H/o heart block s/p BiV pacer/CRT-D [employer] - with malfunction of A lead and chronic penetration of V lead into pericardium.  - Stable, no change. Followed by [name]  5. Chronic renal failure stage III - BMET today 6. OSA:  - She cannot tolerate CPAP. 7. Chronic respiratory failure - We did hall walk in clinic and sats dropped to 87% at last visit . Increased back to 96% with 2L O2.  - Uses O2 at night and with exercise - PFTs pending with Pulmonary.   Glori Bickers, MD   11:57 AM

## 2016-12-31 ENCOUNTER — Telehealth (HOSPITAL_COMMUNITY): Payer: Self-pay | Admitting: Family Medicine

## 2016-12-31 ENCOUNTER — Encounter (HOSPITAL_COMMUNITY): Payer: Medicare HMO

## 2017-01-01 DIAGNOSIS — G4733 Obstructive sleep apnea (adult) (pediatric): Secondary | ICD-10-CM | POA: Diagnosis not present

## 2017-01-01 DIAGNOSIS — J4530 Mild persistent asthma, uncomplicated: Secondary | ICD-10-CM | POA: Diagnosis not present

## 2017-01-05 ENCOUNTER — Encounter (HOSPITAL_COMMUNITY)
Admission: RE | Admit: 2017-01-05 | Discharge: 2017-01-05 | Disposition: A | Discharge status: Home / Self Care | Payer: Medicare HMO | Source: Ambulatory Visit | Attending: Cardiovascular Disease | Admitting: Cardiovascular Disease

## 2017-01-05 DIAGNOSIS — Z7902 Long term (current) use of antithrombotics/antiplatelets: Secondary | ICD-10-CM | POA: Diagnosis not present

## 2017-01-05 DIAGNOSIS — Z955 Presence of coronary angioplasty implant and graft: Secondary | ICD-10-CM

## 2017-01-05 DIAGNOSIS — M1990 Unspecified osteoarthritis, unspecified site: Secondary | ICD-10-CM | POA: Diagnosis not present

## 2017-01-05 DIAGNOSIS — Z7982 Long term (current) use of aspirin: Secondary | ICD-10-CM | POA: Diagnosis not present

## 2017-01-05 DIAGNOSIS — Z79899 Other long term (current) drug therapy: Secondary | ICD-10-CM | POA: Diagnosis not present

## 2017-01-05 DIAGNOSIS — I2510 Atherosclerotic heart disease of native coronary artery without angina pectoris: Secondary | ICD-10-CM | POA: Diagnosis not present

## 2017-01-05 DIAGNOSIS — Z9981 Dependence on supplemental oxygen: Secondary | ICD-10-CM | POA: Diagnosis not present

## 2017-01-05 DIAGNOSIS — Z95810 Presence of automatic (implantable) cardiac defibrillator: Secondary | ICD-10-CM | POA: Diagnosis not present

## 2017-01-05 DIAGNOSIS — Z7951 Long term (current) use of inhaled steroids: Secondary | ICD-10-CM | POA: Diagnosis not present

## 2017-01-05 LAB — GLUCOSE, CAPILLARY: Glucose-Capillary: 108 mg/dL — ABNORMAL HIGH (ref 65–99)

## 2017-01-07 ENCOUNTER — Encounter (HOSPITAL_COMMUNITY)
Admission: RE | Admit: 2017-01-07 | Discharge: 2017-01-07 | Disposition: A | Discharge status: Home / Self Care | Payer: Medicare HMO | Source: Ambulatory Visit | Attending: Cardiovascular Disease | Admitting: Cardiovascular Disease

## 2017-01-07 DIAGNOSIS — Z955 Presence of coronary angioplasty implant and graft: Secondary | ICD-10-CM

## 2017-01-07 DIAGNOSIS — Z95810 Presence of automatic (implantable) cardiac defibrillator: Secondary | ICD-10-CM | POA: Diagnosis not present

## 2017-01-07 DIAGNOSIS — Z9981 Dependence on supplemental oxygen: Secondary | ICD-10-CM | POA: Diagnosis not present

## 2017-01-07 DIAGNOSIS — I2510 Atherosclerotic heart disease of native coronary artery without angina pectoris: Secondary | ICD-10-CM | POA: Diagnosis not present

## 2017-01-07 DIAGNOSIS — Z79899 Other long term (current) drug therapy: Secondary | ICD-10-CM | POA: Diagnosis not present

## 2017-01-07 DIAGNOSIS — Z7951 Long term (current) use of inhaled steroids: Secondary | ICD-10-CM | POA: Diagnosis not present

## 2017-01-07 DIAGNOSIS — Z7902 Long term (current) use of antithrombotics/antiplatelets: Secondary | ICD-10-CM | POA: Diagnosis not present

## 2017-01-07 DIAGNOSIS — M1990 Unspecified osteoarthritis, unspecified site: Secondary | ICD-10-CM | POA: Diagnosis not present

## 2017-01-07 DIAGNOSIS — Z7982 Long term (current) use of aspirin: Secondary | ICD-10-CM | POA: Diagnosis not present

## 2017-01-07 LAB — GLUCOSE, CAPILLARY: Glucose-Capillary: 116 mg/dL — ABNORMAL HIGH (ref 65–99)

## 2017-01-09 ENCOUNTER — Encounter (HOSPITAL_COMMUNITY): Payer: Medicare HMO

## 2017-01-09 NOTE — Progress Notes (Signed)
Cardiac Individual Treatment Plan  Patient Details  Name: [name] MRN: [id number] Date of Birth: January 24, 1946 Referring Provider:     CARDIAC REHAB PHASE II ORIENTATION from 12/18/2016 in [place]  Referring Provider  Croitoru, Dani Gobble MD, and Glori Bickers MD      Initial Encounter Date:    CARDIAC REHAB PHASE II ORIENTATION from 12/18/2016 in [place]  Date  12/18/16  Referring Provider  Croitoru, [name] MD, and Glori Bickers MD      Visit Diagnosis: Status post coronary artery stent placement 10/31/16 S/P DES CFX  Patient's Home Medications on Admission:  Current Outpatient Medications:  .  acetaminophen (TYLENOL) 500 MG tablet, Take 1,000 mg by mouth every 6 (six) hours as needed (for pain.)., Disp: , Rfl:  .  albuterol (PROVENTIL) (2.5 MG/3ML) 0.083% nebulizer solution, Take 2.5 mg by nebulization every 6 (six) hours as needed for wheezing or shortness of breath., Disp: , Rfl:  .  aspirin EC 81 MG tablet, Take 1 tablet (81 mg total) by mouth daily., Disp: 90 tablet, Rfl: 3 .  bisoprolol (ZEBETA) 5 MG tablet, Take 1 tablet (5 mg total) by mouth daily., Disp: 90 tablet, Rfl: 3 .  budesonide-formoterol (SYMBICORT) 160-4.5 MCG/ACT inhaler, Inhale 2 puffs into the lungs 2 (two) times daily., Disp: , Rfl:  .  cetirizine (ZYRTEC) 10 MG tablet, Take 10 mg by mouth daily. , Disp: , Rfl:  .  clopidogrel (PLAVIX) 75 MG tablet, Take 1 tablet (75 mg total) by mouth daily. (Patient taking differently: Take 75 mg by mouth every evening. ), Disp: 90 tablet, Rfl: 3 .  colchicine 0.6 MG tablet, Take one tablet two times daily as needed. (Patient taking differently: Take 0.6-1.2 mg by mouth daily as needed (for gout flares ups). Take one tablet two times daily as needed.), Disp: 30 tablet, Rfl: 0 .  docusate sodium (COLACE) 100 MG capsule, Take 100-200 mg by mouth every evening. , Disp: , Rfl:  .  EPINEPHrine 0.3 mg/0.3 mL  IJ SOAJ injection, Inject 0.3 mg into the muscle daily as needed (anaphylatic allergic reaction). , Disp: , Rfl:  .  fluticasone (FLONASE) 50 MCG/ACT nasal spray, Place 1 spray into both nostrils daily as needed for rhinitis or allergies. , Disp: , Rfl:  .  hydroxypropyl methylcellulose / hypromellose (ISOPTO TEARS / GONIOVISC) 2.5 % ophthalmic solution, Place 1 drop into both eyes 4 (four) times daily as needed for dry eyes. , Disp: , Rfl:  .  ipratropium (ATROVENT) 0.06 % nasal spray, 1-2 pffs up to 4 x daily as needed (Patient taking differently: Place 2 sprays into both nostrils 4 (four) times daily as needed. 1-2 pffs up to 4 x daily as needed), Disp: 15 mL, Rfl: 12 .  isosorbide mononitrate (IMDUR) 30 MG 24 hr tablet, TAKE 1 TABLET EVERY DAY, Disp: 90 tablet, Rfl: 1 .  metolazone (ZAROXOLYN) 5 MG tablet, Take 1 tablet (5 mg total) by mouth as needed. Take only as directed by [facility] (Patient taking differently: Take 5 mg by mouth daily. ), Disp: 5 tablet, Rfl: 0 .  Multiple Vitamin (MULTIVITAMIN WITH MINERALS) TABS tablet, Take 1 tablet by mouth daily., Disp: , Rfl:  .  nitroGLYCERIN (NITROSTAT) 0.4 MG SL tablet, Place 1 tablet (0.4 mg total) under the tongue every 5 (five) minutes as needed for chest pain., Disp: 25 tablet, Rfl: 3 .  OXYGEN, Inhale 2 L into the lungs See admin  instructions. During waking hours as needed for low oxygen levels & at bedtime each night., Disp: , Rfl:  .  pantoprazole (PROTONIX) 40 MG tablet, Take 1 tablet (40 mg total) by mouth daily., Disp: 90 tablet, Rfl: 3 .  potassium chloride SA (K-DUR,KLOR-CON) 20 MEQ tablet, Take 20 mEq by mouth daily. , Disp: , Rfl:  .  rosuvastatin (CRESTOR) 20 MG tablet, TAKE 1 TABLET EVERY DAY, Disp: 90 tablet, Rfl: 3 .  spironolactone (ALDACTONE) 25 MG tablet, TAKE 1 TABLET EVERY DAY, Disp: 90 tablet, Rfl: 3 .  torsemide (DEMADEX) 20 MG tablet, Take 2 tablets (40 mg total) by mouth daily., Disp: 180 tablet, Rfl: 3 .   triamcinolone cream (KENALOG) 0.1 %, Apply 1 application topically daily as needed (applied to affected itchy area(s)). , Disp: , Rfl:  .  VENTOLIN HFA 108 (90 Base) MCG/ACT inhaler, INHALE 2 PUFFS EVERY 4 HOURS AS NEEDED FOR WHEEZING, Disp: 18 g, Rfl: 3  Past Medical History: Past Medical History:  Diagnosis Date  . AICD (automatic cardioverter/defibrillator) present   . Anemia   . Arthritis   . Asthma   . CAD (coronary artery disease)    a. 10/09/16 LHC: SVG->LAD patent, SVG->Diag patent, SVG->RCA patent, SVG->LCx occluded. EF 60%, b. 10/31/16 LHC DES to AV groove Circ, DES to intermed branch  . Cellulitis and abscess of foot 12/2014   RT FOOT  . CHF (congestive heart failure) ([place])   . Chronic bronchitis ([place])   . Chronic lower back pain   . Diverticulosis   . Facial numbness 02/2016  . Fatty liver disease, nonalcoholic   . Gastritis   . GERD (gastroesophageal reflux disease)   . Gout   . H/O hiatal hernia   . High cholesterol   . Hyperplastic colon polyp   . Hypertension   . IBS (irritable bowel syndrome)   . Internal hemorrhoids   . Ischemic cardiomyopathy   . Obesity   . On home oxygen therapy    "2L at night" (10/31/2016)  . OSA (obstructive sleep apnea)    "can't tolerate a mask" (10/30/2016)  . Pneumonia    "couple times" (10/31/2016)  . Shortness of breath   . TIA (transient ischemic attack)    "recently" (10/31/2016)  . Type II diabetes mellitus ([facility])     Tobacco Use: Social History   Tobacco Use  Smoking Status Former Smoker  . Packs/day: 0.25  . Years: 3.00  . Pack years: 0.75  . Types: Cigarettes  . Last attempt to quit: 02/11/1968  . Years since quitting: 48.9  Smokeless Tobacco Never Used    Labs: Recent Chemical engineer    Labs for ITP Cardiac and Pulmonary Rehab Latest Ref Rng & Units 03/04/2016 10/09/2016 10/09/2016 10/30/2016 10/31/2016   Cholestrol 0 - 200 mg/dL - - - - 166   LDLCALC 0 - 99 mg/dL - - - - 91   HDL >40 mg/dL - - - - 39(L)    Trlycerides <150 mg/dL - - - - 181(H)   Hemoglobin A1c 4.8 - 5.6 % - - - 6.9(H) -   PHART 7.350 - 7.450 - 7.338(L) - - -   PCO2ART 32.0 - 48.0 mmHg - 57.7(H) - - -   HCO3 20.0 - 28.0 mmol/L - 31.0(H) 29.1(H) - -   TCO2 22 - 32 mmol/L 31 33(H) 31 - -   ACIDBASEDEF 0.0 - 2.0 mmol/L - - - - -   O2SAT % - 95.0 65.0 - -  Capillary Blood Glucose: Lab Results  Component Value Date   GLUCAP 116 (H) 01/07/2017   GLUCAP 108 (H) 01/05/2017   GLUCAP 128 (H) 12/29/2016   GLUCAP 110 (H) 12/29/2016   GLUCAP 121 (H) 12/24/2016     Exercise Target Goals:    Exercise Program Goal: Individual exercise prescription set with THRR, safety & activity barriers. Participant demonstrates ability to understand and report RPE using BORG scale, to self-measure pulse accurately, and to acknowledge the importance of the exercise prescription.  Exercise Prescription Goal: Starting with aerobic activity 30 plus minutes a day, 3 days per week for initial exercise prescription. Provide home exercise prescription and guidelines that participant acknowledges understanding prior to discharge.  Activity Barriers & Risk Stratification: Activity Barriers & Cardiac Risk Stratification - 12/18/16 0925      Activity Barriers & Cardiac Risk Stratification   Activity Barriers  Joint Problems;Muscular Weakness;Shortness of Breath;Balance Concerns;Other (comment)    Comments  L broken shoulder, hx of L broken foot and ankle    Cardiac Risk Stratification  High       6 Minute Walk: 6 Minute Walk    Row Name 12/18/16 1242         6 Minute Walk   Phase  Initial     Distance  716 feet     Walk Time  3.25 minutes     # of Rest Breaks  3     MPH  2.5     METS  0.78     RPE  13     Perceived Dyspnea   3     Symptoms  Yes (comment)     Comments  fatigue/SOB. Pt took 3 rest breaks for a total of 2 minutes and 35 seconds     Resting HR  68 bpm     Resting BP  144/82     Resting Oxygen Saturation   96 %      Exercise Oxygen Saturation  during 6 min walk  94 %     Max Ex. HR  85 bpm     Max Ex. BP  164/84     2 Minute Post BP  122/72        Oxygen Initial Assessment:   Oxygen Re-Evaluation:   Oxygen Discharge (Final Oxygen Re-Evaluation):   Initial Exercise Prescription: Initial Exercise Prescription - 12/18/16 1200      Date of Initial Exercise RX and Referring Provider   Date  12/18/16    Referring Provider  Croitoru, [name] MD, and Bensimhon, Daniel MD      Recumbant Bike   Level  --    Minutes  --    METs  --      NuStep   Level  1    SPM  50    Minutes  15    METs  1      Arm Ergometer   Level  1    Minutes  15    METs  1      Prescription Details   Frequency (times per week)  3    Duration  Progress to 30 minutes of continuous aerobic without signs/symptoms of physical distress      Intensity   THRR 40-80% of Max Heartrate  59-118    Ratings of Perceived Exertion  11-15    Perceived Dyspnea  0-4      Progression   Progression  Continue to progress workloads to maintain intensity without signs/symptoms of physical distress.  Resistance Training   Training Prescription  Yes    Weight  1lb    Reps  10-15       Perform Capillary Blood Glucose checks as needed.  Exercise Prescription Changes: Exercise Prescription Changes    Row Name 12/24/16 1528             Response to Exercise   Blood Pressure (Admit)  117/72       Blood Pressure (Exercise)  120/80       Blood Pressure (Exit)  114/70       Heart Rate (Admit)  67 bpm       Heart Rate (Exercise)  78 bpm       Heart Rate (Exit)  63 bpm       Rating of Perceived Exertion (Exercise)  13       Symptoms  none       Comments  pt oriented to exercise equipment today       Duration  Progress to 30 minutes of  aerobic without signs/symptoms of physical distress       Intensity  THRR unchanged         Progression   Progression  Continue to progress workloads to maintain intensity without  signs/symptoms of physical distress.       Average METs  1.6         Resistance Training   Training Prescription  No relaxation day         NuStep   Level  1       SPM  70       Minutes  20       METs  1.6          Exercise Comments: Exercise Comments    Row Name 12/24/16 1620 12/25/16 1535 01/06/17 1619       Exercise Comments  Pt oriented to exercise equipment today. Pt responded well to first exercise session.  Pt oriented to exercise equipment today. Pt responded well to first exercise session.  Pt oriented to exercise equipment today. Pt responded well to first exercise session.        Exercise Goals and Review: Exercise Goals    Row Name 12/18/16 0926             Exercise Goals   Increase Physical Activity  Yes       Intervention  Provide advice, education, support and counseling about physical activity/exercise needs.;Develop an individualized exercise prescription for aerobic and resistive training based on initial evaluation findings, risk stratification, comorbidities and participant's personal goals.       Expected Outcomes  Achievement of increased cardiorespiratory fitness and enhanced flexibility, muscular endurance and strength shown through measurements of functional capacity and personal statement of participant.       Increase Strength and Stamina  Yes       Intervention  Provide advice, education, support and counseling about physical activity/exercise needs.;Develop an individualized exercise prescription for aerobic and resistive training based on initial evaluation findings, risk stratification, comorbidities and participant's personal goals.       Expected Outcomes  Achievement of increased cardiorespiratory fitness and enhanced flexibility, muscular endurance and strength shown through measurements of functional capacity and personal statement of participant.       Able to understand and use rate of perceived exertion (RPE) scale  Yes       Intervention   Provide education and explanation on how to use RPE scale  Expected Outcomes  Short Term: Able to use RPE daily in rehab to express subjective intensity level;Long Term:  Able to use RPE to guide intensity level when exercising independently       Able to understand and use Dyspnea scale  Yes       Intervention  Provide education and explanation on how to use Dyspnea scale       Expected Outcomes  Short Term: Able to use Dyspnea scale daily in rehab to express subjective sense of shortness of breath during exertion;Long Term: Able to use Dyspnea scale to guide intensity level when exercising independently       Knowledge and understanding of Target Heart Rate Range (THRR)  Yes       Intervention  Provide education and explanation of THRR including how the numbers were predicted and where they are located for reference       Expected Outcomes  Short Term: Able to state/look up THRR;Long Term: Able to use THRR to govern intensity when exercising independently;Short Term: Able to use daily as guideline for intensity in rehab       Able to check pulse independently  Yes       Intervention  Provide education and demonstration on how to check pulse in carotid and radial arteries.;Review the importance of being able to check your own pulse for safety during independent exercise       Expected Outcomes  Short Term: Able to explain why pulse checking is important during independent exercise;Long Term: Able to check pulse independently and accurately       Understanding of Exercise Prescription  Yes       Intervention  Provide education, explanation, and written materials on patient's individual exercise prescription       Expected Outcomes  Short Term: Able to explain program exercise prescription;Long Term: Able to explain home exercise prescription to exercise independently          Exercise Goals Re-Evaluation : Exercise Goals Re-Evaluation    Row Name 01/06/17 1619             Exercise Goal  Re-Evaluation   Exercise Goals Review  Able to understand and use rate of perceived exertion (RPE) scale;Understanding of Exercise Prescription       Comments  Pt has a great understanding of ExRx and demonstrates proper use of RPE scale with activity.       Expected Outcomes  Pt will continue to improve in cardiorespiratory fitness.            Discharge Exercise Prescription (Final Exercise Prescription Changes): Exercise Prescription Changes - 12/24/16 1528      Response to Exercise   Blood Pressure (Admit)  117/72    Blood Pressure (Exercise)  120/80    Blood Pressure (Exit)  114/70    Heart Rate (Admit)  67 bpm    Heart Rate (Exercise)  78 bpm    Heart Rate (Exit)  63 bpm    Rating of Perceived Exertion (Exercise)  13    Symptoms  none    Comments  pt oriented to exercise equipment today    Duration  Progress to 30 minutes of  aerobic without signs/symptoms of physical distress    Intensity  THRR unchanged      Progression   Progression  Continue to progress workloads to maintain intensity without signs/symptoms of physical distress.    Average METs  1.6      Resistance Training   Training Prescription  No  relaxation day      NuStep   Level  1    SPM  70    Minutes  20    METs  1.6       Nutrition:  Target Goals: Understanding of nutrition guidelines, daily intake of sodium 1500mg , cholesterol 200mg , calories 30% from fat and 7% or less from saturated fats, daily to have 5 or more servings of fruits and vegetables.  Biometrics: Pre Biometrics - 12/18/16 1300      Pre Biometrics   Height  5' 2.25" (1.581 m)    Weight  257 lb 8 oz (116.8 kg)    Waist Circumference  53 inches    Hip Circumference  52 inches    Waist to Hip Ratio  1.02 %    BMI (Calculated)  46.73    Triceps Skinfold  63 mm    % Body Fat  61.2 %    Grip Strength  28 kg    Single Leg Stand  1.23 seconds        Nutrition Therapy Plan and Nutrition Goals: Nutrition Therapy & Goals -  12/19/16 1331      Nutrition Therapy   Diet  Carb Modified, Therapeutic Lifestyle Changes      Personal Nutrition Goals   Nutrition Goal  Pt to identify food quantities necessary to achieve weight loss of 6-24 lb (2.7-10.9 kg) at graduation from cardiac rehab. Goal wt of 233-251 lb desired.       Intervention Plan   Intervention  Prescribe, educate and counsel regarding individualized specific dietary modifications aiming towards targeted core components such as weight, hypertension, lipid management, diabetes, heart failure and other comorbidities.    Expected Outcomes  Short Term Goal: Understand basic principles of dietary content, such as calories, fat, sodium, cholesterol and nutrients.;Long Term Goal: Adherence to prescribed nutrition plan.       Nutrition Discharge: Nutrition Scores: Nutrition Assessments - 12/19/16 1332      MEDFICTS Scores   Pre Score  34       Nutrition Goals Re-Evaluation:   Nutrition Goals Re-Evaluation:   Nutrition Goals Discharge (Final Nutrition Goals Re-Evaluation):   Psychosocial: Target Goals: Acknowledge presence or absence of significant depression and/or stress, maximize coping skills, provide positive support system. Participant is able to verbalize types and ability to use techniques and skills needed for reducing stress and depression.  Initial Review & Psychosocial Screening: Initial Psych Review & Screening - 12/18/16 1204      Initial Review   Current issues with  Current Stress Concerns    Source of Stress Concerns  Chronic Illness;Family;Financial    Comments  [place] is taking care of a son and has little financial resources other than her [place]?  Yes    Concerns  Recent loss of significant other    Comments  Patient husband passed away 2 years ago      Barriers   Psychosocial barriers to participate in program  The patient should benefit from training in stress  management and relaxation.      Screening Interventions   Interventions  Encouraged to exercise;To provide support and resources with identified psychosocial needs;Provide feedback about the scores to participant       Quality of Life Scores: Quality of Life - 12/18/16 1308      Quality of Life Scores   Health/Function Pre  14.57 % Lerlene is dissatisfied with her health  due to her CAD, and diabetes and shortness of breath.    Socioeconomic Pre  25 %    Psych/Spiritual Pre  25.71 %    Family Pre  25.5 %    GLOBAL Pre  20.52 % Ikran hopes coming to cardiac rehab will help her with her overall health       PHQ-9: Recent Review Flowsheet Data    Depression screen [facility] 2/9 12/24/2016 12/18/2016 06/16/2016 11/30/2015 03/12/2014   Decreased Interest 0 0 0 0 0   Down, Depressed, Hopeless 0 1  0 0 1   PHQ - 2 Score 0 1 0 0 1     Interpretation of Total Score  Total Score Depression Severity:  1-4 = Minimal depression, 5-9 = Mild depression, 10-14 = Moderate depression, 15-19 = Moderately severe depression, 20-27 = Severe depression   Psychosocial Evaluation and Intervention:   Psychosocial Re-Evaluation: Psychosocial Re-Evaluation    [place] Name 01/09/17 1524             Psychosocial Re-Evaluation   Current issues with  Current Stress Concerns       Comments  Davionna continues to have stress regarding her family dynamics       Interventions  Stress management education       Continue Psychosocial Services   Follow up required by staff       Comments  Rosslyn is taking care of a son and has little financial resources other than her social security         Initial Review   Source of Stress Concerns  Chronic Illness;Family;Financial          Psychosocial Discharge (Final Psychosocial Re-Evaluation): Psychosocial Re-Evaluation - 01/09/17 1524      Psychosocial Re-Evaluation   Current issues with  Current Stress Concerns    Comments  Genevieve continues to have stress regarding  her family dynamics    Interventions  Stress management education    Continue Psychosocial Services   Follow up required by staff    Comments  Briteny is taking care of a son and has little financial resources other than her social security      Initial Review   Source of Stress Concerns  Chronic Illness;Family;Financial       Vocational Rehabilitation: Provide vocational rehab assistance to qualifying candidates.   Vocational Rehab Evaluation & Intervention: Vocational Rehab - 12/18/16 1203      Initial Vocational Rehab Evaluation & Intervention   Assessment shows need for Vocational Rehabilitation  Yes Mrs Lasker said he would like to work if possible       Education: Education Goals: Education classes will be provided on a weekly basis, covering required topics. Participant will state understanding/return demonstration of topics presented.  Learning Barriers/Preferences: Learning Barriers/Preferences - 12/18/16 1150      Learning Barriers/Preferences   Learning Barriers  Sight    Learning Preferences  Written Material;Individual Instruction;Skilled Demonstration       Education Topics: Count Your Pulse:  -Group instruction provided by verbal instruction, demonstration, patient participation and written materials to support subject.  Instructors address importance of being able to find your pulse and how to count your pulse when at home without a heart monitor.  Patients get hands on experience counting their pulse with staff help and individually.   Heart Attack, Angina, and Risk Factor Modification:  -Group instruction provided by verbal instruction, video, and written materials to support subject.  Instructors address signs and symptoms of angina and heart attacks.  Also discuss risk factors for heart disease and how to make changes to improve heart health risk factors.   Functional Fitness:  -Group instruction provided by verbal instruction, demonstration, patient  participation, and written materials to support subject.  Instructors address safety measures for doing things around the house.  Discuss how to get up and down off the floor, how to pick things up properly, how to safely get out of a chair without assistance, and balance training.   Meditation and Mindfulness:  -Group instruction provided by verbal instruction, patient participation, and written materials to support subject.  Instructor addresses importance of mindfulness and meditation practice to help reduce stress and improve awareness.  Instructor also leads participants through a meditation exercise.    Stretching for Flexibility and Mobility:  -Group instruction provided by verbal instruction, patient participation, and written materials to support subject.  Instructors lead participants through series of stretches that are designed to increase flexibility thus improving mobility.  These stretches are additional exercise for major muscle groups that are typically performed during regular warm up and cool down.   Hands Only CPR:  -Group verbal, video, and participation provides a basic overview of AHA guidelines for community CPR. Role-play of emergencies allow participants the opportunity to practice calling for help and chest compression technique with discussion of AED use.   Hypertension: -Group verbal and written instruction that provides a basic overview of hypertension including the most recent diagnostic guidelines, risk factor reduction with self-care instructions and medication management.    Nutrition I class: Heart Healthy Eating:  -Group instruction provided by PowerPoint slides, verbal discussion, and written materials to support subject matter. The instructor gives an explanation and review of the Therapeutic Lifestyle Changes diet recommendations, which includes a discussion on lipid goals, dietary fat, sodium, fiber, plant stanol/sterol esters, sugar, and the components of  a well-balanced, healthy diet.   Nutrition II class: Lifestyle Skills:  -Group instruction provided by PowerPoint slides, verbal discussion, and written materials to support subject matter. The instructor gives an explanation and review of label reading, grocery shopping for heart health, heart healthy recipe modifications, and ways to make healthier choices when eating out.   Diabetes Question & Answer:  -Group instruction provided by PowerPoint slides, verbal discussion, and written materials to support subject matter. The instructor gives an explanation and review of diabetes co-morbidities, pre- and post-prandial blood glucose goals, pre-exercise blood glucose goals, signs, symptoms, and treatment of hypoglycemia and hyperglycemia, and foot care basics.   Diabetes Blitz:  -Group instruction provided by PowerPoint slides, verbal discussion, and written materials to support subject matter. The instructor gives an explanation and review of the physiology behind type 1 and type 2 diabetes, diabetes medications and rational behind using different medications, pre- and post-prandial blood glucose recommendations and Hemoglobin A1c goals, diabetes diet, and exercise including blood glucose guidelines for exercising safely.    Portion Distortion:  -Group instruction provided by PowerPoint slides, verbal discussion, written materials, and food models to support subject matter. The instructor gives an explanation of serving size versus portion size, changes in portions sizes over the last 20 years, and what consists of a serving from each food group.   Stress Management:  -Group instruction provided by verbal instruction, video, and written materials to support subject matter.  Instructors review role of stress in heart disease and how to cope with stress positively.     Exercising on Your Own:  -Group instruction provided by verbal instruction, power point, and written materials  to support  subject.  Instructors discuss benefits of exercise, components of exercise, frequency and intensity of exercise, and end points for exercise.  Also discuss use of nitroglycerin and activating EMS.  Review options of places to exercise outside of rehab.  Review guidelines for sex with heart disease.   Cardiac Drugs I:  -Group instruction provided by verbal instruction and written materials to support subject.  Instructor reviews cardiac drug classes: antiplatelets, anticoagulants, beta blockers, and statins.  Instructor discusses reasons, side effects, and lifestyle considerations for each drug class.   CARDIAC REHAB PHASE II EXERCISE from 01/07/2017 in [place]  Date  12/24/16  Instruction Review Code  2- meets goals/outcomes      Cardiac Drugs II:  -Group instruction provided by verbal instruction and written materials to support subject.  Instructor reviews cardiac drug classes: angiotensin converting enzyme inhibitors (ACE-I), angiotensin II receptor blockers (ARBs), nitrates, and calcium channel blockers.  Instructor discusses reasons, side effects, and lifestyle considerations for each drug class.   Anatomy and Physiology of the Circulatory System:  Group verbal and written instruction and models provide basic cardiac anatomy and physiology, with the coronary electrical and arterial systems. Review of: AMI, Angina, Valve disease, Heart Failure, Peripheral Artery Disease, Cardiac Arrhythmia, Pacemakers, and the ICD.   CARDIAC REHAB PHASE II EXERCISE from 01/07/2017 in [place]  Date  01/07/17  Instruction Review Code  2- meets goals/outcomes      Other Education:  -Group or individual verbal, written, or video instructions that support the educational goals of the cardiac rehab program.   Knowledge Questionnaire Score: Knowledge Questionnaire Score - 12/18/16 1150      Knowledge Questionnaire Score   Pre Score   13/24       Core Components/Risk Factors/Patient Goals at Admission: Personal Goals and Risk Factors at Admission - 12/18/16 1302      Core Components/Risk Factors/Patient Goals on Admission    Weight Management  Yes;Obesity;Weight Maintenance;Weight Loss    Intervention  Weight Management: Develop a combined nutrition and exercise program designed to reach desired caloric intake, while maintaining appropriate intake of nutrient and fiber, sodium and fats, and appropriate energy expenditure required for the weight goal.;Weight Management: Provide education and appropriate resources to help participant work on and attain dietary goals.;Weight Management/Obesity: Establish reasonable short term and long term weight goals.    Admit Weight  257 lb 8 oz (116.8 kg)    Expected Outcomes  Short Term: Continue to assess and modify interventions until short term weight is achieved;Long Term: Adherence to nutrition and physical activity/exercise program aimed toward attainment of established weight goal;Weight Maintenance: Understanding of the daily nutrition guidelines, which includes 25-35% calories from fat, 7% or less cal from saturated fats, less than 200mg  cholesterol, less than 1.5gm of sodium, & 5 or more servings of fruits and vegetables daily;Weight Loss: Understanding of general recommendations for a balanced deficit meal plan, which promotes 1-2 lb weight loss per week and includes a negative energy balance of [phone] kcal/d;Understanding recommendations for meals to include 15-35% energy as protein, 25-35% energy from fat, 35-60% energy from carbohydrates, less than 200mg  of dietary cholesterol, 20-35 gm of total fiber daily;Understanding of distribution of calorie intake throughout the day with the consumption of 4-5 meals/snacks    Improve shortness of breath with ADL's  Yes    Intervention  Provide education, individualized exercise plan and daily activity instruction to help decrease symptoms of  SOB with  activities of daily living.    Expected Outcomes  Short Term: Achieves a reduction of symptoms when performing activities of daily living.    Diabetes  Yes    Intervention  Provide education about signs/symptoms and action to take for hypo/hyperglycemia.;Provide education about proper nutrition, including hydration, and aerobic/resistive exercise prescription along with prescribed medications to achieve blood glucose in normal ranges: Fasting glucose 65-99 mg/dL    Expected Outcomes  Short Term: Participant verbalizes understanding of the signs/symptoms and immediate care of hyper/hypoglycemia, proper foot care and importance of medication, aerobic/resistive exercise and nutrition plan for blood glucose control.;Long Term: Attainment of HbA1C < 7%.    Heart Failure  Yes    Intervention  Provide a combined exercise and nutrition program that is supplemented with education, support and counseling about heart failure. Directed toward relieving symptoms such as shortness of breath, decreased exercise tolerance, and extremity edema.    Expected Outcomes  Improve functional capacity of life;Short term: Attendance in program 2-3 days a week with increased exercise capacity. Reported lower sodium intake. Reported increased fruit and vegetable intake. Reports medication compliance.;Short term: Daily weights obtained and reported for increase. Utilizing diuretic protocols set by physician.;Long term: Adoption of self-care skills and reduction of barriers for early signs and symptoms recognition and intervention leading to self-care maintenance.    Hypertension  Yes    Intervention  Provide education on lifestyle modifcations including regular physical activity/exercise, weight management, moderate sodium restriction and increased consumption of fresh fruit, vegetables, and low fat dairy, alcohol moderation, and smoking cessation.;Monitor prescription use compliance.    Expected Outcomes  Short Term:  Continued assessment and intervention until BP is < 140/68mm HG in hypertensive participants. < 130/81mm HG in hypertensive participants with diabetes, heart failure or chronic kidney disease.;Long Term: Maintenance of blood pressure at goal levels.    Lipids  Yes    Intervention  Provide education and support for participant on nutrition & aerobic/resistive exercise along with prescribed medications to achieve LDL 70mg , HDL >40mg .    Expected Outcomes  Short Term: Participant states understanding of desired cholesterol values and is compliant with medications prescribed. Participant is following exercise prescription and nutrition guidelines.;Long Term: Cholesterol controlled with medications as prescribed, with individualized exercise RX and with personalized nutrition plan. Value goals: LDL < 70mg , HDL > 40 mg.    Stress  Yes    Intervention  Offer individual and/or small group education and counseling on adjustment to heart disease, stress management and health-related lifestyle change. Teach and support self-help strategies.;Refer participants experiencing significant psychosocial distress to appropriate mental health specialists for further evaluation and treatment. When possible, include family members and significant others in education/counseling sessions.    Expected Outcomes  Short Term: Participant demonstrates changes in health-related behavior, relaxation and other stress management skills, ability to obtain effective social support, and compliance with psychotropic medications if prescribed.;Long Term: Emotional wellbeing is indicated by absence of clinically significant psychosocial distress or social isolation.       Core Components/Risk Factors/Patient Goals Review:  Goals and Risk Factor Review    Row Name 01/09/17 1452 01/09/17 1521           Core Components/Risk Factors/Patient Goals Review   Personal Goals Review  Weight Management/Obesity;Diabetes;Hypertension;Lipids;Improve  shortness of breath with ADL's;Heart Failure  -      Review  -  Zendayah's attendance has been fair. Chesni's vital signs and CBG's have been stable as Mallery is diet controlled.      Expected  Outcomes  -  Nyashia will continue to come to exercise to help reduce her risk facotrs and take her medicaitons as presribed         Core Components/Risk Factors/Patient Goals at Discharge (Final Review):  Goals and Risk Factor Review - 01/09/17 1521      Core Components/Risk Factors/Patient Goals Review   Review  Noble's attendance has been fair. Delany's vital signs and CBG's have been stable as Desirea is diet controlled.    Expected Outcomes  Laurell will continue to come to exercise to help reduce her risk facotrs and take her medicaitons as presribed       ITP Comments: ITP Comments    Row Name 12/18/16 0905 01/09/17 1449 01/09/17 1528       ITP Comments  Dr. Fransico Him, Medical Director   30 day ITP review. Paitent has good partipation and attendance.   30 day ITP review. Paitent has good partipation and attendance.        Comments: See ITP comments.Barnet Pall, RN,BSN 01/09/2017 3:31 PM

## 2017-01-12 ENCOUNTER — Encounter (HOSPITAL_COMMUNITY)
Admission: RE | Admit: 2017-01-12 | Discharge: 2017-01-12 | Disposition: A | Discharge status: Home / Self Care | Payer: Medicare HMO | Source: Ambulatory Visit | Attending: Cardiovascular Disease | Admitting: Cardiovascular Disease

## 2017-01-12 DIAGNOSIS — E7800 Pure hypercholesterolemia, unspecified: Secondary | ICD-10-CM | POA: Insufficient documentation

## 2017-01-12 DIAGNOSIS — Z7982 Long term (current) use of aspirin: Secondary | ICD-10-CM | POA: Insufficient documentation

## 2017-01-12 DIAGNOSIS — M1990 Unspecified osteoarthritis, unspecified site: Secondary | ICD-10-CM | POA: Diagnosis not present

## 2017-01-12 DIAGNOSIS — I255 Ischemic cardiomyopathy: Secondary | ICD-10-CM | POA: Insufficient documentation

## 2017-01-12 DIAGNOSIS — K760 Fatty (change of) liver, not elsewhere classified: Secondary | ICD-10-CM | POA: Insufficient documentation

## 2017-01-12 DIAGNOSIS — Z79899 Other long term (current) drug therapy: Secondary | ICD-10-CM | POA: Insufficient documentation

## 2017-01-12 DIAGNOSIS — Z8701 Personal history of pneumonia (recurrent): Secondary | ICD-10-CM | POA: Insufficient documentation

## 2017-01-12 DIAGNOSIS — I2510 Atherosclerotic heart disease of native coronary artery without angina pectoris: Secondary | ICD-10-CM | POA: Insufficient documentation

## 2017-01-12 DIAGNOSIS — E119 Type 2 diabetes mellitus without complications: Secondary | ICD-10-CM | POA: Diagnosis not present

## 2017-01-12 DIAGNOSIS — Z6841 Body Mass Index (BMI) 40.0 and over, adult: Secondary | ICD-10-CM | POA: Diagnosis not present

## 2017-01-12 DIAGNOSIS — Z8673 Personal history of transient ischemic attack (TIA), and cerebral infarction without residual deficits: Secondary | ICD-10-CM | POA: Diagnosis not present

## 2017-01-12 DIAGNOSIS — Z955 Presence of coronary angioplasty implant and graft: Secondary | ICD-10-CM | POA: Diagnosis not present

## 2017-01-12 DIAGNOSIS — Z87891 Personal history of nicotine dependence: Secondary | ICD-10-CM | POA: Insufficient documentation

## 2017-01-12 DIAGNOSIS — M109 Gout, unspecified: Secondary | ICD-10-CM | POA: Diagnosis not present

## 2017-01-12 DIAGNOSIS — Z9981 Dependence on supplemental oxygen: Secondary | ICD-10-CM | POA: Diagnosis not present

## 2017-01-12 DIAGNOSIS — Z7951 Long term (current) use of inhaled steroids: Secondary | ICD-10-CM | POA: Diagnosis not present

## 2017-01-12 DIAGNOSIS — I110 Hypertensive heart disease with heart failure: Secondary | ICD-10-CM | POA: Diagnosis not present

## 2017-01-12 DIAGNOSIS — J45909 Unspecified asthma, uncomplicated: Secondary | ICD-10-CM | POA: Diagnosis not present

## 2017-01-12 DIAGNOSIS — Z95810 Presence of automatic (implantable) cardiac defibrillator: Secondary | ICD-10-CM | POA: Diagnosis not present

## 2017-01-12 DIAGNOSIS — I509 Heart failure, unspecified: Secondary | ICD-10-CM | POA: Diagnosis not present

## 2017-01-12 DIAGNOSIS — E669 Obesity, unspecified: Secondary | ICD-10-CM | POA: Insufficient documentation

## 2017-01-12 DIAGNOSIS — K219 Gastro-esophageal reflux disease without esophagitis: Secondary | ICD-10-CM | POA: Diagnosis not present

## 2017-01-12 DIAGNOSIS — Z7902 Long term (current) use of antithrombotics/antiplatelets: Secondary | ICD-10-CM | POA: Insufficient documentation

## 2017-01-12 NOTE — Progress Notes (Signed)
[name] have reviewed a Home Exercise Prescription with [name] . [name] is not currently exercising at home. The patient was advised to walk 2-3 days a week in 5-10 minutes bouts as well as chair exercises.  [name] discussed how to progress her exercise prescription.  The patient stated that her goal were to be able to walk her dog again. Pt was encouraged to walk with sons outside and not alone. The patient stated that they understand the exercise prescription. We reviewed exercise guidelines, target heart rate during exercise, oxygen use, weather, home pulse oximeter, endpoints for exercise, and goals.  Patient is encouraged to come to me with any questions. [name] will continue to follow up with the patient to assist them with progression and safety.   Alicia Lair MS, ACSM CEP 01/12/2017 [phone]

## 2017-01-14 ENCOUNTER — Other Ambulatory Visit: Payer: Self-pay | Admitting: Family Medicine

## 2017-01-14 ENCOUNTER — Encounter (HOSPITAL_COMMUNITY)
Admission: RE | Admit: 2017-01-14 | Discharge: 2017-01-14 | Disposition: A | Discharge status: Home / Self Care | Payer: Medicare HMO | Source: Ambulatory Visit | Attending: Surgery | Admitting: Surgery

## 2017-01-14 DIAGNOSIS — M79605 Pain in left leg: Secondary | ICD-10-CM

## 2017-01-14 DIAGNOSIS — Z955 Presence of coronary angioplasty implant and graft: Secondary | ICD-10-CM | POA: Diagnosis not present

## 2017-01-14 DIAGNOSIS — Z7951 Long term (current) use of inhaled steroids: Secondary | ICD-10-CM | POA: Diagnosis not present

## 2017-01-14 DIAGNOSIS — Z79899 Other long term (current) drug therapy: Secondary | ICD-10-CM | POA: Diagnosis not present

## 2017-01-14 DIAGNOSIS — M1990 Unspecified osteoarthritis, unspecified site: Secondary | ICD-10-CM | POA: Diagnosis not present

## 2017-01-14 DIAGNOSIS — Z7982 Long term (current) use of aspirin: Secondary | ICD-10-CM | POA: Diagnosis not present

## 2017-01-14 DIAGNOSIS — Z7902 Long term (current) use of antithrombotics/antiplatelets: Secondary | ICD-10-CM | POA: Diagnosis not present

## 2017-01-14 DIAGNOSIS — Z9981 Dependence on supplemental oxygen: Secondary | ICD-10-CM | POA: Diagnosis not present

## 2017-01-14 DIAGNOSIS — I2510 Atherosclerotic heart disease of native coronary artery without angina pectoris: Secondary | ICD-10-CM | POA: Diagnosis not present

## 2017-01-14 DIAGNOSIS — Z95810 Presence of automatic (implantable) cardiac defibrillator: Secondary | ICD-10-CM | POA: Diagnosis not present

## 2017-01-14 NOTE — Progress Notes (Signed)
[name] 71 y.o. female DOB: 1945/11/24 MRN: [id number]      Nutrition Note  Dx: DES x 4, CFX Note Spoke with pt. Nutrition plan and survey reviewed with pt. Pt wants to lose wt. Pt has been trying to follow a "no carb diet." Pt wt today 257.4 lb (117 kg). Pt reports she has eaten more sweets recently "because my son likes sweets." Pt is a diet-controlled diabetic. Per discussion, pt checks her CBG's 1-2 times daily. Fasting CBG's reportedly 106-120 mg/dL. Pt expressed understanding of the information reviewed. Pt aware of nutrition education classes offered.  Nutrition Diagnosis ? Food-and nutrition-related knowledge deficit related to lack of exposure to information as related to diagnosis of: ? CVD ? DM ? Obesity related to excessive energy intake as evidenced by a BMI of 46.7  Nutrition Intervention ? Pt's individual nutrition plan and goals reviewed with pt. ? Benefits of adopting Heart Healthy diet discussed when [employer] reviewed.   ? Pt given handouts for: ? Nutrition I class ? Nutrition II class ? Diabetes Blitz Class   Nutrition Goal(s):  ? Pt to identify food quantities necessary to achieve weight loss of 6-24 lb (2.7-10.9 kg) at graduation from cardiac rehab. Goal wt of 233-251 lb desired.   Plan:  Pt to attend nutrition classes ? Nutrition I ? Nutrition II ? Portion Distortion ? Diabetes Blitz ? Diabetes Q & A Will provide client-centered nutrition education as part of interdisciplinary care.   Monitor and evaluate progress toward nutrition goal with team.  Derek Mound, M.Ed, RD, LDN, CDE 01/14/2017 12:18 PM

## 2017-01-14 NOTE — Progress Notes (Signed)
Pt in today for cardiac rehab.  Pt reported a new onset of left leg outer knee pain.  Pt assessed by Andi Hence RN who indicated negative homans sign and no swelling.  Pt advised to follow up with primary MD - Darcus Austin.   Called Dr. Inda Merlin office and scheduled pt for appt to be evaluated at 3:15.  Pt verbalized understanding and is in agreement of this.  Pt able to exercise and held her left leg down while on the stepper for 30 minutes.  Will follow back up on Friday when she returns. Cherre Huger, BSN Cardiac and Training and development officer

## 2017-01-15 ENCOUNTER — Other Ambulatory Visit: Payer: Medicare HMO

## 2017-01-16 ENCOUNTER — Telehealth (HOSPITAL_COMMUNITY): Payer: Self-pay | Admitting: *Deleted

## 2017-01-16 ENCOUNTER — Encounter (HOSPITAL_COMMUNITY): Payer: Medicare HMO

## 2017-01-16 ENCOUNTER — Ambulatory Visit
Admission: RE | Admit: 2017-01-16 | Discharge: 2017-01-16 | Disposition: A | Discharge status: Home / Self Care | Payer: Medicare HMO | Source: Ambulatory Visit | Attending: Family Medicine | Admitting: Family Medicine

## 2017-01-16 DIAGNOSIS — M79605 Pain in left leg: Secondary | ICD-10-CM

## 2017-01-16 DIAGNOSIS — R600 Localized edema: Secondary | ICD-10-CM | POA: Diagnosis not present

## 2017-01-16 NOTE — Telephone Encounter (Signed)
Pt called to let rehab staff know she did go see her primary MD.  Pt is to have ultrasound to rule out blood clot.  Pt is to have this today at [facility].  Pt reports no real change in her level of discomfort but did note that when she walked around [place] yesterday her leg was "sorer".  Advised pt to hold off on exercise today until the scan was completed, reviewed and cleared to return to exercise. Cherre Huger, BSN Cardiac and Pulmonary Rehab Nurse Navigator  \

## 2017-01-19 ENCOUNTER — Encounter (HOSPITAL_COMMUNITY): Payer: Medicare HMO

## 2017-01-21 ENCOUNTER — Encounter (HOSPITAL_COMMUNITY): Payer: Medicare HMO

## 2017-01-23 ENCOUNTER — Encounter (HOSPITAL_COMMUNITY)
Admission: RE | Admit: 2017-01-23 | Discharge: 2017-01-23 | Disposition: A | Discharge status: Home / Self Care | Payer: Medicare HMO | Source: Ambulatory Visit | Attending: Cardiovascular Disease | Admitting: Cardiovascular Disease

## 2017-01-23 DIAGNOSIS — Z955 Presence of coronary angioplasty implant and graft: Secondary | ICD-10-CM

## 2017-01-23 DIAGNOSIS — Z95810 Presence of automatic (implantable) cardiac defibrillator: Secondary | ICD-10-CM | POA: Diagnosis not present

## 2017-01-23 DIAGNOSIS — Z7982 Long term (current) use of aspirin: Secondary | ICD-10-CM | POA: Diagnosis not present

## 2017-01-23 DIAGNOSIS — I2510 Atherosclerotic heart disease of native coronary artery without angina pectoris: Secondary | ICD-10-CM | POA: Diagnosis not present

## 2017-01-23 DIAGNOSIS — M722 Plantar fascial fibromatosis: Secondary | ICD-10-CM | POA: Diagnosis not present

## 2017-01-23 DIAGNOSIS — M19072 Primary osteoarthritis, left ankle and foot: Secondary | ICD-10-CM | POA: Diagnosis not present

## 2017-01-23 DIAGNOSIS — M84375A Stress fracture, left foot, initial encounter for fracture: Secondary | ICD-10-CM | POA: Diagnosis not present

## 2017-01-23 DIAGNOSIS — Z9981 Dependence on supplemental oxygen: Secondary | ICD-10-CM | POA: Diagnosis not present

## 2017-01-23 DIAGNOSIS — M7989 Other specified soft tissue disorders: Secondary | ICD-10-CM | POA: Diagnosis not present

## 2017-01-23 DIAGNOSIS — Z7902 Long term (current) use of antithrombotics/antiplatelets: Secondary | ICD-10-CM | POA: Diagnosis not present

## 2017-01-23 DIAGNOSIS — Z7951 Long term (current) use of inhaled steroids: Secondary | ICD-10-CM | POA: Diagnosis not present

## 2017-01-23 DIAGNOSIS — M1990 Unspecified osteoarthritis, unspecified site: Secondary | ICD-10-CM | POA: Diagnosis not present

## 2017-01-23 DIAGNOSIS — Z79899 Other long term (current) drug therapy: Secondary | ICD-10-CM | POA: Diagnosis not present

## 2017-01-23 DIAGNOSIS — M71572 Other bursitis, not elsewhere classified, left ankle and foot: Secondary | ICD-10-CM | POA: Diagnosis not present

## 2017-01-25 DIAGNOSIS — J9611 Chronic respiratory failure with hypoxia: Secondary | ICD-10-CM | POA: Diagnosis not present

## 2017-01-25 DIAGNOSIS — I5032 Chronic diastolic (congestive) heart failure: Secondary | ICD-10-CM | POA: Diagnosis not present

## 2017-01-25 DIAGNOSIS — J4520 Mild intermittent asthma, uncomplicated: Secondary | ICD-10-CM | POA: Diagnosis not present

## 2017-01-25 DIAGNOSIS — R0602 Shortness of breath: Secondary | ICD-10-CM | POA: Diagnosis not present

## 2017-01-25 DIAGNOSIS — R269 Unspecified abnormalities of gait and mobility: Secondary | ICD-10-CM | POA: Diagnosis not present

## 2017-01-25 DIAGNOSIS — I10 Essential (primary) hypertension: Secondary | ICD-10-CM | POA: Diagnosis not present

## 2017-01-26 ENCOUNTER — Encounter (HOSPITAL_COMMUNITY)
Admission: RE | Admit: 2017-01-26 | Discharge: 2017-01-26 | Disposition: A | Discharge status: Home / Self Care | Payer: Medicare HMO | Source: Ambulatory Visit | Attending: Cardiovascular Disease | Admitting: Cardiovascular Disease

## 2017-01-26 DIAGNOSIS — M1990 Unspecified osteoarthritis, unspecified site: Secondary | ICD-10-CM | POA: Diagnosis not present

## 2017-01-26 DIAGNOSIS — Z7982 Long term (current) use of aspirin: Secondary | ICD-10-CM | POA: Diagnosis not present

## 2017-01-26 DIAGNOSIS — Z9981 Dependence on supplemental oxygen: Secondary | ICD-10-CM | POA: Diagnosis not present

## 2017-01-26 DIAGNOSIS — Z7902 Long term (current) use of antithrombotics/antiplatelets: Secondary | ICD-10-CM | POA: Diagnosis not present

## 2017-01-26 DIAGNOSIS — Z7951 Long term (current) use of inhaled steroids: Secondary | ICD-10-CM | POA: Diagnosis not present

## 2017-01-26 DIAGNOSIS — Z955 Presence of coronary angioplasty implant and graft: Secondary | ICD-10-CM

## 2017-01-26 DIAGNOSIS — Z79899 Other long term (current) drug therapy: Secondary | ICD-10-CM | POA: Diagnosis not present

## 2017-01-26 DIAGNOSIS — Z95810 Presence of automatic (implantable) cardiac defibrillator: Secondary | ICD-10-CM | POA: Diagnosis not present

## 2017-01-26 DIAGNOSIS — I2510 Atherosclerotic heart disease of native coronary artery without angina pectoris: Secondary | ICD-10-CM | POA: Diagnosis not present

## 2017-01-26 NOTE — Progress Notes (Signed)
[name] returned to exercise today. Received the okay from Dr [name] , [name]'s podiatrist for her to exercise at cardiac rehab with her boot. Dr Moses Manners also cleared [name] to resume exercise today. Vital signs were stable.Will continue to monitor the patient throughout  the program.[name] Alicia Maxon, RN,BSN 01/26/2017 5:07 PM

## 2017-01-28 ENCOUNTER — Encounter (HOSPITAL_COMMUNITY)
Admission: RE | Admit: 2017-01-28 | Discharge: 2017-01-28 | Disposition: A | Discharge status: Home / Self Care | Payer: Medicare HMO | Source: Ambulatory Visit | Attending: Cardiovascular Disease | Admitting: Cardiovascular Disease

## 2017-01-28 DIAGNOSIS — I2510 Atherosclerotic heart disease of native coronary artery without angina pectoris: Secondary | ICD-10-CM | POA: Diagnosis not present

## 2017-01-28 DIAGNOSIS — M1990 Unspecified osteoarthritis, unspecified site: Secondary | ICD-10-CM | POA: Diagnosis not present

## 2017-01-28 DIAGNOSIS — Z79899 Other long term (current) drug therapy: Secondary | ICD-10-CM | POA: Diagnosis not present

## 2017-01-28 DIAGNOSIS — Z95810 Presence of automatic (implantable) cardiac defibrillator: Secondary | ICD-10-CM | POA: Diagnosis not present

## 2017-01-28 DIAGNOSIS — Z7902 Long term (current) use of antithrombotics/antiplatelets: Secondary | ICD-10-CM | POA: Diagnosis not present

## 2017-01-28 DIAGNOSIS — Z9981 Dependence on supplemental oxygen: Secondary | ICD-10-CM | POA: Diagnosis not present

## 2017-01-28 DIAGNOSIS — Z955 Presence of coronary angioplasty implant and graft: Secondary | ICD-10-CM | POA: Diagnosis not present

## 2017-01-28 DIAGNOSIS — Z7982 Long term (current) use of aspirin: Secondary | ICD-10-CM | POA: Diagnosis not present

## 2017-01-28 DIAGNOSIS — Z7951 Long term (current) use of inhaled steroids: Secondary | ICD-10-CM | POA: Diagnosis not present

## 2017-01-30 ENCOUNTER — Encounter (HOSPITAL_COMMUNITY)
Admission: RE | Admit: 2017-01-30 | Discharge: 2017-01-30 | Disposition: A | Discharge status: Home / Self Care | Payer: Medicare HMO | Source: Ambulatory Visit | Attending: Cardiovascular Disease | Admitting: Cardiovascular Disease

## 2017-01-30 ENCOUNTER — Other Ambulatory Visit: Payer: Self-pay

## 2017-01-30 ENCOUNTER — Emergency Department (HOSPITAL_COMMUNITY)
Admission: EM | Admit: 2017-01-30 | Discharge: 2017-01-30 | Disposition: A | Discharge status: Home / Self Care | Payer: Medicare HMO | Attending: Emergency Medicine | Admitting: Emergency Medicine

## 2017-01-30 ENCOUNTER — Encounter (HOSPITAL_COMMUNITY): Payer: Self-pay

## 2017-01-30 ENCOUNTER — Telehealth: Payer: Self-pay | Admitting: Cardiology

## 2017-01-30 ENCOUNTER — Emergency Department (HOSPITAL_COMMUNITY): Payer: Medicare HMO

## 2017-01-30 DIAGNOSIS — M1990 Unspecified osteoarthritis, unspecified site: Secondary | ICD-10-CM | POA: Diagnosis not present

## 2017-01-30 DIAGNOSIS — Z7902 Long term (current) use of antithrombotics/antiplatelets: Secondary | ICD-10-CM | POA: Diagnosis not present

## 2017-01-30 DIAGNOSIS — Z9981 Dependence on supplemental oxygen: Secondary | ICD-10-CM | POA: Diagnosis not present

## 2017-01-30 DIAGNOSIS — Z87891 Personal history of nicotine dependence: Secondary | ICD-10-CM | POA: Diagnosis not present

## 2017-01-30 DIAGNOSIS — E119 Type 2 diabetes mellitus without complications: Secondary | ICD-10-CM | POA: Diagnosis not present

## 2017-01-30 DIAGNOSIS — Z7901 Long term (current) use of anticoagulants: Secondary | ICD-10-CM | POA: Diagnosis not present

## 2017-01-30 DIAGNOSIS — J45909 Unspecified asthma, uncomplicated: Secondary | ICD-10-CM | POA: Diagnosis not present

## 2017-01-30 DIAGNOSIS — Z7982 Long term (current) use of aspirin: Secondary | ICD-10-CM | POA: Diagnosis not present

## 2017-01-30 DIAGNOSIS — Z7951 Long term (current) use of inhaled steroids: Secondary | ICD-10-CM | POA: Diagnosis not present

## 2017-01-30 DIAGNOSIS — I2510 Atherosclerotic heart disease of native coronary artery without angina pectoris: Secondary | ICD-10-CM | POA: Insufficient documentation

## 2017-01-30 DIAGNOSIS — N183 Chronic kidney disease, stage 3 (moderate): Secondary | ICD-10-CM | POA: Insufficient documentation

## 2017-01-30 DIAGNOSIS — Z955 Presence of coronary angioplasty implant and graft: Secondary | ICD-10-CM | POA: Diagnosis not present

## 2017-01-30 DIAGNOSIS — I5031 Acute diastolic (congestive) heart failure: Secondary | ICD-10-CM | POA: Diagnosis not present

## 2017-01-30 DIAGNOSIS — R079 Chest pain, unspecified: Secondary | ICD-10-CM | POA: Insufficient documentation

## 2017-01-30 DIAGNOSIS — I130 Hypertensive heart and chronic kidney disease with heart failure and stage 1 through stage 4 chronic kidney disease, or unspecified chronic kidney disease: Secondary | ICD-10-CM | POA: Diagnosis not present

## 2017-01-30 DIAGNOSIS — Z95810 Presence of automatic (implantable) cardiac defibrillator: Secondary | ICD-10-CM | POA: Diagnosis not present

## 2017-01-30 DIAGNOSIS — Z79899 Other long term (current) drug therapy: Secondary | ICD-10-CM | POA: Diagnosis not present

## 2017-01-30 DIAGNOSIS — Z8673 Personal history of transient ischemic attack (TIA), and cerebral infarction without residual deficits: Secondary | ICD-10-CM | POA: Diagnosis not present

## 2017-01-30 LAB — CBC
HCT: 40.4 % (ref 36.0–46.0)
Hemoglobin: 12.5 g/dL (ref 12.0–15.0)
MCH: 28.3 pg (ref 26.0–34.0)
MCHC: 30.9 g/dL (ref 30.0–36.0)
MCV: 91.4 fL (ref 78.0–100.0)
Platelets: 157 10*3/uL (ref 150–400)
RBC: 4.42 MIL/uL (ref 3.87–5.11)
RDW: 15 % (ref 11.5–15.5)
WBC: 8 10*3/uL (ref 4.0–10.5)

## 2017-01-30 LAB — BASIC METABOLIC PANEL
Anion gap: 7 (ref 5–15)
BUN: 28 mg/dL — ABNORMAL HIGH (ref 6–20)
CO2: 29 mmol/L (ref 22–32)
Calcium: 8.9 mg/dL (ref 8.9–10.3)
Chloride: 101 mmol/L (ref 101–111)
Creatinine, Ser: 1.48 mg/dL — ABNORMAL HIGH (ref 0.44–1.00)
GFR calc Af Amer: 40 mL/min — ABNORMAL LOW (ref >60)
GFR calc non Af Amer: 34 mL/min — ABNORMAL LOW (ref >60)
Glucose, Bld: 117 mg/dL — ABNORMAL HIGH (ref 65–99)
Potassium: 4.4 mmol/L (ref 3.5–5.1)
Sodium: 137 mmol/L (ref 135–145)

## 2017-01-30 LAB — I-STAT TROPONIN, ED: Troponin i, poc: 0 ng/mL (ref 0.00–0.08)

## 2017-01-30 NOTE — ED Notes (Signed)
Ambulated in hallway. SPO2 92% on room air. MD made aware.

## 2017-01-30 NOTE — Telephone Encounter (Signed)
Called from [facility] with patient reporting left shoulder/back pain that started around 9am this morning. In talking with patient over the phone she reports this is very similar to what she experienced when she had her stent placed on 9/18. EKG done but only shows paced rhythm. Instructed that she be taken to the ED for further evaluation.

## 2017-01-30 NOTE — Progress Notes (Signed)
Incomplete Session Note  Patient Details  Name: [name] MRN: [id number] Date of Birth: 02-Mar-1945 Referring Provider:     [place] from 12/18/2016 in [place]  Referring Provider  Croitoru, [name] MD, and Bensimhon, Quillian Quince MD      [name] did not complete her rehab session.  Masa reports having chest discomfort that started this morning at 0900. Jesyca rates the chest discomfort a 5/10 with pain in her back and left shoulder blade. Blood pressure 122/80. Telemetry rhythm V paced rate 58. Oxygen saturation 98% on 2l/min. 12 lead ECG obtained. Harlan Stains NP called and notified. Mendel Ryder recommended that Vinette be taken to the ED for further evaluation. Hiliary called her son to notify about today's events. Patient transported to the ED via stretcher on 2l/min of oxygen.Report given to the ED RN.Barnet Pall, RN,BSN 01/30/2017 4:22 PM

## 2017-01-30 NOTE — ED Notes (Addendum)
Pt. Ambulated to bathroom with steady gait. Pt. SPO2 88%. Pt. Placed on 3L nasal canula.

## 2017-01-30 NOTE — ED Notes (Signed)
MD at bedside. 

## 2017-01-30 NOTE — ED Triage Notes (Signed)
Pt coming from cardiac rehab with pain that starts in her shoulder blades and radiates through to her chest. Pt states it feels like indigestion. She had a cardiac stent placed in September. No acute distress noted.

## 2017-01-30 NOTE — ED Provider Notes (Signed)
[facility] EMERGENCY DEPARTMENT Provider Note   CSN: [id number] Arrival date & time: 01/30/17  1222     History   Chief Complaint Chief Complaint  Patient presents with  . Chest Pain    HPI [name] is a 71 y.o. female.  She complains of chest pain, mid anterior, that comes and goes, since this morning.  She went to cardiac rehab try to exercise in the pain returned so she was sent here for further evaluation.  She had a recent cardiac stent placed, following failed CABG.  She does not have ongoing chronic chest pain.  She denies shortness of breath, fever, chills, cough, nausea or vomiting.  There are no other known modifying factors.    HPI  Past Medical History:  Diagnosis Date  . AICD (automatic cardioverter/defibrillator) present   . Anemia   . Arthritis   . Asthma   . CAD (coronary artery disease)    a. 10/09/16 LHC: SVG->LAD patent, SVG->Diag patent, SVG->RCA patent, SVG->LCx occluded. EF 60%, b. 10/31/16 LHC DES to AV groove Circ, DES to intermed branch  . Cellulitis and abscess of foot 12/2014   RT FOOT  . CHF (congestive heart failure) ([place])   . Chronic bronchitis ([place])   . Chronic lower back pain   . Diverticulosis   . Facial numbness 02/2016  . Fatty liver disease, nonalcoholic   . Gastritis   . GERD (gastroesophageal reflux disease)   . Gout   . H/O hiatal hernia   . High cholesterol   . Hyperplastic colon polyp   . Hypertension   . IBS (irritable bowel syndrome)   . Internal hemorrhoids   . Ischemic cardiomyopathy   . Obesity   . On home oxygen therapy    "2L at night" (10/31/2016)  . OSA (obstructive sleep apnea)    "can't tolerate a mask" (10/30/2016)  . Pneumonia    "couple times" (10/31/2016)  . Shortness of breath   . TIA (transient ischemic attack)    "recently" (10/31/2016)  . Type II diabetes mellitus [facility])     Patient Active Problem List   Diagnosis Date Noted  . Vasomotor rhinitis 10/16/2016  . Right  facial numbness 03/05/2016  . CKD (chronic kidney disease), stage III ([place]) 03/05/2016  . Atrial fibrillation ([place]) 10/24/2015  . Chest pain with moderate risk of acute coronary syndrome 10/23/2015  . History of TIA (transient ischemic attack) 10/22/2015  . Complete heart block ([place]) 06/22/2015  . Allergic drug rash due to anti-infective agent 04/29/2015  . Fatigue 02/14/2015  . Chronic diastolic heart failure ([place]) 02/14/2015  . Cellulitis 12/21/2014  . S/P CABG x 5 11/29/2014  . CAD S/P percutaneous coronary angioplasty   . Acute diastolic HF (heart failure) ([place]) 11/23/2014  . Diarrhea 07/12/2014  . Generalized abdominal pain 07/12/2014  . Diastolic CHF, acute on chronic (HCC) 11/04/2013  . Mixed hypercholesterolemia and hypertriglyceridemia 04/22/2013  . Vertigo 04/22/2013  . Dyspnea on exertion 08/25/2012  . Allergic to IV contrast 07/23/2012  . Unstable angina ([place]) 07/22/2012  . Morbid (severe) obesity due to excess calories ([place]) 11/22/2011  . Nonischemic cardiomyopathy ([place]) 11/22/2011  . GERD (gastroesophageal reflux disease) 11/22/2011  . Back pain 11/22/2011  . OSA (obstructive sleep apnea) 11/22/2011  . HEMORRHOIDS-EXTERNAL 02/21/2010  . NAUSEA 02/21/2010  . ABDOMINAL PAIN -GENERALIZED 02/21/2010  . PERSONAL HX COLONIC POLYPS 02/21/2010  . ANEMIA 04/16/2007  . Essential hypertension 04/16/2007  . DIVERTICULOSIS, COLON 04/16/2007  . ARTHRITIS 04/16/2007  .  INTERNAL HEMORRHOIDS WITHOUT MENTION COMP 04/12/2007  . Asthma 04/12/2007  . Cardiac resynchronization therapy pacemaker (CRT-P) in place 04/12/2007  . ACUTE GASTRITIS WITHOUT MENTION OF HEMORRHAGE 12/14/2006  . COLONIC POLYPS, HYPERPLASTIC 09/27/2002  . FATTY LIVER DISEASE 02/16/2002    Past Surgical History:  Procedure Laterality Date  . ABDOMINAL ULTRASOUND  12/01/2011   Peripelvic cysts- #1- 2.4x1.9x2.3cm, #2-1.2x0.9x1.2cm  . ANKLE FRACTURE SURGERY Right    "had rod put in"  . ANTERIOR CERVICAL  DECOMP/DISCECTOMY FUSION    . APPENDECTOMY    . BACK SURGERY    . BIV ICD INSERTION CRT-D  2001?  [name] BIV PACEMAKER GENERATOR CHANGE OUT N/A 11/02/2012   Procedure: BIV PACEMAKER GENERATOR CHANGE OUT;  Surgeon: Sanda Klein, MD;  Location: [facility] CATH LAB;  Service: Cardiovascular;  Laterality: N/A;  . CARDIAC CATHETERIZATION  05/17/1999   No significant coronary obstructive disease w/ mild 20% luminal irregularity of the first diag branch of the LAD  . CARDIAC CATHETERIZATION  07/08/2002   No significant CAD, moderately depressed LV systolic function  . CARDIAC CATHETERIZATION Bilateral 04/26/2007   Normal findings, recommend medical therapy  . CARDIAC CATHETERIZATION  02/18/2008   Moderate CAD, would benefit from having a functional study, recommend continue medical therapy  . CARDIAC CATHETERIZATION  07/23/2012   Medical therapy  . CARDIAC CATHETERIZATION N/A 11/24/2014   Procedure: Left Heart Cath and Coronary Angiography;  Surgeon: Troy Sine, MD;  Location: [facility] CV LAB;  Service: Cardiovascular;  Laterality: N/A;  . CARDIAC CATHETERIZATION  11/27/2014   Procedure: Intravascular Pressure Wire/FFR Study;  Surgeon: Peter M Martinique, MD;  Location: [place] CV LAB;  Service: Cardiovascular;;  . CARDIAC CATHETERIZATION  10/09/2016  . CORONARY ANGIOGRAM  2010  . CORONARY ARTERY BYPASS GRAFT N/A 11/29/2014   Procedure: CORONARY ARTERY BYPASS GRAFTING x 5 (LIMA-LAD, SVG-D, SVG-OM1-OM2, SVG-PD);  Surgeon: Melrose Nakayama, MD;  Location: [facility];  Service: Open Heart Surgery;  Laterality: N/A;  . CORONARY STENT INTERVENTION N/A 10/31/2016   Procedure: CORONARY STENT INTERVENTION;  Surgeon: Burnell Blanks, MD;  Location: [place] [facility] CV LAB;  Service: Cardiovascular;  Laterality: N/A;  . FRACTURE SURGERY    . INSERT / REPLACE / [place]  . KNEE ARTHROSCOPY Bilateral   . LEFT HEART CATHETERIZATION WITH CORONARY ANGIOGRAM N/A 07/23/2012   Procedure: LEFT HEART  CATHETERIZATION WITH CORONARY ANGIOGRAM;  Surgeon: Leonie Man, MD;  [facility];  Service: Cardiovascular;  Laterality: N/A;  Carlton Adam MYOVIEW  11/14/2011   Mild-moderate defect seen in Mid Inferolateral and Mid Anterolateral regions-consistant w/ infarct/scar. No significant ischemia demonstrated.  [name] RIGHT/LEFT HEART CATH AND CORONARY ANGIOGRAPHY N/A 10/09/2016   Procedure: RIGHT/LEFT HEART CATH AND CORONARY ANGIOGRAPHY;  Surgeon: Jolaine Artist, MD;  Location: [place] CV LAB;  Service: Cardiovascular;  Laterality: N/A;  . TEE WITHOUT CARDIOVERSION N/A 11/29/2014   Procedure: TRANSESOPHAGEAL ECHOCARDIOGRAM (TEE);  Surgeon: Melrose Nakayama, MD;  Location: [facility];  Service: Open Heart Surgery;  Laterality: N/A;  . TRANSTHORACIC ECHOCARDIOGRAM  07/23/2012   EF 55-60%, normal-mild  . TUBAL LIGATION      OB History    No data available       Home Medications    Prior to Admission medications   Medication Sig Start Date End Date Taking? Authorizing Provider  acetaminophen (TYLENOL) 500 MG tablet Take 1,000 mg by mouth every 6 (six) hours as needed (for pain.).   Yes [provider]  albuterol (PROVENTIL) (  2.5 MG/3ML) 0.083% nebulizer solution Take 2.5 mg by nebulization every 6 (six) hours as needed for wheezing or shortness of breath.   Yes [provider]  aspirin EC 81 MG tablet Take 1 tablet (81 mg total) by mouth daily. 03/27/16  Yes Kilroy, Luke K, PA-C  bisoprolol (ZEBETA) 5 MG tablet Take 1 tablet (5 mg total) by mouth daily. 11/13/16  Yes Kilroy, Luke K, PA-C  budesonide-formoterol (SYMBICORT) 160-4.5 MCG/ACT inhaler Inhale 2 puffs into the lungs 2 (two) times daily.   Yes [provider]  cetirizine (ZYRTEC) 10 MG tablet Take 10 mg by mouth daily.    Yes [provider]  clopidogrel (PLAVIX) 75 MG tablet Take 1 tablet (75 mg total) by mouth daily. Patient taking differently: Take 75 mg by mouth at bedtime.  12/07/15  Yes  Almyra Deforest, PA  colchicine 0.6 MG tablet Take one tablet two times daily as needed. Patient taking differently: Take 0.6 mg by mouth 2 (two) times daily as needed (gout flare).  09/30/16  Yes Marybelle Killings, MD  docusate sodium (COLACE) 100 MG capsule Take 200 mg by mouth at bedtime.    Yes [provider]  EPINEPHrine (EPIPEN 2-PAK) 0.3 mg/0.3 mL IJ SOAJ injection Inject 0.3 mg into the muscle once as needed (severe allergic reaction).   Yes [provider]  fluticasone (FLONASE) 50 MCG/ACT nasal spray Place 1 spray into both nostrils at bedtime as needed for rhinitis or allergies.    Yes [provider]  GuaiFENesin (MUCINEX PO) Take 1 tablet by mouth at bedtime.   Yes [provider]  isosorbide mononitrate (IMDUR) 30 MG 24 hr tablet TAKE 1 TABLET EVERY DAY Patient taking differently: TAKE 1 TABLET (30 MG) BY MOUTH EVERY DAY 08/28/16  Yes Croitoru, [name], MD  ketotifen (ZADITOR) 0.025 % ophthalmic solution Place 1 drop into both eyes daily.   Yes [provider]  metolazone (ZAROXOLYN) 5 MG tablet Take 1 tablet (5 mg total) by mouth as needed. Take only as directed by [facility] Failure [facility] Patient taking differently: Take 5 mg by mouth See admin instructions. Take 1 tablet (5 mg) by mouth when directed by [facility] Failure [facility] 07/21/16  Yes Eileen Stanford, PA-C  Multiple Vitamin (MULTIVITAMIN WITH MINERALS) TABS tablet Take 1 tablet by mouth daily.   Yes [provider]  nitroGLYCERIN (NITROSTAT) 0.4 MG SL tablet Place 1 tablet (0.4 mg total) under the tongue every 5 (five) minutes as needed for chest pain. 11/13/16 02/11/17 Yes Kilroy, Luke K, PA-C  OXYGEN Inhale 2 L into the lungs See admin instructions. During waking hours as needed for low oxygen levels & at bedtime each night.   Yes [provider]  pantoprazole (PROTONIX) 40 MG tablet Take 1 tablet (40 mg total) by mouth daily. 12/10/16 12/10/17 Yes Croitoru, [name], MD  potassium  chloride SA (K-DUR,KLOR-CON) 20 MEQ tablet Take 20 mEq by mouth daily.    Yes [provider]  rosuvastatin (CRESTOR) 20 MG tablet TAKE 1 TABLET EVERY DAY Patient taking differently: TAKE 1 TABLET (20 MG) BY MOUTH DAILY AT BEDTIME 11/26/16  Yes Croitoru, [name], MD  spironolactone (ALDACTONE) 25 MG tablet TAKE 1 TABLET EVERY DAY Patient taking differently: TAKE 1 TABLET (25 MG) BY MOUTH DAILY AT BEDTIME 11/26/16  Yes Croitoru, [name], MD  torsemide (DEMADEX) 20 MG tablet Take 2 tablets (40 mg total) by mouth daily. Patient taking differently: Take 40 mg by mouth See admin instructions. Take 2 tablets (40  mg) by mouth every morning, take 1 tablet (20 mg) in the evening as needed for weight gain of 2 lbs in 24 hours 10/03/16  Yes Bensimhon, Shaune Pascal, MD  triamcinolone cream (KENALOG) 0.1 % Apply 1 application topically 2 (two) times daily as needed (itching).  08/24/14  Yes [provider]  VENTOLIN HFA 108 (90 Base) MCG/ACT inhaler INHALE 2 PUFFS EVERY 4 HOURS AS NEEDED FOR WHEEZING 07/10/16  Yes Rigoberto Noel, MD  hydroxypropyl methylcellulose / hypromellose (ISOPTO TEARS / GONIOVISC) 2.5 % ophthalmic solution Place 1 drop into both eyes 4 (four) times daily as needed for dry eyes.     [provider]  ipratropium (ATROVENT) 0.06 % nasal spray 1-2 pffs up to 4 x daily as needed Patient not taking: Reported on 01/30/2017 10/16/16   Tanda Rockers, MD    Family History Family History  Problem Relation Age of Onset  . Breast cancer Mother   . Diabetes Mother   . Heart disease Maternal Grandmother   . Kidney disease Maternal Grandmother   . Diabetes Maternal Grandmother   . Glaucoma Maternal Aunt   . Heart disease Maternal Aunt   . Asthma Sister     Social History Social History   Tobacco Use  . Smoking status: Former Smoker    Packs/day: 0.25    Years: 3.00    Pack years: 0.75    Types: Cigarettes    Last attempt to quit: 02/11/1968    Years since quitting: 49.0    . Smokeless tobacco: Never Used  Substance Use Topics  . Alcohol use: No  . Drug use: No     Allergies   Ivp dye [iodinated diagnostic agents]; Shellfish-derived products; Sulfa antibiotics; Uloric [febuxostat]; Iodine; Atorvastatin; Cephalexin; and Zithromax [azithromycin]   Review of Systems Review of Systems  All other systems reviewed and are negative.    Physical Exam Updated Vital Signs BP (!) 157/79   Pulse 71   Temp 98 F (36.7 C) (Oral)   Resp 20   SpO2 99%   Physical Exam  Constitutional: She is oriented to person, place, and time. She appears well-developed. She does not appear ill.  Obese  HENT:  Head: Normocephalic and atraumatic.  Eyes: Conjunctivae and EOM are normal. Pupils are equal, round, and reactive to light.  Neck: Normal range of motion and phonation normal. Neck supple.  Cardiovascular: Normal rate and regular rhythm.  Pulmonary/Chest: Effort normal and breath sounds normal. She exhibits no tenderness.  Abdominal: Soft. She exhibits no distension. There is no tenderness. There is no guarding.  Musculoskeletal: Normal range of motion.       Right lower leg: She exhibits edema.       Left lower leg: She exhibits edema.  No tenderness or crepitation of the chest wall.  Neurological: She is alert and oriented to person, place, and time. She exhibits normal muscle tone.  Skin: Skin is warm and dry.  Psychiatric: She has a normal mood and affect. Her behavior is normal. Judgment and thought content normal.  Nursing note and vitals reviewed.    ED Treatments / Results  Labs (all labs ordered are listed, but only abnormal results are displayed) Labs Reviewed  BASIC METABOLIC PANEL - Abnormal; Notable for the following components:      Result Value   Glucose, Bld 117 (*)    BUN 28 (*)    Creatinine, Ser 1.48 (*)    GFR calc non Af Amer 34 (*)  GFR calc Af Amer 40 (*)    All other components within normal limits  CBC  I-STAT TROPONIN, ED     EKG  EKG Interpretation  Date/Time:  Friday January 30 2017 12:33:16 EST Ventricular Rate:  68 PR Interval:    QRS Duration: 176 QT Interval:  446 QTC Calculation: 474 R Axis:   -142 Text Interpretation:   Poor data quality, interpretation may be adversely affected Ventricular-paced rhythm Abnormal ECG Since last tracing of earlier today No significant change was found Confirmed by Daleen Bo [phone]) on 01/30/2017 5:32:46 PM       Radiology Dg Chest 2 View  Result Date: 01/30/2017 CLINICAL DATA:  Chest pain /sob,strated this am,hx CAD,chf EXAM: CHEST  2 VIEW COMPARISON:  10/30/2016 FINDINGS: Status post CABG surgery. Cardiac silhouette is mildly enlarged. The aorta is uncoiled and prominent. No mediastinal or hilar masses. No evidence of adenopathy. Right anterior chest wall AICD is stable. Clear lungs.  No pleural effusion or pneumothorax. Skeletal structures are demineralized but grossly intact. IMPRESSION: No acute cardiopulmonary disease. Electronically Signed   By: Lajean Manes M.D.   On: 01/30/2017 13:08    Procedures Procedures (including critical care time)  Medications Ordered in ED Medications - No data to display   Initial Impression / Assessment and Plan / ED Course  I have reviewed the triage vital signs and the nursing notes.  Pertinent labs & imaging results that were available during my care of the patient were reviewed by me and considered in my medical decision making (see chart for details).  Clinical Course as of Jan 31 18  Sat Jan 31, 2017  0018 Normal Troponin i, poc: 0.00 [EW]  0018 Slight elevation Creatinine: (!) 1.48 [EW]  0018 No acute disease DG Chest 2 View [EW]    Clinical Course User Index [EW] Daleen Bo, MD     Patient Vitals for the past 24 hrs:  BP Temp Temp src Pulse Resp SpO2  01/30/17 2100 - - - 71 20 99 %  01/30/17 2030 (!) 157/79 - - 67 (!) 23 99 %  01/30/17 2015 137/79 - - 64 (!) 22 99 %  01/30/17 2000 (!)  159/75 - - 65 (!) 22 98 %  01/30/17 1945 (!) 154/88 - - 64 (!) 23 99 %  01/30/17 1930 (!) 155/81 - - 63 20 98 %  01/30/17 1900 (!) 149/77 - - 62 16 91 %  01/30/17 1845 131/60 - - 61 (!) 25 93 %  01/30/17 1830 126/67 - - 64 (!) 21 94 %  01/30/17 1815 112/65 - - 66 18 95 %  01/30/17 1800 100/63 - - 63 20 97 %  01/30/17 1745 115/73 - - 65 17 96 %  01/30/17 1730 131/72 - - 64 18 95 %  01/30/17 1715 118/81 - - 66 15 95 %  01/30/17 1704 - - - 65 (!) 9 99 %  01/30/17 1703 125/83 - - - (!) 9 -  01/30/17 1616 (!) 118/54 98 F (36.7 C) Oral 65 16 100 %  01/30/17 1310 90/67 97.8 F (36.6 C) Oral 64 20 98 %  01/30/17 1251 123/60 98.2 F (36.8 C) Oral 64 18 99 %    9:33 PM Reevaluation with update and discussion. After initial assessment and treatment, an updated evaluation reveals he is comfortable, has eaten and has no further complaints.Daleen Bo     Final Clinical Impressions(s) / ED Diagnoses   Final diagnoses:  Chest pain, unspecified  type   Chest pain, unlikely to represent coronary ischemia, with negative ED evaluation.  Doubt PE or pneumonia.  Nursing Notes Reviewed/ Care Coordinated Applicable Imaging Reviewed Interpretation of Laboratory Data incorporated into ED treatment  The patient appears reasonably screened and/or stabilized for discharge and I doubt any other medical condition or other [facility] requiring further screening, evaluation, or treatment in the ED at this time prior to discharge.  Plan: Home Medications-continue usual medications; Home Treatments-rest, fluids; return here if the recommended treatment, does not improve the symptoms; Recommended follow up-cardiology, next week for checkup   ED Discharge Orders    None       Daleen Bo, MD 01/31/17 [phone]

## 2017-01-30 NOTE — Discharge Instructions (Addendum)
Continue to take your usual medications.  Call your cardiologist on Monday morning to arrange follow-up care and treatment.  Discuss increasing imdur with cardiologist

## 2017-01-31 DIAGNOSIS — G4733 Obstructive sleep apnea (adult) (pediatric): Secondary | ICD-10-CM | POA: Diagnosis not present

## 2017-01-31 DIAGNOSIS — J4530 Mild persistent asthma, uncomplicated: Secondary | ICD-10-CM | POA: Diagnosis not present

## 2017-02-02 ENCOUNTER — Other Ambulatory Visit: Payer: Self-pay | Admitting: Physician Assistant

## 2017-02-04 ENCOUNTER — Telehealth (HOSPITAL_COMMUNITY): Payer: Self-pay | Admitting: *Deleted

## 2017-02-04 ENCOUNTER — Encounter (HOSPITAL_COMMUNITY): Payer: Medicare HMO

## 2017-02-04 ENCOUNTER — Other Ambulatory Visit: Payer: Self-pay | Admitting: Physician Assistant

## 2017-02-04 DIAGNOSIS — M722 Plantar fascial fibromatosis: Secondary | ICD-10-CM | POA: Diagnosis not present

## 2017-02-04 DIAGNOSIS — M7752 Other enthesopathy of left foot: Secondary | ICD-10-CM | POA: Diagnosis not present

## 2017-02-04 DIAGNOSIS — M659 Synovitis and tenosynovitis, unspecified: Secondary | ICD-10-CM | POA: Diagnosis not present

## 2017-02-04 DIAGNOSIS — M2012 Hallux valgus (acquired), left foot: Secondary | ICD-10-CM | POA: Diagnosis not present

## 2017-02-04 NOTE — Telephone Encounter (Signed)
Refill Request.  

## 2017-02-04 NOTE — Telephone Encounter (Signed)
Please review for refill, Thanks !  

## 2017-02-05 ENCOUNTER — Encounter (INDEPENDENT_AMBULATORY_CARE_PROVIDER_SITE_OTHER): Payer: Self-pay

## 2017-02-05 ENCOUNTER — Ambulatory Visit (INDEPENDENT_AMBULATORY_CARE_PROVIDER_SITE_OTHER): Payer: Medicare HMO | Admitting: Cardiology

## 2017-02-05 ENCOUNTER — Encounter: Payer: Self-pay | Admitting: Cardiology

## 2017-02-05 ENCOUNTER — Encounter (HOSPITAL_COMMUNITY): Payer: Self-pay | Admitting: *Deleted

## 2017-02-05 DIAGNOSIS — I5032 Chronic diastolic (congestive) heart failure: Secondary | ICD-10-CM

## 2017-02-05 DIAGNOSIS — I428 Other cardiomyopathies: Secondary | ICD-10-CM

## 2017-02-05 DIAGNOSIS — I2510 Atherosclerotic heart disease of native coronary artery without angina pectoris: Secondary | ICD-10-CM | POA: Diagnosis not present

## 2017-02-05 DIAGNOSIS — G4733 Obstructive sleep apnea (adult) (pediatric): Secondary | ICD-10-CM

## 2017-02-05 DIAGNOSIS — Z9861 Coronary angioplasty status: Secondary | ICD-10-CM

## 2017-02-05 DIAGNOSIS — Z955 Presence of coronary angioplasty implant and graft: Secondary | ICD-10-CM

## 2017-02-05 DIAGNOSIS — I5031 Acute diastolic (congestive) heart failure: Secondary | ICD-10-CM

## 2017-02-05 DIAGNOSIS — N183 Chronic kidney disease, stage 3 unspecified: Secondary | ICD-10-CM

## 2017-02-05 DIAGNOSIS — R079 Chest pain, unspecified: Secondary | ICD-10-CM | POA: Diagnosis not present

## 2017-02-05 DIAGNOSIS — I10 Essential (primary) hypertension: Secondary | ICD-10-CM | POA: Diagnosis not present

## 2017-02-05 DIAGNOSIS — Z8673 Personal history of transient ischemic attack (TIA), and cerebral infarction without residual deficits: Secondary | ICD-10-CM | POA: Diagnosis not present

## 2017-02-05 DIAGNOSIS — Z951 Presence of aortocoronary bypass graft: Secondary | ICD-10-CM

## 2017-02-05 DIAGNOSIS — J4520 Mild intermittent asthma, uncomplicated: Secondary | ICD-10-CM

## 2017-02-05 MED ORDER — ISOSORBIDE MONONITRATE ER 60 MG PO TB24: 60.0000 mg | ORAL_TABLET | Freq: Every day | ORAL | 3 refills | Status: DC

## 2017-02-05 NOTE — Assessment & Plan Note (Signed)
PCI with DES 10/31/16 to OM and intermediate secondary to [place]. SVG-CFX/OM was occluded

## 2017-02-05 NOTE — Assessment & Plan Note (Signed)
10/16/2016 

## 2017-02-05 NOTE — Assessment & Plan Note (Signed)
Pt was seen in the ED at [facility] 01/30/17 after she developed back-Lt chest pain while at cardiac rehab.

## 2017-02-05 NOTE — Assessment & Plan Note (Signed)
Controlled.  

## 2017-02-05 NOTE — Patient Instructions (Signed)
Medication Instructions:  INCREASE ISOSORBIDE TO 60MG  DAILY  If you need a refill on your cardiac medications before your next appointment, please call your pharmacy.  Follow-Up: Your physician wants you to follow-up in: [place].    Thank you for choosing [facility] at [facility]!!

## 2017-02-05 NOTE — Progress Notes (Signed)
02/05/2017 [name]   1945/12/19  [id number]  Primary Physician Darcus Austin, MD Primary Cardiologist: Dr Croitoru/ Dr [facility]  HPI:  71 y/o morbidly obese female with a history of CHB in 2001, s/p PTVDP then. This was upgraded to a Biv ICD in 2008 secondary to CM (EF 20%). 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 She had CABG x 5 in 2016. Other problems include morbid obesity and chronic diastolic heart failure. She had a cath 10/09/16 fro chest pain which revealed an occluded SVG-CFX-OM. She was scheduled to have a staged intervention on 11/04/16 but ended up in the ED with chest pain 10/30/16. She ruled out for an MI (one POC marker) and underwent PCI 10/31/16 with a DES x3 to the AV groove CFX and DES x 1 to an intermediate branch. She was discharge 11/01/16 and seen 11/13/16 in the office in follow up. She was doing OK then but then had to be seen in the ED 01/30/17 after she developed chest discomfort and a low O2 sat while in cardiac rehab. In the ED it was felt her chest pain was atypical and her O2 came up on nasal oxygen. CXR was negative and her Troponin negative. I believe the ER MD had mentioned admission for observation but the pt tells me she "begged to not come in for Christmas".   She is seeing me in the office today in follow up. She says she is doing well, no chest pain. She now does cardiac rehab with nasal O2 in place.      Current Outpatient Medications  Medication Sig Dispense Refill  . acetaminophen (TYLENOL) 500 MG tablet Take 1,000 mg by mouth every 6 (six) hours as needed (for pain.).    [name] albuterol (PROVENTIL) (2.5 MG/3ML) 0.083% nebulizer solution Take 2.5 mg by nebulization every 6 (six) hours as needed for wheezing or shortness of breath.    [name] aspirin EC 81 MG tablet Take 1 tablet (81 mg total) by mouth daily. 90 tablet 3  . bisoprolol (ZEBETA) 5 MG tablet Take 1 tablet (5 mg total) by mouth  daily. 90 tablet 3  . budesonide-formoterol (SYMBICORT) 160-4.5 MCG/ACT inhaler Inhale 2 puffs into the lungs 2 (two) times daily.    . cetirizine (ZYRTEC) 10 MG tablet Take 10 mg by mouth daily.     . clopidogrel (PLAVIX) 75 MG tablet TAKE 1 TABLET EVERY DAY 90 tablet 2  . colchicine 0.6 MG tablet Take one tablet two times daily as needed. (Patient taking differently: Take 0.6 mg by mouth 2 (two) times daily as needed (gout flare). ) 30 tablet 0  . docusate sodium (COLACE) 100 MG capsule Take 200 mg by mouth at bedtime.     [name] EPINEPHrine (EPIPEN 2-PAK) 0.3 mg/0.3 mL IJ SOAJ injection Inject 0.3 mg into the muscle once as needed (severe allergic reaction).    . fluticasone (FLONASE) 50 MCG/ACT nasal spray Place 1 spray into both nostrils at bedtime as needed for rhinitis or allergies.     . GuaiFENesin (MUCINEX PO) Take 1 tablet by mouth at bedtime.    . hydroxypropyl methylcellulose / hypromellose (ISOPTO TEARS / GONIOVISC) 2.5 % ophthalmic solution Place 1 drop into both eyes 4 (four) times daily as needed for dry eyes.     [name] ipratropium (ATROVENT) 0.06 % nasal spray 1-2 pffs up to 4 x daily as needed 15 mL 12  .  isosorbide mononitrate (IMDUR) 60 MG 24 hr tablet Take 1 tablet (60 mg total) by mouth daily. 30 tablet 3  . ketotifen (ZADITOR) 0.025 % ophthalmic solution Place 1 drop into both eyes daily.    [name] KLOR-CON M20 20 MEQ tablet TAKE 1 TABLET EVERY DAY 90 tablet 2  . metolazone (ZAROXOLYN) 5 MG tablet Take 1 tablet (5 mg total) by mouth as needed. Take only as directed by Heart Failure Clinic (Patient taking differently: Take 5 mg by mouth See admin instructions. Take 1 tablet (5 mg) by mouth when directed by Heart Failure Clinic) 5 tablet 0  . Multiple Vitamin (MULTIVITAMIN WITH MINERALS) TABS tablet Take 1 tablet by mouth daily.    . nitroGLYCERIN (NITROSTAT) 0.4 MG SL tablet Place 1 tablet (0.4 mg total) under the tongue every 5 (five) minutes as needed for chest pain. 25 tablet 3  . OXYGEN  Inhale 2 L into the lungs See admin instructions. During waking hours as needed for low oxygen levels & at bedtime each night.    . pantoprazole (PROTONIX) 40 MG tablet Take 1 tablet (40 mg total) by mouth daily. 90 tablet 3  . rosuvastatin (CRESTOR) 20 MG tablet TAKE 1 TABLET EVERY DAY (Patient taking differently: TAKE 1 TABLET (20 MG) BY MOUTH DAILY AT BEDTIME) 90 tablet 3  . spironolactone (ALDACTONE) 25 MG tablet TAKE 1 TABLET EVERY DAY (Patient taking differently: TAKE 1 TABLET (25 MG) BY MOUTH DAILY AT BEDTIME) 90 tablet 3  . torsemide (DEMADEX) 20 MG tablet Take 2 tablets (40 mg total) by mouth daily. (Patient taking differently: Take 40 mg by mouth See admin instructions. Take 2 tablets (40 mg) by mouth every morning, take 1 tablet (20 mg) in the evening as needed for weight gain of 2 lbs in 24 hours) 180 tablet 3  . triamcinolone cream (KENALOG) 0.1 % Apply 1 application topically 2 (two) times daily as needed (itching).     . VENTOLIN HFA 108 (90 Base) MCG/ACT inhaler INHALE 2 PUFFS EVERY 4 HOURS AS NEEDED FOR WHEEZING 18 g 3   No current facility-administered medications for this visit.     Allergies  Allergen Reactions  . Ivp Dye [Iodinated Diagnostic Agents] Shortness Of Breath    No reaction to PO contrast with non-ionic dye.06-25-2014/rsm  . Shellfish-Derived Products Anaphylaxis  . Sulfa Antibiotics Shortness Of Breath  . Uloric [Febuxostat] Nausea And Vomiting  . Iodine Hives  . Atorvastatin Other (See Comments)    Pt states "causes bilateral leg pain/cramps."  . Cephalexin Itching and Rash  . Zithromax [Azithromycin] Rash    Past Medical History:  Diagnosis Date  . AICD (automatic cardioverter/defibrillator) present   . Anemia   . Arthritis   . Asthma   . CAD (coronary artery disease)    a. 10/09/16 LHC: SVG->LAD patent, SVG->Diag patent, SVG->RCA patent, SVG->LCx occluded. EF 60%, b. 10/31/16 LHC DES to AV groove Circ, DES to intermed branch  . Cellulitis and abscess  of foot 12/2014   RT FOOT  . CHF (congestive heart failure) ([place])   . Chronic bronchitis ([place])   . Chronic lower back pain   . Diverticulosis   . Facial numbness 02/2016  . Fatty liver disease, nonalcoholic   . Gastritis   . GERD (gastroesophageal reflux disease)   . Gout   . H/O hiatal hernia   . High cholesterol   . Hyperplastic colon polyp   . Hypertension   . IBS (irritable bowel syndrome)   . Internal  hemorrhoids   . Ischemic cardiomyopathy   . Obesity   . On home oxygen therapy    "2L at night" (10/31/2016)  . OSA (obstructive sleep apnea)    "can't tolerate a mask" (10/30/2016)  . Pneumonia    "couple times" (10/31/2016)  . Shortness of breath   . TIA (transient ischemic attack)    "recently" (10/31/2016)  . Type II diabetes mellitus ([place])     Social History   Socioeconomic History  . Marital status: Widowed    Spouse name: Not on file  . Number of children: 5  . Years of education: Not on file  . Highest education level: Not on file  Social Needs  . Financial resource strain: Very hard  . Food insecurity - worry: Often true  . Food insecurity - inability: Often true  . Transportation needs - medical: Not on file  . Transportation needs - non-medical: Not on file  Occupational History  . Occupation: STAFF/BUFFET    Employer: [employer]  Tobacco Use  . Smoking status: Former Smoker    Packs/day: 0.25    Years: 3.00    Pack years: 0.75    Types: Cigarettes    Last attempt to quit: 02/11/1968    Years since quitting: 49.0  . Smokeless tobacco: Never Used  Substance and Sexual Activity  . Alcohol use: No  . Drug use: No  . Sexual activity: No  Other Topics Concern  . Not on file  Social History Narrative   Widowed last year. She lives with her two sons.     Family History  Problem Relation Age of Onset  . Breast cancer Mother   . Diabetes Mother   . Heart disease Maternal Grandmother   . Kidney disease Maternal Grandmother   .  Diabetes Maternal Grandmother   . Glaucoma Maternal Aunt   . Heart disease Maternal Aunt   . Asthma Sister      Review of Systems: General: negative for chills, fever, night sweats or weight changes.  Cardiovascular: negative for chest pain, dyspnea on exertion, edema, orthopnea, palpitations, paroxysmal nocturnal dyspnea or shortness of breath Dermatological: negative for rash Respiratory: negative for cough or wheezing Urologic: negative for hematuria Abdominal: negative for nausea, vomiting, diarrhea, bright red blood per rectum, melena, or hematemesis Neurologic: negative for visual changes, syncope, or dizziness All other systems reviewed and are otherwise negative except as noted above.    Blood pressure 131/78, pulse 67, height 5\' 4"  (1.626 m), weight 262 lb (118.8 kg), SpO2 95 %.  General appearance: alert, cooperative, no distress and morbidly obese Neck: no carotid bruit and no JVD Lungs: clear to auscultation bilaterally Heart: regular rate and rhythm Extremities: no edema Skin: Skin color, texture, turgor normal. No rashes or lesions Neurologic: Grossly normal   ASSESSMENT AND PLAN:   Chest pain with moderate risk of acute coronary syndrome Pt was seen in [facility] ED at [facility] 01/30/17 after she developed back-Lt chest pain while at cardiac rehab.   CAD S/P percutaneous coronary angioplasty PCI with DES 10/31/16 to OM and intermediate secondary to [place]. SVG-CFX/OM was occluded  Acute diastolic HF (heart failure) (HCC) Stable  Chronic diastolic heart failure (HCC) stable  Essential hypertension Controlled  Morbid (severe) obesity due to excess calories ([place]) ERV 22% by pfts 10/16/2016 c/w obesity effects   S/P CABG x 5 CABG x 15 Nov 2014. L-LAD, S-Dx, S-RCA, S-CFX-OM  OSA (obstructive sleep apnea) C-pap intol  Nonischemic cardiomyopathy ([place]) Pt responded to  BiV ICD- last EF 55-60% Sept 2017  History of TIA (transient ischemic attack) Suspected TIA Sept 2017-  no MRI done secondary to ICD, no CTA secondary to h/o contrast allergy  CKD (chronic kidney disease), stage III ([place]) Last SCr 1.4 01/30/17  Asthma 10/16/2016   PLAN  I suggested she increase her Imdur to 60 mg and gave her the OK to resume cardiac rehab. Her symptoms pre PCI were more related to dyspnea as opposed to chest pain. F/U in  3 months  Kerin Ransom PA-C 02/05/2017 11:16 AM

## 2017-02-05 NOTE — Assessment & Plan Note (Signed)
Suspected TIA Sept 2017- no MRI done secondary to ICD, no CTA secondary to h/o contrast allergy

## 2017-02-05 NOTE — Assessment & Plan Note (Signed)
C-pap intol 

## 2017-02-05 NOTE — Assessment & Plan Note (Signed)
CABG x 15 Nov 2014. L-LAD, S-Dx, S-RCA, S-CFX-OM

## 2017-02-05 NOTE — Assessment & Plan Note (Signed)
Pt responded to BiV ICD- last EF 55-60% Sept 2017

## 2017-02-05 NOTE — Assessment & Plan Note (Signed)
ERV 22% by pfts 10/16/2016 c/w obesity effects

## 2017-02-05 NOTE — Assessment & Plan Note (Signed)
stable °

## 2017-02-05 NOTE — Assessment & Plan Note (Signed)
Last SCr 1.4 01/30/17

## 2017-02-05 NOTE — Progress Notes (Signed)
Cardiac Individual Treatment Plan  Patient Details  Name: DEARDRA HINKLEY MRN: [id number] Date of Birth: 07-22-1945 Referring Provider:     CARDIAC REHAB PHASE II ORIENTATION from 12/18/2016 in [facility]  Referring Provider  Croitoru, Dani Gobble MD, and Glori Bickers MD      Initial Encounter Date:    CARDIAC REHAB PHASE II ORIENTATION from 12/18/2016 in [facility]  Date  12/18/16  Referring Provider  Croitoru, [name] MD, and Glori Bickers MD      Visit Diagnosis: Status post coronary artery stent placement 10/31/16 S/P DES CFX  Patient's Home Medications on Admission:  Current Outpatient Medications:  .  acetaminophen (TYLENOL) 500 MG tablet, Take 1,000 mg by mouth every 6 (six) hours as needed (for pain.)., Disp: , Rfl:  .  albuterol (PROVENTIL) (2.5 MG/3ML) 0.083% nebulizer solution, Take 2.5 mg by nebulization every 6 (six) hours as needed for wheezing or shortness of breath., Disp: , Rfl:  .  aspirin EC 81 MG tablet, Take 1 tablet (81 mg total) by mouth daily., Disp: 90 tablet, Rfl: 3 .  bisoprolol (ZEBETA) 5 MG tablet, Take 1 tablet (5 mg total) by mouth daily., Disp: 90 tablet, Rfl: 3 .  budesonide-formoterol (SYMBICORT) 160-4.5 MCG/ACT inhaler, Inhale 2 puffs into the lungs 2 (two) times daily., Disp: , Rfl:  .  cetirizine (ZYRTEC) 10 MG tablet, Take 10 mg by mouth daily. , Disp: , Rfl:  .  clopidogrel (PLAVIX) 75 MG tablet, TAKE 1 TABLET EVERY DAY, Disp: 90 tablet, Rfl: 2 .  colchicine 0.6 MG tablet, Take one tablet two times daily as needed. (Patient taking differently: Take 0.6 mg by mouth 2 (two) times daily as needed (gout flare). ), Disp: 30 tablet, Rfl: 0 .  docusate sodium (COLACE) 100 MG capsule, Take 200 mg by mouth at bedtime. , Disp: , Rfl:  .  EPINEPHrine (EPIPEN 2-PAK) 0.3 mg/0.3 mL IJ SOAJ injection, Inject 0.3 mg into the muscle once as needed (severe allergic reaction)., Disp: , Rfl:  .   fluticasone (FLONASE) 50 MCG/ACT nasal spray, Place 1 spray into both nostrils at bedtime as needed for rhinitis or allergies. , Disp: , Rfl:  .  GuaiFENesin (MUCINEX PO), Take 1 tablet by mouth at bedtime., Disp: , Rfl:  .  hydroxypropyl methylcellulose / hypromellose (ISOPTO TEARS / GONIOVISC) 2.5 % ophthalmic solution, Place 1 drop into both eyes 4 (four) times daily as needed for dry eyes. , Disp: , Rfl:  .  ipratropium (ATROVENT) 0.06 % nasal spray, 1-2 pffs up to 4 x daily as needed, Disp: 15 mL, Rfl: 12 .  isosorbide mononitrate (IMDUR) 60 MG 24 hr tablet, Take 1 tablet (60 mg total) by mouth daily., Disp: 30 tablet, Rfl: 3 .  ketotifen (ZADITOR) 0.025 % ophthalmic solution, Place 1 drop into both eyes daily., Disp: , Rfl:  .  KLOR-CON M20 20 MEQ tablet, TAKE 1 TABLET EVERY DAY, Disp: 90 tablet, Rfl: 2 .  metolazone (ZAROXOLYN) 5 MG tablet, Take 1 tablet (5 mg total) by mouth as needed. Take only as directed by [facility] (Patient taking differently: Take 5 mg by mouth See admin instructions. Take 1 tablet (5 mg) by mouth when directed by [facility]), Disp: 5 tablet, Rfl: 0 .  Multiple Vitamin (MULTIVITAMIN WITH MINERALS) TABS tablet, Take 1 tablet by mouth daily., Disp: , Rfl:  .  nitroGLYCERIN (NITROSTAT) 0.4 MG SL tablet, Place 1 tablet (0.4 mg total) under  the tongue every 5 (five) minutes as needed for chest pain., Disp: 25 tablet, Rfl: 3 .  OXYGEN, Inhale 2 L into the lungs See admin instructions. During waking hours as needed for low oxygen levels & at bedtime each night., Disp: , Rfl:  .  pantoprazole (PROTONIX) 40 MG tablet, Take 1 tablet (40 mg total) by mouth daily., Disp: 90 tablet, Rfl: 3 .  rosuvastatin (CRESTOR) 20 MG tablet, TAKE 1 TABLET EVERY DAY (Patient taking differently: TAKE 1 TABLET (20 MG) BY MOUTH DAILY AT BEDTIME), Disp: 90 tablet, Rfl: 3 .  spironolactone (ALDACTONE) 25 MG tablet, TAKE 1 TABLET EVERY DAY (Patient taking differently: TAKE 1 TABLET  (25 MG) BY MOUTH DAILY AT BEDTIME), Disp: 90 tablet, Rfl: 3 .  torsemide (DEMADEX) 20 MG tablet, Take 2 tablets (40 mg total) by mouth daily. (Patient taking differently: Take 40 mg by mouth See admin instructions. Take 2 tablets (40 mg) by mouth every morning, take 1 tablet (20 mg) in the evening as needed for weight gain of 2 lbs in 24 hours), Disp: 180 tablet, Rfl: 3 .  triamcinolone cream (KENALOG) 0.1 %, Apply 1 application topically 2 (two) times daily as needed (itching). , Disp: , Rfl:  .  VENTOLIN HFA 108 (90 Base) MCG/ACT inhaler, INHALE 2 PUFFS EVERY 4 HOURS AS NEEDED FOR WHEEZING, Disp: 18 g, Rfl: 3  Past Medical History: Past Medical History:  Diagnosis Date  . AICD (automatic cardioverter/defibrillator) present   . Anemia   . Arthritis   . Asthma   . CAD (coronary artery disease)    a. 10/09/16 LHC: SVG->LAD patent, SVG->Diag patent, SVG->RCA patent, SVG->LCx occluded. EF 60%, b. 10/31/16 LHC DES to AV groove Circ, DES to intermed branch  . Cellulitis and abscess of foot 12/2014   RT FOOT  . CHF (congestive heart failure) ([place])   . Chronic bronchitis ([place])   . Chronic lower back pain   . Diverticulosis   . Facial numbness 02/2016  . Fatty liver disease, nonalcoholic   . Gastritis   . GERD (gastroesophageal reflux disease)   . Gout   . H/O hiatal hernia   . High cholesterol   . Hyperplastic colon polyp   . Hypertension   . IBS (irritable bowel syndrome)   . Internal hemorrhoids   . Ischemic cardiomyopathy   . Obesity   . On home oxygen therapy    "2L at night" (10/31/2016)  . OSA (obstructive sleep apnea)    "can't tolerate a mask" (10/30/2016)  . Pneumonia    "couple times" (10/31/2016)  . Shortness of breath   . TIA (transient ischemic attack)    "recently" (10/31/2016)  . Type II diabetes mellitus (H[facility])     Tobacco Use: Social History   Tobacco Use  Smoking Status Former Smoker  . Packs/day: 0.25  . Years: 3.00  . Pack years: 0.75  . Types: Cigarettes    . Last attempt to quit: 02/11/1968  . Years since quitting: 49.0  Smokeless Tobacco Never Used    Labs: Recent Chemical engineer    Labs for ITP Cardiac and Pulmonary Rehab Latest Ref Rng & Units 03/04/2016 10/09/2016 10/09/2016 10/30/2016 10/31/2016   Cholestrol 0 - 200 mg/dL - - - - 166   LDLCALC 0 - 99 mg/dL - - - - 91   HDL >40 mg/dL - - - - 39(L)   Trlycerides <150 mg/dL - - - - 181(H)   Hemoglobin A1c 4.8 - 5.6 % - - -  6.9(H) -   PHART 7.350 - 7.450 - 7.338(L) - - -   PCO2ART 32.0 - 48.0 mmHg - 57.7(H) - - -   HCO3 20.0 - 28.0 mmol/L - 31.0(H) 29.1(H) - -   TCO2 22 - 32 mmol/L 31 33(H) 31 - -   ACIDBASEDEF 0.0 - 2.0 mmol/L - - - - -   O2SAT % - 95.0 65.0 - -      Capillary Blood Glucose: Lab Results  Component Value Date   GLUCAP 116 (H) 01/07/2017   GLUCAP 108 (H) 01/05/2017   GLUCAP 128 (H) 12/29/2016   GLUCAP 110 (H) 12/29/2016   GLUCAP 121 (H) 12/24/2016     Exercise Target Goals:    Exercise Program Goal: Individual exercise prescription set with THRR, safety & activity barriers. Participant demonstrates ability to understand and report RPE using BORG scale, to self-measure pulse accurately, and to acknowledge the importance of the exercise prescription.  Exercise Prescription Goal: Starting with aerobic activity 30 plus minutes a day, 3 days per week for initial exercise prescription. Provide home exercise prescription and guidelines that participant acknowledges understanding prior to discharge.  Activity Barriers & Risk Stratification: Activity Barriers & Cardiac Risk Stratification - 12/18/16 0925      Activity Barriers & Cardiac Risk Stratification   Activity Barriers  Joint Problems;Muscular Weakness;Shortness of Breath;Balance Concerns;Other (comment)    Comments  L broken shoulder, hx of L broken foot and ankle    Cardiac Risk Stratification  High       6 Minute Walk: 6 Minute Walk    Row Name 12/18/16 1242         6 Minute Walk   Phase   Initial     Distance  716 feet     Walk Time  3.25 minutes     # of Rest Breaks  3     MPH  2.5     METS  0.78     RPE  13     Perceived Dyspnea   3     Symptoms  Yes (comment)     Comments  fatigue/SOB. Pt took 3 rest breaks for a total of 2 minutes and 35 seconds     Resting HR  68 bpm     Resting BP  144/82     Resting Oxygen Saturation   96 %     Exercise Oxygen Saturation  during 6 min walk  94 %     Max Ex. HR  85 bpm     Max Ex. BP  164/84     2 Minute Post BP  122/72        Oxygen Initial Assessment:   Oxygen Re-Evaluation:   Oxygen Discharge (Final Oxygen Re-Evaluation):   Initial Exercise Prescription: Initial Exercise Prescription - 12/18/16 1200      Date of Initial Exercise RX and Referring Provider   Date  12/18/16    Referring Provider  Croitoru, [name] MD, and Bensimhon, Daniel MD      Recumbant Bike   Level  --    Minutes  --    METs  --      NuStep   Level  1    SPM  50    Minutes  15    METs  1      Arm Ergometer   Level  1    Minutes  15    METs  1      Prescription Details   Frequency (  times per week)  3    Duration  Progress to 30 minutes of continuous aerobic without signs/symptoms of physical distress      Intensity   THRR 40-80% of Max Heartrate  59-118    Ratings of Perceived Exertion  11-15    Perceived Dyspnea  0-4      Progression   Progression  Continue to progress workloads to maintain intensity without signs/symptoms of physical distress.      Resistance Training   Training Prescription  Yes    Weight  1lb    Reps  10-15       Perform Capillary Blood Glucose checks as needed.  Exercise Prescription Changes:  Exercise Prescription Changes    Row Name 12/24/16 1528 01/14/17 1602 01/26/17 1556         Response to Exercise   Blood Pressure (Admit)  117/72  118/78  128/82     Blood Pressure (Exercise)  120/80  126/74  136/84     Blood Pressure (Exit)  114/70  106/80  126/80     Heart Rate (Admit)  67 bpm  72  bpm  65 bpm     Heart Rate (Exercise)  78 bpm  73 bpm  81 bpm     Heart Rate (Exit)  63 bpm  71 bpm  65 bpm     Rating of Perceived Exertion (Exercise)  13  11  11      Symptoms  none  L leg pain  none     Comments  pt oriented to exercise equipment today  weight is elevated from previous session  -     Duration  Progress to 30 minutes of  aerobic without signs/symptoms of physical distress  Progress to 30 minutes of  aerobic without signs/symptoms of physical distress  Progress to 30 minutes of  aerobic without signs/symptoms of physical distress     Intensity  THRR unchanged  THRR unchanged  THRR unchanged       Progression   Progression  Continue to progress workloads to maintain intensity without signs/symptoms of physical distress.  Continue to progress workloads to maintain intensity without signs/symptoms of physical distress.  Continue to progress workloads to maintain intensity without signs/symptoms of physical distress.     Average METs  1.6  1.5  1.9       Resistance Training   Training Prescription  No relaxation day  No relaxation day  No       NuStep   Level  1  1  2      SPM  70  70  70     Minutes  20  25  15      METs  1.6  1.5  1.8       Arm Ergometer   Level  -  -  1     Minutes  -  -  5     METs  -  -  2.12       Home Exercise Plan   Plans to continue exercise at  -  Home (comment) walking and chair exercises  Home (comment) walking and seated chair exercises     Frequency  -  Add 2 additional days to program exercise sessions.  Add 2 additional days to program exercise sessions.     Initial Home Exercises Provided  -  01/12/17  01/12/17        Exercise Comments:  Exercise Comments    Row Name 12/24/16 1620 12/25/16 1535  01/06/17 1619 01/27/17 1603     Exercise Comments  Pt oriented to exercise equipment today. Pt responded well to first exercise session.  Pt oriented to exercise equipment today. Pt responded well to first exercise session.  Pt oriented to  exercise equipment today. Pt responded well to first exercise session.  Reviewed METs and goals. Pt is tolerating exercise fairly well, but is limited by L leg and L shoulder pain. Pt understands exercise limitations and is able to exercise and move with restrictions.        Exercise Goals and Review:  Exercise Goals    Row Name 12/18/16 0926             Exercise Goals   Increase Physical Activity  Yes       Intervention  Provide advice, education, support and counseling about physical activity/exercise needs.;Develop an individualized exercise prescription for aerobic and resistive training based on initial evaluation findings, risk stratification, comorbidities and participant's personal goals.       Expected Outcomes  Achievement of increased cardiorespiratory fitness and enhanced flexibility, muscular endurance and strength shown through measurements of functional capacity and personal statement of participant.       Increase Strength and Stamina  Yes       Intervention  Provide advice, education, support and counseling about physical activity/exercise needs.;Develop an individualized exercise prescription for aerobic and resistive training based on initial evaluation findings, risk stratification, comorbidities and participant's personal goals.       Expected Outcomes  Achievement of increased cardiorespiratory fitness and enhanced flexibility, muscular endurance and strength shown through measurements of functional capacity and personal statement of participant.       Able to understand and use rate of perceived exertion (RPE) scale  Yes       Intervention  Provide education and explanation on how to use RPE scale       Expected Outcomes  Short Term: Able to use RPE daily in rehab to express subjective intensity level;Long Term:  Able to use RPE to guide intensity level when exercising independently       Able to understand and use Dyspnea scale  Yes       Intervention  Provide  education and explanation on how to use Dyspnea scale       Expected Outcomes  Short Term: Able to use Dyspnea scale daily in rehab to express subjective sense of shortness of breath during exertion;Long Term: Able to use Dyspnea scale to guide intensity level when exercising independently       Knowledge and understanding of Target Heart Rate Range (THRR)  Yes       Intervention  Provide education and explanation of THRR including how the numbers were predicted and where they are located for reference       Expected Outcomes  Short Term: Able to state/look up THRR;Long Term: Able to use THRR to govern intensity when exercising independently;Short Term: Able to use daily as guideline for intensity in rehab       Able to check pulse independently  Yes       Intervention  Provide education and demonstration on how to check pulse in carotid and radial arteries.;Review the importance of being able to check your own pulse for safety during independent exercise       Expected Outcomes  Short Term: Able to explain why pulse checking is important during independent exercise;Long Term: Able to check pulse independently and accurately  Understanding of Exercise Prescription  Yes       Intervention  Provide education, explanation, and written materials on patient's individual exercise prescription       Expected Outcomes  Short Term: Able to explain program exercise prescription;Long Term: Able to explain home exercise prescription to exercise independently          Exercise Goals Re-Evaluation : Exercise Goals Re-Evaluation    Row Name 01/06/17 1619 01/27/17 1605           Exercise Goal Re-Evaluation   Exercise Goals Review  Able to understand and use rate of perceived exertion (RPE) scale;Understanding of Exercise Prescription  Increase Physical Activity;Able to understand and use rate of perceived exertion (RPE) scale;Knowledge and understanding of Target Heart Rate Range (THRR);Understanding of  Exercise Prescription;Increase Strength and Stamina;Able to check pulse independently      Comments  Pt has a great understanding of ExRx and demonstrates proper use of RPE scale with activity.  Pt is active with chores at home and is compliant with chair exercises. Pt enjoys coming to cardiac rehab and is able to exercise though limited by joint pain      Expected Outcomes  Pt will continue to improve in cardiorespiratory fitness.   Pt will continue to improve in cardiorespiratory fitness, functional mobility and overall health          Discharge Exercise Prescription (Final Exercise Prescription Changes): Exercise Prescription Changes - 01/26/17 1556      Response to Exercise   Blood Pressure (Admit)  128/82    Blood Pressure (Exercise)  136/84    Blood Pressure (Exit)  126/80    Heart Rate (Admit)  65 bpm    Heart Rate (Exercise)  81 bpm    Heart Rate (Exit)  65 bpm    Rating of Perceived Exertion (Exercise)  11    Symptoms  none    Duration  Progress to 30 minutes of  aerobic without signs/symptoms of physical distress    Intensity  THRR unchanged      Progression   Progression  Continue to progress workloads to maintain intensity without signs/symptoms of physical distress.    Average METs  1.9      Resistance Training   Training Prescription  No      NuStep   Level  2    SPM  70    Minutes  15    METs  1.8      Arm Ergometer   Level  1    Minutes  5    METs  2.12      Home Exercise Plan   Plans to continue exercise at  Home (comment) walking and seated chair exercises    Frequency  Add 2 additional days to program exercise sessions.    Initial Home Exercises Provided  01/12/17       Nutrition:  Target Goals: Understanding of nutrition guidelines, daily intake of sodium 1500mg , cholesterol 200mg , calories 30% from fat and 7% or less from saturated fats, daily to have 5 or more servings of fruits and vegetables.  Biometrics: Pre Biometrics - 12/18/16 1300        Pre Biometrics   Height  5' 2.25" (1.581 m)    Weight  257 lb 8 oz (116.8 kg)    Waist Circumference  53 inches    Hip Circumference  52 inches    Waist to Hip Ratio  1.02 %    BMI (Calculated)  46.73  Triceps Skinfold  63 mm    % Body Fat  61.2 %    Grip Strength  28 kg    Single Leg Stand  1.23 seconds        Nutrition Therapy Plan and Nutrition Goals: Nutrition Therapy & Goals - 12/19/16 1331      Nutrition Therapy   Diet  Carb Modified, Therapeutic Lifestyle Changes      Personal Nutrition Goals   Nutrition Goal  Pt to identify food quantities necessary to achieve weight loss of 6-24 lb (2.7-10.9 kg) at graduation from cardiac rehab. Goal wt of 233-251 lb desired.       Intervention Plan   Intervention  Prescribe, educate and counsel regarding individualized specific dietary modifications aiming towards targeted core components such as weight, hypertension, lipid management, diabetes, heart failure and other comorbidities.    Expected Outcomes  Short Term Goal: Understand basic principles of dietary content, such as calories, fat, sodium, cholesterol and nutrients.;Long Term Goal: Adherence to prescribed nutrition plan.       Nutrition Discharge: Nutrition Scores: Nutrition Assessments - 12/19/16 1332      MEDFICTS Scores   Pre Score  34       Nutrition Goals Re-Evaluation:   Nutrition Goals Re-Evaluation:   Nutrition Goals Discharge (Final Nutrition Goals Re-Evaluation):   Psychosocial: Target Goals: Acknowledge presence or absence of significant depression and/or stress, maximize coping skills, provide positive support system. Participant is able to verbalize types and ability to use techniques and skills needed for reducing stress and depression.  Initial Review & Psychosocial Screening: Initial Psych Review & Screening - 12/18/16 1204      Initial Review   Current issues with  Current Stress Concerns    Source of Stress Concerns  Chronic  Illness;Family;Financial    Comments  Sammi is taking care of a son and has little financial resources other than her [place]?  Yes    Concerns  Recent loss of significant other    Comments  Patient husband passed away 2 years ago      Barriers   Psychosocial barriers to participate in program  The patient should benefit from training in stress management and relaxation.      Screening Interventions   Interventions  Encouraged to exercise;To provide support and resources with identified psychosocial needs;Provide feedback about the scores to participant       Quality of Life Scores: Quality of Life - 12/18/16 1308      Quality of Life Scores   Health/Function Pre  14.57 % Annaly is dissatisfied with her health due to her CAD, and diabetes and shortness of breath.    Socioeconomic Pre  25 %    Psych/Spiritual Pre  25.71 %    Family Pre  25.5 %    GLOBAL Pre  20.52 % Kamilah hopes coming to cardiac rehab will help her with her overall health       PHQ-9: Recent Review Flowsheet Data    Depression screen [facility] 2/9 12/24/2016 12/18/2016 06/16/2016 11/30/2015 03/12/2014   Decreased Interest 0 0 0 0 0   Down, Depressed, Hopeless 0 1  0 0 1   PHQ - 2 Score 0 1 0 0 1     Interpretation of Total Score  Total Score Depression Severity:  1-4 = Minimal depression, 5-9 = Mild depression, 10-14 = Moderate depression, 15-19 = Moderately severe depression, 20-27 = Severe depression  Psychosocial Evaluation and Intervention:   Psychosocial Re-Evaluation: Psychosocial Re-Evaluation    [place] Name 01/09/17 1524 02/05/17 1424           Psychosocial Re-Evaluation   Current issues with  Current Stress Concerns  Current Stress Concerns      Comments  Genevive continues to have stress regarding her family dynamics  Lajeana continues to have stress regarding her family dynamics      Interventions  Stress management education  Stress management  education;Encouraged to attend Cardiac Rehabilitation for the exercise      Continue Psychosocial Services   Follow up required by staff  -      Comments  Roiza is taking care of a son and has little financial resources other than her social security  Keriann is taking care of a son and has little financial resources other than her social security        Initial Review   Source of Stress Concerns  Chronic Illness;Family;Financial  Chronic Illness;Family;Financial         Psychosocial Discharge (Final Psychosocial Re-Evaluation): Psychosocial Re-Evaluation - 02/05/17 1424      Psychosocial Re-Evaluation   Current issues with  Current Stress Concerns    Comments  Melenda continues to have stress regarding her family dynamics    Interventions  Stress management education;Encouraged to attend Cardiac Rehabilitation for the exercise    Comments  Natesha is taking care of a son and has little financial resources other than her social security      Initial Review   Source of Stress Concerns  Chronic Illness;Family;Financial       Vocational Rehabilitation: Provide vocational rehab assistance to qualifying candidates.   Vocational Rehab Evaluation & Intervention: Vocational Rehab - 12/18/16 1203      Initial Vocational Rehab Evaluation & Intervention   Assessment shows need for Vocational Rehabilitation  Yes Mrs Wigley said he would like to work if possible       Education: Education Goals: Education classes will be provided on a weekly basis, covering required topics. Participant will state understanding/return demonstration of topics presented.  Learning Barriers/Preferences: Learning Barriers/Preferences - 12/18/16 1150      Learning Barriers/Preferences   Learning Barriers  Sight    Learning Preferences  Written Material;Individual Instruction;Skilled Demonstration       Education Topics: Count Your Pulse:  -Group instruction provided by verbal instruction,  demonstration, patient participation and written materials to support subject.  Instructors address importance of being able to find your pulse and how to count your pulse when at home without a heart monitor.  Patients get hands on experience counting their pulse with staff help and individually.   Heart Attack, Angina, and Risk Factor Modification:  -Group instruction provided by verbal instruction, video, and written materials to support subject.  Instructors address signs and symptoms of angina and heart attacks.    Also discuss risk factors for heart disease and how to make changes to improve heart health risk factors.   Functional Fitness:  -Group instruction provided by verbal instruction, demonstration, patient participation, and written materials to support subject.  Instructors address safety measures for doing things around the house.  Discuss how to get up and down off the floor, how to pick things up properly, how to safely get out of a chair without assistance, and balance training.   Meditation and Mindfulness:  -Group instruction provided by verbal instruction, patient participation, and written materials to support subject.  Instructor addresses importance of mindfulness and  meditation practice to help reduce stress and improve awareness.  Instructor also leads participants through a meditation exercise.    CARDIAC REHAB PHASE II EXERCISE from 01/28/2017 in [place]  Date  01/14/17  Instruction Review Code  2- meets goals/outcomes      Stretching for Flexibility and Mobility:  -Group instruction provided by verbal instruction, patient participation, and written materials to support subject.  Instructors lead participants through series of stretches that are designed to increase flexibility thus improving mobility.  These stretches are additional exercise for major muscle groups that are typically performed during regular warm up and cool  down.   Hands Only CPR:  -Group verbal, video, and participation provides a basic overview of AHA guidelines for community CPR. Role-play of emergencies allow participants the opportunity to practice calling for help and chest compression technique with discussion of AED use.   Hypertension: -Group verbal and written instruction that provides a basic overview of hypertension including the most recent diagnostic guidelines, risk factor reduction with self-care instructions and medication management.    Nutrition I class: Heart Healthy Eating:  -Group instruction provided by PowerPoint slides, verbal discussion, and written materials to support subject matter. The instructor gives an explanation and review of the Therapeutic Lifestyle Changes diet recommendations, which includes a discussion on lipid goals, dietary fat, sodium, fiber, plant stanol/sterol esters, sugar, and the components of a well-balanced, healthy diet.   CARDIAC REHAB PHASE II EXERCISE from 01/28/2017 in [place]  Date  01/14/17 [place] handouts given. ]  Educator  RD  Instruction Review Code  Not applicable      Nutrition II class: Lifestyle Skills:  -Group instruction provided by PowerPoint slides, verbal discussion, and written materials to support subject matter. The instructor gives an explanation and review of label reading, grocery shopping for heart health, heart healthy recipe modifications, and ways to make healthier choices when eating out.   CARDIAC REHAB PHASE II EXERCISE from 01/28/2017 in [place]  Date  01/14/17 [place] handouts given.]  Educator  RD  Instruction Review Code  Not applicable      Diabetes Question & Answer:  -Group instruction provided by PowerPoint slides, verbal discussion, and written materials to support subject matter. The instructor gives an explanation and review of diabetes co-morbidities, pre- and post-prandial  blood glucose goals, pre-exercise blood glucose goals, signs, symptoms, and treatment of hypoglycemia and hyperglycemia, and foot care basics.   Diabetes Blitz:  -Group instruction provided by PowerPoint slides, verbal discussion, and written materials to support subject matter. The instructor gives an explanation and review of the physiology behind type 1 and type 2 diabetes, diabetes medications and rational behind using different medications, pre- and post-prandial blood glucose recommendations and Hemoglobin A1c goals, diabetes diet, and exercise including blood glucose guidelines for exercising safely.    CARDIAC REHAB PHASE II EXERCISE from 01/28/2017 in [place]  Date  01/14/17 [place] handouts given.]  Educator  RD  Instruction Review Code  Not applicable      Portion Distortion:  -Group instruction provided by PowerPoint slides, verbal discussion, written materials, and food models to support subject matter. The instructor gives an explanation of serving size versus portion size, changes in portions sizes over the last 20 years, and what consists of a serving from each food group.   CARDIAC REHAB PHASE II EXERCISE from 01/28/2017 in [place]  Date  01/28/17  Educator  RD  Instruction Review Code  2- meets goals/outcomes      Stress Management:  -Group instruction provided by verbal instruction, video, and written materials to support subject matter.  Instructors review role of stress in heart disease and how to cope with stress positively.     Exercising on Your Own:  -Group instruction provided by verbal instruction, power point, and written materials to support subject.  Instructors discuss benefits of exercise, components of exercise, frequency and intensity of exercise, and end points for exercise.  Also discuss use of nitroglycerin and activating EMS.  Review options of places to exercise outside of rehab.   Review guidelines for sex with heart disease.   Cardiac Drugs I:  -Group instruction provided by verbal instruction and written materials to support subject.  Instructor reviews cardiac drug classes: antiplatelets, anticoagulants, beta blockers, and statins.  Instructor discusses reasons, side effects, and lifestyle considerations for each drug class.   CARDIAC REHAB PHASE II EXERCISE from 01/28/2017 in [place]  Date  12/24/16  Instruction Review Code  2- meets goals/outcomes      Cardiac Drugs II:  -Group instruction provided by verbal instruction and written materials to support subject.  Instructor reviews cardiac drug classes: angiotensin converting enzyme inhibitors (ACE-I), angiotensin II receptor blockers (ARBs), nitrates, and calcium channel blockers.  Instructor discusses reasons, side effects, and lifestyle considerations for each drug class.   Anatomy and Physiology of the Circulatory System:  Group verbal and written instruction and models provide basic cardiac anatomy and physiology, with the coronary electrical and arterial systems. Review of: AMI, Angina, Valve disease, Heart Failure, Peripheral Artery Disease, Cardiac Arrhythmia, Pacemakers, and the ICD.   CARDIAC REHAB PHASE II EXERCISE from 01/28/2017 in [place]  Date  01/07/17  Instruction Review Code  2- meets goals/outcomes      Other Education:  -Group or individual verbal, written, or video instructions that support the educational goals of the cardiac rehab program.   Knowledge Questionnaire Score: Knowledge Questionnaire Score - 12/18/16 1150      Knowledge Questionnaire Score   Pre Score  13/24       Core Components/Risk Factors/Patient Goals at Admission: Personal Goals and Risk Factors at Admission - 12/18/16 1302      Core Components/Risk Factors/Patient Goals on Admission    Weight Management  Yes;Obesity;Weight  Maintenance;Weight Loss    Intervention  Weight Management: Develop a combined nutrition and exercise program designed to reach desired caloric intake, while maintaining appropriate intake of nutrient and fiber, sodium and fats, and appropriate energy expenditure required for the weight goal.;Weight Management: Provide education and appropriate resources to help participant work on and attain dietary goals.;Weight Management/Obesity: Establish reasonable short term and long term weight goals.    Admit Weight  257 lb 8 oz (116.8 kg)    Expected Outcomes  Short Term: Continue to assess and modify interventions until short term weight is achieved;Long Term: Adherence to nutrition and physical activity/exercise program aimed toward attainment of established weight goal;Weight Maintenance: Understanding of the daily nutrition guidelines, which includes 25-35% calories from fat, 7% or less cal from saturated fats, less than 200mg  cholesterol, less than 1.5gm of sodium, & 5 or more servings of fruits and vegetables daily;Weight Loss: Understanding of general recommendations for a balanced deficit meal plan, which promotes 1-2 lb weight loss per week and includes a negative energy balance of [id number] kcal/d;Understanding recommendations for meals to include  15-35% energy as protein, 25-35% energy from fat, 35-60% energy from carbohydrates, less than 200mg  of dietary cholesterol, 20-35 gm of total fiber daily;Understanding of distribution of calorie intake throughout the day with the consumption of 4-5 meals/snacks    Improve shortness of breath with ADL's  Yes    Intervention  Provide education, individualized exercise plan and daily activity instruction to help decrease symptoms of SOB with activities of daily living.    Expected Outcomes  Short Term: Achieves a reduction of symptoms when performing activities of daily living.    Diabetes  Yes    Intervention  Provide education about signs/symptoms and action to  take for hypo/hyperglycemia.;Provide education about proper nutrition, including hydration, and aerobic/resistive exercise prescription along with prescribed medications to achieve blood glucose in normal ranges: Fasting glucose 65-99 mg/dL    Expected Outcomes  Short Term: Participant verbalizes understanding of the signs/symptoms and immediate care of hyper/hypoglycemia, proper foot care and importance of medication, aerobic/resistive exercise and nutrition plan for blood glucose control.;Long Term: Attainment of HbA1C < 7%.    Heart Failure  Yes    Intervention  Provide a combined exercise and nutrition program that is supplemented with education, support and counseling about heart failure. Directed toward relieving symptoms such as shortness of breath, decreased exercise tolerance, and extremity edema.    Expected Outcomes  Improve functional capacity of life;Short term: Attendance in program 2-3 days a week with increased exercise capacity. Reported lower sodium intake. Reported increased fruit and vegetable intake. Reports medication compliance.;Short term: Daily weights obtained and reported for increase. Utilizing diuretic protocols set by physician.;Long term: Adoption of self-care skills and reduction of barriers for early signs and symptoms recognition and intervention leading to self-care maintenance.    Hypertension  Yes    Intervention  Provide education on lifestyle modifcations including regular physical activity/exercise, weight management, moderate sodium restriction and increased consumption of fresh fruit, vegetables, and low fat dairy, alcohol moderation, and smoking cessation.;Monitor prescription use compliance.    Expected Outcomes  Short Term: Continued assessment and intervention until BP is < 140/78mm HG in hypertensive participants. < 130/47mm HG in hypertensive participants with diabetes, heart failure or chronic kidney disease.;Long Term: Maintenance of blood pressure at goal  levels.    Lipids  Yes    Intervention  Provide education and support for participant on nutrition & aerobic/resistive exercise along with prescribed medications to achieve LDL 70mg , HDL >40mg .    Expected Outcomes  Short Term: Participant states understanding of desired cholesterol values and is compliant with medications prescribed. Participant is following exercise prescription and nutrition guidelines.;Long Term: Cholesterol controlled with medications as prescribed, with individualized exercise RX and with personalized nutrition plan. Value goals: LDL < 70mg , HDL > 40 mg.    Stress  Yes    Intervention  Offer individual and/or small group education and counseling on adjustment to heart disease, stress management and health-related lifestyle change. Teach and support self-help strategies.;Refer participants experiencing significant psychosocial distress to appropriate mental health specialists for further evaluation and treatment. When possible, include family members and significant others in education/counseling sessions.    Expected Outcomes  Short Term: Participant demonstrates changes in health-related behavior, relaxation and other stress management skills, ability to obtain effective social support, and compliance with psychotropic medications if prescribed.;Long Term: Emotional wellbeing is indicated by absence of clinically significant psychosocial distress or social isolation.       Core Components/Risk Factors/Patient Goals Review:  Goals and Risk Factor Review  [place] Name 01/09/17 1452 01/09/17 1521 02/05/17 1424         Core Components/Risk Factors/Patient Goals Review   Personal Goals Review  Weight Management/Obesity;Diabetes;Hypertension;Lipids;Improve shortness of breath with ADL's;Heart Failure  -  Weight Management/Obesity;Diabetes;Hypertension;Lipids;Improve shortness of breath with ADL's;Heart Failure     Review  -  Madeline's attendance has been fair. Sherma's vital signs  and CBG's have been stable as Gloristine is diet controlled.  Inetha's attendance has been fair. Audreyana's vital signs and CBG's have been stable as Linden is diet controlled.     Expected Outcomes  -  Jennel will continue to come to exercise to help reduce her risk facotrs and take her medicaitons as presribed  Ilhan will continue to come to exercise to help reduce her risk facotrs and take her medicaitons as presribed        Core Components/Risk Factors/Patient Goals at Discharge (Final Review):  Goals and Risk Factor Review - 02/05/17 1424      Core Components/Risk Factors/Patient Goals Review   Personal Goals Review  Weight Management/Obesity;Diabetes;Hypertension;Lipids;Improve shortness of breath with ADL's;Heart Failure    Review  Trivia's attendance has been fair. Serra's vital signs and CBG's have been stable as Pearlean is diet controlled.    Expected Outcomes  Shannette will continue to come to exercise to help reduce her risk facotrs and take her medicaitons as presribed       ITP Comments: ITP Comments    [place] Name 12/18/16 0905 01/09/17 1449 01/09/17 1528 02/05/17 1423     ITP Comments  Dr. Fransico Him, Medical Director   30 day ITP review. Paitent has good partipation and attendance.   30 day ITP review. Paitent has good partipation and attendance.   30 day ITP review. Paitent has good partipation and attendance. Reign plans to return to exercise tomorrow per [facility]       Comments: See ITP comments.Barnet Pall, RN,BSN 02/06/2017 8:43 AM

## 2017-02-05 NOTE — Assessment & Plan Note (Signed)
Stable

## 2017-02-06 ENCOUNTER — Encounter (HOSPITAL_COMMUNITY): Payer: Medicare HMO

## 2017-02-09 ENCOUNTER — Encounter (HOSPITAL_COMMUNITY)
Admission: RE | Admit: 2017-02-09 | Discharge: 2017-02-09 | Disposition: A | Discharge status: Home / Self Care | Payer: Medicare HMO | Source: Ambulatory Visit | Attending: Cardiovascular Disease | Admitting: Cardiovascular Disease

## 2017-02-09 DIAGNOSIS — Z9981 Dependence on supplemental oxygen: Secondary | ICD-10-CM | POA: Diagnosis not present

## 2017-02-09 DIAGNOSIS — Z79899 Other long term (current) drug therapy: Secondary | ICD-10-CM | POA: Diagnosis not present

## 2017-02-09 DIAGNOSIS — M1990 Unspecified osteoarthritis, unspecified site: Secondary | ICD-10-CM | POA: Diagnosis not present

## 2017-02-09 DIAGNOSIS — Z7902 Long term (current) use of antithrombotics/antiplatelets: Secondary | ICD-10-CM | POA: Diagnosis not present

## 2017-02-09 DIAGNOSIS — Z7982 Long term (current) use of aspirin: Secondary | ICD-10-CM | POA: Diagnosis not present

## 2017-02-09 DIAGNOSIS — Z955 Presence of coronary angioplasty implant and graft: Secondary | ICD-10-CM

## 2017-02-09 DIAGNOSIS — I2510 Atherosclerotic heart disease of native coronary artery without angina pectoris: Secondary | ICD-10-CM | POA: Diagnosis not present

## 2017-02-09 DIAGNOSIS — Z95810 Presence of automatic (implantable) cardiac defibrillator: Secondary | ICD-10-CM | POA: Diagnosis not present

## 2017-02-09 DIAGNOSIS — Z7951 Long term (current) use of inhaled steroids: Secondary | ICD-10-CM | POA: Diagnosis not present

## 2017-02-11 ENCOUNTER — Encounter (HOSPITAL_COMMUNITY): Payer: Medicare HMO

## 2017-02-11 DIAGNOSIS — M84375D Stress fracture, left foot, subsequent encounter for fracture with routine healing: Secondary | ICD-10-CM | POA: Diagnosis not present

## 2017-02-11 DIAGNOSIS — M659 Synovitis and tenosynovitis, unspecified: Secondary | ICD-10-CM | POA: Diagnosis not present

## 2017-02-13 ENCOUNTER — Encounter (HOSPITAL_COMMUNITY)
Admission: RE | Admit: 2017-02-13 | Discharge: 2017-02-13 | Disposition: A | Discharge status: Home / Self Care | Payer: Medicare HMO | Source: Ambulatory Visit | Attending: Cardiovascular Disease | Admitting: Cardiovascular Disease

## 2017-02-13 DIAGNOSIS — Z87891 Personal history of nicotine dependence: Secondary | ICD-10-CM | POA: Insufficient documentation

## 2017-02-13 DIAGNOSIS — Z7951 Long term (current) use of inhaled steroids: Secondary | ICD-10-CM | POA: Insufficient documentation

## 2017-02-13 DIAGNOSIS — K219 Gastro-esophageal reflux disease without esophagitis: Secondary | ICD-10-CM | POA: Insufficient documentation

## 2017-02-13 DIAGNOSIS — K760 Fatty (change of) liver, not elsewhere classified: Secondary | ICD-10-CM | POA: Insufficient documentation

## 2017-02-13 DIAGNOSIS — Z7902 Long term (current) use of antithrombotics/antiplatelets: Secondary | ICD-10-CM | POA: Diagnosis not present

## 2017-02-13 DIAGNOSIS — J45909 Unspecified asthma, uncomplicated: Secondary | ICD-10-CM | POA: Diagnosis not present

## 2017-02-13 DIAGNOSIS — Z8673 Personal history of transient ischemic attack (TIA), and cerebral infarction without residual deficits: Secondary | ICD-10-CM | POA: Diagnosis not present

## 2017-02-13 DIAGNOSIS — E7800 Pure hypercholesterolemia, unspecified: Secondary | ICD-10-CM | POA: Insufficient documentation

## 2017-02-13 DIAGNOSIS — Z8701 Personal history of pneumonia (recurrent): Secondary | ICD-10-CM | POA: Insufficient documentation

## 2017-02-13 DIAGNOSIS — E119 Type 2 diabetes mellitus without complications: Secondary | ICD-10-CM | POA: Insufficient documentation

## 2017-02-13 DIAGNOSIS — E669 Obesity, unspecified: Secondary | ICD-10-CM | POA: Insufficient documentation

## 2017-02-13 DIAGNOSIS — Z95810 Presence of automatic (implantable) cardiac defibrillator: Secondary | ICD-10-CM | POA: Diagnosis not present

## 2017-02-13 DIAGNOSIS — Z7982 Long term (current) use of aspirin: Secondary | ICD-10-CM | POA: Diagnosis not present

## 2017-02-13 DIAGNOSIS — Z955 Presence of coronary angioplasty implant and graft: Secondary | ICD-10-CM | POA: Diagnosis not present

## 2017-02-13 DIAGNOSIS — Z79899 Other long term (current) drug therapy: Secondary | ICD-10-CM | POA: Insufficient documentation

## 2017-02-13 DIAGNOSIS — I110 Hypertensive heart disease with heart failure: Secondary | ICD-10-CM | POA: Diagnosis not present

## 2017-02-13 DIAGNOSIS — M1990 Unspecified osteoarthritis, unspecified site: Secondary | ICD-10-CM | POA: Insufficient documentation

## 2017-02-13 DIAGNOSIS — Z9981 Dependence on supplemental oxygen: Secondary | ICD-10-CM | POA: Diagnosis not present

## 2017-02-13 DIAGNOSIS — I509 Heart failure, unspecified: Secondary | ICD-10-CM | POA: Diagnosis not present

## 2017-02-13 DIAGNOSIS — M109 Gout, unspecified: Secondary | ICD-10-CM | POA: Diagnosis not present

## 2017-02-13 DIAGNOSIS — Z6841 Body Mass Index (BMI) 40.0 and over, adult: Secondary | ICD-10-CM | POA: Diagnosis not present

## 2017-02-13 DIAGNOSIS — I255 Ischemic cardiomyopathy: Secondary | ICD-10-CM | POA: Diagnosis not present

## 2017-02-13 DIAGNOSIS — I2510 Atherosclerotic heart disease of native coronary artery without angina pectoris: Secondary | ICD-10-CM | POA: Insufficient documentation

## 2017-02-16 ENCOUNTER — Encounter (HOSPITAL_COMMUNITY)
Admission: RE | Admit: 2017-02-16 | Discharge: 2017-02-16 | Disposition: A | Discharge status: Home / Self Care | Payer: Medicare HMO | Source: Ambulatory Visit | Attending: Cardiovascular Disease | Admitting: Cardiovascular Disease

## 2017-02-16 DIAGNOSIS — Z955 Presence of coronary angioplasty implant and graft: Secondary | ICD-10-CM

## 2017-02-16 DIAGNOSIS — Z95810 Presence of automatic (implantable) cardiac defibrillator: Secondary | ICD-10-CM | POA: Diagnosis not present

## 2017-02-16 DIAGNOSIS — Z79899 Other long term (current) drug therapy: Secondary | ICD-10-CM | POA: Diagnosis not present

## 2017-02-16 DIAGNOSIS — I2510 Atherosclerotic heart disease of native coronary artery without angina pectoris: Secondary | ICD-10-CM | POA: Diagnosis not present

## 2017-02-16 DIAGNOSIS — Z7982 Long term (current) use of aspirin: Secondary | ICD-10-CM | POA: Diagnosis not present

## 2017-02-16 DIAGNOSIS — M1990 Unspecified osteoarthritis, unspecified site: Secondary | ICD-10-CM | POA: Diagnosis not present

## 2017-02-16 DIAGNOSIS — Z7902 Long term (current) use of antithrombotics/antiplatelets: Secondary | ICD-10-CM | POA: Diagnosis not present

## 2017-02-16 DIAGNOSIS — Z7951 Long term (current) use of inhaled steroids: Secondary | ICD-10-CM | POA: Diagnosis not present

## 2017-02-16 DIAGNOSIS — Z9981 Dependence on supplemental oxygen: Secondary | ICD-10-CM | POA: Diagnosis not present

## 2017-02-17 ENCOUNTER — Ambulatory Visit (INDEPENDENT_AMBULATORY_CARE_PROVIDER_SITE_OTHER): Payer: Medicare HMO | Admitting: *Deleted

## 2017-02-17 DIAGNOSIS — I442 Atrioventricular block, complete: Secondary | ICD-10-CM | POA: Diagnosis not present

## 2017-02-17 NOTE — Progress Notes (Signed)
Remote pacemaker transmission.   

## 2017-02-18 ENCOUNTER — Encounter (HOSPITAL_COMMUNITY): Payer: Medicare HMO

## 2017-02-18 ENCOUNTER — Telehealth (HOSPITAL_COMMUNITY): Payer: Self-pay | Admitting: Family Medicine

## 2017-02-18 ENCOUNTER — Encounter: Payer: Self-pay | Admitting: Cardiology

## 2017-02-18 DIAGNOSIS — I700 Atherosclerosis of aorta: Secondary | ICD-10-CM | POA: Diagnosis not present

## 2017-02-18 DIAGNOSIS — I5033 Acute on chronic diastolic (congestive) heart failure: Secondary | ICD-10-CM | POA: Diagnosis not present

## 2017-02-18 DIAGNOSIS — M109 Gout, unspecified: Secondary | ICD-10-CM | POA: Diagnosis not present

## 2017-02-18 DIAGNOSIS — E1122 Type 2 diabetes mellitus with diabetic chronic kidney disease: Secondary | ICD-10-CM | POA: Diagnosis not present

## 2017-02-18 DIAGNOSIS — E7800 Pure hypercholesterolemia, unspecified: Secondary | ICD-10-CM | POA: Diagnosis not present

## 2017-02-19 LAB — CUP PACEART REMOTE DEVICE CHECK
Battery Remaining Longevity: 84 mo
Battery Remaining Percentage: 100 %
Brady Statistic RA Percent Paced: 0 %
Brady Statistic RV Percent Paced: 100 %
Date Time Interrogation Session: 20190105201500
Implantable Lead Implant Date: 20080923
Implantable Lead Implant Date: 20080923
Implantable Lead Implant Date: 20080923
Implantable Lead Location: 753858
Implantable Lead Location: 753859
Implantable Lead Location: 753860
Implantable Lead Model: 4285
Implantable Lead Model: 4555
Implantable Lead Model: 7120
Implantable Lead Serial Number: 170220
Implantable Lead Serial Number: 486468
Implantable Pulse Generator Implant Date: 20140923
Lead Channel Impedance Value: 1022 Ohm
Lead Channel Impedance Value: 632 Ohm
Lead Channel Impedance Value: 806 Ohm
Lead Channel Pacing Threshold Amplitude: 0.7 V
Lead Channel Pacing Threshold Amplitude: 1.2 V
Lead Channel Pacing Threshold Pulse Width: 0.4 ms
Lead Channel Pacing Threshold Pulse Width: 0.8 ms
Lead Channel Setting Pacing Amplitude: 1.9 V
Lead Channel Setting Pacing Amplitude: 2 V
Lead Channel Setting Pacing Pulse Width: 0.4 ms
Lead Channel Setting Pacing Pulse Width: 0.8 ms
Lead Channel Setting Sensing Sensitivity: 2.5 mV
Lead Channel Setting Sensing Sensitivity: 2.5 mV
Pulse Gen Serial Number: 100731

## 2017-02-20 ENCOUNTER — Encounter (HOSPITAL_COMMUNITY)
Admission: RE | Admit: 2017-02-20 | Discharge: 2017-02-20 | Disposition: A | Discharge status: Home / Self Care | Payer: Medicare HMO | Source: Ambulatory Visit | Attending: Cardiovascular Disease | Admitting: Cardiovascular Disease

## 2017-02-20 DIAGNOSIS — Z7951 Long term (current) use of inhaled steroids: Secondary | ICD-10-CM | POA: Diagnosis not present

## 2017-02-20 DIAGNOSIS — M84375D Stress fracture, left foot, subsequent encounter for fracture with routine healing: Secondary | ICD-10-CM | POA: Diagnosis not present

## 2017-02-20 DIAGNOSIS — I2510 Atherosclerotic heart disease of native coronary artery without angina pectoris: Secondary | ICD-10-CM | POA: Diagnosis not present

## 2017-02-20 DIAGNOSIS — Z7982 Long term (current) use of aspirin: Secondary | ICD-10-CM | POA: Diagnosis not present

## 2017-02-20 DIAGNOSIS — Z9981 Dependence on supplemental oxygen: Secondary | ICD-10-CM | POA: Diagnosis not present

## 2017-02-20 DIAGNOSIS — Z955 Presence of coronary angioplasty implant and graft: Secondary | ICD-10-CM | POA: Diagnosis not present

## 2017-02-20 DIAGNOSIS — Z95810 Presence of automatic (implantable) cardiac defibrillator: Secondary | ICD-10-CM | POA: Diagnosis not present

## 2017-02-20 DIAGNOSIS — Z79899 Other long term (current) drug therapy: Secondary | ICD-10-CM | POA: Diagnosis not present

## 2017-02-20 DIAGNOSIS — Z7902 Long term (current) use of antithrombotics/antiplatelets: Secondary | ICD-10-CM | POA: Diagnosis not present

## 2017-02-20 DIAGNOSIS — M2042 Other hammer toe(s) (acquired), left foot: Secondary | ICD-10-CM | POA: Diagnosis not present

## 2017-02-20 DIAGNOSIS — M1990 Unspecified osteoarthritis, unspecified site: Secondary | ICD-10-CM | POA: Diagnosis not present

## 2017-02-23 ENCOUNTER — Encounter (HOSPITAL_COMMUNITY)
Admission: RE | Admit: 2017-02-23 | Discharge: 2017-02-23 | Disposition: A | Discharge status: Home / Self Care | Payer: Medicare HMO | Source: Ambulatory Visit | Attending: Cardiovascular Disease | Admitting: Cardiovascular Disease

## 2017-02-23 DIAGNOSIS — Z79899 Other long term (current) drug therapy: Secondary | ICD-10-CM | POA: Diagnosis not present

## 2017-02-23 DIAGNOSIS — Z955 Presence of coronary angioplasty implant and graft: Secondary | ICD-10-CM | POA: Diagnosis not present

## 2017-02-23 DIAGNOSIS — M1990 Unspecified osteoarthritis, unspecified site: Secondary | ICD-10-CM | POA: Diagnosis not present

## 2017-02-23 DIAGNOSIS — Z7902 Long term (current) use of antithrombotics/antiplatelets: Secondary | ICD-10-CM | POA: Diagnosis not present

## 2017-02-23 DIAGNOSIS — Z9981 Dependence on supplemental oxygen: Secondary | ICD-10-CM | POA: Diagnosis not present

## 2017-02-23 DIAGNOSIS — Z7982 Long term (current) use of aspirin: Secondary | ICD-10-CM | POA: Diagnosis not present

## 2017-02-23 DIAGNOSIS — Z95810 Presence of automatic (implantable) cardiac defibrillator: Secondary | ICD-10-CM | POA: Diagnosis not present

## 2017-02-23 DIAGNOSIS — Z7951 Long term (current) use of inhaled steroids: Secondary | ICD-10-CM | POA: Diagnosis not present

## 2017-02-23 DIAGNOSIS — I2510 Atherosclerotic heart disease of native coronary artery without angina pectoris: Secondary | ICD-10-CM | POA: Diagnosis not present

## 2017-02-25 ENCOUNTER — Encounter (HOSPITAL_COMMUNITY)
Admission: RE | Admit: 2017-02-25 | Discharge: 2017-02-25 | Disposition: A | Discharge status: Home / Self Care | Payer: Medicare HMO | Source: Ambulatory Visit | Attending: Cardiovascular Disease | Admitting: Cardiovascular Disease

## 2017-02-25 DIAGNOSIS — R269 Unspecified abnormalities of gait and mobility: Secondary | ICD-10-CM | POA: Diagnosis not present

## 2017-02-25 DIAGNOSIS — I2510 Atherosclerotic heart disease of native coronary artery without angina pectoris: Secondary | ICD-10-CM | POA: Diagnosis not present

## 2017-02-25 DIAGNOSIS — I5032 Chronic diastolic (congestive) heart failure: Secondary | ICD-10-CM | POA: Diagnosis not present

## 2017-02-25 DIAGNOSIS — M1990 Unspecified osteoarthritis, unspecified site: Secondary | ICD-10-CM | POA: Diagnosis not present

## 2017-02-25 DIAGNOSIS — Z79899 Other long term (current) drug therapy: Secondary | ICD-10-CM | POA: Diagnosis not present

## 2017-02-25 DIAGNOSIS — Z95810 Presence of automatic (implantable) cardiac defibrillator: Secondary | ICD-10-CM | POA: Diagnosis not present

## 2017-02-25 DIAGNOSIS — I10 Essential (primary) hypertension: Secondary | ICD-10-CM | POA: Diagnosis not present

## 2017-02-25 DIAGNOSIS — Z955 Presence of coronary angioplasty implant and graft: Secondary | ICD-10-CM | POA: Diagnosis not present

## 2017-02-25 DIAGNOSIS — J4520 Mild intermittent asthma, uncomplicated: Secondary | ICD-10-CM | POA: Diagnosis not present

## 2017-02-25 DIAGNOSIS — Z7982 Long term (current) use of aspirin: Secondary | ICD-10-CM | POA: Diagnosis not present

## 2017-02-25 DIAGNOSIS — Z9981 Dependence on supplemental oxygen: Secondary | ICD-10-CM | POA: Diagnosis not present

## 2017-02-25 DIAGNOSIS — R0602 Shortness of breath: Secondary | ICD-10-CM | POA: Diagnosis not present

## 2017-02-25 DIAGNOSIS — J9611 Chronic respiratory failure with hypoxia: Secondary | ICD-10-CM | POA: Diagnosis not present

## 2017-02-25 DIAGNOSIS — Z7951 Long term (current) use of inhaled steroids: Secondary | ICD-10-CM | POA: Diagnosis not present

## 2017-02-25 DIAGNOSIS — Z7902 Long term (current) use of antithrombotics/antiplatelets: Secondary | ICD-10-CM | POA: Diagnosis not present

## 2017-02-27 ENCOUNTER — Encounter (HOSPITAL_COMMUNITY)
Admission: RE | Admit: 2017-02-27 | Discharge: 2017-02-27 | Disposition: A | Discharge status: Home / Self Care | Payer: Medicare HMO | Source: Ambulatory Visit | Attending: Cardiovascular Disease | Admitting: Cardiovascular Disease

## 2017-02-27 DIAGNOSIS — Z955 Presence of coronary angioplasty implant and graft: Secondary | ICD-10-CM | POA: Diagnosis not present

## 2017-02-27 DIAGNOSIS — Z7951 Long term (current) use of inhaled steroids: Secondary | ICD-10-CM | POA: Diagnosis not present

## 2017-02-27 DIAGNOSIS — Z9981 Dependence on supplemental oxygen: Secondary | ICD-10-CM | POA: Diagnosis not present

## 2017-02-27 DIAGNOSIS — Z7982 Long term (current) use of aspirin: Secondary | ICD-10-CM | POA: Diagnosis not present

## 2017-02-27 DIAGNOSIS — M1990 Unspecified osteoarthritis, unspecified site: Secondary | ICD-10-CM | POA: Diagnosis not present

## 2017-02-27 DIAGNOSIS — I2510 Atherosclerotic heart disease of native coronary artery without angina pectoris: Secondary | ICD-10-CM | POA: Diagnosis not present

## 2017-02-27 DIAGNOSIS — Z95810 Presence of automatic (implantable) cardiac defibrillator: Secondary | ICD-10-CM | POA: Diagnosis not present

## 2017-02-27 DIAGNOSIS — Z7902 Long term (current) use of antithrombotics/antiplatelets: Secondary | ICD-10-CM | POA: Diagnosis not present

## 2017-02-27 DIAGNOSIS — Z79899 Other long term (current) drug therapy: Secondary | ICD-10-CM | POA: Diagnosis not present

## 2017-02-28 IMAGING — CT CT HEAD CODE STROKE
3 series · 15 of 47 positions shown, 18 images · non-contrast
Comparison: CT HEAD October 24, 2015

CLINICAL DATA: Code stroke. RIGHT-sided weakness. Last seen normal
at 5311 hours. Assess stroke. History of hypertension, diabetes,
TIA.

EXAM:
CT HEAD WITHOUT CONTRAST
TECHNIQUE: Contiguous axial images were obtained from the base of the skull
through the vertex without intravenous contrast.

[Series 2: head 5.0 h30s · axial · 0.40mm/px · z∈[-180,-40]mm · 9 of 34 slices shown, 12 images]
[im 3/34  brain]
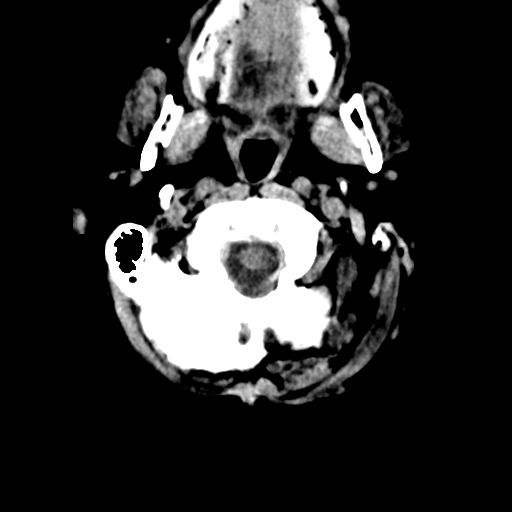
[im 3/34  bone]
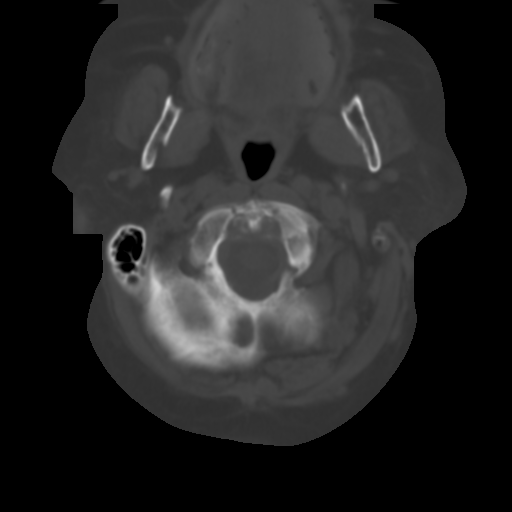
[im 6/34  brain]
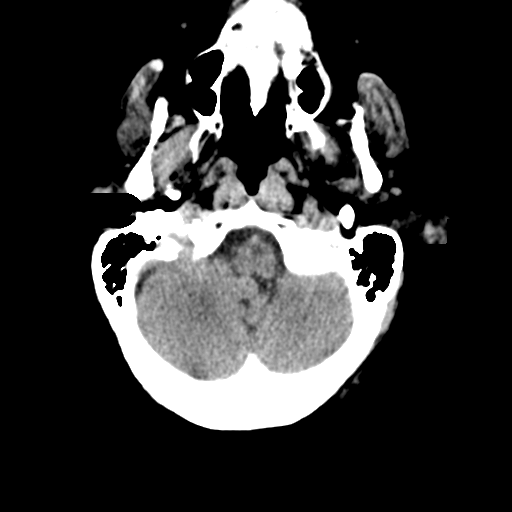
[im 10/34  brain]
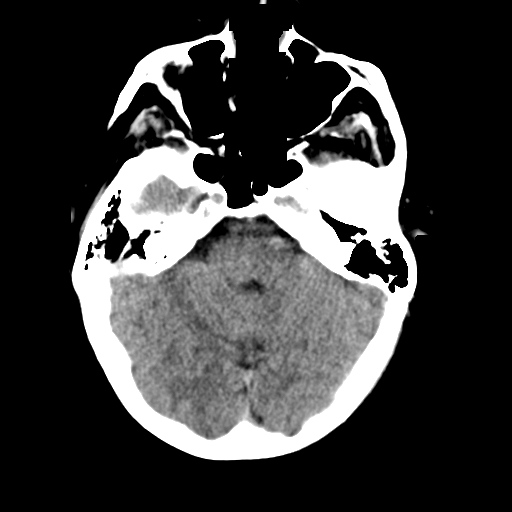
[im 13/34  brain]
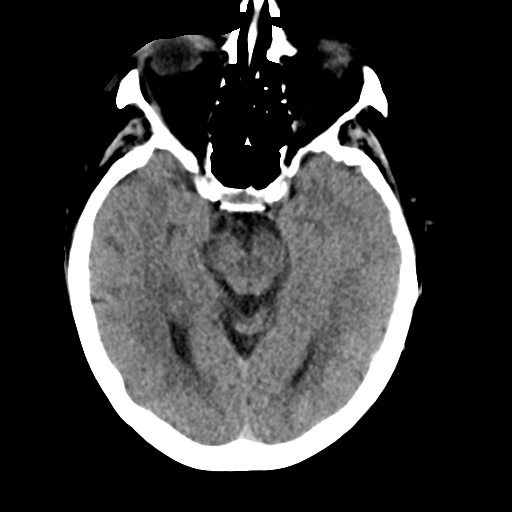
[im 18/34  brain]
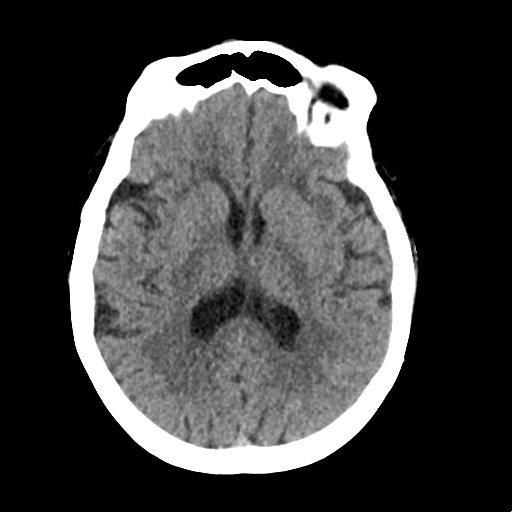
[im 18/34  bone]
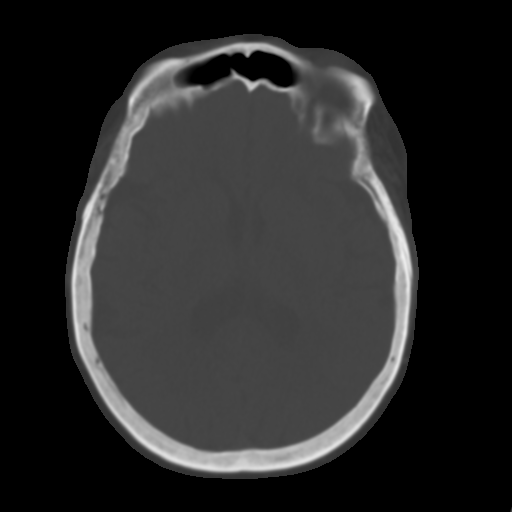
[im 21/34  brain]
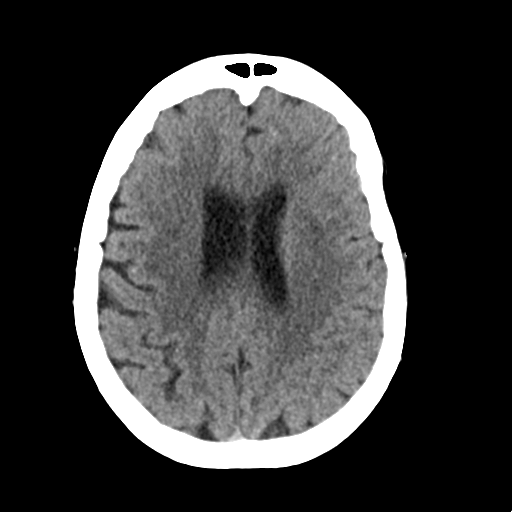
[im 24/34  brain]
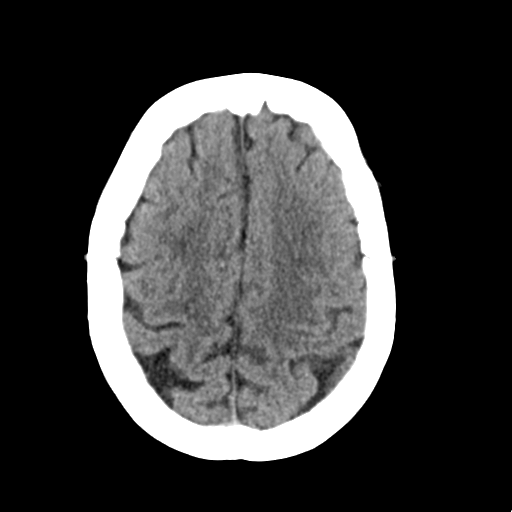
[im 28/34  brain]
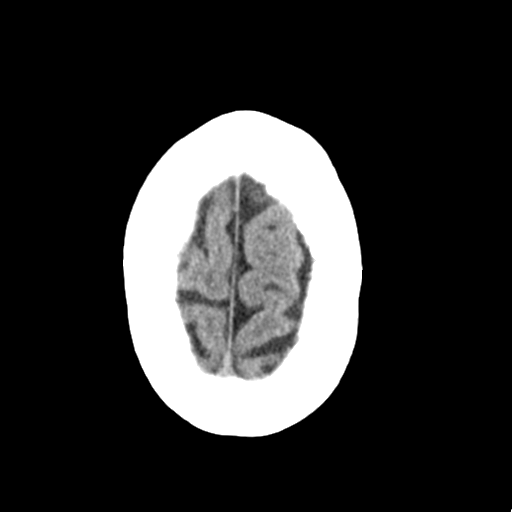
[im 31/34  brain]
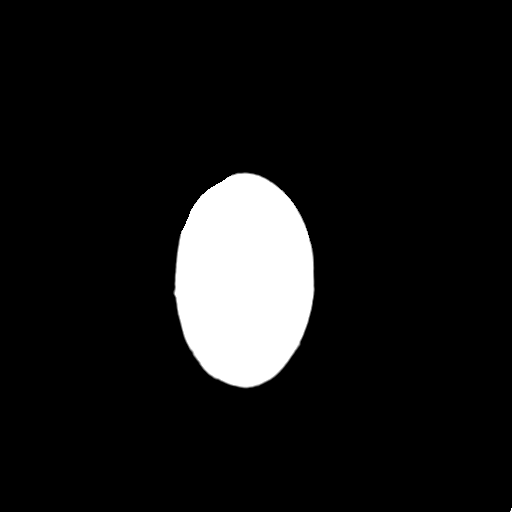
[im 31/34  bone]
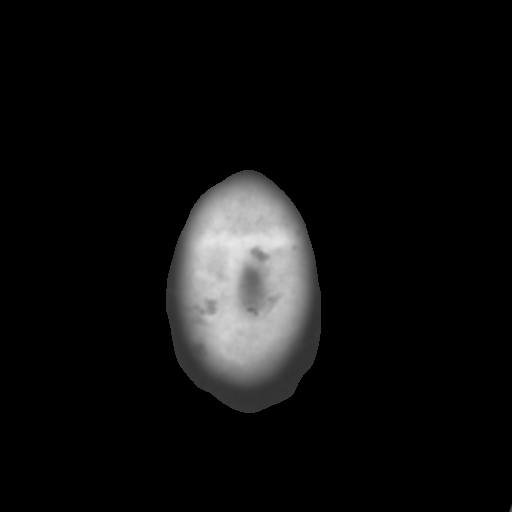

[Series 4: head 3.0 mpr cor · coronal · 0.33mm/px · 3 of 67 slices shown]
[im 23/67  brain]
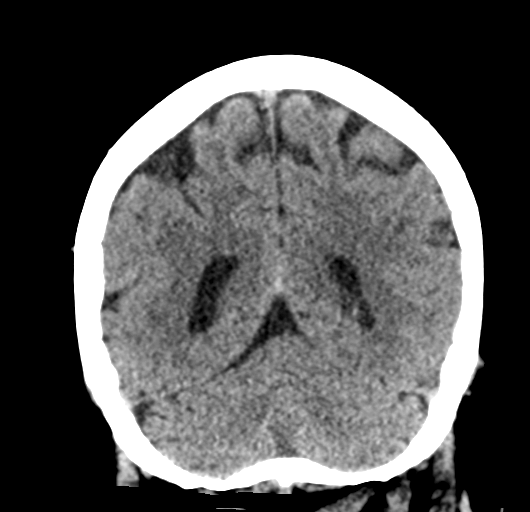
[im 30/67  brain]
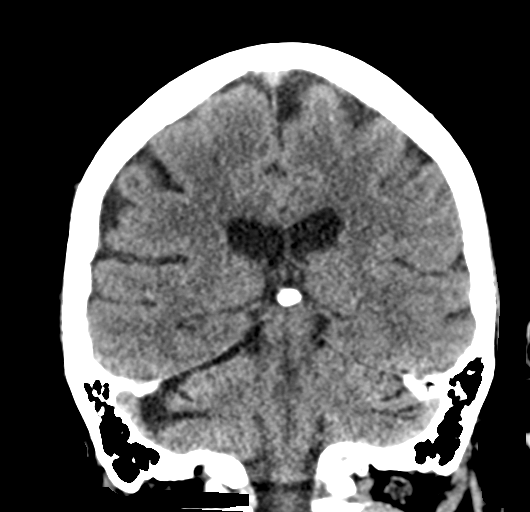
[im 37/67  brain]
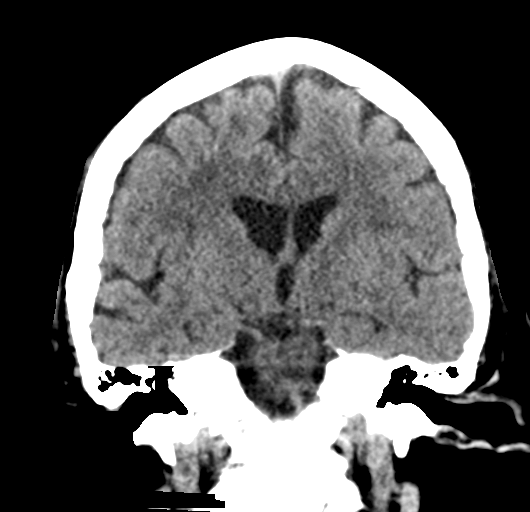

[Series 5: head 3.0 mpr sag · sagittal · 0.33mm/px · 3 of 54 slices shown]
[im 18/54  brain]
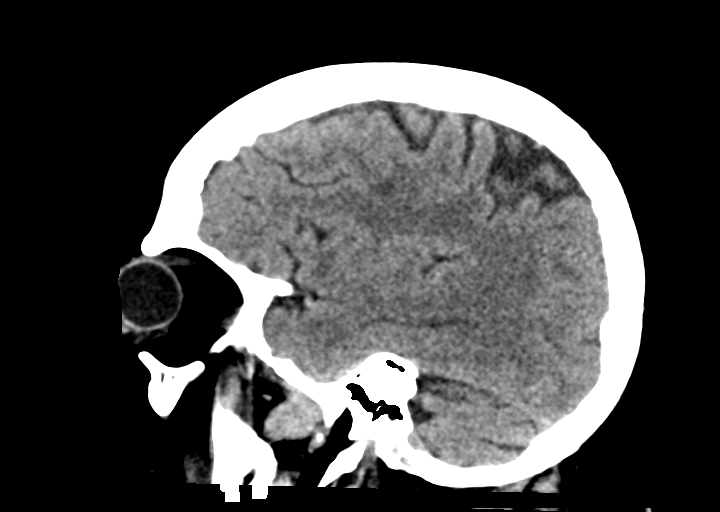
[im 27/54  brain]
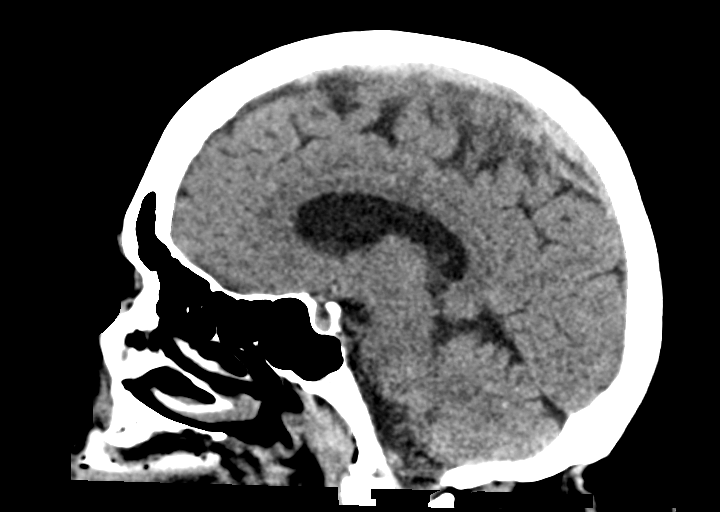
[im 36/54  brain]
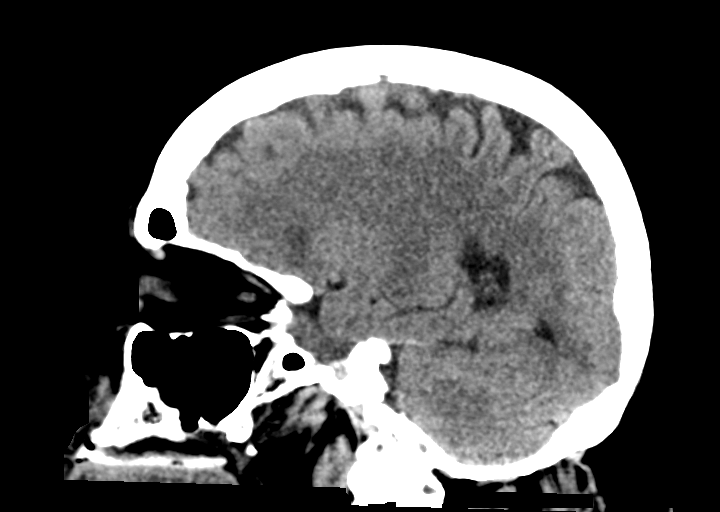

[15 of 47 positions shown; findings below may reference images not displayed]

FINDINGS: BRAIN: The ventricles and sulci are normal for age. No
intraparenchymal hemorrhage, mass effect nor midline shift. Patchy
supratentorial white matter hypodensities within normal range for
patient's age, though non-specific are most compatible with chronic
small vessel ischemic disease. Old LEFT caudate lacunar infarct. No
acute large vascular territory infarcts. No abnormal extra-axial
fluid collections. Basal cisterns are patent.

VASCULAR: Moderate calcific atherosclerosis of the carotid siphons.

SKULL: No skull fracture. No significant scalp soft tissue swelling.

SINUSES/ORBITS: The mastoid air-cells and included paranasal sinuses
are well-aerated. Status post bilateral ocular lens implants. The
included ocular globes and orbital contents are non-suspicious.

OTHER: Patient is edentulous.

ASPECTS ([place] Stroke Program Early CT Score)

- Ganglionic level infarction (caudate, lentiform nuclei, internal
capsule, insula, M1-M3 cortex): 7

- Supraganglionic infarction (M4-M6 cortex): 3

Total score (0-10 with 10 being normal): 10
IMPRESSION: 1. No acute intracranial process.

Old LEFT basal ganglia lacunar infarct and mild to moderate chronic
small vessel ischemic disease.

2. ASPECTS is 10.

Dr. Amazigh paged March 04, 2016 at [DATE]hours, pending return call.

## 2017-03-01 IMAGING — NM NM PULMONARY VENT & PERF
12 series · 12 of 12 positions shown · non-contrast
Comparison: Chest radiograph 03/04/2016

CLINICAL DATA: Short of breath, elevated D-dimer

EXAM:
NUCLEAR MEDICINE VENTILATION - PERFUSION LUNG SCAN
TECHNIQUE: Ventilation images were obtained in multiple projections using
inhaled aerosol Lc-44m DTPA. Perfusion images were obtained in
multiple projections after intravenous injection of Lc-44m MAA.
RADIOPHARMACEUTICALS:  31.5 mCi Wechnetium-00m DTPA aerosol
inhalation and 4.1 mCi Wechnetium-00m MAA IV

[Series 1: ant/post vent · 4.14mm/px · 1 of 1 slices shown (1 of 2)]
[im 1/1  full-range]
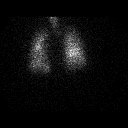

[Series 1: ant/post vent · 4.14mm/px · 1 of 1 slices shown (2 of 2)]
[im 1/1]
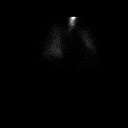

[Series 2: lao/rpo vent · 4.14mm/px · 1 of 1 slices shown (1 of 2)]
[im 1/1  full-range]
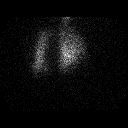

[Series 2: lao/rpo vent · 4.14mm/px · 1 of 1 slices shown (2 of 2)]
[im 1/1]
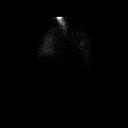

[Series 3: lpo/rao vent · 4.14mm/px · 1 of 1 slices shown (1 of 2)]
[im 1/1  full-range]
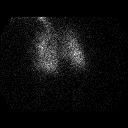

[Series 3: lpo/rao vent · 4.14mm/px · 1 of 1 slices shown (2 of 2)]
[im 1/1]
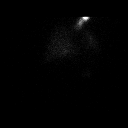

[Series 4: lpo/rao perf · 4.14mm/px · 1 of 1 slices shown (1 of 2)]
[im 1/1]
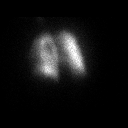

[Series 4: lpo/rao perf · 4.14mm/px · 1 of 1 slices shown (2 of 2)]
[im 1/1]
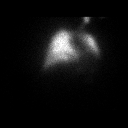

[Series 5: ant/post perf · 4.14mm/px · 1 of 1 slices shown (1 of 2)]
[im 1/1]
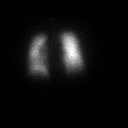

[Series 5: ant/post perf · 4.14mm/px · 1 of 1 slices shown (2 of 2)]
[im 1/1]
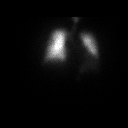

[Series 6: lao/rpo perf · 4.14mm/px · 1 of 1 slices shown (1 of 2)]
[im 1/1]
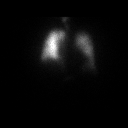

[Series 6: lao/rpo perf · 4.14mm/px · 1 of 1 slices shown (2 of 2)]
[im 1/1]
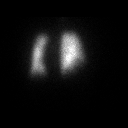

[12 of 12 positions shown; findings below may reference images not displayed]

FINDINGS: Ventilation: No focal ventilation defect.

Perfusion: No wedge shaped peripheral perfusion defects to suggest
acute pulmonary embolism.
IMPRESSION: No evidence of pulmonary embolism.

## 2017-03-02 ENCOUNTER — Encounter (HOSPITAL_COMMUNITY)
Admission: RE | Admit: 2017-03-02 | Discharge: 2017-03-02 | Disposition: A | Discharge status: Home / Self Care | Payer: Medicare HMO | Source: Ambulatory Visit | Attending: Cardiovascular Disease | Admitting: Cardiovascular Disease

## 2017-03-02 DIAGNOSIS — I2510 Atherosclerotic heart disease of native coronary artery without angina pectoris: Secondary | ICD-10-CM | POA: Diagnosis not present

## 2017-03-02 DIAGNOSIS — Z7902 Long term (current) use of antithrombotics/antiplatelets: Secondary | ICD-10-CM | POA: Diagnosis not present

## 2017-03-02 DIAGNOSIS — Z7982 Long term (current) use of aspirin: Secondary | ICD-10-CM | POA: Diagnosis not present

## 2017-03-02 DIAGNOSIS — Z7951 Long term (current) use of inhaled steroids: Secondary | ICD-10-CM | POA: Diagnosis not present

## 2017-03-02 DIAGNOSIS — Z955 Presence of coronary angioplasty implant and graft: Secondary | ICD-10-CM

## 2017-03-02 DIAGNOSIS — Z95810 Presence of automatic (implantable) cardiac defibrillator: Secondary | ICD-10-CM | POA: Diagnosis not present

## 2017-03-02 DIAGNOSIS — Z9981 Dependence on supplemental oxygen: Secondary | ICD-10-CM | POA: Diagnosis not present

## 2017-03-02 DIAGNOSIS — M1990 Unspecified osteoarthritis, unspecified site: Secondary | ICD-10-CM | POA: Diagnosis not present

## 2017-03-02 DIAGNOSIS — Z79899 Other long term (current) drug therapy: Secondary | ICD-10-CM | POA: Diagnosis not present

## 2017-03-02 LAB — GLUCOSE, CAPILLARY: Glucose-Capillary: 129 mg/dL — ABNORMAL HIGH (ref 65–99)

## 2017-03-02 NOTE — Progress Notes (Signed)
Incomplete Session Note  Patient Details  Name: [name] MRN: [id number] Date of Birth: Dec 13, 1945 Referring Provider:     [place] from 12/18/2016 in [place]  Referring Provider  Croitoru, [name] MD, and Bensimhon, Quillian Quince MD      [name] did not complete her rehab session.  [name] says she felt like she was going to pass out while she was in the bathroom. Telemetry rhythm V paced with underlying Sinus Rhythm rate 64. Blood pressure 138/70. Oxygen saturation 97% on 2l/min. [name] denies having chest pain.  CBG 129. Prior to exercising [name] exercised on the arm ergometer for 2 minutes and 53 seconds. Patient taken to the treatment room. Sitting blood pressure 122/80. Standing blood pressure 122/72. [name] said she felt better after sitting down to rest. I called Cecilie Kicks FNP to notify. [name] for [name] to follow up with her primary care provider [name]. [name] office called and notified.Dr Inda Merlin office will call [name] for a follow up appointment either today or tomorrow. [name] thinks she may have a sinus infection. [name] knows that is she has any further episodes she should go to the ED. Patient states understanding.Barnet Pall, RN,BSN 03/02/2017 12:20 PM

## 2017-03-03 ENCOUNTER — Telehealth (HOSPITAL_COMMUNITY): Payer: Self-pay | Admitting: *Deleted

## 2017-03-03 DIAGNOSIS — I2510 Atherosclerotic heart disease of native coronary artery without angina pectoris: Secondary | ICD-10-CM | POA: Diagnosis not present

## 2017-03-03 DIAGNOSIS — I10 Essential (primary) hypertension: Secondary | ICD-10-CM | POA: Diagnosis not present

## 2017-03-03 DIAGNOSIS — I5033 Acute on chronic diastolic (congestive) heart failure: Secondary | ICD-10-CM | POA: Diagnosis not present

## 2017-03-03 DIAGNOSIS — N183 Chronic kidney disease, stage 3 (moderate): Secondary | ICD-10-CM | POA: Diagnosis not present

## 2017-03-03 DIAGNOSIS — I5032 Chronic diastolic (congestive) heart failure: Secondary | ICD-10-CM | POA: Diagnosis not present

## 2017-03-03 DIAGNOSIS — G4733 Obstructive sleep apnea (adult) (pediatric): Secondary | ICD-10-CM | POA: Diagnosis not present

## 2017-03-03 DIAGNOSIS — E1122 Type 2 diabetes mellitus with diabetic chronic kidney disease: Secondary | ICD-10-CM | POA: Diagnosis not present

## 2017-03-03 DIAGNOSIS — J4530 Mild persistent asthma, uncomplicated: Secondary | ICD-10-CM | POA: Diagnosis not present

## 2017-03-03 DIAGNOSIS — Z794 Long term (current) use of insulin: Secondary | ICD-10-CM | POA: Diagnosis not present

## 2017-03-03 DIAGNOSIS — J4520 Mild intermittent asthma, uncomplicated: Secondary | ICD-10-CM | POA: Diagnosis not present

## 2017-03-03 NOTE — Progress Notes (Signed)
Cardiac Individual Treatment Plan  Patient Details  Name: [name] MRN: [id number] Date of Birth: 1945-04-27 Referring Provider:     CARDIAC REHAB PHASE II ORIENTATION from 12/18/2016 in [facility]  Referring Provider  Croitoru, Dani Gobble MD, and Glori Bickers MD      Initial Encounter Date:    CARDIAC REHAB PHASE II ORIENTATION from 12/18/2016 in [facility]  Date  12/18/16  Referring Provider  Croitoru, [name] MD, and Glori Bickers MD      Visit Diagnosis: Status post coronary artery stent placement 10/31/16 S/P DES CFX  Patient's Home Medications on Admission:  Current Outpatient Medications:  .  acetaminophen (TYLENOL) 500 MG tablet, Take 1,000 mg by mouth every 6 (six) hours as needed (for pain.)., Disp: , Rfl:  .  albuterol (PROVENTIL) (2.5 MG/3ML) 0.083% nebulizer solution, Take 2.5 mg by nebulization every 6 (six) hours as needed for wheezing or shortness of breath., Disp: , Rfl:  .  aspirin EC 81 MG tablet, Take 1 tablet (81 mg total) by mouth daily., Disp: 90 tablet, Rfl: 3 .  bisoprolol (ZEBETA) 5 MG tablet, Take 1 tablet (5 mg total) by mouth daily., Disp: 90 tablet, Rfl: 3 .  budesonide-formoterol (SYMBICORT) 160-4.5 MCG/ACT inhaler, Inhale 2 puffs into the lungs 2 (two) times daily., Disp: , Rfl:  .  cetirizine (ZYRTEC) 10 MG tablet, Take 10 mg by mouth daily. , Disp: , Rfl:  .  clopidogrel (PLAVIX) 75 MG tablet, TAKE 1 TABLET EVERY DAY, Disp: 90 tablet, Rfl: 2 .  colchicine 0.6 MG tablet, Take one tablet two times daily as needed. (Patient taking differently: Take 0.6 mg by mouth 2 (two) times daily as needed (gout flare). ), Disp: 30 tablet, Rfl: 0 .  docusate sodium (COLACE) 100 MG capsule, Take 200 mg by mouth at bedtime. , Disp: , Rfl:  .  EPINEPHrine (EPIPEN 2-PAK) 0.3 mg/0.3 mL IJ SOAJ injection, Inject 0.3 mg into the muscle once as needed (severe allergic reaction)., Disp: , Rfl:  .   fluticasone (FLONASE) 50 MCG/ACT nasal spray, Place 1 spray into both nostrils at bedtime as needed for rhinitis or allergies. , Disp: , Rfl:  .  GuaiFENesin (MUCINEX PO), Take 1 tablet by mouth at bedtime., Disp: , Rfl:  .  hydroxypropyl methylcellulose / hypromellose (ISOPTO TEARS / GONIOVISC) 2.5 % ophthalmic solution, Place 1 drop into both eyes 4 (four) times daily as needed for dry eyes. , Disp: , Rfl:  .  ipratropium (ATROVENT) 0.06 % nasal spray, 1-2 pffs up to 4 x daily as needed, Disp: 15 mL, Rfl: 12 .  isosorbide mononitrate (IMDUR) 60 MG 24 hr tablet, Take 1 tablet (60 mg total) by mouth daily., Disp: 30 tablet, Rfl: 3 .  ketotifen (ZADITOR) 0.025 % ophthalmic solution, Place 1 drop into both eyes daily., Disp: , Rfl:  .  KLOR-CON M20 20 MEQ tablet, TAKE 1 TABLET EVERY DAY, Disp: 90 tablet, Rfl: 2 .  metolazone (ZAROXOLYN) 5 MG tablet, Take 1 tablet (5 mg total) by mouth as needed. Take only as directed by [facility] (Patient taking differently: Take 5 mg by mouth See admin instructions. Take 1 tablet (5 mg) by mouth when directed by [facility]), Disp: 5 tablet, Rfl: 0 .  Multiple Vitamin (MULTIVITAMIN WITH MINERALS) TABS tablet, Take 1 tablet by mouth daily., Disp: , Rfl:  .  nitroGLYCERIN (NITROSTAT) 0.4 MG SL tablet, Place 1 tablet (0.4 mg total) under  the tongue every 5 (five) minutes as needed for chest pain., Disp: 25 tablet, Rfl: 3 .  OXYGEN, Inhale 2 L into the lungs See admin instructions. During waking hours as needed for low oxygen levels & at bedtime each night., Disp: , Rfl:  .  pantoprazole (PROTONIX) 40 MG tablet, Take 1 tablet (40 mg total) by mouth daily., Disp: 90 tablet, Rfl: 3 .  rosuvastatin (CRESTOR) 20 MG tablet, TAKE 1 TABLET EVERY DAY (Patient taking differently: TAKE 1 TABLET (20 MG) BY MOUTH DAILY AT BEDTIME), Disp: 90 tablet, Rfl: 3 .  spironolactone (ALDACTONE) 25 MG tablet, TAKE 1 TABLET EVERY DAY (Patient taking differently: TAKE 1 TABLET  (25 MG) BY MOUTH DAILY AT BEDTIME), Disp: 90 tablet, Rfl: 3 .  torsemide (DEMADEX) 20 MG tablet, Take 2 tablets (40 mg total) by mouth daily. (Patient taking differently: Take 40 mg by mouth See admin instructions. Take 2 tablets (40 mg) by mouth every morning, take 1 tablet (20 mg) in the evening as needed for weight gain of 2 lbs in 24 hours), Disp: 180 tablet, Rfl: 3 .  triamcinolone cream (KENALOG) 0.1 %, Apply 1 application topically 2 (two) times daily as needed (itching). , Disp: , Rfl:  .  VENTOLIN HFA 108 (90 Base) MCG/ACT inhaler, INHALE 2 PUFFS EVERY 4 HOURS AS NEEDED FOR WHEEZING, Disp: 18 g, Rfl: 3  Past Medical History: Past Medical History:  Diagnosis Date  . AICD (automatic cardioverter/defibrillator) present   . Anemia   . Arthritis   . Asthma   . CAD (coronary artery disease)    a. 10/09/16 LHC: SVG->LAD patent, SVG->Diag patent, SVG->RCA patent, SVG->LCx occluded. EF 60%, b. 10/31/16 LHC DES to AV groove Circ, DES to intermed branch  . Cellulitis and abscess of foot 12/2014   RT FOOT  . CHF (congestive heart failure) ([place])   . Chronic bronchitis ([place])   . Chronic lower back pain   . Diverticulosis   . Facial numbness 02/2016  . Fatty liver disease, nonalcoholic   . Gastritis   . GERD (gastroesophageal reflux disease)   . Gout   . H/O hiatal hernia   . High cholesterol   . Hyperplastic colon polyp   . Hypertension   . IBS (irritable bowel syndrome)   . Internal hemorrhoids   . Ischemic cardiomyopathy   . Obesity   . On home oxygen therapy    "2L at night" (10/31/2016)  . OSA (obstructive sleep apnea)    "can't tolerate a mask" (10/30/2016)  . Pneumonia    "couple times" (10/31/2016)  . Shortness of breath   . TIA (transient ischemic attack)    "recently" (10/31/2016)  . Type II diabetes mellitus ([facility])     Tobacco Use: Social History   Tobacco Use  Smoking Status Former Smoker  . Packs/day: 0.25  . Years: 3.00  . Pack years: 0.75  . Types: Cigarettes    . Last attempt to quit: 02/11/1968  . Years since quitting: 49.0  Smokeless Tobacco Never Used    Labs: Recent Chemical engineer    Labs for ITP Cardiac and Pulmonary Rehab Latest Ref Rng & Units 03/04/2016 10/09/2016 10/09/2016 10/30/2016 10/31/2016   Cholestrol 0 - 200 mg/dL - - - - 166   LDLCALC 0 - 99 mg/dL - - - - 91   HDL >40 mg/dL - - - - 39(L)   Trlycerides <150 mg/dL - - - - 181(H)   Hemoglobin A1c 4.8 - 5.6 % - - -  6.9(H) -   PHART 7.350 - 7.450 - 7.338(L) - - -   PCO2ART 32.0 - 48.0 mmHg - 57.7(H) - - -   HCO3 20.0 - 28.0 mmol/L - 31.0(H) 29.1(H) - -   TCO2 22 - 32 mmol/L 31 33(H) 31 - -   ACIDBASEDEF 0.0 - 2.0 mmol/L - - - - -   O2SAT % - 95.0 65.0 - -      Capillary Blood Glucose: Lab Results  Component Value Date   GLUCAP 129 (H) 03/02/2017   GLUCAP 116 (H) 01/07/2017   GLUCAP 108 (H) 01/05/2017   GLUCAP 128 (H) 12/29/2016   GLUCAP 110 (H) 12/29/2016     Exercise Target Goals:    Exercise Program Goal: Individual exercise prescription set using results from initial 6 min walk test and THRR while considering  patient's activity barriers and safety.   Exercise Prescription Goal: Initial exercise prescription builds to 30-45 minutes a day of aerobic activity, 2-3 days per week.  Home exercise guidelines will be given to patient during program as part of exercise prescription that the participant will acknowledge.  Activity Barriers & Risk Stratification: Activity Barriers & Cardiac Risk Stratification - 12/18/16 0925      Activity Barriers & Cardiac Risk Stratification   Activity Barriers  Joint Problems;Muscular Weakness;Shortness of Breath;Balance Concerns;Other (comment)    Comments  L broken shoulder, hx of L broken foot and ankle    Cardiac Risk Stratification  High       6 Minute Walk: 6 Minute Walk    Row Name 12/18/16 1242         6 Minute Walk   Phase  Initial     Distance  716 feet     Walk Time  3.25 minutes     # of Rest Breaks  3      MPH  2.5     METS  0.78     RPE  13     Perceived Dyspnea   3     Symptoms  Yes (comment)     Comments  fatigue/SOB. Pt took 3 rest breaks for a total of 2 minutes and 35 seconds     Resting HR  68 bpm     Resting BP  144/82     Resting Oxygen Saturation   96 %     Exercise Oxygen Saturation  during 6 min walk  94 %     Max Ex. HR  85 bpm     Max Ex. BP  164/84     2 Minute Post BP  122/72        Oxygen Initial Assessment:   Oxygen Re-Evaluation:   Oxygen Discharge (Final Oxygen Re-Evaluation):   Initial Exercise Prescription: Initial Exercise Prescription - 12/18/16 1200      Date of Initial Exercise RX and Referring Provider   Date  12/18/16    Referring Provider  Croitoru, [name] MD, and Bensimhon, Daniel MD      Recumbant Bike   Level  --    Minutes  --    METs  --      NuStep   Level  1    SPM  50    Minutes  15    METs  1      Arm Ergometer   Level  1    Minutes  15    METs  1      Prescription Details   Frequency (times per week)  3    Duration  Progress to 30 minutes of continuous aerobic without signs/symptoms of physical distress      Intensity   THRR 40-80% of Max Heartrate  59-118    Ratings of Perceived Exertion  11-15    Perceived Dyspnea  0-4      Progression   Progression  Continue to progress workloads to maintain intensity without signs/symptoms of physical distress.      Resistance Training   Training Prescription  Yes    Weight  1lb    Reps  10-15       Perform Capillary Blood Glucose checks as needed.  Exercise Prescription Changes:  Exercise Prescription Changes    Row Name 12/24/16 1528 01/14/17 1602 01/26/17 1556 02/16/17 1522 02/27/17 1524     Response to Exercise   Blood Pressure (Admit)  117/72  118/78  128/82  120/82  128/82   Blood Pressure (Exercise)  120/80  126/74  136/84  136/60  128/76   Blood Pressure (Exit)  114/70  106/80  126/80  118/62  122/80   Heart Rate (Admit)  67 bpm  72 bpm  65 bpm  69 bpm  68  bpm   Heart Rate (Exercise)  78 bpm  73 bpm  81 bpm  77 bpm  84 bpm   Heart Rate (Exit)  63 bpm  71 bpm  65 bpm  70 bpm  68 bpm   Rating of Perceived Exertion (Exercise)  13  11  11  11  11    Symptoms  none  L leg pain  none  None  None   Comments  pt oriented to exercise equipment today  weight is elevated from previous session  -  -  -   Duration  Progress to 30 minutes of  aerobic without signs/symptoms of physical distress  Progress to 30 minutes of  aerobic without signs/symptoms of physical distress  Progress to 30 minutes of  aerobic without signs/symptoms of physical distress  Progress to 30 minutes of  aerobic without signs/symptoms of physical distress  Progress to 30 minutes of  aerobic without signs/symptoms of physical distress   Intensity  THRR unchanged  THRR unchanged  THRR unchanged  THRR unchanged  THRR unchanged     Progression   Progression  Continue to progress workloads to maintain intensity without signs/symptoms of physical distress.  Continue to progress workloads to maintain intensity without signs/symptoms of physical distress.  Continue to progress workloads to maintain intensity without signs/symptoms of physical distress.  Continue to progress workloads to maintain intensity without signs/symptoms of physical distress.  Continue to progress workloads to maintain intensity without signs/symptoms of physical distress.   Average METs  1.6  1.5  1.9  2.01  2.01     Resistance Training   Training Prescription  No relaxation day  No relaxation day  No  Yes  Yes   Weight  -  -  -  3lbs  3lbs   Reps  -  -  -  10-15  10-15   Time  -  -  -  10 Minutes  10 Minutes     Interval Training   Interval Training  -  -  -  No  No     NuStep   Level  1  1  2  3  3    SPM  70  70  70  70  70   Minutes  20  25  15   15  15   METs  1.6  1.5  1.8  1.9  2     Arm Ergometer   Level  -  -  1  1  1    Minutes  -  -  5  5  5    METs  -  -  2.12  2.12  2.13     Home Exercise Plan    Plans to continue exercise at  -  Home (comment) walking and chair exercises  Home (comment) walking and seated chair exercises  Home (comment) walking and seated chair exercises  Home (comment) walking and seated chair exercises   Frequency  -  Add 2 additional days to program exercise sessions.  Add 2 additional days to program exercise sessions.  Add 2 additional days to program exercise sessions.  Add 2 additional days to program exercise sessions.   Initial Home Exercises Provided  -  01/12/17  01/12/17  01/12/17  01/12/17      Exercise Comments:  Exercise Comments    Row Name 12/24/16 1620 12/25/16 1535 01/06/17 1619 01/27/17 1603 03/04/17 1527   Exercise Comments  Pt oriented to exercise equipment today. Pt responded well to first exercise session.  Pt oriented to exercise equipment today. Pt responded well to first exercise session.  Pt oriented to exercise equipment today. Pt responded well to first exercise session.  Reviewed METs and goals. Pt is tolerating exercise fairly well, but is limited by L leg and L shoulder pain. Pt understands exercise limitations and is able to exercise and move with restrictions.   Reviewed METs and goals with pt. Pt is responding well to exercise prescription well despite left leg and shoulder pain. Will continue to monitor and progress pt.       Exercise Goals and Review:  Exercise Goals    Row Name 12/18/16 0926             Exercise Goals   Increase Physical Activity  Yes       Intervention  Provide advice, education, support and counseling about physical activity/exercise needs.;Develop an individualized exercise prescription for aerobic and resistive training based on initial evaluation findings, risk stratification, comorbidities and participant's personal goals.       Expected Outcomes  Achievement of increased cardiorespiratory fitness and enhanced flexibility, muscular endurance and strength shown through measurements of functional capacity  and personal statement of participant.       Increase Strength and Stamina  Yes       Intervention  Provide advice, education, support and counseling about physical activity/exercise needs.;Develop an individualized exercise prescription for aerobic and resistive training based on initial evaluation findings, risk stratification, comorbidities and participant's personal goals.       Expected Outcomes  Achievement of increased cardiorespiratory fitness and enhanced flexibility, muscular endurance and strength shown through measurements of functional capacity and personal statement of participant.       Able to understand and use rate of perceived exertion (RPE) scale  Yes       Intervention  Provide education and explanation on how to use RPE scale       Expected Outcomes  Short Term: Able to use RPE daily in rehab to express subjective intensity level;Long Term:  Able to use RPE to guide intensity level when exercising independently       Able to understand and use Dyspnea scale  Yes       Intervention  Provide education and explanation on how to use Dyspnea scale  Expected Outcomes  Short Term: Able to use Dyspnea scale daily in rehab to express subjective sense of shortness of breath during exertion;Long Term: Able to use Dyspnea scale to guide intensity level when exercising independently       Knowledge and understanding of Target Heart Rate Range (THRR)  Yes       Intervention  Provide education and explanation of THRR including how the numbers were predicted and where they are located for reference       Expected Outcomes  Short Term: Able to state/look up THRR;Long Term: Able to use THRR to govern intensity when exercising independently;Short Term: Able to use daily as guideline for intensity in rehab       Able to check pulse independently  Yes       Intervention  Provide education and demonstration on how to check pulse in carotid and radial arteries.;Review the importance of being able  to check your own pulse for safety during independent exercise       Expected Outcomes  Short Term: Able to explain why pulse checking is important during independent exercise;Long Term: Able to check pulse independently and accurately       Understanding of Exercise Prescription  Yes       Intervention  Provide education, explanation, and written materials on patient's individual exercise prescription       Expected Outcomes  Short Term: Able to explain program exercise prescription;Long Term: Able to explain home exercise prescription to exercise independently          Exercise Goals Re-Evaluation : Exercise Goals Re-Evaluation    Row Name 01/06/17 1619 01/27/17 1605 03/04/17 1528         Exercise Goal Re-Evaluation   Exercise Goals Review  Able to understand and use rate of perceived exertion (RPE) scale;Understanding of Exercise Prescription  Increase Physical Activity;Able to understand and use rate of perceived exertion (RPE) scale;Knowledge and understanding of Target Heart Rate Range (THRR);Understanding of Exercise Prescription;Increase Strength and Stamina;Able to check pulse independently  Increase Physical Activity;Able to understand and use rate of perceived exertion (RPE) scale;Knowledge and understanding of Target Heart Rate Range (THRR);Understanding of Exercise Prescription;Increase Strength and Stamina;Able to check pulse independently     Comments  Pt has a great understanding of ExRx and demonstrates proper use of RPE scale with activity.  Pt is active with chores at home and is compliant with chair exercises. Pt enjoys coming to cardiac rehab and is able to exercise though limited by joint pain  Pt really enjoys cardiac rehab and states she is feeling stronger. Pt states she is able to grocery shop without a lot of dyspnea. Pt is responding well to exericse. Will continue to monitor and progress pt.      Expected Outcomes  Pt will continue to improve in cardiorespiratory  fitness.   Pt will continue to improve in cardiorespiratory fitness, functional mobility and overall health  Pt will continue to improve cardiorespiratory fitness and functional mobility.          Discharge Exercise Prescription (Final Exercise Prescription Changes): Exercise Prescription Changes - 02/27/17 1524      Response to Exercise   Blood Pressure (Admit)  128/82    Blood Pressure (Exercise)  128/76    Blood Pressure (Exit)  122/80    Heart Rate (Admit)  68 bpm    Heart Rate (Exercise)  84 bpm    Heart Rate (Exit)  68 bpm    Rating of Perceived Exertion (Exercise)  11    Symptoms  None    Duration  Progress to 30 minutes of  aerobic without signs/symptoms of physical distress    Intensity  THRR unchanged      Progression   Progression  Continue to progress workloads to maintain intensity without signs/symptoms of physical distress.    Average METs  2.01      Resistance Training   Training Prescription  Yes    Weight  3lbs    Reps  10-15    Time  10 Minutes      Interval Training   Interval Training  No      NuStep   Level  3    SPM  70    Minutes  15    METs  2      Arm Ergometer   Level  1    Minutes  5    METs  2.13      Home Exercise Plan   Plans to continue exercise at  Home (comment) walking and seated chair exercises    Frequency  Add 2 additional days to program exercise sessions.    Initial Home Exercises Provided  01/12/17       Nutrition:  Target Goals: Understanding of nutrition guidelines, daily intake of sodium 1500mg , cholesterol 200mg , calories 30% from fat and 7% or less from saturated fats, daily to have 5 or more servings of fruits and vegetables.  Biometrics: Pre Biometrics - 12/18/16 1300      Pre Biometrics   Height  5' 2.25" (1.581 m)    Weight  257 lb 8 oz (116.8 kg)    Waist Circumference  53 inches    Hip Circumference  52 inches    Waist to Hip Ratio  1.02 %    BMI (Calculated)  46.73    Triceps Skinfold  63 mm    %  Body Fat  61.2 %    Grip Strength  28 kg    Single Leg Stand  1.23 seconds        Nutrition Therapy Plan and Nutrition Goals: Nutrition Therapy & Goals - 12/19/16 1331      Nutrition Therapy   Diet  Carb Modified, Therapeutic Lifestyle Changes      Personal Nutrition Goals   Nutrition Goal  Pt to identify food quantities necessary to achieve weight loss of 6-24 lb (2.7-10.9 kg) at graduation from cardiac rehab. Goal wt of 233-251 lb desired.       Intervention Plan   Intervention  Prescribe, educate and counsel regarding individualized specific dietary modifications aiming towards targeted core components such as weight, hypertension, lipid management, diabetes, heart failure and other comorbidities.    Expected Outcomes  Short Term Goal: Understand basic principles of dietary content, such as calories, fat, sodium, cholesterol and nutrients.;Long Term Goal: Adherence to prescribed nutrition plan.       Nutrition Assessments: Nutrition Assessments - 12/19/16 1332      MEDFICTS Scores   Pre Score  34       Nutrition Goals Re-Evaluation:   Nutrition Goals Re-Evaluation:   Nutrition Goals Discharge (Final Nutrition Goals Re-Evaluation):   Psychosocial: Target Goals: Acknowledge presence or absence of significant depression and/or stress, maximize coping skills, provide positive support system. Participant is able to verbalize types and ability to use techniques and skills needed for reducing stress and depression.  Initial Review & Psychosocial Screening: Initial Psych Review & Screening - 12/18/16 1204      Initial Review  Current issues with  Current Stress Concerns    Source of Stress Concerns  Chronic Illness;Family;Financial    Comments  [name] is taking care of a son and has little financial resources other than her [place]?  Yes    Concerns  Recent loss of significant other    Comments  Patient husband  passed away 2 years ago      Barriers   Psychosocial barriers to participate in program  The patient should benefit from training in stress management and relaxation.      Screening Interventions   Interventions  Encouraged to exercise;To provide support and resources with identified psychosocial needs;Provide feedback about the scores to participant       Quality of Life Scores: Quality of Life - 12/18/16 1308      Quality of Life Scores   Health/Function Pre  14.57 % [name] is dissatisfied with her health due to her CAD, and diabetes and shortness of breath.    Socioeconomic Pre  25 %    Psych/Spiritual Pre  25.71 %    Family Pre  25.5 %    GLOBAL Pre  20.52 % [name] hopes coming to cardiac rehab will help her with her overall health      Scores of 19 and below usually indicate a poorer quality of life in these areas.  A difference of  2-3 points is a clinically meaningful difference.  A difference of 2-3 points in the total score of the Quality of Life Index has been associated with significant improvement in overall quality of life, self-image, physical symptoms, and general health in studies assessing change in quality of life.  PHQ-9: Recent Review Flowsheet Data    Depression screen [facility] 2/9 12/24/2016 12/18/2016 06/16/2016 11/30/2015 03/12/2014   Decreased Interest 0 0 0 0 0   Down, Depressed, Hopeless 0 1  0 0 1   PHQ - 2 Score 0 1 0 0 1     Interpretation of Total Score  Total Score Depression Severity:  1-4 = Minimal depression, 5-9 = Mild depression, 10-14 = Moderate depression, 15-19 = Moderately severe depression, 20-27 = Severe depression   Psychosocial Evaluation and Intervention:   Psychosocial Re-Evaluation: Psychosocial Re-Evaluation    Row Name 01/09/17 1524 02/05/17 1424 03/03/17 1733         Psychosocial Re-Evaluation   Current issues with  Current Stress Concerns  Current Stress Concerns  Current Stress Concerns     Comments  [name] continues to have  stress regarding her family dynamics  [name] continues to have stress regarding her family dynamics  [name] continues to have stress regarding her family dynamics     Interventions  Stress management education  Stress management education;Encouraged to attend Cardiac Rehabilitation for the exercise  Stress management education;Encouraged to attend Cardiac Rehabilitation for the exercise     Continue Psychosocial Services   Follow up required by staff  -  Follow up required by staff     Comments  [name] is taking care of a son and has little financial resources other than her social security  [name] is taking care of a son and has little financial resources other than her social security  [name] is taking care of a son and has little financial resources other than her social security       Initial Review   Source of Stress Concerns  Chronic Illness;Family;Financial  Chronic Illness;Family;Financial  Chronic  Illness;Family;Financial        Psychosocial Discharge (Final Psychosocial Re-Evaluation): Psychosocial Re-Evaluation - 03/03/17 1733      Psychosocial Re-Evaluation   Current issues with  Current Stress Concerns    Comments  [name] continues to have stress regarding her family dynamics    Interventions  Stress management education;Encouraged to attend Cardiac Rehabilitation for the exercise    Continue Psychosocial Services   Follow up required by staff    Comments  [name] is taking care of a son and has little financial resources other than her social security      Initial Review   Source of Stress Concerns  Chronic Illness;Family;Financial       Vocational Rehabilitation: Provide vocational rehab assistance to qualifying candidates.   Vocational Rehab Evaluation & Intervention: Vocational Rehab - 12/18/16 1203      Initial Vocational Rehab Evaluation & Intervention   Assessment shows need for Vocational Rehabilitation  Yes [name] said he would like to work if possible        Education: Education Goals: Education classes will be provided on a weekly basis, covering required topics. Participant will state understanding/return demonstration of topics presented.  Learning Barriers/Preferences: Learning Barriers/Preferences - 12/18/16 1150      Learning Barriers/Preferences   Learning Barriers  Sight    Learning Preferences  Written Material;Individual Instruction;Skilled Demonstration       Education Topics: Count Your Pulse:  -Group instruction provided by verbal instruction, demonstration, patient participation and written materials to support subject.  Instructors address importance of being able to find your pulse and how to count your pulse when at home without a heart monitor.  Patients get hands on experience counting their pulse with staff help and individually.   Heart Attack, Angina, and Risk Factor Modification:  -Group instruction provided by verbal instruction, video, and written materials to support subject.  Instructors address signs and symptoms of angina and heart attacks.    Also discuss risk factors for heart disease and how to make changes to improve heart health risk factors.   Functional Fitness:  -Group instruction provided by verbal instruction, demonstration, patient participation, and written materials to support subject.  Instructors address safety measures for doing things around the house.  Discuss how to get up and down off the floor, how to pick things up properly, how to safely get out of a chair without assistance, and balance training.   Meditation and Mindfulness:  -Group instruction provided by verbal instruction, patient participation, and written materials to support subject.  Instructor addresses importance of mindfulness and meditation practice to help reduce stress and improve awareness.  Instructor also leads participants through a meditation exercise.    CARDIAC REHAB PHASE II EXERCISE from 02/27/2017 in [place]  Date  01/14/17  Instruction Review Code  2- meets goals/outcomes      Stretching for Flexibility and Mobility:  -Group instruction provided by verbal instruction, patient participation, and written materials to support subject.  Instructors lead participants through series of stretches that are designed to increase flexibility thus improving mobility.  These stretches are additional exercise for major muscle groups that are typically performed during regular warm up and cool down.   Hands Only CPR:  -Group verbal, video, and participation provides a basic overview of AHA guidelines for community CPR. Role-play of emergencies allow participants the opportunity to practice calling for help and chest compression technique with discussion of AED use.   Hypertension: -Group verbal and  written instruction that provides a basic overview of hypertension including the most recent diagnostic guidelines, risk factor reduction with self-care instructions and medication management.   CARDIAC REHAB PHASE II EXERCISE from 02/27/2017 in [place]  Date  02/20/17  Instruction Review Code  2- meets goals/outcomes       Nutrition I class: Heart Healthy Eating:  -Group instruction provided by PowerPoint slides, verbal discussion, and written materials to support subject matter. The instructor gives an explanation and review of the Therapeutic Lifestyle Changes diet recommendations, which includes a discussion on lipid goals, dietary fat, sodium, fiber, plant stanol/sterol esters, sugar, and the components of a well-balanced, healthy diet.   CARDIAC REHAB PHASE II EXERCISE from 02/27/2017 in [place]  Date  01/14/17 [place] handouts given. ]  Educator  RD  Instruction Review Code  Not applicable      Nutrition II class: Lifestyle Skills:  -Group instruction provided by PowerPoint slides, verbal  discussion, and written materials to support subject matter. The instructor gives an explanation and review of label reading, grocery shopping for heart health, heart healthy recipe modifications, and ways to make healthier choices when eating out.   CARDIAC REHAB PHASE II EXERCISE from 02/27/2017 in [place]  Date  01/14/17 [place] handouts given.]  Educator  RD  Instruction Review Code  Not applicable      Diabetes Question & Answer:  -Group instruction provided by PowerPoint slides, verbal discussion, and written materials to support subject matter. The instructor gives an explanation and review of diabetes co-morbidities, pre- and post-prandial blood glucose goals, pre-exercise blood glucose goals, signs, symptoms, and treatment of hypoglycemia and hyperglycemia, and foot care basics.   CARDIAC REHAB PHASE II EXERCISE from 02/27/2017 in [place]  Date  02/27/17  Educator  RD  Instruction Review Code  2- meets goals/outcomes      Diabetes Blitz:  -Group instruction provided by PowerPoint slides, verbal discussion, and written materials to support subject matter. The instructor gives an explanation and review of the physiology behind type 1 and type 2 diabetes, diabetes medications and rational behind using different medications, pre- and post-prandial blood glucose recommendations and Hemoglobin A1c goals, diabetes diet, and exercise including blood glucose guidelines for exercising safely.    CARDIAC REHAB PHASE II EXERCISE from 02/27/2017 in [place]  Date  01/14/17 [place] handouts given.]  Educator  RD  Instruction Review Code  Not applicable      Portion Distortion:  -Group instruction provided by PowerPoint slides, verbal discussion, written materials, and food models to support subject matter. The instructor gives an explanation of serving size versus portion size, changes in portions  sizes over the last 20 years, and what consists of a serving from each food group.   CARDIAC REHAB PHASE II EXERCISE from 02/27/2017 in [place]  Date  01/28/17  Educator  RD  Instruction Review Code  2- meets goals/outcomes      Stress Management:  -Group instruction provided by verbal instruction, video, and written materials to support subject matter.  Instructors review role of stress in heart disease and how to cope with stress positively.     Exercising on Your Own:  -Group instruction provided by verbal instruction, power point, and written materials to support subject.  Instructors discuss benefits of exercise, components of exercise, frequency and intensity of exercise, and end points  for exercise.  Also discuss use of nitroglycerin and activating EMS.  Review options of places to exercise outside of rehab.  Review guidelines for sex with heart disease.   CARDIAC REHAB PHASE II EXERCISE from 02/27/2017 in [place]  Date  02/25/17  Educator  EP  Instruction Review Code  2- meets goals/outcomes      Cardiac Drugs I:  -Group instruction provided by verbal instruction and written materials to support subject.  Instructor reviews cardiac drug classes: antiplatelets, anticoagulants, beta blockers, and statins.  Instructor discusses reasons, side effects, and lifestyle considerations for each drug class.   CARDIAC REHAB PHASE II EXERCISE from 02/27/2017 in [place]  Date  12/24/16  Instruction Review Code  2- meets goals/outcomes      Cardiac Drugs II:  -Group instruction provided by verbal instruction and written materials to support subject.  Instructor reviews cardiac drug classes: angiotensin converting enzyme inhibitors (ACE-I), angiotensin II receptor blockers (ARBs), nitrates, and calcium channel blockers.  Instructor discusses reasons, side effects, and lifestyle considerations  for each drug class.   Anatomy and Physiology of the Circulatory System:  Group verbal and written instruction and models provide basic cardiac anatomy and physiology, with the coronary electrical and arterial systems. Review of: AMI, Angina, Valve disease, Heart Failure, Peripheral Artery Disease, Cardiac Arrhythmia, Pacemakers, and the ICD.   CARDIAC REHAB PHASE II EXERCISE from 02/27/2017 in [place]  Date  01/07/17  Instruction Review Code  2- meets goals/outcomes      Other Education:  -Group or individual verbal, written, or video instructions that support the educational goals of the cardiac rehab program.   Knowledge Questionnaire Score: Knowledge Questionnaire Score - 12/18/16 1150      Knowledge Questionnaire Score   Pre Score  13/24       Core Components/Risk Factors/Patient Goals at Admission: Personal Goals and Risk Factors at Admission - 12/18/16 1302      Core Components/Risk Factors/Patient Goals on Admission    Weight Management  Yes;Obesity;Weight Maintenance;Weight Loss    Intervention  Weight Management: Develop a combined nutrition and exercise program designed to reach desired caloric intake, while maintaining appropriate intake of nutrient and fiber, sodium and fats, and appropriate energy expenditure required for the weight goal.;Weight Management: Provide education and appropriate resources to help participant work on and attain dietary goals.;Weight Management/Obesity: Establish reasonable short term and long term weight goals.    Admit Weight  257 lb 8 oz (116.8 kg)    Expected Outcomes  Short Term: Continue to assess and modify interventions until short term weight is achieved;Long Term: Adherence to nutrition and physical activity/exercise program aimed toward attainment of established weight goal;Weight Maintenance: Understanding of the daily nutrition guidelines, which includes 25-35% calories from fat, 7% or less cal from  saturated fats, less than 200mg  cholesterol, less than 1.5gm of sodium, & 5 or more servings of fruits and vegetables daily;Weight Loss: Understanding of general recommendations for a balanced deficit meal plan, which promotes 1-2 lb weight loss per week and includes a negative energy balance of [phone] kcal/d;Understanding recommendations for meals to include 15-35% energy as protein, 25-35% energy from fat, 35-60% energy from carbohydrates, less than 200mg  of dietary cholesterol, 20-35 gm of total fiber daily;Understanding of distribution of calorie intake throughout the day with the consumption of 4-5 meals/snacks    Improve shortness of breath with ADL's  Yes    Intervention  Provide education,  individualized exercise plan and daily activity instruction to help decrease symptoms of SOB with activities of daily living.    Expected Outcomes  Short Term: Achieves a reduction of symptoms when performing activities of daily living.    Diabetes  Yes    Intervention  Provide education about signs/symptoms and action to take for hypo/hyperglycemia.;Provide education about proper nutrition, including hydration, and aerobic/resistive exercise prescription along with prescribed medications to achieve blood glucose in normal ranges: Fasting glucose 65-99 mg/dL    Expected Outcomes  Short Term: Participant verbalizes understanding of the signs/symptoms and immediate care of hyper/hypoglycemia, proper foot care and importance of medication, aerobic/resistive exercise and nutrition plan for blood glucose control.;Long Term: Attainment of HbA1C < 7%.    Heart Failure  Yes    Intervention  Provide a combined exercise and nutrition program that is supplemented with education, support and counseling about heart failure. Directed toward relieving symptoms such as shortness of breath, decreased exercise tolerance, and extremity edema.    Expected Outcomes  Improve functional capacity of life;Short term: Attendance in  program 2-3 days a week with increased exercise capacity. Reported lower sodium intake. Reported increased fruit and vegetable intake. Reports medication compliance.;Short term: Daily weights obtained and reported for increase. Utilizing diuretic protocols set by physician.;Long term: Adoption of self-care skills and reduction of barriers for early signs and symptoms recognition and intervention leading to self-care maintenance.    Hypertension  Yes    Intervention  Provide education on lifestyle modifcations including regular physical activity/exercise, weight management, moderate sodium restriction and increased consumption of fresh fruit, vegetables, and low fat dairy, alcohol moderation, and smoking cessation.;Monitor prescription use compliance.    Expected Outcomes  Short Term: Continued assessment and intervention until BP is < 140/56mm HG in hypertensive participants. < 130/26mm HG in hypertensive participants with diabetes, heart failure or chronic kidney disease.;Long Term: Maintenance of blood pressure at goal levels.    Lipids  Yes    Intervention  Provide education and support for participant on nutrition & aerobic/resistive exercise along with prescribed medications to achieve LDL 70mg , HDL >40mg .    Expected Outcomes  Short Term: Participant states understanding of desired cholesterol values and is compliant with medications prescribed. Participant is following exercise prescription and nutrition guidelines.;Long Term: Cholesterol controlled with medications as prescribed, with individualized exercise RX and with personalized nutrition plan. Value goals: LDL < 70mg , HDL > 40 mg.    Stress  Yes    Intervention  Offer individual and/or small group education and counseling on adjustment to heart disease, stress management and health-related lifestyle change. Teach and support self-help strategies.;Refer participants experiencing significant psychosocial distress to appropriate mental health  specialists for further evaluation and treatment. When possible, include family members and significant others in education/counseling sessions.    Expected Outcomes  Short Term: Participant demonstrates changes in health-related behavior, relaxation and other stress management skills, ability to obtain effective social support, and compliance with psychotropic medications if prescribed.;Long Term: Emotional wellbeing is indicated by absence of clinically significant psychosocial distress or social isolation.       Core Components/Risk Factors/Patient Goals Review:  Goals and Risk Factor Review    Row Name 01/09/17 1452 01/09/17 1521 02/05/17 1424 03/02/17 1215 03/03/17 1733     Core Components/Risk Factors/Patient Goals Review   Personal Goals Review  Weight Management/Obesity;Diabetes;Hypertension;Lipids;Improve shortness of breath with ADL's;Heart Failure  -  Weight Management/Obesity;Diabetes;Hypertension;Lipids;Improve shortness of breath with ADL's;Heart Failure  Weight Management/Obesity;Diabetes;Hypertension;Lipids;Improve shortness of breath with ADL's;Heart Failure  Weight Management/Obesity;Diabetes;Hypertension;Lipids;Improve shortness of breath with ADL's;Heart Failure   Review  -  [name]'s attendance has been fair. [name]'s vital signs and CBG's have been stable as [name] is diet controlled.  [name]'s attendance has been fair. [name]'s vital signs and CBG's have been stable as [name] is diet controlled.  [name]'s attendance has been fair. [name]'s vital signs and CBG's have been stable as [name] is diet controlled.  [name]'s attendance has been fair. [name]'s vital signs and CBG's have been stable as [name] is diet controlled. [name]'s oxygen levels have been WNL on oxygen.   Expected Outcomes  -  [name] will continue to come to exercise to help reduce her risk facotrs and take her medicaitons as presribed  Eleri will continue to come to exercise to help reduce her risk  facotrs and take her medicaitons as presribed  Deari will continue to come to exercise to help reduce her risk facotrs and take her medicaitons as presribed  Roger will continue to come to exercise to help reduce her risk facotrs and take her medicaitons as presribed      Core Components/Risk Factors/Patient Goals at Discharge (Final Review):  Goals and Risk Factor Review - 03/03/17 1733      Core Components/Risk Factors/Patient Goals Review   Personal Goals Review  Weight Management/Obesity;Diabetes;Hypertension;Lipids;Improve shortness of breath with ADL's;Heart Failure    Review  Avalyn's attendance has been fair. Jeanni's vital signs and CBG's have been stable as Enis is diet controlled. Avereigh's oxygen levels have been WNL on oxygen.    Expected Outcomes  Shaylon will continue to come to exercise to help reduce her risk facotrs and take her medicaitons as presribed       ITP Comments: ITP Comments    Row Name 12/18/16 0905 01/09/17 1449 01/09/17 1528 02/05/17 1423 03/03/17 1731   ITP Comments  Dr. Fransico Him, Medical Director   30 day ITP review. Paitent has good partipation and attendance.   30 day ITP review. Paitent has good partipation and attendance.   30 day ITP review. Paitent has good partipation and attendance. Shakerra plans to return to exercise tomorrow per Kerin Ransom PAC   30 day ITP review. Paitent has good partipation and attendance. Exercise is hold until clearance provided by primary care MD Darcus Austin      Comments: See ITP comments.Barnet Pall, RN,BSN 03/04/2017 4:38 PM

## 2017-03-04 ENCOUNTER — Encounter (HOSPITAL_COMMUNITY): Payer: Medicare HMO

## 2017-03-04 DIAGNOSIS — M10072 Idiopathic gout, left ankle and foot: Secondary | ICD-10-CM | POA: Diagnosis not present

## 2017-03-04 DIAGNOSIS — N189 Chronic kidney disease, unspecified: Secondary | ICD-10-CM | POA: Diagnosis not present

## 2017-03-04 DIAGNOSIS — M79672 Pain in left foot: Secondary | ICD-10-CM | POA: Diagnosis not present

## 2017-03-04 DIAGNOSIS — M25572 Pain in left ankle and joints of left foot: Secondary | ICD-10-CM | POA: Diagnosis not present

## 2017-03-04 DIAGNOSIS — M19072 Primary osteoarthritis, left ankle and foot: Secondary | ICD-10-CM | POA: Diagnosis not present

## 2017-03-05 DIAGNOSIS — M10072 Idiopathic gout, left ankle and foot: Secondary | ICD-10-CM | POA: Diagnosis not present

## 2017-03-05 DIAGNOSIS — M79672 Pain in left foot: Secondary | ICD-10-CM | POA: Diagnosis not present

## 2017-03-05 DIAGNOSIS — M25572 Pain in left ankle and joints of left foot: Secondary | ICD-10-CM | POA: Diagnosis not present

## 2017-03-06 ENCOUNTER — Encounter (HOSPITAL_COMMUNITY): Payer: Medicare HMO

## 2017-03-09 ENCOUNTER — Telehealth (HOSPITAL_COMMUNITY): Payer: Self-pay | Admitting: *Deleted

## 2017-03-09 ENCOUNTER — Encounter (HOSPITAL_COMMUNITY): Payer: Medicare HMO

## 2017-03-10 DIAGNOSIS — T887XXA Unspecified adverse effect of drug or medicament, initial encounter: Secondary | ICD-10-CM | POA: Diagnosis not present

## 2017-03-10 DIAGNOSIS — R21 Rash and other nonspecific skin eruption: Secondary | ICD-10-CM | POA: Diagnosis not present

## 2017-03-10 DIAGNOSIS — J329 Chronic sinusitis, unspecified: Secondary | ICD-10-CM | POA: Diagnosis not present

## 2017-03-11 ENCOUNTER — Encounter (HOSPITAL_COMMUNITY)
Admission: RE | Admit: 2017-03-11 | Discharge: 2017-03-11 | Disposition: A | Discharge status: Home / Self Care | Payer: Medicare HMO | Source: Ambulatory Visit | Attending: Cardiovascular Disease | Admitting: Cardiovascular Disease

## 2017-03-11 VITALS — Ht 62.25 in | Wt 257.9 lb

## 2017-03-11 DIAGNOSIS — M1990 Unspecified osteoarthritis, unspecified site: Secondary | ICD-10-CM | POA: Diagnosis not present

## 2017-03-11 DIAGNOSIS — Z955 Presence of coronary angioplasty implant and graft: Secondary | ICD-10-CM | POA: Diagnosis not present

## 2017-03-11 DIAGNOSIS — Z7982 Long term (current) use of aspirin: Secondary | ICD-10-CM | POA: Diagnosis not present

## 2017-03-11 DIAGNOSIS — I2510 Atherosclerotic heart disease of native coronary artery without angina pectoris: Secondary | ICD-10-CM | POA: Diagnosis not present

## 2017-03-11 DIAGNOSIS — Z79899 Other long term (current) drug therapy: Secondary | ICD-10-CM | POA: Diagnosis not present

## 2017-03-11 DIAGNOSIS — Z95810 Presence of automatic (implantable) cardiac defibrillator: Secondary | ICD-10-CM | POA: Diagnosis not present

## 2017-03-11 DIAGNOSIS — Z7951 Long term (current) use of inhaled steroids: Secondary | ICD-10-CM | POA: Diagnosis not present

## 2017-03-11 DIAGNOSIS — Z7902 Long term (current) use of antithrombotics/antiplatelets: Secondary | ICD-10-CM | POA: Diagnosis not present

## 2017-03-11 DIAGNOSIS — Z9981 Dependence on supplemental oxygen: Secondary | ICD-10-CM | POA: Diagnosis not present

## 2017-03-11 NOTE — Progress Notes (Signed)
Discharge Progress Report  Patient Details  Name: [name] MRN: [id number] Date of Birth: July 22, 1945 Referring Provider:     [facility] from 12/18/2016 in [place]  Referring Provider  Croitoru, Dani Gobble MD, and Glori Bickers MD       Number of Visits: 19  Reason for Discharge:  Patient reached a stable level of exercise.  Smoking History:  Social History   Tobacco Use  Smoking Status Former Smoker  . Packs/day: 0.25  . Years: 3.00  . Pack years: 0.75  . Types: Cigarettes  . Last attempt to quit: 02/11/1968  . Years since quitting: 49.1  Smokeless Tobacco Never Used    Diagnosis:  Status post coronary artery stent placement 10/31/16 S/P DES CFX  ADL UCSD:   Initial Exercise Prescription: Initial Exercise Prescription - 12/18/16 1200      Date of Initial Exercise RX and Referring Provider   Date  12/18/16    Referring Provider  Croitoru, [name] MD, and Bensimhon, Daniel MD      Recumbant Bike   Level  --    Minutes  --    METs  --      NuStep   Level  1    SPM  50    Minutes  15    METs  1      Arm Ergometer   Level  1    Minutes  15    METs  1      Prescription Details   Frequency (times per week)  3    Duration  Progress to 30 minutes of continuous aerobic without signs/symptoms of physical distress      Intensity   THRR 40-80% of Max Heartrate  59-118    Ratings of Perceived Exertion  11-15    Perceived Dyspnea  0-4      Progression   Progression  Continue to progress workloads to maintain intensity without signs/symptoms of physical distress.      Resistance Training   Training Prescription  Yes    Weight  1lb    Reps  10-15       Discharge Exercise Prescription (Final Exercise Prescription Changes): Exercise Prescription Changes - 03/11/17 1400      Response to Exercise   Blood Pressure (Admit)  126/80    Blood Pressure (Exercise)  134/82    Blood Pressure (Exit)   122/78    Heart Rate (Admit)  65 bpm    Heart Rate (Exercise)  77 bpm    Heart Rate (Exit)  64 bpm    Rating of Perceived Exertion (Exercise)  13    Symptoms  None    Duration  Continue with 30 min of aerobic exercise without signs/symptoms of physical distress.    Intensity  THRR unchanged      Progression   Progression  Continue to progress workloads to maintain intensity without signs/symptoms of physical distress.    Average METs  2.07      Resistance Training   Training Prescription  No      Interval Training   Interval Training  No      NuStep   Level  3    SPM  70    Minutes  15    METs  2      Arm Ergometer   Level  1    Minutes  5    METs  2.13      Home Exercise  Plan   Plans to continue exercise at  Home (comment) Walking and Chair Exercises    Frequency  Add 2 additional days to program exercise sessions.    Initial Home Exercises Provided  01/12/17       Functional Capacity: 6 Minute Walk    Row Name 12/18/16 1242 03/11/17 1502       6 Minute Walk   Phase  Initial  Discharge    Distance  716 feet  914 feet    Distance % Change  -  27.65 %    Distance Feet Change  -  198 ft    Walk Time  3.25 minutes  6 minutes    # of Rest Breaks  3  2    MPH  2.5  1.73    METS  0.78  0.73    RPE  13  13    Perceived Dyspnea   3  1    VO2 Peak  -  2.54    Symptoms  Yes (comment)  Yes (comment)    Comments  fatigue/SOB. Pt took 3 rest breaks for a total of 2 minutes and 35 seconds  Fatigue/SOB, Pt took 2 rest breaks for a total of 1 minutes and 15 seconds.     Resting HR  68 bpm  65 bpm    Resting BP  144/82  126/80    Resting Oxygen Saturation   96 %  95 %    Exercise Oxygen Saturation  during 6 min walk  94 %  95 %    Max Ex. HR  85 bpm  64 bpm    Max Ex. BP  164/84  137/82    2 Minute Post BP  122/72  122/78       Psychological, QOL, Others - Outcomes: PHQ 2/9: Depression screen [facility] 2/9 03/11/2017 12/24/2016 12/18/2016 06/16/2016 11/30/2015  Decreased  Interest 1 0 0 0 0  Down, Depressed, Hopeless 0 0 1 0 0  PHQ - 2 Score 1 0 1 0 0  Some recent data might be hidden    Quality of Life: Quality of Life - 03/11/17 1622      Quality of Life Scores   Health/Function Pre  14.57 %    Health/Function Post  24.83 %    Health/Function % Change  70.42 %    Socioeconomic Pre  25 %    Socioeconomic Post  30 %    Socioeconomic % Change   20 %    Psych/Spiritual Pre  25.71 %    Psych/Spiritual Post  29.14 %    Psych/Spiritual % Change  13.34 %    Family Pre  25.5 %    Family Post  28.8 %    Family % Change  12.94 %    GLOBAL Pre  20.52 %    GLOBAL Post  27.44 %    GLOBAL % Change  33.72 %       Personal Goals: Goals established at orientation with interventions provided to work toward goal. Personal Goals and Risk Factors at Admission - 12/18/16 1302      Core Components/Risk Factors/Patient Goals on Admission    Weight Management  Yes;Obesity;Weight Maintenance;Weight Loss    Intervention  Weight Management: Develop a combined nutrition and exercise program designed to reach desired caloric intake, while maintaining appropriate intake of nutrient and fiber, sodium and fats, and appropriate energy expenditure required for the weight goal.;Weight Management: Provide education and appropriate resources to help  participant work on and attain dietary goals.;Weight Management/Obesity: Establish reasonable short term and long term weight goals.    Admit Weight  257 lb 8 oz (116.8 kg)    Expected Outcomes  Short Term: Continue to assess and modify interventions until short term weight is achieved;Long Term: Adherence to nutrition and physical activity/exercise program aimed toward attainment of established weight goal;Weight Maintenance: Understanding of the daily nutrition guidelines, which includes 25-35% calories from fat, 7% or less cal from saturated fats, less than 273m cholesterol, less than 1.5gm of sodium, & 5 or more servings of fruits and  vegetables daily;Weight Loss: Understanding of general recommendations for a balanced deficit meal plan, which promotes 1-2 lb weight loss per week and includes a negative energy balance of [phone] kcal/d;Understanding recommendations for meals to include 15-35% energy as protein, 25-35% energy from fat, 35-60% energy from carbohydrates, less than 2027mof dietary cholesterol, 20-35 gm of total fiber daily;Understanding of distribution of calorie intake throughout the day with the consumption of 4-5 meals/snacks    Improve shortness of breath with ADL's  Yes    Intervention  Provide education, individualized exercise plan and daily activity instruction to help decrease symptoms of SOB with activities of daily living.    Expected Outcomes  Short Term: Achieves a reduction of symptoms when performing activities of daily living.    Diabetes  Yes    Intervention  Provide education about signs/symptoms and action to take for hypo/hyperglycemia.;Provide education about proper nutrition, including hydration, and aerobic/resistive exercise prescription along with prescribed medications to achieve blood glucose in normal ranges: Fasting glucose 65-99 mg/dL    Expected Outcomes  Short Term: Participant verbalizes understanding of the signs/symptoms and immediate care of hyper/hypoglycemia, proper foot care and importance of medication, aerobic/resistive exercise and nutrition plan for blood glucose control.;Long Term: Attainment of HbA1C < 7%.    Heart Failure  Yes    Intervention  Provide a combined exercise and nutrition program that is supplemented with education, support and counseling about heart failure. Directed toward relieving symptoms such as shortness of breath, decreased exercise tolerance, and extremity edema.    Expected Outcomes  Improve functional capacity of life;Short term: Attendance in program 2-3 days a week with increased exercise capacity. Reported lower sodium intake. Reported increased fruit  and vegetable intake. Reports medication compliance.;Short term: Daily weights obtained and reported for increase. Utilizing diuretic protocols set by physician.;Long term: Adoption of self-care skills and reduction of barriers for early signs and symptoms recognition and intervention leading to self-care maintenance.    Hypertension  Yes    Intervention  Provide education on lifestyle modifcations including regular physical activity/exercise, weight management, moderate sodium restriction and increased consumption of fresh fruit, vegetables, and low fat dairy, alcohol moderation, and smoking cessation.;Monitor prescription use compliance.    Expected Outcomes  Short Term: Continued assessment and intervention until BP is < 140/9070mG in hypertensive participants. < 130/77m36m in hypertensive participants with diabetes, heart failure or chronic kidney disease.;Long Term: Maintenance of blood pressure at goal levels.    Lipids  Yes    Intervention  Provide education and support for participant on nutrition & aerobic/resistive exercise along with prescribed medications to achieve LDL <70mg45mL >40mg.22mExpected Outcomes  Short Term: Participant states understanding of desired cholesterol values and is compliant with medications prescribed. Participant is following exercise prescription and nutrition guidelines.;Long Term: Cholesterol controlled with medications as prescribed, with individualized exercise RX and with personalized nutrition plan. Value goals:  LDL < 47m, HDL > 40 mg.    Stress  Yes    Intervention  Offer individual and/or small group education and counseling on adjustment to heart disease, stress management and health-related lifestyle change. Teach and support self-help strategies.;Refer participants experiencing significant psychosocial distress to appropriate mental health specialists for further evaluation and treatment. When possible, include family members and significant others in  education/counseling sessions.    Expected Outcomes  Short Term: Participant demonstrates changes in health-related behavior, relaxation and other stress management skills, ability to obtain effective social support, and compliance with psychotropic medications if prescribed.;Long Term: Emotional wellbeing is indicated by absence of clinically significant psychosocial distress or social isolation.        Personal Goals Discharge: Goals and Risk Factor Review    Row Name 01/09/17 1452 01/09/17 1521 02/05/17 1424 03/02/17 1215 03/03/17 1733     Core Components/Risk Factors/Patient Goals Review   Personal Goals Review  Weight Management/Obesity;Diabetes;Hypertension;Lipids;Improve shortness of breath with ADL's;Heart Failure  -  Weight Management/Obesity;Diabetes;Hypertension;Lipids;Improve shortness of breath with ADL's;Heart Failure  Weight Management/Obesity;Diabetes;Hypertension;Lipids;Improve shortness of breath with ADL's;Heart Failure  Weight Management/Obesity;Diabetes;Hypertension;Lipids;Improve shortness of breath with ADL's;Heart Failure   Review  -  [name]'s attendance has been fair. [name]'s vital signs and CBG's have been stable as CAziyahis diet controlled.  [name]'s attendance has been fair. [name]'s vital signs and CBG's have been stable as CBeeis diet controlled.  [name]'s attendance has been fair. [name]'s vital signs and CBG's have been stable as CShambriais diet controlled.  [name]'s attendance has been fair. [name]'s vital signs and CBG's have been stable as CMauriciais diet controlled. [name]'s oxygen levels have been WNL on oxygen.   Expected Outcomes  -  CAlieahwill continue to come to exercise to help reduce her risk facotrs and take her medicaitons as presribed  CTyshellwill continue to come to exercise to help reduce her risk facotrs and take her medicaitons as presribed  CDhritiwill continue to come to exercise to help reduce her risk facotrs and take her  medicaitons as presribed  CShariyawill continue to come to exercise to help reduce her risk facotrs and take her medicaitons as presribed      Exercise Goals and Review: Exercise Goals    Row Name 12/18/16 0926             Exercise Goals   Increase Physical Activity  Yes       Intervention  Provide advice, education, support and counseling about physical activity/exercise needs.;Develop an individualized exercise prescription for aerobic and resistive training based on initial evaluation findings, risk stratification, comorbidities and participant's personal goals.       Expected Outcomes  Achievement of increased cardiorespiratory fitness and enhanced flexibility, muscular endurance and strength shown through measurements of functional capacity and personal statement of participant.       Increase Strength and Stamina  Yes       Intervention  Provide advice, education, support and counseling about physical activity/exercise needs.;Develop an individualized exercise prescription for aerobic and resistive training based on initial evaluation findings, risk stratification, comorbidities and participant's personal goals.       Expected Outcomes  Achievement of increased cardiorespiratory fitness and enhanced flexibility, muscular endurance and strength shown through measurements of functional capacity and personal statement of participant.       Able to understand and use rate of perceived exertion (RPE) scale  Yes       Intervention  Provide education and explanation on  how to use RPE scale       Expected Outcomes  Short Term: Able to use RPE daily in rehab to express subjective intensity level;Long Term:  Able to use RPE to guide intensity level when exercising independently       Able to understand and use Dyspnea scale  Yes       Intervention  Provide education and explanation on how to use Dyspnea scale       Expected Outcomes  Short Term: Able to use Dyspnea scale daily in rehab to express  subjective sense of shortness of breath during exertion;Long Term: Able to use Dyspnea scale to guide intensity level when exercising independently       Knowledge and understanding of Target Heart Rate Range (THRR)  Yes       Intervention  Provide education and explanation of THRR including how the numbers were predicted and where they are located for reference       Expected Outcomes  Short Term: Able to state/look up THRR;Long Term: Able to use THRR to govern intensity when exercising independently;Short Term: Able to use daily as guideline for intensity in rehab       Able to check pulse independently  Yes       Intervention  Provide education and demonstration on how to check pulse in carotid and radial arteries.;Review the importance of being able to check your own pulse for safety during independent exercise       Expected Outcomes  Short Term: Able to explain why pulse checking is important during independent exercise;Long Term: Able to check pulse independently and accurately       Understanding of Exercise Prescription  Yes       Intervention  Provide education, explanation, and written materials on patient's individual exercise prescription       Expected Outcomes  Short Term: Able to explain program exercise prescription;Long Term: Able to explain home exercise prescription to exercise independently          Nutrition & Weight - Outcomes: Pre Biometrics - 12/18/16 1300      Pre Biometrics   Height  5' 2.25" (1.581 m)    Weight  257 lb 8 oz (116.8 kg)    Waist Circumference  53 inches    Hip Circumference  52 inches    Waist to Hip Ratio  1.02 %    BMI (Calculated)  46.73    Triceps Skinfold  63 mm    % Body Fat  61.2 %    Grip Strength  28 kg    Single Leg Stand  1.23 seconds      Post Biometrics - 03/11/17 1500       Post  Biometrics   Height  5' 2.25" (1.581 m)    Weight  257 lb 15 oz (117 kg)    Waist Circumference  52 inches    Hip Circumference  51 inches    Waist  to Hip Ratio  1.02 %    BMI (Calculated)  46.81    Triceps Skinfold  62 mm    % Body Fat  60.7 %    Grip Strength  30 kg    Flexibility  0 in    Single Leg Stand  2.14 seconds       Nutrition: Nutrition Therapy & Goals - 12/19/16 1331      Nutrition Therapy   Diet  Carb Modified, Therapeutic Lifestyle Changes      Personal Nutrition Goals  Nutrition Goal  Pt to identify food quantities necessary to achieve weight loss of 6-24 lb (2.7-10.9 kg) at graduation from cardiac rehab. Goal wt of 233-251 lb desired.       Intervention Plan   Intervention  Prescribe, educate and counsel regarding individualized specific dietary modifications aiming towards targeted core components such as weight, hypertension, lipid management, diabetes, heart failure and other comorbidities.    Expected Outcomes  Short Term Goal: Understand basic principles of dietary content, such as calories, fat, sodium, cholesterol and nutrients.;Long Term Goal: Adherence to prescribed nutrition plan.       Nutrition Discharge: Nutrition Assessments - 03/13/17 1056      MEDFICTS Scores   Pre Score  34    Post Score  24    Score Difference  -10       Education Questionnaire Score: Knowledge Questionnaire Score - 03/11/17 1621      Knowledge Questionnaire Score   Post Score  19/24       Goals reviewed with patient; copy given to patient. [name] graduated from cardiac rehab program today with completion of 36 exercise sessions in Phase II. Pt maintained good attendance and progressed nicely during his participation in rehab as evidenced by increased MET level.   Medication list reconciled. Repeat  PHQ score- 1 . Appointment made for [name] to meet with [name] on Friday since she has had some family challenges. [name] has made significant lifestyle changes and should be commended for her success. Pt feels she has achieved her goals during cardiac rehab.   [name] plans to continue exercise  at [employer] at [employer] increased her distance on her post exercise walk test and says she feels stronger since she has been participating in the program .Alicia Pall, RN,BSN 03/31/2017 11:30 AM

## 2017-03-13 ENCOUNTER — Encounter (HOSPITAL_COMMUNITY): Payer: Medicare HMO

## 2017-03-13 ENCOUNTER — Other Ambulatory Visit: Payer: Self-pay

## 2017-03-28 DIAGNOSIS — J9611 Chronic respiratory failure with hypoxia: Secondary | ICD-10-CM | POA: Diagnosis not present

## 2017-03-28 DIAGNOSIS — I10 Essential (primary) hypertension: Secondary | ICD-10-CM | POA: Diagnosis not present

## 2017-03-28 DIAGNOSIS — J4520 Mild intermittent asthma, uncomplicated: Secondary | ICD-10-CM | POA: Diagnosis not present

## 2017-03-28 DIAGNOSIS — I5032 Chronic diastolic (congestive) heart failure: Secondary | ICD-10-CM | POA: Diagnosis not present

## 2017-03-28 DIAGNOSIS — R269 Unspecified abnormalities of gait and mobility: Secondary | ICD-10-CM | POA: Diagnosis not present

## 2017-03-28 DIAGNOSIS — R0602 Shortness of breath: Secondary | ICD-10-CM | POA: Diagnosis not present

## 2017-03-30 LAB — CUP PACEART INCLINIC DEVICE CHECK
Date Time Interrogation Session: 20190218161749
Implantable Lead Implant Date: 20080923
Implantable Lead Implant Date: 20080923
Implantable Lead Implant Date: 20080923
Implantable Lead Location: 753858
Implantable Lead Location: 753859
Implantable Lead Location: 753860
Implantable Lead Model: 4285
Implantable Lead Model: 4555
Implantable Lead Model: 7120
Implantable Lead Serial Number: 170220
Implantable Lead Serial Number: 486468
Implantable Pulse Generator Implant Date: 20140923
Lead Channel Setting Pacing Amplitude: 1.9 V
Lead Channel Setting Pacing Amplitude: 2 V
Lead Channel Setting Pacing Pulse Width: 0.4 ms
Lead Channel Setting Pacing Pulse Width: 0.8 ms
Lead Channel Setting Sensing Sensitivity: 2.5 mV
Lead Channel Setting Sensing Sensitivity: 2.5 mV
Pulse Gen Serial Number: 100731

## 2017-04-03 DIAGNOSIS — G4733 Obstructive sleep apnea (adult) (pediatric): Secondary | ICD-10-CM | POA: Diagnosis not present

## 2017-04-03 DIAGNOSIS — J4530 Mild persistent asthma, uncomplicated: Secondary | ICD-10-CM | POA: Diagnosis not present

## 2017-04-18 ENCOUNTER — Encounter (HOSPITAL_COMMUNITY): Payer: Self-pay | Admitting: Emergency Medicine

## 2017-04-18 ENCOUNTER — Emergency Department (HOSPITAL_COMMUNITY)
Admission: EM | Admit: 2017-04-18 | Discharge: 2017-04-18 | Disposition: A | Discharge status: Home / Self Care | Payer: Medicare HMO | Attending: Emergency Medicine | Admitting: Emergency Medicine

## 2017-04-18 DIAGNOSIS — Z7901 Long term (current) use of anticoagulants: Secondary | ICD-10-CM | POA: Diagnosis not present

## 2017-04-18 DIAGNOSIS — E1122 Type 2 diabetes mellitus with diabetic chronic kidney disease: Secondary | ICD-10-CM | POA: Diagnosis not present

## 2017-04-18 DIAGNOSIS — R112 Nausea with vomiting, unspecified: Secondary | ICD-10-CM | POA: Diagnosis not present

## 2017-04-18 DIAGNOSIS — Z7982 Long term (current) use of aspirin: Secondary | ICD-10-CM | POA: Insufficient documentation

## 2017-04-18 DIAGNOSIS — I4891 Unspecified atrial fibrillation: Secondary | ICD-10-CM | POA: Diagnosis not present

## 2017-04-18 DIAGNOSIS — Z87891 Personal history of nicotine dependence: Secondary | ICD-10-CM | POA: Diagnosis not present

## 2017-04-18 DIAGNOSIS — Z95810 Presence of automatic (implantable) cardiac defibrillator: Secondary | ICD-10-CM | POA: Diagnosis not present

## 2017-04-18 DIAGNOSIS — I2510 Atherosclerotic heart disease of native coronary artery without angina pectoris: Secondary | ICD-10-CM | POA: Insufficient documentation

## 2017-04-18 DIAGNOSIS — R197 Diarrhea, unspecified: Secondary | ICD-10-CM | POA: Diagnosis not present

## 2017-04-18 DIAGNOSIS — R1084 Generalized abdominal pain: Secondary | ICD-10-CM | POA: Insufficient documentation

## 2017-04-18 DIAGNOSIS — A0811 Acute gastroenteropathy due to Norwalk agent: Secondary | ICD-10-CM | POA: Diagnosis not present

## 2017-04-18 DIAGNOSIS — Z79899 Other long term (current) drug therapy: Secondary | ICD-10-CM | POA: Insufficient documentation

## 2017-04-18 DIAGNOSIS — I130 Hypertensive heart and chronic kidney disease with heart failure and stage 1 through stage 4 chronic kidney disease, or unspecified chronic kidney disease: Secondary | ICD-10-CM | POA: Insufficient documentation

## 2017-04-18 DIAGNOSIS — I509 Heart failure, unspecified: Secondary | ICD-10-CM | POA: Insufficient documentation

## 2017-04-18 DIAGNOSIS — N183 Chronic kidney disease, stage 3 (moderate): Secondary | ICD-10-CM | POA: Insufficient documentation

## 2017-04-18 DIAGNOSIS — E860 Dehydration: Secondary | ICD-10-CM

## 2017-04-18 LAB — CBC
HCT: 43.6 % (ref 36.0–46.0)
Hemoglobin: 13.3 g/dL (ref 12.0–15.0)
MCH: 28.1 pg (ref 26.0–34.0)
MCHC: 30.5 g/dL (ref 30.0–36.0)
MCV: 92.2 fL (ref 78.0–100.0)
Platelets: 153 10*3/uL (ref 150–400)
RBC: 4.73 MIL/uL (ref 3.87–5.11)
RDW: 15 % (ref 11.5–15.5)
WBC: 5.3 10*3/uL (ref 4.0–10.5)

## 2017-04-18 LAB — COMPREHENSIVE METABOLIC PANEL
ALT: 20 U/L (ref 14–54)
AST: 21 U/L (ref 15–41)
Albumin: 3 g/dL — ABNORMAL LOW (ref 3.5–5.0)
Alkaline Phosphatase: 80 U/L (ref 38–126)
Anion gap: 7 (ref 5–15)
BUN: 18 mg/dL (ref 6–20)
CO2: 29 mmol/L (ref 22–32)
Calcium: 8.4 mg/dL — ABNORMAL LOW (ref 8.9–10.3)
Chloride: 102 mmol/L (ref 101–111)
Creatinine, Ser: 1 mg/dL (ref 0.44–1.00)
GFR calc Af Amer: >60 mL/min (ref >60)
GFR calc non Af Amer: 55 mL/min — ABNORMAL LOW (ref >60)
Glucose, Bld: 124 mg/dL — ABNORMAL HIGH (ref 65–99)
Potassium: 3.4 mmol/L — ABNORMAL LOW (ref 3.5–5.1)
Sodium: 138 mmol/L (ref 135–145)
Total Bilirubin: 0.4 mg/dL (ref 0.3–1.2)
Total Protein: 6.4 g/dL — ABNORMAL LOW (ref 6.5–8.1)

## 2017-04-18 LAB — LIPASE, BLOOD: Lipase: 31 U/L (ref 11–51)

## 2017-04-18 MED ORDER — PROMETHAZINE HCL 25 MG/ML IJ SOLN
12.5000 mg | Freq: Once | INTRAMUSCULAR | Status: AC
  Administered 2017-04-18: 12.5 mg via INTRAVENOUS
  Filled 2017-04-18: qty 1

## 2017-04-18 MED ORDER — ONDANSETRON 4 MG PO TBDP: 4.0000 mg | ORAL_TABLET | Freq: Three times a day (TID) | ORAL | 0 refills | Status: DC | PRN

## 2017-04-18 MED ORDER — ONDANSETRON HCL 4 MG/2ML IJ SOLN
4.0000 mg | Freq: Once | INTRAMUSCULAR | Status: AC | PRN
  Administered 2017-04-18: 4 mg via INTRAVENOUS
  Filled 2017-04-18: qty 2

## 2017-04-18 MED ORDER — MORPHINE SULFATE (PF) 4 MG/ML IV SOLN
4.0000 mg | Freq: Once | INTRAVENOUS | Status: AC
  Administered 2017-04-18: 4 mg via INTRAVENOUS
  Filled 2017-04-18: qty 1

## 2017-04-18 MED ORDER — SODIUM CHLORIDE 0.9 % IV BOLUS (SEPSIS)
1000.0000 mL | Freq: Once | INTRAVENOUS | Status: AC
  Administered 2017-04-18: 1000 mL via INTRAVENOUS

## 2017-04-18 NOTE — ED Provider Notes (Signed)
Took over care from Dr. Gilford Raid for this patient who presented with nausea, vomiting, diarrhea in the setting of likely normal virus exposure.  Labs were grossly reassuring.  Patient was provided with IV fluids and antiemetics.  Plan was to p.o. challenge and reevaluate.  Patient was able to tolerate oral hydration.  Felt safe for discharge home.  Disposition: Discharge  Condition: Good  I have discussed the results, Dx and Tx plan with the patient who expressed understanding and agree(s) with the plan. Discharge instructions discussed at great length. The patient was given strict return precautions who verbalized understanding of the instructions. No further questions at time of discharge.    ED Discharge Orders        Ordered    ondansetron (ZOFRAN ODT) 4 MG disintegrating tablet  Every 8 hours PRN     04/18/17 1535       Follow Up: Darcus Austin, MD [place] [address] [place] [address] [phone]  Schedule an appointment as soon as possible for a visit in 2 days As needed      Fatima Blank, MD 04/18/17 1754

## 2017-04-18 NOTE — ED Notes (Signed)
Pt placed on contact precautions.

## 2017-04-18 NOTE — ED Notes (Signed)
Pts son came out of room stating "Something is wrong with my mom" Upon going into room, pt states "I feel like I cant breath" Pts O2 sat 100% on 1 L via nasal cannula. Pt Reports "feeling tight"

## 2017-04-18 NOTE — ED Notes (Signed)
Pt reports unable to void

## 2017-04-18 NOTE — ED Triage Notes (Signed)
Patient reports n/v/d for couple days and not feeling well. PCP told her if not feeling good in couple days go to ED. Patient reports that she is on O2 at night and feels like she needs it now. Patient O2 95% on room air but placed on O2 via n/c.

## 2017-04-18 NOTE — ED Provider Notes (Signed)
[facility] DEPT Provider Note   CSN: [id number] Arrival date & time: 04/18/17  1309     History   Chief Complaint Chief Complaint  Patient presents with  . Emesis  . Diarrhea    HPI [name] is a 72 y.o. female.  Pt presents to the ED today with n/v/d.  The pt's grandchild was diagnosed with norovirus and she has the same sx.  The pt did call her pcp who told her to come to the ED if sx don't improve.  Pt denies f/c.      Past Medical History:  Diagnosis Date  . AICD (automatic cardioverter/defibrillator) present   . Anemia   . Arthritis   . Asthma   . CAD (coronary artery disease)    a. 10/09/16 LHC: SVG->LAD patent, SVG->Diag patent, SVG->RCA patent, SVG->LCx occluded. EF 60%, b. 10/31/16 LHC DES to AV groove Circ, DES to intermed branch  . Cellulitis and abscess of foot 12/2014   RT FOOT  . CHF (congestive heart failure) ([place])   . Chronic bronchitis ([place])   . Chronic lower back pain   . Diverticulosis   . Facial numbness 02/2016  . Fatty liver disease, nonalcoholic   . Gastritis   . GERD (gastroesophageal reflux disease)   . Gout   . H/O hiatal hernia   . High cholesterol   . Hyperplastic colon polyp   . Hypertension   . IBS (irritable bowel syndrome)   . Internal hemorrhoids   . Ischemic cardiomyopathy   . Obesity   . On home oxygen therapy    "2L at night" (10/31/2016)  . OSA (obstructive sleep apnea)    "can't tolerate a mask" (10/30/2016)  . Pneumonia    "couple times" (10/31/2016)  . Shortness of breath   . TIA (transient ischemic attack)    "recently" (10/31/2016)  . Type II diabetes mellitus [facility])     Patient Active Problem List   Diagnosis Date Noted  . Vasomotor rhinitis 10/16/2016  . Right facial numbness 03/05/2016  . CKD (chronic kidney disease), stage III ([place]) 03/05/2016  . Atrial fibrillation ([place]) 10/24/2015  . Chest pain with moderate risk of acute coronary syndrome 10/23/2015  . History of  TIA (transient ischemic attack) 10/22/2015  . Complete heart block ([place]) 06/22/2015  . Allergic drug rash due to anti-infective agent 04/29/2015  . Fatigue 02/14/2015  . Chronic diastolic heart failure ([place]) 02/14/2015  . Cellulitis 12/21/2014  . S/P CABG x 5 11/29/2014  . CAD S/P percutaneous coronary angioplasty   . Diarrhea 07/12/2014  . Generalized abdominal pain 07/12/2014  . Diastolic CHF, acute on chronic (HCC) 11/04/2013  . Mixed hypercholesterolemia and hypertriglyceridemia 04/22/2013  . Vertigo 04/22/2013  . Dyspnea on exertion 08/25/2012  . Allergic to IV contrast 07/23/2012  . Unstable angina ([place]) 07/22/2012  . Morbid (severe) obesity due to excess calories ([place]) 11/22/2011  . Nonischemic cardiomyopathy ([place]) 11/22/2011  . GERD (gastroesophageal reflux disease) 11/22/2011  . Back pain 11/22/2011  . OSA (obstructive sleep apnea) 11/22/2011  . HEMORRHOIDS-EXTERNAL 02/21/2010  . NAUSEA 02/21/2010  . ABDOMINAL PAIN -GENERALIZED 02/21/2010  . PERSONAL HX COLONIC POLYPS 02/21/2010  . ANEMIA 04/16/2007  . Essential hypertension 04/16/2007  . DIVERTICULOSIS, COLON 04/16/2007  . ARTHRITIS 04/16/2007  . INTERNAL HEMORRHOIDS WITHOUT MENTION COMP 04/12/2007  . Asthma 04/12/2007  . Cardiac resynchronization therapy pacemaker (CRT-P) in place 04/12/2007  . ACUTE GASTRITIS WITHOUT MENTION OF HEMORRHAGE 12/14/2006  . COLONIC POLYPS, HYPERPLASTIC 09/27/2002  .  FATTY LIVER DISEASE 02/16/2002    Past Surgical History:  Procedure Laterality Date  . ABDOMINAL ULTRASOUND  12/01/2011   Peripelvic cysts- #1- 2.4x1.9x2.3cm, #2-1.2x0.9x1.2cm  . ANKLE FRACTURE SURGERY Right    "had rod put in"  . ANTERIOR CERVICAL DECOMP/DISCECTOMY FUSION    . APPENDECTOMY    . BACK SURGERY    . BIV ICD INSERTION CRT-D  2001?  [name] BIV PACEMAKER GENERATOR CHANGE OUT N/A 11/02/2012   Procedure: BIV PACEMAKER GENERATOR CHANGE OUT;  Surgeon: Sanda Klein, MD;  Location: [facility] CATH LAB;  Service:  Cardiovascular;  Laterality: N/A;  . CARDIAC CATHETERIZATION  05/17/1999   No significant coronary obstructive disease w/ mild 20% luminal irregularity of the first diag branch of the LAD  . CARDIAC CATHETERIZATION  07/08/2002   No significant CAD, moderately depressed LV systolic function  . CARDIAC CATHETERIZATION Bilateral 04/26/2007   Normal findings, recommend medical therapy  . CARDIAC CATHETERIZATION  02/18/2008   Moderate CAD, would benefit from having a functional study, recommend continue medical therapy  . CARDIAC CATHETERIZATION  07/23/2012   Medical therapy  . CARDIAC CATHETERIZATION N/A 11/24/2014   Procedure: Left Heart Cath and Coronary Angiography;  Surgeon: Troy Sine, MD;  Location: [facility] CV LAB;  Service: Cardiovascular;  Laterality: N/A;  . CARDIAC CATHETERIZATION  11/27/2014   Procedure: Intravascular Pressure Wire/FFR Study;  Surgeon: Peter M Martinique, MD;  Location: [facility] CV LAB;  Service: Cardiovascular;;  . CARDIAC CATHETERIZATION  10/09/2016  . CORONARY ANGIOGRAM  2010  . CORONARY ARTERY BYPASS GRAFT N/A 11/29/2014   Procedure: CORONARY ARTERY BYPASS GRAFTING x 5 (LIMA-LAD, SVG-D, SVG-OM1-OM2, SVG-PD);  Surgeon: Melrose Nakayama, MD;  Location: [facility];  Service: Open Heart Surgery;  Laterality: N/A;  . CORONARY STENT INTERVENTION N/A 10/31/2016   Procedure: CORONARY STENT INTERVENTION;  Surgeon: Burnell Blanks, MD;  Location: [facility] LAB;  Service: Cardiovascular;  Laterality: N/A;  . FRACTURE SURGERY    . INSERT / REPLACE / [place]  . KNEE ARTHROSCOPY Bilateral   . LEFT HEART CATHETERIZATION WITH CORONARY ANGIOGRAM N/A 07/23/2012   Procedure: LEFT HEART CATHETERIZATION WITH CORONARY ANGIOGRAM;  Surgeon: Leonie Man, MD;  Location: [facility] CATH LAB;  Service: Cardiovascular;  Laterality: N/A;  Carlton Adam MYOVIEW  11/14/2011   Mild-moderate defect seen in Mid Inferolateral and Mid Anterolateral regions-consistant w/  infarct/scar. No significant ischemia demonstrated.  [name] RIGHT/LEFT HEART CATH AND CORONARY ANGIOGRAPHY N/A 10/09/2016   Procedure: RIGHT/LEFT HEART CATH AND CORONARY ANGIOGRAPHY;  Surgeon: Jolaine Artist, MD;  Location: [facility];  Service: Cardiovascular;  Laterality: N/A;  . TEE WITHOUT CARDIOVERSION N/A 11/29/2014   Procedure: TRANSESOPHAGEAL ECHOCARDIOGRAM (TEE);  Surgeon: Melrose Nakayama, MD;  Location: [facility];  Service: Open Heart Surgery;  Laterality: N/A;  . TRANSTHORACIC ECHOCARDIOGRAM  07/23/2012   EF 55-60%, normal-mild  . TUBAL LIGATION      OB History    No data available       Home Medications    Prior to Admission medications   Medication Sig Start Date End Date Taking? Authorizing Provider  acetaminophen (TYLENOL) 500 MG tablet Take 1,000 mg by mouth every 6 (six) hours as needed (for pain.).   Yes [provider]  albuterol (PROVENTIL) (2.5 MG/3ML) 0.083% nebulizer solution Take 2.5 mg by nebulization every 6 (six) hours as needed for wheezing or shortness of breath.   Yes [provider]  aspirin EC 81 MG tablet Take 1 tablet (  81 mg total) by mouth daily. 03/27/16  Yes Kilroy, Luke K, PA-C  bisoprolol (ZEBETA) 5 MG tablet Take 1 tablet (5 mg total) by mouth daily. 11/13/16  Yes Kilroy, Luke K, PA-C  budesonide-formoterol (SYMBICORT) 160-4.5 MCG/ACT inhaler Inhale 2 puffs into the lungs 2 (two) times daily.   Yes [provider]  cetirizine (ZYRTEC) 10 MG tablet Take 10 mg by mouth daily.    Yes [provider]  docusate sodium (COLACE) 100 MG capsule Take 200 mg by mouth at bedtime.    Yes [provider]  fluticasone (FLONASE) 50 MCG/ACT nasal spray Place 1 spray into both nostrils at bedtime as needed for rhinitis or allergies.    Yes [provider]  GuaiFENesin (MUCINEX PO) Take 1 tablet by mouth at bedtime.   Yes [provider]  hydroxypropyl methylcellulose / hypromellose (ISOPTO TEARS /  GONIOVISC) 2.5 % ophthalmic solution Place 1 drop into both eyes 4 (four) times daily as needed for dry eyes.    Yes [provider]  ipratropium (ATROVENT) 0.06 % nasal spray 1-2 pffs up to 4 x daily as needed 10/16/16  Yes Tanda Rockers, MD  isosorbide mononitrate (IMDUR) 60 MG 24 hr tablet Take 1 tablet (60 mg total) by mouth daily. 02/05/17  Yes Kilroy, Luke K, PA-C  KLOR-CON M20 20 MEQ tablet TAKE 1 TABLET EVERY DAY 02/04/17  Yes Croitoru, [name], MD  metolazone (ZAROXOLYN) 5 MG tablet Take 1 tablet (5 mg total) by mouth as needed. Take only as directed by [facility] Patient taking differently: Take 5 mg by mouth daily. Take 1 tablet (5 mg) by mouth when directed by [facility] 07/21/16  Yes Eileen Stanford, PA-C  Multiple Vitamin (MULTIVITAMIN WITH MINERALS) TABS tablet Take 1 tablet by mouth daily.   Yes [provider]  OXYGEN Inhale 2 L into the lungs See admin instructions. During waking hours as needed for low oxygen levels & at bedtime each night.   Yes [provider]  pantoprazole (PROTONIX) 40 MG tablet Take 1 tablet (40 mg total) by mouth daily. 12/10/16 12/10/17 Yes Croitoru, [name], MD  rosuvastatin (CRESTOR) 20 MG tablet TAKE 1 TABLET EVERY DAY Patient taking differently: TAKE 1 TABLET (20 MG) BY MOUTH DAILY AT BEDTIME 11/26/16  Yes Croitoru, [name], MD  spironolactone (ALDACTONE) 25 MG tablet TAKE 1 TABLET EVERY DAY Patient taking differently: TAKE 1 TABLET (25 MG) BY MOUTH DAILY AT BEDTIME 11/26/16  Yes Croitoru, [name], MD  torsemide (DEMADEX) 20 MG tablet Take 2 tablets (40 mg total) by mouth daily. Patient taking differently: Take 40 mg by mouth See admin instructions. Take 2 tablets (40 mg) by mouth every morning, take 1 tablet (20 mg) in the evening as needed for weight gain of 2 lbs in 24 hours 10/03/16  Yes Bensimhon, Shaune Pascal, MD  triamcinolone cream (KENALOG) 0.1 % Apply 1 application topically 2 (two) times daily as needed  (itching).  08/24/14  Yes [provider]  VENTOLIN HFA 108 (90 Base) MCG/ACT inhaler INHALE 2 PUFFS EVERY 4 HOURS AS NEEDED FOR WHEEZING 07/10/16  Yes Rigoberto Noel, MD  clopidogrel (PLAVIX) 75 MG tablet TAKE 1 TABLET EVERY DAY 02/04/17   Croitoru, [name], MD  colchicine 0.6 MG tablet Take one tablet two times daily as needed. Patient taking differently: Take 0.6 mg by mouth 2 (two) times daily as needed (gout flare).  09/30/16   Marybelle Killings, MD  EPINEPHrine (EPIPEN 2-PAK) 0.3 mg/0.3 mL  IJ SOAJ injection Inject 0.3 mg into the muscle once as needed (severe allergic reaction).    [provider]  nitroGLYCERIN (NITROSTAT) 0.4 MG SL tablet Place 1 tablet (0.4 mg total) under the tongue every 5 (five) minutes as needed for chest pain. 11/13/16 02/11/17  Erlene Quan, PA-C    Family History Family History  Problem Relation Age of Onset  . Breast cancer Mother   . Diabetes Mother   . Heart disease Maternal Grandmother   . Kidney disease Maternal Grandmother   . Diabetes Maternal Grandmother   . Glaucoma Maternal Aunt   . Heart disease Maternal Aunt   . Asthma Sister     Social History Social History   Tobacco Use  . Smoking status: Former Smoker    Packs/day: 0.25    Years: 3.00    Pack years: 0.75    Types: Cigarettes    Last attempt to quit: 02/11/1968    Years since quitting: 49.2  . Smokeless tobacco: Never Used  Substance Use Topics  . Alcohol use: No  . Drug use: No     Allergies   Ivp dye [iodinated diagnostic agents]; Shellfish-derived products; Sulfa antibiotics; Uloric [febuxostat]; Iodine; Atorvastatin; Doxycycline; Cephalexin; and Zithromax [azithromycin]   Review of Systems Review of Systems  Gastrointestinal: Positive for abdominal pain, diarrhea, nausea and vomiting.  All other systems reviewed and are negative.    Physical Exam Updated Vital Signs BP (!) 141/68   Pulse 79   Temp 98.3 F (36.8 C) (Oral)   Resp 16   SpO2 95%    Physical Exam  Constitutional: She is oriented to person, place, and time. She appears well-developed and well-nourished.  HENT:  Head: Normocephalic and atraumatic.  Right Ear: External ear normal.  Left Ear: External ear normal.  Nose: Nose normal.  Mouth/Throat: Mucous membranes are dry.  Eyes: Conjunctivae and EOM are normal. Pupils are equal, round, and reactive to light.  Neck: Normal range of motion. Neck supple.  Cardiovascular: Normal rate, regular rhythm, normal heart sounds and intact distal pulses.  Pulmonary/Chest: Effort normal and breath sounds normal.  Abdominal: Soft. Bowel sounds are normal. There is generalized tenderness.  Musculoskeletal: Normal range of motion.  Neurological: She is alert and oriented to person, place, and time.  Skin: Skin is warm. Capillary refill takes less than 2 seconds.  Psychiatric: She has a normal mood and affect. Her behavior is normal. Judgment and thought content normal.  Nursing note and vitals reviewed.    ED Treatments / Results  Labs (all labs ordered are listed, but only abnormal results are displayed) Labs Reviewed  COMPREHENSIVE METABOLIC PANEL - Abnormal; Notable for the following components:      Result Value   Potassium 3.4 (*)    Glucose, Bld 124 (*)    Calcium 8.4 (*)    Total Protein 6.4 (*)    Albumin 3.0 (*)    GFR calc non Af Amer 55 (*)    All other components within normal limits  GASTROINTESTINAL PANEL BY PCR, STOOL (REPLACES STOOL CULTURE)  LIPASE, BLOOD  CBC  URINALYSIS, ROUTINE W REFLEX MICROSCOPIC    EKG  EKG Interpretation None       Radiology No results found.  Procedures Procedures (including critical care time)  Medications Ordered in ED Medications  promethazine (PHENERGAN) injection 12.5 mg (not administered)  ondansetron (ZOFRAN) injection 4 mg (4 mg Intravenous Given 04/18/17 1358)  sodium chloride 0.9 % bolus 1,000 mL (1,000 mLs Intravenous  New Bag/Given 04/18/17 1359)   morphine 4 MG/ML injection 4 mg (4 mg Intravenous Given 04/18/17 1358)     Initial Impression / Assessment and Plan / ED Course  I have reviewed the triage vital signs and the nursing notes.  Pertinent labs & imaging results that were available during my care of the patient were reviewed by me and considered in my medical decision making (see chart for details).     Pt is feeling better, but is still nauseous.  The pt will be given phenergan and we will do an oral challenge after that.  Pt will be signed out to Dr. Leonette Monarch pending po challenge.  I anticipate d/c.   I suspect pt has norovirus like her grandchild.    Final Clinical Impressions(s) / ED Diagnoses   Final diagnoses:  Dehydration  Norovirus    ED Discharge Orders    None       Isla Pence, MD 04/19/17 [phone]

## 2017-04-25 DIAGNOSIS — I5032 Chronic diastolic (congestive) heart failure: Secondary | ICD-10-CM | POA: Diagnosis not present

## 2017-04-25 DIAGNOSIS — R269 Unspecified abnormalities of gait and mobility: Secondary | ICD-10-CM | POA: Diagnosis not present

## 2017-04-25 DIAGNOSIS — R0602 Shortness of breath: Secondary | ICD-10-CM | POA: Diagnosis not present

## 2017-04-25 DIAGNOSIS — J9611 Chronic respiratory failure with hypoxia: Secondary | ICD-10-CM | POA: Diagnosis not present

## 2017-04-25 DIAGNOSIS — I10 Essential (primary) hypertension: Secondary | ICD-10-CM | POA: Diagnosis not present

## 2017-04-25 DIAGNOSIS — J4520 Mild intermittent asthma, uncomplicated: Secondary | ICD-10-CM | POA: Diagnosis not present

## 2017-05-01 DIAGNOSIS — G4733 Obstructive sleep apnea (adult) (pediatric): Secondary | ICD-10-CM | POA: Diagnosis not present

## 2017-05-01 DIAGNOSIS — J4530 Mild persistent asthma, uncomplicated: Secondary | ICD-10-CM | POA: Diagnosis not present

## 2017-05-02 IMAGING — CR DG CHEST 2V
2 series · 2 of 2 positions shown · non-contrast
Comparison: 03/04/2016.

CLINICAL DATA: Pain.  No known injury.

EXAM:
CHEST  2 VIEW

[w chest pa]
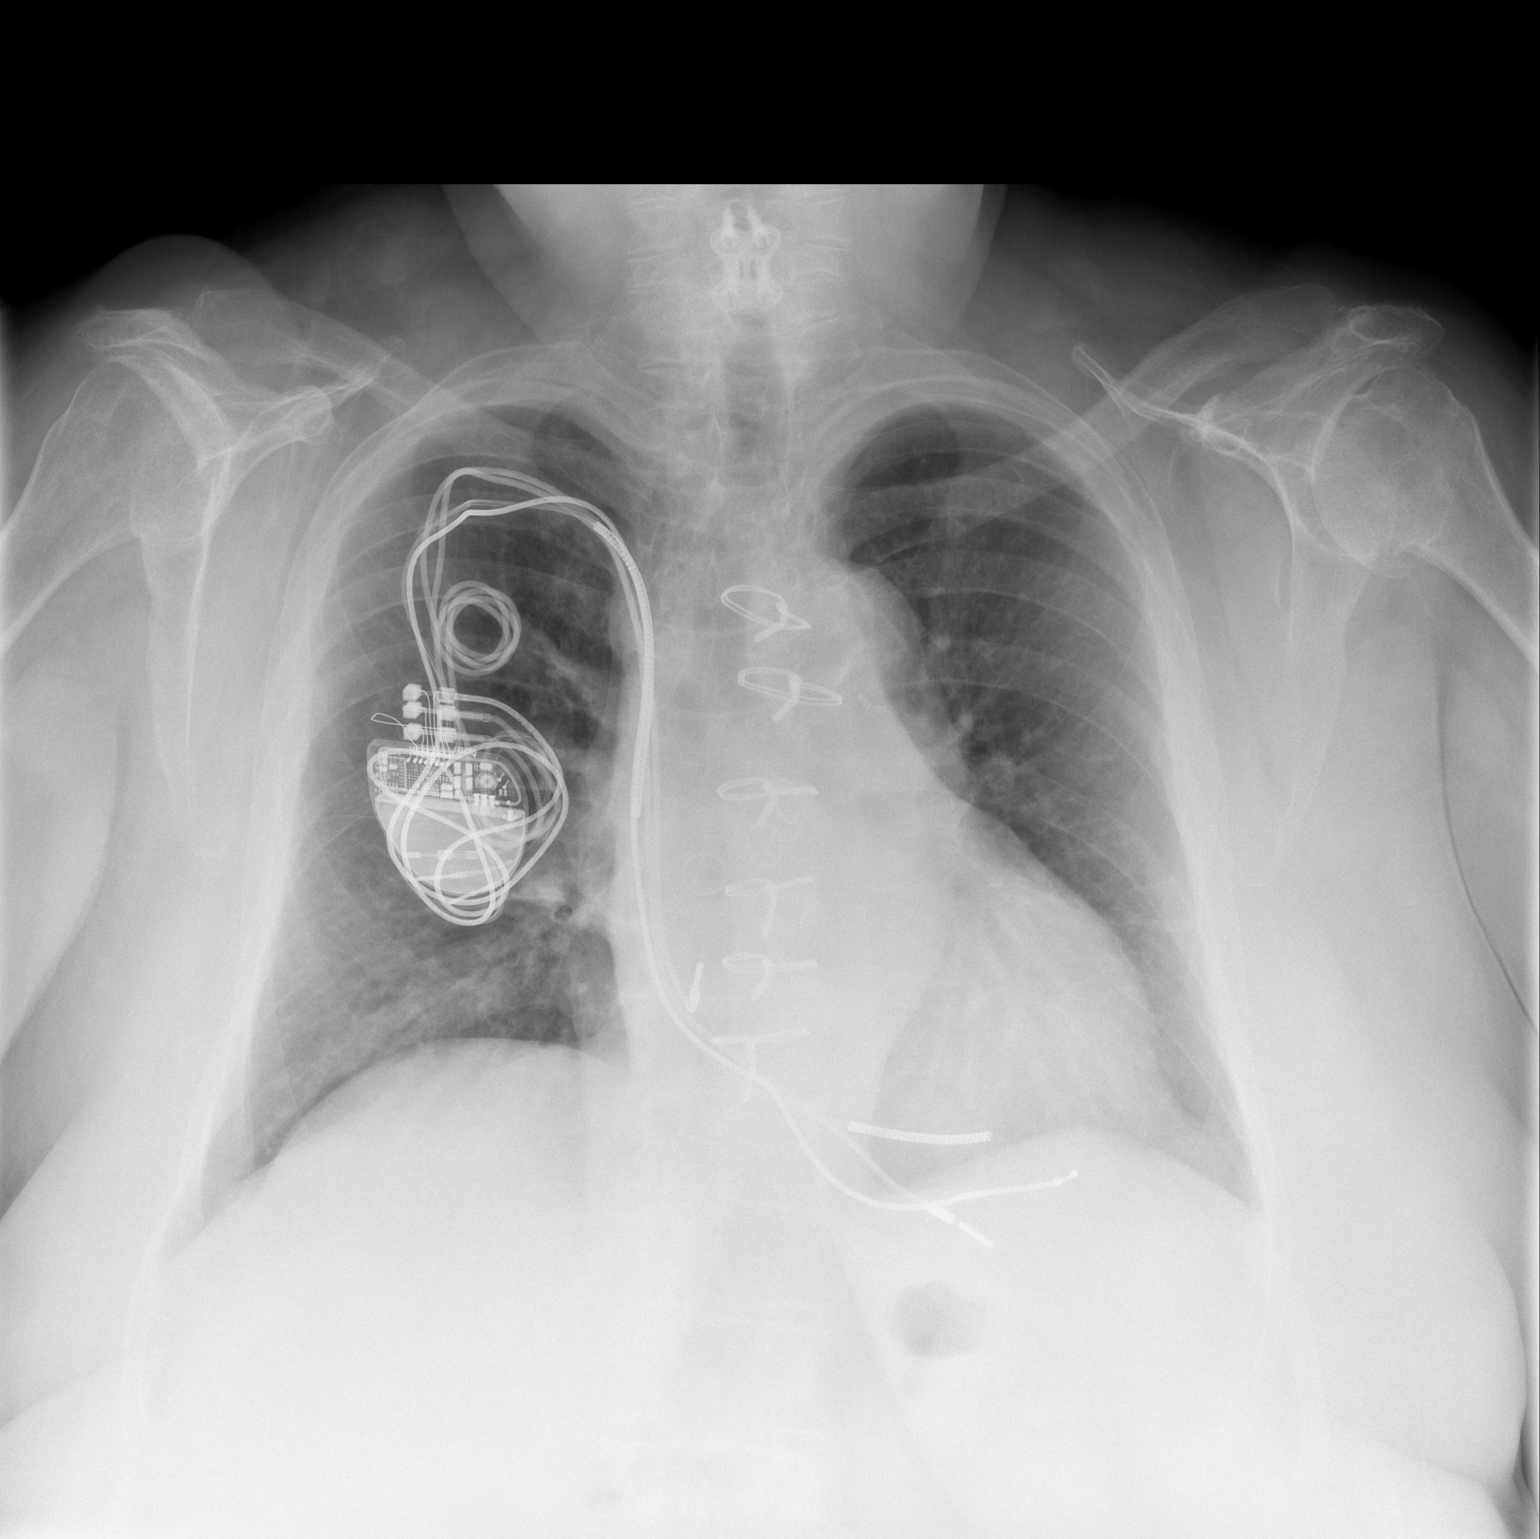

[w chest lat]
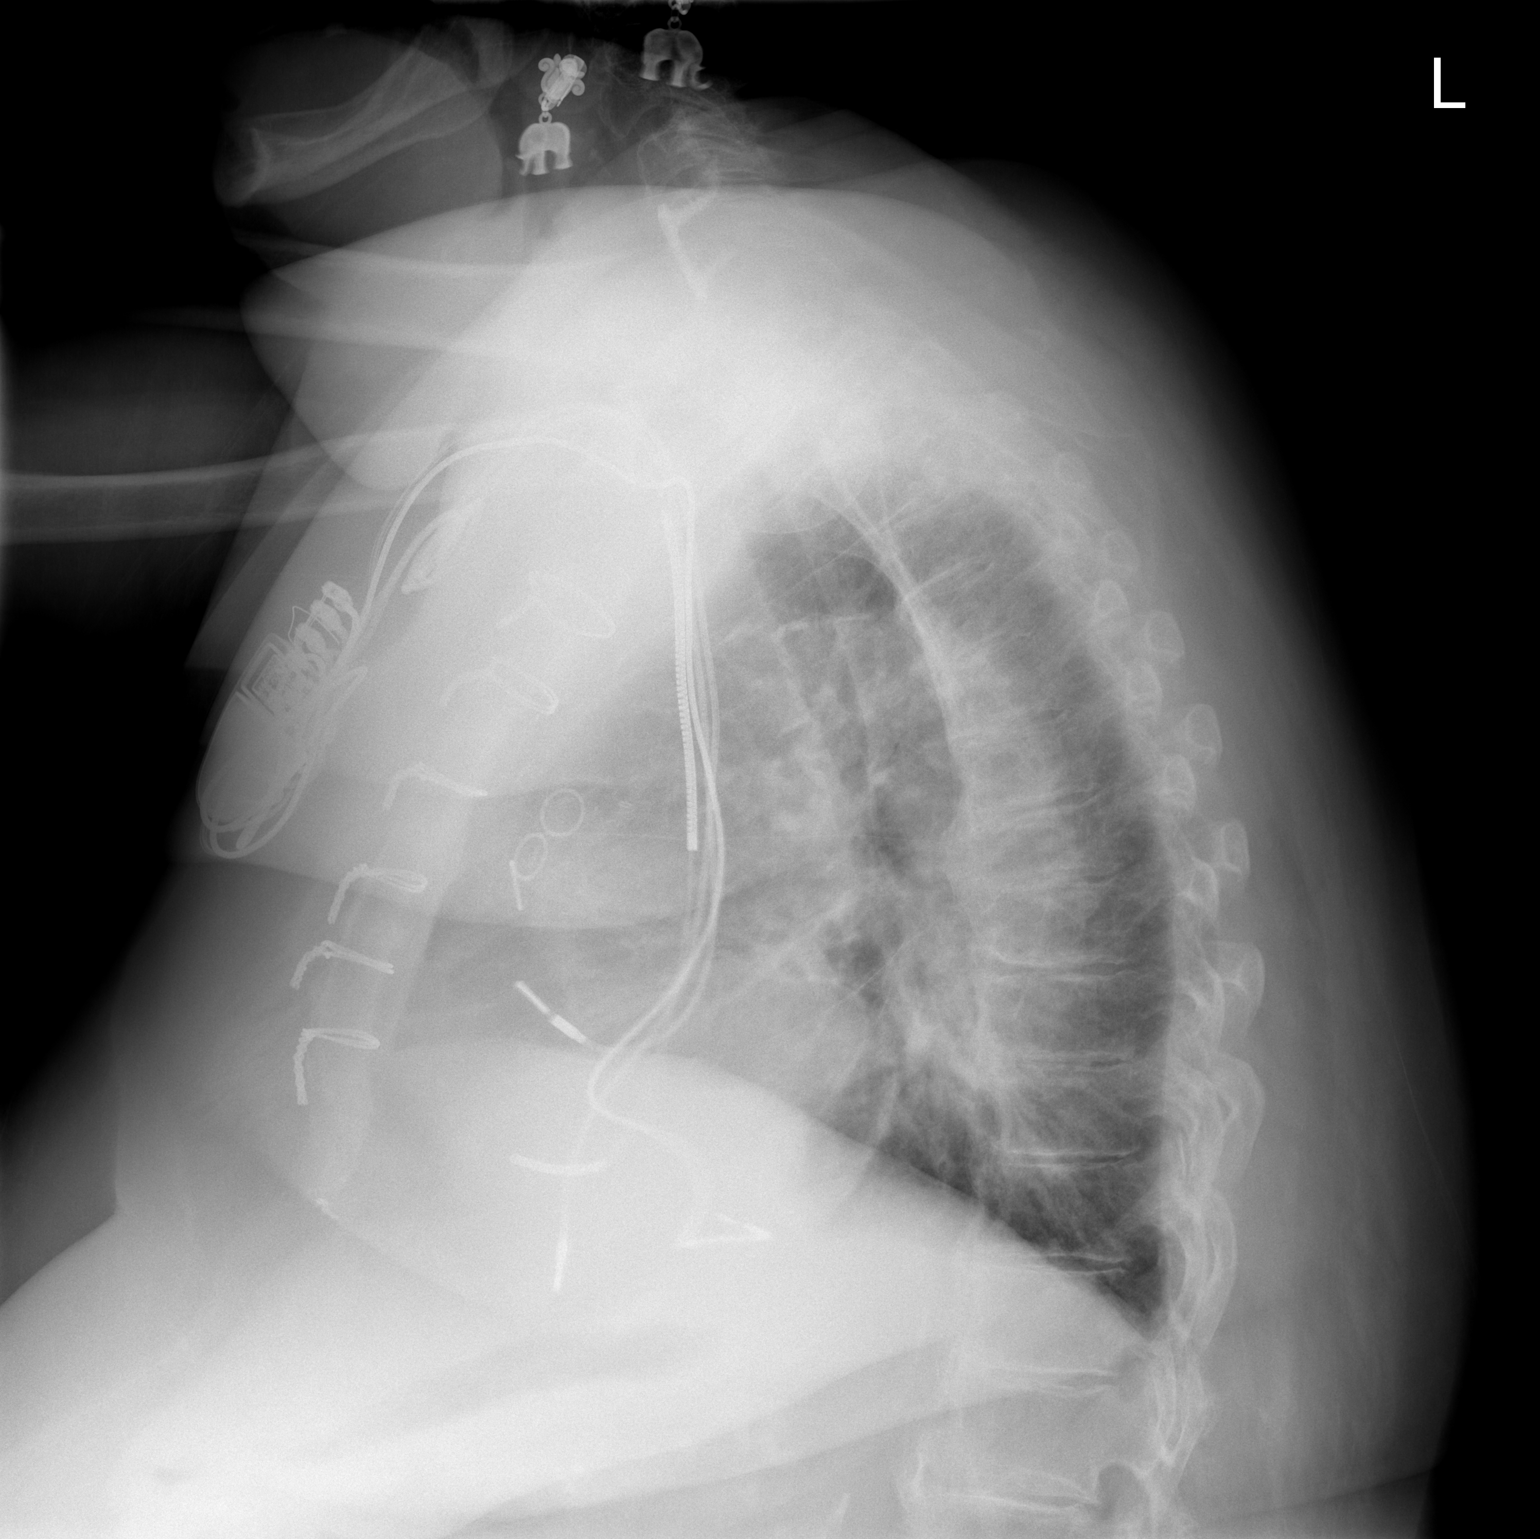

[2 of 2 positions shown; findings below may reference images not displayed]

FINDINGS: Cardiac pacer noted in stable position. Prior CABG. Heart size
normal. No focal infiltrate. No pleural effusion or pneumothorax.
Cervical spine fusion.
IMPRESSION: 1. Cardiac pacer noted stable position. Prior CABG. Heart size
normal.

2.  Low lung volumes.  Mild right base atelectasis.

## 2017-05-02 IMAGING — CR DG RIBS 2V*L*
3 series · 3 of 3 positions shown · non-contrast
Comparison: 03/04/2016

CLINICAL DATA: Left anterolateral rib cage pain, no known injury,
pain starting after bending over 4 days ago

EXAM:
LEFT RIBS - 2 VIEW

[w ribs ap/pa upper left *]
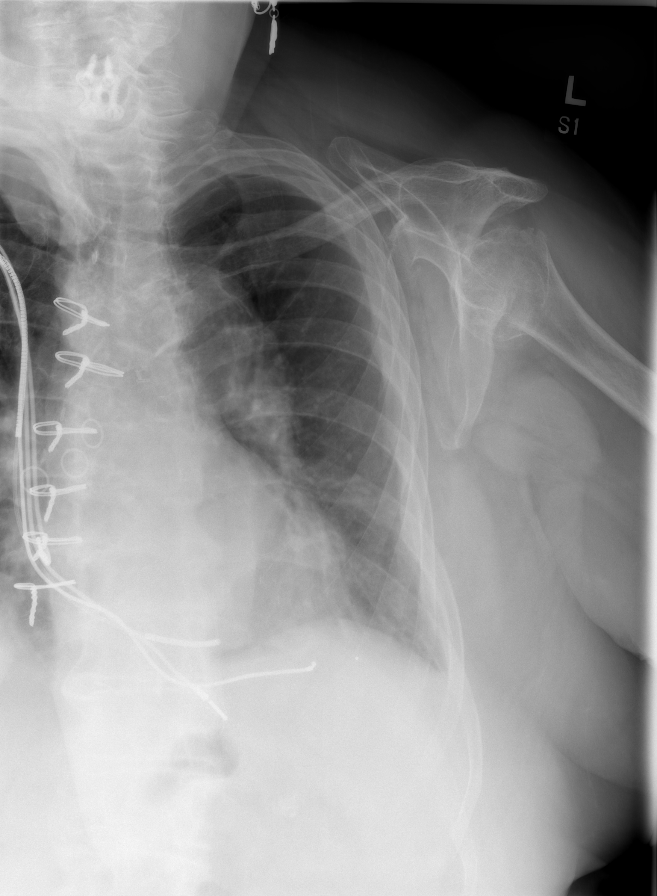

[w ribs ap/pa lower left]
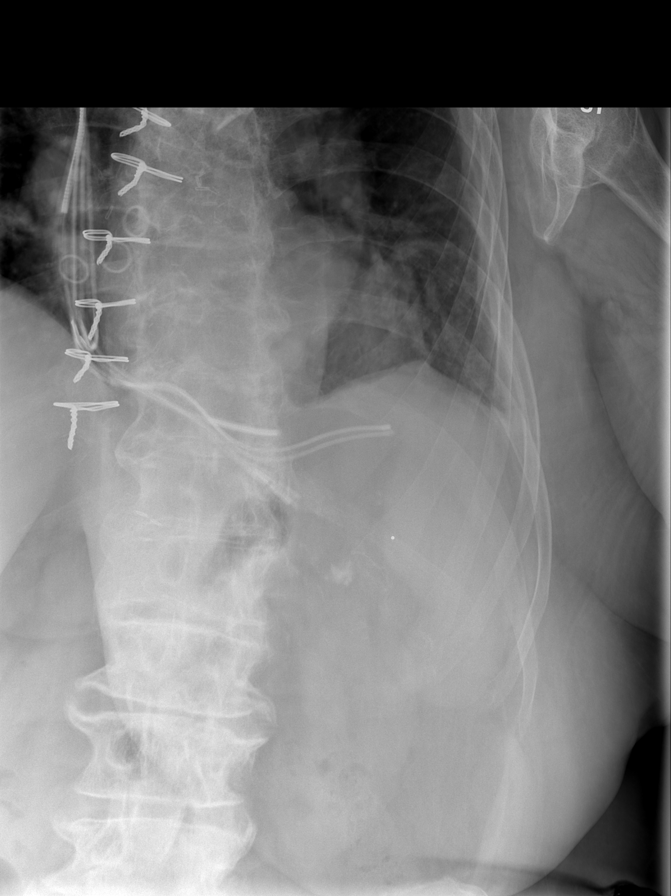

[w ribs oblique left]
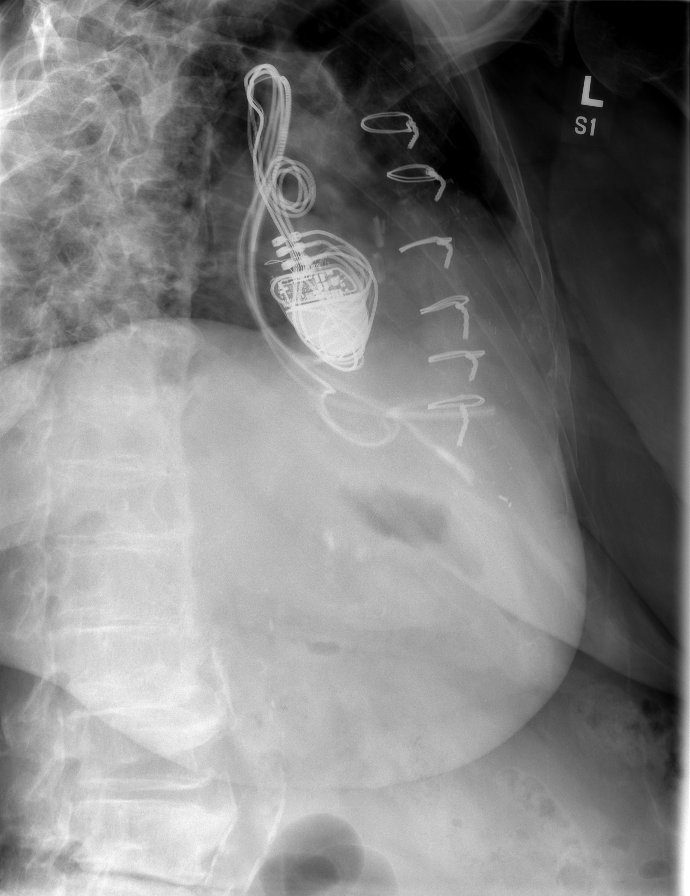

[3 of 3 positions shown; findings below may reference images not displayed]

FINDINGS: Three views left ribs submitted. Status post median sternotomy. Four
leads cardiac pacemaker unchanged in position. No left rib fracture
is identified. No pneumothorax. There are degenerative changes or
prior fracture deformity left humeral head.
IMPRESSION: No left rib fracture is identified.  No pneumothorax.

## 2017-05-05 DIAGNOSIS — I2510 Atherosclerotic heart disease of native coronary artery without angina pectoris: Secondary | ICD-10-CM | POA: Diagnosis not present

## 2017-05-05 DIAGNOSIS — J4520 Mild intermittent asthma, uncomplicated: Secondary | ICD-10-CM | POA: Diagnosis not present

## 2017-05-05 DIAGNOSIS — I5032 Chronic diastolic (congestive) heart failure: Secondary | ICD-10-CM | POA: Diagnosis not present

## 2017-05-05 DIAGNOSIS — I10 Essential (primary) hypertension: Secondary | ICD-10-CM | POA: Diagnosis not present

## 2017-05-05 DIAGNOSIS — E1122 Type 2 diabetes mellitus with diabetic chronic kidney disease: Secondary | ICD-10-CM | POA: Diagnosis not present

## 2017-05-05 DIAGNOSIS — N183 Chronic kidney disease, stage 3 (moderate): Secondary | ICD-10-CM | POA: Diagnosis not present

## 2017-05-13 ENCOUNTER — Telehealth: Payer: Self-pay | Admitting: Cardiovascular Disease

## 2017-05-13 NOTE — Telephone Encounter (Signed)
New Message:    Pt states she is having some issues with her oxygen.  Pt c/o Shortness Of Breath: STAT if SOB developed within the last 24 hours or pt is noticeably SOB on the phone  1. Are you currently SOB (can you hear that pt is SOB on the phone)?Yes  2. How long have you been experiencing SOB? A week or couple of weeks  3. Are you SOB when sitting or when up moving around? Both  4. Are you currently experiencing any other symptoms? Pt is having some stomach issues and trouble walking

## 2017-05-13 NOTE — Telephone Encounter (Signed)
Spoke to patient. She states that she is using oxygen more often during the day. She states she feels like  She phlegm or congestion.  Mo weight gain per  Patient weight lost yesterday 3lbs and last night 2 lbs. She states she did not weight today or took diuretic because she was out most of the day.  recommend to contact primary - if cardiac may cal back

## 2017-05-15 DIAGNOSIS — M10072 Idiopathic gout, left ankle and foot: Secondary | ICD-10-CM | POA: Diagnosis not present

## 2017-05-15 DIAGNOSIS — M109 Gout, unspecified: Secondary | ICD-10-CM | POA: Diagnosis not present

## 2017-05-15 DIAGNOSIS — M79672 Pain in left foot: Secondary | ICD-10-CM | POA: Diagnosis not present

## 2017-05-15 DIAGNOSIS — M25572 Pain in left ankle and joints of left foot: Secondary | ICD-10-CM | POA: Diagnosis not present

## 2017-05-15 DIAGNOSIS — N189 Chronic kidney disease, unspecified: Secondary | ICD-10-CM | POA: Diagnosis not present

## 2017-05-15 DIAGNOSIS — M19072 Primary osteoarthritis, left ankle and foot: Secondary | ICD-10-CM | POA: Diagnosis not present

## 2017-05-19 ENCOUNTER — Encounter: Payer: Self-pay | Admitting: Cardiovascular Disease

## 2017-05-19 ENCOUNTER — Ambulatory Visit (INDEPENDENT_AMBULATORY_CARE_PROVIDER_SITE_OTHER): Payer: Medicare HMO | Admitting: Cardiovascular Disease

## 2017-05-19 ENCOUNTER — Ambulatory Visit (INDEPENDENT_AMBULATORY_CARE_PROVIDER_SITE_OTHER): Payer: Medicare HMO | Admitting: *Deleted

## 2017-05-19 VITALS — BP 128/80 | HR 78 | Ht 62.0 in | Wt 252.8 lb

## 2017-05-19 DIAGNOSIS — I5032 Chronic diastolic (congestive) heart failure: Secondary | ICD-10-CM | POA: Diagnosis not present

## 2017-05-19 DIAGNOSIS — T82110D Breakdown (mechanical) of cardiac electrode, subsequent encounter: Secondary | ICD-10-CM

## 2017-05-19 DIAGNOSIS — Z950 Presence of cardiac pacemaker: Secondary | ICD-10-CM | POA: Diagnosis not present

## 2017-05-19 DIAGNOSIS — G4733 Obstructive sleep apnea (adult) (pediatric): Secondary | ICD-10-CM | POA: Diagnosis not present

## 2017-05-19 DIAGNOSIS — I442 Atrioventricular block, complete: Secondary | ICD-10-CM

## 2017-05-19 DIAGNOSIS — I25810 Atherosclerosis of coronary artery bypass graft(s) without angina pectoris: Secondary | ICD-10-CM | POA: Diagnosis not present

## 2017-05-19 NOTE — Patient Instructions (Signed)
Dr [name] recommends that you continue on your current medications as directed. Please refer to the Current Medication list given to you today.  Remote monitoring is used to monitor your Pacemaker or ICD from home. This monitoring reduces the number of office visits required to check your device to one time per year. It allows [place] to keep an eye on the functioning of your device to ensure it is working properly. You are scheduled for a device check from home on Tuesday, July 9th, 2019. You may send your transmission at any time that day. If you have a wireless device, the transmission will be sent automatically. After your physician reviews your transmission, you will receive a notification with your next transmission date.  Dr [name] recommends that you schedule a follow-up appointment in 12 months with a pacemaker check. You will receive a reminder letter in the mail two months in advance. If you don't receive a letter, please call our office to schedule the follow-up appointment.  If you need a refill on your cardiac medications before your next appointment, please call your pharmacy.

## 2017-05-19 NOTE — Progress Notes (Signed)
Remote pacemaker transmission.   

## 2017-05-19 NOTE — Progress Notes (Signed)
Patient ID: [name], female   DOB: 09-28-1945, 72 y.o.   MRN: [id number] .    Cardiology Office Note    Date:  05/24/2017   ID:  [name], DOB 1945-05-08, MRN [id number]  PCP:  Darcus Austin, MD  Cardiologist:   Sanda Klein, MD   Chief Complaint  Patient presents with  . Pacemaker Check    History of Present Illness:  [name] is a 72 y.o. female with CAD s/p CABG October 2016 (LIMA to LAD, SVG to D1, SVG to OM1 and OM2, SVG to PDA), diastolic heart failure, morbid obesity, adrenal insufficiency, type 2 diabetes mellitus, Hypertension, stage III chronic kidney disease, asthma, obstructive sleep apnea. Nonischemic cardiomyopathy and heart failure preceded the diagnosis of coronary disease. She has complete heart block and had a conventional pacemaker implanted in 2001. This was upgraded to a biventricular ICD implanted in 2008 for severe cardiomyopathy with ejection fraction 20%, downgraded to CRT-P in 2014 ([place]) following excellent response to resynchronization (hyper-responder, normalization of left ventricular systolic function). The current atrial and right ventricular leads were implanted in 2001, the left ventricular lead was implanted in 2008. The defibrillator lead implanted in 2008 is capped and its tip was resected at the time of bypass surgery. Ever since bypass surgery, atrial pacing thresholds have been extremely high, so her device is programmed VDD. Most recent echo shows ejection fraction normal at 55-60% with grade 1 diastolic dysfunction.  She has not had any cardiac problems since her last appointment.  Her most serious issues have been related to gout.  Treatment with steroids for the gout led to improvement of her breathing (she has pretty severe reactive airway disease).  She has not required any changes in her diuretic dose.  Pacemaker interrogation shows that she is fairly active, moving around about 2 hours a day.  Her [employer] int[employer] CRT-P device was implanted in 2014 and is programmed DDD due to extremely high atrial pacing thresholds (ever since she had bypass surgery).  Current generator longevity is estimated at 6.5 years.  She has 100% biventricular pacing.  LV lead is programmed LV tip-LV ring.  Very rare episodes of nonsustained ventricular tachycardia have been recorded and none since last year.  She also has very rare and brief episodes of paroxysmal atrial tachycardia.  There has not been any atrial fibrillation.  She only wears her oxygen at night.  She has not had significant lower extremity edema and does not have orthopnea or PND.  She cannot breathe if she lies flat on her back because her upper airway feels like it collapses and makes her choke, but she is able to sleep fully horizontal lying on her right side.  She has had less wheezing since taking prednisone for the gout.  She has not had any bleeding problems or any falls or injuries.  She denies angina pectoris.  She  "graduated" from cardiac rehab in January.  In the past we have estimated her dry weight to be around 248 pounds.  Clearly hypervolemic at 238 with hypotension and worsening renal function.    Past Medical History:  Diagnosis Date  . AICD (automatic cardioverter/defibrillator) present   . Anemia   . Arthritis   . Asthma   . CAD (coronary artery disease)    a. 10/09/16 LHC: SVG->LAD patent, SVG->Diag patent, SVG->RCA patent, SVG->LCx occluded. EF 60%, b. 10/31/16 LHC DES to AV groove Circ, DES to intermed branch  . Cellulitis and  abscess of foot 12/2014   RT FOOT  . CHF (congestive heart failure) ([place])   . Chronic bronchitis ([place])   . Chronic lower back pain   . Diverticulosis   . Facial numbness 02/2016  . Fatty liver disease, nonalcoholic   . Gastritis   . GERD (gastroesophageal reflux disease)   . Gout   . H/O hiatal hernia   . High cholesterol   . Hyperplastic colon polyp   . Hypertension   . IBS (irritable bowel  syndrome)   . Internal hemorrhoids   . Ischemic cardiomyopathy   . Obesity   . On home oxygen therapy    "2L at night" (10/31/2016)  . OSA (obstructive sleep apnea)    "can't tolerate a mask" (10/30/2016)  . Pneumonia    "couple times" (10/31/2016)  . Shortness of breath   . TIA (transient ischemic attack)    "recently" (10/31/2016)  . Type II diabetes mellitus ([place])     Past Surgical History:  Procedure Laterality Date  . ABDOMINAL ULTRASOUND  12/01/2011   Peripelvic cysts- #1- 2.4x1.9x2.3cm, #2-1.2x0.9x1.2cm  . ANKLE FRACTURE SURGERY Right    "had rod put in"  . ANTERIOR CERVICAL DECOMP/DISCECTOMY FUSION    . APPENDECTOMY    . BACK SURGERY    . BIV ICD INSERTION CRT-D  2001?  [name] BIV PACEMAKER GENERATOR CHANGE OUT N/A 11/02/2012   Procedure: BIV PACEMAKER GENERATOR CHANGE OUT;  Surgeon: Sanda Klein, MD;  Location: [facility] CATH LAB;  Service: Cardiovascular;  Laterality: N/A;  . CARDIAC CATHETERIZATION  05/17/1999   No significant coronary obstructive disease w/ mild 20% luminal irregularity of the first diag branch of the LAD  . CARDIAC CATHETERIZATION  07/08/2002   No significant CAD, moderately depressed LV systolic function  . CARDIAC CATHETERIZATION Bilateral 04/26/2007   Normal findings, recommend medical therapy  . CARDIAC CATHETERIZATION  02/18/2008   Moderate CAD, would benefit from having a functional study, recommend continue medical therapy  . CARDIAC CATHETERIZATION  07/23/2012   Medical therapy  . CARDIAC CATHETERIZATION N/A 11/24/2014   Procedure: Left Heart Cath and Coronary Angiography;  Surgeon: Troy Sine, MD;  Location: [facility] CV LAB;  Service: Cardiovascular;  Laterality: N/A;  . CARDIAC CATHETERIZATION  11/27/2014   Procedure: Intravascular Pressure Wire/FFR Study;  Surgeon: Peter M Martinique, MD;  Location: [facility] CV LAB;  Service: Cardiovascular;;  . CARDIAC CATHETERIZATION  10/09/2016  . CORONARY ANGIOGRAM  2010  . CORONARY ARTERY BYPASS GRAFT N/A  11/29/2014   Procedure: CORONARY ARTERY BYPASS GRAFTING x 5 (LIMA-LAD, SVG-D, SVG-OM1-OM2, SVG-PD);  Surgeon: Melrose Nakayama, MD;  Location: [facility];  Service: Open Heart Surgery;  Laterality: N/A;  . CORONARY STENT INTERVENTION N/A 10/31/2016   Procedure: CORONARY STENT INTERVENTION;  Surgeon: Burnell Blanks, MD;  Location: [facility];  Service: Cardiovascular;  Laterality: N/A;  . FRACTURE SURGERY    . INSERT / REPLACE / [place]  . KNEE ARTHROSCOPY Bilateral   . LEFT HEART CATHETERIZATION WITH CORONARY ANGIOGRAM N/A 07/23/2012   Procedure: LEFT HEART CATHETERIZATION WITH CORONARY ANGIOGRAM;  Surgeon: Leonie Man, MD;  Location: [facility] CATH LAB;  Service: Cardiovascular;  Laterality: N/A;  Carlton Adam MYOVIEW  11/14/2011   Mild-moderate defect seen in Mid Inferolateral and Mid Anterolateral regions-consistant w/ infarct/scar. No significant ischemia demonstrated.  [name] RIGHT/LEFT HEART CATH AND CORONARY ANGIOGRAPHY N/A 10/09/2016   Procedure: RIGHT/LEFT HEART CATH AND CORONARY ANGIOGRAPHY;  Surgeon: Jolaine Artist, MD;  Location: [facility]  INVASIVE CV LAB;  Service: Cardiovascular;  Laterality: N/A;  . TEE WITHOUT CARDIOVERSION N/A 11/29/2014   Procedure: TRANSESOPHAGEAL ECHOCARDIOGRAM (TEE);  Surgeon: Melrose Nakayama, MD;  Location: [facility];  Service: Open Heart Surgery;  Laterality: N/A;  . TRANSTHORACIC ECHOCARDIOGRAM  07/23/2012   EF 55-60%, normal-mild  . TUBAL LIGATION      Current Medications: Outpatient Medications Prior to Visit  Medication Sig Dispense Refill  . acetaminophen (TYLENOL) 500 MG tablet Take 1,000 mg by mouth every 6 (six) hours as needed (for pain.).    [name] albuterol (PROVENTIL) (2.5 MG/3ML) 0.083% nebulizer solution Take 2.5 mg by nebulization every 6 (six) hours as needed for wheezing or shortness of breath.    [name] aspirin EC 81 MG tablet Take 1 tablet (81 mg total) by mouth daily. 90 tablet 3  . bisoprolol (ZEBETA) 5 MG tablet Take 1  tablet (5 mg total) by mouth daily. 90 tablet 3  . budesonide-formoterol (SYMBICORT) 160-4.5 MCG/ACT inhaler Inhale 2 puffs into the lungs 2 (two) times daily.    . cetirizine (ZYRTEC) 10 MG tablet Take 1 tablet by mouth daily.    . clopidogrel (PLAVIX) 75 MG tablet TAKE 1 TABLET EVERY DAY 90 tablet 2  . colchicine 0.6 MG tablet Take one tablet two times daily as needed. (Patient taking differently: Take 0.6 mg by mouth 2 (two) times daily as needed (gout flare). ) 30 tablet 0  . docusate sodium (COLACE) 100 MG capsule Take 200 mg by mouth at bedtime.     [name] EPINEPHrine (EPIPEN 2-PAK) 0.3 mg/0.3 mL IJ SOAJ injection Inject 0.3 mg into the muscle once as needed (severe allergic reaction).    . fluticasone (FLONASE) 50 MCG/ACT nasal spray Place 1 spray into both nostrils at bedtime as needed for rhinitis or allergies.     . GuaiFENesin (MUCINEX PO) Take 1 tablet by mouth at bedtime.    . hydroxypropyl methylcellulose / hypromellose (ISOPTO TEARS / GONIOVISC) 2.5 % ophthalmic solution Place 1 drop into both eyes 4 (four) times daily as needed for dry eyes.     [name] ipratropium (ATROVENT) 0.06 % nasal spray 1-2 pffs up to 4 x daily as needed 15 mL 12  . isosorbide mononitrate (IMDUR) 60 MG 24 hr tablet Take 1 tablet (60 mg total) by mouth daily. 30 tablet 3  . KLOR-CON M20 20 MEQ tablet TAKE 1 TABLET EVERY DAY 90 tablet 2  . metolazone (ZAROXOLYN) 5 MG tablet Take 1 tablet (5 mg total) by mouth as needed. Take only as directed by Heart Failure Clinic (Patient taking differently: Take 5 mg by mouth daily. Take 1 tablet (5 mg) by mouth when directed by Heart Failure Clinic) 5 tablet 0  . Multiple Vitamin (MULTIVITAMIN WITH MINERALS) TABS tablet Take 1 tablet by mouth daily.    . ondansetron (ZOFRAN ODT) 4 MG disintegrating tablet Take 1 tablet (4 mg total) by mouth every 8 (eight) hours as needed. 10 tablet 0  . OXYGEN Inhale 2 L into the lungs See admin instructions. During waking hours as needed for low  oxygen levels & at bedtime each night.    . pantoprazole (PROTONIX) 40 MG tablet Take 1 tablet (40 mg total) by mouth daily. 90 tablet 3  . predniSONE (STERAPRED UNI-PAK 21 TAB) 10 MG (21) TBPK tablet Take 10 mg by mouth daily.  0  . rosuvastatin (CRESTOR) 20 MG tablet TAKE 1 TABLET EVERY DAY (Patient taking differently: TAKE 1 TABLET (20 MG) BY MOUTH DAILY AT  BEDTIME) 90 tablet 3  . spironolactone (ALDACTONE) 25 MG tablet TAKE 1 TABLET EVERY DAY (Patient taking differently: TAKE 1 TABLET (25 MG) BY MOUTH DAILY AT BEDTIME) 90 tablet 3  . torsemide (DEMADEX) 20 MG tablet Take 2 tablets (40 mg total) by mouth daily. (Patient taking differently: Take 40 mg by mouth See admin instructions. Take 2 tablets (40 mg) by mouth every morning, take 1 tablet (20 mg) in the evening as needed for weight gain of 2 lbs in 24 hours) 180 tablet 3  . triamcinolone cream (KENALOG) 0.1 % Apply 1 application topically 2 (two) times daily as needed (itching).     . VENTOLIN HFA 108 (90 Base) MCG/ACT inhaler INHALE 2 PUFFS EVERY 4 HOURS AS NEEDED FOR WHEEZING 18 g 3  . nitroGLYCERIN (NITROSTAT) 0.4 MG SL tablet Place 1 tablet (0.4 mg total) under the tongue every 5 (five) minutes as needed for chest pain. 25 tablet 3  . cetirizine (ZYRTEC) 10 MG tablet Take 10 mg by mouth daily.      No facility-administered medications prior to visit.      Allergies:   Ivp dye [iodinated diagnostic agents]; Shellfish-derived products; Sulfa antibiotics; Uloric [febuxostat]; Iodine; Atorvastatin; Doxycycline; Cephalexin; and Zithromax [azithromycin]   Social History   Socioeconomic History  . Marital status: Widowed    Spouse name: Not on file  . Number of children: 5  . Years of education: Not on file  . Highest education level: Not on file  Occupational History  . Occupation: STAFF/BUFFET    Employer: [employer]  Social Needs  . Financial resource strain: Very hard  . Food insecurity:    Worry: Often true     Inability: Often true  . Transportation needs:    Medical: Not on file    Non-medical: Not on file  Tobacco Use  . Smoking status: Former Smoker    Packs/day: 0.25    Years: 3.00    Pack years: 0.75    Types: Cigarettes    Last attempt to quit: 02/11/1968    Years since quitting: 49.3  . Smokeless tobacco: Never Used  Substance and Sexual Activity  . Alcohol use: No  . Drug use: No  . Sexual activity: Never  Lifestyle  . Physical activity:    Days per week: 0 days    Minutes per session: 0 min  . Stress: Very much  Relationships  . Social connections:    Talks on phone: Not on file    Gets together: Not on file    Attends religious service: Not on file    Active member of club or organization: Not on file    Attends meetings of clubs or organizations: Not on file    Relationship status: Not on file  Other Topics Concern  . Not on file  Social History Narrative   Widowed last year. She lives with her two sons.     Family History:  The patient's family history includes Asthma in her sister; Breast cancer in her mother; Diabetes in her maternal grandmother and mother; Glaucoma in her maternal aunt; Heart disease in her maternal aunt and maternal grandmother; Kidney disease in her maternal grandmother.   ROS:   Please see the history of present illness.    ROS All other systems reviewed and are negative.   PHYSICAL EXAM:   VS:  BP 128/80   Pulse 78   Ht 5\' 2"  (1.575 m)   Wt 252 lb 12.8 oz (114.7  kg)   BMI 46.24 kg/m      General: Alert, oriented x3, no distress, morbid obesity limits her physical exam.  Pacemaker site appears healthy. Head: no evidence of trauma, PERRL, EOMI, no exophtalmos or lid lag, no myxedema, no xanthelasma; normal ears, nose and oropharynx Neck: normal jugular venous pulsations and no hepatojugular reflux; brisk carotid pulses without delay and no carotid bruits Chest: clear to auscultation, no signs of consolidation by percussion or palpation,  normal fremitus, symmetrical and full respiratory excursions Cardiovascular: normal position and quality of the apical impulse, regular rhythm, normal first and second heart sounds, no murmurs, rubs or gallops Abdomen: no tenderness or distention, no masses by palpation, no abnormal pulsatility or arterial bruits, normal bowel sounds, no hepatosplenomegaly Extremities: no clubbing, cyanosis or edema; 2+ radial, ulnar and brachial pulses bilaterally; 2+ right femoral, posterior tibial and dorsalis pedis pulses; 2+ left femoral, posterior tibial and dorsalis pedis pulses; no subclavian or femoral bruits Neurological: grossly nonfocal Psych: Normal mood and affect   Wt Readings from Last 3 Encounters:  05/19/17 252 lb 12.8 oz (114.7 kg)  03/11/17 257 lb 15 oz (117 kg)  02/05/17 262 lb (118.8 kg)      Studies/Labs Reviewed:   EKG:  EKG is not ordered today  Recent Labs: 09/18/2016: B Natriuretic Peptide 237.8 04/18/2017: ALT 20; BUN 18; Creatinine, Ser 1.00; Hemoglobin 13.3; Platelets 153; Potassium 3.4; Sodium 138   Lipid Panel    Component Value Date/Time   CHOL 166 10/31/2016 0314   TRIG 181 (H) 10/31/2016 0314   HDL 39 (L) 10/31/2016 0314   CHOLHDL 4.3 10/31/2016 0314   VLDL 36 10/31/2016 0314   LDLCALC 91 10/31/2016 0314     ASSESSMENT:    1. Chronic diastolic heart failure ([place])   2. Coronary artery disease involving autologous vein coronary bypass graft without angina pectoris   3. CHB (complete heart block) ([facility])   4. Cardiac resynchronization therapy pacemaker (CRT-P) in place   5. Failure of pacemaker lead, subsequent encounter   6. OSA (obstructive sleep apnea)   7. Morbid obesity ([place])      PLAN:  In order of problems listed above:  1. CHF: she seemed to improve when switched from carvedilol to bisoprolol, with fewer problems with reactive airway disease.  She has not required any recent diuretic dose adjustments.  She has not required metolazone in a while.  She  only uses oxygen at night.  Left ventricular systolic function normalized after CRT (hyper responder). 2. CAD s/p CABG she does not have angina 3. CHB: She is pacemaker dependent and has 100% biventricular pacing 4. CRT-P with normal device function except for very high atrial pacing thresholds (she does not require atrial pacing). Device is programmed VDD. The current right atrial lead and right ventricular lead were placed in 2001. The current left ventricular lead was placed in 2008. Consider replacement of the right atrial lead at the time of her next generator change out.   5. OSA: Intolerant of CPAP 6. Morbid obesity: Makes diagnosis and management of her cardiac problems more difficult.  Etiology of her dyspnea is multifactorial, including morbid obesity and restrictive physiology, asthma, pulmonary hypertension due to obesity and obstructive sleep apnea as well as heart failure    Medication Adjustments/Labs and Tests Ordered: Current medicines are reviewed at length with the patient today.  Concerns regarding medicines are outlined above.  Medication changes, Labs and Tests ordered today are listed in the Patient Instructions below. Patient  Instructions  Dr [name] recommends that you continue on your current medications as directed. Please refer to the Current Medication list given to you today.  Remote monitoring is used to monitor your Pacemaker or ICD from home. This monitoring reduces the number of office visits required to check your device to one time per year. It allows [place] to keep an eye on the functioning of your device to ensure it is working properly. You are scheduled for a device check from home on Tuesday, July 9th, 2019. You may send your transmission at any time that day. If you have a wireless device, the transmission will be sent automatically. After your physician reviews your transmission, you will receive a notification with your next transmission date.  Dr [name]  recommends that you schedule a follow-up appointment in 12 months with a pacemaker check. You will receive a reminder letter in the mail two months in advance. If you don't receive a letter, please call our office to schedule the follow-up appointment.  If you need a refill on your cardiac medications before your next appointment, please call your pharmacy.    Signed, Sanda Klein, MD  05/24/2017 6:08 PM    [address], [place], [place]  [address] Phone: [phone]; Fax: [phone]

## 2017-05-21 ENCOUNTER — Encounter: Payer: Self-pay | Admitting: Cardiology

## 2017-05-24 ENCOUNTER — Encounter: Payer: Self-pay | Admitting: Cardiovascular Disease

## 2017-05-26 DIAGNOSIS — R0602 Shortness of breath: Secondary | ICD-10-CM | POA: Diagnosis not present

## 2017-05-26 DIAGNOSIS — R269 Unspecified abnormalities of gait and mobility: Secondary | ICD-10-CM | POA: Diagnosis not present

## 2017-05-26 DIAGNOSIS — M19072 Primary osteoarthritis, left ankle and foot: Secondary | ICD-10-CM | POA: Diagnosis not present

## 2017-05-26 DIAGNOSIS — J9611 Chronic respiratory failure with hypoxia: Secondary | ICD-10-CM | POA: Diagnosis not present

## 2017-05-26 DIAGNOSIS — I5032 Chronic diastolic (congestive) heart failure: Secondary | ICD-10-CM | POA: Diagnosis not present

## 2017-05-26 DIAGNOSIS — I10 Essential (primary) hypertension: Secondary | ICD-10-CM | POA: Diagnosis not present

## 2017-05-26 DIAGNOSIS — J4520 Mild intermittent asthma, uncomplicated: Secondary | ICD-10-CM | POA: Diagnosis not present

## 2017-05-26 DIAGNOSIS — N189 Chronic kidney disease, unspecified: Secondary | ICD-10-CM | POA: Diagnosis not present

## 2017-05-27 DIAGNOSIS — E1122 Type 2 diabetes mellitus with diabetic chronic kidney disease: Secondary | ICD-10-CM | POA: Diagnosis not present

## 2017-05-27 DIAGNOSIS — N183 Chronic kidney disease, stage 3 (moderate): Secondary | ICD-10-CM | POA: Diagnosis not present

## 2017-05-27 DIAGNOSIS — I5033 Acute on chronic diastolic (congestive) heart failure: Secondary | ICD-10-CM | POA: Diagnosis not present

## 2017-05-27 DIAGNOSIS — I2510 Atherosclerotic heart disease of native coronary artery without angina pectoris: Secondary | ICD-10-CM | POA: Diagnosis not present

## 2017-05-27 DIAGNOSIS — J4520 Mild intermittent asthma, uncomplicated: Secondary | ICD-10-CM | POA: Diagnosis not present

## 2017-05-27 DIAGNOSIS — I10 Essential (primary) hypertension: Secondary | ICD-10-CM | POA: Diagnosis not present

## 2017-05-27 DIAGNOSIS — I5032 Chronic diastolic (congestive) heart failure: Secondary | ICD-10-CM | POA: Diagnosis not present

## 2017-06-08 ENCOUNTER — Other Ambulatory Visit: Payer: Self-pay | Admitting: Cardiology

## 2017-06-15 DIAGNOSIS — M79672 Pain in left foot: Secondary | ICD-10-CM | POA: Diagnosis not present

## 2017-06-15 DIAGNOSIS — M10072 Idiopathic gout, left ankle and foot: Secondary | ICD-10-CM | POA: Diagnosis not present

## 2017-06-15 DIAGNOSIS — M109 Gout, unspecified: Secondary | ICD-10-CM | POA: Diagnosis not present

## 2017-06-15 DIAGNOSIS — M19072 Primary osteoarthritis, left ankle and foot: Secondary | ICD-10-CM | POA: Diagnosis not present

## 2017-06-15 DIAGNOSIS — M25572 Pain in left ankle and joints of left foot: Secondary | ICD-10-CM | POA: Diagnosis not present

## 2017-06-15 DIAGNOSIS — N189 Chronic kidney disease, unspecified: Secondary | ICD-10-CM | POA: Diagnosis not present

## 2017-06-16 LAB — CUP PACEART REMOTE DEVICE CHECK
Battery Remaining Longevity: 78 mo
Battery Remaining Percentage: 100 %
Brady Statistic RA Percent Paced: 0 %
Brady Statistic RV Percent Paced: 100 %
Date Time Interrogation Session: 20190409042200
Implantable Lead Implant Date: 20080923
Implantable Lead Implant Date: 20080923
Implantable Lead Implant Date: 20080923
Implantable Lead Location: 753858
Implantable Lead Location: 753859
Implantable Lead Location: 753860
Implantable Lead Model: 4285
Implantable Lead Model: 4555
Implantable Lead Model: 7120
Implantable Lead Serial Number: 170220
Implantable Lead Serial Number: 486468
Implantable Pulse Generator Implant Date: 20140923
Lead Channel Impedance Value: 625 Ohm
Lead Channel Impedance Value: 795 Ohm
Lead Channel Impedance Value: 984 Ohm
Lead Channel Pacing Threshold Amplitude: 0.7 V
Lead Channel Pacing Threshold Amplitude: 1.2 V
Lead Channel Pacing Threshold Pulse Width: 0.4 ms
Lead Channel Pacing Threshold Pulse Width: 0.8 ms
Lead Channel Setting Pacing Amplitude: 1.9 V
Lead Channel Setting Pacing Amplitude: 2 V
Lead Channel Setting Pacing Pulse Width: 0.4 ms
Lead Channel Setting Pacing Pulse Width: 0.8 ms
Lead Channel Setting Sensing Sensitivity: 2.5 mV
Lead Channel Setting Sensing Sensitivity: 2.5 mV
Pulse Gen Serial Number: 100731

## 2017-06-25 DIAGNOSIS — I10 Essential (primary) hypertension: Secondary | ICD-10-CM | POA: Diagnosis not present

## 2017-06-25 DIAGNOSIS — J4520 Mild intermittent asthma, uncomplicated: Secondary | ICD-10-CM | POA: Diagnosis not present

## 2017-06-25 DIAGNOSIS — R269 Unspecified abnormalities of gait and mobility: Secondary | ICD-10-CM | POA: Diagnosis not present

## 2017-06-25 DIAGNOSIS — R0602 Shortness of breath: Secondary | ICD-10-CM | POA: Diagnosis not present

## 2017-06-25 DIAGNOSIS — J9611 Chronic respiratory failure with hypoxia: Secondary | ICD-10-CM | POA: Diagnosis not present

## 2017-06-25 DIAGNOSIS — I5032 Chronic diastolic (congestive) heart failure: Secondary | ICD-10-CM | POA: Diagnosis not present

## 2017-06-30 DIAGNOSIS — I5033 Acute on chronic diastolic (congestive) heart failure: Secondary | ICD-10-CM | POA: Diagnosis not present

## 2017-06-30 DIAGNOSIS — N183 Chronic kidney disease, stage 3 (moderate): Secondary | ICD-10-CM | POA: Diagnosis not present

## 2017-06-30 DIAGNOSIS — I10 Essential (primary) hypertension: Secondary | ICD-10-CM | POA: Diagnosis not present

## 2017-06-30 DIAGNOSIS — I5032 Chronic diastolic (congestive) heart failure: Secondary | ICD-10-CM | POA: Diagnosis not present

## 2017-06-30 DIAGNOSIS — J4520 Mild intermittent asthma, uncomplicated: Secondary | ICD-10-CM | POA: Diagnosis not present

## 2017-06-30 DIAGNOSIS — I2510 Atherosclerotic heart disease of native coronary artery without angina pectoris: Secondary | ICD-10-CM | POA: Diagnosis not present

## 2017-06-30 DIAGNOSIS — E1122 Type 2 diabetes mellitus with diabetic chronic kidney disease: Secondary | ICD-10-CM | POA: Diagnosis not present

## 2017-07-08 DIAGNOSIS — N183 Chronic kidney disease, stage 3 (moderate): Secondary | ICD-10-CM | POA: Diagnosis not present

## 2017-07-08 DIAGNOSIS — Z0000 Encounter for general adult medical examination without abnormal findings: Secondary | ICD-10-CM | POA: Diagnosis not present

## 2017-07-08 DIAGNOSIS — E7800 Pure hypercholesterolemia, unspecified: Secondary | ICD-10-CM | POA: Diagnosis not present

## 2017-07-08 DIAGNOSIS — Z1159 Encounter for screening for other viral diseases: Secondary | ICD-10-CM | POA: Diagnosis not present

## 2017-07-08 DIAGNOSIS — I700 Atherosclerosis of aorta: Secondary | ICD-10-CM | POA: Diagnosis not present

## 2017-07-08 DIAGNOSIS — E1122 Type 2 diabetes mellitus with diabetic chronic kidney disease: Secondary | ICD-10-CM | POA: Diagnosis not present

## 2017-07-08 DIAGNOSIS — E1169 Type 2 diabetes mellitus with other specified complication: Secondary | ICD-10-CM | POA: Diagnosis not present

## 2017-07-08 DIAGNOSIS — I5033 Acute on chronic diastolic (congestive) heart failure: Secondary | ICD-10-CM | POA: Diagnosis not present

## 2017-07-08 DIAGNOSIS — I10 Essential (primary) hypertension: Secondary | ICD-10-CM | POA: Diagnosis not present

## 2017-07-16 ENCOUNTER — Telehealth: Payer: Self-pay | Admitting: Cardiovascular Disease

## 2017-07-16 ENCOUNTER — Telehealth (HOSPITAL_COMMUNITY): Payer: Self-pay

## 2017-07-16 NOTE — Telephone Encounter (Signed)
Thank you, agree with advice given MCr

## 2017-07-16 NOTE — Telephone Encounter (Signed)
Spoke with patient and she has been having chest pains radiating to back, worse at night when she is laying on her side. She did see her PCP last week for this. She is having swelling and some shortness of breath, took extra Torsemide last night and swelling improved. She was last seen by HF clinic in November and was to follow up in 6 months. Advised patient to call HF clinic, keep appointment Monday with Arnold Long DNP, and go to ED if worse.

## 2017-07-16 NOTE — Telephone Encounter (Signed)
New Message:   Pt said her primary doctor wanted her to see her Cardiologist. She have been having chest pain that radiate through her back,high blood pressure and her cholesterol was high. Pt wants to talk to the nurse,she has some concerns. I made pt an appointment with [name] on Monday.

## 2017-07-16 NOTE — Telephone Encounter (Signed)
Patient called CHF clinic traige line and left VM stating she needed appt with Dr. Haroldine Laws because she "was having problems", did not specify further. Attempted to return call to patient, no answer, no VM set up.  Renee Pain, RN

## 2017-07-18 DIAGNOSIS — M25572 Pain in left ankle and joints of left foot: Secondary | ICD-10-CM | POA: Diagnosis not present

## 2017-07-19 NOTE — Progress Notes (Signed)
Cardiology Office Note   Date:  07/20/2017   ID:  [name], DOB 05/22/1945, MRN [id number]  PCP:  Darcus Austin, MD  Cardiologist:  Dr. [name]   Chief Complaint  Patient presents with  . Follow-up    discuss statin medication  . Chest Pain  . Fatigue  . Dizziness  . Shortness of Breath     History of Present Illness: [name] is a 72 y.o. female who presents for ongoing assessment and management of coronary artery disease, with history of CABG in October 2016 (LIMA to LAD, SVG to diagonal 1, SVG to OM1 and OM 2, and SVG to PDA).  Other history includes chronic diastolic heart failure, hypertension, nonischemic cardiomyopathy.  Dry weight 248 pounds.   She also has a history of complete heart block with upgrade to biventricular ICD implanted in 2008 [facility]).  It was noted that the defibrillator lead implanted in 2008 was capped and its tip was resected at the time of bypass surgery in 2016.  On follow-up pacemaker interrogation it was noted that her thresholds had been very high and therefore her device was programmed to VDD.  Per [name] last office note dated 05/19/2017 (copied for accuracy):Pacemaker interrogation shows that she is fairly active, moving around about 2 hours a day.  Her [employer] intua CRT-P device was implanted in 2014 and is programmed DDD due to extremely high atrial pacing thresholds (ever since she had bypass surgery).  Current generator longevity is estimated at 6.5 years.  She has 100% biventricular pacing.  LV lead is programmed LV tip-LV ring.  Very rare episodes of nonsustained ventricular tachycardia have been recorded and none since last year.  She also has very rare and brief episodes of paroxysmal atrial tachycardia.  There has not been any atrial fibrillation.  Other noncardiac specific issues include stage III chronic kidney disease, morbid obesity, adrenal insufficiency, type 2 diabetes, asthma, and OSA.  The  patient called our office on 07/16/2017 with complaints of lower extremity edema, and some shortness of breath.  She states that it was worse at night.  She had taken extra doses of torsemide and swelling did improve.  She is also being followed in the advanced heart failure clinic by Dr. Haroldine Laws.  She called their office as well for follow-up appointment sooner than previously scheduled.  She has multiple somatic complaints. She is on prednisone and is not sleeping well, is hungry all of the time. She has broken her left 5th toe and is now in a walking cast. She states that she is feeling better with the extra doses of torsemide she has taken (normally on 40 mg daily, but takes extra 20 mg in the evening for the last few days). She has overall fatigue. She states that her scale is weighing her 20 lbs lighter than our scale in the office.   She has had recent labs on 07/08/2017 by PCP.  Na: 142; potassium 4.0; chloride 107: Glucose 143; BUN 38; creatinine 1.52.  Hemoglobin A1c 7.2.  Lipid panel: Total cholesterol 188, triglycerides 155, LDL 111, HDL D 47.   Past Medical History:  Diagnosis Date  . AICD (automatic cardioverter/defibrillator) present   . Anemia   . Arthritis   . Asthma   . CAD (coronary artery disease)    a. 10/09/16 LHC: SVG->LAD patent, SVG->Diag patent, SVG->RCA patent, SVG->LCx occluded. EF 60%, b. 10/31/16 LHC DES to AV groove Circ, DES to intermed branch  . Cellulitis and  abscess of foot 12/2014   RT FOOT  . CHF (congestive heart failure) ([place])   . Chronic bronchitis ([place])   . Chronic lower back pain   . Diverticulosis   . Facial numbness 02/2016  . Fatty liver disease, nonalcoholic   . Gastritis   . GERD (gastroesophageal reflux disease)   . Gout   . H/O hiatal hernia   . High cholesterol   . Hyperplastic colon polyp   . Hypertension   . IBS (irritable bowel syndrome)   . Internal hemorrhoids   . Ischemic cardiomyopathy   . Obesity   . On home oxygen therapy     "2L at night" (10/31/2016)  . OSA (obstructive sleep apnea)    "can't tolerate a mask" (10/30/2016)  . Pneumonia    "couple times" (10/31/2016)  . Shortness of breath   . TIA (transient ischemic attack)    "recently" (10/31/2016)  . Type II diabetes mellitus ([place])     Past Surgical History:  Procedure Laterality Date  . ABDOMINAL ULTRASOUND  12/01/2011   Peripelvic cysts- #1- 2.4x1.9x2.3cm, #2-1.2x0.9x1.2cm  . ANKLE FRACTURE SURGERY Right    "had rod put in"  . ANTERIOR CERVICAL DECOMP/DISCECTOMY FUSION    . APPENDECTOMY    . BACK SURGERY    . BIV ICD INSERTION CRT-D  2001?  [name] BIV PACEMAKER GENERATOR CHANGE OUT N/A 11/02/2012   Procedure: BIV PACEMAKER GENERATOR CHANGE OUT;  Surgeon: Sanda Klein, MD;  Location: [place] CATH LAB;  Service: Cardiovascular;  Laterality: N/A;  . CARDIAC CATHETERIZATION  05/17/1999   No significant coronary obstructive disease w/ mild 20% luminal irregularity of the first diag branch of the LAD  . CARDIAC CATHETERIZATION  07/08/2002   No significant CAD, moderately depressed LV systolic function  . CARDIAC CATHETERIZATION Bilateral 04/26/2007   Normal findings, recommend medical therapy  . CARDIAC CATHETERIZATION  02/18/2008   Moderate CAD, would benefit from having a functional study, recommend continue medical therapy  . CARDIAC CATHETERIZATION  07/23/2012   Medical therapy  . CARDIAC CATHETERIZATION N/A 11/24/2014   Procedure: Left Heart Cath and Coronary Angiography;  Surgeon: Troy Sine, MD;  Location: [place] CV LAB;  Service: Cardiovascular;  Laterality: N/A;  . CARDIAC CATHETERIZATION  11/27/2014   Procedure: Intravascular Pressure Wire/FFR Study;  Surgeon: Peter M Martinique, MD;  Location: [place] CV LAB;  Service: Cardiovascular;;  . CARDIAC CATHETERIZATION  10/09/2016  . CORONARY ANGIOGRAM  2010  . CORONARY ARTERY BYPASS GRAFT N/A 11/29/2014   Procedure: CORONARY ARTERY BYPASS GRAFTING x 5 (LIMA-LAD, SVG-D, SVG-OM1-OM2, SVG-PD);  Surgeon:  Melrose Nakayama, MD;  Location: [place];  Service: Open Heart Surgery;  Laterality: N/A;  . CORONARY STENT INTERVENTION N/A 10/31/2016   Procedure: CORONARY STENT INTERVENTION;  Surgeon: Burnell Blanks, MD;  Location: [place] CV LAB;  Service: Cardiovascular;  Laterality: N/A;  . FRACTURE SURGERY    . INSERT / REPLACE / Progress  . KNEE ARTHROSCOPY Bilateral   . LEFT HEART CATHETERIZATION WITH CORONARY ANGIOGRAM N/A 07/23/2012   Procedure: LEFT HEART CATHETERIZATION WITH CORONARY ANGIOGRAM;  Surgeon: Leonie Man, MD;  Location: [facility] CATH LAB;  Service: Cardiovascular;  Laterality: N/A;  Carlton Adam MYOVIEW  11/14/2011   Mild-moderate defect seen in Mid Inferolateral and Mid Anterolateral regions-consistant w/ infarct/scar. No significant ischemia demonstrated.  [name] RIGHT/LEFT HEART CATH AND CORONARY ANGIOGRAPHY N/A 10/09/2016   Procedure: RIGHT/LEFT HEART CATH AND CORONARY ANGIOGRAPHY;  Surgeon: Jolaine Artist, MD;  Location: [facility]  INVASIVE CV LAB;  Service: Cardiovascular;  Laterality: N/A;  . TEE WITHOUT CARDIOVERSION N/A 11/29/2014   Procedure: TRANSESOPHAGEAL ECHOCARDIOGRAM (TEE);  Surgeon: Melrose Nakayama, MD;  Location: [place];  Service: Open Heart Surgery;  Laterality: N/A;  . TRANSTHORACIC ECHOCARDIOGRAM  07/23/2012   EF 55-60%, normal-mild  . TUBAL LIGATION       Current Outpatient Medications  Medication Sig Dispense Refill  . acetaminophen (TYLENOL) 500 MG tablet Take 1,000 mg by mouth every 6 (six) hours as needed (for pain.).    [name] albuterol (PROVENTIL) (2.5 MG/3ML) 0.083% nebulizer solution Take 2.5 mg by nebulization every 6 (six) hours as needed for wheezing or shortness of breath.    [name] aspirin EC 81 MG tablet Take 1 tablet (81 mg total) by mouth daily. 90 tablet 3  . bisoprolol (ZEBETA) 5 MG tablet Take 1 tablet (5 mg total) by mouth daily. 90 tablet 3  . budesonide-formoterol (SYMBICORT) 160-4.5 MCG/ACT inhaler Inhale 2 puffs into the lungs 2  (two) times daily.    . cetirizine (ZYRTEC) 10 MG tablet Take 1 tablet by mouth daily.    . clopidogrel (PLAVIX) 75 MG tablet TAKE 1 TABLET EVERY DAY 90 tablet 2  . colchicine 0.6 MG tablet Take one tablet two times daily as needed. (Patient taking differently: Take 0.6 mg by mouth 2 (two) times daily as needed (gout flare). ) 30 tablet 0  . docusate sodium (COLACE) 100 MG capsule Take 200 mg by mouth at bedtime.     [name] EPINEPHrine (EPIPEN 2-PAK) 0.3 mg/0.3 mL IJ SOAJ injection Inject 0.3 mg into the muscle once as needed (severe allergic reaction).    . fluticasone (FLONASE) 50 MCG/ACT nasal spray Place 1 spray into both nostrils at bedtime as needed for rhinitis or allergies.     . GuaiFENesin (MUCINEX PO) Take 1 tablet by mouth at bedtime.    . hydroxypropyl methylcellulose / hypromellose (ISOPTO TEARS / GONIOVISC) 2.5 % ophthalmic solution Place 1 drop into both eyes 4 (four) times daily as needed for dry eyes.     [name] ipratropium (ATROVENT) 0.06 % nasal spray 1-2 pffs up to 4 x daily as needed 15 mL 12  . isosorbide mononitrate (IMDUR) 60 MG 24 hr tablet Take 1 tablet (60 mg total) by mouth daily. 180 tablet 3  . KLOR-CON M20 20 MEQ tablet TAKE 1 TABLET EVERY DAY 90 tablet 2  . metolazone (ZAROXOLYN) 5 MG tablet Take 1 tablet (5 mg total) by mouth as needed. Take only as directed by Heart Failure Clinic (Patient taking differently: Take 5 mg by mouth daily. Take 1 tablet (5 mg) by mouth when directed by Heart Failure Clinic) 5 tablet 0  . Multiple Vitamin (MULTIVITAMIN WITH MINERALS) TABS tablet Take 1 tablet by mouth daily.    . ondansetron (ZOFRAN ODT) 4 MG disintegrating tablet Take 1 tablet (4 mg total) by mouth every 8 (eight) hours as needed. 10 tablet 0  . OXYGEN Inhale 2 L into the lungs See admin instructions. During waking hours as needed for low oxygen levels & at bedtime each night.    . pantoprazole (PROTONIX) 40 MG tablet Take 1 tablet (40 mg total) by mouth daily. 90 tablet 3  .  predniSONE (STERAPRED UNI-PAK 21 TAB) 10 MG (21) TBPK tablet Take 10 mg by mouth daily.  0  . rosuvastatin (CRESTOR) 20 MG tablet TAKE 1 TABLET EVERY DAY (Patient taking differently: TAKE 1 TABLET (20 MG) BY MOUTH DAILY AT BEDTIME) 90 tablet  3  . spironolactone (ALDACTONE) 25 MG tablet TAKE 1 TABLET EVERY DAY (Patient taking differently: TAKE 1 TABLET (25 MG) BY MOUTH DAILY AT BEDTIME) 90 tablet 3  . torsemide (DEMADEX) 20 MG tablet Take 2 tablets (40 mg total) by mouth daily. TAKE 60 MG (1.5TAB) EVERY OTHER DAY WITH 40 MG 270 tablet 3  . triamcinolone cream (KENALOG) 0.1 % Apply 1 application topically 2 (two) times daily as needed (itching).     . VENTOLIN HFA 108 (90 Base) MCG/ACT inhaler INHALE 2 PUFFS EVERY 4 HOURS AS NEEDED FOR WHEEZING 18 g 3  . clotrimazole (LOTRIMIN) 1 % cream Apply 1 application topically 2 (two) times daily. 30 g 0  . ezetimibe (ZETIA) 10 MG tablet Take 1 tablet (10 mg total) by mouth daily. 90 tablet 3  . nitroGLYCERIN (NITROSTAT) 0.4 MG SL tablet Place 1 tablet (0.4 mg total) under the tongue every 5 (five) minutes as needed for chest pain. 25 tablet 3   No current facility-administered medications for this visit.     Allergies:   Ivp dye [iodinated diagnostic agents]; Shellfish-derived products; Sulfa antibiotics; Uloric [febuxostat]; Iodine; Atorvastatin; Doxycycline; Cephalexin; and Zithromax [azithromycin]    Social History:  The patient  reports that she quit smoking about 49 years ago. Her smoking use included cigarettes. She has a 0.75 pack-year smoking history. She has never used smokeless tobacco. She reports that she does not drink alcohol or use drugs.   Family History:  The patient's family history includes Asthma in her sister; Breast cancer in her mother; Diabetes in her maternal grandmother and mother; Glaucoma in her maternal aunt; Heart disease in her maternal aunt and maternal grandmother; Kidney disease in her maternal grandmother.    ROS: All  other systems are reviewed and negative. Unless otherwise mentioned in H&P    PHYSICAL EXAM: VS:  BP 107/65   Pulse 65   Ht 5\' 2"  (1.575 m)   Wt 250 lb (113.4 kg)   BMI 45.73 kg/m  , BMI Body mass index is 45.73 kg/m. GEN: Well nourished, well developed, in no acute distress obese. Steroid moon-face HEENT: normal  Neck: no JVD, carotid bruits, or masses Cardiac: RRR; no murmurs, rubs, or gallops,no edema  Respiratory:  Clear to auscultation bilaterally, normal work of breathing, diminished in the bases. GI: soft, nontender, nondistended, + BS MS: no deformity or atrophy. Blacked 4th and 5th toes on the left foot Wearing walking cast.  Skin: warm and dry, no rash Neuro:  Strength and sensation are intact Psych: euthymic mood, full affect   EKG: Not completed on this visit.   Recent Labs: 09/18/2016: B Natriuretic Peptide 237.8 04/18/2017: ALT 20; BUN 18; Creatinine, Ser 1.00; Hemoglobin 13.3; Platelets 153; Potassium 3.4; Sodium 138    Lipid Panel    Component Value Date/Time   CHOL 166 10/31/2016 0314   TRIG 181 (H) 10/31/2016 0314   HDL 39 (L) 10/31/2016 0314   CHOLHDL 4.3 10/31/2016 0314   VLDL 36 10/31/2016 0314   LDLCALC 91 10/31/2016 0314      Wt Readings from Last 3 Encounters:  07/20/17 250 lb (113.4 kg)  05/19/17 252 lb 12.8 oz (114.7 kg)  03/11/17 257 lb 15 oz (117 kg)      Other studies Reviewed: Echocardiogram 2015/11/02 Left ventricle: The cavity size was normal. There was mild   concentric hypertrophy. Systolic function was normal. The   estimated ejection fraction was in the range of 55% to 60%. Wall   motion  was normal; there were no regional wall motion   abnormalities. Doppler parameters are consistent with abnormal   left ventricular relaxation (grade 1 diastolic dysfunction). - Mitral valve: Calcified annulus. Mildly thickened, mildly   calcified leaflets . - Right ventricle: Pacer wire or catheter noted in right ventricle.  Cardiac Cath  10/09/2017 Conclusion     Mid RCA lesion, 90 %stenosed.  1st Mrg lesion, 85 %stenosed.  1st Diag lesion, 70 %stenosed.  Ost 2nd Diag to 2nd Diag lesion, 70 %stenosed.  Prox RCA lesion, 100 %stenosed.  Prox Cx to Dist Cx lesion, 90 %stenosed.  Ost 1st Diag lesion, 80 %stenosed.  Mid LAD to Dist LAD lesion, 100 %stenosed.  LIMA.  SVG.  Origin lesion, 100 %stenosed.   Findings:  Ao = 117/61 (83) LV = 126/8 RA = 11 RV = 39/10 PA = 37/16 (25) PCW = 13 Fick cardiac output/index = 5.1/2.3 PVR = 2.4 WU FA sat = 95% PA sat = 65%  Assessment: 1. Severe 3v CAD 2. LIMA  to LAD widely patent 3. SVG to Diagonal widely patent 4. SVG to RCA widely patent 5. SVG to LCx and OM occluded ostially 6. LVEF 60% 7. Mild PAH with normal PCWP and output     10/31/2016 Procedures   CORONARY STENT INTERVENTION  Conclusion   1. Unstable angina 2. Successful PTCA/DES x 3 AV groove circumflex 3. Successful PTCA/DES x 1 intermediate branch  Recommendations: Continue DAPT with ASA and Plavix for one year.      ASSESSMENT AND PLAN:  1. Chronic Diastolic CHF: She is feeling better now with extra doses of torehich she has taken on her own. She is normally on 40 mg of torsemide daily and now has taken 20 mg extra for the last 2 days. I am going to increase this to 60 mg QOD as she continues to have some fluid build up in association with steroids. She is to follow up in the Advanced Heart Failure clinic as well. Wt is close to baseline per our scale. She will need follow up BMET   2. CAD: Hx of CABG. No complaints of chest discomfort currently. She does have chronic back and shoulder pain, with torso movement.   3. ICD in situ: ICD checks per protocol and prescheduled appointments.   4. Chronic Gout: She is on steroid therapy now with tapering per PCP.  5. Obesity: Severely limited activity due to weight, musculoskeletal issues, and now broken left 5th toe.   6.  Hypercholesterolemia:  On rosuvastatin 40 mg but LDL is not at goal. Will add Zetia 10 mg daily with goal of LDL <70. . I have given her low cholesterol heart healthy diet in written form along with counseling. She continues to struggle with these restrictions.    Current medicines are reviewed at length with the patient today.    Labs/ tests ordered today include: Follow up BMET on [facility] visit.  Phill Myron. West Pugh, ANP, AACC   07/20/2017 3:04 PM    [facility] [address], [place], [place] [address] Phone: [phone]; Fax: [phone]

## 2017-07-20 ENCOUNTER — Ambulatory Visit (INDEPENDENT_AMBULATORY_CARE_PROVIDER_SITE_OTHER): Payer: Medicare HMO | Admitting: Adult Health

## 2017-07-20 ENCOUNTER — Encounter: Payer: Self-pay | Admitting: Adult Health

## 2017-07-20 VITALS — BP 107/65 | HR 65 | Ht 62.0 in | Wt 250.0 lb

## 2017-07-20 DIAGNOSIS — I480 Paroxysmal atrial fibrillation: Secondary | ICD-10-CM | POA: Diagnosis not present

## 2017-07-20 DIAGNOSIS — I5032 Chronic diastolic (congestive) heart failure: Secondary | ICD-10-CM | POA: Diagnosis not present

## 2017-07-20 DIAGNOSIS — I2510 Atherosclerotic heart disease of native coronary artery without angina pectoris: Secondary | ICD-10-CM | POA: Diagnosis not present

## 2017-07-20 DIAGNOSIS — E7800 Pure hypercholesterolemia, unspecified: Secondary | ICD-10-CM | POA: Diagnosis not present

## 2017-07-20 MED ORDER — TORSEMIDE 20 MG PO TABS: 40.0000 mg | ORAL_TABLET | Freq: Every day | ORAL | 3 refills | Status: DC

## 2017-07-20 MED ORDER — CLOTRIMAZOLE 1 % EX CREA: 1.0000 "application " | TOPICAL_CREAM | Freq: Two times a day (BID) | CUTANEOUS | 0 refills | Status: DC

## 2017-07-20 MED ORDER — EZETIMIBE 10 MG PO TABS: 10.0000 mg | ORAL_TABLET | Freq: Every day | ORAL | 3 refills | Status: DC

## 2017-07-20 NOTE — Progress Notes (Signed)
Agree with note and plan for diuretic dose adjustment. Thanks [employer]

## 2017-07-20 NOTE — Patient Instructions (Addendum)
Medication Instructions:  INCREASE TORSEMIDE 40MG  ONE DAY 60MG  EVER-OTHER-DAY   START ZETIA 10MG  DAILY AT BEDTIME  If you need a refill on your cardiac medications before your next appointment, please call your pharmacy.  Special Instructions: PLEASE FOLLOW LOW SODIUM AND HEART HEALTHY DIET  Follow-Up: Your physician wants you to follow-up in: [place] ([place]), DNP,AACC IF PRIMARY CARDIOLOGIST IS UNAVAILABLE.    Thank you for choosing [facility] at [facility]!!      Fat and Cholesterol Restricted Diet Getting too much fat and cholesterol in your diet may cause health problems. Following this diet helps keep your fat and cholesterol at normal levels. This can keep you from getting sick. What types of fat should I choose?  Choose monosaturated and polyunsaturated fats. These are found in foods such as olive oil, canola oil, flaxseeds, walnuts, almonds, and seeds.  Eat more omega-3 fats. Good choices include salmon, mackerel, sardines, tuna, flaxseed oil, and ground flaxseeds.  Limit saturated fats. These are in animal products such as meats, butter, and cream. They can also be in plant products such as palm oil, palm kernel oil, and coconut oil.  Avoid foods with partially hydrogenated oils in them. These contain trans fats. Examples of foods that have trans fats are stick margarine, some tub margarines, cookies, crackers, and other baked goods. What general guidelines do I need to follow?  Check food labels. Look for the words "trans fat" and "saturated fat."  When preparing a meal: ? Fill half of your plate with vegetables and green salads. ? Fill one fourth of your plate with whole grains. Look for the word "whole" as the first word in the ingredient list. ? Fill one fourth of your plate with lean protein foods.  Eat more foods that have fiber, like apples, carrots, beans, peas, and barley.  Eat more home-cooked foods. Eat  less at restaurants and buffets.  Limit or avoid alcohol.  Limit foods high in starch and sugar.  Limit fried foods.  Cook foods without frying them. Baking, boiling, grilling, and broiling are all great options.  Lose weight if you are overweight. Losing even a small amount of weight can help your overall health. It can also help prevent diseases such as diabetes and heart disease. What foods can I eat? Grains Whole grains, such as whole wheat or whole grain breads, crackers, cereals, and pasta. Unsweetened oatmeal, bulgur, barley, quinoa, or brown rice. Corn or whole wheat flour tortillas. Vegetables Fresh or frozen vegetables (raw, steamed, roasted, or grilled). Green salads. Fruits All fresh, canned (in natural juice), or frozen fruits. Meat and Other Protein Products Ground beef (85% or leaner), grass-fed beef, or beef trimmed of fat. Skinless chicken or [place]. Ground chicken or [place]. Pork trimmed of fat. All fish and seafood. Eggs. Dried beans, peas, or lentils. Unsalted nuts or seeds. Unsalted canned or dry beans. Dairy Low-fat dairy products, such as skim or 1% milk, 2% or reduced-fat cheeses, low-fat ricotta or cottage cheese, or plain low-fat yogurt. Fats and Oils Tub margarines without trans fats. Light or reduced-fat mayonnaise and salad dressings. Avocado. Olive, canola, sesame, or safflower oils. Natural peanut or almond butter (choose ones without added sugar and oil). The items listed above may not be a complete list of recommended foods or beverages. Contact your dietitian for more options. What foods are not recommended? Grains White bread. White pasta. White rice. Cornbread. Bagels, pastries, and croissants. Crackers that contain trans fat. Vegetables  White potatoes. Corn. Creamed or fried vegetables. Vegetables in a cheese sauce. Fruits Dried fruits. Canned fruit in light or heavy syrup. Fruit juice. Meat and Other Protein Products Fatty cuts of meat. Ribs,  chicken wings, bacon, sausage, bologna, salami, chitterlings, fatback, hot dogs, bratwurst, and packaged luncheon meats. Liver and organ meats. Dairy Whole or 2% milk, cream, half-and-half, and cream cheese. Whole milk cheeses. Whole-fat or sweetened yogurt. Full-fat cheeses. Nondairy creamers and whipped toppings. Processed cheese, cheese spreads, or cheese curds. Sweets and Desserts Corn syrup, sugars, honey, and molasses. Candy. Jam and jelly. Syrup. Sweetened cereals. Cookies, pies, cakes, donuts, muffins, and ice cream. Fats and Oils Butter, stick margarine, lard, shortening, ghee, or bacon fat. Coconut, palm kernel, or palm oils. Beverages Alcohol. Sweetened drinks (such as sodas, lemonade, and fruit drinks or punches). The items listed above may not be a complete list of foods and beverages to avoid. Contact your dietitian for more information. This information is not intended to replace advice given to you by your health care provider. Make sure you discuss any questions you have with your health care provider. Document Released: 07/29/2011 Document Revised: 10/04/2015 Document Reviewed: 04/28/2013 Elsevier Interactive Patient Education  2018 [employer].   Low-Sodium Eating Plan Sodium, which is an element that makes up salt, helps you maintain a healthy balance of fluids in your body. Too much sodium can increase your blood pressure and cause fluid and waste to be held in your body. Your health care provider or dietitian may recommend following this plan if you have high blood pressure (hypertension), kidney disease, liver disease, or heart failure. Eating less sodium can help lower your blood pressure, reduce swelling, and protect your heart, liver, and kidneys. What are tips for following this plan? General guidelines  Most people on this plan should limit their sodium intake to 1,500-2,000 mg (milligrams) of sodium each day. Reading food labels  The Nutrition Facts label lists  the amount of sodium in one serving of the food. If you eat more than one serving, you must multiply the listed amount of sodium by the number of servings.  Choose foods with less than 140 mg of sodium per serving.  Avoid foods with 300 mg of sodium or more per serving. Shopping  Look for lower-sodium products, often labeled as "low-sodium" or "no salt added."  Always check the sodium content even if foods are labeled as "unsalted" or "no salt added".  Buy fresh foods. ? Avoid canned foods and premade or frozen meals. ? Avoid canned, cured, or processed meats  Buy breads that have less than 80 mg of sodium per slice. Cooking  Eat more home-cooked food and less restaurant, buffet, and fast food.  Avoid adding salt when cooking. Use salt-free seasonings or herbs instead of table salt or sea salt. Check with your health care provider or pharmacist before using salt substitutes.  Cook with plant-based oils, such as canola, sunflower, or olive oil. Meal planning  When eating at a restaurant, ask that your food be prepared with less salt or no salt, if possible.  Avoid foods that contain MSG (monosodium glutamate). MSG is sometimes added to [place] food, bouillon, and some canned foods. What foods are recommended? The items listed may not be a complete list. Talk with your dietitian about what dietary choices are best for you. Grains Low-sodium cereals, including oats, puffed wheat and rice, and shredded wheat. Low-sodium crackers. Unsalted rice. Unsalted pasta. Low-sodium bread. Whole-grain breads and whole-grain pasta. Vegetables Fresh  or frozen vegetables. "No salt added" canned vegetables. "No salt added" tomato sauce and paste. Low-sodium or reduced-sodium tomato and vegetable juice. Fruits Fresh, frozen, or canned fruit. Fruit juice. Meats and other protein foods Fresh or frozen (no salt added) meat, poultry, seafood, and fish. Low-sodium canned tuna and salmon. Unsalted nuts.  Dried peas, beans, and lentils without added salt. Unsalted canned beans. Eggs. Unsalted nut butters. Dairy Milk. Soy milk. Cheese that is naturally low in sodium, such as ricotta cheese, fresh mozzarella, or Swiss cheese Low-sodium or reduced-sodium cheese. Cream cheese. Yogurt. Fats and oils Unsalted butter. Unsalted margarine with no trans fat. Vegetable oils such as canola or olive oils. Seasonings and other foods Fresh and dried herbs and spices. Salt-free seasonings. Low-sodium mustard and ketchup. Sodium-free salad dressing. Sodium-free light mayonnaise. Fresh or refrigerated horseradish. Lemon juice. Vinegar. Homemade, reduced-sodium, or low-sodium soups. Unsalted popcorn and pretzels. Low-salt or salt-free chips. What foods are not recommended? The items listed may not be a complete list. Talk with your dietitian about what dietary choices are best for you. Grains Instant hot cereals. Bread stuffing, pancake, and biscuit mixes. Croutons. Seasoned rice or pasta mixes. Noodle soup cups. Boxed or frozen macaroni and cheese. Regular salted crackers. Self-rising flour. Vegetables Sauerkraut, pickled vegetables, and relishes. Olives. [place] fries. Onion rings. Regular canned vegetables (not low-sodium or reduced-sodium). Regular canned tomato sauce and paste (not low-sodium or reduced-sodium). Regular tomato and vegetable juice (not low-sodium or reduced-sodium). Frozen vegetables in sauces. Meats and other protein foods Meat or fish that is salted, canned, smoked, spiced, or pickled. Bacon, ham, sausage, hotdogs, corned beef, chipped beef, packaged lunch meats, salt pork, jerky, pickled herring, anchovies, regular canned tuna, sardines, salted nuts. Dairy Processed cheese and cheese spreads. Cheese curds. Blue cheese. Feta cheese. String cheese. Regular cottage cheese. Buttermilk. Canned milk. Fats and oils Salted butter. Regular margarine. Ghee. Bacon fat. Seasonings and other foods Onion  salt, garlic salt, seasoned salt, table salt, and sea salt. Canned and packaged gravies. Worcestershire sauce. Tartar sauce. Barbecue sauce. Teriyaki sauce. Soy sauce, including reduced-sodium. Steak sauce. Fish sauce. Oyster sauce. Cocktail sauce. Horseradish that you find on the shelf. Regular ketchup and mustard. Meat flavorings and tenderizers. Bouillon cubes. Hot sauce and Tabasco sauce. Premade or packaged marinades. Premade or packaged taco seasonings. Relishes. Regular salad dressings. Salsa. Potato and tortilla chips. Corn chips and puffs. Salted popcorn and pretzels. Canned or dried soups. Pizza. Frozen entrees and pot pies. Summary  Eating less sodium can help lower your blood pressure, reduce swelling, and protect your heart, liver, and kidneys.  Most people on this plan should limit their sodium intake to 1,500-2,000 mg (milligrams) of sodium each day.  Canned, boxed, and frozen foods are high in sodium. Restaurant foods, fast foods, and pizza are also very high in sodium. You also get sodium by adding salt to food.  Try to cook at home, eat more fresh fruits and vegetables, and eat less fast food, canned, processed, or prepared foods. This information is not intended to replace advice given to you by your health care provider. Make sure you discuss any questions you have with your health care provider. Document Released: 07/19/2001 Document Revised: 01/21/2016 Document Reviewed: 01/21/2016 Elsevier Interactive Patient Education  2018 [place] With Less [employer] with less salt is one way to reduce the amount of sodium you get from food. Depending on your condition and overall health, your health care provider or diet and nutrition specialist (dietitian) may  recommend that you reduce your sodium intake. Most people should have less than 2,300 milligrams (mg) of sodium each day. If you have high blood pressure (hypertension), you may need to limit your sodium to 1,500  mg each day. Follow the tips below to help reduce your sodium intake. What do I need to know about cooking with less salt? Shopping  Buy sodium-free or low-sodium products. Look for the following words on food labels: ? Low-sodium. ? Sodium-free. ? Reduced-sodium. ? No salt added. ? Unsalted.  Buy fresh or frozen vegetables. Avoid canned vegetables.  Avoid buying meats or protein foods that have been injected with broth or saline solution.  Avoid cured or smoked meats, such as hot dogs, bacon, salami, ham, and bologna. Reading food labels  Check the food label before buying or using packaged ingredients.  Look for products with no more than 140 mg of sodium in one serving.  Do not choose foods with salt as one of the first three ingredients on the ingredients list. If salt is one of the first three ingredients, it usually means the item is high in sodium, because ingredients are listed in order of amount in the food item. Cooking  Use herbs, seasonings without salt, and spices as substitutes for salt in foods.  Use sodium-free baking soda when baking.  Grill, braise, or roast foods to add flavor with less salt.  Avoid adding salt to pasta, rice, or hot cereals while cooking.  Drain and rinse canned vegetables before use.  Avoid adding salt when cooking sweets and desserts.  Cook with low-sodium ingredients. What are some salt alternatives? The following are herbs, seasonings, and spices that can be used instead of salt to give taste to your food. Herbs should be fresh or dried. Do not choose packaged mixes. Next to the name of the herb, spice, or seasoning are some examples of foods you can pair it with. Herbs  Bay leaves - Soups, meat and vegetable dishes, and spaghetti sauce.  Basil - [employer], soups, pasta, and fish dishes.  Cilantro - Meat, poultry, and vegetable dishes.  Chili powder - Marinades and Mexican dishes.  Chives - Salad dressings and potato  dishes.  Cumin - Mexican dishes, couscous, and meat dishes.  Dill - Fish dishes, sauces, and salads.  Fennel - Meat and vegetable dishes, breads, and cookies.  Garlic (do not use garlic salt) - [place] dishes, meat dishes, salad dressings, and sauces.  Marjoram - Soups, potato dishes, and meat dishes.  Oregano - Pizza and spaghetti sauce.  Parsley - Salads, soups, pasta, and meat dishes.  Rosemary - [place] dishes, salad dressings, soups, and red meats.  Saffron - Fish dishes, pasta, and some poultry dishes.  Sage - Stuffings and sauces.  Tarragon - Fish and [employer].  Thyme - Stuffing, meat, and fish dishes. Seasonings  Lemon juice - Fish dishes, poultry dishes, vegetables, and salads.  Vinegar - Salad dressings, vegetables, and fish dishes. Spices  Cinnamon - Sweet dishes, such as cakes, cookies, and puddings.  Cloves - Gingerbread, puddings, and marinades for meats.  Curry - Vegetable dishes, fish and poultry dishes, and stir-fry dishes.  Ginger - Vegetables dishes, fish dishes, and stir-fry dishes.  Nutmeg - Pasta, vegetables, poultry, fish dishes, and custard. What are some low-sodium ingredients and foods?  Fresh or frozen fruits and vegetables with no sauce added.  Fresh or frozen whole meats, poultry, and fish with no sauce added.  Eggs.  Noodles, pasta, quinoa, rice.  Shredded or puffed wheat or puffed rice.  Regular or quick oats.  Milk, yogurt, hard cheeses, and low-sodium cheeses. Good cheese choices include Swiss, [place]. Always check the label for the serving size and sodium content.  Unsalted butter or margarine.  Unsalted nuts.  Sherbet or ice cream (keep to  cup per serving).  Homemade pudding.  Sodium-free baking soda and baking powder. This is not a complete list of low-sodium ingredients and foods. Contact your dietitian for more options. Summary  Cooking with less salt is one way to reduce the  amount of sodium that you get from food.  Buy sodium-free or low-sodium products.  Check the food label before using or buying packaged ingredients.  Use herbs, seasonings without salt, and spices as substitutes for salt in foods. This information is not intended to replace advice given to you by your health care provider. Make sure you discuss any questions you have with your health care provider. Document Released: 01/27/2005 Document Revised: 02/05/2016 Document Reviewed: 02/05/2016 Elsevier Interactive Patient Education  2017 [employer].

## 2017-07-21 ENCOUNTER — Other Ambulatory Visit: Payer: Self-pay

## 2017-07-21 ENCOUNTER — Ambulatory Visit (INDEPENDENT_AMBULATORY_CARE_PROVIDER_SITE_OTHER): Payer: Medicare HMO | Admitting: Nurse Practitioner

## 2017-07-21 ENCOUNTER — Encounter: Payer: Self-pay | Admitting: Nurse Practitioner

## 2017-07-21 VITALS — BP 90/58 | HR 64 | Ht 62.0 in | Wt 253.4 lb

## 2017-07-21 DIAGNOSIS — Z1211 Encounter for screening for malignant neoplasm of colon: Secondary | ICD-10-CM

## 2017-07-21 DIAGNOSIS — R1310 Dysphagia, unspecified: Secondary | ICD-10-CM

## 2017-07-21 MED ORDER — FLUCONAZOLE 100 MG PO TABS: 100.0000 mg | ORAL_TABLET | ORAL | 0 refills | Status: DC

## 2017-07-21 NOTE — Progress Notes (Addendum)
IMPRESSION and PLAN:    #1. 72 yo female with intermittent solid food dysphagia for 2-3 months.   -patient feels dysphagia predates prednisone use but she has noticed white coating on tongue recently.  Rinses after inhalers. No obvious oral candida on exam today it is reasonable to to treat empirically for candidiasis prior to further testing.  -Diflucan X 7 days.  Patient will call in 7 to 10 days with an update.  If no improvement in dysphagia will proceed with a barium swallow with tablet  #2. GERD, asymptomatic on daily PPI.   #3. Colon cancer screening. She complete and mailed Cologuard but company apparently never received specimen.  -reorder Cologuard. She hasn't been interested in colonoscopy.   #4. Occasional constipation. Manages well with daily stool softener and Miralax as needed.     Addendum 09/08/2011.  I received some records from PCP.  Of note labs drawn 07/08/2017 showed BUN at 38, creatinine 1.52.  Normal liver chemistries. At time of visit we discussed colon cancer screening.  She was opposed to a colonoscopy.  The interim, Cologuard has returned as positive.  We will contact her to reconsider having colonoscopy.  She is on Plavix and this would need to be held prior to the procedure  HPI:    Chief Complaint: dysphagia   Patient is a 72 year old female with multiple medical problems not limited to CAD/stent, AICD, CHF  She is known to Dr. Silverio Decamp.  She has a history of IBS and has previously been evaluated for upper abdominal discomfort, early satiety and bloating.   Patient comes in for evaluation of intermittent dysphagia present for the last 2 to 3 months.  She recalls having problems swallowing food many years ago but does not remember what ever happened with the work-up or treatment.  He did have an EGD for dysphasia in 2004.  Esophagus was normal, status post empirical dilation She mainly has problems with rice, popcorn, and steak.  No odynophagia.  She  complains of a hoarse voice.  She gives a history of GERD but is asymptomatic on daily PPI.  Review of systems:     No chest pain, no SOB, no fevers, no urinary sx   Past Medical History:  Diagnosis Date  . AICD (automatic cardioverter/defibrillator) present   . Anemia   . Arthritis   . Asthma   . CAD (coronary artery disease)    a. 10/09/16 LHC: SVG->LAD patent, SVG->Diag patent, SVG->RCA patent, SVG->LCx occluded. EF 60%, b. 10/31/16 LHC DES to AV groove Circ, DES to intermed branch  . Cellulitis and abscess of foot 12/2014   RT FOOT  . CHF (congestive heart failure) ([place])   . Chronic bronchitis ([place])   . Chronic lower back pain   . Diverticulosis   . Facial numbness 02/2016  . Fatty liver disease, nonalcoholic   . Gastritis   . GERD (gastroesophageal reflux disease)   . Gout   . H/O hiatal hernia   . High cholesterol   . Hyperplastic colon polyp   . Hypertension   . IBS (irritable bowel syndrome)   . Internal hemorrhoids   . Ischemic cardiomyopathy   . Obesity   . On home oxygen therapy    "2L at night" (10/31/2016)  . OSA (obstructive sleep apnea)    "can't tolerate a mask" (10/30/2016)  . Pneumonia    "couple times" (10/31/2016)  . Shortness of breath   . TIA (transient ischemic attack)    "recently" (  10/31/2016)  . Type II diabetes mellitus (HCC)     Patient's surgical history, family medical history, social history, medications and allergies were all reviewed in Epic   Creatinine clearance cannot be calculated (Patient's most recent lab result is older than the maximum 21 days allowed.)   Physical Exam:     BP (!) 90/58   Pulse 64   Ht 5\' 2"  (1.575 m)   Wt 253 lb 6.4 oz (114.9 kg)   BMI 46.35 kg/m   GENERAL:  Pleasant female in NAD PSYCH: : Cooperative, normal affect EENT:  conjunctiva pink, mucous membranes moist, neck supple without masses. No evidence of oral monilial infection CARDIAC:  RRR, murmur heard, no peripheral edema PULM: Normal respiratory  effort, lungs CTA bilaterally, no wheezing ABDOMEN:  Nondistended, soft, nontender. No obvious masses, no hepatomegaly,  normal bowel sounds SKIN:  turgor, no lesions seen Musculoskeletal:  Normal muscle tone, normal strength NEURO: Alert and oriented x 3, no focal neurologic deficits   Tye Savoy , NP 07/21/2017, 3:14 PM

## 2017-07-21 NOTE — Patient Instructions (Addendum)
If you are age 73 or older, your body mass index should be between 23-30. Your Body mass index is 46.35 kg/m. If this is out of the aforementioned range listed, please consider follow up with your Primary Care Provider.  If you are age 69 or younger, your body mass index should be between 19-25. Your Body mass index is 46.35 kg/m. If this is out of the aformentioned range listed, please consider follow up with your Primary Care Provider.   We have sent the following medications to your pharmacy for you to pick up at your convenience: Diflucan  Call my nurse, Beth with an update in 7-10 days.  Thank you for choosing me and [facility].   Tye Savoy, NP   Your provider has ordered Cologuard testing as an option for colon cancer screening. This is performed by [facility] and may be out of network with your insurance. PRIOR to completing the test, it is YOUR responsibility to contact your insurance about covered benefits for this test. Your out of pocket expense could be anywhere from $0.00 to $649.00.   When you call to check coverage with your insurer, please provide the following information:   -The ONLY provider of Cologuard is [place] code for Cologuard is [phone].  [employer] NPI # [id number]  -[employer] Tax ID # [id number]   We have already sent your demographic and insurance information to [facility] (phone number [phone]) and they should contact you within the next week regarding your test. If you have not heard from them within the next week, please call our office at [phone].

## 2017-07-22 ENCOUNTER — Other Ambulatory Visit: Payer: Self-pay

## 2017-07-22 ENCOUNTER — Encounter: Payer: Self-pay | Admitting: Nurse Practitioner

## 2017-07-22 MED ORDER — TORSEMIDE 20 MG PO TABS: 40.0000 mg | ORAL_TABLET | Freq: Every day | ORAL | 3 refills | Status: DC

## 2017-07-22 MED ORDER — TORSEMIDE 20 MG PO TABS: 60.0000 mg | ORAL_TABLET | Freq: Every day | ORAL | 3 refills | Status: DC

## 2017-07-22 NOTE — Progress Notes (Signed)
SIG EX: 40...60...40..[name]60  #/30D,  #135/90

## 2017-07-24 NOTE — Progress Notes (Signed)
Reviewed and agree with documentation and assessment and plan. K. Veena Annaclaire Walsworth , MD   

## 2017-07-26 DIAGNOSIS — I5032 Chronic diastolic (congestive) heart failure: Secondary | ICD-10-CM | POA: Diagnosis not present

## 2017-07-26 DIAGNOSIS — R0602 Shortness of breath: Secondary | ICD-10-CM | POA: Diagnosis not present

## 2017-07-26 DIAGNOSIS — J9611 Chronic respiratory failure with hypoxia: Secondary | ICD-10-CM | POA: Diagnosis not present

## 2017-07-26 DIAGNOSIS — J4520 Mild intermittent asthma, uncomplicated: Secondary | ICD-10-CM | POA: Diagnosis not present

## 2017-07-26 DIAGNOSIS — I10 Essential (primary) hypertension: Secondary | ICD-10-CM | POA: Diagnosis not present

## 2017-07-26 DIAGNOSIS — R269 Unspecified abnormalities of gait and mobility: Secondary | ICD-10-CM | POA: Diagnosis not present

## 2017-07-27 DIAGNOSIS — M10072 Idiopathic gout, left ankle and foot: Secondary | ICD-10-CM | POA: Diagnosis not present

## 2017-07-27 DIAGNOSIS — N189 Chronic kidney disease, unspecified: Secondary | ICD-10-CM | POA: Diagnosis not present

## 2017-07-27 DIAGNOSIS — M79672 Pain in left foot: Secondary | ICD-10-CM | POA: Diagnosis not present

## 2017-07-27 DIAGNOSIS — M25572 Pain in left ankle and joints of left foot: Secondary | ICD-10-CM | POA: Diagnosis not present

## 2017-07-27 DIAGNOSIS — S92515A Nondisplaced fracture of proximal phalanx of left lesser toe(s), initial encounter for closed fracture: Secondary | ICD-10-CM | POA: Diagnosis not present

## 2017-07-27 DIAGNOSIS — M19072 Primary osteoarthritis, left ankle and foot: Secondary | ICD-10-CM | POA: Diagnosis not present

## 2017-07-27 DIAGNOSIS — M109 Gout, unspecified: Secondary | ICD-10-CM | POA: Diagnosis not present

## 2017-08-05 ENCOUNTER — Other Ambulatory Visit (HOSPITAL_COMMUNITY): Payer: Self-pay | Admitting: Internal Medicine

## 2017-08-05 DIAGNOSIS — I10 Essential (primary) hypertension: Secondary | ICD-10-CM | POA: Diagnosis not present

## 2017-08-05 DIAGNOSIS — J4520 Mild intermittent asthma, uncomplicated: Secondary | ICD-10-CM | POA: Diagnosis not present

## 2017-08-05 DIAGNOSIS — N183 Chronic kidney disease, stage 3 (moderate): Secondary | ICD-10-CM | POA: Diagnosis not present

## 2017-08-05 DIAGNOSIS — I2510 Atherosclerotic heart disease of native coronary artery without angina pectoris: Secondary | ICD-10-CM | POA: Diagnosis not present

## 2017-08-05 DIAGNOSIS — I5032 Chronic diastolic (congestive) heart failure: Secondary | ICD-10-CM | POA: Diagnosis not present

## 2017-08-05 DIAGNOSIS — E1122 Type 2 diabetes mellitus with diabetic chronic kidney disease: Secondary | ICD-10-CM | POA: Diagnosis not present

## 2017-08-11 DIAGNOSIS — N183 Chronic kidney disease, stage 3 (moderate): Secondary | ICD-10-CM | POA: Diagnosis not present

## 2017-08-17 DIAGNOSIS — Z1211 Encounter for screening for malignant neoplasm of colon: Secondary | ICD-10-CM | POA: Diagnosis not present

## 2017-08-17 DIAGNOSIS — Z1212 Encounter for screening for malignant neoplasm of rectum: Secondary | ICD-10-CM | POA: Diagnosis not present

## 2017-08-18 ENCOUNTER — Other Ambulatory Visit (HOSPITAL_COMMUNITY): Payer: Self-pay | Admitting: *Deleted

## 2017-08-18 ENCOUNTER — Ambulatory Visit (INDEPENDENT_AMBULATORY_CARE_PROVIDER_SITE_OTHER): Payer: Medicare HMO | Admitting: *Deleted

## 2017-08-18 DIAGNOSIS — I428 Other cardiomyopathies: Secondary | ICD-10-CM

## 2017-08-18 DIAGNOSIS — I5032 Chronic diastolic (congestive) heart failure: Secondary | ICD-10-CM

## 2017-08-18 MED ORDER — TORSEMIDE 20 MG PO TABS: ORAL_TABLET | ORAL | 3 refills | Status: DC

## 2017-08-18 NOTE — Progress Notes (Signed)
Remote pacemaker transmission.   

## 2017-08-24 ENCOUNTER — Ambulatory Visit (HOSPITAL_COMMUNITY)
Admission: RE | Admit: 2017-08-24 | Discharge: 2017-08-24 | Disposition: A | Discharge status: Home / Self Care | Payer: Medicare HMO | Source: Ambulatory Visit | Attending: Cardiology | Admitting: Cardiology

## 2017-08-24 VITALS — HR 66

## 2017-08-24 DIAGNOSIS — I5033 Acute on chronic diastolic (congestive) heart failure: Secondary | ICD-10-CM | POA: Insufficient documentation

## 2017-08-24 DIAGNOSIS — R0602 Shortness of breath: Secondary | ICD-10-CM | POA: Diagnosis not present

## 2017-08-24 DIAGNOSIS — Z9981 Dependence on supplemental oxygen: Secondary | ICD-10-CM | POA: Insufficient documentation

## 2017-08-24 DIAGNOSIS — Z950 Presence of cardiac pacemaker: Secondary | ICD-10-CM | POA: Diagnosis not present

## 2017-08-24 DIAGNOSIS — I5030 Unspecified diastolic (congestive) heart failure: Secondary | ICD-10-CM | POA: Diagnosis not present

## 2017-08-24 DIAGNOSIS — I5022 Chronic systolic (congestive) heart failure: Secondary | ICD-10-CM

## 2017-08-24 NOTE — Progress Notes (Signed)
SATURATION QUALIFICATIONS: (This note is used to comply with regulatory documentation for home oxygen)  Patient Saturations on Room Air at Rest = 89%  Patient Saturations on Room Air while Ambulating = 79%  Patient Saturations on 2 Liters of oxygen while Ambulating =95%  Please briefly explain why patient needs home oxygen: Patient experiences shortness of breath throughout the day especially while ambulating.Patient checks her O2 saturations and notices she drops into the 80s while at rest. She has O2 for night use and has recently had to use O2 throughout the day.

## 2017-08-25 DIAGNOSIS — R0602 Shortness of breath: Secondary | ICD-10-CM | POA: Diagnosis not present

## 2017-08-25 DIAGNOSIS — I10 Essential (primary) hypertension: Secondary | ICD-10-CM | POA: Diagnosis not present

## 2017-08-25 DIAGNOSIS — I5032 Chronic diastolic (congestive) heart failure: Secondary | ICD-10-CM | POA: Diagnosis not present

## 2017-08-25 DIAGNOSIS — J4520 Mild intermittent asthma, uncomplicated: Secondary | ICD-10-CM | POA: Diagnosis not present

## 2017-08-25 DIAGNOSIS — J9611 Chronic respiratory failure with hypoxia: Secondary | ICD-10-CM | POA: Diagnosis not present

## 2017-08-25 DIAGNOSIS — R269 Unspecified abnormalities of gait and mobility: Secondary | ICD-10-CM | POA: Diagnosis not present

## 2017-08-25 IMAGING — DX DG CHEST 2V
2 series · 2 of 2 positions shown · non-contrast
Comparison: 05/06/2016

CLINICAL DATA: Intermittent short of breath for 2 month

EXAM:
CHEST  2 VIEW

[chest pa]
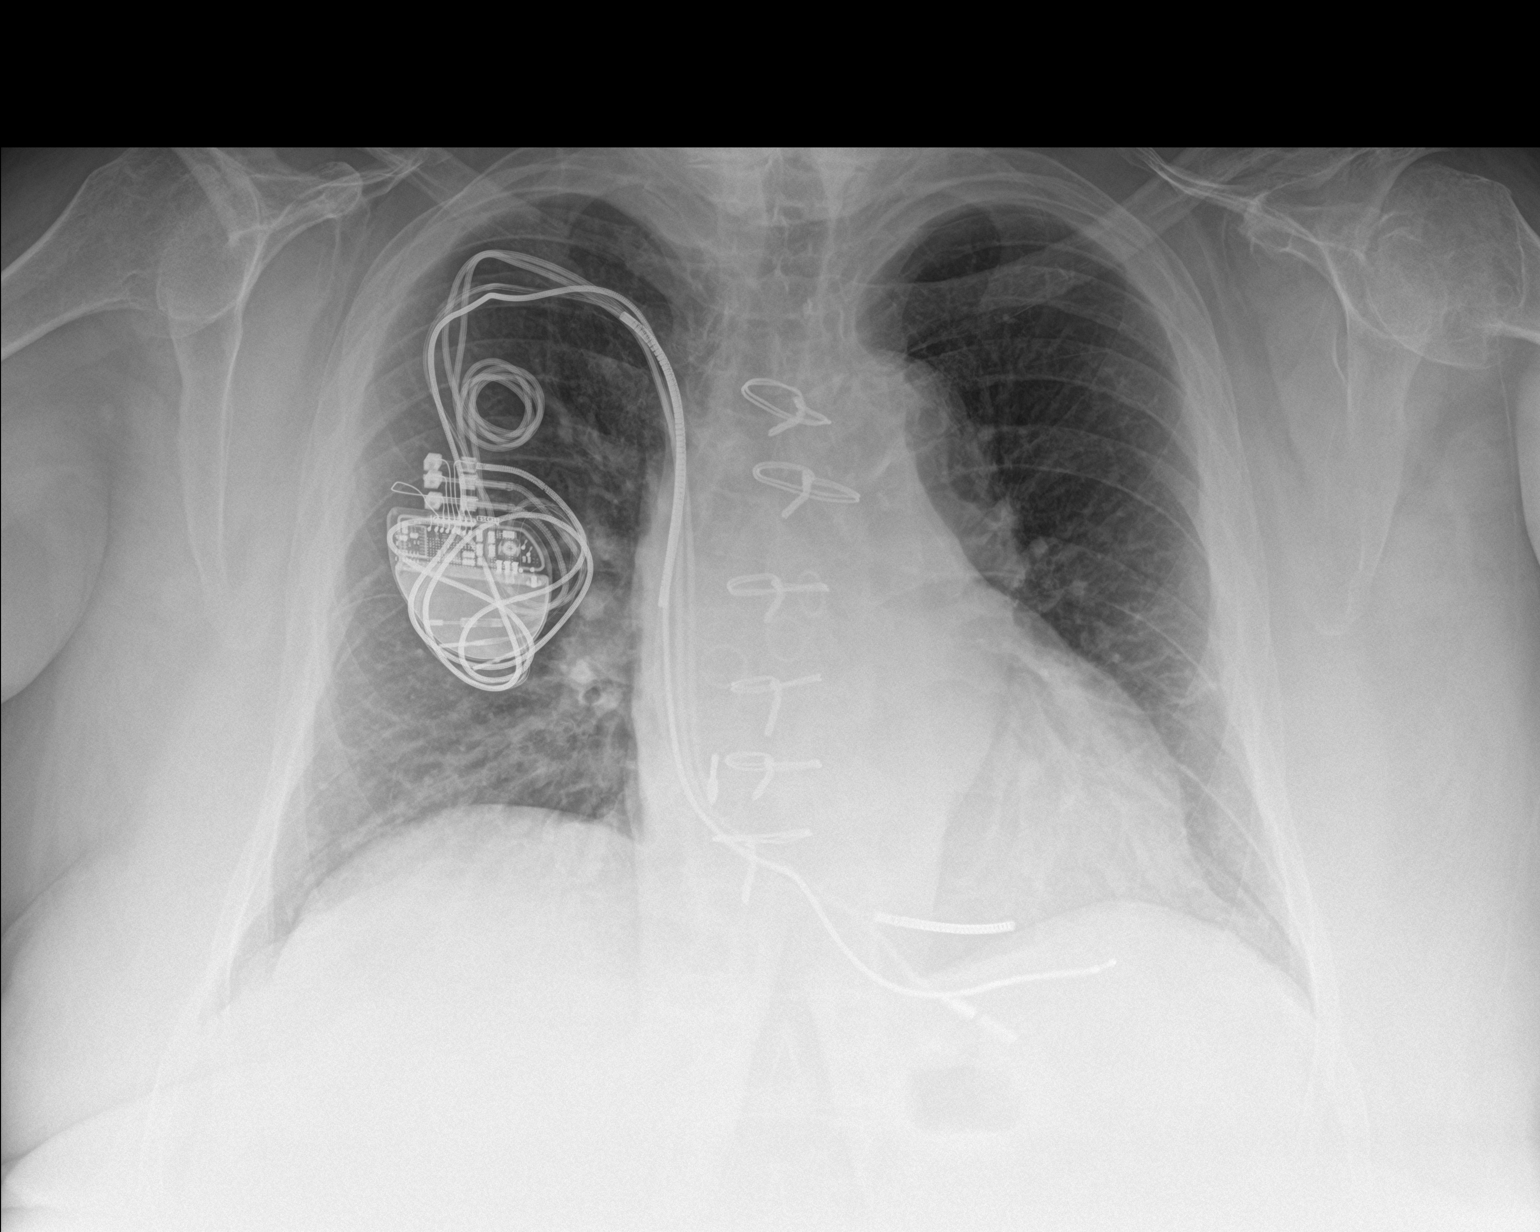

[chest lat]
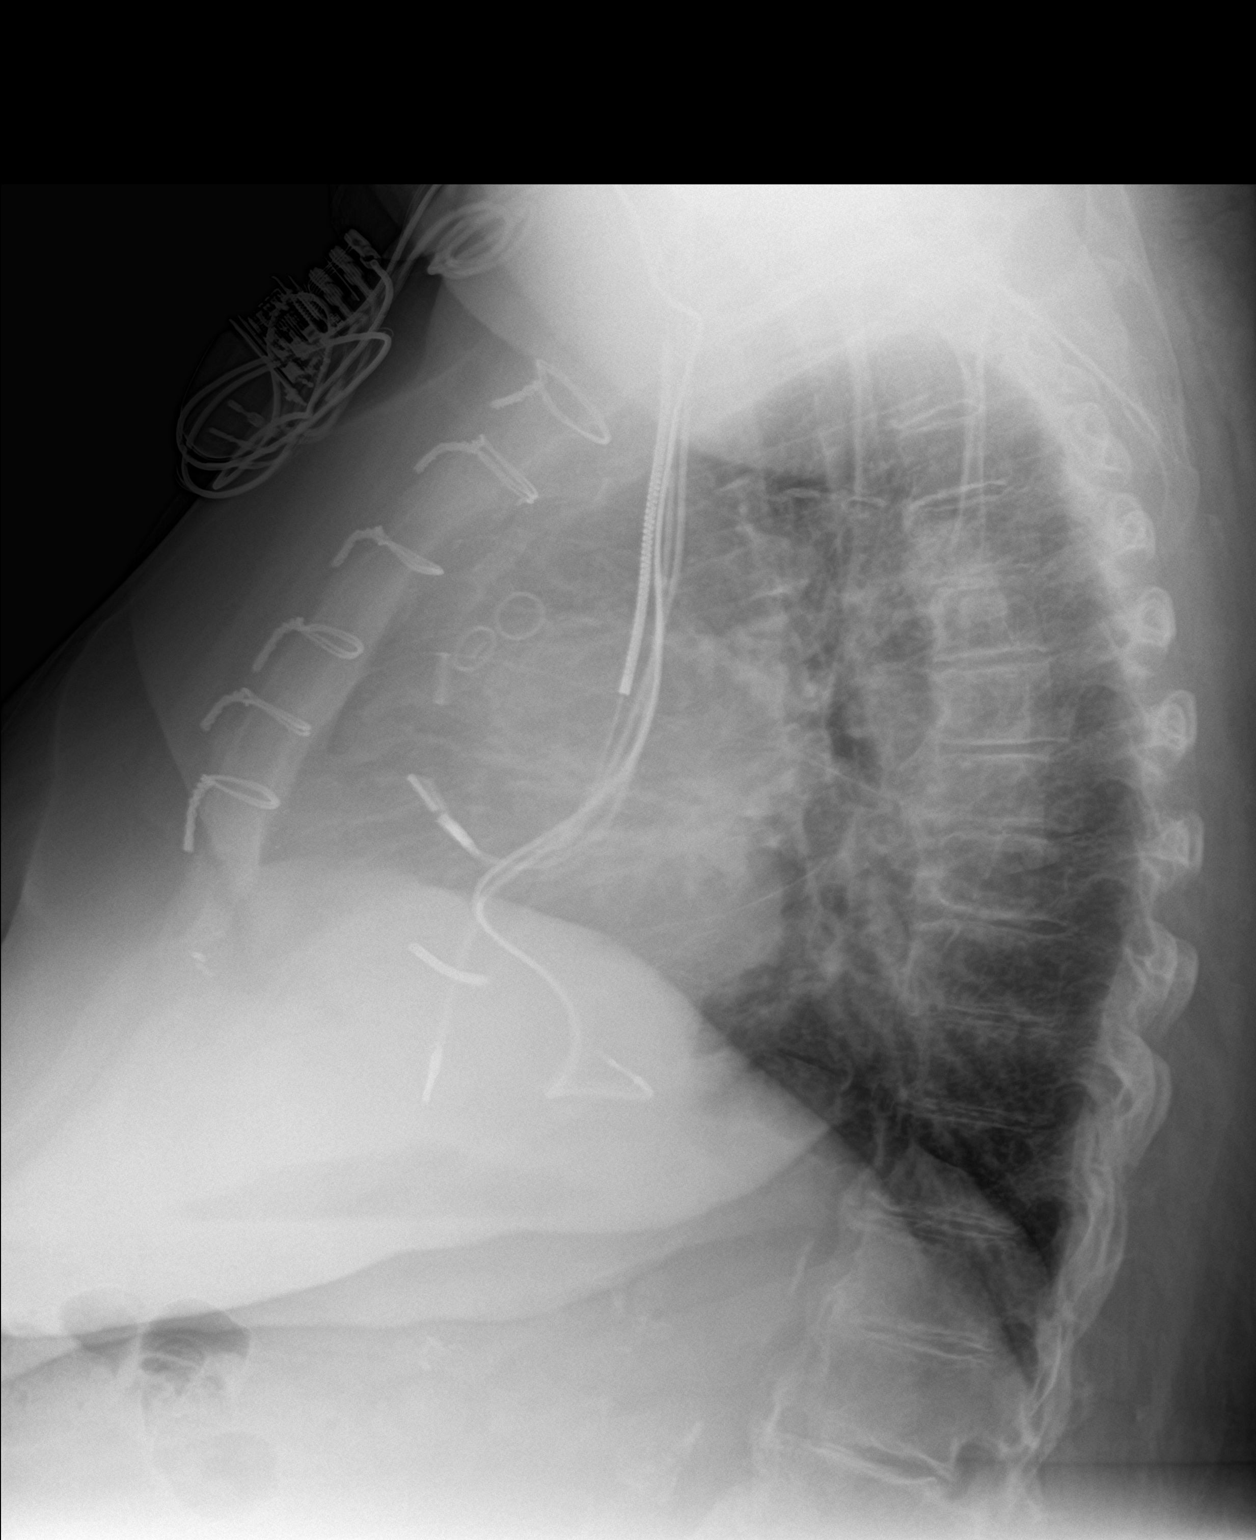

[2 of 2 positions shown; findings below may reference images not displayed]

FINDINGS: Right subclavian AICD device and leads are stable and intact. The
heart is upper normal in size. Lungs are under aerated and clear. No
pneumothorax or pleural effusion. Stable deformity of the proximal
left humerus which has a chronic appearance.
IMPRESSION: No active cardiopulmonary disease.

## 2017-08-26 ENCOUNTER — Other Ambulatory Visit: Payer: Self-pay

## 2017-08-26 LAB — COLOGUARD: Cologuard: POSITIVE

## 2017-08-28 ENCOUNTER — Ambulatory Visit
Admission: RE | Admit: 2017-08-28 | Discharge: 2017-08-28 | Disposition: A | Discharge status: Home / Self Care | Payer: Medicare HMO | Source: Ambulatory Visit | Attending: Family Medicine | Admitting: Family Medicine

## 2017-08-28 ENCOUNTER — Other Ambulatory Visit: Payer: Self-pay | Admitting: Family Medicine

## 2017-08-28 DIAGNOSIS — J0100 Acute maxillary sinusitis, unspecified: Secondary | ICD-10-CM | POA: Diagnosis not present

## 2017-08-28 DIAGNOSIS — I5032 Chronic diastolic (congestive) heart failure: Secondary | ICD-10-CM | POA: Diagnosis not present

## 2017-08-28 DIAGNOSIS — I2510 Atherosclerotic heart disease of native coronary artery without angina pectoris: Secondary | ICD-10-CM | POA: Diagnosis not present

## 2017-08-28 DIAGNOSIS — R079 Chest pain, unspecified: Secondary | ICD-10-CM

## 2017-08-28 DIAGNOSIS — J45909 Unspecified asthma, uncomplicated: Secondary | ICD-10-CM | POA: Diagnosis not present

## 2017-09-04 ENCOUNTER — Encounter (INDEPENDENT_AMBULATORY_CARE_PROVIDER_SITE_OTHER): Payer: Self-pay | Admitting: Orthopaedic Surgery

## 2017-09-04 ENCOUNTER — Ambulatory Visit (INDEPENDENT_AMBULATORY_CARE_PROVIDER_SITE_OTHER): Payer: Medicare HMO | Admitting: Orthopaedic Surgery

## 2017-09-04 ENCOUNTER — Ambulatory Visit (INDEPENDENT_AMBULATORY_CARE_PROVIDER_SITE_OTHER): Payer: Medicare HMO

## 2017-09-04 ENCOUNTER — Other Ambulatory Visit (INDEPENDENT_AMBULATORY_CARE_PROVIDER_SITE_OTHER): Payer: Self-pay | Admitting: Orthopaedic Surgery

## 2017-09-04 VITALS — BP 137/70 | HR 66 | Ht 62.0 in | Wt 253.0 lb

## 2017-09-04 DIAGNOSIS — R0602 Shortness of breath: Secondary | ICD-10-CM | POA: Diagnosis not present

## 2017-09-04 DIAGNOSIS — G8929 Other chronic pain: Secondary | ICD-10-CM

## 2017-09-04 DIAGNOSIS — M542 Cervicalgia: Secondary | ICD-10-CM

## 2017-09-04 DIAGNOSIS — M545 Low back pain: Secondary | ICD-10-CM

## 2017-09-04 DIAGNOSIS — J9611 Chronic respiratory failure with hypoxia: Secondary | ICD-10-CM | POA: Diagnosis not present

## 2017-09-04 DIAGNOSIS — I10 Essential (primary) hypertension: Secondary | ICD-10-CM | POA: Diagnosis not present

## 2017-09-04 DIAGNOSIS — I5032 Chronic diastolic (congestive) heart failure: Secondary | ICD-10-CM | POA: Diagnosis not present

## 2017-09-04 DIAGNOSIS — R269 Unspecified abnormalities of gait and mobility: Secondary | ICD-10-CM | POA: Diagnosis not present

## 2017-09-04 DIAGNOSIS — J4520 Mild intermittent asthma, uncomplicated: Secondary | ICD-10-CM | POA: Diagnosis not present

## 2017-09-07 ENCOUNTER — Other Ambulatory Visit: Payer: Self-pay | Admitting: Cardiovascular Disease

## 2017-09-07 ENCOUNTER — Other Ambulatory Visit: Payer: Self-pay | Admitting: Cardiology

## 2017-09-08 ENCOUNTER — Encounter (INDEPENDENT_AMBULATORY_CARE_PROVIDER_SITE_OTHER): Payer: Self-pay | Admitting: Orthopaedic Surgery

## 2017-09-08 ENCOUNTER — Ambulatory Visit (INDEPENDENT_AMBULATORY_CARE_PROVIDER_SITE_OTHER): Payer: Medicare HMO | Admitting: Orthopaedic Surgery

## 2017-09-08 DIAGNOSIS — I10 Essential (primary) hypertension: Secondary | ICD-10-CM | POA: Diagnosis not present

## 2017-09-08 DIAGNOSIS — I5032 Chronic diastolic (congestive) heart failure: Secondary | ICD-10-CM | POA: Diagnosis not present

## 2017-09-08 DIAGNOSIS — N183 Chronic kidney disease, stage 3 (moderate): Secondary | ICD-10-CM | POA: Diagnosis not present

## 2017-09-08 DIAGNOSIS — I5033 Acute on chronic diastolic (congestive) heart failure: Secondary | ICD-10-CM | POA: Diagnosis not present

## 2017-09-08 DIAGNOSIS — I2510 Atherosclerotic heart disease of native coronary artery without angina pectoris: Secondary | ICD-10-CM | POA: Diagnosis not present

## 2017-09-08 DIAGNOSIS — E1122 Type 2 diabetes mellitus with diabetic chronic kidney disease: Secondary | ICD-10-CM | POA: Diagnosis not present

## 2017-09-08 DIAGNOSIS — J4520 Mild intermittent asthma, uncomplicated: Secondary | ICD-10-CM | POA: Diagnosis not present

## 2017-09-08 DIAGNOSIS — E1169 Type 2 diabetes mellitus with other specified complication: Secondary | ICD-10-CM | POA: Diagnosis not present

## 2017-09-08 NOTE — Progress Notes (Signed)
Office Visit Note   Patient: [name]           Date of Birth: 1945/04/22           MRN: [id number] Visit Date: 09/04/2017              Requested by: Darcus Austin, MD [address], [place] [address] PCP: Darcus Austin, MD   Assessment & Plan: Visit Diagnoses:  1. Neck pain   2. Chronic bilateral low back pain, with sciatica presence unspecified     Plan: We will proceed with CT scan lumbar spine for evaluation of her claudication symptoms.  Office follow-up after scan.  Follow-Up Instructions: No follow-ups on file.   Orders:  Orders Placed This Encounter  Procedures  . XR Cervical Spine 2 or 3 views  . XR Lumbar Spine 2-3 Views   No orders of the defined types were placed in this encounter.     Procedures: No procedures performed   Clinical Data: No additional findings.   Subjective: Chief Complaint  Patient presents with  . Neck - Pain  . Lower Back - Pain    HPI 72 year old female seen with left shoulder pain left neck pain and low back pain that radiates from her back up to her neck.  She states she has been having trouble walking cannot go very far part of this is related to pulmonary problems on home oxygen and has a portable oxygen machine coming shortly.  She also has heart failure has pacemaker present.  She has some numbness and tingling that radiates to her hands it wakes her up at night.  She has not gotten relief from Tylenol she did take an ibuprofen which helped but she is not supposed to take it due to her heart failure.  She states she can only stand for 3 or 4 minutes and has pain with attempts and ambulate more than a few 100 feet.  Difficult to determine if this is related to cardiac, pulmonary or claudication.  Review of Systems 14 point review of systems updated from 821/2018 other wise unchanged other than as mentioned in in HPI.  She does have history of gout, COPD, coronary artery disease, heart failure, CABG  x5.  Positive for gout with podagra.   Objective: Vital Signs: BP 137/70   Pulse 66   Ht 5\' 2"  (1.575 m)   Wt 253 lb (114.8 kg)   BMI 46.27 kg/m   Physical Exam  Constitutional: She is oriented to person, place, and time. She appears well-developed.  HENT:  Head: Normocephalic.  Right Ear: External ear normal.  Left Ear: External ear normal.  Eyes: Pupils are equal, round, and reactive to light.  Neck: No tracheal deviation present. No thyromegaly present.  Cardiovascular: Normal rate.  Pulmonary/Chest: Effort normal.  Abdominal: Soft.  Neurological: She is alert and oriented to person, place, and time.  Skin: Skin is warm and dry.  Psychiatric: She has a normal mood and affect. Her behavior is normal.    Ortho Exam patient has tenderness over the lumbar spine mild positive impingement left shoulder.  Well-healed anterior cervical incision.  No thenar atrophy.  Patient ambulates with a cane at times.  Upper extremity reflexes are symmetrical.  Bilateral pitting edema lower extremities.  She has sciatic notch tenderness pain with straight leg raising 90 degrees.  Specialty Comments:  No specialty comments available.  Imaging: No results found.   PMFS History: Patient Active Problem List  Diagnosis Date Noted  . Vasomotor rhinitis 10/16/2016  . Right facial numbness 03/05/2016  . CKD (chronic kidney disease), stage III ([place]) 03/05/2016  . Atrial fibrillation ([facility]) 10/24/2015  . Chest pain with moderate risk of acute coronary syndrome 10/23/2015  . History of TIA (transient ischemic attack) 10/22/2015  . Complete heart block ([facility]) 06/22/2015  . Allergic drug rash due to anti-infective agent 04/29/2015  . Fatigue 02/14/2015  . Chronic diastolic heart failure ([place]) 02/14/2015  . Cellulitis 12/21/2014  . S/P CABG x 5 11/29/2014  . CAD S/P percutaneous coronary angioplasty   . Diarrhea 07/12/2014  . Generalized abdominal pain 07/12/2014  . Diastolic CHF, acute on  chronic (HCC) 11/04/2013  . Mixed hypercholesterolemia and hypertriglyceridemia 04/22/2013  . Vertigo 04/22/2013  . Dyspnea on exertion 08/25/2012  . Allergic to IV contrast 07/23/2012  . Unstable angina ([place]) 07/22/2012  . Morbid (severe) obesity due to excess calories ([place]) 11/22/2011  . Nonischemic cardiomyopathy ([place]) 11/22/2011  . GERD (gastroesophageal reflux disease) 11/22/2011  . Back pain 11/22/2011  . OSA (obstructive sleep apnea) 11/22/2011  . HEMORRHOIDS-EXTERNAL 02/21/2010  . NAUSEA 02/21/2010  . ABDOMINAL PAIN -GENERALIZED 02/21/2010  . PERSONAL HX COLONIC POLYPS 02/21/2010  . ANEMIA 04/16/2007  . Essential hypertension 04/16/2007  . DIVERTICULOSIS, COLON 04/16/2007  . ARTHRITIS 04/16/2007  . INTERNAL HEMORRHOIDS WITHOUT MENTION COMP 04/12/2007  . Asthma 04/12/2007  . Cardiac resynchronization therapy pacemaker (CRT-P) in place 04/12/2007  . ACUTE GASTRITIS WITHOUT MENTION OF HEMORRHAGE 12/14/2006  . COLONIC POLYPS, HYPERPLASTIC 09/27/2002  . FATTY LIVER DISEASE 02/16/2002   Past Medical History:  Diagnosis Date  . AICD (automatic cardioverter/defibrillator) present   . Anemia   . Arthritis   . Asthma   . CAD (coronary artery disease)    a. 10/09/16 LHC: SVG->LAD patent, SVG->Diag patent, SVG->RCA patent, SVG->LCx occluded. EF 60%, b. 10/31/16 LHC DES to AV groove Circ, DES to intermed branch  . Cellulitis and abscess of foot 12/2014   RT FOOT  . CHF (congestive heart failure) ([place])   . Chronic bronchitis ([place])   . Chronic lower back pain   . Diverticulosis   . Facial numbness 02/2016  . Fatty liver disease, nonalcoholic   . Gastritis   . GERD (gastroesophageal reflux disease)   . Gout   . H/O hiatal hernia   . High cholesterol   . Hyperplastic colon polyp   . Hypertension   . IBS (irritable bowel syndrome)   . Internal hemorrhoids   . Ischemic cardiomyopathy   . Obesity   . On home oxygen therapy    "2L at night" (10/31/2016)  . OSA (obstructive  sleep apnea)    "can't tolerate a mask" (10/30/2016)  . Pneumonia    "couple times" (10/31/2016)  . Shortness of breath   . TIA (transient ischemic attack)    "recently" (10/31/2016)  . Type II diabetes mellitus (HCC)     Family History  Problem Relation Age of Onset  . Breast cancer Mother   . Diabetes Mother   . Heart disease Maternal Grandmother   . Kidney disease Maternal Grandmother   . Diabetes Maternal Grandmother   . Glaucoma Maternal Aunt   . Heart disease Maternal Aunt   . Asthma Sister     Past Surgical History:  Procedure Laterality Date  . ABDOMINAL ULTRASOUND  12/01/2011   Peripelvic cysts- #1- 2.4x1.9x2.3cm, #2-1.2x0.9x1.2cm  . ANKLE FRACTURE SURGERY Right    "had rod put in"  . ANTERIOR CERVICAL DECOMP/DISCECTOMY FUSION    .  APPENDECTOMY    . BACK SURGERY    . BIV ICD INSERTION CRT-D  2001?  [name] BIV PACEMAKER GENERATOR CHANGE OUT N/A 11/02/2012   Procedure: BIV PACEMAKER GENERATOR CHANGE OUT;  Surgeon: Sanda Klein, MD;  Location: [facility] CATH [facility];  Service: Cardiovascular;  Laterality: N/A;  . CARDIAC CATHETERIZATION  05/17/1999   No significant coronary obstructive disease w/ mild 20% luminal irregularity of the first diag branch of the LAD  . CARDIAC CATHETERIZATION  07/08/2002   No significant CAD, moderately depressed LV systolic function  . CARDIAC CATHETERIZATION Bilateral 04/26/2007   Normal findings, recommend medical therapy  . CARDIAC CATHETERIZATION  02/18/2008   Moderate CAD, would benefit from having a functional study, recommend continue medical therapy  . CARDIAC CATHETERIZATION  07/23/2012   Medical therapy  . CARDIAC CATHETERIZATION N/A 11/24/2014   Procedure: Left Heart Cath and Coronary Angiography;  Surgeon: Troy Sine, MD;  Location: [facility];  Service: Cardiovascular;  Laterality: N/A;  . CARDIAC CATHETERIZATION  11/27/2014   Procedure: Intravascular Pressure Wire/FFR Study;  Surgeon: Peter M Martinique, MD;  Location: [facility];   Service: Cardiovascular;;  . CARDIAC CATHETERIZATION  10/09/2016  . CORONARY ANGIOGRAM  2010  . CORONARY ARTERY BYPASS GRAFT N/A 11/29/2014   Procedure: CORONARY ARTERY BYPASS GRAFTING x 5 (LIMA-LAD, SVG-D, SVG-OM1-OM2, SVG-PD);  Surgeon: Melrose Nakayama, MD;  Location: [facility];  Service: Open Heart Surgery;  Laterality: N/A;  . CORONARY STENT INTERVENTION N/A 10/31/2016   Procedure: CORONARY STENT INTERVENTION;  Surgeon: Burnell Blanks, MD;  Location: [facility];  Service: Cardiovascular;  Laterality: N/A;  . FRACTURE SURGERY    . INSERT / REPLACE / [place]  . KNEE ARTHROSCOPY Bilateral   . LEFT HEART CATHETERIZATION WITH CORONARY ANGIOGRAM N/A 07/23/2012   Procedure: LEFT HEART CATHETERIZATION WITH CORONARY ANGIOGRAM;  Surgeon: Leonie Man, MD;  Location: [facility] CATH LAB;  Service: Cardiovascular;  Laterality: N/A;  Carlton Adam MYOVIEW  11/14/2011   Mild-moderate defect seen in Mid Inferolateral and Mid Anterolateral regions-consistant w/ infarct/scar. No significant ischemia demonstrated.  [name] RIGHT/LEFT HEART CATH AND CORONARY ANGIOGRAPHY N/A 10/09/2016   Procedure: RIGHT/LEFT HEART CATH AND CORONARY ANGIOGRAPHY;  Surgeon: Jolaine Artist, MD;  Location: [facility];  Service: Cardiovascular;  Laterality: N/A;  . TEE WITHOUT CARDIOVERSION N/A 11/29/2014   Procedure: TRANSESOPHAGEAL ECHOCARDIOGRAM (TEE);  Surgeon: Melrose Nakayama, MD;  Location: [facility];  Service: Open Heart Surgery;  Laterality: N/A;  . TRANSTHORACIC ECHOCARDIOGRAM  07/23/2012   EF 55-60%, normal-mild  . TUBAL LIGATION     Social History   Occupational History  . Occupation: STAFF/BUFFET    Employer: [employer]  Tobacco Use  . Smoking status: Former Smoker    Packs/day: 0.25    Years: 3.00    Pack years: 0.75    Types: Cigarettes    Last attempt to quit: 02/11/1968    Years since quitting: 49.6  . Smokeless tobacco: Never Used  Substance and Sexual Activity    . Alcohol use: No  . Drug use: No  . Sexual activity: Never

## 2017-09-08 NOTE — Telephone Encounter (Signed)
Rx request sent to pharmacy.  

## 2017-09-14 ENCOUNTER — Other Ambulatory Visit: Payer: Self-pay | Admitting: Cardiovascular Disease

## 2017-09-14 IMAGING — CR DG CHEST 2V
2 series · 2 of 2 positions shown · non-contrast
Comparison: Chest radiograph performed 08/29/2016

CLINICAL DATA: Acute onset of shortness of breath. Initial
encounter.

EXAM:
CHEST  2 VIEW

[chest pa]
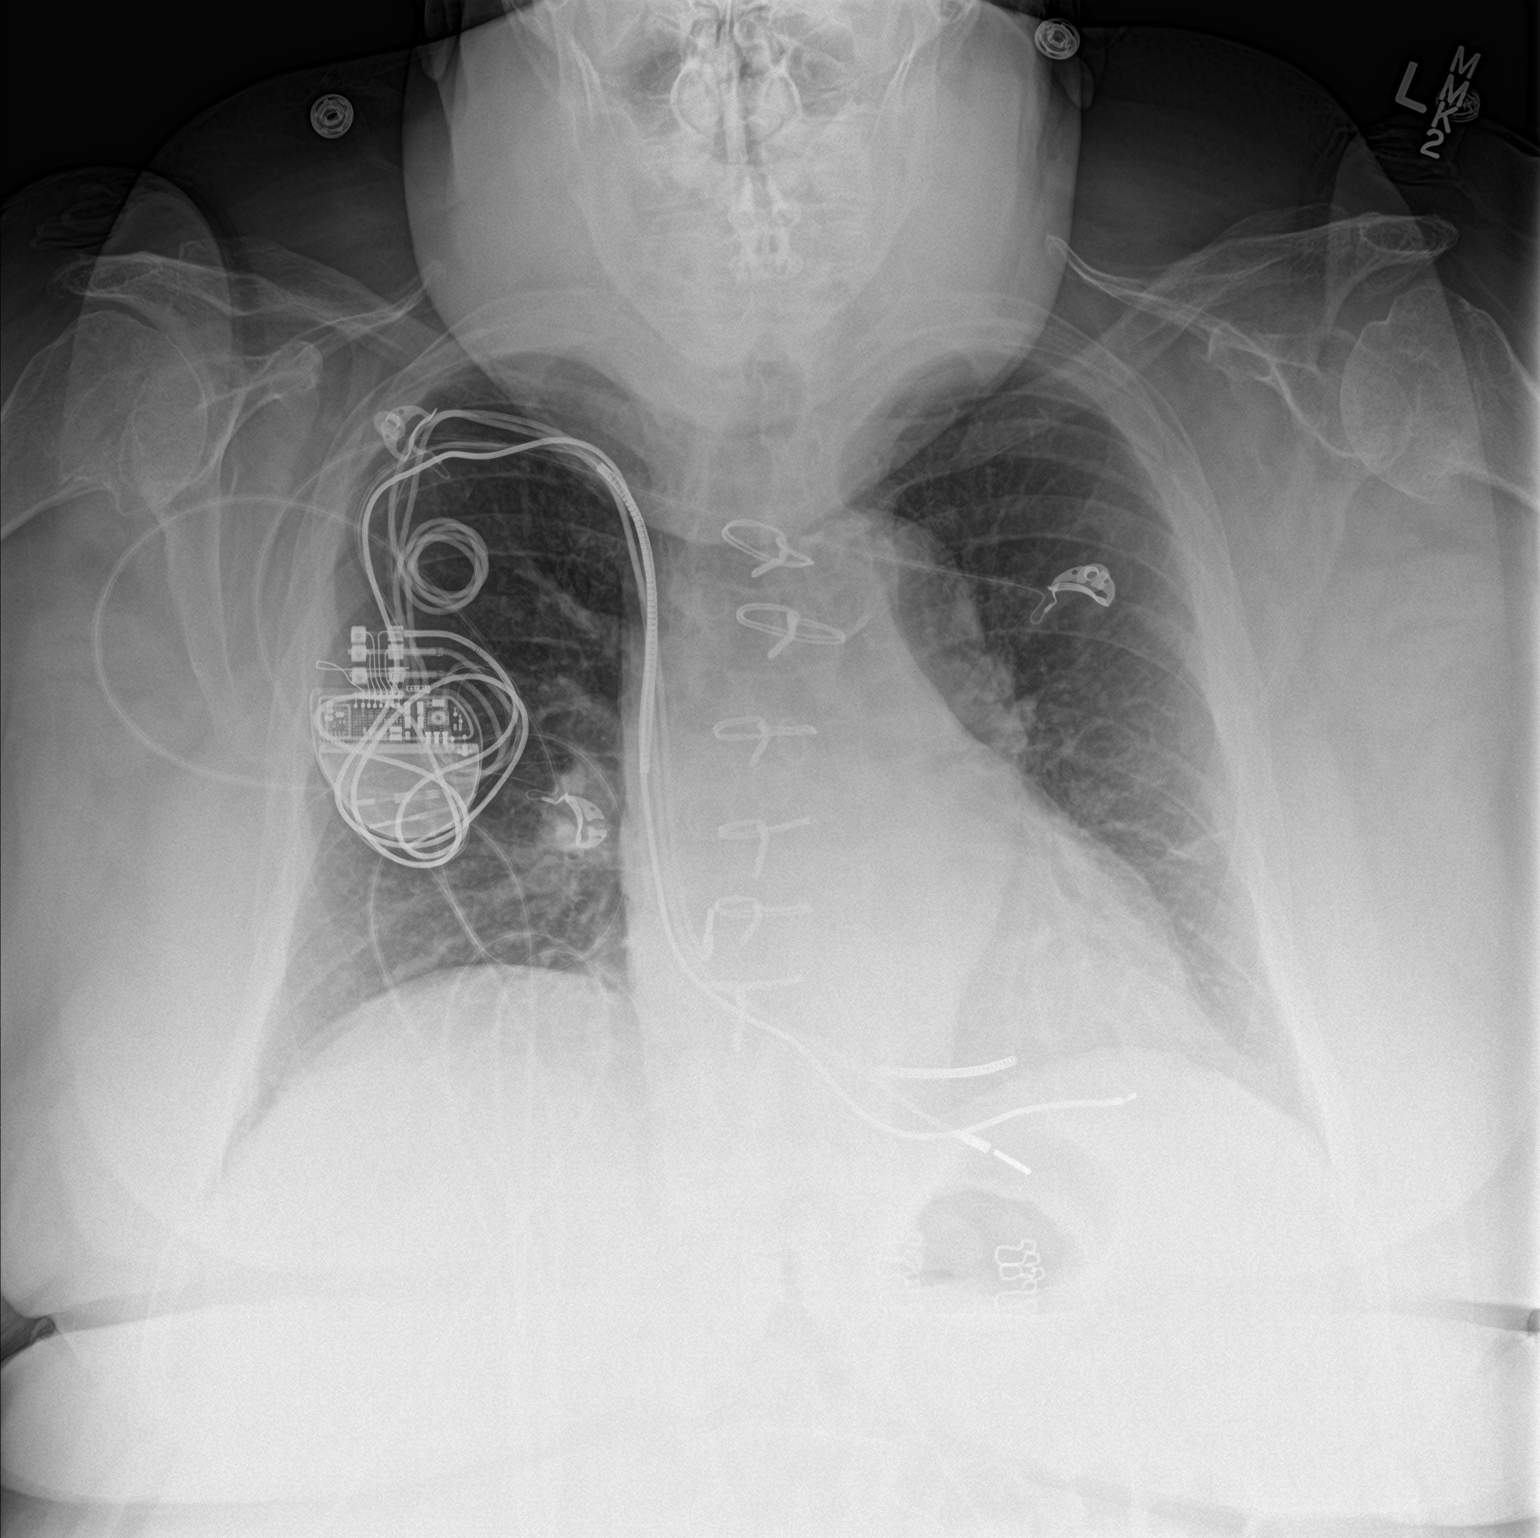

[chest lat]
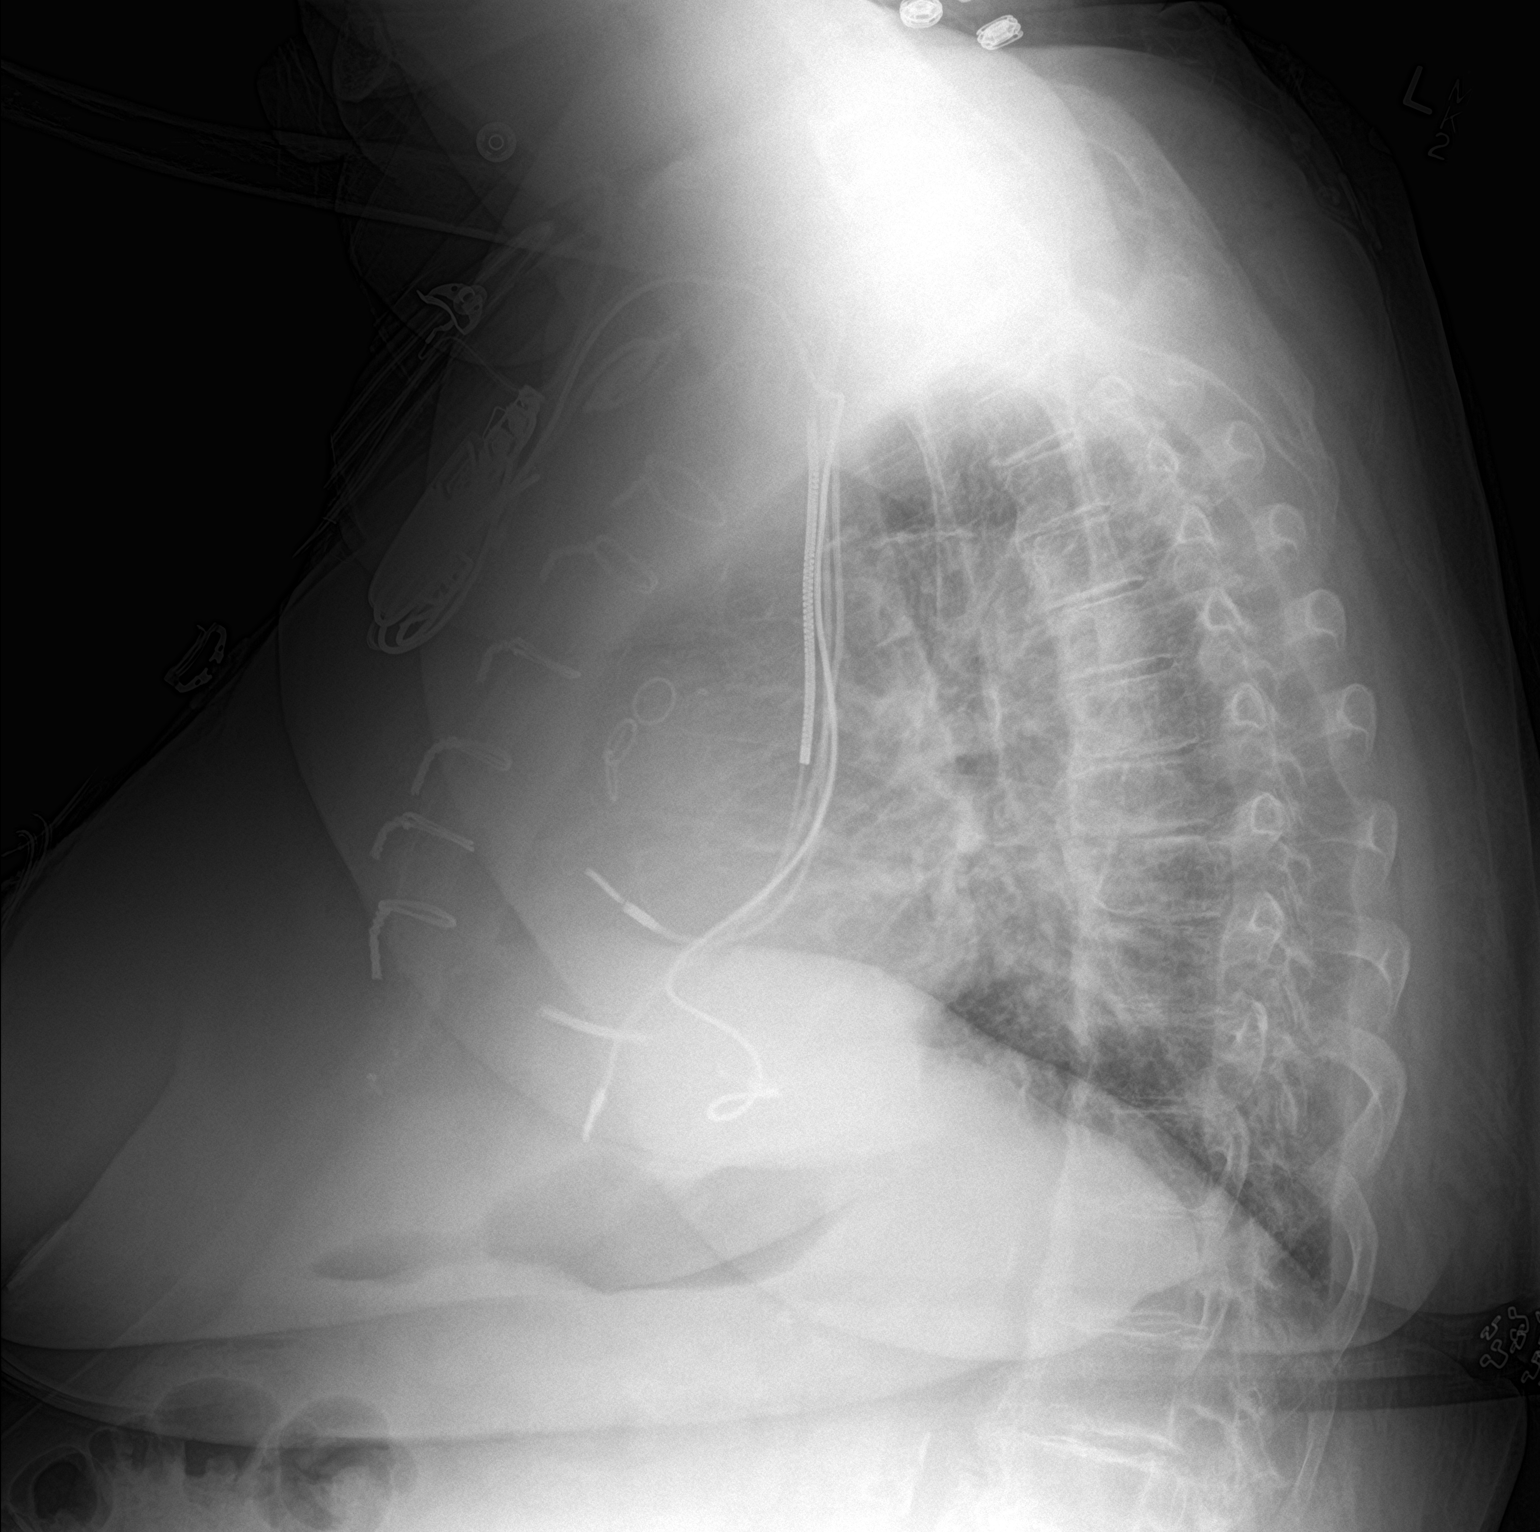

[2 of 2 positions shown; findings below may reference images not displayed]

FINDINGS: The lungs are well-aerated. Minimal bibasilar atelectasis is noted.
There is no evidence of pleural effusion or pneumothorax.

The heart is borderline normal in size. The patient is status post
median sternotomy. A pacemaker/AICD is noted at the right chest
wall, with leads ending at the right ventricle. No acute osseous
abnormalities are seen.
IMPRESSION: Minimal bibasilar atelectasis noted.  Lungs otherwise clear.

## 2017-09-15 ENCOUNTER — Encounter (INDEPENDENT_AMBULATORY_CARE_PROVIDER_SITE_OTHER): Payer: Self-pay | Admitting: *Deleted

## 2017-09-16 LAB — CUP PACEART REMOTE DEVICE CHECK
Battery Remaining Longevity: 78 mo
Battery Remaining Percentage: 100 %
Brady Statistic RA Percent Paced: 0 %
Brady Statistic RV Percent Paced: 100 %
Date Time Interrogation Session: 20190708042100
Implantable Lead Implant Date: 20080923
Implantable Lead Implant Date: 20080923
Implantable Lead Implant Date: 20080923
Implantable Lead Location: 753858
Implantable Lead Location: 753859
Implantable Lead Location: 753860
Implantable Lead Model: 4285
Implantable Lead Model: 4555
Implantable Lead Model: 7120
Implantable Lead Serial Number: 170220
Implantable Lead Serial Number: 486468
Implantable Pulse Generator Implant Date: 20140923
Lead Channel Impedance Value: 1025 Ohm
Lead Channel Impedance Value: 638 Ohm
Lead Channel Impedance Value: 787 Ohm
Lead Channel Pacing Threshold Amplitude: 0.7 V
Lead Channel Pacing Threshold Amplitude: 1.2 V
Lead Channel Pacing Threshold Pulse Width: 0.4 ms
Lead Channel Pacing Threshold Pulse Width: 0.8 ms
Lead Channel Setting Pacing Amplitude: 1.9 V
Lead Channel Setting Pacing Amplitude: 2 V
Lead Channel Setting Pacing Pulse Width: 0.4 ms
Lead Channel Setting Pacing Pulse Width: 0.8 ms
Lead Channel Setting Sensing Sensitivity: 2.5 mV
Lead Channel Setting Sensing Sensitivity: 2.5 mV
Pulse Gen Serial Number: 100731

## 2017-09-23 ENCOUNTER — Emergency Department (HOSPITAL_COMMUNITY)
Admission: EM | Admit: 2017-09-23 | Discharge: 2017-09-23 | Disposition: A | Discharge status: Home / Self Care | Payer: Medicare HMO | Attending: Emergency Medicine | Admitting: Emergency Medicine

## 2017-09-23 ENCOUNTER — Encounter (HOSPITAL_COMMUNITY): Payer: Self-pay

## 2017-09-23 ENCOUNTER — Emergency Department (HOSPITAL_COMMUNITY): Payer: Medicare HMO

## 2017-09-23 ENCOUNTER — Other Ambulatory Visit: Payer: Medicare HMO

## 2017-09-23 DIAGNOSIS — I2510 Atherosclerotic heart disease of native coronary artery without angina pectoris: Secondary | ICD-10-CM | POA: Insufficient documentation

## 2017-09-23 DIAGNOSIS — Z87891 Personal history of nicotine dependence: Secondary | ICD-10-CM | POA: Insufficient documentation

## 2017-09-23 DIAGNOSIS — R002 Palpitations: Secondary | ICD-10-CM | POA: Diagnosis not present

## 2017-09-23 DIAGNOSIS — I10 Essential (primary) hypertension: Secondary | ICD-10-CM | POA: Diagnosis not present

## 2017-09-23 DIAGNOSIS — I130 Hypertensive heart and chronic kidney disease with heart failure and stage 1 through stage 4 chronic kidney disease, or unspecified chronic kidney disease: Secondary | ICD-10-CM | POA: Diagnosis not present

## 2017-09-23 DIAGNOSIS — R0789 Other chest pain: Secondary | ICD-10-CM

## 2017-09-23 DIAGNOSIS — Z7902 Long term (current) use of antithrombotics/antiplatelets: Secondary | ICD-10-CM | POA: Diagnosis not present

## 2017-09-23 DIAGNOSIS — Z951 Presence of aortocoronary bypass graft: Secondary | ICD-10-CM | POA: Insufficient documentation

## 2017-09-23 DIAGNOSIS — E1122 Type 2 diabetes mellitus with diabetic chronic kidney disease: Secondary | ICD-10-CM | POA: Insufficient documentation

## 2017-09-23 DIAGNOSIS — Z7982 Long term (current) use of aspirin: Secondary | ICD-10-CM | POA: Insufficient documentation

## 2017-09-23 DIAGNOSIS — N183 Chronic kidney disease, stage 3 (moderate): Secondary | ICD-10-CM | POA: Diagnosis not present

## 2017-09-23 DIAGNOSIS — I200 Unstable angina: Secondary | ICD-10-CM | POA: Diagnosis not present

## 2017-09-23 DIAGNOSIS — I4891 Unspecified atrial fibrillation: Secondary | ICD-10-CM | POA: Diagnosis not present

## 2017-09-23 DIAGNOSIS — R079 Chest pain, unspecified: Secondary | ICD-10-CM | POA: Diagnosis not present

## 2017-09-23 DIAGNOSIS — R05 Cough: Secondary | ICD-10-CM

## 2017-09-23 DIAGNOSIS — Z950 Presence of cardiac pacemaker: Secondary | ICD-10-CM | POA: Diagnosis not present

## 2017-09-23 DIAGNOSIS — J40 Bronchitis, not specified as acute or chronic: Secondary | ICD-10-CM | POA: Diagnosis not present

## 2017-09-23 DIAGNOSIS — I5032 Chronic diastolic (congestive) heart failure: Secondary | ICD-10-CM | POA: Insufficient documentation

## 2017-09-23 DIAGNOSIS — R0600 Dyspnea, unspecified: Secondary | ICD-10-CM | POA: Diagnosis not present

## 2017-09-23 DIAGNOSIS — I201 Angina pectoris with documented spasm: Secondary | ICD-10-CM | POA: Diagnosis not present

## 2017-09-23 DIAGNOSIS — Z8673 Personal history of transient ischemic attack (TIA), and cerebral infarction without residual deficits: Secondary | ICD-10-CM | POA: Diagnosis not present

## 2017-09-23 DIAGNOSIS — R0602 Shortness of breath: Secondary | ICD-10-CM | POA: Diagnosis not present

## 2017-09-23 DIAGNOSIS — R058 Other specified cough: Secondary | ICD-10-CM

## 2017-09-23 LAB — BASIC METABOLIC PANEL
Anion gap: 9 (ref 5–15)
BUN: 21 mg/dL (ref 8–23)
CO2: 34 mmol/L — ABNORMAL HIGH (ref 22–32)
Calcium: 9 mg/dL (ref 8.9–10.3)
Chloride: 99 mmol/L (ref 98–111)
Creatinine, Ser: 1.41 mg/dL — ABNORMAL HIGH (ref 0.44–1.00)
GFR calc Af Amer: 42 mL/min — ABNORMAL LOW (ref >60)
GFR calc non Af Amer: 36 mL/min — ABNORMAL LOW (ref >60)
Glucose, Bld: 133 mg/dL — ABNORMAL HIGH (ref 70–99)
Potassium: 4 mmol/L (ref 3.5–5.1)
Sodium: 142 mmol/L (ref 135–145)

## 2017-09-23 LAB — CBC
HCT: 45 % (ref 36.0–46.0)
Hemoglobin: 13.2 g/dL (ref 12.0–15.0)
MCH: 27.7 pg (ref 26.0–34.0)
MCHC: 29.3 g/dL — ABNORMAL LOW (ref 30.0–36.0)
MCV: 94.3 fL (ref 78.0–100.0)
Platelets: 158 10*3/uL (ref 150–400)
RBC: 4.77 MIL/uL (ref 3.87–5.11)
RDW: 14.5 % (ref 11.5–15.5)
WBC: 7.6 10*3/uL (ref 4.0–10.5)

## 2017-09-23 LAB — I-STAT TROPONIN, ED
Troponin i, poc: 0.01 ng/mL (ref 0.00–0.08)
Troponin i, poc: 0.01 ng/mL (ref 0.00–0.08)

## 2017-09-23 LAB — BRAIN NATRIURETIC PEPTIDE: B Natriuretic Peptide: 172.5 pg/mL — ABNORMAL HIGH (ref 0.0–100.0)

## 2017-09-23 MED ORDER — PREDNISONE 10 MG (21) PO TBPK: ORAL_TABLET | Freq: Every day | ORAL | 0 refills | Status: DC

## 2017-09-23 MED ORDER — IPRATROPIUM BROMIDE 0.02 % IN SOLN: 0.5000 mg | Freq: Four times a day (QID) | RESPIRATORY_TRACT | 12 refills | Status: DC

## 2017-09-23 MED ORDER — PREDNISONE 20 MG PO TABS
60.0000 mg | ORAL_TABLET | Freq: Once | ORAL | Status: AC
  Administered 2017-09-23: 60 mg via ORAL
  Filled 2017-09-23: qty 3

## 2017-09-23 MED ORDER — IPRATROPIUM-ALBUTEROL 0.5-2.5 (3) MG/3ML IN SOLN
3.0000 mL | Freq: Once | RESPIRATORY_TRACT | Status: AC
  Administered 2017-09-23: 3 mL via RESPIRATORY_TRACT
  Filled 2017-09-23: qty 3

## 2017-09-23 NOTE — ED Triage Notes (Signed)
Pt presents for evaluation of chest pain with palpitations and shortness of breath. Pt reports hx of CHF. Given 324 ASA and 2 nitro in route. Pain 5/10 on arrival.

## 2017-09-23 NOTE — ED Provider Notes (Signed)
Medical screening examination/treatment/procedure(s) were conducted as a shared visit with non-physician practitioner(s) and myself.  I personally evaluated the patient during the encounter.  EKG Interpretation  Date/Time:  Wednesday September 23 2017 09:52:23 EDT Ventricular Rate:  66 PR Interval:    QRS Duration: 169 QT Interval:  476 QTC Calculation: 499 R Axis:   -66 Text Interpretation:  Sinus rhythm Atrial premature complex RBBB and LAFB Probable left ventricular hypertrophy Confirmed by Fredia Sorrow [phone]) on 09/23/2017 10:28:23 AM Also confirmed by Fredia Sorrow [phone]), editor Philomena Doheny [phone])  on 09/23/2017 10:42:04 AM   Patient seen by me along with physician assistant.  Patient presented with for evaluation of chest pain and palpitations and shortness of breath.  She has a history of CHF.  Extensive work-up here without any acute findings.  No evidence of a cardiac event no evidence of significant CHF on chest x-ray.  Patient uses oxygen at home.  Patient was able to ambulate here fine without any drop in her oxygen saturations or any significant shortness of breath.  Patient very comfortable on her oxygen.  Patient stable for discharge home.   Results for orders placed or performed during the hospital encounter of 30/16/01  Basic metabolic panel  Result Value Ref Range   Sodium 142 135 - 145 mmol/L   Potassium 4.0 3.5 - 5.1 mmol/L   Chloride 99 98 - 111 mmol/L   CO2 34 (H) 22 - 32 mmol/L   Glucose, Bld 133 (H) 70 - 99 mg/dL   BUN 21 8 - 23 mg/dL   Creatinine, Ser 1.41 (H) 0.44 - 1.00 mg/dL   Calcium 9.0 8.9 - 10.3 mg/dL   GFR calc non Af Amer 36 (L) >60 mL/min   GFR calc Af Amer 42 (L) >60 mL/min   Anion gap 9 5 - 15  CBC  Result Value Ref Range   WBC 7.6 4.0 - 10.5 K/uL   RBC 4.77 3.87 - 5.11 MIL/uL   Hemoglobin 13.2 12.0 - 15.0 g/dL   HCT 45.0 36.0 - 46.0 %   MCV 94.3 78.0 - 100.0 fL   MCH 27.7 26.0 - 34.0 pg   MCHC 29.3 (L) 30.0 - 36.0 g/dL   RDW 14.5 11.5 - 15.5 %   Platelets 158 150 - 400 K/uL  Brain natriuretic peptide  Result Value Ref Range   B Natriuretic Peptide 172.5 (H) 0.0 - 100.0 pg/mL  I-stat troponin, ED  Result Value Ref Range   Troponin i, poc 0.01 0.00 - 0.08 ng/mL   Comment 3          I-Stat Troponin, ED (not at [facility])  Result Value Ref Range   Troponin i, poc 0.01 0.00 - 0.08 ng/mL   Comment 3           Dg Chest 2 View  Result Date: 09/23/2017 CLINICAL DATA:  Chest pain, shortness of breath. EXAM: CHEST - 2 VIEW COMPARISON:  Radiographs of August 28, 2017. FINDINGS: Stable cardiomegaly. Status post coronary artery bypass graft. Atherosclerosis of thoracic aorta is noted. Right-sided pacemaker is unchanged in position. No pneumothorax or pleural effusion is noted. No acute pulmonary disease is noted. Bony thorax is unremarkable. IMPRESSION: No active cardiopulmonary disease. Aortic Atherosclerosis (ICD10-I70.0). Electronically Signed   By: Marijo Conception, M.D.   On: 09/23/2017 10:26   Dg Chest 2 View  Result Date: 08/28/2017 CLINICAL DATA:  Chest pain at rest for 3 weeks. Coronary artery disease. Asthma. Diabetes. EXAM: CHEST - 2 VIEW  COMPARISON:  01/30/2017 FINDINGS: Mild cardiomegaly remains stable. Aortic atherosclerosis. Transvenous pacemaker remains in appropriate position. Prior CABG again noted. Both lungs are clear. No evidence of pneumothorax or pleural effusion. IMPRESSION: Stable mild cardiomegaly.  No active lung disease. Electronically Signed   By: Earle Gell M.D.   On: 08/28/2017 15:20   Xr Cervical Spine 2 Or 3 Views  Result Date: 09/08/2017 AP lateral cervical spine x-rays obtained and reviewed.  This shows right chest wall pacemaker.  Anterior plate with fusion at C4-5 solid.  Multilevel spondylosis difficult to view due to body habitus.  Negative for acute fracture. Impression: C4-5 fusion noted.  Xr Lumbar Spine 2-3 Views  Result Date: 09/08/2017 AP lateral lumbar spine x-rays obtained and  reviewed.  This shows significant spondylosis at L2-3 and L4-5 with endplate spurring and narrowing of disc space.  Negative for acute fracture. Impression: Lumbar spondylosis most notable at L2-3 and L4-5 without spondylolisthesis.  Patient BNP slightly elevated in the 100s.  Troponins x2-.   Fredia Sorrow, MD 09/23/17 1606

## 2017-09-23 NOTE — ED Provider Notes (Signed)
[facility] EMERGENCY DEPARTMENT Provider Note   CSN: [id number] Arrival date & time: 09/23/17  6314     History   Chief Complaint Chief Complaint  Patient presents with  . Chest Pain    HPI [name] is a 72 y.o. female with ho obesity, CAD s/p CABG, chronic diastolic HF EF 97%, [employer] pacemaker, HTN, renal insufficiency, diabetes, asthma, OSA here for evaluation of chest tightness, pressure that began at 4 AM this morning.  Associated with shortness of breath described as feeling tighter in her chest and cannot get a good deep breath. Feels like she needs to cough up stuff but nothing comes out.  The chest pain is described as pressure, in the middle and left-sided with some radiation to the shoulder.  The pain has been constant 5/10.  The pain began while she was sitting up at rest.  Nonexertional.  Nonpleuritic.  She took 2 nitroglycerin at about 7:53 AM this morning which did not help.  Also took 4 aspirin.  States last night she can fall asleep.  She has a chronic productive cough that she attributes to asthma and allergies.  Patient typically wears oxygen at home and as needed for shortness of breath on exertion however this past week she has had to use oxygen at all times.  Her weight has been going up and down from 232-247 this past week, her latest weight was 247 yesterday.  Has also noticed intermittent swelling to her legs and abdomen worse at night, better in the morning.  Takes torsemide 20 mg table TID/BID, does not take them on Sunday.  No h/o PE/DVT. On plavix.   HPI  Past Medical History:  Diagnosis Date  . AICD (automatic cardioverter/defibrillator) present   . Anemia   . Arthritis   . Asthma   . CAD (coronary artery disease)    a. 10/09/16 LHC: SVG->LAD patent, SVG->Diag patent, SVG->RCA patent, SVG->LCx occluded. EF 60%, b. 10/31/16 LHC DES to AV groove Circ, DES to intermed branch  . Cellulitis and abscess of foot 12/2014   RT  FOOT  . CHF (congestive heart failure) ([place])   . Chronic bronchitis ([place])   . Chronic lower back pain   . Diverticulosis   . Facial numbness 02/2016  . Fatty liver disease, nonalcoholic   . Gastritis   . GERD (gastroesophageal reflux disease)   . Gout   . H/O hiatal hernia   . High cholesterol   . Hyperplastic colon polyp   . Hypertension   . IBS (irritable bowel syndrome)   . Internal hemorrhoids   . Ischemic cardiomyopathy   . Obesity   . On home oxygen therapy    "2L at night" (10/31/2016)  . OSA (obstructive sleep apnea)    "can't tolerate a mask" (10/30/2016)  . Pneumonia    "couple times" (10/31/2016)  . Shortness of breath   . TIA (transient ischemic attack)    "recently" (10/31/2016)  . Type II diabetes mellitus [facility])     Patient Active Problem List   Diagnosis Date Noted  . Vasomotor rhinitis 10/16/2016  . Right facial numbness 03/05/2016  . CKD (chronic kidney disease), stage III ([place]) 03/05/2016  . Atrial fibrillation ([place]) 10/24/2015  . Chest pain with moderate risk of acute coronary syndrome 10/23/2015  . History of TIA (transient ischemic attack) 10/22/2015  . Complete heart block ([place]) 06/22/2015  . Allergic drug rash due to anti-infective agent 04/29/2015  . Fatigue 02/14/2015  . Chronic  diastolic heart failure ([place]) 02/14/2015  . Cellulitis 12/21/2014  . S/P CABG x 5 11/29/2014  . CAD S/P percutaneous coronary angioplasty   . Diarrhea 07/12/2014  . Generalized abdominal pain 07/12/2014  . Diastolic CHF, acute on chronic (HCC) 11/04/2013  . Mixed hypercholesterolemia and hypertriglyceridemia 04/22/2013  . Vertigo 04/22/2013  . Dyspnea on exertion 08/25/2012  . Allergic to IV contrast 07/23/2012  . Unstable angina ([place]) 07/22/2012  . Morbid (severe) obesity due to excess calories ([place]) 11/22/2011  . Nonischemic cardiomyopathy ([place]) 11/22/2011  . GERD (gastroesophageal reflux disease) 11/22/2011  . Back pain 11/22/2011  . OSA (obstructive sleep  apnea) 11/22/2011  . HEMORRHOIDS-EXTERNAL 02/21/2010  . NAUSEA 02/21/2010  . ABDOMINAL PAIN -GENERALIZED 02/21/2010  . PERSONAL HX COLONIC POLYPS 02/21/2010  . ANEMIA 04/16/2007  . Essential hypertension 04/16/2007  . DIVERTICULOSIS, COLON 04/16/2007  . ARTHRITIS 04/16/2007  . INTERNAL HEMORRHOIDS WITHOUT MENTION COMP 04/12/2007  . Asthma 04/12/2007  . Cardiac resynchronization therapy pacemaker (CRT-P) in place 04/12/2007  . ACUTE GASTRITIS WITHOUT MENTION OF HEMORRHAGE 12/14/2006  . COLONIC POLYPS, HYPERPLASTIC 09/27/2002  . FATTY LIVER DISEASE 02/16/2002    Past Surgical History:  Procedure Laterality Date  . ABDOMINAL ULTRASOUND  12/01/2011   Peripelvic cysts- #1- 2.4x1.9x2.3cm, #2-1.2x0.9x1.2cm  . ANKLE FRACTURE SURGERY Right    "had rod put in"  . ANTERIOR CERVICAL DECOMP/DISCECTOMY FUSION    . APPENDECTOMY    . BACK SURGERY    . BIV ICD INSERTION CRT-D  2001?  [name] BIV PACEMAKER GENERATOR CHANGE OUT N/A 11/02/2012   Procedure: BIV PACEMAKER GENERATOR CHANGE OUT;  Surgeon: Sanda Klein, MD;  Location: [facility] CATH LAB;  Service: Cardiovascular;  Laterality: N/A;  . CARDIAC CATHETERIZATION  05/17/1999   No significant coronary obstructive disease w/ mild 20% luminal irregularity of the first diag branch of the LAD  . CARDIAC CATHETERIZATION  07/08/2002   No significant CAD, moderately depressed LV systolic function  . CARDIAC CATHETERIZATION Bilateral 04/26/2007   Normal findings, recommend medical therapy  . CARDIAC CATHETERIZATION  02/18/2008   Moderate CAD, would benefit from having a functional study, recommend continue medical therapy  . CARDIAC CATHETERIZATION  07/23/2012   Medical therapy  . CARDIAC CATHETERIZATION N/A 11/24/2014   Procedure: Left Heart Cath and Coronary Angiography;  Surgeon: Troy Sine, MD;  Location: [facility] CV LAB;  Service: Cardiovascular;  Laterality: N/A;  . CARDIAC CATHETERIZATION  11/27/2014   Procedure: Intravascular Pressure Wire/FFR Study;   Surgeon: Peter M Martinique, MD;  Location: [facility] CV LAB;  Service: Cardiovascular;;  . CARDIAC CATHETERIZATION  10/09/2016  . CORONARY ANGIOGRAM  2010  . CORONARY ARTERY BYPASS GRAFT N/A 11/29/2014   Procedure: CORONARY ARTERY BYPASS GRAFTING x 5 (LIMA-LAD, SVG-D, SVG-OM1-OM2, SVG-PD);  Surgeon: Melrose Nakayama, MD;  Location: [facility];  Service: Open Heart Surgery;  Laterality: N/A;  . CORONARY STENT INTERVENTION N/A 10/31/2016   Procedure: CORONARY STENT INTERVENTION;  Surgeon: Burnell Blanks, MD;  Location: [facility] CV LAB;  Service: Cardiovascular;  Laterality: N/A;  . FRACTURE SURGERY    . INSERT / REPLACE / [place]  . KNEE ARTHROSCOPY Bilateral   . LEFT HEART CATHETERIZATION WITH CORONARY ANGIOGRAM N/A 07/23/2012   Procedure: LEFT HEART CATHETERIZATION WITH CORONARY ANGIOGRAM;  Surgeon: Leonie Man, MD;  Location[facility] C[facility] LAB;  Service: Cardiovascular;  Laterality: N/A;  Carlton Adam MYOVIEW  11/14/2011   Mild-moderate defect seen in Mid Inferolateral and Mid Anterolateral regions-consistant w/ infarct/scar. No significant ischemia demonstrated.  [name]  RIGHT/LEFT HEART CATH AND CORONARY ANGIOGRAPHY N/A 10/09/2016   Procedure: RIGHT/LEFT HEART CATH AND CORONARY ANGIOGRAPHY;  Surgeon: Jolaine Artist, MD;  Location: [facility] CV LAB;  Service: Cardiovascular;  Laterality: N/A;  . TEE WITHOUT CARDIOVERSION N/A 11/29/2014   Procedure: TRANSESOPHAGEAL ECHOCARDIOGRAM (TEE);  Surgeon: Melrose Nakayama, MD;  Location: [facility];  Service: Open Heart Surgery;  Laterality: N/A;  . TRANSTHORACIC ECHOCARDIOGRAM  07/23/2012   EF 55-60%, normal-mild  . TUBAL LIGATION       OB History   None      Home Medications    Prior to Admission medications   Medication Sig Start Date End Date Taking? Authorizing Provider  acetaminophen (TYLENOL) 500 MG tablet Take 1,000 mg by mouth every 6 (six) hours as needed for mild pain.    Yes [provider]  albuterol  (PROVENTIL) (2.5 MG/3ML) 0.083% nebulizer solution Take 2.5 mg by nebulization every 6 (six) hours as needed for wheezing or shortness of breath.   Yes [provider]  aspirin EC 81 MG tablet Take 1 tablet (81 mg total) by mouth daily. 03/27/16  Yes Kilroy, Luke K, PA-C  BIOTIN PO Take 1 tablet by mouth daily.   Yes [provider]  bisoprolol (ZEBETA) 5 MG tablet TAKE 1 TABLET EVERY DAY Patient taking differently: Take 5 mg by mouth daily.  09/08/17  Yes Kilroy, Luke K, PA-C  budesonide-formoterol (SYMBICORT) 160-4.5 MCG/ACT inhaler Inhale 2 puffs into the lungs 2 (two) times daily.   Yes [provider]  cetirizine (ZYRTEC) 10 MG tablet Take 10 mg by mouth daily.  03/24/17  Yes [provider]  clopidogrel (PLAVIX) 75 MG tablet TAKE 1 TABLET EVERY DAY Patient taking differently: Take 75 mg by mouth daily.  09/08/17  Yes Croitoru, [name], MD  clotrimazole (LOTRIMIN) 1 % cream Apply 1 application topically 2 (two) times daily. Patient taking differently: Apply 1 application topically 2 (two) times daily as needed (rash).  07/20/17  Yes Lendon Colonel, NP  docusate sodium (COLACE) 100 MG capsule Take 200 mg by mouth daily as needed for mild constipation.    Yes [provider]  EPINEPHrine (EPIPEN 2-PAK) 0.3 mg/0.3 mL IJ SOAJ injection Inject 0.3 mg into the muscle once as needed (severe allergic reaction).   Yes [provider]  ezetimibe (ZETIA) 10 MG tablet Take 1 tablet (10 mg total) by mouth daily. 07/20/17 10/18/17 Yes Lendon Colonel, NP  fluticasone [facility]) 50 MCG/ACT nasal spray Place 1 spray into both nostrils at bedtime as needed for rhinitis or allergies.    Yes [provider]  guaiFENesin (MUCINEX) 600 MG 12 hr tablet Take 600 mg by mouth at bedtime.    Yes [provider]  hydroxypropyl methylcellulose / hypromellose (ISOPTO TEARS / GONIOVISC) 2.5 % ophthalmic solution Place 1 drop into both eyes 4 (four) times  daily as needed for dry eyes.    Yes [provider]  ipratropium (ATROVENT) 0.06 % nasal spray 1-2 pffs up to 4 x daily as needed Patient taking differently: Place 2 sprays into both nostrils 4 (four) times daily as needed for rhinitis.  10/16/16  Yes Tanda Rockers, MD  isosorbide mononitrate (IMDUR) 60 MG 24 hr tablet Take 1 tablet (60 mg total) by mouth daily. 06/08/17  Yes Croitoru, [name], MD  metolazone (ZAROXOLYN) 5 MG tablet Take 1 tablet (5 mg total) by mouth as needed. Take only as directed by [facility] Patient taking differently: Take 5  mg by mouth daily.  07/21/16  Yes Eileen Stanford, PA-C  nitroGLYCERIN (NITROSTAT) 0.4 MG SL tablet Place 1 tablet (0.4 mg total) under the tongue every 5 (five) minutes as needed for chest pain. 11/13/16 09/23/17 Yes Kilroy, Luke K, PA-C  OXYGEN Inhale 2 L into the lungs See admin instructions. as needed for low oxygen levels & at bedtime each night.   Yes [provider]  pantoprazole (PROTONIX) 40 MG tablet Take 1 tablet (40 mg total) by mouth daily. 12/10/16 12/10/17 Yes Croitoru, [name], MD  potassium chloride SA (K-DUR,KLOR-CON) 20 MEQ tablet TAKE 1 TABLET EVERY DAY Patient taking differently: Take 20 mEq by mouth daily.  09/08/17  Yes Croitoru, [name], MD  rosuvastatin (CRESTOR) 20 MG tablet TAKE 1 TABLET EVERY DAY Patient taking differently: Take 20 mg by mouth at bedtime.  11/26/16  Yes Croitoru, [name], MD  spironolactone (ALDACTONE) 25 MG tablet TAKE 1 TABLET EVERY DAY Patient taking differently: Take 25 mg by mouth at bedtime.  11/26/16  Yes Croitoru, [name], MD  torsemide (DEMADEX) 20 MG tablet TAKE 40 MG (2TAB) EVERY OTHER DAY ALTERNATING WITH 60 MG EVERY OTHER DAY Patient taking differently: Take 40-60 mg by mouth See admin instructions. TAKE 40 MG (2TAB) EVERY OTHER DAY ALTERNATING WITH 60 MG (2 tabs in am, 2 tabs in pm) EVERY OTHER DAY 08/18/17  Yes Bensimhon, Shaune Pascal, MD  triamcinolone cream (KENALOG) 0.1 % Apply 1  application topically 2 (two) times daily as needed (itching).  08/24/14  Yes [provider]  VENTOLIN HFA 108 (90 Base) MCG/ACT inhaler INHALE 2 PUFFS EVERY 4 HOURS AS NEEDED FOR WHEEZING Patient taking differently: Inhale 2 puffs into the lungs every 4 (four) hours as needed for wheezing or shortness of breath.  07/10/16  Yes Rigoberto Noel, MD  colchicine 0.6 MG tablet Take one tablet two times daily as needed. Patient not taking: Reported on 09/23/2017 09/30/16   Marybelle Killings, MD  fluconazole (DIFLUCAN) 100 MG tablet Take 1 tablet (100 mg total) by mouth as directed. Take 2 today and then one daily for 6 days. Patient not taking: Reported on 09/23/2017 07/21/17   Willia Craze, NP  ipratropium (ATROVENT) 0.02 % nebulizer solution Take 2.5 mLs (0.5 mg total) by nebulization 4 (four) times daily. 09/23/17   Kinnie Feil, PA-C  ondansetron (ZOFRAN ODT) 4 MG disintegrating tablet Take 1 tablet (4 mg total) by mouth every 8 (eight) hours as needed. Patient not taking: Reported on 09/23/2017 04/18/17   Isla Pence, MD  predniSONE (STERAPRED UNI-PAK 21 TAB) 10 MG (21) TBPK tablet Take by mouth daily. Take 6 tabs by mouth daily  for 2 days, then 5 tabs for 2 days, then 4 tabs for 2 days, then 3 tabs for 2 days, 2 tabs for 2 days, then 1 tab by mouth daily for 2 days 09/23/17   Kinnie Feil, PA-C    Family History Family History  Problem Relation Age of Onset  . Breast cancer Mother   . Diabetes Mother   . Heart disease Maternal Grandmother   . Kidney disease Maternal Grandmother   . Diabetes Maternal Grandmother   . Glaucoma Maternal Aunt   . Heart disease Maternal Aunt   . Asthma Sister     Social History Social History   Tobacco Use  . Smoking status: Former Smoker    Packs/day: 0.25    Years: 3.00    Pack years: 0.75    Types: Cigarettes  Last attempt to quit: 02/11/1968    Years since quitting: 49.6  . Smokeless tobacco: Never Used  Substance Use Topics  .  Alcohol use: No  . Drug use: No     Allergies   Ivp dye [iodinated diagnostic agents]; Shellfish-derived products; Sulfa antibiotics; Iodine; Atorvastatin; Doxycycline; Cephalexin; and Zithromax [azithromycin]   Review of Systems Review of Systems  Respiratory: Positive for cough (cough) and shortness of breath.   Cardiovascular: Positive for chest pain and leg swelling.  Gastrointestinal: Positive for nausea.       Abdominal swelling  Hematological: Bruises/bleeds easily.  All other systems reviewed and are negative.    Physical Exam Updated Vital Signs BP (!) 141/61   Pulse 71   Resp 20   Wt 116.8 kg   SpO2 98%   BMI 47.08 kg/m   Physical Exam  Constitutional: She is oriented to person, place, and time. She appears well-developed and well-nourished.  Morbidly obese. Non toxic.   HENT:  Head: Normocephalic and atraumatic.  Nose: Nose normal.  Dry MM  Eyes: Pupils are equal, round, and reactive to light. Conjunctivae and EOM are normal.  Neck: Normal range of motion.  Cardiovascular: Normal rate, regular rhythm and intact distal pulses.  2+ pitting edema up to knees bilaterally, slightly worse on L>R (chronic per patient). 2+ DP and radial pulses bilaterally. No calf tenderness.   Pulmonary/Chest: Effort normal. She has wheezes in the right middle field, the right lower field, the left middle field and the left lower field.  No chest wall tenderness. No pain in chest with deep inspiration.   Abdominal: Soft. Bowel sounds are normal. There is no tenderness.  No G/R/R. No suprapubic or CVA tenderness. Negative Murphy's and McBurney's.   Musculoskeletal: Normal range of motion.  Neurological: She is alert and oriented to person, place, and time.  Skin: Skin is warm and dry. Capillary refill takes less than 2 seconds.  Psychiatric: She has a normal mood and affect. Her behavior is normal.  Nursing note and vitals reviewed.    ED Treatments / Results  Labs (all labs  ordered are listed, but only abnormal results are displayed) Labs Reviewed  BASIC METABOLIC PANEL - Abnormal; Notable for the following components:      Result Value   CO2 34 (*)    Glucose, Bld 133 (*)    Creatinine, Ser 1.41 (*)    GFR calc non Af Amer 36 (*)    GFR calc Af Amer 42 (*)    All other components within normal limits  CBC - Abnormal; Notable for the following components:   MCHC 29.3 (*)    All other components within normal limits  BRAIN NATRIURETIC PEPTIDE - Abnormal; Notable for the following components:   B Natriuretic Peptide 172.5 (*)    All other components within normal limits  I-STAT TROPONIN, ED  I-STAT TROPONIN, ED    EKG EKG Interpretation  Date/Time:  Wednesday September 23 2017 09:52:23 EDT Ventricular Rate:  66 PR Interval:    QRS Duration: 169 QT Interval:  476 QTC Calculation: 499 R Axis:   -66 Text Interpretation:  Sinus rhythm Atrial premature complex RBBB and LAFB Probable left ventricular hypertrophy Confirmed by Fredia Sorrow [phone]) on 09/23/2017 10:28:23 AM Also confirmed by Fredia Sorrow [phone]), editor Philomena Doheny [phone])  on 09/23/2017 10:42:04 AM   Radiology Dg Chest 2 View  Result Date: 09/23/2017 CLINICAL DATA:  Chest pain, shortness of breath. EXAM: CHEST - 2 VIEW COMPARISON:  Radiographs of August 28, 2017. FINDINGS: Stable cardiomegaly. Status post coronary artery bypass graft. Atherosclerosis of thoracic aorta is noted. Right-sided pacemaker is unchanged in position. No pneumothorax or pleural effusion is noted. No acute pulmonary disease is noted. Bony thorax is unremarkable. IMPRESSION: No active cardiopulmonary disease. Aortic Atherosclerosis (ICD10-I70.0). Electronically Signed   By: Marijo Conception, M.D.   On: 09/23/2017 10:26    Procedures Procedures (including critical care time)  Medications Ordered in ED Medications  ipratropium-albuterol (DUONEB) 0.5-2.5 (3) MG/3ML nebulizer solution 3 mL (3 mLs Nebulization Given  09/23/17 1125)  ipratropium-albuterol (DUONEB) 0.5-2.5 (3) MG/3ML nebulizer solution 3 mL (3 mLs Nebulization Given 09/23/17 1434)  predniSONE (DELTASONE) tablet 60 mg (60 mg Oral Given 09/23/17 1433)     Initial Impression / Assessment and Plan / ED Course  I have reviewed the triage vital signs and the nursing notes.  Pertinent labs & imaging results that were available during my care of the patient were reviewed by me and considered in my medical decision making (see chart for details).  Clinical Course as of Sep 24 1606  Wed Sep 23, 2017  1054 Creatinine(!): 1.41 [CG]  1055 GFR, Est Non African American(!): 36 [CG]    Clinical Course User Index [CG] Kinnie Feil, PA-C   Considering ACS vs HF exacerbation vs asthma exacerbation vs bronchitis.  Less likely PE she is on plavix and has no pleuritic component to it, other risk factors, asymmetric LE edema or calf tenderness. Doubt dissection.   Work up remarkable for creatinine 1.41/GFR 36 at baseline. BNP 172 at baseline. CXR, EKG, trop x 2 WNL.    Patient is obese, deconditioned, at baseline typically walks 20 ft without oxygen or significant SOB.  She was able to walk more than 20 ft today without supplemental oxygen, SOB or desaturation in the ER.  Chest tightness, SOB improved with duonebs.  Felt like she could cough more after breathing tx. Suspect bronchitis vs asthma exacerbation.  At this time no indication for abx, no fever, constitutional symptoms.  Will dc with prednisone, ipratropium/albuterol inhalers q6 and re-check with PCP in 2 days for persistent symptoms. Return precautions given.  Final Clinical Impressions(s) / ED Diagnoses   Final diagnoses:  Chest tightness  Productive cough  Bronchitis    ED Discharge Orders         Ordered    predniSONE (STERAPRED UNI-PAK 21 TAB) 10 MG (21) TBPK tablet  Daily     09/23/17 1600    ipratropium (ATROVENT) 0.02 % nebulizer solution  4 times daily     09/23/17 1600             Kinnie Feil, [place] 09/23/17 1608    Fredia Sorrow, MD 09/25/17 [phone]

## 2017-09-23 NOTE — ED Notes (Signed)
Pt ambulated in hallway with no complaints of dizziness or pain. Patient room air O2 sat 89-94%. Patient does have home O2 she uses PRN.

## 2017-09-23 NOTE — Discharge Instructions (Signed)
You were seen in the emergency department for chest tightness, shortness of breath, productive cough.  Your heart enzymes, chest x-ray, EKG and remaining lab work today was reassuring.  I think your symptoms are from asthma flare or bronchitis.  You were able to walk today with normal oxygen levels.  We will treat your symptoms with a short course of prednisone and scheduled breathing treatments with albuterol and ipratropium nebulizing solutions.  You may need to use her oxygen more frequently until you are chest tightness, congestion clears up.  Return to the ER for worsening shortness of breath, more productive cough, fevers, chills, pain in your chest with exertion or activity.

## 2017-09-25 ENCOUNTER — Telehealth: Payer: Self-pay | Admitting: Internal Medicine

## 2017-09-25 DIAGNOSIS — J4530 Mild persistent asthma, uncomplicated: Secondary | ICD-10-CM

## 2017-09-25 NOTE — Telephone Encounter (Signed)
ok 

## 2017-09-25 NOTE — Telephone Encounter (Signed)
Order placed. I called pt to make her aware. Pt understood. Nothing further is needed.

## 2017-09-25 NOTE — Telephone Encounter (Signed)
Patient called requesting a new nebulizer machine, because the one she has recently broke. Her DME is [facility].    Dr. Melvyn Novas, please advise

## 2017-09-29 ENCOUNTER — Ambulatory Visit (HOSPITAL_COMMUNITY)
Admission: RE | Admit: 2017-09-29 | Discharge: 2017-09-29 | Disposition: A | Discharge status: Home / Self Care | Payer: Medicare HMO | Source: Ambulatory Visit | Attending: Orthopedic Surgery | Admitting: Orthopedic Surgery

## 2017-09-29 ENCOUNTER — Other Ambulatory Visit (HOSPITAL_COMMUNITY): Payer: Self-pay | Admitting: Orthopedic Surgery

## 2017-09-29 ENCOUNTER — Ambulatory Visit (INDEPENDENT_AMBULATORY_CARE_PROVIDER_SITE_OTHER): Payer: Medicare HMO | Admitting: Orthopaedic Surgery

## 2017-09-29 DIAGNOSIS — M79662 Pain in left lower leg: Secondary | ICD-10-CM | POA: Insufficient documentation

## 2017-09-29 DIAGNOSIS — M7989 Other specified soft tissue disorders: Secondary | ICD-10-CM | POA: Diagnosis not present

## 2017-09-29 DIAGNOSIS — M79605 Pain in left leg: Secondary | ICD-10-CM

## 2017-09-29 DIAGNOSIS — J9611 Chronic respiratory failure with hypoxia: Secondary | ICD-10-CM | POA: Diagnosis not present

## 2017-09-29 DIAGNOSIS — I10 Essential (primary) hypertension: Secondary | ICD-10-CM | POA: Diagnosis not present

## 2017-09-29 DIAGNOSIS — S92515A Nondisplaced fracture of proximal phalanx of left lesser toe(s), initial encounter for closed fracture: Secondary | ICD-10-CM | POA: Diagnosis not present

## 2017-09-29 DIAGNOSIS — I5032 Chronic diastolic (congestive) heart failure: Secondary | ICD-10-CM | POA: Diagnosis not present

## 2017-09-29 DIAGNOSIS — J4520 Mild intermittent asthma, uncomplicated: Secondary | ICD-10-CM | POA: Diagnosis not present

## 2017-09-29 DIAGNOSIS — R269 Unspecified abnormalities of gait and mobility: Secondary | ICD-10-CM | POA: Diagnosis not present

## 2017-09-29 DIAGNOSIS — J4530 Mild persistent asthma, uncomplicated: Secondary | ICD-10-CM | POA: Diagnosis not present

## 2017-09-29 DIAGNOSIS — R0602 Shortness of breath: Secondary | ICD-10-CM | POA: Diagnosis not present

## 2017-09-29 NOTE — Progress Notes (Signed)
*  Preliminary Results* Left lower extremity venous duplex completed. Left lower extremity is negative for deep vein thrombosis. There is no evidence of left Baker's cyst.  09/29/2017 11:30 AM  [name]

## 2017-10-02 DIAGNOSIS — E1169 Type 2 diabetes mellitus with other specified complication: Secondary | ICD-10-CM | POA: Diagnosis not present

## 2017-10-02 DIAGNOSIS — J4520 Mild intermittent asthma, uncomplicated: Secondary | ICD-10-CM | POA: Diagnosis not present

## 2017-10-02 DIAGNOSIS — E1122 Type 2 diabetes mellitus with diabetic chronic kidney disease: Secondary | ICD-10-CM | POA: Diagnosis not present

## 2017-10-02 DIAGNOSIS — N183 Chronic kidney disease, stage 3 (moderate): Secondary | ICD-10-CM | POA: Diagnosis not present

## 2017-10-02 DIAGNOSIS — I5033 Acute on chronic diastolic (congestive) heart failure: Secondary | ICD-10-CM | POA: Diagnosis not present

## 2017-10-02 DIAGNOSIS — I10 Essential (primary) hypertension: Secondary | ICD-10-CM | POA: Diagnosis not present

## 2017-10-02 DIAGNOSIS — I5032 Chronic diastolic (congestive) heart failure: Secondary | ICD-10-CM | POA: Diagnosis not present

## 2017-10-02 DIAGNOSIS — I2510 Atherosclerotic heart disease of native coronary artery without angina pectoris: Secondary | ICD-10-CM | POA: Diagnosis not present

## 2017-10-05 DIAGNOSIS — J9611 Chronic respiratory failure with hypoxia: Secondary | ICD-10-CM | POA: Diagnosis not present

## 2017-10-05 DIAGNOSIS — J4520 Mild intermittent asthma, uncomplicated: Secondary | ICD-10-CM | POA: Diagnosis not present

## 2017-10-05 DIAGNOSIS — I10 Essential (primary) hypertension: Secondary | ICD-10-CM | POA: Diagnosis not present

## 2017-10-05 DIAGNOSIS — I5032 Chronic diastolic (congestive) heart failure: Secondary | ICD-10-CM | POA: Diagnosis not present

## 2017-10-05 DIAGNOSIS — R0602 Shortness of breath: Secondary | ICD-10-CM | POA: Diagnosis not present

## 2017-10-05 DIAGNOSIS — R269 Unspecified abnormalities of gait and mobility: Secondary | ICD-10-CM | POA: Diagnosis not present

## 2017-10-26 IMAGING — CR DG CHEST 2V
2 series · 2 of 2 positions shown · non-contrast
Comparison: 09/18/2016

CLINICAL DATA: Chest pain, shortness of Breath

EXAM:
CHEST  2 VIEW

[chest pa]
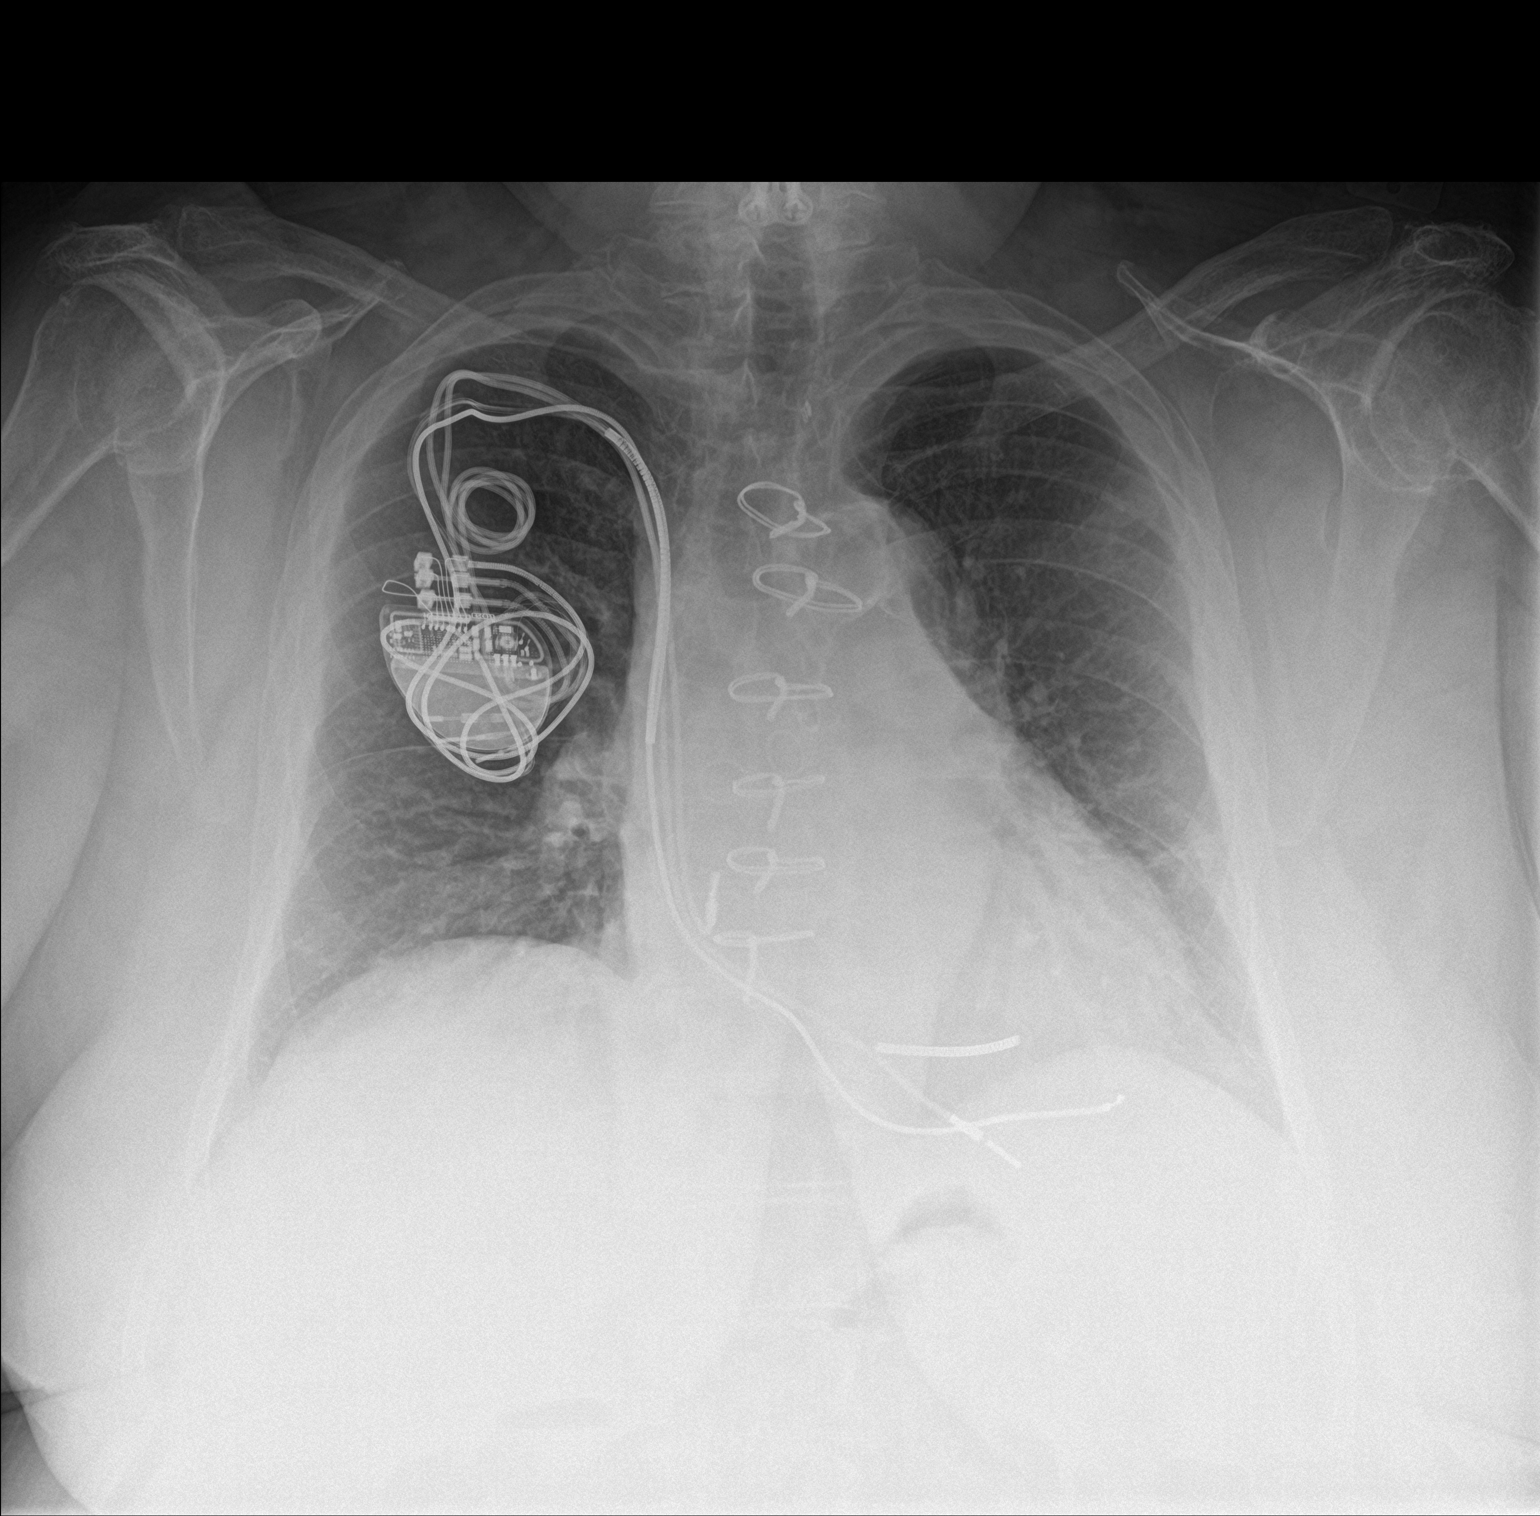

[chest lat]
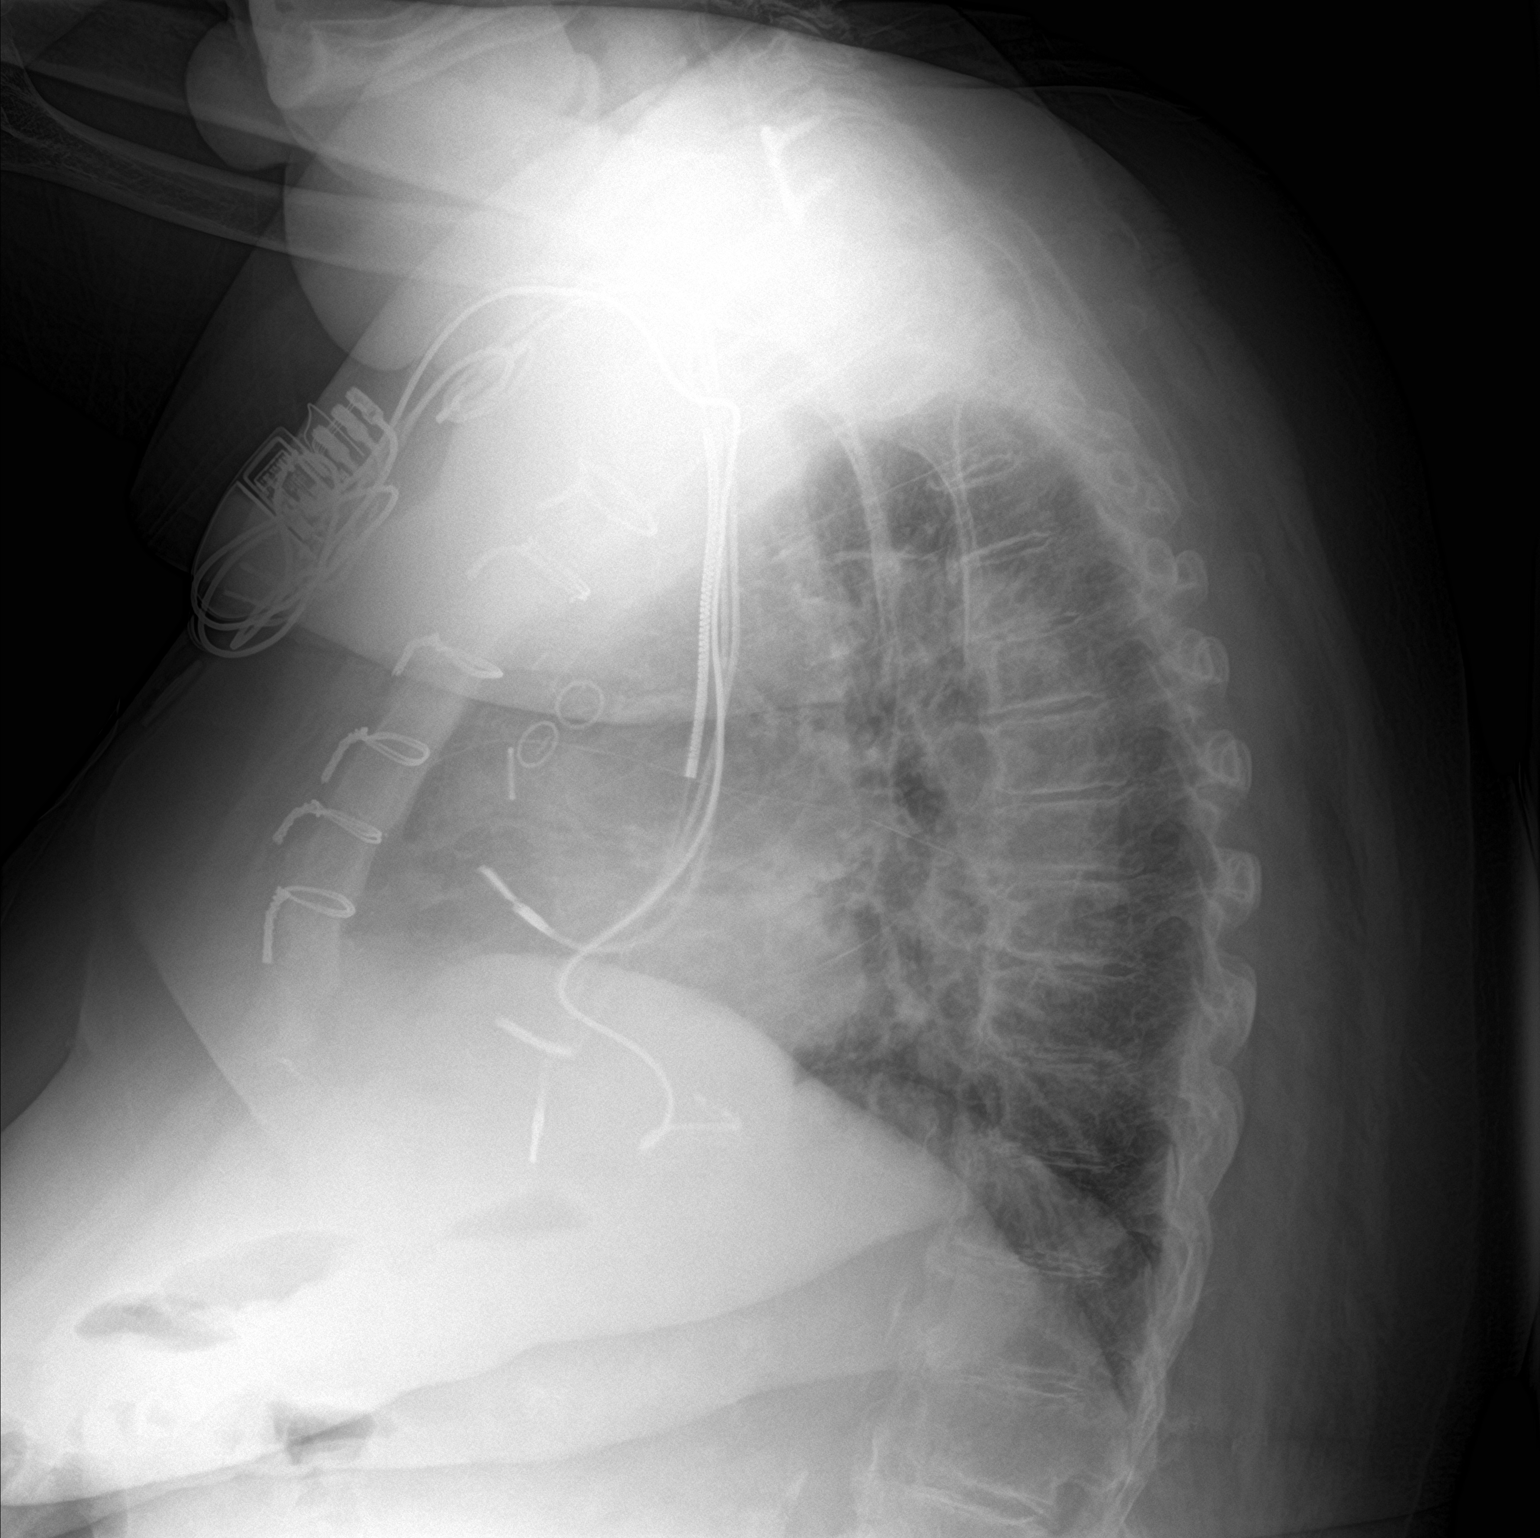

[2 of 2 positions shown; findings below may reference images not displayed]

FINDINGS: Right AICD remains in place, unchanged. Prior CABG. Heart is
borderline in size. No confluent opacities, effusions or edema.
IMPRESSION: No active disease.

## 2017-10-29 DIAGNOSIS — Z6841 Body Mass Index (BMI) 40.0 and over, adult: Secondary | ICD-10-CM | POA: Diagnosis not present

## 2017-10-29 DIAGNOSIS — R5383 Other fatigue: Secondary | ICD-10-CM | POA: Diagnosis not present

## 2017-10-29 DIAGNOSIS — E1122 Type 2 diabetes mellitus with diabetic chronic kidney disease: Secondary | ICD-10-CM | POA: Diagnosis not present

## 2017-10-29 DIAGNOSIS — E7800 Pure hypercholesterolemia, unspecified: Secondary | ICD-10-CM | POA: Diagnosis not present

## 2017-10-29 DIAGNOSIS — I5032 Chronic diastolic (congestive) heart failure: Secondary | ICD-10-CM | POA: Diagnosis not present

## 2017-10-29 DIAGNOSIS — I10 Essential (primary) hypertension: Secondary | ICD-10-CM | POA: Diagnosis not present

## 2017-10-30 DIAGNOSIS — J4520 Mild intermittent asthma, uncomplicated: Secondary | ICD-10-CM | POA: Diagnosis not present

## 2017-10-30 DIAGNOSIS — R0602 Shortness of breath: Secondary | ICD-10-CM | POA: Diagnosis not present

## 2017-10-30 DIAGNOSIS — I5032 Chronic diastolic (congestive) heart failure: Secondary | ICD-10-CM | POA: Diagnosis not present

## 2017-10-30 DIAGNOSIS — J9611 Chronic respiratory failure with hypoxia: Secondary | ICD-10-CM | POA: Diagnosis not present

## 2017-10-30 DIAGNOSIS — J4530 Mild persistent asthma, uncomplicated: Secondary | ICD-10-CM | POA: Diagnosis not present

## 2017-10-30 DIAGNOSIS — I10 Essential (primary) hypertension: Secondary | ICD-10-CM | POA: Diagnosis not present

## 2017-10-30 DIAGNOSIS — R269 Unspecified abnormalities of gait and mobility: Secondary | ICD-10-CM | POA: Diagnosis not present

## 2017-11-05 DIAGNOSIS — R269 Unspecified abnormalities of gait and mobility: Secondary | ICD-10-CM | POA: Diagnosis not present

## 2017-11-05 DIAGNOSIS — R0602 Shortness of breath: Secondary | ICD-10-CM | POA: Diagnosis not present

## 2017-11-05 DIAGNOSIS — I5032 Chronic diastolic (congestive) heart failure: Secondary | ICD-10-CM | POA: Diagnosis not present

## 2017-11-05 DIAGNOSIS — J9611 Chronic respiratory failure with hypoxia: Secondary | ICD-10-CM | POA: Diagnosis not present

## 2017-11-05 DIAGNOSIS — I10 Essential (primary) hypertension: Secondary | ICD-10-CM | POA: Diagnosis not present

## 2017-11-05 DIAGNOSIS — J4520 Mild intermittent asthma, uncomplicated: Secondary | ICD-10-CM | POA: Diagnosis not present

## 2017-11-09 DIAGNOSIS — I5033 Acute on chronic diastolic (congestive) heart failure: Secondary | ICD-10-CM | POA: Diagnosis not present

## 2017-11-09 DIAGNOSIS — J4520 Mild intermittent asthma, uncomplicated: Secondary | ICD-10-CM | POA: Diagnosis not present

## 2017-11-09 DIAGNOSIS — I2510 Atherosclerotic heart disease of native coronary artery without angina pectoris: Secondary | ICD-10-CM | POA: Diagnosis not present

## 2017-11-09 DIAGNOSIS — I10 Essential (primary) hypertension: Secondary | ICD-10-CM | POA: Diagnosis not present

## 2017-11-09 DIAGNOSIS — N183 Chronic kidney disease, stage 3 (moderate): Secondary | ICD-10-CM | POA: Diagnosis not present

## 2017-11-09 DIAGNOSIS — E1122 Type 2 diabetes mellitus with diabetic chronic kidney disease: Secondary | ICD-10-CM | POA: Diagnosis not present

## 2017-11-10 ENCOUNTER — Other Ambulatory Visit: Payer: Self-pay | Admitting: Cardiology

## 2017-11-12 ENCOUNTER — Encounter (HOSPITAL_COMMUNITY): Payer: Self-pay | Admitting: *Deleted

## 2017-11-12 DIAGNOSIS — K529 Noninfective gastroenteritis and colitis, unspecified: Secondary | ICD-10-CM | POA: Diagnosis not present

## 2017-11-12 DIAGNOSIS — R1084 Generalized abdominal pain: Secondary | ICD-10-CM | POA: Insufficient documentation

## 2017-11-12 DIAGNOSIS — R197 Diarrhea, unspecified: Secondary | ICD-10-CM | POA: Diagnosis not present

## 2017-11-12 DIAGNOSIS — K8020 Calculus of gallbladder without cholecystitis without obstruction: Secondary | ICD-10-CM | POA: Diagnosis not present

## 2017-11-12 LAB — COMPREHENSIVE METABOLIC PANEL
ALT: 20 U/L (ref 0–44)
AST: 25 U/L (ref 15–41)
Albumin: 3.6 g/dL (ref 3.5–5.0)
Alkaline Phosphatase: 84 U/L (ref 38–126)
Anion gap: 11 (ref 5–15)
BUN: 30 mg/dL — ABNORMAL HIGH (ref 8–23)
CO2: 31 mmol/L (ref 22–32)
Calcium: 9.9 mg/dL (ref 8.9–10.3)
Chloride: 102 mmol/L (ref 98–111)
Creatinine, Ser: 1.25 mg/dL — ABNORMAL HIGH (ref 0.44–1.00)
GFR calc Af Amer: 48 mL/min — ABNORMAL LOW (ref >60)
GFR calc non Af Amer: 42 mL/min — ABNORMAL LOW (ref >60)
Glucose, Bld: 143 mg/dL — ABNORMAL HIGH (ref 70–99)
Potassium: 4.3 mmol/L (ref 3.5–5.1)
Sodium: 144 mmol/L (ref 135–145)
Total Bilirubin: 0.6 mg/dL (ref 0.3–1.2)
Total Protein: 7.1 g/dL (ref 6.5–8.1)

## 2017-11-12 LAB — CBC
HCT: 43.1 % (ref 36.0–46.0)
Hemoglobin: 12.9 g/dL (ref 12.0–15.0)
MCH: 28.1 pg (ref 26.0–34.0)
MCHC: 29.9 g/dL — ABNORMAL LOW (ref 30.0–36.0)
MCV: 93.9 fL (ref 78.0–100.0)
Platelets: 176 10*3/uL (ref 150–400)
RBC: 4.59 MIL/uL (ref 3.87–5.11)
RDW: 14.8 % (ref 11.5–15.5)
WBC: 8.1 10*3/uL (ref 4.0–10.5)

## 2017-11-12 LAB — LIPASE, BLOOD: Lipase: 46 U/L (ref 11–51)

## 2017-11-12 NOTE — ED Triage Notes (Signed)
Pt reports onset of generalized abdominal pain and diarrhea on Tuesday. Last took imodium for diarrhea this morning.

## 2017-11-13 ENCOUNTER — Emergency Department (HOSPITAL_COMMUNITY)
Admission: EM | Admit: 2017-11-13 | Discharge: 2017-11-13 | Disposition: A | Discharge status: Home / Self Care | Payer: Medicare HMO | Attending: Emergency Medicine | Admitting: Emergency Medicine

## 2017-11-13 ENCOUNTER — Emergency Department (HOSPITAL_COMMUNITY): Payer: Medicare HMO

## 2017-11-13 DIAGNOSIS — K529 Noninfective gastroenteritis and colitis, unspecified: Secondary | ICD-10-CM

## 2017-11-13 DIAGNOSIS — R1084 Generalized abdominal pain: Secondary | ICD-10-CM

## 2017-11-13 DIAGNOSIS — K8020 Calculus of gallbladder without cholecystitis without obstruction: Secondary | ICD-10-CM | POA: Diagnosis not present

## 2017-11-13 LAB — URINALYSIS, ROUTINE W REFLEX MICROSCOPIC
Bilirubin Urine: NEGATIVE
Glucose, UA: NEGATIVE mg/dL
Hgb urine dipstick: NEGATIVE
Ketones, ur: NEGATIVE mg/dL
Leukocytes, UA: NEGATIVE
Nitrite: NEGATIVE
Protein, ur: NEGATIVE mg/dL
Specific Gravity, Urine: 1.02 (ref 1.005–1.030)
pH: 5 (ref 5.0–8.0)

## 2017-11-13 MED ORDER — ONDANSETRON HCL 4 MG/2ML IJ SOLN
4.0000 mg | Freq: Once | INTRAMUSCULAR | Status: AC
  Administered 2017-11-13: 4 mg via INTRAVENOUS
  Filled 2017-11-13: qty 2

## 2017-11-13 MED ORDER — SODIUM CHLORIDE 0.9 % IV BOLUS
1000.0000 mL | Freq: Once | INTRAVENOUS | Status: AC
  Administered 2017-11-13: 1000 mL via INTRAVENOUS

## 2017-11-13 MED ORDER — KETOROLAC TROMETHAMINE 30 MG/ML IJ SOLN
30.0000 mg | Freq: Once | INTRAMUSCULAR | Status: AC
  Administered 2017-11-13: 30 mg via INTRAVENOUS
  Filled 2017-11-13: qty 1

## 2017-11-13 NOTE — ED Provider Notes (Signed)
[facility] DEPT Provider Note   CSN: [id number] Arrival date & time: 11/12/17  1907     History   Chief Complaint Chief Complaint  Patient presents with  . Abdominal Pain    HPI [name] is a 72 y.o. female.  Patient is a 72 year old female with past medical history of coronary artery disease with CABG, cardiomyopathy with AICD, obesity, and multiple other medical issues.  She presents for evaluation of abdominal pain, diarrhea for the past 5 days.  This started during a trip out of town and has persisted.  She denies any bloody stool.  She denies any vomiting.  She does report a fever yesterday evening of 101.  She denies any urinary complaints.  The history is provided by the patient.  Abdominal Pain   This is a new problem. Episode onset: 5 days ago. The problem occurs constantly. The problem has been gradually worsening. The pain is associated with an unknown factor. The pain is located in the generalized abdominal region. The quality of the pain is cramping. The pain is moderate. Associated symptoms include fever and diarrhea. Pertinent negatives include hematochezia, melena, dysuria and frequency. Nothing aggravates the symptoms. Nothing relieves the symptoms.    Past Medical History:  Diagnosis Date  . AICD (automatic cardioverter/defibrillator) present   . Anemia   . Arthritis   . Asthma   . CAD (coronary artery disease)    a. 10/09/16 LHC: SVG->LAD patent, SVG->Diag patent, SVG->RCA patent, SVG->LCx occluded. EF 60%, b. 10/31/16 LHC DES to AV groove Circ, DES to intermed branch  . Cellulitis and abscess of foot 12/2014   RT FOOT  . CHF (congestive heart failure) ([place])   . Chronic bronchitis ([place])   . Chronic lower back pain   . Diverticulosis   . Facial numbness 02/2016  . Fatty liver disease, nonalcoholic   . Gastritis   . GERD (gastroesophageal reflux disease)   . Gout   . H/O hiatal hernia   . High cholesterol   .  Hyperplastic colon polyp   . Hypertension   . IBS (irritable bowel syndrome)   . Internal hemorrhoids   . Ischemic cardiomyopathy   . Obesity   . On home oxygen therapy    "2L at night" (10/31/2016)  . OSA (obstructive sleep apnea)    "can't tolerate a mask" (10/30/2016)  . Pneumonia    "couple times" (10/31/2016)  . Shortness of breath   . TIA (transient ischemic attack)    "recently" (10/31/2016)  . Type II diabetes mellitus [facility])     Patient Active Problem List   Diagnosis Date Noted  . Vasomotor rhinitis 10/16/2016  . Right facial numbness 03/05/2016  . CKD (chronic kidney disease), stage III ([place]) 03/05/2016  . Atrial fibrillation ([place]) 10/24/2015  . Chest pain with moderate risk of acute coronary syndrome 10/23/2015  . History of TIA (transient ischemic attack) 10/22/2015  . Complete heart block ([place]) 06/22/2015  . Allergic drug rash due to anti-infective agent 04/29/2015  . Fatigue 02/14/2015  . Chronic diastolic heart failure ([place]) 02/14/2015  . Cellulitis 12/21/2014  . S/P CABG x 5 11/29/2014  . CAD S/P percutaneous coronary angioplasty   . Diarrhea 07/12/2014  . Generalized abdominal pain 07/12/2014  . Diastolic CHF, acute on chronic (HCC) 11/04/2013  . Mixed hypercholesterolemia and hypertriglyceridemia 04/22/2013  . Vertigo 04/22/2013  . Dyspnea on exertion 08/25/2012  . Allergic to IV contrast 07/23/2012  . Unstable angina ([place]) 07/22/2012  . Morbid (  severe) obesity due to excess calories ([place]) 11/22/2011  . Nonischemic cardiomyopathy ([place]) 11/22/2011  . GERD (gastroesophageal reflux disease) 11/22/2011  . Back pain 11/22/2011  . OSA (obstructive sleep apnea) 11/22/2011  . HEMORRHOIDS-EXTERNAL 02/21/2010  . NAUSEA 02/21/2010  . ABDOMINAL PAIN -GENERALIZED 02/21/2010  . PERSONAL HX COLONIC POLYPS 02/21/2010  . ANEMIA 04/16/2007  . Essential hypertension 04/16/2007  . DIVERTICULOSIS, COLON 04/16/2007  . ARTHRITIS 04/16/2007  . INTERNAL HEMORRHOIDS WITHOUT  MENTION COMP 04/12/2007  . Asthma 04/12/2007  . Cardiac resynchronization therapy pacemaker (CRT-P) in place 04/12/2007  . ACUTE GASTRITIS WITHOUT MENTION OF HEMORRHAGE 12/14/2006  . COLONIC POLYPS, HYPERPLASTIC 09/27/2002  . FATTY LIVER DISEASE 02/16/2002    Past Surgical History:  Procedure Laterality Date  . ABDOMINAL ULTRASOUND  12/01/2011   Peripelvic cysts- #1- 2.4x1.9x2.3cm, #2-1.2x0.9x1.2cm  . ANKLE FRACTURE SURGERY Right    "had rod put in"  . ANTERIOR CERVICAL DECOMP/DISCECTOMY FUSION    . APPENDECTOMY    . BACK SURGERY    . BIV ICD INSERTION CRT-D  2001?  [name] BIV PACEMAKER GENERATOR CHANGE OUT N/A 11/02/2012   Procedure: BIV PACEMAKER GENERATOR CHANGE OUT;  Surgeon: Sanda Klein, MD;  Location: [facility] CATH LAB;  Service: Cardiovascular;  Laterality: N/A;  . CARDIAC CATHETERIZATION  05/17/1999   No significant coronary obstructive disease w/ mild 20% luminal irregularity of the first diag branch of the LAD  . CARDIAC CATHETERIZATION  07/08/2002   No significant CAD, moderately depressed LV systolic function  . CARDIAC CATHETERIZATION Bilateral 04/26/2007   Normal findings, recommend medical therapy  . CARDIAC CATHETERIZATION  02/18/2008   Moderate CAD, would benefit from having a functional study, recommend continue medical therapy  . CARDIAC CATHETERIZATION  07/23/2012   Medical therapy  . CARDIAC CATHETERIZATION N/A 11/24/2014   Procedure: Left Heart Cath and Coronary Angiography;  Surgeon: Troy Sine, MD;  Location: [facility] CV LAB;  Service: Cardiovascular;  Laterality: N/A;  . CARDIAC CATHETERIZATION  11/27/2014   Procedure: Intravascular Pressure Wire/FFR Study;  Surgeon: Peter M Martinique, MD;  Location: [facility] CV LAB;  Service: Cardiovascular;;  . CARDIAC CATHETERIZATION  10/09/2016  . CORONARY ANGIOGRAM  2010  . CORONARY ARTERY BYPASS GRAFT N/A 11/29/2014   Procedure: CORONARY ARTERY BYPASS GRAFTING x 5 (LIMA-LAD, SVG-D, SVG-OM1-OM2, SVG-PD);  Surgeon: Melrose Nakayama, MD;  Location: [facility];  Service: Open Heart Surgery;  Laterality: N/A;  . CORONARY STENT INTERVENTION N/A 10/31/2016   Procedure: CORONARY STENT INTERVENTION;  Surgeon: Burnell Blanks, MD;  Location: [facility] CV LAB;  Service: Cardiovascular;  Laterality: N/A;  . FRACTURE SURGERY    . INSERT / REPLACE / [place]  . KNEE ARTHROSCOPY Bilateral   . LEFT HEART CATHETERIZATION WITH CORONARY ANGIOGRAM N/A 07/23/2012   Procedure: LEFT HEART CATHETERIZATION WITH CORONARY ANGIOGRAM;  Surgeon: Leonie Man, MD;  Location[facility] LAB;  Service: Cardiovascular;  Laterality: N/A;  Carlton Adam MYOVIEW  11/14/2011   Mild-moderate defect seen in Mid Inferolateral and Mid Anterolateral regions-consistant w/ infarct/scar. No significant ischemia demonstrated.  [name] RIGHT/LEFT HEART CATH AND CORONARY ANGIOGRAPHY N/A 10/09/2016   Procedure: RIGHT/LEFT HEART CATH AND CORONARY ANGIOGRAPHY;  Surgeon: Jolaine Artist, MD;  Location: [facility] CV LAB;  Service: Cardiovascular;  Laterality: N/A;  . TEE WITHOUT CARDIOVERSION N/A 11/29/2014   Procedure: TRANSESOPHAGEAL ECHOCARDIOGRAM (TEE);  Surgeon: Melrose Nakayama, MD;  Location: [facility];  Service: Open Heart Surgery;  Laterality: N/A;  . TRANSTHORACIC ECHOCARDIOGRAM  07/23/2012  EF 55-60%, normal-mild  . TUBAL LIGATION       OB History   None      Home Medications    Prior to Admission medications   Medication Sig Start Date End Date Taking? Authorizing Provider  acetaminophen (TYLENOL) 500 MG tablet Take 1,000 mg by mouth every 6 (six) hours as needed for mild pain.     [provider]  albuterol (PROVENTIL) (2.5 MG/3ML) 0.083% nebulizer solution Take 2.5 mg by nebulization every 6 (six) hours as needed for wheezing or shortness of breath.    [provider]  aspirin EC 81 MG tablet Take 1 tablet (81 mg total) by mouth daily. 03/27/16   Erlene Quan, PA-C  BIOTIN PO Take 1 tablet by mouth daily.     [provider]  bisoprolol (ZEBETA) 5 MG tablet TAKE 1 TABLET EVERY DAY 11/10/17   Kilroy, Doreene Burke, PA-C  budesonide-formoterol [facility]) 160-4.5 MCG/ACT inhaler Inhale 2 puffs into the lungs 2 (two) times daily.    [provider]  cetirizine (ZYRTEC) 10 MG tablet Take 10 mg by mouth daily.  03/24/17   [provider]  clopidogrel (PLAVIX) 75 MG tablet TAKE 1 TABLET EVERY DAY Patient taking differently: Take 75 mg by mouth daily.  09/08/17   Croitoru, [name], MD  clotrimazole (LOTRIMIN) 1 % cream Apply 1 application topically 2 (two) times daily. Patient taking differently: Apply 1 application topically 2 (two) times daily as needed (rash).  07/20/17   Lendon Colonel, NP  colchicine 0.6 MG tablet Take one tablet two times daily as needed. Patient not taking: Reported on 09/23/2017 09/30/16   Marybelle Killings, MD  docusate sodium (COLACE) 100 MG capsule Take 200 mg by mouth daily as needed for mild constipation.     [provider]  EPINEPHrine (EPIPEN 2-PAK) 0.3 mg/0.3 mL IJ SOAJ injection Inject 0.3 mg into the muscle once as needed (severe allergic reaction).    [provider]  ezetimibe (ZETIA) 10 MG tablet Take 1 tablet (10 mg total) by mouth daily. 07/20/17 10/18/17  Lendon Colonel, NP  fluconazole (DIFLUCAN) 100 MG tablet Take 1 tablet (100 mg total) by mouth as directed. Take 2 today and then one daily for 6 days. Patient not taking: Reported on 09/23/2017 07/21/17   Willia Craze, NP  fluticasone [facility]) 50 MCG/ACT nasal spray Place 1 spray into both nostrils at bedtime as needed for rhinitis or allergies.     [provider]  guaiFENesin (MUCINEX) 600 MG 12 hr tablet Take 600 mg by mouth at bedtime.     [provider]  hydroxypropyl methylcellulose / hypromellose (ISOPTO TEARS / GONIOVISC) 2.5 % ophthalmic solution Place 1 drop into both eyes 4 (four) times daily as needed for dry eyes.     [provider]    ipratropium (ATROVENT) 0.02 % nebulizer solution Take 2.5 mLs (0.5 mg total) by nebulization 4 (four) times daily. 09/23/17   Kinnie Feil, PA-C  ipratropium (ATROVENT) 0.06 % nasal spray 1-2 pffs up to 4 x daily as needed Patient taking differently: Place 2 sprays into both nostrils 4 (four) times daily as needed for rhinitis.  10/16/16   Tanda Rockers, MD  isosorbide mononitrate (IMDUR) 60 MG 24 hr tablet Take 1 tablet (60 mg total) by mouth daily. 06/08/17   Croitoru, [name], MD  metolazone (ZAROXOLYN) 5 MG tablet Take 1 tablet (5 mg total) by mouth as needed. Take only as directed by  [facility] Patient taking differently: Take 5 mg by mouth daily.  07/21/16   Eileen Stanford, PA-C  nitroGLYCERIN (NITROSTAT) 0.4 MG SL tablet Place 1 tablet (0.4 mg total) under the tongue every 5 (five) minutes as needed for chest pain. 11/13/16 09/23/17  Erlene Quan, PA-C  ondansetron (ZOFRAN ODT) 4 MG disintegrating tablet Take 1 tablet (4 mg total) by mouth every 8 (eight) hours as needed. Patient not taking: Reported on 09/23/2017 04/18/17   Isla Pence, MD  OXYGEN Inhale 2 L into the lungs See admin instructions. as needed for low oxygen levels & at bedtime each night.    [provider]  pantoprazole (PROTONIX) 40 MG tablet Take 1 tablet (40 mg total) by mouth daily. 12/10/16 12/10/17  Croitoru, [name], MD  potassium chloride SA (K-DUR,KLOR-CON) 20 MEQ tablet TAKE 1 TABLET EVERY DAY Patient taking differently: Take 20 mEq by mouth daily.  09/08/17   Croitoru, [name], MD  predniSONE (STERAPRED UNI-PAK 21 TAB) 10 MG (21) TBPK tablet Take by mouth daily. Take 6 tabs by mouth daily  for 2 days, then 5 tabs for 2 days, then 4 tabs for 2 days, then 3 tabs for 2 days, 2 tabs for 2 days, then 1 tab by mouth daily for 2 days 09/23/17   Kinnie Feil, PA-C  rosuvastatin (CRESTOR) 20 MG tablet TAKE 1 TABLET EVERY DAY Patient taking differently: Take 20 mg by mouth at bedtime.  11/26/16    Croitoru, [name], MD  spironolactone (ALDACTONE) 25 MG tablet TAKE 1 TABLET EVERY DAY Patient taking differently: Take 25 mg by mouth at bedtime.  11/26/16   Croitoru, [name], MD  torsemide (DEMADEX) 20 MG tablet TAKE 40 MG (2TAB) EVERY OTHER DAY ALTERNATING WITH 60 MG EVERY OTHER DAY Patient taking differently: Take 40-60 mg by mouth See admin instructions. TAKE 40 MG (2TAB) EVERY OTHER DAY ALTERNATING WITH 60 MG (2 tabs in am, 2 tabs in pm) EVERY OTHER DAY 08/18/17   Bensimhon, Shaune Pascal, MD  triamcinolone cream (KENALOG) 0.1 % Apply 1 application topically 2 (two) times daily as needed (itching).  08/24/14   [provider]  VENTOLIN HFA 108 (90 Base) MCG/ACT inhaler INHALE 2 PUFFS EVERY 4 HOURS AS NEEDED FOR WHEEZING Patient taking differently: Inhale 2 puffs into the lungs every 4 (four) hours as needed for wheezing or shortness of breath.  07/10/16   Rigoberto Noel, MD    Family History Family History  Problem Relation Age of Onset  . Breast cancer Mother   . Diabetes Mother   . Heart disease Maternal Grandmother   . Kidney disease Maternal Grandmother   . Diabetes Maternal Grandmother   . Glaucoma Maternal Aunt   . Heart disease Maternal Aunt   . Asthma Sister     Social History Social History   Tobacco Use  . Smoking status: Former Smoker    Packs/day: 0.25    Years: 3.00    Pack years: 0.75    Types: Cigarettes    Last attempt to quit: 02/11/1968    Years since quitting: 49.7  . Smokeless tobacco: Never Used  Substance Use Topics  . Alcohol use: No  . Drug use: No     Allergies   Ivp dye [iodinated diagnostic agents]; Shellfish-derived products; Sulfa antibiotics; Iodine; Atorvastatin; Doxycycline; Cephalexin; and Zithromax [azithromycin]   Review of Systems Review of Systems  Constitutional: Positive for fever.  Gastrointestinal: Positive for abdominal pain and diarrhea. Negative for hematochezia and melena.  Genitourinary: Negative for dysuria and  frequency.  All other systems reviewed and are negative.    Physical Exam Updated Vital Signs BP (!) 141/77 (BP Location: Left Arm)   Pulse 73   Temp 98.6 F (37 C) (Oral)   Resp 16   SpO2 99%   Physical Exam  Constitutional: She is oriented to person, place, and time. She appears well-developed and well-nourished. No distress.  HENT:  Head: Normocephalic and atraumatic.  Neck: Normal range of motion. Neck supple.  Cardiovascular: Normal rate and regular rhythm. Exam reveals no gallop and no friction rub.  No murmur heard. Pulmonary/Chest: Effort normal and breath sounds normal. No respiratory distress. She has no wheezes.  Abdominal: Soft. Bowel sounds are normal. She exhibits no distension. There is generalized tenderness. There is no rigidity, no rebound and no guarding.  Musculoskeletal: Normal range of motion.  Neurological: She is alert and oriented to person, place, and time.  Skin: Skin is warm and dry. She is not diaphoretic.  Nursing note and vitals reviewed.    ED Treatments / Results  Labs (all labs ordered are listed, but only abnormal results are displayed) Labs Reviewed  COMPREHENSIVE METABOLIC PANEL - Abnormal; Notable for the following components:      Result Value   Glucose, Bld 143 (*)    BUN 30 (*)    Creatinine, Ser 1.25 (*)    GFR calc non Af Amer 42 (*)    GFR calc Af Amer 48 (*)    All other components within normal limits  CBC - Abnormal; Notable for the following components:   MCHC 29.9 (*)    All other components within normal limits  LIPASE, BLOOD  URINALYSIS, ROUTINE W REFLEX MICROSCOPIC    EKG None  Radiology No results found.  Procedures Procedures (including critical care time)  Medications Ordered in ED Medications  sodium chloride 0.9 % bolus 1,000 mL (has no administration in time range)  ondansetron (ZOFRAN) injection 4 mg (has no administration in time range)  ketorolac (TORADOL) 30 MG/ML injection 30 mg (has no  administration in time range)     Initial Impression / Assessment and Plan / ED Course  I have reviewed the triage vital signs and the nursing notes.  Pertinent labs & imaging results that were available during my care of the patient were reviewed by me and considered in my medical decision making (see chart for details).  Patient presents with abdominal cramping and diarrhea for the past 5 days.  Her laboratory studies are reassuring.  She has no white count and electrolytes are essentially unremarkable.  She was given intravenous fluids as well as Toradol and Zofran and is feeling better.  CT scan shows no acute intra-abdominal process.  I suspect a viral etiology.  She will be discharged with continued fluids and return as needed.  Final Clinical Impressions(s) / ED Diagnoses   Final diagnoses:  None    ED Discharge Orders    None       Veryl Speak, MD 11/13/17 [phone]

## 2017-11-13 NOTE — Discharge Instructions (Addendum)
Continue to drink plenty of fluids and get plenty of rest.  Return to the emergency department for severe abdominal pain, bloody stool, high fever, or other new and concerning symptoms.

## 2017-11-17 ENCOUNTER — Ambulatory Visit (INDEPENDENT_AMBULATORY_CARE_PROVIDER_SITE_OTHER): Payer: Medicare HMO | Admitting: *Deleted

## 2017-11-17 DIAGNOSIS — I5022 Chronic systolic (congestive) heart failure: Secondary | ICD-10-CM

## 2017-11-17 DIAGNOSIS — I428 Other cardiomyopathies: Secondary | ICD-10-CM

## 2017-11-18 NOTE — Progress Notes (Signed)
Remote pacemaker transmission.   

## 2017-11-19 DIAGNOSIS — K8020 Calculus of gallbladder without cholecystitis without obstruction: Secondary | ICD-10-CM | POA: Diagnosis not present

## 2017-11-19 DIAGNOSIS — Z23 Encounter for immunization: Secondary | ICD-10-CM | POA: Diagnosis not present

## 2017-11-19 DIAGNOSIS — L989 Disorder of the skin and subcutaneous tissue, unspecified: Secondary | ICD-10-CM | POA: Diagnosis not present

## 2017-11-19 DIAGNOSIS — I10 Essential (primary) hypertension: Secondary | ICD-10-CM | POA: Diagnosis not present

## 2017-11-19 DIAGNOSIS — R0902 Hypoxemia: Secondary | ICD-10-CM | POA: Diagnosis not present

## 2017-11-20 ENCOUNTER — Encounter (HOSPITAL_COMMUNITY): Payer: Self-pay

## 2017-11-20 ENCOUNTER — Other Ambulatory Visit: Payer: Self-pay

## 2017-11-20 ENCOUNTER — Observation Stay (HOSPITAL_COMMUNITY)
Admission: EM | Admit: 2017-11-20 | Discharge: 2017-11-22 | Disposition: A | Discharge status: Home / Self Care | Payer: Medicare HMO | Attending: Internal Medicine | Admitting: Internal Medicine

## 2017-11-20 ENCOUNTER — Emergency Department (HOSPITAL_COMMUNITY): Payer: Medicare HMO

## 2017-11-20 DIAGNOSIS — Z981 Arthrodesis status: Secondary | ICD-10-CM | POA: Insufficient documentation

## 2017-11-20 DIAGNOSIS — I4891 Unspecified atrial fibrillation: Secondary | ICD-10-CM | POA: Insufficient documentation

## 2017-11-20 DIAGNOSIS — Z825 Family history of asthma and other chronic lower respiratory diseases: Secondary | ICD-10-CM | POA: Insufficient documentation

## 2017-11-20 DIAGNOSIS — K649 Unspecified hemorrhoids: Secondary | ICD-10-CM | POA: Diagnosis not present

## 2017-11-20 DIAGNOSIS — N186 End stage renal disease: Secondary | ICD-10-CM | POA: Diagnosis present

## 2017-11-20 DIAGNOSIS — E7800 Pure hypercholesterolemia, unspecified: Secondary | ICD-10-CM | POA: Insufficient documentation

## 2017-11-20 DIAGNOSIS — Z91041 Radiographic dye allergy status: Secondary | ICD-10-CM | POA: Insufficient documentation

## 2017-11-20 DIAGNOSIS — I25110 Atherosclerotic heart disease of native coronary artery with unstable angina pectoris: Secondary | ICD-10-CM | POA: Insufficient documentation

## 2017-11-20 DIAGNOSIS — Z820 Family history of epilepsy and other diseases of the nervous system: Secondary | ICD-10-CM | POA: Insufficient documentation

## 2017-11-20 DIAGNOSIS — G8929 Other chronic pain: Secondary | ICD-10-CM | POA: Diagnosis not present

## 2017-11-20 DIAGNOSIS — K5790 Diverticulosis of intestine, part unspecified, without perforation or abscess without bleeding: Secondary | ICD-10-CM | POA: Diagnosis not present

## 2017-11-20 DIAGNOSIS — N183 Chronic kidney disease, stage 3 unspecified: Secondary | ICD-10-CM | POA: Diagnosis present

## 2017-11-20 DIAGNOSIS — Z9981 Dependence on supplemental oxygen: Secondary | ICD-10-CM | POA: Insufficient documentation

## 2017-11-20 DIAGNOSIS — Z833 Family history of diabetes mellitus: Secondary | ICD-10-CM | POA: Insufficient documentation

## 2017-11-20 DIAGNOSIS — E876 Hypokalemia: Secondary | ICD-10-CM | POA: Diagnosis not present

## 2017-11-20 DIAGNOSIS — K219 Gastro-esophageal reflux disease without esophagitis: Secondary | ICD-10-CM | POA: Diagnosis not present

## 2017-11-20 DIAGNOSIS — Z6841 Body Mass Index (BMI) 40.0 and over, adult: Secondary | ICD-10-CM | POA: Insufficient documentation

## 2017-11-20 DIAGNOSIS — E1122 Type 2 diabetes mellitus with diabetic chronic kidney disease: Secondary | ICD-10-CM | POA: Insufficient documentation

## 2017-11-20 DIAGNOSIS — G4733 Obstructive sleep apnea (adult) (pediatric): Secondary | ICD-10-CM | POA: Diagnosis present

## 2017-11-20 DIAGNOSIS — M109 Gout, unspecified: Secondary | ICD-10-CM | POA: Diagnosis not present

## 2017-11-20 DIAGNOSIS — M1990 Unspecified osteoarthritis, unspecified site: Secondary | ICD-10-CM | POA: Insufficient documentation

## 2017-11-20 DIAGNOSIS — J9610 Chronic respiratory failure, unspecified whether with hypoxia or hypercapnia: Secondary | ICD-10-CM | POA: Insufficient documentation

## 2017-11-20 DIAGNOSIS — N189 Chronic kidney disease, unspecified: Secondary | ICD-10-CM

## 2017-11-20 DIAGNOSIS — Z87891 Personal history of nicotine dependence: Secondary | ICD-10-CM | POA: Insufficient documentation

## 2017-11-20 DIAGNOSIS — Z86010 Personal history of colonic polyps: Secondary | ICD-10-CM | POA: Diagnosis not present

## 2017-11-20 DIAGNOSIS — Z91013 Allergy to seafood: Secondary | ICD-10-CM | POA: Insufficient documentation

## 2017-11-20 DIAGNOSIS — Z794 Long term (current) use of insulin: Secondary | ICD-10-CM | POA: Insufficient documentation

## 2017-11-20 DIAGNOSIS — E119 Type 2 diabetes mellitus without complications: Secondary | ICD-10-CM | POA: Diagnosis not present

## 2017-11-20 DIAGNOSIS — I25118 Atherosclerotic heart disease of native coronary artery with other forms of angina pectoris: Secondary | ICD-10-CM

## 2017-11-20 DIAGNOSIS — M545 Low back pain: Secondary | ICD-10-CM | POA: Insufficient documentation

## 2017-11-20 DIAGNOSIS — Z8249 Family history of ischemic heart disease and other diseases of the circulatory system: Secondary | ICD-10-CM | POA: Insufficient documentation

## 2017-11-20 DIAGNOSIS — K5730 Diverticulosis of large intestine without perforation or abscess without bleeding: Secondary | ICD-10-CM | POA: Insufficient documentation

## 2017-11-20 DIAGNOSIS — J45909 Unspecified asthma, uncomplicated: Secondary | ICD-10-CM | POA: Diagnosis not present

## 2017-11-20 DIAGNOSIS — E1169 Type 2 diabetes mellitus with other specified complication: Secondary | ICD-10-CM

## 2017-11-20 DIAGNOSIS — R1013 Epigastric pain: Secondary | ICD-10-CM | POA: Diagnosis not present

## 2017-11-20 DIAGNOSIS — K449 Diaphragmatic hernia without obstruction or gangrene: Secondary | ICD-10-CM | POA: Diagnosis not present

## 2017-11-20 DIAGNOSIS — Z7902 Long term (current) use of antithrombotics/antiplatelets: Secondary | ICD-10-CM | POA: Insufficient documentation

## 2017-11-20 DIAGNOSIS — I2510 Atherosclerotic heart disease of native coronary artery without angina pectoris: Secondary | ICD-10-CM

## 2017-11-20 DIAGNOSIS — I255 Ischemic cardiomyopathy: Secondary | ICD-10-CM | POA: Insufficient documentation

## 2017-11-20 DIAGNOSIS — Z7951 Long term (current) use of inhaled steroids: Secondary | ICD-10-CM | POA: Insufficient documentation

## 2017-11-20 DIAGNOSIS — Z955 Presence of coronary angioplasty implant and graft: Secondary | ICD-10-CM | POA: Insufficient documentation

## 2017-11-20 DIAGNOSIS — K589 Irritable bowel syndrome without diarrhea: Secondary | ICD-10-CM | POA: Diagnosis not present

## 2017-11-20 DIAGNOSIS — N179 Acute kidney failure, unspecified: Secondary | ICD-10-CM | POA: Diagnosis not present

## 2017-11-20 DIAGNOSIS — I130 Hypertensive heart and chronic kidney disease with heart failure and stage 1 through stage 4 chronic kidney disease, or unspecified chronic kidney disease: Secondary | ICD-10-CM | POA: Insufficient documentation

## 2017-11-20 DIAGNOSIS — E781 Pure hyperglyceridemia: Secondary | ICD-10-CM | POA: Insufficient documentation

## 2017-11-20 DIAGNOSIS — R1033 Periumbilical pain: Secondary | ICD-10-CM | POA: Diagnosis not present

## 2017-11-20 DIAGNOSIS — Z803 Family history of malignant neoplasm of breast: Secondary | ICD-10-CM | POA: Insufficient documentation

## 2017-11-20 DIAGNOSIS — I10 Essential (primary) hypertension: Secondary | ICD-10-CM | POA: Diagnosis not present

## 2017-11-20 DIAGNOSIS — K8020 Calculus of gallbladder without cholecystitis without obstruction: Secondary | ICD-10-CM | POA: Diagnosis not present

## 2017-11-20 DIAGNOSIS — Z9049 Acquired absence of other specified parts of digestive tract: Secondary | ICD-10-CM | POA: Insufficient documentation

## 2017-11-20 DIAGNOSIS — Z881 Allergy status to other antibiotic agents status: Secondary | ICD-10-CM | POA: Insufficient documentation

## 2017-11-20 DIAGNOSIS — Z888 Allergy status to other drugs, medicaments and biological substances status: Secondary | ICD-10-CM | POA: Insufficient documentation

## 2017-11-20 DIAGNOSIS — N281 Cyst of kidney, acquired: Secondary | ICD-10-CM | POA: Insufficient documentation

## 2017-11-20 DIAGNOSIS — Z79899 Other long term (current) drug therapy: Secondary | ICD-10-CM | POA: Insufficient documentation

## 2017-11-20 DIAGNOSIS — Z95810 Presence of automatic (implantable) cardiac defibrillator: Secondary | ICD-10-CM | POA: Diagnosis not present

## 2017-11-20 DIAGNOSIS — I5032 Chronic diastolic (congestive) heart failure: Secondary | ICD-10-CM | POA: Diagnosis present

## 2017-11-20 DIAGNOSIS — K760 Fatty (change of) liver, not elsewhere classified: Secondary | ICD-10-CM | POA: Insufficient documentation

## 2017-11-20 DIAGNOSIS — Z951 Presence of aortocoronary bypass graft: Secondary | ICD-10-CM | POA: Insufficient documentation

## 2017-11-20 DIAGNOSIS — D631 Anemia in chronic kidney disease: Secondary | ICD-10-CM | POA: Insufficient documentation

## 2017-11-20 DIAGNOSIS — Z882 Allergy status to sulfonamides status: Secondary | ICD-10-CM | POA: Insufficient documentation

## 2017-11-20 DIAGNOSIS — Z8673 Personal history of transient ischemic attack (TIA), and cerebral infarction without residual deficits: Secondary | ICD-10-CM | POA: Insufficient documentation

## 2017-11-20 DIAGNOSIS — I700 Atherosclerosis of aorta: Secondary | ICD-10-CM | POA: Insufficient documentation

## 2017-11-20 DIAGNOSIS — Z841 Family history of disorders of kidney and ureter: Secondary | ICD-10-CM | POA: Insufficient documentation

## 2017-11-20 DIAGNOSIS — Z7982 Long term (current) use of aspirin: Secondary | ICD-10-CM | POA: Insufficient documentation

## 2017-11-20 LAB — URINALYSIS, ROUTINE W REFLEX MICROSCOPIC
Bilirubin Urine: NEGATIVE
Glucose, UA: NEGATIVE mg/dL
Hgb urine dipstick: NEGATIVE
Ketones, ur: NEGATIVE mg/dL
Nitrite: NEGATIVE
Protein, ur: NEGATIVE mg/dL
Specific Gravity, Urine: 1.013 (ref 1.005–1.030)
pH: 6 (ref 5.0–8.0)

## 2017-11-20 LAB — LIPASE, BLOOD: Lipase: 65 U/L — ABNORMAL HIGH (ref 11–51)

## 2017-11-20 LAB — CBC
HCT: 42.4 % (ref 36.0–46.0)
Hemoglobin: 12.5 g/dL (ref 12.0–15.0)
MCH: 27.5 pg (ref 26.0–34.0)
MCHC: 29.5 g/dL — ABNORMAL LOW (ref 30.0–36.0)
MCV: 93.2 fL (ref 80.0–100.0)
Platelets: 176 10*3/uL (ref 150–400)
RBC: 4.55 MIL/uL (ref 3.87–5.11)
RDW: 14.8 % (ref 11.5–15.5)
WBC: 8.2 10*3/uL (ref 4.0–10.5)
nRBC: 0 % (ref 0.0–0.2)

## 2017-11-20 LAB — COMPREHENSIVE METABOLIC PANEL
ALT: 20 U/L (ref 0–44)
AST: 19 U/L (ref 15–41)
Albumin: 3.5 g/dL (ref 3.5–5.0)
Alkaline Phosphatase: 85 U/L (ref 38–126)
Anion gap: 14 (ref 5–15)
BUN: 67 mg/dL — ABNORMAL HIGH (ref 8–23)
CO2: 36 mmol/L — ABNORMAL HIGH (ref 22–32)
Calcium: 9.1 mg/dL (ref 8.9–10.3)
Chloride: 92 mmol/L — ABNORMAL LOW (ref 98–111)
Creatinine, Ser: 1.61 mg/dL — ABNORMAL HIGH (ref 0.44–1.00)
GFR calc Af Amer: 36 mL/min — ABNORMAL LOW (ref >60)
GFR calc non Af Amer: 31 mL/min — ABNORMAL LOW (ref >60)
Glucose, Bld: 132 mg/dL — ABNORMAL HIGH (ref 70–99)
Potassium: 3.4 mmol/L — ABNORMAL LOW (ref 3.5–5.1)
Sodium: 142 mmol/L (ref 135–145)
Total Bilirubin: 0.8 mg/dL (ref 0.3–1.2)
Total Protein: 6.9 g/dL (ref 6.5–8.1)

## 2017-11-20 MED ORDER — POLYETHYLENE GLYCOL 3350 17 G PO PACK: 17.0000 g | PACK | Freq: Every day | ORAL | Status: DC | PRN

## 2017-11-20 MED ORDER — EZETIMIBE 10 MG PO TABS: 10.0000 mg | ORAL_TABLET | Freq: Every day | ORAL | Status: DC

## 2017-11-20 MED ORDER — ONDANSETRON HCL 4 MG/2ML IJ SOLN
4.0000 mg | Freq: Once | INTRAMUSCULAR | Status: AC
  Administered 2017-11-20: 4 mg via INTRAVENOUS
  Filled 2017-11-20: qty 2

## 2017-11-20 MED ORDER — FLUTICASONE PROPIONATE 50 MCG/ACT NA SUSP
1.0000 | Freq: Every evening | NASAL | Status: DC | PRN
  Filled 2017-11-20: qty 16

## 2017-11-20 MED ORDER — IPRATROPIUM BROMIDE 0.02 % IN SOLN
0.5000 mg | Freq: Four times a day (QID) | RESPIRATORY_TRACT | Status: DC
  Filled 2017-11-20: qty 2.5

## 2017-11-20 MED ORDER — CLOPIDOGREL BISULFATE 75 MG PO TABS
75.0000 mg | ORAL_TABLET | Freq: Every day | ORAL | Status: DC
  Administered 2017-11-22: 75 mg via ORAL
  Filled 2017-11-20 (×2): qty 1

## 2017-11-20 MED ORDER — ROSUVASTATIN CALCIUM 10 MG PO TABS
20.0000 mg | ORAL_TABLET | Freq: Every day | ORAL | Status: DC
  Administered 2017-11-20 – 2017-11-21 (×2): 20 mg via ORAL
  Filled 2017-11-20 (×2): qty 2

## 2017-11-20 MED ORDER — SODIUM CHLORIDE 0.9 % IV BOLUS
500.0000 mL | Freq: Once | INTRAVENOUS | Status: AC
  Administered 2017-11-20: 500 mL via INTRAVENOUS

## 2017-11-20 MED ORDER — NITROGLYCERIN 0.4 MG SL SUBL: 0.4000 mg | SUBLINGUAL_TABLET | SUBLINGUAL | Status: DC | PRN

## 2017-11-20 MED ORDER — ISOSORBIDE MONONITRATE ER 60 MG PO TB24
60.0000 mg | ORAL_TABLET | Freq: Every day | ORAL | Status: DC
  Administered 2017-11-21 – 2017-11-22 (×2): 60 mg via ORAL
  Filled 2017-11-20 (×2): qty 1

## 2017-11-20 MED ORDER — BISACODYL 10 MG RE SUPP: 10.0000 mg | Freq: Every day | RECTAL | Status: DC | PRN

## 2017-11-20 MED ORDER — ALBUTEROL SULFATE (2.5 MG/3ML) 0.083% IN NEBU
2.5000 mg | INHALATION_SOLUTION | Freq: Four times a day (QID) | RESPIRATORY_TRACT | Status: DC | PRN
  Administered 2017-11-21: 2.5 mg via RESPIRATORY_TRACT
  Filled 2017-11-20: qty 3

## 2017-11-20 MED ORDER — ACETAMINOPHEN 500 MG PO TABS: 1000.0000 mg | ORAL_TABLET | Freq: Four times a day (QID) | ORAL | Status: DC | PRN

## 2017-11-20 MED ORDER — ONDANSETRON HCL 4 MG PO TABS: 4.0000 mg | ORAL_TABLET | Freq: Four times a day (QID) | ORAL | Status: DC | PRN

## 2017-11-20 MED ORDER — CLOPIDOGREL BISULFATE 75 MG PO TABS: 75.0000 mg | ORAL_TABLET | Freq: Every day | ORAL | Status: DC

## 2017-11-20 MED ORDER — LORATADINE 10 MG PO TABS
10.0000 mg | ORAL_TABLET | Freq: Every day | ORAL | Status: DC
  Administered 2017-11-21 – 2017-11-22 (×2): 10 mg via ORAL
  Filled 2017-11-20 (×2): qty 1

## 2017-11-20 MED ORDER — ACETAMINOPHEN 650 MG RE SUPP: 650.0000 mg | Freq: Four times a day (QID) | RECTAL | Status: DC | PRN

## 2017-11-20 MED ORDER — ONDANSETRON HCL 4 MG/2ML IJ SOLN: 4.0000 mg | Freq: Four times a day (QID) | INTRAMUSCULAR | Status: DC | PRN

## 2017-11-20 MED ORDER — TRAMADOL HCL 50 MG PO TABS
50.0000 mg | ORAL_TABLET | Freq: Four times a day (QID) | ORAL | Status: DC | PRN
  Administered 2017-11-21 – 2017-11-22 (×2): 50 mg via ORAL
  Filled 2017-11-20 (×2): qty 1

## 2017-11-20 MED ORDER — MOMETASONE FURO-FORMOTEROL FUM 200-5 MCG/ACT IN AERO
2.0000 | INHALATION_SPRAY | Freq: Two times a day (BID) | RESPIRATORY_TRACT | Status: DC
  Administered 2017-11-21 – 2017-11-22 (×3): 2 via RESPIRATORY_TRACT
  Filled 2017-11-20 (×2): qty 8.8

## 2017-11-20 MED ORDER — SODIUM CHLORIDE 0.9 % IV SOLN
INTRAVENOUS | Status: DC
  Administered 2017-11-20 – 2017-11-21 (×4): via INTRAVENOUS

## 2017-11-20 MED ORDER — DOCUSATE SODIUM 100 MG PO CAPS: 200.0000 mg | ORAL_CAPSULE | Freq: Every day | ORAL | Status: DC | PRN

## 2017-11-20 MED ORDER — BISOPROLOL FUMARATE 5 MG PO TABS
5.0000 mg | ORAL_TABLET | Freq: Every day | ORAL | Status: DC
  Administered 2017-11-21 – 2017-11-22 (×2): 5 mg via ORAL
  Filled 2017-11-20 (×2): qty 1

## 2017-11-20 MED ORDER — EZETIMIBE 10 MG PO TABS
10.0000 mg | ORAL_TABLET | Freq: Every day | ORAL | Status: DC
  Administered 2017-11-21 – 2017-11-22 (×2): 10 mg via ORAL
  Filled 2017-11-20 (×3): qty 1

## 2017-11-20 MED ORDER — GUAIFENESIN ER 600 MG PO TB12
600.0000 mg | ORAL_TABLET | Freq: Every day | ORAL | Status: DC
  Administered 2017-11-20 – 2017-11-21 (×2): 600 mg via ORAL
  Filled 2017-11-20 (×2): qty 1

## 2017-11-20 MED ORDER — ENOXAPARIN SODIUM 40 MG/0.4ML ~~LOC~~ SOLN
40.0000 mg | Freq: Every day | SUBCUTANEOUS | Status: DC
  Administered 2017-11-20 – 2017-11-21 (×2): 40 mg via SUBCUTANEOUS
  Filled 2017-11-20 (×2): qty 0.4

## 2017-11-20 MED ORDER — MORPHINE SULFATE (PF) 4 MG/ML IV SOLN
4.0000 mg | Freq: Once | INTRAVENOUS | Status: AC
  Administered 2017-11-20: 4 mg via INTRAVENOUS
  Filled 2017-11-20: qty 1

## 2017-11-20 MED ORDER — ACETAMINOPHEN 325 MG PO TABS: 650.0000 mg | ORAL_TABLET | Freq: Four times a day (QID) | ORAL | Status: DC | PRN

## 2017-11-20 MED ORDER — POLYVINYL ALCOHOL 1.4 % OP SOLN
1.0000 [drp] | Freq: Four times a day (QID) | OPHTHALMIC | Status: DC | PRN
  Filled 2017-11-20: qty 15

## 2017-11-20 MED ORDER — ASPIRIN EC 81 MG PO TBEC
81.0000 mg | DELAYED_RELEASE_TABLET | Freq: Every day | ORAL | Status: DC
  Administered 2017-11-21 – 2017-11-22 (×2): 81 mg via ORAL
  Filled 2017-11-20 (×2): qty 1

## 2017-11-20 MED ORDER — INSULIN ASPART 100 UNIT/ML ~~LOC~~ SOLN
0.0000 [IU] | Freq: Three times a day (TID) | SUBCUTANEOUS | Status: DC
  Administered 2017-11-21 – 2017-11-22 (×3): 2 [IU] via SUBCUTANEOUS

## 2017-11-20 MED ORDER — PANTOPRAZOLE SODIUM 40 MG PO TBEC
40.0000 mg | DELAYED_RELEASE_TABLET | Freq: Every day | ORAL | Status: DC
  Administered 2017-11-21 – 2017-11-22 (×2): 40 mg via ORAL
  Filled 2017-11-20 (×2): qty 1

## 2017-11-20 MED ORDER — MOMETASONE FURO-FORMOTEROL FUM 200-5 MCG/ACT IN AERO
2.0000 | INHALATION_SPRAY | Freq: Two times a day (BID) | RESPIRATORY_TRACT | Status: DC
  Filled 2017-11-20: qty 8.8

## 2017-11-20 MED ORDER — IPRATROPIUM BROMIDE 0.06 % NA SOLN
2.0000 | Freq: Four times a day (QID) | NASAL | Status: DC | PRN
  Filled 2017-11-20: qty 15

## 2017-11-20 NOTE — ED Notes (Signed)
ED TO INPATIENT HANDOFF REPORT  Name/Age/Gender [name] 72 y.o. female  Code Status Code Status History    Date Active Date Inactive Code Status Order ID Comments User Context   10/30/2016 1503 11/01/2016 1332 Full Code [id number]  Leanor Kail, [place] [facility]   10/09/2016 1342 10/09/2016 2020 Full Code [id number]  Jolaine Artist, MD Inpatient   03/05/2016 1227 03/06/2016 2220 Full Code [id number]  Lily Kocher, MD ED   03/05/2016 1227 03/05/2016 1227 Full Code [id number]  Lily Kocher, MD ED   10/23/2015 0316 10/24/2015 2117 Full Code [id number]  Toy Baker, MD Inpatient   04/29/2015 1945 05/05/2015 1929 Full Code [id number]  Vianne Bulls, MD ED   12/21/2014 1552 01/02/2015 0000 Full Code [id number]  Barrett, Lodema Hong, PA-C Inpatient   11/29/2014 1523 12/07/2014 1858 Full Code [id number]  Coolidge Breeze, PA-C Inpatient   11/27/2014 1529 11/29/2014 1523 Full Code [id number]  Martinique, Peter M, MD Inpatient   11/24/2014 1545 11/27/2014 1529 Full Code [id number]  Troy Sine, MD Inpatient   11/23/2014 1657 11/24/2014 1545 Full Code [id number]  [name], NP Inpatient   07/23/2012 0125 07/24/2012 1543 Full Code [id number]  Toy Baker, MD Inpatient   11/22/2011 1442 11/23/2011 2104 Full Code [id number]  Kome, Noralee Space, RN Inpatient      Home/SNF/Other Home  Chief Complaint Abd. Pain/Mult. Sympt.   Level of Care/Admitting Diagnosis ED Disposition    ED Disposition Condition Comment   Admit  Hospital Area: [facility] [[id number]]  Level of Care: Med-Surg [16]  Diagnosis: AKI (acute kidney injury) [facility]) [[id number]]  Admitting Physician: Shelbie Proctor [[id number]]  Attending Physician: Shelbie Proctor [[id number]]  PT Class (Do Not Modify): Observation [104]  PT Acc Code (Do Not Modify): Observation [10022]       Medical History Past Medical History:  Diagnosis Date  . AICD (automatic cardioverter/defibrillator) present   . Anemia   .  Arthritis   . Asthma   . CAD (coronary artery disease)    a. 10/09/16 LHC: SVG->LAD patent, SVG->Diag patent, SVG->RCA patent, SVG->LCx occluded. EF 60%, b. 10/31/16 LHC DES to AV groove Circ, DES to intermed branch  . Cellulitis and abscess of foot 12/2014   RT FOOT  . CHF (congestive heart failure) ([place])   . Chronic bronchitis ([place])   . Chronic lower back pain   . Diverticulosis   . Facial numbness 02/2016  . Fatty liver disease, nonalcoholic   . Gastritis   . GERD (gastroesophageal reflux disease)   . Gout   . H/O hiatal hernia   . High cholesterol   . Hyperplastic colon polyp   . Hypertension   . IBS (irritable bowel syndrome)   . Internal hemorrhoids   . Ischemic cardiomyopathy   . Obesity   . On home oxygen therapy    "2L at night" (10/31/2016)  . OSA (obstructive sleep apnea)    "can't tolerate a mask" (10/30/2016)  . Pneumonia    "couple times" (10/31/2016)  . Shortness of breath   . TIA (transient ischemic attack)    "recently" (10/31/2016)  . Type II diabetes mellitus (HCC)     Allergies Allergies  Allergen Reactions  . Ivp Dye [Iodinated Diagnostic Agents] Shortness Of Breath    No reaction to PO contrast with non-ionic dye.06-25-2014/rsm  . Shellfish-Derived Products Anaphylaxis  . Sulfa Antibiotics Shortness Of Breath  . Iodine Hives  . Atorvastatin Other (See Comments)    Pt states "causes bilateral leg pain/cramps."  .  Doxycycline   . Cephalexin Itching and Rash  . Zithromax [Azithromycin] Rash    IV Location/Drains/Wounds Patient Lines/Drains/Airways Status   Active Line/Drains/Airways    Name:   Placement date:   Placement time:   Site:   Days:   Peripheral IV 11/20/17 Left;Anterior;Lateral Forearm   11/20/17    2025    Forearm   less than 1          Labs/Imaging Results for orders placed or performed during the hospital encounter of 11/20/17 (from the past 48 hour(s))  Lipase, blood     Status: Abnormal   Collection Time: 11/20/17  5:06 PM   Result Value Ref Range   Lipase 65 (H) 11 - 51 U/L    Comment: Performed at [facility], [place] [address] [place], [place] [address]  Comprehensive metabolic panel     Status: Abnormal   Collection Time: 11/20/17  5:06 PM  Result Value Ref Range   Sodium 142 135 - 145 mmol/L   Potassium 3.4 (L) 3.5 - 5.1 mmol/L   Chloride 92 (L) 98 - 111 mmol/L   CO2 36 (H) 22 - 32 mmol/L   Glucose, Bld 132 (H) 70 - 99 mg/dL   BUN 67 (H) 8 - 23 mg/dL   Creatinine, Ser 1.61 (H) 0.44 - 1.00 mg/dL   Calcium 9.1 8.9 - 10.3 mg/dL   Total Protein 6.9 6.5 - 8.1 g/dL   Albumin 3.5 3.5 - 5.0 g/dL   AST 19 15 - 41 U/L   ALT 20 0 - 44 U/L   Alkaline Phosphatase 85 38 - 126 U/L   Total Bilirubin 0.8 0.3 - 1.2 mg/dL   GFR calc non Af Amer 31 (L) >60 mL/min   GFR calc Af Amer 36 (L) >60 mL/min    Comment: (NOTE) The eGFR has been calculated using the CKD EPI equation. This calculation has not been validated in all clinical situations. eGFR's persistently <60 mL/min signify possible Chronic Kidney Disease.    Anion gap 14 5 - 15    Comment: Performed at [facility], [place] [address] [place], [place] [address]  CBC     Status: Abnormal   Collection Time: 11/20/17  5:06 PM  Result Value Ref Range   WBC 8.2 4.0 - 10.5 K/uL   RBC 4.55 3.87 - 5.11 MIL/uL   Hemoglobin 12.5 12.0 - 15.0 g/dL   HCT 42.4 36.0 - 46.0 %   MCV 93.2 80.0 - 100.0 fL   MCH 27.5 26.0 - 34.0 pg   MCHC 29.5 (L) 30.0 - 36.0 g/dL   RDW 14.8 11.5 - 15.5 %   Platelets 176 150 - 400 K/uL   nRBC 0.0 0.0 - 0.2 %    Comment: Performed at [facility], [place] [address], [place], [place] [address]  Urinalysis, Routine w reflex microscopic     Status: Abnormal   Collection Time: 11/20/17  6:20 PM  Result Value Ref Range   Color, Urine YELLOW YELLOW   APPearance CLEAR CLEAR   Specific Gravity, Urine 1.013 1.005 - 1.030   pH 6.0 5.0 - 8.0   Glucose, UA NEGATIVE NEGATIVE mg/dL   Hgb urine  dipstick NEGATIVE NEGATIVE   Bilirubin Urine NEGATIVE NEGATIVE   Ketones, ur NEGATIVE NEGATIVE mg/dL   Protein, ur NEGATIVE NEGATIVE mg/dL   Nitrite NEGATIVE NEGATIVE   Leukocytes, UA TRACE (A) NEGATIVE   RBC / HPF 0-5 0 - 5 RBC/hpf   WBC,  UA 0-5 0 - 5 WBC/hpf   Bacteria, UA RARE (A) NONE SEEN   Squamous Epithelial / LPF 0-5 0 - 5    Comment: Performed at [facility], [place] [address], [place], [place] [address]   Ct Abdomen Pelvis Wo Contrast  Result Date: 11/20/2017 CLINICAL DATA:  Abdominal pain and nausea. EXAM: CT ABDOMEN AND PELVIS WITHOUT CONTRAST TECHNIQUE: Multidetector CT imaging of the abdomen and pelvis was performed following the standard protocol without IV contrast. COMPARISON:  CT abdomen pelvis dated November 13, 2017. FINDINGS: Lower chest: No acute abnormality. Stable-3 mm pulmonary nodules in the right lower lobe. Hepatobiliary: No focal liver abnormality is seen. Unchanged small layering gallstones. No gallbladder wall thickening or biliary dilatation. Pancreas: Unremarkable. No pancreatic ductal dilatation or surrounding inflammatory changes. Spleen: Normal in size without focal abnormality. Adrenals/Urinary Tract: The adrenal glands are unremarkable. Unchanged mild bilateral renal atrophy. Unchanged small bilateral renal cysts. No renal calculi or hydronephrosis. The bladder is unremarkable. Stomach/Bowel: Stomach is within normal limits. Appendix not visualized in this patient with prior history of appendectomy. No evidence of bowel wall thickening, distention, or inflammatory changes. Sigmoid diverticulosis. Vascular/Lymphatic: Aortic atherosclerosis. No enlarged abdominal or pelvic lymph nodes. Reproductive: Uterus and bilateral adnexa are unremarkable. Other: No abdominal wall hernia or abnormality. No abdominopelvic ascites. No pneumoperitoneum. Musculoskeletal: No acute or significant osseous findings. Stable moderate to severe multilevel lumbar spondylosis  and grade 1 anterolisthesis at L5-S1. IMPRESSION: 1.  No acute intra-abdominal process. 2. Cholelithiasis. 3.  Aortic atherosclerosis (ICD10-I70.0). Electronically Signed   By: Titus Dubin M.D.   On: 11/20/2017 20:19   None  Pending Labs Unresulted Labs (From admission, onward)    Start     Ordered   Signed and Held  CBC  (enoxaparin (LOVENOX)    CrCl >/= 30 ml/min)  Once,   R    Comments:  Baseline for enoxaparin therapy IF NOT ALREADY DRAWN.  Notify MD if PLT < 100 K.    Signed and Held   Signed and Held  Creatinine, serum  (enoxaparin (LOVENOX)    CrCl >/= 30 ml/min)  Once,   R    Comments:  Baseline for enoxaparin therapy IF NOT ALREADY DRAWN.    Signed and Held   Signed and Held  Creatinine, serum  (enoxaparin (LOVENOX)    CrCl >/= 30 ml/min)  Weekly,   R    Comments:  while on enoxaparin therapy    Signed and Held   Signed and Held  Basic metabolic panel  Tomorrow morning,   R     Signed and Held          Vitals/Pain Today's Vitals   11/20/17 1828 11/20/17 1912 11/20/17 2115 11/20/17 2148  BP:  133/62 (!) 133/111   Pulse:  65 66   Resp:  18 17   Temp:      TempSrc:      SpO2:  97% 99%   Weight:      Height:      PainSc: 8    6     Isolation Precautions No active isolations  Medications Medications  sodium chloride 0.9 % bolus 500 mL (500 mLs Intravenous New Bag/Given 11/20/17 2036)  morphine 4 MG/ML injection 4 mg (4 mg Intravenous Given 11/20/17 2037)  ondansetron (ZOFRAN) injection 4 mg (4 mg Intravenous Given 11/20/17 2037)    Mobility walks with device

## 2017-11-20 NOTE — ED Triage Notes (Signed)
Patient reports abdominal pain, nausea, and loose stools, starting 11/10/17. Patient was seen at [facility] 10/3 and was told she had gall stones. Patient reports her pain and nausea have not gotten better. Patient reports she has an appointment with her "stomach doctor" Monday, but couldn't wait that long for pain and nausea relief. Patient reports a fever of 101F last night and patient took tylenol, which broke her fever. Patient afebrile in triage. Patient lives on [facility].

## 2017-11-20 NOTE — ED Provider Notes (Signed)
[facility] DEPT Provider Note   CSN: [id number] Arrival date & time: 11/20/17  1616     History   Chief Complaint Chief Complaint  Patient presents with  . Abdominal Pain    HPI [name] is a 72 y.o. female with history of CAD, CHF, GERD, diverticulosis, nonalcoholic fatty liver disease, hypertension, IBS, OSA, type 2 diabetes mellitus, ischemic cardiomyopathy with AICD presents for evaluation of acute onset, progressively worsening abdominal pain for 10 days.  She was seen and evaluated for this in the ED on 11/13/2017 and was found to be stable for discharge home and was told her symptoms were likely due to gastroenteritis.  Symptoms were initially intermittent but pain is now constant epigastric pain radiating to the back and left shoulder.  Worsens after meals as does nausea.  Denies vomiting.  Had multiple episodes of nonbloody watery diarrhea daily.  Symptoms began after traveling to [place], denies any recent travel out of the country.  No recent treatment with antibiotics.  Went to see her PCP yesterday, was found to have low blood pressure.  PCP is in the process of setting up follow-up with gastroenterology on Monday the patient states her pain is uncontrolled and she cannot wait that long.  Denies urinary symptoms, melena, hematochezia, hematuria. Also notes fatigue.   Has been taking Tylenol without relief of her symptoms.  The history is provided by the patient.    Past Medical History:  Diagnosis Date  . AICD (automatic cardioverter/defibrillator) present   . Anemia   . Arthritis   . Asthma   . CAD (coronary artery disease)    a. 10/09/16 LHC: SVG->LAD patent, SVG->Diag patent, SVG->RCA patent, SVG->LCx occluded. EF 60%, b. 10/31/16 LHC DES to AV groove Circ, DES to intermed branch  . Cellulitis and abscess of foot 12/2014   RT FOOT  . CHF (congestive heart failure) ([place])   . Chronic bronchitis ([place])   . Chronic lower back pain     . Diverticulosis   . Facial numbness 02/2016  . Fatty liver disease, nonalcoholic   . Gastritis   . GERD (gastroesophageal reflux disease)   . Gout   . H/O hiatal hernia   . High cholesterol   . Hyperplastic colon polyp   . Hypertension   . IBS (irritable bowel syndrome)   . Internal hemorrhoids   . Ischemic cardiomyopathy   . Obesity   . On home oxygen therapy    "2L at night" (10/31/2016)  . OSA (obstructive sleep apnea)    "can't tolerate a mask" (10/30/2016)  . Pneumonia    "couple times" (10/31/2016)  . Shortness of breath   . TIA (transient ischemic attack)    "recently" (10/31/2016)  . Type II diabetes mellitus [facility])     Patient Active Problem List   Diagnosis Date Noted  . Acute on chronic renal failure ([place]) 11/20/2017  . CAD (coronary artery disease) 11/20/2017  . Vasomotor rhinitis 10/16/2016  . Right facial numbness 03/05/2016  . CKD (chronic kidney disease), stage III ([place]) 03/05/2016  . Atrial fibrillation ([place]) 10/24/2015  . Chest pain with moderate risk of acute coronary syndrome 10/23/2015  . History of TIA (transient ischemic attack) 10/22/2015  . Complete heart block ([place]) 06/22/2015  . Allergic drug rash due to anti-infective agent 04/29/2015  . Fatigue 02/14/2015  . Chronic diastolic heart failure ([place]) 02/14/2015  . Cellulitis 12/21/2014  . S/P CABG x 5 11/29/2014  . CAD S/P percutaneous coronary angioplasty   .  Diarrhea 07/12/2014  . Generalized abdominal pain 07/12/2014  . Diastolic CHF, acute on chronic (HCC) 11/04/2013  . Mixed hypercholesterolemia and hypertriglyceridemia 04/22/2013  . Vertigo 04/22/2013  . Dyspnea on exertion 08/25/2012  . Allergic to IV contrast 07/23/2012  . Unstable angina ([place]) 07/22/2012  . Morbid (severe) obesity due to excess calories ([place]) 11/22/2011  . Nonischemic cardiomyopathy ([place]) 11/22/2011  . GERD (gastroesophageal reflux disease) 11/22/2011  . Back pain 11/22/2011  . OSA (obstructive sleep apnea)  11/22/2011  . HEMORRHOIDS-EXTERNAL 02/21/2010  . NAUSEA 02/21/2010  . ABDOMINAL PAIN -GENERALIZED 02/21/2010  . PERSONAL HX COLONIC POLYPS 02/21/2010  . ANEMIA 04/16/2007  . Essential hypertension 04/16/2007  . DIVERTICULOSIS, COLON 04/16/2007  . ARTHRITIS 04/16/2007  . INTERNAL HEMORRHOIDS WITHOUT MENTION COMP 04/12/2007  . Asthma 04/12/2007  . Cardiac resynchronization therapy pacemaker (CRT-P) in place 04/12/2007  . ACUTE GASTRITIS WITHOUT MENTION OF HEMORRHAGE 12/14/2006  . COLONIC POLYPS, HYPERPLASTIC 09/27/2002  . FATTY LIVER DISEASE 02/16/2002    Past Surgical History:  Procedure Laterality Date  . ABDOMINAL ULTRASOUND  12/01/2011   Peripelvic cysts- #1- 2.4x1.9x2.3cm, #2-1.2x0.9x1.2cm  . ANKLE FRACTURE SURGERY Right    "had rod put in"  . ANTERIOR CERVICAL DECOMP/DISCECTOMY FUSION    . APPENDECTOMY    . BACK SURGERY    . BIV ICD INSERTION CRT-D  2001?  [name] BIV PACEMAKER GENERATOR CHANGE OUT N/A 11/02/2012   Procedure: BIV PACEMAKER GENERATOR CHANGE OUT;  Surgeon: Sanda Klein, MD;  Location: [facility] CATH LAB;  Service: Cardiovascular;  Laterality: N/A;  . CARDIAC CATHETERIZATION  05/17/1999   No significant coronary obstructive disease w/ mild 20% luminal irregularity of the first diag branch of the LAD  . CARDIAC CATHETERIZATION  07/08/2002   No significant CAD, moderately depressed LV systolic function  . CARDIAC CATHETERIZATION Bilateral 04/26/2007   Normal findings, recommend medical therapy  . CARDIAC CATHETERIZATION  02/18/2008   Moderate CAD, would benefit from having a functional study, recommend continue medical therapy  . CARDIAC CATHETERIZATION  07/23/2012   Medical therapy  . CARDIAC CATHETERIZATION N/A 11/24/2014   Procedure: Left Heart Cath and Coronary Angiography;  Surgeon: Troy Sine, MD;  Location: [facility] CV LAB;  Service: Cardiovascular;  Laterality: N/A;  . CARDIAC CATHETERIZATION  11/27/2014   Procedure: Intravascular Pressure Wire/FFR Study;   Surgeon: Peter M Martinique, MD;  Location: [facility] CV LAB;  Service: Cardiovascular;;  . CARDIAC CATHETERIZATION  10/09/2016  . CORONARY ANGIOGRAM  2010  . CORONARY ARTERY BYPASS GRAFT N/A 11/29/2014   Procedure: CORONARY ARTERY BYPASS GRAFTING x 5 (LIMA-LAD, SVG-D, SVG-OM1-OM2, SVG-PD);  Surgeon: Melrose Nakayama, MD;  Location: [facility];  Service: Open Heart Surgery;  Laterality: N/A;  . CORONARY STENT INTERVENTION N/A 10/31/2016   Procedure: CORONARY STENT INTERVENTION;  Surgeon: Burnell Blanks, MD;  Location: [facility] CV LAB;  Service: Cardiovascular;  Laterality: N/A;  . FRACTURE SURGERY    . INSERT / REPLACE / [place]  . KNEE ARTHROSCOPY Bilateral   . LEFT HEART CATHETERIZATION WITH CORONARY ANGIOGRAM N/A 07/23/2012   Procedure: LEFT HEART CATHETERIZATION WITH CORONARY ANGIOGRAM;  Surgeon: Leonie Man, MD;  Location: [facility] CATH LAB;  Service: Cardiovascular;  Laterality: N/A;  Carlton Adam MYOVIEW  11/14/2011   Mild-moderate defect seen in Mid Inferolateral and Mid Anterolateral regions-consistant w/ infarct/scar. No significant ischemia demonstrated.  [name] RIGHT/LEFT HEART CATH AND CORONARY ANGIOGRAPHY N/A 10/09/2016   Procedure: RIGHT/LEFT HEART CATH AND CORONARY ANGIOGRAPHY;  Surgeon: Jolaine Artist, MD;  Location: [facility] CV LAB;  Service: Cardiovascular;  Laterality: N/A;  . TEE WITHOUT CARDIOVERSION N/A 11/29/2014   Procedure: TRANSESOPHAGEAL ECHOCARDIOGRAM (TEE);  Surgeon: Melrose Nakayama, MD;  Location: [facility];  Service: Open Heart Surgery;  Laterality: N/A;  . TRANSTHORACIC ECHOCARDIOGRAM  07/23/2012   EF 55-60%, normal-mild  . TUBAL LIGATION       OB History   None      Home Medications    Prior to Admission medications   Medication Sig Start Date End Date Taking? Authorizing Provider  acetaminophen (TYLENOL) 500 MG tablet Take 1,000 mg by mouth every 6 (six) hours as needed for mild pain.    Yes [provider]  albuterol  (PROVENTIL) (2.5 MG/3ML) 0.083% nebulizer solution Take 2.5 mg by nebulization every 6 (six) hours as needed for wheezing or shortness of breath.   Yes [provider]  aspirin EC 81 MG tablet Take 1 tablet (81 mg total) by mouth daily. 03/27/16  Yes Kilroy, Luke K, PA-C  budesonide-formoterol (SYMBICORT) 160-4.5 MCG/ACT inhaler Inhale 2 puffs into the lungs 2 (two) times daily.   Yes [provider]  cetirizine (ZYRTEC) 10 MG tablet Take 10 mg by mouth daily.  03/24/17  Yes [provider]  clopidogrel (PLAVIX) 75 MG tablet TAKE 1 TABLET EVERY DAY Patient taking differently: Take 75 mg by mouth daily.  09/08/17  Yes Croitoru, [name], MD  clotrimazole (LOTRIMIN) 1 % cream Apply 1 application topically 2 (two) times daily. Patient taking differently: Apply 1 application topically 2 (two) times daily as needed (rash).  07/20/17  Yes Lendon Colonel, NP  febuxostat (ULORIC) 40 MG tablet Take 40 mg by mouth daily. 11/10/17  Yes [provider]  rosuvastatin (CRESTOR) 20 MG tablet TAKE 1 TABLET EVERY DAY Patient taking differently: Take 20 mg by mouth at bedtime.  11/26/16  Yes Croitoru, [name], MD  spironolactone (ALDACTONE) 25 MG tablet TAKE 1 TABLET EVERY DAY Patient taking differently: Take 25 mg by mouth at bedtime.  11/26/16  Yes Croitoru, [name], MD  bisoprolol (ZEBETA) 5 MG tablet TAKE 1 TABLET EVERY DAY Patient not taking: Reported on 11/20/2017 11/10/17   Erlene Quan, PA-C  docusate sodium (COLACE) 100 MG capsule Take 200 mg by mouth daily as needed for mild constipation.     [provider]  EPINEPHrine (EPIPEN 2-PAK) 0.3 mg/0.3 mL IJ SOAJ injection Inject 0.3 mg into the muscle once as needed (severe allergic reaction).    [provider]  ezetimibe (ZETIA) 10 MG tablet Take 1 tablet (10 mg total) by mouth daily. 07/20/17 10/18/17  Lendon Colonel, NP  fluconazole (DIFLUCAN) 100 MG tablet Take 1 tablet (100 mg total) by mouth as directed.  Take 2 today and then one daily for 6 days. Patient not taking: Reported on 09/23/2017 07/21/17   Willia Craze, NP  fluticasone [facility]) 50 MCG/ACT nasal spray Place 1 spray into both nostrils at bedtime as needed for rhinitis or allergies.     [provider]  guaiFENesin (MUCINEX) 600 MG 12 hr tablet Take 600 mg by mouth at bedtime.     [provider]  hydroxypropyl methylcellulose / hypromellose (ISOPTO TEARS / GONIOVISC) 2.5 % ophthalmic solution Place 1 drop into both eyes 4 (four) times daily as needed for dry eyes.     [provider]  ipratropium (ATROVENT) 0.02 % nebulizer solution Take 2.5 mLs (0.5 mg total) by nebulization 4 (four) times daily. 09/23/17   Carmon Sails  J, PA-C  ipratropium (ATROVENT) 0.06 % nasal spray 1-2 pffs up to 4 x daily as needed Patient taking differently: Place 2 sprays into both nostrils 4 (four) times daily as needed for rhinitis.  10/16/16   Tanda Rockers, MD  isosorbide mononitrate (IMDUR) 60 MG 24 hr tablet Take 1 tablet (60 mg total) by mouth daily. 06/08/17   Croitoru, [name], MD  metolazone (ZAROXOLYN) 5 MG tablet Take 1 tablet (5 mg total) by mouth as needed. Take only as directed by [facility] Patient taking differently: Take 5 mg by mouth daily.  07/21/16   Eileen Stanford, PA-C  nitroGLYCERIN (NITROSTAT) 0.4 MG SL tablet Place 1 tablet (0.4 mg total) under the tongue every 5 (five) minutes as needed for chest pain. 11/13/16 09/23/17  Erlene Quan, PA-C  ondansetron (ZOFRAN ODT) 4 MG disintegrating tablet Take 1 tablet (4 mg total) by mouth every 8 (eight) hours as needed. Patient not taking: Reported on 09/23/2017 04/18/17   Isla Pence, MD  OXYGEN Inhale 2 L into the lungs See admin instructions. as needed for low oxygen levels & at bedtime each night.    [provider]  pantoprazole (PROTONIX) 40 MG tablet Take 1 tablet (40 mg total) by mouth daily. 12/10/16 12/10/17  Croitoru, [name], MD    potassium chloride SA (K-DUR,KLOR-CON) 20 MEQ tablet TAKE 1 TABLET EVERY DAY Patient taking differently: Take 20 mEq by mouth daily.  09/08/17   Croitoru, [name], MD  predniSONE (STERAPRED UNI-PAK 21 TAB) 10 MG (21) TBPK tablet Take by mouth daily. Take 6 tabs by mouth daily  for 2 days, then 5 tabs for 2 days, then 4 tabs for 2 days, then 3 tabs for 2 days, 2 tabs for 2 days, then 1 tab by mouth daily for 2 days Patient not taking: Reported on 11/20/2017 09/23/17   Kinnie Feil, PA-C  torsemide (DEMADEX) 20 MG tablet TAKE 40 MG (2TAB) EVERY OTHER DAY ALTERNATING WITH 60 MG EVERY OTHER DAY Patient taking differently: Take 40-60 mg by mouth See admin instructions. TAKE 40 MG (2TAB) EVERY OTHER DAY ALTERNATING WITH 60 MG (2 tabs in am, 2 tabs in pm) EVERY OTHER DAY 08/18/17   Bensimhon, Shaune Pascal, MD  triamcinolone cream (KENALOG) 0.1 % Apply 1 application topically 2 (two) times daily as needed (itching).  08/24/14   [provider]  VENTOLIN HFA 108 (90 Base) MCG/ACT inhaler INHALE 2 PUFFS EVERY 4 HOURS AS NEEDED FOR WHEEZING Patient taking differently: Inhale 2 puffs into the lungs every 4 (four) hours as needed for wheezing or shortness of breath.  07/10/16   Rigoberto Noel, MD    Family History Family History  Problem Relation Age of Onset  . Breast cancer Mother   . Diabetes Mother   . Heart disease Maternal Grandmother   . Kidney disease Maternal Grandmother   . Diabetes Maternal Grandmother   . Glaucoma Maternal Aunt   . Heart disease Maternal Aunt   . Asthma Sister     Social History Social History   Tobacco Use  . Smoking status: Former Smoker    Packs/day: 0.25    Years: 3.00    Pack years: 0.75    Types: Cigarettes    Last attempt to quit: 02/11/1968    Years since quitting: 49.8  . Smokeless tobacco: Never Used  Substance Use Topics  . Alcohol use: No  . Drug use: No     Allergies   Ivp dye [iodinated  diagnostic agents]; Shellfish allergy;  Shellfish-derived products; Sulfa antibiotics; Iodine; Atorvastatin; Doxycycline; Cephalexin; and Zithromax [azithromycin]   Review of Systems Review of Systems  Constitutional: Negative for chills and fever.  Respiratory: Negative for shortness of breath.   Cardiovascular: Negative for chest pain.  Gastrointestinal: Positive for abdominal pain, diarrhea and nausea. Negative for blood in stool, constipation and vomiting.  Genitourinary: Negative for dysuria, frequency, hematuria and urgency.  All other systems reviewed and are negative.    Physical Exam Updated Vital Signs BP 138/61   Pulse 72   Temp 98.2 F (36.8 C) (Oral)   Resp 17   Ht 5\' 5"  (1.651 m)   Wt 113.4 kg   SpO2 93%   BMI 41.60 kg/m   Physical Exam  Constitutional: She appears well-developed and well-nourished. No distress.  HENT:  Head: Normocephalic and atraumatic.  Eyes: Conjunctivae are normal. Right eye exhibits no discharge. Left eye exhibits no discharge.  Neck: No JVD present. No tracheal deviation present.  Cardiovascular: Normal rate and regular rhythm.  Pulmonary/Chest: Effort normal.  On 2L via [place] at baseline  Abdominal: Soft. Bowel sounds are normal. She exhibits no distension. There is tenderness in the epigastric area, periumbilical area, suprapubic area, left upper quadrant and left lower quadrant. There is guarding. There is no rigidity, no rebound, no CVA tenderness and negative Murphy's sign.  Musculoskeletal: She exhibits no edema.  No midline spine TTP, no paraspinal muscle tenderness, no deformity, crepitus, or step-off noted   Neurological: She is alert.  Skin: Skin is warm and dry. No erythema.  Psychiatric: She has a normal mood and affect. Her behavior is normal.  Nursing note and vitals reviewed.    ED Treatments / Results  Labs (all labs ordered are listed, but only abnormal results are displayed) Labs Reviewed  LIPASE, BLOOD - Abnormal; Notable for the following components:       Result Value   Lipase 65 (*)    All other components within normal limits  COMPREHENSIVE METABOLIC PANEL - Abnormal; Notable for the following components:   Potassium 3.4 (*)    Chloride 92 (*)    CO2 36 (*)    Glucose, Bld 132 (*)    BUN 67 (*)    Creatinine, Ser 1.61 (*)    GFR calc non Af Amer 31 (*)    GFR calc Af Amer 36 (*)    All other components within normal limits  CBC - Abnormal; Notable for the following components:   MCHC 29.5 (*)    All other components within normal limits  URINALYSIS, ROUTINE W REFLEX MICROSCOPIC - Abnormal; Notable for the following components:   Leukocytes, UA TRACE (*)    Bacteria, UA RARE (*)    All other components within normal limits    EKG None  Radiology Ct Abdomen Pelvis Wo Contrast  Result Date: 11/20/2017 CLINICAL DATA:  Abdominal pain and nausea. EXAM: CT ABDOMEN AND PELVIS WITHOUT CONTRAST TECHNIQUE: Multidetector CT imaging of the abdomen and pelvis was performed following the standard protocol without IV contrast. COMPARISON:  CT abdomen pelvis dated November 13, 2017. FINDINGS: Lower chest: No acute abnormality. Stable-3 mm pulmonary nodules in the right lower lobe. Hepatobiliary: No focal liver abnormality is seen. Unchanged small layering gallstones. No gallbladder wall thickening or biliary dilatation. Pancreas: Unremarkable. No pancreatic ductal dilatation or surrounding inflammatory changes. Spleen: Normal in size without focal abnormality. Adrenals/Urinary Tract: The adrenal glands are unremarkable. Unchanged mild bilateral renal atrophy. Unchanged small bilateral renal cysts.  No renal calculi or hydronephrosis. The bladder is unremarkable. Stomach/Bowel: Stomach is within normal limits. Appendix not visualized in this patient with prior history of appendectomy. No evidence of bowel wall thickening, distention, or inflammatory changes. Sigmoid diverticulosis. Vascular/Lymphatic: Aortic atherosclerosis. No enlarged abdominal or  pelvic lymph nodes. Reproductive: Uterus and bilateral adnexa are unremarkable. Other: No abdominal wall hernia or abnormality. No abdominopelvic ascites. No pneumoperitoneum. Musculoskeletal: No acute or significant osseous findings. Stable moderate to severe multilevel lumbar spondylosis and grade 1 anterolisthesis at L5-S1. IMPRESSION: 1.  No acute intra-abdominal process. 2. Cholelithiasis. 3.  Aortic atherosclerosis (ICD10-I70.0). Electronically Signed   By: Titus Dubin M.D.   On: 11/20/2017 20:19    Procedures Procedures (including critical care time)  Medications Ordered in ED Medications  sodium chloride 0.9 % bolus 500 mL (500 mLs Intravenous New Bag/Given 11/20/17 2036)  morphine 4 MG/ML injection 4 mg (4 mg Intravenous Given 11/20/17 2037)  ondansetron (ZOFRAN) injection 4 mg (4 mg Intravenous Given 11/20/17 2037)     Initial Impression / Assessment and Plan / ED Course  I have reviewed the triage vital signs and the nursing notes.  Pertinent labs & imaging results that were available during my care of the patient were reviewed by me and considered in my medical decision making (see chart for details).     Patient with ongoing and worsening abdominal pain with associated nausea and diarrhea.  She is afebrile, mildly hypotensive on initial evaluation, improved on reassessment.  She is not orthostatic.  Lab work significant for elevated lipase as compared to 3 days ago as well as AKI with elevated BUN and creatinine.  UA not consistent with UTI or nephrolithiasis.  Concern for possible CBD stone.  Repeat noncontrast CT of the abdomen and pelvis shows no acute intra-abdominal process but does show cholelithiasis.  However, with AK I feel she would benefit from admission to the hospital for further evaluation and management.  Suspect dehydration with concurrent Lasix use.  Fluid bolus given.  Spoke with Dr. Wynetta Emery with [facility] hospitalist service who agrees to assume care of patient  and bring her into the hospital for management.  Patient seen and evaluate Dr. Rogene Houston who agrees with assessment and plan at this time  Final Clinical Impressions(s) / ED Diagnoses   Final diagnoses:  AKI (acute kidney injury) [facility])    ED Discharge Orders    None       Renita Papa, PA-C 11/20/17 2210    Fredia Sorrow, MD 11/22/17 [phone]

## 2017-11-20 NOTE — ED Notes (Signed)
Patient transported to CT 

## 2017-11-20 NOTE — ED Provider Notes (Signed)
Medical screening examination/treatment/procedure(s) were conducted as a shared visit with non-physician practitioner(s) and myself.  I personally evaluated the patient during the encounter.  None   Patient seen October 4 had CT of abdomen at that time.  It was negative for any significant findings other than cholelithiasis.  Patient with persistent epigastric pain radiates to the back into the left upper quadrant.  Patient states the pain is been a bit worse.  Today's labs show a mild elevation in lipase which was normal before.  Liver function test without significant abnormalities.  No significant leukocytosis.  But since pain is worse and lipase is up it is possible she could have a pancreatic or common bile duct stone.  We will go ahead and repeat CT scan of abdomen.  Labs do show a significant worsening in her renal function with elevation in BUN and creatinine.  This is consistent with acute kidney injury.  Patient is on Lasix it could be responsible for but will require admission for this alone.  But CT abdomen results are important prior to disposition for admission.   Fredia Sorrow, MD 11/20/17 2018

## 2017-11-20 NOTE — H&P (Signed)
History and Physical    [name] EYC:[id number] DOB: 1945/09/22 DOA: 11/20/2017  PCP: Darcus Austin, MD  Patient coming from: Home  I have personally briefly reviewed patient's old medical records in [place]  Chief Complaint: abd pain  HPI: [name] is a 72 y.o. female with medical history significant of CHF, coronary artery disease, chronic respiratory failure, morbid obesity, presents with abdominal pain.  Patient was seen in emergency room on October 4 for abdominal pain.  Patient had imaging at that time that was nonacute and she was discharged home.  She went to her PCP on yesterday she was found to be slightly hypotensive.  She was referred to GI and  has an appointment on Monday.  Today in the ED her blood pressure was 97/68.  IV fluids her blood pressures in the 130s over 60s range.  Patient has had decreased p.o. intake with her abdominal pain on and off for the past few weeks.  Pain is located in the epigastric area that radiates around to her back.  She has intermittent constipation alternating with diarrhea on and off for the past few weeks as well.  Eating makes her abdominal pain worse.  Did not receive any relief taking Tylenol at home. she has lost weight over the past few weeks.  ED Course: Patient's creatinine was 1.6 her BUN was 67 prior was 30 and 1.2 . her range is between 1.2-1.4 she has chronic kidney disease.  Patient was given 500 mL's of IV fluids.  CT scan of her abdomen and pelvis non contrasted  was nonacute.  She received 4 mg of IV morphine which did improve her discomfort.  Review of Systems: Denies chest pain shortness of breath All others reviewed with patient  and are  negative unless otherwise stated    Past Medical History:  Diagnosis Date  . AICD (automatic cardioverter/defibrillator) present   . Anemia   . Arthritis   . Asthma   . CAD (coronary artery disease)    a. 10/09/16 LHC: SVG->LAD patent, SVG->Diag patent, SVG->RCA  patent, SVG->LCx occluded. EF 60%, b. 10/31/16 LHC DES to AV groove Circ, DES to intermed branch  . Cellulitis and abscess of foot 12/2014   RT FOOT  . CHF (congestive heart failure) ([place])   . Chronic bronchitis ([place])   . Chronic lower back pain   . Diverticulosis   . Facial numbness 02/2016  . Fatty liver disease, nonalcoholic   . Gastritis   . GERD (gastroesophageal reflux disease)   . Gout   . H/O hiatal hernia   . High cholesterol   . Hyperplastic colon polyp   . Hypertension   . IBS (irritable bowel syndrome)   . Internal hemorrhoids   . Ischemic cardiomyopathy   . Obesity   . On home oxygen therapy    "2L at night" (10/31/2016)  . OSA (obstructive sleep apnea)    "can't tolerate a mask" (10/30/2016)  . Pneumonia    "couple times" (10/31/2016)  . Shortness of breath   . TIA (transient ischemic attack)    "recently" (10/31/2016)  . Type II diabetes mellitus ([place])     Past Surgical History:  Procedure Laterality Date  . ABDOMINAL ULTRASOUND  12/01/2011   Peripelvic cysts- #1- 2.4x1.9x2.3cm, #2-1.2x0.9x1.2cm  . ANKLE FRACTURE SURGERY Right    "had rod put in"  . ANTERIOR CERVICAL DECOMP/DISCECTOMY FUSION    . APPENDECTOMY    . BACK SURGERY    . BIV ICD  INSERTION CRT-D  2001?  [name] BIV PACEMAKER GENERATOR CHANGE OUT N/A 11/02/2012   Procedure: BIV PACEMAKER GENERATOR CHANGE OUT;  Surgeon: Sanda Klein, MD;  Location: [facility];  Service: Cardiovascular;  Laterality: N/A;  . CARDIAC CATHETERIZATION  05/17/1999   No significant coronary obstructive disease w/ mild 20% luminal irregularity of the first diag branch of the LAD  . CARDIAC CATHETERIZATION  07/08/2002   No significant CAD, moderately depressed LV systolic function  . CARDIAC CATHETERIZATION Bilateral 04/26/2007   Normal findings, recommend medical therapy  . CARDIAC CATHETERIZATION  02/18/2008   Moderate CAD, would benefit from having a functional study, recommend continue medical therapy  . CARDIAC CATHETERIZATION   07/23/2012   Medical therapy  . CARDIAC CATHETERIZATION N/A 11/24/2014   Procedure: Left Heart Cath and Coronary Angiography;  Surgeon: Troy Sine, MD;  Location: [facility];  Service: Cardiovascular;  Laterality: N/A;  . CARDIAC CATHETERIZATION  11/27/2014   Procedure: Intravascular Pressure Wire/FFR Study;  Surgeon: Peter M Martinique, MD;  Location: [facility];  Service: Cardiovascular;;  . CARDIAC CATHETERIZATION  10/09/2016  . CORONARY ANGIOGRAM  2010  . CORONARY ARTERY BYPASS GRAFT N/A 11/29/2014   Procedure: CORONARY ARTERY BYPASS GRAFTING x 5 (LIMA-LAD, SVG-D, SVG-OM1-OM2, SVG-PD);  Surgeon: Melrose Nakayama, MD;  Location: [facility];  Service: Open Heart Surgery;  Laterality: N/A;  . CORONARY STENT INTERVENTION N/A 10/31/2016   Procedure: CORONARY STENT INTERVENTION;  Surgeon: Burnell Blanks, MD;  Location: [facility];  Service: Cardiovascular;  Laterality: N/A;  . FRACTURE SURGERY    . INSERT / REPLACE / [place]  . KNEE ARTHROSCOPY Bilateral   . LEFT HEART CATHETERIZATION WITH CORONARY ANGIOGRAM N/A 07/23/2012   Procedure: LEFT HEART CATHETERIZATION WITH CORONARY ANGIOGRAM;  Surgeon: Leonie Man, MD;  Location[facility];  Service: Cardiovascular;  Laterality: N/A;  Carlton Adam MYOVIEW  11/14/2011   Mild-moderate defect seen in Mid Inferolateral and Mid Anterolateral regions-consistant w/ infarct/scar. No significant ischemia demonstrated.  [name] RIGHT/LEFT HEART CATH AND CORONARY ANGIOGRAPHY N/A 10/09/2016   Procedure: RIGHT/LEFT HEART CATH AND CORONARY ANGIOGRAPHY;  Surgeon: Jolaine Artist, MD;  Location: [facility];  Service: Cardiovascular;  Laterality: N/A;  . TEE WITHOUT CARDIOVERSION N/A 11/29/2014   Procedure: TRANSESOPHAGEAL ECHOCARDIOGRAM (TEE);  Surgeon: Melrose Nakayama, MD;  Location: [facility];  Service: Open Heart Surgery;  Laterality: N/A;  . TRANSTHORACIC ECHOCARDIOGRAM  07/23/2012   EF 55-60%, normal-mild  .  TUBAL LIGATION       reports that she quit smoking about 49 years ago. Her smoking use included cigarettes. She has a 0.75 pack-year smoking history. She has never used smokeless tobacco. She reports that she does not drink alcohol or use drugs.  Allergies  Allergen Reactions  . Ivp Dye [Iodinated Diagnostic Agents] Shortness Of Breath    No reaction to PO contrast with non-ionic dye.06-25-2014/rsm  . Shellfish Allergy Anaphylaxis  . Shellfish-Derived Products Anaphylaxis  . Sulfa Antibiotics Shortness Of Breath  . Iodine Hives  . Atorvastatin Other (See Comments)    Pt states "causes bilateral leg pain/cramps."  . Doxycycline   . Cephalexin Itching and Rash  . Zithromax [Azithromycin] Rash    Family History  Problem Relation Age of Onset  . Breast cancer Mother   . Diabetes Mother   . Heart disease Maternal Grandmother   . Kidney disease Maternal Grandmother   . Diabetes Maternal Grandmother   . Glaucoma Maternal  Aunt   . Heart disease Maternal Aunt   . Asthma Sister      Prior to Admission medications   Medication Sig Start Date End Date Taking? Authorizing Provider  acetaminophen (TYLENOL) 500 MG tablet Take 1,000 mg by mouth every 6 (six) hours as needed for mild pain.    Yes [provider]  albuterol (PROVENTIL) (2.5 MG/3ML) 0.083% nebulizer solution Take 2.5 mg by nebulization every 6 (six) hours as needed for wheezing or shortness of breath.   Yes [provider]  aspirin EC 81 MG tablet Take 1 tablet (81 mg total) by mouth daily. 03/27/16  Yes Kilroy, Luke K, PA-C  budesonide-formoterol (SYMBICORT) 160-4.5 MCG/ACT inhaler Inhale 2 puffs into the lungs 2 (two) times daily.   Yes [provider]  cetirizine (ZYRTEC) 10 MG tablet Take 10 mg by mouth daily.  03/24/17  Yes [provider]  clopidogrel (PLAVIX) 75 MG tablet TAKE 1 TABLET EVERY DAY Patient taking differently: Take 75 mg by mouth daily.  09/08/17  Yes Croitoru, [name], MD    clotrimazole (LOTRIMIN) 1 % cream Apply 1 application topically 2 (two) times daily. Patient taking differently: Apply 1 application topically 2 (two) times daily as needed (rash).  07/20/17  Yes Lendon Colonel, NP  docusate sodium (COLACE) 100 MG capsule Take 200 mg by mouth daily as needed for mild constipation.    Yes [provider]  EPINEPHrine (EPIPEN 2-PAK) 0.3 mg/0.3 mL IJ SOAJ injection Inject 0.3 mg into the muscle once as needed (severe allergic reaction).   Yes [provider]  ezetimibe (ZETIA) 10 MG tablet Take 10 mg by mouth daily. 10/29/17  Yes [provider]  febuxostat (ULORIC) 40 MG tablet Take 40 mg by mouth daily. 11/10/17  Yes [provider]  fluticasone (FLONASE) 50 MCG/ACT nasal spray Place 1 spray into both nostrils at bedtime as needed for rhinitis or allergies.    Yes [provider]  guaiFENesin (MUCINEX) 600 MG 12 hr tablet Take 600 mg by mouth at bedtime.    Yes [provider]  hydroxypropyl methylcellulose / hypromellose (ISOPTO TEARS / GONIOVISC) 2.5 % ophthalmic solution Place 1 drop into both eyes 4 (four) times daily as needed for dry eyes.    Yes [provider]  ipratropium (ATROVENT) 0.02 % nebulizer solution Take 2.5 mLs (0.5 mg total) by nebulization 4 (four) times daily. 09/23/17  Yes Carmon Sails J, PA-C  ipratropium (ATROVENT) 0.06 % nasal spray 1-2 pffs up to 4 x daily as needed Patient taking differently: Place 2 sprays into both nostrils 4 (four) times daily as needed for rhinitis.  10/16/16  Yes Tanda Rockers, MD  isosorbide mononitrate (IMDUR) 60 MG 24 hr tablet Take 1 tablet (60 mg total) by mouth daily. 06/08/17  Yes Croitoru, [name], MD  metolazone (ZAROXOLYN) 5 MG tablet Take 1 tablet (5 mg total) by mouth as needed. Take only as directed by [facility] Patient taking differently: Take 5 mg by mouth daily.  07/21/16  Yes Eileen Stanford, PA-C  nitroGLYCERIN (NITROSTAT)  0.4 MG SL tablet Place 1 tablet (0.4 mg total) under the tongue every 5 (five) minutes as needed for chest pain. 11/13/16 11/20/17 Yes Kilroy, Luke K, PA-C  ondansetron (ZOFRAN ODT) 4 MG disintegrating tablet Take 1 tablet (4 mg total) by mouth every 8 (eight) hours as needed. 04/18/17  Yes Isla Pence, MD  pantoprazole (PROTONIX) 40 MG tablet Take 1 tablet (40 mg total) by mouth  daily. 12/10/16 12/10/17 Yes Croitoru, [name], MD  potassium chloride SA (K-DUR,KLOR-CON) 20 MEQ tablet TAKE 1 TABLET EVERY DAY Patient taking differently: Take 20 mEq by mouth daily.  09/08/17  Yes Croitoru, [name], MD  rosuvastatin (CRESTOR) 20 MG tablet TAKE 1 TABLET EVERY DAY Patient taking differently: Take 20 mg by mouth at bedtime.  11/26/16  Yes Croitoru, [name], MD  spironolactone (ALDACTONE) 25 MG tablet TAKE 1 TABLET EVERY DAY Patient taking differently: Take 25 mg by mouth at bedtime.  11/26/16  Yes Croitoru, [name], MD  torsemide (DEMADEX) 20 MG tablet TAKE 40 MG (2TAB) EVERY OTHER DAY ALTERNATING WITH 60 MG EVERY OTHER DAY Patient taking differently: Take 40-60 mg by mouth See admin instructions. TAKE 40 MG (2TAB) EVERY OTHER DAY ALTERNATING WITH 60 MG (2 tabs in am, 2 tabs in pm) EVERY OTHER DAY 08/18/17  Yes Bensimhon, Shaune Pascal, MD  triamcinolone cream (KENALOG) 0.1 % Apply 1 application topically 2 (two) times daily as needed (itching).  08/24/14  Yes [provider]  VENTOLIN HFA 108 (90 Base) MCG/ACT inhaler INHALE 2 PUFFS EVERY 4 HOURS AS NEEDED FOR WHEEZING Patient taking differently: Inhale 2 puffs into the lungs every 4 (four) hours as needed for wheezing or shortness of breath.  07/10/16  Yes Rigoberto Noel, MD  bisoprolol (ZEBETA) 5 MG tablet TAKE 1 TABLET EVERY DAY Patient not taking: Reported on 11/20/2017 11/10/17   Erlene Quan, PA-C  ezetimibe (ZETIA) 10 MG tablet Take 1 tablet (10 mg total) by mouth daily. 07/20/17 10/18/17  Lendon Colonel, NP  fluconazole (DIFLUCAN) 100 MG tablet Take 1  tablet (100 mg total) by mouth as directed. Take 2 today and then one daily for 6 days. Patient not taking: Reported on 09/23/2017 07/21/17   Willia Craze, NP  OXYGEN Inhale 2 L into the lungs See admin instructions. as needed for low oxygen levels & at bedtime each night.    [provider]  predniSONE (STERAPRED UNI-PAK 21 TAB) 10 MG (21) TBPK tablet Take by mouth daily. Take 6 tabs by mouth daily  for 2 days, then 5 tabs for 2 days, then 4 tabs for 2 days, then 3 tabs for 2 days, 2 tabs for 2 days, then 1 tab by mouth daily for 2 days Patient not taking: Reported on 11/20/2017 09/23/17   Kinnie Feil, PA-C    Physical Exam: Vitals:   11/20/17 1912 11/20/17 2115 11/20/17 2145 11/20/17 2243  BP: 133/62 (!) 133/111 138/61 (!) 106/59  Pulse: 65 66 72 71  Resp: 18 17  (!) 22  Temp:    98.2 F (36.8 C)  TempSrc:      SpO2: 97% 99% 93% 93%  Weight:      Height:        Constitutional: NAD, calm, comfortable Vitals:   11/20/17 1912 11/20/17 2115 11/20/17 2145 11/20/17 2243  BP: 133/62 (!) 133/111 138/61 (!) 106/59  Pulse: 65 66 72 71  Resp: 18 17  (!) 22  Temp:    98.2 F (36.8 C)  TempSrc:      SpO2: 97% 99% 93% 93%  Weight:      Height:       Eyes: PERRL, lids and conjunctivae normal ENMT: Mucous membranes are moist. Posterior pharynx clear of any exudate or lesions.Normal dentition.  Neck: normal, supple, no masses,  Respiratory: clear to auscultation bilaterally, no wheezing, no crackles. Normal respiratory effort. No accessory muscle use.  Cardiovascular: Regular rate and rhythm,  no murmurs / rubs / gallops. No extremity edema. 2+ pedal pulses.  Abdomen: no tenderness, no masses palpated. No hepatosplenomegaly. Bowel sounds positive.  Morbid obesity Musculoskeletal: no clubbing / cyanosis. No joint deformity upper and lower extremities. , no contractures. Normal muscle tone.  Skin: no rashes, lesions, ulcers. No induration Neurologic: CN 2-12 grossly intact.   Moves all extremities equally.  Psychiatric: Normal judgment and insight. Alert and oriented x 3. Normal mood.    Labs on Admission: I have personally reviewed following labs and imaging studies  CBC: Recent Labs  Lab 11/20/17 1706  WBC 8.2  HGB 12.5  HCT 42.4  MCV 93.2  PLT 947   Basic Metabolic Panel: Recent Labs  Lab 11/20/17 1706  NA 142  K 3.4*  CL 92*  CO2 36*  GLUCOSE 132*  BUN 67*  CREATININE 1.61*  CALCIUM 9.1   GFR: Estimated Creatinine Clearance: 39.1 mL/min (A) (by C-G formula based on SCr of 1.61 mg/dL (H)). Liver Function Tests: Recent Labs  Lab 11/20/17 1706  AST 19  ALT 20  ALKPHOS 85  BILITOT 0.8  PROT 6.9  ALBUMIN 3.5   Recent Labs  Lab 11/20/17 1706  LIPASE 65*   No results for input(s): AMMONIA in the last 168 hours. Coagulation Profile: No results for input(s): INR, PROTIME in the last 168 hours. Cardiac Enzymes: No results for input(s): CKTOTAL, CKMB, CKMBINDEX, TROPONINI in the last 168 hours. BNP (last 3 results) No results for input(s): PROBNP in the last 8760 hours. HbA1C: No results for input(s): HGBA1C in the last 72 hours. CBG: No results for input(s): GLUCAP in the last 168 hours. Lipid Profile: No results for input(s): CHOL, HDL, LDLCALC, TRIG, CHOLHDL, LDLDIRECT in the last 72 hours. Thyroid Function Tests: No results for input(s): TSH, T4TOTAL, FREET4, T3FREE, THYROIDAB in the last 72 hours. Anemia Panel: No results for input(s): VITAMINB12, FOLATE, FERRITIN, TIBC, IRON, RETICCTPCT in the last 72 hours. Urine analysis:    Component Value Date/Time   COLORURINE YELLOW 11/20/2017 1820   APPEARANCEUR CLEAR 11/20/2017 1820   LABSPEC 1.013 11/20/2017 1820   PHURINE 6.0 11/20/2017 1820   GLUCOSEU NEGATIVE 11/20/2017 1820   HGBUR NEGATIVE 11/20/2017 1820   BILIRUBINUR NEGATIVE 11/20/2017 1820   KETONESUR NEGATIVE 11/20/2017 1820   PROTEINUR NEGATIVE 11/20/2017 1820   UROBILINOGEN 0.2 12/06/2014 1531   NITRITE  NEGATIVE 11/20/2017 1820   LEUKOCYTESUR TRACE (A) 11/20/2017 1820    Radiological Exams on Admission: Ct Abdomen Pelvis Wo Contrast  Result Date: 11/20/2017 CLINICAL DATA:  Abdominal pain and nausea. EXAM: CT ABDOMEN AND PELVIS WITHOUT CONTRAST TECHNIQUE: Multidetector CT imaging of the abdomen and pelvis was performed following the standard protocol without IV contrast. COMPARISON:  CT abdomen pelvis dated November 13, 2017. FINDINGS: Lower chest: No acute abnormality. Stable-3 mm pulmonary nodules in the right lower lobe. Hepatobiliary: No focal liver abnormality is seen. Unchanged small layering gallstones. No gallbladder wall thickening or biliary dilatation. Pancreas: Unremarkable. No pancreatic ductal dilatation or surrounding inflammatory changes. Spleen: Normal in size without focal abnormality. Adrenals/Urinary Tract: The adrenal glands are unremarkable. Unchanged mild bilateral renal atrophy. Unchanged small bilateral renal cysts. No renal calculi or hydronephrosis. The bladder is unremarkable. Stomach/Bowel: Stomach is within normal limits. Appendix not visualized in this patient with prior history of appendectomy. No evidence of bowel wall thickening, distention, or inflammatory changes. Sigmoid diverticulosis. Vascular/Lymphatic: Aortic atherosclerosis. No enlarged abdominal or pelvic lymph nodes. Reproductive: Uterus and bilateral adnexa are unremarkable. Other: No abdominal wall hernia  or abnormality. No abdominopelvic ascites. No pneumoperitoneum. Musculoskeletal: No acute or significant osseous findings. Stable moderate to severe multilevel lumbar spondylosis and grade 1 anterolisthesis at L5-S1. IMPRESSION: 1.  No acute intra-abdominal process. 2. Cholelithiasis. 3.  Aortic atherosclerosis (ICD10-I70.0). Electronically Signed   By: Titus Dubin M.D.   On: 11/20/2017 20:19      Assessment/Plan Principal Problem:   Acute on chronic renal failure (HCC) Active Problems:   Essential  hypertension   OSA (obstructive sleep apnea)   Chronic diastolic heart failure (HCC)   CKD (chronic kidney disease), stage III (HCC)   CAD (coronary artery disease)   DM2 (diabetes mellitus, type 2) (HCC)   Epigastric abdominal pain    -Gentle IV fluids ,hold diuretics for now, repeat BMP in the a.m. suspect due to decreased p.o. intake and diuretic use. Ua benign -Continue Protonix.  Will get a HIDA scan.  Follow-up with GI on Monday as scheduled -Carb consistent diet, sliding scale insulin , this is a new diagnosis for patient in the past month.  -Continue home blood pressure medications with holding parameters -Continue home inhalers and oxygen supplementation -cont home Plavix, aspirin, beta-blocker for history of coronary artery disease, daily weights, follow ins and outs    DVT prophylaxis: lovenox  Code Status: Full  Disposition Plan: home tomorrow  Consults called: none  Admission status: Obs medical bed    Maxyne Derocher Stretch-Pitts MD [facility] Pager [phone]  If 7PM-7AM, please contact night-coverage [email or web address] Password [email or web address]  11/20/2017, 11:22 PM

## 2017-11-21 ENCOUNTER — Observation Stay (HOSPITAL_COMMUNITY): Payer: Medicare HMO

## 2017-11-21 ENCOUNTER — Other Ambulatory Visit: Payer: Self-pay

## 2017-11-21 DIAGNOSIS — E876 Hypokalemia: Secondary | ICD-10-CM | POA: Diagnosis not present

## 2017-11-21 DIAGNOSIS — I130 Hypertensive heart and chronic kidney disease with heart failure and stage 1 through stage 4 chronic kidney disease, or unspecified chronic kidney disease: Secondary | ICD-10-CM | POA: Diagnosis not present

## 2017-11-21 DIAGNOSIS — N183 Chronic kidney disease, stage 3 (moderate): Secondary | ICD-10-CM | POA: Diagnosis not present

## 2017-11-21 DIAGNOSIS — R1033 Periumbilical pain: Secondary | ICD-10-CM | POA: Diagnosis not present

## 2017-11-21 DIAGNOSIS — G4733 Obstructive sleep apnea (adult) (pediatric): Secondary | ICD-10-CM | POA: Diagnosis not present

## 2017-11-21 DIAGNOSIS — I5032 Chronic diastolic (congestive) heart failure: Secondary | ICD-10-CM | POA: Diagnosis not present

## 2017-11-21 DIAGNOSIS — K8020 Calculus of gallbladder without cholecystitis without obstruction: Secondary | ICD-10-CM | POA: Diagnosis not present

## 2017-11-21 DIAGNOSIS — N179 Acute kidney failure, unspecified: Secondary | ICD-10-CM | POA: Diagnosis not present

## 2017-11-21 DIAGNOSIS — R1013 Epigastric pain: Secondary | ICD-10-CM | POA: Diagnosis not present

## 2017-11-21 DIAGNOSIS — E1122 Type 2 diabetes mellitus with diabetic chronic kidney disease: Secondary | ICD-10-CM | POA: Diagnosis not present

## 2017-11-21 LAB — BASIC METABOLIC PANEL
Anion gap: 11 (ref 5–15)
BUN: 50 mg/dL — ABNORMAL HIGH (ref 8–23)
CO2: 33 mmol/L — ABNORMAL HIGH (ref 22–32)
Calcium: 8.7 mg/dL — ABNORMAL LOW (ref 8.9–10.3)
Chloride: 95 mmol/L — ABNORMAL LOW (ref 98–111)
Creatinine, Ser: 1.32 mg/dL — ABNORMAL HIGH (ref 0.44–1.00)
GFR calc Af Amer: 45 mL/min — ABNORMAL LOW (ref >60)
GFR calc non Af Amer: 39 mL/min — ABNORMAL LOW (ref >60)
Glucose, Bld: 121 mg/dL — ABNORMAL HIGH (ref 70–99)
Potassium: 3.4 mmol/L — ABNORMAL LOW (ref 3.5–5.1)
Sodium: 139 mmol/L (ref 135–145)

## 2017-11-21 LAB — GLUCOSE, CAPILLARY
Glucose-Capillary: 109 mg/dL — ABNORMAL HIGH (ref 70–99)
Glucose-Capillary: 147 mg/dL — ABNORMAL HIGH (ref 70–99)
Glucose-Capillary: 123 mg/dL — ABNORMAL HIGH (ref 70–99)
Glucose-Capillary: 159 mg/dL — ABNORMAL HIGH (ref 70–99)

## 2017-11-21 MED ORDER — IPRATROPIUM-ALBUTEROL 0.5-2.5 (3) MG/3ML IN SOLN
3.0000 mL | Freq: Two times a day (BID) | RESPIRATORY_TRACT | Status: DC
  Administered 2017-11-21 – 2017-11-22 (×3): 3 mL via RESPIRATORY_TRACT
  Filled 2017-11-21 (×2): qty 3

## 2017-11-21 MED ORDER — POTASSIUM CHLORIDE CRYS ER 20 MEQ PO TBCR
40.0000 meq | EXTENDED_RELEASE_TABLET | Freq: Once | ORAL | Status: AC
  Administered 2017-11-21: 40 meq via ORAL
  Filled 2017-11-21: qty 2

## 2017-11-21 NOTE — Progress Notes (Signed)
[name] stated,"I lost one of my earring down in the ED last night." One ear ring, red ruby in the middle surrounded around gold plated metal found in EDand returned to this patient.

## 2017-11-21 NOTE — Progress Notes (Addendum)
PROGRESS NOTE    [name]  ZES:[id number] DOB: 1945-07-25 DOA: 11/20/2017 PCP: Darcus Austin, MD   Brief Narrative: Patient is a 72 year old female with past medical history of CHF, coronary disease, chronic respiratory failure, morbid obesity who presented to the emergency department with complaints of abdominal pain.  Patient was complaining of pain on the epigastric area radiating towards the back.  CT done on presentation did not show any acute intra-abdominal abnormalities  but showed cholelithiasis.  HIDA scan has been ordered.  Assessment & Plan:   Principal Problem:   Epigastric abdominal pain Active Problems:   Essential hypertension   OSA (obstructive sleep apnea)   Chronic diastolic heart failure (HCC)   CKD (chronic kidney disease), stage III (HCC)   Acute on chronic renal failure (HCC)   CAD (coronary artery disease)   DM2 (diabetes mellitus, type 2) (HCC)  Abdominal pain: Unknown etiology.Complained of pain in the periumbilical region.  This morning her abdominal pain has improved.  CT abdomen did not show any acute intra-abdominal abnormalities.  Showed cholelithiasis.  HIDA scan has been ordered.  Patient is currently n.p.o. Patient has an appointment with GI on coming Monday as referred by her PCP. Continue PPI.  Hypertension: Currently normotensive.  Continue her home blood pressure medications  Chronic diastolic CHF: Currently euvolemic.  Patient was suspected to be dehydrated on presentation and was started on IV fluids.  Diuretics on hold.  OSA: Continue CPAP  AKI on CKD stage III: Baseline creatinine ranging from 1.2-1.4.  Presented with acute kidney injury which improved with IV fluids .Currently kidney function is on baseline  CAD: Denies any chest pain.  Continue her home medications.  CAD status post CABG in October 2016.  Patient also has defibrillator.  Diabetes type 2: Continue sliding scale insulin  Hypokalemia: We will supplement after HIDA  scan.    DVT prophylaxis: Lovenox Code Status: Full Family Communication: None present at the bedside Disposition Plan: Likely home after HIDA scan   Consultants: None  Procedures: None  Antimicrobials: None  Subjective: Patient seen and examined the bedside this morning.  Denies any abdominal pain during my evaluation.  HIDA scan has been ordered.  Wants to go home today.  Objective: Vitals:   11/20/17 2243 11/21/17 0503 11/21/17 0743 11/21/17 0747  BP: (!) 106/59 102/63    Pulse: 71 67    Resp: (!) 22 (!) 21    Temp: 98.2 F (36.8 C) 98.3 F (36.8 C)    TempSrc:  Oral    SpO2: 93% 91% 90% 90%  Weight:      Height:        Intake/Output Summary (Last 24 hours) at 11/21/2017 1307 Last data filed at 11/21/2017 0600 Gross per 24 hour  Intake 700 ml  Output -  Net 700 ml   Filed Weights   11/20/17 1647  Weight: 113.4 kg    Examination:  General exam: Appears calm and comfortable ,Not in distress,obese HEENT:PERRL,Oral mucosa moist, Ear/Nose normal on gross exam Respiratory system: Bilateral equal air entry, normal vesicular breath sounds, no wheezes or crackles  Cardiovascular system: S1 & S2 heard, RRR. No JVD, murmurs, rubs, gallops or clicks. No pedal edema. Gastrointestinal system: Abdomen is nondistended, soft .  Mild tenderness in the periumbilical region.. No organomegaly or masses felt. Normal bowel sounds heard. Central nervous system: Alert and oriented. No focal neurological deficits. Extremities: No edema, no clubbing ,no cyanosis, distal peripheral pulses palpable. Skin: No rashes, lesions or ulcers,no icterus ,  no pallor MSK: Normal muscle bulk,tone ,power Psychiatry: Judgement and insight appear normal. Mood & affect appropriate.     Data Reviewed: I have personally reviewed following labs and imaging studies  CBC: Recent Labs  Lab 11/20/17 1706  WBC 8.2  HGB 12.5  HCT 42.4  MCV 93.2  PLT 981   Basic Metabolic Panel: Recent Labs    Lab 11/20/17 1706 11/21/17 0626  NA 142 139  K 3.4* 3.4*  CL 92* 95*  CO2 36* 33*  GLUCOSE 132* 121*  BUN 67* 50*  CREATININE 1.61* 1.32*  CALCIUM 9.1 8.7*   GFR: Estimated Creatinine Clearance: 47.7 mL/min (A) (by C-G formula based on SCr of 1.32 mg/dL (H)). Liver Function Tests: Recent Labs  Lab 11/20/17 1706  AST 19  ALT 20  ALKPHOS 85  BILITOT 0.8  PROT 6.9  ALBUMIN 3.5   Recent Labs  Lab 11/20/17 1706  LIPASE 65*   No results for input(s): AMMONIA in the last 168 hours. Coagulation Profile: No results for input(s): INR, PROTIME in the last 168 hours. Cardiac Enzymes: No results for input(s): CKTOTAL, CKMB, CKMBINDEX, TROPONINI in the last 168 hours. BNP (last 3 results) No results for input(s): PROBNP in the last 8760 hours. HbA1C: No results for input(s): HGBA1C in the last 72 hours. CBG: Recent Labs  Lab 11/21/17 0752 11/21/17 1200  GLUCAP 109* 147*   Lipid Profile: No results for input(s): CHOL, HDL, LDLCALC, TRIG, CHOLHDL, LDLDIRECT in the last 72 hours. Thyroid Function Tests: No results for input(s): TSH, T4TOTAL, FREET4, T3FREE, THYROIDAB in the last 72 hours. Anemia Panel: No results for input(s): VITAMINB12, FOLATE, FERRITIN, TIBC, IRON, RETICCTPCT in the last 72 hours. Sepsis Labs: No results for input(s): PROCALCITON, LATICACIDVEN in the last 168 hours.  No results found for this or any previous visit (from the past 240 hour(s)).       Radiology Studies: Ct Abdomen Pelvis Wo Contrast  Result Date: 11/20/2017 CLINICAL DATA:  Abdominal pain and nausea. EXAM: CT ABDOMEN AND PELVIS WITHOUT CONTRAST TECHNIQUE: Multidetector CT imaging of the abdomen and pelvis was performed following the standard protocol without IV contrast. COMPARISON:  CT abdomen pelvis dated November 13, 2017. FINDINGS: Lower chest: No acute abnormality. Stable-3 mm pulmonary nodules in the right lower lobe. Hepatobiliary: No focal liver abnormality is seen. Unchanged  small layering gallstones. No gallbladder wall thickening or biliary dilatation. Pancreas: Unremarkable. No pancreatic ductal dilatation or surrounding inflammatory changes. Spleen: Normal in size without focal abnormality. Adrenals/Urinary Tract: The adrenal glands are unremarkable. Unchanged mild bilateral renal atrophy. Unchanged small bilateral renal cysts. No renal calculi or hydronephrosis. The bladder is unremarkable. Stomach/Bowel: Stomach is within normal limits. Appendix not visualized in this patient with prior history of appendectomy. No evidence of bowel wall thickening, distention, or inflammatory changes. Sigmoid diverticulosis. Vascular/Lymphatic: Aortic atherosclerosis. No enlarged abdominal or pelvic lymph nodes. Reproductive: Uterus and bilateral adnexa are unremarkable. Other: No abdominal wall hernia or abnormality. No abdominopelvic ascites. No pneumoperitoneum. Musculoskeletal: No acute or significant osseous findings. Stable moderate to severe multilevel lumbar spondylosis and grade 1 anterolisthesis at L5-S1. IMPRESSION: 1.  No acute intra-abdominal process. 2. Cholelithiasis. 3.  Aortic atherosclerosis (ICD10-I70.0). Electronically Signed   By: Titus Dubin M.D.   On: 11/20/2017 20:19        Scheduled Meds: . aspirin EC  81 mg Oral Daily  . bisoprolol  5 mg Oral Daily  . clopidogrel  75 mg Oral Daily  . enoxaparin (LOVENOX) injection  40 mg  Subcutaneous QHS  . ezetimibe  10 mg Oral Daily  . guaiFENesin  600 mg Oral QHS  . insulin aspart  0-15 Units Subcutaneous TID WC  . ipratropium-albuterol  3 mL Nebulization BID  . isosorbide mononitrate  60 mg Oral Daily  . loratadine  10 mg Oral Daily  . mometasone-formoterol  2 puff Inhalation BID  . pantoprazole  40 mg Oral Daily  . rosuvastatin  20 mg Oral QHS   Continuous Infusions: . sodium chloride 100 mL/hr at 11/21/17 0943     LOS: 0 days    Time spent: 35 mins.More than 50% of that time was spent in counseling  and/or coordination of care.      Shelly Coss, MD [facility] Pager [phone]  If 7PM-7AM, please contact night-coverage [email or web address] Password [email or web address] 11/21/2017, 1:07 PM

## 2017-11-22 DIAGNOSIS — R1013 Epigastric pain: Secondary | ICD-10-CM | POA: Diagnosis not present

## 2017-11-22 DIAGNOSIS — R1033 Periumbilical pain: Secondary | ICD-10-CM | POA: Diagnosis not present

## 2017-11-22 LAB — BASIC METABOLIC PANEL
Anion gap: 10 (ref 5–15)
BUN: 39 mg/dL — ABNORMAL HIGH (ref 8–23)
CO2: 29 mmol/L (ref 22–32)
Calcium: 8.6 mg/dL — ABNORMAL LOW (ref 8.9–10.3)
Chloride: 98 mmol/L (ref 98–111)
Creatinine, Ser: 1.14 mg/dL — ABNORMAL HIGH (ref 0.44–1.00)
GFR calc Af Amer: 54 mL/min — ABNORMAL LOW (ref >60)
GFR calc non Af Amer: 47 mL/min — ABNORMAL LOW (ref >60)
Glucose, Bld: 145 mg/dL — ABNORMAL HIGH (ref 70–99)
Potassium: 4 mmol/L (ref 3.5–5.1)
Sodium: 137 mmol/L (ref 135–145)

## 2017-11-22 LAB — GLUCOSE, CAPILLARY
Glucose-Capillary: 125 mg/dL — ABNORMAL HIGH (ref 70–99)
Glucose-Capillary: 198 mg/dL — ABNORMAL HIGH (ref 70–99)

## 2017-11-22 NOTE — Progress Notes (Signed)
Nutrition Brief Note  Patient identified on the Malnutrition Screening Tool (MST) Report  Per chart review, pt has had multiple visits with outpatient RDs for weight loss and diabetes management. Pt has desired weight loss. Currently consuming 75-100% of meals when on a diet.   Wt Readings from Last 15 Encounters:  11/22/17 114.3 kg  09/23/17 116.8 kg  09/04/17 114.8 kg  07/21/17 114.9 kg  07/20/17 113.4 kg  05/19/17 114.7 kg  03/11/17 117 kg  02/05/17 118.8 kg  12/30/16 115.6 kg  12/18/16 116.8 kg  11/13/16 115.6 kg  11/01/16 107 kg  10/16/16 118.4 kg  10/09/16 113.4 kg  09/24/16 114.8 kg    Body mass index is 41.93 kg/m. Patient meets criteria for morbid obesity based on current BMI.   Current diet order is Heart Healthy, patient is consuming approximately 75-100% of meals at this time. Labs and medications reviewed.   No nutrition interventions warranted at this time. If nutrition issues arise, please consult RD.   Clayton Bibles, MS, RD, [place] [facility] Pager: [phone] After Hours Pager: [phone]

## 2017-11-22 NOTE — Discharge Summary (Signed)
Physician Discharge Summary  [name] STM:[id number] DOB: December 14, 1945 DOA: 11/20/2017  PCP: Darcus Austin, MD  Admit date: 11/20/2017 Discharge date: 11/22/2017  Admitted From: Home Disposition:  Home  Discharge Condition:Stable CODE STATUS:FULL Diet recommendation: Heart Healthy  Brief/Interim Summary:   Patient is a 72 year old female with past medical history of CHF, coronary disease, chronic respiratory failure, morbid obesity who presented to the emergency department with complaints of abdominal pain.  Patient was complaining of pain on the epigastric area radiating towards the back.  CT done on presentation did not show any acute intra-abdominal abnormalities but showed cholelithiasis.  HIDA scan was ordered but patient could not tolerate the procedure.  Her abdominal pain at the meantime has resolved.  She has appointment with gastroenterology tomorrow as an outpatient.  Patient is stable for discharge to home today.  Following problems were addressed during hospitalization:   Abdominal pain: Unknown etiology.Complained of pain in the periumbilical region.  This morning her abdominal pain has improved.  CT abdomen did not show any acute intra-abdominal abnormalities.  Showed cholelithiasis.  HIDA scan was ordered but patient could not tolerate the procedure and had to be terminated .Patient has an appointment with GI tomorrow.Continue PPI.  Hypertension: Currently normotensive.  Continue her home blood pressure medications  Chronic diastolic CHF: Currently euvolemic.  Patient was suspected to be dehydrated on presentation and was started on IV fluids.  Diuretics will be resumed on her normal dose on discharge.  OSA: Continue CPAP  AKI on CKD stage III: Baseline creatinine ranging from 1.2-1.4.  Presented with acute kidney injury which improved with IV fluids .Currently kidney function is on baseline  CAD: Denies any chest pain.  Continue her home medications.  CAD  status post CABG in October 2016.  Patient also has defibrillator.  Diabetes type 2: Continue sliding scale insulin  Hypokalemia: Supplemented.   Discharge Diagnoses:  Principal Problem:   Epigastric abdominal pain Active Problems:   Essential hypertension   OSA (obstructive sleep apnea)   Chronic diastolic heart failure (HCC)   CKD (chronic kidney disease), stage III (HCC)   Acute on chronic renal failure (HCC)   CAD (coronary artery disease)   DM2 (diabetes mellitus, type 2) ([place])    Discharge Instructions  Discharge Instructions    Diet - low sodium heart healthy   Complete by:  As directed    Discharge instructions   Complete by:  As directed    1) Follow up with gastroenterology on your scheduled appointment .   Increase activity slowly   Complete by:  As directed      Allergies as of 11/22/2017      Reactions   Ivp Dye [iodinated Diagnostic Agents] Shortness Of Breath   No reaction to PO contrast with non-ionic dye.06-25-2014/rsm   Shellfish Allergy Anaphylaxis   Shellfish-derived Products Anaphylaxis   Sulfa Antibiotics Shortness Of Breath   Iodine Hives   Atorvastatin Other (See Comments)   Pt states "causes bilateral leg pain/cramps."   Doxycycline    Cephalexin Itching, Rash   Zithromax [azithromycin] Rash      Medication List    STOP taking these medications   fluconazole 100 MG tablet Commonly known as:  DIFLUCAN   predniSONE 10 MG (21) Tbpk tablet Commonly known as:  STERAPRED UNI-PAK 21 TAB     TAKE these medications   acetaminophen 500 MG tablet Commonly known as:  TYLENOL Take 1,000 mg by mouth every 6 (six) hours as needed for mild pain.  aspirin EC 81 MG tablet Take 1 tablet (81 mg total) by mouth daily.   bisoprolol 5 MG tablet Commonly known as:  ZEBETA TAKE 1 TABLET EVERY DAY   budesonide-formoterol 160-4.5 MCG/ACT inhaler Commonly known as:  SYMBICORT Inhale 2 puffs into the lungs 2 (two) times daily.   cetirizine 10 MG  tablet Commonly known as:  ZYRTEC Take 10 mg by mouth daily.   clopidogrel 75 MG tablet Commonly known as:  PLAVIX TAKE 1 TABLET EVERY DAY   clotrimazole 1 % cream Commonly known as:  LOTRIMIN Apply 1 application topically 2 (two) times daily. What changed:    when to take this  reasons to take this   docusate sodium 100 MG capsule Commonly known as:  COLACE Take 200 mg by mouth daily as needed for mild constipation.   EPIPEN 2-PAK 0.3 mg/0.3 mL Soaj injection Generic drug:  EPINEPHrine Inject 0.3 mg into the muscle once as needed (severe allergic reaction).   ezetimibe 10 MG tablet Commonly known as:  ZETIA Take 1 tablet (10 mg total) by mouth daily.   ezetimibe 10 MG tablet Commonly known as:  ZETIA Take 10 mg by mouth daily.   febuxostat 40 MG tablet Commonly known as:  ULORIC Take 40 mg by mouth daily.   fluticasone 50 MCG/ACT nasal spray Commonly known as:  FLONASE Place 1 spray into both nostrils at bedtime as needed for rhinitis or allergies.   hydroxypropyl methylcellulose / hypromellose 2.5 % ophthalmic solution Commonly known as:  ISOPTO TEARS / GONIOVISC Place 1 drop into both eyes 4 (four) times daily as needed for dry eyes.   ipratropium 0.02 % nebulizer solution Commonly known as:  ATROVENT Take 2.5 mLs (0.5 mg total) by nebulization 4 (four) times daily.   ipratropium 0.06 % nasal spray Commonly known as:  ATROVENT 1-2 pffs up to 4 x daily as needed What changed:    how much to take  how to take this  when to take this  reasons to take this  additional instructions   isosorbide mononitrate 60 MG 24 hr tablet Commonly known as:  IMDUR Take 1 tablet (60 mg total) by mouth daily.   metolazone 5 MG tablet Commonly known as:  ZAROXOLYN Take 1 tablet (5 mg total) by mouth as needed. Take only as directed by [facility] What changed:    when to take this  additional instructions   MUCINEX 600 MG 12 hr tablet Generic drug:   guaiFENesin Take 600 mg by mouth at bedtime.   nitroGLYCERIN 0.4 MG SL tablet Commonly known as:  NITROSTAT Place 1 tablet (0.4 mg total) under the tongue every 5 (five) minutes as needed for chest pain.   ondansetron 4 MG disintegrating tablet Commonly known as:  ZOFRAN-ODT Take 1 tablet (4 mg total) by mouth every 8 (eight) hours as needed.   OXYGEN Inhale 2 L into the lungs See admin instructions. as needed for low oxygen levels & at bedtime each night.   pantoprazole 40 MG tablet Commonly known as:  PROTONIX Take 1 tablet (40 mg total) by mouth daily.   potassium chloride SA 20 MEQ tablet Commonly known as:  K-DUR,KLOR-CON TAKE 1 TABLET EVERY DAY   rosuvastatin 20 MG tablet Commonly known as:  CRESTOR TAKE 1 TABLET EVERY DAY What changed:  when to take this   spironolactone 25 MG tablet Commonly known as:  ALDACTONE TAKE 1 TABLET EVERY DAY What changed:  when to take this  torsemide 20 MG tablet Commonly known as:  DEMADEX TAKE 40 MG (2TAB) EVERY OTHER DAY ALTERNATING WITH 60 MG EVERY OTHER DAY What changed:    how much to take  how to take this  when to take this  additional instructions   triamcinolone cream 0.1 % Commonly known as:  KENALOG Apply 1 application topically 2 (two) times daily as needed (itching).   albuterol (2.5 MG/3ML) 0.083% nebulizer solution Commonly known as:  PROVENTIL Take 2.5 mg by nebulization every 6 (six) hours as needed for wheezing or shortness of breath. What changed:  Another medication with the same name was changed. Make sure you understand how and when to take each.   VENTOLIN HFA 108 (90 Base) MCG/ACT inhaler Generic drug:  albuterol INHALE 2 PUFFS EVERY 4 HOURS AS NEEDED FOR WHEEZING What changed:    how to take this  reasons to take this  additional instructions      Follow-up Information    Darcus Austin, MD. Schedule an appointment as soon as possible for a visit in 1 week(s).   Specialty:  Family  Medicine Contact information: [place] [address] [phone]        Sanda Klein, MD .   Specialty:  Cardiology Contact information: [address] [place] [address] [phone]          Allergies  Allergen Reactions  . Ivp Dye [Iodinated Diagnostic Agents] Shortness Of Breath    No reaction to PO contrast with non-ionic dye.06-25-2014/rsm  . Shellfish Allergy Anaphylaxis  . Shellfish-Derived Products Anaphylaxis  . Sulfa Antibiotics Shortness Of Breath  . Iodine Hives  . Atorvastatin Other (See Comments)    Pt states "causes bilateral leg pain/cramps."  . Doxycycline   . Cephalexin Itching and Rash  . Zithromax [Azithromycin] Rash    Consultations: None  Procedures/Studies: Ct Abdomen Pelvis Wo Contrast  Result Date: 11/20/2017 CLINICAL DATA:  Abdominal pain and nausea. EXAM: CT ABDOMEN AND PELVIS WITHOUT CONTRAST TECHNIQUE: Multidetector CT imaging of the abdomen and pelvis was performed following the standard protocol without IV contrast. COMPARISON:  CT abdomen pelvis dated November 13, 2017. FINDINGS: Lower chest: No acute abnormality. Stable-3 mm pulmonary nodules in the right lower lobe. Hepatobiliary: No focal liver abnormality is seen. Unchanged small layering gallstones. No gallbladder wall thickening or biliary dilatation. Pancreas: Unremarkable. No pancreatic ductal dilatation or surrounding inflammatory changes. Spleen: Normal in size without focal abnormality. Adrenals/Urinary Tract: The adrenal glands are unremarkable. Unchanged mild bilateral renal atrophy. Unchanged small bilateral renal cysts. No renal calculi or hydronephrosis. The bladder is unremarkable. Stomach/Bowel: Stomach is within normal limits. Appendix not visualized in this patient with prior history of appendectomy. No evidence of bowel wall thickening, distention, or inflammatory changes. Sigmoid diverticulosis. Vascular/Lymphatic: Aortic  atherosclerosis. No enlarged abdominal or pelvic lymph nodes. Reproductive: Uterus and bilateral adnexa are unremarkable. Other: No abdominal wall hernia or abnormality. No abdominopelvic ascites. No pneumoperitoneum. Musculoskeletal: No acute or significant osseous findings. Stable moderate to severe multilevel lumbar spondylosis and grade 1 anterolisthesis at L5-S1. IMPRESSION: 1.  No acute intra-abdominal process. 2. Cholelithiasis. 3.  Aortic atherosclerosis (ICD10-I70.0). Electronically Signed   By: Titus Dubin M.D.   On: 11/20/2017 20:19   Ct Abdomen Pelvis Wo Contrast  Result Date: 11/13/2017 CLINICAL DATA:  72 y/o F; generalized abdominal pain and diarrhea for 3-4 days. EXAM: CT ABDOMEN AND PELVIS WITHOUT CONTRAST TECHNIQUE: Multidetector CT imaging of the abdomen and pelvis was performed following the  standard protocol without IV contrast. COMPARISON:  09/19/2015 CT abdomen and pelvis. FINDINGS: Lower chest: Median sternotomy, 3 lead AICD, mitral annular calcification, coronary artery calcific atherosclerosis. Stable 5 and 3 mm pulmonary nodules in the right lower lobe compatible with benign etiology. Hepatobiliary: No focal liver abnormality is seen. Cholelithiasis. No gallbladder wall thickening or biliary ductal dilatation. Pancreas: Unremarkable. No pancreatic ductal dilatation or surrounding inflammatory changes. Spleen: Normal in size without focal abnormality. Adrenals/Urinary Tract: Adrenal glands are unremarkable. 10 mm left kidney interpolar cyst. Otherwise kidneys are normal, without renal calculi, focal lesion, or hydronephrosis. Bladder is unremarkable. Stomach/Bowel: 21 mm diverticulum arising from third segment of duodenum is stable. Mild sigmoid diverticulosis without findings of acute diverticulitis. No bowel wall thickening or inflammatory changes identified. Vascular/Lymphatic: Aortic atherosclerosis. No enlarged abdominal or pelvic lymph nodes. Reproductive: Uterus and bilateral  adnexa are unremarkable. Other: No abdominal wall hernia or abnormality. No abdominopelvic ascites. Musculoskeletal: Advanced degenerative changes of the spine and grade 1 L4-5 anterolisthesis are stable. Multilevel spinal canal and foraminal stenosis. No acute osseous abnormality. IMPRESSION: 1. No acute process identified as explanation for abdominal pain. 2. Cholelithiasis. 3. Stable duodenal and sigmoid colon diverticulosis. 4. Aortic Atherosclerosis (ICD10-I70.0). Electronically Signed   By: Kristine Garbe M.D.   On: 11/13/2017 02:38       Subjective: Patient seen and examined the bedside this morning.  Remains comfortable.  Denies any abdominal pain.  Stable for discharge to home today.  She needs to follow-up with her GI appointment.  Discharge Exam: Vitals:   11/22/17 0856 11/22/17 0859  BP:    Pulse:    Resp:    Temp:    SpO2: 92% 92%   Vitals:   11/21/17 2029 11/22/17 0442 11/22/17 0856 11/22/17 0859  BP:  122/75    Pulse:  64    Resp:  18    Temp:  98.2 F (36.8 C)    TempSrc:  Oral    SpO2: 96% 100% 92% 92%  Weight:  114.3 kg    Height:        General: Pt is alert, awake, not in acute distress Cardiovascular: RRR, S1/S2 +, no rubs, no gallops Respiratory: CTA bilaterally, no wheezing, no rhonchi Abdominal: Soft, NT, ND, bowel sounds + Extremities: no edema, no cyanosis    The results of significant diagnostics from this hospitalization (including imaging, microbiology, ancillary and laboratory) are listed below for reference.     Microbiology: No results found for this or any previous visit (from the past 240 hour(s)).   Labs: BNP (last 3 results) Recent Labs    09/23/17 1010  BNP 400.8*   Basic Metabolic Panel: Recent Labs  Lab 11/20/17 1706 11/21/17 0626 11/22/17 0543  NA 142 139 137  K 3.4* 3.4* 4.0  CL 92* 95* 98  CO2 36* 33* 29  GLUCOSE 132* 121* 145*  BUN 67* 50* 39*  CREATININE 1.61* 1.32* 1.14*  CALCIUM 9.1 8.7* 8.6*    Liver Function Tests: Recent Labs  Lab 11/20/17 1706  AST 19  ALT 20  ALKPHOS 85  BILITOT 0.8  PROT 6.9  ALBUMIN 3.5   Recent Labs  Lab 11/20/17 1706  LIPASE 65*   No results for input(s): AMMONIA in the last 168 hours. CBC: Recent Labs  Lab 11/20/17 1706  WBC 8.2  HGB 12.5  HCT 42.4  MCV 93.2  PLT 176   Cardiac Enzymes: No results for input(s): CKTOTAL, CKMB, CKMBINDEX, TROPONINI in the last 168 hours. BNP: Invalid  input(s): POCBNP CBG: Recent Labs  Lab 11/21/17 0752 11/21/17 1200 11/21/17 1742 11/21/17 2029 11/22/17 0734  GLUCAP 109* 147* 123* 159* 125*   D-Dimer No results for input(s): DDIMER in the last 72 hours. Hgb A1c No results for input(s): HGBA1C in the last 72 hours. Lipid Profile No results for input(s): CHOL, HDL, LDLCALC, TRIG, CHOLHDL, LDLDIRECT in the last 72 hours. Thyroid function studies No results for input(s): TSH, T4TOTAL, T3FREE, THYROIDAB in the last 72 hours.  Invalid input(s): FREET3 Anemia work up No results for input(s): VITAMINB12, FOLATE, FERRITIN, TIBC, IRON, RETICCTPCT in the last 72 hours. Urinalysis    Component Value Date/Time   COLORURINE YELLOW 11/20/2017 1820   APPEARANCEUR CLEAR 11/20/2017 1820   LABSPEC 1.013 11/20/2017 1820   PHURINE 6.0 11/20/2017 1820   GLUCOSEU NEGATIVE 11/20/2017 1820   HGBUR NEGATIVE 11/20/2017 1820   BILIRUBINUR NEGATIVE 11/20/2017 1820   KETONESUR NEGATIVE 11/20/2017 1820   PROTEINUR NEGATIVE 11/20/2017 1820   UROBILINOGEN 0.2 12/06/2014 1531   NITRITE NEGATIVE 11/20/2017 1820   LEUKOCYTESUR TRACE (A) 11/20/2017 1820   Sepsis Labs Invalid input(s): PROCALCITONIN,  WBC,  LACTICIDVEN Microbiology No results found for this or any previous visit (from the past 240 hour(s)).  Please note: You were cared for by a hospitalist during your hospital stay. Once you are discharged, your primary care physician will handle any further medical issues. Please note that NO REFILLS for any  discharge medications will be authorized once you are discharged, as it is imperative that you return to your primary care physician (or establish a relationship with a primary care physician if you do not have one) for your post hospital discharge needs so that they can reassess your need for medications and monitor your lab values.    Time coordinating discharge: 40 minutes  SIGNED:   Shelly Coss, MD  [facility] 11/22/2017, 10:49 AM Pager [phone]  If 7PM-7AM, please contact night-coverage [email or web address] Password [email or web address]

## 2017-11-23 ENCOUNTER — Telehealth: Payer: Self-pay | Admitting: *Deleted

## 2017-11-23 ENCOUNTER — Encounter

## 2017-11-23 ENCOUNTER — Encounter: Payer: Self-pay | Admitting: Gastroenterology

## 2017-11-23 ENCOUNTER — Ambulatory Visit (INDEPENDENT_AMBULATORY_CARE_PROVIDER_SITE_OTHER): Payer: Medicare HMO | Admitting: Gastroenterology

## 2017-11-23 VITALS — BP 102/70 | HR 76

## 2017-11-23 DIAGNOSIS — R195 Other fecal abnormalities: Secondary | ICD-10-CM

## 2017-11-23 DIAGNOSIS — R1310 Dysphagia, unspecified: Secondary | ICD-10-CM

## 2017-11-23 DIAGNOSIS — Z7902 Long term (current) use of antithrombotics/antiplatelets: Secondary | ICD-10-CM

## 2017-11-23 DIAGNOSIS — Z9981 Dependence on supplemental oxygen: Secondary | ICD-10-CM | POA: Diagnosis not present

## 2017-11-23 MED ORDER — NA SULFATE-K SULFATE-MG SULF 17.5-3.13-1.6 GM/177ML PO SOLN: 1.0000 | Freq: Once | ORAL | 0 refills | Status: AC

## 2017-11-23 NOTE — Patient Instructions (Signed)
You will be contacted by our office prior to your procedure for directions on holding your Plavix.  If you do not hear from our office 1 week prior to your scheduled procedure, please call [phone] to discuss.   You have been scheduled at [facility] on 12/29/2017 at 8:30am, separate instructions have been given  If you are age 73 or older, your body mass index should be between 23-30. Your There is no height or weight on file to calculate BMI. If this is out of the aforementioned range listed, please consider follow up with your Primary Care Provider.  If you are age 41 or younger, your body mass index should be between 19-25. Your There is no height or weight on file to calculate BMI. If this is out of the aformentioned range listed, please consider follow up with your Primary Care Provider.    Thank you for choosing [facility]  Karleen Hampshire Nandigam,MD

## 2017-11-23 NOTE — Progress Notes (Signed)
[name]    [id number]    [birth year]  Primary Care Physician:Gates, Butch Penny, MD  Referring Physician: Darcus Austin, MD [address] [place],  [address]  Chief complaint:  Positive Cologaurd, dysphagia  HPI:  72 year old female with multiple comorbidities including morbid obesity, CAD status post PCI September 2018, CHF, OSA, diabetes, hypertension, on continuous home oxygen here to discuss colonoscopy for further evaluation of positive cologaurd. She was last seen by [facility] July 21, 2017 She continues to have persistent solid food dysphagia, no improvement after empirical therapy with Diflucan. Denies weight loss or loss of appetite.  Heartburn and reflux symptoms are stable on PPI Denies any abdominal pain, melena or blood per rectum.   EGD December 14, 2006 showed mild antral gastritis otherwise normal exam.  H. pylori CLOtest negative.  Colonoscopy September 27, 2002 by Dr. Olevia Perches with removal of small diminutive hyperplastic polyps and left-sided diverticulosis   Outpatient Encounter Medications as of 11/23/2017  Medication Sig  . acetaminophen (TYLENOL) 500 MG tablet Take 1,000 mg by mouth every 6 (six) hours as needed for mild pain.   [name] albuterol (PROVENTIL) (2.5 MG/3ML) 0.083% nebulizer solution Take 2.5 mg by nebulization every 6 (six) hours as needed for wheezing or shortness of breath.  [name] aspirin EC 81 MG tablet Take 1 tablet (81 mg total) by mouth daily.  . bisoprolol (ZEBETA) 5 MG tablet TAKE 1 TABLET EVERY DAY  . budesonide-formoterol (SYMBICORT) 160-4.5 MCG/ACT inhaler Inhale 2 puffs into the lungs 2 (two) times daily.  . cetirizine (ZYRTEC) 10 MG tablet Take 10 mg by mouth daily.   . clopidogrel (PLAVIX) 75 MG tablet TAKE 1 TABLET EVERY DAY (Patient taking differently: Take 75 mg by mouth daily. )  . clotrimazole (LOTRIMIN) 1 % cream Apply 1 application topically 2 (two) times daily. (Patient taking differently: Apply 1  application topically 2 (two) times daily as needed (rash). )  . docusate sodium (COLACE) 100 MG capsule Take 200 mg by mouth daily as needed for mild constipation.   [name] EPINEPHrine (EPIPEN 2-PAK) 0.3 mg/0.3 mL IJ SOAJ injection Inject 0.3 mg into the muscle once as needed (severe allergic reaction).  . ezetimibe (ZETIA) 10 MG tablet Take 10 mg by mouth daily.  . febuxostat (ULORIC) 40 MG tablet Take 40 mg by mouth daily.  . fluticasone (FLONASE) 50 MCG/ACT nasal spray Place 1 spray into both nostrils at bedtime as needed for rhinitis or allergies.   [name] guaiFENesin (MUCINEX) 600 MG 12 hr tablet Take 600 mg by mouth at bedtime.   . hydroxypropyl methylcellulose / hypromellose (ISOPTO TEARS / GONIOVISC) 2.5 % ophthalmic solution Place 1 drop into both eyes 4 (four) times daily as needed for dry eyes.   [name] ipratropium (ATROVENT) 0.02 % nebulizer solution Take 2.5 mLs (0.5 mg total) by nebulization 4 (four) times daily.  [name] ipratropium (ATROVENT) 0.06 % nasal spray 1-2 pffs up to 4 x daily as needed (Patient taking differently: Place 2 sprays into both nostrils 4 (four) times daily as needed for rhinitis. )  . isosorbide mononitrate (IMDUR) 60 MG 24 hr tablet Take 1 tablet (60 mg total) by mouth daily.  . metolazone (ZAROXOLYN) 5 MG tablet Take 1 tablet (5 mg total) by mouth as needed. Take only as directed by [facility] (Patient taking differently: Take 5 mg by mouth daily. )  . ondansetron (ZOFRAN ODT) 4 MG disintegrating tablet Take 1 tablet (4  mg total) by mouth every 8 (eight) hours as needed.  . OXYGEN Inhale 2 L into the lungs See admin instructions. as needed for low oxygen levels & at bedtime each night.  . pantoprazole (PROTONIX) 40 MG tablet Take 1 tablet (40 mg total) by mouth daily.  . potassium chloride SA (K-DUR,KLOR-CON) 20 MEQ tablet TAKE 1 TABLET EVERY DAY (Patient taking differently: Take 20 mEq by mouth daily. )  . rosuvastatin (CRESTOR) 20 MG tablet TAKE 1 TABLET EVERY DAY  (Patient taking differently: Take 20 mg by mouth at bedtime. )  . spironolactone (ALDACTONE) 25 MG tablet TAKE 1 TABLET EVERY DAY (Patient taking differently: Take 25 mg by mouth at bedtime. )  . torsemide (DEMADEX) 20 MG tablet TAKE 40 MG (2TAB) EVERY OTHER DAY ALTERNATING WITH 60 MG EVERY OTHER DAY (Patient taking differently: Take 40-60 mg by mouth See admin instructions. TAKE 40 MG (2TAB) EVERY OTHER DAY ALTERNATING WITH 60 MG (2 tabs in am, 2 tabs in pm) EVERY OTHER DAY)  . triamcinolone cream (KENALOG) 0.1 % Apply 1 application topically 2 (two) times daily as needed (itching).   . VENTOLIN HFA 108 (90 Base) MCG/ACT inhaler INHALE 2 PUFFS EVERY 4 HOURS AS NEEDED FOR WHEEZING (Patient taking differently: Inhale 2 puffs into the lungs every 4 (four) hours as needed for wheezing or shortness of breath. )  . ezetimibe (ZETIA) 10 MG tablet Take 1 tablet (10 mg total) by mouth daily.  . nitroGLYCERIN (NITROSTAT) 0.4 MG SL tablet Place 1 tablet (0.4 mg total) under the tongue every 5 (five) minutes as needed for chest pain.   No facility-administered encounter medications on file as of 11/23/2017.     Allergies as of 11/23/2017 - Review Complete 11/23/2017  Allergen Reaction Noted  . Ivp dye [iodinated diagnostic agents] Shortness Of Breath 10/13/2010  . Shellfish allergy Anaphylaxis 11/20/2017  . Shellfish-derived products Anaphylaxis 10/13/2010  . Sulfa antibiotics Shortness Of Breath   . Iodine Hives 10/13/2010  . Atorvastatin Other (See Comments) 11/12/2015  . Doxycycline  04/18/2017  . Cephalexin Itching and Rash 04/30/2015  . Zithromax [azithromycin] Rash 06/25/2014    Past Medical History:  Diagnosis Date  . AICD (automatic cardioverter/defibrillator) present   . Anemia   . Arthritis   . Asthma   . CAD (coronary artery disease)    a. 10/09/16 LHC: SVG->LAD patent, SVG->Diag patent, SVG->RCA patent, SVG->LCx occluded. EF 60%, b. 10/31/16 LHC DES to AV groove Circ, DES to intermed  branch  . Cellulitis and abscess of foot 12/2014   RT FOOT  . CHF (congestive heart failure) ([place])   . Chronic bronchitis ([place])   . Chronic lower back pain   . Diverticulosis   . Facial numbness 02/2016  . Fatty liver disease, nonalcoholic   . Gastritis   . GERD (gastroesophageal reflux disease)   . Gout   . H/O hiatal hernia   . High cholesterol   . Hyperplastic colon polyp   . Hypertension   . IBS (irritable bowel syndrome)   . Internal hemorrhoids   . Ischemic cardiomyopathy   . Obesity   . On home oxygen therapy    "2L at night" (10/31/2016)  . OSA (obstructive sleep apnea)    "can't tolerate a mask" (10/30/2016)  . Pneumonia    "couple times" (10/31/2016)  . Shortness of breath   . TIA (transient ischemic attack)    "recently" (10/31/2016)  . Type II diabetes mellitus ([place])     Past Surgical History:  Procedure Laterality Date  . ABDOMINAL ULTRASOUND  12/01/2011   Peripelvic cysts- #1- 2.4x1.9x2.3cm, #2-1.2x0.9x1.2cm  . ANKLE FRACTURE SURGERY Right    "had rod put in"  . ANTERIOR CERVICAL DECOMP/DISCECTOMY FUSION    . APPENDECTOMY    . BACK SURGERY    . BIV ICD INSERTION CRT-D  2001?  [name] BIV PACEMAKER GENERATOR CHANGE OUT N/A 11/02/2012   Procedure: BIV PACEMAKER GENERATOR CHANGE OUT;  Surgeon: Sanda Klein, MD;  Location: [facility];  Service: Cardiovascular;  Laterality: N/A;  . CARDIAC CATHETERIZATION  05/17/1999   No significant coronary obstructive disease w/ mild 20% luminal irregularity of the first diag branch of the LAD  . CARDIAC CATHETERIZATION  07/08/2002   No significant CAD, moderately depressed LV systolic function  . CARDIAC CATHETERIZATION Bilateral 04/26/2007   Normal findings, recommend medical therapy  . CARDIAC CATHETERIZATION  02/18/2008   Moderate CAD, would benefit from having a functional study, recommend continue medical therapy  . CARDIAC CATHETERIZATION  07/23/2012   Medical therapy  . CARDIAC CATHETERIZATION N/A 11/24/2014   Procedure:  Left Heart Cath and Coronary Angiography;  Surgeon: Troy Sine, MD;  Location: [facility];  Service: Cardiovascular;  Laterality: N/A;  . CARDIAC CATHETERIZATION  11/27/2014   Procedure: Intravascular Pressure Wire/FFR Study;  Surgeon: Peter M Martinique, MD;  Location: [facility];  Service: Cardiovascular;;  . CARDIAC CATHETERIZATION  10/09/2016  . CORONARY ANGIOGRAM  2010  . CORONARY ARTERY BYPASS GRAFT N/A 11/29/2014   Procedure: CORONARY ARTERY BYPASS GRAFTING x 5 (LIMA-LAD, SVG-D, SVG-OM1-OM2, SVG-PD);  Surgeon: Melrose Nakayama, MD;  Location: [facility];  Service: Open Heart Surgery;  Laterality: N/A;  . CORONARY STENT INTERVENTION N/A 10/31/2016   Procedure: CORONARY STENT INTERVENTION;  Surgeon: Burnell Blanks, MD;  Location: [facility];  Service: Cardiovascular;  Laterality: N/A;  . FRACTURE SURGERY    . INSERT / REPLACE / [place]  . KNEE ARTHROSCOPY Bilateral   . LEFT HEART CATHETERIZATION WITH CORONARY ANGIOGRAM N/A 07/23/2012   Procedure: LEFT HEART CATHETERIZATION WITH CORONARY ANGIOGRAM;  Surgeon: Leonie Man, MD;  Location: [facility] C[facility];  Service: Cardiovascular;  Laterality: N/A;  Carlton Adam MYOVIEW  11/14/2011   Mild-moderate defect seen in Mid Inferolateral and Mid Anterolateral regions-consistant w/ infarct/scar. No significant ischemia demonstrated.  [name] RIGHT/LEFT HEART CATH AND CORONARY ANGIOGRAPHY N/A 10/09/2016   Procedure: RIGHT/LEFT HEART CATH AND CORONARY ANGIOGRAPHY;  Surgeon: Jolaine Artist, MD;  Location: [facility];  Service: Cardiovascular;  Laterality: N/A;  . TEE WITHOUT CARDIOVERSION N/A 11/29/2014   Procedure: TRANSESOPHAGEAL ECHOCARDIOGRAM (TEE);  Surgeon: Melrose Nakayama, MD;  Location: [facility];  Service: Open Heart Surgery;  Laterality: N/A;  . TRANSTHORACIC ECHOCARDIOGRAM  07/23/2012   EF 55-60%, normal-mild  . TUBAL LIGATION      Family History  Problem Relation Age of Onset  . Breast  cancer Mother   . Diabetes Mother   . Heart disease Maternal Grandmother   . Kidney disease Maternal Grandmother   . Diabetes Maternal Grandmother   . Glaucoma Maternal Aunt   . Heart disease Maternal Aunt   . Asthma Sister     Social History   Socioeconomic History  . Marital status: Widowed    Spouse name: Not on file  . Number of children: 5  . Years of education: Not on file  . Highest education level: Not on file  Occupational History  . Occupation: STAFF/BUFFET  Employer: [employer]  Social Needs  . Financial resource strain: Very hard  . Food insecurity:    Worry: Often true    Inability: Often true  . Transportation needs:    Medical: Not on file    Non-medical: Not on file  Tobacco Use  . Smoking status: Former Smoker    Packs/day: 0.25    Years: 3.00    Pack years: 0.75    Types: Cigarettes    Last attempt to quit: 02/11/1968    Years since quitting: 49.8  . Smokeless tobacco: Never Used  Substance and Sexual Activity  . Alcohol use: No  . Drug use: No  . Sexual activity: Never  Lifestyle  . Physical activity:    Days per week: 0 days    Minutes per session: 0 min  . Stress: Very much  Relationships  . Social connections:    Talks on phone: Not on file    Gets together: Not on file    Attends religious service: Not on file    Active member of club or organization: Not on file    Attends meetings of clubs or organizations: Not on file    Relationship status: Not on file  . Intimate partner violence:    Fear of current or ex partner: Not on file    Emotionally abused: Not on file    Physically abused: Not on file    Forced sexual activity: Not on file  Other Topics Concern  . Not on file  Social History Narrative   Widowed last year. She lives with her two sons.      Review of systems: Review of Systems  Constitutional: Negative for fever and chills.  Positive for weight loss loss of appetite HENT: Positive for sinus  problem Eyes: Negative for blurred vision.  Respiratory: Positive for cough, shortness of breath and wheezing.   Cardiovascular: Negative for chest pain and palpitations.  Gastrointestinal: as per HPI Genitourinary: Negative for dysuria, urgency, frequency and hematuria.  Musculoskeletal: Negative for myalgias, back pain and joint pain.  Skin: Negative for itching and rash.  Neurological: Negative for dizziness, tremors, focal weakness, seizures and loss of consciousness.  Positive for problem with walking and balance Endo/Heme/Allergies: Positive for seasonal allergies.  Psychiatric/Behavioral: Negative for depression, suicidal ideas and hallucinations.  All other systems reviewed and are negative.   Physical Exam: Vitals:   11/23/17 1434  BP: 102/70  Pulse: 76   There is no height or weight on file to calculate BMI. Gen:      No acute distress, morbidly obese in wheelchair on oxygen HEENT:  EOMI, sclera anicteric Neck:     No masses; no thyromegaly Lungs:    Clear to auscultation bilaterally; normal respiratory effort CV:         Regular rate and rhythm; no murmurs Abd:      + bowel sounds; soft, non-tender; no palpable masses, no distension Ext:    + edema;  Neuro: alert and oriented x 3 Psych: normal mood and affect  Data Reviewed:  Reviewed labs, radiology imaging, old records and pertinent past GI work up   Assessment and Plan/Recommendations:  72 year old female with morbid obesity, multiple medical problems including OSA, CAD status post PCI September 2018 on chronic antiplatelet therapy, CHF on continuous home oxygen  Positive cologaurd.  Last colonoscopy in 2004 with removal of hyperplastic polyps Will schedule for colonoscopy to follow-up and exclude neoplastic lesions  Persistent solid food dysphagia Schedule for EGD  along with colonoscopy to evaluate and possible esophageal dilation if needed   Will discuss with cardiology if okay to hold Plavix for 5 days  prior to the procedure We will schedule the procedures at [facility] endoscopy unit at hospital due to multiple medical problems and increased risk for procedure and anesthesia related complications  The risks and benefits as well as alternatives of endoscopic procedure(s) have been discussed and reviewed. All questions answered. The patient agrees to proceed.  Damaris Hippo , MD [phone]    CC: Darcus Austin, MD

## 2017-11-23 NOTE — Telephone Encounter (Signed)
[facility] Pre-operative Risk Assessment     Request for surgical clearance:     Endoscopy Procedure  What type of surgery is being performed?     Colonoscopy Endoscopy  When is this surgery scheduled?     12/29/2017  What type of clearance is required ?   Pharmacy  Are there any medications that need to be held prior to surgery and how long? Plavix  Practice name and name of physician performing surgery?      [facility]  What is your office phone and fax number?      Phone- [phone]  [phone]  Anesthesia type (None, local, MAC, general) ?       MAC

## 2017-11-24 ENCOUNTER — Other Ambulatory Visit: Payer: Self-pay | Admitting: Cardiovascular Disease

## 2017-11-24 ENCOUNTER — Ambulatory Visit: Payer: Medicare HMO | Admitting: Physician Assistant

## 2017-11-25 ENCOUNTER — Encounter: Payer: Self-pay | Admitting: Cardiology

## 2017-11-26 ENCOUNTER — Encounter: Payer: Self-pay | Admitting: Gastroenterology

## 2017-11-26 NOTE — Telephone Encounter (Signed)
   Primary Cardiologist: Sanda Klein, MD  Chart reviewed as part of pre-operative protocol coverage.  Will review pre op during office visit next week.       [place], [place] 11/26/2017, 3:11 PM

## 2017-11-27 DIAGNOSIS — H04123 Dry eye syndrome of bilateral lacrimal glands: Secondary | ICD-10-CM | POA: Diagnosis not present

## 2017-11-27 DIAGNOSIS — E119 Type 2 diabetes mellitus without complications: Secondary | ICD-10-CM | POA: Diagnosis not present

## 2017-11-29 DIAGNOSIS — J9611 Chronic respiratory failure with hypoxia: Secondary | ICD-10-CM | POA: Diagnosis not present

## 2017-11-29 DIAGNOSIS — J4520 Mild intermittent asthma, uncomplicated: Secondary | ICD-10-CM | POA: Diagnosis not present

## 2017-11-29 DIAGNOSIS — I5032 Chronic diastolic (congestive) heart failure: Secondary | ICD-10-CM | POA: Diagnosis not present

## 2017-11-29 DIAGNOSIS — R0602 Shortness of breath: Secondary | ICD-10-CM | POA: Diagnosis not present

## 2017-11-29 DIAGNOSIS — I10 Essential (primary) hypertension: Secondary | ICD-10-CM | POA: Diagnosis not present

## 2017-11-29 DIAGNOSIS — J4530 Mild persistent asthma, uncomplicated: Secondary | ICD-10-CM | POA: Diagnosis not present

## 2017-11-29 DIAGNOSIS — R269 Unspecified abnormalities of gait and mobility: Secondary | ICD-10-CM | POA: Diagnosis not present

## 2017-11-30 DIAGNOSIS — N183 Chronic kidney disease, stage 3 (moderate): Secondary | ICD-10-CM | POA: Diagnosis not present

## 2017-11-30 DIAGNOSIS — R109 Unspecified abdominal pain: Secondary | ICD-10-CM | POA: Diagnosis not present

## 2017-11-30 DIAGNOSIS — Z0100 Encounter for examination of eyes and vision without abnormal findings: Secondary | ICD-10-CM | POA: Diagnosis not present

## 2017-12-01 LAB — CUP PACEART REMOTE DEVICE CHECK
Date Time Interrogation Session: 20191022121114
Implantable Lead Implant Date: 20080923
Implantable Lead Implant Date: 20080923
Implantable Lead Implant Date: 20080923
Implantable Lead Location: 753858
Implantable Lead Location: 753859
Implantable Lead Location: 753860
Implantable Lead Model: 4285
Implantable Lead Model: 4555
Implantable Lead Model: 7120
Implantable Lead Serial Number: 170220
Implantable Lead Serial Number: 486468
Implantable Pulse Generator Implant Date: 20140923
Pulse Gen Serial Number: 100731

## 2017-12-01 NOTE — Telephone Encounter (Signed)
Patient has visit with me in 2 days for preop.   Hilbert Corrigan PA Pager: [phone]

## 2017-12-03 ENCOUNTER — Ambulatory Visit (INDEPENDENT_AMBULATORY_CARE_PROVIDER_SITE_OTHER): Payer: Medicare HMO | Admitting: Physician Assistant

## 2017-12-03 ENCOUNTER — Encounter: Payer: Self-pay | Admitting: Physician Assistant

## 2017-12-03 VITALS — BP 124/74 | HR 54 | Wt 253.0 lb

## 2017-12-03 DIAGNOSIS — E119 Type 2 diabetes mellitus without complications: Secondary | ICD-10-CM | POA: Diagnosis not present

## 2017-12-03 DIAGNOSIS — R0689 Other abnormalities of breathing: Secondary | ICD-10-CM

## 2017-12-03 DIAGNOSIS — Z950 Presence of cardiac pacemaker: Secondary | ICD-10-CM

## 2017-12-03 DIAGNOSIS — I200 Unstable angina: Secondary | ICD-10-CM

## 2017-12-03 DIAGNOSIS — G4733 Obstructive sleep apnea (adult) (pediatric): Secondary | ICD-10-CM | POA: Diagnosis not present

## 2017-12-03 DIAGNOSIS — I25810 Atherosclerosis of coronary artery bypass graft(s) without angina pectoris: Secondary | ICD-10-CM

## 2017-12-03 DIAGNOSIS — Z01810 Encounter for preprocedural cardiovascular examination: Secondary | ICD-10-CM

## 2017-12-03 DIAGNOSIS — I5032 Chronic diastolic (congestive) heart failure: Secondary | ICD-10-CM | POA: Diagnosis not present

## 2017-12-03 MED ORDER — PREDNISONE 50 MG PO TABS: ORAL_TABLET | ORAL | 0 refills | Status: DC

## 2017-12-03 NOTE — Progress Notes (Signed)
Cardiology Office Note    Date:  12/05/2017   ID:  EMMALENE KATTNER, DOB December 07, 1945, MRN [id number]  PCP:  Darcus Austin, MD  Cardiologist:  Dr. [name]   Chief Complaint  Patient presents with  . Hypotension  . Follow-up    History of Present Illness:  [name] is a 72 y.o. female with PMH of CAD s/p CABG 11/2014 (LIMA to LAD, SVG to D1, SVG to OM1 and OM2, SVG to PDA), chronic respiratory insufficiency on 2 L home O2, h/o diastolic heart failure, morbid obesity, adrenal insufficiency, type 2 diabetes mellitus, hypertension, stage III CKD, asthma and obstructive sleep apnea. She had a history of nonischemic cardiomyopathy and heart failure preceding the diagnosis of this CAD. She also had complete heart block and pacemaker placed in 2001, this was upgraded to biventricular ICD implanted in 2008 for severe cardiac myopathy with EF 20%, downgraded to CRT-P in 2014 ([place]) following excellent response from resynchronization with normalization of LV function. The current atrial and right ventricular leads were implanted in 2001, LV lead was implanted in 2008. The defibrillator leads implanted in 2008 was capped and its tip resected at the time of bypass surgery.  Last echocardiogram obtained on 10/24/2015 showed EF 55 to 60%, grade 1 DD.  She has very high atrial pacing threshold.  Device has been programmed to DDD.  Patient presented to the hospital in September 2018 with unstable angina.  Cardiac catheterization obtained on 10/09/2016 showed severe three-vessel CAD, patent LIMA to LAD, patent SVG to diagonal, patent SVG to RCA, SVG to left circumflex and OM were occluded ostially.  EF was 60%.  Right heart cath at the time showed normal wedge pressure and cardiac output.  Patient eventually and had DES x3 in the AV groove left circumflex and DES x1 in the intermediate branch on 10/31/2016.  She was placed on aspirin and Plavix.  Patient was last seen by Dr. [name] in April 2019,  carvedilol has been switched to bisoprolol.  Patient was seen in June 2019 with multiple somatic complaints.  She also reports she has been taking extra doses of the torsemide which has been helping with her symptom.  Patient presented to the ED on 09/23/2817 for chest pain.  Troponin was normal x2.  Creatinine stable at 1.4.  Patient was treated with breathing treatment.  She was felt to be euvolemic at the time.  Patient returned to the hospital in early October 2019 for evaluation of abdominal pain and diarrhea for 5 days.  This started during a trip out of town and has persisted.  When she went to her PCPs office prior to the hospital admission, she was found to be hypotensive.  In the emergency room, her blood pressure was 97/68.  Her creatinine was also 1.6 which is higher than baseline level. CT did not show any acute intra-abdominal abnormality but did show cholelithiasis. HIDA scan was ordered but patient could not tolerate the procedure.  She was discharged to follow-up with GI as outpatient.  Patient presents today for preoperative clearance and regular cardiology office visit.  For the past month and a half, she has been having worsening epigastric pain radiating to the chest and also the left shoulder.  This is somewhat similar to her presentation prior to the stent placement last year.  She has upcoming colonoscopy and also possible gallbladder surgery as well.  She is planning to see general surgery in the next few weeks.  I discussed the  case with Dr. [name], she is more than 1 year out from the stent placement, we recommend cardiac catheterization for more definitive work-up given her recent symptom.  If cardiac catheterization is negative for culprit lesion, she can stop the Plavix in order to proceed with colonoscopy and potential gallbladder surgery.  Otherwise she only has mild lower extremity edema on physical exam, apparently she only took 20 mg torsemide this morning and the plan to take  the other 20 mg once she gets home.  Her breathing is at baseline and she is currently on 2 L home O2 24/7.  Past Medical History:  Diagnosis Date  . AICD (automatic cardioverter/defibrillator) present   . Anemia   . Arthritis   . Asthma   . CAD (coronary artery disease)    a. 10/09/16 LHC: SVG->LAD patent, SVG->Diag patent, SVG->RCA patent, SVG->LCx occluded. EF 60%, b. 10/31/16 LHC DES to AV groove Circ, DES to intermed branch  . Cellulitis and abscess of foot 12/2014   RT FOOT  . CHF (congestive heart failure) ([place])   . Chronic bronchitis ([place])   . Chronic lower back pain   . Diverticulosis   . Facial numbness 02/2016  . Fatty liver disease, nonalcoholic   . Gastritis   . GERD (gastroesophageal reflux disease)   . Gout   . H/O hiatal hernia   . High cholesterol   . Hyperplastic colon polyp   . Hypertension   . IBS (irritable bowel syndrome)   . Internal hemorrhoids   . Ischemic cardiomyopathy   . Obesity   . On home oxygen therapy    "2L at night" (10/31/2016)  . OSA (obstructive sleep apnea)    "can't tolerate a mask" (10/30/2016)  . Pneumonia    "couple times" (10/31/2016)  . Shortness of breath   . TIA (transient ischemic attack)    "recently" (10/31/2016)  . Type II diabetes mellitus ([place])     Past Surgical History:  Procedure Laterality Date  . ABDOMINAL ULTRASOUND  12/01/2011   Peripelvic cysts- #1- 2.4x1.9x2.3cm, #2-1.2x0.9x1.2cm  . ANKLE FRACTURE SURGERY Right    "had rod put in"  . ANTERIOR CERVICAL DECOMP/DISCECTOMY FUSION    . APPENDECTOMY    . BACK SURGERY    . BIV ICD INSERTION CRT-D  2001?  [name] BIV PACEMAKER GENERATOR CHANGE OUT N/A 11/02/2012   Procedure: BIV PACEMAKER GENERATOR CHANGE OUT;  Surgeon: Sanda Klein, MD;  Location: [facility] CATH [facility];  Service: Cardiovascular;  Laterality: N/A;  . CARDIAC CATHETERIZATION  05/17/1999   No significant coronary obstructive disease w/ mild 20% luminal irregularity of the first diag branch of the LAD  . CARDIAC  CATHETERIZATION  07/08/2002   No significant CAD, moderately depressed LV systolic function  . CARDIAC CATHETERIZATION Bilateral 04/26/2007   Normal findings, recommend medical therapy  . CARDIAC CATHETERIZATION  02/18/2008   Moderate CAD, would benefit from having a functional study, recommend continue medical therapy  . CARDIAC CATHETERIZATION  07/23/2012   Medical therapy  . CARDIAC CATHETERIZATION N/A 11/24/2014   Procedure: Left Heart Cath and Coronary Angiography;  Surgeon: Troy Sine, MD;  Location: [facility];  Service: Cardiovascular;  Laterality: N/A;  . CARDIAC CATHETERIZATION  11/27/2014   Procedure: Intravascular Pressure Wire/FFR Study;  Surgeon: Peter M Martinique, MD;  Location: [facility];  Service: Cardiovascular;;  . CARDIAC CATHETERIZATION  10/09/2016  . CORONARY ANGIOGRAM  2010  . CORONARY ARTERY BYPASS GRAFT N/A 11/29/2014   Procedure: CORONARY ARTERY BYPASS GRAFTING  x 5 (LIMA-LAD, SVG-D, SVG-OM1-OM2, SVG-PD);  Surgeon: Melrose Nakayama, MD;  Location: [facility];  Service: Open Heart Surgery;  Laterality: N/A;  . CORONARY STENT INTERVENTION N/A 10/31/2016   Procedure: CORONARY STENT INTERVENTION;  Surgeon: Burnell Blanks, MD;  Location: [facility];  Service: Cardiovascular;  Laterality: N/A;  . FRACTURE SURGERY    . INSERT / REPLACE / [place]  . KNEE ARTHROSCOPY Bilateral   . LEFT HEART CATHETERIZATION WITH CORONARY ANGIOGRAM N/A 07/23/2012   Procedure: LEFT HEART CATHETERIZATION WITH CORONARY ANGIOGRAM;  Surgeon: Leonie Man, MD;  Location: [facility] CATH [facility];  Service: Cardiovascular;  Laterality: N/A;  Carlton Adam MYOVIEW  11/14/2011   Mild-moderate defect seen in Mid Inferolateral and Mid Anterolateral regions-consistant w/ infarct/scar. No significant ischemia demonstrated.  [name] RIGHT/LEFT HEART CATH AND CORONARY ANGIOGRAPHY N/A 10/09/2016   Procedure: RIGHT/LEFT HEART CATH AND CORONARY ANGIOGRAPHY;  Surgeon: Jolaine Artist,  MD;  Location: [facility];  Service: Cardiovascular;  Laterality: N/A;  . TEE WITHOUT CARDIOVERSION N/A 11/29/2014   Procedure: TRANSESOPHAGEAL ECHOCARDIOGRAM (TEE);  Surgeon: Melrose Nakayama, MD;  Location: [facility];  Service: Open Heart Surgery;  Laterality: N/A;  . TRANSTHORACIC ECHOCARDIOGRAM  07/23/2012   EF 55-60%, normal-mild  . TUBAL LIGATION      Current Medications: Outpatient Medications Prior to Visit  Medication Sig Dispense Refill  . acetaminophen (TYLENOL) 500 MG tablet Take 1,000 mg by mouth every 6 (six) hours as needed for mild pain.     [name] albuterol (PROVENTIL) (2.5 MG/3ML) 0.083% nebulizer solution Take 2.5 mg by nebulization every 6 (six) hours as needed for wheezing or shortness of breath.    [name] aspirin EC 81 MG tablet Take 1 tablet (81 mg total) by mouth daily. 90 tablet 3  . bisoprolol (ZEBETA) 5 MG tablet TAKE 1 TABLET EVERY DAY 90 tablet 2  . budesonide-formoterol (SYMBICORT) 160-4.5 MCG/ACT inhaler Inhale 2 puffs into the lungs 2 (two) times daily.    . cetirizine (ZYRTEC) 10 MG tablet Take 10 mg by mouth daily.     . clopidogrel (PLAVIX) 75 MG tablet TAKE 1 TABLET EVERY DAY (Patient taking differently: Take 75 mg by mouth daily. ) 90 tablet 2  . clotrimazole (LOTRIMIN) 1 % cream Apply 1 application topically 2 (two) times daily. (Patient taking differently: Apply 1 application topically 2 (two) times daily as needed (rash). ) 30 g 0  . docusate sodium (COLACE) 100 MG capsule Take 200 mg by mouth daily as needed for mild constipation.     [name] EPINEPHrine (EPIPEN 2-PAK) 0.3 mg/0.3 mL IJ SOAJ injection Inject 0.3 mg into the muscle once as needed (severe allergic reaction).    . ezetimibe (ZETIA) 10 MG tablet Take 10 mg by mouth daily.    . febuxostat (ULORIC) 40 MG tablet Take 40 mg by mouth daily.  3  . fluticasone (FLONASE) 50 MCG/ACT nasal spray Place 1 spray into both nostrils at bedtime as needed for rhinitis or allergies.     [name] guaiFENesin (MUCINEX) 600 MG  12 hr tablet Take 600 mg by mouth at bedtime.     . hydroxypropyl methylcellulose / hypromellose (ISOPTO TEARS / GONIOVISC) 2.5 % ophthalmic solution Place 1 drop into both eyes 4 (four) times daily as needed for dry eyes.     [name] ipratropium (ATROVENT) 0.02 % nebulizer solution Take 2.5 mLs (0.5 mg total) by nebulization 4 (four) times daily. 75 mL 12  . ipratropium (ATROVENT) 0.06 %  nasal spray 1-2 pffs up to 4 x daily as needed (Patient taking differently: Place 2 sprays into both nostrils 4 (four) times daily as needed for rhinitis. ) 15 mL 12  . isosorbide mononitrate (IMDUR) 60 MG 24 hr tablet Take 1 tablet (60 mg total) by mouth daily. 180 tablet 3  . metolazone (ZAROXOLYN) 5 MG tablet Take 1 tablet (5 mg total) by mouth as needed. Take only as directed by [facility] Failure [facility] (Patient taking differently: Take 5 mg by mouth daily. ) 5 tablet 0  . ondansetron (ZOFRAN ODT) 4 MG disintegrating tablet Take 1 tablet (4 mg total) by mouth every 8 (eight) hours as needed. 10 tablet 0  . OXYGEN Inhale 2 L into the lungs See admin instructions. as needed for low oxygen levels & at bedtime each night.    . pantoprazole (PROTONIX) 40 MG tablet TAKE 1 TABLET EVERY DAY 90 tablet 3  . potassium chloride SA (K-DUR,KLOR-CON) 20 MEQ tablet TAKE 1 TABLET EVERY DAY (Patient taking differently: Take 20 mEq by mouth daily. ) 90 tablet 2  . rosuvastatin (CRESTOR) 20 MG tablet TAKE 1 TABLET EVERY DAY (Patient taking differently: Take 20 mg by mouth at bedtime. ) 90 tablet 3  . spironolactone (ALDACTONE) 25 MG tablet TAKE 1 TABLET EVERY DAY (Patient taking differently: Take 25 mg by mouth at bedtime. ) 90 tablet 3  . torsemide (DEMADEX) 20 MG tablet TAKE 40 MG (2TAB) EVERY OTHER DAY ALTERNATING WITH 60 MG EVERY OTHER DAY (Patient taking differently: Take 40-60 mg by mouth See admin instructions. TAKE 40 MG (2TAB) EVERY OTHER DAY ALTERNATING WITH 60 MG (2 tabs in am, 2 tabs in pm) EVERY OTHER DAY) 225 tablet 3  .  triamcinolone cream (KENALOG) 0.1 % Apply 1 application topically 2 (two) times daily as needed (itching).     . VENTOLIN HFA 108 (90 Base) MCG/ACT inhaler INHALE 2 PUFFS EVERY 4 HOURS AS NEEDED FOR WHEEZING (Patient taking differently: Inhale 2 puffs into the lungs every 4 (four) hours as needed for wheezing or shortness of breath. ) 18 g 3  . ezetimibe (ZETIA) 10 MG tablet Take 1 tablet (10 mg total) by mouth daily. 90 tablet 3  . nitroGLYCERIN (NITROSTAT) 0.4 MG SL tablet Place 1 tablet (0.4 mg total) under the tongue every 5 (five) minutes as needed for chest pain. 25 tablet 3  . RESTASIS 0.05 % ophthalmic emulsion INSTILL 1 DROP INTO EACH EYE EVERY 12 HOURS  11   No facility-administered medications prior to visit.      Allergies:   Ivp dye [iodinated diagnostic agents]; Shellfish allergy; Shellfish-derived products; Sulfa antibiotics; Iodine; Atorvastatin; Doxycycline; Cephalexin; and Zithromax [azithromycin]   Social History   Socioeconomic History  . Marital status: Widowed    Spouse name: Not on file  . Number of children: 5  . Years of education: Not on file  . Highest education level: Not on file  Occupational History  . Occupation: STAFF/BUFFET    Employer: [employer]  Social Needs  . Financial resource strain: Very hard  . Food insecurity:    Worry: Often true    Inability: Often true  . Transportation needs:    Medical: Not on file    Non-medical: Not on file  Tobacco Use  . Smoking status: Former Smoker    Packs/day: 0.25    Years: 3.00    Pack years: 0.75    Types: Cigarettes    Last attempt to quit: 02/11/1968  Years since quitting: 49.8  . Smokeless tobacco: Never Used  Substance and Sexual Activity  . Alcohol use: No  . Drug use: No  . Sexual activity: Never  Lifestyle  . Physical activity:    Days per week: 0 days    Minutes per session: 0 min  . Stress: Very much  Relationships  . Social connections:    Talks on phone: Not on file     Gets together: Not on file    Attends religious service: Not on file    Active member of club or organization: Not on file    Attends meetings of clubs or organizations: Not on file    Relationship status: Not on file  Other Topics Concern  . Not on file  Social History Narrative   Widowed last year. She lives with her two sons.     Family History:  The patient's family history includes Asthma in her sister; Breast cancer in her mother; Diabetes in her maternal grandmother and mother; Glaucoma in her maternal aunt; Heart disease in her maternal aunt and maternal grandmother; Kidney disease in her maternal grandmother.   ROS:   Please see the history of present illness.    ROS All other systems reviewed and are negative.   PHYSICAL EXAM:   VS:  BP 124/74   Pulse (!) 54   Wt 253 lb (114.8 kg)   SpO2 97% Comment: 2 L  BMI 42.10 kg/m    GEN: Well nourished, well developed, in no acute distress  HEENT: normal  Neck: no JVD, carotid bruits, or masses Cardiac: RRR; no murmurs, rubs, or gallops,no edema  Respiratory:  clear to auscultation bilaterally, normal work of breathing GI: soft, nontender, nondistended, + BS MS: no deformity or atrophy  Skin: warm and dry, no rash Neuro:  Alert and Oriented x 3, Strength and sensation are intact Psych: euthymic mood, full affect  Wt Readings from Last 3 Encounters:  12/03/17 253 lb (114.8 kg)  11/22/17 251 lb 15.8 oz (114.3 kg)  09/23/17 257 lb 6.4 oz (116.8 kg)      Studies/Labs Reviewed:   EKG:  EKG is not ordered today.  Recent EKG reviewed  Recent Labs: 09/23/2017: B Natriuretic Peptide 172.5 11/20/2017: ALT 20 12/03/2017: BUN 36; Creatinine, Ser 1.34; Hemoglobin 12.5; Platelets 199; Potassium 3.9; Sodium 143   Lipid Panel    Component Value Date/Time   CHOL 166 10/31/2016 0314   TRIG 181 (H) 10/31/2016 0314   HDL 39 (L) 10/31/2016 0314   CHOLHDL 4.3 10/31/2016 0314   VLDL 36 10/31/2016 0314   LDLCALC 91 10/31/2016 0314     Additional studies/ records that were reviewed today include:   Echo 10/24/2015 LV EF: 55% -   60% Study Conclusions  - Left ventricle: The cavity size was normal. There was mild   concentric hypertrophy. Systolic function was normal. The   estimated ejection fraction was in the range of 55% to 60%. Wall   motion was normal; there were no regional wall motion   abnormalities. Doppler parameters are consistent with abnormal   left ventricular relaxation (grade 1 diastolic dysfunction). - Mitral valve: Calcified annulus. Mildly thickened, mildly   calcified leaflets . - Right ventricle: Pacer wire or catheter noted in right ventricle.  Impressions:  - No cardiac source of emboli was indentified.   Cath 10/09/2016  Mid RCA lesion, 90 %stenosed.  1st Mrg lesion, 85 %stenosed.  1st Diag lesion, 70 %stenosed.  Ost 2nd Diag to  2nd Diag lesion, 70 %stenosed.  Prox RCA lesion, 100 %stenosed.  Prox Cx to Dist Cx lesion, 90 %stenosed.  Ost 1st Diag lesion, 80 %stenosed.  Mid LAD to Dist LAD lesion, 100 %stenosed.  LIMA.  SVG.  Origin lesion, 100 %stenosed.   Findings:  Ao = 117/61 (83) LV = 126/8 RA = 11 RV = 39/10 PA = 37/16 (25) PCW = 13 Fick cardiac output/index = 5.1/2.3 PVR = 2.4 WU FA sat = 95% PA sat = 65%  Assessment: 1. Severe 3v CAD 2. LIMA  to LAD widely patent 3. SVG to Diagonal widely patent 4. SVG to RCA widely patent 5. SVG to LCx and OM occluded ostially 6. LVEF 60% 7. Mild PAH with normal PCWP and output  Plan/Discussion:  Suspect main cause of her SOB is her obesity but she is having some CP and has a large area of unprotected myocardium in LCX territory. Will d/w interventional team regarding PCI. Discussed with Dr. Recardo Evangelist as well.      Cath 10/31/2016 1. Unstable angina 2. Successful PTCA/DES x 3 AV groove circumflex 3. Successful PTCA/DES x 1 intermediate branch  Recommendations: Continue DAPT with ASA and Plavix  for one year.    ASSESSMENT:    1. Unstable angina ([place])   2. Preop cardiovascular exam   3. Coronary artery disease involving coronary bypass graft of native heart without angina pectoris   4. Chronic diastolic heart failure ([place])   5. Chronic respiratory insufficiency   6. Morbid obesity ()   7. Controlled type 2 diabetes mellitus without complication, without long-term current use of insulin ([place])   8. OSA (obstructive sleep apnea)   9. Presence of cardiac resynchronization therapy pacemaker (CRT-P)      PLAN:  In order of problems listed above:  1. Progressive angina: Patient presented today for preoperative clearance.  However she has been having increasing exertional chest discomfort lately.  I discussed the case with Dr. [name] who recommended proceeding with cardiac catheterization for definitive evaluation and hopefully clear her for upcoming procedure  - Risk and benefit of procedure explained to the patient who display clear understanding and agree to proceed.  Discussed with patient possible procedural risk include bleeding, vascular injury, renal injury, arrythmia, MI, stroke and loss of limb or life.  2. Preoperative clearance: Patient has a history of anemia, she also has positive colorectal cancer screening using Cologuard.  We plan diagnostic cardiac catheterization prior to her upcoming procedure  3. CAD s/p CABG: See #1, progressive chest discomfort lately.  On aspirin and Plavix.  If cardiac catheterization is negative for culprit lesion, patient will need to hold Plavix for 5 days prior to her upcoming colonoscopy.  4. Chronic respiratory failure on home O2: On 2 L 24/7 at home  5. CRT-P: Managed by Dr. [name]  6. Morbid obesity: likely contributing to her breathing issues.    Medication Adjustments/Labs and Tests Ordered: Current medicines are reviewed at length with the patient today.  Concerns regarding medicines are outlined above.  Medication  changes, Labs and Tests ordered today are listed in the Patient Instructions below. Patient Instructions  Medication Instructions:  No other changes If you need a refill on your cardiac medications before your next appointment, please call your pharmacy.   Lab work: CBC, BMET If you have labs (blood work) drawn today and your tests are completely normal, you will receive your results only by: [name] MyChart Message (if you have MyChart) OR . A paper copy  in the mail If you have any lab test that is abnormal or we need to change your treatment, we will call you to review the results.  Testing/Procedures: Your physician has requested that you have a cardiac catheterization. Cardiac catheterization is used to diagnose and/or treat various heart conditions. Doctors may recommend this procedure for a number of different reasons. The most common reason is to evaluate chest pain. Chest pain can be a symptom of coronary artery disease (CAD), and cardiac catheterization can show whether plaque is narrowing or blocking your heart's arteries. This procedure is also used to evaluate the valves, as well as measure the blood flow and oxygen levels in different parts of your heart. For further information please visit [email or web address][email or web address]. Please follow instruction sheet, as given.  Instructions BELOW  Follow-Up: At [facility], you and your health needs are our priority.  As part of our continuing mission to provide you with exceptional heart care, we have created designated Provider Care Teams.  These Care Teams include your primary Cardiologist (physician) and Advanced Practice Providers (APPs -  Physician Assistants and Nurse Practitioners) who all work together to provide you with the care you need, when you need it. . Follow up 2-3 weeks after Cath  Any Other Special Instructions Will Be Listed Below (If Applicable). None      [facility] [place] [address] Dept: [phone] Loc: [place]  12/03/2017  You are scheduled for a Cardiac Catheterization on Wednesday, October 30 with Dr. Shelva Majestic.  1. Please arrive at the [facility] (Main Entrance A) at [facility]: [address] [place], [place] [address] at 6:30 AM (This time is two hours before your procedure to ensure your preparation). Free valet parking service is available.   Special note: Every effort is made to have your procedure done on time. Please understand that emergencies sometimes delay scheduled procedures.  2. Diet: Do not eat solid foods after midnight.  The patient may have clear liquids until 5am upon the day of the procedure.  3. Labs: You will need to have blood drawn on 12/03/2017  4. Medication instructions in preparation for your procedure:   Contrast Allergy: Yes, Please take Prednisone 50mg  by mouth at: Thirteen hours prior to cath 6:00pm on Tuesday Seven hours prior to cath 12:00am on Wednesday And prior to leaving home please take last dose of Prednisone 50mg  and Benadryl 50mg  by mouth. Take at 6:00 am on Wednesday Morning.   On the morning of your procedure, take your Aspirin and any morning medicines NOT listed above.  You may use sips of water.  5. Plan for one night stay--bring personal belongings. 6. Bring a current list of your medications and current insurance cards. 7. You MUST have a responsible person to drive you home. 8. Someone MUST be with you the first 24 hours after you arrive home or your discharge will be delayed. 9. Please wear clothes that are easy to get on and off and wear slip-on shoes.  Thank you for allowing [place] to care for you!   -- [facility] Invasive Cardiovascular services        Signed, Almyra Deforest, [place]  12/05/2017 11:56 PM    [address] [place], [place],   [address] Phone: [phone]; Fax: [phone]

## 2017-12-03 NOTE — Patient Instructions (Signed)
Medication Instructions:  No other changes If you need a refill on your cardiac medications before your next appointment, please call your pharmacy.   Lab work: CBC, BMET If you have labs (blood work) drawn today and your tests are completely normal, you will receive your results only by: [name] MyChart Message (if you have MyChart) OR . A paper copy in the mail If you have any lab test that is abnormal or we need to change your treatment, we will call you to review the results.  Testing/Procedures: Your physician has requested that you have a cardiac catheterization. Cardiac catheterization is used to diagnose and/or treat various heart conditions. Doctors may recommend this procedure for a number of different reasons. The most common reason is to evaluate chest pain. Chest pain can be a symptom of coronary artery disease (CAD), and cardiac catheterization can show whether plaque is narrowing or blocking your heart's arteries. This procedure is also used to evaluate the valves, as well as measure the blood flow and oxygen levels in different parts of your heart. For further information please visit [email or web address]. Please follow instruction sheet, as given.  Instructions BELOW  Follow-Up: At [facility], you and your health needs are our priority.  As part of our continuing mission to provide you with exceptional heart care, we have created designated Provider Care Teams.  These Care Teams include your primary Cardiologist (physician) and Advanced Practice Providers (APPs -  Physician Assistants and Nurse Practitioners) who all work together to provide you with the care you need, when you need it. . Follow up 2-3 weeks after Cath  Any Other Special Instructions Will Be Listed Below (If Applicable). None      [facility] [place] [address] Dept: [phone] Loc:  [place]  12/03/2017  You are scheduled for a Cardiac Catheterization on Wednesday, October 30 with Dr. Shelva Majestic.  1. Please arrive at the [facility] (Main Entrance A) at [facility]: [address] [place], [place] [address] at 6:30 AM (This time is two hours before your procedure to ensure your preparation). Free valet parking service is available.   Special note: Every effort is made to have your procedure done on time. Please understand that emergencies sometimes delay scheduled procedures.  2. Diet: Do not eat solid foods after midnight.  The patient may have clear liquids until 5am upon the day of the procedure.  3. Labs: You will need to have blood drawn on 12/03/2017  4. Medication instructions in preparation for your procedure:   Contrast Allergy: Yes, Please take Prednisone 50mg  by mouth at: Thirteen hours prior to cath 6:00pm on Tuesday Seven hours prior to cath 12:00am on Wednesday And prior to leaving home please take last dose of Prednisone 50mg  and Benadryl 50mg  by mouth. Take at 6:00 am on Wednesday Morning.   On the morning of your procedure, take your Aspirin and any morning medicines NOT listed above.  You may use sips of water.  5. Plan for one night stay--bring personal belongings. 6. Bring a current list of your medications and current insurance cards. 7. You MUST have a responsible person to drive you home. 8. Someone MUST be with you the first 24 hours after you arrive home or your discharge will be delayed. 9. Please wear clothes that are easy to get on and off and wear slip-on shoes.  Thank you for allowing [place]  to care for you!   -- [place]

## 2017-12-03 NOTE — H&P (View-Only) (Signed)
Cardiology Office Note    Date:  12/05/2017   ID:  DEYANI HEGARTY, DOB 10/11/1945, MRN [id number]  PCP:  Darcus Austin, MD  Cardiologist:  Dr. [name]   Chief Complaint  Patient presents with  . Hypotension  . Follow-up    History of Present Illness:  [name] is a 72 y.o. female with PMH of CAD s/p CABG 11/2014 (LIMA to LAD, SVG to D1, SVG to OM1 and OM2, SVG to PDA), chronic respiratory insufficiency on 2 L home O2, h/o diastolic heart failure, morbid obesity, adrenal insufficiency, type 2 diabetes mellitus, hypertension, stage III CKD, asthma and obstructive sleep apnea. She had a history of nonischemic cardiomyopathy and heart failure preceding the diagnosis of this CAD. She also had complete heart block and pacemaker placed in 2001, this was upgraded to biventricular ICD implanted in 2008 for severe cardiac myopathy with EF 20%, downgraded to CRT-P in 2014 ([place]) following excellent response from resynchronization with normalization of LV function. The current atrial and right ventricular leads were implanted in 2001, LV lead was implanted in 2008. The defibrillator leads implanted in 2008 was capped and its tip resected at the time of bypass surgery.  Last echocardiogram obtained on 10/24/2015 showed EF 55 to 60%, grade 1 DD.  She has very high atrial pacing threshold.  Device has been programmed to DDD.  Patient presented to the hospital in September 2018 with unstable angina.  Cardiac catheterization obtained on 10/09/2016 showed severe three-vessel CAD, patent LIMA to LAD, patent SVG to diagonal, patent SVG to RCA, SVG to left circumflex and OM were occluded ostially.  EF was 60%.  Right heart cath at the time showed normal wedge pressure and cardiac output.  Patient eventually and had DES x3 in the AV groove left circumflex and DES x1 in the intermediate branch on 10/31/2016.  She was placed on aspirin and Plavix.  Patient was last seen by Dr. [name] in April 2019,  carvedilol has been switched to bisoprolol.  Patient was seen in June 2019 with multiple somatic complaints.  She also reports she has been taking extra doses of the torsemide which has been helping with her symptom.  Patient presented to the ED on 09/23/2817 for chest pain.  Troponin was normal x2.  Creatinine stable at 1.4.  Patient was treated with breathing treatment.  She was felt to be euvolemic at the time.  Patient returned to the hospital in early October 2019 for evaluation of abdominal pain and diarrhea for 5 days.  This started during a trip out of town and has persisted.  When she went to her PCPs office prior to the hospital admission, she was found to be hypotensive.  In the emergency room, her blood pressure was 97/68.  Her creatinine was also 1.6 which is higher than baseline level. CT did not show any acute intra-abdominal abnormality but did show cholelithiasis. HIDA scan was ordered but patient could not tolerate the procedure.  She was discharged to follow-up with GI as outpatient.  Patient presents today for preoperative clearance and regular cardiology office visit.  For the past month and a half, she has been having worsening epigastric pain radiating to the chest and also the left shoulder.  This is somewhat similar to her presentation prior to the stent placement last year.  She has upcoming colonoscopy and also possible gallbladder surgery as well.  She is planning to see general surgery in the next few weeks.  I discussed the  case with Dr. [name], she is more than 1 year out from the stent placement, we recommend cardiac catheterization for more definitive work-up given her recent symptom.  If cardiac catheterization is negative for culprit lesion, she can stop the Plavix in order to proceed with colonoscopy and potential gallbladder surgery.  Otherwise she only has mild lower extremity edema on physical exam, apparently she only took 20 mg torsemide this morning and the plan to take  the other 20 mg once she gets home.  Her breathing is at baseline and she is currently on 2 L home O2 24/7.  Past Medical History:  Diagnosis Date  . AICD (automatic cardioverter/defibrillator) present   . Anemia   . Arthritis   . Asthma   . CAD (coronary artery disease)    a. 10/09/16 LHC: SVG->LAD patent, SVG->Diag patent, SVG->RCA patent, SVG->LCx occluded. EF 60%, b. 10/31/16 LHC DES to AV groove Circ, DES to intermed branch  . Cellulitis and abscess of foot 12/2014   RT FOOT  . CHF (congestive heart failure) ([place])   . Chronic bronchitis ([place])   . Chronic lower back pain   . Diverticulosis   . Facial numbness 02/2016  . Fatty liver disease, nonalcoholic   . Gastritis   . GERD (gastroesophageal reflux disease)   . Gout   . H/O hiatal hernia   . High cholesterol   . Hyperplastic colon polyp   . Hypertension   . IBS (irritable bowel syndrome)   . Internal hemorrhoids   . Ischemic cardiomyopathy   . Obesity   . On home oxygen therapy    "2L at night" (10/31/2016)  . OSA (obstructive sleep apnea)    "can't tolerate a mask" (10/30/2016)  . Pneumonia    "couple times" (10/31/2016)  . Shortness of breath   . TIA (transient ischemic attack)    "recently" (10/31/2016)  . Type II diabetes mellitus ([place])     Past Surgical History:  Procedure Laterality Date  . ABDOMINAL ULTRASOUND  12/01/2011   Peripelvic cysts- #1- 2.4x1.9x2.3cm, #2-1.2x0.9x1.2cm  . ANKLE FRACTURE SURGERY Right    "had rod put in"  . ANTERIOR CERVICAL DECOMP/DISCECTOMY FUSION    . APPENDECTOMY    . BACK SURGERY    . BIV ICD INSERTION CRT-D  2001?  [name] BIV PACEMAKER GENERATOR CHANGE OUT N/A 11/02/2012   Procedure: BIV PACEMAKER GENERATOR CHANGE OUT;  Surgeon: Sanda Klein, MD;  Location: [facility] CATH LAB;  Service: Cardiovascular;  Laterality: N/A;  . CARDIAC CATHETERIZATION  05/17/1999   No significant coronary obstructive disease w/ mild 20% luminal irregularity of the first diag branch of the LAD  . CARDIAC  CATHETERIZATION  07/08/2002   No significant CAD, moderately depressed LV systolic function  . CARDIAC CATHETERIZATION Bilateral 04/26/2007   Normal findings, recommend medical therapy  . CARDIAC CATHETERIZATION  02/18/2008   Moderate CAD, would benefit from having a functional study, recommend continue medical therapy  . CARDIAC CATHETERIZATION  07/23/2012   Medical therapy  . CARDIAC CATHETERIZATION N/A 11/24/2014   Procedure: Left Heart Cath and Coronary Angiography;  Surgeon: Troy Sine, MD;  Location: [facility] CV [facility];  Service: Cardiovascular;  Laterality: N/A;  . CARDIAC CATHETERIZATION  11/27/2014   Procedure: Intravascular Pressure Wire/FFR Study;  Surgeon: Peter M Martinique, MD;  Location: [facility] CV LAB;  Service: Cardiovascular;;  . CARDIAC CATHETERIZATION  10/09/2016  . CORONARY ANGIOGRAM  2010  . CORONARY ARTERY BYPASS GRAFT N/A 11/29/2014   Procedure: CORONARY ARTERY BYPASS GRAFTING  x 5 (LIMA-LAD, SVG-D, SVG-OM1-OM2, SVG-PD);  Surgeon: Melrose Nakayama, MD;  Location: [facility];  Service: Open Heart Surgery;  Laterality: N/A;  . CORONARY STENT INTERVENTION N/A 10/31/2016   Procedure: CORONARY STENT INTERVENTION;  Surgeon: Burnell Blanks, MD;  Location: [facility] LAB;  Service: Cardiovascular;  Laterality: N/A;  . FRACTURE SURGERY    . INSERT / REPLACE / [place]  . KNEE ARTHROSCOPY Bilateral   . LEFT HEART CATHETERIZATION WITH CORONARY ANGIOGRAM N/A 07/23/2012   Procedure: LEFT HEART CATHETERIZATION WITH CORONARY ANGIOGRAM;  Surgeon: Leonie Man, MD;  Location: [facility] CATH LAB;  Service: Cardiovascular;  Laterality: N/A;  Carlton Adam MYOVIEW  11/14/2011   Mild-moderate defect seen in Mid Inferolateral and Mid Anterolateral regions-consistant w/ infarct/scar. No significant ischemia demonstrated.  [name] RIGHT/LEFT HEART CATH AND CORONARY ANGIOGRAPHY N/A 10/09/2016   Procedure: RIGHT/LEFT HEART CATH AND CORONARY ANGIOGRAPHY;  Surgeon: Jolaine Artist,  MD;  Location: [facility] LAB;  Service: Cardiovascular;  Laterality: N/A;  . TEE WITHOUT CARDIOVERSION N/A 11/29/2014   Procedure: TRANSESOPHAGEAL ECHOCARDIOGRAM (TEE);  Surgeon: Melrose Nakayama, MD;  Location: [facility];  Service: Open Heart Surgery;  Laterality: N/A;  . TRANSTHORACIC ECHOCARDIOGRAM  07/23/2012   EF 55-60%, normal-mild  . TUBAL LIGATION      Current Medications: Outpatient Medications Prior to Visit  Medication Sig Dispense Refill  . acetaminophen (TYLENOL) 500 MG tablet Take 1,000 mg by mouth every 6 (six) hours as needed for mild pain.     [name] albuterol (PROVENTIL) (2.5 MG/3ML) 0.083% nebulizer solution Take 2.5 mg by nebulization every 6 (six) hours as needed for wheezing or shortness of breath.    [name] aspirin EC 81 MG tablet Take 1 tablet (81 mg total) by mouth daily. 90 tablet 3  . bisoprolol (ZEBETA) 5 MG tablet TAKE 1 TABLET EVERY DAY 90 tablet 2  . budesonide-formoterol (SYMBICORT) 160-4.5 MCG/ACT inhaler Inhale 2 puffs into the lungs 2 (two) times daily.    . cetirizine (ZYRTEC) 10 MG tablet Take 10 mg by mouth daily.     . clopidogrel (PLAVIX) 75 MG tablet TAKE 1 TABLET EVERY DAY (Patient taking differently: Take 75 mg by mouth daily. ) 90 tablet 2  . clotrimazole (LOTRIMIN) 1 % cream Apply 1 application topically 2 (two) times daily. (Patient taking differently: Apply 1 application topically 2 (two) times daily as needed (rash). ) 30 g 0  . docusate sodium (COLACE) 100 MG capsule Take 200 mg by mouth daily as needed for mild constipation.     [name] EPINEPHrine (EPIPEN 2-PAK) 0.3 mg/0.3 mL IJ SOAJ injection Inject 0.3 mg into the muscle once as needed (severe allergic reaction).    . ezetimibe (ZETIA) 10 MG tablet Take 10 mg by mouth daily.    . febuxostat (ULORIC) 40 MG tablet Take 40 mg by mouth daily.  3  . fluticasone (FLONASE) 50 MCG/ACT nasal spray Place 1 spray into both nostrils at bedtime as needed for rhinitis or allergies.     [name] guaiFENesin (MUCINEX) 600 MG  12 hr tablet Take 600 mg by mouth at bedtime.     . hydroxypropyl methylcellulose / hypromellose (ISOPTO TEARS / GONIOVISC) 2.5 % ophthalmic solution Place 1 drop into both eyes 4 (four) times daily as needed for dry eyes.     [name] ipratropium (ATROVENT) 0.02 % nebulizer solution Take 2.5 mLs (0.5 mg total) by nebulization 4 (four) times daily. 75 mL 12  . ipratropium (ATROVENT) 0.06 %  nasal spray 1-2 pffs up to 4 x daily as needed (Patient taking differently: Place 2 sprays into both nostrils 4 (four) times daily as needed for rhinitis. ) 15 mL 12  . isosorbide mononitrate (IMDUR) 60 MG 24 hr tablet Take 1 tablet (60 mg total) by mouth daily. 180 tablet 3  . metolazone (ZAROXOLYN) 5 MG tablet Take 1 tablet (5 mg total) by mouth as needed. Take only as directed by [facility] Failure Clinic (Patient taking differently: Take 5 mg by mouth daily. ) 5 tablet 0  . ondansetron (ZOFRAN ODT) 4 MG disintegrating tablet Take 1 tablet (4 mg total) by mouth every 8 (eight) hours as needed. 10 tablet 0  . OXYGEN Inhale 2 L into the lungs See admin instructions. as needed for low oxygen levels & at bedtime each night.    . pantoprazole (PROTONIX) 40 MG tablet TAKE 1 TABLET EVERY DAY 90 tablet 3  . potassium chloride SA (K-DUR,KLOR-CON) 20 MEQ tablet TAKE 1 TABLET EVERY DAY (Patient taking differently: Take 20 mEq by mouth daily. ) 90 tablet 2  . rosuvastatin (CRESTOR) 20 MG tablet TAKE 1 TABLET EVERY DAY (Patient taking differently: Take 20 mg by mouth at bedtime. ) 90 tablet 3  . spironolactone (ALDACTONE) 25 MG tablet TAKE 1 TABLET EVERY DAY (Patient taking differently: Take 25 mg by mouth at bedtime. ) 90 tablet 3  . torsemide (DEMADEX) 20 MG tablet TAKE 40 MG (2TAB) EVERY OTHER DAY ALTERNATING WITH 60 MG EVERY OTHER DAY (Patient taking differently: Take 40-60 mg by mouth See admin instructions. TAKE 40 MG (2TAB) EVERY OTHER DAY ALTERNATING WITH 60 MG (2 tabs in am, 2 tabs in pm) EVERY OTHER DAY) 225 tablet 3  .  triamcinolone cream (KENALOG) 0.1 % Apply 1 application topically 2 (two) times daily as needed (itching).     . VENTOLIN HFA 108 (90 Base) MCG/ACT inhaler INHALE 2 PUFFS EVERY 4 HOURS AS NEEDED FOR WHEEZING (Patient taking differently: Inhale 2 puffs into the lungs every 4 (four) hours as needed for wheezing or shortness of breath. ) 18 g 3  . ezetimibe (ZETIA) 10 MG tablet Take 1 tablet (10 mg total) by mouth daily. 90 tablet 3  . nitroGLYCERIN (NITROSTAT) 0.4 MG SL tablet Place 1 tablet (0.4 mg total) under the tongue every 5 (five) minutes as needed for chest pain. 25 tablet 3  . RESTASIS 0.05 % ophthalmic emulsion INSTILL 1 DROP INTO EACH EYE EVERY 12 HOURS  11   No facility-administered medications prior to visit.      Allergies:   Ivp dye [iodinated diagnostic agents]; Shellfish allergy; Shellfish-derived products; Sulfa antibiotics; Iodine; Atorvastatin; Doxycycline; Cephalexin; and Zithromax [azithromycin]   Social History   Socioeconomic History  . Marital status: Widowed    Spouse name: Not on file  . Number of children: 5  . Years of education: Not on file  . Highest education level: Not on file  Occupational History  . Occupation: STAFF/BUFFET    Employer: [employer]  Social Needs  . Financial resource strain: Very hard  . Food insecurity:    Worry: Often true    Inability: Often true  . Transportation needs:    Medical: Not on file    Non-medical: Not on file  Tobacco Use  . Smoking status: Former Smoker    Packs/day: 0.25    Years: 3.00    Pack years: 0.75    Types: Cigarettes    Last attempt to quit: 02/11/1968  Years since quitting: 49.8  . Smokeless tobacco: Never Used  Substance and Sexual Activity  . Alcohol use: No  . Drug use: No  . Sexual activity: Never  Lifestyle  . Physical activity:    Days per week: 0 days    Minutes per session: 0 min  . Stress: Very much  Relationships  . Social connections:    Talks on phone: Not on file     Gets together: Not on file    Attends religious service: Not on file    Active member of club or organization: Not on file    Attends meetings of clubs or organizations: Not on file    Relationship status: Not on file  Other Topics Concern  . Not on file  Social History Narrative   Widowed last year. She lives with her two sons.     Family History:  The patient's family history includes Asthma in her sister; Breast cancer in her mother; Diabetes in her maternal grandmother and mother; Glaucoma in her maternal aunt; Heart disease in her maternal aunt and maternal grandmother; Kidney disease in her maternal grandmother.   ROS:   Please see the history of present illness.    ROS All other systems reviewed and are negative.   PHYSICAL EXAM:   VS:  BP 124/74   Pulse (!) 54   Wt 253 lb (114.8 kg)   SpO2 97% Comment: 2 L  BMI 42.10 kg/m    GEN: Well nourished, well developed, in no acute distress  HEENT: normal  Neck: no JVD, carotid bruits, or masses Cardiac: RRR; no murmurs, rubs, or gallops,no edema  Respiratory:  clear to auscultation bilaterally, normal work of breathing GI: soft, nontender, nondistended, + BS MS: no deformity or atrophy  Skin: warm and dry, no rash Neuro:  Alert and Oriented x 3, Strength and sensation are intact Psych: euthymic mood, full affect  Wt Readings from Last 3 Encounters:  12/03/17 253 lb (114.8 kg)  11/22/17 251 lb 15.8 oz (114.3 kg)  09/23/17 257 lb 6.4 oz (116.8 kg)      Studies/Labs Reviewed:   EKG:  EKG is not ordered today.  Recent EKG reviewed  Recent Labs: 09/23/2017: B Natriuretic Peptide 172.5 11/20/2017: ALT 20 12/03/2017: BUN 36; Creatinine, Ser 1.34; Hemoglobin 12.5; Platelets 199; Potassium 3.9; Sodium 143   Lipid Panel    Component Value Date/Time   CHOL 166 10/31/2016 0314   TRIG 181 (H) 10/31/2016 0314   HDL 39 (L) 10/31/2016 0314   CHOLHDL 4.3 10/31/2016 0314   VLDL 36 10/31/2016 0314   LDLCALC 91 10/31/2016 0314     Additional studies/ records that were reviewed today include:   Echo 10/24/2015 LV EF: 55% -   60% Study Conclusions  - Left ventricle: The cavity size was normal. There was mild   concentric hypertrophy. Systolic function was normal. The   estimated ejection fraction was in the range of 55% to 60%. Wall   motion was normal; there were no regional wall motion   abnormalities. Doppler parameters are consistent with abnormal   left ventricular relaxation (grade 1 diastolic dysfunction). - Mitral valve: Calcified annulus. Mildly thickened, mildly   calcified leaflets . - Right ventricle: Pacer wire or catheter noted in right ventricle.  Impressions:  - No cardiac source of emboli was indentified.   Cath 10/09/2016  Mid RCA lesion, 90 %stenosed.  1st Mrg lesion, 85 %stenosed.  1st Diag lesion, 70 %stenosed.  Ost 2nd Diag to  2nd Diag lesion, 70 %stenosed.  Prox RCA lesion, 100 %stenosed.  Prox Cx to Dist Cx lesion, 90 %stenosed.  Ost 1st Diag lesion, 80 %stenosed.  Mid LAD to Dist LAD lesion, 100 %stenosed.  LIMA.  SVG.  Origin lesion, 100 %stenosed.   Findings:  Ao = 117/61 (83) LV = 126/8 RA = 11 RV = 39/10 PA = 37/16 (25) PCW = 13 Fick cardiac output/index = 5.1/2.3 PVR = 2.4 WU FA sat = 95% PA sat = 65%  Assessment: 1. Severe 3v CAD 2. LIMA  to LAD widely patent 3. SVG to Diagonal widely patent 4. SVG to RCA widely patent 5. SVG to LCx and OM occluded ostially 6. LVEF 60% 7. Mild PAH with normal PCWP and output  Plan/Discussion:  Suspect main cause of her SOB is her obesity but she is having some CP and has a large area of unprotected myocardium in LCX territory. Will d/w interventional team regarding PCI. Discussed with Dr. Recardo Evangelist as well.      Cath 10/31/2016 1. Unstable angina 2. Successful PTCA/DES x 3 AV groove circumflex 3. Successful PTCA/DES x 1 intermediate branch  Recommendations: Continue DAPT with ASA and Plavix  for one year.    ASSESSMENT:    1. Unstable angina ([place])   2. Preop cardiovascular exam   3. Coronary artery disease involving coronary bypass graft of native heart without angina pectoris   4. Chronic diastolic heart failure ([place])   5. Chronic respiratory insufficiency   6. Morbid obesity ([place])   7. Controlled type 2 diabetes mellitus without complication, without long-term current use of insulin ([place])   8. OSA (obstructive sleep apnea)   9. Presence of cardiac resynchronization therapy pacemaker (CRT-P)      PLAN:  In order of problems listed above:  1. Progressive angina: Patient presented today for preoperative clearance.  However she has been having increasing exertional chest discomfort lately.  I discussed the case with Dr. [name] who recommended proceeding with cardiac catheterization for definitive evaluation and hopefully clear her for upcoming procedure  - Risk and benefit of procedure explained to the patient who display clear understanding and agree to proceed.  Discussed with patient possible procedural risk include bleeding, vascular injury, renal injury, arrythmia, MI, stroke and loss of limb or life.  2. Preoperative clearance: Patient has a history of anemia, she also has positive colorectal cancer screening using Cologuard.  We plan diagnostic cardiac catheterization prior to her upcoming procedure  3. CAD s/p CABG: See #1, progressive chest discomfort lately.  On aspirin and Plavix.  If cardiac catheterization is negative for culprit lesion, patient will need to hold Plavix for 5 days prior to her upcoming colonoscopy.  4. Chronic respiratory failure on home O2: On 2 L 24/7 at home  5. CRT-P: Managed by Dr. [name]  6. Morbid obesity: likely contributing to her breathing issues.    Medication Adjustments/Labs and Tests Ordered: Current medicines are reviewed at length with the patient today.  Concerns regarding medicines are outlined above.  Medication  changes, Labs and Tests ordered today are listed in the Patient Instructions below. Patient Instructions  Medication Instructions:  No other changes If you need a refill on your cardiac medications before your next appointment, please call your pharmacy.   Lab work: CBC, BMET If you have labs (blood work) drawn today and your tests are completely normal, you will receive your results only by: [name] MyChart Message (if you have MyChart) OR . A paper copy  in the mail If you have any lab test that is abnormal or we need to change your treatment, we will call you to review the results.  Testing/Procedures: Your physician has requested that you have a cardiac catheterization. Cardiac catheterization is used to diagnose and/or treat various heart conditions. Doctors may recommend this procedure for a number of different reasons. The most common reason is to evaluate chest pain. Chest pain can be a symptom of coronary artery disease (CAD), and cardiac catheterization can show whether plaque is narrowing or blocking your heart's arteries. This procedure is also used to evaluate the valves, as well as measure the blood flow and oxygen levels in different parts of your heart. For further information please visit [email or web address][email or web address]. Please follow instruction sheet, as given.  Instructions BELOW  Follow-Up: At [facility], you and your health needs are our priority.  As part of our continuing mission to provide you with exceptional heart care, we have created designated Provider Care Teams.  These Care Teams include your primary Cardiologist (physician) and Advanced Practice Providers (APPs -  Physician Assistants and Nurse Practitioners) who all work together to provide you with the care you need, when you need it. . Follow up 2-3 weeks after Cath  Any Other Special Instructions Will Be Listed Below (If Applicable). None      [facility] [place] [address] Dept: [phone] Loc: [place]  12/03/2017  You are scheduled for a Cardiac Catheterization on Wednesday, October 30 with Dr. Shelva Majestic.  1. Please arrive at the [facility] (Main Entrance A) at [facility] [address] [place], [place] [address] at 6:30 AM (This time is two hours before your procedure to ensure your preparation). Free valet parking service is available.   Special note: Every effort is made to have your procedure done on time. Please understand that emergencies sometimes delay scheduled procedures.  2. Diet: Do not eat solid foods after midnight.  The patient may have clear liquids until 5am upon the day of the procedure.  3. Labs: You will need to have blood drawn on 12/03/2017  4. Medication instructions in preparation for your procedure:   Contrast Allergy: Yes, Please take Prednisone 50mg  by mouth at: Thirteen hours prior to cath 6:00pm on Tuesday Seven hours prior to cath 12:00am on Wednesday And prior to leaving home please take last dose of Prednisone 50mg  and Benadryl 50mg  by mouth. Take at 6:00 am on Wednesday Morning.   On the morning of your procedure, take your Aspirin and any morning medicines NOT listed above.  You may use sips of water.  5. Plan for one night stay--bring personal belongings. 6. Bring a current list of your medications and current insurance cards. 7. You MUST have a responsible person to drive you home. 8. Someone MUST be with you the first 24 hours after you arrive home or your discharge will be delayed. 9. Please wear clothes that are easy to get on and off and wear slip-on shoes.  Thank you for allowing [place] to care for you!   -- [facility] Invasive Cardiovascular services        Signed, Almyra Deforest, [place]  12/05/2017 11:56 PM    [address], [place], [place]  [address] Phone: [phone]; Fax: [phone]

## 2017-12-04 LAB — CBC
Hematocrit: 39.7 % (ref 34.0–46.6)
Hemoglobin: 12.5 g/dL (ref 11.1–15.9)
MCH: 26.9 pg (ref 26.6–33.0)
MCHC: 31.5 g/dL (ref 31.5–35.7)
MCV: 86 fL (ref 79–97)
Platelets: 199 10*3/uL (ref 150–450)
RBC: 4.64 x10E6/uL (ref 3.77–5.28)
RDW: 13.5 % (ref 12.3–15.4)
WBC: 7.4 10*3/uL (ref 3.4–10.8)

## 2017-12-04 LAB — BASIC METABOLIC PANEL
BUN/Creatinine Ratio: 27 (ref 12–28)
BUN: 36 mg/dL — ABNORMAL HIGH (ref 8–27)
CO2: 29 mmol/L (ref 20–29)
Calcium: 9.2 mg/dL (ref 8.7–10.3)
Chloride: 99 mmol/L (ref 96–106)
Creatinine, Ser: 1.34 mg/dL — ABNORMAL HIGH (ref 0.57–1.00)
GFR calc Af Amer: 45 mL/min/{1.73_m2} — ABNORMAL LOW (ref >59)
GFR calc non Af Amer: 39 mL/min/{1.73_m2} — ABNORMAL LOW (ref >59)
Glucose: 118 mg/dL — ABNORMAL HIGH (ref 65–99)
Potassium: 3.9 mmol/L (ref 3.5–5.2)
Sodium: 143 mmol/L (ref 134–144)

## 2017-12-04 NOTE — Telephone Encounter (Signed)
Patient was seen yesterday is she ok to hold her blood thinner and how many days before procedures on 11/19  Thank You

## 2017-12-05 ENCOUNTER — Encounter: Payer: Self-pay | Admitting: Physician Assistant

## 2017-12-05 DIAGNOSIS — I5032 Chronic diastolic (congestive) heart failure: Secondary | ICD-10-CM | POA: Diagnosis not present

## 2017-12-05 DIAGNOSIS — J9611 Chronic respiratory failure with hypoxia: Secondary | ICD-10-CM | POA: Diagnosis not present

## 2017-12-05 DIAGNOSIS — I10 Essential (primary) hypertension: Secondary | ICD-10-CM | POA: Diagnosis not present

## 2017-12-05 DIAGNOSIS — J4520 Mild intermittent asthma, uncomplicated: Secondary | ICD-10-CM | POA: Diagnosis not present

## 2017-12-05 DIAGNOSIS — R269 Unspecified abnormalities of gait and mobility: Secondary | ICD-10-CM | POA: Diagnosis not present

## 2017-12-05 DIAGNOSIS — R0602 Shortness of breath: Secondary | ICD-10-CM | POA: Diagnosis not present

## 2017-12-07 ENCOUNTER — Other Ambulatory Visit: Payer: Self-pay | Admitting: Physician Assistant

## 2017-12-07 ENCOUNTER — Telehealth: Payer: Self-pay | Admitting: *Deleted

## 2017-12-07 MED ORDER — DIPHENHYDRAMINE HCL 50 MG/ML IJ SOLN: 50.0000 mg | Freq: Once | INTRAMUSCULAR | Status: DC

## 2017-12-07 NOTE — Telephone Encounter (Addendum)
Pt contacted pre-catheterization scheduled at [facility] for: Wednesday December 09, 2017 1 PM Verified arrival time and place: [place] Entrance A at: 8 AM-for pre-procedure hydration  No solid food after midnight prior to cath, clear liquids until 5 AM day of procedure. Contrast allergy: yes-13 hour Prednisone and Benadryl Prep Prednisone 50 mg- midnight day of procedure-0000 Prednisone 50 mg 6 AM 12/09/17 Prednisone 50 mg and Benadryl 50 mg pt will take with her to take after she gets to hospital since she is going in early for hydration.  Hold: Onglyza-AM of procedure Torsemide-day before and day of procedure. Pt states she does not feel she can hold torsemide the day before procedure, she may gain weight. Pt states she will take 2 pills instead of 3 pills day before procedure and not take day of procedure. KCl-day of procedure. Spironoloactone-day before procedure-pt takes in the PM Zaroxlyn-day before and day of procedure. Pt states she only takes prn and has not taken recently.  Except hold medications AM meds can be  taken pre-cath with sip of water including: ASA 81 mg Clopidogrel 75 mg   Confirmed patient has responsible person to drive home post procedure and for 24 hours after you arrive home: yes  I spoke with patient 12/07/17 and 12/08/17,reviewed instructions with her, she verbalized understanding, thanked me for call.

## 2017-12-09 ENCOUNTER — Encounter (HOSPITAL_COMMUNITY)
Admission: RE | Disposition: A | Discharge status: Home / Self Care | Payer: Self-pay | Source: Ambulatory Visit | Attending: Cardiovascular Disease

## 2017-12-09 ENCOUNTER — Other Ambulatory Visit: Payer: Self-pay

## 2017-12-09 ENCOUNTER — Ambulatory Visit (HOSPITAL_COMMUNITY)
Admission: RE | Admit: 2017-12-09 | Discharge: 2017-12-09 | Disposition: A | Discharge status: Home / Self Care | Payer: Medicare HMO | Source: Ambulatory Visit | Attending: Cardiovascular Disease | Admitting: Cardiovascular Disease

## 2017-12-09 DIAGNOSIS — J45909 Unspecified asthma, uncomplicated: Secondary | ICD-10-CM | POA: Diagnosis not present

## 2017-12-09 DIAGNOSIS — Z841 Family history of disorders of kidney and ureter: Secondary | ICD-10-CM | POA: Insufficient documentation

## 2017-12-09 DIAGNOSIS — Z8673 Personal history of transient ischemic attack (TIA), and cerebral infarction without residual deficits: Secondary | ICD-10-CM | POA: Insufficient documentation

## 2017-12-09 DIAGNOSIS — I959 Hypotension, unspecified: Secondary | ICD-10-CM | POA: Insufficient documentation

## 2017-12-09 DIAGNOSIS — Z6841 Body Mass Index (BMI) 40.0 and over, adult: Secondary | ICD-10-CM | POA: Insufficient documentation

## 2017-12-09 DIAGNOSIS — E2740 Unspecified adrenocortical insufficiency: Secondary | ICD-10-CM | POA: Insufficient documentation

## 2017-12-09 DIAGNOSIS — I428 Other cardiomyopathies: Secondary | ICD-10-CM | POA: Insufficient documentation

## 2017-12-09 DIAGNOSIS — Z87891 Personal history of nicotine dependence: Secondary | ICD-10-CM | POA: Insufficient documentation

## 2017-12-09 DIAGNOSIS — Z79899 Other long term (current) drug therapy: Secondary | ICD-10-CM | POA: Insufficient documentation

## 2017-12-09 DIAGNOSIS — K219 Gastro-esophageal reflux disease without esophagitis: Secondary | ICD-10-CM | POA: Diagnosis not present

## 2017-12-09 DIAGNOSIS — E1122 Type 2 diabetes mellitus with diabetic chronic kidney disease: Secondary | ICD-10-CM | POA: Diagnosis not present

## 2017-12-09 DIAGNOSIS — Z7951 Long term (current) use of inhaled steroids: Secondary | ICD-10-CM | POA: Insufficient documentation

## 2017-12-09 DIAGNOSIS — R0689 Other abnormalities of breathing: Secondary | ICD-10-CM | POA: Insufficient documentation

## 2017-12-09 DIAGNOSIS — I25708 Atherosclerosis of coronary artery bypass graft(s), unspecified, with other forms of angina pectoris: Secondary | ICD-10-CM

## 2017-12-09 DIAGNOSIS — I2582 Chronic total occlusion of coronary artery: Secondary | ICD-10-CM | POA: Insufficient documentation

## 2017-12-09 DIAGNOSIS — M109 Gout, unspecified: Secondary | ICD-10-CM | POA: Insufficient documentation

## 2017-12-09 DIAGNOSIS — Z9851 Tubal ligation status: Secondary | ICD-10-CM | POA: Insufficient documentation

## 2017-12-09 DIAGNOSIS — Z888 Allergy status to other drugs, medicaments and biological substances status: Secondary | ICD-10-CM | POA: Insufficient documentation

## 2017-12-09 DIAGNOSIS — Z8249 Family history of ischemic heart disease and other diseases of the circulatory system: Secondary | ICD-10-CM | POA: Diagnosis not present

## 2017-12-09 DIAGNOSIS — Z882 Allergy status to sulfonamides status: Secondary | ICD-10-CM | POA: Insufficient documentation

## 2017-12-09 DIAGNOSIS — I255 Ischemic cardiomyopathy: Secondary | ICD-10-CM | POA: Diagnosis not present

## 2017-12-09 DIAGNOSIS — Z7982 Long term (current) use of aspirin: Secondary | ICD-10-CM | POA: Insufficient documentation

## 2017-12-09 DIAGNOSIS — I442 Atrioventricular block, complete: Secondary | ICD-10-CM | POA: Insufficient documentation

## 2017-12-09 DIAGNOSIS — Z91013 Allergy to seafood: Secondary | ICD-10-CM | POA: Insufficient documentation

## 2017-12-09 DIAGNOSIS — Z86010 Personal history of colonic polyps: Secondary | ICD-10-CM | POA: Insufficient documentation

## 2017-12-09 DIAGNOSIS — Z91041 Radiographic dye allergy status: Secondary | ICD-10-CM | POA: Insufficient documentation

## 2017-12-09 DIAGNOSIS — Z955 Presence of coronary angioplasty implant and graft: Secondary | ICD-10-CM | POA: Diagnosis not present

## 2017-12-09 DIAGNOSIS — D649 Anemia, unspecified: Secondary | ICD-10-CM | POA: Insufficient documentation

## 2017-12-09 DIAGNOSIS — N183 Chronic kidney disease, stage 3 (moderate): Secondary | ICD-10-CM | POA: Insufficient documentation

## 2017-12-09 DIAGNOSIS — Z9889 Other specified postprocedural states: Secondary | ICD-10-CM | POA: Insufficient documentation

## 2017-12-09 DIAGNOSIS — Z7902 Long term (current) use of antithrombotics/antiplatelets: Secondary | ICD-10-CM | POA: Diagnosis not present

## 2017-12-09 DIAGNOSIS — M1990 Unspecified osteoarthritis, unspecified site: Secondary | ICD-10-CM | POA: Insufficient documentation

## 2017-12-09 DIAGNOSIS — K580 Irritable bowel syndrome with diarrhea: Secondary | ICD-10-CM | POA: Insufficient documentation

## 2017-12-09 DIAGNOSIS — Z881 Allergy status to other antibiotic agents status: Secondary | ICD-10-CM | POA: Insufficient documentation

## 2017-12-09 DIAGNOSIS — E7800 Pure hypercholesterolemia, unspecified: Secondary | ICD-10-CM | POA: Diagnosis not present

## 2017-12-09 DIAGNOSIS — I130 Hypertensive heart and chronic kidney disease with heart failure and stage 1 through stage 4 chronic kidney disease, or unspecified chronic kidney disease: Secondary | ICD-10-CM | POA: Insufficient documentation

## 2017-12-09 DIAGNOSIS — G4733 Obstructive sleep apnea (adult) (pediatric): Secondary | ICD-10-CM | POA: Insufficient documentation

## 2017-12-09 DIAGNOSIS — Z9981 Dependence on supplemental oxygen: Secondary | ICD-10-CM | POA: Insufficient documentation

## 2017-12-09 DIAGNOSIS — I5032 Chronic diastolic (congestive) heart failure: Secondary | ICD-10-CM | POA: Insufficient documentation

## 2017-12-09 DIAGNOSIS — Z981 Arthrodesis status: Secondary | ICD-10-CM | POA: Insufficient documentation

## 2017-12-09 HISTORY — PX: LEFT HEART CATH AND CORS/GRAFTS ANGIOGRAPHY: CATH118250

## 2017-12-09 LAB — GLUCOSE, CAPILLARY: Glucose-Capillary: 186 mg/dL — ABNORMAL HIGH (ref 70–99)

## 2017-12-09 SURGERY — LEFT HEART CATH AND CORS/GRAFTS ANGIOGRAPHY
Anesthesia: LOCAL

## 2017-12-09 MED ORDER — SODIUM CHLORIDE 0.9 % WEIGHT BASED INFUSION
3.0000 mL/kg/h | INTRAVENOUS | Status: AC
  Administered 2017-12-09: 3 mL/kg/h via INTRAVENOUS

## 2017-12-09 MED ORDER — FENTANYL CITRATE (PF) 100 MCG/2ML IJ SOLN
INTRAMUSCULAR | Status: AC
  Filled 2017-12-09: qty 2

## 2017-12-09 MED ORDER — MIDAZOLAM HCL 2 MG/2ML IJ SOLN
INTRAMUSCULAR | Status: AC
  Filled 2017-12-09: qty 2

## 2017-12-09 MED ORDER — SODIUM CHLORIDE 0.9 % WEIGHT BASED INFUSION: 1.0000 mL/kg/h | INTRAVENOUS | Status: DC

## 2017-12-09 MED ORDER — SODIUM CHLORIDE 0.9% FLUSH: 3.0000 mL | INTRAVENOUS | Status: DC | PRN

## 2017-12-09 MED ORDER — HEPARIN (PORCINE) IN NACL 1000-0.9 UT/500ML-% IV SOLN
INTRAVENOUS | Status: AC
  Filled 2017-12-09: qty 1000

## 2017-12-09 MED ORDER — LIDOCAINE HCL (PF) 1 % IJ SOLN
INTRAMUSCULAR | Status: DC | PRN
  Administered 2017-12-09: 20 mL
  Administered 2017-12-09: 10 mL

## 2017-12-09 MED ORDER — FENTANYL CITRATE (PF) 100 MCG/2ML IJ SOLN
INTRAMUSCULAR | Status: DC | PRN
  Administered 2017-12-09: 25 ug via INTRAVENOUS

## 2017-12-09 MED ORDER — HEPARIN (PORCINE) IN NACL 1000-0.9 UT/500ML-% IV SOLN
INTRAVENOUS | Status: DC | PRN
  Administered 2017-12-09 (×2): 500 mL

## 2017-12-09 MED ORDER — SODIUM CHLORIDE 0.9 % IV SOLN: 250.0000 mL | INTRAVENOUS | Status: DC | PRN

## 2017-12-09 MED ORDER — MIDAZOLAM HCL 2 MG/2ML IJ SOLN
INTRAMUSCULAR | Status: DC | PRN
  Administered 2017-12-09 (×2): 1 mg via INTRAVENOUS

## 2017-12-09 MED ORDER — DIPHENHYDRAMINE HCL 25 MG PO CAPS
50.0000 mg | ORAL_CAPSULE | Freq: Once | ORAL | Status: AC
  Administered 2017-12-09: 50 mg via ORAL
  Filled 2017-12-09: qty 2

## 2017-12-09 MED ORDER — IOHEXOL 350 MG/ML SOLN
INTRAVENOUS | Status: DC | PRN
  Administered 2017-12-09: 255 mL via INTRAVENOUS

## 2017-12-09 MED ORDER — ASPIRIN 81 MG PO CHEW: 81.0000 mg | CHEWABLE_TABLET | ORAL | Status: DC

## 2017-12-09 MED ORDER — LIDOCAINE HCL (PF) 1 % IJ SOLN
INTRAMUSCULAR | Status: AC
  Filled 2017-12-09: qty 30

## 2017-12-09 MED ORDER — SODIUM CHLORIDE 0.9% FLUSH: 3.0000 mL | Freq: Two times a day (BID) | INTRAVENOUS | Status: DC

## 2017-12-09 SURGICAL SUPPLY — 15 items
CATH INFINITI 5 FR IM (CATHETERS) ×2 IMPLANT
CATH INFINITI 5 FR LCB (CATHETERS) ×2 IMPLANT
CATH INFINITI 5 FR RCB (CATHETERS) ×2 IMPLANT
CATH INFINITI 5FR JL5 (CATHETERS) ×2 IMPLANT
CATH INFINITI 5FR MULTPACK ANG (CATHETERS) ×2 IMPLANT
CLOSURE MYNX CONTROL 5F (Vascular Products) ×2 IMPLANT
HOVERMATT SINGLE USE (MISCELLANEOUS) ×2 IMPLANT
KIT HEART LEFT (KITS) ×2 IMPLANT
PACK CARDIAC CATHETERIZATION (CUSTOM PROCEDURE TRAY) ×2 IMPLANT
SHEATH PINNACLE 5F 10CM (SHEATH) ×2 IMPLANT
SYR MEDRAD MARK V 150ML (SYRINGE) ×2 IMPLANT
TRANSDUCER W/STOPCOCK (MISCELLANEOUS) ×2 IMPLANT
TUBING CIL FLEX 10 FLL-RA (TUBING) ×2 IMPLANT
WIRE EMERALD 3MM-J .035X150CM (WIRE) ×2 IMPLANT
WIRE EMERALD 3MM-J .035X260CM (WIRE) ×2 IMPLANT

## 2017-12-09 NOTE — Discharge Instructions (Signed)

## 2017-12-09 NOTE — Interval H&P Note (Signed)
Cath Lab Visit (complete for each Cath Lab visit)  Clinical Evaluation Leading to the Procedure:   ACS: No.  Non-ACS:    Anginal Classification: CCS III  Anti-ischemic medical therapy: Minimal Therapy (1 class of medications)  Non-Invasive Test Results: No non-invasive testing performed  Prior CABG: Previous CABG      History and Physical Interval Note:  12/09/2017 2:12 PM  [name]  has presented today for surgery, with the diagnosis of ua - surgical clearence  The various methods of treatment have been discussed with the patient and family. After consideration of risks, benefits and other options for treatment, the patient has consented to  Procedure(s): LEFT HEART CATH AND CORS/GRAFTS ANGIOGRAPHY (N/A) as a surgical intervention .  The patient's history has been reviewed, patient examined, no change in status, stable for surgery.  I have reviewed the patient's chart and labs.  Questions were answered to the patient's satisfaction.     Shelva Majestic

## 2017-12-10 ENCOUNTER — Telehealth: Payer: Self-pay

## 2017-12-10 ENCOUNTER — Encounter (HOSPITAL_COMMUNITY): Payer: Self-pay | Admitting: Cardiovascular Disease

## 2017-12-10 NOTE — Telephone Encounter (Signed)
   Primary Cardiologist: Sanda Klein, MD  Chart reviewed as part of pre-operative protocol coverage. Given past medical history and time since last visit, based on ACC/AHA guidelines, ERNISHA SORN would be at acceptable risk for the planned procedure without further cardiovascular testing.   I will route this recommendation to the requesting party via Epic fax function and remove from pre-op pool.  Please call with questions.  Patient had cardiac catheterization yesterday for exertional chest pain and as part of preoperative clearance. Coronary anatomy stable, she is cleared to proceed with procedure after holding plavix for 5 days prior to the procedure.    Almyra Deforest, [place] 12/10/2017, 9:18 AM

## 2017-12-10 NOTE — Telephone Encounter (Signed)
Called patient, per Almyra Deforest, PA-C he stated that she was cleared for her procedure, and to hold plavix for 5 days prior. Also that we would send the information to who was doing the procedure. Patient verbalized understanding.

## 2017-12-10 NOTE — Telephone Encounter (Signed)
Patient informed to hold plavix per Dr. [name] 5 days prior to her procedure. Patient verbalized understanding.

## 2017-12-21 ENCOUNTER — Other Ambulatory Visit: Payer: Self-pay | Admitting: Cardiovascular Disease

## 2017-12-22 NOTE — Telephone Encounter (Signed)
Rx request sent to pharmacy.  

## 2017-12-28 ENCOUNTER — Telehealth: Payer: Self-pay | Admitting: Gastroenterology

## 2017-12-28 ENCOUNTER — Encounter (HOSPITAL_COMMUNITY): Payer: Self-pay | Admitting: Emergency Medicine

## 2017-12-28 ENCOUNTER — Other Ambulatory Visit: Payer: Self-pay

## 2017-12-28 NOTE — Telephone Encounter (Signed)
Patient thinks she has a cold or the oxygen she wears is drying out her nose. She has decided she does not need to cancel. She is afebrile. No cough or sore throat. Her back hurts but she feels this is due to other issues because it is not a new pain. She has remained on clear liquids.

## 2017-12-29 ENCOUNTER — Ambulatory Visit (HOSPITAL_COMMUNITY)
Admission: RE | Admit: 2017-12-29 | Discharge: 2017-12-29 | Disposition: A | Discharge status: Home / Self Care | Payer: Medicare HMO | Source: Ambulatory Visit | Attending: Gastroenterology | Admitting: Gastroenterology

## 2017-12-29 ENCOUNTER — Encounter (HOSPITAL_COMMUNITY): Payer: Self-pay | Admitting: Emergency Medicine

## 2017-12-29 ENCOUNTER — Ambulatory Visit (HOSPITAL_COMMUNITY): Payer: Medicare HMO | Admitting: Anesthesiology

## 2017-12-29 ENCOUNTER — Other Ambulatory Visit: Payer: Self-pay

## 2017-12-29 ENCOUNTER — Encounter (HOSPITAL_COMMUNITY)
Admission: RE | Disposition: A | Discharge status: Home / Self Care | Payer: Self-pay | Source: Ambulatory Visit | Attending: Gastroenterology

## 2017-12-29 DIAGNOSIS — Z8673 Personal history of transient ischemic attack (TIA), and cerebral infarction without residual deficits: Secondary | ICD-10-CM | POA: Diagnosis not present

## 2017-12-29 DIAGNOSIS — G4733 Obstructive sleep apnea (adult) (pediatric): Secondary | ICD-10-CM | POA: Diagnosis not present

## 2017-12-29 DIAGNOSIS — I4891 Unspecified atrial fibrillation: Secondary | ICD-10-CM | POA: Diagnosis not present

## 2017-12-29 DIAGNOSIS — K644 Residual hemorrhoidal skin tags: Secondary | ICD-10-CM | POA: Diagnosis not present

## 2017-12-29 DIAGNOSIS — K210 Gastro-esophageal reflux disease with esophagitis: Secondary | ICD-10-CM | POA: Diagnosis not present

## 2017-12-29 DIAGNOSIS — Z882 Allergy status to sulfonamides status: Secondary | ICD-10-CM | POA: Insufficient documentation

## 2017-12-29 DIAGNOSIS — R1310 Dysphagia, unspecified: Secondary | ICD-10-CM

## 2017-12-29 DIAGNOSIS — N189 Chronic kidney disease, unspecified: Secondary | ICD-10-CM | POA: Diagnosis not present

## 2017-12-29 DIAGNOSIS — Z951 Presence of aortocoronary bypass graft: Secondary | ICD-10-CM | POA: Insufficient documentation

## 2017-12-29 DIAGNOSIS — E1122 Type 2 diabetes mellitus with diabetic chronic kidney disease: Secondary | ICD-10-CM | POA: Diagnosis not present

## 2017-12-29 DIAGNOSIS — I255 Ischemic cardiomyopathy: Secondary | ICD-10-CM | POA: Diagnosis not present

## 2017-12-29 DIAGNOSIS — Z7902 Long term (current) use of antithrombotics/antiplatelets: Secondary | ICD-10-CM | POA: Insufficient documentation

## 2017-12-29 DIAGNOSIS — M1990 Unspecified osteoarthritis, unspecified site: Secondary | ICD-10-CM | POA: Insufficient documentation

## 2017-12-29 DIAGNOSIS — E7800 Pure hypercholesterolemia, unspecified: Secondary | ICD-10-CM | POA: Insufficient documentation

## 2017-12-29 DIAGNOSIS — K259 Gastric ulcer, unspecified as acute or chronic, without hemorrhage or perforation: Secondary | ICD-10-CM | POA: Diagnosis not present

## 2017-12-29 DIAGNOSIS — Z91041 Radiographic dye allergy status: Secondary | ICD-10-CM | POA: Insufficient documentation

## 2017-12-29 DIAGNOSIS — K2970 Gastritis, unspecified, without bleeding: Secondary | ICD-10-CM | POA: Insufficient documentation

## 2017-12-29 DIAGNOSIS — Z7951 Long term (current) use of inhaled steroids: Secondary | ICD-10-CM | POA: Insufficient documentation

## 2017-12-29 DIAGNOSIS — I2510 Atherosclerotic heart disease of native coronary artery without angina pectoris: Secondary | ICD-10-CM | POA: Insufficient documentation

## 2017-12-29 DIAGNOSIS — Z6837 Body mass index (BMI) 37.0-37.9, adult: Secondary | ICD-10-CM | POA: Insufficient documentation

## 2017-12-29 DIAGNOSIS — D120 Benign neoplasm of cecum: Secondary | ICD-10-CM | POA: Insufficient documentation

## 2017-12-29 DIAGNOSIS — Z7982 Long term (current) use of aspirin: Secondary | ICD-10-CM | POA: Insufficient documentation

## 2017-12-29 DIAGNOSIS — Z9981 Dependence on supplemental oxygen: Secondary | ICD-10-CM | POA: Insufficient documentation

## 2017-12-29 DIAGNOSIS — K648 Other hemorrhoids: Secondary | ICD-10-CM | POA: Insufficient documentation

## 2017-12-29 DIAGNOSIS — M109 Gout, unspecified: Secondary | ICD-10-CM | POA: Insufficient documentation

## 2017-12-29 DIAGNOSIS — K635 Polyp of colon: Secondary | ICD-10-CM

## 2017-12-29 DIAGNOSIS — K319 Disease of stomach and duodenum, unspecified: Secondary | ICD-10-CM | POA: Insufficient documentation

## 2017-12-29 DIAGNOSIS — K589 Irritable bowel syndrome without diarrhea: Secondary | ICD-10-CM | POA: Insufficient documentation

## 2017-12-29 DIAGNOSIS — K5730 Diverticulosis of large intestine without perforation or abscess without bleeding: Secondary | ICD-10-CM | POA: Diagnosis not present

## 2017-12-29 DIAGNOSIS — D123 Benign neoplasm of transverse colon: Secondary | ICD-10-CM | POA: Insufficient documentation

## 2017-12-29 DIAGNOSIS — K3189 Other diseases of stomach and duodenum: Secondary | ICD-10-CM | POA: Diagnosis not present

## 2017-12-29 DIAGNOSIS — K449 Diaphragmatic hernia without obstruction or gangrene: Secondary | ICD-10-CM | POA: Diagnosis not present

## 2017-12-29 DIAGNOSIS — K2960 Other gastritis without bleeding: Secondary | ICD-10-CM

## 2017-12-29 DIAGNOSIS — Z87891 Personal history of nicotine dependence: Secondary | ICD-10-CM | POA: Insufficient documentation

## 2017-12-29 DIAGNOSIS — J45909 Unspecified asthma, uncomplicated: Secondary | ICD-10-CM | POA: Insufficient documentation

## 2017-12-29 DIAGNOSIS — I509 Heart failure, unspecified: Secondary | ICD-10-CM | POA: Insufficient documentation

## 2017-12-29 DIAGNOSIS — I130 Hypertensive heart and chronic kidney disease with heart failure and stage 1 through stage 4 chronic kidney disease, or unspecified chronic kidney disease: Secondary | ICD-10-CM | POA: Insufficient documentation

## 2017-12-29 DIAGNOSIS — R195 Other fecal abnormalities: Secondary | ICD-10-CM | POA: Diagnosis not present

## 2017-12-29 DIAGNOSIS — Z955 Presence of coronary angioplasty implant and graft: Secondary | ICD-10-CM | POA: Insufficient documentation

## 2017-12-29 DIAGNOSIS — Z95810 Presence of automatic (implantable) cardiac defibrillator: Secondary | ICD-10-CM | POA: Insufficient documentation

## 2017-12-29 HISTORY — PX: POLYPECTOMY: SHX5525

## 2017-12-29 HISTORY — PX: BIOPSY: SHX5522

## 2017-12-29 HISTORY — PX: ESOPHAGOGASTRODUODENOSCOPY (EGD) WITH PROPOFOL: SHX5813

## 2017-12-29 HISTORY — PX: COLONOSCOPY WITH PROPOFOL: SHX5780

## 2017-12-29 LAB — GLUCOSE, CAPILLARY: Glucose-Capillary: 132 mg/dL — ABNORMAL HIGH (ref 70–99)

## 2017-12-29 SURGERY — COLONOSCOPY WITH PROPOFOL
Anesthesia: Monitor Anesthesia Care

## 2017-12-29 MED ORDER — PROPOFOL 10 MG/ML IV BOLUS
INTRAVENOUS | Status: AC
  Filled 2017-12-29: qty 60

## 2017-12-29 MED ORDER — LACTATED RINGERS IV SOLN
INTRAVENOUS | Status: DC
  Administered 2017-12-29: 1000 mL via INTRAVENOUS

## 2017-12-29 MED ORDER — PROPOFOL 500 MG/50ML IV EMUL
INTRAVENOUS | Status: DC | PRN
  Administered 2017-12-29: 125 ug/kg/min via INTRAVENOUS

## 2017-12-29 MED ORDER — PROPOFOL 10 MG/ML IV BOLUS
INTRAVENOUS | Status: DC | PRN
  Administered 2017-12-29 (×2): 20 mg via INTRAVENOUS

## 2017-12-29 SURGICAL SUPPLY — 24 items

## 2017-12-29 NOTE — Op Note (Addendum)
[facility] Patient Name: [name] Procedure Date: 12/29/2017 MRN: [id number] Attending MD: [name] , MD Date of Birth: [birth year] CSN: [id number] Age: 72 Admit Type: Inpatient Procedure:                Upper GI endoscopy Indications:              Dysphagia Providers:                [name], MD, Angus Seller, Alan Mulder, Technician, Derrek Gu Alday CRNA, CRNA Referring MD:              Medicines:                Monitored Anesthesia Care Complications:            No immediate complications. Estimated Blood Loss:     Estimated blood loss was minimal. Procedure:                Pre-Anesthesia Assessment:                           - Prior to the procedure, a History and Physical                            was performed, and patient medications and                            allergies were reviewed. The patient's tolerance of                            previous anesthesia was also reviewed. The risks                            and benefits of the procedure and the sedation                            options and risks were discussed with the patient.                            All questions were answered, and informed consent                            was obtained. Prior Anticoagulants: The patient                            last took aspirin 1 day and Plavix (clopidogrel) 5                            days prior to the procedure. ASA Grade Assessment:                            III - A patient with severe systemic disease. After  reviewing the risks and benefits, the patient was                            deemed in satisfactory condition to undergo the                            procedure.                           After obtaining informed consent, the endoscope was                            passed under direct vision. Throughout the                            procedure, the patient's  blood pressure, pulse, and                            oxygen saturations were monitored continuously. The                            GIF-H190 ([id number]) Olympus adult endoscope was                            introduced through the mouth, and advanced to the                            second part of duodenum. The upper GI endoscopy was                            accomplished without difficulty. The patient                            tolerated the procedure well. Scope In: Scope Out: Findings:      LA Grade B (one or more mucosal breaks greater than 5 mm, not extending       between the tops of two mucosal folds) esophagitis was found 35 cm from       the incisors.      A small hiatal hernia was present.      Patchy mild inflammation characterized by congestion (edema), erythema       and mucus was found in the gastric antrum. Biopsies were taken with a       cold forceps for Helicobacter pylori testing.      A single less than 1 mm mucosal papule (nodule) was found at the       pylorus. Biopsies were taken with a cold forceps for histology.      The examined duodenum was normal. Impression:               - LA Grade B reflux esophagitis.                           - Small hiatal hernia.                           - Gastritis.  Biopsied.                           - A single mucosal papule (nodule) found in the                            stomach. Biopsied.                           - Normal examined duodenum. Moderate Sedation:      Not Applicable - Patient had care per Anesthesia. Recommendation:           - Await pathology results.                           - See the other procedure note for documentation of                            additional recommendations. Procedure Code(s):        --- Professional ---                           [phone], Esophagogastroduodenoscopy, flexible,                            transoral; with biopsy, single or multiple Diagnosis Code(s):        --- Professional  ---                           K21.0, Gastro-esophageal reflux disease with                            esophagitis                           K44.9, Diaphragmatic hernia without obstruction or                            gangrene                           K29.70, Gastritis, unspecified, without bleeding                           K31.89, Other diseases of stomach and duodenum                           R13.10, Dysphagia, unspecified CPT copyright 2018 [employer]. All rights reserved. The codes documented in this report are preliminary and upon coder review may  be revised to meet current compliance requirements. [name], MD 12/29/2017 9:26:39 AM This report has been signed electronically. Number of Addenda: 0

## 2017-12-29 NOTE — Discharge Instructions (Signed)
Esophagogastroduodenoscopy, Care After °Refer to this sheet in the next few weeks. These instructions provide you with information about caring for yourself after your procedure. Your health care provider may also give you more specific instructions. Your treatment has been planned according to current medical practices, but problems sometimes occur. Call your health care provider if you have any problems or questions after your procedure. °What can I expect after the procedure? °After the procedure, it is common to have: °· A sore throat. °· Nausea. °· Bloating. °· Dizziness. °· Fatigue. ° °Follow these instructions at home: °· Do not eat or drink anything until the numbing medicine (local anesthetic) has worn off and your gag reflex has returned. You will know that the local anesthetic has worn off when you can swallow comfortably. °· Do not drive for 24 hours if you received a medicine to help you relax (sedative). °· If your health care provider took a tissue sample for testing during the procedure, make sure to get your test results. This is your responsibility. Ask your health care provider or the department performing the test when your results will be ready. °· Keep all follow-up visits as told by your health care provider. This is important. °Contact a health care provider if: °· You cannot stop coughing. °· You are not urinating. °· You are urinating less than usual. °Get help right away if: °· You have trouble swallowing. °· You cannot eat or drink. °· You have throat or chest pain that gets worse. °· You are dizzy or light-headed. °· You faint. °· You have nausea or vomiting. °· You have chills. °· You have a fever. °· You have severe abdominal pain. °· You have black, tarry, or bloody stools. °This information is not intended to replace advice given to you by your health care provider. Make sure you discuss any questions you have with your health care provider. °Document Released: 01/14/2012 Document  Revised: 07/05/2015 Document Reviewed: 12/21/2014 °Elsevier Interactive Patient Education © 2018 [employer] °Colonoscopy, Adult, Care After °This sheet gives you information about how to care for yourself after your procedure. Your doctor may also give you more specific instructions. If you have problems or questions, call your doctor. °Follow these instructions at home: °General instructions ° °· For the first 24 hours after the procedure: °? Do not drive or use machinery. °? Do not sign important documents. °? Do not drink alcohol. °? Do your daily activities more slowly than normal. °? Eat foods that are soft and easy to digest. °? Rest often. °· Take over-the-counter or prescription medicines only as told by your doctor. °· It is up to you to get the results of your procedure. Ask your doctor, or the department performing the procedure, when your results will be ready. °To help cramping and bloating: °· Try walking around. °· Put heat on your belly (abdomen) as told by your doctor. Use a heat source that your doctor recommends, such as a moist heat pack or a heating pad. °? Put a towel between your skin and the heat source. °? Leave the heat on for 20-30 minutes. °? Remove the heat if your skin turns bright red. This is especially important if you cannot feel pain, heat, or cold. You can get burned. °Eating and drinking °· Drink enough fluid to keep your pee (urine) clear or pale yellow. °· Return to your normal diet as told by your doctor. Avoid heavy or fried foods that are hard to digest. °· Avoid   drinking alcohol for as long as told by your doctor. °Contact a doctor if: °· You have blood in your poop (stool) 2-3 days after the procedure. °Get help right away if: °· You have more than a small amount of blood in your poop. °· You see large clumps of tissue (blood clots) in your poop. °· Your belly is swollen. °· You feel sick to your stomach (nauseous). °· You throw up (vomit). °· You have a fever. °· You  have belly pain that gets worse, and medicine does not help your pain. °This information is not intended to replace advice given to you by your health care provider. Make sure you discuss any questions you have with your health care provider. °Document Released: 03/01/2010 Document Revised: 10/22/2015 Document Reviewed: 10/22/2015 °Elsevier Interactive Patient Education © 2017 [employer] ° °

## 2017-12-29 NOTE — Anesthesia Postprocedure Evaluation (Signed)
Anesthesia Post Note  Patient: [name]  Procedure(s) Performed: COLONOSCOPY WITH PROPOFOL (N/A ) ESOPHAGOGASTRODUODENOSCOPY (EGD) WITH PROPOFOL (N/A ) BIOPSY     Patient location during evaluation: PACU Anesthesia Type: MAC Level of consciousness: awake and alert Pain management: pain level controlled Vital Signs Assessment: post-procedure vital signs reviewed and stable Respiratory status: spontaneous breathing, nonlabored ventilation, respiratory function stable and patient connected to nasal cannula oxygen Cardiovascular status: stable and blood pressure returned to baseline Postop Assessment: no apparent nausea or vomiting Anesthetic complications: no    Last Vitals:  Vitals:   12/29/17 0940 12/29/17 1000  BP: 96/73 119/73  Pulse: 65 62  Resp: (!) 26 18  Temp:    SpO2: 100% 95%    Last Pain:  Vitals:   12/29/17 1000  TempSrc:   PainSc: 0-No pain                 Effie Berkshire

## 2017-12-29 NOTE — H&P (Signed)
[facility] History and Physical   Primary Care Physician:  Darcus Austin, MD   Reason for Procedure:  Positive cologaurd, dysphagia  Plan:    EGD and colonoscopy with possible intervention    HPI: [name] is a 72 y.o. female with morbid obesity, CAD status post PCI, status post CABG X5, on aspirin and Plavix, A. fib, CHF, CKD here for colonoscopy for evaluation of positive cologaurd and EGD for evaluation of dysphagia.  Patient denies any overt GI bleeding.   Past Medical History:  Diagnosis Date  . AICD (automatic cardioverter/defibrillator) present   . Anemia   . Arthritis   . Asthma   . CAD (coronary artery disease)    a. 10/09/16 LHC: SVG->LAD patent, SVG->Diag patent, SVG->RCA patent, SVG->LCx occluded. EF 60%, b. 10/31/16 LHC DES to AV groove Circ, DES to intermed branch  . Cellulitis and abscess of foot 12/2014   RT FOOT  . CHF (congestive heart failure) ([place])   . Chronic bronchitis ([place])   . Chronic lower back pain   . Diverticulosis   . Facial numbness 02/2016  . Fatty liver disease, nonalcoholic   . Gastritis   . GERD (gastroesophageal reflux disease)   . Gout   . H/O hiatal hernia   . High cholesterol   . Hyperplastic colon polyp   . Hypertension   . IBS (irritable bowel syndrome)   . Internal hemorrhoids   . Ischemic cardiomyopathy   . Obesity   . On home oxygen therapy    "2L at night" (10/31/2016)  . OSA (obstructive sleep apnea)    "can't tolerate a mask" (10/30/2016)  . Pneumonia    "couple times" (10/31/2016)  . Shortness of breath   . TIA (transient ischemic attack)    "recently" (10/31/2016)  . Type II diabetes mellitus ([place])     Past Surgical History:  Procedure Laterality Date  . ABDOMINAL ULTRASOUND  12/01/2011   Peripelvic cysts- #1- 2.4x1.9x2.3cm, #2-1.2x0.9x1.2cm  . ANKLE FRACTURE SURGERY Right    "had rod put in"  . ANTERIOR CERVICAL DECOMP/DISCECTOMY FUSION    . APPENDECTOMY    . BACK SURGERY    . BIV ICD  INSERTION CRT-D  2001?  [name] BIV PACEMAKER GENERATOR CHANGE OUT N/A 11/02/2012   Procedure: BIV PACEMAKER GENERATOR CHANGE OUT;  Surgeon: Sanda Klein, MD;  Location: [facility] CATH LAB;  Service: Cardiovascular;  Laterality: N/A;  . CARDIAC CATHETERIZATION  05/17/1999   No significant coronary obstructive disease w/ mild 20% luminal irregularity of the first diag branch of the LAD  . CARDIAC CATHETERIZATION  07/08/2002   No significant CAD, moderately depressed LV systolic function  . CARDIAC CATHETERIZATION Bilateral 04/26/2007   Normal findings, recommend medical therapy  . CARDIAC CATHETERIZATION  02/18/2008   Moderate CAD, would benefit from having a functional study, recommend continue medical therapy  . CARDIAC CATHETERIZATION  07/23/2012   Medical therapy  . CARDIAC CATHETERIZATION N/A 11/24/2014   Procedure: Left Heart Cath and Coronary Angiography;  Surgeon: Troy Sine, MD;  Location: [facility];  Service: Cardiovascular;  Laterality: N/A;  . CARDIAC CATHETERIZATION  11/27/2014   Procedure: Intravascular Pressure Wire/FFR Study;  Surgeon: Peter M Martinique, MD;  Location: [place] [facility];  Service: Cardiovascular;;  . CARDIAC CATHETERIZATION  10/09/2016  . CORONARY ANGIOGRAM  2010  . CORONARY ARTERY BYPASS GRAFT N/A 11/29/2014   Procedure: CORONARY ARTERY BYPASS GRAFTING x 5 (LIMA-LAD, SVG-D, SVG-OM1-OM2, SVG-PD);  Surgeon: Melrose Nakayama, MD;  Location: [facility]  OR;  Service: Open Heart Surgery;  Laterality: N/A;  . CORONARY STENT INTERVENTION N/A 10/31/2016   Procedure: CORONARY STENT INTERVENTION;  Surgeon: Burnell Blanks, MD;  Location: [place] [facility] LAB;  Service: Cardiovascular;  Laterality: N/A;  . FRACTURE SURGERY    . INSERT / REPLACE / [place]  . KNEE ARTHROSCOPY Bilateral   . LEFT HEART CATH AND CORS/GRAFTS ANGIOGRAPHY N/A 12/09/2017   Procedure: LEFT HEART CATH AND CORS/GRAFTS ANGIOGRAPHY;  Surgeon: Troy Sine, MD;  Location: [place] [facility]  LAB;  Service: Cardiovascular;  Laterality: N/A;  . LEFT HEART CATHETERIZATION WITH CORONARY ANGIOGRAM N/A 07/23/2012   Procedure: LEFT HEART CATHETERIZATION WITH CORONARY ANGIOGRAM;  Surgeon: Leonie Man, MD;  Location: [facility] CATH LAB;  Service: Cardiovascular;  Laterality: N/A;  Carlton Adam MYOVIEW  11/14/2011   Mild-moderate defect seen in Mid Inferolateral and Mid Anterolateral regions-consistant w/ infarct/scar. No significant ischemia demonstrated.  [name] RIGHT/LEFT HEART CATH AND CORONARY ANGIOGRAPHY N/A 10/09/2016   Procedure: RIGHT/LEFT HEART CATH AND CORONARY ANGIOGRAPHY;  Surgeon: Jolaine Artist, MD;  Location: [facility] LAB;  Service: Cardiovascular;  Laterality: N/A;  . TEE WITHOUT CARDIOVERSION N/A 11/29/2014   Procedure: TRANSESOPHAGEAL ECHOCARDIOGRAM (TEE);  Surgeon: Melrose Nakayama, MD;  Location: [place];  Service: Open Heart Surgery;  Laterality: N/A;  . TRANSTHORACIC ECHOCARDIOGRAM  07/23/2012   EF 55-60%, normal-mild  . TUBAL LIGATION      Prior to Admission medications   Medication Sig Start Date End Date Taking? Authorizing Provider  acetaminophen (TYLENOL) 500 MG tablet Take 1,000 mg by mouth every 6 (six) hours as needed for mild pain.    Yes [provider]  albuterol (PROVENTIL) (2.5 MG/3ML) 0.083% nebulizer solution Take 2.5 mg by nebulization every 6 (six) hours as needed for wheezing or shortness of breath.   Yes [provider]  aspirin EC 81 MG tablet Take 1 tablet (81 mg total) by mouth daily. 03/27/16  Yes Kilroy, Luke K, PA-C  bisoprolol (ZEBETA) 5 MG tablet TAKE 1 TABLET EVERY DAY Patient taking differently: Take 5 mg by mouth daily.  11/10/17  Yes Kilroy, Luke K, PA-C  budesonide-formoterol (SYMBICORT) 160-4.5 MCG/ACT inhaler Inhale 2 puffs into the lungs 2 (two) times daily.   Yes [provider]  cetirizine (ZYRTEC) 10 MG tablet Take 10 mg by mouth at bedtime.  03/24/17  Yes [provider]  clopidogrel (PLAVIX) 75 MG  tablet TAKE 1 TABLET EVERY DAY Patient taking differently: Take 75 mg by mouth every evening.  09/08/17  Yes Croitoru, [name], MD  clotrimazole (LOTRIMIN) 1 % cream Apply 1 application topically 2 (two) times daily. Patient taking differently: Apply 1 application topically 2 (two) times daily as needed (rash).  07/20/17  Yes Lendon Colonel, NP  docusate sodium (COLACE) 100 MG capsule Take 200 mg by mouth at bedtime.    Yes [provider]  ezetimibe (ZETIA) 10 MG tablet Take 1 tablet (10 mg total) by mouth daily. 07/20/17 12/25/18 Yes Lendon Colonel, NP  febuxostat (ULORIC) 40 MG tablet Take 40 mg by mouth daily. 11/10/17  Yes [provider]  fluticasone (FLONASE) 50 MCG/ACT nasal spray Place 1 spray into both nostrils at bedtime as needed for rhinitis or allergies.    Yes [provider]  guaiFENesin (MUCINEX) 600 MG 12 hr tablet Take 600 mg by mouth at bedtime.    Yes [provider]  hydroxypropyl methylcellulose / hypromellose (ISOPTO TEARS / GONIOVISC) 2.5 %  ophthalmic solution Place 1 drop into both eyes 4 (four) times daily as needed for dry eyes.    Yes [provider]  ipratropium (ATROVENT) 0.02 % nebulizer solution Take 2.5 mLs (0.5 mg total) by nebulization 4 (four) times daily. Patient taking differently: Take 0.5 mg by nebulization every 6 (six) hours as needed for wheezing or shortness of breath.  09/23/17  Yes Carmon Sails J, PA-C  ipratropium (ATROVENT) 0.06 % nasal spray 1-2 pffs up to 4 x daily as needed Patient taking differently: Place 2 sprays into both nostrils 4 (four) times daily as needed for rhinitis.  10/16/16  Yes Tanda Rockers, MD  isosorbide mononitrate (IMDUR) 60 MG 24 hr tablet Take 1 tablet (60 mg total) by mouth daily. 06/08/17  Yes Croitoru, [name], MD  metolazone (ZAROXOLYN) 5 MG tablet Take 1 tablet (5 mg total) by mouth as needed. Take only as directed by [facility] Patient taking differently: Take 5  mg by mouth daily as needed (for weight gain of 3 LBS. or more).  07/21/16  Yes Eileen Stanford, PA-C  Multiple Vitamins-Minerals (ALIVE WOMENS ENERGY PO) Take 1 tablet by mouth daily.   Yes [provider]  ondansetron (ZOFRAN ODT) 4 MG disintegrating tablet Take 1 tablet (4 mg total) by mouth every 8 (eight) hours as needed. 04/18/17  Yes Isla Pence, MD  OXYGEN Inhale 2 L into the lungs See admin instructions. as needed for low oxygen levels & at bedtime each night.   Yes [provider]  pantoprazole (PROTONIX) 40 MG tablet TAKE 1 TABLET EVERY DAY Patient taking differently: Take 40 mg by mouth daily.  11/25/17  Yes Croitoru, [name], MD  potassium chloride SA (K-DUR,KLOR-CON) 20 MEQ tablet TAKE 1 TABLET EVERY DAY Patient taking differently: Take 20 mEq by mouth daily.  09/08/17  Yes Croitoru, [name], MD  RESTASIS 0.05 % ophthalmic emulsion Place 1 drop into both eyes 2 (two) times daily.  11/30/17  Yes [provider]  rosuvastatin (CRESTOR) 20 MG tablet Take 1 tablet (20 mg total) by mouth at bedtime. 12/22/17  Yes Croitoru, [name], MD  saxagliptin HCl (ONGLYZA) 2.5 MG TABS tablet Take 2.5 mg by mouth daily. Prescribed by Dr Inda Merlin per pharmacy 12/08/17  Yes Eulas Post, [place], PA  spironolactone (ALDACTONE) 25 MG tablet TAKE 1 TABLET EVERY DAY Patient taking differently: Take 25 mg by mouth at bedtime.  11/26/16  Yes Croitoru, [name], MD  torsemide (DEMADEX) 20 MG tablet TAKE 40 MG (2TAB) EVERY OTHER DAY ALTERNATING WITH 60 MG EVERY OTHER DAY Patient taking differently: Take 40-60 mg by mouth See admin instructions. TAKE 40 MG (2TAB) EVERY OTHER DAY ALTERNATING WITH 60 MG (2 tabs in am, 2 tabs in pm) EVERY OTHER DAY 08/18/17  Yes Bensimhon, Shaune Pascal, MD  triamcinolone cream (KENALOG) 0.1 % Apply 1 application topically 2 (two) times daily as needed (itching).  08/24/14  Yes [provider]  VENTOLIN HFA 108 (90 Base) MCG/ACT inhaler INHALE 2 PUFFS EVERY 4 HOURS AS NEEDED  FOR WHEEZING Patient taking differently: Inhale 2 puffs into the lungs every 4 (four) hours as needed for wheezing or shortness of breath.  07/10/16  Yes Rigoberto Noel, MD  EPINEPHrine (EPIPEN 2-PAK) 0.3 mg/0.3 mL IJ SOAJ injection Inject 0.3 mg into the muscle once as needed (severe allergic reaction).    [provider]  nitroGLYCERIN (NITROSTAT) 0.4 MG SL tablet Place 1 tablet (0.4 mg total) under the tongue every 5 (five) minutes as needed  for chest pain. 11/13/16 12/25/18  Erlene Quan, PA-C  predniSONE (DELTASONE) 50 MG tablet Take 1 tablet at 6pm the night before procedure, take 1 tablet at 12am, take 1 tablet at 6am. Patient not taking: Reported on 12/24/2017 12/03/17   Almyra Deforest, PA    Current Facility-Administered Medications  Medication Dose Route Frequency Provider Last Rate Last Dose  . lactated ringers infusion   Intravenous Continuous [name], MD 125 mL/hr at 12/29/17 0801 1,000 mL at 12/29/17 0801    Allergies as of 11/23/2017 - Review Complete 11/23/2017  Allergen Reaction Noted  . Ivp dye [iodinated diagnostic agents] Shortness Of Breath 10/13/2010  . Shellfish allergy Anaphylaxis 11/20/2017  . Shellfish-derived products Anaphylaxis 10/13/2010  . Sulfa antibiotics Shortness Of Breath   . Iodine Hives 10/13/2010  . Atorvastatin Other (See Comments) 11/12/2015  . Doxycycline  04/18/2017  . Cephalexin Itching and Rash 04/30/2015  . Zithromax [azithromycin] Rash 06/25/2014    Family History  Problem Relation Age of Onset  . Breast cancer Mother   . Diabetes Mother   . Heart disease Maternal Grandmother   . Kidney disease Maternal Grandmother   . Diabetes Maternal Grandmother   . Glaucoma Maternal Aunt   . Heart disease Maternal Aunt   . Asthma Sister     Social History   Socioeconomic History  . Marital status: Widowed    Spouse name: Not on file  . Number of children: 5  . Years of education: Not on file  . Highest education level: Not  on file  Occupational History  . Occupation: STAFF/BUFFET    Employer: [employer]  Social Needs  . Financial resource strain: Very hard  . Food insecurity:    Worry: Often true    Inability: Often true  . Transportation needs:    Medical: Not on file    Non-medical: Not on file  Tobacco Use  . Smoking status: Former Smoker    Packs/day: 0.25    Years: 3.00    Pack years: 0.75    Types: Cigarettes    Last attempt to quit: 02/11/1968    Years since quitting: 49.9  . Smokeless tobacco: Never Used  Substance and Sexual Activity  . Alcohol use: No  . Drug use: No  . Sexual activity: Never  Lifestyle  . Physical activity:    Days per week: 0 days    Minutes per session: 0 min  . Stress: Very much  Relationships  . Social connections:    Talks on phone: Not on file    Gets together: Not on file    Attends religious service: Not on file    Active member of club or organization: Not on file    Attends meetings of clubs or organizations: Not on file    Relationship status: Not on file  . Intimate partner violence:    Fear of current or ex partner: Not on file    Emotionally abused: Not on file    Physically abused: Not on file    Forced sexual activity: Not on file  Other Topics Concern  . Not on file  Social History Narrative   Widowed last year. She lives with her two sons.    Review of Systems:  All other review of systems negative except as mentioned in the HPI.  Physical Exam: Vital signs in last 24 hours: Temp:  [97.8 F (36.6 C)] 97.8 F (36.6 C) (11/19 0724) Pulse Rate:  [68] 68 (11/19 0724)  Resp:  [19] 19 (11/19 0724) BP: (113)/(65) 113/65 (11/19 0724) SpO2:  [94 %] 94 % (11/19 0724) Weight:  [105.7 kg] 105.7 kg (11/19 0724)   General:   Alert, morbidly obese, pleasant and cooperative in NAD Lungs:  Clear throughout to auscultation.   Heart:  Regular rate and rhythm; no murmurs, clicks, rubs,  or gallops. Abdomen:  Soft, nontender and  nondistended. Normal bowel sounds.   Neuro/Psych:  Alert and cooperative. Normal mood and affect. A and O x 3   K. Denzil Magnuson , MD [phone]

## 2017-12-29 NOTE — Anesthesia Preprocedure Evaluation (Addendum)
Anesthesia Evaluation  Patient identified by MRN, date of birth, ID band Patient awake    Reviewed: Allergy & Precautions, NPO status , Patient's Chart, lab work & pertinent test results, reviewed documented beta blocker date and time   Airway Mallampati: II  TM Distance: <3 FB Neck ROM: Full    Dental  (+) Upper Dentures, Edentulous Lower   Pulmonary asthma , sleep apnea , former smoker,     + decreased breath sounds      Cardiovascular hypertension, Pt. on home beta blockers + angina + CAD, + CABG and +CHF  + Cardiac Defibrillator  Rhythm:Regular Rate:Normal     Neuro/Psych TIA   GI/Hepatic hiatal hernia, GERD  Medicated,  Endo/Other  diabetes, Type 2, Oral Hypoglycemic Agents  Renal/GU      Musculoskeletal  (+) Arthritis ,   Abdominal (+) + obese,   Peds  Hematology negative hematology ROS (+)   Anesthesia Other Findings   Reproductive/Obstetrics                            LH Cath: Severe multivessel CAD with 60% narrowing proximally in the first diagonal branch of the LAD, 50% narrowing in the second branch with total occlusion of the of of the LAD after this proximal second diagonal vessel; widely patent ostial stent in the ramus intermediate vessel; widely patent stents in the left circumflex coronary artery; total proximal RCA occlusion.  Patent LIMA graft supplying the mid LAD.  Old occluded vein graft which sequentially had supplied the ramus intermediate and circumflex vessels.  Patent vein graft supplying the diagonal vessel.  Pain vein graft supplying the  PDA.  Low normal LV function with an EF of 50%; LVEDP 17 mmHg.  Anesthesia Physical Anesthesia Plan  ASA: III  Anesthesia Plan: MAC   Post-op Pain Management:    Induction: Intravenous  PONV Risk Score and Plan: 1 and Propofol infusion  Airway Management Planned: Natural Airway and Nasal  Cannula  Additional Equipment: None  Intra-op Plan:   Post-operative Plan:   Informed Consent: I have reviewed the patients History and Physical, chart, labs and discussed the procedure including the risks, benefits and alternatives for the proposed anesthesia with the patient or authorized representative who has indicated his/her understanding and acceptance.   Dental advisory given  Plan Discussed with: CRNA  Anesthesia Plan Comments:        Anesthesia Quick Evaluation

## 2017-12-29 NOTE — Transfer of Care (Signed)
Immediate Anesthesia Transfer of Care Note  Patient: [name]  Procedure(s) Performed: COLONOSCOPY WITH PROPOFOL (N/A ) ESOPHAGOGASTRODUODENOSCOPY (EGD) WITH PROPOFOL (N/A ) BIOPSY  Patient Location: PACU  Anesthesia Type:MAC  Level of Consciousness: sedated  Airway & Oxygen Therapy: Patient Spontanous Breathing and Patient connected to nasal cannula oxygen  Post-op Assessment: Report given to RN and Post -op Vital signs reviewed and stable  Post vital signs: Reviewed and stable  Last Vitals:  Vitals Value Taken Time  BP    Temp    Pulse    Resp    SpO2      Last Pain:  Vitals:   12/29/17 0724  TempSrc: Oral  PainSc: 0-No pain         Complications: No apparent anesthesia complications

## 2017-12-29 NOTE — Op Note (Addendum)
[facility] Patient Name: [name] Procedure Date: 12/29/2017 MRN: [id number] Attending MD: [name] , MD Date of Birth: November 19, 1945 CSN: [id number] Age: 72 Admit Type: Inpatient Procedure:                Colonoscopy Indications:              Positive Cologuard test Providers:                [name], MD, Angus Seller, Alan Mulder, Technician, Derrek Gu Alday CRNA, CRNA Referring MD:              Medicines:                Monitored Anesthesia Care Complications:            No immediate complications. Estimated Blood Loss:     Estimated blood loss was minimal. Procedure:                Pre-Anesthesia Assessment:                           - Prior to the procedure, a History and Physical                            was performed, and patient medications and                            allergies were reviewed. The patient's tolerance of                            previous anesthesia was also reviewed. The risks                            and benefits of the procedure and the sedation                            options and risks were discussed with the patient.                            All questions were answered, and informed consent                            was obtained. Prior Anticoagulants: The patient                            last took aspirin 1 day and Plavix (clopidogrel) 5                            days prior to the procedure. ASA Grade Assessment:                            III - A patient with severe systemic disease. After  reviewing the risks and benefits, the patient was                            deemed in satisfactory condition to undergo the                            procedure.                           After obtaining informed consent, the colonoscope                            was passed under direct vision. Throughout the                            procedure, the  patient's blood pressure, pulse, and                            oxygen saturations were monitored continuously. The                            PCF-[id number] ([id number] Olympus peds colonoscope was                            introduced through the anus and advanced to the the                            cecum, identified by appendiceal orifice and                            ileocecal valve. The colonoscopy was somewhat                            difficult due to the patient's body habitus. The                            patient tolerated the procedure well. The quality                            of the bowel preparation was good. The ileocecal                            valve, appendiceal orifice, and rectum were                            photographed. Scope In: 8:53:59 AM Scope Out: 9:16:37 AM Scope Withdrawal Time: 0 hours 20 minutes 12 seconds  Total Procedure Duration: 0 hours 22 minutes 38 seconds  Findings:      The perianal and digital rectal examinations were normal.      Two sessile polyps were found in the transverse colon and cecum. The       polyps were 11 to 14 mm in size. These polyps were removed with a hot       snare. Resection and retrieval were complete.  Two sessile polyps were found in the transverse colon and cecum. The       polyps were 1 to 2 mm in size. These polyps were removed with a cold       biopsy forceps. Resection and retrieval were complete.      Multiple small and large-mouthed diverticula were found in the sigmoid       colon, descending colon, transverse colon and ascending colon.      Non-bleeding external and internal hemorrhoids were found during       retroflexion. The hemorrhoids were medium-sized. Impression:               - Two 11 to 14 mm polyps in the transverse colon                            and in the cecum, removed with a hot snare.                            Resected and retrieved.                           - Two 1 to 2 mm polyps in the  transverse colon and                            in the cecum, removed with a cold biopsy forceps.                            Resected and retrieved.                           - Moderate diverticulosis in the sigmoid colon, in                            the descending colon, in the transverse colon and                            in the ascending colon.                           - Non-bleeding external and internal hemorrhoids. Moderate Sedation:      Not Applicable - Patient had care per Anesthesia. Recommendation:           - Patient has a contact number available for                            emergencies. The signs and symptoms of potential                            delayed complications were discussed with the                            patient. Return to normal activities tomorrow.                            Written discharge instructions were provided to the  patient.                           - Resume previous diet.                           - Continue present medications.                           - Await pathology results.                           - No ibuprofen, naproxen, or other non-steroidal                            anti-inflammatory drugs for 2 weeks.                           - Resume Plavix (clopidogrel) at prior dose in 2                            days. Refer to managing physician for further                            adjustment of therapy.                           - Repeat colonoscopy is recommended for adenoma                            surveillance in 3 years. The colonoscopy date will                            be determined after pathology results from today's                            exam become available for review. Procedure Code(s):        --- Professional ---                           [phone], Colonoscopy, flexible; with removal of                            tumor(s), polyp(s), or other lesion(s) by snare                             technique                           [id number], 73, Colonoscopy, flexible; with biopsy,                            single or multiple Diagnosis Code(s):        --- Professional ---                           D12.3, Benign neoplasm of  transverse colon (hepatic                            flexure or splenic flexure)                           D12.0, Benign neoplasm of cecum                           K64.8, Other hemorrhoids                           R19.5, Other fecal abnormalities                           K57.30, Diverticulosis of large intestine without                            perforation or abscess without bleeding CPT copyright 2018 American Medical Association. All rights reserved. The codes documented in this report are preliminary and upon coder review may  be revised to meet current compliance requirements. [name], MD 12/29/2017 9:32:16 AM This report has been signed electronically. Number of Addenda: 0

## 2017-12-30 ENCOUNTER — Encounter (HOSPITAL_COMMUNITY): Payer: Self-pay | Admitting: Gastroenterology

## 2017-12-30 ENCOUNTER — Ambulatory Visit: Payer: Medicare HMO | Admitting: Physician Assistant

## 2017-12-30 DIAGNOSIS — R269 Unspecified abnormalities of gait and mobility: Secondary | ICD-10-CM | POA: Diagnosis not present

## 2017-12-30 DIAGNOSIS — I10 Essential (primary) hypertension: Secondary | ICD-10-CM | POA: Diagnosis not present

## 2017-12-30 DIAGNOSIS — J4520 Mild intermittent asthma, uncomplicated: Secondary | ICD-10-CM | POA: Diagnosis not present

## 2017-12-30 DIAGNOSIS — J4530 Mild persistent asthma, uncomplicated: Secondary | ICD-10-CM | POA: Diagnosis not present

## 2017-12-30 DIAGNOSIS — I5032 Chronic diastolic (congestive) heart failure: Secondary | ICD-10-CM | POA: Diagnosis not present

## 2017-12-30 DIAGNOSIS — R0602 Shortness of breath: Secondary | ICD-10-CM | POA: Diagnosis not present

## 2017-12-30 DIAGNOSIS — J9611 Chronic respiratory failure with hypoxia: Secondary | ICD-10-CM | POA: Diagnosis not present

## 2017-12-31 ENCOUNTER — Encounter: Payer: Self-pay | Admitting: Gastroenterology

## 2018-01-02 ENCOUNTER — Telehealth: Payer: Self-pay | Admitting: Nurse Practitioner

## 2018-01-02 NOTE — Telephone Encounter (Signed)
Patient called with complaints of shortness of breath, has asthma and needed a breathing treatment yesterday. Today her breathing isn't much better. She had EGD / colonoscopy on 11/19 and wanted to make sure breathing difficulties were not procedure related.   Her procedures were done with MAC. No procedure complications noted on either report. She is several days out from the procedures so unlikely that SOB related to procedures. She had polypectomies but not having any GI bleeding.   Advised to go to ED if remain short of breath.

## 2018-01-04 DIAGNOSIS — K649 Unspecified hemorrhoids: Secondary | ICD-10-CM | POA: Diagnosis not present

## 2018-01-04 DIAGNOSIS — N183 Chronic kidney disease, stage 3 (moderate): Secondary | ICD-10-CM | POA: Diagnosis not present

## 2018-01-04 DIAGNOSIS — Z6841 Body Mass Index (BMI) 40.0 and over, adult: Secondary | ICD-10-CM | POA: Diagnosis not present

## 2018-01-04 DIAGNOSIS — E1122 Type 2 diabetes mellitus with diabetic chronic kidney disease: Secondary | ICD-10-CM | POA: Diagnosis not present

## 2018-01-04 DIAGNOSIS — K219 Gastro-esophageal reflux disease without esophagitis: Secondary | ICD-10-CM | POA: Diagnosis not present

## 2018-01-05 DIAGNOSIS — I5032 Chronic diastolic (congestive) heart failure: Secondary | ICD-10-CM | POA: Diagnosis not present

## 2018-01-05 DIAGNOSIS — R269 Unspecified abnormalities of gait and mobility: Secondary | ICD-10-CM | POA: Diagnosis not present

## 2018-01-05 DIAGNOSIS — J4520 Mild intermittent asthma, uncomplicated: Secondary | ICD-10-CM | POA: Diagnosis not present

## 2018-01-05 DIAGNOSIS — R0602 Shortness of breath: Secondary | ICD-10-CM | POA: Diagnosis not present

## 2018-01-05 DIAGNOSIS — I10 Essential (primary) hypertension: Secondary | ICD-10-CM | POA: Diagnosis not present

## 2018-01-05 DIAGNOSIS — J9611 Chronic respiratory failure with hypoxia: Secondary | ICD-10-CM | POA: Diagnosis not present

## 2018-01-11 ENCOUNTER — Telehealth: Payer: Self-pay | Admitting: Internal Medicine

## 2018-01-11 DIAGNOSIS — G4733 Obstructive sleep apnea (adult) (pediatric): Secondary | ICD-10-CM

## 2018-01-11 DIAGNOSIS — R0609 Other forms of dyspnea: Principal | ICD-10-CM

## 2018-01-11 DIAGNOSIS — R0600 Dyspnea, unspecified: Secondary | ICD-10-CM

## 2018-01-11 NOTE — Telephone Encounter (Signed)
Called and spoke with pt letting her know that MW stated we could send an order for humidifier for her to use with her O2. Pt expressed understanding. Order has been placed and sent to [facility].  Stated to pt we also needed to schedule a f/u appt but that MW stated we could wait until March 2020 for her to come in. Pt expressed understanding.  An appt has been scheduled for pt to see MW 04/12/2018 at 3pm. Nothing further needed.

## 2018-01-11 NOTE — Telephone Encounter (Signed)
I believe she means humidity and that's fine with me but  I haven't seen in over a year and also followed by Dr Elsworth Soho so needs f/u   by one of [place] to stay current on all of her pulmonary f/u - no need to come in over winter so ok to put off until march 2020

## 2018-01-11 NOTE — Telephone Encounter (Signed)
Unlikely to be related, it was done with MAC and dont recall any difficulty intra procedure or in the recovery

## 2018-01-11 NOTE — Telephone Encounter (Signed)
Called and spoke with patient, she stated that she is on regular oxygen use with nasal cannula at 2 liters per minute. She is wanting this to be changed to the mist instead as the oxygen dries her out and causes increased sinus infections. She stated that she was told she can have it changed to mist.   MW please advise, thank you.

## 2018-01-14 ENCOUNTER — Ambulatory Visit: Payer: Medicare HMO | Admitting: Physician Assistant

## 2018-01-14 NOTE — Progress Notes (Deleted)
Cardiology Office Note    Date:  01/14/2018   ID:  [name], DOB 10/25/1945, MRN [id number]  PCP:  Darcus Austin, MD  Cardiologist:  Dr. [name]  No chief complaint on file.   History of Present Illness:  [name] is a 72 y.o. female  with PMH of CAD s/p CABG 11/2014 (LIMA to LAD, SVG to D1, SVG to OM1 and OM2, SVG to PDA), chronic respiratory insufficiency on 2 L home O2, h/o diastolic heart failure, morbid obesity, adrenal insufficiency, type 2 diabetes mellitus, hypertension, stage IIICKD, asthmaandobstructive sleep apnea. She had a history of nonischemic cardiomyopathy and heart failure preceding the diagnosis of this CAD. She also had complete heart block and pacemaker placed in 2001, this was upgraded to biventricular ICD implanted in 2008 for severe cardiac myopathy with EF 20%,downgraded to CRT-P in 2014([employer])following excellent response from resynchronization with normalization of LV function. The current atrial and right ventricular leads were implanted in 2001, LV lead was implanted in 2008. The defibrillator leads implanted in 2008 was capped and its tip resected at the time of bypass surgery.  Last echocardiogram obtained on 10/24/2015 showed EF 55 to 60%, grade 1 DD.  She has very high atrial pacing threshold.  Device has been programmed to DDD.  Patient presented to the hospital in September 2018 with unstable angina.  Cardiac catheterization obtained on 10/09/2016 showed severe three-vessel CAD, patent LIMA to LAD, patent SVG to diagonal, patent SVG to RCA, SVG to left circumflex and OM were occluded ostially.  EF was 60%.  Right heart cath at the time showed normal wedge pressure and cardiac output.  Patient eventually and had DES x3 in the AV groove left circumflex and DES x1 in the intermediate branch on 10/31/2016.  She was placed on aspirin and Plavix.  Patient was last seen by Dr. [name] in April 2019, carvedilol has been switched to bisoprolol.   Patient was seen in June 2019 with multiple somatic complaints.  She also reports she has been taking extra doses of the torsemide which has been helping with her symptom.   No EKG    Past Medical History:  Diagnosis Date  . AICD (automatic cardioverter/defibrillator) present   . Anemia   . Arthritis   . Asthma   . CAD (coronary artery disease)    a. 10/09/16 LHC: SVG->LAD patent, SVG->Diag patent, SVG->RCA patent, SVG->LCx occluded. EF 60%, b. 10/31/16 LHC DES to AV groove Circ, DES to intermed branch  . Cellulitis and abscess of foot 12/2014   RT FOOT  . CHF (congestive heart failure) ([place])   . Chronic bronchitis ([place])   . Chronic lower back pain   . Diverticulosis   . Facial numbness 02/2016  . Fatty liver disease, nonalcoholic   . Gastritis   . GERD (gastroesophageal reflux disease)   . Gout   . H/O hiatal hernia   . High cholesterol   . Hyperplastic colon polyp   . Hypertension   . IBS (irritable bowel syndrome)   . Internal hemorrhoids   . Ischemic cardiomyopathy   . Obesity   . On home oxygen therapy    "2L at night" (10/31/2016)  . OSA (obstructive sleep apnea)    "can't tolerate a mask" (10/30/2016)  . Pneumonia    "couple times" (10/31/2016)  . Shortness of breath   . TIA (transient ischemic attack)    "recently" (10/31/2016)  . Type II diabetes mellitus [facility])     Past Surgical  History:  Procedure Laterality Date  . ABDOMINAL ULTRASOUND  12/01/2011   Peripelvic cysts- #1- 2.4x1.9x2.3cm, #2-1.2x0.9x1.2cm  . ANKLE FRACTURE SURGERY Right    "had rod put in"  . ANTERIOR CERVICAL DECOMP/DISCECTOMY FUSION    . APPENDECTOMY    . BACK SURGERY    . BIOPSY  12/29/2017   Procedure: BIOPSY;  Surgeon: [name], MD;  Location: [facility];  Service: Endoscopy;;  . BIV ICD INSERTION CRT-D  2001?  [name] BIV PACEMAKER GENERATOR CHANGE OUT N/A 11/02/2012   Procedure: BIV PACEMAKER GENERATOR CHANGE OUT;  Surgeon: Sanda Klein, MD;  Location: [place] CATH LAB;   Service: Cardiovascular;  Laterality: N/A;  . CARDIAC CATHETERIZATION  05/17/1999   No significant coronary obstructive disease w/ mild 20% luminal irregularity of the first diag branch of the LAD  . CARDIAC CATHETERIZATION  07/08/2002   No significant CAD, moderately depressed LV systolic function  . CARDIAC CATHETERIZATION Bilateral 04/26/2007   Normal findings, recommend medical therapy  . CARDIAC CATHETERIZATION  02/18/2008   Moderate CAD, would benefit from having a functional study, recommend continue medical therapy  . CARDIAC CATHETERIZATION  07/23/2012   Medical therapy  . CARDIAC CATHETERIZATION N/A 11/24/2014   Procedure: Left Heart Cath and Coronary Angiography;  Surgeon: Troy Sine, MD;  Location: [place] CV LAB;  Service: Cardiovascular;  Laterality: N/A;  . CARDIAC CATHETERIZATION  11/27/2014   Procedure: Intravascular Pressure Wire/FFR Study;  Surgeon: Peter M Martinique, MD;  Location: [place] CV LAB;  Service: Cardiovascular;;  . CARDIAC CATHETERIZATION  10/09/2016  . COLONOSCOPY WITH PROPOFOL N/A 12/29/2017   Procedure: COLONOSCOPY WITH PROPOFOL;  Surgeon: [name], MD;  Location: [facility];  Service: Endoscopy;  Laterality: N/A;  . CORONARY ANGIOGRAM  2010  . CORONARY ARTERY BYPASS GRAFT N/A 11/29/2014   Procedure: CORONARY ARTERY BYPASS GRAFTING x 5 (LIMA-LAD, SVG-D, SVG-OM1-OM2, SVG-PD);  Surgeon: Melrose Nakayama, MD;  Location: [place];  Service: Open Heart Surgery;  Laterality: N/A;  . CORONARY STENT INTERVENTION N/A 10/31/2016   Procedure: CORONARY STENT INTERVENTION;  Surgeon: Burnell Blanks, MD;  Location: [place] CV LAB;  Service: Cardiovascular;  Laterality: N/A;  . ESOPHAGOGASTRODUODENOSCOPY (EGD) WITH PROPOFOL N/A 12/29/2017   Procedure: ESOPHAGOGASTRODUODENOSCOPY (EGD) WITH PROPOFOL;  Surgeon: [name], MD;  Location: [facility];  Service: Endoscopy;  Laterality: N/A;  . FRACTURE SURGERY    . INSERT / REPLACE /  [place]  . KNEE ARTHROSCOPY Bilateral   . LEFT HEART CATH AND CORS/GRAFTS ANGIOGRAPHY N/A 12/09/2017   Procedure: LEFT HEART CATH AND CORS/GRAFTS ANGIOGRAPHY;  Surgeon: Troy Sine, MD;  Location: [place] CV LAB;  Service: Cardiovascular;  Laterality: N/A;  . LEFT HEART CATHETERIZATION WITH CORONARY ANGIOGRAM N/A 07/23/2012   Procedure: LEFT HEART CATHETERIZATION WITH CORONARY ANGIOGRAM;  Surgeon: Leonie Man, MD;  Location: [facility] CATH LAB;  Service: Cardiovascular;  Laterality: N/A;  Carlton Adam MYOVIEW  11/14/2011   Mild-moderate defect seen in Mid Inferolateral and Mid Anterolateral regions-consistant w/ infarct/scar. No significant ischemia demonstrated.  [name] POLYPECTOMY  12/29/2017   Procedure: POLYPECTOMY;  Surgeon: [name], MD;  [facility]: [facility];  Service: Endoscopy;;  . RIGHT/LEFT HEART CATH AND CORONARY ANGIOGRAPHY N/A 10/09/2016   Procedure: RIGHT/LEFT HEART CATH AND CORONARY ANGIOGRAPHY;  Surgeon: Jolaine Artist, MD;  Location: [place] CV LAB;  Service: Cardiovascular;  Laterality: N/A;  . TEE WITHOUT CARDIOVERSION N/A 11/29/2014   Procedure: TRANSESOPHAGEAL ECHOCARDIOGRAM (TEE);  Surgeon: Melrose Nakayama,  MD;  Location: [facility];  Service: Open Heart Surgery;  Laterality: N/A;  . TRANSTHORACIC ECHOCARDIOGRAM  07/23/2012   EF 55-60%, normal-mild  . TUBAL LIGATION      Current Medications: Outpatient Medications Prior to Visit  Medication Sig Dispense Refill  . acetaminophen (TYLENOL) 500 MG tablet Take 1,000 mg by mouth every 6 (six) hours as needed for mild pain.     [name] albuterol (PROVENTIL) (2.5 MG/3ML) 0.083% nebulizer solution Take 2.5 mg by nebulization every 6 (six) hours as needed for wheezing or shortness of breath.    [name] aspirin EC 81 MG tablet Take 1 tablet (81 mg total) by mouth daily. 90 tablet 3  . bisoprolol (ZEBETA) 5 MG tablet TAKE 1 TABLET EVERY DAY (Patient taking differently: Take 5 mg by mouth daily. ) 90 tablet 2  .  budesonide-formoterol (SYMBICORT) 160-4.5 MCG/ACT inhaler Inhale 2 puffs into the lungs 2 (two) times daily.    . cetirizine (ZYRTEC) 10 MG tablet Take 10 mg by mouth at bedtime.     . clopidogrel (PLAVIX) 75 MG tablet TAKE 1 TABLET EVERY DAY (Patient taking differently: Take 75 mg by mouth every evening. ) 90 tablet 2  . clotrimazole (LOTRIMIN) 1 % cream Apply 1 application topically 2 (two) times daily. (Patient taking differently: Apply 1 application topically 2 (two) times daily as needed (rash). ) 30 g 0  . docusate sodium (COLACE) 100 MG capsule Take 200 mg by mouth at bedtime.     [name] EPINEPHrine (EPIPEN 2-PAK) 0.3 mg/0.3 mL IJ SOAJ injection Inject 0.3 mg into the muscle once as needed (severe allergic reaction).    . ezetimibe (ZETIA) 10 MG tablet Take 1 tablet (10 mg total) by mouth daily. 90 tablet 3  . febuxostat (ULORIC) 40 MG tablet Take 40 mg by mouth daily.  3  . fluticasone (FLONASE) 50 MCG/ACT nasal spray Place 1 spray into both nostrils at bedtime as needed for rhinitis or allergies.     [name] guaiFENesin (MUCINEX) 600 MG 12 hr tablet Take 600 mg by mouth at bedtime.     . hydroxypropyl methylcellulose / hypromellose (ISOPTO TEARS / GONIOVISC) 2.5 % ophthalmic solution Place 1 drop into both eyes 4 (four) times daily as needed for dry eyes.     [name] ipratropium (ATROVENT) 0.02 % nebulizer solution Take 2.5 mLs (0.5 mg total) by nebulization 4 (four) times daily. (Patient taking differently: Take 0.5 mg by nebulization every 6 (six) hours as needed for wheezing or shortness of breath. ) 75 mL 12  . ipratropium (ATROVENT) 0.06 % nasal spray 1-2 pffs up to 4 x daily as needed (Patient taking differently: Place 2 sprays into both nostrils 4 (four) times daily as needed for rhinitis. ) 15 mL 12  . isosorbide mononitrate (IMDUR) 60 MG 24 hr tablet Take 1 tablet (60 mg total) by mouth daily. 180 tablet 3  . metolazone (ZAROXOLYN) 5 MG tablet Take 1 tablet (5 mg total) by mouth as needed. Take only  as directed by [facility] (Patient taking differently: Take 5 mg by mouth daily as needed (for weight gain of 3 LBS. or more). ) 5 tablet 0  . Multiple Vitamins-Minerals (ALIVE WOMENS ENERGY PO) Take 1 tablet by mouth daily.    . nitroGLYCERIN (NITROSTAT) 0.4 MG SL tablet Place 1 tablet (0.4 mg total) under the tongue every 5 (five) minutes as needed for chest pain. 25 tablet 3  . ondansetron (ZOFRAN ODT) 4 MG disintegrating tablet Take 1 tablet (  4 mg total) by mouth every 8 (eight) hours as needed. 10 tablet 0  . OXYGEN Inhale 2 L into the lungs See admin instructions. as needed for low oxygen levels & at bedtime each night.    . pantoprazole (PROTONIX) 40 MG tablet TAKE 1 TABLET EVERY DAY (Patient taking differently: Take 40 mg by mouth daily. ) 90 tablet 3  . potassium chloride SA (K-DUR,KLOR-CON) 20 MEQ tablet TAKE 1 TABLET EVERY DAY (Patient taking differently: Take 20 mEq by mouth daily. ) 90 tablet 2  . RESTASIS 0.05 % ophthalmic emulsion Place 1 drop into both eyes 2 (two) times daily.   11  . rosuvastatin (CRESTOR) 20 MG tablet Take 1 tablet (20 mg total) by mouth at bedtime. 90 tablet 0  . saxagliptin HCl (ONGLYZA) 2.5 MG TABS tablet Take 2.5 mg by mouth daily. Prescribed by Dr Inda Merlin per pharmacy    . spironolactone (ALDACTONE) 25 MG tablet TAKE 1 TABLET EVERY DAY (Patient taking differently: Take 25 mg by mouth at bedtime. ) 90 tablet 3  . torsemide (DEMADEX) 20 MG tablet TAKE 40 MG (2TAB) EVERY OTHER DAY ALTERNATING WITH 60 MG EVERY OTHER DAY (Patient taking differently: Take 40-60 mg by mouth See admin instructions. TAKE 40 MG (2TAB) EVERY OTHER DAY ALTERNATING WITH 60 MG (2 tabs in am, 2 tabs in pm) EVERY OTHER DAY) 225 tablet 3  . triamcinolone cream (KENALOG) 0.1 % Apply 1 application topically 2 (two) times daily as needed (itching).     . VENTOLIN HFA 108 (90 Base) MCG/ACT inhaler INHALE 2 PUFFS EVERY 4 HOURS AS NEEDED FOR WHEEZING (Patient taking differently: Inhale 2 puffs  into the lungs every 4 (four) hours as needed for wheezing or shortness of breath. ) 18 g 3   Facility-Administered Medications Prior to Visit  Medication Dose Route Frequency Provider Last Rate Last Dose  . diphenhydrAMINE (BENADRYL) injection 50 mg  50 mg Intravenous Once [place], Isaac Laud, [place]         Allergies:   Ivp dye [iodinated diagnostic agents]; Shellfish allergy; Shellfish-derived products; Sulfa antibiotics; Iodine; Atorvastatin; Doxycycline; Cephalexin; and Zithromax [azithromycin]   Social History   Socioeconomic History  . Marital status: Widowed    Spouse name: Not on file  . Number of children: 5  . Years of education: Not on file  . Highest education level: Not on file  Occupational History  . Occupation: STAFF/BUFFET    Employer: [employer]  Social Needs  . Financial resource strain: Very hard  . Food insecurity:    Worry: Often true    Inability: Often true  . Transportation needs:    Medical: Not on file    Non-medical: Not on file  Tobacco Use  . Smoking status: Former Smoker    Packs/day: 0.25    Years: 3.00    Pack years: 0.75    Types: Cigarettes    Last attempt to quit: 02/11/1968    Years since quitting: 49.9  . Smokeless tobacco: Never Used  Substance and Sexual Activity  . Alcohol use: No  . Drug use: No  . Sexual activity: Never  Lifestyle  . Physical activity:    Days per week: 0 days    Minutes per session: 0 min  . Stress: Very much  Relationships  . Social connections:    Talks on phone: Not on file    Gets together: Not on file    Attends religious service: Not on file    Active member  of club or organization: Not on file    Attends meetings of clubs or organizations: Not on file    Relationship status: Not on file  Other Topics Concern  . Not on file  Social History Narrative   Widowed last year. She lives with her two sons.     Family History:  The patient's ***family history includes Asthma in her sister; Breast  cancer in her mother; Diabetes in her maternal grandmother and mother; Glaucoma in her maternal aunt; Heart disease in her maternal aunt and maternal grandmother; Kidney disease in her maternal grandmother.   ROS:   Please see the history of present illness.    ROS All other systems reviewed and are negative.   PHYSICAL EXAM:   VS:  There were no vitals taken for this visit.   GEN: Well nourished, well developed, in no acute distress  HEENT: normal  Neck: no JVD, carotid bruits, or masses Cardiac: ***RRR; no murmurs, rubs, or gallops,no edema  Respiratory:  clear to auscultation bilaterally, normal work of breathing GI: soft, nontender, nondistended, + BS MS: no deformity or atrophy  Skin: warm and dry, no rash Neuro:  Alert and Oriented x 3, Strength and sensation are intact Psych: euthymic mood, full affect  Wt Readings from Last 3 Encounters:  12/29/17 233 lb (105.7 kg)  12/09/17 240 lb (108.9 kg)  12/03/17 253 lb (114.8 kg)      Studies/Labs Reviewed:   EKG:  EKG is*** ordered today.  The ekg ordered today demonstrates ***  Recent Labs: 09/23/2017: B Natriuretic Peptide 172.5 11/20/2017: ALT 20 12/03/2017: BUN 36; Creatinine, Ser 1.34; Hemoglobin 12.5; Platelets 199; Potassium 3.9; Sodium 143   Lipid Panel    Component Value Date/Time   CHOL 166 10/31/2016 0314   TRIG 181 (H) 10/31/2016 0314   HDL 39 (L) 10/31/2016 0314   CHOLHDL 4.3 10/31/2016 0314   VLDL 36 10/31/2016 0314   LDLCALC 91 10/31/2016 0314    Additional studies/ records that were reviewed today include:  ***    ASSESSMENT:    No diagnosis found.   PLAN:  In order of problems listed above:  1. ***    Medication Adjustments/Labs and Tests Ordered: Current medicines are reviewed at length with the patient today.  Concerns regarding medicines are outlined above.  Medication changes, Labs and Tests ordered today are listed in the Patient Instructions below. There are no Patient  Instructions on file for this visit.   Hilbert Corrigan, [place]  01/14/2018 1:59 PM    [facility] [address], [place], [place]  [address] Phone: [phone]; Fax: [phone]

## 2018-01-18 ENCOUNTER — Encounter: Payer: Self-pay | Admitting: *Deleted

## 2018-01-19 ENCOUNTER — Telehealth: Payer: Self-pay | Admitting: Gastroenterology

## 2018-01-19 NOTE — Telephone Encounter (Signed)
Reports nausea with any oral intake. Makes herself take in nourishment due to diabetes. She is taking Pantoprazole daily. Some reflux. No sick contacts. Please advise.

## 2018-01-20 NOTE — Telephone Encounter (Signed)
Spoke with the patient. She says nausea. Some pain in the left upper abdomen when she eats. No fried foods. She will take an extra Pantoprazole. Appointment for evaluation of the abdominal pain with nausea scheduled. Patient is concerned about her gall bladder.

## 2018-01-20 NOTE — Telephone Encounter (Signed)
She does have multiple gallstones, was considered high risk for cholecystectomy.  No signs of cholecystitis based on last CT scan.  Please schedule abdominal ultrasound

## 2018-01-20 NOTE — Telephone Encounter (Signed)
Pt is calling back and requesting a call back because she is in a lot of pain

## 2018-01-21 ENCOUNTER — Other Ambulatory Visit: Payer: Self-pay

## 2018-01-21 DIAGNOSIS — R1084 Generalized abdominal pain: Secondary | ICD-10-CM

## 2018-01-21 DIAGNOSIS — K8020 Calculus of gallbladder without cholecystitis without obstruction: Secondary | ICD-10-CM

## 2018-01-21 NOTE — Telephone Encounter (Signed)
Discussed with the patient. She will see Anderson Malta tomorrow to evaluate- abd u/s on 01/25/18 at 8:00 am

## 2018-01-22 ENCOUNTER — Ambulatory Visit (INDEPENDENT_AMBULATORY_CARE_PROVIDER_SITE_OTHER): Payer: Medicare HMO | Admitting: Physician Assistant

## 2018-01-22 ENCOUNTER — Encounter: Payer: Self-pay | Admitting: Physician Assistant

## 2018-01-22 ENCOUNTER — Other Ambulatory Visit (INDEPENDENT_AMBULATORY_CARE_PROVIDER_SITE_OTHER): Payer: Medicare HMO

## 2018-01-22 VITALS — BP 116/78 | HR 70 | Ht 66.0 in | Wt 256.4 lb

## 2018-01-22 DIAGNOSIS — R1013 Epigastric pain: Secondary | ICD-10-CM

## 2018-01-22 DIAGNOSIS — K5900 Constipation, unspecified: Secondary | ICD-10-CM

## 2018-01-22 DIAGNOSIS — R110 Nausea: Secondary | ICD-10-CM

## 2018-01-22 LAB — CBC WITH DIFFERENTIAL/PLATELET
Basophils Absolute: 0 10*3/uL (ref 0.0–0.1)
Basophils Relative: 0.6 % (ref 0.0–3.0)
Eosinophils Absolute: 0.2 10*3/uL (ref 0.0–0.7)
Eosinophils Relative: 2.8 % (ref 0.0–5.0)
HCT: 40 % (ref 36.0–46.0)
Hemoglobin: 12.8 g/dL (ref 12.0–15.0)
Lymphocytes Relative: 20.3 % (ref 12.0–46.0)
Lymphs Abs: 1.6 10*3/uL (ref 0.7–4.0)
MCHC: 31.9 g/dL (ref 30.0–36.0)
MCV: 88.9 fl (ref 78.0–100.0)
Monocytes Absolute: 0.7 10*3/uL (ref 0.1–1.0)
Monocytes Relative: 9.3 % (ref 3.0–12.0)
Neutro Abs: 5.4 10*3/uL (ref 1.4–7.7)
Neutrophils Relative %: 67 % (ref 43.0–77.0)
Platelets: 163 10*3/uL (ref 150.0–400.0)
RBC: 4.5 Mil/uL (ref 3.87–5.11)
RDW: 15 % (ref 11.5–15.5)
WBC: 8 10*3/uL (ref 4.0–10.5)

## 2018-01-22 LAB — COMPREHENSIVE METABOLIC PANEL
ALT: 19 U/L (ref 0–35)
AST: 15 U/L (ref 0–37)
Albumin: 3.5 g/dL (ref 3.5–5.2)
Alkaline Phosphatase: 88 U/L (ref 39–117)
BUN: 21 mg/dL (ref 6–23)
CO2: 35 mEq/L — ABNORMAL HIGH (ref 19–32)
Calcium: 9.1 mg/dL (ref 8.4–10.5)
Chloride: 100 mEq/L (ref 96–112)
Creatinine, Ser: 1.32 mg/dL — ABNORMAL HIGH (ref 0.40–1.20)
GFR: 41.84 mL/min — ABNORMAL LOW (ref >60.00)
Glucose, Bld: 139 mg/dL — ABNORMAL HIGH (ref 70–99)
Potassium: 3.9 mEq/L (ref 3.5–5.1)
Sodium: 141 mEq/L (ref 135–145)
Total Bilirubin: 0.3 mg/dL (ref 0.2–1.2)
Total Protein: 6.3 g/dL (ref 6.0–8.3)

## 2018-01-22 LAB — LIPASE: Lipase: 39 U/L (ref 11.0–59.0)

## 2018-01-22 MED ORDER — FAMOTIDINE 40 MG PO TABS: 40.0000 mg | ORAL_TABLET | Freq: Every day | ORAL | 3 refills | Status: DC

## 2018-01-22 MED ORDER — AMBULATORY NON FORMULARY MEDICATION: 0 refills | Status: DC

## 2018-01-22 NOTE — Progress Notes (Signed)
Chief Complaint: Epigastric pain and nausea  HPI:    [name] is a 72 year old female with a past medical history as listed below, known to Dr. Silverio Decamp, who presents to clinic today for complaint of epigastric pain and nausea.    12/29/2017 EGD and colonoscopy.  EGD with LA grade B reflux esophagitis, small hiatal hernia and gastritis.  Colonoscopy with two 11-14 mm polyps in the transverse colon and cecum, two 1-2 mm polyps in transverse colon and cecum, moderate diverticulosis in the sigmoid, descending and transverse colon as well as ascending colon with nonbleeding external and internal hemorrhoids.  Repeat colonoscopy is recommended in 3 years for adenoma surveillance.  Pathology showed tubular adenomas.    01/19/2018 patient called our clinic and described nausea.  She was concerned about her gallbladder.  Dr. Silverio Decamp relayed that she does have multiple gallstones but was considered high risk for cholecystectomy. There were no signs of cholecystitis based on last CT scan.  01/25/2018 ultrasound of the abdomen was ordered but never done.    Today, the patient explains that over the past couple of weeks she has developed an epigastric pain that feels like a knife is stabbing through to her back.  This is rated as an 8-9/10 at its worst.  She tells me that this occurs anytime that she eats or even drinks coffee.  Also describes feeling severe nausea but no vomiting.  Did start Zofran 4 mg as needed from her PCP which she is using daily.  Also continues with some reflux symptoms which are some better after increasing her Pantoprazole to 40 mg twice a day over the past week.  Patient tells me she is eating very little.    Also describes constipation since having her colonoscopy telling me that stools are harder to pass.  She already uses 2-3 stool softeners at night before going to bed.    Denies fever, chills, weight loss, blood in her stools or symptoms that awaken her from sleep.  Past  Medical History:  Diagnosis Date  . AICD (automatic cardioverter/defibrillator) present   . Anemia   . Arthritis   . Asthma   . CAD (coronary artery disease)    a. 10/09/16 LHC: SVG->LAD patent, SVG->Diag patent, SVG->RCA patent, SVG->LCx occluded. EF 60%, b. 10/31/16 LHC DES to AV groove Circ, DES to intermed branch  . Cellulitis and abscess of foot 12/2014   RT FOOT  . CHF (congestive heart failure) ([place])   . Chronic bronchitis ([place])   . Chronic lower back pain   . Diverticulosis   . Facial numbness 02/2016  . Fatty liver disease, nonalcoholic   . Gastritis   . GERD (gastroesophageal reflux disease)   . Gout   . H/O hiatal hernia   . High cholesterol   . Hyperplastic colon polyp   . Hypertension   . IBS (irritable bowel syndrome)   . Internal hemorrhoids   . Ischemic cardiomyopathy   . Obesity   . On home oxygen therapy    "2L at night" (10/31/2016)  . OSA (obstructive sleep apnea)    "can't tolerate a mask" (10/30/2016)  . Pneumonia    "couple times" (10/31/2016)  . Shortness of breath   . TIA (transient ischemic attack)    "recently" (10/31/2016)  . Type II diabetes mellitus ([place])     Past Surgical History:  Procedure Laterality Date  . ABDOMINAL ULTRASOUND  12/01/2011   Peripelvic cysts- #1- 2.4x1.9x2.3cm, #2-1.2x0.9x1.2cm  . ANKLE FRACTURE SURGERY Right    "  had rod put in"  . ANTERIOR CERVICAL DECOMP/DISCECTOMY FUSION    . APPENDECTOMY    . BACK SURGERY    . BIOPSY  12/29/2017   Procedure: BIOPSY;  Surgeon: [name], MD;  Location: [facility];  Service: Endoscopy;;  . BIV ICD INSERTION CRT-D  2001?  [name] BIV PACEMAKER GENERATOR CHANGE OUT N/A 11/02/2012   Procedure: BIV PACEMAKER GENERATOR CHANGE OUT;  Surgeon: Sanda Klein, MD;  Location: [facility];  Service: Cardiovascular;  Laterality: N/A;  . CARDIAC CATHETERIZATION  05/17/1999   No significant coronary obstructive disease w/ mild 20% luminal irregularity of the first diag branch of the LAD  .  CARDIAC CATHETERIZATION  07/08/2002   No significant CAD, moderately depressed LV systolic function  . CARDIAC CATHETERIZATION Bilateral 04/26/2007   Normal findings, recommend medical therapy  . CARDIAC CATHETERIZATION  02/18/2008   Moderate CAD, would benefit from having a functional study, recommend continue medical therapy  . CARDIAC CATHETERIZATION  07/23/2012   Medical therapy  . CARDIAC CATHETERIZATION N/A 11/24/2014   Procedure: Left Heart Cath and Coronary Angiography;  Surgeon: Troy Sine, MD;  Location: [facility];  Service: Cardiovascular;  Laterality: N/A;  . CARDIAC CATHETERIZATION  11/27/2014   Procedure: Intravascular Pressure Wire/FFR Study;  Surgeon: Peter M Martinique, MD;  Location: [facility];  Service: Cardiovascular;;  . CARDIAC CATHETERIZATION  10/09/2016  . COLONOSCOPY WITH PROPOFOL N/A 12/29/2017   Procedure: COLONOSCOPY WITH PROPOFOL;  Surgeon: [name], MD;  Location: [facility];  Service: Endoscopy;  Laterality: N/A;  . CORONARY ANGIOGRAM  2010  . CORONARY ARTERY BYPASS GRAFT N/A 11/29/2014   Procedure: CORONARY ARTERY BYPASS GRAFTING x 5 (LIMA-LAD, SVG-D, SVG-OM1-OM2, SVG-PD);  Surgeon: Melrose Nakayama, MD;  Location: [facility];  Service: Open Heart Surgery;  Laterality: N/A;  . CORONARY STENT INTERVENTION N/A 10/31/2016   Procedure: CORONARY STENT INTERVENTION;  Surgeon: Burnell Blanks, MD;  Location: [place] [facility];  Service: Cardiovascular;  Laterality: N/A;  . ESOPHAGOGASTRODUODENOSCOPY (EGD) WITH PROPOFOL N/A 12/29/2017   Procedure: ESOPHAGOGASTRODUODENOSCOPY (EGD) WITH PROPOFOL;  Surgeon: [name], MD;  [facility];  Service: Endoscopy;  Laterality: N/A;  . FRACTURE SURGERY    . INSERT / REPLACE / [place]  . KNEE ARTHROSCOPY Bilateral   . LEFT HEART CATH AND CORS/GRAFTS ANGIOGRAPHY N/A 12/09/2017   Procedure: LEFT HEART CATH AND CORS/GRAFTS ANGIOGRAPHY;  Surgeon: Troy Sine,  MD;  Location: [place];  Service: Cardiovascular;  Laterality: N/A;  . LEFT HEART CATHETERIZATION WITH CORONARY ANGIOGRAM N/A 07/23/2012   Procedure: LEFT HEART CATHETERIZATION WITH CORONARY ANGIOGRAM;  Surgeon: Leonie Man, MD;  Location: [facility] CATH LAB;  Service: Cardiovascular;  Laterality: N/A;  Carlton Adam MYOVIEW  11/14/2011   Mild-moderate defect seen in Mid Inferolateral and Mid Anterolateral regions-consistant w/ infarct/scar. No significant ischemia demonstrated.  [name] POLYPECTOMY  12/29/2017   Procedure: POLYPECTOMY;  Surgeon: [name], MD;  [facility];  Service: Endoscopy;;  . RIGHT/LEFT HEART CATH AND CORONARY ANGIOGRAPHY N/A 10/09/2016   Procedure: RIGHT/LEFT HEART CATH AND CORONARY ANGIOGRAPHY;  Surgeon: Jolaine Artist, MD;  Location: [facility];  Service: Cardiovascular;  Laterality: N/A;  . TEE WITHOUT CARDIOVERSION N/A 11/29/2014   Procedure: TRANSESOPHAGEAL ECHOCARDIOGRAM (TEE);  Surgeon: Melrose Nakayama, MD;  Location: [facility];  Service: Open Heart Surgery;  Laterality: N/A;  . TRANSTHORACIC ECHOCARDIOGRAM  07/23/2012   EF 55-60%, normal-mild  . TUBAL  LIGATION      Current Outpatient Medications  Medication Sig Dispense Refill  . acetaminophen (TYLENOL) 500 MG tablet Take 1,000 mg by mouth every 6 (six) hours as needed for mild pain.     [name] albuterol (PROVENTIL) (2.5 MG/3ML) 0.083% nebulizer solution Take 2.5 mg by nebulization every 6 (six) hours as needed for wheezing or shortness of breath.    [name] aspirin EC 81 MG tablet Take 1 tablet (81 mg total) by mouth daily. 90 tablet 3  . bisoprolol (ZEBETA) 5 MG tablet TAKE 1 TABLET EVERY DAY (Patient taking differently: Take 5 mg by mouth daily. ) 90 tablet 2  . budesonide-formoterol (SYMBICORT) 160-4.5 MCG/ACT inhaler Inhale 2 puffs into the lungs 2 (two) times daily.    . cetirizine (ZYRTEC) 10 MG tablet Take 10 mg by mouth at bedtime.     . clopidogrel (PLAVIX) 75 MG tablet TAKE 1  TABLET EVERY DAY (Patient taking differently: Take 75 mg by mouth every evening. ) 90 tablet 2  . clotrimazole (LOTRIMIN) 1 % cream Apply 1 application topically 2 (two) times daily. (Patient taking differently: Apply 1 application topically 2 (two) times daily as needed (rash). ) 30 g 0  . docusate sodium (COLACE) 100 MG capsule Take 200 mg by mouth at bedtime.     [name] EPINEPHrine (EPIPEN 2-PAK) 0.3 mg/0.3 mL IJ SOAJ injection Inject 0.3 mg into the muscle once as needed (severe allergic reaction).    . ezetimibe (ZETIA) 10 MG tablet Take 1 tablet (10 mg total) by mouth daily. 90 tablet 3  . febuxostat (ULORIC) 40 MG tablet Take 40 mg by mouth daily.  3  . fluticasone (FLONASE) 50 MCG/ACT nasal spray Place 1 spray into both nostrils at bedtime as needed for rhinitis or allergies.     [name] guaiFENesin (MUCINEX) 600 MG 12 hr tablet Take 600 mg by mouth at bedtime.     . hydroxypropyl methylcellulose / hypromellose (ISOPTO TEARS / GONIOVISC) 2.5 % ophthalmic solution Place 1 drop into both eyes 4 (four) times daily as needed for dry eyes.     [name] ipratropium (ATROVENT) 0.02 % nebulizer solution Take 2.5 mLs (0.5 mg total) by nebulization 4 (four) times daily. (Patient taking differently: Take 0.5 mg by nebulization every 6 (six) hours as needed for wheezing or shortness of breath. ) 75 mL 12  . ipratropium (ATROVENT) 0.06 % nasal spray 1-2 pffs up to 4 x daily as needed (Patient taking differently: Place 2 sprays into both nostrils 4 (four) times daily as needed for rhinitis. ) 15 mL 12  . isosorbide mononitrate (IMDUR) 60 MG 24 hr tablet Take 1 tablet (60 mg total) by mouth daily. 180 tablet 3  . metolazone (ZAROXOLYN) 5 MG tablet Take 1 tablet (5 mg total) by mouth as needed. Take only as directed by Heart Failure Clinic (Patient taking differently: Take 5 mg by mouth daily as needed (for weight gain of 3 LBS. or more). ) 5 tablet 0  . Multiple Vitamins-Minerals (ALIVE WOMENS ENERGY PO) Take 1 tablet by mouth  daily.    . nitroGLYCERIN (NITROSTAT) 0.4 MG SL tablet Place 1 tablet (0.4 mg total) under the tongue every 5 (five) minutes as needed for chest pain. 25 tablet 3  . ondansetron (ZOFRAN ODT) 4 MG disintegrating tablet Take 1 tablet (4 mg total) by mouth every 8 (eight) hours as needed. 10 tablet 0  . OXYGEN Inhale 2 L into the lungs See admin instructions. as needed for low oxygen  levels & at bedtime each night.    . pantoprazole (PROTONIX) 40 MG tablet TAKE 1 TABLET EVERY DAY (Patient taking differently: Take 40 mg by mouth daily. ) 90 tablet 3  . potassium chloride SA (K-DUR,KLOR-CON) 20 MEQ tablet TAKE 1 TABLET EVERY DAY (Patient taking differently: Take 20 mEq by mouth daily. ) 90 tablet 2  . RESTASIS 0.05 % ophthalmic emulsion Place 1 drop into both eyes 2 (two) times daily.   11  . rosuvastatin (CRESTOR) 20 MG tablet Take 1 tablet (20 mg total) by mouth at bedtime. 90 tablet 0  . saxagliptin HCl (ONGLYZA) 2.5 MG TABS tablet Take 2.5 mg by mouth daily. Prescribed by Dr Inda Merlin per pharmacy    . spironolactone (ALDACTONE) 25 MG tablet TAKE 1 TABLET EVERY DAY (Patient taking differently: Take 25 mg by mouth at bedtime. ) 90 tablet 3  . torsemide (DEMADEX) 20 MG tablet TAKE 40 MG (2TAB) EVERY OTHER DAY ALTERNATING WITH 60 MG EVERY OTHER DAY (Patient taking differently: Take 40-60 mg by mouth See admin instructions. TAKE 40 MG (2TAB) EVERY OTHER DAY ALTERNATING WITH 60 MG (2 tabs in am, 2 tabs in pm) EVERY OTHER DAY) 225 tablet 3  . triamcinolone cream (KENALOG) 0.1 % Apply 1 application topically 2 (two) times daily as needed (itching).     . VENTOLIN HFA 108 (90 Base) MCG/ACT inhaler INHALE 2 PUFFS EVERY 4 HOURS AS NEEDED FOR WHEEZING (Patient taking differently: Inhale 2 puffs into the lungs every 4 (four) hours as needed for wheezing or shortness of breath. ) 18 g 3   Current Facility-Administered Medications  Medication Dose Route Frequency Provider Last Rate Last Dose  . diphenhydrAMINE  (BENADRYL) injection 50 mg  50 mg Intravenous Once Almyra Deforest, [place]        Allergies as of 01/22/2018 - Review Complete 12/29/2017  Allergen Reaction Noted  . Ivp dye [iodinated diagnostic agents] Shortness Of Breath 10/13/2010  . Shellfish allergy Anaphylaxis 11/20/2017  . Shellfish-derived products Anaphylaxis 10/13/2010  . Sulfa antibiotics Shortness Of Breath   . Iodine Hives 10/13/2010  . Atorvastatin Other (See Comments) 11/12/2015  . Doxycycline  04/18/2017  . Cephalexin Itching and Rash 04/30/2015  . Zithromax [azithromycin] Rash 06/25/2014    Family History  Problem Relation Age of Onset  . Breast cancer Mother   . Diabetes Mother   . Heart disease Maternal Grandmother   . Kidney disease Maternal Grandmother   . Diabetes Maternal Grandmother   . Glaucoma Maternal Aunt   . Heart disease Maternal Aunt   . Asthma Sister   . Colon cancer Neg Hx   . Esophageal cancer Neg Hx     Social History   Socioeconomic History  . Marital status: Widowed    Spouse name: Not on file  . Number of children: 5  . Years of education: Not on file  . Highest education level: Not on file  Occupational History  . Occupation: STAFF/BUFFET    Employer: [employer]  Social Needs  . Financial resource strain: Very hard  . Food insecurity:    Worry: Often true    Inability: Often true  . Transportation needs:    Medical: Not on file    Non-medical: Not on file  Tobacco Use  . Smoking status: Former Smoker    Packs/day: 0.25    Years: 3.00    Pack years: 0.75    Types: Cigarettes    Last attempt to quit: 02/11/1968    Years  since quitting: 49.9  . Smokeless tobacco: Never Used  Substance and Sexual Activity  . Alcohol use: No  . Drug use: No  . Sexual activity: Never  Lifestyle  . Physical activity:    Days per week: 0 days    Minutes per session: 0 min  . Stress: Very much  Relationships  . Social connections:    Talks on phone: Not on file    Gets together: Not  on file    Attends religious service: Not on file    Active member of club or organization: Not on file    Attends meetings of clubs or organizations: Not on file    Relationship status: Not on file  . Intimate partner violence:    Fear of current or ex partner: Not on file    Emotionally abused: Not on file    Physically abused: Not on file    Forced sexual activity: Not on file  Other Topics Concern  . Not on file  Social History Narrative   Widowed last year. She lives with her two sons.    Review of Systems:    Constitutional: No weight loss, fever or chills Cardiovascular: No chest pain Respiratory: No SOB  Gastrointestinal: See HPI and otherwise negative   Physical Exam:  Vital signs: BP 116/78   Pulse 70   Ht 5\' 6"  (1.676 m)   Wt 256 lb 6 oz (116.3 kg)   BMI 41.38 kg/m   Constitutional:   Pleasant obese Caucasian female appears to be in NAD, Well developed, Well nourished, alert and cooperative Respiratory: Respirations even and unlabored. Lungs clear to auscultation bilaterally.   No wheezes, crackles, or rhonchi.  Cardiovascular: Normal S1, S2. No MRG. Regular rate and rhythm. No peripheral edema, cyanosis or pallor.  Gastrointestinal:  Soft, nondistended, moderate epigastric and LUQ ttp. No rebound or guarding. Normal bowel sounds. No appreciable masses or hepatomegaly. Rectal:  Not performed.  Psychiatric: Demonstrates good judgement and reason without abnormal affect or behaviors.  MOST RECENT LABS AND IMAGING: CBC    Component Value Date/Time   WBC 7.4 12/03/2017 1612   WBC 8.2 11/20/2017 1706   RBC 4.64 12/03/2017 1612   RBC 4.55 11/20/2017 1706   HGB 12.5 12/03/2017 1612   HCT 39.7 12/03/2017 1612   PLT 199 12/03/2017 1612   MCV 86 12/03/2017 1612   MCH 26.9 12/03/2017 1612   MCH 27.5 11/20/2017 1706   MCHC 31.5 12/03/2017 1612   MCHC 29.5 (L) 11/20/2017 1706   RDW 13.5 12/03/2017 1612   LYMPHSABS 1.9 03/04/2016 1824   MONOABS 0.5 03/04/2016 1824     EOSABS 0.2 03/04/2016 1824   BASOSABS 0.1 03/04/2016 1824    CMP     Component Value Date/Time   NA 143 12/03/2017 1612   K 3.9 12/03/2017 1612   CL 99 12/03/2017 1612   CO2 29 12/03/2017 1612   GLUCOSE 118 (H) 12/03/2017 1612   GLUCOSE 145 (H) 11/22/2017 0543   BUN 36 (H) 12/03/2017 1612   CREATININE 1.34 (H) 12/03/2017 1612   CREATININE 1.05 04/04/2014 1014   CALCIUM 9.2 12/03/2017 1612   PROT 6.9 11/20/2017 1706   ALBUMIN 3.5 11/20/2017 1706   AST 19 11/20/2017 1706   ALT 20 11/20/2017 1706   ALKPHOS 85 11/20/2017 1706   BILITOT 0.8 11/20/2017 1706   GFRNONAA 39 (L) 12/03/2017 1612   GFRAA 45 (L) 12/03/2017 1612    Assessment: 1.  Epigastric pain: Worse over the past few  weeks associated with nausea, some better with increasing Pantoprazole to twice daily, known gastritis, also known cholelithiasis, ultrasound has already been ordered; question worsening gastritis +/- cholecystitis 2.  Nausea: When eating; likely with above 3.  Constipation: Worse since time of colonoscopy per patient  Plan: 1.  Recommend the patient start MiraLAX once daily. 2.  Continue stool softeners at night. 3.  Ordered labs to include a CBC, CMP and Lipase. 4.  Patient has an abdominal ultrasound ordered for Monday already from our clinic. 5.  Encouraged patient continue Pantoprazole 40 mg twice daily, 30-60 minutes before breakfast and dinner.  Patient tells me she has enough of these at home and does not need a refill. 5.  Prescribed Pepcid 40 mg nightly #30 with 3 refills 6.  Prescribed GI cocktail 5-10 mL's every 4-6 hours as needed for severe epigastric pain 7.  Patient to follow in clinic with me or Dr. Silverio Decamp as recommended after labs and imaging above.  Ellouise Newer, PA-C [facility] 01/22/2018, 2:10 PM  Cc: Darcus Austin, MD

## 2018-01-22 NOTE — Patient Instructions (Addendum)
If you are age 73 or older, your body mass index should be between 23-30. Your Body mass index is 41.38 kg/m. If this is out of the aforementioned range listed, please consider follow up with your Primary Care Provider.  If you are age 65 or younger, your body mass index should be between 19-25. Your Body mass index is 41.38 kg/m. If this is out of the aformentioned range listed, please consider follow up with your Primary Care Provider.   Your provider has requested that you go to the basement level for lab work before leaving today. Press "B" on the elevator. The lab is located at the first door on the left as you exit the elevator.  We have sent the following medications to your pharmacy for you to pick up at your convenience: GI Cocktail Pepcid   Start Miralax daily.  Continue Pantoprazole 40 mg twice daily 30-60 minutes before breakfast and dinner.  Thank you for choosing me and [facility].   Ellouise Newer, PA-C

## 2018-01-25 ENCOUNTER — Ambulatory Visit (HOSPITAL_COMMUNITY)
Admission: RE | Admit: 2018-01-25 | Discharge: 2018-01-25 | Disposition: A | Discharge status: Home / Self Care | Payer: Medicare HMO | Source: Ambulatory Visit | Attending: Gastroenterology | Admitting: Gastroenterology

## 2018-01-25 DIAGNOSIS — R1084 Generalized abdominal pain: Secondary | ICD-10-CM | POA: Diagnosis not present

## 2018-01-25 DIAGNOSIS — N2889 Other specified disorders of kidney and ureter: Secondary | ICD-10-CM | POA: Insufficient documentation

## 2018-01-25 DIAGNOSIS — K8020 Calculus of gallbladder without cholecystitis without obstruction: Secondary | ICD-10-CM | POA: Diagnosis not present

## 2018-01-26 DIAGNOSIS — E1122 Type 2 diabetes mellitus with diabetic chronic kidney disease: Secondary | ICD-10-CM | POA: Diagnosis not present

## 2018-01-26 DIAGNOSIS — I5032 Chronic diastolic (congestive) heart failure: Secondary | ICD-10-CM | POA: Diagnosis not present

## 2018-01-26 DIAGNOSIS — N183 Chronic kidney disease, stage 3 (moderate): Secondary | ICD-10-CM | POA: Diagnosis not present

## 2018-01-26 DIAGNOSIS — J4520 Mild intermittent asthma, uncomplicated: Secondary | ICD-10-CM | POA: Diagnosis not present

## 2018-01-26 DIAGNOSIS — E1169 Type 2 diabetes mellitus with other specified complication: Secondary | ICD-10-CM | POA: Diagnosis not present

## 2018-01-26 DIAGNOSIS — I5033 Acute on chronic diastolic (congestive) heart failure: Secondary | ICD-10-CM | POA: Diagnosis not present

## 2018-01-26 DIAGNOSIS — I10 Essential (primary) hypertension: Secondary | ICD-10-CM | POA: Diagnosis not present

## 2018-01-26 DIAGNOSIS — I2510 Atherosclerotic heart disease of native coronary artery without angina pectoris: Secondary | ICD-10-CM | POA: Diagnosis not present

## 2018-01-26 IMAGING — DX DG CHEST 2V
2 series · 2 of 2 positions shown · non-contrast
Comparison: 10/30/2016

CLINICAL DATA: Chest pain /sob,strated this am,hx CAD,chf

EXAM:
CHEST  2 VIEW

[w chest pa]
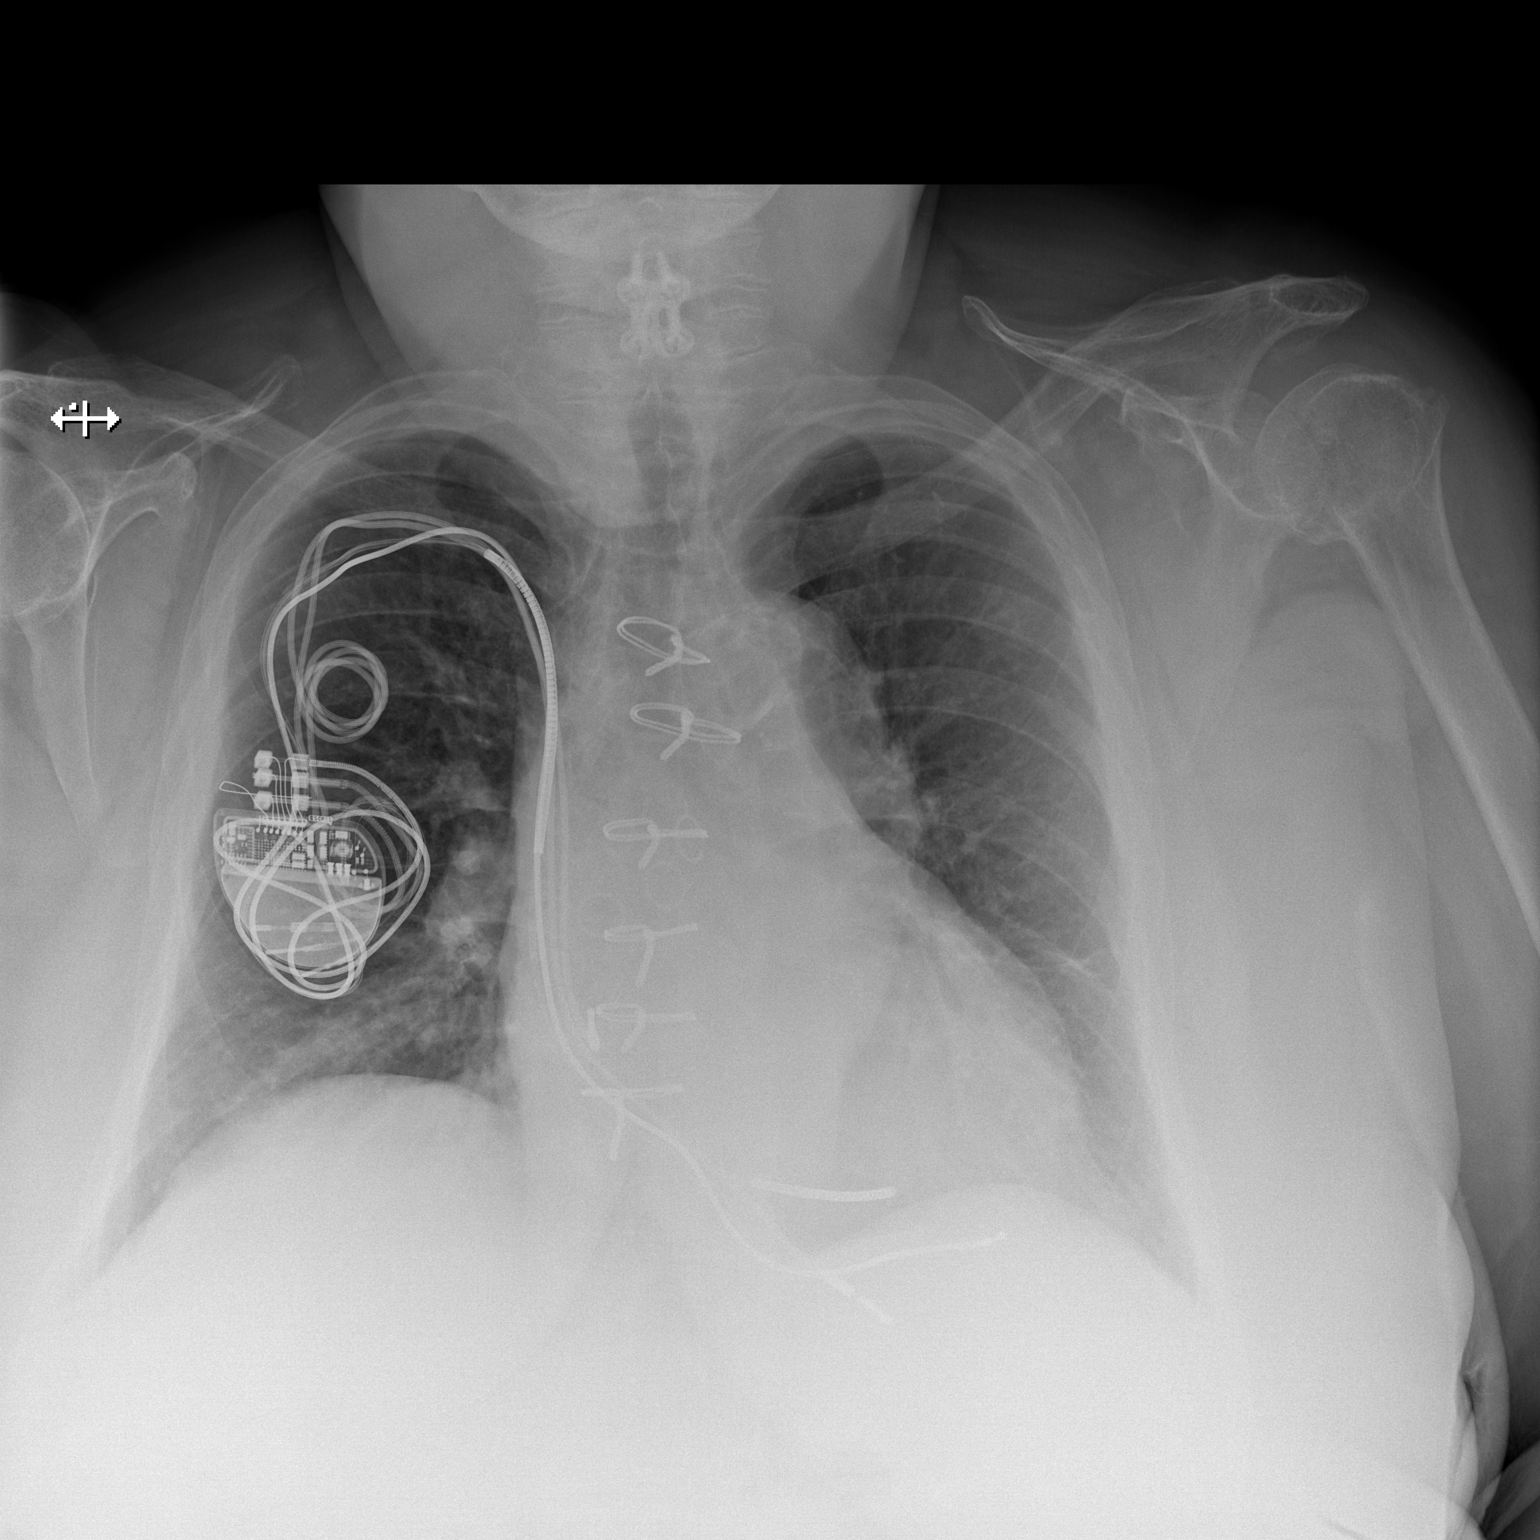

[w chest lat]
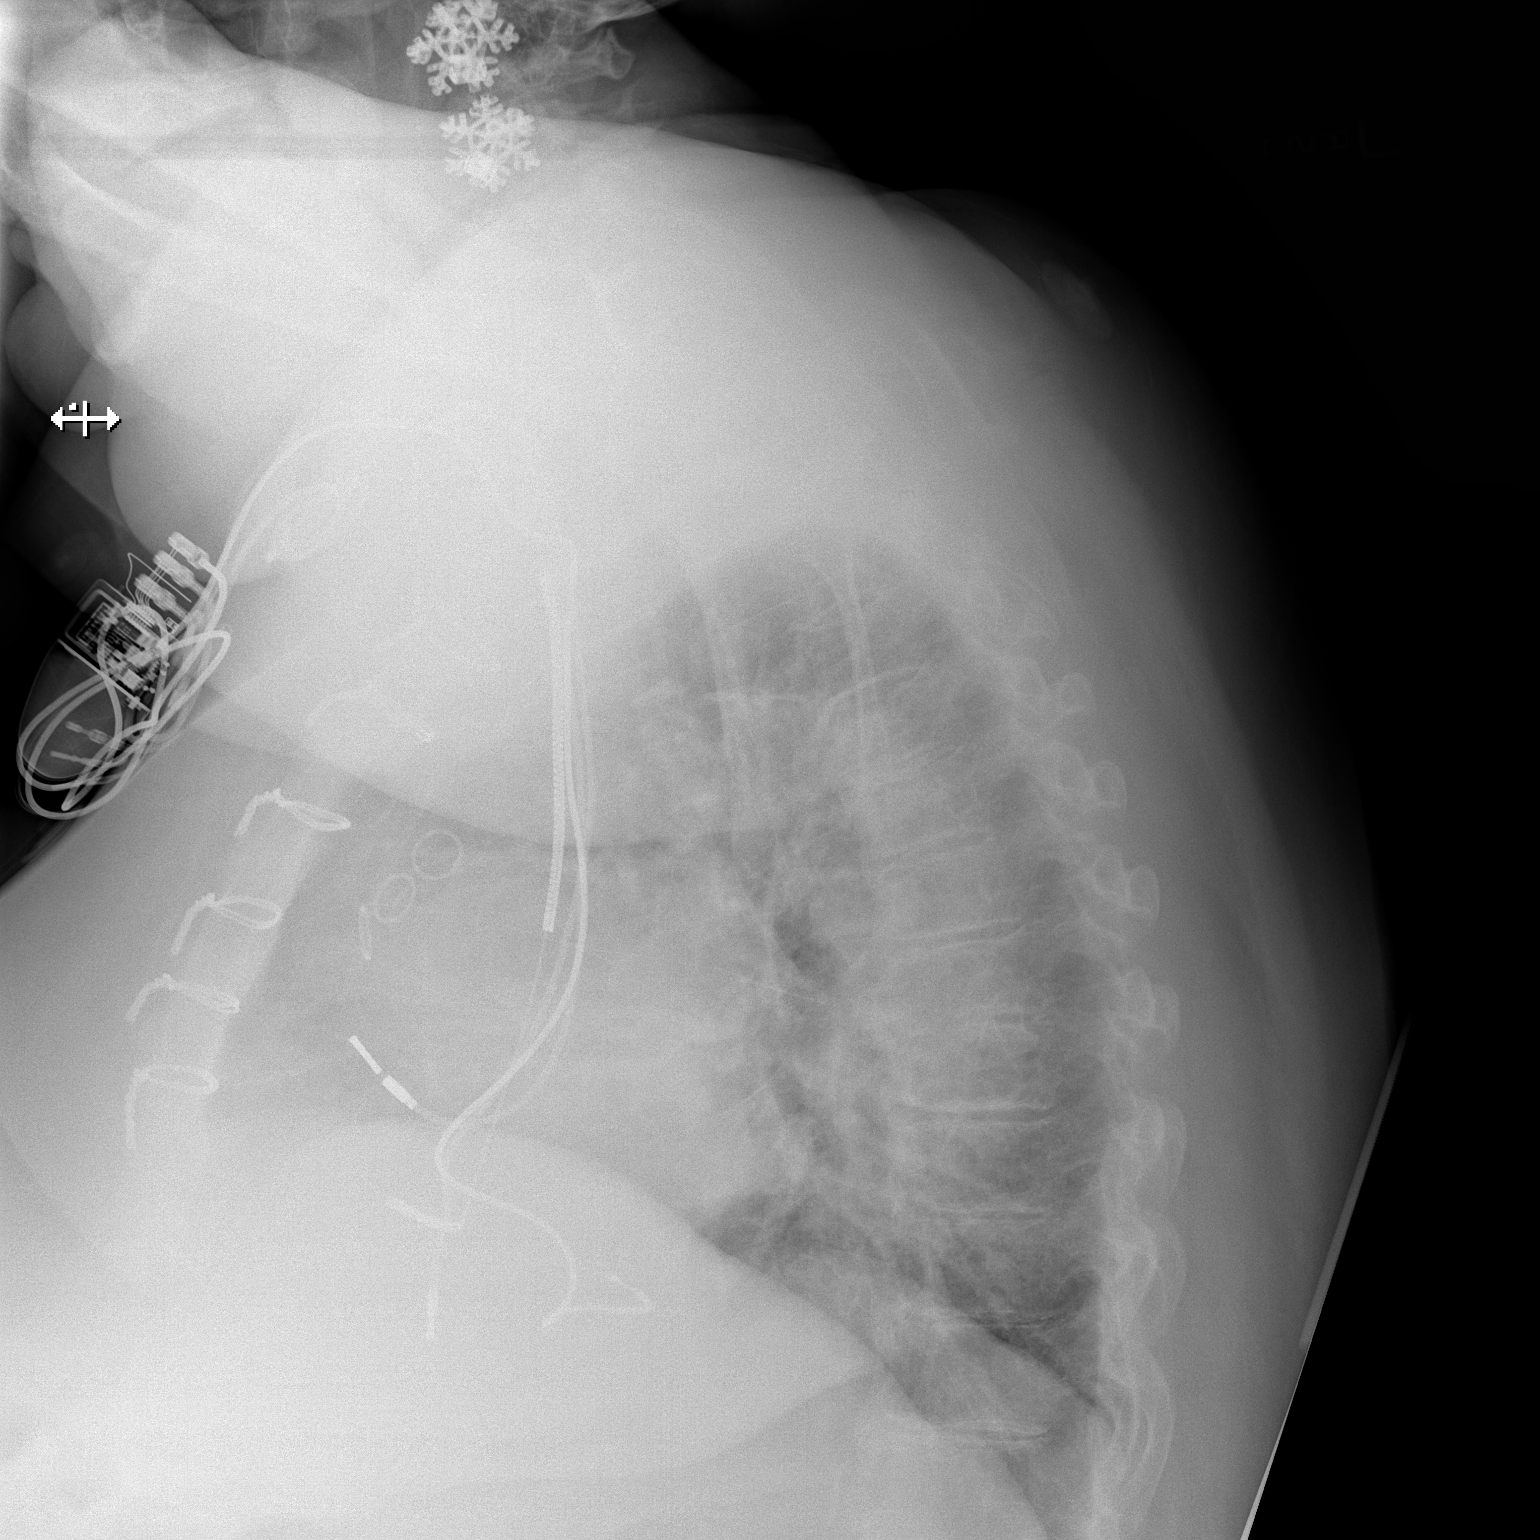

[2 of 2 positions shown; findings below may reference images not displayed]

FINDINGS: Status post CABG surgery. Cardiac silhouette is mildly enlarged. The
aorta is uncoiled and prominent. No mediastinal or hilar masses. No
evidence of adenopathy.

Right anterior chest wall AICD is stable.

Clear lungs.  No pleural effusion or pneumothorax.

Skeletal structures are demineralized but grossly intact.
IMPRESSION: No acute cardiopulmonary disease.

## 2018-01-28 DIAGNOSIS — I2510 Atherosclerotic heart disease of native coronary artery without angina pectoris: Secondary | ICD-10-CM | POA: Diagnosis not present

## 2018-01-28 DIAGNOSIS — I5032 Chronic diastolic (congestive) heart failure: Secondary | ICD-10-CM | POA: Diagnosis not present

## 2018-01-28 DIAGNOSIS — R109 Unspecified abdominal pain: Secondary | ICD-10-CM | POA: Diagnosis not present

## 2018-01-28 DIAGNOSIS — J9610 Chronic respiratory failure, unspecified whether with hypoxia or hypercapnia: Secondary | ICD-10-CM | POA: Diagnosis not present

## 2018-01-29 DIAGNOSIS — R0602 Shortness of breath: Secondary | ICD-10-CM | POA: Diagnosis not present

## 2018-01-29 DIAGNOSIS — R269 Unspecified abnormalities of gait and mobility: Secondary | ICD-10-CM | POA: Diagnosis not present

## 2018-01-29 DIAGNOSIS — I10 Essential (primary) hypertension: Secondary | ICD-10-CM | POA: Diagnosis not present

## 2018-01-29 DIAGNOSIS — J4530 Mild persistent asthma, uncomplicated: Secondary | ICD-10-CM | POA: Diagnosis not present

## 2018-01-29 DIAGNOSIS — I5032 Chronic diastolic (congestive) heart failure: Secondary | ICD-10-CM | POA: Diagnosis not present

## 2018-01-29 DIAGNOSIS — J9611 Chronic respiratory failure with hypoxia: Secondary | ICD-10-CM | POA: Diagnosis not present

## 2018-01-29 DIAGNOSIS — J4520 Mild intermittent asthma, uncomplicated: Secondary | ICD-10-CM | POA: Diagnosis not present

## 2018-02-04 DIAGNOSIS — J4520 Mild intermittent asthma, uncomplicated: Secondary | ICD-10-CM | POA: Diagnosis not present

## 2018-02-04 DIAGNOSIS — J9611 Chronic respiratory failure with hypoxia: Secondary | ICD-10-CM | POA: Diagnosis not present

## 2018-02-04 DIAGNOSIS — I5032 Chronic diastolic (congestive) heart failure: Secondary | ICD-10-CM | POA: Diagnosis not present

## 2018-02-04 DIAGNOSIS — R269 Unspecified abnormalities of gait and mobility: Secondary | ICD-10-CM | POA: Diagnosis not present

## 2018-02-04 DIAGNOSIS — R0602 Shortness of breath: Secondary | ICD-10-CM | POA: Diagnosis not present

## 2018-02-04 DIAGNOSIS — I10 Essential (primary) hypertension: Secondary | ICD-10-CM | POA: Diagnosis not present

## 2018-02-07 DIAGNOSIS — R0602 Shortness of breath: Secondary | ICD-10-CM | POA: Diagnosis not present

## 2018-02-07 DIAGNOSIS — J209 Acute bronchitis, unspecified: Secondary | ICD-10-CM | POA: Diagnosis not present

## 2018-02-12 ENCOUNTER — Telehealth: Payer: Self-pay

## 2018-02-12 DIAGNOSIS — K8020 Calculus of gallbladder without cholecystitis without obstruction: Secondary | ICD-10-CM | POA: Diagnosis not present

## 2018-02-12 DIAGNOSIS — I2510 Atherosclerotic heart disease of native coronary artery without angina pectoris: Secondary | ICD-10-CM | POA: Diagnosis not present

## 2018-02-12 DIAGNOSIS — I10 Essential (primary) hypertension: Secondary | ICD-10-CM | POA: Diagnosis not present

## 2018-02-12 DIAGNOSIS — Z7902 Long term (current) use of antithrombotics/antiplatelets: Secondary | ICD-10-CM | POA: Diagnosis not present

## 2018-02-12 DIAGNOSIS — G4730 Sleep apnea, unspecified: Secondary | ICD-10-CM | POA: Diagnosis not present

## 2018-02-12 NOTE — Telephone Encounter (Signed)
Primary Cardiologist: [facility] Pre-operative Risk Assessment    Request for surgical clearance:  1. What type of surgery is being performed? Laproscopic cholecystectomy   2. When is this surgery scheduled? TBD   3. What type of clearance is required (medical clearance vs. Pharmacy clearance to hold med vs. Both)? Both  4. Are there any medications that need to be held prior to surgery and how long? Plavix  5. Practice name and name of physician performing surgery? [facility]    6. What is your office phone number [phone]    7.   What is your office fax number [phone]  8.   Anesthesia type (None, local, MAC, general) ? General   [name] 02/12/2018, 4:40 PM  _________________________________________________________________   (provider comments below)

## 2018-02-14 ENCOUNTER — Other Ambulatory Visit: Payer: Self-pay

## 2018-02-14 ENCOUNTER — Emergency Department (HOSPITAL_COMMUNITY)
Admission: EM | Admit: 2018-02-14 | Discharge: 2018-02-14 | Disposition: A | Discharge status: Home / Self Care | Payer: Medicare HMO | Attending: Emergency Medicine | Admitting: Emergency Medicine

## 2018-02-14 ENCOUNTER — Encounter (HOSPITAL_COMMUNITY): Payer: Self-pay | Admitting: Emergency Medicine

## 2018-02-14 DIAGNOSIS — N183 Chronic kidney disease, stage 3 (moderate): Secondary | ICD-10-CM | POA: Diagnosis not present

## 2018-02-14 DIAGNOSIS — I2510 Atherosclerotic heart disease of native coronary artery without angina pectoris: Secondary | ICD-10-CM | POA: Insufficient documentation

## 2018-02-14 DIAGNOSIS — E1122 Type 2 diabetes mellitus with diabetic chronic kidney disease: Secondary | ICD-10-CM | POA: Insufficient documentation

## 2018-02-14 DIAGNOSIS — I4891 Unspecified atrial fibrillation: Secondary | ICD-10-CM | POA: Diagnosis not present

## 2018-02-14 DIAGNOSIS — E7800 Pure hypercholesterolemia, unspecified: Secondary | ICD-10-CM | POA: Diagnosis not present

## 2018-02-14 DIAGNOSIS — Z7982 Long term (current) use of aspirin: Secondary | ICD-10-CM | POA: Diagnosis not present

## 2018-02-14 DIAGNOSIS — R1010 Upper abdominal pain, unspecified: Secondary | ICD-10-CM | POA: Diagnosis present

## 2018-02-14 DIAGNOSIS — Z951 Presence of aortocoronary bypass graft: Secondary | ICD-10-CM | POA: Diagnosis not present

## 2018-02-14 DIAGNOSIS — Z87891 Personal history of nicotine dependence: Secondary | ICD-10-CM | POA: Diagnosis not present

## 2018-02-14 DIAGNOSIS — Z8673 Personal history of transient ischemic attack (TIA), and cerebral infarction without residual deficits: Secondary | ICD-10-CM | POA: Insufficient documentation

## 2018-02-14 DIAGNOSIS — I255 Ischemic cardiomyopathy: Secondary | ICD-10-CM | POA: Diagnosis not present

## 2018-02-14 DIAGNOSIS — J45909 Unspecified asthma, uncomplicated: Secondary | ICD-10-CM | POA: Insufficient documentation

## 2018-02-14 DIAGNOSIS — Z7902 Long term (current) use of antithrombotics/antiplatelets: Secondary | ICD-10-CM | POA: Diagnosis not present

## 2018-02-14 DIAGNOSIS — R1013 Epigastric pain: Secondary | ICD-10-CM | POA: Diagnosis not present

## 2018-02-14 DIAGNOSIS — I130 Hypertensive heart and chronic kidney disease with heart failure and stage 1 through stage 4 chronic kidney disease, or unspecified chronic kidney disease: Secondary | ICD-10-CM | POA: Diagnosis not present

## 2018-02-14 DIAGNOSIS — I5032 Chronic diastolic (congestive) heart failure: Secondary | ICD-10-CM | POA: Insufficient documentation

## 2018-02-14 DIAGNOSIS — Z79899 Other long term (current) drug therapy: Secondary | ICD-10-CM | POA: Insufficient documentation

## 2018-02-14 LAB — URINALYSIS, ROUTINE W REFLEX MICROSCOPIC
Bilirubin Urine: NEGATIVE
Glucose, UA: NEGATIVE mg/dL
Hgb urine dipstick: NEGATIVE
Ketones, ur: NEGATIVE mg/dL
Nitrite: NEGATIVE
Protein, ur: NEGATIVE mg/dL
Specific Gravity, Urine: 1.016 (ref 1.005–1.030)
pH: 5 (ref 5.0–8.0)

## 2018-02-14 LAB — COMPREHENSIVE METABOLIC PANEL
ALT: 24 U/L (ref 0–44)
AST: 18 U/L (ref 15–41)
Albumin: 3 g/dL — ABNORMAL LOW (ref 3.5–5.0)
Alkaline Phosphatase: 64 U/L (ref 38–126)
Anion gap: 7 (ref 5–15)
BUN: 23 mg/dL (ref 8–23)
CO2: 33 mmol/L — ABNORMAL HIGH (ref 22–32)
Calcium: 9.2 mg/dL (ref 8.9–10.3)
Chloride: 100 mmol/L (ref 98–111)
Creatinine, Ser: 1.42 mg/dL — ABNORMAL HIGH (ref 0.44–1.00)
GFR calc Af Amer: 42 mL/min — ABNORMAL LOW (ref >60)
GFR calc non Af Amer: 37 mL/min — ABNORMAL LOW (ref >60)
Glucose, Bld: 172 mg/dL — ABNORMAL HIGH (ref 70–99)
Potassium: 3.7 mmol/L (ref 3.5–5.1)
Sodium: 140 mmol/L (ref 135–145)
Total Bilirubin: 0.5 mg/dL (ref 0.3–1.2)
Total Protein: 6.5 g/dL (ref 6.5–8.1)

## 2018-02-14 LAB — CBC
HCT: 45 % (ref 36.0–46.0)
Hemoglobin: 13.7 g/dL (ref 12.0–15.0)
MCH: 28.2 pg (ref 26.0–34.0)
MCHC: 30.4 g/dL (ref 30.0–36.0)
MCV: 92.6 fL (ref 80.0–100.0)
Platelets: 177 10*3/uL (ref 150–400)
RBC: 4.86 MIL/uL (ref 3.87–5.11)
RDW: 13.7 % (ref 11.5–15.5)
WBC: 8.5 10*3/uL (ref 4.0–10.5)
nRBC: 0 % (ref 0.0–0.2)

## 2018-02-14 LAB — LIPASE, BLOOD: Lipase: 51 U/L (ref 11–51)

## 2018-02-14 MED ORDER — ONDANSETRON 4 MG PO TBDP
4.0000 mg | ORAL_TABLET | Freq: Once | ORAL | Status: AC
  Administered 2018-02-14: 4 mg via ORAL
  Filled 2018-02-14: qty 1

## 2018-02-14 MED ORDER — MORPHINE SULFATE (PF) 4 MG/ML IV SOLN
4.0000 mg | Freq: Once | INTRAVENOUS | Status: AC
  Administered 2018-02-14: 4 mg via INTRAMUSCULAR

## 2018-02-14 MED ORDER — MORPHINE SULFATE (PF) 4 MG/ML IV SOLN
4.0000 mg | Freq: Once | INTRAVENOUS | Status: DC
  Filled 2018-02-14: qty 1

## 2018-02-14 MED ORDER — ONDANSETRON HCL 4 MG/2ML IJ SOLN
4.0000 mg | Freq: Once | INTRAMUSCULAR | Status: DC
  Filled 2018-02-14: qty 2

## 2018-02-14 NOTE — ED Notes (Signed)
Found one small bed bug crawling on patient's purse. She denies having them at home and thinks it came from setting her purse on our floor.

## 2018-02-14 NOTE — ED Notes (Addendum)
Pt. Asked if she is able to give sample at this time. Pt. Stated no and is aware that we need UA

## 2018-02-14 NOTE — ED Provider Notes (Signed)
[facility] EMERGENCY DEPARTMENT Provider Note   CSN: [id number] Arrival date & time: 02/14/18  1413     History   Chief Complaint Chief Complaint  Patient presents with  . Abdominal Pain    HPI [name] is a 73 y.o. female.  HPI Patient presents emergency room for evaluation of abdominal pain.  Patient has been having difficulty with upper abdominal pain for at least a couple of months.  She sees [facility].  Patient had an endoscopy and a colonoscopy in November of last year.  She was diagnosed with esophagitis, gastritis and a hiatal hernia.  Patient continue to have episodes of nausea and abdominal pain.  She did not have an ultrasound.  The ultrasound showed evidence of gallstones without cholecystitis.  Patient has seen general surgery and the plan is for a cholecystectomy pending preoperative clearance.  Patient continues to take antacid medications.  She started having pain in her upper abdomen again this morning that radiates to her back.  He has had nausea but no vomiting.  Patient states she was getting ready to go to church but the pain was too severe so that is why she came to the ED.  No fevers.  No chest pain or shortness of breath. Past Medical History:  Diagnosis Date  . AICD (automatic cardioverter/defibrillator) present   . Anemia   . Arthritis   . Asthma   . CAD (coronary artery disease)    a. 10/09/16 LHC: SVG->LAD patent, SVG->Diag patent, SVG->RCA patent, SVG->LCx occluded. EF 60%, b. 10/31/16 LHC DES to AV groove Circ, DES to intermed branch  . Cellulitis and abscess of foot 12/2014   RT FOOT  . CHF (congestive heart failure) ([place])   . Chronic bronchitis ([place])   . Chronic lower back pain   . Diverticulosis   . Facial numbness 02/2016  . Fatty liver disease, nonalcoholic   . Gastritis   . GERD (gastroesophageal reflux disease)   . Gout   . H/O hiatal hernia   . High cholesterol   . Hyperplastic colon polyp   .  Hypertension   . IBS (irritable bowel syndrome)   . Internal hemorrhoids   . Ischemic cardiomyopathy   . Obesity   . On home oxygen therapy    "2L at night" (10/31/2016)  . OSA (obstructive sleep apnea)    "can't tolerate a mask" (10/30/2016)  . Pneumonia    "couple times" (10/31/2016)  . Shortness of breath   . TIA (transient ischemic attack)    "recently" (10/31/2016)  . Type II diabetes mellitus [facility])     Patient Active Problem List   Diagnosis Date Noted  . Polyp of cecum   . Polyp of transverse colon   . Positive colorectal cancer screening using Cologuard test   . Dysphagia   . Acute on chronic renal failure ([place]) 11/20/2017  . CAD (coronary artery disease) 11/20/2017  . DM2 (diabetes mellitus, type 2) ([place]) 11/20/2017  . Epigastric abdominal pain 11/20/2017  . Vasomotor rhinitis 10/16/2016  . Right facial numbness 03/05/2016  . CKD (chronic kidney disease), stage III ([place]) 03/05/2016  . Atrial fibrillation ([place]) 10/24/2015  . Chest pain with moderate risk of acute coronary syndrome 10/23/2015  . History of TIA (transient ischemic attack) 10/22/2015  . Complete heart block ([place]) 06/22/2015  . Allergic drug rash due to anti-infective agent 04/29/2015  . Fatigue 02/14/2015  . Chronic diastolic heart failure ([place]) 02/14/2015  . Cellulitis 12/21/2014  . S/P  CABG x 5 11/29/2014  . CAD S/P percutaneous coronary angioplasty   . Diarrhea 07/12/2014  . Generalized abdominal pain 07/12/2014  . Diastolic CHF, acute on chronic (HCC) 11/04/2013  . Mixed hypercholesterolemia and hypertriglyceridemia 04/22/2013  . Vertigo 04/22/2013  . Dyspnea on exertion 08/25/2012  . Allergic to IV contrast 07/23/2012  . Unstable angina ([place]) 07/22/2012  . Morbid (severe) obesity due to excess calories ([place]) 11/22/2011  . Nonischemic cardiomyopathy ([place]) 11/22/2011  . GERD (gastroesophageal reflux disease) 11/22/2011  . Back pain 11/22/2011  . OSA (obstructive sleep apnea) 11/22/2011  .  HEMORRHOIDS-EXTERNAL 02/21/2010  . NAUSEA 02/21/2010  . ABDOMINAL PAIN -GENERALIZED 02/21/2010  . PERSONAL HX COLONIC POLYPS 02/21/2010  . ANEMIA 04/16/2007  . Essential hypertension 04/16/2007  . DIVERTICULOSIS, COLON 04/16/2007  . ARTHRITIS 04/16/2007  . INTERNAL HEMORRHOIDS WITHOUT MENTION COMP 04/12/2007  . Asthma 04/12/2007  . Cardiac resynchronization therapy pacemaker (CRT-P) in place 04/12/2007  . Gastritis without bleeding 12/14/2006  . COLONIC POLYPS, HYPERPLASTIC 09/27/2002  . FATTY LIVER DISEASE 02/16/2002    Past Surgical History:  Procedure Laterality Date  . ABDOMINAL ULTRASOUND  12/01/2011   Peripelvic cysts- #1- 2.4x1.9x2.3cm, #2-1.2x0.9x1.2cm  . ANKLE FRACTURE SURGERY Right    "had rod put in"  . ANTERIOR CERVICAL DECOMP/DISCECTOMY FUSION    . APPENDECTOMY    . BACK SURGERY    . BIOPSY  12/29/2017   Procedure: BIOPSY;  Surgeon: [name], MD;  Location: [facility] END[facility];  Service: Endoscopy;;  . BIV ICD INSERTION CRT-D  2001?  [name] BIV PACEMAKER GENERATOR CHANGE OUT N/A 11/02/2012   Procedure: BIV PACEMAKER GENERATOR CHANGE OUT;  Surgeon: Sanda Klein, MD;  Location: [facility] CATH LAB;  Service: Cardiovascular;  Laterality: N/A;  . CARDIAC CATHETERIZATION  05/17/1999   No significant coronary obstructive disease w/ mild 20% luminal irregularity of the first diag branch of the LAD  . CARDIAC CATHETERIZATION  07/08/2002   No significant CAD, moderately depressed LV systolic function  . CARDIAC CATHETERIZATION Bilateral 04/26/2007   Normal findings, recommend medical therapy  . CARDIAC CATHETERIZATION  02/18/2008   Moderate CAD, would benefit from having a functional study, recommend continue medical therapy  . CARDIAC CATHETERIZATION  07/23/2012   Medical therapy  . CARDIAC CATHETERIZATION N/A 11/24/2014   Procedure: Left Heart Cath and Coronary Angiography;  Surgeon: Troy Sine, MD;  Location: [facility] CV LAB;  Service: Cardiovascular;  Laterality: N/A;  .  CARDIAC CATHETERIZATION  11/27/2014   Procedure: Intravascular Pressure Wire/FFR Study;  Surgeon: Peter M Martinique, MD;  Location: [facility] CV LAB;  Service: Cardiovascular;;  . CARDIAC CATHETERIZATION  10/09/2016  . COLONOSCOPY WITH PROPOFOL N/A 12/29/2017   Procedure: COLONOSCOPY WITH PROPOFOL;  Surgeon: [name], MD;  [facility];  Service: Endoscopy;  Laterality: N/A;  . CORONARY ANGIOGRAM  2010  . CORONARY ARTERY BYPASS GRAFT N/A 11/29/2014   Procedure: CORONARY ARTERY BYPASS GRAFTING x 5 (LIMA-LAD, SVG-D, SVG-OM1-OM2, SVG-PD);  Surgeon: Melrose Nakayama, MD;  Location: [facility];  Service: Open Heart Surgery;  Laterality: N/A;  . CORONARY STENT INTERVENTION N/A 10/31/2016   Procedure: CORONARY STENT INTERVENTION;  Surgeon: Burnell Blanks, MD;  Location: [facility] CV LAB;  Service: Cardiovascular;  Laterality: N/A;  . ESOPHAGOGASTRODUODENOSCOPY (EGD) WITH PROPOFOL N/A 12/29/2017   Procedure: ESOPHAGOGASTRODUODENOSCOPY (EGD) WITH PROPOFOL;  Surgeon: [name], MD;  [facility];  Service: Endoscopy;  Laterality: N/A;  . FRACTURE SURGERY    . INSERT / REPLACE / REMOVE PACEMAKER  1999  . KNEE ARTHROSCOPY Bilateral   . LEFT HEART CATH AND CORS/GRAFTS ANGIOGRAPHY N/A 12/09/2017   Procedure: LEFT HEART CATH AND CORS/GRAFTS ANGIOGRAPHY;  Surgeon: Troy Sine, MD;  Location: [facility] CV LAB;  Service: Cardiovascular;  Laterality: N/A;  . LEFT HEART CATHETERIZATION WITH CORONARY ANGIOGRAM N/A 07/23/2012   Procedure: LEFT HEART CATHETERIZATION WITH CORONARY ANGIOGRAM;  Surgeon: Leonie Man, MD;  Location: [facility] CATH LAB;  Service: Cardiovascular;  Laterality: N/A;  Carlton Adam MYOVIEW  11/14/2011   Mild-moderate defect seen in Mid Inferolateral and Mid Anterolateral regions-consistant w/ infarct/scar. No significant ischemia demonstrated.  [name] POLYPECTOMY  12/29/2017   Procedure: POLYPECTOMY;  Surgeon: [name], MD;  [facility]  ENDOS[facility];  Service: Endoscopy;;  . RIGHT/LEFT HEART CATH AND CORONARY ANGIOGRAPHY N/A 10/09/2016   Procedure: RIGHT/LEFT HEART CATH AND CORONARY ANGIOGRAPHY;  Surgeon: Jolaine Artist, MD;  Location: [facility] CV LAB;  Service: Cardiovascular;  Laterality: N/A;  . TEE WITHOUT CARDIOVERSION N/A 11/29/2014   Procedure: TRANSESOPHAGEAL ECHOCARDIOGRAM (TEE);  Surgeon: Melrose Nakayama, MD;  Location: [facility];  Service: Open Heart Surgery;  Laterality: N/A;  . TRANSTHORACIC ECHOCARDIOGRAM  07/23/2012   EF 55-60%, normal-mild  . TUBAL LIGATION       OB History   No obstetric history on file.      Home Medications    Prior to Admission medications   Medication Sig Start Date End Date Taking? Authorizing Provider  acetaminophen (TYLENOL) 500 MG tablet Take 1,000 mg by mouth every 6 (six) hours as needed for mild pain.    Yes [provider]  albuterol (PROVENTIL HFA;VENTOLIN HFA) 108 (90 Base) MCG/ACT inhaler Inhale 2 puffs into the lungs every 6 (six) hours as needed for wheezing or shortness of breath.   Yes [provider]  albuterol (PROVENTIL) (2.5 MG/3ML) 0.083% nebulizer solution Take 2.5 mg by nebulization every 6 (six) hours as needed for wheezing or shortness of breath.   Yes [provider]  AMBULATORY NON FORMULARY MEDICATION GI Cocktail - 90 ml of 2% lidocaine                      90 ml Dicyclomine 10 mg/5 ml                     270 ml Maalox   - take 5-10 ml as needed for severe pain 01/22/18  Yes Levin Erp, [place]  aspirin EC 81 MG tablet Take 1 tablet (81 mg total) by mouth daily. 03/27/16  Yes Kilroy, Luke K, PA-C  bisoprolol (ZEBETA) 5 MG tablet TAKE 1 TABLET EVERY DAY Patient taking differently: Take 5 mg by mouth at bedtime.  11/10/17  Yes Kilroy, Luke K, PA-C  budesonide-formoterol (SYMBICORT) 160-4.5 MCG/ACT inhaler Inhale 2 puffs into the lungs 2 (two) times daily.   Yes [provider]  cetirizine (ZYRTEC) 10 MG tablet  Take 10 mg by mouth at bedtime.  03/24/17  Yes [provider]  clopidogrel (PLAVIX) 75 MG tablet TAKE 1 TABLET EVERY DAY Patient taking differently: Take 75 mg by mouth every evening.  09/08/17  Yes Croitoru, [name], MD  clotrimazole (LOTRIMIN) 1 % cream Apply 1 application topically 2 (two) times daily. Patient taking differently: Apply 1 application topically 2 (two) times daily as needed (rash).  07/20/17  Yes Lendon Colonel, NP  clotrimazole (MYCELEX) 10 MG troche Take 10 mg by mouth as needed (Sore Mouth).   Yes [provider]  colchicine 0.6 MG tablet Take 0.6 mg by mouth as needed (gout).   Yes [provider]  diphenhydrAMINE (BENADRYL) 25 MG tablet Take 25 mg by mouth every 6 (six) hours as needed for allergies.   Yes [provider]  docusate sodium (COLACE) 100 MG capsule Take 200 mg by mouth at bedtime.    Yes [provider]  EPINEPHrine (EPIPEN 2-PAK) 0.3 mg/0.3 mL IJ SOAJ injection Inject 0.3 mg into the muscle once as needed (severe allergic reaction).   Yes [provider]  ezetimibe (ZETIA) 10 MG tablet Take 1 tablet (10 mg total) by mouth daily. 07/20/17 12/25/18 Yes Lendon Colonel, NP  famotidine (PEPCID) 40 MG tablet Take 1 tablet (40 mg total) by mouth at bedtime. 01/22/18  Yes Levin Erp, PA  febuxostat (ULORIC) 40 MG tablet Take 40 mg by mouth daily. 11/10/17  Yes [provider]  fluticasone (FLONASE) 50 MCG/ACT nasal spray Place 1 spray into both nostrils at bedtime as needed for rhinitis or allergies.    Yes [provider]  guaiFENesin (MUCINEX) 600 MG 12 hr tablet Take 600 mg by mouth at bedtime.    Yes [provider]  hydrocortisone 2.5 % cream Apply 2.5 application topically as needed. 01/08/18  Yes [provider]  hydroxypropyl methylcellulose / hypromellose (ISOPTO TEARS / GONIOVISC) 2.5 % ophthalmic solution Place 1 drop into both eyes 3 (three) times daily  as needed for dry eyes.    Yes [provider]  ipratropium (ATROVENT) 0.06 % nasal spray 1-2 pffs up to 4 x daily as needed Patient taking differently: Place 2 sprays into both nostrils 4 (four) times daily as needed for rhinitis.  10/16/16  Yes Tanda Rockers, MD  isosorbide mononitrate (IMDUR) 60 MG 24 hr tablet Take 1 tablet (60 mg total) by mouth daily. 06/08/17  Yes Croitoru, [name], MD  ketotifen (ZADITOR) 0.025 % ophthalmic solution Place 1 drop into both eyes 2 (two) times daily.   Yes [provider]  metolazone (ZAROXOLYN) 5 MG tablet Take 1 tablet (5 mg total) by mouth as needed. Take only as directed by [facility] Patient taking differently: Take 5 mg by mouth daily as needed (for weight gain of 3 LBS. or more).  07/21/16  Yes Eileen Stanford, PA-C  Multiple Vitamins-Minerals (ALIVE WOMENS ENERGY PO) Take 1 tablet by mouth daily.   Yes [provider]  nitroGLYCERIN (NITROSTAT) 0.4 MG SL tablet Place 1 tablet (0.4 mg total) under the tongue every 5 (five) minutes as needed for chest pain. 11/13/16 12/25/18 Yes Kilroy, Luke K, PA-C  ondansetron (ZOFRAN ODT) 4 MG disintegrating tablet Take 1 tablet (4 mg total) by mouth every 8 (eight) hours as needed. 04/18/17  Yes Isla Pence, MD  OXYGEN Inhale 2 L into the lungs See admin instructions. as needed for low oxygen levels & at bedtime each night.   Yes [provider]  pantoprazole (PROTONIX) 40 MG tablet TAKE 1 TABLET EVERY DAY Patient taking differently: Take 40 mg by mouth 2 (two) times daily.  11/25/17  Yes Croitoru, [name], MD  potassium chloride SA (K-DUR,KLOR-CON) 20 MEQ tablet TAKE 1 TABLET EVERY DAY Patient taking differently: Take 20 mEq by mouth daily.  09/08/17  Yes Croitoru, [name], MD  RESTASIS 0.05 % ophthalmic emulsion Place 1 drop into both eyes 2 (two) times daily.  11/30/17  Yes [provider]  rosuvastatin (CRESTOR) 20 MG tablet Take 1 tablet (20 mg  total) by mouth at  bedtime. 12/22/17  Yes Croitoru, [name], MD  saxagliptin HCl (ONGLYZA) 2.5 MG TABS tablet Take 2.5 mg by mouth daily.  12/08/17  Yes Almyra Deforest, PA  spironolactone (ALDACTONE) 25 MG tablet TAKE 1 TABLET EVERY DAY Patient taking differently: Take 25 mg by mouth at bedtime.  11/26/16  Yes Croitoru, [name], MD  torsemide (DEMADEX) 20 MG tablet TAKE 40 MG (2TAB) EVERY OTHER DAY ALTERNATING WITH 60 MG EVERY OTHER DAY Patient taking differently: Take 20-40 mg by mouth See admin instructions. TAKE 40 MG in the morning 20 mg in the evening 08/18/17  Yes Bensimhon, Shaune Pascal, MD  triamcinolone cream (KENALOG) 0.1 % Apply 1 application topically 2 (two) times daily as needed (itching).  08/24/14  Yes [provider]  VENTOLIN HFA 108 (90 Base) MCG/ACT inhaler INHALE 2 PUFFS EVERY 4 HOURS AS NEEDED FOR WHEEZING Patient taking differently: Inhale 2 puffs into the lungs every 4 (four) hours as needed for wheezing or shortness of breath.  07/10/16  Yes Rigoberto Noel, MD  ipratropium (ATROVENT) 0.02 % nebulizer solution Take 2.5 mLs (0.5 mg total) by nebulization 4 (four) times daily. Patient not taking: Reported on 02/14/2018 09/23/17   Kinnie Feil, PA-C    Family History Family History  Problem Relation Age of Onset  . Breast cancer Mother   . Diabetes Mother   . Heart disease Maternal Grandmother   . Kidney disease Maternal Grandmother   . Diabetes Maternal Grandmother   . Glaucoma Maternal Aunt   . Heart disease Maternal Aunt   . Asthma Sister   . Colon cancer Neg Hx   . Esophageal cancer Neg Hx     Social History Social History   Tobacco Use  . Smoking status: Former Smoker    Packs/day: 0.25    Years: 3.00    Pack years: 0.75    Types: Cigarettes    Last attempt to quit: 02/11/1968    Years since quitting: 50.0  . Smokeless tobacco: Never Used  Substance Use Topics  . Alcohol use: No  . Drug use: No     Allergies   Ivp dye [iodinated diagnostic agents]; Shellfish allergy;  Shellfish-derived products; Sulfa antibiotics; Iodine; Atorvastatin; Doxycycline; Cephalexin; and Zithromax [azithromycin]   Review of Systems Review of Systems  All other systems reviewed and are negative.    Physical Exam Updated Vital Signs BP (!) 96/59   Pulse 65   Temp 97.8 F (36.6 C) (Oral)   Resp 20   SpO2 95%   Physical Exam Vitals signs and nursing note reviewed.  Constitutional:      General: She is not in acute distress.    Appearance: She is well-developed. She is obese.  HENT:     Head: Normocephalic and atraumatic.     Right Ear: External ear normal.     Left Ear: External ear normal.  Eyes:     General: No scleral icterus.       Right eye: No discharge.        Left eye: No discharge.     Conjunctiva/sclera: Conjunctivae normal.  Neck:     Musculoskeletal: Neck supple.     Trachea: No tracheal deviation.  Cardiovascular:     Rate and Rhythm: Normal rate and regular rhythm.  Pulmonary:     Effort: Pulmonary effort is normal. No respiratory distress.     Breath sounds: Normal breath sounds. No stridor. No wheezing or rales.  Abdominal:  General: Abdomen is protuberant. Bowel sounds are normal. There is no distension.     Palpations: Abdomen is soft.     Tenderness: There is abdominal tenderness in the epigastric area. There is guarding. There is no rebound.     Hernia: No hernia is present.  Musculoskeletal:        General: No tenderness.     Right lower leg: Edema present.     Left lower leg: Edema present.  Skin:    General: Skin is warm and dry.     Findings: No rash.  Neurological:     Mental Status: She is alert.     Cranial Nerves: No cranial nerve deficit (no facial droop, extraocular movements intact, no slurred speech).     Sensory: No sensory deficit.     Motor: No abnormal muscle tone or seizure activity.     Coordination: Coordination normal.      ED Treatments / Results  Labs (all labs ordered are listed, but only abnormal  results are displayed) Labs Reviewed  COMPREHENSIVE METABOLIC PANEL - Abnormal; Notable for the following components:      Result Value   CO2 33 (*)    Glucose, Bld 172 (*)    Creatinine, Ser 1.42 (*)    Albumin 3.0 (*)    GFR calc non Af Amer 37 (*)    GFR calc Af Amer 42 (*)    All other components within normal limits  URINALYSIS, ROUTINE W REFLEX MICROSCOPIC - Abnormal; Notable for the following components:   APPearance HAZY (*)    Leukocytes, UA TRACE (*)    Bacteria, UA RARE (*)    Non Squamous Epithelial 0-5 (*)    All other components within normal limits  LIPASE, BLOOD  CBC    EKG None  Radiology No results found.  Procedures Procedures (including critical care time)  Medications Ordered in ED Medications  morphine 4 MG/ML injection 4 mg (has no administration in time range)  ondansetron (ZOFRAN) injection 4 mg (has no administration in time range)  morphine 4 MG/ML injection 4 mg (4 mg Intramuscular Given 02/14/18 1647)  ondansetron (ZOFRAN-ODT) disintegrating tablet 4 mg (4 mg Oral Given 02/14/18 1646)     Initial Impression / Assessment and Plan / ED Course  I have reviewed the triage vital signs and the nursing notes.  Pertinent labs & imaging results that were available during my care of the patient were reviewed by me and considered in my medical decision making (see chart for details).  Clinical Course as of Feb 14 1806  Nancy Fetter Feb 14, 2018  1730 Previous  imaging tests were reviewed including her CT scan in October and her more recent ultrasound   [JK]  1805 Lab tests reviewed.  No acute abnormality.  Patient was given a dose of pain medications  Symptoms improved.  She is feeling better now.  No nausea and vomiting.   [JK]    Clinical Course User Index [JK] Dorie Rank, MD   Patient presented with recurrent abdominal pain.  She has a known history of gallstones as well as gastritis and esophagitis.  The patient planning on elective surgery for her  gallbladder.  No signs of acute cholecystitis this evening.  She does not have a leukocytosis.  Her LFTs are normal.  She does not have any abdominal tenderness in her right upper quadrant and no peritoneal signs.  Patient symptoms improved with treatment.  I think she appears stable for outpatient follow-up.  Warning signs and precautions discussed. Final Clinical Impressions(s) / ED Diagnoses   Final diagnoses:  Epigastric pain    ED Discharge Orders    None       Dorie Rank, MD 02/14/18 [id number]

## 2018-02-14 NOTE — Discharge Instructions (Addendum)
Continue your current medications, follow-up with your GI doctor and surgeon as planned.  Return to the emergency room for fever vomiting worsening symptoms

## 2018-02-14 NOTE — ED Triage Notes (Addendum)
Middle  Upper abd pain that radiates to left and to back started this am states she had GB issues states saw her surgeon on Friday and theyare going to schedule surgery but has to see her cards dr first

## 2018-02-16 ENCOUNTER — Ambulatory Visit (INDEPENDENT_AMBULATORY_CARE_PROVIDER_SITE_OTHER): Payer: Medicare HMO

## 2018-02-16 DIAGNOSIS — I428 Other cardiomyopathies: Secondary | ICD-10-CM

## 2018-02-16 DIAGNOSIS — I5022 Chronic systolic (congestive) heart failure: Secondary | ICD-10-CM

## 2018-02-17 NOTE — Telephone Encounter (Signed)
Dr. [name], Pt underwent cath to evaluate prior to colonoscopy. See cath report. With no intervention but diffuse disease- treat medically, is she OK for colonoscopy and hold plavix for 5 days.   Please route response back to P CV DIV PREOP  Thanks

## 2018-02-17 NOTE — Progress Notes (Signed)
Remote pacemaker transmission.   

## 2018-02-18 LAB — CUP PACEART REMOTE DEVICE CHECK
Date Time Interrogation Session: 20200109064209
Implantable Lead Implant Date: 20080923
Implantable Lead Implant Date: 20080923
Implantable Lead Implant Date: 20080923
Implantable Lead Location: 753858
Implantable Lead Location: 753859
Implantable Lead Location: 753860
Implantable Lead Model: 4285
Implantable Lead Model: 4555
Implantable Lead Model: 7120
Implantable Lead Serial Number: 170220
Implantable Lead Serial Number: 486468
Implantable Pulse Generator Implant Date: 20140923
Pulse Gen Serial Number: 100731

## 2018-02-18 NOTE — Progress Notes (Signed)
Reviewed and agree with documentation and assessment and plan. K. Veena Aishani Kalis , MD   

## 2018-02-18 NOTE — Telephone Encounter (Signed)
[name], she already had colonoscopy performed on December 20th.  If she needs is to have another, obviously no further intervention is needed from [place].  Okay to hold Plavix for 5 days. MCr

## 2018-02-18 NOTE — Telephone Encounter (Signed)
I think calling her is fine.  I am more concerned about her breathing than her coronary situation.  We have all the data we need from the heart cath, please inquire about symptoms. MCr

## 2018-02-18 NOTE — Telephone Encounter (Signed)
Sorry Dr. Loletha Grayer. She did have colonoscopy. Now she is for lap [place]. I see that she was a no show for her hospital follow up after her cath. Do you think I can clear her with phone call for symptoms or bring her in to see an APP for clearance.   Please route response back to P CV DIV PREOP

## 2018-02-18 NOTE — Telephone Encounter (Signed)
she already had colonoscopy performed on December 20th.  If she needs is to have another, no further intervention is needed from [place].  Okay to hold Plavix for 5 days. MCr

## 2018-02-19 NOTE — Telephone Encounter (Signed)
Spoke with pt and she has been rescheduled to see Almyra Deforest, PA-C, 02/24/18. Pt thanked me for the call.

## 2018-02-19 NOTE — Telephone Encounter (Signed)
   Primary Cardiologist: Sanda Klein, MD  Chart reviewed as part of pre-operative protocol coverage. Patient was contacted 02/19/2018 in reference to pre-operative risk assessment for pending surgery as outlined below.  [name] was last seen on 12/03/17 by Almyra Deforest PA-C. She has h/o CAD s/p CABG 11/2014 (LIMA to LAD, SVG to D1, SVG to OM1 and OM2, SVG to PDA), NICM, CRT-P, CKD III, chronic respiratory insufficiency on 2 L home O2, h/o diastolic heart failure, morbid obesity, adrenal insufficiency, type 2 diabetes mellitus, hypertension, stage IIICKD, asthmaandobstructive sleep apnea. She saw Isaac Laud in follow-up that day to discuss pre-operative clearance for colonoscopy/gallbladder surgery. She did report progressive angina and cath showed severe multivessel disease but no acute lesions requiring intervention; medical therapy recommended, EF 50%. She did not return for cardiac follow-up as suggested. I spoke with patient who affirms from a cardiac standpoint she's actually felt better since last OV. She is rather sedentary so difficult to assess METS but no new CP and breathing is at baseline. Dr. [name] had reviewed chart and indicated that we had all the data that we needed from the heart cath. Given no acute change in clinical status recently from CV standpoint, based on ACC/AHA guidelines, the patient would be at acceptable risk for the planned procedure without further cardiovascular testing. Per Dr. [name], okay to hold Plavix for 5 days prior to procedure.  This patient does have significant COPD with chronic respiratory failure so we would also ask that surgical team consider pre-op clearance from pulmonology as well.  I will route this message to callback staff to help facilitate rescheduling of prior missed OVs but does not necessarily need to be before surgery.  I will route this recommendation to the requesting party via Epic fax function and remove from pre-op pool.  Please  call with questions.  Charlie Pitter, PA-C 02/19/2018, 8:56 AM

## 2018-02-24 ENCOUNTER — Ambulatory Visit (INDEPENDENT_AMBULATORY_CARE_PROVIDER_SITE_OTHER): Payer: Medicare HMO | Admitting: Physician Assistant

## 2018-02-24 ENCOUNTER — Other Ambulatory Visit: Payer: Self-pay | Admitting: Cardiovascular Disease

## 2018-02-24 ENCOUNTER — Encounter: Payer: Self-pay | Admitting: Physician Assistant

## 2018-02-24 VITALS — BP 130/70 | HR 65 | Ht 66.0 in | Wt 251.0 lb

## 2018-02-24 DIAGNOSIS — J9612 Chronic respiratory failure with hypercapnia: Secondary | ICD-10-CM

## 2018-02-24 DIAGNOSIS — I10 Essential (primary) hypertension: Secondary | ICD-10-CM

## 2018-02-24 DIAGNOSIS — N183 Chronic kidney disease, stage 3 unspecified: Secondary | ICD-10-CM

## 2018-02-24 DIAGNOSIS — I25810 Atherosclerosis of coronary artery bypass graft(s) without angina pectoris: Secondary | ICD-10-CM

## 2018-02-24 DIAGNOSIS — E119 Type 2 diabetes mellitus without complications: Secondary | ICD-10-CM | POA: Diagnosis not present

## 2018-02-24 DIAGNOSIS — Z01810 Encounter for preprocedural cardiovascular examination: Secondary | ICD-10-CM | POA: Diagnosis not present

## 2018-02-24 DIAGNOSIS — I5032 Chronic diastolic (congestive) heart failure: Secondary | ICD-10-CM

## 2018-02-24 DIAGNOSIS — Z9981 Dependence on supplemental oxygen: Secondary | ICD-10-CM

## 2018-02-24 DIAGNOSIS — J9611 Chronic respiratory failure with hypoxia: Secondary | ICD-10-CM | POA: Diagnosis not present

## 2018-02-24 DIAGNOSIS — G4733 Obstructive sleep apnea (adult) (pediatric): Secondary | ICD-10-CM

## 2018-02-24 NOTE — Patient Instructions (Signed)
Medication Instructions:  Your physician recommends that you continue on your current medications as directed. Please refer to the Current Medication list given to you today.  If you need a refill on your cardiac medications before your next appointment, please call your pharmacy.   Lab work: None If you have labs (blood work) drawn today and your tests are completely normal, you will receive your results only by: [name] MyChart Message (if you have MyChart) OR . A paper copy in the mail If you have any lab test that is abnormal or we need to change your treatment, we will call you to review the results.  Testing/Procedures: None  Follow-Up: Your physician recommends that you schedule a follow-up appointment in: 5/6 months with Dr. [name].

## 2018-02-24 NOTE — Progress Notes (Signed)
Cardiology Office Note    Date:  02/26/2018   ID:  [name], DOB 1945/03/13, MRN [id number]  PCP:  Darcus Austin, MD (Inactive)  Cardiologist:  Dr. [name]   Chief Complaint  Patient presents with  . Pre-op Exam    upcoming gallbladder surgery by Dr. Redmond Pulling    History of Present Illness:  [name] is a 73 y.o. female with PMH of CAD s/p CABG 11/2014 (LIMA to LAD, SVG to D1, SVG to OM1 and OM2, SVG to PDA), chronic respiratory insufficiency on 2 L home O2, h/o diastolic heart failure, morbid obesity, adrenal insufficiency, type 2 diabetes mellitus, hypertension, stage IIICKD, asthmaandobstructive sleep apnea. She had a history of nonischemic cardiomyopathy and heart failure preceding the diagnosis of CAD. She also had complete heart block and pacemaker placed in 2001, this was upgraded to biventricular ICD implanted in 2008 for severe cardiac myopathy with EF 20%,downgraded to CRT-P in 2014([employer])following excellent response from resynchronization with normalization of LV function. The current atrial and right ventricular leads were implanted in 2001, LV lead was implanted in 2008. The defibrillator leads implanted in 2008 was capped and its tip resected at the time of bypass surgery.  Last echocardiogram obtained on 10/24/2015 showed EF 55 to 60%, grade 1 DD.  She has very high atrial pacing threshold.  Device has been programmed to DDD.  Patient presented to the hospital in September 2018 with unstable angina.  Cardiac catheterization obtained on 10/09/2016 showed severe three-vessel CAD, patent LIMA to LAD, patent SVG to diagonal, patent SVG to RCA, SVG to left circumflex and OM were occluded ostially.  EF was 60%.  Right heart cath at the time showed normal wedge pressure and cardiac output.  Patient eventually and had DES x3 in the AV groove left circumflex and DES x1 in the intermediate branch on 10/31/2016.  She was placed on aspirin and Plavix.  Carvedilol has  been switched to bisoprolol in Apr 2019.  Patient was seen in June 2019 with multiple somatic complaints.  She also reports she has been taking extra doses of the torsemide which has been helping with her symptom.  I saw the patient for preoperative clearance on 12/03/2017, however patient at the time complained of continued intermittent chest pain similar to her previous anginal symptoms.  I discussed the case with Dr. [name] and recommended cardiac catheterization for definitive evaluation prior to clearance.  She ended up having cardiac catheterization on 12/09/2017 which showed 100% proximal RCA, 60% ostial diagonal, 50% ostial D2, 100% mid LAD, patent ramus and left circumflex stent, patent LIMA to LAD, occluded SVG to ramus and the left circumflex vessel, EF 50 to 55%.  Medical therapy was recommended.  She was cleared to proceed with her surgery.  Patient presents today for follow-up, she has been feeling well and denies any further chest pain.  She still has upcoming gallbladder surgery, she is cleared from cardiology perspective to proceed with the surgery given the reassuring cardiac catheterization recently.  I am more worried about her pulmonary perspective to proceed with such surgery, he is currently dependent on oxygen, recommend she discuss with her pulmonologist before proceeding with surgery.   Past Medical History:  Diagnosis Date  . AICD (automatic cardioverter/defibrillator) present   . Anemia   . Arthritis   . Asthma   . CAD (coronary artery disease)    a. 10/09/16 LHC: SVG->LAD patent, SVG->Diag patent, SVG->RCA patent, SVG->LCx occluded. EF 60%, b. 10/31/16 LHC DES to  AV groove Circ, DES to intermed branch  . Cellulitis and abscess of foot 12/2014   RT FOOT  . CHF (congestive heart failure) ([place])   . Chronic bronchitis ([place])   . Chronic lower back pain   . Diverticulosis   . Facial numbness 02/2016  . Fatty liver disease, nonalcoholic   . Gastritis   . GERD  (gastroesophageal reflux disease)   . Gout   . H/O hiatal hernia   . High cholesterol   . Hyperplastic colon polyp   . Hypertension   . IBS (irritable bowel syndrome)   . Internal hemorrhoids   . Ischemic cardiomyopathy   . Obesity   . On home oxygen therapy    "2L at night" (10/31/2016)  . OSA (obstructive sleep apnea)    "can't tolerate a mask" (10/30/2016)  . Pneumonia    "couple times" (10/31/2016)  . Shortness of breath   . TIA (transient ischemic attack)    "recently" (10/31/2016)  . Type II diabetes mellitus ([place])     Past Surgical History:  Procedure Laterality Date  . ABDOMINAL ULTRASOUND  12/01/2011   Peripelvic cysts- #1- 2.4x1.9x2.3cm, #2-1.2x0.9x1.2cm  . ANKLE FRACTURE SURGERY Right    "had rod put in"  . ANTERIOR CERVICAL DECOMP/DISCECTOMY FUSION    . APPENDECTOMY    . BACK SURGERY    . BIOPSY  12/29/2017   Procedure: BIOPSY;  Surgeon: [name], MD;  Location: [facility];  Service: Endoscopy;;  . BIV ICD INSERTION CRT-D  2001?  [name] BIV PACEMAKER GENERATOR CHANGE OUT N/A 11/02/2012   Procedure: BIV PACEMAKER GENERATOR CHANGE OUT;  Surgeon: Sanda Klein, MD;  Location: [facility];  Service: Cardiovascular;  Laterality: N/A;  . CARDIAC CATHETERIZATION  05/17/1999   No significant coronary obstructive disease w/ mild 20% luminal irregularity of the first diag branch of the LAD  . CARDIAC CATHETERIZATION  07/08/2002   No significant CAD, moderately depressed LV systolic function  . CARDIAC CATHETERIZATION Bilateral 04/26/2007   Normal findings, recommend medical therapy  . CARDIAC CATHETERIZATION  02/18/2008   Moderate CAD, would benefit from having a functional study, recommend continue medical therapy  . CARDIAC CATHETERIZATION  07/23/2012   Medical therapy  . CARDIAC CATHETERIZATION N/A 11/24/2014   Procedure: Left Heart Cath and Coronary Angiography;  Surgeon: Troy Sine, MD;  Location: [facility] [facility];  Service: Cardiovascular;  Laterality: N/A;    . CARDIAC CATHETERIZATION  11/27/2014   Procedure: Intravascular Pressure Wire/FFR Study;  Surgeon: Peter M Martinique, MD;  Location: [facility];  Service: Cardiovascular;;  . CARDIAC CATHETERIZATION  10/09/2016  . COLONOSCOPY WITH PROPOFOL N/A 12/29/2017   Procedure: COLONOSCOPY WITH PROPOFOL;  Surgeon: [name], MD;  Location: [facility];  Service: Endoscopy;  Laterality: N/A;  . CORONARY ANGIOGRAM  2010  . CORONARY ARTERY BYPASS GRAFT N/A 11/29/2014   Procedure: CORONARY ARTERY BYPASS GRAFTING x 5 (LIMA-LAD, SVG-D, SVG-OM1-OM2, SVG-PD);  Surgeon: Melrose Nakayama, MD;  Location: [facility];  Service: Open Heart Surgery;  Laterality: N/A;  . CORONARY STENT INTERVENTION N/A 10/31/2016   Procedure: CORONARY STENT INTERVENTION;  Surgeon: Burnell Blanks, MD;  Location: [place] [facility];  Service: Cardiovascular;  Laterality: N/A;  . ESOPHAGOGASTRODUODENOSCOPY (EGD) WITH PROPOFOL N/A 12/29/2017   Procedure: ESOPHAGOGASTRODUODENOSCOPY (EGD) WITH PROPOFOL;  Surgeon: [name], MD;  [facility];  Service: Endoscopy;  Laterality: N/A;  . FRACTURE SURGERY    . INSERT / REPLACE / [place]  .  KNEE ARTHROSCOPY Bilateral   . LEFT HEART CATH AND CORS/GRAFTS ANGIOGRAPHY N/A 12/09/2017   Procedure: LEFT HEART CATH AND CORS/GRAFTS ANGIOGRAPHY;  Surgeon: Troy Sine, MD;  Location: [facility];  Service: Cardiovascular;  Laterality: N/A;  . LEFT HEART CATHETERIZATION WITH CORONARY ANGIOGRAM N/A 07/23/2012   Procedure: LEFT HEART CATHETERIZATION WITH CORONARY ANGIOGRAM;  Surgeon: Leonie Man, MD;  Location: [facility];  Service: Cardiovascular;  Laterality: N/A;  Carlton Adam MYOVIEW  11/14/2011   Mild-moderate defect seen in Mid Inferolateral and Mid Anterolateral regions-consistant w/ infarct/scar. No significant ischemia demonstrated.  [name] POLYPECTOMY  12/29/2017   Procedure: POLYPECTOMY;  Surgeon: [name], MD;  Location: [facility];  Service: Endoscopy;;  . RIGHT/LEFT HEART CATH AND CORONARY ANGIOGRAPHY N/A 10/09/2016   Procedure: RIGHT/LEFT HEART CATH AND CORONARY ANGIOGRAPHY;  Surgeon: Jolaine Artist, MD;  Location: [facility] [facility];  Service: Cardiovascular;  Laterality: N/A;  . TEE WITHOUT CARDIOVERSION N/A 11/29/2014   Procedure: TRANSESOPHAGEAL ECHOCARDIOGRAM (TEE);  Surgeon: Melrose Nakayama, MD;  Location: [place];  Service: Open Heart Surgery;  Laterality: N/A;  . TRANSTHORACIC ECHOCARDIOGRAM  07/23/2012   EF 55-60%, normal-mild  . TUBAL LIGATION      Current Medications: Outpatient Medications Prior to Visit  Medication Sig Dispense Refill  . acetaminophen (TYLENOL) 500 MG tablet Take 1,000 mg by mouth every 6 (six) hours as needed for mild pain.     [name] albuterol (PROVENTIL HFA;VENTOLIN HFA) 108 (90 Base) MCG/ACT inhaler Inhale 2 puffs into the lungs every 6 (six) hours as needed for wheezing or shortness of breath.    [name] albuterol (PROVENTIL) (2.5 MG/3ML) 0.083% nebulizer solution Take 2.5 mg by nebulization every 6 (six) hours as needed for wheezing or shortness of breath.    . AMBULATORY NON FORMULARY MEDICATION GI Cocktail - 90 ml of 2% lidocaine                      90 ml Dicyclomine 10 mg/5 ml                     270 ml Maalox   - take 5-10 ml as needed for severe pain 450 mL 0  . aspirin EC 81 MG tablet Take 1 tablet (81 mg total) by mouth daily. 90 tablet 3  . bisoprolol (ZEBETA) 5 MG tablet TAKE 1 TABLET EVERY DAY (Patient taking differently: Take 5 mg by mouth at bedtime. ) 90 tablet 2  . budesonide-formoterol (SYMBICORT) 160-4.5 MCG/ACT inhaler Inhale 2 puffs into the lungs 2 (two) times daily.    . cetirizine (ZYRTEC) 10 MG tablet Take 10 mg by mouth at bedtime.     . clopidogrel (PLAVIX) 75 MG tablet TAKE 1 TABLET EVERY DAY (Patient taking differently: Take 75 mg by mouth every evening. ) 90 tablet 2  . clotrimazole (LOTRIMIN) 1 % cream Apply 1 application topically 2 (two) times  daily. (Patient taking differently: Apply 1 application topically 2 (two) times daily as needed (rash). ) 30 g 0  . clotrimazole (MYCELEX) 10 MG troche Take 10 mg by mouth as needed (Sore Mouth).    . colchicine 0.6 MG tablet Take 0.6 mg by mouth as needed (gout).    [name] diphenhydrAMINE (BENADRYL) 25 MG tablet Take 25 mg by mouth every 6 (six) hours as needed for allergies.    [name] docusate sodium (COLACE) 100 MG capsule Take 200 mg by mouth at bedtime.     [name]  EPINEPHrine (EPIPEN 2-PAK) 0.3 mg/0.3 mL IJ SOAJ injection Inject 0.3 mg into the muscle once as needed (severe allergic reaction).    . ezetimibe (ZETIA) 10 MG tablet Take 1 tablet (10 mg total) by mouth daily. 90 tablet 3  . famotidine (PEPCID) 40 MG tablet Take 1 tablet (40 mg total) by mouth at bedtime. 30 tablet 3  . febuxostat (ULORIC) 40 MG tablet Take 40 mg by mouth daily.  3  . fluticasone (FLONASE) 50 MCG/ACT nasal spray Place 1 spray into both nostrils at bedtime as needed for rhinitis or allergies.     [name] guaiFENesin (MUCINEX) 600 MG 12 hr tablet Take 600 mg by mouth at bedtime.     . hydrocortisone 2.5 % cream Apply 2.5 application topically as needed.  1  . hydroxypropyl methylcellulose / hypromellose (ISOPTO TEARS / GONIOVISC) 2.5 % ophthalmic solution Place 1 drop into both eyes 3 (three) times daily as needed for dry eyes.     [name] ipratropium (ATROVENT) 0.02 % nebulizer solution Take 2.5 mLs (0.5 mg total) by nebulization 4 (four) times daily. 75 mL 12  . ipratropium (ATROVENT) 0.06 % nasal spray 1-2 pffs up to 4 x daily as needed (Patient taking differently: Place 2 sprays into both nostrils 4 (four) times daily as needed for rhinitis. ) 15 mL 12  . isosorbide mononitrate (IMDUR) 60 MG 24 hr tablet Take 1 tablet (60 mg total) by mouth daily. 180 tablet 3  . ketotifen (ZADITOR) 0.025 % ophthalmic solution Place 1 drop into both eyes 2 (two) times daily.    . metolazone (ZAROXOLYN) 5 MG tablet Take 1 tablet (5 mg total) by mouth as  needed. Take only as directed by Heart Failure Clinic (Patient taking differently: Take 5 mg by mouth daily as needed (for weight gain of 3 LBS. or more). ) 5 tablet 0  . Multiple Vitamins-Minerals (ALIVE WOMENS ENERGY PO) Take 1 tablet by mouth daily.    . nitroGLYCERIN (NITROSTAT) 0.4 MG SL tablet Place 1 tablet (0.4 mg total) under the tongue every 5 (five) minutes as needed for chest pain. 25 tablet 3  . ondansetron (ZOFRAN ODT) 4 MG disintegrating tablet Take 1 tablet (4 mg total) by mouth every 8 (eight) hours as needed. 10 tablet 0  . OXYGEN Inhale 2 L into the lungs See admin instructions. as needed for low oxygen levels & at bedtime each night.    . pantoprazole (PROTONIX) 40 MG tablet TAKE 1 TABLET EVERY DAY (Patient taking differently: Take 40 mg by mouth 2 (two) times daily. ) 90 tablet 3  . potassium chloride SA (K-DUR,KLOR-CON) 20 MEQ tablet TAKE 1 TABLET EVERY DAY (Patient taking differently: Take 20 mEq by mouth daily. ) 90 tablet 2  . RESTASIS 0.05 % ophthalmic emulsion Place 1 drop into both eyes 2 (two) times daily.   11  . saxagliptin HCl (ONGLYZA) 2.5 MG TABS tablet Take 2.5 mg by mouth daily.     [name] spironolactone (ALDACTONE) 25 MG tablet TAKE 1 TABLET EVERY DAY (Patient taking differently: Take 25 mg by mouth at bedtime. ) 90 tablet 3  . torsemide (DEMADEX) 20 MG tablet TAKE 40 MG (2TAB) EVERY OTHER DAY ALTERNATING WITH 60 MG EVERY OTHER DAY (Patient taking differently: Take 20-40 mg by mouth See admin instructions. TAKE 40 MG in the morning 20 mg in the evening) 225 tablet 3  . triamcinolone cream (KENALOG) 0.1 % Apply 1 application topically 2 (two) times daily as needed (itching).     [name]  VENTOLIN HFA 108 (90 Base) MCG/ACT inhaler INHALE 2 PUFFS EVERY 4 HOURS AS NEEDED FOR WHEEZING (Patient taking differently: Inhale 2 puffs into the lungs every 4 (four) hours as needed for wheezing or shortness of breath. ) 18 g 3  . rosuvastatin (CRESTOR) 20 MG tablet Take 1 tablet (20 mg total)  by mouth at bedtime. 90 tablet 0   Facility-Administered Medications Prior to Visit  Medication Dose Route Frequency Provider Last Rate Last Dose  . diphenhydrAMINE (BENADRYL) injection 50 mg  50 mg Intravenous Once [place], Isaac Laud, [place]         Allergies:   Ivp dye [iodinated diagnostic agents]; Shellfish allergy; Shellfish-derived products; Sulfa antibiotics; Iodine; Atorvastatin; Doxycycline; Cephalexin; and Zithromax [azithromycin]   Social History   Socioeconomic History  . Marital status: Widowed    Spouse name: Not on file  . Number of children: 5  . Years of education: Not on file  . Highest education level: Not on file  Occupational History  . Occupation: STAFF/BUFFET    Employer: [employer]  Social Needs  . Financial resource strain: Very hard  . Food insecurity:    Worry: Often true    Inability: Often true  . Transportation needs:    Medical: Not on file    Non-medical: Not on file  Tobacco Use  . Smoking status: Former Smoker    Packs/day: 0.25    Years: 3.00    Pack years: 0.75    Types: Cigarettes    Last attempt to quit: 02/11/1968    Years since quitting: 50.0  . Smokeless tobacco: Never Used  Substance and Sexual Activity  . Alcohol use: No  . Drug use: No  . Sexual activity: Never  Lifestyle  . Physical activity:    Days per week: 0 days    Minutes per session: 0 min  . Stress: Very much  Relationships  . Social connections:    Talks on phone: Not on file    Gets together: Not on file    Attends religious service: Not on file    Active member of club or organization: Not on file    Attends meetings of clubs or organizations: Not on file    Relationship status: Not on file  Other Topics Concern  . Not on file  Social History Narrative   Widowed last year. She lives with her two sons.     Family History:  The patient's family history includes Asthma in her sister; Breast cancer in her mother; Diabetes in her maternal grandmother and  mother; Glaucoma in her maternal aunt; Heart disease in her maternal aunt and maternal grandmother; Kidney disease in her maternal grandmother.   ROS:   Please see the history of present illness.    ROS All other systems reviewed and are negative.   PHYSICAL EXAM:   VS:  BP 130/70   Pulse 65   Ht 5\' 6"  (1.676 m)   Wt 251 lb (113.9 kg)   SpO2 (!) 87%   BMI 40.51 kg/m    GEN: sitting in wheelchair, on home O2 HEENT: normal  Neck: no JVD, carotid bruits, or masses Cardiac: RRR; no murmurs, rubs, or gallops,no edema  Respiratory:  Diminished breath sound bilaterally GI: soft, nontender, nondistended, + BS MS: no deformity or atrophy  Skin: warm and dry, no rash Neuro:  Alert and Oriented x 3, Strength and sensation are intact Psych: euthymic mood, full affect  Wt Readings from Last 3 Encounters:  02/24/18  251 lb (113.9 kg)  01/22/18 256 lb 6 oz (116.3 kg)  12/29/17 233 lb (105.7 kg)      Studies/Labs Reviewed:   EKG:  EKG is not ordered today.    Recent Labs: 09/23/2017: B Natriuretic Peptide 172.5 02/14/2018: ALT 24; BUN 23; Creatinine, Ser 1.42; Hemoglobin 13.7; Platelets 177; Potassium 3.7; Sodium 140   Lipid Panel    Component Value Date/Time   CHOL 166 10/31/2016 0314   TRIG 181 (H) 10/31/2016 0314   HDL 39 (L) 10/31/2016 0314   CHOLHDL 4.3 10/31/2016 0314   VLDL 36 10/31/2016 0314   LDLCALC 91 10/31/2016 0314    Additional studies/ records that were reviewed today include:   Cath 12/09/2017  Prox RCA lesion is 100% stenosed.  Previously placed Ost Ramus to Ramus stent (unknown type) is widely patent.  Ost 1st Diag lesion is 60% stenosed.  Ost 2nd Diag lesion is 50% stenosed.  Mid LAD lesion is 100% stenosed.  Previously placed Ost Cx to Mid Cx stent (unknown type) is widely patent.  Origin to Prox Graft lesion before Ramus is 100% stenosed.  The left ventricular ejection fraction is 50-55% by visual estimate.  LV end diastolic pressure is mildly  elevated.  The left ventricular systolic function is normal.   Severe multivessel CAD with 60% narrowing proximally in the first diagonal branch of the LAD, 50% narrowing in the second branch with total occlusion of the of of the LAD after this proximal second diagonal vessel; widely patent ostial stent in the ramus intermediate vessel; widely patent stents in the left circumflex coronary artery; total proximal RCA occlusion.  Patent LIMA graft supplying the mid LAD.  Old occluded vein graft which sequentially had supplied the ramus intermediate and circumflex vessels.  Patent vein graft supplying the diagonal vessel.  Pain vein graft supplying the  PDA.  Low normal LV function with an EF of 50%; LVEDP 17 mmHg.  RECOMMENDATION: Medical therapy for optimal blood pressure control and aggressive lipid therapy LDL target 70.  Recommend dual antiplatelet therapy long-term (beyond 12 months) because of coronary stents and previously documented graft disease.    ASSESSMENT:    1. Preop cardiovascular exam   2. Coronary artery disease involving coronary bypass graft of native heart without angina pectoris   3. Chronic respiratory failure with hypoxia and hypercapnia (HCC)   4. Chronic diastolic heart failure ([place])   5. Morbid obesity ([place])   6. Controlled type 2 diabetes mellitus without complication, without long-term current use of insulin ([place])   7. Essential hypertension   8. CKD (chronic kidney disease), stage III ([place])   9. OSA (obstructive sleep apnea)   10. On home O2      PLAN:  In order of problems listed above:  1. Preoperative clearance: Patient has upcoming laparoscopic cholecystectomy by Dr. Redmond Pulling, she is cleared from cardiac perspective to proceed with surgery.  She will need to hold her Plavix for 5 days prior to the procedure and restart as soon as possible after the procedure.  It is ideal she stay on aspirin during the surgery, however if absolutely needed  to come off the aspirin, she may hold aspirin for 5 days as well.  Recent cardiac catheterization is reassuring.  I would recommend she discuss with her pulmonologist prior to surgery as she is at higher risk from her pulmonary perspective than cardiac perspective to go through with the surgery.  2. CAD s/p CABG: On aspirin and Plavix.  Recent cardiac  catheterization showed stable coronary anatomy  3. Chronic diastolic heart failure: Euvolemic on physical exam  4. Chronic respiratory failure on home O2: Although she is cleared from cardiology perspective, I would recommend she discuss with her pulmonologist prior to proceeding with surgery as I am more worried about her from pulmonary perspective   5. Hypertension: Blood pressure stable on current therapy  6. DM2: Managed by primary care provider  7. CKD stage III: Stable renal function on last lab work    Medication Adjustments/Labs and Tests Ordered: Current medicines are reviewed at length with the patient today.  Concerns regarding medicines are outlined above.  Medication changes, Labs and Tests ordered today are listed in the Patient Instructions below. Patient Instructions  Medication Instructions:  Your physician recommends that you continue on your current medications as directed. Please refer to the Current Medication list given to you today.  If you need a refill on your cardiac medications before your next appointment, please call your pharmacy.   Lab work: None If you have labs (blood work) drawn today and your tests are completely normal, you will receive your results only by: [name] MyChart Message (if you have MyChart) OR . A paper copy in the mail If you have any lab test that is abnormal or we need to change your treatment, we will call you to review the results.  Testing/Procedures: None  Follow-Up: Your physician recommends that you schedule a follow-up appointment in: 5/6 months with Dr. [name].        Hilbert Corrigan, [place]  02/26/2018 11:32 PM    [address], [place], [place]  [address] Phone: [phone]; Fax: [phone]

## 2018-02-24 NOTE — Telephone Encounter (Signed)
Rx request sent to pharmacy.  

## 2018-02-26 ENCOUNTER — Encounter: Payer: Self-pay | Admitting: Physician Assistant

## 2018-02-26 DIAGNOSIS — M25551 Pain in right hip: Secondary | ICD-10-CM | POA: Diagnosis not present

## 2018-02-26 DIAGNOSIS — M109 Gout, unspecified: Secondary | ICD-10-CM | POA: Diagnosis not present

## 2018-03-01 ENCOUNTER — Ambulatory Visit: Payer: Medicare HMO | Admitting: Internal Medicine

## 2018-03-01 DIAGNOSIS — R269 Unspecified abnormalities of gait and mobility: Secondary | ICD-10-CM | POA: Diagnosis not present

## 2018-03-01 DIAGNOSIS — I10 Essential (primary) hypertension: Secondary | ICD-10-CM | POA: Diagnosis not present

## 2018-03-01 DIAGNOSIS — I5032 Chronic diastolic (congestive) heart failure: Secondary | ICD-10-CM | POA: Diagnosis not present

## 2018-03-01 DIAGNOSIS — J9611 Chronic respiratory failure with hypoxia: Secondary | ICD-10-CM | POA: Diagnosis not present

## 2018-03-01 DIAGNOSIS — R0602 Shortness of breath: Secondary | ICD-10-CM | POA: Diagnosis not present

## 2018-03-01 DIAGNOSIS — J4530 Mild persistent asthma, uncomplicated: Secondary | ICD-10-CM | POA: Diagnosis not present

## 2018-03-01 DIAGNOSIS — J4520 Mild intermittent asthma, uncomplicated: Secondary | ICD-10-CM | POA: Diagnosis not present

## 2018-03-02 DIAGNOSIS — I5032 Chronic diastolic (congestive) heart failure: Secondary | ICD-10-CM | POA: Diagnosis not present

## 2018-03-02 NOTE — Progress Notes (Signed)
I agree there is no need for additional work up or any change in cardiac meds. I am also more concerned about her pulmonary status. Please make sure they understand not to interrupt her bisoprolol in the perioperative period.  MCr

## 2018-03-05 ENCOUNTER — Encounter: Payer: Self-pay | Admitting: Internal Medicine

## 2018-03-05 ENCOUNTER — Ambulatory Visit (INDEPENDENT_AMBULATORY_CARE_PROVIDER_SITE_OTHER): Payer: Medicare HMO | Admitting: Internal Medicine

## 2018-03-05 VITALS — BP 108/70 | HR 65 | Ht 66.0 in | Wt 249.0 lb

## 2018-03-05 DIAGNOSIS — J9611 Chronic respiratory failure with hypoxia: Secondary | ICD-10-CM | POA: Diagnosis not present

## 2018-03-05 DIAGNOSIS — J9612 Chronic respiratory failure with hypercapnia: Secondary | ICD-10-CM

## 2018-03-05 DIAGNOSIS — J4520 Mild intermittent asthma, uncomplicated: Secondary | ICD-10-CM | POA: Diagnosis not present

## 2018-03-05 DIAGNOSIS — R0609 Other forms of dyspnea: Secondary | ICD-10-CM

## 2018-03-05 DIAGNOSIS — R0600 Dyspnea, unspecified: Secondary | ICD-10-CM

## 2018-03-05 NOTE — Patient Instructions (Signed)
Unless you are having flare of cough/ wheezing you are cleared for surgery.   There will be a higher risk of post op complications due to your weight that can be offset by using minimal sedation  and pain meds and perhabs bipap until you are out of bed which should happen asap and our team can see you as needed.  Work on inhaler technique:  relax and gently blow all the way out then take a nice smooth deep breath back in, triggering the inhaler at same time you start breathing in.  Hold for up to 5 seconds if you can.   Rinse and gargle with water when done.  Pulmonary follow up is as needed

## 2018-03-05 NOTE — Progress Notes (Signed)
Subjective:   Patient ID: [name], female    DOB: Jun 11, 1945   MRN: [id number]    Brief patient profile:  6 yowf quit smoking in 1970s with h/o asthma as child but no trouble while smoking referred to pulmonary clinic 08/25/2012 for eval of sob bu [name] after admit:   Admit date: 07/22/2012  Discharge date: 07/24/2012  Primary Care Provider: Dr Arville Care  Primary Cardiologist: Dr Rollene Fare  Discharge Diagnoses  Unstable angina   HYPERTENSION- ;poor control  CAD, 17% LAD with systolic bridging- 51% 0/25/85  PTVDP 2001, upgraded to [facility] BiV ICD Sept 2008  Cardiomyopathy, nonischemic, Bi V responder, EF >55% by 2D Oct 2013  Allergic to IV contrast  Morbid obesity  COPD (chronic obstructive pulmonary disease), asthmatic component  GERD (gastroesophageal reflux disease)  Sleep apnea, C-pap intol  ASTHMA, UNSPECIFIED, UNSPECIFIED STATUS   08/25/2012 1st pulmonary eval cc onset of sob p ET for knee surgery, variable and improved with symbicort but variably better for up to a year under the care of Dr Orvil Feil = symbicort and zytrec and nasal spray and no spiriva and no nebs  And maybe  Better p  Albuterol" if walked too fast".  Assoc with overt HB, sense of "chest congestion" but no excess mucus production  >>stop spiriva and added pepcid and delsym   09/08/2012 Follow up and Med review  Pt returns for 2 week follow up and med review  Seen for IOV for 2nd opinion for Dyspnea /Asthma vs COPD last ov . She was recommended to stop spiriva and continue on symbicort Twice daily   Added Pepcid At bedtime  For possible GERD component.  She says she feels she is doing okay. No flare in cough or wheezing.  Does have OSA but intolerant to CPAP . Wakes up tired a lot and feel fatigued through day.  Rec No change rx, med calendar done      09/08/2013 f/u ov/Geniece Akers re: obesity/ gerd/ no med cal/ symbicort 160 2bid and xopenex  Chief Complaint  Patient presents with  . Acute Visit     Pt c/o increased SOB for the past month.  She is SOB with or w/o any exertion.  "Feels like something cutting my air off".  She also c/o wheezing and cough- prod with thick, white sputum.  She is using albuterol inhaler 3-4 times daily. Has neb at home but has not used yet.   onset was gradual, pattern is persistent daily > noct x worse  over a month, really extends back a year  feels choking sensation day > noct no purulent sputum, poor hfa (see a/p) To ER 08/18/13 with neg v/q  Much better p pred Taking ppi at hs Has saba neb but not using  rec Change omeprazole to Take 30- 60 min before your first and last meals of the day  Work on inhaler technique:    If not better > Prednisone 10 mg take  4 each am x 2 days,   2 each am x 2 days,  1 each am x 2 days and stop  GERD diet   10/10/2013 f/u ov/Early Steel re: MO/ ? Asthma on symbicort 160 2bid and prn proair Chief Complaint  Patient presents with  . Follow-up    PFT done today. Breathing may be slightly improved. Using albuterol on average oncce per day.   [employer] walking leans on cart due to sob Also fatigued / not able to tolerate cpap by Dr  Wolicki rec Try bisoprolol 5 mg one daily and stop the metaprolol - call me between now and your next visit with Dr C if your blood pressure isn't doing great. Try symbicort 80 Take 2 puffs first thing in am and then another 2 puffs about 12 hours later to see if notice any change in resp symptoms and if not this will be your new maintenance dose    08/29/2016  Acute ov/Manly Nestle re-establish re: sob ? Etiology  Chief Complaint  Patient presents with  . Acute Visit    Increased SOB x 2 months. She states that she gets out of breath just walking around her apartment. She also feels SOB at rest occ "feels like something cute my air off".  She has occ wheezing and occ chest tightness. She has a cough mainly only in the am- prod with thick, white sputum. She takes mucinex daily. She uses her albuterol inhaler 1 x  daily on average.   better p heart surgery but then several months prior to [place] more doe  Abrupt sob at rest then resolves after a few minutes/ sometime a eats - no def consistent response  to [place] ok L side down / 2 pillow orthopnea  Cards w/u neg  prilosec 30 min ac in am and more often takes the second dose  after supper  rec Prilosec should be Take 30- 60 min before your first and last meals of the day  GERD Plan A = Automatic = symbicort 80 2bid > symb 160 2bid  Work on inhaler technique:    Plan B = Backup Only use your albuterol as a rescue medication     10/16/2016  f/u ov/Kirtan Sada re: sob with pfts nl ex low erv c/w obesity  Chief Complaint  Patient presents with  . Follow-up    PFT's done today. She was started on nocturnal o2 per Dr. Haroldine Laws. She is coughing with thick, white sputum. Her breathing has improved slightly since her last visit here. She uses her albuterol inhaler 2 x per wk on average and rarely uses neb.    drippy nose eating x sev years no better with nasal steroids assoc with day > noct coughing no better on symb 80 vs 160 - says breathing worse on the 80 so resumed 160 / saba use as above very rarely noct, mostly when "overdoes it"  rec For drippy nose > atrovent nasal spray up to 2 pffs 4 x daily  - point toward nose on same side Work on inhaler technique:       03/05/2018  f/u ov/Taiylor Virden re: needs clearance for lap chole Chief Complaint  Patient presents with  . Acute Visit    Increased SOB for the past 2 months. She has been having some PND and sinus pressure. She has occ cough with white sputum. She is using her rescue inhaler 2 x daily and she uses neb rarely.  Dyspnea:  7-11 not HT on 2lpm Cough: no. Just pnd issues   Sleeping: one pillow on L side done flat bed  SABA use: as above/ hfa poor 02: 2lpm 24/7 poc    No obvious day to day or daytime variability or assoc excess/ purulent sputum or mucus plugs or hemoptysis or cp or chest tightness,  subjective wheeze or overt sinus or hb symptoms.   Sleeping as above without nocturnal  or early am exacerbation  of respiratory  c/o's or need for noct saba. Also denies any  obvious fluctuation of symptoms with weather or environmental changes or other aggravating or alleviating factors except as outlined above   No unusual exposure hx or h/o childhood pna/ asthma or knowledge of premature birth.  Current Allergies, Complete Past Medical History, Past Surgical History, Family History, and Social History were reviewed in [employer] record.  ROS  The following are not active complaints unless bolded Hoarseness, sore throat, dysphagia, dental problems, itching, sneezing,  nasal congestion or discharge of excess mucus or purulent secretions, ear ache,   fever, chills, sweats, unintended wt loss or wt gain, classically pleuritic or exertional cp,  orthopnea pnd or arm/hand swelling  or leg swelling, presyncope, palpitations, abdominal pain, anorexia, nausea, vomiting, diarrhea  or change in bowel habits or change in bladder habits, change in stools or change in urine, dysuria, hematuria,  rash, arthralgias, visual complaints, headache, numbness, weakness or ataxia or problems with walking or coordination,  change in mood or  memory.        Current Meds  Medication Sig  . acetaminophen (TYLENOL) 500 MG tablet Take 1,000 mg by mouth every 6 (six) hours as needed for mild pain.   [name] albuterol (PROVENTIL HFA;VENTOLIN HFA) 108 (90 Base) MCG/ACT inhaler Inhale 2 puffs into the lungs every 6 (six) hours as needed for wheezing or shortness of breath.  [name] albuterol (PROVENTIL) (2.5 MG/3ML) 0.083% nebulizer solution Take 2.5 mg by nebulization every 6 (six) hours as needed for wheezing or shortness of breath.  . AMBULATORY NON FORMULARY MEDICATION GI Cocktail - 90 ml of 2% lidocaine                      90 ml Dicyclomine 10 mg/5 ml                     270 ml Maalox   - take 5-10 ml as  needed for severe pain  . aspirin EC 81 MG tablet Take 1 tablet (81 mg total) by mouth daily.  . bisoprolol (ZEBETA) 5 MG tablet TAKE 1 TABLET EVERY DAY (Patient taking differently: Take 5 mg by mouth at bedtime. )  . budesonide-formoterol (SYMBICORT) 160-4.5 MCG/ACT inhaler Inhale 2 puffs into the lungs 2 (two) times daily.  . cetirizine (ZYRTEC) 10 MG tablet Take 10 mg by mouth at bedtime.   . clopidogrel (PLAVIX) 75 MG tablet TAKE 1 TABLET EVERY DAY (Patient taking differently: Take 75 mg by mouth every evening. )  . clotrimazole (LOTRIMIN) 1 % cream Apply 1 application topically 2 (two) times daily. (Patient taking differently: Apply 1 application topically 2 (two) times daily as needed (rash). )  . clotrimazole (MYCELEX) 10 MG troche Take 10 mg by mouth as needed (Sore Mouth).  . colchicine 0.6 MG tablet Take 0.6 mg by mouth as needed (gout).  [name] diphenhydrAMINE (BENADRYL) 25 MG tablet Take 25 mg by mouth every 6 (six) hours as needed for allergies.  [name] docusate sodium (COLACE) 100 MG capsule Take 200 mg by mouth at bedtime.   [name] EPINEPHrine (EPIPEN 2-PAK) 0.3 mg/0.3 mL IJ SOAJ injection Inject 0.3 mg into the muscle once as needed (severe allergic reaction).  . ezetimibe (ZETIA) 10 MG tablet Take 1 tablet (10 mg total) by mouth daily.  . famotidine (PEPCID) 40 MG tablet Take 1 tablet (40 mg total) by mouth at bedtime.  . febuxostat (ULORIC) 40 MG tablet Take 40 mg by mouth daily.  . fluticasone (FLONASE) 50 MCG/ACT nasal spray Place  1 spray into both nostrils at bedtime as needed for rhinitis or allergies.   [name] guaiFENesin (MUCINEX) 600 MG 12 hr tablet Take 600 mg by mouth at bedtime.   . hydrocortisone 2.5 % cream Apply 2.5 application topically as needed.  . hydroxypropyl methylcellulose / hypromellose (ISOPTO TEARS / GONIOVISC) 2.5 % ophthalmic solution Place 1 drop into both eyes 3 (three) times daily as needed for dry eyes.   [name] ipratropium (ATROVENT) 0.02 % nebulizer solution Take 2.5 mLs  (0.5 mg total) by nebulization 4 (four) times daily.  [name] ipratropium (ATROVENT) 0.06 % nasal spray 1-2 pffs up to 4 x daily as needed (Patient taking differently: Place 2 sprays into both nostrils 4 (four) times daily as needed for rhinitis. )  . isosorbide mononitrate (IMDUR) 60 MG 24 hr tablet Take 1 tablet (60 mg total) by mouth daily.  [name] ketotifen (ZADITOR) 0.025 % ophthalmic solution Place 1 drop into both eyes 2 (two) times daily.  . metolazone (ZAROXOLYN) 5 MG tablet Take 1 tablet (5 mg total) by mouth as needed. Take only as directed by Heart Failure Clinic (Patient taking differently: Take 5 mg by mouth daily as needed (for weight gain of 3 LBS. or more). )  . Multiple Vitamins-Minerals (ALIVE WOMENS ENERGY PO) Take 1 tablet by mouth daily.  . nitroGLYCERIN (NITROSTAT) 0.4 MG SL tablet Place 1 tablet (0.4 mg total) under the tongue every 5 (five) minutes as needed for chest pain.  [name] ondansetron (ZOFRAN ODT) 4 MG disintegrating tablet Take 1 tablet (4 mg total) by mouth every 8 (eight) hours as needed.  . OXYGEN 2 lpm with sleep and 3 with exertion- [facility]    Ordered by cards  . pantoprazole (PROTONIX) 40 MG tablet TAKE 1 TABLET EVERY DAY (Patient taking differently: Take 40 mg by mouth 2 (two) times daily. )  . potassium chloride SA (K-DUR,KLOR-CON) 20 MEQ tablet TAKE 1 TABLET EVERY DAY (Patient taking differently: Take 20 mEq by mouth daily. )  . RESTASIS 0.05 % ophthalmic emulsion Place 1 drop into both eyes 2 (two) times daily.   . rosuvastatin (CRESTOR) 20 MG tablet TAKE 1 TABLET (20 MG TOTAL) BY MOUTH AT BEDTIME.  . saxagliptin HCl (ONGLYZA) 2.5 MG TABS tablet Take 2.5 mg by mouth daily.   [name] spironolactone (ALDACTONE) 25 MG tablet TAKE 1 TABLET EVERY DAY (Patient taking differently: Take 25 mg by mouth at bedtime. )  . torsemide (DEMADEX) 20 MG tablet TAKE 40 MG (2TAB) EVERY OTHER DAY ALTERNATING WITH 60 MG EVERY OTHER DAY (Patient taking differently: Take 20-40 mg by mouth See admin  instructions. TAKE 40 MG in the morning 20 mg in the evening)  . triamcinolone cream (KENALOG) 0.1 % Apply 1 application topically 2 (two) times daily as needed (itching).                              Objective:   Physical Exam   Elderly obese wf nad   03/05/2018      249  10/16/2016         261  08/29/2016       256  10/10/2013       243     09/08/13 248 lb (112.492 kg)  09/07/13 243 lb 4.8 oz (110.36 kg)  09/01/13 249 lb 12.8 oz (113.309 kg)     Vital signs reviewed - Note on arrival 02 sats  99% on 2lpm  HEENT: nl dentition, turbinates bilaterally, and oropharynx. Nl external ear canals without cough reflex   NECK :  without JVD/Nodes/TM/ nl carotid upstrokes bilaterally   LUNGS: no acc muscle use,  Nl contour chest which is clear to A and P bilaterally without cough on insp or exp maneuvers   CV:  RRR  no s3 or murmur or increase in P2, and trace edema both LE's  ABD:  Tensely obese/   nontender with nl inspiratory excursion in the supine position. No bruits or organomegaly appreciated, bowel sounds nl  MS:  Nl gait/ ext warm without deformities, calf tenderness, cyanosis or clubbing No obvious joint restrictions   SKIN: warm and dry without lesions    NEURO:  alert, approp, nl sensorium with  no motor or cerebellar deficits apparent.               I personally reviewed images and agree with radiology impression as follows:  CXR:   02/03/18  No acute findings     Assessment & Plan:

## 2018-03-07 ENCOUNTER — Encounter: Payer: Self-pay | Admitting: Internal Medicine

## 2018-03-07 DIAGNOSIS — J9611 Chronic respiratory failure with hypoxia: Secondary | ICD-10-CM | POA: Insufficient documentation

## 2018-03-07 DIAGNOSIS — J4520 Mild intermittent asthma, uncomplicated: Secondary | ICD-10-CM | POA: Diagnosis not present

## 2018-03-07 DIAGNOSIS — R269 Unspecified abnormalities of gait and mobility: Secondary | ICD-10-CM | POA: Diagnosis not present

## 2018-03-07 DIAGNOSIS — R0602 Shortness of breath: Secondary | ICD-10-CM | POA: Diagnosis not present

## 2018-03-07 DIAGNOSIS — I5032 Chronic diastolic (congestive) heart failure: Secondary | ICD-10-CM | POA: Diagnosis not present

## 2018-03-07 DIAGNOSIS — J9690 Respiratory failure, unspecified, unspecified whether with hypoxia or hypercapnia: Secondary | ICD-10-CM | POA: Insufficient documentation

## 2018-03-07 DIAGNOSIS — J9610 Chronic respiratory failure, unspecified whether with hypoxia or hypercapnia: Secondary | ICD-10-CM | POA: Insufficient documentation

## 2018-03-07 DIAGNOSIS — J9612 Chronic respiratory failure with hypercapnia: Secondary | ICD-10-CM

## 2018-03-07 DIAGNOSIS — I10 Essential (primary) hypertension: Secondary | ICD-10-CM | POA: Diagnosis not present

## 2018-03-07 NOTE — Assessment & Plan Note (Addendum)
ERV 22% by pfts 10/16/2016 c/w obesity effects    Body mass index is 40.19 kg/m.  -  trending down, encouraged  Lab Results  Component Value Date   TSH 3.677 02/21/2015     Contributing to gerd risk/ doe/reviewed the need and the process to achieve and maintain neg calorie balance > defer f/u primary care including intermittently monitoring thyroid status

## 2018-03-07 NOTE — Assessment & Plan Note (Signed)
10/10/2013  Walked RA @ nl pace x  2 laps @ 185 ft each stopped due to  Sob, no desat -  08/29/2016   Walked RA  2 laps @ 185 ft each stopped due to  Moderately fast pace and sob/ sat 91%  - Spirometry 08/29/2016  FEV1 1.23 (51%)  Ratio 86  with no  curvature  On symbicort 160 x 2 but poor hfa - FENO 08/29/2016  =   28 on symb 160 with poor hfa - 08/29/2016  After extensive coaching HFA effectiveness =    75% from a baseline of 25% so try symb 80 2bid - PFT's  10/16/2016  FEV1 1.31 (68 % ) ratio 93  p no  % improvement from saba p nothing prior to study with DLCO  78/75 % corrects to 118  % for alv volume  And ERV  22%  - 03/05/2018  After extensive coaching inhaler device,  effectiveness =    25%   Given how poorly she uses hfa I strongly doubt there is any active asthma here and the main problem she has is again restrictive related to obesity which would be her greatest challenge with any abd surgery, esp Lap chole (ie upper abdominal)   There is certainly a risk of post op complications including resp failure and may well need a bridge with bipap until fully mobile but this risk is not prohibitive and the risk of not doing surgery is greater esp if then has complications from GB dz  and needs surgery anyway.  Discussed in detail all the  indications, usual  risks and alternatives  relative to the benefits with patient who agrees to proceed with surgery    I had an extended discussion with the patient reviewing all releva

## 2018-03-07 NOTE — Assessment & Plan Note (Signed)
-   10/10/2013 PFT's no sign airflow obst p am symbicort 160 > try symbicort 80 2bid  - 10/16/2016 pfts s obst s am symbicort  - 10/16/2016  After extensive coaching HFA effectiveness =    25% > ok to continue symbicort 160 since worse on 80 2bid at pt request though doubt much asthma here and pulmonary f/u prn   No evidence active asthma/ just some pnds rx with otc  meds reviewed

## 2018-03-07 NOTE — Assessment & Plan Note (Signed)
HC03  02/14/2018  = 33    Adequate control on present rx, reviewed in detail with pt > no change in rx needed    Will need early mobilization / min sedation post op and bipap prn / advised    I had an extended discussion with the patient reviewing all relevant studies completed to date and  lasting 15 to 20 minutes of a 25 minute visit    See device teaching which extended face to face time for this visit.  Each maintenance medication was reviewed in detail including emphasizing most importantly the difference between maintenance and prns and under what circumstances the prns are to be triggered using an action plan format that is not reflected in the computer generated alphabetically organized AVS which I have not found useful in most complex patients, especially with respiratory illnesses  Please see AVS for specific instructions unique to this visit that I personally wrote and verbalized to the the pt in detail and then reviewed with pt  by my nurse highlighting any  changes in therapy recommended at today's visit to their plan of care.

## 2018-03-10 ENCOUNTER — Ambulatory Visit: Payer: Self-pay | Admitting: General Surgery

## 2018-03-10 NOTE — H&P (Signed)
[name] Documented: 02/12/2018 10:49 AM Location: [facility] Patient #: [id number] DOB: 1945-05-26 Widowed / Language: Cleophus Molt / Race: White Female   History of Present Illness Randall Hiss M. Azyriah Nevins MD; 02/12/2018 11:32 AM) The patient is a 73 year old female who presents for evaluation of gallbladder disease. She is referred by Dr Inda Merlin for evaluation of gallstones. The patient reports a 5-6 month history of postprandial epigastric pain. It generally occurs after eating. The increased discomfort will last for a few hours but then she may have mild discomfort that lasts a day or so. It generally occurs with eating fried and fatty foods. She has cut back on those types of foods to do ongoing heart issues. She will generally had nausea with it. Occasionally will radiate to her back. She had an upper and lower endoscopy which showed grade D esophagitis and mild gastritis and a small hiatal hernia. She was placed on reflux medication with no change or improvement in her symptoms. She denies any diarrhea or constipation. She denies any acholic stools. She is on Plavix. She uses oxygen at night and sometimes during the daytime. She occasionally still has ongoing angina. She takes a daily angina pill. She had a cardiac catheterization back in the fall. She had normal liver function tests recently. She had an abdominal ultrasound which confirmed cholelithiasis and a small gallbladder polyp. She is accompanied by her son   Problem List/Past Medical Randall Hiss M. Redmond Pulling, MD; 02/12/2018 11:32 AM) SYMPTOMATIC CHOLELITHIASIS (K80.20)  ANTIPLATELET OR ANTITHROMBOTIC LONG-TERM USE (Z79.02)  SEVERE OBESITY (E66.01)  CAD, MULTIPLE VESSEL (I25.10)   Diagnostic Studies History Randall Hiss M. Redmond Pulling, MD; 02/12/2018 11:25 AM) Colonoscopy  within last year  Allergies (Tanisha A. Owens Shark, [place]; 02/12/2018 10:54 AM) Shellfish-derived Products  Allopurinol  Doxycycline *DERMATOLOGICALS*  Keflex  *CEPHALOSPORINS*  Azithromycin *CHEMICALS*  Dyes  IVP Sulfa Antibiotics  Allergies Reconciled   Medication History (Tanisha A. Owens Shark, [place]; 02/12/2018 11:00 AM) Colchicine (0.6MG  Tablet, Oral) Active. Clotrimazole (1% Cream, External) Active. Clotrimazole (10MG  Lozenge, Mouth/Throat) Active. Bisoprolol Fumarate (5MG  Tablet, Oral) Active. Benadryl Allergy (25MG  Tablet, Oral) Active. Aspirin (81MG  Tablet, Oral) Active. Ventolin HFA (108 (90 Base)MCG/ACT Aerosol Soln, Inhalation) Active. Albuterol Sulfate ((2.5 MG/3ML)0.083% Nebulized Soln, Inhalation) Active. Albuterol Sulfate HFA (108 (90 Base)MCG/ACT Aerosol Soln, Inhalation) Active. Ondansetron HCl (4MG  Tablet, Oral) Active. Uloric (40MG  Tablet, Oral) Active. Torsemide (20MG  Tablet, Oral) Active. Symbicort (160-4.5MCG/ACT Aerosol, Inhalation) Active. Spironolactone (25MG  Tablet, Oral) Active. Rosuvastatin Calcium (20MG  Tablet, Oral) Active. Clopidogrel Bisulfate (75MG  Tablet, Oral) Active. Pantoprazole Sodium (40MG  Tablet DR, Oral) Active. Nitroglycerin (0.4MG  Tab Sublingual, Sublingual) Active. Multi-Vitamin (Oral) Active. Mucinex Allergy (180MG  Tablet, Oral) Active. metOLazone (5MG  Tablet, Oral) Active. Klor-Con [facility] Packet, Oral) Active. Ezetimibe (10MG  Tablet, Oral) Active. EpiPen (0.3MG /0.3ML Device, Injection) Active. Medications Reconciled  Family History Randall Hiss M. Redmond Pulling, MD; 02/12/2018 11:25 AM) Arthritis  Father. Heart Disease  Father, Son.  Pregnancy / Birth History Randall Hiss M. Redmond Pulling, MD; 02/12/2018 11:25 AM) Age at menarche  52 years. Irregular periods  Maternal age  100-20  Other Problems Randall Hiss M. Redmond Pulling, MD; 02/12/2018 11:32 AM) Back Pain  Congestive Heart Failure  Gastroesophageal Reflux Disease  Hemorrhoids  Home Oxygen Use  Sleep Apnea  High blood pressure     Review of Systems Randall Hiss M. Patriciaann Rabanal MD; 02/12/2018 11:25 AM) Breast Not Present- Breast Mass, Breast Pain,  Nipple Discharge and Skin Changes. Cardiovascular Present- Shortness of Breath and Swelling of Extremities. Not Present- Chest Pain, Difficulty Breathing Lying Down, Leg Cramps, Palpitations and Rapid Heart  Rate. Gastrointestinal Present- Abdominal Pain and Indigestion. Not Present- Bloating, Bloody Stool, Change in Bowel Habits, Chronic diarrhea, Constipation, Difficulty Swallowing, Excessive gas, Gets full quickly at meals, Hemorrhoids, Nausea, Rectal Pain and Vomiting. Musculoskeletal Present- Back Pain. Not Present- Joint Pain, Joint Stiffness, Muscle Pain, Muscle Weakness and Swelling of Extremities. Hematology Present- Blood Thinners. Not Present- Easy Bruising, Excessive bleeding, Gland problems, HIV and Persistent Infections.  Vitals (Tanisha A. Brown RMA; 02/12/2018 10:50 AM) 02/12/2018 10:50 AM Weight: 245.8 lb Height: 62in Body Surface Area: 2.09 m Body Mass Index: 44.96 kg/m  Temp.: 98.26F  Pulse: 85 (Regular)  BP: 134/86 (Sitting, Left Arm, Standard)       Physical Exam Randall Hiss M. Atalie Oros MD; 02/12/2018 11:30 AM) General Mental Status-Alert. General Appearance-Consistent with stated age. Hydration-Well hydrated. Voice-Normal. Note: central truncal obesity   Head and Neck Head-normocephalic, atraumatic with no lesions or palpable masses. Trachea-midline. Thyroid Gland Characteristics - normal size and consistency.  Eye Eyeball - Bilateral-Extraocular movements intact. Sclera/Conjunctiva - Bilateral-No scleral icterus.  ENMT Ears -Note: nml ext ears.  Mouth and Throat -Note: lips intact.   Chest and Lung Exam Chest and lung exam reveals -quiet, even and easy respiratory effort with no use of accessory muscles and on auscultation, normal breath sounds, no adventitious sounds and normal vocal resonance. Inspection Chest Wall - Normal. Back - normal. Note: old sternal incision   Breast - Did not  examine.  Cardiovascular Cardiovascular examination reveals -normal heart sounds, regular rate and rhythm with no murmurs and normal pedal pulses bilaterally.  Abdomen Inspection Inspection of the abdomen reveals - No Hernias. Skin - Scar - no surgical scars. Palpation/Percussion Palpation and Percussion of the abdomen reveal - Soft, No Rebound tenderness, No Rigidity (guarding) and No hepatosplenomegaly. Note: mild upper abd TTP. Auscultation Auscultation of the abdomen reveals - Bowel sounds normal.  Peripheral Vascular Upper Extremity Palpation - Pulses bilaterally normal.  Neurologic Neurologic evaluation reveals -alert and oriented x 3 with no impairment of recent or remote memory. Mental Status-Normal.  Neuropsychiatric The patient's mood and affect are described as -normal. Judgment and Insight-insight is appropriate concerning matters relevant to self.  Musculoskeletal Normal Exam - Left-Upper Extremity Strength Normal and Lower Extremity Strength Normal. Normal Exam - Right-Upper Extremity Strength Normal and Lower Extremity Strength Normal.  Lymphatic Head & Neck  General Head & Neck Lymphatics: Bilateral - Description - Normal. Axillary - Did not examine. Femoral & Inguinal - Did not examine.    Assessment & Plan Randall Hiss M. Brayant Dorr MD; 02/12/2018 11:28 AM) SYMPTOMATIC CHOLELITHIASIS (K80.20) Impression: I believe most of the patient's symptoms are consistent with gallbladder disease.  We discussed gallbladder disease. The patient was given Neurosurgeon. We discussed non-operative and operative management. We discussed the signs & symptoms of acute cholecystitis  I discussed laparoscopic cholecystectomy with IOC in detail. The patient was given educational material as well as diagrams detailing the procedure. We discussed the risks and benefits of a laparoscopic cholecystectomy including, but not limited to bleeding, infection, injury to  surrounding structures such as the intestine or liver, bile leak, retained gallstones, need to convert to an open procedure, prolonged diarrhea, blood clots such as DVT, common bile duct injury, anesthesia risks, and possible need for additional procedures. We discussed the typical post-operative recovery course. I explained that the likelihood of improvement of their symptoms is good.  we did discuss that gb surgery may not ameliorate her back pain  The patient has elected to proceed with surgery once we have obtained  cardiac clearance and permission to hold plavix Current Plans Pt Education - Pamphlet Given - Laparoscopic Gallbladder Surgery: discussed with patient and provided information. You are being scheduled for surgery- Our schedulers will call youONCE [place]  If you have other questions about your diagnosis, plan, or surgery, call the office and ask for your surgeon's nurse.  ESSENTIAL HYPERTENSION (I10) Home Oxygen Use SLEEP APNEA IN ADULT (G47.30) CAD, MULTIPLE VESSEL (I25.10) SEVERE OBESITY (E66.01) ANTIPLATELET OR ANTITHROMBOTIC LONG-TERM USE (Z79.02) Impression: We will need permission to hold Plavix perioperatively  Leighton Ruff. Redmond Pulling, MD, FACS General, Bariatric, & Minimally Invasive Surgery [facility], [place]

## 2018-03-10 NOTE — H&P (View-Only) (Signed)
[name] Documented: 02/12/2018 10:49 AM Location: [facility] Patient #: [id number] DOB: 1945/11/04 Widowed / Language: Cleophus Molt / Race: White Female   History of Present Illness Randall Hiss M. Rojelio Uhrich MD; 02/12/2018 11:32 AM) The patient is a 73 year old female who presents for evaluation of gallbladder disease. She is referred by Dr Inda Merlin for evaluation of gallstones. The patient reports a 5-6 month history of postprandial epigastric pain. It generally occurs after eating. The increased discomfort will last for a few hours but then she may have mild discomfort that lasts a day or so. It generally occurs with eating fried and fatty foods. She has cut back on those types of foods to do ongoing heart issues. She will generally had nausea with it. Occasionally will radiate to her back. She had an upper and lower endoscopy which showed grade D esophagitis and mild gastritis and a small hiatal hernia. She was placed on reflux medication with no change or improvement in her symptoms. She denies any diarrhea or constipation. She denies any acholic stools. She is on Plavix. She uses oxygen at night and sometimes during the daytime. She occasionally still has ongoing angina. She takes a daily angina pill. She had a cardiac catheterization back in the fall. She had normal liver function tests recently. She had an abdominal ultrasound which confirmed cholelithiasis and a small gallbladder polyp. She is accompanied by her son   Problem List/Past Medical Randall Hiss M. Redmond Pulling, MD; 02/12/2018 11:32 AM) SYMPTOMATIC CHOLELITHIASIS (K80.20)  ANTIPLATELET OR ANTITHROMBOTIC LONG-TERM USE (Z79.02)  SEVERE OBESITY (E66.01)  CAD, MULTIPLE VESSEL (I25.10)   Diagnostic Studies History Randall Hiss M. Redmond Pulling, MD; 02/12/2018 11:25 AM) Colonoscopy  within last year  Allergies (Tanisha A. Owens Shark, [place]; 02/12/2018 10:54 AM) Shellfish-derived Products  Allopurinol  Doxycycline *DERMATOLOGICALS*  Keflex  *CEPHALOSPORINS*  Azithromycin *CHEMICALS*  Dyes  IVP Sulfa Antibiotics  Allergies Reconciled   Medication History (Tanisha A. Owens Shark, [place]; 02/12/2018 11:00 AM) Colchicine (0.6MG  Tablet, Oral) Active. Clotrimazole (1% Cream, External) Active. Clotrimazole (10MG  Lozenge, Mouth/Throat) Active. Bisoprolol Fumarate (5MG  Tablet, Oral) Active. Benadryl Allergy (25MG  Tablet, Oral) Active. Aspirin (81MG  Tablet, Oral) Active. Ventolin HFA (108 (90 Base)MCG/ACT Aerosol Soln, Inhalation) Active. Albuterol Sulfate ((2.5 MG/3ML)0.083% Nebulized Soln, Inhalation) Active. Albuterol Sulfate HFA (108 (90 Base)MCG/ACT Aerosol Soln, Inhalation) Active. Ondansetron HCl (4MG  Tablet, Oral) Active. Uloric (40MG  Tablet, Oral) Active. Torsemide (20MG  Tablet, Oral) Active. Symbicort (160-4.5MCG/ACT Aerosol, Inhalation) Active. Spironolactone (25MG  Tablet, Oral) Active. Rosuvastatin Calcium (20MG  Tablet, Oral) Active. Clopidogrel Bisulfate (75MG  Tablet, Oral) Active. Pantoprazole Sodium (40MG  Tablet DR, Oral) Active. Nitroglycerin (0.4MG  Tab Sublingual, Sublingual) Active. Multi-Vitamin (Oral) Active. Mucinex Allergy (180MG  Tablet, Oral) Active. metOLazone (5MG  Tablet, Oral) Active. Klor-Con [facility] Packet, Oral) Active. Ezetimibe (10MG  Tablet, Oral) Active. EpiPen (0.3MG /0.3ML Device, Injection) Active. Medications Reconciled  Family History Randall Hiss M. Redmond Pulling, MD; 02/12/2018 11:25 AM) Arthritis  Father. Heart Disease  Father, Son.  Pregnancy / Birth History Randall Hiss M. Redmond Pulling, MD; 02/12/2018 11:25 AM) Age at menarche  73 years. Irregular periods  Maternal age  21-20  Other Problems Randall Hiss M. Redmond Pulling, MD; 02/12/2018 11:32 AM) Back Pain  Congestive Heart Failure  Gastroesophageal Reflux Disease  Hemorrhoids  Home Oxygen Use  Sleep Apnea  High blood pressure     Review of Systems Randall Hiss M. Kalkidan Caudell MD; 02/12/2018 11:25 AM) Breast Not Present- Breast Mass, Breast Pain,  Nipple Discharge and Skin Changes. Cardiovascular Present- Shortness of Breath and Swelling of Extremities. Not Present- Chest Pain, Difficulty Breathing Lying Down, Leg Cramps, Palpitations and Rapid Heart  Rate. Gastrointestinal Present- Abdominal Pain and Indigestion. Not Present- Bloating, Bloody Stool, Change in Bowel Habits, Chronic diarrhea, Constipation, Difficulty Swallowing, Excessive gas, Gets full quickly at meals, Hemorrhoids, Nausea, Rectal Pain and Vomiting. Musculoskeletal Present- Back Pain. Not Present- Joint Pain, Joint Stiffness, Muscle Pain, Muscle Weakness and Swelling of Extremities. Hematology Present- Blood Thinners. Not Present- Easy Bruising, Excessive bleeding, Gland problems, HIV and Persistent Infections.  Vitals (Tanisha A. Brown RMA; 02/12/2018 10:50 AM) 02/12/2018 10:50 AM Weight: 245.8 lb Height: 62in Body Surface Area: 2.09 m Body Mass Index: 44.96 kg/m  Temp.: 98.20F  Pulse: 85 (Regular)  BP: 134/86 (Sitting, Left Arm, Standard)       Physical Exam Randall Hiss M. Norberto Wishon MD; 02/12/2018 11:30 AM) General Mental Status-Alert. General Appearance-Consistent with stated age. Hydration-Well hydrated. Voice-Normal. Note: central truncal obesity   Head and Neck Head-normocephalic, atraumatic with no lesions or palpable masses. Trachea-midline. Thyroid Gland Characteristics - normal size and consistency.  Eye Eyeball - Bilateral-Extraocular movements intact. Sclera/Conjunctiva - Bilateral-No scleral icterus.  ENMT Ears -Note: nml ext ears.  Mouth and Throat -Note: lips intact.   Chest and Lung Exam Chest and lung exam reveals -quiet, even and easy respiratory effort with no use of accessory muscles and on auscultation, normal breath sounds, no adventitious sounds and normal vocal resonance. Inspection Chest Wall - Normal. Back - normal. Note: old sternal incision   Breast - Did not  examine.  Cardiovascular Cardiovascular examination reveals -normal heart sounds, regular rate and rhythm with no murmurs and normal pedal pulses bilaterally.  Abdomen Inspection Inspection of the abdomen reveals - No Hernias. Skin - Scar - no surgical scars. Palpation/Percussion Palpation and Percussion of the abdomen reveal - Soft, No Rebound tenderness, No Rigidity (guarding) and No hepatosplenomegaly. Note: mild upper abd TTP. Auscultation Auscultation of the abdomen reveals - Bowel sounds normal.  Peripheral Vascular Upper Extremity Palpation - Pulses bilaterally normal.  Neurologic Neurologic evaluation reveals -alert and oriented x 3 with no impairment of recent or remote memory. Mental Status-Normal.  Neuropsychiatric The patient's mood and affect are described as -normal. Judgment and Insight-insight is appropriate concerning matters relevant to self.  Musculoskeletal Normal Exam - Left-Upper Extremity Strength Normal and Lower Extremity Strength Normal. Normal Exam - Right-Upper Extremity Strength Normal and Lower Extremity Strength Normal.  Lymphatic Head & Neck  General Head & Neck Lymphatics: Bilateral - Description - Normal. Axillary - Did not examine. Femoral & Inguinal - Did not examine.    Assessment & Plan Randall Hiss M. Casara Perrier MD; 02/12/2018 11:28 AM) SYMPTOMATIC CHOLELITHIASIS (K80.20) Impression: I believe most of the patient's symptoms are consistent with gallbladder disease.  We discussed gallbladder disease. The patient was given Neurosurgeon. We discussed non-operative and operative management. We discussed the signs & symptoms of acute cholecystitis  I discussed laparoscopic cholecystectomy with IOC in detail. The patient was given educational material as well as diagrams detailing the procedure. We discussed the risks and benefits of a laparoscopic cholecystectomy including, but not limited to bleeding, infection, injury to  surrounding structures such as the intestine or liver, bile leak, retained gallstones, need to convert to an open procedure, prolonged diarrhea, blood clots such as DVT, common bile duct injury, anesthesia risks, and possible need for additional procedures. We discussed the typical post-operative recovery course. I explained that the likelihood of improvement of their symptoms is good.  we did discuss that gb surgery may not ameliorate her back pain  The patient has elected to proceed with surgery once we have obtained  cardiac clearance and permission to hold plavix Current Plans Pt Education - Pamphlet Given - Laparoscopic Gallbladder Surgery: discussed with patient and provided information. You are being scheduled for surgery- Our schedulers will call youONCE [place]  If you have other questions about your diagnosis, plan, or surgery, call the office and ask for your surgeon's nurse.  ESSENTIAL HYPERTENSION (I10) Home Oxygen Use SLEEP APNEA IN ADULT (G47.30) CAD, MULTIPLE VESSEL (I25.10) SEVERE OBESITY (E66.01) ANTIPLATELET OR ANTITHROMBOTIC LONG-TERM USE (Z79.02) Impression: We will need permission to hold Plavix perioperatively  Leighton Ruff. Redmond Pulling, MD, FACS General, Bariatric, & Minimally Invasive Surgery [facility], [place]

## 2018-03-17 ENCOUNTER — Telehealth: Payer: Self-pay | Admitting: Internal Medicine

## 2018-03-17 MED ORDER — PREDNISONE 10 MG PO TABS: ORAL_TABLET | ORAL | 0 refills | Status: DC

## 2018-03-17 MED ORDER — LEVOFLOXACIN 500 MG PO TABS: 500.0000 mg | ORAL_TABLET | Freq: Every day | ORAL | 0 refills | Status: DC

## 2018-03-17 NOTE — Telephone Encounter (Signed)
Unfortunately she has multiple allergies listed that make it difficult to know what would be safest to take here.  If she has green mucus she probably needs an antibiotic.  If she has used Augmentin safely since the time she reported an allergy to cephalosporin then it is fine to take: Augmentin 875 mg take one pill twice daily  X 10 days - take at breakfast and supper with large glass of water.  It would help reduce the usual side effects (diarrhea and yeast infections) if you ate cultured yogurt at lunch.   If she has not taken Augmentin since she reported the cephalosporin allergy then she cannot take Augmentin and should take Levaquin 500 mg 1 daily for 7 days and Prednisone 10 mg take  4 each am x 2 days,   2 each am x 2 days,  1 each am x 2 days and stop

## 2018-03-17 NOTE — Telephone Encounter (Signed)
Spoke with pt and relayed Dr wert's recommendations.  Rx for levaquin and prednisone sent to preferred pharmacy.  She was unable to take augmentin.  Pt verbalized understanding.  Nothing further is needed.

## 2018-03-17 NOTE — Telephone Encounter (Signed)
Primary Pulmonologist: MW Last office visit and with whom: 03/05/18 MW What do we see them for (pulmonary problems): DOE Last OV assessment/plan: Unless you are having flare of cough/ wheezing you are cleared for surgery.   There will be a higher risk of post op complications due to your weight that can be offset by using minimal sedation  and pain meds and perhabs bipap until you are out of bed which should happen asap and our team can see you as needed.  Work on inhaler technique:  relax and gently blow all the way out then take a nice smooth deep breath back in, triggering the inhaler at same time you start breathing in.  Hold for up to 5 seconds if you can.   Rinse and gargle with water when done.  Pulmonary follow up is as needed   Was appointment offered to patient (explain)?  Yes but pt did not feel like coming in   Reason for call: increased wheezing.  Pt had sinus infection 2 weeks ago.  She now states she has increased wheezing, increased SOB, and increased cough with green mucus x3 days.  She has head congestion with clear mucus.  She is taking her symbicort ad directed.  She has used her albuterol inhaler about every 4 hours and is using her albuterol nebs twice a day.  Pt is taking advil cold and sinus, along with mucinex , flonase, and zyrtec.  Pt has increased oxygen to 3L.  Pt is having gallbladder surgery in 2 weeks.  Dr. Melvyn Novas, please advise.

## 2018-03-25 DIAGNOSIS — M109 Gout, unspecified: Secondary | ICD-10-CM | POA: Diagnosis not present

## 2018-03-25 DIAGNOSIS — L03115 Cellulitis of right lower limb: Secondary | ICD-10-CM | POA: Diagnosis not present

## 2018-03-31 DIAGNOSIS — J4520 Mild intermittent asthma, uncomplicated: Secondary | ICD-10-CM | POA: Diagnosis not present

## 2018-03-31 DIAGNOSIS — I5032 Chronic diastolic (congestive) heart failure: Secondary | ICD-10-CM | POA: Diagnosis not present

## 2018-03-31 DIAGNOSIS — N183 Chronic kidney disease, stage 3 (moderate): Secondary | ICD-10-CM | POA: Diagnosis not present

## 2018-03-31 DIAGNOSIS — I2510 Atherosclerotic heart disease of native coronary artery without angina pectoris: Secondary | ICD-10-CM | POA: Diagnosis not present

## 2018-03-31 DIAGNOSIS — E1122 Type 2 diabetes mellitus with diabetic chronic kidney disease: Secondary | ICD-10-CM | POA: Diagnosis not present

## 2018-03-31 DIAGNOSIS — E1169 Type 2 diabetes mellitus with other specified complication: Secondary | ICD-10-CM | POA: Diagnosis not present

## 2018-03-31 DIAGNOSIS — I10 Essential (primary) hypertension: Secondary | ICD-10-CM | POA: Diagnosis not present

## 2018-03-31 DIAGNOSIS — I5033 Acute on chronic diastolic (congestive) heart failure: Secondary | ICD-10-CM | POA: Diagnosis not present

## 2018-04-01 ENCOUNTER — Other Ambulatory Visit: Payer: Self-pay | Admitting: Family Medicine

## 2018-04-01 ENCOUNTER — Ambulatory Visit
Admission: RE | Admit: 2018-04-01 | Discharge: 2018-04-01 | Disposition: A | Discharge status: Home / Self Care | Payer: Medicare HMO | Source: Ambulatory Visit | Attending: Family Medicine | Admitting: Family Medicine

## 2018-04-01 DIAGNOSIS — M25532 Pain in left wrist: Secondary | ICD-10-CM

## 2018-04-01 DIAGNOSIS — J4530 Mild persistent asthma, uncomplicated: Secondary | ICD-10-CM | POA: Diagnosis not present

## 2018-04-01 DIAGNOSIS — R269 Unspecified abnormalities of gait and mobility: Secondary | ICD-10-CM | POA: Diagnosis not present

## 2018-04-01 DIAGNOSIS — I5032 Chronic diastolic (congestive) heart failure: Secondary | ICD-10-CM | POA: Diagnosis not present

## 2018-04-01 DIAGNOSIS — E1122 Type 2 diabetes mellitus with diabetic chronic kidney disease: Secondary | ICD-10-CM | POA: Diagnosis not present

## 2018-04-01 DIAGNOSIS — R0602 Shortness of breath: Secondary | ICD-10-CM | POA: Diagnosis not present

## 2018-04-01 DIAGNOSIS — N183 Chronic kidney disease, stage 3 (moderate): Secondary | ICD-10-CM | POA: Diagnosis not present

## 2018-04-01 DIAGNOSIS — S6992XA Unspecified injury of left wrist, hand and finger(s), initial encounter: Secondary | ICD-10-CM | POA: Diagnosis not present

## 2018-04-01 DIAGNOSIS — I10 Essential (primary) hypertension: Secondary | ICD-10-CM | POA: Diagnosis not present

## 2018-04-01 DIAGNOSIS — J9611 Chronic respiratory failure with hypoxia: Secondary | ICD-10-CM | POA: Diagnosis not present

## 2018-04-01 DIAGNOSIS — J4520 Mild intermittent asthma, uncomplicated: Secondary | ICD-10-CM | POA: Diagnosis not present

## 2018-04-01 DIAGNOSIS — S60221A Contusion of right hand, initial encounter: Secondary | ICD-10-CM | POA: Diagnosis not present

## 2018-04-05 ENCOUNTER — Encounter (HOSPITAL_COMMUNITY)
Admission: RE | Admit: 2018-04-05 | Discharge: 2018-04-05 | Disposition: A | Discharge status: Home / Self Care | Payer: Medicare HMO | Source: Ambulatory Visit | Attending: General Surgery | Admitting: General Surgery

## 2018-04-05 ENCOUNTER — Other Ambulatory Visit: Payer: Self-pay

## 2018-04-05 DIAGNOSIS — Z01812 Encounter for preprocedural laboratory examination: Secondary | ICD-10-CM | POA: Insufficient documentation

## 2018-04-05 LAB — COMPREHENSIVE METABOLIC PANEL
ALT: 72 U/L — ABNORMAL HIGH (ref 0–44)
AST: 57 U/L — ABNORMAL HIGH (ref 15–41)
Albumin: 3.6 g/dL (ref 3.5–5.0)
Alkaline Phosphatase: 98 U/L (ref 38–126)
Anion gap: 5 (ref 5–15)
BUN: 18 mg/dL (ref 8–23)
CO2: 31 mmol/L (ref 22–32)
Calcium: 9 mg/dL (ref 8.9–10.3)
Chloride: 104 mmol/L (ref 98–111)
Creatinine, Ser: 0.99 mg/dL (ref 0.44–1.00)
GFR calc Af Amer: >60 mL/min (ref >60)
GFR calc non Af Amer: 57 mL/min — ABNORMAL LOW (ref >60)
Glucose, Bld: 146 mg/dL — ABNORMAL HIGH (ref 70–99)
Potassium: 4.6 mmol/L (ref 3.5–5.1)
Sodium: 140 mmol/L (ref 135–145)
Total Bilirubin: 0.7 mg/dL (ref 0.3–1.2)
Total Protein: 6.8 g/dL (ref 6.5–8.1)

## 2018-04-05 LAB — GLUCOSE, CAPILLARY: Glucose-Capillary: 119 mg/dL — ABNORMAL HIGH (ref 70–99)

## 2018-04-05 LAB — CBC WITH DIFFERENTIAL/PLATELET
Abs Immature Granulocytes: 0.02 10*3/uL (ref 0.00–0.07)
Basophils Absolute: 0 10*3/uL (ref 0.0–0.1)
Basophils Relative: 1 %
Eosinophils Absolute: 0.1 10*3/uL (ref 0.0–0.5)
Eosinophils Relative: 2 %
HCT: 43.9 % (ref 36.0–46.0)
Hemoglobin: 12.6 g/dL (ref 12.0–15.0)
Immature Granulocytes: 0 %
Lymphocytes Relative: 18 %
Lymphs Abs: 1.3 10*3/uL (ref 0.7–4.0)
MCH: 27.9 pg (ref 26.0–34.0)
MCHC: 28.7 g/dL — ABNORMAL LOW (ref 30.0–36.0)
MCV: 97.1 fL (ref 80.0–100.0)
Monocytes Absolute: 0.5 10*3/uL (ref 0.1–1.0)
Monocytes Relative: 7 %
Neutro Abs: 5.1 10*3/uL (ref 1.7–7.7)
Neutrophils Relative %: 72 %
Platelets: 156 10*3/uL (ref 150–400)
RBC: 4.52 MIL/uL (ref 3.87–5.11)
RDW: 14.4 % (ref 11.5–15.5)
WBC: 7 10*3/uL (ref 4.0–10.5)
nRBC: 0 % (ref 0.0–0.2)

## 2018-04-05 LAB — PROTIME-INR
INR: 0.9
Prothrombin Time: 12.5 seconds (ref 11.4–15.2)

## 2018-04-05 LAB — HEMOGLOBIN A1C
Hgb A1c MFr Bld: 7 % — ABNORMAL HIGH (ref 4.8–5.6)
Mean Plasma Glucose: 154.2 mg/dL

## 2018-04-05 NOTE — Patient Instructions (Addendum)
[name]  04/05/2018   Your procedure is scheduled on: Friday 04/09/2018  Report to [facility] Main  Entrance             Report to admitting at  1130  AM    Call this number if you have problems the morning of surgery [phone]  How to Manage Your Diabetes Before and After Surgery  Why is it important to control my blood sugar before and after surgery? . Improving blood sugar levels before and after surgery helps healing and can limit problems. . A way of improving blood sugar control is eating a healthy diet by: o  Eating less sugar and carbohydrates o  Increasing activity/exercise o  Talking with your doctor about reaching your blood sugar goals . High blood sugars (greater than 180 mg/dL) can raise your risk of infections and slow your recovery, so you will need to focus on controlling your diabetes during the weeks before surgery. . Make sure that the doctor who takes care of your diabetes knows about your planned surgery including the date and location.  How do I manage my blood sugar before surgery? . Check your blood sugar at least 4 times a day, starting 2 days before surgery, to make sure that the level is not too high or low. o Check your blood sugar the morning of your surgery when you wake up and every 2 hours until you get to the Short Stay unit. . If your blood sugar is less than 70 mg/dL, you will need to treat for low blood sugar: o Do not take insulin. o Treat a low blood sugar (less than 70 mg/dL) with  cup of clear juice (cranberry or apple), 4 glucose tablets, OR glucose gel. o Recheck blood sugar in 15 minutes after treatment (to make sure it is greater than 70 mg/dL). If your blood sugar is not greater than 70 mg/dL on recheck, call [phone] for further instructions. . Report your blood sugar to the short stay nurse when you get to Short Stay.  . If you are admitted to the hospital after surgery: o Your blood sugar will  be checked by the staff and you will probably be given insulin after surgery (instead of oral diabetes medicines) to make sure you have good blood sugar levels. o The goal for blood sugar control after surgery is 80-180 mg/dL.   WHAT DO I DO ABOUT MY DIABETES MEDICATION?        The day before surgery, Please Do Not Take Saxagliptin (Ongylza) it!  . Do not take oral diabetes medicines (pills) the morning of surgery.     Remember: Do not eat food :After Midnight.  NO SOLID FOOD AFTER MIDNIGHT THE NIGHT PRIOR TO SURGERY. NOTHING BY MOUTH EXCEPT CLEAR LIQUIDS UNTIL 3 HOURS PRIOR TO [place] SURGERY. PLEASE FINISH ENSURE DRINK PER SURGEON ORDER 3 HOURS PRIOR TO SCHEDULED SURGERY TIME WHICH NEEDS TO BE COMPLETED AT 1030 am.    CLEAR LIQUID DIET   Foods Allowed  Foods Excluded  Coffee and tea, regular and decaf                             liquids that you cannot  Plain Jell-O in any flavor                                             see through such as: Fruit ices (not with fruit pulp)                                     milk, soups, orange juice  Iced Popsicles                                    All solid food Carbonated beverages, regular and diet                                    Cranberry, grape and apple juices Sports drinks like Gatorade Lightly seasoned clear broth or consume(fat free) Sugar, honey syrup  Sample Menu Breakfast                                Lunch                                     Supper Cranberry juice                    Beef broth                            Chicken broth Jell-O                                     Grape juice                           Apple juice Coffee or tea                        Jell-O                                      Popsicle                                                Coffee or tea                        Coffee or  tea  _____________________________________________________________________                 BRUSH YOUR TEETH MORNING OF SURGERY AND  RINSE YOUR MOUTH OUT, NO CHEWING GUM CANDY OR MINTS.     Take these medicines the morning of surgery with A SIP OF WATER: Bisoprolol (Zebta), Isosorbide Mononitrate (Imdur), Pantoprzole (Protonix), use eye drops, use Albuterol,inhaler if needed, use Symbicort inhaler and bring inhalers with you to the hospital, use Albuterol nebulizer if needed, use Atrovent nasal spray,                DO NOT TAKE ANY DIABETIC MEDICATIONS DAY OF YOUR SURGERY!                               You may not have any metal on your body including hair pins and              piercings  Do not wear jewelry, make-up, lotions, powders or perfumes, deodorant             Do not wear nail polish.  Do not shave  48 hours prior to surgery.               Do not bring valuables to the hospital. [place].  Contacts, dentures or bridgework may not be worn into surgery.  Leave suitcase in the car. After surgery it may be brought to your room.     Patients discharged the day of surgery will not be allowed to drive home. IF YOU ARE HAVING SURGERY AND GOING HOME THE SAME DAY, YOU MUST HAVE AN ADULT TO DRIVE YOU HOME AND BE WITH YOU FOR 24 HOURS. YOU MAY GO HOME BY TAXI OR UBER OR ORTHERWISE, BUT AN ADULT MUST ACCOMPANY YOU HOME AND STAY WITH YOU FOR 24 HOURS.  Name and phone number of your driver:spouse- [name]                Please read over the following fact sheets you were given: _____________________________________________________________________             [facility] - Preparing for Surgery Before surgery, you can play an important role.  Because skin is not sterile, your skin needs to be as free of germs as possible.  You can reduce the number of germs on your skin by washing with CHG (chlorahexidine gluconate) soap before surgery.  CHG  is an antiseptic cleaner which kills germs and bonds with the skin to continue killing germs even after washing. Please DO NOT use if you have an allergy to CHG or antibacterial soaps.  If your skin becomes reddened/irritated stop using the CHG and inform your nurse when you arrive at Short Stay. Do not shave (including legs and underarms) for at least 48 hours prior to the first CHG shower.  You may shave your face/neck. Please follow these instructions carefully:  1.  Shower with CHG Soap the night before surgery and the  morning of Surgery.  2.  If you choose to wash your hair, wash your hair first as usual with your  normal  shampoo.  3.  After you shampoo, rinse your hair and body thoroughly to remove the  shampoo.                           4.  Use CHG as you would any other liquid soap.  You can apply chg directly  to  the skin and wash                       Gently with a scrungie or clean washcloth.  5.  Apply the CHG Soap to your body ONLY FROM THE NECK DOWN.   Do not use on face/ open                           Wound or open sores. Avoid contact with eyes, ears mouth and genitals (private parts).                       Wash face,  Genitals (private parts) with your normal soap.             6.  Wash thoroughly, paying special attention to the area where your surgery  will be performed.  7.  Thoroughly rinse your body with warm water from the neck down.  8.  DO NOT shower/wash with your normal soap after using and rinsing off  the CHG Soap.                9.  Pat yourself dry with a clean towel.            10.  Wear clean pajamas.            11.  Place clean sheets on your bed the night of your first shower and do not  sleep with pets. Day of Surgery : Do not apply any lotions/deodorants the morning of surgery.  Please wear clean clothes to the hospital/surgery center.  FAILURE TO FOLLOW THESE INSTRUCTIONS MAY RESULT IN THE CANCELLATION OF YOUR SURGERY PATIENT  SIGNATURE_________________________________  NURSE SIGNATURE__________________________________  __

## 2018-04-05 NOTE — Progress Notes (Addendum)
03/05/2018- noted in Epic-Pulmonary Clearance from Dr. Melvyn Novas  02/24/2018- Cardiac Clearance from [facility], [place] for Dr. Renaee Munda in [place].  02/16/2018- noted in [place] check  11/22/2017- noted in [place]- Left Heart cath and coronary angiography  10/812/2019- noted in [place]  09/23/2017- noted in Epic-CXR

## 2018-04-07 DIAGNOSIS — R269 Unspecified abnormalities of gait and mobility: Secondary | ICD-10-CM | POA: Diagnosis not present

## 2018-04-07 DIAGNOSIS — R0602 Shortness of breath: Secondary | ICD-10-CM | POA: Diagnosis not present

## 2018-04-07 DIAGNOSIS — J9611 Chronic respiratory failure with hypoxia: Secondary | ICD-10-CM | POA: Diagnosis not present

## 2018-04-07 DIAGNOSIS — I10 Essential (primary) hypertension: Secondary | ICD-10-CM | POA: Diagnosis not present

## 2018-04-07 DIAGNOSIS — J4520 Mild intermittent asthma, uncomplicated: Secondary | ICD-10-CM | POA: Diagnosis not present

## 2018-04-07 DIAGNOSIS — I5032 Chronic diastolic (congestive) heart failure: Secondary | ICD-10-CM | POA: Diagnosis not present

## 2018-04-07 NOTE — Anesthesia Preprocedure Evaluation (Addendum)
Anesthesia Evaluation  Patient identified by MRN, date of birth, ID band Patient awake    Reviewed: Allergy & Precautions, NPO status , Patient's Chart, lab work & pertinent test results  Airway Mallampati: I  TM Distance: >3 FB Neck ROM: Full    Dental  (+) Edentulous Upper, Edentulous Lower   Pulmonary asthma , sleep apnea and Oxygen sleep apnea , former smoker,     + decreased breath sounds      Cardiovascular hypertension, + CAD, + CABG and +CHF  + dysrhythmias + pacemaker  Rhythm:Regular Rate:Normal     Neuro/Psych    GI/Hepatic Neg liver ROS, hiatal hernia, GERD  Medicated,  Endo/Other  diabetes  Renal/GU Renal disease     Musculoskeletal  (+) Arthritis ,   Abdominal (+) + obese,   Peds  Hematology   Anesthesia Other Findings   Reproductive/Obstetrics                          Anesthesia Physical Anesthesia Plan  ASA: IV  Anesthesia Plan: General   Post-op Pain Management:    Induction: Intravenous  PONV Risk Score and Plan: 4 or greater and Ondansetron, Dexamethasone and Treatment may vary due to age or medical condition  Airway Management Planned: Oral ETT  Additional Equipment: None  Intra-op Plan:   Post-operative Plan: Extubation in OR  Informed Consent: I have reviewed the patients History and Physical, chart, labs and discussed the procedure including the risks, benefits and alternatives for the proposed anesthesia with the patient or authorized representative who has indicated his/her understanding and acceptance.       Plan Discussed with: CRNA  Anesthesia Plan Comments: (See PAT note 04/05/18, Konrad Felix, PA-C  Albuterol in preop and after case. BiPAP in PACU.  Magnet in room. 100% pacer dependent. )     Anesthesia Quick Evaluation

## 2018-04-07 NOTE — Progress Notes (Signed)
Anesthesia Chart Review   Case:  [id number] Date/Time:  04/09/18 1145   Procedure:  LAPAROSCOPIC CHOLECYSTECTOMY WITH INTRAOPERATIVE CHOLANGIOGRAM (N/A )   Anesthesia type:  General   Pre-op diagnosis:  SYMPTOMATIC CHOLELITHIASIS   Location:  [place] 01 / [facility] ORS   Surgeon:  Greer Pickerel, MD      DISCUSSION: 73 yo former smoker (0.75 pack years, quit 02/11/68) with h/o asthma, CAD (s/p CABG 11/2014, DES x4 10/2016), CHF with AICD placed, CKD, OSA w/o CPAP, DM II, TIA, HTN, GERD, anemia, nonalcoholic fatty liver disease, oxygen dependent (2L O2 at night), symptomatic cholelithiasis scheduled for above procedure 04/09/18 with Dr. Greer Pickerel.   Pt last seen by cardiology 02/24/18.  Seen by Billey Chang, PA-C.  Per his note, "she is cleared from cardiac perspective to proceed with surgery.  She will need to hold her Plavix for 5 days prior to the procedure and restart as soon as possible after the procedure.  It is ideal she stay on aspirin during the surgery, however if absolutely needed to come off the aspirin, she may hold aspirin for 5 days as well.  Recent cardiac catheterization is reassuring.  I would recommend she discuss with her pulmonologist prior to surgery as she is at higher risk from her pulmonary perspective than cardiac perspective to go through with the surgery."  Pt last seen by pulmonologist, Dr. Christinia Gully, on 03/07/18.  Per his note, "There is certainly a risk of post op complications including resp failure and may well need a bridge with bipap until fully mobile but this risk is not prohibitive and the risk of not doing surgery is greater esp if then has complications from GB dz  and needs surgery anyway."  Pt can proceed with planned procedure barring acute status change.  VS: BP (!) 155/94   Pulse 65   Temp 36.9 C (Oral)   Resp 18   Ht 5\' 6"  (1.676 m)   SpO2 95%   BMI 40.19 kg/m   PROVIDERS: Darcus Austin, MD (Inactive)  Christinia Gully, MD is Pulmonologist   Croitoru, Dani Gobble,  MD is Cardiologist  LABS: Labs reviewed: Acceptable for surgery. (all labs ordered are listed, but only abnormal results are displayed)  Labs Reviewed  GLUCOSE, CAPILLARY - Abnormal; Notable for the following components:      Result Value   Glucose-Capillary 119 (*)    All other components within normal limits  CBC WITH DIFFERENTIAL/PLATELET - Abnormal; Notable for the following components:   MCHC 28.7 (*)    All other components within normal limits  COMPREHENSIVE METABOLIC PANEL - Abnormal; Notable for the following components:   Glucose, Bld 146 (*)    AST 57 (*)    ALT 72 (*)    GFR calc non Af Amer 57 (*)    All other components within normal limits  HEMOGLOBIN A1C - Abnormal; Notable for the following components:   Hgb A1c MFr Bld 7.0 (*)    All other components within normal limits  PROTIME-INR     IMAGES:   EKG: 11/21/2017 Rate 74 bpm Ventricular-paced rhythm  Biventricular pacemaker detected  Abnormal ECG  CV: Cardiac Cath 12/09/17  Prox RCA lesion is 100% stenosed.  Previously placed Ost Ramus to Ramus stent (unknown type) is widely patent.  Ost 1st Diag lesion is 60% stenosed.  Ost 2nd Diag lesion is 50% stenosed.  Mid LAD lesion is 100% stenosed.  Previously placed Ost Cx to Mid Cx stent (unknown type) is widely  patent.  Origin to Prox Graft lesion before Ramus is 100% stenosed.  The left ventricular ejection fraction is 50-55% by visual estimate.  LV end diastolic pressure is mildly elevated.  The left ventricular systolic function is normal.   RECOMMENDATION: Medical therapy for optimal blood pressure control and aggressive lipid therapy LDL target 70.  Recommend dual antiplatelet therapy with  long-term (beyond 12 months) because of coronary stents and previously documented graft disease.  Echo 10/24/15 Study Conclusions  - Left ventricle: The cavity size was normal. There was mild   concentric hypertrophy. Systolic function was  normal. The   estimated ejection fraction was in the range of 55% to 60%. Wall   motion was normal; there were no regional wall motion   abnormalities. Doppler parameters are consistent with abnormal   left ventricular relaxation (grade 1 diastolic dysfunction). - Mitral valve: Calcified annulus. Mildly thickened, mildly   calcified leaflets . - Right ventricle: Pacer wire or catheter noted in right ventricle.  Impressions:  - No cardiac source of emboli was indentified.   Past Medical History:  Diagnosis Date  . AICD (automatic cardioverter/defibrillator) present   . Anemia   . Arthritis   . Asthma   . CAD (coronary artery disease)    a. 10/09/16 LHC: SVG->LAD patent, SVG->Diag patent, SVG->RCA patent, SVG->LCx occluded. EF 60%, b. 10/31/16 LHC DES to AV groove Circ, DES to intermed branch  . Cellulitis and abscess of foot 12/2014   RT FOOT  . CHF (congestive heart failure) ([place])   . Chronic bronchitis ([place])   . Chronic lower back pain   . Diverticulosis   . Facial numbness 02/2016  . Fatty liver disease, nonalcoholic   . Gastritis   . GERD (gastroesophageal reflux disease)   . Gout   . H/O hiatal hernia   . High cholesterol   . Hyperplastic colon polyp   . Hypertension   . IBS (irritable bowel syndrome)   . Internal hemorrhoids   . Ischemic cardiomyopathy   . Obesity   . On home oxygen therapy    "2L at night" (10/31/2016)  . OSA (obstructive sleep apnea)    "can't tolerate a mask" (10/30/2016)  . Pneumonia    "couple times" (10/31/2016)  . Shortness of breath   . TIA (transient ischemic attack)    "recently" (10/31/2016)  . Type II diabetes mellitus ([place])     Past Surgical History:  Procedure Laterality Date  . ABDOMINAL ULTRASOUND  12/01/2011   Peripelvic cysts- #1- 2.4x1.9x2.3cm, #2-1.2x0.9x1.2cm  . ANKLE FRACTURE SURGERY Right    "had rod put in"  . ANTERIOR CERVICAL DECOMP/DISCECTOMY FUSION    . APPENDECTOMY    . BACK SURGERY    . BIOPSY  12/29/2017    Procedure: BIOPSY;  Surgeon: [name], MD;  [facility]: [facility];  Service: Endoscopy;;  . BIV ICD INSERTION CRT-D  2001?  [name] BIV PACEMAKER GENERATOR CHANGE OUT N/A 11/02/2012   Procedure: BIV PACEMAKER GENERATOR CHANGE OUT;  Surgeon: Sanda Klein, MD;  Location: [place] CATH LAB;  Service: Cardiovascular;  Laterality: N/A;  . CARDIAC CATHETERIZATION  05/17/1999   No significant coronary obstructive disease w/ mild 20% luminal irregularity of the first diag branch of the LAD  . CARDIAC CATHETERIZATION  07/08/2002   No significant CAD, moderately depressed LV systolic function  . CARDIAC CATHETERIZATION Bilateral 04/26/2007   Normal findings, recommend medical therapy  . CARDIAC CATHETERIZATION  02/18/2008   Moderate CAD, would benefit from having a functional study,  recommend continue medical therapy  . CARDIAC CATHETERIZATION  07/23/2012   Medical therapy  . CARDIAC CATHETERIZATION N/A 11/24/2014   Procedure: Left Heart Cath and Coronary Angiography;  Surgeon: Troy Sine, MD;  Location: [place] CV LAB;  Service: Cardiovascular;  Laterality: N/A;  . CARDIAC CATHETERIZATION  11/27/2014   Procedure: Intravascular Pressure Wire/FFR Study;  Surgeon: Peter M Martinique, MD;  Location: [place] CV LAB;  Service: Cardiovascular;;  . CARDIAC CATHETERIZATION  10/09/2016  . COLONOSCOPY WITH PROPOFOL N/A 12/29/2017   Procedure: COLONOSCOPY WITH PROPOFOL;  Surgeon: [name], MD;  [facility];  Service: Endoscopy;  Laterality: N/A;  . CORONARY ANGIOGRAM  2010  . CORONARY ARTERY BYPASS GRAFT N/A 11/29/2014   Procedure: CORONARY ARTERY BYPASS GRAFTING x 5 (LIMA-LAD, SVG-D, SVG-OM1-OM2, SVG-PD);  Surgeon: Melrose Nakayama, MD;  Location: [place];  Service: Open Heart Surgery;  Laterality: N/A;  . CORONARY STENT INTERVENTION N/A 10/31/2016   Procedure: CORONARY STENT INTERVENTION;  Surgeon: Burnell Blanks, MD;  Location: [place] CV LAB;  Service: Cardiovascular;   Laterality: N/A;  . ESOPHAGOGASTRODUODENOSCOPY (EGD) WITH PROPOFOL N/A 12/29/2017   Procedure: ESOPHAGOGASTRODUODENOSCOPY (EGD) WITH PROPOFOL;  Surgeon: [name], MD;  [facility];  Service: Endoscopy;  Laterality: N/A;  . FRACTURE SURGERY    . INSERT / REPLACE / [place]  . KNEE ARTHROSCOPY Bilateral   . LEFT HEART CATH AND CORS/GRAFTS ANGIOGRAPHY N/A 12/09/2017   Procedure: LEFT HEART CATH AND CORS/GRAFTS ANGIOGRAPHY;  Surgeon: Troy Sine, MD;  Location: [place] CV LAB;  Service: Cardiovascular;  Laterality: N/A;  . LEFT HEART CATHETERIZATION WITH CORONARY ANGIOGRAM N/A 07/23/2012   Procedure: LEFT HEART CATHETERIZATION WITH CORONARY ANGIOGRAM;  Surgeon: Leonie Man, MD;  Location: [facility] CATH LAB;  Service: Cardiovascular;  Laterality: N/A;  Carlton Adam MYOVIEW  11/14/2011   Mild-moderate defect seen in Mid Inferolateral and Mid Anterolateral regions-consistant w/ infarct/scar. No significant ischemia demonstrated.  [name] POLYPECTOMY  12/29/2017   Procedure: POLYPECTOMY;  Surgeon: [name], MD;  [facility];  Service: Endoscopy;;  . RIGHT/LEFT HEART CATH AND CORONARY ANGIOGRAPHY N/A 10/09/2016   Procedure: RIGHT/LEFT HEART CATH AND CORONARY ANGIOGRAPHY;  Surgeon: Jolaine Artist, MD;  Location: [place] CV LAB;  Service: Cardiovascular;  Laterality: N/A;  . TEE WITHOUT CARDIOVERSION N/A 11/29/2014   Procedure: TRANSESOPHAGEAL ECHOCARDIOGRAM (TEE);  Surgeon: Melrose Nakayama, MD;  Location: [place];  Service: Open Heart Surgery;  Laterality: N/A;  . TRANSTHORACIC ECHOCARDIOGRAM  07/23/2012   EF 55-60%, normal-mild  . TUBAL LIGATION      MEDICATIONS: . acetaminophen (TYLENOL) 500 MG tablet  . albuterol (PROVENTIL HFA;VENTOLIN HFA) 108 (90 Base) MCG/ACT inhaler  . albuterol (PROVENTIL) (2.5 MG/3ML) 0.083% nebulizer solution  . AMBULATORY NON FORMULARY MEDICATION  . aspirin EC 81 MG tablet  . bisoprolol (ZEBETA) 5 MG  tablet  . budesonide-formoterol (SYMBICORT) 160-4.5 MCG/ACT inhaler  . cetirizine (ZYRTEC) 10 MG tablet  . clopidogrel (PLAVIX) 75 MG tablet  . clotrimazole (LOTRIMIN) 1 % cream  . clotrimazole (MYCELEX) 10 MG troche  . colchicine 0.6 MG tablet  . diphenhydrAMINE (BENADRYL) 25 MG tablet  . docusate sodium (COLACE) 100 MG capsule  . EPINEPHrine (EPIPEN 2-PAK) 0.3 mg/0.3 mL IJ SOAJ injection  . ezetimibe (ZETIA) 10 MG tablet  . famotidine (PEPCID) 40 MG tablet  . febuxostat (ULORIC) 40 MG tablet  . fluticasone (FLONASE) 50 MCG/ACT nasal spray  . guaiFENesin (MUCINEX) 600 MG 12 hr tablet  .  hydrocortisone 2.5 % cream  . hydroxypropyl methylcellulose / hypromellose (ISOPTO TEARS / GONIOVISC) 2.5 % ophthalmic solution  . ipratropium (ATROVENT) 0.02 % nebulizer solution  . ipratropium (ATROVENT) 0.06 % nasal spray  . isosorbide mononitrate (IMDUR) 60 MG 24 hr tablet  . levofloxacin (LEVAQUIN) 500 MG tablet  . metolazone (ZAROXOLYN) 5 MG tablet  . Multiple Vitamins-Minerals (ALIVE WOMENS ENERGY PO)  . nitroGLYCERIN (NITROSTAT) 0.4 MG SL tablet  . ondansetron (ZOFRAN ODT) 4 MG disintegrating tablet  . OXYGEN  . pantoprazole (PROTONIX) 40 MG tablet  . potassium chloride SA (K-DUR,KLOR-CON) 20 MEQ tablet  . predniSONE (DELTASONE) 10 MG tablet  . RESTASIS 0.05 % ophthalmic emulsion  . rosuvastatin (CRESTOR) 20 MG tablet  . saxagliptin HCl (ONGLYZA) 2.5 MG TABS tablet  . spironolactone (ALDACTONE) 25 MG tablet  . torsemide (DEMADEX) 20 MG tablet  . triamcinolone cream (KENALOG) 0.1 %   . diphenhydrAMINE (BENADRYL) injection 50 mg     Maia Plan [facility] Pre-Surgical Testing  [phone] 04/07/18 2:39 PM

## 2018-04-08 NOTE — Progress Notes (Signed)
SPOKE WITH PATIENT BY PHONE, PATIENT AWARE SURGERY TIME CHANGE ARRIVE 1000 AM 04-09-18 [facility] ADMITTING FINISH [facility] PRESURGERY SHAKE AT 900 AM, THEN NPO

## 2018-04-09 ENCOUNTER — Encounter (HOSPITAL_COMMUNITY): Payer: Self-pay | Admitting: General Practice

## 2018-04-09 ENCOUNTER — Ambulatory Visit (HOSPITAL_COMMUNITY): Payer: Medicare HMO | Admitting: Physician Assistant

## 2018-04-09 ENCOUNTER — Observation Stay (HOSPITAL_COMMUNITY)
Admission: RE | Admit: 2018-04-09 | Discharge: 2018-04-10 | Disposition: A | Discharge status: Home / Self Care | Payer: Medicare HMO | Attending: General Surgery | Admitting: General Surgery

## 2018-04-09 ENCOUNTER — Encounter (HOSPITAL_COMMUNITY)
Admission: RE | Disposition: A | Discharge status: Home / Self Care | Payer: Self-pay | Source: Home / Self Care | Attending: General Surgery

## 2018-04-09 ENCOUNTER — Other Ambulatory Visit: Payer: Self-pay

## 2018-04-09 ENCOUNTER — Ambulatory Visit (HOSPITAL_COMMUNITY): Payer: Medicare HMO | Admitting: Registered Nurse

## 2018-04-09 DIAGNOSIS — E119 Type 2 diabetes mellitus without complications: Secondary | ICD-10-CM | POA: Insufficient documentation

## 2018-04-09 DIAGNOSIS — I2510 Atherosclerotic heart disease of native coronary artery without angina pectoris: Secondary | ICD-10-CM | POA: Insufficient documentation

## 2018-04-09 DIAGNOSIS — Z7982 Long term (current) use of aspirin: Secondary | ICD-10-CM | POA: Diagnosis not present

## 2018-04-09 DIAGNOSIS — I5033 Acute on chronic diastolic (congestive) heart failure: Secondary | ICD-10-CM | POA: Diagnosis not present

## 2018-04-09 DIAGNOSIS — Z6841 Body Mass Index (BMI) 40.0 and over, adult: Secondary | ICD-10-CM | POA: Diagnosis not present

## 2018-04-09 DIAGNOSIS — G4733 Obstructive sleep apnea (adult) (pediatric): Secondary | ICD-10-CM | POA: Diagnosis not present

## 2018-04-09 DIAGNOSIS — Z7951 Long term (current) use of inhaled steroids: Secondary | ICD-10-CM | POA: Diagnosis not present

## 2018-04-09 DIAGNOSIS — Z951 Presence of aortocoronary bypass graft: Secondary | ICD-10-CM | POA: Diagnosis not present

## 2018-04-09 DIAGNOSIS — I110 Hypertensive heart disease with heart failure: Secondary | ICD-10-CM | POA: Diagnosis not present

## 2018-04-09 DIAGNOSIS — Z9981 Dependence on supplemental oxygen: Secondary | ICD-10-CM | POA: Diagnosis not present

## 2018-04-09 DIAGNOSIS — K649 Unspecified hemorrhoids: Secondary | ICD-10-CM | POA: Insufficient documentation

## 2018-04-09 DIAGNOSIS — Z87891 Personal history of nicotine dependence: Secondary | ICD-10-CM | POA: Insufficient documentation

## 2018-04-09 DIAGNOSIS — Z888 Allergy status to other drugs, medicaments and biological substances status: Secondary | ICD-10-CM | POA: Insufficient documentation

## 2018-04-09 DIAGNOSIS — Z881 Allergy status to other antibiotic agents status: Secondary | ICD-10-CM | POA: Diagnosis not present

## 2018-04-09 DIAGNOSIS — K760 Fatty (change of) liver, not elsewhere classified: Secondary | ICD-10-CM | POA: Diagnosis not present

## 2018-04-09 DIAGNOSIS — Z79899 Other long term (current) drug therapy: Secondary | ICD-10-CM | POA: Insufficient documentation

## 2018-04-09 DIAGNOSIS — Z882 Allergy status to sulfonamides status: Secondary | ICD-10-CM | POA: Insufficient documentation

## 2018-04-09 DIAGNOSIS — Z8249 Family history of ischemic heart disease and other diseases of the circulatory system: Secondary | ICD-10-CM | POA: Diagnosis not present

## 2018-04-09 DIAGNOSIS — K219 Gastro-esophageal reflux disease without esophagitis: Secondary | ICD-10-CM | POA: Insufficient documentation

## 2018-04-09 DIAGNOSIS — K8010 Calculus of gallbladder with chronic cholecystitis without obstruction: Principal | ICD-10-CM | POA: Insufficient documentation

## 2018-04-09 DIAGNOSIS — I509 Heart failure, unspecified: Secondary | ICD-10-CM | POA: Diagnosis not present

## 2018-04-09 DIAGNOSIS — Z7902 Long term (current) use of antithrombotics/antiplatelets: Secondary | ICD-10-CM | POA: Insufficient documentation

## 2018-04-09 DIAGNOSIS — Z9049 Acquired absence of other specified parts of digestive tract: Secondary | ICD-10-CM

## 2018-04-09 HISTORY — PX: CHOLECYSTECTOMY: SHX55

## 2018-04-09 LAB — GLUCOSE, CAPILLARY
Glucose-Capillary: 260 mg/dL — ABNORMAL HIGH (ref 70–99)
Glucose-Capillary: 115 mg/dL — ABNORMAL HIGH (ref 70–99)
Glucose-Capillary: 118 mg/dL — ABNORMAL HIGH (ref 70–99)

## 2018-04-09 SURGERY — LAPAROSCOPIC CHOLECYSTECTOMY WITH INTRAOPERATIVE CHOLANGIOGRAM
Anesthesia: General | Site: Abdomen

## 2018-04-09 MED ORDER — ACETAMINOPHEN 325 MG PO TABS
650.0000 mg | ORAL_TABLET | Freq: Four times a day (QID) | ORAL | Status: DC
  Administered 2018-04-10 (×3): 650 mg via ORAL
  Filled 2018-04-09 (×3): qty 2

## 2018-04-09 MED ORDER — LINAGLIPTIN 5 MG PO TABS
5.0000 mg | ORAL_TABLET | Freq: Every day | ORAL | Status: DC
  Administered 2018-04-09 – 2018-04-10 (×2): 5 mg via ORAL
  Filled 2018-04-09 (×2): qty 1

## 2018-04-09 MED ORDER — ROCURONIUM BROMIDE 10 MG/ML (PF) SYRINGE
PREFILLED_SYRINGE | INTRAVENOUS | Status: DC | PRN
  Administered 2018-04-09: 20 mg via INTRAVENOUS
  Administered 2018-04-09: 50 mg via INTRAVENOUS

## 2018-04-09 MED ORDER — DEXAMETHASONE SODIUM PHOSPHATE 10 MG/ML IJ SOLN
INTRAMUSCULAR | Status: AC
  Filled 2018-04-09: qty 1

## 2018-04-09 MED ORDER — SUGAMMADEX SODIUM 500 MG/5ML IV SOLN
INTRAVENOUS | Status: DC | PRN
  Administered 2018-04-09: 500 mg via INTRAVENOUS

## 2018-04-09 MED ORDER — ONDANSETRON HCL 4 MG/2ML IJ SOLN: 4.0000 mg | Freq: Four times a day (QID) | INTRAMUSCULAR | Status: DC | PRN

## 2018-04-09 MED ORDER — CHLORHEXIDINE GLUCONATE CLOTH 2 % EX PADS: 6.0000 | MEDICATED_PAD | Freq: Once | CUTANEOUS | Status: DC

## 2018-04-09 MED ORDER — INSULIN ASPART 100 UNIT/ML ~~LOC~~ SOLN
0.0000 [IU] | Freq: Three times a day (TID) | SUBCUTANEOUS | Status: DC
  Administered 2018-04-10: 3 [IU] via SUBCUTANEOUS
  Administered 2018-04-10: 2 [IU] via SUBCUTANEOUS

## 2018-04-09 MED ORDER — MOMETASONE FURO-FORMOTEROL FUM 200-5 MCG/ACT IN AERO
2.0000 | INHALATION_SPRAY | Freq: Two times a day (BID) | RESPIRATORY_TRACT | Status: DC
  Administered 2018-04-09: 2 via RESPIRATORY_TRACT
  Filled 2018-04-09: qty 8.8

## 2018-04-09 MED ORDER — BUPIVACAINE HCL (PF) 0.25 % IJ SOLN
INTRAMUSCULAR | Status: DC | PRN
  Administered 2018-04-09: 30 mL

## 2018-04-09 MED ORDER — DIPHENHYDRAMINE HCL 25 MG PO CAPS: 25.0000 mg | ORAL_CAPSULE | Freq: Four times a day (QID) | ORAL | Status: DC | PRN

## 2018-04-09 MED ORDER — GABAPENTIN 100 MG PO CAPS
200.0000 mg | ORAL_CAPSULE | Freq: Two times a day (BID) | ORAL | Status: DC
  Administered 2018-04-09 – 2018-04-10 (×2): 200 mg via ORAL
  Filled 2018-04-09 (×2): qty 2

## 2018-04-09 MED ORDER — LIDOCAINE 2% (20 MG/ML) 5 ML SYRINGE
INTRAMUSCULAR | Status: DC | PRN
  Administered 2018-04-09: 80 mg via INTRAVENOUS

## 2018-04-09 MED ORDER — BISOPROLOL FUMARATE 5 MG PO TABS
5.0000 mg | ORAL_TABLET | Freq: Every day | ORAL | Status: DC
  Administered 2018-04-10: 5 mg via ORAL
  Filled 2018-04-09: qty 1

## 2018-04-09 MED ORDER — ACETAMINOPHEN 10 MG/ML IV SOLN
1000.0000 mg | Freq: Once | INTRAVENOUS | Status: DC | PRN
  Administered 2018-04-09: 1000 mg via INTRAVENOUS

## 2018-04-09 MED ORDER — ONDANSETRON HCL 4 MG/2ML IJ SOLN
INTRAMUSCULAR | Status: DC | PRN
  Administered 2018-04-09: 4 mg via INTRAVENOUS

## 2018-04-09 MED ORDER — ACETAMINOPHEN 10 MG/ML IV SOLN
INTRAVENOUS | Status: AC
  Filled 2018-04-09: qty 100

## 2018-04-09 MED ORDER — HYDROMORPHONE HCL 1 MG/ML IJ SOLN
1.0000 mg | INTRAMUSCULAR | Status: DC | PRN
  Administered 2018-04-09: 1 mg via INTRAVENOUS
  Filled 2018-04-09: qty 1

## 2018-04-09 MED ORDER — ALBUTEROL SULFATE HFA 108 (90 BASE) MCG/ACT IN AERS: 2.0000 | INHALATION_SPRAY | Freq: Four times a day (QID) | RESPIRATORY_TRACT | Status: DC | PRN

## 2018-04-09 MED ORDER — POTASSIUM CHLORIDE CRYS ER 20 MEQ PO TBCR
20.0000 meq | EXTENDED_RELEASE_TABLET | Freq: Every day | ORAL | Status: DC
  Administered 2018-04-10: 20 meq via ORAL
  Filled 2018-04-09: qty 1

## 2018-04-09 MED ORDER — CYCLOSPORINE 0.05 % OP EMUL
1.0000 [drp] | Freq: Two times a day (BID) | OPHTHALMIC | Status: DC
  Administered 2018-04-09 – 2018-04-10 (×2): 1 [drp] via OPHTHALMIC
  Filled 2018-04-09 (×2): qty 30

## 2018-04-09 MED ORDER — ONDANSETRON 4 MG PO TBDP: 4.0000 mg | ORAL_TABLET | Freq: Four times a day (QID) | ORAL | Status: DC | PRN

## 2018-04-09 MED ORDER — COLCHICINE 0.6 MG PO TABS
0.6000 mg | ORAL_TABLET | Freq: Every day | ORAL | Status: DC
  Administered 2018-04-09 – 2018-04-10 (×2): 0.6 mg via ORAL
  Filled 2018-04-09 (×2): qty 1

## 2018-04-09 MED ORDER — ROCURONIUM BROMIDE 100 MG/10ML IV SOLN
INTRAVENOUS | Status: AC
  Filled 2018-04-09: qty 1

## 2018-04-09 MED ORDER — CIPROFLOXACIN IN D5W 400 MG/200ML IV SOLN
400.0000 mg | INTRAVENOUS | Status: AC
  Administered 2018-04-09: 400 mg via INTRAVENOUS
  Filled 2018-04-09: qty 200

## 2018-04-09 MED ORDER — LIDOCAINE 2% (20 MG/ML) 5 ML SYRINGE
INTRAMUSCULAR | Status: AC
  Filled 2018-04-09: qty 5

## 2018-04-09 MED ORDER — PANTOPRAZOLE SODIUM 40 MG PO TBEC
40.0000 mg | DELAYED_RELEASE_TABLET | Freq: Every day | ORAL | Status: DC
  Administered 2018-04-10: 40 mg via ORAL
  Filled 2018-04-09: qty 1

## 2018-04-09 MED ORDER — ACETAMINOPHEN 325 MG PO TABS: 325.0000 mg | ORAL_TABLET | Freq: Once | ORAL | Status: DC

## 2018-04-09 MED ORDER — PHENYLEPHRINE 40 MCG/ML (10ML) SYRINGE FOR IV PUSH (FOR BLOOD PRESSURE SUPPORT)
PREFILLED_SYRINGE | INTRAVENOUS | Status: DC | PRN
  Administered 2018-04-09: 80 ug via INTRAVENOUS
  Administered 2018-04-09: 120 ug via INTRAVENOUS
  Administered 2018-04-09 (×4): 80 ug via INTRAVENOUS
  Administered 2018-04-09: 120 ug via INTRAVENOUS

## 2018-04-09 MED ORDER — EPHEDRINE SULFATE-NACL 50-0.9 MG/10ML-% IV SOSY
PREFILLED_SYRINGE | INTRAVENOUS | Status: DC | PRN
  Administered 2018-04-09 (×2): 10 mg via INTRAVENOUS

## 2018-04-09 MED ORDER — TORSEMIDE 20 MG PO TABS
40.0000 mg | ORAL_TABLET | Freq: Every day | ORAL | Status: DC
  Administered 2018-04-10: 40 mg via ORAL
  Filled 2018-04-09: qty 2

## 2018-04-09 MED ORDER — PHENYLEPHRINE 40 MCG/ML (10ML) SYRINGE FOR IV PUSH (FOR BLOOD PRESSURE SUPPORT)
PREFILLED_SYRINGE | INTRAVENOUS | Status: AC
  Filled 2018-04-09: qty 10

## 2018-04-09 MED ORDER — FENTANYL CITRATE (PF) 100 MCG/2ML IJ SOLN
25.0000 ug | INTRAMUSCULAR | Status: DC | PRN
  Administered 2018-04-09 (×3): 25 ug via INTRAVENOUS

## 2018-04-09 MED ORDER — PROMETHAZINE HCL 25 MG/ML IJ SOLN: 6.2500 mg | INTRAMUSCULAR | Status: DC | PRN

## 2018-04-09 MED ORDER — FEBUXOSTAT 40 MG PO TABS
40.0000 mg | ORAL_TABLET | Freq: Every day | ORAL | Status: DC
  Administered 2018-04-09 – 2018-04-10 (×2): 40 mg via ORAL
  Filled 2018-04-09 (×2): qty 1

## 2018-04-09 MED ORDER — SPIRONOLACTONE 25 MG PO TABS
25.0000 mg | ORAL_TABLET | Freq: Every day | ORAL | Status: DC
  Administered 2018-04-09: 25 mg via ORAL
  Filled 2018-04-09: qty 1

## 2018-04-09 MED ORDER — GUAIFENESIN ER 600 MG PO TB12
600.0000 mg | ORAL_TABLET | Freq: Every evening | ORAL | Status: DC | PRN
  Administered 2018-04-09: 600 mg via ORAL
  Filled 2018-04-09 (×2): qty 1

## 2018-04-09 MED ORDER — LACTATED RINGERS IV SOLN
INTRAVENOUS | Status: DC
  Administered 2018-04-09: 11:00:00 via INTRAVENOUS

## 2018-04-09 MED ORDER — LACTATED RINGERS IV SOLN
INTRAVENOUS | Status: DC
  Administered 2018-04-09: 1000 mL via INTRAVENOUS

## 2018-04-09 MED ORDER — FENTANYL CITRATE (PF) 250 MCG/5ML IJ SOLN
INTRAMUSCULAR | Status: AC
  Filled 2018-04-09: qty 5

## 2018-04-09 MED ORDER — NITROGLYCERIN 0.4 MG SL SUBL: 0.4000 mg | SUBLINGUAL_TABLET | SUBLINGUAL | Status: DC | PRN

## 2018-04-09 MED ORDER — CLOTRIMAZOLE 10 MG MT TROC
10.0000 mg | Freq: Every day | OROMUCOSAL | Status: DC | PRN
  Filled 2018-04-09: qty 1

## 2018-04-09 MED ORDER — EPHEDRINE 5 MG/ML INJ
INTRAVENOUS | Status: AC
  Filled 2018-04-09: qty 10

## 2018-04-09 MED ORDER — ACETAMINOPHEN 160 MG/5ML PO SOLN: 325.0000 mg | Freq: Once | ORAL | Status: DC

## 2018-04-09 MED ORDER — ENOXAPARIN SODIUM 40 MG/0.4ML ~~LOC~~ SOLN
40.0000 mg | SUBCUTANEOUS | Status: DC
  Administered 2018-04-09: 40 mg via SUBCUTANEOUS
  Filled 2018-04-09: qty 0.4

## 2018-04-09 MED ORDER — GABAPENTIN 300 MG PO CAPS
300.0000 mg | ORAL_CAPSULE | ORAL | Status: AC
  Administered 2018-04-09: 300 mg via ORAL
  Filled 2018-04-09: qty 1

## 2018-04-09 MED ORDER — 0.9 % SODIUM CHLORIDE (POUR BTL) OPTIME
TOPICAL | Status: DC | PRN
  Administered 2018-04-09: 1000 mL

## 2018-04-09 MED ORDER — ACETAMINOPHEN 500 MG PO TABS
1000.0000 mg | ORAL_TABLET | ORAL | Status: AC
  Administered 2018-04-09: 1000 mg via ORAL
  Filled 2018-04-09: qty 2

## 2018-04-09 MED ORDER — TORSEMIDE 20 MG PO TABS
20.0000 mg | ORAL_TABLET | Freq: Every evening | ORAL | Status: DC
  Administered 2018-04-09: 20 mg via ORAL
  Filled 2018-04-09 (×2): qty 1

## 2018-04-09 MED ORDER — PROPOFOL 10 MG/ML IV BOLUS
INTRAVENOUS | Status: DC | PRN
  Administered 2018-04-09: 130 mg via INTRAVENOUS

## 2018-04-09 MED ORDER — ONDANSETRON HCL 4 MG/2ML IJ SOLN
INTRAMUSCULAR | Status: AC
  Filled 2018-04-09: qty 2

## 2018-04-09 MED ORDER — FLUTICASONE PROPIONATE 50 MCG/ACT NA SUSP
1.0000 | Freq: Every evening | NASAL | Status: DC | PRN
  Filled 2018-04-09: qty 16

## 2018-04-09 MED ORDER — MEPERIDINE HCL 50 MG/ML IJ SOLN: 6.2500 mg | INTRAMUSCULAR | Status: DC | PRN

## 2018-04-09 MED ORDER — TRAMADOL HCL 50 MG PO TABS
50.0000 mg | ORAL_TABLET | Freq: Four times a day (QID) | ORAL | Status: DC | PRN
  Administered 2018-04-09: 50 mg via ORAL
  Filled 2018-04-09: qty 1

## 2018-04-09 MED ORDER — PROPOFOL 10 MG/ML IV BOLUS
INTRAVENOUS | Status: AC
  Filled 2018-04-09: qty 20

## 2018-04-09 MED ORDER — TORSEMIDE 20 MG PO TABS: 20.0000 mg | ORAL_TABLET | ORAL | Status: DC

## 2018-04-09 MED ORDER — ISOSORBIDE MONONITRATE ER 60 MG PO TB24
60.0000 mg | ORAL_TABLET | Freq: Every day | ORAL | Status: DC
  Administered 2018-04-10: 60 mg via ORAL
  Filled 2018-04-09: qty 1

## 2018-04-09 MED ORDER — ALBUTEROL SULFATE (2.5 MG/3ML) 0.083% IN NEBU: 2.5000 mg | INHALATION_SOLUTION | Freq: Four times a day (QID) | RESPIRATORY_TRACT | Status: DC | PRN

## 2018-04-09 MED ORDER — SUGAMMADEX SODIUM 500 MG/5ML IV SOLN
INTRAVENOUS | Status: AC
  Filled 2018-04-09: qty 5

## 2018-04-09 MED ORDER — BUPIVACAINE HCL (PF) 0.25 % IJ SOLN
INTRAMUSCULAR | Status: AC
  Filled 2018-04-09: qty 30

## 2018-04-09 MED ORDER — DOCUSATE SODIUM 100 MG PO CAPS
100.0000 mg | ORAL_CAPSULE | Freq: Two times a day (BID) | ORAL | Status: DC
  Administered 2018-04-09 – 2018-04-10 (×2): 100 mg via ORAL
  Filled 2018-04-09 (×2): qty 1

## 2018-04-09 MED ORDER — FENTANYL CITRATE (PF) 100 MCG/2ML IJ SOLN
INTRAMUSCULAR | Status: AC
  Filled 2018-04-09: qty 2

## 2018-04-09 MED ORDER — POLYVINYL ALCOHOL 1.4 % OP SOLN
1.0000 [drp] | Freq: Three times a day (TID) | OPHTHALMIC | Status: DC
  Administered 2018-04-09 – 2018-04-10 (×2): 1 [drp] via OPHTHALMIC
  Filled 2018-04-09: qty 15

## 2018-04-09 MED ORDER — LACTATED RINGERS IR SOLN
Status: DC | PRN
  Administered 2018-04-09: 1000 mL

## 2018-04-09 MED ORDER — FENTANYL CITRATE (PF) 250 MCG/5ML IJ SOLN
INTRAMUSCULAR | Status: DC | PRN
  Administered 2018-04-09: 100 ug via INTRAVENOUS

## 2018-04-09 SURGICAL SUPPLY — 53 items
APPLICATOR ARISTA FLEXITIP XL (MISCELLANEOUS) IMPLANT
APPLIER CLIP 5 13 M/L LIGAMAX5 (MISCELLANEOUS)
APPLIER CLIP ROT 10 11.4 M/L (STAPLE)
BANDAGE ADH SHEER 1  50/CT (GAUZE/BANDAGES/DRESSINGS) ×8 IMPLANT
BENZOIN TINCTURE PRP APPL 2/3 (GAUZE/BANDAGES/DRESSINGS) ×2 IMPLANT
CABLE HIGH FREQUENCY MONO STRZ (ELECTRODE) ×2 IMPLANT
CHLORAPREP W/TINT 26ML (MISCELLANEOUS) ×2 IMPLANT
CLIP APPLIE 5 13 M/L LIGAMAX5 (MISCELLANEOUS) IMPLANT
CLIP APPLIE ROT 10 11.4 M/L (STAPLE) IMPLANT
CLIP VESOLOCK MED LG 6/CT (CLIP) ×2 IMPLANT
COVER MAYO STAND STRL (DRAPES) IMPLANT
COVER SURGICAL LIGHT HANDLE (MISCELLANEOUS) ×2 IMPLANT
COVER WAND RF STERILE (DRAPES) ×2 IMPLANT
DECANTER SPIKE VIAL GLASS SM (MISCELLANEOUS) ×2 IMPLANT
DRAPE C-ARM 42X120 X-RAY (DRAPES) IMPLANT
ELECT PENCIL ROCKER SW 15FT (MISCELLANEOUS) ×2 IMPLANT
ELECT REM PT RETURN 15FT ADLT (MISCELLANEOUS) ×2 IMPLANT
GAUZE SPONGE 2X2 8PLY STRL LF (GAUZE/BANDAGES/DRESSINGS) IMPLANT
GLOVE BIO SURGEON STRL SZ7.5 (GLOVE) ×2 IMPLANT
GLOVE BIOGEL PI IND STRL 6.5 (GLOVE) ×1 IMPLANT
GLOVE BIOGEL PI IND STRL 7.0 (GLOVE) ×1 IMPLANT
GLOVE BIOGEL PI INDICATOR 6.5 (GLOVE) ×1
GLOVE BIOGEL PI INDICATOR 7.0 (GLOVE) ×1
GLOVE ECLIPSE 7.0 STRL STRAW (GLOVE) ×2 IMPLANT
GLOVE INDICATOR 8.0 STRL GRN (GLOVE) ×2 IMPLANT
GLOVE SURG SS PI 6.0 STRL IVOR (GLOVE) ×2 IMPLANT
GLOVE SURG SS PI 7.0 STRL IVOR (GLOVE) ×2 IMPLANT
GOWN STRL REUS W/TWL XL LVL3 (GOWN DISPOSABLE) ×6 IMPLANT
GRASPER SUT TROCAR 14GX15 (MISCELLANEOUS) ×2 IMPLANT
HEMOSTAT ARISTA ABSORB 3G PWDR (HEMOSTASIS) IMPLANT
HEMOSTAT SNOW SURGICEL 2X4 (HEMOSTASIS) ×2 IMPLANT
KIT BASIN OR (CUSTOM PROCEDURE TRAY) ×2 IMPLANT
L-HOOK LAP DISP 36CM (ELECTROSURGICAL) ×2
LHOOK LAP DISP 36CM (ELECTROSURGICAL) ×1 IMPLANT
POUCH RETRIEVAL ECOSAC 10 (ENDOMECHANICALS) ×1 IMPLANT
POUCH RETRIEVAL ECOSAC 10MM (ENDOMECHANICALS) ×1
SCISSORS LAP 5X35 DISP (ENDOMECHANICALS) ×2 IMPLANT
SET CHOLANGIOGRAPH MIX (MISCELLANEOUS) IMPLANT
SET IRRIG TUBING LAPAROSCOPIC (IRRIGATION / IRRIGATOR) ×2 IMPLANT
SET TUBE SMOKE EVAC HIGH FLOW (TUBING) ×2 IMPLANT
SLEEVE XCEL OPT CAN 5 100 (ENDOMECHANICALS) ×4 IMPLANT
SPONGE GAUZE 2X2 STER 10/PKG (GAUZE/BANDAGES/DRESSINGS)
STRIP CLOSURE SKIN 1/2X4 (GAUZE/BANDAGES/DRESSINGS) ×2 IMPLANT
SUT MNCRL AB 4-0 PS2 18 (SUTURE) ×2 IMPLANT
SUT VIC AB 0 UR5 27 (SUTURE) IMPLANT
SUT VICRYL 0 TIES 12 18 (SUTURE) IMPLANT
SUT VICRYL 0 UR6 27IN ABS (SUTURE) ×2 IMPLANT
TOWEL OR 17X26 10 PK STRL BLUE (TOWEL DISPOSABLE) ×2 IMPLANT
TOWEL OR NON WOVEN STRL DISP B (DISPOSABLE) ×2 IMPLANT
TRAY LAPAROSCOPIC (CUSTOM PROCEDURE TRAY) ×2 IMPLANT
TROCAR BLADELESS OPT 5 100 (ENDOMECHANICALS) ×2 IMPLANT
TROCAR XCEL BLUNT TIP 100MML (ENDOMECHANICALS) IMPLANT
TROCAR XCEL NON-BLD 11X100MML (ENDOMECHANICALS) ×2 IMPLANT

## 2018-04-09 NOTE — Anesthesia Procedure Notes (Signed)
Procedure Name: Intubation Date/Time: 04/09/2018 12:10 PM Performed by: Talbot Grumbling, CRNA Pre-anesthesia Checklist: Patient identified, Emergency Drugs available, Suction available and Patient being monitored Patient Re-evaluated:Patient Re-evaluated prior to induction Oxygen Delivery Method: Circle system utilized Preoxygenation: Pre-oxygenation with 100% oxygen Induction Type: IV induction Ventilation: Mask ventilation without difficulty Laryngoscope Size: Mac and 3 Grade View: Grade I Tube type: Oral Tube size: 7.5 mm Number of attempts: 1 Airway Equipment and Method: Stylet Placement Confirmation: ETT inserted through vocal cords under direct vision,  positive ETCO2 and breath sounds checked- equal and bilateral Secured at: 22 cm Tube secured with: Tape Dental Injury: Teeth and Oropharynx as per pre-operative assessment

## 2018-04-09 NOTE — Transfer of Care (Signed)
Immediate Anesthesia Transfer of Care Note  Patient: [name]  Procedure(s) Performed: LAPAROSCOPIC CHOLECYSTECTOMY (N/A Abdomen)  Patient Location: PACU  Anesthesia Type:General  Level of Consciousness: awake, alert  and oriented  Airway & Oxygen Therapy: Patient Spontanous Breathing and Patient connected to face mask oxygen  Post-op Assessment: Report given to RN and Post -op Vital signs reviewed and stable  Post vital signs: Reviewed and stable  Last Vitals:  Vitals Value Taken Time  BP 106/69 04/09/2018  2:02 PM  Temp    Pulse 66 04/09/2018  2:03 PM  Resp 13 04/09/2018  2:03 PM  SpO2 92 % 04/09/2018  2:03 PM  Vitals shown include unvalidated device data.  Last Pain:  Vitals:   04/09/18 1014  TempSrc: Oral         Complications: No apparent anesthesia complications

## 2018-04-09 NOTE — Op Note (Signed)
[name] [id number] [birth year] 04/09/2018  Laparoscopic Cholecystectomy Procedure Note  Indications: This patient presents with symptomatic gallbladder disease and will undergo laparoscopic cholecystectomy.  Pre-operative Diagnosis: Symptomatic cholelithiasis  Post-operative Diagnosis: same + Fatty liver  Surgeon: Greer Pickerel  MD FACS  Assistants: none  Anesthesia: General endotracheal anesthesia  Procedure Details  The patient was seen again in the Holding Room. The risks, benefits, complications, treatment options, and expected outcomes were discussed with the patient. The possibilities of reaction to medication, pulmonary aspiration, perforation of viscus, bleeding, recurrent infection, finding a normal gallbladder, the need for additional procedures, failure to diagnose a condition, the possible need to convert to an open procedure, and creating a complication requiring transfusion or operation were discussed with the patient. The likelihood of improving the patient's symptoms with return to their baseline status is good.  The patient and/or family concurred with the proposed plan, giving informed consent. The site of surgery properly noted. The patient was taken to Operating Room, identified as [name] and the procedure verified as Laparoscopic Cholecystectomy. A Time Out was held and the above information confirmed. Antibiotic prophylaxis was administered.   Prior to the induction of general anesthesia, antibiotic prophylaxis was administered. General endotracheal anesthesia was then administered and tolerated well. After the induction, the abdomen was prepped with Chloraprep and draped in the sterile fashion. The patient was positioned in the supine position.  Because of her central truncal obesity I elected to gain access to the abdomen using the Optiview technique.  A small incision was made near the left subcostal margin in the left upper quadrant in the midclavicular  line.  Then using a 0 degree 5 mm laparoscope through a 5 mm trocar I advanced it through all layers of the abdominal wall to I was in the abdominal cavity.  Initial intra-abdominal pressure was high and it was felt that I was a little bit too far into the abdominal cavity so I pulled the trocar back and I was then anterior to the omentum and pneumoperitoneum was smoothly established up to a patient pressure of 15 mmHg.  The abdominal cavity was surveilled.  There did not appear to be any injury to surrounding structures however I had initially insufflated below the level of the omentum near the transverse colon.  I placed a 12 mm trocar in the supraumbilical position under direct visualization and two 5 mm trochars in the right abdomen all under direct visualization after local been infiltrated.  There was a tongue of omentum stuck to the lower midline.  I decided to inspect the left upper quadrant where had gained access with the Optiview.  In order to do so I needed to release the omental attachments from the lower midline.  This was done with hook electrocautery.  I was then able to swing the omentum up above the level of the transverse colon identified the transverse colon.  There had been some evidence of insufflation near the transverse colon but there was no evidence of colotomy. I was able to visualize about 15 cm of the transverse colon near where the LUQ trocar had been placed and there was no evidence of colostomy. I lifted up the transverse colon and identified ligament of Treitz and ran the first 15 cm of small bowel and there is no evidence of injury to the small bowel.  I then placed the omentum back in its normal position and identified the edge of the stomach.  I took down some omentum this  location and got into the lesser sac without having to take down any of the short gastrics.  I was able to visualize along the greater curvature of the stomach posteriorly.  There is no evidence of visceral  injury in the lesser sac.  I then decided to proceed with the procedure  The entire liver was above the abdominal rib cage. We positioned the patient in reverse Trendelenburg, tilted slightly to the patient's left.  The gallbladder was identified, the fundus grasped and retracted cephalad. Adhesions were lysed bluntly and with the electrocautery where indicated, taking care not to injure any adjacent organs or viscus.  She had a fair amount of fat tissue around the gallbladder which was taken down with hook electrocautery.  The patient also had a large amount of visceral adipose tissue however I was able to visualize the gallbladder.  The infundibulum was grasped and retracted laterally, exposing the peritoneum overlying the triangle of Calot. This was then divided and exposed in a blunt fashion. A critical view of the cystic duct and cystic artery was obtained.    The cystic duct was then ligated with 3 hemo-lock clips on the biliary side and divided. The cystic artery which had been identified & dissected free was ligated with 2 hemo-lock clips and divided as well.   The gallbladder was dissected from the liver bed in retrograde fashion with the electrocautery.  The patient had a fatty liver.  There was some bleeding from the liver surface from where the gallbladder had been attached to it.  Hemostasis was achieved with electrocautery.  The gallbladder was removed and placed in an Ecco sac.  The gallbladder and Ecco sac were then removed through the umbilical port site. The liver bed was irrigated and inspected. Hemostasis was achieved with the electrocautery. Copious irrigation was utilized and was repeatedly aspirated until clear.  I placed a piece of surgical snow in the gallbladder fossa  I then closed the umbilical fascia with 4 interrupted 0 Vicryl sutures using the PMI suture passer with laparoscopic assistance.  We again inspected the right upper quadrant for hemostasis.  The umbilical closure  was inspected and there was no air leak and nothing trapped within the closure.  She had very attenuated and thin abdominal fascia.  Pneumoperitoneum was released as we removed the trocars.  4-0 Monocryl was used to close the skin.   Benzoin, steri-strips, and clean dressings were applied. The patient was then extubated and brought to the recovery room in stable condition. Instrument, sponge, and needle counts were correct at closure and at the conclusion of the case.   Findings: Chronic Cholecystitis with Cholelithiasis +SNOW +critical view +fatty liver  Estimated Blood Loss: 25cc         Drains: none         Specimens: Gallbladder           Complications: None; patient tolerated the procedure well.         Disposition: PACU - hemodynamically stable.         Condition: stable  Leighton Ruff. Redmond Pulling, MD, FACS General, Bariatric, & Minimally Invasive Surgery [facility], [place]

## 2018-04-09 NOTE — Interval H&P Note (Signed)
History and Physical Interval Note:  04/09/2018 11:32 AM  [name]  has presented today for surgery, with the diagnosis of SYMPTOMATIC CHOLELITHIASIS  The various methods of treatment have been discussed with the patient and family. After consideration of risks, benefits and other options for treatment, the patient has consented to  Procedure(s): LAPAROSCOPIC CHOLECYSTECTOMY WITH INTRAOPERATIVE CHOLANGIOGRAM (N/A) as a surgical intervention .  The patient's history has been reviewed, patient examined, no change in status, stable for surgery.  I have reviewed the patient's chart and labs.  Questions were answered to the patient's satisfaction.     Greer Pickerel

## 2018-04-09 NOTE — Progress Notes (Signed)
Pt. Refuses to wear CPAP device stating that she does not wear at home and can't tolerate things on her face.  Will be available if she changes her mind.

## 2018-04-09 NOTE — Interval H&P Note (Signed)
History and Physical Interval Note:  04/09/2018 11:18 AM  [name]  has presented today for surgery, with the diagnosis of SYMPTOMATIC CHOLELITHIASIS  The various methods of treatment have been discussed with the patient and family. After consideration of risks, benefits and other options for treatment, the patient has consented to  Procedure(s): LAPAROSCOPIC CHOLECYSTECTOMY WITH INTRAOPERATIVE CHOLANGIOGRAM (N/A) as a surgical intervention .  The patient's history has been reviewed, patient examined, no change in status, stable for surgery.  I have reviewed the patient's chart and labs.  Questions were answered to the patient's satisfaction.     Greer Pickerel

## 2018-04-10 DIAGNOSIS — K649 Unspecified hemorrhoids: Secondary | ICD-10-CM | POA: Diagnosis not present

## 2018-04-10 DIAGNOSIS — Z6841 Body Mass Index (BMI) 40.0 and over, adult: Secondary | ICD-10-CM | POA: Diagnosis not present

## 2018-04-10 DIAGNOSIS — E119 Type 2 diabetes mellitus without complications: Secondary | ICD-10-CM | POA: Diagnosis not present

## 2018-04-10 DIAGNOSIS — K760 Fatty (change of) liver, not elsewhere classified: Secondary | ICD-10-CM | POA: Diagnosis not present

## 2018-04-10 DIAGNOSIS — I110 Hypertensive heart disease with heart failure: Secondary | ICD-10-CM | POA: Diagnosis not present

## 2018-04-10 DIAGNOSIS — K8010 Calculus of gallbladder with chronic cholecystitis without obstruction: Secondary | ICD-10-CM | POA: Diagnosis not present

## 2018-04-10 DIAGNOSIS — I2510 Atherosclerotic heart disease of native coronary artery without angina pectoris: Secondary | ICD-10-CM | POA: Diagnosis not present

## 2018-04-10 DIAGNOSIS — I5033 Acute on chronic diastolic (congestive) heart failure: Secondary | ICD-10-CM | POA: Diagnosis not present

## 2018-04-10 LAB — CBC
HCT: 42.2 % (ref 36.0–46.0)
Hemoglobin: 11.9 g/dL — ABNORMAL LOW (ref 12.0–15.0)
MCH: 27.8 pg (ref 26.0–34.0)
MCHC: 28.2 g/dL — ABNORMAL LOW (ref 30.0–36.0)
MCV: 98.6 fL (ref 80.0–100.0)
Platelets: 157 10*3/uL (ref 150–400)
RBC: 4.28 MIL/uL (ref 3.87–5.11)
RDW: 14.9 % (ref 11.5–15.5)
WBC: 7.4 10*3/uL (ref 4.0–10.5)
nRBC: 0 % (ref 0.0–0.2)

## 2018-04-10 LAB — GLUCOSE, CAPILLARY
Glucose-Capillary: 133 mg/dL — ABNORMAL HIGH (ref 70–99)
Glucose-Capillary: 152 mg/dL — ABNORMAL HIGH (ref 70–99)

## 2018-04-10 MED ORDER — TRAMADOL HCL 50 MG PO TABS: 50.0000 mg | ORAL_TABLET | Freq: Four times a day (QID) | ORAL | 0 refills | Status: DC | PRN

## 2018-04-10 NOTE — Discharge Summary (Signed)
Patient ID: [name] [id number] 72 y.o. Mar 20, 1945  04/09/2018  Discharge date and time: 04/10/2018  3:32 PM  Admitting Physician: Greer Pickerel, MD  Discharge Physician: Rosario Adie  Admission Diagnoses: SYMPTOMATIC CHOLELITHIASIS  Discharge Diagnoses: same  Operations: Procedure(s): LAPAROSCOPIC CHOLECYSTECTOMY    Discharged Condition: good    Hospital Course: Pt admitted for overnight observation  Consults: None  Significant Diagnostic Studies: labs: cbc, bmet  Treatments: IV hydration  Disposition: Home

## 2018-04-10 NOTE — Progress Notes (Signed)
1 Day Post-Op lap chole Subjective: Pt refused to wear CPAP.  Oxygenating well on home O2  Objective: Vital signs in last 24 hours: Temp:  [97.7 F (36.5 C)-99 F (37.2 C)] 98.4 F (36.9 C) (02/29 1005) Pulse Rate:  [65-79] 70 (02/29 1005) Resp:  [14-23] 18 (02/29 1005) BP: (106-142)/(58-81) 106/60 (02/29 1005) SpO2:  [92 %-99 %] 97 % (02/29 1005)   Intake/Output from previous day: 02/28 0701 - 02/29 0700 In: 1636.2 [P.O.:420; I.V.:1016.2; IV Piggyback:200] Out: 1520 [Urine:1500; Blood:20] Intake/Output this shift: Total I/O In: 120 [P.O.:120] Out: 0    General appearance: alert and cooperative GI: soft, nontender   Incision: no significant drainage  Lab Results:  Recent Labs    04/10/18 0446  WBC 7.4  HGB 11.9*  HCT 42.2  PLT 157   BMET No results for input(s): NA, K, CL, CO2, GLUCOSE, BUN, CREATININE, CALCIUM in the last 72 hours. PT/INR No results for input(s): LABPROT, INR in the last 72 hours. ABG No results for input(s): PHART, HCO3 in the last 72 hours.  Invalid input(s): PCO2, PO2  MEDS, Scheduled . acetaminophen  650 mg Oral Q6H  . bisoprolol  5 mg Oral Daily  . colchicine  0.6 mg Oral Daily  . cycloSPORINE  1 drop Both Eyes BID  . docusate sodium  100 mg Oral BID  . enoxaparin (LOVENOX) injection  40 mg Subcutaneous Q24H  . febuxostat  40 mg Oral Daily  . gabapentin  200 mg Oral BID  . insulin aspart  0-15 Units Subcutaneous TID WC  . isosorbide mononitrate  60 mg Oral Daily  . linagliptin  5 mg Oral Daily  . mometasone-formoterol  2 puff Inhalation BID  . pantoprazole  40 mg Oral Daily  . polyvinyl alcohol  1 drop Both Eyes TID  . potassium chloride SA  20 mEq Oral Daily  . spironolactone  25 mg Oral QHS  . torsemide  40 mg Oral Daily   And  . torsemide  20 mg Oral QPM    Studies/Results: No results found.  Assessment: s/p Procedure(s): LAPAROSCOPIC CHOLECYSTECTOMY Patient Active Problem List   Diagnosis Date Noted  . S/P  laparoscopic cholecystectomy 04/09/2018  . Chronic respiratory failure with hypoxia and hypercapnia ([place]) 03/07/2018  . Polyp of cecum   . Polyp of transverse colon   . Positive colorectal cancer screening using Cologuard test   . Dysphagia   . Acute on chronic renal failure ([place]) 11/20/2017  . CAD (coronary artery disease) 11/20/2017  . DM2 (diabetes mellitus, type 2) ([place]) 11/20/2017  . Epigastric abdominal pain 11/20/2017  . Vasomotor rhinitis 10/16/2016  . Right facial numbness 03/05/2016  . CKD (chronic kidney disease), stage III ([place]) 03/05/2016  . Atrial fibrillation ([place]) 10/24/2015  . Chest pain with moderate risk of acute coronary syndrome 10/23/2015  . History of TIA (transient ischemic attack) 10/22/2015  . Complete heart block ([place]) 06/22/2015  . Allergic drug rash due to anti-infective agent 04/29/2015  . Fatigue 02/14/2015  . Chronic diastolic heart failure ([place]) 02/14/2015  . Cellulitis 12/21/2014  . S/P CABG x 5 11/29/2014  . CAD S/P percutaneous coronary angioplasty   . Diarrhea 07/12/2014  . Generalized abdominal pain 07/12/2014  . Diastolic CHF, acute on chronic (HCC) 11/04/2013  . Mixed hypercholesterolemia and hypertriglyceridemia 04/22/2013  . Vertigo 04/22/2013  . Dyspnea on exertion 08/25/2012  . Allergic to IV contrast 07/23/2012  . Unstable angina ([place]) 07/22/2012  . Morbid (severe) obesity due to excess calories ([place])  11/22/2011  . Nonischemic cardiomyopathy ([place]) 11/22/2011  . GERD (gastroesophageal reflux disease) 11/22/2011  . Back pain 11/22/2011  . OSA (obstructive sleep apnea) 11/22/2011  . HEMORRHOIDS-EXTERNAL 02/21/2010  . NAUSEA 02/21/2010  . ABDOMINAL PAIN -GENERALIZED 02/21/2010  . PERSONAL HX COLONIC POLYPS 02/21/2010  . ANEMIA 04/16/2007  . Essential hypertension 04/16/2007  . DIVERTICULOSIS, COLON 04/16/2007  . ARTHRITIS 04/16/2007  . INTERNAL HEMORRHOIDS WITHOUT MENTION COMP 04/12/2007  . Asthma 04/12/2007  . Cardiac  resynchronization therapy pacemaker (CRT-P) in place 04/12/2007  . Gastritis without bleeding 12/14/2006  . COLONIC POLYPS, HYPERPLASTIC 09/27/2002  . FATTY LIVER DISEASE 02/16/2002    Expected post op course  Plan: Ambulate pt  Cont diet Cont home O2 D/c home if stable later today   LOS: 0 days     .Rosario Adie, [facility], [place]   04/10/2018 10:28 AM

## 2018-04-10 NOTE — Discharge Instructions (Signed)
Resume plavix on day of discharge  [facility] LAPAROSCOPIC SURGERY: POST OP INSTRUCTIONS Always review your discharge instruction sheet given to you by the facility where your surgery was performed. IF YOU HAVE DISABILITY OR FAMILY LEAVE FORMS, YOU MUST BRING THEM TO THE OFFICE FOR PROCESSING.   DO NOT GIVE THEM TO YOUR DOCTOR.  PAIN CONTROL  1. First take acetaminophen (Tylenol) AND/or ibuprofen (Advil) to control your pain after surgery.  Follow directions on package.  Taking acetaminophen (Tylenol) and/or ibuprofen (Advil) regularly after surgery will help to control your pain and lower the amount of prescription pain medication you may need.  You should not take more than 3,000 mg (3 grams) of acetaminophen (Tylenol) in 24 hours.  You should not take ibuprofen (Advil), aleve, motrin, naprosyn or other NSAIDS if you have a history of stomach ulcers or chronic kidney disease.  SEE PAIN CONTROL HANDOUT 2. A prescription for pain medication may be given to you upon discharge.  Take your pain medication as prescribed, if you still have uncontrolled pain after taking acetaminophen (Tylenol) or ibuprofen (Advil). 3. Use ice packs to help control pain. 4. If you need a refill on your pain medication, please contact your pharmacy.  They will contact our office to request authorization. Prescriptions will not be filled after 5pm or on week-ends.  HOME MEDICATIONS 5. Take your usually prescribed medications unless otherwise directed.  DIET 6. You should follow a light diet the first few days after arrival home.  Be sure to include lots of fluids daily. Avoid fatty, fried foods.   CONSTIPATION 7. It is common to experience some constipation after surgery and if you are taking pain medication.  Increasing fluid intake and taking a stool softener (such as Colace) will usually help or prevent this problem from occurring.  A mild laxative (Milk of Magnesia or Miralax) should be  taken according to package instructions if there are no bowel movements after 48 hours.  WOUND/INCISION CARE 8. Most patients will experience some swelling and bruising in the area of the incisions.  Ice packs will help.  Swelling and bruising can take several days to resolve.  9. Unless discharge instructions indicate otherwise, follow guidelines below  a. STERI-STRIPS - you may remove your outer bandages 48 hours after surgery, and you may shower at that time.  You have steri-strips (small skin tapes) in place directly over the incision.  These strips should be left on the skin for 7-10 days.   b. DERMABOND/SKIN GLUE - you may shower in 24 hours.  The glue will flake off over the next 2-3 weeks. 10. Any sutures or staples will be removed at the office during your follow-up visit.  ACTIVITIES 11. You may resume regular (light) daily activities beginning the next day--such as daily self-care, walking, climbing stairs--gradually increasing activities as tolerated.  You may have sexual intercourse when it is comfortable.  Refrain from any heavy lifting or straining until approved by your doctor. a. You may drive when you are no longer taking prescription pain medication, you can comfortably wear a seatbelt, and you can safely maneuver your car and apply brakes.  FOLLOW-UP 12. You should see your doctor in the office for a follow-up appointment approximately 2-3 weeks after your surgery.  You should have been given your post-op/follow-up appointment when your surgery was scheduled.  If you did not receive a post-op/follow-up appointment, make sure that you call for this appointment within a day or two after  you arrive home to insure a convenient appointment time.  OTHER INSTRUCTIONS 13.   WHEN TO CALL YOUR DOCTOR: 1. Fever over 101.0 2. Inability to urinate 3. Continued bleeding from incision. 4. Increased pain, redness, or drainage from the incision. 5. Increasing abdominal pain  The clinic  staff is available to answer your questions during regular business hours.  Please dont hesitate to call and ask to speak to one of the nurses for clinical concerns.  If you have a medical emergency, go to the nearest emergency room or call 911.  A surgeon from [facility] is always on call at the hospital. [address] [place], [place], [place]  [address] ? [address], [place], [place]   [address] [phone] ? [phone] ? FAX [phone] Web site: [email or web address]     Managing Your Pain After Surgery Without Opioids    Thank you for participating in our program to help patients manage their pain after surgery without opioids. This is part of our effort to provide you with the best care possible, without exposing you or your family to the risk that opioids pose.  What pain can I expect after surgery? You can expect to have some pain after surgery. This is normal. The pain is typically worse the day after surgery, and quickly begins to get better. Many studies have found that many patients are able to manage their pain after surgery with Over-the-Counter (OTC) medications such as Tylenol and Motrin. If you have a condition that does not allow you to take Tylenol or Motrin, notify your surgical team.  How will I manage my pain? The best strategy for controlling your pain after surgery is around the clock pain control with Tylenol (acetaminophen) and Motrin (ibuprofen or Advil). Alternating these medications with each other allows you to maximize your pain control. In addition to Tylenol and Motrin, you can use heating pads or ice packs on your incisions to help reduce your pain.  How will I alternate your regular strength over-the-counter pain medication? You will take a dose of pain medication every three hours. ; Start by taking 650 mg of Tylenol (2 pills of 325 mg) ; 3 hours later take 600 mg of Motrin (3 pills of 200 mg) ; 3 hours  after taking the Motrin take 650 mg of Tylenol ; 3 hours after that take 600 mg of Motrin.   - 1 -  See example - if your first dose of Tylenol is at 12:00 PM   12:00 PM Tylenol 650 mg (2 pills of 325 mg)  3:00 PM Motrin 600 mg (3 pills of 200 mg)  6:00 PM Tylenol 650 mg (2 pills of 325 mg)  9:00 PM Motrin 600 mg (3 pills of 200 mg)  Continue alternating every 3 hours   We recommend that you follow this schedule around-the-clock for at least 3 days after surgery, or until you feel that it is no longer needed. Use the table on the last page of this handout to keep track of the medications you are taking. Important: Do not take more than 3000mg  of Tylenol or 2300mg  of Motrin in a 24-hour period. Do not take ibuprofen/Motrin if you have a history of bleeding stomach ulcers, severe kidney disease, &/or actively taking a blood thinner  What if I still have pain? If you have pain that is not controlled with the over-the-counter pain medications (Tylenol and Motrin or Advil) you might have what we call breakthrough pain. You  will receive a prescription for a small amount of an opioid pain medication such as Oxycodone, Tramadol, or Tylenol with Codeine. Use these opioid pills in the first 24 hours after surgery if you have breakthrough pain. Do not take more than 1 pill every 4-6 hours.  If you still have uncontrolled pain after using all opioid pills, don't hesitate to call our staff using the number provided. We will help make sure you are managing your pain in the best way possible, and if necessary, we can provide a prescription for additional pain medication.   Day 1    Time  Name of Medication Number of pills taken  Amount of Acetaminophen  Pain Level   Comments  AM PM       AM PM       AM PM       AM PM       AM PM       AM PM       AM PM       AM PM       Total Daily amount of Acetaminophen Do not take more than  3,000 mg per day      Day 2    Time  Name of  Medication Number of pills taken  Amount of Acetaminophen  Pain Level   Comments  AM PM       AM PM       AM PM       AM PM       AM PM       AM PM       AM PM       AM PM       Total Daily amount of Acetaminophen Do not take more than  3,000 mg per day      Day 3    Time  Name of Medication Number of pills taken  Amount of Acetaminophen  Pain Level   Comments  AM PM       AM PM       AM PM       AM PM          AM PM       AM PM       AM PM       AM PM       Total Daily amount of Acetaminophen Do not take more than  3,000 mg per day      Day 4    Time  Name of Medication Number of pills taken  Amount of Acetaminophen  Pain Level   Comments  AM PM       AM PM       AM PM       AM PM       AM PM       AM PM       AM PM       AM PM       Total Daily amount of Acetaminophen Do not take more than  3,000 mg per day      Day 5    Time  Name of Medication Number of pills taken  Amount of Acetaminophen  Pain Level   Comments  AM PM       AM PM       AM PM       AM PM       AM PM  AM PM       AM PM       AM PM       Total Daily amount of Acetaminophen Do not take more than  3,000 mg per day       Day 6    Time  Name of Medication Number of pills taken  Amount of Acetaminophen  Pain Level  Comments  AM PM       AM PM       AM PM       AM PM       AM PM       AM PM       AM PM       AM PM       Total Daily amount of Acetaminophen Do not take more than  3,000 mg per day      Day 7    Time  Name of Medication Number of pills taken  Amount of Acetaminophen  Pain Level   Comments  AM PM       AM PM       AM PM       AM PM       AM PM       AM PM       AM PM       AM PM       Total Daily amount of Acetaminophen Do not take more than  3,000 mg per day        For additional information about how and where to safely dispose of unused opioid medications - https://www.morepowerfulnc.org  Disclaimer: This  document contains information and/or instructional materials adapted from [facility] for the typical patient with your condition. It does not replace medical advice from your health care provider because your experience may differ from that of the typical patient. Talk to your health care provider if you have any questions about this document, your condition or your treatment plan. Adapted from [facility]   

## 2018-04-12 ENCOUNTER — Ambulatory Visit: Payer: Medicare HMO | Admitting: Internal Medicine

## 2018-04-12 ENCOUNTER — Encounter (HOSPITAL_COMMUNITY): Payer: Self-pay | Admitting: General Surgery

## 2018-04-12 NOTE — Anesthesia Postprocedure Evaluation (Signed)
Anesthesia Post Note  Patient: [name]  Procedure(s) Performed: LAPAROSCOPIC CHOLECYSTECTOMY (N/A Abdomen)     Patient location during evaluation: PACU Anesthesia Type: General Level of consciousness: awake and alert Pain management: pain level controlled Vital Signs Assessment: post-procedure vital signs reviewed and stable Respiratory status: spontaneous breathing, nonlabored ventilation, respiratory function stable and patient connected to nasal cannula oxygen Cardiovascular status: blood pressure returned to baseline and stable Postop Assessment: no apparent nausea or vomiting Anesthetic complications: no    Last Vitals:  Vitals:   04/10/18 1005 04/10/18 1508  BP: 106/60 (!) 117/56  Pulse: 70 68  Resp: 18 17  Temp: 36.9 C 36.9 C  SpO2: 97% 98%    Last Pain:  Vitals:   04/10/18 1508  TempSrc: Oral  PainSc:                  Effie Berkshire

## 2018-04-13 ENCOUNTER — Ambulatory Visit (INDEPENDENT_AMBULATORY_CARE_PROVIDER_SITE_OTHER): Payer: Medicare HMO | Admitting: Pulmonary Disease

## 2018-04-13 ENCOUNTER — Encounter: Payer: Self-pay | Admitting: Pulmonary Disease

## 2018-04-13 ENCOUNTER — Ambulatory Visit: Payer: Medicare HMO | Admitting: Adult Health

## 2018-04-13 ENCOUNTER — Telehealth: Payer: Self-pay | Admitting: Internal Medicine

## 2018-04-13 ENCOUNTER — Ambulatory Visit (INDEPENDENT_AMBULATORY_CARE_PROVIDER_SITE_OTHER)
Admission: RE | Admit: 2018-04-13 | Discharge: 2018-04-13 | Disposition: A | Discharge status: Home / Self Care | Payer: Medicare HMO | Source: Ambulatory Visit | Attending: Pulmonary Disease | Admitting: Pulmonary Disease

## 2018-04-13 VITALS — BP 130/64 | HR 68 | Ht 66.0 in | Wt 247.0 lb

## 2018-04-13 DIAGNOSIS — J309 Allergic rhinitis, unspecified: Secondary | ICD-10-CM | POA: Diagnosis not present

## 2018-04-13 DIAGNOSIS — R0609 Other forms of dyspnea: Secondary | ICD-10-CM | POA: Diagnosis not present

## 2018-04-13 DIAGNOSIS — J9612 Chronic respiratory failure with hypercapnia: Secondary | ICD-10-CM

## 2018-04-13 DIAGNOSIS — R079 Chest pain, unspecified: Secondary | ICD-10-CM | POA: Diagnosis not present

## 2018-04-13 DIAGNOSIS — J9611 Chronic respiratory failure with hypoxia: Secondary | ICD-10-CM

## 2018-04-13 DIAGNOSIS — J4520 Mild intermittent asthma, uncomplicated: Secondary | ICD-10-CM

## 2018-04-13 DIAGNOSIS — G4733 Obstructive sleep apnea (adult) (pediatric): Secondary | ICD-10-CM | POA: Diagnosis not present

## 2018-04-13 DIAGNOSIS — R0600 Dyspnea, unspecified: Secondary | ICD-10-CM

## 2018-04-13 DIAGNOSIS — R0602 Shortness of breath: Secondary | ICD-10-CM | POA: Diagnosis not present

## 2018-04-13 MED ORDER — PREDNISONE 10 MG PO TABS: ORAL_TABLET | ORAL | 0 refills | Status: DC

## 2018-04-13 NOTE — Patient Instructions (Addendum)
Chest xray today   Prednisone 10mg  tablet  >>>4 tabs for 2 days, then 3 tabs for 2 days, 2 tabs for 2 days, then 1 tab for 2 days, then stop >>>take with food  >>>take in the morning   Continue daily Zyrtec  Start nasal saline rinses 2-3 times daily  Continue Atrovent nasal spray  Please start taking chlorpheniramine (aka Chlor tabs) 4 mg tablet (1 to 2 tablets at night) for management of allergies and postnasal drip at night - use this as needed >>> This is an over-the-counter medication >>> This medication is sedating  Continue Symbicort 160 >>> 2 puffs in the morning right when you wake up, rinse out your mouth after use, 12 hours later 2 puffs, rinse after use >>> Take this daily, no matter what >>> This is not a rescue inhaler   Continue oxygen therapy as prescribed  >>>maintain oxygen saturations greater than 88 percent  >>>if unable to maintain oxygen saturations please contact the office  >>>do not smoke with oxygen  >>>can use nasal saline gel or nasal saline rinses to moisturize nose if oxygen causes dryness   Follow-up with Dr. Melvyn Novas in 4 to 6 weeks or sooner if symptoms are worsening   It is flu season:   >>> Best ways to protect herself from the flu: Receive the yearly flu vaccine, practice good hand hygiene washing with soap and also using hand sanitizer when available, eat a nutritious meals, get adequate rest, hydrate appropriately   Please contact the office if your symptoms worsen or you have concerns that you are not improving.   Thank you for choosing [facility] for your healthcare, and for allowing [place] to partner with you on your healthcare journey. I am thankful to be able to provide care to you today.   Wyn Quaker FNP-C    Allergic Rhinitis, Adult Allergic rhinitis is a reaction to allergens in the air. Allergens are tiny specks (particles) in the air that cause your body to have an allergic reaction. This condition cannot be passed from  person to person (is not contagious). Allergic rhinitis cannot be cured, but it can be controlled. There are two types of allergic rhinitis:  Seasonal. This type is also called hay fever. It happens only during certain times of the year.  Perennial. This type can happen at any time of the year. What are the causes? This condition may be caused by:  Pollen from grasses, trees, and weeds.  House dust mites.  Pet dander.  Mold. What are the signs or symptoms? Symptoms of this condition include:  Sneezing.  Runny or stuffy nose (nasal congestion).  A lot of mucus in the back of the throat (postnasal drip).  Itchy nose.  Tearing of the eyes.  Trouble sleeping.  Being sleepy during day. How is this treated? There is no cure for this condition. You should avoid things that trigger your symptoms (allergens). Treatment can help to relieve symptoms. This may include:  Medicines that block allergy symptoms, such as antihistamines. These may be given as a shot, nasal spray, or pill.  Shots that are given until your body becomes less sensitive to the allergen (desensitization).  Stronger medicines, if all other treatments have not worked. Follow these instructions at home: Avoiding allergens   Find out what you are allergic to. Common allergens include smoke, dust, and pollen.  Avoid them if you can. These are some of the things that you can do to avoid allergens: ?  Replace carpet with wood, tile, or vinyl flooring. Carpet can trap dander and dust. ? Clean any mold found in the home. ? Do not smoke. Do not allow smoking in your home. ? Change your heating and air conditioning filter at least once a month. ? During allergy season:  Keep windows closed as much as you can. If possible, use air conditioning when there is a lot of pollen in the air.  Use a special filter for allergies with your furnace and air conditioner.  Plan outdoor activities when pollen counts are lowest.  This is usually during the early morning or evening hours.  If you do go outdoors when pollen count is high, wear a special mask for people with allergies.  When you come indoors, take a shower and change your clothes before sitting on furniture or bedding. General instructions  Do not use fans in your home.  Do not hang clothes outside to dry.  Wear sunglasses to keep pollen out of your eyes.  Wash your hands right away after you touch household pets.  Take over-the-counter and prescription medicines only as told by your doctor.  Keep all follow-up visits as told by your doctor. This is important. Contact a doctor if:  You have a fever.  You have a cough that does not go away (is persistent).  You start to make whistling sounds when you breathe (wheeze).  Your symptoms do not get better with treatment.  You have thick fluid coming from your nose.  You start to have nosebleeds. Get help right away if:  Your tongue or your lips are swollen.  You have trouble breathing.  You feel dizzy or you feel like you are going to pass out (faint).  You have cold sweats. Summary  Allergic rhinitis is a reaction to allergens in the air.  This condition may be caused by allergens. These include pollen, dust mites, pet dander, and mold.  Symptoms include a runny, itchy nose, sneezing, or tearing eyes. You may also have trouble sleeping or feel sleepy during the day.  Treatment includes taking medicines and avoiding allergens. You may also get shots or take stronger medicines.  Get help if you have a fever or a cough that does not stop. Get help right away if you are short of breath. This information is not intended to replace advice given to you by your health care provider. Make sure you discuss any questions you have with your health care provider. Document Released: 05/29/2010 Document Revised: 08/18/2017 Document Reviewed: 08/18/2017 Elsevier Interactive Patient Education   2019 [place] Oxygen Use, Adult When a medical condition keeps you from getting enough oxygen, your health care provider may instruct you to take extra oxygen at home. Your health care provider will let you know:  When to take oxygen.  For how long to take oxygen.  How quickly oxygen should be delivered (flow rate), in liters per minute (LPM or L/M). Home oxygen can be given through:  A mask.  A nasal cannula. This is a device or tube that goes in the nostrils.  A transtracheal catheter. This is a small, flexible tube placed in the trachea.  A tracheostomy. This is a surgically made opening in the trachea. These devices are connected with tubing to an oxygen source, such as:  A tank. Tanks hold oxygen in gas form. They must be replaced when the oxygen is used up.  A liquid oxygen device. This holds oxygen in liquid  form. It must be replaced when the oxygen is used up.  An oxygen concentrator machine. This filters oxygen in the room. It uses electricity, so you must have a backup cylinder of oxygen in case the power goes out. Supplies needed: To use oxygen, you will need:  A mask, nasal cannula, transtracheal catheter, or tracheostomy.  An oxygen tank, a liquid oxygen device, or an oxygen concentrator.  The tape that your health care provider recommends (optional). If you use a transtracheal catheter and your prescribed flow rate is 1 LPM or greater, you will also need a humidifier. Risks and complications  Fire. This can happen if the oxygen is exposed to a heat source, flame, or spark.  Injury to skin. This can happen if liquid oxygen touches your skin.  Organ damage. This can happen if you get too little oxygen. How to use oxygen Your health care provider or a representative from your [place] will show you how to use your oxygen device. Follow her or his instructions. The instructions may look something like this: 1. Wash your hands. 2. If  you use an oxygen concentrator, make sure it is plugged in. 3. Place one end of the tube into the port on the tank, device, or machine. 4. Place the mask over your nose and mouth. Or, place the nasal cannula and secure it with tape if instructed. If you use a tracheostomy or transtracheal catheter, connect it to the oxygen source as directed. 5. Make sure the liter-flow setting on the machine is at the level prescribed by your health care provider. 6. Turn on the machine or adjust the knob on the tank or device to the correct liter-flow setting. 7. When you are done, turn off and unplug the machine, or turn the knob to OFF. How to clean and care for the oxygen supplies Nasal cannula  Clean it with a warm, wet cloth daily or as needed.  Wash it with a liquid soap once a week.  Rinse it thoroughly once or twice a week.  Replace it every 2-4 weeks.  If you have an infection, such as a cold or pneumonia, change the cannula when you get better. Mask  Replace it every 2-4 weeks.  If you have an infection, such as a cold or pneumonia, change the mask when you get better. Humidifier bottle  Wash the bottle between each refill: ? Wash it with soap and warm water. ? Rinse it thoroughly. ? Disinfect it and its top. ? Air-dry it.  Make sure it is dry before you refill it. Oxygen concentrator  Clean the air filter at least twice a week according to directions from your home medical equipment and service company.  Wipe down the cabinet every day. To do this: ? Unplug the unit. ? Wipe down the cabinet with a damp cloth. ? Dry the cabinet. Other equipment  Change any extra tubing every 1-3 months.  Follow instructions from your health care provider about taking care of any other equipment. Safety tips Fire safety tips   Keep your oxygen and oxygen supplies at least 5 ft away from sources of heat, flames, and sparks at all times.  Do not allow smoking near your oxygen. Put up "no  smoking" signs in your home. Avoid smoking areas when in public.  Do not use materials that can burn (are flammable) while you use oxygen.  When you go to a restaurant with portable oxygen, ask to be seated in the nonsmoking section.  Keep a Data processing manager close by. Let your fire department know that you have oxygen in your home.  Test your home smoke detectors regularly. Traveling  Secure your oxygen tank in the vehicle so that it does not move around. Follow instructions from your medical device company about how to safely secure your tank.  Make sure you have enough oxygen for the amount of time you will be away from home.  If you are planning air travel, contact the airline to find out if they allow the use of an approved portable oxygen concentrator. You may also need documents from your health care provider and medical device company before you travel. General safety tips  If you use an oxygen cylinder, make sure it is in a stand or secured to an object that will not move (fixed object).  If you use liquid oxygen, make sure its container is kept upright.  If you use an oxygen concentrator: ? Dance movement psychotherapist company. Make sure you are given priority service in the event that your power goes out. ? Avoid using extension cords, if possible. Follow these instructions at home:  Use oxygen only as told by your health care provider.  Do not use alcohol or other drugs that make you relax (sedating drugs) unless instructed. They can slow down your breathing rate and make it hard to get in enough oxygen.  Know how and when to order a refill of oxygen.  Always keep a spare tank of oxygen. Plan ahead for holidays when you may not be able to get a prescription filled.  Use water-based lubricants on your lips or nostrils. Do not use oil-based products like petroleum jelly.  To prevent skin irritation on your cheeks or behind your ears, tuck some gauze under the tubing. Contact a  health care provider if:  You get headaches often.  You have shortness of breath.  You have a lasting cough.  You have anxiety.  You are sleepy all the time.  You develop an illness that affects your breathing.  You cannot exercise at your regular level.  You are restless.  You have difficult or irregular breathing, and it is getting worse.  You have a fever.  You have persistent redness under your nose. Get help right away if:  You are confused.  You have blue lips or fingernails.  You are struggling to breathe. Summary  Your health care provider or a representative from your [place] will show you how to use your oxygen device. Follow her or his instructions.  If you use an oxygen concentrator, make sure it is plugged in.  Make sure the liter-flow setting on the machine is at the level prescribed by your health care provider.  Keep your oxygen and oxygen supplies at least 5 ft away from sources of heat, flames, and sparks at all times. This information is not intended to replace advice given to you by your health care provider. Make sure you discuss any questions you have with your health care provider. Document Released: 04/19/2003 Document Revised: 07/16/2017 Document Reviewed: 08/21/2015 Elsevier Interactive Patient Education  2019 [employer].

## 2018-04-13 NOTE — Telephone Encounter (Signed)
Def needs ov

## 2018-04-13 NOTE — Assessment & Plan Note (Signed)
Assessment: Patient reports she is CPAP intolerant  Plan: Continue oxygen therapy as prescribed Actively work on losing weight and reducing BMI to help better manage obstructive sleep apnea

## 2018-04-13 NOTE — Assessment & Plan Note (Signed)
Assessment: AR flare today  Plan: Prednisone today Start nasal saline rinses Continue Zyrtec daily Can start chlor tabs at night for management of postnasal drip and allergy-like symptoms Schedule follow-up with Dr. Melvyn Novas in 4 to 6 weeks

## 2018-04-13 NOTE — Assessment & Plan Note (Addendum)
Assessment: Maintained on Symbicort 160 BMI 39.8 2018 PFTs showing restrictive lung disease with minimal mild diffusion defect Recent laparoscopic cholecystectomy Patient reporting increased dyspnea expiratory wheeze on exam today  Plan: Chest x-ray today Continue Symbicort 160 Course of prednisone Follow-up with Dr. Melvyn Novas in 4 to 6 weeks

## 2018-04-13 NOTE — Telephone Encounter (Signed)
Spoke with pt, she had gallbladder surgery last Friday. She states MW told her that if her breathing got bad then to let him know. She is SOB resting and with exertion. She used her inhalers but she thinks she may need some prednisone to prevent going back to the hospital. She has been having a dry cough at times but it is not too bad. MW please advise.   Assessment & Plan Note by Tanda Rockers, MD at 03/07/2018 6:05 AM  Author: Tanda Rockers, MD Author Type: Physician Filed: 03/07/2018 6:09 AM  Note Status: Written Cosign: Cosign Not Required Encounter Date: 03/05/2018  Problem: Dyspnea on exertion  Editor: Tanda Rockers, MD (Physician)    10/10/2013  Walked RA @ nl pace x  2 laps @ 185 ft each stopped due to  Sob, no desat -  08/29/2016   Walked RA  2 laps @ 185 ft each stopped due to  Moderately fast pace and sob/ sat 91%  - Spirometry 08/29/2016  FEV1 1.23 (51%)  Ratio 86  with no  curvature  On symbicort 160 x 2 but poor hfa - FENO 08/29/2016  =   28 on symb 160 with poor hfa - 08/29/2016  After extensive coaching HFA effectiveness =    75% from a baseline of 25% so try symb 80 2bid - PFT's  10/16/2016  FEV1 1.31 (68 % ) ratio 93  p no  % improvement from saba p nothing prior to study with DLCO  78/75 % corrects to 118  % for alv volume  And ERV  22%  - 03/05/2018  After extensive coaching inhaler device,  effectiveness =    25%   Given how poorly she uses hfa I strongly doubt there is any active asthma here and the main problem she has is again restrictive related to obesity which would be her greatest challenge with any abd surgery, esp Lap chole (ie upper abdominal)   There is certainly a risk of post op complications including resp failure and may well need a bridge with bipap until fully mobile but this risk is not prohibitive and the risk of not doing surgery is greater esp if then has complications from GB dz  and needs surgery anyway.  Discussed in detail all the  indications,  usual  risks and alternatives  relative to the benefits with patient who agrees to proceed with surgery    I had an extended discussion with the patient reviewing all releva

## 2018-04-13 NOTE — Assessment & Plan Note (Signed)
Assessment: Patient reports she is failed CPAP therapy in the past Patient reports no changes in oxygen needs  Plan: Continue oxygen therapy as prescribed Chest x-ray today

## 2018-04-13 NOTE — Progress Notes (Signed)
@Patient  ID: [name], female    DOB: December 18, 1945, 73 y.o.   MRN: [id number]  Chief Complaint  Patient presents with  . Follow-up    SOB - post gallbladder removal Friday 04/09/18    Referring provider: No ref. provider found  HPI:  73 year old female former smoker (quit smoking 1970s) followed in our office for mild intermittent asthma  PMH: Morbid obesity, obstructive sleep apnea chronic kidney disease stage III, chronic diastolic heart failure, anemia Smoker/ Smoking History: Former smoker.  Quit 1970s. Maintenance: Symbicort 160 Pt of: Dr. Melvyn Novas  Recent [facility] Encounters:   04/09/2018-laparoscopic cholecystectomy by Dr. Redmond Pulling   04/13/2018  - Visit   73 year old female former smoker (quit 1970s) followed in our office for asthma as well as dyspnea on exertion.  Patient presenting today for an acute visit reporting that after completing laparoscopic cholecystectomy with Dr. Redmond Pulling on 04/09/2018 patient reports that she has had a slight worsening shortness of breath.  Patient is also endorsing that she is had worse wheezing.  Patient thinks that she occasionally also has increased shortness of breath when using a recliner laying flat.  Patient reports adherence to Symbicort 160.  Patient also reports improvement and resolution of symptoms from the beginning of February when she was treated telephonically with an antibiotic as well as a course of prednisone due to suspected sinusitis.   Tests:   04/05/2018- eosinophils relative 2, eosinophils absolute 0.1  09/23/2017-chest x-ray- no active cardiopulmonary disease  10/16/2016-pulmonary function test- FVC 1.61 (63% predicted), postbronchodilator ratio 93, postbronchodilator FEV1 1.31 (68% predicted) DLCO 78 >>>Severe restriction consistent with body habitus based on disproportionate reduction in ERV >>>Mild minimal diffusion defect  FENO:  Lab Results  Component Value Date   NITRICOXIDE 28 08/29/2016     PFT: PFT Results Latest Ref Rng & Units 10/16/2016 10/10/2013  FVC-Pre L 1.61 1.83  FVC-Predicted Pre % 63 68  FVC-Post L 1.41 1.98  FVC-Predicted Post % 55 74  Pre FEV1/FVC % % 86 87  Post FEV1/FCV % % 93 89  FEV1-Pre L 1.39 1.59  FEV1-Predicted Pre % 72 79  FEV1-Post L 1.31 1.75  DLCO UNC% % 78 87  DLCO COR %Predicted % 118 128  TLC L - 3.73  TLC % Predicted % - 81  RV % Predicted % - 92    Imaging: Dg Chest 2 View  Result Date: 04/13/2018 CLINICAL DATA:  Shortness of breath, chest pain EXAM: CHEST - 2 VIEW COMPARISON:  03/10/2017 FINDINGS: Cardiomegaly with right chest multi lead pacer defibrillator. No acute abnormality of the lungs. No focal airspace opacity. Disc degenerative disease and ankylosis of the thoracic spine. IMPRESSION: Cardiomegaly without acute abnormality of the lungs. Electronically Signed   By: Eddie Candle M.D.   On: 04/13/2018 15:43   Dg Wrist Complete Left  Result Date: 04/01/2018 CLINICAL DATA:  73 y/o  F; fall 1 week ago with persistent pain. EXAM: LEFT WRIST - COMPLETE 3+ VIEW COMPARISON:  10/19/2014 left wrist radiograph FINDINGS: Scapholunate separation. Chronic triquetrum fracture. No additional fracture identified. Osteoarthrosis of the basal joint with loss of the joint space, articular surface sclerosis, and osteophytosis. IMPRESSION: 1. Scapholunate separation. 2. Chronic triquetrum fracture. 3. Mild basal joint osteoarthrosis. Electronically Signed   By: Kristine Garbe M.D.   On: 04/01/2018 20:18      Specialty Problems      Pulmonary Problems   Asthma     - 10/10/2013 PFT's no sign airflow obst p am symbicort 160 >  try symbicort 80 2bid  - 10/16/2016 pfts s obst s am symbicort  - 10/16/2016  After extensive coaching HFA effectiveness =    25% > ok to continue symbicort 160 since worse on 80 2bid at pt request though doubt much asthma here and pulmonary f/u prn   10/16/2016-pulmonary function test- FVC 1.61 (63% predicted),  postbronchodilator ratio 93, postbronchodilator FEV1 1.31 (68% predicted) DLCO 78 >>>Severe restriction consistent with body habitus based on disproportionate reduction in ERV >>>Mild minimal diffusion defect      OSA (obstructive sleep apnea)    C-pap intol      Dyspnea on exertion    .10/10/2013  Walked RA @ nl pace x  2 laps @ 185 ft each stopped due to  Sob, no desat -  08/29/2016   Walked RA  2 laps @ 185 ft each stopped due to  Moderately fast pace and sob/ sat 91%  - Spirometry 08/29/2016  FEV1 1.23 (51%)  Ratio 86  with no  curvature  On symbicort 160 x 2 but poor hfa - FENO 08/29/2016  =   28 on symb 160 with poor hfa - 08/29/2016  After extensive coaching HFA effectiveness =    75% from a baseline of 25% so try symb 80 2bid - PFT's  10/16/2016  FEV1 1.31 (68 % ) ratio 93  p no  % improvement from saba p nothing prior to study with DLCO  78/75 % corrects to 118  % for alv volume  And ERV  22%  - 03/05/2018  After extensive coaching inhaler device,  effectiveness =    25%        Vasomotor rhinitis    Trial of atrovent 0.06% NS       Chronic respiratory failure with hypoxia and hypercapnia (HCC)    HC03  02/14/2018  = 33       Allergic rhinitis      Allergies  Allergen Reactions  . Ivp Dye [Iodinated Diagnostic Agents] Shortness Of Breath    No reaction to PO contrast with non-ionic dye.06-25-2014/rsm  . Shellfish Allergy Anaphylaxis  . Shellfish-Derived Products Anaphylaxis  . Sulfa Antibiotics Shortness Of Breath  . Iodine Hives  . Atorvastatin Other (See Comments)    Pt states "causes bilateral leg pain/cramps."  . Doxycycline Other (See Comments)    Unknown rxn per pt  . Cephalexin Itching and Rash  . Zithromax [Azithromycin] Rash    Immunization History  Administered Date(s) Administered  . Influenza Inj Mdck Quad Pf 01/12/2018  . Influenza Whole 11/11/2011  . Influenza, High Dose Seasonal PF 11/01/2016  . Influenza-Unspecified 11/20/2014, 12/04/2015  .  Pneumococcal-Unspecified 11/04/2014    Past Medical History:  Diagnosis Date  . AICD (automatic cardioverter/defibrillator) present   . Anemia   . Arthritis   . Asthma   . CAD (coronary artery disease)    a. 10/09/16 LHC: SVG->LAD patent, SVG->Diag patent, SVG->RCA patent, SVG->LCx occluded. EF 60%, b. 10/31/16 LHC DES to AV groove Circ, DES to intermed branch  . Cellulitis and abscess of foot 12/2014   RT FOOT  . CHF (congestive heart failure) ([place])   . Chronic bronchitis ([place])   . Chronic lower back pain   . Diverticulosis   . Facial numbness 02/2016  . Fatty liver disease, nonalcoholic   . Gastritis   . GERD (gastroesophageal reflux disease)   . Gout   . H/O hiatal hernia   . High cholesterol   . Hyperplastic colon polyp   .  Hypertension   . IBS (irritable bowel syndrome)   . Internal hemorrhoids   . Ischemic cardiomyopathy   . Obesity   . On home oxygen therapy    "2L at night" (10/31/2016)  . OSA (obstructive sleep apnea)    "can't tolerate a mask" (10/30/2016)  . Pneumonia    "couple times" (10/31/2016)  . Shortness of breath   . TIA (transient ischemic attack)    "recently" (10/31/2016)  . Type II diabetes mellitus (HCC)     Tobacco History: Social History   Tobacco Use  Smoking Status Former Smoker  . Packs/day: 0.25  . Years: 3.00  . Pack years: 0.75  . Types: Cigarettes  . Last attempt to quit: 02/11/1968  . Years since quitting: 50.2  Smokeless Tobacco Never Used   Counseling given: Yes  Continue to not smoke  Outpatient Encounter Medications as of 04/13/2018  Medication Sig  . acetaminophen (TYLENOL) 500 MG tablet Take 500 mg by mouth daily.   [name] albuterol (PROVENTIL HFA;VENTOLIN HFA) 108 (90 Base) MCG/ACT inhaler Inhale 2 puffs into the lungs every 6 (six) hours as needed for wheezing or shortness of breath.  [name] albuterol (PROVENTIL) (2.5 MG/3ML) 0.083% nebulizer solution Take 2.5 mg by nebulization every 6 (six) hours as needed for wheezing or  shortness of breath.  [name] aspirin EC 81 MG tablet Take 1 tablet (81 mg total) by mouth daily.  . bisoprolol (ZEBETA) 5 MG tablet TAKE 1 TABLET EVERY DAY (Patient taking differently: Take 5 mg by mouth daily. )  . budesonide-formoterol (SYMBICORT) 160-4.5 MCG/ACT inhaler Inhale 2 puffs into the lungs 2 (two) times daily.  . cetirizine (ZYRTEC) 10 MG tablet Take 10 mg by mouth daily.   . clopidogrel (PLAVIX) 75 MG tablet TAKE 1 TABLET EVERY DAY (Patient taking differently: Take 75 mg by mouth every evening. )  . clotrimazole (LOTRIMIN) 1 % cream Apply 1 application topically 2 (two) times daily. (Patient taking differently: Apply 1 application topically 2 (two) times daily as needed (rash). )  . clotrimazole (MYCELEX) 10 MG troche Take 10 mg by mouth daily as needed (Sore Mouth).   . colchicine 0.6 MG tablet Take 0.6 mg by mouth daily.   . diphenhydrAMINE (BENADRYL) 25 MG tablet Take 25 mg by mouth every 6 (six) hours as needed for allergies.  [name] docusate sodium (COLACE) 100 MG capsule Take 200 mg by mouth at bedtime.   [name] EPINEPHrine (EPIPEN 2-PAK) 0.3 mg/0.3 mL IJ SOAJ injection Inject 0.3 mg into the muscle once as needed (severe allergic reaction).  . ezetimibe (ZETIA) 10 MG tablet Take 1 tablet (10 mg total) by mouth daily.  . famotidine (PEPCID) 40 MG tablet Take 1 tablet (40 mg total) by mouth at bedtime.  . febuxostat (ULORIC) 40 MG tablet Take 40 mg by mouth daily.  . fluticasone (FLONASE) 50 MCG/ACT nasal spray Place 1 spray into both nostrils at bedtime as needed for rhinitis or allergies.   [name] guaiFENesin (MUCINEX) 600 MG 12 hr tablet Take 600 mg by mouth at bedtime as needed for cough or to loosen phlegm.   . hydrocortisone 2.5 % cream Apply 2.5 application topically daily as needed (itching).   . hydroxypropyl methylcellulose / hypromellose (ISOPTO TEARS / GONIOVISC) 2.5 % ophthalmic solution Place 1 drop into both eyes 3 (three) times daily.   [name] ipratropium (ATROVENT) 0.02 % nebulizer  solution Take 2.5 mLs (0.5 mg total) by nebulization 4 (four) times daily.  [name] ipratropium (ATROVENT) 0.06 % nasal spray  1-2 pffs up to 4 x daily as needed (Patient taking differently: Place 2 sprays into both nostrils 4 (four) times daily as needed for rhinitis. )  . isosorbide mononitrate (IMDUR) 60 MG 24 hr tablet Take 1 tablet (60 mg total) by mouth daily.  . metolazone (ZAROXOLYN) 5 MG tablet Take 1 tablet (5 mg total) by mouth as needed. Take only as directed by [facility] (Patient taking differently: Take 5 mg by mouth daily as needed (for weight gain of 3 LBS. or more). )  . Multiple Vitamins-Minerals (ALIVE WOMENS ENERGY PO) Take 1 tablet by mouth daily.  . nitroGLYCERIN (NITROSTAT) 0.4 MG SL tablet Place 1 tablet (0.4 mg total) under the tongue every 5 (five) minutes as needed for chest pain.  [name] ondansetron (ZOFRAN ODT) 4 MG disintegrating tablet Take 1 tablet (4 mg total) by mouth every 8 (eight) hours as needed. (Patient taking differently: Take 4 mg by mouth every 8 (eight) hours as needed for nausea or vomiting. )  . OXYGEN Place 2-3 L into the nose See admin instructions. 2 lpm with sleep and 3 with exertion- [facility]  . pantoprazole (PROTONIX) 40 MG tablet TAKE 1 TABLET EVERY DAY (Patient taking differently: Take 40 mg by mouth daily. )  . potassium chloride SA (K-DUR,KLOR-CON) 20 MEQ tablet TAKE 1 TABLET EVERY DAY (Patient taking differently: Take 20 mEq by mouth daily. )  . RESTASIS 0.05 % ophthalmic emulsion Place 1 drop into both eyes 2 (two) times daily.   . rosuvastatin (CRESTOR) 20 MG tablet TAKE 1 TABLET (20 MG TOTAL) BY MOUTH AT BEDTIME.  . saxagliptin HCl (ONGLYZA) 2.5 MG TABS tablet Take 2.5 mg by mouth daily.   [name] spironolactone (ALDACTONE) 25 MG tablet TAKE 1 TABLET EVERY DAY (Patient taking differently: Take 25 mg by mouth at bedtime. )  . torsemide (DEMADEX) 20 MG tablet TAKE 40 MG (2TAB) EVERY OTHER DAY ALTERNATING WITH 60 MG EVERY OTHER DAY (Patient taking  differently: Take 20-40 mg by mouth See admin instructions. Take 40 mg by mouth in the morning and 20 mg in the evening)  . traMADol (ULTRAM) 50 MG tablet Take 1 tablet (50 mg total) by mouth every 6 (six) hours as needed for moderate pain.  [name] triamcinolone cream (KENALOG) 0.1 % Apply 1 application topically 2 (two) times daily as needed (itching).   . [DISCONTINUED] levofloxacin (LEVAQUIN) 500 MG tablet Take 1 tablet (500 mg total) by mouth daily.  . [DISCONTINUED] predniSONE (DELTASONE) 10 MG tablet Take 4 tablets x2 days, take 2 tablets for x2 days, 1 tablet x2 days, then stop  . predniSONE (DELTASONE) 10 MG tablet 4 tabs for 2 days, then 3 tabs for 2 days, 2 tabs for 2 days, then 1 tab for 2 days, then stop   Facility-Administered Encounter Medications as of 04/13/2018  Medication  . diphenhydrAMINE (BENADRYL) injection 50 mg     Review of Systems  Review of Systems  Constitutional: Positive for fatigue. Negative for chills, fever and unexpected weight change.  HENT: Positive for congestion. Negative for sinus pressure and sinus pain.   Respiratory: Positive for cough (dry cough ), shortness of breath and wheezing. Negative for chest tightness.        +orthopnea   Cardiovascular: Negative for chest pain and palpitations.  Gastrointestinal: Negative for diarrhea, nausea and vomiting.  Musculoskeletal: Negative for arthralgias.  Skin: Negative for color change.  Allergic/Immunologic: Negative for environmental allergies and food allergies.  Neurological: Negative for dizziness, light-headedness and  headaches.  Psychiatric/Behavioral: Positive for sleep disturbance (intolerant of cpap ). Negative for dysphoric mood. The patient is not nervous/anxious.   All other systems reviewed and are negative.    Physical Exam  BP 130/64 (BP Location: Right Arm, Patient Position: Sitting, Cuff Size: Large)   Pulse 68   Ht 5\' 6"  (1.676 m)   Wt 247 lb (112 kg)   SpO2 95%   BMI 39.87 kg/m   Wt  Readings from Last 5 Encounters:  04/13/18 247 lb (112 kg)  04/09/18 259 lb (117.5 kg)  03/05/18 249 lb (112.9 kg)  02/24/18 251 lb (113.9 kg)  01/22/18 256 lb 6 oz (116.3 kg)   2 L via nasal cannula -oxygen stable today  Weight stable  Physical Exam  Constitutional: She is oriented to person, place, and time and well-developed, well-nourished, and in no distress. No distress.  Chronically ill obese elderly female  HENT:  Head: Normocephalic and atraumatic.  Right Ear: Hearing, tympanic membrane, external ear and ear canal normal.  Left Ear: Hearing, tympanic membrane, external ear and ear canal normal.  Nose: Mucosal edema and rhinorrhea present. Right sinus exhibits no maxillary sinus tenderness and no frontal sinus tenderness. Left sinus exhibits frontal sinus tenderness (Slight tenderness to palpation). Left sinus exhibits no maxillary sinus tenderness.  Mouth/Throat: Uvula is midline and oropharynx is clear and moist. No oropharyngeal exudate.  Postnasal drip  Eyes: Pupils are equal, round, and reactive to light.  Neck: Normal range of motion. Neck supple.  Cardiovascular: Normal rate, regular rhythm and normal heart sounds.  Distant heart tones  Pulmonary/Chest: Effort normal. No accessory muscle usage. No respiratory distress. She has no decreased breath sounds. She has wheezes (End expiratory wheeze). She has no rhonchi. She has no rales.  Diminished breath sounds throughout exam  Musculoskeletal: Normal range of motion.        General: Edema (Trace lower extremity edema, patient reports this is her baseline) present.  Lymphadenopathy:    She has no cervical adenopathy.  Neurological: She is alert and oriented to person, place, and time. Gait normal.  Skin: Skin is warm and dry. She is not diaphoretic. No erythema.  Psychiatric: Mood, memory, affect and judgment normal.  Nursing note and vitals reviewed.     Lab Results:  CBC    Component Value Date/Time   WBC 7.4  04/10/2018 0446   RBC 4.28 04/10/2018 0446   HGB 11.9 (L) 04/10/2018 0446   HGB 12.5 12/03/2017 1612   HCT 42.2 04/10/2018 0446   HCT 39.7 12/03/2017 1612   PLT 157 04/10/2018 0446   PLT 199 12/03/2017 1612   MCV 98.6 04/10/2018 0446   MCV 86 12/03/2017 1612   MCH 27.8 04/10/2018 0446   MCHC 28.2 (L) 04/10/2018 0446   RDW 14.9 04/10/2018 0446   RDW 13.5 12/03/2017 1612   LYMPHSABS 1.3 04/05/2018 1230   MONOABS 0.5 04/05/2018 1230   EOSABS 0.1 04/05/2018 1230   BASOSABS 0.0 04/05/2018 1230    BMET    Component Value Date/Time   NA 140 04/05/2018 1230   NA 143 12/03/2017 1612   K 4.6 04/05/2018 1230   CL 104 04/05/2018 1230   CO2 31 04/05/2018 1230   GLUCOSE 146 (H) 04/05/2018 1230   BUN 18 04/05/2018 1230   BUN 36 (H) 12/03/2017 1612   CREATININE 0.99 04/05/2018 1230   CREATININE 1.05 04/04/2014 1014   CALCIUM 9.0 04/05/2018 1230   GFRNONAA 57 (L) 04/05/2018 1230   GFRAA >60  04/05/2018 1230    BNP    Component Value Date/Time   BNP 172.5 (H) 09/23/2017 1010   BNP 55.7 10/18/2015 1204    ProBNP    Component Value Date/Time   PROBNP 66.0 09/01/2013 1707      Assessment & Plan:     Asthma Assessment: Maintained on Symbicort 160 BMI 39.8 2018 PFTs showing restrictive lung disease with minimal mild diffusion defect Recent laparoscopic cholecystectomy Patient reporting increased dyspnea expiratory wheeze on exam today  Plan: Chest x-ray today Continue Symbicort 160 Course of prednisone Follow-up with Dr. Melvyn Novas in 4 to 6 weeks  Chronic respiratory failure with hypoxia and hypercapnia ([place]) Assessment: Patient reports she is failed CPAP therapy in the past Patient reports no changes in oxygen needs  Plan: Continue oxygen therapy as prescribed Chest x-ray today   OSA (obstructive sleep apnea) Assessment: Patient reports she is CPAP intolerant  Plan: Continue oxygen therapy as prescribed Actively work on losing weight and reducing BMI to  help better manage obstructive sleep apnea  Allergic rhinitis Assessment: AR flare today  Plan: Prednisone today Start nasal saline rinses Continue Zyrtec daily Can start chlor tabs at night for management of postnasal drip and allergy-like symptoms Schedule follow-up with Dr. Melvyn Novas in 4 to 6 weeks  Dyspnea on exertion Assessment: Oxygen stable today Weights are stable today Potential orthopnea reported by patient Low suspicion for CHF exacerbation at this time Patient reports adherence to fluid pills Trace lower extremity edema which patient reports is baseline End expiratory wheeze today  Plan: Chest x-ray today Prednisone today Continue take fluid pills as prescribed Continue Symbicort as prescribed Follow-up with Dr. Melvyn Novas in 4 to 6 weeks      Lauraine Rinne, NP 04/13/2018   This appointment was 30 minutes long with over 50% of the time in direct face-to-face patient care, assessment, plan of care, and follow-up.

## 2018-04-13 NOTE — Telephone Encounter (Signed)
Called and spoke with Patient. Per Dr Melvyn Novas, Patient needs OV.  Patient scheduled with Wyn Quaker, NP, 04/13/18, at 2:30pm.  Nothing further at this time.

## 2018-04-13 NOTE — Assessment & Plan Note (Signed)
Assessment: Oxygen stable today Weights are stable today Potential orthopnea reported by patient Low suspicion for CHF exacerbation at this time Patient reports adherence to fluid pills Trace lower extremity edema which patient reports is baseline End expiratory wheeze today  Plan: Chest x-ray today Prednisone today Continue take fluid pills as prescribed Continue Symbicort as prescribed Follow-up with Dr. Melvyn Novas in 4 to 6 weeks

## 2018-04-15 NOTE — Progress Notes (Signed)
Your chest x-ray results of come back.  Showing no acute changes.  No plan of care changes at this time.  Keep follow-up appointment.    Follow-up with our office if symptoms worsen or you do not feel like you are improving under her current regimen.  It was a pleasure taking care of you,  Bekim Werntz, FNP 

## 2018-04-20 DIAGNOSIS — N183 Chronic kidney disease, stage 3 (moderate): Secondary | ICD-10-CM | POA: Diagnosis not present

## 2018-04-26 ENCOUNTER — Other Ambulatory Visit: Payer: Self-pay | Admitting: Cardiovascular Disease

## 2018-04-27 NOTE — Telephone Encounter (Signed)
Rx(s) sent to pharmacy electronically.  

## 2018-04-29 NOTE — Progress Notes (Signed)
Chart and office note reviewed in detail  > agree with a/p as outlined    

## 2018-04-30 DIAGNOSIS — I5032 Chronic diastolic (congestive) heart failure: Secondary | ICD-10-CM | POA: Diagnosis not present

## 2018-04-30 DIAGNOSIS — J4530 Mild persistent asthma, uncomplicated: Secondary | ICD-10-CM | POA: Diagnosis not present

## 2018-04-30 DIAGNOSIS — R269 Unspecified abnormalities of gait and mobility: Secondary | ICD-10-CM | POA: Diagnosis not present

## 2018-04-30 DIAGNOSIS — J4520 Mild intermittent asthma, uncomplicated: Secondary | ICD-10-CM | POA: Diagnosis not present

## 2018-05-01 ENCOUNTER — Emergency Department (HOSPITAL_COMMUNITY): Payer: Medicare HMO

## 2018-05-01 ENCOUNTER — Encounter (HOSPITAL_COMMUNITY): Payer: Self-pay | Admitting: Family Medicine

## 2018-05-01 ENCOUNTER — Other Ambulatory Visit: Payer: Self-pay

## 2018-05-01 ENCOUNTER — Observation Stay (HOSPITAL_COMMUNITY)
Admission: EM | Admit: 2018-05-01 | Discharge: 2018-05-02 | Disposition: A | Discharge status: Home / Self Care | Payer: Medicare HMO | Attending: Internal Medicine | Admitting: Internal Medicine

## 2018-05-01 DIAGNOSIS — K760 Fatty (change of) liver, not elsewhere classified: Secondary | ICD-10-CM | POA: Diagnosis not present

## 2018-05-01 DIAGNOSIS — E1122 Type 2 diabetes mellitus with diabetic chronic kidney disease: Secondary | ICD-10-CM | POA: Diagnosis not present

## 2018-05-01 DIAGNOSIS — D72819 Decreased white blood cell count, unspecified: Secondary | ICD-10-CM | POA: Diagnosis not present

## 2018-05-01 DIAGNOSIS — Z87891 Personal history of nicotine dependence: Secondary | ICD-10-CM | POA: Insufficient documentation

## 2018-05-01 DIAGNOSIS — Z9981 Dependence on supplemental oxygen: Secondary | ICD-10-CM | POA: Insufficient documentation

## 2018-05-01 DIAGNOSIS — R079 Chest pain, unspecified: Secondary | ICD-10-CM | POA: Diagnosis not present

## 2018-05-01 DIAGNOSIS — J9690 Respiratory failure, unspecified, unspecified whether with hypoxia or hypercapnia: Secondary | ICD-10-CM | POA: Diagnosis present

## 2018-05-01 DIAGNOSIS — Z888 Allergy status to other drugs, medicaments and biological substances status: Secondary | ICD-10-CM | POA: Insufficient documentation

## 2018-05-01 DIAGNOSIS — Z79899 Other long term (current) drug therapy: Secondary | ICD-10-CM | POA: Insufficient documentation

## 2018-05-01 DIAGNOSIS — J9621 Acute and chronic respiratory failure with hypoxia: Secondary | ICD-10-CM | POA: Insufficient documentation

## 2018-05-01 DIAGNOSIS — Z881 Allergy status to other antibiotic agents status: Secondary | ICD-10-CM | POA: Insufficient documentation

## 2018-05-01 DIAGNOSIS — Z8673 Personal history of transient ischemic attack (TIA), and cerebral infarction without residual deficits: Secondary | ICD-10-CM | POA: Insufficient documentation

## 2018-05-01 DIAGNOSIS — M109 Gout, unspecified: Secondary | ICD-10-CM | POA: Diagnosis not present

## 2018-05-01 DIAGNOSIS — Z955 Presence of coronary angioplasty implant and graft: Secondary | ICD-10-CM | POA: Insufficient documentation

## 2018-05-01 DIAGNOSIS — I2510 Atherosclerotic heart disease of native coronary artery without angina pectoris: Secondary | ICD-10-CM | POA: Diagnosis not present

## 2018-05-01 DIAGNOSIS — J189 Pneumonia, unspecified organism: Secondary | ICD-10-CM

## 2018-05-01 DIAGNOSIS — J181 Lobar pneumonia, unspecified organism: Secondary | ICD-10-CM | POA: Diagnosis not present

## 2018-05-01 DIAGNOSIS — Z9049 Acquired absence of other specified parts of digestive tract: Secondary | ICD-10-CM | POA: Diagnosis not present

## 2018-05-01 DIAGNOSIS — Z951 Presence of aortocoronary bypass graft: Secondary | ICD-10-CM | POA: Insufficient documentation

## 2018-05-01 DIAGNOSIS — J9622 Acute and chronic respiratory failure with hypercapnia: Secondary | ICD-10-CM | POA: Insufficient documentation

## 2018-05-01 DIAGNOSIS — G4733 Obstructive sleep apnea (adult) (pediatric): Secondary | ICD-10-CM | POA: Diagnosis not present

## 2018-05-01 DIAGNOSIS — J123 Human metapneumovirus pneumonia: Secondary | ICD-10-CM | POA: Diagnosis not present

## 2018-05-01 DIAGNOSIS — R109 Unspecified abdominal pain: Secondary | ICD-10-CM | POA: Insufficient documentation

## 2018-05-01 DIAGNOSIS — R05 Cough: Secondary | ICD-10-CM | POA: Diagnosis not present

## 2018-05-01 DIAGNOSIS — Z833 Family history of diabetes mellitus: Secondary | ICD-10-CM | POA: Insufficient documentation

## 2018-05-01 DIAGNOSIS — Z7951 Long term (current) use of inhaled steroids: Secondary | ICD-10-CM | POA: Insufficient documentation

## 2018-05-01 DIAGNOSIS — I130 Hypertensive heart and chronic kidney disease with heart failure and stage 1 through stage 4 chronic kidney disease, or unspecified chronic kidney disease: Secondary | ICD-10-CM | POA: Diagnosis not present

## 2018-05-01 DIAGNOSIS — E7800 Pure hypercholesterolemia, unspecified: Secondary | ICD-10-CM | POA: Insufficient documentation

## 2018-05-01 DIAGNOSIS — R0602 Shortness of breath: Secondary | ICD-10-CM | POA: Diagnosis not present

## 2018-05-01 DIAGNOSIS — I255 Ischemic cardiomyopathy: Secondary | ICD-10-CM | POA: Diagnosis not present

## 2018-05-01 DIAGNOSIS — K580 Irritable bowel syndrome with diarrhea: Secondary | ICD-10-CM | POA: Diagnosis not present

## 2018-05-01 DIAGNOSIS — N183 Chronic kidney disease, stage 3 (moderate): Secondary | ICD-10-CM

## 2018-05-01 DIAGNOSIS — Z882 Allergy status to sulfonamides status: Secondary | ICD-10-CM | POA: Insufficient documentation

## 2018-05-01 DIAGNOSIS — Z9861 Coronary angioplasty status: Secondary | ICD-10-CM

## 2018-05-01 DIAGNOSIS — J90 Pleural effusion, not elsewhere classified: Secondary | ICD-10-CM | POA: Diagnosis not present

## 2018-05-01 DIAGNOSIS — Z825 Family history of asthma and other chronic lower respiratory diseases: Secondary | ICD-10-CM | POA: Insufficient documentation

## 2018-05-01 DIAGNOSIS — R0902 Hypoxemia: Secondary | ICD-10-CM | POA: Diagnosis not present

## 2018-05-01 DIAGNOSIS — J440 Chronic obstructive pulmonary disease with acute lower respiratory infection: Secondary | ICD-10-CM | POA: Insufficient documentation

## 2018-05-01 DIAGNOSIS — J4520 Mild intermittent asthma, uncomplicated: Secondary | ICD-10-CM

## 2018-05-01 DIAGNOSIS — Z7982 Long term (current) use of aspirin: Secondary | ICD-10-CM | POA: Insufficient documentation

## 2018-05-01 DIAGNOSIS — Z6838 Body mass index (BMI) 38.0-38.9, adult: Secondary | ICD-10-CM | POA: Insufficient documentation

## 2018-05-01 DIAGNOSIS — J9611 Chronic respiratory failure with hypoxia: Secondary | ICD-10-CM | POA: Diagnosis present

## 2018-05-01 DIAGNOSIS — D696 Thrombocytopenia, unspecified: Secondary | ICD-10-CM | POA: Insufficient documentation

## 2018-05-01 DIAGNOSIS — J9612 Chronic respiratory failure with hypercapnia: Secondary | ICD-10-CM

## 2018-05-01 DIAGNOSIS — Z95810 Presence of automatic (implantable) cardiac defibrillator: Secondary | ICD-10-CM | POA: Insufficient documentation

## 2018-05-01 DIAGNOSIS — K219 Gastro-esophageal reflux disease without esophagitis: Secondary | ICD-10-CM | POA: Diagnosis not present

## 2018-05-01 DIAGNOSIS — Z841 Family history of disorders of kidney and ureter: Secondary | ICD-10-CM | POA: Insufficient documentation

## 2018-05-01 DIAGNOSIS — I5032 Chronic diastolic (congestive) heart failure: Secondary | ICD-10-CM | POA: Insufficient documentation

## 2018-05-01 DIAGNOSIS — J9610 Chronic respiratory failure, unspecified whether with hypoxia or hypercapnia: Secondary | ICD-10-CM | POA: Diagnosis present

## 2018-05-01 DIAGNOSIS — Z8249 Family history of ischemic heart disease and other diseases of the circulatory system: Secondary | ICD-10-CM | POA: Insufficient documentation

## 2018-05-01 DIAGNOSIS — Z7902 Long term (current) use of antithrombotics/antiplatelets: Secondary | ICD-10-CM | POA: Insufficient documentation

## 2018-05-01 LAB — COMPREHENSIVE METABOLIC PANEL WITH GFR
ALT: 35 U/L (ref 0–44)
AST: 30 U/L (ref 15–41)
Albumin: 3.1 g/dL — ABNORMAL LOW (ref 3.5–5.0)
Alkaline Phosphatase: 106 U/L (ref 38–126)
Anion gap: 9 (ref 5–15)
BUN: 18 mg/dL (ref 8–23)
CO2: 28 mmol/L (ref 22–32)
Calcium: 9 mg/dL (ref 8.9–10.3)
Chloride: 101 mmol/L (ref 98–111)
Creatinine, Ser: 1.18 mg/dL — ABNORMAL HIGH (ref 0.44–1.00)
GFR calc Af Amer: 53 mL/min — ABNORMAL LOW
GFR calc non Af Amer: 45 mL/min — ABNORMAL LOW
Glucose, Bld: 134 mg/dL — ABNORMAL HIGH (ref 70–99)
Potassium: 3.8 mmol/L (ref 3.5–5.1)
Sodium: 138 mmol/L (ref 135–145)
Total Bilirubin: 0.5 mg/dL (ref 0.3–1.2)
Total Protein: 6 g/dL — ABNORMAL LOW (ref 6.5–8.1)

## 2018-05-01 LAB — CBC WITH DIFFERENTIAL/PLATELET
Abs Immature Granulocytes: 0.01 10*3/uL (ref 0.00–0.07)
Basophils Absolute: 0 10*3/uL (ref 0.0–0.1)
Basophils Relative: 1 %
Eosinophils Absolute: 0.2 10*3/uL (ref 0.0–0.5)
Eosinophils Relative: 7 %
HCT: 43.2 % (ref 36.0–46.0)
Hemoglobin: 12.6 g/dL (ref 12.0–15.0)
Immature Granulocytes: 0 %
Lymphocytes Relative: 20 %
Lymphs Abs: 0.6 10*3/uL — ABNORMAL LOW (ref 0.7–4.0)
MCH: 27.2 pg (ref 26.0–34.0)
MCHC: 29.2 g/dL — ABNORMAL LOW (ref 30.0–36.0)
MCV: 93.1 fL (ref 80.0–100.0)
Monocytes Absolute: 0.4 10*3/uL (ref 0.1–1.0)
Monocytes Relative: 14 %
Neutro Abs: 1.7 10*3/uL (ref 1.7–7.7)
Neutrophils Relative %: 58 %
Platelets: 111 10*3/uL — ABNORMAL LOW (ref 150–400)
RBC: 4.64 MIL/uL (ref 3.87–5.11)
RDW: 14 % (ref 11.5–15.5)
WBC: 2.9 10*3/uL — ABNORMAL LOW (ref 4.0–10.5)
nRBC: 0 % (ref 0.0–0.2)

## 2018-05-01 LAB — I-STAT TROPONIN, ED: Troponin i, poc: 0.02 ng/mL (ref 0.00–0.08)

## 2018-05-01 LAB — CBG MONITORING, ED: Glucose-Capillary: 139 mg/dL — ABNORMAL HIGH (ref 70–99)

## 2018-05-01 LAB — BRAIN NATRIURETIC PEPTIDE: B Natriuretic Peptide: 142.7 pg/mL — ABNORMAL HIGH (ref 0.0–100.0)

## 2018-05-01 LAB — TROPONIN I: Troponin I: <0.03 ng/mL (ref <0.03)

## 2018-05-01 LAB — GLUCOSE, CAPILLARY: Glucose-Capillary: 103 mg/dL — ABNORMAL HIGH (ref 70–99)

## 2018-05-01 MED ORDER — MOMETASONE FURO-FORMOTEROL FUM 200-5 MCG/ACT IN AERO
2.0000 | INHALATION_SPRAY | Freq: Two times a day (BID) | RESPIRATORY_TRACT | Status: DC
  Administered 2018-05-01 – 2018-05-02 (×2): 2 via RESPIRATORY_TRACT
  Filled 2018-05-01: qty 8.8

## 2018-05-01 MED ORDER — POLYVINYL ALCOHOL 1.4 % OP SOLN
1.0000 [drp] | Freq: Three times a day (TID) | OPHTHALMIC | Status: DC
  Administered 2018-05-01 – 2018-05-02 (×2): 1 [drp] via OPHTHALMIC
  Filled 2018-05-01: qty 15

## 2018-05-01 MED ORDER — AEROCHAMBER PLUS FLO-VU LARGE MISC: 1.0000 | Freq: Once | Status: DC

## 2018-05-01 MED ORDER — METOLAZONE 5 MG PO TABS
5.0000 mg | ORAL_TABLET | Freq: Every day | ORAL | Status: DC | PRN
  Filled 2018-05-01: qty 1

## 2018-05-01 MED ORDER — SPIRONOLACTONE 25 MG PO TABS
25.0000 mg | ORAL_TABLET | Freq: Every day | ORAL | Status: DC
  Administered 2018-05-01: 25 mg via ORAL
  Filled 2018-05-01: qty 1

## 2018-05-01 MED ORDER — ONDANSETRON HCL 4 MG/2ML IJ SOLN
4.0000 mg | Freq: Four times a day (QID) | INTRAMUSCULAR | Status: DC | PRN
  Administered 2018-05-02: 4 mg via INTRAVENOUS
  Filled 2018-05-01: qty 2

## 2018-05-01 MED ORDER — HYPROMELLOSE (GONIOSCOPIC) 2.5 % OP SOLN
1.0000 [drp] | Freq: Three times a day (TID) | OPHTHALMIC | Status: DC
  Filled 2018-05-01: qty 15

## 2018-05-01 MED ORDER — ISOSORBIDE MONONITRATE ER 60 MG PO TB24
60.0000 mg | ORAL_TABLET | Freq: Every day | ORAL | Status: DC
  Administered 2018-05-02: 60 mg via ORAL
  Filled 2018-05-01: qty 1

## 2018-05-01 MED ORDER — CLOPIDOGREL BISULFATE 75 MG PO TABS
75.0000 mg | ORAL_TABLET | Freq: Every day | ORAL | Status: DC
  Administered 2018-05-01: 75 mg via ORAL
  Filled 2018-05-01 (×2): qty 1

## 2018-05-01 MED ORDER — LORATADINE 10 MG PO TABS
10.0000 mg | ORAL_TABLET | Freq: Every day | ORAL | Status: DC
  Administered 2018-05-01 – 2018-05-02 (×2): 10 mg via ORAL
  Filled 2018-05-01 (×2): qty 1

## 2018-05-01 MED ORDER — FLUTICASONE PROPIONATE 50 MCG/ACT NA SUSP
1.0000 | Freq: Every evening | NASAL | Status: DC | PRN
  Filled 2018-05-01: qty 16

## 2018-05-01 MED ORDER — ALBUTEROL SULFATE HFA 108 (90 BASE) MCG/ACT IN AERS
8.0000 | INHALATION_SPRAY | Freq: Once | RESPIRATORY_TRACT | Status: AC
  Administered 2018-05-01: 8 via RESPIRATORY_TRACT
  Filled 2018-05-01: qty 6.7

## 2018-05-01 MED ORDER — LEVOFLOXACIN 750 MG PO TABS
750.0000 mg | ORAL_TABLET | Freq: Every day | ORAL | Status: DC
  Administered 2018-05-02: 750 mg via ORAL
  Filled 2018-05-01: qty 1

## 2018-05-01 MED ORDER — LEVOFLOXACIN IN D5W 750 MG/150ML IV SOLN
750.0000 mg | Freq: Once | INTRAVENOUS | Status: AC
  Administered 2018-05-01: 750 mg via INTRAVENOUS
  Filled 2018-05-01: qty 150

## 2018-05-01 MED ORDER — TORSEMIDE 20 MG PO TABS
40.0000 mg | ORAL_TABLET | Freq: Every day | ORAL | Status: DC
  Administered 2018-05-02: 40 mg via ORAL
  Filled 2018-05-01: qty 2

## 2018-05-01 MED ORDER — AEROCHAMBER PLUS FLO-VU LARGE MISC
Status: AC
  Administered 2018-05-01: 13:00:00
  Filled 2018-05-01: qty 1

## 2018-05-01 MED ORDER — ACETAMINOPHEN 325 MG PO TABS
650.0000 mg | ORAL_TABLET | ORAL | Status: DC | PRN
  Administered 2018-05-01: 650 mg via ORAL
  Filled 2018-05-01: qty 2

## 2018-05-01 MED ORDER — NITROGLYCERIN 0.4 MG SL SUBL: 0.4000 mg | SUBLINGUAL_TABLET | SUBLINGUAL | Status: DC | PRN

## 2018-05-01 MED ORDER — ROSUVASTATIN CALCIUM 20 MG PO TABS
20.0000 mg | ORAL_TABLET | Freq: Every day | ORAL | Status: DC
  Administered 2018-05-01: 20 mg via ORAL
  Filled 2018-05-01: qty 1

## 2018-05-01 MED ORDER — PANTOPRAZOLE SODIUM 40 MG PO TBEC
40.0000 mg | DELAYED_RELEASE_TABLET | Freq: Two times a day (BID) | ORAL | Status: DC
  Administered 2018-05-01 – 2018-05-02 (×2): 40 mg via ORAL
  Filled 2018-05-01 (×2): qty 1

## 2018-05-01 MED ORDER — BISOPROLOL FUMARATE 5 MG PO TABS
5.0000 mg | ORAL_TABLET | Freq: Every day | ORAL | Status: DC
  Administered 2018-05-02: 5 mg via ORAL
  Filled 2018-05-01: qty 1

## 2018-05-01 MED ORDER — COLCHICINE 0.6 MG PO TABS
0.6000 mg | ORAL_TABLET | Freq: Every day | ORAL | Status: DC
  Administered 2018-05-02: 0.6 mg via ORAL
  Filled 2018-05-01: qty 1

## 2018-05-01 MED ORDER — ASPIRIN EC 81 MG PO TBEC
81.0000 mg | DELAYED_RELEASE_TABLET | Freq: Every day | ORAL | Status: DC
  Administered 2018-05-02: 81 mg via ORAL
  Filled 2018-05-01: qty 1

## 2018-05-01 MED ORDER — ALBUTEROL SULFATE (2.5 MG/3ML) 0.083% IN NEBU: 3.0000 mL | INHALATION_SOLUTION | Freq: Four times a day (QID) | RESPIRATORY_TRACT | Status: DC | PRN

## 2018-05-01 MED ORDER — ASPIRIN 81 MG PO CHEW
324.0000 mg | CHEWABLE_TABLET | Freq: Once | ORAL | Status: AC
  Administered 2018-05-01: 240 mg via ORAL
  Filled 2018-05-01: qty 4

## 2018-05-01 MED ORDER — TORSEMIDE 20 MG PO TABS
20.0000 mg | ORAL_TABLET | Freq: Every day | ORAL | Status: DC
  Administered 2018-05-01: 20 mg via ORAL
  Filled 2018-05-01: qty 1

## 2018-05-01 MED ORDER — CYCLOSPORINE 0.05 % OP EMUL
1.0000 [drp] | Freq: Two times a day (BID) | OPHTHALMIC | Status: DC
  Administered 2018-05-01 – 2018-05-02 (×2): 1 [drp] via OPHTHALMIC
  Filled 2018-05-01 (×2): qty 30

## 2018-05-01 MED ORDER — FEBUXOSTAT 40 MG PO TABS
40.0000 mg | ORAL_TABLET | Freq: Every day | ORAL | Status: DC
  Administered 2018-05-02: 40 mg via ORAL
  Filled 2018-05-01: qty 1

## 2018-05-01 NOTE — ED Notes (Signed)
Pt has 81 mg earlier today

## 2018-05-01 NOTE — ED Notes (Signed)
Rep;ort given to rn on 5c 

## 2018-05-01 NOTE — H&P (Signed)
History and Physical  [name] ZOX:[id number] DOB: September 12, 1945 DOA: 05/01/2018  PCP: Darcus Austin, MD (Inactive)   Chief Complaint: SOB  HPI:  [id number] PMH CAD, AICD, asthma, CHF, chronic bronchitis, DM presented with  SOB, CP 1 week, CP radiating to left arm with exertion. EDP suspicious for pneumonia and requests r/o for exertional CP.  Pt has asthma and chronic bronchitis, on 3L [place] at home. Has bad seasonal allergies and lots of pollen in house right now, has nasal congestion which is acute on chronic. Developed productive cough 1 week, inhalers not helping. Has chronic intermittent cough but this is worse. Perhaps a little more SOB than usual. Has been stable on 3L.  Also 2 days of exertional CP, even getting to bathroom today in ER. Aggravated by exertion, relieved with rest.   COVID SCREEN Fever: NO  Cough: yes   SOB: yes URI symptoms: yes GI symptoms: diarrhea a few days ago but this happens intermittently since cholecystectomy, no n/v/abd pain Travel: NO  Sick contacts: has been around grandson with URI last week. Lives with son. 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 AICD (automatic cardioverter/defibrillator) present   . Anemia   . Arthritis   . Asthma   . CAD (coronary artery disease)    a. 10/09/16 LHC: SVG->LAD patent, SVG->Diag patent, SVG->RCA patent, SVG->LCx occluded. EF 60%, b. 10/31/16 LHC DES to AV groove Circ, DES to intermed branch  . Cellulitis and abscess of foot 12/2014   RT FOOT  . CHF (congestive heart failure) ([place])   . Chronic bronchitis ([place])   . Chronic lower back pain   . Diverticulosis   . Facial numbness 02/2016  . Fatty liver disease, nonalcoholic   . Gastritis   . GERD (gastroesophageal reflux disease)    . Gout   . H/O hiatal hernia   . High cholesterol   . Hyperplastic colon polyp   . Hypertension   . IBS (irritable bowel syndrome)   . Internal hemorrhoids   . Ischemic cardiomyopathy   . Obesity   . On home oxygen therapy    "2L at night" (10/31/2016)  . OSA (obstructive sleep apnea)    "can't tolerate a mask" (10/30/2016)  . Pneumonia    "couple times" (10/31/2016)  . Shortness of breath   . TIA (transient ischemic attack)    "recently" (10/31/2016)  . Type II diabetes mellitus ([place])     Past Surgical History:  Procedure Laterality Date  . ABDOMINAL ULTRASOUND  12/01/2011   Peripelvic cysts- #1- 2.4x1.9x2.3cm, #2-1.2x0.9x1.2cm  . ANKLE FRACTURE SURGERY Right    "had rod put in"  . ANTERIOR CERVICAL DECOMP/DISCECTOMY FUSION    . APPENDECTOMY    . BACK SURGERY    . BIOPSY  12/29/2017   Procedure: BIOPSY;  Surgeon: [name], MD;  Location: [facility];  Service: Endoscopy;;  . BIV ICD INSERTION CRT-D  2001?  [name] BIV PACEMAKER GENERATOR CHANGE OUT N/A 11/02/2012   Procedure: BIV PACEMAKER GENERATOR CHANGE OUT;  Surgeon: Sanda Klein, MD;  Location: [facility]ATH [facility];  Service: Cardiovascular;  Laterality: N/A;  . CARDIAC CATHETERIZATION  05/17/1999   No significant coronary obstructive disease w/ mild 20% luminal irregularity of the first diag branch of the  LAD  . CARDIAC CATHETERIZATION  07/08/2002   No significant CAD, moderately depressed LV systolic function  . CARDIAC CATHETERIZATION Bilateral 04/26/2007   Normal findings, recommend medical therapy  . CARDIAC CATHETERIZATION  02/18/2008   Moderate CAD, would benefit from having a functional study, recommend continue medical therapy  . CARDIAC CATHETERIZATION  07/23/2012   Medical therapy  . CARDIAC CATHETERIZATION N/A 11/24/2014   Procedure: Left Heart Cath and Coronary Angiography;  Surgeon: Troy Sine, MD;  Location: [facility] CV [facility];  Service: Cardiovascular;  Laterality: N/A;  . CARDIAC CATHETERIZATION   11/27/2014   Procedure: Intravascular Pressure Wire/FFR Study;  Surgeon: Peter M Martinique, MD;  Location: [facility];  Service: Cardiovascular;;  . CARDIAC CATHETERIZATION  10/09/2016  . CHOLECYSTECTOMY N/A 04/09/2018   Procedure: LAPAROSCOPIC CHOLECYSTECTOMY;  Surgeon: Greer Pickerel, MD;  [facility];  Service: General;  Laterality: N/A;  . COLONOSCOPY WITH PROPOFOL N/A 12/29/2017   Procedure: COLONOSCOPY WITH PROPOFOL;  Surgeon: [name], MD;  [facility];  Service: Endoscopy;  Laterality: N/A;  . CORONARY ANGIOGRAM  2010  . CORONARY ARTERY BYPASS GRAFT N/A 11/29/2014   Procedure: CORONARY ARTERY BYPASS GRAFTING x 5 (LIMA-LAD, SVG-D, SVG-OM1-OM2, SVG-PD);  Surgeon: Melrose Nakayama, MD;  Location: [facility];  Service: Open Heart Surgery;  Laterality: N/A;  . CORONARY STENT INTERVENTION N/A 10/31/2016   Procedure: CORONARY STENT INTERVENTION;  Surgeon: Burnell Blanks, MD;  Location: [place] [facility];  Service: Cardiovascular;  Laterality: N/A;  . ESOPHAGOGASTRODUODENOSCOPY (EGD) WITH PROPOFOL N/A 12/29/2017   Procedure: ESOPHAGOGASTRODUODENOSCOPY (EGD) WITH PROPOFOL;  Surgeon: [name], MD;  [facility];  Service: Endoscopy;  Laterality: N/A;  . FRACTURE SURGERY    . INSERT / REPLACE / [place]  . KNEE ARTHROSCOPY Bilateral   . LEFT HEART CATH AND CORS/GRAFTS ANGIOGRAPHY N/A 12/09/2017   Procedure: LEFT HEART CATH AND CORS/GRAFTS ANGIOGRAPHY;  Surgeon: Troy Sine, MD;  Location: [facility];  Service: Cardiovascular;  Laterality: N/A;  . LEFT HEART CATHETERIZATION WITH CORONARY ANGIOGRAM N/A 07/23/2012   Procedure: LEFT HEART CATHETERIZATION WITH CORONARY ANGIOGRAM;  Surgeon: Leonie Man, MD;  Location[facility] C[facility];  Service: Cardiovascular;  Laterality: N/A;  Carlton Adam MYOVIEW  11/14/2011   Mild-moderate defect seen in Mid Inferolateral and Mid Anterolateral regions-consistant w/ infarct/scar. No  significant ischemia demonstrated.  [name] POLYPECTOMY  12/29/2017   Procedure: POLYPECTOMY;  Surgeon: [name], MD;  [facility];  Service: Endoscopy;;  . RIGHT/LEFT HEART CATH AND CORONARY ANGIOGRAPHY N/A 10/09/2016   Procedure: RIGHT/LEFT HEART CATH AND CORONARY ANGIOGRAPHY;  Surgeon: Jolaine Artist, MD;  Location: [place] [facility] LAB;  Service: Cardiovascular;  Laterality: N/A;  . TEE WITHOUT CARDIOVERSION N/A 11/29/2014   Procedure: TRANSESOPHAGEAL ECHOCARDIOGRAM (TEE);  Surgeon: Melrose Nakayama, MD;  Location: [facility];  Service: Open Heart Surgery;  Laterality: N/A;  . TRANSTHORACIC ECHOCARDIOGRAM  07/23/2012   EF 55-60%, normal-mild  . TUBAL LIGATION       reports that she quit smoking about 50 years ago. Her smoking use included cigarettes. She has a 0.75 pack-year smoking history. She has never used smokeless tobacco. She reports that she does not drink alcohol or use drugs.   Allergies  Allergen Reactions  . Ivp Dye [Iodinated Diagnostic Agents] Shortness Of Breath    No reaction to PO contrast with non-ionic dye.06-25-2014/rsm  . Shellfish Allergy Anaphylaxis  . Shellfish-Derived Products Anaphylaxis  . Sulfa Antibiotics  Shortness Of Breath  . Iodine Hives  . Atorvastatin Other (See Comments)    Pt states "causes bilateral leg pain/cramps."  . Doxycycline Other (See Comments)    Unknown rxn per pt  . Cephalexin Itching and Rash  . Zithromax [Azithromycin] Rash    Family History  Problem Relation Age of Onset  . Breast cancer Mother   . Diabetes Mother   . Heart disease Maternal Grandmother   . Kidney disease Maternal Grandmother   . Diabetes Maternal Grandmother   . Glaucoma Maternal Aunt   . Heart disease Maternal Aunt   . Asthma Sister   . Colon cancer Neg Hx   . Esophageal cancer Neg Hx      Prior to Admission medications   Medication Sig Start Date End Date Taking? Authorizing Provider  acetaminophen (TYLENOL) 500 MG tablet Take 500  mg by mouth daily.    Yes [provider]  albuterol (PROVENTIL HFA;VENTOLIN HFA) 108 (90 Base) MCG/ACT inhaler Inhale 2 puffs into the lungs every 6 (six) hours as needed for wheezing or shortness of breath.   Yes [provider]  albuterol (PROVENTIL) (2.5 MG/3ML) 0.083% nebulizer solution Take 2.5 mg by nebulization every 6 (six) hours as needed for wheezing or shortness of breath.   Yes [provider]  aspirin EC 81 MG tablet Take 1 tablet (81 mg total) by mouth daily. 03/27/16  Yes Kilroy, Luke K, PA-C  bisoprolol (ZEBETA) 5 MG tablet TAKE 1 TABLET EVERY DAY Patient taking differently: Take 5 mg by mouth daily.  11/10/17  Yes Kilroy, Luke K, PA-C  budesonide-formoterol (SYMBICORT) 160-4.5 MCG/ACT inhaler Inhale 2 puffs into the lungs 2 (two) times daily.   Yes [provider]  cetirizine (ZYRTEC) 10 MG tablet Take 10 mg by mouth daily.  03/24/17  Yes [provider]  clopidogrel (PLAVIX) 75 MG tablet Take 1 tablet (75 mg total) by mouth daily. 04/27/18  Yes Croitoru, [name], MD  clotrimazole (LOTRIMIN) 1 % cream Apply 1 application topically 2 (two) times daily. Patient taking differently: Apply 1 application topically 2 (two) times daily as needed (rash).  07/20/17  Yes Lendon Colonel, NP  clotrimazole (MYCELEX) 10 MG troche Take 10 mg by mouth daily as needed (Sore Mouth).    Yes [provider]  colchicine 0.6 MG tablet Take 0.6 mg by mouth daily.    Yes [provider]  diphenhydrAMINE (BENADRYL) 25 MG tablet Take 25 mg by mouth every 6 (six) hours as needed for allergies.   Yes [provider]  docusate sodium (COLACE) 100 MG capsule Take 200 mg by mouth at bedtime.    Yes [provider]  EPINEPHrine (EPIPEN 2-PAK) 0.3 mg/0.3 mL IJ SOAJ injection Inject 0.3 mg into the muscle once as needed (severe allergic reaction).   Yes [provider]  febuxostat (ULORIC) 40 MG tablet Take 40 mg by mouth daily.  11/10/17  Yes [provider]  fluticasone (FLONASE) 50 MCG/ACT nasal spray Place 1 spray into both nostrils at bedtime as needed for rhinitis or allergies.    Yes [provider]  guaiFENesin (MUCINEX) 600 MG 12 hr tablet Take 600 mg by mouth at bedtime as needed for cough or to loosen phlegm.    Yes [provider]  hydrocortisone 2.5 % cream Apply 2.5 application topically daily as needed (itching).  01/08/18  Yes [provider]  hydroxypropyl methylcellulose / hypromellose (ISOPTO TEARS / GONIOVISC) 2.5 % ophthalmic solution Place 1 drop  into both eyes 3 (three) times daily.    Yes [provider]  ipratropium (ATROVENT) 0.02 % nebulizer solution Take 2.5 mLs (0.5 mg total) by nebulization 4 (four) times daily. 09/23/17  Yes Carmon Sails J, PA-C  ipratropium (ATROVENT) 0.06 % nasal spray 1-2 pffs up to 4 x daily as needed Patient taking differently: Place 2 sprays into both nostrils 4 (four) times daily as needed for rhinitis.  10/16/16  Yes Tanda Rockers, MD  isosorbide mononitrate (IMDUR) 60 MG 24 hr tablet Take 1 tablet (60 mg total) by mouth daily. 06/08/17  Yes Croitoru, [name], MD  metolazone (ZAROXOLYN) 5 MG tablet Take 1 tablet (5 mg total) by mouth as needed. Take only as directed by [facility] Patient taking differently: Take 5 mg by mouth daily as needed (for weight gain of 3 LBS. or more).  07/21/16  Yes Eileen Stanford, PA-C  Multiple Vitamins-Minerals (ALIVE WOMENS ENERGY PO) Take 1 tablet by mouth daily.   Yes [provider]  nitroGLYCERIN (NITROSTAT) 0.4 MG SL tablet Place 1 tablet (0.4 mg total) under the tongue every 5 (five) minutes as needed for chest pain. 11/13/16 12/25/18 Yes Kilroy, Luke K, PA-C  ondansetron (ZOFRAN ODT) 4 MG disintegrating tablet Take 1 tablet (4 mg total) by mouth every 8 (eight) hours as needed. Patient taking differently: Take 4 mg by mouth every 8 (eight) hours as needed for nausea or  vomiting.  04/18/17  Yes Isla Pence, MD  OXYGEN Place 2-3 L into the nose See admin instructions. 2 lpm with sleep and 3 with exertion- [facility]   Yes [provider]  pantoprazole (PROTONIX) 40 MG tablet TAKE 1 TABLET EVERY DAY Patient taking differently: Take 40 mg by mouth 2 (two) times daily.  11/25/17  Yes Croitoru, [name], MD  potassium chloride SA (K-DUR,KLOR-CON) 20 MEQ tablet Take 1 tablet (20 mEq total) by mouth daily. 04/27/18  Yes Croitoru, [name], MD  RESTASIS 0.05 % ophthalmic emulsion Place 1 drop into both eyes 2 (two) times daily.  11/30/17  Yes [provider]  rosuvastatin (CRESTOR) 20 MG tablet TAKE 1 TABLET (20 MG TOTAL) BY MOUTH AT BEDTIME. 02/24/18  Yes Croitoru, [name], MD  saxagliptin HCl (ONGLYZA) 2.5 MG TABS tablet Take 2.5 mg by mouth daily.  12/08/17  Yes Almyra Deforest, PA  spironolactone (ALDACTONE) 25 MG tablet TAKE 1 TABLET EVERY DAY Patient taking differently: Take 25 mg by mouth at bedtime.  11/26/16  Yes Croitoru, [name], MD  torsemide (DEMADEX) 20 MG tablet TAKE 40 MG (2TAB) EVERY OTHER DAY ALTERNATING WITH 60 MG EVERY OTHER DAY Patient taking differently: Take 20-40 mg by mouth See admin instructions. Take 40 mg by mouth in the morning and 20 mg in the evening 08/18/17  Yes Bensimhon, Shaune Pascal, MD  triamcinolone cream (KENALOG) 0.1 % Apply 1 application topically 2 (two) times daily as needed (itching).  08/24/14  Yes [provider]  ezetimibe (ZETIA) 10 MG tablet Take 1 tablet (10 mg total) by mouth daily. Patient not taking: Reported on 05/01/2018 07/20/17 12/25/18  Lendon Colonel, NP  famotidine (PEPCID) 40 MG tablet Take 1 tablet (40 mg total) by mouth at bedtime. Patient not taking: Reported on 05/01/2018 01/22/18   Levin Erp, PA  predniSONE (DELTASONE) 10 MG tablet 4 tabs for 2 days, then 3 tabs for 2 days, 2 tabs for 2 days, then 1 tab for 2 days, then stop Patient not taking: Reported on 05/01/2018 04/13/18  Lauraine Rinne, NP   traMADol (ULTRAM) 50 MG tablet Take 1 tablet (50 mg total) by mouth every 6 (six) hours as needed for moderate pain. Patient not taking: Reported on 05/01/2018 03/01/39   Leighton Ruff, MD    Physical Exam: Vitals:   05/01/18 1515 05/01/18 1530  BP: 133/66 129/60  Pulse: 84 81  Resp: 20 16  Temp:    SpO2: 98% 97%    Constitutional:   . Appears calm and comfortable Eyes:  . pupils and irises appear normal . Normal lids ENMT:  . grossly normal hearing  . Lips appear normal Neck:  . neck appears normal, no masses, . no thyromegaly Respiratory:  . Diminished breath sounds bilaterally . Respiratory effort mildly increased Cardiovascular:  . RRR, no m/r/g . No LE extremity edema   Abdomen:  . no tenderness or masses Musculoskeletal:  . Digits/nails BUE: no clubbing, cyanosis, petechiae, infection . RUE, LUE, RLE, LLE   o strength and tone grossly normal, no atrophy, no abnormal movements o No tenderness, masses Skin:  . No rashes, lesions, ulcers . palpation of skin: no induration or nodules Psychiatric:  . Mental status o Mood, affect appropriate . judgment and insight appear intact    I have personally reviewed following labs and imaging studies  Labs:   BMP unremarkable   CBC noted RVP: pending CBC: leukopenia YES, lymphopenia NO   BMP: increased BUN/Cr NO LFTs: increased AST/ALT/Tbili NO   Imaging studies:   CXR: independent review, left base opacity c/w pneumonia   Medical tests:   EKG independently reviewed: v-paced rhythm      Principal Problem:   Chest pain Active Problems:   CKD (chronic kidney disease), stage III (HCC)   Lobar pneumonia (HCC)   Assessment/Plan Exertional chest pain, known CAD, s/p AICD --currently pain free, EKG nondiagnostic, troponin negative --doubt ACS, will r/o MI if negative anticipate d/c home --outpt f/u with cardiology  Left lower lobe pneumonia, asthma --will tx with Levaquin --check RVP in this pt  with chronic bronchitis --continue MDI  Morbid obesity, OSA but CPAP intolerant  CKD stage III, stable  COVID likelihood: LOW, pt has lobar pneumonia, chronic allergies, symptoms most likely from known pulm disease Physician PPE: gloves, SURG MASK, face shield Patient PPE: NONE   COVID Testing: not indicated per current ID/[place] guidelines    Severity of Illness: The appropriate patient status for this patient is OBSERVATION. Observation status is judged to be reasonable and necessary in order to provide the required intensity of service to ensure the patient's safety. The patient's presenting symptoms, physical exam findings, and initial radiographic and laboratory data in the context of their medical condition is felt to place them at decreased risk for further clinical deterioration. Furthermore, it is anticipated that the patient will be medically stable for discharge from the hospital within 2 midnights of admission. The following factors support the patient status of observation.   " The patient's presenting symptoms include chest pain, cough. " The physical exam findings include diminished breath sounds. " The initial radiographic and laboratory data are left lobar pneumonia.    DVT prophylaxis:SCDS Code Status: Full Family Communication: none Consults called: none    Time spent: 60 minutes  Murray Hodgkins, MD  [facility] Direct contact: see [email or web address]  7PM-7AM contact night coverage as below   1. Check the care team in [facility] and look for a) attending/consulting [facility] provider listed and b) the [facility] team listed 2. Log into [email or web address]  and use [place]'s universal password to access. If you do not have the password, please contact the hospital operator. 3. Locate the [facility] provider you are looking for under [facility] and page to a number that you can be directly reached. 4. If you still have difficulty reaching the provider, please page the [facility]  (Director on Call) for the Hospitalists listed on amion for assistance.   05/01/2018, 4:34 PM

## 2018-05-01 NOTE — ED Provider Notes (Signed)
[place] EMERGENCY DEPARTMENT Provider Note   CSN: [id number] Arrival date & time: 05/01/18  1157    History   Chief Complaint Chief Complaint  Patient presents with  . Shortness of Breath  . Chest Pain    HPI [name] is a 73 y.o. female.     HPI   73 year old female with history of CAD, AICD, asthma, CHF, chronic bronchitis, GERD, hyperlipidemia, high cholesterol, TIA, diabetes, OSA, chronic oxygen therapy (3L), who presents to the emergency department today for evaluation of shortness of breath, chest pain and cough for the last week.  Initially symptoms started with rhinorrhea and nasal congestion however that has since resolved.  She now has productive cough with green sputum and shortness of breath.  Has tried her usual inhalers at home without significant relief.  Patient also states she has had intermittent sharp substernal chest pain that intermittently radiates to the left arm.  It usually occurs with exertion.  No pain with inspiration.  Pain is worse with coughing.  Also endorses orthopnea.  Denies increased bilateral lower extremity swelling.  Denies fevers.  Denies recent foreign travel or contacts that have been out of the country recently.  Does report she was around her grandson who had URI eye symptoms last week.  Denies known contact with coded infection.  Patient saw her PCP prior to arrival at [facility] walk-in clinic and was advised to come to the ED as her O2 sats were 93% on her normal 3 L.  Past Medical History:  Diagnosis Date  . AICD (automatic cardioverter/defibrillator) present   . Anemia   . Arthritis   . Asthma   . CAD (coronary artery disease)    a. 10/09/16 LHC: SVG->LAD patent, SVG->Diag patent, SVG->RCA patent, SVG->LCx occluded. EF 60%, b. 10/31/16 LHC DES to AV groove Circ, DES to intermed branch  . Cellulitis and abscess of foot 12/2014   RT FOOT  . CHF (congestive heart failure) ([place])   . Chronic bronchitis ([place])    . Chronic lower back pain   . Diverticulosis   . Facial numbness 02/2016  . Fatty liver disease, nonalcoholic   . Gastritis   . GERD (gastroesophageal reflux disease)   . Gout   . H/O hiatal hernia   . High cholesterol   . Hyperplastic colon polyp   . Hypertension   . IBS (irritable bowel syndrome)   . Internal hemorrhoids   . Ischemic cardiomyopathy   . Obesity   . On home oxygen therapy    "2L at night" (10/31/2016)  . OSA (obstructive sleep apnea)    "can't tolerate a mask" (10/30/2016)  . Pneumonia    "couple times" (10/31/2016)  . Shortness of breath   . TIA (transient ischemic attack)    "recently" (10/31/2016)  . Type II diabetes mellitus [facility])     Patient Active Problem List   Diagnosis Date Noted  . Allergic rhinitis 04/13/2018  . S/P laparoscopic cholecystectomy 04/09/2018  . Chronic respiratory failure with hypoxia and hypercapnia ([place]) 03/07/2018  . Polyp of cecum   . Polyp of transverse colon   . Positive colorectal cancer screening using Cologuard test   . Dysphagia   . Acute on chronic renal failure ([place]) 11/20/2017  . CAD (coronary artery disease) 11/20/2017  . DM2 (diabetes mellitus, type 2) ([place]) 11/20/2017  . Epigastric abdominal pain 11/20/2017  . Vasomotor rhinitis 10/16/2016  . Right facial numbness 03/05/2016  . CKD (chronic kidney disease), stage III ([place])  03/05/2016  . Atrial fibrillation ([place]) 10/24/2015  . Chest pain with moderate risk of acute coronary syndrome 10/23/2015  . History of TIA (transient ischemic attack) 10/22/2015  . Complete heart block ([place]) 06/22/2015  . Allergic drug rash due to anti-infective agent 04/29/2015  . Fatigue 02/14/2015  . Chronic diastolic heart failure ([place]) 02/14/2015  . Cellulitis 12/21/2014  . S/P CABG x 5 11/29/2014  . CAD S/P percutaneous coronary angioplasty   . Diarrhea 07/12/2014  . Generalized abdominal pain 07/12/2014  . Diastolic CHF, acute on chronic (HCC) 11/04/2013  . Mixed  hypercholesterolemia and hypertriglyceridemia 04/22/2013  . Vertigo 04/22/2013  . Dyspnea on exertion 08/25/2012  . Allergic to IV contrast 07/23/2012  . Unstable angina ([place]) 07/22/2012  . Morbid (severe) obesity due to excess calories ([place]) 11/22/2011  . Nonischemic cardiomyopathy ([place]) 11/22/2011  . GERD (gastroesophageal reflux disease) 11/22/2011  . Back pain 11/22/2011  . OSA (obstructive sleep apnea) 11/22/2011  . HEMORRHOIDS-EXTERNAL 02/21/2010  . NAUSEA 02/21/2010  . ABDOMINAL PAIN -GENERALIZED 02/21/2010  . PERSONAL HX COLONIC POLYPS 02/21/2010  . ANEMIA 04/16/2007  . Essential hypertension 04/16/2007  . DIVERTICULOSIS, COLON 04/16/2007  . ARTHRITIS 04/16/2007  . INTERNAL HEMORRHOIDS WITHOUT MENTION COMP 04/12/2007  . Asthma 04/12/2007  . Cardiac resynchronization therapy pacemaker (CRT-P) in place 04/12/2007  . Gastritis without bleeding 12/14/2006  . COLONIC POLYPS, HYPERPLASTIC 09/27/2002  . FATTY LIVER DISEASE 02/16/2002    Past Surgical History:  Procedure Laterality Date  . ABDOMINAL ULTRASOUND  12/01/2011   Peripelvic cysts- #1- 2.4x1.9x2.3cm, #2-1.2x0.9x1.2cm  . ANKLE FRACTURE SURGERY Right    "had rod put in"  . ANTERIOR CERVICAL DECOMP/DISCECTOMY FUSION    . APPENDECTOMY    . BACK SURGERY    . BIOPSY  12/29/2017   Procedure: BIOPSY;  Surgeon: [name], MD;  Location: [facility];  Service: Endoscopy;;  . BIV ICD INSERTION CRT-D  2001?  [name] BIV PACEMAKER GENERATOR CHANGE OUT N/A 11/02/2012   Procedure: BIV PACEMAKER GENERATOR CHANGE OUT;  Surgeon: Sanda Klein, MD;  Location: [place] CATH LAB;  Service: Cardiovascular;  Laterality: N/A;  . CARDIAC CATHETERIZATION  05/17/1999   No significant coronary obstructive disease w/ mild 20% luminal irregularity of the first diag branch of the LAD  . CARDIAC CATHETERIZATION  07/08/2002   No significant CAD, moderately depressed LV systolic function  . CARDIAC CATHETERIZATION Bilateral 04/26/2007   Normal  findings, recommend medical therapy  . CARDIAC CATHETERIZATION  02/18/2008   Moderate CAD, would benefit from having a functional study, recommend continue medical therapy  . CARDIAC CATHETERIZATION  07/23/2012   Medical therapy  . CARDIAC CATHETERIZATION N/A 11/24/2014   Procedure: Left Heart Cath and Coronary Angiography;  Surgeon: Troy Sine, MD;  Location: [place] CV LAB;  Service: Cardiovascular;  Laterality: N/A;  . CARDIAC CATHETERIZATION  11/27/2014   Procedure: Intravascular Pressure Wire/FFR Study;  Surgeon: Peter M Martinique, MD;  Location: [place] CV LAB;  Service: Cardiovascular;;  . CARDIAC CATHETERIZATION  10/09/2016  . CHOLECYSTECTOMY N/A 04/09/2018   Procedure: LAPAROSCOPIC CHOLECYSTECTOMY;  Surgeon: Greer Pickerel, MD;  [facility]: [facility] ORS;  Service: General;  Laterality: N/A;  . COLONOSCOPY WITH PROPOFOL N/A 12/29/2017   Procedure: COLONOSCOPY WITH PROPOFOL;  Surgeon: [name], MD;  [facility];  Service: Endoscopy;  Laterality: N/A;  . CORONARY ANGIOGRAM  2010  . CORONARY ARTERY BYPASS GRAFT N/A 11/29/2014   Procedure: CORONARY ARTERY BYPASS GRAFTING x 5 (LIMA-LAD, SVG-D, SVG-OM1-OM2, SVG-PD);  Surgeon: Melrose Nakayama, MD;  Location: [facility];  Service: Open Heart Surgery;  Laterality: N/A;  . CORONARY STENT INTERVENTION N/A 10/31/2016   Procedure: CORONARY STENT INTERVENTION;  Surgeon: Burnell Blanks, MD;  Location: [place] CV LAB;  Service: Cardiovascular;  Laterality: N/A;  . ESOPHAGOGASTRODUODENOSCOPY (EGD) WITH PROPOFOL N/A 12/29/2017   Procedure: ESOPHAGOGASTRODUODENOSCOPY (EGD) WITH PROPOFOL;  Surgeon: [name], MD;  [facility];  Service: Endoscopy;  Laterality: N/A;  . FRACTURE SURGERY    . INSERT / REPLACE / [place]  . KNEE ARTHROSCOPY Bilateral   . LEFT HEART CATH AND CORS/GRAFTS ANGIOGRAPHY N/A 12/09/2017   Procedure: LEFT HEART CATH AND CORS/GRAFTS ANGIOGRAPHY;  Surgeon: Troy Sine, MD;  Location: [place] CV LAB;  Service: Cardiovascular;  Laterality: N/A;  . LEFT HEART CATHETERIZATION WITH CORONARY ANGIOGRAM N/A 07/23/2012   Procedure: LEFT HEART CATHETERIZATION WITH CORONARY ANGIOGRAM;  Surgeon: Leonie Man, MD;  Location: [facility] CATH LAB;  Service: Cardiovascular;  Laterality: N/A;  Carlton Adam MYOVIEW  11/14/2011   Mild-moderate defect seen in Mid Inferolateral and Mid Anterolateral regions-consistant w/ infarct/scar. No significant ischemia demonstrated.  [name] POLYPECTOMY  12/29/2017   Procedure: POLYPECTOMY;  Surgeon: [name], MD;  [facility];  Service: Endoscopy;;  . RIGHT/LEFT HEART CATH AND CORONARY ANGIOGRAPHY N/A 10/09/2016   Procedure: RIGHT/LEFT HEART CATH AND CORONARY ANGIOGRAPHY;  Surgeon: Jolaine Artist, MD;  Location: [place] CV LAB;  Service: Cardiovascular;  Laterality: N/A;  . TEE WITHOUT CARDIOVERSION N/A 11/29/2014   Procedure: TRANSESOPHAGEAL ECHOCARDIOGRAM (TEE);  Surgeon: Melrose Nakayama, MD;  Location: [place];  Service: Open Heart Surgery;  Laterality: N/A;  . TRANSTHORACIC ECHOCARDIOGRAM  07/23/2012   EF 55-60%, normal-mild  . TUBAL LIGATION       OB History   No obstetric history on file.      Home Medications    Prior to Admission medications   Medication Sig Start Date End Date Taking? Authorizing Provider  acetaminophen (TYLENOL) 500 MG tablet Take 500 mg by mouth daily.    Yes [provider]  albuterol (PROVENTIL HFA;VENTOLIN HFA) 108 (90 Base) MCG/ACT inhaler Inhale 2 puffs into the lungs every 6 (six) hours as needed for wheezing or shortness of breath.   Yes [provider]  albuterol (PROVENTIL) (2.5 MG/3ML) 0.083% nebulizer solution Take 2.5 mg by nebulization every 6 (six) hours as needed for wheezing or shortness of breath.   Yes [provider]  aspirin EC 81 MG tablet Take 1 tablet (81 mg total) by mouth daily. 03/27/16  Yes Kilroy, Luke K, PA-C  bisoprolol  (ZEBETA) 5 MG tablet TAKE 1 TABLET EVERY DAY Patient taking differently: Take 5 mg by mouth daily.  11/10/17  Yes Kilroy, Luke K, PA-C  budesonide-formoterol (SYMBICORT) 160-4.5 MCG/ACT inhaler Inhale 2 puffs into the lungs 2 (two) times daily.   Yes [provider]  cetirizine (ZYRTEC) 10 MG tablet Take 10 mg by mouth daily.  03/24/17  Yes [provider]  clopidogrel (PLAVIX) 75 MG tablet Take 1 tablet (75 mg total) by mouth daily. 04/27/18  Yes Croitoru, [name], MD  clotrimazole (MYCELEX) 10 MG troche Take 10 mg by mouth daily as needed (Sore Mouth).    Yes [provider]  colchicine 0.6 MG tablet Take 0.6 mg by mouth daily.    Yes [provider]  diphenhydrAMINE (BENADRYL) 25 MG tablet Take 25 mg by mouth every 6 (six) hours as needed for allergies.   Yes [provider]  docusate sodium (COLACE) 100 MG capsule Take 200 mg by mouth at bedtime.    Yes [provider]  EPINEPHrine (EPIPEN 2-PAK) 0.3 mg/0.3 mL IJ SOAJ injection Inject 0.3 mg into the muscle once as needed (severe allergic reaction).   Yes [provider]  famotidine (PEPCID) 40 MG tablet Take 1 tablet (40 mg total) by mouth at bedtime. 01/22/18  Yes Levin Erp, PA  febuxostat (ULORIC) 40 MG tablet Take 40 mg by mouth daily. 11/10/17  Yes [provider]  fluticasone (FLONASE) 50 MCG/ACT nasal spray Place 1 spray into both nostrils at bedtime as needed for rhinitis or allergies.    Yes [provider]  guaiFENesin (MUCINEX) 600 MG 12 hr tablet Take 600 mg by mouth at bedtime as needed for cough or to loosen phlegm.    Yes [provider]  hydrocortisone 2.5 % cream Apply 2.5 application topically daily as needed (itching).  01/08/18  Yes [provider]  hydroxypropyl methylcellulose / hypromellose (ISOPTO TEARS / GONIOVISC) 2.5 % ophthalmic solution Place 1 drop into both eyes 3 (three) times daily.    Yes [provider]  isosorbide mononitrate (IMDUR) 60 MG 24 hr tablet Take 1 tablet (60 mg total) by mouth daily. 06/08/17  Yes Croitoru, [name], MD  metolazone (ZAROXOLYN) 5 MG tablet Take 1 tablet (5 mg total) by mouth as needed. Take only as directed by [facility] Failure Clinic Patient taking differently: Take 5 mg by mouth daily as needed (for weight gain of 3 LBS. or more).  07/21/16  Yes Eileen Stanford, PA-C  Multiple Vitamins-Minerals (ALIVE WOMENS ENERGY PO) Take 1 tablet by mouth daily.   Yes [provider]  nitroGLYCERIN (NITROSTAT) 0.4 MG SL tablet Place 1 tablet (0.4 mg total) under the tongue every 5 (five) minutes as needed for chest pain. 11/13/16 12/25/18 Yes Kilroy, Luke K, PA-C  ondansetron (ZOFRAN ODT) 4 MG disintegrating tablet Take 1 tablet (4 mg total) by mouth every 8 (eight) hours as needed. Patient taking differently: Take 4 mg by mouth every 8 (eight) hours as needed for nausea or vomiting.  04/18/17  Yes Isla Pence, MD  OXYGEN Place 2-3 L into the nose See admin instructions. 2 lpm with sleep and 3 with exertion- [facility]   Yes [provider]  pantoprazole (PROTONIX) 40 MG tablet TAKE 1 TABLET EVERY DAY Patient taking differently: Take 40 mg by mouth 2 (two) times daily.  11/25/17  Yes Croitoru, [name], MD  potassium chloride SA (K-DUR,KLOR-CON) 20 MEQ tablet Take 1 tablet (20 mEq total) by mouth daily. 04/27/18  Yes Croitoru, [name], MD  RESTASIS 0.05 % ophthalmic emulsion Place 1 drop into both eyes 2 (two) times daily.  11/30/17  Yes [provider]  rosuvastatin (CRESTOR) 20 MG tablet TAKE 1 TABLET (20 MG TOTAL) BY MOUTH AT BEDTIME. 02/24/18  Yes Croitoru, [name], MD  saxagliptin HCl (ONGLYZA) 2.5 MG TABS tablet Take 2.5 mg by mouth daily.  12/08/17  Yes Almyra Deforest, PA  spironolactone (ALDACTONE) 25 MG tablet TAKE 1 TABLET EVERY DAY Patient taking differently: Take 25 mg by mouth at bedtime.  11/26/16  Yes Croitoru, [name], MD  torsemide (DEMADEX) 20 MG tablet TAKE 40  MG (2TAB) EVERY OTHER DAY ALTERNATING WITH 60 MG EVERY OTHER DAY Patient taking differently: Take 20-40 mg by mouth See admin instructions. Take 40 mg by mouth in the morning and 20 mg in the evening 08/18/17  Yes Bensimhon, Shaune Pascal, MD  triamcinolone cream (KENALOG) 0.1 % Apply 1 application topically 2 (two) times daily as needed (itching).  08/24/14  Yes [provider]  clotrimazole (LOTRIMIN) 1 % cream Apply 1 application topically 2 (two) times daily. Patient taking differently: Apply 1 application topically 2 (two) times daily as needed (rash).  07/20/17   Lendon Colonel, NP  ezetimibe (ZETIA) 10 MG tablet Take 1 tablet (10 mg total) by mouth daily. Patient not taking: Reported on 05/01/2018 07/20/17 12/25/18  Lendon Colonel, NP  ipratropium (ATROVENT) 0.02 % nebulizer solution Take 2.5 mLs (0.5 mg total) by nebulization 4 (four) times daily. 09/23/17   Kinnie Feil, PA-C  ipratropium (ATROVENT) 0.06 % nasal spray 1-2 pffs up to 4 x daily as needed Patient taking differently: Place 2 sprays into both nostrils 4 (four) times daily as needed for rhinitis.  10/16/16   Tanda Rockers, MD  predniSONE (DELTASONE) 10 MG tablet 4 tabs for 2 days, then 3 tabs for 2 days, 2 tabs for 2 days, then 1 tab for 2 days, then stop 04/13/18   Lauraine Rinne, NP  traMADol (ULTRAM) 50 MG tablet Take 1 tablet (50 mg total) by mouth every 6 (six) hours as needed for moderate pain. Patient not taking: Reported on 05/01/2018 07/26/05   Leighton Ruff, MD    Family History Family History  Problem Relation Age of Onset  . Breast cancer Mother   . Diabetes Mother   . Heart disease Maternal Grandmother   . Kidney disease Maternal Grandmother   . Diabetes Maternal Grandmother   . Glaucoma Maternal Aunt   . Heart disease Maternal Aunt   . Asthma Sister   . Colon cancer Neg Hx   . Esophageal cancer Neg Hx     Social History Social History   Tobacco Use  . Smoking status: Former Smoker     Packs/day: 0.25    Years: 3.00    Pack years: 0.75    Types: Cigarettes    Last attempt to quit: 02/11/1968    Years since quitting: 50.2  . Smokeless tobacco: Never Used  Substance Use Topics  . Alcohol use: No  . Drug use: No     Allergies   Ivp dye [iodinated diagnostic agents]; Shellfish allergy; Shellfish-derived products; Sulfa antibiotics; Iodine; Atorvastatin; Doxycycline; Cephalexin; and Zithromax [azithromycin]   Review of Systems Review of Systems  Constitutional: Negative for chills and fever.  HENT: Positive for congestion (resolved) and rhinorrhea (resolved). Negative for ear pain and sore throat.   Eyes: Negative for visual disturbance.  Respiratory: Positive for cough, shortness of breath and wheezing.   Cardiovascular: Positive for chest pain. Negative for palpitations and leg swelling.  Gastrointestinal: Negative for abdominal pain, constipation, diarrhea, nausea and vomiting.  Genitourinary: Positive for flank pain. Negative for dysuria, hematuria and urgency.  Musculoskeletal: Negative for arthralgias and back pain.  Skin: Negative for color change and rash.  Neurological: Negative for headaches.  All other systems reviewed and are negative.  Physical Exam Updated Vital Signs BP 129/60   Pulse 81   Temp 98.3 F (36.8 C) (Oral)   Resp 16   Wt 107.5 kg   SpO2 97%   BMI 38.25 kg/m   Physical Exam Vitals signs and nursing note reviewed.  Constitutional:      General: She is not in acute distress.    Appearance: She is well-developed. She is obese.  HENT:     Head: Normocephalic and atraumatic.  Eyes:  Conjunctiva/sclera: Conjunctivae normal.  Neck:     Musculoskeletal: Neck supple.  Cardiovascular:     Rate and Rhythm: Normal rate and regular rhythm.     Pulses: Normal pulses.     Heart sounds: Normal heart sounds. No murmur.  Pulmonary:     Effort: Pulmonary effort is normal.     Breath sounds: Normal breath sounds.     Comments: End  expiratory wheezing in all lung fields. No rales or rhonchi. No tachypnea. Speaking in full sentences Abdominal:     Palpations: Abdomen is soft.     Tenderness: There is no abdominal tenderness.  Musculoskeletal:     Comments: Trace ble edema  Skin:    General: Skin is warm and dry.  Neurological:     Mental Status: She is alert.     ED Treatments / Results  Labs (all labs ordered are listed, but only abnormal results are displayed) Labs Reviewed  CBC WITH DIFFERENTIAL/PLATELET - Abnormal; Notable for the following components:      Result Value   WBC 2.9 (*)    MCHC 29.2 (*)    Platelets 111 (*)    Lymphs Abs 0.6 (*)    All other components within normal limits  COMPREHENSIVE METABOLIC PANEL - Abnormal; Notable for the following components:   Glucose, Bld 134 (*)    Creatinine, Ser 1.18 (*)    Total Protein 6.0 (*)    Albumin 3.1 (*)    GFR calc non Af Amer 45 (*)    GFR calc Af Amer 53 (*)    All other components within normal limits  BRAIN NATRIURETIC PEPTIDE - Abnormal; Notable for the following components:   B Natriuretic Peptide 142.7 (*)    All other components within normal limits  CBG MONITORING, ED - Abnormal; Notable for the following components:   Glucose-Capillary 139 (*)    All other components within normal limits  I-STAT TROPONIN, ED    EKG EKG Interpretation  Date/Time:  Saturday May 01 2018 12:07:31 EDT Ventricular Rate:  67 PR Interval:    QRS Duration: 171 QT Interval:  471 QTC Calculation: 498 R Axis:   -33 Text Interpretation:  VENTRICULAR PACED RHYTHM Abnormal ekg Confirmed by Carmin Muskrat [phone]) on 05/01/2018 12:14:02 PM   Radiology Dg Chest Portable 1 View  Result Date: 05/01/2018 CLINICAL DATA:  Central left-sided chest pain which shortness-of-breath and dry cough since Monday. EXAM: PORTABLE CHEST 1 VIEW COMPARISON:  04/13/2018 FINDINGS: Right-sided pacemaker unchanged. Lungs are adequately inflated with subtle patchy density  over the periphery of the left mid lung. Possible minimal blunting of the left costophrenic angle. Mild stable cardiomegaly. Remainder of the exam is unchanged. IMPRESSION: Subtle patchy density over the left midlung which may be due to atelectasis or early infection. Possible small amount left pleural fluid. Mild stable cardiomegaly. Electronically Signed   By: Marin Olp M.D.   On: 05/01/2018 12:54    Procedures Procedures (including critical care time)  Medications Ordered in ED Medications  AeroChamber Plus Flo-Vu Large MISC 1 each (has no administration in time range)  albuterol (PROVENTIL HFA;VENTOLIN HFA) 108 (90 Base) MCG/ACT inhaler 8 puff (8 puffs Inhalation Given 05/01/18 1247)  AeroChamber Plus Flo-Vu Large MISC (  Given 05/01/18 1247)  levofloxacin (LEVAQUIN) IVPB 750 mg (0 mg Intravenous Stopped 05/01/18 1615)     Initial Impression / Assessment and Plan / ED Course  I have reviewed the triage vital signs and the nursing notes.  Pertinent labs &  imaging results that were available during my care of the patient were reviewed by me and considered in my medical decision making (see chart for details).     Final Clinical Impressions(s) / ED Diagnoses   Final diagnoses:  Chest pain, unspecified type  Community acquired pneumonia of left lung, unspecified part of lung  Pleural effusion on left   Pt presenting to the ED for cough/sob x1 week. Also with exertional CP and orthopnea. No risk factors for COVID. Afebrile here. Hypertensive but otherwise vitals are reassuring and she is not hypoxic on her normal 3L O2.   CBC with leukopenia, thrombocytopenia, and lymphopenia.  CMP with mildly elevated creatinine at 1.18, otherwise reassuring.  Troponin is negative BNP not significantly elevated  EKG with V-paced rhythm.   CXR With possible small left pleural effusion and with density over the left mid lung concerning for atelectasis vs early infection. Favor infection given  clinical scenario. She was given a dose of levaquin in the ED to cover community acquired PNA. (allergies to ceftriaxone and azithro). She was also given albuterol inhaler which she states did not resolve sxs.   Though patient does have pneumonia which could be contributing to her sob and chest pain, she is describing exertional chest pain that radiates to the LUE. She does have significant known CAD and she is high risk for ACS. Will plan for admission for ACS r/o.   Case was discussed with Dr. Vanita Panda who is in agreement with this plan.   4:17 PM CONSULT with Dr. Sarajane Jews who accepts pt for admission   ED Discharge Orders    None       Bishop Dublin 05/01/18 1618    Carmin Muskrat, MD 05/02/18 [phone]

## 2018-05-01 NOTE — ED Triage Notes (Signed)
Pt here from home with c/o sob and chest pain for one week pt is on 3 liters atc pt is on lasix and has been sleeping in her recliner

## 2018-05-01 NOTE — Progress Notes (Signed)
Pt admitted to unit  Oriented to room, call light and belongings in reach  Assisted pt in ordering dinner  Pt stated she has fallen in the last six months, educated pt on high fall risk  Pt refusing bed alarm  Bed in lowest position  Night shift staff aware

## 2018-05-02 ENCOUNTER — Other Ambulatory Visit: Payer: Self-pay

## 2018-05-02 DIAGNOSIS — D72819 Decreased white blood cell count, unspecified: Secondary | ICD-10-CM

## 2018-05-02 DIAGNOSIS — J9611 Chronic respiratory failure with hypoxia: Secondary | ICD-10-CM | POA: Diagnosis not present

## 2018-05-02 DIAGNOSIS — J123 Human metapneumovirus pneumonia: Secondary | ICD-10-CM | POA: Diagnosis not present

## 2018-05-02 DIAGNOSIS — D696 Thrombocytopenia, unspecified: Secondary | ICD-10-CM

## 2018-05-02 DIAGNOSIS — J189 Pneumonia, unspecified organism: Secondary | ICD-10-CM | POA: Diagnosis not present

## 2018-05-02 DIAGNOSIS — R079 Chest pain, unspecified: Secondary | ICD-10-CM | POA: Diagnosis not present

## 2018-05-02 DIAGNOSIS — Z9049 Acquired absence of other specified parts of digestive tract: Secondary | ICD-10-CM

## 2018-05-02 DIAGNOSIS — I5032 Chronic diastolic (congestive) heart failure: Secondary | ICD-10-CM

## 2018-05-02 DIAGNOSIS — Z9861 Coronary angioplasty status: Secondary | ICD-10-CM

## 2018-05-02 DIAGNOSIS — I2510 Atherosclerotic heart disease of native coronary artery without angina pectoris: Secondary | ICD-10-CM

## 2018-05-02 DIAGNOSIS — J9612 Chronic respiratory failure with hypercapnia: Secondary | ICD-10-CM

## 2018-05-02 LAB — URINALYSIS, ROUTINE W REFLEX MICROSCOPIC
Bilirubin Urine: NEGATIVE
Glucose, UA: NEGATIVE mg/dL
Hgb urine dipstick: NEGATIVE
Ketones, ur: NEGATIVE mg/dL
Nitrite: NEGATIVE
Protein, ur: NEGATIVE mg/dL
Specific Gravity, Urine: 1.009 (ref 1.005–1.030)
pH: 5 (ref 5.0–8.0)

## 2018-05-02 LAB — RESPIRATORY PANEL BY PCR

## 2018-05-02 LAB — EXPECTORATED SPUTUM ASSESSMENT W GRAM STAIN, RFLX TO RESP C

## 2018-05-02 LAB — STREP PNEUMONIAE URINARY ANTIGEN: Strep Pneumo Urinary Antigen: NEGATIVE

## 2018-05-02 LAB — TROPONIN I: Troponin I: <0.03 ng/mL (ref <0.03)

## 2018-05-02 LAB — GLUCOSE, CAPILLARY: Glucose-Capillary: 94 mg/dL (ref 70–99)

## 2018-05-02 MED ORDER — SPIRONOLACTONE 25 MG PO TABS: 25.0000 mg | ORAL_TABLET | Freq: Every day | ORAL | Status: DC

## 2018-05-02 MED ORDER — DICLOFENAC SODIUM 1 % TD GEL
2.0000 g | Freq: Four times a day (QID) | TRANSDERMAL | Status: DC
  Administered 2018-05-02 (×2): 2 g via TOPICAL
  Filled 2018-05-02: qty 100

## 2018-05-02 MED ORDER — CLOTRIMAZOLE 1 % EX CREA: 1.0000 "application " | TOPICAL_CREAM | Freq: Two times a day (BID) | CUTANEOUS | Status: DC | PRN

## 2018-05-02 MED ORDER — AEROCHAMBER PLUS FLO-VU LARGE MISC: 1.0000 | Freq: Once | 0 refills | Status: AC

## 2018-05-02 MED ORDER — METOLAZONE 5 MG PO TABS: 5.0000 mg | ORAL_TABLET | Freq: Every day | ORAL | Status: DC | PRN

## 2018-05-02 MED ORDER — DICLOFENAC SODIUM 1 % TD GEL: 2.0000 g | Freq: Four times a day (QID) | TRANSDERMAL | 0 refills | Status: DC

## 2018-05-02 MED ORDER — ALBUTEROL SULFATE HFA 108 (90 BASE) MCG/ACT IN AERS: 2.0000 | INHALATION_SPRAY | Freq: Four times a day (QID) | RESPIRATORY_TRACT | 0 refills | Status: DC | PRN

## 2018-05-02 MED ORDER — ALBUTEROL SULFATE (2.5 MG/3ML) 0.083% IN NEBU: 2.5000 mg | INHALATION_SOLUTION | Freq: Four times a day (QID) | RESPIRATORY_TRACT | 0 refills | Status: DC | PRN

## 2018-05-02 MED ORDER — INSULIN ASPART 100 UNIT/ML ~~LOC~~ SOLN: 0.0000 [IU] | Freq: Three times a day (TID) | SUBCUTANEOUS | Status: DC

## 2018-05-02 MED ORDER — IPRATROPIUM BROMIDE 0.02 % IN SOLN: 0.5000 mg | Freq: Four times a day (QID) | RESPIRATORY_TRACT | 0 refills | Status: DC

## 2018-05-02 MED ORDER — TORSEMIDE 20 MG PO TABS: 20.0000 mg | ORAL_TABLET | ORAL | Status: DC

## 2018-05-02 MED ORDER — ONDANSETRON 4 MG PO TBDP: 4.0000 mg | ORAL_TABLET | Freq: Three times a day (TID) | ORAL | 0 refills | Status: DC | PRN

## 2018-05-02 MED ORDER — LEVOFLOXACIN 750 MG PO TABS: 750.0000 mg | ORAL_TABLET | Freq: Every day | ORAL | 0 refills | Status: DC

## 2018-05-02 NOTE — Discharge Summary (Signed)
[name], is a 73 y.o. female  DOB December 19, 1945  MRN [id number].  Admission date:  05/01/2018  Admitting Physician  Samuella Cota, MD  Discharge Date:  05/02/2018   Primary MD  Darcus Austin, MD (Inactive)  Recommendations for primary care physician for things to follow:     Discharge Diagnosis  Principal Problem:   Pneumonia due to human metapneumovirus Active Problems:   CAD S/P percutaneous coronary angioplasty   Chronic diastolic CHF (congestive heart failure) ([place])   Chronic respiratory failure with hypoxia and hypercapnia (HCC)   S/P laparoscopic cholecystectomy   Chest pain   Leukopenia   Thrombocytopenia (HCC)      Past Medical History:  Diagnosis Date  . AICD (automatic cardioverter/defibrillator) present   . Anemia   . Arthritis   . Asthma   . CAD (coronary artery disease)    a. 10/09/16 LHC: SVG->LAD patent, SVG->Diag patent, SVG->RCA patent, SVG->LCx occluded. EF 60%, b. 10/31/16 LHC DES to AV groove Circ, DES to intermed branch  . Cellulitis and abscess of foot 12/2014   RT FOOT  . CHF (congestive heart failure) ([place])   . Chronic bronchitis ([place])   . Chronic lower back pain   . Diverticulosis   . Facial numbness 02/2016  . Fatty liver disease, nonalcoholic   . Gastritis   . GERD (gastroesophageal reflux disease)   . Gout   . H/O hiatal hernia   . High cholesterol   . Hyperplastic colon polyp   . Hypertension   . IBS (irritable bowel syndrome)   . Internal hemorrhoids   . Ischemic cardiomyopathy   . Obesity   . On home oxygen therapy    "2L at night" (10/31/2016)  . OSA (obstructive sleep apnea)    "can't tolerate a mask" (10/30/2016)  . Pneumonia    "couple times" (10/31/2016)  . Shortness of breath   . TIA (transient ischemic attack)    "recently" (10/31/2016)  . Type II diabetes  mellitus ([place])     Past Surgical History:  Procedure Laterality Date  . ABDOMINAL ULTRASOUND  12/01/2011   Peripelvic cysts- #1- 2.4x1.9x2.3cm, #2-1.2x0.9x1.2cm  . ANKLE FRACTURE SURGERY Right    "had rod put in"  . ANTERIOR CERVICAL DECOMP/DISCECTOMY FUSION    . APPENDECTOMY    . BACK SURGERY    . BIOPSY  12/29/2017   Procedure: BIOPSY;  Surgeon: [name], MD;  [facility];  Service: Endoscopy;;  . BIV ICD INSERTION CRT-D  2001?  [name] BIV PACEMAKER GENERATOR CHANGE OUT N/A 11/02/2012   Procedure: BIV PACEMAKER GENERATOR CHANGE OUT;  Surgeon: Sanda Klein, MD;  Location: [facility];  Service: Cardiovascular;  Laterality: N/A;  . CARDIAC CATHETERIZATION  05/17/1999   No significant coronary obstructive disease w/ mild 20% luminal irregularity of the first diag branch of the LAD  . CARDIAC CATHETERIZATION  07/08/2002   No significant CAD, moderately depressed LV systolic function  . CARDIAC CATHETERIZATION Bilateral 04/26/2007   Normal findings, recommend medical therapy  .  CARDIAC CATHETERIZATION  02/18/2008   Moderate CAD, would benefit from having a functional study, recommend continue medical therapy  . CARDIAC CATHETERIZATION  07/23/2012   Medical therapy  . CARDIAC CATHETERIZATION N/A 11/24/2014   Procedure: Left Heart Cath and Coronary Angiography;  Surgeon: Troy Sine, MD;  Location: [place] [facility];  Service: Cardiovascular;  Laterality: N/A;  . CARDIAC CATHETERIZATION  11/27/2014   Procedure: Intravascular Pressure Wire/FFR Study;  Surgeon: Peter M Martinique, MD;  Location: [place] CV LAB;  Service: Cardiovascular;;  . CARDIAC CATHETERIZATION  10/09/2016  . CHOLECYSTECTOMY N/A 04/09/2018   Procedure: LAPAROSCOPIC CHOLECYSTECTOMY;  Surgeon: Greer Pickerel, MD;  [facility];  Service: General;  Laterality: N/A;  . COLONOSCOPY WITH PROPOFOL N/A 12/29/2017   Procedure: COLONOSCOPY WITH PROPOFOL;  Surgeon: [name], MD;  [facility];   Service: Endoscopy;  Laterality: N/A;  . CORONARY ANGIOGRAM  2010  . CORONARY ARTERY BYPASS GRAFT N/A 11/29/2014   Procedure: CORONARY ARTERY BYPASS GRAFTING x 5 (LIMA-LAD, SVG-D, SVG-OM1-OM2, SVG-PD);  Surgeon: Melrose Nakayama, MD;  Location: [place] [facility] [place];  Service: Open Heart Surgery;  Laterality: N/A;  . CORONARY STENT INTERVENTION N/A 10/31/2016   Procedure: CORONARY STENT INTERVENTION;  Surgeon: Burnell Blanks, MD;  Location: [place] CV LAB;  Service: Cardiovascular;  Laterality: N/A;  . ESOPHAGOGASTRODUODENOSCOPY (EGD) WITH PROPOFOL N/A 12/29/2017   Procedure: ESOPHAGOGASTRODUODENOSCOPY (EGD) WITH PROPOFOL;  Surgeon: [name], MD;  [facility];  Service: Endoscopy;  Laterality: N/A;  . FRACTURE SURGERY    . INSERT / REPLACE / [place]  . KNEE ARTHROSCOPY Bilateral   . LEFT HEART CATH AND CORS/GRAFTS ANGIOGRAPHY N/A 12/09/2017   Procedure: LEFT HEART CATH AND CORS/GRAFTS ANGIOGRAPHY;  Surgeon: Troy Sine, MD;  Location: [place] [facility];  Service: Cardiovascular;  Laterality: N/A;  . LEFT HEART CATHETERIZATION WITH CORONARY ANGIOGRAM N/A 07/23/2012   Procedure: LEFT HEART CATHETERIZATION WITH CORONARY ANGIOGRAM;  Surgeon: Leonie Man, MD;  Location: [facility];  Service: Cardiovascular;  Laterality: N/A;  Carlton Adam MYOVIEW  11/14/2011   Mild-moderate defect seen in Mid Inferolateral and Mid Anterolateral regions-consistant w/ infarct/scar. No significant ischemia demonstrated.  [name] POLYPECTOMY  12/29/2017   Procedure: POLYPECTOMY;  Surgeon: [name], MD;  [facility];  Service: Endoscopy;;  . RIGHT/LEFT HEART CATH AND CORONARY ANGIOGRAPHY N/A 10/09/2016   Procedure: RIGHT/LEFT HEART CATH AND CORONARY ANGIOGRAPHY;  Surgeon: Jolaine Artist, MD;  Location: [place] CV LAB;  Service: Cardiovascular;  Laterality: N/A;  . TEE WITHOUT CARDIOVERSION N/A 11/29/2014   Procedure: TRANSESOPHAGEAL ECHOCARDIOGRAM (TEE);   Surgeon: Melrose Nakayama, MD;  Location: [place];  Service: Open Heart Surgery;  Laterality: N/A;  . TRANSTHORACIC ECHOCARDIOGRAM  07/23/2012   EF 55-60%, normal-mild  . TUBAL LIGATION         HPI  from the history and physical done on the day of admission:    9yow PMH CAD, AICD, asthma, CHF, chronic bronchitis, DM presented with  SOB, CP 1 week, CP radiating to left arm with exertion. EDP suspicious for pneumonia and requests r/o for exertional CP.  Pt has asthma and chronic bronchitis, on 3L [place] at home. Has bad seasonal allergies and lots of pollen in house right now, has nasal congestion which is acute on chronic. Developed productive cough 1 week, inhalers not helping. Has chronic intermittent cough but this is worse. Perhaps a little more SOB than usual. Has been stable on 3L.  Also 2 days of exertional CP, even getting to bathroom today in ER. Aggravated by exertion, relieved with rest.     Hospital Course:   1.  Pneumonia due to human metapneumovirus: Acute.  Patient presents with complaints of chest discomfort and shortness of breath with exertion. Chest x-ray showing subtle patchy density over the left midlung suspected to be atelectasis versus early infection with a small amount of left pleural fluid noted and stable cardiomegaly.  Started on Levaquin and given MDI.  On hospital day 2, patient reported feeling much improved and denied chest pain respiratory virus panel positive for metapneumovirus.  Patient recommended to continue on Levaquin to complete 5-day course along with continuation of inhaler as needed  2.  Right flank pain: On physical exam question possibility of muscle strain as cause of patient's complaints. Patient was provided with Voltaren gel to apply to the right flank.  3.  Chest pain with h/o CAD s/p AICD: Reports being chest pain-free currently.  Troponin negative. Previous history of CABG and stents. Patient just recently had left heart catheterization  showing severe multivessel coronary artery disease for which medical management was recommended on 11/2017.  Pain symptoms thought to be related with problem #1.  Patient to follow-up in outpatient setting with cardiology needed.   4.  Acute on chronic respiratory failure: Patient on 2 L of nasal cannula oxygen as needed at home.  Breathing treatments given as needed.   5.  Leukopenia: Suspect secondary to respiratory infection.  6.  S/p cholecystectomy: Recommend patient to reschedule outpatient follow-up with surgery    7.  Diastolic congestive heart failure: Clinically patient appears to be euvolemic.  Last EF noted to be 55 to 60%.  8.  Thrombocytopenia: Acute on chronic.  Platelet count noted to be 11 on admission, but patient denies any reports of bleeding.       Consults obtained: None  Discharge Condition: Stable  Diet and Activity recommendation: See Discharge Instructions below   Discharge Instructions    Discharge instructions   Complete by:  As directed    Follow with Primary MD at [place] within 7 to 10 days from discharge.  You were diagnosed with pneumonia secondary to metapneumovirus.  To decrease risks of spreading this virus remember to cover your mouth when coughing,  frequent hand washing, and wearing a mask around others are helpful.  Patients with this virus are usually thought to be noncontagious for 1 to 2 weeks from start of symptoms.  Please complete antibiotic course as prescribed.  Please call and rearrange follow-up with surgery from your gallbladder being removed.  Get CBC and BMP-  checked  by Primary MD at your next follow-up visit ( we routinely change or add medications that can affect your baseline labs and fluid status, therefore we recommend that you get the mentioned basic workup next visit with your PCP, your PCP may decide not to get them or add new tests based on their clinical decision)  Activity: Advance activity as tolerated   Disposition: Home  Diet: Heart Healthy diet     For Heart failure patients - Check your Weight same time everyday, if you gain over 2 pounds, or you develop in leg swelling, experience more shortness of breath or chest pain, call your Primary doctor immediately. Follow Cardiac Low Salt Diet and 1.5 lit/day fluid restriction.  Special Instructions: If you have smoked or chewed Tobacco  in the last 2 yrs please stop smoking, stop any regular Alcohol  and or any Recreational drug use.  On your next visit with your primary care physician please Get Medicines reviewed and adjusted.  Please request your Darcus Austin, MD (Inactive) to go over all Hospital Tests and Procedure/Radiological results at the follow up, please get all Hospital records sent to your Prim MD by signing hospital release before you go home.  If you experience worsening of your admission symptoms, develop shortness of breath, life threatening emergency, suicidal or homicidal thoughts you must seek medical attention immediately by calling 911 or calling your MD immediately  if symptoms less severe.  You Must read complete instructions/literature along with all the possible adverse reactions/side effects for all the Medicines you take and that have been prescribed to you. Take any new Medicines after you have completely understood and accpet all the possible adverse reactions/side effects.   Do not drive, operate heavy machinery, perform activities at heights, swimming or participation in water activities or provide baby sitting services if your were admitted for syncope or siezures until you have seen by Primary MD or a Neurologist and advised to do so again.  Do not drive when taking Pain medications.  Do not take more than prescribed Pain, Sleep and Anxiety Medications  Wear Seat belts while driving.   Please note  You were cared for by a hospitalist during your hospital stay. If you have any questions about your discharge  medications or the care you received while you were in the hospital after you are discharged, you can call the unit and asked to speak with the hospitalist on call if the hospitalist that took care of you is not available. Once you are discharged, your primary care physician will handle any further medical issues. Please note that NO REFILLS for any discharge medications will be authorized once you are discharged, as it is imperative that you return to your primary care physician (or establish a relationship with a primary care physician if you do not have one) for your aftercare needs so that they can reassess your need for medications and monitor your lab values.   Increase activity slowly   Complete by:  As directed         Discharge Medications     Allergies as of 05/02/2018      Reactions   Ivp Dye [iodinated Diagnostic Agents] Shortness Of Breath   No reaction to PO contrast with non-ionic dye.06-25-2014/rsm   Shellfish Allergy Anaphylaxis   Shellfish-derived Products Anaphylaxis   Sulfa Antibiotics Shortness Of Breath   Iodine Hives   Atorvastatin Other (See Comments)   Pt states "causes bilateral leg pain/cramps."   Doxycycline Other (See Comments)   Unknown rxn per pt   Cephalexin Itching, Rash   Zithromax [azithromycin] Rash      Medication List    STOP taking these medications   ezetimibe 10 MG tablet Commonly known as:  ZETIA   famotidine 40 MG tablet Commonly known as:  Pepcid   predniSONE 10 MG tablet Commonly known as:  DELTASONE   traMADol 50 MG tablet Commonly known as:  ULTRAM     TAKE these medications   acetaminophen 500 MG tablet Commonly known as:  TYLENOL Take 500 mg by mouth daily.   albuterol 108 (90 Base) MCG/ACT inhaler Commonly known as:  PROVENTIL HFA;VENTOLIN HFA Inhale 2 puffs into the lungs every 6 (six) hours as needed for wheezing or shortness of breath.   albuterol (2.5 MG/3ML) 0.083% nebulizer solution Commonly known as:  PROVENTIL  Take 3 mLs (2.5 mg total) by nebulization every 6 (six) hours as needed for wheezing or shortness of breath.   ALIVE WOMENS ENERGY PO Take 1 tablet by mouth daily.   aspirin EC 81 MG tablet Take 1 tablet (81 mg total) by mouth daily.   bisoprolol 5 MG tablet Commonly known as:  ZEBETA TAKE 1 TABLET EVERY DAY   budesonide-formoterol 160-4.5 MCG/ACT inhaler Commonly known as:  SYMBICORT Inhale 2 puffs into the lungs 2 (two) times daily.   cetirizine 10 MG tablet Commonly known as:  ZYRTEC Take 10 mg by mouth daily.   clopidogrel 75 MG tablet Commonly known as:  PLAVIX Take 1 tablet (75 mg total) by mouth daily.   clotrimazole 1 % cream Commonly known as:  LOTRIMIN Apply 1 application topically 2 (two) times daily as needed (rash).   clotrimazole 10 MG troche Commonly known as:  MYCELEX Take 10 mg by mouth daily as needed (Sore Mouth).   colchicine 0.6 MG tablet Take 0.6 mg by mouth daily.   diclofenac sodium 1 % Gel Commonly known as:  VOLTAREN Apply 2 g topically 4 (four) times daily.   diphenhydrAMINE 25 MG tablet Commonly known as:  BENADRYL Take 25 mg by mouth every 6 (six) hours as needed for allergies.   docusate sodium 100 MG capsule Commonly known as:  COLACE Take 200 mg by mouth at bedtime.   EpiPen 2-Pak 0.3 mg/0.3 mL Soaj injection Generic drug:  EPINEPHrine Inject 0.3 mg into the muscle once as needed (severe allergic reaction).   febuxostat 40 MG tablet Commonly known as:  ULORIC Take 40 mg by mouth daily.   fluticasone 50 MCG/ACT nasal spray Commonly known as:  FLONASE Place 1 spray into both nostrils at bedtime as needed for rhinitis or allergies.   hydrocortisone 2.5 % cream Apply 2.5 application topically daily as needed (itching).   hydroxypropyl methylcellulose / hypromellose 2.5 % ophthalmic solution Commonly known as:  ISOPTO TEARS / GONIOVISC Place 1 drop into both eyes 3 (three) times daily.   ipratropium 0.02 % nebulizer  solution Commonly known as:  ATROVENT Take 2.5 mLs (0.5 mg total) by nebulization 4 (four) times daily.   ipratropium 0.06 % nasal spray Commonly known as:  Atrovent 1-2 pffs up to 4 x daily as needed What changed:    how much to take  how to take this  when to take this  reasons to take this  additional instructions   isosorbide mononitrate 60 MG 24 hr tablet Commonly known as:  IMDUR Take 1 tablet (60 mg total) by mouth daily.   levofloxacin 750 MG tablet Commonly known as:  LEVAQUIN Take 1 tablet (750 mg total) by mouth daily.   metolazone 5 MG tablet Commonly known as:  ZAROXOLYN Take 1 tablet (5 mg total) by mouth daily as needed (for weight gain of 3 LBS. or more).   Mucinex 600 MG 12 hr tablet Generic drug:  guaiFENesin Take 600 mg by mouth at bedtime as needed for cough or to loosen phlegm.   nitroGLYCERIN 0.4 MG SL tablet Commonly known as:  NITROSTAT Place 1 tablet (0.4 mg total) under the tongue every 5 (five) minutes as needed for chest pain.   ondansetron 4 MG disintegrating tablet Commonly known as:  Zofran ODT Take 1 tablet (4 mg total) by mouth every 8 (eight) hours as needed for nausea or vomiting.   Onglyza 2.5 MG Tabs tablet Generic drug:  saxagliptin HCl Take 2.5 mg by  mouth daily.   OXYGEN Place 2-3 L into the nose See admin instructions. 2 lpm with sleep and 3 with exertion- [facility]   pantoprazole 40 MG tablet Commonly known as:  PROTONIX TAKE 1 TABLET EVERY DAY What changed:  when to take this   potassium chloride SA 20 MEQ tablet Commonly known as:  K-DUR,KLOR-CON Take 1 tablet (20 mEq total) by mouth daily.   Restasis 0.05 % ophthalmic emulsion Generic drug:  cycloSPORINE Place 1 drop into both eyes 2 (two) times daily.   rosuvastatin 20 MG tablet Commonly known as:  CRESTOR TAKE 1 TABLET (20 MG TOTAL) BY MOUTH AT BEDTIME.   spironolactone 25 MG tablet Commonly known as:  ALDACTONE Take 1 tablet (25 mg total) by mouth at  bedtime.   torsemide 20 MG tablet Commonly known as:  DEMADEX Take 1-2 tablets (20-40 mg total) by mouth See admin instructions. Take 40 mg by mouth in the morning and 20 mg in the evening   triamcinolone cream 0.1 % Commonly known as:  KENALOG Apply 1 application topically 2 (two) times daily as needed (itching).     ASK your doctor about these medications   AeroChamber Plus Flo-Vu Large Misc 1 each by Other route once for 1 dose. Ask about: Should I take this medication?       Major procedures and Radiology Reports - PLEASE review detailed and final reports for all details, in brief -     Dg Chest 2 View  Result Date: 04/13/2018 CLINICAL DATA:  Shortness of breath, chest pain EXAM: CHEST - 2 VIEW COMPARISON:  03/10/2017 FINDINGS: Cardiomegaly with right chest multi lead pacer defibrillator. No acute abnormality of the lungs. No focal airspace opacity. Disc degenerative disease and ankylosis of the thoracic spine. IMPRESSION: Cardiomegaly without acute abnormality of the lungs. Electronically Signed   By: Eddie Candle M.D.   On: 04/13/2018 15:43   Dg Chest Portable 1 View  Result Date: 05/01/2018 CLINICAL DATA:  Central left-sided chest pain which shortness-of-breath and dry cough since Monday. EXAM: PORTABLE CHEST 1 VIEW COMPARISON:  04/13/2018 FINDINGS: Right-sided pacemaker unchanged. Lungs are adequately inflated with subtle patchy density over the periphery of the left mid lung. Possible minimal blunting of the left costophrenic angle. Mild stable cardiomegaly. Remainder of the exam is unchanged. IMPRESSION: Subtle patchy density over the left midlung which may be due to atelectasis or early infection. Possible small amount left pleural fluid. Mild stable cardiomegaly. Electronically Signed   By: Marin Olp M.D.   On: 05/01/2018 12:54    Micro Results   Recent Results (from the past 240 hour(s))  Respiratory Panel by PCR     Status: Abnormal   Collection Time: 05/01/18   7:37 PM  Result Value Ref Range Status   Adenovirus NOT DETECTED NOT DETECTED Final   Coronavirus 229E NOT DETECTED NOT DETECTED Final    Comment: (NOTE) The Coronavirus on the Respiratory Panel, DOES NOT test for the novel  Coronavirus (2019 nCoV)    Coronavirus HKU1 NOT DETECTED NOT DETECTED Final   Coronavirus NL63 NOT DETECTED NOT DETECTED Final   Coronavirus OC43 NOT DETECTED NOT DETECTED Final   Metapneumovirus DETECTED (A) NOT DETECTED Final   Rhinovirus / Enterovirus NOT DETECTED NOT DETECTED Final   Influenza A NOT DETECTED NOT DETECTED Final   Influenza B NOT DETECTED NOT DETECTED Final   Parainfluenza Virus 1 NOT DETECTED NOT DETECTED Final   Parainfluenza Virus 2 NOT DETECTED NOT DETECTED Final   Parainfluenza  Virus 3 NOT DETECTED NOT DETECTED Final   Parainfluenza Virus 4 NOT DETECTED NOT DETECTED Final   Respiratory Syncytial Virus NOT DETECTED NOT DETECTED Final   Bordetella pertussis NOT DETECTED NOT DETECTED Final   Chlamydophila pneumoniae NOT DETECTED NOT DETECTED Final   Mycoplasma pneumoniae NOT DETECTED NOT DETECTED Final    Comment: Performed at [facility], [place] [address] [place], [place] [address]  Culture, sputum-assessment     Status: None   Collection Time: 05/02/18  4:59 AM  Result Value Ref Range Status   Specimen Description SPUTUM  Final   Special Requests NONE  Final   Sputum evaluation   Final    Sputum specimen not acceptable for testing.  Please recollect.   RESULT CALLED TO, READ BACK BY AND VERIFIED WITH: WOODS RN AT 0700 ON [id number] BY SJW Performed at [facility], [place] [address] [place], [place] [address]    Report Status 05/02/2018 FINAL  Final       Today   Subjective    Davionne Mastrangelo today states that her chest pain has resolved and that her breathing feels much better and she is ready to go home.  She does report that she has been unable to follow-up with surgery since having her gallbladder removed 3 weeks ago  and has some right flank pain.  Denies any erythema over the incision site.  Objective   Blood pressure (!) 103/58, pulse 71, temperature 97.7 F (36.5 C), temperature source Oral, resp. rate 19, height 5' 5.98" (1.676 m), weight 107.5 kg, SpO2 99 %.   Intake/Output Summary (Last 24 hours) at 05/02/2018 1311 Last data filed at 05/02/2018 1017 Gross per 24 hour  Intake -  Output 1500 ml  Net -1500 ml    Exam  Constitutional: Morbidly obese female NAD, calm, comfortable Eyes: PERRL, lids and conjunctivae normal ENMT: Mucous membranes are moist. Posterior pharynx clear of any exudate or lesions.  Neck: normal, supple, no masses, no thyromegaly Respiratory: Decreased overall aeration with no significant wheezing appreciated.  Patient on 2 L nasal cannula oxygen. Cardiovascular: Regular rate and rhythm, no murmurs / rubs / gallops. No significant lower extremity edema. 2+ pedal pulses. No carotid bruits.  Abdomen: no tenderness, no masses palpated. No hepatosplenomegaly. Bowel sounds positive.  Musculoskeletal: no clubbing / cyanosis. No joint deformity upper and lower extremities. Good ROM, no contractures. Normal muscle tone.  Skin: no rashes, lesions, ulcers. No induration Neurologic: CN 2-12 grossly intact. Sensation intact, DTR normal. Strength 5/5 in all 4.  Psychiatric: Normal judgment and insight. Alert and oriented x 3. Normal mood.    Data Review   CBC w Diff:  Lab Results  Component Value Date   WBC 2.9 (L) 05/01/2018   HGB 12.6 05/01/2018   HGB 12.5 12/03/2017   HCT 43.2 05/01/2018   HCT 39.7 12/03/2017   PLT 111 (L) 05/01/2018   PLT 199 12/03/2017   LYMPHOPCT 20 05/01/2018   MONOPCT 14 05/01/2018   EOSPCT 7 05/01/2018   BASOPCT 1 05/01/2018    CMP:  Lab Results  Component Value Date   NA 138 05/01/2018   NA 143 12/03/2017   K 3.8 05/01/2018   CL 101 05/01/2018   CO2 28 05/01/2018   BUN 18 05/01/2018   BUN 36 (H) 12/03/2017   CREATININE 1.18 (H)  05/01/2018   CREATININE 1.05 04/04/2014   PROT 6.0 (L) 05/01/2018   ALBUMIN 3.1 (L) 05/01/2018   BILITOT 0.5 05/01/2018  ALKPHOS 106 05/01/2018   AST 30 05/01/2018   ALT 35 05/01/2018  .   Total Time in preparing paper work, data evaluation and todays exam - 35 minutes  Norval Morton M.D on 05/02/2018 at 1:11 PM  [facility]   Office  [phone]

## 2018-05-02 NOTE — Progress Notes (Signed)
Pt IV removed, catheter intact  Pt discharged via wheelchair on home oxygen with RN

## 2018-05-02 NOTE — Progress Notes (Signed)
Pt has hx or diabetes, pt states she checks her blood sugar regularly  Paged MD requesting [facility] orders Awaiting response

## 2018-05-02 NOTE — Progress Notes (Signed)
Text on call MD [place] regarding respiratory panel and droplet precautions

## 2018-05-02 NOTE — Progress Notes (Signed)
Pt discharge education provided at bedside  Pt has all belongings, including oxygen  Pt telemetry removed Awaiting transportation for discharge

## 2018-05-02 NOTE — Progress Notes (Signed)
Pt refusing bed alarm Pt has fallen in last 6 months  Bed in lowest position Will continue to monitor

## 2018-05-02 NOTE — Progress Notes (Signed)
Placed new collection container in toilet  Educated pt on needing urine sample  Pt verbalizes understanding

## 2018-05-03 DIAGNOSIS — D696 Thrombocytopenia, unspecified: Secondary | ICD-10-CM | POA: Diagnosis present

## 2018-05-03 DIAGNOSIS — D72819 Decreased white blood cell count, unspecified: Secondary | ICD-10-CM | POA: Diagnosis present

## 2018-05-03 DIAGNOSIS — J4520 Mild intermittent asthma, uncomplicated: Secondary | ICD-10-CM | POA: Diagnosis not present

## 2018-05-03 DIAGNOSIS — I5020 Unspecified systolic (congestive) heart failure: Secondary | ICD-10-CM | POA: Diagnosis not present

## 2018-05-03 DIAGNOSIS — E1122 Type 2 diabetes mellitus with diabetic chronic kidney disease: Secondary | ICD-10-CM | POA: Diagnosis not present

## 2018-05-03 DIAGNOSIS — N183 Chronic kidney disease, stage 3 (moderate): Secondary | ICD-10-CM | POA: Diagnosis not present

## 2018-05-03 DIAGNOSIS — I2510 Atherosclerotic heart disease of native coronary artery without angina pectoris: Secondary | ICD-10-CM | POA: Diagnosis not present

## 2018-05-03 DIAGNOSIS — I5033 Acute on chronic diastolic (congestive) heart failure: Secondary | ICD-10-CM | POA: Diagnosis not present

## 2018-05-03 DIAGNOSIS — I10 Essential (primary) hypertension: Secondary | ICD-10-CM | POA: Diagnosis not present

## 2018-05-03 DIAGNOSIS — E1169 Type 2 diabetes mellitus with other specified complication: Secondary | ICD-10-CM | POA: Diagnosis not present

## 2018-05-04 DIAGNOSIS — I10 Essential (primary) hypertension: Secondary | ICD-10-CM | POA: Diagnosis not present

## 2018-05-04 DIAGNOSIS — D72819 Decreased white blood cell count, unspecified: Secondary | ICD-10-CM | POA: Diagnosis not present

## 2018-05-04 DIAGNOSIS — J189 Pneumonia, unspecified organism: Secondary | ICD-10-CM | POA: Diagnosis not present

## 2018-05-04 DIAGNOSIS — E1122 Type 2 diabetes mellitus with diabetic chronic kidney disease: Secondary | ICD-10-CM | POA: Diagnosis not present

## 2018-05-04 DIAGNOSIS — I5032 Chronic diastolic (congestive) heart failure: Secondary | ICD-10-CM | POA: Diagnosis not present

## 2018-05-04 DIAGNOSIS — Z79899 Other long term (current) drug therapy: Secondary | ICD-10-CM | POA: Diagnosis not present

## 2018-05-04 DIAGNOSIS — J9610 Chronic respiratory failure, unspecified whether with hypoxia or hypercapnia: Secondary | ICD-10-CM | POA: Diagnosis not present

## 2018-05-04 DIAGNOSIS — D696 Thrombocytopenia, unspecified: Secondary | ICD-10-CM | POA: Diagnosis not present

## 2018-05-06 DIAGNOSIS — R269 Unspecified abnormalities of gait and mobility: Secondary | ICD-10-CM | POA: Diagnosis not present

## 2018-05-06 DIAGNOSIS — I5032 Chronic diastolic (congestive) heart failure: Secondary | ICD-10-CM | POA: Diagnosis not present

## 2018-05-06 DIAGNOSIS — R062 Wheezing: Secondary | ICD-10-CM | POA: Diagnosis not present

## 2018-05-06 DIAGNOSIS — J9611 Chronic respiratory failure with hypoxia: Secondary | ICD-10-CM | POA: Diagnosis not present

## 2018-05-10 DIAGNOSIS — R195 Other fecal abnormalities: Secondary | ICD-10-CM | POA: Diagnosis not present

## 2018-05-10 DIAGNOSIS — J988 Other specified respiratory disorders: Secondary | ICD-10-CM | POA: Diagnosis not present

## 2018-05-11 ENCOUNTER — Ambulatory Visit: Payer: Medicare HMO | Admitting: Internal Medicine

## 2018-05-18 ENCOUNTER — Other Ambulatory Visit: Payer: Self-pay

## 2018-05-18 ENCOUNTER — Ambulatory Visit (INDEPENDENT_AMBULATORY_CARE_PROVIDER_SITE_OTHER): Payer: Medicare HMO | Admitting: *Deleted

## 2018-05-18 DIAGNOSIS — I442 Atrioventricular block, complete: Secondary | ICD-10-CM | POA: Diagnosis not present

## 2018-05-19 LAB — CUP PACEART REMOTE DEVICE CHECK
Battery Remaining Longevity: 66 mo
Battery Remaining Percentage: 91 %
Brady Statistic RA Percent Paced: 0 %
Brady Statistic RV Percent Paced: 99 %
Date Time Interrogation Session: 20200407115300
Implantable Lead Implant Date: 20080923
Implantable Lead Implant Date: 20080923
Implantable Lead Implant Date: 20080923
Implantable Lead Location: 753858
Implantable Lead Location: 753859
Implantable Lead Location: 753860
Implantable Lead Model: 4285
Implantable Lead Model: 4555
Implantable Lead Model: 7120
Implantable Lead Serial Number: 170220
Implantable Lead Serial Number: 486468
Implantable Pulse Generator Implant Date: 20140923
Lead Channel Impedance Value: 1161 Ohm
Lead Channel Impedance Value: 682 Ohm
Lead Channel Impedance Value: 796 Ohm
Lead Channel Pacing Threshold Amplitude: 0.8 V
Lead Channel Pacing Threshold Amplitude: 1.2 V
Lead Channel Pacing Threshold Pulse Width: 0.4 ms
Lead Channel Pacing Threshold Pulse Width: 0.8 ms
Lead Channel Setting Pacing Amplitude: 1.9 V
Lead Channel Setting Pacing Amplitude: 2 V
Lead Channel Setting Pacing Pulse Width: 0.4 ms
Lead Channel Setting Pacing Pulse Width: 0.8 ms
Lead Channel Setting Sensing Sensitivity: 2.5 mV
Lead Channel Setting Sensing Sensitivity: 2.5 mV
Pulse Gen Serial Number: 100731

## 2018-05-27 ENCOUNTER — Encounter: Payer: Self-pay | Admitting: Cardiology

## 2018-05-27 NOTE — Progress Notes (Signed)
Remote pacemaker transmission.   

## 2018-05-31 DIAGNOSIS — R269 Unspecified abnormalities of gait and mobility: Secondary | ICD-10-CM | POA: Diagnosis not present

## 2018-05-31 DIAGNOSIS — J4530 Mild persistent asthma, uncomplicated: Secondary | ICD-10-CM | POA: Diagnosis not present

## 2018-05-31 DIAGNOSIS — I5032 Chronic diastolic (congestive) heart failure: Secondary | ICD-10-CM | POA: Diagnosis not present

## 2018-05-31 DIAGNOSIS — J4520 Mild intermittent asthma, uncomplicated: Secondary | ICD-10-CM | POA: Diagnosis not present

## 2018-06-06 DIAGNOSIS — I5032 Chronic diastolic (congestive) heart failure: Secondary | ICD-10-CM | POA: Diagnosis not present

## 2018-06-06 DIAGNOSIS — J9611 Chronic respiratory failure with hypoxia: Secondary | ICD-10-CM | POA: Diagnosis not present

## 2018-06-06 DIAGNOSIS — R062 Wheezing: Secondary | ICD-10-CM | POA: Diagnosis not present

## 2018-06-06 DIAGNOSIS — R269 Unspecified abnormalities of gait and mobility: Secondary | ICD-10-CM | POA: Diagnosis not present

## 2018-06-07 DIAGNOSIS — N183 Chronic kidney disease, stage 3 (moderate): Secondary | ICD-10-CM | POA: Diagnosis not present

## 2018-06-07 DIAGNOSIS — L309 Dermatitis, unspecified: Secondary | ICD-10-CM | POA: Diagnosis not present

## 2018-06-07 DIAGNOSIS — M109 Gout, unspecified: Secondary | ICD-10-CM | POA: Diagnosis not present

## 2018-06-07 DIAGNOSIS — L659 Nonscarring hair loss, unspecified: Secondary | ICD-10-CM | POA: Diagnosis not present

## 2018-06-07 DIAGNOSIS — M79605 Pain in left leg: Secondary | ICD-10-CM | POA: Diagnosis not present

## 2018-06-08 DIAGNOSIS — M79605 Pain in left leg: Secondary | ICD-10-CM | POA: Diagnosis not present

## 2018-06-08 DIAGNOSIS — M7989 Other specified soft tissue disorders: Secondary | ICD-10-CM | POA: Diagnosis not present

## 2018-06-09 ENCOUNTER — Other Ambulatory Visit: Payer: Self-pay

## 2018-06-09 ENCOUNTER — Encounter (INDEPENDENT_AMBULATORY_CARE_PROVIDER_SITE_OTHER): Payer: Self-pay | Admitting: Orthopaedic Surgery

## 2018-06-09 ENCOUNTER — Ambulatory Visit (INDEPENDENT_AMBULATORY_CARE_PROVIDER_SITE_OTHER): Payer: Medicare HMO | Admitting: Orthopaedic Surgery

## 2018-06-09 VITALS — Ht 65.0 in | Wt 237.0 lb

## 2018-06-09 DIAGNOSIS — M25532 Pain in left wrist: Secondary | ICD-10-CM | POA: Diagnosis not present

## 2018-06-09 NOTE — Progress Notes (Signed)
Office Visit Note   Patient: [name]           Date of Birth: [birth year]           MRN: [id number] Visit Date: 06/09/2018              Requested by: [facility] [address] [place], [place] [address] PCP: [facility]   Assessment & Plan: Visit Diagnoses:  1. Pain in left wrist     Plan: Patient has old left wrist scapholunate ligament injury with triquetral fracture which did not heal.  Recent fall in February with some continued soreness.  No evidence of acute gout in a wrist or foot at this time.  I congratulated her on her weight loss continued encouragement for diabetic diet following and continue hypertension and cardiac diet compliance encouraged.  She can return if she has increased symptoms.  Follow-Up Instructions: Return if symptoms worsen or fail to improve.   Orders:  No orders of the defined types were placed in this encounter.  No orders of the defined types were placed in this encounter.     Procedures: No procedures performed   Clinical Data: No additional findings.   Subjective: Chief Complaint  Patient presents with   Left Ankle - Pain   Left Foot - Pain   Left Wrist - Pain    HPI 73 year old female long-term patient of mine returns states she had a fall in February before she had her gallbladder surgery March 2 states she injured her left hand again.  Previous x-rays 2016 and then repeat images after her fall on 04/01/2018 demonstrated scapholunate separation with chronic triquetral fracture and mild basilar thumb joint osteoarthritis.  No acute changes were noted and distal radius was intact.  She noted some ecchymosis over her wrist dorsally.  She states she knows she bruises easily.  She is lost about 20 more pounds she does have history of gout and was put on Uloric originally which was too expensive and now she is on colchicine.  Patient has a pacemaker sleep apnea long  history of morbid obesity and the first time I have seen her with a BMI under 40.  She is also had some discomfort with her left ankle without acute swelling.  She is able to wear shoes is been ambulatory.  She is used Tylenol with slight relief.  She has history of heart failure cannot take anti-inflammatories.  Previous CABG x5 previous angioplasty.  Fatty liver, diverticulosis.  Review of Systems 14 point update unchanged from 09/30/2016 other than as mentioned in HPI above.  Last A1c February was 7.0.   Objective: Vital Signs: Ht 5\' 5"  (1.651 m)    Wt 237 lb (107.5 kg)    BMI 39.44 kg/m   Physical Exam Constitutional:      Appearance: She is well-developed.  HENT:     Head: Normocephalic.     Right Ear: External ear normal.     Left Ear: External ear normal.  Eyes:     Pupils: Pupils are equal, round, and reactive to light.  Neck:     Thyroid: No thyromegaly.     Trachea: No tracheal deviation.  Cardiovascular:     Rate and Rhythm: Normal rate.  Pulmonary:     Effort: Pulmonary effort is normal.  Abdominal:     Palpations: Abdomen is soft.  Skin:    General: Skin is warm and dry.  Neurological:  Mental Status: She is alert and oriented to person, place, and time.  Psychiatric:        Behavior: Behavior normal.     Ortho Exam patient has 60% wrist flexion extension on the left versus right wrist some tenderness to the base of the thumb with palpable osteophytes.  Negative Finkelstein test.  Tenderness over the scapholunate interval.  No ulnar styloid tenderness.  She is tender over the triquetral bone with palpation of the left wrist.  No effusion of the left ankle.  Patient has some palpable prominence dorsal osteophytes over the midfoot.  No definite ganglion noted normal midfoot motion normal ankle and subtalar motion.  No plantar foot lesions.  Specialty Comments:  No specialty comments available.  Imaging: CLINICAL DATA:  73 y/o  F; fall 1 week ago with persistent  pain.  EXAM: LEFT WRIST - COMPLETE 3+ VIEW  COMPARISON:  10/19/2014 left wrist radiograph  FINDINGS: Scapholunate separation. Chronic triquetrum fracture. No additional fracture identified. Osteoarthrosis of the basal joint with loss of the joint space, articular surface sclerosis, and osteophytosis.  IMPRESSION: 1. Scapholunate separation. 2. Chronic triquetrum fracture. 3. Mild basal joint osteoarthrosis.   Electronically Signed   By: Kristine Garbe M.D.   On: 04/01/2018 20:18 Images reviewed with pt and report.  PMFS History: Patient Active Problem List   Diagnosis Date Noted   Leukopenia 05/03/2018   Thrombocytopenia ([place]) 05/03/2018   Pneumonia due to human metapneumovirus 05/02/2018   Chest pain 05/01/2018   Allergic rhinitis 04/13/2018   S/P laparoscopic cholecystectomy 04/09/2018   Chronic respiratory failure with hypoxia and hypercapnia ([place]) 03/07/2018   Polyp of cecum    Polyp of transverse colon    Positive colorectal cancer screening using Cologuard test    Dysphagia    Acute on chronic renal failure ([place]) 11/20/2017   CAD (coronary artery disease) 11/20/2017   DM2 (diabetes mellitus, type 2) ([place]) 11/20/2017   Epigastric abdominal pain 11/20/2017   Vasomotor rhinitis 10/16/2016   Right facial numbness 03/05/2016   CKD (chronic kidney disease), stage III ([place]) 03/05/2016   Atrial fibrillation ([place]) 10/24/2015   Chest pain with moderate risk of acute coronary syndrome 10/23/2015   History of TIA (transient ischemic attack) 10/22/2015   Complete heart block ([place]) 06/22/2015   Allergic drug rash due to anti-infective agent 04/29/2015   Fatigue 02/14/2015   Chronic diastolic CHF (congestive heart failure) ([place]) 02/14/2015   Cellulitis 12/21/2014   S/P CABG x 5 11/29/2014   CAD S/P percutaneous coronary angioplasty    Diarrhea 07/12/2014   Generalized abdominal pain 62/94/7654   Diastolic CHF, acute on  chronic ([place]) 11/04/2013   Mixed hypercholesterolemia and hypertriglyceridemia 04/22/2013   Vertigo 04/22/2013   Dyspnea on exertion 08/25/2012   Allergic to IV contrast 07/23/2012   Unstable angina ([place]) 07/22/2012   Morbid (severe) obesity due to excess calories ([place]) 11/22/2011   Nonischemic cardiomyopathy ([place]) 11/22/2011   GERD (gastroesophageal reflux disease) 11/22/2011   Back pain 11/22/2011   OSA (obstructive sleep apnea) 11/22/2011   HEMORRHOIDS-EXTERNAL 02/21/2010   NAUSEA 02/21/2010   ABDOMINAL PAIN -GENERALIZED 02/21/2010   PERSONAL HX COLONIC POLYPS 02/21/2010   ANEMIA 04/16/2007   Essential hypertension 04/16/2007   DIVERTICULOSIS, COLON 04/16/2007   ARTHRITIS 04/16/2007   INTERNAL HEMORRHOIDS WITHOUT MENTION COMP 04/12/2007   Asthma 04/12/2007   Cardiac resynchronization therapy pacemaker (CRT-P) in place 04/12/2007   Gastritis without bleeding 12/14/2006   COLONIC POLYPS, HYPERPLASTIC 09/27/2002   FATTY LIVER DISEASE 02/16/2002  Past Medical History:  Diagnosis Date   AICD (automatic cardioverter/defibrillator) present    Anemia    Arthritis    Asthma    CAD (coronary artery disease)    a. 10/09/16 LHC: SVG->LAD patent, SVG->Diag patent, SVG->RCA patent, SVG->LCx occluded. EF 60%, b. 10/31/16 LHC DES to AV groove Circ, DES to intermed branch   Cellulitis and abscess of foot 12/2014   RT FOOT   CHF (congestive heart failure) (H[facility])    Chronic bronchitis ([facility])    Chronic lower back pain    Diverticulosis    Facial numbness 02/2016   Fatty liver disease, nonalcoholic    Gastritis    GERD (gastroesophageal reflux disease)    Gout    H/O hiatal hernia    High cholesterol    Hyperplastic colon polyp    Hypertension    IBS (irritable bowel syndrome)    Internal hemorrhoids    Ischemic cardiomyopathy    Obesity    On home oxygen therapy    "2L at night" (10/31/2016)   OSA (obstructive sleep apnea)    "can't  tolerate a mask" (10/30/2016)   Pneumonia    "couple times" (10/31/2016)   Shortness of breath    TIA (transient ischemic attack)    "recently" (10/31/2016)   Type II diabetes mellitus ([place])     Family History  Problem Relation Age of Onset   Breast cancer Mother    Diabetes Mother    Heart disease Maternal Grandmother    Kidney disease Maternal Grandmother    Diabetes Maternal Grandmother    Glaucoma Maternal Aunt    Heart disease Maternal Aunt    Asthma Sister    Colon cancer Neg Hx    Esophageal cancer Neg Hx     Past Surgical History:  Procedure Laterality Date   ABDOMINAL ULTRASOUND  12/01/2011   Peripelvic cysts- #1- 2.4x1.9x2.3cm, #2-1.2x0.9x1.2cm   ANKLE FRACTURE SURGERY Right    "had rod put in"   ANTERIOR CERVICAL DECOMP/DISCECTOMY FUSION     APPENDECTOMY     BACK SURGERY     BIOPSY  12/29/2017   Procedure: BIOPSY;  Surgeon: [name], MD;  Location: [facility];  Service: Endoscopy;;   BIV ICD INSERTION CRT-D  2001?   BIV PACEMAKER GENERATOR CHANGE OUT N/A 11/02/2012   Procedure: BIV PACEMAKER GENERATOR CHANGE OUT;  Surgeon: Sanda Klein, MD;  Location: [facility];  Service: Cardiovascular;  Laterality: N/A;   CARDIAC CATHETERIZATION  05/17/1999   No significant coronary obstructive disease w/ mild 20% luminal irregularity of the first diag branch of the LAD   CARDIAC CATHETERIZATION  07/08/2002   No significant CAD, moderately depressed LV systolic function   CARDIAC CATHETERIZATION Bilateral 04/26/2007   Normal findings, recommend medical therapy   CARDIAC CATHETERIZATION  02/18/2008   Moderate CAD, would benefit from having a functional study, recommend continue medical therapy   CARDIAC CATHETERIZATION  07/23/2012   Medical therapy   CARDIAC CATHETERIZATION N/A 11/24/2014   Procedure: Left Heart Cath and Coronary Angiography;  Surgeon: Troy Sine, MD;  Location: [facility];  Service: Cardiovascular;  Laterality:  N/A;   CARDIAC CATHETERIZATION  11/27/2014   Procedure: Intravascular Pressure Wire/FFR Study;  Surgeon: Peter M Martinique, MD;  Location: [facility];  Service: Cardiovascular;;   CARDIAC CATHETERIZATION  10/09/2016   CHOLECYSTECTOMY N/A 04/09/2018   Procedure: LAPAROSCOPIC CHOLECYSTECTOMY;  Surgeon: Greer Pickerel, MD;  Location: [facility];  Service: General;  Laterality: N/A;  COLONOSCOPY WITH PROPOFOL N/A 12/29/2017   Procedure: COLONOSCOPY WITH PROPOFOL;  Surgeon: [name], MD;  Location: [facility];  Service: Endoscopy;  Laterality: N/A;   CORONARY ANGIOGRAM  2010   CORONARY ARTERY BYPASS GRAFT N/A 11/29/2014   Procedure: CORONARY ARTERY BYPASS GRAFTING x 5 (LIMA-LAD, SVG-D, SVG-OM1-OM2, SVG-PD);  Surgeon: Melrose Nakayama, MD;  Location: [facility];  Service: Open Heart Surgery;  Laterality: N/A;   CORONARY STENT INTERVENTION N/A 10/31/2016   Procedure: CORONARY STENT INTERVENTION;  Surgeon: Burnell Blanks, MD;  Location: [facility];  Service: Cardiovascular;  Laterality: N/A;   ESOPHAGOGASTRODUODENOSCOPY (EGD) WITH PROPOFOL N/A 12/29/2017   Procedure: ESOPHAGOGASTRODUODENOSCOPY (EGD) WITH PROPOFOL;  Surgeon: [name], MD;  [facility];  Service: Endoscopy;  Laterality: N/A;   FRACTURE SURGERY     INSERT / REPLACE / [place] ARTHROSCOPY Bilateral    LEFT HEART CATH AND CORS/GRAFTS ANGIOGRAPHY N/A 12/09/2017   Procedure: LEFT HEART CATH AND CORS/GRAFTS ANGIOGRAPHY;  Surgeon: Troy Sine, MD;  Location: [facility];  Service: Cardiovascular;  Laterality: N/A;   LEFT HEART CATHETERIZATION WITH CORONARY ANGIOGRAM N/A 07/23/2012   Procedure: LEFT HEART CATHETERIZATION WITH CORONARY ANGIOGRAM;  Surgeon: Leonie Man, MD;  Location: [facility]ATH [facility];  Service: Cardiovascular;  Laterality: N/A;   LEXISCAN MYOVIEW  11/14/2011   Mild-moderate defect seen in Mid Inferolateral and Mid Anterolateral  regions-consistant w/ infarct/scar. No significant ischemia demonstrated.   POLYPECTOMY  12/29/2017   Procedure: POLYPECTOMY;  Surgeon: [name], MD;  [facility];  Service: Endoscopy;;   RIGHT/LEFT HEART CATH AND CORONARY ANGIOGRAPHY N/A 10/09/2016   Procedure: RIGHT/LEFT HEART CATH AND CORONARY ANGIOGRAPHY;  Surgeon: Jolaine Artist, MD;  Location: [facility];  Service: Cardiovascular;  Laterality: N/A;   TEE WITHOUT CARDIOVERSION N/A 11/29/2014   Procedure: TRANSESOPHAGEAL ECHOCARDIOGRAM (TEE);  Surgeon: Melrose Nakayama, MD;  Location: [facility];  Service: Open Heart Surgery;  Laterality: N/A;   TRANSTHORACIC ECHOCARDIOGRAM  07/23/2012   EF 55-60%, normal-mild   TUBAL LIGATION     Social History   Occupational History   Occupation: STAFF/BUFFET    Employer: [employer]  Tobacco Use   Smoking status: Former Smoker    Packs/day: 0.25    Years: 3.00    Pack years: 0.75    Types: Cigarettes    Last attempt to quit: 02/11/1968    Years since quitting: 50.3   Smokeless tobacco: Never Used  Substance and Sexual Activity   Alcohol use: No   Drug use: No   Sexual activity: Never

## 2018-06-14 DIAGNOSIS — M79672 Pain in left foot: Secondary | ICD-10-CM | POA: Diagnosis not present

## 2018-06-14 DIAGNOSIS — M10072 Idiopathic gout, left ankle and foot: Secondary | ICD-10-CM | POA: Diagnosis not present

## 2018-06-14 DIAGNOSIS — M25572 Pain in left ankle and joints of left foot: Secondary | ICD-10-CM | POA: Diagnosis not present

## 2018-06-14 DIAGNOSIS — M1009 Idiopathic gout, multiple sites: Secondary | ICD-10-CM | POA: Diagnosis not present

## 2018-06-14 DIAGNOSIS — I10 Essential (primary) hypertension: Secondary | ICD-10-CM | POA: Diagnosis not present

## 2018-06-14 DIAGNOSIS — Z8679 Personal history of other diseases of the circulatory system: Secondary | ICD-10-CM | POA: Diagnosis not present

## 2018-06-14 DIAGNOSIS — N189 Chronic kidney disease, unspecified: Secondary | ICD-10-CM | POA: Diagnosis not present

## 2018-06-14 DIAGNOSIS — M19072 Primary osteoarthritis, left ankle and foot: Secondary | ICD-10-CM | POA: Diagnosis not present

## 2018-06-14 DIAGNOSIS — M109 Gout, unspecified: Secondary | ICD-10-CM | POA: Diagnosis not present

## 2018-06-21 DIAGNOSIS — L821 Other seborrheic keratosis: Secondary | ICD-10-CM | POA: Diagnosis not present

## 2018-06-21 DIAGNOSIS — L218 Other seborrheic dermatitis: Secondary | ICD-10-CM | POA: Diagnosis not present

## 2018-06-26 ENCOUNTER — Other Ambulatory Visit: Payer: Self-pay | Admitting: Cardiovascular Disease

## 2018-06-30 DIAGNOSIS — J4530 Mild persistent asthma, uncomplicated: Secondary | ICD-10-CM | POA: Diagnosis not present

## 2018-06-30 DIAGNOSIS — R269 Unspecified abnormalities of gait and mobility: Secondary | ICD-10-CM | POA: Diagnosis not present

## 2018-06-30 DIAGNOSIS — J4520 Mild intermittent asthma, uncomplicated: Secondary | ICD-10-CM | POA: Diagnosis not present

## 2018-06-30 DIAGNOSIS — I5032 Chronic diastolic (congestive) heart failure: Secondary | ICD-10-CM | POA: Diagnosis not present

## 2018-07-06 ENCOUNTER — Other Ambulatory Visit: Payer: Self-pay | Admitting: Cardiovascular Disease

## 2018-07-06 DIAGNOSIS — J9611 Chronic respiratory failure with hypoxia: Secondary | ICD-10-CM | POA: Diagnosis not present

## 2018-07-06 DIAGNOSIS — R062 Wheezing: Secondary | ICD-10-CM | POA: Diagnosis not present

## 2018-07-06 DIAGNOSIS — R269 Unspecified abnormalities of gait and mobility: Secondary | ICD-10-CM | POA: Diagnosis not present

## 2018-07-06 DIAGNOSIS — I5032 Chronic diastolic (congestive) heart failure: Secondary | ICD-10-CM | POA: Diagnosis not present

## 2018-07-07 DIAGNOSIS — M19072 Primary osteoarthritis, left ankle and foot: Secondary | ICD-10-CM | POA: Diagnosis not present

## 2018-07-07 DIAGNOSIS — N189 Chronic kidney disease, unspecified: Secondary | ICD-10-CM | POA: Diagnosis not present

## 2018-07-07 DIAGNOSIS — I10 Essential (primary) hypertension: Secondary | ICD-10-CM | POA: Diagnosis not present

## 2018-07-07 DIAGNOSIS — I2510 Atherosclerotic heart disease of native coronary artery without angina pectoris: Secondary | ICD-10-CM | POA: Diagnosis not present

## 2018-07-07 DIAGNOSIS — E119 Type 2 diabetes mellitus without complications: Secondary | ICD-10-CM | POA: Diagnosis not present

## 2018-07-07 DIAGNOSIS — Z8679 Personal history of other diseases of the circulatory system: Secondary | ICD-10-CM | POA: Diagnosis not present

## 2018-07-07 DIAGNOSIS — M109 Gout, unspecified: Secondary | ICD-10-CM | POA: Diagnosis not present

## 2018-07-07 DIAGNOSIS — M79671 Pain in right foot: Secondary | ICD-10-CM | POA: Diagnosis not present

## 2018-07-07 NOTE — Telephone Encounter (Signed)
Rx has been sent to the pharmacy electronically. ° °

## 2018-07-14 ENCOUNTER — Telehealth: Payer: Self-pay | Admitting: Cardiovascular Disease

## 2018-07-14 NOTE — Telephone Encounter (Signed)
Phone visit, call [phone] my chart/pre reg completed/consent obtained -- ttf

## 2018-07-20 NOTE — Telephone Encounter (Signed)

## 2018-07-21 ENCOUNTER — Telehealth (INDEPENDENT_AMBULATORY_CARE_PROVIDER_SITE_OTHER): Payer: Medicare HMO | Admitting: Cardiovascular Disease

## 2018-07-21 ENCOUNTER — Encounter: Payer: Self-pay | Admitting: Cardiovascular Disease

## 2018-07-21 VITALS — BP 130/71 | HR 69 | Ht 65.0 in | Wt 247.0 lb

## 2018-07-21 DIAGNOSIS — J9611 Chronic respiratory failure with hypoxia: Secondary | ICD-10-CM

## 2018-07-21 DIAGNOSIS — Z9861 Coronary angioplasty status: Secondary | ICD-10-CM

## 2018-07-21 DIAGNOSIS — G4733 Obstructive sleep apnea (adult) (pediatric): Secondary | ICD-10-CM

## 2018-07-21 DIAGNOSIS — Z951 Presence of aortocoronary bypass graft: Secondary | ICD-10-CM

## 2018-07-21 DIAGNOSIS — I5032 Chronic diastolic (congestive) heart failure: Secondary | ICD-10-CM

## 2018-07-21 DIAGNOSIS — J4520 Mild intermittent asthma, uncomplicated: Secondary | ICD-10-CM

## 2018-07-21 DIAGNOSIS — I10 Essential (primary) hypertension: Secondary | ICD-10-CM | POA: Diagnosis not present

## 2018-07-21 DIAGNOSIS — I480 Paroxysmal atrial fibrillation: Secondary | ICD-10-CM

## 2018-07-21 DIAGNOSIS — I2510 Atherosclerotic heart disease of native coronary artery without angina pectoris: Secondary | ICD-10-CM

## 2018-07-21 DIAGNOSIS — J9612 Chronic respiratory failure with hypercapnia: Secondary | ICD-10-CM

## 2018-07-21 DIAGNOSIS — I442 Atrioventricular block, complete: Secondary | ICD-10-CM

## 2018-07-21 DIAGNOSIS — E119 Type 2 diabetes mellitus without complications: Secondary | ICD-10-CM | POA: Diagnosis not present

## 2018-07-21 DIAGNOSIS — E782 Mixed hyperlipidemia: Secondary | ICD-10-CM

## 2018-07-21 DIAGNOSIS — I428 Other cardiomyopathies: Secondary | ICD-10-CM

## 2018-07-21 DIAGNOSIS — Z950 Presence of cardiac pacemaker: Secondary | ICD-10-CM

## 2018-07-21 DIAGNOSIS — Z8673 Personal history of transient ischemic attack (TIA), and cerebral infarction without residual deficits: Secondary | ICD-10-CM

## 2018-07-21 DIAGNOSIS — N183 Chronic kidney disease, stage 3 unspecified: Secondary | ICD-10-CM

## 2018-07-21 NOTE — Progress Notes (Signed)
Virtual Visit via Telephone Note   This visit type was conducted due to national recommendations for restrictions regarding the COVID-19 Pandemic (e.g. social distancing) in an effort to limit this patient's exposure and mitigate transmission in our community.  Due to her co-morbid illnesses, this patient is at least at moderate risk for complications without adequate follow up.  This format is felt to be most appropriate for this patient at this time.  The patient did not have access to video technology/had technical difficulties with video requiring transitioning to audio format only (telephone).  All issues noted in this document were discussed and addressed.  No physical exam could be performed with this format.  Please refer to the patient's chart for her  consent to telehealth for [facility].   Date:  07/21/2018   ID:  [name], DOB November 25, 1945, MRN [id number]  Patient Location: Home Provider Location: Home  PCP:  [facility]  Cardiologist:  Sanda Klein, MD  Electrophysiologist:  None   Evaluation Performed:  Follow-Up Visit  Chief Complaint:  CAD, CHB, PM, AFib, CHF  History of Present Illness:    [name] is a 73 y.o. female with CAD s/p CABG October 2016 (LIMA to LAD, SVG to D1, SVG to OM1 and OM2, SVG to PDA, cath 2019 shows occlusion of native coronaries and of SVG to D1), diastolic heart failure, morbid obesity, adrenal insufficiency, type 2 diabetes mellitus, hypertension, stage III chronic kidney disease, asthma, obstructive sleep apnea.   She feels that she is doing well, has only occasional wheezing. Dr. Melvyn Novas felt that she has some nasal obstruction contributing to her respiratory problems and recommended ENT evaluation.    Denies angina, orthopnea, PND, edema. Has had gout in both feet, treated with colchicine and prednisone and starting on hypouricemic agents. No palpitations or syncope.  Last pacemaker download of her  Mitchell CRT-P device shows estimated 5.5 years of generator longevity and essentially 100% A-sensed BiV-paced rhythm (programmed VDD due to chronically elevated atrial lead pacing thresholds ever since her CABG, but these are stable). She is sedentary (activity 1 hour/day) - considering that, the heart rate histograms appear appropriate. The device has not recorded atrial mode switch or VT. LV lead is programmed LV tip-LV ring.   Nonischemic cardiomyopathy and heart failure preceded the diagnosis of coronary disease. She has complete heart block and had a conventional pacemaker implanted in 2001. This was upgraded to a biventricular ICD implanted in 2008 for severe cardiomyopathy with ejection fraction 20%, downgraded to CRT-P in 2014 ([place]) following excellent response to resynchronization (hyper-responder, normalization of left ventricular systolic function).  The current atrial and right ventricular leads were implanted in 2001, the left ventricular lead was implanted in 2008. The defibrillator lead implanted in 2008 is capped and its tip was resected at the time of bypass surgery. Ever since bypass surgery, atrial pacing thresholds have been extremely high, so her device is programmed VDD. Most recent echo shows ejection fraction normal at 55-60% with grade 1 diastolic dysfunction.  She had uncomplicated cholecystectomy in February.  The patient does not have symptoms concerning for COVID-19 infection (fever, chills, cough, or new shortness of breath).   cardiac catheterization on 12/09/2017 which showed 100% proximal RCA, 60% ostial diagonal, 50% ostial D2, 100% mid LAD, patent ramus and left circumflex stent, patent LIMA to LAD, occluded SVG to ramus and the left circumflex vessel, EF 50 to 55%.  Medical therapy was recommended.  Past Medical History:  Diagnosis Date  . AICD (automatic cardioverter/defibrillator) present   . Anemia   . Arthritis   . Asthma   . CAD  (coronary artery disease)    a. 10/09/16 LHC: SVG->LAD patent, SVG->Diag patent, SVG->RCA patent, SVG->LCx occluded. EF 60%, b. 10/31/16 LHC DES to AV groove Circ, DES to intermed branch  . Cellulitis and abscess of foot 12/2014   RT FOOT  . CHF (congestive heart failure) ([place])   . Chronic bronchitis ([place])   . Chronic lower back pain   . Diverticulosis   . Facial numbness 02/2016  . Fatty liver disease, nonalcoholic   . Gastritis   . GERD (gastroesophageal reflux disease)   . Gout   . H/O hiatal hernia   . High cholesterol   . Hyperplastic colon polyp   . Hypertension   . IBS (irritable bowel syndrome)   . Internal hemorrhoids   . Ischemic cardiomyopathy   . Obesity   . On home oxygen therapy    "2L at night" (10/31/2016)  . OSA (obstructive sleep apnea)    "can't tolerate a mask" (10/30/2016)  . Pneumonia    "couple times" (10/31/2016)  . Shortness of breath   . TIA (transient ischemic attack)    "recently" (10/31/2016)  . Type II diabetes mellitus ([place])    Past Surgical History:  Procedure Laterality Date  . ABDOMINAL ULTRASOUND  12/01/2011   Peripelvic cysts- #1- 2.4x1.9x2.3cm, #2-1.2x0.9x1.2cm  . ANKLE FRACTURE SURGERY Right    "had rod put in"  . ANTERIOR CERVICAL DECOMP/DISCECTOMY FUSION    . APPENDECTOMY    . BACK SURGERY    . BIOPSY  12/29/2017   Procedure: BIOPSY;  Surgeon: [name], MD;  [facility]: [facility];  Service: Endoscopy;;  . BIV ICD INSERTION CRT-D  2001?  [name] BIV PACEMAKER GENERATOR CHANGE OUT N/A 11/02/2012   Procedure: BIV PACEMAKER GENERATOR CHANGE OUT;  Surgeon: Sanda Klein, MD;  Location: [place] CATH LAB;  Service: Cardiovascular;  Laterality: N/A;  . CARDIAC CATHETERIZATION  05/17/1999   No significant coronary obstructive disease w/ mild 20% luminal irregularity of the first diag branch of the LAD  . CARDIAC CATHETERIZATION  07/08/2002   No significant CAD, moderately depressed LV systolic function  . CARDIAC CATHETERIZATION Bilateral  04/26/2007   Normal findings, recommend medical therapy  . CARDIAC CATHETERIZATION  02/18/2008   Moderate CAD, would benefit from having a functional study, recommend continue medical therapy  . CARDIAC CATHETERIZATION  07/23/2012   Medical therapy  . CARDIAC CATHETERIZATION N/A 11/24/2014   Procedure: Left Heart Cath and Coronary Angiography;  Surgeon: Troy Sine, MD;  Location: [place] CV LAB;  Service: Cardiovascular;  Laterality: N/A;  . CARDIAC CATHETERIZATION  11/27/2014   Procedure: Intravascular Pressure Wire/FFR Study;  Surgeon: Peter M Martinique, MD;  Location: [place] CV LAB;  Service: Cardiovascular;;  . CARDIAC CATHETERIZATION  10/09/2016  . CHOLECYSTECTOMY N/A 04/09/2018   Procedure: LAPAROSCOPIC CHOLECYSTECTOMY;  Surgeon: Greer Pickerel, MD;  [facility];  Service: General;  Laterality: N/A;  . COLONOSCOPY WITH PROPOFOL N/A 12/29/2017   Procedure: COLONOSCOPY WITH PROPOFOL;  Surgeon: [name], MD;  [facility];  Service: Endoscopy;  Laterality: N/A;  . CORONARY ANGIOGRAM  2010  . CORONARY ARTERY BYPASS GRAFT N/A 11/29/2014   Procedure: CORONARY ARTERY BYPASS GRAFTING x 5 (LIMA-LAD, SVG-D, SVG-OM1-OM2, SVG-PD);  Surgeon: Melrose Nakayama, MD;  Location: [place];  Service: Open Heart Surgery;  Laterality: N/A;  . CORONARY  STENT INTERVENTION N/A 10/31/2016   Procedure: CORONARY STENT INTERVENTION;  Surgeon: Burnell Blanks, MD;  Location: [place] CV LAB;  Service: Cardiovascular;  Laterality: N/A;  . ESOPHAGOGASTRODUODENOSCOPY (EGD) WITH PROPOFOL N/A 12/29/2017   Procedure: ESOPHAGOGASTRODUODENOSCOPY (EGD) WITH PROPOFOL;  Surgeon: [name], MD;  [facility];  Service: Endoscopy;  Laterality: N/A;  . FRACTURE SURGERY    . INSERT / REPLACE / [place]  . KNEE ARTHROSCOPY Bilateral   . LEFT HEART CATH AND CORS/GRAFTS ANGIOGRAPHY N/A 12/09/2017   Procedure: LEFT HEART CATH AND CORS/GRAFTS ANGIOGRAPHY;   Surgeon: Troy Sine, MD;  Location: [place] CV LAB;  Service: Cardiovascular;  Laterality: N/A;  . LEFT HEART CATHETERIZATION WITH CORONARY ANGIOGRAM N/A 07/23/2012   Procedure: LEFT HEART CATHETERIZATION WITH CORONARY ANGIOGRAM;  Surgeon: Leonie Man, MD;  Location: [facility] CATH LAB;  Service: Cardiovascular;  Laterality: N/A;  Carlton Adam MYOVIEW  11/14/2011   Mild-moderate defect seen in Mid Inferolateral and Mid Anterolateral regions-consistant w/ infarct/scar. No significant ischemia demonstrated.  [name] POLYPECTOMY  12/29/2017   Procedure: POLYPECTOMY;  Surgeon: [name], MD;  [facility];  Service: Endoscopy;;  . RIGHT/LEFT HEART CATH AND CORONARY ANGIOGRAPHY N/A 10/09/2016   Procedure: RIGHT/LEFT HEART CATH AND CORONARY ANGIOGRAPHY;  Surgeon: Jolaine Artist, MD;  Location: [place] CV LAB;  Service: Cardiovascular;  Laterality: N/A;  . TEE WITHOUT CARDIOVERSION N/A 11/29/2014   Procedure: TRANSESOPHAGEAL ECHOCARDIOGRAM (TEE);  Surgeon: Melrose Nakayama, MD;  Location: [place];  Service: Open Heart Surgery;  Laterality: N/A;  . TRANSTHORACIC ECHOCARDIOGRAM  07/23/2012   EF 55-60%, normal-mild  . TUBAL LIGATION       Current Meds  Medication Sig  . acetaminophen (TYLENOL) 500 MG tablet Take 500 mg by mouth daily.   [name] albuterol (PROVENTIL HFA;VENTOLIN HFA) 108 (90 Base) MCG/ACT inhaler Inhale 2 puffs into the lungs every 6 (six) hours as needed for wheezing or shortness of breath.  [name] albuterol (PROVENTIL) (2.5 MG/3ML) 0.083% nebulizer solution Take 3 mLs (2.5 mg total) by nebulization every 6 (six) hours as needed for wheezing or shortness of breath.  [name] aspirin EC 81 MG tablet Take 1 tablet (81 mg total) by mouth daily.  . bisoprolol (ZEBETA) 5 MG tablet TAKE 1 TABLET EVERY DAY (Patient taking differently: Take 5 mg by mouth daily. )  . budesonide-formoterol (SYMBICORT) 160-4.5 MCG/ACT inhaler Inhale 2 puffs into the lungs 2 (two) times daily.  . cetirizine  (ZYRTEC) 10 MG tablet Take 10 mg by mouth daily.   . clopidogrel (PLAVIX) 75 MG tablet Take 1 tablet (75 mg total) by mouth daily.  . clotrimazole (LOTRIMIN) 1 % cream Apply 1 application topically 2 (two) times daily as needed (rash).  . clotrimazole (MYCELEX) 10 MG troche Take 10 mg by mouth daily as needed ([place]outh).   . colchicine 0.6 MG tablet Take 0.6 mg by mouth daily.   . diclofenac sodium (VOLTAREN) 1 % GEL Apply 2 g topically 4 (four) times daily.  . diphenhydrAMINE (BENADRYL) 25 MG tablet Take 25 mg by mouth every 6 (six) hours as needed for allergies.  [name] docusate sodium (COLACE) 100 MG capsule Take 200 mg by mouth at bedtime.   [name] EPINEPHrine (EPIPEN 2-PAK) 0.3 mg/0.3 mL IJ SOAJ injection Inject 0.3 mg into the muscle once as needed (severe allergic reaction).  . febuxostat (ULORIC) 40 MG tablet Take 40 mg by mouth daily.  . fluticasone (FLONASE) 50 MCG/ACT nasal spray Place 1  spray into both nostrils at bedtime as needed for rhinitis or allergies.   . Glucosamine HCl (GLUCOSAMINE PO) Take by mouth daily.  [name] guaiFENesin (MUCINEX) 600 MG 12 hr tablet Take 600 mg by mouth at bedtime as needed for cough or to loosen phlegm.   . hydrocortisone 2.5 % cream Apply 2.5 application topically daily as needed (itching).   . hydroxypropyl methylcellulose / hypromellose (ISOPTO TEARS / GONIOVISC) 2.5 % ophthalmic solution Place 1 drop into both eyes 3 (three) times daily.   [name] ipratropium (ATROVENT) 0.02 % nebulizer solution Take 2.5 mLs (0.5 mg total) by nebulization 4 (four) times daily.  [name] ipratropium (ATROVENT) 0.06 % nasal spray 1-2 pffs up to 4 x daily as needed (Patient taking differently: Place 2 sprays into both nostrils 4 (four) times daily as needed for rhinitis. )  . isosorbide mononitrate (IMDUR) 60 MG 24 hr tablet TAKE 1 TABLET EVERY DAY  . metolazone (ZAROXOLYN) 5 MG tablet Take 1 tablet (5 mg total) by mouth daily as needed (for weight gain of 3 LBS. or more).  . Multiple  Vitamins-Minerals (ALIVE WOMENS ENERGY PO) Take 1 tablet by mouth daily.  . nitroGLYCERIN (NITROSTAT) 0.4 MG SL tablet Place 1 tablet (0.4 mg total) under the tongue every 5 (five) minutes as needed for chest pain.  [name] ondansetron (ZOFRAN ODT) 4 MG disintegrating tablet Take 1 tablet (4 mg total) by mouth every 8 (eight) hours as needed for nausea or vomiting.  . OXYGEN Place 2-3 L into the nose See admin instructions. 2 lpm with sleep and 3 with exertion- [facility]  . pantoprazole (PROTONIX) 40 MG tablet TAKE 1 TABLET EVERY DAY (Patient taking differently: Take 40 mg by mouth 2 (two) times daily. )  . potassium chloride SA (K-DUR,KLOR-CON) 20 MEQ tablet Take 1 tablet (20 mEq total) by mouth daily.  . Probiotic Product (PROBIOTIC PO) Take by mouth daily.  . RESTASIS 0.05 % ophthalmic emulsion Place 1 drop into both eyes 2 (two) times daily.   . rosuvastatin (CRESTOR) 20 MG tablet TAKE 1 TABLET AT BEDTIME  . saxagliptin HCl (ONGLYZA) 2.5 MG TABS tablet Take 2.5 mg by mouth daily.   [name] spironolactone (ALDACTONE) 25 MG tablet Take 1 tablet (25 mg total) by mouth at bedtime.  . torsemide (DEMADEX) 20 MG tablet Take 1-2 tablets (20-40 mg total) by mouth See admin instructions. Take 40 mg by mouth in the morning and 20 mg in the evening  . triamcinolone cream (KENALOG) 0.1 % Apply 1 application topically 2 (two) times daily as needed (itching).   . [DISCONTINUED] levofloxacin (LEVAQUIN) 750 MG tablet Take 1 tablet (750 mg total) by mouth daily.     Allergies:   Ivp dye [iodinated diagnostic agents]; Shellfish allergy; Shellfish-derived products; Sulfa antibiotics; Iodine; Atorvastatin; Doxycycline; Cephalexin; and Zithromax [azithromycin]   Social History   Tobacco Use  . Smoking status: Former Smoker    Packs/day: 0.25    Years: 3.00    Pack years: 0.75    Types: Cigarettes    Last attempt to quit: 02/11/1968    Years since quitting: 50.4  . Smokeless tobacco: Never Used  Substance Use Topics  .  Alcohol use: No  . Drug use: No     Family Hx: The patient's family history includes Asthma in her sister; Breast cancer in her mother; Diabetes in her maternal grandmother and mother; Glaucoma in her maternal aunt; Heart disease in her maternal aunt and maternal grandmother; Kidney disease in her maternal grandmother.  There is no history of Colon cancer or Esophageal cancer.  ROS:   Please see the history of present illness.     All other systems reviewed and are negative.   Prior CV studies:   The following studies were reviewed today: Notes from recent cholecystectomy admission Recent PM download Labs 04/27  Labs/Other Tests and Data Reviewed:    EKG:  An ECG dated 05/01/2018 was personally reviewed today and demonstrated:  A sensed (sinus) V paced rhythm  Recent Labs: 05/01/2018: ALT 35; B Natriuretic Peptide 142.7; BUN 18; Creatinine, Ser 1.18; Hemoglobin 12.6; Platelets 111; Potassium 3.8; Sodium 138   Recent Lipid Panel Lab Results  Component Value Date/Time   CHOL 166 10/31/2016 03:14 AM   TRIG 181 (H) 10/31/2016 03:14 AM   HDL 39 (L) 10/31/2016 03:14 AM   CHOLHDL 4.3 10/31/2016 03:14 AM   LDLCALC 91 10/31/2016 03:14 AM    Wt Readings from Last 3 Encounters:  07/21/18 247 lb (112 kg)  06/09/18 237 lb (107.5 kg)  05/01/18 236 lb 15.9 oz (107.5 kg)     Objective:    Vital Signs:  BP 130/71   Pulse 69   Ht 5\' 5"  (1.651 m)   Wt 247 lb (112 kg)   BMI 41.10 kg/m    VITAL SIGNS:  reviewed unable to examine  ASSESSMENT & PLAN:    1. CHF: no recent diuretic dose adjustments or need for  metolazone in a long time.  She only uses oxygen at night.  Left ventricular systolic function normalized after CRT (hyper responder). On bisoprolol, on which she has done better than on carvedilol.  2. CAD s/p CABG occlusion of proximal RCA and LCX and mid LAD, as well as occlusion of SVG-ramus/D1. Denies angina (sedentary). 3. CHB: She is pacemaker dependent and has 100%  biventricular pacing 4. CRT-P with normal device function except for very high atrial pacing thresholds (she does not require atrial pacing). Device is programmed VDD. The current right atrial lead and right ventricular lead were placed in 2001. The current left ventricular lead was placed in 2008. Consider replacement of the right atrial lead at the time of her next generator change out.   5. Asthma: Etiology of her dyspnea is multifactorial, including morbid obesity and restrictive physiology, asthma, pulmonary hypertension due to obesity and obstructive sleep apnea as well as heart failure. Dr. Melvyn Novas told the patient he suspects nasal obstruction and recommended ENT evaluation. Will make the referral for her to Dr. Constance Holster. 6. OSA: Intolerant of CPAP 7. Morbid obesity: Makes diagnosis and management of her cardiac problems more difficult.  COVID-19 Education: The signs and symptoms of COVID-19 were discussed with the patient and how to seek care for testing (follow up with PCP or arrange E-visit).  The importance of social distancing was discussed today.  Time:   Today, I have spent 16 minutes with the patient with telehealth technology discussing the above problems.     Medication Adjustments/Labs and Tests Ordered: Current medicines are reviewed at length with the patient today.  Concerns regarding medicines are outlined above.   Tests Ordered: No orders of the defined types were placed in this encounter.   Medication Changes: No orders of the defined types were placed in this encounter.  Patient Instructions  Medication Instructions:  Your physician recommends that you continue on your current medications as directed. Please refer to the Current Medication list given to you today.  If you need a refill on your cardiac medications  before your next appointment, please call your pharmacy.   Lab work: Your provider would like for you to return within the month to have the following labs  drawn: fasting lipid. You do not need an appointment for the lab. Once in our office lobby there is a podium where you can sign in and ring the doorbell to alert [place] that you are here. The lab is open from 8:00 am to 4:30 pm; closed for lunch from 12:45pm-1:45pm.   Testing/Procedures: None ordered  Follow-Up: At [facility], you and your health needs are our priority.  As part of our continuing mission to provide you with exceptional heart care, we have created designated Provider Care Teams.  These Care Teams include your primary Cardiologist (physician) and Advanced Practice Providers (APPs -  Physician Assistants and Nurse Practitioners) who all work together to provide you with the care you need, when you need it.  You will need a follow up appointment in 12 months with Dr. [name] and 6 months with an APP.   Special Instructions: Please call Dr. Izora Gala, ENT, at [phone] [facility] ENT [place], [place]     Disposition:  Follow up 6 months  Signed, Sanda Klein, MD  07/21/2018 10:08 AM    [place]

## 2018-07-21 NOTE — Patient Instructions (Addendum)
Medication Instructions:  Your physician recommends that you continue on your current medications as directed. Please refer to the Current Medication list given to you today.  If you need a refill on your cardiac medications before your next appointment, please call your pharmacy.   Lab work: Your provider would like for you to return within the month to have the following labs drawn: fasting lipid. You do not need an appointment for the lab. Once in our office lobby there is a podium where you can sign in and ring the doorbell to alert [place] that you are here. The lab is open from 8:00 am to 4:30 pm; closed for lunch from 12:45pm-1:45pm.   Testing/Procedures: None ordered  Follow-Up: At [facility], you and your health needs are our priority.  As part of our continuing mission to provide you with exceptional heart care, we have created designated Provider Care Teams.  These Care Teams include your primary Cardiologist (physician) and Advanced Practice Providers (APPs -  Physician Assistants and Nurse Practitioners) who all work together to provide you with the care you need, when you need it.  You will need a follow up appointment in 12 months with Dr. [name] and 6 months with an APP.   Special Instructions: Please call Dr. Izora Gala, ENT, at [phone] [facility] E[facility] [place], [place]

## 2018-07-30 DIAGNOSIS — T485X5A Adverse effect of other anti-common-cold drugs, initial encounter: Secondary | ICD-10-CM | POA: Diagnosis not present

## 2018-07-30 DIAGNOSIS — I509 Heart failure, unspecified: Secondary | ICD-10-CM | POA: Insufficient documentation

## 2018-07-30 DIAGNOSIS — G8929 Other chronic pain: Secondary | ICD-10-CM | POA: Insufficient documentation

## 2018-07-30 DIAGNOSIS — J310 Chronic rhinitis: Secondary | ICD-10-CM | POA: Diagnosis not present

## 2018-07-30 DIAGNOSIS — R51 Headache: Secondary | ICD-10-CM | POA: Diagnosis not present

## 2018-07-30 DIAGNOSIS — Z9981 Dependence on supplemental oxygen: Secondary | ICD-10-CM | POA: Diagnosis not present

## 2018-07-31 DIAGNOSIS — R269 Unspecified abnormalities of gait and mobility: Secondary | ICD-10-CM | POA: Diagnosis not present

## 2018-07-31 DIAGNOSIS — J4530 Mild persistent asthma, uncomplicated: Secondary | ICD-10-CM | POA: Diagnosis not present

## 2018-07-31 DIAGNOSIS — J4520 Mild intermittent asthma, uncomplicated: Secondary | ICD-10-CM | POA: Diagnosis not present

## 2018-07-31 DIAGNOSIS — I5032 Chronic diastolic (congestive) heart failure: Secondary | ICD-10-CM | POA: Diagnosis not present

## 2018-08-04 ENCOUNTER — Telehealth: Payer: Self-pay | Admitting: Cardiovascular Disease

## 2018-08-04 NOTE — Telephone Encounter (Signed)
  Patient is calling c/o weakness and breathing issues. She states she is feeling short of breath even when she is wearing her oxygen and she states she has it when at rest and worse when active. When active she almost feel like she is going to pass out. She does state the short of breath comes and goes, it is not constant. She says she has had some chest pain but that is pretty normal for her to have that. She denies any fever or other symptoms. She would like to speak to the nurse.

## 2018-08-04 NOTE — Telephone Encounter (Signed)
Spoke with pt, she feels like the SOB is getting worse over the last week from when she saw dr croitoru. Usually she feels better after taking her torsemide but that has not really made a difference. She denies weight gain or orthopnea. She has some swelling in her feet and ankles, her abdomen is not bloated. She has been wearing her oxygen all day for the last 3-4 months. She saw dr Constance Holster and he did not think a nasal obstruction was causing any problems. She also reports feeling weak but bp is 135/72 with pulse 73. She was given the okay to take an extra torsemide in the afternoon for the next 3 days to see if that makes a difference in her SOB. Fluid restrictions discussed with the patient. She will call back by the end of the week if no change or symptoms get worse. Pt agreed with this plan.

## 2018-08-06 DIAGNOSIS — J9611 Chronic respiratory failure with hypoxia: Secondary | ICD-10-CM | POA: Diagnosis not present

## 2018-08-06 DIAGNOSIS — R269 Unspecified abnormalities of gait and mobility: Secondary | ICD-10-CM | POA: Diagnosis not present

## 2018-08-06 DIAGNOSIS — J4520 Mild intermittent asthma, uncomplicated: Secondary | ICD-10-CM | POA: Diagnosis not present

## 2018-08-06 DIAGNOSIS — R062 Wheezing: Secondary | ICD-10-CM | POA: Diagnosis not present

## 2018-08-06 DIAGNOSIS — I5032 Chronic diastolic (congestive) heart failure: Secondary | ICD-10-CM | POA: Diagnosis not present

## 2018-08-06 DIAGNOSIS — N183 Chronic kidney disease, stage 3 (moderate): Secondary | ICD-10-CM | POA: Diagnosis not present

## 2018-08-06 DIAGNOSIS — I5033 Acute on chronic diastolic (congestive) heart failure: Secondary | ICD-10-CM | POA: Diagnosis not present

## 2018-08-06 DIAGNOSIS — E7800 Pure hypercholesterolemia, unspecified: Secondary | ICD-10-CM | POA: Diagnosis not present

## 2018-08-06 DIAGNOSIS — E1122 Type 2 diabetes mellitus with diabetic chronic kidney disease: Secondary | ICD-10-CM | POA: Diagnosis not present

## 2018-08-06 DIAGNOSIS — I10 Essential (primary) hypertension: Secondary | ICD-10-CM | POA: Diagnosis not present

## 2018-08-06 DIAGNOSIS — I2510 Atherosclerotic heart disease of native coronary artery without angina pectoris: Secondary | ICD-10-CM | POA: Diagnosis not present

## 2018-08-06 DIAGNOSIS — E1169 Type 2 diabetes mellitus with other specified complication: Secondary | ICD-10-CM | POA: Diagnosis not present

## 2018-08-12 ENCOUNTER — Ambulatory Visit: Payer: Medicare HMO | Admitting: Internal Medicine

## 2018-08-16 ENCOUNTER — Telehealth: Payer: Self-pay | Admitting: Gastroenterology

## 2018-08-16 NOTE — Telephone Encounter (Signed)
Pt reported that she had gall bladder surgery in Feb 2020 and she is experiencing bloating, diarrhea and constipation.  Please advise.

## 2018-08-16 NOTE — Telephone Encounter (Signed)
S/P cholecystectomy 04/09/18. She has been having diarrhea alternating with constipation. This started soon after the surgery. Surgeon had her start probiotics 2 months ago. No improvement. She is also alternating taking Imodium to stop the diarrhea. She then has to take Miralax because she feels constipated. Unable to eat very much. She become nauseous. No vomiting. I have asked her to stop Imodium and Miralax until I could talk with you.

## 2018-08-17 ENCOUNTER — Other Ambulatory Visit: Payer: Self-pay

## 2018-08-17 ENCOUNTER — Ambulatory Visit (INDEPENDENT_AMBULATORY_CARE_PROVIDER_SITE_OTHER): Payer: Medicare HMO | Admitting: *Deleted

## 2018-08-17 DIAGNOSIS — I442 Atrioventricular block, complete: Secondary | ICD-10-CM | POA: Diagnosis not present

## 2018-08-17 DIAGNOSIS — Z8679 Personal history of other diseases of the circulatory system: Secondary | ICD-10-CM | POA: Diagnosis not present

## 2018-08-17 DIAGNOSIS — I2510 Atherosclerotic heart disease of native coronary artery without angina pectoris: Secondary | ICD-10-CM | POA: Diagnosis not present

## 2018-08-17 DIAGNOSIS — E119 Type 2 diabetes mellitus without complications: Secondary | ICD-10-CM | POA: Diagnosis not present

## 2018-08-17 DIAGNOSIS — M19072 Primary osteoarthritis, left ankle and foot: Secondary | ICD-10-CM | POA: Diagnosis not present

## 2018-08-17 DIAGNOSIS — M109 Gout, unspecified: Secondary | ICD-10-CM | POA: Diagnosis not present

## 2018-08-17 DIAGNOSIS — N189 Chronic kidney disease, unspecified: Secondary | ICD-10-CM | POA: Diagnosis not present

## 2018-08-17 DIAGNOSIS — I10 Essential (primary) hypertension: Secondary | ICD-10-CM | POA: Diagnosis not present

## 2018-08-17 LAB — CUP PACEART REMOTE DEVICE CHECK
Battery Remaining Longevity: 66 mo
Battery Remaining Percentage: 88 %
Brady Statistic RA Percent Paced: 0 %
Brady Statistic RV Percent Paced: 99 %
Date Time Interrogation Session: 20200707063900
Implantable Lead Implant Date: 20080923
Implantable Lead Implant Date: 20080923
Implantable Lead Implant Date: 20080923
Implantable Lead Location: 753858
Implantable Lead Location: 753859
Implantable Lead Location: 753860
Implantable Lead Model: 4285
Implantable Lead Model: 4555
Implantable Lead Model: 7120
Implantable Lead Serial Number: 170220
Implantable Lead Serial Number: 486468
Implantable Pulse Generator Implant Date: 20140923
Lead Channel Impedance Value: 1167 Ohm
Lead Channel Impedance Value: 660 Ohm
Lead Channel Impedance Value: 828 Ohm
Lead Channel Pacing Threshold Amplitude: 0.5 V
Lead Channel Pacing Threshold Amplitude: 1.2 V
Lead Channel Pacing Threshold Pulse Width: 0.4 ms
Lead Channel Pacing Threshold Pulse Width: 0.8 ms
Lead Channel Setting Pacing Amplitude: 1.9 V
Lead Channel Setting Pacing Amplitude: 2 V
Lead Channel Setting Pacing Pulse Width: 0.4 ms
Lead Channel Setting Pacing Pulse Width: 0.8 ms
Lead Channel Setting Sensing Sensitivity: 2.5 mV
Lead Channel Setting Sensing Sensitivity: 2.5 mV
Pulse Gen Serial Number: 100731

## 2018-08-17 MED ORDER — CHOLESTYRAMINE LIGHT 4 G PO PACK: 4.0000 g | PACK | Freq: Two times a day (BID) | ORAL | 11 refills | Status: DC

## 2018-08-17 NOTE — Telephone Encounter (Signed)
Reviewed plan with the patient. Discussed purpose of the medication. Reason for limiting the Imodium. Discussed her diet.  Medication to [place] [address]. Patient will call back PRN.

## 2018-08-17 NOTE — Telephone Encounter (Signed)
Please advise patient to start Prevalite 1/2 packet twice daily. Minimize use of Imodium. Small frequent meals, avoid high calorie or fat diet.

## 2018-08-18 DIAGNOSIS — I10 Essential (primary) hypertension: Secondary | ICD-10-CM | POA: Diagnosis not present

## 2018-08-18 DIAGNOSIS — Z6841 Body Mass Index (BMI) 40.0 and over, adult: Secondary | ICD-10-CM | POA: Diagnosis not present

## 2018-08-18 DIAGNOSIS — Z0001 Encounter for general adult medical examination with abnormal findings: Secondary | ICD-10-CM | POA: Diagnosis not present

## 2018-08-18 DIAGNOSIS — M109 Gout, unspecified: Secondary | ICD-10-CM | POA: Diagnosis not present

## 2018-08-18 DIAGNOSIS — N183 Chronic kidney disease, stage 3 (moderate): Secondary | ICD-10-CM | POA: Diagnosis not present

## 2018-08-18 DIAGNOSIS — G4730 Sleep apnea, unspecified: Secondary | ICD-10-CM | POA: Diagnosis not present

## 2018-08-18 DIAGNOSIS — E1169 Type 2 diabetes mellitus with other specified complication: Secondary | ICD-10-CM | POA: Diagnosis not present

## 2018-08-18 DIAGNOSIS — E7800 Pure hypercholesterolemia, unspecified: Secondary | ICD-10-CM | POA: Diagnosis not present

## 2018-08-18 DIAGNOSIS — E1122 Type 2 diabetes mellitus with diabetic chronic kidney disease: Secondary | ICD-10-CM | POA: Diagnosis not present

## 2018-08-18 DIAGNOSIS — I5032 Chronic diastolic (congestive) heart failure: Secondary | ICD-10-CM | POA: Diagnosis not present

## 2018-08-20 ENCOUNTER — Other Ambulatory Visit: Payer: Self-pay | Admitting: Family Medicine

## 2018-08-20 DIAGNOSIS — E2839 Other primary ovarian failure: Secondary | ICD-10-CM

## 2018-08-24 IMAGING — DX DG CHEST 2V
2 series · 2 of 2 positions shown · non-contrast
Comparison: 01/30/2017

CLINICAL DATA: Chest pain at rest for 3 weeks. Coronary artery
disease. Asthma. Diabetes.

EXAM:
CHEST - 2 VIEW

[dg chest 2 view (1 of 2)]
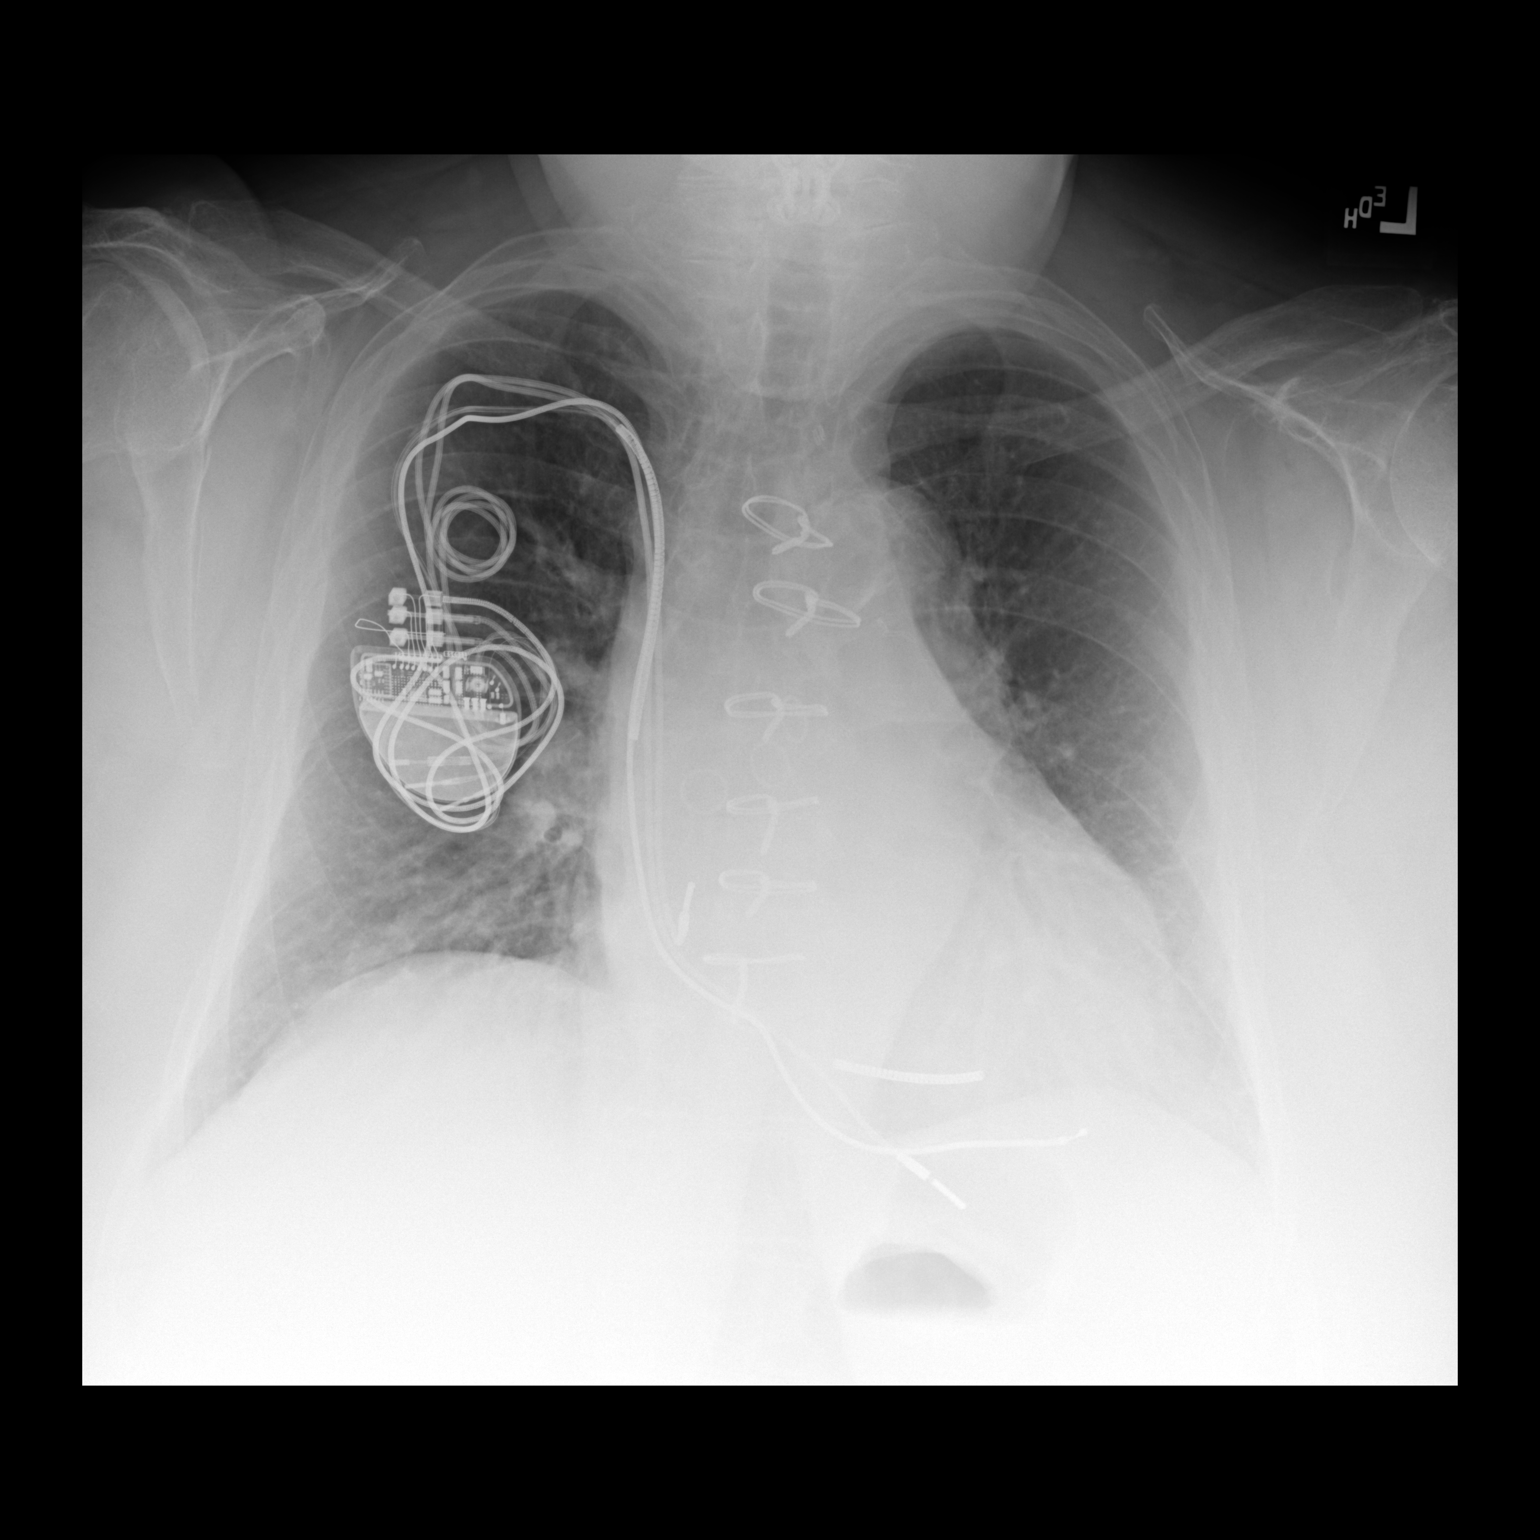

[dg chest 2 view (2 of 2)]
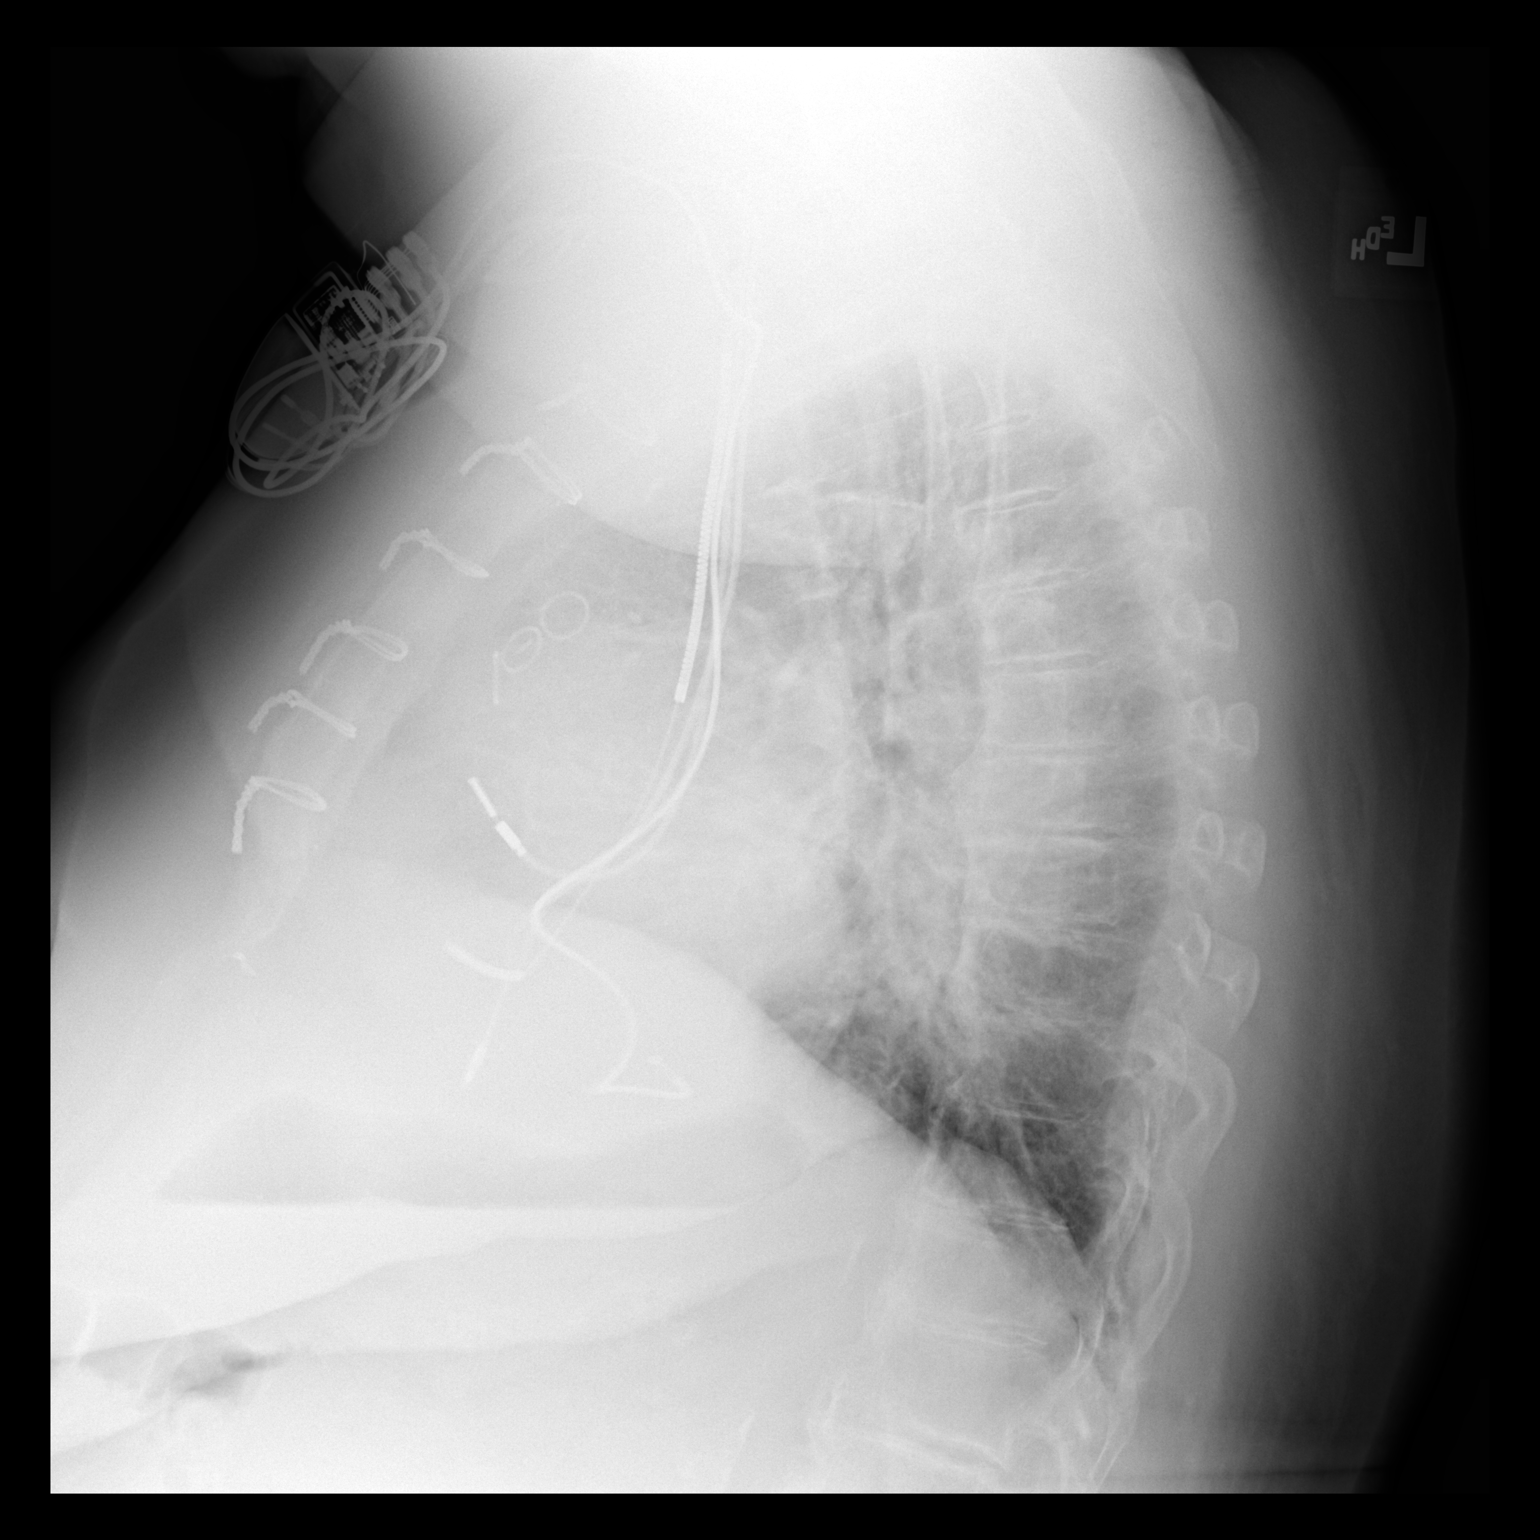

[2 of 2 positions shown; findings below may reference images not displayed]

FINDINGS: Mild cardiomegaly remains stable. Aortic atherosclerosis.
Transvenous pacemaker remains in appropriate position. Prior CABG
again noted. Both lungs are clear. No evidence of pneumothorax or
pleural effusion.
IMPRESSION: Stable mild cardiomegaly.  No active lung disease.

## 2018-08-27 ENCOUNTER — Other Ambulatory Visit: Payer: Self-pay | Admitting: Cardiology

## 2018-08-28 ENCOUNTER — Encounter: Payer: Self-pay | Admitting: Cardiology

## 2018-08-28 NOTE — Progress Notes (Signed)
Remote pacemaker transmission.   

## 2018-08-30 ENCOUNTER — Telehealth: Payer: Self-pay | Admitting: Gastroenterology

## 2018-08-30 DIAGNOSIS — I5032 Chronic diastolic (congestive) heart failure: Secondary | ICD-10-CM | POA: Diagnosis not present

## 2018-08-30 DIAGNOSIS — J4520 Mild intermittent asthma, uncomplicated: Secondary | ICD-10-CM | POA: Diagnosis not present

## 2018-08-30 DIAGNOSIS — R269 Unspecified abnormalities of gait and mobility: Secondary | ICD-10-CM | POA: Diagnosis not present

## 2018-08-30 DIAGNOSIS — J4530 Mild persistent asthma, uncomplicated: Secondary | ICD-10-CM | POA: Diagnosis not present

## 2018-08-30 NOTE — Telephone Encounter (Signed)
Patient is advised.  

## 2018-08-30 NOTE — Telephone Encounter (Signed)
Patient called said that cholestyramine light (PREVALITE) 4 g packet  Is not working well. Would like to speak to someone.

## 2018-08-30 NOTE — Telephone Encounter (Signed)
Okay continue cholestyramine.  Small frequent meals with low-fat diet.  Nausea likely secondary to polypharmacy.  Antireflux measures

## 2018-08-30 NOTE — Telephone Encounter (Signed)
Patient is taking the cholestyramine and feels she has control over her bowels now. Denies diarrhea. She does have complaints of stomach pain in the center of her abdomen. She feels nauseated at times with food.

## 2018-08-31 DIAGNOSIS — I10 Essential (primary) hypertension: Secondary | ICD-10-CM | POA: Diagnosis not present

## 2018-08-31 DIAGNOSIS — E7800 Pure hypercholesterolemia, unspecified: Secondary | ICD-10-CM | POA: Diagnosis not present

## 2018-08-31 DIAGNOSIS — J4520 Mild intermittent asthma, uncomplicated: Secondary | ICD-10-CM | POA: Diagnosis not present

## 2018-08-31 DIAGNOSIS — E1122 Type 2 diabetes mellitus with diabetic chronic kidney disease: Secondary | ICD-10-CM | POA: Diagnosis not present

## 2018-08-31 DIAGNOSIS — I5032 Chronic diastolic (congestive) heart failure: Secondary | ICD-10-CM | POA: Diagnosis not present

## 2018-08-31 DIAGNOSIS — I2510 Atherosclerotic heart disease of native coronary artery without angina pectoris: Secondary | ICD-10-CM | POA: Diagnosis not present

## 2018-08-31 DIAGNOSIS — N183 Chronic kidney disease, stage 3 (moderate): Secondary | ICD-10-CM | POA: Diagnosis not present

## 2018-08-31 DIAGNOSIS — I5033 Acute on chronic diastolic (congestive) heart failure: Secondary | ICD-10-CM | POA: Diagnosis not present

## 2018-08-31 DIAGNOSIS — E1169 Type 2 diabetes mellitus with other specified complication: Secondary | ICD-10-CM | POA: Diagnosis not present

## 2018-09-03 ENCOUNTER — Other Ambulatory Visit: Payer: Self-pay | Admitting: Cardiovascular Disease

## 2018-09-05 DIAGNOSIS — J9611 Chronic respiratory failure with hypoxia: Secondary | ICD-10-CM | POA: Diagnosis not present

## 2018-09-05 DIAGNOSIS — R062 Wheezing: Secondary | ICD-10-CM | POA: Diagnosis not present

## 2018-09-05 DIAGNOSIS — I5032 Chronic diastolic (congestive) heart failure: Secondary | ICD-10-CM | POA: Diagnosis not present

## 2018-09-05 DIAGNOSIS — R269 Unspecified abnormalities of gait and mobility: Secondary | ICD-10-CM | POA: Diagnosis not present

## 2018-09-16 ENCOUNTER — Telehealth: Payer: Self-pay | Admitting: Gastroenterology

## 2018-09-17 ENCOUNTER — Other Ambulatory Visit: Payer: Self-pay

## 2018-09-17 MED ORDER — ONDANSETRON 4 MG PO TBDP: 4.0000 mg | ORAL_TABLET | Freq: Three times a day (TID) | ORAL | 0 refills | Status: DC | PRN

## 2018-09-17 NOTE — Telephone Encounter (Signed)
Spoke with the patient. She stopped the cholestyramine because she felt it could be making her abdominal pain worse. She hurts in the center of the abdomen. Nauseated " a lot." She is not experiencing diarrhea. Feels she has better control over her bowels now. Frustrated that she continues to hurt. Agrees she needs evaluation. Appointment scheduled.

## 2018-09-19 IMAGING — CR DG CHEST 2V
2 series · 2 of 2 positions shown · non-contrast
Comparison: Radiographs August 28, 2017.

CLINICAL DATA: Chest pain, shortness of breath.

EXAM:
CHEST - 2 VIEW

[chest lat]
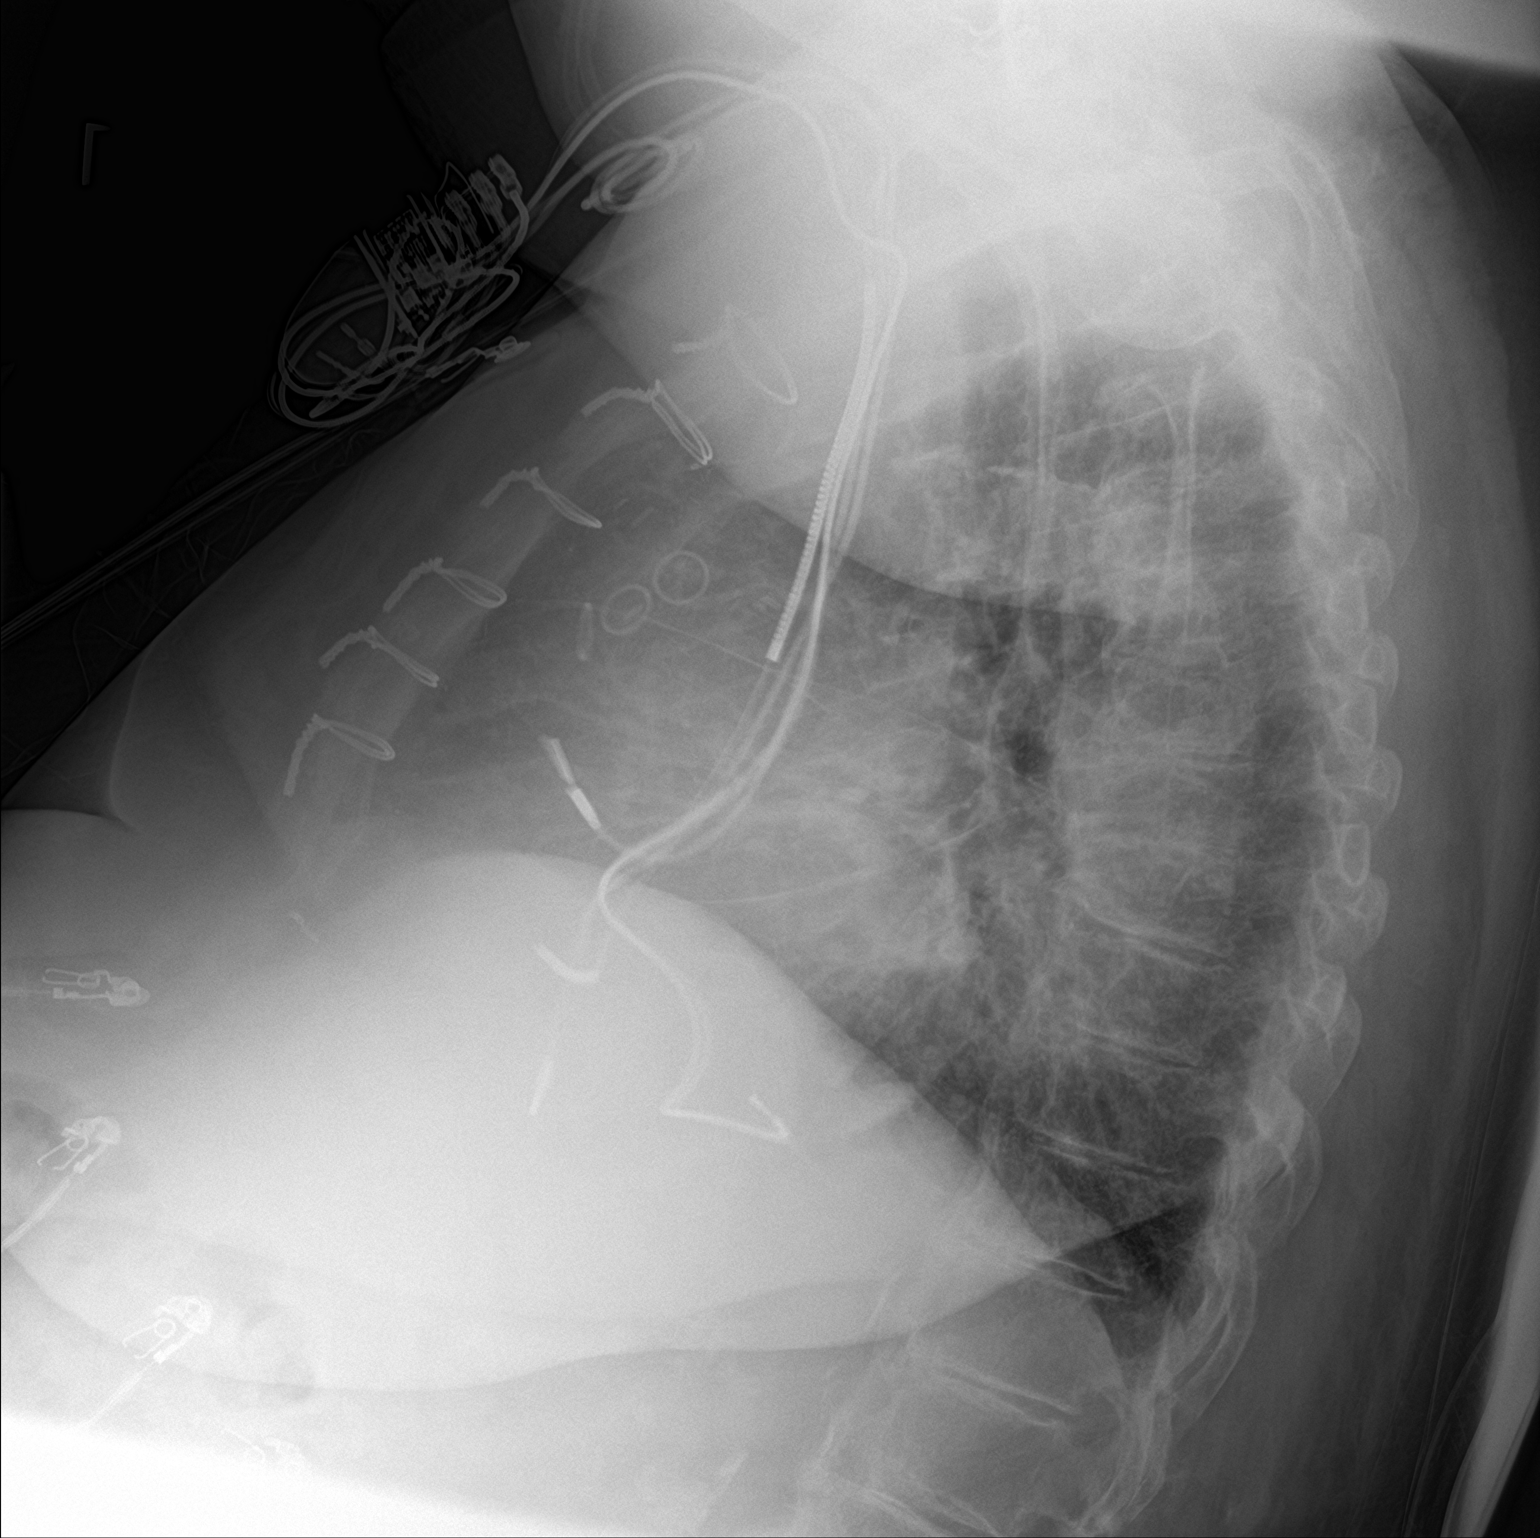

[chest ap]
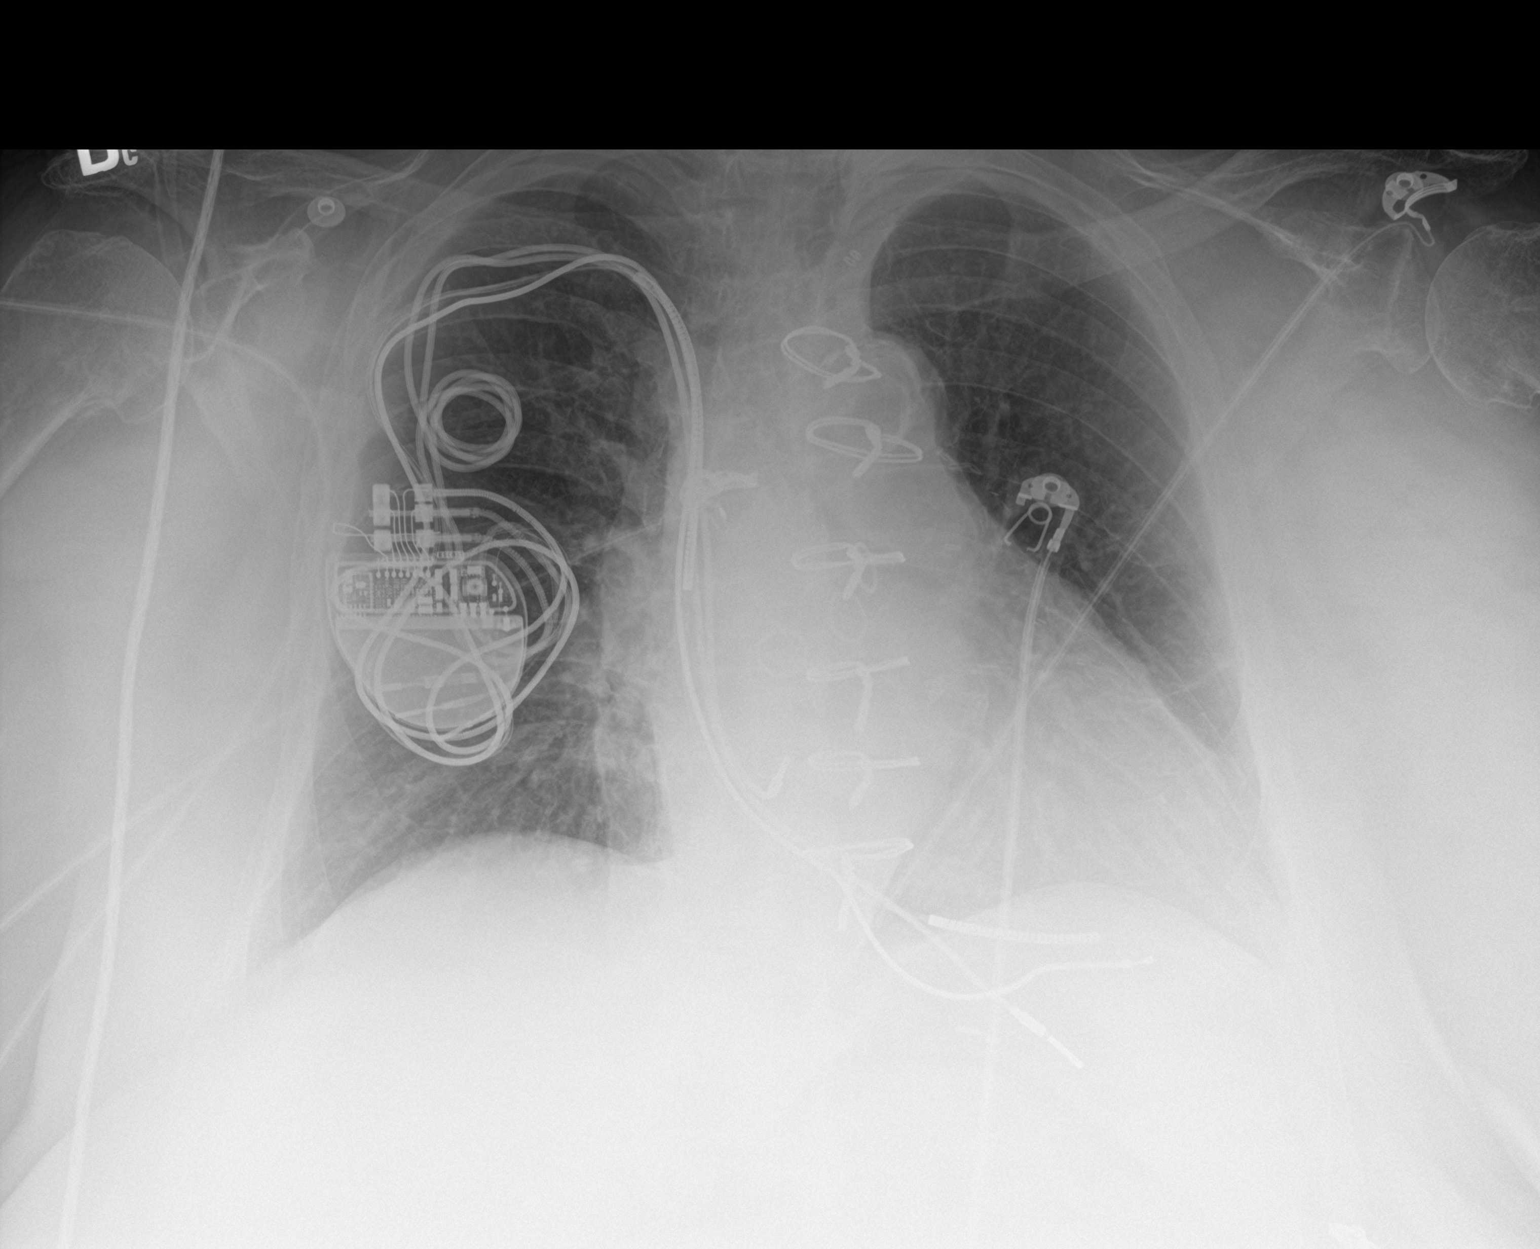

[2 of 2 positions shown; findings below may reference images not displayed]

FINDINGS: Stable cardiomegaly. Status post coronary artery bypass graft.
Atherosclerosis of thoracic aorta is noted. Right-sided pacemaker is
unchanged in position. No pneumothorax or pleural effusion is noted.
No acute pulmonary disease is noted. Bony thorax is unremarkable.
IMPRESSION: No active cardiopulmonary disease.

Aortic Atherosclerosis ([id number]).

## 2018-09-20 DIAGNOSIS — K1379 Other lesions of oral mucosa: Secondary | ICD-10-CM | POA: Diagnosis not present

## 2018-09-20 DIAGNOSIS — J0110 Acute frontal sinusitis, unspecified: Secondary | ICD-10-CM | POA: Diagnosis not present

## 2018-09-27 ENCOUNTER — Telehealth: Payer: Self-pay | Admitting: Cardiovascular Disease

## 2018-09-27 NOTE — Telephone Encounter (Signed)
New Message      Pt c/o Shortness Of Breath: STAT if SOB developed within the last 24 hours or pt is noticeably SOB on the phone  1. Are you currently SOB (can you hear that pt is SOB on the phone)? No, did not hear sob   2. How long have you been experiencing SOB? Two or three days  3. Are you SOB when sitting or when up moving around? Sitting or moving  4. Are you currently experiencing any other symptoms? fatigue

## 2018-09-27 NOTE — Telephone Encounter (Signed)
New Message   Pt c/o Shortness Of Breath: STAT if SOB developed within the last 24 hours or pt is noticeably SOB on the phone  1. Are you currently SOB (can you hear that pt is SOB on the phone)? No, did not hear sob   2. How long have you been experiencing SOB? Two or three days  3. Are you SOB when sitting or when up moving around? Sitting or moving  4. Are you currently experiencing any other symptoms? fatigue

## 2018-09-27 NOTE — Telephone Encounter (Signed)
Returned the call to the patient. She stated that she has been feeling bad for the past week. She has been having shortness of breath for the past 2-3 days. Denies any swelling outside of her norm. She stated that her weight has been staying steady but did not have any actual numbers.   She did state that she has been having trouble with her oxygen machine. She stated that she feels like she is getting enough oxygen. She has been advised to call the company to have them check the machine.   She is currently taking: Torsemide 40 mg one day then 60 mg on the next day Spirolactone 25 mg once daily Metolazone not taking  She can lie down without increased shortness of breath and stated that she rests okay at night. The shortness of breath is worse on exertion. She could not come in today for an appointment due to lack of transportation. She has been set up with an appointment tomorrow at 11:15 with Jory Sims DNP. Since she is by herself, she has been advised to call 911 if she feels like the shortness of breath is getting worse or she develops chest pain.

## 2018-09-27 NOTE — Telephone Encounter (Signed)
Thanks.  The pattern of Dyspnea sounds more pulmonary than cardiac.

## 2018-09-28 ENCOUNTER — Encounter: Payer: Self-pay | Admitting: Adult Health

## 2018-09-28 ENCOUNTER — Other Ambulatory Visit: Payer: Self-pay

## 2018-09-28 ENCOUNTER — Ambulatory Visit (INDEPENDENT_AMBULATORY_CARE_PROVIDER_SITE_OTHER): Payer: Medicare HMO | Admitting: Adult Health

## 2018-09-28 VITALS — BP 100/58 | HR 68 | Temp 97.2°F | Ht 66.0 in | Wt 259.0 lb

## 2018-09-28 DIAGNOSIS — Z79899 Other long term (current) drug therapy: Secondary | ICD-10-CM | POA: Diagnosis not present

## 2018-09-28 DIAGNOSIS — I2510 Atherosclerotic heart disease of native coronary artery without angina pectoris: Secondary | ICD-10-CM

## 2018-09-28 DIAGNOSIS — E7800 Pure hypercholesterolemia, unspecified: Secondary | ICD-10-CM | POA: Diagnosis not present

## 2018-09-28 DIAGNOSIS — I5032 Chronic diastolic (congestive) heart failure: Secondary | ICD-10-CM

## 2018-09-28 DIAGNOSIS — E559 Vitamin D deficiency, unspecified: Secondary | ICD-10-CM | POA: Diagnosis not present

## 2018-09-28 DIAGNOSIS — I442 Atrioventricular block, complete: Secondary | ICD-10-CM | POA: Diagnosis not present

## 2018-09-28 DIAGNOSIS — I10 Essential (primary) hypertension: Secondary | ICD-10-CM

## 2018-09-28 MED ORDER — METOLAZONE 5 MG PO TABS: 5.0000 mg | ORAL_TABLET | Freq: Every day | ORAL | 2 refills | Status: DC | PRN

## 2018-09-28 NOTE — Progress Notes (Signed)
Cardiology Office Note   Date:  09/28/2018   ID:  [name], DOB 06/09/1945, MRN [id number]  PCP:  [facility]  Cardiologist:  Dr.Croitoru  CC: Dyspnea    History of Present Illness: [name] is a 73 y.o. female who presents for ongoing assessment and management of CAD, cath 2019 shows occlusion of native coronaries and of SVG to D1), diastolic heart failure, pacemaker in situ ([place]  CRT-P device in the setting of complete heart block) morbid obesity, adrenal insufficiency, type 2 diabetes mellitus, hypertension, stage III chronic kidney disease, asthma, obstructive sleep apnea. She is followed by Dr.Wert.  Dr. Melvyn Novas for the during the day MS the bandages people can be going on did send her to ENT to assess further due to continued respiratory issues.   (Copied for accuracy) Nonischemic cardiomyopathy and heart failure preceded the diagnosis of coronary disease. She has complete heart block and had a conventional pacemaker implanted in 2001. This was upgraded to a biventricular ICD implanted in 2008 for severe cardiomyopathy with ejection fraction 20%, downgraded to CRT-P in 2014 ([place]) following excellent response to resynchronization (hyper-responder, normalization of left ventricular systolic function).  The current atrial and right ventricular leads were implanted in 2001, the left ventricular lead was implanted in 2008. The defibrillator lead implanted in 2008 is capped and its tip was resected at the time of bypass surgery. Ever since bypass surgery, atrial pacing thresholds have been extremely high, so her device is programmed VDD. Most recent echo shows ejection fraction normal at 55-60% with grade 1 diastolic dysfunction.  Cardiac catheterization on 12/09/2017 which showed 100% proximal RCA, 60% ostial diagonal, 50% ostial D2, 100% mid LAD, patent ramus and left circumflex stent, patent LIMA to LAD, occluded SVG to ramus  and the left circumflex vessel, EF 50 to 55%. Medical therapy was recommended.   On last office visit with Dr. Sallyanne [name], on 07/21/2018, the patient was stable continued on oxygen support during the night.  He noted that her device was programmed to VDD.  The current right atrial lead and right ventricular lead were placed in 2001 with the current left ventricular lead placed in 2008.  He noted that it would be considered to replace the right atrial lead at the time of her next generator change out.  She was referred to Dr. Constance Holster, ENT.  [name] called our office on 09/27/2018 with complaints of worsening shortness of breath, with minimal exertion.  Overall fatigue.  Dr.Croitoru addressed her complaint by reviewing her notes, and he did not feel that it was related to her cardiac status.  She was offered an appointment today.  In the interim since last office visit the patient has had a lap chole and is now on Questran.  She is having a lot of GI issues to include loose stools since that time and has been followed by GI but has not seen them in a few months.  Certainly not since having had cholecystectomy.  She reports that she has had worsening fatigue, cannot get up and walk from her living room to her bedroom which is less than 20 feet, without becoming very short of breath and tired.  She has been gaining weight but has not been physically active.  She is not adhere to a low-sodium low-cholesterol diet and is been eating fast food.  She is gained approximately 25 pounds since April 2020.  She is medically compliant.  She has not followed  up with pulmonary, or GI.  She states that her heart rate increases to 115 bpm with minimal exertion.   Past Medical History:  Diagnosis Date  . AICD (automatic cardioverter/defibrillator) present   . Anemia   . Arthritis   . Asthma   . CAD (coronary artery disease)    a. 10/09/16 LHC: SVG->LAD patent, SVG->Diag patent, SVG->RCA patent, SVG->LCx occluded. EF  60%, b. 10/31/16 LHC DES to AV groove Circ, DES to intermed branch  . Cellulitis and abscess of foot 12/2014   RT FOOT  . CHF (congestive heart failure) ([place])   . Chronic bronchitis ([place])   . Chronic lower back pain   . Diverticulosis   . Facial numbness 02/2016  . Fatty liver disease, nonalcoholic   . Gastritis   . GERD (gastroesophageal reflux disease)   . Gout   . H/O hiatal hernia   . High cholesterol   . Hyperplastic colon polyp   . Hypertension   . IBS (irritable bowel syndrome)   . Internal hemorrhoids   . Ischemic cardiomyopathy   . Obesity   . On home oxygen therapy    "2L at night" (10/31/2016)  . OSA (obstructive sleep apnea)    "can't tolerate a mask" (10/30/2016)  . Pneumonia    "couple times" (10/31/2016)  . Shortness of breath   . TIA (transient ischemic attack)    "recently" (10/31/2016)  . Type II diabetes mellitus ([place])     Past Surgical History:  Procedure Laterality Date  . ABDOMINAL ULTRASOUND  12/01/2011   Peripelvic cysts- #1- 2.4x1.9x2.3cm, #2-1.2x0.9x1.2cm  . ANKLE FRACTURE SURGERY Right    "had rod put in"  . ANTERIOR CERVICAL DECOMP/DISCECTOMY FUSION    . APPENDECTOMY    . BACK SURGERY    . BIOPSY  12/29/2017   Procedure: BIOPSY;  Surgeon: [name], MD;  [facility]: [facility];  Service: Endoscopy;;  . BIV ICD INSERTION CRT-D  2001?  [name] BIV PACEMAKER GENERATOR CHANGE OUT N/A 11/02/2012   Procedure: BIV PACEMAKER GENERATOR CHANGE OUT;  Surgeon: Sanda Klein, MD;  Location: [place] CATH LAB;  Service: Cardiovascular;  Laterality: N/A;  . CARDIAC CATHETERIZATION  05/17/1999   No significant coronary obstructive disease w/ mild 20% luminal irregularity of the first diag branch of the LAD  . CARDIAC CATHETERIZATION  07/08/2002   No significant CAD, moderately depressed LV systolic function  . CARDIAC CATHETERIZATION Bilateral 04/26/2007   Normal findings, recommend medical therapy  . CARDIAC CATHETERIZATION  02/18/2008   Moderate CAD, would  benefit from having a functional study, recommend continue medical therapy  . CARDIAC CATHETERIZATION  07/23/2012   Medical therapy  . CARDIAC CATHETERIZATION N/A 11/24/2014   Procedure: Left Heart Cath and Coronary Angiography;  Surgeon: Troy Sine, MD;  Location: [place] CV LAB;  Service: Cardiovascular;  Laterality: N/A;  . CARDIAC CATHETERIZATION  11/27/2014   Procedure: Intravascular Pressure Wire/FFR Study;  Surgeon: Peter M Martinique, MD;  Location: [place] CV LAB;  Service: Cardiovascular;;  . CARDIAC CATHETERIZATION  10/09/2016  . CHOLECYSTECTOMY N/A 04/09/2018   Procedure: LAPAROSCOPIC CHOLECYSTECTOMY;  Surgeon: Greer Pickerel, MD;  [facility];  Service: General;  Laterality: N/A;  . COLONOSCOPY WITH PROPOFOL N/A 12/29/2017   Procedure: COLONOSCOPY WITH PROPOFOL;  Surgeon: [name], MD;  [facility];  Service: Endoscopy;  Laterality: N/A;  . CORONARY ANGIOGRAM  2010  . CORONARY ARTERY BYPASS GRAFT N/A 11/29/2014   Procedure: CORONARY ARTERY BYPASS GRAFTING x 5 (LIMA-LAD, SVG-D, SVG-OM1-OM2,  SVG-PD);  Surgeon: Melrose Nakayama, MD;  Location: [place];  Service: Open Heart Surgery;  Laterality: N/A;  . CORONARY STENT INTERVENTION N/A 10/31/2016   Procedure: CORONARY STENT INTERVENTION;  Surgeon: Burnell Blanks, MD;  Location: [place] CV LAB;  Service: Cardiovascular;  Laterality: N/A;  . ESOPHAGOGASTRODUODENOSCOPY (EGD) WITH PROPOFOL N/A 12/29/2017   Procedure: ESOPHAGOGASTRODUODENOSCOPY (EGD) WITH PROPOFOL;  Surgeon: [name], MD;  [facility];  Service: Endoscopy;  Laterality: N/A;  . FRACTURE SURGERY    . INSERT / REPLACE / [place]  . KNEE ARTHROSCOPY Bilateral   . LEFT HEART CATH AND CORS/GRAFTS ANGIOGRAPHY N/A 12/09/2017   Procedure: LEFT HEART CATH AND CORS/GRAFTS ANGIOGRAPHY;  Surgeon: Troy Sine, MD;  Location: [place] CV LAB;  Service: Cardiovascular;  Laterality: N/A;  . LEFT HEART  CATHETERIZATION WITH CORONARY ANGIOGRAM N/A 07/23/2012   Procedure: LEFT HEART CATHETERIZATION WITH CORONARY ANGIOGRAM;  Surgeon: Leonie Man, MD;  Location: [facility] C[facility] LAB;  Service: Cardiovascular;  Laterality: N/A;  Carlton Adam MYOVIEW  11/14/2011   Mild-moderate defect seen in Mid Inferolateral and Mid Anterolateral regions-consistant w/ infarct/scar. No significant ischemia demonstrated.  [name] POLYPECTOMY  12/29/2017   Procedure: POLYPECTOMY;  Surgeon: [name], MD;  [facility];  Service: Endoscopy;;  . RIGHT/LEFT HEART CATH AND CORONARY ANGIOGRAPHY N/A 10/09/2016   Procedure: RIGHT/LEFT HEART CATH AND CORONARY ANGIOGRAPHY;  Surgeon: Jolaine Artist, MD;  Location: [place] CV LAB;  Service: Cardiovascular;  Laterality: N/A;  . TEE WITHOUT CARDIOVERSION N/A 11/29/2014   Procedure: TRANSESOPHAGEAL ECHOCARDIOGRAM (TEE);  Surgeon: Melrose Nakayama, MD;  Location: [place];  Service: Open Heart Surgery;  Laterality: N/A;  . TRANSTHORACIC ECHOCARDIOGRAM  07/23/2012   EF 55-60%, normal-mild  . TUBAL LIGATION       Current Outpatient Medications  Medication Sig Dispense Refill  . acetaminophen (TYLENOL) 500 MG tablet Take 500 mg by mouth daily.     [name] albuterol (PROVENTIL HFA;VENTOLIN HFA) 108 (90 Base) MCG/ACT inhaler Inhale 2 puffs into the lungs every 6 (six) hours as needed for wheezing or shortness of breath. 1 Inhaler 0  . albuterol (PROVENTIL) (2.5 MG/3ML) 0.083% nebulizer solution Take 3 mLs (2.5 mg total) by nebulization every 6 (six) hours as needed for wheezing or shortness of breath. 75 mL 0  . aspirin EC 81 MG tablet Take 1 tablet (81 mg total) by mouth daily. 90 tablet 3  . bisoprolol (ZEBETA) 5 MG tablet TAKE 1 TABLET EVERY DAY 90 tablet 2  . budesonide-formoterol (SYMBICORT) 160-4.5 MCG/ACT inhaler Inhale 2 puffs into the lungs 2 (two) times daily.    . cetirizine (ZYRTEC) 10 MG tablet Take 10 mg by mouth daily.     . cholestyramine light (PREVALITE) 4 g  packet Take 1 packet (4 g total) by mouth 2 (two) times daily. 60 packet 11  . clopidogrel (PLAVIX) 75 MG tablet Take 1 tablet (75 mg total) by mouth daily. 90 tablet 3  . clotrimazole (LOTRIMIN) 1 % cream Apply 1 application topically 2 (two) times daily as needed (rash).    . clotrimazole (MYCELEX) 10 MG troche Take 10 mg by mouth daily as needed ([place]ore [place]outh).     . colchicine 0.6 MG tablet Take 0.6 mg by mouth daily.     . diclofenac sodium (VOLTAREN) 1 % GEL Apply 2 g topically 4 (four) times daily. 1 Tube 0  . diphenhydrAMINE (BENADRYL) 25 MG tablet Take 25 mg by mouth every 6 (  six) hours as needed for allergies.    [name] docusate sodium (COLACE) 100 MG capsule Take 200 mg by mouth at bedtime.     [name] EPINEPHrine (EPIPEN 2-PAK) 0.3 mg/0.3 mL IJ SOAJ injection Inject 0.3 mg into the muscle once as needed (severe allergic reaction).    . febuxostat (ULORIC) 40 MG tablet Take 40 mg by mouth daily.  3  . Glucosamine HCl (GLUCOSAMINE PO) Take by mouth daily.    [name] guaiFENesin (MUCINEX) 600 MG 12 hr tablet Take 600 mg by mouth at bedtime as needed for cough or to loosen phlegm.     . hydrocortisone 2.5 % cream Apply 2.5 application topically daily as needed (itching).   1  . hydroxypropyl methylcellulose / hypromellose (ISOPTO TEARS / GONIOVISC) 2.5 % ophthalmic solution Place 1 drop into both eyes 3 (three) times daily.     [name] ipratropium (ATROVENT) 0.02 % nebulizer solution Take 2.5 mLs (0.5 mg total) by nebulization 4 (four) times daily. 75 mL 0  . ipratropium (ATROVENT) 0.06 % nasal spray 1-2 pffs up to 4 x daily as needed (Patient taking differently: Place 2 sprays into both nostrils 4 (four) times daily as needed for rhinitis. ) 15 mL 12  . isosorbide mononitrate (IMDUR) 60 MG 24 hr tablet TAKE 1 TABLET EVERY DAY 90 tablet 3  . metolazone (ZAROXOLYN) 5 MG tablet Take 1 tablet (5 mg total) by mouth daily as needed (for weight gain of 3 LBS. or more).    . Multiple Vitamins-Minerals (ALIVE WOMENS ENERGY  PO) Take 1 tablet by mouth daily.    . nitroGLYCERIN (NITROSTAT) 0.4 MG SL tablet Place 1 tablet (0.4 mg total) under the tongue every 5 (five) minutes as needed for chest pain. 25 tablet 3  . ondansetron (ZOFRAN ODT) 4 MG disintegrating tablet Take 1 tablet (4 mg total) by mouth every 8 (eight) hours as needed for nausea or vomiting. 20 tablet 0  . OXYGEN Place 2-3 L into the nose See admin instructions. 2 lpm with sleep and 3 with exertion- [facility]    . pantoprazole (PROTONIX) 40 MG tablet Take 1 tablet (40 mg total) by mouth 2 (two) times daily. 90 tablet 1  . potassium chloride SA (K-DUR,KLOR-CON) 20 MEQ tablet Take 1 tablet (20 mEq total) by mouth daily. 90 tablet 3  . Probiotic Product (PROBIOTIC PO) Take by mouth daily.    . RESTASIS 0.05 % ophthalmic emulsion Place 1 drop into both eyes 2 (two) times daily.   11  . rosuvastatin (CRESTOR) 20 MG tablet TAKE 1 TABLET AT BEDTIME 90 tablet 1  . saxagliptin HCl (ONGLYZA) 2.5 MG TABS tablet Take 2.5 mg by mouth daily.     [name] spironolactone (ALDACTONE) 25 MG tablet Take 1 tablet (25 mg total) by mouth at bedtime.    . torsemide (DEMADEX) 20 MG tablet Take 1-2 tablets (20-40 mg total) by mouth See admin instructions. Take 40 mg by mouth in the morning and 20 mg in the evening    . triamcinolone cream (KENALOG) 0.1 % Apply 1 application topically 2 (two) times daily as needed (itching).      No current facility-administered medications for this visit.     Allergies:   Ivp dye [iodinated diagnostic agents], Shellfish allergy, Shellfish-derived products, Sulfa antibiotics, Iodine, Atorvastatin, Doxycycline, Cephalexin, and Zithromax [azithromycin]    Social History:  The patient  reports that she quit smoking about 50 years ago. Her smoking use included cigarettes. She has a 0.75 pack-year smoking  history. She has never used smokeless tobacco. She reports that she does not drink alcohol or use drugs.   Family History:  The patient's family history  includes Asthma in her sister; Breast cancer in her mother; Diabetes in her maternal grandmother and mother; Glaucoma in her maternal aunt; Heart disease in her maternal aunt and maternal grandmother; Kidney disease in her maternal grandmother.    ROS: All other systems are reviewed and negative. Unless otherwise mentioned in H&P    PHYSICAL EXAM: VS:  BP (!) 100/58   Pulse 68   Temp (!) 97.2 F (36.2 C)   Ht 5\' 6"  (1.676 m)   Wt 259 lb (117.5 kg)   SpO2 92%   BMI 41.80 kg/m  , BMI Body mass index is 41.8 kg/m. GEN: Well nourished, well developed, in no acute distress.  Morbidly obese  HEENT: normal Neck: no JVD, carotid bruits, or masses Cardiac: RRR; distant heart sounds no murmurs, rubs, or gallops, 2+ pretibial and to the knees edema  Respiratory:  Clear to auscultation bilaterally, normal work of breathing wearing oxygen via nasal cannula, some inspiratory wheezes are noted. GI: soft, nontender, nondistended, + BS MS: no deformity or atrophy Skin: warm and dry, no rash Neuro:  Strength and sensation are intact Psych: euthymic mood, full affect   EKG: Not completed during this office visit  Recent Labs: 05/01/2018: ALT 35; B Natriuretic Peptide 142.7; BUN 18; Creatinine, Ser 1.18; Hemoglobin 12.6; Platelets 111; Potassium 3.8; Sodium 138    Lipid Panel    Component Value Date/Time   CHOL 166 10/31/2016 0314   TRIG 181 (H) 10/31/2016 0314   HDL 39 (L) 10/31/2016 0314   CHOLHDL 4.3 10/31/2016 0314   VLDL 36 10/31/2016 0314   LDLCALC 91 10/31/2016 0314      Wt Readings from Last 3 Encounters:  09/28/18 259 lb (117.5 kg)  07/21/18 247 lb (112 kg)  06/09/18 237 lb (107.5 kg)      Other studies Reviewed: Device Check 08/17/2018 Notes recorded by Sanda Klein, MD on 08/22/2018 at 4:32 PM EDT  Remote reviewed.   Complete heart block: pacemaker dependent. 99% BiV paced.  Battery status is good.  Lead measurements are stable.  Heart rate histogram is favorable.   No clinically significant episodes of high ventricular rate or atrial mode switch noted.   ASSESSMENT AND PLAN:  1.  Coronary artery disease:.  History of most recent cardiac catheterization in August 2018, which revealed patent SVG to LAD SVG to diagonal and SVG to RCA with SVG to left circumflex occluded with normal LVEF.  She had a follow-up left heart cath in 2018 with drug-eluting stent to the AV groove circumflex and drug-eluting stent to the intermittent branch.  She denies any chest pain she has chronic shortness of breath.  No changes in her medication regimen.  2.  Diastolic CHF: The patient is run out of metolazone and has continued to eat high salt high cholesterol foods.  She is given a refill on metolazone 2.5 mg.  She may take 1 dose to see if this helps with her fluid retention she is to change to take her torsemide on an empty stomach for better bioavailability.  BMET is ordered.  3.  Generalized fatigue: I believe this is related to her weight and deconditioning.  She may need to consider physical therapy or other forms of exercise in order to increase her stamina.  Weight loss is greatly encouraged, she will have to stop eating  fast foods and high salt foods.  This is been explained to the patient who verbalizes understanding.  4.  Oxygen dependent; remains on O2 via nasal cannula.  She has not seen pulmonology, Dr. Melvyn Novas, in several months.  I have advised her to follow-up with him concerning her oxygen demands and need for changes in medications.  She verbalizes understanding  5.  Status post cholecystectomy: She is having significant GI issues since having procedure done with frequent abdominal pain and diarrhea.  I have advised her to follow-up with GI as she has not seen them since plan to have cholecystectomy.  She verbalizes understanding    Current medicines are reviewed at length with the patient today.    Labs/ tests ordered today include: BMET, vitamin D  Phill Myron.  [place] Pugh, ANP, AACC   09/28/2018 11:20 AM    [facility] [place] [address] Office [phone] Fax [phone]

## 2018-09-28 NOTE — Patient Instructions (Signed)
Medication Instructions:  Continue current medications  If you need a refill on your cardiac medications before your next appointment, please call your pharmacy.  Labwork: BMP and Vit D HERE IN OUR OFFICE AT [facility]  You will NOT need to fast   Take the provided lab slips with you to the lab for your blood draw.   When you have your labs (blood work) drawn today and your tests are completely normal, you will receive your results only by MyChart Message (if you have MyChart) -OR-  A paper copy in the mail.  If you have any lab test that is abnormal or we need to change your treatment, we will call you to review these results.  Testing/Procedures: None Ordered  Follow-Up: You will need a follow up appointment in 3 months.  Please call our office 2 months in advance to schedule this appointment.  You may see Sanda Klein, MD or one of the following Advanced Practice Providers on your designated Care Team: [place], [place] . Fabian Sharp, PA-C     At [facility], you and your health needs are our priority.  As part of our continuing mission to provide you with exceptional heart care, we have created designated Provider Care Teams.  These Care Teams include your primary Cardiologist (physician) and Advanced Practice Providers (APPs -  Physician Assistants and Nurse Practitioners) who all work together to provide you with the care you need, when you need it.  Thank you for choosing [facility] at [facility]!!

## 2018-09-29 LAB — BASIC METABOLIC PANEL
BUN/Creatinine Ratio: 19 (ref 12–28)
BUN: 26 mg/dL (ref 8–27)
CO2: 35 mmol/L — ABNORMAL HIGH (ref 20–29)
Calcium: 9 mg/dL (ref 8.7–10.3)
Chloride: 96 mmol/L (ref 96–106)
Creatinine, Ser: 1.34 mg/dL — ABNORMAL HIGH (ref 0.57–1.00)
GFR calc Af Amer: 45 mL/min/{1.73_m2} — ABNORMAL LOW (ref >59)
GFR calc non Af Amer: 39 mL/min/{1.73_m2} — ABNORMAL LOW (ref >59)
Glucose: 119 mg/dL — ABNORMAL HIGH (ref 65–99)
Potassium: 4.6 mmol/L (ref 3.5–5.2)
Sodium: 143 mmol/L (ref 134–144)

## 2018-09-29 LAB — VITAMIN D 25 HYDROXY (VIT D DEFICIENCY, FRACTURES): Vit D, 25-Hydroxy: 13.7 ng/mL — ABNORMAL LOW (ref 30.0–100.0)

## 2018-09-30 ENCOUNTER — Telehealth: Payer: Self-pay | Admitting: Gastroenterology

## 2018-09-30 ENCOUNTER — Telehealth: Payer: Self-pay | Admitting: *Deleted

## 2018-09-30 DIAGNOSIS — I5032 Chronic diastolic (congestive) heart failure: Secondary | ICD-10-CM | POA: Diagnosis not present

## 2018-09-30 DIAGNOSIS — J4520 Mild intermittent asthma, uncomplicated: Secondary | ICD-10-CM | POA: Diagnosis not present

## 2018-09-30 DIAGNOSIS — J4530 Mild persistent asthma, uncomplicated: Secondary | ICD-10-CM | POA: Diagnosis not present

## 2018-09-30 DIAGNOSIS — R269 Unspecified abnormalities of gait and mobility: Secondary | ICD-10-CM | POA: Diagnosis not present

## 2018-09-30 MED ORDER — VITAMIN D (ERGOCALCIFEROL) 1.25 MG (50000 UNIT) PO CAPS: 50000.0000 [IU] | ORAL_CAPSULE | ORAL | 0 refills | Status: DC

## 2018-09-30 NOTE — Telephone Encounter (Signed)
Pt aware of her blood work, Vit D 50,000 units send into pt [employer]

## 2018-09-30 NOTE — Telephone Encounter (Signed)
Pt requested a call back regarding "her condition"

## 2018-09-30 NOTE — Telephone Encounter (Signed)
-----   Message from Lendon Colonel, NP sent at 09/29/2018 10:15 AM EDT ----- Mrs. [name] Vitamin D is very low. Will need to supplement with Vit D 50000 units, once a week for 12 weeks. Take with food. Follow up with PCP for more recommendations.

## 2018-10-01 DIAGNOSIS — J4520 Mild intermittent asthma, uncomplicated: Secondary | ICD-10-CM | POA: Diagnosis not present

## 2018-10-01 DIAGNOSIS — E1122 Type 2 diabetes mellitus with diabetic chronic kidney disease: Secondary | ICD-10-CM | POA: Diagnosis not present

## 2018-10-01 DIAGNOSIS — I5032 Chronic diastolic (congestive) heart failure: Secondary | ICD-10-CM | POA: Diagnosis not present

## 2018-10-01 DIAGNOSIS — N183 Chronic kidney disease, stage 3 (moderate): Secondary | ICD-10-CM | POA: Diagnosis not present

## 2018-10-01 DIAGNOSIS — I10 Essential (primary) hypertension: Secondary | ICD-10-CM | POA: Diagnosis not present

## 2018-10-01 DIAGNOSIS — I2510 Atherosclerotic heart disease of native coronary artery without angina pectoris: Secondary | ICD-10-CM | POA: Diagnosis not present

## 2018-10-01 DIAGNOSIS — I5033 Acute on chronic diastolic (congestive) heart failure: Secondary | ICD-10-CM | POA: Diagnosis not present

## 2018-10-01 DIAGNOSIS — E7800 Pure hypercholesterolemia, unspecified: Secondary | ICD-10-CM | POA: Diagnosis not present

## 2018-10-01 DIAGNOSIS — E1169 Type 2 diabetes mellitus with other specified complication: Secondary | ICD-10-CM | POA: Diagnosis not present

## 2018-10-01 NOTE — Telephone Encounter (Signed)
Spoke with the patient. She wants to be seen sooner if possible. Her appointment is 10/07/18 with Alonza Bogus, PA. Checked schedule. No sooner openings at this point. I will continue to monitor the schedule. She needs a 24 hour notice.

## 2018-10-06 DIAGNOSIS — R062 Wheezing: Secondary | ICD-10-CM | POA: Diagnosis not present

## 2018-10-06 DIAGNOSIS — J9611 Chronic respiratory failure with hypoxia: Secondary | ICD-10-CM | POA: Diagnosis not present

## 2018-10-06 DIAGNOSIS — R269 Unspecified abnormalities of gait and mobility: Secondary | ICD-10-CM | POA: Diagnosis not present

## 2018-10-06 DIAGNOSIS — I5032 Chronic diastolic (congestive) heart failure: Secondary | ICD-10-CM | POA: Diagnosis not present

## 2018-10-07 ENCOUNTER — Ambulatory Visit (INDEPENDENT_AMBULATORY_CARE_PROVIDER_SITE_OTHER): Payer: Medicare HMO | Admitting: Gastroenterology

## 2018-10-07 ENCOUNTER — Encounter: Payer: Self-pay | Admitting: Gastroenterology

## 2018-10-07 VITALS — BP 122/62 | HR 73 | Temp 97.6°F | Ht 62.0 in | Wt 252.0 lb

## 2018-10-07 DIAGNOSIS — R1013 Epigastric pain: Secondary | ICD-10-CM

## 2018-10-07 DIAGNOSIS — K219 Gastro-esophageal reflux disease without esophagitis: Secondary | ICD-10-CM | POA: Diagnosis not present

## 2018-10-07 DIAGNOSIS — K5900 Constipation, unspecified: Secondary | ICD-10-CM | POA: Diagnosis not present

## 2018-10-07 MED ORDER — PANTOPRAZOLE SODIUM 40 MG PO TBEC: 40.0000 mg | DELAYED_RELEASE_TABLET | Freq: Every day | ORAL | 3 refills | Status: DC

## 2018-10-07 NOTE — Patient Instructions (Addendum)
If you are age 74 or older, your body mass index should be between 23-30. Your Body mass index is 46.09 kg/m. If this is out of the aforementioned range listed, please consider follow up with your Primary Care Provider.  If you are age 6 or younger, your body mass index should be between 19-25. Your Body mass index is 46.09 kg/m. If this is out of the aformentioned range listed, please consider follow up with your Primary Care Provider.    We have sent the following medications to your pharmacy for you to pick up at your convenience:  Pantoprazole 40 mg twice a day #60 with 2 refills Take Mirilax  daily 1 to 2 times daily.  Follow up on November 11, 2018 @9 :30 am with Dr Silverio Decamp  Thank you for choosing me and [facility]   Alonza Bogus, PA-C

## 2018-10-07 NOTE — Progress Notes (Signed)
10/07/2018 [name] [id number] 07/09/1945   HISTORY OF PRESENT ILLNESS: This is a 73 year old female who is a patient of Dr. Leonia Corona.  Has multiple medical problems as listed below and is on oxygen therapy.  Also on Plavix daily.  She presents to our office today with complaints of abdominal pain mostly in her upper abdomen.  She had her gallbladder out in February of this year and says that she has been having pain since that time.  She is concerned that it is related to a complication from that.  Denies nausea, vomiting, fever, chills.  She is on pantoprazole 40 mg daily for reflux related issues.  She also reports issues with constipation, not currently taking anything.  Had both EGD and colonoscopy in November 2019.  EGD revealed grade B esophagitis, small hiatal hernia, gastritis, and a small nodule in the stomach.  Gastric biopsies showed no H. pylori.  Showed reactive gastropathy with ulceration.  Colonoscopy revealed polyps that were removed and were tubular adenomas.  Also had diverticulosis and internal/external hemorrhoids.   Past Medical History:  Diagnosis Date  . AICD (automatic cardioverter/defibrillator) present   . Anemia   . Arthritis   . Asthma   . CAD (coronary artery disease)    a. 10/09/16 LHC: SVG->LAD patent, SVG->Diag patent, SVG->RCA patent, SVG->LCx occluded. EF 60%, b. 10/31/16 LHC DES to AV groove Circ, DES to intermed branch  . Cellulitis and abscess of foot 12/2014   RT FOOT  . CHF (congestive heart failure) ([place])   . Chronic bronchitis ([place])   . Chronic lower back pain   . Diverticulosis   . Facial numbness 02/2016  . Fatty liver disease, nonalcoholic   . Gastritis   . GERD (gastroesophageal reflux disease)   . Gout   . H/O hiatal hernia   . High cholesterol   . Hyperplastic colon polyp   . Hypertension   . IBS (irritable bowel syndrome)   . Internal hemorrhoids   . Ischemic cardiomyopathy   . Obesity   . On home oxygen therapy    "2L at night" (10/31/2016)  . OSA (obstructive sleep apnea)    "can't tolerate a mask" (10/30/2016)  . Pneumonia    "couple times" (10/31/2016)  . Shortness of breath   . TIA (transient ischemic attack)    "recently" (10/31/2016)  . Type II diabetes mellitus ([place])    Past Surgical History:  Procedure Laterality Date  . ABDOMINAL ULTRASOUND  12/01/2011   Peripelvic cysts- #1- 2.4x1.9x2.3cm, #2-1.2x0.9x1.2cm  . ANKLE FRACTURE SURGERY Right    "had rod put in"  . ANTERIOR CERVICAL DECOMP/DISCECTOMY FUSION    . APPENDECTOMY    . BACK SURGERY    . BIOPSY  12/29/2017   Procedure: BIOPSY;  Surgeon: [name], MD;  Location: [facility];  Service: Endoscopy;;  . BIV ICD INSERTION CRT-D  2001?  [name] BIV PACEMAKER GENERATOR CHANGE OUT N/A 11/02/2012   Procedure: BIV PACEMAKER GENERATOR CHANGE OUT;  Surgeon: Sanda Klein, MD;  Location: [place];  Service: Cardiovascular;  Laterality: N/A;  . CARDIAC CATHETERIZATION  05/17/1999   No significant coronary obstructive disease w/ mild 20% luminal irregularity of the first diag branch of the LAD  . CARDIAC CATHETERIZATION  07/08/2002   No significant CAD, moderately depressed LV systolic function  . CARDIAC CATHETERIZATION Bilateral 04/26/2007   Normal findings, recommend medical therapy  . CARDIAC CATHETERIZATION  02/18/2008   Moderate CAD, would benefit from having a functional study,  recommend continue medical therapy  . CARDIAC CATHETERIZATION  07/23/2012   Medical therapy  . CARDIAC CATHETERIZATION N/A 11/24/2014   Procedure: Left Heart Cath and Coronary Angiography;  Surgeon: Troy Sine, MD;  Location: [place] [facility];  Service: Cardiovascular;  Laterality: N/A;  . CARDIAC CATHETERIZATION  11/27/2014   Procedure: Intravascular Pressure Wire/FFR Study;  Surgeon: Peter M Martinique, MD;  Location: [place] [facility];  Service: Cardiovascular;;  . CARDIAC CATHETERIZATION  10/09/2016  . CHOLECYSTECTOMY N/A 04/09/2018   Procedure:  LAPAROSCOPIC CHOLECYSTECTOMY;  Surgeon: Greer Pickerel, MD;  Location: [facility];  Service: General;  Laterality: N/A;  . COLONOSCOPY WITH PROPOFOL N/A 12/29/2017   Procedure: COLONOSCOPY WITH PROPOFOL;  Surgeon: [name], MD;  Location: [facility];  Service: Endoscopy;  Laterality: N/A;  . CORONARY ANGIOGRAM  2010  . CORONARY ARTERY BYPASS GRAFT N/A 11/29/2014   Procedure: CORONARY ARTERY BYPASS GRAFTING x 5 (LIMA-LAD, SVG-D, SVG-OM1-OM2, SVG-PD);  Surgeon: Melrose Nakayama, MD;  Location: [place] [facility];  Service: Open Heart Surgery;  Laterality: N/A;  . CORONARY STENT INTERVENTION N/A 10/31/2016   Procedure: CORONARY STENT INTERVENTION;  Surgeon: Burnell Blanks, MD;  Location: [place] [facility];  Service: Cardiovascular;  Laterality: N/A;  . ESOPHAGOGASTRODUODENOSCOPY (EGD) WITH PROPOFOL N/A 12/29/2017   Procedure: ESOPHAGOGASTRODUODENOSCOPY (EGD) WITH PROPOFOL;  Surgeon: [name], MD;  [facility]: [facility];  Service: Endoscopy;  Laterality: N/A;  . FRACTURE SURGERY    . INSERT / REPLACE / [place]  . KNEE ARTHROSCOPY Bilateral   . LEFT HEART CATH AND CORS/GRAFTS ANGIOGRAPHY N/A 12/09/2017   Procedure: LEFT HEART CATH AND CORS/GRAFTS ANGIOGRAPHY;  Surgeon: Troy Sine, MD;  Location: [place] [facility];  Service: Cardiovascular;  Laterality: N/A;  . LEFT HEART CATHETERIZATION WITH CORONARY ANGIOGRAM N/A 07/23/2012   Procedure: LEFT HEART CATHETERIZATION WITH CORONARY ANGIOGRAM;  Surgeon: Leonie Man, MD;  Location: [facility] CATH [facility];  Service: Cardiovascular;  Laterality: N/A;  Carlton Adam MYOVIEW  11/14/2011   Mild-moderate defect seen in Mid Inferolateral and Mid Anterolateral regions-consistant w/ infarct/scar. No significant ischemia demonstrated.  [name] POLYPECTOMY  12/29/2017   Procedure: POLYPECTOMY;  Surgeon: [name], MD;  Location: [facility];  Service: Endoscopy;;  . RIGHT/LEFT HEART CATH AND CORONARY ANGIOGRAPHY N/A 10/09/2016    Procedure: RIGHT/LEFT HEART CATH AND CORONARY ANGIOGRAPHY;  Surgeon: Jolaine Artist, MD;  Location: [place] [facility];  Service: Cardiovascular;  Laterality: N/A;  . TEE WITHOUT CARDIOVERSION N/A 11/29/2014   Procedure: TRANSESOPHAGEAL ECHOCARDIOGRAM (TEE);  Surgeon: Melrose Nakayama, MD;  Location: [place];  Service: Open Heart Surgery;  Laterality: N/A;  . TRANSTHORACIC ECHOCARDIOGRAM  07/23/2012   EF 55-60%, normal-mild  . TUBAL LIGATION      reports that she quit smoking about 50 years ago. Her smoking use included cigarettes. She has a 0.75 pack-year smoking history. She has never used smokeless tobacco. She reports that she does not drink alcohol or use drugs. family history includes Asthma in her sister; Breast cancer in her mother; Diabetes in her maternal grandmother and mother; Glaucoma in her maternal aunt; Heart disease in her maternal aunt and maternal grandmother; Kidney disease in her maternal grandmother. Allergies  Allergen Reactions  . Ivp Dye [Iodinated Diagnostic Agents] Shortness Of Breath    No reaction to PO contrast with non-ionic dye.06-25-2014/rsm  . Shellfish Allergy Anaphylaxis  . Shellfish-Derived Products Anaphylaxis  . Sulfa Antibiotics Shortness Of Breath  . Iodine Hives  . Atorvastatin Other (  See Comments)    Pt states "causes bilateral leg pain/cramps."  . Doxycycline Other (See Comments)    Unknown rxn per pt  . Cephalexin Itching and Rash  . Zithromax [Azithromycin] Rash      Outpatient Encounter Medications as of 10/07/2018  Medication Sig  . acetaminophen (TYLENOL) 500 MG tablet Take 500 mg by mouth daily.   [name] albuterol (PROVENTIL HFA;VENTOLIN HFA) 108 (90 Base) MCG/ACT inhaler Inhale 2 puffs into the lungs every 6 (six) hours as needed for wheezing or shortness of breath.  [name] albuterol (PROVENTIL) (2.5 MG/3ML) 0.083% nebulizer solution Take 3 mLs (2.5 mg total) by nebulization every 6 (six) hours as needed for wheezing or shortness of breath.   [name] aspirin EC 81 MG tablet Take 1 tablet (81 mg total) by mouth daily.  . bisoprolol (ZEBETA) 5 MG tablet TAKE 1 TABLET EVERY DAY  . budesonide-formoterol (SYMBICORT) 160-4.5 MCG/ACT inhaler Inhale 2 puffs into the lungs 2 (two) times daily.  . cetirizine (ZYRTEC) 10 MG tablet Take 10 mg by mouth daily.   . clopidogrel (PLAVIX) 75 MG tablet Take 1 tablet (75 mg total) by mouth daily.  . clotrimazole (LOTRIMIN) 1 % cream Apply 1 application topically 2 (two) times daily as needed (rash).  . clotrimazole (MYCELEX) 10 MG troche Take 10 mg by mouth daily as needed (Sore Mouth).   . colchicine 0.6 MG tablet Take 0.6 mg by mouth daily.   . diclofenac sodium (VOLTAREN) 1 % GEL Apply 2 g topically 4 (four) times daily.  . diphenhydrAMINE (BENADRYL) 25 MG tablet Take 25 mg by mouth every 6 (six) hours as needed for allergies.  [name] docusate sodium (COLACE) 100 MG capsule Take 200 mg by mouth at bedtime.   [name] EPINEPHrine (EPIPEN 2-PAK) 0.3 mg/0.3 mL IJ SOAJ injection Inject 0.3 mg into the muscle once as needed (severe allergic reaction).  . febuxostat (ULORIC) 40 MG tablet Take 40 mg by mouth daily.  . Glucosamine HCl (GLUCOSAMINE PO) Take by mouth daily.  [name] guaiFENesin (MUCINEX) 600 MG 12 hr tablet Take 600 mg by mouth at bedtime as needed for cough or to loosen phlegm.   . hydrocortisone 2.5 % cream Apply 2.5 application topically daily as needed (itching).   . hydroxypropyl methylcellulose / hypromellose (ISOPTO TEARS / GONIOVISC) 2.5 % ophthalmic solution Place 1 drop into both eyes 3 (three) times daily.   [name] ipratropium (ATROVENT) 0.02 % nebulizer solution Take 2.5 mLs (0.5 mg total) by nebulization 4 (four) times daily.  . isosorbide mononitrate (IMDUR) 60 MG 24 hr tablet TAKE 1 TABLET EVERY DAY  . metolazone (ZAROXOLYN) 5 MG tablet Take 1 tablet (5 mg total) by mouth daily as needed (for weight gain of 3 LBS. or more).  . Multiple Vitamins-Minerals (ALIVE WOMENS ENERGY PO) Take 1 tablet by mouth daily.   . nitroGLYCERIN (NITROSTAT) 0.4 MG SL tablet Place 1 tablet (0.4 mg total) under the tongue every 5 (five) minutes as needed for chest pain.  [name] ondansetron (ZOFRAN ODT) 4 MG disintegrating tablet Take 1 tablet (4 mg total) by mouth every 8 (eight) hours as needed for nausea or vomiting.  . OXYGEN Place 2-3 L into the nose See admin instructions. 2 lpm with sleep and 3 with exertion- [facility]  . pantoprazole (PROTONIX) 40 MG tablet Take 1 tablet (40 mg total) by mouth 2 (two) times daily.  . potassium chloride SA (K-DUR,KLOR-CON) 20 MEQ tablet Take 1 tablet (20 mEq total) by mouth daily.  . Probiotic Product (  PROBIOTIC PO) Take by mouth daily.  . RESTASIS 0.05 % ophthalmic emulsion Place 1 drop into both eyes 2 (two) times daily.   . rosuvastatin (CRESTOR) 20 MG tablet TAKE 1 TABLET AT BEDTIME  . saxagliptin HCl (ONGLYZA) 2.5 MG TABS tablet Take 2.5 mg by mouth daily.   [name] spironolactone (ALDACTONE) 25 MG tablet Take 1 tablet (25 mg total) by mouth at bedtime.  . torsemide (DEMADEX) 20 MG tablet Take 1-2 tablets (20-40 mg total) by mouth See admin instructions. Take 40 mg by mouth in the morning and 20 mg in the evening  . triamcinolone cream (KENALOG) 0.1 % Apply 1 application topically 2 (two) times daily as needed (itching).   . Vitamin D, Ergocalciferol, (DRISDOL) 1.25 MG (50000 UT) CAPS capsule Take 1 capsule (50,000 Units total) by mouth every 7 (seven) days.  . [DISCONTINUED] cholestyramine light (PREVALITE) 4 g packet Take 1 packet (4 g total) by mouth 2 (two) times daily.  . [DISCONTINUED] ipratropium (ATROVENT) 0.06 % nasal spray 1-2 pffs up to 4 x daily as needed (Patient taking differently: Place 2 sprays into both nostrils 4 (four) times daily as needed for rhinitis. )   No facility-administered encounter medications on file as of 10/07/2018.      REVIEW OF SYSTEMS  : All other systems reviewed and negative except where noted in the History of Present Illness.   PHYSICAL EXAM: BP  122/62   Pulse 73   Temp 97.6 F (36.4 C)   Ht 5\' 2"  (1.575 m) Comment: w/o shoes  Wt 252 lb (114.3 kg)   BMI 46.09 kg/m  General: Well developed white female in no acute distress Head: Normocephalic and atraumatic Eyes:  Sclerae anicteric, conjunctiva pink. Ears: Normal auditory acuity Lungs: Clear throughout to auscultation; no increased WOB. Heart: Regular rate and rhythm; no M/R/G. Abdomen: Soft, non-distended.  BS present.  Mild diffuse TTP. Musculoskeletal: Symmetrical with no gross deformities  Skin: No lesions on visible extremities Extremities: No edema  Neurological: Alert oriented x 4, grossly non-focal Psychological:  Alert and cooperative. Normal mood and affect  ASSESSMENT AND PLAN: *73 year old female with multiple medical problems as listed above, on O2.  Presenting with complaints of abdominal pain since she had her cholecystectomy in February of this year as well as complaints of constipation.  She is concerned that her pain is related to a complication her surgery, however, I am not convinced that is the case.  I think that this may be just secondary to reflux or gastritis.  She is on pantoprazole 40 mg daily.  I am going to increase this to twice a day, new prescription sent.  To treat the constipation I have asked her to begin taking MiraLAX daily and increase to twice daily if needed.  She will follow-up in 4 to 6 weeks.   CC:  Pa, [facility]*

## 2018-10-08 DIAGNOSIS — R51 Headache: Secondary | ICD-10-CM | POA: Diagnosis not present

## 2018-10-08 DIAGNOSIS — Z23 Encounter for immunization: Secondary | ICD-10-CM | POA: Diagnosis not present

## 2018-10-08 DIAGNOSIS — J019 Acute sinusitis, unspecified: Secondary | ICD-10-CM | POA: Diagnosis not present

## 2018-10-10 ENCOUNTER — Other Ambulatory Visit: Payer: Self-pay

## 2018-10-10 ENCOUNTER — Emergency Department (HOSPITAL_COMMUNITY): Payer: Medicare HMO

## 2018-10-10 ENCOUNTER — Emergency Department (HOSPITAL_COMMUNITY)
Admission: EM | Admit: 2018-10-10 | Discharge: 2018-10-10 | Disposition: A | Discharge status: Home / Self Care | Payer: Medicare HMO | Attending: Emergency Medicine | Admitting: Emergency Medicine

## 2018-10-10 ENCOUNTER — Encounter (HOSPITAL_COMMUNITY): Payer: Self-pay | Admitting: *Deleted

## 2018-10-10 DIAGNOSIS — I5032 Chronic diastolic (congestive) heart failure: Secondary | ICD-10-CM | POA: Diagnosis not present

## 2018-10-10 DIAGNOSIS — Z951 Presence of aortocoronary bypass graft: Secondary | ICD-10-CM | POA: Diagnosis not present

## 2018-10-10 DIAGNOSIS — M79602 Pain in left arm: Secondary | ICD-10-CM | POA: Diagnosis not present

## 2018-10-10 DIAGNOSIS — Z8673 Personal history of transient ischemic attack (TIA), and cerebral infarction without residual deficits: Secondary | ICD-10-CM | POA: Insufficient documentation

## 2018-10-10 DIAGNOSIS — I130 Hypertensive heart and chronic kidney disease with heart failure and stage 1 through stage 4 chronic kidney disease, or unspecified chronic kidney disease: Secondary | ICD-10-CM | POA: Insufficient documentation

## 2018-10-10 DIAGNOSIS — N183 Chronic kidney disease, stage 3 (moderate): Secondary | ICD-10-CM | POA: Diagnosis not present

## 2018-10-10 DIAGNOSIS — R0602 Shortness of breath: Secondary | ICD-10-CM | POA: Diagnosis not present

## 2018-10-10 DIAGNOSIS — Z79899 Other long term (current) drug therapy: Secondary | ICD-10-CM | POA: Diagnosis not present

## 2018-10-10 DIAGNOSIS — Z87891 Personal history of nicotine dependence: Secondary | ICD-10-CM | POA: Insufficient documentation

## 2018-10-10 DIAGNOSIS — Z950 Presence of cardiac pacemaker: Secondary | ICD-10-CM | POA: Diagnosis not present

## 2018-10-10 DIAGNOSIS — M549 Dorsalgia, unspecified: Secondary | ICD-10-CM | POA: Insufficient documentation

## 2018-10-10 DIAGNOSIS — E1122 Type 2 diabetes mellitus with diabetic chronic kidney disease: Secondary | ICD-10-CM | POA: Diagnosis not present

## 2018-10-10 DIAGNOSIS — R079 Chest pain, unspecified: Secondary | ICD-10-CM | POA: Diagnosis not present

## 2018-10-10 DIAGNOSIS — Z7982 Long term (current) use of aspirin: Secondary | ICD-10-CM | POA: Insufficient documentation

## 2018-10-10 DIAGNOSIS — I2510 Atherosclerotic heart disease of native coronary artery without angina pectoris: Secondary | ICD-10-CM | POA: Insufficient documentation

## 2018-10-10 LAB — CBC
HCT: 38.6 % (ref 36.0–46.0)
Hemoglobin: 11.7 g/dL — ABNORMAL LOW (ref 12.0–15.0)
MCH: 28.5 pg (ref 26.0–34.0)
MCHC: 30.3 g/dL (ref 30.0–36.0)
MCV: 93.9 fL (ref 80.0–100.0)
Platelets: 137 10*3/uL — ABNORMAL LOW (ref 150–400)
RBC: 4.11 MIL/uL (ref 3.87–5.11)
RDW: 14.5 % (ref 11.5–15.5)
WBC: 5.5 10*3/uL (ref 4.0–10.5)
nRBC: 0 % (ref 0.0–0.2)

## 2018-10-10 LAB — BASIC METABOLIC PANEL
Anion gap: 10 (ref 5–15)
BUN: 33 mg/dL — ABNORMAL HIGH (ref 8–23)
CO2: 31 mmol/L (ref 22–32)
Calcium: 9 mg/dL (ref 8.9–10.3)
Chloride: 97 mmol/L — ABNORMAL LOW (ref 98–111)
Creatinine, Ser: 1.44 mg/dL — ABNORMAL HIGH (ref 0.44–1.00)
GFR calc Af Amer: 41 mL/min — ABNORMAL LOW (ref >60)
GFR calc non Af Amer: 36 mL/min — ABNORMAL LOW (ref >60)
Glucose, Bld: 123 mg/dL — ABNORMAL HIGH (ref 70–99)
Potassium: 4.7 mmol/L (ref 3.5–5.1)
Sodium: 138 mmol/L (ref 135–145)

## 2018-10-10 LAB — DIFFERENTIAL
Abs Immature Granulocytes: 0.02 10*3/uL (ref 0.00–0.07)
Basophils Absolute: 0 10*3/uL (ref 0.0–0.1)
Basophils Relative: 1 %
Eosinophils Absolute: 0.1 10*3/uL (ref 0.0–0.5)
Eosinophils Relative: 2 %
Immature Granulocytes: 0 %
Lymphocytes Relative: 22 %
Lymphs Abs: 1.2 10*3/uL (ref 0.7–4.0)
Monocytes Absolute: 0.7 10*3/uL (ref 0.1–1.0)
Monocytes Relative: 12 %
Neutro Abs: 3.5 10*3/uL (ref 1.7–7.7)
Neutrophils Relative %: 63 %

## 2018-10-10 LAB — TROPONIN I (HIGH SENSITIVITY)
Troponin I (High Sensitivity): 15 ng/L
Troponin I (High Sensitivity): 14 ng/L

## 2018-10-10 MED ORDER — SODIUM CHLORIDE 0.9% FLUSH: 3.0000 mL | Freq: Once | INTRAVENOUS | Status: DC

## 2018-10-10 MED ORDER — ONDANSETRON HCL 4 MG/2ML IJ SOLN
4.0000 mg | Freq: Once | INTRAMUSCULAR | Status: DC
  Filled 2018-10-10: qty 2

## 2018-10-10 MED ORDER — FENTANYL CITRATE (PF) 100 MCG/2ML IJ SOLN
50.0000 ug | Freq: Once | INTRAMUSCULAR | Status: DC
  Filled 2018-10-10: qty 2

## 2018-10-10 NOTE — Discharge Instructions (Addendum)
Follow up with cardiologist and discuss imdur  Return for concerning symptoms  Wear compression socks for leg swelling

## 2018-10-10 NOTE — ED Provider Notes (Signed)
7:16 PM Patient seen in conjunction with [place] PA-C.  Patient with history of coronary artery disease and CHF, chronic O2 at home on 3 L --presents with chest pain with radiation to her left shoulder and back.  Patient gets chest pains quite frequently.  She takes nitroglycerin at home for this at times which works "sometimes".  Today her symptoms were a bit worse than usual.  She took 2 nitroglycerin at home without much improvement.  She is on her usual 3 L without any recent adjustments.  She has her baseline lower extremity swelling.  She has multiple ongoing abdominal pains since a cholecystectomy which are at baseline as well.  Awaiting remainder of work-up.  Per patient history, it sounds like a acute flare of her chronic symptoms.  She has stable angina.  First troponin is normal.  Awaiting additional results.  EKG reviewed.  BP 116/71   Pulse 65   Temp 98.5 F (36.9 C) (Oral)   Resp 20   Ht 5\' 2"  (1.575 m)   Wt 113.4 kg   SpO2 99%   BMI 45.73 kg/m    Work-up reassuring. Pt seen by Dr. Gilford Raid. D/c home.      Carlisle Cater, PA-C 10/10/18 2139    Isla Pence, MD 10/10/18 2159

## 2018-10-10 NOTE — ED Notes (Signed)
Pt transported to XRay 

## 2018-10-10 NOTE — ED Triage Notes (Signed)
Pt reports left sided chest pain "tightness" that radiates to there back, started today while walking. Pt took two nitroglycerin about 1 hour ago, pain now 6/10. Pt uses chronic O2 at home.

## 2018-10-10 NOTE — ED Provider Notes (Signed)
[facility] EMERGENCY DEPARTMENT Provider Note   CSN: [id number] Arrival date & time: 10/10/18  1706     History   Chief Complaint Chief Complaint  Patient presents with  . Chest Pain    HPI [name] is a 73 y.o. female. PMHx includes CAD, CHF w/ pacemaker, DM, CKD III and obesity. Presents for CP that began today at noon.  Patient states that CP is sharp, intermittent, and worse with exertion.  Patient states she is also short of breath and states her pain radiates to her back and left arm.  Patient has taken 2 doses of nitro first at 12, second at 1230.  Patient states pain was mildly improved but has come back.  Patient denies nausea, diaphoresis.   Patient states this chest pain is similar to pain she has frequently and takes nitroglycerin for per cardiology.    Patient states she has had sinus issues, headache, cough and congestion for the past few days but denies fever chills.      HPI  Past Medical History:  Diagnosis Date  . AICD (automatic cardioverter/defibrillator) present   . Anemia   . Arthritis   . Asthma   . CAD (coronary artery disease)    a. 10/09/16 LHC: SVG->LAD patent, SVG->Diag patent, SVG->RCA patent, SVG->LCx occluded. EF 60%, b. 10/31/16 LHC DES to AV groove Circ, DES to intermed branch  . Cellulitis and abscess of foot 12/2014   RT FOOT  . CHF (congestive heart failure) ([place])   . Chronic bronchitis ([place])   . Chronic lower back pain   . Diverticulosis   . Facial numbness 02/2016  . Fatty liver disease, nonalcoholic   . Gastritis   . GERD (gastroesophageal reflux disease)   . Gout   . H/O hiatal hernia   . High cholesterol   . Hyperplastic colon polyp   . Hypertension   . IBS (irritable bowel syndrome)   . Internal hemorrhoids   . Ischemic cardiomyopathy   . Obesity   . On home oxygen therapy    "2L at night" (10/31/2016)  . OSA (obstructive sleep apnea)    "can't tolerate a mask" (10/30/2016)  . Pneumonia    "couple times" (10/31/2016)  . Shortness of breath   . TIA (transient ischemic attack)    "recently" (10/31/2016)  . Type II diabetes mellitus [facility])     Patient Active Problem List   Diagnosis Date Noted  . Leukopenia 05/03/2018  . Thrombocytopenia ([place]) 05/03/2018  . Pneumonia due to human metapneumovirus 05/02/2018  . Chest pain 05/01/2018  . Allergic rhinitis 04/13/2018  . S/P laparoscopic cholecystectomy 04/09/2018  . Chronic respiratory failure with hypoxia and hypercapnia ([place]) 03/07/2018  . Polyp of cecum   . Polyp of transverse colon   . Positive colorectal cancer screening using Cologuard test   . Dysphagia   . Acute on chronic renal failure ([place]) 11/20/2017  . CAD (coronary artery disease) 11/20/2017  . DM2 (diabetes mellitus, type 2) ([place]) 11/20/2017  . Epigastric abdominal pain 11/20/2017  . Vasomotor rhinitis 10/16/2016  . Right facial numbness 03/05/2016  . CKD (chronic kidney disease), stage III ([place]) 03/05/2016  . Atrial fibrillation ([place]) 10/24/2015  . Chest pain with moderate risk of acute coronary syndrome 10/23/2015  . History of TIA (transient ischemic attack) 10/22/2015  . Complete heart block ([place]) 06/22/2015  . Allergic drug rash due to anti-infective agent 04/29/2015  . Fatigue 02/14/2015  . Chronic diastolic CHF (congestive heart failure) ([place]) 02/14/2015  .  Cellulitis 12/21/2014  . S/P CABG x 5 11/29/2014  . CAD S/P percutaneous coronary angioplasty   . Diarrhea 07/12/2014  . Generalized abdominal pain 07/12/2014  . Diastolic CHF, acute on chronic (HCC) 11/04/2013  . Mixed hypercholesterolemia and hypertriglyceridemia 04/22/2013  . Vertigo 04/22/2013  . Dyspnea on exertion 08/25/2012  . Allergic to IV contrast 07/23/2012  . Unstable angina ([place]) 07/22/2012  . Morbid (severe) obesity due to excess calories ([place]) 11/22/2011  . Nonischemic cardiomyopathy ([place]) 11/22/2011  . GERD (gastroesophageal reflux disease) 11/22/2011  . Back pain 11/22/2011   . OSA (obstructive sleep apnea) 11/22/2011  . HEMORRHOIDS-EXTERNAL 02/21/2010  . NAUSEA 02/21/2010  . ABDOMINAL PAIN -GENERALIZED 02/21/2010  . PERSONAL HX COLONIC POLYPS 02/21/2010  . ANEMIA 04/16/2007  . Essential hypertension 04/16/2007  . DIVERTICULOSIS, COLON 04/16/2007  . ARTHRITIS 04/16/2007  . INTERNAL HEMORRHOIDS WITHOUT MENTION COMP 04/12/2007  . Asthma 04/12/2007  . Cardiac resynchronization therapy pacemaker (CRT-P) in place 04/12/2007  . Gastritis without bleeding 12/14/2006  . COLONIC POLYPS, HYPERPLASTIC 09/27/2002  . FATTY LIVER DISEASE 02/16/2002    Past Surgical History:  Procedure Laterality Date  . ABDOMINAL ULTRASOUND  12/01/2011   Peripelvic cysts- #1- 2.4x1.9x2.3cm, #2-1.2x0.9x1.2cm  . ANKLE FRACTURE SURGERY Right    "had rod put in"  . ANTERIOR CERVICAL DECOMP/DISCECTOMY FUSION    . APPENDECTOMY    . BACK SURGERY    . BIOPSY  12/29/2017   Procedure: BIOPSY;  Surgeon: [name], MD;  Location: [facility];  Service: Endoscopy;;  . BIV ICD INSERTION CRT-D  2001?  [name] BIV PACEMAKER GENERATOR CHANGE OUT N/A 11/02/2012   Procedure: BIV PACEMAKER GENERATOR CHANGE OUT;  Surgeon: Sanda Klein, MD;  Location: [facility] CATH LAB;  Service: Cardiovascular;  Laterality: N/A;  . CARDIAC CATHETERIZATION  05/17/1999   No significant coronary obstructive disease w/ mild 20% luminal irregularity of the first diag branch of the LAD  . CARDIAC CATHETERIZATION  07/08/2002   No significant CAD, moderately depressed LV systolic function  . CARDIAC CATHETERIZATION Bilateral 04/26/2007   Normal findings, recommend medical therapy  . CARDIAC CATHETERIZATION  02/18/2008   Moderate CAD, would benefit from having a functional study, recommend continue medical therapy  . CARDIAC CATHETERIZATION  07/23/2012   Medical therapy  . CARDIAC CATHETERIZATION N/A 11/24/2014   Procedure: Left Heart Cath and Coronary Angiography;  Surgeon: Troy Sine, MD;  Location: [facility] CV LAB;   Service: Cardiovascular;  Laterality: N/A;  . CARDIAC CATHETERIZATION  11/27/2014   Procedure: Intravascular Pressure Wire/FFR Study;  Surgeon: Peter M Martinique, MD;  Location: [facility] CV LAB;  Service: Cardiovascular;;  . CARDIAC CATHETERIZATION  10/09/2016  . CHOLECYSTECTOMY N/A 04/09/2018   Procedure: LAPAROSCOPIC CHOLECYSTECTOMY;  Surgeon: Greer Pickerel, MD;  Location: [facility] ORS;  Service: General;  Laterality: N/A;  . COLONOSCOPY WITH PROPOFOL N/A 12/29/2017   Procedure: COLONOSCOPY WITH PROPOFOL;  Surgeon: [name], MD;  Location: [facility];  Service: Endoscopy;  Laterality: N/A;  . CORONARY ANGIOGRAM  2010  . CORONARY ARTERY BYPASS GRAFT N/A 11/29/2014   Procedure: CORONARY ARTERY BYPASS GRAFTING x 5 (LIMA-LAD, SVG-D, SVG-OM1-OM2, SVG-PD);  Surgeon: Melrose Nakayama, MD;  Location: [facility];  Service: Open Heart Surgery;  Laterality: N/A;  . CORONARY STENT INTERVENTION N/A 10/31/2016   Procedure: CORONARY STENT INTERVENTION;  Surgeon: Burnell Blanks, MD;  Location: [facility] CV LAB;  Service: Cardiovascular;  Laterality: N/A;  . ESOPHAGOGASTRODUODENOSCOPY (EGD) WITH PROPOFOL N/A 12/29/2017   Procedure: ESOPHAGOGASTRODUODENOSCOPY (EGD) WITH PROPOFOL;  Surgeon: [name], MD;  Location: [facility];  Service: Endoscopy;  Laterality: N/A;  . FRACTURE SURGERY    . INSERT / REPLACE / [place]  . KNEE ARTHROSCOPY Bilateral   . LEFT HEART CATH AND CORS/GRAFTS ANGIOGRAPHY N/A 12/09/2017   Procedure: LEFT HEART CATH AND CORS/GRAFTS ANGIOGRAPHY;  Surgeon: Troy Sine, MD;  Location: [facility] CV LAB;  Service: Cardiovascular;  Laterality: N/A;  . LEFT HEART CATHETERIZATION WITH CORONARY ANGIOGRAM N/A 07/23/2012   Procedure: LEFT HEART CATHETERIZATION WITH CORONARY ANGIOGRAM;  Surgeon: Leonie Man, MD;  [facility];  Service: Cardiovascular;  Laterality: N/A;  Carlton Adam MYOVIEW  11/14/2011   Mild-moderate defect seen in Mid  Inferolateral and Mid Anterolateral regions-consistant w/ infarct/scar. No significant ischemia demonstrated.  [name] POLYPECTOMY  12/29/2017   Procedure: POLYPECTOMY;  Surgeon: [name], MD;  Location: [facility];  Service: Endoscopy;;  . RIGHT/LEFT HEART CATH AND CORONARY ANGIOGRAPHY N/A 10/09/2016   Procedure: RIGHT/LEFT HEART CATH AND CORONARY ANGIOGRAPHY;  Surgeon: Jolaine Artist, MD;  Location: [facility] CV LAB;  Service: Cardiovascular;  Laterality: N/A;  . TEE WITHOUT CARDIOVERSION N/A 11/29/2014   Procedure: TRANSESOPHAGEAL ECHOCARDIOGRAM (TEE);  Surgeon: Melrose Nakayama, MD;  Location: [facility];  Service: Open Heart Surgery;  Laterality: N/A;  . TRANSTHORACIC ECHOCARDIOGRAM  07/23/2012   EF 55-60%, normal-mild  . TUBAL LIGATION       OB History   No obstetric history on file.      Home Medications    Prior to Admission medications   Medication Sig Start Date End Date Taking? Authorizing Provider  acetaminophen (TYLENOL) 500 MG tablet Take 500 mg by mouth daily.     [provider]  albuterol (PROVENTIL HFA;VENTOLIN HFA) 108 (90 Base) MCG/ACT inhaler Inhale 2 puffs into the lungs every 6 (six) hours as needed for wheezing or shortness of breath. 05/02/18   Norval Morton, MD  albuterol (PROVENTIL) (2.5 MG/3ML) 0.083% nebulizer solution Take 3 mLs (2.5 mg total) by nebulization every 6 (six) hours as needed for wheezing or shortness of breath. 05/02/18   Norval Morton, MD  aspirin EC 81 MG tablet Take 1 tablet (81 mg total) by mouth daily. 03/27/16   Erlene Quan, PA-C  bisoprolol (ZEBETA) 5 MG tablet TAKE 1 TABLET EVERY DAY 08/27/18   Kilroy, Doreene Burke, PA-C  budesonide-formoterol [facility]) 160-4.5 MCG/ACT inhaler Inhale 2 puffs into the lungs 2 (two) times daily.    [provider]  cetirizine (ZYRTEC) 10 MG tablet Take 10 mg by mouth daily.  03/24/17   [provider]  clopidogrel (PLAVIX) 75 MG tablet Take 1 tablet (75 mg total) by  mouth daily. 04/27/18   Croitoru, [name], MD  clotrimazole (LOTRIMIN) 1 % cream Apply 1 application topically 2 (two) times daily as needed (rash). 05/02/18   Norval Morton, MD  clotrimazole (MYCELEX) 10 MG troche Take 10 mg by mouth daily as needed (Sore Mouth).     [provider]  colchicine 0.6 MG tablet Take 0.6 mg by mouth daily.     [provider]  diclofenac sodium (VOLTAREN) 1 % GEL Apply 2 g topically 4 (four) times daily. 05/02/18   Norval Morton, MD  diphenhydrAMINE (BENADRYL) 25 MG tablet Take 25 mg by mouth every 6 (six) hours as needed for allergies.    [provider]  docusate sodium (COLACE) 100 MG capsule Take 200 mg by mouth at bedtime.  [provider]  EPINEPHrine (EPIPEN 2-PAK) 0.3 mg/0.3 mL IJ SOAJ injection Inject 0.3 mg into the muscle once as needed (severe allergic reaction).    [provider]  febuxostat (ULORIC) 40 MG tablet Take 40 mg by mouth daily. 11/10/17   [provider]  Glucosamine HCl (GLUCOSAMINE PO) Take by mouth daily.    [provider]  guaiFENesin (MUCINEX) 600 MG 12 hr tablet Take 600 mg by mouth at bedtime as needed for cough or to loosen phlegm.     [provider]  hydrocortisone 2.5 % cream Apply 2.5 application topically daily as needed (itching).  01/08/18   [provider]  hydroxypropyl methylcellulose / hypromellose (ISOPTO TEARS / GONIOVISC) 2.5 % ophthalmic solution Place 1 drop into both eyes 3 (three) times daily.     [provider]  ipratropium (ATROVENT) 0.02 % nebulizer solution Take 2.5 mLs (0.5 mg total) by nebulization 4 (four) times daily. 05/02/18   Norval Morton, MD  isosorbide mononitrate (IMDUR) 60 MG 24 hr tablet TAKE 1 TABLET EVERY DAY 06/28/18   Croitoru, [name], MD  metolazone (ZAROXOLYN) 5 MG tablet Take 1 tablet (5 mg total) by mouth daily as needed (for weight gain of 3 LBS. or more). 09/28/18   Lendon Colonel, NP  Multiple  Vitamins-Minerals (ALIVE WOMENS ENERGY PO) Take 1 tablet by mouth daily.    [provider]  nitroGLYCERIN (NITROSTAT) 0.4 MG SL tablet Place 1 tablet (0.4 mg total) under the tongue every 5 (five) minutes as needed for chest pain. 11/13/16 12/25/18  Erlene Quan, PA-C  ondansetron (ZOFRAN ODT) 4 MG disintegrating tablet Take 1 tablet (4 mg total) by mouth every 8 (eight) hours as needed for nausea or vomiting. 09/17/18   [name], MD  OXYGEN Place 2-3 L into the nose See admin instructions. 2 lpm with sleep and 3 with exertion- [facility]    [provider]  pantoprazole (PROTONIX) 40 MG tablet Take 1 tablet (40 mg total) by mouth 2 (two) times daily. 09/06/18   Croitoru, [name], MD  pantoprazole (PROTONIX) 40 MG tablet Take 1 tablet (40 mg total) by mouth daily. 10/07/18   Zehr, Janett Billow D, PA-C  potassium chloride SA (K-DUR,KLOR-CON) 20 MEQ tablet Take 1 tablet (20 mEq total) by mouth daily. 04/27/18   Croitoru, [name], MD  Probiotic Product (PROBIOTIC PO) Take by mouth daily.    [provider]  RESTASIS 0.05 % ophthalmic emulsion Place 1 drop into both eyes 2 (two) times daily.  11/30/17   [provider]  rosuvastatin (CRESTOR) 20 MG tablet TAKE 1 TABLET AT BEDTIME 07/07/18   Croitoru, [name], MD  saxagliptin HCl (ONGLYZA) 2.5 MG TABS tablet Take 2.5 mg by mouth daily.  12/08/17   Almyra Deforest, PA  spironolactone (ALDACTONE) 25 MG tablet Take 1 tablet (25 mg total) by mouth at bedtime. 05/02/18   Norval Morton, MD  torsemide (DEMADEX) 20 MG tablet Take 1-2 tablets (20-40 mg total) by mouth See admin instructions. Take 40 mg by mouth in the morning and 20 mg in the evening 05/02/18   Fuller Plan A, MD  triamcinolone cream (KENALOG) 0.1 % Apply 1 application topically 2 (two) times daily as needed (itching).  08/24/14   [provider]  Vitamin D, Ergocalciferol, (DRISDOL) 1.25 MG (50000 UT) CAPS capsule Take 1 capsule (50,000 Units total) by mouth every 7  (seven) days. 09/30/18   Lendon Colonel, NP    Family History Family  History  Problem Relation Age of Onset  . Breast cancer Mother   . Diabetes Mother   . Heart disease Maternal Grandmother   . Kidney disease Maternal Grandmother   . Diabetes Maternal Grandmother   . Glaucoma Maternal Aunt   . Heart disease Maternal Aunt   . Asthma Sister   . Colon cancer Neg Hx   . Stomach cancer Neg Hx   . Pancreatic cancer Neg Hx     Social History Social History   Tobacco Use  . Smoking status: Former Smoker    Packs/day: 0.25    Years: 3.00    Pack years: 0.75    Types: Cigarettes    Quit date: 02/11/1968    Years since quitting: 50.6  . Smokeless tobacco: Never Used  Substance Use Topics  . Alcohol use: No  . Drug use: No     Allergies   Ivp dye [iodinated diagnostic agents], Shellfish allergy, Shellfish-derived products, Sulfa antibiotics, Iodine, Atorvastatin, Doxycycline, Cephalexin, and Zithromax [azithromycin]   Review of Systems Review of Systems  Constitutional: Negative for chills and fever.  HENT: Positive for congestion and sinus pressure. Negative for sore throat.   Eyes: Negative for pain.  Respiratory: Positive for shortness of breath.   Cardiovascular: Positive for chest pain.  Gastrointestinal: Negative for nausea and vomiting.  Genitourinary: Negative for dysuria and hematuria.  Musculoskeletal: Negative for neck pain.  Skin: Negative for rash.  Neurological: Positive for headaches. Negative for dizziness.     Physical Exam Updated Vital Signs BP (!) 147/57 (BP Location: Right Arm)   Pulse 70   Temp 98.5 F (36.9 C) (Oral)   Resp (!) 22   Ht 5\' 2"  (1.575 m)   Wt 113.4 kg   SpO2 100%   BMI 45.73 kg/m   Physical Exam Constitutional:      General: She is not in acute distress.    Appearance: She is obese.  HENT:     Head: Normocephalic and atraumatic.     Right Ear: External ear normal.     Left Ear: External ear normal.     Nose: Nose  normal.  Neck:     Musculoskeletal: Normal range of motion.  Cardiovascular:     Rate and Rhythm: Normal rate and regular rhythm.     Heart sounds: No murmur. No friction rub. No gallop.   Pulmonary:     Effort: Pulmonary effort is normal. No respiratory distress.     Breath sounds: No wheezing or rhonchi.  Abdominal:     General: There is distension.     Palpations: Abdomen is soft.     Tenderness: There is no abdominal tenderness. There is no guarding or rebound.  Musculoskeletal:     Right lower leg: Edema (2+) present.     Left lower leg: Edema (2+) present.  Skin:    General: Skin is warm and dry.  Neurological:     Mental Status: She is alert. Mental status is at baseline.  Psychiatric:        Mood and Affect: Mood normal.        Behavior: Behavior normal.      ED Treatments / Results  Labs (all labs ordered are listed, but only abnormal results are displayed) Labs Reviewed  BASIC METABOLIC PANEL  CBC  TROPONIN I (HIGH SENSITIVITY)    EKG None  Radiology No results found.  Procedures Procedures (including critical care time)  Medications Ordered in ED Medications  sodium chloride flush (NS)  0.9 % injection 3 mL (has no administration in time range)     Initial Impression / Assessment and Plan / ED Course  I have reviewed the triage vital signs and the nursing notes.  Pertinent labs & imaging results that were available during my care of the patient were reviewed by me and considered in my medical decision making (see chart for details).    Patient summary: Patient is a 73 year old female with history of cardiovascular disease including CAD, CHF presenting for chest pain.  Patient presentation concerning for ACS, AAA, tamponade, vasospasm, valvular heart disease, AS, PE, PTX, PNA, muscle strain, psychological/anxiety.  Patient appearance and physical exam are reassuring.  Along with her history of similar chest pains in the past with benign course, this  presentation is unlikely to be AAA, tamponade, or other emergent cardiac disease.  Patient has good follow-up with cardiology who she sees regularly and instructed her to use nitroglycerin for anginal pain.  Patient also has gastrointestinal complaints that are unchanged since her cholecystectomy.   Cardiologist note 09/28/2018 indicates that patient is medication compliant but not diet compliant and continues to eat high salt and cholesterol food.  Patient endorses recent fast food usage and states she has not been eating well.  Labs and imaging included chest x-ray, EKG and troponins are reassuring. Patient likely experiencing anginal chest pain that is normal for her.   Disposition: Patient patient discharged home with plan to follow-up with cardiologist Dr. Gilford Raid discussed the potential for increasing home BP medication since she is using nitroglycerin frequently.  Educated on need to restrict sodium in diet.   Patient given return precautions including any new, worsening, or concerning symptoms.       Final Clinical Impressions(s) / ED Diagnoses   Final diagnoses:  None    ED Discharge Orders    None       Tedd Sias, [place] 10/10/18 2103    Isla Pence, MD 10/10/18 2129

## 2018-10-11 ENCOUNTER — Telehealth: Payer: Self-pay | Admitting: Cardiovascular Disease

## 2018-10-11 NOTE — Telephone Encounter (Signed)
[id number] 

## 2018-10-11 NOTE — Telephone Encounter (Signed)
New Message   Patient states that she was in the hospital on yesterday and was told to call in and discuss her medication due to her blood pressure running low. Please give patient a call back to assist.

## 2018-10-11 NOTE — Telephone Encounter (Signed)
Looks like BP yesterday in Epic was 122/62  HR 73.

## 2018-10-11 NOTE — Telephone Encounter (Signed)
Looks like BP yesterday in Epic was 122/62  HR 73. She states that BP/HR today is 111/62 80, yesterday she states that this has been happening all week she is fatigued and having intermittent chest pain. She states that this CP is new since yesterday and that is why she went to the ER. Along with her BP/HR yesterday was 92/50 and 88/40. She thinks that this is why she is so fatigued. She states that she was having stomach issues the beginning of the week but that is resolved before the CP started. She stated that she took 2 nitro yesterday but they did not help with the CP, she took these before going to the ER. She is taking all medications as ordered Do you want to adjust the medications. She

## 2018-10-12 MED ORDER — ISOSORBIDE MONONITRATE ER 30 MG PO TB24: 30.0000 mg | ORAL_TABLET | Freq: Every day | ORAL | Status: DC

## 2018-10-12 MED ORDER — SPIRONOLACTONE 25 MG PO TABS: 12.5000 mg | ORAL_TABLET | Freq: Every day | ORAL | Status: DC

## 2018-10-12 NOTE — Telephone Encounter (Signed)
Let's try reducing the spironolactone to 12.5 mg daily (it will take up to 2 weeks to see the full impact on her BP) and reduce the isosorbide to 30 mg daily. Please let us know how she feels and her BP readings in 2 weeks.

## 2018-10-12 NOTE — Telephone Encounter (Signed)
The patient has been made aware and agreed to decrease the spironolactone to 12.5 daily and the Imdur to 30 mg daily. She will call back in two weeks with blood pressure readings or sooner if needed.

## 2018-10-14 ENCOUNTER — Other Ambulatory Visit: Payer: Self-pay | Admitting: Family Medicine

## 2018-10-14 ENCOUNTER — Telehealth: Payer: Self-pay | Admitting: Cardiovascular Disease

## 2018-10-14 DIAGNOSIS — R519 Headache, unspecified: Secondary | ICD-10-CM

## 2018-10-14 DIAGNOSIS — R600 Localized edema: Secondary | ICD-10-CM

## 2018-10-14 NOTE — Telephone Encounter (Signed)
What is her actual weight? Towards the end of August she was getting "dry" and weak, so we reduced her spironolactone and she was expected to gain several pounds. If she is over 250 lb, we may have to adjust her diuretic. Please schedule her for a lower extremity duplex US for asymmetrical edema/suspected DVT.

## 2018-10-14 NOTE — Telephone Encounter (Signed)
She is currently 255lbs.   Ordered testing. Thank you!

## 2018-10-14 NOTE — Telephone Encounter (Signed)
° ° °  Pt c/o swelling: STAT is pt has developed SOB within 24 hours  1) How much weight have you gained and in what time span?   2) If swelling, where is the swelling located? LEGS, FEET  3) Are you currently taking a fluid pill? YES  4) Are you currently SOB? NO  5) Do you have a log of your daily weights (if so, list)? 255 today  6) Have you gained 3 pounds in a day or 5 pounds in a week?  7) Have you traveled recently? NO

## 2018-10-14 NOTE — Telephone Encounter (Signed)
Called patient, she states that she has had some increase swelling in her legs, she was told in the hospital that she had cellulitis, but the past few days she has had something going on with her legs. She states they are swollen, red, and painful- bilaterally, but mostly in the left. Patient denies chest pain, and states she doesn't have difficulty breathing but feels that she is wheezy and that causes her to lose her breath (she is going to take a breathing treatment to see if it helps). Patient states she has gained about 10 lbs in the past 2-3 days, denies change in medication, or diet.  She is taking the Torsemide 2 tablets the first day- and then 3 the next day and continuing with this.   Please give any recommendations for patient, thank you!

## 2018-10-15 ENCOUNTER — Telehealth: Payer: Self-pay | Admitting: *Deleted

## 2018-10-15 ENCOUNTER — Other Ambulatory Visit: Payer: Self-pay

## 2018-10-15 ENCOUNTER — Encounter: Payer: Self-pay | Admitting: Gastroenterology

## 2018-10-15 ENCOUNTER — Encounter (HOSPITAL_COMMUNITY): Payer: Self-pay | Admitting: Cardiology

## 2018-10-15 ENCOUNTER — Ambulatory Visit (HOSPITAL_COMMUNITY)
Admission: RE | Admit: 2018-10-15 | Discharge: 2018-10-15 | Disposition: A | Discharge status: Home / Self Care | Payer: Medicare HMO | Source: Ambulatory Visit | Attending: Cardiology | Admitting: Cardiology

## 2018-10-15 DIAGNOSIS — R600 Localized edema: Secondary | ICD-10-CM | POA: Diagnosis not present

## 2018-10-15 MED ORDER — TORSEMIDE 20 MG PO TABS: 60.0000 mg | ORAL_TABLET | Freq: Every day | ORAL | 3 refills | Status: DC

## 2018-10-15 NOTE — Progress Notes (Signed)
Venous duplex negative for DVT. 

## 2018-10-15 NOTE — Telephone Encounter (Signed)
The patient has been made aware and verbalized her understanding to increase the Torsemide to 60 mg once daily. She has been advised to call the on call or her PCP if the skin becomes red, hot or she develops a fever.

## 2018-10-15 NOTE — Telephone Encounter (Signed)
Patient made aware of results and verbalized understanding.  The patient stated that her swelling is not any better today but she did state that the swelling does go down some at night and worsens throughout the day.   She did not check her weight today and stated that she has not been checking it daily. Weight yesterday was 255 pounds.   She currently takes torsemide 40 mg and 60 mg alternating days. She is also on 12.5 mg of spironolactone.   She denies chest pain and was not audibly short of breath on the phone. She stated that she has shortness of breath on exertion but this is not new.

## 2018-10-15 NOTE — Telephone Encounter (Signed)
-----   Message from Sanda Klein, MD sent at 10/15/2018  1:49 PM EDT ----- No clots were seen in either leg

## 2018-10-15 NOTE — Telephone Encounter (Signed)
The weight is only 3 lb higher than her last visit in our office (although 8 lb higher than our last telemedicine visit). Let's try torsemide 60 mg daily, keep the legs elevated, call PCP or [place] if the skin is red/hot or she has fever

## 2018-10-15 NOTE — Telephone Encounter (Signed)
Attempted to reach the patient. Was unable to leave a message 

## 2018-10-19 ENCOUNTER — Telehealth: Payer: Self-pay | Admitting: Cardiovascular Disease

## 2018-10-19 NOTE — Telephone Encounter (Signed)
No other cardiac or vascular cause for her sore legs. OK to reduce the diuretic dose as she requested.

## 2018-10-19 NOTE — Telephone Encounter (Signed)
Returned legs are swollen she states that her legs are killing her she states that since the 10-15-18 she has been taking torse 60mg  daily her weight today is 252 she is up "again" and would like to know why her legs are so painful and she cannot walk. She states that her weight went down now it is back up again. She is now walking with a walker. She states that she cannot walk with out it. Increase torse? Please advise

## 2018-10-19 NOTE — Telephone Encounter (Signed)
New Message     Pt is calling and says her legs are bothering her really bad and she fell a couple of days ago

## 2018-10-19 NOTE — Telephone Encounter (Signed)
Call placed to the patient. She stated that the swelling is much better and she would like to know if she can return to her previous dose of Torsemide 60 mg alternating with the 40 mg.   She is wearing compression stockings during the day.   Her weight today was 252 pounds which is down 3 pounds from 9/3.  She stated that she fell over the weekend but did not suffer any injuries. She got up and her legs gave out. She is mainly sedentary throughout the day due pain in her legs (which is worse at night). She stated that once the swelling goes down, her pain is relieved some. She would like to know if Dr. [name] has any suggestions concerning her symptoms.

## 2018-10-20 ENCOUNTER — Telehealth: Payer: Self-pay | Admitting: Gastroenterology

## 2018-10-20 MED ORDER — TORSEMIDE 20 MG PO TABS: ORAL_TABLET | ORAL | 3 refills | Status: DC

## 2018-10-20 NOTE — Telephone Encounter (Signed)
The patient has been made aware. She will go back to her original dosage of Torsemide and call back if the swelling comes back. She stated that pain and swelling are better today.

## 2018-10-20 NOTE — Telephone Encounter (Signed)
Stomach Swelling , abdominal pain  No Nausea or Vomting  When she starts eating it burns all the way down to her stomach   Wants to know what to do next   This was a triage not a medication question call     Explained to patient if she gets any worse to go to the ED

## 2018-10-21 ENCOUNTER — Other Ambulatory Visit: Payer: Medicare HMO

## 2018-10-21 DIAGNOSIS — K5900 Constipation, unspecified: Secondary | ICD-10-CM | POA: Insufficient documentation

## 2018-10-21 DIAGNOSIS — K581 Irritable bowel syndrome with constipation: Secondary | ICD-10-CM | POA: Insufficient documentation

## 2018-10-21 NOTE — Telephone Encounter (Signed)
Scheduled pt for a follow up with Dr Silverio Decamp on 9/23  Per patients request she did not want to see an APP

## 2018-10-21 NOTE — Telephone Encounter (Signed)
Please schedule office visit, next available appointment with either APP or myself

## 2018-10-27 ENCOUNTER — Other Ambulatory Visit: Payer: Self-pay

## 2018-10-27 ENCOUNTER — Ambulatory Visit
Admission: RE | Admit: 2018-10-27 | Discharge: 2018-10-27 | Disposition: A | Discharge status: Home / Self Care | Payer: Medicare HMO | Source: Ambulatory Visit | Attending: Family Medicine | Admitting: Family Medicine

## 2018-10-27 DIAGNOSIS — R51 Headache: Secondary | ICD-10-CM | POA: Diagnosis not present

## 2018-10-27 DIAGNOSIS — S0990XA Unspecified injury of head, initial encounter: Secondary | ICD-10-CM | POA: Diagnosis not present

## 2018-10-27 DIAGNOSIS — R519 Headache, unspecified: Secondary | ICD-10-CM

## 2018-10-28 ENCOUNTER — Other Ambulatory Visit: Payer: Self-pay

## 2018-10-28 ENCOUNTER — Ambulatory Visit: Payer: Medicare HMO | Attending: Family Medicine | Admitting: Physical Therapy

## 2018-10-28 ENCOUNTER — Encounter: Payer: Self-pay | Admitting: Physical Therapy

## 2018-10-28 DIAGNOSIS — R296 Repeated falls: Secondary | ICD-10-CM | POA: Diagnosis not present

## 2018-10-28 DIAGNOSIS — R262 Difficulty in walking, not elsewhere classified: Secondary | ICD-10-CM | POA: Insufficient documentation

## 2018-10-28 DIAGNOSIS — M6281 Muscle weakness (generalized): Secondary | ICD-10-CM

## 2018-10-28 NOTE — Therapy (Signed)
[facility] [address], [place], [place], [address] Phone: [phone]   Fax:  [phone]  Physical Therapy Evaluation  Patient Details  Name: [name] MRN: [id number] Date of Birth: 23-Jan-1946 Referring Provider (PT): Dr. Dorthy Cooler   Encounter Date: 10/28/2018  PT End of Session - 10/28/18 1947    Visit Number  1    Date for PT Re-Evaluation  12/23/18    PT Start Time  1346    PT Stop Time  1429    PT Time Calculation (min)  43 min    Activity Tolerance  Patient tolerated treatment well       Past Medical History:  Diagnosis Date  . AICD (automatic cardioverter/defibrillator) present   . Anemia   . Arthritis   . Asthma   . CAD (coronary artery disease)    a. 10/09/16 LHC: SVG->LAD patent, SVG->Diag patent, SVG->RCA patent, SVG->LCx occluded. EF 60%, b. 10/31/16 LHC DES to AV groove Circ, DES to intermed branch  . Cellulitis and abscess of foot 12/2014   RT FOOT  . CHF (congestive heart failure) ([place])   . Chronic bronchitis ([place])   . Chronic lower back pain   . Diverticulosis   . Facial numbness 02/2016  . Fatty liver disease, nonalcoholic   . Gastritis   . GERD (gastroesophageal reflux disease)   . Gout   . H/O hiatal hernia   . High cholesterol   . Hyperplastic colon polyp   . Hypertension   . IBS (irritable bowel syndrome)   . Internal hemorrhoids   . Ischemic cardiomyopathy   . Obesity   . On home oxygen therapy    "2L at night" (10/31/2016)  . OSA (obstructive sleep apnea)    "can't tolerate a mask" (10/30/2016)  . Pneumonia    "couple times" (10/31/2016)  . Shortness of breath   . TIA (transient ischemic attack)    "recently" (10/31/2016)  . Type II diabetes mellitus ([place])     Past Surgical History:  Procedure Laterality Date  . ABDOMINAL ULTRASOUND  12/01/2011   Peripelvic cysts- #1- 2.4x1.9x2.3cm, #2-1.2x0.9x1.2cm  . ANKLE FRACTURE SURGERY Right    "had rod put in"  . ANTERIOR  CERVICAL DECOMP/DISCECTOMY FUSION    . APPENDECTOMY    . BACK SURGERY    . BIOPSY  12/29/2017   Procedure: BIOPSY;  Surgeon: [name], MD;  [facility]: [facility];  Service: Endoscopy;;  . BIV ICD INSERTION CRT-D  2001?  [name] BIV PACEMAKER GENERATOR CHANGE OUT N/A 11/02/2012   Procedure: BIV PACEMAKER GENERATOR CHANGE OUT;  Surgeon: Sanda Klein, MD;  Location: [facility] CATH LAB;  Service: Cardiovascular;  Laterality: N/A;  . CARDIAC CATHETERIZATION  05/17/1999   No significant coronary obstructive disease w/ mild 20% luminal irregularity of the first diag branch of the LAD  . CARDIAC CATHETERIZATION  07/08/2002   No significant CAD, moderately depressed LV systolic function  . CARDIAC CATHETERIZATION Bilateral 04/26/2007   Normal findings, recommend medical therapy  . CARDIAC CATHETERIZATION  02/18/2008   Moderate CAD, would benefit from having a functional study, recommend continue medical therapy  . CARDIAC CATHETERIZATION  07/23/2012   Medical therapy  . CARDIAC CATHETERIZATION N/A 11/24/2014   Procedure: Left Heart Cath and Coronary Angiography;  Surgeon: Troy Sine, MD;  Location: [facility] CV LAB;  Service: Cardiovascular;  Laterality: N/A;  . CARDIAC CATHETERIZATION  11/27/2014   Procedure: Intravascular Pressure Wire/FFR Study;  Surgeon: Peter M Martinique, MD;  Location: [facility] CV LAB;  Service: Cardiovascular;;  . CARDIAC CATHETERIZATION  10/09/2016  . CHOLECYSTECTOMY N/A 04/09/2018   Procedure: LAPAROSCOPIC CHOLECYSTECTOMY;  Surgeon: Greer Pickerel, MD;  [facility];  Service: General;  Laterality: N/A;  . COLONOSCOPY WITH PROPOFOL N/A 12/29/2017   Procedure: COLONOSCOPY WITH PROPOFOL;  Surgeon: [name], MD;  [facility];  Service: Endoscopy;  Laterality: N/A;  . CORONARY ANGIOGRAM  2010  . CORONARY ARTERY BYPASS GRAFT N/A 11/29/2014   Procedure: CORONARY ARTERY BYPASS GRAFTING x 5 (LIMA-LAD, SVG-D, SVG-OM1-OM2, SVG-PD);  Surgeon: Melrose Nakayama, MD;  Location: [facility];  Service: Open Heart Surgery;  Laterality: N/A;  . CORONARY STENT INTERVENTION N/A 10/31/2016   Procedure: CORONARY STENT INTERVENTION;  Surgeon: Burnell Blanks, MD;  Location: [place] CV LAB;  Service: Cardiovascular;  Laterality: N/A;  . ESOPHAGOGASTRODUODENOSCOPY (EGD) WITH PROPOFOL N/A 12/29/2017   Procedure: ESOPHAGOGASTRODUODENOSCOPY (EGD) WITH PROPOFOL;  Surgeon: [name], MD;  [facility];  Service: Endoscopy;  Laterality: N/A;  . FRACTURE SURGERY    . INSERT / REPLACE / [place]  . KNEE ARTHROSCOPY Bilateral   . LEFT HEART CATH AND CORS/GRAFTS ANGIOGRAPHY N/A 12/09/2017   Procedure: LEFT HEART CATH AND CORS/GRAFTS ANGIOGRAPHY;  Surgeon: Troy Sine, MD;  Location: [facility] CV LAB;  Service: Cardiovascular;  Laterality: N/A;  . LEFT HEART CATHETERIZATION WITH CORONARY ANGIOGRAM N/A 07/23/2012   Procedure: LEFT HEART CATHETERIZATION WITH CORONARY ANGIOGRAM;  Surgeon: Leonie Man, MD;  Location: [facility] CATH LAB;  Service: Cardiovascular;  Laterality: N/A;  Carlton Adam MYOVIEW  11/14/2011   Mild-moderate defect seen in Mid Inferolateral and Mid Anterolateral regions-consistant w/ infarct/scar. No significant ischemia demonstrated.  [name] POLYPECTOMY  12/29/2017   Procedure: POLYPECTOMY;  Surgeon: [name], MD;  [facility];  Service: Endoscopy;;  . RIGHT/LEFT HEART CATH AND CORONARY ANGIOGRAPHY N/A 10/09/2016   Procedure: RIGHT/LEFT HEART CATH AND CORONARY ANGIOGRAPHY;  Surgeon: Jolaine Artist, MD;  Location: [facility] CV LAB;  Service: Cardiovascular;  Laterality: N/A;  . TEE WITHOUT CARDIOVERSION N/A 11/29/2014   Procedure: TRANSESOPHAGEAL ECHOCARDIOGRAM (TEE);  Surgeon: Melrose Nakayama, MD;  Location: [facility];  Service: Open Heart Surgery;  Laterality: N/A;  . TRANSTHORACIC ECHOCARDIOGRAM  07/23/2012   EF 55-60%, normal-mild  . TUBAL LIGATION      There were no vitals  filed for this visit.   Subjective Assessment - 10/28/18 1352    Subjective  Trouble walking;  cellulitis in legs after heart surgery which limits ability to walk.  1 1/2 weeks ago legs gave out.  Used walker 1 week.  Now not using walker now. Uses motorized scooter at the grocery store.  Lives with son and walks at home when he is there.  Now not going to church by myself.    Pertinent History  3L O2 dependent;  chronic LBP; diabetes;  CABG x5;  left ankle fracture; pacemaker    Limitations  House hold activities;Walking;Standing    How long can you stand comfortably?  < 20 minutes to cook supper    How long can you walk comfortably?  living room to bathroom    Diagnostic tests  no    Patient Stated Goals  be able to walk and not be afraid to fall;  go to church by myself    Currently in Pain?  Yes    Pain Score  8     Pain Location  Back  Pain Orientation  Right;Left    Pain Type  Chronic pain    Pain Radiating Towards  both legs    Pain Frequency  Constant    Aggravating Factors   walking, standing    Pain Relieving Factors  sitting         [facility] PT Assessment - 10/28/18 0001      Assessment   Medical Diagnosis  physical deconditioning;  bil LE weakness     Referring Provider (PT)  Dr. Dorthy Cooler    Onset Date/Surgical Date  --   2 months    Next MD Visit  unsure     Prior Therapy  back and legs       Precautions   Precautions  Fall      Restrictions   Weight Bearing Restrictions  No      Balance Screen   Has the patient fallen in the past 6 months  Yes    How many times?  3    Has the patient had a decrease in activity level because of a fear of falling?   Yes    Is the patient reluctant to leave their home because of a fear of falling?   Yes      [place]  Private residence    Living Arrangements  Children    Type of [place] Access  Level entry    Home Layout  One level      Prior Function   Level of Independence   Needs assistance with ADLs;Needs assistance with homemaking    Vocation  Retired    Leisure  shopping, be with grandchildren       Observation/Other Assessments   Activities of Balance Confidence Scale (ABC Scale)   49.73      Posture/Postural Control   Posture Comments  moderate bil lower leg edema       AROM   Overall AROM Comments  bil UE elevation to 140 degrees;  LEs WFLs      Strength   Overall Strength Comments  trunk flexion/extension 4-/5     Right Hip Flexion  4/5    Right Hip Extension  4-/5    Right Hip ABduction  4-/5    Left Hip Flexion  4/5    Left Hip Extension  4-/5    Left Hip ABduction  4-/5    Right Knee Flexion  4/5    Right Knee Extension  4/5    Left Knee Flexion  4/5    Left Knee Extension  4/5    Right/Left Ankle  Right;Left    Right Ankle Dorsiflexion  4/5    Right Ankle Plantar Flexion  4/5    Left Ankle Dorsiflexion  4/5    Left Ankle Plantar Flexion  4/5      Ambulation/Gait   Ambulation Distance (Feet)  75 Feet    Gait Pattern  Wide base of support    Gait Comments  carrying purse and O2        Standardized Balance Assessment   Five times sit to stand comments   20 sec    no UE assist      Berg Balance Test   Sit to Stand  Able to stand without using hands and stabilize independently    Standing Unsupported  Able to stand safely 2 minutes    Sitting with Back Unsupported but Feet Supported on Floor or Stool  Able  to sit safely and securely 2 minutes    Stand to Sit  Sits safely with minimal use of hands    Transfers  Able to transfer safely, minor use of hands    Standing Unsupported with Eyes Closed  Able to stand 10 seconds safely    Standing Unsupported with Feet Together  Able to place feet together independently and stand for 1 minute with supervision    From Standing, Reach Forward with Outstretched Arm  Can reach forward >5 cm safely (2")    From Standing Position, Pick up Object from [place] to pick up shoe safely and easily     From Standing Position, Turn to Look Behind Over each Shoulder  Looks behind one side only/other side shows less weight shift    Turn 360 Degrees  Able to turn 360 degrees safely but slowly    Standing Unsupported, Alternately Place Feet on Step/Stool  Needs assistance to keep from falling or unable to try    Standing Unsupported, One Foot in Front  Able to take small step independently and hold 30 seconds    Standing on One Leg  Unable to try or needs assist to prevent fall    Total Score  40      Timed Up and Go Test   Normal TUG (seconds)  14                Objective measurements completed on examination: See above findings.                PT Short Term Goals - 10/28/18 2008      PT SHORT TERM GOAL #1   Title  The patient will demonstrate compliance with initial HEP for strengthening of core and LE    Time  4    Period  Weeks    Status  New    Target Date  11/25/18      PT SHORT TERM GOAL #2   Title  The patient will be able to ambulate 160 feet with RW with supervision    Time  4    Period  Weeks    Status  New      PT SHORT TERM GOAL #3   Title  Improved BERG balance test to 43/56 indicating improved safety with home and communtiy  mobility    Time  4    Period  Weeks      PT SHORT TERM GOAL #4   Title  Activities Based Confidence Scale score improved to 54%    Time  4    Period  Weeks    Status  New        PT Long Term Goals - 10/28/18 2014      PT LONG TERM GOAL #1   Title  The patient will be independent with safe self progression of HEP    Time  8    Period  Weeks    Status  New    Target Date  12/23/18      PT LONG TERM GOAL #2   Title  The patient will be able to ambulate 250 feet needed to attend church    Time  8    Period  Weeks    Status  New      PT LONG TERM GOAL #3   Title  Improved LE strength to grossly 4/5 to 4+/5 as indicated by increased 5x sit to stand speed to 15 sec  Time  8    Period  Weeks    Status   New      PT LONG TERM GOAL #4   Title  BERG balance score improved to 46/56 indicating improved gait safety and decreased risk of falls    Time  8    Period  Weeks    Status  New      PT LONG TERM GOAL #5   Title  Activities Based Confidence Scale improved to 59%    Time  8    Period  Weeks    Status  New             Plan - 10/28/18 1948    Clinical Impression Statement  The patient is a pleasant 73 year old who has an extensive medical history.  She is on 3L oxygen  on a fulltime basis.  She has a long term history of LBP but she is referred to PT for for physical deconditioning and bil LE weakness.   She reports a progressive weakening over time starting about 6 months ago but especially in the last 2 months.  She has fallen 3x with the most recent fall 1 1/2 weeks ago when her legs gave out.  She used a RW for about a week following that but is not using one today.  She reports her maximum distance walking is within her apartment only.  She uses the motorized cart at the grocery store and has stopped going to church recently secondary to a lack of mobility.  Her Activities Based Confidence Scale is moderately impaired at 49%.  LE strength grossly4-/5 to  4/5.  Timed up and Go test is decreased for her age:  33 sec.BERG balance score is 40/56 indicating a near 100% risk of falls.  We discussed the results of the balance test and the recommendation for the full time use of a walker at this time.  She would benefit from skilled PT to address these numerous impairments.    Personal Factors and Comorbidities  Age;Past/Current Experience;Comorbidity 1;Comorbidity 2;Comorbidity 3+;Time since onset of injury/illness/exacerbation;Transportation    Comorbidities  cardiac history with bypass surgery 2 years ago, diabetes, HTN, cellulitis, kidney failure, O2 dependent; obesity, asthma; pacemaker    Examination-Activity Limitations  Reach Overhead;Locomotion Level;Stand;Stairs;Lift;Carry     Examination-Participation Restrictions  Church;Meal Prep;Cleaning;Community Activity;Shop    Stability/Clinical Decision Making  Evolving/Moderate complexity    Clinical Decision Making  Moderate    Rehab Potential  Good    PT Frequency  2x / week    PT Duration  8 weeks    PT Treatment/Interventions  ADLs/Self Care Home Management;Gait training;DME Instruction;Functional mobility training;Stair training;Therapeutic activities;Therapeutic exercise;Neuromuscular re-education;Manual techniques;Patient/family education    PT Next Visit Plan  initiate HEP mostly sitting LE strengthening to start;  low level standing balance in short bouts secondary to pain with prolonged standing;  Nu-Step;  periodic BP and O2 saturation checks    Recommended Other Services  patient using arranged transportation through [employer] and Agree with Plan of Care  Patient       Patient will benefit from skilled therapeutic intervention in order to improve the following deficits and impairments:  Obesity, Pain, Decreased strength, Decreased activity tolerance, Difficulty walking, Impaired perceived functional ability  Visit Diagnosis: Muscle weakness (generalized) - Plan: PT plan of care cert/re-cert  Difficulty in walking, not elsewhere classified - Plan: PT plan of care cert/re-cert  Repeated falls - Plan: PT plan of care cert/re-cert  Problem List Patient Active Problem List   Diagnosis Date Noted  . Constipation 10/21/2018  . Leukopenia 05/03/2018  . Thrombocytopenia ([place]) 05/03/2018  . Pneumonia due to human metapneumovirus 05/02/2018  . Chest pain 05/01/2018  . Allergic rhinitis 04/13/2018  . S/P laparoscopic cholecystectomy 04/09/2018  . Chronic respiratory failure with hypoxia and hypercapnia ([place]) 03/07/2018  . Polyp of cecum   . Polyp of transverse colon   . Positive colorectal cancer screening using Cologuard test   . Dysphagia   . Acute on chronic renal failure ([place]) 11/20/2017  .  CAD (coronary artery disease) 11/20/2017  . DM2 (diabetes mellitus, type 2) ([place]) 11/20/2017  . Abdominal pain, epigastric 11/20/2017  . Vasomotor rhinitis 10/16/2016  . Right facial numbness 03/05/2016  . CKD (chronic kidney disease), stage III ([place]) 03/05/2016  . Atrial fibrillation ([place]) 10/24/2015  . Chest pain with moderate risk of acute coronary syndrome 10/23/2015  . History of TIA (transient ischemic attack) 10/22/2015  . Complete heart block ([place]) 06/22/2015  . Allergic drug rash due to anti-infective agent 04/29/2015  . Fatigue 02/14/2015  . Chronic diastolic CHF (congestive heart failure) ([place]) 02/14/2015  . Cellulitis 12/21/2014  . S/P CABG x 5 11/29/2014  . CAD S/P percutaneous coronary angioplasty   . Diarrhea 07/12/2014  . Generalized abdominal pain 07/12/2014  . Diastolic CHF, acute on chronic (HCC) 11/04/2013  . Mixed hypercholesterolemia and hypertriglyceridemia 04/22/2013  . Vertigo 04/22/2013  . Dyspnea on exertion 08/25/2012  . Allergic to IV contrast 07/23/2012  . Unstable angina ([place]) 07/22/2012  . Morbid (severe) obesity due to excess calories ([place]) 11/22/2011  . Nonischemic cardiomyopathy ([place]) 11/22/2011  . Gastroesophageal reflux disease 11/22/2011  . Back pain 11/22/2011  . OSA (obstructive sleep apnea) 11/22/2011  . HEMORRHOIDS-EXTERNAL 02/21/2010  . NAUSEA 02/21/2010  . ABDOMINAL PAIN -GENERALIZED 02/21/2010  . PERSONAL HX COLONIC POLYPS 02/21/2010  . ANEMIA 04/16/2007  . Essential hypertension 04/16/2007  . DIVERTICULOSIS, COLON 04/16/2007  . ARTHRITIS 04/16/2007  . INTERNAL HEMORRHOIDS WITHOUT MENTION COMP 04/12/2007  . Asthma 04/12/2007  . Cardiac resynchronization therapy pacemaker (CRT-P) in place 04/12/2007  . Gastritis without bleeding 12/14/2006  . COLONIC POLYPS, HYPERPLASTIC 09/27/2002  . FATTY LIVER DISEASE 02/16/2002   Ruben Im, PT 10/28/18 8:21 PM Phone: [phone] Fax: [phone] Alvera Singh 10/28/2018, 8:20  PM  [facility] [address] [place], [place], [address] Phone: [phone]   Fax:  [phone]  Name: [name] MRN: [id number] Date of Birth: 09/16/1945

## 2018-10-29 ENCOUNTER — Other Ambulatory Visit: Payer: Self-pay | Admitting: Family Medicine

## 2018-10-29 DIAGNOSIS — Z1231 Encounter for screening mammogram for malignant neoplasm of breast: Secondary | ICD-10-CM

## 2018-11-01 ENCOUNTER — Other Ambulatory Visit: Payer: Self-pay | Admitting: Cardiovascular Disease

## 2018-11-02 ENCOUNTER — Encounter: Payer: Self-pay | Admitting: Physical Therapy

## 2018-11-02 ENCOUNTER — Other Ambulatory Visit: Payer: Self-pay

## 2018-11-02 ENCOUNTER — Ambulatory Visit: Payer: Medicare HMO | Admitting: Physical Therapy

## 2018-11-02 DIAGNOSIS — M6281 Muscle weakness (generalized): Secondary | ICD-10-CM

## 2018-11-02 DIAGNOSIS — R262 Difficulty in walking, not elsewhere classified: Secondary | ICD-10-CM | POA: Diagnosis not present

## 2018-11-02 DIAGNOSIS — R296 Repeated falls: Secondary | ICD-10-CM

## 2018-11-02 NOTE — Therapy (Signed)
[facility] [address], [place], [place], [address] Phone: [phone]   Fax:  [phone]  Physical Therapy Treatment  Patient Details  Name: [name] MRN: [id number] Date of Birth: 02/03/1946 Referring Provider (PT): Dr. Dorthy Cooler   Encounter Date: 11/02/2018  PT End of Session - 11/02/18 1738    Visit Number  2    Date for PT Re-Evaluation  12/23/18    Authorization Type  [employer]    PT Start Time  1450   pt late   PT Stop Time  1518    PT Time Calculation (min)  28 min    Activity Tolerance  Patient limited by fatigue       Past Medical History:  Diagnosis Date  . AICD (automatic cardioverter/defibrillator) present   . Anemia   . Arthritis   . Asthma   . CAD (coronary artery disease)    a. 10/09/16 LHC: SVG->LAD patent, SVG->Diag patent, SVG->RCA patent, SVG->LCx occluded. EF 60%, b. 10/31/16 LHC DES to AV groove Circ, DES to intermed branch  . Cellulitis and abscess of foot 12/2014   RT FOOT  . CHF (congestive heart failure) ([place])   . Chronic bronchitis ([place])   . Chronic lower back pain   . Diverticulosis   . Facial numbness 02/2016  . Fatty liver disease, nonalcoholic   . Gastritis   . GERD (gastroesophageal reflux disease)   . Gout   . H/O hiatal hernia   . High cholesterol   . Hyperplastic colon polyp   . Hypertension   . IBS (irritable bowel syndrome)   . Internal hemorrhoids   . Ischemic cardiomyopathy   . Obesity   . On home oxygen therapy    "2L at night" (10/31/2016)  . OSA (obstructive sleep apnea)    "can't tolerate a mask" (10/30/2016)  . Pneumonia    "couple times" (10/31/2016)  . Shortness of breath   . TIA (transient ischemic attack)    "recently" (10/31/2016)  . Type II diabetes mellitus ([place])     Past Surgical History:  Procedure Laterality Date  . ABDOMINAL ULTRASOUND  12/01/2011   Peripelvic cysts- #1- 2.4x1.9x2.3cm, #2-1.2x0.9x1.2cm  . ANKLE FRACTURE SURGERY Right     "had rod put in"  . ANTERIOR CERVICAL DECOMP/DISCECTOMY FUSION    . APPENDECTOMY    . BACK SURGERY    . BIOPSY  12/29/2017   Procedure: BIOPSY;  Surgeon: [name], MD;  Location: [facility];  Service: Endoscopy;;  . BIV ICD INSERTION CRT-D  2001?  [name] BIV PACEMAKER GENERATOR CHANGE OUT N/A 11/02/2012   Procedure: BIV PACEMAKER GENERATOR CHANGE OUT;  Surgeon: Sanda Klein, MD;  Location: [place] [facility] CATH LAB;  Service: Cardiovascular;  Laterality: N/A;  . CARDIAC CATHETERIZATION  05/17/1999   No significant coronary obstructive disease w/ mild 20% luminal irregularity of the first diag branch of the LAD  . CARDIAC CATHETERIZATION  07/08/2002   No significant CAD, moderately depressed LV systolic function  . CARDIAC CATHETERIZATION Bilateral 04/26/2007   Normal findings, recommend medical therapy  . CARDIAC CATHETERIZATION  02/18/2008   Moderate CAD, would benefit from having a functional study, recommend continue medical therapy  . CARDIAC CATHETERIZATION  07/23/2012   Medical therapy  . CARDIAC CATHETERIZATION N/A 11/24/2014   Procedure: Left Heart Cath and Coronary Angiography;  Surgeon: Troy Sine, MD;  Location: [place] CV LAB;  Service: Cardiovascular;  Laterality: N/A;  . CARDIAC CATHETERIZATION  11/27/2014   Procedure: Intravascular  Pressure Wire/FFR Study;  Surgeon: Peter M Martinique, MD;  Location: [place] [facility];  Service: Cardiovascular;;  . CARDIAC CATHETERIZATION  10/09/2016  . CHOLECYSTECTOMY N/A 04/09/2018   Procedure: LAPAROSCOPIC CHOLECYSTECTOMY;  Surgeon: Greer Pickerel, MD;  Location: [facility];  Service: General;  Laterality: N/A;  . COLONOSCOPY WITH PROPOFOL N/A 12/29/2017   Procedure: COLONOSCOPY WITH PROPOFOL;  Surgeon: [name], MD;  [facility]: [facility];  Service: Endoscopy;  Laterality: N/A;  . CORONARY ANGIOGRAM  2010  . CORONARY ARTERY BYPASS GRAFT N/A 11/29/2014   Procedure: CORONARY ARTERY BYPASS GRAFTING x 5 (LIMA-LAD, SVG-D, SVG-OM1-OM2,  SVG-PD);  Surgeon: Melrose Nakayama, MD;  Location: [place];  Service: Open Heart Surgery;  Laterality: N/A;  . CORONARY STENT INTERVENTION N/A 10/31/2016   Procedure: CORONARY STENT INTERVENTION;  Surgeon: Burnell Blanks, MD;  Location: [place] [facility];  Service: Cardiovascular;  Laterality: N/A;  . ESOPHAGOGASTRODUODENOSCOPY (EGD) WITH PROPOFOL N/A 12/29/2017   Procedure: ESOPHAGOGASTRODUODENOSCOPY (EGD) WITH PROPOFOL;  Surgeon: [name], MD;  [facility];  Service: Endoscopy;  Laterality: N/A;  . FRACTURE SURGERY    . INSERT / REPLACE / [place]  . KNEE ARTHROSCOPY Bilateral   . LEFT HEART CATH AND CORS/GRAFTS ANGIOGRAPHY N/A 12/09/2017   Procedure: LEFT HEART CATH AND CORS/GRAFTS ANGIOGRAPHY;  Surgeon: Troy Sine, MD;  Location: [place] [facility] [facility];  Service: Cardiovascular;  Laterality: N/A;  . LEFT HEART CATHETERIZATION WITH CORONARY ANGIOGRAM N/A 07/23/2012   Procedure: LEFT HEART CATHETERIZATION WITH CORONARY ANGIOGRAM;  Surgeon: Leonie Man, MD;  Location: [facility] CATH LAB;  Service: Cardiovascular;  Laterality: N/A;  Carlton Adam MYOVIEW  11/14/2011   Mild-moderate defect seen in Mid Inferolateral and Mid Anterolateral regions-consistant w/ infarct/scar. No significant ischemia demonstrated.  [name] POLYPECTOMY  12/29/2017   Procedure: POLYPECTOMY;  Surgeon: [name], MD;  [facility];  Service: Endoscopy;;  . RIGHT/LEFT HEART CATH AND CORONARY ANGIOGRAPHY N/A 10/09/2016   Procedure: RIGHT/LEFT HEART CATH AND CORONARY ANGIOGRAPHY;  Surgeon: Jolaine Artist, MD;  Location: [place] [facility];  Service: Cardiovascular;  Laterality: N/A;  . TEE WITHOUT CARDIOVERSION N/A 11/29/2014   Procedure: TRANSESOPHAGEAL ECHOCARDIOGRAM (TEE);  Surgeon: Melrose Nakayama, MD;  Location: [place];  Service: Open Heart Surgery;  Laterality: N/A;  . TRANSTHORACIC ECHOCARDIOGRAM  07/23/2012   EF 55-60%, normal-mild  . TUBAL LIGATION       There were no vitals filed for this visit.  Subjective Assessment - 11/02/18 1452    Subjective  Patient was 20 min late today due to transportation.  Patient short of breath on arrival.  Pulse Ox taken 95%    Currently in Pain?  No/denies   took Tylenol   Pain Score  0-No pain    Pain Location  Back                       [facility] Adult PT Treatment/Exercise - 11/02/18 0001      Knee/Hip Exercises: Stretches   Active Hamstring Stretch  Right;Left;5 reps    Active Hamstring Stretch Limitations  seated      Knee/Hip Exercises: Aerobic   Nustep  30 sec 3x seat 8, arms 3    shortness of breath; O2 on      Knee/Hip Exercises: Seated   Long Arc Quad  AROM;Right;Left;10 reps    Long Arc Quad Weight  0 lbs.    Ball Squeeze  4x   discontinued secondary  to pain    Clamshell with TheraBand  Green   15x   Marching  AROM;Right;Left;5 reps    Sit to Sand  10 reps;without UE support               PT Short Term Goals - 10/28/18 2008      PT SHORT TERM GOAL #1   Title  The patient will demonstrate compliance with initial HEP for strengthening of core and LE    Time  4    Period  Weeks    Status  New    Target Date  11/25/18      PT SHORT TERM GOAL #2   Title  The patient will be able to ambulate 160 feet with RW with supervision    Time  4    Period  Weeks    Status  New      PT SHORT TERM GOAL #3   Title  Improved BERG balance test to 43/56 indicating improved safety with home and communtiy  mobility    Time  4    Period  Weeks      PT SHORT TERM GOAL #4   Title  Activities Based Confidence Scale score improved to 54%    Time  4    Period  Weeks    Status  New        PT Long Term Goals - 10/28/18 2014      PT LONG TERM GOAL #1   Title  The patient will be independent with safe self progression of HEP    Time  8    Period  Weeks    Status  New    Target Date  12/23/18      PT LONG TERM GOAL #2   Title  The patient will be able to  ambulate 250 feet needed to attend church    Time  8    Period  Weeks    Status  New      PT LONG TERM GOAL #3   Title  Improved LE strength to grossly 4/5 to 4+/5 as indicated by increased 5x sit to stand speed to 15 sec    Time  8    Period  Weeks    Status  New      PT LONG TERM GOAL #4   Title  BERG balance score improved to 46/56 indicating improved gait safety and decreased risk of falls    Time  8    Period  Weeks    Status  New      PT LONG TERM GOAL #5   Title  Activities Based Confidence Scale improved to 59%    Time  8    Period  Weeks    Status  New            Plan - 11/02/18 1459    Clinical Impression Statement  The patient arrives late secondary to transportation reasons.  She arrives short of breath despite portable O2 but sat rate is 95%.  She is able to perform low level seated ex which is the most comfortable position for her back.  Therapist closely monitoring response with all interventions and modifying ex's secondary to pain or shortness of breath.Anticipate a slow progression secondary to numerous co-morbidities.    Comorbidities  cardiac history with bypass surgery 2 years ago, diabetes, HTN, cellulitis, kidney failure, O2 dependent; obesity, asthma; pacemaker    PT Frequency  2x / week  PT Duration  8 weeks    PT Treatment/Interventions  ADLs/Self Care Home Management;Gait training;DME Instruction;Functional mobility training;Stair training;Therapeutic activities;Therapeutic exercise;Neuromuscular re-education;Manual techniques;Patient/family education    PT Next Visit Plan  sitting ex review of initial HEP;   low level standing balance in short bouts secondary to pain with prolonged standing;  Nu-Step;  periodic BP and O2 saturation checks       Patient will benefit from skilled therapeutic intervention in order to improve the following deficits and impairments:  Obesity, Pain, Decreased strength, Decreased activity tolerance, Difficulty walking,  Impaired perceived functional ability  Visit Diagnosis: Muscle weakness (generalized)  Difficulty in walking, not elsewhere classified  Repeated falls     Problem List Patient Active Problem List   Diagnosis Date Noted  . Constipation 10/21/2018  . Leukopenia 05/03/2018  . Thrombocytopenia ([place]) 05/03/2018  . Pneumonia due to human metapneumovirus 05/02/2018  . Chest pain 05/01/2018  . Allergic rhinitis 04/13/2018  . S/P laparoscopic cholecystectomy 04/09/2018  . Chronic respiratory failure with hypoxia and hypercapnia ([place]) 03/07/2018  . Polyp of cecum   . Polyp of transverse colon   . Positive colorectal cancer screening using Cologuard test   . Dysphagia   . Acute on chronic renal failure ([place]) 11/20/2017  . CAD (coronary artery disease) 11/20/2017  . DM2 (diabetes mellitus, type 2) ([place]) 11/20/2017  . Abdominal pain, epigastric 11/20/2017  . Vasomotor rhinitis 10/16/2016  . Right facial numbness 03/05/2016  . CKD (chronic kidney disease), stage III ([place]) 03/05/2016  . Atrial fibrillation ([place]) 10/24/2015  . Chest pain with moderate risk of acute coronary syndrome 10/23/2015  . History of TIA (transient ischemic attack) 10/22/2015  . Complete heart block ([place]) 06/22/2015  . Allergic drug rash due to anti-infective agent 04/29/2015  . Fatigue 02/14/2015  . Chronic diastolic CHF (congestive heart failure) ([place]) 02/14/2015  . Cellulitis 12/21/2014  . S/P CABG x 5 11/29/2014  . CAD S/P percutaneous coronary angioplasty   . Diarrhea 07/12/2014  . Generalized abdominal pain 07/12/2014  . Diastolic CHF, acute on chronic (H[facility]) 11/04/2013  . Mixed hypercholesterolemia and hypertriglyceridemia 04/22/2013  . Vertigo 04/22/2013  . Dyspnea on exertion 08/25/2012  . Allergic to IV contrast 07/23/2012  . Unstable angina ([place]) 07/22/2012  . Morbid (severe) obesity due to excess calories ([place]) 11/22/2011  . Nonischemic cardiomyopathy ([place]) 11/22/2011  . Gastroesophageal reflux  disease 11/22/2011  . Back pain 11/22/2011  . OSA (obstructive sleep apnea) 11/22/2011  . HEMORRHOIDS-EXTERNAL 02/21/2010  . NAUSEA 02/21/2010  . ABDOMINAL PAIN -GENERALIZED 02/21/2010  . PERSONAL HX COLONIC POLYPS 02/21/2010  . ANEMIA 04/16/2007  . Essential hypertension 04/16/2007  . DIVERTICULOSIS, COLON 04/16/2007  . ARTHRITIS 04/16/2007  . INTERNAL HEMORRHOIDS WITHOUT MENTION COMP 04/12/2007  . Asthma 04/12/2007  . Cardiac resynchronization therapy pacemaker (CRT-P) in place 04/12/2007  . Gastritis without bleeding 12/14/2006  . COLONIC POLYPS, HYPERPLASTIC 09/27/2002  . FATTY LIVER DISEASE 02/16/2002   Ruben Im, PT 11/02/18 5:44 PM Phone: [phone] Fax: [phone] Alvera Singh 11/02/2018, 5:44 PM  [facility] [address], [place], [place], [address] Phone: [phone]   Fax:  [phone]  Name: LAQUIA ROSANO MRN: [id number] Date of Birth: 06-23-1945

## 2018-11-03 ENCOUNTER — Ambulatory Visit: Payer: Medicare HMO | Admitting: Gastroenterology

## 2018-11-04 ENCOUNTER — Other Ambulatory Visit: Payer: Self-pay

## 2018-11-04 ENCOUNTER — Ambulatory Visit
Admission: RE | Admit: 2018-11-04 | Discharge: 2018-11-04 | Disposition: A | Discharge status: Home / Self Care | Payer: Medicare HMO | Source: Ambulatory Visit | Attending: Family Medicine | Admitting: Family Medicine

## 2018-11-04 DIAGNOSIS — Z1231 Encounter for screening mammogram for malignant neoplasm of breast: Secondary | ICD-10-CM

## 2018-11-06 DIAGNOSIS — I5032 Chronic diastolic (congestive) heart failure: Secondary | ICD-10-CM | POA: Diagnosis not present

## 2018-11-06 DIAGNOSIS — J9611 Chronic respiratory failure with hypoxia: Secondary | ICD-10-CM | POA: Diagnosis not present

## 2018-11-06 DIAGNOSIS — R269 Unspecified abnormalities of gait and mobility: Secondary | ICD-10-CM | POA: Diagnosis not present

## 2018-11-06 DIAGNOSIS — R062 Wheezing: Secondary | ICD-10-CM | POA: Diagnosis not present

## 2018-11-09 ENCOUNTER — Ambulatory Visit: Payer: Medicare HMO | Admitting: Physical Therapy

## 2018-11-09 ENCOUNTER — Other Ambulatory Visit: Payer: Self-pay

## 2018-11-09 DIAGNOSIS — M6281 Muscle weakness (generalized): Secondary | ICD-10-CM | POA: Diagnosis not present

## 2018-11-09 DIAGNOSIS — R262 Difficulty in walking, not elsewhere classified: Secondary | ICD-10-CM

## 2018-11-09 DIAGNOSIS — R296 Repeated falls: Secondary | ICD-10-CM

## 2018-11-09 IMAGING — CT CT ABD-PELV W/O CM
2 of 4 series · 17 of 46 positions shown, 19 images · non-contrast
Comparison: 09/19/2015 CT abdomen and pelvis.

CLINICAL DATA: 72 y/o F; generalized abdominal pain and diarrhea
for 3-4 days.

EXAM:
CT ABDOMEN AND PELVIS WITHOUT CONTRAST
TECHNIQUE: Multidetector CT imaging of the abdomen and pelvis was performed
following the standard protocol without IV contrast.

[Series 2: axial st · axial · 0.79mm/px · z∈[+1194,+1568]mm · 14 of 83 slices shown, 16 images]
[im 4/83  soft-tissue]
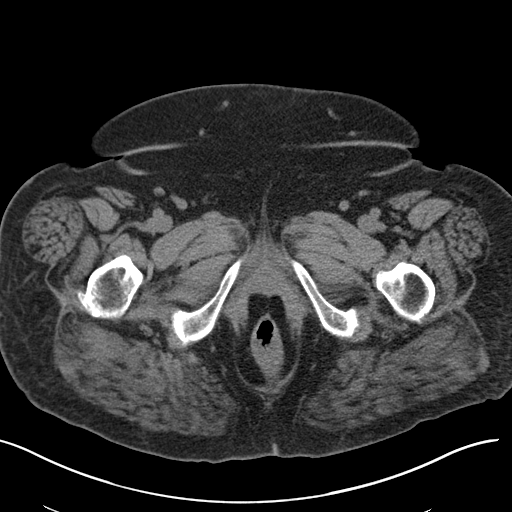
[im 4/83  bone]
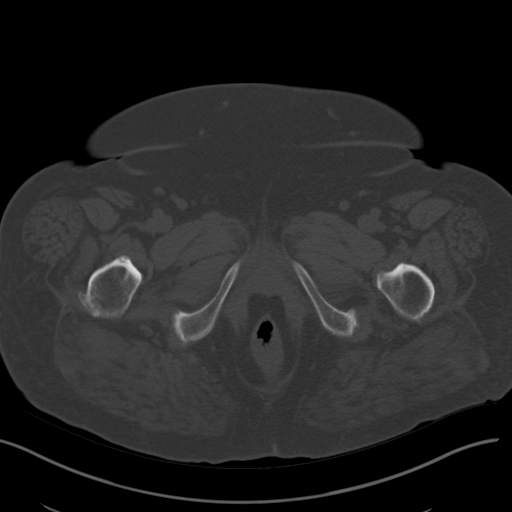
[im 12/83  soft-tissue]
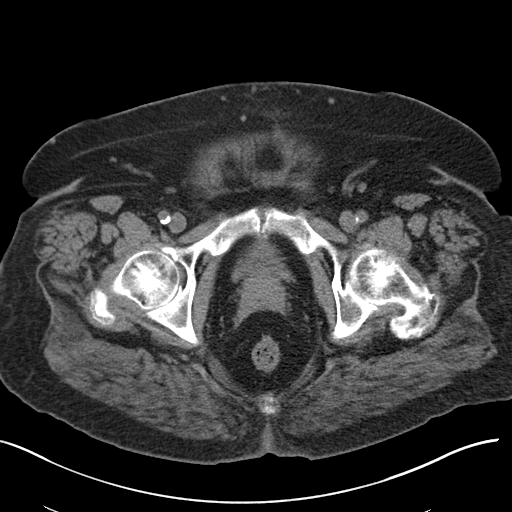
[im 15/83  soft-tissue]
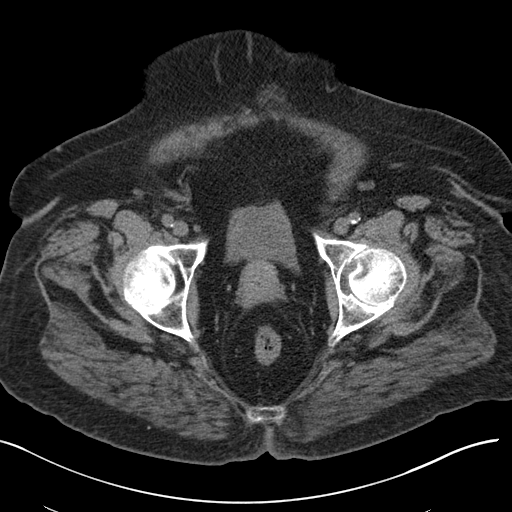
[im 23/83  soft-tissue]
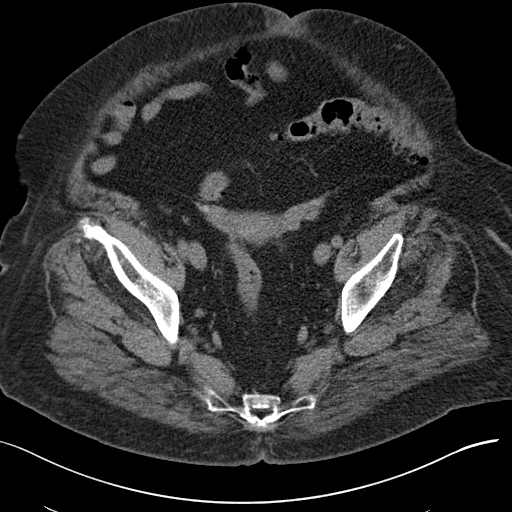
[im 27/83  soft-tissue]
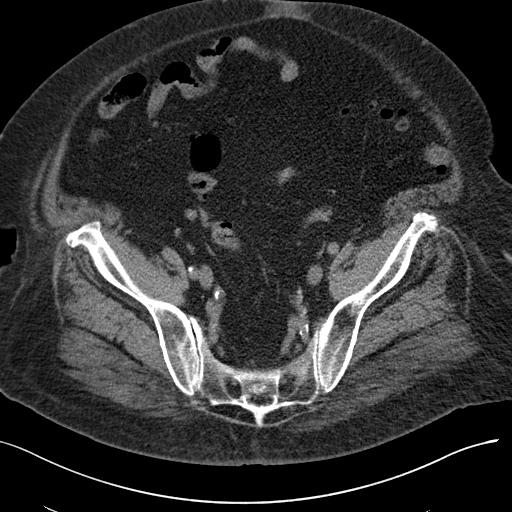
[im 34/83  soft-tissue]
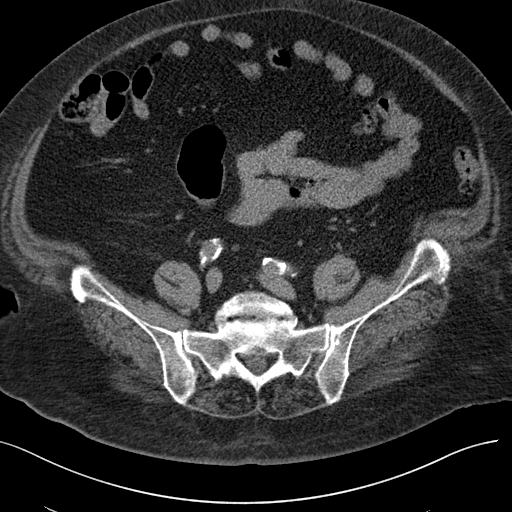
[im 38/83  soft-tissue]
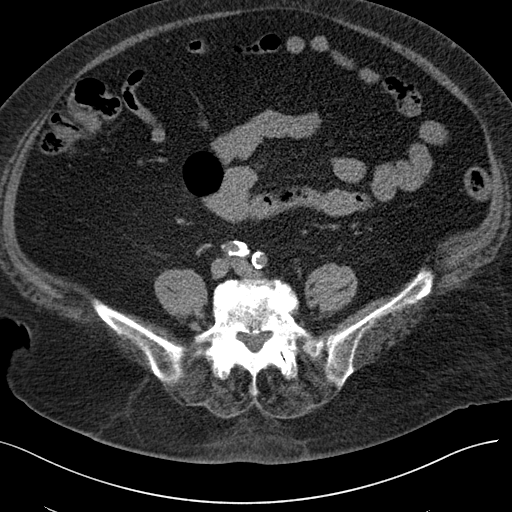
[im 45/83  soft-tissue]
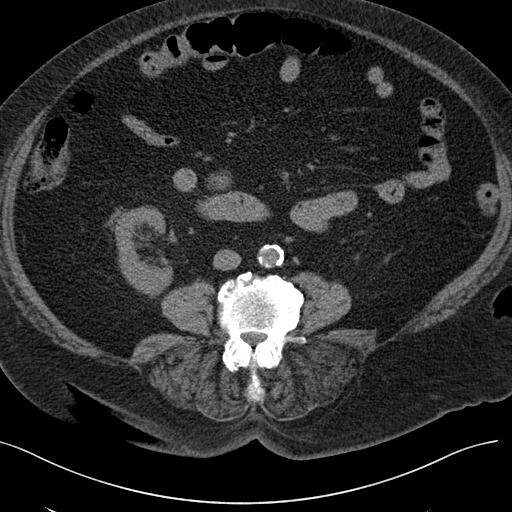
[im 49/83  soft-tissue]
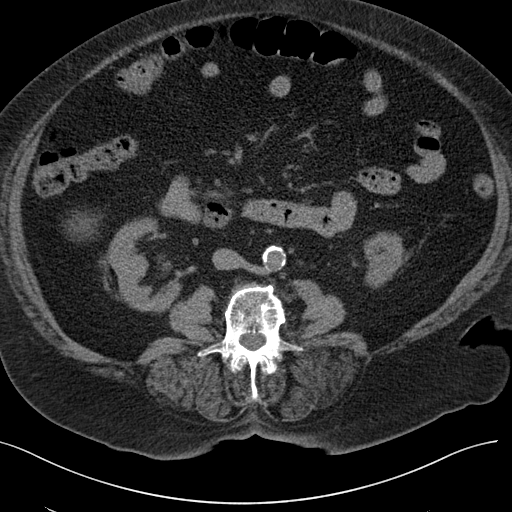
[im 49/83  bone]
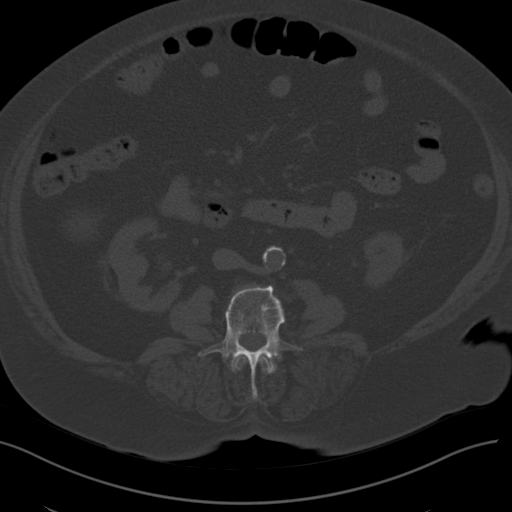
[im 56/83  soft-tissue]
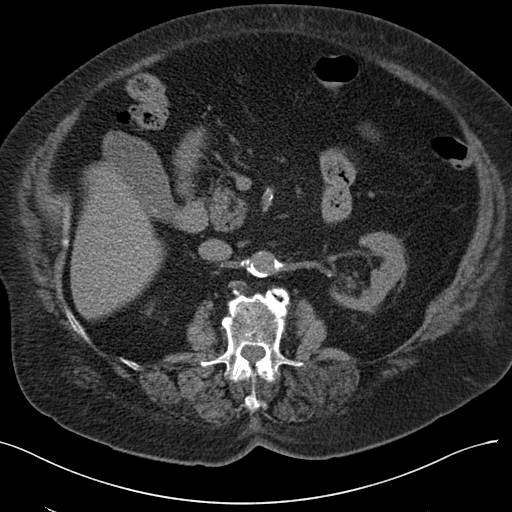
[im 60/83  soft-tissue]
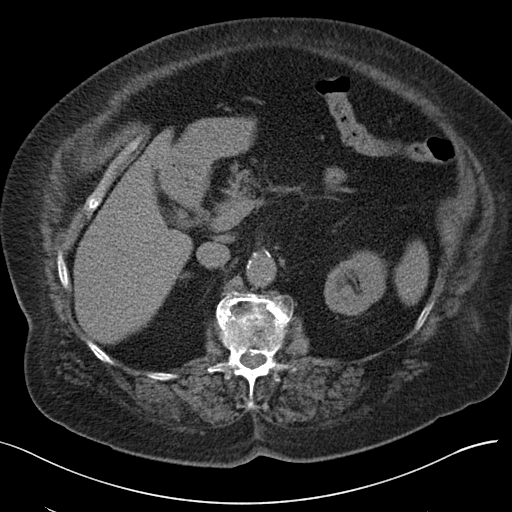
[im 68/83  soft-tissue]
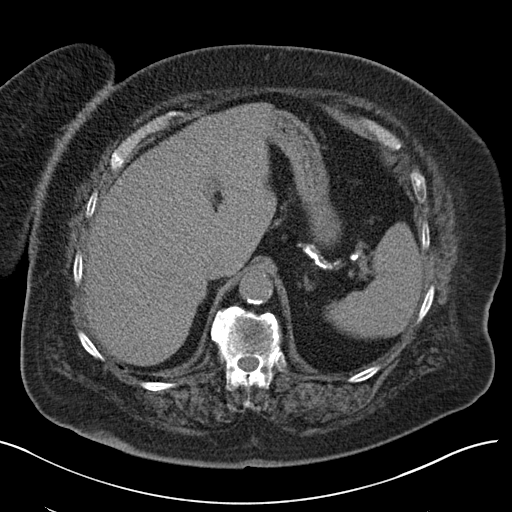
[im 71/83  soft-tissue]
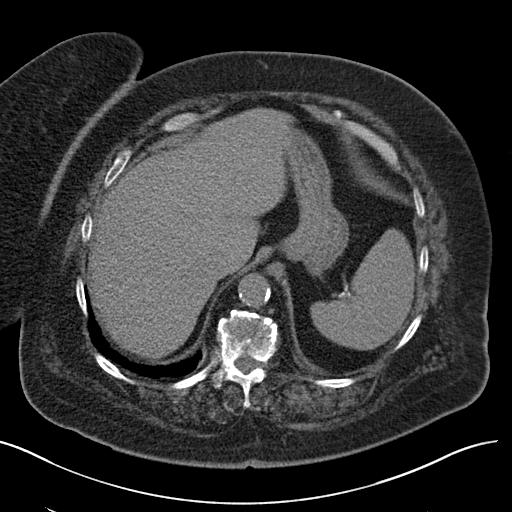
[im 79/83  soft-tissue]
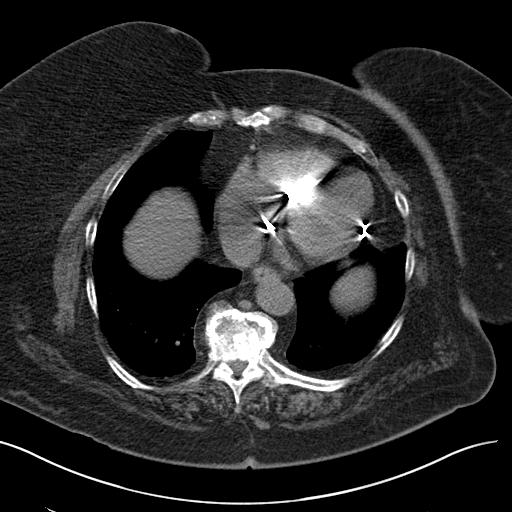

[Series 4: coronal st · coronal · 0.82mm/px · 3 of 119 slices shown]
[im 40/119  soft-tissue]
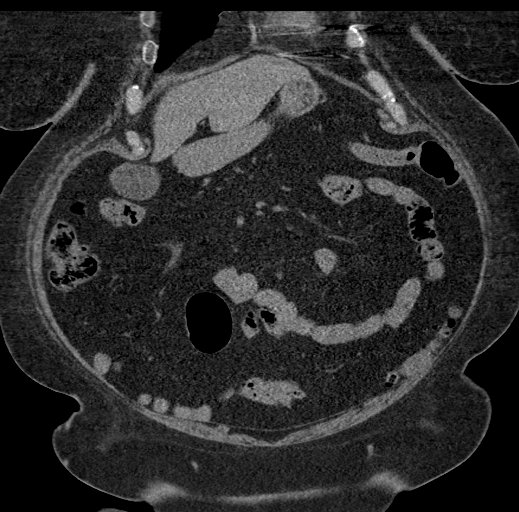
[im 53/119  soft-tissue]
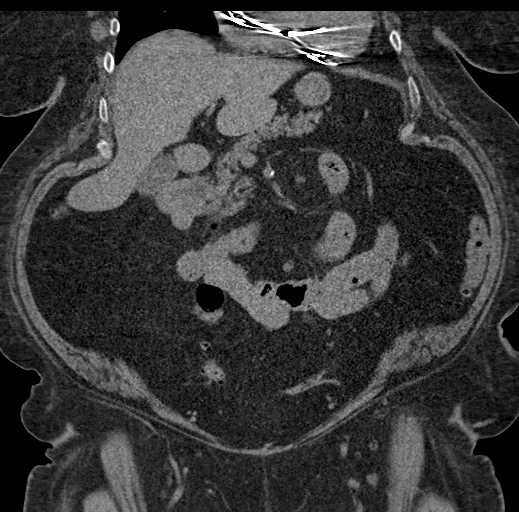
[im 66/119  soft-tissue]
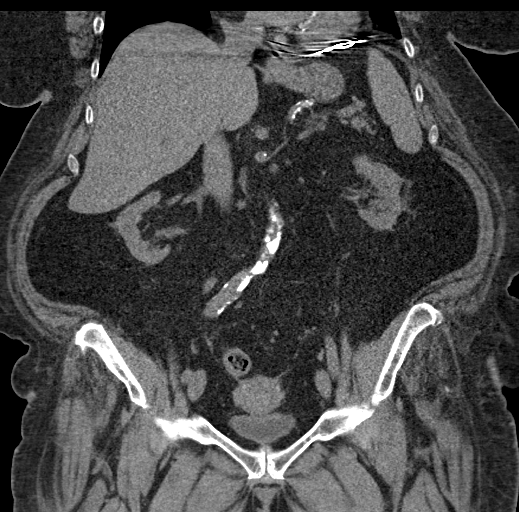

[17 of 46 positions shown; findings below may reference images not displayed]

FINDINGS: Lower chest: Median sternotomy, 3 lead AICD, mitral annular
calcification, coronary artery calcific atherosclerosis. Stable 5
and 3 mm pulmonary nodules in the right lower lobe compatible with
benign etiology.

Hepatobiliary: No focal liver abnormality is seen. Cholelithiasis.
No gallbladder wall thickening or biliary ductal dilatation.

Pancreas: Unremarkable. No pancreatic ductal dilatation or
surrounding inflammatory changes.

Spleen: Normal in size without focal abnormality.

Adrenals/Urinary Tract: Adrenal glands are unremarkable. 10 mm left
kidney interpolar cyst. Otherwise kidneys are normal, without renal
calculi, focal lesion, or hydronephrosis. Bladder is unremarkable.

Stomach/Bowel: 21 mm diverticulum arising from third segment of
duodenum is stable. Mild sigmoid diverticulosis without findings of
acute diverticulitis. No bowel wall thickening or inflammatory
changes identified.

Vascular/Lymphatic: Aortic atherosclerosis. No enlarged abdominal or
pelvic lymph nodes.

Reproductive: Uterus and bilateral adnexa are unremarkable.

Other: No abdominal wall hernia or abnormality. No abdominopelvic
ascites.

Musculoskeletal: Advanced degenerative changes of the spine and
grade 1 L4-5 anterolisthesis are stable. Multilevel spinal canal and
foraminal stenosis. No acute osseous abnormality.
IMPRESSION: 1. No acute process identified as explanation for abdominal pain.
2. Cholelithiasis.
3. Stable duodenal and sigmoid colon diverticulosis.
4. Aortic Atherosclerosis ([id number]-01Z.Z).

By: Edi Mehmood M.D.

## 2018-11-09 NOTE — Therapy (Signed)
[facility] [address], [place], [place], [address] Phone: [phone]   Fax:  [phone]  Physical Therapy Treatment  Patient Details  Name: [name] MRN: [id number] Date of Birth: Apr 01, 1945 Referring Provider (PT): Dr. Dorthy Cooler   Encounter Date: 11/09/2018  PT End of Session - 11/09/18 1542    Visit Number  3    Date for PT Re-Evaluation  12/23/18    Authorization Type  [employer]    PT Start Time  1510    PT Stop Time  7893    PT Time Calculation (min)  38 min    Activity Tolerance  Patient tolerated treatment well       Past Medical History:  Diagnosis Date  . AICD (automatic cardioverter/defibrillator) present   . Anemia   . Arthritis   . Asthma   . CAD (coronary artery disease)    a. 10/09/16 LHC: SVG->LAD patent, SVG->Diag patent, SVG->RCA patent, SVG->LCx occluded. EF 60%, b. 10/31/16 LHC DES to AV groove Circ, DES to intermed branch  . Cellulitis and abscess of foot 12/2014   RT FOOT  . CHF (congestive heart failure) ([place])   . Chronic bronchitis ([place])   . Chronic lower back pain   . Diverticulosis   . Facial numbness 02/2016  . Fatty liver disease, nonalcoholic   . Gastritis   . GERD (gastroesophageal reflux disease)   . Gout   . H/O hiatal hernia   . High cholesterol   . Hyperplastic colon polyp   . Hypertension   . IBS (irritable bowel syndrome)   . Internal hemorrhoids   . Ischemic cardiomyopathy   . Obesity   . On home oxygen therapy    "2L at night" (10/31/2016)  . OSA (obstructive sleep apnea)    "can't tolerate a mask" (10/30/2016)  . Pneumonia    "couple times" (10/31/2016)  . Shortness of breath   . TIA (transient ischemic attack)    "recently" (10/31/2016)  . Type II diabetes mellitus ([place])     Past Surgical History:  Procedure Laterality Date  . ABDOMINAL ULTRASOUND  12/01/2011   Peripelvic cysts- #1- 2.4x1.9x2.3cm, #2-1.2x0.9x1.2cm  . ANKLE FRACTURE SURGERY Right     "had rod put in"  . ANTERIOR CERVICAL DECOMP/DISCECTOMY FUSION    . APPENDECTOMY    . BACK SURGERY    . BIOPSY  12/29/2017   Procedure: BIOPSY;  Surgeon: [name], MD;  Location: [facility];  Service: Endoscopy;;  . BIV ICD INSERTION CRT-D  2001?  [name] BIV PACEMAKER GENERATOR CHANGE OUT N/A 11/02/2012   Procedure: BIV PACEMAKER GENERATOR CHANGE OUT;  Surgeon: Sanda Klein, MD;  Location: [place] CATH LAB;  Service: Cardiovascular;  Laterality: N/A;  . CARDIAC CATHETERIZATION  05/17/1999   No significant coronary obstructive disease w/ mild 20% luminal irregularity of the first diag branch of the LAD  . CARDIAC CATHETERIZATION  07/08/2002   No significant CAD, moderately depressed LV systolic function  . CARDIAC CATHETERIZATION Bilateral 04/26/2007   Normal findings, recommend medical therapy  . CARDIAC CATHETERIZATION  02/18/2008   Moderate CAD, would benefit from having a functional study, recommend continue medical therapy  . CARDIAC CATHETERIZATION  07/23/2012   Medical therapy  . CARDIAC CATHETERIZATION N/A 11/24/2014   Procedure: Left Heart Cath and Coronary Angiography;  Surgeon: Troy Sine, MD;  Location: [place] CV [facility];  Service: Cardiovascular;  Laterality: N/A;  . CARDIAC CATHETERIZATION  11/27/2014   Procedure: Intravascular Pressure Wire/FFR Study;  Surgeon: Peter M Martinique, MD;  Location: [place] CV LAB;  Service: Cardiovascular;;  . CARDIAC CATHETERIZATION  10/09/2016  . CHOLECYSTECTOMY N/A 04/09/2018   Procedure: LAPAROSCOPIC CHOLECYSTECTOMY;  Surgeon: Greer Pickerel, MD;  Location: [facility];  Service: General;  Laterality: N/A;  . COLONOSCOPY WITH PROPOFOL N/A 12/29/2017   Procedure: COLONOSCOPY WITH PROPOFOL;  Surgeon: [name], MD;  Location: [facility];  Service: Endoscopy;  Laterality: N/A;  . CORONARY ANGIOGRAM  2010  . CORONARY ARTERY BYPASS GRAFT N/A 11/29/2014   Procedure: CORONARY ARTERY BYPASS GRAFTING x 5 (LIMA-LAD, SVG-D, SVG-OM1-OM2,  SVG-PD);  Surgeon: Melrose Nakayama, MD;  Location: [place];  Service: Open Heart Surgery;  Laterality: N/A;  . CORONARY STENT INTERVENTION N/A 10/31/2016   Procedure: CORONARY STENT INTERVENTION;  Surgeon: Burnell Blanks, MD;  Location: [place] [facility] LAB;  Service: Cardiovascular;  Laterality: N/A;  . ESOPHAGOGASTRODUODENOSCOPY (EGD) WITH PROPOFOL N/A 12/29/2017   Procedure: ESOPHAGOGASTRODUODENOSCOPY (EGD) WITH PROPOFOL;  Surgeon: [name], MD;  [facility];  Service: Endoscopy;  Laterality: N/A;  . FRACTURE SURGERY    . INSERT / REPLACE / [place]  . KNEE ARTHROSCOPY Bilateral   . LEFT HEART CATH AND CORS/GRAFTS ANGIOGRAPHY N/A 12/09/2017   Procedure: LEFT HEART CATH AND CORS/GRAFTS ANGIOGRAPHY;  Surgeon: Troy Sine, MD;  Location: [place] CV LAB;  Service: Cardiovascular;  Laterality: N/A;  . LEFT HEART CATHETERIZATION WITH CORONARY ANGIOGRAM N/A 07/23/2012   Procedure: LEFT HEART CATHETERIZATION WITH CORONARY ANGIOGRAM;  Surgeon: Leonie Man, MD;  Location: [facility] CATH LAB;  Service: Cardiovascular;  Laterality: N/A;  Carlton Adam MYOVIEW  11/14/2011   Mild-moderate defect seen in Mid Inferolateral and Mid Anterolateral regions-consistant w/ infarct/scar. No significant ischemia demonstrated.  [name] POLYPECTOMY  12/29/2017   Procedure: POLYPECTOMY;  Surgeon: [name], MD;  [facility];  Service: Endoscopy;;  . RIGHT/LEFT HEART CATH AND CORONARY ANGIOGRAPHY N/A 10/09/2016   Procedure: RIGHT/LEFT HEART CATH AND CORONARY ANGIOGRAPHY;  Surgeon: Jolaine Artist, MD;  Location: [place] [facility] LAB;  Service: Cardiovascular;  Laterality: N/A;  . TEE WITHOUT CARDIOVERSION N/A 11/29/2014   Procedure: TRANSESOPHAGEAL ECHOCARDIOGRAM (TEE);  Surgeon: Melrose Nakayama, MD;  Location: [place];  Service: Open Heart Surgery;  Laterality: N/A;  . TRANSTHORACIC ECHOCARDIOGRAM  07/23/2012   EF 55-60%, normal-mild  . TUBAL LIGATION       There were no vitals filed for this visit.  Subjective Assessment - 11/09/18 1513    Subjective  I went to church day and night this week.  Not as tired as the last time.  I'm not as weak.  I cleaned the kitchen today and worked through the back pain.  I got some of those support hose and that helps a lot. Less burning in left LE.    Pertinent History  3L O2 dependent;  chronic LBP; diabetes;  CABG x5;  left ankle fracture; pacemaker    Currently in Pain?  Yes    Pain Score  2     Pain Location  Back    Pain Type  Chronic pain                       [facility] Adult PT Treatment/Exercise - 11/09/18 0001      Therapeutic Activites    Therapeutic Activities  ADL's    ADL's  walking, standing, sit to stand      Knee/Hip Exercises: Aerobic   Nustep  seat 8 L1 60 sec intervals 4x with 30 sec rest breaks between each interval       Knee/Hip Exercises: Standing   Hip Extension  AROM;Right;Left;10 reps    Other Standing Knee Exercises  step taps 10x     Other Standing Knee Exercises  side step at the treadmill 10x       Knee/Hip Exercises: Seated   Long Arc Quad  AROM;Right;Left;10 reps    Long Arc Quad Weight  0 lbs.    Other Seated Knee/Hip Exercises  foam roll push down 10x 5 sec hold    Other Seated Knee/Hip Exercises  2# red plyo ball UE chops to activate core hip to hip     Sit to Sand  10 reps;without UE support   from Nu-Step chair.                PT Short Term Goals - 10/28/18 2008      PT SHORT TERM GOAL #1   Title  The patient will demonstrate compliance with initial HEP for strengthening of core and LE    Time  4    Period  Weeks    Status  New    Target Date  11/25/18      PT SHORT TERM GOAL #2   Title  The patient will be able to ambulate 160 feet with RW with supervision    Time  4    Period  Weeks    Status  New      PT SHORT TERM GOAL #3   Title  Improved BERG balance test to 43/56 indicating improved safety with home and communtiy   mobility    Time  4    Period  Weeks      PT SHORT TERM GOAL #4   Title  Activities Based Confidence Scale score improved to 54%    Time  4    Period  Weeks    Status  New        PT Long Term Goals - 10/28/18 2014      PT LONG TERM GOAL #1   Title  The patient will be independent with safe self progression of HEP    Time  8    Period  Weeks    Status  New    Target Date  12/23/18      PT LONG TERM GOAL #2   Title  The patient will be able to ambulate 250 feet needed to attend church    Time  8    Period  Weeks    Status  New      PT LONG TERM GOAL #3   Title  Improved LE strength to grossly 4/5 to 4+/5 as indicated by increased 5x sit to stand speed to 15 sec    Time  8    Period  Weeks    Status  New      PT LONG TERM GOAL #4   Title  BERG balance score improved to 46/56 indicating improved gait safety and decreased risk of falls    Time  8    Period  Weeks    Status  New      PT LONG TERM GOAL #5   Title  Activities Based Confidence Scale improved to 59%    Time  8    Period  Weeks    Status  New            Plan - 11/09/18  1544    Clinical Impression Statement  The patient is able to perform a progression of strengthening exercises today including some standing ex's.  Standing tolerance is limited by LBP but she is able tolerate 3-4 minutes today before she needs to sit down for relief.  Therapist monitoring O2 sats periodically with levels remaining 93% or higher.  The patient is O2 dependent and on portable oxygen for the entire treatment session.  Therapist closely monitoring response throughout treatment session as needed for numerous co-morbidities.    Comorbidities  cardiac history with bypass surgery 2 years ago, diabetes, HTN, cellulitis, kidney failure, O2 dependent; obesity, asthma; pacemaker    Rehab Potential  Good    PT Frequency  2x / week    PT Duration  8 weeks    PT Treatment/Interventions  ADLs/Self Care Home Management;Gait training;DME  Instruction;Functional mobility training;Stair training;Therapeutic activities;Therapeutic exercise;Neuromuscular re-education;Manual techniques;Patient/family education    PT Next Visit Plan  sitting ex for core and LE strengthening;   low level standing balance in short bouts secondary to pain with prolonged standing;  Nu-Step;  periodic BP and O2 saturation checks       Patient will benefit from skilled therapeutic intervention in order to improve the following deficits and impairments:  Obesity, Pain, Decreased strength, Decreased activity tolerance, Difficulty walking, Impaired perceived functional ability  Visit Diagnosis: Muscle weakness (generalized)  Difficulty in walking, not elsewhere classified  Repeated falls     Problem List Patient Active Problem List   Diagnosis Date Noted  . Constipation 10/21/2018  . Leukopenia 05/03/2018  . Thrombocytopenia ([place]) 05/03/2018  . Pneumonia due to human metapneumovirus 05/02/2018  . Chest pain 05/01/2018  . Allergic rhinitis 04/13/2018  . S/P laparoscopic cholecystectomy 04/09/2018  . Chronic respiratory failure with hypoxia and hypercapnia ([place]) 03/07/2018  . Polyp of cecum   . Polyp of transverse colon   . Positive colorectal cancer screening using Cologuard test   . Dysphagia   . Acute on chronic renal failure ([place]) 11/20/2017  . CAD (coronary artery disease) 11/20/2017  . DM2 (diabetes mellitus, type 2) ([place]) 11/20/2017  . Abdominal pain, epigastric 11/20/2017  . Vasomotor rhinitis 10/16/2016  . Right facial numbness 03/05/2016  . CKD (chronic kidney disease), stage III ([place]) 03/05/2016  . Atrial fibrillation ([place]) 10/24/2015  . Chest pain with moderate risk of acute coronary syndrome 10/23/2015  . History of TIA (transient ischemic attack) 10/22/2015  . Complete heart block ([place]) 06/22/2015  . Allergic drug rash due to anti-infective agent 04/29/2015  . Fatigue 02/14/2015  . Chronic diastolic CHF (congestive heart  failure) ([place]) 02/14/2015  . Cellulitis 12/21/2014  . S/P CABG x 5 11/29/2014  . CAD S/P percutaneous coronary angioplasty   . Diarrhea 07/12/2014  . Generalized abdominal pain 07/12/2014  . Diastolic CHF, acute on chronic (HCC) 11/04/2013  . Mixed hypercholesterolemia and hypertriglyceridemia 04/22/2013  . Vertigo 04/22/2013  . Dyspnea on exertion 08/25/2012  . Allergic to IV contrast 07/23/2012  . Unstable angina ([place]) 07/22/2012  . Morbid (severe) obesity due to excess calories ([place]) 11/22/2011  . Nonischemic cardiomyopathy ([place]) 11/22/2011  . Gastroesophageal reflux disease 11/22/2011  . Back pain 11/22/2011  . OSA (obstructive sleep apnea) 11/22/2011  . HEMORRHOIDS-EXTERNAL 02/21/2010  . NAUSEA 02/21/2010  . ABDOMINAL PAIN -GENERALIZED 02/21/2010  . PERSONAL HX COLONIC POLYPS 02/21/2010  . ANEMIA 04/16/2007  . Essential hypertension 04/16/2007  . DIVERTICULOSIS, COLON 04/16/2007  . ARTHRITIS 04/16/2007  . INTERNAL HEMORRHOIDS WITHOUT MENTION COMP 04/12/2007  . Asthma  04/12/2007  . Cardiac resynchronization therapy pacemaker (CRT-P) in place 04/12/2007  . Gastritis without bleeding 12/14/2006  . COLONIC POLYPS, HYPERPLASTIC 09/27/2002  . FATTY LIVER DISEASE 02/16/2002   Ruben Im, PT 11/09/18 5:50 PM Phone: [phone] Fax: [phone] Alvera Singh 11/09/2018, 5:49 PM  [facility] [address] [place], [place], [address] Phone: [phone]   Fax:  [phone]  Name: [name] MRN: [id number] Date of Birth: [birth year]

## 2018-11-11 ENCOUNTER — Ambulatory Visit: Payer: Medicare HMO | Admitting: Gastroenterology

## 2018-11-15 ENCOUNTER — Ambulatory Visit: Payer: Medicare HMO | Admitting: Physical Therapy

## 2018-11-16 ENCOUNTER — Ambulatory Visit (INDEPENDENT_AMBULATORY_CARE_PROVIDER_SITE_OTHER): Payer: Medicare HMO | Admitting: *Deleted

## 2018-11-16 DIAGNOSIS — I442 Atrioventricular block, complete: Secondary | ICD-10-CM

## 2018-11-16 DIAGNOSIS — I5032 Chronic diastolic (congestive) heart failure: Secondary | ICD-10-CM

## 2018-11-16 IMAGING — CT CT ABD-PELV W/O CM
2 of 4 series · 17 of 46 positions shown, 19 images · non-contrast
Comparison: CT abdomen pelvis dated November 13, 2017.

CLINICAL DATA: Abdominal pain and nausea.

EXAM:
CT ABDOMEN AND PELVIS WITHOUT CONTRAST
TECHNIQUE: Multidetector CT imaging of the abdomen and pelvis was performed
following the standard protocol without IV contrast.

[Series 2: axial st · axial · 0.97mm/px · z∈[-660,-270]mm · 14 of 88 slices shown, 16 images]
[im 5/88  soft-tissue]
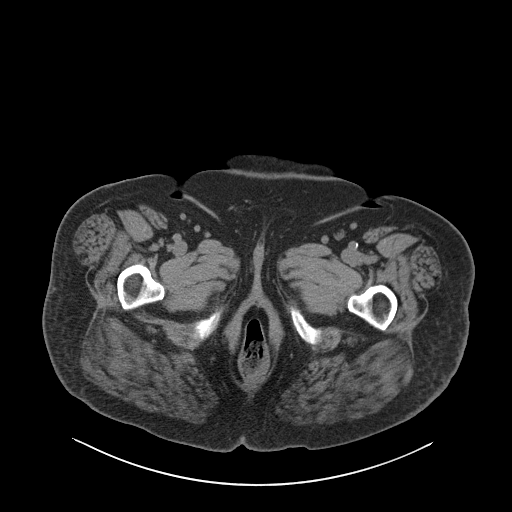
[im 5/88  bone]
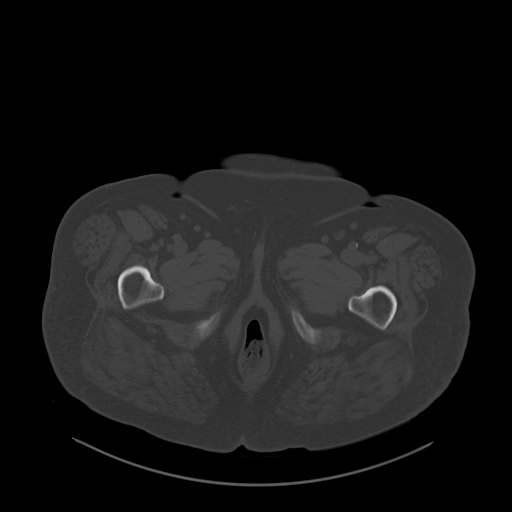
[im 10/88  soft-tissue]
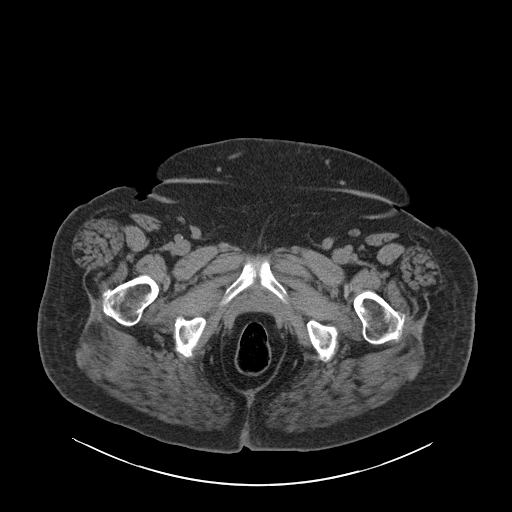
[im 19/88  soft-tissue]
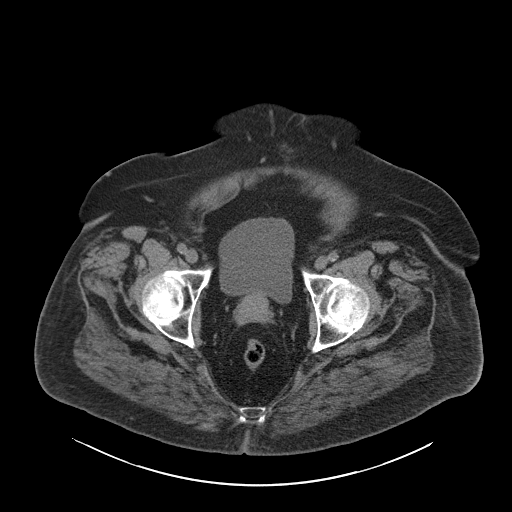
[im 23/88  soft-tissue]
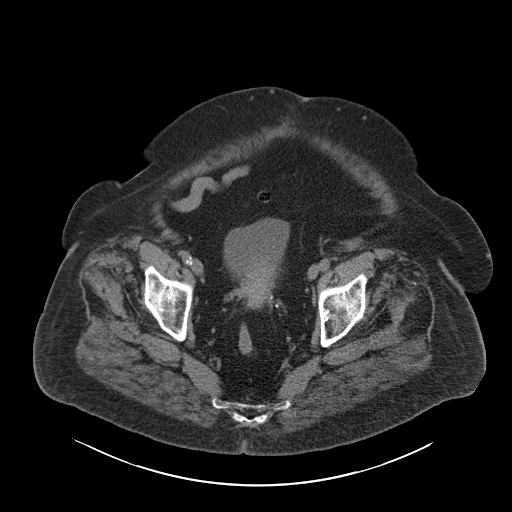
[im 28/88  soft-tissue]
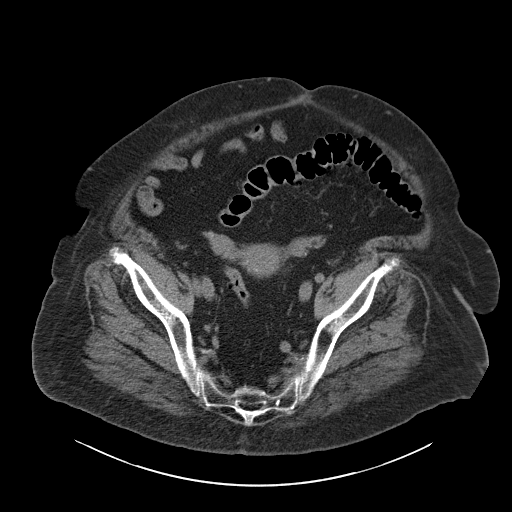
[im 37/88  soft-tissue]
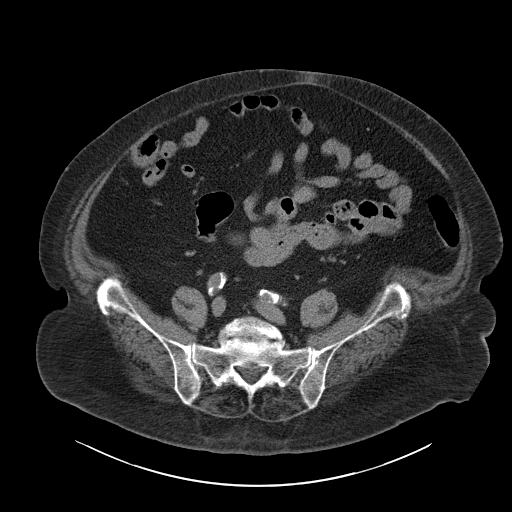
[im 42/88  soft-tissue]
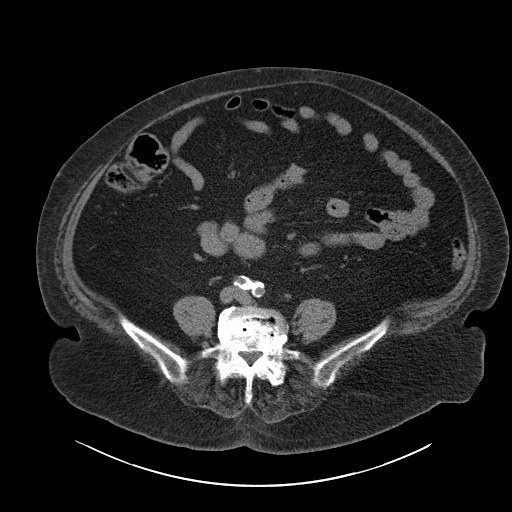
[im 46/88  soft-tissue]
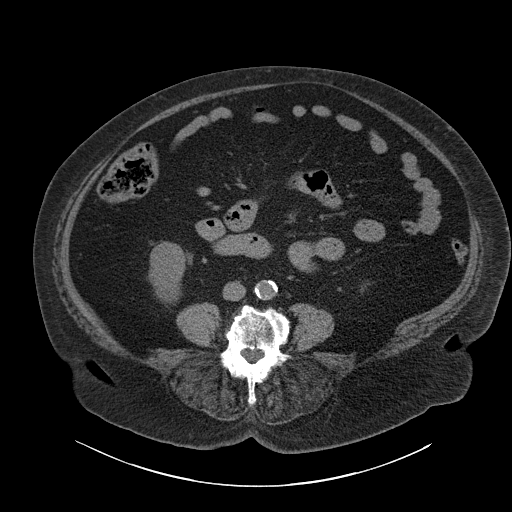
[im 51/88  soft-tissue]
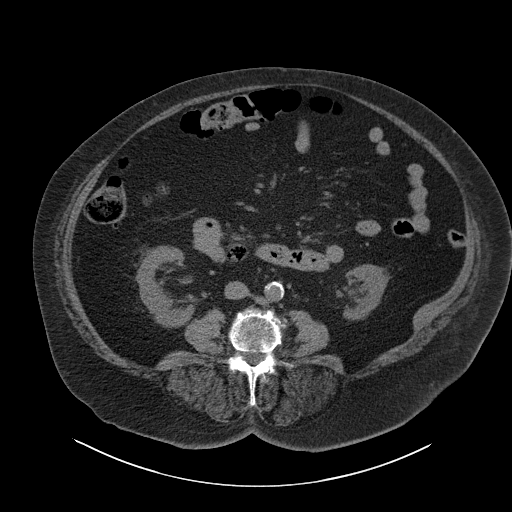
[im 51/88  bone]
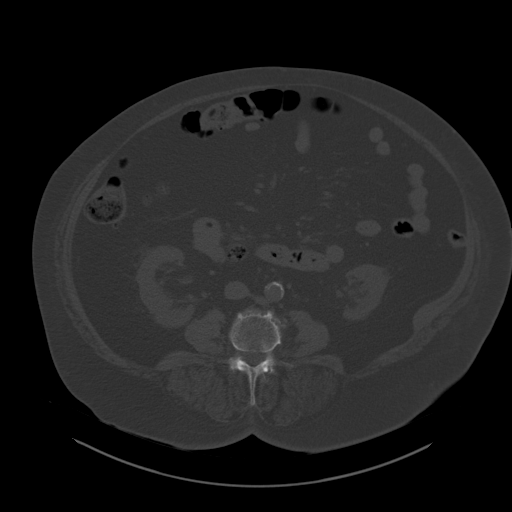
[im 60/88  soft-tissue]
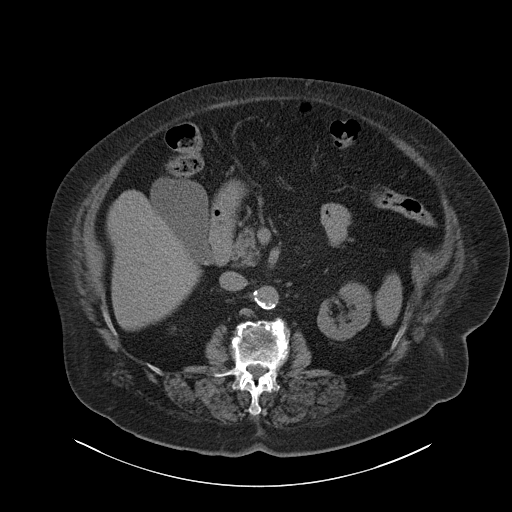
[im 65/88  soft-tissue]
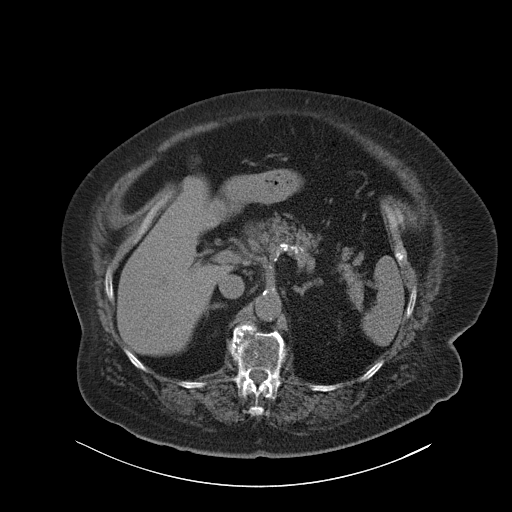
[im 69/88  soft-tissue]
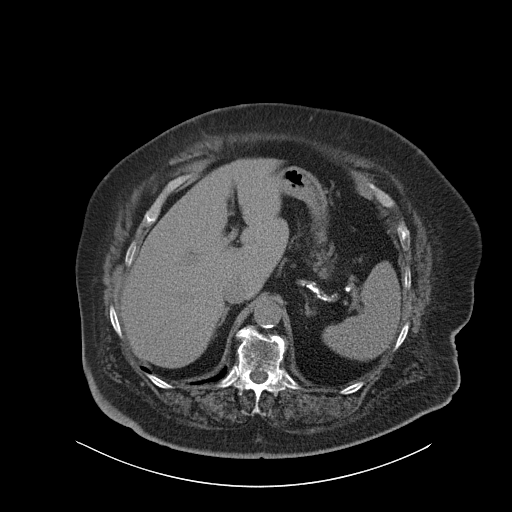
[im 78/88  soft-tissue]
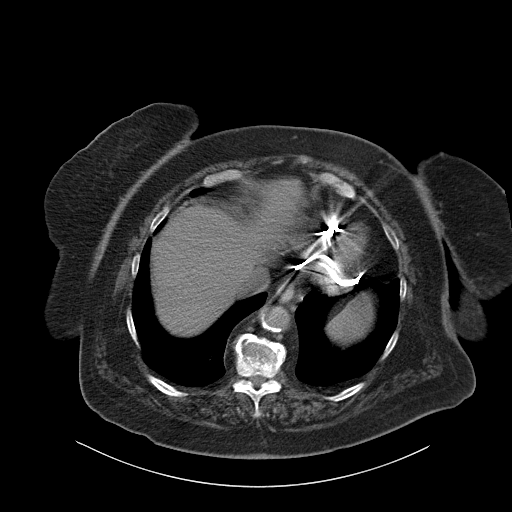
[im 83/88  soft-tissue]
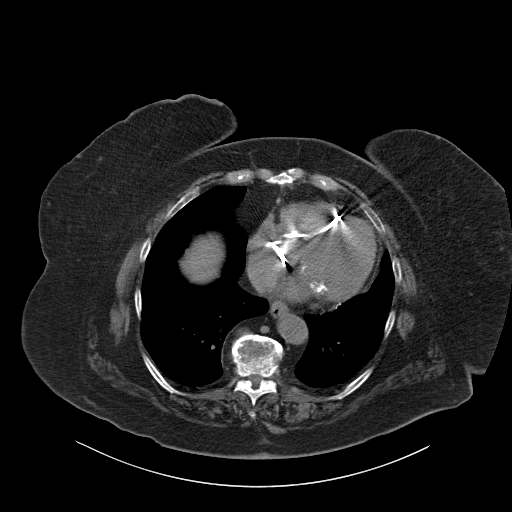

[Series 4: coronal st · coronal · 0.97mm/px · 3 of 135 slices shown]
[im 45/135  soft-tissue]
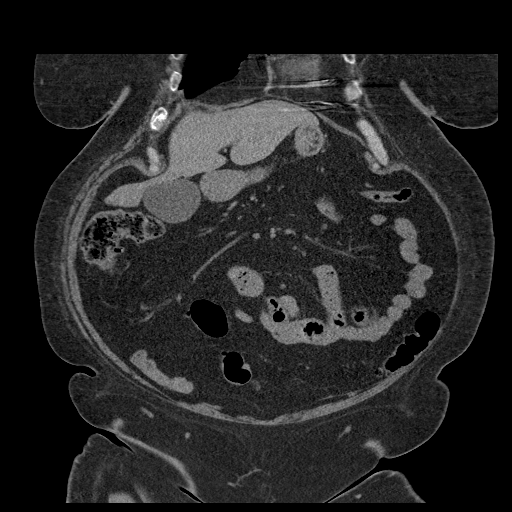
[im 60/135  soft-tissue]
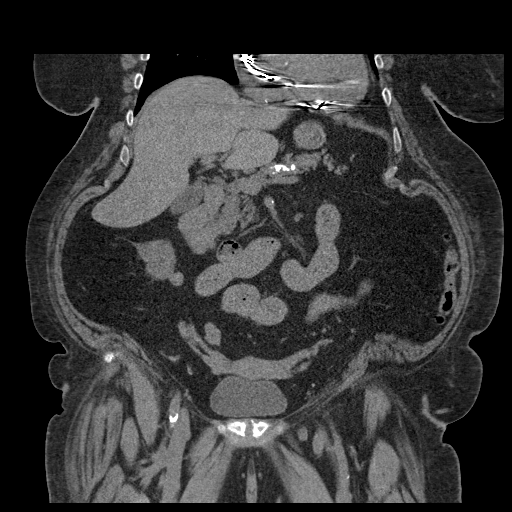
[im 75/135  soft-tissue]
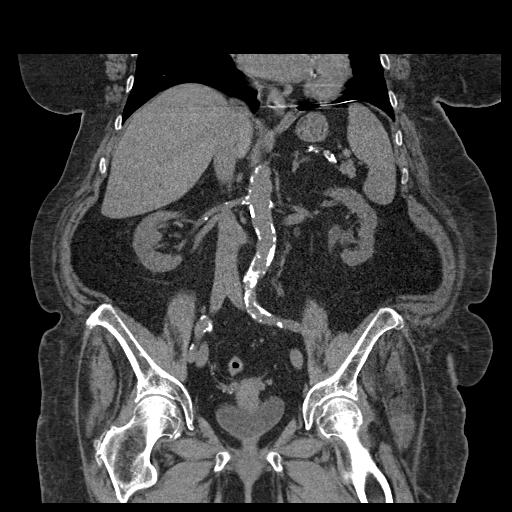

[17 of 46 positions shown; findings below may reference images not displayed]

FINDINGS: Lower chest: No acute abnormality. Atable-F mm pulmonary nodules in
the right lower lobe.

Hepatobiliary: No focal liver abnormality is seen. Unchanged small
layering gallstones. No gallbladder wall thickening or biliary
dilatation.

Pancreas: Unremarkable. No pancreatic ductal dilatation or
surrounding inflammatory changes.

Spleen: Normal in size without focal abnormality.

Adrenals/Urinary Tract: The adrenal glands are unremarkable.
Unchanged mild bilateral renal atrophy. Unchanged small bilateral
renal cysts. No renal calculi or hydronephrosis. The bladder is
unremarkable.

Stomach/Bowel: Stomach is within normal limits. Appendix not
visualized in this patient with prior history of appendectomy. No
evidence of bowel wall thickening, distention, or inflammatory
changes. Sigmoid diverticulosis.

Vascular/Lymphatic: Aortic atherosclerosis. No enlarged abdominal or
pelvic lymph nodes.

Reproductive: Uterus and bilateral adnexa are unremarkable.

Other: No abdominal wall hernia or abnormality. No abdominopelvic
ascites. No pneumoperitoneum.

Musculoskeletal: No acute or significant osseous findings. Stable
moderate to severe multilevel lumbar spondylosis and grade 1
anterolisthesis at L5-S1.
IMPRESSION: 1.  No acute intra-abdominal process.
2. Cholelithiasis.
3.  Aortic atherosclerosis ([id number]).

## 2018-11-17 LAB — CUP PACEART REMOTE DEVICE CHECK
Battery Remaining Longevity: 60 mo
Battery Remaining Percentage: 81 %
Brady Statistic RA Percent Paced: 0 %
Brady Statistic RV Percent Paced: 99 %
Date Time Interrogation Session: 20201006112800
Implantable Lead Implant Date: 20080923
Implantable Lead Implant Date: 20080923
Implantable Lead Implant Date: 20080923
Implantable Lead Location: 753858
Implantable Lead Location: 753859
Implantable Lead Location: 753860
Implantable Lead Model: 4285
Implantable Lead Model: 4555
Implantable Lead Model: 7120
Implantable Lead Serial Number: 170220
Implantable Lead Serial Number: 486468
Implantable Pulse Generator Implant Date: 20140923
Lead Channel Impedance Value: 1094 Ohm
Lead Channel Impedance Value: 645 Ohm
Lead Channel Impedance Value: 735 Ohm
Lead Channel Pacing Threshold Amplitude: 0.7 V
Lead Channel Pacing Threshold Amplitude: 1.2 V
Lead Channel Pacing Threshold Pulse Width: 0.4 ms
Lead Channel Pacing Threshold Pulse Width: 0.8 ms
Lead Channel Setting Pacing Amplitude: 1.9 V
Lead Channel Setting Pacing Amplitude: 2 V
Lead Channel Setting Pacing Pulse Width: 0.4 ms
Lead Channel Setting Pacing Pulse Width: 0.8 ms
Lead Channel Setting Sensing Sensitivity: 2.5 mV
Lead Channel Setting Sensing Sensitivity: 2.5 mV
Pulse Gen Serial Number: 100731

## 2018-11-18 ENCOUNTER — Ambulatory Visit: Payer: Medicare HMO | Admitting: Physical Therapy

## 2018-11-22 ENCOUNTER — Encounter: Payer: Medicare HMO | Admitting: Physical Therapy

## 2018-11-23 IMAGING — US US EXTREM LOW VENOUS*L*
1 series · 13 of 24 positions shown · non-contrast
Comparison: None.

CLINICAL DATA: Left lower extremity edema and pain.



[Series 1: us extrem low venous*left* · 0.08mm/px · 13 of 27 slices shown]
[im 1/27]
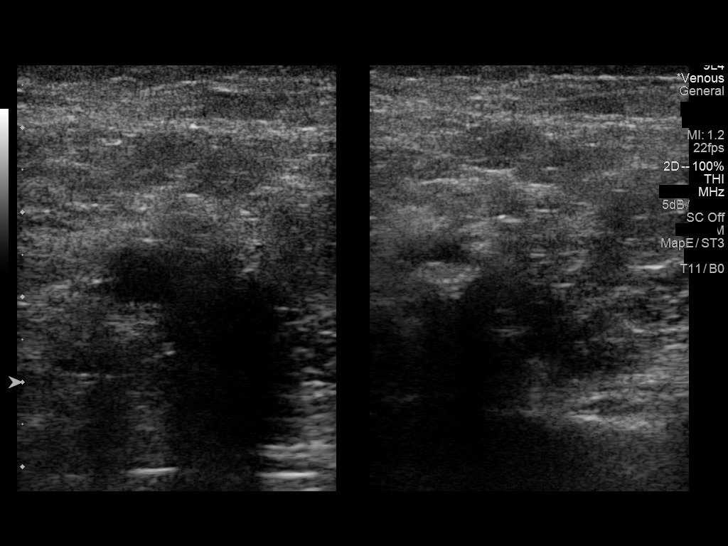
[im 3/27]
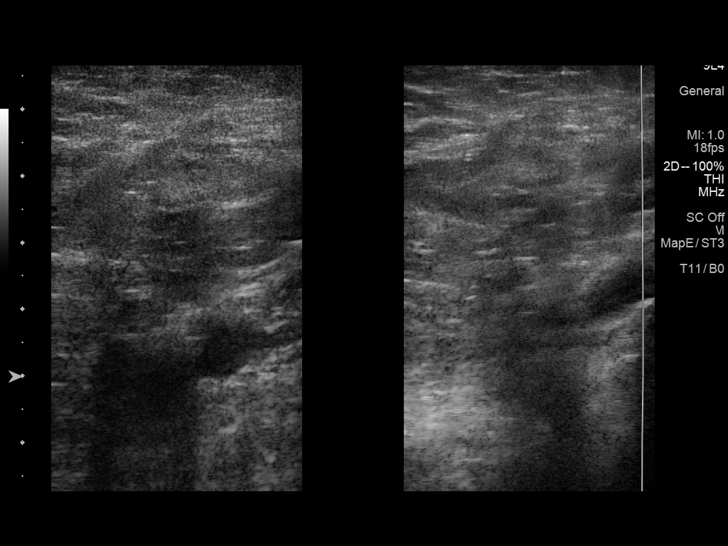
[im 5/27]
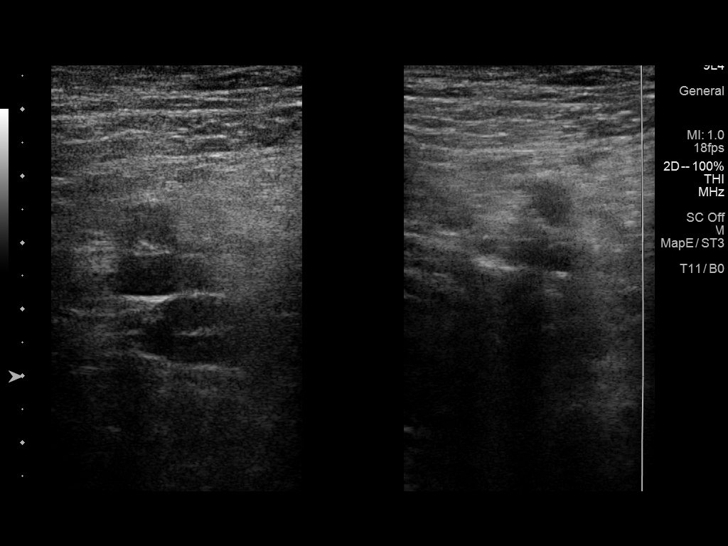
[im 7/27]
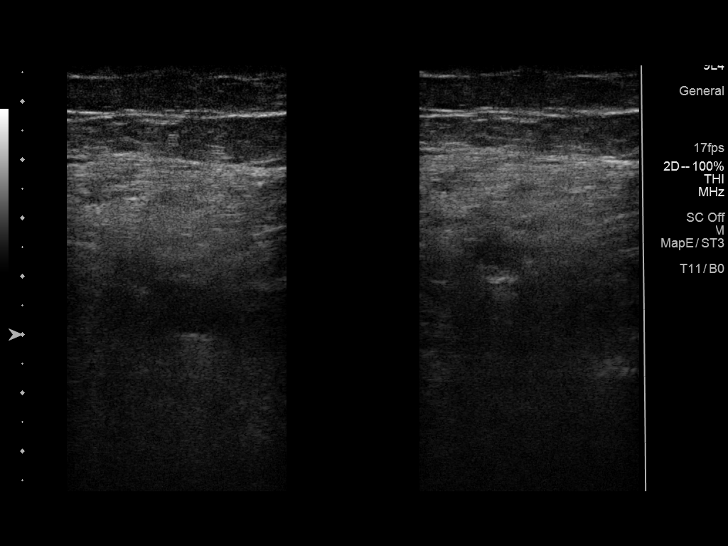
[im 10/27]
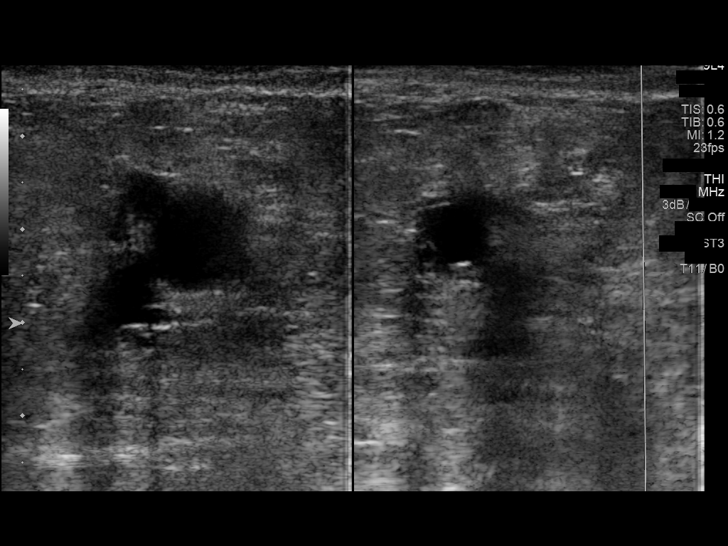
[im 12/27]
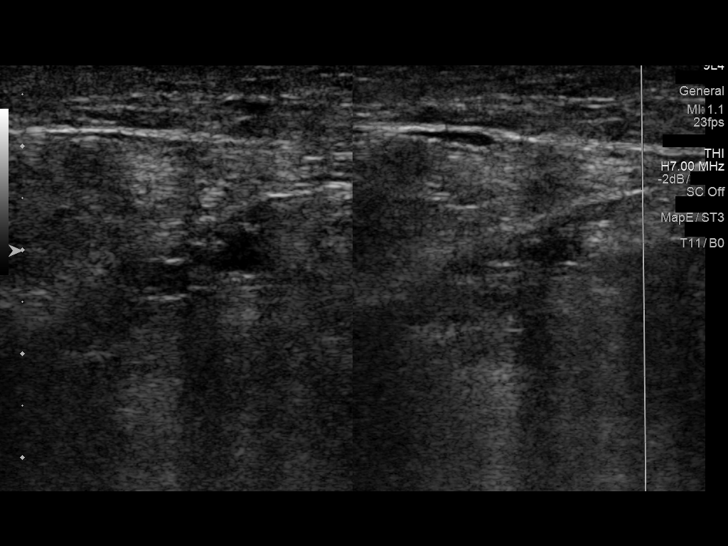
[im 14/27]
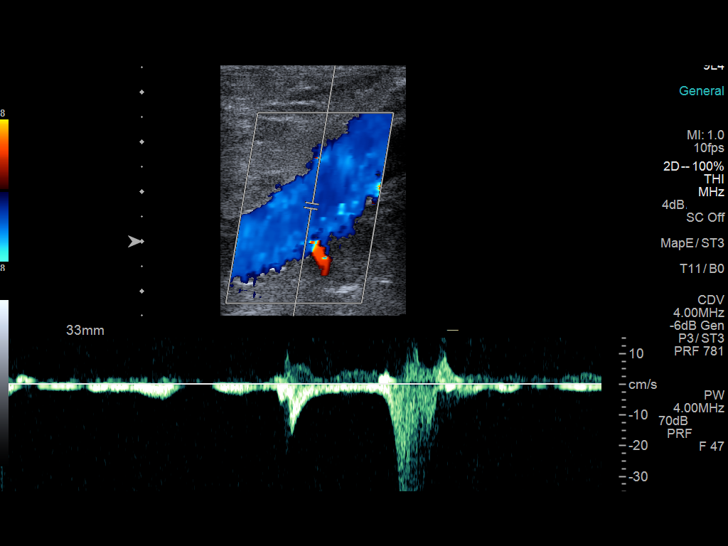
[im 15/27]
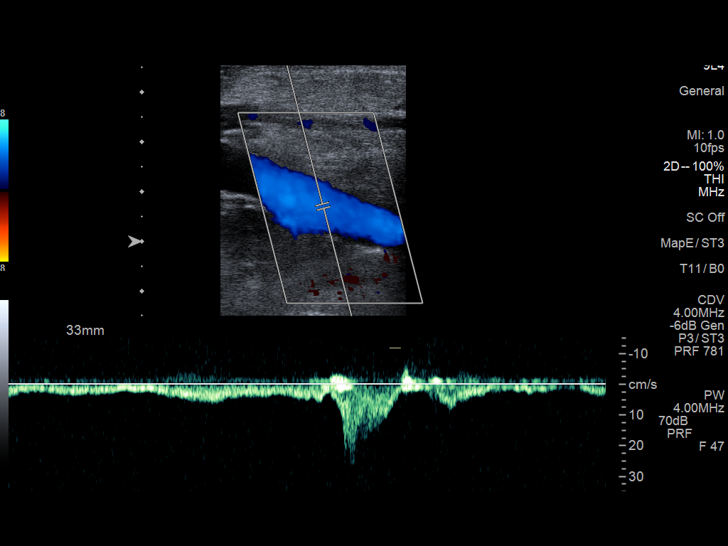
[im 17/27]
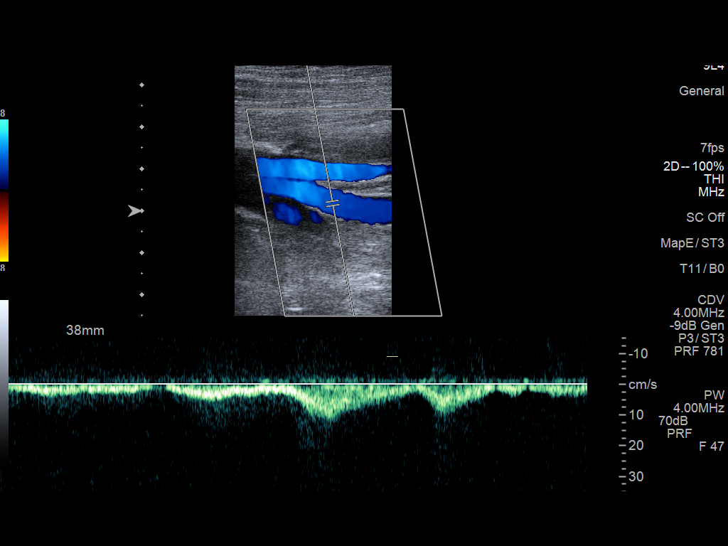
[im 20/27]
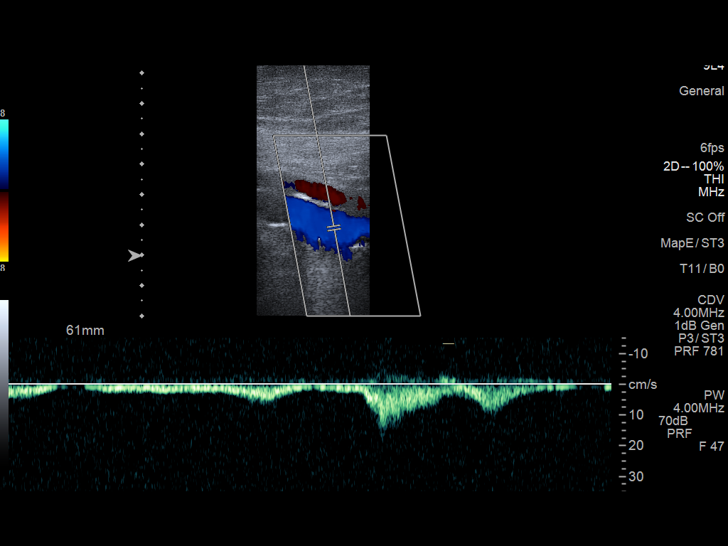
[im 22/27]
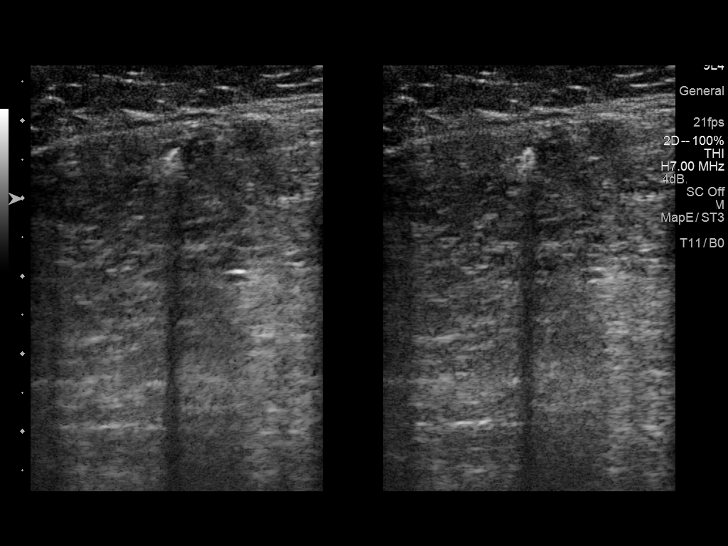
[im 24/27]
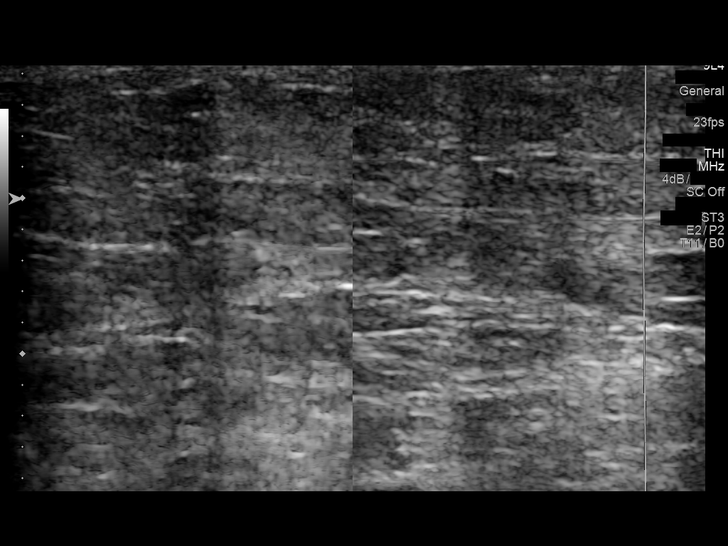
[im 27/27]
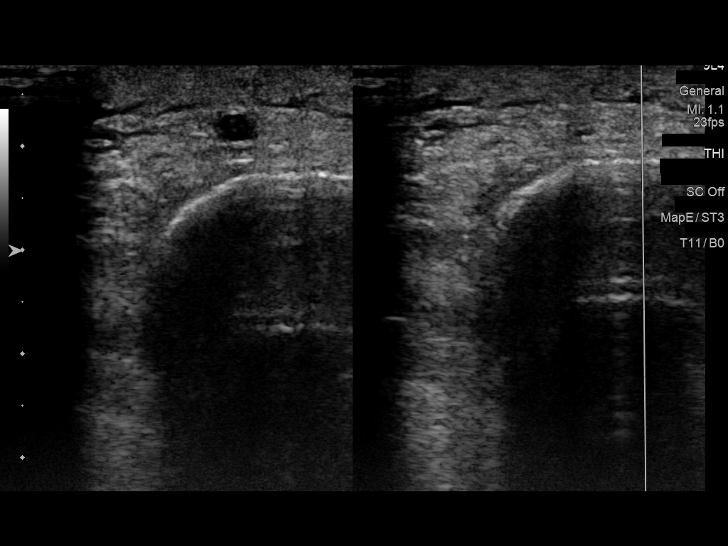

[13 of 24 positions shown; findings below may reference images not displayed]

FINDINGS: Contralateral Common Femoral Vein: Respiratory phasicity is normal
and symmetric with the symptomatic side. No evidence of thrombus.
Normal compressibility.

Common Femoral Vein: No evidence of thrombus. Normal
compressibility, respiratory phasicity and response to augmentation.

Saphenofemoral Junction: No evidence of thrombus. Normal
compressibility and flow on color Doppler imaging.

Profunda Femoral Vein: No evidence of thrombus. Normal
compressibility and flow on color Doppler imaging.

Femoral Vein: No evidence of thrombus. Normal compressibility,
respiratory phasicity and response to augmentation.

Popliteal Vein: No evidence of thrombus. Normal compressibility,
respiratory phasicity and response to augmentation.

Calf Veins: No evidence of thrombus. Normal compressibility and flow
on color Doppler imaging.

Superficial Great Saphenous Vein: Partially harvested. Visualized
segments show no evidence of thrombus and normal compressibility.

Venous Reflux:  None.

Other Findings: No evidence of superficial thrombophlebitis or
abnormal fluid collection.
IMPRESSION: No evidence of left lower extremity deep venous thrombosis.

## 2018-11-25 ENCOUNTER — Other Ambulatory Visit: Payer: Self-pay | Admitting: Cardiovascular Disease

## 2018-11-25 ENCOUNTER — Ambulatory Visit: Payer: Medicare HMO | Admitting: Physical Therapy

## 2018-11-25 DIAGNOSIS — N189 Chronic kidney disease, unspecified: Secondary | ICD-10-CM | POA: Diagnosis not present

## 2018-11-25 DIAGNOSIS — M109 Gout, unspecified: Secondary | ICD-10-CM | POA: Diagnosis not present

## 2018-11-25 DIAGNOSIS — I2510 Atherosclerotic heart disease of native coronary artery without angina pectoris: Secondary | ICD-10-CM | POA: Diagnosis not present

## 2018-11-25 DIAGNOSIS — E119 Type 2 diabetes mellitus without complications: Secondary | ICD-10-CM | POA: Diagnosis not present

## 2018-11-25 DIAGNOSIS — I10 Essential (primary) hypertension: Secondary | ICD-10-CM | POA: Diagnosis not present

## 2018-11-25 DIAGNOSIS — Z8679 Personal history of other diseases of the circulatory system: Secondary | ICD-10-CM | POA: Diagnosis not present

## 2018-11-25 DIAGNOSIS — M19072 Primary osteoarthritis, left ankle and foot: Secondary | ICD-10-CM | POA: Diagnosis not present

## 2018-11-25 NOTE — Progress Notes (Signed)
Remote pacemaker transmission.   

## 2018-11-26 ENCOUNTER — Other Ambulatory Visit: Payer: Self-pay

## 2018-11-26 ENCOUNTER — Ambulatory Visit: Payer: Medicare HMO | Attending: Family Medicine | Admitting: Physical Therapy

## 2018-11-26 ENCOUNTER — Encounter: Payer: Self-pay | Admitting: Physical Therapy

## 2018-11-26 DIAGNOSIS — R262 Difficulty in walking, not elsewhere classified: Secondary | ICD-10-CM | POA: Insufficient documentation

## 2018-11-26 DIAGNOSIS — R296 Repeated falls: Secondary | ICD-10-CM | POA: Diagnosis not present

## 2018-11-26 DIAGNOSIS — M6281 Muscle weakness (generalized): Secondary | ICD-10-CM | POA: Insufficient documentation

## 2018-11-26 NOTE — Therapy (Signed)
[facility] [address] [place], [place], [address] Phone: [phone]   Fax:  [phone]  Physical Therapy Treatment  Patient Details  Name: [name] MRN: [id number] Date of Birth: [birth year] Referring Provider (PT): Dr. Dorthy Cooler   Encounter Date: 11/26/2018  PT End of Session - 11/26/18 1016    Visit Number  4    Date for PT Re-Evaluation  12/23/18    Authorization Type  [employer]    PT Start Time  1016    PT Stop Time  1054    PT Time Calculation (min)  38 min    Activity Tolerance  Patient tolerated treatment well;Patient limited by fatigue    Behavior During Therapy  [facility] for tasks assessed/performed       Past Medical History:  Diagnosis Date  . AICD (automatic cardioverter/defibrillator) present   . Anemia   . Arthritis   . Asthma   . CAD (coronary artery disease)    a. 10/09/16 LHC: SVG->LAD patent, SVG->Diag patent, SVG->RCA patent, SVG->LCx occluded. EF 60%, b. 10/31/16 LHC DES to AV groove Circ, DES to intermed branch  . Cellulitis and abscess of foot 12/2014   RT FOOT  . CHF (congestive heart failure) ([place])   . Chronic bronchitis ([place])   . Chronic lower back pain   . Diverticulosis   . Facial numbness 02/2016  . Fatty liver disease, nonalcoholic   . Gastritis   . GERD (gastroesophageal reflux disease)   . Gout   . H/O hiatal hernia   . High cholesterol   . Hyperplastic colon polyp   . Hypertension   . IBS (irritable bowel syndrome)   . Internal hemorrhoids   . Ischemic cardiomyopathy   . Obesity   . On home oxygen therapy    "2L at night" (10/31/2016)  . OSA (obstructive sleep apnea)    "can't tolerate a mask" (10/30/2016)  . Pneumonia    "couple times" (10/31/2016)  . Shortness of breath   . TIA (transient ischemic attack)    "recently" (10/31/2016)  . Type II diabetes mellitus ([place])     Past Surgical History:  Procedure Laterality Date  . ABDOMINAL ULTRASOUND  12/01/2011    Peripelvic cysts- #1- 2.4x1.9x2.3cm, #2-1.2x0.9x1.2cm  . ANKLE FRACTURE SURGERY Right    "had rod put in"  . ANTERIOR CERVICAL DECOMP/DISCECTOMY FUSION    . APPENDECTOMY    . BACK SURGERY    . BIOPSY  12/29/2017   Procedure: BIOPSY;  Surgeon: [name], MD;  [facility]: [facility];  Service: Endoscopy;;  . BIV ICD INSERTION CRT-D  2001?  [name] BIV PACEMAKER GENERATOR CHANGE OUT N/A 11/02/2012   Procedure: BIV PACEMAKER GENERATOR CHANGE OUT;  Surgeon: Sanda Klein, MD;  Location: [place] CATH LAB;  Service: Cardiovascular;  Laterality: N/A;  . CARDIAC CATHETERIZATION  05/17/1999   No significant coronary obstructive disease w/ mild 20% luminal irregularity of the first diag branch of the LAD  . CARDIAC CATHETERIZATION  07/08/2002   No significant CAD, moderately depressed LV systolic function  . CARDIAC CATHETERIZATION Bilateral 04/26/2007   Normal findings, recommend medical therapy  . CARDIAC CATHETERIZATION  02/18/2008   Moderate CAD, would benefit from having a functional study, recommend continue medical therapy  . CARDIAC CATHETERIZATION  07/23/2012   Medical therapy  . CARDIAC CATHETERIZATION N/A 11/24/2014   Procedure: Left Heart Cath and Coronary Angiography;  Surgeon: Troy Sine, MD;  Location: [place] CV LAB;  Service: Cardiovascular;  Laterality:  N/A;  . CARDIAC CATHETERIZATION  11/27/2014   Procedure: Intravascular Pressure Wire/FFR Study;  Surgeon: Peter M Martinique, MD;  Location: [place] CV LAB;  Service: Cardiovascular;;  . CARDIAC CATHETERIZATION  10/09/2016  . CHOLECYSTECTOMY N/A 04/09/2018   Procedure: LAPAROSCOPIC CHOLECYSTECTOMY;  Surgeon: Greer Pickerel, MD;  [facility];  Service: General;  Laterality: N/A;  . COLONOSCOPY WITH PROPOFOL N/A 12/29/2017   Procedure: COLONOSCOPY WITH PROPOFOL;  Surgeon: [name], MD;  [facility];  Service: Endoscopy;  Laterality: N/A;  . CORONARY ANGIOGRAM  2010  . CORONARY ARTERY BYPASS GRAFT N/A  11/29/2014   Procedure: CORONARY ARTERY BYPASS GRAFTING x 5 (LIMA-LAD, SVG-D, SVG-OM1-OM2, SVG-PD);  Surgeon: Melrose Nakayama, MD;  Location: [place];  Service: Open Heart Surgery;  Laterality: N/A;  . CORONARY STENT INTERVENTION N/A 10/31/2016   Procedure: CORONARY STENT INTERVENTION;  Surgeon: Burnell Blanks, MD;  Location: [place] CV LAB;  Service: Cardiovascular;  Laterality: N/A;  . ESOPHAGOGASTRODUODENOSCOPY (EGD) WITH PROPOFOL N/A 12/29/2017   Procedure: ESOPHAGOGASTRODUODENOSCOPY (EGD) WITH PROPOFOL;  Surgeon: [name], MD;  [facility];  Service: Endoscopy;  Laterality: N/A;  . FRACTURE SURGERY    . INSERT / REPLACE / [place]  . KNEE ARTHROSCOPY Bilateral   . LEFT HEART CATH AND CORS/GRAFTS ANGIOGRAPHY N/A 12/09/2017   Procedure: LEFT HEART CATH AND CORS/GRAFTS ANGIOGRAPHY;  Surgeon: Troy Sine, MD;  Location: [place] CV LAB;  Service: Cardiovascular;  Laterality: N/A;  . LEFT HEART CATHETERIZATION WITH CORONARY ANGIOGRAM N/A 07/23/2012   Procedure: LEFT HEART CATHETERIZATION WITH CORONARY ANGIOGRAM;  Surgeon: Leonie Man, MD;  Location: [facility] CATH LAB;  Service: Cardiovascular;  Laterality: N/A;  Carlton Adam MYOVIEW  11/14/2011   Mild-moderate defect seen in Mid Inferolateral and Mid Anterolateral regions-consistant w/ infarct/scar. No significant ischemia demonstrated.  [name] POLYPECTOMY  12/29/2017   Procedure: POLYPECTOMY;  Surgeon: [name], MD;  [facility];  Service: Endoscopy;;  . RIGHT/LEFT HEART CATH AND CORONARY ANGIOGRAPHY N/A 10/09/2016   Procedure: RIGHT/LEFT HEART CATH AND CORONARY ANGIOGRAPHY;  Surgeon: Jolaine Artist, MD;  Location: [place] CV LAB;  Service: Cardiovascular;  Laterality: N/A;  . TEE WITHOUT CARDIOVERSION N/A 11/29/2014   Procedure: TRANSESOPHAGEAL ECHOCARDIOGRAM (TEE);  Surgeon: Melrose Nakayama, MD;  Location: [place];  Service: Open Heart Surgery;  Laterality: N/A;   . TRANSTHORACIC ECHOCARDIOGRAM  07/23/2012   EF 55-60%, normal-mild  . TUBAL LIGATION      There were no vitals filed for this visit.  Subjective Assessment - 11/26/18 1017    Subjective  Pt arrives with O2 sat level 83 without oxygen, with oxygen went up to  96.  I am walking a lot better and the exercises make my legs sore.  Leg swelling and burning is improving with use of compression socks and the exercises.  Exericses make my leg burning go away.    Pertinent History  3L O2 dependent;  chronic LBP; diabetes;  CABG x5;  left ankle fracture; pacemaker    Limitations  House hold activities;Walking;Standing    How long can you stand comfortably?  < 20 minutes to cook supper    How long can you walk comfortably?  living room to bathroom    Diagnostic tests  no    Patient Stated Goals  be able to walk and not be afraid to fall;  go to church by myself    Currently in Pain?  Yes    Pain  Score  4    soreness in legs   Pain Descriptors / Indicators  Sore    Pain Type  Chronic pain    Pain Radiating Towards  both legs    Pain Onset  More than a month ago    Pain Frequency  Constant    Aggravating Factors   walking, standing    Pain Relieving Factors  sitting                       [facility] Adult PT Treatment/Exercise - 11/26/18 0001      Exercises   Exercises  Lumbar;Knee/Hip;Ankle      Lumbar Exercises: Aerobic   Nustep  seat 8 L1 60 sec on/30 sec rest x 2'   Pt needed to stop after 2 rounds     Knee/Hip Exercises: Seated   Long Arc Quad  Both;AROM;1 set;10 reps;Strengthening;Other (comment)   with 3 ankle pumps each   Long Arc Quad Limitations  1x10 with yellow band around ankles    Marching  Strengthening;20 reps;Both    Marching Limitations  yellow band around knees    Abduction/Adduction   Strengthening;20 reps;2 sets;10 reps   one set of 20, one set of 10 due to fatigue   Abd/Adduction Limitations  yellow clamshell around knees    Sit to Sand  10  reps;without UE support      Ankle Exercises: Seated   Heel Raises  20 reps;Both    Toe Raise  20 reps    Toe Raise Limitations  both             PT Education - 11/26/18 1056    Education Details  Access Code: [email or web address]    Person(s) Educated  Patient    Methods  Explanation;Demonstration;Handout    Comprehension  Verbalized understanding;Returned demonstration       PT Short Term Goals - 10/28/18 2008      PT SHORT TERM GOAL #1   Title  The patient will demonstrate compliance with initial HEP for strengthening of core and LE    Time  4    Period  Weeks    Status  New    Target Date  11/25/18      PT SHORT TERM GOAL #2   Title  The patient will be able to ambulate 160 feet with RW with supervision    Time  4    Period  Weeks    Status  New      PT SHORT TERM GOAL #3   Title  Improved BERG balance test to 43/56 indicating improved safety with home and communtiy  mobility    Time  4    Period  Weeks      PT SHORT TERM GOAL #4   Title  Activities Based Confidence Scale score improved to 54%    Time  4    Period  Weeks    Status  New        PT Long Term Goals - 10/28/18 2014      PT LONG TERM GOAL #1   Title  The patient will be independent with safe self progression of HEP    Time  8    Period  Weeks    Status  New    Target Date  12/23/18      PT LONG TERM GOAL #2   Title  The patient will be able to ambulate 250 feet needed to attend church  Time  8    Period  Weeks    Status  New      PT LONG TERM GOAL #3   Title  Improved LE strength to grossly 4/5 to 4+/5 as indicated by increased 5x sit to stand speed to 15 sec    Time  8    Period  Weeks    Status  New      PT LONG TERM GOAL #4   Title  BERG balance score improved to 46/56 indicating improved gait safety and decreased risk of falls    Time  8    Period  Weeks    Status  New      PT LONG TERM GOAL #5   Title  Activities Based Confidence Scale improved to 59%    Time  8    Period   Weeks    Status  New            Plan - 11/26/18 1056    Clinical Impression Statement  Pt with report of improving pain and swelling in LEs with the exercises.  O2 sat was good with 3L today (96-98%).  She said she was up to doing 50 LAQs so PT progressed to using yellow band and added band clam and march in sitting.  Pt was fatigued for standing ther ex and needed to stop on NuStep (end of session) after 2 rounds of 1 min each with 30 sec rest between.  PT disucssed propper donning insructions for compression stockings (do not fold over at top to avoid tourniquet effect).  PT couldn't find code for HEP in chart so started new one (code below.).  Pt will continue to benefit from careful monitoring of fatigue and O2 sat levels with progression of strength and endurance.    Comorbidities  cardiac history with bypass surgery 2 years ago, diabetes, HTN, cellulitis, kidney failure, O2 dependent; obesity, asthma; pacemaker    Examination-Participation Restrictions  Church;Meal Prep;Cleaning;Community Activity;Shop    Rehab Potential  Good    PT Frequency  2x / week    PT Duration  8 weeks    PT Treatment/Interventions  ADLs/Self Care Home Management;Gait training;DME Instruction;Functional mobility training;Stair training;Therapeutic activities;Therapeutic exercise;Neuromuscular re-education;Manual techniques;Patient/family education    PT Next Visit Plan  sitting ex for core and LE strengthening;   f/u on use of yellow band with HEP, low level standing balance in short bouts secondary to pain with prolonged standing;  Nu-Step;  periodic BP and O2 saturation checks    PT Home Exercise Plan  Access Code: [email or web address]       Patient will benefit from skilled therapeutic intervention in order to improve the following deficits and impairments:     Visit Diagnosis: Muscle weakness (generalized)  Difficulty in walking, not elsewhere classified  Repeated falls     Problem List Patient Active  Problem List   Diagnosis Date Noted  . Constipation 10/21/2018  . Leukopenia 05/03/2018  . Thrombocytopenia ([place]) 05/03/2018  . Pneumonia due to human metapneumovirus 05/02/2018  . Chest pain 05/01/2018  . Allergic rhinitis 04/13/2018  . S/P laparoscopic cholecystectomy 04/09/2018  . Chronic respiratory failure with hypoxia and hypercapnia ([place]) 03/07/2018  . Polyp of cecum   . Polyp of transverse colon   . Positive colorectal cancer screening using Cologuard test   . Dysphagia   . Acute on chronic renal failure ([place]) 11/20/2017  . CAD (coronary artery disease) 11/20/2017  . DM2 (diabetes mellitus, type 2) ([place]) 11/20/2017  .  Abdominal pain, epigastric 11/20/2017  . Vasomotor rhinitis 10/16/2016  . Right facial numbness 03/05/2016  . CKD (chronic kidney disease), stage III 03/05/2016  . Atrial fibrillation ([place]) 10/24/2015  . Chest pain with moderate risk of acute coronary syndrome 10/23/2015  . History of TIA (transient ischemic attack) 10/22/2015  . Complete heart block ([place]) 06/22/2015  . Allergic drug rash due to anti-infective agent 04/29/2015  . Fatigue 02/14/2015  . Chronic diastolic CHF (congestive heart failure) ([place]) 02/14/2015  . Cellulitis 12/21/2014  . S/P CABG x 5 11/29/2014  . CAD S/P percutaneous coronary angioplasty   . Diarrhea 07/12/2014  . Generalized abdominal pain 07/12/2014  . Diastolic CHF, acute on chronic (HCC) 11/04/2013  . Mixed hypercholesterolemia and hypertriglyceridemia 04/22/2013  . Vertigo 04/22/2013  . Dyspnea on exertion 08/25/2012  . Allergic to IV contrast 07/23/2012  . Unstable angina ([place]) 07/22/2012  . Morbid (severe) obesity due to excess calories ([place]) 11/22/2011  . Nonischemic cardiomyopathy ([place]) 11/22/2011  . Gastroesophageal reflux disease 11/22/2011  . Back pain 11/22/2011  . OSA (obstructive sleep apnea) 11/22/2011  . HEMORRHOIDS-EXTERNAL 02/21/2010  . NAUSEA 02/21/2010  . ABDOMINAL PAIN -GENERALIZED 02/21/2010  .  PERSONAL HX COLONIC POLYPS 02/21/2010  . ANEMIA 04/16/2007  . Essential hypertension 04/16/2007  . DIVERTICULOSIS, COLON 04/16/2007  . ARTHRITIS 04/16/2007  . INTERNAL HEMORRHOIDS WITHOUT MENTION COMP 04/12/2007  . Asthma 04/12/2007  . Cardiac resynchronization therapy pacemaker (CRT-P) in place 04/12/2007  . Gastritis without bleeding 12/14/2006  . COLONIC POLYPS, HYPERPLASTIC 09/27/2002  . FATTY LIVER DISEASE 02/16/2002    Baruch Merl, PT 11/26/18 11:00 AM   [facility] [address] [place], [place], [address] Phone: [phone]   Fax:  [phone]  Name: [name] MRN: [id number] Date of Birth: February 26, 1945

## 2018-11-26 NOTE — Patient Instructions (Signed)
Access Code: [email or web address]  URL: [email or web address]  Date: 11/26/2018  Prepared by: Venetia Night Deepa Barthel   Exercises  Seated Long Arc Quad - 10 reps - 3 sets - 1x daily - 7x weekly  Seated Hip Abduction with Resistance - 10 reps - 3 sets - 1x daily - 7x weekly  Seated March with Resistance - 20 reps - 3 sets - 1x daily - 7x weekly  Seated Heel Toe Raises - 20 reps - 3 sets - 1x daily - 7x weekly  Sit to Stand - 5 reps - 3 sets - 1x daily - 7x weekly

## 2018-11-29 ENCOUNTER — Ambulatory Visit: Payer: Medicare HMO | Admitting: Physical Therapy

## 2018-11-29 NOTE — Progress Notes (Signed)
Reviewed and agree with documentation and assessment and plan. K. Veena Dhrithi Riche , MD   

## 2018-12-02 ENCOUNTER — Other Ambulatory Visit: Payer: Self-pay

## 2018-12-02 ENCOUNTER — Ambulatory Visit: Payer: Medicare HMO | Admitting: Physical Therapy

## 2018-12-02 DIAGNOSIS — M6281 Muscle weakness (generalized): Secondary | ICD-10-CM

## 2018-12-02 DIAGNOSIS — R262 Difficulty in walking, not elsewhere classified: Secondary | ICD-10-CM | POA: Diagnosis not present

## 2018-12-02 DIAGNOSIS — R6889 Other general symptoms and signs: Secondary | ICD-10-CM | POA: Diagnosis not present

## 2018-12-02 DIAGNOSIS — R296 Repeated falls: Secondary | ICD-10-CM

## 2018-12-02 NOTE — Therapy (Signed)
[facility] [address] [place], [place], [address] Phone: [phone]   Fax:  [phone]  Physical Therapy Treatment  Patient Details  Name: [name] MRN: [id number] Date of Birth: 1945-11-08 Referring Provider (PT): Dr. Dorthy Cooler   Encounter Date: 12/02/2018  PT End of Session - 12/02/18 1426    Visit Number  5    Date for PT Re-Evaluation  12/23/18    Authorization Type  [employer]    PT Start Time  8768    PT Stop Time  1435    PT Time Calculation (min)  38 min    Activity Tolerance  Patient tolerated treatment well;Patient limited by fatigue       Past Medical History:  Diagnosis Date  . AICD (automatic cardioverter/defibrillator) present   . Anemia   . Arthritis   . Asthma   . CAD (coronary artery disease)    a. 10/09/16 LHC: SVG->LAD patent, SVG->Diag patent, SVG->RCA patent, SVG->LCx occluded. EF 60%, b. 10/31/16 LHC DES to AV groove Circ, DES to intermed branch  . Cellulitis and abscess of foot 12/2014   RT FOOT  . CHF (congestive heart failure) ([place])   . Chronic bronchitis ([place])   . Chronic lower back pain   . Diverticulosis   . Facial numbness 02/2016  . Fatty liver disease, nonalcoholic   . Gastritis   . GERD (gastroesophageal reflux disease)   . Gout   . H/O hiatal hernia   . High cholesterol   . Hyperplastic colon polyp   . Hypertension   . IBS (irritable bowel syndrome)   . Internal hemorrhoids   . Ischemic cardiomyopathy   . Obesity   . On home oxygen therapy    "2L at night" (10/31/2016)  . OSA (obstructive sleep apnea)    "can't tolerate a mask" (10/30/2016)  . Pneumonia    "couple times" (10/31/2016)  . Shortness of breath   . TIA (transient ischemic attack)    "recently" (10/31/2016)  . Type II diabetes mellitus ([place])     Past Surgical History:  Procedure Laterality Date  . ABDOMINAL ULTRASOUND  12/01/2011   Peripelvic cysts- #1- 2.4x1.9x2.3cm, #2-1.2x0.9x1.2cm  .  ANKLE FRACTURE SURGERY Right    "had rod put in"  . ANTERIOR CERVICAL DECOMP/DISCECTOMY FUSION    . APPENDECTOMY    . BACK SURGERY    . BIOPSY  12/29/2017   Procedure: BIOPSY;  Surgeon: [name], MD;  Location: [facility];  Service: Endoscopy;;  . BIV ICD INSERTION CRT-D  2001?  [name] BIV PACEMAKER GENERATOR CHANGE OUT N/A 11/02/2012   Procedure: BIV PACEMAKER GENERATOR CHANGE OUT;  Surgeon: Sanda Klein, MD;  Location: [facility] CATH [facility];  Service: Cardiovascular;  Laterality: N/A;  . CARDIAC CATHETERIZATION  05/17/1999   No significant coronary obstructive disease w/ mild 20% luminal irregularity of the first diag branch of the LAD  . CARDIAC CATHETERIZATION  07/08/2002   No significant CAD, moderately depressed LV systolic function  . CARDIAC CATHETERIZATION Bilateral 04/26/2007   Normal findings, recommend medical therapy  . CARDIAC CATHETERIZATION  02/18/2008   Moderate CAD, would benefit from having a functional study, recommend continue medical therapy  . CARDIAC CATHETERIZATION  07/23/2012   Medical therapy  . CARDIAC CATHETERIZATION N/A 11/24/2014   Procedure: Left Heart Cath and Coronary Angiography;  Surgeon: Troy Sine, MD;  Location: [facility];  Service: Cardiovascular;  Laterality: N/A;  . CARDIAC CATHETERIZATION  11/27/2014   Procedure: Intravascular  Pressure Wire/FFR Study;  Surgeon: Peter M Martinique, MD;  Location: [place] [facility];  Service: Cardiovascular;;  . CARDIAC CATHETERIZATION  10/09/2016  . CHOLECYSTECTOMY N/A 04/09/2018   Procedure: LAPAROSCOPIC CHOLECYSTECTOMY;  Surgeon: Greer Pickerel, MD;  Location: [facility];  Service: General;  Laterality: N/A;  . COLONOSCOPY WITH PROPOFOL N/A 12/29/2017   Procedure: COLONOSCOPY WITH PROPOFOL;  Surgeon: [name], MD;  Location: [facility];  Service: Endoscopy;  Laterality: N/A;  . CORONARY ANGIOGRAM  2010  . CORONARY ARTERY BYPASS GRAFT N/A 11/29/2014   Procedure: CORONARY ARTERY BYPASS GRAFTING x 5  (LIMA-LAD, SVG-D, SVG-OM1-OM2, SVG-PD);  Surgeon: Melrose Nakayama, MD;  Location: [facility];  Service: Open Heart Surgery;  Laterality: N/A;  . CORONARY STENT INTERVENTION N/A 10/31/2016   Procedure: CORONARY STENT INTERVENTION;  Surgeon: Burnell Blanks, MD;  Location: [facility];  Service: Cardiovascular;  Laterality: N/A;  . ESOPHAGOGASTRODUODENOSCOPY (EGD) WITH PROPOFOL N/A 12/29/2017   Procedure: ESOPHAGOGASTRODUODENOSCOPY (EGD) WITH PROPOFOL;  Surgeon: [name], MD;  [facility]: [facility];  Service: Endoscopy;  Laterality: N/A;  . FRACTURE SURGERY    . INSERT / REPLACE / [place]  . KNEE ARTHROSCOPY Bilateral   . LEFT HEART CATH AND CORS/GRAFTS ANGIOGRAPHY N/A 12/09/2017   Procedure: LEFT HEART CATH AND CORS/GRAFTS ANGIOGRAPHY;  Surgeon: Troy Sine, MD;  Location: [place] [facility];  Service: Cardiovascular;  Laterality: N/A;  . LEFT HEART CATHETERIZATION WITH CORONARY ANGIOGRAM N/A 07/23/2012   Procedure: LEFT HEART CATHETERIZATION WITH CORONARY ANGIOGRAM;  Surgeon: Leonie Man, MD;  Location: [facility];  Service: Cardiovascular;  Laterality: N/A;  Carlton Adam MYOVIEW  11/14/2011   Mild-moderate defect seen in Mid Inferolateral and Mid Anterolateral regions-consistant w/ infarct/scar. No significant ischemia demonstrated.  [name] POLYPECTOMY  12/29/2017   Procedure: POLYPECTOMY;  Surgeon: [name], MD;  [facility]: [facility];  Service: Endoscopy;;  . RIGHT/LEFT HEART CATH AND CORONARY ANGIOGRAPHY N/A 10/09/2016   Procedure: RIGHT/LEFT HEART CATH AND CORONARY ANGIOGRAPHY;  Surgeon: Jolaine Artist, MD;  Location: [place] [facility];  Service: Cardiovascular;  Laterality: N/A;  . TEE WITHOUT CARDIOVERSION N/A 11/29/2014   Procedure: TRANSESOPHAGEAL ECHOCARDIOGRAM (TEE);  Surgeon: Melrose Nakayama, MD;  Location: [place] [facility];  Service: Open Heart Surgery;  Laterality: N/A;  . TRANSTHORACIC ECHOCARDIOGRAM  07/23/2012   EF 55-60%,  normal-mild  . TUBAL LIGATION      There were no vitals filed for this visit.  Subjective Assessment - 12/02/18 1356    Subjective  I'm walking better.  Going to church once a week.  I'm doing a lot more than I was.  I have to use the motorized cart at the grocery store.  Standing and walking bothers my back.  The other day I cleaned the refrigerator and made the bad.  I pushed through.    Pertinent History  3L O2 dependent;  chronic LBP; diabetes;  CABG x5;  left ankle fracture; pacemaker    Currently in Pain?  Yes    Pain Score  4     Pain Location  Back    Pain Orientation  Right;Left;Lower         [facility] PT Assessment - 12/02/18 0001      Observation/Other Assessments   Activities of Balance Confidence Scale (ABC Scale)   45.62      6 minute walk test results    Aerobic Endurance Distance Walked  120   85% O2 sat   Endurance  additional comments  95% O2 pre walk       Berg Balance Test   Sit to Stand  Able to stand without using hands and stabilize independently    Standing Unsupported  Able to stand safely 2 minutes    Sitting with Back Unsupported but Feet Supported on Floor or Stool  Able to sit safely and securely 2 minutes    Stand to Sit  Sits safely with minimal use of hands    Transfers  Able to transfer safely, minor use of hands    Standing Unsupported with Eyes Closed  Able to stand 10 seconds safely    Standing Unsupported with Feet Together  Able to place feet together independently and stand 1 minute safely    From Standing, Reach Forward with Outstretched Arm  Can reach forward >5 cm safely (2")    From Standing Position, Pick up Object from [place] to pick up shoe safely and easily    From Standing Position, Turn to Look Behind Over each Shoulder  Looks behind from both sides and weight shifts well    Turn 360 Degrees  Able to turn 360 degrees safely but slowly    Standing Unsupported, Alternately Place Feet on Step/Stool  Able to complete >2 steps/needs  minimal assist    Standing Unsupported, One Foot in Front  Able to take small step independently and hold 30 seconds    Standing on One Leg  Unable to try or needs assist to prevent fall    Total Score  43                   [facility] Adult PT Treatment/Exercise - 12/02/18 0001      Therapeutic Activites    Therapeutic Activities  ADL's    ADL's  walking, standing, sit to stand      Lumbar Exercises: Aerobic   Nustep  seat 8 3 minutes L1 with 2 rest breaks      Knee/Hip Exercises: Seated   Long Arc Quad  Strengthening;Right;Left;10 reps    Long Arc Quad Limitations  red band     Clamshell with TheraBand  Red   20x    Knee/Hip Flexion  red band 10x right/left     Sit to [employer]  5 reps               PT Short Term Goals - 12/02/18 1400      PT SHORT TERM GOAL #1   Status  Achieved      PT SHORT TERM GOAL #2   Title  The patient will be able to ambulate 160 feet with RW with supervision    Baseline  120 feet    Status  Partially Met      PT SHORT TERM GOAL #3   Title  Improved BERG balance test to 43/56 indicating improved safety with home and communtiy  mobility    Time  4    Period  Weeks    Status  Achieved      PT SHORT TERM GOAL #4   Title  Activities Based Confidence Scale score improved to 54%    Status  On-going        PT Long Term Goals - 10/28/18 2014      PT LONG TERM GOAL #1   Title  The patient will be independent with safe self progression of HEP    Time  8    Period  Weeks  Status  New    [place] Date  12/23/18      PT LONG TERM GOAL #2   Title  The patient will be able to ambulate 250 feet needed to attend church    Time  8    Period  Weeks    Status  New      PT LONG TERM GOAL #3   Title  Improved LE strength to grossly 4/5 to 4+/5 as indicated by increased 5x sit to stand speed to 15 sec    Time  8    Period  Weeks    Status  New      PT LONG TERM GOAL #4   Title  BERG balance score improved to 46/56 indicating improved  gait safety and decreased risk of falls    Time  8    Period  Weeks    Status  New      PT LONG TERM GOAL #5   Title  Activities Based Confidence Scale improved to 59%    Time  8    Period  Weeks    Status  New            Plan - 12/02/18 1729    Clinical Impression Statement  Although the patient has no improvement with Activities Based Balance Confidence Scale, she reports improved balance with functional activities like cleaning out her her fridge and making the bed.  Her BERG balacne test has improved from 40/56 to 43/56.  She is able to walk 120 feet without assistive device with O2 however her saturation rate drops to 85%.  Within 15 sec improves to 95%.  Her shortness of breath is a significant limiting factor for standing and walking more than her back pain.  She has made fair progress with STGS with limited visits secondary to transportation issues.  She states she has a new transportation system in place through [place] which will be better now.  Therapist closely monitoring respiratory status throughout treatment session with necessary modifications to activity.    Comorbidities  cardiac history with bypass surgery 2 years ago, diabetes, HTN, cellulitis, kidney failure, O2 dependent; obesity, asthma; pacemaker    Examination-Activity Limitations  Reach Overhead;Locomotion Level;Stand;Stairs;Lift;Carry    Examination-Participation Restrictions  Church;Meal Prep;Cleaning;Community Activity;Shop    Rehab Potential  Good    PT Frequency  2x / week    PT Duration  8 weeks    PT Treatment/Interventions  ADLs/Self Care Home Management;Gait training;DME Instruction;Functional mobility training;Stair training;Therapeutic activities;Therapeutic exercise;Neuromuscular re-education;Manual techniques;Patient/family education    PT Next Visit Plan  sitting ex for core and LE strengthening;   walking in clinic (encourage slower pace);  f/u on use of yellow band with HEP, low level standing  balance in short bouts secondary to pain with prolonged standing;  Nu-Step;  O2 saturation checks    PT Home Exercise Plan  Access Code: [email or web address]       Patient will benefit from skilled therapeutic intervention in order to improve the following deficits and impairments:  Obesity, Pain, Decreased strength, Decreased activity tolerance, Difficulty walking, Impaired perceived functional ability  Visit Diagnosis: Muscle weakness (generalized)  Difficulty in walking, not elsewhere classified  Repeated falls     Problem List Patient Active Problem List   Diagnosis Date Noted  . Constipation 10/21/2018  . Leukopenia 05/03/2018  . Thrombocytopenia ([place]) 05/03/2018  . Pneumonia due to human metapneumovirus 05/02/2018  . Chest pain 05/01/2018  . Allergic rhinitis 04/13/2018  . S/P laparoscopic cholecystectomy  04/09/2018  . Chronic respiratory failure with hypoxia and hypercapnia ([place]) 03/07/2018  . Polyp of cecum   . Polyp of transverse colon   . Positive colorectal cancer screening using Cologuard test   . Dysphagia   . Acute on chronic renal failure ([place]) 11/20/2017  . CAD (coronary artery disease) 11/20/2017  . DM2 (diabetes mellitus, type 2) ([place]) 11/20/2017  . Abdominal pain, epigastric 11/20/2017  . Vasomotor rhinitis 10/16/2016  . Right facial numbness 03/05/2016  . CKD (chronic kidney disease), stage III 03/05/2016  . Atrial fibrillation ([place]) 10/24/2015  . Chest pain with moderate risk of acute coronary syndrome 10/23/2015  . History of TIA (transient ischemic attack) 10/22/2015  . Complete heart block ([place]) 06/22/2015  . Allergic drug rash due to anti-infective agent 04/29/2015  . Fatigue 02/14/2015  . Chronic diastolic CHF (congestive heart failure) ([place]) 02/14/2015  . Cellulitis 12/21/2014  . S/P CABG x 5 11/29/2014  . CAD S/P percutaneous coronary angioplasty   . Diarrhea 07/12/2014  . Generalized abdominal pain 07/12/2014  . Diastolic CHF, acute on chronic (HCC)  11/04/2013  . Mixed hypercholesterolemia and hypertriglyceridemia 04/22/2013  . Vertigo 04/22/2013  . Dyspnea on exertion 08/25/2012  . Allergic to IV contrast 07/23/2012  . Unstable angina ([place]) 07/22/2012  . Morbid (severe) obesity due to excess calories ([place]) 11/22/2011  . Nonischemic cardiomyopathy ([place]) 11/22/2011  . Gastroesophageal reflux disease 11/22/2011  . Back pain 11/22/2011  . OSA (obstructive sleep apnea) 11/22/2011  . HEMORRHOIDS-EXTERNAL 02/21/2010  . NAUSEA 02/21/2010  . ABDOMINAL PAIN -GENERALIZED 02/21/2010  . PERSONAL HX COLONIC POLYPS 02/21/2010  . ANEMIA 04/16/2007  . Essential hypertension 04/16/2007  . DIVERTICULOSIS, COLON 04/16/2007  . ARTHRITIS 04/16/2007  . INTERNAL HEMORRHOIDS WITHOUT MENTION COMP 04/12/2007  . Asthma 04/12/2007  . Cardiac resynchronization therapy pacemaker (CRT-P) in place 04/12/2007  . Gastritis without bleeding 12/14/2006  . COLONIC POLYPS, HYPERPLASTIC 09/27/2002  . FATTY LIVER DISEASE 02/16/2002   Ruben Im, PT 12/02/18 5:37 PM Phone: [phone] Fax: [phone] [name] 12/02/2018, 5:37 PM  [facility] [address] [place], [place], [address] Phone: [phone]   Fax:  [phone]  Name: [name] MRN: [id number] Date of Birth: [birth year]

## 2018-12-06 ENCOUNTER — Ambulatory Visit: Payer: Medicare HMO | Admitting: Physical Therapy

## 2018-12-06 ENCOUNTER — Encounter: Payer: Self-pay | Admitting: Physical Therapy

## 2018-12-06 ENCOUNTER — Other Ambulatory Visit: Payer: Self-pay

## 2018-12-06 DIAGNOSIS — R269 Unspecified abnormalities of gait and mobility: Secondary | ICD-10-CM | POA: Diagnosis not present

## 2018-12-06 DIAGNOSIS — R262 Difficulty in walking, not elsewhere classified: Secondary | ICD-10-CM | POA: Diagnosis not present

## 2018-12-06 DIAGNOSIS — I5032 Chronic diastolic (congestive) heart failure: Secondary | ICD-10-CM | POA: Diagnosis not present

## 2018-12-06 DIAGNOSIS — R062 Wheezing: Secondary | ICD-10-CM | POA: Diagnosis not present

## 2018-12-06 DIAGNOSIS — J9611 Chronic respiratory failure with hypoxia: Secondary | ICD-10-CM | POA: Diagnosis not present

## 2018-12-06 DIAGNOSIS — R296 Repeated falls: Secondary | ICD-10-CM

## 2018-12-06 DIAGNOSIS — M6281 Muscle weakness (generalized): Secondary | ICD-10-CM | POA: Diagnosis not present

## 2018-12-06 DIAGNOSIS — R6889 Other general symptoms and signs: Secondary | ICD-10-CM | POA: Diagnosis not present

## 2018-12-06 NOTE — Therapy (Signed)
[facility] [address] [place], [place], [address] Phone: [phone]   Fax:  [phone]  Physical Therapy Treatment  Patient Details  Name: [name] MRN: [id number] Date of Birth: February 10, 1946 Referring Provider (PT): Dr. Dorthy Cooler   Encounter Date: 12/06/2018  PT End of Session - 12/06/18 1357    Visit Number  6    Date for PT Re-Evaluation  12/23/18    Authorization Type  [employer]    PT Start Time  5427    PT Stop Time  1442    PT Time Calculation (min)  45 min    Activity Tolerance  Patient tolerated treatment well;Patient limited by fatigue    Behavior During Therapy  [facility] for tasks assessed/performed       Past Medical History:  Diagnosis Date  . AICD (automatic cardioverter/defibrillator) present   . Anemia   . Arthritis   . Asthma   . CAD (coronary artery disease)    a. 10/09/16 LHC: SVG->LAD patent, SVG->Diag patent, SVG->RCA patent, SVG->LCx occluded. EF 60%, b. 10/31/16 LHC DES to AV groove Circ, DES to intermed branch  . Cellulitis and abscess of foot 12/2014   RT FOOT  . CHF (congestive heart failure) ([place])   . Chronic bronchitis ([place])   . Chronic lower back pain   . Diverticulosis   . Facial numbness 02/2016  . Fatty liver disease, nonalcoholic   . Gastritis   . GERD (gastroesophageal reflux disease)   . Gout   . H/O hiatal hernia   . High cholesterol   . Hyperplastic colon polyp   . Hypertension   . IBS (irritable bowel syndrome)   . Internal hemorrhoids   . Ischemic cardiomyopathy   . Obesity   . On home oxygen therapy    "2L at night" (10/31/2016)  . OSA (obstructive sleep apnea)    "can't tolerate a mask" (10/30/2016)  . Pneumonia    "couple times" (10/31/2016)  . Shortness of breath   . TIA (transient ischemic attack)    "recently" (10/31/2016)  . Type II diabetes mellitus ([place])     Past Surgical History:  Procedure Laterality Date  . ABDOMINAL ULTRASOUND  12/01/2011    Peripelvic cysts- #1- 2.4x1.9x2.3cm, #2-1.2x0.9x1.2cm  . ANKLE FRACTURE SURGERY Right    "had rod put in"  . ANTERIOR CERVICAL DECOMP/DISCECTOMY FUSION    . APPENDECTOMY    . BACK SURGERY    . BIOPSY  12/29/2017   Procedure: BIOPSY;  Surgeon: [name], MD;  Location: [facility];  Service: Endoscopy;;  . BIV ICD INSERTION CRT-D  2001?  [name] BIV PACEMAKER GENERATOR CHANGE OUT N/A 11/02/2012   Procedure: BIV PACEMAKER GENERATOR CHANGE OUT;  Surgeon: Sanda Klein, MD;  Location: [place] CATH LAB;  Service: Cardiovascular;  Laterality: N/A;  . CARDIAC CATHETERIZATION  05/17/1999   No significant coronary obstructive disease w/ mild 20% luminal irregularity of the first diag branch of the LAD  . CARDIAC CATHETERIZATION  07/08/2002   No significant CAD, moderately depressed LV systolic function  . CARDIAC CATHETERIZATION Bilateral 04/26/2007   Normal findings, recommend medical therapy  . CARDIAC CATHETERIZATION  02/18/2008   Moderate CAD, would benefit from having a functional study, recommend continue medical therapy  . CARDIAC CATHETERIZATION  07/23/2012   Medical therapy  . CARDIAC CATHETERIZATION N/A 11/24/2014   Procedure: Left Heart Cath and Coronary Angiography;  Surgeon: Troy Sine, MD;  Location: [place] CV LAB;  Service: Cardiovascular;  Laterality:  N/A;  . CARDIAC CATHETERIZATION  11/27/2014   Procedure: Intravascular Pressure Wire/FFR Study;  Surgeon: Peter M Martinique, MD;  Location: [place] CV LAB;  Service: Cardiovascular;;  . CARDIAC CATHETERIZATION  10/09/2016  . CHOLECYSTECTOMY N/A 04/09/2018   Procedure: LAPAROSCOPIC CHOLECYSTECTOMY;  Surgeon: Greer Pickerel, MD;  [facility]: [facility];  Service: General;  Laterality: N/A;  . COLONOSCOPY WITH PROPOFOL N/A 12/29/2017   Procedure: COLONOSCOPY WITH PROPOFOL;  Surgeon: [name], MD;  [facility];  Service: Endoscopy;  Laterality: N/A;  . CORONARY ANGIOGRAM  2010  . CORONARY ARTERY BYPASS GRAFT N/A  11/29/2014   Procedure: CORONARY ARTERY BYPASS GRAFTING x 5 (LIMA-LAD, SVG-D, SVG-OM1-OM2, SVG-PD);  Surgeon: Melrose Nakayama, MD;  Location: [place];  Service: Open Heart Surgery;  Laterality: N/A;  . CORONARY STENT INTERVENTION N/A 10/31/2016   Procedure: CORONARY STENT INTERVENTION;  Surgeon: Burnell Blanks, MD;  Location: [place] CV LAB;  Service: Cardiovascular;  Laterality: N/A;  . ESOPHAGOGASTRODUODENOSCOPY (EGD) WITH PROPOFOL N/A 12/29/2017   Procedure: ESOPHAGOGASTRODUODENOSCOPY (EGD) WITH PROPOFOL;  Surgeon: [name], MD;  [facility];  Service: Endoscopy;  Laterality: N/A;  . FRACTURE SURGERY    . INSERT / REPLACE / [place]  . KNEE ARTHROSCOPY Bilateral   . LEFT HEART CATH AND CORS/GRAFTS ANGIOGRAPHY N/A 12/09/2017   Procedure: LEFT HEART CATH AND CORS/GRAFTS ANGIOGRAPHY;  Surgeon: Troy Sine, MD;  Location: [place] CV LAB;  Service: Cardiovascular;  Laterality: N/A;  . LEFT HEART CATHETERIZATION WITH CORONARY ANGIOGRAM N/A 07/23/2012   Procedure: LEFT HEART CATHETERIZATION WITH CORONARY ANGIOGRAM;  Surgeon: Leonie Man, MD;  Location[facility]ATH LAB;  Service: Cardiovascular;  Laterality: N/A;  Carlton Adam MYOVIEW  11/14/2011   Mild-moderate defect seen in Mid Inferolateral and Mid Anterolateral regions-consistant w/ infarct/scar. No significant ischemia demonstrated.  [name] POLYPECTOMY  12/29/2017   Procedure: POLYPECTOMY;  Surgeon: [name], MD;  [facility];  Service: Endoscopy;;  . RIGHT/LEFT HEART CATH AND CORONARY ANGIOGRAPHY N/A 10/09/2016   Procedure: RIGHT/LEFT HEART CATH AND CORONARY ANGIOGRAPHY;  Surgeon: Jolaine Artist, MD;  Location: [place] CV LAB;  Service: Cardiovascular;  Laterality: N/A;  . TEE WITHOUT CARDIOVERSION N/A 11/29/2014   Procedure: TRANSESOPHAGEAL ECHOCARDIOGRAM (TEE);  Surgeon: Melrose Nakayama, MD;  Location: [place];  Service: Open Heart Surgery;  Laterality: N/A;   . TRANSTHORACIC ECHOCARDIOGRAM  07/23/2012   EF 55-60%, normal-mild  . TUBAL LIGATION      There were no vitals filed for this visit.  Subjective Assessment - 12/06/18 1401    Subjective  My Rt leg hurts.  I'm using the yellow band now at home.  HEP going well.    Pertinent History  3L O2 dependent;  chronic LBP; diabetes;  CABG x5;  left ankle fracture; pacemaker    Limitations  House hold activities;Walking;Standing    How long can you stand comfortably?  < 20 minutes to cook supper    How long can you walk comfortably?  living room to bathroom    Diagnostic tests  no    Patient Stated Goals  be able to walk and not be afraid to fall;  go to church by myself    Currently in Pain?  Yes    Pain Score  8     Pain Location  Leg    Pain Orientation  Right    Pain Descriptors / Indicators  Aching    Pain Type  Chronic pain  Pain Radiating Towards  Rt hip and anterior thigh    Pain Onset  More than a month ago    Pain Frequency  Intermittent    Aggravating Factors   walking, standing    Pain Relieving Factors  sitting                       [facility] Adult PT Treatment/Exercise - 12/06/18 0001      Ambulation/Gait   Ambulation/Gait  Yes    Ambulation Distance (Feet)  160 Feet    Gait Comments  O2 Sat remained 98, PT cued Pt to go slower but pt did keep a faster pace      Lumbar Exercises: Stretches   Other Lumbar Stretch Exercise  large blue ball rollouts for lumbar fleixon x 20 reps, improved Rt LE pain with this stretch      Lumbar Exercises: Seated   Other Seated Lumbar Exercises  3# dumbbell hip to hip, hip to shoulder 2x5 each for core      Knee/Hip Exercises: Standing   Hip Flexion Limitations  marching toe taps on 6" step with bil handrails x 20 alt, O2 Sat dropped to 89%      Knee/Hip Exercises: Seated   Long Arc Quad  Strengthening;Both;2 sets;15 reps    Long Arc Quad Limitations  yellow band around ankles    Clamshell with TheraBand  Yellow     Knee/Hip Flexion  15 reps    Marching  Strengthening;Both;20 reps    Marching Limitations  yellow band around knees    Sit to [employer]  5 reps   o2 sat dropped to 92%     Ankle Exercises: Seated   Heel Raises  20 reps    Toe Raise  20 reps          Balance Exercises - 12/06/18 1440      Balance Exercises: Standing   Standing, One Foot on a Step  Eyes open;6 inch;10 secs;20 secs;3 reps;Foam/compliant surface   bil UE support on bil handrails, 2nd set on foam x 20 sec         PT Short Term Goals - 12/06/18 1423      PT SHORT TERM GOAL #2   Title  The patient will be able to ambulate 160 feet with RW with supervision    Baseline  160' without AD, O2 sat remained above 95%    Status  Achieved        PT Long Term Goals - 10/28/18 2014      PT LONG TERM GOAL #1   Title  The patient will be independent with safe self progression of HEP    Time  8    Period  Weeks    Status  New    Target Date  12/23/18      PT LONG TERM GOAL #2   Title  The patient will be able to ambulate 250 feet needed to attend church    Time  8    Period  Weeks    Status  New      PT LONG TERM GOAL #3   Title  Improved LE strength to grossly 4/5 to 4+/5 as indicated by increased 5x sit to stand speed to 15 sec    Time  8    Period  Weeks    Status  New      PT LONG TERM GOAL #4   Title  BERG balance score improved  to 46/56 indicating improved gait safety and decreased risk of falls    Time  8    Period  Weeks    Status  New      PT LONG TERM GOAL #5   Title  Activities Based Confidence Scale improved to 59%    Time  8    Period  Weeks    Status  New            Plan - 12/06/18 1424    Clinical Impression Statement  Pt arrived with Rt LE pain in hip, buttock and thigh but this improved (0/10) with seated lumbar flexion and LE exercise.  She met STG ambulating 160' today without AD and good O2 Sat at beginning of session.  She dropped to upper 80s with sit to stand and standing toe  taps today and was able to return to 90s (consistently between 92-95%) throughout remainder of session.  She is more limited by oxygenation with standing ther ex than with muscle fatigue and pain.  She was unable to perform NuStep end of session due to dropping O2 rate to upper 80s which took longer to come up back and we ran out of time.  She left with O2 Sat of 95%.  She will continue to benefit from skilled progression of PT with careful monitoring of O2 sat and exercise tolerance.    Comorbidities  cardiac history with bypass surgery 2 years ago, diabetes, HTN, cellulitis, kidney failure, O2 dependent; obesity, asthma; pacemaker    Rehab Potential  Good    PT Frequency  2x / week    PT Duration  8 weeks    PT Treatment/Interventions  ADLs/Self Care Home Management;Gait training;DME Instruction;Functional mobility training;Stair training;Therapeutic activities;Therapeutic exercise;Neuromuscular re-education;Manual techniques;Patient/family education    PT Next Visit Plan  f/u on tolerance of added standing ex last visit, monitor O2 sat, sitting for core and LE strength, gait for endurance    PT Home Exercise Plan  Access Code: [email or web address]    Consulted and Agree with Plan of Care  Patient       Patient will benefit from skilled therapeutic intervention in order to improve the following deficits and impairments:     Visit Diagnosis: Muscle weakness (generalized)  Difficulty in walking, not elsewhere classified  Repeated falls     Problem List Patient Active Problem List   Diagnosis Date Noted  . Constipation 10/21/2018  . Leukopenia 05/03/2018  . Thrombocytopenia ([place]) 05/03/2018  . Pneumonia due to human metapneumovirus 05/02/2018  . Chest pain 05/01/2018  . Allergic rhinitis 04/13/2018  . S/P laparoscopic cholecystectomy 04/09/2018  . Chronic respiratory failure with hypoxia and hypercapnia ([place]) 03/07/2018  . Polyp of cecum   . Polyp of transverse colon   . Positive colorectal  cancer screening using Cologuard test   . Dysphagia   . Acute on chronic renal failure ([place]) 11/20/2017  . CAD (coronary artery disease) 11/20/2017  . DM2 (diabetes mellitus, type 2) ([place]) 11/20/2017  . Abdominal pain, epigastric 11/20/2017  . Vasomotor rhinitis 10/16/2016  . Right facial numbness 03/05/2016  . CKD (chronic kidney disease), stage III 03/05/2016  . Atrial fibrillation ([place]) 10/24/2015  . Chest pain with moderate risk of acute coronary syndrome 10/23/2015  . History of TIA (transient ischemic attack) 10/22/2015  . Complete heart block ([place]) 06/22/2015  . Allergic drug rash due to anti-infective agent 04/29/2015  . Fatigue 02/14/2015  . Chronic diastolic CHF (congestive heart failure) ([place]) 02/14/2015  . Cellulitis 12/21/2014  .  S/P CABG x 5 11/29/2014  . CAD S/P percutaneous coronary angioplasty   . Diarrhea 07/12/2014  . Generalized abdominal pain 07/12/2014  . Diastolic CHF, acute on chronic (HCC) 11/04/2013  . Mixed hypercholesterolemia and hypertriglyceridemia 04/22/2013  . Vertigo 04/22/2013  . Dyspnea on exertion 08/25/2012  . Allergic to IV contrast 07/23/2012  . Unstable angina ([place]) 07/22/2012  . Morbid (severe) obesity due to excess calories ([place]) 11/22/2011  . Nonischemic cardiomyopathy ([place]) 11/22/2011  . Gastroesophageal reflux disease 11/22/2011  . Back pain 11/22/2011  . OSA (obstructive sleep apnea) 11/22/2011  . HEMORRHOIDS-EXTERNAL 02/21/2010  . NAUSEA 02/21/2010  . ABDOMINAL PAIN -GENERALIZED 02/21/2010  . PERSONAL HX COLONIC POLYPS 02/21/2010  . ANEMIA 04/16/2007  . Essential hypertension 04/16/2007  . DIVERTICULOSIS, COLON 04/16/2007  . ARTHRITIS 04/16/2007  . INTERNAL HEMORRHOIDS WITHOUT MENTION COMP 04/12/2007  . Asthma 04/12/2007  . Cardiac resynchronization therapy pacemaker (CRT-P) in place 04/12/2007  . Gastritis without bleeding 12/14/2006  . COLONIC POLYPS, HYPERPLASTIC 09/27/2002  . FATTY LIVER DISEASE 02/16/2002   Baruch Merl, PT 12/06/18 2:46 PM   [facility] [address] [place], [place], [address] Phone: [phone]   Fax:  [phone]  Name: DANI WALLNER MRN: [id number] Date of Birth: 1945-07-29

## 2018-12-08 DIAGNOSIS — R519 Headache, unspecified: Secondary | ICD-10-CM | POA: Diagnosis not present

## 2018-12-08 DIAGNOSIS — G8929 Other chronic pain: Secondary | ICD-10-CM | POA: Diagnosis not present

## 2018-12-08 DIAGNOSIS — R0981 Nasal congestion: Secondary | ICD-10-CM | POA: Diagnosis not present

## 2018-12-08 DIAGNOSIS — R0982 Postnasal drip: Secondary | ICD-10-CM | POA: Diagnosis not present

## 2018-12-09 ENCOUNTER — Ambulatory Visit: Payer: Medicare HMO | Admitting: Physical Therapy

## 2018-12-09 ENCOUNTER — Other Ambulatory Visit: Payer: Self-pay

## 2018-12-09 DIAGNOSIS — R296 Repeated falls: Secondary | ICD-10-CM

## 2018-12-09 DIAGNOSIS — R262 Difficulty in walking, not elsewhere classified: Secondary | ICD-10-CM | POA: Diagnosis not present

## 2018-12-09 DIAGNOSIS — R6889 Other general symptoms and signs: Secondary | ICD-10-CM | POA: Diagnosis not present

## 2018-12-09 DIAGNOSIS — M6281 Muscle weakness (generalized): Secondary | ICD-10-CM

## 2018-12-09 NOTE — Therapy (Signed)
[facility] [address], [place], [place], [address] Phone: [phone]   Fax:  [phone]  Physical Therapy Treatment  Patient Details  Name: [name] MRN: [id number] Date of Birth: [birth year] Referring Provider (PT): Dr. Dorthy Cooler   Encounter Date: 12/09/2018  PT End of Session - 12/09/18 1441    Visit Number  7    Date for PT Re-Evaluation  12/23/18    Authorization Type  [employer]    PT Start Time  1401    PT Stop Time  1440    PT Time Calculation (min)  39 min    Activity Tolerance  Patient tolerated treatment well;Patient limited by fatigue       Past Medical History:  Diagnosis Date  . AICD (automatic cardioverter/defibrillator) present   . Anemia   . Arthritis   . Asthma   . CAD (coronary artery disease)    a. 10/09/16 LHC: SVG->LAD patent, SVG->Diag patent, SVG->RCA patent, SVG->LCx occluded. EF 60%, b. 10/31/16 LHC DES to AV groove Circ, DES to intermed branch  . Cellulitis and abscess of foot 12/2014   RT FOOT  . CHF (congestive heart failure) ([place])   . Chronic bronchitis ([place])   . Chronic lower back pain   . Diverticulosis   . Facial numbness 02/2016  . Fatty liver disease, nonalcoholic   . Gastritis   . GERD (gastroesophageal reflux disease)   . Gout   . H/O hiatal hernia   . High cholesterol   . Hyperplastic colon polyp   . Hypertension   . IBS (irritable bowel syndrome)   . Internal hemorrhoids   . Ischemic cardiomyopathy   . Obesity   . On home oxygen therapy    "2L at night" (10/31/2016)  . OSA (obstructive sleep apnea)    "can't tolerate a mask" (10/30/2016)  . Pneumonia    "couple times" (10/31/2016)  . Shortness of breath   . TIA (transient ischemic attack)    "recently" (10/31/2016)  . Type II diabetes mellitus ([place])     Past Surgical History:  Procedure Laterality Date  . ABDOMINAL ULTRASOUND  12/01/2011   Peripelvic cysts- #1- 2.4x1.9x2.3cm, #2-1.2x0.9x1.2cm  .  ANKLE FRACTURE SURGERY Right    "had rod put in"  . ANTERIOR CERVICAL DECOMP/DISCECTOMY FUSION    . APPENDECTOMY    . BACK SURGERY    . BIOPSY  12/29/2017   Procedure: BIOPSY;  Surgeon: [name], MD;  Location: [facility];  Service: Endoscopy;;  . BIV ICD INSERTION CRT-D  2001?  [name] BIV PACEMAKER GENERATOR CHANGE OUT N/A 11/02/2012   Procedure: BIV PACEMAKER GENERATOR CHANGE OUT;  Surgeon: Sanda Klein, MD;  Location: [place] CATH [facility];  Service: Cardiovascular;  Laterality: N/A;  . CARDIAC CATHETERIZATION  05/17/1999   No significant coronary obstructive disease w/ mild 20% luminal irregularity of the first diag branch of the LAD  . CARDIAC CATHETERIZATION  07/08/2002   No significant CAD, moderately depressed LV systolic function  . CARDIAC CATHETERIZATION Bilateral 04/26/2007   Normal findings, recommend medical therapy  . CARDIAC CATHETERIZATION  02/18/2008   Moderate CAD, would benefit from having a functional study, recommend continue medical therapy  . CARDIAC CATHETERIZATION  07/23/2012   Medical therapy  . CARDIAC CATHETERIZATION N/A 11/24/2014   Procedure: Left Heart Cath and Coronary Angiography;  Surgeon: Troy Sine, MD;  Location: [place] CV [facility];  Service: Cardiovascular;  Laterality: N/A;  . CARDIAC CATHETERIZATION  11/27/2014   Procedure: Intravascular  Pressure Wire/FFR Study;  Surgeon: Peter M Martinique, MD;  Location: [place] [facility];  Service: Cardiovascular;;  . CARDIAC CATHETERIZATION  10/09/2016  . CHOLECYSTECTOMY N/A 04/09/2018   Procedure: LAPAROSCOPIC CHOLECYSTECTOMY;  Surgeon: Greer Pickerel, MD;  Location: [facility];  Service: General;  Laterality: N/A;  . COLONOSCOPY WITH PROPOFOL N/A 12/29/2017   Procedure: COLONOSCOPY WITH PROPOFOL;  Surgeon: [name], MD;  Location: [facility];  Service: Endoscopy;  Laterality: N/A;  . CORONARY ANGIOGRAM  2010  . CORONARY ARTERY BYPASS GRAFT N/A 11/29/2014   Procedure: CORONARY ARTERY BYPASS GRAFTING x 5  (LIMA-LAD, SVG-D, SVG-OM1-OM2, SVG-PD);  Surgeon: Melrose Nakayama, MD;  Location: [place];  Service: Open Heart Surgery;  Laterality: N/A;  . CORONARY STENT INTERVENTION N/A 10/31/2016   Procedure: CORONARY STENT INTERVENTION;  Surgeon: Burnell Blanks, MD;  Location: [place] [facility] LAB;  Service: Cardiovascular;  Laterality: N/A;  . ESOPHAGOGASTRODUODENOSCOPY (EGD) WITH PROPOFOL N/A 12/29/2017   Procedure: ESOPHAGOGASTRODUODENOSCOPY (EGD) WITH PROPOFOL;  Surgeon: [name], MD;  [facility];  Service: Endoscopy;  Laterality: N/A;  . FRACTURE SURGERY    . INSERT / REPLACE / [place]  . KNEE ARTHROSCOPY Bilateral   . LEFT HEART CATH AND CORS/GRAFTS ANGIOGRAPHY N/A 12/09/2017   Procedure: LEFT HEART CATH AND CORS/GRAFTS ANGIOGRAPHY;  Surgeon: Troy Sine, MD;  Location: [place] [facility];  Service: Cardiovascular;  Laterality: N/A;  . LEFT HEART CATHETERIZATION WITH CORONARY ANGIOGRAM N/A 07/23/2012   Procedure: LEFT HEART CATHETERIZATION WITH CORONARY ANGIOGRAM;  Surgeon: Leonie Man, MD;  Location: [facility] CATH LAB;  Service: Cardiovascular;  Laterality: N/A;  Carlton Adam MYOVIEW  11/14/2011   Mild-moderate defect seen in Mid Inferolateral and Mid Anterolateral regions-consistant w/ infarct/scar. No significant ischemia demonstrated.  [name] POLYPECTOMY  12/29/2017   Procedure: POLYPECTOMY;  Surgeon: [name], MD;  [facility]: [facility];  Service: Endoscopy;;  . RIGHT/LEFT HEART CATH AND CORONARY ANGIOGRAPHY N/A 10/09/2016   Procedure: RIGHT/LEFT HEART CATH AND CORONARY ANGIOGRAPHY;  Surgeon: Jolaine Artist, MD;  Location: [place] [facility] LAB;  Service: Cardiovascular;  Laterality: N/A;  . TEE WITHOUT CARDIOVERSION N/A 11/29/2014   Procedure: TRANSESOPHAGEAL ECHOCARDIOGRAM (TEE);  Surgeon: Melrose Nakayama, MD;  Location: [facility];  Service: Open Heart Surgery;  Laterality: N/A;  . TRANSTHORACIC ECHOCARDIOGRAM  07/23/2012   EF 55-60%,  normal-mild  . TUBAL LIGATION      There were no vitals filed for this visit.  Subjective Assessment - 12/09/18 1401    Subjective  Nothing new.  I didn't sleep too well last night.  Did pretty good after last time.  I got panicky yesterday when the power went out and my portable O2 tank was low.    I don't like to exercise.  I never have.    Pertinent History  3L O2 dependent;  chronic LBP; diabetes;  CABG x5;  left ankle fracture; pacemaker    Patient Stated Goals  be able to walk and not be afraid to fall;  go to church by myself    Currently in Pain?  Yes    Pain Score  3     Pain Location  Hip    Pain Orientation  Right    Aggravating Factors   walking/standing                       [facility] Adult PT Treatment/Exercise - 12/09/18 0001      Therapeutic Activites  Therapeutic Activities  ADL's    ADL's  walking, standing, sit to stand      Lumbar Exercises: Stretches   Other Lumbar Stretch Exercise  large blue ball rollouts for lumbar fleixon x 20 reps, improved Rt LE pain with this stretch      Lumbar Exercises: Standing   Other Standing Lumbar Exercises  5# hip hinge 5x2       Knee/Hip Exercises: Standing   Hip Abduction  AROM;Right;Left;2 sets;5 reps    Hip Extension  AROM;Right;Left;2 sets;5 reps    Forward Step Up  Right;Left;1 set;5 reps;Hand Hold: 2;Step Height: 2"    Other Standing Knee Exercises  2 sets of 5 step taps.        Knee/Hip Exercises: Seated   Long Arc Quad  Strengthening;Right;Left;2 sets;10 reps    Long Arc Quad Weight  4 lbs.    Clamshell with TheraBand  Red   20x    Marching  Strengthening;Right;Left;10 reps    Marching Limitations  red band around thighs    Sit to Sand  2 sets;5 reps;without UE support   black cushion in chair;  92% O2 sat following     Shoulder Exercises: Seated   Extension  Strengthening;Both;20 reps;Theraband    Theraband Level (Shoulder Extension)  Level 2 (Red)    Other Seated Exercises  2# press up 10x  right/left                PT Short Term Goals - 12/06/18 1423      PT SHORT TERM GOAL #2   Title  The patient will be able to ambulate 160 feet with RW with supervision    Baseline  160' without AD, O2 sat remained above 95%    Status  Achieved        PT Long Term Goals - 10/28/18 2014      PT LONG TERM GOAL #1   Title  The patient will be independent with safe self progression of HEP    Time  8    Period  Weeks    Status  New    Target Date  12/23/18      PT LONG TERM GOAL #2   Title  The patient will be able to ambulate 250 feet needed to attend church    Time  8    Period  Weeks    Status  New      PT LONG TERM GOAL #3   Title  Improved LE strength to grossly 4/5 to 4+/5 as indicated by increased 5x sit to stand speed to 15 sec    Time  8    Period  Weeks    Status  New      PT LONG TERM GOAL #4   Title  BERG balance score improved to 46/56 indicating improved gait safety and decreased risk of falls    Time  8    Period  Weeks    Status  New      PT LONG TERM GOAL #5   Title  Activities Based Confidence Scale improved to 59%    Time  8    Period  Weeks    Status  New            Plan - 12/09/18 1442    Clinical Impression Statement  The patient is able to progress intensity of strengthening (more reps or resistance) today with O2 saturation remaining 92% or higher with spot checks every 5 minutes.  She reports LB/hip pain no worse with exercise.  Therapist closely monitoring respiratory response and pain level throughout treatment session.    Comorbidities  cardiac history with bypass surgery 2 years ago, diabetes, HTN, cellulitis, kidney failure, O2 dependent; obesity, asthma; pacemaker    Examination-Participation Restrictions  Church;Meal Prep;Cleaning;Community Activity;Shop    Rehab Potential  Good    PT Frequency  2x / week    PT Duration  8 weeks    PT Treatment/Interventions  ADLs/Self Care Home Management;Gait training;DME  Instruction;Functional mobility training;Stair training;Therapeutic activities;Therapeutic exercise;Neuromuscular re-education;Manual techniques;Patient/family education    PT Next Visit Plan  f/u on tolerance of added standing ex last visit, monitor O2 sat, sitting for core and LE strength, gait for endurance    PT Home Exercise Plan  Access Code: [email or web address]       Patient will benefit from skilled therapeutic intervention in order to improve the following deficits and impairments:  Obesity, Pain, Decreased strength, Decreased activity tolerance, Difficulty walking, Impaired perceived functional ability  Visit Diagnosis: Muscle weakness (generalized)  Difficulty in walking, not elsewhere classified  Repeated falls     Problem List Patient Active Problem List   Diagnosis Date Noted  . Constipation 10/21/2018  . Leukopenia 05/03/2018  . Thrombocytopenia ([place]) 05/03/2018  . Pneumonia due to human metapneumovirus 05/02/2018  . Chest pain 05/01/2018  . Allergic rhinitis 04/13/2018  . S/P laparoscopic cholecystectomy 04/09/2018  . Chronic respiratory failure with hypoxia and hypercapnia ([place]) 03/07/2018  . Polyp of cecum   . Polyp of transverse colon   . Positive colorectal cancer screening using Cologuard test   . Dysphagia   . Acute on chronic renal failure ([place]) 11/20/2017  . CAD (coronary artery disease) 11/20/2017  . DM2 (diabetes mellitus, type 2) ([place]) 11/20/2017  . Abdominal pain, epigastric 11/20/2017  . Vasomotor rhinitis 10/16/2016  . Right facial numbness 03/05/2016  . CKD (chronic kidney disease), stage III 03/05/2016  . Atrial fibrillation ([place]) 10/24/2015  . Chest pain with moderate risk of acute coronary syndrome 10/23/2015  . History of TIA (transient ischemic attack) 10/22/2015  . Complete heart block ([place]) 06/22/2015  . Allergic drug rash due to anti-infective agent 04/29/2015  . Fatigue 02/14/2015  . Chronic diastolic CHF (congestive heart failure) ([place])  02/14/2015  . Cellulitis 12/21/2014  . S/P CABG x 5 11/29/2014  . CAD S/P percutaneous coronary angioplasty   . Diarrhea 07/12/2014  . Generalized abdominal pain 07/12/2014  . Diastolic CHF, acute on chronic (HCC) 11/04/2013  . Mixed hypercholesterolemia and hypertriglyceridemia 04/22/2013  . Vertigo 04/22/2013  . Dyspnea on exertion 08/25/2012  . Allergic to IV contrast 07/23/2012  . Unstable angina ([place]) 07/22/2012  . Morbid (severe) obesity due to excess calories ([place]) 11/22/2011  . Nonischemic cardiomyopathy ([place]) 11/22/2011  . Gastroesophageal reflux disease 11/22/2011  . Back pain 11/22/2011  . OSA (obstructive sleep apnea) 11/22/2011  . HEMORRHOIDS-EXTERNAL 02/21/2010  . NAUSEA 02/21/2010  . ABDOMINAL PAIN -GENERALIZED 02/21/2010  . PERSONAL HX COLONIC POLYPS 02/21/2010  . ANEMIA 04/16/2007  . Essential hypertension 04/16/2007  . DIVERTICULOSIS, COLON 04/16/2007  . ARTHRITIS 04/16/2007  . INTERNAL HEMORRHOIDS WITHOUT MENTION COMP 04/12/2007  . Asthma 04/12/2007  . Cardiac resynchronization therapy pacemaker (CRT-P) in place 04/12/2007  . Gastritis without bleeding 12/14/2006  . COLONIC POLYPS, HYPERPLASTIC 09/27/2002  . FATTY LIVER DISEASE 02/16/2002   Ruben Im, PT 12/09/18 5:48 PM Phone: [phone] Fax: [phone] Alvera Singh 12/09/2018, 5:48 PM  [facility] [address],  [place], [place], [address] Phone: [phone]   Fax:  [phone]  Name: [name] MRN: [id number] Date of Birth: May 08, 1945

## 2018-12-10 DIAGNOSIS — E7800 Pure hypercholesterolemia, unspecified: Secondary | ICD-10-CM | POA: Diagnosis not present

## 2018-12-10 DIAGNOSIS — N183 Chronic kidney disease, stage 3 unspecified: Secondary | ICD-10-CM | POA: Diagnosis not present

## 2018-12-10 DIAGNOSIS — E1122 Type 2 diabetes mellitus with diabetic chronic kidney disease: Secondary | ICD-10-CM | POA: Diagnosis not present

## 2018-12-10 DIAGNOSIS — I5032 Chronic diastolic (congestive) heart failure: Secondary | ICD-10-CM | POA: Diagnosis not present

## 2018-12-10 DIAGNOSIS — I10 Essential (primary) hypertension: Secondary | ICD-10-CM | POA: Diagnosis not present

## 2018-12-10 DIAGNOSIS — I2510 Atherosclerotic heart disease of native coronary artery without angina pectoris: Secondary | ICD-10-CM | POA: Diagnosis not present

## 2018-12-10 DIAGNOSIS — E1169 Type 2 diabetes mellitus with other specified complication: Secondary | ICD-10-CM | POA: Diagnosis not present

## 2018-12-10 DIAGNOSIS — J4520 Mild intermittent asthma, uncomplicated: Secondary | ICD-10-CM | POA: Diagnosis not present

## 2018-12-10 DIAGNOSIS — I5033 Acute on chronic diastolic (congestive) heart failure: Secondary | ICD-10-CM | POA: Diagnosis not present

## 2018-12-15 ENCOUNTER — Ambulatory Visit: Payer: Medicare HMO | Attending: Family Medicine | Admitting: Physical Therapy

## 2018-12-15 ENCOUNTER — Other Ambulatory Visit: Payer: Self-pay

## 2018-12-15 ENCOUNTER — Encounter: Payer: Self-pay | Admitting: Physical Therapy

## 2018-12-15 DIAGNOSIS — R262 Difficulty in walking, not elsewhere classified: Secondary | ICD-10-CM | POA: Diagnosis not present

## 2018-12-15 DIAGNOSIS — M6281 Muscle weakness (generalized): Secondary | ICD-10-CM | POA: Insufficient documentation

## 2018-12-15 DIAGNOSIS — R296 Repeated falls: Secondary | ICD-10-CM | POA: Diagnosis not present

## 2018-12-15 DIAGNOSIS — R6889 Other general symptoms and signs: Secondary | ICD-10-CM | POA: Diagnosis not present

## 2018-12-15 NOTE — Therapy (Signed)
[facility] [address] [place], [place], [address] Phone: [phone]   Fax:  [phone]  Physical Therapy Treatment  Patient Details  Name: [name] MRN: [id number] Date of Birth: 12/04/1945 Referring Provider (PT): Dr. Dorthy Cooler   Encounter Date: 12/15/2018  PT End of Session - 12/15/18 1401    Visit Number  8    Date for PT Re-Evaluation  12/23/18    Authorization Type  [employer]    PT Start Time  1401    PT Stop Time  1440    PT Time Calculation (min)  39 min    Activity Tolerance  Patient tolerated treatment well    Behavior During Therapy  [facility] for tasks assessed/performed       Past Medical History:  Diagnosis Date  . AICD (automatic cardioverter/defibrillator) present   . Anemia   . Arthritis   . Asthma   . CAD (coronary artery disease)    a. 10/09/16 LHC: SVG->LAD patent, SVG->Diag patent, SVG->RCA patent, SVG->LCx occluded. EF 60%, b. 10/31/16 LHC DES to AV groove Circ, DES to intermed branch  . Cellulitis and abscess of foot 12/2014   RT FOOT  . CHF (congestive heart failure) ([place])   . Chronic bronchitis ([place])   . Chronic lower back pain   . Diverticulosis   . Facial numbness 02/2016  . Fatty liver disease, nonalcoholic   . Gastritis   . GERD (gastroesophageal reflux disease)   . Gout   . H/O hiatal hernia   . High cholesterol   . Hyperplastic colon polyp   . Hypertension   . IBS (irritable bowel syndrome)   . Internal hemorrhoids   . Ischemic cardiomyopathy   . Obesity   . On home oxygen therapy    "2L at night" (10/31/2016)  . OSA (obstructive sleep apnea)    "can't tolerate a mask" (10/30/2016)  . Pneumonia    "couple times" (10/31/2016)  . Shortness of breath   . TIA (transient ischemic attack)    "recently" (10/31/2016)  . Type II diabetes mellitus ([place])     Past Surgical History:  Procedure Laterality Date  . ABDOMINAL ULTRASOUND  12/01/2011   Peripelvic cysts- #1-  2.4x1.9x2.3cm, #2-1.2x0.9x1.2cm  . ANKLE FRACTURE SURGERY Right    "had rod put in"  . ANTERIOR CERVICAL DECOMP/DISCECTOMY FUSION    . APPENDECTOMY    . BACK SURGERY    . BIOPSY  12/29/2017   Procedure: BIOPSY;  Surgeon: [name], MD;  Location: [facility];  Service: Endoscopy;;  . BIV ICD INSERTION CRT-D  2001?  [name] BIV PACEMAKER GENERATOR CHANGE OUT N/A 11/02/2012   Procedure: BIV PACEMAKER GENERATOR CHANGE OUT;  Surgeon: Sanda Klein, MD;  Location: [place] CATH LAB;  Service: Cardiovascular;  Laterality: N/A;  . CARDIAC CATHETERIZATION  05/17/1999   No significant coronary obstructive disease w/ mild 20% luminal irregularity of the first diag branch of the LAD  . CARDIAC CATHETERIZATION  07/08/2002   No significant CAD, moderately depressed LV systolic function  . CARDIAC CATHETERIZATION Bilateral 04/26/2007   Normal findings, recommend medical therapy  . CARDIAC CATHETERIZATION  02/18/2008   Moderate CAD, would benefit from having a functional study, recommend continue medical therapy  . CARDIAC CATHETERIZATION  07/23/2012   Medical therapy  . CARDIAC CATHETERIZATION N/A 11/24/2014   Procedure: Left Heart Cath and Coronary Angiography;  Surgeon: Troy Sine, MD;  Location: [facility] CV [facility];  Service: Cardiovascular;  Laterality: N/A;  .  CARDIAC CATHETERIZATION  11/27/2014   Procedure: Intravascular Pressure Wire/FFR Study;  Surgeon: Peter M Martinique, MD;  Location: [place] CV LAB;  Service: Cardiovascular;;  . CARDIAC CATHETERIZATION  10/09/2016  . CHOLECYSTECTOMY N/A 04/09/2018   Procedure: LAPAROSCOPIC CHOLECYSTECTOMY;  Surgeon: Greer Pickerel, MD;  [facility]: [facility];  Service: General;  Laterality: N/A;  . COLONOSCOPY WITH PROPOFOL N/A 12/29/2017   Procedure: COLONOSCOPY WITH PROPOFOL;  Surgeon: [name], MD;  [facility];  Service: Endoscopy;  Laterality: N/A;  . CORONARY ANGIOGRAM  2010  . CORONARY ARTERY BYPASS GRAFT N/A 11/29/2014   Procedure:  CORONARY ARTERY BYPASS GRAFTING x 5 (LIMA-LAD, SVG-D, SVG-OM1-OM2, SVG-PD);  Surgeon: Melrose Nakayama, MD;  Location: [facility];  Service: Open Heart Surgery;  Laterality: N/A;  . CORONARY STENT INTERVENTION N/A 10/31/2016   Procedure: CORONARY STENT INTERVENTION;  Surgeon: Burnell Blanks, MD;  Location: [place] CV LAB;  Service: Cardiovascular;  Laterality: N/A;  . ESOPHAGOGASTRODUODENOSCOPY (EGD) WITH PROPOFOL N/A 12/29/2017   Procedure: ESOPHAGOGASTRODUODENOSCOPY (EGD) WITH PROPOFOL;  Surgeon: [name], MD;  [facility];  Service: Endoscopy;  Laterality: N/A;  . FRACTURE SURGERY    . INSERT / REPLACE / [place]  . KNEE ARTHROSCOPY Bilateral   . LEFT HEART CATH AND CORS/GRAFTS ANGIOGRAPHY N/A 12/09/2017   Procedure: LEFT HEART CATH AND CORS/GRAFTS ANGIOGRAPHY;  Surgeon: Troy Sine, MD;  Location: [place] CV [facility];  Service: Cardiovascular;  Laterality: N/A;  . LEFT HEART CATHETERIZATION WITH CORONARY ANGIOGRAM N/A 07/23/2012   Procedure: LEFT HEART CATHETERIZATION WITH CORONARY ANGIOGRAM;  Surgeon: Leonie Man, MD;  Location: [facility] CATH LAB;  Service: Cardiovascular;  Laterality: N/A;  Carlton Adam MYOVIEW  11/14/2011   Mild-moderate defect seen in Mid Inferolateral and Mid Anterolateral regions-consistant w/ infarct/scar. No significant ischemia demonstrated.  [name] POLYPECTOMY  12/29/2017   Procedure: POLYPECTOMY;  Surgeon: [name], MD;  [facility];  Service: Endoscopy;;  . RIGHT/LEFT HEART CATH AND CORONARY ANGIOGRAPHY N/A 10/09/2016   Procedure: RIGHT/LEFT HEART CATH AND CORONARY ANGIOGRAPHY;  Surgeon: Jolaine Artist, MD;  Location: [facility] CV [facility];  Service: Cardiovascular;  Laterality: N/A;  . TEE WITHOUT CARDIOVERSION N/A 11/29/2014   Procedure: TRANSESOPHAGEAL ECHOCARDIOGRAM (TEE);  Surgeon: Melrose Nakayama, MD;  Location: [place];  Service: Open Heart Surgery;  Laterality: N/A;  . TRANSTHORACIC  ECHOCARDIOGRAM  07/23/2012   EF 55-60%, normal-mild  . TUBAL LIGATION      There were no vitals filed for this visit.  Subjective Assessment - 12/15/18 1405    Subjective  Did ok with standing exercises. Still have back pain when I stand.    Pertinent History  3L O2 dependent;  chronic LBP; diabetes;  CABG x5;  left ankle fracture; pacemaker                       [facility] Adult PT Treatment/Exercise - 12/15/18 0001      Ambulation/Gait   Ambulation/Gait  Yes    Ambulation Distance (Feet)  160 Feet   then needed to stop d/t SOB, O2 88%:      Knee/Hip Exercises: Standing   Knee Flexion  AROM;Strengthening;Both;1 set;10 reps    Hip Abduction  AROM;Stengthening;Both;1 set;10 reps;Knee straight    Hip Extension  AROM;Stengthening;Both;1 set;15 reps;Knee straight    Forward Step Up  Both;1 set;10 reps;Hand Hold: 2;Step Height: 4"      Knee/Hip Exercises: Seated   Long Arc Quad  Strengthening;Both;1  set;15 reps;Weights    Long Arc Quad Weight  4 lbs.    Clamshell with TheraBand  Red   20x VC to slow speed/use her msucles   Marching  Strengthening;Both;1 set;15 reps;Weights    Marching Weights  4 lbs.    Sit to Sand  --   2x10 no UE              PT Short Term Goals - 12/06/18 1423      PT SHORT TERM GOAL #2   Title  The patient will be able to ambulate 160 feet with RW with supervision    Baseline  160' without AD, O2 sat remained above 95%    Status  Achieved        PT Long Term Goals - 10/28/18 2014      PT LONG TERM GOAL #1   Title  The patient will be independent with safe self progression of HEP    Time  8    Period  Weeks    Status  New    Target Date  12/23/18      PT LONG TERM GOAL #2   Title  The patient will be able to ambulate 250 feet needed to attend church    Time  8    Period  Weeks    Status  New      PT LONG TERM GOAL #3   Title  Improved LE strength to grossly 4/5 to 4+/5 as indicated by increased 5x sit to stand speed to 15  sec    Time  8    Period  Weeks    Status  New      PT LONG TERM GOAL #4   Title  BERG balance score improved to 46/56 indicating improved gait safety and decreased risk of falls    Time  8    Period  Weeks    Status  New      PT LONG TERM GOAL #5   Title  Activities Based Confidence Scale improved to 59%    Time  8    Period  Weeks    Status  New            Plan - 12/15/18 1412    Clinical Impression Statement  Pt arrives today with no new complaints, tolerating the new standing work well. We attempted 3 laps of clinic ambulation but she became so out of breath she had to sit, O2 dropped to 88% at that point. She had previously stayed steady around 95%-97%. Pt did improved her standing tolerance today performing more LE movements in a row ( more reps).    Personal Factors and Comorbidities  Age;Past/Current Experience;Comorbidity 1;Comorbidity 2;Comorbidity 3+;Time since onset of injury/illness/exacerbation;Transportation    Comorbidities  cardiac history with bypass surgery 2 years ago, diabetes, HTN, cellulitis, kidney failure, O2 dependent; obesity, asthma; pacemaker    Examination-Activity Limitations  Reach Overhead;Locomotion Level;Stand;Stairs;Lift;Carry    Examination-Participation Restrictions  Church;Meal Prep;Cleaning;Community Activity;Shop    Stability/Clinical Decision Making  Evolving/Moderate complexity    Rehab Potential  Good    PT Frequency  2x / week    PT Duration  8 weeks    PT Treatment/Interventions  ADLs/Self Care Home Management;Gait training;DME Instruction;Functional mobility training;Stair training;Therapeutic activities;Therapeutic exercise;Neuromuscular re-education;Manual techniques;Patient/family education    PT Home Exercise Plan  Access Code: [email or web address]    Consulted and Agree with Plan of Care  Patient       Patient will benefit from skilled  therapeutic intervention in order to improve the following deficits and impairments:  Obesity, Pain,  Decreased strength, Decreased activity tolerance, Difficulty walking, Impaired perceived functional ability  Visit Diagnosis: Muscle weakness (generalized)  Difficulty in walking, not elsewhere classified  Repeated falls     Problem List Patient Active Problem List   Diagnosis Date Noted  . Constipation 10/21/2018  . Leukopenia 05/03/2018  . Thrombocytopenia ([place]) 05/03/2018  . Pneumonia due to human metapneumovirus 05/02/2018  . Chest pain 05/01/2018  . Allergic rhinitis 04/13/2018  . S/P laparoscopic cholecystectomy 04/09/2018  . Chronic respiratory failure with hypoxia and hypercapnia ([place]) 03/07/2018  . Polyp of cecum   . Polyp of transverse colon   . Positive colorectal cancer screening using Cologuard test   . Dysphagia   . Acute on chronic renal failure ([place]) 11/20/2017  . CAD (coronary artery disease) 11/20/2017  . DM2 (diabetes mellitus, type 2) ([place]) 11/20/2017  . Abdominal pain, epigastric 11/20/2017  . Vasomotor rhinitis 10/16/2016  . Right facial numbness 03/05/2016  . CKD (chronic kidney disease), stage III 03/05/2016  . Atrial fibrillation ([place]) 10/24/2015  . Chest pain with moderate risk of acute coronary syndrome 10/23/2015  . History of TIA (transient ischemic attack) 10/22/2015  . Complete heart block ([place]) 06/22/2015  . Allergic drug rash due to anti-infective agent 04/29/2015  . Fatigue 02/14/2015  . Chronic diastolic CHF (congestive heart failure) ([place]) 02/14/2015  . Cellulitis 12/21/2014  . S/P CABG x 5 11/29/2014  . CAD S/P percutaneous coronary angioplasty   . Diarrhea 07/12/2014  . Generalized abdominal pain 07/12/2014  . Diastolic CHF, acute on chronic (HCC) 11/04/2013  . Mixed hypercholesterolemia and hypertriglyceridemia 04/22/2013  . Vertigo 04/22/2013  . Dyspnea on exertion 08/25/2012  . Allergic to IV contrast 07/23/2012  . Unstable angina ([place]) 07/22/2012  . Morbid (severe) obesity due to excess calories ([place]) 11/22/2011  .  Nonischemic cardiomyopathy ([place]) 11/22/2011  . Gastroesophageal reflux disease 11/22/2011  . Back pain 11/22/2011  . OSA (obstructive sleep apnea) 11/22/2011  . HEMORRHOIDS-EXTERNAL 02/21/2010  . NAUSEA 02/21/2010  . ABDOMINAL PAIN -GENERALIZED 02/21/2010  . PERSONAL HX COLONIC POLYPS 02/21/2010  . ANEMIA 04/16/2007  . Essential hypertension 04/16/2007  . DIVERTICULOSIS, COLON 04/16/2007  . ARTHRITIS 04/16/2007  . INTERNAL HEMORRHOIDS WITHOUT MENTION COMP 04/12/2007  . Asthma 04/12/2007  . Cardiac resynchronization therapy pacemaker (CRT-P) in place 04/12/2007  . Gastritis without bleeding 12/14/2006  . COLONIC POLYPS, HYPERPLASTIC 09/27/2002  . FATTY LIVER DISEASE 02/16/2002    Yalena Colon, PTA 12/15/2018, 2:38 PM  [facility] [address], [place], [place], [address] Phone: [phone]   Fax:  [phone]  Name: [name] MRN: [id number] Date of Birth: 1945-10-08

## 2018-12-16 ENCOUNTER — Ambulatory Visit: Payer: Medicare HMO | Admitting: Physical Therapy

## 2018-12-21 ENCOUNTER — Ambulatory Visit: Payer: Medicare HMO | Admitting: Physical Therapy

## 2018-12-21 ENCOUNTER — Other Ambulatory Visit: Payer: Self-pay

## 2018-12-21 ENCOUNTER — Encounter: Payer: Self-pay | Admitting: Physical Therapy

## 2018-12-21 ENCOUNTER — Telehealth: Payer: Self-pay | Admitting: Cardiovascular Disease

## 2018-12-21 DIAGNOSIS — R296 Repeated falls: Secondary | ICD-10-CM

## 2018-12-21 DIAGNOSIS — R262 Difficulty in walking, not elsewhere classified: Secondary | ICD-10-CM | POA: Diagnosis not present

## 2018-12-21 DIAGNOSIS — M6281 Muscle weakness (generalized): Secondary | ICD-10-CM | POA: Diagnosis not present

## 2018-12-21 DIAGNOSIS — R6889 Other general symptoms and signs: Secondary | ICD-10-CM | POA: Diagnosis not present

## 2018-12-21 NOTE — Telephone Encounter (Signed)
Patient notified and verbalized understanding.  Looks like she already has physician pacer check scheduled on Monday, 12/27/18 with Dr. Loletha Grayer in [place] office.

## 2018-12-21 NOTE — Telephone Encounter (Signed)
Suspect sinus tachycardia. Notice it's happening in the mornings. Please ask her to check the HR before using the Symbicort, which will give her a faster heart beat. Also make sure she is drinking enough water early in the day.

## 2018-12-21 NOTE — Telephone Encounter (Signed)
New for patient - going on x 2 weeks off/on.  No chest pain or dizziness.  SOB is not any more than usual.  11/9 - 116 am / 66 pm 11/8 - 110 am / 72 pm 11/7 - 77 am / 66 pm  11/6 - 69 am / 66 pm  11/5 - 110 am / 68 pm  10/29 - 66 am / 64 pm   Stated that she does have a couple things that are stressing her out lately.  Does also c/o of weakness, fatigue which is new for her lately x couple weeks as well.    May not be at home this evening around 4:00 as she has to go to physical therapy.

## 2018-12-21 NOTE — Telephone Encounter (Signed)
New Message     STAT if HR is under 50 or over 120 (normal HR is 60-100 beats per minute)  1) What is your heart rate? HR 125 in 100's over the last 3 days   2) Do you have a log of your heart rate readings (document readings)?  Yes   3) Do you have any other symptoms? she says she is weak all the time

## 2018-12-21 NOTE — Therapy (Addendum)
[facility] [address], [place], [place], [address] Phone: [phone]   Fax:  [phone]  Physical Therapy Treatment  Patient Details  Name: [name] MRN: [id number] Date of Birth: Oct 19, 1945 Referring Provider (PT): Dr. Dorthy Cooler   Encounter Date: 12/21/2018  PT End of Session - 12/21/18 1535    Visit Number  9    Date for PT Re-Evaluation  12/23/18    Authorization Type  [employer]    PT Start Time  2694    PT Stop Time  1614    PT Time Calculation (min)  39 min    Activity Tolerance  Patient tolerated treatment well    Behavior During Therapy  [facility] for tasks assessed/performed       Past Medical History:  Diagnosis Date  . AICD (automatic cardioverter/defibrillator) present   . Anemia   . Arthritis   . Asthma   . CAD (coronary artery disease)    a. 10/09/16 LHC: SVG->LAD patent, SVG->Diag patent, SVG->RCA patent, SVG->LCx occluded. EF 60%, b. 10/31/16 LHC DES to AV groove Circ, DES to intermed branch  . Cellulitis and abscess of foot 12/2014   RT FOOT  . CHF (congestive heart failure) ([place])   . Chronic bronchitis ([place])   . Chronic lower back pain   . Diverticulosis   . Facial numbness 02/2016  . Fatty liver disease, nonalcoholic   . Gastritis   . GERD (gastroesophageal reflux disease)   . Gout   . H/O hiatal hernia   . High cholesterol   . Hyperplastic colon polyp   . Hypertension   . IBS (irritable bowel syndrome)   . Internal hemorrhoids   . Ischemic cardiomyopathy   . Obesity   . On home oxygen therapy    "2L at night" (10/31/2016)  . OSA (obstructive sleep apnea)    "can't tolerate a mask" (10/30/2016)  . Pneumonia    "couple times" (10/31/2016)  . Shortness of breath   . TIA (transient ischemic attack)    "recently" (10/31/2016)  . Type II diabetes mellitus ([place])     Past Surgical History:  Procedure Laterality Date  . ABDOMINAL ULTRASOUND  12/01/2011   Peripelvic cysts- #1-  2.4x1.9x2.3cm, #2-1.2x0.9x1.2cm  . ANKLE FRACTURE SURGERY Right    "had rod put in"  . ANTERIOR CERVICAL DECOMP/DISCECTOMY FUSION    . APPENDECTOMY    . BACK SURGERY    . BIOPSY  12/29/2017   Procedure: BIOPSY;  Surgeon: [name], MD;  Location: [facility];  Service: Endoscopy;;  . BIV ICD INSERTION CRT-D  2001?  [name] BIV PACEMAKER GENERATOR CHANGE OUT N/A 11/02/2012   Procedure: BIV PACEMAKER GENERATOR CHANGE OUT;  Surgeon: Sanda Klein, MD;  Location: [place] CATH LAB;  Service: Cardiovascular;  Laterality: N/A;  . CARDIAC CATHETERIZATION  05/17/1999   No significant coronary obstructive disease w/ mild 20% luminal irregularity of the first diag branch of the LAD  . CARDIAC CATHETERIZATION  07/08/2002   No significant CAD, moderately depressed LV systolic function  . CARDIAC CATHETERIZATION Bilateral 04/26/2007   Normal findings, recommend medical therapy  . CARDIAC CATHETERIZATION  02/18/2008   Moderate CAD, would benefit from having a functional study, recommend continue medical therapy  . CARDIAC CATHETERIZATION  07/23/2012   Medical therapy  . CARDIAC CATHETERIZATION N/A 11/24/2014   Procedure: Left Heart Cath and Coronary Angiography;  Surgeon: Troy Sine, MD;  Location: [place] CV LAB;  Service: Cardiovascular;  Laterality: N/A;  .  CARDIAC CATHETERIZATION  11/27/2014   Procedure: Intravascular Pressure Wire/FFR Study;  Surgeon: Peter M Martinique, MD;  Location: [place] CV LAB;  Service: Cardiovascular;;  . CARDIAC CATHETERIZATION  10/09/2016  . CHOLECYSTECTOMY N/A 04/09/2018   Procedure: LAPAROSCOPIC CHOLECYSTECTOMY;  Surgeon: Greer Pickerel, MD;  Location: [facility] OR[facility];  Service: General;  Laterality: N/A;  . COLONOSCOPY WITH PROPOFOL N/A 12/29/2017   Procedure: COLONOSCOPY WITH PROPOFOL;  Surgeon: [name], MD;  Location: [facility];  Service: Endoscopy;  Laterality: N/A;  . CORONARY ANGIOGRAM  2010  . CORONARY ARTERY BYPASS GRAFT N/A 11/29/2014   Procedure:  CORONARY ARTERY BYPASS GRAFTING x 5 (LIMA-LAD, SVG-D, SVG-OM1-OM2, SVG-PD);  Surgeon: Melrose Nakayama, MD;  Location: [place];  Service: Open Heart Surgery;  Laterality: N/A;  . CORONARY STENT INTERVENTION N/A 10/31/2016   Procedure: CORONARY STENT INTERVENTION;  Surgeon: Burnell Blanks, MD;  Location: [place] CV LAB;  Service: Cardiovascular;  Laterality: N/A;  . ESOPHAGOGASTRODUODENOSCOPY (EGD) WITH PROPOFOL N/A 12/29/2017   Procedure: ESOPHAGOGASTRODUODENOSCOPY (EGD) WITH PROPOFOL;  Surgeon: [name], MD;  [facility];  Service: Endoscopy;  Laterality: N/A;  . FRACTURE SURGERY    . INSERT / REPLACE / [place]  . KNEE ARTHROSCOPY Bilateral   . LEFT HEART CATH AND CORS/GRAFTS ANGIOGRAPHY N/A 12/09/2017   Procedure: LEFT HEART CATH AND CORS/GRAFTS ANGIOGRAPHY;  Surgeon: Troy Sine, MD;  Location: [place] CV LAB;  Service: Cardiovascular;  Laterality: N/A;  . LEFT HEART CATHETERIZATION WITH CORONARY ANGIOGRAM N/A 07/23/2012   Procedure: LEFT HEART CATHETERIZATION WITH CORONARY ANGIOGRAM;  Surgeon: Leonie Man, MD;  Location: [facility] CATH LAB;  Service: Cardiovascular;  Laterality: N/A;  Carlton Adam MYOVIEW  11/14/2011   Mild-moderate defect seen in Mid Inferolateral and Mid Anterolateral regions-consistant w/ infarct/scar. No significant ischemia demonstrated.  [name] POLYPECTOMY  12/29/2017   Procedure: POLYPECTOMY;  Surgeon: [name], MD;  Location: [facility];  Service: Endoscopy;;  . RIGHT/LEFT HEART CATH AND CORONARY ANGIOGRAPHY N/A 10/09/2016   Procedure: RIGHT/LEFT HEART CATH AND CORONARY ANGIOGRAPHY;  Surgeon: Jolaine Artist, MD;  Location: [place] CV LAB;  Service: Cardiovascular;  Laterality: N/A;  . TEE WITHOUT CARDIOVERSION N/A 11/29/2014   Procedure: TRANSESOPHAGEAL ECHOCARDIOGRAM (TEE);  Surgeon: Melrose Nakayama, MD;  Location: [place];  Service: Open Heart Surgery;  Laterality: N/A;  . TRANSTHORACIC  ECHOCARDIOGRAM  07/23/2012   EF 55-60%, normal-mild  . TUBAL LIGATION      There were no vitals filed for this visit.  Subjective Assessment - 12/21/18 1536    Subjective  My heart rate was up a lot this morning. O2 92% pre-session.  HR 70bpm.    Pertinent History  3L O2 dependent;  chronic LBP; diabetes;  CABG x5;  left ankle fracture; pacemaker    Patient Stated Goals  be able to walk and not be afraid to fall;  go to church by myself    Currently in Pain?  No/denies         [facility] PT Assessment - 12/21/18 0001      Strength   Right Hip Flexion  4+/5    Right Hip Extension  4+/5    Right Hip ABduction  4/5    Right Ankle Dorsiflexion  4+/5    Right Ankle Plantar Flexion  4+/5    Left Ankle Dorsiflexion  4+/5    Left Ankle Plantar Flexion  4+/5      Berg Balance Test   Sit to Stand  Able to stand without using hands and stabilize independently    Standing Unsupported  Able to stand safely 2 minutes    Sitting with Back Unsupported but Feet Supported on Floor or Stool  Able to sit safely and securely 2 minutes    Stand to Sit  Sits safely with minimal use of hands    Transfers  Able to transfer safely, minor use of hands    Standing Unsupported with Eyes Closed  Able to stand 10 seconds safely    Standing Unsupported with Feet Together  Able to place feet together independently and stand 1 minute safely    From Standing, Reach Forward with Outstretched Arm  Can reach forward >12 cm safely (5")    From Standing Position, Pick up Object from Floor  Able to pick up shoe safely and easily    From Standing Position, Turn to Look Behind Over each Shoulder  Looks behind from both sides and weight shifts well    Turn 360 Degrees  Able to turn 360 degrees safely in 4 seconds or less    Standing Unsupported, Alternately Place Feet on Step/Stool  Able to stand independently and safely and complete 8 steps in 20 seconds    Standing Unsupported, One Foot in Front  Able to take small step  independently and hold 30 seconds    Standing on One Leg  Tries to lift leg/unable to hold 3 seconds but remains standing independently    Total Score  50                   [facility] Adult PT Treatment/Exercise - 12/21/18 0001      Lumbar Exercises: Stretches   Active Hamstring Stretch  Right;Left;2 reps;20 seconds      Knee/Hip Exercises: Standing   Heel Raises  Both;20 reps    Knee Flexion  AROM;Strengthening;Both;1 set;10 reps    Hip Flexion Limitations  marching toe taps on 6" step with bil handrails x 20 alt, O2 Sat dropped to 89%      Knee/Hip Exercises: Seated   Long Arc Quad  Strengthening;Both;1 set;Weights;20 reps    Long Arc Quad Weight  5 lbs.    Ball Squeeze  20x    Clamshell with TheraBand  Red   20x VC to slow speed/use her msucles   Marching  Strengthening;Both;1 set;15 reps;Weights    Marching Weights  5 lbs.    Sit to Sand  5 reps;without UE support   14 sec              PT Short Term Goals - 12/06/18 1423      PT SHORT TERM GOAL #2   Title  The patient will be able to ambulate 160 feet with RW with supervision    Baseline  160' without AD, O2 sat remained above 95%    Status  Achieved        PT Long Term Goals - 12/21/18 1543      PT LONG TERM GOAL #1   Title  The patient will be independent with safe self progression of HEP    Status  On-going      PT LONG TERM GOAL #2   Title  The patient will be able to ambulate 250 feet needed to attend church    Baseline  church not open due to Covid exposure    Status  On-going      PT LONG TERM GOAL #3   Title  Improved LE  strength to grossly 4/5 to 4+/5 as indicated by increased 5x sit to stand speed to 15 sec    Baseline  14 sec and LE 4/5 or more throughout    Status  Achieved      PT LONG TERM GOAL #4   Title  BERG balance score improved to 46/56 indicating improved gait safety and decreased risk of falls    Baseline  50/56 (12/21/2018)    Status  Achieved      PT LONG TERM GOAL  #5   Title  Activities Based Confidence Scale improved to 59%    Status  On-going            Plan - 12/21/18 1618    Clinical Impression Statement  Pt did well today.  She has been able to steadily increase activity level. She has met several goals as stated above including sit to stand and Berg balance goals.  Pt will benefit from skilled PT to contiue working on walking and endurnace and understanding of HEP so she can continue to progress to maximum function at home.    PT Treatment/Interventions  ADLs/Self Care Home Management;Gait training;DME Instruction;Functional mobility training;Stair training;Therapeutic activities;Therapeutic exercise;Neuromuscular re-education;Manual techniques;Patient/family education    PT Next Visit Plan  walking for goals assessed, monitor O2, HEP    PT Home Exercise Plan  Access Code: [email or web address]    Consulted and Agree with Plan of Care  Patient       Patient will benefit from skilled therapeutic intervention in order to improve the following deficits and impairments:  Obesity, Pain, Decreased strength, Decreased activity tolerance, Difficulty walking, Impaired perceived functional ability  Visit Diagnosis: Muscle weakness (generalized)  Difficulty in walking, not elsewhere classified  Repeated falls     Problem List Patient Active Problem List   Diagnosis Date Noted  . Constipation 10/21/2018  . Leukopenia 05/03/2018  . Thrombocytopenia ([place]) 05/03/2018  . Pneumonia due to human metapneumovirus 05/02/2018  . Chest pain 05/01/2018  . Allergic rhinitis 04/13/2018  . S/P laparoscopic cholecystectomy 04/09/2018  . Chronic respiratory failure with hypoxia and hypercapnia ([place]) 03/07/2018  . Polyp of cecum   . Polyp of transverse colon   . Positive colorectal cancer screening using Cologuard test   . Dysphagia   . Acute on chronic renal failure ([place]) 11/20/2017  . CAD (coronary artery disease) 11/20/2017  . DM2 (diabetes mellitus, type 2)  ([place]) 11/20/2017  . Abdominal pain, epigastric 11/20/2017  . Vasomotor rhinitis 10/16/2016  . Right facial numbness 03/05/2016  . CKD (chronic kidney disease), stage III 03/05/2016  . Atrial fibrillation ([place]) 10/24/2015  . Chest pain with moderate risk of acute coronary syndrome 10/23/2015  . History of TIA (transient ischemic attack) 10/22/2015  . Complete heart block ([place]) 06/22/2015  . Allergic drug rash due to anti-infective agent 04/29/2015  . Fatigue 02/14/2015  . Chronic diastolic CHF (congestive heart failure) ([place]) 02/14/2015  . Cellulitis 12/21/2014  . S/P CABG x 5 11/29/2014  . CAD S/P percutaneous coronary angioplasty   . Diarrhea 07/12/2014  . Generalized abdominal pain 07/12/2014  . Diastolic CHF, acute on chronic (HCC) 11/04/2013  . Mixed hypercholesterolemia and hypertriglyceridemia 04/22/2013  . Vertigo 04/22/2013  . Dyspnea on exertion 08/25/2012  . Allergic to IV contrast 07/23/2012  . Unstable angina ([place]) 07/22/2012  . Morbid (severe) obesity due to excess calories ([place]) 11/22/2011  . Nonischemic cardiomyopathy ([place]) 11/22/2011  . Gastroesophageal reflux disease 11/22/2011  . Back pain 11/22/2011  . OSA (obstructive sleep apnea)  11/22/2011  . HEMORRHOIDS-EXTERNAL 02/21/2010  . NAUSEA 02/21/2010  . ABDOMINAL PAIN -GENERALIZED 02/21/2010  . PERSONAL HX COLONIC POLYPS 02/21/2010  . ANEMIA 04/16/2007  . Essential hypertension 04/16/2007  . DIVERTICULOSIS, COLON 04/16/2007  . ARTHRITIS 04/16/2007  . INTERNAL HEMORRHOIDS WITHOUT MENTION COMP 04/12/2007  . Asthma 04/12/2007  . Cardiac resynchronization therapy pacemaker (CRT-P) in place 04/12/2007  . Gastritis without bleeding 12/14/2006  . COLONIC POLYPS, HYPERPLASTIC 09/27/2002  . FATTY LIVER DISEASE 02/16/2002    Jule Ser, PT 12/21/2018, 4:20 PM  [facility] [address] [place], [place], [address] Phone: [phone]   Fax:   [phone]  Name: DEJANA PUGSLEY MRN: [id number] Date of Birth: 08-11-1945  PHYSICAL THERAPY DISCHARGE SUMMARY  Visits from Start of Care: 9  Current functional level related to goals / functional outcomes: See above details   Remaining deficits: See above details   Education / Equipment: HEP Plan: Patient agrees to discharge.  Patient goals were not met. Patient is being discharged due to not returning since the last visit.  ?????    [employer], PT 04/11/19 9:27 AM

## 2018-12-23 ENCOUNTER — Ambulatory Visit: Payer: Medicare HMO | Admitting: Physical Therapy

## 2018-12-27 ENCOUNTER — Encounter: Payer: Self-pay | Admitting: Cardiovascular Disease

## 2018-12-27 ENCOUNTER — Ambulatory Visit (INDEPENDENT_AMBULATORY_CARE_PROVIDER_SITE_OTHER): Payer: Medicare HMO | Admitting: Cardiovascular Disease

## 2018-12-27 ENCOUNTER — Other Ambulatory Visit: Payer: Self-pay

## 2018-12-27 VITALS — BP 96/76 | HR 75 | Temp 97.1°F | Ht 63.0 in | Wt 255.0 lb

## 2018-12-27 DIAGNOSIS — I5032 Chronic diastolic (congestive) heart failure: Secondary | ICD-10-CM | POA: Diagnosis not present

## 2018-12-27 DIAGNOSIS — Z45018 Encounter for adjustment and management of other part of cardiac pacemaker: Secondary | ICD-10-CM | POA: Diagnosis not present

## 2018-12-27 DIAGNOSIS — G4733 Obstructive sleep apnea (adult) (pediatric): Secondary | ICD-10-CM | POA: Diagnosis not present

## 2018-12-27 DIAGNOSIS — N1832 Chronic kidney disease, stage 3b: Secondary | ICD-10-CM | POA: Diagnosis not present

## 2018-12-27 DIAGNOSIS — E782 Mixed hyperlipidemia: Secondary | ICD-10-CM | POA: Diagnosis not present

## 2018-12-27 DIAGNOSIS — I442 Atrioventricular block, complete: Secondary | ICD-10-CM

## 2018-12-27 DIAGNOSIS — I2510 Atherosclerotic heart disease of native coronary artery without angina pectoris: Secondary | ICD-10-CM | POA: Diagnosis not present

## 2018-12-27 DIAGNOSIS — E119 Type 2 diabetes mellitus without complications: Secondary | ICD-10-CM | POA: Diagnosis not present

## 2018-12-27 DIAGNOSIS — R6889 Other general symptoms and signs: Secondary | ICD-10-CM | POA: Diagnosis not present

## 2018-12-27 NOTE — Progress Notes (Signed)
Patient ID: [name], female   DOB: 1946-01-16, 73 y.o.   MRN: [id number] .    Cardiology Office Note    Date:  12/27/2018   ID:  [name], DOB 1945-08-20, MRN [id number]  PCP:  [facility]  Cardiologist:   Sanda Klein, MD   Chief Complaint  Patient presents with  . Shortness of Breath    History of Present Illness:  [name] is a 73 y.o. female with CAD s/p CABG October 2016 (LIMA to LAD, SVG to D1, SVG to OM1 and OM2, SVG to PDA), diastolic heart failure, morbid obesity, adrenal insufficiency, type 2 diabetes mellitus, Hypertension, stage III chronic kidney disease, gout, obstructive sleep apnea, asthma. Nonischemic cardiomyopathy and heart failure preceded the diagnosis of coronary disease. She has complete heart block and had a conventional pacemaker implanted in 2001. This was upgraded to a biventricular ICD implanted in 2008 for severe cardiomyopathy with ejection fraction 20%, downgraded to CRT-P in 2014 ([place]) following excellent response to resynchronization (hyper-responder, normalization of left ventricular systolic function). The current atrial and right ventricular leads were implanted in 2001, the left ventricular lead was implanted in 2008. The defibrillator lead implanted in 2008 is capped and its tip was resected at the time of bypass surgery. Ever since bypass surgery, atrial pacing thresholds have been extremely high, so her device is programmed VDD. Most recent echo shows ejection fraction normal at 55-60% with grade 1 diastolic dysfunction (6834).  She is wearing oxygen 3 L/min by nasal cannula 24 hours a day.  She has NYHA functional class 2-3 chronic dyspnea.  She is getting physical therapy since her legs are very weak.  She often notices that her heart rate is fast in the morning.  She sometimes has to stop physical activity due to presyncope, but does not have angina pectoris.  Edema is controlled with  diuretics and compression hose.  She is not wearing CPAP (she could never tolerate the mask).  Although she cannot lie flat on her back due to dyspnea, she can lie horizontally on her right side.  Her blood pressure is a little low today, but at home she is keeping meticulous records and her blood pressure is 127/70-152/79.  Pacemaker interrogation shows device function.  er [employer] CRT-P device was implanted in 2014 and is programmed VDD due to extremely high atrial pacing thresholds (ever since she had bypass surgery).  Current generator longevity is estimated at 5 years.  She has 100% biventricular pacing.  LV lead is programmed LV tip-LV ring.  This a last device check she has a single episode of 13 beat nonsustained VT on December 04, 2018, asymptomatic.  She has very rare episodes of paroxysmal atrial tachycardia and there has been no atrial fibrillation.   In the past we have estimated her dry weight to be around 248 pounds.  She was clearly hypovolemic at 238 with hypotension and worsening renal function.  Today she weighs 255 pounds and does not have any edema (wearing compression stockings).   Past Medical History:  Diagnosis Date  . AICD (automatic cardioverter/defibrillator) present   . Anemia   . Arthritis   . Asthma   . CAD (coronary artery disease)    a. 10/09/16 LHC: SVG->LAD patent, SVG->Diag patent, SVG->RCA patent, SVG->LCx occluded. EF 60%, b. 10/31/16 LHC DES to AV groove Circ, DES to intermed branch  . Cellulitis and abscess of foot 12/2014   RT FOOT  .  CHF (congestive heart failure) ([place])   . Chronic bronchitis ([place])   . Chronic lower back pain   . Diverticulosis   . Facial numbness 02/2016  . Fatty liver disease, nonalcoholic   . Gastritis   . GERD (gastroesophageal reflux disease)   . Gout   . H/O hiatal hernia   . High cholesterol   . Hyperplastic colon polyp   . Hypertension   . IBS (irritable bowel syndrome)   . Internal hemorrhoids   .  Ischemic cardiomyopathy   . Obesity   . On home oxygen therapy    "2L at night" (10/31/2016)  . OSA (obstructive sleep apnea)    "can't tolerate a mask" (10/30/2016)  . Pneumonia    "couple times" (10/31/2016)  . Shortness of breath   . TIA (transient ischemic attack)    "recently" (10/31/2016)  . Type II diabetes mellitus ([place])     Past Surgical History:  Procedure Laterality Date  . ABDOMINAL ULTRASOUND  12/01/2011   Peripelvic cysts- #1- 2.4x1.9x2.3cm, #2-1.2x0.9x1.2cm  . ANKLE FRACTURE SURGERY Right    "had rod put in"  . ANTERIOR CERVICAL DECOMP/DISCECTOMY FUSION    . APPENDECTOMY    . BACK SURGERY    . BIOPSY  12/29/2017   Procedure: BIOPSY;  Surgeon: [name], MD;  [facility]: [facility];  Service: Endoscopy;;  . BIV ICD INSERTION CRT-D  2001?  [name] BIV PACEMAKER GENERATOR CHANGE OUT N/A 11/02/2012   Procedure: BIV PACEMAKER GENERATOR CHANGE OUT;  Surgeon: Sanda Klein, MD;  Location: [facility];  Service: Cardiovascular;  Laterality: N/A;  . CARDIAC CATHETERIZATION  05/17/1999   No significant coronary obstructive disease w/ mild 20% luminal irregularity of the first diag branch of the LAD  . CARDIAC CATHETERIZATION  07/08/2002   No significant CAD, moderately depressed LV systolic function  . CARDIAC CATHETERIZATION Bilateral 04/26/2007   Normal findings, recommend medical therapy  . CARDIAC CATHETERIZATION  02/18/2008   Moderate CAD, would benefit from having a functional study, recommend continue medical therapy  . CARDIAC CATHETERIZATION  07/23/2012   Medical therapy  . CARDIAC CATHETERIZATION N/A 11/24/2014   Procedure: Left Heart Cath and Coronary Angiography;  Surgeon: Troy Sine, MD;  Location: [facility];  Service: Cardiovascular;  Laterality: N/A;  . CARDIAC CATHETERIZATION  11/27/2014   Procedure: Intravascular Pressure Wire/FFR Study;  Surgeon: Peter M Martinique, MD;  Location: [facility];  Service: Cardiovascular;;  . CARDIAC  CATHETERIZATION  10/09/2016  . CHOLECYSTECTOMY N/A 04/09/2018   Procedure: LAPAROSCOPIC CHOLECYSTECTOMY;  Surgeon: Greer Pickerel, MD;  [facility];  Service: General;  Laterality: N/A;  . COLONOSCOPY WITH PROPOFOL N/A 12/29/2017   Procedure: COLONOSCOPY WITH PROPOFOL;  Surgeon: [name], MD;  [facility];  Service: Endoscopy;  Laterality: N/A;  . CORONARY ANGIOGRAM  2010  . CORONARY ARTERY BYPASS GRAFT N/A 11/29/2014   Procedure: CORONARY ARTERY BYPASS GRAFTING x 5 (LIMA-LAD, SVG-D, SVG-OM1-OM2, SVG-PD);  Surgeon: Melrose Nakayama, MD;  Location: [facility];  Service: Open Heart Surgery;  Laterality: N/A;  . CORONARY STENT INTERVENTION N/A 10/31/2016   Procedure: CORONARY STENT INTERVENTION;  Surgeon: Burnell Blanks, MD;  Location: [facility];  Service: Cardiovascular;  Laterality: N/A;  . ESOPHAGOGASTRODUODENOSCOPY (EGD) WITH PROPOFOL N/A 12/29/2017   Procedure: ESOPHAGOGASTRODUODENOSCOPY (EGD) WITH PROPOFOL;  Surgeon: [name], MD;  [facility];  Service: Endoscopy;  Laterality: N/A;  . FRACTURE SURGERY    . INSERT / REPLACE / REMOVE  PACEMAKER  1999  . KNEE ARTHROSCOPY Bilateral   . LEFT HEART CATH AND CORS/GRAFTS ANGIOGRAPHY N/A 12/09/2017   Procedure: LEFT HEART CATH AND CORS/GRAFTS ANGIOGRAPHY;  Surgeon: Troy Sine, MD;  Location: [facility];  Service: Cardiovascular;  Laterality: N/A;  . LEFT HEART CATHETERIZATION WITH CORONARY ANGIOGRAM N/A 07/23/2012   Procedure: LEFT HEART CATHETERIZATION WITH CORONARY ANGIOGRAM;  Surgeon: Leonie Man, MD;  Location: [facility];  Service: Cardiovascular;  Laterality: N/A;  [facility] MYOVIEW  11/14/2011   Mild-moderate defect seen in Mid Inferolateral and Mid Anterolateral regions-consistant w/ infarct/scar. No significant ischemia demonstrated.  [name] POLYPECTOMY  12/29/2017   Procedure: POLYPECTOMY;  Surgeon: [name], MD;  [facility];  Service:  Endoscopy;;  . RIGHT/LEFT HEART CATH AND CORONARY ANGIOGRAPHY N/A 10/09/2016   Procedure: RIGHT/LEFT HEART CATH AND CORONARY ANGIOGRAPHY;  Surgeon: Jolaine Artist, MD;  Location: [facility];  Service: Cardiovascular;  Laterality: N/A;  . TEE WITHOUT CARDIOVERSION N/A 11/29/2014   Procedure: TRANSESOPHAGEAL ECHOCARDIOGRAM (TEE);  Surgeon: Melrose Nakayama, MD;  Location: [facility];  Service: Open Heart Surgery;  Laterality: N/A;  . TRANSTHORACIC ECHOCARDIOGRAM  07/23/2012   EF 55-60%, normal-mild  . TUBAL LIGATION      Current Medications: Outpatient Medications Prior to Visit  Medication Sig Dispense Refill  . acetaminophen (TYLENOL) 500 MG tablet Take 1,000 mg by mouth every 4 (four) hours as needed for headache (pain).     [name] albuterol (PROVENTIL HFA;VENTOLIN HFA) 108 (90 Base) MCG/ACT inhaler Inhale 2 puffs into the lungs every 6 (six) hours as needed for wheezing or shortness of breath. 1 Inhaler 0  . albuterol (PROVENTIL) (2.5 MG/3ML) 0.083% nebulizer solution Take 3 mLs (2.5 mg total) by nebulization every 6 (six) hours as needed for wheezing or shortness of breath. 75 mL 0  . aspirin EC 81 MG tablet Take 1 tablet (81 mg total) by mouth daily. 90 tablet 3  . bisoprolol (ZEBETA) 5 MG tablet TAKE 1 TABLET EVERY DAY (Patient taking differently: Take 5 mg by mouth daily. ) 90 tablet 2  . budesonide-formoterol (SYMBICORT) 160-4.5 MCG/ACT inhaler Inhale 2 puffs into the lungs 2 (two) times daily.    . cetirizine (ZYRTEC) 10 MG tablet Take 10 mg by mouth daily.     . clopidogrel (PLAVIX) 75 MG tablet Take 1 tablet (75 mg total) by mouth daily. (Patient taking differently: Take 75 mg by mouth at bedtime. ) 90 tablet 3  . clotrimazole (LOTRIMIN) 1 % cream Apply 1 application topically 2 (two) times daily as needed (rash).    . clotrimazole (MYCELEX) 10 MG troche Take 10 mg by mouth daily as needed (Sore Mouth).     . colchicine 0.6 MG tablet Take 0.6 mg by mouth 2 (two) times daily as  needed (gout flare).     . cycloSPORINE (RESTASIS) 0.05 % ophthalmic emulsion Place 1 drop into both eyes 2 (two) times daily.    . diclofenac sodium (VOLTAREN) 1 % GEL Apply 2 g topically 4 (four) times daily. (Patient taking differently: Apply 2 g topically 4 (four) times daily as needed (pain). ) 1 Tube 0  . diphenhydrAMINE (BENADRYL) 25 MG tablet Take 25 mg by mouth every 6 (six) hours as needed for allergies.    [name] EPINEPHrine (EPIPEN 2-PAK) 0.3 mg/0.3 mL IJ SOAJ injection Inject 0.3 mg into the muscle once as needed (severe allergic reaction).    . febuxostat (ULORIC) 40 MG tablet Take 40 mg  by mouth daily.  3  . GLUCOSAMINE-CHONDROITIN PO Take 1 tablet by mouth daily.    [name] guaiFENesin (MUCINEX) 600 MG 12 hr tablet Take 600 mg by mouth at bedtime as needed for cough or to loosen phlegm.     . hydrocortisone 2.5 % cream Apply 1 application topically daily as needed (itching).   1  . hydroxypropyl methylcellulose / hypromellose (ISOPTO TEARS / GONIOVISC) 2.5 % ophthalmic solution Place 1 drop into both eyes 3 (three) times daily.     [name] ipratropium (ATROVENT) 0.02 % nebulizer solution Take 2.5 mLs (0.5 mg total) by nebulization 4 (four) times daily. 75 mL 0  . isosorbide mononitrate (IMDUR) 30 MG 24 hr tablet Take 1 tablet (30 mg total) by mouth daily.    [name] levofloxacin (LEVAQUIN) 500 MG tablet Take 500 mg by mouth daily.    . metolazone (ZAROXOLYN) 5 MG tablet Take 1 tablet (5 mg total) by mouth daily as needed (for weight gain of 3 LBS. or more). 30 tablet 2  . Multiple Vitamin (MULTIVITAMIN WITH MINERALS) TABS tablet Take 1 tablet by mouth daily.    . ondansetron (ZOFRAN ODT) 4 MG disintegrating tablet Take 1 tablet (4 mg total) by mouth every 8 (eight) hours as needed for nausea or vomiting. 20 tablet 0  . OXYGEN Place 3 L into the nose continuous.     . polyethylene glycol (MIRALAX / GLYCOLAX) 17 g packet Take 17 g by mouth daily.    . potassium chloride SA (K-DUR,KLOR-CON) 20 MEQ tablet  Take 1 tablet (20 mEq total) by mouth daily. 90 tablet 3  . rosuvastatin (CRESTOR) 20 MG tablet TAKE 1 TABLET AT BEDTIME 90 tablet 1  . saxagliptin HCl (ONGLYZA) 2.5 MG TABS tablet Take 2.5 mg by mouth daily.     [name] spironolactone (ALDACTONE) 25 MG tablet Take 0.5 tablets (12.5 mg total) by mouth at bedtime.    . torsemide (DEMADEX) 20 MG tablet Take 60 mg one day and alternating with 40 mg the next. 90 tablet 3  . triamcinolone cream (KENALOG) 0.1 % Apply 1 application topically 2 (two) times daily as needed (itching).     . Vitamin D, Ergocalciferol, (DRISDOL) 1.25 MG (50000 UT) CAPS capsule Take 1 capsule (50,000 Units total) by mouth every 7 (seven) days. (Patient taking differently: Take 50,000 Units by mouth every Thursday. ) 12 capsule 0  . nitroGLYCERIN (NITROSTAT) 0.4 MG SL tablet Place 1 tablet (0.4 mg total) under the tongue every 5 (five) minutes as needed for chest pain. 25 tablet 3  . pantoprazole (PROTONIX) 40 MG tablet Take 1 tablet (40 mg total) by mouth 2 (two) times daily. (Patient not taking: Reported on 12/27/2018) 90 tablet 1  . pantoprazole (PROTONIX) 40 MG tablet Take 1 tablet (40 mg total) by mouth daily. (Patient not taking: Reported on 12/27/2018) 90 tablet 3  . pantoprazole (PROTONIX) 40 MG tablet TAKE 1 TABLET TWICE DAILY (Patient not taking: Reported on 12/27/2018) 180 tablet 1   No facility-administered medications prior to visit.      Allergies:   Ivp dye [iodinated diagnostic agents], Shellfish allergy, Shellfish-derived products, Sulfa antibiotics, Iodine, Atorvastatin, Doxycycline, Cephalexin, and Zithromax [azithromycin]   Social History   Socioeconomic History  . Marital status: Widowed    Spouse name: Not on file  . Number of children: 5  . Years of education: Not on file  . Highest education level: Not on file  Occupational History  . Occupation: STAFF/BUFFET    Employer:  [employer]  Social Needs  . Financial resource strain: Very hard   . Food insecurity    Worry: Often true    Inability: Often true  . Transportation needs    Medical: Not on file    Non-medical: Not on file  Tobacco Use  . Smoking status: Former Smoker    Packs/day: 0.25    Years: 3.00    Pack years: 0.75    Types: Cigarettes    Quit date: 02/11/1968    Years since quitting: 50.9  . Smokeless tobacco: Never Used  Substance and Sexual Activity  . Alcohol use: No  . Drug use: No  . Sexual activity: Never  Lifestyle  . Physical activity    Days per week: 0 days    Minutes per session: 0 min  . Stress: Very much  Relationships  . Social Herbalist on phone: Not on file    Gets together: Not on file    Attends religious service: Not on file    Active member of club or organization: Not on file    Attends meetings of clubs or organizations: Not on file    Relationship status: Not on file  Other Topics Concern  . Not on file  Social History Narrative   Widowed last year. She lives with her two sons.     Family History:  The patient's family history includes Asthma in her sister; Breast cancer in her mother; Diabetes in her maternal grandmother and mother; Glaucoma in her maternal aunt; Heart disease in her maternal aunt and maternal grandmother; Kidney disease in her maternal grandmother.   ROS:   Please see the history of present illness.    ROS All other systems are reviewed and are negative.   PHYSICAL EXAM:   VS:  BP 96/76   Pulse 75   Temp (!) 97.1 F (36.2 C)   Ht 5\' 3"  (1.6 m)   Wt 255 lb (115.7 kg)   SpO2 98%   BMI 45.17 kg/m      General: Alert, oriented x3, no distress, she is morbidly obese, which makes her physical exam very challenging. Head: no evidence of trauma, PERRL, EOMI, no exophtalmos or lid lag, no myxedema, no xanthelasma; normal ears, nose and oropharynx Neck: normal jugular venous pulsations and no hepatojugular reflux; brisk carotid pulses without delay and no carotid bruits Chest: clear to  auscultation, no signs of consolidation by percussion or palpation, normal fremitus, symmetrical and full respiratory excursions Cardiovascular: normal position and quality of the apical impulse, regular rhythm, normal first and second heart sounds, no murmurs, rubs or gallops Abdomen: no tenderness or distention, no masses by palpation, no abnormal pulsatility or arterial bruits, normal bowel sounds, no hepatosplenomegaly Extremities: no clubbing, cyanosis or edema; 2+ radial, ulnar and brachial pulses bilaterally; 2+ right femoral, posterior tibial and dorsalis pedis pulses; 2+ left femoral, posterior tibial and dorsalis pedis pulses; no subclavian or femoral bruits Neurological: grossly nonfocal Psych: Normal mood and affect    Wt Readings from Last 3 Encounters:  12/27/18 255 lb (115.7 kg)  10/10/18 250 lb (113.4 kg)  10/07/18 252 lb (114.3 kg)      Studies/Labs Reviewed:   EKG:  EKG is not ordered today.  The tracing from 10/11/2018 shows atrial) sinus), ventricular paced rhythm with effective LV capture (positive R in V1)  Recent Labs: 05/01/2018: ALT 35; B Natriuretic Peptide 142.7 10/10/2018: BUN 33; Creatinine, Ser 1.44; Hemoglobin 11.7; Platelets 137; Potassium 4.7;  Sodium 138   Lipid Panel    Component Value Date/Time   CHOL 166 10/31/2016 0314   TRIG 181 (H) 10/31/2016 0314   HDL 39 (L) 10/31/2016 0314   CHOLHDL 4.3 10/31/2016 0314   VLDL 36 10/31/2016 0314   LDLCALC 91 10/31/2016 0314   08/18/2018 Total cholesterol 193, triglycerides 187, HDL 52, LDL 103 Hemoglobin A1c 6.9% 11/25/2018  creatinine 2.13  ASSESSMENT:    1. Chronic diastolic (congestive) heart failure ([place])   2. Coronary artery disease involving native coronary artery of native heart without angina pectoris   3. Heart block AV complete ([place])   4. Biventricular pacemaker check   5. OSA (obstructive sleep apnea)   6. Morbid obesity ([place])   7. Mixed hypercholesterolemia and hypertriglyceridemia    8. Controlled type 2 diabetes mellitus without complication, without long-term current use of insulin (H[facility])   9. Stage 3b chronic kidney disease      PLAN:  In order of problems listed above:  1. CHF: He is now wearing oxygen 24 hours a day.  Her physical exam makes any assessment of volume status very challenging.  Always been difficult to distinguish dyspnea from heart disease versus reactive airway disease.  Repeat echocardiogram.  Continue the current dose of diuretics 2. CAD s/p CABG: asymptomatic/no angina.  On lifelong dual antiplatelet therapy.  Receiving statin.  On highly selective beta-blocker due to reactive airway disease. 3. CHB: Pacemaker dependent with 100% biventricular pacing 4. CRT-P: Excellent device function with the exception of very high flow pacing thresholds (she does not require atrial pacing).  Device is programmed VDD. The current right atrial lead and right ventricular lead were placed in 2001. The current left ventricular lead was placed in 2008. Consider replacement of the right atrial lead at the time of her next generator change out.   5. OSA: Intolerant of CPAP; pulmonary hypertension due to sleep apnea and obesity hypoventilation syndrome are likely contributing to a large degree to her shortness of breath.  She also has significant reactive airway disease and has periodically required steroid therapy. 6. Morbid obesity: Makes diagnosis and management of her cardiac problems more difficult. 7. HLP: Most recent LDL cholesterol 103.  On highly active statin.  Her medication list spans 3 whole pages and I do not think adding ezetimibe is reasonable.  Similarly, I do not think she requires independent treatment for mild hypertriglyceridemia. 8. DM: Fair, albeit borderline control hemoglobin A1c 6.9%. 9. CKD 3: Baseline creatinine around 1.4 (GFR 40-45).  Recent marked worsening of creatinine.  Avoid excessive diuresis.  Etiology of her dyspnea is multifactorial,  including morbid obesity and restrictive physiology, asthma, pulmonary hypertension due to obesity and obstructive sleep apnea as well as heart failure    Medication Adjustments/Labs and Tests Ordered: Current medicines are reviewed at length with the patient today.  Concerns regarding medicines are outlined above.  Medication changes, Labs and Tests ordered today are listed in the Patient Instructions below. Patient Instructions  Medication Instructions:  No changes *If you need a refill on your cardiac medications before your next appointment, please call your pharmacy*  Lab Work: None ordered If you have labs (blood work) drawn today and your tests are completely normal, you will receive your results only by: [name] MyChart Message (if you have MyChart) OR . A paper copy in the mail If you have any lab test that is abnormal or we need to change your treatment, we will call you to review the results.  Testing/Procedures: Your physician has requested that you have an echocardiogram. Echocardiography is a painless test that uses sound waves to create images of your heart. It provides your doctor with information about the size and shape of your heart and how well your heart's chambers and valves are working. You may receive an ultrasound enhancing agent through an IV if needed to better visualize your heart during the echo.This procedure takes approximately one hour. There are no restrictions for this procedure. This will take place at the [address], [address].    Follow-Up: At [facility], you and your health needs are our priority.  As part of our continuing mission to provide you with exceptional heart care, we have created designated Provider Care Teams.  These Care Teams include your primary Cardiologist (physician) and Advanced Practice Providers (APPs -  Physician Assistants and Nurse Practitioners) who all work together to provide you with the care you need, when you need it.   Your next appointment:   6 months  The format for your next appointment:   In Person  Provider:   Sanda Klein, MD      Signed, Sanda Klein, MD  12/27/2018 8:24 PM    [facility] [address], [place], [place]  [address] Phone: [phone]; Fax: [phone]

## 2018-12-27 NOTE — Patient Instructions (Signed)
Medication Instructions:  No changes *If you need a refill on your cardiac medications before your next appointment, please call your pharmacy*  Lab Work: None ordered If you have labs (blood work) drawn today and your tests are completely normal, you will receive your results only by: [name] MyChart Message (if you have MyChart) OR . A paper copy in the mail If you have any lab test that is abnormal or we need to change your treatment, we will call you to review the results.  Testing/Procedures: Your physician has requested that you have an echocardiogram. Echocardiography is a painless test that uses sound waves to create images of your heart. It provides your doctor with information about the size and shape of your heart and how well your heart's chambers and valves are working. You may receive an ultrasound enhancing agent through an IV if needed to better visualize your heart during the echo.This procedure takes approximately one hour. There are no restrictions for this procedure. This will take place at the [address].    Follow-Up: At [facility], you and your health needs are our priority.  As part of our continuing mission to provide you with exceptional heart care, we have created designated Provider Care Teams.  These Care Teams include your primary Cardiologist (physician) and Advanced Practice Providers (APPs -  Physician Assistants and Nurse Practitioners) who all work together to provide you with the care you need, when you need it.  Your next appointment:   6 months  The format for your next appointment:   In Person  Provider:   Sanda Klein, MD

## 2019-01-06 DIAGNOSIS — R269 Unspecified abnormalities of gait and mobility: Secondary | ICD-10-CM | POA: Diagnosis not present

## 2019-01-06 DIAGNOSIS — R062 Wheezing: Secondary | ICD-10-CM | POA: Diagnosis not present

## 2019-01-06 DIAGNOSIS — J9611 Chronic respiratory failure with hypoxia: Secondary | ICD-10-CM | POA: Diagnosis not present

## 2019-01-06 DIAGNOSIS — I5032 Chronic diastolic (congestive) heart failure: Secondary | ICD-10-CM | POA: Diagnosis not present

## 2019-01-15 DIAGNOSIS — R269 Unspecified abnormalities of gait and mobility: Secondary | ICD-10-CM | POA: Diagnosis not present

## 2019-01-15 DIAGNOSIS — I5032 Chronic diastolic (congestive) heart failure: Secondary | ICD-10-CM | POA: Diagnosis not present

## 2019-01-15 DIAGNOSIS — J4520 Mild intermittent asthma, uncomplicated: Secondary | ICD-10-CM | POA: Diagnosis not present

## 2019-01-15 DIAGNOSIS — I10 Essential (primary) hypertension: Secondary | ICD-10-CM | POA: Diagnosis not present

## 2019-01-18 ENCOUNTER — Other Ambulatory Visit: Payer: Self-pay

## 2019-01-18 ENCOUNTER — Ambulatory Visit (HOSPITAL_COMMUNITY): Payer: Medicare HMO | Attending: Cardiology

## 2019-01-18 DIAGNOSIS — I5032 Chronic diastolic (congestive) heart failure: Secondary | ICD-10-CM | POA: Insufficient documentation

## 2019-01-18 DIAGNOSIS — R6889 Other general symptoms and signs: Secondary | ICD-10-CM | POA: Diagnosis not present

## 2019-01-18 MED ORDER — PERFLUTREN LIPID MICROSPHERE
1.0000 mL | INTRAVENOUS | Status: AC | PRN
  Administered 2019-01-18: 3 mL via INTRAVENOUS

## 2019-01-20 ENCOUNTER — Telehealth: Payer: Self-pay | Admitting: *Deleted

## 2019-01-20 ENCOUNTER — Telehealth: Payer: Self-pay | Admitting: Cardiovascular Disease

## 2019-01-20 DIAGNOSIS — I5033 Acute on chronic diastolic (congestive) heart failure: Secondary | ICD-10-CM

## 2019-01-20 DIAGNOSIS — I5032 Chronic diastolic (congestive) heart failure: Secondary | ICD-10-CM

## 2019-01-20 DIAGNOSIS — I480 Paroxysmal atrial fibrillation: Secondary | ICD-10-CM

## 2019-01-20 MED ORDER — SPIRONOLACTONE 25 MG PO TABS: 25.0000 mg | ORAL_TABLET | Freq: Every day | ORAL | 5 refills | Status: DC

## 2019-01-20 MED ORDER — TORSEMIDE 20 MG PO TABS: 60.0000 mg | ORAL_TABLET | Freq: Every day | ORAL | 3 refills | Status: DC

## 2019-01-20 MED ORDER — METOLAZONE 2.5 MG PO TABS: ORAL_TABLET | ORAL | Status: DC

## 2019-01-20 NOTE — Telephone Encounter (Signed)
Patient made aware of results and verbalized understanding.  See previous phone note for details.

## 2019-01-20 NOTE — Telephone Encounter (Signed)
I am glad that BP is higher today. Again, notify us if BP is lower than parameters described in previous note.

## 2019-01-20 NOTE — Telephone Encounter (Signed)
Pt aware of all the med changes and appointments (lab 01/21/19 and Almyra Deforest PA next week ) Pt pain free at this time B/p this am was 154/76. Per pt has been taking ntg daily  Per pt B/P yesterday was 70/65 felt weak. Will forward to Dr [name] for review and recommendations ./cy                  SYLA DEVOSS   ECHO COMPLETE WITH IMAGING ENHANCING AGENT  Order# [id number]    Reading physician: Dorothy Spark, MD Ordering physician: Sanda Klein, MD Study date: 01/18/19      ECHOCARDIOGRAM COMPLETE (Accession [id number]) (Order [id number])   Echocardiography    Date: 12/27/2018 Department: [facility] Ordering/Authorizing: Sanda Klein, MD        ECHOCARDIOGRAM COMPLETE  Order #: [id number] Accession #: [id number]       Patient Info      Patient name: [name]   MRN: [id number]   Age: 73 y.o.   Sex: female               Vitals    BP  Height  Weight  BSA (Calculated - sq m)   96/76 5\' 3"  (1.6 m) 255 lb (115.7 kg) 2.27 sq meters         ECHOCARDIOGRAM COMPLETE: Result Notes      Croitoru, [name], MD  01/19/2019  6:22 PM EST      The echo shows additional deterioration in heart pumping function and confirms fluid buildup that is likely the cause for worsened shortness of breath.  Please go ahead and:  - take torsemide 60 mg every day   - take metolazone 2.5 mg twice a week - say on Thursday and Monday  - take spironolactone 25 mg (a whole tablet instead of half) daily.  Please check BP twice daily at rest, sitting down. Call us if systolic BP is less than 95.  Check BMET and BNP on Friday 12/11 morning.  Once we optimize CHF, will need to discuss whether she needs another heart catheterization.  Schedule office visit next week.                Study Result   Result status: Final result       ECHOCARDIOGRAM REPORT             Patient Name:    [name] Date of Exam: 01/18/2019  Medical Rec #:  [id number]         Height:       63.0 in  Accession #:    [id number]        Weight:       255.0 lb  Date of Birth:  1945/10/18          BSA:          2.14 m  Patient Age:    18 years          BP:           96/76 mmHg  Patient Gender: F                 HR:           65 bpm.  Exam Location:  [address]     Procedure: 2D Echo, Cardiac Doppler, Color Doppler and Intracardiac             Opacification Agent     Indications:    I50.32 CHF  History:        Patient has prior history of Echocardiogram examinations, most                  recent 10/24/2015. CHF and Cardiomyopathy, CAD, Defibrillator,                  TIA, Signs/Symptoms:Dyspnea; Risk Factors:Hypertension,                  Diabetes, Dyslipidemia and Sleep Apnea. Morbid Obesity, Ischemic                  Cardiomyopathy, Edema.     Sonographer:    Deliah Boston RDCS  Referring Phys: El[place]         1. Left ventricular ejection fraction, by visual estimation, is 40 to 45%. The left ventricle has mild to moderately decreased function. There is no left ventricular hypertrophy.   2. Left ventricular diastolic parameters are consistent with Grade II diastolic dysfunction (pseudonormalization).   3. Mildly dilated left ventricular internal cavity size.   4. Global right ventricle has moderately reduced systolic function.The right ventricular size is moderately enlarged. No increase in right ventricular wall thickness.   5. Left atrial size was mildly dilated.   6. Right atrial size was normal.   7. Moderate mitral annular calcification.   8. The mitral valve is normal in structure. No evidence of mitral valve regurgitation. No evidence of mitral stenosis.   9. The tricuspid valve is normal in structure. Tricuspid valve regurgitation moderate.  10. The aortic valve is normal in structure. Aortic valve  regurgitation is mild. No evidence of aortic valve sclerosis or stenosis.  11. The pulmonic valve was normal in structure. Pulmonic valve regurgitation is not visualized.  12. Aneurysm of the ascending aorta, measuring 46 mm.  13. Normal pulmonary artery systolic pressure.  14. The tricuspid regurgitant velocity is 2.52 m/s, and with an assumed right atrial pressure of 3 mmHg, the estimated right ventricular systolic pressure is normal at 28.3 mmHg.  15. A pacer wire is visualized.  16. The inferior vena cava is normal in size with greater than 50% respiratory variability, suggesting right atrial pressure of 3 mmHg.  17. Limited LV visualization despite contrast use. LVEF 40-45% with akinetic septum. RVEF is moderately decreased.  18. There is moderate ascending aortic aneurysm with maximum diameter 46 mm. Aortic root at sinuses level measures 41 mm, there is mild aortic insufficiency.     FINDINGS   Left Ventricle: Left ventricular ejection fraction, by visual estimation, is 40 to 45%. The left ventricle has mild to moderately decreased function. The left ventricular internal cavity size was mildly dilated left ventricle. There is no left   ventricular hypertrophy. Left ventricular diastolic parameters are consistent with Grade II diastolic dysfunction (pseudonormalization). Normal left atrial pressure.     Right Ventricle: The right ventricular size is moderately enlarged. No increase in right ventricular wall thickness. Global RV systolic function is has moderately reduced systolic function. The tricuspid regurgitant velocity is 2.52 m/s, and with an   assumed right atrial pressure of 3 mmHg, the estimated right ventricular systolic pressure is normal at 28.3 mmHg.     Left Atrium: Left atrial size was mildly dilated.     Right Atrium: Right atrial size was normal in size     Pericardium: There is no evidence of pericardial effusion.     Mitral Valve: The mitral valve  is  normal in structure. Moderate mitral annular calcification. No evidence of mitral valve stenosis by observation. No evidence of mitral valve regurgitation.     Tricuspid Valve: The tricuspid valve is normal in structure. Tricuspid valve regurgitation moderate.     Aortic Valve: The aortic valve is normal in structure.. There is mild thickening and mild calcification of the aortic valve. Aortic valve regurgitation is mild. The aortic valve is structurally normal, with no evidence of sclerosis or stenosis. There is   mild thickening of the aortic valve. There is mild calcification of the aortic valve.     Pulmonic Valve: The pulmonic valve was normal in structure. Pulmonic valve regurgitation is not visualized.     Aorta: The aortic root, ascending aorta and aortic arch are all structurally normal, with no evidence of dilitation or obstruction. There is an aneurysm involving the ascending aorta. The aneurysm measures 46 mm.     Venous: The inferior vena cava is normal in size with greater than 50% respiratory variability, suggesting right atrial pressure of 3 mmHg.     IAS/Shunts: No atrial level shunt detected by color flow Doppler. There is no evidence of a patent foramen ovale. No ventricular septal defect is seen or detected. There is no evidence of an atrial septal defect.     Additional Comments: A pacer wire is visualized.     LEFT VENTRICLE  PLAX 2D  LVIDd:         5.32 cm  Diastology  LVIDs:         4.17 cm  LV e' lateral:   5.64 cm/s  LV PW:         1.03 cm  LV E/e' lateral: 18.2  LV IVS:        0.99 cm  LV e' medial:    3.66 cm/s  LVOT diam:     2.90 cm  LV E/e' medial:  28.0  LV SV:         59 ml  LV SV Index:   25.26  LVOT Area:     6.61 cm        RIGHT VENTRICLE  RV S prime:     7.89 cm/s  TAPSE (M-mode): 1.3 cm     LEFT ATRIUM             Index       RIGHT ATRIUM           Index  LA diam:        4.30 cm 2.01 cm/m  RA Area:      18.80 cm  LA Vol (A2C):   78.0 ml 36.38 ml/m RA Volume:   52.30 ml  24.39 ml/m  LA Vol (A4C):   73.9 ml 34.47 ml/m  LA Biplane Vol: 80.7 ml 37.64 ml/m   AORTIC VALVE  LVOT Vmax:   72.10 cm/s  LVOT Vmean:  45.700 cm/s  LVOT VTI:    0.153 m     AORTA  Ao Root diam: 3.65 cm  Ao Asc diam:  4.60 cm     MITRAL VALVE                         TRICUSPID VALVE  MV Area (PHT): cm                   TR Peak grad:   25.3 mmHg  MV PHT:        msec  TR Vmax:        252.00 cm/s  MV Decel Time: 305 msec  MV E velocity: 102.50 cm/s 103 cm/s  SHUNTS  MV A velocity: 36.50 cm/s  70.3 cm/s Systemic VTI:  0.15 m  MV E/A ratio:  2.81        1.5       Systemic Diam: 2.90 cm        Ena Dawley MD  Electronically signed by Ena Dawley MD  Signature Date/Time: 01/19/2019/12:18:39 AM             Final           Syngo Images    Show images for ECHOCARDIOGRAM COMPLETE        Images on [employer]    Show images for [name]        Performing Technologist/Nurse      Performing Technologist/Nurse: Deliah Boston D         Reason for Exam  Priority: Routine  Dx: Chronic diastolic (congestive) heart failure (HCC) [I50.32 (ICD-10-CM)]                                                     Surgical History      Surgical History    Procedure  Laterality  Date  Comment  Source  CARDIAC CATHETERIZATION  05/17/1999 No significant coronary obstructive disease w/ mild 20% luminal irregularity of the first diag branch of the LAD   CARDIAC CATHETERIZATION  07/08/2002 No significant CAD, moderately depressed LV systolic function   CARDIAC CATHETERIZATION Bilateral 04/26/2007 Normal findings, recommend medical therapy   CARDIAC CATHETERIZATION  02/18/2008 Moderate CAD, would benefit from having a functional study, recommend continue medical therapy   CARDIAC  CATHETERIZATION  07/23/2012 Medical therapy   CARDIAC CATHETERIZATION N/A 11/24/2014 Procedure: Left Heart Cath and Coronary Angiography;  Surgeon: Troy Sine, MD;  Location: [place] [facility];  Service: Cardiovascular;  Laterality: N/A;   CARDIAC CATHETERIZATION  11/27/2014 Procedure: Intravascular Pressure Wire/FFR Study;  Surgeon: Peter M Martinique, MD;  Location: [place] [facility];  Service: Cardiovascular;;   CARDIAC CATHETERIZATION  10/09/2016    CORONARY ARTERY BYPASS GRAFT N/A 11/29/2014 Procedure: CORONARY ARTERY BYPASS GRAFTING x 5 (LIMA-LAD, SVG-D, SVG-OM1-OM2, SVG-PD);  Surgeon: Melrose Nakayama, MD;  Location: [place];  Service: Open Heart Surgery;  Laterality: N/A;   INSERT / REPLACE / [place]         Other Surgical History    Procedure  Laterality  Date  Comment  Source  ABDOMINAL ULTRASOUND  12/01/2011 Peripelvic cysts- #1- 2.4x1.9x2.3cm, #2-1.2x0.9x1.2cm   ANKLE FRACTURE SURGERY Right  "had rod put in"   ANTERIOR CERVICAL DECOMP/DISCECTOMY FUSION      APPENDECTOMY      BACK SURGERY      BIOPSY  12/29/2017 Procedure: BIOPSY;  Surgeon: [name], MD;  Location: [facility];  Service: Endoscopy;;   BIV ICD INSERTION CRT-D  2001?    BIV PACEMAKER GENERATOR CHANGE OUT N/A 11/02/2012 Procedure: BIV PACEMAKER GENERATOR CHANGE OUT;  Surgeon: Sanda Klein, MD;  Location: [place] [facility];  Service: Cardiovascular;  Laterality: N/A;   CHOLECYSTECTOMY N/A 04/09/2018 Procedure: LAPAROSCOPIC CHOLECYSTECTOMY;  Surgeon: Greer Pickerel, MD;  Location: [facility];  Service: General;  Laterality: N/A;   COLONOSCOPY WITH PROPOFOL N/A 12/29/2017  Procedure: COLONOSCOPY WITH PROPOFOL;  Surgeon: [name], MD;  Location: [facility];  Service: Endoscopy;  Laterality: N/A;   CORONARY ANGIOGRAM  2010    CORONARY STENT INTERVENTION N/A 10/31/2016 Procedure: CORONARY STENT INTERVENTION;  Surgeon: Burnell Blanks, MD;  Location: [place] [facility];   Service: Cardiovascular;  Laterality: N/A;   ESOPHAGOGASTRODUODENOSCOPY (EGD) WITH PROPOFOL N/A 12/29/2017 Procedure: ESOPHAGOGASTRODUODENOSCOPY (EGD) WITH PROPOFOL;  Surgeon: [name], MD;  Location: [facility];  Service: Endoscopy;  Laterality: N/A;   FRACTURE SURGERY      KNEE ARTHROSCOPY Bilateral     LEFT HEART CATH AND CORS/GRAFTS ANGIOGRAPHY N/A 12/09/2017 Procedure: LEFT HEART CATH AND CORS/GRAFTS ANGIOGRAPHY;  Surgeon: Troy Sine, MD;  Location: [place] [facility];  Service: Cardiovascular;  Laterality: N/A;   LEFT HEART CATHETERIZATION WITH CORONARY ANGIOGRAM N/A 07/23/2012 Procedure: LEFT HEART CATHETERIZATION WITH CORONARY ANGIOGRAM;  Surgeon: Leonie Man, MD;  Location: [facility];  Service: Cardiovascular;  Laterality: N/A;   LEXISCAN MYOVIEW  11/14/2011 Mild-moderate defect seen in Mid Inferolateral and Mid Anterolateral regions-consistant w/ infarct/scar. No significant ischemia demonstrated.   POLYPECTOMY  12/29/2017 Procedure: POLYPECTOMY;  Surgeon: [name], MD;  Location: [facility];  Service: Endoscopy;;   RIGHT/LEFT HEART CATH AND CORONARY ANGIOGRAPHY N/A 10/09/2016 Procedure: RIGHT/LEFT HEART CATH AND CORONARY ANGIOGRAPHY;  Surgeon: Jolaine Artist, MD;  Location: [place] [facility];  Service: Cardiovascular;  Laterality: N/A;   TEE WITHOUT CARDIOVERSION N/A 11/29/2014 Procedure: TRANSESOPHAGEAL ECHOCARDIOGRAM (TEE);  Surgeon: Melrose Nakayama, MD;  Location: [facility];  Service: Open Heart Surgery;  Laterality: N/A;   TRANSTHORACIC ECHOCARDIOGRAM  07/23/2012 EF 55-60%, normal-mild   TUBAL LIGATION                Implants      Permanent Stent      Stent Synergy Des 2.25x16 - [id number] - Implanted Prox CFX   Inventory item: Melissa Noon DES 2.25X16 Model/Cat number: [id number]  Manufacturer: [employer] Lot number: [id number]  Device identifier: [id number] Device identifier type: GS1  Area Of  Implantation: Prox CFX        As of 10/31/2016    Status: Implanted          Stent Synergy Des 2.25x16 - [id number] - Implanted Prox CFX   Inventory item: Melissa Noon DES 7.90W40 Model/Cat number: [id number]  Manufacturer: [employer] Lot number: [id number]  Device identifier: [id number] Device identifier type: GS1  Area Of Implantation: Prox CFX        As of 10/31/2016    Status: Implanted          Stent Synergy Des 2.25x24 - [id number] - Implanted Ramus   Inventory item: Melissa Noon DES 1.49F02 Model/Cat number: [id number]  Manufacturer: [employer] Lot number: [id number]  Device identifier: [id number] Device identifier type: GS1  Area Of Implantation: Ramus        As of 10/31/2016    Status: Implanted          Stent Synergy Des 2.25x20 - [id number] - Implanted Prox CFX   Inventory item: Melissa Noon DES 3.54S56 Model/Cat number: [id number]  Manufacturer: [employer] Lot number: [id number]  Device identifier: [id number] Device identifier type: GS1  Area Of Implantation: Prox CFX        As of 10/31/2016    Status: Implanted      Implant      Bost V273 Intua [id number] - Implanted    Model/Cat number:  V273 INTUA Serial number: [id number]  Manufacturer: Wilkie Aye        As of 11/16/2018    Status: Implanted          Bost [place] [id number] - Implanted    Model/Cat number: 9758 SELUTE Serial number: [id number]  Manufacturer: Wilkie Aye        As of 02/08/2015    Status: Implanted          Guic 8264 170220 - Implanted    Model/Cat number: 1583 Serial number: [id number]  Manufacturer: [place]        As of 02/08/2015    Status: Implanted          Sjcr 7120 [id number] - Implanted    Model/Cat number: 1594 Serial number: [id number]  Manufacturer: [employer] CARDIAC RHYTHM M        As of 02/08/2015    Status: Implanted           Sjcr 7120 Durata [id number] - Implanted    Model/Cat number: 7711 DURATA Serial number: [id number]  Manufacturer: [place] M        As of 10/23/2015    Status: Implanted          Guic 4555 Acuity Steerable [id number] - Implanted    Model/Cat number: 1916 ACUITY STEERABLE Serial number: [id number]  Manufacturer: [place]        As of 03/06/2016    Status: Implanted            Encounter-Level Documents - 12/27/2018:   Document on 12/27/2018 12:25 PM by Ricci Barker, RN: After Visit Summary  Electronic signature on 12/27/2018 10:24 AM - Princess Perna            Order-Level Documents - 12/27/2018:   Scan on 01/19/2019 12:20 AM by Default, Provider, MD  Scan on 01/03/2019 10:42 AM by Margit Hanks: [place]             Resulted by:    Signed  Date/Time     Phone  Pager   Dorothy Spark 01/19/2019 12:18 AM [phone]          External Result Report   External Result Report

## 2019-01-20 NOTE — Telephone Encounter (Signed)
Pt c/o of Chest Pain: STAT if CP now or developed within 24 hours  1. Are you having CP right now?  No- off and on  2. Are you experiencing any other symptoms (ex. SOB, nausea, vomiting, sweating)? No  3. How long have you been experiencing CP? Tuesday  4. Is your CP continuous or coming and going?  Coming and going  5. Have you taken Nitroglycerin?  Yes- she had to take extra ?

## 2019-01-20 NOTE — Telephone Encounter (Signed)
-----   Message from Sanda Klein, MD sent at 01/19/2019  6:22 PM EST ----- The echo shows additional deterioration in heart pumping function and confirms fluid buildup that is likely the cause for worsened shortness of breath. Please go ahead and: - take torsemide 60 mg every day  - take metolazone 2.5 mg twice a week - say on Thursday and Monday - take spironolactone 25 mg (a whole tablet instead of half) daily. Please check BP twice daily at rest, sitting down. Call us if systolic BP is less than 95. Check BMET and BNP on Friday 12/11 morning. Once we optimize CHF, will need to discuss whether she needs another heart catheterization. Schedule office visit next week.

## 2019-01-21 ENCOUNTER — Other Ambulatory Visit: Payer: Self-pay

## 2019-01-21 DIAGNOSIS — E782 Mixed hyperlipidemia: Secondary | ICD-10-CM

## 2019-01-21 DIAGNOSIS — I5033 Acute on chronic diastolic (congestive) heart failure: Secondary | ICD-10-CM

## 2019-01-21 DIAGNOSIS — I480 Paroxysmal atrial fibrillation: Secondary | ICD-10-CM

## 2019-01-21 DIAGNOSIS — I5032 Chronic diastolic (congestive) heart failure: Secondary | ICD-10-CM | POA: Diagnosis not present

## 2019-01-22 LAB — LIPID PANEL
Chol/HDL Ratio: 4.2 ratio (ref 0.0–4.4)
Cholesterol, Total: 186 mg/dL (ref 100–199)
HDL: 44 mg/dL (ref >39)
LDL Chol Calc (NIH): 99 mg/dL (ref 0–99)
Triglycerides: 254 mg/dL — ABNORMAL HIGH (ref 0–149)
VLDL Cholesterol Cal: 43 mg/dL — ABNORMAL HIGH (ref 5–40)

## 2019-01-22 LAB — BASIC METABOLIC PANEL
BUN/Creatinine Ratio: 19 (ref 12–28)
BUN: 38 mg/dL — ABNORMAL HIGH (ref 8–27)
CO2: 29 mmol/L (ref 20–29)
Calcium: 9.5 mg/dL (ref 8.7–10.3)
Chloride: 95 mmol/L — ABNORMAL LOW (ref 96–106)
Creatinine, Ser: 1.99 mg/dL — ABNORMAL HIGH (ref 0.57–1.00)
GFR calc Af Amer: 28 mL/min/{1.73_m2} — ABNORMAL LOW (ref >59)
GFR calc non Af Amer: 24 mL/min/{1.73_m2} — ABNORMAL LOW (ref >59)
Glucose: 136 mg/dL — ABNORMAL HIGH (ref 65–99)
Potassium: 4.3 mmol/L (ref 3.5–5.2)
Sodium: 139 mmol/L (ref 134–144)

## 2019-01-22 LAB — PRO B NATRIURETIC PEPTIDE: NT-Pro BNP: 929 pg/mL — ABNORMAL HIGH (ref 0–301)

## 2019-01-22 NOTE — Progress Notes (Signed)
proBNP confirms fluid overload. Unfortunately, kidney function is also worse than usual.  This often can become a serious problem while we are trying to treat with diuretics. I think we should recheck a basic metabolic panel on Monday, 01/24/2019, and not wait for her office visit.  If kidney function parameters are worse or if she is not improving clinically, she will require hospitalization and right and left heart catheterization . If she is doing better and her kidney function numbers are better, will keep the appointment on 01/27/2019. Cholesterol numbers are acceptable.  Triglycerides are little high, but I would not start any additional medications.

## 2019-01-24 ENCOUNTER — Telehealth: Payer: Self-pay | Admitting: *Deleted

## 2019-01-24 DIAGNOSIS — I5032 Chronic diastolic (congestive) heart failure: Secondary | ICD-10-CM | POA: Diagnosis not present

## 2019-01-24 NOTE — Telephone Encounter (Signed)
-----   Message from Sanda Klein, MD sent at 01/22/2019  5:45 PM EST ----- proBNP confirms fluid overload. Unfortunately, kidney function is also worse than usual.  This often can become a serious problem while we are trying to treat with diuretics. I think we should recheck a basic metabolic panel on Monday, 01/24/2019, and not wait for her office visit.  If kidney function parameters are worse or if she is not improving clinically, she will require hospitalization and right and left heart catheterization . If she is doing better and her kidney function numbers are better, will keep the appointment on 01/27/2019. Cholesterol numbers are acceptable.  Triglycerides are little high, but I would not start any additional medications.

## 2019-01-24 NOTE — Telephone Encounter (Signed)
Patient made aware of results and verbalized understanding.  She will try to come in today or tomorrow pending on a ride. She did state that her breathing was better but she was still weak.

## 2019-01-25 ENCOUNTER — Telehealth: Payer: Self-pay | Admitting: *Deleted

## 2019-01-25 DIAGNOSIS — I5032 Chronic diastolic (congestive) heart failure: Secondary | ICD-10-CM

## 2019-01-25 LAB — BASIC METABOLIC PANEL
BUN/Creatinine Ratio: 25 (ref 12–28)
BUN: 55 mg/dL — ABNORMAL HIGH (ref 8–27)
CO2: 34 mmol/L — ABNORMAL HIGH (ref 20–29)
Calcium: 9.6 mg/dL (ref 8.7–10.3)
Chloride: 92 mmol/L — ABNORMAL LOW (ref 96–106)
Creatinine, Ser: 2.19 mg/dL — ABNORMAL HIGH (ref 0.57–1.00)
GFR calc Af Amer: 25 mL/min/{1.73_m2} — ABNORMAL LOW (ref >59)
GFR calc non Af Amer: 22 mL/min/{1.73_m2} — ABNORMAL LOW (ref >59)
Glucose: 166 mg/dL — ABNORMAL HIGH (ref 65–99)
Potassium: 3.3 mmol/L — ABNORMAL LOW (ref 3.5–5.2)
Sodium: 140 mmol/L (ref 134–144)

## 2019-01-25 MED ORDER — POTASSIUM CHLORIDE CRYS ER 20 MEQ PO TBCR: 20.0000 meq | EXTENDED_RELEASE_TABLET | Freq: Two times a day (BID) | ORAL | 3 refills | Status: DC

## 2019-01-25 NOTE — Telephone Encounter (Signed)
-----   Message from Sanda Klein, MD sent at 01/25/2019  8:23 AM EST ----- Any chance we could set up to be seen in the heart failure clinic this week?  I think she will need a right and left heart catheterization, but will probably require a "tune up" first. Please have her stop the spironolactone and start potassium chloride 20 mEq twice daily. Recheck bmet on Friday please.

## 2019-01-25 NOTE — Telephone Encounter (Signed)
Patient made aware of results and verbalized understanding.  The patient has an appointment on 12/17 with Almyra Deforest, PA. She will have her BMET then.  She stated that she has seen Dr. Haroldine Laws in the past.   She would also like to know if she should be on the prednisone.

## 2019-01-26 ENCOUNTER — Other Ambulatory Visit: Payer: Self-pay | Admitting: *Deleted

## 2019-01-26 DIAGNOSIS — I5033 Acute on chronic diastolic (congestive) heart failure: Secondary | ICD-10-CM

## 2019-01-27 ENCOUNTER — Ambulatory Visit (INDEPENDENT_AMBULATORY_CARE_PROVIDER_SITE_OTHER): Payer: Medicare HMO | Admitting: Physician Assistant

## 2019-01-27 ENCOUNTER — Encounter: Payer: Self-pay | Admitting: Physician Assistant

## 2019-01-27 ENCOUNTER — Other Ambulatory Visit: Payer: Self-pay

## 2019-01-27 VITALS — BP 120/60 | HR 71 | Ht 63.0 in | Wt 252.0 lb

## 2019-01-27 DIAGNOSIS — N183 Chronic kidney disease, stage 3 unspecified: Secondary | ICD-10-CM | POA: Diagnosis not present

## 2019-01-27 DIAGNOSIS — Z950 Presence of cardiac pacemaker: Secondary | ICD-10-CM | POA: Diagnosis not present

## 2019-01-27 DIAGNOSIS — I5042 Chronic combined systolic (congestive) and diastolic (congestive) heart failure: Secondary | ICD-10-CM | POA: Diagnosis not present

## 2019-01-27 DIAGNOSIS — I10 Essential (primary) hypertension: Secondary | ICD-10-CM

## 2019-01-27 DIAGNOSIS — I442 Atrioventricular block, complete: Secondary | ICD-10-CM | POA: Diagnosis not present

## 2019-01-27 MED ORDER — METOLAZONE 2.5 MG PO TABS: 2.5000 mg | ORAL_TABLET | Freq: Every day | ORAL | Status: DC | PRN

## 2019-01-27 NOTE — Patient Instructions (Signed)
Medication Instructions:  Almyra Deforest, PA has recommended making the following medication changes: 1. CONTINUE TO HOLD Spironolatone 2. HOLD Torsemide today and tomorrow - then resume at 60 mg (3 tablets) daily 3. TAKE Metolazone only as needed for weight gain greater than 3 pounds  *If you need a refill on your cardiac medications before your next appointment, please call your pharmacy*  Lab Work: Your physician recommends that you return for lab work next week. basic metabolic panel  If you have labs (blood work) drawn today and your tests are completely normal, you will receive your results only by: [name] MyChart Message (if you have MyChart) OR . A paper copy in the mail If you have any lab test that is abnormal or we need to change your treatment, we will call you to review the results.  Follow-Up: At [facility], you and your health needs are our priority.  As part of our continuing mission to provide you with exceptional heart care, we have created designated Provider Care Teams.  These Care Teams include your primary Cardiologist (physician) and Advanced Practice Providers (APPs -  Physician Assistants and Nurse Practitioners) who all work together to provide you with the care you need, when you need it.  Your next appointment:   Monday, January 11th, 2021 at 8:20 am  The format for your next appointment:   In Person  Provider:   Dr Sanda Klein

## 2019-01-27 NOTE — Progress Notes (Signed)
Clinical worsening of CHF, poorly responsive to enhanced diuretics, with worsening renal function. Echo shows deteriorating LVEF with dyssynchrony. ECG shows very broad QRS (224 ms) without initial R wave in V1.  Comprehensive CRT_P check. Chronic very high atrial pacing threshold, device programmed VDD. Normal atrial sensing. Normal lead impedances. Normal RV pacing threshold, unchanged. Multiple LV pacing configurations tried. LV tip to can remains lowest threshold. Increased LV pacing threshold 3.5V@1 .0 ms or 2.4V@2 .0 ms. Device was programmed 1.9V@0 .8 ms. LV lead output adjusted to 3.0V@2 .0 ms.  Repeat ECG shows prominent initial R wave in V1 and narrower QRS 188 ms.  Diuretics reduced due to worsening renal function and low BP. Labs rechecked today. Bring back for clinical reevaluation and repeat CRT-P fine-tuning on Monday February 21, 2019.  Sanda Klein, MD, [facility] [phone] office [phone] pager

## 2019-01-27 NOTE — Progress Notes (Signed)
Cardiology Office Note:    Date:  01/29/2019   ID:  [name], DOB 15-Jun-1945, MRN [id number]  PCP:  [facility]  Cardiologist:  Sanda Klein, MD  Electrophysiologist:  None   Referring MD: [facility]   No chief complaint on file.   History of Present Illness:    [name] is a 73 y.o. female with a hx of CAD s/p CABG 11/2014 (LIMA to LAD, SVG to D1, SVG to OM1 and OM2, SVG to PDA),chronic respiratory insufficiency on 2 L home J8,S/N diastolic heart failure, morbid obesity, adrenal insufficiency, type 2 diabetes mellitus, hypertension, stage IIICKD, asthmaandobstructive sleep apnea. She had a history of nonischemic cardiomyopathy and heart failure preceding the diagnosis of CAD. She also had complete heart block and pacemaker placed in 2001, this was upgraded to biventricular ICD implanted in 2008 for severe cardiomyopathy with EF 20%,downgraded to CRT-P in 2014([employer])following excellent response from resynchronization with normalization of LV function. The current atrial and right ventricular leads were implanted in 2001, LV lead was implanted in 2008. The defibrillator leads implanted in 2008 was capped and its tip resected at the time of bypass surgery.Cardiac catheterization obtained on 10/09/2016 showed severe three-vessel CAD, patent LIMA to LAD, patent SVG to diagonal, patent SVG to RCA, SVG to left circumflex and OM were occluded ostially. EF was 60%. Right heart cath at the time showed normal wedge pressure and cardiac output. Patient eventually and had DES x3 in the AV groove left circumflex and DES x1 in the intermediate branch on 10/31/2016. She was placed on aspirin and Plavix. Carvedilol has been switched to bisoprolol in Apr 2019.  Due to recurrent angina, she underwent repeat cardiac catheterization in October 2019 which showed 100% proximal RCA, 60% ostial diagonal, 50% ostial D2, 100% mid LAD, patent  ramus and left circumflex stent, patent LIMA to LAD, occluded SVG to ramus and the left circumflex vessel, EF 50 to 55%.  Medical therapy was recommended.    Patient was most recently seen by Dr. [name] on 12/27/2018, her weight was 255 pounds.  Her previous dry weight was 248 pounds.  Echocardiogram was repeated on 01/18/2019 which showed EF 40 to 45%, grade 2 DD, ascending aortic aneurysm measuring at 46 mm.  Dr. [name] recommended take torsemide 60 mg daily and metolazone 2.5 mg twice daily.  She is also instructed to increase spironolactone to 25 mg daily.  Lab work showed elevated proBNP.  Unfortunately lab work shows worsening renal function.  Spironolactone was stopped and potassium supplement was added due to hyperkalemia.  Patient presents today for follow-up.  Device interrogation done by Dr. [name] suggested that patient has lost one side of ventricular pacing.  He is being paced only from one of the ventricle instead of both ventricles.  Patient also appears to be dry with occasional hypotension at home.  I recommended she hold torsemide 60 mg for 2 days before restarting on the third day.  We will transition her metolazone to as needed.  She will temporarily hold spironolactone for the time being.  She will need a basic metabolic panel in 1 week and if renal function improved, then we will restart spironolactone.  On physical exam, her lungs is clear and she does not have any lower extremity edema.  She can follow-up with Dr. [name] in 1 month   Past Medical History:  Diagnosis Date  . AICD (automatic cardioverter/defibrillator) present   . Anemia   .  Arthritis   . Asthma   . CAD (coronary artery disease)    a. 10/09/16 LHC: SVG->LAD patent, SVG->Diag patent, SVG->RCA patent, SVG->LCx occluded. EF 60%, b. 10/31/16 LHC DES to AV groove Circ, DES to intermed branch  . Cellulitis and abscess of foot 12/2014   RT FOOT  . CHF (congestive heart failure) ([place])   . Chronic bronchitis  ([place])   . Chronic lower back pain   . Diverticulosis   . Facial numbness 02/2016  . Fatty liver disease, nonalcoholic   . Gastritis   . GERD (gastroesophageal reflux disease)   . Gout   . H/O hiatal hernia   . High cholesterol   . Hyperplastic colon polyp   . Hypertension   . IBS (irritable bowel syndrome)   . Internal hemorrhoids   . Ischemic cardiomyopathy   . Obesity   . On home oxygen therapy    "2L at night" (10/31/2016)  . OSA (obstructive sleep apnea)    "can't tolerate a mask" (10/30/2016)  . Pneumonia    "couple times" (10/31/2016)  . Shortness of breath   . TIA (transient ischemic attack)    "recently" (10/31/2016)  . Type II diabetes mellitus ([place])     Past Surgical History:  Procedure Laterality Date  . ABDOMINAL ULTRASOUND  12/01/2011   Peripelvic cysts- #1- 2.4x1.9x2.3cm, #2-1.2x0.9x1.2cm  . ANKLE FRACTURE SURGERY Right    "had rod put in"  . ANTERIOR CERVICAL DECOMP/DISCECTOMY FUSION    . APPENDECTOMY    . BACK SURGERY    . BIOPSY  12/29/2017   Procedure: BIOPSY;  Surgeon: [name], MD;  Location: [facility];  Service: Endoscopy;;  . BIV ICD INSERTION CRT-D  2001?  [name] BIV PACEMAKER GENERATOR CHANGE OUT N/A 11/02/2012   Procedure: BIV PACEMAKER GENERATOR CHANGE OUT;  Surgeon: Sanda Klein, MD;  Location: [place] CATH LAB;  Service: Cardiovascular;  Laterality: N/A;  . CARDIAC CATHETERIZATION  05/17/1999   No significant coronary obstructive disease w/ mild 20% luminal irregularity of the first diag branch of the LAD  . CARDIAC CATHETERIZATION  07/08/2002   No significant CAD, moderately depressed LV systolic function  . CARDIAC CATHETERIZATION Bilateral 04/26/2007   Normal findings, recommend medical therapy  . CARDIAC CATHETERIZATION  02/18/2008   Moderate CAD, would benefit from having a functional study, recommend continue medical therapy  . CARDIAC CATHETERIZATION  07/23/2012   Medical therapy  . CARDIAC CATHETERIZATION N/A 11/24/2014   Procedure:  Left Heart Cath and Coronary Angiography;  Surgeon: Troy Sine, MD;  Location: [place] CV LAB;  Service: Cardiovascular;  Laterality: N/A;  . CARDIAC CATHETERIZATION  11/27/2014   Procedure: Intravascular Pressure Wire/FFR Study;  Surgeon: Peter M Martinique, MD;  Location: [place] CV LAB;  Service: Cardiovascular;;  . CARDIAC CATHETERIZATION  10/09/2016  . CHOLECYSTECTOMY N/A 04/09/2018   Procedure: LAPAROSCOPIC CHOLECYSTECTOMY;  Surgeon: Greer Pickerel, MD;  [facility]: [facility];  Service: General;  Laterality: N/A;  . COLONOSCOPY WITH PROPOFOL N/A 12/29/2017   Procedure: COLONOSCOPY WITH PROPOFOL;  Surgeon: [name], MD;  [facility];  Service: Endoscopy;  Laterality: N/A;  . CORONARY ANGIOGRAM  2010  . CORONARY ARTERY BYPASS GRAFT N/A 11/29/2014   Procedure: CORONARY ARTERY BYPASS GRAFTING x 5 (LIMA-LAD, SVG-D, SVG-OM1-OM2, SVG-PD);  Surgeon: Melrose Nakayama, MD;  Location: [place];  Service: Open Heart Surgery;  Laterality: N/A;  . CORONARY STENT INTERVENTION N/A 10/31/2016   Procedure: CORONARY STENT INTERVENTION;  Surgeon: Burnell Blanks, MD;  Location:  [place] INVASIVE CV LAB;  Service: Cardiovascular;  Laterality: N/A;  . ESOPHAGOGASTRODUODENOSCOPY (EGD) WITH PROPOFOL N/A 12/29/2017   Procedure: ESOPHAGOGASTRODUODENOSCOPY (EGD) WITH PROPOFOL;  Surgeon: [name], MD;  [facility];  Service: Endoscopy;  Laterality: N/A;  . FRACTURE SURGERY    . INSERT / REPLACE / [place]  . KNEE ARTHROSCOPY Bilateral   . LEFT HEART CATH AND CORS/GRAFTS ANGIOGRAPHY N/A 12/09/2017   Procedure: LEFT HEART CATH AND CORS/GRAFTS ANGIOGRAPHY;  Surgeon: Troy Sine, MD;  Location: [place] CV LAB;  Service: Cardiovascular;  Laterality: N/A;  . LEFT HEART CATHETERIZATION WITH CORONARY ANGIOGRAM N/A 07/23/2012   Procedure: LEFT HEART CATHETERIZATION WITH CORONARY ANGIOGRAM;  Surgeon: Leonie Man, MD;  Location: [facility];  Service:  Cardiovascular;  Laterality: N/A;  Carlton Adam MYOVIEW  11/14/2011   Mild-moderate defect seen in Mid Inferolateral and Mid Anterolateral regions-consistant w/ infarct/scar. No significant ischemia demonstrated.  [name] POLYPECTOMY  12/29/2017   Procedure: POLYPECTOMY;  Surgeon: [name], MD;  [facility];  Service: Endoscopy;;  . RIGHT/LEFT HEART CATH AND CORONARY ANGIOGRAPHY N/A 10/09/2016   Procedure: RIGHT/LEFT HEART CATH AND CORONARY ANGIOGRAPHY;  Surgeon: Jolaine Artist, MD;  Location: [place] CV LAB;  Service: Cardiovascular;  Laterality: N/A;  . TEE WITHOUT CARDIOVERSION N/A 11/29/2014   Procedure: TRANSESOPHAGEAL ECHOCARDIOGRAM (TEE);  Surgeon: Melrose Nakayama, MD;  Location: [place];  Service: Open Heart Surgery;  Laterality: N/A;  . TRANSTHORACIC ECHOCARDIOGRAM  07/23/2012   EF 55-60%, normal-mild  . TUBAL LIGATION      Current Medications: Current Meds  Medication Sig  . acetaminophen (TYLENOL) 500 MG tablet Take 1,000 mg by mouth every 4 (four) hours as needed for headache (pain).   [name] albuterol (PROVENTIL HFA;VENTOLIN HFA) 108 (90 Base) MCG/ACT inhaler Inhale 2 puffs into the lungs every 6 (six) hours as needed for wheezing or shortness of breath.  [name] albuterol (PROVENTIL) (2.5 MG/3ML) 0.083% nebulizer solution Take 3 mLs (2.5 mg total) by nebulization every 6 (six) hours as needed for wheezing or shortness of breath.  [name] aspirin EC 81 MG tablet Take 1 tablet (81 mg total) by mouth daily.  . bisoprolol (ZEBETA) 5 MG tablet TAKE 1 TABLET EVERY DAY  . budesonide-formoterol (SYMBICORT) 160-4.5 MCG/ACT inhaler Inhale 2 puffs into the lungs 2 (two) times daily.  . cetirizine (ZYRTEC) 10 MG tablet Take 10 mg by mouth daily.   . clopidogrel (PLAVIX) 75 MG tablet Take 1 tablet (75 mg total) by mouth daily.  . clotrimazole (LOTRIMIN) 1 % cream Apply 1 application topically 2 (two) times daily as needed (rash).  . clotrimazole (MYCELEX) 10 MG troche Take 10 mg by  mouth daily as needed ([place]ore [place]outh).   . colchicine 0.6 MG tablet Take 0.6 mg by mouth 2 (two) times daily as needed (gout flare).   . cycloSPORINE (RESTASIS) 0.05 % ophthalmic emulsion Place 1 drop into both eyes 2 (two) times daily.  . diclofenac sodium (VOLTAREN) 1 % GEL Apply 2 g topically 4 (four) times daily.  . diphenhydrAMINE (BENADRYL) 25 MG tablet Take 25 mg by mouth every 6 (six) hours as needed for allergies.  [name] EPINEPHrine (EPIPEN 2-PAK) 0.3 mg/0.3 mL IJ SOAJ injection Inject 0.3 mg into the muscle once as needed (severe allergic reaction).  . febuxostat (ULORIC) 40 MG tablet Take 40 mg by mouth daily.  [name] GLUCOSAMINE-CHONDROITIN PO Take 1 tablet by mouth daily.  [name] guaiFENesin (MUCINEX) 600 MG 12 hr tablet Take  600 mg by mouth at bedtime as needed for cough or to loosen phlegm.   . hydrocortisone 2.5 % cream Apply 1 application topically daily as needed (itching).   . hydroxypropyl methylcellulose / hypromellose (ISOPTO TEARS / GONIOVISC) 2.5 % ophthalmic solution Place 1 drop into both eyes 3 (three) times daily.   [name] ipratropium (ATROVENT) 0.02 % nebulizer solution Take 2.5 mLs (0.5 mg total) by nebulization 4 (four) times daily.  . isosorbide mononitrate (IMDUR) 30 MG 24 hr tablet Take 1 tablet (30 mg total) by mouth daily.  [name] levofloxacin (LEVAQUIN) 500 MG tablet Take 500 mg by mouth daily.  . metolazone (ZAROXOLYN) 2.5 MG tablet Take 1 tablet (2.5 mg total) by mouth daily as needed (for weight gain greater than 3 pounds). Take one tablet on Monday and one tablet on Thursday.  . Multiple Vitamin (MULTIVITAMIN WITH MINERALS) TABS tablet Take 1 tablet by mouth daily.  . nitroGLYCERIN (NITROSTAT) 0.4 MG SL tablet Place 1 tablet (0.4 mg total) under the tongue every 5 (five) minutes as needed for chest pain.  [name] ondansetron (ZOFRAN ODT) 4 MG disintegrating tablet Take 1 tablet (4 mg total) by mouth every 8 (eight) hours as needed for nausea or vomiting.  . OXYGEN Place 3 L into the nose  continuous.   . polyethylene glycol (MIRALAX / GLYCOLAX) 17 g packet Take 17 g by mouth daily.  . potassium chloride SA (KLOR-CON) 20 MEQ tablet Take 1 tablet (20 mEq total) by mouth 2 (two) times daily.  . rosuvastatin (CRESTOR) 20 MG tablet TAKE 1 TABLET AT BEDTIME  . saxagliptin HCl (ONGLYZA) 2.5 MG TABS tablet Take 2.5 mg by mouth daily.   [name] torsemide (DEMADEX) 20 MG tablet Take 3 tablets (60 mg total) by mouth daily.  [name] triamcinolone cream (KENALOG) 0.1 % Apply 1 application topically 2 (two) times daily as needed (itching).   . Vitamin D, Ergocalciferol, (DRISDOL) 1.25 MG (50000 UT) CAPS capsule Take 1 capsule (50,000 Units total) by mouth every 7 (seven) days.  . [DISCONTINUED] metolazone (ZAROXOLYN) 2.5 MG tablet Take one tablet on Monday and one tablet on Thursday.     Allergies:   Ivp dye [iodinated diagnostic agents], Shellfish allergy, Shellfish-derived products, Sulfa antibiotics, Iodine, Atorvastatin, Doxycycline, Cephalexin, and Zithromax [azithromycin]   Social History   Socioeconomic History  . Marital status: Widowed    Spouse name: Not on file  . Number of children: 5  . Years of education: Not on file  . Highest education level: Not on file  Occupational History  . Occupation: STAFF/BUFFET    Employer: [employer]  Tobacco Use  . Smoking status: Former Smoker    Packs/day: 0.25    Years: 3.00    Pack years: 0.75    Types: Cigarettes    Quit date: 02/11/1968    Years since quitting: 51.0  . Smokeless tobacco: Never Used  Substance and Sexual Activity  . Alcohol use: No  . Drug use: No  . Sexual activity: Never  Other Topics Concern  . Not on file  Social History Narrative   Widowed last year. She lives with her two sons.   Social Determinants of Health   Financial Resource Strain:   . Difficulty of Paying Living Expenses: Not on file  Food Insecurity:   . Worried About Charity fundraiser in the Last Year: Not on file  . Ran Out of Food in  the Last Year: Not on file  Transportation Needs:   .  Lack of Transportation (Medical): Not on file  . Lack of Transportation (Non-Medical): Not on file  Physical Activity:   . Days of Exercise per Week: Not on file  . Minutes of Exercise per Session: Not on file  Stress:   . Feeling of Stress : Not on file  Social Connections:   . Frequency of Communication with Friends and Family: Not on file  . Frequency of Social Gatherings with Friends and Family: Not on file  . Attends Religious Services: Not on file  . Active Member of Clubs or Organizations: Not on file  . Attends Archivist Meetings: Not on file  . Marital Status: Not on file     Family History: The patient's family history includes Asthma in her sister; Breast cancer in her mother; Diabetes in her maternal grandmother and mother; Glaucoma in her maternal aunt; Heart disease in her maternal aunt and maternal grandmother; Kidney disease in her maternal grandmother. There is no history of Colon cancer, Stomach cancer, or Pancreatic cancer.  ROS:   Please see the history of present illness.     All other systems reviewed and are negative.  EKGs/Labs/Other Studies Reviewed:    The following studies were reviewed today: Echo 01/18/2019 IMPRESSIONS    1. Left ventricular ejection fraction, by visual estimation, is 40 to 45%. The left ventricle has mild to moderately decreased function. There is no left ventricular hypertrophy.  2. Left ventricular diastolic parameters are consistent with Grade II diastolic dysfunction (pseudonormalization).  3. Mildly dilated left ventricular internal cavity size.  4. Global right ventricle has moderately reduced systolic function.The right ventricular size is moderately enlarged. No increase in right ventricular wall thickness.  5. Left atrial size was mildly dilated.  6. Right atrial size was normal.  7. Moderate mitral annular calcification.  8. The mitral valve is normal in  structure. No evidence of mitral valve regurgitation. No evidence of mitral stenosis.  9. The tricuspid valve is normal in structure. Tricuspid valve regurgitation moderate. 10. The aortic valve is normal in structure. Aortic valve regurgitation is mild. No evidence of aortic valve sclerosis or stenosis. 11. The pulmonic valve was normal in structure. Pulmonic valve regurgitation is not visualized. 12. Aneurysm of the ascending aorta, measuring 46 mm. 13. Normal pulmonary artery systolic pressure. 14. The tricuspid regurgitant velocity is 2.52 m/s, and with an assumed right atrial pressure of 3 mmHg, the estimated right ventricular systolic pressure is normal at 28.3 mmHg. 15. A pacer wire is visualized. 16. The inferior vena cava is normal in size with greater than 50% respiratory variability, suggesting right atrial pressure of 3 mmHg. 17. Limited LV visualization despite contrast use. LVEF 40-45% with akinetic septum. RVEF is moderately decreased. 18. There is moderate ascending aortic aneurysm with maximum diameter 46 mm. Aortic root at sinuses level measures 41 mm, there is mild aortic insufficiency.  EKG:  EKG is ordered today.  The ekg ordered today demonstrates ventricularly paced rhythm.  2 separate EKG were obtained.  EKG #1 obtained prior to pacemaker chest pain.  EKG #2 obtain after pacemaker adjustment.  Recent Labs: 05/01/2018: ALT 35; B Natriuretic Peptide 142.7 10/10/2018: Hemoglobin 11.7; Platelets 137 01/21/2019: NT-Pro BNP 929 01/24/2019: BUN 55; Creatinine, Ser 2.19; Potassium 3.3; Sodium 140  Recent Lipid Panel    Component Value Date/Time   CHOL 186 01/21/2019 1045   TRIG 254 (H) 01/21/2019 1045   HDL 44 01/21/2019 1045   CHOLHDL 4.2 01/21/2019 1045   CHOLHDL 4.3 10/31/2016  0314   VLDL 36 10/31/2016 0314   LDLCALC 99 01/21/2019 1045    Physical Exam:    VS:  BP 120/60   Pulse 71   Ht 5\' 3"  (1.6 m)   Wt 252 lb (114.3 kg)   BMI 44.64 kg/m     Wt Readings from  Last 3 Encounters:  01/27/19 252 lb (114.3 kg)  12/27/18 255 lb (115.7 kg)  10/10/18 250 lb (113.4 kg)     GEN:  Well nourished, well developed in no acute distress HEENT: Normal NECK: No JVD; No carotid bruits LYMPHATICS: No lymphadenopathy CARDIAC: RRR, no murmurs, rubs, gallops RESPIRATORY:  Clear to auscultation without rales, wheezing or rhonchi  ABDOMEN: Soft, non-tender, non-distended MUSCULOSKELETAL:  No edema; No deformity  SKIN: Warm and dry NEUROLOGIC:  Alert and oriented x 3 PSYCHIATRIC:  Normal affect   ASSESSMENT:    1. Chronic combined systolic and diastolic heart failure ([place])   2. CHB (complete heart block) ([facility])   3. Status post biventricular pacemaker   4. Morbid obesity ([place])   5. Essential hypertension   6. Stage 3 chronic kidney disease, unspecified whether stage 3a or 3b CKD    PLAN:    In order of problems listed above:  1. Chronic combined systolic and diastolic heart failure: Patient seen with Dr. [name] today, she appears to be dry after recent diuretic therapy.  Creatinine continued to trend up on recent lab work as well.  We decided to hold her metolazone, we also held her spironolactone as well.  She was instructed to hold torsemide for 2 days before restarting at regular dose.  She will need a basic metabolic panel next week.  Once renal function improve, I likely will restart spironolactone and use metolazone only on a as needed basis for weight increase greater than 3 pounds.  2. LV dysfunction: She had a drop in ejection fraction on recent echocardiogram.  This was reviewed by Dr. [name].  Apparently, she has lost ventricular pacing on one side.  Pacemaker has been adjusted  3. Status post biventricular pacemaker: Pacemaker was adjusted today.  She has lost ventricular pacing in one of the ventricle.  Continue to follow-up with Dr. [name] in 2-4 weeks  4. Morbid obesity: Weight loss imperative  5. Hypertension: Blood pressure has been  soft recently, she is likely dehydrated  6. Stage III CKD: Worsening renal function noted on recent lab work likely related to overdiuresis.   Medication Adjustments/Labs and Tests Ordered: Current medicines are reviewed at length with the patient today.  Concerns regarding medicines are outlined above.  Orders Placed This Encounter  Procedures  . Basic metabolic panel  . EKG 12-Lead   Meds ordered this encounter  Medications  . metolazone (ZAROXOLYN) 2.5 MG tablet    Sig: Take 1 tablet (2.5 mg total) by mouth daily as needed (for weight gain greater than 3 pounds). Take one tablet on Monday and one tablet on Thursday.    Patient Instructions  Medication Instructions:  Almyra Deforest, PA has recommended making the following medication changes: 1. CONTINUE TO HOLD Spironolatone 2. HOLD Torsemide today and tomorrow - then resume at 60 mg (3 tablets) daily 3. TAKE Metolazone only as needed for weight gain greater than 3 pounds  *If you need a refill on your cardiac medications before your next appointment, please call your pharmacy*  Lab Work: Your physician recommends that you return for lab work next week. basic metabolic panel  If you have labs (blood  work) drawn today and your tests are completely normal, you will receive your results only by: [name] MyChart Message (if you have MyChart) OR . A paper copy in the mail If you have any lab test that is abnormal or we need to change your treatment, we will call you to review the results.  Follow-Up: At [facility], you and your health needs are our priority.  As part of our continuing mission to provide you with exceptional heart care, we have created designated Provider Care Teams.  These Care Teams include your primary Cardiologist (physician) and Advanced Practice Providers (APPs -  Physician Assistants and Nurse Practitioners) who all work together to provide you with the care you need, when you need it.  Your next appointment:     Monday, January 11th, 2021 at 8:20 am  The format for your next appointment:   In Person  Provider:   Dr Sanda Klein    Signed, Almyra Deforest, [place]  01/29/2019 11:36 PM    [place]

## 2019-01-29 ENCOUNTER — Encounter: Payer: Self-pay | Admitting: Physician Assistant

## 2019-02-01 DIAGNOSIS — I5042 Chronic combined systolic (congestive) and diastolic (congestive) heart failure: Secondary | ICD-10-CM | POA: Diagnosis not present

## 2019-02-01 DIAGNOSIS — Z950 Presence of cardiac pacemaker: Secondary | ICD-10-CM | POA: Diagnosis not present

## 2019-02-01 DIAGNOSIS — I442 Atrioventricular block, complete: Secondary | ICD-10-CM | POA: Diagnosis not present

## 2019-02-01 LAB — BASIC METABOLIC PANEL
BUN/Creatinine Ratio: 31 — ABNORMAL HIGH (ref 12–28)
BUN: 60 mg/dL — ABNORMAL HIGH (ref 8–27)
CO2: 35 mmol/L — ABNORMAL HIGH (ref 20–29)
Calcium: 9.7 mg/dL (ref 8.7–10.3)
Chloride: 90 mmol/L — ABNORMAL LOW (ref 96–106)
Creatinine, Ser: 1.94 mg/dL — ABNORMAL HIGH (ref 0.57–1.00)
GFR calc Af Amer: 29 mL/min/{1.73_m2} — ABNORMAL LOW (ref >59)
GFR calc non Af Amer: 25 mL/min/{1.73_m2} — ABNORMAL LOW (ref >59)
Glucose: 162 mg/dL — ABNORMAL HIGH (ref 65–99)
Potassium: 3.5 mmol/L (ref 3.5–5.2)
Sodium: 138 mmol/L (ref 134–144)

## 2019-02-05 DIAGNOSIS — I5032 Chronic diastolic (congestive) heart failure: Secondary | ICD-10-CM | POA: Diagnosis not present

## 2019-02-05 DIAGNOSIS — J9611 Chronic respiratory failure with hypoxia: Secondary | ICD-10-CM | POA: Diagnosis not present

## 2019-02-05 DIAGNOSIS — R269 Unspecified abnormalities of gait and mobility: Secondary | ICD-10-CM | POA: Diagnosis not present

## 2019-02-05 DIAGNOSIS — R062 Wheezing: Secondary | ICD-10-CM | POA: Diagnosis not present

## 2019-02-07 ENCOUNTER — Other Ambulatory Visit: Payer: Self-pay

## 2019-02-07 DIAGNOSIS — I5042 Chronic combined systolic (congestive) and diastolic (congestive) heart failure: Secondary | ICD-10-CM

## 2019-02-12 ENCOUNTER — Telehealth: Payer: Self-pay | Admitting: Medical

## 2019-02-12 NOTE — Telephone Encounter (Addendum)
   Patient called the after hours line with complaints of not feeling well. For the past 2 days she has been having intermittent sharp chest pain which typically lasts 20 minutes and resolves with 1 SL nitro. Pain occurs at rest and with activity but not all the time; 4 episodes in the past 2 days. Also with some SOB. No LE edema. Weights going down - 252lbs yesterday to 249lbs today. She does not feel like she has extra fluid on board. Blood pressures over the past 24 hours have been 120s-150s/60s-70s with HR in the 60s. She reports feeling generally unwell. Thought about going to the ED yesterday but didn't want to wait. Due to her complicated cardiac history and recent nitro responsive chest pain, she was advised to go to the ED for further evaluation. Patient was in agreement with the plan and appreciative of the call.   Abigail Butts, PA-C  02/12/19; 3:11 PM

## 2019-02-14 ENCOUNTER — Telehealth (HOSPITAL_COMMUNITY): Payer: Self-pay | Admitting: Vascular Surgery

## 2019-02-14 ENCOUNTER — Other Ambulatory Visit: Payer: Self-pay

## 2019-02-14 ENCOUNTER — Ambulatory Visit (INDEPENDENT_AMBULATORY_CARE_PROVIDER_SITE_OTHER): Payer: Medicare HMO | Admitting: Cardiovascular Disease

## 2019-02-14 ENCOUNTER — Encounter: Payer: Self-pay | Admitting: Cardiovascular Disease

## 2019-02-14 VITALS — BP 130/66 | HR 72 | Temp 96.4°F | Ht 64.0 in | Wt 249.0 lb

## 2019-02-14 DIAGNOSIS — Z950 Presence of cardiac pacemaker: Secondary | ICD-10-CM

## 2019-02-14 DIAGNOSIS — I2510 Atherosclerotic heart disease of native coronary artery without angina pectoris: Secondary | ICD-10-CM

## 2019-02-14 DIAGNOSIS — E782 Mixed hyperlipidemia: Secondary | ICD-10-CM

## 2019-02-14 DIAGNOSIS — I442 Atrioventricular block, complete: Secondary | ICD-10-CM

## 2019-02-14 DIAGNOSIS — I5042 Chronic combined systolic (congestive) and diastolic (congestive) heart failure: Secondary | ICD-10-CM

## 2019-02-14 DIAGNOSIS — E1122 Type 2 diabetes mellitus with diabetic chronic kidney disease: Secondary | ICD-10-CM

## 2019-02-14 DIAGNOSIS — G4733 Obstructive sleep apnea (adult) (pediatric): Secondary | ICD-10-CM | POA: Diagnosis not present

## 2019-02-14 DIAGNOSIS — E1121 Type 2 diabetes mellitus with diabetic nephropathy: Secondary | ICD-10-CM

## 2019-02-14 DIAGNOSIS — N1831 Chronic kidney disease, stage 3a: Secondary | ICD-10-CM | POA: Diagnosis not present

## 2019-02-14 DIAGNOSIS — I5032 Chronic diastolic (congestive) heart failure: Secondary | ICD-10-CM | POA: Diagnosis not present

## 2019-02-14 NOTE — Patient Instructions (Signed)
Medication Instructions:  No changes *If you need a refill on your cardiac medications before your next appointment, please call your pharmacy*  Lab Work: None ordered If you have labs (blood work) drawn today and your tests are completely normal, you will receive your results only by: [name] MyChart Message (if you have MyChart) OR . A paper copy in the mail If you have any lab test that is abnormal or we need to change your treatment, we will call you to review the results.  Testing/Procedures: None ordered  Follow-Up: At [facility], you and your health needs are our priority.  As part of our continuing mission to provide you with exceptional heart care, we have created designated Provider Care Teams.  These Care Teams include your primary Cardiologist (physician) and Advanced Practice Providers (APPs -  Physician Assistants and Nurse Practitioners) who all work together to provide you with the care you need, when you need it.  Your next appointment:   1 month(s)  The format for your next appointment:   In Person  Provider:   You may see Sanda Klein, MD or one of the following Advanced Practice Providers on your designated Care Team:    Almyra Deforest, PA-C  Fabian Sharp, PA-C or   Roby Lofts, [place]

## 2019-02-14 NOTE — Telephone Encounter (Signed)
Called pt to make f/u appt w/ app/np. Pt did not answer / pt does not have VM , I will try back later

## 2019-02-15 ENCOUNTER — Ambulatory Visit (INDEPENDENT_AMBULATORY_CARE_PROVIDER_SITE_OTHER): Payer: Medicare HMO | Admitting: *Deleted

## 2019-02-15 DIAGNOSIS — I442 Atrioventricular block, complete: Secondary | ICD-10-CM | POA: Diagnosis not present

## 2019-02-15 LAB — CUP PACEART REMOTE DEVICE CHECK
Battery Remaining Longevity: 42 mo
Battery Remaining Percentage: 57 %
Brady Statistic RA Percent Paced: 0 %
Brady Statistic RV Percent Paced: 99 %
Date Time Interrogation Session: 20210105003600
Implantable Lead Implant Date: 20080923
Implantable Lead Implant Date: 20080923
Implantable Lead Implant Date: 20080923
Implantable Lead Location: 753858
Implantable Lead Location: 753859
Implantable Lead Location: 753860
Implantable Lead Model: 4285
Implantable Lead Model: 4555
Implantable Lead Model: 7120
Implantable Lead Serial Number: 170220
Implantable Lead Serial Number: 486468
Implantable Pulse Generator Implant Date: 20140923
Lead Channel Impedance Value: 1143 Ohm
Lead Channel Impedance Value: 656 Ohm
Lead Channel Impedance Value: 728 Ohm
Lead Channel Pacing Threshold Amplitude: 0.7 V
Lead Channel Pacing Threshold Amplitude: 2.4 V
Lead Channel Pacing Threshold Pulse Width: 0.4 ms
Lead Channel Pacing Threshold Pulse Width: 2 ms
Lead Channel Setting Pacing Amplitude: 2 V
Lead Channel Setting Pacing Amplitude: 3 V
Lead Channel Setting Pacing Pulse Width: 0.4 ms
Lead Channel Setting Pacing Pulse Width: 2 ms
Lead Channel Setting Sensing Sensitivity: 2.5 mV
Lead Channel Setting Sensing Sensitivity: 2.5 mV
Pulse Gen Serial Number: 100731

## 2019-02-15 LAB — BASIC METABOLIC PANEL
BUN/Creatinine Ratio: 16 (ref 12–28)
BUN: 20 mg/dL (ref 8–27)
CO2: 30 mmol/L — ABNORMAL HIGH (ref 20–29)
Calcium: 8.7 mg/dL (ref 8.7–10.3)
Chloride: 99 mmol/L (ref 96–106)
Creatinine, Ser: 1.23 mg/dL — ABNORMAL HIGH (ref 0.57–1.00)
GFR calc Af Amer: 50 mL/min/{1.73_m2} — ABNORMAL LOW (ref >59)
GFR calc non Af Amer: 43 mL/min/{1.73_m2} — ABNORMAL LOW (ref >59)
Glucose: 213 mg/dL — ABNORMAL HIGH (ref 65–99)
Potassium: 4 mmol/L (ref 3.5–5.2)
Sodium: 142 mmol/L (ref 134–144)

## 2019-02-16 NOTE — Progress Notes (Signed)
Patient ID: [name], female   DOB: 06-Apr-1945, 74 y.o.   MRN: [id number] .    Cardiology Office Note    Date:  02/16/2019   ID:  [name], DOB 1945-05-08, MRN [id number]  PCP:  [facility]  Cardiologist:   Sanda Klein, MD   Chief Complaint  Patient presents with  . Follow-up    History of Present Illness:  [name] is a 74 y.o. female with CAD s/p CABG October 2016 (LIMA to LAD, SVG to D1, SVG to OM1 and OM2, SVG to PDA), diastolic heart failure, morbid obesity, adrenal insufficiency, type 2 diabetes mellitus, Hypertension, stage III chronic kidney disease, gout, obstructive sleep apnea, asthma. Nonischemic cardiomyopathy and heart failure preceded the diagnosis of coronary disease. She has complete heart block and had a conventional pacemaker implanted in 2001. This was upgraded to a biventricular ICD implanted in 2008 for severe cardiomyopathy with ejection fraction 20%, downgraded to CRT-P in 2014 ([place]) following excellent response to resynchronization (hyper-responder, normalization of left ventricular systolic function). The current atrial and right ventricular leads were implanted in 2001, the left ventricular lead was implanted in 2008. The defibrillator lead implanted in 2008 is capped and its tip was resected at the time of bypass surgery. Ever since bypass surgery, atrial pacing thresholds have been extremely high, so her device is programmed VDD.   Developed symptoms of heart failure again in the fall of 2020 and echocardiogram 01/18/2019 showed EF had deteriorated, down to 40-45% (had been normal in 2017) and left atrial pressures were increased.  On 1217 she was seen in the clinic and it was noted that she had lost left ventricular capture.  The output was increased in the coronary sinus lead.  Her ECG today shows positive R waves in lead V1, consistent with good LV capture.  She appears to have effective biventricular  pacing now.  Labs showed marked worsening of renal function and her diuretics were decreased.  Repeat labs performed today show marked improvement in renal parameters.  She reports still feeling short of breath and is still wearing her oxygen, but she is now able to speak without interrupted sentences.  Still feels weak, but no longer dizzy or presyncopal.  Blood pressure is now normal.  She has not had chest pain or leg edema.  She has obstructive sleep apnea but is intolerant of the CPAP mask.  Pacemaker interrogation shows device function.    Her [employer] CRT-P device was implanted in 2014 and is programmed VDD due to extremely high atrial pacing thresholds (ever since she had bypass surgery).  Current generator longevity is estimated at 3.5 years.  She has 99% biventricular pacing.  LV lead is programmed LV tip-LV ring.  No atrial fibrillation is seen. No VT.  In the past we have estimated her dry weight to be around 248 pounds.  She was clearly hypovolemic at 238 with hypotension and worsening renal function.  Today she weighs 252 pounds and does not have any edema.  Past Medical History:  Diagnosis Date  . AICD (automatic cardioverter/defibrillator) present   . Anemia   . Arthritis   . Asthma   . CAD (coronary artery disease)    a. 10/09/16 LHC: SVG->LAD patent, SVG->Diag patent, SVG->RCA patent, SVG->LCx occluded. EF 60%, b. 10/31/16 LHC DES to AV groove Circ, DES to intermed branch  . Cellulitis and abscess of foot 12/2014   RT FOOT  . CHF (congestive heart  failure) ([place])   . Chronic bronchitis ([place])   . Chronic lower back pain   . Diverticulosis   . Facial numbness 02/2016  . Fatty liver disease, nonalcoholic   . Gastritis   . GERD (gastroesophageal reflux disease)   . Gout   . H/O hiatal hernia   . High cholesterol   . Hyperplastic colon polyp   . Hypertension   . IBS (irritable bowel syndrome)   . Internal hemorrhoids   . Ischemic cardiomyopathy   . Obesity    . On home oxygen therapy    "2L at night" (10/31/2016)  . OSA (obstructive sleep apnea)    "can't tolerate a mask" (10/30/2016)  . Pneumonia    "couple times" (10/31/2016)  . Shortness of breath   . TIA (transient ischemic attack)    "recently" (10/31/2016)  . Type II diabetes mellitus ([place])     Past Surgical History:  Procedure Laterality Date  . ABDOMINAL ULTRASOUND  12/01/2011   Peripelvic cysts- #1- 2.4x1.9x2.3cm, #2-1.2x0.9x1.2cm  . ANKLE FRACTURE SURGERY Right    "had rod put in"  . ANTERIOR CERVICAL DECOMP/DISCECTOMY FUSION    . APPENDECTOMY    . BACK SURGERY    . BIOPSY  12/29/2017   Procedure: BIOPSY;  Surgeon: [name], MD;  Location: [facility];  Service: Endoscopy;;  . BIV ICD INSERTION CRT-D  2001?  [name] BIV PACEMAKER GENERATOR CHANGE OUT N/A 11/02/2012   Procedure: BIV PACEMAKER GENERATOR CHANGE OUT;  Surgeon: Sanda Klein, MD;  Location: [place] [facility];  Service: Cardiovascular;  Laterality: N/A;  . CARDIAC CATHETERIZATION  05/17/1999   No significant coronary obstructive disease w/ mild 20% luminal irregularity of the first diag branch of the LAD  . CARDIAC CATHETERIZATION  07/08/2002   No significant CAD, moderately depressed LV systolic function  . CARDIAC CATHETERIZATION Bilateral 04/26/2007   Normal findings, recommend medical therapy  . CARDIAC CATHETERIZATION  02/18/2008   Moderate CAD, would benefit from having a functional study, recommend continue medical therapy  . CARDIAC CATHETERIZATION  07/23/2012   Medical therapy  . CARDIAC CATHETERIZATION N/A 11/24/2014   Procedure: Left Heart Cath and Coronary Angiography;  Surgeon: Troy Sine, MD;  Location: [place] CV LAB;  Service: Cardiovascular;  Laterality: N/A;  . CARDIAC CATHETERIZATION  11/27/2014   Procedure: Intravascular Pressure Wire/FFR Study;  Surgeon: Peter M Martinique, MD;  Location: [place] [facility];  Service: Cardiovascular;;  . CARDIAC CATHETERIZATION  10/09/2016  . CHOLECYSTECTOMY  N/A 04/09/2018   Procedure: LAPAROSCOPIC CHOLECYSTECTOMY;  Surgeon: Greer Pickerel, MD;  [facility]: [facility];  Service: General;  Laterality: N/A;  . COLONOSCOPY WITH PROPOFOL N/A 12/29/2017   Procedure: COLONOSCOPY WITH PROPOFOL;  Surgeon: [name], MD;  [facility];  Service: Endoscopy;  Laterality: N/A;  . CORONARY ANGIOGRAM  2010  . CORONARY ARTERY BYPASS GRAFT N/A 11/29/2014   Procedure: CORONARY ARTERY BYPASS GRAFTING x 5 (LIMA-LAD, SVG-D, SVG-OM1-OM2, SVG-PD);  Surgeon: Melrose Nakayama, MD;  Location: [facility];  Service: Open Heart Surgery;  Laterality: N/A;  . CORONARY STENT INTERVENTION N/A 10/31/2016   Procedure: CORONARY STENT INTERVENTION;  Surgeon: Burnell Blanks, MD;  Location: [place] [facility];  Service: Cardiovascular;  Laterality: N/A;  . ESOPHAGOGASTRODUODENOSCOPY (EGD) WITH PROPOFOL N/A 12/29/2017   Procedure: ESOPHAGOGASTRODUODENOSCOPY (EGD) WITH PROPOFOL;  Surgeon: [name], MD;  [facility];  Service: Endoscopy;  Laterality: N/A;  . FRACTURE SURGERY    . INSERT / REPLACE / [place]  .  KNEE ARTHROSCOPY Bilateral   . LEFT HEART CATH AND CORS/GRAFTS ANGIOGRAPHY N/A 12/09/2017   Procedure: LEFT HEART CATH AND CORS/GRAFTS ANGIOGRAPHY;  Surgeon: Troy Sine, MD;  Location: [place] [facility];  Service: Cardiovascular;  Laterality: N/A;  . LEFT HEART CATHETERIZATION WITH CORONARY ANGIOGRAM N/A 07/23/2012   Procedure: LEFT HEART CATHETERIZATION WITH CORONARY ANGIOGRAM;  Surgeon: Leonie Man, MD;  Location: [facility];  Service: Cardiovascular;  Laterality: N/A;  Carlton Adam MYOVIEW  11/14/2011   Mild-moderate defect seen in Mid Inferolateral and Mid Anterolateral regions-consistant w/ infarct/scar. No significant ischemia demonstrated.  [name] POLYPECTOMY  12/29/2017   Procedure: POLYPECTOMY;  Surgeon: [name], MD;  [facility];  Service: Endoscopy;;  . RIGHT/LEFT HEART CATH AND CORONARY  ANGIOGRAPHY N/A 10/09/2016   Procedure: RIGHT/LEFT HEART CATH AND CORONARY ANGIOGRAPHY;  Surgeon: Jolaine Artist, MD;  Location: [place] [facility];  Service: Cardiovascular;  Laterality: N/A;  . TEE WITHOUT CARDIOVERSION N/A 11/29/2014   Procedure: TRANSESOPHAGEAL ECHOCARDIOGRAM (TEE);  Surgeon: Melrose Nakayama, MD;  Location: [place] [facility];  Service: Open Heart Surgery;  Laterality: N/A;  . TRANSTHORACIC ECHOCARDIOGRAM  07/23/2012   EF 55-60%, normal-mild  . TUBAL LIGATION      Current Medications: Outpatient Medications Prior to Visit  Medication Sig Dispense Refill  . acetaminophen (TYLENOL) 500 MG tablet Take 1,000 mg by mouth every 4 (four) hours as needed for headache (pain).     [name] albuterol (PROVENTIL HFA;VENTOLIN HFA) 108 (90 Base) MCG/ACT inhaler Inhale 2 puffs into the lungs every 6 (six) hours as needed for wheezing or shortness of breath. 1 Inhaler 0  . albuterol (PROVENTIL) (2.5 MG/3ML) 0.083% nebulizer solution Take 3 mLs (2.5 mg total) by nebulization every 6 (six) hours as needed for wheezing or shortness of breath. 75 mL 0  . aspirin EC 81 MG tablet Take 1 tablet (81 mg total) by mouth daily. 90 tablet 3  . bisoprolol (ZEBETA) 5 MG tablet TAKE 1 TABLET EVERY DAY 90 tablet 2  . budesonide-formoterol (SYMBICORT) 160-4.5 MCG/ACT inhaler Inhale 2 puffs into the lungs 2 (two) times daily.    . cetirizine (ZYRTEC) 10 MG tablet Take 10 mg by mouth daily.     . clopidogrel (PLAVIX) 75 MG tablet Take 1 tablet (75 mg total) by mouth daily. 90 tablet 3  . clotrimazole (LOTRIMIN) 1 % cream Apply 1 application topically 2 (two) times daily as needed (rash).    . clotrimazole (MYCELEX) 10 MG troche Take 10 mg by mouth daily as needed ([place]).     . colchicine 0.6 MG tablet Take 0.6 mg by mouth 2 (two) times daily as needed (gout flare).     . cycloSPORINE (RESTASIS) 0.05 % ophthalmic emulsion Place 1 drop into both eyes 2 (two) times daily.    . diclofenac sodium (VOLTAREN) 1 % GEL  Apply 2 g topically 4 (four) times daily. 1 Tube 0  . diphenhydrAMINE (BENADRYL) 25 MG tablet Take 25 mg by mouth every 6 (six) hours as needed for allergies.    [name] EPINEPHrine (EPIPEN 2-PAK) 0.3 mg/0.3 mL IJ SOAJ injection Inject 0.3 mg into the muscle once as needed (severe allergic reaction).    . febuxostat (ULORIC) 40 MG tablet Take 40 mg by mouth daily.  3  . GLUCOSAMINE-CHONDROITIN PO Take 1 tablet by mouth daily.    [name] guaiFENesin (MUCINEX) 600 MG 12 hr tablet Take 600 mg by mouth at bedtime as needed for cough or to loosen phlegm.     [name]  hydrocortisone 2.5 % cream Apply 1 application topically daily as needed (itching).   1  . hydroxypropyl methylcellulose / hypromellose (ISOPTO TEARS / GONIOVISC) 2.5 % ophthalmic solution Place 1 drop into both eyes 3 (three) times daily.     [name] ipratropium (ATROVENT) 0.02 % nebulizer solution Take 2.5 mLs (0.5 mg total) by nebulization 4 (four) times daily. 75 mL 0  . isosorbide mononitrate (IMDUR) 30 MG 24 hr tablet Take 1 tablet (30 mg total) by mouth daily.    [name] levofloxacin (LEVAQUIN) 500 MG tablet Take 500 mg by mouth daily.    . metolazone (ZAROXOLYN) 2.5 MG tablet Take 1 tablet (2.5 mg total) by mouth daily as needed (for weight gain greater than 3 pounds). Take one tablet on Monday and one tablet on Thursday.    . Multiple Vitamin (MULTIVITAMIN WITH MINERALS) TABS tablet Take 1 tablet by mouth daily.    . ondansetron (ZOFRAN ODT) 4 MG disintegrating tablet Take 1 tablet (4 mg total) by mouth every 8 (eight) hours as needed for nausea or vomiting. 20 tablet 0  . OXYGEN Place 3 L into the nose continuous.     . polyethylene glycol (MIRALAX / GLYCOLAX) 17 g packet Take 17 g by mouth daily.    . potassium chloride SA (KLOR-CON) 20 MEQ tablet Take 1 tablet (20 mEq total) by mouth 2 (two) times daily. 90 tablet 3  . rosuvastatin (CRESTOR) 20 MG tablet TAKE 1 TABLET AT BEDTIME 90 tablet 1  . saxagliptin HCl (ONGLYZA) 2.5 MG TABS tablet Take 2.5 mg by mouth  daily.     [name] torsemide (DEMADEX) 20 MG tablet Take 3 tablets (60 mg total) by mouth daily. 90 tablet 3  . triamcinolone cream (KENALOG) 0.1 % Apply 1 application topically 2 (two) times daily as needed (itching).     . Vitamin D, Ergocalciferol, (DRISDOL) 1.25 MG (50000 UT) CAPS capsule Take 1 capsule (50,000 Units total) by mouth every 7 (seven) days. 12 capsule 0  . nitroGLYCERIN (NITROSTAT) 0.4 MG SL tablet Place 1 tablet (0.4 mg total) under the tongue every 5 (five) minutes as needed for chest pain. 25 tablet 3   No facility-administered medications prior to visit.     Allergies:   Ivp dye [iodinated diagnostic agents], Shellfish allergy, Shellfish-derived products, Sulfa antibiotics, Iodine, Atorvastatin, Doxycycline, Cephalexin, and Zithromax [azithromycin]   Social History   Socioeconomic History  . Marital status: Widowed    Spouse name: Not on file  . Number of children: 5  . Years of education: Not on file  . Highest education level: Not on file  Occupational History  . Occupation: STAFF/BUFFET    Employer: [employer]  Tobacco Use  . Smoking status: Former Smoker    Packs/day: 0.25    Years: 3.00    Pack years: 0.75    Types: Cigarettes    Quit date: 02/11/1968    Years since quitting: 51.0  . Smokeless tobacco: Never Used  Substance and Sexual Activity  . Alcohol use: No  . Drug use: No  . Sexual activity: Never  Other Topics Concern  . Not on file  Social History Narrative   Widowed last year. She lives with her two sons.   Social Determinants of Health   Financial Resource Strain:   . Difficulty of Paying Living Expenses: Not on file  Food Insecurity:   . Worried About Charity fundraiser in the Last Year: Not on file  . Ran Out of Food  in the Last Year: Not on file  Transportation Needs:   . Lack of Transportation (Medical): Not on file  . Lack of Transportation (Non-Medical): Not on file  Physical Activity:   . Days of Exercise per Week:  Not on file  . Minutes of Exercise per Session: Not on file  Stress:   . Feeling of Stress : Not on file  Social Connections:   . Frequency of Communication with Friends and Family: Not on file  . Frequency of Social Gatherings with Friends and Family: Not on file  . Attends Religious Services: Not on file  . Active Member of Clubs or Organizations: Not on file  . Attends Archivist Meetings: Not on file  . Marital Status: Not on file     Family History:  The patient's family history includes Asthma in her sister; Breast cancer in her mother; Diabetes in her maternal grandmother and mother; Glaucoma in her maternal aunt; Heart disease in her maternal aunt and maternal grandmother; Kidney disease in her maternal grandmother.   ROS:   Please see the history of present illness.    ROS All other systems are reviewed and are negative.  PHYSICAL EXAM:   VS:  BP 130/66 (BP Location: Left Arm, Patient Position: Sitting, Cuff Size: Large)   Pulse 72   Temp (!) 96.4 F (35.8 C)   Ht 5\' 4"  (1.626 m)   Wt 249 lb (112.9 kg)   BMI 42.74 kg/m       General: Alert, oriented x3, no distress, morbidly obese Head: no evidence of trauma, PERRL, EOMI, no exophtalmos or lid lag, no myxedema, no xanthelasma; normal ears, nose and oropharynx Neck: normal jugular venous pulsations and no hepatojugular reflux; brisk carotid pulses without delay and no carotid bruits Chest: clear to auscultation, no signs of consolidation by percussion or palpation, normal fremitus, symmetrical and full respiratory excursions Cardiovascular: normal position and quality of the apical impulse, regular rhythm, normal first and second heart sounds, no murmurs, rubs or gallops Abdomen: no tenderness or distention, no masses by palpation, no abnormal pulsatility or arterial bruits, normal bowel sounds, no hepatosplenomegaly Extremities: no clubbing, cyanosis or edema; 2+ radial, ulnar and brachial pulses bilaterally;  2+ right femoral, posterior tibial and dorsalis pedis pulses; 2+ left femoral, posterior tibial and dorsalis pedis pulses; no subclavian or femoral bruits Neurological: grossly nonfocal Psych: Normal mood and affect   Wt Readings from Last 3 Encounters:  02/14/19 249 lb (112.9 kg)  01/27/19 252 lb (114.3 kg)  12/27/18 255 lb (115.7 kg)      Studies/Labs Reviewed:   EKG:  EKG is ordered today.  It shows atrial sensed, ventricular paced rhythm with a very prominent positive R wave in lead V1, consistent with effective LV capture  Recent Labs: 05/01/2018: ALT 35; B Natriuretic Peptide 142.7 10/10/2018: Hemoglobin 11.7; Platelets 137 01/21/2019: NT-Pro BNP 929 02/14/2019: BUN 20; Creatinine, Ser 1.23; Potassium 4.0; Sodium 142   Lipid Panel    Component Value Date/Time   CHOL 186 01/21/2019 1045   TRIG 254 (H) 01/21/2019 1045   HDL 44 01/21/2019 1045   CHOLHDL 4.2 01/21/2019 1045   CHOLHDL 4.3 10/31/2016 0314   VLDL 36 10/31/2016 0314   LDLCALC 99 01/21/2019 1045   08/18/2018 Total cholesterol 193, triglycerides 187, HDL 52, LDL 103 Hemoglobin A1c 6.9% 11/25/2018  creatinine 2.13  ASSESSMENT:    1. Chronic combined systolic and diastolic heart failure ([place])   2. Coronary artery disease involving native heart  without angina pectoris, unspecified vessel or lesion type   3. CHB (complete heart block) (H[facility])   4. Status post biventricular pacemaker   5. OSA (obstructive sleep apnea)   6. Morbid (severe) obesity due to excess calories ([place])   7. Mixed hypercholesterolemia and hypertriglyceridemia   8. Type 2 diabetes mellitus with stage 3a chronic kidney disease, without long-term current use of insulin (H[facility])   9. Stage 3a chronic kidney disease      PLAN:  In order of problems listed above:  1. CHF: As before, her physical exam makes assessment of volume status fairly challenging.  She has reactive airway disease and morbid obesity which could also explain dyspnea.  It  seemed that she had heart failure exacerbation due to loss of LV capture, which is now corrected.  It may take a few more weeks to see the full benefit of reestablishing CRT.  She does not appear to be hypervolemic.  We may still need to pursue right and left heart catheterization if her symptoms do not improve. 2. CAD s/p CABG: She has not had angina.  On lifelong dual antiplatelet therapy.  Receiving statin.  On highly selective beta-blocker due to reactive airway disease. 3. CHB: Pacemaker dependent with 100% biventricular pacing 4. CRT-P: Probably lost LV capture for a few months, leading to her decompensation.  Has effective LV pacing now.  Device is programmed VDD. The current right atrial lead and right ventricular lead were placed in 2001. The current left ventricular lead was placed in 2008. Consider replacement of the right atrial lead at the time of her next generator change out.   5. OSA: Intolerant of CPAP; pulmonary hypertension due to sleep apnea and obesity hypoventilation syndrome are likely contributing to a large degree to her shortness of breath.  She also has significant reactive airway disease and has periodically required steroid therapy. 6. Morbid obesity: Makes diagnosis and management of her cardiac problems more difficult. 7. HLP: Despite a highly active statin, her LDL cholesterol remains above target.  Consider adding Zetia. 8. DM: Fair, albeit borderline control hemoglobin A1c 7% in February 2020. 9. CKD 3: Baseline creatinine around 1.4 (GFR 40-45).  Recent marked worsening of creatinine to a peak of 2.19, probably due to worsening cardiac output and attempts at excessive diuresis to improve her symptoms.  Today creatinine back down to 1.23  Etiology of her dyspnea is multifactorial, including morbid obesity and restrictive physiology, asthma, pulmonary hypertension due to obesity and obstructive sleep apnea as well as heart failure    Medication Adjustments/Labs and  Tests Ordered: Current medicines are reviewed at length with the patient today.  Concerns regarding medicines are outlined above.  Medication changes, Labs and Tests ordered today are listed in the Patient Instructions below. Patient Instructions  Medication Instructions:  No changes *If you need a refill on your cardiac medications before your next appointment, please call your pharmacy*  Lab Work: None ordered If you have labs (blood work) drawn today and your tests are completely normal, you will receive your results only by: [name] MyChart Message (if you have MyChart) OR . A paper copy in the mail If you have any lab test that is abnormal or we need to change your treatment, we will call you to review the results.  Testing/Procedures: None ordered  Follow-Up: At [facility], you and your health needs are our priority.  As part of our continuing mission to provide you with exceptional heart care, we have created designated  Provider Care Teams.  These Care Teams include your primary Cardiologist (physician) and Advanced Practice Providers (APPs -  Physician Assistants and Nurse Practitioners) who all work together to provide you with the care you need, when you need it.  Your next appointment:   1 month(s)  The format for your next appointment:   In Person  Provider:   You may see Sanda Klein, MD or one of the following Advanced Practice Providers on your designated Care Team:    Almyra Deforest, PA-C  Fabian Sharp, [place] or   Roby Lofts, PA-C      Signed, Sanda Klein, MD  02/16/2019 5:57 PM    [address], [place], [place]  [address] Phone: [phone]; Fax: [phone]

## 2019-02-17 DIAGNOSIS — I10 Essential (primary) hypertension: Secondary | ICD-10-CM | POA: Diagnosis not present

## 2019-02-17 DIAGNOSIS — E1169 Type 2 diabetes mellitus with other specified complication: Secondary | ICD-10-CM | POA: Diagnosis not present

## 2019-02-21 ENCOUNTER — Encounter: Payer: Medicare HMO | Admitting: Cardiovascular Disease

## 2019-02-21 ENCOUNTER — Telehealth: Payer: Self-pay | Admitting: Cardiovascular Disease

## 2019-02-21 NOTE — Telephone Encounter (Signed)
Returned the call to the patient. She stated that starting Saturday, she has had some swelling in her left leg. This has now progressed to her right leg. She stated that her shortness of breath has gotten worse and that she has gained 9 pounds.   She currently takes Torsemide 60 mg and has Metolazone as needed. She stated that she took the Metolazone an hour ago. She has been advised that the Metolazone should start working soon and has been advised to call back tomorrow with an update but if she does get worse tonight to call the on call provider for further instructions.

## 2019-02-21 NOTE — Telephone Encounter (Signed)
New message   Pt c/o swelling: STAT is pt has developed SOB within 24 hours  1) How much weight have you gained and in what time span? 9lbs in last couple of days  If swelling, where is the swelling located? Both legs  2) Are you currently taking a fluid pill? Yes   3) Are you currently SOB? Yes   4) Do you have a log of your daily weights (if so, list)? Yes   5) Have you gained 3 pounds in a day or 5 pounds in a week? Yes   6) Have you traveled recently?no

## 2019-02-22 NOTE — Telephone Encounter (Signed)
We nned to talk to her again in another 24h and get weight then. If she has lost all 9 lb (I.e. another 5 lb negative), no need for more metolazone.

## 2019-02-22 NOTE — Telephone Encounter (Signed)
Returned the call to the patient. She feels like she is a little better but her legs are still swollen and red. She has started to urinate more and has lost 4 pounds overnight of the 9 she had gained.   She took her 60 mg of the torsemide and one metolazone yesterday. Today she has taken her 60 mg of the torsemide.

## 2019-02-22 NOTE — Telephone Encounter (Signed)
Follow up    Pt is returning call for [facility]    Please call back

## 2019-02-22 NOTE — Telephone Encounter (Signed)
The patient has been made aware and will call back tomorrow with an update.

## 2019-02-23 DIAGNOSIS — E1122 Type 2 diabetes mellitus with diabetic chronic kidney disease: Secondary | ICD-10-CM | POA: Diagnosis not present

## 2019-02-23 DIAGNOSIS — E1169 Type 2 diabetes mellitus with other specified complication: Secondary | ICD-10-CM | POA: Diagnosis not present

## 2019-02-23 DIAGNOSIS — J4520 Mild intermittent asthma, uncomplicated: Secondary | ICD-10-CM | POA: Diagnosis not present

## 2019-02-23 DIAGNOSIS — E7800 Pure hypercholesterolemia, unspecified: Secondary | ICD-10-CM | POA: Diagnosis not present

## 2019-02-23 DIAGNOSIS — I2510 Atherosclerotic heart disease of native coronary artery without angina pectoris: Secondary | ICD-10-CM | POA: Diagnosis not present

## 2019-02-23 DIAGNOSIS — I5033 Acute on chronic diastolic (congestive) heart failure: Secondary | ICD-10-CM | POA: Diagnosis not present

## 2019-02-23 DIAGNOSIS — I10 Essential (primary) hypertension: Secondary | ICD-10-CM | POA: Diagnosis not present

## 2019-02-23 DIAGNOSIS — I5032 Chronic diastolic (congestive) heart failure: Secondary | ICD-10-CM | POA: Diagnosis not present

## 2019-03-02 ENCOUNTER — Ambulatory Visit (HOSPITAL_COMMUNITY)
Admission: RE | Admit: 2019-03-02 | Discharge: 2019-03-02 | Disposition: A | Discharge status: Home / Self Care | Payer: Medicare HMO | Source: Ambulatory Visit | Attending: Internal Medicine | Admitting: Internal Medicine

## 2019-03-02 ENCOUNTER — Encounter (HOSPITAL_COMMUNITY): Payer: Self-pay | Admitting: Internal Medicine

## 2019-03-02 ENCOUNTER — Other Ambulatory Visit: Payer: Self-pay

## 2019-03-02 VITALS — BP 138/84 | HR 78 | Wt 257.6 lb

## 2019-03-02 DIAGNOSIS — Z7951 Long term (current) use of inhaled steroids: Secondary | ICD-10-CM | POA: Diagnosis not present

## 2019-03-02 DIAGNOSIS — Z79899 Other long term (current) drug therapy: Secondary | ICD-10-CM | POA: Insufficient documentation

## 2019-03-02 DIAGNOSIS — N183 Chronic kidney disease, stage 3 unspecified: Secondary | ICD-10-CM | POA: Diagnosis not present

## 2019-03-02 DIAGNOSIS — Z7984 Long term (current) use of oral hypoglycemic drugs: Secondary | ICD-10-CM | POA: Diagnosis not present

## 2019-03-02 DIAGNOSIS — Z8673 Personal history of transient ischemic attack (TIA), and cerebral infarction without residual deficits: Secondary | ICD-10-CM | POA: Insufficient documentation

## 2019-03-02 DIAGNOSIS — J9610 Chronic respiratory failure, unspecified whether with hypoxia or hypercapnia: Secondary | ICD-10-CM | POA: Insufficient documentation

## 2019-03-02 DIAGNOSIS — M1990 Unspecified osteoarthritis, unspecified site: Secondary | ICD-10-CM | POA: Insufficient documentation

## 2019-03-02 DIAGNOSIS — M109 Gout, unspecified: Secondary | ICD-10-CM | POA: Insufficient documentation

## 2019-03-02 DIAGNOSIS — G4733 Obstructive sleep apnea (adult) (pediatric): Secondary | ICD-10-CM | POA: Diagnosis not present

## 2019-03-02 DIAGNOSIS — Z803 Family history of malignant neoplasm of breast: Secondary | ICD-10-CM | POA: Insufficient documentation

## 2019-03-02 DIAGNOSIS — Z888 Allergy status to other drugs, medicaments and biological substances status: Secondary | ICD-10-CM | POA: Insufficient documentation

## 2019-03-02 DIAGNOSIS — Z7982 Long term (current) use of aspirin: Secondary | ICD-10-CM | POA: Insufficient documentation

## 2019-03-02 DIAGNOSIS — E1122 Type 2 diabetes mellitus with diabetic chronic kidney disease: Secondary | ICD-10-CM | POA: Diagnosis not present

## 2019-03-02 DIAGNOSIS — K219 Gastro-esophageal reflux disease without esophagitis: Secondary | ICD-10-CM | POA: Insufficient documentation

## 2019-03-02 DIAGNOSIS — Z87891 Personal history of nicotine dependence: Secondary | ICD-10-CM | POA: Insufficient documentation

## 2019-03-02 DIAGNOSIS — K589 Irritable bowel syndrome without diarrhea: Secondary | ICD-10-CM | POA: Diagnosis not present

## 2019-03-02 DIAGNOSIS — Z833 Family history of diabetes mellitus: Secondary | ICD-10-CM | POA: Insufficient documentation

## 2019-03-02 DIAGNOSIS — Z6841 Body Mass Index (BMI) 40.0 and over, adult: Secondary | ICD-10-CM | POA: Diagnosis not present

## 2019-03-02 DIAGNOSIS — Z882 Allergy status to sulfonamides status: Secondary | ICD-10-CM | POA: Diagnosis not present

## 2019-03-02 DIAGNOSIS — Z7902 Long term (current) use of antithrombotics/antiplatelets: Secondary | ICD-10-CM | POA: Insufficient documentation

## 2019-03-02 DIAGNOSIS — Z95810 Presence of automatic (implantable) cardiac defibrillator: Secondary | ICD-10-CM | POA: Diagnosis not present

## 2019-03-02 DIAGNOSIS — Z951 Presence of aortocoronary bypass graft: Secondary | ICD-10-CM | POA: Insufficient documentation

## 2019-03-02 DIAGNOSIS — I130 Hypertensive heart and chronic kidney disease with heart failure and stage 1 through stage 4 chronic kidney disease, or unspecified chronic kidney disease: Secondary | ICD-10-CM | POA: Insufficient documentation

## 2019-03-02 DIAGNOSIS — Z841 Family history of disorders of kidney and ureter: Secondary | ICD-10-CM | POA: Insufficient documentation

## 2019-03-02 DIAGNOSIS — I5022 Chronic systolic (congestive) heart failure: Secondary | ICD-10-CM

## 2019-03-02 DIAGNOSIS — K760 Fatty (change of) liver, not elsewhere classified: Secondary | ICD-10-CM | POA: Insufficient documentation

## 2019-03-02 DIAGNOSIS — I5042 Chronic combined systolic (congestive) and diastolic (congestive) heart failure: Secondary | ICD-10-CM | POA: Insufficient documentation

## 2019-03-02 DIAGNOSIS — I2510 Atherosclerotic heart disease of native coronary artery without angina pectoris: Secondary | ICD-10-CM

## 2019-03-02 DIAGNOSIS — Z91041 Radiographic dye allergy status: Secondary | ICD-10-CM | POA: Insufficient documentation

## 2019-03-02 DIAGNOSIS — I5032 Chronic diastolic (congestive) heart failure: Secondary | ICD-10-CM | POA: Diagnosis not present

## 2019-03-02 DIAGNOSIS — J449 Chronic obstructive pulmonary disease, unspecified: Secondary | ICD-10-CM | POA: Insufficient documentation

## 2019-03-02 DIAGNOSIS — Z8249 Family history of ischemic heart disease and other diseases of the circulatory system: Secondary | ICD-10-CM | POA: Insufficient documentation

## 2019-03-02 DIAGNOSIS — E7800 Pure hypercholesterolemia, unspecified: Secondary | ICD-10-CM | POA: Diagnosis not present

## 2019-03-02 DIAGNOSIS — Z881 Allergy status to other antibiotic agents status: Secondary | ICD-10-CM | POA: Insufficient documentation

## 2019-03-02 DIAGNOSIS — Z91013 Allergy to seafood: Secondary | ICD-10-CM | POA: Insufficient documentation

## 2019-03-02 LAB — PROTIME-INR
INR: 0.9 (ref 0.8–1.2)
Prothrombin Time: 11.8 seconds (ref 11.4–15.2)

## 2019-03-02 LAB — BASIC METABOLIC PANEL
Anion gap: 14 (ref 5–15)
BUN: 59 mg/dL — ABNORMAL HIGH (ref 8–23)
CO2: 37 mmol/L — ABNORMAL HIGH (ref 22–32)
Calcium: 8.6 mg/dL — ABNORMAL LOW (ref 8.9–10.3)
Chloride: 87 mmol/L — ABNORMAL LOW (ref 98–111)
Creatinine, Ser: 2.16 mg/dL — ABNORMAL HIGH (ref 0.44–1.00)
GFR calc Af Amer: 25 mL/min — ABNORMAL LOW (ref >60)
GFR calc non Af Amer: 22 mL/min — ABNORMAL LOW (ref >60)
Glucose, Bld: 170 mg/dL — ABNORMAL HIGH (ref 70–99)
Potassium: 2.9 mmol/L — ABNORMAL LOW (ref 3.5–5.1)
Sodium: 138 mmol/L (ref 135–145)

## 2019-03-02 LAB — CBC
HCT: 36.4 % (ref 36.0–46.0)
Hemoglobin: 11.3 g/dL — ABNORMAL LOW (ref 12.0–15.0)
MCH: 29 pg (ref 26.0–34.0)
MCHC: 31 g/dL (ref 30.0–36.0)
MCV: 93.3 fL (ref 80.0–100.0)
Platelets: 176 10*3/uL (ref 150–400)
RBC: 3.9 MIL/uL (ref 3.87–5.11)
RDW: 14.2 % (ref 11.5–15.5)
WBC: 9.1 10*3/uL (ref 4.0–10.5)
nRBC: 0 % (ref 0.0–0.2)

## 2019-03-02 NOTE — Progress Notes (Signed)
ADVANCED HF CLINIC NOTE  Patient ID: [name], female   DOB: 09/18/1945, 74 y.o.   MRN: [id number]  Primary Cardiologist: Dr. Dani Gobble Croitoru Primary HF: Dr. Haroldine Laws   HPI: [name] is a 74 year old with history of morbid obesity, DM, HTN, asthma, OSA, ICD, GERD, arthritis, CRT-D 2008 [employer], CAD, S/P CABG x5 11/29/2014.  Discharged from [facility] 12/07/14 s/p CABG. Discharge weight 252.  Went to [facility].  Readmitted 12/21/14 with RLE cellulitis and volume overload with weight of 273 and complaints of dyspnea and orthopnea. RLE cellulitis treated with IV ABX. She was had brisk diuresis with lasix gtt. Diuresis slowed down with slight Cr bump. Acetazolamide added and monitored closely with history of sulfa allergy. Diuresed well, but lasix held with continued Cr bump. Transitioned to po torsemide for d/c. Discharge weight 246 lbs.  Admitted 1/23 through 03/09/16 with CP. CEs negative.   [name] returns today for HF follow up. She has not beed seen here for nearly 3 years. Follows with Dr. Bonne Dolores. Developed symptoms of heart failure again in the fall of 2020 and echocardiogram 01/18/2019 showed EF had deteriorated, down to 40-45% (had been normal in 2017) and left atrial pressures were increased. She was found to have loss of LV capture and device reprogrammed. Recent creatinine bumped to 2.19 in 12/20. Now back to 1.23 with backing off diuretics. She is here with her son. She says she is doing poorly. Very winded with any activity. Can barely get from room to room. Weight up and down. Drinking lots of sodas and eating ice. Having daily CP. Mild edema.    Studies:  Cath 10/09/16 1. Severe 3v CAD 2. LIMA  to LAD widely patent 3. SVG to Diagonal widely patent 4. SVG to RCA widely patent 5. SVG to LCx and OM occluded ostially.  Underwent to stent to LCX and Ramus 6. LVEF 60% 7. Mild PAH with normal PCWP and output  Underwent PCI 10/31/16:  -Successful PTCA/DES x  3 AV groove circumflex -Successful PTCA/DES x 1 intermediate branch   Findings:  Ao = 117/61 (83) LV = 126/8 RA = 11 RV = 39/10 PA = 37/16 (25) PCW = 13 Fick cardiac output/index = 5.1/2.3 PVR = 2.4 WU FA sat = 95% PA sat = 65%  Assessment: 1. Severe 3v CAD 2. LIMA  to LAD widely patent 3. SVG to Diagonal widely patent 4. SVG to RCA widely patent 5. SVG to LCx and OM occluded ostially 6. LVEF 60% 7. Mild PAH with normal PCWP and output    Echo 11/24/14 EF 55-60%, Grade 1 DD. Mild TR TEE 11/29/14 EF 55-60%, LVH, Mild AR and MR Echo 9/17: EF 55-60% LVH  Labs 02/21/2015: K 4.5 Creatinine 1.39  Labs 07/12/2015: K 4.4 Creatinine 1.14 Hgb 11.5   Past Medical History:  Diagnosis Date  . AICD (automatic cardioverter/defibrillator) present   . Anemia   . Arthritis   . Asthma   . CAD (coronary artery disease)    a. 10/09/16 LHC: SVG->LAD patent, SVG->Diag patent, SVG->RCA patent, SVG->LCx occluded. EF 60%, b. 10/31/16 LHC DES to AV groove Circ, DES to intermed branch  . Cellulitis and abscess of foot 12/2014   RT FOOT  . CHF (congestive heart failure) ([place])   . Chronic bronchitis ([place])   . Chronic lower back pain   . Diverticulosis   . Facial numbness 02/2016  . Fatty liver disease, nonalcoholic   . Gastritis   . GERD (gastroesophageal reflux  disease)   . Gout   . H/O hiatal hernia   . High cholesterol   . Hyperplastic colon polyp   . Hypertension   . IBS (irritable bowel syndrome)   . Internal hemorrhoids   . Ischemic cardiomyopathy   . Obesity   . On home oxygen therapy    "2L at night" (10/31/2016)  . OSA (obstructive sleep apnea)    "can't tolerate a mask" (10/30/2016)  . Pneumonia    "couple times" (10/31/2016)  . Shortness of breath   . TIA (transient ischemic attack)    "recently" (10/31/2016)  . Type II diabetes mellitus ([place])     Current Outpatient Medications  Medication Sig Dispense Refill  . acetaminophen (TYLENOL) 500 MG tablet Take 1,000 mg by  mouth every 4 (four) hours as needed for headache (pain).     [name] albuterol (PROVENTIL HFA;VENTOLIN HFA) 108 (90 Base) MCG/ACT inhaler Inhale 2 puffs into the lungs every 6 (six) hours as needed for wheezing or shortness of breath. 1 Inhaler 0  . albuterol (PROVENTIL) (2.5 MG/3ML) 0.083% nebulizer solution Take 3 mLs (2.5 mg total) by nebulization every 6 (six) hours as needed for wheezing or shortness of breath. 75 mL 0  . aspirin EC 81 MG tablet Take 1 tablet (81 mg total) by mouth daily. 90 tablet 3  . bisoprolol (ZEBETA) 5 MG tablet TAKE 1 TABLET EVERY DAY 90 tablet 2  . budesonide-formoterol (SYMBICORT) 160-4.5 MCG/ACT inhaler Inhale 2 puffs into the lungs 2 (two) times daily.    . cetirizine (ZYRTEC) 10 MG tablet Take 10 mg by mouth daily.     . clopidogrel (PLAVIX) 75 MG tablet Take 1 tablet (75 mg total) by mouth daily. 90 tablet 3  . clotrimazole (LOTRIMIN) 1 % cream Apply 1 application topically 2 (two) times daily as needed (rash).    . clotrimazole (MYCELEX) 10 MG troche Take 10 mg by mouth daily as needed ([place]outh).     . colchicine 0.6 MG tablet Take 0.6 mg by mouth 2 (two) times daily as needed (gout flare).     . cycloSPORINE (RESTASIS) 0.05 % ophthalmic emulsion Place 1 drop into both eyes 2 (two) times daily.    . diclofenac sodium (VOLTAREN) 1 % GEL Apply 2 g topically 4 (four) times daily. 1 Tube 0  . diphenhydrAMINE (BENADRYL) 25 MG tablet Take 25 mg by mouth every 6 (six) hours as needed for allergies.    [name] EPINEPHrine (EPIPEN 2-PAK) 0.3 mg/0.3 mL IJ SOAJ injection Inject 0.3 mg into the muscle once as needed (severe allergic reaction).    . febuxostat (ULORIC) 40 MG tablet Take 40 mg by mouth daily.  3  . GLUCOSAMINE-CHONDROITIN PO Take 1 tablet by mouth daily.    [name] guaiFENesin (MUCINEX) 600 MG 12 hr tablet Take 600 mg by mouth at bedtime as needed for cough or to loosen phlegm.     . hydrocortisone 2.5 % cream Apply 1 application topically daily as needed (itching).   1  .  hydroxypropyl methylcellulose / hypromellose (ISOPTO TEARS / GONIOVISC) 2.5 % ophthalmic solution Place 1 drop into both eyes 3 (three) times daily.     [name] ipratropium (ATROVENT) 0.02 % nebulizer solution Take 2.5 mLs (0.5 mg total) by nebulization 4 (four) times daily. 75 mL 0  . isosorbide mononitrate (IMDUR) 30 MG 24 hr tablet Take 1 tablet (30 mg total) by mouth daily.    [name] levofloxacin (LEVAQUIN) 500 MG tablet Take 500 mg by mouth  daily.    . metolazone (ZAROXOLYN) 2.5 MG tablet Take 1 tablet (2.5 mg total) by mouth daily as needed (for weight gain greater than 3 pounds). Take one tablet on Monday and one tablet on Thursday.    . Multiple Vitamin (MULTIVITAMIN WITH MINERALS) TABS tablet Take 1 tablet by mouth daily.    . nitroGLYCERIN (NITROSTAT) 0.4 MG SL tablet Place 1 tablet (0.4 mg total) under the tongue every 5 (five) minutes as needed for chest pain. 25 tablet 3  . ondansetron (ZOFRAN ODT) 4 MG disintegrating tablet Take 1 tablet (4 mg total) by mouth every 8 (eight) hours as needed for nausea or vomiting. 20 tablet 0  . OXYGEN Place 3 L into the nose continuous.     . polyethylene glycol (MIRALAX / GLYCOLAX) 17 g packet Take 17 g by mouth daily.    . potassium chloride SA (KLOR-CON) 20 MEQ tablet Take 1 tablet (20 mEq total) by mouth 2 (two) times daily. 90 tablet 3  . rosuvastatin (CRESTOR) 20 MG tablet TAKE 1 TABLET AT BEDTIME 90 tablet 1  . saxagliptin HCl (ONGLYZA) 2.5 MG TABS tablet Take 2.5 mg by mouth daily.     [name] torsemide (DEMADEX) 20 MG tablet Take 3 tablets (60 mg total) by mouth daily. 90 tablet 3  . triamcinolone cream (KENALOG) 0.1 % Apply 1 application topically 2 (two) times daily as needed (itching).      No current facility-administered medications for this encounter.    Allergies  Allergen Reactions  . Ivp Dye [Iodinated Diagnostic Agents] Shortness Of Breath    No reaction to PO contrast with non-ionic dye.06-25-2014/rsm  . Shellfish Allergy Anaphylaxis  .  Shellfish-Derived Products Anaphylaxis  . Sulfa Antibiotics Shortness Of Breath  . Iodine Hives  . Atorvastatin Other (See Comments)    Pt states "causes bilateral leg pain/cramps."  . Doxycycline Other (See Comments)    Unknown rxn per pt  . Cephalexin Itching and Rash  . Zithromax [Azithromycin] Rash      Social History   Socioeconomic History  . Marital status: Widowed    Spouse name: Not on file  . Number of children: 5  . Years of education: Not on file  . Highest education level: Not on file  Occupational History  . Occupation: STAFF/BUFFET    Employer: [employer]  Tobacco Use  . Smoking status: Former Smoker    Packs/day: 0.25    Years: 3.00    Pack years: 0.75    Types: Cigarettes    Quit date: 02/11/1968    Years since quitting: 51.0  . Smokeless tobacco: Never Used  Substance and Sexual Activity  . Alcohol use: No  . Drug use: No  . Sexual activity: Never  Other Topics Concern  . Not on file  Social History Narrative   Widowed last year. She lives with her two sons.   Social Determinants of Health   Financial Resource Strain:   . Difficulty of Paying Living Expenses: Not on file  Food Insecurity:   . Worried About Charity fundraiser in the Last Year: Not on file  . Ran Out of Food in the Last Year: Not on file  Transportation Needs:   . Lack of Transportation (Medical): Not on file  . Lack of Transportation (Non-Medical): Not on file  Physical Activity:   . Days of Exercise per Week: Not on file  . Minutes of Exercise per Session: Not on file  Stress:   .  Feeling of Stress : Not on file  Social Connections:   . Frequency of Communication with Friends and Family: Not on file  . Frequency of Social Gatherings with Friends and Family: Not on file  . Attends Religious Services: Not on file  . Active Member of Clubs or Organizations: Not on file  . Attends Archivist Meetings: Not on file  . Marital Status: Not on file    Intimate Partner Violence:   . Fear of Current or Ex-Partner: Not on file  . Emotionally Abused: Not on file  . Physically Abused: Not on file  . Sexually Abused: Not on file      Family History  Problem Relation Age of Onset  . Breast cancer Mother   . Diabetes Mother   . Heart disease Maternal Grandmother   . Kidney disease Maternal Grandmother   . Diabetes Maternal Grandmother   . Glaucoma Maternal Aunt   . Heart disease Maternal Aunt   . Asthma Sister   . Colon cancer Neg Hx   . Stomach cancer Neg Hx   . Pancreatic cancer Neg Hx     Vitals:   03/02/19 1515  BP: 138/84  Pulse: 78  SpO2: 96%  Weight: 116.8 kg (257 lb 9.6 oz)   Wt Readings from Last 3 Encounters:  03/02/19 116.8 kg (257 lb 9.6 oz)  02/14/19 112.9 kg (249 lb)  01/27/19 114.3 kg (252 lb)    Chest Circumfrence 144 cm.   PHYSICAL EXAM: General:  Markedly obese woman sitting in WC on O2. No resp difficulty HEENT: normal Neck: supple. Hard to see JVP - looks mildly elevated Carotids 2+ bilat; no bruits. No lymphadenopathy or thryomegaly appreciated. Cor: PMI nonpalpable Regular rate & rhythm. No rubs, gallops or murmurs. Lungs: clear Abdomen: markedly obese soft, nontender, nondistended. No hepatosplenomegaly. No bruits or masses. Good bowel sounds. Extremities: no cyanosis, clubbing, rash, 1+ edema on left Neuro: alert & orientedx3, cranial nerves grossly intact. moves all 4 extremities w/o difficulty. Affect pleasant   ASSESSMENT & PLAN:  1. Chronic systolic/diastolic HF  - Echo 6/38 EF 55-60%, mild LVH.  - RHC in 8/18 looked good. - Repeat echo 12/20 EF down to 40-45% in setting of loss of LV pacing which has now been restored - Volume status hard to assess on exam given size. Do not feel she is markedly overloaded.  - NYHA IIIb - suspect multifactorial with her weight being major contributor - Will plan R/L cath to further evaluate cardiac cause next week.  - Stressed need to cut intake and  lose weight    2. CAD s/p CABG 11/2014 -  s/p LCX stents in 2018 - now with daily CP - R/L cath as above - Continue statin, bb, aspirin.   3. Morbid obesity - stressed absolute need for weight loss with [place] and more activity   4. H/o heart block s/p BiV pacer/CRT-D [employer] - Followed by Dr. Recardo Evangelist. Recent LV lead reprogrammed  5. Chronic renal failure stage III - BMET today  6. OSA:  - She cannot tolerate CPAP. Will refer to Dr Radford Pax to discuss options (?Inspire device)  7. Chronic respiratory failure - likely OSA/OHS - continue O2 - needs weight loss   Total time spent 45 minutes. Over half that time spent discussing above.    Glori Bickers, MD   3:26 PM

## 2019-03-02 NOTE — Patient Instructions (Signed)
Please see attached sheet for heart cath instructions.  Follow up in 4 months.

## 2019-03-02 NOTE — H&P (View-Only) (Signed)
ADVANCED HF CLINIC NOTE  Patient ID: [name], female   DOB: [birth year], 74 y.o.   MRN: [id number]  Primary Cardiologist: Dr. Dani Gobble Croitoru Primary HF: Dr. Haroldine Laws   HPI: [name] is a 74 year old with history of morbid obesity, DM, HTN, asthma, OSA, ICD, GERD, arthritis, CRT-D 2008 [employer], CAD, S/P CABG x5 11/29/2014.  Discharged from [facility] 12/07/14 s/p CABG. Discharge weight 252.  Went to [facility].  Readmitted 12/21/14 with RLE cellulitis and volume overload with weight of 273 and complaints of dyspnea and orthopnea. RLE cellulitis treated with IV ABX. She was had brisk diuresis with lasix gtt. Diuresis slowed down with slight Cr bump. Acetazolamide added and monitored closely with history of sulfa allergy. Diuresed well, but lasix held with continued Cr bump. Transitioned to po torsemide for d/c. Discharge weight 246 lbs.  Admitted 1/23 through 03/09/16 with CP. CEs negative.   Alicia. [name] returns today for HF follow up. She has not beed seen here for nearly 3 years. Follows with Dr. Bonne Dolores. Developed symptoms of heart failure again in the fall of 2020 and echocardiogram 01/18/2019 showed EF had deteriorated, down to 40-45% (had been normal in 2017) and left atrial pressures were increased. She was found to have loss of LV capture and device reprogrammed. Recent creatinine bumped to 2.19 in 12/20. Now back to 1.23 with backing off diuretics. She is here with her son. She says she is doing poorly. Very winded with any activity. Can barely get from room to room. Weight up and down. Drinking lots of sodas and eating ice. Having daily CP. Mild edema.    Studies:  Cath 10/09/16 1. Severe 3v CAD 2. LIMA  to LAD widely patent 3. SVG to Diagonal widely patent 4. SVG to RCA widely patent 5. SVG to LCx and OM occluded ostially.  Underwent to stent to LCX and Ramus 6. LVEF 60% 7. Mild PAH with normal PCWP and output  Underwent PCI 10/31/16:  -Successful PTCA/DES x  3 AV groove circumflex -Successful PTCA/DES x 1 intermediate branch   Findings:  Ao = 117/61 (83) LV = 126/8 RA = 11 RV = 39/10 PA = 37/16 (25) PCW = 13 Fick cardiac output/index = 5.1/2.3 PVR = 2.4 WU FA sat = 95% PA sat = 65%  Assessment: 1. Severe 3v CAD 2. LIMA  to LAD widely patent 3. SVG to Diagonal widely patent 4. SVG to RCA widely patent 5. SVG to LCx and OM occluded ostially 6. LVEF 60% 7. Mild PAH with normal PCWP and output    Echo 11/24/14 EF 55-60%, Grade 1 DD. Mild TR TEE 11/29/14 EF 55-60%, LVH, Mild AR and MR Echo 9/17: EF 55-60% LVH  Labs 02/21/2015: K 4.5 Creatinine 1.39  Labs 07/12/2015: K 4.4 Creatinine 1.14 Hgb 11.5   Past Medical History:  Diagnosis Date  . AICD (automatic cardioverter/defibrillator) present   . Anemia   . Arthritis   . Asthma   . CAD (coronary artery disease)    a. 10/09/16 LHC: SVG->LAD patent, SVG->Diag patent, SVG->RCA patent, SVG->LCx occluded. EF 60%, b. 10/31/16 LHC DES to AV groove Circ, DES to intermed branch  . Cellulitis and abscess of foot 12/2014   RT FOOT  . CHF (congestive heart failure) ([place])   . Chronic bronchitis ([place])   . Chronic lower back pain   . Diverticulosis   . Facial numbness 02/2016  . Fatty liver disease, nonalcoholic   . Gastritis   . GERD (gastroesophageal reflux  disease)   . Gout   . H/O hiatal hernia   . High cholesterol   . Hyperplastic colon polyp   . Hypertension   . IBS (irritable bowel syndrome)   . Internal hemorrhoids   . Ischemic cardiomyopathy   . Obesity   . On home oxygen therapy    "2L at night" (10/31/2016)  . OSA (obstructive sleep apnea)    "can't tolerate a mask" (10/30/2016)  . Pneumonia    "couple times" (10/31/2016)  . Shortness of breath   . TIA (transient ischemic attack)    "recently" (10/31/2016)  . Type II diabetes mellitus ([place])     Current Outpatient Medications  Medication Sig Dispense Refill  . acetaminophen (TYLENOL) 500 MG tablet Take 1,000 mg by  mouth every 4 (four) hours as needed for headache (pain).     [name] albuterol (PROVENTIL HFA;VENTOLIN HFA) 108 (90 Base) MCG/ACT inhaler Inhale 2 puffs into the lungs every 6 (six) hours as needed for wheezing or shortness of breath. 1 Inhaler 0  . albuterol (PROVENTIL) (2.5 MG/3ML) 0.083% nebulizer solution Take 3 mLs (2.5 mg total) by nebulization every 6 (six) hours as needed for wheezing or shortness of breath. 75 mL 0  . aspirin EC 81 MG tablet Take 1 tablet (81 mg total) by mouth daily. 90 tablet 3  . bisoprolol (ZEBETA) 5 MG tablet TAKE 1 TABLET EVERY DAY 90 tablet 2  . budesonide-formoterol (SYMBICORT) 160-4.5 MCG/ACT inhaler Inhale 2 puffs into the lungs 2 (two) times daily.    . cetirizine (ZYRTEC) 10 MG tablet Take 10 mg by mouth daily.     . clopidogrel (PLAVIX) 75 MG tablet Take 1 tablet (75 mg total) by mouth daily. 90 tablet 3  . clotrimazole (LOTRIMIN) 1 % cream Apply 1 application topically 2 (two) times daily as needed (rash).    . clotrimazole (MYCELEX) 10 MG troche Take 10 mg by mouth daily as needed ([place]).     . colchicine 0.6 MG tablet Take 0.6 mg by mouth 2 (two) times daily as needed (gout flare).     . cycloSPORINE (RESTASIS) 0.05 % ophthalmic emulsion Place 1 drop into both eyes 2 (two) times daily.    . diclofenac sodium (VOLTAREN) 1 % GEL Apply 2 g topically 4 (four) times daily. 1 Tube 0  . diphenhydrAMINE (BENADRYL) 25 MG tablet Take 25 mg by mouth every 6 (six) hours as needed for allergies.    [name] EPINEPHrine (EPIPEN 2-PAK) 0.3 mg/0.3 mL IJ SOAJ injection Inject 0.3 mg into the muscle once as needed (severe allergic reaction).    . febuxostat (ULORIC) 40 MG tablet Take 40 mg by mouth daily.  3  . GLUCOSAMINE-CHONDROITIN PO Take 1 tablet by mouth daily.    [name] guaiFENesin (MUCINEX) 600 MG 12 hr tablet Take 600 mg by mouth at bedtime as needed for cough or to loosen phlegm.     . hydrocortisone 2.5 % cream Apply 1 application topically daily as needed (itching).   1  .  hydroxypropyl methylcellulose / hypromellose (ISOPTO TEARS / GONIOVISC) 2.5 % ophthalmic solution Place 1 drop into both eyes 3 (three) times daily.     [name] ipratropium (ATROVENT) 0.02 % nebulizer solution Take 2.5 mLs (0.5 mg total) by nebulization 4 (four) times daily. 75 mL 0  . isosorbide mononitrate (IMDUR) 30 MG 24 hr tablet Take 1 tablet (30 mg total) by mouth daily.    [name] levofloxacin (LEVAQUIN) 500 MG tablet Take 500 mg by mouth  daily.    . metolazone (ZAROXOLYN) 2.5 MG tablet Take 1 tablet (2.5 mg total) by mouth daily as needed (for weight gain greater than 3 pounds). Take one tablet on Monday and one tablet on Thursday.    . Multiple Vitamin (MULTIVITAMIN WITH MINERALS) TABS tablet Take 1 tablet by mouth daily.    . nitroGLYCERIN (NITROSTAT) 0.4 MG SL tablet Place 1 tablet (0.4 mg total) under the tongue every 5 (five) minutes as needed for chest pain. 25 tablet 3  . ondansetron (ZOFRAN ODT) 4 MG disintegrating tablet Take 1 tablet (4 mg total) by mouth every 8 (eight) hours as needed for nausea or vomiting. 20 tablet 0  . OXYGEN Place 3 L into the nose continuous.     . polyethylene glycol (MIRALAX / GLYCOLAX) 17 g packet Take 17 g by mouth daily.    . potassium chloride SA (KLOR-CON) 20 MEQ tablet Take 1 tablet (20 mEq total) by mouth 2 (two) times daily. 90 tablet 3  . rosuvastatin (CRESTOR) 20 MG tablet TAKE 1 TABLET AT BEDTIME 90 tablet 1  . saxagliptin HCl (ONGLYZA) 2.5 MG TABS tablet Take 2.5 mg by mouth daily.     [name] torsemide (DEMADEX) 20 MG tablet Take 3 tablets (60 mg total) by mouth daily. 90 tablet 3  . triamcinolone cream (KENALOG) 0.1 % Apply 1 application topically 2 (two) times daily as needed (itching).      No current facility-administered medications for this encounter.    Allergies  Allergen Reactions  . Ivp Dye [Iodinated Diagnostic Agents] Shortness Of Breath    No reaction to PO contrast with non-ionic dye.06-25-2014/rsm  . Shellfish Allergy Anaphylaxis  .  Shellfish-Derived Products Anaphylaxis  . Sulfa Antibiotics Shortness Of Breath  . Iodine Hives  . Atorvastatin Other (See Comments)    Pt states "causes bilateral leg pain/cramps."  . Doxycycline Other (See Comments)    Unknown rxn per pt  . Cephalexin Itching and Rash  . Zithromax [Azithromycin] Rash      Social History   Socioeconomic History  . Marital status: Widowed    Spouse name: Not on file  . Number of children: 5  . Years of education: Not on file  . Highest education level: Not on file  Occupational History  . Occupation: STAFF/BUFFET    Employer: [employer]  Tobacco Use  . Smoking status: Former Smoker    Packs/day: 0.25    Years: 3.00    Pack years: 0.75    Types: Cigarettes    Quit date: 02/11/1968    Years since quitting: 51.0  . Smokeless tobacco: Never Used  Substance and Sexual Activity  . Alcohol use: No  . Drug use: No  . Sexual activity: Never  Other Topics Concern  . Not on file  Social History Narrative   Widowed last year. She lives with her two sons.   Social Determinants of Health   Financial Resource Strain:   . Difficulty of Paying Living Expenses: Not on file  Food Insecurity:   . Worried About Charity fundraiser in the Last Year: Not on file  . Ran Out of Food in the Last Year: Not on file  Transportation Needs:   . Lack of Transportation (Medical): Not on file  . Lack of Transportation (Non-Medical): Not on file  Physical Activity:   . Days of Exercise per Week: Not on file  . Minutes of Exercise per Session: Not on file  Stress:   .  Feeling of Stress : Not on file  Social Connections:   . Frequency of Communication with Friends and Family: Not on file  . Frequency of Social Gatherings with Friends and Family: Not on file  . Attends Religious Services: Not on file  . Active Member of Clubs or Organizations: Not on file  . Attends Archivist Meetings: Not on file  . Marital Status: Not on file    Intimate Partner Violence:   . Fear of Current or Ex-Partner: Not on file  . Emotionally Abused: Not on file  . Physically Abused: Not on file  . Sexually Abused: Not on file      Family History  Problem Relation Age of Onset  . Breast cancer Mother   . Diabetes Mother   . Heart disease Maternal Grandmother   . Kidney disease Maternal Grandmother   . Diabetes Maternal Grandmother   . Glaucoma Maternal Aunt   . Heart disease Maternal Aunt   . Asthma Sister   . Colon cancer Neg Hx   . Stomach cancer Neg Hx   . Pancreatic cancer Neg Hx     Vitals:   03/02/19 1515  BP: 138/84  Pulse: 78  SpO2: 96%  Weight: 116.8 kg (257 lb 9.6 oz)   Wt Readings from Last 3 Encounters:  03/02/19 116.8 kg (257 lb 9.6 oz)  02/14/19 112.9 kg (249 lb)  01/27/19 114.3 kg (252 lb)    Chest Circumfrence 144 cm.   PHYSICAL EXAM: General:  Markedly obese woman sitting in WC on O2. No resp difficulty HEENT: normal Neck: supple. Hard to see JVP - looks mildly elevated Carotids 2+ bilat; no bruits. No lymphadenopathy or thryomegaly appreciated. Cor: PMI nonpalpable Regular rate & rhythm. No rubs, gallops or murmurs. Lungs: clear Abdomen: markedly obese soft, nontender, nondistended. No hepatosplenomegaly. No bruits or masses. Good bowel sounds. Extremities: no cyanosis, clubbing, rash, 1+ edema on left Neuro: alert & orientedx3, cranial nerves grossly intact. moves all 4 extremities w/o difficulty. Affect pleasant   ASSESSMENT & PLAN:  1. Chronic systolic/diastolic HF  - Echo 0/53 EF 55-60%, mild LVH.  - RHC in 8/18 looked good. - Repeat echo 12/20 EF down to 40-45% in setting of loss of LV pacing which has now been restored - Volume status hard to assess on exam given size. Do not feel she is markedly overloaded.  - NYHA IIIb - suspect multifactorial with her weight being major contributor - Will plan R/L cath to further evaluate cardiac cause next week.  - Stressed need to cut intake and  lose weight    2. CAD s/p CABG 11/2014 -  s/p LCX stents in 2018 - now with daily CP - R/L cath as above - Continue statin, bb, aspirin.   3. Morbid obesity - stressed absolute need for weight loss with [place] and more activity   4. H/o heart block s/p BiV pacer/CRT-D [employer] - Followed by Dr. Recardo Evangelist. Recent LV lead reprogrammed  5. Chronic renal failure stage III - BMET today  6. OSA:  - She cannot tolerate CPAP. Will refer to Dr Radford Pax to discuss options (?Inspire device)  7. Chronic respiratory failure - likely OSA/OHS - continue O2 - needs weight loss   Total time spent 45 minutes. Over half that time spent discussing above.    Glori Bickers, MD   3:26 PM

## 2019-03-03 ENCOUNTER — Telehealth (HOSPITAL_COMMUNITY): Payer: Self-pay | Admitting: *Deleted

## 2019-03-03 DIAGNOSIS — I5032 Chronic diastolic (congestive) heart failure: Secondary | ICD-10-CM

## 2019-03-03 DIAGNOSIS — R2689 Other abnormalities of gait and mobility: Secondary | ICD-10-CM | POA: Diagnosis not present

## 2019-03-03 NOTE — Telephone Encounter (Signed)
1. Hold torsemide for 2 days. Restart at 40 daily 2. Give kcl 80 meq x 1 3. Repeat BMET on Monday  Lab results per Dr Haroldine Laws:  Pt is aware, agreeable and verbalizes understanding.  Lab appt sch for [facility]

## 2019-03-04 ENCOUNTER — Telehealth (HOSPITAL_COMMUNITY): Payer: Self-pay

## 2019-03-04 NOTE — Telephone Encounter (Signed)
Pt to have labs drawn at pcp. Fax script. Confirmation received.

## 2019-03-07 ENCOUNTER — Other Ambulatory Visit (HOSPITAL_COMMUNITY): Payer: Medicare HMO

## 2019-03-08 ENCOUNTER — Other Ambulatory Visit: Payer: Self-pay

## 2019-03-08 DIAGNOSIS — R062 Wheezing: Secondary | ICD-10-CM | POA: Diagnosis not present

## 2019-03-08 DIAGNOSIS — R269 Unspecified abnormalities of gait and mobility: Secondary | ICD-10-CM | POA: Diagnosis not present

## 2019-03-08 DIAGNOSIS — J9611 Chronic respiratory failure with hypoxia: Secondary | ICD-10-CM | POA: Diagnosis not present

## 2019-03-08 DIAGNOSIS — I5022 Chronic systolic (congestive) heart failure: Secondary | ICD-10-CM | POA: Diagnosis not present

## 2019-03-08 DIAGNOSIS — I5032 Chronic diastolic (congestive) heart failure: Secondary | ICD-10-CM | POA: Diagnosis not present

## 2019-03-08 NOTE — Patient Outreach (Signed)
[place] [facility]) Care Management  03/08/2019  [name] 1945/10/03 [id number]   Referral Date: 03/02/19 Referral Source: [facility] Referral Reason: Social work referral for transportation and help around the home.   Outreach Attempt: Spoke with patient. She states she is doing good. Discussed referral reason. Patient states she utilizes transportation through her [employer] benefit but wants more options. Patient states that she needs help around the home such as laundry and cleaning and possible assist in shower.   Social: Patient lives in the home with her son but son is not available to help as he works full time.  He does help with grocery shopping. Patient states she can bathe and dress her self but has a hard time getting in the shower.     Conditions: Patient admits to heart failure, DM, HTN, gout, A. Fib. Patient seen regularly by remote health and is also active with the [facility] program where she receives monthly calls from her nurse.  She does not remember her nurses name at the moment.     Medications: Patient states she takes her medications as prescribed and uses her pill box. Patient states she has no problems affording her medications.   Appointments:  Patient sees physicians regularly and utilizes [employer] for transportation.    Advanced Directives:  Patient has a most form at this time.   Consent: Discussed [facility] services and support. Patient agreeable to social work at this time.     Plan: RN CM will refer patient to social work for assistance.     Jone Baseman, RN, MSN [facility] Care Management Care Management Coordinator Direct Line [phone] Toll Free: [phone]  Fax: [phone]

## 2019-03-11 ENCOUNTER — Ambulatory Visit (HOSPITAL_BASED_OUTPATIENT_CLINIC_OR_DEPARTMENT_OTHER): Payer: Medicare HMO

## 2019-03-11 ENCOUNTER — Ambulatory Visit (HOSPITAL_COMMUNITY)
Admission: RE | Disposition: A | Discharge status: Home / Self Care | Payer: Self-pay | Source: Ambulatory Visit | Attending: Internal Medicine

## 2019-03-11 ENCOUNTER — Other Ambulatory Visit: Payer: Self-pay

## 2019-03-11 ENCOUNTER — Other Ambulatory Visit (HOSPITAL_COMMUNITY)
Admission: RE | Admit: 2019-03-11 | Discharge: 2019-03-11 | Disposition: A | Discharge status: Home / Self Care | Payer: Medicare HMO | Source: Ambulatory Visit | Attending: Internal Medicine | Admitting: Internal Medicine

## 2019-03-11 ENCOUNTER — Ambulatory Visit (HOSPITAL_COMMUNITY): Payer: Medicare HMO

## 2019-03-11 ENCOUNTER — Ambulatory Visit (HOSPITAL_COMMUNITY)
Admission: RE | Admit: 2019-03-11 | Discharge: 2019-03-11 | Disposition: A | Discharge status: Home / Self Care | Payer: Medicare HMO | Source: Ambulatory Visit | Attending: Internal Medicine | Admitting: Internal Medicine

## 2019-03-11 DIAGNOSIS — I459 Conduction disorder, unspecified: Secondary | ICD-10-CM | POA: Diagnosis not present

## 2019-03-11 DIAGNOSIS — N183 Chronic kidney disease, stage 3 unspecified: Secondary | ICD-10-CM | POA: Insufficient documentation

## 2019-03-11 DIAGNOSIS — Z7984 Long term (current) use of oral hypoglycemic drugs: Secondary | ICD-10-CM | POA: Insufficient documentation

## 2019-03-11 DIAGNOSIS — Z95810 Presence of automatic (implantable) cardiac defibrillator: Secondary | ICD-10-CM | POA: Insufficient documentation

## 2019-03-11 DIAGNOSIS — L7632 Postprocedural hematoma of skin and subcutaneous tissue following other procedure: Secondary | ICD-10-CM | POA: Insufficient documentation

## 2019-03-11 DIAGNOSIS — J9610 Chronic respiratory failure, unspecified whether with hypoxia or hypercapnia: Secondary | ICD-10-CM | POA: Insufficient documentation

## 2019-03-11 DIAGNOSIS — M109 Gout, unspecified: Secondary | ICD-10-CM | POA: Diagnosis not present

## 2019-03-11 DIAGNOSIS — Z951 Presence of aortocoronary bypass graft: Secondary | ICD-10-CM | POA: Insufficient documentation

## 2019-03-11 DIAGNOSIS — K219 Gastro-esophageal reflux disease without esophagitis: Secondary | ICD-10-CM | POA: Diagnosis not present

## 2019-03-11 DIAGNOSIS — I25810 Atherosclerosis of coronary artery bypass graft(s) without angina pectoris: Secondary | ICD-10-CM

## 2019-03-11 DIAGNOSIS — Z20822 Contact with and (suspected) exposure to covid-19: Secondary | ICD-10-CM | POA: Diagnosis not present

## 2019-03-11 DIAGNOSIS — J449 Chronic obstructive pulmonary disease, unspecified: Secondary | ICD-10-CM | POA: Insufficient documentation

## 2019-03-11 DIAGNOSIS — I5032 Chronic diastolic (congestive) heart failure: Secondary | ICD-10-CM

## 2019-03-11 DIAGNOSIS — Z6841 Body Mass Index (BMI) 40.0 and over, adult: Secondary | ICD-10-CM | POA: Insufficient documentation

## 2019-03-11 DIAGNOSIS — I5042 Chronic combined systolic (congestive) and diastolic (congestive) heart failure: Secondary | ICD-10-CM | POA: Insufficient documentation

## 2019-03-11 DIAGNOSIS — Z888 Allergy status to other drugs, medicaments and biological substances status: Secondary | ICD-10-CM | POA: Insufficient documentation

## 2019-03-11 DIAGNOSIS — K760 Fatty (change of) liver, not elsewhere classified: Secondary | ICD-10-CM | POA: Insufficient documentation

## 2019-03-11 DIAGNOSIS — E7800 Pure hypercholesterolemia, unspecified: Secondary | ICD-10-CM | POA: Diagnosis not present

## 2019-03-11 DIAGNOSIS — E1122 Type 2 diabetes mellitus with diabetic chronic kidney disease: Secondary | ICD-10-CM | POA: Diagnosis not present

## 2019-03-11 DIAGNOSIS — I724 Aneurysm of artery of lower extremity: Secondary | ICD-10-CM | POA: Diagnosis not present

## 2019-03-11 DIAGNOSIS — I255 Ischemic cardiomyopathy: Secondary | ICD-10-CM | POA: Diagnosis not present

## 2019-03-11 DIAGNOSIS — K589 Irritable bowel syndrome without diarrhea: Secondary | ICD-10-CM | POA: Diagnosis not present

## 2019-03-11 DIAGNOSIS — Z791 Long term (current) use of non-steroidal anti-inflammatories (NSAID): Secondary | ICD-10-CM | POA: Diagnosis not present

## 2019-03-11 DIAGNOSIS — I2510 Atherosclerotic heart disease of native coronary artery without angina pectoris: Secondary | ICD-10-CM | POA: Insufficient documentation

## 2019-03-11 DIAGNOSIS — Z7982 Long term (current) use of aspirin: Secondary | ICD-10-CM | POA: Insufficient documentation

## 2019-03-11 DIAGNOSIS — Z79899 Other long term (current) drug therapy: Secondary | ICD-10-CM | POA: Insufficient documentation

## 2019-03-11 DIAGNOSIS — I130 Hypertensive heart and chronic kidney disease with heart failure and stage 1 through stage 4 chronic kidney disease, or unspecified chronic kidney disease: Secondary | ICD-10-CM | POA: Insufficient documentation

## 2019-03-11 DIAGNOSIS — Z7951 Long term (current) use of inhaled steroids: Secondary | ICD-10-CM | POA: Diagnosis not present

## 2019-03-11 DIAGNOSIS — Z881 Allergy status to other antibiotic agents status: Secondary | ICD-10-CM | POA: Insufficient documentation

## 2019-03-11 DIAGNOSIS — G4733 Obstructive sleep apnea (adult) (pediatric): Secondary | ICD-10-CM | POA: Insufficient documentation

## 2019-03-11 DIAGNOSIS — Z87891 Personal history of nicotine dependence: Secondary | ICD-10-CM | POA: Insufficient documentation

## 2019-03-11 DIAGNOSIS — Z8249 Family history of ischemic heart disease and other diseases of the circulatory system: Secondary | ICD-10-CM | POA: Insufficient documentation

## 2019-03-11 DIAGNOSIS — Z8673 Personal history of transient ischemic attack (TIA), and cerebral infarction without residual deficits: Secondary | ICD-10-CM | POA: Insufficient documentation

## 2019-03-11 DIAGNOSIS — Z7902 Long term (current) use of antithrombotics/antiplatelets: Secondary | ICD-10-CM | POA: Insufficient documentation

## 2019-03-11 DIAGNOSIS — Z9981 Dependence on supplemental oxygen: Secondary | ICD-10-CM | POA: Insufficient documentation

## 2019-03-11 DIAGNOSIS — Z833 Family history of diabetes mellitus: Secondary | ICD-10-CM | POA: Insufficient documentation

## 2019-03-11 DIAGNOSIS — Z882 Allergy status to sulfonamides status: Secondary | ICD-10-CM | POA: Insufficient documentation

## 2019-03-11 HISTORY — PX: RIGHT/LEFT HEART CATH AND CORONARY/GRAFT ANGIOGRAPHY: CATH118267

## 2019-03-11 LAB — POCT I-STAT EG7
Acid-Base Excess: 10 mmol/L — ABNORMAL HIGH (ref 0.0–2.0)
Acid-Base Excess: 11 mmol/L — ABNORMAL HIGH (ref 0.0–2.0)
Acid-Base Excess: 11 mmol/L — ABNORMAL HIGH (ref 0.0–2.0)
Bicarbonate: 38.8 mmol/L — ABNORMAL HIGH (ref 20.0–28.0)
Bicarbonate: 39.2 mmol/L — ABNORMAL HIGH (ref 20.0–28.0)
Bicarbonate: 40 mmol/L — ABNORMAL HIGH (ref 20.0–28.0)
Calcium, Ion: 1.14 mmol/L — ABNORMAL LOW (ref 1.15–1.40)
Calcium, Ion: 1.18 mmol/L (ref 1.15–1.40)
Calcium, Ion: 1.2 mmol/L (ref 1.15–1.40)
HCT: 35 % — ABNORMAL LOW (ref 36.0–46.0)
HCT: 37 % (ref 36.0–46.0)
HCT: 37 % (ref 36.0–46.0)
Hemoglobin: 11.9 g/dL — ABNORMAL LOW (ref 12.0–15.0)
Hemoglobin: 12.6 g/dL (ref 12.0–15.0)
Hemoglobin: 12.6 g/dL (ref 12.0–15.0)
O2 Saturation: 65 %
O2 Saturation: 69 %
O2 Saturation: 75 %
Potassium: 3.4 mmol/L — ABNORMAL LOW (ref 3.5–5.1)
Potassium: 3.5 mmol/L (ref 3.5–5.1)
Potassium: 3.5 mmol/L (ref 3.5–5.1)
Sodium: 140 mmol/L (ref 135–145)
Sodium: 140 mmol/L (ref 135–145)
Sodium: 141 mmol/L (ref 135–145)
TCO2: 41 mmol/L — ABNORMAL HIGH (ref 22–32)
TCO2: 41 mmol/L — ABNORMAL HIGH (ref 22–32)
TCO2: 42 mmol/L — ABNORMAL HIGH (ref 22–32)
pCO2, Ven: 70.6 mmHg (ref 44.0–60.0)
pCO2, Ven: 71 mmHg (ref 44.0–60.0)
pCO2, Ven: 72.4 mmHg (ref 44.0–60.0)
pH, Ven: 7.346 (ref 7.250–7.430)
pH, Ven: 7.35 (ref 7.250–7.430)
pH, Ven: 7.353 (ref 7.250–7.430)
pO2, Ven: 38 mmHg (ref 32.0–45.0)
pO2, Ven: 40 mmHg (ref 32.0–45.0)
pO2, Ven: 44 mmHg (ref 32.0–45.0)

## 2019-03-11 LAB — POCT I-STAT 7, (LYTES, BLD GAS, ICA,H+H)
Acid-Base Excess: 11 mmol/L — ABNORMAL HIGH (ref 0.0–2.0)
Bicarbonate: 38.5 mmol/L — ABNORMAL HIGH (ref 20.0–28.0)
Calcium, Ion: 1.13 mmol/L — ABNORMAL LOW (ref 1.15–1.40)
HCT: 32 % — ABNORMAL LOW (ref 36.0–46.0)
Hemoglobin: 10.9 g/dL — ABNORMAL LOW (ref 12.0–15.0)
O2 Saturation: 96 %
Potassium: 3.3 mmol/L — ABNORMAL LOW (ref 3.5–5.1)
Sodium: 141 mmol/L (ref 135–145)
TCO2: 40 mmol/L — ABNORMAL HIGH (ref 22–32)
pCO2 arterial: 66.6 mmHg (ref 32.0–48.0)
pH, Arterial: 7.37 (ref 7.350–7.450)
pO2, Arterial: 88 mmHg (ref 83.0–108.0)

## 2019-03-11 LAB — BASIC METABOLIC PANEL
Anion gap: 12 (ref 5–15)
BUN: 38 mg/dL — ABNORMAL HIGH (ref 8–23)
CO2: 37 mmol/L — ABNORMAL HIGH (ref 22–32)
Calcium: 9.4 mg/dL (ref 8.9–10.3)
Chloride: 93 mmol/L — ABNORMAL LOW (ref 98–111)
Creatinine, Ser: 1.62 mg/dL — ABNORMAL HIGH (ref 0.44–1.00)
GFR calc Af Amer: 36 mL/min — ABNORMAL LOW (ref >60)
GFR calc non Af Amer: 31 mL/min — ABNORMAL LOW (ref >60)
Glucose, Bld: 176 mg/dL — ABNORMAL HIGH (ref 70–99)
Potassium: 3.5 mmol/L (ref 3.5–5.1)
Sodium: 142 mmol/L (ref 135–145)

## 2019-03-11 LAB — RESPIRATORY PANEL BY RT PCR (FLU A&B, COVID)
Influenza A by PCR: NEGATIVE
Influenza B by PCR: NEGATIVE
SARS Coronavirus 2 by RT PCR: NEGATIVE

## 2019-03-11 LAB — GLUCOSE, CAPILLARY
Glucose-Capillary: 160 mg/dL — ABNORMAL HIGH (ref 70–99)
Glucose-Capillary: 203 mg/dL — ABNORMAL HIGH (ref 70–99)

## 2019-03-11 LAB — CBC
HCT: 38.3 % (ref 36.0–46.0)
Hemoglobin: 11.6 g/dL — ABNORMAL LOW (ref 12.0–15.0)
MCH: 28.3 pg (ref 26.0–34.0)
MCHC: 30.3 g/dL (ref 30.0–36.0)
MCV: 93.4 fL (ref 80.0–100.0)
Platelets: 156 10*3/uL (ref 150–400)
RBC: 4.1 MIL/uL (ref 3.87–5.11)
RDW: 14.1 % (ref 11.5–15.5)
WBC: 7.3 10*3/uL (ref 4.0–10.5)
nRBC: 0 % (ref 0.0–0.2)

## 2019-03-11 SURGERY — RIGHT/LEFT HEART CATH AND CORONARY/GRAFT ANGIOGRAPHY
Anesthesia: LOCAL

## 2019-03-11 MED ORDER — SODIUM CHLORIDE 0.9% FLUSH: 3.0000 mL | Freq: Two times a day (BID) | INTRAVENOUS | Status: DC

## 2019-03-11 MED ORDER — CLOPIDOGREL BISULFATE 75 MG PO TABS
75.0000 mg | ORAL_TABLET | ORAL | Status: AC
  Administered 2019-03-11: 75 mg via ORAL

## 2019-03-11 MED ORDER — IOHEXOL 350 MG/ML SOLN
INTRAVENOUS | Status: DC | PRN
  Administered 2019-03-11: 130 mL

## 2019-03-11 MED ORDER — DIPHENHYDRAMINE HCL 50 MG/ML IJ SOLN
25.0000 mg | Freq: Once | INTRAMUSCULAR | Status: AC
  Administered 2019-03-11: 25 mg via INTRAVENOUS

## 2019-03-11 MED ORDER — METHYLPREDNISOLONE SODIUM SUCC 125 MG IJ SOLR
INTRAMUSCULAR | Status: AC
  Filled 2019-03-11: qty 2

## 2019-03-11 MED ORDER — MIDAZOLAM HCL 2 MG/2ML IJ SOLN
INTRAMUSCULAR | Status: AC
  Filled 2019-03-11: qty 2

## 2019-03-11 MED ORDER — ASPIRIN 81 MG PO CHEW: 81.0000 mg | CHEWABLE_TABLET | ORAL | Status: DC

## 2019-03-11 MED ORDER — HEPARIN (PORCINE) IN NACL 1000-0.9 UT/500ML-% IV SOLN
INTRAVENOUS | Status: AC
  Filled 2019-03-11: qty 1000

## 2019-03-11 MED ORDER — ACETAMINOPHEN 325 MG PO TABS: 650.0000 mg | ORAL_TABLET | ORAL | Status: DC | PRN

## 2019-03-11 MED ORDER — FENTANYL CITRATE (PF) 100 MCG/2ML IJ SOLN
INTRAMUSCULAR | Status: DC | PRN
  Administered 2019-03-11 (×3): 25 ug via INTRAVENOUS

## 2019-03-11 MED ORDER — SODIUM CHLORIDE 0.9% FLUSH: 3.0000 mL | INTRAVENOUS | Status: DC | PRN

## 2019-03-11 MED ORDER — SODIUM CHLORIDE 0.9 % IV SOLN: 250.0000 mL | INTRAVENOUS | Status: DC | PRN

## 2019-03-11 MED ORDER — MIDAZOLAM HCL 2 MG/2ML IJ SOLN
INTRAMUSCULAR | Status: DC | PRN
  Administered 2019-03-11 (×3): 1 mg via INTRAVENOUS

## 2019-03-11 MED ORDER — LIDOCAINE HCL (PF) 1 % IJ SOLN
INTRAMUSCULAR | Status: AC
  Filled 2019-03-11: qty 30

## 2019-03-11 MED ORDER — SODIUM CHLORIDE 0.9 % IV SOLN: INTRAVENOUS | Status: AC

## 2019-03-11 MED ORDER — HYDRALAZINE HCL 20 MG/ML IJ SOLN: 10.0000 mg | INTRAMUSCULAR | Status: DC | PRN

## 2019-03-11 MED ORDER — HEPARIN (PORCINE) IN NACL 1000-0.9 UT/500ML-% IV SOLN
INTRAVENOUS | Status: DC | PRN
  Administered 2019-03-11 (×2): 500 mL

## 2019-03-11 MED ORDER — FENTANYL CITRATE (PF) 100 MCG/2ML IJ SOLN
INTRAMUSCULAR | Status: AC
  Filled 2019-03-11: qty 2

## 2019-03-11 MED ORDER — SODIUM CHLORIDE 0.9 % IV SOLN: INTRAVENOUS | Status: DC

## 2019-03-11 MED ORDER — LIDOCAINE HCL (PF) 1 % IJ SOLN
INTRAMUSCULAR | Status: DC | PRN
  Administered 2019-03-11: 3 mL
  Administered 2019-03-11: 17 mL

## 2019-03-11 MED ORDER — VERAPAMIL HCL 2.5 MG/ML IV SOLN
INTRAVENOUS | Status: AC
  Filled 2019-03-11: qty 2

## 2019-03-11 MED ORDER — DIPHENHYDRAMINE HCL 50 MG/ML IJ SOLN
INTRAMUSCULAR | Status: AC
  Filled 2019-03-11: qty 1

## 2019-03-11 MED ORDER — METHYLPREDNISOLONE SODIUM SUCC 125 MG IJ SOLR
125.0000 mg | Freq: Once | INTRAMUSCULAR | Status: AC
  Administered 2019-03-11: 125 mg via INTRAVENOUS

## 2019-03-11 MED ORDER — CLOPIDOGREL BISULFATE 75 MG PO TABS
ORAL_TABLET | ORAL | Status: AC
  Filled 2019-03-11: qty 1

## 2019-03-11 MED ORDER — LABETALOL HCL 5 MG/ML IV SOLN: 10.0000 mg | INTRAVENOUS | Status: DC | PRN

## 2019-03-11 MED ORDER — HYDRALAZINE HCL 20 MG/ML IJ SOLN: 10.0000 mg | Freq: Once | INTRAMUSCULAR | Status: DC

## 2019-03-11 MED ORDER — ONDANSETRON HCL 4 MG/2ML IJ SOLN: 4.0000 mg | Freq: Four times a day (QID) | INTRAMUSCULAR | Status: DC | PRN

## 2019-03-11 SURGICAL SUPPLY — 20 items
CATH BALLN WEDGE 5F 110CM (CATHETERS) ×2 IMPLANT
CATH INFINITI 5 FR 3DRC (CATHETERS) ×2 IMPLANT
CATH INFINITI 5 FR AR1 MOD (CATHETERS) ×2 IMPLANT
CATH INFINITI 5 FR IM (CATHETERS) ×2 IMPLANT
CATH INFINITI 5 FR MPA2 (CATHETERS) ×2 IMPLANT
CATH INFINITI 5 FR RCB (CATHETERS) ×2 IMPLANT
CATH INFINITI 5FR AL1 (CATHETERS) ×2 IMPLANT
CATH INFINITI 5FR MULTPACK ANG (CATHETERS) ×2 IMPLANT
CATH SWAN GANZ 7F STRAIGHT (CATHETERS) ×2 IMPLANT
GLIDESHEATH SLEND SS 6F .021 (SHEATH) ×2 IMPLANT
GUIDEWIRE .025 260CM (WIRE) ×2 IMPLANT
KIT HEART LEFT (KITS) ×2 IMPLANT
PACK CARDIAC CATHETERIZATION (CUSTOM PROCEDURE TRAY) ×2 IMPLANT
SHEATH GLIDE SLENDER 4/5FR (SHEATH) ×2 IMPLANT
SHEATH PINNACLE 5F 10CM (SHEATH) ×2 IMPLANT
SHEATH PINNACLE 7F 10CM (SHEATH) ×2 IMPLANT
TRANSDUCER W/STOPCOCK (MISCELLANEOUS) ×2 IMPLANT
WIRE EMERALD 3MM-J .035X150CM (WIRE) ×2 IMPLANT
WIRE EMERALD 3MM-J .035X260CM (WIRE) ×2 IMPLANT
WIRE EMERALD ST .035X260CM (WIRE) IMPLANT

## 2019-03-11 NOTE — Interval H&P Note (Signed)
History and Physical Interval Note:  03/11/2019 12:55 PM  [name]  has presented today for surgery, with the diagnosis of heart failure.  The various methods of treatment have been discussed with the patient and family. After consideration of risks, benefits and other options for treatment, the patient has consented to  Procedure(s): RIGHT/LEFT HEART CATH AND CORONARY ANGIOGRAPHY (N/A) with graft angiography and possible coronary/graft angioplasty as a surgical intervention.  The patient's history has been reviewed, patient examined, no change in status, stable for surgery.  I have reviewed the patient's chart and labs.  Questions were answered to the patient's satisfaction.     Laksh Hinners

## 2019-03-11 NOTE — Progress Notes (Signed)
Dr Marlou Porch in to see client and ok to d/c home

## 2019-03-11 NOTE — Progress Notes (Addendum)
Hematoma noted to right groin Pa in to assess. Pressure held to area

## 2019-03-11 NOTE — Patient Outreach (Signed)
[facility] Care Management  03/11/2019  [name] 01/31/1946 [id number]   Patient referred to [place] [employer] by [employer] regarding need for transportation an in-home assistance.  Unsuccessful Outreach to patient today.  No answer or option to leave message.  Mailed Unsuccessful Outreach Letter.  Will attempt to reach again within four business days.  Ronn Melena, BSW Social Worker [phone]

## 2019-03-11 NOTE — Progress Notes (Signed)
I was paged by the nursing staff to evaluate her right groin hematoma. Reported that it had been fluctuating between feeling firm and soft. I went to evaluate and it had moderate firmness with mild tenderness but was much improved from earlier. [place] was ordered. While in the room [place] was being performed and Dr. Marlou Porch was present as well. IT showed a small hematoma with no outflow tract. Patient is stable for discharge.   Sumedh Shinsato Kathlen Mody, PA-C

## 2019-03-11 NOTE — Progress Notes (Signed)
Up and walked and tolerated well;  Right groin bruised and soft

## 2019-03-11 NOTE — Progress Notes (Signed)
Client states O2 tank empty and states does sometimes go without oxygen; oxygen on until client in car

## 2019-03-11 NOTE — Progress Notes (Addendum)
Hematoma noted to right groin area. Pressure held area now with less swelling noted

## 2019-03-11 NOTE — Progress Notes (Signed)
Vascular called to do ultrasound

## 2019-03-11 NOTE — Discharge Instructions (Signed)
Femoral Site Care This sheet gives you information about how to care for yourself after your procedure. Your health care provider may also give you more specific instructions. If you have problems or questions, contact your health care provider. What can I expect after the procedure? After the procedure, it is common to have:  Bruising that usually fades within 1-2 weeks.  Tenderness at the site. Follow these instructions at home: Wound care  Follow instructions from your health care provider about how to take care of your insertion site. Make sure you: ? Wash your hands with soap and water before you change your bandage (dressing). If soap and water are not available, use hand sanitizer. ? Change your dressing as told by your health care provider. ? Leave stitches (sutures), skin glue, or adhesive strips in place. These skin closures may need to stay in place for 2 weeks or longer. If adhesive strip edges start to loosen and curl up, you may trim the loose edges. Do not remove adhesive strips completely unless your health care provider tells you to do that.  Do not take baths, swim, or use a hot tub until your health care provider approves.  You may shower 24-48 hours after the procedure or as told by your health care provider. ? Gently wash the site with plain soap and water. ? Pat the area dry with a clean towel. ? Do not rub the site. This may cause bleeding.  Do not apply powder or lotion to the site. Keep the site clean and dry.  Check your femoral site every day for signs of infection. Check for: ? Redness, swelling, or pain. ? Fluid or blood. ? Warmth. ? Pus or a bad smell. Activity  For the first 2-3 days after your procedure, or as long as directed: ? Avoid climbing stairs as much as possible. ? Do not squat.  Do not lift anything that is heavier than 10 lb (4.5 kg), or the limit that you are told, until your health care provider says that it is safe.  Rest as  directed. ? Avoid sitting for a long time without moving. Get up to take short walks every 1-2 hours.  Do not drive for 24 hours if you were given a medicine to help you relax (sedative). General instructions  Take over-the-counter and prescription medicines only as told by your health care provider.  Keep all follow-up visits as told by your health care provider. This is important. Contact a health care provider if you have:  A fever or chills.  You have redness, swelling, or pain around your insertion site. Get help right away if:  The catheter insertion area swells very fast.  You pass out.  You suddenly start to sweat or your skin gets clammy.  The catheter insertion area is bleeding, and the bleeding does not stop when you hold steady pressure on the area.  The area near or just beyond the catheter insertion site becomes pale, cool, tingly, or numb. These symptoms may represent a serious problem that is an emergency. Do not wait to see if the symptoms will go away. Get medical help right away. Call your local emergency services (911 in the [place] Do not drive yourself to the hospital. Summary  After the procedure, it is common to have bruising that usually fades within 1-2 weeks.  Check your femoral site every day for signs of infection.  Do not lift anything that is heavier than 10 lb (4.5 kg), or the   limit that you are told, until your health care provider says that it is safe. This information is not intended to replace advice given to you by your health care provider. Make sure you discuss any questions you have with your health care provider. Document Revised: 02/09/2017 Document Reviewed: 02/09/2017 Elsevier Patient Education  2020 Elsevier Inc.  

## 2019-03-11 NOTE — Progress Notes (Signed)
Limited right lower extremity arterial duplex completed with Dr. Marlou Porch at the bedside.  03/11/2019 7:15 PM Maudry Mayhew, MHA, RVT, RDCS, RDMS

## 2019-03-11 NOTE — Progress Notes (Signed)
Md and Pa in to assess pt vascular tech in to do ultrasound. Md states ok for pt to go home.dressing applied to right groin ( was removed during ultrasound)

## 2019-03-11 NOTE — Progress Notes (Signed)
Site area: rt groin fa and fv sheaths Site Prior to Removal:  Level 0 Pressure Applied For: 20 minutes Manual:   yes Patient Status During Pull:  stable Post Pull Site:  Level 0 Post Pull Instructions Given:  yes Post Pull Pulses Present: rt dp palpable Dressing Applied:  Gauze and tegaderm Bedrest begins @ 5834 Comments:

## 2019-03-11 NOTE — Progress Notes (Signed)
Discharge instructions reviewed with pt voices understanding Call placed for PA to assess pt groin

## 2019-03-11 NOTE — Progress Notes (Signed)
PA cardiology/ Candace was called and updates to pt status of right groin hematoma, VSS, PA will come  to assess.

## 2019-03-12 ENCOUNTER — Telehealth: Payer: Self-pay | Admitting: Physician Assistant

## 2019-03-12 NOTE — Telephone Encounter (Signed)
Patient complaining of burning sensation of the eye says cardiac apposition yesterday.  She did not see any swelling near the eye socket.  She denies any decreased vision as well.  Other than burning sensation, she did not have any other symptoms.  She says initially she may have elevated redness around the area, however this has resolved.  I asked her to take a dose of prednisone and also Benadryl in case this is a allergic reaction.  She has documented allergy toward contrast dye.  She is aware that she need to go to the ED for emergency evaluation if she has any loss of vision or decreased vision.

## 2019-03-14 ENCOUNTER — Other Ambulatory Visit (HOSPITAL_COMMUNITY): Payer: Self-pay | Admitting: Internal Medicine

## 2019-03-14 ENCOUNTER — Emergency Department (HOSPITAL_COMMUNITY)
Admission: EM | Admit: 2019-03-14 | Discharge: 2019-03-15 | Disposition: A | Discharge status: Home / Self Care | Payer: Medicare HMO | Attending: Emergency Medicine | Admitting: Emergency Medicine

## 2019-03-14 ENCOUNTER — Other Ambulatory Visit: Payer: Self-pay

## 2019-03-14 ENCOUNTER — Telehealth (HOSPITAL_COMMUNITY): Payer: Self-pay | Admitting: *Deleted

## 2019-03-14 ENCOUNTER — Emergency Department (HOSPITAL_COMMUNITY): Payer: Medicare HMO

## 2019-03-14 DIAGNOSIS — Z95810 Presence of automatic (implantable) cardiac defibrillator: Secondary | ICD-10-CM | POA: Diagnosis not present

## 2019-03-14 DIAGNOSIS — N183 Chronic kidney disease, stage 3 unspecified: Secondary | ICD-10-CM | POA: Insufficient documentation

## 2019-03-14 DIAGNOSIS — R079 Chest pain, unspecified: Secondary | ICD-10-CM

## 2019-03-14 DIAGNOSIS — Z79899 Other long term (current) drug therapy: Secondary | ICD-10-CM | POA: Diagnosis not present

## 2019-03-14 DIAGNOSIS — Z87891 Personal history of nicotine dependence: Secondary | ICD-10-CM | POA: Diagnosis not present

## 2019-03-14 DIAGNOSIS — R21 Rash and other nonspecific skin eruption: Secondary | ICD-10-CM | POA: Diagnosis not present

## 2019-03-14 DIAGNOSIS — R9431 Abnormal electrocardiogram [ECG] [EKG]: Secondary | ICD-10-CM | POA: Diagnosis not present

## 2019-03-14 DIAGNOSIS — Z951 Presence of aortocoronary bypass graft: Secondary | ICD-10-CM | POA: Diagnosis not present

## 2019-03-14 DIAGNOSIS — I5032 Chronic diastolic (congestive) heart failure: Secondary | ICD-10-CM | POA: Diagnosis not present

## 2019-03-14 DIAGNOSIS — I2510 Atherosclerotic heart disease of native coronary artery without angina pectoris: Secondary | ICD-10-CM | POA: Insufficient documentation

## 2019-03-14 DIAGNOSIS — I130 Hypertensive heart and chronic kidney disease with heart failure and stage 1 through stage 4 chronic kidney disease, or unspecified chronic kidney disease: Secondary | ICD-10-CM | POA: Diagnosis not present

## 2019-03-14 DIAGNOSIS — Z20822 Contact with and (suspected) exposure to covid-19: Secondary | ICD-10-CM | POA: Diagnosis not present

## 2019-03-14 DIAGNOSIS — Z9861 Coronary angioplasty status: Secondary | ICD-10-CM | POA: Insufficient documentation

## 2019-03-14 DIAGNOSIS — E782 Mixed hyperlipidemia: Secondary | ICD-10-CM | POA: Insufficient documentation

## 2019-03-14 DIAGNOSIS — R0789 Other chest pain: Secondary | ICD-10-CM | POA: Insufficient documentation

## 2019-03-14 DIAGNOSIS — E119 Type 2 diabetes mellitus without complications: Secondary | ICD-10-CM | POA: Diagnosis not present

## 2019-03-14 LAB — BASIC METABOLIC PANEL
Anion gap: 10 (ref 5–15)
BUN: 44 mg/dL — ABNORMAL HIGH (ref 8–23)
CO2: 37 mmol/L — ABNORMAL HIGH (ref 22–32)
Calcium: 8.6 mg/dL — ABNORMAL LOW (ref 8.9–10.3)
Chloride: 92 mmol/L — ABNORMAL LOW (ref 98–111)
Creatinine, Ser: 1.77 mg/dL — ABNORMAL HIGH (ref 0.44–1.00)
GFR calc Af Amer: 32 mL/min — ABNORMAL LOW (ref >60)
GFR calc non Af Amer: 28 mL/min — ABNORMAL LOW (ref >60)
Glucose, Bld: 165 mg/dL — ABNORMAL HIGH (ref 70–99)
Potassium: 3 mmol/L — ABNORMAL LOW (ref 3.5–5.1)
Sodium: 139 mmol/L (ref 135–145)

## 2019-03-14 LAB — BRAIN NATRIURETIC PEPTIDE: B Natriuretic Peptide: 227.3 pg/mL — ABNORMAL HIGH (ref 0.0–100.0)

## 2019-03-14 LAB — CBC
HCT: 34.1 % — ABNORMAL LOW (ref 36.0–46.0)
Hemoglobin: 10.3 g/dL — ABNORMAL LOW (ref 12.0–15.0)
MCH: 28.2 pg (ref 26.0–34.0)
MCHC: 30.2 g/dL (ref 30.0–36.0)
MCV: 93.4 fL (ref 80.0–100.0)
Platelets: 154 10*3/uL (ref 150–400)
RBC: 3.65 MIL/uL — ABNORMAL LOW (ref 3.87–5.11)
RDW: 13.9 % (ref 11.5–15.5)
WBC: 8.9 10*3/uL (ref 4.0–10.5)
nRBC: 0 % (ref 0.0–0.2)

## 2019-03-14 LAB — TROPONIN I (HIGH SENSITIVITY): Troponin I (High Sensitivity): 15 ng/L (ref <18)

## 2019-03-14 MED FILL — Verapamil HCl IV Soln 2.5 MG/ML: INTRAVENOUS | Qty: 2 | Status: AC

## 2019-03-14 NOTE — ED Notes (Signed)
Changed pt 02 tank.

## 2019-03-14 NOTE — ED Triage Notes (Signed)
Pt reports recent cath procedure on Friday, no interventions done during procedure. Pt is now having sharp left sided chest pain that radiates to the back. Denies SOB, pt on 3L [place] all the time. Called Dr bensihmons office and he recommended she come here. Pt a.o, resp e.u

## 2019-03-14 NOTE — Telephone Encounter (Signed)
Pt called c/o pain radiating from under her back shoulder blade through her chest under her breast. Pt describes it as sharp/stabbing pains that come and go. Its worse when she takes a deep breath in or moves around. Pain started yesterday pt had a heart cath on Friday. Per Amy Clegg,NP pt needs to go to the emergency room to be evaluated. Pt aware and agreeable with plan. She stated she would have her son drive her.

## 2019-03-14 NOTE — Patient Outreach (Addendum)
[facility]) Care Management  03/14/2019  [name] [birth year] [id number]   Successful outreach to patient regarding social work referral from [employer] for transportation and in-home assistance.  Patient currently utilizes [employer] transportation when her son is unable to help.  Patient stated that sometimes she does not have three business days notice for appointments so she is in need of another resource.  Discussed [employer] services.  Patient unsure if she is already certified for these services.  Will message eligibility and submit application if patient is not already certified.  Discussed request/assessment process for personal care services.  Informed patient that, as part of assessment, she will be asked if she requires assistance with bathing, dressing, mobility, toileting, and eating.  Patient stated that she only needs assistance with housekeeping; independent with all other ADL's. With this being the case, patient is not eligible for pcs.  Unfortunately, she is unable to afford the cost of hiring someone to clean.  Will message Francene Finders with [facility] for possible resources.  Will follow up with patient when response is received about SCAT certification and possible resources for housekeeping.      Addendum:  Received response from [employer] that patient has not applied for service.  Application completed and submitted.  Will follow up on status in 2-3 weeks.    Ronn Melena, BSW Social Worker [phone]

## 2019-03-15 DIAGNOSIS — R0789 Other chest pain: Secondary | ICD-10-CM | POA: Diagnosis not present

## 2019-03-15 LAB — RESPIRATORY PANEL BY RT PCR (FLU A&B, COVID)
Influenza A by PCR: NEGATIVE
Influenza B by PCR: NEGATIVE
SARS Coronavirus 2 by RT PCR: NEGATIVE

## 2019-03-15 LAB — TROPONIN I (HIGH SENSITIVITY): Troponin I (High Sensitivity): 16 ng/L (ref <18)

## 2019-03-15 LAB — MAGNESIUM: Magnesium: 1.8 mg/dL (ref 1.7–2.4)

## 2019-03-15 MED ORDER — POTASSIUM CHLORIDE CRYS ER 20 MEQ PO TBCR
40.0000 meq | EXTENDED_RELEASE_TABLET | Freq: Once | ORAL | Status: AC
  Administered 2019-03-15: 05:00:00 40 meq via ORAL
  Filled 2019-03-15: qty 2

## 2019-03-15 MED ORDER — RANOLAZINE ER 500 MG PO TB12: 500.0000 mg | ORAL_TABLET | Freq: Two times a day (BID) | ORAL | 0 refills | Status: DC

## 2019-03-15 MED ORDER — CLOTRIMAZOLE 1 % EX CREA
TOPICAL_CREAM | Freq: Once | CUTANEOUS | Status: AC
  Filled 2019-03-15: qty 15

## 2019-03-15 NOTE — ED Notes (Signed)
Changed pt O2 tank 

## 2019-03-15 NOTE — ED Provider Notes (Signed)
[facility] EMERGENCY DEPARTMENT Provider Note   CSN: [id number] Arrival date & time: 03/14/19  1713     History Chief Complaint  Patient presents with  . Chest Pain  . Rash    [name] is a 74 y.o. female.  Patient presents to the emergency department with a chief complaint of chest pain.  She states that the symptoms started earlier yesterday.  She had a heart catheterization done on Friday of last week.  She called her cardiologist office, and was instructed to come to the emergency department for further evaluation.  She denies any shortness of breath.  She wears 3 L nasal cannula at baseline.  She states that she is still having intermittent waxing and waning central chest pain.  She did take aspirin today and nitroglycerin without relief.  The history is provided by the patient. No language interpreter was used.       Past Medical History:  Diagnosis Date  . AICD (automatic cardioverter/defibrillator) present   . Anemia   . Arthritis   . Asthma   . CAD (coronary artery disease)    a. 10/09/16 LHC: SVG->LAD patent, SVG->Diag patent, SVG->RCA patent, SVG->LCx occluded. EF 60%, b. 10/31/16 LHC DES to AV groove Circ, DES to intermed branch  . Cellulitis and abscess of foot 12/2014   RT FOOT  . CHF (congestive heart failure) ([place])   . Chronic bronchitis ([place])   . Chronic lower back pain   . Diverticulosis   . Facial numbness 02/2016  . Fatty liver disease, nonalcoholic   . Gastritis   . GERD (gastroesophageal reflux disease)   . Gout   . H/O hiatal hernia   . High cholesterol   . Hyperplastic colon polyp   . Hypertension   . IBS (irritable bowel syndrome)   . Internal hemorrhoids   . Ischemic cardiomyopathy   . Obesity   . On home oxygen therapy    "2L at night" (10/31/2016)  . OSA (obstructive sleep apnea)    "can't tolerate a mask" (10/30/2016)  . Pneumonia    "couple times" (10/31/2016)  . Shortness of breath   . TIA (transient  ischemic attack)    "recently" (10/31/2016)  . Type II diabetes mellitus [facility])     Patient Active Problem List   Diagnosis Date Noted  . Constipation 10/21/2018  . Leukopenia 05/03/2018  . Thrombocytopenia ([place]) 05/03/2018  . Pneumonia due to human metapneumovirus 05/02/2018  . Chest pain 05/01/2018  . Allergic rhinitis 04/13/2018  . S/P laparoscopic cholecystectomy 04/09/2018  . Chronic respiratory failure with hypoxia and hypercapnia ([place]) 03/07/2018  . Polyp of cecum   . Polyp of transverse colon   . Positive colorectal cancer screening using Cologuard test   . Dysphagia   . Acute on chronic renal failure ([place]) 11/20/2017  . CAD (coronary artery disease) 11/20/2017  . DM2 (diabetes mellitus, type 2) ([place]) 11/20/2017  . Abdominal pain, epigastric 11/20/2017  . Vasomotor rhinitis 10/16/2016  . Right facial numbness 03/05/2016  . CKD (chronic kidney disease), stage III 03/05/2016  . Atrial fibrillation ([place]) 10/24/2015  . Chest pain with moderate risk of acute coronary syndrome 10/23/2015  . History of TIA (transient ischemic attack) 10/22/2015  . Complete heart block ([place]) 06/22/2015  . Allergic drug rash due to anti-infective agent 04/29/2015  . Fatigue 02/14/2015  . Chronic diastolic CHF (congestive heart failure) ([place]) 02/14/2015  . Cellulitis 12/21/2014  . S/P CABG x 5 11/29/2014  . CAD S/P percutaneous  coronary angioplasty   . Diarrhea 07/12/2014  . Generalized abdominal pain 07/12/2014  . Diastolic CHF, acute on chronic (HCC) 11/04/2013  . Mixed hypercholesterolemia and hypertriglyceridemia 04/22/2013  . Vertigo 04/22/2013  . Dyspnea on exertion 08/25/2012  . Allergic to IV contrast 07/23/2012  . Unstable angina ([place]) 07/22/2012  . Morbid (severe) obesity due to excess calories ([place]) 11/22/2011  . Nonischemic cardiomyopathy ([place]) 11/22/2011  . Gastroesophageal reflux disease 11/22/2011  . Back pain 11/22/2011  . OSA (obstructive sleep apnea) 11/22/2011  .  HEMORRHOIDS-EXTERNAL 02/21/2010  . NAUSEA 02/21/2010  . ABDOMINAL PAIN -GENERALIZED 02/21/2010  . PERSONAL HX COLONIC POLYPS 02/21/2010  . ANEMIA 04/16/2007  . Essential hypertension 04/16/2007  . DIVERTICULOSIS, COLON 04/16/2007  . ARTHRITIS 04/16/2007  . INTERNAL HEMORRHOIDS WITHOUT MENTION COMP 04/12/2007  . Asthma 04/12/2007  . Cardiac resynchronization therapy pacemaker (CRT-P) in place 04/12/2007  . Gastritis without bleeding 12/14/2006  . COLONIC POLYPS, HYPERPLASTIC 09/27/2002  . FATTY LIVER DISEASE 02/16/2002    Past Surgical History:  Procedure Laterality Date  . ABDOMINAL ULTRASOUND  12/01/2011   Peripelvic cysts- #1- 2.4x1.9x2.3cm, #2-1.2x0.9x1.2cm  . ANKLE FRACTURE SURGERY Right    "had rod put in"  . ANTERIOR CERVICAL DECOMP/DISCECTOMY FUSION    . APPENDECTOMY    . BACK SURGERY    . BIOPSY  12/29/2017   Procedure: BIOPSY;  Surgeon: [name], MD;  Location: [facility];  Service: Endoscopy;;  . BIV ICD INSERTION CRT-D  2001?  [name] BIV PACEMAKER GENERATOR CHANGE OUT N/A 11/02/2012   Procedure: BIV PACEMAKER GENERATOR CHANGE OUT;  Surgeon: Sanda Klein, MD;  Location: [place] CATH LAB;  Service: Cardiovascular;  Laterality: N/A;  . CARDIAC CATHETERIZATION  05/17/1999   No significant coronary obstructive disease w/ mild 20% luminal irregularity of the first diag branch of the LAD  . CARDIAC CATHETERIZATION  07/08/2002   No significant CAD, moderately depressed LV systolic function  . CARDIAC CATHETERIZATION Bilateral 04/26/2007   Normal findings, recommend medical therapy  . CARDIAC CATHETERIZATION  02/18/2008   Moderate CAD, would benefit from having a functional study, recommend continue medical therapy  . CARDIAC CATHETERIZATION  07/23/2012   Medical therapy  . CARDIAC CATHETERIZATION N/A 11/24/2014   Procedure: Left Heart Cath and Coronary Angiography;  Surgeon: Troy Sine, MD;  Location: [place] CV LAB;  Service: Cardiovascular;  Laterality: N/A;  .  CARDIAC CATHETERIZATION  11/27/2014   Procedure: Intravascular Pressure Wire/FFR Study;  Surgeon: Peter M Martinique, MD;  Location: [place] CV LAB;  Service: Cardiovascular;;  . CARDIAC CATHETERIZATION  10/09/2016  . CHOLECYSTECTOMY N/A 04/09/2018   Procedure: LAPAROSCOPIC CHOLECYSTECTOMY;  Surgeon: Greer Pickerel, MD;  Location: [facility] ORS;  Service: General;  Laterality: N/A;  . COLONOSCOPY WITH PROPOFOL N/A 12/29/2017   Procedure: COLONOSCOPY WITH PROPOFOL;  Surgeon: [name], MD;  [facility];  Service: Endoscopy;  Laterality: N/A;  . CORONARY ANGIOGRAM  2010  . CORONARY ARTERY BYPASS GRAFT N/A 11/29/2014   Procedure: CORONARY ARTERY BYPASS GRAFTING x 5 (LIMA-LAD, SVG-D, SVG-OM1-OM2, SVG-PD);  Surgeon: Melrose Nakayama, MD;  Location: [facility];  Service: Open Heart Surgery;  Laterality: N/A;  . CORONARY STENT INTERVENTION N/A 10/31/2016   Procedure: CORONARY STENT INTERVENTION;  Surgeon: Burnell Blanks, MD;  Location: [place] CV LAB;  Service: Cardiovascular;  Laterality: N/A;  . ESOPHAGOGASTRODUODENOSCOPY (EGD) WITH PROPOFOL N/A 12/29/2017   Procedure: ESOPHAGOGASTRODUODENOSCOPY (EGD) WITH PROPOFOL;  Surgeon: [name], MD;  [facility];  Service: Endoscopy;  Laterality: N/A;  .  FRACTURE SURGERY    . INSERT / REPLACE / [place]  . KNEE ARTHROSCOPY Bilateral   . LEFT HEART CATH AND CORS/GRAFTS ANGIOGRAPHY N/A 12/09/2017   Procedure: LEFT HEART CATH AND CORS/GRAFTS ANGIOGRAPHY;  Surgeon: Troy Sine, MD;  Location: [place] CV LAB;  Service: Cardiovascular;  Laterality: N/A;  . LEFT HEART CATHETERIZATION WITH CORONARY ANGIOGRAM N/A 07/23/2012   Procedure: LEFT HEART CATHETERIZATION WITH CORONARY ANGIOGRAM;  Surgeon: Leonie Man, MD;  Location: [facility] CATH LAB;  Service: Cardiovascular;  Laterality: N/A;  Carlton Adam MYOVIEW  11/14/2011   Mild-moderate defect seen in Mid Inferolateral and Mid Anterolateral regions-consistant  w/ infarct/scar. No significant ischemia demonstrated.  [name] POLYPECTOMY  12/29/2017   Procedure: POLYPECTOMY;  Surgeon: [name], MD;  [facility];  Service: Endoscopy;;  . RIGHT/LEFT HEART CATH AND CORONARY ANGIOGRAPHY N/A 10/09/2016   Procedure: RIGHT/LEFT HEART CATH AND CORONARY ANGIOGRAPHY;  Surgeon: Jolaine Artist, MD;  Location: [place] CV LAB;  Service: Cardiovascular;  Laterality: N/A;  . RIGHT/LEFT HEART CATH AND CORONARY/GRAFT ANGIOGRAPHY N/A 03/11/2019   Procedure: RIGHT/LEFT HEART CATH AND CORONARY/GRAFT ANGIOGRAPHY;  Surgeon: Jolaine Artist, MD;  Location: [place] CV LAB;  Service: Cardiovascular;  Laterality: N/A;  . TEE WITHOUT CARDIOVERSION N/A 11/29/2014   Procedure: TRANSESOPHAGEAL ECHOCARDIOGRAM (TEE);  Surgeon: Melrose Nakayama, MD;  Location: [facility];  Service: Open Heart Surgery;  Laterality: N/A;  . TRANSTHORACIC ECHOCARDIOGRAM  07/23/2012   EF 55-60%, normal-mild  . TUBAL LIGATION       OB History   No obstetric history on file.     Family History  Problem Relation Age of Onset  . Breast cancer Mother   . Diabetes Mother   . Heart disease Maternal Grandmother   . Kidney disease Maternal Grandmother   . Diabetes Maternal Grandmother   . Glaucoma Maternal Aunt   . Heart disease Maternal Aunt   . Asthma Sister   . Colon cancer Neg Hx   . Stomach cancer Neg Hx   . Pancreatic cancer Neg Hx     Social History   Tobacco Use  . Smoking status: Former Smoker    Packs/day: 0.25    Years: 3.00    Pack years: 0.75    Types: Cigarettes    Quit date: 02/11/1968    Years since quitting: 51.1  . Smokeless tobacco: Never Used  Substance Use Topics  . Alcohol use: No  . Drug use: No    Home Medications Prior to Admission medications   Medication Sig Start Date End Date Taking? Authorizing Provider  acetaminophen (TYLENOL) 500 MG tablet Take 1,000 mg by mouth every 4 (four) hours as needed for headache (pain).     [provider]  albuterol (PROVENTIL HFA;VENTOLIN HFA) 108 (90 Base) MCG/ACT inhaler Inhale 2 puffs into the lungs every 6 (six) hours as needed for wheezing or shortness of breath. 05/02/18   Norval Morton, MD  albuterol (PROVENTIL) (2.5 MG/3ML) 0.083% nebulizer solution Take 3 mLs (2.5 mg total) by nebulization every 6 (six) hours as needed for wheezing or shortness of breath. 05/02/18   Norval Morton, MD  aspirin EC 81 MG tablet Take 1 tablet (81 mg total) by mouth daily. 03/27/16   Erlene Quan, PA-C  bisoprolol (ZEBETA) 5 MG tablet TAKE 1 TABLET EVERY DAY Patient taking differently: Take 5 mg by mouth daily.  08/27/18   Erlene Quan, PA-C  budesonide-formoterol (SYMBICORT) 160-4.5 MCG/ACT inhaler  Inhale 2 puffs into the lungs 2 (two) times daily.    [provider]  cetirizine (ZYRTEC) 10 MG tablet Take 10 mg by mouth daily.  03/24/17   [provider]  clopidogrel (PLAVIX) 75 MG tablet Take 1 tablet (75 mg total) by mouth daily. Patient taking differently: Take 75 mg by mouth at bedtime.  04/27/18   Croitoru, [name], MD  clotrimazole (LOTRIMIN) 1 % cream Apply 1 application topically 2 (two) times daily as needed (rash). 05/02/18   Norval Morton, MD  clotrimazole (MYCELEX) 10 MG troche Take 10 mg by mouth daily as needed (Sore Mouth/thrush).     [provider]  colchicine 0.6 MG tablet Take 0.6 mg by mouth 2 (two) times daily as needed (gout flare).     [provider]  cycloSPORINE (RESTASIS) 0.05 % ophthalmic emulsion Place 1 drop into both eyes 2 (two) times daily.    [provider]  diclofenac sodium (VOLTAREN) 1 % GEL Apply 2 g topically 4 (four) times daily. Patient taking differently: Apply 2 g topically 4 (four) times daily as needed (pain.).  05/02/18   Norval Morton, MD  diphenhydrAMINE (BENADRYL) 25 MG tablet Take 25 mg by mouth every 6 (six) hours as needed for allergies.    [provider]  EPINEPHrine (EPIPEN 2-PAK)  0.3 mg/0.3 mL IJ SOAJ injection Inject 0.3 mg into the muscle once as needed (severe allergic reaction).    [provider]  febuxostat (ULORIC) 40 MG tablet Take 40 mg by mouth daily. 11/10/17   [provider]  GLUCOSAMINE-CHONDROITIN PO Take 1 tablet by mouth 2 (two) times a week.     [provider]  guaiFENesin (MUCINEX) 600 MG 12 hr tablet Take 600 mg by mouth at bedtime as needed for cough or to loosen phlegm.     [provider]  hydrocortisone 2.5 % cream Apply 1 application topically daily as needed (itching).  01/08/18   [provider]  hydroxypropyl methylcellulose / hypromellose (ISOPTO TEARS / GONIOVISC) 2.5 % ophthalmic solution Place 1 drop into both eyes 3 (three) times daily.     [provider]  ipratropium (ATROVENT) 0.02 % nebulizer solution Take 2.5 mLs (0.5 mg total) by nebulization 4 (four) times daily. Patient taking differently: Take 0.5 mg by nebulization every 6 (six) hours as needed for wheezing or shortness of breath.  05/02/18   Norval Morton, MD  isosorbide mononitrate (IMDUR) 60 MG 24 hr tablet Take 60 mg by mouth daily. 03/02/19   [provider]  metolazone (ZAROXOLYN) 2.5 MG tablet Take 1 tablet (2.5 mg total) by mouth daily as needed (for weight gain greater than 3 pounds). Take one tablet on Monday and one tablet on Thursday. Patient taking differently: Take 2.5 mg by mouth daily as needed (for weight gain greater than 3 pounds).  01/27/19   Almyra Deforest, PA  nitroGLYCERIN (NITROSTAT) 0.4 MG SL tablet Place 1 tablet (0.4 mg total) under the tongue every 5 (five) minutes as needed for chest pain. 11/13/16 03/03/20  Erlene Quan, PA-C  ondansetron (ZOFRAN ODT) 4 MG disintegrating tablet Take 1 tablet (4 mg total) by mouth every 8 (eight) hours as needed for nausea or vomiting. 09/17/18   [name], MD  OXYGEN Place 3 L into the nose continuous.     [provider]  polyethylene glycol  (MIRALAX / GLYCOLAX) 17 g packet Take 17 g by mouth daily.    [provider]  potassium chloride SA (KLOR-CON) 20 MEQ tablet Take 1 tablet (20 mEq total) by mouth 2 (two) times daily. Patient taking differently: Take 40 mEq by mouth daily.  01/25/19   Croitoru, [name], MD  predniSONE (DELTASONE) 10 MG tablet Take 10 mg by mouth daily as needed (gout flares).  02/15/19   [provider]  rosuvastatin (CRESTOR) 20 MG tablet TAKE 1 TABLET AT BEDTIME Patient taking differently: Take 20 mg by mouth at bedtime.  11/26/18   Croitoru, [name], MD  saxagliptin HCl (ONGLYZA) 2.5 MG TABS tablet Take 2.5 mg by mouth daily.  12/08/17   Almyra Deforest, PA  torsemide (DEMADEX) 20 MG tablet Take 3 tablets (60 mg total) by mouth daily. 01/20/19   Croitoru, [name], MD  triamcinolone cream (KENALOG) 0.1 % Apply 1 application topically 2 (two) times daily as needed (itching).  08/24/14   [provider]    Allergies    Ivp dye [iodinated diagnostic agents], Shellfish allergy, Shellfish-derived products, Sulfa antibiotics, Iodine, Atorvastatin, Doxycycline, Cephalexin, and Zithromax [azithromycin]  Review of Systems   Review of Systems  All other systems reviewed and are negative.   Physical Exam Updated Vital Signs BP (!) 145/64 (BP Location: Right Arm)   Pulse 69   Temp 98.1 F (36.7 C) (Oral)   Resp 18   SpO2 100%   Physical Exam Vitals and nursing note reviewed.  Constitutional:      General: She is not in acute distress.    Appearance: She is well-developed.  HENT:     Head: Normocephalic and atraumatic.  Eyes:     Conjunctiva/sclera: Conjunctivae normal.  Cardiovascular:     Rate and Rhythm: Normal rate and regular rhythm.     Heart sounds: No murmur.  Pulmonary:     Effort: Pulmonary effort is normal. No respiratory distress.     Breath sounds: Normal breath sounds.  Abdominal:     Palpations: Abdomen is soft.     Tenderness: There is no abdominal tenderness.    Musculoskeletal:        General: Normal range of motion.     Cervical back: Neck supple.  Skin:    General: Skin is warm and dry.  Neurological:     Mental Status: She is alert and oriented to person, place, and time.  Psychiatric:        Mood and Affect: Mood normal.        Behavior: Behavior normal.     ED Results / Procedures / Treatments   Labs (all labs ordered are listed, but only abnormal results are displayed) Labs Reviewed  BASIC METABOLIC PANEL - Abnormal; Notable for the following components:      Result Value   Potassium 3.0 (*)    Chloride 92 (*)    CO2 37 (*)    Glucose, Bld 165 (*)    BUN 44 (*)    Creatinine, Ser 1.77 (*)    Calcium 8.6 (*)    GFR calc non Af Amer 28 (*)    GFR calc Af Amer 32 (*)    All other components within normal limits  CBC - Abnormal; Notable for the following components:   RBC 3.65 (*)    Hemoglobin 10.3 (*)    HCT 34.1 (*)    All other components within normal limits  BRAIN NATRIURETIC PEPTIDE - Abnormal; Notable for the following components:   B Natriuretic Peptide 227.3 (*)    All other components within normal limits  RESPIRATORY  PANEL BY RT PCR (FLU A&B, COVID)  MAGNESIUM  TROPONIN I (HIGH SENSITIVITY)  TROPONIN I (HIGH SENSITIVITY)    EKG EKG Interpretation  Date/Time:  Monday March 14 2019 17:39:43 EST Ventricular Rate:  72 PR Interval:    QRS Duration: 174 QT Interval:  476 QTC Calculation: 521 R Axis:   -147 Text Interpretation: Ventricular-paced rhythm Abnormal ECG Confirmed by Orpah Greek [phone]) on 03/15/2019 12:55:08 AM   Radiology DG Chest 2 View  Result Date: 03/14/2019 CLINICAL DATA:  Chest pain EXAM: CHEST - 2 VIEW COMPARISON:  October 10, 2018 FINDINGS: The heart size and mediastinal contours are stable. Cardiac pacemaker is unchanged. The heart size is enlarged. Mild increased pulmonary interstitium is identified bilaterally. There is no focal pneumonia or pleural effusion. The  visualized skeletal structures are unremarkable. IMPRESSION: Mild congestive heart failure. Electronically Signed   By: Abelardo Diesel M.D.   On: 03/14/2019 19:12    Procedures Procedures (including critical care time)  Medications Ordered in ED Medications  potassium chloride SA (KLOR-CON) CR tablet 40 mEq (has no administration in time range)    ED Course  I have reviewed the triage vital signs and the nursing notes.  Pertinent labs & imaging results that were available during my care of the patient were reviewed by me and considered in my medical decision making (see chart for details).    MDM Rules/Calculators/A&P                      Patient with chest pain that started yesterday.  Recent right and left heart catheterization 3 days ago.  Stable coronary vessels.  Medical management per Dr. Haroldine Laws. Check labs and chest x-ray.  Patient is on 3 L nasal cannula at baseline.  Potassium noted to be 3.0, will order supplemental potassium here.  Chest x-ray shows mild congestive heart failure.  BNP is mildly elevated at 227.  Troponin is 15, will check repeat troponin.  Repeat troponin is 16.  I discussed the case with Dr. Paticia Stack, who recommends starting Ranexa and for follow-up in the clinic.   Additionally, patient has fungal infection beneath right breast.  Will give clotrimazole cream here.   Final Clinical Impression(s) / ED Diagnoses Final diagnoses:  Chest pain, unspecified type  2. Yeast infection  Rx / DC Orders ED Discharge Orders         Ordered    ranolazine (RANEXA) 500 MG 12 hr tablet  2 times daily     03/15/19 0254           Montine Circle, PA-C 03/15/19 0259    Orpah Greek, MD 03/15/19 [phone]

## 2019-03-17 ENCOUNTER — Encounter (HOSPITAL_COMMUNITY): Payer: Medicare HMO

## 2019-03-18 ENCOUNTER — Other Ambulatory Visit: Payer: Self-pay | Admitting: Internal Medicine

## 2019-03-21 DIAGNOSIS — I2510 Atherosclerotic heart disease of native coronary artery without angina pectoris: Secondary | ICD-10-CM | POA: Diagnosis not present

## 2019-03-21 DIAGNOSIS — I10 Essential (primary) hypertension: Secondary | ICD-10-CM | POA: Diagnosis not present

## 2019-03-21 DIAGNOSIS — J4520 Mild intermittent asthma, uncomplicated: Secondary | ICD-10-CM | POA: Diagnosis not present

## 2019-03-21 DIAGNOSIS — E1169 Type 2 diabetes mellitus with other specified complication: Secondary | ICD-10-CM | POA: Diagnosis not present

## 2019-03-21 DIAGNOSIS — E1122 Type 2 diabetes mellitus with diabetic chronic kidney disease: Secondary | ICD-10-CM | POA: Diagnosis not present

## 2019-03-21 DIAGNOSIS — E7800 Pure hypercholesterolemia, unspecified: Secondary | ICD-10-CM | POA: Diagnosis not present

## 2019-03-21 DIAGNOSIS — I5032 Chronic diastolic (congestive) heart failure: Secondary | ICD-10-CM | POA: Diagnosis not present

## 2019-03-21 DIAGNOSIS — I5033 Acute on chronic diastolic (congestive) heart failure: Secondary | ICD-10-CM | POA: Diagnosis not present

## 2019-03-23 ENCOUNTER — Other Ambulatory Visit: Payer: Self-pay | Admitting: Cardiovascular Disease

## 2019-03-23 ENCOUNTER — Telehealth: Payer: Self-pay | Admitting: Cardiovascular Disease

## 2019-03-23 NOTE — Telephone Encounter (Signed)
Please tell her that I recommend notifying the person administering the shot that she has a history of allergic reactions and that she requires 30 minutes rather than 15 minutes of observation.  Otherwise I would still recommend going ahead with a shot.

## 2019-03-23 NOTE — Telephone Encounter (Signed)
Patient has an appointment to get her fist COVID vaccine tomorrow at 3:30. She said Dr. Loletha Grayer wants her to get the shot, but her kids are nervous about her getting the shot. She would just like to be absolutely certain she is safe to get it before she actually goes.  If she is not home, she will be at the grocery store with her son, Thayer Jew, and you can call his  Cell phone at  [phone]

## 2019-03-23 NOTE — Telephone Encounter (Signed)
Called and gave pt advice per Dr. Loletha Grayer. Verbalized understanding.

## 2019-03-28 ENCOUNTER — Ambulatory Visit (INDEPENDENT_AMBULATORY_CARE_PROVIDER_SITE_OTHER): Payer: Medicare HMO | Admitting: Cardiovascular Disease

## 2019-03-28 ENCOUNTER — Other Ambulatory Visit: Payer: Self-pay

## 2019-03-28 ENCOUNTER — Encounter: Payer: Self-pay | Admitting: Cardiovascular Disease

## 2019-03-28 VITALS — BP 90/59 | HR 72 | Temp 96.7°F | Ht 63.0 in | Wt 253.0 lb

## 2019-03-28 DIAGNOSIS — G4733 Obstructive sleep apnea (adult) (pediatric): Secondary | ICD-10-CM

## 2019-03-28 DIAGNOSIS — I2510 Atherosclerotic heart disease of native coronary artery without angina pectoris: Secondary | ICD-10-CM

## 2019-03-28 DIAGNOSIS — I442 Atrioventricular block, complete: Secondary | ICD-10-CM | POA: Diagnosis not present

## 2019-03-28 DIAGNOSIS — E782 Mixed hyperlipidemia: Secondary | ICD-10-CM | POA: Diagnosis not present

## 2019-03-28 DIAGNOSIS — Z950 Presence of cardiac pacemaker: Secondary | ICD-10-CM | POA: Diagnosis not present

## 2019-03-28 DIAGNOSIS — E1121 Type 2 diabetes mellitus with diabetic nephropathy: Secondary | ICD-10-CM | POA: Diagnosis not present

## 2019-03-28 DIAGNOSIS — I5032 Chronic diastolic (congestive) heart failure: Secondary | ICD-10-CM

## 2019-03-28 DIAGNOSIS — N1831 Chronic kidney disease, stage 3a: Secondary | ICD-10-CM

## 2019-03-28 DIAGNOSIS — E1122 Type 2 diabetes mellitus with diabetic chronic kidney disease: Secondary | ICD-10-CM

## 2019-03-28 DIAGNOSIS — I5042 Chronic combined systolic (congestive) and diastolic (congestive) heart failure: Secondary | ICD-10-CM | POA: Diagnosis not present

## 2019-03-28 IMAGING — DX DG WRIST COMPLETE 3+V*L*
5 series · 5 of 5 positions shown · non-contrast
Comparison: 10/19/2014 left wrist radiograph

CLINICAL DATA: 72 y/o  F; fall 1 week ago with persistent pain.

EXAM:
LEFT WRIST - COMPLETE 3+ VIEW

[dg wrist complete left (1 of 5)]
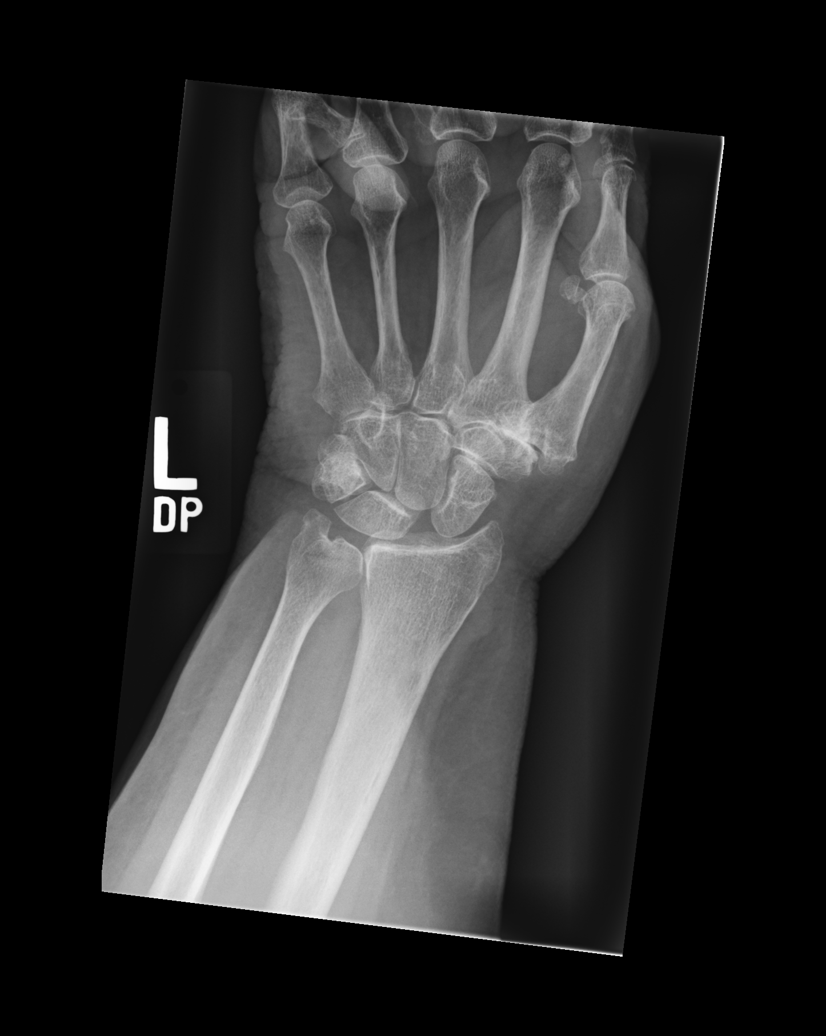

[dg wrist complete left (2 of 5)]
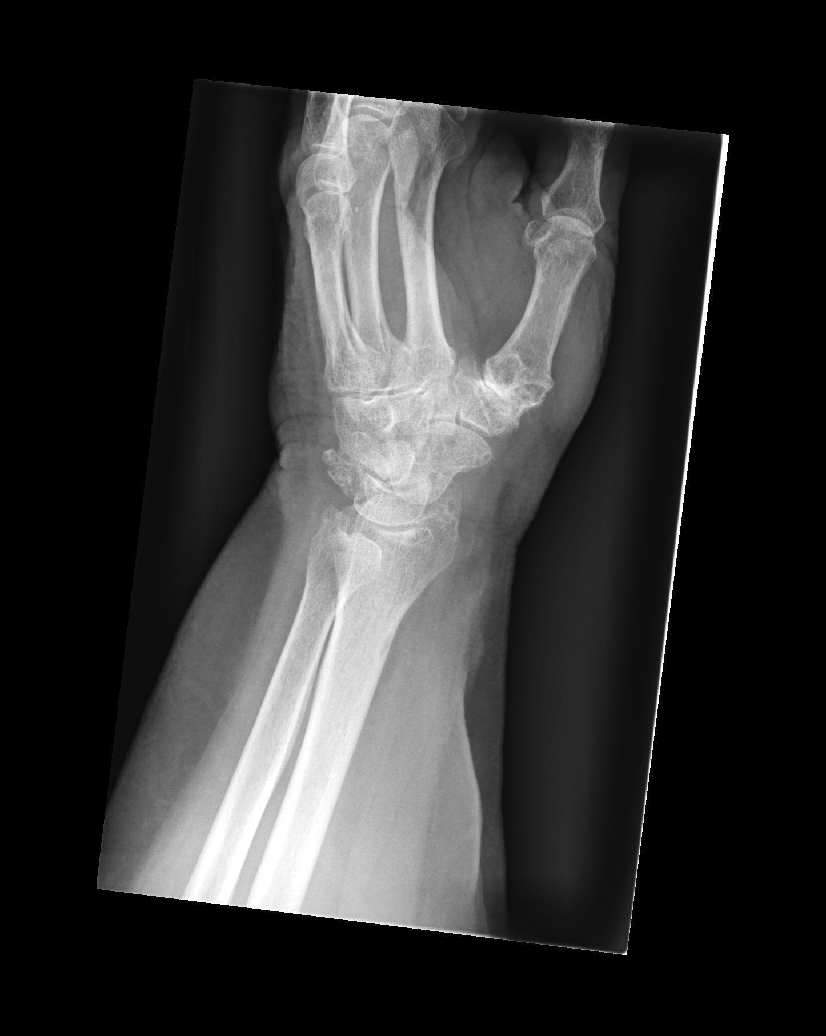

[dg wrist complete left (3 of 5)]
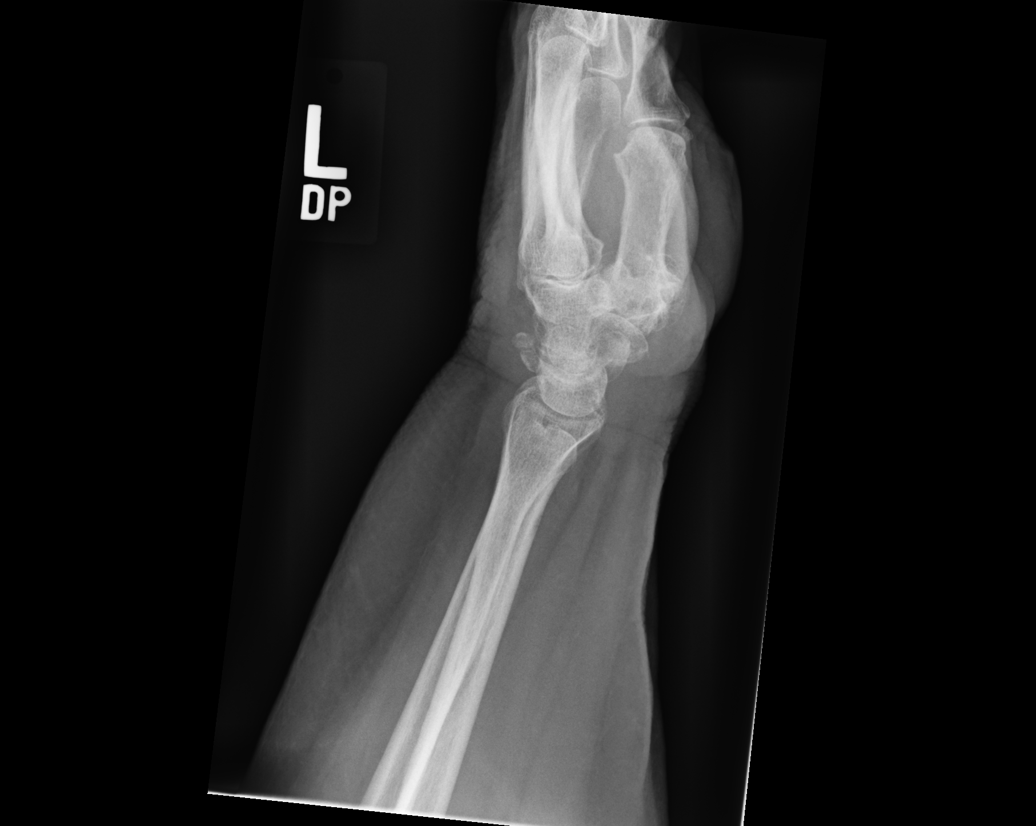

[dg wrist complete left (4 of 5)]
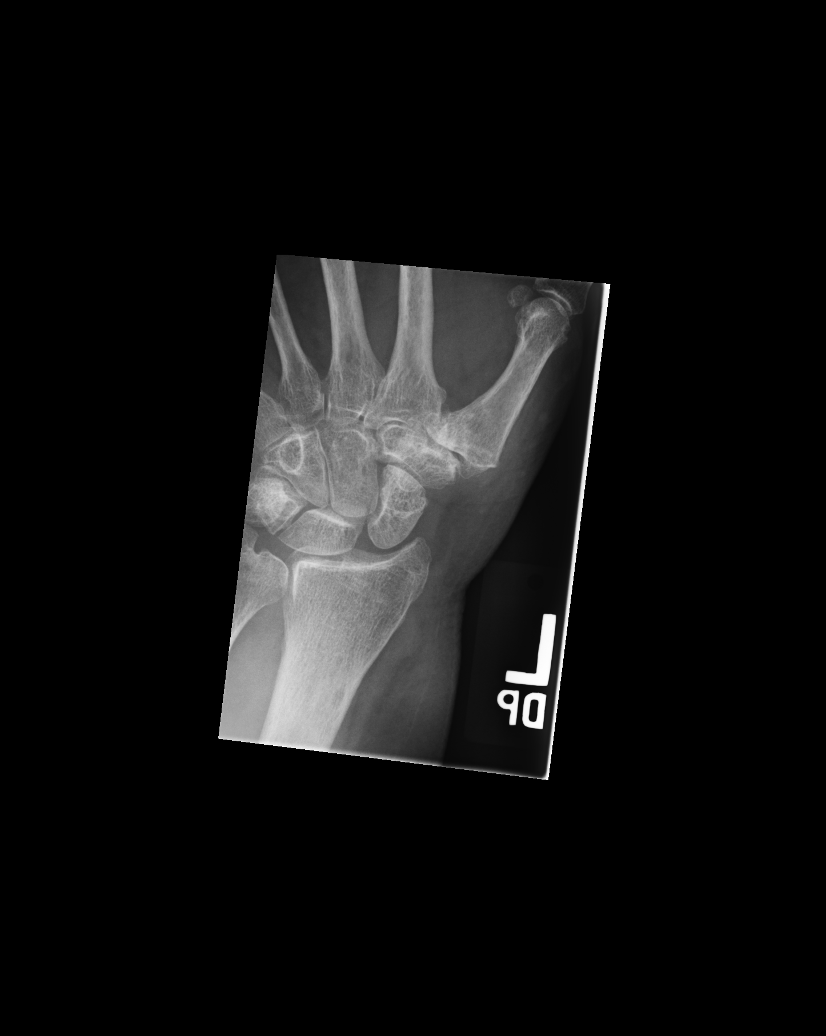

[dg wrist complete left (5 of 5)]
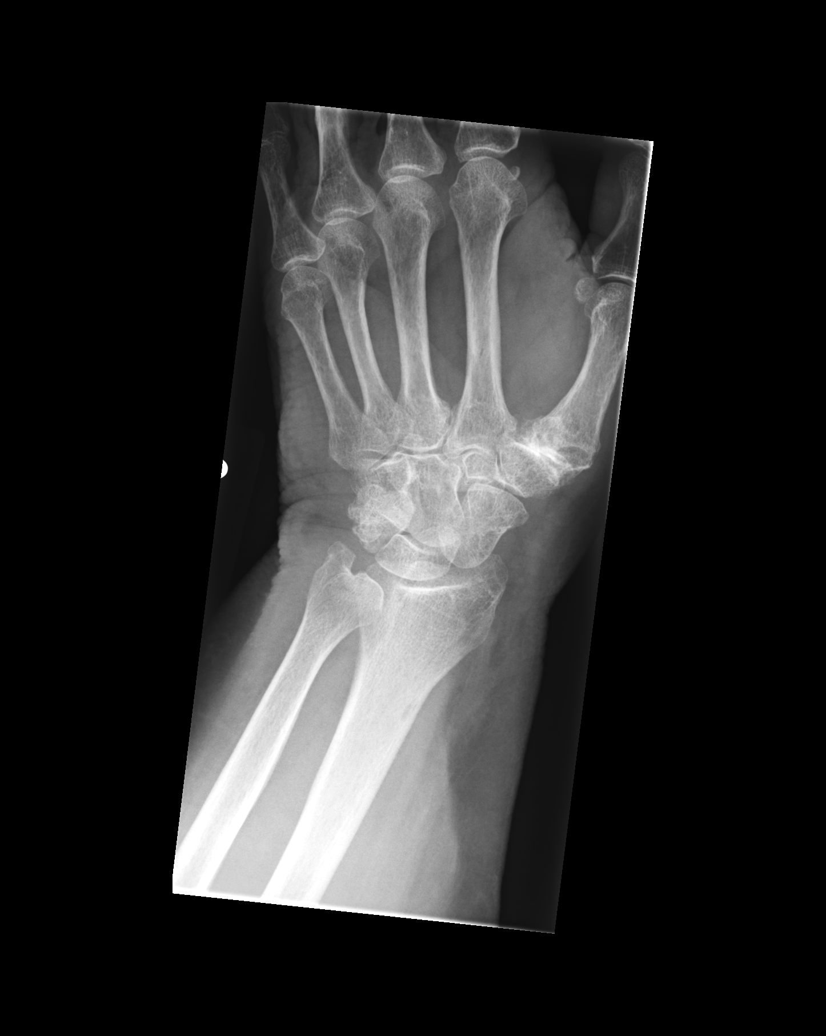

[5 of 5 positions shown; findings below may reference images not displayed]

FINDINGS: Scapholunate separation. Chronic triquetrum fracture. No additional
fracture identified. Osteoarthrosis of the basal joint with loss of
the joint space, articular surface sclerosis, and osteophytosis.
IMPRESSION: 1. Scapholunate separation.
2. Chronic triquetrum fracture.
3. Mild basal joint osteoarthrosis.

## 2019-03-28 MED ORDER — TORSEMIDE 20 MG PO TABS: 60.0000 mg | ORAL_TABLET | Freq: Every day | ORAL | 3 refills | Status: DC

## 2019-03-28 NOTE — Progress Notes (Signed)
Patient ID: [name], female   DOB: 1945/12/17, 74 y.o.   MRN: [id number] .    Cardiology Office Note    Date:  03/28/2019   ID:  [name], DOB March 25, 1945, MRN [id number]  PCP:  [facility]  Cardiologist:   Sanda Klein, MD   Chief Complaint  Patient presents with  . Congestive Heart Failure    History of Present Illness:  [name] is a 74 y.o. female with CAD s/p CABG October 2016 (LIMA to LAD, SVG to D1, SVG to OM1 and OM2, SVG to PDA), diastolic heart failure, morbid obesity, adrenal insufficiency, type 2 diabetes mellitus, Hypertension, stage III chronic kidney disease, gout, obstructive sleep apnea, asthma. Nonischemic cardiomyopathy and heart failure preceded the diagnosis of coronary disease. She has complete heart block and had a conventional pacemaker implanted in 2001. This was upgraded to a biventricular ICD implanted in 2008 for severe cardiomyopathy with ejection fraction 20%, downgraded to CRT-P in 2014 ([place]) following excellent response to resynchronization (hyper-responder, normalization of left ventricular systolic function). The current atrial and right ventricular leads were implanted in 2001, the left ventricular lead was implanted in 2008. The defibrillator lead implanted in 2008 is capped and its tip was resected at the time of bypass surgery. Ever since bypass surgery, atrial pacing thresholds have been extremely high, so her device is programmed VDD.   Developed symptoms of heart failure again in the fall of 2020 and echocardiogram 01/18/2019 showed EF had deteriorated, down to 40-45% (had been normal in 2017) and left atrial pressures were increased.  On 12/17 she was seen in the clinic and it was noted that she had lost left ventricular capture.  The output was increased in the coronary sinus lead.    Despite clinical improvement she still felt very short of breath and underwent right and left heart  catheterization with Dr. Ocie Doyne 03/11/2019.  Coronary anatomy was unchanged, with known occlusion of the SVG to OM but with other bypasses patent and with patent stents in the  native left circumflex coronary artery and ramus intermedius.  Left heart filling pressures were perfectly compensated with a wedge pressure of 14, but there was still evidence of mild pulmonary hypertension (PA 34/17, mean 24 mmHg, PVR 1.5, normal cardiac output with an index of 3.1 L/minute/m sq).  She has been trying to lose weight since then.  On the day of her heart catheterization she weighed 258 pounds.  Today in the office she weighs 253 pounds, she reports her home scale today showed 242 pounds.  Two days ago she noticed 3-4 pound weight gain and took metolazone.  Today she feels a little weak.  Her blood pressure is only 90/59 and she has cramps and believes that her potassium is probably low.   Her [employer] CRT-P device was implanted in 2014 and is programmed VDD due to extremely high atrial pacing thresholds (ever since she had bypass surgery).   Her EKG today shows atrial sensed biventricular paced rhythm with a clear-cut positive R wave in lead V1.  The QRS remains wide at 160 ms.  QTc 521 ms.  Her most recent creatinine was 1.77 on February 1.  Glycemic control is borderline adequate with an A1c of 7.3%.  Most recent LDL cholesterol was 99 on fairly high-dose rosuvastatin.  Past Medical History:  Diagnosis Date  . AICD (automatic cardioverter/defibrillator) present   . Anemia   . Arthritis   . Asthma   .  CAD (coronary artery disease)    a. 10/09/16 LHC: SVG->LAD patent, SVG->Diag patent, SVG->RCA patent, SVG->LCx occluded. EF 60%, b. 10/31/16 LHC DES to AV groove Circ, DES to intermed branch  . Cellulitis and abscess of foot 12/2014   RT FOOT  . CHF (congestive heart failure) ([place])   . Chronic bronchitis ([place])   . Chronic lower back pain   . Diverticulosis   . Facial numbness 02/2016  .  Fatty liver disease, nonalcoholic   . Gastritis   . GERD (gastroesophageal reflux disease)   . Gout   . H/O hiatal hernia   . High cholesterol   . Hyperplastic colon polyp   . Hypertension   . IBS (irritable bowel syndrome)   . Internal hemorrhoids   . Ischemic cardiomyopathy   . Obesity   . On home oxygen therapy    "2L at night" (10/31/2016)  . OSA (obstructive sleep apnea)    "can't tolerate a mask" (10/30/2016)  . Pneumonia    "couple times" (10/31/2016)  . Shortness of breath   . TIA (transient ischemic attack)    "recently" (10/31/2016)  . Type II diabetes mellitus ([place])     Past Surgical History:  Procedure Laterality Date  . ABDOMINAL ULTRASOUND  12/01/2011   Peripelvic cysts- #1- 2.4x1.9x2.3cm, #2-1.2x0.9x1.2cm  . ANKLE FRACTURE SURGERY Right    "had rod put in"  . ANTERIOR CERVICAL DECOMP/DISCECTOMY FUSION    . APPENDECTOMY    . BACK SURGERY    . BIOPSY  12/29/2017   Procedure: BIOPSY;  Surgeon: [name], MD;  [facility]: [facility];  Service: Endoscopy;;  . BIV ICD INSERTION CRT-D  2001?  [name] BIV PACEMAKER GENERATOR CHANGE OUT N/A 11/02/2012   Procedure: BIV PACEMAKER GENERATOR CHANGE OUT;  Surgeon: Sanda Klein, MD;  Location: [place] CATH LAB;  Service: Cardiovascular;  Laterality: N/A;  . CARDIAC CATHETERIZATION  05/17/1999   No significant coronary obstructive disease w/ mild 20% luminal irregularity of the first diag branch of the LAD  . CARDIAC CATHETERIZATION  07/08/2002   No significant CAD, moderately depressed LV systolic function  . CARDIAC CATHETERIZATION Bilateral 04/26/2007   Normal findings, recommend medical therapy  . CARDIAC CATHETERIZATION  02/18/2008   Moderate CAD, would benefit from having a functional study, recommend continue medical therapy  . CARDIAC CATHETERIZATION  07/23/2012   Medical therapy  . CARDIAC CATHETERIZATION N/A 11/24/2014   Procedure: Left Heart Cath and Coronary Angiography;  Surgeon: Troy Sine, MD;  Location: [place] CV LAB;  Service: Cardiovascular;  Laterality: N/A;  . CARDIAC CATHETERIZATION  11/27/2014   Procedure: Intravascular Pressure Wire/FFR Study;  Surgeon: Peter M Martinique, MD;  Location: [place] CV LAB;  Service: Cardiovascular;;  . CARDIAC CATHETERIZATION  10/09/2016  . CHOLECYSTECTOMY N/A 04/09/2018   Procedure: LAPAROSCOPIC CHOLECYSTECTOMY;  Surgeon: Greer Pickerel, MD;  [facility];  Service: General;  Laterality: N/A;  . COLONOSCOPY WITH PROPOFOL N/A 12/29/2017   Procedure: COLONOSCOPY WITH PROPOFOL;  Surgeon: [name], MD;  [facility];  Service: Endoscopy;  Laterality: N/A;  . CORONARY ANGIOGRAM  2010  . CORONARY ARTERY BYPASS GRAFT N/A 11/29/2014   Procedure: CORONARY ARTERY BYPASS GRAFTING x 5 (LIMA-LAD, SVG-D, SVG-OM1-OM2, SVG-PD);  Surgeon: Melrose Nakayama, MD;  Location: [place];  Service: Open Heart Surgery;  Laterality: N/A;  . CORONARY STENT INTERVENTION N/A 10/31/2016   Procedure: CORONARY STENT INTERVENTION;  Surgeon: Burnell Blanks, MD;  Location: [place] CV LAB;  Service: Cardiovascular;  Laterality: N/A;  . ESOPHAGOGASTRODUODENOSCOPY (EGD) WITH PROPOFOL N/A 12/29/2017   Procedure: ESOPHAGOGASTRODUODENOSCOPY (EGD) WITH PROPOFOL;  Surgeon: [name], MD;  [facility];  Service: Endoscopy;  Laterality: N/A;  . FRACTURE SURGERY    . INSERT / REPLACE / [place]  . KNEE ARTHROSCOPY Bilateral   . LEFT HEART CATH AND CORS/GRAFTS ANGIOGRAPHY N/A 12/09/2017   Procedure: LEFT HEART CATH AND CORS/GRAFTS ANGIOGRAPHY;  Surgeon: Troy Sine, MD;  Location: [place] CV LAB;  Service: Cardiovascular;  Laterality: N/A;  . LEFT HEART CATHETERIZATION WITH CORONARY ANGIOGRAM N/A 07/23/2012   Procedure: LEFT HEART CATHETERIZATION WITH CORONARY ANGIOGRAM;  Surgeon: Leonie Man, MD;  Location: [facility] CATH LAB;  Service: Cardiovascular;  Laterality: N/A;  [place] Adam MYOVIEW  11/14/2011   Mild-moderate defect  seen in Mid Inferolateral and Mid Anterolateral regions-consistant w/ infarct/scar. No significant ischemia demonstrated.  [name] POLYPECTOMY  12/29/2017   Procedure: POLYPECTOMY;  Surgeon: [name], MD;  [facility];  Service: Endoscopy;;  . RIGHT/LEFT HEART CATH AND CORONARY ANGIOGRAPHY N/A 10/09/2016   Procedure: RIGHT/LEFT HEART CATH AND CORONARY ANGIOGRAPHY;  Surgeon: Jolaine Artist, MD;  Location: [place] CV LAB;  Service: Cardiovascular;  Laterality: N/A;  . RIGHT/LEFT HEART CATH AND CORONARY/GRAFT ANGIOGRAPHY N/A 03/11/2019   Procedure: RIGHT/LEFT HEART CATH AND CORONARY/GRAFT ANGIOGRAPHY;  Surgeon: Jolaine Artist, MD;  Location: [place] CV LAB;  Service: Cardiovascular;  Laterality: N/A;  . TEE WITHOUT CARDIOVERSION N/A 11/29/2014   Procedure: TRANSESOPHAGEAL ECHOCARDIOGRAM (TEE);  Surgeon: Melrose Nakayama, MD;  Location: [place];  Service: Open Heart Surgery;  Laterality: N/A;  . TRANSTHORACIC ECHOCARDIOGRAM  07/23/2012   EF 55-60%, normal-mild  . TUBAL LIGATION      Current Medications: Outpatient Medications Prior to Visit  Medication Sig Dispense Refill  . acetaminophen (TYLENOL) 500 MG tablet Take 1,000 mg by mouth every 4 (four) hours as needed for headache (pain).     [name] albuterol (PROVENTIL HFA;VENTOLIN HFA) 108 (90 Base) MCG/ACT inhaler Inhale 2 puffs into the lungs every 6 (six) hours as needed for wheezing or shortness of breath. 1 Inhaler 0  . albuterol (PROVENTIL) (2.5 MG/3ML) 0.083% nebulizer solution Take 3 mLs (2.5 mg total) by nebulization every 6 (six) hours as needed for wheezing or shortness of breath. 75 mL 0  . aspirin EC 81 MG tablet Take 1 tablet (81 mg total) by mouth daily. 90 tablet 3  . Biotin 1000 MCG tablet Take 1,000 mcg by mouth daily.    . bisoprolol (ZEBETA) 5 MG tablet TAKE 1 TABLET EVERY DAY (Patient taking differently: Take 5 mg by mouth daily. ) 90 tablet 2  . budesonide-formoterol (SYMBICORT) 160-4.5 MCG/ACT  inhaler Inhale 2 puffs into the lungs 2 (two) times daily.    . cetirizine (ZYRTEC) 10 MG tablet Take 10 mg by mouth daily.     . clopidogrel (PLAVIX) 75 MG tablet TAKE 1 TABLET EVERY DAY 90 tablet 3  . clotrimazole (LOTRIMIN) 1 % cream Apply 1 application topically 2 (two) times daily as needed (rash).    . clotrimazole (MYCELEX) 10 MG troche Take 10 mg by mouth daily as needed (Sore Mouth/thrush).     . cycloSPORINE (RESTASIS) 0.05 % ophthalmic emulsion Place 1 drop into both eyes 2 (two) times daily.    . diclofenac sodium (VOLTAREN) 1 % GEL Apply 2 g topically 4 (four) times daily. (Patient taking differently: Apply 2 g topically 4 (four) times daily as needed (pain.). )  1 Tube 0  . diphenhydrAMINE (BENADRYL) 25 MG tablet Take 25 mg by mouth every 6 (six) hours as needed for allergies.    [name] EPINEPHrine (EPIPEN 2-PAK) 0.3 mg/0.3 mL IJ SOAJ injection Inject 0.3 mg into the muscle once as needed (severe allergic reaction).    . febuxostat (ULORIC) 40 MG tablet Take 40 mg by mouth daily.  3  . GLUCOSAMINE-CHONDROITIN PO Take 1 tablet by mouth 2 (two) times a week.     [name] guaiFENesin (MUCINEX) 600 MG 12 hr tablet Take 600 mg by mouth at bedtime as needed for cough or to loosen phlegm.     . hydrocortisone 2.5 % cream Apply 1 application topically daily as needed (itching).   1  . hydroxypropyl methylcellulose / hypromellose (ISOPTO TEARS / GONIOVISC) 2.5 % ophthalmic solution Place 1 drop into both eyes 3 (three) times daily.     [name] ipratropium (ATROVENT) 0.02 % nebulizer solution Take 2.5 mLs (0.5 mg total) by nebulization 4 (four) times daily. (Patient taking differently: Take 0.5 mg by nebulization every 6 (six) hours as needed for wheezing or shortness of breath. ) 75 mL 0  . isosorbide mononitrate (IMDUR) 60 MG 24 hr tablet Take 60 mg by mouth daily.    . metolazone (ZAROXOLYN) 2.5 MG tablet Take 1 tablet (2.5 mg total) by mouth daily as needed (for weight gain greater than 3 pounds). Take one  tablet on Monday and one tablet on Thursday. (Patient taking differently: Take 2.5 mg by mouth daily as needed (for weight gain greater than 3 pounds). )    . nitroGLYCERIN (NITROSTAT) 0.4 MG SL tablet Place 1 tablet (0.4 mg total) under the tongue every 5 (five) minutes as needed for chest pain. 25 tablet 3  . ondansetron (ZOFRAN ODT) 4 MG disintegrating tablet Take 1 tablet (4 mg total) by mouth every 8 (eight) hours as needed for nausea or vomiting. 20 tablet 0  . OXYGEN Place 3 L into the nose continuous.     . polyethylene glycol (MIRALAX / GLYCOLAX) 17 g packet Take 17 g by mouth daily as needed for moderate constipation.     . potassium chloride SA (KLOR-CON) 20 MEQ tablet Take 1 tablet (20 mEq total) by mouth 2 (two) times daily. (Patient taking differently: Take 40 mEq by mouth daily. ) 90 tablet 3  . predniSONE (DELTASONE) 10 MG tablet Take 10 mg by mouth daily as needed (gout flares).     . ranolazine (RANEXA) 500 MG 12 hr tablet Take 1 tablet (500 mg total) by mouth 2 (two) times daily. 60 tablet 0  . rosuvastatin (CRESTOR) 20 MG tablet TAKE 1 TABLET AT BEDTIME (Patient taking differently: Take 20 mg by mouth at bedtime. ) 90 tablet 1  . saxagliptin HCl (ONGLYZA) 2.5 MG TABS tablet Take 2.5 mg by mouth daily.     [name] triamcinolone cream (KENALOG) 0.1 % Apply 1 application topically 2 (two) times daily as needed (itching).     . torsemide (DEMADEX) 20 MG tablet Take 3 tablets (60 mg total) by mouth daily. (Patient taking differently: Take 20-40 mg by mouth See admin instructions. 40 mg in the morning, 20 mg in the evening) 90 tablet 3   No facility-administered medications prior to visit.     Allergies:   Ivp dye [iodinated diagnostic agents], Shellfish allergy, Shellfish-derived products, Sulfa antibiotics, Iodine, Atorvastatin, Doxycycline, Cephalexin, and Zithromax [azithromycin]   Social History   Socioeconomic History  . Marital status: Widowed  Spouse name: Not on file  .  Number of children: 5  . Years of education: Not on file  . Highest education level: Not on file  Occupational History  . Occupation: STAFF/BUFFET    Employer: [employer]  Tobacco Use  . Smoking status: Former Smoker    Packs/day: 0.25    Years: 3.00    Pack years: 0.75    Types: Cigarettes    Quit date: 02/11/1968    Years since quitting: 51.1  . Smokeless tobacco: Never Used  Substance and Sexual Activity  . Alcohol use: No  . Drug use: No  . Sexual activity: Never  Other Topics Concern  . Not on file  Social History Narrative   Widowed last year. She lives with her two sons.   Social Determinants of Health   Financial Resource Strain:   . Difficulty of Paying Living Expenses: Not on file  Food Insecurity:   . Worried About Charity fundraiser in the Last Year: Not on file  . Ran Out of Food in the Last Year: Not on file  Transportation Needs:   . Lack of Transportation (Medical): Not on file  . Lack of Transportation (Non-Medical): Not on file  Physical Activity:   . Days of Exercise per Week: Not on file  . Minutes of Exercise per Session: Not on file  Stress:   . Feeling of Stress : Not on file  Social Connections:   . Frequency of Communication with Friends and Family: Not on file  . Frequency of Social Gatherings with Friends and Family: Not on file  . Attends Religious Services: Not on file  . Active Member of Clubs or Organizations: Not on file  . Attends Archivist Meetings: Not on file  . Marital Status: Not on file     Family History:  The patient's family history includes Asthma in her sister; Breast cancer in her mother; Diabetes in her maternal grandmother and mother; Glaucoma in her maternal aunt; Heart disease in her maternal aunt and maternal grandmother; Kidney disease in her maternal grandmother.   ROS:   Please see the history of present illness.    ROS All other systems are reviewed and are negative.   PHYSICAL EXAM:     VS:  BP (!) 90/59   Pulse 72   Temp (!) 96.7 F (35.9 C)   Ht 5\' 3"  (1.6 m)   Wt 253 lb (114.8 kg)   SpO2 96%   BMI 44.82 kg/m     General: Alert, oriented x3, no distress, morbidly obese.  Healthy left subclavian device site Head: no evidence of trauma, PERRL, EOMI, no exophtalmos or lid lag, no myxedema, no xanthelasma; normal ears, nose and oropharynx Neck: Challenging to see the jugular venous pulsations or hepatojugular reflux; brisk carotid pulses without delay and no carotid bruits Chest: clear to auscultation, no signs of consolidation by percussion or palpation, normal fremitus, symmetrical and full respiratory excursions Cardiovascular: Unable to identify the apical impulse, regular rhythm, normal first and paradoxically split second heart sounds, no murmurs, rubs or gallops Abdomen: no tenderness or distention, no masses by palpation, no abnormal pulsatility or arterial bruits, normal bowel sounds, no hepatosplenomegaly Extremities: no clubbing, cyanosis or edema; 2+ radial, ulnar and brachial pulses bilaterally; 2+ right femoral, posterior tibial and dorsalis pedis pulses; 2+ left femoral, posterior tibial and dorsalis pedis pulses; no subclavian or femoral bruits Neurological: grossly nonfocal Psych: Normal mood and affect   Wt Readings  from Last 3 Encounters:  03/28/19 253 lb (114.8 kg)  03/11/19 257 lb 15 oz (117 kg)  03/02/19 257 lb 9.6 oz (116.8 kg)      Studies/Labs Reviewed:   EKG:  EKG is ordered today.  It shows atrial sensed biventricular paced rhythm with a positive R wave in lead V1, QRS 160 ms, QTC 521 ms.  Recent Labs: 05/01/2018: ALT 35 01/21/2019: NT-Pro BNP 929 03/14/2019: B Natriuretic Peptide 227.3; BUN 44; Creatinine, Ser 1.77; Hemoglobin 10.3; Platelets 154; Potassium 3.0; Sodium 139 03/15/2019: Magnesium 1.8   Lipid Panel    Component Value Date/Time   CHOL 186 01/21/2019 1045   TRIG 254 (H) 01/21/2019 1045   HDL 44 01/21/2019 1045   CHOLHDL  4.2 01/21/2019 1045   CHOLHDL 4.3 10/31/2016 0314   VLDL 36 10/31/2016 0314   LDLCALC 99 01/21/2019 1045   08/18/2018 Total cholesterol 193, triglycerides 187, HDL 52, LDL 103 Hemoglobin A1c 6.9% 11/25/2018  creatinine 2.13  ASSESSMENT:    1. Chronic diastolic heart failure ([place])   2. Coronary artery disease involving native coronary artery of native heart without angina pectoris   3. CHB (complete heart block) ([facility])   4. Status post biventricular pacemaker   5. OSA (obstructive sleep apnea)   6. Morbid (severe) obesity due to excess calories ([place])   7. Mixed hypercholesterolemia and hypertriglyceridemia   8. Type 2 diabetes mellitus with stage 3a chronic kidney disease, without long-term current use of insulin ([facility])   9. Stage 3a chronic kidney disease      PLAN:  In order of problems listed above:  1. CHF: Obesity makes it hard to assess her volume status, but I suspect that she might be mildly hypovolemic.  We will check labs today.  I think she is a little too eager to take the metolazone and have asked her to use this sparingly.  Whenever she does take it she should take double her usual dose of potassium supplement.  I think she had true exacerbation of heart failure in late 2020 due to loss of LV lead capture but she improved after reestablishing CRT. At the time of her recent cardiac catheterization had perfect left atrial filling pressures.  The remainder of her problems with shortness of breath are probably related to morbid obesity.   2. CAD s/p CABG: All major coronary territories have adequate coronary flow via bypasses or native vessels with stents.  Does not have angina.  On lifelong dual antiplatelet therapy.  Receiving statin.  On highly selective beta-blocker due to reactive airway disease. 3. CHB: Pacemaker dependent with 100% biventricular pacing 4. CRT-P: Now has appropriate LV capture, demonstrated on ECG.  Device is programmed VDD. The current right atrial lead  and right ventricular lead were placed in 2001. The current left ventricular lead was placed in 2008. Consider replacement of the right atrial lead at the time of her next generator change out.   5. OSA: Intolerant of CPAP; pulmonary hypertension due to sleep apnea and obesity hypoventilation syndrome are likely contributing to a large degree to her shortness of breath.  She also has significant reactive airway disease and has periodically required steroid therapy. 6. Morbid obesity: Has decided to follow the [employer] to lose weight.  Her son will do the same thing. 7. HLP: Despite a highly active statin, her LDL cholesterol remains above target.  I would like to avoid higher doses of rosuvastatin due to renal dysfunction.  Consider adding Zetia 10 mg  once daily.  Having said that, her list of medications is so long that I am not sure about the risk-benefit ratio. 8. DM: Borderline adequate control hemoglobin A1c 7.3% in January 2021  9. CKD 3: Creatinine 1.77, worse than her baseline; will recheck today.  Need to limit the use of metolazone.  Etiology of her dyspnea is multifactorial, including morbid obesity and restrictive physiology, asthma, pulmonary hypertension due to obesity and obstructive sleep apnea as well as heart failure    Medication Adjustments/Labs and Tests Ordered: Current medicines are reviewed at length with the patient today.  Concerns regarding medicines are outlined above.  Medication changes, Labs and Tests ordered today are listed in the Patient Instructions below. Patient Instructions  Medication Instructions:  No changes *If you need a refill on your cardiac medications before your next appointment, please call your pharmacy*  Lab Work: Your provider would like for you to have the following labs today: BMET and CBC  If you have labs (blood work) drawn today and your tests are completely normal, you will receive your results only by: [name] MyChart Message (if you  have MyChart) OR . A paper copy in the mail If you have any lab test that is abnormal or we need to change your treatment, we will call you to review the results.  Testing/Procedures: None ordered  Follow-Up: At [facility], you and your health needs are our priority.  As part of our continuing mission to provide you with exceptional heart care, we have created designated Provider Care Teams.  These Care Teams include your primary Cardiologist (physician) and Advanced Practice Providers (APPs -  Physician Assistants and Nurse Practitioners) who all work together to provide you with the care you need, when you need it.  Your next appointment:   3 month(s)  The format for your next appointment:   In Person  Provider:   Sanda Klein, MD      Signed, Sanda Klein, MD  03/28/2019 3:36 PM    [address] [place] [address], [place], [place]  [address] Phone: [phone]; Fax: [phone]

## 2019-03-28 NOTE — Patient Instructions (Signed)
Medication Instructions:  No changes *If you need a refill on your cardiac medications before your next appointment, please call your pharmacy*  Lab Work: Your provider would like for you to have the following labs today: BMET and CBC  If you have labs (blood work) drawn today and your tests are completely normal, you will receive your results only by: [name] MyChart Message (if you have MyChart) OR . A paper copy in the mail If you have any lab test that is abnormal or we need to change your treatment, we will call you to review the results.  Testing/Procedures: None ordered  Follow-Up: At [facility], you and your health needs are our priority.  As part of our continuing mission to provide you with exceptional heart care, we have created designated Provider Care Teams.  These Care Teams include your primary Cardiologist (physician) and Advanced Practice Providers (APPs -  Physician Assistants and Nurse Practitioners) who all work together to provide you with the care you need, when you need it.  Your next appointment:   3 month(s)  The format for your next appointment:   In Person  Provider:   Sanda Klein, MD

## 2019-03-29 ENCOUNTER — Ambulatory Visit: Payer: Self-pay

## 2019-03-29 ENCOUNTER — Other Ambulatory Visit: Payer: Self-pay

## 2019-03-29 ENCOUNTER — Other Ambulatory Visit: Payer: Self-pay | Admitting: *Deleted

## 2019-03-29 DIAGNOSIS — I5032 Chronic diastolic (congestive) heart failure: Secondary | ICD-10-CM

## 2019-03-29 LAB — CBC
Hematocrit: 36.1 % (ref 34.0–46.6)
Hemoglobin: 11.6 g/dL (ref 11.1–15.9)
MCH: 28.2 pg (ref 26.6–33.0)
MCHC: 32.1 g/dL (ref 31.5–35.7)
MCV: 88 fL (ref 79–97)
Platelets: 195 10*3/uL (ref 150–450)
RBC: 4.12 x10E6/uL (ref 3.77–5.28)
RDW: 13.9 % (ref 11.7–15.4)
WBC: 7.4 10*3/uL (ref 3.4–10.8)

## 2019-03-29 LAB — BASIC METABOLIC PANEL
BUN/Creatinine Ratio: 25 (ref 12–28)
BUN: 52 mg/dL — ABNORMAL HIGH (ref 8–27)
CO2: 31 mmol/L — ABNORMAL HIGH (ref 20–29)
Calcium: 9.9 mg/dL (ref 8.7–10.3)
Chloride: 92 mmol/L — ABNORMAL LOW (ref 96–106)
Creatinine, Ser: 2.12 mg/dL — ABNORMAL HIGH (ref 0.57–1.00)
GFR calc Af Amer: 26 mL/min/{1.73_m2} — ABNORMAL LOW (ref >59)
GFR calc non Af Amer: 22 mL/min/{1.73_m2} — ABNORMAL LOW (ref >59)
Glucose: 98 mg/dL (ref 65–99)
Potassium: 4.6 mmol/L (ref 3.5–5.2)
Sodium: 138 mmol/L (ref 134–144)

## 2019-03-29 NOTE — Patient Outreach (Signed)
[facility]) Care Management  03/29/2019  LEILANA MCQUIRE [birth year] [id number]    Messaged SCAT eligibility regarding status of application.  Application still being processed; should be processed by end of week.   Will follow up again next week and contact patient about eligibility determination.  Ronn Melena, BSW Social Worker [phone]

## 2019-03-31 ENCOUNTER — Telehealth: Payer: Self-pay | Admitting: Cardiovascular Disease

## 2019-03-31 NOTE — Telephone Encounter (Signed)
Spoke with patient. Patient has been without power and using her portable oxygen tank. She reports she reached out to [facility] and they asked her to pick up the tank from their office, patient has been unable to drive to location for pickup. She reports she spoke to the manager again and they were to deliver a new tank yesterday. She called three times and was told they were on their way, no tank arrived. Patient's portable tank is almost empty. Power has come back on but patient is worried it will go out again. Nurse reached out to Darlina Guys [phone]) at [facility] and she is working on getting patient oxygen now. Patient to call back with any further issues.

## 2019-03-31 NOTE — Telephone Encounter (Signed)
  Patient would like to speak to the nurse regarding her oxygen. She is having trouble getting oxygen to her house and [employer] told her to call the doctor to see if he could help her.

## 2019-04-03 DIAGNOSIS — R2689 Other abnormalities of gait and mobility: Secondary | ICD-10-CM | POA: Diagnosis not present

## 2019-04-04 ENCOUNTER — Ambulatory Visit: Payer: Self-pay

## 2019-04-05 ENCOUNTER — Other Ambulatory Visit: Payer: Self-pay

## 2019-04-05 NOTE — Patient Outreach (Signed)
[facility]) Care Management  04/05/2019  [name] 13-May-1945 [id number]   Received notification that patient was certified for [employer] [facility].  Contacted patient and informed her that she will receive certification packet via mail.   Case being closed.  Ronn Melena, BSW Social Worker [phone]

## 2019-04-07 ENCOUNTER — Other Ambulatory Visit: Payer: Self-pay | Admitting: Cardiovascular Disease

## 2019-04-07 ENCOUNTER — Other Ambulatory Visit: Payer: Self-pay | Admitting: Cardiology

## 2019-04-08 DIAGNOSIS — J9611 Chronic respiratory failure with hypoxia: Secondary | ICD-10-CM | POA: Diagnosis not present

## 2019-04-08 DIAGNOSIS — R269 Unspecified abnormalities of gait and mobility: Secondary | ICD-10-CM | POA: Diagnosis not present

## 2019-04-08 DIAGNOSIS — I5032 Chronic diastolic (congestive) heart failure: Secondary | ICD-10-CM | POA: Diagnosis not present

## 2019-04-08 DIAGNOSIS — R062 Wheezing: Secondary | ICD-10-CM | POA: Diagnosis not present

## 2019-04-19 ENCOUNTER — Other Ambulatory Visit: Payer: Self-pay

## 2019-04-19 MED ORDER — RANOLAZINE ER 500 MG PO TB12: 500.0000 mg | ORAL_TABLET | Freq: Two times a day (BID) | ORAL | 0 refills | Status: DC

## 2019-04-20 DIAGNOSIS — I10 Essential (primary) hypertension: Secondary | ICD-10-CM | POA: Diagnosis not present

## 2019-04-20 DIAGNOSIS — E119 Type 2 diabetes mellitus without complications: Secondary | ICD-10-CM | POA: Diagnosis not present

## 2019-04-20 DIAGNOSIS — M19072 Primary osteoarthritis, left ankle and foot: Secondary | ICD-10-CM | POA: Diagnosis not present

## 2019-04-20 DIAGNOSIS — M109 Gout, unspecified: Secondary | ICD-10-CM | POA: Diagnosis not present

## 2019-04-20 DIAGNOSIS — N189 Chronic kidney disease, unspecified: Secondary | ICD-10-CM | POA: Diagnosis not present

## 2019-04-20 DIAGNOSIS — Z8679 Personal history of other diseases of the circulatory system: Secondary | ICD-10-CM | POA: Diagnosis not present

## 2019-04-20 DIAGNOSIS — I2510 Atherosclerotic heart disease of native coronary artery without angina pectoris: Secondary | ICD-10-CM | POA: Diagnosis not present

## 2019-04-21 ENCOUNTER — Other Ambulatory Visit: Payer: Self-pay

## 2019-04-21 DIAGNOSIS — I2510 Atherosclerotic heart disease of native coronary artery without angina pectoris: Secondary | ICD-10-CM | POA: Diagnosis not present

## 2019-04-21 DIAGNOSIS — E7800 Pure hypercholesterolemia, unspecified: Secondary | ICD-10-CM | POA: Diagnosis not present

## 2019-04-21 DIAGNOSIS — I10 Essential (primary) hypertension: Secondary | ICD-10-CM | POA: Diagnosis not present

## 2019-04-21 DIAGNOSIS — N183 Chronic kidney disease, stage 3 unspecified: Secondary | ICD-10-CM | POA: Diagnosis not present

## 2019-04-21 DIAGNOSIS — J4520 Mild intermittent asthma, uncomplicated: Secondary | ICD-10-CM | POA: Diagnosis not present

## 2019-04-21 DIAGNOSIS — I5033 Acute on chronic diastolic (congestive) heart failure: Secondary | ICD-10-CM | POA: Diagnosis not present

## 2019-04-21 DIAGNOSIS — I5032 Chronic diastolic (congestive) heart failure: Secondary | ICD-10-CM | POA: Diagnosis not present

## 2019-04-21 DIAGNOSIS — E1122 Type 2 diabetes mellitus with diabetic chronic kidney disease: Secondary | ICD-10-CM | POA: Diagnosis not present

## 2019-04-21 DIAGNOSIS — E1169 Type 2 diabetes mellitus with other specified complication: Secondary | ICD-10-CM | POA: Diagnosis not present

## 2019-04-21 MED ORDER — RANOLAZINE ER 500 MG PO TB12: 500.0000 mg | ORAL_TABLET | Freq: Two times a day (BID) | ORAL | 3 refills | Status: DC

## 2019-04-27 IMAGING — DX PORTABLE CHEST - 1 VIEW
1 series · 1 of 1 positions shown · non-contrast
Comparison: 04/13/2018

CLINICAL DATA: Central left-sided chest pain which
shortness-of-breath and dry cough since [REDACTED].

EXAM:
PORTABLE CHEST 1 VIEW

[chest ap]
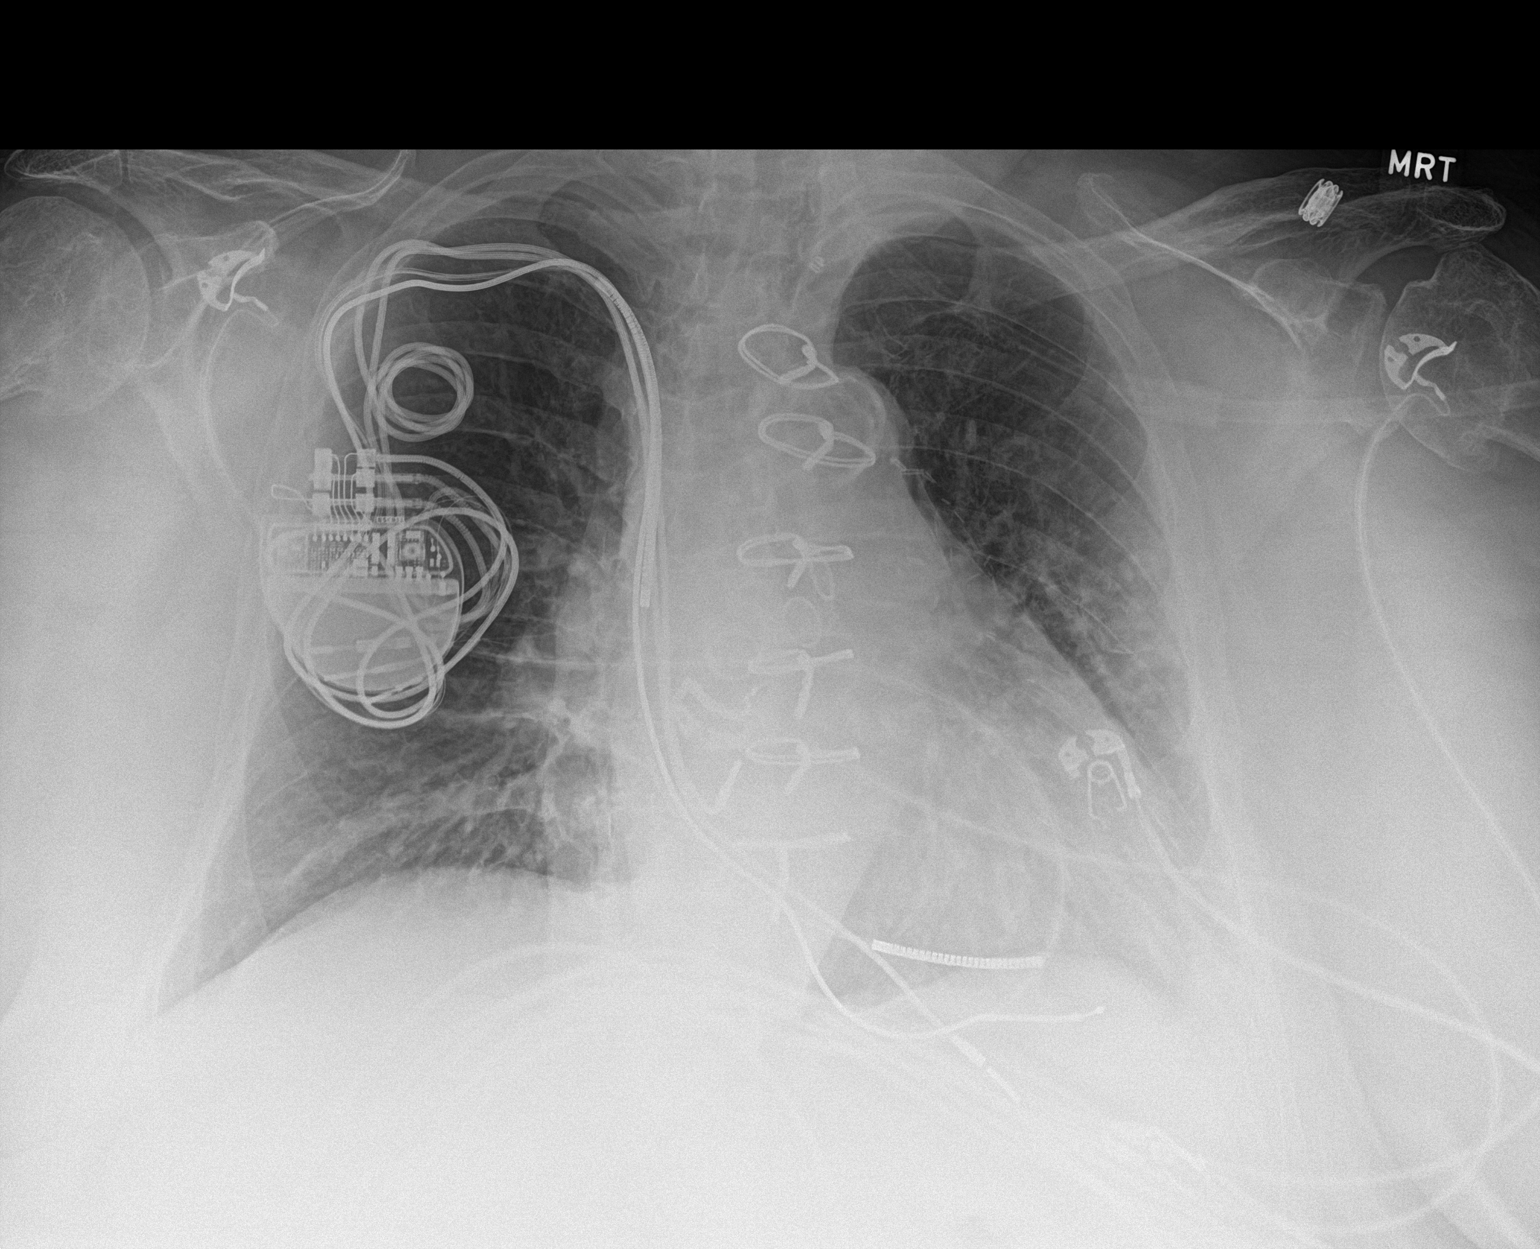

[1 of 1 positions shown; findings below may reference images not displayed]

FINDINGS: Right-sided pacemaker unchanged. Lungs are adequately inflated with
subtle patchy density over the periphery of the left mid lung.
Possible minimal blunting of the left costophrenic angle. Mild
stable cardiomegaly. Remainder of the exam is unchanged.
IMPRESSION: Subtle patchy density over the left midlung which may be due to
atelectasis or early infection. Possible small amount left pleural
fluid.

Mild stable cardiomegaly.

## 2019-05-06 DIAGNOSIS — I5032 Chronic diastolic (congestive) heart failure: Secondary | ICD-10-CM | POA: Diagnosis not present

## 2019-05-06 DIAGNOSIS — R269 Unspecified abnormalities of gait and mobility: Secondary | ICD-10-CM | POA: Diagnosis not present

## 2019-05-06 DIAGNOSIS — J9611 Chronic respiratory failure with hypoxia: Secondary | ICD-10-CM | POA: Diagnosis not present

## 2019-05-06 DIAGNOSIS — R062 Wheezing: Secondary | ICD-10-CM | POA: Diagnosis not present

## 2019-05-12 DIAGNOSIS — R2689 Other abnormalities of gait and mobility: Secondary | ICD-10-CM | POA: Diagnosis not present

## 2019-05-17 ENCOUNTER — Ambulatory Visit (INDEPENDENT_AMBULATORY_CARE_PROVIDER_SITE_OTHER): Payer: Medicare HMO | Admitting: *Deleted

## 2019-05-17 DIAGNOSIS — I442 Atrioventricular block, complete: Secondary | ICD-10-CM

## 2019-05-17 LAB — CUP PACEART REMOTE DEVICE CHECK
Battery Remaining Longevity: 36 mo
Battery Remaining Percentage: 52 %
Brady Statistic RA Percent Paced: 0 %
Brady Statistic RV Percent Paced: 100 %
Date Time Interrogation Session: 20210406003300
Implantable Lead Implant Date: 20080923
Implantable Lead Implant Date: 20080923
Implantable Lead Implant Date: 20080923
Implantable Lead Location: 753858
Implantable Lead Location: 753859
Implantable Lead Location: 753860
Implantable Lead Model: 4285
Implantable Lead Model: 4555
Implantable Lead Model: 7120
Implantable Lead Serial Number: 170220
Implantable Lead Serial Number: 486468
Implantable Pulse Generator Implant Date: 20140923
Lead Channel Impedance Value: 1186 Ohm
Lead Channel Impedance Value: 660 Ohm
Lead Channel Impedance Value: 731 Ohm
Lead Channel Pacing Threshold Amplitude: 0.7 V
Lead Channel Pacing Threshold Amplitude: 2.4 V
Lead Channel Pacing Threshold Pulse Width: 0.4 ms
Lead Channel Pacing Threshold Pulse Width: 2 ms
Lead Channel Setting Pacing Amplitude: 2 V
Lead Channel Setting Pacing Amplitude: 3 V
Lead Channel Setting Pacing Pulse Width: 0.4 ms
Lead Channel Setting Pacing Pulse Width: 2 ms
Lead Channel Setting Sensing Sensitivity: 2.5 mV
Lead Channel Setting Sensing Sensitivity: 2.5 mV
Pulse Gen Serial Number: 100731

## 2019-05-18 NOTE — Progress Notes (Signed)
PPM Remote  

## 2019-05-25 DIAGNOSIS — I5032 Chronic diastolic (congestive) heart failure: Secondary | ICD-10-CM | POA: Diagnosis not present

## 2019-05-31 ENCOUNTER — Encounter (HOSPITAL_COMMUNITY): Payer: Self-pay | Admitting: Emergency Medicine

## 2019-05-31 ENCOUNTER — Observation Stay (HOSPITAL_COMMUNITY)
Admission: EM | Admit: 2019-05-31 | Discharge: 2019-06-02 | Disposition: A | Discharge status: Home / Self Care | Payer: Medicare HMO | Attending: Internal Medicine | Admitting: Internal Medicine

## 2019-05-31 ENCOUNTER — Other Ambulatory Visit: Payer: Self-pay

## 2019-05-31 ENCOUNTER — Telehealth: Payer: Self-pay | Admitting: Cardiovascular Disease

## 2019-05-31 ENCOUNTER — Emergency Department (HOSPITAL_COMMUNITY): Payer: Medicare HMO

## 2019-05-31 DIAGNOSIS — Z79899 Other long term (current) drug therapy: Secondary | ICD-10-CM | POA: Diagnosis not present

## 2019-05-31 DIAGNOSIS — J9612 Chronic respiratory failure with hypercapnia: Secondary | ICD-10-CM | POA: Insufficient documentation

## 2019-05-31 DIAGNOSIS — N183 Chronic kidney disease, stage 3 unspecified: Secondary | ICD-10-CM | POA: Diagnosis not present

## 2019-05-31 DIAGNOSIS — I5042 Chronic combined systolic (congestive) and diastolic (congestive) heart failure: Secondary | ICD-10-CM | POA: Diagnosis not present

## 2019-05-31 DIAGNOSIS — R778 Other specified abnormalities of plasma proteins: Secondary | ICD-10-CM

## 2019-05-31 DIAGNOSIS — I2510 Atherosclerotic heart disease of native coronary artery without angina pectoris: Secondary | ICD-10-CM | POA: Insufficient documentation

## 2019-05-31 DIAGNOSIS — Z87891 Personal history of nicotine dependence: Secondary | ICD-10-CM | POA: Diagnosis not present

## 2019-05-31 DIAGNOSIS — Z7984 Long term (current) use of oral hypoglycemic drugs: Secondary | ICD-10-CM | POA: Insufficient documentation

## 2019-05-31 DIAGNOSIS — E1122 Type 2 diabetes mellitus with diabetic chronic kidney disease: Secondary | ICD-10-CM | POA: Insufficient documentation

## 2019-05-31 DIAGNOSIS — J9611 Chronic respiratory failure with hypoxia: Secondary | ICD-10-CM | POA: Insufficient documentation

## 2019-05-31 DIAGNOSIS — Z951 Presence of aortocoronary bypass graft: Secondary | ICD-10-CM | POA: Diagnosis not present

## 2019-05-31 DIAGNOSIS — E669 Obesity, unspecified: Secondary | ICD-10-CM | POA: Diagnosis present

## 2019-05-31 DIAGNOSIS — Z20822 Contact with and (suspected) exposure to covid-19: Secondary | ICD-10-CM | POA: Diagnosis not present

## 2019-05-31 DIAGNOSIS — N186 End stage renal disease: Secondary | ICD-10-CM | POA: Diagnosis present

## 2019-05-31 DIAGNOSIS — R0789 Other chest pain: Secondary | ICD-10-CM

## 2019-05-31 DIAGNOSIS — I130 Hypertensive heart and chronic kidney disease with heart failure and stage 1 through stage 4 chronic kidney disease, or unspecified chronic kidney disease: Principal | ICD-10-CM | POA: Insufficient documentation

## 2019-05-31 DIAGNOSIS — I10 Essential (primary) hypertension: Secondary | ICD-10-CM | POA: Diagnosis present

## 2019-05-31 DIAGNOSIS — R079 Chest pain, unspecified: Secondary | ICD-10-CM | POA: Diagnosis not present

## 2019-05-31 DIAGNOSIS — N189 Chronic kidney disease, unspecified: Secondary | ICD-10-CM | POA: Diagnosis present

## 2019-05-31 DIAGNOSIS — Z7982 Long term (current) use of aspirin: Secondary | ICD-10-CM | POA: Diagnosis not present

## 2019-05-31 DIAGNOSIS — J9610 Chronic respiratory failure, unspecified whether with hypoxia or hypercapnia: Secondary | ICD-10-CM | POA: Diagnosis present

## 2019-05-31 DIAGNOSIS — E119 Type 2 diabetes mellitus without complications: Secondary | ICD-10-CM

## 2019-05-31 DIAGNOSIS — Z9981 Dependence on supplemental oxygen: Secondary | ICD-10-CM | POA: Insufficient documentation

## 2019-05-31 DIAGNOSIS — E1169 Type 2 diabetes mellitus with other specified complication: Secondary | ICD-10-CM

## 2019-05-31 DIAGNOSIS — G4733 Obstructive sleep apnea (adult) (pediatric): Secondary | ICD-10-CM | POA: Diagnosis present

## 2019-05-31 DIAGNOSIS — J9690 Respiratory failure, unspecified, unspecified whether with hypoxia or hypercapnia: Secondary | ICD-10-CM | POA: Diagnosis present

## 2019-05-31 DIAGNOSIS — Z95810 Presence of automatic (implantable) cardiac defibrillator: Secondary | ICD-10-CM | POA: Diagnosis not present

## 2019-05-31 DIAGNOSIS — M25512 Pain in left shoulder: Secondary | ICD-10-CM | POA: Diagnosis not present

## 2019-05-31 LAB — CBC
HCT: 38.8 % (ref 36.0–46.0)
Hemoglobin: 11.7 g/dL — ABNORMAL LOW (ref 12.0–15.0)
MCH: 27.7 pg (ref 26.0–34.0)
MCHC: 30.2 g/dL (ref 30.0–36.0)
MCV: 91.9 fL (ref 80.0–100.0)
Platelets: 182 10*3/uL (ref 150–400)
RBC: 4.22 MIL/uL (ref 3.87–5.11)
RDW: 14.2 % (ref 11.5–15.5)
WBC: 7.5 10*3/uL (ref 4.0–10.5)
nRBC: 0 % (ref 0.0–0.2)

## 2019-05-31 LAB — BASIC METABOLIC PANEL
Anion gap: 11 (ref 5–15)
BUN: 60 mg/dL — ABNORMAL HIGH (ref 8–23)
CO2: 34 mmol/L — ABNORMAL HIGH (ref 22–32)
Calcium: 9.4 mg/dL (ref 8.9–10.3)
Chloride: 92 mmol/L — ABNORMAL LOW (ref 98–111)
Creatinine, Ser: 1.97 mg/dL — ABNORMAL HIGH (ref 0.44–1.00)
GFR calc Af Amer: 28 mL/min — ABNORMAL LOW (ref >60)
GFR calc non Af Amer: 24 mL/min — ABNORMAL LOW (ref >60)
Glucose, Bld: 147 mg/dL — ABNORMAL HIGH (ref 70–99)
Potassium: 3.3 mmol/L — ABNORMAL LOW (ref 3.5–5.1)
Sodium: 137 mmol/L (ref 135–145)

## 2019-05-31 LAB — TROPONIN I (HIGH SENSITIVITY)
Troponin I (High Sensitivity): 22 ng/L — ABNORMAL HIGH (ref <18)
Troponin I (High Sensitivity): 20 ng/L — ABNORMAL HIGH (ref <18)

## 2019-05-31 MED ORDER — ASPIRIN 81 MG PO CHEW
324.0000 mg | CHEWABLE_TABLET | Freq: Once | ORAL | Status: AC
  Administered 2019-05-31: 324 mg via ORAL
  Filled 2019-05-31: qty 4

## 2019-05-31 MED ORDER — SODIUM CHLORIDE 0.9% FLUSH: 3.0000 mL | Freq: Once | INTRAVENOUS | Status: DC

## 2019-05-31 NOTE — ED Triage Notes (Signed)
Pt states she started having CP on Sunday. Pain goes into left shoulder and her back. Pt took 1 nitro on Sunday and pain went away. Pain returned on Monday- pt called her MD and was told to come here. Pt states she has been nauseated, weak, and dizzy at times. Pt wears 3L [place] at home all the time.

## 2019-05-31 NOTE — Telephone Encounter (Signed)
Called the patient to check on her.  She took 3 NTG tablets and had no relief at all. Her chest, L shoulder and arm still "hurt really bad."  She refuses to call 911, but says she will have someone drive her to the ER immediately.   To Dr. [name] and his nurse as Juluis Rainier.

## 2019-05-31 NOTE — ED Provider Notes (Addendum)
[facility] EMERGENCY DEPARTMENT Provider Note   CSN: [id number] Arrival date & time: 05/31/19  1251     History Chief Complaint  Patient presents with  . Chest Pain    [name] is a 74 y.o. female.  HPI      [name] is a 74 y.o. female, with a history of AICD, asthma, CAD, CABG, CHF, asthma, HTN, hypercholesterolemia, obesity, DM, TIA presenting to the ED with chest pain beginning this morning.  Pain is central and left chest, sharp, constant, radiating to left shoulder and to the back, worse with exertion.  Improved with nitroglycerin. Intermittent shortness of breath.  She has taken a total of 4 nitroglycerin today. She has had similar episodes arise over the last few days.  These have all required nitroglycerin to resolve. She is chronically on 3 L supplemental O2.  She has been compliant with the rest of her medications. She called her cardiologist's office and was told to come to the emergency department. Denies fever/chills, syncope, dizziness, neurologic deficits, lower extremity edema/pain, acute abdominal pain, diaphoresis, N/V/D, cough, or any other complaints.  Cardiologist: Dr. [name]  Past Medical History:  Diagnosis Date  . AICD (automatic cardioverter/defibrillator) present   . Anemia   . Arthritis   . Asthma   . CAD (coronary artery disease)    a. 10/09/16 LHC: SVG->LAD patent, SVG->Diag patent, SVG->RCA patent, SVG->LCx occluded. EF 60%, b. 10/31/16 LHC DES to AV groove Circ, DES to intermed branch  . Cellulitis and abscess of foot 12/2014   RT FOOT  . CHF (congestive heart failure) ([place])   . Chronic bronchitis ([place])   . Chronic lower back pain   . Diverticulosis   . Facial numbness 02/2016  . Fatty liver disease, nonalcoholic   . Gastritis   . GERD (gastroesophageal reflux disease)   . Gout   . H/O hiatal hernia   . High cholesterol   . Hyperplastic colon polyp   . Hypertension   . IBS (irritable bowel  syndrome)   . Internal hemorrhoids   . Ischemic cardiomyopathy   . Obesity   . On home oxygen therapy    "2L at night" (10/31/2016)  . OSA (obstructive sleep apnea)    "can't tolerate a mask" (10/30/2016)  . Pneumonia    "couple times" (10/31/2016)  . Shortness of breath   . TIA (transient ischemic attack)    "recently" (10/31/2016)  . Type II diabetes mellitus [facility])     Patient Active Problem List   Diagnosis Date Noted  . Constipation 10/21/2018  . Leukopenia 05/03/2018  . Thrombocytopenia ([place]) 05/03/2018  . Pneumonia due to human metapneumovirus 05/02/2018  . Chest pain 05/01/2018  . Allergic rhinitis 04/13/2018  . S/P laparoscopic cholecystectomy 04/09/2018  . Chronic respiratory failure with hypoxia and hypercapnia ([place]) 03/07/2018  . Polyp of cecum   . Polyp of transverse colon   . Positive colorectal cancer screening using Cologuard test   . Dysphagia   . Acute on chronic renal failure ([place]) 11/20/2017  . CAD (coronary artery disease) 11/20/2017  . DM2 (diabetes mellitus, type 2) ([place]) 11/20/2017  . Abdominal pain, epigastric 11/20/2017  . Vasomotor rhinitis 10/16/2016  . Right facial numbness 03/05/2016  . CKD (chronic kidney disease), stage III 03/05/2016  . Atrial fibrillation ([place]) 10/24/2015  . Chest pain with moderate risk of acute coronary syndrome 10/23/2015  . History of TIA (transient ischemic attack) 10/22/2015  . Complete heart block ([place]) 06/22/2015  .  Allergic drug rash due to anti-infective agent 04/29/2015  . Fatigue 02/14/2015  . Chronic diastolic CHF (congestive heart failure) ([place]) 02/14/2015  . Cellulitis 12/21/2014  . S/P CABG x 5 11/29/2014  . CAD S/P percutaneous coronary angioplasty   . Diarrhea 07/12/2014  . Generalized abdominal pain 07/12/2014  . Diastolic CHF, acute on chronic (HCC) 11/04/2013  . Mixed hypercholesterolemia and hypertriglyceridemia 04/22/2013  . Vertigo 04/22/2013  . Dyspnea on exertion 08/25/2012  . Allergic to IV  contrast 07/23/2012  . Unstable angina ([place]) 07/22/2012  . Morbid (severe) obesity due to excess calories ([place]) 11/22/2011  . Nonischemic cardiomyopathy ([place]) 11/22/2011  . Gastroesophageal reflux disease 11/22/2011  . Back pain 11/22/2011  . OSA (obstructive sleep apnea) 11/22/2011  . HEMORRHOIDS-EXTERNAL 02/21/2010  . NAUSEA 02/21/2010  . ABDOMINAL PAIN -GENERALIZED 02/21/2010  . PERSONAL HX COLONIC POLYPS 02/21/2010  . ANEMIA 04/16/2007  . Essential hypertension 04/16/2007  . DIVERTICULOSIS, COLON 04/16/2007  . ARTHRITIS 04/16/2007  . INTERNAL HEMORRHOIDS WITHOUT MENTION COMP 04/12/2007  . Asthma 04/12/2007  . Cardiac resynchronization therapy pacemaker (CRT-P) in place 04/12/2007  . Gastritis without bleeding 12/14/2006  . COLONIC POLYPS, HYPERPLASTIC 09/27/2002  . FATTY LIVER DISEASE 02/16/2002    Past Surgical History:  Procedure Laterality Date  . ABDOMINAL ULTRASOUND  12/01/2011   Peripelvic cysts- #1- 2.4x1.9x2.3cm, #2-1.2x0.9x1.2cm  . ANKLE FRACTURE SURGERY Right    "had rod put in"  . ANTERIOR CERVICAL DECOMP/DISCECTOMY FUSION    . APPENDECTOMY    . BACK SURGERY    . BIOPSY  12/29/2017   Procedure: BIOPSY;  Surgeon: [name], MD;  [facility]: [facility];  Service: Endoscopy;;  . BIV ICD INSERTION CRT-D  2001?  [name] BIV PACEMAKER GENERATOR CHANGE OUT N/A 11/02/2012   Procedure: BIV PACEMAKER GENERATOR CHANGE OUT;  Surgeon: Sanda Klein, MD;  Location: [facility] CATH LAB;  Service: Cardiovascular;  Laterality: N/A;  . CARDIAC CATHETERIZATION  05/17/1999   No significant coronary obstructive disease w/ mild 20% luminal irregularity of the first diag branch of the LAD  . CARDIAC CATHETERIZATION  07/08/2002   No significant CAD, moderately depressed LV systolic function  . CARDIAC CATHETERIZATION Bilateral 04/26/2007   Normal findings, recommend medical therapy  . CARDIAC CATHETERIZATION  02/18/2008   Moderate CAD, would benefit from having a functional study, recommend  continue medical therapy  . CARDIAC CATHETERIZATION  07/23/2012   Medical therapy  . CARDIAC CATHETERIZATION N/A 11/24/2014   Procedure: Left Heart Cath and Coronary Angiography;  Surgeon: Troy Sine, MD;  Location: [facility] CV LAB;  Service: Cardiovascular;  Laterality: N/A;  . CARDIAC CATHETERIZATION  11/27/2014   Procedure: Intravascular Pressure Wire/FFR Study;  Surgeon: Peter M Martinique, MD;  Location: [facility] CV LAB;  Service: Cardiovascular;;  . CARDIAC CATHETERIZATION  10/09/2016  . CHOLECYSTECTOMY N/A 04/09/2018   Procedure: LAPAROSCOPIC CHOLECYSTECTOMY;  Surgeon: Greer Pickerel, MD;  [facility];  Service: General;  Laterality: N/A;  . COLONOSCOPY WITH PROPOFOL N/A 12/29/2017   Procedure: COLONOSCOPY WITH PROPOFOL;  Surgeon: [name], MD;  [facility];  Service: Endoscopy;  Laterality: N/A;  . CORONARY ANGIOGRAM  2010  . CORONARY ARTERY BYPASS GRAFT N/A 11/29/2014   Procedure: CORONARY ARTERY BYPASS GRAFTING x 5 (LIMA-LAD, SVG-D, SVG-OM1-OM2, SVG-PD);  Surgeon: Melrose Nakayama, MD;  Location: [facility];  Service: Open Heart Surgery;  Laterality: N/A;  . CORONARY STENT INTERVENTION N/A 10/31/2016   Procedure: CORONARY STENT INTERVENTION;  Surgeon: Burnell Blanks, MD;  Location: [facility] CV  LAB;  Service: Cardiovascular;  Laterality: N/A;  . ESOPHAGOGASTRODUODENOSCOPY (EGD) WITH PROPOFOL N/A 12/29/2017   Procedure: ESOPHAGOGASTRODUODENOSCOPY (EGD) WITH PROPOFOL;  Surgeon: [name], MD;  [facility];  Service: Endoscopy;  Laterality: N/A;  . FRACTURE SURGERY    . INSERT / REPLACE / [place]  . KNEE ARTHROSCOPY Bilateral   . LEFT HEART CATH AND CORS/GRAFTS ANGIOGRAPHY N/A 12/09/2017   Procedure: LEFT HEART CATH AND CORS/GRAFTS ANGIOGRAPHY;  Surgeon: Troy Sine, MD;  Location: [facility] CV LAB;  Service: Cardiovascular;  Laterality: N/A;  . LEFT HEART CATHETERIZATION WITH CORONARY ANGIOGRAM N/A  07/23/2012   Procedure: LEFT HEART CATHETERIZATION WITH CORONARY ANGIOGRAM;  Surgeon: Leonie Man, MD;  Location: [facility] CATH LAB;  Service: Cardiovascular;  Laterality: N/A;  Carlton Adam MYOVIEW  11/14/2011   Mild-moderate defect seen in Mid Inferolateral and Mid Anterolateral regions-consistant w/ infarct/scar. No significant ischemia demonstrated.  [name] POLYPECTOMY  12/29/2017   Procedure: POLYPECTOMY;  Surgeon: [name], MD;  [facility];  Service: Endoscopy;;  . RIGHT/LEFT HEART CATH AND CORONARY ANGIOGRAPHY N/A 10/09/2016   Procedure: RIGHT/LEFT HEART CATH AND CORONARY ANGIOGRAPHY;  Surgeon: Jolaine Artist, MD;  Location: [facility] CV LAB;  Service: Cardiovascular;  Laterality: N/A;  . RIGHT/LEFT HEART CATH AND CORONARY/GRAFT ANGIOGRAPHY N/A 03/11/2019   Procedure: RIGHT/LEFT HEART CATH AND CORONARY/GRAFT ANGIOGRAPHY;  Surgeon: Jolaine Artist, MD;  Location: [facility] CV LAB;  Service: Cardiovascular;  Laterality: N/A;  . TEE WITHOUT CARDIOVERSION N/A 11/29/2014   Procedure: TRANSESOPHAGEAL ECHOCARDIOGRAM (TEE);  Surgeon: Melrose Nakayama, MD;  Location: [facility];  Service: Open Heart Surgery;  Laterality: N/A;  . TRANSTHORACIC ECHOCARDIOGRAM  07/23/2012   EF 55-60%, normal-mild  . TUBAL LIGATION       OB History   No obstetric history on file.     Family History  Problem Relation Age of Onset  . Breast cancer Mother   . Diabetes Mother   . Heart disease Maternal Grandmother   . Kidney disease Maternal Grandmother   . Diabetes Maternal Grandmother   . Glaucoma Maternal Aunt   . Heart disease Maternal Aunt   . Asthma Sister   . Colon cancer Neg Hx   . Stomach cancer Neg Hx   . Pancreatic cancer Neg Hx     Social History   Tobacco Use  . Smoking status: Former Smoker    Packs/day: 0.25    Years: 3.00    Pack years: 0.75    Types: Cigarettes    Quit date: 02/11/1968    Years since quitting: 51.3  . Smokeless tobacco: Never Used  Substance  Use Topics  . Alcohol use: No  . Drug use: No    Home Medications Prior to Admission medications   Medication Sig Start Date End Date Taking? Authorizing Provider  acetaminophen (TYLENOL) 500 MG tablet Take 500-1,000 mg by mouth every 12 (twelve) hours as needed (for pain or headaches).    Yes [provider]  albuterol (PROVENTIL HFA;VENTOLIN HFA) 108 (90 Base) MCG/ACT inhaler Inhale 2 puffs into the lungs every 6 (six) hours as needed for wheezing or shortness of breath. 05/02/18  Yes Fuller Plan A, MD  albuterol (PROVENTIL) (2.5 MG/3ML) 0.083% nebulizer solution Take 3 mLs (2.5 mg total) by nebulization every 6 (six) hours as needed for wheezing or shortness of breath. 05/02/18  Yes Fuller Plan A, MD  aspirin EC 81 MG tablet Take 1 tablet (81 mg total) by mouth daily.  03/27/16  Yes Erlene Quan, PA-C  Biotin 1000 MCG tablet Take 1,000 mcg by mouth daily.   Yes [provider]  bisoprolol (ZEBETA) 5 MG tablet TAKE 1 TABLET EVERY DAY Patient taking differently: Take 5 mg by mouth daily.  04/07/19  Yes Kilroy, Luke K, PA-C  budesonide-formoterol (SYMBICORT) 160-4.5 MCG/ACT inhaler Inhale 2 puffs into the lungs 2 (two) times daily.   Yes [provider]  cetirizine (ZYRTEC) 10 MG tablet Take 10 mg by mouth daily.  03/24/17  Yes [provider]  clopidogrel (PLAVIX) 75 MG tablet TAKE 1 TABLET EVERY DAY Patient taking differently: Take 75 mg by mouth at bedtime.  03/23/19  Yes Croitoru, [name], MD  clotrimazole (MYCELEX) 10 MG troche Take 10 mg by mouth daily as needed (Sore Mouth/thrush).    Yes [provider]  colchicine 0.6 MG tablet Take 0.6 mg by mouth 2 (two) times daily as needed (for gout flares).   Yes [provider]  cycloSPORINE (RESTASIS) 0.05 % ophthalmic emulsion Place 1 drop into both eyes 2 (two) times daily.   Yes [provider]  diclofenac sodium (VOLTAREN) 1 % GEL Apply 2 g topically 4 (four) times  daily. Patient taking differently: Apply 2 g topically 4 (four) times daily as needed (pain.).  05/02/18  Yes Smith, Rondell A, MD  EPINEPHrine (EPIPEN 2-PAK) 0.3 mg/0.3 mL IJ SOAJ injection Inject 0.3 mg into the muscle once as needed (severe allergic reaction).   Yes [provider]  febuxostat (ULORIC) 40 MG tablet Take 40 mg by mouth daily. 11/10/17  Yes [provider]  guaiFENesin (MUCINEX) 600 MG 12 hr tablet Take 600 mg by mouth at bedtime as needed for cough or to loosen phlegm.    Yes [provider]  hydrocortisone 2.5 % cream Apply 1 application topically daily as needed (itching).  01/08/18  Yes [provider]  hydroxypropyl methylcellulose / hypromellose (ISOPTO TEARS / GONIOVISC) 2.5 % ophthalmic solution Place 1 drop into both eyes 3 (three) times daily.    Yes [provider]  ipratropium (ATROVENT) 0.02 % nebulizer solution Take 2.5 mLs (0.5 mg total) by nebulization 4 (four) times daily. Patient taking differently: Take 0.5 mg by nebulization every 6 (six) hours as needed for wheezing or shortness of breath.  05/02/18  Yes Fuller Plan A, MD  isosorbide mononitrate (IMDUR) 60 MG 24 hr tablet Take 60 mg by mouth daily. 03/02/19  Yes [provider]  metolazone (ZAROXOLYN) 2.5 MG tablet Take 1 tablet (2.5 mg total) by mouth daily as needed (for weight gain greater than 3 pounds). Take one tablet on Monday and one tablet on Thursday. Patient taking differently: Take 2.5 mg by mouth daily as needed (for an overnight weight gain of greater than 3 pounds).  01/27/19  Yes Almyra Deforest, PA  nitroGLYCERIN (NITROSTAT) 0.4 MG SL tablet Place 1 tablet (0.4 mg total) under the tongue every 5 (five) minutes as needed for chest pain. 11/13/16 03/03/20 Yes Kilroy, Luke K, PA-C  ondansetron (ZOFRAN ODT) 4 MG disintegrating tablet Take 1 tablet (4 mg total) by mouth every 8 (eight) hours as needed for nausea or vomiting. Patient taking differently: Take  4 mg by mouth every 8 (eight) hours as needed for nausea or vomiting (DISSOLVE IN THE MOUTH).  09/17/18  Yes Nandigam, Venia Minks, MD  OXYGEN Inhale 3 L/min into the lungs continuous.    Yes [provider]  polyethylene glycol (MIRALAX / GLYCOLAX) 17 g packet  Take 17 g by mouth in the morning.    Yes [provider]  potassium chloride SA (KLOR-CON) 20 MEQ tablet Take 1 tablet (20 mEq total) by mouth 2 (two) times daily. Patient taking differently: Take 20 mEq by mouth daily.  01/25/19  Yes Croitoru, [name], MD  predniSONE (DELTASONE) 10 MG tablet Take 10 mg by mouth daily as needed (as directed for gout flares).  02/15/19  Yes [provider]  ranolazine (RANEXA) 500 MG 12 hr tablet Take 1 tablet (500 mg total) by mouth 2 (two) times daily. 04/21/19  Yes Croitoru, [name], MD  rosuvastatin (CRESTOR) 20 MG tablet TAKE 1 TABLET AT BEDTIME Patient taking differently: Take 20 mg by mouth at bedtime.  04/07/19  Yes Croitoru, [name], MD  saxagliptin HCl (ONGLYZA) 2.5 MG TABS tablet Take 2.5 mg by mouth daily.  12/08/17  Yes Almyra Deforest, PA  spironolactone (ALDACTONE) 25 MG tablet Take 25 mg by mouth at bedtime.  03/31/19  Yes [provider]  torsemide (DEMADEX) 20 MG tablet Take 3 tablets (60 mg total) by mouth daily. Patient taking differently: Take 20-40 mg by mouth See admin instructions. Take 40 mg by mouth in the morning and 20 mg in the afternoon 03/28/19  Yes Croitoru, [name], MD  triamcinolone cream (KENALOG) 0.1 % Apply 1 application topically 2 (two) times daily as needed (itching).  08/24/14  Yes [provider]  clotrimazole (LOTRIMIN) 1 % cream Apply 1 application topically 2 (two) times daily as needed (rash). Patient not taking: Reported on 05/31/2019 05/02/18   Norval Morton, MD    Allergies    Ivp dye [iodinated diagnostic agents], Shellfish allergy, Shellfish-derived products, Sulfa antibiotics, Iodine, Atorvastatin, Benadryl [diphenhydramine],  Doxycycline, Cephalexin, and Zithromax [azithromycin]  Review of Systems   Review of Systems  Constitutional: Negative for chills, diaphoresis and fever.  Respiratory: Positive for shortness of breath. Negative for cough.   Cardiovascular: Positive for chest pain. Negative for leg swelling.  Gastrointestinal: Negative for abdominal pain, diarrhea, nausea and vomiting.  Neurological: Negative for dizziness, syncope and weakness.  All other systems reviewed and are negative.   Physical Exam Updated Vital Signs BP 117/61   Pulse 65   Temp 98.4 F (36.9 C) (Oral)   Resp (!) 22   Ht 5\' 3"  (1.6 m)   Wt 108.9 kg   SpO2 100%   BMI 42.51 kg/m   Physical Exam Vitals and nursing note reviewed.  Constitutional:      General: She is not in acute distress.    Appearance: She is well-developed. She is obese. She is not diaphoretic.  HENT:     Head: Normocephalic and atraumatic.     Mouth/Throat:     Mouth: Mucous membranes are moist.     Pharynx: Oropharynx is clear.  Eyes:     Conjunctiva/sclera: Conjunctivae normal.  Cardiovascular:     Rate and Rhythm: Normal rate and regular rhythm.     Pulses: Normal pulses.          Radial pulses are 2+ on the right side and 2+ on the left side.       Dorsalis pedis pulses are 2+ on the right side and 2+ on the left side.       Posterior tibial pulses are 2+ on the right side and 2+ on the left side.     Heart sounds: Normal heart sounds.     Comments: Tactile temperature in the extremities appropriate and equal bilaterally. Pulmonary:  Effort: Pulmonary effort is normal. No respiratory distress.     Breath sounds: Normal breath sounds.  Chest:    Abdominal:     Palpations: Abdomen is soft.     Tenderness: There is no abdominal tenderness. There is no guarding.  Musculoskeletal:     Cervical back: Neck supple.     Right lower leg: No edema.     Left lower leg: No edema.  Lymphadenopathy:     Cervical: No cervical adenopathy.   Skin:    General: Skin is warm and dry.  Neurological:     Mental Status: She is alert.     Comments: No noted acute cognitive deficit. Sensation grossly intact to light touch in the extremities.   Grip strengths equal bilaterally.   Strength 5/5 in all extremities.  Cranial nerves III-XII grossly intact.  Handles oral secretions without noted difficulty.  No noted phonation or speech deficit. No facial droop.   Psychiatric:        Mood and Affect: Mood and affect normal.        Speech: Speech normal.        Behavior: Behavior normal.     ED Results / Procedures / Treatments   Labs (all labs ordered are listed, but only abnormal results are displayed) Labs Reviewed  BASIC METABOLIC PANEL - Abnormal; Notable for the following components:      Result Value   Potassium 3.3 (*)    Chloride 92 (*)    CO2 34 (*)    Glucose, Bld 147 (*)    BUN 60 (*)    Creatinine, Ser 1.97 (*)    GFR calc non Af Amer 24 (*)    GFR calc Af Amer 28 (*)    All other components within normal limits  CBC - Abnormal; Notable for the following components:   Hemoglobin 11.7 (*)    All other components within normal limits  TROPONIN I (HIGH SENSITIVITY) - Abnormal; Notable for the following components:   Troponin I (High Sensitivity) 22 (*)    All other components within normal limits  TROPONIN I (HIGH SENSITIVITY) - Abnormal; Notable for the following components:   Troponin I (High Sensitivity) 20 (*)    All other components within normal limits  D-DIMER, QUANTITATIVE (NOT AT [facility])    BUN  Date Value Ref Range Status  05/31/2019 60 (H) 8 - 23 mg/dL Final  03/28/2019 52 (H) 8 - 27 mg/dL Final  03/14/2019 44 (H) 8 - 23 mg/dL Final  03/11/2019 38 (H) 8 - 23 mg/dL Final  03/02/2019 59 (H) 8 - 23 mg/dL Final  02/14/2019 20 8 - 27 mg/dL Final  02/01/2019 60 (H) 8 - 27 mg/dL Final  01/24/2019 55 (H) 8 - 27 mg/dL Final   Creat  Date Value Ref Range Status  04/04/2014 1.05 0.50 - 1.10 mg/dL Final   12/12/2013 1.31 (H) 0.50 - 1.10 mg/dL Final  11/16/2013 1.37 (H) 0.50 - 1.10 mg/dL Final  11/08/2013 1.32 (H) 0.50 - 1.10 mg/dL Final   Creatinine, Ser  Date Value Ref Range Status  05/31/2019 1.97 (H) 0.44 - 1.00 mg/dL Final  03/28/2019 2.12 (H) 0.57 - 1.00 mg/dL Final  03/14/2019 1.77 (H) 0.44 - 1.00 mg/dL Final  03/11/2019 1.62 (H) 0.44 - 1.00 mg/dL Final     EKG EKG Interpretation  Date/Time:  Tuesday May 31 2019 12:56:23 EDT Ventricular Rate:  72 PR Interval:  160 QRS Duration: 176 QT Interval:  524 QTC Calculation: 573  R Axis:   -87 Text Interpretation: Atrial-sensed ventricular-paced rhythm Abnormal ECG no change Confirmed by Charlesetta Shanks [phone]) on 05/31/2019 10:14:58 PM   Radiology DG Chest 2 View  Result Date: 05/31/2019 CLINICAL DATA:  Chest pain EXAM: CHEST - 2 VIEW COMPARISON:  03/14/2019 FINDINGS: There is no focal consolidation. There is no pleural effusion or pneumothorax. The heart and mediastinal contours are unremarkable. There is evidence of prior CABG. There is a cardiac pacemaker present. There is no acute osseous abnormality. IMPRESSION: No active cardiopulmonary disease. Electronically Signed   By: Kathreen Devoid   On: 05/31/2019 13:54    Procedures Procedures (including critical care time)  Medications Ordered in ED Medications  sodium chloride flush (NS) 0.9 % injection 3 mL (3 mLs Intravenous Not Given 05/31/19 2117)  morphine 4 MG/ML injection 4 mg (has no administration in time range)  ondansetron (ZOFRAN) injection 4 mg (has no administration in time range)  aspirin chewable tablet 324 mg (324 mg Oral Given 05/31/19 2215)    ED Course  I have reviewed the triage vital signs and the nursing notes.  Pertinent labs & imaging results that were available during my care of the patient were reviewed by me and considered in my medical decision making (see chart for details).  Clinical Course as of Jun 01 54  Tue May 31, 2019  2337 Spoke with  Dr. Radford Pax, cardiology fellow.  Reviewed the patient's symptoms, lab results, EKG. Recommends admission for observation overnight.  Also recommends obtaining D-dimer.   [SJ]  Wed Jun 01, 2019  0049 Spoke with Dr. Nicoletta Dress, hospitalist. Agrees to admit the patient.    [SJ]  8341 Upon review of the final plan with the patient, she complains of shoulder pain.  She does have tenderness to the left shoulder, superiorly, anteriorly, and laterally.  Pain with range of motion.  Normal pulses and normal tactile temperature when compared to the contralateral side.  Normal range of motion.  No noted swelling or color change.   [SJ]    Clinical Course User Index [SJ] Deshan Hemmelgarn C, PA-C   MDM Rules/Calculators/A&P                      Patient presents with chest pain intermittent for the last few days.  Pain has been constant today, worse with exertion, improves with nitroglycerin. I personally reviewed and interpreted the patient's labs and imaging studies. Atypical description with sharp pain, but otherwise suggestive of possible unstable angina. Patient's cardiac cath report from January 2021 did not show any acute abnormalities over previous caths.  Troponins very mildly elevated, but flat over trended samples. No acute abnormality on chest x-ray.  No acute abnormalities on EKG. Pacemaker interrogated, results submitted to medical records to be added to patient's record. Dissection was considered, but thought less likely base on: History and description of the pain are not suggestive, patient is not ill-appearing, lack of risk factors, equal bilateral pulses, lack of neurologic deficits, no widened mediastinum on chest x-ray.  Due to the patient's extensive cardiac history, I do suspect she would benefit from overnight observation.  Cardiology requests D-dimer.  This order was placed.  If positive, patient will need VQ scan rather than CT PE study due to her CKD, which would need to be performed in the  morning.   Findings and plan of care discussed with Charlesetta Shanks, MD.     Vitals:   06/01/19 0000 06/01/19 0010 06/01/19 0013 06/01/19 9622  BP:  (!) 142/61  (!) 127/54  Pulse: 64 69 65   Resp:      Temp:      TempSrc:      SpO2: 100% 100% 100%   Weight:      Height:         Final Clinical Impression(s) / ED Diagnoses Final diagnoses:  Atypical chest pain    Rx / DC Orders ED Discharge Orders    None       Layla Maw 06/01/19 0050    Lorayne Bender, PA-C 06/01/19 0056    Charlesetta Shanks, MD 06/01/19 1529

## 2019-05-31 NOTE — Telephone Encounter (Signed)
The patient reports constant, stabbing CP (pain scale 9/10) for 3 days. The pain starts in her chest and radiates around her left side to her back and shoulder down her left arm. Her stomach has been bothering her and she has nausea and facial flushing, which seems to be a little better today.   She took 1 NTG tablet last night and her symptoms were relieved almost completely, but the symptoms returned pretty quickly.   Reviewed NTG instructions with patient and instructed her to take one NOW. She will take another in 5 minutes if symptoms have not improved and a 3rd 5 minutes after. She understands to call 911 if symptoms are not relieved after 3 tablets.   Will call patient again to check on her.

## 2019-05-31 NOTE — ED Notes (Signed)
Pacemaker interrogated. 

## 2019-05-31 NOTE — Telephone Encounter (Signed)
Pt c/o of Chest Pain: STAT if CP now or developed within 24 hours  1. Are you having CP right now? yes  2. Are you experiencing any other symptoms (ex. SOB, nausea, vomiting, sweating)?  Nausea, flushed feeling  3. How long have you been experiencing CP? 3 days  4. Is your CP continuous or coming and going? continuous  5. Have you taken Nitroglycerin? Yes. ?  Pt also has a hx of cellulitis and thinks she is having a flare-up

## 2019-06-01 ENCOUNTER — Emergency Department (HOSPITAL_COMMUNITY): Payer: Medicare HMO

## 2019-06-01 ENCOUNTER — Encounter (HOSPITAL_COMMUNITY): Payer: Self-pay | Admitting: Internal Medicine

## 2019-06-01 ENCOUNTER — Observation Stay (HOSPITAL_BASED_OUTPATIENT_CLINIC_OR_DEPARTMENT_OTHER): Payer: Medicare HMO

## 2019-06-01 ENCOUNTER — Observation Stay (HOSPITAL_COMMUNITY): Payer: Medicare HMO

## 2019-06-01 DIAGNOSIS — R0789 Other chest pain: Secondary | ICD-10-CM

## 2019-06-01 DIAGNOSIS — R778 Other specified abnormalities of plasma proteins: Secondary | ICD-10-CM

## 2019-06-01 DIAGNOSIS — I2510 Atherosclerotic heart disease of native coronary artery without angina pectoris: Secondary | ICD-10-CM | POA: Diagnosis not present

## 2019-06-01 DIAGNOSIS — I350 Nonrheumatic aortic (valve) stenosis: Secondary | ICD-10-CM

## 2019-06-01 DIAGNOSIS — Z20822 Contact with and (suspected) exposure to covid-19: Secondary | ICD-10-CM | POA: Diagnosis not present

## 2019-06-01 DIAGNOSIS — M25512 Pain in left shoulder: Secondary | ICD-10-CM | POA: Diagnosis not present

## 2019-06-01 DIAGNOSIS — I200 Unstable angina: Secondary | ICD-10-CM | POA: Diagnosis not present

## 2019-06-01 DIAGNOSIS — R079 Chest pain, unspecified: Secondary | ICD-10-CM

## 2019-06-01 DIAGNOSIS — I5042 Chronic combined systolic (congestive) and diastolic (congestive) heart failure: Secondary | ICD-10-CM

## 2019-06-01 DIAGNOSIS — I10 Essential (primary) hypertension: Secondary | ICD-10-CM

## 2019-06-01 DIAGNOSIS — N183 Chronic kidney disease, stage 3 unspecified: Secondary | ICD-10-CM | POA: Diagnosis not present

## 2019-06-01 DIAGNOSIS — E1122 Type 2 diabetes mellitus with diabetic chronic kidney disease: Secondary | ICD-10-CM | POA: Diagnosis not present

## 2019-06-01 DIAGNOSIS — I509 Heart failure, unspecified: Secondary | ICD-10-CM | POA: Diagnosis not present

## 2019-06-01 DIAGNOSIS — J9611 Chronic respiratory failure with hypoxia: Secondary | ICD-10-CM | POA: Diagnosis not present

## 2019-06-01 DIAGNOSIS — Z9861 Coronary angioplasty status: Secondary | ICD-10-CM | POA: Diagnosis not present

## 2019-06-01 DIAGNOSIS — J9612 Chronic respiratory failure with hypercapnia: Secondary | ICD-10-CM | POA: Diagnosis not present

## 2019-06-01 DIAGNOSIS — I130 Hypertensive heart and chronic kidney disease with heart failure and stage 1 through stage 4 chronic kidney disease, or unspecified chronic kidney disease: Secondary | ICD-10-CM | POA: Diagnosis not present

## 2019-06-01 LAB — ECHOCARDIOGRAM COMPLETE
Height: 63 in
Weight: 3840 oz

## 2019-06-01 LAB — TSH: TSH: 2.733 u[IU]/mL (ref 0.350–4.500)

## 2019-06-01 LAB — COMPREHENSIVE METABOLIC PANEL
ALT: 18 U/L (ref 0–44)
AST: 21 U/L (ref 15–41)
Albumin: 3.3 g/dL — ABNORMAL LOW (ref 3.5–5.0)
Alkaline Phosphatase: 74 U/L (ref 38–126)
Anion gap: 12 (ref 5–15)
BUN: 53 mg/dL — ABNORMAL HIGH (ref 8–23)
CO2: 32 mmol/L (ref 22–32)
Calcium: 9.3 mg/dL (ref 8.9–10.3)
Chloride: 95 mmol/L — ABNORMAL LOW (ref 98–111)
Creatinine, Ser: 1.85 mg/dL — ABNORMAL HIGH (ref 0.44–1.00)
GFR calc Af Amer: 30 mL/min — ABNORMAL LOW (ref >60)
GFR calc non Af Amer: 26 mL/min — ABNORMAL LOW (ref >60)
Glucose, Bld: 142 mg/dL — ABNORMAL HIGH (ref 70–99)
Potassium: 3.4 mmol/L — ABNORMAL LOW (ref 3.5–5.1)
Sodium: 139 mmol/L (ref 135–145)
Total Bilirubin: 0.6 mg/dL (ref 0.3–1.2)
Total Protein: 6.5 g/dL (ref 6.5–8.1)

## 2019-06-01 LAB — SARS CORONAVIRUS 2 (TAT 6-24 HRS): SARS Coronavirus 2: NEGATIVE

## 2019-06-01 LAB — D-DIMER, QUANTITATIVE: D-Dimer, Quant: 1.57 ug/mL-FEU — ABNORMAL HIGH (ref 0.00–0.50)

## 2019-06-01 LAB — TROPONIN I (HIGH SENSITIVITY)
Troponin I (High Sensitivity): 19 ng/L — ABNORMAL HIGH (ref <18)
Troponin I (High Sensitivity): 19 ng/L — ABNORMAL HIGH (ref <18)

## 2019-06-01 LAB — GLUCOSE, CAPILLARY
Glucose-Capillary: 114 mg/dL — ABNORMAL HIGH (ref 70–99)
Glucose-Capillary: 127 mg/dL — ABNORMAL HIGH (ref 70–99)
Glucose-Capillary: 166 mg/dL — ABNORMAL HIGH (ref 70–99)
Glucose-Capillary: 178 mg/dL — ABNORMAL HIGH (ref 70–99)

## 2019-06-01 LAB — HEMOGLOBIN A1C
Hgb A1c MFr Bld: 7.1 % — ABNORMAL HIGH (ref 4.8–5.6)
Mean Plasma Glucose: 157.07 mg/dL

## 2019-06-01 MED ORDER — COLCHICINE 0.6 MG PO TABS: 0.6000 mg | ORAL_TABLET | Freq: Two times a day (BID) | ORAL | Status: DC | PRN

## 2019-06-01 MED ORDER — ALBUTEROL SULFATE (2.5 MG/3ML) 0.083% IN NEBU: 2.5000 mg | INHALATION_SOLUTION | Freq: Four times a day (QID) | RESPIRATORY_TRACT | Status: DC | PRN

## 2019-06-01 MED ORDER — ACETAMINOPHEN 650 MG RE SUPP: 650.0000 mg | Freq: Four times a day (QID) | RECTAL | Status: DC | PRN

## 2019-06-01 MED ORDER — RANOLAZINE ER 500 MG PO TB12
1000.0000 mg | ORAL_TABLET | Freq: Two times a day (BID) | ORAL | Status: DC
  Administered 2019-06-01 – 2019-06-02 (×2): 1000 mg via ORAL
  Filled 2019-06-01 (×2): qty 2

## 2019-06-01 MED ORDER — ISOSORBIDE MONONITRATE ER 60 MG PO TB24
90.0000 mg | ORAL_TABLET | Freq: Every day | ORAL | Status: DC
  Administered 2019-06-02: 90 mg via ORAL
  Filled 2019-06-01: qty 1

## 2019-06-01 MED ORDER — INSULIN ASPART 100 UNIT/ML ~~LOC~~ SOLN
0.0000 [IU] | Freq: Three times a day (TID) | SUBCUTANEOUS | Status: DC
  Administered 2019-06-01: 2 [IU] via SUBCUTANEOUS
  Administered 2019-06-02: 1 [IU] via SUBCUTANEOUS

## 2019-06-01 MED ORDER — ONDANSETRON HCL 4 MG/2ML IJ SOLN: 4.0000 mg | Freq: Four times a day (QID) | INTRAMUSCULAR | Status: DC | PRN

## 2019-06-01 MED ORDER — TORSEMIDE 20 MG PO TABS: 20.0000 mg | ORAL_TABLET | ORAL | Status: DC

## 2019-06-01 MED ORDER — ACETAMINOPHEN 325 MG PO TABS
650.0000 mg | ORAL_TABLET | Freq: Four times a day (QID) | ORAL | Status: DC | PRN
  Administered 2019-06-01: 650 mg via ORAL
  Filled 2019-06-01: qty 2

## 2019-06-01 MED ORDER — ONDANSETRON HCL 4 MG PO TABS: 4.0000 mg | ORAL_TABLET | Freq: Four times a day (QID) | ORAL | Status: DC | PRN

## 2019-06-01 MED ORDER — ASPIRIN EC 81 MG PO TBEC
81.0000 mg | DELAYED_RELEASE_TABLET | Freq: Every day | ORAL | Status: DC
  Administered 2019-06-01 – 2019-06-02 (×2): 81 mg via ORAL
  Filled 2019-06-01 (×2): qty 1

## 2019-06-01 MED ORDER — POTASSIUM CHLORIDE CRYS ER 20 MEQ PO TBCR
40.0000 meq | EXTENDED_RELEASE_TABLET | Freq: Once | ORAL | Status: AC
  Administered 2019-06-01: 40 meq via ORAL
  Filled 2019-06-01: qty 2

## 2019-06-01 MED ORDER — RANOLAZINE ER 500 MG PO TB12
500.0000 mg | ORAL_TABLET | Freq: Once | ORAL | Status: AC
  Administered 2019-06-01: 500 mg via ORAL
  Filled 2019-06-01: qty 1

## 2019-06-01 MED ORDER — TORSEMIDE 20 MG PO TABS
40.0000 mg | ORAL_TABLET | Freq: Every day | ORAL | Status: DC
  Administered 2019-06-01 – 2019-06-02 (×2): 40 mg via ORAL
  Filled 2019-06-01 (×3): qty 2

## 2019-06-01 MED ORDER — ISOSORBIDE MONONITRATE ER 60 MG PO TB24
60.0000 mg | ORAL_TABLET | Freq: Every day | ORAL | Status: DC
  Administered 2019-06-01: 60 mg via ORAL
  Filled 2019-06-01: qty 1

## 2019-06-01 MED ORDER — IPRATROPIUM BROMIDE 0.02 % IN SOLN: 0.5000 mg | Freq: Four times a day (QID) | RESPIRATORY_TRACT | Status: DC | PRN

## 2019-06-01 MED ORDER — SPIRONOLACTONE 25 MG PO TABS
25.0000 mg | ORAL_TABLET | Freq: Every day | ORAL | Status: DC
  Administered 2019-06-01: 25 mg via ORAL
  Filled 2019-06-01: qty 1

## 2019-06-01 MED ORDER — FEBUXOSTAT 40 MG PO TABS
40.0000 mg | ORAL_TABLET | Freq: Every day | ORAL | Status: DC
  Administered 2019-06-01 – 2019-06-02 (×2): 40 mg via ORAL
  Filled 2019-06-01 (×2): qty 1

## 2019-06-01 MED ORDER — CLOPIDOGREL BISULFATE 75 MG PO TABS
75.0000 mg | ORAL_TABLET | Freq: Every day | ORAL | Status: DC
  Administered 2019-06-01: 75 mg via ORAL
  Filled 2019-06-01: qty 1

## 2019-06-01 MED ORDER — BISOPROLOL FUMARATE 5 MG PO TABS
5.0000 mg | ORAL_TABLET | Freq: Every day | ORAL | Status: DC
  Administered 2019-06-01 – 2019-06-02 (×2): 5 mg via ORAL
  Filled 2019-06-01 (×3): qty 1

## 2019-06-01 MED ORDER — TORSEMIDE 20 MG PO TABS
20.0000 mg | ORAL_TABLET | Freq: Every day | ORAL | Status: DC
  Administered 2019-06-01: 20 mg via ORAL
  Filled 2019-06-01: qty 1

## 2019-06-01 MED ORDER — MORPHINE SULFATE (PF) 4 MG/ML IV SOLN
4.0000 mg | Freq: Once | INTRAVENOUS | Status: AC
  Administered 2019-06-01: 02:00:00 4 mg via INTRAVENOUS
  Filled 2019-06-01: qty 1

## 2019-06-01 MED ORDER — HEPARIN SODIUM (PORCINE) 5000 UNIT/ML IJ SOLN
5000.0000 [IU] | Freq: Three times a day (TID) | INTRAMUSCULAR | Status: DC
  Administered 2019-06-01 – 2019-06-02 (×4): 5000 [IU] via SUBCUTANEOUS
  Filled 2019-06-01 (×4): qty 1

## 2019-06-01 MED ORDER — NITROGLYCERIN 0.4 MG SL SUBL
0.4000 mg | SUBLINGUAL_TABLET | SUBLINGUAL | Status: DC | PRN
  Administered 2019-06-01: 0.4 mg via SUBLINGUAL
  Filled 2019-06-01: qty 1

## 2019-06-01 MED ORDER — ISOSORBIDE MONONITRATE ER 30 MG PO TB24
30.0000 mg | ORAL_TABLET | Freq: Once | ORAL | Status: AC
  Administered 2019-06-01: 30 mg via ORAL
  Filled 2019-06-01: qty 1

## 2019-06-01 MED ORDER — TECHNETIUM TO 99M ALBUMIN AGGREGATED
1.6000 | Freq: Once | INTRAVENOUS | Status: AC | PRN
  Administered 2019-06-01: 1.6 via INTRAVENOUS

## 2019-06-01 MED ORDER — ROSUVASTATIN CALCIUM 20 MG PO TABS
20.0000 mg | ORAL_TABLET | Freq: Every day | ORAL | Status: DC
  Administered 2019-06-01: 20 mg via ORAL
  Filled 2019-06-01: qty 1

## 2019-06-01 MED ORDER — POLYETHYLENE GLYCOL 3350 17 G PO PACK: 17.0000 g | PACK | Freq: Every morning | ORAL | Status: DC

## 2019-06-01 MED ORDER — ISOSORBIDE MONONITRATE ER 30 MG PO TB24
30.0000 mg | ORAL_TABLET | Freq: Once | ORAL | Status: DC
  Filled 2019-06-01: qty 1

## 2019-06-01 MED ORDER — ONDANSETRON HCL 4 MG/2ML IJ SOLN
4.0000 mg | Freq: Once | INTRAMUSCULAR | Status: AC
  Administered 2019-06-01: 4 mg via INTRAVENOUS
  Filled 2019-06-01: qty 2

## 2019-06-01 MED ORDER — RANOLAZINE ER 500 MG PO TB12
500.0000 mg | ORAL_TABLET | Freq: Two times a day (BID) | ORAL | Status: DC
  Administered 2019-06-01: 500 mg via ORAL
  Filled 2019-06-01: qty 1

## 2019-06-01 NOTE — Progress Notes (Signed)
  Echocardiogram 2D Echocardiogram has been performed.  Keila Turan A Jelicia Nantz 06/01/2019, 11:26 AM

## 2019-06-01 NOTE — Progress Notes (Signed)
  PROGRESS NOTE  Patient admitted earlier this morning. See H&P.   [name] is a 74 y.o. female with medical history significant of morbid obesity with BMI 42.5, CAD, s/p CABG October 3832, ICM, diastolic CHF, BiV AICD, chronic hypoxic respiratory failure on 3L O2 [place], CKD stage III, T2DM, HTN, HLD, CHF TIA, OSA unable to tolerate CPAP, who presented with chest pain. Patient reports that she started to have intermittent chest pain 3 days ago.  Her chest pain was located to her midsternal chest area, sharp pain, mild to moderate pain, radiating to left arm and back.  Exertion and activities make it worse and resting makes it better.  Associated symptoms included mild worsening shortness of breath from her baseline chronic shortness of breath.  She endorses intermittent nausea and night sweats 4 weeks, and believes that the symptoms were due to her "gallbladder being removed last year".  Patient reports that her chest pain became constant with similar characteristics.  She took a total of 4 tablets NTG SL with some improvement of her chest pain.  Patient came to the ED for further evaluation.    This morning, she states that her chest pain is sharp and intermittent in nature.  At the time of my examination, she did not have any chest pain.  Complained of her head itching.  No complaints of shortness of breath.  Admitted to some intermittent nausea.  A/P:  Chest pain CAD, status post CABG -Troponin has been flat  -D-dimer elevated 1.57, VQ scan pending -Echocardiogram shows EF 50 to 55%, LV endocardial border not optimally defined to evaluate regional wall motion, diastolic function could not be evaluated -Cardiology consulted -Continue aspirin, Plavix, bisoprolol, Ranexa, Imdur, crestor.  Cardiology has adjusted Ranexa and Imdur dosing  Chronic systolic and diastolic heart failure, NICM  -Continue Demadex, spironolactone  Complete heart block -ICD in place  Chronic hypoxemic respiratory  failure -Requires 3 L nasal cannula at baseline  CKD stage IV -Baseline creatinine 1.77-2.12 -Stable  OSA -Unable to tolerate CPAP  Type 2 diabetes -Hemoglobin A1c 7.1 -SSI  Hypokalemia -Replace, trend  Gout -Continue uloric, colchicine   Morbid obesity -Estimated body mass index is 42.51 kg/m as calculated from the following:   Height as of this encounter: 5\' 3"  (1.6 m).   Weight as of this encounter: 108.9 kg.     Dessa Phi, DO [facility] 06/01/2019, 3:03 PM  Available via Epic secure chat 7am-7pm After these hours, please refer to coverage provider listed on [email or web address]

## 2019-06-01 NOTE — Progress Notes (Signed)
Patient had cp while admitted to floor.  Patient stated it was 5/10 to mid chest and radiated to left arm and back.   EKG was done, O2 increase to 4L, and gave patient nitro x1 dose.  BP decrease w/nitro dose. Patient stated that pain has decrease substantially and was feeling much better.

## 2019-06-01 NOTE — Consult Note (Addendum)
Cardiology Consultation:   Patient ID: ADELEINE PASK MRN: [id number]; DOB: 13-Dec-1945  Admit date: 05/31/2019 Date of Consult: 06/01/2019  Primary Care Provider: Pa, Cook Primary Cardiologist: Sanda Klein, MD  Primary Electrophysiologist:  None    Patient Profile:   ARALYNN BRAKE is a 74 y.o. female with a hx of CAD s/p CABG (11/2014: LIMA-LAD, SVG-D1, SVG-OM1-OM2, SVG-PDA), chronic systolic and diastolic heart failure, NICM, CHB s/p ICD, morbid obesity, adrenal insufficiency, DM2, HTN, CKD stage III, gout, OSA and asthma who is being seen today for the evaluation of chest pain at the request of Dr. Maylene Roes.  History of Present Illness:   Ms. Colvin was last seen in clinic on 03/28/19 following a hospitalization for shortness of breath. She underwent right and left heart cath 02/2019 which revealed stable disease compared to angiography in 2019, which includes occluded SVG-OM, patent stents in the native LCx and ramus. Pressures were compensated at that time, but mild pulmonary hypertension. Dyspnea was felt related to obesity hypoventilation syndrome.   Her NICM pre-dated CAD diagnosis. PPM (2001) for CHB was upgraded to ICD and her EF normalized with resynchronization therapy  (normal EF in 2017). She underwent CABG 2016 with resection of her defibrillator lead.  Programmed to VDD due to high atrial pacing thresholds since surgery. She had a CHF exacerbation with echo 01/2019 showing a reduced EF of 40-45% and increased left atrial pressure. This coincided with a loss of left ventricular capture. This was corrected, but she continued to c/o SOB and proceeded to [facility] which showed normal pressures and stable CAD.  She follows with Dr. [name] in clinic and was working on weight loss during her last appt 03/2019. She uses metolazone PRN and he suspected that she may have been using this excessively.   She presented to [facility] with symptoms of intermittent chest pain  that started on Sunday when she was sitting in church. The pain was located in her left-center chest with radiation to her left shoulder and back. Pain was described as sharp and intermittent. CP was not changed by inspiration or position changes.  The pain finally subsided and she was able to rest. The pain was mild on Monday, but returned on Tues as a 7/10.  She had SOB with the CP. Chest pain was relieved by nitro x 4 tablets prior to arrival. Cardiology was consulted and recommended D-dimer, which was elevated. She is pending VQ scan. She had another bout of chest pain this morning relieved with nitro x 1.  She also reports nausea and night sweats for the past 4 weeks that she thinks are related to recent cholecystectomy. She has IBS and fluctuates between constipation and diarrhea.    Past Medical History:  Diagnosis Date  . AICD (automatic cardioverter/defibrillator) present   . Anemia   . Arthritis   . Asthma   . CAD (coronary artery disease)    a. 10/09/16 LHC: SVG->LAD patent, SVG->Diag patent, SVG->RCA patent, SVG->LCx occluded. EF 60%, b. 10/31/16 LHC DES to AV groove Circ, DES to intermed branch  . Cellulitis and abscess of foot 12/2014   RT FOOT  . CHF (congestive heart failure) ([place])   . Chronic bronchitis ([place])   . Chronic lower back pain   . Diverticulosis   . Facial numbness 02/2016  . Fatty liver disease, nonalcoholic   . Gastritis   . GERD (gastroesophageal reflux disease)   . Gout   . H/O hiatal hernia   . High cholesterol   .  Hyperplastic colon polyp   . Hypertension   . IBS (irritable bowel syndrome)   . Internal hemorrhoids   . Ischemic cardiomyopathy   . Obesity   . On home oxygen therapy    "2L at night" (10/31/2016)  . OSA (obstructive sleep apnea)    "can't tolerate a mask" (10/30/2016)  . Pneumonia    "couple times" (10/31/2016)  . Shortness of breath   . TIA (transient ischemic attack)    "recently" (10/31/2016)  . Type II diabetes mellitus ([place])      Past Surgical History:  Procedure Laterality Date  . ABDOMINAL ULTRASOUND  12/01/2011   Peripelvic cysts- #1- 2.4x1.9x2.3cm, #2-1.2x0.9x1.2cm  . ANKLE FRACTURE SURGERY Right    "had rod put in"  . ANTERIOR CERVICAL DECOMP/DISCECTOMY FUSION    . APPENDECTOMY    . BACK SURGERY    . BIOPSY  12/29/2017   Procedure: BIOPSY;  Surgeon: [name], MD;  [facility]: [facility];  Service: Endoscopy;;  . BIV ICD INSERTION CRT-D  2001?  [name] BIV PACEMAKER GENERATOR CHANGE OUT N/A 11/02/2012   Procedure: BIV PACEMAKER GENERATOR CHANGE OUT;  Surgeon: Sanda Klein, MD;  Location: [place] CATH LAB;  Service: Cardiovascular;  Laterality: N/A;  . CARDIAC CATHETERIZATION  05/17/1999   No significant coronary obstructive disease w/ mild 20% luminal irregularity of the first diag branch of the LAD  . CARDIAC CATHETERIZATION  07/08/2002   No significant CAD, moderately depressed LV systolic function  . CARDIAC CATHETERIZATION Bilateral 04/26/2007   Normal findings, recommend medical therapy  . CARDIAC CATHETERIZATION  02/18/2008   Moderate CAD, would benefit from having a functional study, recommend continue medical therapy  . CARDIAC CATHETERIZATION  07/23/2012   Medical therapy  . CARDIAC CATHETERIZATION N/A 11/24/2014   Procedure: Left Heart Cath and Coronary Angiography;  Surgeon: Troy Sine, MD;  Location: [place] CV LAB;  Service: Cardiovascular;  Laterality: N/A;  . CARDIAC CATHETERIZATION  11/27/2014   Procedure: Intravascular Pressure Wire/FFR Study;  Surgeon: Peter M Martinique, MD;  Location: [place] CV LAB;  Service: Cardiovascular;;  . CARDIAC CATHETERIZATION  10/09/2016  . CHOLECYSTECTOMY N/A 04/09/2018   Procedure: LAPAROSCOPIC CHOLECYSTECTOMY;  Surgeon: Greer Pickerel, MD;  [facility];  Service: General;  Laterality: N/A;  . COLONOSCOPY WITH PROPOFOL N/A 12/29/2017   Procedure: COLONOSCOPY WITH PROPOFOL;  Surgeon: [name], MD;  [facility];  Service:  Endoscopy;  Laterality: N/A;  . CORONARY ANGIOGRAM  2010  . CORONARY ARTERY BYPASS GRAFT N/A 11/29/2014   Procedure: CORONARY ARTERY BYPASS GRAFTING x 5 (LIMA-LAD, SVG-D, SVG-OM1-OM2, SVG-PD);  Surgeon: Melrose Nakayama, MD;  Location: [place];  Service: Open Heart Surgery;  Laterality: N/A;  . CORONARY STENT INTERVENTION N/A 10/31/2016   Procedure: CORONARY STENT INTERVENTION;  Surgeon: Burnell Blanks, MD;  Location: [place] CV LAB;  Service: Cardiovascular;  Laterality: N/A;  . ESOPHAGOGASTRODUODENOSCOPY (EGD) WITH PROPOFOL N/A 12/29/2017   Procedure: ESOPHAGOGASTRODUODENOSCOPY (EGD) WITH PROPOFOL;  Surgeon: [name], MD;  [facility];  Service: Endoscopy;  Laterality: N/A;  . FRACTURE SURGERY    . INSERT / REPLACE / [place]  . KNEE ARTHROSCOPY Bilateral   . LEFT HEART CATH AND CORS/GRAFTS ANGIOGRAPHY N/A 12/09/2017   Procedure: LEFT HEART CATH AND CORS/GRAFTS ANGIOGRAPHY;  Surgeon: Troy Sine, MD;  Location: [place] CV LAB;  Service: Cardiovascular;  Laterality: N/A;  . LEFT HEART CATHETERIZATION WITH CORONARY ANGIOGRAM N/A 07/23/2012   Procedure: LEFT HEART CATHETERIZATION WITH  CORONARY ANGIOGRAM;  Surgeon: Leonie Man, MD;  Location: [facility] C[facility] LAB;  Service: Cardiovascular;  Laterality: N/A;  Carlton Adam MYOVIEW  11/14/2011   Mild-moderate defect seen in Mid Inferolateral and Mid Anterolateral regions-consistant w/ infarct/scar. No significant ischemia demonstrated.  [name] POLYPECTOMY  12/29/2017   Procedure: POLYPECTOMY;  Surgeon: [name], MD;  [facility];  Service: Endoscopy;;  . RIGHT/LEFT HEART CATH AND CORONARY ANGIOGRAPHY N/A 10/09/2016   Procedure: RIGHT/LEFT HEART CATH AND CORONARY ANGIOGRAPHY;  Surgeon: Jolaine Artist, MD;  Location: [place] CV LAB;  Service: Cardiovascular;  Laterality: N/A;  . RIGHT/LEFT HEART CATH AND CORONARY/GRAFT ANGIOGRAPHY N/A 03/11/2019   Procedure: RIGHT/LEFT HEART CATH  AND CORONARY/GRAFT ANGIOGRAPHY;  Surgeon: Jolaine Artist, MD;  Location: [place] CV LAB;  Service: Cardiovascular;  Laterality: N/A;  . TEE WITHOUT CARDIOVERSION N/A 11/29/2014   Procedure: TRANSESOPHAGEAL ECHOCARDIOGRAM (TEE);  Surgeon: Melrose Nakayama, MD;  Location: [place];  Service: Open Heart Surgery;  Laterality: N/A;  . TRANSTHORACIC ECHOCARDIOGRAM  07/23/2012   EF 55-60%, normal-mild  . TUBAL LIGATION       Home Medications:  Prior to Admission medications   Medication Sig Start Date End Date Taking? Authorizing Provider  acetaminophen (TYLENOL) 500 MG tablet Take 500-1,000 mg by mouth every 12 (twelve) hours as needed (for pain or headaches).    Yes [provider]  albuterol (PROVENTIL HFA;VENTOLIN HFA) 108 (90 Base) MCG/ACT inhaler Inhale 2 puffs into the lungs every 6 (six) hours as needed for wheezing or shortness of breath. 05/02/18  Yes Fuller Plan A, MD  albuterol (PROVENTIL) (2.5 MG/3ML) 0.083% nebulizer solution Take 3 mLs (2.5 mg total) by nebulization every 6 (six) hours as needed for wheezing or shortness of breath. 05/02/18  Yes Fuller Plan A, MD  aspirin EC 81 MG tablet Take 1 tablet (81 mg total) by mouth daily. 03/27/16  Yes Erlene Quan, PA-C  Biotin 1000 MCG tablet Take 1,000 mcg by mouth daily.   Yes [provider]  bisoprolol (ZEBETA) 5 MG tablet TAKE 1 TABLET EVERY DAY Patient taking differently: Take 5 mg by mouth daily.  04/07/19  Yes Kilroy, Luke K, PA-C  budesonide-formoterol (SYMBICORT) 160-4.5 MCG/ACT inhaler Inhale 2 puffs into the lungs 2 (two) times daily.   Yes [provider]  cetirizine (ZYRTEC) 10 MG tablet Take 10 mg by mouth daily.  03/24/17  Yes [provider]  clopidogrel (PLAVIX) 75 MG tablet TAKE 1 TABLET EVERY DAY Patient taking differently: Take 75 mg by mouth at bedtime.  03/23/19  Yes Croitoru, [name], MD  clotrimazole (MYCELEX) 10 MG troche Take 10 mg by mouth daily as needed (Sore  Mouth/thrush).    Yes [provider]  colchicine 0.6 MG tablet Take 0.6 mg by mouth 2 (two) times daily as needed (for gout flares).   Yes [provider]  cycloSPORINE (RESTASIS) 0.05 % ophthalmic emulsion Place 1 drop into both eyes 2 (two) times daily.   Yes [provider]  diclofenac sodium (VOLTAREN) 1 % GEL Apply 2 g topically 4 (four) times daily. Patient taking differently: Apply 2 g topically 4 (four) times daily as needed (pain.).  05/02/18  Yes Smith, Rondell A, MD  EPINEPHrine (EPIPEN 2-PAK) 0.3 mg/0.3 mL IJ SOAJ injection Inject 0.3 mg into the muscle once as needed (severe allergic reaction).   Yes [provider]  febuxostat (ULORIC) 40 MG tablet Take 40 mg by mouth daily. 11/10/17  Yes [provider]  guaiFENesin (MUCINEX) 600 MG 12 hr tablet Take 600 mg by mouth at bedtime as needed for cough or to loosen phlegm.    Yes [provider]  hydrocortisone 2.5 % cream Apply 1 application topically daily as needed (itching).  01/08/18  Yes [provider]  hydroxypropyl methylcellulose / hypromellose (ISOPTO TEARS / GONIOVISC) 2.5 % ophthalmic solution Place 1 drop into both eyes 3 (three) times daily.    Yes [provider]  ipratropium (ATROVENT) 0.02 % nebulizer solution Take 2.5 mLs (0.5 mg total) by nebulization 4 (four) times daily. Patient taking differently: Take 0.5 mg by nebulization every 6 (six) hours as needed for wheezing or shortness of breath.  05/02/18  Yes Fuller Plan A, MD  isosorbide mononitrate (IMDUR) 60 MG 24 hr tablet Take 60 mg by mouth daily. 03/02/19  Yes [provider]  metolazone (ZAROXOLYN) 2.5 MG tablet Take 1 tablet (2.5 mg total) by mouth daily as needed (for weight gain greater than 3 pounds). Take one tablet on Monday and one tablet on Thursday. Patient taking differently: Take 2.5 mg by mouth daily as needed (for an overnight weight gain of greater than 3 pounds).  01/27/19   Yes Almyra Deforest, PA  nitroGLYCERIN (NITROSTAT) 0.4 MG SL tablet Place 1 tablet (0.4 mg total) under the tongue every 5 (five) minutes as needed for chest pain. 11/13/16 03/03/20 Yes Kilroy, Luke K, PA-C  ondansetron (ZOFRAN ODT) 4 MG disintegrating tablet Take 1 tablet (4 mg total) by mouth every 8 (eight) hours as needed for nausea or vomiting. Patient taking differently: Take 4 mg by mouth every 8 (eight) hours as needed for nausea or vomiting (DISSOLVE IN THE MOUTH).  09/17/18  Yes Nandigam, Venia Minks, MD  OXYGEN Inhale 3 L/min into the lungs continuous.    Yes [provider]  polyethylene glycol (MIRALAX / GLYCOLAX) 17 g packet Take 17 g by mouth in the morning.    Yes [provider]  potassium chloride SA (KLOR-CON) 20 MEQ tablet Take 1 tablet (20 mEq total) by mouth 2 (two) times daily. Patient taking differently: Take 20 mEq by mouth daily.  01/25/19  Yes Croitoru, [name], MD  predniSONE (DELTASONE) 10 MG tablet Take 10 mg by mouth daily as needed (as directed for gout flares).  02/15/19  Yes [provider]  ranolazine (RANEXA) 500 MG 12 hr tablet Take 1 tablet (500 mg total) by mouth 2 (two) times daily. 04/21/19  Yes Croitoru, [name], MD  rosuvastatin (CRESTOR) 20 MG tablet TAKE 1 TABLET AT BEDTIME Patient taking differently: Take 20 mg by mouth at bedtime.  04/07/19  Yes Croitoru, [name], MD  saxagliptin HCl (ONGLYZA) 2.5 MG TABS tablet Take 2.5 mg by mouth daily.  12/08/17  Yes Almyra Deforest, PA  spironolactone (ALDACTONE) 25 MG tablet Take 25 mg by mouth at bedtime.  03/31/19  Yes [provider]  torsemide (DEMADEX) 20 MG tablet Take 3 tablets (60 mg total) by mouth daily. Patient taking differently: Take 20-40 mg by mouth See admin instructions. Take 40 mg by mouth in the morning and 20 mg in the afternoon 03/28/19  Yes Croitoru, [name], MD  triamcinolone cream (KENALOG) 0.1 % Apply 1 application topically 2 (two) times daily as needed (itching).  08/24/14  Yes  [provider]  clotrimazole (LOTRIMIN) 1 % cream Apply 1 application topically 2 (two) times daily as needed (rash). Patient not taking: Reported on 05/31/2019 05/02/18   Norval Morton, MD    Inpatient  Medications: Scheduled Meds: . aspirin EC  81 mg Oral Daily  . bisoprolol  5 mg Oral Daily  . clopidogrel  75 mg Oral QHS  . febuxostat  40 mg Oral Daily  . heparin  5,000 Units Subcutaneous Q8H  . isosorbide mononitrate  60 mg Oral Daily  . polyethylene glycol  17 g Oral q AM  . ranolazine  500 mg Oral BID  . rosuvastatin  20 mg Oral QHS  . sodium chloride flush  3 mL Intravenous Once  . spironolactone  25 mg Oral QHS  . torsemide  20 mg Oral q1600  . torsemide  40 mg Oral Daily   Continuous Infusions:  PRN Meds: acetaminophen **OR** acetaminophen, albuterol, colchicine, ipratropium, nitroGLYCERIN, ondansetron **OR** ondansetron (ZOFRAN) IV  Allergies:    Allergies  Allergen Reactions  . Ivp Dye [Iodinated Diagnostic Agents] Shortness Of Breath    No reaction to PO contrast with non-ionic dye.06-25-2014/rsm  . Shellfish Allergy Anaphylaxis  . Shellfish-Derived Products Anaphylaxis  . Sulfa Antibiotics Shortness Of Breath  . Iodine Hives  . Atorvastatin Other (See Comments)    Pt states "causes bilateral leg pain/cramps."  . Benadryl [Diphenhydramine] Other (See Comments)    "THIS DRIVES ME CRAZY AND MAKES ME FEEL LIKE I AM DYING"  . Doxycycline Other (See Comments)    Unknown reaction, per patient  . Cephalexin Itching and Rash  . Zithromax [Azithromycin] Rash    Social History:   Social History   Socioeconomic History  . Marital status: Widowed    Spouse name: Not on file  . Number of children: 5  . Years of education: Not on file  . Highest education level: Not on file  Occupational History  . Occupation: STAFF/BUFFET    Employer:  [employer]  Tobacco Use  . Smoking status: Former Smoker    Packs/day: 0.25    Years: 3.00    Pack  years: 0.75    Types: Cigarettes    Quit date: 02/11/1968    Years since quitting: 51.3  . Smokeless tobacco: Never Used  Substance and Sexual Activity  . Alcohol use: No  . Drug use: No  . Sexual activity: Never  Other Topics Concern  . Not on file  Social History Narrative   Widowed last year. She lives with her two sons.   Social Determinants of Health   Financial Resource Strain:   . Difficulty of Paying Living Expenses:   Food Insecurity:   . Worried About Charity fundraiser in the Last Year:   . Arboriculturist in the Last Year:   Transportation Needs:   . Film/video editor (Medical):   [name] Lack of Transportation (Non-Medical):   Physical Activity:   . Days of Exercise per Week:   . Minutes of Exercise per Session:   Stress:   . Feeling of Stress :   Social Connections:   . Frequency of Communication with Friends and Family:   . Frequency of Social Gatherings with Friends and Family:   . Attends Religious Services:   . Active Member of Clubs or Organizations:   . Attends Archivist Meetings:   [name] Marital Status:   Intimate Partner Violence:   . Fear of Current or Ex-Partner:   . Emotionally Abused:   [name] Physically Abused:   . Sexually Abused:     Family History:    Family History  Problem Relation Age of Onset  . Breast cancer Mother   .  Diabetes Mother   . Heart disease Maternal Grandmother   . Kidney disease Maternal Grandmother   . Diabetes Maternal Grandmother   . Glaucoma Maternal Aunt   . Heart disease Maternal Aunt   . Asthma Sister   . Colon cancer Neg Hx   . Stomach cancer Neg Hx   . Pancreatic cancer Neg Hx      ROS:  Please see the history of present illness.   All other ROS reviewed and negative.     Physical Exam/Data:   Vitals:   06/01/19 0536 06/01/19 0542 06/01/19 0753 06/01/19 1123  BP: 91/61 107/63 (!) 129/51 (!) 109/32  Pulse: 66 66 65 65  Resp: 11 16    Temp:   97.8 F (36.6 C) 98.5 F (36.9 C)  TempSrc:    Oral Oral  SpO2: 100% 97% 100% 95%  Weight:      Height:        Intake/Output Summary (Last 24 hours) at 06/01/2019 1227 Last data filed at 06/01/2019 0500 Gross per 24 hour  Intake --  Output 300 ml  Net -300 ml   Last 3 Weights 05/31/2019 03/28/2019 03/11/2019  Weight (lbs) 240 lb 253 lb 257 lb 15 oz  Weight (kg) 108.863 kg 114.76 kg 117 kg     Body mass index is 42.51 kg/m.  General:  Obese female NAD HEENT: normal Lymph: no adenopathy Neck: no JVD, exam difficult given body habitus Vascular: No carotid bruits Cardiac:  normal S1, S2; RRR; no murmur  Lungs:  clear to auscultation bilaterally, no wheezing, rhonchi or rales  Abd: soft, nontender, no hepatomegaly  Ext: 1+ B LE edema Musculoskeletal:  No deformities, BUE and BLE strength normal and equal Skin: warm and dry  Neuro:  CNs 2-12 intact, no focal abnormalities noted Psych:  Normal affect   EKG:  The EKG was personally reviewed and demonstrates:  Paced rhythm - A-sensed, V-paced, HR 66 Telemetry:  Telemetry was personally reviewed and demonstrates:  V-paced  Relevant CV Studies:  Echo - pending  Right and left heart cath 03/11/19:  Colon [place] 1st Diag lesion is 60% stenosed.  Ost 2nd Diag lesion is 50% stenosed.  Mid LAD lesion is 100% stenosed.  Non-stenotic Ost Cx to Mid Cx lesion was previously treated.  Prox RCA lesion is 100% stenosed.  Non-stenotic Ost Ramus to Ramus lesion was previously treated.  Origin to Prox Graft lesion before Ramus is 100% stenosed.   Findings:  Ao = 136/68 (95)  LV =  155/11 RA =  7 RV = 34/9 PA = 34/17 (24) PCW = 14 Fick cardiac output/index = 6.8/3.1 PVR = 1.5 WU FA sat = 96% PA sat = 65%, 75%, 69%  Assessment: 1. Stable coronary anatomy with no change from previous. SVG to OM system chronically occluded with previous PCI of native LCX system. Remainder of grafts are widely patent 2. Very mild PAH with normal left-sided pressures. 3. Very difficult procedure given  patient's size and fidgeting on cath table  Plan/Discussion:  Medical therapy.   Laboratory Data:  High Sensitivity Troponin:   Recent Labs  Lab 05/31/19 1315 05/31/19 1953 06/01/19 0125 06/01/19 0633  TROPONINIHS 22* 20* 19* 19*     Chemistry Recent Labs  Lab 05/31/19 1315 06/01/19 0125  NA 137 139  K 3.3* 3.4*  CL 92* 95*  CO2 34* 32  GLUCOSE 147* 142*  BUN 60* 53*  CREATININE 1.97* 1.85*  CALCIUM 9.4 9.3  GFRNONAA 24* 26*  GFRAA  28* 30*  ANIONGAP 11 12    Recent Labs  Lab 06/01/19 0125  PROT 6.5  ALBUMIN 3.3*  AST 21  ALT 18  ALKPHOS 74  BILITOT 0.6   Hematology Recent Labs  Lab 05/31/19 1315  WBC 7.5  RBC 4.22  HGB 11.7*  HCT 38.8  MCV 91.9  MCH 27.7  MCHC 30.2  RDW 14.2  PLT 182   BNPNo results for input(s): BNP, PROBNP in the last 168 hours.  DDimer  Recent Labs  Lab 06/01/19 0128  DDIMER 1.57*     Radiology/Studies:  DG Chest 2 View  Result Date: 05/31/2019 CLINICAL DATA:  Chest pain EXAM: CHEST - 2 VIEW COMPARISON:  03/14/2019 FINDINGS: There is no focal consolidation. There is no pleural effusion or pneumothorax. The heart and mediastinal contours are unremarkable. There is evidence of prior CABG. There is a cardiac pacemaker present. There is no acute osseous abnormality. IMPRESSION: No active cardiopulmonary disease. Electronically Signed   By: Kathreen Devoid   On: 05/31/2019 13:54   DG Shoulder Left  Result Date: 06/01/2019 CLINICAL DATA:  Left shoulder pain EXAM: LEFT SHOULDER - 2+ VIEW COMPARISON:  None. FINDINGS: Moderate degenerative changes in the left AC and glenohumeral joints. A shin no visible fracture. No subluxation or dislocation. IMPRESSION: Moderate degenerative changes.  No acute bony abnormality. Electronically Signed   By: Rolm Baptise M.D.   On: 06/01/2019 01:55       HEAR Score (for undifferentiated chest pain):  HEAR Score: 6    Assessment and Plan:   Chest pain relieved with nitro CAD s/p CABG  (11/2014) - hs troponin 22 --> 20 --> 19 --> 19 - EKG with paced rhythm - D-dimer elevated 1.57 - VQ scan pending - given that she had a recent heart catheterization with stable disease, would favor anti-anginal titration and possible nuclear stress test (would need to wait 24 hr after VQ scan) - maintained on ASA, plavix, BB, ranexa, 500 mg BID, 60 mg imdur, spironolactone, and demadex - increase ranexa to 1000 mg BID - if pressure tolerates, increase imdur to 90 mg daily   Chronic systolic and diastolic heart failure CHB ICD in place - CHF responded well to resynchronization therapy in 2008 - EF as low as 20%, normal in 2017 - echo pending - device interrogated in the ER - will contact [place] rep to review   CKD stage III - sCr 1.85 - baseline appears to be    DM2 - A1c 7.1%   OSA intolerant to CPAP   Hyperlipidemia 01/21/2019: Cholesterol, Total 186; HDL 44; LDL Chol Calc (NIH) 99; Triglycerides 254 - on high dose statin      For questions or updates, please contact [facility] Please consult [email or web address] for contact info under    Signed, Ledora Bottcher, [place]  06/01/2019 12:27 PM   Patient seen and examined.  Agree with above documentation.  Ms. Hagner is a 74 year old female with a history of CAD status post CABG (LIMA-LAD, SVG-D1, SVG-OM1-OM 2, SVG-PDA) in 2016, chronic combined systolic and diastolic heart failure, complete heart block status post BiV ICD, morbid obesity, type 2 diabetes, adrenal insufficiency, CKD stage III, OSA, asthma who is being seen today for the evaluation of chest pain at the request of Dr. Maylene Roes.  Patient's cardiac history includes being diagnosed with complete heart block in 2001 and having pacemaker placed at that time.  Subsequently was found to have a nonischemic cardiomyopathy with EF as low as  20%.  Pacemaker was upgraded to BiV ICD, and her EF normalized with resynchronization.  She was found in 2016 to have coronary  artery disease and underwent CABG at that time.  She recently was found in December 2020 to be in decompensated heart failure.  Echo showed reduced EF of 40 to 45%.  This coincided with loss of left ventricular capture.  She continued to have some shortness of breath once this was corrected, prompting a left and right heart catheterizations in January 2021.  That showed normal pressures and stable CAD, though was notable for occluded SVG to OM1/2.  Remaining grafts were patent.  She subsequently was doing well until developed onset of chest pain that started on 05/29/2019.  Had been intermittent pain, but starting on 4/20 began having constant pain.  Describes as sharp pain in center of her chest.  Radiates to left arm.  Reports some improvement with nitroglycerin.  States the pain persisted all day yesterday, prompting her to come to the ED.  EKG personally reviewed, shows BiV paced rhythm.  High-sensitivity troponins 22 > 20 > 19 > 19.  Labs also notable for creatinine 1.85 (appears to be baseline), D-dimer 1.57.  Patient's device was interrogated, showed no events.  She is 100% BiV paced.  On exam, patient is alert and oriented, regular rate and rhythm, 2/6 systolic murmur, lungs CTAB, no LE edema, no JVD appreciated but difficult to assess given body habitus.  Given continuous chest pain with unremarkable high-sensitivity troponins, do not suspect an acute coronary syndrome.  Will follow up echocardiogram.  D-dimer elevated, agree with obtaining a VQ scan.  Pain could represent microvascular angina given improvement with nitro, would recommend titrating antianginal regimen.  Will increase Imdur to 90 mg daily and Ranexa to 1000 mg twice daily.  Donato Heinz, MD

## 2019-06-01 NOTE — H&P (Signed)
History and Physical    [name] XBM:[id number] DOB: 01-16-1946 DOA: 05/31/2019  PCP: Pa, [place] Patient coming from: home  Chief Complaint:  Chest pain  HPI: [name] is a 74 y.o. female with medical history significant of morbid obesity with BMI 42.5, CAD, as/P CABG October 0272, ICM, diastolic CHF, BiV AICD, chronic hypoxic respiratory failure on 3 O2 [place], CKD stage III, T2DM, HTN, HLD, CHF TIA, OSA unable to tolerate CPAP, who presented with chest pain.  Patient reports that she started to have intermittent chest pain 3 days ago.  Her chest pain was located to her midsternal chest area, sharp pain, mild to moderate pain, radiating to left arm and back.  Exertion and activities make it worse and resting makes it better.  Associated symptoms included mild worsening shortness of breath from her baseline chronic shortness of breath.  She endorses intermittent nausea and night sweats 4 weeks, and believes that the symptoms were due to her "gallbladder being removed last year".  Patient reports that her chest pain became constant today with similar characteristics.  She took a total of 4 tablets NTG SL with some improvement of her chest pain.  Patient came to the ED for further evaluation.  In the emergency room, she was afebrile with pulse 72, RR 20, BP 119/49 and O2 sats 100% on home O2 3 LNC.  Labs showed potassium 3.3, BUN 60, creatinine 1.97, CO2 34, nonrevealing CBC, troponin +22 and 20.  EKG showed paced rhythm. CXR showed no acute changes.  D-dimer is pending.  ED provider has talked to cardiology moonlighter tonight.  Patient received aspirin 324 mg p.o. x 1, morphine 4 mg IV x1 at the ED.   Review of Systems: As per HPI otherwise 10 point review of systems negative.  Review of Systems Otherwise negative except as per HPI, including: General: Denies fever, chills, night sweats or unintended weight loss. Resp: Denies cough, wheezing.  Positive for mild  shortness of breath. Cardiac: Denies palpitations, orthopnea, paroxysmal nocturnal dyspnea.  Positive for chest pain.  GI: Denies abdominal pain,vomiting, diarrhea or constipation.  Positive for chronic intermittent nausea and night sweats GU: Denies dysuria, frequency, hesitancy or incontinence MS: Denies muscle aches, joint pain or swelling Neuro: Denies headache, neurologic deficits (focal weakness, numbness, tingling), abnormal gait Psych: Denies anxiety, depression, SI/HI/AVH Skin: Denies new rashes or lesions ID: Denies sick contacts, exotic exposures, travel  Past Medical History:  Diagnosis Date  . AICD (automatic cardioverter/defibrillator) present   . Anemia   . Arthritis   . Asthma   . CAD (coronary artery disease)    a. 10/09/16 LHC: SVG->LAD patent, SVG->Diag patent, SVG->RCA patent, SVG->LCx occluded. EF 60%, b. 10/31/16 LHC DES to AV groove Circ, DES to intermed branch  . Cellulitis and abscess of foot 12/2014   RT FOOT  . CHF (congestive heart failure) ([place])   . Chronic bronchitis ([place])   . Chronic lower back pain   . Diverticulosis   . Facial numbness 02/2016  . Fatty liver disease, nonalcoholic   . Gastritis   . GERD (gastroesophageal reflux disease)   . Gout   . H/O hiatal hernia   . High cholesterol   . Hyperplastic colon polyp   . Hypertension   . IBS (irritable bowel syndrome)   . Internal hemorrhoids   . Ischemic cardiomyopathy   . Obesity   . On home oxygen therapy    "2L at night" (10/31/2016)  . OSA (obstructive  sleep apnea)    "can't tolerate a mask" (10/30/2016)  . Pneumonia    "couple times" (10/31/2016)  . Shortness of breath   . TIA (transient ischemic attack)    "recently" (10/31/2016)  . Type II diabetes mellitus ([place])     Past Surgical History:  Procedure Laterality Date  . ABDOMINAL ULTRASOUND  12/01/2011   Peripelvic cysts- #1- 2.4x1.9x2.3cm, #2-1.2x0.9x1.2cm  . ANKLE FRACTURE SURGERY Right    "had rod put in"  . ANTERIOR CERVICAL  DECOMP/DISCECTOMY FUSION    . APPENDECTOMY    . BACK SURGERY    . BIOPSY  12/29/2017   Procedure: BIOPSY;  Surgeon: [name], MD;  Location: [facility];  Service: Endoscopy;;  . BIV ICD INSERTION CRT-D  2001?  [name] BIV PACEMAKER GENERATOR CHANGE OUT N/A 11/02/2012   Procedure: BIV PACEMAKER GENERATOR CHANGE OUT;  Surgeon: Sanda Klein, MD;  Location: [facility];  Service: Cardiovascular;  Laterality: N/A;  . CARDIAC CATHETERIZATION  05/17/1999   No significant coronary obstructive disease w/ mild 20% luminal irregularity of the first diag branch of the LAD  . CARDIAC CATHETERIZATION  07/08/2002   No significant CAD, moderately depressed LV systolic function  . CARDIAC CATHETERIZATION Bilateral 04/26/2007   Normal findings, recommend medical therapy  . CARDIAC CATHETERIZATION  02/18/2008   Moderate CAD, would benefit from having a functional study, recommend continue medical therapy  . CARDIAC CATHETERIZATION  07/23/2012   Medical therapy  . CARDIAC CATHETERIZATION N/A 11/24/2014   Procedure: Left Heart Cath and Coronary Angiography;  Surgeon: Troy Sine, MD;  Location: [facility];  Service: Cardiovascular;  Laterality: N/A;  . CARDIAC CATHETERIZATION  11/27/2014   Procedure: Intravascular Pressure Wire/FFR Study;  Surgeon: Peter M Martinique, MD;  Location: [facility];  Service: Cardiovascular;;  . CARDIAC CATHETERIZATION  10/09/2016  . CHOLECYSTECTOMY N/A 04/09/2018   Procedure: LAPAROSCOPIC CHOLECYSTECTOMY;  Surgeon: Greer Pickerel, MD;  Location: [facility];  Service: General;  Laterality: N/A;  . COLONOSCOPY WITH PROPOFOL N/A 12/29/2017   Procedure: COLONOSCOPY WITH PROPOFOL;  Surgeon: [name], MD;  [facility];  Service: Endoscopy;  Laterality: N/A;  . CORONARY ANGIOGRAM  2010  . CORONARY ARTERY BYPASS GRAFT N/A 11/29/2014   Procedure: CORONARY ARTERY BYPASS GRAFTING x 5 (LIMA-LAD, SVG-D, SVG-OM1-OM2, SVG-PD);  Surgeon: Melrose Nakayama, MD;   Location: [facility];  Service: Open Heart Surgery;  Laterality: N/A;  . CORONARY STENT INTERVENTION N/A 10/31/2016   Procedure: CORONARY STENT INTERVENTION;  Surgeon: Burnell Blanks, MD;  Location: [facility];  Service: Cardiovascular;  Laterality: N/A;  . ESOPHAGOGASTRODUODENOSCOPY (EGD) WITH PROPOFOL N/A 12/29/2017   Procedure: ESOPHAGOGASTRODUODENOSCOPY (EGD) WITH PROPOFOL;  Surgeon: [name], MD;  [facility];  Service: Endoscopy;  Laterality: N/A;  . FRACTURE SURGERY    . INSERT / REPLACE / [place]  . KNEE ARTHROSCOPY Bilateral   . LEFT HEART CATH AND CORS/GRAFTS ANGIOGRAPHY N/A 12/09/2017   Procedure: LEFT HEART CATH AND CORS/GRAFTS ANGIOGRAPHY;  Surgeon: Troy Sine, MD;  Location: [facility];  Service: Cardiovascular;  Laterality: N/A;  . LEFT HEART CATHETERIZATION WITH CORONARY ANGIOGRAM N/A 07/23/2012   Procedure: LEFT HEART CATHETERIZATION WITH CORONARY ANGIOGRAM;  Surgeon: Leonie Man, MD;  Location: [facility];  Service: Cardiovascular;  Laterality: N/A;  Carlton Adam MYOVIEW  11/14/2011   Mild-moderate defect seen in Mid Inferolateral and Mid Anterolateral regions-consistant w/ infarct/scar. No significant ischemia demonstrated.  [name] POLYPECTOMY  12/29/2017   Procedure: POLYPECTOMY;  Surgeon: [name], MD;  [facility];  Service: Endoscopy;;  . RIGHT/LEFT HEART CATH AND CORONARY ANGIOGRAPHY N/A 10/09/2016   Procedure: RIGHT/LEFT HEART CATH AND CORONARY ANGIOGRAPHY;  Surgeon: Jolaine Artist, MD;  Location: [facility];  Service: Cardiovascular;  Laterality: N/A;  . RIGHT/LEFT HEART CATH AND CORONARY/GRAFT ANGIOGRAPHY N/A 03/11/2019   Procedure: RIGHT/LEFT HEART CATH AND CORONARY/GRAFT ANGIOGRAPHY;  Surgeon: Jolaine Artist, MD;  Location: [facility];  Service: Cardiovascular;  Laterality: N/A;  . TEE WITHOUT CARDIOVERSION N/A 11/29/2014   Procedure: TRANSESOPHAGEAL ECHOCARDIOGRAM  (TEE);  Surgeon: Melrose Nakayama, MD;  Location: [facility];  Service: Open Heart Surgery;  Laterality: N/A;  . TRANSTHORACIC ECHOCARDIOGRAM  07/23/2012   EF 55-60%, normal-mild  . TUBAL LIGATION      SOCIAL HISTORY:  reports that she quit smoking about 51 years ago. Her smoking use included cigarettes. She has a 0.75 pack-year smoking history. She has never used smokeless tobacco. She reports that she does not drink alcohol or use drugs.  Allergies  Allergen Reactions  . Ivp Dye [Iodinated Diagnostic Agents] Shortness Of Breath    No reaction to PO contrast with non-ionic dye.06-25-2014/rsm  . Shellfish Allergy Anaphylaxis  . Shellfish-Derived Products Anaphylaxis  . Sulfa Antibiotics Shortness Of Breath  . Iodine Hives  . Atorvastatin Other (See Comments)    Pt states "causes bilateral leg pain/cramps."  . Benadryl [Diphenhydramine] Other (See Comments)    "THIS DRIVES ME CRAZY AND MAKES ME FEEL LIKE I AM DYING"  . Doxycycline Other (See Comments)    Unknown reaction, per patient  . Cephalexin Itching and Rash  . Zithromax [Azithromycin] Rash    FAMILY HISTORY: Family History  Problem Relation Age of Onset  . Breast cancer Mother   . Diabetes Mother   . Heart disease Maternal Grandmother   . Kidney disease Maternal Grandmother   . Diabetes Maternal Grandmother   . Glaucoma Maternal Aunt   . Heart disease Maternal Aunt   . Asthma Sister   . Colon cancer Neg Hx   . Stomach cancer Neg Hx   . Pancreatic cancer Neg Hx      Prior to Admission medications   Medication Sig Start Date End Date Taking? Authorizing Provider  acetaminophen (TYLENOL) 500 MG tablet Take 500-1,000 mg by mouth every 12 (twelve) hours as needed (for pain or headaches).    Yes [provider]  albuterol (PROVENTIL HFA;VENTOLIN HFA) 108 (90 Base) MCG/ACT inhaler Inhale 2 puffs into the lungs every 6 (six) hours as needed for wheezing or shortness of breath. 05/02/18  Yes Fuller Plan A, MD    albuterol (PROVENTIL) (2.5 MG/3ML) 0.083% nebulizer solution Take 3 mLs (2.5 mg total) by nebulization every 6 (six) hours as needed for wheezing or shortness of breath. 05/02/18  Yes Fuller Plan A, MD  aspirin EC 81 MG tablet Take 1 tablet (81 mg total) by mouth daily. 03/27/16  Yes Erlene Quan, PA-C  Biotin 1000 MCG tablet Take 1,000 mcg by mouth daily.   Yes [provider]  bisoprolol (ZEBETA) 5 MG tablet TAKE 1 TABLET EVERY DAY Patient taking differently: Take 5 mg by mouth daily.  04/07/19  Yes Kilroy, Luke K, PA-C  budesonide-formoterol (SYMBICORT) 160-4.5 MCG/ACT inhaler Inhale 2 puffs into the lungs 2 (two) times daily.   Yes [provider]  cetirizine (ZYRTEC) 10 MG tablet Take 10 mg by mouth daily.  03/24/17  Yes  [provider]  clopidogrel (PLAVIX) 75 MG tablet TAKE 1 TABLET EVERY DAY Patient taking differently: Take 75 mg by mouth at bedtime.  03/23/19  Yes Croitoru, [name], MD  clotrimazole (MYCELEX) 10 MG troche Take 10 mg by mouth daily as needed (Sore Mouth/thrush).    Yes [provider]  colchicine 0.6 MG tablet Take 0.6 mg by mouth 2 (two) times daily as needed (for gout flares).   Yes [provider]  cycloSPORINE (RESTASIS) 0.05 % ophthalmic emulsion Place 1 drop into both eyes 2 (two) times daily.   Yes [provider]  diclofenac sodium (VOLTAREN) 1 % GEL Apply 2 g topically 4 (four) times daily. Patient taking differently: Apply 2 g topically 4 (four) times daily as needed (pain.).  05/02/18  Yes Smith, Rondell A, MD  EPINEPHrine (EPIPEN 2-PAK) 0.3 mg/0.3 mL IJ SOAJ injection Inject 0.3 mg into the muscle once as needed (severe allergic reaction).   Yes [provider]  febuxostat (ULORIC) 40 MG tablet Take 40 mg by mouth daily. 11/10/17  Yes [provider]  guaiFENesin (MUCINEX) 600 MG 12 hr tablet Take 600 mg by mouth at bedtime as needed for cough or to loosen phlegm.    Yes [provider]   hydrocortisone 2.5 % cream Apply 1 application topically daily as needed (itching).  01/08/18  Yes [provider]  hydroxypropyl methylcellulose / hypromellose (ISOPTO TEARS / GONIOVISC) 2.5 % ophthalmic solution Place 1 drop into both eyes 3 (three) times daily.    Yes [provider]  ipratropium (ATROVENT) 0.02 % nebulizer solution Take 2.5 mLs (0.5 mg total) by nebulization 4 (four) times daily. Patient taking differently: Take 0.5 mg by nebulization every 6 (six) hours as needed for wheezing or shortness of breath.  05/02/18  Yes Fuller Plan A, MD  isosorbide mononitrate (IMDUR) 60 MG 24 hr tablet Take 60 mg by mouth daily. 03/02/19  Yes [provider]  metolazone (ZAROXOLYN) 2.5 MG tablet Take 1 tablet (2.5 mg total) by mouth daily as needed (for weight gain greater than 3 pounds). Take one tablet on Monday and one tablet on Thursday. Patient taking differently: Take 2.5 mg by mouth daily as needed (for an overnight weight gain of greater than 3 pounds).  01/27/19  Yes Almyra Deforest, PA  nitroGLYCERIN (NITROSTAT) 0.4 MG SL tablet Place 1 tablet (0.4 mg total) under the tongue every 5 (five) minutes as needed for chest pain. 11/13/16 03/03/20 Yes Kilroy, Luke K, PA-C  ondansetron (ZOFRAN ODT) 4 MG disintegrating tablet Take 1 tablet (4 mg total) by mouth every 8 (eight) hours as needed for nausea or vomiting. Patient taking differently: Take 4 mg by mouth every 8 (eight) hours as needed for nausea or vomiting (DISSOLVE IN THE MOUTH).  09/17/18  Yes Nandigam, Venia Minks, MD  OXYGEN Inhale 3 L/min into the lungs continuous.    Yes [provider]  polyethylene glycol (MIRALAX / GLYCOLAX) 17 g packet Take 17 g by mouth in the morning.    Yes [provider]  potassium chloride SA (KLOR-CON) 20 MEQ tablet Take 1 tablet (20 mEq total) by mouth 2 (two) times daily. Patient taking differently: Take 20 mEq by mouth daily.  01/25/19  Yes Croitoru, [name], MD   predniSONE (DELTASONE) 10 MG tablet Take 10 mg by mouth daily as needed (as directed for gout flares).  02/15/19  Yes [provider]  ranolazine (RANEXA) 500 MG 12 hr tablet Take 1 tablet (500 mg  total) by mouth 2 (two) times daily. 04/21/19  Yes Croitoru, [name], MD  rosuvastatin (CRESTOR) 20 MG tablet TAKE 1 TABLET AT BEDTIME Patient taking differently: Take 20 mg by mouth at bedtime.  04/07/19  Yes Croitoru, [name], MD  saxagliptin HCl (ONGLYZA) 2.5 MG TABS tablet Take 2.5 mg by mouth daily.  12/08/17  Yes Almyra Deforest, PA  spironolactone (ALDACTONE) 25 MG tablet Take 25 mg by mouth at bedtime.  03/31/19  Yes [provider]  torsemide (DEMADEX) 20 MG tablet Take 3 tablets (60 mg total) by mouth daily. Patient taking differently: Take 20-40 mg by mouth See admin instructions. Take 40 mg by mouth in the morning and 20 mg in the afternoon 03/28/19  Yes Croitoru, [name], MD  triamcinolone cream (KENALOG) 0.1 % Apply 1 application topically 2 (two) times daily as needed (itching).  08/24/14  Yes [provider]  clotrimazole (LOTRIMIN) 1 % cream Apply 1 application topically 2 (two) times daily as needed (rash). Patient not taking: Reported on 05/31/2019 05/02/18   Norval Morton, MD    Physical Exam: Vitals:   06/01/19 0000 06/01/19 0010 06/01/19 0013 06/01/19 0015  BP:  (!) 142/61  (!) 127/54  Pulse: 64 69 65   Resp:      Temp:      TempSrc:      SpO2: 100% 100% 100%   Weight:      Height:          Constitutional: NAD, calm, comfortable.  Morbid obesity.  Acute and chronic ill-appearing. Eyes: PERRL, lids and conjunctivae normal ENMT: Mucous membranes are moist. Posterior pharynx clear of any exudate or lesions.Normal dentition.  Neck: normal, supple, no masses, no thyromegaly Respiratory: clear to auscultation bilaterally, no wheezing, no crackles. Normal respiratory effort. No accessory muscle use.  Cardiovascular: Regular rate and rhythm, no murmurs / rubs /  gallops. No extremity edema. 2+ pedal pulses. No carotid bruits.  Abdomen: no tenderness, no masses palpated. No hepatosplenomegaly. Bowel sounds positive.  Musculoskeletal: no clubbing / cyanosis. No joint deformity upper and lower extremities. Good ROM, no contractures. Normal muscle tone.  Skin: no rashes, lesions, ulcers. No induration Neurologic: CN 2-12 grossly intact. Sensation intact, DTR normal. Strength 5/5 in all 4.  Psychiatric: Normal judgment and insight. Alert and oriented x 3. Normal mood.     Labs on Admission: I have personally reviewed following labs and imaging studies  CBC: Recent Labs  Lab 05/31/19 1315  WBC 7.5  HGB 11.7*  HCT 38.8  MCV 91.9  PLT 220   Basic Metabolic Panel: Recent Labs  Lab 05/31/19 1315  Librado Guandique 137  K 3.3*  CL 92*  CO2 34*  GLUCOSE 147*  BUN 60*  CREATININE 1.97*  CALCIUM 9.4   GFR: Estimated Creatinine Clearance: 29.2 mL/min (A) (by C-G formula based on SCr of 1.97 mg/dL (H)). Liver Function Tests: No results for input(s): AST, ALT, ALKPHOS, BILITOT, PROT, ALBUMIN in the last 168 hours. No results for input(s): LIPASE, AMYLASE in the last 168 hours. No results for input(s): AMMONIA in the last 168 hours. Coagulation Profile: No results for input(s): INR, PROTIME in the last 168 hours. Cardiac Enzymes: No results for input(s): CKTOTAL, CKMB, CKMBINDEX, TROPONINI in the last 168 hours. BNP (last 3 results) Recent Labs    01/21/19 1056  PROBNP 929*   HbA1C: No results for input(s): HGBA1C in the last 72 hours. CBG: No results for input(s): GLUCAP in the last 168 hours. Lipid Profile: No results  for input(s): CHOL, HDL, LDLCALC, TRIG, CHOLHDL, LDLDIRECT in the last 72 hours. Thyroid Function Tests: No results for input(s): TSH, T4TOTAL, FREET4, T3FREE, THYROIDAB in the last 72 hours. Anemia Panel: No results for input(s): VITAMINB12, FOLATE, FERRITIN, TIBC, IRON, RETICCTPCT in the last 72 hours. Urine analysis:      Component Value Date/Time   COLORURINE YELLOW 05/02/2018 1019   APPEARANCEUR CLEAR 05/02/2018 1019   LABSPEC 1.009 05/02/2018 1019   PHURINE 5.0 05/02/2018 1019   GLUCOSEU NEGATIVE 05/02/2018 1019   HGBUR NEGATIVE 05/02/2018 1019   BILIRUBINUR NEGATIVE 05/02/2018 1019   KETONESUR NEGATIVE 05/02/2018 1019   PROTEINUR NEGATIVE 05/02/2018 1019   UROBILINOGEN 0.2 12/06/2014 1531   NITRITE NEGATIVE 05/02/2018 1019   LEUKOCYTESUR SMALL (A) 05/02/2018 1019   Sepsis Labs: !!!!!!!!!!!!!!!!!!!!!!!!!!!!!!!!!!!!!!!!!!!! @LABRCNTIP (procalcitonin:4,lacticidven:4) )No results found for this or any previous visit (from the past 240 hour(s)).   Radiological Exams on Admission: DG Chest 2 View  Result Date: 05/31/2019 CLINICAL DATA:  Chest pain EXAM: CHEST - 2 VIEW COMPARISON:  03/14/2019 FINDINGS: There is no focal consolidation. There is no pleural effusion or pneumothorax. The heart and mediastinal contours are unremarkable. There is evidence of prior CABG. There is a cardiac pacemaker present. There is no acute osseous abnormality. IMPRESSION: No active cardiopulmonary disease. Electronically Signed   By: Kathreen Devoid   On: 05/31/2019 13:54   DG Shoulder Left  Result Date: 06/01/2019 CLINICAL DATA:  Left shoulder pain EXAM: LEFT SHOULDER - 2+ VIEW COMPARISON:  None. FINDINGS: Moderate degenerative changes in the left AC and glenohumeral joints. A shin no visible fracture. No subluxation or dislocation. IMPRESSION: Moderate degenerative changes.  No acute bony abnormality. Electronically Signed   By: Rolm Baptise M.D.   On: 06/01/2019 01:55     All images have been reviewed by me personally.  EKG: Independently reviewed.   Assessment/Plan Active Problems:   Essential hypertension   Morbid (severe) obesity due to excess calories (HCC)   OSA (obstructive sleep apnea)   CAD S/P percutaneous coronary angioplasty   S/P CABG x 5   CKD (chronic kidney disease), stage III   DM2 (diabetes mellitus,  type 2) (HCC)   Chronic respiratory failure with hypoxia and hypercapnia (HCC)   Chest pain   Elevated troponin   #Chest pain with typical and atypical features #Elevated troponin #History of CAD #Hx of s/P CABG October 7371, diastolic CHF, BiV AICD # chronic hypoxic respiratory failure on 3 O2   Patient presented with chest pain with typical and atypical features.  Troponins are mildly elevated at 22 and 20 in the setting of the same renal function and a history of negative troponin in the past.  EKG showed paced rhythm.  The clinical manifestations is concerning for unstable angina.    Patient's cardiac cath report from January 2021 did not show any acute abnormalities over previous caths ED provider had already talked to cardiology moonlighter who requests D-dimer, which is currently pending.  If positive patient will need a VQ scan given her CKD.  -Telemetry monitoring -Trend troponins -TSH and hemoglobin A1c -Pacemaker interrogation -Repeat echocardiogram -D-dimer is pending.  If positive, she will need VQ scan -Cardiology was consulted by ED provider  # CKD stage III, baseline creatinine 1.77-2.12  -BUN 60 and Cr 1.97 which appears to be in baseline - monitor BMP   # T2DM #Morbid obesity with BMI 42.5 Hemoglobin A1c, Glucose before meals and at bedtime -Weight loss per PCP   # HTN and HLD  Chronic and stable Continue home meds   # OSA but unable to tolerate CPAP  continue home O2 3 LNC             DVT prophylaxis: Heparin subcu Code Status: Full code Family Communication: Unable to reach family past midnight Disposition Plan: Home Consults called: Cardiology Admission status: Observation   Time Spent: 65 minutes.  >50% of the time was devoted to discussing the patients care, assessment, plan and disposition with other care givers along with counseling the patient about the risks and benefits of treatment.    Charlann Lange MD [facility]  If 7PM-7AM,  please contact night-coverage   06/01/2019, 1:58 AM

## 2019-06-01 NOTE — ED Notes (Signed)
House coverage paged to notify about pt D-Dimer result.

## 2019-06-02 DIAGNOSIS — R079 Chest pain, unspecified: Secondary | ICD-10-CM | POA: Diagnosis not present

## 2019-06-02 DIAGNOSIS — I5042 Chronic combined systolic (congestive) and diastolic (congestive) heart failure: Secondary | ICD-10-CM | POA: Diagnosis not present

## 2019-06-02 DIAGNOSIS — N183 Chronic kidney disease, stage 3 unspecified: Secondary | ICD-10-CM | POA: Diagnosis not present

## 2019-06-02 DIAGNOSIS — I130 Hypertensive heart and chronic kidney disease with heart failure and stage 1 through stage 4 chronic kidney disease, or unspecified chronic kidney disease: Secondary | ICD-10-CM | POA: Diagnosis not present

## 2019-06-02 LAB — BASIC METABOLIC PANEL
Anion gap: 10 (ref 5–15)
BUN: 51 mg/dL — ABNORMAL HIGH (ref 8–23)
CO2: 36 mmol/L — ABNORMAL HIGH (ref 22–32)
Calcium: 9.3 mg/dL (ref 8.9–10.3)
Chloride: 92 mmol/L — ABNORMAL LOW (ref 98–111)
Creatinine, Ser: 2.19 mg/dL — ABNORMAL HIGH (ref 0.44–1.00)
GFR calc Af Amer: 25 mL/min — ABNORMAL LOW (ref >60)
GFR calc non Af Amer: 21 mL/min — ABNORMAL LOW (ref >60)
Glucose, Bld: 157 mg/dL — ABNORMAL HIGH (ref 70–99)
Potassium: 3.7 mmol/L (ref 3.5–5.1)
Sodium: 138 mmol/L (ref 135–145)

## 2019-06-02 LAB — GLUCOSE, CAPILLARY
Glucose-Capillary: 119 mg/dL — ABNORMAL HIGH (ref 70–99)
Glucose-Capillary: 127 mg/dL — ABNORMAL HIGH (ref 70–99)

## 2019-06-02 LAB — MAGNESIUM: Magnesium: 2.1 mg/dL (ref 1.7–2.4)

## 2019-06-02 LAB — BRAIN NATRIURETIC PEPTIDE: B Natriuretic Peptide: 333.7 pg/mL — ABNORMAL HIGH (ref 0.0–100.0)

## 2019-06-02 MED ORDER — ISOSORBIDE MONONITRATE ER 30 MG PO TB24: 90.0000 mg | ORAL_TABLET | Freq: Every day | ORAL | 2 refills | Status: DC

## 2019-06-02 MED ORDER — RANOLAZINE ER 1000 MG PO TB12: 1000.0000 mg | ORAL_TABLET | Freq: Two times a day (BID) | ORAL | 2 refills | Status: DC

## 2019-06-02 NOTE — Care Management (Signed)
1338 06-02-19 Physical Therapy made recommendations for home health PT Services. Case Manager spoke with patient regarding home needs and patient declined the need for services. Patient states she receives assistance via remote health where a paramedic visits her weekly and assists her. Patient feels that at this time she would not benefit from home health services. Case Manager did make patient aware that if she changes her mind in the future to contact her primary care provider for orders. Staff RN aware. No further needs from Case Manager at this time. Bethena Roys, RN,BSN Case Manager

## 2019-06-02 NOTE — Evaluation (Signed)
Physical Therapy Evaluation Patient Details Name: [name] MRN: [id number] DOB: 1945-11-18 Today's Date: 06/02/2019   History of Present Illness  74 y.o. female with a hx of CAD s/p CABG (11/2014: LIMA-LAD, SVG-D1, SVG-OM1-OM2, SVG-PDA), chronic systolic and diastolic heart failure, NICM, CHB s/p ICD, morbid obesity, adrenal insufficiency, DM2, HTN, CKD stage III, gout, OSA and asthma who is being seen for chest pain.  Clinical Impression  Pt admitted with above diagnosis. Pt was able to ambulate on unit with supervsion.  No LOB.  Was on 3LO2 at home and currently doing well with this.  Will follow acutely.  Pt currently with functional limitations due to the deficits listed below (see PT Problem List). Pt will benefit from skilled PT to increase their independence and safety with mobility to allow discharge to the venue listed below.      Follow Up Recommendations Home health PT;Supervision - Intermittent    Equipment Recommendations  None recommended by PT    Recommendations for Other Services       Precautions / Restrictions Precautions Precautions: Fall Restrictions Weight Bearing Restrictions: No      Mobility  Bed Mobility Overal bed mobility: Independent                Transfers Overall transfer level: Independent               General transfer comment: Transfer to 3N1 without assist. cleaned self without assist  Ambulation/Gait Ambulation/Gait assistance: Supervision Gait Distance (Feet): 200 Feet Assistive device: None Gait Pattern/deviations: Decreased stride length;Step-through pattern;Wide base of support   Gait velocity interpretation: <1.8 ft/sec, indicate of risk for recurrent falls General Gait Details: Pt was able to ambulate without device with overall good cadence.  Pt does c/o pain right LE the longer she walked.  No LOB with min chlallenges to balance  Stairs            Wheelchair Mobility    Modified Rankin (Stroke  Patients Only)       Balance Overall balance assessment: Needs assistance         Standing balance support: No upper extremity supported;During functional activity Standing balance-Leahy Scale: Fair                               Pertinent Vitals/Pain Pain Assessment: Faces Faces Pain Scale: Hurts even more Pain Location: right LE Pain Descriptors / Indicators: Sore Pain Intervention(s): Limited activity within patient's tolerance;Monitored during session;Repositioned    Home Living Family/patient expects to be discharged to:: Private residence Living Arrangements: Children Available Help at Discharge: Family;Available PRN/intermittently(son works days) Type of Home: Apartment Home Access: Level entry     Home Layout: One level Home Equipment: Environmental consultant - 2 wheels;Shower seat;Grab bars - tub/shower Additional Comments: 3LO2 home O2    Prior Function Level of Independence: Independent         Comments: Erich Montane comes in and helps weekly     Hand Dominance   Dominant Hand: Right    Extremity/Trunk Assessment   Upper Extremity Assessment Upper Extremity Assessment: Defer to OT evaluation    Lower Extremity Assessment Lower Extremity Assessment: Generalized weakness    Cervical / Trunk Assessment Cervical / Trunk Assessment: Normal  Communication   Communication: No difficulties  Cognition Arousal/Alertness: Awake/alert Behavior During Therapy: WFL for tasks assessed/performed Overall Cognitive Status: Within Functional Limits for tasks assessed  General Comments General comments (skin integrity, edema, etc.): VSS on 3LO2    Exercises General Exercises - Lower Extremity Ankle Circles/Pumps: AROM;Both;10 reps;Seated Long Arc Quad: AROM;Both;10 reps;Seated   Assessment/Plan    PT Assessment Patient needs continued PT services  PT Problem List Decreased activity tolerance;Decreased  balance;Decreased mobility;Decreased safety awareness;Decreased knowledge of precautions;Decreased knowledge of use of DME       PT Treatment Interventions DME instruction;Gait training;Functional mobility training;Therapeutic activities;Therapeutic exercise;Balance training;Patient/family education    PT Goals (Current goals can be found in the Care Plan section)  Acute Rehab PT Goals Patient Stated Goal: to go home PT Goal Formulation: With patient Time For Goal Achievement: 06/16/19 Potential to Achieve Goals: Good    Frequency Min 3X/week   Barriers to discharge        Co-evaluation               AM-PAC PT "6 Clicks" Mobility  Outcome Measure Help needed turning from your back to your side while in a flat bed without using bedrails?: None Help needed moving from lying on your back to sitting on the side of a flat bed without using bedrails?: None Help needed moving to and from a bed to a chair (including a wheelchair)?: None Help needed standing up from a chair using your arms (e.g., wheelchair or bedside chair)?: None Help needed to walk in hospital room?: None Help needed climbing 3-5 steps with a railing? : None 6 Click Score: 24    End of Session Equipment Utilized During Treatment: Gait belt Activity Tolerance: Patient limited by fatigue Patient left: in chair;with call bell/phone within reach Nurse Communication: Mobility status PT Visit Diagnosis: Muscle weakness (generalized) (M62.81)    Time: 9937-1696 PT Time Calculation (min) (ACUTE ONLY): 30 min   Charges:   PT Evaluation $PT Eval Moderate Complexity: 1 Mod PT Treatments $Gait Training: 8-22 mins        Nels Munn W,PT Acute Rehabilitation Services Pager:  [phone]  Office:  [place] 06/02/2019, 12:56 PM

## 2019-06-02 NOTE — Discharge Instructions (Signed)
   Your [facility] team (Cardiologist [Heart Doctor] and Advanced Practice Provider [Physician Assistant; Nurse Practitioner]) has arranged for your next office appointment to be a virtual visit (also known as "Telehealth", "Telemedicine", "E-Visit").   We now offer virtual visits for all our patients.  This helps us to expand our ability to see patients in a timely and safe manner.  These visits are billed to your insurance just like traditional, in person, appointments.  Please review this IMPORTANT information about your upcoming appointment.   **PLEASE READ THE SECTION BELOW LABELED "CONSENT".**   **CALL OUR OFFICE WITH QUESTIONS.**   WHAT YOU NEED FOR YOUR VIRTUAL VISIT:  You will need a SmartPhone with microphone and video capability.  [If you are using MyChart to connect to your visit, it is also possible to use a desktop/laptop computer (with an Internet connection), as long as you have microphone and video capability.]  You will need to use Chrome, Edge or Safari as your default web browser.  We highly recommend that you have a MyChart account as this will make connecting to your visit seamless.  A MyChart account not only allows you to connect to your provider for a virtual visit, but also allows you to see the results of all your tests, provider notes, medications and upcoming appointments.    A MyChart account is not absolutely necessary.  We can still complete your visit if you do not have one.    If you do not have a computer or SmartPhone with video/microphone capability or your Internet/cell service is weak on the day of your visit, we will do your visit by telephone.    A blood pressure cuff and scale are essential to collect your vital signs at home.  If you do not have these and you are unable to obtain them, please contact our office so that we can make arrangements for you.  If you have a pulse oximeter, Apple watch, Kardia mobile device, etc, you can collect data  from these devices as well to share with the provider for your visit.  These devices are not required for a virtual visit.    WHAT TO DO ON THE DAY OF YOUR APPOINTMENT: 30 minutes before your appointment:  Take your blood pressure, pulse or heart rate (if your blood pressure machine is able to collect it) and weight.  Write all these numbers down so you can give it to the nurse or medical assistant that calls.  Get all of the medications you currently take and put them where you will be sitting for the appointment.  The nurse/medical assistant will go over these with you when he/she calls.  15 minutes before your appointment:  You will receive a phone call from a nurse or medical assistant from our office.  The caller ID on your phone may indicate that the caller is either "[facility]" or "[facility]".  However, the number may come across as spam.  Please turn off any spam blocker so you do not miss the call.  The nurse will:  Ask for your blood pressure, pulse, weight, height.  Go over all of your medications to make sure your chart is correct.  Review your allergies, smoking history, reason for appointment, etc.   Give you instructions on how to connect with the video platform or telephone.  IF YOUR VISIT IS BY TELEPHONE ONLY: After the nurse finishes getting you ready for the visit, your provider will call you on the phone   number you provide to us.   TO CONNECT WITH YOUR PROVIDER FOR YOUR APPOINTMENT (BY VIDEO): You will either connect with the provider with your MyChart account or (if you are not using MyChart) with a link sent to your SmartPhone by text message.  If you are using MyChart, see below.  If you are not using MyChart, the nurse will send you the text message during the phone call.     If you are connecting with your MyChart account:   (The nurse that calls you will tell you when to do this):  You will log into your account. At the top of your home screen, you  should see the following prompt that tells you to "Begin your video visit with . . . ".  Click the green button (BEGIN VISIT).    The next screen will say "It's time to start your video visit!" Click the green button (BEGIN VIDEO VISIT)    There may be a screen that appears that asks for permission to use your camera and/or microphone.  Click ALLOW.  This will open the browser where your appointment will take place.   You may see the message: "Welcome.  Waiting for the call to begin."  Or, you will see that the nurse or your provider are already "in" the room waiting on you.   If you are connecting with a link sent to your SmartPhone via text: The nurse that calls you will send the link by text message. Click on the link (it should look something like this):     If asked to give permission to use the camera and or microphone, click ALLOW This will open the browser where your appointment will take place.      You may see the message: "Welcome.  Waiting for the call to begin."  Or, you will see that the nurse or your provider are already "in" the room waiting on you.    The controls for your visit look like the picture below.  Please note that this is what the microphone and camera look like when they are ON.  If muted, they will have a line through them.    After the appointment: Once your provider leaves the appointment, he/she will go over instructions with the nurse/medical assistant.  If needed, this person will call you with any instructions, appointments, etc.   A copy of your After Visit Summary (AVS) will be available later that day in your MyChart account.  This document will have all of your instructions, medications, appointments, etc.  If you are not using MyChart, we will mail it to your home.     *CONSENT FOR TELE-HEALTH VISIT - PLEASE REVIEW* By participating in the scheduled virtual visit (and any virtual visit scheduled within 365 days of the printing of this  document), I agree to the following:   I hereby voluntarily request, consent and authorize [facility] and its employed or contracted physicians, physician assistants, nurse practitioners or other licensed health care professionals (the Practitioner), to provide me with telemedicine health care services (the "Services") as deemed necessary by the treating Practitioner. I acknowledge and consent to receive the Services by the Practitioner via telemedicine. I understand that the telemedicine visit will involve communicating with the Practitioner through live audiovisual communication technology and the disclosure of certain medical information by electronic transmission. I acknowledge that I have been given the opportunity to request an in-person assessment or other available alternative prior to the telemedicine visit   and am voluntarily participating in the telemedicine visit.  I understand that I have the right to withhold or withdraw my consent to the use of telemedicine in the course of my care at any time, without affecting my right to future care or treatment, and that the Practitioner or I may terminate the telemedicine visit at any time. I understand that I have the right to inspect all information obtained and/or recorded in the course of the telemedicine visit and may receive copies of available information for a reasonable fee.  I understand that some of the potential risks of receiving the Services via telemedicine include:  . Delay or interruption in medical evaluation due to technological equipment failure or disruption; . Information transmitted may not be sufficient (e.g. poor resolution of images) to allow for appropriate medical decision making by the Practitioner; and/or  . In rare instances, security protocols could fail, causing a breach of personal health information.  Furthermore, I acknowledge that it is my responsibility to provide information about my medical history, conditions  and care that is complete and accurate to the best of my ability. I acknowledge that Practitioner's advice, recommendations, and/or decision may be based on factors not within their control, such as incomplete or inaccurate data provided by me or distortions of diagnostic images or specimens that may result from electronic transmissions. I understand that the practice of medicine is not an exact science and that Practitioner makes no warranties or guarantees regarding treatment outcomes. I acknowledge that a copy of this consent can be made available to me via my patient portal ([place] MyChart), or I can request a printed copy by calling the office of [facility].    I understand that my insurance will be billed for this visit.   I have read or had this consent read to me. . I understand the contents of this consent, which adequately explains the benefits and risks of the Services being provided via telemedicine.  . I have been provided ample opportunity to ask questions regarding this consent and the Services and have had my questions answered to my satisfaction. . I give my informed consent for the services to be provided through the use of telemedicine in my medical care   

## 2019-06-02 NOTE — Discharge Summary (Signed)
Physician Discharge Summary  [name] TKZ:[id number] DOB: [birth year] DOA: 05/31/2019  PCP: [facility]  Admit date: 05/31/2019 Discharge date: 06/02/2019  Admitted From: home Disposition:  home  Recommendations for Outpatient Follow-up:  1. Follow up with PCP in 1 week 2. Follow up with Cardiology next week 4/28 and 5/20  Discharge Condition: Stable stable CODE STATUS: Full code Diet recommendation: Heart healthy  Brief/Interim Summary: Alicia Giebler [name] a 74 y.o.femalewith medical history significant ofmorbid obesity with BMI 42.5, CAD, s/p CABG October 5732, ICM, diastolic CHF, BiV AICD, chronic hypoxic respiratory failure on 3L O2 [place], CKD stage III, T2DM, HTN, HLD, CHF TIA, OSA unable to tolerate CPAP, who presented with chest pain. Patient reports that she started to have intermittent chest pain 3 days ago. Her chest pain was located to her midsternal chest area, sharp pain, mild to moderate pain, radiating to left arm and back. Exertion and activities make it worse and resting makes it better. Associated symptoms included mild worsening shortness of breath from her baseline chronic shortness of breath. She endorses intermittent nausea and night sweats 4 weeks, and believes that the symptoms were due to her "gallbladder being removed last year". Patient reports that her chest pain became constant with similar characteristics. She took a total of 4 tablets NTG SLwith some improvement of her chest pain.Patient came to the ED for further evaluation.   Cardiology was consulted.  Due to elevated D-dimer, she underwent VQ scan which was negative for PE.  Her Ranexa and Imdur dosing were increased.  On day of discharge, she had no further chest pain and was discharged home in stable condition.  Discharge Diagnoses:  Active Problems:   Essential hypertension   Morbid (severe) obesity due to excess calories (HCC)   OSA (obstructive sleep apnea)   CAD  S/P percutaneous coronary angioplasty   S/P CABG x 5   CKD (chronic kidney disease), stage III   DM2 (diabetes mellitus, type 2) (HCC)   Chronic respiratory failure with hypoxia and hypercapnia (HCC)   Chest pain   Elevated troponin   Chest pain CAD, status post CABG -Troponin has been flat  -D-dimer elevated 1.57, VQ scan negative -Echocardiogram shows EF 50 to 55%, LV endocardial border not optimally defined to evaluate regional wall motion, diastolic function could not be evaluated -Cardiology consulted -Continue aspirin, Plavix, bisoprolol, Ranexa, Imdur, crestor.  Cardiology has adjusted Ranexa and Imdur dosing -No further chest pain  Chronic systolic and diastolic heart failure, NICM  -Continue Demadex, spironolactone  Complete heart block -ICD in place, device interrogated in the ER, no events, no increase in fluid noted by impedance  Chronic hypoxemic respiratory failure -Requires 3 L nasal cannula at baseline  CKD stage IV -Baseline creatinine 1.77-2.12 -Stable  OSA -Unable to tolerate CPAP  Type 2 diabetes -Hemoglobin A1c 7.1 -SSI  Gout -Continue uloric, colchicine   Morbid obesity -Estimated body mass index is 42.51 kg/m as calculated from the following:   Height as of this encounter: 5\' 3"  (1.6 m).   Weight as of this encounter: 108.9 kg.   Discharge Instructions  Discharge Instructions    (HEART FAILURE PATIENTS) Call MD:  Anytime you have any of the following symptoms: 1) 3 pound weight gain in 24 hours or 5 pounds in 1 week 2) shortness of breath, with or without a dry hacking cough 3) swelling in the hands, feet or stomach 4) if you have to sleep on extra pillows at night in order  to breathe.   Complete by: As directed    Call MD for:  difficulty breathing, headache or visual disturbances   Complete by: As directed    Call MD for:  extreme fatigue   Complete by: As directed    Call MD for:  persistant dizziness or light-headedness    Complete by: As directed    Call MD for:  persistant nausea and vomiting   Complete by: As directed    Call MD for:  severe uncontrolled pain   Complete by: As directed    Call MD for:  temperature >100.4   Complete by: As directed    Diet - low sodium heart healthy   Complete by: As directed    Discharge instructions   Complete by: As directed    You were cared for by a hospitalist during your hospital stay. If you have any questions about your discharge medications or the care you received while you were in the hospital after you are discharged, you can call the unit and ask to speak with the hospitalist on call if the hospitalist that took care of you is not available. Once you are discharged, your primary care physician will handle any further medical issues. Please note that NO REFILLS for any discharge medications will be authorized once you are discharged, as it is imperative that you return to your primary care physician (or establish a relationship with a primary care physician if you do not have one) for your aftercare needs so that they can reassess your need for medications and monitor your lab values.   Increase activity slowly   Complete by: As directed      Allergies as of 06/02/2019      Reactions   Ivp Dye [iodinated Diagnostic Agents] Shortness Of Breath   No reaction to PO contrast with non-ionic dye.06-25-2014/rsm   Shellfish Allergy Anaphylaxis   Shellfish-derived Products Anaphylaxis   Sulfa Antibiotics Shortness Of Breath   Iodine Hives   Atorvastatin Other (See Comments)   Pt states "causes bilateral leg pain/cramps."   Benadryl [diphenhydramine] Other (See Comments)   "THIS DRIVES ME CRAZY AND MAKES ME FEEL LIKE I AM DYING"   Doxycycline Other (See Comments)   Unknown reaction, per patient   Cephalexin Itching, Rash   Zithromax [azithromycin] Rash      Medication List    TAKE these medications   acetaminophen 500 MG tablet Commonly known as: TYLENOL Take  500-1,000 mg by mouth every 12 (twelve) hours as needed (for pain or headaches).   albuterol 108 (90 Base) MCG/ACT inhaler Commonly known as: VENTOLIN HFA Inhale 2 puffs into the lungs every 6 (six) hours as needed for wheezing or shortness of breath.   albuterol (2.5 MG/3ML) 0.083% nebulizer solution Commonly known as: PROVENTIL Take 3 mLs (2.5 mg total) by nebulization every 6 (six) hours as needed for wheezing or shortness of breath.   aspirin EC 81 MG tablet Take 1 tablet (81 mg total) by mouth daily.   Biotin 1000 MCG tablet Take 1,000 mcg by mouth daily.   bisoprolol 5 MG tablet Commonly known as: ZEBETA TAKE 1 TABLET EVERY DAY   budesonide-formoterol 160-4.5 MCG/ACT inhaler Commonly known as: SYMBICORT Inhale 2 puffs into the lungs 2 (two) times daily.   cetirizine 10 MG tablet Commonly known as: ZYRTEC Take 10 mg by mouth daily.   clopidogrel 75 MG tablet Commonly known as: PLAVIX TAKE 1 TABLET EVERY DAY What changed: when to take this  clotrimazole 1 % cream Commonly known as: LOTRIMIN Apply 1 application topically 2 (two) times daily as needed (rash).   clotrimazole 10 MG troche Commonly known as: MYCELEX Take 10 mg by mouth daily as needed (Sore Mouth/thrush).   colchicine 0.6 MG tablet Take 0.6 mg by mouth 2 (two) times daily as needed (for gout flares).   cycloSPORINE 0.05 % ophthalmic emulsion Commonly known as: RESTASIS Place 1 drop into both eyes 2 (two) times daily.   diclofenac sodium 1 % Gel Commonly known as: VOLTAREN Apply 2 g topically 4 (four) times daily. What changed:   when to take this  reasons to take this   EpiPen 2-Pak 0.3 mg/0.3 mL Soaj injection Generic drug: EPINEPHrine Inject 0.3 mg into the muscle once as needed (severe allergic reaction).   febuxostat 40 MG tablet Commonly known as: ULORIC Take 40 mg by mouth daily.   hydrocortisone 2.5 % cream Apply 1 application topically daily as needed (itching).    hydroxypropyl methylcellulose / hypromellose 2.5 % ophthalmic solution Commonly known as: ISOPTO TEARS / GONIOVISC Place 1 drop into both eyes 3 (three) times daily.   ipratropium 0.02 % nebulizer solution Commonly known as: ATROVENT Take 2.5 mLs (0.5 mg total) by nebulization 4 (four) times daily. What changed:   when to take this  reasons to take this   isosorbide mononitrate 30 MG 24 hr tablet Commonly known as: IMDUR Take 3 tablets (90 mg total) by mouth daily. What changed:   medication strength  how much to take   metolazone 2.5 MG tablet Commonly known as: ZAROXOLYN Take 1 tablet (2.5 mg total) by mouth daily as needed (for weight gain greater than 3 pounds). Take one tablet on Monday and one tablet on Thursday. What changed:   reasons to take this  additional instructions   Mucinex 600 MG 12 hr tablet Generic drug: guaiFENesin Take 600 mg by mouth at bedtime as needed for cough or to loosen phlegm.   nitroGLYCERIN 0.4 MG SL tablet Commonly known as: NITROSTAT Place 1 tablet (0.4 mg total) under the tongue every 5 (five) minutes as needed for chest pain.   ondansetron 4 MG disintegrating tablet Commonly known as: Zofran ODT Take 1 tablet (4 mg total) by mouth every 8 (eight) hours as needed for nausea or vomiting. What changed: reasons to take this   Onglyza 2.5 MG Tabs tablet Generic drug: saxagliptin HCl Take 2.5 mg by mouth daily.   OXYGEN Inhale 3 L/min into the lungs continuous.   polyethylene glycol 17 g packet Commonly known as: MIRALAX / GLYCOLAX Take 17 g by mouth in the morning.   potassium chloride SA 20 MEQ tablet Commonly known as: KLOR-CON Take 1 tablet (20 mEq total) by mouth 2 (two) times daily. What changed: when to take this   predniSONE 10 MG tablet Commonly known as: DELTASONE Take 10 mg by mouth daily as needed (as directed for gout flares).   ranolazine 1000 MG SR tablet Commonly known as: Ranexa Take 1 tablet (1,000 mg  total) by mouth 2 (two) times daily. What changed:   medication strength  how much to take   rosuvastatin 20 MG tablet Commonly known as: CRESTOR TAKE 1 TABLET AT BEDTIME   spironolactone 25 MG tablet Commonly known as: ALDACTONE Take 25 mg by mouth at bedtime.   torsemide 20 MG tablet Commonly known as: DEMADEX Take 3 tablets (60 mg total) by mouth daily. What changed:   how much to take  when  to take this  additional instructions   triamcinolone cream 0.1 % Commonly known as: KENALOG Apply 1 application topically 2 (two) times daily as needed (itching).      Follow-up Information    Pa, [place]. Schedule an appointment as soon as possible for a visit in 1 week(s).   Specialty: Family Medicine Contact information: [place] [address] [phone]        Ledora Bottcher, [place]. Go on 06/08/2019.   Specialties: Physician Assistant, Cardiology, Radiology Contact information: [address] [place] [address] [phone]        Jolaine Artist, MD. Go on 06/30/2019.   Specialty: Cardiology Contact information: [place] [address] [phone]          Allergies  Allergen Reactions  . Ivp Dye [Iodinated Diagnostic Agents] Shortness Of Breath    No reaction to PO contrast with non-ionic dye.06-25-2014/rsm  . Shellfish Allergy Anaphylaxis  . Shellfish-Derived Products Anaphylaxis  . Sulfa Antibiotics Shortness Of Breath  . Iodine Hives  . Atorvastatin Other (See Comments)    Pt states "causes bilateral leg pain/cramps."  . Benadryl [Diphenhydramine] Other (See Comments)    "THIS DRIVES ME CRAZY AND MAKES ME FEEL LIKE I AM DYING"  . Doxycycline Other (See Comments)    Unknown reaction, per patient  . Cephalexin Itching and Rash  . Zithromax [Azithromycin] Rash    Consultations:  Cardiology   Procedures/Studies: DG Chest 2  View  Result Date: 05/31/2019 CLINICAL DATA:  Chest pain EXAM: CHEST - 2 VIEW COMPARISON:  03/14/2019 FINDINGS: There is no focal consolidation. There is no pleural effusion or pneumothorax. The heart and mediastinal contours are unremarkable. There is evidence of prior CABG. There is a cardiac pacemaker present. There is no acute osseous abnormality. IMPRESSION: No active cardiopulmonary disease. Electronically Signed   By: Kathreen Devoid   On: 05/31/2019 13:54   NM Pulmonary Perfusion  Result Date: 06/01/2019 CLINICAL DATA:  Congestive heart failure, short of breath. Chronic hypertension. Morbid PC. EXAM: NUCLEAR MEDICINE PERFUSION LUNG SCAN TECHNIQUE: Perfusion images were obtained in multiple projections after intravenous injection of radiopharmaceutical. RADIOPHARMACEUTICALS:  1.6 mCi Tc-27m MAA COMPARISON:  Radiograph 05/31/2019 FINDINGS: Photopenic defect in the LEFT upper lobe relates to pacemaker on comparison radiograph. There are no wedge-shaped peripheral perfusion defects within LEFT or RIGHT lung to suggest acute pulmonary embolism. IMPRESSION: No evidence acute or chronic pulmonary embolism. Electronically Signed   By: Suzy Bouchard M.D.   On: 06/01/2019 16:31   DG Shoulder Left  Result Date: 06/01/2019 CLINICAL DATA:  Left shoulder pain EXAM: LEFT SHOULDER - 2+ VIEW COMPARISON:  None. FINDINGS: Moderate degenerative changes in the left AC and glenohumeral joints. A shin no visible fracture. No subluxation or dislocation. IMPRESSION: Moderate degenerative changes.  No acute bony abnormality. Electronically Signed   By: Rolm Baptise M.D.   On: 06/01/2019 01:55   ECHOCARDIOGRAM COMPLETE  Result Date: 06/01/2019    ECHOCARDIOGRAM REPORT   Patient Name:   [name] Date of Exam: 06/01/2019 Medical Rec #:  [id number]         Height:       63.0 in Accession #:    [id number]        Weight:       240.0 lb Date of Birth:  04-Mar-1945          BSA:  2.089 m Patient Age:    20 years           BP:           109/32 mmHg Patient Gender: F                 HR:           65 bpm. Exam Location:  Inpatient Procedure: 2D Echo Indications:    Chest Pain 786.50 / R07.9  History:        Patient has prior history of Echocardiogram examinations, most                 recent 01/18/2019. CHF, CAD, S/P CABG x 5, TIA; Risk                 Factors:Hypertension and Morbid obesity. Chronic respiratory                 failure.  Sonographer:    Vikki Ports Turrentine Referring Phys: [phone] NA LI  Sonographer Comments: Image acquisition challenging due to patient body habitus. Order stated do not administer definity IMPRESSIONS  1. Left ventricular ejection fraction, by estimation, is 50 to 55%. The left ventricle has low normal function. Left ventricular endocardial border not optimally defined to evaluate regional wall motion. Left ventricular diastolic function could not be evaluated.  2. Right ventricular systolic function is normal. The right ventricular size is normal. Tricuspid regurgitation signal is inadequate for assessing PA pressure.  3. Left atrial size was mild to moderately dilated.  4. The mitral valve is degenerative. No evidence of mitral valve regurgitation. No evidence of mitral stenosis.  5. The aortic valve is tricuspid. Aortic valve regurgitation is not visualized. Mild aortic valve stenosis. Aortic valve area, by VTI measures 1.78 cm. Aortic valve mean gradient measures 10.2 mmHg. Aortic valve Vmax measures 2.11 m/s.  6. The inferior vena cava is normal in size with greater than 50% respiratory variability, suggesting right atrial pressure of 3 mmHg. Comparison(s): Changes from prior study are noted. EF now low-normal, 50-55%. Very difficult study. FINDINGS  Left Ventricle: Left ventricular ejection fraction, by estimation, is 50 to 55%. The left ventricle has low normal function. Left ventricular endocardial border not optimally defined to evaluate regional wall motion. The left ventricular internal  cavity  size was normal in size. There is no left ventricular hypertrophy. Abnormal (paradoxical) septal motion consistent with post-operative status. Left ventricular diastolic function could not be evaluated due to mitral annular calcification (moderate or greater). Left ventricular diastolic function could not be evaluated. Normal left ventricular filling pressure. Right Ventricle: The right ventricular size is normal. No increase in right ventricular wall thickness. Right ventricular systolic function is normal. Tricuspid regurgitation signal is inadequate for assessing PA pressure. Left Atrium: Left atrial size was mild to moderately dilated. Right Atrium: Right atrial size was normal in size. Pericardium: Trivial pericardial effusion is present. Presence of pericardial fat pad. Mitral Valve: The mitral valve is degenerative in appearance. Moderate mitral annular calcification. No evidence of mitral valve regurgitation. No evidence of mitral valve stenosis. Tricuspid Valve: The tricuspid valve is grossly normal. Tricuspid valve regurgitation is trivial. No evidence of tricuspid stenosis. Aortic Valve: The aortic valve is tricuspid. . There is severe thickening and severe calcifcation of the aortic valve. Aortic valve regurgitation is not visualized. Mild aortic stenosis is present. Moderate aortic valve annular calcification. There is severe thickening of the aortic valve. There is severe calcifcation of the aortic valve. Aortic  valve mean gradient measures 10.2 mmHg. Aortic valve peak gradient measures 17.8 mmHg. Aortic valve area, by VTI measures 1.78 cm. Pulmonic Valve: The pulmonic valve was grossly normal. Pulmonic valve regurgitation is not visualized. No evidence of pulmonic stenosis. Aorta: The aortic root is normal in size and structure. Venous: The inferior vena cava is normal in size with greater than 50% respiratory variability, suggesting right atrial pressure of 3 mmHg. IAS/Shunts: The atrial  septum is grossly normal. Additional Comments: A pacer wire is visualized in the right atrium and right ventricle.  LEFT VENTRICLE PLAX 2D LVIDd:         4.49 cm  Diastology LVIDs:         2.87 cm  LV e' lateral:   10.50 cm/s LV PW:         0.95 cm  LV E/e' lateral: 8.4 LV IVS:        0.91 cm  LV e' medial:    5.40 cm/s LVOT diam:     2.00 cm  LV E/e' medial:  16.3 LV SV:         72 LV SV Index:   34 LVOT Area:     3.14 cm  RIGHT VENTRICLE RV S prime:     8.10 cm/s LEFT ATRIUM           Index LA diam:      4.90 cm 2.35 cm/m LA Vol (A4C): 88.4 ml 42.31 ml/m  AORTIC VALVE AV Area (Vmax):    1.47 cm AV Area (Vmean):   1.56 cm AV Area (VTI):     1.78 cm AV Vmax:           211.00 cm/s AV Vmean:          142.200 cm/s AV VTI:            0.402 m AV Peak Grad:      17.8 mmHg AV Mean Grad:      10.2 mmHg LVOT Vmax:         98.60 cm/s LVOT Vmean:        70.800 cm/s LVOT VTI:          0.228 m LVOT/AV VTI ratio: 0.57  AORTA Ao Root diam: 3.50 cm MITRAL VALVE MV Area (PHT): 2.48 cm     SHUNTS MV Decel Time: 306 msec     Systemic VTI:  0.23 m MV E velocity: 87.80 cm/s   Systemic Diam: 2.00 cm MV A velocity: 109.00 cm/s MV E/A ratio:  0.81 Eleonore Chiquito MD Electronically signed by Eleonore Chiquito MD Signature Date/Time: 06/01/2019/1:51:16 PM    Final    CUP PACEART REMOTE DEVICE CHECK  Result Date: 05/17/2019 Scheduled remote reviewed. Normal device function.  1 NSVT, 18 beats on 03/10/19. Routing for further review per protocol Next remote 91 days- JBox, RN/CVRS      Discharge Exam: Vitals:   06/01/19 2156 06/02/19 0234  BP: (!) 105/52 (!) 125/54  Pulse: 65 70  Resp:  18  Temp:  98 F (36.7 C)  SpO2:  99%    General: Pt is alert, awake, not in acute distress Cardiovascular: RRR, S1/S2 +, no edema Respiratory: CTA bilaterally, no wheezing, no rhonchi, no respiratory distress, no conversational dyspnea  Abdominal: Soft, NT, ND, bowel sounds + Extremities: no edema, no cyanosis Psych: Normal mood and  affect, stable judgement and insight     The results of significant diagnostics from this hospitalization (including imaging, microbiology, ancillary and laboratory) are listed  below for reference.     Microbiology: Recent Results (from the past 240 hour(s))  SARS CORONAVIRUS 2 (TAT 6-24 HRS) Nasopharyngeal Nasopharyngeal Swab     Status: None   Collection Time: 06/01/19  2:23 AM   Specimen: Nasopharyngeal Swab  Result Value Ref Range Status   SARS Coronavirus 2 NEGATIVE NEGATIVE Final    Comment: (NOTE) SARS-CoV-2 target nucleic acids are NOT DETECTED. The SARS-CoV-2 RNA is generally detectable in upper and lower respiratory specimens during the acute phase of infection. Negative results do not preclude SARS-CoV-2 infection, do not rule out co-infections with other pathogens, and should not be used as the sole basis for treatment or other patient management decisions. Negative results must be combined with clinical observations, patient history, and epidemiological information. The expected result is Negative. Fact Sheet for Patients: SugarRoll.be Fact Sheet for Healthcare Providers: https://www.woods-mathews.com/ This test is not yet approved or cleared by the [place] FDA and  has been authorized for detection and/or diagnosis of SARS-CoV-2 by FDA under an Emergency Use Authorization (EUA). This EUA will remain  in effect (meaning this test can be used) for the duration of the COVID-19 declaration under Section 56 4(b)(1) of the Act, 21 U.S.C. section 360bbb-3(b)(1), unless the authorization is terminated or revoked sooner. Performed at [facility], [place] [address] [place],  [address]      Labs: BNP (last 3 results) Recent Labs    03/14/19 1800 06/02/19 0530  BNP 227.3* 361.4*   Basic Metabolic Panel: Recent Labs  Lab 05/31/19 1315 06/01/19 0125 06/02/19 0530  NA 137 139 138  K 3.3* 3.4* 3.7  CL 92*  95* 92*  CO2 34* 32 36*  GLUCOSE 147* 142* 157*  BUN 60* 53* 51*  CREATININE 1.97* 1.85* 2.19*  CALCIUM 9.4 9.3 9.3  MG  --   --  2.1   Liver Function Tests: Recent Labs  Lab 06/01/19 0125  AST 21  ALT 18  ALKPHOS 74  BILITOT 0.6  PROT 6.5  ALBUMIN 3.3*   No results for input(s): LIPASE, AMYLASE in the last 168 hours. No results for input(s): AMMONIA in the last 168 hours. CBC: Recent Labs  Lab 05/31/19 1315  WBC 7.5  HGB 11.7*  HCT 38.8  MCV 91.9  PLT 182   Cardiac Enzymes: No results for input(s): CKTOTAL, CKMB, CKMBINDEX, TROPONINI in the last 168 hours. BNP: Invalid input(s): POCBNP CBG: Recent Labs  Lab 06/01/19 1111 06/01/19 1654 06/01/19 2339 06/02/19 0728 06/02/19 1113  GLUCAP 114* 178* 166* 119* 127*   D-Dimer Recent Labs    06/01/19 0128  DDIMER 1.57*   Hgb A1c Recent Labs    06/01/19 0125  HGBA1C 7.1*   Lipid Profile No results for input(s): CHOL, HDL, LDLCALC, TRIG, CHOLHDL, LDLDIRECT in the last 72 hours. Thyroid function studies Recent Labs    06/01/19 0135  TSH 2.733   Anemia work up No results for input(s): VITAMINB12, FOLATE, FERRITIN, TIBC, IRON, RETICCTPCT in the last 72 hours. Urinalysis    Component Value Date/Time   COLORURINE YELLOW 05/02/2018 1019   APPEARANCEUR CLEAR 05/02/2018 1019   LABSPEC 1.009 05/02/2018 1019   PHURINE 5.0 05/02/2018 1019   GLUCOSEU NEGATIVE 05/02/2018 1019   HGBUR NEGATIVE 05/02/2018 1019   BILIRUBINUR NEGATIVE 05/02/2018 1019   KETONESUR NEGATIVE 05/02/2018 1019   PROTEINUR NEGATIVE 05/02/2018 1019   UROBILINOGEN 0.2 12/06/2014 1531   NITRITE NEGATIVE 05/02/2018 1019   LEUKOCYTESUR SMALL (A) 05/02/2018 1019   Sepsis Labs  Invalid input(s): PROCALCITONIN,  WBC,  LACTICIDVEN Microbiology Recent Results (from the past 240 hour(s))  SARS CORONAVIRUS 2 (TAT 6-24 HRS) Nasopharyngeal Nasopharyngeal Swab     Status: None   Collection Time: 06/01/19  2:23 AM   Specimen: Nasopharyngeal Swab   Result Value Ref Range Status   SARS Coronavirus 2 NEGATIVE NEGATIVE Final    Comment: (NOTE) SARS-CoV-2 target nucleic acids are NOT DETECTED. The SARS-CoV-2 RNA is generally detectable in upper and lower respiratory specimens during the acute phase of infection. Negative results do not preclude SARS-CoV-2 infection, do not rule out co-infections with other pathogens, and should not be used as the sole basis for treatment or other patient management decisions. Negative results must be combined with clinical observations, patient history, and epidemiological information. The expected result is Negative. Fact Sheet for Patients: SugarRoll.be Fact Sheet for Healthcare Providers: https://www.woods-mathews.com/ This test is not yet approved or cleared by the [place] FDA and  has been authorized for detection and/or diagnosis of SARS-CoV-2 by FDA under an Emergency Use Authorization (EUA). This EUA will remain  in effect (meaning this test can be used) for the duration of the COVID-19 declaration under Section 56 4(b)(1) of the Act, 21 U.S.C. section 360bbb-3(b)(1), unless the authorization is terminated or revoked sooner. Performed at [facility], [facility] [address] [place], [place] [address]      Patient was seen and examined on the day of discharge and was found to be in stable condition. Time coordinating discharge: 25 minutes including assessment and coordination of care, as well as examination of the patient.   SIGNED:  Dessa Phi, DO [facility] 06/02/2019, 12:47 PM

## 2019-06-02 NOTE — Care Management Obs Status (Signed)
[facility] NOTIFICATION   Patient Details  Name: [name] MRN: [id number] Date of Birth: [birth year]   Medicare Observation Status Notification Given:  Yes    Bethena Roys, RN 06/02/2019, 10:55 AM

## 2019-06-02 NOTE — Progress Notes (Addendum)
Progress Note  Patient Name: [name] Date of Encounter: 06/02/2019  Primary Cardiologist: Sanda Klein, MD   Subjective   No further chest pain. She rested well last night. Eager for discharge today.  Inpatient Medications    Scheduled Meds: . aspirin EC  81 mg Oral Daily  . bisoprolol  5 mg Oral Daily  . clopidogrel  75 mg Oral QHS  . febuxostat  40 mg Oral Daily  . heparin  5,000 Units Subcutaneous Q8H  . insulin aspart  0-9 Units Subcutaneous TID WC  . isosorbide mononitrate  90 mg Oral Daily  . polyethylene glycol  17 g Oral q AM  . ranolazine  1,000 mg Oral BID  . rosuvastatin  20 mg Oral QHS  . sodium chloride flush  3 mL Intravenous Once  . spironolactone  25 mg Oral QHS  . torsemide  20 mg Oral q1600  . torsemide  40 mg Oral Daily   Continuous Infusions:  PRN Meds: acetaminophen **OR** acetaminophen, albuterol, colchicine, ipratropium, nitroGLYCERIN, ondansetron **OR** ondansetron (ZOFRAN) IV   Vital Signs    Vitals:   06/01/19 2151 06/01/19 2156 06/02/19 0232 06/02/19 0234  BP: (!) 98/48 (!) 105/52  (!) 125/54  Pulse: 66 65  70  Resp: 14   18  Temp: 98.1 F (36.7 C)   98 F (36.7 C)  TempSrc: Oral   Oral  SpO2: 95%   99%  Weight:   110 kg   Height:        Intake/Output Summary (Last 24 hours) at 06/02/2019 0910 Last data filed at 06/01/2019 2208 Gross per 24 hour  Intake 300 ml  Output --  Net 300 ml   Last 3 Weights 06/02/2019 05/31/2019 03/28/2019  Weight (lbs) 242 lb 8 oz 240 lb 253 lb  Weight (kg) 109.997 kg 108.863 kg 114.76 kg      Telemetry    Paced rhythm in the 60s - Personally Reviewed  ECG    No new tracings - Personally Reviewed  Physical Exam   GEN: No acute distress.   Neck: No JVD Cardiac: RRR, no murmurs, rubs, or gallops.  Respiratory: Clear to auscultation bilaterally. GI: Soft, nontender, non-distended  MS: No edema; No deformity. Neuro:  Nonfocal  Psych: Normal affect   Labs    High Sensitivity  Troponin:   Recent Labs  Lab 05/31/19 1315 05/31/19 1953 06/01/19 0125 06/01/19 0633  TROPONINIHS 22* 20* 19* 19*      Chemistry Recent Labs  Lab 05/31/19 1315 06/01/19 0125 06/02/19 0530  NA 137 139 138  K 3.3* 3.4* 3.7  CL 92* 95* 92*  CO2 34* 32 36*  GLUCOSE 147* 142* 157*  BUN 60* 53* 51*  CREATININE 1.97* 1.85* 2.19*  CALCIUM 9.4 9.3 9.3  PROT  --  6.5  --   ALBUMIN  --  3.3*  --   AST  --  21  --   ALT  --  18  --   ALKPHOS  --  74  --   BILITOT  --  0.6  --   GFRNONAA 24* 26* 21*  GFRAA 28* 30* 25*  ANIONGAP 11 12 10      Hematology Recent Labs  Lab 05/31/19 1315  WBC 7.5  RBC 4.22  HGB 11.7*  HCT 38.8  MCV 91.9  MCH 27.7  MCHC 30.2  RDW 14.2  PLT 182    BNP Recent Labs  Lab 06/02/19 0530  BNP 333.7*  DDimer  Recent Labs  Lab 06/01/19 0128  DDIMER 1.57*     Radiology    DG Chest 2 View  Result Date: 05/31/2019 CLINICAL DATA:  Chest pain EXAM: CHEST - 2 VIEW COMPARISON:  03/14/2019 FINDINGS: There is no focal consolidation. There is no pleural effusion or pneumothorax. The heart and mediastinal contours are unremarkable. There is evidence of prior CABG. There is a cardiac pacemaker present. There is no acute osseous abnormality. IMPRESSION: No active cardiopulmonary disease. Electronically Signed   By: Kathreen Devoid   On: 05/31/2019 13:54   NM Pulmonary Perfusion  Result Date: 06/01/2019 CLINICAL DATA:  Congestive heart failure, short of breath. Chronic hypertension. Morbid PC. EXAM: NUCLEAR MEDICINE PERFUSION LUNG SCAN TECHNIQUE: Perfusion images were obtained in multiple projections after intravenous injection of radiopharmaceutical. RADIOPHARMACEUTICALS:  1.6 mCi Tc-43m MAA COMPARISON:  Radiograph 05/31/2019 FINDINGS: Photopenic defect in the LEFT upper lobe relates to pacemaker on comparison radiograph. There are no wedge-shaped peripheral perfusion defects within LEFT or RIGHT lung to suggest acute pulmonary embolism. IMPRESSION: No  evidence acute or chronic pulmonary embolism. Electronically Signed   By: Suzy Bouchard M.D.   On: 06/01/2019 16:31   DG Shoulder Left  Result Date: 06/01/2019 CLINICAL DATA:  Left shoulder pain EXAM: LEFT SHOULDER - 2+ VIEW COMPARISON:  None. FINDINGS: Moderate degenerative changes in the left AC and glenohumeral joints. A shin no visible fracture. No subluxation or dislocation. IMPRESSION: Moderate degenerative changes.  No acute bony abnormality. Electronically Signed   By: Rolm Baptise M.D.   On: 06/01/2019 01:55   ECHOCARDIOGRAM COMPLETE  Result Date: 06/01/2019    ECHOCARDIOGRAM REPORT   Patient Name:   [name] Date of Exam: 06/01/2019 Medical Rec #:  [id number]         Height:       63.0 in Accession #:    [id number]        Weight:       240.0 lb Date of Birth:  10-Apr-1945          BSA:          2.089 m Patient Age:    74 years          BP:           109/32 mmHg Patient Gender: F                 HR:           65 bpm. Exam Location:  Inpatient Procedure: 2D Echo Indications:    Chest Pain 786.50 / R07.9  History:        Patient has prior history of Echocardiogram examinations, most                 recent 01/18/2019. CHF, CAD, S/P CABG x 5, TIA; Risk                 Factors:Hypertension and Morbid obesity. Chronic respiratory                 failure.  Sonographer:    Vikki Ports Turrentine Referring Phys: [id number] NA LI  Sonographer Comments: Image acquisition challenging due to patient body habitus. Order stated do not administer definity IMPRESSIONS  1. Left ventricular ejection fraction, by estimation, is 50 to 55%. The left ventricle has low normal function. Left ventricular endocardial border not optimally defined to evaluate regional wall motion. Left ventricular diastolic function could not be evaluated.  2. Right ventricular systolic function is normal.  The right ventricular size is normal. Tricuspid regurgitation signal is inadequate for assessing PA pressure.  3. Left atrial size was mild to  moderately dilated.  4. The mitral valve is degenerative. No evidence of mitral valve regurgitation. No evidence of mitral stenosis.  5. The aortic valve is tricuspid. Aortic valve regurgitation is not visualized. Mild aortic valve stenosis. Aortic valve area, by VTI measures 1.78 cm. Aortic valve mean gradient measures 10.2 mmHg. Aortic valve Vmax measures 2.11 m/s.  6. The inferior vena cava is normal in size with greater than 50% respiratory variability, suggesting right atrial pressure of 3 mmHg. Comparison(s): Changes from prior study are noted. EF now low-normal, 50-55%. Very difficult study. FINDINGS  Left Ventricle: Left ventricular ejection fraction, by estimation, is 50 to 55%. The left ventricle has low normal function. Left ventricular endocardial border not optimally defined to evaluate regional wall motion. The left ventricular internal cavity  size was normal in size. There is no left ventricular hypertrophy. Abnormal (paradoxical) septal motion consistent with post-operative status. Left ventricular diastolic function could not be evaluated due to mitral annular calcification (moderate or greater). Left ventricular diastolic function could not be evaluated. Normal left ventricular filling pressure. Right Ventricle: The right ventricular size is normal. No increase in right ventricular wall thickness. Right ventricular systolic function is normal. Tricuspid regurgitation signal is inadequate for assessing PA pressure. Left Atrium: Left atrial size was mild to moderately dilated. Right Atrium: Right atrial size was normal in size. Pericardium: Trivial pericardial effusion is present. Presence of pericardial fat pad. Mitral Valve: The mitral valve is degenerative in appearance. Moderate mitral annular calcification. No evidence of mitral valve regurgitation. No evidence of mitral valve stenosis. Tricuspid Valve: The tricuspid valve is grossly normal. Tricuspid valve regurgitation is trivial. No  evidence of tricuspid stenosis. Aortic Valve: The aortic valve is tricuspid. . There is severe thickening and severe calcifcation of the aortic valve. Aortic valve regurgitation is not visualized. Mild aortic stenosis is present. Moderate aortic valve annular calcification. There is severe thickening of the aortic valve. There is severe calcifcation of the aortic valve. Aortic valve mean gradient measures 10.2 mmHg. Aortic valve peak gradient measures 17.8 mmHg. Aortic valve area, by VTI measures 1.78 cm. Pulmonic Valve: The pulmonic valve was grossly normal. Pulmonic valve regurgitation is not visualized. No evidence of pulmonic stenosis. Aorta: The aortic root is normal in size and structure. Venous: The inferior vena cava is normal in size with greater than 50% respiratory variability, suggesting right atrial pressure of 3 mmHg. IAS/Shunts: The atrial septum is grossly normal. Additional Comments: A pacer wire is visualized in the right atrium and right ventricle.  LEFT VENTRICLE PLAX 2D LVIDd:         4.49 cm  Diastology LVIDs:         2.87 cm  LV e' lateral:   10.50 cm/s LV PW:         0.95 cm  LV E/e' lateral: 8.4 LV IVS:        0.91 cm  LV e' medial:    5.40 cm/s LVOT diam:     2.00 cm  LV E/e' medial:  16.3 LV SV:         72 LV SV Index:   34 LVOT Area:     3.14 cm  RIGHT VENTRICLE RV S prime:     8.10 cm/s LEFT ATRIUM           Index LA diam:  4.90 cm 2.35 cm/m LA Vol (A4C): 88.4 ml 42.31 ml/m  AORTIC VALVE AV Area (Vmax):    1.47 cm AV Area (Vmean):   1.56 cm AV Area (VTI):     1.78 cm AV Vmax:           211.00 cm/s AV Vmean:          142.200 cm/s AV VTI:            0.402 m AV Peak Grad:      17.8 mmHg AV Mean Grad:      10.2 mmHg LVOT Vmax:         98.60 cm/s LVOT Vmean:        70.800 cm/s LVOT VTI:          0.228 m LVOT/AV VTI ratio: 0.57  AORTA Ao Root diam: 3.50 cm MITRAL VALVE MV Area (PHT): 2.48 cm     SHUNTS MV Decel Time: 306 msec     Systemic VTI:  0.23 m MV E velocity: 87.80 cm/s    Systemic Diam: 2.00 cm MV A velocity: 109.00 cm/s MV E/A ratio:  0.81 Eleonore Chiquito MD Electronically signed by Eleonore Chiquito MD Signature Date/Time: 06/01/2019/1:51:16 PM    Final     Cardiac Studies   Echo 06/01/19: 1. Left ventricular ejection fraction, by estimation, is 50 to 55%. The  left ventricle has low normal function. Left ventricular endocardial  border not optimally defined to evaluate regional wall motion. Left  ventricular diastolic function could not be  evaluated.  2. Right ventricular systolic function is normal. The right ventricular  size is normal. Tricuspid regurgitation signal is inadequate for assessing  PA pressure.  3. Left atrial size was mild to moderately dilated.  4. The mitral valve is degenerative. No evidence of mitral valve  regurgitation. No evidence of mitral stenosis.  5. The aortic valve is tricuspid. Aortic valve regurgitation is not  visualized. Mild aortic valve stenosis. Aortic valve area, by VTI measures  1.78 cm. Aortic valve mean gradient measures 10.2 mmHg. Aortic valve Vmax  measures 2.11 m/s.  6. The inferior vena cava is normal in size with greater than 50%  respiratory variability, suggesting right atrial pressure of 3 mmHg.   Comparison(s): Changes from prior study are noted. EF now low-normal,  50-55%. Very difficult study.   Patient Profile     74 y.o. female with a hx of CAD s/p CABG (11/2014: LIMA-LAD, SVG-D1, SVG-OM1-OM2, SVG-PDA), chronic systolic and diastolic heart failure, NICM, CHB s/p ICD, morbid obesity, adrenal insufficiency, DM2, HTN, CKD stage III, gout, OSA and asthma who is being seen for chest pain.  Assessment & Plan    CAD s/p CABG x 4 (2016) - CE remained low and flat, inconsistent with ACS process - pt has not received any further nitro since early yesterday morning - imdur and ranexa increased to help with anginal symptoms - VQ scan negative for PE - recent angiography in Jan 2021 with stable  disease - anti-anginal regimen increased yesterday to 1000 mg ranexa BID and 90 mg imdur - she has had no further chest pain    Chronic systolic and diastolic heart failure CHB ICD in place - BNP mildly elevated in the setting of obesity - echo yesterday normalized EF - continue medical management - paced rhythm - device interrogated in the ER - per rep, no events, no increase in fluid noted by impedence   CKD stage III - sCr 2.19 (1.85) - K 3.7 -  baseline creatinine appears to widely fluctuate between 1.2-2.1   Hyperlipidemia 01/21/2019: Cholesterol, Total 186; HDL 44; LDL Chol Calc (NIH) 99; Triglycerides 254 - continue high dose statin   Obesity - she has lost 27 lbs - congratulated her on this hard work - she is following the [employer] will sign off Medication Recommendations:  As in MAR - ranexa 1000 mg BID, 90 mg imdur Other recommendations (labs, testing, etc):  none Follow up as an outpatient:  I will arrange cardiology follow up with me as a virtual appt next week. Keep f/U appt with Dr. Haroldine Laws in May  For questions or updates, please contact [facility] Please consult [email or web address] for contact info under      Signed, Ledora Bottcher, PA  06/02/2019, 9:10 AM    Patient seen and examined.  Agree with above documentation.  On exam, patient is alert and oriented, regular rate and rhythm, no murmurs, lungs CTAB, no LE edema or JVD appreciated (though difficult to assess given body habitus).  Telemetry personally reviewed, shows sinus rhythm with rate 60-70s.  TTE yesterday technically difficult study, but LVEF appears improved (50 to 55%).  Suspect worsening systolic function in December was due to loss of BiV pacing, but now that has been corrected, her systolic function has improved.  For her chest pain, given unremarkable high-sensitivity troponins despite persistent chest pain, do not suspect an acute coronary syndrome.  D-dimer  is elevated, but no evidence of PE on VQ scan.  Pain does seem responsive to nitroglycerin.  Possible that microvascular disease is etiology of chest pain.  Increased her Imdur and Ranexa.  Currently denies any chest pain.  No further cardiac work-up recommended at this time.  Donato Heinz, MD

## 2019-06-05 DIAGNOSIS — R0789 Other chest pain: Secondary | ICD-10-CM | POA: Diagnosis not present

## 2019-06-06 DIAGNOSIS — R269 Unspecified abnormalities of gait and mobility: Secondary | ICD-10-CM | POA: Diagnosis not present

## 2019-06-06 DIAGNOSIS — R062 Wheezing: Secondary | ICD-10-CM | POA: Diagnosis not present

## 2019-06-06 DIAGNOSIS — J9611 Chronic respiratory failure with hypoxia: Secondary | ICD-10-CM | POA: Diagnosis not present

## 2019-06-06 DIAGNOSIS — I5032 Chronic diastolic (congestive) heart failure: Secondary | ICD-10-CM | POA: Diagnosis not present

## 2019-06-07 DIAGNOSIS — E1122 Type 2 diabetes mellitus with diabetic chronic kidney disease: Secondary | ICD-10-CM | POA: Diagnosis not present

## 2019-06-07 DIAGNOSIS — I2510 Atherosclerotic heart disease of native coronary artery without angina pectoris: Secondary | ICD-10-CM | POA: Diagnosis not present

## 2019-06-07 DIAGNOSIS — N1831 Chronic kidney disease, stage 3a: Secondary | ICD-10-CM | POA: Diagnosis not present

## 2019-06-07 DIAGNOSIS — I509 Heart failure, unspecified: Secondary | ICD-10-CM | POA: Diagnosis not present

## 2019-06-07 DIAGNOSIS — I5032 Chronic diastolic (congestive) heart failure: Secondary | ICD-10-CM | POA: Diagnosis not present

## 2019-06-07 NOTE — Progress Notes (Signed)
Virtual Visit via Telephone Note   This visit type was conducted due to national recommendations for restrictions regarding the COVID-19 Pandemic (e.g. social distancing) in an effort to limit this patient's exposure and mitigate transmission in our community.  Due to her co-morbid illnesses, this patient is at least at moderate risk for complications without adequate follow up.  This format is felt to be most appropriate for this patient at this time.  The patient did not have access to video technology/had technical difficulties with video requiring transitioning to audio format only (telephone).  All issues noted in this document were discussed and addressed.  No physical exam could be performed with this format.  Please refer to the patient's chart for her  consent to telehealth for [facility].   The patient was identified using 2 identifiers.  Date:  06/08/2019   ID:  [name], DOB 11/27/1945, MRN [id number]  Patient Location: Home Provider Location: Other:  hospital  PCP:  [facility]  Cardiologist:  Sanda Klein, MD  Electrophysiologist:  None   Evaluation Performed:  Follow-Up Visit  Chief Complaint:  Chest pain  History of Present Illness:    [name] is a 74 y.o. female with a history of CAD s/p CABG (11/2014: LIMA-LAD, SVG-D1. SVG-OM1-OM2, SVG-PDA), chronic systolic and diastolic heart failure, NICM, CHB s/p ICD, morbid obesity, adrenal insufficiency, DM2, HTN, CKD stage III, gout, OSA, and asthma. Her NICM predated her CAD diagnosis. PPM placed in 2001 for CHB was upgraded to an ICD and her EF normalized with resynchronization therapy (normal EF in 2017). She had CABG in 2016 with resection of her defibrillator lead. Following CABG, she was reprogrammed to VDD due to high atrial pacing thresholds. She had a CHF exacerbation in 01/2019 with echo showing reduced EF to 40-45% and increased left atrial pressure. This coincided with a  loss of left ventricular capture. This was corrected, but she continued to have SOB leading to angiography. She underwent right and left heart cath 02/2019 for dyspnea which revealed stable angiography compared to 2019 study, which includes occluded SVG-OM and patent stents in the native LCx and ramus. Pressures were well-compensated at the time, but mild pulmonary hypertension. Dyspnea felt related to obesity hypoventilation syndrome. She was last seen in clinic 03/2019 and was working on weight loss. He suspected she may have been using metolazone PRN excessively. She was recently admitted to [facility] with intermittent chest pain that started 4 days earlier at church. CP relieved with 4 nitro tablets. D-dimer elevated, VQ scan negative for PE. She had chest pain for four days with only mild and flat CE. Given her recent angiography and CKD, medical management was recommended. Ranexa was titrated to 1000 mg BID and imdur was increased to 90 mg.   She presents for follow up. She feels well from a cardiac perspective, but is having abdominal problems. These problems have been occurring since her lap chole last year. She was unable to eat well this morning.  She also reports constipation even with taking daily miralax. She will call GI for an appt. She had one episode of chest pain while vacuuming. She thinks the increased doses of ranexa and imdur have helped. She is eager to start working with her trainer again. She has lost 30 lbs. She states her PCP decreased her imdur to 60 mg. She wants to watch her chest pain for now and try to resolve her abdominal complaints. I encouraged her to continue  working with her trainer.  The patient does not have symptoms concerning for COVID-19 infection (fever, chills, cough, or new shortness of breath).    Past Medical History:  Diagnosis Date  . AICD (automatic cardioverter/defibrillator) present   . Anemia   . Arthritis   . Asthma   . CAD (coronary artery disease)    a.  10/09/16 LHC: SVG->LAD patent, SVG->Diag patent, SVG->RCA patent, SVG->LCx occluded. EF 60%, b. 10/31/16 LHC DES to AV groove Circ, DES to intermed branch  . Cellulitis and abscess of foot 12/2014   RT FOOT  . CHF (congestive heart failure) ([place])   . Chronic bronchitis ([place])   . Chronic lower back pain   . Diverticulosis   . Facial numbness 02/2016  . Fatty liver disease, nonalcoholic   . Gastritis   . GERD (gastroesophageal reflux disease)   . Gout   . H/O hiatal hernia   . High cholesterol   . Hyperplastic colon polyp   . Hypertension   . IBS (irritable bowel syndrome)   . Internal hemorrhoids   . Ischemic cardiomyopathy   . Obesity   . On home oxygen therapy    "2L at night" (10/31/2016)  . OSA (obstructive sleep apnea)    "can't tolerate a mask" (10/30/2016)  . Pneumonia    "couple times" (10/31/2016)  . Shortness of breath   . TIA (transient ischemic attack)    "recently" (10/31/2016)  . Type II diabetes mellitus ([place])    Past Surgical History:  Procedure Laterality Date  . ABDOMINAL ULTRASOUND  12/01/2011   Peripelvic cysts- #1- 2.4x1.9x2.3cm, #2-1.2x0.9x1.2cm  . ANKLE FRACTURE SURGERY Right    "had rod put in"  . ANTERIOR CERVICAL DECOMP/DISCECTOMY FUSION    . APPENDECTOMY    . BACK SURGERY    . BIOPSY  12/29/2017   Procedure: BIOPSY;  Surgeon: [name], MD;  Location: [facility];  Service: Endoscopy;;  . BIV ICD INSERTION CRT-D  2001?  [name] BIV PACEMAKER GENERATOR CHANGE OUT N/A 11/02/2012   Procedure: BIV PACEMAKER GENERATOR CHANGE OUT;  Surgeon: Sanda Klein, MD;  Location: [place] CATH LAB;  Service: Cardiovascular;  Laterality: N/A;  . CARDIAC CATHETERIZATION  05/17/1999   No significant coronary obstructive disease w/ mild 20% luminal irregularity of the first diag branch of the LAD  . CARDIAC CATHETERIZATION  07/08/2002   No significant CAD, moderately depressed LV systolic function  . CARDIAC CATHETERIZATION Bilateral 04/26/2007   Normal findings,  recommend medical therapy  . CARDIAC CATHETERIZATION  02/18/2008   Moderate CAD, would benefit from having a functional study, recommend continue medical therapy  . CARDIAC CATHETERIZATION  07/23/2012   Medical therapy  . CARDIAC CATHETERIZATION N/A 11/24/2014   Procedure: Left Heart Cath and Coronary Angiography;  Surgeon: Troy Sine, MD;  Location: [place] CV LAB;  Service: Cardiovascular;  Laterality: N/A;  . CARDIAC CATHETERIZATION  11/27/2014   Procedure: Intravascular Pressure Wire/FFR Study;  Surgeon: Peter M Martinique, MD;  Location: [place] CV LAB;  Service: Cardiovascular;;  . CARDIAC CATHETERIZATION  10/09/2016  . CHOLECYSTECTOMY N/A 04/09/2018   Procedure: LAPAROSCOPIC CHOLECYSTECTOMY;  Surgeon: Greer Pickerel, MD;  Location: [facility];  Service: General;  Laterality: N/A;  . COLONOSCOPY WITH PROPOFOL N/A 12/29/2017   Procedure: COLONOSCOPY WITH PROPOFOL;  Surgeon: [name], MD;  [facility];  Service: Endoscopy;  Laterality: N/A;  . CORONARY ANGIOGRAM  2010  . CORONARY ARTERY BYPASS GRAFT N/A 11/29/2014   Procedure: CORONARY ARTERY BYPASS GRAFTING x  5 (LIMA-LAD, SVG-D, SVG-OM1-OM2, SVG-PD);  Surgeon: Melrose Nakayama, MD;  Location: [place];  Service: Open Heart Surgery;  Laterality: N/A;  . CORONARY STENT INTERVENTION N/A 10/31/2016   Procedure: CORONARY STENT INTERVENTION;  Surgeon: Burnell Blanks, MD;  Location: [place] CV LAB;  Service: Cardiovascular;  Laterality: N/A;  . ESOPHAGOGASTRODUODENOSCOPY (EGD) WITH PROPOFOL N/A 12/29/2017   Procedure: ESOPHAGOGASTRODUODENOSCOPY (EGD) WITH PROPOFOL;  Surgeon: [name], MD;  [facility];  Service: Endoscopy;  Laterality: N/A;  . FRACTURE SURGERY    . INSERT / REPLACE / [place]  . KNEE ARTHROSCOPY Bilateral   . LEFT HEART CATH AND CORS/GRAFTS ANGIOGRAPHY N/A 12/09/2017   Procedure: LEFT HEART CATH AND CORS/GRAFTS ANGIOGRAPHY;  Surgeon: Troy Sine, MD;   Location: [place] CV LAB;  Service: Cardiovascular;  Laterality: N/A;  . LEFT HEART CATHETERIZATION WITH CORONARY ANGIOGRAM N/A 07/23/2012   Procedure: LEFT HEART CATHETERIZATION WITH CORONARY ANGIOGRAM;  Surgeon: Leonie Man, MD;  Location: [facility] CATH LAB;  Service: Cardiovascular;  Laterality: N/A;  Carlton Adam MYOVIEW  11/14/2011   Mild-moderate defect seen in Mid Inferolateral and Mid Anterolateral regions-consistant w/ infarct/scar. No significant ischemia demonstrated.  [name] POLYPECTOMY  12/29/2017   Procedure: POLYPECTOMY;  Surgeon: [name], MD;  [facility];  Service: Endoscopy;;  . RIGHT/LEFT HEART CATH AND CORONARY ANGIOGRAPHY N/A 10/09/2016   Procedure: RIGHT/LEFT HEART CATH AND CORONARY ANGIOGRAPHY;  Surgeon: Jolaine Artist, MD;  Location: [place] CV LAB;  Service: Cardiovascular;  Laterality: N/A;  . RIGHT/LEFT HEART CATH AND CORONARY/GRAFT ANGIOGRAPHY N/A 03/11/2019   Procedure: RIGHT/LEFT HEART CATH AND CORONARY/GRAFT ANGIOGRAPHY;  Surgeon: Jolaine Artist, MD;  Location: [place] CV LAB;  Service: Cardiovascular;  Laterality: N/A;  . TEE WITHOUT CARDIOVERSION N/A 11/29/2014   Procedure: TRANSESOPHAGEAL ECHOCARDIOGRAM (TEE);  Surgeon: Melrose Nakayama, MD;  Location: [place];  Service: Open Heart Surgery;  Laterality: N/A;  . TRANSTHORACIC ECHOCARDIOGRAM  07/23/2012   EF 55-60%, normal-mild  . TUBAL LIGATION       Current Meds  Medication Sig  . acetaminophen (TYLENOL) 500 MG tablet Take 500-1,000 mg by mouth every 12 (twelve) hours as needed (for pain or headaches).   [name] albuterol (PROVENTIL HFA;VENTOLIN HFA) 108 (90 Base) MCG/ACT inhaler Inhale 2 puffs into the lungs every 6 (six) hours as needed for wheezing or shortness of breath.  [name] albuterol (PROVENTIL) (2.5 MG/3ML) 0.083% nebulizer solution Take 3 mLs (2.5 mg total) by nebulization every 6 (six) hours as needed for wheezing or shortness of breath.  [name] aspirin EC 81 MG tablet Take 1  tablet (81 mg total) by mouth daily.  . Biotin 1000 MCG tablet Take 1,000 mcg by mouth daily.  . bisoprolol (ZEBETA) 5 MG tablet TAKE 1 TABLET EVERY DAY (Patient taking differently: Take 5 mg by mouth daily. )  . budesonide-formoterol (SYMBICORT) 160-4.5 MCG/ACT inhaler Inhale 2 puffs into the lungs 2 (two) times daily.  . cetirizine (ZYRTEC) 10 MG tablet Take 10 mg by mouth daily.   . clopidogrel (PLAVIX) 75 MG tablet TAKE 1 TABLET EVERY DAY (Patient taking differently: Take 75 mg by mouth at bedtime. )  . clotrimazole (LOTRIMIN) 1 % cream Apply 1 application topically 2 (two) times daily as needed (rash).  . clotrimazole (MYCELEX) 10 MG troche Take 10 mg by mouth daily as needed (Sore Mouth/thrush).   . colchicine 0.6 MG tablet Take 0.6 mg by mouth 2 (two) times daily as needed (for gout flares).  [name]  cycloSPORINE (RESTASIS) 0.05 % ophthalmic emulsion Place 1 drop into both eyes 2 (two) times daily.  . diclofenac sodium (VOLTAREN) 1 % GEL Apply 2 g topically 4 (four) times daily. (Patient taking differently: Apply 2 g topically 4 (four) times daily as needed (pain.). )  . EPINEPHrine (EPIPEN 2-PAK) 0.3 mg/0.3 mL IJ SOAJ injection Inject 0.3 mg into the muscle once as needed (severe allergic reaction).  . febuxostat (ULORIC) 40 MG tablet Take 40 mg by mouth daily.  [name] guaiFENesin (MUCINEX) 600 MG 12 hr tablet Take 600 mg by mouth at bedtime as needed for cough or to loosen phlegm.   . hydrocortisone 2.5 % cream Apply 1 application topically daily as needed (itching).   . hydroxypropyl methylcellulose / hypromellose (ISOPTO TEARS / GONIOVISC) 2.5 % ophthalmic solution Place 1 drop into both eyes 3 (three) times daily.   [name] ipratropium (ATROVENT) 0.02 % nebulizer solution Take 2.5 mLs (0.5 mg total) by nebulization 4 (four) times daily. (Patient taking differently: Take 0.5 mg by nebulization every 6 (six) hours as needed for wheezing or shortness of breath. )  . isosorbide mononitrate (IMDUR) 30 MG 24  hr tablet Take 3 tablets (90 mg total) by mouth daily. (Patient taking differently: Take 60 mg by mouth daily. )  . metolazone (ZAROXOLYN) 2.5 MG tablet Take 1 tablet (2.5 mg total) by mouth daily as needed (for weight gain greater than 3 pounds). Take one tablet on Monday and one tablet on Thursday. (Patient taking differently: Take 2.5 mg by mouth daily as needed (for an overnight weight gain of greater than 3 pounds). )  . nitroGLYCERIN (NITROSTAT) 0.4 MG SL tablet Place 1 tablet (0.4 mg total) under the tongue every 5 (five) minutes as needed for chest pain.  [name] ondansetron (ZOFRAN ODT) 4 MG disintegrating tablet Take 1 tablet (4 mg total) by mouth every 8 (eight) hours as needed for nausea or vomiting. (Patient taking differently: Take 4 mg by mouth every 8 (eight) hours as needed for nausea or vomiting (DISSOLVE IN THE MOUTH). )  . OXYGEN Inhale 3 L/min into the lungs continuous.   . polyethylene glycol (MIRALAX / GLYCOLAX) 17 g packet Take 17 g by mouth in the morning.   . potassium chloride SA (KLOR-CON) 20 MEQ tablet Take 1 tablet (20 mEq total) by mouth 2 (two) times daily. (Patient taking differently: Take 20 mEq by mouth daily. )  . predniSONE (DELTASONE) 10 MG tablet Take 10 mg by mouth daily as needed (as directed for gout flares).   . ranolazine (RANEXA) 1000 MG SR tablet Take 1 tablet (1,000 mg total) by mouth 2 (two) times daily.  . rosuvastatin (CRESTOR) 20 MG tablet TAKE 1 TABLET AT BEDTIME (Patient taking differently: Take 20 mg by mouth at bedtime. )  . saxagliptin HCl (ONGLYZA) 2.5 MG TABS tablet Take 2.5 mg by mouth daily.   [name] spironolactone (ALDACTONE) 25 MG tablet Take 25 mg by mouth at bedtime.   . torsemide (DEMADEX) 20 MG tablet Take 3 tablets (60 mg total) by mouth daily. (Patient taking differently: Take 20-40 mg by mouth See admin instructions. Take 40 mg by mouth in the morning and 20 mg in the afternoon)  . triamcinolone cream (KENALOG) 0.1 % Apply 1 application topically  2 (two) times daily as needed (itching).      Allergies:   Ivp dye [iodinated diagnostic agents], Shellfish allergy, Shellfish-derived products, Sulfa antibiotics, Iodine, Atorvastatin, Benadryl [diphenhydramine], Doxycycline, Cephalexin, and Zithromax [azithromycin]   Social History  Tobacco Use  . Smoking status: Former Smoker    Packs/day: 0.25    Years: 3.00    Pack years: 0.75    Types: Cigarettes    Quit date: 02/11/1968    Years since quitting: 51.3  . Smokeless tobacco: Never Used  Substance Use Topics  . Alcohol use: No  . Drug use: No     Family Hx: The patient's family history includes Asthma in her sister; Breast cancer in her mother; Diabetes in her maternal grandmother and mother; Glaucoma in her maternal aunt; Heart disease in her maternal aunt and maternal grandmother; Kidney disease in her maternal grandmother. There is no history of Colon cancer, Stomach cancer, or Pancreatic cancer.  ROS:   Please see the history of present illness.     All other systems reviewed and are negative.   Prior CV studies:   The following studies were reviewed today:  Echo 06/01/2019: 1. Left ventricular ejection fraction, by estimation, is 50 to 55%. The  left ventricle has low normal function. Left ventricular endocardial  border not optimally defined to evaluate regional wall motion. Left  ventricular diastolic function could not be  evaluated.  2. Right ventricular systolic function is normal. The right ventricular  size is normal. Tricuspid regurgitation signal is inadequate for assessing  PA pressure.  3. Left atrial size was mild to moderately dilated.  4. The mitral valve is degenerative. No evidence of mitral valve  regurgitation. No evidence of mitral stenosis.  5. The aortic valve is tricuspid. Aortic valve regurgitation is not  visualized. Mild aortic valve stenosis. Aortic valve area, by VTI measures  1.78 cm. Aortic valve mean gradient measures 10.2 mmHg.  Aortic valve Vmax  measures 2.11 m/s.  6. The inferior vena cava is normal in size with greater than 50%  respiratory variability, suggesting right atrial pressure of 3 mmHg.   Labs/Other Tests and Data Reviewed:    EKG:  No ECG reviewed.  Recent Labs: 01/21/2019: NT-Pro BNP 929 05/31/2019: Hemoglobin 11.7; Platelets 182 06/01/2019: ALT 18; TSH 2.733 06/02/2019: B Natriuretic Peptide 333.7; BUN 51; Creatinine, Ser 2.19; Magnesium 2.1; Potassium 3.7; Sodium 138   Recent Lipid Panel Lab Results  Component Value Date/Time   CHOL 186 01/21/2019 10:45 AM   TRIG 254 (H) 01/21/2019 10:45 AM   HDL 44 01/21/2019 10:45 AM   CHOLHDL 4.2 01/21/2019 10:45 AM   CHOLHDL 4.3 10/31/2016 03:14 AM   LDLCALC 99 01/21/2019 10:45 AM    Wt Readings from Last 3 Encounters:  06/08/19 240 lb (108.9 kg)  06/02/19 242 lb 8 oz (110 kg)  03/28/19 253 lb (114.8 kg)     Objective:    Vital Signs:  BP 126/68   Pulse 74   Ht 5\' 3"  (1.6 m)   Wt 240 lb (108.9 kg)   SpO2 97%   BMI 42.51 kg/m    VITAL SIGNS:  reviewed GEN:  no acute distress RESPIRATORY:  respirations unlabored NEURO:  alert and oriented x 3, no obvious focal deficit PSYCH:  normal affect  ASSESSMENT & PLAN:    CAD s/p CABG x 4 (2016) Chest pain - angiography 02/2019 with stable disease - anti-anginals titrated without recurrence in chest pain while hospitalized - VQ scan negative for PE - CE low and flat, felt inconsistent with ACS  - will follow her chest pain closely - recommended continuing the 90 mg imdur as her pressure tolerates - continue ASA, plavix, BB, statin   Chronic systolic and diastolic  heart failure CHB BiV PPM in place - no events on interrogation at recent hospitalization - She has an appt with AHF clinic next month - she denies symptoms of volume overload today - continue present medications (torsemide and spiro)   CKD stage III - discharge sCr  - no medication changes   Obesity - she is working  with a trainer once per week on exercises and has lost 30 lbs - congratulated her - she feels like she can move better and has not been using a wheelchair for doctor appts    COVID-19 Education: The signs and symptoms of COVID-19 were discussed with the patient and how to seek care for testing (follow up with PCP or arrange E-visit).  The importance of social distancing was discussed today.  Time:   Today, I have spent 16 minutes with the patient with telehealth technology discussing the above problems.     Medication Adjustments/Labs and Tests Ordered: Current medicines are reviewed at length with the patient today.  Concerns regarding medicines are outlined above.   Tests Ordered: No orders of the defined types were placed in this encounter.   Medication Changes: No orders of the defined types were placed in this encounter.   Follow Up:  In Person in 2 month(s)  Signed, Ledora Bottcher, [place]  06/08/2019 11:18 AM    [place]

## 2019-06-08 ENCOUNTER — Telehealth (INDEPENDENT_AMBULATORY_CARE_PROVIDER_SITE_OTHER): Payer: Medicare HMO | Admitting: Physician Assistant

## 2019-06-08 ENCOUNTER — Encounter: Payer: Self-pay | Admitting: Physician Assistant

## 2019-06-08 VITALS — BP 126/68 | HR 74 | Ht 63.0 in | Wt 240.0 lb

## 2019-06-08 DIAGNOSIS — I5042 Chronic combined systolic (congestive) and diastolic (congestive) heart failure: Secondary | ICD-10-CM | POA: Diagnosis not present

## 2019-06-08 DIAGNOSIS — Z950 Presence of cardiac pacemaker: Secondary | ICD-10-CM

## 2019-06-08 DIAGNOSIS — I442 Atrioventricular block, complete: Secondary | ICD-10-CM

## 2019-06-08 DIAGNOSIS — N1831 Chronic kidney disease, stage 3a: Secondary | ICD-10-CM

## 2019-06-08 DIAGNOSIS — I2510 Atherosclerotic heart disease of native coronary artery without angina pectoris: Secondary | ICD-10-CM | POA: Diagnosis not present

## 2019-06-08 NOTE — Patient Instructions (Signed)
Medication Instructions:  Your physician recommends that you continue on your current medications as directed. Please refer to the Current Medication list given to you today.  *If you need a refill on your cardiac medications before your next appointment, please call your pharmacy*   Follow-Up: At [facility], you and your health needs are our priority.  As part of our continuing mission to provide you with exceptional heart care, we have created designated Provider Care Teams.  These Care Teams include your primary Cardiologist (physician) and Advanced Practice Providers (APPs -  Physician Assistants and Nurse Practitioners) who all work together to provide you with the care you need, when you need it.  We recommend signing up for the patient portal called "MyChart".  Sign up information is provided on this After Visit Summary.  MyChart is used to connect with patients for Virtual Visits (Telemedicine).  Patients are able to view lab/test results, encounter notes, upcoming appointments, etc.  Non-urgent messages can be sent to your provider as well.   To learn more about what you can do with MyChart, go to [email or web address].    Your next appointment:   2 month(s)  The format for your next appointment:   In Person  Provider:   Sanda Klein, MD   Other Instructions Our office will contact you to schedule your appointment.

## 2019-06-24 DIAGNOSIS — I2510 Atherosclerotic heart disease of native coronary artery without angina pectoris: Secondary | ICD-10-CM | POA: Diagnosis not present

## 2019-06-24 DIAGNOSIS — I5032 Chronic diastolic (congestive) heart failure: Secondary | ICD-10-CM | POA: Diagnosis not present

## 2019-06-24 DIAGNOSIS — E1169 Type 2 diabetes mellitus with other specified complication: Secondary | ICD-10-CM | POA: Diagnosis not present

## 2019-06-24 DIAGNOSIS — I10 Essential (primary) hypertension: Secondary | ICD-10-CM | POA: Diagnosis not present

## 2019-06-24 DIAGNOSIS — J4520 Mild intermittent asthma, uncomplicated: Secondary | ICD-10-CM | POA: Diagnosis not present

## 2019-06-24 DIAGNOSIS — I5033 Acute on chronic diastolic (congestive) heart failure: Secondary | ICD-10-CM | POA: Diagnosis not present

## 2019-06-24 DIAGNOSIS — N183 Chronic kidney disease, stage 3 unspecified: Secondary | ICD-10-CM | POA: Diagnosis not present

## 2019-06-24 DIAGNOSIS — E1122 Type 2 diabetes mellitus with diabetic chronic kidney disease: Secondary | ICD-10-CM | POA: Diagnosis not present

## 2019-06-24 DIAGNOSIS — E7800 Pure hypercholesterolemia, unspecified: Secondary | ICD-10-CM | POA: Diagnosis not present

## 2019-06-27 ENCOUNTER — Telehealth (HOSPITAL_COMMUNITY): Payer: Self-pay | Admitting: *Deleted

## 2019-06-27 NOTE — Telephone Encounter (Signed)
Pt left VM requesting a return call because she is having problems with her oxygen. I called pt no answer/left VM for her to return my call.

## 2019-06-29 NOTE — Telephone Encounter (Signed)
Pt returned call stating her portable oxygen tank doesn't work and [employer] said they dont know when she can get a new one because they are back logged. [employer] office is now in [place] and pt said that is too far for her to travel to. Pt would like to receive her oxygen from another company. Pt said she has an office visit tomorrow and would like to discuss further during appt.

## 2019-06-30 ENCOUNTER — Ambulatory Visit (HOSPITAL_COMMUNITY)
Admission: RE | Admit: 2019-06-30 | Discharge: 2019-06-30 | Disposition: A | Discharge status: Home / Self Care | Payer: Medicare HMO | Source: Ambulatory Visit | Attending: Internal Medicine | Admitting: Internal Medicine

## 2019-06-30 ENCOUNTER — Other Ambulatory Visit: Payer: Self-pay

## 2019-06-30 ENCOUNTER — Encounter (HOSPITAL_COMMUNITY): Payer: Self-pay | Admitting: Internal Medicine

## 2019-06-30 VITALS — BP 101/62 | HR 72 | Wt 244.0 lb

## 2019-06-30 DIAGNOSIS — Z8673 Personal history of transient ischemic attack (TIA), and cerebral infarction without residual deficits: Secondary | ICD-10-CM | POA: Diagnosis not present

## 2019-06-30 DIAGNOSIS — Z7902 Long term (current) use of antithrombotics/antiplatelets: Secondary | ICD-10-CM | POA: Diagnosis not present

## 2019-06-30 DIAGNOSIS — Z9981 Dependence on supplemental oxygen: Secondary | ICD-10-CM | POA: Diagnosis not present

## 2019-06-30 DIAGNOSIS — I5022 Chronic systolic (congestive) heart failure: Secondary | ICD-10-CM | POA: Diagnosis not present

## 2019-06-30 DIAGNOSIS — Z803 Family history of malignant neoplasm of breast: Secondary | ICD-10-CM | POA: Insufficient documentation

## 2019-06-30 DIAGNOSIS — K589 Irritable bowel syndrome without diarrhea: Secondary | ICD-10-CM | POA: Diagnosis not present

## 2019-06-30 DIAGNOSIS — Z7982 Long term (current) use of aspirin: Secondary | ICD-10-CM | POA: Diagnosis not present

## 2019-06-30 DIAGNOSIS — J9611 Chronic respiratory failure with hypoxia: Secondary | ICD-10-CM

## 2019-06-30 DIAGNOSIS — J9612 Chronic respiratory failure with hypercapnia: Secondary | ICD-10-CM

## 2019-06-30 DIAGNOSIS — Z955 Presence of coronary angioplasty implant and graft: Secondary | ICD-10-CM | POA: Insufficient documentation

## 2019-06-30 DIAGNOSIS — Z79899 Other long term (current) drug therapy: Secondary | ICD-10-CM | POA: Insufficient documentation

## 2019-06-30 DIAGNOSIS — M1990 Unspecified osteoarthritis, unspecified site: Secondary | ICD-10-CM | POA: Diagnosis not present

## 2019-06-30 DIAGNOSIS — E1122 Type 2 diabetes mellitus with diabetic chronic kidney disease: Secondary | ICD-10-CM | POA: Insufficient documentation

## 2019-06-30 DIAGNOSIS — Z87891 Personal history of nicotine dependence: Secondary | ICD-10-CM | POA: Insufficient documentation

## 2019-06-30 DIAGNOSIS — Z825 Family history of asthma and other chronic lower respiratory diseases: Secondary | ICD-10-CM | POA: Insufficient documentation

## 2019-06-30 DIAGNOSIS — I130 Hypertensive heart and chronic kidney disease with heart failure and stage 1 through stage 4 chronic kidney disease, or unspecified chronic kidney disease: Secondary | ICD-10-CM | POA: Insufficient documentation

## 2019-06-30 DIAGNOSIS — Z7984 Long term (current) use of oral hypoglycemic drugs: Secondary | ICD-10-CM | POA: Diagnosis not present

## 2019-06-30 DIAGNOSIS — I2510 Atherosclerotic heart disease of native coronary artery without angina pectoris: Secondary | ICD-10-CM | POA: Insufficient documentation

## 2019-06-30 DIAGNOSIS — Z951 Presence of aortocoronary bypass graft: Secondary | ICD-10-CM | POA: Insufficient documentation

## 2019-06-30 DIAGNOSIS — G4733 Obstructive sleep apnea (adult) (pediatric): Secondary | ICD-10-CM | POA: Diagnosis not present

## 2019-06-30 DIAGNOSIS — Z881 Allergy status to other antibiotic agents status: Secondary | ICD-10-CM | POA: Insufficient documentation

## 2019-06-30 DIAGNOSIS — Z882 Allergy status to sulfonamides status: Secondary | ICD-10-CM | POA: Insufficient documentation

## 2019-06-30 DIAGNOSIS — I5042 Chronic combined systolic (congestive) and diastolic (congestive) heart failure: Secondary | ICD-10-CM | POA: Diagnosis not present

## 2019-06-30 DIAGNOSIS — Z95810 Presence of automatic (implantable) cardiac defibrillator: Secondary | ICD-10-CM | POA: Diagnosis not present

## 2019-06-30 DIAGNOSIS — N1831 Chronic kidney disease, stage 3a: Secondary | ICD-10-CM | POA: Diagnosis not present

## 2019-06-30 DIAGNOSIS — Z91041 Radiographic dye allergy status: Secondary | ICD-10-CM | POA: Insufficient documentation

## 2019-06-30 DIAGNOSIS — M109 Gout, unspecified: Secondary | ICD-10-CM | POA: Diagnosis not present

## 2019-06-30 DIAGNOSIS — J9610 Chronic respiratory failure, unspecified whether with hypoxia or hypercapnia: Secondary | ICD-10-CM | POA: Diagnosis not present

## 2019-06-30 DIAGNOSIS — Z833 Family history of diabetes mellitus: Secondary | ICD-10-CM | POA: Insufficient documentation

## 2019-06-30 DIAGNOSIS — Z8249 Family history of ischemic heart disease and other diseases of the circulatory system: Secondary | ICD-10-CM | POA: Insufficient documentation

## 2019-06-30 DIAGNOSIS — K219 Gastro-esophageal reflux disease without esophagitis: Secondary | ICD-10-CM | POA: Diagnosis not present

## 2019-06-30 DIAGNOSIS — Z7951 Long term (current) use of inhaled steroids: Secondary | ICD-10-CM | POA: Insufficient documentation

## 2019-06-30 DIAGNOSIS — Z6841 Body Mass Index (BMI) 40.0 and over, adult: Secondary | ICD-10-CM | POA: Insufficient documentation

## 2019-06-30 DIAGNOSIS — Z91013 Allergy to seafood: Secondary | ICD-10-CM | POA: Insufficient documentation

## 2019-06-30 DIAGNOSIS — N184 Chronic kidney disease, stage 4 (severe): Secondary | ICD-10-CM | POA: Insufficient documentation

## 2019-06-30 DIAGNOSIS — Z888 Allergy status to other drugs, medicaments and biological substances status: Secondary | ICD-10-CM | POA: Insufficient documentation

## 2019-06-30 MED ORDER — JARDIANCE 10 MG PO TABS: 10.0000 mg | ORAL_TABLET | Freq: Every day | ORAL | 3 refills | Status: DC

## 2019-06-30 NOTE — Patient Instructions (Signed)
Stop Onglyza  Start Jardiance 10 mg daily  Please call our office in November to schedule your follow up appointment  If you have any questions or concerns before your next appointment please send [place] a message through [place] or call our office at [phone].  At the [facility], you and your health needs are our priority. As part of our continuing mission to provide you with exceptional heart care, we have created designated Provider Care Teams. These Care Teams include your primary Cardiologist (physician) and Advanced Practice Providers (APPs- Physician Assistants and Nurse Practitioners) who all work together to provide you with the care you need, when you need it.   You may see any of the following providers on your designated Care Team at your next follow up: [name] Dr Glori Bickers . Dr Loralie Champagne . Darrick Grinder, NP . Lyda Jester, PA . Audry Riles, PharmD   Please be sure to bring in all your medications bottles to every appointment.

## 2019-06-30 NOTE — Progress Notes (Signed)
ADVANCED HF CLINIC NOTE  Patient ID: [name], female   DOB: 1945-09-09, 74 y.o.   MRN: [id number]  Primary Cardiologist: Dr. Dani Gobble Croitoru Primary HF: Dr. Haroldine Laws   HPI: [name] is a 74 year old with history of morbid obesity, DM, HTN, asthma, OSA, ICD, GERD, arthritis, CRT-D 2008 [employer], CAD, S/P CABG x5 11/29/2014.  Discharged from [facility] 12/07/14 s/p CABG. Discharge weight 252.  Went to [facility].  Readmitted 12/21/14 with RLE cellulitis and volume overload with weight of 273 and complaints of dyspnea and orthopnea. RLE cellulitis treated with IV ABX. She was had brisk diuresis with lasix gtt. Diuresis slowed down with slight Cr bump. Acetazolamide added and monitored closely with history of sulfa allergy. Diuresed well, but lasix held with continued Cr bump. Transitioned to po torsemide for d/c. Discharge weight 246 lbs.  Admitted 1/23 through 03/09/16 with CP. CEs negative.   Alicia. [name] returns today for HF follow up. Here with her son. Since we last saw her in January, has become much more active. Working with Chief of Staff and a Clinical research associate. Has lost 22 pounds. Feeling much better. Able to walk halls much easier. No edema. Still with occasional chest twinges. 4/21 admitted with atypical CP. Trop flat echo with EF 50-55%  Managed medically with creatinine > 2.0   Studies:  Cath 10/09/16 1. Severe 3v CAD 2. LIMA  to LAD widely patent 3. SVG to Diagonal widely patent 4. SVG to RCA widely patent 5. SVG to LCx and OM occluded ostially.  Underwent to stent to LCX and Ramus 6. LVEF 60% 7. Mild PAH with normal PCWP and output  Underwent PCI 10/31/16:  -Successful PTCA/DES x 3 AV groove circumflex -Successful PTCA/DES x 1 intermediate branch   Findings:  Ao = 117/61 (83) LV = 126/8 RA = 11 RV = 39/10 PA = 37/16 (25) PCW = 13 Fick cardiac output/index = 5.1/2.3 PVR = 2.4 WU FA sat = 95% PA sat = 65%  Assessment: 1. Severe 3v CAD 2. LIMA  to LAD  widely patent 3. SVG to Diagonal widely patent 4. SVG to RCA widely patent 5. SVG to LCx and OM occluded ostially 6. LVEF 60% 7. Mild PAH with normal PCWP and output    Echo 11/24/14 EF 55-60%, Grade 1 DD. Mild TR TEE 11/29/14 EF 55-60%, LVH, Mild AR and MR Echo 9/17: EF 55-60% LVH  Labs 02/21/2015: K 4.5 Creatinine 1.39  Labs 07/12/2015: K 4.4 Creatinine 1.14 Hgb 11.5   Past Medical History:  Diagnosis Date  . AICD (automatic cardioverter/defibrillator) present   . Anemia   . Arthritis   . Asthma   . CAD (coronary artery disease)    a. 10/09/16 LHC: SVG->LAD patent, SVG->Diag patent, SVG->RCA patent, SVG->LCx occluded. EF 60%, b. 10/31/16 LHC DES to AV groove Circ, DES to intermed branch  . Cellulitis and abscess of foot 12/2014   RT FOOT  . CHF (congestive heart failure) ([place])   . Chronic bronchitis ([place])   . Chronic lower back pain   . Diverticulosis   . Facial numbness 02/2016  . Fatty liver disease, nonalcoholic   . Gastritis   . GERD (gastroesophageal reflux disease)   . Gout   . H/O hiatal hernia   . High cholesterol   . Hyperplastic colon polyp   . Hypertension   . IBS (irritable bowel syndrome)   . Internal hemorrhoids   . Ischemic cardiomyopathy   . Obesity   . On home oxygen  therapy    "2L at night" (10/31/2016)  . OSA (obstructive sleep apnea)    "can't tolerate a mask" (10/30/2016)  . Pneumonia    "couple times" (10/31/2016)  . Shortness of breath   . TIA (transient ischemic attack)    "recently" (10/31/2016)  . Type II diabetes mellitus ([place])     Current Outpatient Medications  Medication Sig Dispense Refill  . acetaminophen (TYLENOL) 500 MG tablet Take 500-1,000 mg by mouth every 12 (twelve) hours as needed (for pain or headaches).     [name] albuterol (PROVENTIL HFA;VENTOLIN HFA) 108 (90 Base) MCG/ACT inhaler Inhale 2 puffs into the lungs every 6 (six) hours as needed for wheezing or shortness of breath. 1 Inhaler 0  . albuterol (PROVENTIL) (2.5 MG/3ML)  0.083% nebulizer solution Take 3 mLs (2.5 mg total) by nebulization every 6 (six) hours as needed for wheezing or shortness of breath. 75 mL 0  . aspirin EC 81 MG tablet Take 1 tablet (81 mg total) by mouth daily. 90 tablet 3  . Biotin 1000 MCG tablet Take 1,000 mcg by mouth daily.    . bisoprolol (ZEBETA) 5 MG tablet Take 5 mg by mouth daily.    . budesonide-formoterol (SYMBICORT) 160-4.5 MCG/ACT inhaler Inhale 2 puffs into the lungs 2 (two) times daily.    . cetirizine (ZYRTEC) 10 MG tablet Take 10 mg by mouth daily.     . clopidogrel (PLAVIX) 75 MG tablet Take 75 mg by mouth daily.    . clotrimazole (LOTRIMIN) 1 % cream Apply 1 application topically 2 (two) times daily as needed (rash).    . clotrimazole (MYCELEX) 10 MG troche Take 10 mg by mouth daily as needed (Sore Mouth/thrush).     . colchicine 0.6 MG tablet Take 0.6 mg by mouth 2 (two) times daily as needed (for gout flares).    . cycloSPORINE (RESTASIS) 0.05 % ophthalmic emulsion Place 1 drop into both eyes 2 (two) times daily.    . diclofenac sodium (VOLTAREN) 1 % GEL Apply 2 g topically 4 (four) times daily. (Patient taking differently: Apply 2 g topically 4 (four) times daily as needed (pain.). ) 1 Tube 0  . EPINEPHrine (EPIPEN 2-PAK) 0.3 mg/0.3 mL IJ SOAJ injection Inject 0.3 mg into the muscle once as needed (severe allergic reaction).    . febuxostat (ULORIC) 40 MG tablet Take 40 mg by mouth daily.  3  . guaiFENesin (MUCINEX) 600 MG 12 hr tablet Take 600 mg by mouth at bedtime as needed for cough or to loosen phlegm.     . hydrocortisone 2.5 % cream Apply 1 application topically daily as needed (itching).   1  . hydroxypropyl methylcellulose / hypromellose (ISOPTO TEARS / GONIOVISC) 2.5 % ophthalmic solution Place 1 drop into both eyes 3 (three) times daily.     [name] ipratropium (ATROVENT) 0.02 % nebulizer solution Take 2.5 mLs (0.5 mg total) by nebulization 4 (four) times daily. (Patient taking differently: Take 0.5 mg by nebulization  every 6 (six) hours as needed for wheezing or shortness of breath. ) 75 mL 0  . isosorbide mononitrate (IMDUR) 30 MG 24 hr tablet Take 90 mg by mouth daily.    . metolazone (ZAROXOLYN) 2.5 MG tablet Take 2.5 mg by mouth 2 (two) times a week. Monday and Thursday    . nitroGLYCERIN (NITROSTAT) 0.4 MG SL tablet Place 1 tablet (0.4 mg total) under the tongue every 5 (five) minutes as needed for chest pain. 25 tablet 3  . ondansetron (ZOFRAN-ODT)  4 MG disintegrating tablet Take 4 mg by mouth every 8 (eight) hours as needed for nausea or vomiting.    . OXYGEN Inhale 3 L/min into the lungs continuous.     . polyethylene glycol (MIRALAX / GLYCOLAX) 17 g packet Take 17 g by mouth in the morning.     . potassium chloride SA (KLOR-CON) 20 MEQ tablet Take 20 mEq by mouth 2 (two) times daily.    . predniSONE (DELTASONE) 10 MG tablet Take 10 mg by mouth daily as needed (as directed for gout flares).     . ranolazine (RANEXA) 1000 MG SR tablet Take 1 tablet (1,000 mg total) by mouth 2 (two) times daily. 60 tablet 2  . rosuvastatin (CRESTOR) 20 MG tablet Take 20 mg by mouth daily.    . saxagliptin HCl (ONGLYZA) 2.5 MG TABS tablet Take 2.5 mg by mouth daily.     [name] spironolactone (ALDACTONE) 25 MG tablet Take 25 mg by mouth at bedtime.     . torsemide (DEMADEX) 20 MG tablet Take 60 mg by mouth daily.    [name] triamcinolone cream (KENALOG) 0.1 % Apply 1 application topically 2 (two) times daily as needed (itching).      No current facility-administered medications for this encounter.    Allergies  Allergen Reactions  . Ivp Dye [Iodinated Diagnostic Agents] Shortness Of Breath    No reaction to PO contrast with non-ionic dye.06-25-2014/rsm  . Shellfish Allergy Anaphylaxis  . Shellfish-Derived Products Anaphylaxis  . Sulfa Antibiotics Shortness Of Breath  . Iodine Hives  . Atorvastatin Other (See Comments)    Pt states "causes bilateral leg pain/cramps."  . Benadryl [Diphenhydramine] Other (See Comments)     "THIS DRIVES ME CRAZY AND MAKES ME FEEL LIKE I AM DYING"  . Doxycycline Other (See Comments)    Unknown reaction, per patient  . Cephalexin Itching and Rash  . Zithromax [Azithromycin] Rash      Social History   Socioeconomic History  . Marital status: Widowed    Spouse name: Not on file  . Number of children: 5  . Years of education: Not on file  . Highest education level: Not on file  Occupational History  . Occupation: STAFF/BUFFET    Employer: [employer]  Tobacco Use  . Smoking status: Former Smoker    Packs/day: 0.25    Years: 3.00    Pack years: 0.75    Types: Cigarettes    Quit date: 02/11/1968    Years since quitting: 51.4  . Smokeless tobacco: Never Used  Substance and Sexual Activity  . Alcohol use: No  . Drug use: No  . Sexual activity: Never  Other Topics Concern  . Not on file  Social History Narrative   Widowed last year. She lives with her two sons.   Social Determinants of Health   Financial Resource Strain:   . Difficulty of Paying Living Expenses:   Food Insecurity:   . Worried About Charity fundraiser in the Last Year:   . Arboriculturist in the Last Year:   Transportation Needs:   . Film/video editor (Medical):   [name] Lack of Transportation (Non-Medical):   Physical Activity:   . Days of Exercise per Week:   . Minutes of Exercise per Session:   Stress:   . Feeling of Stress :   Social Connections:   . Frequency of Communication with Friends and Family:   . Frequency of Social Gatherings with Friends and Family:   .  Attends Religious Services:   . Active Member of Clubs or Organizations:   . Attends Archivist Meetings:   [name] Marital Status:   Intimate Partner Violence:   . Fear of Current or Ex-Partner:   . Emotionally Abused:   [name] Physically Abused:   . Sexually Abused:       Family History  Problem Relation Age of Onset  . Breast cancer Mother   . Diabetes Mother   . Heart disease Maternal Grandmother    . Kidney disease Maternal Grandmother   . Diabetes Maternal Grandmother   . Glaucoma Maternal Aunt   . Heart disease Maternal Aunt   . Asthma Sister   . Colon cancer Neg Hx   . Stomach cancer Neg Hx   . Pancreatic cancer Neg Hx     Vitals:   06/30/19 1347  BP: 101/62  Pulse: 72  SpO2: 97%  Weight: 110.7 kg (244 lb)   Wt Readings from Last 3 Encounters:  06/30/19 110.7 kg (244 lb)  06/08/19 108.9 kg (240 lb)  06/02/19 110 kg (242 lb 8 oz)     PHYSICAL EXAM: General:  Obese woman on O2 No resp difficulty HEENT: normal Neck: supple. no JVD. Carotids 2+ bilat; no bruits. No lymphadenopathy or thryomegaly appreciated. Cor: PMI nondisplaced. Regular rate & rhythm. No rubs, gallops or murmurs. Lungs: clear Abdomen: obese soft, nontender, nondistended. No hepatosplenomegaly. No bruits or masses. Good bowel sounds. Extremities: no cyanosis, clubbing, rash, edema Neuro: alert & orientedx3, cranial nerves grossly intact. moves all 4 extremities w/o difficulty. Affect pleasant  ECG: NSR 75 with v-paving Personally reviewed   ASSESSMENT & PLAN:  1. Chronic systolic/diastolic HF  - Echo 0/03 EF 55-60%, mild LVH.  - RHC in 8/18 looked good. - Echo 12/20 EF down to 40-45% in setting of loss of LV pacing which has now been restored - Echo 4/21 EF 50-55% - Volume status ok.  - NYHA III-IIIb improving with weight loss - Adding Jardiance 10   2. CAD s/p CABG 11/2014 -  s/p LCX stents in 2018 -  occasioanl CP but stable. Recent ER visit with flat hstrop - no nned for further w/u at this time  3. Morbid obesity - congratulated her on weight loss. Urged her to continue  4. H/o heart block s/p BiV pacer/CRT-D [employer] - Followed by Dr. Recardo Evangelist. Recent LV lead reprogrammed  5. Chronic renal failure stage IV - has f/u with Nephrology - Starting Jardiance  6. OSA:  - Says she can't tolerate CPAP  7. Chronic respiratory failure - likely OSA/OHS - continue O2 -  congratulated her on weight loss. Urged her to continue  8. DM2 - currently on Onglyza - with CAD, HF and CKD will switch to Lottie Rater, MD   2:56 PM

## 2019-07-01 DIAGNOSIS — R2689 Other abnormalities of gait and mobility: Secondary | ICD-10-CM | POA: Diagnosis not present

## 2019-07-04 ENCOUNTER — Telehealth (HOSPITAL_COMMUNITY): Payer: Self-pay | Admitting: *Deleted

## 2019-07-04 NOTE — Telephone Encounter (Signed)
Pt called inquiring about oxygen order. Per Adline Potter she was waiting for Dr.Bensimhon to sign his note and then she would fax the order. Pt aware and thanked me for the return call.

## 2019-07-05 NOTE — Progress Notes (Signed)
LATE ENTRY:  SATURATION QUALIFICATIONS: (This note is used to comply with regulatory documentation for home oxygen)  Patient Saturations on Room Air at Rest = 93%  Patient Saturations on Room Air while Ambulating = 82%  Patient Saturations on 3 Liters of oxygen while Ambulating = 96%  Please briefly explain why patient needs home oxygen: hypoxia, heart failure

## 2019-07-05 NOTE — Addendum Note (Signed)
Encounter addended by: Scarlette Calico, RN on: 07/05/2019 9:18 AM  Actions taken: Clinical Note Signed

## 2019-07-06 DIAGNOSIS — I5032 Chronic diastolic (congestive) heart failure: Secondary | ICD-10-CM | POA: Diagnosis not present

## 2019-07-06 DIAGNOSIS — R269 Unspecified abnormalities of gait and mobility: Secondary | ICD-10-CM | POA: Diagnosis not present

## 2019-07-06 DIAGNOSIS — J9611 Chronic respiratory failure with hypoxia: Secondary | ICD-10-CM | POA: Diagnosis not present

## 2019-07-06 DIAGNOSIS — R062 Wheezing: Secondary | ICD-10-CM | POA: Diagnosis not present

## 2019-07-07 DIAGNOSIS — J9611 Chronic respiratory failure with hypoxia: Secondary | ICD-10-CM | POA: Diagnosis not present

## 2019-07-07 DIAGNOSIS — J9612 Chronic respiratory failure with hypercapnia: Secondary | ICD-10-CM | POA: Diagnosis not present

## 2019-07-07 DIAGNOSIS — I5022 Chronic systolic (congestive) heart failure: Secondary | ICD-10-CM | POA: Diagnosis not present

## 2019-07-08 ENCOUNTER — Encounter: Payer: Self-pay | Admitting: Gastroenterology

## 2019-07-08 ENCOUNTER — Ambulatory Visit (INDEPENDENT_AMBULATORY_CARE_PROVIDER_SITE_OTHER): Payer: Medicare HMO | Admitting: Gastroenterology

## 2019-07-08 VITALS — BP 96/56 | HR 71 | Ht 63.0 in | Wt 244.6 lb

## 2019-07-08 DIAGNOSIS — R140 Abdominal distension (gaseous): Secondary | ICD-10-CM | POA: Diagnosis not present

## 2019-07-08 DIAGNOSIS — R6889 Other general symptoms and signs: Secondary | ICD-10-CM | POA: Diagnosis not present

## 2019-07-08 DIAGNOSIS — K449 Diaphragmatic hernia without obstruction or gangrene: Secondary | ICD-10-CM

## 2019-07-08 DIAGNOSIS — K219 Gastro-esophageal reflux disease without esophagitis: Secondary | ICD-10-CM | POA: Diagnosis not present

## 2019-07-08 DIAGNOSIS — K581 Irritable bowel syndrome with constipation: Secondary | ICD-10-CM

## 2019-07-08 MED ORDER — PANTOPRAZOLE SODIUM 40 MG PO TBEC: 40.0000 mg | DELAYED_RELEASE_TABLET | Freq: Two times a day (BID) | ORAL | 3 refills | Status: DC

## 2019-07-08 NOTE — Progress Notes (Signed)
[name]    [id number]    May 26, 1945  Primary Care Physician:[name], [name], MD  Referring Physician: [facility] [address] [place],  [place] [address]   Chief complaint:  GERD, constipation  HPI:  74 year old female with multiple comorbidities, morbid obesity, congestive heart failure on home oxygen.  Chronic antiplatelet therapy with Plavix.  Intermittent reflux, regurgitation, sensation of choking.  She also has gurgling sounds after she drinks or eats anything. Bowel habits worse after gallbladder surgery.  She has intermittent constipation, takes MiraLAX 2 capfuls as needed with subsequent diarrhea. She is changed her diet and activity, has lost 30 pounds so far.  Overall feels better No melena or blood per rectum.  No vomiting.  EGD and colonoscopy in November 2019.  EGD revealed grade B esophagitis, small hiatal hernia, gastritis, and a small nodule in the stomach.  Gastric biopsies showed no H. pylori.  Showed reactive gastropathy with ulceration.  Colonoscopy revealed polyps that were removed and were tubular adenomas.  Also had diverticulosis and internal/external hemorrhoids.  EGD December 14, 2006 showed mild antral gastritis otherwise normal exam.  H. pylori CLOtest negative.  Colonoscopy September 27, 2002 by Dr. Olevia Perches with removal of small diminutive hyperplastic polyps and left-sided diverticulosis  Outpatient Encounter Medications as of 07/08/2019  Medication Sig  . acetaminophen (TYLENOL) 500 MG tablet Take 500-1,000 mg by mouth every 12 (twelve) hours as needed (for pain or headaches).   [name] albuterol (PROVENTIL HFA;VENTOLIN HFA) 108 (90 Base) MCG/ACT inhaler Inhale 2 puffs into the lungs every 6 (six) hours as needed for wheezing or shortness of breath.  [name] albuterol (PROVENTIL) (2.5 MG/3ML) 0.083% nebulizer solution Take 3 mLs (2.5 mg total) by nebulization every 6 (six) hours as needed for wheezing or  shortness of breath.  [name] aspirin EC 81 MG tablet Take 1 tablet (81 mg total) by mouth daily.  . Biotin 1000 MCG tablet Take 1,000 mcg by mouth daily.  . bisoprolol (ZEBETA) 5 MG tablet Take 5 mg by mouth daily.  . budesonide-formoterol (SYMBICORT) 160-4.5 MCG/ACT inhaler Inhale 2 puffs into the lungs 2 (two) times daily.  . cetirizine (ZYRTEC) 10 MG tablet Take 10 mg by mouth daily.   . clopidogrel (PLAVIX) 75 MG tablet Take 75 mg by mouth daily.  . clotrimazole (LOTRIMIN) 1 % cream Apply 1 application topically 2 (two) times daily as needed (rash).  . clotrimazole (MYCELEX) 10 MG troche Take 10 mg by mouth daily as needed (Sore Mouth/thrush).   . colchicine 0.6 MG tablet Take 0.6 mg by mouth 2 (two) times daily as needed (for gout flares).  . cycloSPORINE (RESTASIS) 0.05 % ophthalmic emulsion Place 1 drop into both eyes 2 (two) times daily.  . diclofenac sodium (VOLTAREN) 1 % GEL Apply 2 g topically 4 (four) times daily. (Patient taking differently: Apply 2 g topically 4 (four) times daily as needed (pain.). )  . empagliflozin (JARDIANCE) 10 MG TABS tablet Take 10 mg by mouth daily before breakfast.  . EPINEPHrine (EPIPEN 2-PAK) 0.3 mg/0.3 mL IJ SOAJ injection Inject 0.3 mg into the muscle once as needed (severe allergic reaction).  . febuxostat (ULORIC) 40 MG tablet Take 40 mg by mouth daily.  [name] guaiFENesin (MUCINEX) 600 MG 12 hr tablet Take 600 mg by mouth at bedtime as needed for cough or to loosen phlegm.   . hydrocortisone 2.5 % cream Apply 1 application topically daily as needed (  itching).   . hydroxypropyl methylcellulose / hypromellose (ISOPTO TEARS / GONIOVISC) 2.5 % ophthalmic solution Place 1 drop into both eyes 3 (three) times daily.   [name] ipratropium (ATROVENT) 0.02 % nebulizer solution Take 2.5 mLs (0.5 mg total) by nebulization 4 (four) times daily. (Patient taking differently: Take 0.5 mg by nebulization every 6 (six) hours as needed for wheezing or shortness of breath. )  .  isosorbide mononitrate (IMDUR) 30 MG 24 hr tablet Take 90 mg by mouth daily.  . metolazone (ZAROXOLYN) 2.5 MG tablet Take 2.5 mg by mouth 2 (two) times a week. Monday and Thursday  . nitroGLYCERIN (NITROSTAT) 0.4 MG SL tablet Place 1 tablet (0.4 mg total) under the tongue every 5 (five) minutes as needed for chest pain.  [name] ondansetron (ZOFRAN-ODT) 4 MG disintegrating tablet Take 4 mg by mouth every 8 (eight) hours as needed for nausea or vomiting.  . OXYGEN Inhale 3 L/min into the lungs continuous.   . polyethylene glycol (MIRALAX / GLYCOLAX) 17 g packet Take 17 g by mouth in the morning.   . potassium chloride SA (KLOR-CON) 20 MEQ tablet Take 20 mEq by mouth 2 (two) times daily.  . predniSONE (DELTASONE) 10 MG tablet Take 10 mg by mouth daily as needed (as directed for gout flares).   . ranolazine (RANEXA) 1000 MG SR tablet Take 1 tablet (1,000 mg total) by mouth 2 (two) times daily.  . rosuvastatin (CRESTOR) 20 MG tablet Take 20 mg by mouth daily.  [name] spironolactone (ALDACTONE) 25 MG tablet Take 25 mg by mouth at bedtime.   . torsemide (DEMADEX) 20 MG tablet Take 60 mg by mouth daily.  [name] triamcinolone cream (KENALOG) 0.1 % Apply 1 application topically 2 (two) times daily as needed (itching).    No facility-administered encounter medications on file as of 07/08/2019.    Allergies as of 07/08/2019 - Review Complete 07/08/2019  Allergen Reaction Noted  . Ivp dye [iodinated diagnostic agents] Shortness Of Breath 10/13/2010  . Shellfish allergy Anaphylaxis 11/20/2017  . Shellfish-derived products Anaphylaxis 10/13/2010  . Sulfa antibiotics Shortness Of Breath   . Iodine Hives 10/13/2010  . Atorvastatin Other (See Comments) 11/12/2015  . Benadryl [diphenhydramine] Other (See Comments) 05/31/2019  . Doxycycline Other (See Comments) 04/18/2017  . Cephalexin Itching and Rash 04/30/2015  . Zithromax [azithromycin] Rash 06/25/2014    Past Medical History:  Diagnosis Date  . AICD (automatic  cardioverter/defibrillator) present   . Anemia   . Arthritis   . Asthma   . CAD (coronary artery disease)    a. 10/09/16 LHC: SVG->LAD patent, SVG->Diag patent, SVG->RCA patent, SVG->LCx occluded. EF 60%, b. 10/31/16 LHC DES to AV groove Circ, DES to intermed branch  . Cellulitis and abscess of foot 12/2014   RT FOOT  . CHF (congestive heart failure) ([place])   . Chronic bronchitis ([place])   . Chronic lower back pain   . Diverticulosis   . Facial numbness 02/2016  . Fatty liver disease, nonalcoholic   . Gastritis   . GERD (gastroesophageal reflux disease)   . Gout   . H/O hiatal hernia   . High cholesterol   . Hyperplastic colon polyp   . Hypertension   . IBS (irritable bowel syndrome)   . Internal hemorrhoids   . Ischemic cardiomyopathy   . Obesity   . On home oxygen therapy    "2L at night" (10/31/2016)  . OSA (obstructive sleep apnea)    "can't tolerate a mask" (10/30/2016)  . Pneumonia    "  couple times" (10/31/2016)  . Shortness of breath   . TIA (transient ischemic attack)    "recently" (10/31/2016)  . Type II diabetes mellitus ([place])     Past Surgical History:  Procedure Laterality Date  . ABDOMINAL ULTRASOUND  12/01/2011   Peripelvic cysts- #1- 2.4x1.9x2.3cm, #2-1.2x0.9x1.2cm  . ANKLE FRACTURE SURGERY Right    "had rod put in"  . ANTERIOR CERVICAL DECOMP/DISCECTOMY FUSION    . APPENDECTOMY    . BACK SURGERY    . BIOPSY  12/29/2017   Procedure: BIOPSY;  Surgeon: [name], MD;  Location: [facility];  Service: Endoscopy;;  . BIV ICD INSERTION CRT-D  2001?  [name] BIV PACEMAKER GENERATOR CHANGE OUT N/A 11/02/2012   Procedure: BIV PACEMAKER GENERATOR CHANGE OUT;  Surgeon: Sanda Klein, MD;  Location: [facility];  Service: Cardiovascular;  Laterality: N/A;  . CARDIAC CATHETERIZATION  05/17/1999   No significant coronary obstructive disease w/ mild 20% luminal irregularity of the first diag branch of the LAD  . CARDIAC CATHETERIZATION  07/08/2002   No significant CAD,  moderately depressed LV systolic function  . CARDIAC CATHETERIZATION Bilateral 04/26/2007   Normal findings, recommend medical therapy  . CARDIAC CATHETERIZATION  02/18/2008   Moderate CAD, would benefit from having a functional study, recommend continue medical therapy  . CARDIAC CATHETERIZATION  07/23/2012   Medical therapy  . CARDIAC CATHETERIZATION N/A 11/24/2014   Procedure: Left Heart Cath and Coronary Angiography;  Surgeon: Troy Sine, MD;  Location: [facility];  Service: Cardiovascular;  Laterality: N/A;  . CARDIAC CATHETERIZATION  11/27/2014   Procedure: Intravascular Pressure Wire/FFR Study;  Surgeon: Peter M Martinique, MD;  Location: [facility];  Service: Cardiovascular;;  . CARDIAC CATHETERIZATION  10/09/2016  . CHOLECYSTECTOMY N/A 04/09/2018   Procedure: LAPAROSCOPIC CHOLECYSTECTOMY;  Surgeon: Greer Pickerel, MD;  Location: [facility];  Service: General;  Laterality: N/A;  . COLONOSCOPY WITH PROPOFOL N/A 12/29/2017   Procedure: COLONOSCOPY WITH PROPOFOL;  Surgeon: [name], MD;  Location: [facility];  Service: Endoscopy;  Laterality: N/A;  . CORONARY ANGIOGRAM  2010  . CORONARY ARTERY BYPASS GRAFT N/A 11/29/2014   Procedure: CORONARY ARTERY BYPASS GRAFTING x 5 (LIMA-LAD, SVG-D, SVG-OM1-OM2, SVG-PD);  Surgeon: Melrose Nakayama, MD;  Location: [facility];  Service: Open Heart Surgery;  Laterality: N/A;  . CORONARY STENT INTERVENTION N/A 10/31/2016   Procedure: CORONARY STENT INTERVENTION;  Surgeon: Burnell Blanks, MD;  Location: [facility];  Service: Cardiovascular;  Laterality: N/A;  . ESOPHAGOGASTRODUODENOSCOPY (EGD) WITH PROPOFOL N/A 12/29/2017   Procedure: ESOPHAGOGASTRODUODENOSCOPY (EGD) WITH PROPOFOL;  Surgeon: [name], MD;  [facility];  Service: Endoscopy;  Laterality: N/A;  . FRACTURE SURGERY    . INSERT / REPLACE / [place]  . KNEE ARTHROSCOPY Bilateral   . LEFT HEART CATH AND CORS/GRAFTS ANGIOGRAPHY  N/A 12/09/2017   Procedure: LEFT HEART CATH AND CORS/GRAFTS ANGIOGRAPHY;  Surgeon: Troy Sine, MD;  Location: [facility];  Service: Cardiovascular;  Laterality: N/A;  . LEFT HEART CATHETERIZATION WITH CORONARY ANGIOGRAM N/A 07/23/2012   Procedure: LEFT HEART CATHETERIZATION WITH CORONARY ANGIOGRAM;  Surgeon: Leonie Man, MD;  Location[facility];  Service: Cardiovascular;  Laterality: N/A;  Carlton Adam MYOVIEW  11/14/2011   Mild-moderate defect seen in Mid Inferolateral and Mid Anterolateral regions-consistant w/ infarct/scar. No significant ischemia demonstrated.  [name] POLYPECTOMY  12/29/2017   Procedure: POLYPECTOMY;  Surgeon: [name], MD;  [facility];  Service: Endoscopy;;  . RIGHT/LEFT HEART CATH AND CORONARY ANGIOGRAPHY N/A 10/09/2016   Procedure: RIGHT/LEFT HEART CATH AND CORONARY ANGIOGRAPHY;  Surgeon: Jolaine Artist, MD;  Location: [facility];  Service: Cardiovascular;  Laterality: N/A;  . RIGHT/LEFT HEART CATH AND CORONARY/GRAFT ANGIOGRAPHY N/A 03/11/2019   Procedure: RIGHT/LEFT HEART CATH AND CORONARY/GRAFT ANGIOGRAPHY;  Surgeon: Jolaine Artist, MD;  Location: [facility];  Service: Cardiovascular;  Laterality: N/A;  . TEE WITHOUT CARDIOVERSION N/A 11/29/2014   Procedure: TRANSESOPHAGEAL ECHOCARDIOGRAM (TEE);  Surgeon: Melrose Nakayama, MD;  Location: [facility];  Service: Open Heart Surgery;  Laterality: N/A;  . TRANSTHORACIC ECHOCARDIOGRAM  07/23/2012   EF 55-60%, normal-mild  . TUBAL LIGATION      Family History  Problem Relation Age of Onset  . Breast cancer Mother   . Diabetes Mother   . Heart disease Maternal Grandmother   . Kidney disease Maternal Grandmother   . Diabetes Maternal Grandmother   . Glaucoma Maternal Aunt   . Heart disease Maternal Aunt   . Asthma Sister   . Colon cancer Neg Hx   . Stomach cancer Neg Hx   . Pancreatic cancer Neg Hx     Social History   Socioeconomic History  . Marital status:  Widowed    Spouse name: Not on file  . Number of children: 5  . Years of education: Not on file  . Highest education level: Not on file  Occupational History  . Occupation: STAFF/BUFFET    Employer: [employer]  Tobacco Use  . Smoking status: Former Smoker    Packs/day: 0.25    Years: 3.00    Pack years: 0.75    Types: Cigarettes    Quit date: 02/11/1968    Years since quitting: 51.4  . Smokeless tobacco: Never Used  Substance and Sexual Activity  . Alcohol use: No  . Drug use: No  . Sexual activity: Never  Other Topics Concern  . Not on file  Social History Narrative   Widowed last year. She lives with her two sons.   Social Determinants of Health   Financial Resource Strain:   . Difficulty of Paying Living Expenses:   Food Insecurity:   . Worried About Charity fundraiser in the Last Year:   . Arboriculturist in the Last Year:   Transportation Needs:   . Film/video editor (Medical):   [name] Lack of Transportation (Non-Medical):   Physical Activity:   . Days of Exercise per Week:   . Minutes of Exercise per Session:   Stress:   . Feeling of Stress :   Social Connections:   . Frequency of Communication with Friends and Family:   . Frequency of Social Gatherings with Friends and Family:   . Attends Religious Services:   . Active Member of Clubs or Organizations:   . Attends Archivist Meetings:   [name] Marital Status:   Intimate Partner Violence:   . Fear of Current or Ex-Partner:   . Emotionally Abused:   [name] Physically Abused:   . Sexually Abused:       Review of systems:  All other review of systems negative except as mentioned in the HPI.   Physical Exam: Vitals:   07/08/19 1429  BP: (!) 96/56  Pulse: 71  SpO2: 91%   Body mass index is 43.33 kg/m. Gen:      No acute distress on continuous oxygen Abd:  soft, non-tender; no palpable masses, no distension Neuro: alert and oriented x 3 Psych: normal mood and affect  Data  Reviewed:  Reviewed labs, radiology imaging, old records and pertinent past GI work up   Assessment and Plan/Recommendations:  74 year old female with morbid obesity, multiple comorbidities, CAD s/p PCI on Plavix, CHF on continuous home O2  GERD and hiatal hernia: Increase Protonix 40 mg twice daily Antireflux measures  Chronic idiopathic constipation: Advised patient to take MiraLAX half capful daily and titrated based on response to have 1-2 soft bowel movements daily  History of adenomatous colon polyps, due for recall colonoscopy November 2022  Continue with dietary changes and exercise for weight loss  This visit required 35 minutes of patient care (this includes precharting, chart review, review of results, face-to-face time used for counseling as well as treatment plan and follow-up. The patient was provided an opportunity to ask questions and all were answered. The patient agreed with the plan and demonstrated an understanding of the instructions.  Damaris Hippo , MD    CC: Pa, [facility]*

## 2019-07-08 NOTE — Patient Instructions (Signed)
We have sent the following medications to your pharmacy for you to pick up at your convenience:  Pantoprazole  Take Miralax, 1/2 capful daily.  Please follow up in 4-6 months

## 2019-07-12 ENCOUNTER — Telehealth (HOSPITAL_COMMUNITY): Payer: Self-pay

## 2019-07-12 NOTE — Telephone Encounter (Signed)
Received a quick referral form for respiratory and other services and certificate of medical necessity. Forms were faxed to [phone]. Forms will be faxed into patients chart

## 2019-07-20 DIAGNOSIS — M19072 Primary osteoarthritis, left ankle and foot: Secondary | ICD-10-CM | POA: Diagnosis not present

## 2019-07-20 DIAGNOSIS — I10 Essential (primary) hypertension: Secondary | ICD-10-CM | POA: Diagnosis not present

## 2019-07-20 DIAGNOSIS — I2510 Atherosclerotic heart disease of native coronary artery without angina pectoris: Secondary | ICD-10-CM | POA: Diagnosis not present

## 2019-07-20 DIAGNOSIS — M109 Gout, unspecified: Secondary | ICD-10-CM | POA: Diagnosis not present

## 2019-07-20 DIAGNOSIS — N189 Chronic kidney disease, unspecified: Secondary | ICD-10-CM | POA: Diagnosis not present

## 2019-07-20 DIAGNOSIS — E119 Type 2 diabetes mellitus without complications: Secondary | ICD-10-CM | POA: Diagnosis not present

## 2019-07-20 DIAGNOSIS — Z8679 Personal history of other diseases of the circulatory system: Secondary | ICD-10-CM | POA: Diagnosis not present

## 2019-07-21 ENCOUNTER — Other Ambulatory Visit: Payer: Self-pay | Admitting: Nurse Practitioner

## 2019-07-21 DIAGNOSIS — N63 Unspecified lump in unspecified breast: Secondary | ICD-10-CM

## 2019-07-22 ENCOUNTER — Other Ambulatory Visit: Payer: Self-pay | Admitting: Nurse Practitioner

## 2019-08-01 DIAGNOSIS — R2689 Other abnormalities of gait and mobility: Secondary | ICD-10-CM | POA: Diagnosis not present

## 2019-08-07 DIAGNOSIS — J9612 Chronic respiratory failure with hypercapnia: Secondary | ICD-10-CM | POA: Diagnosis not present

## 2019-08-07 DIAGNOSIS — J9611 Chronic respiratory failure with hypoxia: Secondary | ICD-10-CM | POA: Diagnosis not present

## 2019-08-07 DIAGNOSIS — I5022 Chronic systolic (congestive) heart failure: Secondary | ICD-10-CM | POA: Diagnosis not present

## 2019-08-08 ENCOUNTER — Encounter: Payer: Self-pay | Admitting: Cardiovascular Disease

## 2019-08-08 ENCOUNTER — Ambulatory Visit (INDEPENDENT_AMBULATORY_CARE_PROVIDER_SITE_OTHER): Payer: Medicare HMO | Admitting: Cardiovascular Disease

## 2019-08-08 ENCOUNTER — Other Ambulatory Visit: Payer: Self-pay

## 2019-08-08 VITALS — BP 106/64 | HR 71 | Ht 62.0 in | Wt 243.0 lb

## 2019-08-08 DIAGNOSIS — N1832 Chronic kidney disease, stage 3b: Secondary | ICD-10-CM

## 2019-08-08 DIAGNOSIS — T82111A Breakdown (mechanical) of cardiac pulse generator (battery), initial encounter: Secondary | ICD-10-CM

## 2019-08-08 DIAGNOSIS — I442 Atrioventricular block, complete: Secondary | ICD-10-CM | POA: Diagnosis not present

## 2019-08-08 DIAGNOSIS — G4733 Obstructive sleep apnea (adult) (pediatric): Secondary | ICD-10-CM | POA: Diagnosis not present

## 2019-08-08 DIAGNOSIS — I5042 Chronic combined systolic (congestive) and diastolic (congestive) heart failure: Secondary | ICD-10-CM

## 2019-08-08 DIAGNOSIS — I25118 Atherosclerotic heart disease of native coronary artery with other forms of angina pectoris: Secondary | ICD-10-CM

## 2019-08-08 DIAGNOSIS — E1121 Type 2 diabetes mellitus with diabetic nephropathy: Secondary | ICD-10-CM

## 2019-08-08 DIAGNOSIS — E782 Mixed hyperlipidemia: Secondary | ICD-10-CM

## 2019-08-08 NOTE — Patient Instructions (Signed)
Medication Instructions:  No changes If you need a refill on your cardiac medications before your next appointment, please call your pharmacy.   Lab work: None ordered If you have labs (blood work) drawn today and your tests are completely normal, you will receive your results only by: [place] (if you have MyChart) OR A paper copy in the mail If you have any lab test that is abnormal or we need to change your treatment, we will call you to review the results.  Testing/Procedures: None ordered  Follow-Up: At [facility], you and your health needs are our priority.  As part of our continuing mission to provide you with exceptional heart care, we have created designated Provider Care Teams.  These Care Teams include your primary Cardiologist (physician) and Advanced Practice Providers (APPs -  Physician Assistants and Nurse Practitioners) who all work together to provide you with the care you need, when you need it.  We recommend signing up for the patient portal called "MyChart".  Sign up information is provided on this After Visit Summary.  MyChart is used to connect with patients for Virtual Visits (Telemedicine).  Patients are able to view lab/test results, encounter notes, upcoming appointments, etc.  Non-urgent messages can be sent to your provider as well.   To learn more about what you can do with MyChart, go to [email or web address].    Your next appointment:   3 month(s) on a pacer day  The format for your next appointment:   In Person  Provider:   Sanda Klein, MD

## 2019-08-08 NOTE — Progress Notes (Signed)
Patient ID: [name], female   DOB: Oct 08, 1945, 74 y.o.   MRN: [id number] .    Cardiology Office Note    Date:  08/09/2019   ID:  [name], DOB 1945/09/02, MRN [id number]  PCP:  Lujean Amel, MD  Cardiologist:   Sanda Klein, MD   No chief complaint on file.   History of Present Illness:  [name] is a 74 y.o. female with CAD s/p CABG October 2016 (LIMA to LAD, SVG to D1, SVG to OM1 and OM2, SVG to PDA), diastolic heart failure, morbid obesity, adrenal insufficiency, type 2 diabetes mellitus, Hypertension, stage III chronic kidney disease, gout, obstructive sleep apnea, asthma. Nonischemic cardiomyopathy and heart failure preceded the diagnosis of coronary disease. She has complete heart block and had a conventional pacemaker implanted in 2001. This was upgraded to a biventricular ICD implanted in 2008 for severe cardiomyopathy with ejection fraction 20%, downgraded to CRT-P in 2014 ([place]) following excellent response to resynchronization (hyper-responder, normalization of left ventricular systolic function). The current atrial and right ventricular leads were implanted in 2001, the left ventricular lead was implanted in 2008. The defibrillator lead implanted in 2008 is capped and its tip was resected at the time of bypass surgery. Ever since bypass surgery, atrial pacing thresholds have been extremely high, so her device is programmed VDD.   Developed symptoms of heart failure again in the fall of 2020 and echocardiogram 01/18/2019 showed EF had deteriorated, down to 40-45% (had been normal in 2017) and left atrial pressures were increased.  On 12/17 she was seen in the clinic and it was noted that she had lost left ventricular capture.  The output was increased in the coronary sinus lead.    Recent data from [employer] raises concern about possible unexpected reset of the pacemaker generator to the [employer] settings.  This occurs potentially  during the increased sudden battery drain during remote transmissions.  We have decided to perform device checks only in the office from this point forward.  Estimated generator longevity is 3 years (checked today).  She has 100% biventricular pacing.  Her device is programmed DDD due to failure of the atrial lead.  She has had 1 brief episode of paroxysmal atrial tachycardia and a single 4 beat run of nonsustained VT since her last device check.  [employer] Intua CRT-P device was implanted in 2014  She feels substantially better.  She has been trying hard to eat a healthy diet and has lost about 30 pounds.  Her breathing is much improved.  She did have to take prednisone for a gout attack but will complete the course this Thursday.  Her blood sugar has tolerated the medication better than in the past.  right and left heart catheterization with Dr. Haroldine Laws on 03/11/2019.  Coronary anatomy was unchanged, with known occlusion of the SVG to OM but with other bypasses patent and with patent stents in the  native left circumflex coronary artery and ramus intermedius.  Left heart filling pressures were perfectly compensated with a wedge pressure of 14, but there was still evidence of mild pulmonary hypertension (PA 34/17, mean 24 mmHg, PVR 1.5, normal cardiac output with an index of 3.1 L/minute/m sq).   Past Medical History:  Diagnosis Date  . ABDOMINAL PAIN -GENERALIZED 02/21/2010   Qualifier: Diagnosis of  By: Chester Holstein NP, Nevin Bloodgood    . AICD (automatic cardioverter/defibrillator) present   . Anemia   . Arthritis   . Asthma   .  Atrial fibrillation ([place]) 10/24/2015   Questionable history of A Fib/A Flutter. On device interrogation on 9/7, showed 0.57min of A Fib/A Flutter with 1% burden.  Device check in Jan 2018 showed no sustained AF (runs of less than 30 seconds). No anticoagulation indicated.  [name] CAD (coronary artery disease)    a. 10/09/16 LHC: SVG->LAD patent, SVG->Diag patent, SVG->RCA patent,  SVG->LCx occluded. EF 60%, b. 10/31/16 LHC DES to AV groove Circ, DES to intermed branch  . Cellulitis and abscess of foot 12/2014   RT FOOT  . CHF (congestive heart failure) ([place])   . Chronic bronchitis ([place])   . Chronic lower back pain   . Complete heart block ([place]) 06/22/2015  . Diverticulosis   . Facial numbness 02/2016  . Fatty liver disease, nonalcoholic   . Gastritis   . GERD (gastroesophageal reflux disease)   . Gout   . H/O hiatal hernia   . HEMORRHOIDS-EXTERNAL 02/21/2010   Qualifier: Diagnosis of  By: Chester Holstein NP, Nevin Bloodgood    . High cholesterol   . Hyperplastic colon polyp   . Hypertension   . IBS (irritable bowel syndrome)   . Internal hemorrhoids   . INTERNAL HEMORRHOIDS WITHOUT MENTION COMP 04/12/2007   Qualifier: Diagnosis of  By: Olevia Perches MD, Lowella Bandy   . Ischemic cardiomyopathy   . Nonischemic cardiomyopathy ([place]) 11/22/2011   Pt responded to BiV ICD- last EF 55-60% Sept 2017  . Obesity   . On home oxygen therapy    "2L at night" (10/31/2016)  . OSA (obstructive sleep apnea)    "can't tolerate a mask" (10/30/2016)  . PERSONAL HX COLONIC POLYPS 02/21/2010   Qualifier: Diagnosis of  By: Chester Holstein NP, Nevin Bloodgood    . Pneumonia    "couple times" (10/31/2016)  . Right facial numbness 03/05/2016  . Shortness of breath   . TIA (transient ischemic attack)    "recently" (10/31/2016)  . Type II diabetes mellitus ([place])     Past Surgical History:  Procedure Laterality Date  . ABDOMINAL ULTRASOUND  12/01/2011   Peripelvic cysts- #1- 2.4x1.9x2.3cm, #2-1.2x0.9x1.2cm  . ANKLE FRACTURE SURGERY Right    "had rod put in"  . ANTERIOR CERVICAL DECOMP/DISCECTOMY FUSION    . APPENDECTOMY    . BACK SURGERY    . BIOPSY  12/29/2017   Procedure: BIOPSY;  Surgeon: [name], MD;  Location: [facility];  Service: Endoscopy;;  . BIV ICD INSERTION CRT-D  2001?  [name] BIV PACEMAKER GENERATOR CHANGE OUT N/A 11/02/2012   Procedure: BIV PACEMAKER GENERATOR CHANGE OUT;  Surgeon: Sanda Klein, MD;   Location: [facility];  Service: Cardiovascular;  Laterality: N/A;  . CARDIAC CATHETERIZATION  05/17/1999   No significant coronary obstructive disease w/ mild 20% luminal irregularity of the first diag branch of the LAD  . CARDIAC CATHETERIZATION  07/08/2002   No significant CAD, moderately depressed LV systolic function  . CARDIAC CATHETERIZATION Bilateral 04/26/2007   Normal findings, recommend medical therapy  . CARDIAC CATHETERIZATION  02/18/2008   Moderate CAD, would benefit from having a functional study, recommend continue medical therapy  . CARDIAC CATHETERIZATION  07/23/2012   Medical therapy  . CARDIAC CATHETERIZATION N/A 11/24/2014   Procedure: Left Heart Cath and Coronary Angiography;  Surgeon: Troy Sine, MD;  Location: [facility];  Service: Cardiovascular;  Laterality: N/A;  . CARDIAC CATHETERIZATION  11/27/2014   Procedure: Intravascular Pressure Wire/FFR Study;  Surgeon: Peter M Martinique, MD;  Location: [facility];  Service: Cardiovascular;;  .  CARDIAC CATHETERIZATION  10/09/2016  . CHOLECYSTECTOMY N/A 04/09/2018   Procedure: LAPAROSCOPIC CHOLECYSTECTOMY;  Surgeon: Greer Pickerel, MD;  [facility]: [facility];  Service: General;  Laterality: N/A;  . COLONOSCOPY WITH PROPOFOL N/A 12/29/2017   Procedure: COLONOSCOPY WITH PROPOFOL;  Surgeon: [name], MD;  [facility];  Service: Endoscopy;  Laterality: N/A;  . CORONARY ANGIOGRAM  2010  . CORONARY ARTERY BYPASS GRAFT N/A 11/29/2014   Procedure: CORONARY ARTERY BYPASS GRAFTING x 5 (LIMA-LAD, SVG-D, SVG-OM1-OM2, SVG-PD);  Surgeon: Melrose Nakayama, MD;  Location: [facility];  Service: Open Heart Surgery;  Laterality: N/A;  . CORONARY STENT INTERVENTION N/A 10/31/2016   Procedure: CORONARY STENT INTERVENTION;  Surgeon: Burnell Blanks, MD;  Location: [facility];  Service: Cardiovascular;  Laterality: N/A;  . ESOPHAGOGASTRODUODENOSCOPY (EGD) WITH PROPOFOL N/A 12/29/2017   Procedure:  ESOPHAGOGASTRODUODENOSCOPY (EGD) WITH PROPOFOL;  Surgeon: [name], MD;  [facility];  Service: Endoscopy;  Laterality: N/A;  . FRACTURE SURGERY    . INSERT / REPLACE / [place]  . KNEE ARTHROSCOPY Bilateral   . LEFT HEART CATH AND CORS/GRAFTS ANGIOGRAPHY N/A 12/09/2017   Procedure: LEFT HEART CATH AND CORS/GRAFTS ANGIOGRAPHY;  Surgeon: Troy Sine, MD;  Location: [facility];  Service: Cardiovascular;  Laterality: N/A;  . LEFT HEART CATHETERIZATION WITH CORONARY ANGIOGRAM N/A 07/23/2012   Procedure: LEFT HEART CATHETERIZATION WITH CORONARY ANGIOGRAM;  Surgeon: Leonie Man, MD;  Location: [facility];  Service: Cardiovascular;  Laterality: N/A;  Carlton Adam MYOVIEW  11/14/2011   Mild-moderate defect seen in Mid Inferolateral and Mid Anterolateral regions-consistant w/ infarct/scar. No significant ischemia demonstrated.  [name] POLYPECTOMY  12/29/2017   Procedure: POLYPECTOMY;  Surgeon: [name], MD;  [facility];  Service: Endoscopy;;  . RIGHT/LEFT HEART CATH AND CORONARY ANGIOGRAPHY N/A 10/09/2016   Procedure: RIGHT/LEFT HEART CATH AND CORONARY ANGIOGRAPHY;  Surgeon: Jolaine Artist, MD;  Location: [place] [facility];  Service: Cardiovascular;  Laterality: N/A;  . RIGHT/LEFT HEART CATH AND CORONARY/GRAFT ANGIOGRAPHY N/A 03/11/2019   Procedure: RIGHT/LEFT HEART CATH AND CORONARY/GRAFT ANGIOGRAPHY;  Surgeon: Jolaine Artist, MD;  Location: [place] [facility];  Service: Cardiovascular;  Laterality: N/A;  . TEE WITHOUT CARDIOVERSION N/A 11/29/2014   Procedure: TRANSESOPHAGEAL ECHOCARDIOGRAM (TEE);  Surgeon: Melrose Nakayama, MD;  Location: [facility];  Service: Open Heart Surgery;  Laterality: N/A;  . TRANSTHORACIC ECHOCARDIOGRAM  07/23/2012   EF 55-60%, normal-mild  . TUBAL LIGATION      Current Medications: Outpatient Medications Prior to Visit  Medication Sig Dispense Refill  . acetaminophen (TYLENOL) 500 MG tablet Take  500-1,000 mg by mouth every 12 (twelve) hours as needed (for pain or headaches).     [name] albuterol (PROVENTIL HFA;VENTOLIN HFA) 108 (90 Base) MCG/ACT inhaler Inhale 2 puffs into the lungs every 6 (six) hours as needed for wheezing or shortness of breath. 1 Inhaler 0  . albuterol (PROVENTIL) (2.5 MG/3ML) 0.083% nebulizer solution Take 3 mLs (2.5 mg total) by nebulization every 6 (six) hours as needed for wheezing or shortness of breath. 75 mL 0  . allopurinol (ZYLOPRIM) 100 MG tablet     . aspirin EC 81 MG tablet Take 1 tablet (81 mg total) by mouth daily. 90 tablet 3  . Biotin 1000 MCG tablet Take 1,000 mcg by mouth daily.    . bisoprolol (ZEBETA) 5 MG tablet Take 5 mg by mouth daily.    . budesonide-formoterol (SYMBICORT) 160-4.5 MCG/ACT inhaler Inhale  2 puffs into the lungs 2 (two) times daily.    . cetirizine (ZYRTEC) 10 MG tablet Take 10 mg by mouth daily.     . clopidogrel (PLAVIX) 75 MG tablet Take 75 mg by mouth daily.    . clotrimazole (LOTRIMIN) 1 % cream Apply 1 application topically 2 (two) times daily as needed (rash).    . clotrimazole (MYCELEX) 10 MG troche Take 10 mg by mouth daily as needed (Sore Mouth/thrush).     . colchicine 0.6 MG tablet Take 0.6 mg by mouth 2 (two) times daily as needed (for gout flares).    . cycloSPORINE (RESTASIS) 0.05 % ophthalmic emulsion Place 1 drop into both eyes 2 (two) times daily.    . diclofenac sodium (VOLTAREN) 1 % GEL Apply 2 g topically 4 (four) times daily. (Patient taking differently: Apply 2 g topically 4 (four) times daily as needed (pain.). ) 1 Tube 0  . empagliflozin (JARDIANCE) 10 MG TABS tablet Take 10 mg by mouth daily before breakfast. 90 tablet 3  . EPINEPHrine (EPIPEN 2-PAK) 0.3 mg/0.3 mL IJ SOAJ injection Inject 0.3 mg into the muscle once as needed (severe allergic reaction).    . febuxostat (ULORIC) 40 MG tablet Take 40 mg by mouth daily.  3  . guaiFENesin (MUCINEX) 600 MG 12 hr tablet Take 600 mg by mouth at bedtime as needed for  cough or to loosen phlegm.     . hydrocortisone 2.5 % cream Apply 1 application topically daily as needed (itching).   1  . hydroxypropyl methylcellulose / hypromellose (ISOPTO TEARS / GONIOVISC) 2.5 % ophthalmic solution Place 1 drop into both eyes 3 (three) times daily.     [name] ipratropium (ATROVENT) 0.02 % nebulizer solution Take 2.5 mLs (0.5 mg total) by nebulization 4 (four) times daily. (Patient taking differently: Take 0.5 mg by nebulization every 6 (six) hours as needed for wheezing or shortness of breath. ) 75 mL 0  . isosorbide mononitrate (IMDUR) 30 MG 24 hr tablet Take 90 mg by mouth daily.    . metolazone (ZAROXOLYN) 2.5 MG tablet Take 2.5 mg by mouth 2 (two) times a week. Monday and Thursday    . nitroGLYCERIN (NITROSTAT) 0.4 MG SL tablet Place 1 tablet (0.4 mg total) under the tongue every 5 (five) minutes as needed for chest pain. 25 tablet 3  . ondansetron (ZOFRAN-ODT) 4 MG disintegrating tablet Take 4 mg by mouth every 8 (eight) hours as needed for nausea or vomiting.    . OXYGEN Inhale 3 L/min into the lungs continuous.     . pantoprazole (PROTONIX) 40 MG tablet Take 1 tablet (40 mg total) by mouth 2 (two) times daily. 180 tablet 3  . polyethylene glycol (MIRALAX / GLYCOLAX) 17 g packet Take 17 g by mouth in the morning.     . potassium chloride SA (KLOR-CON) 20 MEQ tablet Take 20 mEq by mouth 2 (two) times daily.    . predniSONE (DELTASONE) 10 MG tablet Take 10 mg by mouth daily as needed (as directed for gout flares).     . ranolazine (RANEXA) 1000 MG SR tablet Take 1 tablet (1,000 mg total) by mouth 2 (two) times daily. 60 tablet 2  . rosuvastatin (CRESTOR) 20 MG tablet Take 20 mg by mouth daily.    [name] spironolactone (ALDACTONE) 25 MG tablet Take 25 mg by mouth at bedtime.     . torsemide (DEMADEX) 20 MG tablet Take 60 mg by mouth daily.    [name] triamcinolone cream (  KENALOG) 0.1 % Apply 1 application topically 2 (two) times daily as needed (itching).      No facility-administered  medications prior to visit.     Allergies:   Ivp dye [iodinated diagnostic agents], Shellfish allergy, Shellfish-derived products, Sulfa antibiotics, Iodine, Atorvastatin, Benadryl [diphenhydramine], Doxycycline, Cephalexin, and Zithromax [azithromycin]   Social History   Socioeconomic History  . Marital status: Widowed    Spouse name: Not on file  . Number of children: 5  . Years of education: Not on file  . Highest education level: Not on file  Occupational History  . Occupation: STAFF/BUFFET    Employer: [employer]  Tobacco Use  . Smoking status: Former Smoker    Packs/day: 0.25    Years: 3.00    Pack years: 0.75    Types: Cigarettes    Quit date: 02/11/1968    Years since quitting: 51.5  . Smokeless tobacco: Never Used  Vaping Use  . Vaping Use: Never used  Substance and Sexual Activity  . Alcohol use: No  . Drug use: No  . Sexual activity: Never  Other Topics Concern  . Not on file  Social History Narrative   Widowed last year. She lives with her two sons.   Social Determinants of Health   Financial Resource Strain:   . Difficulty of Paying Living Expenses:   Food Insecurity:   . Worried About Charity fundraiser in the Last Year:   . Arboriculturist in the Last Year:   Transportation Needs:   . Film/video editor (Medical):   [name] Lack of Transportation (Non-Medical):   Physical Activity:   . Days of Exercise per Week:   . Minutes of Exercise per Session:   Stress:   . Feeling of Stress :   Social Connections:   . Frequency of Communication with Friends and Family:   . Frequency of Social Gatherings with Friends and Family:   . Attends Religious Services:   . Active Member of Clubs or Organizations:   . Attends Archivist Meetings:   [name] Marital Status:      Family History:  The patient's family history includes Asthma in her sister; Breast cancer in her mother; Diabetes in her maternal grandmother and mother; Glaucoma in her  maternal aunt; Heart disease in her maternal aunt and maternal grandmother; Kidney disease in her maternal grandmother.   ROS:   Please see the history of present illness.    ROS All other systems are reviewed and are negative.   PHYSICAL EXAM:   VS:  BP 106/64   Pulse 71   Ht 5\' 2"  (1.575 m)   Wt 243 lb (110.2 kg)   SpO2 99%   BMI 44.45 kg/m      General: Alert, oriented x3, no distress, morbidly obese Head: no evidence of trauma, PERRL, EOMI, no exophtalmos or lid lag, no myxedema, no xanthelasma; normal ears, nose and oropharynx Neck: normal jugular venous pulsations and no hepatojugular reflux; brisk carotid pulses without delay and no carotid bruits Chest: clear to auscultation, no signs of consolidation by percussion or palpation, normal fremitus, symmetrical and full respiratory excursions Cardiovascular: normal position and quality of the apical impulse, regular rhythm, normal first and paradoxically split second heart sounds, no murmurs, rubs or gallops.  Healthy device site. Abdomen: no tenderness or distention, no masses by palpation, no abnormal pulsatility or arterial bruits, normal bowel sounds, no hepatosplenomegaly Extremities: no clubbing, cyanosis or edema; 2+ radial, ulnar and  brachial pulses bilaterally; 2+ right femoral, posterior tibial and dorsalis pedis pulses; 2+ left femoral, posterior tibial and dorsalis pedis pulses; no subclavian or femoral bruits Neurological: grossly nonfocal Psych: Normal mood and affect   Wt Readings from Last 3 Encounters:  08/08/19 243 lb (110.2 kg)  07/08/19 244 lb 9.6 oz (110.9 kg)  06/30/19 244 lb (110.7 kg)      Studies/Labs Reviewed:   EKG:  EKG is ordered today.  It shows atrial sensed biventricular paced rhythm with a positive R wave in lead V1, QRS 160 ms, QTC 521 ms.  Recent Labs: 01/21/2019: NT-Pro BNP 929 05/31/2019: Hemoglobin 11.7; Platelets 182 06/01/2019: ALT 18; TSH 2.733 06/02/2019: B Natriuretic Peptide  333.7; BUN 51; Creatinine, Ser 2.19; Magnesium 2.1; Potassium 3.7; Sodium 138   Lipid Panel    Component Value Date/Time   CHOL 186 01/21/2019 1045   TRIG 254 (H) 01/21/2019 1045   HDL 44 01/21/2019 1045   CHOLHDL 4.2 01/21/2019 1045   CHOLHDL 4.3 10/31/2016 0314   VLDL 36 10/31/2016 0314   LDLCALC 99 01/21/2019 1045   08/18/2018 Total cholesterol 193, triglycerides 187, HDL 52, LDL 103 Hemoglobin A1c 6.9% 11/25/2018  creatinine 2.13  ASSESSMENT:    1. Chronic combined systolic and diastolic heart failure ([place])   2. Coronary artery disease of native artery of native heart with stable angina pectoris ([place])   3. CHB (complete heart block) ([facility])   4. Biventricular cardiac pacemaker battery malfunction, initial encounter   5. OSA (obstructive sleep apnea)   6. Morbid (severe) obesity due to excess calories ([place])   7. Mixed hypercholesterolemia and hypertriglyceridemia   8. Type 2 diabetes mellitus with stage 3b chronic kidney disease, without long-term current use of insulin ([facility])   9. Stage 3b chronic kidney disease      PLAN:  In order of problems listed above:  1. CHF: She appears to be euvolemic today.  She is lost a lot of real weight and we have to be constantly aware of that when we assess her dry weight.  Euvolemia demonstrated at the time of her cardiac catheterization earlier this year, when she still had some residual pulmonary hypertension..   2. CAD s/p CABG: Asymptomatic.  No angina.  All major coronary territories have adequate coronary flow via bypasses or native vessels with stents.   On lifelong dual antiplatelet therapy.  Receiving statin.  On highly selective beta-blocker due to reactive airway disease. 3. CHB: Pacemaker dependent.  The 100% biventricular pacing. 4. CRT-P: Device generator on the potential advisory due to risk of programming reset.  We will check only in the office, every 3 months.  She has unplugged her remote transmitter. .  Device is  programmed VDD. The current right atrial lead and right ventricular lead were placed in 2001. The current left ventricular lead was placed in 2008.  5. OSA: Intolerant of CPAP; pulmonary hypertension due to sleep apnea and obesity hypoventilation syndrome are likely contributing to a large degree to her shortness of breath.  She also has significant reactive airway disease and has periodically required steroid therapy. 6. Morbid obesity: Having a lot of success with her new diet.  Congratulated her and asked her to continue losing weight. 7. HLP: Despite a highly active statin, her LDL cholesterol remains above target.  We will recheck her lipid profile after her weight loss. 8. DM: Almost within target range, most recent hemoglobin A1c was 7.1%.  Triglycerides will also improve with better glycemic control. 9.  CKD 3b: Most recent creatinine was actually even higher 2.12.  Try to avoid the use of metolazone since this will also cause gout.  Etiology of her dyspnea is multifactorial, including morbid obesity and restrictive physiology, asthma, pulmonary hypertension due to obesity and obstructive sleep apnea as well as heart failure    Medication Adjustments/Labs and Tests Ordered: Current medicines are reviewed at length with the patient today.  Concerns regarding medicines are outlined above.  Medication changes, Labs and Tests ordered today are listed in the Patient Instructions below. Patient Instructions  Medication Instructions:  No changes If you need a refill on your cardiac medications before your next appointment, please call your pharmacy.   Lab work: None ordered If you have labs (blood work) drawn today and your tests are completely normal, you will receive your results only by: [place] (if you have MyChart) OR A paper copy in the mail If you have any lab test that is abnormal or we need to change your treatment, we will call you to review the results.   Testing/Procedures: None ordered  Follow-Up: At [facility], you and your health needs are our priority.  As part of our continuing mission to provide you with exceptional heart care, we have created designated Provider Care Teams.  These Care Teams include your primary Cardiologist (physician) and Advanced Practice Providers (APPs -  Physician Assistants and Nurse Practitioners) who all work together to provide you with the care you need, when you need it.  We recommend signing up for the patient portal called "MyChart".  Sign up information is provided on this After Visit Summary.  MyChart is used to connect with patients for Virtual Visits (Telemedicine).  Patients are able to view lab/test results, encounter notes, upcoming appointments, etc.  Non-urgent messages can be sent to your provider as well.   To learn more about what you can do with MyChart, go to [email or web address].    Your next appointment:   3 month(s) on a pacer day  The format for your next appointment:   In Person  Provider:   Sanda Klein, MD        Signed, Sanda Klein, MD  08/09/2019 5:33 PM    [address], [place], [place]  [address] Phone: [phone]; Fax: [phone]

## 2019-08-09 ENCOUNTER — Ambulatory Visit
Admission: RE | Admit: 2019-08-09 | Discharge: 2019-08-09 | Disposition: A | Discharge status: Home / Self Care | Payer: Medicare HMO | Source: Ambulatory Visit | Attending: Nurse Practitioner | Admitting: Nurse Practitioner

## 2019-08-09 DIAGNOSIS — N6082 Other benign mammary dysplasias of left breast: Secondary | ICD-10-CM | POA: Diagnosis not present

## 2019-08-09 DIAGNOSIS — N63 Unspecified lump in unspecified breast: Secondary | ICD-10-CM

## 2019-08-09 DIAGNOSIS — R928 Other abnormal and inconclusive findings on diagnostic imaging of breast: Secondary | ICD-10-CM | POA: Diagnosis not present

## 2019-08-10 ENCOUNTER — Other Ambulatory Visit: Payer: Self-pay | Admitting: Cardiovascular Disease

## 2019-08-25 DIAGNOSIS — I10 Essential (primary) hypertension: Secondary | ICD-10-CM | POA: Diagnosis not present

## 2019-08-25 DIAGNOSIS — N189 Chronic kidney disease, unspecified: Secondary | ICD-10-CM | POA: Diagnosis not present

## 2019-08-25 DIAGNOSIS — I2510 Atherosclerotic heart disease of native coronary artery without angina pectoris: Secondary | ICD-10-CM | POA: Diagnosis not present

## 2019-08-25 DIAGNOSIS — M109 Gout, unspecified: Secondary | ICD-10-CM | POA: Diagnosis not present

## 2019-08-25 DIAGNOSIS — Z8679 Personal history of other diseases of the circulatory system: Secondary | ICD-10-CM | POA: Diagnosis not present

## 2019-08-25 DIAGNOSIS — E119 Type 2 diabetes mellitus without complications: Secondary | ICD-10-CM | POA: Diagnosis not present

## 2019-08-25 DIAGNOSIS — M19072 Primary osteoarthritis, left ankle and foot: Secondary | ICD-10-CM | POA: Diagnosis not present

## 2019-08-26 ENCOUNTER — Other Ambulatory Visit: Payer: Self-pay | Admitting: Cardiology

## 2019-08-31 DIAGNOSIS — R2689 Other abnormalities of gait and mobility: Secondary | ICD-10-CM | POA: Diagnosis not present

## 2019-09-05 DIAGNOSIS — E119 Type 2 diabetes mellitus without complications: Secondary | ICD-10-CM | POA: Diagnosis not present

## 2019-09-05 DIAGNOSIS — I10 Essential (primary) hypertension: Secondary | ICD-10-CM | POA: Diagnosis not present

## 2019-09-05 DIAGNOSIS — N183 Chronic kidney disease, stage 3 unspecified: Secondary | ICD-10-CM | POA: Diagnosis not present

## 2019-09-05 DIAGNOSIS — N390 Urinary tract infection, site not specified: Secondary | ICD-10-CM | POA: Diagnosis not present

## 2019-09-05 DIAGNOSIS — I2510 Atherosclerotic heart disease of native coronary artery without angina pectoris: Secondary | ICD-10-CM | POA: Diagnosis not present

## 2019-09-05 DIAGNOSIS — D696 Thrombocytopenia, unspecified: Secondary | ICD-10-CM | POA: Diagnosis not present

## 2019-09-06 DIAGNOSIS — J9611 Chronic respiratory failure with hypoxia: Secondary | ICD-10-CM | POA: Diagnosis not present

## 2019-09-06 DIAGNOSIS — I5022 Chronic systolic (congestive) heart failure: Secondary | ICD-10-CM | POA: Diagnosis not present

## 2019-09-06 DIAGNOSIS — J9612 Chronic respiratory failure with hypercapnia: Secondary | ICD-10-CM | POA: Diagnosis not present

## 2019-09-13 DIAGNOSIS — R109 Unspecified abdominal pain: Secondary | ICD-10-CM | POA: Diagnosis not present

## 2019-09-17 ENCOUNTER — Other Ambulatory Visit: Payer: Self-pay | Admitting: Cardiovascular Disease

## 2019-09-19 ENCOUNTER — Encounter: Payer: Medicare HMO | Admitting: Cardiovascular Disease

## 2019-09-29 DIAGNOSIS — M1000 Idiopathic gout, unspecified site: Secondary | ICD-10-CM | POA: Diagnosis not present

## 2019-09-29 DIAGNOSIS — I509 Heart failure, unspecified: Secondary | ICD-10-CM | POA: Diagnosis not present

## 2019-09-29 DIAGNOSIS — E1122 Type 2 diabetes mellitus with diabetic chronic kidney disease: Secondary | ICD-10-CM | POA: Diagnosis not present

## 2019-09-29 DIAGNOSIS — N184 Chronic kidney disease, stage 4 (severe): Secondary | ICD-10-CM | POA: Diagnosis not present

## 2019-09-29 DIAGNOSIS — I129 Hypertensive chronic kidney disease with stage 1 through stage 4 chronic kidney disease, or unspecified chronic kidney disease: Secondary | ICD-10-CM | POA: Diagnosis not present

## 2019-10-01 DIAGNOSIS — R2689 Other abnormalities of gait and mobility: Secondary | ICD-10-CM | POA: Diagnosis not present

## 2019-10-06 DIAGNOSIS — R1319 Other dysphagia: Secondary | ICD-10-CM | POA: Diagnosis not present

## 2019-10-06 DIAGNOSIS — R490 Dysphonia: Secondary | ICD-10-CM | POA: Diagnosis not present

## 2019-10-06 IMAGING — CR CHEST - 2 VIEW
2 series · 2 of 2 positions shown · non-contrast
Comparison: May 01, 2018

CLINICAL DATA: Chest pain and tightness.

EXAM:
CHEST - 2 VIEW

[chest lat]
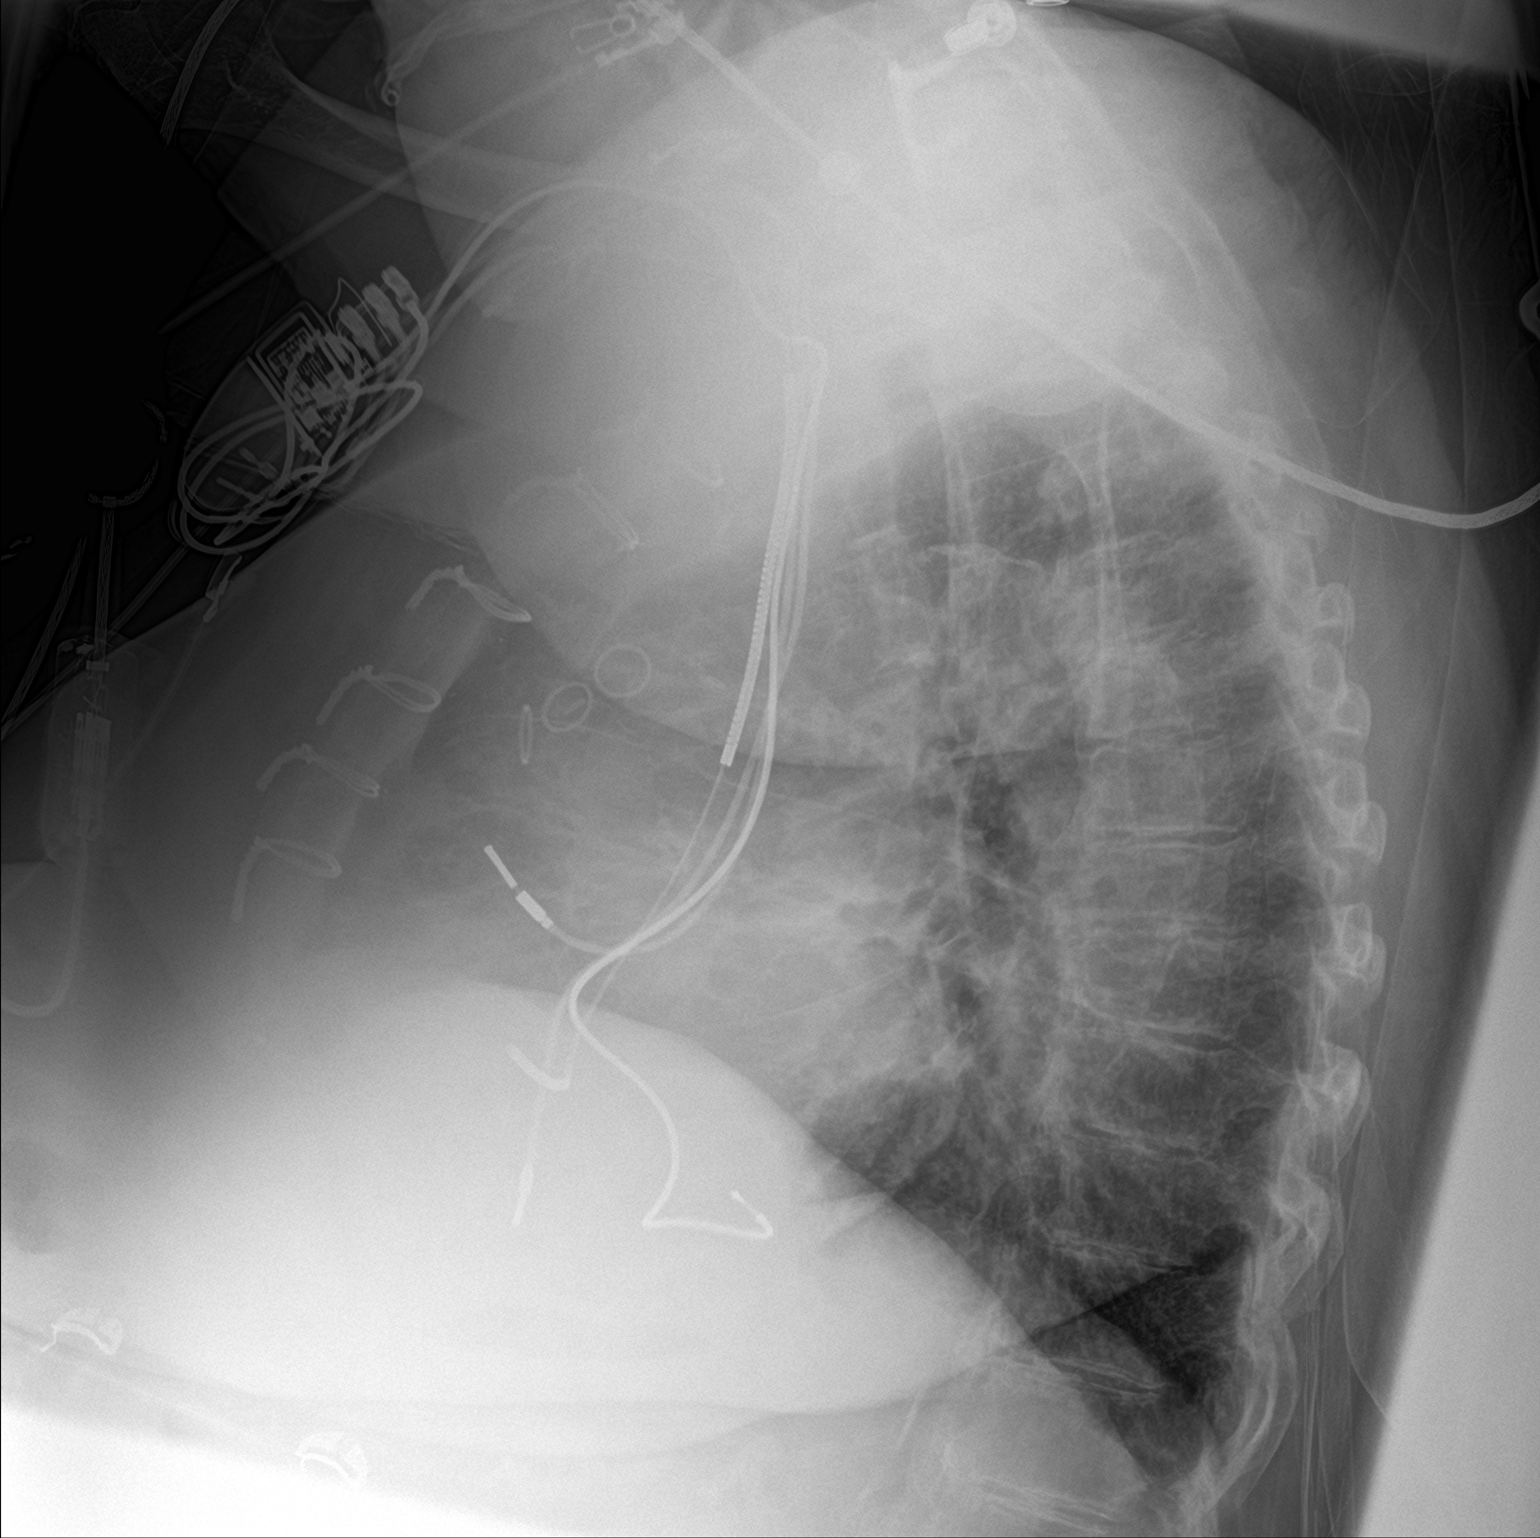

[chest ap]
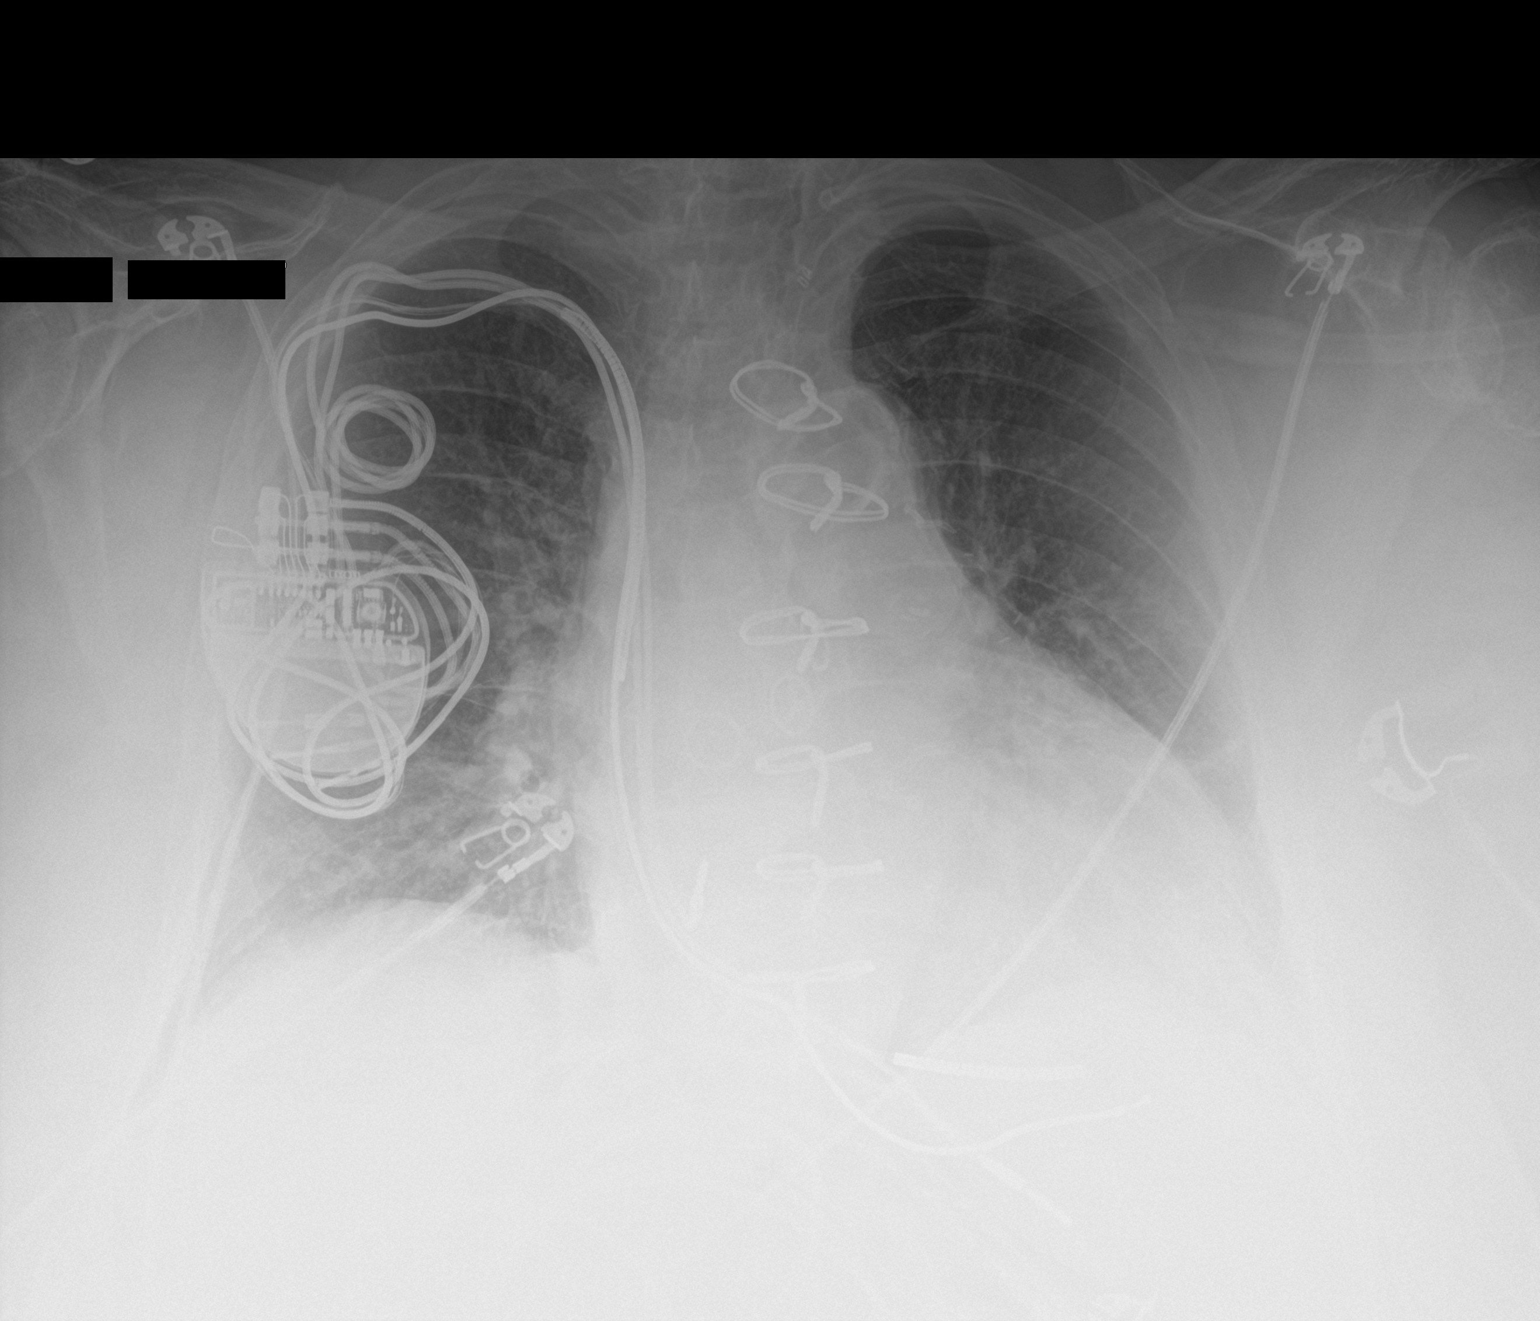

[2 of 2 positions shown; findings below may reference images not displayed]

FINDINGS: Stable AICD device. Stable cardiomegaly. The hila and mediastinum
are unchanged. The lungs are clear with no overt edema.
IMPRESSION: No active cardiopulmonary disease.

## 2019-10-07 ENCOUNTER — Other Ambulatory Visit: Payer: Self-pay | Admitting: Family Medicine

## 2019-10-07 ENCOUNTER — Telehealth: Payer: Self-pay | Admitting: Gastroenterology

## 2019-10-07 DIAGNOSIS — I5022 Chronic systolic (congestive) heart failure: Secondary | ICD-10-CM | POA: Diagnosis not present

## 2019-10-07 DIAGNOSIS — J9612 Chronic respiratory failure with hypercapnia: Secondary | ICD-10-CM | POA: Diagnosis not present

## 2019-10-07 DIAGNOSIS — J9611 Chronic respiratory failure with hypoxia: Secondary | ICD-10-CM | POA: Diagnosis not present

## 2019-10-07 DIAGNOSIS — R1319 Other dysphagia: Secondary | ICD-10-CM

## 2019-10-07 NOTE — Telephone Encounter (Signed)
Pt scheduled to see Ellouise Newer PA 10/13/19@11 :30am. Pt aware of appt.

## 2019-10-10 DIAGNOSIS — E7800 Pure hypercholesterolemia, unspecified: Secondary | ICD-10-CM | POA: Diagnosis not present

## 2019-10-10 DIAGNOSIS — I10 Essential (primary) hypertension: Secondary | ICD-10-CM | POA: Diagnosis not present

## 2019-10-10 DIAGNOSIS — J4520 Mild intermittent asthma, uncomplicated: Secondary | ICD-10-CM | POA: Diagnosis not present

## 2019-10-10 DIAGNOSIS — E1122 Type 2 diabetes mellitus with diabetic chronic kidney disease: Secondary | ICD-10-CM | POA: Diagnosis not present

## 2019-10-10 DIAGNOSIS — I5033 Acute on chronic diastolic (congestive) heart failure: Secondary | ICD-10-CM | POA: Diagnosis not present

## 2019-10-10 DIAGNOSIS — N183 Chronic kidney disease, stage 3 unspecified: Secondary | ICD-10-CM | POA: Diagnosis not present

## 2019-10-10 DIAGNOSIS — I2510 Atherosclerotic heart disease of native coronary artery without angina pectoris: Secondary | ICD-10-CM | POA: Diagnosis not present

## 2019-10-10 DIAGNOSIS — I5032 Chronic diastolic (congestive) heart failure: Secondary | ICD-10-CM | POA: Diagnosis not present

## 2019-10-10 DIAGNOSIS — E1169 Type 2 diabetes mellitus with other specified complication: Secondary | ICD-10-CM | POA: Diagnosis not present

## 2019-10-13 ENCOUNTER — Ambulatory Visit: Payer: Medicare HMO | Admitting: Physician Assistant

## 2019-10-20 ENCOUNTER — Other Ambulatory Visit: Payer: Medicare HMO

## 2019-10-23 IMAGING — CT CT HEAD W/O CM
1 series · 16 of 30 positions shown, 20 images · non-contrast
Comparison: 03/04/2016

CLINICAL DATA: Fall, sinus pain and pressure

EXAM:
CT HEAD WITHOUT CONTRAST
TECHNIQUE: Contiguous axial images were obtained from the base of the skull
through the vertex without intravenous contrast.

[Series 2: head w/(date) · axial · 0.45mm/px · z∈[-174,-18]mm · 16 of 35 slices shown, 20 images]
[im 2/35  brain]
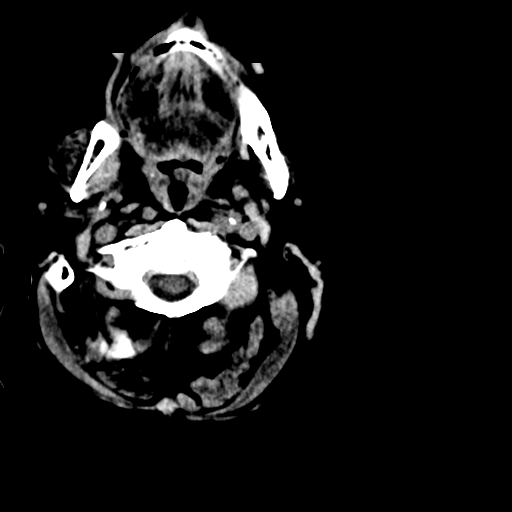
[im 2/35  bone]
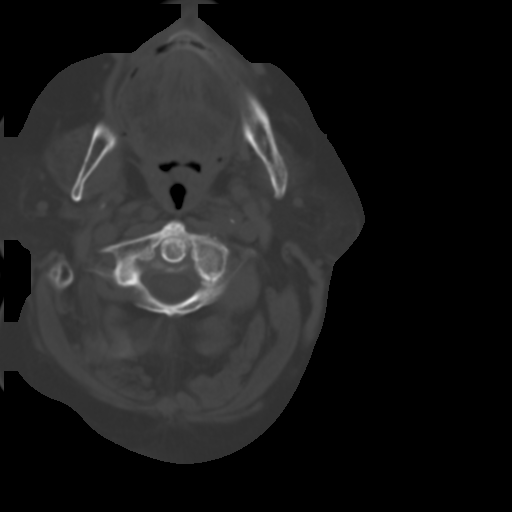
[im 4/35  brain]
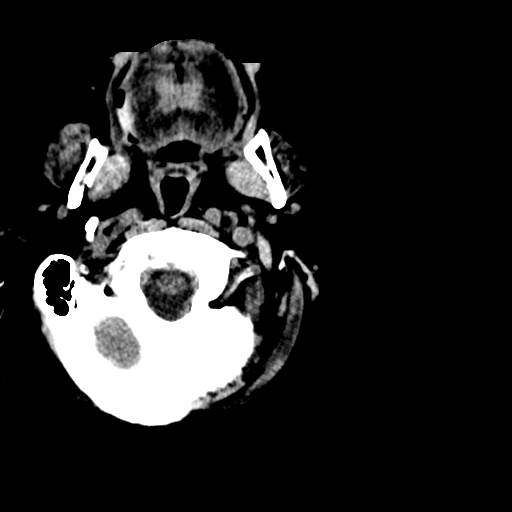
[im 6/35  brain]
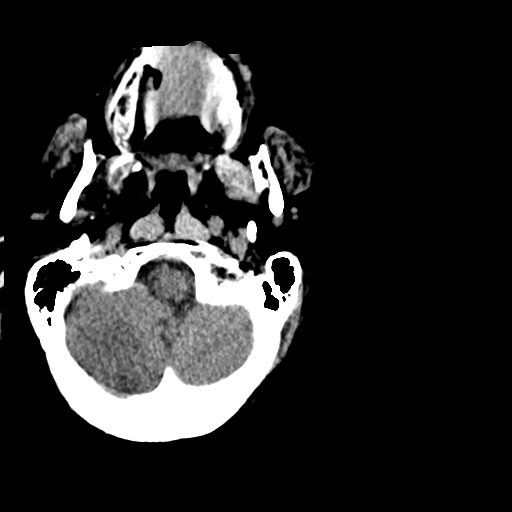
[im 9/35  brain]
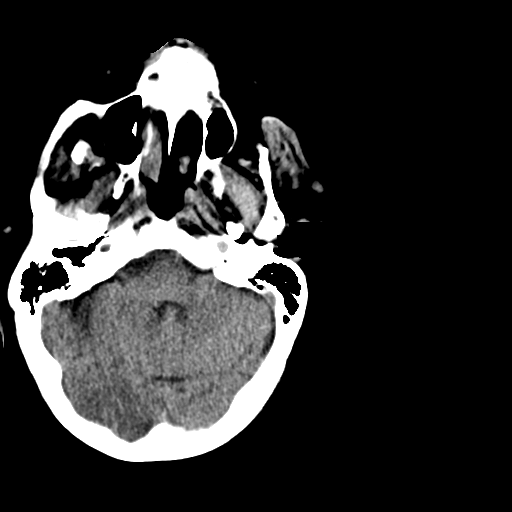
[im 10/35  brain]
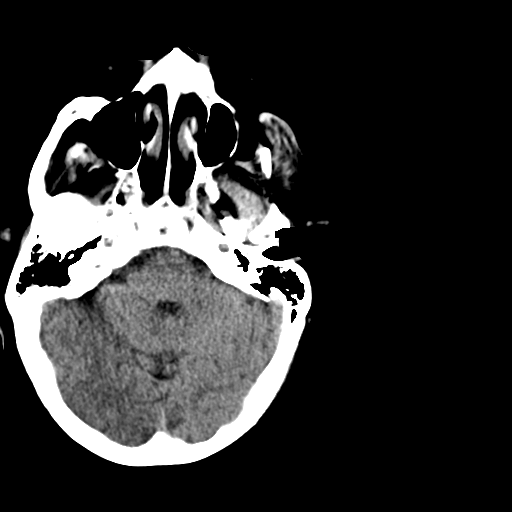
[im 10/35  bone]
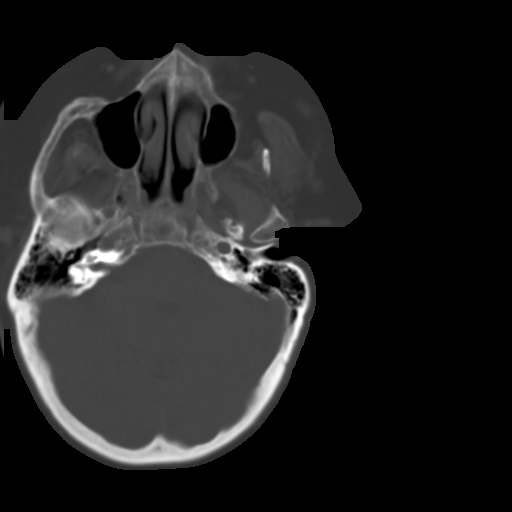
[im 12/35  brain]
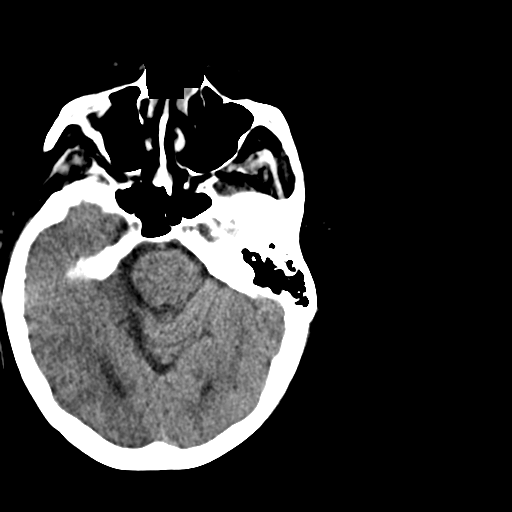
[im 15/35  brain]
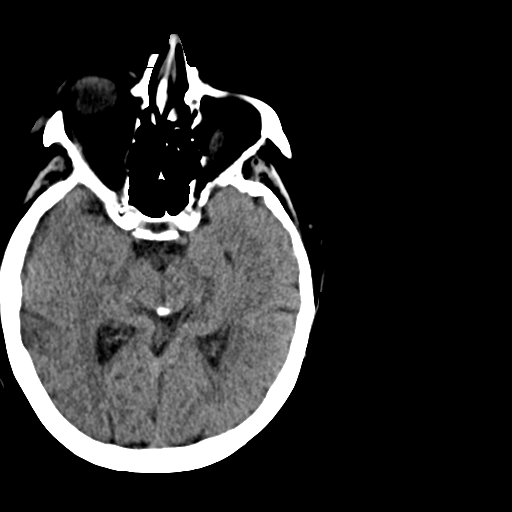
[im 17/35  brain]
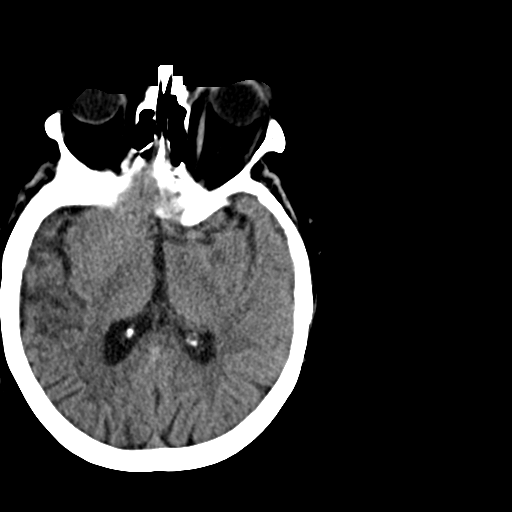
[im 18/35  brain]
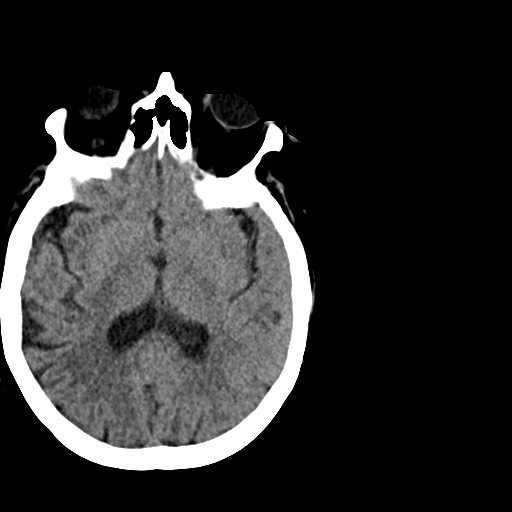
[im 18/35  bone]
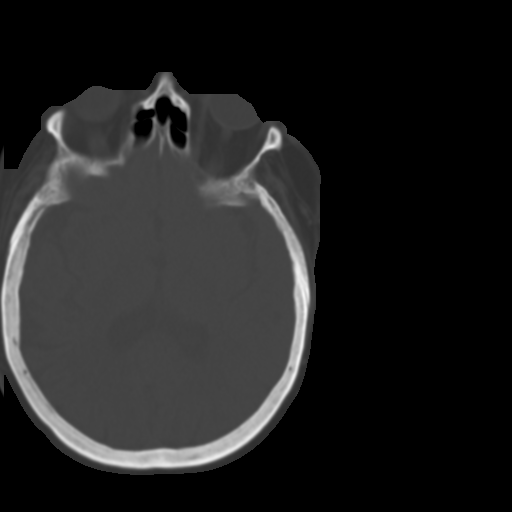
[im 20/35  brain]
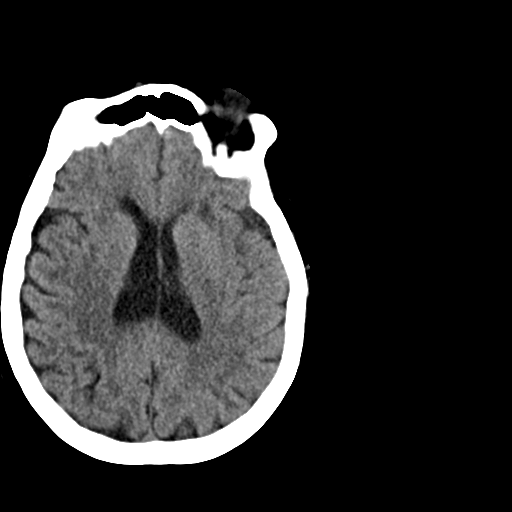
[im 23/35  brain]
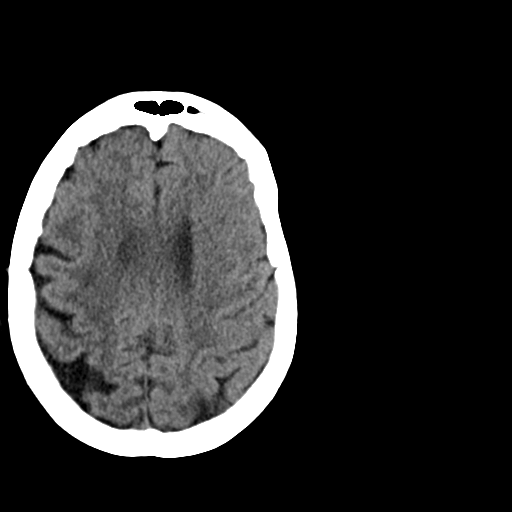
[im 25/35  brain]
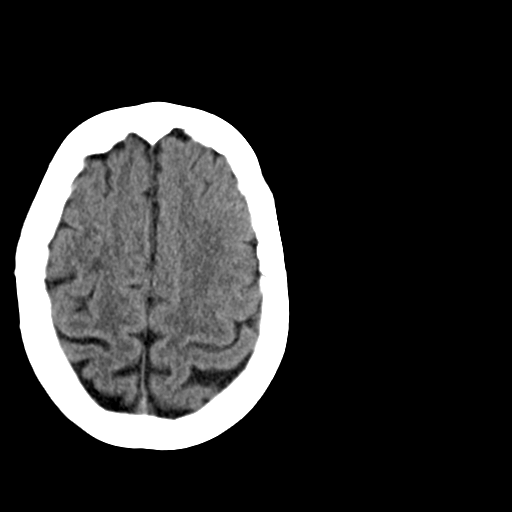
[im 26/35  brain]
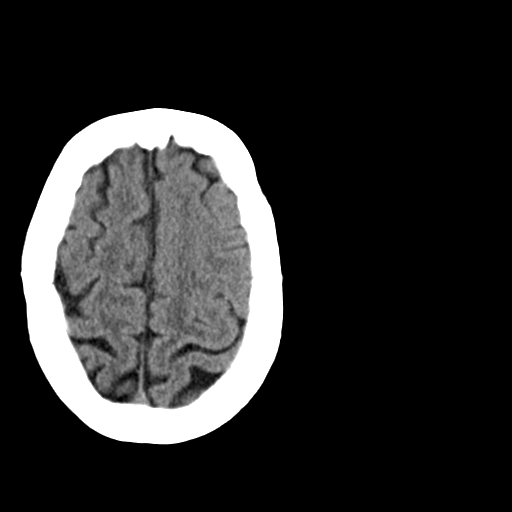
[im 26/35  bone]
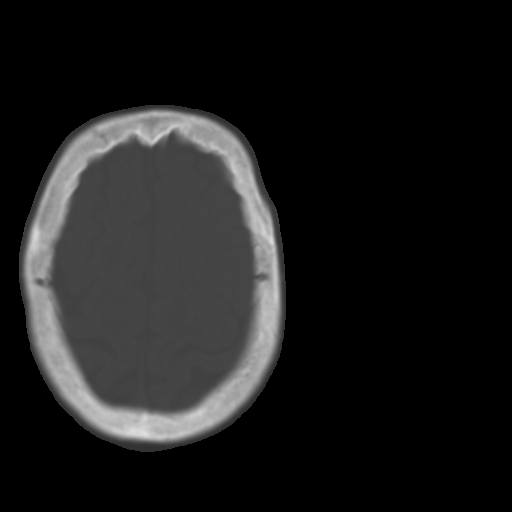
[im 29/35  brain]
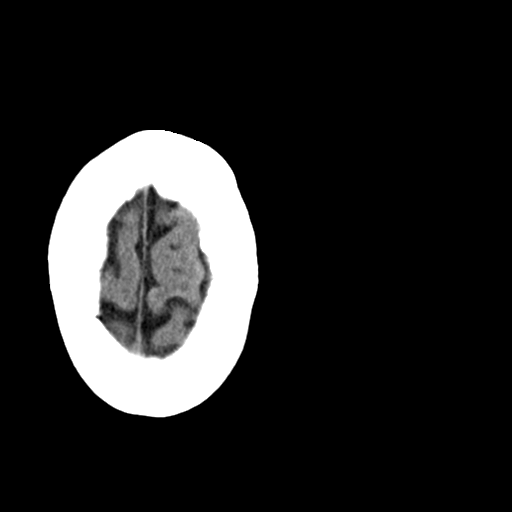
[im 31/35  brain]
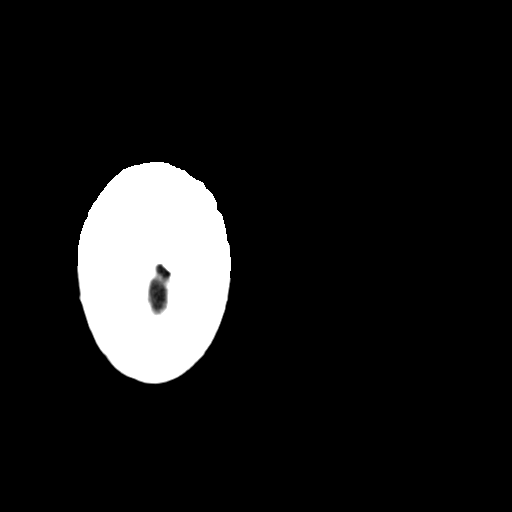
[im 33/35  brain]
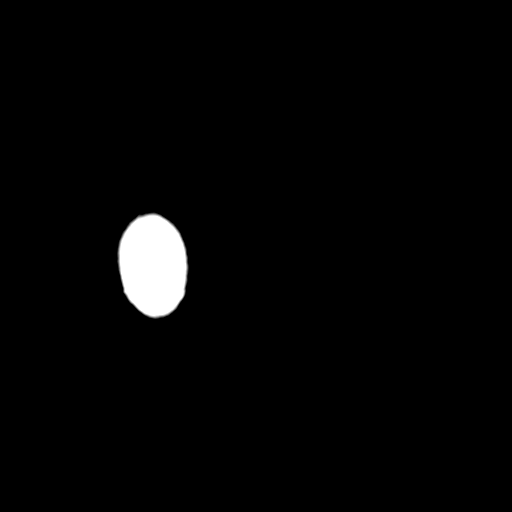

[16 of 30 positions shown; findings below may reference images not displayed]

FINDINGS: Brain: No evidence of acute infarction, hemorrhage, hydrocephalus,
extra-axial collection or mass lesion/mass effect. Periventricular
white matter hypodensity.

Vascular: No hyperdense vessel or unexpected calcification.

Skull: Normal. Negative for fracture or focal lesion.

Sinuses/Orbits: No acute finding.

Other: None.
IMPRESSION: 1. No acute intracranial pathology. Small-vessel white matter
disease.

2.  No abnormality of the paranasal sinuses as included.

## 2019-10-31 IMAGING — MG MM DIGITAL SCREENING BILAT W/ TOMO W/ CAD
6 of 12 series · 6 of 36 positions shown · non-contrast
Comparison: Previous exam(s).

ACR Breast Density Category a: The breast tissue is almost entirely
fatty.

CLINICAL DATA: Screening.

EXAM:
DIGITAL SCREENING BILATERAL MAMMOGRAM WITH TOMO AND CAD

[L MLO synth-2D (1 of 2)]
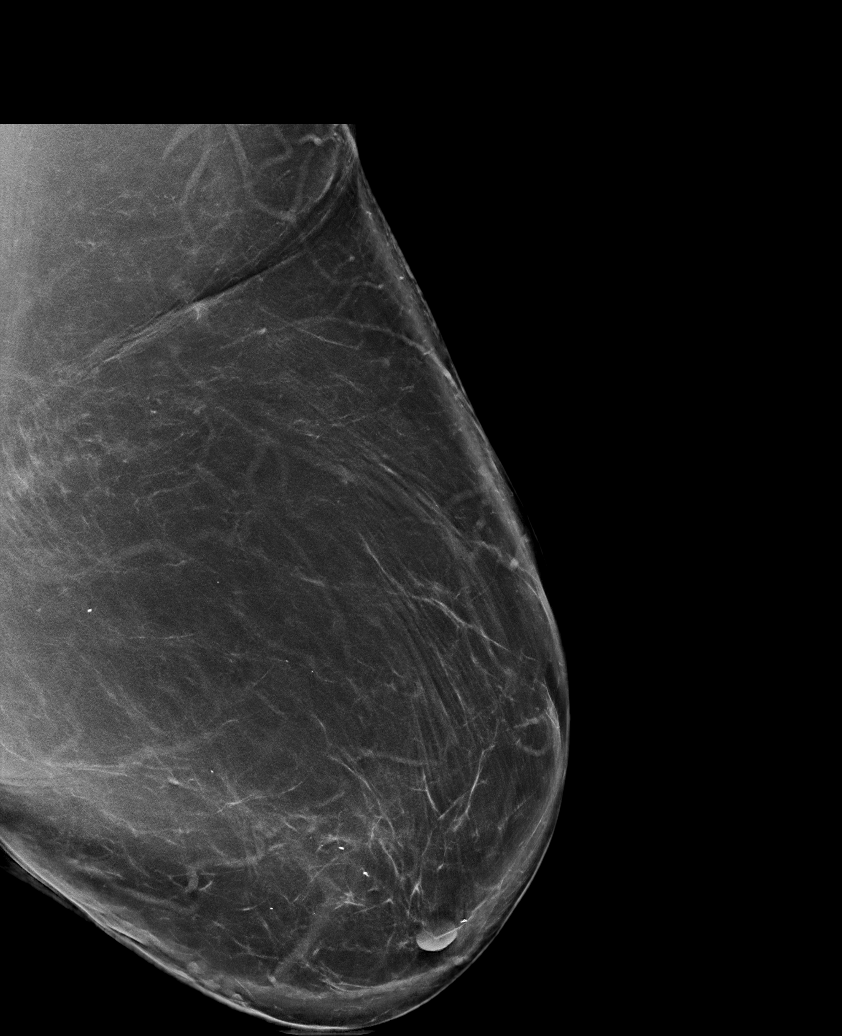

[L MLO synth-2D (2 of 2)]
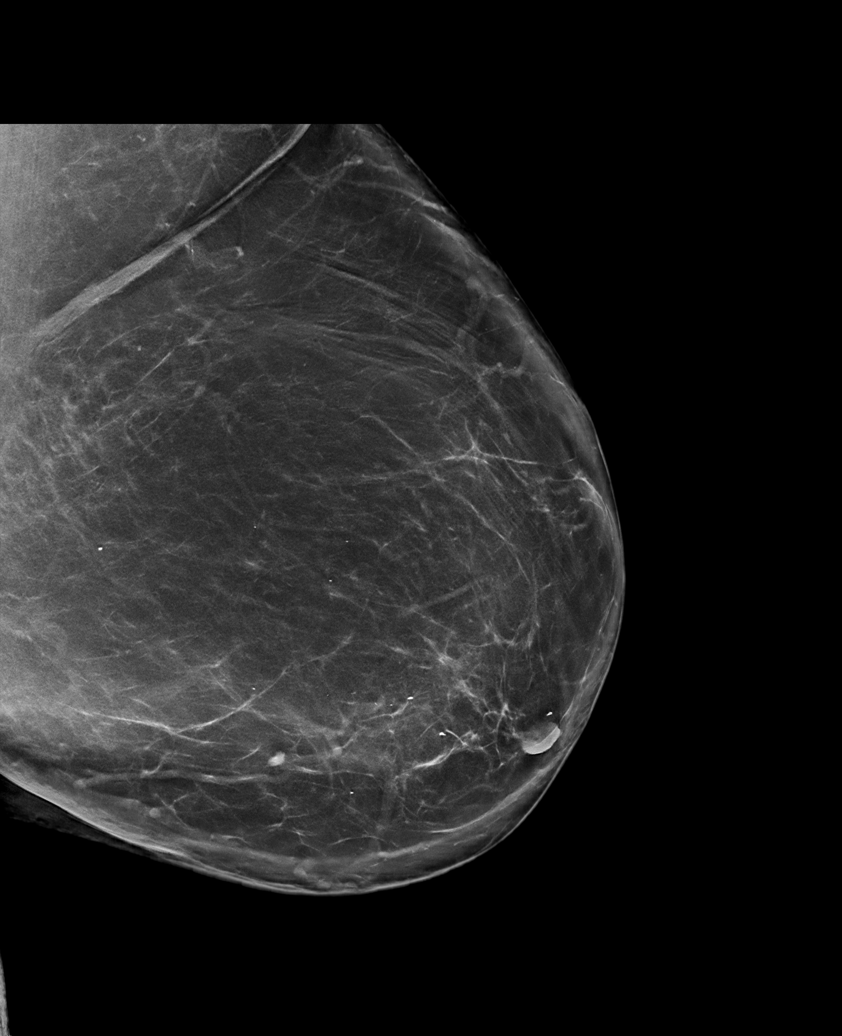

[R CC synth-2D]
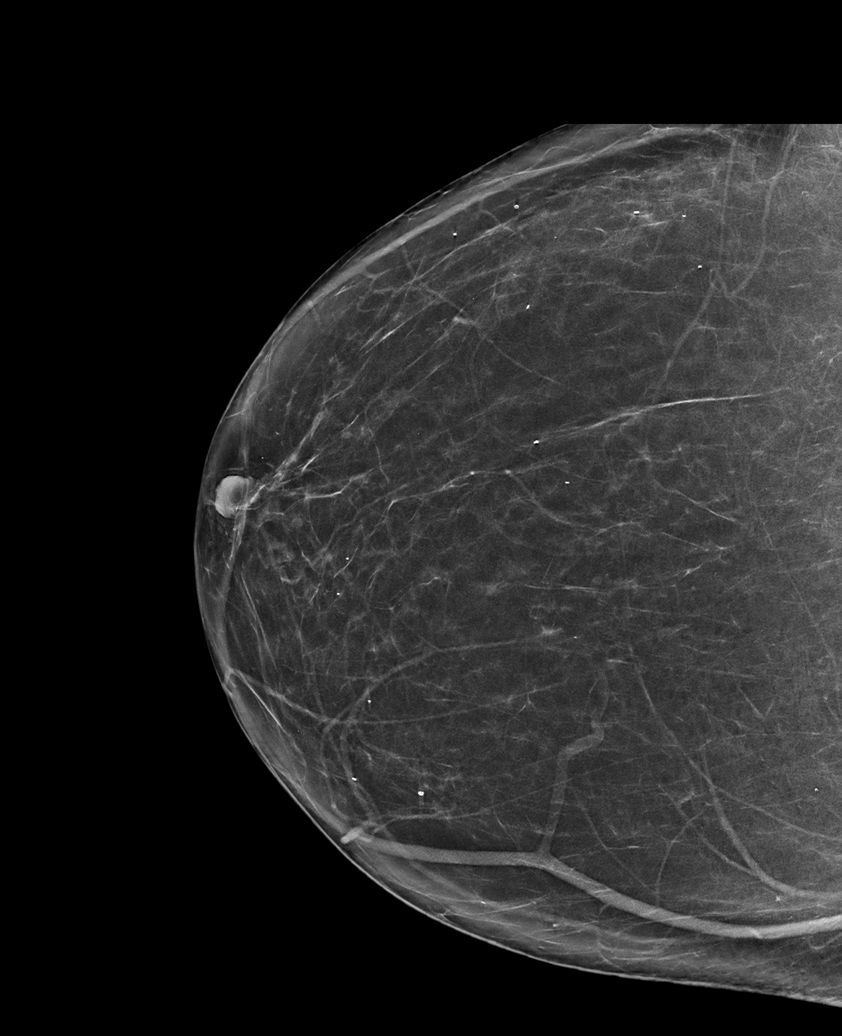

[L CC synth-2D]
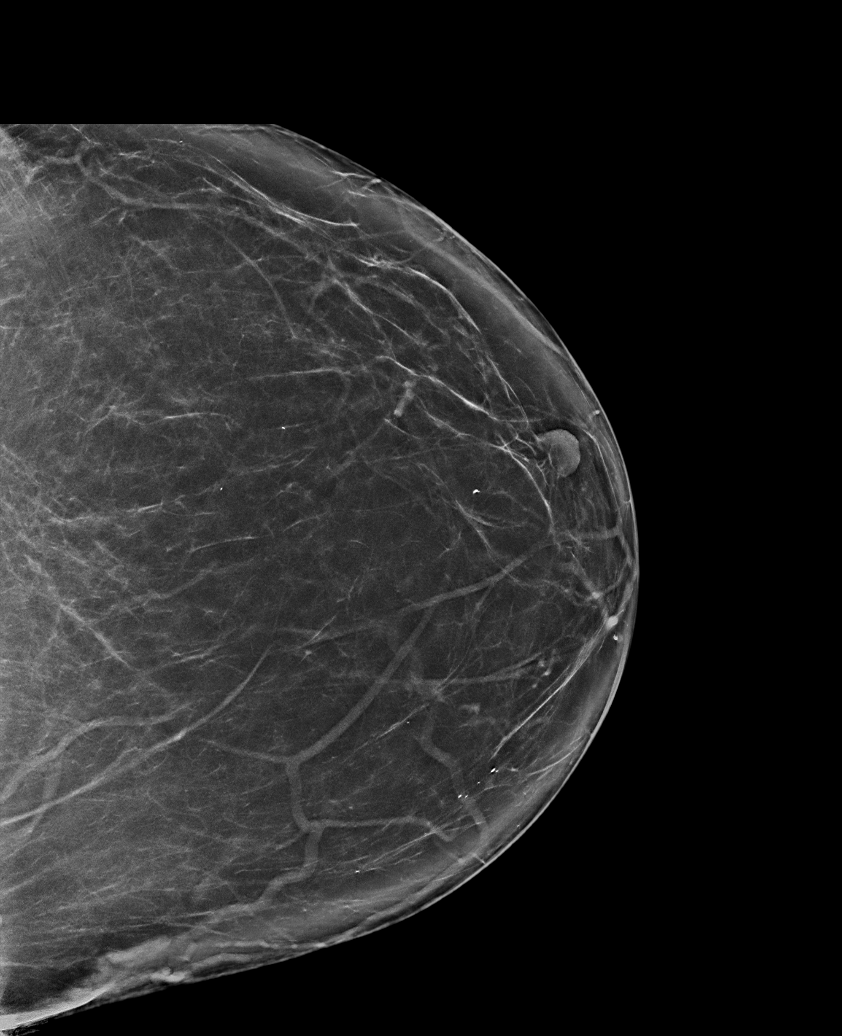

[R MLO synth-2D (1 of 2)]
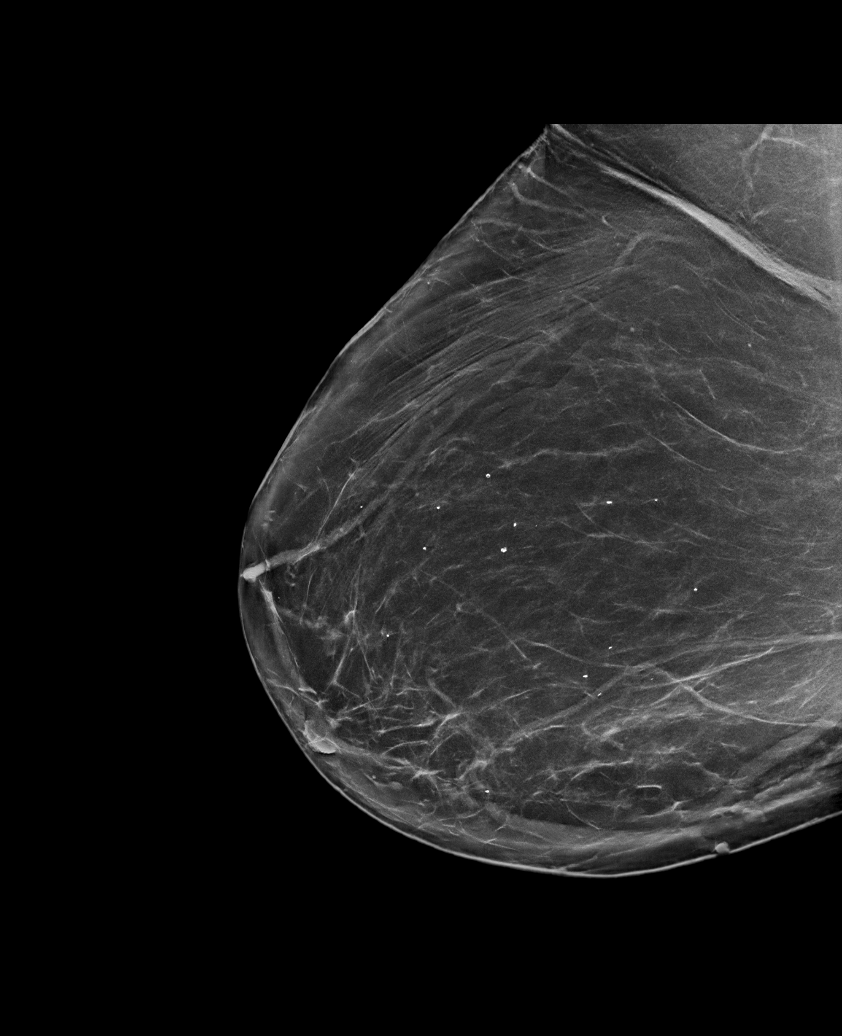

[R MLO synth-2D (2 of 2)]
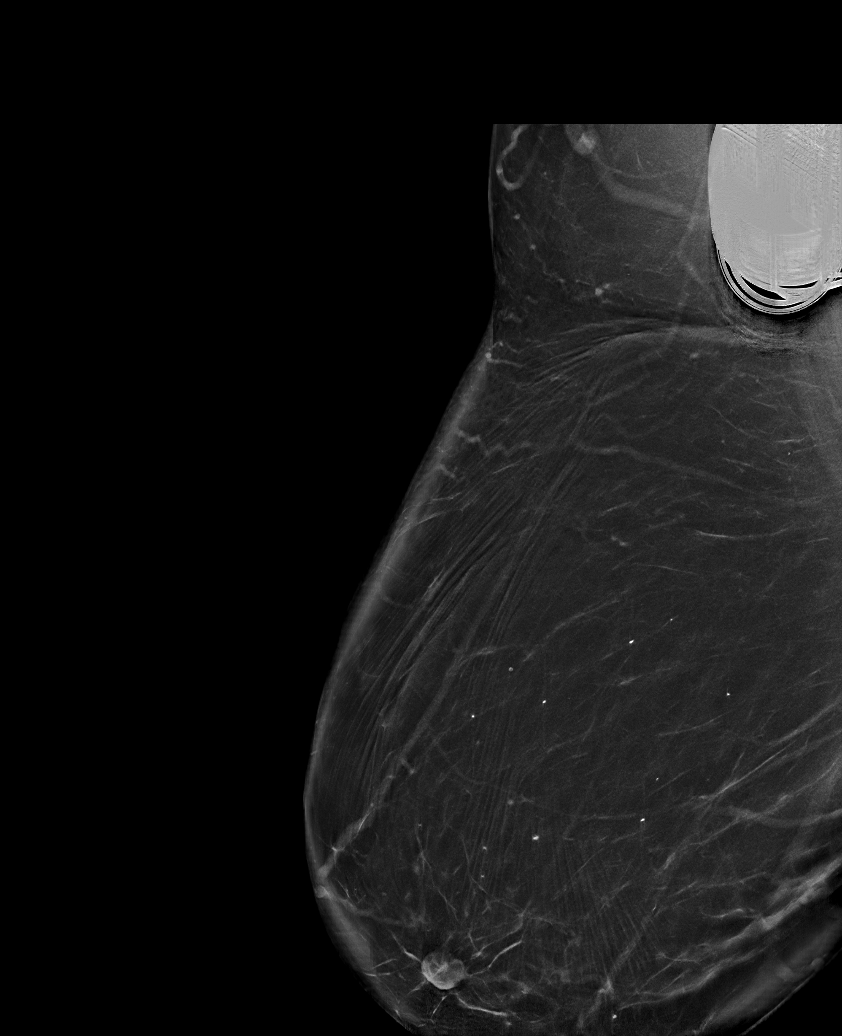

[6 of 36 positions shown; findings below may reference images not displayed]

FINDINGS: There are no findings suspicious for malignancy. Images were
processed with CAD.
IMPRESSION: No mammographic evidence of malignancy. A result letter of this
screening mammogram will be mailed directly to the patient.

RECOMMENDATION:
Screening mammogram in one year. (Code:[id number]-[id number])

BI-RADS CATEGORY  1: Negative.

## 2019-11-01 ENCOUNTER — Other Ambulatory Visit: Payer: Medicare HMO

## 2019-11-01 DIAGNOSIS — R2689 Other abnormalities of gait and mobility: Secondary | ICD-10-CM | POA: Diagnosis not present

## 2019-11-04 ENCOUNTER — Ambulatory Visit
Admission: RE | Admit: 2019-11-04 | Discharge: 2019-11-04 | Disposition: A | Discharge status: Home / Self Care | Payer: Medicare HMO | Source: Ambulatory Visit | Attending: Family Medicine | Admitting: Family Medicine

## 2019-11-04 DIAGNOSIS — R1319 Other dysphagia: Secondary | ICD-10-CM

## 2019-11-04 DIAGNOSIS — K225 Diverticulum of esophagus, acquired: Secondary | ICD-10-CM | POA: Diagnosis not present

## 2019-11-07 DIAGNOSIS — J9612 Chronic respiratory failure with hypercapnia: Secondary | ICD-10-CM | POA: Diagnosis not present

## 2019-11-07 DIAGNOSIS — J9611 Chronic respiratory failure with hypoxia: Secondary | ICD-10-CM | POA: Diagnosis not present

## 2019-11-07 DIAGNOSIS — I5022 Chronic systolic (congestive) heart failure: Secondary | ICD-10-CM | POA: Diagnosis not present

## 2019-11-26 ENCOUNTER — Ambulatory Visit: Payer: Medicare HMO

## 2019-11-29 ENCOUNTER — Other Ambulatory Visit: Payer: Self-pay

## 2019-11-29 MED ORDER — SPIRONOLACTONE 25 MG PO TABS
25.0000 mg | ORAL_TABLET | Freq: Every day | ORAL | 3 refills | Status: DC
Start: 2019-11-29 — End: 2020-04-25

## 2019-11-29 MED ORDER — ROSUVASTATIN CALCIUM 20 MG PO TABS
20.0000 mg | ORAL_TABLET | Freq: Every day | ORAL | 3 refills | Status: DC
Start: 2019-11-29 — End: 2020-08-17

## 2019-12-01 ENCOUNTER — Other Ambulatory Visit: Payer: Self-pay | Admitting: Cardiovascular Disease

## 2019-12-02 ENCOUNTER — Other Ambulatory Visit: Payer: Self-pay

## 2019-12-02 IMAGING — US US ABDOMEN COMPLETE
1 series · 13 of 25 positions shown · non-contrast
Comparison: CT abdomen and pelvis November 20, 2017

CLINICAL DATA: Abdominal pain

EXAM:
ABDOMEN ULTRASOUND COMPLETE

[Series 1: us abdomen complete · 0.20mm/px · 13 of 86 slices shown]
[im 1/86]
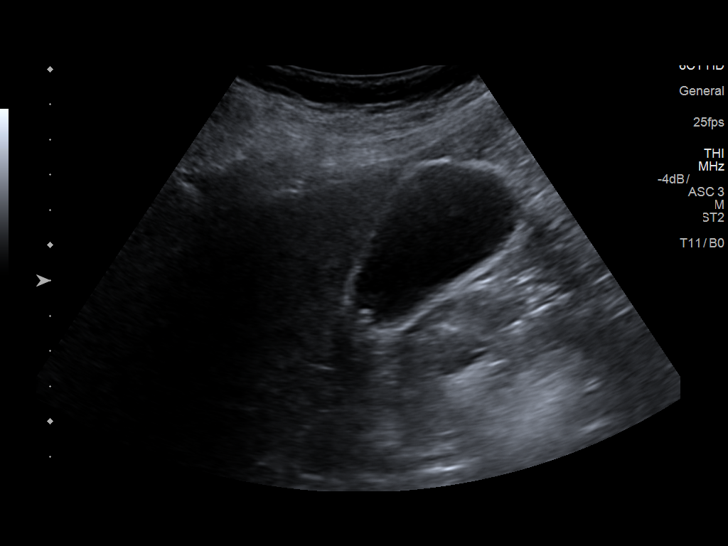
[im 8/86]
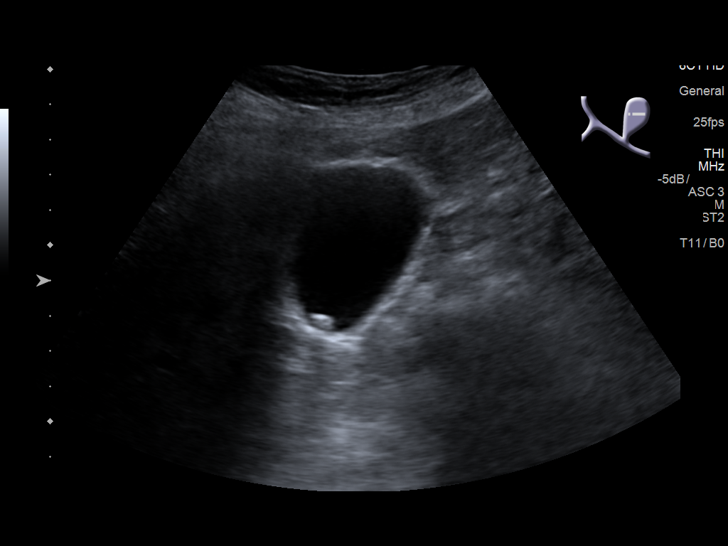
[im 15/86]
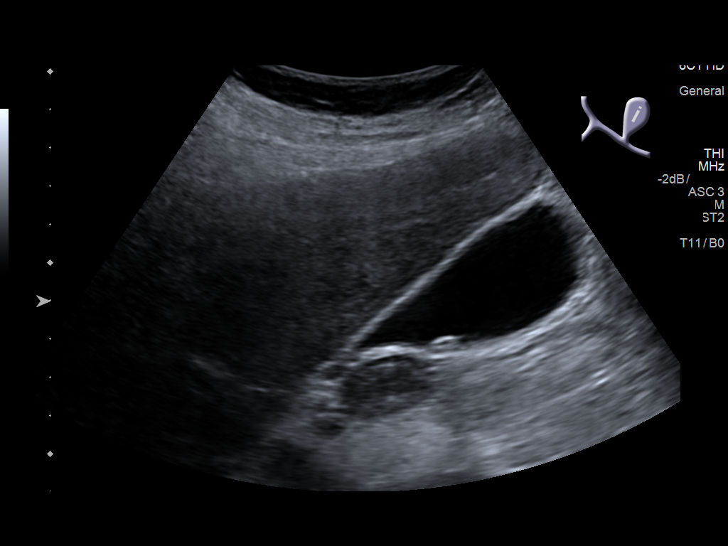
[im 22/86]
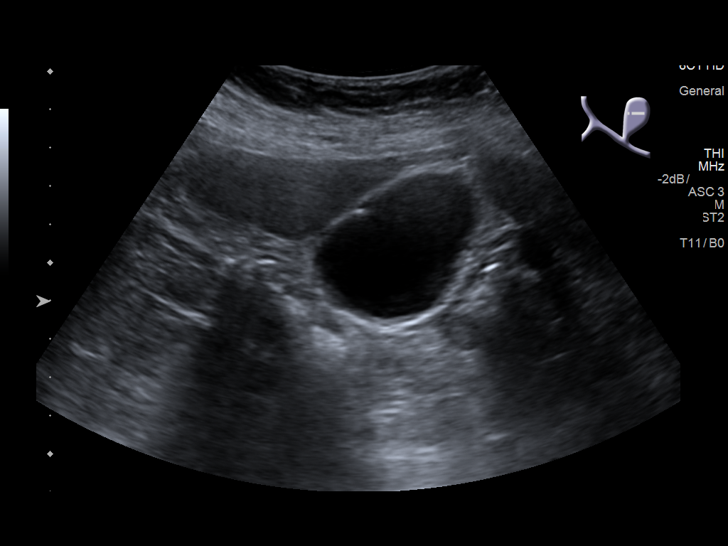
[im 29/86]
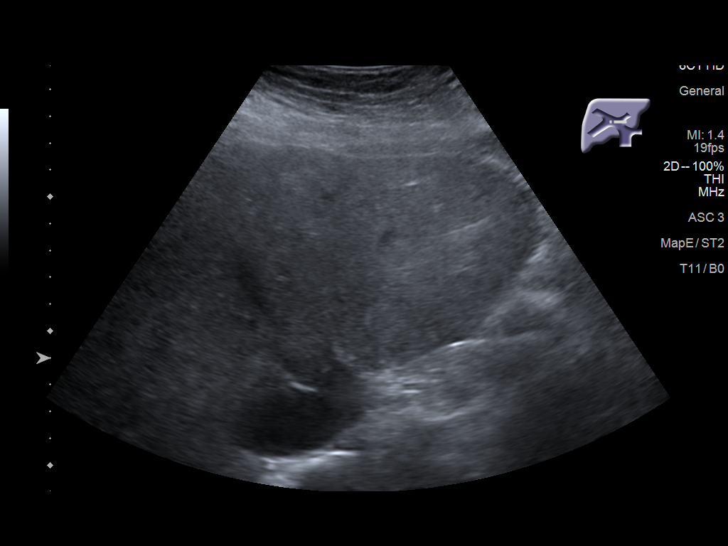
[im 36/86]
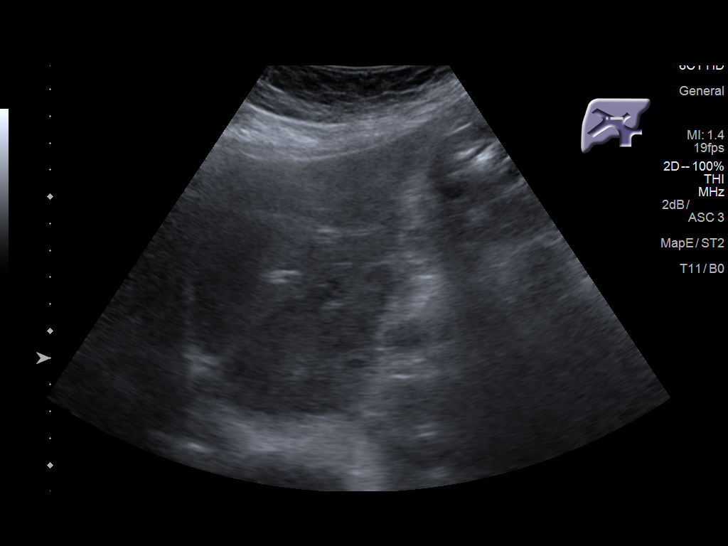
[im 43/86]
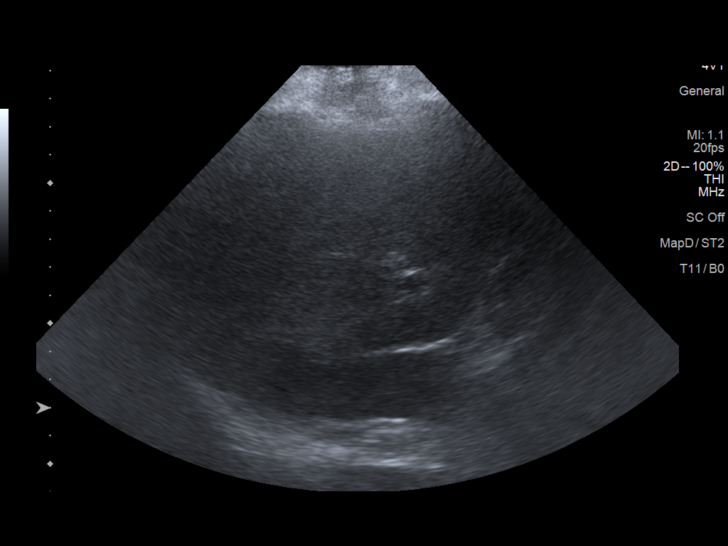
[im 50/86]
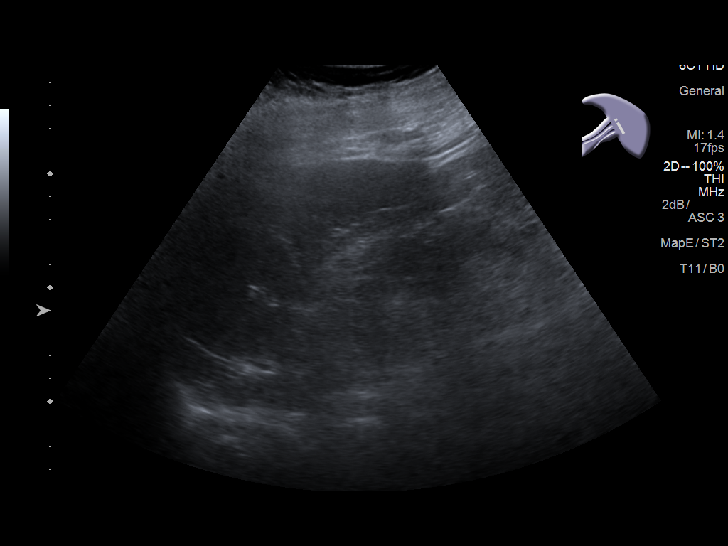
[im 57/86]
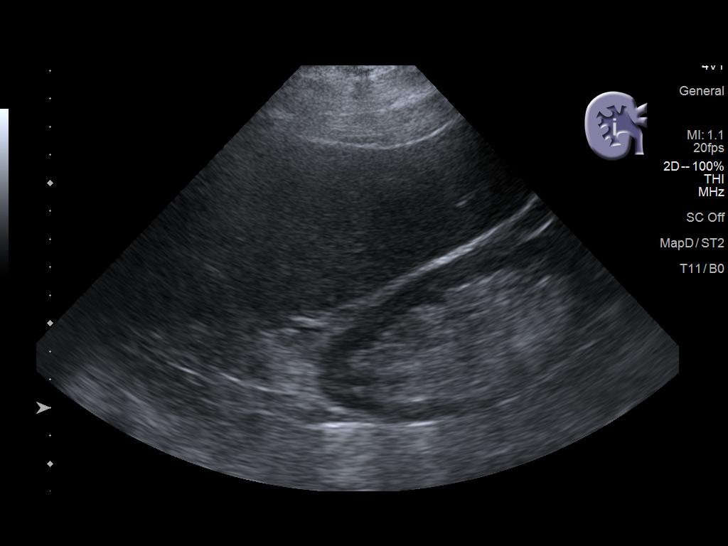
[im 64/86]
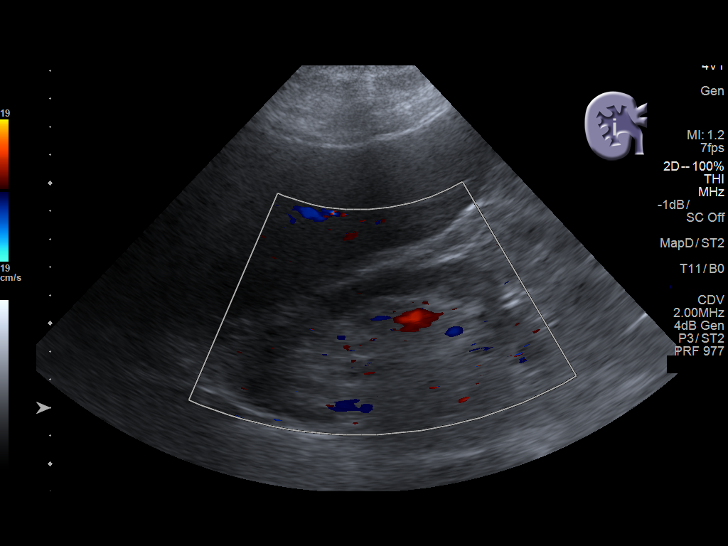
[im 71/86]
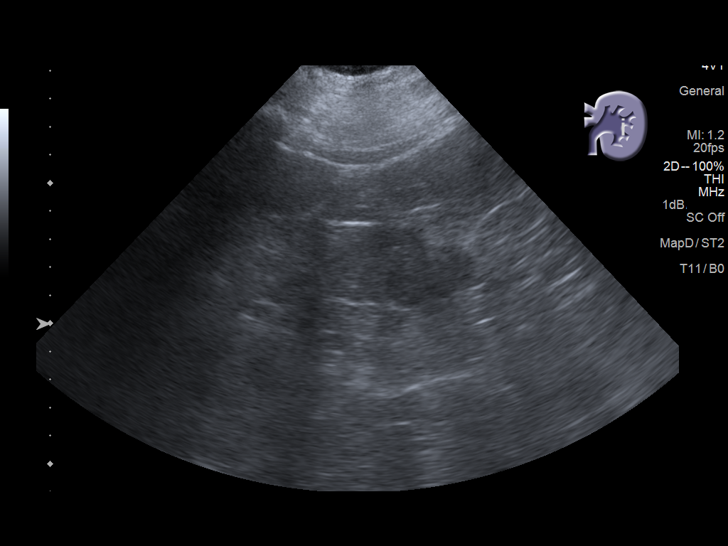
[im 78/86]
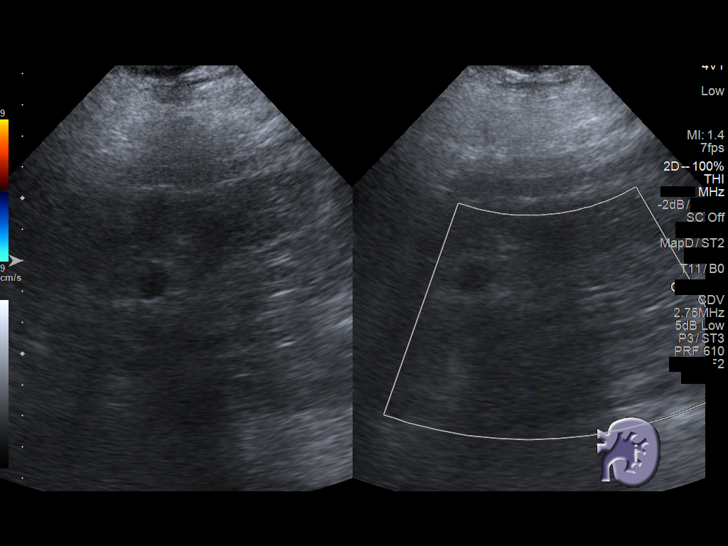
[im 86/86]
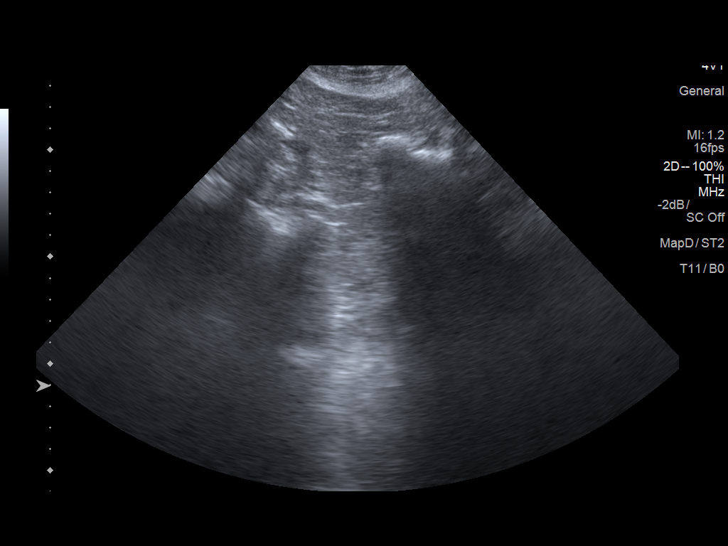

[13 of 25 positions shown; findings below may reference images not displayed]

FINDINGS: Gallbladder: Within the gallbladder, there is an echogenic focus
measuring 9 mm which moves and shadows consistent with
cholelithiasis. There is also a 3 mm nonmoving structure felt to
represent a small polyp. There is no gallbladder wall thickening or
pericholecystic fluid.. No sonographic Murphy sign noted by
sonographer.

Common bile duct: Diameter: 5 mm. No intrahepatic, common hepatic,
or common bile duct dilatation.

Liver: No focal lesion identified. Liver echogenicity overall is
increased.. Portal vein is patent on color Doppler imaging with
normal direction of blood flow towards the liver.

IVC: No abnormality visualized.

Pancreas: Visualized portion unremarkable. Portions of pancreas
obscured by gas.

Spleen: Size and appearance within normal limits.

Right Kidney: Length: 11.2 cm. Echogenicity within normal limits.
There is renal cortical thinning. No mass or hydronephrosis
visualized.

Left Kidney: Length: 11.2 cm. Echogenicity within normal limits.
There is borderline renal cortical thinning. No mass or
hydronephrosis visualized.

Abdominal aorta: No aneurysm visualized.

Other findings: . no demonstrable ascites.
IMPRESSION: 1. Cholelithiasis. There is also a 3 mm polyp in the gallbladder. No
gallbladder wall thickening or pericholecystic fluid.

2. Portions of pancreas obscured by gas. Visualized portions of
pancreas appear normal.

3. There is bilateral renal cortical thinning, a finding that may be
a function of age but also may be a function of a degree of medical
renal disease. Note that the renal echogenicity is unremarkable. No
obstructing focus in either kidney.

4.  Study otherwise unremarkable.

## 2019-12-02 MED ORDER — ALLOPURINOL 100 MG PO TABS
100.0000 mg | ORAL_TABLET | Freq: Every day | ORAL | 7 refills | Status: DC
Start: 2019-12-02 — End: 2020-04-23

## 2019-12-02 MED ORDER — RANOLAZINE ER 1000 MG PO TB12: 1000.0000 mg | ORAL_TABLET | Freq: Two times a day (BID) | ORAL | 2 refills | Status: DC

## 2019-12-10 ENCOUNTER — Ambulatory Visit: Payer: Medicare Other

## 2019-12-12 ENCOUNTER — Other Ambulatory Visit: Payer: Self-pay | Admitting: Family Medicine

## 2019-12-12 DIAGNOSIS — E2839 Other primary ovarian failure: Secondary | ICD-10-CM

## 2019-12-15 ENCOUNTER — Ambulatory Visit (INDEPENDENT_AMBULATORY_CARE_PROVIDER_SITE_OTHER): Payer: Medicare Other | Admitting: Sports Medicine

## 2019-12-15 ENCOUNTER — Encounter: Payer: Self-pay | Admitting: Sports Medicine

## 2019-12-15 ENCOUNTER — Other Ambulatory Visit: Payer: Self-pay

## 2019-12-15 DIAGNOSIS — M79674 Pain in right toe(s): Secondary | ICD-10-CM | POA: Diagnosis not present

## 2019-12-15 DIAGNOSIS — M2041 Other hammer toe(s) (acquired), right foot: Secondary | ICD-10-CM

## 2019-12-15 DIAGNOSIS — B351 Tinea unguium: Secondary | ICD-10-CM | POA: Diagnosis not present

## 2019-12-15 DIAGNOSIS — M79675 Pain in left toe(s): Secondary | ICD-10-CM | POA: Diagnosis not present

## 2019-12-15 DIAGNOSIS — M2042 Other hammer toe(s) (acquired), left foot: Secondary | ICD-10-CM

## 2019-12-15 DIAGNOSIS — E1142 Type 2 diabetes mellitus with diabetic polyneuropathy: Secondary | ICD-10-CM

## 2019-12-15 DIAGNOSIS — S90426A Blister (nonthermal), unspecified lesser toe(s), initial encounter: Secondary | ICD-10-CM

## 2019-12-15 NOTE — Progress Notes (Signed)
Subjective: [name] is a 74 y.o. female patient with history of diabetes who presents to office today complaining of long,mildly painful nails  while ambulating in shoes; unable to trim. Admits to dry skin and a dry blood blister at the right 4th toe and left 2nd toe that has fallen off thinks from grandkid stepping on toe but no pain. Patient states that the glucose reading this morning was not recorded. Admits also some tingling to toes.   Review of Systems  All other systems reviewed and are negative.    Patient Active Problem List   Diagnosis Date Noted  . Elevated troponin 06/01/2019  . Constipation 10/21/2018  . Chronic headaches 07/30/2018  . Congestive heart failure (CHF) ([place]) 07/30/2018  . Supplemental oxygen dependent 07/30/2018  . Leukopenia 05/03/2018  . Thrombocytopenia ([place]) 05/03/2018  . Pneumonia due to human metapneumovirus 05/02/2018  . Chest pain 05/01/2018  . Allergic rhinitis 04/13/2018  . S/P laparoscopic cholecystectomy 04/09/2018  . Chronic respiratory failure with hypoxia and hypercapnia ([place]) 03/07/2018  . Polyp of cecum   . Polyp of transverse colon   . Positive colorectal cancer screening using Cologuard test   . Dysphagia   . Acute on chronic renal failure ([employer]) 11/20/2017  . CAD (coronary artery disease) 11/20/2017  . DM2 (diabetes mellitus, type 2) ([place]) 11/20/2017  . Abdominal pain, epigastric 11/20/2017  . Vasomotor rhinitis 10/16/2016  . Right facial numbness 03/05/2016  . CKD (chronic kidney disease), stage III ([place]) 03/05/2016  . Atrial fibrillation ([place]) 10/24/2015  . Chest pain with moderate risk of acute coronary syndrome 10/23/2015  . History of TIA (transient ischemic attack) 10/22/2015  . Complete heart block ([place]) 06/22/2015  . Allergic drug rash due to anti-infective agent 04/29/2015  . Fatigue 02/14/2015  . Chronic diastolic CHF (congestive heart failure) ([place]) 02/14/2015  . Cellulitis 12/21/2014  . S/P CABG x 5  11/29/2014  . CAD S/P percutaneous coronary angioplasty   . Diarrhea 07/12/2014  . Generalized abdominal pain 07/12/2014  . Diastolic CHF, acute on chronic (HCC) 11/04/2013  . Mixed hypercholesterolemia and hypertriglyceridemia 04/22/2013  . Vertigo 04/22/2013  . Dyspnea on exertion 08/25/2012  . Allergic to IV contrast 07/23/2012  . Unstable angina ([place]) 07/22/2012  . Morbid (severe) obesity due to excess calories ([place]) 11/22/2011  . Gastroesophageal reflux disease 11/22/2011  . Back pain 11/22/2011  . OSA (obstructive sleep apnea) 11/22/2011  . HEMORRHOIDS-EXTERNAL 02/21/2010  . NAUSEA 02/21/2010  . ABDOMINAL PAIN -GENERALIZED 02/21/2010  . PERSONAL HX COLONIC POLYPS 02/21/2010  . ANEMIA 04/16/2007  . Essential hypertension 04/16/2007  . DIVERTICULOSIS, COLON 04/16/2007  . ARTHRITIS 04/16/2007  . INTERNAL HEMORRHOIDS WITHOUT MENTION COMP 04/12/2007  . Asthma 04/12/2007  . Cardiac resynchronization therapy pacemaker (CRT-P) in place 04/12/2007  . Gastritis without bleeding 12/14/2006  . COLONIC POLYPS, HYPERPLASTIC 09/27/2002  . FATTY LIVER DISEASE 02/16/2002   Current Outpatient Medications on File Prior to Visit  Medication Sig Dispense Refill  . ACCU-CHEK AVIVA PLUS test strip     . Accu-Chek Softclix Lancets lancets     . acetaminophen (TYLENOL) 500 MG tablet Take 500-1,000 mg by mouth every 12 (twelve) hours as needed (for pain or headaches).     [name] albuterol (PROVENTIL HFA;VENTOLIN HFA) 108 (90 Base) MCG/ACT inhaler Inhale 2 puffs into the lungs every 6 (six) hours as needed for wheezing or shortness of breath. 1 Inhaler 0  . albuterol (PROVENTIL) (2.5 MG/3ML) 0.083% nebulizer solution Take 3 mLs (2.5 mg total) by nebulization every  6 (six) hours as needed for wheezing or shortness of breath. 75 mL 0  . Alcohol Swabs (B-D SINGLE USE SWABS REGULAR) PADS     . allopurinol (ZYLOPRIM) 100 MG tablet Take 1 tablet (100 mg total) by mouth daily. 30 tablet 7  . aspirin EC 81 MG  tablet Take 1 tablet (81 mg total) by mouth daily. 90 tablet 3  . Biotin 1000 MCG tablet Take 1,000 mcg by mouth daily.    . bisoprolol (ZEBETA) 5 MG tablet Take 5 mg by mouth daily.    . budesonide-formoterol (SYMBICORT) 160-4.5 MCG/ACT inhaler Inhale 2 puffs into the lungs 2 (two) times daily.    . cetirizine (ZYRTEC) 10 MG tablet Take 10 mg by mouth daily.     . clopidogrel (PLAVIX) 75 MG tablet Take 75 mg by mouth daily.    . clotrimazole (LOTRIMIN) 1 % cream Apply 1 application topically 2 (two) times daily as needed (rash).    . clotrimazole (MYCELEX) 10 MG troche Take 10 mg by mouth daily as needed (Sore Mouth/thrush).     . colchicine 0.6 MG tablet Take 0.6 mg by mouth 2 (two) times daily as needed (for gout flares).    . cycloSPORINE (RESTASIS) 0.05 % ophthalmic emulsion Place 1 drop into both eyes 2 (two) times daily.    . diclofenac sodium (VOLTAREN) 1 % GEL Apply 2 g topically 4 (four) times daily. (Patient taking differently: Apply 2 g topically 4 (four) times daily as needed (pain.). ) 1 Tube 0  . empagliflozin (JARDIANCE) 10 MG TABS tablet Take 10 mg by mouth daily before breakfast. 90 tablet 3  . EPINEPHrine (EPIPEN 2-PAK) 0.3 mg/0.3 mL IJ SOAJ injection Inject 0.3 mg into the muscle once as needed (severe allergic reaction).    . febuxostat (ULORIC) 40 MG tablet Take 40 mg by mouth daily.  3  . fluconazole (DIFLUCAN) 100 MG tablet Take 100 mg by mouth daily.    [name] guaiFENesin (MUCINEX) 600 MG 12 hr tablet Take 600 mg by mouth at bedtime as needed for cough or to loosen phlegm.     . hydrocortisone 2.5 % cream Apply 1 application topically daily as needed (itching).   1  . hydroxypropyl methylcellulose / hypromellose (ISOPTO TEARS / GONIOVISC) 2.5 % ophthalmic solution Place 1 drop into both eyes 3 (three) times daily.     [name] ipratropium (ATROVENT) 0.02 % nebulizer solution Take 2.5 mLs (0.5 mg total) by nebulization 4 (four) times daily. (Patient taking differently: Take 0.5 mg by  nebulization every 6 (six) hours as needed for wheezing or shortness of breath. ) 75 mL 0  . isosorbide mononitrate (IMDUR) 30 MG 24 hr tablet Take 90 mg by mouth daily.    . isosorbide mononitrate (IMDUR) 60 MG 24 hr tablet TAKE 1 TABLET EVERY DAY 90 tablet 0  . loratadine (CLARITIN) 10 MG tablet Take 10 mg by mouth daily.    . metolazone (ZAROXOLYN) 2.5 MG tablet Take 2.5 mg by mouth 2 (two) times a week. Monday and Thursday    . nitroGLYCERIN (NITROSTAT) 0.4 MG SL tablet Place 1 tablet (0.4 mg total) under the tongue every 5 (five) minutes as needed for chest pain. 25 tablet 3  . ondansetron (ZOFRAN) 4 MG tablet Take 4 mg by mouth every 8 (eight) hours as needed.    . ondansetron (ZOFRAN-ODT) 4 MG disintegrating tablet Take 4 mg by mouth every 8 (eight) hours as needed for nausea or vomiting.    [name]  ONGLYZA 2.5 MG TABS tablet     . OXYGEN Inhale 3 L/min into the lungs continuous.     . pantoprazole (PROTONIX) 40 MG tablet Take 1 tablet (40 mg total) by mouth 2 (two) times daily. 180 tablet 3  . polyethylene glycol (MIRALAX / GLYCOLAX) 17 g packet Take 17 g by mouth in the morning.     . potassium chloride SA (KLOR-CON) 20 MEQ tablet Take 20 mEq by mouth 2 (two) times daily.    . predniSONE (DELTASONE) 10 MG tablet Take 10 mg by mouth daily as needed (as directed for gout flares).     . ranolazine (RANEXA) 1000 MG SR tablet Take 1 tablet (1,000 mg total) by mouth 2 (two) times daily. 60 tablet 2  . rosuvastatin (CRESTOR) 20 MG tablet Take 1 tablet (20 mg total) by mouth daily. 90 tablet 3  . spironolactone (ALDACTONE) 25 MG tablet Take 1 tablet (25 mg total) by mouth at bedtime. 90 tablet 3  . tiZANidine (ZANAFLEX) 4 MG tablet     . torsemide (DEMADEX) 20 MG tablet Take 3 tablets by mouth once daily 90 tablet 5  . traMADol (ULTRAM) 50 MG tablet Take 50 mg by mouth 3 (three) times daily as needed.    . triamcinolone cream (KENALOG) 0.1 % Apply 1 application topically 2 (two) times daily as needed  (itching).      No current facility-administered medications on file prior to visit.   Allergies  Allergen Reactions  . Ivp Dye [Iodinated Diagnostic Agents] Shortness Of Breath    No reaction to PO contrast with non-ionic dye.06-25-2014/rsm  . Shellfish Allergy Anaphylaxis  . Shellfish-Derived Products Anaphylaxis  . Sulfa Antibiotics Shortness Of Breath  . Iodine Hives  . Atorvastatin Other (See Comments)    Pt states "causes bilateral leg pain/cramps."  . Benadryl [Diphenhydramine] Other (See Comments)    "THIS DRIVES ME CRAZY AND MAKES ME FEEL LIKE I AM DYING"  . Doxycycline Other (See Comments)    Unknown reaction, per patient  . Cephalexin Itching and Rash  . Zithromax [Azithromycin] Rash    No results found for this or any previous visit (from the past 2160 hour(s)).  Objective: General: Patient is awake, alert, and oriented x 3 and in no acute distress.  Integument: Skin is warm, dry and supple bilateral. Nails are tender, long, thickened and  dystrophic with subungual debris, consistent with onychomycosis, 1-5 bilateral. Dry blood blister to right 4th toe. No signs of infection. No open lesions or preulcerative lesions present bilateral. Remaining integument unremarkable.  Vasculature:  Dorsalis Pedis pulse 1/4 bilateral. Posterior Tibial pulse 1 /4 bilateral.  Capillary fill time <3 sec 1-5 bilateral. Scant hair growth to the level of the digits. Temperature gradient within normal limits. No varicosities present bilateral. No edema present bilateral.   Neurology: The patient has intact sensation measured wih a 5.07/10g Semmes Weinstein Monofilament at all pedal sites bilateral . Vibratory sensation diminished bilateral with tuning fork. No Babinski sign present bilateral.   Musculoskeletal: Asymptomatic hammertoe pedal deformities noted bilateral. History of gout at left 1st toe. Muscular strength 5/5 in all lower extremity muscular groups bilateral without pain on range  of motion except left 1st toe . No tenderness with calf compression bilateral.  Assessment and Plan: Problem List Items Addressed This Visit    None    Visit Diagnoses    Pain due to onychomycosis of toenails of both feet    -  Primary   Relevant Medications  fluconazole (DIFLUCAN) 100 MG tablet   Diabetic polyneuropathy associated with type 2 diabetes mellitus (HCC)       Relevant Medications   ONGLYZA 2.5 MG TABS tablet   tiZANidine (ZANAFLEX) 4 MG tablet   Hammer toes of both feet       Blister of fourth toe          -Examined patient. -Discussed and educated patient on diabetic foot care, especially with  regards to the vascular, neurological and musculoskeletal systems.  -Stressed the importance of good glycemic control and the detriment of not  controlling glucose levels in relation to the foot. -Mechanically debrided all nails 1-5 bilateral using sterile nail nipper and filed with dremel without incident  -Gave sample of foot miracle cream -Recommend to closely monitor blister -Answered all patient questions -Patient to return  in 3 months for at risk foot care -Patient advised to call the office if any problems or questions arise in the meantime.  Landis Martins, DPM

## 2019-12-28 ENCOUNTER — Encounter: Payer: Self-pay | Admitting: Gastroenterology

## 2019-12-28 ENCOUNTER — Ambulatory Visit (INDEPENDENT_AMBULATORY_CARE_PROVIDER_SITE_OTHER): Payer: Medicare Other | Admitting: Gastroenterology

## 2019-12-28 ENCOUNTER — Telehealth: Payer: Self-pay | Admitting: *Deleted

## 2019-12-28 ENCOUNTER — Encounter: Payer: Self-pay | Admitting: Cardiovascular Disease

## 2019-12-28 VITALS — BP 134/76 | HR 79 | Ht 62.0 in | Wt 240.2 lb

## 2019-12-28 DIAGNOSIS — R140 Abdominal distension (gaseous): Secondary | ICD-10-CM | POA: Diagnosis not present

## 2019-12-28 DIAGNOSIS — K219 Gastro-esophageal reflux disease without esophagitis: Secondary | ICD-10-CM

## 2019-12-28 DIAGNOSIS — R110 Nausea: Secondary | ICD-10-CM | POA: Diagnosis not present

## 2019-12-28 DIAGNOSIS — R1310 Dysphagia, unspecified: Secondary | ICD-10-CM

## 2019-12-28 NOTE — Patient Instructions (Addendum)
You have been scheduled for a [facility] case on 01/25/2020 at 12:30pm, Separate instructions have been given  Take Benefiber 1 tablespoon 2-3 times daily with meals  You will be contacted by our office prior to your procedure for directions on holding your Plavix.  If you do not hear from our office 1 week prior to your scheduled procedure, please call [phone] to discuss.    Gastroesophageal Reflux Disease, Adult Gastroesophageal reflux (GER) happens when acid from the stomach flows up into the tube that connects the mouth and the stomach (esophagus). Normally, food travels down the esophagus and stays in the stomach to be digested. However, when a person has GER, food and stomach acid sometimes move back up into the esophagus. If this becomes a more serious problem, the person may be diagnosed with a disease called gastroesophageal reflux disease (GERD). GERD occurs when the reflux:  Happens often.  Causes frequent or severe symptoms.  Causes problems such as damage to the esophagus. When stomach acid comes in contact with the esophagus, the acid may cause soreness (inflammation) in the esophagus. Over time, GERD may create small holes (ulcers) in the lining of the esophagus. What are the causes? This condition is caused by a problem with the muscle between the esophagus and the stomach (lower esophageal sphincter, or LES). Normally, the LES muscle closes after food passes through the esophagus to the stomach. When the LES is weakened or abnormal, it does not close properly, and that allows food and stomach acid to go back up into the esophagus. The LES can be weakened by certain dietary substances, medicines, and medical conditions, including:  Tobacco use.  Pregnancy.  Having a hiatal hernia.  Alcohol use.  Certain foods and beverages, such as coffee, chocolate, onions, and peppermint. What increases the risk? You are more likely to develop this condition if  you:  Have an increased body weight.  Have a connective tissue disorder.  Use NSAID medicines. What are the signs or symptoms? Symptoms of this condition include:  Heartburn.  Difficult or painful swallowing.  The feeling of having a lump in the throat.  Abitter taste in the mouth.  Bad breath.  Having a large amount of saliva.  Having an upset or bloated stomach.  Belching.  Chest pain. Different conditions can cause chest pain. Make sure you see your health care provider if you experience chest pain.  Shortness of breath or wheezing.  Ongoing (chronic) cough or a night-time cough.  Wearing away of tooth enamel.  Weight loss. How is this diagnosed? Your health care provider will take a medical history and perform a physical exam. To determine if you have mild or severe GERD, your health care provider may also monitor how you respond to treatment. You may also have tests, including:  A test to examine your stomach and esophagus with a small camera (endoscopy).  A test thatmeasures the acidity level in your esophagus.  A test thatmeasures how much pressure is on your esophagus.  A barium swallow or modified barium swallow test to show the shape, size, and functioning of your esophagus. How is this treated? The goal of treatment is to help relieve your symptoms and to prevent complications. Treatment for this condition may vary depending on how severe your symptoms are. Your health care provider may recommend:  Changes to your diet.  Medicine.  Surgery. Follow these instructions at home: Eating and drinking   Follow a diet as recommended by your health care  provider. This may involve avoiding foods and drinks such as: ? Coffee and tea (with or without caffeine). ? Drinks that containalcohol. ? Energy drinks and sports drinks. ? Carbonated drinks or sodas. ? Chocolate and cocoa. ? Peppermint and mint flavorings. ? Garlic and  onions. ? Horseradish. ? Spicy and acidic foods, including peppers, chili powder, curry powder, vinegar, hot sauces, and barbecue sauce. ? Citrus fruit juices and citrus fruits, such as oranges, lemons, and limes. ? Tomato-based foods, such as red sauce, chili, salsa, and pizza with red sauce. ? Fried and fatty foods, such as donuts, french fries, potato chips, and high-fat dressings. ? High-fat meats, such as hot dogs and fatty cuts of red and white meats, such as rib eye steak, sausage, ham, and bacon. ? High-fat dairy items, such as whole milk, butter, and cream cheese.  Eat small, frequent meals instead of large meals.  Avoid drinking large amounts of liquid with your meals.  Avoid eating meals during the 2-3 hours before bedtime.  Avoid lying down right after you eat.  Do not exercise right after you eat. Lifestyle   Do not use any products that contain nicotine or tobacco, such as cigarettes, e-cigarettes, and chewing tobacco. If you need help quitting, ask your health care provider.  Try to reduce your stress by using methods such as yoga or meditation. If you need help reducing stress, ask your health care provider.  If you are overweight, reduce your weight to an amount that is healthy for you. Ask your health care provider for guidance about a safe weight loss goal. General instructions  Pay attention to any changes in your symptoms.  Take over-the-counter and prescription medicines only as told by your health care provider. Do not take aspirin, ibuprofen, or other NSAIDs unless your health care provider told you to do so.  Wear loose-fitting clothing. Do not wear anything tight around your waist that causes pressure on your abdomen.  Raise (elevate) the head of your bed about 6 inches (15 cm).  Avoid bending over if this makes your symptoms worse.  Keep all follow-up visits as told by your health care provider. This is important. Contact a health care provider  if:  You have: ? New symptoms. ? Unexplained weight loss. ? Difficulty swallowing or it hurts to swallow. ? Wheezing or a persistent cough. ? A hoarse voice.  Your symptoms do not improve with treatment. Get help right away if you:  Have pain in your arms, neck, jaw, teeth, or back.  Feel sweaty, dizzy, or light-headed.  Have chest pain or shortness of breath.  Vomit and your vomit looks like blood or coffee grounds.  Faint.  Have stool that is bloody or black.  Cannot swallow, drink, or eat. Summary  Gastroesophageal reflux happens when acid from the stomach flows up into the esophagus. GERD is a disease in which the reflux happens often, causes frequent or severe symptoms, or causes problems such as damage to the esophagus.  Treatment for this condition may vary depending on how severe your symptoms are. Your health care provider may recommend diet and lifestyle changes, medicine, or surgery.  Contact a health care provider if you have new or worsening symptoms.  Take over-the-counter and prescription medicines only as told by your health care provider. Do not take aspirin, ibuprofen, or other NSAIDs unless your health care provider told you to do so.  Keep all follow-up visits as told by your health care provider. This is important. This  information is not intended to replace advice given to you by your health care provider. Make sure you discuss any questions you have with your health care provider. Document Revised: 08/05/2017 Document Reviewed: 08/05/2017 Elsevier Patient Education  [employer].  Follow up in 6 months  Due to recent changes in healthcare laws, you may see the results of your imaging and laboratory studies on MyChart before your provider has had a chance to review them.  We understand that in some cases there may be results that are confusing or concerning to you. Not all laboratory results come back in the same time frame and the provider may be  waiting for multiple results in order to interpret others.  Please give [place] 48 hours in order for your provider to thoroughly review all the results before contacting the office for clarification of your results.   If you are age 29 or older, your body mass index should be between 23-30. Your Body mass index is 43.93 kg/m. If this is out of the aforementioned range listed, please consider follow up with your Primary Care Provider.  If you are age 43 or younger, your body mass index should be between 19-25. Your Body mass index is 43.93 kg/m. If this is out of the aformentioned range listed, please consider follow up with your Primary Care Provider.    I appreciate the  opportunity to care for you  Thank You   Harl Bowie , MD

## 2019-12-28 NOTE — Progress Notes (Signed)
[name]    [id number]    Nov 21, 1945  Primary Care Physician:[name], [name], MD  Referring Physician: Lujean Amel, MD [place],  [place] [address]   Chief complaint:  Dysphagia  HPI:  74 year old female with multiple comorbidities, morbid obesity, congestive heart failure on home oxygen.  Chronic antiplatelet therapy with Plavix.  She continues to have difficulty swallowing, she is choking with most meats, currently has difficulty even getting hamburger down.  She is doing okay with soft chicken which she is eating most of the time  Denies any regurgitation or vomiting.  She has epigastric and retrosternal discomfort, somewhat better with Protonix  She has intermittent constipation, takes MiraLAX 2 capfuls as needed with subsequent diarrhea.  She feels tired after bowel movement Denies any melena, rectal bleeding or vomiting  Barium esophagram November 04, 2019: Mild press by esophagus with good primary peristaltic wave.  Smooth narrowing of distal esophagus preventing passage of 13 mm tablet  EGD and colonoscopy in November 2019. EGD revealed grade B esophagitis, small hiatal hernia, gastritis, and a small nodule in the stomach. Gastric biopsies showed no H. pylori. Showed reactivegastropathy withulceration. Colonoscopy revealed polyps that were removed and were tubular adenomas. Also had diverticulosis and internal/external hemorrhoids.  EGD December 14, 2006 showed mild antral gastritis otherwise normal exam. H. pylori CLOtest negative.  Colonoscopy September 27, 2002 by Dr. Olevia Perches with removal of small diminutive hyperplastic polyps and left-sided diverticulosis   Outpatient Encounter Medications as of 12/28/2019  Medication Sig  . ACCU-CHEK AVIVA PLUS test strip   . Accu-Chek Softclix Lancets lancets   . acetaminophen (TYLENOL) 500 MG tablet Take 500-1,000 mg by mouth every 12 (twelve) hours as needed (for pain or  headaches).   [name] albuterol (PROVENTIL HFA;VENTOLIN HFA) 108 (90 Base) MCG/ACT inhaler Inhale 2 puffs into the lungs every 6 (six) hours as needed for wheezing or shortness of breath.  [name] albuterol (PROVENTIL) (2.5 MG/3ML) 0.083% nebulizer solution Take 3 mLs (2.5 mg total) by nebulization every 6 (six) hours as needed for wheezing or shortness of breath.  . Alcohol Swabs (B-D SINGLE USE SWABS REGULAR) PADS   . allopurinol (ZYLOPRIM) 100 MG tablet Take 1 tablet (100 mg total) by mouth daily.  [name] aspirin EC 81 MG tablet Take 1 tablet (81 mg total) by mouth daily.  . Biotin 1000 MCG tablet Take 1,000 mcg by mouth daily.  . bisoprolol (ZEBETA) 5 MG tablet Take 5 mg by mouth daily.  . budesonide-formoterol (SYMBICORT) 160-4.5 MCG/ACT inhaler Inhale 2 puffs into the lungs 2 (two) times daily.  . cetirizine (ZYRTEC) 10 MG tablet Take 10 mg by mouth daily.   . clopidogrel (PLAVIX) 75 MG tablet Take 75 mg by mouth daily.  . clotrimazole (LOTRIMIN) 1 % cream Apply 1 application topically 2 (two) times daily as needed (rash).  . clotrimazole (MYCELEX) 10 MG troche Take 10 mg by mouth daily as needed (Sore Mouth/thrush).   . colchicine 0.6 MG tablet Take 0.6 mg by mouth 2 (two) times daily as needed (for gout flares).  . cycloSPORINE (RESTASIS) 0.05 % ophthalmic emulsion Place 1 drop into both eyes 2 (two) times daily.  . diclofenac sodium (VOLTAREN) 1 % GEL Apply 2 g topically 4 (four) times daily. (Patient taking differently: Apply 2 g topically 4 (four) times daily as needed (pain.). )  . empagliflozin (JARDIANCE) 10 MG TABS tablet Take 10 mg by mouth daily before  breakfast.  . EPINEPHrine (EPIPEN 2-PAK) 0.3 mg/0.3 mL IJ SOAJ injection Inject 0.3 mg into the muscle once as needed (severe allergic reaction).  . febuxostat (ULORIC) 40 MG tablet Take 40 mg by mouth daily.  . fluconazole (DIFLUCAN) 100 MG tablet Take 100 mg by mouth daily.  [name] guaiFENesin (MUCINEX) 600 MG 12 hr tablet Take 600 mg by mouth at  bedtime as needed for cough or to loosen phlegm.   . hydrocortisone 2.5 % cream Apply 1 application topically daily as needed (itching).   . hydroxypropyl methylcellulose / hypromellose (ISOPTO TEARS / GONIOVISC) 2.5 % ophthalmic solution Place 1 drop into both eyes 3 (three) times daily.   [name] ipratropium (ATROVENT) 0.02 % nebulizer solution Take 2.5 mLs (0.5 mg total) by nebulization 4 (four) times daily. (Patient taking differently: Take 0.5 mg by nebulization every 6 (six) hours as needed for wheezing or shortness of breath. )  . isosorbide mononitrate (IMDUR) 30 MG 24 hr tablet Take 90 mg by mouth daily.  . isosorbide mononitrate (IMDUR) 60 MG 24 hr tablet TAKE 1 TABLET EVERY DAY  . loratadine (CLARITIN) 10 MG tablet Take 10 mg by mouth daily.  . metolazone (ZAROXOLYN) 2.5 MG tablet Take 2.5 mg by mouth 2 (two) times a week. Monday and Thursday  . nitroGLYCERIN (NITROSTAT) 0.4 MG SL tablet Place 1 tablet (0.4 mg total) under the tongue every 5 (five) minutes as needed for chest pain.  [name] ondansetron (ZOFRAN) 4 MG tablet Take 4 mg by mouth every 8 (eight) hours as needed.  . ondansetron (ZOFRAN-ODT) 4 MG disintegrating tablet Take 4 mg by mouth every 8 (eight) hours as needed for nausea or vomiting.  . ONGLYZA 2.5 MG TABS tablet   . OXYGEN Inhale 3 L/min into the lungs continuous.   . pantoprazole (PROTONIX) 40 MG tablet Take 1 tablet (40 mg total) by mouth 2 (two) times daily.  . polyethylene glycol (MIRALAX / GLYCOLAX) 17 g packet Take 17 g by mouth in the morning.   . potassium chloride SA (KLOR-CON) 20 MEQ tablet Take 20 mEq by mouth 2 (two) times daily.  . predniSONE (DELTASONE) 10 MG tablet Take 10 mg by mouth daily as needed (as directed for gout flares).   . ranolazine (RANEXA) 1000 MG SR tablet Take 1 tablet (1,000 mg total) by mouth 2 (two) times daily.  . rosuvastatin (CRESTOR) 20 MG tablet Take 1 tablet (20 mg total) by mouth daily.  [name] spironolactone (ALDACTONE) 25 MG tablet Take 1  tablet (25 mg total) by mouth at bedtime.  [name] tiZANidine (ZANAFLEX) 4 MG tablet   . torsemide (DEMADEX) 20 MG tablet Take 3 tablets by mouth once daily  . traMADol (ULTRAM) 50 MG tablet Take 50 mg by mouth 3 (three) times daily as needed.  . triamcinolone cream (KENALOG) 0.1 % Apply 1 application topically 2 (two) times daily as needed (itching).    No facility-administered encounter medications on file as of 12/28/2019.    Allergies as of 12/28/2019 - Review Complete 12/15/2019  Allergen Reaction Noted  . Ivp dye [iodinated diagnostic agents] Shortness Of Breath 10/13/2010  . Shellfish allergy Anaphylaxis 11/20/2017  . Shellfish-derived products Anaphylaxis 10/13/2010  . Sulfa antibiotics Shortness Of Breath   . Iodine Hives 10/13/2010  . Atorvastatin Other (See Comments) 11/12/2015  . Benadryl [diphenhydramine] Other (See Comments) 05/31/2019  . Doxycycline Other (See Comments) 04/18/2017  . Cephalexin Itching and Rash 04/30/2015  . Zithromax [azithromycin] Rash 06/25/2014    Past Medical History:  Diagnosis Date  . ABDOMINAL PAIN -GENERALIZED 02/21/2010   Qualifier: Diagnosis of  By: Chester Holstein NP, Nevin Bloodgood    . AICD (automatic cardioverter/defibrillator) present   . Anemia   . Arthritis   . Asthma   . Atrial fibrillation ([place]) 10/24/2015   Questionable history of A Fib/A Flutter. On device interrogation on 9/7, showed 0.14min of A Fib/A Flutter with 1% burden.  Device check in Jan 2018 showed no sustained AF (runs of less than 30 seconds). No anticoagulation indicated.  [name] CAD (coronary artery disease)    a. 10/09/16 LHC: SVG->LAD patent, SVG->Diag patent, SVG->RCA patent, SVG->LCx occluded. EF 60%, b. 10/31/16 LHC DES to AV groove Circ, DES to intermed branch  . Cellulitis and abscess of foot 12/2014   RT FOOT  . CHF (congestive heart failure) ([place])   . Chronic bronchitis ([place])   . Chronic lower back pain   . Complete heart block ([place]) 06/22/2015  . Diverticulosis   . Facial  numbness 02/2016  . Fatty liver disease, nonalcoholic   . Gastritis   . GERD (gastroesophageal reflux disease)   . Gout   . H/O hiatal hernia   . HEMORRHOIDS-EXTERNAL 02/21/2010   Qualifier: Diagnosis of  By: Chester Holstein NP, Nevin Bloodgood    . High cholesterol   . Hyperplastic colon polyp   . Hypertension   . IBS (irritable bowel syndrome)   . Internal hemorrhoids   . INTERNAL HEMORRHOIDS WITHOUT MENTION COMP 04/12/2007   Qualifier: Diagnosis of  By: Olevia Perches MD, Lowella Bandy   . Ischemic cardiomyopathy   . Nonischemic cardiomyopathy ([place]) 11/22/2011   Pt responded to BiV ICD- last EF 55-60% Sept 2017  . Obesity   . On home oxygen therapy    "2L at night" (10/31/2016)  . OSA (obstructive sleep apnea)    "can't tolerate a mask" (10/30/2016)  . PERSONAL HX COLONIC POLYPS 02/21/2010   Qualifier: Diagnosis of  By: Chester Holstein NP, Nevin Bloodgood    . Pneumonia    "couple times" (10/31/2016)  . Right facial numbness 03/05/2016  . Shortness of breath   . TIA (transient ischemic attack)    "recently" (10/31/2016)  . Type II diabetes mellitus ([place])     Past Surgical History:  Procedure Laterality Date  . ABDOMINAL ULTRASOUND  12/01/2011   Peripelvic cysts- #1- 2.4x1.9x2.3cm, #2-1.2x0.9x1.2cm  . ANKLE FRACTURE SURGERY Right    "had rod put in"  . ANTERIOR CERVICAL DECOMP/DISCECTOMY FUSION    . APPENDECTOMY    . BACK SURGERY    . BIOPSY  12/29/2017   Procedure: BIOPSY;  Surgeon: [name], MD;  [facility];  Service: Endoscopy;;  . BIV ICD INSERTION CRT-D  2001?  [name] BIV PACEMAKER GENERATOR CHANGE OUT N/A 11/02/2012   Procedure: BIV PACEMAKER GENERATOR CHANGE OUT;  Surgeon: Sanda Klein, MD;  Location: [facility];  Service: Cardiovascular;  Laterality: N/A;  . CARDIAC CATHETERIZATION  05/17/1999   No significant coronary obstructive disease w/ mild 20% luminal irregularity of the first diag branch of the LAD  . CARDIAC CATHETERIZATION  07/08/2002   No significant CAD, moderately depressed LV  systolic function  . CARDIAC CATHETERIZATION Bilateral 04/26/2007   Normal findings, recommend medical therapy  . CARDIAC CATHETERIZATION  02/18/2008   Moderate CAD, would benefit from having a functional study, recommend continue medical therapy  . CARDIAC CATHETERIZATION  07/23/2012   Medical therapy  . CARDIAC CATHETERIZATION N/A 11/24/2014   Procedure: Left Heart Cath and Coronary Angiography;  Surgeon: Troy Sine, MD;  Location: [facility] CV [facility];  Service: Cardiovascular;  Laterality: N/A;  . CARDIAC CATHETERIZATION  11/27/2014   Procedure: Intravascular Pressure Wire/FFR Study;  Surgeon: Peter M Martinique, MD;  Location: [facility];  Service: Cardiovascular;;  . CARDIAC CATHETERIZATION  10/09/2016  . CHOLECYSTECTOMY N/A 04/09/2018   Procedure: LAPAROSCOPIC CHOLECYSTECTOMY;  Surgeon: Greer Pickerel, MD;  [facility];  Service: General;  Laterality: N/A;  . COLONOSCOPY WITH PROPOFOL N/A 12/29/2017   Procedure: COLONOSCOPY WITH PROPOFOL;  Surgeon: [name], MD;  [facility];  Service: Endoscopy;  Laterality: N/A;  . CORONARY ANGIOGRAM  2010  . CORONARY ARTERY BYPASS GRAFT N/A 11/29/2014   Procedure: CORONARY ARTERY BYPASS GRAFTING x 5 (LIMA-LAD, SVG-D, SVG-OM1-OM2, SVG-PD);  Surgeon: Melrose Nakayama, MD;  Location: [facility];  Service: Open Heart Surgery;  Laterality: N/A;  . CORONARY STENT INTERVENTION N/A 10/31/2016   Procedure: CORONARY STENT INTERVENTION;  Surgeon: Burnell Blanks, MD;  Location: [facility] CV LAB;  Service: Cardiovascular;  Laterality: N/A;  . ESOPHAGOGASTRODUODENOSCOPY (EGD) WITH PROPOFOL N/A 12/29/2017   Procedure: ESOPHAGOGASTRODUODENOSCOPY (EGD) WITH PROPOFOL;  Surgeon: [name], MD;  [facility];  Service: Endoscopy;  Laterality: N/A;  . FRACTURE SURGERY    . INSERT / REPLACE / [place]  . KNEE ARTHROSCOPY Bilateral   . LEFT HEART CATH AND CORS/GRAFTS ANGIOGRAPHY N/A 12/09/2017    Procedure: LEFT HEART CATH AND CORS/GRAFTS ANGIOGRAPHY;  Surgeon: Troy Sine, MD;  Location: [facility] LAB;  Service: Cardiovascular;  Laterality: N/A;  . LEFT HEART CATHETERIZATION WITH CORONARY ANGIOGRAM N/A 07/23/2012   Procedure: LEFT HEART CATHETERIZATION WITH CORONARY ANGIOGRAM;  Surgeon: Leonie Man, MD;  Location: [facility] CATH LAB;  Service: Cardiovascular;  Laterality: N/A;  Carlton Adam MYOVIEW  11/14/2011   Mild-moderate defect seen in Mid Inferolateral and Mid Anterolateral regions-consistant w/ infarct/scar. No significant ischemia demonstrated.  [name] POLYPECTOMY  12/29/2017   Procedure: POLYPECTOMY;  Surgeon: [name], MD;  [facility];  Service: Endoscopy;;  . RIGHT/LEFT HEART CATH AND CORONARY ANGIOGRAPHY N/A 10/09/2016   Procedure: RIGHT/LEFT HEART CATH AND CORONARY ANGIOGRAPHY;  Surgeon: Jolaine Artist, MD;  Location: [facility] LAB;  Service: Cardiovascular;  Laterality: N/A;  . RIGHT/LEFT HEART CATH AND CORONARY/GRAFT ANGIOGRAPHY N/A 03/11/2019   Procedure: RIGHT/LEFT HEART CATH AND CORONARY/GRAFT ANGIOGRAPHY;  Surgeon: Jolaine Artist, MD;  Location: [facility] LAB;  Service: Cardiovascular;  Laterality: N/A;  . TEE WITHOUT CARDIOVERSION N/A 11/29/2014   Procedure: TRANSESOPHAGEAL ECHOCARDIOGRAM (TEE);  Surgeon: Melrose Nakayama, MD;  Location: [facility];  Service: Open Heart Surgery;  Laterality: N/A;  . TRANSTHORACIC ECHOCARDIOGRAM  07/23/2012   EF 55-60%, normal-mild  . TUBAL LIGATION      Family History  Problem Relation Age of Onset  . Breast cancer Mother   . Diabetes Mother   . Heart disease Maternal Grandmother   . Kidney disease Maternal Grandmother   . Diabetes Maternal Grandmother   . Glaucoma Maternal Aunt   . Heart disease Maternal Aunt   . Asthma Sister   . Colon cancer Neg Hx   . Stomach cancer Neg Hx   . Pancreatic cancer Neg Hx     Social History   Socioeconomic History  . Marital status: Widowed     Spouse name: Not on file  . Number of children: 5  . Years of education: Not on file  . Highest education level: Not on file  Occupational History  .  Occupation: STAFF/BUFFET    Employer: [employer]  Tobacco Use  . Smoking status: Former Smoker    Packs/day: 0.25    Years: 3.00    Pack years: 0.75    Types: Cigarettes    Quit date: 02/11/1968    Years since quitting: 51.9  . Smokeless tobacco: Never Used  Vaping Use  . Vaping Use: Never used  Substance and Sexual Activity  . Alcohol use: No  . Drug use: No  . Sexual activity: Never  Other Topics Concern  . Not on file  Social History Narrative   Widowed last year. She lives with her two sons.   Social Determinants of Health   Financial Resource Strain:   . Difficulty of Paying Living Expenses: Not on file  Food Insecurity:   . Worried About Charity fundraiser in the Last Year: Not on file  . Ran Out of Food in the Last Year: Not on file  Transportation Needs:   . Lack of Transportation (Medical): Not on file  . Lack of Transportation (Non-Medical): Not on file  Physical Activity:   . Days of Exercise per Week: Not on file  . Minutes of Exercise per Session: Not on file  Stress:   . Feeling of Stress : Not on file  Social Connections:   . Frequency of Communication with Friends and Family: Not on file  . Frequency of Social Gatherings with Friends and Family: Not on file  . Attends Religious Services: Not on file  . Active Member of Clubs or Organizations: Not on file  . Attends Archivist Meetings: Not on file  . Marital Status: Not on file  Intimate Partner Violence:   . Fear of Current or Ex-Partner: Not on file  . Emotionally Abused: Not on file  . Physically Abused: Not on file  . Sexually Abused: Not on file      Review of systems: All other review of systems negative except as mentioned in the HPI.   Physical Exam: Vitals:   12/28/19 0821  BP: 134/76  Pulse: 79  SpO2: 97%     Body mass index is 43.93 kg/m. Gen:      No acute distress HEENT:  sclera anicteric Abd:      soft, non-tender; no palpable masses, no distension Ext:    No edema Neuro: alert and oriented x 3 Psych: normal mood and affect  Data Reviewed:  Reviewed labs, radiology imaging, old records and pertinent past GI work up   Assessment and Plan/Recommendations:  74 year old very pleasant female with multiple comorbidities, CAD on Plavix, CHF on continuous home O2  Barium esophagram with distal esophageal stricture, 13 mm barium tablet did not transverse through it.  We will plan to proceed with EGD with esophageal dilation, to be scheduled at [facility] endoscopy unit due to oxygen home use We will request clearance from cardiology to hold Plavix for 5 days prior to the procedure  If EGD unremarkable and continues to have persistent symptoms, will plan to do esophageal manometry to exclude E GJ functional outflow obstruction or achalasia  GERD: Continue Protonix 40 mg twice daily and antireflux measures  Chronic idiopathic constipation: Continue MiraLAX daily with goal of 1-2 soft bowel movements daily Start taking Benefiber 1 tablespoon 2-3 times daily with meals  Continue with dietary modifications and exercise for weight loss    Return in 6 months or sooner if needed  This visit required >40 minutes of patient care (this  includes precharting, chart review, review of results, face-to-face time used for counseling as well as treatment plan and follow-up. The patient was provided an opportunity to ask questions and all were answered. The patient agreed with the plan and demonstrated an understanding of the instructions.  Damaris Hippo , MD    CC: Lujean Amel, MD

## 2019-12-28 NOTE — Telephone Encounter (Signed)
Per Letter "[name] is at low risk, from a cardiac standpoint, for the upcoming endoscopy procedure.  Clopidogrel can be held 5 days prior to the procedure".

## 2019-12-28 NOTE — Telephone Encounter (Signed)
Called patient to inform her to Hold Plavix 5 days before hospital procedure

## 2019-12-28 NOTE — Telephone Encounter (Signed)
   Primary Cardiologist: Sanda Klein, MD  Chart reviewed as part of pre-operative protocol coverage.  Hx of hx of CAD s/p CABG (11/2014: LIMA-LAD, SVG-D1, SVG-OM1-OM2, SVG-PDA), chronic systolic and diastolic heart failure, NICM and CHB s/p ICD. On lifelong dual antiplatelet therapy.   Dr. [name], can pt hold Plavix for 5 days? Please forward your response to [facility] PREOP.   Thank you  Leanor Kail, PA 12/28/2019, 9:33 AM

## 2019-12-28 NOTE — Telephone Encounter (Signed)
Letter addressed to [facility] and sent via Epic.

## 2019-12-28 NOTE — Telephone Encounter (Signed)
[facility] Pre-operative Risk Assessment     Request for surgical clearance:     Endoscopy Procedure  What type of surgery is being performed?     Upper Endoscopy  When is this surgery scheduled?     01/25/2020 at [facility]  What type of clearance is required ?   Pharmacy  Are there any medications that need to be held prior to surgery and how long? Plavix   5 days?  Practice name and name of physician performing surgery?      [facility]  What is your office phone and fax number?      Phone- [phone]  [phone]  Anesthesia type (None, local, MAC, general) ?       MAC

## 2020-01-12 ENCOUNTER — Telehealth: Payer: Self-pay

## 2020-01-12 NOTE — Telephone Encounter (Signed)
Calling patient to arrange device clinic apt.   Patient scheduled in device clinic 01/17/20 @ 2:30PM. Patient aware of date, time and location.

## 2020-01-12 NOTE — Telephone Encounter (Signed)
Patient called in wanting to come into the device clinic to be seen she is worried about her pacemaker and she cant be seen from [facility] until 03/2020. Patient states she no longer has a monitor [facility] told her to unplug it. Patient wants to be seen earlier than that can you please call patient

## 2020-01-12 NOTE — Telephone Encounter (Signed)
Spoke with the patient. She was offered an appointment today at 1:40 with Dr. [name] to do a download but was unable to come. She has been advised that Dr. [name] did not have any other openings as of right now.   Message sent to the device clinic to see if they had any openings. The patient is unable to download at home due to a recall.

## 2020-01-12 NOTE — Telephone Encounter (Signed)
We have 1 opening on 01/19/20 at 10:00 AM.

## 2020-01-14 ENCOUNTER — Other Ambulatory Visit: Payer: Self-pay | Admitting: Cardiovascular Disease

## 2020-01-17 ENCOUNTER — Ambulatory Visit (INDEPENDENT_AMBULATORY_CARE_PROVIDER_SITE_OTHER): Payer: Medicare Other | Admitting: Emergency Medicine

## 2020-01-17 ENCOUNTER — Other Ambulatory Visit: Payer: Self-pay

## 2020-01-17 DIAGNOSIS — I5042 Chronic combined systolic (congestive) and diastolic (congestive) heart failure: Secondary | ICD-10-CM

## 2020-01-17 DIAGNOSIS — I442 Atrioventricular block, complete: Secondary | ICD-10-CM

## 2020-01-17 LAB — CUP PACEART INCLINIC DEVICE CHECK
Brady Statistic RA Percent Paced: 0 %
Brady Statistic RV Percent Paced: 100 %
Date Time Interrogation Session: 20211207174045
Implantable Lead Implant Date: 20080923
Implantable Lead Implant Date: 20080923
Implantable Lead Implant Date: 20080923
Implantable Lead Location: 753858
Implantable Lead Location: 753859
Implantable Lead Location: 753860
Implantable Lead Model: 4285
Implantable Lead Model: 4555
Implantable Lead Model: 7120
Implantable Lead Serial Number: 170220
Implantable Lead Serial Number: 486468
Implantable Pulse Generator Implant Date: 20140923
Lead Channel Impedance Value: 1251 Ohm
Lead Channel Impedance Value: 695 Ohm
Lead Channel Impedance Value: 810 Ohm
Lead Channel Pacing Threshold Amplitude: 0.9 V
Lead Channel Pacing Threshold Amplitude: 1.8 V
Lead Channel Pacing Threshold Amplitude: 4 V
Lead Channel Pacing Threshold Pulse Width: 0.4 ms
Lead Channel Pacing Threshold Pulse Width: 2 ms
Lead Channel Pacing Threshold Pulse Width: 2 ms
Lead Channel Sensing Intrinsic Amplitude: 1.1 mV
Lead Channel Setting Pacing Amplitude: 2 V
Lead Channel Setting Pacing Amplitude: 3 V
Lead Channel Setting Pacing Pulse Width: 0.4 ms
Lead Channel Setting Pacing Pulse Width: 2 ms
Lead Channel Setting Sensing Sensitivity: 2.5 mV
Lead Channel Setting Sensing Sensitivity: 2.5 mV
Pulse Gen Serial Number: 100731

## 2020-01-17 NOTE — Progress Notes (Signed)
CRT-P device check in clinic. Normal device function. Thresholds, sensing, impedance consistent with previous measurements. Histograms appropriate for patient and level of activity. 10 AT events, longest 14 seconds in duration, + OAC. 5 PMT events, not true PMT. 1 NSVT event, <20 beats.  Patient bi-ventricularly pacing 100% of the time. Device programmed with appropriate safety margins. Device heart failure diagnostics are within normal limits and stable over time. Estimated longevity 2 years. Patient does not complete remotes per [facility]. Patient will follow up with Dr. [name].

## 2020-01-18 ENCOUNTER — Encounter (HOSPITAL_COMMUNITY): Payer: Self-pay | Admitting: Gastroenterology

## 2020-01-18 NOTE — Progress Notes (Signed)
Thanks. Has a device under recall for  potential serious reset with problems during remote download problems Device dependent.

## 2020-01-19 ENCOUNTER — Telehealth: Payer: Self-pay | Admitting: Gastroenterology

## 2020-01-19 NOTE — Telephone Encounter (Signed)
Patient called requesting to cancel her procedure scheduled on 01/25/20 at [facility] and would like to reschedule after Christmas.

## 2020-01-19 NOTE — Telephone Encounter (Signed)
Spoke with the patient. She has church functions and she has grandchildren coming into town to stay with her. She cannot fulfill the quarantine requirements. She asks to be rescheduled. Promises to be very careful and eating slowly. I will look for a new opening on the hospital schedule in January or February. She is aware of the timeframe.

## 2020-01-19 NOTE — Progress Notes (Signed)
He is aware. [employer] initiated the advisory. We have several patients in the practice with certain models of BSC devices on recall, that are pacer dependent and who should not do home downloads. He can give you the list.

## 2020-01-20 NOTE — Telephone Encounter (Signed)
ok 

## 2020-01-21 ENCOUNTER — Inpatient Hospital Stay (HOSPITAL_COMMUNITY): Admission: RE | Admit: 2020-01-21 | Payer: Medicare Other | Source: Ambulatory Visit

## 2020-01-25 ENCOUNTER — Ambulatory Visit (HOSPITAL_COMMUNITY): Admission: RE | Admit: 2020-01-25 | Payer: Medicare Other | Source: Ambulatory Visit | Admitting: Gastroenterology

## 2020-01-25 SURGERY — ESOPHAGOGASTRODUODENOSCOPY (EGD) WITH PROPOFOL
Anesthesia: Monitor Anesthesia Care

## 2020-02-14 DIAGNOSIS — I10 Essential (primary) hypertension: Secondary | ICD-10-CM | POA: Diagnosis not present

## 2020-02-14 DIAGNOSIS — N183 Chronic kidney disease, stage 3 unspecified: Secondary | ICD-10-CM | POA: Diagnosis not present

## 2020-02-14 DIAGNOSIS — K219 Gastro-esophageal reflux disease without esophagitis: Secondary | ICD-10-CM | POA: Diagnosis not present

## 2020-02-14 DIAGNOSIS — J4520 Mild intermittent asthma, uncomplicated: Secondary | ICD-10-CM | POA: Diagnosis not present

## 2020-02-14 DIAGNOSIS — E1169 Type 2 diabetes mellitus with other specified complication: Secondary | ICD-10-CM | POA: Diagnosis not present

## 2020-02-14 DIAGNOSIS — I5032 Chronic diastolic (congestive) heart failure: Secondary | ICD-10-CM | POA: Diagnosis not present

## 2020-02-14 DIAGNOSIS — G8929 Other chronic pain: Secondary | ICD-10-CM | POA: Diagnosis not present

## 2020-02-14 DIAGNOSIS — I5033 Acute on chronic diastolic (congestive) heart failure: Secondary | ICD-10-CM | POA: Diagnosis not present

## 2020-02-14 DIAGNOSIS — E1122 Type 2 diabetes mellitus with diabetic chronic kidney disease: Secondary | ICD-10-CM | POA: Diagnosis not present

## 2020-02-14 DIAGNOSIS — E7800 Pure hypercholesterolemia, unspecified: Secondary | ICD-10-CM | POA: Diagnosis not present

## 2020-02-14 DIAGNOSIS — N184 Chronic kidney disease, stage 4 (severe): Secondary | ICD-10-CM | POA: Diagnosis not present

## 2020-02-14 DIAGNOSIS — I2510 Atherosclerotic heart disease of native coronary artery without angina pectoris: Secondary | ICD-10-CM | POA: Diagnosis not present

## 2020-03-02 DIAGNOSIS — R2689 Other abnormalities of gait and mobility: Secondary | ICD-10-CM | POA: Diagnosis not present

## 2020-03-06 ENCOUNTER — Telehealth: Payer: Self-pay | Admitting: Cardiovascular Disease

## 2020-03-06 MED ORDER — BISOPROLOL FUMARATE 5 MG PO TABS
5.0000 mg | ORAL_TABLET | Freq: Every day | ORAL | 1 refills | Status: DC
Start: 2020-03-06 — End: 2020-09-19

## 2020-03-06 MED ORDER — CLOPIDOGREL BISULFATE 75 MG PO TABS
75.0000 mg | ORAL_TABLET | Freq: Every day | ORAL | 1 refills | Status: DC
Start: 2020-03-06 — End: 2021-02-25

## 2020-03-06 MED ORDER — ISOSORBIDE MONONITRATE ER 30 MG PO TB24
90.0000 mg | ORAL_TABLET | Freq: Every day | ORAL | 1 refills | Status: DC
Start: 2020-03-06 — End: 2020-05-03

## 2020-03-06 NOTE — Telephone Encounter (Signed)
Rx has been sent to the pharmacy electronically. ° °

## 2020-03-06 NOTE — Telephone Encounter (Signed)
°*  STAT* If patient is at the pharmacy, call can be transferred to refill team.   1. Which medications need to be refilled? (please list name of each medication and dose if known)   isosorbide mononitrate (IMDUR) 30 MG 24 hr tablet  bisoprolol (ZEBETA) 5 MG tablet clopidogrel (PLAVIX) 75 MG tablet   2. Which pharmacy/location (including street and city if local pharmacy) is medication to be sent to? [facility], [address]  3. Do they need a 30 day or 90 day supply? 90 day supply

## 2020-03-08 DIAGNOSIS — J9612 Chronic respiratory failure with hypercapnia: Secondary | ICD-10-CM | POA: Diagnosis not present

## 2020-03-08 DIAGNOSIS — J9611 Chronic respiratory failure with hypoxia: Secondary | ICD-10-CM | POA: Diagnosis not present

## 2020-03-08 DIAGNOSIS — I5022 Chronic systolic (congestive) heart failure: Secondary | ICD-10-CM | POA: Diagnosis not present

## 2020-03-09 IMAGING — DX DG CHEST 2V
2 series · 2 of 2 positions shown · non-contrast
Comparison: October 10, 2018

CLINICAL DATA: Chest pain

EXAM:
CHEST - 2 VIEW

[chest lat]
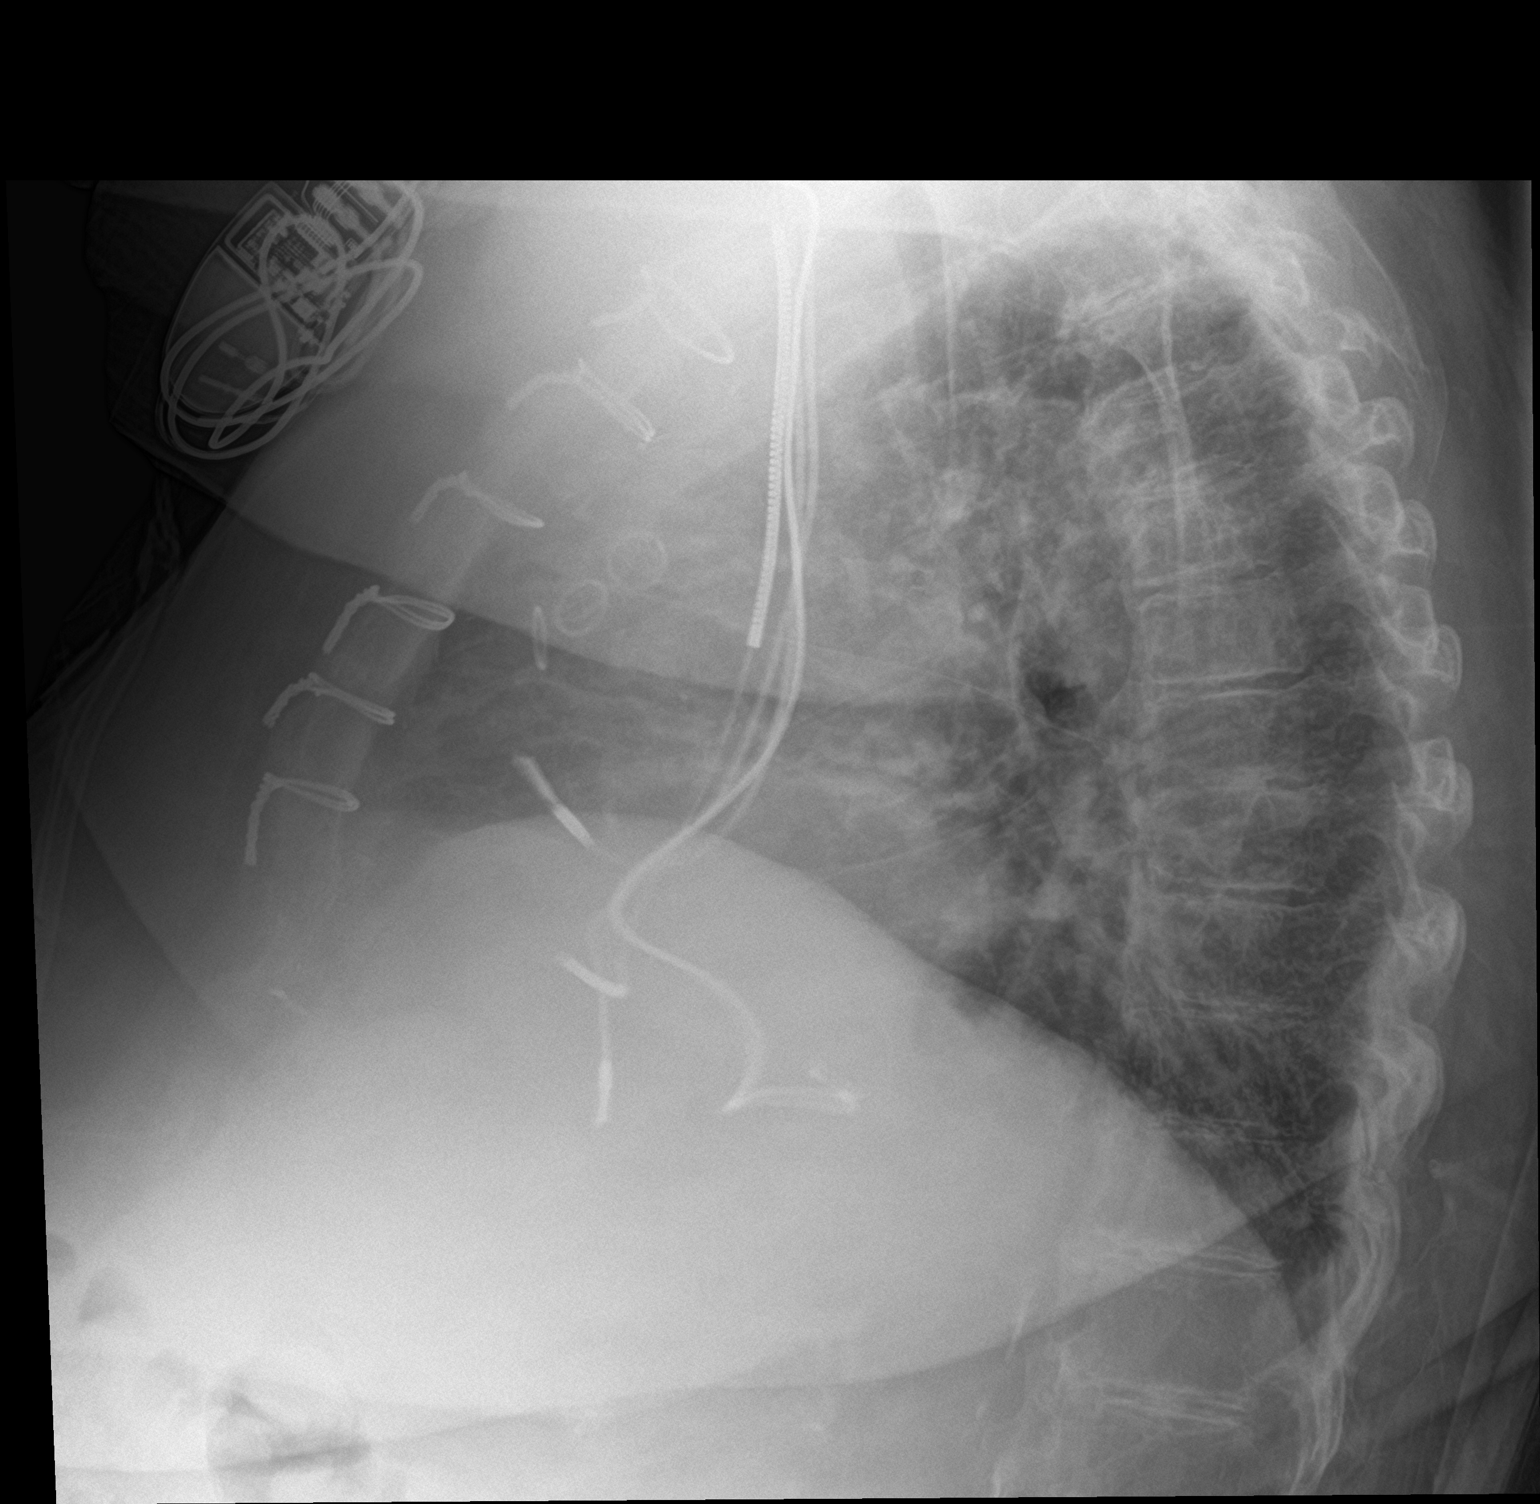

[chest ap]
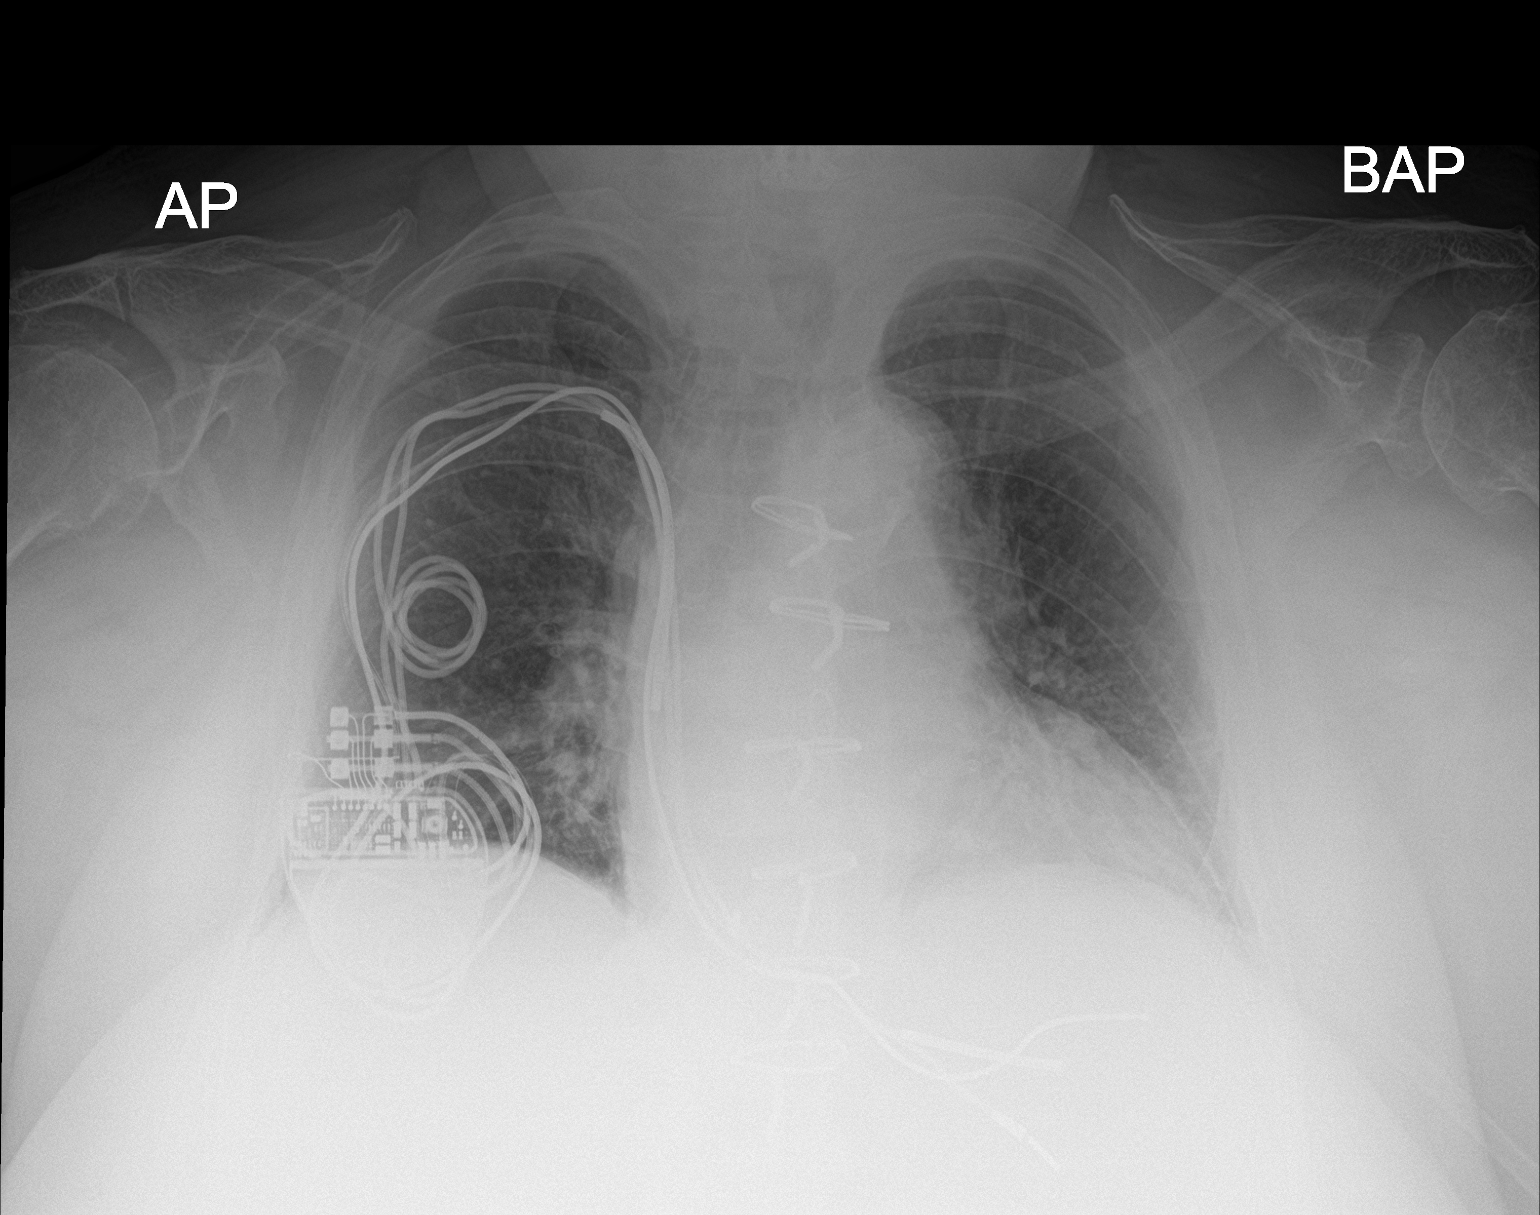

[2 of 2 positions shown; findings below may reference images not displayed]

FINDINGS: The heart size and mediastinal contours are stable. Cardiac
pacemaker is unchanged. The heart size is enlarged. Mild increased
pulmonary interstitium is identified bilaterally. There is no focal
pneumonia or pleural effusion. The visualized skeletal structures
are unremarkable.
IMPRESSION: Mild congestive heart failure.

## 2020-03-13 NOTE — Progress Notes (Deleted)
Cardiology Clinic Note   Patient Name: [name] Date of Encounter: 03/13/2020  Primary Care Provider:  Lujean Amel, MD Primary Cardiologist:  Sanda Klein, MD  Patient Profile    [name] 75 year old female presents the clinic today for follow-up evaluation of her essential hypertension, unstable angina, and chronic diastolic CHF.  Past Medical History    Past Medical History:  Diagnosis Date  . ABDOMINAL PAIN -GENERALIZED 02/21/2010   Qualifier: Diagnosis of  By: Chester Holstein NP, Nevin Bloodgood    . AICD (automatic cardioverter/defibrillator) present   . Anemia   . Arthritis   . Asthma   . Atrial fibrillation ([place]) 10/24/2015   Questionable history of A Fib/A Flutter. On device interrogation on 9/7, showed 0.76min of A Fib/A Flutter with 1% burden.  Device check in Jan 2018 showed no sustained AF (runs of less than 30 seconds). No anticoagulation indicated.  [name] CAD (coronary artery disease)    a. 10/09/16 LHC: SVG->LAD patent, SVG->Diag patent, SVG->RCA patent, SVG->LCx occluded. EF 60%, b. 10/31/16 LHC DES to AV groove Circ, DES to intermed branch  . Cellulitis and abscess of foot 12/2014   RT FOOT  . CHF (congestive heart failure) ([place])   . Chronic bronchitis ([employer])   . Chronic lower back pain   . Complete heart block ([place]) 06/22/2015  . Diverticulosis   . Facial numbness 02/2016  . Fatty liver disease, nonalcoholic   . Gastritis   . GERD (gastroesophageal reflux disease)   . Gout   . H/O hiatal hernia   . HEMORRHOIDS-EXTERNAL 02/21/2010   Qualifier: Diagnosis of  By: Chester Holstein NP, Nevin Bloodgood    . High cholesterol   . Hyperplastic colon polyp   . Hypertension   . IBS (irritable bowel syndrome)   . Internal hemorrhoids   . INTERNAL HEMORRHOIDS WITHOUT MENTION COMP 04/12/2007   Qualifier: Diagnosis of  By: Olevia Perches MD, Lowella Bandy   . Ischemic cardiomyopathy   . Nonischemic cardiomyopathy ([place]) 11/22/2011   Pt responded to BiV ICD- last EF 55-60% Sept 2017  . Obesity   . On  home oxygen therapy    "2L at night" (10/31/2016)  . OSA (obstructive sleep apnea)    "can't tolerate a mask" (10/30/2016)  . PERSONAL HX COLONIC POLYPS 02/21/2010   Qualifier: Diagnosis of  By: Chester Holstein NP, Nevin Bloodgood    . Pneumonia    "couple times" (10/31/2016)  . Right facial numbness 03/05/2016  . Shortness of breath   . TIA (transient ischemic attack)    "recently" (10/31/2016)  . Type II diabetes mellitus ([place])    Past Surgical History:  Procedure Laterality Date  . ABDOMINAL ULTRASOUND  12/01/2011   Peripelvic cysts- #1- 2.4x1.9x2.3cm, #2-1.2x0.9x1.2cm  . ANKLE FRACTURE SURGERY Right    "had rod put in"  . ANTERIOR CERVICAL DECOMP/DISCECTOMY FUSION    . APPENDECTOMY    . BACK SURGERY    . BIOPSY  12/29/2017   Procedure: BIOPSY;  Surgeon: [name], MD;  [facility]: [facility];  Service: Endoscopy;;  . BIV ICD INSERTION CRT-D  2001?  [name] BIV PACEMAKER GENERATOR CHANGE OUT N/A 11/02/2012   Procedure: BIV PACEMAKER GENERATOR CHANGE OUT;  Surgeon: Sanda Klein, MD;  Location: [facility];  Service: Cardiovascular;  Laterality: N/A;  . CARDIAC CATHETERIZATION  05/17/1999   No significant coronary obstructive disease w/ mild 20% luminal irregularity of the first diag branch of the LAD  . CARDIAC CATHETERIZATION  07/08/2002   No significant CAD, moderately depressed LV systolic  function  . CARDIAC CATHETERIZATION Bilateral 04/26/2007   Normal findings, recommend medical therapy  . CARDIAC CATHETERIZATION  02/18/2008   Moderate CAD, would benefit from having a functional study, recommend continue medical therapy  . CARDIAC CATHETERIZATION  07/23/2012   Medical therapy  . CARDIAC CATHETERIZATION N/A 11/24/2014   Procedure: Left Heart Cath and Coronary Angiography;  Surgeon: Troy Sine, MD;  Location: [facility];  Service: Cardiovascular;  Laterality: N/A;  . CARDIAC CATHETERIZATION  11/27/2014   Procedure: Intravascular Pressure Wire/FFR Study;  Surgeon: Peter M Martinique, MD;   Location: [facility];  Service: Cardiovascular;;  . CARDIAC CATHETERIZATION  10/09/2016  . CHOLECYSTECTOMY N/A 04/09/2018   Procedure: LAPAROSCOPIC CHOLECYSTECTOMY;  Surgeon: Greer Pickerel, MD;  [facility];  Service: General;  Laterality: N/A;  . COLONOSCOPY WITH PROPOFOL N/A 12/29/2017   Procedure: COLONOSCOPY WITH PROPOFOL;  Surgeon: [name], MD;  [facility];  Service: Endoscopy;  Laterality: N/A;  . CORONARY ANGIOGRAM  2010  . CORONARY ARTERY BYPASS GRAFT N/A 11/29/2014   Procedure: CORONARY ARTERY BYPASS GRAFTING x 5 (LIMA-LAD, SVG-D, SVG-OM1-OM2, SVG-PD);  Surgeon: Melrose Nakayama, MD;  Location: [facility];  Service: Open Heart Surgery;  Laterality: N/A;  . CORONARY STENT INTERVENTION N/A 10/31/2016   Procedure: CORONARY STENT INTERVENTION;  Surgeon: Burnell Blanks, MD;  Location: [facility];  Service: Cardiovascular;  Laterality: N/A;  . ESOPHAGOGASTRODUODENOSCOPY (EGD) WITH PROPOFOL N/A 12/29/2017   Procedure: ESOPHAGOGASTRODUODENOSCOPY (EGD) WITH PROPOFOL;  Surgeon: [name], MD;  [facility];  Service: Endoscopy;  Laterality: N/A;  . FRACTURE SURGERY    . INSERT / REPLACE / [place]  . KNEE ARTHROSCOPY Bilateral   . LEFT HEART CATH AND CORS/GRAFTS ANGIOGRAPHY N/A 12/09/2017   Procedure: LEFT HEART CATH AND CORS/GRAFTS ANGIOGRAPHY;  Surgeon: Troy Sine, MD;  Location: [facility];  Service: Cardiovascular;  Laterality: N/A;  . LEFT HEART CATHETERIZATION WITH CORONARY ANGIOGRAM N/A 07/23/2012   Procedure: LEFT HEART CATHETERIZATION WITH CORONARY ANGIOGRAM;  Surgeon: Leonie Man, MD;  Location: [facility]ATH LAB;  Service: Cardiovascular;  Laterality: N/A;  Carlton Adam MYOVIEW  11/14/2011   Mild-moderate defect seen in Mid Inferolateral and Mid Anterolateral regions-consistant w/ infarct/scar. No significant ischemia demonstrated.  [name] POLYPECTOMY  12/29/2017   Procedure: POLYPECTOMY;  Surgeon:  [name], MD;  [facility];  Service: Endoscopy;;  . RIGHT/LEFT HEART CATH AND CORONARY ANGIOGRAPHY N/A 10/09/2016   Procedure: RIGHT/LEFT HEART CATH AND CORONARY ANGIOGRAPHY;  Surgeon: Jolaine Artist, MD;  Location: [place] [facility];  Service: Cardiovascular;  Laterality: N/A;  . RIGHT/LEFT HEART CATH AND CORONARY/GRAFT ANGIOGRAPHY N/A 03/11/2019   Procedure: RIGHT/LEFT HEART CATH AND CORONARY/GRAFT ANGIOGRAPHY;  Surgeon: Jolaine Artist, MD;  Location: [facility];  Service: Cardiovascular;  Laterality: N/A;  . TEE WITHOUT CARDIOVERSION N/A 11/29/2014   Procedure: TRANSESOPHAGEAL ECHOCARDIOGRAM (TEE);  Surgeon: Melrose Nakayama, MD;  Location: [facility];  Service: Open Heart Surgery;  Laterality: N/A;  . TRANSTHORACIC ECHOCARDIOGRAM  07/23/2012   EF 55-60%, normal-mild  . TUBAL LIGATION      Allergies  Allergies  Allergen Reactions  . Ivp Dye [Iodinated Diagnostic Agents] Shortness Of Breath    No reaction to PO contrast with non-ionic dye.06-25-2014/rsm  . Shellfish Allergy Anaphylaxis  . Shellfish-Derived Products Anaphylaxis  . Sulfa Antibiotics Shortness Of Breath  . Iodine Hives  . Atorvastatin Other (See Comments)    Pt states "  causes bilateral leg pain/cramps."  . Benadryl [Diphenhydramine] Other (See Comments)    "THIS DRIVES ME CRAZY AND MAKES ME FEEL LIKE I AM DYING"  . Doxycycline Other (See Comments)    Unknown reaction, per patient  . Cephalexin Itching and Rash  . Zithromax [Azithromycin] Rash    History of Present Illness    Aleecia Tapia has a PMH of coronary artery disease status post CABG 10/16 (LIMA-LAD, SVG-D1, SVG-OM1, and OM2, SVG-PDA), chronic diastolic CHF, morbid obesity, type 2 diabetes, essential hypertension, chronic kidney disease stage III, gout, OSA, asthma, nonischemic cardiomyopathy.  She also has complete heart block and have a conventional pacemaker implanted in 2001.  Her device was upgraded to a  biventricular ICD in 2008.  Her EF at that time was 20%.  She was downgraded to CRT-P in 2014 due to excellent response to resynchronization.  Of note her current atrial and right ventricular leads were implanted in 2001 and her left ventricular lead implanted in 2008.  The defibrillator lead implanted in 2008 was capped and its tip was resected at the time of bypass surgery.  Since her bypass surgery her atrial pacing thresholds have been extremely high, and her device is programmed to VDD.  She developed heart failure symptoms in the fall 2020.  Echocardiogram 01/18/2019 showed an EF down to 40-45%, her left atrial pressures were also increased.  She was seen in the clinic on 01/27/2019 and it was noted that she had lost left ventricular capture.  During that time her output was increased and her coronary sinus lead.  Of note, [employer] released and alert about possible unexpected reset of pacemaker generator, and increased sudden battery drain during one of the transmissions.  Device checks are no longer being done remotely.  They are only being conducted in the office.  During her 6/21 visit it was estimated that she had around 3 years of battery life and had 100% biventricular pacing.  Her device was programmed to DDD due to failure of the atrial lead.  She reported 1 episode of brief paroxysmal atrial tachycardia and a single 4 beat run of NSVT since her last device check.  During her last visit with Dr. [name] 08/08/2019 she reported feeling much better.  She had been trying hard to eat healthy and have lost around 30 pounds.  Her breathing has also improved.  She did report having to take prednisone for a gout attack.  She also reported that her blood sugar had not elevated as high with her recent dose of prednisone.  She presents to the clinic today for follow-up evaluation states***  Today she denies chest pain, shortness of breath, lower extremity edema, fatigue, palpitations, melena,  hematuria, hemoptysis, diaphoresis, weakness, presyncope, syncope, orthopnea, and PND.   Home Medications    Prior to Admission medications   Medication Sig Start Date End Date Taking? Authorizing Provider  ACCU-CHEK AVIVA PLUS test strip  10/19/19   [provider]  Accu-Chek Softclix Lancets lancets  08/10/19   [provider]  acetaminophen (TYLENOL) 500 MG tablet Take 500-1,000 mg by mouth every 12 (twelve) hours as needed (for pain or headaches).     [provider]  albuterol (PROVENTIL HFA;VENTOLIN HFA) 108 (90 Base) MCG/ACT inhaler Inhale 2 puffs into the lungs every 6 (six) hours as needed for wheezing or shortness of breath. 05/02/18   Fuller Plan A, MD  albuterol (PROVENTIL) (2.5 MG/3ML) 0.083% nebulizer solution Take 3 mLs (2.5 mg total) by nebulization  every 6 (six) hours as needed for wheezing or shortness of breath. 05/02/18   Norval Morton, MD  Alcohol Swabs (B-D SINGLE USE SWABS REGULAR) PADS  08/10/19   [provider]  allopurinol (ZYLOPRIM) 100 MG tablet Take 1 tablet (100 mg total) by mouth daily. 12/02/19   Croitoru, [name], MD  aspirin EC 81 MG tablet Take 1 tablet (81 mg total) by mouth daily. 03/27/16   Erlene Quan, PA-C  Biotin 1000 MCG tablet Take 1,000 mcg by mouth daily.    [provider]  bisoprolol (ZEBETA) 5 MG tablet Take 1 tablet (5 mg total) by mouth daily. 03/06/20   Croitoru, [name], MD  budesonide-formoterol (SYMBICORT) 160-4.5 MCG/ACT inhaler Inhale 2 puffs into the lungs 2 (two) times daily.    [provider]  cetirizine (ZYRTEC) 10 MG tablet Take 10 mg by mouth daily.  03/24/17   [provider]  clopidogrel (PLAVIX) 75 MG tablet Take 1 tablet (75 mg total) by mouth daily. 03/06/20   Croitoru, [name], MD  clotrimazole (LOTRIMIN) 1 % cream Apply 1 application topically 2 (two) times daily as needed (rash). 05/02/18   Norval Morton, MD  clotrimazole (MYCELEX) 10 MG troche Take 10 mg by mouth  daily as needed (Sore Mouth/thrush).     [provider]  colchicine 0.6 MG tablet Take 0.6 mg by mouth 2 (two) times daily as needed (for gout flares).    [provider]  cycloSPORINE (RESTASIS) 0.05 % ophthalmic emulsion Place 1 drop into both eyes 2 (two) times daily.    [provider]  diclofenac sodium (VOLTAREN) 1 % GEL Apply 2 g topically 4 (four) times daily. Patient taking differently: Apply 2 g topically 4 (four) times daily as needed (pain.).  05/02/18   Norval Morton, MD  empagliflozin (JARDIANCE) 10 MG TABS tablet Take 10 mg by mouth daily before breakfast. 06/30/19   Bensimhon, Shaune Pascal, MD  EPINEPHrine (EPIPEN 2-PAK) 0.3 mg/0.3 mL IJ SOAJ injection Inject 0.3 mg into the muscle once as needed (severe allergic reaction).    [provider]  febuxostat (ULORIC) 40 MG tablet Take 40 mg by mouth daily. 11/10/17   [provider]  fluconazole (DIFLUCAN) 100 MG tablet Take 100 mg by mouth daily. 11/28/19   [provider]  guaiFENesin (MUCINEX) 600 MG 12 hr tablet Take 600 mg by mouth at bedtime as needed for cough or to loosen phlegm.     [provider]  hydrocortisone 2.5 % cream Apply 1 application topically daily as needed (itching).  01/08/18   [provider]  hydroxypropyl methylcellulose / hypromellose (ISOPTO TEARS / GONIOVISC) 2.5 % ophthalmic solution Place 1 drop into both eyes 3 (three) times daily.     [provider]  ipratropium (ATROVENT) 0.02 % nebulizer solution Take 2.5 mLs (0.5 mg total) by nebulization 4 (four) times daily. Patient taking differently: Take 0.5 mg by nebulization every 6 (six) hours as needed for wheezing or shortness of breath.  05/02/18   Norval Morton, MD  isosorbide mononitrate (IMDUR) 30 MG 24 hr tablet Take 3 tablets (90 mg total) by mouth daily. 03/06/20   Croitoru, [name], MD  isosorbide mononitrate (IMDUR) 60 MG 24 hr tablet TAKE 1 TABLET EVERY DAY 09/19/19    Croitoru, [name], MD  loratadine (CLARITIN) 10 MG tablet Take 10 mg by mouth daily. 11/23/19   [provider]  metolazone (ZAROXOLYN) 2.5 MG tablet Take 2.5 mg by mouth 2 (two)  times a week. Monday and Thursday    [provider]  nitroGLYCERIN (NITROSTAT) 0.4 MG SL tablet Place 1 tablet (0.4 mg total) under the tongue every 5 (five) minutes as needed for chest pain. 11/13/16 03/03/20  Erlene Quan, PA-C  ondansetron (ZOFRAN) 4 MG tablet Take 4 mg by mouth every 8 (eight) hours as needed. 09/10/19   [provider]  ondansetron (ZOFRAN-ODT) 4 MG disintegrating tablet Take 4 mg by mouth every 8 (eight) hours as needed for nausea or vomiting.    [provider]  ONGLYZA 2.5 MG TABS tablet  07/01/19   [provider]  OXYGEN Inhale 3 L/min into the lungs continuous.     [provider]  pantoprazole (PROTONIX) 40 MG tablet Take 1 tablet (40 mg total) by mouth 2 (two) times daily. 07/08/19   [name], MD  polyethylene glycol (MIRALAX / GLYCOLAX) 17 g packet Take 17 g by mouth in the morning.     [provider]  potassium chloride SA (KLOR-CON) 20 MEQ tablet Take 20 mEq by mouth 2 (two) times daily.    [provider]  predniSONE (DELTASONE) 10 MG tablet Take 10 mg by mouth daily as needed (as directed for gout flares).  02/15/19   [provider]  ranolazine (RANEXA) 1000 MG SR tablet TAKE 1 TABLET BY MOUTH  TWICE DAILY 01/17/20   Croitoru, [name], MD  rosuvastatin (CRESTOR) 20 MG tablet Take 1 tablet (20 mg total) by mouth daily. 11/29/19   Croitoru, [name], MD  spironolactone (ALDACTONE) 25 MG tablet Take 1 tablet (25 mg total) by mouth at bedtime. 11/29/19   Croitoru, [name], MD  tiZANidine (ZANAFLEX) 4 MG tablet  10/15/19   [provider]  torsemide (DEMADEX) 20 MG tablet Take 3 tablets by mouth once daily 08/11/19   Croitoru, [name], MD  traMADol (ULTRAM) 50 MG tablet Take 50 mg by mouth 3 (three) times daily  as needed. 09/14/19   [provider]  triamcinolone cream (KENALOG) 0.1 % Apply 1 application topically 2 (two) times daily as needed (itching).  08/24/14   [provider]    Family History    Family History  Problem Relation Age of Onset  . Breast cancer Mother   . Diabetes Mother   . Heart disease Maternal Grandmother   . Kidney disease Maternal Grandmother   . Diabetes Maternal Grandmother   . Glaucoma Maternal Aunt   . Heart disease Maternal Aunt   . Asthma Sister   . Colon cancer Neg Hx   . Stomach cancer Neg Hx   . Pancreatic cancer Neg Hx    She indicated that the status of her mother is unknown. She indicated that the status of her sister is unknown. She indicated that her maternal grandmother is deceased. She indicated that her maternal grandfather is deceased. She indicated that her paternal grandmother is deceased. She indicated that her paternal grandfather is deceased. She indicated that the status of her neg hx is unknown.  Social History    Social History   Socioeconomic History  . Marital status: Widowed    Spouse name: Not on file  . Number of children: 5  . Years of education: Not on file  . Highest education level: Not on file  Occupational History  . Occupation: STAFF/BUFFET    Employer: [employer]  Tobacco Use  . Smoking status: Former Smoker    Packs/day: 0.25    Years: 3.00  Pack years: 0.75    Types: Cigarettes    Quit date: 02/11/1968    Years since quitting: 52.1  . Smokeless tobacco: Never Used  Vaping Use  . Vaping Use: Never used  Substance and Sexual Activity  . Alcohol use: No  . Drug use: No  . Sexual activity: Never  Other Topics Concern  . Not on file  Social History Narrative   Widowed last year. She lives with her two sons.   Social Determinants of Health   Financial Resource Strain: Not on file  Food Insecurity: Not on file  Transportation Needs: Not on file  Physical Activity: Not on file   Stress: Not on file  Social Connections: Not on file  Intimate Partner Violence: Not on file     Review of Systems    General:  No chills, fever, night sweats or weight changes.  Cardiovascular:  No chest pain, dyspnea on exertion, edema, orthopnea, palpitations, paroxysmal nocturnal dyspnea. Dermatological: No rash, lesions/masses Respiratory: No cough, dyspnea Urologic: No hematuria, dysuria Abdominal:   No nausea, vomiting, diarrhea, bright red blood per rectum, melena, or hematemesis Neurologic:  No visual changes, wkns, changes in mental status. All other systems reviewed and are otherwise negative except as noted above.  Physical Exam    VS:  There were no vitals taken for this visit. , BMI There is no height or weight on file to calculate BMI. GEN: Well nourished, well developed, in no acute distress. HEENT: normal. Neck: Supple, no JVD, carotid bruits, or masses. Cardiac: RRR, no murmurs, rubs, or gallops. No clubbing, cyanosis, edema.  Radials/DP/PT 2+ and equal bilaterally.  Respiratory:  Respirations regular and unlabored, clear to auscultation bilaterally. GI: Soft, nontender, nondistended, BS + x 4. MS: no deformity or atrophy. Skin: warm and dry, no rash. Neuro:  Strength and sensation are intact. Psych: Normal affect.  Accessory Clinical Findings    Recent Labs: 05/31/2019: Hemoglobin 11.7; Platelets 182 06/01/2019: ALT 18; TSH 2.733 06/02/2019: B Natriuretic Peptide 333.7; BUN 51; Creatinine, Ser 2.19; Magnesium 2.1; Potassium 3.7; Sodium 138   Recent Lipid Panel    Component Value Date/Time   CHOL 186 01/21/2019 1045   TRIG 254 (H) 01/21/2019 1045   HDL 44 01/21/2019 1045   CHOLHDL 4.2 01/21/2019 1045   CHOLHDL 4.3 10/31/2016 0314   VLDL 36 10/31/2016 0314   LDLCALC 99 01/21/2019 1045    ECG personally reviewed by me today- *** - No acute changes  Echocardiogram 06/01/2019  IMPRESSIONS    1. Left ventricular ejection fraction, by estimation, is  50 to 55%. The  left ventricle has low normal function. Left ventricular endocardial  border not optimally defined to evaluate regional wall motion. Left  ventricular diastolic function could not be  evaluated.  2. Right ventricular systolic function is normal. The right ventricular  size is normal. Tricuspid regurgitation signal is inadequate for assessing  PA pressure.  3. Left atrial size was mild to moderately dilated.  4. The mitral valve is degenerative. No evidence of mitral valve  regurgitation. No evidence of mitral stenosis.  5. The aortic valve is tricuspid. Aortic valve regurgitation is not  visualized. Mild aortic valve stenosis. Aortic valve area, by VTI measures  1.78 cm. Aortic valve mean gradient measures 10.2 mmHg. Aortic valve Vmax  measures 2.11 m/s.  6. The inferior vena cava is normal in size with greater than 50%  respiratory variability, suggesting right atrial pressure of 3 mmHg.   Comparison(s): Changes from prior study  are noted. EF now low-normal,  50-55%. Very difficult study.   Assessment & Plan   1.  Chronic combined systolic and diastolic CHF-euvolemic today.  No increased DOE or activity intolerance. Continue bisoprolol, metolazone, torsemide, potassium, Jardiance Heart healthy low-sodium diet-salty 6 given Increase physical activity as tolerated Daily weights-contact office with a weight increase of 3 pounds overnight or 5 pounds in 1 week Lower extremity support stockings-[place] support stocking sheet given.  Coronary artery disease status post CABG-no chest pain today.  Remains physically active. Continue bisoprolol, Ranexa, Imdur, Jardiance Heart healthy low-sodium diet-salty 6 given Increase physical activity as tolerated  Complete heart block-pacemaker dependent.  Heart rate today***. No recent episodes of accelerated heart rate or palpitations. Continue to monitor  CRT-P-advisory from [place] reported that remote device  checks could alter device settings.  Planned office only checks every 3 months.  Hyperlipidemia-LDL at 254 on 01/21/2019. Continue rosuvastatin Heart healthy low-sodium high-fiber diet.   Increase physical activity as tolerated Repeat fasting lipid panel  CKD stage III-creatinine 2.19 on 06/02/2019.  Plan to continue to avoid metolazone due to renal function and gout.  Disposition: Follow-up with Dr. [name] 04/30/2020  Jossie Ng. Alyss Granato NP-C    03/13/2020, 1:09 PM [facility] [place] [address] Office [phone] Fax [phone]  Notice: This dictation was prepared with Dragon dictation along with smaller phrase technology. Any transcriptional errors that result from this process are unintentional and may not be corrected upon review.  I spent***minutes examining this patient, reviewing medications, and using patient centered shared decision making involving her cardiac care.  Prior to her visit I spent greater than 20 minutes reviewing her past medical history,  medications, and prior cardiac tests.

## 2020-03-15 ENCOUNTER — Ambulatory Visit: Payer: Medicare Other | Admitting: General Practice

## 2020-04-01 ENCOUNTER — Emergency Department (HOSPITAL_COMMUNITY)
Admission: EM | Admit: 2020-04-01 | Discharge: 2020-04-01 | Disposition: A | Discharge status: Home / Self Care | Payer: Medicare Other | Attending: Emergency Medicine | Admitting: Emergency Medicine

## 2020-04-01 ENCOUNTER — Emergency Department (HOSPITAL_COMMUNITY): Payer: Medicare Other

## 2020-04-01 DIAGNOSIS — S0083XA Contusion of other part of head, initial encounter: Secondary | ICD-10-CM

## 2020-04-01 DIAGNOSIS — S22058A Other fracture of T5-T6 vertebra, initial encounter for closed fracture: Secondary | ICD-10-CM

## 2020-04-01 DIAGNOSIS — S22068A Other fracture of T7-T8 thoracic vertebra, initial encounter for closed fracture: Secondary | ICD-10-CM

## 2020-04-01 DIAGNOSIS — W19XXXA Unspecified fall, initial encounter: Secondary | ICD-10-CM

## 2020-04-01 DIAGNOSIS — Z043 Encounter for examination and observation following other accident: Secondary | ICD-10-CM | POA: Diagnosis not present

## 2020-04-01 DIAGNOSIS — S0990XA Unspecified injury of head, initial encounter: Secondary | ICD-10-CM | POA: Diagnosis not present

## 2020-04-01 DIAGNOSIS — Z7902 Long term (current) use of antithrombotics/antiplatelets: Secondary | ICD-10-CM | POA: Diagnosis not present

## 2020-04-01 DIAGNOSIS — M549 Dorsalgia, unspecified: Secondary | ICD-10-CM | POA: Diagnosis not present

## 2020-04-01 DIAGNOSIS — Z743 Need for continuous supervision: Secondary | ICD-10-CM | POA: Diagnosis not present

## 2020-04-01 DIAGNOSIS — M542 Cervicalgia: Secondary | ICD-10-CM | POA: Diagnosis not present

## 2020-04-01 DIAGNOSIS — R609 Edema, unspecified: Secondary | ICD-10-CM | POA: Diagnosis not present

## 2020-04-01 DIAGNOSIS — S199XXA Unspecified injury of neck, initial encounter: Secondary | ICD-10-CM | POA: Diagnosis not present

## 2020-04-01 DIAGNOSIS — S22069A Unspecified fracture of T7-T8 vertebra, initial encounter for closed fracture: Secondary | ICD-10-CM | POA: Insufficient documentation

## 2020-04-01 DIAGNOSIS — S299XXA Unspecified injury of thorax, initial encounter: Secondary | ICD-10-CM | POA: Diagnosis present

## 2020-04-01 DIAGNOSIS — I10 Essential (primary) hypertension: Secondary | ICD-10-CM | POA: Diagnosis not present

## 2020-04-01 DIAGNOSIS — S2242XA Multiple fractures of ribs, left side, initial encounter for closed fracture: Secondary | ICD-10-CM | POA: Diagnosis not present

## 2020-04-01 DIAGNOSIS — S0011XA Contusion of right eyelid and periocular area, initial encounter: Secondary | ICD-10-CM | POA: Diagnosis not present

## 2020-04-01 DIAGNOSIS — S22059A Unspecified fracture of T5-T6 vertebra, initial encounter for closed fracture: Secondary | ICD-10-CM | POA: Diagnosis not present

## 2020-04-01 LAB — CBC WITH DIFFERENTIAL/PLATELET
Abs Immature Granulocytes: 0.12 10*3/uL — ABNORMAL HIGH (ref 0.00–0.07)
Basophils Absolute: 0.1 10*3/uL (ref 0.0–0.1)
Basophils Relative: 1 %
Eosinophils Absolute: 0.2 10*3/uL (ref 0.0–0.5)
Eosinophils Relative: 2 %
HCT: 45.2 % (ref 36.0–46.0)
Hemoglobin: 13.6 g/dL (ref 12.0–15.0)
Immature Granulocytes: 1 %
Lymphocytes Relative: 13 %
Lymphs Abs: 1.3 10*3/uL (ref 0.7–4.0)
MCH: 29.2 pg (ref 26.0–34.0)
MCHC: 30.1 g/dL (ref 30.0–36.0)
MCV: 97 fL (ref 80.0–100.0)
Monocytes Absolute: 0.7 10*3/uL (ref 0.1–1.0)
Monocytes Relative: 6 %
Neutro Abs: 8 10*3/uL — ABNORMAL HIGH (ref 1.7–7.7)
Neutrophils Relative %: 77 %
Platelets: 167 10*3/uL (ref 150–400)
RBC: 4.66 MIL/uL (ref 3.87–5.11)
RDW: 14.6 % (ref 11.5–15.5)
WBC: 10.3 10*3/uL (ref 4.0–10.5)
nRBC: 0 % (ref 0.0–0.2)

## 2020-04-01 LAB — COMPREHENSIVE METABOLIC PANEL
ALT: 20 U/L (ref 0–44)
AST: 19 U/L (ref 15–41)
Albumin: 3.3 g/dL — ABNORMAL LOW (ref 3.5–5.0)
Alkaline Phosphatase: 79 U/L (ref 38–126)
Anion gap: 7 (ref 5–15)
BUN: 37 mg/dL — ABNORMAL HIGH (ref 8–23)
CO2: 28 mmol/L (ref 22–32)
Calcium: 8.5 mg/dL — ABNORMAL LOW (ref 8.9–10.3)
Chloride: 101 mmol/L (ref 98–111)
Creatinine, Ser: 1.98 mg/dL — ABNORMAL HIGH (ref 0.44–1.00)
GFR, Estimated: 26 mL/min — ABNORMAL LOW (ref >60)
Glucose, Bld: 180 mg/dL — ABNORMAL HIGH (ref 70–99)
Potassium: 5 mmol/L (ref 3.5–5.1)
Sodium: 136 mmol/L (ref 135–145)
Total Bilirubin: 0.6 mg/dL (ref 0.3–1.2)
Total Protein: 6.4 g/dL — ABNORMAL LOW (ref 6.5–8.1)

## 2020-04-01 LAB — PROTIME-INR
INR: 0.9 (ref 0.8–1.2)
Prothrombin Time: 12.1 seconds (ref 11.4–15.2)

## 2020-04-01 MED ORDER — OXYCODONE HCL 5 MG PO TABS
5.0000 mg | ORAL_TABLET | Freq: Once | ORAL | Status: AC
  Administered 2020-04-01: 5 mg via ORAL
  Filled 2020-04-01: qty 1

## 2020-04-01 MED ORDER — HYDROCODONE-ACETAMINOPHEN 5-325 MG PO TABS
1.0000 | ORAL_TABLET | Freq: Once | ORAL | Status: AC
  Administered 2020-04-01: 1 via ORAL
  Filled 2020-04-01: qty 1

## 2020-04-01 MED ORDER — HYDROCODONE-ACETAMINOPHEN 5-325 MG PO TABS: 1.0000 | ORAL_TABLET | ORAL | 0 refills | Status: DC | PRN

## 2020-04-01 NOTE — Progress Notes (Signed)
Orthopedic Tech Progress Note Patient Details:  [name] 09-28-1945 [id number] Attempted to apply TLSO brace to patient but I could not get it to fit properly, I called [facility] fitting specialist to come and apply brace correctly. Patient ID: [name], female   DOB: Aug 01, 1945, 75 y.o.   MRN: [id number]   Tammy Sours 04/01/2020, 3:29 PM

## 2020-04-01 NOTE — ED Provider Notes (Signed)
[facility] EMERGENCY DEPARTMENT Provider Note   CSN: [id number] Arrival date & time: 04/01/20  1152     History Chief Complaint  Patient presents with  . level 2  . Fall    [name] is a 75 y.o. female.  75 year old female brought in by EMS for evaluation after a fall, complex medical history as below, reportedly on plavix and coumadin. Patient states she tripped over her boots and fell, hitting her face/right cheek on the table. Denies LOC, reports pain in her face, neck, upper back. Denies upper or lower extremity pain, denies LOC. No other injuries.        History reviewed in patient's other medical record, pending merge of records.   OB History   No obstetric history on file.     No family history on file.     Home Medications Prior to Admission medications   Medication Sig Start Date End Date Taking? Authorizing Provider  HYDROcodone-acetaminophen (NORCO/VICODIN) 5-325 MG tablet Take 1 tablet by mouth every 4 (four) hours as needed for moderate pain. 04/01/20  Yes Tacy Learn, PA-C    Allergies    Allopurinol, Azithromycin, Contrast media [iodinated diagnostic agents], Doxycycline hyclate, Keflex [cephalexin], Shellfish allergy, Sulfa antibiotics, and Uloric [febuxostat]  Review of Systems   Review of Systems  Constitutional: Negative for fever.  Eyes: Negative for visual disturbance.  Respiratory: Negative for shortness of breath.   Cardiovascular: Negative for chest pain.  Gastrointestinal: Negative for abdominal pain.  Musculoskeletal: Positive for back pain and neck pain. Negative for arthralgias and joint swelling.  Skin: Positive for wound.  Neurological: Negative for dizziness, weakness, numbness and headaches.  Hematological: Bruises/bleeds easily.  Psychiatric/Behavioral: Negative for confusion.  All other systems reviewed and are negative.   Physical Exam Updated Vital Signs BP 113/63   Pulse 79   Temp 98.7 F  (37.1 C) (Oral)   Resp (!) 28   Ht 5\' 2"  (1.575 m)   Wt 108.9 kg   SpO2 96%   BMI 43.90 kg/m   Physical Exam Vitals and nursing note reviewed.  Constitutional:      General: She is not in acute distress.    Appearance: She is well-developed and well-nourished. She is not diaphoretic.  HENT:     Head: Normocephalic.      Nose: Nose normal.     Mouth/Throat:     Mouth: Mucous membranes are moist.  Eyes:     Extraocular Movements: Extraocular movements intact.     Pupils: Pupils are equal, round, and reactive to light.  Cardiovascular:     Rate and Rhythm: Normal rate and regular rhythm.     Heart sounds: Normal heart sounds.  Pulmonary:     Effort: Pulmonary effort is normal.     Breath sounds: Normal breath sounds.  Chest:     Chest wall: No tenderness.  Abdominal:     Palpations: Abdomen is soft.     Tenderness: There is no abdominal tenderness.  Musculoskeletal:        General: Tenderness present. No swelling or deformity.     Cervical back: Tenderness and bony tenderness present.     Thoracic back: Tenderness and bony tenderness present.     Lumbar back: No tenderness or bony tenderness.       Back:     Comments: No pain with palpation or ROM upper and lower extremities, equal grip and leg strength  Skin:    General: Skin  is warm and dry.     Findings: No erythema or rash.  Neurological:     Mental Status: She is alert and oriented to person, place, and time.     Sensory: No sensory deficit.     Motor: No weakness.  Psychiatric:        Mood and Affect: Mood and affect normal.        Behavior: Behavior normal.     ED Results / Procedures / Treatments   Labs (all labs ordered are listed, but only abnormal results are displayed) Labs Reviewed  COMPREHENSIVE METABOLIC PANEL - Abnormal; Notable for the following components:      Result Value   Glucose, Bld 180 (*)    BUN 37 (*)    Creatinine, Ser 1.98 (*)    Calcium 8.5 (*)    Total Protein 6.4 (*)     Albumin 3.3 (*)    GFR, Estimated 26 (*)    All other components within normal limits  CBC WITH DIFFERENTIAL/PLATELET - Abnormal; Notable for the following components:   Neutro Abs 8.0 (*)    Abs Immature Granulocytes 0.12 (*)    All other components within normal limits  PROTIME-INR    EKG EKG Interpretation  Date/Time:  Sunday April 01 2020 12:00:52 EST Ventricular Rate:  80 PR Interval:    QRS Duration: 167 QT Interval:  462 QTC Calculation: 533 R Axis:   20 Text Interpretation: Atrial-sensed ventricular-paced rhythm Probable left atrial enlargement Right bundle branch block Left ventricular hypertrophy Confirmed by Davonna Belling [phone]) on 04/01/2020 1:46:15 PM   Radiology CT Head Wo Contrast  Result Date: 04/01/2020 CLINICAL DATA:  Trauma. EXAM: CT HEAD WITHOUT CONTRAST CT MAXILLOFACIAL WITHOUT CONTRAST CT CERVICAL SPINE WITHOUT CONTRAST TECHNIQUE: Multidetector CT imaging of the head, cervical spine, and maxillofacial structures were performed using the standard protocol without intravenous contrast. Multiplanar CT image reconstructions of the cervical spine and maxillofacial structures were also generated. COMPARISON:  Head CT 10/27/2018.  Cervical spine CT 10/22/2015. FINDINGS: CT HEAD FINDINGS Brain: There is no evidence of an acute infarct, intracranial hemorrhage, mass, midline shift, or extra-axial fluid collection. Mild cerebral atrophy is within normal limits for age. Hypodensities in the cerebral white matter bilaterally are unchanged and nonspecific but compatible with mild chronic small vessel ischemic disease. There is an unchanged chronic lacunar infarct in the left caudate nucleus. Vascular: Calcified atherosclerosis at the skull base. Skull: No fracture or suspicious osseous lesion. Other: Right frontal scalp hematoma. CT MAXILLOFACIAL FINDINGS Osseous: No acute fracture, mandibular dislocation, or destructive osseous process. Orbits: Bilateral cataract  extraction. Moderately large right periorbital hematoma extending inferiorly into the right cheek. No retro bulbar hematoma. Sinuses: Paranasal sinuses and mastoid air cells are clear. Soft tissues: No additional acute findings. CT CERVICAL SPINE FINDINGS Alignment: Chronic straightening of the normal cervical lordosis. Minimal anterolisthesis of C7 on T1, likely degenerative. Skull base and vertebrae: No acute fracture or suspicious osseous lesion. C4-5 ACDF. Prominent median C1-2 arthropathy. Soft tissues and spinal canal: No prevertebral fluid or swelling. No visible canal hematoma. Disc levels: Advanced disc degeneration at C5-6, C6-7, and T2-3. Advanced facet arthrosis at multiple levels in the cervical and included upper thoracic spine. Moderate spinal stenosis at C5-6 and C6-7 due to broad-based posterior disc osteophyte complexes. Upper chest: Clear lung apices. Other: Retropharyngeal course of the carotid arteries with moderate calcified plaque. IMPRESSION: 1. No evidence of acute intracranial abnormality. 2. Right periorbital hematoma extending into the cheek and frontal scalp. 3.  No acute maxillofacial or cervical spine fracture. Electronically Signed   By: Logan Bores M.D.   On: 04/01/2020 13:24   CT Cervical Spine Wo Contrast  Result Date: 04/01/2020 CLINICAL DATA:  Trauma. EXAM: CT HEAD WITHOUT CONTRAST CT MAXILLOFACIAL WITHOUT CONTRAST CT CERVICAL SPINE WITHOUT CONTRAST TECHNIQUE: Multidetector CT imaging of the head, cervical spine, and maxillofacial structures were performed using the standard protocol without intravenous contrast. Multiplanar CT image reconstructions of the cervical spine and maxillofacial structures were also generated. COMPARISON:  Head CT 10/27/2018.  Cervical spine CT 10/22/2015. FINDINGS: CT HEAD FINDINGS Brain: There is no evidence of an acute infarct, intracranial hemorrhage, mass, midline shift, or extra-axial fluid collection. Mild cerebral atrophy is within normal  limits for age. Hypodensities in the cerebral white matter bilaterally are unchanged and nonspecific but compatible with mild chronic small vessel ischemic disease. There is an unchanged chronic lacunar infarct in the left caudate nucleus. Vascular: Calcified atherosclerosis at the skull base. Skull: No fracture or suspicious osseous lesion. Other: Right frontal scalp hematoma. CT MAXILLOFACIAL FINDINGS Osseous: No acute fracture, mandibular dislocation, or destructive osseous process. Orbits: Bilateral cataract extraction. Moderately large right periorbital hematoma extending inferiorly into the right cheek. No retro bulbar hematoma. Sinuses: Paranasal sinuses and mastoid air cells are clear. Soft tissues: No additional acute findings. CT CERVICAL SPINE FINDINGS Alignment: Chronic straightening of the normal cervical lordosis. Minimal anterolisthesis of C7 on T1, likely degenerative. Skull base and vertebrae: No acute fracture or suspicious osseous lesion. C4-5 ACDF. Prominent median C1-2 arthropathy. Soft tissues and spinal canal: No prevertebral fluid or swelling. No visible canal hematoma. Disc levels: Advanced disc degeneration at C5-6, C6-7, and T2-3. Advanced facet arthrosis at multiple levels in the cervical and included upper thoracic spine. Moderate spinal stenosis at C5-6 and C6-7 due to broad-based posterior disc osteophyte complexes. Upper chest: Clear lung apices. Other: Retropharyngeal course of the carotid arteries with moderate calcified plaque. IMPRESSION: 1. No evidence of acute intracranial abnormality. 2. Right periorbital hematoma extending into the cheek and frontal scalp. 3. No acute maxillofacial or cervical spine fracture. Electronically Signed   By: Logan Bores M.D.   On: 04/01/2020 13:24   CT Thoracic Spine Wo Contrast  Result Date: 04/01/2020 CLINICAL DATA:  Back trauma. EXAM: CT THORACIC SPINE WITHOUT CONTRAST TECHNIQUE: Multidetector CT images of the thoracic were obtained using  the standard protocol without intravenous contrast. COMPARISON:  Thoracic spine radiographs 09/25/2012 FINDINGS: Alignment: Normal. Vertebrae: Interbody ankylosis extending from T2 into the upper lumbar spine. Acute predominantly horizontally oriented acute fracture through the inferior aspect of the T6 vertebral body extending into the inferior endplate and disc space with mild distraction anteriorly. The fracture also likely involves the far lateral aspect of the T7 vertebral body on the left. No posterior element fracture is identified. Deformities of the T7 and T8 vertebrae which may reflect remote, healed fractures. Old posterior left eleventh rib fracture. Paraspinal and other soft tissues: Small volume hematoma in the paravertebral soft tissues at T6. partially visualized ICD leads. Aortic atherosclerosis. Disc levels: Diffuse thoracic ankylosis. Prominent degenerative endplate changes at B7-6 with endplate and facet spurring resulting in mild right and moderate left neural foraminal stenosis. IMPRESSION: 1. Horizontal, mildly distracted fracture of the T6 vertebral body involving the inferior endplate, T6-7 disc space, and far lateral aspect of the T7 vertebral body in the setting of diffuse intervertebral ankylosis. No posterior element fracture identified. 2. Aortic Atherosclerosis (ICD10-I70.0). Electronically Signed   By: Seymour Bars.D.  On: 04/01/2020 13:12   DG Chest Port 1 View  Result Date: 04/01/2020 CLINICAL DATA:  Fall. EXAM: PORTABLE CHEST 1 VIEW COMPARISON:  05/31/2019 FINDINGS: Right chest wall ICD is noted with leads in the right atrial appendage and right ventricle. Previous median sternotomy. Aortic atherosclerosis. No pleural effusion or edema identified. No airspace densities. IMPRESSION: No active cardiopulmonary abnormalities. Electronically Signed   By: Kerby Moors M.D.   On: 04/01/2020 12:14   CT Maxillofacial WO CM  Result Date: 04/01/2020 CLINICAL DATA:  Trauma. EXAM:  CT HEAD WITHOUT CONTRAST CT MAXILLOFACIAL WITHOUT CONTRAST CT CERVICAL SPINE WITHOUT CONTRAST TECHNIQUE: Multidetector CT imaging of the head, cervical spine, and maxillofacial structures were performed using the standard protocol without intravenous contrast. Multiplanar CT image reconstructions of the cervical spine and maxillofacial structures were also generated. COMPARISON:  Head CT 10/27/2018.  Cervical spine CT 10/22/2015. FINDINGS: CT HEAD FINDINGS Brain: There is no evidence of an acute infarct, intracranial hemorrhage, mass, midline shift, or extra-axial fluid collection. Mild cerebral atrophy is within normal limits for age. Hypodensities in the cerebral white matter bilaterally are unchanged and nonspecific but compatible with mild chronic small vessel ischemic disease. There is an unchanged chronic lacunar infarct in the left caudate nucleus. Vascular: Calcified atherosclerosis at the skull base. Skull: No fracture or suspicious osseous lesion. Other: Right frontal scalp hematoma. CT MAXILLOFACIAL FINDINGS Osseous: No acute fracture, mandibular dislocation, or destructive osseous process. Orbits: Bilateral cataract extraction. Moderately large right periorbital hematoma extending inferiorly into the right cheek. No retro bulbar hematoma. Sinuses: Paranasal sinuses and mastoid air cells are clear. Soft tissues: No additional acute findings. CT CERVICAL SPINE FINDINGS Alignment: Chronic straightening of the normal cervical lordosis. Minimal anterolisthesis of C7 on T1, likely degenerative. Skull base and vertebrae: No acute fracture or suspicious osseous lesion. C4-5 ACDF. Prominent median C1-2 arthropathy. Soft tissues and spinal canal: No prevertebral fluid or swelling. No visible canal hematoma. Disc levels: Advanced disc degeneration at C5-6, C6-7, and T2-3. Advanced facet arthrosis at multiple levels in the cervical and included upper thoracic spine. Moderate spinal stenosis at C5-6 and C6-7 due to  broad-based posterior disc osteophyte complexes. Upper chest: Clear lung apices. Other: Retropharyngeal course of the carotid arteries with moderate calcified plaque. IMPRESSION: 1. No evidence of acute intracranial abnormality. 2. Right periorbital hematoma extending into the cheek and frontal scalp. 3. No acute maxillofacial or cervical spine fracture. Electronically Signed   By: Logan Bores M.D.   On: 04/01/2020 13:24    Procedures Procedures   Medications Ordered in ED Medications  HYDROcodone-acetaminophen (NORCO/VICODIN) 5-325 MG per tablet 1 tablet (1 tablet Oral Given 04/01/20 1315)  oxyCODONE (Oxy IR/ROXICODONE) immediate release tablet 5 mg (5 mg Oral Given 04/01/20 1405)    ED Course  I have reviewed the triage vital signs and the nursing notes.  Pertinent labs & imaging results that were available during my care of the patient were reviewed by me and considered in my medical decision making (see chart for details).  Clinical Course as of 04/01/20 1544  Sun Feb 20, 752  1334 75 year old female on Plavix brought in by EMS after mechanical fall, patient struck face on a table reports pain in her right cheek as well as neck and back.  No loss of consciousness. On exam found to have a large hematoma to right maxillary area involving right upper and lower eyelids, extraocular movements intact, pupils equal reactive. Tenderness noted to neck as well as mid T-spine, no L-spine tenderness.  Equal grip and leg strength, no extremity tingling or numbness.  CT obtained of head, maxillofacial, C-spine, T-spine. Patient is found to have T7 and T6 vertebral fractures.  Patient does have a pacemaker. Case discussed with Darron Doom, on-call with neurosurgery who has reviewed patient's images, recommends TLSO, pain management and follow-up with either patient's orthopedist (Dr. Lorin Mercy) or with neurosurgery. Discussed results and plan of care with patient who verbalizes understanding. [LM]     Clinical Course User Index [LM] Roque Lias   MDM Rules/Calculators/A&P                         Final Clinical Impression(s) / ED Diagnoses Final diagnoses:  Other closed fracture of sixth thoracic vertebra, initial encounter [facility])  Other closed fracture of seventh thoracic vertebra, initial encounter [facility])  Facial hematoma, initial encounter  Fall, initial encounter    Rx / DC Orders ED Discharge Orders         Ordered    HYDROcodone-acetaminophen (NORCO/VICODIN) 5-325 MG tablet  Every 4 hours PRN        04/01/20 1544           Roque Lias 04/01/20 1544    Davonna Belling, MD 04/01/20 2011

## 2020-04-01 NOTE — Discharge Instructions (Signed)
Follow up with Dr. Lorin Mercy or with neurosurgery (referral given). Call on Monday to schedule an appointment. Take Norco as needed as prescribed for pain. Take Colace as needed for constipation.  Return to the ER for severe or concerning symptoms.  You can apply cold compresses to your face for 20 minutes at a time to help with pain and swelling. Apply Arnica to face for bruising.

## 2020-04-02 DIAGNOSIS — R2689 Other abnormalities of gait and mobility: Secondary | ICD-10-CM | POA: Diagnosis not present

## 2020-04-03 DIAGNOSIS — M1711 Unilateral primary osteoarthritis, right knee: Secondary | ICD-10-CM | POA: Diagnosis not present

## 2020-04-07 ENCOUNTER — Other Ambulatory Visit: Payer: Self-pay | Admitting: Cardiovascular Disease

## 2020-04-08 DIAGNOSIS — I5022 Chronic systolic (congestive) heart failure: Secondary | ICD-10-CM | POA: Diagnosis not present

## 2020-04-08 DIAGNOSIS — J9612 Chronic respiratory failure with hypercapnia: Secondary | ICD-10-CM | POA: Diagnosis not present

## 2020-04-08 DIAGNOSIS — J9611 Chronic respiratory failure with hypoxia: Secondary | ICD-10-CM | POA: Diagnosis not present

## 2020-04-09 DIAGNOSIS — R609 Edema, unspecified: Secondary | ICD-10-CM | POA: Diagnosis not present

## 2020-04-09 DIAGNOSIS — S0083XA Contusion of other part of head, initial encounter: Secondary | ICD-10-CM | POA: Diagnosis not present

## 2020-04-11 DIAGNOSIS — S0083XD Contusion of other part of head, subsequent encounter: Secondary | ICD-10-CM | POA: Diagnosis not present

## 2020-04-11 DIAGNOSIS — M6281 Muscle weakness (generalized): Secondary | ICD-10-CM | POA: Diagnosis not present

## 2020-04-11 DIAGNOSIS — N183 Chronic kidney disease, stage 3 unspecified: Secondary | ICD-10-CM | POA: Diagnosis not present

## 2020-04-11 DIAGNOSIS — W19XXXD Unspecified fall, subsequent encounter: Secondary | ICD-10-CM | POA: Diagnosis not present

## 2020-04-11 DIAGNOSIS — R601 Generalized edema: Secondary | ICD-10-CM | POA: Diagnosis not present

## 2020-04-11 DIAGNOSIS — Z9981 Dependence on supplemental oxygen: Secondary | ICD-10-CM | POA: Diagnosis not present

## 2020-04-11 DIAGNOSIS — G8911 Acute pain due to trauma: Secondary | ICD-10-CM | POA: Diagnosis not present

## 2020-04-11 DIAGNOSIS — S22068D Other fracture of T7-T8 thoracic vertebra, subsequent encounter for fracture with routine healing: Secondary | ICD-10-CM | POA: Diagnosis not present

## 2020-04-11 DIAGNOSIS — Z7409 Other reduced mobility: Secondary | ICD-10-CM | POA: Diagnosis not present

## 2020-04-13 DIAGNOSIS — M79605 Pain in left leg: Secondary | ICD-10-CM | POA: Diagnosis not present

## 2020-04-16 DIAGNOSIS — M1000 Idiopathic gout, unspecified site: Secondary | ICD-10-CM | POA: Diagnosis not present

## 2020-04-16 DIAGNOSIS — N184 Chronic kidney disease, stage 4 (severe): Secondary | ICD-10-CM | POA: Diagnosis not present

## 2020-04-16 DIAGNOSIS — E1122 Type 2 diabetes mellitus with diabetic chronic kidney disease: Secondary | ICD-10-CM | POA: Diagnosis not present

## 2020-04-16 DIAGNOSIS — N179 Acute kidney failure, unspecified: Secondary | ICD-10-CM | POA: Diagnosis not present

## 2020-04-16 DIAGNOSIS — I509 Heart failure, unspecified: Secondary | ICD-10-CM | POA: Diagnosis not present

## 2020-04-16 DIAGNOSIS — I129 Hypertensive chronic kidney disease with stage 1 through stage 4 chronic kidney disease, or unspecified chronic kidney disease: Secondary | ICD-10-CM | POA: Diagnosis not present

## 2020-04-17 DIAGNOSIS — S0083XD Contusion of other part of head, subsequent encounter: Secondary | ICD-10-CM | POA: Diagnosis not present

## 2020-04-17 DIAGNOSIS — G8911 Acute pain due to trauma: Secondary | ICD-10-CM | POA: Diagnosis not present

## 2020-04-17 DIAGNOSIS — R601 Generalized edema: Secondary | ICD-10-CM | POA: Diagnosis not present

## 2020-04-17 DIAGNOSIS — Z9981 Dependence on supplemental oxygen: Secondary | ICD-10-CM | POA: Diagnosis not present

## 2020-04-17 DIAGNOSIS — S22058A Other fracture of T5-T6 vertebra, initial encounter for closed fracture: Secondary | ICD-10-CM | POA: Diagnosis not present

## 2020-04-17 DIAGNOSIS — S22068D Other fracture of T7-T8 thoracic vertebra, subsequent encounter for fracture with routine healing: Secondary | ICD-10-CM | POA: Diagnosis not present

## 2020-04-17 DIAGNOSIS — M6281 Muscle weakness (generalized): Secondary | ICD-10-CM | POA: Diagnosis not present

## 2020-04-17 DIAGNOSIS — W19XXXD Unspecified fall, subsequent encounter: Secondary | ICD-10-CM | POA: Diagnosis not present

## 2020-04-17 DIAGNOSIS — Z7409 Other reduced mobility: Secondary | ICD-10-CM | POA: Diagnosis not present

## 2020-04-17 DIAGNOSIS — N183 Chronic kidney disease, stage 3 unspecified: Secondary | ICD-10-CM | POA: Diagnosis not present

## 2020-04-18 DIAGNOSIS — N183 Chronic kidney disease, stage 3 unspecified: Secondary | ICD-10-CM | POA: Diagnosis not present

## 2020-04-18 DIAGNOSIS — G8911 Acute pain due to trauma: Secondary | ICD-10-CM | POA: Diagnosis not present

## 2020-04-18 DIAGNOSIS — S0083XD Contusion of other part of head, subsequent encounter: Secondary | ICD-10-CM | POA: Diagnosis not present

## 2020-04-18 DIAGNOSIS — R601 Generalized edema: Secondary | ICD-10-CM | POA: Diagnosis not present

## 2020-04-18 DIAGNOSIS — Z9981 Dependence on supplemental oxygen: Secondary | ICD-10-CM | POA: Diagnosis not present

## 2020-04-18 DIAGNOSIS — Z7409 Other reduced mobility: Secondary | ICD-10-CM | POA: Diagnosis not present

## 2020-04-18 DIAGNOSIS — S22068D Other fracture of T7-T8 thoracic vertebra, subsequent encounter for fracture with routine healing: Secondary | ICD-10-CM | POA: Diagnosis not present

## 2020-04-18 DIAGNOSIS — M6281 Muscle weakness (generalized): Secondary | ICD-10-CM | POA: Diagnosis not present

## 2020-04-18 DIAGNOSIS — W19XXXD Unspecified fall, subsequent encounter: Secondary | ICD-10-CM | POA: Diagnosis not present

## 2020-04-19 DIAGNOSIS — N183 Chronic kidney disease, stage 3 unspecified: Secondary | ICD-10-CM | POA: Diagnosis not present

## 2020-04-19 DIAGNOSIS — G8929 Other chronic pain: Secondary | ICD-10-CM | POA: Diagnosis not present

## 2020-04-19 DIAGNOSIS — W19XXXD Unspecified fall, subsequent encounter: Secondary | ICD-10-CM | POA: Diagnosis not present

## 2020-04-19 DIAGNOSIS — N184 Chronic kidney disease, stage 4 (severe): Secondary | ICD-10-CM | POA: Diagnosis not present

## 2020-04-19 DIAGNOSIS — E1169 Type 2 diabetes mellitus with other specified complication: Secondary | ICD-10-CM | POA: Diagnosis not present

## 2020-04-19 DIAGNOSIS — G8911 Acute pain due to trauma: Secondary | ICD-10-CM | POA: Diagnosis not present

## 2020-04-19 DIAGNOSIS — I2510 Atherosclerotic heart disease of native coronary artery without angina pectoris: Secondary | ICD-10-CM | POA: Diagnosis not present

## 2020-04-19 DIAGNOSIS — Z9981 Dependence on supplemental oxygen: Secondary | ICD-10-CM | POA: Diagnosis not present

## 2020-04-19 DIAGNOSIS — E7800 Pure hypercholesterolemia, unspecified: Secondary | ICD-10-CM | POA: Diagnosis not present

## 2020-04-19 DIAGNOSIS — E1122 Type 2 diabetes mellitus with diabetic chronic kidney disease: Secondary | ICD-10-CM | POA: Diagnosis not present

## 2020-04-19 DIAGNOSIS — S0083XD Contusion of other part of head, subsequent encounter: Secondary | ICD-10-CM | POA: Diagnosis not present

## 2020-04-19 DIAGNOSIS — I10 Essential (primary) hypertension: Secondary | ICD-10-CM | POA: Diagnosis not present

## 2020-04-19 DIAGNOSIS — R601 Generalized edema: Secondary | ICD-10-CM | POA: Diagnosis not present

## 2020-04-19 DIAGNOSIS — M6281 Muscle weakness (generalized): Secondary | ICD-10-CM | POA: Diagnosis not present

## 2020-04-19 DIAGNOSIS — J4520 Mild intermittent asthma, uncomplicated: Secondary | ICD-10-CM | POA: Diagnosis not present

## 2020-04-19 DIAGNOSIS — Z7409 Other reduced mobility: Secondary | ICD-10-CM | POA: Diagnosis not present

## 2020-04-19 DIAGNOSIS — I5032 Chronic diastolic (congestive) heart failure: Secondary | ICD-10-CM | POA: Diagnosis not present

## 2020-04-19 DIAGNOSIS — K219 Gastro-esophageal reflux disease without esophagitis: Secondary | ICD-10-CM | POA: Diagnosis not present

## 2020-04-19 DIAGNOSIS — S22068D Other fracture of T7-T8 thoracic vertebra, subsequent encounter for fracture with routine healing: Secondary | ICD-10-CM | POA: Diagnosis not present

## 2020-04-22 ENCOUNTER — Other Ambulatory Visit: Payer: Self-pay | Admitting: Cardiovascular Disease

## 2020-04-23 DIAGNOSIS — B379 Candidiasis, unspecified: Secondary | ICD-10-CM | POA: Diagnosis not present

## 2020-04-24 DIAGNOSIS — N183 Chronic kidney disease, stage 3 unspecified: Secondary | ICD-10-CM | POA: Diagnosis not present

## 2020-04-24 DIAGNOSIS — S0083XD Contusion of other part of head, subsequent encounter: Secondary | ICD-10-CM | POA: Diagnosis not present

## 2020-04-24 DIAGNOSIS — Z7409 Other reduced mobility: Secondary | ICD-10-CM | POA: Diagnosis not present

## 2020-04-24 DIAGNOSIS — G8911 Acute pain due to trauma: Secondary | ICD-10-CM | POA: Diagnosis not present

## 2020-04-24 DIAGNOSIS — W19XXXD Unspecified fall, subsequent encounter: Secondary | ICD-10-CM | POA: Diagnosis not present

## 2020-04-24 DIAGNOSIS — R601 Generalized edema: Secondary | ICD-10-CM | POA: Diagnosis not present

## 2020-04-24 DIAGNOSIS — M6281 Muscle weakness (generalized): Secondary | ICD-10-CM | POA: Diagnosis not present

## 2020-04-24 DIAGNOSIS — S22068D Other fracture of T7-T8 thoracic vertebra, subsequent encounter for fracture with routine healing: Secondary | ICD-10-CM | POA: Diagnosis not present

## 2020-04-24 DIAGNOSIS — Z9981 Dependence on supplemental oxygen: Secondary | ICD-10-CM | POA: Diagnosis not present

## 2020-04-25 ENCOUNTER — Other Ambulatory Visit: Payer: Self-pay | Admitting: *Deleted

## 2020-04-25 ENCOUNTER — Telehealth: Payer: Self-pay | Admitting: Cardiovascular Disease

## 2020-04-25 DIAGNOSIS — I5042 Chronic combined systolic (congestive) and diastolic (congestive) heart failure: Secondary | ICD-10-CM

## 2020-04-25 MED ORDER — TORSEMIDE 20 MG PO TABS: 20.0000 mg | ORAL_TABLET | Freq: Every day | ORAL | 3 refills | Status: DC

## 2020-04-25 NOTE — Telephone Encounter (Signed)
These have her take torsemide 40 mg today, then 20 mg daily thereafter. Can we please get the most recent note from her nephrologist? Check BMET and BNP tomorrow please.

## 2020-04-25 NOTE — Telephone Encounter (Signed)
Spoke with pt and pt  c/o SOB along with swelling the patient has had PT for a few weeks due to a fall ( suffered head injury and back injury per ct scan was okay ) about a month ago and therapist noted increase in SOB Per pt last Monday was seen by kidney Dr and diuretics were stopped and decreased Per pt is only taking Torsemide 20 mg every day  .Spironolactone and Metolazone were stopped Per pt prior to fall mid February pt's weight was 238 lbs and  last Friday was 250 and today was 247. Will forward to Dr [name] for review and recommendations .cy

## 2020-04-25 NOTE — Telephone Encounter (Signed)
The patient has been made aware to take the Torsemide 40 mg today and then 20 mg daily after that.   She is unable to come to the office to get lab work completed. She stated that she uses [facility].   Called [facility] and they will have the labs completed tomorrow and fax the results back. Lab orders have been faxed to [phone]

## 2020-04-25 NOTE — Telephone Encounter (Signed)
  Pt c/o Shortness Of Breath: STAT if SOB developed within the last 24 hours or pt is noticeably SOB on the phone  1. Are you currently SOB (can you hear that pt is SOB on the phone)? Not right now  2. How long have you been experiencing SOB? The last couple of days  3. Are you SOB when sitting or when up moving around? Moving around  4. Are you currently experiencing any other symptoms? She states she had a spell the other night that she felt tingly and weak. She thought it was coming from dehydration.

## 2020-04-26 DIAGNOSIS — S22068D Other fracture of T7-T8 thoracic vertebra, subsequent encounter for fracture with routine healing: Secondary | ICD-10-CM | POA: Diagnosis not present

## 2020-04-26 DIAGNOSIS — G8911 Acute pain due to trauma: Secondary | ICD-10-CM | POA: Diagnosis not present

## 2020-04-26 DIAGNOSIS — N183 Chronic kidney disease, stage 3 unspecified: Secondary | ICD-10-CM | POA: Diagnosis not present

## 2020-04-26 DIAGNOSIS — W19XXXD Unspecified fall, subsequent encounter: Secondary | ICD-10-CM | POA: Diagnosis not present

## 2020-04-26 DIAGNOSIS — M6281 Muscle weakness (generalized): Secondary | ICD-10-CM | POA: Diagnosis not present

## 2020-04-26 DIAGNOSIS — T148XXA Other injury of unspecified body region, initial encounter: Secondary | ICD-10-CM | POA: Diagnosis not present

## 2020-04-26 DIAGNOSIS — Z7409 Other reduced mobility: Secondary | ICD-10-CM | POA: Diagnosis not present

## 2020-04-26 DIAGNOSIS — Z9981 Dependence on supplemental oxygen: Secondary | ICD-10-CM | POA: Diagnosis not present

## 2020-04-26 DIAGNOSIS — S0083XD Contusion of other part of head, subsequent encounter: Secondary | ICD-10-CM | POA: Diagnosis not present

## 2020-04-26 DIAGNOSIS — R601 Generalized edema: Secondary | ICD-10-CM | POA: Diagnosis not present

## 2020-04-26 DIAGNOSIS — I5042 Chronic combined systolic (congestive) and diastolic (congestive) heart failure: Secondary | ICD-10-CM | POA: Diagnosis not present

## 2020-04-27 DIAGNOSIS — S0083XD Contusion of other part of head, subsequent encounter: Secondary | ICD-10-CM | POA: Diagnosis not present

## 2020-04-27 DIAGNOSIS — W19XXXD Unspecified fall, subsequent encounter: Secondary | ICD-10-CM | POA: Diagnosis not present

## 2020-04-27 DIAGNOSIS — N183 Chronic kidney disease, stage 3 unspecified: Secondary | ICD-10-CM | POA: Diagnosis not present

## 2020-04-27 DIAGNOSIS — G8911 Acute pain due to trauma: Secondary | ICD-10-CM | POA: Diagnosis not present

## 2020-04-27 DIAGNOSIS — Z9981 Dependence on supplemental oxygen: Secondary | ICD-10-CM | POA: Diagnosis not present

## 2020-04-27 DIAGNOSIS — M6281 Muscle weakness (generalized): Secondary | ICD-10-CM | POA: Diagnosis not present

## 2020-04-27 DIAGNOSIS — Z7409 Other reduced mobility: Secondary | ICD-10-CM | POA: Diagnosis not present

## 2020-04-27 DIAGNOSIS — S22068D Other fracture of T7-T8 thoracic vertebra, subsequent encounter for fracture with routine healing: Secondary | ICD-10-CM | POA: Diagnosis not present

## 2020-04-27 DIAGNOSIS — R601 Generalized edema: Secondary | ICD-10-CM | POA: Diagnosis not present

## 2020-04-30 ENCOUNTER — Encounter: Payer: Medicare Other | Admitting: Cardiovascular Disease

## 2020-04-30 DIAGNOSIS — R2689 Other abnormalities of gait and mobility: Secondary | ICD-10-CM | POA: Diagnosis not present

## 2020-05-01 DIAGNOSIS — N183 Chronic kidney disease, stage 3 unspecified: Secondary | ICD-10-CM | POA: Diagnosis not present

## 2020-05-01 DIAGNOSIS — S22068D Other fracture of T7-T8 thoracic vertebra, subsequent encounter for fracture with routine healing: Secondary | ICD-10-CM | POA: Diagnosis not present

## 2020-05-01 DIAGNOSIS — G8911 Acute pain due to trauma: Secondary | ICD-10-CM | POA: Diagnosis not present

## 2020-05-01 DIAGNOSIS — W19XXXD Unspecified fall, subsequent encounter: Secondary | ICD-10-CM | POA: Diagnosis not present

## 2020-05-01 DIAGNOSIS — Z7409 Other reduced mobility: Secondary | ICD-10-CM | POA: Diagnosis not present

## 2020-05-01 DIAGNOSIS — S0083XD Contusion of other part of head, subsequent encounter: Secondary | ICD-10-CM | POA: Diagnosis not present

## 2020-05-01 DIAGNOSIS — R601 Generalized edema: Secondary | ICD-10-CM | POA: Diagnosis not present

## 2020-05-01 DIAGNOSIS — Z9981 Dependence on supplemental oxygen: Secondary | ICD-10-CM | POA: Diagnosis not present

## 2020-05-01 DIAGNOSIS — M6281 Muscle weakness (generalized): Secondary | ICD-10-CM | POA: Diagnosis not present

## 2020-05-03 ENCOUNTER — Other Ambulatory Visit: Payer: Self-pay | Admitting: Cardiovascular Disease

## 2020-05-03 DIAGNOSIS — R601 Generalized edema: Secondary | ICD-10-CM | POA: Diagnosis not present

## 2020-05-03 DIAGNOSIS — G8911 Acute pain due to trauma: Secondary | ICD-10-CM | POA: Diagnosis not present

## 2020-05-03 DIAGNOSIS — Z7409 Other reduced mobility: Secondary | ICD-10-CM | POA: Diagnosis not present

## 2020-05-03 DIAGNOSIS — W19XXXD Unspecified fall, subsequent encounter: Secondary | ICD-10-CM | POA: Diagnosis not present

## 2020-05-03 DIAGNOSIS — S0083XD Contusion of other part of head, subsequent encounter: Secondary | ICD-10-CM | POA: Diagnosis not present

## 2020-05-03 DIAGNOSIS — N183 Chronic kidney disease, stage 3 unspecified: Secondary | ICD-10-CM | POA: Diagnosis not present

## 2020-05-03 DIAGNOSIS — Z9981 Dependence on supplemental oxygen: Secondary | ICD-10-CM | POA: Diagnosis not present

## 2020-05-03 DIAGNOSIS — S22068D Other fracture of T7-T8 thoracic vertebra, subsequent encounter for fracture with routine healing: Secondary | ICD-10-CM | POA: Diagnosis not present

## 2020-05-03 DIAGNOSIS — M6281 Muscle weakness (generalized): Secondary | ICD-10-CM | POA: Diagnosis not present

## 2020-05-04 DIAGNOSIS — Z9981 Dependence on supplemental oxygen: Secondary | ICD-10-CM | POA: Diagnosis not present

## 2020-05-04 DIAGNOSIS — N183 Chronic kidney disease, stage 3 unspecified: Secondary | ICD-10-CM | POA: Diagnosis not present

## 2020-05-04 DIAGNOSIS — M6281 Muscle weakness (generalized): Secondary | ICD-10-CM | POA: Diagnosis not present

## 2020-05-04 DIAGNOSIS — S22068D Other fracture of T7-T8 thoracic vertebra, subsequent encounter for fracture with routine healing: Secondary | ICD-10-CM | POA: Diagnosis not present

## 2020-05-04 DIAGNOSIS — Z7409 Other reduced mobility: Secondary | ICD-10-CM | POA: Diagnosis not present

## 2020-05-04 DIAGNOSIS — G8911 Acute pain due to trauma: Secondary | ICD-10-CM | POA: Diagnosis not present

## 2020-05-04 DIAGNOSIS — R601 Generalized edema: Secondary | ICD-10-CM | POA: Diagnosis not present

## 2020-05-04 DIAGNOSIS — S0083XD Contusion of other part of head, subsequent encounter: Secondary | ICD-10-CM | POA: Diagnosis not present

## 2020-05-04 DIAGNOSIS — W19XXXD Unspecified fall, subsequent encounter: Secondary | ICD-10-CM | POA: Diagnosis not present

## 2020-05-06 DIAGNOSIS — J9612 Chronic respiratory failure with hypercapnia: Secondary | ICD-10-CM | POA: Diagnosis not present

## 2020-05-06 DIAGNOSIS — J9611 Chronic respiratory failure with hypoxia: Secondary | ICD-10-CM | POA: Diagnosis not present

## 2020-05-06 DIAGNOSIS — I5022 Chronic systolic (congestive) heart failure: Secondary | ICD-10-CM | POA: Diagnosis not present

## 2020-05-07 DIAGNOSIS — N183 Chronic kidney disease, stage 3 unspecified: Secondary | ICD-10-CM | POA: Diagnosis not present

## 2020-05-07 DIAGNOSIS — G8911 Acute pain due to trauma: Secondary | ICD-10-CM | POA: Diagnosis not present

## 2020-05-07 DIAGNOSIS — M6281 Muscle weakness (generalized): Secondary | ICD-10-CM | POA: Diagnosis not present

## 2020-05-07 DIAGNOSIS — R601 Generalized edema: Secondary | ICD-10-CM | POA: Diagnosis not present

## 2020-05-07 DIAGNOSIS — W19XXXD Unspecified fall, subsequent encounter: Secondary | ICD-10-CM | POA: Diagnosis not present

## 2020-05-07 DIAGNOSIS — S22068D Other fracture of T7-T8 thoracic vertebra, subsequent encounter for fracture with routine healing: Secondary | ICD-10-CM | POA: Diagnosis not present

## 2020-05-07 DIAGNOSIS — S0083XD Contusion of other part of head, subsequent encounter: Secondary | ICD-10-CM | POA: Diagnosis not present

## 2020-05-07 DIAGNOSIS — Z7409 Other reduced mobility: Secondary | ICD-10-CM | POA: Diagnosis not present

## 2020-05-07 DIAGNOSIS — Z9981 Dependence on supplemental oxygen: Secondary | ICD-10-CM | POA: Diagnosis not present

## 2020-05-09 DIAGNOSIS — L89302 Pressure ulcer of unspecified buttock, stage 2: Secondary | ICD-10-CM | POA: Diagnosis not present

## 2020-05-09 DIAGNOSIS — M6281 Muscle weakness (generalized): Secondary | ICD-10-CM | POA: Diagnosis not present

## 2020-05-09 DIAGNOSIS — W19XXXD Unspecified fall, subsequent encounter: Secondary | ICD-10-CM | POA: Diagnosis not present

## 2020-05-09 DIAGNOSIS — S22068D Other fracture of T7-T8 thoracic vertebra, subsequent encounter for fracture with routine healing: Secondary | ICD-10-CM | POA: Diagnosis not present

## 2020-05-09 DIAGNOSIS — G8911 Acute pain due to trauma: Secondary | ICD-10-CM | POA: Diagnosis not present

## 2020-05-09 DIAGNOSIS — R601 Generalized edema: Secondary | ICD-10-CM | POA: Diagnosis not present

## 2020-05-09 DIAGNOSIS — N183 Chronic kidney disease, stage 3 unspecified: Secondary | ICD-10-CM | POA: Diagnosis not present

## 2020-05-09 DIAGNOSIS — S0083XD Contusion of other part of head, subsequent encounter: Secondary | ICD-10-CM | POA: Diagnosis not present

## 2020-05-09 DIAGNOSIS — Z9981 Dependence on supplemental oxygen: Secondary | ICD-10-CM | POA: Diagnosis not present

## 2020-05-09 DIAGNOSIS — Z7409 Other reduced mobility: Secondary | ICD-10-CM | POA: Diagnosis not present

## 2020-05-10 DIAGNOSIS — S22068D Other fracture of T7-T8 thoracic vertebra, subsequent encounter for fracture with routine healing: Secondary | ICD-10-CM | POA: Diagnosis not present

## 2020-05-10 DIAGNOSIS — Z7409 Other reduced mobility: Secondary | ICD-10-CM | POA: Diagnosis not present

## 2020-05-10 DIAGNOSIS — G8911 Acute pain due to trauma: Secondary | ICD-10-CM | POA: Diagnosis not present

## 2020-05-10 DIAGNOSIS — W19XXXD Unspecified fall, subsequent encounter: Secondary | ICD-10-CM | POA: Diagnosis not present

## 2020-05-10 DIAGNOSIS — M6281 Muscle weakness (generalized): Secondary | ICD-10-CM | POA: Diagnosis not present

## 2020-05-10 DIAGNOSIS — S0083XD Contusion of other part of head, subsequent encounter: Secondary | ICD-10-CM | POA: Diagnosis not present

## 2020-05-10 DIAGNOSIS — N183 Chronic kidney disease, stage 3 unspecified: Secondary | ICD-10-CM | POA: Diagnosis not present

## 2020-05-10 DIAGNOSIS — R601 Generalized edema: Secondary | ICD-10-CM | POA: Diagnosis not present

## 2020-05-10 DIAGNOSIS — Z9981 Dependence on supplemental oxygen: Secondary | ICD-10-CM | POA: Diagnosis not present

## 2020-05-11 DIAGNOSIS — W19XXXD Unspecified fall, subsequent encounter: Secondary | ICD-10-CM | POA: Diagnosis not present

## 2020-05-11 DIAGNOSIS — R601 Generalized edema: Secondary | ICD-10-CM | POA: Diagnosis not present

## 2020-05-11 DIAGNOSIS — Z7409 Other reduced mobility: Secondary | ICD-10-CM | POA: Diagnosis not present

## 2020-05-11 DIAGNOSIS — S22068D Other fracture of T7-T8 thoracic vertebra, subsequent encounter for fracture with routine healing: Secondary | ICD-10-CM | POA: Diagnosis not present

## 2020-05-11 DIAGNOSIS — M6281 Muscle weakness (generalized): Secondary | ICD-10-CM | POA: Diagnosis not present

## 2020-05-11 DIAGNOSIS — S0083XD Contusion of other part of head, subsequent encounter: Secondary | ICD-10-CM | POA: Diagnosis not present

## 2020-05-11 DIAGNOSIS — G8911 Acute pain due to trauma: Secondary | ICD-10-CM | POA: Diagnosis not present

## 2020-05-11 DIAGNOSIS — N183 Chronic kidney disease, stage 3 unspecified: Secondary | ICD-10-CM | POA: Diagnosis not present

## 2020-05-11 DIAGNOSIS — Z9981 Dependence on supplemental oxygen: Secondary | ICD-10-CM | POA: Diagnosis not present

## 2020-05-14 DIAGNOSIS — M6281 Muscle weakness (generalized): Secondary | ICD-10-CM | POA: Diagnosis not present

## 2020-05-14 DIAGNOSIS — Z9981 Dependence on supplemental oxygen: Secondary | ICD-10-CM | POA: Diagnosis not present

## 2020-05-14 DIAGNOSIS — W19XXXD Unspecified fall, subsequent encounter: Secondary | ICD-10-CM | POA: Diagnosis not present

## 2020-05-14 DIAGNOSIS — G8911 Acute pain due to trauma: Secondary | ICD-10-CM | POA: Diagnosis not present

## 2020-05-14 DIAGNOSIS — S0083XD Contusion of other part of head, subsequent encounter: Secondary | ICD-10-CM | POA: Diagnosis not present

## 2020-05-14 DIAGNOSIS — Z7409 Other reduced mobility: Secondary | ICD-10-CM | POA: Diagnosis not present

## 2020-05-14 DIAGNOSIS — S22068D Other fracture of T7-T8 thoracic vertebra, subsequent encounter for fracture with routine healing: Secondary | ICD-10-CM | POA: Diagnosis not present

## 2020-05-14 DIAGNOSIS — N183 Chronic kidney disease, stage 3 unspecified: Secondary | ICD-10-CM | POA: Diagnosis not present

## 2020-05-14 DIAGNOSIS — R601 Generalized edema: Secondary | ICD-10-CM | POA: Diagnosis not present

## 2020-05-15 DIAGNOSIS — G8911 Acute pain due to trauma: Secondary | ICD-10-CM | POA: Diagnosis not present

## 2020-05-15 DIAGNOSIS — S22068D Other fracture of T7-T8 thoracic vertebra, subsequent encounter for fracture with routine healing: Secondary | ICD-10-CM | POA: Diagnosis not present

## 2020-05-15 DIAGNOSIS — M6281 Muscle weakness (generalized): Secondary | ICD-10-CM | POA: Diagnosis not present

## 2020-05-15 DIAGNOSIS — Z9981 Dependence on supplemental oxygen: Secondary | ICD-10-CM | POA: Diagnosis not present

## 2020-05-15 DIAGNOSIS — R601 Generalized edema: Secondary | ICD-10-CM | POA: Diagnosis not present

## 2020-05-15 DIAGNOSIS — W19XXXD Unspecified fall, subsequent encounter: Secondary | ICD-10-CM | POA: Diagnosis not present

## 2020-05-15 DIAGNOSIS — S0083XD Contusion of other part of head, subsequent encounter: Secondary | ICD-10-CM | POA: Diagnosis not present

## 2020-05-15 DIAGNOSIS — N183 Chronic kidney disease, stage 3 unspecified: Secondary | ICD-10-CM | POA: Diagnosis not present

## 2020-05-15 DIAGNOSIS — Z7409 Other reduced mobility: Secondary | ICD-10-CM | POA: Diagnosis not present

## 2020-05-16 DIAGNOSIS — L89302 Pressure ulcer of unspecified buttock, stage 2: Secondary | ICD-10-CM | POA: Diagnosis not present

## 2020-05-17 DIAGNOSIS — R601 Generalized edema: Secondary | ICD-10-CM | POA: Diagnosis not present

## 2020-05-17 DIAGNOSIS — S22068D Other fracture of T7-T8 thoracic vertebra, subsequent encounter for fracture with routine healing: Secondary | ICD-10-CM | POA: Diagnosis not present

## 2020-05-17 DIAGNOSIS — Z7409 Other reduced mobility: Secondary | ICD-10-CM | POA: Diagnosis not present

## 2020-05-17 DIAGNOSIS — N183 Chronic kidney disease, stage 3 unspecified: Secondary | ICD-10-CM | POA: Diagnosis not present

## 2020-05-17 DIAGNOSIS — M6281 Muscle weakness (generalized): Secondary | ICD-10-CM | POA: Diagnosis not present

## 2020-05-17 DIAGNOSIS — Z9981 Dependence on supplemental oxygen: Secondary | ICD-10-CM | POA: Diagnosis not present

## 2020-05-17 DIAGNOSIS — S0083XD Contusion of other part of head, subsequent encounter: Secondary | ICD-10-CM | POA: Diagnosis not present

## 2020-05-17 DIAGNOSIS — G8911 Acute pain due to trauma: Secondary | ICD-10-CM | POA: Diagnosis not present

## 2020-05-17 DIAGNOSIS — W19XXXD Unspecified fall, subsequent encounter: Secondary | ICD-10-CM | POA: Diagnosis not present

## 2020-05-20 DIAGNOSIS — N183 Chronic kidney disease, stage 3 unspecified: Secondary | ICD-10-CM | POA: Diagnosis not present

## 2020-05-21 DIAGNOSIS — W19XXXD Unspecified fall, subsequent encounter: Secondary | ICD-10-CM | POA: Diagnosis not present

## 2020-05-21 DIAGNOSIS — R601 Generalized edema: Secondary | ICD-10-CM | POA: Diagnosis not present

## 2020-05-21 DIAGNOSIS — Z7409 Other reduced mobility: Secondary | ICD-10-CM | POA: Diagnosis not present

## 2020-05-21 DIAGNOSIS — N183 Chronic kidney disease, stage 3 unspecified: Secondary | ICD-10-CM | POA: Diagnosis not present

## 2020-05-21 DIAGNOSIS — S22068D Other fracture of T7-T8 thoracic vertebra, subsequent encounter for fracture with routine healing: Secondary | ICD-10-CM | POA: Diagnosis not present

## 2020-05-21 DIAGNOSIS — M6281 Muscle weakness (generalized): Secondary | ICD-10-CM | POA: Diagnosis not present

## 2020-05-21 DIAGNOSIS — G8911 Acute pain due to trauma: Secondary | ICD-10-CM | POA: Diagnosis not present

## 2020-05-21 DIAGNOSIS — S0083XD Contusion of other part of head, subsequent encounter: Secondary | ICD-10-CM | POA: Diagnosis not present

## 2020-05-21 DIAGNOSIS — Z9981 Dependence on supplemental oxygen: Secondary | ICD-10-CM | POA: Diagnosis not present

## 2020-05-23 DIAGNOSIS — G8911 Acute pain due to trauma: Secondary | ICD-10-CM | POA: Diagnosis not present

## 2020-05-23 DIAGNOSIS — W19XXXD Unspecified fall, subsequent encounter: Secondary | ICD-10-CM | POA: Diagnosis not present

## 2020-05-23 DIAGNOSIS — R601 Generalized edema: Secondary | ICD-10-CM | POA: Diagnosis not present

## 2020-05-23 DIAGNOSIS — S22068D Other fracture of T7-T8 thoracic vertebra, subsequent encounter for fracture with routine healing: Secondary | ICD-10-CM | POA: Diagnosis not present

## 2020-05-23 DIAGNOSIS — S0083XD Contusion of other part of head, subsequent encounter: Secondary | ICD-10-CM | POA: Diagnosis not present

## 2020-05-23 DIAGNOSIS — M6281 Muscle weakness (generalized): Secondary | ICD-10-CM | POA: Diagnosis not present

## 2020-05-23 DIAGNOSIS — Z9981 Dependence on supplemental oxygen: Secondary | ICD-10-CM | POA: Diagnosis not present

## 2020-05-23 DIAGNOSIS — N183 Chronic kidney disease, stage 3 unspecified: Secondary | ICD-10-CM | POA: Diagnosis not present

## 2020-05-23 DIAGNOSIS — Z7409 Other reduced mobility: Secondary | ICD-10-CM | POA: Diagnosis not present

## 2020-05-24 DIAGNOSIS — M6281 Muscle weakness (generalized): Secondary | ICD-10-CM | POA: Diagnosis not present

## 2020-05-24 DIAGNOSIS — Z7409 Other reduced mobility: Secondary | ICD-10-CM | POA: Diagnosis not present

## 2020-05-24 DIAGNOSIS — R601 Generalized edema: Secondary | ICD-10-CM | POA: Diagnosis not present

## 2020-05-24 DIAGNOSIS — S22068D Other fracture of T7-T8 thoracic vertebra, subsequent encounter for fracture with routine healing: Secondary | ICD-10-CM | POA: Diagnosis not present

## 2020-05-24 DIAGNOSIS — S0083XD Contusion of other part of head, subsequent encounter: Secondary | ICD-10-CM | POA: Diagnosis not present

## 2020-05-24 DIAGNOSIS — W19XXXD Unspecified fall, subsequent encounter: Secondary | ICD-10-CM | POA: Diagnosis not present

## 2020-05-24 DIAGNOSIS — Z9981 Dependence on supplemental oxygen: Secondary | ICD-10-CM | POA: Diagnosis not present

## 2020-05-24 DIAGNOSIS — G8911 Acute pain due to trauma: Secondary | ICD-10-CM | POA: Diagnosis not present

## 2020-05-24 DIAGNOSIS — N183 Chronic kidney disease, stage 3 unspecified: Secondary | ICD-10-CM | POA: Diagnosis not present

## 2020-05-25 DIAGNOSIS — W19XXXD Unspecified fall, subsequent encounter: Secondary | ICD-10-CM | POA: Diagnosis not present

## 2020-05-25 DIAGNOSIS — Z7409 Other reduced mobility: Secondary | ICD-10-CM | POA: Diagnosis not present

## 2020-05-25 DIAGNOSIS — N183 Chronic kidney disease, stage 3 unspecified: Secondary | ICD-10-CM | POA: Diagnosis not present

## 2020-05-25 DIAGNOSIS — Z9981 Dependence on supplemental oxygen: Secondary | ICD-10-CM | POA: Diagnosis not present

## 2020-05-25 DIAGNOSIS — S0083XD Contusion of other part of head, subsequent encounter: Secondary | ICD-10-CM | POA: Diagnosis not present

## 2020-05-25 DIAGNOSIS — S22068D Other fracture of T7-T8 thoracic vertebra, subsequent encounter for fracture with routine healing: Secondary | ICD-10-CM | POA: Diagnosis not present

## 2020-05-25 DIAGNOSIS — G8911 Acute pain due to trauma: Secondary | ICD-10-CM | POA: Diagnosis not present

## 2020-05-25 DIAGNOSIS — R601 Generalized edema: Secondary | ICD-10-CM | POA: Diagnosis not present

## 2020-05-25 DIAGNOSIS — M6281 Muscle weakness (generalized): Secondary | ICD-10-CM | POA: Diagnosis not present

## 2020-05-26 IMAGING — DX DG CHEST 2V
2 series · 2 of 2 positions shown · non-contrast
Comparison: 03/14/2019

CLINICAL DATA: Chest pain

EXAM:
CHEST - 2 VIEW

[chest lat]
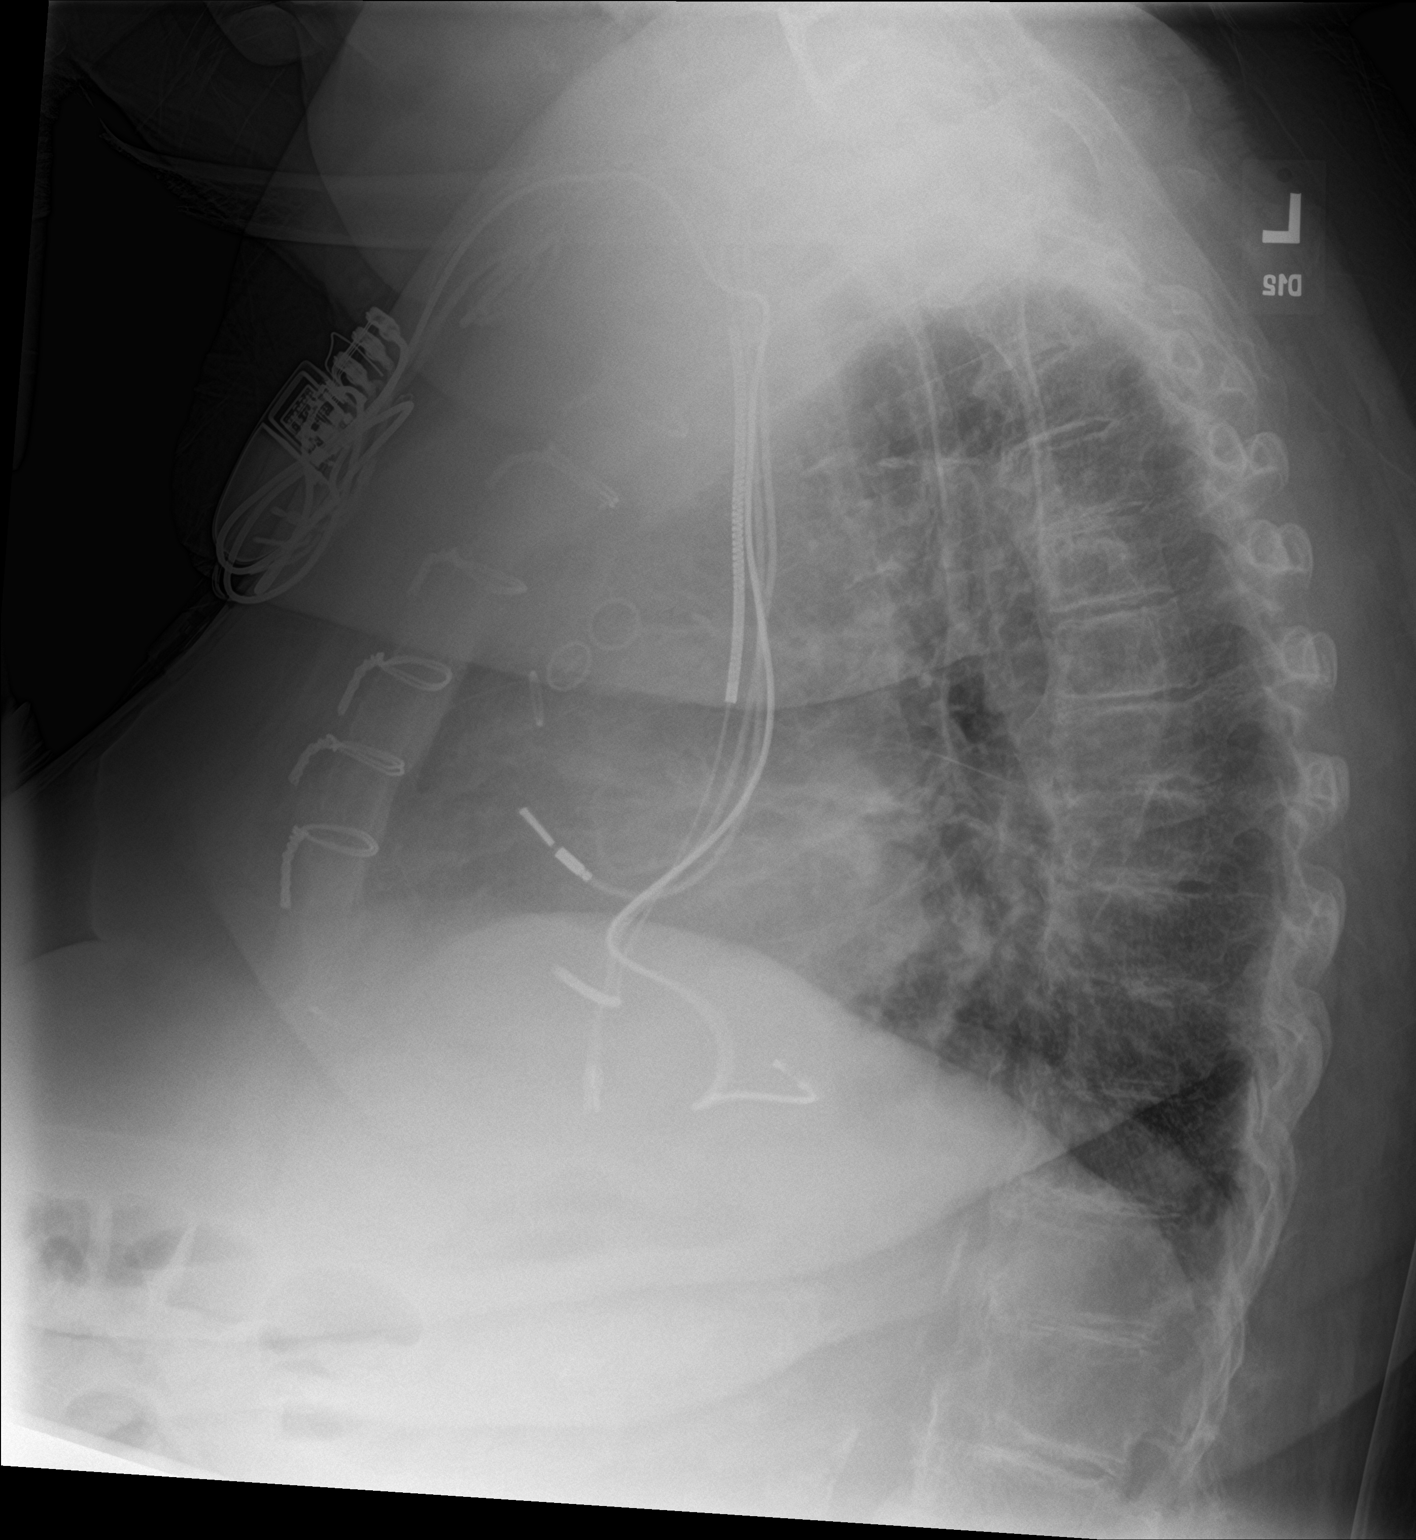

[chest ap]
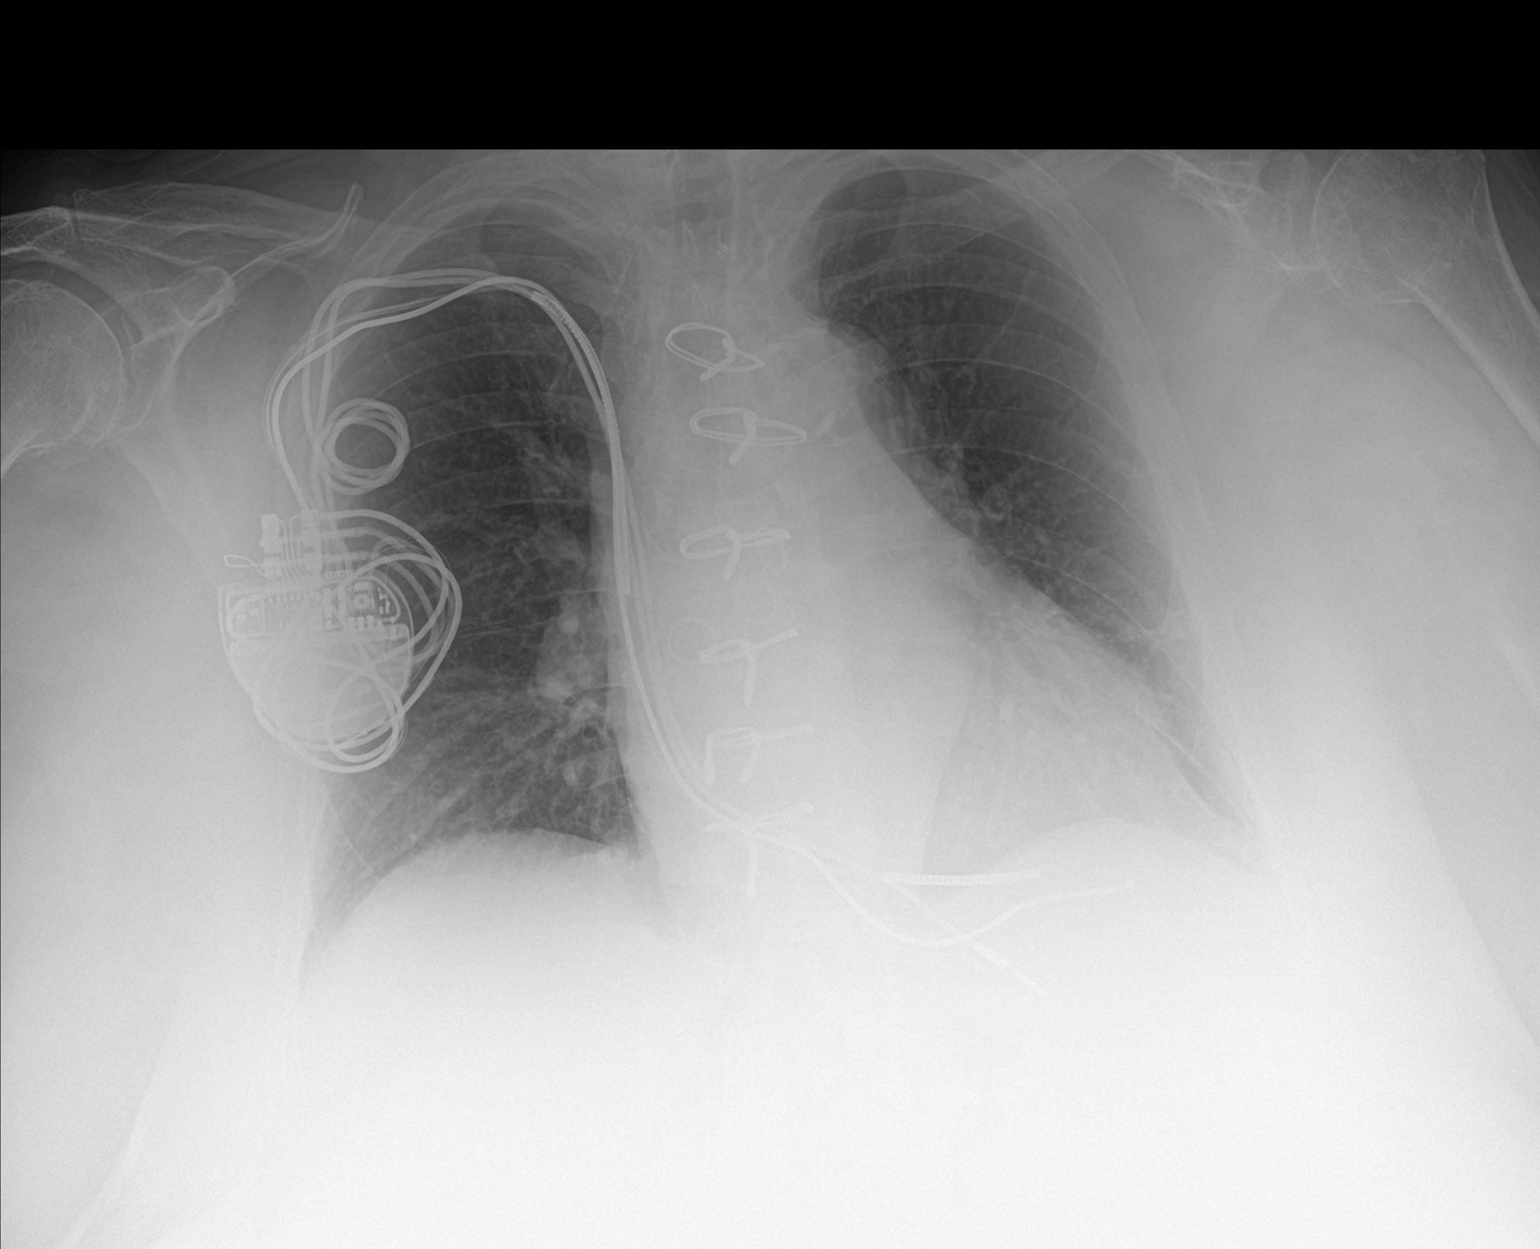

[2 of 2 positions shown; findings below may reference images not displayed]

FINDINGS: There is no focal consolidation. There is no pleural effusion or
pneumothorax. The heart and mediastinal contours are unremarkable.
There is evidence of prior CABG. There is a cardiac pacemaker
present.

There is no acute osseous abnormality.
IMPRESSION: No active cardiopulmonary disease.

## 2020-05-27 IMAGING — NM NM PULMONARY PERF PARTICULATE
8 series · 8 of 8 positions shown · non-contrast
Comparison: Radiograph 05/31/2019

CLINICAL DATA: Congestive heart failure, short of breath. Chronic
hypertension. Morbid PC.

EXAM:
NUCLEAR MEDICINE PERFUSION LUNG SCAN
TECHNIQUE: Perfusion images were obtained in multiple projections after
intravenous injection of radiopharmaceutical.
RADIOPHARMACEUTICALS:  1.6 mCi Fc-EEm MAA

[Series 1: ant/post perf · 4.14mm/px · 1 of 1 slices shown (1 of 2)]
[im 1/1]
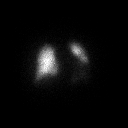

[Series 1: ant/post perf · 4.14mm/px · 1 of 1 slices shown (2 of 2)]
[im 1/1]
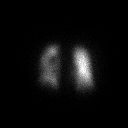

[Series 2: lao/rpo perf · 4.14mm/px · 1 of 1 slices shown (1 of 2)]
[im 1/1]
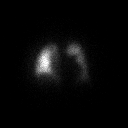

[Series 2: lao/rpo perf · 4.14mm/px · 1 of 1 slices shown (2 of 2)]
[im 1/1]
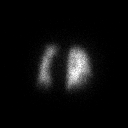

[Series 3: lpo/rao perf · 4.14mm/px · 1 of 1 slices shown (1 of 2)]
[im 1/1]
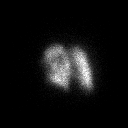

[Series 3: lpo/rao perf · 4.14mm/px · 1 of 1 slices shown (2 of 2)]
[im 1/1]
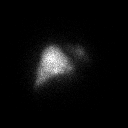

[Series 4: lt lat/rt lat perf · 4.14mm/px · 1 of 1 slices shown (1 of 2)]
[im 1/1]
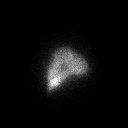

[Series 4: lt lat/rt lat perf · 4.14mm/px · 1 of 1 slices shown (2 of 2)]
[im 1/1  full-range]
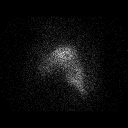

[8 of 8 positions shown; findings below may reference images not displayed]

FINDINGS: Photopenic defect in the LEFT upper lobe relates to pacemaker on
comparison radiograph. There are no wedge-shaped peripheral
perfusion defects within LEFT or RIGHT lung to suggest acute
pulmonary embolism.
IMPRESSION: No evidence acute or chronic pulmonary embolism.

## 2020-05-27 IMAGING — DX DG SHOULDER 2+V*L*
3 series · 3 of 3 positions shown · non-contrast
Comparison: None.

CLINICAL DATA: Left shoulder pain

EXAM:
LEFT SHOULDER - 2+ VIEW

[shoulder grashey]
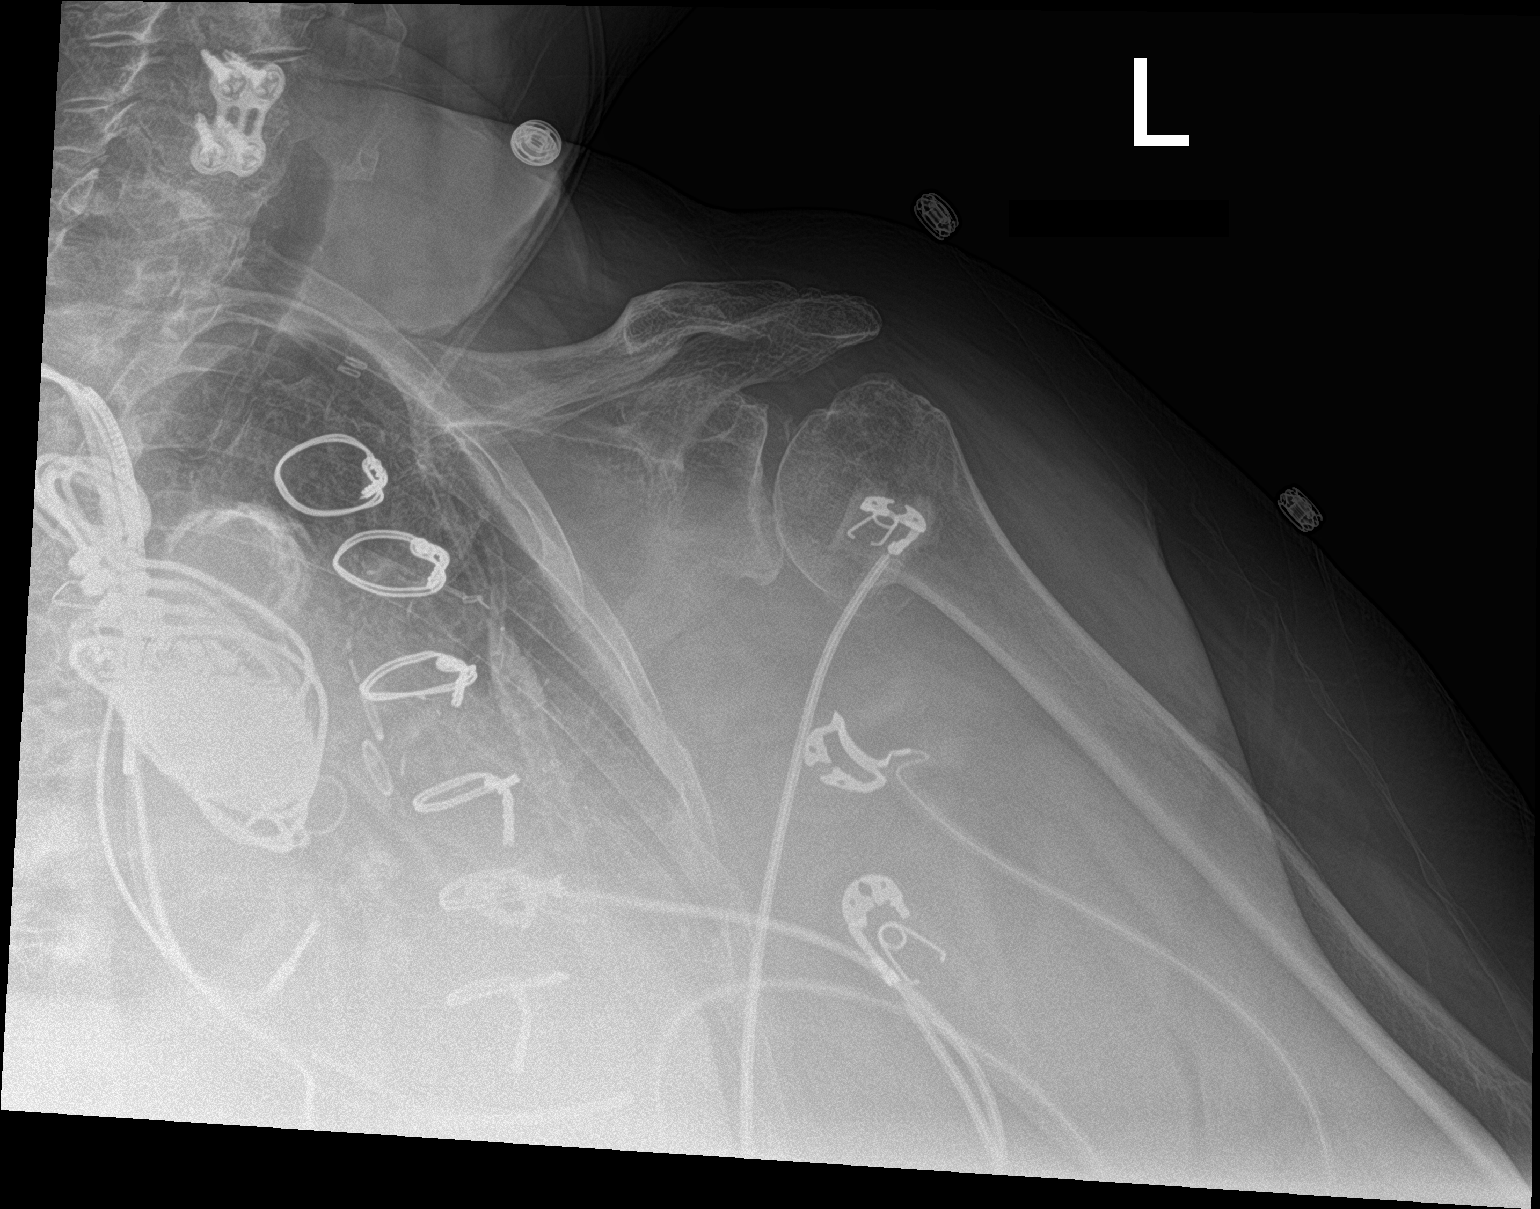

[shoulder y view]
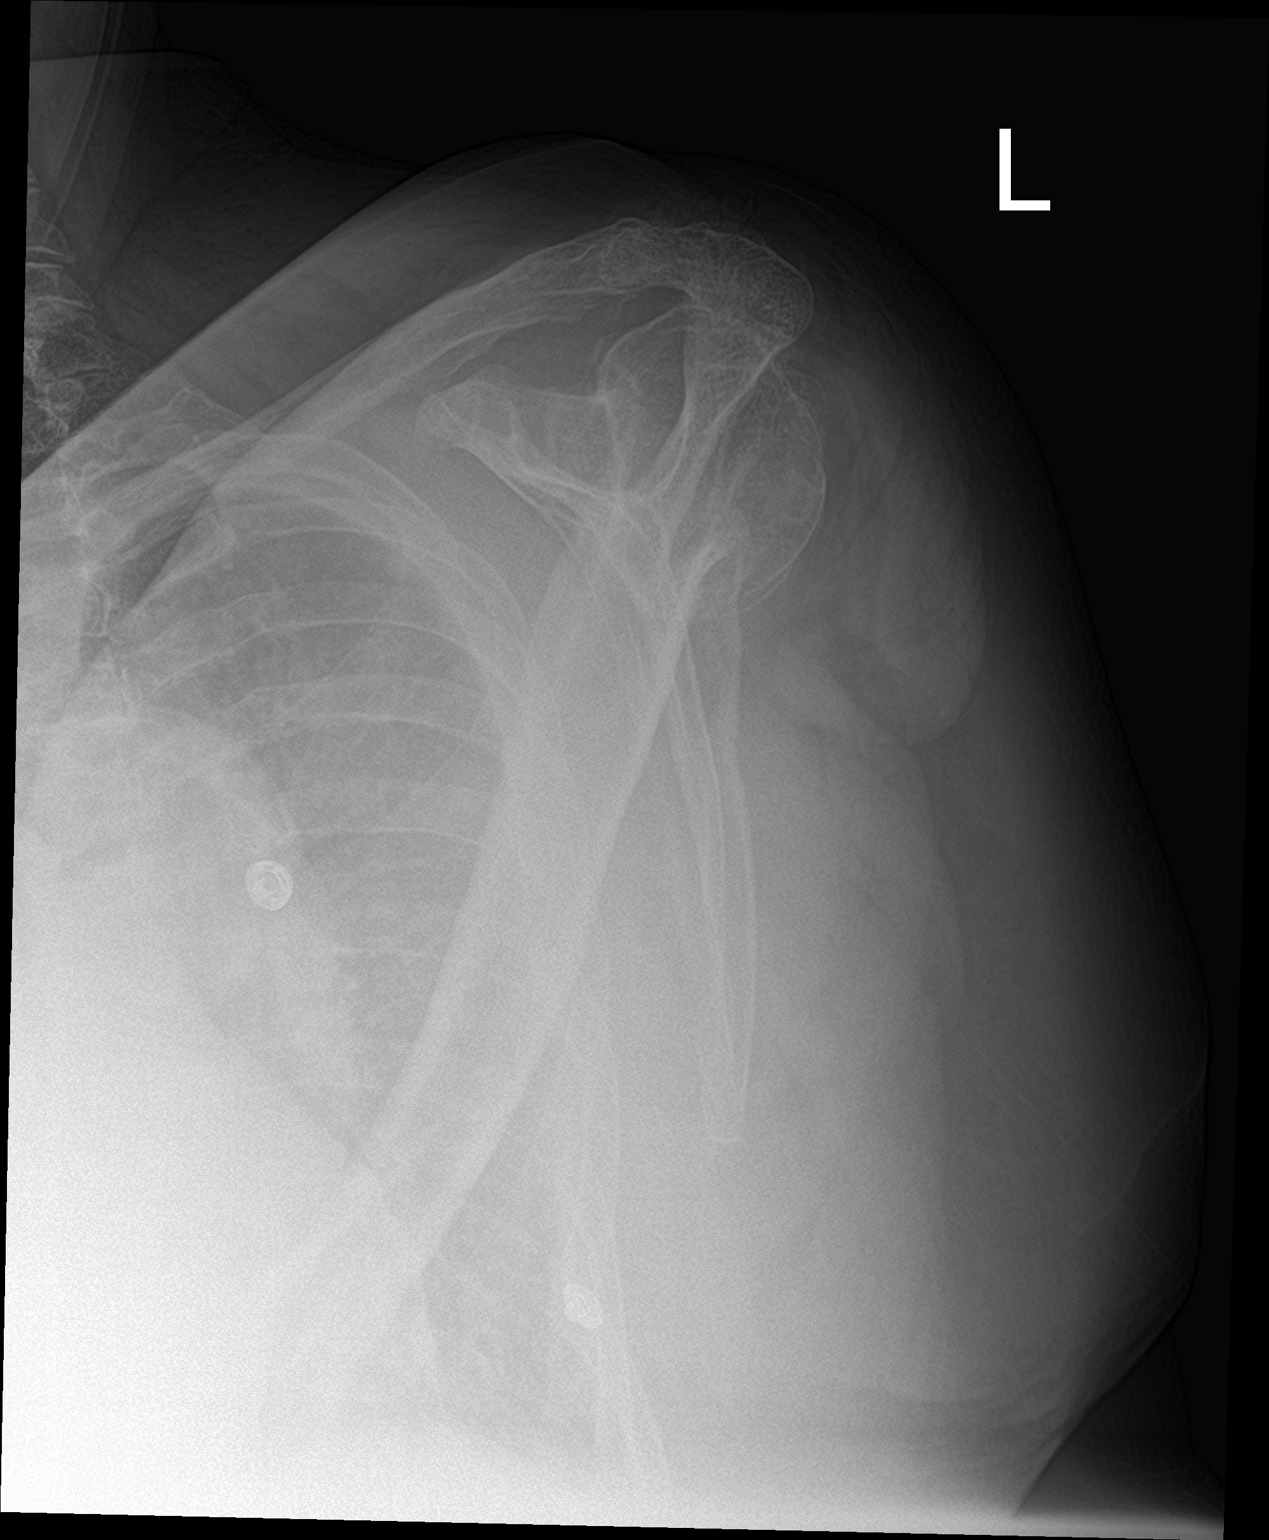

[shoulder ap neutral]
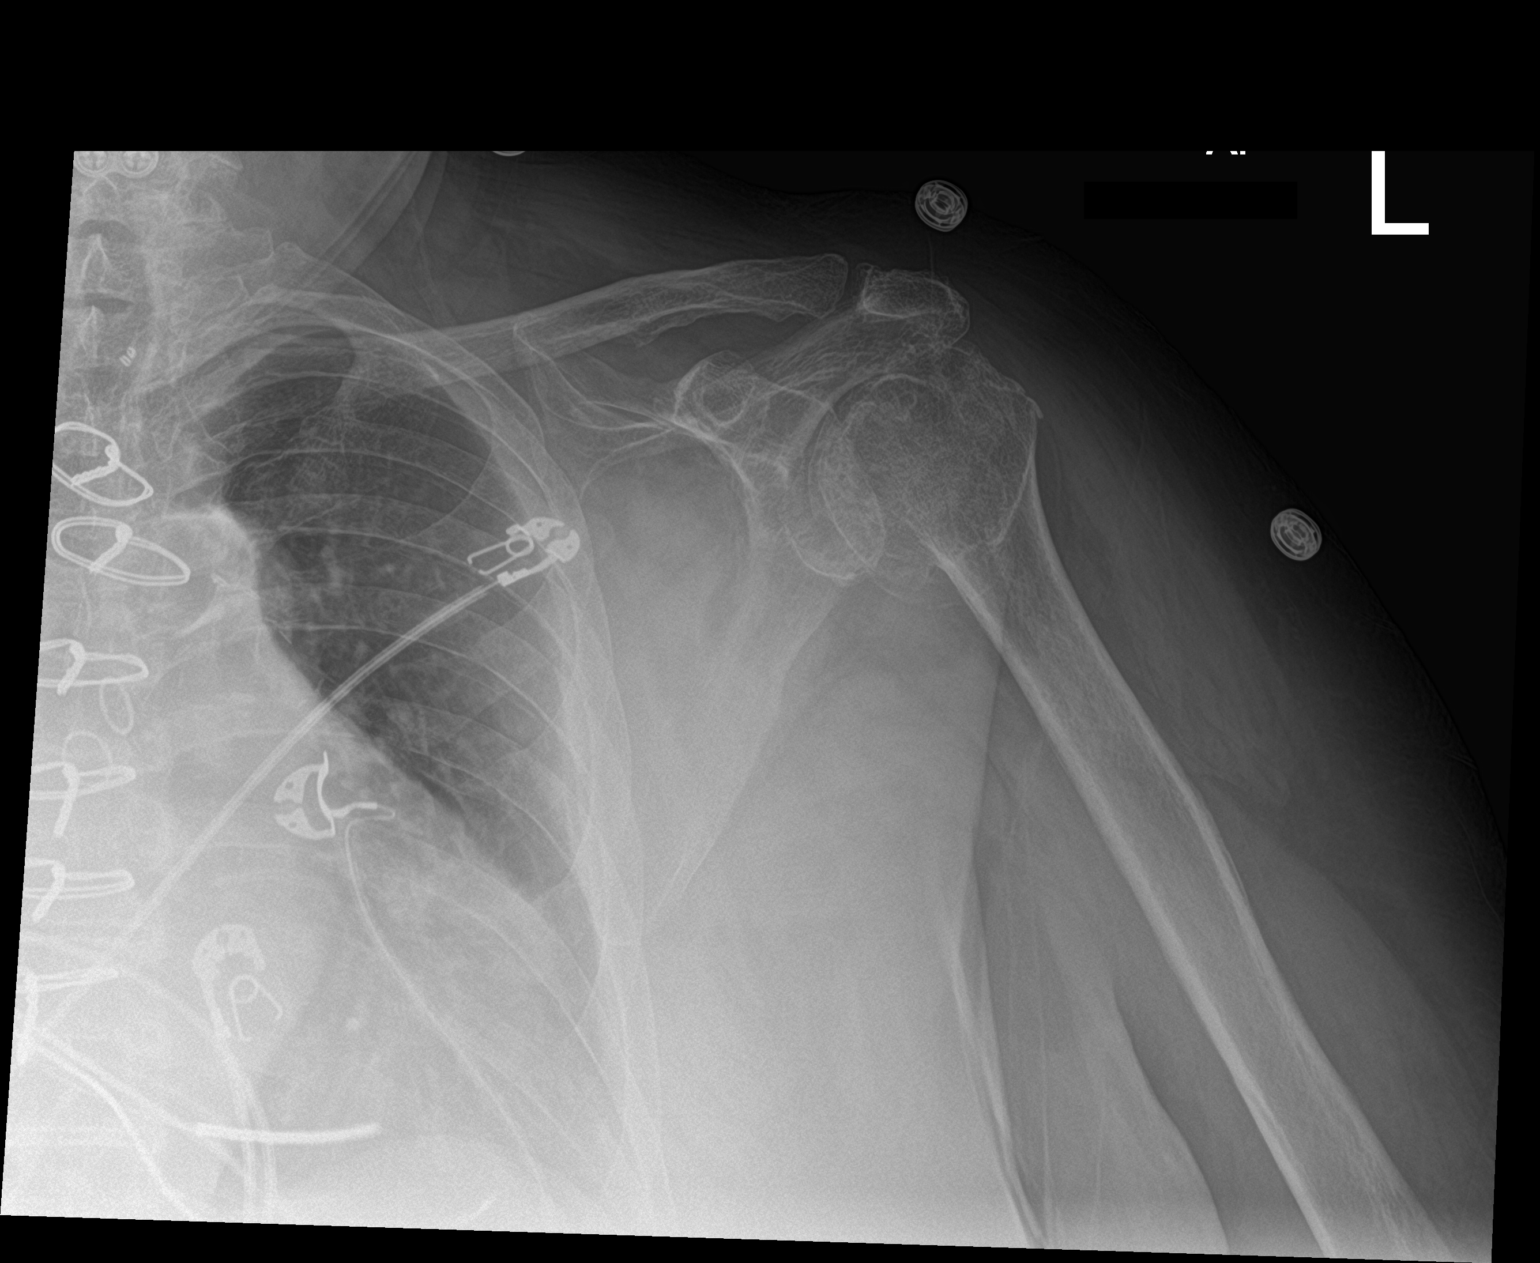

[3 of 3 positions shown; findings below may reference images not displayed]

FINDINGS: Moderate degenerative changes in the left AC and glenohumeral
joints. A shin no visible fracture. No subluxation or dislocation.
IMPRESSION: Moderate degenerative changes.  No acute bony abnormality.

## 2020-05-29 DIAGNOSIS — N2581 Secondary hyperparathyroidism of renal origin: Secondary | ICD-10-CM | POA: Diagnosis not present

## 2020-05-29 DIAGNOSIS — Z95810 Presence of automatic (implantable) cardiac defibrillator: Secondary | ICD-10-CM | POA: Diagnosis not present

## 2020-05-29 DIAGNOSIS — N179 Acute kidney failure, unspecified: Secondary | ICD-10-CM | POA: Diagnosis not present

## 2020-05-29 DIAGNOSIS — N184 Chronic kidney disease, stage 4 (severe): Secondary | ICD-10-CM | POA: Diagnosis not present

## 2020-05-29 DIAGNOSIS — I509 Heart failure, unspecified: Secondary | ICD-10-CM | POA: Diagnosis not present

## 2020-05-29 DIAGNOSIS — I129 Hypertensive chronic kidney disease with stage 1 through stage 4 chronic kidney disease, or unspecified chronic kidney disease: Secondary | ICD-10-CM | POA: Diagnosis not present

## 2020-05-29 DIAGNOSIS — M1000 Idiopathic gout, unspecified site: Secondary | ICD-10-CM | POA: Diagnosis not present

## 2020-05-29 DIAGNOSIS — I2510 Atherosclerotic heart disease of native coronary artery without angina pectoris: Secondary | ICD-10-CM | POA: Diagnosis not present

## 2020-05-29 DIAGNOSIS — Z7689 Persons encountering health services in other specified circumstances: Secondary | ICD-10-CM | POA: Diagnosis not present

## 2020-05-29 DIAGNOSIS — E1122 Type 2 diabetes mellitus with diabetic chronic kidney disease: Secondary | ICD-10-CM | POA: Diagnosis not present

## 2020-05-30 DIAGNOSIS — Z7409 Other reduced mobility: Secondary | ICD-10-CM | POA: Diagnosis not present

## 2020-05-30 DIAGNOSIS — Z9981 Dependence on supplemental oxygen: Secondary | ICD-10-CM | POA: Diagnosis not present

## 2020-05-30 DIAGNOSIS — W19XXXD Unspecified fall, subsequent encounter: Secondary | ICD-10-CM | POA: Diagnosis not present

## 2020-05-30 DIAGNOSIS — S22068D Other fracture of T7-T8 thoracic vertebra, subsequent encounter for fracture with routine healing: Secondary | ICD-10-CM | POA: Diagnosis not present

## 2020-05-30 DIAGNOSIS — M6281 Muscle weakness (generalized): Secondary | ICD-10-CM | POA: Diagnosis not present

## 2020-05-30 DIAGNOSIS — R601 Generalized edema: Secondary | ICD-10-CM | POA: Diagnosis not present

## 2020-05-30 DIAGNOSIS — N183 Chronic kidney disease, stage 3 unspecified: Secondary | ICD-10-CM | POA: Diagnosis not present

## 2020-05-30 DIAGNOSIS — S0083XD Contusion of other part of head, subsequent encounter: Secondary | ICD-10-CM | POA: Diagnosis not present

## 2020-05-30 DIAGNOSIS — G8911 Acute pain due to trauma: Secondary | ICD-10-CM | POA: Diagnosis not present

## 2020-05-31 DIAGNOSIS — M6281 Muscle weakness (generalized): Secondary | ICD-10-CM | POA: Diagnosis not present

## 2020-05-31 DIAGNOSIS — Z7409 Other reduced mobility: Secondary | ICD-10-CM | POA: Diagnosis not present

## 2020-05-31 DIAGNOSIS — S0083XD Contusion of other part of head, subsequent encounter: Secondary | ICD-10-CM | POA: Diagnosis not present

## 2020-05-31 DIAGNOSIS — R2689 Other abnormalities of gait and mobility: Secondary | ICD-10-CM | POA: Diagnosis not present

## 2020-05-31 DIAGNOSIS — W19XXXD Unspecified fall, subsequent encounter: Secondary | ICD-10-CM | POA: Diagnosis not present

## 2020-05-31 DIAGNOSIS — R601 Generalized edema: Secondary | ICD-10-CM | POA: Diagnosis not present

## 2020-05-31 DIAGNOSIS — S22068D Other fracture of T7-T8 thoracic vertebra, subsequent encounter for fracture with routine healing: Secondary | ICD-10-CM | POA: Diagnosis not present

## 2020-05-31 DIAGNOSIS — Z9981 Dependence on supplemental oxygen: Secondary | ICD-10-CM | POA: Diagnosis not present

## 2020-05-31 DIAGNOSIS — G8911 Acute pain due to trauma: Secondary | ICD-10-CM | POA: Diagnosis not present

## 2020-05-31 DIAGNOSIS — N183 Chronic kidney disease, stage 3 unspecified: Secondary | ICD-10-CM | POA: Diagnosis not present

## 2020-06-04 DIAGNOSIS — S0083XD Contusion of other part of head, subsequent encounter: Secondary | ICD-10-CM | POA: Diagnosis not present

## 2020-06-04 DIAGNOSIS — Z7409 Other reduced mobility: Secondary | ICD-10-CM | POA: Diagnosis not present

## 2020-06-04 DIAGNOSIS — Z9981 Dependence on supplemental oxygen: Secondary | ICD-10-CM | POA: Diagnosis not present

## 2020-06-04 DIAGNOSIS — N183 Chronic kidney disease, stage 3 unspecified: Secondary | ICD-10-CM | POA: Diagnosis not present

## 2020-06-04 DIAGNOSIS — G8911 Acute pain due to trauma: Secondary | ICD-10-CM | POA: Diagnosis not present

## 2020-06-04 DIAGNOSIS — W19XXXD Unspecified fall, subsequent encounter: Secondary | ICD-10-CM | POA: Diagnosis not present

## 2020-06-04 DIAGNOSIS — S22068D Other fracture of T7-T8 thoracic vertebra, subsequent encounter for fracture with routine healing: Secondary | ICD-10-CM | POA: Diagnosis not present

## 2020-06-04 DIAGNOSIS — R601 Generalized edema: Secondary | ICD-10-CM | POA: Diagnosis not present

## 2020-06-04 DIAGNOSIS — M6281 Muscle weakness (generalized): Secondary | ICD-10-CM | POA: Diagnosis not present

## 2020-06-06 DIAGNOSIS — I5022 Chronic systolic (congestive) heart failure: Secondary | ICD-10-CM | POA: Diagnosis not present

## 2020-06-06 DIAGNOSIS — J9611 Chronic respiratory failure with hypoxia: Secondary | ICD-10-CM | POA: Diagnosis not present

## 2020-06-06 DIAGNOSIS — J9612 Chronic respiratory failure with hypercapnia: Secondary | ICD-10-CM | POA: Diagnosis not present

## 2020-06-06 DIAGNOSIS — E1122 Type 2 diabetes mellitus with diabetic chronic kidney disease: Secondary | ICD-10-CM | POA: Diagnosis not present

## 2020-06-06 DIAGNOSIS — N2581 Secondary hyperparathyroidism of renal origin: Secondary | ICD-10-CM | POA: Diagnosis not present

## 2020-06-06 DIAGNOSIS — M1000 Idiopathic gout, unspecified site: Secondary | ICD-10-CM | POA: Diagnosis not present

## 2020-06-06 DIAGNOSIS — I129 Hypertensive chronic kidney disease with stage 1 through stage 4 chronic kidney disease, or unspecified chronic kidney disease: Secondary | ICD-10-CM | POA: Diagnosis not present

## 2020-06-06 DIAGNOSIS — N184 Chronic kidney disease, stage 4 (severe): Secondary | ICD-10-CM | POA: Diagnosis not present

## 2020-06-07 DIAGNOSIS — S0083XD Contusion of other part of head, subsequent encounter: Secondary | ICD-10-CM | POA: Diagnosis not present

## 2020-06-07 DIAGNOSIS — N183 Chronic kidney disease, stage 3 unspecified: Secondary | ICD-10-CM | POA: Diagnosis not present

## 2020-06-07 DIAGNOSIS — S22068D Other fracture of T7-T8 thoracic vertebra, subsequent encounter for fracture with routine healing: Secondary | ICD-10-CM | POA: Diagnosis not present

## 2020-06-07 DIAGNOSIS — M6281 Muscle weakness (generalized): Secondary | ICD-10-CM | POA: Diagnosis not present

## 2020-06-07 DIAGNOSIS — Z7409 Other reduced mobility: Secondary | ICD-10-CM | POA: Diagnosis not present

## 2020-06-07 DIAGNOSIS — R601 Generalized edema: Secondary | ICD-10-CM | POA: Diagnosis not present

## 2020-06-07 DIAGNOSIS — Z9981 Dependence on supplemental oxygen: Secondary | ICD-10-CM | POA: Diagnosis not present

## 2020-06-07 DIAGNOSIS — W19XXXD Unspecified fall, subsequent encounter: Secondary | ICD-10-CM | POA: Diagnosis not present

## 2020-06-07 DIAGNOSIS — G8911 Acute pain due to trauma: Secondary | ICD-10-CM | POA: Diagnosis not present

## 2020-06-12 DIAGNOSIS — Z7409 Other reduced mobility: Secondary | ICD-10-CM | POA: Diagnosis not present

## 2020-06-12 DIAGNOSIS — S22068D Other fracture of T7-T8 thoracic vertebra, subsequent encounter for fracture with routine healing: Secondary | ICD-10-CM | POA: Diagnosis not present

## 2020-06-12 DIAGNOSIS — Z9981 Dependence on supplemental oxygen: Secondary | ICD-10-CM | POA: Diagnosis not present

## 2020-06-12 DIAGNOSIS — N183 Chronic kidney disease, stage 3 unspecified: Secondary | ICD-10-CM | POA: Diagnosis not present

## 2020-06-12 DIAGNOSIS — Z7984 Long term (current) use of oral hypoglycemic drugs: Secondary | ICD-10-CM | POA: Diagnosis not present

## 2020-06-12 DIAGNOSIS — R601 Generalized edema: Secondary | ICD-10-CM | POA: Diagnosis not present

## 2020-06-12 DIAGNOSIS — M6281 Muscle weakness (generalized): Secondary | ICD-10-CM | POA: Diagnosis not present

## 2020-06-12 DIAGNOSIS — E119 Type 2 diabetes mellitus without complications: Secondary | ICD-10-CM | POA: Diagnosis not present

## 2020-06-12 DIAGNOSIS — S22058D Other fracture of T5-T6 vertebra, subsequent encounter for fracture with routine healing: Secondary | ICD-10-CM | POA: Diagnosis not present

## 2020-06-14 DIAGNOSIS — Z7984 Long term (current) use of oral hypoglycemic drugs: Secondary | ICD-10-CM | POA: Diagnosis not present

## 2020-06-14 DIAGNOSIS — R601 Generalized edema: Secondary | ICD-10-CM | POA: Diagnosis not present

## 2020-06-14 DIAGNOSIS — S22058D Other fracture of T5-T6 vertebra, subsequent encounter for fracture with routine healing: Secondary | ICD-10-CM | POA: Diagnosis not present

## 2020-06-14 DIAGNOSIS — Z9981 Dependence on supplemental oxygen: Secondary | ICD-10-CM | POA: Diagnosis not present

## 2020-06-14 DIAGNOSIS — M6281 Muscle weakness (generalized): Secondary | ICD-10-CM | POA: Diagnosis not present

## 2020-06-14 DIAGNOSIS — N183 Chronic kidney disease, stage 3 unspecified: Secondary | ICD-10-CM | POA: Diagnosis not present

## 2020-06-14 DIAGNOSIS — Z7409 Other reduced mobility: Secondary | ICD-10-CM | POA: Diagnosis not present

## 2020-06-14 DIAGNOSIS — S22068D Other fracture of T7-T8 thoracic vertebra, subsequent encounter for fracture with routine healing: Secondary | ICD-10-CM | POA: Diagnosis not present

## 2020-06-14 DIAGNOSIS — E119 Type 2 diabetes mellitus without complications: Secondary | ICD-10-CM | POA: Diagnosis not present

## 2020-06-19 DIAGNOSIS — E119 Type 2 diabetes mellitus without complications: Secondary | ICD-10-CM | POA: Diagnosis not present

## 2020-06-19 DIAGNOSIS — Z9981 Dependence on supplemental oxygen: Secondary | ICD-10-CM | POA: Diagnosis not present

## 2020-06-19 DIAGNOSIS — Z7984 Long term (current) use of oral hypoglycemic drugs: Secondary | ICD-10-CM | POA: Diagnosis not present

## 2020-06-19 DIAGNOSIS — R601 Generalized edema: Secondary | ICD-10-CM | POA: Diagnosis not present

## 2020-06-19 DIAGNOSIS — S22058D Other fracture of T5-T6 vertebra, subsequent encounter for fracture with routine healing: Secondary | ICD-10-CM | POA: Diagnosis not present

## 2020-06-19 DIAGNOSIS — Z7409 Other reduced mobility: Secondary | ICD-10-CM | POA: Diagnosis not present

## 2020-06-19 DIAGNOSIS — M6281 Muscle weakness (generalized): Secondary | ICD-10-CM | POA: Diagnosis not present

## 2020-06-19 DIAGNOSIS — N183 Chronic kidney disease, stage 3 unspecified: Secondary | ICD-10-CM | POA: Diagnosis not present

## 2020-06-19 DIAGNOSIS — S22068D Other fracture of T7-T8 thoracic vertebra, subsequent encounter for fracture with routine healing: Secondary | ICD-10-CM | POA: Diagnosis not present

## 2020-06-21 DIAGNOSIS — Z9981 Dependence on supplemental oxygen: Secondary | ICD-10-CM | POA: Diagnosis not present

## 2020-06-21 DIAGNOSIS — E119 Type 2 diabetes mellitus without complications: Secondary | ICD-10-CM | POA: Diagnosis not present

## 2020-06-21 DIAGNOSIS — N183 Chronic kidney disease, stage 3 unspecified: Secondary | ICD-10-CM | POA: Diagnosis not present

## 2020-06-21 DIAGNOSIS — Z7984 Long term (current) use of oral hypoglycemic drugs: Secondary | ICD-10-CM | POA: Diagnosis not present

## 2020-06-21 DIAGNOSIS — R601 Generalized edema: Secondary | ICD-10-CM | POA: Diagnosis not present

## 2020-06-21 DIAGNOSIS — S22058D Other fracture of T5-T6 vertebra, subsequent encounter for fracture with routine healing: Secondary | ICD-10-CM | POA: Diagnosis not present

## 2020-06-21 DIAGNOSIS — Z7409 Other reduced mobility: Secondary | ICD-10-CM | POA: Diagnosis not present

## 2020-06-21 DIAGNOSIS — S22068D Other fracture of T7-T8 thoracic vertebra, subsequent encounter for fracture with routine healing: Secondary | ICD-10-CM | POA: Diagnosis not present

## 2020-06-21 DIAGNOSIS — M6281 Muscle weakness (generalized): Secondary | ICD-10-CM | POA: Diagnosis not present

## 2020-06-25 DIAGNOSIS — I509 Heart failure, unspecified: Secondary | ICD-10-CM | POA: Diagnosis not present

## 2020-06-25 DIAGNOSIS — I129 Hypertensive chronic kidney disease with stage 1 through stage 4 chronic kidney disease, or unspecified chronic kidney disease: Secondary | ICD-10-CM | POA: Diagnosis not present

## 2020-06-25 DIAGNOSIS — N184 Chronic kidney disease, stage 4 (severe): Secondary | ICD-10-CM | POA: Diagnosis not present

## 2020-06-26 DIAGNOSIS — Z7984 Long term (current) use of oral hypoglycemic drugs: Secondary | ICD-10-CM | POA: Diagnosis not present

## 2020-06-26 DIAGNOSIS — S22058D Other fracture of T5-T6 vertebra, subsequent encounter for fracture with routine healing: Secondary | ICD-10-CM | POA: Diagnosis not present

## 2020-06-26 DIAGNOSIS — E119 Type 2 diabetes mellitus without complications: Secondary | ICD-10-CM | POA: Diagnosis not present

## 2020-06-26 DIAGNOSIS — R601 Generalized edema: Secondary | ICD-10-CM | POA: Diagnosis not present

## 2020-06-26 DIAGNOSIS — Z9981 Dependence on supplemental oxygen: Secondary | ICD-10-CM | POA: Diagnosis not present

## 2020-06-26 DIAGNOSIS — M6281 Muscle weakness (generalized): Secondary | ICD-10-CM | POA: Diagnosis not present

## 2020-06-26 DIAGNOSIS — Z7409 Other reduced mobility: Secondary | ICD-10-CM | POA: Diagnosis not present

## 2020-06-26 DIAGNOSIS — N183 Chronic kidney disease, stage 3 unspecified: Secondary | ICD-10-CM | POA: Diagnosis not present

## 2020-06-26 DIAGNOSIS — I10 Essential (primary) hypertension: Secondary | ICD-10-CM | POA: Diagnosis not present

## 2020-06-26 DIAGNOSIS — S22058A Other fracture of T5-T6 vertebra, initial encounter for closed fracture: Secondary | ICD-10-CM | POA: Diagnosis not present

## 2020-06-26 DIAGNOSIS — S22068D Other fracture of T7-T8 thoracic vertebra, subsequent encounter for fracture with routine healing: Secondary | ICD-10-CM | POA: Diagnosis not present

## 2020-06-28 ENCOUNTER — Other Ambulatory Visit: Payer: Self-pay | Admitting: Cardiovascular Disease

## 2020-06-28 DIAGNOSIS — S22058D Other fracture of T5-T6 vertebra, subsequent encounter for fracture with routine healing: Secondary | ICD-10-CM | POA: Diagnosis not present

## 2020-06-28 DIAGNOSIS — N183 Chronic kidney disease, stage 3 unspecified: Secondary | ICD-10-CM | POA: Diagnosis not present

## 2020-06-28 DIAGNOSIS — Z9981 Dependence on supplemental oxygen: Secondary | ICD-10-CM | POA: Diagnosis not present

## 2020-06-28 DIAGNOSIS — Z7409 Other reduced mobility: Secondary | ICD-10-CM | POA: Diagnosis not present

## 2020-06-28 DIAGNOSIS — E119 Type 2 diabetes mellitus without complications: Secondary | ICD-10-CM | POA: Diagnosis not present

## 2020-06-28 DIAGNOSIS — R601 Generalized edema: Secondary | ICD-10-CM | POA: Diagnosis not present

## 2020-06-28 DIAGNOSIS — S22068D Other fracture of T7-T8 thoracic vertebra, subsequent encounter for fracture with routine healing: Secondary | ICD-10-CM | POA: Diagnosis not present

## 2020-06-28 DIAGNOSIS — Z7984 Long term (current) use of oral hypoglycemic drugs: Secondary | ICD-10-CM | POA: Diagnosis not present

## 2020-06-28 DIAGNOSIS — M6281 Muscle weakness (generalized): Secondary | ICD-10-CM | POA: Diagnosis not present

## 2020-06-30 DIAGNOSIS — R2689 Other abnormalities of gait and mobility: Secondary | ICD-10-CM | POA: Diagnosis not present

## 2020-07-04 ENCOUNTER — Other Ambulatory Visit: Payer: Self-pay | Admitting: Cardiovascular Disease

## 2020-07-04 DIAGNOSIS — S22058D Other fracture of T5-T6 vertebra, subsequent encounter for fracture with routine healing: Secondary | ICD-10-CM | POA: Diagnosis not present

## 2020-07-04 DIAGNOSIS — Z9981 Dependence on supplemental oxygen: Secondary | ICD-10-CM | POA: Diagnosis not present

## 2020-07-04 DIAGNOSIS — Z7409 Other reduced mobility: Secondary | ICD-10-CM | POA: Diagnosis not present

## 2020-07-04 DIAGNOSIS — S22068D Other fracture of T7-T8 thoracic vertebra, subsequent encounter for fracture with routine healing: Secondary | ICD-10-CM | POA: Diagnosis not present

## 2020-07-04 DIAGNOSIS — R601 Generalized edema: Secondary | ICD-10-CM | POA: Diagnosis not present

## 2020-07-04 DIAGNOSIS — M6281 Muscle weakness (generalized): Secondary | ICD-10-CM | POA: Diagnosis not present

## 2020-07-04 DIAGNOSIS — E119 Type 2 diabetes mellitus without complications: Secondary | ICD-10-CM | POA: Diagnosis not present

## 2020-07-04 DIAGNOSIS — N183 Chronic kidney disease, stage 3 unspecified: Secondary | ICD-10-CM | POA: Diagnosis not present

## 2020-07-04 DIAGNOSIS — Z7984 Long term (current) use of oral hypoglycemic drugs: Secondary | ICD-10-CM | POA: Diagnosis not present

## 2020-07-05 DIAGNOSIS — J0100 Acute maxillary sinusitis, unspecified: Secondary | ICD-10-CM | POA: Diagnosis not present

## 2020-07-06 DIAGNOSIS — J9611 Chronic respiratory failure with hypoxia: Secondary | ICD-10-CM | POA: Diagnosis not present

## 2020-07-06 DIAGNOSIS — J9612 Chronic respiratory failure with hypercapnia: Secondary | ICD-10-CM | POA: Diagnosis not present

## 2020-07-06 DIAGNOSIS — I5022 Chronic systolic (congestive) heart failure: Secondary | ICD-10-CM | POA: Diagnosis not present

## 2020-07-10 DIAGNOSIS — D631 Anemia in chronic kidney disease: Secondary | ICD-10-CM | POA: Diagnosis not present

## 2020-07-10 DIAGNOSIS — I129 Hypertensive chronic kidney disease with stage 1 through stage 4 chronic kidney disease, or unspecified chronic kidney disease: Secondary | ICD-10-CM | POA: Diagnosis not present

## 2020-07-10 DIAGNOSIS — N2581 Secondary hyperparathyroidism of renal origin: Secondary | ICD-10-CM | POA: Diagnosis not present

## 2020-07-10 DIAGNOSIS — N184 Chronic kidney disease, stage 4 (severe): Secondary | ICD-10-CM | POA: Diagnosis not present

## 2020-07-10 DIAGNOSIS — N390 Urinary tract infection, site not specified: Secondary | ICD-10-CM | POA: Diagnosis not present

## 2020-07-10 DIAGNOSIS — I509 Heart failure, unspecified: Secondary | ICD-10-CM | POA: Diagnosis not present

## 2020-07-11 DIAGNOSIS — S22068D Other fracture of T7-T8 thoracic vertebra, subsequent encounter for fracture with routine healing: Secondary | ICD-10-CM | POA: Diagnosis not present

## 2020-07-11 DIAGNOSIS — Z9981 Dependence on supplemental oxygen: Secondary | ICD-10-CM | POA: Diagnosis not present

## 2020-07-11 DIAGNOSIS — Z7984 Long term (current) use of oral hypoglycemic drugs: Secondary | ICD-10-CM | POA: Diagnosis not present

## 2020-07-11 DIAGNOSIS — S22058D Other fracture of T5-T6 vertebra, subsequent encounter for fracture with routine healing: Secondary | ICD-10-CM | POA: Diagnosis not present

## 2020-07-11 DIAGNOSIS — Z7409 Other reduced mobility: Secondary | ICD-10-CM | POA: Diagnosis not present

## 2020-07-11 DIAGNOSIS — N183 Chronic kidney disease, stage 3 unspecified: Secondary | ICD-10-CM | POA: Diagnosis not present

## 2020-07-11 DIAGNOSIS — M6281 Muscle weakness (generalized): Secondary | ICD-10-CM | POA: Diagnosis not present

## 2020-07-11 DIAGNOSIS — R601 Generalized edema: Secondary | ICD-10-CM | POA: Diagnosis not present

## 2020-07-11 DIAGNOSIS — E119 Type 2 diabetes mellitus without complications: Secondary | ICD-10-CM | POA: Diagnosis not present

## 2020-07-12 ENCOUNTER — Other Ambulatory Visit: Payer: Self-pay | Admitting: Cardiovascular Disease

## 2020-07-16 DIAGNOSIS — R601 Generalized edema: Secondary | ICD-10-CM | POA: Diagnosis not present

## 2020-07-16 DIAGNOSIS — N183 Chronic kidney disease, stage 3 unspecified: Secondary | ICD-10-CM | POA: Diagnosis not present

## 2020-07-16 DIAGNOSIS — Z7984 Long term (current) use of oral hypoglycemic drugs: Secondary | ICD-10-CM | POA: Diagnosis not present

## 2020-07-16 DIAGNOSIS — Z7409 Other reduced mobility: Secondary | ICD-10-CM | POA: Diagnosis not present

## 2020-07-16 DIAGNOSIS — Z9981 Dependence on supplemental oxygen: Secondary | ICD-10-CM | POA: Diagnosis not present

## 2020-07-16 DIAGNOSIS — S22058D Other fracture of T5-T6 vertebra, subsequent encounter for fracture with routine healing: Secondary | ICD-10-CM | POA: Diagnosis not present

## 2020-07-16 DIAGNOSIS — M6281 Muscle weakness (generalized): Secondary | ICD-10-CM | POA: Diagnosis not present

## 2020-07-16 DIAGNOSIS — S22068D Other fracture of T7-T8 thoracic vertebra, subsequent encounter for fracture with routine healing: Secondary | ICD-10-CM | POA: Diagnosis not present

## 2020-07-16 DIAGNOSIS — E119 Type 2 diabetes mellitus without complications: Secondary | ICD-10-CM | POA: Diagnosis not present

## 2020-07-17 DIAGNOSIS — Z7409 Other reduced mobility: Secondary | ICD-10-CM | POA: Diagnosis not present

## 2020-07-17 DIAGNOSIS — S22068D Other fracture of T7-T8 thoracic vertebra, subsequent encounter for fracture with routine healing: Secondary | ICD-10-CM | POA: Diagnosis not present

## 2020-07-17 DIAGNOSIS — Z7984 Long term (current) use of oral hypoglycemic drugs: Secondary | ICD-10-CM | POA: Diagnosis not present

## 2020-07-17 DIAGNOSIS — Z9981 Dependence on supplemental oxygen: Secondary | ICD-10-CM | POA: Diagnosis not present

## 2020-07-17 DIAGNOSIS — M6281 Muscle weakness (generalized): Secondary | ICD-10-CM | POA: Diagnosis not present

## 2020-07-17 DIAGNOSIS — E119 Type 2 diabetes mellitus without complications: Secondary | ICD-10-CM | POA: Diagnosis not present

## 2020-07-17 DIAGNOSIS — N183 Chronic kidney disease, stage 3 unspecified: Secondary | ICD-10-CM | POA: Diagnosis not present

## 2020-07-17 DIAGNOSIS — S22058D Other fracture of T5-T6 vertebra, subsequent encounter for fracture with routine healing: Secondary | ICD-10-CM | POA: Diagnosis not present

## 2020-07-17 DIAGNOSIS — R601 Generalized edema: Secondary | ICD-10-CM | POA: Diagnosis not present

## 2020-07-19 DIAGNOSIS — E119 Type 2 diabetes mellitus without complications: Secondary | ICD-10-CM | POA: Diagnosis not present

## 2020-07-19 DIAGNOSIS — Z9981 Dependence on supplemental oxygen: Secondary | ICD-10-CM | POA: Diagnosis not present

## 2020-07-19 DIAGNOSIS — S22058D Other fracture of T5-T6 vertebra, subsequent encounter for fracture with routine healing: Secondary | ICD-10-CM | POA: Diagnosis not present

## 2020-07-19 DIAGNOSIS — N183 Chronic kidney disease, stage 3 unspecified: Secondary | ICD-10-CM | POA: Diagnosis not present

## 2020-07-19 DIAGNOSIS — Z7409 Other reduced mobility: Secondary | ICD-10-CM | POA: Diagnosis not present

## 2020-07-19 DIAGNOSIS — Z7984 Long term (current) use of oral hypoglycemic drugs: Secondary | ICD-10-CM | POA: Diagnosis not present

## 2020-07-19 DIAGNOSIS — M6281 Muscle weakness (generalized): Secondary | ICD-10-CM | POA: Diagnosis not present

## 2020-07-19 DIAGNOSIS — R601 Generalized edema: Secondary | ICD-10-CM | POA: Diagnosis not present

## 2020-07-19 DIAGNOSIS — S22068D Other fracture of T7-T8 thoracic vertebra, subsequent encounter for fracture with routine healing: Secondary | ICD-10-CM | POA: Diagnosis not present

## 2020-07-20 ENCOUNTER — Telehealth: Payer: Self-pay | Admitting: Orthopaedic Surgery

## 2020-07-20 NOTE — Telephone Encounter (Signed)
Pt states she is having some sort of issue that Dr.Jenkins told her to call and talk to the nurse. She wouldn't tell me what it was.   CB [phone]

## 2020-07-23 NOTE — Telephone Encounter (Signed)
I called patient. She states that she had a fracture in her lower back and Dr. Arnoldo Morale was her doctor. She is now having some difficulties with her neck and Dr. Arnoldo Morale told her to call and see if Dr. Lorin Mercy would like to see her. She also has a knot in her hand that she would like to have Dr. Lorin Mercy look at. Appointment made for 08/07/2020 (pt had to have Mon or Tues due to son's work schedule).

## 2020-07-24 DIAGNOSIS — Z961 Presence of intraocular lens: Secondary | ICD-10-CM | POA: Diagnosis not present

## 2020-07-24 DIAGNOSIS — Z7984 Long term (current) use of oral hypoglycemic drugs: Secondary | ICD-10-CM | POA: Diagnosis not present

## 2020-07-24 DIAGNOSIS — H524 Presbyopia: Secondary | ICD-10-CM | POA: Diagnosis not present

## 2020-07-24 DIAGNOSIS — H04123 Dry eye syndrome of bilateral lacrimal glands: Secondary | ICD-10-CM | POA: Diagnosis not present

## 2020-07-24 DIAGNOSIS — E119 Type 2 diabetes mellitus without complications: Secondary | ICD-10-CM | POA: Diagnosis not present

## 2020-07-24 DIAGNOSIS — H5213 Myopia, bilateral: Secondary | ICD-10-CM | POA: Diagnosis not present

## 2020-07-24 DIAGNOSIS — H52203 Unspecified astigmatism, bilateral: Secondary | ICD-10-CM | POA: Diagnosis not present

## 2020-07-26 ENCOUNTER — Telehealth: Payer: Self-pay

## 2020-07-26 DIAGNOSIS — E7800 Pure hypercholesterolemia, unspecified: Secondary | ICD-10-CM | POA: Diagnosis not present

## 2020-07-26 DIAGNOSIS — N184 Chronic kidney disease, stage 4 (severe): Secondary | ICD-10-CM | POA: Diagnosis not present

## 2020-07-26 DIAGNOSIS — S22058D Other fracture of T5-T6 vertebra, subsequent encounter for fracture with routine healing: Secondary | ICD-10-CM | POA: Diagnosis not present

## 2020-07-26 DIAGNOSIS — J4520 Mild intermittent asthma, uncomplicated: Secondary | ICD-10-CM | POA: Diagnosis not present

## 2020-07-26 DIAGNOSIS — N183 Chronic kidney disease, stage 3 unspecified: Secondary | ICD-10-CM | POA: Diagnosis not present

## 2020-07-26 DIAGNOSIS — R601 Generalized edema: Secondary | ICD-10-CM | POA: Diagnosis not present

## 2020-07-26 DIAGNOSIS — E1122 Type 2 diabetes mellitus with diabetic chronic kidney disease: Secondary | ICD-10-CM | POA: Diagnosis not present

## 2020-07-26 DIAGNOSIS — I10 Essential (primary) hypertension: Secondary | ICD-10-CM | POA: Diagnosis not present

## 2020-07-26 DIAGNOSIS — E1169 Type 2 diabetes mellitus with other specified complication: Secondary | ICD-10-CM | POA: Diagnosis not present

## 2020-07-26 DIAGNOSIS — K219 Gastro-esophageal reflux disease without esophagitis: Secondary | ICD-10-CM | POA: Diagnosis not present

## 2020-07-26 DIAGNOSIS — Z7984 Long term (current) use of oral hypoglycemic drugs: Secondary | ICD-10-CM | POA: Diagnosis not present

## 2020-07-26 DIAGNOSIS — M6281 Muscle weakness (generalized): Secondary | ICD-10-CM | POA: Diagnosis not present

## 2020-07-26 DIAGNOSIS — S22068D Other fracture of T7-T8 thoracic vertebra, subsequent encounter for fracture with routine healing: Secondary | ICD-10-CM | POA: Diagnosis not present

## 2020-07-26 DIAGNOSIS — E119 Type 2 diabetes mellitus without complications: Secondary | ICD-10-CM | POA: Diagnosis not present

## 2020-07-26 DIAGNOSIS — I5033 Acute on chronic diastolic (congestive) heart failure: Secondary | ICD-10-CM | POA: Diagnosis not present

## 2020-07-26 DIAGNOSIS — Z7409 Other reduced mobility: Secondary | ICD-10-CM | POA: Diagnosis not present

## 2020-07-26 DIAGNOSIS — G8929 Other chronic pain: Secondary | ICD-10-CM | POA: Diagnosis not present

## 2020-07-26 DIAGNOSIS — Z9981 Dependence on supplemental oxygen: Secondary | ICD-10-CM | POA: Diagnosis not present

## 2020-07-26 NOTE — Telephone Encounter (Signed)
Almira from [facility] called she stated the patient is requesting a sooner appointment due to a forming black spot on the patients big toe I advised Almira that Dr.Yates next available appointment wont be until 6/28 and the patient is already scheduled for that day,the patient is requesting to be worked into the schedule to be seen sooner if possible call [phone]

## 2020-07-26 NOTE — Telephone Encounter (Signed)
I called Almira, voicemail picked up.   I called patient and made work in appointment for tomorrow afternoon.

## 2020-07-27 ENCOUNTER — Other Ambulatory Visit: Payer: Self-pay

## 2020-07-27 ENCOUNTER — Ambulatory Visit (INDEPENDENT_AMBULATORY_CARE_PROVIDER_SITE_OTHER): Payer: Medicare Other | Admitting: Orthopaedic Surgery

## 2020-07-27 ENCOUNTER — Encounter: Payer: Self-pay | Admitting: Orthopaedic Surgery

## 2020-07-27 DIAGNOSIS — T148XXA Other injury of unspecified body region, initial encounter: Secondary | ICD-10-CM

## 2020-07-27 NOTE — Progress Notes (Signed)
Office Visit Note   Patient: [name]           Date of Birth: July 12, 1945           MRN: [id number] Visit Date: 07/27/2020              Requested by: No referring provider defined for this encounter. PCP: Pcp, No   Assessment & Plan: Visit Diagnoses:  1. Blood blister       Tip of left great toe 3 mm.    Plan: We discussed care of the blood blister.  She needs to wear closed toed shoes she understands this may have occurred from either having the skin pinched or with pressure and rubbing.  Skin care discussed.  She will return if she has increased problems.  Follow-Up Instructions: Return if symptoms worsen or fail to improve.   Orders:  No orders of the defined types were placed in this encounter.  No orders of the defined types were placed in this encounter.     Procedures: No procedures performed   Clinical Data: No additional findings.   Subjective: Chief Complaint  Patient presents with   Neck - Pain   Left Foot - Pain   Right Hand - Pain    HPI 75 year old female seen with this spot on the tip of her left great toe which was black started yesterday she is concerned that it might be early gangrene.  She is also noted some small prominence over the dorsum of the left hand near the wrist joint more prominent with the wrist in flexion.  Patient states that sometimes she uses her hand a lot she has aching pain.  Patient fell in February try to catch her self with outstretched hands states her neck had flexion extension moment with the fall.  She noted some crepitus with rotation.  Sometimes she has had hand numbness right greater than left.  She had recent fracture T6 followed by neurosurgery and is in a brace.  Past history of gout in the past.  Review of Systems positive for obesity asthma heart failure pacemaker, cardiac stenting no current chest pain.  No dyspnea.  Previous cervical fusion.  All systems noncontributory HPI.   Objective: Vital Signs:  There were no vitals taken for this visit.  Physical Exam Constitutional:      Appearance: She is well-developed.  HENT:     Head: Normocephalic.     Right Ear: External ear normal.     Left Ear: External ear normal. There is no impacted cerumen.  Eyes:     Pupils: Pupils are equal, round, and reactive to light.  Neck:     Thyroid: No thyromegaly.     Trachea: No tracheal deviation.  Cardiovascular:     Rate and Rhythm: Normal rate.  Pulmonary:     Effort: Pulmonary effort is normal.  Abdominal:     Palpations: Abdomen is soft.  Musculoskeletal:     Cervical back: No rigidity.  Skin:    General: Skin is warm and dry.  Neurological:     Mental Status: She is alert and oriented to person, place, and time.  Psychiatric:        Behavior: Behavior normal.    Ortho Exam patient is wearing sandals.  She has 2 mm to 3 mm blood blister at the tip of her great toe.  No plantar foot lesions no cellulitis.  Toe flexion extension function is intact.  She does not recall hitting  her toe or any problems with the rubbing against the shoe.  No cellulitis no lymphadenopathy.  Specialty Comments:  No specialty comments available.  Imaging: No results found.   PMFS History: Patient Active Problem List   Diagnosis Date Noted   Blood blister 08/01/2020   Elevated troponin 06/01/2019   Constipation 10/21/2018   Chronic headaches 07/30/2018   Congestive heart failure (CHF) ([place]) 07/30/2018   Supplemental oxygen dependent 07/30/2018   Leukopenia 05/03/2018   Thrombocytopenia ([place]) 05/03/2018   Pneumonia due to human metapneumovirus 05/02/2018   Chest pain 05/01/2018   Allergic rhinitis 04/13/2018   S/P laparoscopic cholecystectomy 04/09/2018   Chronic respiratory failure with hypoxia and hypercapnia ([place]) 03/07/2018   Polyp of cecum    Polyp of transverse colon    Positive colorectal cancer screening using Cologuard test    Dysphagia    Acute on chronic renal failure ([place])  11/20/2017   CAD (coronary artery disease) 11/20/2017   DM2 (diabetes mellitus, type 2) ([place]) 11/20/2017   Abdominal pain, epigastric 11/20/2017   Vasomotor rhinitis 10/16/2016   Right facial numbness 03/05/2016   CKD (chronic kidney disease), stage III ([place]) 03/05/2016   Atrial fibrillation ([place]) 10/24/2015   Chest pain with moderate risk of acute coronary syndrome 10/23/2015   History of TIA (transient ischemic attack) 10/22/2015   Complete heart block ([place]) 06/22/2015   Allergic drug rash due to anti-infective agent 04/29/2015   Fatigue 02/14/2015   Chronic diastolic CHF (congestive heart failure) ([place]) 02/14/2015   Cellulitis 12/21/2014   S/P CABG x 5 11/29/2014   CAD S/P percutaneous coronary angioplasty    Diarrhea 07/12/2014   Generalized abdominal pain 24/10/7351   Diastolic CHF, acute on chronic ([place]) 11/04/2013   Mixed hypercholesterolemia and hypertriglyceridemia 04/22/2013   Vertigo 04/22/2013   Dyspnea on exertion 08/25/2012   Allergic to IV contrast 07/23/2012   Unstable angina ([place]) 07/22/2012   Morbid (severe) obesity due to excess calories ([place]) 11/22/2011   Gastroesophageal reflux disease 11/22/2011   Back pain 11/22/2011   OSA (obstructive sleep apnea) 11/22/2011   HEMORRHOIDS-EXTERNAL 02/21/2010   NAUSEA 02/21/2010   ABDOMINAL PAIN -GENERALIZED 02/21/2010   PERSONAL HX COLONIC POLYPS 02/21/2010   ANEMIA 04/16/2007   Essential hypertension 04/16/2007   DIVERTICULOSIS, COLON 04/16/2007   ARTHRITIS 04/16/2007   INTERNAL HEMORRHOIDS WITHOUT MENTION COMP 04/12/2007   Asthma 04/12/2007   Cardiac resynchronization therapy pacemaker (CRT-P) in place 04/12/2007   Gastritis without bleeding 12/14/2006   COLONIC POLYPS, HYPERPLASTIC 09/27/2002   FATTY LIVER DISEASE 02/16/2002   Past Medical History:  Diagnosis Date   ABDOMINAL PAIN -GENERALIZED 02/21/2010   Qualifier: Diagnosis of  By: Chester Holstein NP, Nevin Bloodgood     AICD (automatic cardioverter/defibrillator) present     Anemia    Arthritis    Asthma    Atrial fibrillation ([place]) 10/24/2015   Questionable history of A Fib/A Flutter. On device interrogation on 9/7, showed 0.53min of A Fib/A Flutter with 1% burden.  Device check in Jan 2018 showed no sustained AF (runs of less than 30 seconds). No anticoagulation indicated.   CAD (coronary artery disease)    a. 10/09/16 LHC: SVG->LAD patent, SVG->Diag patent, SVG->RCA patent, SVG->LCx occluded. EF 60%, b. 10/31/16 LHC DES to AV groove Circ, DES to intermed branch   Cellulitis and abscess of foot 12/2014   RT FOOT   CHF (congestive heart failure) (HCC)    Chronic bronchitis (HCC)    Chronic lower back pain    Complete heart block ([place]) 06/22/2015  Diverticulosis    Facial numbness 02/2016   Fatty liver disease, nonalcoholic    Gastritis    GERD (gastroesophageal reflux disease)    Gout    H/O hiatal hernia    HEMORRHOIDS-EXTERNAL 02/21/2010   Qualifier: Diagnosis of  By: Chester Holstein NP, Paula     High cholesterol    Hyperplastic colon polyp    Hypertension    IBS (irritable bowel syndrome)    Internal hemorrhoids    INTERNAL HEMORRHOIDS WITHOUT MENTION COMP 04/12/2007   Qualifier: Diagnosis of  By: Olevia Perches MD, Lowella Bandy    Ischemic cardiomyopathy    Nonischemic cardiomyopathy ([place]) 11/22/2011   Pt responded to BiV ICD- last EF 55-60% Sept 2017   Obesity    On home oxygen therapy    "2L at night" (10/31/2016)   OSA (obstructive sleep apnea)    "can't tolerate a mask" (10/30/2016)   PERSONAL HX COLONIC POLYPS 02/21/2010   Qualifier: Diagnosis of  By: Chester Holstein NP, Paula     Pneumonia    "couple times" (10/31/2016)   Right facial numbness 03/05/2016   Shortness of breath    TIA (transient ischemic attack)    "recently" (10/31/2016)   Type II diabetes mellitus ([place])     Family History  Problem Relation Age of Onset   Breast cancer Mother    Diabetes Mother    Heart disease Maternal Grandmother    Kidney disease Maternal Grandmother    Diabetes Maternal  Grandmother    Glaucoma Maternal Aunt    Heart disease Maternal Aunt    Asthma Sister    Colon cancer Neg Hx    Stomach cancer Neg Hx    Pancreatic cancer Neg Hx     Past Surgical History:  Procedure Laterality Date   ABDOMINAL ULTRASOUND  12/01/2011   Peripelvic cysts- #1- 2.4x1.9x2.3cm, #2-1.2x0.9x1.2cm   ANKLE FRACTURE SURGERY Right    "had rod put in"   ANTERIOR CERVICAL DECOMP/DISCECTOMY FUSION     APPENDECTOMY     BACK SURGERY     BIOPSY  12/29/2017   Procedure: BIOPSY;  Surgeon: [name], MD;  Location: [facility];  Service: Endoscopy;;   BIV ICD INSERTION CRT-D  2001?   BIV PACEMAKER GENERATOR CHANGE OUT N/A 11/02/2012   Procedure: BIV PACEMAKER GENERATOR CHANGE OUT;  Surgeon: Sanda Klein, MD;  Location: [facility];  Service: Cardiovascular;  Laterality: N/A;   CARDIAC CATHETERIZATION  05/17/1999   No significant coronary obstructive disease w/ mild 20% luminal irregularity of the first diag branch of the LAD   CARDIAC CATHETERIZATION  07/08/2002   No significant CAD, moderately depressed LV systolic function   CARDIAC CATHETERIZATION Bilateral 04/26/2007   Normal findings, recommend medical therapy   CARDIAC CATHETERIZATION  02/18/2008   Moderate CAD, would benefit from having a functional study, recommend continue medical therapy   CARDIAC CATHETERIZATION  07/23/2012   Medical therapy   CARDIAC CATHETERIZATION N/A 11/24/2014   Procedure: Left Heart Cath and Coronary Angiography;  Surgeon: Troy Sine, MD;  Location: [facility];  Service: Cardiovascular;  Laterality: N/A;   CARDIAC CATHETERIZATION  11/27/2014   Procedure: Intravascular Pressure Wire/FFR Study;  Surgeon: Peter M Martinique, MD;  Location: [facility];  Service: Cardiovascular;;   CARDIAC CATHETERIZATION  10/09/2016   CHOLECYSTECTOMY N/A 04/09/2018   Procedure: LAPAROSCOPIC CHOLECYSTECTOMY;  Surgeon: Greer Pickerel, MD;  Location: [facility];  Service: General;  Laterality: N/A;    COLONOSCOPY WITH PROPOFOL N/A 12/29/2017   Procedure:  COLONOSCOPY WITH PROPOFOL;  Surgeon: [name], MD;  [facility];  Service: Endoscopy;  Laterality: N/A;   CORONARY ANGIOGRAM  2010   CORONARY ARTERY BYPASS GRAFT N/A 11/29/2014   Procedure: CORONARY ARTERY BYPASS GRAFTING x 5 (LIMA-LAD, SVG-D, SVG-OM1-OM2, SVG-PD);  Surgeon: Melrose Nakayama, MD;  Location: [facility];  Service: Open Heart Surgery;  Laterality: N/A;   CORONARY STENT INTERVENTION N/A 10/31/2016   Procedure: CORONARY STENT INTERVENTION;  Surgeon: Burnell Blanks, MD;  Location: [facility];  Service: Cardiovascular;  Laterality: N/A;   ESOPHAGOGASTRODUODENOSCOPY (EGD) WITH PROPOFOL N/A 12/29/2017   Procedure: ESOPHAGOGASTRODUODENOSCOPY (EGD) WITH PROPOFOL;  Surgeon: [name], MD;  [facility];  Service: Endoscopy;  Laterality: N/A;   FRACTURE SURGERY     INSERT / REPLACE / [facility] ARTHROSCOPY Bilateral    LEFT HEART CATH AND CORS/GRAFTS ANGIOGRAPHY N/A 12/09/2017   Procedure: LEFT HEART CATH AND CORS/GRAFTS ANGIOGRAPHY;  Surgeon: Troy Sine, MD;  Location: [facility];  Service: Cardiovascular;  Laterality: N/A;   LEFT HEART CATHETERIZATION WITH CORONARY ANGIOGRAM N/A 07/23/2012   Procedure: LEFT HEART CATHETERIZATION WITH CORONARY ANGIOGRAM;  Surgeon: Leonie Man, MD;  Location: [facility];  Service: Cardiovascular;  Laterality: N/A;   LEXISCAN MYOVIEW  11/14/2011   Mild-moderate defect seen in Mid Inferolateral and Mid Anterolateral regions-consistant w/ infarct/scar. No significant ischemia demonstrated.   POLYPECTOMY  12/29/2017   Procedure: POLYPECTOMY;  Surgeon: [name], MD;  [facility];  Service: Endoscopy;;   RIGHT/LEFT HEART CATH AND CORONARY ANGIOGRAPHY N/A 10/09/2016   Procedure: RIGHT/LEFT HEART CATH AND CORONARY ANGIOGRAPHY;  Surgeon: Jolaine Artist, MD;  Location: [facility];  Service:  Cardiovascular;  Laterality: N/A;   RIGHT/LEFT HEART CATH AND CORONARY/GRAFT ANGIOGRAPHY N/A 03/11/2019   Procedure: RIGHT/LEFT HEART CATH AND CORONARY/GRAFT ANGIOGRAPHY;  Surgeon: Jolaine Artist, MD;  Location: [facility];  Service: Cardiovascular;  Laterality: N/A;   TEE WITHOUT CARDIOVERSION N/A 11/29/2014   Procedure: TRANSESOPHAGEAL ECHOCARDIOGRAM (TEE);  Surgeon: Melrose Nakayama, MD;  Location: [facility];  Service: Open Heart Surgery;  Laterality: N/A;   TRANSTHORACIC ECHOCARDIOGRAM  07/23/2012   EF 55-60%, normal-mild   TUBAL LIGATION     Social History   Occupational History   Occupation: STAFF/BUFFET    Employer: [employer]  Tobacco Use   Smoking status: Former    Packs/day: 0.25    Years: 3.00    Pack years: 0.75    Types: Cigarettes    Quit date: 02/11/1968    Years since quitting: 52.5   Smokeless tobacco: Never  Vaping Use   Vaping Use: Never used  Substance and Sexual Activity   Alcohol use: No   Drug use: No   Sexual activity: Never

## 2020-07-31 DIAGNOSIS — S22058D Other fracture of T5-T6 vertebra, subsequent encounter for fracture with routine healing: Secondary | ICD-10-CM | POA: Diagnosis not present

## 2020-07-31 DIAGNOSIS — R2689 Other abnormalities of gait and mobility: Secondary | ICD-10-CM | POA: Diagnosis not present

## 2020-07-31 DIAGNOSIS — R601 Generalized edema: Secondary | ICD-10-CM | POA: Diagnosis not present

## 2020-07-31 DIAGNOSIS — Z9981 Dependence on supplemental oxygen: Secondary | ICD-10-CM | POA: Diagnosis not present

## 2020-07-31 DIAGNOSIS — S22068D Other fracture of T7-T8 thoracic vertebra, subsequent encounter for fracture with routine healing: Secondary | ICD-10-CM | POA: Diagnosis not present

## 2020-07-31 DIAGNOSIS — N183 Chronic kidney disease, stage 3 unspecified: Secondary | ICD-10-CM | POA: Diagnosis not present

## 2020-07-31 DIAGNOSIS — E119 Type 2 diabetes mellitus without complications: Secondary | ICD-10-CM | POA: Diagnosis not present

## 2020-07-31 DIAGNOSIS — Z7984 Long term (current) use of oral hypoglycemic drugs: Secondary | ICD-10-CM | POA: Diagnosis not present

## 2020-07-31 DIAGNOSIS — Z7409 Other reduced mobility: Secondary | ICD-10-CM | POA: Diagnosis not present

## 2020-07-31 DIAGNOSIS — M6281 Muscle weakness (generalized): Secondary | ICD-10-CM | POA: Diagnosis not present

## 2020-08-01 DIAGNOSIS — Z9981 Dependence on supplemental oxygen: Secondary | ICD-10-CM | POA: Diagnosis not present

## 2020-08-01 DIAGNOSIS — N183 Chronic kidney disease, stage 3 unspecified: Secondary | ICD-10-CM | POA: Diagnosis not present

## 2020-08-01 DIAGNOSIS — T148XXA Other injury of unspecified body region, initial encounter: Secondary | ICD-10-CM | POA: Insufficient documentation

## 2020-08-01 DIAGNOSIS — S22068D Other fracture of T7-T8 thoracic vertebra, subsequent encounter for fracture with routine healing: Secondary | ICD-10-CM | POA: Diagnosis not present

## 2020-08-01 DIAGNOSIS — M6281 Muscle weakness (generalized): Secondary | ICD-10-CM | POA: Diagnosis not present

## 2020-08-01 DIAGNOSIS — S22058D Other fracture of T5-T6 vertebra, subsequent encounter for fracture with routine healing: Secondary | ICD-10-CM | POA: Diagnosis not present

## 2020-08-01 DIAGNOSIS — R601 Generalized edema: Secondary | ICD-10-CM | POA: Diagnosis not present

## 2020-08-01 DIAGNOSIS — Z7984 Long term (current) use of oral hypoglycemic drugs: Secondary | ICD-10-CM | POA: Diagnosis not present

## 2020-08-01 DIAGNOSIS — Z7409 Other reduced mobility: Secondary | ICD-10-CM | POA: Diagnosis not present

## 2020-08-01 DIAGNOSIS — E119 Type 2 diabetes mellitus without complications: Secondary | ICD-10-CM | POA: Diagnosis not present

## 2020-08-04 IMAGING — MG MM DIGITAL DIAGNOSTIC UNILAT*L* W/ TOMO W/ CAD
6 of 10 series · 6 of 30 positions shown · non-contrast
Comparison: Previous exam(s).

CLINICAL DATA: 74-year-old female presenting with a lump in the
left breast for approximately 1 month that she noticed after
recently losing weight.

EXAM:
DIGITAL DIAGNOSTIC LEFT MAMMOGRAM WITH TOMO
ULTRASOUND LEFT BREAST

[L CC synth-2D (1 of 2)]
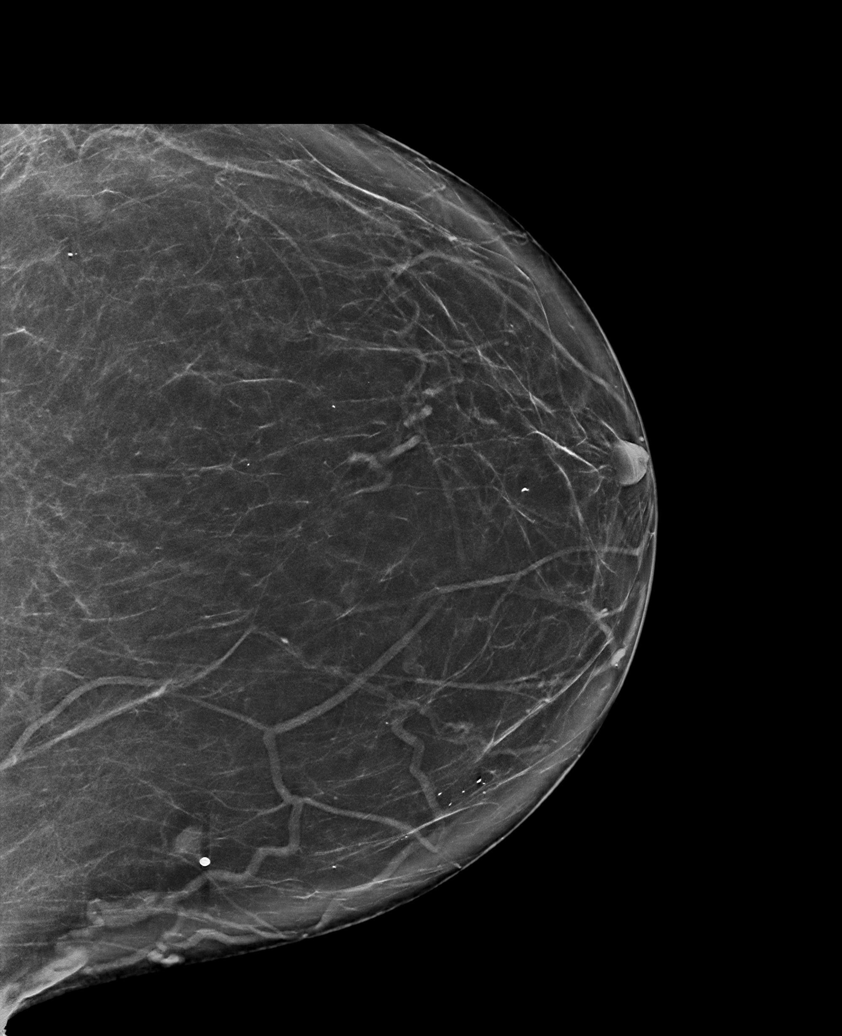

[L CC synth-2D (2 of 2)]
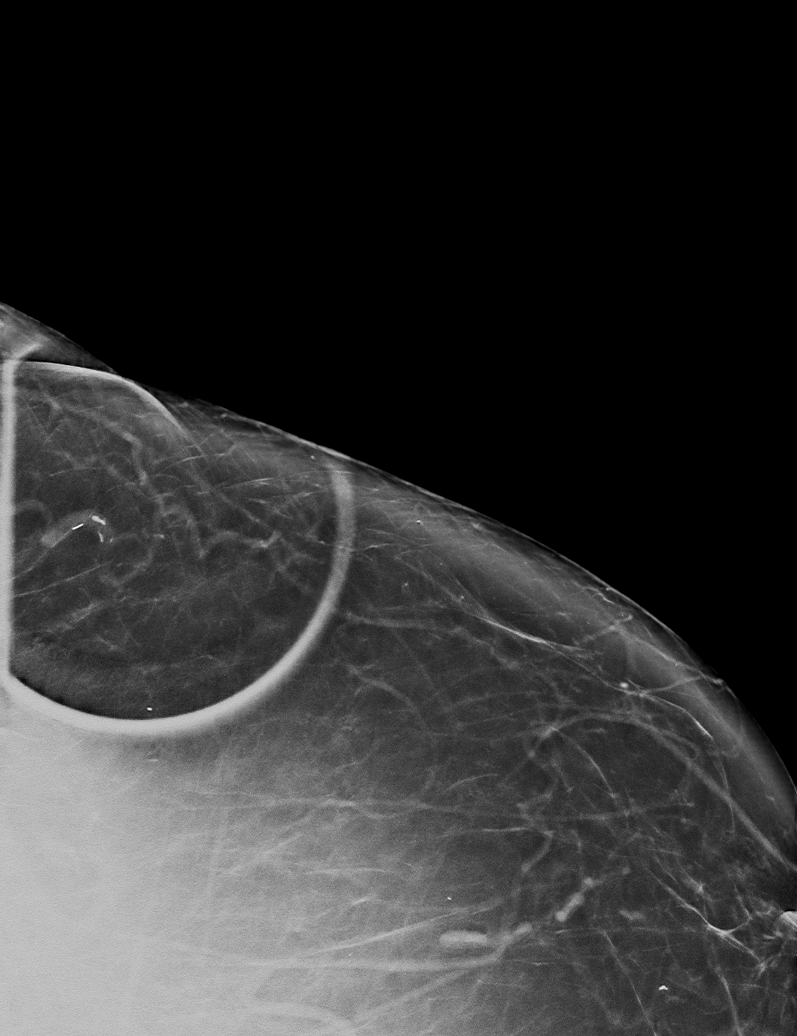

[L TAN synth-2D]
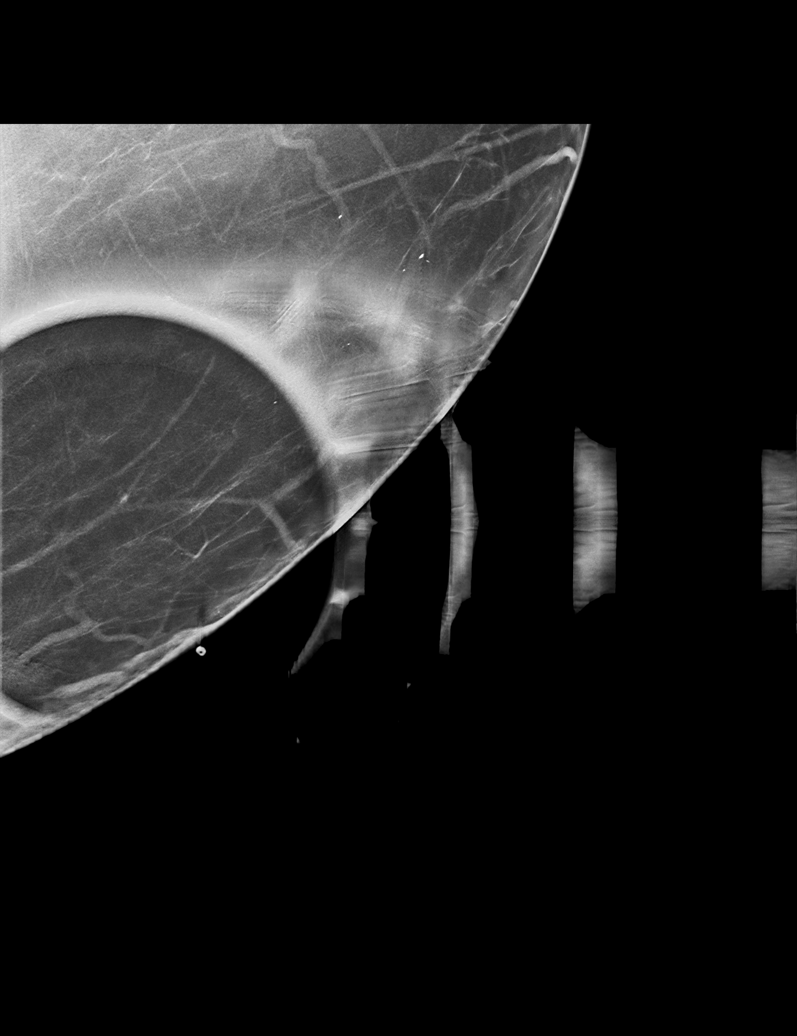

[L MLO synth-2D]
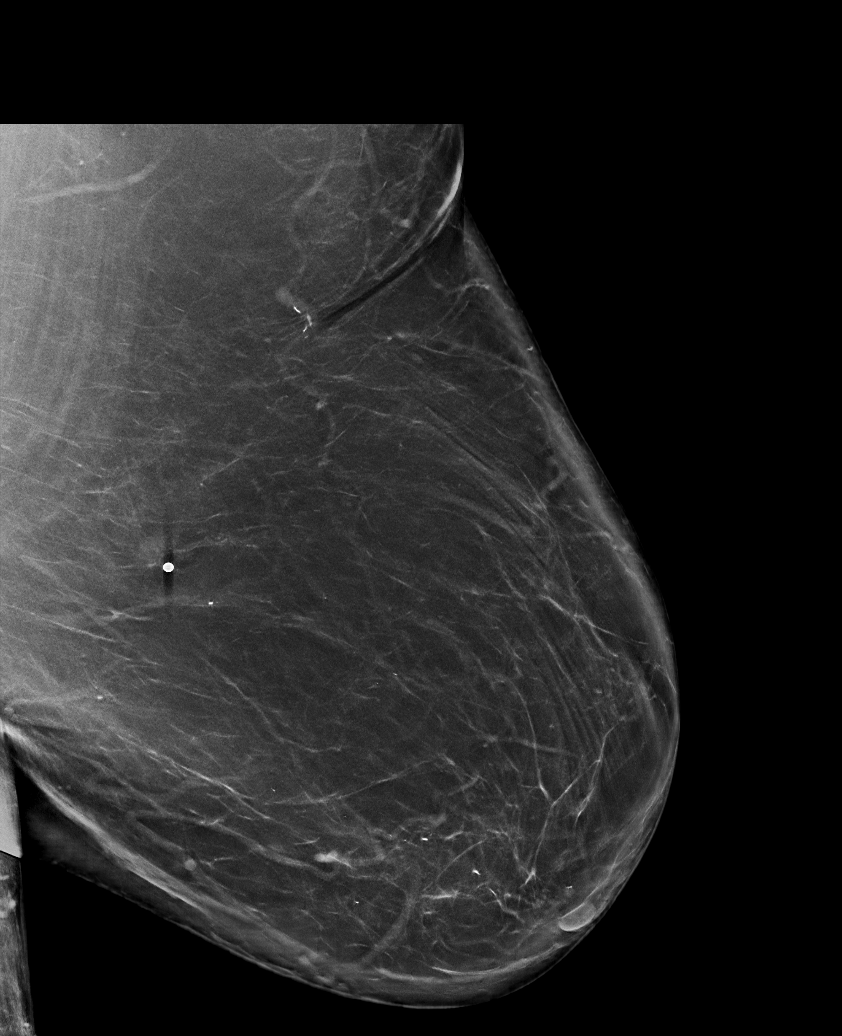

[L ML synth-2D]
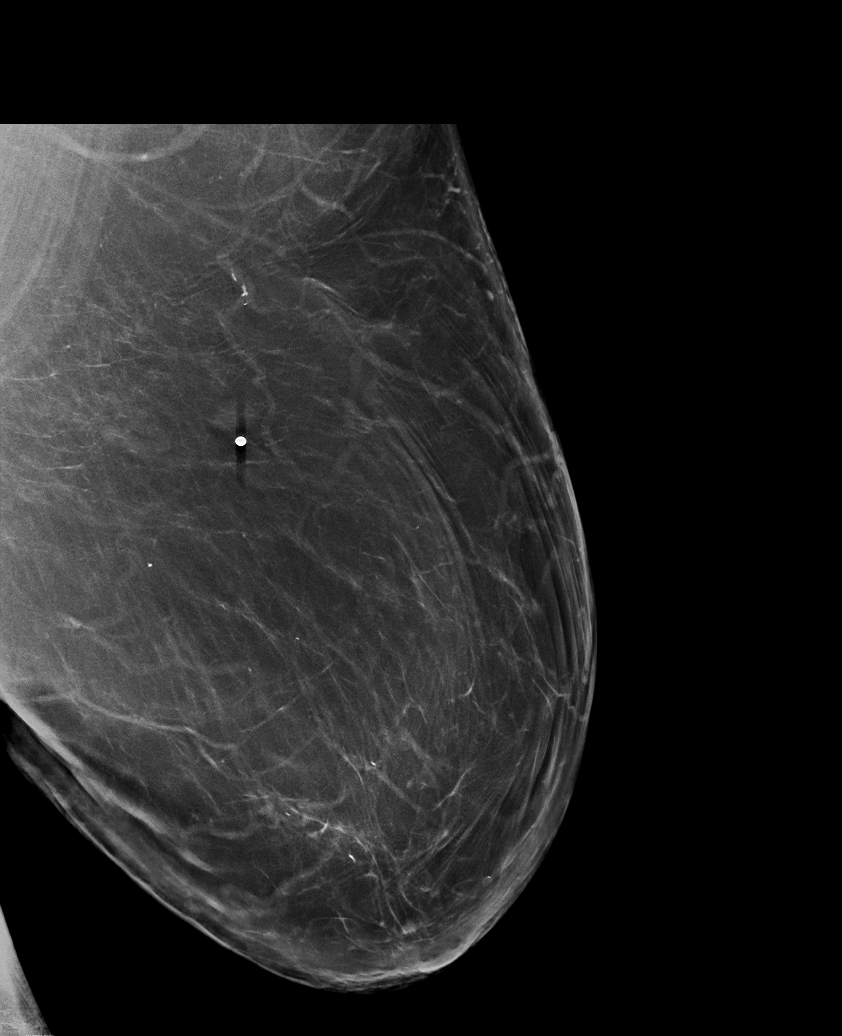

[L CC tomo · tomo slice 42/83.0]
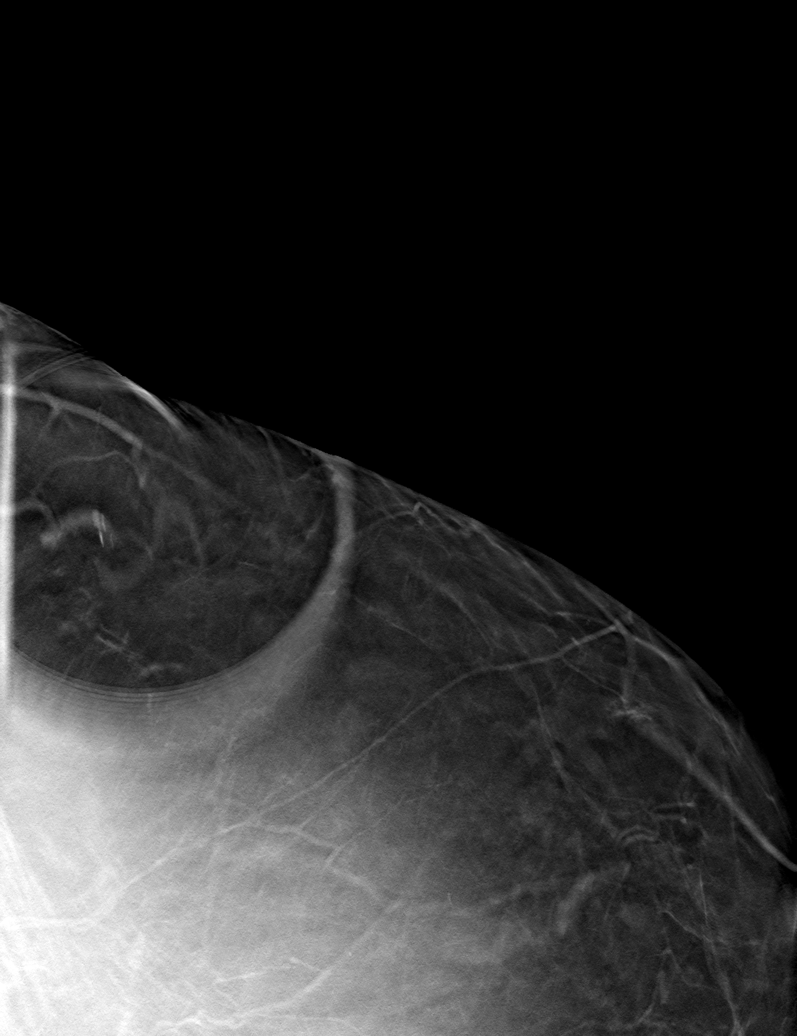

[6 of 30 positions shown; findings below may reference images not displayed]

ACR Breast Density Category b: There are scattered areas of
fibroglandular density.
FINDINGS: Mammogram:

Full field and spot compression tomosynthesis views of the left
breast were performed. A skin BB marks the palpable site of concern
in the medial left breast. At the palpable site there is a
superficial mass at appears to be within the skin measuring 0.9 cm.

On physical exam, there is a small raised skin lesion with a visible
pore.

Ultrasound:

Targeted ultrasound is performed in the left breast at 10 o'clock 15
cm from the nipple demonstrating an oval circumscribed hypoechoic
mass measuring 0.8 x 0.4 x 0.8 cm that is within the skin. This is
consistent with a benign sebaceous cyst.
IMPRESSION: At the palpable site of concern in the left breast there is a benign
sebaceous cyst. No mammographic or sonographic evidence of
malignancy.

RECOMMENDATION:
Return to annual screening mammography which will be due in
October 2019.

I have discussed the findings and recommendations with the patient.
If applicable, a reminder letter will be sent to the patient
regarding the next appointment.

BI-RADS CATEGORY  2: Benign.

## 2020-08-06 DIAGNOSIS — J9611 Chronic respiratory failure with hypoxia: Secondary | ICD-10-CM | POA: Diagnosis not present

## 2020-08-06 DIAGNOSIS — J9612 Chronic respiratory failure with hypercapnia: Secondary | ICD-10-CM | POA: Diagnosis not present

## 2020-08-06 DIAGNOSIS — I5022 Chronic systolic (congestive) heart failure: Secondary | ICD-10-CM | POA: Diagnosis not present

## 2020-08-07 ENCOUNTER — Ambulatory Visit: Payer: Medicare Other | Admitting: Orthopaedic Surgery

## 2020-08-07 DIAGNOSIS — Z9981 Dependence on supplemental oxygen: Secondary | ICD-10-CM | POA: Diagnosis not present

## 2020-08-07 DIAGNOSIS — Z7409 Other reduced mobility: Secondary | ICD-10-CM | POA: Diagnosis not present

## 2020-08-07 DIAGNOSIS — Z7984 Long term (current) use of oral hypoglycemic drugs: Secondary | ICD-10-CM | POA: Diagnosis not present

## 2020-08-07 DIAGNOSIS — M6281 Muscle weakness (generalized): Secondary | ICD-10-CM | POA: Diagnosis not present

## 2020-08-07 DIAGNOSIS — E119 Type 2 diabetes mellitus without complications: Secondary | ICD-10-CM | POA: Diagnosis not present

## 2020-08-07 DIAGNOSIS — R601 Generalized edema: Secondary | ICD-10-CM | POA: Diagnosis not present

## 2020-08-07 DIAGNOSIS — S22068D Other fracture of T7-T8 thoracic vertebra, subsequent encounter for fracture with routine healing: Secondary | ICD-10-CM | POA: Diagnosis not present

## 2020-08-07 DIAGNOSIS — N183 Chronic kidney disease, stage 3 unspecified: Secondary | ICD-10-CM | POA: Diagnosis not present

## 2020-08-07 DIAGNOSIS — S22058D Other fracture of T5-T6 vertebra, subsequent encounter for fracture with routine healing: Secondary | ICD-10-CM | POA: Diagnosis not present

## 2020-08-15 ENCOUNTER — Telehealth: Payer: Self-pay | Admitting: Cardiovascular Disease

## 2020-08-15 NOTE — Telephone Encounter (Signed)
*  STAT* If patient is at the pharmacy, call can be transferred to refill team.   1. Which medications need to be refilled? (please list name of each medication and dose if known) rasuvastatin 20 mg  2. Which pharmacy/location (including street and city if local pharmacy) is medication to be sent to? [facility]   3. Do they need a 30 day or 90 day supply? 90    Patient is out

## 2020-08-16 DIAGNOSIS — R310 Gross hematuria: Secondary | ICD-10-CM | POA: Diagnosis not present

## 2020-08-17 MED ORDER — ROSUVASTATIN CALCIUM 20 MG PO TABS: 20.0000 mg | ORAL_TABLET | Freq: Every day | ORAL | 0 refills | Status: DC

## 2020-08-30 DIAGNOSIS — R2689 Other abnormalities of gait and mobility: Secondary | ICD-10-CM | POA: Diagnosis not present

## 2020-09-04 DIAGNOSIS — I700 Atherosclerosis of aorta: Secondary | ICD-10-CM | POA: Diagnosis not present

## 2020-09-04 DIAGNOSIS — N281 Cyst of kidney, acquired: Secondary | ICD-10-CM | POA: Diagnosis not present

## 2020-09-04 DIAGNOSIS — R310 Gross hematuria: Secondary | ICD-10-CM | POA: Diagnosis not present

## 2020-09-05 DIAGNOSIS — J9611 Chronic respiratory failure with hypoxia: Secondary | ICD-10-CM | POA: Diagnosis not present

## 2020-09-05 DIAGNOSIS — J9612 Chronic respiratory failure with hypercapnia: Secondary | ICD-10-CM | POA: Diagnosis not present

## 2020-09-05 DIAGNOSIS — I5022 Chronic systolic (congestive) heart failure: Secondary | ICD-10-CM | POA: Diagnosis not present

## 2020-09-10 DIAGNOSIS — I639 Cerebral infarction, unspecified: Secondary | ICD-10-CM

## 2020-09-10 DIAGNOSIS — R0902 Hypoxemia: Secondary | ICD-10-CM | POA: Diagnosis not present

## 2020-09-10 DIAGNOSIS — R519 Headache, unspecified: Secondary | ICD-10-CM | POA: Diagnosis not present

## 2020-09-10 DIAGNOSIS — R0602 Shortness of breath: Secondary | ICD-10-CM | POA: Diagnosis not present

## 2020-09-10 DIAGNOSIS — R5383 Other fatigue: Secondary | ICD-10-CM | POA: Diagnosis not present

## 2020-09-10 HISTORY — DX: Cerebral infarction, unspecified: I63.9

## 2020-09-16 ENCOUNTER — Emergency Department (HOSPITAL_COMMUNITY): Payer: Medicare Other

## 2020-09-16 ENCOUNTER — Encounter (HOSPITAL_COMMUNITY): Payer: Self-pay

## 2020-09-16 ENCOUNTER — Observation Stay (HOSPITAL_COMMUNITY)
Admission: EM | Admit: 2020-09-16 | Discharge: 2020-09-19 | Disposition: A | Discharge status: Home / Self Care | Payer: Medicare Other | Attending: Internal Medicine | Admitting: Internal Medicine

## 2020-09-16 ENCOUNTER — Other Ambulatory Visit: Payer: Self-pay

## 2020-09-16 DIAGNOSIS — R41841 Cognitive communication deficit: Secondary | ICD-10-CM | POA: Diagnosis not present

## 2020-09-16 DIAGNOSIS — J9611 Chronic respiratory failure with hypoxia: Secondary | ICD-10-CM | POA: Diagnosis present

## 2020-09-16 DIAGNOSIS — J811 Chronic pulmonary edema: Secondary | ICD-10-CM | POA: Diagnosis not present

## 2020-09-16 DIAGNOSIS — Z7902 Long term (current) use of antithrombotics/antiplatelets: Secondary | ICD-10-CM | POA: Diagnosis not present

## 2020-09-16 DIAGNOSIS — Z20822 Contact with and (suspected) exposure to covid-19: Secondary | ICD-10-CM | POA: Diagnosis not present

## 2020-09-16 DIAGNOSIS — G4730 Sleep apnea, unspecified: Secondary | ICD-10-CM | POA: Diagnosis present

## 2020-09-16 DIAGNOSIS — N1831 Chronic kidney disease, stage 3a: Secondary | ICD-10-CM | POA: Diagnosis not present

## 2020-09-16 DIAGNOSIS — R079 Chest pain, unspecified: Secondary | ICD-10-CM | POA: Diagnosis not present

## 2020-09-16 DIAGNOSIS — I130 Hypertensive heart and chronic kidney disease with heart failure and stage 1 through stage 4 chronic kidney disease, or unspecified chronic kidney disease: Secondary | ICD-10-CM | POA: Insufficient documentation

## 2020-09-16 DIAGNOSIS — I2510 Atherosclerotic heart disease of native coronary artery without angina pectoris: Secondary | ICD-10-CM | POA: Diagnosis not present

## 2020-09-16 DIAGNOSIS — G459 Transient cerebral ischemic attack, unspecified: Secondary | ICD-10-CM | POA: Diagnosis not present

## 2020-09-16 DIAGNOSIS — J9610 Chronic respiratory failure, unspecified whether with hypoxia or hypercapnia: Secondary | ICD-10-CM | POA: Diagnosis present

## 2020-09-16 DIAGNOSIS — I517 Cardiomegaly: Secondary | ICD-10-CM | POA: Diagnosis not present

## 2020-09-16 DIAGNOSIS — Z8673 Personal history of transient ischemic attack (TIA), and cerebral infarction without residual deficits: Secondary | ICD-10-CM

## 2020-09-16 DIAGNOSIS — G4733 Obstructive sleep apnea (adult) (pediatric): Secondary | ICD-10-CM | POA: Diagnosis present

## 2020-09-16 DIAGNOSIS — E1169 Type 2 diabetes mellitus with other specified complication: Secondary | ICD-10-CM

## 2020-09-16 DIAGNOSIS — J9690 Respiratory failure, unspecified, unspecified whether with hypoxia or hypercapnia: Secondary | ICD-10-CM | POA: Diagnosis present

## 2020-09-16 DIAGNOSIS — N186 End stage renal disease: Secondary | ICD-10-CM | POA: Diagnosis present

## 2020-09-16 DIAGNOSIS — Z743 Need for continuous supervision: Secondary | ICD-10-CM | POA: Diagnosis not present

## 2020-09-16 DIAGNOSIS — R531 Weakness: Secondary | ICD-10-CM

## 2020-09-16 DIAGNOSIS — E1122 Type 2 diabetes mellitus with diabetic chronic kidney disease: Secondary | ICD-10-CM | POA: Insufficient documentation

## 2020-09-16 DIAGNOSIS — R4781 Slurred speech: Secondary | ICD-10-CM | POA: Diagnosis not present

## 2020-09-16 DIAGNOSIS — I4891 Unspecified atrial fibrillation: Secondary | ICD-10-CM | POA: Insufficient documentation

## 2020-09-16 DIAGNOSIS — I729 Aneurysm of unspecified site: Secondary | ICD-10-CM | POA: Insufficient documentation

## 2020-09-16 DIAGNOSIS — J45909 Unspecified asthma, uncomplicated: Secondary | ICD-10-CM | POA: Insufficient documentation

## 2020-09-16 DIAGNOSIS — N189 Chronic kidney disease, unspecified: Secondary | ICD-10-CM | POA: Diagnosis present

## 2020-09-16 DIAGNOSIS — I6381 Other cerebral infarction due to occlusion or stenosis of small artery: Secondary | ICD-10-CM | POA: Diagnosis not present

## 2020-09-16 DIAGNOSIS — Z951 Presence of aortocoronary bypass graft: Secondary | ICD-10-CM | POA: Insufficient documentation

## 2020-09-16 DIAGNOSIS — K219 Gastro-esophageal reflux disease without esophagitis: Secondary | ICD-10-CM | POA: Diagnosis present

## 2020-09-16 DIAGNOSIS — I639 Cerebral infarction, unspecified: Secondary | ICD-10-CM | POA: Diagnosis not present

## 2020-09-16 DIAGNOSIS — R29810 Facial weakness: Secondary | ICD-10-CM | POA: Diagnosis not present

## 2020-09-16 DIAGNOSIS — I25118 Atherosclerotic heart disease of native coronary artery with other forms of angina pectoris: Secondary | ICD-10-CM | POA: Diagnosis present

## 2020-09-16 DIAGNOSIS — Z7982 Long term (current) use of aspirin: Secondary | ICD-10-CM | POA: Insufficient documentation

## 2020-09-16 DIAGNOSIS — I160 Hypertensive urgency: Secondary | ICD-10-CM | POA: Diagnosis not present

## 2020-09-16 DIAGNOSIS — I5032 Chronic diastolic (congestive) heart failure: Secondary | ICD-10-CM | POA: Diagnosis present

## 2020-09-16 DIAGNOSIS — Z79899 Other long term (current) drug therapy: Secondary | ICD-10-CM | POA: Insufficient documentation

## 2020-09-16 DIAGNOSIS — Z87891 Personal history of nicotine dependence: Secondary | ICD-10-CM | POA: Diagnosis not present

## 2020-09-16 DIAGNOSIS — I10 Essential (primary) hypertension: Secondary | ICD-10-CM | POA: Diagnosis present

## 2020-09-16 DIAGNOSIS — I5042 Chronic combined systolic (congestive) and diastolic (congestive) heart failure: Secondary | ICD-10-CM | POA: Diagnosis not present

## 2020-09-16 DIAGNOSIS — N183 Chronic kidney disease, stage 3 unspecified: Secondary | ICD-10-CM | POA: Diagnosis present

## 2020-09-16 DIAGNOSIS — Z950 Presence of cardiac pacemaker: Secondary | ICD-10-CM | POA: Diagnosis present

## 2020-09-16 DIAGNOSIS — E119 Type 2 diabetes mellitus without complications: Secondary | ICD-10-CM

## 2020-09-16 LAB — I-STAT CHEM 8, ED
BUN: 23 mg/dL (ref 8–23)
Calcium, Ion: 1.07 mmol/L — ABNORMAL LOW (ref 1.15–1.40)
Chloride: 95 mmol/L — ABNORMAL LOW (ref 98–111)
Creatinine, Ser: 1.2 mg/dL — ABNORMAL HIGH (ref 0.44–1.00)
Glucose, Bld: 125 mg/dL — ABNORMAL HIGH (ref 70–99)
HCT: 44 % (ref 36.0–46.0)
Hemoglobin: 15 g/dL (ref 12.0–15.0)
Potassium: 4.6 mmol/L (ref 3.5–5.1)
Sodium: 139 mmol/L (ref 135–145)
TCO2: 40 mmol/L — ABNORMAL HIGH (ref 22–32)

## 2020-09-16 LAB — CBC WITH DIFFERENTIAL/PLATELET
Abs Immature Granulocytes: 0.05 10*3/uL (ref 0.00–0.07)
Basophils Absolute: 0.1 10*3/uL (ref 0.0–0.1)
Basophils Relative: 1 %
Eosinophils Absolute: 0.1 10*3/uL (ref 0.0–0.5)
Eosinophils Relative: 2 %
HCT: 40.3 % (ref 36.0–46.0)
Hemoglobin: 12 g/dL (ref 12.0–15.0)
Immature Granulocytes: 1 %
Lymphocytes Relative: 17 %
Lymphs Abs: 1 10*3/uL (ref 0.7–4.0)
MCH: 30 pg (ref 26.0–34.0)
MCHC: 29.8 g/dL — ABNORMAL LOW (ref 30.0–36.0)
MCV: 100.8 fL — ABNORMAL HIGH (ref 80.0–100.0)
Monocytes Absolute: 0.5 10*3/uL (ref 0.1–1.0)
Monocytes Relative: 8 %
Neutro Abs: 4.2 10*3/uL (ref 1.7–7.7)
Neutrophils Relative %: 71 %
Platelets: 104 10*3/uL — ABNORMAL LOW (ref 150–400)
RBC: 4 MIL/uL (ref 3.87–5.11)
RDW: 14 % (ref 11.5–15.5)
WBC: 5.9 10*3/uL (ref 4.0–10.5)
nRBC: 0 % (ref 0.0–0.2)

## 2020-09-16 LAB — DIFFERENTIAL
Abs Immature Granulocytes: 0.03 10*3/uL (ref 0.00–0.07)
Basophils Absolute: 0 10*3/uL (ref 0.0–0.1)
Basophils Relative: 0 %
Eosinophils Absolute: 0.1 10*3/uL (ref 0.0–0.5)
Eosinophils Relative: 2 %
Immature Granulocytes: 0 %
Lymphocytes Relative: 17 %
Lymphs Abs: 1.2 10*3/uL (ref 0.7–4.0)
Monocytes Absolute: 0.5 10*3/uL (ref 0.1–1.0)
Monocytes Relative: 8 %
Neutro Abs: 5.2 10*3/uL (ref 1.7–7.7)
Neutrophils Relative %: 73 %

## 2020-09-16 LAB — PROTIME-INR
INR: 1 (ref 0.8–1.2)
Prothrombin Time: 12.7 seconds (ref 11.4–15.2)

## 2020-09-16 LAB — APTT: aPTT: 29 seconds (ref 24–36)

## 2020-09-16 LAB — CBG MONITORING, ED
Glucose-Capillary: 122 mg/dL — ABNORMAL HIGH (ref 70–99)
Glucose-Capillary: 111 mg/dL — ABNORMAL HIGH (ref 70–99)

## 2020-09-16 LAB — COMPREHENSIVE METABOLIC PANEL
ALT: 12 U/L (ref 0–44)
AST: 19 U/L (ref 15–41)
Albumin: 3.1 g/dL — ABNORMAL LOW (ref 3.5–5.0)
Alkaline Phosphatase: 99 U/L (ref 38–126)
Anion gap: 6 (ref 5–15)
BUN: 18 mg/dL (ref 8–23)
CO2: 35 mmol/L — ABNORMAL HIGH (ref 22–32)
Calcium: 9.1 mg/dL (ref 8.9–10.3)
Chloride: 97 mmol/L — ABNORMAL LOW (ref 98–111)
Creatinine, Ser: 1.29 mg/dL — ABNORMAL HIGH (ref 0.44–1.00)
GFR, Estimated: 43 mL/min — ABNORMAL LOW (ref >60)
Glucose, Bld: 128 mg/dL — ABNORMAL HIGH (ref 70–99)
Potassium: 4.5 mmol/L (ref 3.5–5.1)
Sodium: 138 mmol/L (ref 135–145)
Total Bilirubin: 0.8 mg/dL (ref 0.3–1.2)
Total Protein: 6.4 g/dL — ABNORMAL LOW (ref 6.5–8.1)

## 2020-09-16 LAB — CBC
HCT: 43.3 % (ref 36.0–46.0)
Hemoglobin: 13.1 g/dL (ref 12.0–15.0)
MCH: 29.8 pg (ref 26.0–34.0)
MCHC: 30.3 g/dL (ref 30.0–36.0)
MCV: 98.6 fL (ref 80.0–100.0)
Platelets: 106 10*3/uL — ABNORMAL LOW (ref 150–400)
RBC: 4.39 MIL/uL (ref 3.87–5.11)
RDW: 14.1 % (ref 11.5–15.5)
WBC: 7.1 10*3/uL (ref 4.0–10.5)
nRBC: 0 % (ref 0.0–0.2)

## 2020-09-16 LAB — RESP PANEL BY RT-PCR (FLU A&B, COVID) ARPGX2
Influenza A by PCR: NEGATIVE
Influenza B by PCR: NEGATIVE
SARS Coronavirus 2 by RT PCR: NEGATIVE

## 2020-09-16 LAB — TROPONIN I (HIGH SENSITIVITY)
Troponin I (High Sensitivity): 74 ng/L — ABNORMAL HIGH (ref <18)
Troponin I (High Sensitivity): 20 ng/L — ABNORMAL HIGH (ref <18)

## 2020-09-16 MED ORDER — MOMETASONE FURO-FORMOTEROL FUM 200-5 MCG/ACT IN AERO
2.0000 | INHALATION_SPRAY | Freq: Two times a day (BID) | RESPIRATORY_TRACT | Status: DC
  Administered 2020-09-17 – 2020-09-19 (×6): 2 via RESPIRATORY_TRACT
  Filled 2020-09-16: qty 8.8

## 2020-09-16 MED ORDER — STROKE: EARLY STAGES OF RECOVERY BOOK
Freq: Once | Status: AC
  Filled 2020-09-16: qty 1

## 2020-09-16 MED ORDER — ALBUTEROL SULFATE HFA 108 (90 BASE) MCG/ACT IN AERS: 2.0000 | INHALATION_SPRAY | RESPIRATORY_TRACT | Status: DC | PRN

## 2020-09-16 MED ORDER — ALLOPURINOL 100 MG PO TABS
100.0000 mg | ORAL_TABLET | Freq: Two times a day (BID) | ORAL | Status: DC
  Administered 2020-09-16 – 2020-09-19 (×6): 100 mg via ORAL
  Filled 2020-09-16 (×6): qty 1

## 2020-09-16 MED ORDER — NITROGLYCERIN 0.4 MG SL SUBL: 0.4000 mg | SUBLINGUAL_TABLET | SUBLINGUAL | Status: DC | PRN

## 2020-09-16 MED ORDER — GUAIFENESIN ER 600 MG PO TB12
600.0000 mg | ORAL_TABLET | Freq: Two times a day (BID) | ORAL | Status: DC
  Administered 2020-09-16 – 2020-09-19 (×6): 600 mg via ORAL
  Filled 2020-09-16 (×6): qty 1

## 2020-09-16 MED ORDER — FERROUS SULFATE 325 (65 FE) MG PO TABS
325.0000 mg | ORAL_TABLET | Freq: Every day | ORAL | Status: DC
  Administered 2020-09-17 – 2020-09-19 (×3): 325 mg via ORAL
  Filled 2020-09-16 (×3): qty 1

## 2020-09-16 MED ORDER — ADULT MULTIVITAMIN W/MINERALS CH
1.0000 | ORAL_TABLET | Freq: Every morning | ORAL | Status: DC
  Administered 2020-09-17 – 2020-09-19 (×3): 1 via ORAL
  Filled 2020-09-16 (×3): qty 1

## 2020-09-16 MED ORDER — ROSUVASTATIN CALCIUM 20 MG PO TABS
20.0000 mg | ORAL_TABLET | Freq: Every evening | ORAL | Status: DC
  Administered 2020-09-16: 20 mg via ORAL
  Filled 2020-09-16: qty 1

## 2020-09-16 MED ORDER — CLOPIDOGREL BISULFATE 75 MG PO TABS
75.0000 mg | ORAL_TABLET | Freq: Every day | ORAL | Status: DC
  Administered 2020-09-16 – 2020-09-18 (×3): 75 mg via ORAL
  Filled 2020-09-16 (×3): qty 1

## 2020-09-16 MED ORDER — IPRATROPIUM BROMIDE 0.02 % IN SOLN: 0.5000 mg | Freq: Four times a day (QID) | RESPIRATORY_TRACT | Status: DC | PRN

## 2020-09-16 MED ORDER — TORSEMIDE 20 MG PO TABS
20.0000 mg | ORAL_TABLET | Freq: Two times a day (BID) | ORAL | Status: DC
  Administered 2020-09-17 – 2020-09-19 (×6): 20 mg via ORAL
  Filled 2020-09-16 (×7): qty 1

## 2020-09-16 MED ORDER — ISOSORBIDE MONONITRATE ER 60 MG PO TB24
90.0000 mg | ORAL_TABLET | Freq: Every day | ORAL | Status: DC
  Administered 2020-09-17 – 2020-09-19 (×3): 90 mg via ORAL
  Filled 2020-09-16: qty 3
  Filled 2020-09-16 (×2): qty 1

## 2020-09-16 MED ORDER — RANOLAZINE ER 500 MG PO TB12
1000.0000 mg | ORAL_TABLET | Freq: Two times a day (BID) | ORAL | Status: DC
  Administered 2020-09-17 – 2020-09-19 (×6): 1000 mg via ORAL
  Filled 2020-09-16 (×8): qty 2

## 2020-09-16 MED ORDER — ACETAMINOPHEN 325 MG PO TABS
650.0000 mg | ORAL_TABLET | ORAL | Status: DC | PRN
  Administered 2020-09-17 – 2020-09-19 (×3): 650 mg via ORAL
  Filled 2020-09-16 (×3): qty 2

## 2020-09-16 MED ORDER — PANTOPRAZOLE SODIUM 40 MG PO TBEC
40.0000 mg | DELAYED_RELEASE_TABLET | Freq: Two times a day (BID) | ORAL | Status: DC
  Administered 2020-09-16 – 2020-09-19 (×6): 40 mg via ORAL
  Filled 2020-09-16 (×6): qty 1

## 2020-09-16 MED ORDER — ASPIRIN EC 81 MG PO TBEC
81.0000 mg | DELAYED_RELEASE_TABLET | Freq: Every day | ORAL | Status: DC
  Administered 2020-09-17 – 2020-09-19 (×3): 81 mg via ORAL
  Filled 2020-09-16 (×3): qty 1

## 2020-09-16 MED ORDER — SODIUM CHLORIDE 0.9% FLUSH
3.0000 mL | Freq: Once | INTRAVENOUS | Status: AC
  Administered 2020-09-16: 3 mL via INTRAVENOUS

## 2020-09-16 MED ORDER — ENOXAPARIN SODIUM 40 MG/0.4ML IJ SOSY
40.0000 mg | PREFILLED_SYRINGE | Freq: Every day | INTRAMUSCULAR | Status: DC
  Administered 2020-09-16: 40 mg via SUBCUTANEOUS
  Filled 2020-09-16: qty 0.4

## 2020-09-16 MED ORDER — HYDRALAZINE HCL 20 MG/ML IJ SOLN: 10.0000 mg | Freq: Three times a day (TID) | INTRAMUSCULAR | Status: DC | PRN

## 2020-09-16 MED ORDER — POTASSIUM CHLORIDE CRYS ER 20 MEQ PO TBCR
20.0000 meq | EXTENDED_RELEASE_TABLET | Freq: Every morning | ORAL | Status: DC
  Administered 2020-09-17 – 2020-09-19 (×3): 20 meq via ORAL
  Filled 2020-09-16 (×3): qty 1

## 2020-09-16 MED ORDER — POLYVINYL ALCOHOL 1.4 % OP SOLN
1.0000 [drp] | Freq: Three times a day (TID) | OPHTHALMIC | Status: DC
  Administered 2020-09-17 – 2020-09-19 (×8): 1 [drp] via OPHTHALMIC
  Filled 2020-09-16 (×3): qty 15

## 2020-09-16 MED ORDER — SENNOSIDES-DOCUSATE SODIUM 8.6-50 MG PO TABS
1.0000 | ORAL_TABLET | Freq: Every evening | ORAL | Status: DC | PRN
  Administered 2020-09-18: 1 via ORAL
  Filled 2020-09-16: qty 1

## 2020-09-16 MED ORDER — ACETAMINOPHEN 160 MG/5ML PO SOLN: 650.0000 mg | ORAL | Status: DC | PRN

## 2020-09-16 MED ORDER — ALBUTEROL SULFATE (2.5 MG/3ML) 0.083% IN NEBU
2.5000 mg | INHALATION_SOLUTION | RESPIRATORY_TRACT | Status: DC | PRN
  Administered 2020-09-17 (×2): 2.5 mg via RESPIRATORY_TRACT
  Filled 2020-09-16 (×2): qty 3

## 2020-09-16 MED ORDER — EMPAGLIFLOZIN 10 MG PO TABS
10.0000 mg | ORAL_TABLET | Freq: Every day | ORAL | Status: DC
  Administered 2020-09-17 – 2020-09-19 (×3): 10 mg via ORAL
  Filled 2020-09-16 (×3): qty 1

## 2020-09-16 MED ORDER — ENOXAPARIN SODIUM 40 MG/0.4ML IJ SOSY
40.0000 mg | PREFILLED_SYRINGE | Freq: Every day | INTRAMUSCULAR | Status: DC
  Administered 2020-09-17 – 2020-09-18 (×2): 40 mg via SUBCUTANEOUS
  Filled 2020-09-16 (×2): qty 0.4

## 2020-09-16 MED ORDER — CYCLOSPORINE 0.05 % OP EMUL
1.0000 [drp] | Freq: Two times a day (BID) | OPHTHALMIC | Status: DC
  Administered 2020-09-17 – 2020-09-19 (×6): 1 [drp] via OPHTHALMIC
  Filled 2020-09-16 (×8): qty 1

## 2020-09-16 MED ORDER — LORATADINE 10 MG PO TABS
10.0000 mg | ORAL_TABLET | Freq: Every day | ORAL | Status: DC
  Administered 2020-09-17 – 2020-09-19 (×3): 10 mg via ORAL
  Filled 2020-09-16 (×3): qty 1

## 2020-09-16 MED ORDER — INSULIN ASPART 100 UNIT/ML IJ SOLN
0.0000 [IU] | Freq: Three times a day (TID) | INTRAMUSCULAR | Status: DC
  Administered 2020-09-18 (×2): 2 [IU] via SUBCUTANEOUS
  Administered 2020-09-18: 5 [IU] via SUBCUTANEOUS
  Administered 2020-09-19: 1 [IU] via SUBCUTANEOUS

## 2020-09-16 MED ORDER — ACETAMINOPHEN 650 MG RE SUPP: 650.0000 mg | RECTAL | Status: DC | PRN

## 2020-09-16 MED ORDER — POLYETHYLENE GLYCOL 3350 17 G PO PACK
17.0000 g | PACK | Freq: Every day | ORAL | Status: DC | PRN
  Administered 2020-09-18 – 2020-09-19 (×2): 17 g via ORAL
  Filled 2020-09-16 (×2): qty 1

## 2020-09-16 MED ORDER — FLUTICASONE PROPIONATE 50 MCG/ACT NA SUSP
1.0000 | Freq: Every day | NASAL | Status: DC
  Administered 2020-09-17 – 2020-09-19 (×3): 1 via NASAL
  Filled 2020-09-16: qty 16

## 2020-09-16 NOTE — Consult Note (Signed)
NEUROLOGY CONSULTATION NOTE   Date of service: September 16, 2020 Patient Name: [name] MRN:  [id number] DOB:  1946-01-06 Reason for consult: "Stroke code" Requesting Provider: Fredia Sorrow, MD _ _ _   _ __   _ __ _ _  __ __   _ __   __ _  History of Present Illness  [name] is a 75 y.o. female with PMH significant for cardiomyopathy status post AICD placement, prior TIAs, diabetes, hypercholesterolemia, hypertension, obstructive sleep apnea who woke up at 0700 and 09/16/2020 and was at her baseline.  At 0900, she had abrupt onset left lower extremity weakness.  When her symptoms did not improve, she eventually called EMS.  Per EMS, she was noted to have a facial droop, aphasia, funny feeling and slurring of her speech.  Patient denied all of these symptoms to me and just endorsed left leg weakness.  CT head without contrast with no acute abnormality and no intracranial hemorrhage. Does not smoke. Endorses prior TIAs.  mRS: 1 tPA: outside window at presentation Thrombectomy: Low NIHSS, low concern for an LVO. History of anaphylaxis to iodinated contrast so unable to get CT Angio. NIHSS components Score: Comment  1a Level of Conscious 0[x]  1[]  2[]  3[]      1b LOC Questions 0[x]  1[]  2[]       1c LOC Commands 0[x]  1[]  2[]       2 Best Gaze 0[x]  1[]  2[]       3 Visual 0[x]  1[]  2[]  3[]      4 Facial Palsy 0[x]  1[]  2[]  3[]      5a Motor Arm - left 0[x]  1[]  2[]  3[]  4[]  UN[]    5b Motor Arm - Right 0[x]  1[]  2[]  3[]  4[]  UN[]    6a Motor Leg - Left 0[]  1[]  2[x]  3[]  4[]  UN[]    6b Motor Leg - Right 0[x]  1[]  2[]  3[]  4[]  UN[]    7 Limb Ataxia 0[x]  1[]  2[]  3[]  UN[]     8 Sensory 0[x]  1[]  2[]  UN[]      9 Best Language 0[x]  1[]  2[]  3[]      10 Dysarthria 0[]  1[]  2[]  UN[]      11 Extinct. and Inattention 0[x]  1[]  2[]       TOTAL: 2      ROS   Constitutional Denies weight loss, fever and chills.   HEENT Denies changes in vision and hearing.   Respiratory Denies SOB and cough.   CV Denies  palpitations and CP   GI Denies abdominal pain, nausea, vomiting and diarrhea.   GU Denies dysuria and urinary frequency.   MSK Denies myalgia and joint pain.   Skin Denies rash and pruritus.   Neurological Denies headache and syncope.   Psychiatric Denies recent changes in mood. Denies anxiety and depression.    Past History   Past Medical History:  Diagnosis Date   ABDOMINAL PAIN -GENERALIZED 02/21/2010   Qualifier: Diagnosis of  By: Chester Holstein NP, Nevin Bloodgood     AICD (automatic cardioverter/defibrillator) present    Anemia    Arthritis    Asthma    Atrial fibrillation ([place]) 10/24/2015   Questionable history of A Fib/A Flutter. On device interrogation on 9/7, showed 0.86min of A Fib/A Flutter with 1% burden.  Device check in Jan 2018 showed no sustained AF (runs of less than 30 seconds). No anticoagulation indicated.   CAD (coronary artery disease)    a. 10/09/16 LHC: SVG->LAD patent, SVG->Diag patent, SVG->RCA patent, SVG->LCx occluded. EF 60%, b. 10/31/16 LHC DES to AV groove Circ, DES  to intermed branch   Cellulitis and abscess of foot 12/2014   RT FOOT   CHF (congestive heart failure) (HCC)    Chronic bronchitis (HCC)    Chronic lower back pain    Complete heart block ([place]) 06/22/2015   Diverticulosis    Facial numbness 02/2016   Fatty liver disease, nonalcoholic    Gastritis    GERD (gastroesophageal reflux disease)    Gout    H/O hiatal hernia    HEMORRHOIDS-EXTERNAL 02/21/2010   Qualifier: Diagnosis of  By: Chester Holstein NP, Paula     High cholesterol    Hyperplastic colon polyp    Hypertension    IBS (irritable bowel syndrome)    Internal hemorrhoids    INTERNAL HEMORRHOIDS WITHOUT MENTION COMP 04/12/2007   Qualifier: Diagnosis of  By: Olevia Perches MD, Lowella Bandy    Ischemic cardiomyopathy    Nonischemic cardiomyopathy ([place]) 11/22/2011   Pt responded to BiV ICD- last EF 55-60% Sept 2017   Obesity    On home oxygen therapy    "2L at night" (10/31/2016)   OSA (obstructive sleep apnea)     "can't tolerate a mask" (10/30/2016)   PERSONAL HX COLONIC POLYPS 02/21/2010   Qualifier: Diagnosis of  By: Chester Holstein NP, Paula     Pneumonia    "couple times" (10/31/2016)   Right facial numbness 03/05/2016   Shortness of breath    TIA (transient ischemic attack)    "recently" (10/31/2016)   Type II diabetes mellitus ([place])    Past Surgical History:  Procedure Laterality Date   ABDOMINAL ULTRASOUND  12/01/2011   Peripelvic cysts- #1- 2.4x1.9x2.3cm, #2-1.2x0.9x1.2cm   ANKLE FRACTURE SURGERY Right    "had rod put in"   ANTERIOR CERVICAL DECOMP/DISCECTOMY FUSION     APPENDECTOMY     BACK SURGERY     BIOPSY  12/29/2017   Procedure: BIOPSY;  Surgeon: [name], MD;  Location: [facility];  Service: Endoscopy;;   BIV ICD INSERTION CRT-D  2001?   BIV PACEMAKER GENERATOR CHANGE OUT N/A 11/02/2012   Procedure: BIV PACEMAKER GENERATOR CHANGE OUT;  Surgeon: Sanda Klein, MD;  Location: [facility];  Service: Cardiovascular;  Laterality: N/A;   CARDIAC CATHETERIZATION  05/17/1999   No significant coronary obstructive disease w/ mild 20% luminal irregularity of the first diag branch of the LAD   CARDIAC CATHETERIZATION  07/08/2002   No significant CAD, moderately depressed LV systolic function   CARDIAC CATHETERIZATION Bilateral 04/26/2007   Normal findings, recommend medical therapy   CARDIAC CATHETERIZATION  02/18/2008   Moderate CAD, would benefit from having a functional study, recommend continue medical therapy   CARDIAC CATHETERIZATION  07/23/2012   Medical therapy   CARDIAC CATHETERIZATION N/A 11/24/2014   Procedure: Left Heart Cath and Coronary Angiography;  Surgeon: Troy Sine, MD;  Location: [facility];  Service: Cardiovascular;  Laterality: N/A;   CARDIAC CATHETERIZATION  11/27/2014   Procedure: Intravascular Pressure Wire/FFR Study;  Surgeon: Peter M Martinique, MD;  Location: [facility];  Service: Cardiovascular;;   CARDIAC CATHETERIZATION  10/09/2016    CHOLECYSTECTOMY N/A 04/09/2018   Procedure: LAPAROSCOPIC CHOLECYSTECTOMY;  Surgeon: Greer Pickerel, MD;  Location: [facility];  Service: General;  Laterality: N/A;   COLONOSCOPY WITH PROPOFOL N/A 12/29/2017   Procedure: COLONOSCOPY WITH PROPOFOL;  Surgeon: [name], MD;  Location: [facility];  Service: Endoscopy;  Laterality: N/A;   CORONARY ANGIOGRAM  2010   CORONARY ARTERY BYPASS GRAFT N/A 11/29/2014   Procedure: CORONARY  ARTERY BYPASS GRAFTING x 5 (LIMA-LAD, SVG-D, SVG-OM1-OM2, SVG-PD);  Surgeon: Melrose Nakayama, MD;  Location: [facility];  Service: Open Heart Surgery;  Laterality: N/A;   CORONARY STENT INTERVENTION N/A 10/31/2016   Procedure: CORONARY STENT INTERVENTION;  Surgeon: Burnell Blanks, MD;  Location: [facility];  Service: Cardiovascular;  Laterality: N/A;   ESOPHAGOGASTRODUODENOSCOPY (EGD) WITH PROPOFOL N/A 12/29/2017   Procedure: ESOPHAGOGASTRODUODENOSCOPY (EGD) WITH PROPOFOL;  Surgeon: [name], MD;  [facility];  Service: Endoscopy;  Laterality: N/A;   FRACTURE SURGERY     INSERT / REPLACE / [place] ARTHROSCOPY Bilateral    LEFT HEART CATH AND CORS/GRAFTS ANGIOGRAPHY N/A 12/09/2017   Procedure: LEFT HEART CATH AND CORS/GRAFTS ANGIOGRAPHY;  Surgeon: Troy Sine, MD;  Location: [facility];  Service: Cardiovascular;  Laterality: N/A;   LEFT HEART CATHETERIZATION WITH CORONARY ANGIOGRAM N/A 07/23/2012   Procedure: LEFT HEART CATHETERIZATION WITH CORONARY ANGIOGRAM;  Surgeon: Leonie Man, MD;  Location: [facility];  Service: Cardiovascular;  Laterality: N/A;   LEXISCAN MYOVIEW  11/14/2011   Mild-moderate defect seen in Mid Inferolateral and Mid Anterolateral regions-consistant w/ infarct/scar. No significant ischemia demonstrated.   POLYPECTOMY  12/29/2017   Procedure: POLYPECTOMY;  Surgeon: [name], MD;  [facility];  Service: Endoscopy;;   RIGHT/LEFT HEART CATH AND CORONARY  ANGIOGRAPHY N/A 10/09/2016   Procedure: RIGHT/LEFT HEART CATH AND CORONARY ANGIOGRAPHY;  Surgeon: Jolaine Artist, MD;  Location: [facility];  Service: Cardiovascular;  Laterality: N/A;   RIGHT/LEFT HEART CATH AND CORONARY/GRAFT ANGIOGRAPHY N/A 03/11/2019   Procedure: RIGHT/LEFT HEART CATH AND CORONARY/GRAFT ANGIOGRAPHY;  Surgeon: Jolaine Artist, MD;  Location: [facility];  Service: Cardiovascular;  Laterality: N/A;   TEE WITHOUT CARDIOVERSION N/A 11/29/2014   Procedure: TRANSESOPHAGEAL ECHOCARDIOGRAM (TEE);  Surgeon: Melrose Nakayama, MD;  Location: [facility];  Service: Open Heart Surgery;  Laterality: N/A;   TRANSTHORACIC ECHOCARDIOGRAM  07/23/2012   EF 55-60%, normal-mild   TUBAL LIGATION     Family History  Problem Relation Age of Onset   Breast cancer Mother    Diabetes Mother    Heart disease Maternal Grandmother    Kidney disease Maternal Grandmother    Diabetes Maternal Grandmother    Glaucoma Maternal Aunt    Heart disease Maternal Aunt    Asthma Sister    Colon cancer Neg Hx    Stomach cancer Neg Hx    Pancreatic cancer Neg Hx    Social History   Socioeconomic History   Marital status: Widowed    Spouse name: Not on file   Number of children: 5   Years of education: Not on file   Highest education level: Not on file  Occupational History   Occupation: STAFF/BUFFET    Employer: [employer]  Tobacco Use   Smoking status: Former    Packs/day: 0.25    Years: 3.00    Pack years: 0.75    Types: Cigarettes    Quit date: 02/11/1968    Years since quitting: 52.6   Smokeless tobacco: Never  Vaping Use   Vaping Use: Never used  Substance and Sexual Activity   Alcohol use: No   Drug use: No   Sexual activity: Never  Other Topics Concern   Not on file  Social History Narrative   Widowed last year. She lives with her two sons.   Social Determinants of Radio broadcast assistant  Strain: Not on file  Food Insecurity: Not on file   Transportation Needs: Not on file  Physical Activity: Not on file  Stress: Not on file  Social Connections: Not on file   Allergies  Allergen Reactions   Ivp Dye [Iodinated Diagnostic Agents] Shortness Of Breath    No reaction to PO contrast with non-ionic dye.06-25-2014/rsm   Shellfish Allergy Anaphylaxis   Sulfa Antibiotics Shortness Of Breath   Iodine Hives   Atorvastatin Other (See Comments)    Pt states "causes bilateral leg pain/cramps."   Benadryl [Diphenhydramine] Other (See Comments)    "THIS DRIVES ME CRAZY AND MAKES ME FEEL LIKE I AM DYING"   Colchicine Nausea And Vomiting   Contrast Media [Iodinated Diagnostic Agents] Hives   Doxycycline Other (See Comments)    Unknown reaction, per patient   Uloric [Febuxostat] Other (See Comments)    Unknown reaction   Cephalexin Itching and Rash   Zithromax [Azithromycin] Rash    Medications  (Not in a hospital admission)    Vitals   Vitals:   09/16/20 1530 09/16/20 1545 09/16/20 1600 09/16/20 1615  BP: (!) 173/85 (!) 170/86 (!) 147/68 (!) 160/88  Pulse: 68 65 70 65  Resp: 11   11  Temp:      TempSrc:      SpO2: 98% 99% 100% 98%     There is no height or weight on file to calculate BMI.  Physical Exam   General: Laying comfortably in bed; in no acute distress.  HENT: Normal oropharynx and mucosa. Normal external appearance of ears and nose.  Neck: Supple, no pain or tenderness  CV: No JVD. No peripheral edema.  Pulmonary: Symmetric Chest rise. Normal respiratory effort.  Abdomen: Soft to touch, non-tender.  Ext: No cyanosis, edema, or deformity  Skin: No rash. Normal palpation of skin.   Musculoskeletal: Normal digits and nails by inspection. No clubbing.   Neurologic Examination  Mental status/Cognition: Alert, oriented to self, place, month and year, good attention.  Speech/language: Fluent, comprehension intact, object naming intact, repetition intact.  Cranial nerves:   CN II Pupils equal and reactive to  light, no VF deficits    CN III,IV,VI EOM intact, no gaze preference or deviation, no nystagmus    CN V normal sensation in V1, V2, and V3 segments bilaterally    CN VII no asymmetry, no nasolabial fold flattening    CN VIII normal hearing to speech    CN IX & X normal palatal elevation, no uvular deviation    CN XI 5/5 head turn and 5/5 shoulder shrug bilaterally    CN XII midline tongue protrusion    Motor:  Muscle bulk: poor, tone normal, pronator drift none tremor none Mvmt Root Nerve  Muscle Right Left Comments  SA C5/6 Ax Deltoid 5 5   EF C5/6 Mc Biceps 5 5   EE C6/7/8 Rad Triceps 5 5   WF C6/7 Med FCR     WE C7/8 PIN ECU     F Ab C8/T1 U ADM/FDI 5 5   HF L1/2/3 Fem Illopsoas 5 3   KE L2/3/4 Fem Quad 5 3   DF L4/5 D Peron Tib Ant 5 3   PF S1/2 Tibial Grc/Sol 5 3    Reflexes:  Right Left Comments  Pectoralis      Biceps (C5/6) 2 2   Brachioradialis (C5/6) 2 2    Triceps (C6/7) 2 2    Patellar (L3/4) 2 2  Achilles (S1)      Hoffman      Plantar     Jaw jerk    Sensation:  Light touch Intact throughout   Pin prick    Temperature    Vibration   Proprioception    Coordination/Complex Motor:  - Finger to Nose intact BL - Heel to shin unable to do. - Rapid alternating movement are normal - Gait: deferred.  Labs   CBC:  Recent Labs  Lab 09/16/20 1441 09/16/20 1447  WBC 7.1  --   NEUTROABS 5.2  --   HGB 13.1 15.0  HCT 43.3 44.0  MCV 98.6  --   PLT 106*  --     Basic Metabolic Panel:  Lab Results  Component Value Date   NA 139 09/16/2020   K 4.6 09/16/2020   CO2 35 (H) 09/16/2020   GLUCOSE 125 (H) 09/16/2020   BUN 23 09/16/2020   CREATININE 1.20 (H) 09/16/2020   CALCIUM 9.1 09/16/2020   GFRNONAA 43 (L) 09/16/2020   GFRAA 25 (L) 06/02/2019   Lipid Panel:  Lab Results  Component Value Date   LDLCALC 99 01/21/2019   HgbA1c:  Lab Results  Component Value Date   HGBA1C 7.1 (H) 06/01/2019   Urine Drug Screen: No results found for: LABOPIA,  COCAINSCRNUR, LABBENZ, AMPHETMU, THCU, LABBARB  Alcohol Level No results found for: ETH  CT Head without contrast: CTH was negative for a large hypodensity concerning for a large territory infarct or hyperdensity concerning for an ICH  CT angio Head and Neck with contrast: pending  MRI Brain: Unable to obtain due to AICD Impression   [name] is a 75 y.o. female with PMH significant for cardiomyopathy status post AICD placement, prior TIAs, diabetes, hypercholesterolemia, hypertension, obstructive sleep apnea who presents with L lower ext weakness. Outside tPA window. Thrombectomy not offered due to low concern for LVO given only LLE weakness and no other cortical signs at presentation and low NIHSS.  Primary Diagnosis:  Other cerebral infarction due to occlusion of stenosis of small artery.  Secondary Diagnosis: Essential (primary) hypertension, Chronic systolic (congestive) heart failure, Type 2 diabetes mellitus w/o complications, and Morbid Obesity(BMI > 40)  Recommendations  Plan:  - Frequent Neuro checks per stroke unit protocol - Recommend Vascular imaging with Transcranial dopplers and Vasc US carotid duplex. - Repeat CTH without contrast in 24 hours. - Recommend obtaining TTE - Recommend obtaining Lipid panel with LDL - Please start statin if LDL > 70 - Recommend HbA1c - Antithrombotic - continue home aspirin and plavix. - Recommend DVT ppx - SBP goal - permissive hypertension first 24 h < 220/110. Held home meds.  - Recommend Telemetry monitoring for arrythmia - Recommend bedside swallow screen prior to PO intake. - Stroke education booklet - Recommend PT/OT/SLP consult. ______________________________________________________________________  Thank you for the opportunity to take part in the care of this patient. If you have any further questions, please contact the neurology consultation attending.  Signed,  [place] Pager Number [phone] _ _ _   _ __   _ __ _ _  __ __   _ __   __ _

## 2020-09-16 NOTE — ED Triage Notes (Signed)
Pt BIB [facility] as Code Stroke. LKN 0700 & family reports noticing stroke symptoms at 0900. EMS reports family stated they noticed pt had Lt facial droop, aphasia, "funny feeling" & a slurred speech. Upon EMS arrival she was noted to have Lt leg weakness. A/Ox4, GCS 15, verbal- able to make needs known. 150/106 68 bpm 20 R 100% 3L O2 CBG 134

## 2020-09-16 NOTE — ED Provider Notes (Addendum)
I provided a substantive portion of the care of this patient.  I personally performed the entirety of the history, exam, and medical decision making for this encounter.  CRITICAL CARE Performed by: Fredia Sorrow Total critical care time: 35 minutes Critical care time was exclusive of separately billable procedures and treating other patients. Critical care was necessary to treat or prevent imminent or life-threatening deterioration. Critical care was time spent personally by me on the following activities: development of treatment plan with patient and/or surrogate as well as nursing, discussions with consultants, evaluation of patient's response to treatment, examination of patient, obtaining history from patient or surrogate, ordering and performing treatments and interventions, ordering and review of laboratory studies, ordering and review of radiographic studies, pulse oximetry and re-evaluation of patient's condition.  EKG Interpretation  Date/Time:  Sunday September 16 2020 14:54:38 EDT Ventricular Rate:  67 PR Interval:  162 QRS Duration: 172 QT Interval:  499 QTC Calculation: 527 R Axis:   -60 Text Interpretation: Atrial sensed ventricular paced Nonspecific IVCD with LAD Left ventricular hypertrophy Abnrm T, consider ischemia, anterolateral lds Confirmed by Fredia Sorrow [phone]) on 09/16/2020 3:24:10 PM  Patient arrived as a code stroke.  Patient's work-up complicated by the fact that she has a pacemaker.  And has a dye allergy.  Patient seen by me along with physician assistant.  Reported last known normal was 7:00 this morning.  Patient noted while she was looking in the mirror that she had some left side of her face seemed to be drooping compared to the right.  Patient also thought there was some mild left-sided that weakness more so in her left leg.  And she has had a headache for the past few days.  Patient also complained of some chest pain.  No significant band symptoms.  Based  on this timing patient was not a tPA candidate.  Head CT without any acute findings.  Patient's initial troponin was elevated at 75 delta troponin pending.  Neurology recommended admission to hospitalist service.  And they will repeat the head CT in the morning.  Also hospital service will need to follow-up on the troponins.  To see if there is any continued elevation.  But patient did states she is at the chest pain actually for several days.  Exam significant for the fact that the patient is alert and has clear speech.  No difficulty speaking.  Some drift in the left upper extremity left lower extremity.  Intact finger-to-nose bilaterally.  Questionable facial droop on the left side.     Patient was seen by the code stroke team.         Fredia Sorrow, MD 09/16/20 1727    Fredia Sorrow, MD 09/16/20 [phone]

## 2020-09-16 NOTE — ED Provider Notes (Signed)
[facility] EMERGENCY DEPARTMENT Provider Note   CSN: [id number] Arrival date & time: 09/16/20  1431     History Chief Complaint  Patient presents with   Code Stroke    [name] is a 75 y.o. female with multiple medical comorbidities including TIA, T2DM, CHF, complete heart block S/p pacemaker placement ischemic cardiomyopathy, CAD S/p CABG, hypercholesterolemia, hypertension, & chronic respiratory failure on O2 at baseline who presents to the ED as a code stroke.   In terms of code stroke- last known normal 0700AM. While she was looking in the mirror putting on makeup this AM she noted that the left side of her face seemed to be drooping compared to the right, this has persisted since onset, she has also had some left sided weakness which may have started this morning or yesterday.  She also notes that she has had intermittent headaches for the past few days, some sinus, some posterior, gradual onset with steady progression each time, has had somewhat similar in the past.Had an episode of lightheadedness earlier today, no dizziness like the room spinning. Denies diplopia, loss of vision, syncope, or numbness. Per EMS family felt patient seemed to have some slurred speech, patient reports no change in speech.   She also mentions intermittent chest pain for the past few weeks, lasts a few minutes at a time, central, non-radiating, occurs more so with activity and dyspnea, improves with rest and nitroglycerin. Denies N/V, diaphoresis, dyspnea, syncope, or hemoptysis. No pain @ present.   HPI     Past Medical History:  Diagnosis Date   ABDOMINAL PAIN -GENERALIZED 02/21/2010   Qualifier: Diagnosis of  By: Chester Holstein NP, Nevin Bloodgood     AICD (automatic cardioverter/defibrillator) present    Anemia    Arthritis    Asthma    Atrial fibrillation ([place]) 10/24/2015   Questionable history of A Fib/A Flutter. On device interrogation on 9/7, showed 0.43min of A Fib/A Flutter with 1%  burden.  Device check in Jan 2018 showed no sustained AF (runs of less than 30 seconds). No anticoagulation indicated.   CAD (coronary artery disease)    a. 10/09/16 LHC: SVG->LAD patent, SVG->Diag patent, SVG->RCA patent, SVG->LCx occluded. EF 60%, b. 10/31/16 LHC DES to AV groove Circ, DES to intermed branch   Cellulitis and abscess of foot 12/2014   RT FOOT   CHF (congestive heart failure) (HCC)    Chronic bronchitis (HCC)    Chronic lower back pain    Complete heart block ([place]) 06/22/2015   Diverticulosis    Facial numbness 02/2016   Fatty liver disease, nonalcoholic    Gastritis    GERD (gastroesophageal reflux disease)    Gout    H/O hiatal hernia    HEMORRHOIDS-EXTERNAL 02/21/2010   Qualifier: Diagnosis of  By: Chester Holstein NP, Paula     High cholesterol    Hyperplastic colon polyp    Hypertension    IBS (irritable bowel syndrome)    Internal hemorrhoids    INTERNAL HEMORRHOIDS WITHOUT MENTION COMP 04/12/2007   Qualifier: Diagnosis of  By: Olevia Perches MD, Lowella Bandy    Ischemic cardiomyopathy    Nonischemic cardiomyopathy ([place]) 11/22/2011   Pt responded to BiV ICD- last EF 55-60% Sept 2017   Obesity    On home oxygen therapy    "2L at night" (10/31/2016)   OSA (obstructive sleep apnea)    "can't tolerate a mask" (10/30/2016)   PERSONAL HX COLONIC POLYPS 02/21/2010   Qualifier: Diagnosis of  By: Chester Holstein  NP, Paula     Pneumonia    "couple times" (10/31/2016)   Right facial numbness 03/05/2016   Shortness of breath    TIA (transient ischemic attack)    "recently" (10/31/2016)   Type II diabetes mellitus ([place])     Patient Active Problem List   Diagnosis Date Noted   Blood blister 08/01/2020   Elevated troponin 06/01/2019   Constipation 10/21/2018   Chronic headaches 07/30/2018   Congestive heart failure (CHF) ([place]) 07/30/2018   Supplemental oxygen dependent 07/30/2018   Leukopenia 05/03/2018   Thrombocytopenia ([place]) 05/03/2018   Pneumonia due to human metapneumovirus 05/02/2018    Chest pain 05/01/2018   Allergic rhinitis 04/13/2018   S/P laparoscopic cholecystectomy 04/09/2018   Chronic respiratory failure with hypoxia and hypercapnia ([place]) 03/07/2018   Polyp of cecum    Polyp of transverse colon    Positive colorectal cancer screening using Cologuard test    Dysphagia    Acute on chronic renal failure ([place]) 11/20/2017   CAD (coronary artery disease) 11/20/2017   DM2 (diabetes mellitus, type 2) ([place]) 11/20/2017   Abdominal pain, epigastric 11/20/2017   Vasomotor rhinitis 10/16/2016   Right facial numbness 03/05/2016   CKD (chronic kidney disease), stage III ([place]) 03/05/2016   Atrial fibrillation ([place]) 10/24/2015   Chest pain with moderate risk of acute coronary syndrome 10/23/2015   History of TIA (transient ischemic attack) 10/22/2015   Complete heart block ([place]) 06/22/2015   Allergic drug rash due to anti-infective agent 04/29/2015   Fatigue 02/14/2015   Chronic diastolic CHF (congestive heart failure) ([place]) 02/14/2015   Cellulitis 12/21/2014   S/P CABG x 5 11/29/2014   CAD S/P percutaneous coronary angioplasty    Diarrhea 07/12/2014   Generalized abdominal pain 16/11/9602   Diastolic CHF, acute on chronic (HCC) 11/04/2013   Mixed hypercholesterolemia and hypertriglyceridemia 04/22/2013   Vertigo 04/22/2013   Dyspnea on exertion 08/25/2012   Allergic to IV contrast 07/23/2012   Unstable angina ([place]) 07/22/2012   Morbid (severe) obesity due to excess calories ([place]) 11/22/2011   Gastroesophageal reflux disease 11/22/2011   Back pain 11/22/2011   OSA (obstructive sleep apnea) 11/22/2011   HEMORRHOIDS-EXTERNAL 02/21/2010   NAUSEA 02/21/2010   ABDOMINAL PAIN -GENERALIZED 02/21/2010   PERSONAL HX COLONIC POLYPS 02/21/2010   ANEMIA 04/16/2007   Essential hypertension 04/16/2007   DIVERTICULOSIS, COLON 04/16/2007   ARTHRITIS 04/16/2007   INTERNAL HEMORRHOIDS WITHOUT MENTION COMP 04/12/2007   Asthma 04/12/2007   Cardiac resynchronization therapy  pacemaker (CRT-P) in place 04/12/2007   Gastritis without bleeding 12/14/2006   COLONIC POLYPS, HYPERPLASTIC 09/27/2002   FATTY LIVER DISEASE 02/16/2002    Past Surgical History:  Procedure Laterality Date   ABDOMINAL ULTRASOUND  12/01/2011   Peripelvic cysts- #1- 2.4x1.9x2.3cm, #2-1.2x0.9x1.2cm   ANKLE FRACTURE SURGERY Right    "had rod put in"   ANTERIOR CERVICAL DECOMP/DISCECTOMY FUSION     APPENDECTOMY     BACK SURGERY     BIOPSY  12/29/2017   Procedure: BIOPSY;  Surgeon: [name], MD;  Location: [facility];  Service: Endoscopy;;   BIV ICD INSERTION CRT-D  2001?   BIV PACEMAKER GENERATOR CHANGE OUT N/A 11/02/2012   Procedure: BIV PACEMAKER GENERATOR CHANGE OUT;  Surgeon: Sanda Klein, MD;  Location: [facility] C[facility] LAB;  Service: Cardiovascular;  Laterality: N/A;   CARDIAC CATHETERIZATION  05/17/1999   No significant coronary obstructive disease w/ mild 20% luminal irregularity of the first diag branch of the LAD   CARDIAC CATHETERIZATION  07/08/2002  No significant CAD, moderately depressed LV systolic function   CARDIAC CATHETERIZATION Bilateral 04/26/2007   Normal findings, recommend medical therapy   CARDIAC CATHETERIZATION  02/18/2008   Moderate CAD, would benefit from having a functional study, recommend continue medical therapy   CARDIAC CATHETERIZATION  07/23/2012   Medical therapy   CARDIAC CATHETERIZATION N/A 11/24/2014   Procedure: Left Heart Cath and Coronary Angiography;  Surgeon: Troy Sine, MD;  Location: [facility] CV LAB;  Service: Cardiovascular;  Laterality: N/A;   CARDIAC CATHETERIZATION  11/27/2014   Procedure: Intravascular Pressure Wire/FFR Study;  Surgeon: Peter M Martinique, MD;  Location: [facility] CV LAB;  Service: Cardiovascular;;   CARDIAC CATHETERIZATION  10/09/2016   CHOLECYSTECTOMY N/A 04/09/2018   Procedure: LAPAROSCOPIC CHOLECYSTECTOMY;  Surgeon: Greer Pickerel, MD;  Location: [facility];  Service: General;  Laterality: N/A;   COLONOSCOPY WITH  PROPOFOL N/A 12/29/2017   Procedure: COLONOSCOPY WITH PROPOFOL;  Surgeon: [name], MD;  [facility];  Service: Endoscopy;  Laterality: N/A;   CORONARY ANGIOGRAM  2010   CORONARY ARTERY BYPASS GRAFT N/A 11/29/2014   Procedure: CORONARY ARTERY BYPASS GRAFTING x 5 (LIMA-LAD, SVG-D, SVG-OM1-OM2, SVG-PD);  Surgeon: Melrose Nakayama, MD;  Location: [facility];  Service: Open Heart Surgery;  Laterality: N/A;   CORONARY STENT INTERVENTION N/A 10/31/2016   Procedure: CORONARY STENT INTERVENTION;  Surgeon: Burnell Blanks, MD;  Location: [facility] CV LAB;  Service: Cardiovascular;  Laterality: N/A;   ESOPHAGOGASTRODUODENOSCOPY (EGD) WITH PROPOFOL N/A 12/29/2017   Procedure: ESOPHAGOGASTRODUODENOSCOPY (EGD) WITH PROPOFOL;  Surgeon: [name], MD;  [facility];  Service: Endoscopy;  Laterality: N/A;   FRACTURE SURGERY     INSERT / REPLACE / [place] ARTHROSCOPY Bilateral    LEFT HEART CATH AND CORS/GRAFTS ANGIOGRAPHY N/A 12/09/2017   Procedure: LEFT HEART CATH AND CORS/GRAFTS ANGIOGRAPHY;  Surgeon: Troy Sine, MD;  Location: [facility] LAB;  Service: Cardiovascular;  Laterality: N/A;   LEFT HEART CATHETERIZATION WITH CORONARY ANGIOGRAM N/A 07/23/2012   Procedure: LEFT HEART CATHETERIZATION WITH CORONARY ANGIOGRAM;  Surgeon: Leonie Man, MD;  Location: [facility] CATH LAB;  Service: Cardiovascular;  Laterality: N/A;   LEXISCAN MYOVIEW  11/14/2011   Mild-moderate defect seen in Mid Inferolateral and Mid Anterolateral regions-consistant w/ infarct/scar. No significant ischemia demonstrated.   POLYPECTOMY  12/29/2017   Procedure: POLYPECTOMY;  Surgeon: [name], MD;  [facility];  Service: Endoscopy;;   RIGHT/LEFT HEART CATH AND CORONARY ANGIOGRAPHY N/A 10/09/2016   Procedure: RIGHT/LEFT HEART CATH AND CORONARY ANGIOGRAPHY;  Surgeon: Jolaine Artist, MD;  Location: [facility] CV LAB;  Service: Cardiovascular;   Laterality: N/A;   RIGHT/LEFT HEART CATH AND CORONARY/GRAFT ANGIOGRAPHY N/A 03/11/2019   Procedure: RIGHT/LEFT HEART CATH AND CORONARY/GRAFT ANGIOGRAPHY;  Surgeon: Jolaine Artist, MD;  Location: [facility] CV LAB;  Service: Cardiovascular;  Laterality: N/A;   TEE WITHOUT CARDIOVERSION N/A 11/29/2014   Procedure: TRANSESOPHAGEAL ECHOCARDIOGRAM (TEE);  Surgeon: Melrose Nakayama, MD;  Location: [facility];  Service: Open Heart Surgery;  Laterality: N/A;   TRANSTHORACIC ECHOCARDIOGRAM  07/23/2012   EF 55-60%, normal-mild   TUBAL LIGATION       OB History   No obstetric history on file.     Family History  Problem Relation Age of Onset   Breast cancer Mother    Diabetes Mother    Heart disease Maternal Grandmother    Kidney disease Maternal Grandmother    Diabetes Maternal Grandmother  Glaucoma Maternal Aunt    Heart disease Maternal Aunt    Asthma Sister    Colon cancer Neg Hx    Stomach cancer Neg Hx    Pancreatic cancer Neg Hx     Social History   Tobacco Use   Smoking status: Former    Packs/day: 0.25    Years: 3.00    Pack years: 0.75    Types: Cigarettes    Quit date: 02/11/1968    Years since quitting: 52.6   Smokeless tobacco: Never  Vaping Use   Vaping Use: Never used  Substance Use Topics   Alcohol use: No   Drug use: No    Home Medications Prior to Admission medications   Medication Sig Start Date End Date Taking? Authorizing Provider  ACCU-CHEK AVIVA PLUS test strip  10/19/19   [provider]  Accu-Chek Softclix Lancets lancets  08/10/19   [provider]  acetaminophen (TYLENOL) 500 MG tablet Take 500-1,000 mg by mouth every 12 (twelve) hours as needed (for pain or headaches).     [provider]  acetaminophen (TYLENOL) 500 MG tablet Take 500 mg by mouth every 6 (six) hours as needed.    [provider]  albuterol (PROVENTIL HFA;VENTOLIN HFA) 108 (90 Base) MCG/ACT inhaler Inhale 2 puffs into the lungs every 6 (six)  hours as needed for wheezing or shortness of breath. 05/02/18   Norval Morton, MD  albuterol (PROVENTIL) (2.5 MG/3ML) 0.083% nebulizer solution Take 3 mLs (2.5 mg total) by nebulization every 6 (six) hours as needed for wheezing or shortness of breath. 05/02/18   Norval Morton, MD  albuterol (VENTOLIN HFA) 108 (90 Base) MCG/ACT inhaler Inhale into the lungs every 6 (six) hours as needed for wheezing or shortness of breath.    [provider]  Alcohol Swabs (B-D SINGLE USE SWABS REGULAR) PADS  08/10/19   [provider]  allopurinol (ZYLOPRIM) 100 MG tablet Take 100 mg by mouth daily. 02/13/20   [provider]  allopurinol (ZYLOPRIM) 100 MG tablet TAKE 1 TABLET BY MOUTH  DAILY 04/23/20   Croitoru, [name], MD  aspirin EC 81 MG tablet Take 1 tablet (81 mg total) by mouth daily. 03/27/16   Erlene Quan, PA-C  aspirin EC 81 MG tablet Take 81 mg by mouth daily. Swallow whole.    [provider]  Biotin 1000 MCG tablet Take 1,000 mcg by mouth daily.    [provider]  Biotin 1000 MCG tablet Take 1,000 mcg by mouth daily.    [provider]  bisoprolol (ZEBETA) 5 MG tablet Take 1 tablet (5 mg total) by mouth daily. 03/06/20   Croitoru, [name], MD  budesonide-formoterol (SYMBICORT) 160-4.5 MCG/ACT inhaler Inhale 2 puffs into the lungs 2 (two) times daily.    [provider]  budesonide-formoterol (SYMBICORT) 160-4.5 MCG/ACT inhaler Inhale 2 puffs into the lungs 2 (two) times daily.    [provider]  cetirizine (ZYRTEC) 10 MG tablet Take 10 mg by mouth daily.  03/24/17   [provider]  clopidogrel (PLAVIX) 75 MG tablet Take 1 tablet (75 mg total) by mouth daily. 03/06/20   Croitoru, [name], MD  clotrimazole (LOTRIMIN) 1 % cream Apply 1 application topically 2 (two) times daily as needed (rash). 05/02/18   Norval Morton, MD  clotrimazole (MYCELEX) 10 MG troche Take 10 mg by mouth daily as needed (Sore Mouth/thrush).      [provider]  clotrimazole (MYCELEX) 10 MG troche Take  10 mg by mouth 5 (five) times daily.    [provider]  colchicine 0.6 MG tablet Take 0.6 mg by mouth 2 (two) times daily as needed (for gout flares).    [provider]  cycloSPORINE (RESTASIS) 0.05 % ophthalmic emulsion Place 1 drop into both eyes 2 (two) times daily.    [provider]  cycloSPORINE (RESTASIS) 0.05 % ophthalmic emulsion Place 1 drop into both eyes 2 (two) times daily.    [provider]  diclofenac sodium (VOLTAREN) 1 % GEL Apply 2 g topically 4 (four) times daily. Patient taking differently: Apply 2 g topically 4 (four) times daily as needed (pain.). 05/02/18   Norval Morton, MD  empagliflozin (JARDIANCE) 10 MG TABS tablet Take 10 mg by mouth daily before breakfast. 06/30/19   Bensimhon, Shaune Pascal, MD  EPINEPHrine 0.3 mg/0.3 mL IJ SOAJ injection Inject 0.3 mg into the muscle once as needed (severe allergic reaction).    [provider]  EPINEPHrine 0.3 mg/0.3 mL IJ SOAJ injection Inject 0.3 mg into the muscle as needed for anaphylaxis.    [provider]  febuxostat (ULORIC) 40 MG tablet Take 40 mg by mouth daily. 11/10/17   [provider]  fluconazole (DIFLUCAN) 100 MG tablet Take 100 mg by mouth daily. 11/28/19   [provider]  guaiFENesin (MUCINEX) 600 MG 12 hr tablet Take 600 mg by mouth at bedtime as needed for cough or to loosen phlegm.     [provider]  HYDROcodone-acetaminophen (NORCO/VICODIN) 5-325 MG tablet Take 1 tablet by mouth every 4 (four) hours as needed for moderate pain. 04/01/20   Tacy Learn, PA-C  hydrocortisone 2.5 % cream Apply 1 application topically daily as needed (itching).  01/08/18   [provider]  hydrocortisone 2.5 % lotion Apply 1 application topically 2 (two) times daily.    [provider]  hydroxypropyl methylcellulose / hypromellose (GONIOVISC) 2.5 % ophthalmic solution  Place 1 drop into both eyes daily as needed for dry eyes.    [provider]  hydroxypropyl methylcellulose / hypromellose (ISOPTO TEARS / GONIOVISC) 2.5 % ophthalmic solution Place 1 drop into both eyes 3 (three) times daily.    [provider]  ipratropium (ATROVENT) 0.02 % nebulizer solution Take 2.5 mLs (0.5 mg total) by nebulization 4 (four) times daily. Patient taking differently: Take 0.5 mg by nebulization every 6 (six) hours as needed for wheezing or shortness of breath. 05/02/18   Fuller Plan A, MD  ipratropium (ATROVENT) 0.03 % nasal spray Place 2 sprays into both nostrils 3 (three) times daily.    [provider]  isosorbide mononitrate (IMDUR) 30 MG 24 hr tablet Take 30 mg by mouth daily.    [provider]  isosorbide mononitrate (IMDUR) 30 MG 24 hr tablet Take 3 tablets (90 mg total) by mouth daily. 07/12/20   Croitoru, [name], MD  isosorbide mononitrate (IMDUR) 60 MG 24 hr tablet TAKE 1 TABLET EVERY DAY 09/19/19   Croitoru, [name], MD  ketoconazole (NIZORAL) 2 % shampoo Apply 1 application topically 2 (two) times a week.    [provider]  loratadine (CLARITIN) 10 MG tablet Take 10 mg by mouth daily. 11/23/19   [provider]  nitroGLYCERIN (NITROSTAT) 0.4 MG SL tablet Place 1 tablet (0.4 mg total) under the tongue every 5 (five) minutes as needed for chest pain. 11/13/16 03/03/20  Erlene Quan, PA-C  ondansetron (ZOFRAN) 4 MG tablet Take 4 mg by mouth every  8 (eight) hours as needed. 09/10/19   [provider]  ONGLYZA 2.5 MG TABS tablet  07/01/19   [provider]  OXYGEN Inhale 3 L/min into the lungs continuous.     [provider]  pantoprazole (PROTONIX) 40 MG tablet Take 1 tablet (40 mg total) by mouth 2 (two) times daily. 07/08/19   [name], MD  polyethylene glycol (MIRALAX / GLYCOLAX) 17 g packet Take 17 g by mouth daily.    [provider]  potassium chloride SA (KLOR-CON) 20 MEQ  tablet Take 20 mEq by mouth 2 (two) times daily.    [provider]  potassium chloride SA (KLOR-CON) 20 MEQ tablet Take 20 mEq by mouth daily.    [provider]  predniSONE (DELTASONE) 10 MG tablet Take 10 mg by mouth daily as needed (as directed for gout flares).  02/15/19   [provider]  Probiotic Product (PROBIOTIC PO) Take 1 tablet by mouth daily.    [provider]  ranolazine (RANEXA) 1000 MG SR tablet Take 500 mg by mouth 2 (two) times daily.    [provider]  ranolazine (RANEXA) 1000 MG SR tablet TAKE 1 TABLET BY MOUTH  TWICE DAILY 07/12/20   Croitoru, [name], MD  rosuvastatin (CRESTOR) 20 MG tablet Take 1 tablet (20 mg total) by mouth daily. NEED OV. 08/17/20   Croitoru, [name], MD  saxagliptin HCl (ONGLYZA) 2.5 MG TABS tablet Take 2.5 mg by mouth daily.    [provider]  tiZANidine (ZANAFLEX) 4 MG tablet  10/15/19   [provider]  torsemide (DEMADEX) 20 MG tablet Take 1 tablet (20 mg total) by mouth daily. 04/25/20   Croitoru, [name], MD  traMADol (ULTRAM) 50 MG tablet Take 50 mg by mouth 3 (three) times daily as needed. 09/14/19   [provider]  triamcinolone cream (KENALOG) 0.1 % Apply 1 application topically 2 (two) times daily as needed (itching).  08/24/14   [provider]    Allergies    Ivp dye [iodinated diagnostic agents], Shellfish allergy, Shellfish-derived products, Sulfa antibiotics, Iodine, Atorvastatin, Azithromycin, Benadryl [diphenhydramine], Contrast media [iodinated diagnostic agents], Doxycycline, Doxycycline hyclate, Keflex [cephalexin], Shellfish allergy, Sulfa antibiotics, Uloric [febuxostat], Cephalexin, and Zithromax [azithromycin]  Review of Systems   Review of Systems  Constitutional:  Negative for chills and fever.  HENT:  Positive for sinus pain.   Respiratory:  Positive for shortness of breath. Negative for cough.   Cardiovascular:  Positive for chest pain.  Gastrointestinal:   Negative for diarrhea, nausea and vomiting.  Neurological:  Positive for weakness, light-headedness and headaches. Negative for dizziness, syncope and numbness.  All other systems reviewed and are negative.  Physical Exam Updated Vital Signs BP (!) 164/81 (BP Location: Right Arm)   Pulse 69   Temp 98.3 F (36.8 C) (Oral)   Resp 18   SpO2 97%   Physical Exam Vitals and nursing note reviewed.  Constitutional:      General: She is not in acute distress.    Appearance: She is not toxic-appearing.  HENT:     Head: Normocephalic and atraumatic.  Eyes:     Extraocular Movements: Extraocular movements intact.     Pupils: Pupils are equal, round, and reactive to light.     Comments: No neglect.   Cardiovascular:     Rate and Rhythm: Normal rate and regular rhythm.  Pulmonary:     Effort: Pulmonary effort is normal. No respiratory distress.     Breath sounds:  Normal breath sounds. No wheezing, rhonchi or rales.  Abdominal:     Palpations: Abdomen is soft.     Tenderness: There is no abdominal tenderness. There is no guarding or rebound.  Musculoskeletal:     Cervical back: Neck supple. No rigidity.  Skin:    General: Skin is warm and dry.  Neurological:     Mental Status: She is alert.     Comments: Alert. Clear speech. No aphasia. PERRL. EOMI.  Some drift in LUE/LLE. Intact finger to nose bilaterally   Psychiatric:        Behavior: Behavior normal.    ED Results / Procedures / Treatments   Labs (all labs ordered are listed, but only abnormal results are displayed) Labs Reviewed  I-STAT CHEM 8, ED - Abnormal; Notable for the following components:      Result Value   Chloride 95 (*)    Creatinine, Ser 1.20 (*)    Glucose, Bld 125 (*)    Calcium, Ion 1.07 (*)    TCO2 40 (*)    All other components within normal limits  CBG MONITORING, ED - Abnormal; Notable for the following components:   Glucose-Capillary 122 (*)    All other components within normal limits  RESP PANEL  BY RT-PCR (FLU A&B, COVID) ARPGX2  PROTIME-INR  CBC  APTT  DIFFERENTIAL  COMPREHENSIVE METABOLIC PANEL  TROPONIN I (HIGH SENSITIVITY)    EKG None  Radiology DG Chest Portable 1 View  Result Date: 09/16/2020 CLINICAL DATA:  chest pain EXAM: PORTABLE CHEST 1 VIEW COMPARISON:  April 01, 2020. FINDINGS: Enlarged cardiac silhouette. Central pulmonary vascular congestion. Tortuous aorta. Median sternotomy and CABG. Calcific atherosclerosis aorta. Bibasilar and right midlung opacities. Possible trace fluid layering along the right minor fissure. Otherwise, no visible pleural effusions or pneumothorax on this single semi erect radiograph. IMPRESSION: 1. Cardiomegaly and central pulmonary vascular congestion. Possible trace fluid layering along the right minor fissure. 2. Low lung volumes with right midlung and bibasilar opacities, most likely atelectasis. Infection is not excluded. Electronically Signed   By: Margaretha Sheffield MD   On: 09/16/2020 15:47   CT HEAD CODE STROKE WO CONTRAST  Result Date: 09/16/2020 CLINICAL DATA:  Code stroke. Neuro deficit, acute, stroke suspected. Facial droop, slurred speech, and generalized weakness. EXAM: CT HEAD WITHOUT CONTRAST TECHNIQUE: Contiguous axial images were obtained from the base of the skull through the vertex without intravenous contrast. COMPARISON:  04/01/2020 FINDINGS: Brain: There is no evidence of an acute infarct, intracranial hemorrhage, mass, midline shift, or extra-axial fluid collection. Mild cerebral atrophy is within normal limits for age. Hypodensities in the cerebral white matter bilaterally are unchanged and nonspecific but compatible with mild chronic small vessel ischemic disease. There are several chronic punctate calcifications projecting over the left cerebral convexity which may be within sulci and vascular. Vascular: Calcified atherosclerosis at the skull base. No hyperdense vessel. Skull: No fracture or suspicious osseous lesion.  Sinuses/Orbits: Paranasal sinuses and mastoid air cells are clear. Bilateral cataract extraction. Other: None. ASPECTS [facility] Stroke Program Early CT Score) - Ganglionic level infarction (caudate, lentiform nuclei, internal capsule, insula, M1-M3 cortex): 7 - Supraganglionic infarction (M4-M6 cortex): 3 Total score (0-10 with 10 being normal): 10 IMPRESSION: 1. No evidence of acute intracranial abnormality. 2. ASPECTS is 10. 3. Mild chronic small vessel ischemic disease. Code stroke imaging results were communicated on 09/16/2020 at 2:57 pm to Dr. Lorrin Goodell via telephone. Electronically Signed   By: Logan Bores M.D.   On: 09/16/2020 14:57  Procedures Procedures  Medications Ordered in ED Medications  sodium chloride flush (NS) 0.9 % injection 3 mL (has no administration in time range)    ED Course  I have reviewed the triage vital signs and the nursing notes.  Pertinent labs & imaging results that were available during my care of the patient were reviewed by me and considered in my medical decision making (see chart for details).    MDM Rules/Calculators/A&P                           Patient presents to the ED as code stroke Nontoxic, hypertensive.   Additional history obtained:  Additional history obtained from chart review & nursing note review.  Pacemaker- S/p CRT-P in 2014 ([employer])  Last echo 05/2019- EF 50-55% Last right/left heart cath 02/2019-  Assessment:  1. Stable coronary anatomy with no change from previous. SVG to OM system chronically occluded with previous PCI of native LCX system. Remainder of grafts are widely patent 2. Very mild PAH with normal left-sided pressures. 3. Very difficult procedure given patient's size and fidgeting on cath table Plan: Medical therapy.   Lab Tests:  I Ordered, reviewed, and interpreted labs, which included:  CBC: mild thrombocytopenia.  CMP: renal function improved some from prior.  PT/INR: WNL  EKG: Paced.    Imaging Studies ordered:  I ordered imaging studies which included CT head & CXR, I independently reviewed, formal radiology impression shows:  CT head:  1. No evidence of acute intracranial abnormality. 2. ASPECTS is 10. 3. Mild chronic small vessel ischemic disease. CXR: 1. Cardiomegaly and central pulmonary vascular congestion. Possible trace fluid layering along the right minor fissure. 2. Low lung volumes with right midlung and bibasilar opacities, most likely atelectasis. Infection is not excluded.  ED Course:  Some mild left sided drift. Seen by neurologist Dr. Lorrin Goodell- not a tpa candidate, no LVO, plan to admit to medicine for stroke workup . Holding off on further neurologic imaging at this time  given contrast dye allergy and pacemaker not compatible with MRI, plan for repeat head CT tomorrow- please see neurology note for further recommendation.  In terms of patient's intermittent chest discomfort reported- EKG with paced rhythm, no STEMI, troponin mildly elevated- will need trending, CXR with vascular congestion, patient no pain @ present. Will discuss w/ medicine.   17:40: CONSULT: Discussed with hospitalist Dr. Rogers Blocker- accepts admission.   Findings and plan of care discussed with supervising physician Dr. Rogene Houston who has evaluated the patient & is in agreement.   Portions of this note were generated with [employer]. Dictation errors may occur despite best attempts at proofreading.  Final Clinical Impression(s) / ED Diagnoses Final diagnoses:  Left-sided weakness    Rx / DC Orders ED Discharge Orders     None        Amaryllis Dyke, PA-C 09/16/20 1815    Fredia Sorrow, MD 09/21/20 [phone]

## 2020-09-16 NOTE — ED Notes (Signed)
Floor coverage paged for pt BP per orders

## 2020-09-16 NOTE — H&P (Signed)
History and Physical    [name]:[id number] DOB: 05-20-1945 DOA: 09/16/2020  PCP: Dr. Jarold Song  Consultants:  cardiology: Dr. [name] and Dr. Sung Amabile. Nephrology: Dr. Moshe Cipro [facility]: [facility]  Patient coming from:  Home - lives with her son.   Chief Complaint: acute left sided weakness and left facial droop.   HPI: [name] is a 75 y.o. female with medical history significant of CAD with CABG x5 in 11/2014,  multiple TIA on plavix and ASA, diastolic & systolic  CHF, chronic hypoxic respiratory failure on 3L oxygen via [place], CKD stage III, T2DM, HTN, HLD, OSA, morbid obesity, biventricular ICD who presented to ER for acute onset left sided weakness and left facial droop this AM around  9:00AM. LKW before 9am. She called her daughter in law to come and look at her and they thought she had a droop. She also had some slurred speech. She had some left sided weakness in her left leg and difficulty walking. They called 911 and EMS brought her in. She has had a headache on and off for a few weeks but attributed this to her sinuses. Denies any vision changes, coughing, fever/chills, stomach pain, N/V/D, leg swelling or weight gain. She has had intermittent chest pain for a few weeks and seems to be worse with exertion. She notices it most when she is cleaning and working around th house. Pain is substernal and does not radiate. Pain described as stabbing. She  has no diaphoresis or increased shortness of breath with this. Goes away with NG. Has not talked to her cardiologist regarding this.    ED Course: vitals: afebrile, bp: 164/81, HR: 69, RR: 18, oxygen: 97% on 3L [place].  Pertinent labs: platelets 106, creatinine 1.29 (baseline: 1.77-2.19) troponin: 74-->pending CTH: no acute findings. Code stroke, neurology following. Asked to admit for stroke work up.   Review of Systems: As per HPI; otherwise review of systems reviewed and negative.   Ambulatory Status:  Ambulates without  assistance   Past Medical History:  Diagnosis Date   ABDOMINAL PAIN -GENERALIZED 02/21/2010   Qualifier: Diagnosis of  By: Chester Holstein NP, Nevin Bloodgood     AICD (automatic cardioverter/defibrillator) present    Anemia    Arthritis    Asthma    Atrial fibrillation ([place]) 10/24/2015   Questionable history of A Fib/A Flutter. On device interrogation on 9/7, showed 0.44min of A Fib/A Flutter with 1% burden.  Device check in Jan 2018 showed no sustained AF (runs of less than 30 seconds). No anticoagulation indicated.   CAD (coronary artery disease)    a. 10/09/16 LHC: SVG->LAD patent, SVG->Diag patent, SVG->RCA patent, SVG->LCx occluded. EF 60%, b. 10/31/16 LHC DES to AV groove Circ, DES to intermed branch   Cellulitis and abscess of foot 12/2014   RT FOOT   CHF (congestive heart failure) (HCC)    Chronic bronchitis (HCC)    Chronic lower back pain    Complete heart block ([place]) 06/22/2015   Diverticulosis    Facial numbness 02/2016   Fatty liver disease, nonalcoholic    Gastritis    GERD (gastroesophageal reflux disease)    Gout    H/O hiatal hernia    HEMORRHOIDS-EXTERNAL 02/21/2010   Qualifier: Diagnosis of  By: Chester Holstein NP, Paula     High cholesterol    Hyperplastic colon polyp    Hypertension    IBS (irritable bowel syndrome)    Internal hemorrhoids    INTERNAL HEMORRHOIDS WITHOUT MENTION COMP 04/12/2007   Qualifier: Diagnosis  of  By: Olevia Perches MD, Lowella Bandy    Ischemic cardiomyopathy    Nonischemic cardiomyopathy ([place]) 11/22/2011   Pt responded to BiV ICD- last EF 55-60% Sept 2017   Obesity    On home oxygen therapy    "2L at night" (10/31/2016)   OSA (obstructive sleep apnea)    "can't tolerate a mask" (10/30/2016)   PERSONAL HX COLONIC POLYPS 02/21/2010   Qualifier: Diagnosis of  By: Chester Holstein NP, Paula     Pneumonia    "couple times" (10/31/2016)   Right facial numbness 03/05/2016   Shortness of breath    TIA (transient ischemic attack)    "recently" (10/31/2016)   Type II diabetes mellitus  ([place])     Past Surgical History:  Procedure Laterality Date   ABDOMINAL ULTRASOUND  12/01/2011   Peripelvic cysts- #1- 2.4x1.9x2.3cm, #2-1.2x0.9x1.2cm   ANKLE FRACTURE SURGERY Right    "had rod put in"   ANTERIOR CERVICAL DECOMP/DISCECTOMY FUSION     APPENDECTOMY     BACK SURGERY     BIOPSY  12/29/2017   Procedure: BIOPSY;  Surgeon: [name], MD;  [facility];  Service: Endoscopy;;   BIV ICD INSERTION CRT-D  2001?   BIV PACEMAKER GENERATOR CHANGE OUT N/A 11/02/2012   Procedure: BIV PACEMAKER GENERATOR CHANGE OUT;  Surgeon: Sanda Klein, MD;  Location: [facility];  Service: Cardiovascular;  Laterality: N/A;   CARDIAC CATHETERIZATION  05/17/1999   No significant coronary obstructive disease w/ mild 20% luminal irregularity of the first diag branch of the LAD   CARDIAC CATHETERIZATION  07/08/2002   No significant CAD, moderately depressed LV systolic function   CARDIAC CATHETERIZATION Bilateral 04/26/2007   Normal findings, recommend medical therapy   CARDIAC CATHETERIZATION  02/18/2008   Moderate CAD, would benefit from having a functional study, recommend continue medical therapy   CARDIAC CATHETERIZATION  07/23/2012   Medical therapy   CARDIAC CATHETERIZATION N/A 11/24/2014   Procedure: Left Heart Cath and Coronary Angiography;  Surgeon: Troy Sine, MD;  Location: [facility];  Service: Cardiovascular;  Laterality: N/A;   CARDIAC CATHETERIZATION  11/27/2014   Procedure: Intravascular Pressure Wire/FFR Study;  Surgeon: Peter M Martinique, MD;  Location: [place] [facility];  Service: Cardiovascular;;   CARDIAC CATHETERIZATION  10/09/2016   CHOLECYSTECTOMY N/A 04/09/2018   Procedure: LAPAROSCOPIC CHOLECYSTECTOMY;  Surgeon: Greer Pickerel, MD;  [facility];  Service: General;  Laterality: N/A;   COLONOSCOPY WITH PROPOFOL N/A 12/29/2017   Procedure: COLONOSCOPY WITH PROPOFOL;  Surgeon: [name], MD;  [facility];  Service: Endoscopy;   Laterality: N/A;   CORONARY ANGIOGRAM  2010   CORONARY ARTERY BYPASS GRAFT N/A 11/29/2014   Procedure: CORONARY ARTERY BYPASS GRAFTING x 5 (LIMA-LAD, SVG-D, SVG-OM1-OM2, SVG-PD);  Surgeon: Melrose Nakayama, MD;  Location: [facility];  Service: Open Heart Surgery;  Laterality: N/A;   CORONARY STENT INTERVENTION N/A 10/31/2016   Procedure: CORONARY STENT INTERVENTION;  Surgeon: Burnell Blanks, MD;  Location: [place] [facility];  Service: Cardiovascular;  Laterality: N/A;   ESOPHAGOGASTRODUODENOSCOPY (EGD) WITH PROPOFOL N/A 12/29/2017   Procedure: ESOPHAGOGASTRODUODENOSCOPY (EGD) WITH PROPOFOL;  Surgeon: [name], MD;  [facility];  Service: Endoscopy;  Laterality: N/A;   FRACTURE SURGERY     INSERT / REPLACE / [place] ARTHROSCOPY Bilateral    LEFT HEART CATH AND CORS/GRAFTS ANGIOGRAPHY N/A 12/09/2017   Procedure: LEFT HEART CATH AND CORS/GRAFTS ANGIOGRAPHY;  Surgeon: Shelva Majestic  A, MD;  Location: [place] [facility];  Service: Cardiovascular;  Laterality: N/A;   LEFT HEART CATHETERIZATION WITH CORONARY ANGIOGRAM N/A 07/23/2012   Procedure: LEFT HEART CATHETERIZATION WITH CORONARY ANGIOGRAM;  Surgeon: Leonie Man, MD;  Location: [facility] CATH LAB;  Service: Cardiovascular;  Laterality: N/A;   LEXISCAN MYOVIEW  11/14/2011   Mild-moderate defect seen in Mid Inferolateral and Mid Anterolateral regions-consistant w/ infarct/scar. No significant ischemia demonstrated.   POLYPECTOMY  12/29/2017   Procedure: POLYPECTOMY;  Surgeon: [name], MD;  [facility];  Service: Endoscopy;;   RIGHT/LEFT HEART CATH AND CORONARY ANGIOGRAPHY N/A 10/09/2016   Procedure: RIGHT/LEFT HEART CATH AND CORONARY ANGIOGRAPHY;  Surgeon: Jolaine Artist, MD;  Location: [place] [facility] LAB;  Service: Cardiovascular;  Laterality: N/A;   RIGHT/LEFT HEART CATH AND CORONARY/GRAFT ANGIOGRAPHY N/A 03/11/2019   Procedure: RIGHT/LEFT HEART CATH AND CORONARY/GRAFT  ANGIOGRAPHY;  Surgeon: Jolaine Artist, MD;  Location: [place] [facility] LAB;  Service: Cardiovascular;  Laterality: N/A;   TEE WITHOUT CARDIOVERSION N/A 11/29/2014   Procedure: TRANSESOPHAGEAL ECHOCARDIOGRAM (TEE);  Surgeon: Melrose Nakayama, MD;  Location: [facility];  Service: Open Heart Surgery;  Laterality: N/A;   TRANSTHORACIC ECHOCARDIOGRAM  07/23/2012   EF 55-60%, normal-mild   TUBAL LIGATION      Social History   Socioeconomic History   Marital status: Widowed    Spouse name: Not on file   Number of children: 5   Years of education: Not on file   Highest education level: Not on file  Occupational History   Occupation: STAFF/BUFFET    Employer:  [employer]  Tobacco Use   Smoking status: Former    Packs/day: 0.25    Years: 3.00    Pack years: 0.75    Types: Cigarettes    Quit date: 02/11/1968    Years since quitting: 52.6   Smokeless tobacco: Never  Vaping Use   Vaping Use: Never used  Substance and Sexual Activity   Alcohol use: No   Drug use: No   Sexual activity: Never  Other Topics Concern   Not on file  Social History Narrative   Widowed last year. She lives with her two sons.   Social Determinants of Health   Financial Resource Strain: Not on file  Food Insecurity: Not on file  Transportation Needs: Not on file  Physical Activity: Not on file  Stress: Not on file  Social Connections: Not on file  Intimate Partner Violence: Not on file    Allergies  Allergen Reactions   Ivp Dye [Iodinated Diagnostic Agents] Shortness Of Breath    No reaction to PO contrast with non-ionic dye.06-25-2014/rsm   Shellfish Allergy Anaphylaxis   Sulfa Antibiotics Shortness Of Breath   Iodine Hives   Atorvastatin Other (See Comments)    Pt states "causes bilateral leg pain/cramps."   Benadryl [Diphenhydramine] Other (See Comments)    "THIS DRIVES ME CRAZY AND MAKES ME FEEL LIKE I AM DYING"   Colchicine Nausea And Vomiting   Contrast Media [Iodinated  Diagnostic Agents] Hives   Doxycycline Other (See Comments)    Unknown reaction, per patient   Uloric [Febuxostat] Other (See Comments)    Unknown reaction   Cephalexin Itching and Rash   Zithromax [Azithromycin] Rash    Family History  Problem Relation Age of Onset   Breast cancer Mother    Diabetes Mother    Heart disease Maternal Grandmother    Kidney disease Maternal Grandmother    Diabetes Maternal Grandmother  Glaucoma Maternal Aunt    Heart disease Maternal Aunt    Asthma Sister    Colon cancer Neg Hx    Stomach cancer Neg Hx    Pancreatic cancer Neg Hx     Prior to Admission medications   Medication Sig Start Date End Date Taking? Authorizing Provider  acetaminophen (TYLENOL) 500 MG tablet Take 1,000 mg by mouth 2 (two) times daily as needed (for pain or headaches).   Yes [provider]  albuterol (PROVENTIL HFA;VENTOLIN HFA) 108 (90 Base) MCG/ACT inhaler Inhale 2 puffs into the lungs every 6 (six) hours as needed for wheezing or shortness of breath. Patient taking differently: Inhale 2 puffs into the lungs every 4 (four) hours as needed for wheezing or shortness of breath. 05/02/18  Yes Fuller Plan A, MD  albuterol (PROVENTIL) (2.5 MG/3ML) 0.083% nebulizer solution Take 3 mLs (2.5 mg total) by nebulization every 6 (six) hours as needed for wheezing or shortness of breath. 05/02/18  Yes Fuller Plan A, MD  allopurinol (ZYLOPRIM) 100 MG tablet TAKE 1 TABLET BY MOUTH  DAILY Patient taking differently: Take 100 mg by mouth 2 (two) times daily. 04/23/20  Yes Croitoru, [name], MD  aspirin EC 81 MG tablet Take 1 tablet (81 mg total) by mouth daily. 03/27/16  Yes Kilroy, Luke K, PA-C  budesonide-formoterol (SYMBICORT) 160-4.5 MCG/ACT inhaler Inhale 2 puffs into the lungs 2 (two) times daily.   Yes [provider]  carboxymethylcellulose (REFRESH PLUS) 0.5 % SOLN Place 1 drop into both eyes 3 (three) times daily.   Yes [provider]  clopidogrel  (PLAVIX) 75 MG tablet Take 1 tablet (75 mg total) by mouth daily. Patient taking differently: Take 75 mg by mouth at bedtime. 03/06/20  Yes Croitoru, [name], MD  cycloSPORINE (RESTASIS) 0.05 % ophthalmic emulsion Place 1 drop into both eyes every 12 (twelve) hours.   Yes [provider]  empagliflozin (JARDIANCE) 10 MG TABS tablet Take 10 mg by mouth daily before breakfast. 06/30/19  Yes Bensimhon, Shaune Pascal, MD  EPINEPHrine 0.3 mg/0.3 mL IJ SOAJ injection Inject 0.3 mg into the muscle once as needed (severe allergic reaction).   Yes [provider]  ferrous sulfate 325 (65 FE) MG tablet Take 325 mg by mouth daily with breakfast.   Yes [provider]  fluticasone (FLONASE) 50 MCG/ACT nasal spray Place 1 spray into both nostrils daily. 03/22/20  Yes [provider]  ipratropium (ATROVENT) 0.02 % nebulizer solution Take 2.5 mLs (0.5 mg total) by nebulization 4 (four) times daily. Patient taking differently: Take 0.5 mg by nebulization every 6 (six) hours as needed for wheezing or shortness of breath. 05/02/18  Yes Fuller Plan A, MD  isosorbide mononitrate (IMDUR) 30 MG 24 hr tablet Take 3 tablets (90 mg total) by mouth daily. 07/12/20  Yes Croitoru, [name], MD  loratadine (CLARITIN) 10 MG tablet Take 10 mg by mouth daily. 11/23/19  Yes [provider]  Multiple Vitamins-Minerals (HAIR/SKIN/NAILS/BIOTIN) TABS Take 1 tablet by mouth every morning.   Yes [provider]  nitroGLYCERIN (NITROSTAT) 0.4 MG SL tablet Place 1 tablet (0.4 mg total) under the tongue every 5 (five) minutes as needed for chest pain. 11/13/16 09/16/20 Yes Kilroy, Luke K, PA-C  nystatin cream (MYCOSTATIN) Apply 1 application topically 2 (two) times daily as needed (rash). 06/14/20  Yes [provider]  OXYGEN Inhale 3 L/min into the lungs continuous.    Yes [provider]  pantoprazole (PROTONIX) 40 MG tablet Take 1 tablet (  40 mg total) by mouth 2 (two) times daily.  07/08/19  Yes Nandigam, Venia Minks, MD  polyethylene glycol (MIRALAX / GLYCOLAX) 17 g packet Take 17 g by mouth daily.   Yes [provider]  potassium chloride SA (KLOR-CON) 20 MEQ tablet Take 20 mEq by mouth every morning.   Yes [provider]  predniSONE (DELTASONE) 10 MG tablet Take 10 mg by mouth daily as needed (as directed for gout flares).  02/15/19  Yes [provider]  ranolazine (RANEXA) 1000 MG SR tablet TAKE 1 TABLET BY MOUTH  TWICE DAILY Patient taking differently: Take 1,000 mg by mouth 2 (two) times daily. 07/12/20  Yes Croitoru, [name], MD  rosuvastatin (CRESTOR) 20 MG tablet Take 1 tablet (20 mg total) by mouth daily. NEED OV. Patient taking differently: Take 20 mg by mouth every evening. NEED OV. 08/17/20  Yes Croitoru, [name], MD  torsemide (DEMADEX) 20 MG tablet Take 1 tablet (20 mg total) by mouth daily. Patient taking differently: Take 20 mg by mouth 2 (two) times daily with breakfast and lunch. 04/25/20  Yes Croitoru, Dani Gobble, MD  ACCU-CHEK AVIVA PLUS test strip  10/19/19   [provider]  Accu-Chek Softclix Lancets lancets  08/10/19   [provider]  Alcohol Swabs (B-D SINGLE USE SWABS REGULAR) PADS  08/10/19   [provider]  bisoprolol (ZEBETA) 5 MG tablet Take 1 tablet (5 mg total) by mouth daily. Patient not taking: No sig reported 03/06/20   Croitoru, [name], MD  diclofenac sodium (VOLTAREN) 1 % GEL Apply 2 g topically 4 (four) times daily. Patient not taking: Reported on 09/16/2020 05/02/18   Norval Morton, MD    Physical Exam: Vitals:   09/16/20 1715 09/16/20 1730 09/16/20 1745 09/16/20 1800  BP: (!) 168/95 (!) 137/99 (!) 155/84   Pulse: 66 67 67   Resp: 16 19 19    Temp:      TempSrc:      SpO2: 98% 99% 98%   Weight:    108.9 kg     General:  Appears calm and comfortable and is in NAD Eyes:  PERRL, EOMI, normal lids, iris. Left eye with some scleral irritation/injection.  ENT:  grossly normal hearing, lips &  tongue, mmm; appropriate dentition Neck:  no LAD, masses or thyromegaly; no carotid bruits Cardiovascular:  RRR, no m/r/g. No LE edema.  Respiratory:   lung bases with velcro/crackles sounds, no wheezing Normal respiratory effort. Abdomen:  soft, NT, ND, NABS. Obese  Back:   normal alignment, no CVAT Skin:  no rash or induration seen on limited exam Musculoskeletal:  right UE and LE with 5/5 strength. Left UE: 5/5, left LE: 2-3/5, good ROM, no bony abnormality Lower extremity:  No LE edema.  Limited foot exam with no ulcerations.  2+ distal pulses. Psychiatric:  grossly normal mood and affect, speech fluent and appropriate, AOx3 Neurologic:  CN 2-12 grossly intact, moves all extremities in coordinated fashion, sensation intact. Right heel to knee intact, can not complete with left leg. Diadochokinesis intact. Negative pronator drift. Gait deferred.     Radiological Exams on Admission: Independently reviewed - see discussion in A/P where applicable  DG Chest Portable 1 View  Result Date: 09/16/2020 CLINICAL DATA:  chest pain EXAM: PORTABLE CHEST 1 VIEW COMPARISON:  April 01, 2020. FINDINGS: Enlarged cardiac silhouette. Central pulmonary vascular congestion. Tortuous aorta. Median sternotomy and CABG. Calcific atherosclerosis aorta. Bibasilar and right midlung opacities. Possible trace fluid layering along the right minor fissure. Otherwise,  no visible pleural effusions or pneumothorax on this single semi erect radiograph. IMPRESSION: 1. Cardiomegaly and central pulmonary vascular congestion. Possible trace fluid layering along the right minor fissure. 2. Low lung volumes with right midlung and bibasilar opacities, most likely atelectasis. Infection is not excluded. Electronically Signed   By: Margaretha Sheffield MD   On: 09/16/2020 15:47   CT HEAD CODE STROKE WO CONTRAST  Result Date: 09/16/2020 CLINICAL DATA:  Code stroke. Neuro deficit, acute, stroke suspected. Facial droop, slurred speech,  and generalized weakness. EXAM: CT HEAD WITHOUT CONTRAST TECHNIQUE: Contiguous axial images were obtained from the base of the skull through the vertex without intravenous contrast. COMPARISON:  04/01/2020 FINDINGS: Brain: There is no evidence of an acute infarct, intracranial hemorrhage, mass, midline shift, or extra-axial fluid collection. Mild cerebral atrophy is within normal limits for age. Hypodensities in the cerebral white matter bilaterally are unchanged and nonspecific but compatible with mild chronic small vessel ischemic disease. There are several chronic punctate calcifications projecting over the left cerebral convexity which may be within sulci and vascular. Vascular: Calcified atherosclerosis at the skull base. No hyperdense vessel. Skull: No fracture or suspicious osseous lesion. Sinuses/Orbits: Paranasal sinuses and mastoid air cells are clear. Bilateral cataract extraction. Other: None. ASPECTS [facility] Stroke Program Early CT Score) - Ganglionic level infarction (caudate, lentiform nuclei, internal capsule, insula, M1-M3 cortex): 7 - Supraganglionic infarction (M4-M6 cortex): 3 Total score (0-10 with 10 being normal): 10 IMPRESSION: 1. No evidence of acute intracranial abnormality. 2. ASPECTS is 10. 3. Mild chronic small vessel ischemic disease. Code stroke imaging results were communicated on 09/16/2020 at 2:57 pm to Dr. Lorrin Goodell via telephone. Electronically Signed   By: Logan Bores M.D.   On: 09/16/2020 14:57    EKG: Independently reviewed.  NSR with rate 67, ventricular paced; nonspecific ST changes with no evidence of acute ischemia   Labs on Admission: I have personally reviewed the available labs and imaging studies at the time of the admission.  Pertinent labs:  platelets 106,  creatinine 1.29 (baseline: 1.77-2.19)  troponin: 74-->pending CTH: no acute findings. CXR: cardiomegaly with central pulmonary vascular congestion.   Assessment/Plan Principal Problem:   Acute  left-sided weakness Facial droop resolved, but continues to have left sided lower extremity weakness concerning for stroke. Will admit for stroke work up Neurology following and appreciate recommendation -can not MRI or do a CT with contrast. Recommended repeat head CT in 24 hours which will need to be ordered tomorrow for 14:40 -echo pending, US carotid duplex and transcranial dopplers ordered per neurology recommendation -lipid panel and a1c pending, she is on crestor. Goal LDL <70 -continuing her home ASA and plavix -permissive HTN in first 24 hours, but on no medication -DVT ppx with lovenox -telemetry monitoring -passed bedside swallow eval and diet ordered -PT/OT/ST consult placed  Active Problems:  Chest pain No active chest pain today, had over the past several weeks to months.  Multiple risk factors and hx consistent of anginal type chest pain Troponin elevated, repeat pending ventricular paced ekg, St-t waves uninterpreted. Looked over with cardiology, No change from previous ekg.  -if troponin trends upward would consult cardiology   CAD (coronary artery disease) -CABGx5 -continue telemetry and her medication with imdur, ranexa, statin, ASA/plavix.     Essential hypertension On no medication for blood pressure. Her bisoprolol was recently stopped by her kidney doctor.  Continue to follow and allow for permissive HTN in first 24 hours.     History of TIA (transient  ischemic attack) Continue plavix and ASA and statin     CKD (chronic kidney disease), stage III ([place]) Renal function actually improved from her baseline Continue to avoid nephrotoxic drugs  Monitor     DM2 (diabetes mellitus, type 2) ([place]) Last a1c: 4/21: 7.1. repeat pending Continue jardiance and SSI Accuchecks before meals and at bedtime.  Continue to encourage weight loss and congratulated her on her recent weight loss     Asthma Continue scheduled and PRN inhalers Stable at this time.      Gastroesophageal reflux disease Continue her protonix     OSA (obstructive sleep apnea) Can not tolerate cpap    Chronic diastolic CHF (congestive heart failure) (HCC) No signs of exacerbation, does have findings of congestion on CXR, but clinically she has no edema, cough, or weight gain and has actually lost weight. Not requiring anymore oxygen. Does have crackles in her bases.  Strict I/O and daily weights Continue home diuretic and potassium. May need some IV lasix, but will f/u on echo and follow her clinically tonight.  Echo pending for stroke work up     Chronic respiratory failure with hypoxia and hypercapnia (HCC)  Stable on home 3L [place]  Thrombocytopenia -repeat to verify tonight.  -new finding? Repeat and follow closely continue lovenox for now, but hold if drops <100K.   Body mass index is 43.91 kg/m.   Level of care: Telemetry Medical DVT prophylaxis:  Lovenox watch platelets closely.  Code Status:  Full - confirmed with patient Family Communication: None present Disposition Plan:  The patient is from: home  Anticipated d/c is to: home   Requires inpatient hospitalization and is at significant risk of neurological worsening, requires constant monitoring, assessment and MDM with specialists.  Patient is currently: stable.  Consults called: neurology   Admission status:  observation    Orma Flaming MD [facility]   How to contact the [facility] Attending or Consulting provider [place] or covering provider during after hours [place], for this patient?  Check the care team in [facility] and look for a) attending/consulting [facility] provider listed and b) the [facility] team listed Log into [email or web address] and use [place]'s universal password to access. If you do not have the password, please contact the hospital operator. Locate the [facility] provider you are looking for under [facility] and page to a number that you can be directly reached. If you still have difficulty reaching  the provider, please page the [facility] (Director on Call) for the Hospitalists listed on amion for assistance.   09/16/2020, 6:49 PM

## 2020-09-16 NOTE — ED Notes (Signed)
Dr. Clearence Ped notified of pt BP and EKG reading out ST elevation. New EKG obtained and MD notified. Pt reports no CP at this time. No new orders as of now.

## 2020-09-17 ENCOUNTER — Observation Stay (HOSPITAL_BASED_OUTPATIENT_CLINIC_OR_DEPARTMENT_OTHER): Payer: Medicare Other

## 2020-09-17 ENCOUNTER — Observation Stay (HOSPITAL_COMMUNITY): Payer: Medicare Other

## 2020-09-17 ENCOUNTER — Encounter (HOSPITAL_COMMUNITY): Payer: Self-pay | Admitting: Family Medicine

## 2020-09-17 DIAGNOSIS — R531 Weakness: Secondary | ICD-10-CM | POA: Diagnosis not present

## 2020-09-17 DIAGNOSIS — J9611 Chronic respiratory failure with hypoxia: Secondary | ICD-10-CM

## 2020-09-17 DIAGNOSIS — I6523 Occlusion and stenosis of bilateral carotid arteries: Secondary | ICD-10-CM | POA: Diagnosis not present

## 2020-09-17 DIAGNOSIS — I671 Cerebral aneurysm, nonruptured: Secondary | ICD-10-CM | POA: Diagnosis not present

## 2020-09-17 DIAGNOSIS — I6389 Other cerebral infarction: Secondary | ICD-10-CM | POA: Diagnosis not present

## 2020-09-17 DIAGNOSIS — G4733 Obstructive sleep apnea (adult) (pediatric): Secondary | ICD-10-CM | POA: Diagnosis not present

## 2020-09-17 DIAGNOSIS — N1831 Chronic kidney disease, stage 3a: Secondary | ICD-10-CM | POA: Diagnosis not present

## 2020-09-17 DIAGNOSIS — R9082 White matter disease, unspecified: Secondary | ICD-10-CM | POA: Diagnosis not present

## 2020-09-17 DIAGNOSIS — J9612 Chronic respiratory failure with hypercapnia: Secondary | ICD-10-CM | POA: Diagnosis not present

## 2020-09-17 DIAGNOSIS — Z950 Presence of cardiac pacemaker: Secondary | ICD-10-CM | POA: Diagnosis not present

## 2020-09-17 DIAGNOSIS — I2510 Atherosclerotic heart disease of native coronary artery without angina pectoris: Secondary | ICD-10-CM | POA: Diagnosis not present

## 2020-09-17 DIAGNOSIS — Z8673 Personal history of transient ischemic attack (TIA), and cerebral infarction without residual deficits: Secondary | ICD-10-CM | POA: Diagnosis not present

## 2020-09-17 DIAGNOSIS — I428 Other cardiomyopathies: Secondary | ICD-10-CM | POA: Diagnosis not present

## 2020-09-17 DIAGNOSIS — I6521 Occlusion and stenosis of right carotid artery: Secondary | ICD-10-CM | POA: Diagnosis not present

## 2020-09-17 DIAGNOSIS — E1122 Type 2 diabetes mellitus with diabetic chronic kidney disease: Secondary | ICD-10-CM | POA: Diagnosis not present

## 2020-09-17 DIAGNOSIS — I10 Essential (primary) hypertension: Secondary | ICD-10-CM | POA: Diagnosis not present

## 2020-09-17 LAB — CBC WITH DIFFERENTIAL/PLATELET
Abs Immature Granulocytes: 0.08 10*3/uL — ABNORMAL HIGH (ref 0.00–0.07)
Basophils Absolute: 0 10*3/uL (ref 0.0–0.1)
Basophils Relative: 1 %
Eosinophils Absolute: 0.1 10*3/uL (ref 0.0–0.5)
Eosinophils Relative: 2 %
HCT: 45.1 % (ref 36.0–46.0)
Hemoglobin: 13.2 g/dL (ref 12.0–15.0)
Immature Granulocytes: 1 %
Lymphocytes Relative: 15 %
Lymphs Abs: 1 10*3/uL (ref 0.7–4.0)
MCH: 29.3 pg (ref 26.0–34.0)
MCHC: 29.3 g/dL — ABNORMAL LOW (ref 30.0–36.0)
MCV: 100 fL (ref 80.0–100.0)
Monocytes Absolute: 0.5 10*3/uL (ref 0.1–1.0)
Monocytes Relative: 8 %
Neutro Abs: 4.6 10*3/uL (ref 1.7–7.7)
Neutrophils Relative %: 73 %
Platelets: 138 10*3/uL — ABNORMAL LOW (ref 150–400)
RBC: 4.51 MIL/uL (ref 3.87–5.11)
RDW: 14.1 % (ref 11.5–15.5)
WBC: 6.2 10*3/uL (ref 4.0–10.5)
nRBC: 0 % (ref 0.0–0.2)

## 2020-09-17 LAB — BASIC METABOLIC PANEL
Anion gap: 11 (ref 5–15)
BUN: 15 mg/dL (ref 8–23)
CO2: 26 mmol/L (ref 22–32)
Calcium: 9.1 mg/dL (ref 8.9–10.3)
Chloride: 99 mmol/L (ref 98–111)
Creatinine, Ser: 1.12 mg/dL — ABNORMAL HIGH (ref 0.44–1.00)
GFR, Estimated: 51 mL/min — ABNORMAL LOW (ref >60)
Glucose, Bld: 127 mg/dL — ABNORMAL HIGH (ref 70–99)
Potassium: 4.3 mmol/L (ref 3.5–5.1)
Sodium: 136 mmol/L (ref 135–145)

## 2020-09-17 LAB — GLUCOSE, CAPILLARY
Glucose-Capillary: 121 mg/dL — ABNORMAL HIGH (ref 70–99)
Glucose-Capillary: 109 mg/dL — ABNORMAL HIGH (ref 70–99)
Glucose-Capillary: 199 mg/dL — ABNORMAL HIGH (ref 70–99)

## 2020-09-17 LAB — HEMOGLOBIN A1C
Hgb A1c MFr Bld: 6.5 % — ABNORMAL HIGH (ref 4.8–5.6)
Mean Plasma Glucose: 139.85 mg/dL

## 2020-09-17 LAB — ECHOCARDIOGRAM COMPLETE
AR max vel: 1.43 cm2
AV Area VTI: 1.43 cm2
AV Area mean vel: 1.42 cm2
AV Mean grad: 9.5 mmHg
AV Peak grad: 17.2 mmHg
Ao pk vel: 2.08 m/s
Area-P 1/2: 3.27 cm2
S' Lateral: 3.7 cm
Weight: 3841.3 oz

## 2020-09-17 LAB — CBG MONITORING, ED: Glucose-Capillary: 118 mg/dL — ABNORMAL HIGH (ref 70–99)

## 2020-09-17 LAB — LIPID PANEL
Cholesterol: 175 mg/dL (ref 0–200)
HDL: 50 mg/dL (ref >40)
LDL Cholesterol: 90 mg/dL (ref 0–99)
Total CHOL/HDL Ratio: 3.5 RATIO
Triglycerides: 173 mg/dL — ABNORMAL HIGH (ref <150)
VLDL: 35 mg/dL (ref 0–40)

## 2020-09-17 MED ORDER — ROSUVASTATIN CALCIUM 20 MG PO TABS
40.0000 mg | ORAL_TABLET | Freq: Every evening | ORAL | Status: DC
  Administered 2020-09-17 – 2020-09-18 (×2): 40 mg via ORAL
  Filled 2020-09-17 (×2): qty 2

## 2020-09-17 MED ORDER — DIPHENHYDRAMINE HCL 50 MG/ML IJ SOLN: 50.0000 mg | Freq: Once | INTRAMUSCULAR | Status: AC

## 2020-09-17 MED ORDER — PREDNISONE 10 MG PO TABS
50.0000 mg | ORAL_TABLET | Freq: Four times a day (QID) | ORAL | Status: AC
  Administered 2020-09-17 – 2020-09-18 (×3): 50 mg via ORAL
  Filled 2020-09-17 (×4): qty 2

## 2020-09-17 MED ORDER — DIPHENHYDRAMINE HCL 50 MG/ML IJ SOLN: 50.0000 mg | Freq: Once | INTRAMUSCULAR | Status: DC

## 2020-09-17 MED ORDER — DIPHENHYDRAMINE HCL 25 MG PO CAPS
50.0000 mg | ORAL_CAPSULE | Freq: Once | ORAL | Status: AC
  Administered 2020-09-18: 50 mg via ORAL
  Filled 2020-09-17: qty 2

## 2020-09-17 MED ORDER — PERFLUTREN LIPID MICROSPHERE
1.0000 mL | INTRAVENOUS | Status: AC | PRN
  Filled 2020-09-17: qty 10

## 2020-09-17 MED ORDER — DIPHENHYDRAMINE HCL 25 MG PO CAPS
50.0000 mg | ORAL_CAPSULE | Freq: Once | ORAL | Status: DC
  Filled 2020-09-17: qty 2

## 2020-09-17 NOTE — Progress Notes (Signed)
VASCULAR LAB    Carotid duplex has been performed.  See CV proc for preliminary results.   Madelynne Lasker, RVT 09/17/2020, 9:24 AM

## 2020-09-17 NOTE — ED Notes (Signed)
Pt left for vascular.

## 2020-09-17 NOTE — Evaluation (Signed)
Occupational Therapy Evaluation and Discharge Patient Details Name: [name] MRN: [id number] DOB: 08-25-1945 Today's Date: 09/17/2020    History of Present Illness [name] is a 75 y.o. female with medical history significant of CAD with CABG x5 in 11/2014,  multiple TIA on plavix and ASA, diastolic & systolic  CHF, chronic hypoxic respiratory failure on 3L oxygen via [place], CKD stage III, T2DM, HTN, HLD, OSA, morbid obesity, biventricular ICD who presented to ER for acute onset left sided weakness and left facial droop. Testing showed high-grade right ICA stenosis (80 to 99%); CT of head--negative.   Clinical Impression   This 75 yo female admitted with above presents to acute OT at an independent/Mod I level for all basic ADLs at this time. No further OT needs, we will sign off.    Follow Up Recommendations  No OT follow up    Equipment Recommendations  None recommended by OT           Precautions / Restrictions Precautions Precaution Comments: monitor O2 (stayed above 95% on 3 liters) Restrictions Weight Bearing Restrictions: No      Mobility Bed Mobility Overal bed mobility: Independent                  Transfers Overall transfer level: Independent Equipment used: None                  Balance Overall balance assessment: Mild deficits observed, not formally tested                                         ADL either performed or assessed with clinical judgement   ADL Overall ADL's : Modified independent                                             Vision Patient Visual Report: No change from baseline              Pertinent Vitals/Pain Pain Assessment: No/denies pain     Hand Dominance Right   Extremity/Trunk Assessment Upper Extremity Assessment Upper Extremity Assessment: Overall WFL for tasks assessed           Communication Communication Communication: No difficulties   Cognition  Arousal/Alertness: Awake/alert Behavior During Therapy: WFL for tasks assessed/performed Overall Cognitive Status: Within Functional Limits for tasks assessed                                                Home Living Family/patient expects to be discharged to:: Private residence Living Arrangements: Children (son and girlfriend) Available Help at Discharge: Available 24 hours/day;Family Type of Home: Mobile home Home Access: Stairs to enter Entrance Stairs-Number of Steps: 3 Entrance Stairs-Rails: Left (as you go up) Home Layout: One level     Bathroom Shower/Tub: Teacher, early years/pre: Handicapped height     Home Equipment: Environmental consultant - 4 wheels;Tub bench;Hand held shower head   Additional Comments: 3 L of O2 at home      Prior Functioning/Environment Level of Independence: Independent        Comments: Does some IADLs  OT Goals(Current goals can be found in the care plan section) Acute Rehab OT Goals Patient Stated Goal: to get tests done and go home   AM-PAC OT "6 Clicks" Daily Activity     Outcome Measure Help from another person eating meals?: None Help from another person taking care of personal grooming?: None Help from another person toileting, which includes using toliet, bedpan, or urinal?: None Help from another person bathing (including washing, rinsing, drying)?: None Help from another person to put on and taking off regular upper body clothing?: None Help from another person to put on and taking off regular lower body clothing?: None 6 Click Score: 24   End of Session    Activity Tolerance: Patient tolerated treatment well Patient left: in chair;with call bell/phone within reach  OT Visit Diagnosis: Unsteadiness on feet (R26.81)                Time: 0413-6438 OT Time Calculation (min): 30 min Charges:  OT General Charges $OT Visit: 1 Visit OT Evaluation $OT Eval Moderate Complexity: 1 Mod OT  Treatments $Self Care/Home Management : 8-22 mins  [place], OTR/L Acute Rehab Services Pager [phone] Office [phone]    Almon Register 09/17/2020, 5:39 PM

## 2020-09-17 NOTE — Progress Notes (Addendum)
STROKE TEAM PROGRESS NOTE   ATTENDING NOTE: I reviewed above note and agree with the assessment and plan. Pt was seen and examined.   75 year old female with history of hypertension, hyperlipidemia, OSA, obesity, CAD status post CABG, AICD placement presenting to ED for left lower extremity weakness, left facial droop, slurred speech and aphasia.  Patient had a stroke in 10/2015 with left sided weakness and numbness, lasted 30 minutes and resolved.  CT no acute abnormality.  Notable MRI due to AICD.  Not able to have a CTA head and neck due to iodine allergy.  Carotid Doppler showed left ICA 40 to 59% stenosis, however not able to insonate right ICA.  TCD no window.  EF 55 to 60%, LDL 167, A1c 6.5, patient discharged with Plavix and Lipitor 80.  Over time, patient has no repeat carotid Doppler to evaluate right ICA.  On this admission, CT no acute abnormality.  Not able to do CTA head and neck again due to iodine allergy, not able to do MRI due to AICD.  AICD interrogation showed no A. fib.  However, carotid Doppler this time showed left ICA 40 to 59% stenosis but right ICA 80 to 99% stenosis.  TCD pending.  EF 55 to 60%.  LDL 90, A1c 6.5.  Creatinine 1.12.  Repeat head CT no acute abnormality.  On exam, patient neurologically intact except slight left nasolabial fold flattening and left upper extremity mild pronator drift. Etiology for patient symptoms likely due to right ICA high-grade stenosis.  Vascular surgery consulted.  Dr. Carlis Abbott would like to have CT head and neck for further evaluation before deciding on revascularization approaches.  She will be premedicated for the test.  Continue aspirin 81 and Plavix 75 DAPT and increase Crestor from 20 to 40.  Further antiplatelet regimen per vascular surgery.  Will follow.  For detailed assessment and plan, please refer to above as I have made changes wherever appropriate.   Rosalin Hawking, MD PhD Stroke Neurology 09/17/2020 6:31 PM    INTERVAL  HISTORY No family at bedside. Patient in bed on 4L oxygen an increase from her baseline of 3L. NAD.   Vitals:   09/17/20 0646 09/17/20 0700 09/17/20 0715 09/17/20 0730  BP:  (!) 162/74 (!) 156/66 (!) 149/59  Pulse:  65 66 65  Resp:  (!) 21 19 (!) 23  Temp: (!) 97.5 F (36.4 C)     TempSrc: Oral     SpO2:  99% 98% 98%  Weight:       CBC:  Recent Labs  Lab 09/16/20 2059 09/17/20 0500  WBC 5.9 6.2  NEUTROABS 4.2 4.6  HGB 12.0 13.2  HCT 40.3 45.1  MCV 100.8* 100.0  PLT 104* 878*   Basic Metabolic Panel:  Recent Labs  Lab 09/16/20 1441 09/16/20 1447 09/17/20 0500  NA 138 139 136  K 4.5 4.6 4.3  CL 97* 95* 99  CO2 35*  --  26  GLUCOSE 128* 125* 127*  BUN 18 23 15   CREATININE 1.29* 1.20* 1.12*  CALCIUM 9.1  --  9.1   Lipid Panel:  Recent Labs  Lab 09/17/20 0500  CHOL 175  TRIG 173*  HDL 50  CHOLHDL 3.5  VLDL 35  LDLCALC 90   HgbA1c:  Recent Labs  Lab 09/17/20 0500  HGBA1C 6.5*    IMAGING past 24 hours DG Chest Portable 1 View  Result Date: 09/16/2020 CLINICAL DATA:  chest pain EXAM: PORTABLE CHEST 1 VIEW COMPARISON:  April 01, 2020.  FINDINGS: Enlarged cardiac silhouette. Central pulmonary vascular congestion. Tortuous aorta. Median sternotomy and CABG. Calcific atherosclerosis aorta. Bibasilar and right midlung opacities. Possible trace fluid layering along the right minor fissure. Otherwise, no visible pleural effusions or pneumothorax on this single semi erect radiograph. IMPRESSION: 1. Cardiomegaly and central pulmonary vascular congestion. Possible trace fluid layering along the right minor fissure. 2. Low lung volumes with right midlung and bibasilar opacities, most likely atelectasis. Infection is not excluded. Electronically Signed   By: Margaretha Sheffield MD   On: 09/16/2020 15:47   CT HEAD CODE STROKE WO CONTRAST  Result Date: 09/16/2020 CLINICAL DATA:  Code stroke. Neuro deficit, acute, stroke suspected. Facial droop, slurred speech, and  generalized weakness. EXAM: CT HEAD WITHOUT CONTRAST TECHNIQUE: Contiguous axial images were obtained from the base of the skull through the vertex without intravenous contrast. COMPARISON:  04/01/2020 FINDINGS: Brain: There is no evidence of an acute infarct, intracranial hemorrhage, mass, midline shift, or extra-axial fluid collection. Mild cerebral atrophy is within normal limits for age. Hypodensities in the cerebral white matter bilaterally are unchanged and nonspecific but compatible with mild chronic small vessel ischemic disease. There are several chronic punctate calcifications projecting over the left cerebral convexity which may be within sulci and vascular. Vascular: Calcified atherosclerosis at the skull base. No hyperdense vessel. Skull: No fracture or suspicious osseous lesion. Sinuses/Orbits: Paranasal sinuses and mastoid air cells are clear. Bilateral cataract extraction. Other: None. ASPECTS [facility] Stroke Program Early CT Score) - Ganglionic level infarction (caudate, lentiform nuclei, internal capsule, insula, M1-M3 cortex): 7 - Supraganglionic infarction (M4-M6 cortex): 3 Total score (0-10 with 10 being normal): 10 IMPRESSION: 1. No evidence of acute intracranial abnormality. 2. ASPECTS is 10. 3. Mild chronic small vessel ischemic disease. Code stroke imaging results were communicated on 09/16/2020 at 2:57 pm to Dr. Lorrin Goodell via telephone. Electronically Signed   By: Logan Bores M.D.   On: 09/16/2020 14:57    PHYSICAL EXAM  Physical Exam  Constitutional: Appears well-developed and well-nourished.  Psych: Affect appropriate to situation Eyes: Normal external eye and conjunctiva. HENT: Normocephalic, no lesions, without obvious abnormality.   Musculoskeletal-no joint tenderness, deformity or swelling Cardiovascular: Normal rate and regular rhythm.  Respiratory: Effort normal, non-labored breathing saturations WNL GI: Soft.  No distension. There is no tenderness.  Skin:  WDI   Neuro:  Mental Status: Alert, oriented, thought content appropriate.  Speech fluent without evidence of aphasia.  Able to follow commands without difficulty. Cranial Nerves: II: Visual fields grossly normal,  III,IV, VI: ptosis not present, extra-ocular motions intact bilaterally pupils equal, round, reactive to light and accommodation V,VII: smile symmetric, facial light touch sensation normal bilaterally VIII: hearing normal bilaterally IX,X: uvula rises symmetrically XI: bilateral shoulder shrug XII: midline tongue extension Motor: Right : Upper extremity   5/5  Left:     Upper extremity   5/5  Lower extremity   5/5   Lower extremity   5/5 Tone and bulk:normal tone throughout; no atrophy noted Sensory: light touch intact throughout, bilaterally Cerebellar: normal finger-to-nose Gait: deferred    ASSESSMENT/PLAN Ms. DENEANE STIFTER is a 75 y.o. female PMH significant for cardiomyopathy status post AICD placement, prior TIAs, diabetes, hypercholesterolemia, hypertension, obstructive sleep apnea who woke up at 0700 and 09/16/2020 and was at her baseline.  At 0900, she had abrupt onset left lower extremity weakness.  When her symptoms did not improve, she eventually called EMS.  Per EMS, she was noted to have a facial droop, aphasia, funny feeling  and slurring of her speech.  Patient denied all of these symptoms to me and just endorsed left leg weakness.  Stroke vs TIA:  r/t  to right ICA stenosis 80-99%. Code Stroke :1. No evidence of acute intracranial abnormality. 2. ASPECTS is 10. 3. Mild chronic small vessel ischemic disease. CTA head & neck unable d/t contrast allergy MRI  unable d/t ICD Transcranial doppler: pending Carotid Doppler  pending 2D Echo pending LDL 90 HgbA1c 6.5 VTE prophylaxis - lovenox    Diet   Diet heart healthy/carb modified Room service appropriate? Yes; Fluid consistency: Thin   aspirin 81 mg daily and clopidogrel 75 mg daily prior to  admission, now on aspirin 81 mg daily and clopidogrel 75 mg daily.  Therapy recommendations:  pending Disposition:  pending  Hypertension Home meds:  torsemide Stable Permissive hypertension (OK if < 220/120) but gradually normalize in 5-7 days Long-term BP goal normotensive  Hyperlipidemia Home meds:  crestor 20 mg, LDL 90, goal < 70 Increase crestor to 40mg  daily ( if pt. Can tolerate); if not go back to 20 mg daily Patient with documented leg cramps with atorvastatin. Continue statin at discharge  Diabetes type II  Home meds:  jardiance HgbA1c 6.5, goal < 7.0 CBGs Recent Labs    09/16/20 1441 09/16/20 2153  GLUCAP 122* 111*     Other Stroke Risk Factors Advanced Age >/= 70  Obesity, Body mass index is 43.91 kg/m., BMI >/= 30 associated with increased stroke risk, recommend weight loss, diet and exercise as appropriate  Hx TIA Coronary artery disease Obstructive sleep apnea, not on CPAP at home "can't tolerate mask" Congestive heart failure   Other risk factors Right carotid artery stenosis: 80-99% occlusion: consult vascular surgery cardiology to interrogate ICD; per cardiology no evidence of a. Fib.  Hospital day # 0  Laurey Morale, MSN, NP-C [facility] [phone]   To contact Stroke Continuity provider, please refer to [email or web address] After hours, contact General Neurology

## 2020-09-17 NOTE — ED Notes (Signed)
Pt requesting breathing treatment - will administer per PRN orders

## 2020-09-17 NOTE — ED Notes (Signed)
Only 500mg  of Ranexa sent - pharmacy notified and going to send more

## 2020-09-17 NOTE — Progress Notes (Addendum)
Progress Note    [name]  HAL:[id number] DOB: Aug 25, 1945  DOA: 09/16/2020 PCP: Pcp, No      Brief Narrative:    Medical records reviewed and are as summarized below:  [name] is a 75 y.o. female  with medical history significant of CAD with CABG x5 in 11/2014,  multiple TIA on plavix and ASA, diastolic & systolic  CHF, chronic hypoxic respiratory failure on 3L oxygen via [place], CKD stage III, T2DM, HTN, HLD, OSA, morbid obesity, biventricular ICD who presented to ER for acute onset left sided weakness and left facial droop, who presented to the hospital because of left-sided weakness, left facial droop and slurred speech that started around 9 AM on 09/16/2020.  CT head did not show any evidence of acute stroke.  However carotid duplex showed high-grade right ICA stenosis (80 to 99%).    Assessment/Plan:   Principal Problem:   Acute left-sided weakness Active Problems:   Essential hypertension   Asthma   Cardiac resynchronization therapy pacemaker (CRT-P) in place   Gastroesophageal reflux disease   OSA (obstructive sleep apnea)   Chronic diastolic CHF (congestive heart failure) (HCC)   History of TIA (transient ischemic attack)   CKD (chronic kidney disease), stage III (HCC)   CAD (coronary artery disease)   DM2 (diabetes mellitus, type 2) (HCC)   Chronic respiratory failure with hypoxia and hypercapnia (HCC)   Chest pain    Body mass index is 43.91 kg/m.    Suspected acute stroke with left-sided weakness and slurred speech, high-grade right ICA stenosis (80 to 99%): Continue aspirin, Plavix and rosuvastatin.  Repeat CT head today.  Consulted vascular surgeon for further evaluation.  Follow-up with neurologist.  Hypertensive urgency, hypertension: Allow for permissive hypertension.  Chest pain with mildly elevated troponins, CAD with history of CABG: Chest pain has improved.  Elevated troponins likely from demand ischemia.  Continue antiplatelets,  rosuvastatin and ranolazine.  Chronic systolic and diastolic CHF, ICD in place: Compensated  COPD with chronic hypoxic and hypercapnic respiratory failure: Continue bronchodilators and 3 L per minute oxygen via nasal cannula.  Other comorbidities include type II DM, CKD stage IIIa, history of TIAs, OSA, morbid obesity, GERD, asthma   Diet Order             Diet heart healthy/carb modified Room service appropriate? Yes; Fluid consistency: Thin  Diet effective now                      Consultants: Neurologist Vascular surgeon  Procedures: None    Medications:    allopurinol  100 mg Oral BID   aspirin EC  81 mg Oral Daily   clopidogrel  75 mg Oral QHS   cycloSPORINE  1 drop Both Eyes Q12H   empagliflozin  10 mg Oral QAC breakfast   enoxaparin (LOVENOX) injection  40 mg Subcutaneous q1600   ferrous sulfate  325 mg Oral Q breakfast   fluticasone  1 spray Each Nare Daily   guaiFENesin  600 mg Oral BID   insulin aspart  0-9 Units Subcutaneous TID WC   isosorbide mononitrate  90 mg Oral Daily   loratadine  10 mg Oral Daily   mometasone-formoterol  2 puff Inhalation BID   multivitamin with minerals  1 tablet Oral q morning   pantoprazole  40 mg Oral BID   polyvinyl alcohol  1 drop Both Eyes TID   potassium chloride SA  20 mEq Oral q  morning   ranolazine  1,000 mg Oral BID   rosuvastatin  40 mg Oral QPM   torsemide  20 mg Oral BID WC   Continuous Infusions:   Anti-infectives (From admission, onward)    None              Family Communication/Anticipated D/C date and plan/Code Status   DVT prophylaxis: enoxaparin (LOVENOX) injection 40 mg Start: 09/17/20 1600     Code Status: Full Code  Family Communication: None Disposition Plan:    Status is: Observation  The patient will require care spanning > 2 midnights and should be moved to inpatient because: Inpatient level of care appropriate due to severity of illness  Dispo: The patient is from:  Home              Anticipated d/c is to: Home              Patient currently is not medically stable to d/c.   Difficult to place patient No           Subjective:   Interval events noted.  Her speech is better.  Left-sided weakness has improved but she still feels weak in the left leg.  She complains of difficulty swallowing  Objective:    Vitals:   09/17/20 1030 09/17/20 1045 09/17/20 1100 09/17/20 1111  BP: (!) 183/84 (!) 167/80 (!) 188/71   Pulse: 71 70 68   Resp: (!) 23 18 17    Temp:    98.1 F (36.7 C)  TempSrc:    Oral  SpO2: 100% 99% 99%   Weight:       No data found.  No intake or output data in the 24 hours ending 09/17/20 1156 Filed Weights   09/16/20 1800  Weight: 108.9 kg    Exam:  GEN: NAD SKIN: Warm and dry EYES: No pallor or icterus ENT: MMM CV: RRR PULM: CTA B ABD: soft, obese, NT, +BS CNS: AAO x 3, power LUE 4+/5. LLE 3/5 EXT: No edema or tenderness        Data Reviewed:   I have personally reviewed following labs and imaging studies:  Labs: Labs show the following:   Basic Metabolic Panel: Recent Labs  Lab 09/16/20 1441 09/16/20 1447 09/17/20 0500  NA 138 139 136  K 4.5 4.6 4.3  CL 97* 95* 99  CO2 35*  --  26  GLUCOSE 128* 125* 127*  BUN 18 23 15   CREATININE 1.29* 1.20* 1.12*  CALCIUM 9.1  --  9.1   GFR Estimated Creatinine Clearance: 49.7 mL/min (A) (by C-G formula based on SCr of 1.12 mg/dL (H)). Liver Function Tests: Recent Labs  Lab 09/16/20 1441  AST 19  ALT 12  ALKPHOS 99  BILITOT 0.8  PROT 6.4*  ALBUMIN 3.1*   No results for input(s): LIPASE, AMYLASE in the last 168 hours. No results for input(s): AMMONIA in the last 168 hours. Coagulation profile Recent Labs  Lab 09/16/20 1441 09/16/20 1855  INR SPECIMEN CLOTTED 1.0    CBC: Recent Labs  Lab 09/16/20 1441 09/16/20 1447 09/16/20 2059 09/17/20 0500  WBC 7.1  --  5.9 6.2  NEUTROABS 5.2  --  4.2 4.6  HGB 13.1 15.0 12.0 13.2  HCT 43.3  44.0 40.3 45.1  MCV 98.6  --  100.8* 100.0  PLT 106*  --  104* 138*   Cardiac Enzymes: No results for input(s): CKTOTAL, CKMB, CKMBINDEX, TROPONINI in the last 168 hours. BNP (last 3 results)  No results for input(s): PROBNP in the last 8760 hours. CBG: Recent Labs  Lab 09/16/20 1441 09/16/20 2153 09/17/20 1022  GLUCAP 122* 111* 118*   D-Dimer: No results for input(s): DDIMER in the last 72 hours. Hgb A1c: Recent Labs    09/17/20 0500  HGBA1C 6.5*   Lipid Profile: Recent Labs    09/17/20 0500  CHOL 175  HDL 50  LDLCALC 90  TRIG 173*  CHOLHDL 3.5   Thyroid function studies: No results for input(s): TSH, T4TOTAL, T3FREE, THYROIDAB in the last 72 hours.  Invalid input(s): FREET3 Anemia work up: No results for input(s): VITAMINB12, FOLATE, FERRITIN, TIBC, IRON, RETICCTPCT in the last 72 hours. Sepsis Labs: Recent Labs  Lab 09/16/20 1441 09/16/20 2059 09/17/20 0500  WBC 7.1 5.9 6.2    Microbiology Recent Results (from the past 240 hour(s))  Resp Panel by RT-PCR (Flu A&B, Covid) Nasopharyngeal Swab     Status: None   Collection Time: 09/16/20  2:31 PM   Specimen: Nasopharyngeal Swab; Nasopharyngeal(NP) swabs in vial transport medium  Result Value Ref Range Status   SARS Coronavirus 2 by RT PCR NEGATIVE NEGATIVE Final    Comment: (NOTE) SARS-CoV-2 target nucleic acids are NOT DETECTED.  The SARS-CoV-2 RNA is generally detectable in upper respiratory specimens during the acute phase of infection. The lowest concentration of SARS-CoV-2 viral copies this assay can detect is 138 copies/mL. A negative result does not preclude SARS-Cov-2 infection and should not be used as the sole basis for treatment or other patient management decisions. A negative result may occur with  improper specimen collection/handling, submission of specimen other than nasopharyngeal swab, presence of viral mutation(s) within the areas targeted by this assay, and inadequate number of  viral copies(<138 copies/mL). A negative result must be combined with clinical observations, patient history, and epidemiological information. The expected result is Negative.  Fact Sheet for Patients:  EntrepreneurPulse.com.au  Fact Sheet for Healthcare Providers:  IncredibleEmployment.be  This test is no t yet approved or cleared by the [place] FDA and  has been authorized for detection and/or diagnosis of SARS-CoV-2 by FDA under an Emergency Use Authorization (EUA). This EUA will remain  in effect (meaning this test can be used) for the duration of the COVID-19 declaration under Section 564(b)(1) of the Act, 21 U.S.C.section 360bbb-3(b)(1), unless the authorization is terminated  or revoked sooner.       Influenza A by PCR NEGATIVE NEGATIVE Final   Influenza B by PCR NEGATIVE NEGATIVE Final    Comment: (NOTE) The Xpert Xpress SARS-CoV-2/FLU/RSV plus assay is intended as an aid in the diagnosis of influenza from Nasopharyngeal swab specimens and should not be used as a sole basis for treatment. Nasal washings and aspirates are unacceptable for Xpert Xpress SARS-CoV-2/FLU/RSV testing.  Fact Sheet for Patients: EntrepreneurPulse.com.au  Fact Sheet for Healthcare Providers: IncredibleEmployment.be  This test is not yet approved or cleared by the [place] FDA and has been authorized for detection and/or diagnosis of SARS-CoV-2 by FDA under an Emergency Use Authorization (EUA). This EUA will remain in effect (meaning this test can be used) for the duration of the COVID-19 declaration under Section 564(b)(1) of the Act, 21 U.S.C. section 360bbb-3(b)(1), unless the authorization is terminated or revoked.  Performed at [facility], [place] [address] [place], [place] [address]     Procedures and diagnostic studies:  DG Chest Portable 1 View  Result Date: 09/16/2020 CLINICAL DATA:  chest  pain EXAM: PORTABLE CHEST 1 VIEW COMPARISON:  April 01, 2020. FINDINGS: Enlarged cardiac silhouette. Central pulmonary vascular congestion. Tortuous aorta. Median sternotomy and CABG. Calcific atherosclerosis aorta. Bibasilar and right midlung opacities. Possible trace fluid layering along the right minor fissure. Otherwise, no visible pleural effusions or pneumothorax on this single semi erect radiograph. IMPRESSION: 1. Cardiomegaly and central pulmonary vascular congestion. Possible trace fluid layering along the right minor fissure. 2. Low lung volumes with right midlung and bibasilar opacities, most likely atelectasis. Infection is not excluded. Electronically Signed   By: Margaretha Sheffield MD   On: 09/16/2020 15:47   CT HEAD CODE STROKE WO CONTRAST  Result Date: 09/16/2020 CLINICAL DATA:  Code stroke. Neuro deficit, acute, stroke suspected. Facial droop, slurred speech, and generalized weakness. EXAM: CT HEAD WITHOUT CONTRAST TECHNIQUE: Contiguous axial images were obtained from the base of the skull through the vertex without intravenous contrast. COMPARISON:  04/01/2020 FINDINGS: Brain: There is no evidence of an acute infarct, intracranial hemorrhage, mass, midline shift, or extra-axial fluid collection. Mild cerebral atrophy is within normal limits for age. Hypodensities in the cerebral white matter bilaterally are unchanged and nonspecific but compatible with mild chronic small vessel ischemic disease. There are several chronic punctate calcifications projecting over the left cerebral convexity which may be within sulci and vascular. Vascular: Calcified atherosclerosis at the skull base. No hyperdense vessel. Skull: No fracture or suspicious osseous lesion. Sinuses/Orbits: Paranasal sinuses and mastoid air cells are clear. Bilateral cataract extraction. Other: None. ASPECTS [facility] Stroke Program Early CT Score) - Ganglionic level infarction (caudate, lentiform nuclei, internal capsule, insula,  M1-M3 cortex): 7 - Supraganglionic infarction (M4-M6 cortex): 3 Total score (0-10 with 10 being normal): 10 IMPRESSION: 1. No evidence of acute intracranial abnormality. 2. ASPECTS is 10. 3. Mild chronic small vessel ischemic disease. Code stroke imaging results were communicated on 09/16/2020 at 2:57 pm to Dr. Lorrin Goodell via telephone. Electronically Signed   By: Logan Bores M.D.   On: 09/16/2020 14:57   VAS US CAROTID (at [facility] and [facility] only)  Result Date: 09/17/2020 Carotid Arterial Duplex Study Patient Name:  [name]  Date of Exam:   09/17/2020 Medical Rec #: [id number]          Accession #:    [id number] Date of Birth: April 13, 1945           Patient Gender: F Patient Age:   89 years Exam Location:  [facility] Procedure:      VAS US CAROTID Referring Phys: Orma Flaming --------------------------------------------------------------------------------  Indications:       Weakness. Risk Factors:      Hypertension, hyperlipidemia, Diabetes, prior CVA. Other Factors:     OSA, morbid obesity, atria fibrillation, cardiomyopathy,                    AICD. Limitations        Today's exam was limited due to the body habitus of the                    patient and depth of vessels. Comparison Study:  Prior study done 10/24/15 Performing Technologist: Sharion Dove RVS  Examination Guidelines: A complete evaluation includes B-mode imaging, spectral Doppler, color Doppler, and power Doppler as needed of all accessible portions of each vessel. Bilateral testing is considered an integral part of a complete examination. Limited examinations for reoccurring indications may be performed as noted.  Right Carotid Findings: +----------+--------+--------+--------+------------------+------------------+           PSV cm/sEDV cm/sStenosisPlaque DescriptionComments           +----------+--------+--------+--------+------------------+------------------+  CCA Prox  97      23                                intimal  thickening +----------+--------+--------+--------+------------------+------------------+ CCA Distal64      24                                intimal thickening +----------+--------+--------+--------+------------------+------------------+ ICA Prox  532     177     80-99%  heterogenous      tortuous           +----------+--------+--------+--------+------------------+------------------+ ICA Distal73      22                                tortuous           +----------+--------+--------+--------+------------------+------------------+ ECA       372     46      >50%                                         +----------+--------+--------+--------+------------------+------------------+ +----------+--------+-------+--------+-------------------+           PSV cm/sEDV cmsDescribeArm Pressure (mmHG) +----------+--------+-------+--------+-------------------+ [id number]                                         +----------+--------+-------+--------+-------------------+ +---------+--------+--------+--------------+ VertebralPSV cm/sEDV cm/sNot identified +---------+--------+--------+--------------+  Left Carotid Findings: +----------+--------+--------+--------+------------------+------------------+           PSV cm/sEDV cm/sStenosisPlaque DescriptionComments           +----------+--------+--------+--------+------------------+------------------+ CCA Prox  63      16                                intimal thickening +----------+--------+--------+--------+------------------+------------------+ CCA Distal100     26                                intimal thickening +----------+--------+--------+--------+------------------+------------------+ ICA Prox  204     56      40-59%  heterogenous      tortuous           +----------+--------+--------+--------+------------------+------------------+ ICA Distal157     64                                tortuous            +----------+--------+--------+--------+------------------+------------------+ ECA       147     17                                                   +----------+--------+--------+--------+------------------+------------------+ +----------+--------+--------+--------+-------------------+           PSV cm/sEDV cm/sDescribeArm Pressure (mmHG) +----------+--------+--------+--------+-------------------+ [id number]                                          +----------+--------+--------+--------+-------------------+ +---------+--------+--------+--------------+  [place] cm/sEDV cm/sNot identified +---------+--------+--------+--------------+   Summary: Right Carotid: Velocities in the right ICA are consistent with a 80-99%                stenosis. Marked Increase in severity of stenosis since study                done 10/24/15. Left Carotid: Velocities in the left ICA are consistent with a 40-59% stenosis.               Stable since study done 10/24/15. Vertebrals:  Not assessed. Subclavians: Normal flow hemodynamics were seen in bilateral subclavian              arteries. *See table(s) above for measurements and observations.     Preliminary    VAS [place] TRANSCRANIAL DOPPLER  Result Date: 09/17/2020  Transcranial Doppler Patient Name:  [name]  Date of Exam:   09/17/2020 Medical Rec #: [id number]          Accession #:    [id number] Date of Birth: 28-Jul-1945           Patient Gender: F Patient Age:   35 years Exam Location:  [facility] Procedure:      VAS [place] TRANSCRANIAL DOPPLER Referring Phys: Orma Flaming --------------------------------------------------------------------------------  Indications: Stroke. History: History of TIA, CVA, carotid stenosis. Limitations for diagnostic windows: Unable to insonate right transtemporal window. Unable to insonate left transtemporal window. Comparison Study: Prior TCD done 10/24/15 Performing Technologist: Sharion Dove RVS Supporting  Technologist: Darlin Coco RDMS,RVT  Examination Guidelines: A complete evaluation includes B-mode imaging, spectral Doppler, color Doppler, and power Doppler as needed of all accessible portions of each vessel. Bilateral testing is considered an integral part of a complete examination. Limited examinations for reoccurring indications may be performed as noted.  +----------+-------------+----------+-----------+-------+ RIGHT TCD Right VM (cm)Depth (cm)PulsatilityComment +----------+-------------+----------+-----------+-------+ ACA          -21.00                 1.07            +----------+-------------+----------+-----------+-------+ Opthalmic     20.00                 0.86            +----------+-------------+----------+-----------+-------+ ICA siphon    35.00                 1.26            +----------+-------------+----------+-----------+-------+ Vertebral    -15.00                 1.24            +----------+-------------+----------+-----------+-------+  +----------+------------+----------+-----------+-------+ LEFT TCD  Left VM (cm)Depth (cm)PulsatilityComment +----------+------------+----------+-----------+-------+ Opthalmic    14.00                 1.28            +----------+------------+----------+-----------+-------+ ICA siphon   19.00                 1.22            +----------+------------+----------+-----------+-------+ Vertebral    -17.00                0.85            +----------+------------+----------+-----------+-------+  +------------+------+-------+             VM cm Comment +------------+------+-------+ Prox Basilar-29.00        +------------+------+-------+  Preliminary                LOS: 0 days   Domitila Stetler  [facility]   Pager on [email or web address]. If 7PM-7AM, please contact night-coverage at [email or web address]     09/17/2020, 11:56 AM

## 2020-09-17 NOTE — Consult Note (Addendum)
Hospital Consult    Reason for Consult: Right CVA versus TIA, critical right ICA stenosis Requesting Physician: Dr. Mal Misty MRN #:  [id number]  History of Present Illness: This is a 75 y.o. female with past medical history significant for cardiomyopathy status post AICD, CAD with history of CABG and stenting, type 2 diabetes mellitus, hypertension, hyper lipidemia.  She is being evaluated for CVA versus TIA causing left sided facial weakness.  Symptom onset occurred yesterday morning.  She believes her left face is back to baseline.  She denies any further one-sided weakness, slurring speech, or drastic vision changes.  She denies any history of claudication, rest pain, or nonhealing wounds of bilateral lower extremities.  Work-up included carotid duplex demonstrating 80 to 99% stenosis of right ICA.  Patient states she has a anaphylactic reaction to contrast and would prefer not to receive any contrast.  She has never been premedicated to receive contrast after learning of allergy about 5 years ago.  His cardiologist is Dr. [name].  Past Medical History:  Diagnosis Date   ABDOMINAL PAIN -GENERALIZED 02/21/2010   Qualifier: Diagnosis of  By: Chester Holstein NP, Nevin Bloodgood     AICD (automatic cardioverter/defibrillator) present    Anemia    Arthritis    Asthma    Atrial fibrillation ([place]) 10/24/2015   Questionable history of A Fib/A Flutter. On device interrogation on 9/7, showed 0.24min of A Fib/A Flutter with 1% burden.  Device check in Jan 2018 showed no sustained AF (runs of less than 30 seconds). No anticoagulation indicated.   CAD (coronary artery disease)    a. 10/09/16 LHC: SVG->LAD patent, SVG->Diag patent, SVG->RCA patent, SVG->LCx occluded. EF 60%, b. 10/31/16 LHC DES to AV groove Circ, DES to intermed branch   Cellulitis and abscess of foot 12/2014   RT FOOT   CHF (congestive heart failure) (HCC)    Chronic bronchitis (HCC)    Chronic lower back pain    Complete heart block ([place]) 06/22/2015    Diverticulosis    Facial numbness 02/2016   Fatty liver disease, nonalcoholic    Gastritis    GERD (gastroesophageal reflux disease)    Gout    H/O hiatal hernia    HEMORRHOIDS-EXTERNAL 02/21/2010   Qualifier: Diagnosis of  By: Chester Holstein NP, Paula     High cholesterol    Hyperplastic colon polyp    Hypertension    IBS (irritable bowel syndrome)    Internal hemorrhoids    INTERNAL HEMORRHOIDS WITHOUT MENTION COMP 04/12/2007   Qualifier: Diagnosis of  By: Olevia Perches MD, Lowella Bandy    Ischemic cardiomyopathy    Nonischemic cardiomyopathy ([place]) 11/22/2011   Pt responded to BiV ICD- last EF 55-60% Sept 2017   Obesity    On home oxygen therapy    "2L at night" (10/31/2016)   OSA (obstructive sleep apnea)    "can't tolerate a mask" (10/30/2016)   PERSONAL HX COLONIC POLYPS 02/21/2010   Qualifier: Diagnosis of  By: Chester Holstein NP, Paula     Pneumonia    "couple times" (10/31/2016)   Right facial numbness 03/05/2016   Shortness of breath    TIA (transient ischemic attack)    "recently" (10/31/2016)   Type II diabetes mellitus ([place])     Past Surgical History:  Procedure Laterality Date   ABDOMINAL ULTRASOUND  12/01/2011   Peripelvic cysts- #1- 2.4x1.9x2.3cm, #2-1.2x0.9x1.2cm   ANKLE FRACTURE SURGERY Right    "had rod put in"   ANTERIOR CERVICAL DECOMP/DISCECTOMY FUSION     APPENDECTOMY  BACK SURGERY     BIOPSY  12/29/2017   Procedure: BIOPSY;  Surgeon: [name], MD;  [facility]: [facility];  Service: Endoscopy;;   BIV ICD INSERTION CRT-D  2001?   BIV PACEMAKER GENERATOR CHANGE OUT N/A 11/02/2012   Procedure: BIV PACEMAKER GENERATOR CHANGE OUT;  Surgeon: Sanda Klein, MD;  Location: [facility];  Service: Cardiovascular;  Laterality: N/A;   CARDIAC CATHETERIZATION  05/17/1999   No significant coronary obstructive disease w/ mild 20% luminal irregularity of the first diag branch of the LAD   CARDIAC CATHETERIZATION  07/08/2002   No significant CAD, moderately depressed LV systolic  function   CARDIAC CATHETERIZATION Bilateral 04/26/2007   Normal findings, recommend medical therapy   CARDIAC CATHETERIZATION  02/18/2008   Moderate CAD, would benefit from having a functional study, recommend continue medical therapy   CARDIAC CATHETERIZATION  07/23/2012   Medical therapy   CARDIAC CATHETERIZATION N/A 11/24/2014   Procedure: Left Heart Cath and Coronary Angiography;  Surgeon: Troy Sine, MD;  Location: [facility];  Service: Cardiovascular;  Laterality: N/A;   CARDIAC CATHETERIZATION  11/27/2014   Procedure: Intravascular Pressure Wire/FFR Study;  Surgeon: Peter M Martinique, MD;  Location: [facility];  Service: Cardiovascular;;   CARDIAC CATHETERIZATION  10/09/2016   CHOLECYSTECTOMY N/A 04/09/2018   Procedure: LAPAROSCOPIC CHOLECYSTECTOMY;  Surgeon: Greer Pickerel, MD;  [facility]: [facility];  Service: General;  Laterality: N/A;   COLONOSCOPY WITH PROPOFOL N/A 12/29/2017   Procedure: COLONOSCOPY WITH PROPOFOL;  Surgeon: [name], MD;  [facility];  Service: Endoscopy;  Laterality: N/A;   CORONARY ANGIOGRAM  2010   CORONARY ARTERY BYPASS GRAFT N/A 11/29/2014   Procedure: CORONARY ARTERY BYPASS GRAFTING x 5 (LIMA-LAD, SVG-D, SVG-OM1-OM2, SVG-PD);  Surgeon: Melrose Nakayama, MD;  Location: [facility];  Service: Open Heart Surgery;  Laterality: N/A;   CORONARY STENT INTERVENTION N/A 10/31/2016   Procedure: CORONARY STENT INTERVENTION;  Surgeon: Burnell Blanks, MD;  Location: [facility];  Service: Cardiovascular;  Laterality: N/A;   ESOPHAGOGASTRODUODENOSCOPY (EGD) WITH PROPOFOL N/A 12/29/2017   Procedure: ESOPHAGOGASTRODUODENOSCOPY (EGD) WITH PROPOFOL;  Surgeon: [name], MD;  [facility];  Service: Endoscopy;  Laterality: N/A;   FRACTURE SURGERY     INSERT / REPLACE / [place] ARTHROSCOPY Bilateral    LEFT HEART CATH AND CORS/GRAFTS ANGIOGRAPHY N/A 12/09/2017   Procedure: LEFT HEART CATH AND  CORS/GRAFTS ANGIOGRAPHY;  Surgeon: Troy Sine, MD;  Location: [facility];  Service: Cardiovascular;  Laterality: N/A;   LEFT HEART CATHETERIZATION WITH CORONARY ANGIOGRAM N/A 07/23/2012   Procedure: LEFT HEART CATHETERIZATION WITH CORONARY ANGIOGRAM;  Surgeon: Leonie Man, MD;  Location: [facility] CATH LAB;  Service: Cardiovascular;  Laterality: N/A;   LEXISCAN MYOVIEW  11/14/2011   Mild-moderate defect seen in Mid Inferolateral and Mid Anterolateral regions-consistant w/ infarct/scar. No significant ischemia demonstrated.   POLYPECTOMY  12/29/2017   Procedure: POLYPECTOMY;  Surgeon: [name], MD;  [facility];  Service: Endoscopy;;   RIGHT/LEFT HEART CATH AND CORONARY ANGIOGRAPHY N/A 10/09/2016   Procedure: RIGHT/LEFT HEART CATH AND CORONARY ANGIOGRAPHY;  Surgeon: Jolaine Artist, MD;  Location: [place] [facility];  Service: Cardiovascular;  Laterality: N/A;   RIGHT/LEFT HEART CATH AND CORONARY/GRAFT ANGIOGRAPHY N/A 03/11/2019   Procedure: RIGHT/LEFT HEART CATH AND CORONARY/GRAFT ANGIOGRAPHY;  Surgeon: Jolaine Artist, MD;  Location: [facility];  Service: Cardiovascular;  Laterality: N/A;  TEE WITHOUT CARDIOVERSION N/A 11/29/2014   Procedure: TRANSESOPHAGEAL ECHOCARDIOGRAM (TEE);  Surgeon: Melrose Nakayama, MD;  Location: [facility];  Service: Open Heart Surgery;  Laterality: N/A;   TRANSTHORACIC ECHOCARDIOGRAM  07/23/2012   EF 55-60%, normal-mild   TUBAL LIGATION      Allergies  Allergen Reactions   Ivp Dye [Iodinated Diagnostic Agents] Shortness Of Breath    No reaction to PO contrast with non-ionic dye.06-25-2014/rsm   Shellfish Allergy Anaphylaxis   Sulfa Antibiotics Shortness Of Breath   Iodine Hives   Atorvastatin Other (See Comments)    Pt states "causes bilateral leg pain/cramps."   Benadryl [Diphenhydramine] Other (See Comments)    "THIS DRIVES ME CRAZY AND MAKES ME FEEL LIKE I AM DYING"   Colchicine Nausea And Vomiting   Contrast  Media [Iodinated Diagnostic Agents] Hives   Doxycycline Other (See Comments)    Unknown reaction, per patient   Uloric [Febuxostat] Other (See Comments)    Unknown reaction   Cephalexin Itching and Rash   Zithromax [Azithromycin] Rash    Prior to Admission medications   Medication Sig Start Date End Date Taking? Authorizing Provider  acetaminophen (TYLENOL) 500 MG tablet Take 1,000 mg by mouth 2 (two) times daily as needed (for pain or headaches).   Yes [provider]  albuterol (PROVENTIL HFA;VENTOLIN HFA) 108 (90 Base) MCG/ACT inhaler Inhale 2 puffs into the lungs every 6 (six) hours as needed for wheezing or shortness of breath. Patient taking differently: Inhale 2 puffs into the lungs every 4 (four) hours as needed for wheezing or shortness of breath. 05/02/18  Yes Fuller Plan A, MD  albuterol (PROVENTIL) (2.5 MG/3ML) 0.083% nebulizer solution Take 3 mLs (2.5 mg total) by nebulization every 6 (six) hours as needed for wheezing or shortness of breath. 05/02/18  Yes Fuller Plan A, MD  allopurinol (ZYLOPRIM) 100 MG tablet TAKE 1 TABLET BY MOUTH  DAILY Patient taking differently: Take 100 mg by mouth 2 (two) times daily. 04/23/20  Yes Croitoru, [name], MD  aspirin EC 81 MG tablet Take 1 tablet (81 mg total) by mouth daily. 03/27/16  Yes Kilroy, Luke K, PA-C  budesonide-formoterol (SYMBICORT) 160-4.5 MCG/ACT inhaler Inhale 2 puffs into the lungs 2 (two) times daily.   Yes [provider]  carboxymethylcellulose (REFRESH PLUS) 0.5 % SOLN Place 1 drop into both eyes 3 (three) times daily.   Yes [provider]  clopidogrel (PLAVIX) 75 MG tablet Take 1 tablet (75 mg total) by mouth daily. Patient taking differently: Take 75 mg by mouth at bedtime. 03/06/20  Yes Croitoru, [name], MD  cycloSPORINE (RESTASIS) 0.05 % ophthalmic emulsion Place 1 drop into both eyes every 12 (twelve) hours.   Yes [provider]  empagliflozin (JARDIANCE) 10 MG TABS tablet Take 10 mg  by mouth daily before breakfast. 06/30/19  Yes Bensimhon, Shaune Pascal, MD  EPINEPHrine 0.3 mg/0.3 mL IJ SOAJ injection Inject 0.3 mg into the muscle once as needed (severe allergic reaction).   Yes [provider]  ferrous sulfate 325 (65 FE) MG tablet Take 325 mg by mouth daily with breakfast.   Yes [provider]  fluticasone (FLONASE) 50 MCG/ACT nasal spray Place 1 spray into both nostrils daily. 03/22/20  Yes [provider]  ipratropium (ATROVENT) 0.02 % nebulizer solution Take 2.5 mLs (0.5 mg total) by nebulization 4 (four) times daily. Patient taking differently: Take 0.5 mg by nebulization every 6 (six) hours as needed for wheezing or shortness of breath. 05/02/18  Yes Tamala Julian,  Rondell A, MD  isosorbide mononitrate (IMDUR) 30 MG 24 hr tablet Take 3 tablets (90 mg total) by mouth daily. 07/12/20  Yes Croitoru, [name], MD  loratadine (CLARITIN) 10 MG tablet Take 10 mg by mouth daily. 11/23/19  Yes [provider]  Multiple Vitamins-Minerals (HAIR/SKIN/NAILS/BIOTIN) TABS Take 1 tablet by mouth every morning.   Yes [provider]  nitroGLYCERIN (NITROSTAT) 0.4 MG SL tablet Place 1 tablet (0.4 mg total) under the tongue every 5 (five) minutes as needed for chest pain. 11/13/16 09/16/20 Yes Kilroy, Luke K, PA-C  nystatin cream (MYCOSTATIN) Apply 1 application topically 2 (two) times daily as needed (rash). 06/14/20  Yes [provider]  OXYGEN Inhale 3 L/min into the lungs continuous.    Yes [provider]  pantoprazole (PROTONIX) 40 MG tablet Take 1 tablet (40 mg total) by mouth 2 (two) times daily. 07/08/19  Yes Nandigam, Venia Minks, MD  polyethylene glycol (MIRALAX / GLYCOLAX) 17 g packet Take 17 g by mouth daily.   Yes [provider]  potassium chloride SA (KLOR-CON) 20 MEQ tablet Take 20 mEq by mouth every morning.   Yes [provider]  predniSONE (DELTASONE) 10 MG tablet Take 10 mg by mouth daily as needed (as directed for  gout flares).  02/15/19  Yes [provider]  ranolazine (RANEXA) 1000 MG SR tablet TAKE 1 TABLET BY MOUTH  TWICE DAILY Patient taking differently: Take 1,000 mg by mouth 2 (two) times daily. 07/12/20  Yes Croitoru, [name], MD  rosuvastatin (CRESTOR) 20 MG tablet Take 1 tablet (20 mg total) by mouth daily. NEED OV. Patient taking differently: Take 20 mg by mouth every evening. NEED OV. 08/17/20  Yes Croitoru, [name], MD  torsemide (DEMADEX) 20 MG tablet Take 1 tablet (20 mg total) by mouth daily. Patient taking differently: Take 20 mg by mouth 2 (two) times daily with breakfast and lunch. 04/25/20  Yes Croitoru, Dani Gobble, MD  ACCU-CHEK AVIVA PLUS test strip  10/19/19   [provider]  Accu-Chek Softclix Lancets lancets  08/10/19   [provider]  Alcohol Swabs (B-D SINGLE USE SWABS REGULAR) PADS  08/10/19   [provider]  bisoprolol (ZEBETA) 5 MG tablet Take 1 tablet (5 mg total) by mouth daily. Patient not taking: No sig reported 03/06/20   Croitoru, [name], MD  diclofenac sodium (VOLTAREN) 1 % GEL Apply 2 g topically 4 (four) times daily. Patient not taking: Reported on 09/16/2020 05/02/18   Norval Morton, MD    Social History   Socioeconomic History   Marital status: Widowed    Spouse name: Not on file   Number of children: 5   Years of education: Not on file   Highest education level: Not on file  Occupational History   Occupation: STAFF/BUFFET    Employer: [employer]  Tobacco Use   Smoking status: Former    Packs/day: 0.25    Years: 3.00    Pack years: 0.75    Types: Cigarettes    Quit date: 02/11/1968    Years since quitting: 52.6   Smokeless tobacco: Never  Vaping Use   Vaping Use: Never used  Substance and Sexual Activity   Alcohol use: No   Drug use: No   Sexual activity: Never  Other Topics Concern   Not on file  Social History Narrative   Widowed last year. She lives with her two sons.   Social Determinants of Health    Financial Resource Strain: Not on file  Food Insecurity: Not on file  Transportation Needs: Not on file  Physical Activity: Not on file  Stress: Not on file  Social Connections: Not on file  Intimate Partner Violence: Not on file     Family History  Problem Relation Age of Onset   Breast cancer Mother    Diabetes Mother    Heart disease Maternal Grandmother    Kidney disease Maternal Grandmother    Diabetes Maternal Grandmother    Glaucoma Maternal Aunt    Heart disease Maternal Aunt    Asthma Sister    Colon cancer Neg Hx    Stomach cancer Neg Hx    Pancreatic cancer Neg Hx     ROS: Otherwise negative unless mentioned in HPI  Physical Examination  Vitals:   09/17/20 1111 09/17/20 1200  BP:  122/68  Pulse:  77  Resp:  16  Temp: 98.1 F (36.7 C) 98.1 F (36.7 C)  SpO2:  99%   Body mass index is 43.91 kg/m.  General:  WDWN in NAD Gait: Not observed HENT: WNL, normocephalic Pulmonary: normal non-labored breathing, without Rales, rhonchi,  wheezing Cardiac: regular Abdomen: soft, NT/ND, no masses Skin: without rashes Vascular Exam/Pulses: Symmetrical radial pulses; symmetrical DP pulses Extremities: without ischemic changes, without Gangrene , without cellulitis; without open wounds;  Musculoskeletal: no muscle wasting or atrophy  Neurologic: A&O X 3;  CN grossly intact Psychiatric:  The pt has Normal affect. Lymph:  Unremarkable  CBC    Component Value Date/Time   WBC 6.2 09/17/2020 0500   RBC 4.51 09/17/2020 0500   HGB 13.2 09/17/2020 0500   HGB 11.6 03/28/2019 1048   HCT 45.1 09/17/2020 0500   HCT 36.1 03/28/2019 1048   PLT 138 (L) 09/17/2020 0500   PLT 195 03/28/2019 1048   MCV 100.0 09/17/2020 0500   MCV 88 03/28/2019 1048   MCH 29.3 09/17/2020 0500   MCHC 29.3 (L) 09/17/2020 0500   RDW 14.1 09/17/2020 0500   RDW 13.9 03/28/2019 1048   LYMPHSABS 1.0 09/17/2020 0500   MONOABS 0.5 09/17/2020 0500   EOSABS 0.1 09/17/2020 0500   BASOSABS  0.0 09/17/2020 0500    BMET    Component Value Date/Time   NA 136 09/17/2020 0500   NA 138 03/28/2019 1048   K 4.3 09/17/2020 0500   CL 99 09/17/2020 0500   CO2 26 09/17/2020 0500   GLUCOSE 127 (H) 09/17/2020 0500   BUN 15 09/17/2020 0500   BUN 52 (H) 03/28/2019 1048   CREATININE 1.12 (H) 09/17/2020 0500   CREATININE 1.05 04/04/2014 1014   CALCIUM 9.1 09/17/2020 0500   GFRNONAA 51 (L) 09/17/2020 0500   GFRAA 25 (L) 06/02/2019 0530    COAGS: Lab Results  Component Value Date   INR 1.0 09/16/2020   INR SPECIMEN CLOTTED 09/16/2020   INR 0.9 04/01/2020     Non-Invasive Vascular Imaging:   Carotid duplex demonstrates 80 to 99% stenosis of right ICA and 40 to 59% stenosis of left ICA    ASSESSMENT/PLAN: This is a 75 y.o. female with R CVA/TIA and critical right ICA stenosis  -Carotid duplex demonstrates 80 to 99% stenosis of right ICA -Right-sided CVA/TIA and left sided facial weakness are consistent with critical right ICA stenosis -Patient states she has a anaphylactic reaction to contrast dye; she is unsure if she is ever been premedicated to receive contrast for example before coronary catheterization however I will defer further imaging decision to Dr. Carlis Abbott -On-call vascular surgeon Dr. Carlis Abbott will  evaluate the patient later today and provide further treatment plans regarding revascularization of right ICA to prevent further neurological events   Dagoberto Ligas PA-C [facility] [phone]  I have seen and evaluated the patient. I agree with the PA note as documented above.  75 year old female with HTN, HLD, CAD s/p CABG, DM, COPD, CHF that vascular surgery has been consulted for symptomatic right ICA carotid stenosis that appears high-grade.  States she presented to the ED yesterday with slurred speech as well as facial droop and left-sided weakness.  She denies any prior history of strokes or TIAs.  Carotid ultrasound did show greater than 80% right  ICA stenosis and vascular surgery was consulted.  She feels she is back at her baseline except for some ongoing slurred speech.  She had a contralateral 40 to 59% left ICA stenosis.  I discussed with her that she would likely benefit from right carotid revascularization for long-term stroke risk reduction.  Unfortunately her carotid ultrasound was very limited in evaluating the bifurcation or the distal extent of the lesion given her obesity and the depth of her vessels.  She does have a contrast allergy but in reviewing records she has had cardiac cath with contrast in 2019 and 2021 and has had this contrast allergy for over 15 to 20 years according to her.  We discussed that we could plan for premedication and I have ordered a CTA neck with the premedication protocol including prednisone and benadryl.  Discussed after review of imaging could make decision about carotid endarterectomy versus TCAR.  Likely will be scheduled on an outpatient basis within the next several weeks as I discussed with her today and she does want to go home if possible.  Will likely remain on aspirin Plavix.  We will continue to follow.  [name], MD [facility] Office: [phone]

## 2020-09-17 NOTE — ED Notes (Signed)
Checked patient cbg it was 7 notified RN of blood sugar

## 2020-09-17 NOTE — ED Notes (Signed)
Placed Breakfast Order 

## 2020-09-17 NOTE — Progress Notes (Signed)
VASCULAR LAB    TCD has been performed.  See CV proc for preliminary results.   Natoria Archibald, RVT 09/17/2020, 9:24 AM

## 2020-09-17 NOTE — Evaluation (Signed)
Speech Language Pathology Evaluation Patient Details Name: [name] MRN: [id number] DOB: 03-Sep-1945 Today's Date: 09/17/2020 Time: 4193-7902 SLP Time Calculation (min) (ACUTE ONLY): 20 min  Problem List:  Patient Active Problem List   Diagnosis Date Noted   Acute left-sided weakness 09/16/2020   Blood blister 08/01/2020   Elevated troponin 06/01/2019   Constipation 10/21/2018   Chronic headaches 07/30/2018   Congestive heart failure (CHF) ([place]) 07/30/2018   Supplemental oxygen dependent 07/30/2018   Leukopenia 05/03/2018   Thrombocytopenia ([place]) 05/03/2018   Pneumonia due to human metapneumovirus 05/02/2018   Chest pain 05/01/2018   Allergic rhinitis 04/13/2018   S/P laparoscopic cholecystectomy 04/09/2018   Chronic respiratory failure with hypoxia and hypercapnia ([place]) 03/07/2018   Polyp of cecum    Polyp of transverse colon    Positive colorectal cancer screening using Cologuard test    Dysphagia    Acute on chronic renal failure ([place]) 11/20/2017   CAD (coronary artery disease) 11/20/2017   DM2 (diabetes mellitus, type 2) ([place]) 11/20/2017   Abdominal pain, epigastric 11/20/2017   Vasomotor rhinitis 10/16/2016   Right facial numbness 03/05/2016   CKD (chronic kidney disease), stage III ([place]) 03/05/2016   Atrial fibrillation ([place]) 10/24/2015   Chest pain with moderate risk of acute coronary syndrome 10/23/2015   History of TIA (transient ischemic attack) 10/22/2015   Complete heart block ([place]) 06/22/2015   Allergic drug rash due to anti-infective agent 04/29/2015   Fatigue 02/14/2015   Chronic diastolic CHF (congestive heart failure) ([place]) 02/14/2015   Cellulitis 12/21/2014   S/P CABG x 5 11/29/2014   CAD S/P percutaneous coronary angioplasty    Diarrhea 07/12/2014   Generalized abdominal pain 40/97/3532   Diastolic CHF, acute on chronic ([place]) 11/04/2013   Mixed hypercholesterolemia and hypertriglyceridemia 04/22/2013   Vertigo 04/22/2013   Dyspnea on exertion  08/25/2012   Allergic to IV contrast 07/23/2012   Unstable angina ([place]) 07/22/2012   Morbid (severe) obesity due to excess calories ([place]) 11/22/2011   Gastroesophageal reflux disease 11/22/2011   Back pain 11/22/2011   OSA (obstructive sleep apnea) 11/22/2011   HEMORRHOIDS-EXTERNAL 02/21/2010   NAUSEA 02/21/2010   ABDOMINAL PAIN -GENERALIZED 02/21/2010   PERSONAL HX COLONIC POLYPS 02/21/2010   ANEMIA 04/16/2007   Essential hypertension 04/16/2007   DIVERTICULOSIS, COLON 04/16/2007   ARTHRITIS 04/16/2007   INTERNAL HEMORRHOIDS WITHOUT MENTION COMP 04/12/2007   Asthma 04/12/2007   Cardiac resynchronization therapy pacemaker (CRT-P) in place 04/12/2007   Gastritis without bleeding 12/14/2006   COLONIC POLYPS, HYPERPLASTIC 09/27/2002   FATTY LIVER DISEASE 02/16/2002   Past Medical History:  Past Medical History:  Diagnosis Date   ABDOMINAL PAIN -GENERALIZED 02/21/2010   Qualifier: Diagnosis of  By: Chester Holstein NP, Nevin Bloodgood     AICD (automatic cardioverter/defibrillator) present    Anemia    Arthritis    Asthma    Atrial fibrillation ([place]) 10/24/2015   Questionable history of A Fib/A Flutter. On device interrogation on 9/7, showed 0.61min of A Fib/A Flutter with 1% burden.  Device check in Jan 2018 showed no sustained AF (runs of less than 30 seconds). No anticoagulation indicated.   CAD (coronary artery disease)    a. 10/09/16 LHC: SVG->LAD patent, SVG->Diag patent, SVG->RCA patent, SVG->LCx occluded. EF 60%, b. 10/31/16 LHC DES to AV groove Circ, DES to intermed branch   Cellulitis and abscess of foot 12/2014   RT FOOT   CHF (congestive heart failure) (HCC)    Chronic bronchitis (HCC)    Chronic lower back pain  Complete heart block (HCC) 06/22/2015   Diverticulosis    Facial numbness 02/2016   Fatty liver disease, nonalcoholic    Gastritis    GERD (gastroesophageal reflux disease)    Gout    H/O hiatal hernia    HEMORRHOIDS-EXTERNAL 02/21/2010   Qualifier: Diagnosis of  By:  Chester Holstein NP, Paula     High cholesterol    Hyperplastic colon polyp    Hypertension    IBS (irritable bowel syndrome)    Internal hemorrhoids    INTERNAL HEMORRHOIDS WITHOUT MENTION COMP 04/12/2007   Qualifier: Diagnosis of  By: Olevia Perches MD, Lowella Bandy    Ischemic cardiomyopathy    Nonischemic cardiomyopathy ([place]) 11/22/2011   Pt responded to BiV ICD- last EF 55-60% Sept 2017   Obesity    On home oxygen therapy    "2L at night" (10/31/2016)   OSA (obstructive sleep apnea)    "can't tolerate a mask" (10/30/2016)   PERSONAL HX COLONIC POLYPS 02/21/2010   Qualifier: Diagnosis of  By: Chester Holstein NP, Paula     Pneumonia    "couple times" (10/31/2016)   Right facial numbness 03/05/2016   Shortness of breath    TIA (transient ischemic attack)    "recently" (10/31/2016)   Type II diabetes mellitus ([place])    Past Surgical History:  Past Surgical History:  Procedure Laterality Date   ABDOMINAL ULTRASOUND  12/01/2011   Peripelvic cysts- #1- 2.4x1.9x2.3cm, #2-1.2x0.9x1.2cm   ANKLE FRACTURE SURGERY Right    "had rod put in"   ANTERIOR CERVICAL DECOMP/DISCECTOMY FUSION     APPENDECTOMY     BACK SURGERY     BIOPSY  12/29/2017   Procedure: BIOPSY;  Surgeon: [name], MD;  Location: [facility];  Service: Endoscopy;;   BIV ICD INSERTION CRT-D  2001?   BIV PACEMAKER GENERATOR CHANGE OUT N/A 11/02/2012   Procedure: BIV PACEMAKER GENERATOR CHANGE OUT;  Surgeon: Sanda Klein, MD;  Location: [facility];  Service: Cardiovascular;  Laterality: N/A;   CARDIAC CATHETERIZATION  05/17/1999   No significant coronary obstructive disease w/ mild 20% luminal irregularity of the first diag branch of the LAD   CARDIAC CATHETERIZATION  07/08/2002   No significant CAD, moderately depressed LV systolic function   CARDIAC CATHETERIZATION Bilateral 04/26/2007   Normal findings, recommend medical therapy   CARDIAC CATHETERIZATION  02/18/2008   Moderate CAD, would benefit from having a functional study, recommend  continue medical therapy   CARDIAC CATHETERIZATION  07/23/2012   Medical therapy   CARDIAC CATHETERIZATION N/A 11/24/2014   Procedure: Left Heart Cath and Coronary Angiography;  Surgeon: Troy Sine, MD;  Location: [facility];  Service: Cardiovascular;  Laterality: N/A;   CARDIAC CATHETERIZATION  11/27/2014   Procedure: Intravascular Pressure Wire/FFR Study;  Surgeon: Peter M Martinique, MD;  Location: [facility];  Service: Cardiovascular;;   CARDIAC CATHETERIZATION  10/09/2016   CHOLECYSTECTOMY N/A 04/09/2018   Procedure: LAPAROSCOPIC CHOLECYSTECTOMY;  Surgeon: Greer Pickerel, MD;  [facility]: [facility];  Service: General;  Laterality: N/A;   COLONOSCOPY WITH PROPOFOL N/A 12/29/2017   Procedure: COLONOSCOPY WITH PROPOFOL;  Surgeon: [name], MD;  [facility];  Service: Endoscopy;  Laterality: N/A;   CORONARY ANGIOGRAM  2010   CORONARY ARTERY BYPASS GRAFT N/A 11/29/2014   Procedure: CORONARY ARTERY BYPASS GRAFTING x 5 (LIMA-LAD, SVG-D, SVG-OM1-OM2, SVG-PD);  Surgeon: Melrose Nakayama, MD;  Location: [facility];  Service: Open Heart Surgery;  Laterality: N/A;   CORONARY STENT INTERVENTION N/A 10/31/2016  Procedure: CORONARY STENT INTERVENTION;  Surgeon: Burnell Blanks, MD;  Location: [facility];  Service: Cardiovascular;  Laterality: N/A;   ESOPHAGOGASTRODUODENOSCOPY (EGD) WITH PROPOFOL N/A 12/29/2017   Procedure: ESOPHAGOGASTRODUODENOSCOPY (EGD) WITH PROPOFOL;  Surgeon: [name], MD;  [facility];  Service: Endoscopy;  Laterality: N/A;   FRACTURE SURGERY     INSERT / REPLACE / [place] ARTHROSCOPY Bilateral    LEFT HEART CATH AND CORS/GRAFTS ANGIOGRAPHY N/A 12/09/2017   Procedure: LEFT HEART CATH AND CORS/GRAFTS ANGIOGRAPHY;  Surgeon: Troy Sine, MD;  Location: [facility];  Service: Cardiovascular;  Laterality: N/A;   LEFT HEART CATHETERIZATION WITH CORONARY ANGIOGRAM N/A 07/23/2012    Procedure: LEFT HEART CATHETERIZATION WITH CORONARY ANGIOGRAM;  Surgeon: Leonie Man, MD;  Location: [facility] CATH LAB;  Service: Cardiovascular;  Laterality: N/A;   LEXISCAN MYOVIEW  11/14/2011   Mild-moderate defect seen in Mid Inferolateral and Mid Anterolateral regions-consistant w/ infarct/scar. No significant ischemia demonstrated.   POLYPECTOMY  12/29/2017   Procedure: POLYPECTOMY;  Surgeon: [name], MD;  [facility];  Service: Endoscopy;;   RIGHT/LEFT HEART CATH AND CORONARY ANGIOGRAPHY N/A 10/09/2016   Procedure: RIGHT/LEFT HEART CATH AND CORONARY ANGIOGRAPHY;  Surgeon: Jolaine Artist, MD;  Location: [facility];  Service: Cardiovascular;  Laterality: N/A;   RIGHT/LEFT HEART CATH AND CORONARY/GRAFT ANGIOGRAPHY N/A 03/11/2019   Procedure: RIGHT/LEFT HEART CATH AND CORONARY/GRAFT ANGIOGRAPHY;  Surgeon: Jolaine Artist, MD;  Location: [facility];  Service: Cardiovascular;  Laterality: N/A;   TEE WITHOUT CARDIOVERSION N/A 11/29/2014   Procedure: TRANSESOPHAGEAL ECHOCARDIOGRAM (TEE);  Surgeon: Melrose Nakayama, MD;  Location: [facility];  Service: Open Heart Surgery;  Laterality: N/A;   TRANSTHORACIC ECHOCARDIOGRAM  07/23/2012   EF 55-60%, normal-mild   TUBAL LIGATION     HPI:  Patient is a 75 y.o. female with PMH: AD with CABG x5 in 11/2014,  multiple TIA on plavix and ASA, diastolic & systolic  CHF, chronic hypoxic respiratory failure on 3L oxygen via [place], CKD stage III, T2DM, HTN, HLD, OSA, morbid obesity, biventricular ICD. She presented to ER with acute onset  left sided weakness and left facial droop and slurred speech on 8/7. CT head was negative for stroke. Patient has h/o allergic reaction to contrast.   Assessment / Plan / Recommendation Clinical Impression  Patient presents with cognition, language and speech abilities that are all [facility]. Patient did present with very mild left sided droop of mouth however this did not impact speech production  and all other oral motor function was normal. Patient reported that on day of admission, she was trying to tell the cable TV technician what service she wanted but she was having difficulties getting the words out. She then noticed some facial droop when putting her make up on. Currently, patient does endorse intermittent difficulty with word-finding, however SLP did not observe this during conversation. SLP is not recommending any formal speech-language therapy at this time.    SLP Assessment  SLP Recommendation/Assessment: Patient does not need any further Speech Lanaguage Pathology Services SLP Visit Diagnosis: Cognitive communication deficit (R41.841)    Follow Up Recommendations  None    Frequency and Duration   N/A        SLP Evaluation Cognition  Overall Cognitive Status: Within Functional Limits for tasks assessed Arousal/Alertness: Awake/alert Orientation Level: Oriented X4 Memory: Appears intact Awareness: Appears intact Problem Solving: Appears intact Safety/Judgment: Appears intact  Comprehension  Auditory Comprehension Overall Auditory Comprehension: Appears within functional limits for tasks assessed Reading Comprehension Reading Status: Not tested    Expression Expression Primary Mode of Expression: Verbal Verbal Expression Overall Verbal Expression: Appears within functional limits for tasks assessed (Patient reports continued intermittent word finding difficulties. SLP did not observe any word finding or other language errors during this evaluation.) Initiation: No impairment Level of Generative/Spontaneous Verbalization: Conversation Repetition: No impairment Naming: No impairment Written Expression Dominant Hand: Right   Oral / Motor  Oral Motor/Sensory Function Overall Oral Motor/Sensory Function: Mild impairment Facial ROM: Within Functional Limits Facial Symmetry: Abnormal symmetry left Facial Strength: Within Functional Limits Facial  Sensation: Within Functional Limits Lingual ROM: Within Functional Limits Lingual Symmetry: Within Functional Limits Lingual Strength: Within Functional Limits Lingual Sensation: Within Functional Limits Velum: Within Functional Limits Mandible: Within Functional Limits Motor Speech Overall Motor Speech: Appears within functional limits for tasks assessed   [place], MA, CCC-SLP Speech Therapy

## 2020-09-17 NOTE — Progress Notes (Signed)
PT Cancellation Note  Patient Details Name: [name] MRN: [id number] DOB: 1945-09-28   Cancelled Treatment:    Reason Eval/Treat Not Completed: Other (comment).  Pt is in a procedure and will retry at another time.   Ramond Dial 09/17/2020, 9:55 AM  Mee Hives, PT MS Acute Rehab Dept. Number: [place]

## 2020-09-17 NOTE — Progress Notes (Signed)
  Echocardiogram 2D Echocardiogram has been performed.  [name] 09/17/2020, 10:32 AM

## 2020-09-18 ENCOUNTER — Observation Stay (HOSPITAL_COMMUNITY): Payer: Medicare Other

## 2020-09-18 DIAGNOSIS — E1122 Type 2 diabetes mellitus with diabetic chronic kidney disease: Secondary | ICD-10-CM | POA: Diagnosis not present

## 2020-09-18 DIAGNOSIS — I671 Cerebral aneurysm, nonruptured: Secondary | ICD-10-CM

## 2020-09-18 DIAGNOSIS — I2510 Atherosclerotic heart disease of native coronary artery without angina pectoris: Secondary | ICD-10-CM | POA: Diagnosis not present

## 2020-09-18 DIAGNOSIS — R531 Weakness: Secondary | ICD-10-CM | POA: Diagnosis not present

## 2020-09-18 DIAGNOSIS — Z950 Presence of cardiac pacemaker: Secondary | ICD-10-CM | POA: Diagnosis not present

## 2020-09-18 DIAGNOSIS — I6523 Occlusion and stenosis of bilateral carotid arteries: Secondary | ICD-10-CM

## 2020-09-18 LAB — GLUCOSE, CAPILLARY
Glucose-Capillary: 176 mg/dL — ABNORMAL HIGH (ref 70–99)
Glucose-Capillary: 262 mg/dL — ABNORMAL HIGH (ref 70–99)
Glucose-Capillary: 188 mg/dL — ABNORMAL HIGH (ref 70–99)
Glucose-Capillary: 168 mg/dL — ABNORMAL HIGH (ref 70–99)

## 2020-09-18 MED ORDER — IOHEXOL 350 MG/ML SOLN
75.0000 mL | Freq: Once | INTRAVENOUS | Status: AC | PRN
  Administered 2020-09-18: 75 mL via INTRAVENOUS

## 2020-09-18 MED ORDER — PHENOL 1.4 % MT LIQD: 1.0000 | OROMUCOSAL | Status: DC | PRN

## 2020-09-18 NOTE — Evaluation (Signed)
Physical Therapy Evaluation Patient Details Name: [name] MRN: [id number] DOB: December 19, 1945 Today's Date: 09/18/2020   History of Present Illness  [name] is a 75 y.o. female with medical history significant of CAD with CABG x5 in 11/2014,  multiple TIA on plavix and ASA, diastolic & systolic  CHF, chronic hypoxic respiratory failure on 3L oxygen via [place], CKD stage III, T2DM, HTN, HLD, OSA, morbid obesity, biventricular ICD who presented to ER for acute onset left sided weakness and left facial droop. Testing showed high-grade right ICA stenosis (80 to 99%); CT of head--negative.   Clinical Impression  Patient received sitting up on side of bed, agreeable to PT assessment.  She reports some sob. States she has been ambulating to bathroom. Patient performed sit to stand with supervision. Ambulated 50 feet in room without AD, but is fatigued and sob with this. Patient will continue to benefit from skilled PT while here to improve activity tolerance, strength for improved safety at home.     Follow Up Recommendations Home health PT    Equipment Recommendations  None recommended by PT    Recommendations for Other Services       Precautions / Restrictions Precautions Precautions: Fall Precaution Comments: monitor O2 I was unable to get accurate reading after ambulation. Restrictions Weight Bearing Restrictions: No      Mobility  Bed Mobility               General bed mobility comments: patient received sitting up on side of bed    Transfers Overall transfer level: Needs assistance Equipment used: None Transfers: Sit to/from Stand Sit to Stand: Supervision         General transfer comment: transfers with supervision  Ambulation/Gait Ambulation/Gait assistance: Supervision Gait Distance (Feet): 50 Feet Assistive device: None   Gait velocity: decr   General Gait Details: patient ambulated 2 laps to door of room with no ad, supervision. SOB with this  amount of activity.  Increased lateral sway with ambulation.  Stairs            Wheelchair Mobility    Modified Rankin (Stroke Patients Only) Modified Rankin (Stroke Patients Only) Pre-Morbid Rankin Score: No symptoms Modified Rankin: No significant disability     Balance Overall balance assessment: Mild deficits observed, not formally tested                                           Pertinent Vitals/Pain Pain Assessment: No/denies pain    Home Living Family/patient expects to be discharged to:: Private residence Living Arrangements: Children Available Help at Discharge: Available PRN/intermittently;Family Type of Home: Mobile home Home Access: Stairs to enter Entrance Stairs-Rails: Left Entrance Stairs-Number of Steps: 3 Home Layout: One level Home Equipment: Walker - 4 wheels;Tub bench;Hand held shower head Additional Comments: 3 L of O2 at home    Prior Function Level of Independence: Independent         Comments: Does some IADLs, son works nights and is often sleeping during the day, but helpful     Hand Dominance   Dominant Hand: Right    Extremity/Trunk Assessment   Upper Extremity Assessment Upper Extremity Assessment: LUE deficits/detail LUE Coordination: decreased gross motor    Lower Extremity Assessment Lower Extremity Assessment: Overall WFL for tasks assessed    Cervical / Trunk Assessment Cervical / Trunk Assessment: Normal  Communication  Communication: No difficulties  Cognition Arousal/Alertness: Awake/alert Behavior During Therapy: WFL for tasks assessed/performed Overall Cognitive Status: Within Functional Limits for tasks assessed                                        General Comments      Exercises     Assessment/Plan    PT Assessment Patient needs continued PT services  PT Problem List Decreased strength;Decreased mobility;Decreased activity tolerance;Decreased  balance;Cardiopulmonary status limiting activity;Obesity       PT Treatment Interventions Therapeutic exercise;Gait training;Functional mobility training;Therapeutic activities;Stair training;Patient/family education    PT Goals (Current goals can be found in the Care Plan section)  Acute Rehab PT Goals Patient Stated Goal: to return home, get stronger PT Goal Formulation: With patient Time For Goal Achievement: 09/25/20 Potential to Achieve Goals: Good    Frequency Min 3X/week   Barriers to discharge Decreased caregiver support      Co-evaluation               AM-PAC PT "6 Clicks" Mobility  Outcome Measure Help needed turning from your back to your side while in a flat bed without using bedrails?: None Help needed moving from lying on your back to sitting on the side of a flat bed without using bedrails?: None Help needed moving to and from a bed to a chair (including a wheelchair)?: A Little Help needed standing up from a chair using your arms (e.g., wheelchair or bedside chair)?: A Little Help needed to walk in hospital room?: A Little Help needed climbing 3-5 steps with a railing? : A Lot 6 Click Score: 19    End of Session Equipment Utilized During Treatment: Oxygen Activity Tolerance: Patient limited by fatigue Patient left: in chair;with call bell/phone within reach Nurse Communication: Mobility status PT Visit Diagnosis: Muscle weakness (generalized) (M62.81);Difficulty in walking, not elsewhere classified (R26.2)    Time: 6294-7654 PT Time Calculation (min) (ACUTE ONLY): 23 min   Charges:   PT Evaluation $PT Eval Moderate Complexity: 1 Mod PT Treatments $Gait Training: 8-22 mins        Ashyra Cantin, PT, GCS 09/18/20,10:24 AM

## 2020-09-18 NOTE — Progress Notes (Signed)
Progress Note    JESSICCA STITZER  ZTI:[id number] DOB: [birth year]  DOA: 09/16/2020 PCP: Pcp, No      Brief Narrative:    Medical records reviewed and are as summarized below:  SHALEAH NISSLEY is a 75 y.o. female  with medical history significant of CAD with CABG x5 in 11/2014,  multiple TIA on plavix and ASA, diastolic & systolic  CHF, chronic hypoxic respiratory failure on 3L oxygen via [place], CKD stage III, T2DM, HTN, HLD, OSA, morbid obesity, biventricular ICD who presented to ER for acute onset left sided weakness and left facial droop, who presented to the hospital because of left-sided weakness, left facial droop and slurred speech that started around 9 AM on 09/16/2020.  CT head did not show any evidence of acute stroke.  However carotid duplex showed high-grade right ICA stenosis (80 to 99%).    Assessment/Plan:   Principal Problem:   Acute left-sided weakness Active Problems:   Essential hypertension   Asthma   Cardiac resynchronization therapy pacemaker (CRT-P) in place   Gastroesophageal reflux disease   OSA (obstructive sleep apnea)   Chronic diastolic CHF (congestive heart failure) (HCC)   History of TIA (transient ischemic attack)   CKD (chronic kidney disease), stage III (HCC)   CAD (coronary artery disease)   DM2 (diabetes mellitus, type 2) (HCC)   Chronic respiratory failure with hypoxia and hypercapnia (HCC)   Chest pain    Body mass index is 41.75 kg/m.    Suspected acute stroke with left-sided weakness and slurred speech, high-grade right ICA stenosis (80 to 99%): Continue aspirin, Plavix and rosuvastatin.  Repeat CT head did not show any evidence of acute stroke.  Case was discussed with Dr. Carlis Abbott, vascular surgeon, this morning.  He said he will review imaging with rep for transcarotid artery stenting and make a decision on the best option for this patient.  Follow-up with vascular surgeon for further recommendations.  Intracranial aneurysm (5 mm)  of the distal left ICA: Consulted neurosurgeon through the neurosurgery office.  Hypertensive urgency, hypertension: Allow for permissive hypertension.  Chest pain with mildly elevated troponins, CAD with history of CABG: Chest pain has improved.  Elevated troponins likely from demand ischemia.  Continue antiplatelets, rosuvastatin and ranolazine.  Chronic systolic and diastolic CHF, ICD in place: Compensated  COPD with chronic hypoxic and hypercapnic respiratory failure: Continue bronchodilators and 3 L per minute oxygen via nasal cannula.  Other comorbidities include type II DM, CKD stage IIIa, history of TIAs, OSA, morbid obesity, GERD, asthma   Diet Order             Diet heart healthy/carb modified Room service appropriate? Yes; Fluid consistency: Thin  Diet effective now                      Consultants: Neurologist Vascular surgeon Neurosurgeon  Procedures: None    Medications:    allopurinol  100 mg Oral BID   aspirin EC  81 mg Oral Daily   clopidogrel  75 mg Oral QHS   cycloSPORINE  1 drop Both Eyes Q12H   empagliflozin  10 mg Oral QAC breakfast   enoxaparin (LOVENOX) injection  40 mg Subcutaneous q1600   ferrous sulfate  325 mg Oral Q breakfast   fluticasone  1 spray Each Nare Daily   guaiFENesin  600 mg Oral BID   insulin aspart  0-9 Units Subcutaneous TID WC   isosorbide mononitrate  90 mg Oral  Daily   loratadine  10 mg Oral Daily   mometasone-formoterol  2 puff Inhalation BID   multivitamin with minerals  1 tablet Oral q morning   pantoprazole  40 mg Oral BID   polyvinyl alcohol  1 drop Both Eyes TID   potassium chloride SA  20 mEq Oral q morning   ranolazine  1,000 mg Oral BID   rosuvastatin  40 mg Oral QPM   torsemide  20 mg Oral BID WC   Continuous Infusions:   Anti-infectives (From admission, onward)    None              Family Communication/Anticipated D/C date and plan/Code Status   DVT prophylaxis: enoxaparin  (LOVENOX) injection 40 mg Start: 09/17/20 1600     Code Status: Full Code  Family Communication: None Disposition Plan:    Status is: Observation  The patient will require care spanning > 2 midnights and should be moved to inpatient because: Inpatient level of care appropriate due to severity of illness  Dispo: The patient is from: Home              Anticipated d/c is to: Home              Patient currently is not medically stable to d/c.   Difficult to place patient No           Subjective:   Interval events noted.  She feels better today.  Left-sided weakness has improved.  Her speech is back to baseline.  No headache or dizziness.  Objective:    Vitals:   09/17/20 2100 09/18/20 0600 09/18/20 0630 09/18/20 0830  BP: (!) 150/76  (!) 166/101   Pulse: 82  82   Resp:      Temp: 97.6 F (36.4 C)  (!) 97.4 F (36.3 C)   TempSrc: Oral  Oral   SpO2: 97%  95% 95%  Weight:  106.9 kg    Height:       No data found.   Intake/Output Summary (Last 24 hours) at 09/18/2020 1120 Last data filed at 09/17/2020 2300 Gross per 24 hour  Intake --  Output 700 ml  Net -700 ml   Filed Weights   09/16/20 1800 09/18/20 0600  Weight: 108.9 kg 106.9 kg    Exam:  GEN: NAD SKIN: Warm and dry EYES: EOMI ENT: MMM CV: RRR PULM: CTA B ABD: soft, obese, NT, +BS CNS: AAO x 3, non focal EXT: No edema or tenderness          Data Reviewed:   I have personally reviewed following labs and imaging studies:  Labs: Labs show the following:   Basic Metabolic Panel: Recent Labs  Lab 09/16/20 1441 09/16/20 1447 09/17/20 0500  NA 138 139 136  K 4.5 4.6 4.3  CL 97* 95* 99  CO2 35*  --  26  GLUCOSE 128* 125* 127*  BUN 18 23 15   CREATININE 1.29* 1.20* 1.12*  CALCIUM 9.1  --  9.1   GFR Estimated Creatinine Clearance: 50.1 mL/min (A) (by C-G formula based on SCr of 1.12 mg/dL (H)). Liver Function Tests: Recent Labs  Lab 09/16/20 1441  AST 19  ALT 12  ALKPHOS 99   BILITOT 0.8  PROT 6.4*  ALBUMIN 3.1*   No results for input(s): LIPASE, AMYLASE in the last 168 hours. No results for input(s): AMMONIA in the last 168 hours. Coagulation profile Recent Labs  Lab 09/16/20 1441 09/16/20 1855  INR SPECIMEN CLOTTED  1.0    CBC: Recent Labs  Lab 09/16/20 1441 09/16/20 1447 09/16/20 2059 09/17/20 0500  WBC 7.1  --  5.9 6.2  NEUTROABS 5.2  --  4.2 4.6  HGB 13.1 15.0 12.0 13.2  HCT 43.3 44.0 40.3 45.1  MCV 98.6  --  100.8* 100.0  PLT 106*  --  104* 138*   Cardiac Enzymes: No results for input(s): CKTOTAL, CKMB, CKMBINDEX, TROPONINI in the last 168 hours. BNP (last 3 results) No results for input(s): PROBNP in the last 8760 hours. CBG: Recent Labs  Lab 09/17/20 1022 09/17/20 1221 09/17/20 1753 09/17/20 2137 09/18/20 0802  GLUCAP 118* 121* 109* 199* 176*   D-Dimer: No results for input(s): DDIMER in the last 72 hours. Hgb A1c: Recent Labs    09/17/20 0500  HGBA1C 6.5*   Lipid Profile: Recent Labs    09/17/20 0500  CHOL 175  HDL 50  LDLCALC 90  TRIG 173*  CHOLHDL 3.5   Thyroid function studies: No results for input(s): TSH, T4TOTAL, T3FREE, THYROIDAB in the last 72 hours.  Invalid input(s): FREET3 Anemia work up: No results for input(s): VITAMINB12, FOLATE, FERRITIN, TIBC, IRON, RETICCTPCT in the last 72 hours. Sepsis Labs: Recent Labs  Lab 09/16/20 1441 09/16/20 2059 09/17/20 0500  WBC 7.1 5.9 6.2    Microbiology Recent Results (from the past 240 hour(s))  Resp Panel by RT-PCR (Flu A&B, Covid) Nasopharyngeal Swab     Status: None   Collection Time: 09/16/20  2:31 PM   Specimen: Nasopharyngeal Swab; Nasopharyngeal(NP) swabs in vial transport medium  Result Value Ref Range Status   SARS Coronavirus 2 by RT PCR NEGATIVE NEGATIVE Final    Comment: (NOTE) SARS-CoV-2 target nucleic acids are NOT DETECTED.  The SARS-CoV-2 RNA is generally detectable in upper respiratory specimens during the acute phase of  infection. The lowest concentration of SARS-CoV-2 viral copies this assay can detect is 138 copies/mL. A negative result does not preclude SARS-Cov-2 infection and should not be used as the sole basis for treatment or other patient management decisions. A negative result may occur with  improper specimen collection/handling, submission of specimen other than nasopharyngeal swab, presence of viral mutation(s) within the areas targeted by this assay, and inadequate number of viral copies(<138 copies/mL). A negative result must be combined with clinical observations, patient history, and epidemiological information. The expected result is Negative.  Fact Sheet for Patients:  EntrepreneurPulse.com.au  Fact Sheet for Healthcare Providers:  IncredibleEmployment.be  This test is no t yet approved or cleared by the [place] FDA and  has been authorized for detection and/or diagnosis of SARS-CoV-2 by FDA under an Emergency Use Authorization (EUA). This EUA will remain  in effect (meaning this test can be used) for the duration of the COVID-19 declaration under Section 564(b)(1) of the Act, 21 U.S.C.section 360bbb-3(b)(1), unless the authorization is terminated  or revoked sooner.       Influenza A by PCR NEGATIVE NEGATIVE Final   Influenza B by PCR NEGATIVE NEGATIVE Final    Comment: (NOTE) The Xpert Xpress SARS-CoV-2/FLU/RSV plus assay is intended as an aid in the diagnosis of influenza from Nasopharyngeal swab specimens and should not be used as a sole basis for treatment. Nasal washings and aspirates are unacceptable for Xpert Xpress SARS-CoV-2/FLU/RSV testing.  Fact Sheet for Patients: EntrepreneurPulse.com.au  Fact Sheet for Healthcare Providers: IncredibleEmployment.be  This test is not yet approved or cleared by the [place] FDA and has been authorized for detection and/or diagnosis of SARS-CoV-2  by FDA under an Emergency Use Authorization (EUA). This EUA will remain in effect (meaning this test can be used) for the duration of the COVID-19 declaration under Section 564(b)(1) of the Act, 21 U.S.C. section 360bbb-3(b)(1), unless the authorization is terminated or revoked.  Performed at [facility], [place] [address], [place], [place] [address]     Procedures and diagnostic studies:  CT HEAD WO CONTRAST (5MM)  Result Date: 09/17/2020 CLINICAL DATA:  Stroke, follow-up. EXAM: CT HEAD WITHOUT CONTRAST TECHNIQUE: Contiguous axial images were obtained from the base of the skull through the vertex without intravenous contrast. COMPARISON:  CT head without contrast 09/16/2020 FINDINGS: Brain: No acute infarct, hemorrhage, or mass lesion is present. Moderate white matter changes are stable. The ventricles are of normal size. No significant extraaxial fluid collection is present. The brainstem and cerebellum are within normal limits. Vascular: Atherosclerotic changes are again noted within the cavernous internal carotid arteries bilaterally. No hyperdense vessel is present. Skull: Calvarium is intact. No focal lytic or blastic lesions are present. No significant extracranial soft tissue lesion is present. Sinuses/Orbits: The paranasal sinuses and mastoid air cells are clear. Bilateral lens replacements are noted. Globes and orbits are otherwise unremarkable. IMPRESSION: 1. Stable moderate white matter disease. This likely reflects the sequela of chronic microvascular ischemia. 2. No acute intracranial abnormality or significant interval change. Electronically Signed   By: San Morelle M.D.   On: 09/17/2020 15:58   CT ANGIO NECK W OR WO CONTRAST  Result Date: 09/18/2020 CLINICAL DATA:  75 year old female status post code stroke presentation on 09/16/2020. No acute intracranial abnormality by CT. EXAM: CT ANGIOGRAPHY NECK TECHNIQUE: Multidetector CT imaging of the neck was performed using  the standard protocol during bolus administration of intravenous contrast. Multiplanar CT image reconstructions and MIPs were obtained to evaluate the vascular anatomy. Carotid stenosis measurements (when applicable) are obtained utilizing NASCET criteria, using the distal internal carotid diameter as the denominator. CONTRAST:  21mL OMNIPAQUE IOHEXOL 350 MG/ML SOLN COMPARISON:  Head CT 09/17/2020 and earlier. CT cervical spine 04/01/2020 FINDINGS: Skeleton: Prior C4-C5 ACDF with evidence of solid arthrodesis there. Advanced chronic cervical spine degeneration elsewhere with possible developing ankylosis at both C5-C6 and C6-C7. Prior sternotomy. Advanced upper thoracic disc and endplate degeneration. No acute osseous abnormality identified. Upper chest: Right chest cardiac pacemaker. Sequelae of CABG. No superior mediastinal lymphadenopathy. Mild upper lung atelectasis. Other neck: Negative. Negative visible brain parenchyma, orbits. Aortic arch: Calcified aortic atherosclerosis. Prior CABG. Three vessel arch configuration. Right carotid system: Calcified plaque at the brachiocephalic artery and right CCA origins without stenosis. Retropharyngeal course of the right CCA with intermittent calcified plaque proximal to the right carotid bifurcation. The bifurcation is also retropharyngeal, and there is bulky soft and calcified plaque at the right carotid bifurcation resulting in stenosis of both the right ECA and right ICA origins (series 9, image 103). Right ICA origin stenosis is 50 % with respect to the distal vessel. Tortuous right ICA distal to the bulb remains patent to the skull base. Left carotid system: Calcified plaque at the left CCA origin without stenosis. Partially retropharyngeal course of the left carotid including the left carotid bifurcation. Calcified plaque at the bifurcation results in less than 50 % stenosis with respect to the distal vessel of the left ICA which remains patent to the skull base.  Mild calcified plaque below the skull base. Vertebral arteries: Soft and calcified plaque of the right subclavian artery origin results an 60% stenosis with respect to  the distal vessel. Tortuous proximal right subclavian. Soft and calcified plaque at the right vertebral artery origin results in moderate origin stenosis. The right vertebral artery is otherwise patent to the skull base. Soft and calcified plaque of the proximal left subclavian artery results in less than 50% stenosis. Left vertebral artery origin is normal. But the left vertebral is non dominant and diminutive throughout the neck and to the skull base. Limited intracranial: Calcified plaque of the dominant right vertebral artery as it crosses the dura results in moderate stenosis on series 8, image 56. Additional right V4 calcified plaque with only mild stenosis. The right vertebral supplies the basilar which is diminutive but remains patent to the SCA origins. There are fetal type bilateral PCA origins. The non dominant left vertebral terminates in PICA. Both ICA siphons are patent but heavily calcified. Bilateral carotid termini are patent. There is a 5 mm aneurysm of the distal left ICA at the posterior communicating artery origin, directed posteriorly and inferiorly (series 8, image 9). The left PCA is fetal. Review of the MIP images confirms the above findings IMPRESSION: 1. Intracranial aneurysm (5 mm) of the distal Left ICA inseparable from the left Pcomm which constitutes a fetal origin of the left PCA. Neuro-Endovascular consultation is suggested to evaluate the appropriateness of potential treatment. 2. Bulky soft and calcified plaque at the retropharyngeal right carotid bifurcation resulting in stenosis of both the Right ICA (50%) and ECA origins. Left carotid atherosclerosisWith less than 50% stenosis of the cervical left ICA. Both ICA siphons are patent but heavily calcified. 3. Right Subclavian Artery origin 60% stenosis. And  superimposed Moderate stenoses of the dominant Right Vertebral Artery (which supplies the Basilar Artery) at both its origin and V4 segments. 4. Prior CABG. Aortic Atherosclerosis (ICD10-I70.0). 5. Prior C4-C5 ACDF with solid arthrodesis and advanced cervical spine degeneration elsewhere with suspected developing ankylosis at C5-C6 and C6-C7. Electronically Signed   By: Genevie Ann M.D.   On: 09/18/2020 06:35   DG Chest Portable 1 View  Result Date: 09/16/2020 CLINICAL DATA:  chest pain EXAM: PORTABLE CHEST 1 VIEW COMPARISON:  April 01, 2020. FINDINGS: Enlarged cardiac silhouette. Central pulmonary vascular congestion. Tortuous aorta. Median sternotomy and CABG. Calcific atherosclerosis aorta. Bibasilar and right midlung opacities. Possible trace fluid layering along the right minor fissure. Otherwise, no visible pleural effusions or pneumothorax on this single semi erect radiograph. IMPRESSION: 1. Cardiomegaly and central pulmonary vascular congestion. Possible trace fluid layering along the right minor fissure. 2. Low lung volumes with right midlung and bibasilar opacities, most likely atelectasis. Infection is not excluded. Electronically Signed   By: Margaretha Sheffield MD   On: 09/16/2020 15:47   ECHOCARDIOGRAM COMPLETE  Result Date: 09/17/2020    ECHOCARDIOGRAM REPORT   Patient Name:   HANIN DECOOK Date of Exam: 09/17/2020 Medical Rec #:  [id number]         Height:       62.0 in Accession #:    [id number]        Weight:       240.1 lb Date of Birth:  1945-03-11          BSA:          2.066 m Patient Age:    55 years          BP:           188/71 mmHg Patient Gender: F  HR:           72 bpm. Exam Location:  Inpatient Procedure: 2D Echo Indications:    stroke  History:        Patient has prior history of Echocardiogram examinations, most                 recent 06/01/2019. CHF, Defibrillator and Pacemaker, chronic                 kidney disease; Risk Factors:Sleep Apnea, Hypertension,                  Dyslipidemia and Diabetes.  Sonographer:    Johny Chess Referring Phys: [id number] ALLISON WOLFE  Sonographer Comments: Patient is morbidly obese. Image acquisition challenging due to patient body habitus and heavy breasts. IMPRESSIONS  1. Left ventricular ejection fraction, by estimation, is 55 to 60%. The left ventricle has normal function. The left ventricle has no regional wall motion abnormalities. Left ventricular diastolic parameters are consistent with Grade I diastolic dysfunction (impaired relaxation). Elevated left atrial pressure.  2. Right ventricular systolic function is normal. The right ventricular size is normal.  3. Left atrial size was moderately dilated.  4. The mitral valve is normal in structure. Trivial mitral valve regurgitation. No evidence of mitral stenosis. Moderate mitral annular calcification.  5. The aortic valve has an indeterminant number of cusps. Aortic valve regurgitation is trivial. Mild aortic valve stenosis.  6. The inferior vena cava is normal in size with greater than 50% respiratory variability, suggesting right atrial pressure of 3 mmHg. FINDINGS  Left Ventricle: Left ventricular ejection fraction, by estimation, is 55 to 60%. The left ventricle has normal function. The left ventricle has no regional wall motion abnormalities. The left ventricular internal cavity size was normal in size. There is  no left ventricular hypertrophy. Left ventricular diastolic parameters are consistent with Grade I diastolic dysfunction (impaired relaxation). Elevated left atrial pressure. Right Ventricle: The right ventricular size is normal. Right ventricular systolic function is normal. Left Atrium: Left atrial size was moderately dilated. Right Atrium: Right atrial size was normal in size. Pericardium: There is no evidence of pericardial effusion. Mitral Valve: The mitral valve is normal in structure. Moderate mitral annular calcification. Trivial mitral valve regurgitation. No  evidence of mitral valve stenosis. Tricuspid Valve: The tricuspid valve is normal in structure. Tricuspid valve regurgitation is trivial. No evidence of tricuspid stenosis. Aortic Valve: The aortic valve has an indeterminant number of cusps. Aortic valve regurgitation is trivial. Mild aortic stenosis is present. Aortic valve mean gradient measures 9.5 mmHg. Aortic valve peak gradient measures 17.2 mmHg. Aortic valve area, by VTI measures 1.43 cm. Pulmonic Valve: The pulmonic valve was normal in structure. Pulmonic valve regurgitation is not visualized. No evidence of pulmonic stenosis. Aorta: The aortic root is normal in size and structure. Venous: The inferior vena cava is normal in size with greater than 50% respiratory variability, suggesting right atrial pressure of 3 mmHg. IAS/Shunts: The interatrial septum was not well visualized. Additional Comments: A device lead is visualized.  LEFT VENTRICLE PLAX 2D LVIDd:         5.40 cm  Diastology LVIDs:         3.70 cm  LV e' medial:    3.92 cm/s LV PW:         1.20 cm  LV E/e' medial:  21.6 LV IVS:        1.20 cm  LV e' lateral:   8.27 cm/s LVOT  diam:     2.10 cm  LV E/e' lateral: 10.2 LV SV:         63 LV SV Index:   30 LVOT Area:     3.46 cm  RIGHT VENTRICLE            IVC RV S prime:     8.49 cm/s  IVC diam: 2.00 cm LEFT ATRIUM             Index       RIGHT ATRIUM           Index LA diam:        4.20 cm 2.03 cm/m  RA Area:     14.10 cm LA Vol (A2C):   78.3 ml 37.90 ml/m RA Volume:   33.50 ml  16.21 ml/m LA Vol (A4C):   85.1 ml 41.19 ml/m LA Biplane Vol: 85.0 ml 41.14 ml/m  AORTIC VALVE AV Area (Vmax):    1.43 cm AV Area (Vmean):   1.42 cm AV Area (VTI):     1.43 cm AV Vmax:           207.50 cm/s AV Vmean:          142.000 cm/s AV VTI:            0.440 m AV Peak Grad:      17.2 mmHg AV Mean Grad:      9.5 mmHg LVOT Vmax:         85.60 cm/s LVOT Vmean:        58.200 cm/s LVOT VTI:          0.182 m LVOT/AV VTI ratio: 0.41  AORTA Ao Root diam: 3.70 cm Ao Asc  diam:  3.50 cm MITRAL VALVE MV Area (PHT): 3.27 cm     SHUNTS MV Decel Time: 232 msec     Systemic VTI:  0.18 m MV E velocity: 84.70 cm/s   Systemic Diam: 2.10 cm MV A velocity: 120.00 cm/s MV E/A ratio:  0.71 Kirk Ruths MD Electronically signed by Kirk Ruths MD Signature Date/Time: 09/17/2020/12:41:21 PM    Final    CT HEAD CODE STROKE WO CONTRAST  Result Date: 09/16/2020 CLINICAL DATA:  Code stroke. Neuro deficit, acute, stroke suspected. Facial droop, slurred speech, and generalized weakness. EXAM: CT HEAD WITHOUT CONTRAST TECHNIQUE: Contiguous axial images were obtained from the base of the skull through the vertex without intravenous contrast. COMPARISON:  04/01/2020 FINDINGS: Brain: There is no evidence of an acute infarct, intracranial hemorrhage, mass, midline shift, or extra-axial fluid collection. Mild cerebral atrophy is within normal limits for age. Hypodensities in the cerebral white matter bilaterally are unchanged and nonspecific but compatible with mild chronic small vessel ischemic disease. There are several chronic punctate calcifications projecting over the left cerebral convexity which may be within sulci and vascular. Vascular: Calcified atherosclerosis at the skull base. No hyperdense vessel. Skull: No fracture or suspicious osseous lesion. Sinuses/Orbits: Paranasal sinuses and mastoid air cells are clear. Bilateral cataract extraction. Other: None. ASPECTS [facility] Stroke Program Early CT Score) - Ganglionic level infarction (caudate, lentiform nuclei, internal capsule, insula, M1-M3 cortex): 7 - Supraganglionic infarction (M4-M6 cortex): 3 Total score (0-10 with 10 being normal): 10 IMPRESSION: 1. No evidence of acute intracranial abnormality. 2. ASPECTS is 10. 3. Mild chronic small vessel ischemic disease. Code stroke imaging results were communicated on 09/16/2020 at 2:57 pm to Dr. Lorrin Goodell via telephone. Electronically Signed   By: Logan Bores M.D.   On: 09/16/2020 14:57  VAS  US CAROTID (at [facility] and [facility] only)  Result Date: 09/17/2020 Carotid Arterial Duplex Study Patient Name:  OLIVIAROSE PUNCH  Date of Exam:   09/17/2020 Medical Rec #: [id number]          Accession #:    [id number] Date of Birth: September 02, 1945           Patient Gender: F Patient Age:   67 years Exam Location:  [facility] Procedure:      VAS US CAROTID Referring Phys: Orma Flaming --------------------------------------------------------------------------------  Indications:       Weakness. Risk Factors:      Hypertension, hyperlipidemia, Diabetes, prior CVA. Other Factors:     OSA, morbid obesity, atria fibrillation, cardiomyopathy,                    AICD. Limitations        Today's exam was limited due to the body habitus of the                    patient and depth of vessels. Comparison Study:  Prior study done 10/24/15 Performing Technologist: Sharion Dove RVS  Examination Guidelines: A complete evaluation includes B-mode imaging, spectral Doppler, color Doppler, and power Doppler as needed of all accessible portions of each vessel. Bilateral testing is considered an integral part of a complete examination. Limited examinations for reoccurring indications may be performed as noted.  Right Carotid Findings: +----------+--------+--------+--------+------------------+------------------+           PSV cm/sEDV cm/sStenosisPlaque DescriptionComments           +----------+--------+--------+--------+------------------+------------------+ CCA Prox  97      23                                intimal thickening +----------+--------+--------+--------+------------------+------------------+ CCA Distal64      24                                intimal thickening +----------+--------+--------+--------+------------------+------------------+ ICA Prox  532     177     80-99%  heterogenous      tortuous           +----------+--------+--------+--------+------------------+------------------+ ICA Distal73       22                                tortuous           +----------+--------+--------+--------+------------------+------------------+ ECA       372     46      >50%                                         +----------+--------+--------+--------+------------------+------------------+ +----------+--------+-------+--------+-------------------+           PSV cm/sEDV cmsDescribeArm Pressure (mmHG) +----------+--------+-------+--------+-------------------+ [id number]                                         +----------+--------+-------+--------+-------------------+ +---------+--------+--------+--------------+ VertebralPSV cm/sEDV cm/sNot identified +---------+--------+--------+--------------+  Left Carotid Findings: +----------+--------+--------+--------+------------------+------------------+           PSV cm/sEDV cm/sStenosisPlaque DescriptionComments           +----------+--------+--------+--------+------------------+------------------+  CCA Prox  63      16                                intimal thickening +----------+--------+--------+--------+------------------+------------------+ CCA Distal100     26                                intimal thickening +----------+--------+--------+--------+------------------+------------------+ ICA Prox  204     56      40-59%  heterogenous      tortuous           +----------+--------+--------+--------+------------------+------------------+ ICA Distal157     64                                tortuous           +----------+--------+--------+--------+------------------+------------------+ ECA       147     17                                                   +----------+--------+--------+--------+------------------+------------------+ +----------+--------+--------+--------+-------------------+           PSV cm/sEDV cm/sDescribeArm Pressure (mmHG) +----------+--------+--------+--------+-------------------+  [id number]                                          +----------+--------+--------+--------+-------------------+ +---------+--------+--------+--------------+ VertebralPSV cm/sEDV cm/sNot identified +---------+--------+--------+--------------+   Summary: Right Carotid: Velocities in the right ICA are consistent with a 80-99%                stenosis. Marked Increase in severity of stenosis since study                done 10/24/15. Left Carotid: Velocities in the left ICA are consistent with a 40-59% stenosis.               Stable since study done 10/24/15. Vertebrals:  Not assessed. Subclavians: Normal flow hemodynamics were seen in bilateral subclavian              arteries. *See table(s) above for measurements and observations.     Preliminary    VAS [place] TRANSCRANIAL DOPPLER  Result Date: 09/17/2020  Transcranial Doppler Patient Name:  LYRICA MCCLARTY  Date of Exam:   09/17/2020 Medical Rec #: [id number]          Accession #:    [id number] Date of Birth: [birth year]           Patient Gender: F Patient Age:   64 years Exam Location:  [facility] Procedure:      VAS [place] TRANSCRANIAL DOPPLER Referring Phys: Orma Flaming --------------------------------------------------------------------------------  Indications: Stroke. History: History of TIA, CVA, carotid stenosis. Limitations for diagnostic windows: Unable to insonate right transtemporal window. Unable to insonate left transtemporal window. Comparison Study: Prior TCD done 10/24/15 Performing Technologist: Sharion Dove RVS Supporting Technologist: Darlin Coco RDMS,RVT  Examination Guidelines: A complete evaluation includes B-mode imaging, spectral Doppler, color Doppler, and power Doppler as needed of all accessible portions of each vessel. Bilateral testing is considered an integral part of a complete examination. Limited examinations for reoccurring indications  may be performed as noted.  +----------+-------------+----------+-----------+-------+  RIGHT TCD Right VM (cm)Depth (cm)PulsatilityComment +----------+-------------+----------+-----------+-------+ ACA          -21.00                 1.07            +----------+-------------+----------+-----------+-------+ Opthalmic     20.00                 0.86            +----------+-------------+----------+-----------+-------+ ICA siphon    35.00                 1.26            +----------+-------------+----------+-----------+-------+ Vertebral    -15.00                 1.24            +----------+-------------+----------+-----------+-------+  +----------+------------+----------+-----------+-------+ LEFT TCD  Left VM (cm)Depth (cm)PulsatilityComment +----------+------------+----------+-----------+-------+ Opthalmic    14.00                 1.28            +----------+------------+----------+-----------+-------+ ICA siphon   19.00                 1.22            +----------+------------+----------+-----------+-------+ Vertebral    -17.00                0.85            +----------+------------+----------+-----------+-------+  +------------+------+-------+             VM cm Comment +------------+------+-------+ Prox Basilar-29.00        +------------+------+-------+    Preliminary                LOS: 0 days   Cherity Blickenstaff  [facility]   Pager on [email or web address]. If 7PM-7AM, please contact night-coverage at [email or web address]     09/18/2020, 11:20 AM

## 2020-09-18 NOTE — Progress Notes (Signed)
[facility] [place]  Subjective  -no additional neurologic events overnight   Objective (!) 166/101 82 (!) 97.4 F (36.3 C) (Oral) 16 95%  Intake/Output Summary (Last 24 hours) at 09/18/2020 0914 Last data filed at 09/17/2020 2300 Gross per 24 hour  Intake --  Output 700 ml  Net -700 ml   Appears grossly neurologically intact  Laboratory Lab Results: Recent Labs    09/16/20 2059 09/17/20 0500  WBC 5.9 6.2  HGB 12.0 13.2  HCT 40.3 45.1  PLT 104* 138*   BMET Recent Labs    09/16/20 1441 09/16/20 1447 09/17/20 0500  NA 138 139 136  K 4.5 4.6 4.3  CL 97* 95* 99  CO2 35*  --  26  GLUCOSE 128* 125* 127*  BUN 18 23 15   CREATININE 1.29* 1.20* 1.12*  CALCIUM 9.1  --  9.1    COAG Lab Results  Component Value Date   INR 1.0 09/16/2020   INR SPECIMEN CLOTTED 09/16/2020   INR 0.9 04/01/2020   No results found for: PTT  Assessment/Planning:  75 year old female that vascular surgery was consulted for a high-grade right carotid stenosis in the ICA with suspected symptomatic lesion given slurred speech facial droop and left-sided weakness on presentation.  I ordered premedication for contrast allergy and she get a CTA neck early this morning.  I reviewed the images and I think she has about a 60 to 70% right ICA stenosis with some associated mural thrombus.  She has very tortuous common carotid and ICA which I discussed with her this morning.  I think a traditional carotid endarterectomy would be very difficult given this is a very high lesion with a carotid bifurcation at C2 and she is morbidly obese with limited neck mobility and has had previous cervical fusion with a retropharyngeal carotid bifurcation.  I will review her imaging with our rep for transcarotid artery stenting as I think this would be her best option but I have some concerns given the tortuosity as I discussed with her.  Would continue dual antiplatelet therapy.  We will  follow.  [name] 09/18/2020 9:14 AM --

## 2020-09-18 NOTE — Progress Notes (Signed)
STROKE TEAM PROGRESS NOTE   INTERVAL HISTORY No family at bedside. Patient sitting in chair, no complains. Stated that [name] left arm stronger than before. CTA head and neck done showed right ICA at least 50% stenosis. Dr. Carlis Abbott considered 70-80% stenosis. She has left ICA distal 10mm aneurysm. Plan for CEA vs. Right TCAR.   Vitals:   09/17/20 2100 09/18/20 0600 09/18/20 0630 09/18/20 0830  BP: (!) 150/76  (!) 166/101   Pulse: 82  82   Resp:      Temp: 97.6 F (36.4 C)  (!) 97.4 F (36.3 C)   TempSrc: Oral  Oral   SpO2: 97%  95% 95%  Weight:  106.9 kg    Height:       CBC:  Recent Labs  Lab 09/16/20 2059 09/17/20 0500  WBC 5.9 6.2  NEUTROABS 4.2 4.6  HGB 12.0 13.2  HCT 40.3 45.1  MCV 100.8* 100.0  PLT 104* 542*   Basic Metabolic Panel:  Recent Labs  Lab 09/16/20 1441 09/16/20 1447 09/17/20 0500  NA 138 139 136  K 4.5 4.6 4.3  CL 97* 95* 99  CO2 35*  --  26  GLUCOSE 128* 125* 127*  BUN 18 23 15   CREATININE 1.29* 1.20* 1.12*  CALCIUM 9.1  --  9.1   Lipid Panel:  Recent Labs  Lab 09/17/20 0500  CHOL 175  TRIG 173*  HDL 50  CHOLHDL 3.5  VLDL 35  LDLCALC 90   HgbA1c:  Recent Labs  Lab 09/17/20 0500  HGBA1C 6.5*    IMAGING past 24 hours CT HEAD WO CONTRAST (5MM)  Result Date: 09/17/2020 CLINICAL DATA:  Stroke, follow-up. EXAM: CT HEAD WITHOUT CONTRAST TECHNIQUE: Contiguous axial images were obtained from the base of the skull through the vertex without intravenous contrast. COMPARISON:  CT head without contrast 09/16/2020 FINDINGS: Brain: No acute infarct, hemorrhage, or mass lesion is present. Moderate white matter changes are stable. The ventricles are of normal size. No significant extraaxial fluid collection is present. The brainstem and cerebellum are within normal limits. Vascular: Atherosclerotic changes are again noted within the cavernous internal carotid arteries bilaterally. No hyperdense vessel is present. Skull: Calvarium is intact. No focal lytic  or blastic lesions are present. No significant extracranial soft tissue lesion is present. Sinuses/Orbits: The paranasal sinuses and mastoid air cells are clear. Bilateral lens replacements are noted. Globes and orbits are otherwise unremarkable. IMPRESSION: 1. Stable moderate white matter disease. This likely reflects the sequela of chronic microvascular ischemia. 2. No acute intracranial abnormality or significant interval change. Electronically Signed   By: San Morelle M.D.   On: 09/17/2020 15:58   CT ANGIO NECK W OR WO CONTRAST  Result Date: 09/18/2020 CLINICAL DATA:  75 year old female status post code stroke presentation on 09/16/2020. No acute intracranial abnormality by CT. EXAM: CT ANGIOGRAPHY NECK TECHNIQUE: Multidetector CT imaging of the neck was performed using the standard protocol during bolus administration of intravenous contrast. Multiplanar CT image reconstructions and MIPs were obtained to evaluate the vascular anatomy. Carotid stenosis measurements (when applicable) are obtained utilizing NASCET criteria, using the distal internal carotid diameter as the denominator. CONTRAST:  31mL OMNIPAQUE IOHEXOL 350 MG/ML SOLN COMPARISON:  Head CT 09/17/2020 and earlier. CT cervical spine 04/01/2020 FINDINGS: Skeleton: Prior C4-C5 ACDF with evidence of solid arthrodesis there. Advanced chronic cervical spine degeneration elsewhere with possible developing ankylosis at both C5-C6 and C6-C7. Prior sternotomy. Advanced upper thoracic disc and endplate degeneration. No acute osseous abnormality identified. Upper chest:  Right chest cardiac pacemaker. Sequelae of CABG. No superior mediastinal lymphadenopathy. Mild upper lung atelectasis. Other neck: Negative. Negative visible brain parenchyma, orbits. Aortic arch: Calcified aortic atherosclerosis. Prior CABG. Three vessel arch configuration. Right carotid system: Calcified plaque at the brachiocephalic artery and right CCA origins without stenosis.  Retropharyngeal course of the right CCA with intermittent calcified plaque proximal to the right carotid bifurcation. The bifurcation is also retropharyngeal, and there is bulky soft and calcified plaque at the right carotid bifurcation resulting in stenosis of both the right ECA and right ICA origins (series 9, image 103). Right ICA origin stenosis is 50 % with respect to the distal vessel. Tortuous right ICA distal to the bulb remains patent to the skull base. Left carotid system: Calcified plaque at the left CCA origin without stenosis. Partially retropharyngeal course of the left carotid including the left carotid bifurcation. Calcified plaque at the bifurcation results in less than 50 % stenosis with respect to the distal vessel of the left ICA which remains patent to the skull base. Mild calcified plaque below the skull base. Vertebral arteries: Soft and calcified plaque of the right subclavian artery origin results an 60% stenosis with respect to the distal vessel. Tortuous proximal right subclavian. Soft and calcified plaque at the right vertebral artery origin results in moderate origin stenosis. The right vertebral artery is otherwise patent to the skull base. Soft and calcified plaque of the proximal left subclavian artery results in less than 50% stenosis. Left vertebral artery origin is normal. But the left vertebral is non dominant and diminutive throughout the neck and to the skull base. Limited intracranial: Calcified plaque of the dominant right vertebral artery as it crosses the dura results in moderate stenosis on series 8, image 56. Additional right V4 calcified plaque with only mild stenosis. The right vertebral supplies the basilar which is diminutive but remains patent to the SCA origins. There are fetal type bilateral PCA origins. The non dominant left vertebral terminates in PICA. Both ICA siphons are patent but heavily calcified. Bilateral carotid termini are patent. There is a 5 mm  aneurysm of the distal left ICA at the posterior communicating artery origin, directed posteriorly and inferiorly (series 8, image 9). The left PCA is fetal. Review of the MIP images confirms the above findings IMPRESSION: 1. Intracranial aneurysm (5 mm) of the distal Left ICA inseparable from the left Pcomm which constitutes a fetal origin of the left PCA. Neuro-Endovascular consultation is suggested to evaluate the appropriateness of potential treatment. 2. Bulky soft and calcified plaque at the retropharyngeal right carotid bifurcation resulting in stenosis of both the Right ICA (50%) and ECA origins. Left carotid atherosclerosisWith less than 50% stenosis of the cervical left ICA. Both ICA siphons are patent but heavily calcified. 3. Right Subclavian Artery origin 60% stenosis. And superimposed Moderate stenoses of the dominant Right Vertebral Artery (which supplies the Basilar Artery) at both its origin and V4 segments. 4. Prior CABG. Aortic Atherosclerosis (ICD10-I70.0). 5. Prior C4-C5 ACDF with solid arthrodesis and advanced cervical spine degeneration elsewhere with suspected developing ankylosis at C5-C6 and C6-C7. Electronically Signed   By: Genevie Ann M.D.   On: 09/18/2020 06:35    PHYSICAL EXAM  Physical Exam  Constitutional: Appears well-developed and morbid obesity Psych: Affect appropriate to situation Eyes: Normal external eye and conjunctiva. HENT: Normocephalic, no lesions, without obvious abnormality.   Musculoskeletal-no joint tenderness, deformity or swelling Cardiovascular: Normal rate and regular rhythm.  Respiratory: Effort normal, non-labored breathing saturations WNL GI:  Soft.  No distension. There is no tenderness.  Skin: WDI   Neuro:  Mental Status: Alert, oriented, thought content appropriate.  Speech fluent without evidence of aphasia.  Able to follow commands without difficulty. Cranial Nerves: II: Visual fields grossly normal,  III,IV, VI: ptosis not present,  extra-ocular motions intact bilaterally pupils equal, round, reactive to light and accommodation V,VII: slight left nasolabial fold flattening, facial light touch sensation normal bilaterally VIII: hearing normal bilaterally IX,X: uvula rises symmetrically XI: bilateral shoulder shrug XII: midline tongue extension Motor: Right : Upper extremity   5/5  Left:     Upper extremity   5-/5  Lower extremity   5/5   Lower extremity   5/5 Tone and bulk:normal tone throughout; no atrophy noted Sensory: light touch intact throughout, bilaterally Cerebellar: normal finger-to-nose Gait: deferred    ASSESSMENT/PLAN [name] is a 75 y.o. female PMH significant for cardiomyopathy status post AICD placement, prior TIAs, diabetes, hypercholesterolemia, hypertension, obstructive sleep apnea who woke up at 0700 and 09/16/2020 and was at [name] baseline.  At 0900, she had abrupt onset left lower extremity weakness.  When [name] symptoms did not improve, she eventually called EMS.  Per EMS, she was noted to have a facial droop, aphasia, funny feeling and slurring of [name] speech.  Patient denied all of these symptoms to me and just endorsed left leg weakness.  Stroke - likely small left brain stroke due to right ICA stenosis  Code Stroke :No evidence of acute intracranial abnormality. CTA head & neck right ICA at least 50% stenosis. Left ICA bulb with less than 50% stenosis of the cervical left ICA. Both ICA siphons are patent but heavily calcified. Right Subclavian Artery origin 60% stenosis. And superimposed Moderate stenoses of the dominant Right VA (which supplies the BA) at both its origin and V4 segments. MRI  unable d/t ICD Carotid Doppler  2D Echo EF 55-60% Cardiology interrogated ICD with no evidence of AFib. LDL 90 HgbA1c 6.5 VTE prophylaxis - lovenox aspirin 81 mg daily and clopidogrel 75 mg daily prior to admission, now on aspirin 81 mg daily and clopidogrel 75 mg daily. Further regimen per  VVS.  Therapy recommendations:  HH OT/PT Disposition:  pending  Carotid stenosis CTA head and neck - Right ICA at least 50% stenosis. Left ICA bulb with less than 50% stenosis of the cervical left ICA.  CUS right ICA 80-99% stenosis, left ICA 40-59% stenosis VVS Dr. Carlis Abbott on board, considered right ICA 70-80% stenosis Plan for right ICA revascularization - CEA vs. TCAR  Hypertension Home meds:  torsemide Stable Avoid low BP given carotid stenosis Long-term BP goal 130-160 before the carotid revas procedure  Hyperlipidemia Home meds:  crestor 20 mg LDL 90, goal < 70 Increase crestor to 40mg  daily ( if pt. Can tolerate); if not go back to 20 mg daily Patient with documented leg cramps with atorvastatin. Continue statin at discharge  Diabetes type II  Home meds:  jardiance HgbA1c 6.5, goal < 7.0 CBGs SSI PCP follow up  Cerebral aneurysm CTA head Intracranial aneurysm (5 mm) of the distal Left ICA inseparable from the left Pcomm which constitutes a fetal origin of the left PCA. Will follow up with Dr. Estanislado Pandy as outpt   Other Stroke Risk Factors Advanced Age >/= 77  Obesity, Body mass index is 41.75 kg/m., BMI >/= 30 associated with increased stroke risk, recommend weight loss, diet and exercise as appropriate  Hx TIA Coronary artery disease Obstructive sleep apnea,  not on CPAP at home "can't tolerate mask" Congestive heart failure   Other risk factors   Hospital day # 0  Neurology will sign off. Please call with questions. Pt will follow up with stroke clinic NP at [facility] in about 4 weeks. Thanks for the consult.  Rosalin Hawking, MD PhD Stroke Neurology 09/18/2020 1:14 PM    To contact Stroke Continuity provider, please refer to [email or web address] After hours, contact General Neurology

## 2020-09-19 ENCOUNTER — Other Ambulatory Visit: Payer: Self-pay

## 2020-09-19 DIAGNOSIS — I2510 Atherosclerotic heart disease of native coronary artery without angina pectoris: Secondary | ICD-10-CM

## 2020-09-19 DIAGNOSIS — R079 Chest pain, unspecified: Secondary | ICD-10-CM | POA: Diagnosis not present

## 2020-09-19 DIAGNOSIS — E1122 Type 2 diabetes mellitus with diabetic chronic kidney disease: Secondary | ICD-10-CM | POA: Diagnosis not present

## 2020-09-19 DIAGNOSIS — N1831 Chronic kidney disease, stage 3a: Secondary | ICD-10-CM

## 2020-09-19 DIAGNOSIS — I10 Essential (primary) hypertension: Secondary | ICD-10-CM | POA: Insufficient documentation

## 2020-09-19 DIAGNOSIS — I729 Aneurysm of unspecified site: Secondary | ICD-10-CM

## 2020-09-19 DIAGNOSIS — I5032 Chronic diastolic (congestive) heart failure: Secondary | ICD-10-CM | POA: Diagnosis not present

## 2020-09-19 DIAGNOSIS — G4733 Obstructive sleep apnea (adult) (pediatric): Secondary | ICD-10-CM

## 2020-09-19 DIAGNOSIS — Z950 Presence of cardiac pacemaker: Secondary | ICD-10-CM | POA: Diagnosis not present

## 2020-09-19 DIAGNOSIS — N183 Chronic kidney disease, stage 3 unspecified: Secondary | ICD-10-CM | POA: Diagnosis not present

## 2020-09-19 DIAGNOSIS — Z8673 Personal history of transient ischemic attack (TIA), and cerebral infarction without residual deficits: Secondary | ICD-10-CM

## 2020-09-19 DIAGNOSIS — J9611 Chronic respiratory failure with hypoxia: Secondary | ICD-10-CM | POA: Diagnosis not present

## 2020-09-19 DIAGNOSIS — R531 Weakness: Secondary | ICD-10-CM | POA: Diagnosis not present

## 2020-09-19 LAB — GLUCOSE, CAPILLARY
Glucose-Capillary: 122 mg/dL — ABNORMAL HIGH (ref 70–99)
Glucose-Capillary: 120 mg/dL — ABNORMAL HIGH (ref 70–99)
Glucose-Capillary: 145 mg/dL — ABNORMAL HIGH (ref 70–99)

## 2020-09-19 MED ORDER — ROSUVASTATIN CALCIUM 40 MG PO TABS: 40.0000 mg | ORAL_TABLET | Freq: Every evening | ORAL | 2 refills | Status: DC

## 2020-09-19 NOTE — Care Management Obs Status (Signed)
[place] NOTIFICATION   Patient Details  Name: [name] MRN: [id number] Date of Birth: [birth year]   Medicare Observation Status Notification Given:  Yes    Geralynn Ochs, LCSW 09/19/2020, 1:38 PM

## 2020-09-19 NOTE — Progress Notes (Signed)
Discharge instruction given to the patient and she stated understanding. Questions and concerns answered. VS checked and within normal limit. IV and tele monitor discontinued. Patient waiting for her ride.

## 2020-09-19 NOTE — Progress Notes (Signed)
Patient discharged via wheel chair at this time with oxygen. Patient hemodynamically stable during discharge.

## 2020-09-19 NOTE — Plan of Care (Signed)

## 2020-09-19 NOTE — Progress Notes (Signed)
[facility] [place]  Subjective  -no additional neurologic events overnight   Objective 114/67 77 97.6 F (36.4 C) (Oral) 18 99%  Intake/Output Summary (Last 24 hours) at 09/19/2020 1138 Last data filed at 09/18/2020 2200 Gross per 24 hour  Intake 120 ml  Output --  Net 120 ml   Appears grossly neurologically intact  Laboratory Lab Results: Recent Labs    09/16/20 2059 09/17/20 0500  WBC 5.9 6.2  HGB 12.0 13.2  HCT 40.3 45.1  PLT 104* 138*   BMET Recent Labs    09/16/20 1441 09/16/20 1447 09/17/20 0500  NA 138 139 136  K 4.5 4.6 4.3  CL 97* 95* 99  CO2 35*  --  26  GLUCOSE 128* 125* 127*  BUN 18 23 15   CREATININE 1.29* 1.20* 1.12*  CALCIUM 9.1  --  9.1    COAG Lab Results  Component Value Date   INR 1.0 09/16/2020   INR SPECIMEN CLOTTED 09/16/2020   INR 0.9 04/01/2020   No results found for: PTT  Assessment/Planning:  75 year old female that vascular surgery was consulted for a high-grade right carotid stenosis in the ICA with suspected symptomatic lesion given slurred speech facial droop and left-sided weakness on presentation.  I ordered premedication for contrast allergy and she got a CTA neck that I think shows about a 60 to 70% right ICA stenosis with some associated mural thrombus.  She has very tortuous common carotid and ICA which I discussed with her.  She has a very high lesion with bifurcation at C2 and is morbidly obese.  I have reviewed her images with our TCAR rep and I think she would be a candidate for transcarotid stenting with flow reversal for neuro protection.  She is agreeable to proceed with right TCAR.  I offered this for this coming Monday but she wants the following week and we will schedule for Monday 8/22.  She can be discharged from my standpoint.  She needs to remain on aspirin Plavix and statin.  This does not need to be stopped for surgery.  My office will contact her to schedule  surgery.  [name] 09/19/2020 11:38 AM --

## 2020-09-19 NOTE — Discharge Summary (Signed)
Physician Discharge Summary  [name] WIO:[id number] DOB: 1945-12-02 DOA: 09/16/2020  PCP: Merryl Hacker, No  Admit date: 09/16/2020 Discharge date: 09/19/2020  Admitted From: Home  Discharge disposition: Home with PT.  Recommendations for Outpatient Follow-Up:   Follow up with your primary care provider in one week.  Follow-up with vascular surgery Dr.Clark as scheduled by the office to discuss about TCAR. Follow-up with [facility] [place] in 4 weeks.  Office to contact. Along with neuro intervention Dr. Estanislado Pandy, bilateral referral has been made for internal carotid artery aneurysm. Check CBC, BMP, magnesium in the next visit  Discharge Diagnosis:   Principal Problem:   Acute left-sided weakness Active Problems:   Essential hypertension   Asthma   Cardiac resynchronization therapy pacemaker (CRT-P) in place   Gastroesophageal reflux disease   OSA (obstructive sleep apnea)   Chronic diastolic CHF (congestive heart failure) (HCC)   History of TIA (transient ischemic attack)   CKD (chronic kidney disease), stage III (HCC)   CAD (coronary artery disease)   DM2 (diabetes mellitus, type 2) (HCC)   Chronic respiratory failure with hypoxia and hypercapnia ([place])   Chest pain   Aneurysm ([place])   Discharge Condition: Improved.  Diet recommendation: Low sodium, heart healthy.  Carbohydrate-modified.    Wound care: None.  Code status: Full.   History of Present Illness:   [name] is a 75 y.o. female with past medical history of coronary artery disease status post CABG x5 in 2016, multiple TIA on aspirin and Plavix, diastolic and systolic heart failure, chronic hypoxic respiratory failure on 3 L of oxygen by nasal cannula, CKD stage III, type 2 diabetes, hypertension, hyperlipidemia, obstructive sleep apnea, morbid obesity, status post biventricular AICD presented to hospital with left-sided acute weakness with facial droop.  CT head scan did not show any acute  stroke however carotid duplex ultrasound showed high-grade stenosis of the right ICA.  Vascular surgery was consulted including neurology.  Patient was then admitted to hospital for further evaluation and treatment  Hospital Course:   Following conditions were addressed during hospitalization as listed below,  Acute CVA likely secondary to high-grade right ICA stenosis.  Vascular surgery on board and suggesting TCAR for at least 60 to 70% stenosis.  Plan for TCAR as outpatient.  Vascular surgery recommends continuation of aspirin Plavix and statins on discharge .  He was also seen by neurology.  Recommend outpatient follow-up in 4 weeks.   Intracranial aneurysm (5 mm) of the distal left ICA: Patient to follow-up with Dr. Estanislado Pandy for ICA aneurysm as outpatient   Essential hypertension with hypertensive urgency.  Allowed for permissive hypertension high-grade stenosis with small stroke during hospitalization.  Will need outpatient monitoring.  CAD with history of CABG: On dual antiplatelets.   Had mild elevated troponins.  Improved.  Likely from demand ischemia.   Chronic systolic and diastolic CHF, ICD in place: Compensated .AICD was interrogated.  COPD with chronic hypoxic and hypercapnic respiratory failure: Uses 3 L of oxygen at baseline.  Continue bronchodilators.  Appears to be compensated.  Type II DM, Continue Jardiance.   CKD stage IIIa,  Continue to monitor renal function.  Monitor BMP   History of TIAs.  Continue statins and dual antiplatelet   OSA, chronic hypoxic respiratory failure on supplemental oxygen.   morbid obesity,  Would benefit from weight reduction.   GERD, Continue PPI  Disposition.  At this time, patient is stable for disposition home with outpatient PCP, neurology, interventional radiology, vascular surgery.  Medical Consultants:   Neurology Vascular surgery  Procedures:    None Subjective:   Today, patient was seen and examined at bedside.   Wishes to go home.  Denies any dizziness, lightheadedness headache shortness of breath.  Discharge Exam:   Vitals:   09/19/20 0831 09/19/20 1256  BP:  126/85  Pulse:  88  Resp:  18  Temp:  98 F (36.7 C)  SpO2: 99% 98%   Vitals:   09/19/20 0548 09/19/20 0737 09/19/20 0831 09/19/20 1256  BP: (!) 148/73 114/67  126/85  Pulse: 79 77  88  Resp: 19 18  18   Temp: 97.6 F (36.4 C)   98 F (36.7 C)  TempSrc: Oral   Oral  SpO2: 97% 100% 99% 98%  Weight:      Height:        General: Alert awake, not in obvious distress, morbidly obese, on nasal cannula HENT: pupils equally reacting to light,  No scleral pallor or icterus noted. Oral mucosa is moist.  Chest:  Clear breath sounds.  Diminished breath sounds bilaterally. No crackles or wheezes.  CVS: S1 &S2 heard. No murmur.  Regular rate and rhythm. Abdomen: Soft, nontender, nondistended.  Bowel sounds are heard.   Extremities: No cyanosis, clubbing or edema.  Peripheral pulses are palpable. Psych: Alert, awake and oriented, normal mood CNS:  No cranial nerve deficits.  Power equal in all extremities.   Skin: Warm and dry.  No rashes noted.  The results of significant diagnostics from this hospitalization (including imaging, microbiology, ancillary and laboratory) are listed below for reference.     Diagnostic Studies:   CT HEAD WO CONTRAST (5MM)  Result Date: 09/17/2020 CLINICAL DATA:  Stroke, follow-up. EXAM: CT HEAD WITHOUT CONTRAST TECHNIQUE: Contiguous axial images were obtained from the base of the skull through the vertex without intravenous contrast. COMPARISON:  CT head without contrast 09/16/2020 FINDINGS: Brain: No acute infarct, hemorrhage, or mass lesion is present. Moderate white matter changes are stable. The ventricles are of normal size. No significant extraaxial fluid collection is present. The brainstem and cerebellum are within normal limits. Vascular: Atherosclerotic changes are again noted within the cavernous  internal carotid arteries bilaterally. No hyperdense vessel is present. Skull: Calvarium is intact. No focal lytic or blastic lesions are present. No significant extracranial soft tissue lesion is present. Sinuses/Orbits: The paranasal sinuses and mastoid air cells are clear. Bilateral lens replacements are noted. Globes and orbits are otherwise unremarkable. IMPRESSION: 1. Stable moderate white matter disease. This likely reflects the sequela of chronic microvascular ischemia. 2. No acute intracranial abnormality or significant interval change. Electronically Signed   By: San Morelle M.D.   On: 09/17/2020 15:58   DG Chest Portable 1 View  Result Date: 09/16/2020 CLINICAL DATA:  chest pain EXAM: PORTABLE CHEST 1 VIEW COMPARISON:  April 01, 2020. FINDINGS: Enlarged cardiac silhouette. Central pulmonary vascular congestion. Tortuous aorta. Median sternotomy and CABG. Calcific atherosclerosis aorta. Bibasilar and right midlung opacities. Possible trace fluid layering along the right minor fissure. Otherwise, no visible pleural effusions or pneumothorax on this single semi erect radiograph. IMPRESSION: 1. Cardiomegaly and central pulmonary vascular congestion. Possible trace fluid layering along the right minor fissure. 2. Low lung volumes with right midlung and bibasilar opacities, most likely atelectasis. Infection is not excluded. Electronically Signed   By: Margaretha Sheffield MD   On: 09/16/2020 15:47   ECHOCARDIOGRAM COMPLETE  Result Date: 09/17/2020    ECHOCARDIOGRAM REPORT   Patient Name:   PATTIJO JUSTE  Papania Date of Exam: 09/17/2020 Medical Rec #:  [id number]         Height:       62.0 in Accession #:    [id number]        Weight:       240.1 lb Date of Birth:  January 22, 1946          BSA:          2.066 m Patient Age:    63 years          BP:           188/71 mmHg Patient Gender: F                 HR:           72 bpm. Exam Location:  Inpatient Procedure: 2D Echo Indications:    stroke  History:         Patient has prior history of Echocardiogram examinations, most                 recent 06/01/2019. CHF, Defibrillator and Pacemaker, chronic                 kidney disease; Risk Factors:Sleep Apnea, Hypertension,                 Dyslipidemia and Diabetes.  Sonographer:    Johny Chess Referring Phys: [id number] ALLISON WOLFE  Sonographer Comments: Patient is morbidly obese. Image acquisition challenging due to patient body habitus and heavy breasts. IMPRESSIONS  1. Left ventricular ejection fraction, by estimation, is 55 to 60%. The left ventricle has normal function. The left ventricle has no regional wall motion abnormalities. Left ventricular diastolic parameters are consistent with Grade I diastolic dysfunction (impaired relaxation). Elevated left atrial pressure.  2. Right ventricular systolic function is normal. The right ventricular size is normal.  3. Left atrial size was moderately dilated.  4. The mitral valve is normal in structure. Trivial mitral valve regurgitation. No evidence of mitral stenosis. Moderate mitral annular calcification.  5. The aortic valve has an indeterminant number of cusps. Aortic valve regurgitation is trivial. Mild aortic valve stenosis.  6. The inferior vena cava is normal in size with greater than 50% respiratory variability, suggesting right atrial pressure of 3 mmHg. FINDINGS  Left Ventricle: Left ventricular ejection fraction, by estimation, is 55 to 60%. The left ventricle has normal function. The left ventricle has no regional wall motion abnormalities. The left ventricular internal cavity size was normal in size. There is  no left ventricular hypertrophy. Left ventricular diastolic parameters are consistent with Grade I diastolic dysfunction (impaired relaxation). Elevated left atrial pressure. Right Ventricle: The right ventricular size is normal. Right ventricular systolic function is normal. Left Atrium: Left atrial size was moderately dilated. Right Atrium: Right atrial  size was normal in size. Pericardium: There is no evidence of pericardial effusion. Mitral Valve: The mitral valve is normal in structure. Moderate mitral annular calcification. Trivial mitral valve regurgitation. No evidence of mitral valve stenosis. Tricuspid Valve: The tricuspid valve is normal in structure. Tricuspid valve regurgitation is trivial. No evidence of tricuspid stenosis. Aortic Valve: The aortic valve has an indeterminant number of cusps. Aortic valve regurgitation is trivial. Mild aortic stenosis is present. Aortic valve mean gradient measures 9.5 mmHg. Aortic valve peak gradient measures 17.2 mmHg. Aortic valve area, by VTI measures 1.43 cm. Pulmonic Valve: The pulmonic valve was normal in structure. Pulmonic valve regurgitation is not visualized. No evidence of pulmonic stenosis.  Aorta: The aortic root is normal in size and structure. Venous: The inferior vena cava is normal in size with greater than 50% respiratory variability, suggesting right atrial pressure of 3 mmHg. IAS/Shunts: The interatrial septum was not well visualized. Additional Comments: A device lead is visualized.  LEFT VENTRICLE PLAX 2D LVIDd:         5.40 cm  Diastology LVIDs:         3.70 cm  LV e' medial:    3.92 cm/s LV PW:         1.20 cm  LV E/e' medial:  21.6 LV IVS:        1.20 cm  LV e' lateral:   8.27 cm/s LVOT diam:     2.10 cm  LV E/e' lateral: 10.2 LV SV:         63 LV SV Index:   30 LVOT Area:     3.46 cm  RIGHT VENTRICLE            IVC RV S prime:     8.49 cm/s  IVC diam: 2.00 cm LEFT ATRIUM             Index       RIGHT ATRIUM           Index LA diam:        4.20 cm 2.03 cm/m  RA Area:     14.10 cm LA Vol (A2C):   78.3 ml 37.90 ml/m RA Volume:   33.50 ml  16.21 ml/m LA Vol (A4C):   85.1 ml 41.19 ml/m LA Biplane Vol: 85.0 ml 41.14 ml/m  AORTIC VALVE AV Area (Vmax):    1.43 cm AV Area (Vmean):   1.42 cm AV Area (VTI):     1.43 cm AV Vmax:           207.50 cm/s AV Vmean:          142.000 cm/s AV VTI:             0.440 m AV Peak Grad:      17.2 mmHg AV Mean Grad:      9.5 mmHg LVOT Vmax:         85.60 cm/s LVOT Vmean:        58.200 cm/s LVOT VTI:          0.182 m LVOT/AV VTI ratio: 0.41  AORTA Ao Root diam: 3.70 cm Ao Asc diam:  3.50 cm MITRAL VALVE MV Area (PHT): 3.27 cm     SHUNTS MV Decel Time: 232 msec     Systemic VTI:  0.18 m MV E velocity: 84.70 cm/s   Systemic Diam: 2.10 cm MV A velocity: 120.00 cm/s MV E/A ratio:  0.71 Kirk Ruths MD Electronically signed by Kirk Ruths MD Signature Date/Time: 09/17/2020/12:41:21 PM    Final    CT HEAD CODE STROKE WO CONTRAST  Result Date: 09/16/2020 CLINICAL DATA:  Code stroke. Neuro deficit, acute, stroke suspected. Facial droop, slurred speech, and generalized weakness. EXAM: CT HEAD WITHOUT CONTRAST TECHNIQUE: Contiguous axial images were obtained from the base of the skull through the vertex without intravenous contrast. COMPARISON:  04/01/2020 FINDINGS: Brain: There is no evidence of an acute infarct, intracranial hemorrhage, mass, midline shift, or extra-axial fluid collection. Mild cerebral atrophy is within normal limits for age. Hypodensities in the cerebral white matter bilaterally are unchanged and nonspecific but compatible with mild chronic small vessel ischemic disease. There are several chronic punctate calcifications projecting over the left  cerebral convexity which may be within sulci and vascular. Vascular: Calcified atherosclerosis at the skull base. No hyperdense vessel. Skull: No fracture or suspicious osseous lesion. Sinuses/Orbits: Paranasal sinuses and mastoid air cells are clear. Bilateral cataract extraction. Other: None. ASPECTS [facility] Stroke Program Early CT Score) - Ganglionic level infarction (caudate, lentiform nuclei, internal capsule, insula, M1-M3 cortex): 7 - Supraganglionic infarction (M4-M6 cortex): 3 Total score (0-10 with 10 being normal): 10 IMPRESSION: 1. No evidence of acute intracranial abnormality. 2. ASPECTS is 10. 3. Mild  chronic small vessel ischemic disease. Code stroke imaging results were communicated on 09/16/2020 at 2:57 pm to Dr. Lorrin Goodell via telephone. Electronically Signed   By: Logan Bores M.D.   On: 09/16/2020 14:57   VAS US CAROTID (at [facility] and [facility] only)  Result Date: 09/17/2020 Carotid Arterial Duplex Study Patient Name:  NATTIE LAZENBY  Date of Exam:   09/17/2020 Medical Rec #: [id number]          Accession #:    [id number] Date of Birth: 1945-09-12           Patient Gender: F Patient Age:   61 years Exam Location:  [facility] Procedure:      VAS US CAROTID Referring Phys: Orma Flaming --------------------------------------------------------------------------------  Indications:       Weakness. Risk Factors:      Hypertension, hyperlipidemia, Diabetes, prior CVA. Other Factors:     OSA, morbid obesity, atria fibrillation, cardiomyopathy,                    AICD. Limitations        Today's exam was limited due to the body habitus of the                    patient and depth of vessels. Comparison Study:  Prior study done 10/24/15 Performing Technologist: Sharion Dove RVS  Examination Guidelines: A complete evaluation includes B-mode imaging, spectral Doppler, color Doppler, and power Doppler as needed of all accessible portions of each vessel. Bilateral testing is considered an integral part of a complete examination. Limited examinations for reoccurring indications may be performed as noted.  Right Carotid Findings: +----------+--------+--------+--------+------------------+------------------+           PSV cm/sEDV cm/sStenosisPlaque DescriptionComments           +----------+--------+--------+--------+------------------+------------------+ CCA Prox  97      23                                intimal thickening +----------+--------+--------+--------+------------------+------------------+ CCA Distal64      24                                intimal thickening  +----------+--------+--------+--------+------------------+------------------+ ICA Prox  532     177     80-99%  heterogenous      tortuous           +----------+--------+--------+--------+------------------+------------------+ ICA Distal73      22                                tortuous           +----------+--------+--------+--------+------------------+------------------+ ECA       372     46      >50%                                         +----------+--------+--------+--------+------------------+------------------+ +----------+--------+-------+--------+-------------------+  PSV cm/sEDV cmsDescribeArm Pressure (mmHG) +----------+--------+-------+--------+-------------------+ [id number]                                         +----------+--------+-------+--------+-------------------+ +---------+--------+--------+--------------+ VertebralPSV cm/sEDV cm/sNot identified +---------+--------+--------+--------------+  Left Carotid Findings: +----------+--------+--------+--------+------------------+------------------+           PSV cm/sEDV cm/sStenosisPlaque DescriptionComments           +----------+--------+--------+--------+------------------+------------------+ CCA Prox  63      16                                intimal thickening +----------+--------+--------+--------+------------------+------------------+ CCA Distal100     26                                intimal thickening +----------+--------+--------+--------+------------------+------------------+ ICA Prox  204     56      40-59%  heterogenous      tortuous           +----------+--------+--------+--------+------------------+------------------+ ICA Distal157     64                                tortuous           +----------+--------+--------+--------+------------------+------------------+ ECA       147     17                                                    +----------+--------+--------+--------+------------------+------------------+ +----------+--------+--------+--------+-------------------+           PSV cm/sEDV cm/sDescribeArm Pressure (mmHG) +----------+--------+--------+--------+-------------------+ [id number]                                          +----------+--------+--------+--------+-------------------+ +---------+--------+--------+--------------+ VertebralPSV cm/sEDV cm/sNot identified +---------+--------+--------+--------------+   Summary: Right Carotid: Velocities in the right ICA are consistent with a 80-99%                stenosis. Marked Increase in severity of stenosis since study                done 10/24/15. Left Carotid: Velocities in the left ICA are consistent with a 40-59% stenosis.               Stable since study done 10/24/15. Vertebrals:  Not assessed. Subclavians: Normal flow hemodynamics were seen in bilateral subclavian              arteries. *See table(s) above for measurements and observations.     Preliminary    VAS [place] TRANSCRANIAL DOPPLER  Result Date: 09/17/2020  Transcranial Doppler Patient Name:  ELIANY MCCARTER  Date of Exam:   09/17/2020 Medical Rec #: [id number]          Accession #:    [id number] Date of Birth: May 09, 1945           Patient Gender: F Patient Age:   43 years Exam Location:  [facility] Procedure:      VAS [place] TRANSCRANIAL DOPPLER Referring Phys: Orma Flaming --------------------------------------------------------------------------------  Indications: Stroke. History: History of TIA, CVA, carotid stenosis. Limitations for diagnostic windows: Unable to insonate right transtemporal window. Unable to insonate left transtemporal window. Comparison Study: Prior TCD done 10/24/15 Performing Technologist: Sharion Dove RVS Supporting Technologist: Darlin Coco RDMS,RVT  Examination Guidelines: A complete evaluation includes B-mode imaging, spectral Doppler, color Doppler, and power Doppler as  needed of all accessible portions of each vessel. Bilateral testing is considered an integral part of a complete examination. Limited examinations for reoccurring indications may be performed as noted.  +----------+-------------+----------+-----------+-------+ RIGHT TCD Right VM (cm)Depth (cm)PulsatilityComment +----------+-------------+----------+-----------+-------+ ACA          -21.00                 1.07            +----------+-------------+----------+-----------+-------+ Opthalmic     20.00                 0.86            +----------+-------------+----------+-----------+-------+ ICA siphon    35.00                 1.26            +----------+-------------+----------+-----------+-------+ Vertebral    -15.00                 1.24            +----------+-------------+----------+-----------+-------+  +----------+------------+----------+-----------+-------+ LEFT TCD  Left VM (cm)Depth (cm)PulsatilityComment +----------+------------+----------+-----------+-------+ Opthalmic    14.00                 1.28            +----------+------------+----------+-----------+-------+ ICA siphon   19.00                 1.22            +----------+------------+----------+-----------+-------+ Vertebral    -17.00                0.85            +----------+------------+----------+-----------+-------+  +------------+------+-------+             VM cm Comment +------------+------+-------+ Prox Basilar-29.00        +------------+------+-------+    Preliminary      Labs:   Basic Metabolic Panel: Recent Labs  Lab 09/16/20 1441 09/16/20 1447 09/17/20 0500  NA 138 139 136  K 4.5 4.6 4.3  CL 97* 95* 99  CO2 35*  --  26  GLUCOSE 128* 125* 127*  BUN 18 23 15   CREATININE 1.29* 1.20* 1.12*  CALCIUM 9.1  --  9.1   GFR Estimated Creatinine Clearance: 50.5 mL/min (A) (by C-G formula based on SCr of 1.12 mg/dL (H)). Liver Function Tests: Recent Labs  Lab  09/16/20 1441  AST 19  ALT 12  ALKPHOS 99  BILITOT 0.8  PROT 6.4*  ALBUMIN 3.1*   No results for input(s): LIPASE, AMYLASE in the last 168 hours. No results for input(s): AMMONIA in the last 168 hours. Coagulation profile Recent Labs  Lab 09/16/20 1441 09/16/20 1855  INR SPECIMEN CLOTTED 1.0    CBC: Recent Labs  Lab 09/16/20 1441 09/16/20 1447 09/16/20 2059 09/17/20 0500  WBC 7.1  --  5.9 6.2  NEUTROABS 5.2  --  4.2 4.6  HGB 13.1 15.0 12.0 13.2  HCT 43.3 44.0 40.3 45.1  MCV 98.6  --  100.8* 100.0  PLT 106*  --  104* 138*   Cardiac Enzymes: No results  for input(s): CKTOTAL, CKMB, CKMBINDEX, TROPONINI in the last 168 hours. BNP: Invalid input(s): POCBNP CBG: Recent Labs  Lab 09/18/20 1157 09/18/20 1619 09/18/20 2143 09/19/20 0752 09/19/20 1132  GLUCAP 262* 188* 168* 122* 120*   D-Dimer No results for input(s): DDIMER in the last 72 hours. Hgb A1c Recent Labs    09/17/20 0500  HGBA1C 6.5*   Lipid Profile Recent Labs    09/17/20 0500  CHOL 175  HDL 50  LDLCALC 90  TRIG 173*  CHOLHDL 3.5   Thyroid function studies No results for input(s): TSH, T4TOTAL, T3FREE, THYROIDAB in the last 72 hours.  Invalid input(s): FREET3 Anemia work up No results for input(s): VITAMINB12, FOLATE, FERRITIN, TIBC, IRON, RETICCTPCT in the last 72 hours. Microbiology Recent Results (from the past 240 hour(s))  Resp Panel by RT-PCR (Flu A&B, Covid) Nasopharyngeal Swab     Status: None   Collection Time: 09/16/20  2:31 PM   Specimen: Nasopharyngeal Swab; Nasopharyngeal(NP) swabs in vial transport medium  Result Value Ref Range Status   SARS Coronavirus 2 by RT PCR NEGATIVE NEGATIVE Final    Comment: (NOTE) SARS-CoV-2 target nucleic acids are NOT DETECTED.  The SARS-CoV-2 RNA is generally detectable in upper respiratory specimens during the acute phase of infection. The lowest concentration of SARS-CoV-2 viral copies this assay can detect is 138 copies/mL. A  negative result does not preclude SARS-Cov-2 infection and should not be used as the sole basis for treatment or other patient management decisions. A negative result may occur with  improper specimen collection/handling, submission of specimen other than nasopharyngeal swab, presence of viral mutation(s) within the areas targeted by this assay, and inadequate number of viral copies(<138 copies/mL). A negative result must be combined with clinical observations, patient history, and epidemiological information. The expected result is Negative.  Fact Sheet for Patients:  EntrepreneurPulse.com.au  Fact Sheet for Healthcare Providers:  IncredibleEmployment.be  This test is no t yet approved or cleared by the [place] FDA and  has been authorized for detection and/or diagnosis of SARS-CoV-2 by FDA under an Emergency Use Authorization (EUA). This EUA will remain  in effect (meaning this test can be used) for the duration of the COVID-19 declaration under Section 564(b)(1) of the Act, 21 U.S.C.section 360bbb-3(b)(1), unless the authorization is terminated  or revoked sooner.       Influenza A by PCR NEGATIVE NEGATIVE Final   Influenza B by PCR NEGATIVE NEGATIVE Final    Comment: (NOTE) The Xpert Xpress SARS-CoV-2/FLU/RSV plus assay is intended as an aid in the diagnosis of influenza from Nasopharyngeal swab specimens and should not be used as a sole basis for treatment. Nasal washings and aspirates are unacceptable for Xpert Xpress SARS-CoV-2/FLU/RSV testing.  Fact Sheet for Patients: EntrepreneurPulse.com.au  Fact Sheet for Healthcare Providers: IncredibleEmployment.be  This test is not yet approved or cleared by the [place] FDA and has been authorized for detection and/or diagnosis of SARS-CoV-2 by FDA under an Emergency Use Authorization (EUA). This EUA will remain in effect (meaning this test can  be used) for the duration of the COVID-19 declaration under Section 564(b)(1) of the Act, 21 U.S.C. section 360bbb-3(b)(1), unless the authorization is terminated or revoked.  Performed at [facility], [place] [address], [place], [place] [address]      Discharge Instructions:   Discharge Instructions     Ambulatory referral to Interventional Radiology   Complete by: As directed    Ambulatory referral to Neurology   Complete by: As  directed    Follow up with stroke clinic NP (Jessica [place] or Cecille Rubin, if both not available, consider Zachery Dauer, or [place]) at [facility] in about 4 weeks. Thanks.   Diet - low sodium heart healthy   Complete by: As directed    Discharge instructions   Complete by: As directed    Please follow-up with stroke clinic in about 4 weeks (Clinic to schedule an appointment with you).  Follow-up with Dr. Carlis Abbott, vascular surgery as outpatient to schedule for surgery ( Office to contact you). Follow up with interventional Radiology in about one month to discuss about aneurysm.  Continue to take all the medications without interruption.   Increase activity slowly   Complete by: As directed       Allergies as of 09/19/2020       Reactions   Ivp Dye [iodinated Diagnostic Agents] Shortness Of Breath   No reaction to PO contrast with non-ionic dye.06-25-2014/rsm   Shellfish Allergy Anaphylaxis   Sulfa Antibiotics Shortness Of Breath   Iodine Hives   Atorvastatin Other (See Comments)   Pt states "causes bilateral leg pain/cramps."   Benadryl [diphenhydramine] Other (See Comments)   "THIS DRIVES ME CRAZY AND MAKES ME FEEL LIKE I AM DYING"   Colchicine Nausea And Vomiting   Contrast Media [iodinated Diagnostic Agents] Hives   Doxycycline Other (See Comments)   Unknown reaction, per patient   Uloric [febuxostat] Other (See Comments)   Unknown reaction   Cephalexin Itching, Rash   Zithromax [azithromycin] Rash        Medication List      STOP taking these medications    bisoprolol 5 MG tablet Commonly known as: ZEBETA   diclofenac sodium 1 % Gel Commonly known as: VOLTAREN       TAKE these medications    Accu-Chek Aviva Plus test strip Generic drug: glucose blood   Accu-Chek Softclix Lancets lancets   acetaminophen 500 MG tablet Commonly known as: TYLENOL Take 1,000 mg by mouth 2 (two) times daily as needed (for pain or headaches).   albuterol 108 (90 Base) MCG/ACT inhaler Commonly known as: VENTOLIN HFA Inhale 2 puffs into the lungs every 6 (six) hours as needed for wheezing or shortness of breath. What changed: when to take this   albuterol (2.5 MG/3ML) 0.083% nebulizer solution Commonly known as: PROVENTIL Take 3 mLs (2.5 mg total) by nebulization every 6 (six) hours as needed for wheezing or shortness of breath. What changed: Another medication with the same name was changed. Make sure you understand how and when to take each.   allopurinol 100 MG tablet Commonly known as: ZYLOPRIM TAKE 1 TABLET BY MOUTH  DAILY What changed: when to take this   aspirin EC 81 MG tablet Take 1 tablet (81 mg total) by mouth daily.   B-D SINGLE USE SWABS REGULAR Pads   budesonide-formoterol 160-4.5 MCG/ACT inhaler Commonly known as: SYMBICORT Inhale 2 puffs into the lungs 2 (two) times daily.   carboxymethylcellulose 0.5 % Soln Commonly known as: REFRESH PLUS Place 1 drop into both eyes 3 (three) times daily.   clopidogrel 75 MG tablet Commonly known as: PLAVIX Take 1 tablet (75 mg total) by mouth daily. What changed: when to take this   cycloSPORINE 0.05 % ophthalmic emulsion Commonly known as: RESTASIS Place 1 drop into both eyes every 12 (twelve) hours.   EPINEPHrine 0.3 mg/0.3 mL Soaj injection Commonly known as: EPI-PEN Inject 0.3 mg into the muscle once as needed (severe allergic reaction).  ferrous sulfate 325 (65 FE) MG tablet Take 325 mg by mouth daily with breakfast.   fluticasone 50  MCG/ACT nasal spray Commonly known as: FLONASE Place 1 spray into both nostrils daily.   Hair/Skin/Nails/Biotin Tabs Take 1 tablet by mouth every morning.   ipratropium 0.02 % nebulizer solution Commonly known as: ATROVENT Take 2.5 mLs (0.5 mg total) by nebulization 4 (four) times daily. What changed:  when to take this reasons to take this   isosorbide mononitrate 30 MG 24 hr tablet Commonly known as: IMDUR Take 3 tablets (90 mg total) by mouth daily.   Jardiance 10 MG Tabs tablet Generic drug: empagliflozin Take 10 mg by mouth daily before breakfast.   loratadine 10 MG tablet Commonly known as: CLARITIN Take 10 mg by mouth daily.   nitroGLYCERIN 0.4 MG SL tablet Commonly known as: NITROSTAT Place 1 tablet (0.4 mg total) under the tongue every 5 (five) minutes as needed for chest pain.   nystatin cream Commonly known as: MYCOSTATIN Apply 1 application topically 2 (two) times daily as needed (rash).   OXYGEN Inhale 3 L/min into the lungs continuous.   pantoprazole 40 MG tablet Commonly known as: PROTONIX Take 1 tablet (40 mg total) by mouth 2 (two) times daily.   polyethylene glycol 17 g packet Commonly known as: MIRALAX / GLYCOLAX Take 17 g by mouth daily.   potassium chloride SA 20 MEQ tablet Commonly known as: KLOR-CON Take 20 mEq by mouth every morning.   predniSONE 10 MG tablet Commonly known as: DELTASONE Take 10 mg by mouth daily as needed (as directed for gout flares).   ranolazine 1000 MG SR tablet Commonly known as: RANEXA TAKE 1 TABLET BY MOUTH  TWICE DAILY   rosuvastatin 40 MG tablet Commonly known as: CRESTOR Take 1 tablet (40 mg total) by mouth every evening. NEED OV. What changed:  medication strength how much to take when to take this   torsemide 20 MG tablet Commonly known as: DEMADEX Take 1 tablet (20 mg total) by mouth daily. What changed: when to take this        Follow-up Information     Luanne Bras, MD. Schedule  an appointment as soon as possible for a visit in 1 month(s).   Specialties: Interventional Radiology, Radiology Contact information: [place] [address] [phone]         [facility]. Schedule an appointment as soon as possible for a visit in 1 month(s).   Specialty: Neurology Why: stroke clinic Contact information: [address] [place] [phone]        [name], MD Follow up.   Specialty: Vascular Surgery Why: office to contact for surgery likely on 8/22 Contact information: [place] [address] [phone]                  Time coordinating discharge: 39 minutes  Signed:  Tyhir Schwan  [facility] 09/19/2020, 1:09 PM

## 2020-09-19 NOTE — TOC Transition Note (Signed)
Transition of Care [facility]) - CM/SW Discharge Note   Patient Details  Name: JAZZALYN LOEWENSTEIN MRN: [id number] Date of Birth: 1945-08-17  Transition of Care [facility]) CM/SW Contact:  Geralynn Ochs, LCSW Phone Number: 09/19/2020, 1:39 PM   Clinical Narrative:  CSW met with patient to discuss home health. Patient has used [facility] before, would like to use them again and asked for [facility]. CSW spoke with Meg with [facility] and provided referral, they can accept. No other needs at this time. Patient said her son will provide transport home.     Final next level of care: [place] Barriers to Discharge: Barriers Resolved   Patient Goals and CMS Choice Patient states their goals for this hospitalization and ongoing recovery are:: to get home CMS Medicare.gov Compare Post Acute Care list provided to:: Patient Choice offered to / list presented to : Patient  Discharge Placement                Patient to be transferred to facility by: Family car Name of family member notified: Self Patient and family notified of of transfer: 09/19/20  Discharge Plan and Services                          HH Arranged: PT [facility] Agency: [place] Date [facility] Agency Contacted: 09/19/20   Representative spoke with at [place]: Meg  Social Determinants of Health ([place]) Interventions     Readmission Risk Interventions No flowsheet data found.

## 2020-09-21 DIAGNOSIS — Z9981 Dependence on supplemental oxygen: Secondary | ICD-10-CM | POA: Diagnosis not present

## 2020-09-21 DIAGNOSIS — N1831 Chronic kidney disease, stage 3a: Secondary | ICD-10-CM | POA: Diagnosis not present

## 2020-09-21 DIAGNOSIS — I69354 Hemiplegia and hemiparesis following cerebral infarction affecting left non-dominant side: Secondary | ICD-10-CM | POA: Diagnosis not present

## 2020-09-21 DIAGNOSIS — I6521 Occlusion and stenosis of right carotid artery: Secondary | ICD-10-CM | POA: Diagnosis not present

## 2020-09-21 DIAGNOSIS — I5032 Chronic diastolic (congestive) heart failure: Secondary | ICD-10-CM | POA: Diagnosis not present

## 2020-09-21 DIAGNOSIS — E119 Type 2 diabetes mellitus without complications: Secondary | ICD-10-CM | POA: Diagnosis not present

## 2020-09-21 DIAGNOSIS — J9611 Chronic respiratory failure with hypoxia: Secondary | ICD-10-CM | POA: Diagnosis not present

## 2020-09-21 DIAGNOSIS — I130 Hypertensive heart and chronic kidney disease with heart failure and stage 1 through stage 4 chronic kidney disease, or unspecified chronic kidney disease: Secondary | ICD-10-CM | POA: Diagnosis not present

## 2020-09-21 DIAGNOSIS — I720 Aneurysm of carotid artery: Secondary | ICD-10-CM | POA: Diagnosis not present

## 2020-09-21 DIAGNOSIS — I2510 Atherosclerotic heart disease of native coronary artery without angina pectoris: Secondary | ICD-10-CM | POA: Diagnosis not present

## 2020-09-24 NOTE — Progress Notes (Signed)
Surgical Instructions    Your procedure is scheduled on Monday, August 22nd, 2022.   Report to [facility] Main Entrance "A" at 08:00 A.M., then check in with the Admitting office.  Call this number if you have problems the morning of surgery:  [phone]   If you have any questions prior to your surgery date call [phone]: Open Monday-Friday 8am-4pm    Remember:  Do not eat or drink after midnight the night before your surgery    Take these medicines the morning of surgery with A SIP OF WATER:  allopurinol (ZYLOPRIM)  budesonide-formoterol (SYMBICORT) - please, bring the inhaler with you the day of surgery carboxymethylcellulose (REFRESH PLUS) cycloSPORINE (RESTASIS) fluticasone (FLONASE)  ipratropium (ATROVENT)  isosorbide mononitrate (IMDUR) loratadine (CLARITIN) pantoprazole (PROTONIX) ranolazine (RANEXA)   If needed:     acetaminophen (TYLENOL) albuterol (PROVENTIL HFA;VENTOLIN HFA) - please, bring the inhaler with you EPINEPHrine  nitroGLYCERIN (NITROSTAT)  predniSONE (DELTASONE)   Follow your surgeon's instructions on when to stop Aspirin and Plavix.  If no instructions were given by your surgeon then you will need to call the office to get those instructions.     As of today, STOP taking any Aspirin (unless otherwise instructed by your surgeon) Aleve, Naproxen, Ibuprofen, Motrin, Advil, Goody's, BC's, all herbal medications, fish oil, and all vitamins.  WHAT DO I DO ABOUT MY DIABETES MEDICATION?   Do not take empagliflozin (JARDIANCE) the day before surgery and  the day of surgery.   HOW TO MANAGE YOUR DIABETES BEFORE AND AFTER SURGERY  Why is it important to control my blood sugar before and after surgery? Improving blood sugar levels before and after surgery helps healing and can limit problems. A way of improving blood sugar control is eating a healthy diet by:  Eating less sugar and carbohydrates  Increasing activity/exercise  Talking with your  doctor about reaching your blood sugar goals High blood sugars (greater than 180 mg/dL) can raise your risk of infections and slow your recovery, so you will need to focus on controlling your diabetes during the weeks before surgery. Make sure that the doctor who takes care of your diabetes knows about your planned surgery including the date and location.  How do I manage my blood sugar before surgery? Check your blood sugar at least 4 times a day, starting 2 days before surgery, to make sure that the level is not too high or low.  Check your blood sugar the morning of your surgery when you wake up and every 2 hours until you get to the Short Stay unit.  If your blood sugar is less than 70 mg/dL, you will need to treat for low blood sugar: Do not take insulin. Treat a low blood sugar (less than 70 mg/dL) with  cup of clear juice (cranberry or apple), 4 glucose tablets, OR glucose gel. Recheck blood sugar in 15 minutes after treatment (to make sure it is greater than 70 mg/dL). If your blood sugar is not greater than 70 mg/dL on recheck, call [phone] for further instructions. Report your blood sugar to the short stay nurse when you get to Short Stay.  If you are admitted to the hospital after surgery: Your blood sugar will be checked by the staff and you will probably be given insulin after surgery (instead of oral diabetes medicines) to make sure you have good blood sugar levels. The goal for blood sugar control after surgery is 80-180 mg/dL.  Do not wear jewelry or makeup Do not wear lotions, powders, perfumes or deodorant. Do not shave 48 hours prior to surgery.  Do not bring valuables to the hospital. DO Not wear nail polish, gel polish, artificial nails, or any other type of covering on natural nails including finger and toenails. If patients have artificial nails, gel coating, etc. that need to be removed by a nail salon please have this removed prior to surgery or surgery  may need to be canceled/delayed if the surgeon/ anesthesia feels like the patient is unable to be adequately monitored.             [facility] is not responsible for any belongings or valuables.  Do NOT Smoke (Tobacco/Vaping) or drink Alcohol 24 hours prior to your procedure If you use a CPAP at night, you may bring all equipment for your overnight stay.   Contacts, glasses, dentures or bridgework may not be worn into surgery, please bring cases for these belongings   For patients admitted to the hospital, discharge time will be determined by your treatment team.   Patients discharged the day of surgery will not be allowed to drive home, and someone needs to stay with them for 24 hours.  ONLY 1 SUPPORT PERSON MAY BE PRESENT WHILE YOU ARE IN SURGERY. IF YOU ARE TO BE ADMITTED ONCE YOU ARE IN YOUR ROOM YOU WILL BE ALLOWED TWO (2) VISITORS.  Minor children may have two parents present. Special consideration for safety and communication needs will be reviewed on a case by case basis.  Special instructions:    Oral Hygiene is also important to reduce your risk of infection.  Remember - BRUSH YOUR TEETH THE MORNING OF SURGERY WITH YOUR REGULAR TOOTHPASTE   [place]- Preparing For Surgery  Before surgery, you can play an important role. Because skin is not sterile, your skin needs to be as free of germs as possible. You can reduce the number of germs on your skin by washing with CHG (chlorahexidine gluconate) Soap before surgery.  CHG is an antiseptic cleaner which kills germs and bonds with the skin to continue killing germs even after washing.     Please do not use if you have an allergy to CHG or antibacterial soaps. If your skin becomes reddened/irritated stop using the CHG.  Do not shave (including legs and underarms) for at least 48 hours prior to first CHG shower. It is OK to shave your face.  Please follow these instructions carefully.     Shower the NIGHT BEFORE SURGERY and the  MORNING OF SURGERY with CHG Soap.   If you chose to wash your hair, wash your hair first as usual with your normal shampoo. After you shampoo, rinse your hair and body thoroughly to remove the shampoo.  Then [employer] and genitals (private parts) with your normal soap and rinse thoroughly to remove soap.  After that Use CHG Soap as you would any other liquid soap. You can apply CHG directly to the skin and wash gently with a scrungie or a clean washcloth.   Apply the CHG Soap to your body ONLY FROM THE NECK DOWN.  Do not use on open wounds or open sores. Avoid contact with your eyes, ears, mouth and genitals (private parts). Wash Face and genitals (private parts)  with your normal soap.   Wash thoroughly, paying special attention to the area where your surgery will be performed.  Thoroughly rinse your body with warm water from the neck  down.  DO NOT shower/wash with your normal soap after using and rinsing off the CHG Soap.  Pat yourself dry with a CLEAN TOWEL.  Wear CLEAN PAJAMAS to bed the night before surgery  Place CLEAN SHEETS on your bed the night before your surgery  DO NOT SLEEP WITH PETS.   Day of Surgery:  Take a shower with CHG soap. Wear Clean/Comfortable clothing the morning of surgery Do not apply any deodorants/lotions.   Remember to brush your teeth WITH YOUR REGULAR TOOTHPASTE.   Please read over the following fact sheets that you were given.

## 2020-09-25 ENCOUNTER — Inpatient Hospital Stay (HOSPITAL_COMMUNITY)
Admission: RE | Admit: 2020-09-25 | Discharge: 2020-09-25 | Disposition: A | Discharge status: Home / Self Care | Payer: Medicare Other | Source: Ambulatory Visit

## 2020-09-25 DIAGNOSIS — I5032 Chronic diastolic (congestive) heart failure: Secondary | ICD-10-CM | POA: Diagnosis not present

## 2020-09-25 DIAGNOSIS — I130 Hypertensive heart and chronic kidney disease with heart failure and stage 1 through stage 4 chronic kidney disease, or unspecified chronic kidney disease: Secondary | ICD-10-CM | POA: Diagnosis not present

## 2020-09-25 DIAGNOSIS — I6521 Occlusion and stenosis of right carotid artery: Secondary | ICD-10-CM | POA: Diagnosis not present

## 2020-09-25 DIAGNOSIS — Z9981 Dependence on supplemental oxygen: Secondary | ICD-10-CM | POA: Diagnosis not present

## 2020-09-25 DIAGNOSIS — I720 Aneurysm of carotid artery: Secondary | ICD-10-CM | POA: Diagnosis not present

## 2020-09-25 DIAGNOSIS — I2510 Atherosclerotic heart disease of native coronary artery without angina pectoris: Secondary | ICD-10-CM | POA: Diagnosis not present

## 2020-09-25 DIAGNOSIS — J9611 Chronic respiratory failure with hypoxia: Secondary | ICD-10-CM | POA: Diagnosis not present

## 2020-09-25 DIAGNOSIS — E119 Type 2 diabetes mellitus without complications: Secondary | ICD-10-CM | POA: Diagnosis not present

## 2020-09-25 DIAGNOSIS — N1831 Chronic kidney disease, stage 3a: Secondary | ICD-10-CM | POA: Diagnosis not present

## 2020-09-25 DIAGNOSIS — I69354 Hemiplegia and hemiparesis following cerebral infarction affecting left non-dominant side: Secondary | ICD-10-CM | POA: Diagnosis not present

## 2020-09-25 NOTE — Progress Notes (Addendum)
Patient had PAT appointment today @ 2PM but she didn't come. This Probation officer called the patient with no success. Patient's son, Zakhia Seres was called and asked about his mother. Patient's son tried to reach his mom and she verbalized that she forgot about this appointment but she couldn't come today anyway because she doesn't have a ride. Patient verbalized that the only day she can come for this appointment is 09/26/2020. The surgical scheduler was informed. Also, Dr. Carlis Abbott was informed via [place].

## 2020-09-26 ENCOUNTER — Encounter: Payer: Self-pay | Admitting: Cardiovascular Disease

## 2020-09-26 ENCOUNTER — Encounter (HOSPITAL_COMMUNITY): Payer: Self-pay

## 2020-09-26 ENCOUNTER — Other Ambulatory Visit: Payer: Self-pay

## 2020-09-26 ENCOUNTER — Encounter (HOSPITAL_COMMUNITY)
Admission: RE | Admit: 2020-09-26 | Discharge: 2020-09-26 | Disposition: A | Discharge status: Home / Self Care | Payer: Medicare Other | Source: Ambulatory Visit | Attending: Vascular Surgery | Admitting: Vascular Surgery

## 2020-09-26 DIAGNOSIS — I130 Hypertensive heart and chronic kidney disease with heart failure and stage 1 through stage 4 chronic kidney disease, or unspecified chronic kidney disease: Secondary | ICD-10-CM | POA: Insufficient documentation

## 2020-09-26 DIAGNOSIS — Z8673 Personal history of transient ischemic attack (TIA), and cerebral infarction without residual deficits: Secondary | ICD-10-CM | POA: Diagnosis not present

## 2020-09-26 DIAGNOSIS — Z9981 Dependence on supplemental oxygen: Secondary | ICD-10-CM | POA: Diagnosis not present

## 2020-09-26 DIAGNOSIS — R531 Weakness: Secondary | ICD-10-CM | POA: Insufficient documentation

## 2020-09-26 DIAGNOSIS — I2510 Atherosclerotic heart disease of native coronary artery without angina pectoris: Secondary | ICD-10-CM | POA: Insufficient documentation

## 2020-09-26 DIAGNOSIS — Z7982 Long term (current) use of aspirin: Secondary | ICD-10-CM | POA: Insufficient documentation

## 2020-09-26 DIAGNOSIS — Z01812 Encounter for preprocedural laboratory examination: Secondary | ICD-10-CM | POA: Diagnosis not present

## 2020-09-26 DIAGNOSIS — Z7902 Long term (current) use of antithrombotics/antiplatelets: Secondary | ICD-10-CM | POA: Diagnosis not present

## 2020-09-26 DIAGNOSIS — Z79899 Other long term (current) drug therapy: Secondary | ICD-10-CM | POA: Insufficient documentation

## 2020-09-26 DIAGNOSIS — K219 Gastro-esophageal reflux disease without esophagitis: Secondary | ICD-10-CM | POA: Insufficient documentation

## 2020-09-26 DIAGNOSIS — Z7984 Long term (current) use of oral hypoglycemic drugs: Secondary | ICD-10-CM | POA: Insufficient documentation

## 2020-09-26 DIAGNOSIS — I509 Heart failure, unspecified: Secondary | ICD-10-CM | POA: Diagnosis not present

## 2020-09-26 DIAGNOSIS — D649 Anemia, unspecified: Secondary | ICD-10-CM | POA: Diagnosis not present

## 2020-09-26 DIAGNOSIS — E785 Hyperlipidemia, unspecified: Secondary | ICD-10-CM | POA: Diagnosis not present

## 2020-09-26 DIAGNOSIS — N183 Chronic kidney disease, stage 3 unspecified: Secondary | ICD-10-CM | POA: Insufficient documentation

## 2020-09-26 DIAGNOSIS — K760 Fatty (change of) liver, not elsewhere classified: Secondary | ICD-10-CM | POA: Diagnosis not present

## 2020-09-26 DIAGNOSIS — Z6841 Body Mass Index (BMI) 40.0 and over, adult: Secondary | ICD-10-CM | POA: Insufficient documentation

## 2020-09-26 DIAGNOSIS — E1122 Type 2 diabetes mellitus with diabetic chronic kidney disease: Secondary | ICD-10-CM | POA: Diagnosis not present

## 2020-09-26 DIAGNOSIS — G4733 Obstructive sleep apnea (adult) (pediatric): Secondary | ICD-10-CM | POA: Insufficient documentation

## 2020-09-26 DIAGNOSIS — Z87891 Personal history of nicotine dependence: Secondary | ICD-10-CM | POA: Diagnosis not present

## 2020-09-26 HISTORY — DX: Presence of cardiac pacemaker: Z95.0

## 2020-09-26 LAB — COMPREHENSIVE METABOLIC PANEL
ALT: 9 U/L (ref 0–44)
AST: 17 U/L (ref 15–41)
Albumin: 2.9 g/dL — ABNORMAL LOW (ref 3.5–5.0)
Alkaline Phosphatase: 97 U/L (ref 38–126)
Anion gap: 9 (ref 5–15)
BUN: 21 mg/dL (ref 8–23)
CO2: 30 mmol/L (ref 22–32)
Calcium: 8.9 mg/dL (ref 8.9–10.3)
Chloride: 99 mmol/L (ref 98–111)
Creatinine, Ser: 1.54 mg/dL — ABNORMAL HIGH (ref 0.44–1.00)
GFR, Estimated: 35 mL/min — ABNORMAL LOW (ref >60)
Glucose, Bld: 220 mg/dL — ABNORMAL HIGH (ref 70–99)
Potassium: 3.8 mmol/L (ref 3.5–5.1)
Sodium: 138 mmol/L (ref 135–145)
Total Bilirubin: 0.6 mg/dL (ref 0.3–1.2)
Total Protein: 5.9 g/dL — ABNORMAL LOW (ref 6.5–8.1)

## 2020-09-26 LAB — URINALYSIS, ROUTINE W REFLEX MICROSCOPIC
Bacteria, UA: NONE SEEN
Bilirubin Urine: NEGATIVE
Glucose, UA: >=500 mg/dL — AB
Hgb urine dipstick: NEGATIVE
Ketones, ur: NEGATIVE mg/dL
Leukocytes,Ua: NEGATIVE
Nitrite: NEGATIVE
Protein, ur: NEGATIVE mg/dL
Specific Gravity, Urine: 1.013 (ref 1.005–1.030)
pH: 5 (ref 5.0–8.0)

## 2020-09-26 LAB — CBC
HCT: 41.6 % (ref 36.0–46.0)
Hemoglobin: 12.6 g/dL (ref 12.0–15.0)
MCH: 29.7 pg (ref 26.0–34.0)
MCHC: 30.3 g/dL (ref 30.0–36.0)
MCV: 98.1 fL (ref 80.0–100.0)
Platelets: 145 10*3/uL — ABNORMAL LOW (ref 150–400)
RBC: 4.24 MIL/uL (ref 3.87–5.11)
RDW: 14.1 % (ref 11.5–15.5)
WBC: 7.7 10*3/uL (ref 4.0–10.5)
nRBC: 0 % (ref 0.0–0.2)

## 2020-09-26 LAB — PROTIME-INR
INR: 0.9 (ref 0.8–1.2)
Prothrombin Time: 12.3 seconds (ref 11.4–15.2)

## 2020-09-26 LAB — BLOOD GAS, ARTERIAL
Acid-Base Excess: 8.5 mmol/L — ABNORMAL HIGH (ref 0.0–2.0)
Bicarbonate: 34 mmol/L — ABNORMAL HIGH (ref 20.0–28.0)
Drawn by: 58793
FIO2: 21
O2 Saturation: 95.6 %
Patient temperature: 37
pCO2 arterial: 61.8 mmHg — ABNORMAL HIGH (ref 32.0–48.0)
pH, Arterial: 7.359 (ref 7.350–7.450)
pO2, Arterial: 85.4 mmHg (ref 83.0–108.0)

## 2020-09-26 LAB — TYPE AND SCREEN
ABO/RH(D): O POS
Antibody Screen: NEGATIVE

## 2020-09-26 LAB — SURGICAL PCR SCREEN
MRSA, PCR: NEGATIVE
Staphylococcus aureus: NEGATIVE

## 2020-09-26 LAB — GLUCOSE, CAPILLARY: Glucose-Capillary: 190 mg/dL — ABNORMAL HIGH (ref 70–99)

## 2020-09-26 LAB — APTT: aPTT: 30 seconds (ref 24–36)

## 2020-09-26 NOTE — Progress Notes (Signed)
PCP - Judi Cong MD Cardiologist - Sanda Klein MD  PPM/ICD - [facility] [employer] ICD Device Orders - staff message sent to [facility] Rep Notified - [phone]  Chest x-ray -09/16/20 EKG - 09/24/20 Stress Test -  ECHO - 09/17/20 Cardiac Cath - 03/11/19  Sleep Study - yes CPAP - pt states she could not tolerate it.Does not have one.  Fasting Blood Sugar - 100-120 Checks Blood Sugar two times a day  Blood Thinner Instructions: continue taking Plavix per Dr.Clark Aspirin Instructions:Continue taking Aspirin per Dr. Carlis Abbott  ERAS Protcol -no PRE-SURGERY Ensure or G2-   COVID TEST- Pt  given directions and paperwork to get Covid test on 09/27/20   Anesthesia review: yes-cardiac history;continuous home O2-3L  Patient denies shortness of breath, fever, cough and chest pain at PAT appointment   All instructions explained to the patient, with a verbal understanding of the material. Patient agrees to go over the instructions while at home for a better understanding. Patient also instructed to self quarantine after being tested for COVID-19. The opportunity to ask questions was provided.

## 2020-09-26 NOTE — Progress Notes (Signed)
PERIOPERATIVE PRESCRIPTION FOR IMPLANTED CARDIAC DEVICE PROGRAMMING  Patient Information: Name:  QUIANNA AVERY  DOB:  [birth year]  MRN:  [id number]    Planned Procedure: right transcarotid artery revascularization  Surgeon:  Monica Martinez MD  Date of Procedure:  10/01/20  Cautery will be used.  Position during surgery:  supine   Please send documentation back to:  [facility] (Fax # [phone])   Device Information:  Clinic EP Physician:  Dani Gobble Croitoru   Device Type:  Pacemaker Manufacturer and Phone #:  [employer]: [phone] Pacemaker Dependent?:  Yes.   Date of Last Device Check:  01/17/2020 Normal Device Function?:  Yes.    Electrophysiologist's Recommendations:  Have magnet available. Provide continuous ECG monitoring when magnet is used or reprogramming is to be performed.  Procedure may interfere with device function.  Magnet should be placed over device during procedure.  Per Device Clinic Standing Orders, Simone Curia, RN  8:17 PM 09/26/2020

## 2020-09-27 ENCOUNTER — Encounter (HOSPITAL_COMMUNITY): Payer: Self-pay

## 2020-09-27 ENCOUNTER — Encounter (HOSPITAL_COMMUNITY): Payer: Self-pay | Admitting: Vascular Surgery

## 2020-09-27 ENCOUNTER — Other Ambulatory Visit: Payer: Self-pay | Admitting: Vascular Surgery

## 2020-09-27 DIAGNOSIS — I130 Hypertensive heart and chronic kidney disease with heart failure and stage 1 through stage 4 chronic kidney disease, or unspecified chronic kidney disease: Secondary | ICD-10-CM | POA: Diagnosis not present

## 2020-09-27 DIAGNOSIS — E119 Type 2 diabetes mellitus without complications: Secondary | ICD-10-CM | POA: Diagnosis not present

## 2020-09-27 DIAGNOSIS — I5032 Chronic diastolic (congestive) heart failure: Secondary | ICD-10-CM | POA: Diagnosis not present

## 2020-09-27 DIAGNOSIS — J9611 Chronic respiratory failure with hypoxia: Secondary | ICD-10-CM | POA: Diagnosis not present

## 2020-09-27 DIAGNOSIS — I6521 Occlusion and stenosis of right carotid artery: Secondary | ICD-10-CM | POA: Diagnosis not present

## 2020-09-27 DIAGNOSIS — I69354 Hemiplegia and hemiparesis following cerebral infarction affecting left non-dominant side: Secondary | ICD-10-CM | POA: Diagnosis not present

## 2020-09-27 DIAGNOSIS — N1831 Chronic kidney disease, stage 3a: Secondary | ICD-10-CM | POA: Diagnosis not present

## 2020-09-27 DIAGNOSIS — I255 Ischemic cardiomyopathy: Secondary | ICD-10-CM | POA: Insufficient documentation

## 2020-09-27 DIAGNOSIS — Z9981 Dependence on supplemental oxygen: Secondary | ICD-10-CM | POA: Diagnosis not present

## 2020-09-27 DIAGNOSIS — I2510 Atherosclerotic heart disease of native coronary artery without angina pectoris: Secondary | ICD-10-CM | POA: Diagnosis not present

## 2020-09-27 DIAGNOSIS — I720 Aneurysm of carotid artery: Secondary | ICD-10-CM | POA: Diagnosis not present

## 2020-09-27 NOTE — Anesthesia Preprocedure Evaluation (Addendum)
Anesthesia Evaluation  Patient identified by MRN, date of birth, ID band Patient awake    Reviewed: Allergy & Precautions, NPO status , Patient's Chart, lab work & pertinent test results  History of Anesthesia Complications Negative for: history of anesthetic complications  Airway Mallampati: IV  TM Distance: <3 FB Neck ROM: Full    Dental  (+) Dental Advisory Given, Partial Upper   Pulmonary shortness of breath, with exertion, at rest and Long-Term Oxygen Therapy, asthma , sleep apnea (noncompliant w/ CPAP) , former smoker,     + wheezing (given nebulizer tx in preop with improvement)      Cardiovascular hypertension, Pt. on medications + CAD, + Cardiac Stents, + CABG and +CHF  Normal cardiovascular exam+ dysrhythmias Atrial Fibrillation + pacemaker (Pacer dependent for CHB) + Cardiac Defibrillator    '22 TTE - EF 55 to 60%. Grade I diastolic dysfunction (impaired relaxation). Elevated left atrial pressure. Left atrial size was moderately dilated. Trivial MR. Trivial AI. Mild AS.  '21 Cath: Ost 1st Diag lesion is 60% stenosed. Ost 2nd Diag lesion is 50% stenosed. Mid LAD lesion is 100% stenosed. Non-stenotic Ost Cx to Mid Cx lesion was previously treated. Prox RCA lesion is 100% stenosed. Non-stenotic Ost Ramus to Ramus lesion was previously treated. Origin to Prox Graft lesion before Ramus is 100% stenosed. Findings: Ao = 136/68 (95)  LV = 155/11 RA = 7 RV = 34/9 PA = 34/17 (24) PCW = 14 Fick cardiac output/index = 6.8/3.1 PVR = 1.5 WU FA sat = 96% PA sat = 65%, 75%, 69% Assessment: 1. Stable coronary anatomy with no change from previous. SVG to OM system chronically occluded with previous PCI of native LCX system. Remainder of grafts are widely patent 2. Very mild PAH with normal left-sided pressures. 3. Very difficult procedure given patient's size and fidgeting on cath table Plan/Discussion: Medical therapy.       Neuro/Psych  Headaches, TIACVA, No Residual Symptoms negative psych ROS   GI/Hepatic Neg liver ROS, hiatal hernia, GERD  Medicated and Controlled,  Endo/Other  diabetes, Type 2, Oral Hypoglycemic Agents  Renal/GU CRFRenal disease     Musculoskeletal  (+) Arthritis ,   Abdominal   Peds  Hematology  Plt 145k On plavix    Anesthesia Other Findings Covid test negative   Reproductive/Obstetrics                          Anesthesia Physical Anesthesia Plan  ASA: 4  Anesthesia Plan: General   Post-op Pain Management:    Induction: Intravenous  PONV Risk Score and Plan: 3 and Treatment may vary due to age or medical condition, Ondansetron and Aprepitant  Airway Management Planned: Oral ETT and Video Laryngoscope Planned  Additional Equipment: Arterial line  Intra-op Plan:   Post-operative Plan: Extubation in OR  Informed Consent: I have reviewed the patients History and Physical, chart, labs and discussed the procedure including the risks, benefits and alternatives for the proposed anesthesia with the patient or authorized representative who has indicated his/her understanding and acceptance.     Dental advisory given  Plan Discussed with: CRNA and Anesthesiologist  Anesthesia Plan Comments: (See PAT note written 09/27/2020 by Myra Gianotti, PA-C. CAD s/p CABG 2016, DES x4 2018. [employer] CRT-P. Pacer dependent. Home O2 2-3 L. Symptomatic RICA stenosis, likely CVA 09/16/20. Continuing DAPT. )     Anesthesia Quick Evaluation

## 2020-09-27 NOTE — Progress Notes (Signed)
Anesthesia Chart Review:  Case: [id number] Date/Time: 10/01/20 0945   Procedure: RIGHT TRANSCAROTID ARTERY REVASCULARIZATION (Right)   Anesthesia type: General   Pre-op diagnosis: Acute left sided weakness   Location: [facility] OR ROOM 16 / [place] [facility]   Surgeons: [name], MD       DISCUSSION: Patient is a 75 year old female scheduled for the above procedure.  History includes former smoker (0.75 pack years, quit 02/11/68) asthma, chronic bronchitis, oxygen dependent (2-3L O2), HTN, HLD, CAD (s/p CABG 11/29/14: LIMA-LAD, SVG-D1, SVG-OM1-OM2, SVG-PDA; occluded SVG-LCX-OM, s/p DES x3 AV groove CX & DES x1 Intermediate 10/31/16; stable anatomy with patent stents 03/11/19), CHF, non-ischemic CM (06/2002, last LVEF 55-60% 09/17/20), implantable cardiac device (PPM 05/28/99 for syncope/Mobitz II-->explant with implant/upgrade BiV ICD 11/03/06 for severe CM, lead revision 08/09/07-->generator change out with downgrade from CRT-D to CRT-P 11/02/12), CKD (stage III), OSA (intolerant to CPAP), DM2, TIA (2018, likely CVA 09/16/20), GERD, hiatal hernia, anemia, nonalcoholic fatty liver disease, spinal surgery (C4-5 ACDF 11/17/05), cholecystectomy (04/09/18), morbid obesity.   - [place] admission 09/16/20-09/19/20 for acute left sided weakness and facial droop. CT did not show an acute CVA (no MRI due to PPM), but carotid US showed high grade RICA stenosis. CTA neck showed 5 mm distal LICA intracranial aneurysm.  Vascular surgery and neurology consulted.  It was felt that she likely had had a small left brain stroke due to right ICA stenosis.  Outpatient right TCAR planned, as well as IR follow-up for ICA aneurysm.  Aspirin and Plavix continued.   Last cardiology visit with Dr. Olevia Perches was 08/08/19.  Asymptomatic from a CAD standpoint.  On lifelong dual antiplatelet therapy.  She has a [place] PPM placed in 2014. She is pacer dependent. PPM was initially placed 05/28/99 for syncope/Mobitz II but explanted with  implant/upgrade to BiV ICD 11/03/06 for severe CM. She required lead revision 08/09/07. On 11/02/12 she was downgraded from CRT-D to CRT-P "following excellent response to resynchronization (hyper-responder, normalization of left ventricular systolic function." He added, "The current atrial and right ventricular leads were implanted in 2001, the left ventricular lead was implanted in 2008. The defibrillator lead implanted in 2008 is capped and its tip was resected at the time of bypass surgery." She developed HF symptoms again in Fall 2020 with EF 40-45% (previously normal in 2017) and LA pressures increased. Noted she had lost LV capture and output increased in the coronary sinus lead. He noted, "Recent data from [employer] raises concern about possible unexpected reset of the pacemaker generator to the [employer] settings.  This occurs potentially during the increased sudden battery drain during remote transmissions.  We have decided to perform device checks only in the office from this point forward." LVEF 55-60% 09/17/20.    Perioperative Cardiac Device Rx: Device Information: Clinic EP Physician:  [name]    Device Type:  Pacemaker Manufacturer and Phone #:  [employer]: [phone] Pacemaker Dependent?:  Yes.   Date of Last Device Check:  01/17/2020       Normal Device Function?:  Yes.     Electrophysiologist's Recommendations:  Have magnet available. Provide continuous ECG monitoring when magnet is used or reprogramming is to be performed.  Procedure may interfere with device function.  Magnet should be placed over device during procedure.  She is to continue Plavix and aspirin per Dr. Carlis Abbott.  She is for preoperative COVID-19 testing on 09/27/2020.  Anesthesia team to evaluate on the day of surgery.  VS: BP 104/60   Pulse 82   Temp 36.9 C (Oral)   Resp 18   Ht 5\' 3"  (1.6 m)   Wt 108.1 kg   SpO2 96%   BMI 42.23 kg/m    PROVIDERS: Lujean Amel, MD is  PCP Sanda Klein, MD is Cardiologist. Last visit 08/08/19. Cristopher Peru, MD is EP. Last 12/05/14. PPM now followed by Dr. [name]. Glori Bickers, MD is HF cardiologist. Last visit 06/30/19. Pearson Grippe, MD is nephrologist Christinia Gully, MD is Pulmonologist. Last visit 04/13/18 with Wyn Quaker, NP. Newman Pies, MD is neurosurgeon Rosalin Hawking, MD is neurologist [phone] in-patient evaluation) Luanne Bras, MD is IR pending referral for ICA aneurysm (09/2020)   LABS: Preoperative labs noted. Creatinine 1.54 with range of 1.12-1.98 since 03/2020.   (all labs ordered are listed, but only abnormal results are displayed)  Labs Reviewed  GLUCOSE, CAPILLARY - Abnormal; Notable for the following components:      Result Value   Glucose-Capillary 190 (*)    All other components within normal limits  CBC - Abnormal; Notable for the following components:   Platelets 145 (*)    All other components within normal limits  COMPREHENSIVE METABOLIC PANEL - Abnormal; Notable for the following components:   Glucose, Bld 220 (*)    Creatinine, Ser 1.54 (*)    Total Protein 5.9 (*)    Albumin 2.9 (*)    GFR, Estimated 35 (*)    All other components within normal limits  URINALYSIS, ROUTINE W REFLEX MICROSCOPIC - Abnormal; Notable for the following components:   Glucose, UA >=500 (*)    All other components within normal limits  BLOOD GAS, ARTERIAL - Abnormal; Notable for the following components:   pCO2 arterial 61.8 (*)    Bicarbonate 34.0 (*)    Acid-Base Excess 8.5 (*)    Allens test (pass/fail) BRACHIAL ARTERY (*)    All other components within normal limits  SURGICAL PCR SCREEN  PROTIME-INR  APTT  TYPE AND SCREEN     IMAGES: CTA Neck 09/18/20: IMPRESSION: 1. Intracranial aneurysm (5 mm) of the distal Left ICA inseparable from the left Pcomm which constitutes a fetal origin of the left PCA. Neuro-Endovascular consultation is suggested to evaluate the appropriateness of  potential treatment.   2. Bulky soft and calcified plaque at the retropharyngeal right carotid bifurcation resulting in stenosis of both the Right ICA (50%) and ECA origins. Left carotid atherosclerosisWith less than 50% stenosis of the cervical left ICA. Both ICA siphons are patent but heavily calcified.   3. Right Subclavian Artery origin 60% stenosis. And superimposed Moderate stenoses of the dominant Right Vertebral Artery (which supplies the Basilar Artery) at both its origin and V4 segments.   4. Prior CABG. Aortic Atherosclerosis (ICD10-I70.0).   5. Prior C4-C5 ACDF with solid arthrodesis and advanced cervical spine degeneration elsewhere with suspected developing ankylosis at C5-C6 and C6-C7.    1V PCXR 09/16/20: FINDINGS: Enlarged cardiac silhouette. Central pulmonary vascular congestion. Tortuous aorta. Median sternotomy and CABG. Calcific atherosclerosis aorta. Bibasilar and right midlung opacities. Possible trace fluid layering along the right minor fissure. Otherwise, no visible pleural effusions or pneumothorax on this single semi erect radiograph. IMPRESSION: 1. Cardiomegaly and central pulmonary vascular congestion. Possible trace fluid layering along the right minor fissure. 2. Low lung volumes with right midlung and bibasilar opacities, most likely atelectasis. Infection is not excluded.     EKG: 09/16/20: Atrial-sensed ventricular-paced rhythm When compared with ECG of EARLIER SAME DATE No significant  change was found Confirmed by Delora Fuel ([id number]) on 09/16/2020 11:00:47 PM   CV: US Carotid 09/17/20: Summary:  - Right Carotid: Velocities in the right ICA are consistent with a 80-99% stenosis. Seems to have marked Increase in severity of stenosis since study done 10/24/15 although a very difficult study at that time.  - Left Carotid: Velocities in the left ICA are consistent with a 40-59% stenosis. Stable since study done 10/24/15.  - Vertebrals:  Not  assessed.  - Subclavians: Normal flow hemodynamics were seen in bilateral subclavian arteries.    Transcranial Doppler 09/17/20: Summary:  no b/l transtemporal window to insonate b/l MCAs, ACAs or PCAs. prior TCD  in 10/2015 also showed no above windows.  Mean Flow Velocities of insonated b/l ICA siphons, opthalmic As and VAs,  as well as BA were within normal limits. Normal flow directions.    Echo 09/17/20: IMPRESSIONS   1. Left ventricular ejection fraction, by estimation, is 55 to 60%. The  left ventricle has normal function. The left ventricle has no regional  wall motion abnormalities. Left ventricular diastolic parameters are  consistent with Grade I diastolic  dysfunction (impaired relaxation). Elevated left atrial pressure.   2. Right ventricular systolic function is normal. The right ventricular  size is normal.   3. Left atrial size was moderately dilated.   4. The mitral valve is normal in structure. Trivial mitral valve  regurgitation. No evidence of mitral stenosis. Moderate mitral annular  calcification.   5. The aortic valve has an indeterminant number of cusps. Aortic valve  regurgitation is trivial. Mild aortic valve stenosis.   6. The inferior vena cava is normal in size with greater than 50%  respiratory variability, suggesting right atrial pressure of 3 mmHg.    RHC/LHC 03/11/19:  Ost 1st Diag lesion is 60% stenosed. Ost 2nd Diag lesion is 50% stenosed. Mid LAD lesion is 100% stenosed. Non-stenotic Ost Cx to Mid Cx lesion was previously treated. Prox RCA lesion is 100% stenosed. Non-stenotic Ost Ramus to Ramus lesion was previously treated. Origin to Prox Graft lesion before Ramus is 100% stenosed.   Findings:  Ao = 136/68 (95)  LV =  155/11 RA =  7 RV = 34/9 PA = 34/17 (24) PCW = 14 Fick cardiac output/index = 6.8/3.1 PVR = 1.5 WU FA sat = 96% PA sat = 65%, 75%, 69%   Assessment: 1. Stable coronary anatomy with no change from previous. SVG to OM  system chronically occluded with previous PCI of native LCX system. Remainder of grafts are widely patent 2. Very mild PAH with normal left-sided pressures. 3. Very difficult procedure given patient's size and fidgeting on cath table   Plan/Discussion: Medical therapy.      Past Medical History:  Diagnosis Date   ABDOMINAL PAIN -GENERALIZED 02/21/2010   Qualifier: Diagnosis of  By: Chester Holstein NP, Nevin Bloodgood     AICD (automatic cardioverter/defibrillator) present    Anemia    Arthritis    Asthma    Atrial fibrillation ([place]) 10/24/2015   Questionable history of A Fib/A Flutter. On device interrogation on 9/7, showed 0.74min of A Fib/A Flutter with 1% burden.  Device check in Jan 2018 showed no sustained AF (runs of less than 30 seconds). No anticoagulation indicated.   CAD (coronary artery disease)    a. 10/09/16 LHC: SVG->LAD patent, SVG->Diag patent, SVG->RCA patent, SVG->LCx occluded. EF 60%, b. 10/31/16 LHC DES to AV groove Circ, DES to intermed branch   Cellulitis and abscess of foot  12/2014   RT FOOT   CHF (congestive heart failure) (HCC)    Chronic bronchitis (HCC)    Chronic lower back pain    Complete heart block ([place]) 06/22/2015   Diverticulosis    Facial numbness 02/2016   Fatty liver disease, nonalcoholic    Gastritis    GERD (gastroesophageal reflux disease)    Gout    H/O hiatal hernia    HEMORRHOIDS-EXTERNAL 02/21/2010   Qualifier: Diagnosis of  By: Chester Holstein NP, Paula     High cholesterol    Hyperplastic colon polyp    Hypertension    IBS (irritable bowel syndrome)    Internal hemorrhoids    INTERNAL HEMORRHOIDS WITHOUT MENTION COMP 04/12/2007   Qualifier: Diagnosis of  By: Olevia Perches MD, Lowella Bandy    Ischemic cardiomyopathy    Nonischemic cardiomyopathy ([place]) 11/22/2011   Pt responded to BiV ICD- last EF 55-60% Sept 2017   Obesity    On home oxygen therapy    "2L at night" (10/31/2016)   OSA (obstructive sleep apnea)    "can't tolerate a mask" (10/30/2016)   PERSONAL HX  COLONIC POLYPS 02/21/2010   Qualifier: Diagnosis of  By: Chester Holstein NP, Paula     Pneumonia    "couple times" (10/31/2016)   Right facial numbness 03/05/2016   Shortness of breath    TIA (transient ischemic attack)    "recently" (10/31/2016)   Type II diabetes mellitus ([place])     Past Surgical History:  Procedure Laterality Date   ABDOMINAL ULTRASOUND  12/01/2011   Peripelvic cysts- #1- 2.4x1.9x2.3cm, #2-1.2x0.9x1.2cm   ANKLE FRACTURE SURGERY Right    "had rod put in"   ANTERIOR CERVICAL DECOMP/DISCECTOMY FUSION     APPENDECTOMY     BACK SURGERY     BIOPSY  12/29/2017   Procedure: BIOPSY;  Surgeon: [name], MD;  Location: [facility];  Service: Endoscopy;;   BIV ICD INSERTION CRT-D  2001?   BIV PACEMAKER GENERATOR CHANGE OUT N/A 11/02/2012   Procedure: BIV PACEMAKER GENERATOR CHANGE OUT;  Surgeon: Sanda Klein, MD;  Location: [facility] CATH LAB;  Service: Cardiovascular;  Laterality: N/A;   CARDIAC CATHETERIZATION  05/17/1999   No significant coronary obstructive disease w/ mild 20% luminal irregularity of the first diag branch of the LAD   CARDIAC CATHETERIZATION  07/08/2002   No significant CAD, moderately depressed LV systolic function   CARDIAC CATHETERIZATION Bilateral 04/26/2007   Normal findings, recommend medical therapy   CARDIAC CATHETERIZATION  02/18/2008   Moderate CAD, would benefit from having a functional study, recommend continue medical therapy   CARDIAC CATHETERIZATION  07/23/2012   Medical therapy   CARDIAC CATHETERIZATION N/A 11/24/2014   Procedure: Left Heart Cath and Coronary Angiography;  Surgeon: Troy Sine, MD;  Location: [place] CV LAB;  Service: Cardiovascular;  Laterality: N/A;   CARDIAC CATHETERIZATION  11/27/2014   Procedure: Intravascular Pressure Wire/FFR Study;  Surgeon: Peter M Martinique, MD;  Location: [place] [facility] CV LAB;  Service: Cardiovascular;;   CARDIAC CATHETERIZATION  10/09/2016   CHOLECYSTECTOMY N/A 04/09/2018   Procedure: LAPAROSCOPIC  CHOLECYSTECTOMY;  Surgeon: Greer Pickerel, MD;  Location: [facility];  Service: General;  Laterality: N/A;   COLONOSCOPY WITH PROPOFOL N/A 12/29/2017   Procedure: COLONOSCOPY WITH PROPOFOL;  Surgeon: [name], MD;  [facility];  Service: Endoscopy;  Laterality: N/A;   CORONARY ANGIOGRAM  2010   CORONARY ARTERY BYPASS GRAFT N/A 11/29/2014   Procedure: CORONARY ARTERY BYPASS GRAFTING x 5 (LIMA-LAD, SVG-D, SVG-OM1-OM2, SVG-PD);  Surgeon: Melrose Nakayama, MD;  Location: [facility] [facility];  Service: Open Heart Surgery;  Laterality: N/A;   CORONARY STENT INTERVENTION N/A 10/31/2016   Procedure: CORONARY STENT INTERVENTION;  Surgeon: Burnell Blanks, MD;  Location: [place] [facility] CV LAB;  Service: Cardiovascular;  Laterality: N/A;   ESOPHAGOGASTRODUODENOSCOPY (EGD) WITH PROPOFOL N/A 12/29/2017   Procedure: ESOPHAGOGASTRODUODENOSCOPY (EGD) WITH PROPOFOL;  Surgeon: [name], MD;  [facility];  Service: Endoscopy;  Laterality: N/A;   FRACTURE SURGERY     INSERT / REPLACE / [place] ARTHROSCOPY Bilateral    LEFT HEART CATH AND CORS/GRAFTS ANGIOGRAPHY N/A 12/09/2017   Procedure: LEFT HEART CATH AND CORS/GRAFTS ANGIOGRAPHY;  Surgeon: Troy Sine, MD;  Location: [place] CV LAB;  Service: Cardiovascular;  Laterality: N/A;   LEFT HEART CATHETERIZATION WITH CORONARY ANGIOGRAM N/A 07/23/2012   Procedure: LEFT HEART CATHETERIZATION WITH CORONARY ANGIOGRAM;  Surgeon: Leonie Man, MD;  [facility] CATH LAB;  Service: Cardiovascular;  Laterality: N/A;   LEXISCAN MYOVIEW  11/14/2011   Mild-moderate defect seen in Mid Inferolateral and Mid Anterolateral regions-consistant w/ infarct/scar. No significant ischemia demonstrated.   POLYPECTOMY  12/29/2017   Procedure: POLYPECTOMY;  Surgeon: [name], MD;  [facility];  Service: Endoscopy;;   RIGHT/LEFT HEART CATH AND CORONARY ANGIOGRAPHY N/A 10/09/2016   Procedure: RIGHT/LEFT HEART  CATH AND CORONARY ANGIOGRAPHY;  Surgeon: Jolaine Artist, MD;  Location: [place] CV LAB;  Service: Cardiovascular;  Laterality: N/A;   RIGHT/LEFT HEART CATH AND CORONARY/GRAFT ANGIOGRAPHY N/A 03/11/2019   Procedure: RIGHT/LEFT HEART CATH AND CORONARY/GRAFT ANGIOGRAPHY;  Surgeon: Jolaine Artist, MD;  Location: [place] CV LAB;  Service: Cardiovascular;  Laterality: N/A;   TEE WITHOUT CARDIOVERSION N/A 11/29/2014   Procedure: TRANSESOPHAGEAL ECHOCARDIOGRAM (TEE);  Surgeon: Melrose Nakayama, MD;  Location: [facility];  Service: Open Heart Surgery;  Laterality: N/A;   TRANSTHORACIC ECHOCARDIOGRAM  07/23/2012   EF 55-60%, normal-mild   TUBAL LIGATION      MEDICATIONS:  ACCU-CHEK AVIVA PLUS test strip   Accu-Chek Softclix Lancets lancets   acetaminophen (TYLENOL) 500 MG tablet   albuterol (PROVENTIL HFA;VENTOLIN HFA) 108 (90 Base) MCG/ACT inhaler   albuterol (PROVENTIL) (2.5 MG/3ML) 0.083% nebulizer solution   Alcohol Swabs (B-D SINGLE USE SWABS REGULAR) PADS   allopurinol (ZYLOPRIM) 100 MG tablet   aspirin EC 81 MG tablet   budesonide-formoterol (SYMBICORT) 160-4.5 MCG/ACT inhaler   carboxymethylcellulose (REFRESH PLUS) 0.5 % SOLN   clopidogrel (PLAVIX) 75 MG tablet   cycloSPORINE (RESTASIS) 0.05 % ophthalmic emulsion   empagliflozin (JARDIANCE) 10 MG TABS tablet   EPINEPHrine 0.3 mg/0.3 mL IJ SOAJ injection   ferrous sulfate 325 (65 FE) MG tablet   fluticasone (FLONASE) 50 MCG/ACT nasal spray   ipratropium (ATROVENT) 0.02 % nebulizer solution   isosorbide mononitrate (IMDUR) 30 MG 24 hr tablet   loratadine (CLARITIN) 10 MG tablet   Multiple Vitamins-Minerals (HAIR/SKIN/NAILS/BIOTIN) TABS   nitroGLYCERIN (NITROSTAT) 0.4 MG SL tablet   nystatin cream (MYCOSTATIN)   OXYGEN   pantoprazole (PROTONIX) 40 MG tablet   polyethylene glycol (MIRALAX / GLYCOLAX) 17 g packet   potassium chloride SA (KLOR-CON) 20 MEQ tablet   predniSONE (DELTASONE) 10 MG tablet   ranolazine (RANEXA)  1000 MG SR tablet   rosuvastatin (CRESTOR) 40 MG tablet   torsemide (DEMADEX) 20 MG tablet   No current facility-administered medications for this encounter.  She is on prednisone 10 mg daily as needed for gout flares.  Myra Gianotti, PA-C Surgical Short Stay/Anesthesiology [facility] [place] Phone [phone] [facility] Phone [phone] 09/27/2020 4:53 PM

## 2020-09-28 LAB — SARS CORONAVIRUS 2 (TAT 6-24 HRS): SARS Coronavirus 2: NEGATIVE

## 2020-10-01 ENCOUNTER — Inpatient Hospital Stay (HOSPITAL_COMMUNITY): Payer: Medicare Other | Admitting: Vascular Surgery

## 2020-10-01 ENCOUNTER — Encounter (HOSPITAL_COMMUNITY)
Admission: RE | Disposition: A | Discharge status: Home Health | Payer: Self-pay | Source: Home / Self Care | Attending: Vascular Surgery

## 2020-10-01 ENCOUNTER — Inpatient Hospital Stay (HOSPITAL_COMMUNITY)
Admission: RE | Admit: 2020-10-01 | Discharge: 2020-10-03 | DRG: 035 | Disposition: A | Discharge status: Home Health | Payer: Medicare Other | Attending: Vascular Surgery | Admitting: Vascular Surgery

## 2020-10-01 ENCOUNTER — Encounter (HOSPITAL_COMMUNITY): Payer: Self-pay | Admitting: Vascular Surgery

## 2020-10-01 ENCOUNTER — Inpatient Hospital Stay (HOSPITAL_COMMUNITY): Payer: Medicare Other

## 2020-10-01 ENCOUNTER — Inpatient Hospital Stay (HOSPITAL_COMMUNITY): Payer: Medicare Other | Admitting: Anesthesiology

## 2020-10-01 DIAGNOSIS — H524 Presbyopia: Secondary | ICD-10-CM | POA: Diagnosis not present

## 2020-10-01 DIAGNOSIS — Z91013 Allergy to seafood: Secondary | ICD-10-CM | POA: Diagnosis not present

## 2020-10-01 DIAGNOSIS — I6521 Occlusion and stenosis of right carotid artery: Principal | ICD-10-CM | POA: Diagnosis present

## 2020-10-01 DIAGNOSIS — I428 Other cardiomyopathies: Secondary | ICD-10-CM | POA: Diagnosis not present

## 2020-10-01 DIAGNOSIS — Z20822 Contact with and (suspected) exposure to covid-19: Secondary | ICD-10-CM | POA: Diagnosis present

## 2020-10-01 DIAGNOSIS — Z951 Presence of aortocoronary bypass graft: Secondary | ICD-10-CM

## 2020-10-01 DIAGNOSIS — Z95810 Presence of automatic (implantable) cardiac defibrillator: Secondary | ICD-10-CM | POA: Diagnosis not present

## 2020-10-01 DIAGNOSIS — N183 Chronic kidney disease, stage 3 unspecified: Secondary | ICD-10-CM | POA: Diagnosis present

## 2020-10-01 DIAGNOSIS — Z7902 Long term (current) use of antithrombotics/antiplatelets: Secondary | ICD-10-CM | POA: Diagnosis not present

## 2020-10-01 DIAGNOSIS — Z955 Presence of coronary angioplasty implant and graft: Secondary | ICD-10-CM

## 2020-10-01 DIAGNOSIS — I5032 Chronic diastolic (congestive) heart failure: Secondary | ICD-10-CM | POA: Diagnosis present

## 2020-10-01 DIAGNOSIS — Z803 Family history of malignant neoplasm of breast: Secondary | ICD-10-CM | POA: Diagnosis not present

## 2020-10-01 DIAGNOSIS — K760 Fatty (change of) liver, not elsewhere classified: Secondary | ICD-10-CM | POA: Diagnosis not present

## 2020-10-01 DIAGNOSIS — Z6841 Body Mass Index (BMI) 40.0 and over, adult: Secondary | ICD-10-CM

## 2020-10-01 DIAGNOSIS — Z7952 Long term (current) use of systemic steroids: Secondary | ICD-10-CM

## 2020-10-01 DIAGNOSIS — M109 Gout, unspecified: Secondary | ICD-10-CM | POA: Diagnosis present

## 2020-10-01 DIAGNOSIS — Z79899 Other long term (current) drug therapy: Secondary | ICD-10-CM

## 2020-10-01 DIAGNOSIS — K219 Gastro-esophageal reflux disease without esophagitis: Secondary | ICD-10-CM | POA: Diagnosis present

## 2020-10-01 DIAGNOSIS — T368X5A Adverse effect of other systemic antibiotics, initial encounter: Secondary | ICD-10-CM | POA: Diagnosis not present

## 2020-10-01 DIAGNOSIS — I6529 Occlusion and stenosis of unspecified carotid artery: Secondary | ICD-10-CM | POA: Diagnosis present

## 2020-10-01 DIAGNOSIS — Z9981 Dependence on supplemental oxygen: Secondary | ICD-10-CM

## 2020-10-01 DIAGNOSIS — Z7984 Long term (current) use of oral hypoglycemic drugs: Secondary | ICD-10-CM

## 2020-10-01 DIAGNOSIS — E1122 Type 2 diabetes mellitus with diabetic chronic kidney disease: Secondary | ICD-10-CM | POA: Diagnosis present

## 2020-10-01 DIAGNOSIS — Z9119 Patient's noncompliance with other medical treatment and regimen: Secondary | ICD-10-CM

## 2020-10-01 DIAGNOSIS — Z91041 Radiographic dye allergy status: Secondary | ICD-10-CM | POA: Diagnosis not present

## 2020-10-01 DIAGNOSIS — J449 Chronic obstructive pulmonary disease, unspecified: Secondary | ICD-10-CM | POA: Diagnosis present

## 2020-10-01 DIAGNOSIS — R29810 Facial weakness: Secondary | ICD-10-CM | POA: Diagnosis present

## 2020-10-01 DIAGNOSIS — Z7982 Long term (current) use of aspirin: Secondary | ICD-10-CM

## 2020-10-01 DIAGNOSIS — E781 Pure hyperglyceridemia: Secondary | ICD-10-CM | POA: Diagnosis present

## 2020-10-01 DIAGNOSIS — I4891 Unspecified atrial fibrillation: Secondary | ICD-10-CM | POA: Diagnosis not present

## 2020-10-01 DIAGNOSIS — Z8673 Personal history of transient ischemic attack (TIA), and cerebral infarction without residual deficits: Secondary | ICD-10-CM | POA: Diagnosis not present

## 2020-10-01 DIAGNOSIS — I442 Atrioventricular block, complete: Secondary | ICD-10-CM | POA: Diagnosis not present

## 2020-10-01 DIAGNOSIS — L298 Other pruritus: Secondary | ICD-10-CM | POA: Diagnosis not present

## 2020-10-01 DIAGNOSIS — Z8249 Family history of ischemic heart disease and other diseases of the circulatory system: Secondary | ICD-10-CM

## 2020-10-01 DIAGNOSIS — Z833 Family history of diabetes mellitus: Secondary | ICD-10-CM

## 2020-10-01 DIAGNOSIS — I130 Hypertensive heart and chronic kidney disease with heart failure and stage 1 through stage 4 chronic kidney disease, or unspecified chronic kidney disease: Secondary | ICD-10-CM | POA: Diagnosis not present

## 2020-10-01 DIAGNOSIS — I2510 Atherosclerotic heart disease of native coronary artery without angina pectoris: Secondary | ICD-10-CM | POA: Diagnosis present

## 2020-10-01 DIAGNOSIS — Z7951 Long term (current) use of inhaled steroids: Secondary | ICD-10-CM

## 2020-10-01 DIAGNOSIS — Z87891 Personal history of nicotine dependence: Secondary | ICD-10-CM

## 2020-10-01 DIAGNOSIS — R1310 Dysphagia, unspecified: Secondary | ICD-10-CM | POA: Diagnosis present

## 2020-10-01 DIAGNOSIS — E7800 Pure hypercholesterolemia, unspecified: Secondary | ICD-10-CM | POA: Diagnosis not present

## 2020-10-01 DIAGNOSIS — G4733 Obstructive sleep apnea (adult) (pediatric): Secondary | ICD-10-CM | POA: Diagnosis present

## 2020-10-01 DIAGNOSIS — I63231 Cerebral infarction due to unspecified occlusion or stenosis of right carotid arteries: Secondary | ICD-10-CM | POA: Diagnosis not present

## 2020-10-01 HISTORY — PX: ULTRASOUND GUIDANCE FOR VASCULAR ACCESS: SHX6516

## 2020-10-01 HISTORY — PX: TRANSCAROTID ARTERY REVASCULARIZATIONÂ: SHX6778

## 2020-10-01 LAB — CBC
HCT: 46 % (ref 36.0–46.0)
Hemoglobin: 13.8 g/dL (ref 12.0–15.0)
MCH: 29.6 pg (ref 26.0–34.0)
MCHC: 30 g/dL (ref 30.0–36.0)
MCV: 98.5 fL (ref 80.0–100.0)
Platelets: 150 10*3/uL (ref 150–400)
RBC: 4.67 MIL/uL (ref 3.87–5.11)
RDW: 14.5 % (ref 11.5–15.5)
WBC: 11.2 10*3/uL — ABNORMAL HIGH (ref 4.0–10.5)
nRBC: 0 % (ref 0.0–0.2)

## 2020-10-01 LAB — GLUCOSE, CAPILLARY
Glucose-Capillary: 177 mg/dL — ABNORMAL HIGH (ref 70–99)
Glucose-Capillary: 130 mg/dL — ABNORMAL HIGH (ref 70–99)
Glucose-Capillary: 179 mg/dL — ABNORMAL HIGH (ref 70–99)
Glucose-Capillary: 204 mg/dL — ABNORMAL HIGH (ref 70–99)

## 2020-10-01 LAB — POCT ACTIVATED CLOTTING TIME: Activated Clotting Time: 323 seconds

## 2020-10-01 LAB — CREATININE, SERUM
Creatinine, Ser: 1.38 mg/dL — ABNORMAL HIGH (ref 0.44–1.00)
GFR, Estimated: 40 mL/min — ABNORMAL LOW (ref >60)

## 2020-10-01 SURGERY — TRANSCAROTID ARTERY REVASCULARIZATION (TCAR)
Anesthesia: General | Site: Neck | Laterality: Right

## 2020-10-01 MED ORDER — VANCOMYCIN HCL IN DEXTROSE 1-5 GM/200ML-% IV SOLN
1000.0000 mg | Freq: Two times a day (BID) | INTRAVENOUS | Status: AC
  Administered 2020-10-01: 1000 mg via INTRAVENOUS
  Filled 2020-10-01 (×2): qty 200

## 2020-10-01 MED ORDER — HEPARIN SODIUM (PORCINE) 1000 UNIT/ML IJ SOLN
INTRAMUSCULAR | Status: AC
  Filled 2020-10-01: qty 2

## 2020-10-01 MED ORDER — SUGAMMADEX SODIUM 200 MG/2ML IV SOLN
INTRAVENOUS | Status: DC | PRN
  Administered 2020-10-01: 400 mg via INTRAVENOUS

## 2020-10-01 MED ORDER — MOMETASONE FURO-FORMOTEROL FUM 200-5 MCG/ACT IN AERO
2.0000 | INHALATION_SPRAY | Freq: Two times a day (BID) | RESPIRATORY_TRACT | Status: DC
  Administered 2020-10-01 – 2020-10-03 (×4): 2 via RESPIRATORY_TRACT
  Filled 2020-10-01: qty 8.8

## 2020-10-01 MED ORDER — PANTOPRAZOLE SODIUM 40 MG PO TBEC
40.0000 mg | DELAYED_RELEASE_TABLET | Freq: Two times a day (BID) | ORAL | Status: DC
  Administered 2020-10-01 – 2020-10-03 (×4): 40 mg via ORAL
  Filled 2020-10-01 (×4): qty 1

## 2020-10-01 MED ORDER — FLUTICASONE PROPIONATE 50 MCG/ACT NA SUSP
1.0000 | Freq: Every day | NASAL | Status: DC
  Administered 2020-10-02 – 2020-10-03 (×2): 1 via NASAL
  Filled 2020-10-01: qty 16

## 2020-10-01 MED ORDER — VASOPRESSIN 20 UNIT/ML IV SOLN
INTRAVENOUS | Status: DC | PRN
  Administered 2020-10-01 (×4): 1 [IU] via INTRAVENOUS

## 2020-10-01 MED ORDER — HYDRALAZINE HCL 20 MG/ML IJ SOLN
5.0000 mg | INTRAMUSCULAR | Status: DC | PRN
Start: 2020-10-01 — End: 2020-10-03

## 2020-10-01 MED ORDER — ISOSORBIDE MONONITRATE ER 60 MG PO TB24
90.0000 mg | ORAL_TABLET | Freq: Every day | ORAL | Status: DC
  Administered 2020-10-02 – 2020-10-03 (×2): 90 mg via ORAL
  Filled 2020-10-01 (×2): qty 1

## 2020-10-01 MED ORDER — ONDANSETRON HCL 4 MG/2ML IJ SOLN: 4.0000 mg | Freq: Once | INTRAMUSCULAR | Status: DC | PRN

## 2020-10-01 MED ORDER — SODIUM CHLORIDE 0.9 % IV SOLN: INTRAVENOUS | Status: DC

## 2020-10-01 MED ORDER — CLOPIDOGREL BISULFATE 75 MG PO TABS
75.0000 mg | ORAL_TABLET | Freq: Every day | ORAL | Status: DC
  Administered 2020-10-02 – 2020-10-03 (×2): 75 mg via ORAL
  Filled 2020-10-01 (×2): qty 1

## 2020-10-01 MED ORDER — CHLORHEXIDINE GLUCONATE CLOTH 2 % EX PADS: 6.0000 | MEDICATED_PAD | Freq: Once | CUTANEOUS | Status: DC

## 2020-10-01 MED ORDER — VANCOMYCIN HCL 1500 MG/300ML IV SOLN
1500.0000 mg | INTRAVENOUS | Status: AC
  Administered 2020-10-01: 1500 mg via INTRAVENOUS
  Filled 2020-10-01: qty 300

## 2020-10-01 MED ORDER — ALBUTEROL SULFATE (2.5 MG/3ML) 0.083% IN NEBU
2.5000 mg | INHALATION_SOLUTION | Freq: Four times a day (QID) | RESPIRATORY_TRACT | Status: DC | PRN
  Administered 2020-10-01: 2.5 mg via RESPIRATORY_TRACT

## 2020-10-01 MED ORDER — ONDANSETRON HCL 4 MG/2ML IJ SOLN
INTRAMUSCULAR | Status: DC | PRN
  Administered 2020-10-01: 4 mg via INTRAVENOUS

## 2020-10-01 MED ORDER — IPRATROPIUM BROMIDE 0.02 % IN SOLN
0.5000 mg | Freq: Four times a day (QID) | RESPIRATORY_TRACT | Status: DC
  Administered 2020-10-01: 0.5 mg via RESPIRATORY_TRACT
  Filled 2020-10-01: qty 2.5

## 2020-10-01 MED ORDER — IODIXANOL 320 MG/ML IV SOLN
INTRAVENOUS | Status: DC | PRN
  Administered 2020-10-01: 30 mL

## 2020-10-01 MED ORDER — TORSEMIDE 20 MG PO TABS
20.0000 mg | ORAL_TABLET | Freq: Every day | ORAL | Status: DC
  Administered 2020-10-02 – 2020-10-03 (×2): 20 mg via ORAL
  Filled 2020-10-01 (×2): qty 1

## 2020-10-01 MED ORDER — HEPARIN 6000 UNIT IRRIGATION SOLUTION
Status: AC
  Filled 2020-10-01: qty 500

## 2020-10-01 MED ORDER — CYCLOSPORINE 0.05 % OP EMUL
1.0000 [drp] | Freq: Two times a day (BID) | OPHTHALMIC | Status: DC
  Administered 2020-10-02 – 2020-10-03 (×3): 1 [drp] via OPHTHALMIC
  Filled 2020-10-01 (×6): qty 1

## 2020-10-01 MED ORDER — HYDROMORPHONE HCL 1 MG/ML IJ SOLN
0.5000 mg | INTRAMUSCULAR | Status: DC | PRN
Start: 2020-10-01 — End: 2020-10-03

## 2020-10-01 MED ORDER — DIPHENHYDRAMINE HCL 50 MG/ML IJ SOLN
25.0000 mg | Freq: Once | INTRAMUSCULAR | Status: AC
  Administered 2020-10-01: 25 mg via INTRAVENOUS

## 2020-10-01 MED ORDER — LIDOCAINE 2% (20 MG/ML) 5 ML SYRINGE
INTRAMUSCULAR | Status: DC | PRN
Start: 2020-10-01 — End: 2020-10-01
  Administered 2020-10-01: 60 mg via INTRAVENOUS

## 2020-10-01 MED ORDER — 0.9 % SODIUM CHLORIDE (POUR BTL) OPTIME
TOPICAL | Status: DC | PRN
  Administered 2020-10-01: 1000 mL

## 2020-10-01 MED ORDER — ALBUTEROL SULFATE (2.5 MG/3ML) 0.083% IN NEBU
INHALATION_SOLUTION | RESPIRATORY_TRACT | Status: AC
  Administered 2020-10-01: 2.5 mg via RESPIRATORY_TRACT
  Filled 2020-10-01: qty 3

## 2020-10-01 MED ORDER — LIDOCAINE HCL (PF) 1 % IJ SOLN
INTRAMUSCULAR | Status: AC
  Filled 2020-10-01: qty 30

## 2020-10-01 MED ORDER — HEPARIN 6000 UNIT IRRIGATION SOLUTION
Status: DC | PRN
  Administered 2020-10-01: 1

## 2020-10-01 MED ORDER — CARBOXYMETHYLCELLULOSE SODIUM 0.5 % OP SOLN: 1.0000 [drp] | Freq: Three times a day (TID) | OPHTHALMIC | Status: DC

## 2020-10-01 MED ORDER — OXYCODONE-ACETAMINOPHEN 5-325 MG PO TABS
1.0000 | ORAL_TABLET | ORAL | Status: DC | PRN
  Administered 2020-10-01: 2 via ORAL
  Administered 2020-10-02 – 2020-10-03 (×2): 1 via ORAL
  Filled 2020-10-01: qty 1
  Filled 2020-10-01: qty 2
  Filled 2020-10-01: qty 1

## 2020-10-01 MED ORDER — FENTANYL CITRATE (PF) 250 MCG/5ML IJ SOLN
INTRAMUSCULAR | Status: AC
  Filled 2020-10-01: qty 5

## 2020-10-01 MED ORDER — SENNOSIDES-DOCUSATE SODIUM 8.6-50 MG PO TABS: 1.0000 | ORAL_TABLET | Freq: Every evening | ORAL | Status: DC | PRN

## 2020-10-01 MED ORDER — LACTATED RINGERS IV SOLN: INTRAVENOUS | Status: DC | PRN

## 2020-10-01 MED ORDER — ALLOPURINOL 100 MG PO TABS
100.0000 mg | ORAL_TABLET | Freq: Every day | ORAL | Status: DC
  Administered 2020-10-02 – 2020-10-03 (×2): 100 mg via ORAL
  Filled 2020-10-01 (×2): qty 1

## 2020-10-01 MED ORDER — FENTANYL CITRATE (PF) 100 MCG/2ML IJ SOLN: 25.0000 ug | INTRAMUSCULAR | Status: DC | PRN

## 2020-10-01 MED ORDER — SODIUM CHLORIDE 0.9 % IV SOLN: 500.0000 mL | Freq: Once | INTRAVENOUS | Status: DC | PRN

## 2020-10-01 MED ORDER — ALBUTEROL SULFATE HFA 108 (90 BASE) MCG/ACT IN AERS: 2.0000 | INHALATION_SPRAY | Freq: Four times a day (QID) | RESPIRATORY_TRACT | Status: DC | PRN

## 2020-10-01 MED ORDER — LACTATED RINGERS IV SOLN: INTRAVENOUS | Status: DC

## 2020-10-01 MED ORDER — POLYETHYLENE GLYCOL 3350 17 G PO PACK
17.0000 g | PACK | Freq: Every day | ORAL | Status: DC
  Administered 2020-10-02 – 2020-10-03 (×2): 17 g via ORAL
  Filled 2020-10-01 (×2): qty 1

## 2020-10-01 MED ORDER — POTASSIUM CHLORIDE CRYS ER 20 MEQ PO TBCR
20.0000 meq | EXTENDED_RELEASE_TABLET | Freq: Every morning | ORAL | Status: DC
  Administered 2020-10-02 – 2020-10-03 (×2): 20 meq via ORAL
  Filled 2020-10-01 (×2): qty 1

## 2020-10-01 MED ORDER — ORAL CARE MOUTH RINSE: 15.0000 mL | Freq: Once | OROMUCOSAL | Status: AC

## 2020-10-01 MED ORDER — ASPIRIN EC 81 MG PO TBEC
81.0000 mg | DELAYED_RELEASE_TABLET | Freq: Every day | ORAL | Status: DC
  Administered 2020-10-02 – 2020-10-03 (×2): 81 mg via ORAL
  Filled 2020-10-01 (×2): qty 1

## 2020-10-01 MED ORDER — PROPOFOL 10 MG/ML IV BOLUS
INTRAVENOUS | Status: AC
  Filled 2020-10-01: qty 20

## 2020-10-01 MED ORDER — FERROUS SULFATE 325 (65 FE) MG PO TABS
325.0000 mg | ORAL_TABLET | Freq: Every day | ORAL | Status: DC
  Administered 2020-10-02 – 2020-10-03 (×2): 325 mg via ORAL
  Filled 2020-10-01 (×2): qty 1

## 2020-10-01 MED ORDER — MAGNESIUM SULFATE 2 GM/50ML IV SOLN: 2.0000 g | Freq: Every day | INTRAVENOUS | Status: DC | PRN

## 2020-10-01 MED ORDER — ROSUVASTATIN CALCIUM 20 MG PO TABS
40.0000 mg | ORAL_TABLET | Freq: Every evening | ORAL | Status: DC
  Administered 2020-10-02: 40 mg via ORAL
  Filled 2020-10-01: qty 2

## 2020-10-01 MED ORDER — IPRATROPIUM-ALBUTEROL 0.5-2.5 (3) MG/3ML IN SOLN
3.0000 mL | Freq: Four times a day (QID) | RESPIRATORY_TRACT | Status: DC | PRN
  Administered 2020-10-02: 3 mL via RESPIRATORY_TRACT
  Filled 2020-10-01: qty 3

## 2020-10-01 MED ORDER — PROPOFOL 10 MG/ML IV BOLUS
INTRAVENOUS | Status: DC | PRN
  Administered 2020-10-01: 100 mg via INTRAVENOUS

## 2020-10-01 MED ORDER — HEMOSTATIC AGENTS (NO CHARGE) OPTIME
TOPICAL | Status: DC | PRN
  Administered 2020-10-01: 1 via TOPICAL

## 2020-10-01 MED ORDER — CHLORHEXIDINE GLUCONATE 0.12 % MT SOLN
15.0000 mL | Freq: Once | OROMUCOSAL | Status: AC
  Administered 2020-10-01: 15 mL via OROMUCOSAL
  Filled 2020-10-01: qty 15

## 2020-10-01 MED ORDER — PHENYLEPHRINE HCL (PRESSORS) 10 MG/ML IV SOLN
INTRAVENOUS | Status: DC | PRN
  Administered 2020-10-01 (×3): 80 ug via INTRAVENOUS

## 2020-10-01 MED ORDER — ALUM & MAG HYDROXIDE-SIMETH 200-200-20 MG/5ML PO SUSP: 15.0000 mL | ORAL | Status: DC | PRN

## 2020-10-01 MED ORDER — PREDNISONE 10 MG PO TABS: 10.0000 mg | ORAL_TABLET | Freq: Every day | ORAL | Status: DC | PRN

## 2020-10-01 MED ORDER — OXYCODONE HCL 5 MG/5ML PO SOLN: 5.0000 mg | Freq: Once | ORAL | Status: DC | PRN

## 2020-10-01 MED ORDER — ONDANSETRON HCL 4 MG/2ML IJ SOLN: 4.0000 mg | Freq: Four times a day (QID) | INTRAMUSCULAR | Status: DC | PRN

## 2020-10-01 MED ORDER — POLYVINYL ALCOHOL 1.4 % OP SOLN
1.0000 [drp] | OPHTHALMIC | Status: DC | PRN
  Filled 2020-10-01: qty 15

## 2020-10-01 MED ORDER — NYSTATIN 100000 UNIT/GM EX CREA: 1.0000 "application " | TOPICAL_CREAM | Freq: Two times a day (BID) | CUTANEOUS | Status: DC | PRN

## 2020-10-01 MED ORDER — PHENOL 1.4 % MT LIQD: 1.0000 | OROMUCOSAL | Status: DC | PRN

## 2020-10-01 MED ORDER — POTASSIUM CHLORIDE CRYS ER 20 MEQ PO TBCR: 20.0000 meq | EXTENDED_RELEASE_TABLET | Freq: Every day | ORAL | Status: DC | PRN

## 2020-10-01 MED ORDER — SODIUM CHLORIDE 0.9 % IV SOLN
INTRAVENOUS | Status: DC | PRN
  Administered 2020-10-01: 30 ug/min via INTRAVENOUS

## 2020-10-01 MED ORDER — ROCURONIUM BROMIDE 10 MG/ML (PF) SYRINGE
PREFILLED_SYRINGE | INTRAVENOUS | Status: DC | PRN
Start: 2020-10-01 — End: 2020-10-01
  Administered 2020-10-01: 50 mg via INTRAVENOUS

## 2020-10-01 MED ORDER — METHYLPREDNISOLONE SODIUM SUCC 125 MG IJ SOLR
125.0000 mg | Freq: Once | INTRAMUSCULAR | Status: AC
  Administered 2020-10-01: 125 mg via INTRAVENOUS
  Filled 2020-10-01: qty 2

## 2020-10-01 MED ORDER — VASOPRESSIN 20 UNIT/ML IV SOLN
INTRAVENOUS | Status: AC
  Filled 2020-10-01: qty 1

## 2020-10-01 MED ORDER — ALBUTEROL SULFATE (2.5 MG/3ML) 0.083% IN NEBU
2.5000 mg | INHALATION_SOLUTION | Freq: Once | RESPIRATORY_TRACT | Status: AC
  Filled 2020-10-01: qty 3

## 2020-10-01 MED ORDER — EMPAGLIFLOZIN 10 MG PO TABS
10.0000 mg | ORAL_TABLET | Freq: Every day | ORAL | Status: DC
  Administered 2020-10-02 – 2020-10-03 (×2): 10 mg via ORAL
  Filled 2020-10-01 (×2): qty 1

## 2020-10-01 MED ORDER — OXYCODONE HCL 5 MG PO TABS: 5.0000 mg | ORAL_TABLET | Freq: Once | ORAL | Status: DC | PRN

## 2020-10-01 MED ORDER — HEPARIN SODIUM (PORCINE) 1000 UNIT/ML IJ SOLN
INTRAMUSCULAR | Status: DC | PRN
  Administered 2020-10-01: 11000 [IU] via INTRAVENOUS

## 2020-10-01 MED ORDER — METOPROLOL TARTRATE 5 MG/5ML IV SOLN: 2.0000 mg | INTRAVENOUS | Status: DC | PRN

## 2020-10-01 MED ORDER — INSULIN ASPART 100 UNIT/ML IJ SOLN
0.0000 [IU] | Freq: Three times a day (TID) | INTRAMUSCULAR | Status: DC
  Administered 2020-10-02 – 2020-10-03 (×4): 2 [IU] via SUBCUTANEOUS

## 2020-10-01 MED ORDER — FAMOTIDINE IN NACL 20-0.9 MG/50ML-% IV SOLN
20.0000 mg | Freq: Once | INTRAVENOUS | Status: AC
  Administered 2020-10-01: 20 mg via INTRAVENOUS
  Filled 2020-10-01: qty 50

## 2020-10-01 MED ORDER — PROTAMINE SULFATE 10 MG/ML IV SOLN
INTRAVENOUS | Status: DC | PRN
  Administered 2020-10-01: 25 mg via INTRAVENOUS
  Administered 2020-10-01: 10 mg via INTRAVENOUS
  Administered 2020-10-01: 5 mg via INTRAVENOUS
  Administered 2020-10-01: 10 mg via INTRAVENOUS

## 2020-10-01 MED ORDER — ACETAMINOPHEN 500 MG PO TABS: 1000.0000 mg | ORAL_TABLET | Freq: Two times a day (BID) | ORAL | Status: DC | PRN

## 2020-10-01 MED ORDER — LABETALOL HCL 5 MG/ML IV SOLN: 10.0000 mg | INTRAVENOUS | Status: DC | PRN

## 2020-10-01 MED ORDER — RANOLAZINE ER 500 MG PO TB12
1000.0000 mg | ORAL_TABLET | Freq: Two times a day (BID) | ORAL | Status: DC
  Administered 2020-10-01 – 2020-10-03 (×4): 1000 mg via ORAL
  Filled 2020-10-01 (×4): qty 2

## 2020-10-01 MED ORDER — BISACODYL 5 MG PO TBEC: 5.0000 mg | DELAYED_RELEASE_TABLET | Freq: Every day | ORAL | Status: DC | PRN

## 2020-10-01 MED ORDER — FENTANYL CITRATE (PF) 100 MCG/2ML IJ SOLN
INTRAMUSCULAR | Status: DC | PRN
  Administered 2020-10-01: 50 ug via INTRAVENOUS

## 2020-10-01 MED ORDER — LIDOCAINE HCL 1 % IJ SOLN
INTRAMUSCULAR | Status: DC | PRN
  Administered 2020-10-01: 10 mL

## 2020-10-01 MED ORDER — GUAIFENESIN-DM 100-10 MG/5ML PO SYRP
15.0000 mL | ORAL_SOLUTION | ORAL | Status: DC | PRN
  Administered 2020-10-02: 15 mL via ORAL
  Filled 2020-10-01: qty 15

## 2020-10-01 MED ORDER — DOCUSATE SODIUM 100 MG PO CAPS
100.0000 mg | ORAL_CAPSULE | Freq: Every day | ORAL | Status: DC
  Administered 2020-10-02 – 2020-10-03 (×2): 100 mg via ORAL
  Filled 2020-10-01 (×2): qty 1

## 2020-10-01 MED ORDER — LORATADINE 10 MG PO TABS
10.0000 mg | ORAL_TABLET | Freq: Every day | ORAL | Status: DC
  Administered 2020-10-02 – 2020-10-03 (×2): 10 mg via ORAL
  Filled 2020-10-01 (×2): qty 1

## 2020-10-01 MED ORDER — HEPARIN SODIUM (PORCINE) 5000 UNIT/ML IJ SOLN
5000.0000 [IU] | Freq: Three times a day (TID) | INTRAMUSCULAR | Status: DC
  Administered 2020-10-01 – 2020-10-03 (×6): 5000 [IU] via SUBCUTANEOUS
  Filled 2020-10-01 (×6): qty 1

## 2020-10-01 MED ORDER — IPRATROPIUM-ALBUTEROL 0.5-2.5 (3) MG/3ML IN SOLN
3.0000 mL | Freq: Four times a day (QID) | RESPIRATORY_TRACT | Status: DC
  Administered 2020-10-02: 3 mL via RESPIRATORY_TRACT
  Filled 2020-10-01: qty 3

## 2020-10-01 SURGICAL SUPPLY — 68 items
BAG BANDED W/RUBBER/TAPE 36X54 (MISCELLANEOUS) ×3 IMPLANT
BAG COUNTER SPONGE SURGICOUNT (BAG) ×3 IMPLANT
BALLN STERLING RX 5X30X80 (BALLOONS) ×6
BALLN STERLING RX 6X20X80 (BALLOONS) ×3
BALLOON STERLING RX 5X30X80 (BALLOONS) ×4 IMPLANT
BALLOON STERLING RX 6X20X80 (BALLOONS) ×2 IMPLANT
CANISTER SUCT 3000ML PPV (MISCELLANEOUS) ×3 IMPLANT
CATH ROBINSON RED A/P 18FR (CATHETERS) IMPLANT
CLIP VESOCCLUDE MED 6/CT (CLIP) ×3 IMPLANT
CLIP VESOCCLUDE SM WIDE 6/CT (CLIP) ×3 IMPLANT
COVER DOME SNAP 22 D (MISCELLANEOUS) ×3 IMPLANT
COVER PROBE W GEL 5X96 (DRAPES) ×3 IMPLANT
DERMABOND ADVANCED (GAUZE/BANDAGES/DRESSINGS) ×2
DERMABOND ADVANCED .7 DNX12 (GAUZE/BANDAGES/DRESSINGS) ×4 IMPLANT
DRAPE FEMORAL ANGIO 80X135IN (DRAPES) ×3 IMPLANT
ELECT REM PT RETURN 9FT ADLT (ELECTROSURGICAL) ×3
ELECTRODE REM PT RTRN 9FT ADLT (ELECTROSURGICAL) ×2 IMPLANT
GAUZE 4X4 16PLY ~~LOC~~+RFID DBL (SPONGE) ×3 IMPLANT
GAUZE KITTNER 4X5 RF (MISCELLANEOUS) ×3 IMPLANT
GAUZE SPONGE 4X4 12PLY STRL (GAUZE/BANDAGES/DRESSINGS) ×3 IMPLANT
GLOVE SRG 8 PF TXTR STRL LF DI (GLOVE) ×2 IMPLANT
GLOVE SURG ENC MOIS LTX SZ7.5 (GLOVE) ×3 IMPLANT
GLOVE SURG MICRO LTX SZ7.5 (GLOVE) ×3 IMPLANT
GLOVE SURG UNDER POLY LF SZ8 (GLOVE) ×3
GOWN STRL REUS W/ TWL LRG LVL3 (GOWN DISPOSABLE) ×4 IMPLANT
GOWN STRL REUS W/ TWL XL LVL3 (GOWN DISPOSABLE) ×2 IMPLANT
GOWN STRL REUS W/TWL LRG LVL3 (GOWN DISPOSABLE) ×6
GOWN STRL REUS W/TWL XL LVL3 (GOWN DISPOSABLE) ×3
GUIDEWIRE ENROUTE 0.014 (WIRE) ×3 IMPLANT
HEMOSTAT SNOW SURGICEL 2X4 (HEMOSTASIS) ×3 IMPLANT
INTRODUCER KIT GALT 7CM (INTRODUCER) ×3
KIT BASIN OR (CUSTOM PROCEDURE TRAY) ×3 IMPLANT
KIT ENCORE 26 ADVANTAGE (KITS) ×3 IMPLANT
KIT INTRODUCER GALT 7 (INTRODUCER) ×2 IMPLANT
KIT MICROPUNCTURE NIT STIFF (SHEATH) ×3 IMPLANT
KIT TURNOVER KIT B (KITS) ×3 IMPLANT
NEEDLE HYPO 25GX1X1/2 BEV (NEEDLE) ×3 IMPLANT
PACK CAROTID (CUSTOM PROCEDURE TRAY) ×3 IMPLANT
POSITIONER HEAD DONUT 9IN (MISCELLANEOUS) ×3 IMPLANT
PROTECTION STATION PRESSURIZED (MISCELLANEOUS) ×3
SET MICROPUNCTURE 5F STIFF (MISCELLANEOUS) ×3 IMPLANT
SHEATH PINNACLE 5F 10CM (SHEATH) ×3 IMPLANT
SHEATH PINNACLE 5FR (SHEATH) IMPLANT
SHUNT CAROTID BYPASS 10 (VASCULAR PRODUCTS) IMPLANT
SHUNT CAROTID BYPASS 12FRX15.5 (VASCULAR PRODUCTS) IMPLANT
SPONGE T-LAP 18X18 ~~LOC~~+RFID (SPONGE) ×3 IMPLANT
STATION PROTECTION PRESSURIZED (MISCELLANEOUS) ×2 IMPLANT
STENT TRANSCAROTID SYSTEM 9X30 (Permanent Stent) ×3 IMPLANT
SUT MNCRL AB 4-0 PS2 18 (SUTURE) ×3 IMPLANT
SUT PROLENE 5 0 C 1 24 (SUTURE) ×3 IMPLANT
SUT PROLENE 6 0 BV (SUTURE) IMPLANT
SUT PROLENE 7 0 BV 1 (SUTURE) IMPLANT
SUT SILK 2 0 PERMA HAND 18 BK (SUTURE) IMPLANT
SUT SILK 2 0 SH (SUTURE) ×3 IMPLANT
SUT SILK 2 0 SH CR/8 (SUTURE) ×3 IMPLANT
SUT SILK 3 0 (SUTURE)
SUT SILK 3-0 18XBRD TIE 12 (SUTURE) IMPLANT
SUT VIC AB 3-0 SH 27 (SUTURE) ×3
SUT VIC AB 3-0 SH 27X BRD (SUTURE) ×2 IMPLANT
SYR 10ML LL (SYRINGE) ×9 IMPLANT
SYR 20ML LL LF (SYRINGE) ×3 IMPLANT
SYR CONTROL 10ML LL (SYRINGE) ×3 IMPLANT
SYSTEM TRANSCAROTID NEUROPRTCT (MISCELLANEOUS) ×2 IMPLANT
TOWEL GREEN STERILE (TOWEL DISPOSABLE) ×3 IMPLANT
TRANSCAROTID NEUROPROTECT SYS (MISCELLANEOUS) ×3
WATER STERILE IRR 1000ML POUR (IV SOLUTION) ×3 IMPLANT
WIRE BENTSON .035X145CM (WIRE) ×3 IMPLANT
WIRE STARTER BENTSON 035X150 (WIRE) ×3 IMPLANT

## 2020-10-01 NOTE — Transfer of Care (Signed)
Immediate Anesthesia Transfer of Care Note  Patient: [name]  Procedure(s) Performed: RIGHT TRANSCAROTID ARTERY REVASCULARIZATION (Right: Neck) ULTRASOUND GUIDANCE FOR VASCULAR ACCESS (Left: Groin)  Patient Location: PACU  Anesthesia Type:General  Level of Consciousness: awake, alert , oriented and patient cooperative  Airway & Oxygen Therapy: Patient Spontanous Breathing and Patient connected to face mask oxygen  Post-op Assessment: Report given to RN and Post -op Vital signs reviewed and stable  Post vital signs: Reviewed and stable  Last Vitals:  Vitals Value Taken Time  BP 165/86 10/01/20 1211  Temp    Pulse 105 10/01/20 1214  Resp 21 10/01/20 1214  SpO2 98 % 10/01/20 1214  Vitals shown include unvalidated device data.  Last Pain:  Vitals:   10/01/20 0853  TempSrc:   PainSc: 0-No pain         Complications: No notable events documented.

## 2020-10-01 NOTE — H&P (Signed)
History and Physical Interval Note:  10/01/2020 9:28 AM  [name]  has presented today for surgery, with the diagnosis of Acute left sided weakness.  The various methods of treatment have been discussed with the patient and family. After consideration of risks, benefits and other options for treatment, the patient has consented to  Procedure(s): RIGHT TRANSCAROTID ARTERY REVASCULARIZATION (Right) as a surgical intervention.  The patient's history has been reviewed, patient examined, no change in status, stable for surgery.  I have reviewed the patient's chart and labs.  Questions were answered to the patient's satisfaction.    Right TCAR for high grade symptomatic stenosis.  [name]  Reason for Consult: Right CVA versus TIA, critical right ICA stenosis Requesting Physician: Dr. Mal Misty MRN #:  [id number]   History of Present Illness: This is a 75 y.o. female with past medical history significant for cardiomyopathy status post AICD, CAD with history of CABG and stenting, type 2 diabetes mellitus, hypertension, hyper lipidemia.  She is being evaluated for CVA versus TIA causing left sided facial weakness.  Symptom onset occurred yesterday morning.  She believes her left face is back to baseline.  She denies any further one-sided weakness, slurring speech, or drastic vision changes.  She denies any history of claudication, rest pain, or nonhealing wounds of bilateral lower extremities.  Work-up included carotid duplex demonstrating 80 to 99% stenosis of right ICA.  Patient states she has a anaphylactic reaction to contrast and would prefer not to receive any contrast.  She has never been premedicated to receive contrast after learning of allergy about 5 years ago.  His cardiologist is Dr. Sallyanne [name].       Past Medical History:  Diagnosis Date   ABDOMINAL PAIN -GENERALIZED 02/21/2010    Qualifier: Diagnosis of  By: Chester Holstein NP, Nevin Bloodgood     AICD (automatic cardioverter/defibrillator)  present     Anemia     Arthritis     Asthma     Atrial fibrillation ([place]) 10/24/2015    Questionable history of A Fib/A Flutter. On device interrogation on 9/7, showed 0.56min of A Fib/A Flutter with 1% burden.  Device check in Jan 2018 showed no sustained AF (runs of less than 30 seconds). No anticoagulation indicated.   CAD (coronary artery disease)      a. 10/09/16 LHC: SVG->LAD patent, SVG->Diag patent, SVG->RCA patent, SVG->LCx occluded. EF 60%, b. 10/31/16 LHC DES to AV groove Circ, DES to intermed branch   Cellulitis and abscess of foot 12/2014    RT FOOT   CHF (congestive heart failure) (HCC)     Chronic bronchitis (HCC)     Chronic lower back pain     Complete heart block ([place]) 06/22/2015   Diverticulosis     Facial numbness 02/2016   Fatty liver disease, nonalcoholic     Gastritis     GERD (gastroesophageal reflux disease)     Gout     H/O hiatal hernia     HEMORRHOIDS-EXTERNAL 02/21/2010    Qualifier: Diagnosis of  By: Chester Holstein NP, Paula     High cholesterol     Hyperplastic colon polyp     Hypertension     IBS (irritable bowel syndrome)     Internal hemorrhoids     INTERNAL HEMORRHOIDS WITHOUT MENTION COMP 04/12/2007    Qualifier: Diagnosis of  By: Olevia Perches MD, Lowella Bandy    Ischemic cardiomyopathy     Nonischemic cardiomyopathy ([place]) 11/22/2011    Pt responded to BiV ICD- last  EF 55-60% Sept 2017   Obesity     On home oxygen therapy      "2L at night" (10/31/2016)   OSA (obstructive sleep apnea)      "can't tolerate a mask" (10/30/2016)   PERSONAL HX COLONIC POLYPS 02/21/2010    Qualifier: Diagnosis of  By: Chester Holstein NP, Nevin Bloodgood     Pneumonia      "couple times" (10/31/2016)   Right facial numbness 03/05/2016   Shortness of breath     TIA (transient ischemic attack)      "recently" (10/31/2016)   Type II diabetes mellitus ([place])             Past Surgical History:  Procedure Laterality Date   ABDOMINAL ULTRASOUND   12/01/2011    Peripelvic cysts- #1- 2.4x1.9x2.3cm,  #2-1.2x0.9x1.2cm   ANKLE FRACTURE SURGERY Right      "had rod put in"   ANTERIOR CERVICAL DECOMP/DISCECTOMY FUSION       APPENDECTOMY       BACK SURGERY       BIOPSY   12/29/2017    Procedure: BIOPSY;  Surgeon: [name], MD;  Location: [facility];  Service: Endoscopy;;   BIV ICD INSERTION CRT-D   2001?   BIV PACEMAKER GENERATOR CHANGE OUT N/A 11/02/2012    Procedure: BIV PACEMAKER GENERATOR CHANGE OUT;  Surgeon: Sanda Klein, MD;  Location: [place] [facility]ATH [facility];  Service: Cardiovascular;  Laterality: N/A;   CARDIAC CATHETERIZATION   05/17/1999    No significant coronary obstructive disease w/ mild 20% luminal irregularity of the first diag branch of the LAD   CARDIAC CATHETERIZATION   07/08/2002    No significant CAD, moderately depressed LV systolic function   CARDIAC CATHETERIZATION Bilateral 04/26/2007    Normal findings, recommend medical therapy   CARDIAC CATHETERIZATION   02/18/2008    Moderate CAD, would benefit from having a functional study, recommend continue medical therapy   CARDIAC CATHETERIZATION   07/23/2012    Medical therapy   CARDIAC CATHETERIZATION N/A 11/24/2014    Procedure: Left Heart Cath and Coronary Angiography;  Surgeon: Troy Sine, MD;  Location: [place] CV LAB;  Service: Cardiovascular;  Laterality: N/A;   CARDIAC CATHETERIZATION   11/27/2014    Procedure: Intravascular Pressure Wire/FFR Study;  Surgeon: Peter M Martinique, MD;  Location: [place] [facility];  Service: Cardiovascular;;   CARDIAC CATHETERIZATION   10/09/2016   CHOLECYSTECTOMY N/A 04/09/2018    Procedure: LAPAROSCOPIC CHOLECYSTECTOMY;  Surgeon: Greer Pickerel, MD;  [facility]: [facility];  Service: General;  Laterality: N/A;   COLONOSCOPY WITH PROPOFOL N/A 12/29/2017    Procedure: COLONOSCOPY WITH PROPOFOL;  Surgeon: [name], MD;  [facility];  Service: Endoscopy;  Laterality: N/A;   CORONARY ANGIOGRAM   2010   CORONARY ARTERY BYPASS GRAFT N/A 11/29/2014    Procedure: CORONARY  ARTERY BYPASS GRAFTING x 5 (LIMA-LAD, SVG-D, SVG-OM1-OM2, SVG-PD);  Surgeon: Melrose Nakayama, MD;  Location: [place];  Service: Open Heart Surgery;  Laterality: N/A;   CORONARY STENT INTERVENTION N/A 10/31/2016    Procedure: CORONARY STENT INTERVENTION;  Surgeon: Burnell Blanks, MD;  Location: [place] CV LAB;  Service: Cardiovascular;  Laterality: N/A;   ESOPHAGOGASTRODUODENOSCOPY (EGD) WITH PROPOFOL N/A 12/29/2017    Procedure: ESOPHAGOGASTRODUODENOSCOPY (EGD) WITH PROPOFOL;  Surgeon: [name], MD;  [facility];  Service: Endoscopy;  Laterality: N/A;   FRACTURE SURGERY       INSERT / REPLACE / [place]  KNEE ARTHROSCOPY Bilateral     LEFT HEART CATH AND CORS/GRAFTS ANGIOGRAPHY N/A 12/09/2017    Procedure: LEFT HEART CATH AND CORS/GRAFTS ANGIOGRAPHY;  Surgeon: Troy Sine, MD;  Location: [place] [facility] LAB;  Service: Cardiovascular;  Laterality: N/A;   LEFT HEART CATHETERIZATION WITH CORONARY ANGIOGRAM N/A 07/23/2012    Procedure: LEFT HEART CATHETERIZATION WITH CORONARY ANGIOGRAM;  Surgeon: Leonie Man, MD;  Location[facility];  Service: Cardiovascular;  Laterality: N/A;   LEXISCAN MYOVIEW   11/14/2011    Mild-moderate defect seen in Mid Inferolateral and Mid Anterolateral regions-consistant w/ infarct/scar. No significant ischemia demonstrated.   POLYPECTOMY   12/29/2017    Procedure: POLYPECTOMY;  Surgeon: [name], MD;  [facility];  Service: Endoscopy;;   RIGHT/LEFT HEART CATH AND CORONARY ANGIOGRAPHY N/A 10/09/2016    Procedure: RIGHT/LEFT HEART CATH AND CORONARY ANGIOGRAPHY;  Surgeon: Jolaine Artist, MD;  Location: [place] CV LAB;  Service: Cardiovascular;  Laterality: N/A;   RIGHT/LEFT HEART CATH AND CORONARY/GRAFT ANGIOGRAPHY N/A 03/11/2019    Procedure: RIGHT/LEFT HEART CATH AND CORONARY/GRAFT ANGIOGRAPHY;  Surgeon: Jolaine Artist, MD;  Location: [place] CV LAB;  Service: Cardiovascular;   Laterality: N/A;   TEE WITHOUT CARDIOVERSION N/A 11/29/2014    Procedure: TRANSESOPHAGEAL ECHOCARDIOGRAM (TEE);  Surgeon: Melrose Nakayama, MD;  Location: [place];  Service: Open Heart Surgery;  Laterality: N/A;   TRANSTHORACIC ECHOCARDIOGRAM   07/23/2012    EF 55-60%, normal-mild   TUBAL LIGATION               Allergies  Allergen Reactions   Ivp Dye [Iodinated Diagnostic Agents] Shortness Of Breath      No reaction to PO contrast with non-ionic dye.06-25-2014/rsm   Shellfish Allergy Anaphylaxis   Sulfa Antibiotics Shortness Of Breath   Iodine Hives   Atorvastatin Other (See Comments)      Pt states "causes bilateral leg pain/cramps."   Benadryl [Diphenhydramine] Other (See Comments)      "THIS DRIVES ME CRAZY AND MAKES ME FEEL LIKE I AM DYING"   Colchicine Nausea And Vomiting   Contrast Media [Iodinated Diagnostic Agents] Hives   Doxycycline Other (See Comments)      Unknown reaction, per patient   Uloric [Febuxostat] Other (See Comments)      Unknown reaction   Cephalexin Itching and Rash   Zithromax [Azithromycin] Rash             Prior to Admission medications   Medication Sig Start Date End Date Taking? Authorizing Provider  acetaminophen (TYLENOL) 500 MG tablet Take 1,000 mg by mouth 2 (two) times daily as needed (for pain or headaches).     Yes [provider]  albuterol (PROVENTIL HFA;VENTOLIN HFA) 108 (90 Base) MCG/ACT inhaler Inhale 2 puffs into the lungs every 6 (six) hours as needed for wheezing or shortness of breath. Patient taking differently: Inhale 2 puffs into the lungs every 4 (four) hours as needed for wheezing or shortness of breath. 05/02/18   Yes Fuller Plan A, MD  albuterol (PROVENTIL) (2.5 MG/3ML) 0.083% nebulizer solution Take 3 mLs (2.5 mg total) by nebulization every 6 (six) hours as needed for wheezing or shortness of breath. 05/02/18   Yes Fuller Plan A, MD  allopurinol (ZYLOPRIM) 100 MG tablet TAKE 1 TABLET BY MOUTH  DAILY Patient  taking differently: Take 100 mg by mouth 2 (two) times daily. 04/23/20   Yes Croitoru, [name], MD  aspirin EC 81 MG tablet Take 1 tablet (81  mg total) by mouth daily. 03/27/16   Yes Kilroy, Luke K, PA-C  budesonide-formoterol (SYMBICORT) 160-4.5 MCG/ACT inhaler Inhale 2 puffs into the lungs 2 (two) times daily.     Yes [provider]  carboxymethylcellulose (REFRESH PLUS) 0.5 % SOLN Place 1 drop into both eyes 3 (three) times daily.     Yes [provider]  clopidogrel (PLAVIX) 75 MG tablet Take 1 tablet (75 mg total) by mouth daily. Patient taking differently: Take 75 mg by mouth at bedtime. 03/06/20   Yes Croitoru, [name], MD  cycloSPORINE (RESTASIS) 0.05 % ophthalmic emulsion Place 1 drop into both eyes every 12 (twelve) hours.     Yes [provider]  empagliflozin (JARDIANCE) 10 MG TABS tablet Take 10 mg by mouth daily before breakfast. 06/30/19   Yes Bensimhon, Shaune Pascal, MD  EPINEPHrine 0.3 mg/0.3 mL IJ SOAJ injection Inject 0.3 mg into the muscle once as needed (severe allergic reaction).     Yes [provider]  ferrous sulfate 325 (65 FE) MG tablet Take 325 mg by mouth daily with breakfast.     Yes [provider]  fluticasone (FLONASE) 50 MCG/ACT nasal spray Place 1 spray into both nostrils daily. 03/22/20   Yes [provider]  ipratropium (ATROVENT) 0.02 % nebulizer solution Take 2.5 mLs (0.5 mg total) by nebulization 4 (four) times daily. Patient taking differently: Take 0.5 mg by nebulization every 6 (six) hours as needed for wheezing or shortness of breath. 05/02/18   Yes Fuller Plan A, MD  isosorbide mononitrate (IMDUR) 30 MG 24 hr tablet Take 3 tablets (90 mg total) by mouth daily. 07/12/20   Yes Croitoru, [name], MD  loratadine (CLARITIN) 10 MG tablet Take 10 mg by mouth daily. 11/23/19   Yes [provider]  Multiple Vitamins-Minerals (HAIR/SKIN/NAILS/BIOTIN) TABS Take 1 tablet by mouth every morning.     Yes [provider]  nitroGLYCERIN (NITROSTAT) 0.4 MG SL tablet Place 1 tablet (0.4 mg total) under the tongue every 5 (five) minutes as needed for chest pain. 11/13/16 09/16/20 Yes Kilroy, Luke K, PA-C  nystatin cream (MYCOSTATIN) Apply 1 application topically 2 (two) times daily as needed (rash). 06/14/20   Yes [provider]  OXYGEN Inhale 3 L/min into the lungs continuous.      Yes [provider]  pantoprazole (PROTONIX) 40 MG tablet Take 1 tablet (40 mg total) by mouth 2 (two) times daily. 07/08/19   Yes Nandigam, Venia Minks, MD  polyethylene glycol (MIRALAX / GLYCOLAX) 17 g packet Take 17 g by mouth daily.     Yes [provider]  potassium chloride SA (KLOR-CON) 20 MEQ tablet Take 20 mEq by mouth every morning.     Yes [provider]  predniSONE (DELTASONE) 10 MG tablet Take 10 mg by mouth daily as needed (as directed for gout flares).  02/15/19   Yes [provider]  ranolazine (RANEXA) 1000 MG SR tablet TAKE 1 TABLET BY MOUTH  TWICE DAILY Patient taking differently: Take 1,000 mg by mouth 2 (two) times daily. 07/12/20   Yes Croitoru, [name], MD  rosuvastatin (CRESTOR) 20 MG tablet Take 1 tablet (20 mg total) by mouth daily. NEED OV. Patient taking differently: Take 20 mg by mouth every evening. NEED OV. 08/17/20   Yes Croitoru, [name], MD  torsemide (DEMADEX) 20 MG tablet Take 1 tablet (20 mg total) by mouth daily. Patient taking differently: Take 20 mg by mouth 2 (two) times daily with breakfast and lunch. 04/25/20  Yes Croitoru, [name], MD  ACCU-CHEK AVIVA PLUS test strip   10/19/19     [provider]  Accu-Chek Softclix Lancets lancets   08/10/19     [provider]  Alcohol Swabs (B-D SINGLE USE SWABS REGULAR) PADS   08/10/19     [provider]  bisoprolol (ZEBETA) 5 MG tablet Take 1 tablet (5 mg total) by mouth daily. Patient not taking: No sig reported 03/06/20     Croitoru, [name], MD  diclofenac sodium (VOLTAREN) 1 % GEL Apply 2 g  topically 4 (four) times daily. Patient not taking: Reported on 09/16/2020 05/02/18     Norval Morton, MD      Social History         Socioeconomic History   Marital status: Widowed      Spouse name: Not on file   Number of children: 5   Years of education: Not on file   Highest education level: Not on file  Occupational History   Occupation: STAFF/BUFFET      Employer: [employer]  Tobacco Use   Smoking status: Former      Packs/day: 0.25      Years: 3.00      Pack years: 0.75      Types: Cigarettes      Quit date: 02/11/1968      Years since quitting: 52.6   Smokeless tobacco: Never  Vaping Use   Vaping Use: Never used  Substance and Sexual Activity   Alcohol use: No   Drug use: No   Sexual activity: Never  Other Topics Concern   Not on file  Social History Narrative    Widowed last year. She lives with her two sons.    Social Determinants of Health    Financial Resource Strain: Not on file  Food Insecurity: Not on file  Transportation Needs: Not on file  Physical Activity: Not on file  Stress: Not on file  Social Connections: Not on file  Intimate Partner Violence: Not on file             Family History  Problem Relation Age of Onset   Breast cancer Mother     Diabetes Mother     Heart disease Maternal Grandmother     Kidney disease Maternal Grandmother     Diabetes Maternal Grandmother     Glaucoma Maternal Aunt     Heart disease Maternal Aunt     Asthma Sister     Colon cancer Neg Hx     Stomach cancer Neg Hx     Pancreatic cancer Neg Hx        ROS: Otherwise negative unless mentioned in HPI   Physical Examination       Vitals:    09/17/20 1111 09/17/20 1200  BP:   122/68  Pulse:   77  Resp:   16  Temp: 98.1 F (36.7 C) 98.1 F (36.7 C)  SpO2:   99%    Body mass index is 43.91 kg/m.   General:  WDWN in NAD Gait: Not observed HENT: WNL, normocephalic Pulmonary: normal non-labored breathing, without Rales, rhonchi,   wheezing Cardiac: regular Abdomen: soft, NT/ND, no masses Skin: without rashes Vascular Exam/Pulses: Symmetrical radial pulses; symmetrical DP pulses Extremities: without ischemic changes, without Gangrene , without cellulitis; without open wounds;  Musculoskeletal: no muscle wasting or atrophy       Neurologic: A&O X 3;  CN grossly intact Psychiatric:  The pt has Normal affect.  Lymph:  Unremarkable   CBC Labs (Brief)          Component Value Date/Time    WBC 6.2 09/17/2020 0500    RBC 4.51 09/17/2020 0500    HGB 13.2 09/17/2020 0500    HGB 11.6 03/28/2019 1048    HCT 45.1 09/17/2020 0500    HCT 36.1 03/28/2019 1048    PLT 138 (L) 09/17/2020 0500    PLT 195 03/28/2019 1048    MCV 100.0 09/17/2020 0500    MCV 88 03/28/2019 1048    MCH 29.3 09/17/2020 0500    MCHC 29.3 (L) 09/17/2020 0500    RDW 14.1 09/17/2020 0500    RDW 13.9 03/28/2019 1048    LYMPHSABS 1.0 09/17/2020 0500    MONOABS 0.5 09/17/2020 0500    EOSABS 0.1 09/17/2020 0500    BASOSABS 0.0 09/17/2020 0500        BMET Labs (Brief)          Component Value Date/Time    NA 136 09/17/2020 0500    NA 138 03/28/2019 1048    K 4.3 09/17/2020 0500    CL 99 09/17/2020 0500    CO2 26 09/17/2020 0500    GLUCOSE 127 (H) 09/17/2020 0500    BUN 15 09/17/2020 0500    BUN 52 (H) 03/28/2019 1048    CREATININE 1.12 (H) 09/17/2020 0500    CREATININE 1.05 04/04/2014 1014    CALCIUM 9.1 09/17/2020 0500    GFRNONAA 51 (L) 09/17/2020 0500    GFRAA 25 (L) 06/02/2019 0530        COAGS: Recent Labs       Lab Results  Component Value Date    INR 1.0 09/16/2020    INR SPECIMEN CLOTTED 09/16/2020    INR 0.9 04/01/2020          Non-Invasive Vascular Imaging:   Carotid duplex demonstrates 80 to 99% stenosis of right ICA and 40 to 59% stenosis of left ICA       ASSESSMENT/PLAN: This is a 75 y.o. female with R CVA/TIA and critical right ICA stenosis   -Carotid duplex demonstrates 80 to 99% stenosis of right  ICA -Right-sided CVA/TIA and left sided facial weakness are consistent with critical right ICA stenosis -Patient states she has a anaphylactic reaction to contrast dye; she is unsure if she is ever been premedicated to receive contrast for example before coronary catheterization however I will defer further imaging decision to Dr. Carlis Abbott -On-call vascular surgeon Dr. Carlis Abbott will evaluate the patient later today and provide further treatment plans regarding revascularization of right ICA to prevent further neurological events     Dagoberto Ligas PA-C [facility] [phone]   I have seen and evaluated the patient. I agree with the PA note as documented above.  75 year old female with HTN, HLD, CAD s/p CABG, DM, COPD, CHF that vascular surgery has been consulted for symptomatic right ICA carotid stenosis that appears high-grade.  States she presented to the ED yesterday with slurred speech as well as facial droop and left-sided weakness.  She denies any prior history of strokes or TIAs.  Carotid ultrasound did show greater than 80% right ICA stenosis and vascular surgery was consulted.  She feels she is back at her baseline except for some ongoing slurred speech.  She had a contralateral 40 to 59% left ICA stenosis.  I discussed with her that she would likely benefit from right carotid revascularization for long-term stroke risk reduction.  Unfortunately her  carotid ultrasound was very limited in evaluating the bifurcation or the distal extent of the lesion given her obesity and the depth of her vessels.  She does have a contrast allergy but in reviewing records she has had cardiac cath with contrast in 2019 and 2021 and has had this contrast allergy for over 15 to 20 years according to her.  We discussed that we could plan for premedication and I have ordered a CTA neck with the premedication protocol including prednisone and benadryl.  Discussed after review of imaging could make decision  about carotid endarterectomy versus TCAR.  Likely will be scheduled on an outpatient basis within the next several weeks as I discussed with her today and she does want to go home if possible.  Will likely remain on aspirin Plavix.  We will continue to follow.   Alicia Heck, MD [facility] [place] Office: [phone]

## 2020-10-01 NOTE — Progress Notes (Addendum)
Paged [employer], waiting on a call back.    Update: [name] states that he will be arriving at [facility] in approximately 30 minutes.

## 2020-10-01 NOTE — Anesthesia Postprocedure Evaluation (Signed)
Anesthesia Post Note  Patient: TAMEYA KUZNIA  Procedure(s) Performed: RIGHT TRANSCAROTID ARTERY REVASCULARIZATION (Right: Neck) ULTRASOUND GUIDANCE FOR VASCULAR ACCESS (Left: Groin)     Patient location during evaluation: PACU Anesthesia Type: General Level of consciousness: awake and alert Pain management: pain level controlled Vital Signs Assessment: post-procedure vital signs reviewed and stable Respiratory status: spontaneous breathing, nonlabored ventilation, respiratory function stable and patient connected to nasal cannula oxygen Cardiovascular status: blood pressure returned to baseline and stable Postop Assessment: no apparent nausea or vomiting Anesthetic complications: no   No notable events documented.  Last Vitals:  Vitals:   10/01/20 1241 10/01/20 1256  BP: 132/77 133/69  Pulse: (!) 105 84  Resp: (!) 21 (!) 21  Temp:    SpO2: 98% 94%    Last Pain:  Vitals:   10/01/20 1241  TempSrc:   PainSc: 0-No pain                 Audry Pili

## 2020-10-01 NOTE — Anesthesia Procedure Notes (Signed)
Procedure Name: Intubation Date/Time: 10/01/2020 10:18 AM Performed by: Annamary Carolin, CRNA Pre-anesthesia Checklist: Patient identified, Emergency Drugs available, Suction available and Patient being monitored Patient Re-evaluated:Patient Re-evaluated prior to induction Oxygen Delivery Method: Circle System Utilized Preoxygenation: Pre-oxygenation with 100% oxygen Induction Type: IV induction Ventilation: Mask ventilation without difficulty Laryngoscope Size: Glidescope and 4 Grade View: Grade I Tube type: Oral Tube size: 7.0 mm Number of attempts: 1 Airway Equipment and Method: Stylet and Oral airway Placement Confirmation: ETT inserted through vocal cords under direct vision, positive ETCO2 and breath sounds checked- equal and bilateral Tube secured with: Tape Dental Injury: Teeth and Oropharynx as per pre-operative assessment  Comments: With ease

## 2020-10-01 NOTE — Op Note (Signed)
Date: October 01, 2020  Preoperative diagnosis: High-grade symptomatic right internal carotid artery stenosis (>80%)  Postoperative diagnosis: Same  Procedure: 1.  Ultrasound-guided access of left common femoral vein for placement of venous flow reversal sheath 2.  Transcatheter placement of intravascular stent in the right carotid artery after cutdown with percutaneous access including angioplasty and distal embolic protection with flow reversal (right TCAR)  Surgeon: Dr. [name], MD  Assistant: Roxy Horseman, [place]  Indications: Patient is a 75 year old female who was seen on 09/17/2020 with a high-grade right ICA stenosis and right brain event.  Ultimately recommended right carotid intervention with carotid stenting given high bifurcation and morbidly obese patient with limited neck mobility and the artery was retropharyngeal.  She presents today for right TCAR after risk benefits discussed.  Findings: Initial access of the left common femoral vein with ultrasound to place the TCAR venous flow reversal sheath.  Then cutdown on the right common carotid artery just above the clavicle.  Ultimately she had a very deep artery and we were able to access this with a micro access needle and placed a microwire and then a micro sheath and then got our working arterial sheath in place.  The lesion was then crossed after active flow reversal was initiated and then dilated with a 5 mm x 30 mm angioplasty balloon and primarily stented with a 9 mm x 30 mm Enroute stent.  There was greater than 30% residual stenosis so elected post stent angioplasty after stent deployment with a 6 mm x 20 mm with excellent results and no residual stenosis.  Total flow reversal time was 14 minutes. She awoke neurologically intact.  Anesthesia: General  Details: Patient was taken to the operating room after informed consent was obtained.  Placed on the operative table in the supine position.  General endotracheal  anesthesia was induced.  I marked her common carotid artery above the right clavicle given she is morbidly obese.  Ultimately her right neck and both groins were prepped and draped in standard sterile fashion.  She got preoperative antibiotics.  Timeout was performed.  Initially evaluated the left common femoral vein with ultrasound it was patent and image saved.  This accessed under ultrasound guidance with a micro access wire and placed a micro sheath and then a Bentson wire and exchanged for a TCAR flow reversal sheath.  This flushed and aspirated easily.  Then turned our attention to the right neck where a transverse incision was made 1 fingerbreadth above the right clavicle.  Dissected down with Bovie cautery and opened the platysma.  The heads of the sternocleidomastoid were opened with a muscle-sparing technique.  Identified jugular vein and vagus nerve that were mobilized lateral to the artery.  I dissected out the common carotid artery in the base of the wound and a vessel loop and umbilical tape were placed.  Patient was given 100 units/kg IV heparin.  We checked an ACT to ensure was greater than 250.  I put a small rolled up sponge underneath the artery to raise it given it was very deep in the wound given her morbid obesity.  I placed a 5-0 pursestring on the anterior wall of the artery.  This was accessed with a micro access needle placed a microwire and then a micro sheath.  We got a right carotid angiogram to identify the bifurcation.  We then placed a stiff J-wire below the bifurcation and then got our large arterial sheath in place in the right common  carotid artery.  We then connected the filter to the venous sheath in the groin.  We had good passive flow reversal.  We then performed another angiogram with right anterior oblique to identify the bifurcation and the takeoff of the ICA where the lesion was located.  We then went on active clamp after the common carotid was clamped just below the  arterial sheath.  We had good active flow reversal.  The lesion was crossed with an 018 wire and then angioplasty was performed with a 5 mm x 30 mm balloon and then stent was placed with a 9 mm x 30 mm stent in the proximal internal carotid across the bifurcation into the distal common carotid.  We landed stent just below the tortuosity of the vessel.  There was more than 30% residual stenosis at the carotid bifurcation so postdilated with a 6 mm x 20 mm balloon.  No residual stenosis after this post angioplasty.  Total flow reversal time was 14 minutes.  That point in time wires and catheters were removed.  We came off of active clamp.  The arterial sheath was removed tied down the pursestring 5-0 Prolene with good hemostasis.  We listen with a pencil Doppler and had good flow.  The wound was irrigated out.  50 mg protamine was given and the venous sheath in the groin was removed and manual pressure was held.  I closed the incision above the right clavicle with 3-0 Vicryl 4-0 Monocryl and Dermabond.  Awakened from anesthesia with no deficits.  Complications: None  Condition: Stable  [name], MD [facility] Office: [place]

## 2020-10-02 ENCOUNTER — Other Ambulatory Visit: Payer: Self-pay

## 2020-10-02 ENCOUNTER — Encounter (HOSPITAL_COMMUNITY): Payer: Self-pay | Admitting: Vascular Surgery

## 2020-10-02 LAB — BASIC METABOLIC PANEL
Anion gap: 8 (ref 5–15)
BUN: 26 mg/dL — ABNORMAL HIGH (ref 8–23)
CO2: 34 mmol/L — ABNORMAL HIGH (ref 22–32)
Calcium: 8.9 mg/dL (ref 8.9–10.3)
Chloride: 98 mmol/L (ref 98–111)
Creatinine, Ser: 1.66 mg/dL — ABNORMAL HIGH (ref 0.44–1.00)
GFR, Estimated: 32 mL/min — ABNORMAL LOW (ref >60)
Glucose, Bld: 156 mg/dL — ABNORMAL HIGH (ref 70–99)
Potassium: 4.2 mmol/L (ref 3.5–5.1)
Sodium: 140 mmol/L (ref 135–145)

## 2020-10-02 LAB — LIPID PANEL
Cholesterol: 169 mg/dL (ref 0–200)
HDL: 50 mg/dL (ref >40)
LDL Cholesterol: 94 mg/dL (ref 0–99)
Total CHOL/HDL Ratio: 3.4 RATIO
Triglycerides: 124 mg/dL (ref <150)
VLDL: 25 mg/dL (ref 0–40)

## 2020-10-02 LAB — GLUCOSE, CAPILLARY
Glucose-Capillary: 140 mg/dL — ABNORMAL HIGH (ref 70–99)
Glucose-Capillary: 115 mg/dL — ABNORMAL HIGH (ref 70–99)

## 2020-10-02 LAB — CBC
HCT: 39.5 % (ref 36.0–46.0)
Hemoglobin: 11.7 g/dL — ABNORMAL LOW (ref 12.0–15.0)
MCH: 29.5 pg (ref 26.0–34.0)
MCHC: 29.6 g/dL — ABNORMAL LOW (ref 30.0–36.0)
MCV: 99.5 fL (ref 80.0–100.0)
Platelets: 165 10*3/uL (ref 150–400)
RBC: 3.97 MIL/uL (ref 3.87–5.11)
RDW: 14.5 % (ref 11.5–15.5)
WBC: 10.5 10*3/uL (ref 4.0–10.5)
nRBC: 0 % (ref 0.0–0.2)

## 2020-10-02 MED ORDER — HYDROXYZINE HCL 25 MG PO TABS
50.0000 mg | ORAL_TABLET | Freq: Four times a day (QID) | ORAL | Status: DC | PRN
  Administered 2020-10-02: 50 mg via ORAL
  Filled 2020-10-02: qty 2

## 2020-10-02 MED ORDER — PHENYLEPHRINE HCL-NACL 20-0.9 MG/250ML-% IV SOLN
INTRAVENOUS | Status: AC
  Filled 2020-10-02: qty 500

## 2020-10-02 NOTE — Progress Notes (Addendum)
[facility]  Subjective  - feels like she is doing OK.  She has history of COPD with home O2 and swallowing difficulties prior to OR.   Objective (!) 101/51 75 97.7 F (36.5 C) (Oral) 20 93%  Intake/Output Summary (Last 24 hours) at 10/02/2020 0745 Last data filed at 10/02/2020 0600 Gross per 24 hour  Intake 1550 ml  Output 700 ml  Net 850 ml    Right subclavian/neck incision without hematoma Left groin soft from venous stick site Moving all 4 ext.  No tongue deviation and no facial droop  Assessment/Planning: POD # 1 TCAR right ICA  She has not mobilized yet will order PT/OT consult Increased Cr to 1.66 with CKD HGB stable 11.7 Urine OP 650 will monitor Plan for discharge tomorrow  Roxy Horseman 10/02/2020 7:45 AM --  Laboratory Lab Results: Recent Labs    10/01/20 1733 10/02/20 0415  WBC 11.2* 10.5  HGB 13.8 11.7*  HCT 46.0 39.5  PLT 150 165   BMET Recent Labs    10/01/20 1733 10/02/20 0415  NA  --  140  K  --  4.2  CL  --  98  CO2  --  34*  GLUCOSE  --  156*  BUN  --  26*  CREATININE 1.38* 1.66*  CALCIUM  --  8.9    COAG Lab Results  Component Value Date   INR 0.9 09/26/2020   INR 1.0 09/16/2020   INR SPECIMEN CLOTTED 09/16/2020   No results found for: PTT  I have seen and evaluated the patient. I agree with the PA note as documented above.  Postop day 1 status post right TCAR for a high-grade symptomatic stenosis.  Did well overnight and is neurologically intact.  Neck incision looks good as well as groin incision.  On aspirin Plavix statin.  Feels she needs 1 more day given she had some baseline dysphagia prior to surgery that she feels is slightly worse today.  Hopefully discharge tomorrow.  [name], MD [facility] [place] Office: [phone]

## 2020-10-02 NOTE — Care Management (Signed)
Verified w liaison that patient is active w [facility] for [facility] PT.

## 2020-10-02 NOTE — Progress Notes (Signed)
No neurological deficits, hemodynamics stable. SPO2 78-87% on room air. O2 NCL 3-4 LPM given per Pt baseline at home, SPO2 95% with O2. Lungs clear auscultated bilaterally. EKG- V-paced on monitor, BP stable, afebrile. Incision on right neck dry and clean, no hematoma. Pain tolerated well. No acute distress noted.   Pt had complaint of itching all over her body after getting Vancomycin IV. Dr. Donzetta Matters was notified. Due to Pt had Hx of Benadryl allergic reaction, Dr. Donzetta Matters ordered Hydroxyzine 50 mg PRN q 6 hrs per pharmacist Carlen Amend recommendation. We will monitor.  Alicia Lose, RN

## 2020-10-02 NOTE — Progress Notes (Signed)
PHARMACIST LIPID MONITORING   [name] is a 75 y.o. female admitted on 10/01/2020 with R ICA stenosis, s/p TCAR.  Pharmacy has been consulted to optimize lipid-lowering therapy with the indication of secondary prevention for clinical ASCVD.  Recent Labs:  Lipid Panel (last 6 months):   Lab Results  Component Value Date   CHOL 169 10/02/2020   TRIG 124 10/02/2020   HDL 50 10/02/2020   CHOLHDL 3.4 10/02/2020   VLDL 25 10/02/2020   LDLCALC 94 10/02/2020    Hepatic function panel (last 6 months):   Lab Results  Component Value Date   AST 17 09/26/2020   ALT 9 09/26/2020   ALKPHOS 97 09/26/2020   BILITOT 0.6 09/26/2020    SCr (since admission):   Serum creatinine: 1.66 mg/dL (H) 10/02/20 0415 Estimated creatinine clearance: 34 mL/min (A)  Current therapy and lipid therapy tolerance Current lipid-lowering therapy: Crestor 40 mg daily Previous lipid-lowering therapies (if applicable): Crestor 20 mg daily, Atorvastatin 80 mg daily (2017), Pravastatin 80 mg daily 2016-2017. Documented or reported allergies or intolerances to lipid-lowering therapies (if applicable): bilateral leg pain/ cramps while on Atorvastatin 80 mg daily (in 2017). Changed to Crestor 20 mg daily at that time.  Assessment:     Crestor dose was increased from 20 to 40 mg daily on 09/17/20, recent admission with acute CVA.  She reports tolerating so far, no leg pain/cramps.  Plan:    1.Statin intensity (high intensity recommended for all patients regardless of the LDL):  Continue high-intensity statin, Crestor 40 mg daily.    2.Follow-up labs after discharge:  No changes in lipid therapy, repeat a lipid panel in one year.      Arty Baumgartner, [place] 10/02/2020, 4:46 PM

## 2020-10-03 DIAGNOSIS — H524 Presbyopia: Secondary | ICD-10-CM | POA: Diagnosis not present

## 2020-10-03 LAB — GLUCOSE, CAPILLARY
Glucose-Capillary: 138 mg/dL — ABNORMAL HIGH (ref 70–99)
Glucose-Capillary: 124 mg/dL — ABNORMAL HIGH (ref 70–99)
Glucose-Capillary: 134 mg/dL — ABNORMAL HIGH (ref 70–99)
Glucose-Capillary: 133 mg/dL — ABNORMAL HIGH (ref 70–99)

## 2020-10-03 MED ORDER — OXYCODONE-ACETAMINOPHEN 5-325 MG PO TABS: 1.0000 | ORAL_TABLET | Freq: Four times a day (QID) | ORAL | 0 refills | Status: DC | PRN

## 2020-10-03 NOTE — Evaluation (Signed)
Physical Therapy Evaluation Patient Details Name: [name] MRN: [id number] DOB: [birth year] Today's Date: 10/03/2020   History of Present Illness  Pt is a 75 y/o female who presented with L sided facial weakness. Workup revealed 80-99% stenosis of R ICA. Pt underwent R transcarotid artery revascularization on 8/22. PMH: a fib, CAD, CHF, cellulitis, gout, IBS, DM, TIA  Clinical Impression  Pt is up to walk with help, noted her mild unsteadiness today but is better with min guard and RW.  Pt has been able to walk with no AD in this hosp stay, so should be able to recover this with rest and with less medication.  Pt is going home with her son, HHPT is requested and her goals of acute PT can carry over to follow up therapy.  Expecting to go home later today, with family supervision.    Follow Up Recommendations Supervision for mobility/OOB;Home health PT    Equipment Recommendations  None recommended by PT    Recommendations for Other Services       Precautions / Restrictions Precautions Precautions: Fall Precaution Comments: baseline O2 use, ck sats Restrictions Weight Bearing Restrictions: No      Mobility  Bed Mobility Overal bed mobility: Needs Assistance Bed Mobility: Sit to Supine       Sit to supine: Min guard   General bed mobility comments: using bed rail for support    Transfers Overall transfer level: Needs assistance Equipment used: Rolling walker (2 wheeled);1 person hand held assist Transfers: Sit to/from Stand Sit to Stand: Min guard         General transfer comment: min guard and safety with mobility was ensured with HHA  Ambulation/Gait Ambulation/Gait assistance: Min guard Gait Distance (Feet): 45 Feet Assistive device: Rolling walker (2 wheeled) Gait Pattern/deviations: Step-through pattern;Decreased stride length;Narrow base of support Gait velocity: reduced Gait velocity interpretation: <1.31 ft/sec, indicative of household  ambulator General Gait Details: pt is lethargic but reports she has been sleeping all day.  Feels improvement from her procedure  Stairs            Wheelchair Mobility    Modified Rankin (Stroke Patients Only)       Balance Overall balance assessment: Needs assistance Sitting-balance support: Feet supported Sitting balance-Leahy Scale: Good     Standing balance support: Bilateral upper extremity supported;During functional activity Standing balance-Leahy Scale: Poor Standing balance comment: requires RW                             Pertinent Vitals/Pain Pain Assessment: Faces Faces Pain Scale: Hurts a little bit Pain Location: neck at procedure site Pain Descriptors / Indicators: Sore Pain Intervention(s): Monitored during session    Home Living Family/patient expects to be discharged to:: Private residence Living Arrangements: Children Available Help at Discharge: Available PRN/intermittently;Family Type of Home: Mobile home Home Access: Stairs to enter Entrance Stairs-Rails: Left Entrance Stairs-Number of Steps: 3 Home Layout: One level Home Equipment: Walker - 4 wheels;Tub bench;Hand held shower head;Walker - 2 wheels Additional Comments: son works nights but can help in daytime    Prior Function Level of Independence: Independent with assistive device(s)         Comments: requires RW now, L side residual hemiparesis is more an issue now     Hand Dominance   Dominant Hand: Right    Extremity/Trunk Assessment   Upper Extremity Assessment Upper Extremity Assessment: Overall WFL for tasks assessed  Lower Extremity Assessment Lower Extremity Assessment: LLE deficits/detail LLE Deficits / Details: 4- strength from residual hemiparesis LLE Coordination: decreased gross motor    Cervical / Trunk Assessment Cervical / Trunk Assessment: Normal  Communication   Communication: No difficulties  Cognition Arousal/Alertness:  Awake/alert Behavior During Therapy: WFL for tasks assessed/performed Overall Cognitive Status: Within Functional Limits for tasks assessed                                        General Comments General comments (skin integrity, edema, etc.): pt is using a RW today with help but also mildy unsteady.  Has son home whom she is expecting to be available for her    Exercises     Assessment/Plan    PT Assessment Patient needs continued PT services  PT Problem List Decreased strength;Decreased mobility;Decreased activity tolerance;Decreased balance;Cardiopulmonary status limiting activity;Obesity       PT Treatment Interventions Patient/family education;Neuromuscular re-education;Balance training;Therapeutic exercise;Therapeutic activities;Functional mobility training;Stair training;Gait training;DME instruction    PT Goals (Current goals can be found in the Care Plan section)  Acute Rehab PT Goals Patient Stated Goal: go home today Time For Goal Achievement: 10/10/20    Frequency Min 3X/week   Barriers to discharge Decreased caregiver support;Inaccessible home environment stairs    Co-evaluation               AM-PAC PT "6 Clicks" Mobility  Outcome Measure Help needed turning from your back to your side while in a flat bed without using bedrails?: None Help needed moving from lying on your back to sitting on the side of a flat bed without using bedrails?: A Little Help needed moving to and from a bed to a chair (including a wheelchair)?: A Little Help needed standing up from a chair using your arms (e.g., wheelchair or bedside chair)?: A Little Help needed to walk in hospital room?: A Little Help needed climbing 3-5 steps with a railing? : A Lot 6 Click Score: 18    End of Session Equipment Utilized During Treatment: Oxygen Activity Tolerance: Patient limited by fatigue;Treatment limited secondary to medical complications (Comment) Patient left: in  bed;with call bell/phone within reach;with bed alarm set Nurse Communication: Mobility status PT Visit Diagnosis: Muscle weakness (generalized) (M62.81);Difficulty in walking, not elsewhere classified (R26.2)    Time: 4142-3953 PT Time Calculation (min) (ACUTE ONLY): 27 min   Charges:   PT Evaluation $PT Eval Moderate Complexity: 1 Mod PT Treatments $Gait Training: 8-22 mins       Ramond Dial 10/03/2020, 1:36 PM  Mee Hives, PT MS Acute Rehab Dept. Number: [place] and [place]

## 2020-10-03 NOTE — Progress Notes (Signed)
Pt to discharge today. Pt complaining of blurry vision in both eyes this am. Left eye watery and red. Scheduled eye drops administered, per order. Vitals stable. PA notified.

## 2020-10-03 NOTE — TOC Transition Note (Signed)
Transition of Care [facility]) - CM/SW Discharge Note   Patient Details  Name: [name] MRN: [id number] Date of Birth: November 15, 1945  Transition of Care [facility]) CM/SW Contact:  Carles Collet, RN Phone Number: 10/03/2020, 8:24 AM   Clinical Narrative:    Patient active w [facility] prior to admission. Resumption of services order placed, no face to face needed. [place] liaison notified of DC today. No other CM needs identified,     Final next level of care: Home w Home Health Services Barriers to Discharge: No Barriers Identified   Patient Goals and CMS Choice        Discharge Placement                       Discharge Plan and Services                          HH Arranged: PT HH Agency: [place] Date [place]: 10/03/20 Time [place]: [phone] Representative spoke with at [place]: Amy  Social Determinants of Health ([place]) Interventions     Readmission Risk Interventions No flowsheet data found.

## 2020-10-03 NOTE — Progress Notes (Signed)
Pt discharged home with son. IV and telemetry box removed. Pt left with all of her belongings. Pt discharged via wheelchair and was accompanied by a Wilfrid Lund.

## 2020-10-03 NOTE — Evaluation (Signed)
Occupational Therapy Evaluation Patient Details Name: [name] MRN: [id number] DOB: 1945/11/02 Today's Date: 10/03/2020    History of Present Illness Pt is a 75 y/o female who presented with L sided facial weakness. Workup revealed 80-99% stenosis of R ICA. Pt underwent R transcarotid artery revascularization on 8/22. PMH: a fib, CAD, CHF, cellulitis, gout, IBS, DM, TIA   Clinical Impression   PTA, pt lives with son and reports Independence with ADLs, IADLs and mobility without AD. Pt presents now with deficits in standing balance, strength and cardiopulmonary endurance. Pt overall Supervision for short distance mobility using RW, Independent for UB ADLs and min guard at most for LB ADLs. Pt noted with shakiness, requiring use of RW to maintain balance this AM. Pt likely will not need OT follow-up at DC though will recommend pt use walker for mobility at home initially to decrease fall risk. Will follow acutely to progress independence and reinforce energy conservation strategies.     Follow Up Recommendations  No OT follow up;Supervision - Intermittent    Equipment Recommendations  None recommended by OT    Recommendations for Other Services       Precautions / Restrictions Precautions Precautions: Fall Precaution Comments: monitor O2, wears 3 L O2 at baseline Restrictions Weight Bearing Restrictions: No      Mobility Bed Mobility Overal bed mobility: Modified Independent             General bed mobility comments: HOB elevated, use of bed rail    Transfers Overall transfer level: Needs assistance Equipment used: Rolling walker (2 wheeled) Transfers: Sit to/from Stand Sit to Stand: Min guard         General transfer comment: min guard for safety due to shakiness noted    Balance Overall balance assessment: Needs assistance Sitting-balance support: No upper extremity supported;Feet supported Sitting balance-Leahy Scale: Good     Standing balance  support: Bilateral upper extremity supported;During functional activity Standing balance-Leahy Scale: Poor Standing balance comment: reliant on UE support at this time                           ADL either performed or assessed with clinical judgement   ADL Overall ADL's : Needs assistance/impaired Eating/Feeding: Independent;Sitting   Grooming: Modified independent;Standing   Upper Body Bathing: Independent;Sitting   Lower Body Bathing: Min guard;Sit to/from stand   Upper Body Dressing : Independent;Sitting   Lower Body Dressing: Min guard;Sit to/from stand   Toilet Transfer: Supervision/safety;Ambulation;Regular Toilet;RW Toilet Transfer Details (indicate cue type and reason): Close supervision, use of RW needed to maintain balance. Assist for O2 cord mgmt Toileting- Clothing Manipulation and Hygiene: Modified independent;Sit to/from stand Toileting - Clothing Manipulation Details (indicate cue type and reason): no assist needed     Functional mobility during ADLs: Supervision/safety;Rolling walker General ADL Comments: Pt endorses some shakiness in standing/with mobility citing due to not having breakfast yet and suspecting low blood sugar. Use of RW needed to maintain safety and balance at this time. Reinforced energy conservation strategies and use of DME for mobility at home     Vision Baseline Vision/History: 1 Wears glasses Ability to See in Adequate Light: 0 Adequate Patient Visual Report: No change from baseline Vision Assessment?: No apparent visual deficits     Perception     Praxis      Pertinent Vitals/Pain Pain Assessment: No/denies pain     Hand Dominance Right   Extremity/Trunk Assessment Upper Extremity Assessment Upper  Extremity Assessment: Generalized weakness   Lower Extremity Assessment Lower Extremity Assessment: Defer to PT evaluation   Cervical / Trunk Assessment Cervical / Trunk Assessment: Normal   Communication  Communication Communication: No difficulties   Cognition Arousal/Alertness: Awake/alert Behavior During Therapy: WFL for tasks assessed/performed Overall Cognitive Status: Within Functional Limits for tasks assessed                                     General Comments  SpO2 90%, 2/4 DOE after toileting task on baseline 3 L O2. 95% at rest    Exercises     Shoulder Instructions      Home Living Family/patient expects to be discharged to:: Private residence Living Arrangements: Children Available Help at Discharge: Available PRN/intermittently;Family Type of Home: Mobile home Home Access: Stairs to enter Entrance Stairs-Number of Steps: 3 Entrance Stairs-Rails: Left Home Layout: One level     Bathroom Shower/Tub: Teacher, early years/pre: Handicapped height     Home Equipment: Environmental consultant - 4 wheels;Tub bench;Hand held shower head;Walker - 2 wheels   Additional Comments: 3 L of O2 at home. Son works at night      Prior Functioning/Environment Level of Independence: Independent with assistive device(s)        Comments: Pt Independent with ADLs (uses shower chair). Typically does not use AD for mobility though has access to DME if needed. Pt reports able to complete IADLs, shares with son (who works nights).        OT Problem List: Decreased strength;Decreased activity tolerance;Impaired balance (sitting and/or standing);Cardiopulmonary status limiting activity      OT Treatment/Interventions: Self-care/ADL training;Therapeutic exercise;Energy conservation;DME and/or AE instruction;Therapeutic activities;Patient/family education;Balance training    OT Goals(Current goals can be found in the care plan section) Acute Rehab OT Goals Patient Stated Goal: go home today OT Goal Formulation: With patient Time For Goal Achievement: 10/17/20 Potential to Achieve Goals: Good  OT Frequency: Min 2X/week   Barriers to D/C:            Co-evaluation               AM-PAC OT "6 Clicks" Daily Activity     Outcome Measure Help from another person eating meals?: None Help from another person taking care of personal grooming?: None Help from another person toileting, which includes using toliet, bedpan, or urinal?: A Little Help from another person bathing (including washing, rinsing, drying)?: A Little Help from another person to put on and taking off regular upper body clothing?: None Help from another person to put on and taking off regular lower body clothing?: A Little 6 Click Score: 21   End of Session Equipment Utilized During Treatment: Rolling walker;Oxygen Nurse Communication: Mobility status;Other (comment) (shakiness, request for coffee/snack)  Activity Tolerance: Patient tolerated treatment well Patient left: in chair;with call bell/phone within reach  OT Visit Diagnosis: Unsteadiness on feet (R26.81);Muscle weakness (generalized) (M62.81)                Time: 3976-7341 OT Time Calculation (min): 21 min Charges:  OT General Charges $OT Visit: 1 Visit OT Evaluation $OT Eval Moderate Complexity: 1 Mod  Malachy Chamber, OTR/L Acute Rehab Services Office: [phone]   Layla Maw 10/03/2020, 8:27 AM

## 2020-10-03 NOTE — Progress Notes (Addendum)
  Progress Note    10/03/2020 7:45 AM 2 Days Post-Op  Subjective:  some continued swallowing difficulties, unchanged from prior to surgery. Some incisional tenderness   Vitals:   10/03/20 0300 10/03/20 0726  BP: (!) 164/77 124/73  Pulse: 95 92  Resp: 20 (!) 21  Temp:  98.5 F (36.9 C)  SpO2: 98% 95%   Physical Exam: Cardiac:  regular Lungs:  non labored Incisions:  right neck incision is clean, dry and intact without swelling or hematoma Extremities:  moving extremities without deficits Neurologic: alert and oriented. CN intact. Speech coherent. Sensation is intact and equal bilaterally  CBC    Component Value Date/Time   WBC 10.5 10/02/2020 0415   RBC 3.97 10/02/2020 0415   HGB 11.7 (L) 10/02/2020 0415   HGB 11.6 03/28/2019 1048   HCT 39.5 10/02/2020 0415   HCT 36.1 03/28/2019 1048   PLT 165 10/02/2020 0415   PLT 195 03/28/2019 1048   MCV 99.5 10/02/2020 0415   MCV 88 03/28/2019 1048   MCH 29.5 10/02/2020 0415   MCHC 29.6 (L) 10/02/2020 0415   RDW 14.5 10/02/2020 0415   RDW 13.9 03/28/2019 1048   LYMPHSABS 1.0 09/17/2020 0500   MONOABS 0.5 09/17/2020 0500   EOSABS 0.1 09/17/2020 0500   BASOSABS 0.0 09/17/2020 0500    BMET    Component Value Date/Time   NA 140 10/02/2020 0415   NA 138 03/28/2019 1048   K 4.2 10/02/2020 0415   CL 98 10/02/2020 0415   CO2 34 (H) 10/02/2020 0415   GLUCOSE 156 (H) 10/02/2020 0415   BUN 26 (H) 10/02/2020 0415   BUN 52 (H) 03/28/2019 1048   CREATININE 1.66 (H) 10/02/2020 0415   CREATININE 1.05 04/04/2014 1014   CALCIUM 8.9 10/02/2020 0415   GFRNONAA 32 (L) 10/02/2020 0415   GFRAA 25 (L) 06/02/2019 0530    INR    Component Value Date/Time   INR 0.9 09/26/2020 1517     Intake/Output Summary (Last 24 hours) at 10/03/2020 0745 Last data filed at 10/03/2020 0400 Gross per 24 hour  Intake 500 ml  Output 300 ml  Net 200 ml     Assessment/Plan:  75 y.o. female is s/p right TCAR 2 Days Post-Op   Right neck incision  is c/di/I without swelling or hematoma Hemodynamically stable VSS Has [facility] PT already arranged Continue Plavix, statin, Aspirin Okay for discharge today once she has ambulated in hallway without difficulty She will have follow up arranged in 1 month with carotid duplex  Karoline Caldwell, PA-C [facility] [phone] 10/03/2020 7:45 AM  I have seen and evaluated the patient. I agree with the PA note as documented above.  Postop day 2 status post right TCAR.  Has done well overnight with no neurologic events.  Neck incision looks good.  Plan discharge today on aspirin statin Plavix.  She has some chronic dysphagia but appears at baseline and tolerating liquids and soft diet.  We will arrange follow-up in 1 month with carotid duplex  [name], MD [facility]: [phone]

## 2020-10-03 NOTE — Discharge Instructions (Signed)
   [facility]   Discharge Instructions   Carotid Surgery  Please refer to the following instructions for your post-procedure care. Your surgeon or physician assistant will discuss any changes with you.  Activity  You are encouraged to walk as much as you can. You can slowly return to normal activities but must avoid strenuous activity and heavy lifting until your doctor tell you it's okay. Avoid activities such as vacuuming or swinging a golf club. You can drive after one week if you are comfortable and you are no longer taking prescription pain medications. It is normal to feel tired for serval weeks after your surgery. It is also normal to have difficulty with sleep habits, eating, and bowel movements after surgery. These will go away with time.  Bathing/Showering  Shower daily after you go home. Do not soak in a bathtub, hot tub, or swim until the incision heals completely.  Incision Care  Shower every day. Clean your incision with mild soap and water. Pat the area dry with a clean towel. You do not need a bandage unless otherwise instructed. Do not apply any ointments or creams to your incision. You may have skin glue on your incision. Do not peel it off. It will come off on its own in about one week. Your incision may feel thickened and raised for several weeks after your surgery. This is normal and the skin will soften over time.   For Men Only: It's okay to shave around the incision but do not shave the incision itself for 2 weeks. It is common to have numbness under your chin that could last for several months.  Diet  Resume your normal diet. There are no special food restrictions following this procedure. A low fat/low cholesterol diet is recommended for all patients with vascular disease. In order to heal from your surgery, it is CRITICAL to get adequate nutrition. Your body requires vitamins, minerals, and protein. Vegetables are the best source of  vitamins and minerals. Vegetables also provide the perfect balance of protein. Processed food has little nutritional value, so try to avoid this.  Medications  Resume taking all of your medications unless your doctor or physician assistant tells you not to. If your incision is causing pain, you may take over-the- counter pain relievers such as acetaminophen (Tylenol). If you were prescribed a stronger pain medication, please be aware these medications can cause nausea and constipation. Prevent nausea by taking the medication with a snack or meal. Avoid constipation by drinking plenty of fluids and eating foods with a high amount of fiber, such as fruits, vegetables, and grains.   Do not take Tylenol if you are taking prescription pain medications.  Follow Up  Our office will schedule a follow up appointment 2-3 weeks following discharge.  Please call us immediately for any of the following conditions  . Increased pain, redness, drainage (pus) from your incision site. . Fever of 101 degrees or higher. . If you should develop stroke (slurred speech, difficulty swallowing, weakness on one side of your body, loss of vision) you should call 911 and go to the nearest emergency room. .  Reduce your risk of vascular disease:  . Stop smoking. If you would like help call [facility] at 1-800-QUIT-NOW (1-800-784-8669) or [place] at [phone]. . Manage your cholesterol . Maintain a desired weight . Control your diabetes . Keep your blood pressure down .  If you have any questions, please call the office at [phone]. 

## 2020-10-04 ENCOUNTER — Telehealth: Payer: Self-pay

## 2020-10-04 ENCOUNTER — Telehealth: Payer: Self-pay | Admitting: Gastroenterology

## 2020-10-04 NOTE — Telephone Encounter (Signed)
Patient calls today to report difficulty swallowing. She had a TCAR on 8/22 and the difficulty was present prior to surgery. She denies any swelling, redness or pain. Says the swallowing is the same as it was. Advised her to follow up with PCP/GI and instructed her to call us sooner to her post op if she developed any s/s of infection or further problems. Patient verbalizes understanding.

## 2020-10-04 NOTE — Discharge Summary (Signed)
Carotid Discharge Summary     [name] 14-Dec-1945 75 y.o. female  [id number]  Admission Date: 10/01/2020  Discharge Date: 10/03/2020  Physician: No att. providers found  Admission Diagnosis: Carotid stenosis [I65.29]  Discharge Day services:    OT/PT  Hospital Course:  The patient was admitted to the hospital and taken to the operating room on 10/01/2020 and underwent right transcarotid artery revascularization.  The pt tolerated the procedure well and was transported to the PACU in good condition.   By POD 1, the pt neuro status remained intact. Right neck incision well appearing. No issues with left groin venous access site. She had some generalized itching overnight after receiving Vancomycin. This was treated with Hydroxyzine 50 mg. No issues following administration of Hydroxyzine. She did well otherwise overnight. Continued swallowing difficulties unchanged from prior to surgery. Hemodynamically stable.   The remainder of the hospital course consisted of increasing mobilization and increasing intake of solids without difficulty.  By POD#2, she remained neurologically intact and stable for discharge home. Incision well appearing. Pain well controlled. Dysphagia unchanged. Transition of care assisted with making sure patients outpatient physical therapy was still active with [facility]. She will continue on Aspirin, statin and Plavix. Follow up arranged in the office in 1 month with carotid duplex.    Recent Labs    10/02/20 0415  NA 140  K 4.2  CL 98  CO2 34*  GLUCOSE 156*  BUN 26*  CALCIUM 8.9   Recent Labs    10/01/20 1733 10/02/20 0415  WBC 11.2* 10.5  HGB 13.8 11.7*  HCT 46.0 39.5  PLT 150 165   No results for input(s): INR in the last 72 hours.   Discharge Instructions     Discharge patient   Complete by: As directed    Once has ambulated in hallways without any issues   Discharge disposition: 01-Home or Self Care   Discharge patient date:  10/03/2020       Discharge Diagnosis:  Carotid stenosis [I65.29]  Secondary Diagnosis: Patient Active Problem List   Diagnosis Date Noted   Carotid stenosis 10/01/2020   Ischemic cardiomyopathy 09/27/2020   Aneurysm ([place]) 09/19/2020   Acute left-sided weakness 09/16/2020   Blood blister 08/01/2020   Elevated troponin 06/01/2019   Constipation 10/21/2018   Chronic headaches 07/30/2018   Congestive heart failure (CHF) ([place]) 07/30/2018   Supplemental oxygen dependent 07/30/2018   Leukopenia 05/03/2018   Thrombocytopenia ([place]) 05/03/2018   Pneumonia due to human metapneumovirus 05/02/2018   Chest pain 05/01/2018   Allergic rhinitis 04/13/2018   S/P laparoscopic cholecystectomy 04/09/2018   Chronic respiratory failure with hypoxia and hypercapnia ([place]) 03/07/2018   Polyp of cecum    Polyp of transverse colon    Positive colorectal cancer screening using Cologuard test    Dysphagia    Acute on chronic renal failure ([place]) 11/20/2017   CAD (coronary artery disease) 11/20/2017   DM2 (diabetes mellitus, type 2) ([place]) 11/20/2017   Abdominal pain, epigastric 11/20/2017   Vasomotor rhinitis 10/16/2016   Right facial numbness 03/05/2016   CKD (chronic kidney disease), stage III ([place]) 03/05/2016   Atrial fibrillation ([place]) 10/24/2015   Chest pain with moderate risk of acute coronary syndrome 10/23/2015   History of TIA (transient ischemic attack) 10/22/2015   Complete heart block ([place]) 06/22/2015   Allergic drug rash due to anti-infective agent 04/29/2015   Fatigue 02/14/2015   Chronic diastolic CHF (congestive heart failure) ([place]) 02/14/2015   Cellulitis 12/21/2014   S/P CABG  x 5 11/29/2014   CAD S/P percutaneous coronary angioplasty    Diarrhea 07/12/2014   Generalized abdominal pain 09/98/3382   Diastolic CHF, acute on chronic (HCC) 11/04/2013   Mixed hypercholesterolemia and hypertriglyceridemia 04/22/2013   Vertigo 04/22/2013   Dyspnea on exertion 08/25/2012   Allergic to  IV contrast 07/23/2012   Unstable angina ([place]) 07/22/2012   Morbid (severe) obesity due to excess calories ([place]) 11/22/2011   Gastroesophageal reflux disease 11/22/2011   Back pain 11/22/2011   OSA (obstructive sleep apnea) 11/22/2011   HEMORRHOIDS-EXTERNAL 02/21/2010   NAUSEA 02/21/2010   ABDOMINAL PAIN -GENERALIZED 02/21/2010   PERSONAL HX COLONIC POLYPS 02/21/2010   ANEMIA 04/16/2007   Essential hypertension 04/16/2007   DIVERTICULOSIS, COLON 04/16/2007   ARTHRITIS 04/16/2007   INTERNAL HEMORRHOIDS WITHOUT MENTION COMP 04/12/2007   Asthma 04/12/2007   Cardiac resynchronization therapy pacemaker (CRT-P) in place 04/12/2007   Gastritis without bleeding 12/14/2006   COLONIC POLYPS, HYPERPLASTIC 09/27/2002   FATTY LIVER DISEASE 02/16/2002   Past Medical History:  Diagnosis Date   ABDOMINAL PAIN -GENERALIZED 02/21/2010   Qualifier: Diagnosis of  By: Chester Holstein NP, Nevin Bloodgood     AICD (automatic cardioverter/defibrillator) present    downgraded to CRT-P in 2014   Anemia    Arthritis    Asthma    Atrial fibrillation ([place]) 10/24/2015   Questionable history of A Fib/A Flutter. On device interrogation on 9/7, showed 0.56min of A Fib/A Flutter with 1% burden.  Device check in Jan 2018 showed no sustained AF (runs of less than 30 seconds). No anticoagulation indicated.   CAD (coronary artery disease)    a. 10/09/16 LHC: SVG->LAD patent, SVG->Diag patent, SVG->RCA patent, SVG->LCx occluded. EF 60%, b. 10/31/16 LHC DES to AV groove Circ, DES to intermed branch   Cellulitis and abscess of foot 12/2014   RT FOOT   CHF (congestive heart failure) (HCC)    Chronic bronchitis (HCC)    Chronic lower back pain    Complete heart block ([place]) 06/22/2015   Diverticulosis    Facial numbness 02/2016   Fatty liver disease, nonalcoholic    Gastritis    GERD (gastroesophageal reflux disease)    Gout    H/O hiatal hernia    HEMORRHOIDS-EXTERNAL 02/21/2010   Qualifier: Diagnosis of  By: Chester Holstein NP, Paula      High cholesterol    Hyperplastic colon polyp    Hypertension    IBS (irritable bowel syndrome)    Internal hemorrhoids    INTERNAL HEMORRHOIDS WITHOUT MENTION COMP 04/12/2007   Qualifier: Diagnosis of  By: Olevia Perches MD, Dora M    Nonischemic cardiomyopathy ([place]) 11/22/2011   Pt responded to BiV ICD- last EF 55-60% Sept 2017   Obesity    On home oxygen therapy    "2L at night" (10/31/2016)   OSA (obstructive sleep apnea)    "can't tolerate a mask" (10/30/2016)   PERSONAL HX COLONIC POLYPS 02/21/2010   Qualifier: Diagnosis of  By: Chester Holstein NP, Paula     Pneumonia    "couple times" (10/31/2016)   Presence of permanent cardiac pacemaker    11/02/12 [employer] V273 INTUA PPM   Right facial numbness 03/05/2016   Shortness of breath    TIA (transient ischemic attack)    "recently" (10/31/2016)   Type II diabetes mellitus (HCC)     Allergies as of 10/03/2020       Reactions   Ivp Dye [iodinated Diagnostic Agents] Shortness Of Breath   No reaction to PO contrast with non-ionic  dye.06-25-2014/rsm   Shellfish Allergy Anaphylaxis   Sulfa Antibiotics Shortness Of Breath   Iodine Hives   Atorvastatin Other (See Comments)   Pt states "causes bilateral leg pain/cramps."   Benadryl [diphenhydramine] Other (See Comments)   "THIS DRIVES ME CRAZY AND MAKES ME FEEL LIKE I AM DYING"   Colchicine Nausea And Vomiting   Contrast Media [iodinated Diagnostic Agents] Hives   Doxycycline Other (See Comments)   Unknown reaction, per patient   Uloric [febuxostat] Other (See Comments)   Unknown reaction   Cephalexin Itching, Rash   Zithromax [azithromycin] Rash        Medication List     TAKE these medications    Accu-Chek Aviva Plus test strip Generic drug: glucose blood   Accu-Chek Softclix Lancets lancets   acetaminophen 500 MG tablet Commonly known as: TYLENOL Take 1,000 mg by mouth 2 (two) times daily as needed (for pain or headaches).   albuterol 108 (90 Base) MCG/ACT  inhaler Commonly known as: VENTOLIN HFA Inhale 2 puffs into the lungs every 6 (six) hours as needed for wheezing or shortness of breath.   albuterol (2.5 MG/3ML) 0.083% nebulizer solution Commonly known as: PROVENTIL Take 3 mLs (2.5 mg total) by nebulization every 6 (six) hours as needed for wheezing or shortness of breath.   allopurinol 100 MG tablet Commonly known as: ZYLOPRIM TAKE 1 TABLET BY MOUTH  DAILY   aspirin EC 81 MG tablet Take 1 tablet (81 mg total) by mouth daily.   B-D SINGLE USE SWABS REGULAR Pads   budesonide-formoterol 160-4.5 MCG/ACT inhaler Commonly known as: SYMBICORT Inhale 2 puffs into the lungs 2 (two) times daily.   carboxymethylcellulose 0.5 % Soln Commonly known as: REFRESH PLUS Place 1 drop into both eyes 3 (three) times daily.   clopidogrel 75 MG tablet Commonly known as: PLAVIX Take 1 tablet (75 mg total) by mouth daily.   cycloSPORINE 0.05 % ophthalmic emulsion Commonly known as: RESTASIS Place 1 drop into both eyes every 12 (twelve) hours.   EPINEPHrine 0.3 mg/0.3 mL Soaj injection Commonly known as: EPI-PEN Inject 0.3 mg into the muscle once as needed (severe allergic reaction).   ferrous sulfate 325 (65 FE) MG tablet Take 325 mg by mouth daily with breakfast.   fluticasone 50 MCG/ACT nasal spray Commonly known as: FLONASE Place 1 spray into both nostrils daily.   Hair/Skin/Nails/Biotin Tabs Take 1 tablet by mouth every morning.   ipratropium 0.02 % nebulizer solution Commonly known as: ATROVENT Take 2.5 mLs (0.5 mg total) by nebulization 4 (four) times daily.   isosorbide mononitrate 30 MG 24 hr tablet Commonly known as: IMDUR Take 3 tablets (90 mg total) by mouth daily.   Jardiance 10 MG Tabs tablet Generic drug: empagliflozin Take 10 mg by mouth daily before breakfast.   loratadine 10 MG tablet Commonly known as: CLARITIN Take 10 mg by mouth daily.   nitroGLYCERIN 0.4 MG SL tablet Commonly known as: NITROSTAT Place 1  tablet (0.4 mg total) under the tongue every 5 (five) minutes as needed for chest pain.   nystatin cream Commonly known as: MYCOSTATIN Apply 1 application topically 2 (two) times daily as needed (rash).   oxyCODONE-acetaminophen 5-325 MG tablet Commonly known as: PERCOCET/ROXICET Take 1-2 tablets by mouth every 6 (six) hours as needed for moderate pain.   OXYGEN Inhale 3 L/min into the lungs continuous.   pantoprazole 40 MG tablet Commonly known as: PROTONIX Take 1 tablet (40 mg total) by mouth 2 (two) times daily.  polyethylene glycol 17 g packet Commonly known as: MIRALAX / GLYCOLAX Take 17 g by mouth daily.   potassium chloride SA 20 MEQ tablet Commonly known as: KLOR-CON Take 20 mEq by mouth every morning.   predniSONE 10 MG tablet Commonly known as: DELTASONE Take 10 mg by mouth daily as needed (as directed for gout flares).   ranolazine 1000 MG SR tablet Commonly known as: RANEXA TAKE 1 TABLET BY MOUTH  TWICE DAILY   rosuvastatin 40 MG tablet Commonly known as: CRESTOR Take 1 tablet (40 mg total) by mouth every evening. NEED OV.   torsemide 20 MG tablet Commonly known as: DEMADEX Take 1 tablet (20 mg total) by mouth daily.         Discharge Instructions:   [facility] Discharge Instructions Carotid Endarterectomy (CEA)  Please refer to the following instructions for your post-procedure care. Your surgeon or physician assistant will discuss any changes with you.  Activity  You are encouraged to walk as much as you can. You can slowly return to normal activities but must avoid strenuous activity and heavy lifting until your doctor tell you it's OK. Avoid activities such as vacuuming or swinging a golf club. You can drive after one week if you are comfortable and you are no longer taking prescription pain medications. It is normal to feel tired for serval weeks after your surgery. It is also normal to have difficulty with  sleep habits, eating, and bowel movements after surgery. These will go away with time.  Bathing/Showering  You may shower after you come home. Do not soak in a bathtub, hot tub, or swim until the incision heals completely.  Incision Care  Shower every day. Clean your incision with mild soap and water. Pat the area dry with a clean towel. You do not need a bandage unless otherwise instructed. Do not apply any ointments or creams to your incision. You may have skin glue on your incision. Do not peel it off. It will come off on its own in about one week. Your incision may feel thickened and raised for several weeks after your surgery. This is normal and the skin will soften over time. For Men Only: It's OK to shave around the incision but do not shave the incision itself for 2 weeks. It is common to have numbness under your chin that could last for several months.  Diet  Resume your normal diet. There are no special food restrictions following this procedure. A low fat/low cholesterol diet is recommended for all patients with vascular disease. In order to heal from your surgery, it is CRITICAL to get adequate nutrition. Your body requires vitamins, minerals, and protein. Vegetables are the best source of vitamins and minerals. Vegetables also provide the perfect balance of protein. Processed food has little nutritional value, so try to avoid this.  Medications  Resume taking all of your medications unless your doctor or physician assistant tells you not to.  If your incision is causing pain, you may take over-the- counter pain relievers such as acetaminophen (Tylenol). If you were prescribed a stronger pain medication, please be aware these medications can cause nausea and constipation.  Prevent nausea by taking the medication with a snack or meal. Avoid constipation by drinking plenty of fluids and eating foods with a high amount of fiber, such as fruits, vegetables, and grains. Do not take Tylenol if  you are taking prescription pain medications.  Follow Up  Our office will schedule a  follow up appointment 2-3 weeks following discharge.  Please call us immediately for any of the following conditions  Increased pain, redness, drainage (pus) from your incision site. Fever of 101 degrees or higher. If you should develop stroke (slurred speech, difficulty swallowing, weakness on one side of your body, loss of vision) you should call 911 and go to the nearest emergency room.  Reduce your risk of vascular disease:  Stop smoking. If you would like help call [facility] at 1-800-QUIT-NOW 469-263-3323) or [place] at [phone]. Manage your cholesterol Maintain a desired weight Control your diabetes Keep your blood pressure down  If you have any questions, please call the office at [phone].  Prescriptions given: Percocet 5/325 mg #15 No Refill  Disposition: Home  Patient's condition: is Good  Follow up: 1. Dr. Carlis Abbott in 2 weeks.   Wendal Wilkie PA-C [facility] [phone]   --- For [facility] Registry use ---   Modified Rankin score at D/C (0-6): 0  IV medication needed for:  1. Hypertension: No 2. Hypotension: No  Post-op Complications: No  1. Post-op CVA or TIA: No  If yes: Event classification (right eye, left eye, right cortical, left cortical, verterobasilar, other): N/a  If yes: Timing of event (intra-op, <6 hrs post-op, >=6 hrs post-op, unknown): N/a  2. CN injury: No  If yes: CN not injuried   3. Myocardial infarction: No  If yes: Dx by (EKG or clinical, Troponin): n/a  4.  CHF: No  5.  Dysrhythmia (new): No  6. Wound infection: No  7. Reperfusion symptoms: No  8. Return to OR: No  If yes: return to OR for (bleeding, neurologic, other CEA incision, other): n/a  Discharge medications: Statin use:  Yes ASA use:  Yes   Beta blocker use:  No ACE-Inhibitor use:  No  ARB use:  No CCB use: No P2Y12 Antagonist use:  Yes, [ X] Plavix, [ ]  Plasugrel, [ ]  Ticlopinine, [ ]  Ticagrelor, [ ]  Other, [ ]  No for medical reason, [ ]  Non-compliant, [ ]  Not-indicated Anti-coagulant use:  No, [ ]  Warfarin, [ ]  Rivaroxaban, [ ]  Dabigatran,

## 2020-10-05 DIAGNOSIS — N1831 Chronic kidney disease, stage 3a: Secondary | ICD-10-CM | POA: Diagnosis not present

## 2020-10-05 DIAGNOSIS — I69354 Hemiplegia and hemiparesis following cerebral infarction affecting left non-dominant side: Secondary | ICD-10-CM | POA: Diagnosis not present

## 2020-10-05 DIAGNOSIS — I6521 Occlusion and stenosis of right carotid artery: Secondary | ICD-10-CM | POA: Diagnosis not present

## 2020-10-05 DIAGNOSIS — J9611 Chronic respiratory failure with hypoxia: Secondary | ICD-10-CM | POA: Diagnosis not present

## 2020-10-05 DIAGNOSIS — I2510 Atherosclerotic heart disease of native coronary artery without angina pectoris: Secondary | ICD-10-CM | POA: Diagnosis not present

## 2020-10-05 DIAGNOSIS — E119 Type 2 diabetes mellitus without complications: Secondary | ICD-10-CM | POA: Diagnosis not present

## 2020-10-05 DIAGNOSIS — I720 Aneurysm of carotid artery: Secondary | ICD-10-CM | POA: Diagnosis not present

## 2020-10-05 DIAGNOSIS — Z9981 Dependence on supplemental oxygen: Secondary | ICD-10-CM | POA: Diagnosis not present

## 2020-10-05 DIAGNOSIS — I5032 Chronic diastolic (congestive) heart failure: Secondary | ICD-10-CM | POA: Diagnosis not present

## 2020-10-05 DIAGNOSIS — I130 Hypertensive heart and chronic kidney disease with heart failure and stage 1 through stage 4 chronic kidney disease, or unspecified chronic kidney disease: Secondary | ICD-10-CM | POA: Diagnosis not present

## 2020-10-05 NOTE — Telephone Encounter (Signed)
Spoke with the patient. Recent carotid surgery. S/P CVA. She has a history of dysphagia. Complaining of same. States she is on a liquid diet. Tolerating. She will begin coughing with some solid foods. Reviewed with her precautions for her dysphagia including small bites with swallows of fluid between bites, add broth, melted butter or soft margarine, gravy, sauces, milk, or juice to your foods for extra moisture. Foods that are not soft or moist enough may need to be diced, minced, finely shaved, or mashed. Continue her medication as directed. Follow up appointment scheduled for 12/04/20.

## 2020-10-05 NOTE — Telephone Encounter (Deleted)
Spoke with the patient. Recent carotid surgery. S?/P CVA.

## 2020-10-06 DIAGNOSIS — J9612 Chronic respiratory failure with hypercapnia: Secondary | ICD-10-CM | POA: Diagnosis not present

## 2020-10-06 DIAGNOSIS — J9611 Chronic respiratory failure with hypoxia: Secondary | ICD-10-CM | POA: Diagnosis not present

## 2020-10-06 DIAGNOSIS — I5022 Chronic systolic (congestive) heart failure: Secondary | ICD-10-CM | POA: Diagnosis not present

## 2020-10-09 DIAGNOSIS — N1831 Chronic kidney disease, stage 3a: Secondary | ICD-10-CM | POA: Diagnosis not present

## 2020-10-09 DIAGNOSIS — I2510 Atherosclerotic heart disease of native coronary artery without angina pectoris: Secondary | ICD-10-CM | POA: Diagnosis not present

## 2020-10-09 DIAGNOSIS — Z9981 Dependence on supplemental oxygen: Secondary | ICD-10-CM | POA: Diagnosis not present

## 2020-10-09 DIAGNOSIS — I720 Aneurysm of carotid artery: Secondary | ICD-10-CM | POA: Diagnosis not present

## 2020-10-09 DIAGNOSIS — I69354 Hemiplegia and hemiparesis following cerebral infarction affecting left non-dominant side: Secondary | ICD-10-CM | POA: Diagnosis not present

## 2020-10-09 DIAGNOSIS — I5032 Chronic diastolic (congestive) heart failure: Secondary | ICD-10-CM | POA: Diagnosis not present

## 2020-10-09 DIAGNOSIS — J9611 Chronic respiratory failure with hypoxia: Secondary | ICD-10-CM | POA: Diagnosis not present

## 2020-10-09 DIAGNOSIS — I130 Hypertensive heart and chronic kidney disease with heart failure and stage 1 through stage 4 chronic kidney disease, or unspecified chronic kidney disease: Secondary | ICD-10-CM | POA: Diagnosis not present

## 2020-10-09 DIAGNOSIS — E119 Type 2 diabetes mellitus without complications: Secondary | ICD-10-CM | POA: Diagnosis not present

## 2020-10-09 DIAGNOSIS — I6521 Occlusion and stenosis of right carotid artery: Secondary | ICD-10-CM | POA: Diagnosis not present

## 2020-10-11 DIAGNOSIS — E119 Type 2 diabetes mellitus without complications: Secondary | ICD-10-CM | POA: Diagnosis not present

## 2020-10-11 DIAGNOSIS — I5032 Chronic diastolic (congestive) heart failure: Secondary | ICD-10-CM | POA: Diagnosis not present

## 2020-10-11 DIAGNOSIS — I130 Hypertensive heart and chronic kidney disease with heart failure and stage 1 through stage 4 chronic kidney disease, or unspecified chronic kidney disease: Secondary | ICD-10-CM | POA: Diagnosis not present

## 2020-10-11 DIAGNOSIS — Z9981 Dependence on supplemental oxygen: Secondary | ICD-10-CM | POA: Diagnosis not present

## 2020-10-11 DIAGNOSIS — I69354 Hemiplegia and hemiparesis following cerebral infarction affecting left non-dominant side: Secondary | ICD-10-CM | POA: Diagnosis not present

## 2020-10-11 DIAGNOSIS — I2510 Atherosclerotic heart disease of native coronary artery without angina pectoris: Secondary | ICD-10-CM | POA: Diagnosis not present

## 2020-10-11 DIAGNOSIS — J9611 Chronic respiratory failure with hypoxia: Secondary | ICD-10-CM | POA: Diagnosis not present

## 2020-10-11 DIAGNOSIS — I6521 Occlusion and stenosis of right carotid artery: Secondary | ICD-10-CM | POA: Diagnosis not present

## 2020-10-11 DIAGNOSIS — N1831 Chronic kidney disease, stage 3a: Secondary | ICD-10-CM | POA: Diagnosis not present

## 2020-10-11 DIAGNOSIS — I720 Aneurysm of carotid artery: Secondary | ICD-10-CM | POA: Diagnosis not present

## 2020-10-17 ENCOUNTER — Telehealth: Payer: Self-pay

## 2020-10-17 DIAGNOSIS — Z9981 Dependence on supplemental oxygen: Secondary | ICD-10-CM | POA: Diagnosis not present

## 2020-10-17 DIAGNOSIS — I69354 Hemiplegia and hemiparesis following cerebral infarction affecting left non-dominant side: Secondary | ICD-10-CM | POA: Diagnosis not present

## 2020-10-17 DIAGNOSIS — J9611 Chronic respiratory failure with hypoxia: Secondary | ICD-10-CM | POA: Diagnosis not present

## 2020-10-17 DIAGNOSIS — I2510 Atherosclerotic heart disease of native coronary artery without angina pectoris: Secondary | ICD-10-CM | POA: Diagnosis not present

## 2020-10-17 DIAGNOSIS — N1831 Chronic kidney disease, stage 3a: Secondary | ICD-10-CM | POA: Diagnosis not present

## 2020-10-17 DIAGNOSIS — I720 Aneurysm of carotid artery: Secondary | ICD-10-CM | POA: Diagnosis not present

## 2020-10-17 DIAGNOSIS — E119 Type 2 diabetes mellitus without complications: Secondary | ICD-10-CM | POA: Diagnosis not present

## 2020-10-17 DIAGNOSIS — I6521 Occlusion and stenosis of right carotid artery: Secondary | ICD-10-CM | POA: Diagnosis not present

## 2020-10-17 DIAGNOSIS — I5032 Chronic diastolic (congestive) heart failure: Secondary | ICD-10-CM | POA: Diagnosis not present

## 2020-10-17 DIAGNOSIS — I130 Hypertensive heart and chronic kidney disease with heart failure and stage 1 through stage 4 chronic kidney disease, or unspecified chronic kidney disease: Secondary | ICD-10-CM | POA: Diagnosis not present

## 2020-10-17 NOTE — Telephone Encounter (Signed)
Patient calls today to report that over the last day, her left eye has been blood shot and red. She is s/p right TCAR on 10/01/2020. She was "swimmy-headed" this morning and currently has a headache that is about a 6. No facial weakness, slurring of speech. She was just seen by PT, and she said according to him, her BP was "good." Discussed with provider, this is unlikely related to TCAR. Informed patient, reviewed s/s of stroke and instructed to call back if she had further concerns.

## 2020-10-23 DIAGNOSIS — Z9981 Dependence on supplemental oxygen: Secondary | ICD-10-CM | POA: Diagnosis not present

## 2020-10-23 DIAGNOSIS — E119 Type 2 diabetes mellitus without complications: Secondary | ICD-10-CM | POA: Diagnosis not present

## 2020-10-23 DIAGNOSIS — J9611 Chronic respiratory failure with hypoxia: Secondary | ICD-10-CM | POA: Diagnosis not present

## 2020-10-23 DIAGNOSIS — I5032 Chronic diastolic (congestive) heart failure: Secondary | ICD-10-CM | POA: Diagnosis not present

## 2020-10-23 DIAGNOSIS — I720 Aneurysm of carotid artery: Secondary | ICD-10-CM | POA: Diagnosis not present

## 2020-10-23 DIAGNOSIS — I6521 Occlusion and stenosis of right carotid artery: Secondary | ICD-10-CM | POA: Diagnosis not present

## 2020-10-23 DIAGNOSIS — N1831 Chronic kidney disease, stage 3a: Secondary | ICD-10-CM | POA: Diagnosis not present

## 2020-10-23 DIAGNOSIS — I2510 Atherosclerotic heart disease of native coronary artery without angina pectoris: Secondary | ICD-10-CM | POA: Diagnosis not present

## 2020-10-23 DIAGNOSIS — I69354 Hemiplegia and hemiparesis following cerebral infarction affecting left non-dominant side: Secondary | ICD-10-CM | POA: Diagnosis not present

## 2020-10-23 DIAGNOSIS — I130 Hypertensive heart and chronic kidney disease with heart failure and stage 1 through stage 4 chronic kidney disease, or unspecified chronic kidney disease: Secondary | ICD-10-CM | POA: Diagnosis not present

## 2020-10-24 DIAGNOSIS — J3489 Other specified disorders of nose and nasal sinuses: Secondary | ICD-10-CM | POA: Insufficient documentation

## 2020-10-24 DIAGNOSIS — R0981 Nasal congestion: Secondary | ICD-10-CM | POA: Insufficient documentation

## 2020-10-26 ENCOUNTER — Other Ambulatory Visit: Payer: Self-pay

## 2020-10-26 DIAGNOSIS — I6529 Occlusion and stenosis of unspecified carotid artery: Secondary | ICD-10-CM

## 2020-10-30 DIAGNOSIS — Z9981 Dependence on supplemental oxygen: Secondary | ICD-10-CM | POA: Diagnosis not present

## 2020-10-30 DIAGNOSIS — I2510 Atherosclerotic heart disease of native coronary artery without angina pectoris: Secondary | ICD-10-CM | POA: Diagnosis not present

## 2020-10-30 DIAGNOSIS — I130 Hypertensive heart and chronic kidney disease with heart failure and stage 1 through stage 4 chronic kidney disease, or unspecified chronic kidney disease: Secondary | ICD-10-CM | POA: Diagnosis not present

## 2020-10-30 DIAGNOSIS — I720 Aneurysm of carotid artery: Secondary | ICD-10-CM | POA: Diagnosis not present

## 2020-10-30 DIAGNOSIS — N1831 Chronic kidney disease, stage 3a: Secondary | ICD-10-CM | POA: Diagnosis not present

## 2020-10-30 DIAGNOSIS — I5032 Chronic diastolic (congestive) heart failure: Secondary | ICD-10-CM | POA: Diagnosis not present

## 2020-10-30 DIAGNOSIS — J9611 Chronic respiratory failure with hypoxia: Secondary | ICD-10-CM | POA: Diagnosis not present

## 2020-10-30 DIAGNOSIS — I69354 Hemiplegia and hemiparesis following cerebral infarction affecting left non-dominant side: Secondary | ICD-10-CM | POA: Diagnosis not present

## 2020-10-30 DIAGNOSIS — E119 Type 2 diabetes mellitus without complications: Secondary | ICD-10-CM | POA: Diagnosis not present

## 2020-10-30 DIAGNOSIS — I6521 Occlusion and stenosis of right carotid artery: Secondary | ICD-10-CM | POA: Diagnosis not present

## 2020-10-30 IMAGING — RF DG ESOPHAGUS
9 of 11 series · 14 of 24 positions shown · non-contrast
Comparison: Chest CT 08/18/2013

CLINICAL DATA: Esophageal dysphagia. History of CABG and pacemaker.

EXAM:
ESOPHOGRAM / BARIUM SWALLOW / BARIUM TABLET STUDY
TECHNIQUE: Combined double contrast and single contrast examination performed
using effervescent crystals, thick barium liquid, and thin barium
liquid. The patient was observed with fluoroscopy swallowing a 13 mm
barium sulphate tablet.
FLUOROSCOPY TIME:  Fluoroscopy Time: 1 minutes and 36 seconds of
low-dose pulsed fluoroscopy
Radiation Exposure Index (if provided by the fluoroscopic device):
271 mGy
Number of Acquired Spot Images: 0

[Series 1: sequence · 2 of 31 frames shown (1 of 8)]
[frame 5/31]
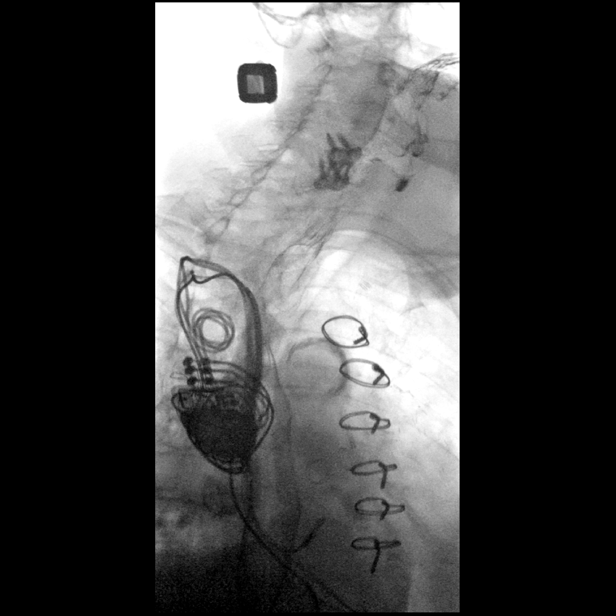
[frame 27/31]
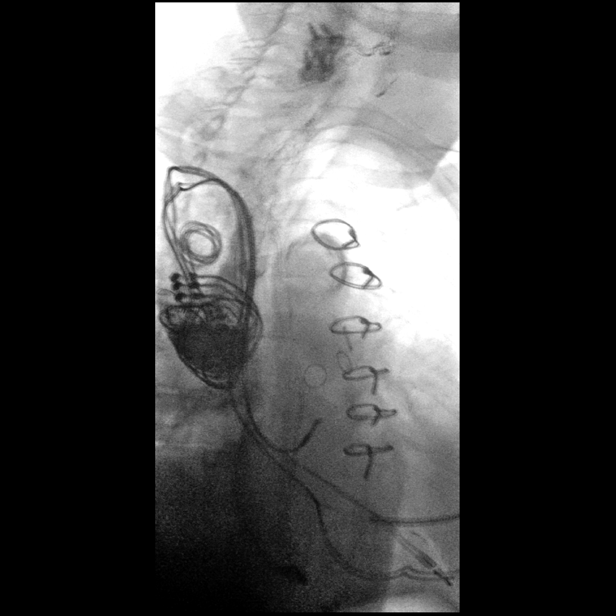

[Series 2: sequence · 1 of 23 frames shown (2 of 8)]
[frame 16/23]
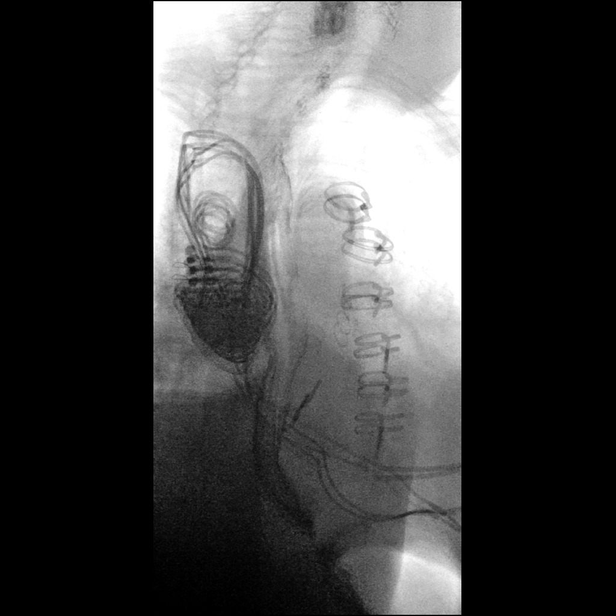

[Series 4: sequence · 2 of 19 frames shown (3 of 8)]
[frame 10/19]
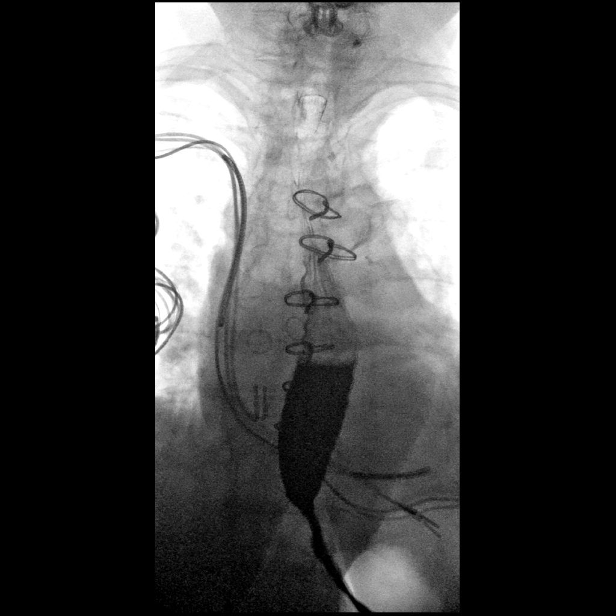
[frame 13/19]
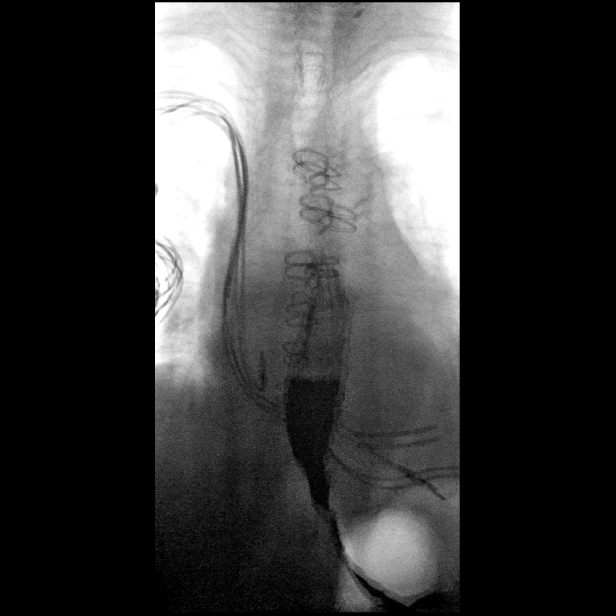

[Series 5: sequence · 1 of 12 frames shown (4 of 8)]
[frame 3/12]
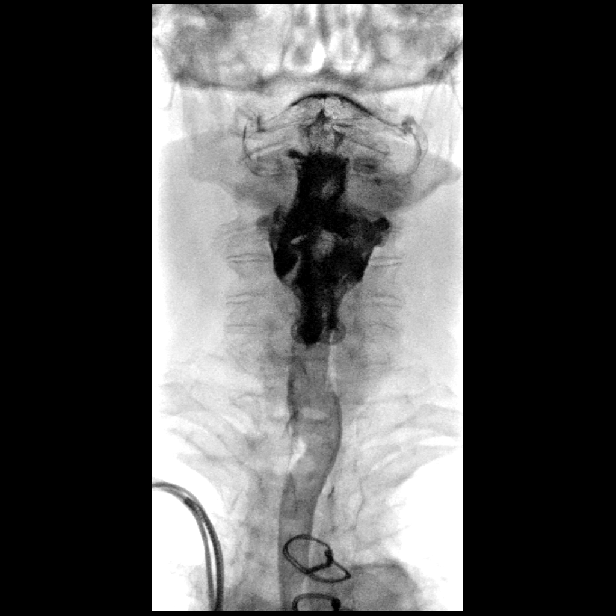

[Series 6: sequence · 2 of 20 frames shown (5 of 8)]
[frame 11/20]
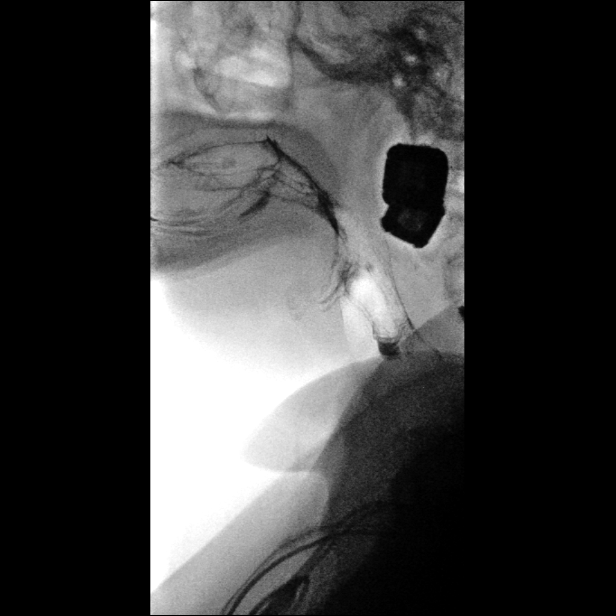
[frame 18/20]
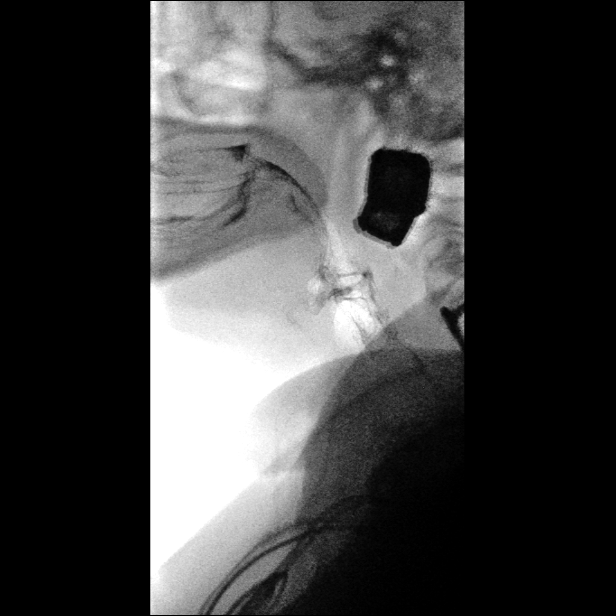

[Series 7: sequence · 1 of 33 frames shown (6 of 8)]
[frame 29/33]
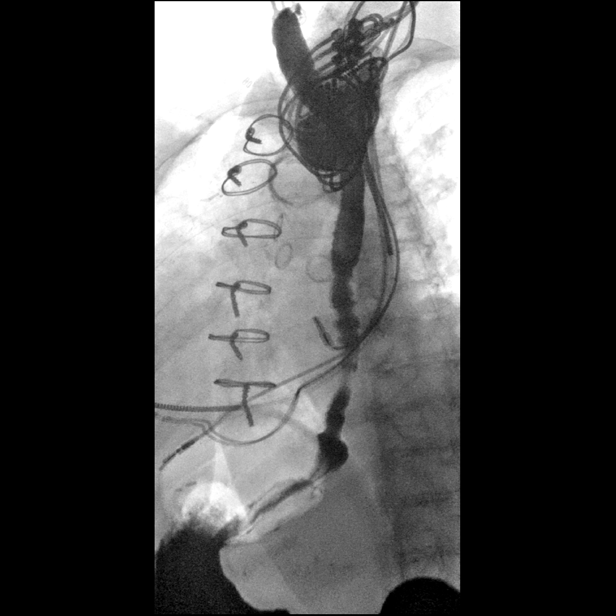

[Series 9: sequence · 2 of 15 frames shown (7 of 8)]
[frame 3/15]
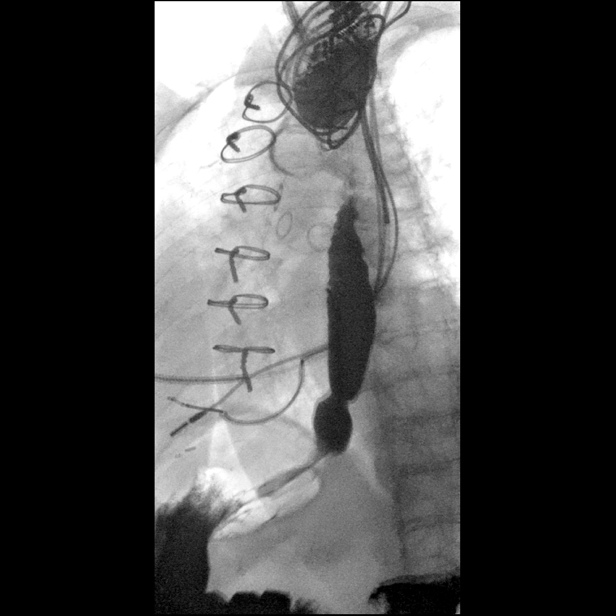
[frame 13/15]
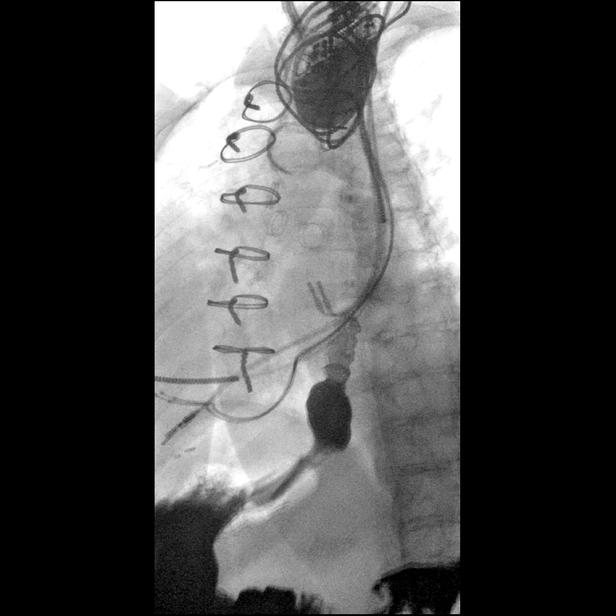

[Series 10: sequence · 1 of 5 frames shown (8 of 8)]
[frame 3/5]
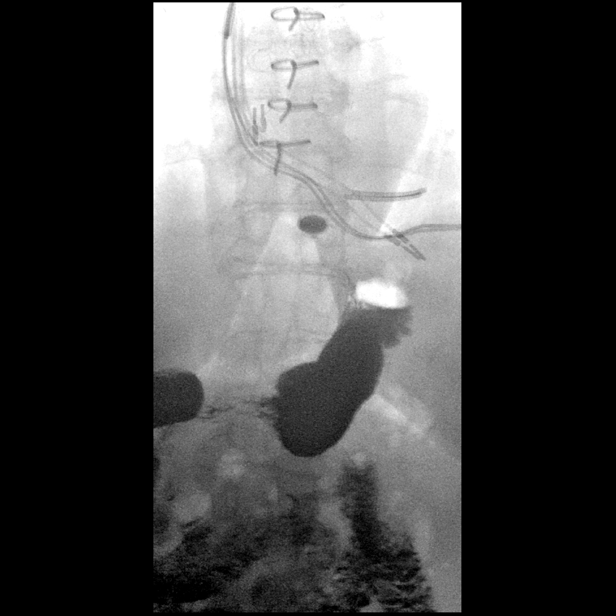

[Series 11: one shot · 2 of 5 slices shown]
[im 2/5]
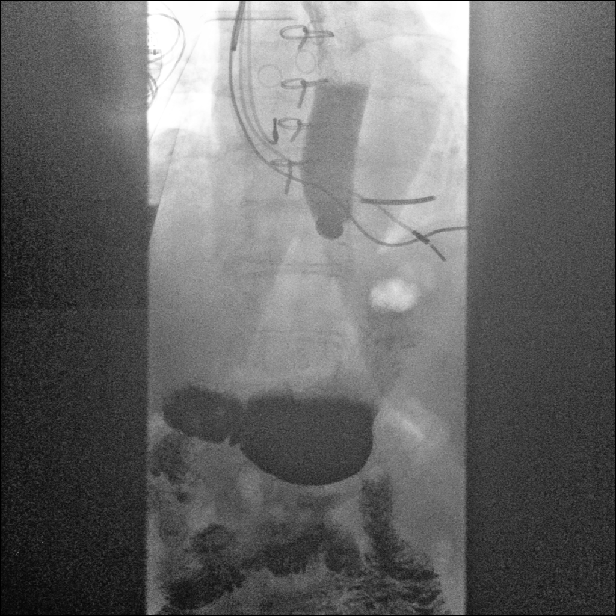
[im 5/5]
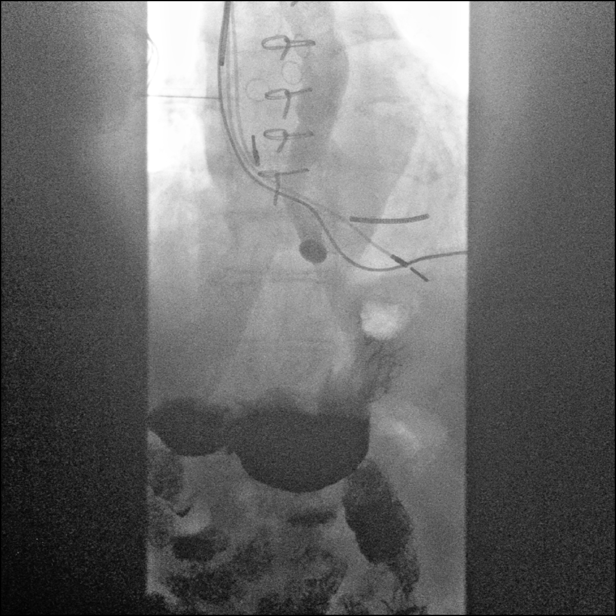

[14 of 24 positions shown; findings below may reference images not displayed]

FINDINGS: The patient swallowed the barium without difficulty. There is mild
presbyesophagus with a good primary stripping wave. Rapid sequence
imaging of the pharynx in the AP and lateral projections
demonstrates no mucosal abnormalities.

There is no significant hiatal hernia or focal mucosal abnormality.
However, there is smooth narrowing of the distal esophageal lumen.
This prevented the passage of a 13 mm barium tablet during
approximately 2 mm of intermittent fluoroscopic observation.

Postsurgical changes from previous CABG, pacemaker placement and
cervical fusion noted.
IMPRESSION: 1. Distal esophageal stricture, likely peptic. This prevented the
passage of a 13 mm barium tablet. No focal mucosal lesions
identified.
2. Normal appearance of the cervical esophagus.
3. Mild presbyesophagus.

## 2020-11-01 ENCOUNTER — Inpatient Hospital Stay: Payer: Medicare Other | Admitting: Adult Health

## 2020-11-02 ENCOUNTER — Telehealth: Payer: Self-pay

## 2020-11-02 NOTE — Telephone Encounter (Signed)
Patient calls today to report that her vision is now blurred in both eyes instead of just the left and has been occurring for a few days. She is also having "spells" of severe headaches that tylenol helps but then the headache returns. That has also been occurring for a few days along with some generalized weakness. Nothing else has changed, denies any one sided weakness, new facial droop (left eye is slightly droopy from previous stroke) and her left arm is still a little weaker than the right, but not new. I had her check her BP which is 97/56. Discussed with PA, patient should keep her appt with [place] on 9/27 and report her symptoms to neurology. Informed patient of recommendations and reviewed signs and symptoms of a stroke - patient verbalizes understanding.

## 2020-11-06 ENCOUNTER — Encounter (HOSPITAL_COMMUNITY): Payer: Medicare Other

## 2020-11-06 DIAGNOSIS — I69354 Hemiplegia and hemiparesis following cerebral infarction affecting left non-dominant side: Secondary | ICD-10-CM | POA: Diagnosis not present

## 2020-11-06 DIAGNOSIS — I5032 Chronic diastolic (congestive) heart failure: Secondary | ICD-10-CM | POA: Diagnosis not present

## 2020-11-06 DIAGNOSIS — J9611 Chronic respiratory failure with hypoxia: Secondary | ICD-10-CM | POA: Diagnosis not present

## 2020-11-06 DIAGNOSIS — I130 Hypertensive heart and chronic kidney disease with heart failure and stage 1 through stage 4 chronic kidney disease, or unspecified chronic kidney disease: Secondary | ICD-10-CM | POA: Diagnosis not present

## 2020-11-06 DIAGNOSIS — I5022 Chronic systolic (congestive) heart failure: Secondary | ICD-10-CM | POA: Diagnosis not present

## 2020-11-06 DIAGNOSIS — I6521 Occlusion and stenosis of right carotid artery: Secondary | ICD-10-CM | POA: Diagnosis not present

## 2020-11-06 DIAGNOSIS — Z9981 Dependence on supplemental oxygen: Secondary | ICD-10-CM | POA: Diagnosis not present

## 2020-11-06 DIAGNOSIS — N1831 Chronic kidney disease, stage 3a: Secondary | ICD-10-CM | POA: Diagnosis not present

## 2020-11-06 DIAGNOSIS — I720 Aneurysm of carotid artery: Secondary | ICD-10-CM | POA: Diagnosis not present

## 2020-11-06 DIAGNOSIS — I2510 Atherosclerotic heart disease of native coronary artery without angina pectoris: Secondary | ICD-10-CM | POA: Diagnosis not present

## 2020-11-06 DIAGNOSIS — J9612 Chronic respiratory failure with hypercapnia: Secondary | ICD-10-CM | POA: Diagnosis not present

## 2020-11-06 DIAGNOSIS — E119 Type 2 diabetes mellitus without complications: Secondary | ICD-10-CM | POA: Diagnosis not present

## 2020-11-08 ENCOUNTER — Encounter: Payer: Self-pay | Admitting: *Deleted

## 2020-11-08 NOTE — Progress Notes (Signed)
Pt came to office thinking she had an appt. Her appt was 2 days ago, which she missed. I informed the patient of this and she was upset, saying that she was told today. She asked if she could be seen today. There were no more openings in our schedule today. I offered to reschedule her for another day. She said "no, im just not going to come back." And left the building.

## 2020-11-19 DIAGNOSIS — R0989 Other specified symptoms and signs involving the circulatory and respiratory systems: Secondary | ICD-10-CM | POA: Diagnosis not present

## 2020-11-19 DIAGNOSIS — I5032 Chronic diastolic (congestive) heart failure: Secondary | ICD-10-CM | POA: Diagnosis not present

## 2020-11-19 DIAGNOSIS — J4520 Mild intermittent asthma, uncomplicated: Secondary | ICD-10-CM | POA: Diagnosis not present

## 2020-11-19 DIAGNOSIS — N184 Chronic kidney disease, stage 4 (severe): Secondary | ICD-10-CM | POA: Diagnosis not present

## 2020-11-20 ENCOUNTER — Encounter (HOSPITAL_COMMUNITY): Payer: Medicare Other

## 2020-11-21 ENCOUNTER — Telehealth: Payer: Self-pay | Admitting: Gastroenterology

## 2020-11-21 NOTE — Telephone Encounter (Signed)
Inbound call from patient requesting a call from a nurse please.  Has questions in regards to her throat but did not specify further.

## 2020-11-22 ENCOUNTER — Other Ambulatory Visit: Payer: Self-pay

## 2020-11-22 DIAGNOSIS — R1310 Dysphagia, unspecified: Secondary | ICD-10-CM

## 2020-11-22 DIAGNOSIS — Z8673 Personal history of transient ischemic attack (TIA), and cerebral infarction without residual deficits: Secondary | ICD-10-CM

## 2020-11-22 NOTE — Telephone Encounter (Signed)
Patient is aware of this. Follow up is scheduled for 12/05/20

## 2020-11-22 NOTE — Telephone Encounter (Signed)
Orders entered. Tried to call the patient. No answer. Will try again this afternoon.

## 2020-11-22 NOTE — Telephone Encounter (Signed)
Please schedule barium esophagogram and modified barium swallow. Follow up office visit next available appt with me or APP. Thanks

## 2020-11-22 NOTE — Telephone Encounter (Signed)
The patient calls with questions about dealing with her dysphagia. She reports episodes of having to regurgitate her food despite small bites. She tries to only eat moistened foods and follow with sips of fluid. Yesterday she tried to eat fish and had to regurgitate the bite because it would not go down. She reports burping when drinking fluids. She is working on weight loss as well.Since she was last seen here in office , she has had a stroke and a carotid endarterectomy. Her appointment for her final follow up after that surgery has been postponed due to [place] in her home.  Please advise.

## 2020-11-29 ENCOUNTER — Other Ambulatory Visit: Payer: Self-pay | Admitting: Cardiovascular Disease

## 2020-12-03 ENCOUNTER — Other Ambulatory Visit (HOSPITAL_COMMUNITY): Payer: Self-pay

## 2020-12-03 DIAGNOSIS — R1310 Dysphagia, unspecified: Secondary | ICD-10-CM

## 2020-12-05 ENCOUNTER — Ambulatory Visit (INDEPENDENT_AMBULATORY_CARE_PROVIDER_SITE_OTHER): Payer: Medicare Other | Admitting: Gastroenterology

## 2020-12-05 ENCOUNTER — Ambulatory Visit (HOSPITAL_COMMUNITY)
Admission: RE | Admit: 2020-12-05 | Discharge: 2020-12-05 | Disposition: A | Discharge status: Home / Self Care | Payer: Medicare Other | Source: Ambulatory Visit | Attending: Gastroenterology | Admitting: Gastroenterology

## 2020-12-05 ENCOUNTER — Other Ambulatory Visit: Payer: Self-pay

## 2020-12-05 ENCOUNTER — Encounter: Payer: Self-pay | Admitting: Gastroenterology

## 2020-12-05 VITALS — BP 118/70 | HR 65 | Ht 63.0 in | Wt 229.0 lb

## 2020-12-05 DIAGNOSIS — Z8673 Personal history of transient ischemic attack (TIA), and cerebral infarction without residual deficits: Secondary | ICD-10-CM | POA: Insufficient documentation

## 2020-12-05 DIAGNOSIS — K224 Dyskinesia of esophagus: Secondary | ICD-10-CM | POA: Diagnosis not present

## 2020-12-05 DIAGNOSIS — R1310 Dysphagia, unspecified: Secondary | ICD-10-CM

## 2020-12-05 DIAGNOSIS — R1013 Epigastric pain: Secondary | ICD-10-CM

## 2020-12-05 MED ORDER — SUCRALFATE 1 G PO TABS: 1.0000 g | ORAL_TABLET | Freq: Three times a day (TID) | ORAL | 3 refills | Status: DC

## 2020-12-05 NOTE — Progress Notes (Signed)
[name]    [id number]    12-07-1945  Primary Care Physician:[name], [name], MD  Referring Physician: No referring provider defined for this encounter.   Chief complaint:  Dysphagia  HPI:  75 year old female with multiple comorbidities, hypertension, diabetes, hyperlipidemia, obstructive sleep apnea, CKD stage III, morbid obesity, CAD s/p CABG 2016, congestive heart failure on home oxygen.  Chronic antiplatelet therapy with Plavix and aspirin. She is s/p biventricular AICD. Patient was recently hospitalized in August 2022 with acute CVA, facial droop, was noted to have high-grade stenosis of right ICA, underwent stent placement  She had repeat Barium esophagogram and modified barium swallow earlier this AM, full report not available in epic I personally reviewed the images with no evidence of high-grade stricture or mass lesion   She continues to have difficulty swallowing, she is choking with most dense breads or tough meats meats, she has no trouble with thinly sliced bread fish or chicken.  Denies any regurgitation or vomiting.  She has epigastric and retrosternal discomfort, somewhat better with Protonix   She has intermittent constipation, takes MiraLAX 2 capfuls as needed with subsequent diarrhea.  She feels tired after bowel movement Denies any melena, rectal bleeding or vomiting   Barium esophagram November 04, 2019: Mild press by esophagus with good primary peristaltic wave.  Smooth narrowing of distal esophagus preventing passage of 13 mm tablet   EGD and colonoscopy in November 2019.  EGD revealed grade B esophagitis, small hiatal hernia, gastritis, and a small nodule in the stomach.  Gastric biopsies showed no H. pylori.  Showed reactive gastropathy with ulceration.  Colonoscopy revealed polyps that were removed and were tubular adenomas.  Also had diverticulosis and internal/external hemorrhoids.   EGD December 14, 2006 showed mild antral gastritis  otherwise normal exam.  H. pylori CLOtest negative.   Colonoscopy September 27, 2002 by Dr. Olevia Perches with removal of small diminutive hyperplastic polyps and left-sided diverticulosis   Outpatient Encounter Medications as of 12/05/2020  Medication Sig   ACCU-CHEK AVIVA PLUS test strip    Accu-Chek Softclix Lancets lancets    acetaminophen (TYLENOL) 500 MG tablet Take 1,000 mg by mouth 2 (two) times daily as needed (for pain or headaches).   albuterol (PROVENTIL HFA;VENTOLIN HFA) 108 (90 Base) MCG/ACT inhaler Inhale 2 puffs into the lungs every 6 (six) hours as needed for wheezing or shortness of breath.   albuterol (PROVENTIL) (2.5 MG/3ML) 0.083% nebulizer solution Take 3 mLs (2.5 mg total) by nebulization every 6 (six) hours as needed for wheezing or shortness of breath.   Alcohol Swabs (B-D SINGLE USE SWABS REGULAR) PADS    allopurinol (ZYLOPRIM) 100 MG tablet TAKE 1 TABLET BY MOUTH  DAILY   aspirin EC 81 MG tablet Take 1 tablet (81 mg total) by mouth daily.   budesonide-formoterol (SYMBICORT) 160-4.5 MCG/ACT inhaler Inhale 2 puffs into the lungs 2 (two) times daily.   carboxymethylcellulose (REFRESH PLUS) 0.5 % SOLN Place 1 drop into both eyes 3 (three) times daily.   clopidogrel (PLAVIX) 75 MG tablet Take 1 tablet (75 mg total) by mouth daily.   cycloSPORINE (RESTASIS) 0.05 % ophthalmic emulsion Place 1 drop into both eyes every 12 (twelve) hours.   empagliflozin (JARDIANCE) 10 MG TABS tablet Take 10 mg by mouth daily before breakfast.   EPINEPHrine 0.3 mg/0.3 mL IJ SOAJ injection Inject 0.3 mg into the muscle once as needed (severe allergic reaction).   ferrous sulfate 325 (65 FE) MG  tablet Take 325 mg by mouth daily with breakfast.   fluticasone (FLONASE) 50 MCG/ACT nasal spray Place 1 spray into both nostrils daily.   ipratropium (ATROVENT) 0.02 % nebulizer solution Take 2.5 mLs (0.5 mg total) by nebulization 4 (four) times daily.   isosorbide mononitrate (IMDUR) 30 MG 24 hr tablet Take 3  tablets (90 mg total) by mouth daily.   loratadine (CLARITIN) 10 MG tablet Take 10 mg by mouth daily.   Multiple Vitamins-Minerals (HAIR/SKIN/NAILS/BIOTIN) TABS Take 1 tablet by mouth every morning.   nitroGLYCERIN (NITROSTAT) 0.4 MG SL tablet Place 1 tablet (0.4 mg total) under the tongue every 5 (five) minutes as needed for chest pain.   nystatin cream (MYCOSTATIN) Apply 1 application topically 2 (two) times daily as needed (rash).   oxyCODONE-acetaminophen (PERCOCET/ROXICET) 5-325 MG tablet Take 1-2 tablets by mouth every 6 (six) hours as needed for moderate pain.   OXYGEN Inhale 3 L/min into the lungs continuous.    pantoprazole (PROTONIX) 40 MG tablet Take 1 tablet (40 mg total) by mouth 2 (two) times daily.   polyethylene glycol (MIRALAX / GLYCOLAX) 17 g packet Take 17 g by mouth daily.   potassium chloride SA (KLOR-CON) 20 MEQ tablet Take 20 mEq by mouth every morning.   predniSONE (DELTASONE) 10 MG tablet Take 10 mg by mouth daily as needed (as directed for gout flares).    ranolazine (RANEXA) 1000 MG SR tablet TAKE 1 TABLET BY MOUTH  TWICE DAILY   rosuvastatin (CRESTOR) 40 MG tablet Take 1 tablet (40 mg total) by mouth every evening. NEED OV.   torsemide (DEMADEX) 20 MG tablet Take 1 tablet (20 mg total) by mouth daily.   No facility-administered encounter medications on file as of 12/05/2020.    Allergies as of 12/05/2020 - Review Complete 10/01/2020  Allergen Reaction Noted   Ivp dye [iodinated diagnostic agents] Shortness Of Breath 10/13/2010   Shellfish allergy Anaphylaxis 11/20/2017   Sulfa antibiotics Shortness Of Breath    Iodine Hives 10/13/2010   Atorvastatin Other (See Comments) 11/12/2015   Benadryl [diphenhydramine] Other (See Comments) 05/31/2019   Colchicine Nausea And Vomiting 12/20/2019   Contrast media [iodinated diagnostic agents] Hives 04/01/2020   Doxycycline Other (See Comments) 04/18/2017   Uloric [febuxostat] Other (See Comments) 04/01/2020   Cephalexin  Itching and Rash 04/30/2015   Zithromax [azithromycin] Rash 06/25/2014    Past Medical History:  Diagnosis Date   ABDOMINAL PAIN -GENERALIZED 02/21/2010   Qualifier: Diagnosis of  By: Chester Holstein NP, Nevin Bloodgood     AICD (automatic cardioverter/defibrillator) present    downgraded to CRT-P in 2014   Anemia    Arthritis    Asthma    Atrial fibrillation ([place]) 10/24/2015   Questionable history of A Fib/A Flutter. On device interrogation on 9/7, showed 0.64min of A Fib/A Flutter with 1% burden.  Device check in Jan 2018 showed no sustained AF (runs of less than 30 seconds). No anticoagulation indicated.   CAD (coronary artery disease)    a. 10/09/16 LHC: SVG->LAD patent, SVG->Diag patent, SVG->RCA patent, SVG->LCx occluded. EF 60%, b. 10/31/16 LHC DES to AV groove Circ, DES to intermed branch   Cellulitis and abscess of foot 12/2014   RT FOOT   CHF (congestive heart failure) (HCC)    Chronic bronchitis (HCC)    Chronic lower back pain    Complete heart block ([place]) 06/22/2015   Diverticulosis    Facial numbness 02/2016   Fatty liver disease, nonalcoholic    Gastritis    GERD (  gastroesophageal reflux disease)    Gout    H/O hiatal hernia    HEMORRHOIDS-EXTERNAL 02/21/2010   Qualifier: Diagnosis of  By: Chester Holstein NP, Paula     High cholesterol    Hyperplastic colon polyp    Hypertension    IBS (irritable bowel syndrome)    Internal hemorrhoids    INTERNAL HEMORRHOIDS WITHOUT MENTION COMP 04/12/2007   Qualifier: Diagnosis of  By: Olevia Perches MD, Dora M    Nonischemic cardiomyopathy ([place]) 11/22/2011   Pt responded to BiV ICD- last EF 55-60% Sept 2017   Obesity    On home oxygen therapy    "2L at night" (10/31/2016)   OSA (obstructive sleep apnea)    "can't tolerate a mask" (10/30/2016)   PERSONAL HX COLONIC POLYPS 02/21/2010   Qualifier: Diagnosis of  By: Chester Holstein NP, Paula     Pneumonia    "couple times" (10/31/2016)   Presence of permanent cardiac pacemaker    11/02/12 [employer] V273  INTUA PPM   Right facial numbness 03/05/2016   Shortness of breath    TIA (transient ischemic attack)    "recently" (10/31/2016)   Type II diabetes mellitus ([place])     Past Surgical History:  Procedure Laterality Date   ABDOMINAL ULTRASOUND  12/01/2011   Peripelvic cysts- #1- 2.4x1.9x2.3cm, #2-1.2x0.9x1.2cm   ANKLE FRACTURE SURGERY Right    "had rod put in"   ANTERIOR CERVICAL DECOMP/DISCECTOMY FUSION     APPENDECTOMY     BACK SURGERY     BIOPSY  12/29/2017   Procedure: BIOPSY;  Surgeon: [name], MD;  Location: [facility];  Service: Endoscopy;;   BIV ICD INSERTION CRT-D  2001?   BIV PACEMAKER GENERATOR CHANGE OUT N/A 11/02/2012   Procedure: BIV PACEMAKER GENERATOR CHANGE OUT;  Surgeon: Sanda [place], MD;  Location: [place] [facility];  Service: Cardiovascular;  Laterality: N/A;   CARDIAC CATHETERIZATION  05/17/1999   No significant coronary obstructive disease w/ mild 20% luminal irregularity of the first diag branch of the LAD   CARDIAC CATHETERIZATION  07/08/2002   No significant CAD, moderately depressed LV systolic function   CARDIAC CATHETERIZATION Bilateral 04/26/2007   Normal findings, recommend medical therapy   CARDIAC CATHETERIZATION  02/18/2008   Moderate CAD, would benefit from having a functional study, recommend continue medical therapy   CARDIAC CATHETERIZATION  07/23/2012   Medical therapy   CARDIAC CATHETERIZATION N/A 11/24/2014   Procedure: Left Heart Cath and Coronary Angiography;  Surgeon: Troy Sine, MD;  Location: [place] [facility];  Service: Cardiovascular;  Laterality: N/A;   CARDIAC CATHETERIZATION  11/27/2014   Procedure: Intravascular Pressure Wire/FFR Study;  Surgeon: Peter M Martinique, MD;  Location: [place] [facility];  Service: Cardiovascular;;   CARDIAC CATHETERIZATION  10/09/2016   CHOLECYSTECTOMY N/A 04/09/2018   Procedure: LAPAROSCOPIC CHOLECYSTECTOMY;  Surgeon: Greer Pickerel, MD;  [facility]: [facility];  Service: General;  Laterality: N/A;    COLONOSCOPY WITH PROPOFOL N/A 12/29/2017   Procedure: COLONOSCOPY WITH PROPOFOL;  Surgeon: [name], MD;  [facility];  Service: Endoscopy;  Laterality: N/A;   CORONARY ANGIOGRAM  2010   CORONARY ARTERY BYPASS GRAFT N/A 11/29/2014   Procedure: CORONARY ARTERY BYPASS GRAFTING x 5 (LIMA-LAD, SVG-D, SVG-OM1-OM2, SVG-PD);  Surgeon: Melrose Nakayama, MD;  Location: [facility];  Service: Open Heart Surgery;  Laterality: N/A;   CORONARY STENT INTERVENTION N/A 10/31/2016   Procedure: CORONARY STENT INTERVENTION;  Surgeon: Burnell Blanks, MD;  Location: [place] [facility] LAB;  Service:  Cardiovascular;  Laterality: N/A;   ESOPHAGOGASTRODUODENOSCOPY (EGD) WITH PROPOFOL N/A 12/29/2017   Procedure: ESOPHAGOGASTRODUODENOSCOPY (EGD) WITH PROPOFOL;  Surgeon: [name], MD;  [facility];  Service: Endoscopy;  Laterality: N/A;   FRACTURE SURGERY     INSERT / REPLACE / [place] ARTHROSCOPY Bilateral    LEFT HEART CATH AND CORS/GRAFTS ANGIOGRAPHY N/A 12/09/2017   Procedure: LEFT HEART CATH AND CORS/GRAFTS ANGIOGRAPHY;  Surgeon: Troy Sine, MD;  Location: [place] [facility];  Service: Cardiovascular;  Laterality: N/A;   LEFT HEART CATHETERIZATION WITH CORONARY ANGIOGRAM N/A 07/23/2012   Procedure: LEFT HEART CATHETERIZATION WITH CORONARY ANGIOGRAM;  Surgeon: Leonie Man, MD;  Location: [facility] C[facility] LAB;  Service: Cardiovascular;  Laterality: N/A;   LEXISCAN MYOVIEW  11/14/2011   Mild-moderate defect seen in Mid Inferolateral and Mid Anterolateral regions-consistant w/ infarct/scar. No significant ischemia demonstrated.   POLYPECTOMY  12/29/2017   Procedure: POLYPECTOMY;  Surgeon: [name], MD;  [facility];  Service: Endoscopy;;   RIGHT/LEFT HEART CATH AND CORONARY ANGIOGRAPHY N/A 10/09/2016   Procedure: RIGHT/LEFT HEART CATH AND CORONARY ANGIOGRAPHY;  Surgeon: Jolaine Artist, MD;  Location: [place] [facility];  Service:  Cardiovascular;  Laterality: N/A;   RIGHT/LEFT HEART CATH AND CORONARY/GRAFT ANGIOGRAPHY N/A 03/11/2019   Procedure: RIGHT/LEFT HEART CATH AND CORONARY/GRAFT ANGIOGRAPHY;  Surgeon: Jolaine Artist, MD;  Location: [place] [facility];  Service: Cardiovascular;  Laterality: N/A;   TEE WITHOUT CARDIOVERSION N/A 11/29/2014   Procedure: TRANSESOPHAGEAL ECHOCARDIOGRAM (TEE);  Surgeon: Melrose Nakayama, MD;  Location: [place];  Service: Open Heart Surgery;  Laterality: N/A;   TRANSCAROTID ARTERY REVASCULARIZATION  Right 10/01/2020   Procedure: RIGHT TRANSCAROTID ARTERY REVASCULARIZATION;  Surgeon: [name], MD;  Location: [facility] OR;  Service: Vascular;  Laterality: Right;   TRANSTHORACIC ECHOCARDIOGRAM  07/23/2012   EF 55-60%, normal-mild   TUBAL LIGATION     ULTRASOUND GUIDANCE FOR VASCULAR ACCESS Left 10/01/2020   Procedure: ULTRASOUND GUIDANCE FOR VASCULAR ACCESS;  Surgeon: [name], MD;  Location: [place];  Service: Vascular;  Laterality: Left;    Family History  Problem Relation Age of Onset   Breast cancer Mother    Diabetes Mother    Heart disease Maternal Grandmother    Kidney disease Maternal Grandmother    Diabetes Maternal Grandmother    Glaucoma Maternal Aunt    Heart disease Maternal Aunt    Asthma Sister    Colon cancer Neg Hx    Stomach cancer Neg Hx    Pancreatic cancer Neg Hx     Social History   Socioeconomic History   Marital status: Widowed    Spouse name: Not on file   Number of children: 5   Years of education: Not on file   Highest education level: Not on file  Occupational History   Occupation: STAFF/BUFFET    Employer: [employer]  Tobacco Use   Smoking status: Former    Packs/day: 0.25    Years: 3.00    Pack years: 0.75    Types: Cigarettes    Quit date: 02/11/1968    Years since quitting: 52.8   Smokeless tobacco: Never  Vaping Use   Vaping Use: Never used  Substance and Sexual Activity   Alcohol use: No   Drug  use: No   Sexual activity: Never  Other Topics Concern   Not on file  Social History Narrative   Widowed last year. She lives with  her two sons.   Social Determinants of Health   Financial Resource Strain: Not on file  Food Insecurity: Not on file  Transportation Needs: Not on file  Physical Activity: Not on file  Stress: Not on file  Social Connections: Not on file  Intimate Partner Violence: Not on file      Review of systems: All other review of systems negative except as mentioned in the HPI.   Physical Exam: Vitals:   12/05/20 1104  BP: 118/70  Pulse: 65   Body mass index is 40.57 kg/m. Gen:      No acute distress, in wheelchair on oxygen HEENT:  sclera anicteric Abd:      soft, non-tender; no palpable masses, no distension Ext:    No edema Neuro: alert and oriented x 3 Psych: normal mood and affect  Data Reviewed:  Reviewed labs, radiology imaging, old records and pertinent past GI work up   Assessment and Plan/Recommendations:  75 year old very pleasant female with multiple comorbidities, CABG, CAD on Plavix, CHF s/p biventricular AICD on continuous home O2, recent hospitalization with acute CVA secondary to high-grade ICA stenosis s/p stent placement August 2022   Barium esophagram with no high risk lesions or high-grade stricture in the esophagus on preliminary review of images, await full report  Will hold off EGD at this point given patient is able to manage her symptoms with dietary modification, she is at potential significant risk from anesthesia and procedure given multiple comorbidities and recent acute CVA    GERD: Continue Protonix 40 mg twice daily and antireflux measures Epigastric abdominal discomfort: Add Carafate 1 g before meals as needed     Chronic idiopathic constipation: Continue MiraLAX daily with goal of 1-2 soft bowel movements daily Start taking Benefiber 1 tablespoon 2-3 times daily with meals   Continue with dietary  modifications and exercise for weight loss  History of colon polyps, no recall due to age    Return as needed  This visit required 40 minutes of patient care (this includes precharting, chart review, review of results, face-to-face time used for counseling as well as treatment plan and follow-up. The patient was provided an opportunity to ask questions and all were answered. The patient agreed with the plan and demonstrated an understanding of the instructions.  Damaris Hippo , MD    CC: No ref. provider found

## 2020-12-05 NOTE — Patient Instructions (Signed)
We have sent your prescription of Carafate to your pharmacy  Follow up as needed  If you are age 76 or older, your body mass index should be between 23-30. Your Body mass index is 40.57 kg/m. If this is out of the aforementioned range listed, please consider follow up with your Primary Care Provider.  If you are age 41 or younger, your body mass index should be between 19-25. Your Body mass index is 40.57 kg/m. If this is out of the aformentioned range listed, please consider follow up with your Primary Care Provider.   ________________________________________________________  The [facility] providers would like to encourage you to use [facility] to communicate with providers for non-urgent requests or questions.  Due to long hold times on the telephone, sending your provider a message by [facility] may be a faster and more efficient way to get a response.  Please allow 48 business hours for a response.  Please remember that this is for non-urgent requests.  _______________________________________________________   I appreciate the  opportunity to care for you  Thank You   Harl Bowie , MD

## 2020-12-06 DIAGNOSIS — I5022 Chronic systolic (congestive) heart failure: Secondary | ICD-10-CM | POA: Diagnosis not present

## 2020-12-06 DIAGNOSIS — J9612 Chronic respiratory failure with hypercapnia: Secondary | ICD-10-CM | POA: Diagnosis not present

## 2020-12-06 DIAGNOSIS — J9611 Chronic respiratory failure with hypoxia: Secondary | ICD-10-CM | POA: Diagnosis not present

## 2020-12-09 DIAGNOSIS — L03115 Cellulitis of right lower limb: Secondary | ICD-10-CM | POA: Diagnosis not present

## 2020-12-09 DIAGNOSIS — R0602 Shortness of breath: Secondary | ICD-10-CM | POA: Diagnosis not present

## 2020-12-10 ENCOUNTER — Telehealth: Payer: Self-pay | Admitting: Cardiovascular Disease

## 2020-12-10 NOTE — Telephone Encounter (Signed)
Spoke to patient she stated she has been having sob,weakness, low B/P.Stated she went to Urgent Care yesterday.CXR normal.She has not received lab results.Stated she would like appointment with Dr.Croitoru.Advised Dr.Croitoru does not have any openings until 11/29.Stated she wants to be seen sooner.She refused to see a PA .Advised I will send message to Dr.Croitoru's RN.

## 2020-12-10 NOTE — Telephone Encounter (Signed)
Called the patient. Appointment made for 11/1 at 10:20 with Dr. [name]

## 2020-12-10 NOTE — Telephone Encounter (Signed)
New Message:     Patient said she had to go to [facility] [address] on Sunday(12-09-20) She said she was short of breath, felt like she was going to pass out and blood pressure was real. The doctor at Urgent Care told her to contact Dr C and let him know what was going on and see what he wanted to do.

## 2020-12-11 ENCOUNTER — Inpatient Hospital Stay: Payer: Medicare Other | Admitting: Adult Health

## 2020-12-11 ENCOUNTER — Other Ambulatory Visit: Payer: Self-pay

## 2020-12-11 ENCOUNTER — Ambulatory Visit (INDEPENDENT_AMBULATORY_CARE_PROVIDER_SITE_OTHER): Payer: Medicare Other | Admitting: Cardiovascular Disease

## 2020-12-11 ENCOUNTER — Encounter: Payer: Self-pay | Admitting: Cardiovascular Disease

## 2020-12-11 VITALS — BP 108/60 | HR 70 | Ht 63.0 in | Wt 227.0 lb

## 2020-12-11 DIAGNOSIS — I442 Atrioventricular block, complete: Secondary | ICD-10-CM

## 2020-12-11 DIAGNOSIS — Z01812 Encounter for preprocedural laboratory examination: Secondary | ICD-10-CM | POA: Diagnosis not present

## 2020-12-11 DIAGNOSIS — T82111D Breakdown (mechanical) of cardiac pulse generator (battery), subsequent encounter: Secondary | ICD-10-CM | POA: Diagnosis not present

## 2020-12-11 DIAGNOSIS — E1122 Type 2 diabetes mellitus with diabetic chronic kidney disease: Secondary | ICD-10-CM | POA: Diagnosis not present

## 2020-12-11 DIAGNOSIS — E662 Morbid (severe) obesity with alveolar hypoventilation: Secondary | ICD-10-CM

## 2020-12-11 DIAGNOSIS — E782 Mixed hyperlipidemia: Secondary | ICD-10-CM | POA: Diagnosis not present

## 2020-12-11 DIAGNOSIS — Z01818 Encounter for other preprocedural examination: Secondary | ICD-10-CM | POA: Diagnosis not present

## 2020-12-11 DIAGNOSIS — I5033 Acute on chronic diastolic (congestive) heart failure: Secondary | ICD-10-CM

## 2020-12-11 DIAGNOSIS — I25708 Atherosclerosis of coronary artery bypass graft(s), unspecified, with other forms of angina pectoris: Secondary | ICD-10-CM | POA: Diagnosis not present

## 2020-12-11 DIAGNOSIS — N1832 Chronic kidney disease, stage 3b: Secondary | ICD-10-CM | POA: Diagnosis not present

## 2020-12-11 NOTE — Progress Notes (Signed)
Patient ID: [name], female   DOB: Mar 26, 1945, 75 y.o.   MRN: [id number] .    Cardiology Office Note    Date:  12/11/2020   ID:  [name], DOB Mar 15, 1945, MRN [id number]  PCP:  Lujean Amel, MD  Cardiologist:   Sanda Klein, MD   Chief Complaint  Patient presents with   Follow-up   Shortness of Breath   Hypotension     History of Present Illness:  [name] is a 75 y.o. female with CAD s/p CABG October 2016 (LIMA to LAD, SVG to D1, SVG to OM1 and OM2, SVG to PDA), diastolic heart failure, morbid obesity, adrenal insufficiency, type 2 diabetes mellitus, Hypertension, stage III chronic kidney disease, gout, obstructive sleep apnea, asthma. Nonischemic cardiomyopathy and heart failure preceded the diagnosis of coronary disease. She has complete heart block and had a conventional pacemaker implanted in 2001. This was upgraded to a biventricular ICD implanted in 2008 for severe cardiomyopathy with ejection fraction 20%, downgraded to CRT-P in 2014 ([place]) following excellent response to resynchronization (hyper-responder, normalization of left ventricular systolic function). The current atrial and right ventricular leads were implanted in 2001, the left ventricular lead was implanted in 2008. The defibrillator lead implanted in 2008 is capped and its tip was resected at the time of bypass surgery. Ever since bypass surgery, atrial pacing thresholds have been extremely high, so her device is programmed VDD.   A [employer] device subjective recall due to potential sudden battery drain during remote transmissions.  Therefore she has been getting in office checks only.  She had a TIA (left hemiparesis and facial droop 09/16/2020)  and received a right internal carotid stent (TCAR) by Dr. Monica Martinez on August 22. Her AICD was reportedly interrogated at that time. Echo 08/08 showed LVEF 55-60%, E/A was <1, but E/e' was elevated.   About 2 weeks  ago she complained of congestion and was prescribed a steroid Dosepak.  She has been having problems with severe dyspnea at rest, dizziness, weakness, blurry vision and very low blood pressure in the 80s/50s.  She has not had full-blown syncope, but has come close.  She went to [facility] urgent care on [address] on Sunday and had a chest x-ray (reportedly clear) and labs (reportedly showed a high BNP).  Unfortunately cannot review the data from that visit.  She is compliant with Jardiance and torsemide.  She has had occasional episodes of chest pain and has taken 4 or 5 nitroglycerin tablets over the last couple of months.  She has a sore on the back of her right calf that is tender.  She was prescribed an antibiotic cream but no systemic antibiotics.  She has not had fever or chills.  She's lost 37 pounds through dieting over the last year or so.  Her dry weight is uncertain at this point.  She weighs 9 pounds less now than she did at the end of August when she was hospitalized with her stroke.  On presentation today her ECG showed left atrial sensed, ventricular paced rhythm with a very broad paced QRS at 182 ms and a QS pattern in lead V1, consistent with RV apical pacing alone.  Only one of the paced beats suggested some degree of resynchronization.  After increasing the output on her LV lead, the EKG shows a much narrower QRS complex at 124 ms duration, with a distinct positive R wave in lead V1.  Had symptoms of heart failure again in  the fall of 2020 and echocardiogram 01/18/2019 showed EF had deteriorated, down to 40-45% (had been normal in 2017) and left atrial pressures were increased.  On 12/17 she was seen in the clinic and it was noted that she had lost left ventricular capture.  The output was increased in the coronary sinus lead.    Right and left heart catheterization with Dr. Haroldine Laws on 03/11/2019.  Coronary anatomy was unchanged, with known occlusion of the SVG to OM but with other bypasses  patent and with patent stents in the  native left circumflex coronary artery and ramus intermedius.  Left heart filling pressures were perfectly compensated with a wedge pressure of 14, but there was still evidence of mild pulmonary hypertension (PA 34/17, mean 24 mmHg, PVR 1.5, normal cardiac output with an index of 3.1 L/minute/m sq).   Past Medical History:  Diagnosis Date   ABDOMINAL PAIN -GENERALIZED 02/21/2010   Qualifier: Diagnosis of  By: Chester Holstein NP, Nevin Bloodgood     AICD (automatic cardioverter/defibrillator) present    downgraded to CRT-P in 2014   Anemia    Arthritis    Asthma    Atrial fibrillation ([place]) 10/24/2015   Questionable history of A Fib/A Flutter. On device interrogation on 9/7, showed 0.49min of A Fib/A Flutter with 1% burden.  Device check in Jan 2018 showed no sustained AF (runs of less than 30 seconds). No anticoagulation indicated.   CAD (coronary artery disease)    a. 10/09/16 LHC: SVG->LAD patent, SVG->Diag patent, SVG->RCA patent, SVG->LCx occluded. EF 60%, b. 10/31/16 LHC DES to AV groove Circ, DES to intermed branch   Cellulitis and abscess of foot 12/2014   RT FOOT   CHF (congestive heart failure) (HCC)    Chronic bronchitis (HCC)    Chronic lower back pain    Complete heart block ([place]) 06/22/2015   Diverticulosis    Facial numbness 02/2016   Fatty liver disease, nonalcoholic    Gastritis    GERD (gastroesophageal reflux disease)    Gout    H/O hiatal hernia    HEMORRHOIDS-EXTERNAL 02/21/2010   Qualifier: Diagnosis of  By: Chester Holstein NP, Paula     High cholesterol    Hyperplastic colon polyp    Hypertension    IBS (irritable bowel syndrome)    Internal hemorrhoids    INTERNAL HEMORRHOIDS WITHOUT MENTION COMP 04/12/2007   Qualifier: Diagnosis of  By: Olevia Perches MD, Dora M    Nonischemic cardiomyopathy ([place]) 11/22/2011   Pt responded to BiV ICD- last EF 55-60% Sept 2017   Obesity    On home oxygen therapy    "2L at night" (10/31/2016)   OSA (obstructive sleep  apnea)    "can't tolerate a mask" (10/30/2016)   PERSONAL HX COLONIC POLYPS 02/21/2010   Qualifier: Diagnosis of  By: Chester Holstein NP, Paula     Pneumonia    "couple times" (10/31/2016)   Presence of permanent cardiac pacemaker    11/02/12 [employer] V273 INTUA PPM   Right facial numbness 03/05/2016   Shortness of breath    Stroke ([place]) 09/2020   TIA (transient ischemic attack)    "recently" (10/31/2016)   Type II diabetes mellitus ([place])     Past Surgical History:  Procedure Laterality Date   ABDOMINAL ULTRASOUND  12/01/2011   Peripelvic cysts- #1- 2.4x1.9x2.3cm, #2-1.2x0.9x1.2cm   ANKLE FRACTURE SURGERY Right    "had rod put in"   ANTERIOR CERVICAL DECOMP/DISCECTOMY FUSION     APPENDECTOMY     BACK SURGERY  BIOPSY  12/29/2017   Procedure: BIOPSY;  Surgeon: [name], MD;  [facility]: [facility];  Service: Endoscopy;;   BIV ICD INSERTION CRT-D  2001?   BIV PACEMAKER GENERATOR CHANGE OUT N/A 11/02/2012   Procedure: BIV PACEMAKER GENERATOR CHANGE OUT;  Surgeon: Sanda Klein, MD;  Location: [place] [facility];  Service: Cardiovascular;  Laterality: N/A;   CARDIAC CATHETERIZATION  05/17/1999   No significant coronary obstructive disease w/ mild 20% luminal irregularity of the first diag branch of the LAD   CARDIAC CATHETERIZATION  07/08/2002   No significant CAD, moderately depressed LV systolic function   CARDIAC CATHETERIZATION Bilateral 04/26/2007   Normal findings, recommend medical therapy   CARDIAC CATHETERIZATION  02/18/2008   Moderate CAD, would benefit from having a functional study, recommend continue medical therapy   CARDIAC CATHETERIZATION  07/23/2012   Medical therapy   CARDIAC CATHETERIZATION N/A 11/24/2014   Procedure: Left Heart Cath and Coronary Angiography;  Surgeon: Troy Sine, MD;  Location: [place] [facility];  Service: Cardiovascular;  Laterality: N/A;   CARDIAC CATHETERIZATION  11/27/2014   Procedure: Intravascular Pressure Wire/FFR Study;  Surgeon:  Peter M Martinique, MD;  Location: [place] [facility];  Service: Cardiovascular;;   CARDIAC CATHETERIZATION  10/09/2016   CHOLECYSTECTOMY N/A 04/09/2018   Procedure: LAPAROSCOPIC CHOLECYSTECTOMY;  Surgeon: Greer Pickerel, MD;  [facility];  Service: General;  Laterality: N/A;   COLONOSCOPY WITH PROPOFOL N/A 12/29/2017   Procedure: COLONOSCOPY WITH PROPOFOL;  Surgeon: [name], MD;  [facility];  Service: Endoscopy;  Laterality: N/A;   CORONARY ANGIOGRAM  2010   CORONARY ARTERY BYPASS GRAFT N/A 11/29/2014   Procedure: CORONARY ARTERY BYPASS GRAFTING x 5 (LIMA-LAD, SVG-D, SVG-OM1-OM2, SVG-PD);  Surgeon: Melrose Nakayama, MD;  Location: [place];  Service: Open Heart Surgery;  Laterality: N/A;   CORONARY STENT INTERVENTION N/A 10/31/2016   Procedure: CORONARY STENT INTERVENTION;  Surgeon: Burnell Blanks, MD;  Location: [place] CV LAB;  Service: Cardiovascular;  Laterality: N/A;   ESOPHAGOGASTRODUODENOSCOPY (EGD) WITH PROPOFOL N/A 12/29/2017   Procedure: ESOPHAGOGASTRODUODENOSCOPY (EGD) WITH PROPOFOL;  Surgeon: [name], MD;  [facility];  Service: Endoscopy;  Laterality: N/A;   FRACTURE SURGERY     INSERT / REPLACE / [place] ARTHROSCOPY Bilateral    LEFT HEART CATH AND CORS/GRAFTS ANGIOGRAPHY N/A 12/09/2017   Procedure: LEFT HEART CATH AND CORS/GRAFTS ANGIOGRAPHY;  Surgeon: Troy Sine, MD;  Location: [place] [facility];  Service: Cardiovascular;  Laterality: N/A;   LEFT HEART CATHETERIZATION WITH CORONARY ANGIOGRAM N/A 07/23/2012   Procedure: LEFT HEART CATHETERIZATION WITH CORONARY ANGIOGRAM;  Surgeon: Leonie Man, MD;  Location: [facility] CATH LAB;  Service: Cardiovascular;  Laterality: N/A;   LEXISCAN MYOVIEW  11/14/2011   Mild-moderate defect seen in Mid Inferolateral and Mid Anterolateral regions-consistant w/ infarct/scar. No significant ischemia demonstrated.   POLYPECTOMY  12/29/2017   Procedure: POLYPECTOMY;   Surgeon: [name], MD;  [facility];  Service: Endoscopy;;   RIGHT/LEFT HEART CATH AND CORONARY ANGIOGRAPHY N/A 10/09/2016   Procedure: RIGHT/LEFT HEART CATH AND CORONARY ANGIOGRAPHY;  Surgeon: Jolaine Artist, MD;  Location: [place] [facility];  Service: Cardiovascular;  Laterality: N/A;   RIGHT/LEFT HEART CATH AND CORONARY/GRAFT ANGIOGRAPHY N/A 03/11/2019   Procedure: RIGHT/LEFT HEART CATH AND CORONARY/GRAFT ANGIOGRAPHY;  Surgeon: Jolaine Artist, MD;  Location: [place] CV LAB;  Service: Cardiovascular;  Laterality: N/A;   TEE WITHOUT CARDIOVERSION N/A 11/29/2014  Procedure: TRANSESOPHAGEAL ECHOCARDIOGRAM (TEE);  Surgeon: Melrose Nakayama, MD;  Location: [place];  Service: Open Heart Surgery;  Laterality: N/A;   TRANSCAROTID ARTERY REVASCULARIZATION  Right 10/01/2020   Procedure: RIGHT TRANSCAROTID ARTERY REVASCULARIZATION;  Surgeon: [name], MD;  Location: [place];  Service: Vascular;  Laterality: Right;   TRANSTHORACIC ECHOCARDIOGRAM  07/23/2012   EF 55-60%, normal-mild   TUBAL LIGATION     ULTRASOUND GUIDANCE FOR VASCULAR ACCESS Left 10/01/2020   Procedure: ULTRASOUND GUIDANCE FOR VASCULAR ACCESS;  Surgeon: [name], MD;  Location: [place];  Service: Vascular;  Laterality: Left;    Current Medications: Outpatient Medications Prior to Visit  Medication Sig Dispense Refill   ACCU-CHEK AVIVA PLUS test strip      Accu-Chek Softclix Lancets lancets      acetaminophen (TYLENOL) 500 MG tablet Take 1,000 mg by mouth 2 (two) times daily as needed (for pain or headaches).     albuterol (PROVENTIL HFA;VENTOLIN HFA) 108 (90 Base) MCG/ACT inhaler Inhale 2 puffs into the lungs every 6 (six) hours as needed for wheezing or shortness of breath. 1 Inhaler 0   albuterol (PROVENTIL) (2.5 MG/3ML) 0.083% nebulizer solution Take 3 mLs (2.5 mg total) by nebulization every 6 (six) hours as needed for wheezing or shortness of breath. 75 mL 0   Alcohol Swabs  (B-D SINGLE USE SWABS REGULAR) PADS      allopurinol (ZYLOPRIM) 100 MG tablet TAKE 1 TABLET BY MOUTH  DAILY 90 tablet 3   aspirin EC 81 MG tablet Take 1 tablet (81 mg total) by mouth daily. 90 tablet 3   budesonide-formoterol (SYMBICORT) 160-4.5 MCG/ACT inhaler Inhale 2 puffs into the lungs 2 (two) times daily.     carboxymethylcellulose (REFRESH PLUS) 0.5 % SOLN Place 1 drop into both eyes 3 (three) times daily.     clopidogrel (PLAVIX) 75 MG tablet Take 1 tablet (75 mg total) by mouth daily. 90 tablet 1   cycloSPORINE (RESTASIS) 0.05 % ophthalmic emulsion Place 1 drop into both eyes every 12 (twelve) hours.     empagliflozin (JARDIANCE) 10 MG TABS tablet Take 10 mg by mouth daily before breakfast. 90 tablet 3   EPINEPHrine 0.3 mg/0.3 mL IJ SOAJ injection Inject 0.3 mg into the muscle once as needed (severe allergic reaction).     ferrous sulfate 325 (65 FE) MG tablet Take 325 mg by mouth daily with breakfast.     fluticasone (FLONASE) 50 MCG/ACT nasal spray Place 1 spray into both nostrils daily.     ipratropium (ATROVENT) 0.02 % nebulizer solution Take 2.5 mLs (0.5 mg total) by nebulization 4 (four) times daily. 75 mL 0   loratadine (CLARITIN) 10 MG tablet Take 10 mg by mouth daily.     Multiple Vitamins-Minerals (HAIR/SKIN/NAILS/BIOTIN) TABS Take 1 tablet by mouth every morning.     nystatin cream (MYCOSTATIN) Apply 1 application topically 2 (two) times daily as needed (rash).     oxyCODONE-acetaminophen (PERCOCET/ROXICET) 5-325 MG tablet Take 1-2 tablets by mouth every 6 (six) hours as needed for moderate pain. 15 tablet 0   OXYGEN Inhale 3 L/min into the lungs continuous.      pantoprazole (PROTONIX) 40 MG tablet Take 1 tablet (40 mg total) by mouth 2 (two) times daily. 180 tablet 3   polyethylene glycol (MIRALAX / GLYCOLAX) 17 g packet Take 17 g by mouth daily.     potassium chloride SA (KLOR-CON) 20 MEQ tablet Take 20 mEq by mouth every morning.     predniSONE (  DELTASONE) 10 MG tablet  Take 10 mg by mouth daily as needed (as directed for gout flares).      ranolazine (RANEXA) 1000 MG SR tablet TAKE 1 TABLET BY MOUTH  TWICE DAILY 180 tablet 1   rosuvastatin (CRESTOR) 40 MG tablet Take 1 tablet (40 mg total) by mouth every evening. NEED OV. 90 tablet 2   sucralfate (CARAFATE) 1 g tablet Take 1 tablet (1 g total) by mouth in the morning, at noon, and at bedtime. As needed 90 tablet 3   torsemide (DEMADEX) 20 MG tablet Take 1 tablet (20 mg total) by mouth daily. 90 tablet 3   isosorbide mononitrate (IMDUR) 30 MG 24 hr tablet Take 3 tablets (90 mg total) by mouth daily. 270 tablet 1   nitroGLYCERIN (NITROSTAT) 0.4 MG SL tablet Place 1 tablet (0.4 mg total) under the tongue every 5 (five) minutes as needed for chest pain. 25 tablet 3   No facility-administered medications prior to visit.     Allergies:   Ivp dye [iodinated diagnostic agents], Shellfish allergy, Sulfa antibiotics, Iodine, Atorvastatin, Benadryl [diphenhydramine], Colchicine, Contrast media [iodinated diagnostic agents], Doxycycline, Uloric [febuxostat], Cephalexin, and Zithromax [azithromycin]   Social History   Socioeconomic History   Marital status: Widowed    Spouse name: Not on file   Number of children: 5   Years of education: Not on file   Highest education level: Not on file  Occupational History   Occupation: STAFF/BUFFET    Employer: [employer]  Tobacco Use   Smoking status: Former    Packs/day: 0.25    Years: 3.00    Pack years: 0.75    Types: Cigarettes    Quit date: 02/11/1968    Years since quitting: 52.8   Smokeless tobacco: Never  Vaping Use   Vaping Use: Never used  Substance and Sexual Activity   Alcohol use: No   Drug use: No   Sexual activity: Never  Other Topics Concern   Not on file  Social History Narrative   Widowed last year. She lives with her two sons.   Social Determinants of Health   Financial Resource Strain: Not on file  Food Insecurity: Not on file   Transportation Needs: Not on file  Physical Activity: Not on file  Stress: Not on file  Social Connections: Not on file     Family History:  The patient's family history includes Asthma in her sister; Breast cancer in her mother; Diabetes in her maternal grandmother and mother; Glaucoma in her maternal aunt; Heart disease in her maternal aunt and maternal grandmother; Kidney disease in her maternal grandmother.   ROS:   Please see the history of present illness.    ROS All other systems are reviewed and are negative.   PHYSICAL EXAM:   VS:  BP 108/60 (BP Location: Right Arm, Patient Position: Sitting, Cuff Size: Large)   Pulse 70   Ht 5\' 3"  (1.6 m)   Wt 227 lb (103 kg)   BMI 40.21 kg/m      General: Alert, oriented x3, no distress, morbidly obese.  The right subclavian pacemaker site appears healthy, but there are I think 5 distinct surgical scars in this area. Head: no evidence of trauma, PERRL, EOMI, no exophtalmos or lid lag, no myxedema, no xanthelasma; normal ears, nose and oropharynx Neck: normal jugular venous pulsations and no hepatojugular reflux; brisk carotid pulses without delay and no carotid bruits Chest: clear to auscultation, no signs of consolidation by percussion or  palpation, normal fremitus, symmetrical and full respiratory excursions Cardiovascular: normal position and quality of the apical impulse, regular rhythm, normal first and paradoxically split second heart sounds, no murmurs, rubs or gallops Abdomen: no tenderness or distention, no masses by palpation, no abnormal pulsatility or arterial bruits, normal bowel sounds, no hepatosplenomegaly Extremities: no clubbing, cyanosis or edema; 2+ radial, ulnar and brachial pulses bilaterally; 2+ right femoral, posterior tibial and dorsalis pedis pulses; 2+ left femoral, posterior tibial and dorsalis pedis pulses; no subclavian or femoral bruits Neurological: grossly nonfocal Psych: Normal mood and affect    Wt  Readings from Last 3 Encounters:  12/11/20 227 lb (103 kg)  12/05/20 229 lb (103.9 kg)  10/01/20 238 lb (108 kg)      Studies/Labs Reviewed:   EKG:  EKG is ordered today.  The initial tracing shows atrial sensed (sinus), ventricular paced rhythm with frequent PVCs and very broad paced QRS complexes at 182 ms.  Only one of the beats appears to have some degree of LV pacing component with a narrow QRS.  LV lead pacing output was increased to 3.5 mV at 2.0 ms and ECG was repeated.  The follow-up ECG shows a much narrower QRS complex at 124 ms with distinct positive R waves in lead V1.  Recent Labs: 09/26/2020: ALT 9 10/02/2020: BUN 26; Creatinine, Ser 1.66; Hemoglobin 11.7; Platelets 165; Potassium 4.2; Sodium 140   Lipid Panel    Component Value Date/Time   CHOL 169 10/02/2020 0415   CHOL 186 01/21/2019 1045   TRIG 124 10/02/2020 0415   HDL 50 10/02/2020 0415   HDL 44 01/21/2019 1045   CHOLHDL 3.4 10/02/2020 0415   VLDL 25 10/02/2020 0415   LDLCALC 94 10/02/2020 0415   LDLCALC 99 01/21/2019 1045   08/18/2018 Total cholesterol 193, triglycerides 187, HDL 52, LDL 103 Hemoglobin A1c 6.9% 11/25/2018  creatinine 2.13  ASSESSMENT:    1. Acute on chronic diastolic heart failure ([address])   2. Coronary artery disease involving coronary bypass graft of native heart with other forms of angina pectoris ([place])   3. CHB (complete heart block) (HCC)   4. Biventricular cardiac pacemaker battery malfunction, subsequent encounter   5. Obesity hypoventilation syndrome ([place])   6. Morbid (severe) obesity due to excess calories ([place])   7. Mixed hypercholesterolemia and hypertriglyceridemia   8. Type 2 diabetes mellitus with stage 3b chronic kidney disease, without long-term current use of insulin (HCC)   9. Stage 3b chronic kidney disease ([place])   10. Pre-procedure lab exam      PLAN:  In order of problems listed above:  CHF: Reportedly her BNP was elevated, but I do not have those results.  She  also has symptomatic hypotension.  The precipitating factor appears to be loss of effective cardiac resynchronization pacing.  She is receiving essentially exclusively RV apical pacing and this has previously led to heart failure decompensation.  LV lead pacing outputs reprogrammed today.  She now appears to be receiving good LV pacing and resynchronization.  We will allow a week or 2 for her to recover from heart failure exacerbation. CAD s/p CABG: She is recently had some angina pectoris, but I suspect this is related to heart failure exacerbation rather than a new coronary stenosis.  When she had her last cardiac catheterization in January 2021, all major coronary territories have adequate coronary flow via bypasses or native vessels with stents.   On lifelong dual antiplatelet therapy.  Receiving statin.  She was on a  highly selective beta-blocker, which she was tolerating well, but this has been stopped.  It appears that it was stopped when she presented with her stroke in August.  We will not restart it now with her low blood pressure, but plan to restart it when she has her pacemaker generator change out.. CHB: She is pacemaker dependent. CRT-P: The current device generator is under advisory and we are unable to perform remote downloads due to the risk of sudden battery drain.  We have been checking in the office only.  This is possibly why we have missed the fact that she was not getting appropriate LV pacing capture.  Discussed with industry.  They recommend changing out the generator, even though there is roughly 1 year of battery remaining.  Device is programmed VDD due to very high atrial pacing thresholds.  She never requires atrial pacing and atrial sensing is still acceptable at approximately 1.0 mV. The current right atrial lead and right ventricular pacing lead were placed in 2001. The current left ventricular lead was placed in 2008.  LV lead pacing thresholds have been relatively high, but  still usable.  Since there are multiple issues with her pacing system, revising the right atrial and left ventricular leads is considered.  However, she has multiple surgical interventions and has numerous comorbidities that put her at extremely high risk for complications.  I think as long as we can still obtain capture with the LV lead we should keep the current pacing system and use.  I do recommend using an Aegis pouch at the time of the generator change out.This procedure has been fully reviewed with the patient and written informed consent has been obtained. OSA/obesity-hypoventilation sd: Note that an ABG performed on 09/26/2020 showed a normal pH of 7.36 with a PCO2 of 62, consistent with severe chronic hypoventilation.  Intolerant of CPAP; pulmonary hypertension due to sleep apnea and obesity hypoventilation syndrome are likely contributing to a large degree to her shortness of breath.  She also has significant reactive airway disease and has periodically required steroid therapy. Morbid obesity: She is making a lot of progress with her new diet and she is to be congratulated.  The only disadvantage is I no longer really know what her "dry weight" might be. HLP: Despite being on a highly active statin, her LDL cholesterol was still well above target at 94.  She is on maximum dose rosuvastatin.  We will plan to add ezetimibe. DM: Control is substantially improved with A1c down to 6.5% and this is reflected in her triglyceride level, which is also now normal. CKD 3b: Most recent creatinine in August was 1.66.  Do not have access to the most recent labs drawn at the urgent care center.  She has a history of gout and we should avoid excessive diuresis, especially with thiazide diuretics.  Etiology of her dyspnea is multifactorial, including morbid obesity and restrictive physiology, asthma, pulmonary hypertension due to obesity and obstructive sleep apnea as well as heart failure    Medication  Adjustments/Labs and Tests Ordered: Current medicines are reviewed at length with the patient today.  Concerns regarding medicines are outlined above.  Medication changes, Labs and Tests ordered today are listed in the Patient Instructions below. Patient Instructions  Medication Instructions: STOP the Isosorbide (Imdur)  Labwork: Pre procedure lab work today: BMET & CBC  * Will notify you of abnormal results, otherwise continue current treatment plan.*  Testing/Procedures: Your physician has recommended that you have a pacemaker/defibrillator generator  change (battery change). Please follow the instructions below, located under the special instructions section.  Follow-Up: Your physician recommends that you schedule a wound check appointment 10-14 days, after your procedure on 12/24/20, with the device clinic.  Your physician recommends that you schedule a follow up appointment in 91 days, after your procedure on 12/24/20, with Dr. [name].  Thank you for choosing [facility]!!      Any Other Special Instructions Will Be Listed Below (If Applicable).    [facility] [place], [place]  [place], [place] [address]  Phone: [phone] Fax: [phone]    Generator Change Procedure Instructions  You are scheduled for a Generator Change (battery change) on  12/24/20  with Dr. [name].  1. Please arrive at the [facility], Entrance "A"  at [facility] at  11:30 am on the day of your procedure. (The address is [address]  2. DIET: You may have a light, early breakfast the morning of your procedure. NOTHING TO EAT AFTER 8:00 AM.  3. LABS: Completed   4. MEDICATIONS: Nothing to hold  5.  Plan for an overnight stay.  Bring your insurance cards and a list of you medications.  6.  Wash your chest and neck with surgical scrub the evening before and the morning of your procedure.  Rinse well. Please review the  surgical scrub instruction sheet given to you.   7. Your chest will need to be shaved prior to this procedure (if needed). We ask that you do this yourself at home 1 to 2 days before or if uncomfortable/unable to do yourself, then it will be performed by the hospital staff the day of.  * Special note:  Every effort is made to have your procedure done on time.  Occasionally there are emergencies that present themselves at the hospital that may cause delays.  Please be patient if a delay does occur.                                                                                                           * If you have any questions after you get home, please call [name], RN at [phone].    [facility] - Preparing For Surgery  Before surgery, you can play an important role. Because skin is not sterile, your skin needs to be as free of germs as possible. You can reduce the number of germs on your skin by washing with CHG (chlorahexidine gluconate) Soap before surgery.  CHG is an antiseptic cleaner which kills germs and bonds with the skin to continue killing germs even after washing.   Please do not use if you have an allergy to CHG or antibacterial soaps.  If your skin becomes reddened/irritated stop using the CHG.   Do not shave (including legs and underarms) for at least 48 hours prior to first CHG shower.  It is OK to shave your face.  Please follow these instructions carefully:  1.  Shower the night before surgery and  the morning of surgery with CHG.  2.  If you choose to wash your hair, wash your hair first as usual with your normal shampoo.  3.  After you shampoo, rinse your hair and body thoroughly to remove the shampoo.  4.  Use CHG as you would any other liquid soap.  You can apply CHG directly to the skin and wash gently with a clean washcloth. 5.  Apply the CHG Soap to your body ONLY FROM THE NECK DOWN.  Do not use on open wounds or open sores.  Avoid contact with your eyes, ears,  mouth and genitals (private parts).    6.  Wash thoroughly, paying special attention to the area where your surgery will be performed.  7.  Thoroughly rinse your body with warm water from the neck down.   8.  DO NOT shower/wash with your normal soap after using and rinsing off the CHG soap.  9.  Pat yourself dry with a clean towel.   10.  Wear clean pajamas.   11.  Place clean sheets on your bed the night of your first shower and do not sleep with pets.  Day of Surgery: Do not apply any deodorants/lotions.  Please wear clean clothes to the hospital/surgery center.    Signed, Sanda Klein, MD  12/11/2020 1:08 PM    [facility] [place], [place], [place]  [address] Phone: [phone]; Fax: [phone]

## 2020-12-11 NOTE — H&P (View-Only) (Signed)
Patient ID: [name], female   DOB: May 12, 1945, 75 y.o.   MRN: [id number] .    Cardiology Office Note    Date:  12/11/2020   ID:  [name], DOB 1945-02-15, MRN [id number]  PCP:  Lujean Amel, MD  Cardiologist:   Sanda Klein, MD   Chief Complaint  Patient presents with   Follow-up   Shortness of Breath   Hypotension     History of Present Illness:  [name] is a 75 y.o. female with CAD s/p CABG October 2016 (LIMA to LAD, SVG to D1, SVG to OM1 and OM2, SVG to PDA), diastolic heart failure, morbid obesity, adrenal insufficiency, type 2 diabetes mellitus, Hypertension, stage III chronic kidney disease, gout, obstructive sleep apnea, asthma. Nonischemic cardiomyopathy and heart failure preceded the diagnosis of coronary disease. She has complete heart block and had a conventional pacemaker implanted in 2001. This was upgraded to a biventricular ICD implanted in 2008 for severe cardiomyopathy with ejection fraction 20%, downgraded to CRT-P in 2014 ([place]) following excellent response to resynchronization (hyper-responder, normalization of left ventricular systolic function). The current atrial and right ventricular leads were implanted in 2001, the left ventricular lead was implanted in 2008. The defibrillator lead implanted in 2008 is capped and its tip was resected at the time of bypass surgery. Ever since bypass surgery, atrial pacing thresholds have been extremely high, so her device is programmed VDD.   A [employer] device subjective recall due to potential sudden battery drain during remote transmissions.  Therefore she has been getting in office checks only.  She had a TIA (left hemiparesis and facial droop 09/16/2020)  and received a right internal carotid stent (TCAR) by Dr. Monica Martinez on August 22. Her AICD was reportedly interrogated at that time. Echo 08/08 showed LVEF 55-60%, E/A was <1, but E/e' was elevated.   About 2 weeks  ago she complained of congestion and was prescribed a steroid Dosepak.  She has been having problems with severe dyspnea at rest, dizziness, weakness, blurry vision and very low blood pressure in the 80s/50s.  She has not had full-blown syncope, but has come close.  She went to [facility] urgent care on [address] on Sunday and had a chest x-ray (reportedly clear) and labs (reportedly showed a high BNP).  Unfortunately cannot review the data from that visit.  She is compliant with Jardiance and torsemide.  She has had occasional episodes of chest pain and has taken 4 or 5 nitroglycerin tablets over the last couple of months.  She has a sore on the back of her right calf that is tender.  She was prescribed an antibiotic cream but no systemic antibiotics.  She has not had fever or chills.  She's lost 37 pounds through dieting over the last year or so.  Her dry weight is uncertain at this point.  She weighs 9 pounds less now than she did at the end of August when she was hospitalized with her stroke.  On presentation today her ECG showed left atrial sensed, ventricular paced rhythm with a very broad paced QRS at 182 ms and a QS pattern in lead V1, consistent with RV apical pacing alone.  Only one of the paced beats suggested some degree of resynchronization.  After increasing the output on her LV lead, the EKG shows a much narrower QRS complex at 124 ms duration, with a distinct positive R wave in lead V1.  Had symptoms of heart failure again in  the fall of 2020 and echocardiogram 01/18/2019 showed EF had deteriorated, down to 40-45% (had been normal in 2017) and left atrial pressures were increased.  On 12/17 she was seen in the clinic and it was noted that she had lost left ventricular capture.  The output was increased in the coronary sinus lead.    Right and left heart catheterization with Dr. Haroldine Laws on 03/11/2019.  Coronary anatomy was unchanged, with known occlusion of the SVG to OM but with other bypasses  patent and with patent stents in the  native left circumflex coronary artery and ramus intermedius.  Left heart filling pressures were perfectly compensated with a wedge pressure of 14, but there was still evidence of mild pulmonary hypertension (PA 34/17, mean 24 mmHg, PVR 1.5, normal cardiac output with an index of 3.1 L/minute/m sq).   Past Medical History:  Diagnosis Date   ABDOMINAL PAIN -GENERALIZED 02/21/2010   Qualifier: Diagnosis of  By: Chester Holstein NP, Nevin Bloodgood     AICD (automatic cardioverter/defibrillator) present    downgraded to CRT-P in 2014   Anemia    Arthritis    Asthma    Atrial fibrillation ([place]) 10/24/2015   Questionable history of A Fib/A Flutter. On device interrogation on 9/7, showed 0.53min of A Fib/A Flutter with 1% burden.  Device check in Jan 2018 showed no sustained AF (runs of less than 30 seconds). No anticoagulation indicated.   CAD (coronary artery disease)    a. 10/09/16 LHC: SVG->LAD patent, SVG->Diag patent, SVG->RCA patent, SVG->LCx occluded. EF 60%, b. 10/31/16 LHC DES to AV groove Circ, DES to intermed branch   Cellulitis and abscess of foot 12/2014   RT FOOT   CHF (congestive heart failure) (HCC)    Chronic bronchitis (HCC)    Chronic lower back pain    Complete heart block ([place]) 06/22/2015   Diverticulosis    Facial numbness 02/2016   Fatty liver disease, nonalcoholic    Gastritis    GERD (gastroesophageal reflux disease)    Gout    H/O hiatal hernia    HEMORRHOIDS-EXTERNAL 02/21/2010   Qualifier: Diagnosis of  By: Chester Holstein NP, Paula     High cholesterol    Hyperplastic colon polyp    Hypertension    IBS (irritable bowel syndrome)    Internal hemorrhoids    INTERNAL HEMORRHOIDS WITHOUT MENTION COMP 04/12/2007   Qualifier: Diagnosis of  By: Olevia Perches MD, Dora M    Nonischemic cardiomyopathy ([place]) 11/22/2011   Pt responded to BiV ICD- last EF 55-60% Sept 2017   Obesity    On home oxygen therapy    "2L at night" (10/31/2016)   OSA (obstructive sleep  apnea)    "can't tolerate a mask" (10/30/2016)   PERSONAL HX COLONIC POLYPS 02/21/2010   Qualifier: Diagnosis of  By: Chester Holstein NP, Paula     Pneumonia    "couple times" (10/31/2016)   Presence of permanent cardiac pacemaker    11/02/12 [employer] V273 INTUA PPM   Right facial numbness 03/05/2016   Shortness of breath    Stroke ([place]) 09/2020   TIA (transient ischemic attack)    "recently" (10/31/2016)   Type II diabetes mellitus ([place])     Past Surgical History:  Procedure Laterality Date   ABDOMINAL ULTRASOUND  12/01/2011   Peripelvic cysts- #1- 2.4x1.9x2.3cm, #2-1.2x0.9x1.2cm   ANKLE FRACTURE SURGERY Right    "had rod put in"   ANTERIOR CERVICAL DECOMP/DISCECTOMY FUSION     APPENDECTOMY     BACK SURGERY  BIOPSY  12/29/2017   Procedure: BIOPSY;  Surgeon: [name], MD;  Location: [facility];  Service: Endoscopy;;   BIV ICD INSERTION CRT-D  2001?   BIV PACEMAKER GENERATOR CHANGE OUT N/A 11/02/2012   Procedure: BIV PACEMAKER GENERATOR CHANGE OUT;  Surgeon: Sanda Klein, MD;  Location: [place] C[facility] LAB;  Service: Cardiovascular;  Laterality: N/A;   CARDIAC CATHETERIZATION  05/17/1999   No significant coronary obstructive disease w/ mild 20% luminal irregularity of the first diag branch of the LAD   CARDIAC CATHETERIZATION  07/08/2002   No significant CAD, moderately depressed LV systolic function   CARDIAC CATHETERIZATION Bilateral 04/26/2007   Normal findings, recommend medical therapy   CARDIAC CATHETERIZATION  02/18/2008   Moderate CAD, would benefit from having a functional study, recommend continue medical therapy   CARDIAC CATHETERIZATION  07/23/2012   Medical therapy   CARDIAC CATHETERIZATION N/A 11/24/2014   Procedure: Left Heart Cath and Coronary Angiography;  Surgeon: Troy Sine, MD;  Location: [place] CV LAB;  Service: Cardiovascular;  Laterality: N/A;   CARDIAC CATHETERIZATION  11/27/2014   Procedure: Intravascular Pressure Wire/FFR Study;  Surgeon:  Peter M Martinique, MD;  Location: [place] CV LAB;  Service: Cardiovascular;;   CARDIAC CATHETERIZATION  10/09/2016   CHOLECYSTECTOMY N/A 04/09/2018   Procedure: LAPAROSCOPIC CHOLECYSTECTOMY;  Surgeon: Greer Pickerel, MD;  [facility]: [facility];  Service: General;  Laterality: N/A;   COLONOSCOPY WITH PROPOFOL N/A 12/29/2017   Procedure: COLONOSCOPY WITH PROPOFOL;  Surgeon: [name], MD;  [facility];  Service: Endoscopy;  Laterality: N/A;   CORONARY ANGIOGRAM  2010   CORONARY ARTERY BYPASS GRAFT N/A 11/29/2014   Procedure: CORONARY ARTERY BYPASS GRAFTING x 5 (LIMA-LAD, SVG-D, SVG-OM1-OM2, SVG-PD);  Surgeon: Melrose Nakayama, MD;  Location: [facility];  Service: Open Heart Surgery;  Laterality: N/A;   CORONARY STENT INTERVENTION N/A 10/31/2016   Procedure: CORONARY STENT INTERVENTION;  Surgeon: Burnell Blanks, MD;  Location: [place] CV LAB;  Service: Cardiovascular;  Laterality: N/A;   ESOPHAGOGASTRODUODENOSCOPY (EGD) WITH PROPOFOL N/A 12/29/2017   Procedure: ESOPHAGOGASTRODUODENOSCOPY (EGD) WITH PROPOFOL;  Surgeon: [name], MD;  [facility];  Service: Endoscopy;  Laterality: N/A;   FRACTURE SURGERY     INSERT / REPLACE / [place] ARTHROSCOPY Bilateral    LEFT HEART CATH AND CORS/GRAFTS ANGIOGRAPHY N/A 12/09/2017   Procedure: LEFT HEART CATH AND CORS/GRAFTS ANGIOGRAPHY;  Surgeon: Troy Sine, MD;  Location: [place] [facility];  Service: Cardiovascular;  Laterality: N/A;   LEFT HEART CATHETERIZATION WITH CORONARY ANGIOGRAM N/A 07/23/2012   Procedure: LEFT HEART CATHETERIZATION WITH CORONARY ANGIOGRAM;  Surgeon: Leonie Man, MD;  Location: [facility] CATH LAB;  Service: Cardiovascular;  Laterality: N/A;   LEXISCAN MYOVIEW  11/14/2011   Mild-moderate defect seen in Mid Inferolateral and Mid Anterolateral regions-consistant w/ infarct/scar. No significant ischemia demonstrated.   POLYPECTOMY  12/29/2017   Procedure: POLYPECTOMY;   Surgeon: [name], MD;  [facility];  Service: Endoscopy;;   RIGHT/LEFT HEART CATH AND CORONARY ANGIOGRAPHY N/A 10/09/2016   Procedure: RIGHT/LEFT HEART CATH AND CORONARY ANGIOGRAPHY;  Surgeon: Jolaine Artist, MD;  Location: [place] [facility] LAB;  Service: Cardiovascular;  Laterality: N/A;   RIGHT/LEFT HEART CATH AND CORONARY/GRAFT ANGIOGRAPHY N/A 03/11/2019   Procedure: RIGHT/LEFT HEART CATH AND CORONARY/GRAFT ANGIOGRAPHY;  Surgeon: Jolaine Artist, MD;  Location: [place] CV LAB;  Service: Cardiovascular;  Laterality: N/A;   TEE WITHOUT CARDIOVERSION N/A 11/29/2014  Procedure: TRANSESOPHAGEAL ECHOCARDIOGRAM (TEE);  Surgeon: Melrose Nakayama, MD;  Location: [place];  Service: Open Heart Surgery;  Laterality: N/A;   TRANSCAROTID ARTERY REVASCULARIZATION  Right 10/01/2020   Procedure: RIGHT TRANSCAROTID ARTERY REVASCULARIZATION;  Surgeon: [name], MD;  Location: [place];  Service: Vascular;  Laterality: Right;   TRANSTHORACIC ECHOCARDIOGRAM  07/23/2012   EF 55-60%, normal-mild   TUBAL LIGATION     ULTRASOUND GUIDANCE FOR VASCULAR ACCESS Left 10/01/2020   Procedure: ULTRASOUND GUIDANCE FOR VASCULAR ACCESS;  Surgeon: [name], MD;  Location: [place];  Service: Vascular;  Laterality: Left;    Current Medications: Outpatient Medications Prior to Visit  Medication Sig Dispense Refill   ACCU-CHEK AVIVA PLUS test strip      Accu-Chek Softclix Lancets lancets      acetaminophen (TYLENOL) 500 MG tablet Take 1,000 mg by mouth 2 (two) times daily as needed (for pain or headaches).     albuterol (PROVENTIL HFA;VENTOLIN HFA) 108 (90 Base) MCG/ACT inhaler Inhale 2 puffs into the lungs every 6 (six) hours as needed for wheezing or shortness of breath. 1 Inhaler 0   albuterol (PROVENTIL) (2.5 MG/3ML) 0.083% nebulizer solution Take 3 mLs (2.5 mg total) by nebulization every 6 (six) hours as needed for wheezing or shortness of breath. 75 mL 0   Alcohol Swabs  (B-D SINGLE USE SWABS REGULAR) PADS      allopurinol (ZYLOPRIM) 100 MG tablet TAKE 1 TABLET BY MOUTH  DAILY 90 tablet 3   aspirin EC 81 MG tablet Take 1 tablet (81 mg total) by mouth daily. 90 tablet 3   budesonide-formoterol (SYMBICORT) 160-4.5 MCG/ACT inhaler Inhale 2 puffs into the lungs 2 (two) times daily.     carboxymethylcellulose (REFRESH PLUS) 0.5 % SOLN Place 1 drop into both eyes 3 (three) times daily.     clopidogrel (PLAVIX) 75 MG tablet Take 1 tablet (75 mg total) by mouth daily. 90 tablet 1   cycloSPORINE (RESTASIS) 0.05 % ophthalmic emulsion Place 1 drop into both eyes every 12 (twelve) hours.     empagliflozin (JARDIANCE) 10 MG TABS tablet Take 10 mg by mouth daily before breakfast. 90 tablet 3   EPINEPHrine 0.3 mg/0.3 mL IJ SOAJ injection Inject 0.3 mg into the muscle once as needed (severe allergic reaction).     ferrous sulfate 325 (65 FE) MG tablet Take 325 mg by mouth daily with breakfast.     fluticasone (FLONASE) 50 MCG/ACT nasal spray Place 1 spray into both nostrils daily.     ipratropium (ATROVENT) 0.02 % nebulizer solution Take 2.5 mLs (0.5 mg total) by nebulization 4 (four) times daily. 75 mL 0   loratadine (CLARITIN) 10 MG tablet Take 10 mg by mouth daily.     Multiple Vitamins-Minerals (HAIR/SKIN/NAILS/BIOTIN) TABS Take 1 tablet by mouth every morning.     nystatin cream (MYCOSTATIN) Apply 1 application topically 2 (two) times daily as needed (rash).     oxyCODONE-acetaminophen (PERCOCET/ROXICET) 5-325 MG tablet Take 1-2 tablets by mouth every 6 (six) hours as needed for moderate pain. 15 tablet 0   OXYGEN Inhale 3 L/min into the lungs continuous.      pantoprazole (PROTONIX) 40 MG tablet Take 1 tablet (40 mg total) by mouth 2 (two) times daily. 180 tablet 3   polyethylene glycol (MIRALAX / GLYCOLAX) 17 g packet Take 17 g by mouth daily.     potassium chloride SA (KLOR-CON) 20 MEQ tablet Take 20 mEq by mouth every morning.     predniSONE (  DELTASONE) 10 MG tablet  Take 10 mg by mouth daily as needed (as directed for gout flares).      ranolazine (RANEXA) 1000 MG SR tablet TAKE 1 TABLET BY MOUTH  TWICE DAILY 180 tablet 1   rosuvastatin (CRESTOR) 40 MG tablet Take 1 tablet (40 mg total) by mouth every evening. NEED OV. 90 tablet 2   sucralfate (CARAFATE) 1 g tablet Take 1 tablet (1 g total) by mouth in the morning, at noon, and at bedtime. As needed 90 tablet 3   torsemide (DEMADEX) 20 MG tablet Take 1 tablet (20 mg total) by mouth daily. 90 tablet 3   isosorbide mononitrate (IMDUR) 30 MG 24 hr tablet Take 3 tablets (90 mg total) by mouth daily. 270 tablet 1   nitroGLYCERIN (NITROSTAT) 0.4 MG SL tablet Place 1 tablet (0.4 mg total) under the tongue every 5 (five) minutes as needed for chest pain. 25 tablet 3   No facility-administered medications prior to visit.     Allergies:   Ivp dye [iodinated diagnostic agents], Shellfish allergy, Sulfa antibiotics, Iodine, Atorvastatin, Benadryl [diphenhydramine], Colchicine, Contrast media [iodinated diagnostic agents], Doxycycline, Uloric [febuxostat], Cephalexin, and Zithromax [azithromycin]   Social History   Socioeconomic History   Marital status: Widowed    Spouse name: Not on file   Number of children: 5   Years of education: Not on file   Highest education level: Not on file  Occupational History   Occupation: STAFF/BUFFET    Employer:  [employer]  Tobacco Use   Smoking status: Former    Packs/day: 0.25    Years: 3.00    Pack years: 0.75    Types: Cigarettes    Quit date: 02/11/1968    Years since quitting: 52.8   Smokeless tobacco: Never  Vaping Use   Vaping Use: Never used  Substance and Sexual Activity   Alcohol use: No   Drug use: No   Sexual activity: Never  Other Topics Concern   Not on file  Social History Narrative   Widowed last year. She lives with her two sons.   Social Determinants of Health   Financial Resource Strain: Not on file  Food Insecurity: Not on file   Transportation Needs: Not on file  Physical Activity: Not on file  Stress: Not on file  Social Connections: Not on file     Family History:  The patient's family history includes Asthma in her sister; Breast cancer in her mother; Diabetes in her maternal grandmother and mother; Glaucoma in her maternal aunt; Heart disease in her maternal aunt and maternal grandmother; Kidney disease in her maternal grandmother.   ROS:   Please see the history of present illness.    ROS All other systems are reviewed and are negative.   PHYSICAL EXAM:   VS:  BP 108/60 (BP Location: Right Arm, Patient Position: Sitting, Cuff Size: Large)   Pulse 70   Ht 5\' 3"  (1.6 m)   Wt 227 lb (103 kg)   BMI 40.21 kg/m      General: Alert, oriented x3, no distress, morbidly obese.  The right subclavian pacemaker site appears healthy, but there are I think 5 distinct surgical scars in this area. Head: no evidence of trauma, PERRL, EOMI, no exophtalmos or lid lag, no myxedema, no xanthelasma; normal ears, nose and oropharynx Neck: normal jugular venous pulsations and no hepatojugular reflux; brisk carotid pulses without delay and no carotid bruits Chest: clear to auscultation, no signs of consolidation by percussion or  palpation, normal fremitus, symmetrical and full respiratory excursions Cardiovascular: normal position and quality of the apical impulse, regular rhythm, normal first and paradoxically split second heart sounds, no murmurs, rubs or gallops Abdomen: no tenderness or distention, no masses by palpation, no abnormal pulsatility or arterial bruits, normal bowel sounds, no hepatosplenomegaly Extremities: no clubbing, cyanosis or edema; 2+ radial, ulnar and brachial pulses bilaterally; 2+ right femoral, posterior tibial and dorsalis pedis pulses; 2+ left femoral, posterior tibial and dorsalis pedis pulses; no subclavian or femoral bruits Neurological: grossly nonfocal Psych: Normal mood and affect    Wt  Readings from Last 3 Encounters:  12/11/20 227 lb (103 kg)  12/05/20 229 lb (103.9 kg)  10/01/20 238 lb (108 kg)      Studies/Labs Reviewed:   EKG:  EKG is ordered today.  The initial tracing shows atrial sensed (sinus), ventricular paced rhythm with frequent PVCs and very broad paced QRS complexes at 182 ms.  Only one of the beats appears to have some degree of LV pacing component with a narrow QRS.  LV lead pacing output was increased to 3.5 mV at 2.0 ms and ECG was repeated.  The follow-up ECG shows a much narrower QRS complex at 124 ms with distinct positive R waves in lead V1.  Recent Labs: 09/26/2020: ALT 9 10/02/2020: BUN 26; Creatinine, Ser 1.66; Hemoglobin 11.7; Platelets 165; Potassium 4.2; Sodium 140   Lipid Panel    Component Value Date/Time   CHOL 169 10/02/2020 0415   CHOL 186 01/21/2019 1045   TRIG 124 10/02/2020 0415   HDL 50 10/02/2020 0415   HDL 44 01/21/2019 1045   CHOLHDL 3.4 10/02/2020 0415   VLDL 25 10/02/2020 0415   LDLCALC 94 10/02/2020 0415   LDLCALC 99 01/21/2019 1045   08/18/2018 Total cholesterol 193, triglycerides 187, HDL 52, LDL 103 Hemoglobin A1c 6.9% 11/25/2018  creatinine 2.13  ASSESSMENT:    1. Acute on chronic diastolic heart failure ([place])   2. Coronary artery disease involving coronary bypass graft of native heart with other forms of angina pectoris ([place])   3. CHB (complete heart block) (HCC)   4. Biventricular cardiac pacemaker battery malfunction, subsequent encounter   5. Obesity hypoventilation syndrome ([place])   6. Morbid (severe) obesity due to excess calories ([place])   7. Mixed hypercholesterolemia and hypertriglyceridemia   8. Type 2 diabetes mellitus with stage 3b chronic kidney disease, without long-term current use of insulin (HCC)   9. Stage 3b chronic kidney disease ([place])   10. Pre-procedure lab exam      PLAN:  In order of problems listed above:  CHF: Reportedly her BNP was elevated, but I do not have those results.  She  also has symptomatic hypotension.  The precipitating factor appears to be loss of effective cardiac resynchronization pacing.  She is receiving essentially exclusively RV apical pacing and this has previously led to heart failure decompensation.  LV lead pacing outputs reprogrammed today.  She now appears to be receiving good LV pacing and resynchronization.  We will allow a week or 2 for her to recover from heart failure exacerbation. CAD s/p CABG: She is recently had some angina pectoris, but I suspect this is related to heart failure exacerbation rather than a new coronary stenosis.  When she had her last cardiac catheterization in January 2021, all major coronary territories have adequate coronary flow via bypasses or native vessels with stents.   On lifelong dual antiplatelet therapy.  Receiving statin.  She was on a  highly selective beta-blocker, which she was tolerating well, but this has been stopped.  It appears that it was stopped when she presented with her stroke in August.  We will not restart it now with her low blood pressure, but plan to restart it when she has her pacemaker generator change out.. CHB: She is pacemaker dependent. CRT-P: The current device generator is under advisory and we are unable to perform remote downloads due to the risk of sudden battery drain.  We have been checking in the office only.  This is possibly why we have missed the fact that she was not getting appropriate LV pacing capture.  Discussed with industry.  They recommend changing out the generator, even though there is roughly 1 year of battery remaining.  Device is programmed VDD due to very high atrial pacing thresholds.  She never requires atrial pacing and atrial sensing is still acceptable at approximately 1.0 mV. The current right atrial lead and right ventricular pacing lead were placed in 2001. The current left ventricular lead was placed in 2008.  LV lead pacing thresholds have been relatively high, but  still usable.  Since there are multiple issues with her pacing system, revising the right atrial and left ventricular leads is considered.  However, she has multiple surgical interventions and has numerous comorbidities that put her at extremely high risk for complications.  I think as long as we can still obtain capture with the LV lead we should keep the current pacing system and use.  I do recommend using an Aegis pouch at the time of the generator change out.This procedure has been fully reviewed with the patient and written informed consent has been obtained. OSA/obesity-hypoventilation sd: Note that an ABG performed on 09/26/2020 showed a normal pH of 7.36 with a PCO2 of 62, consistent with severe chronic hypoventilation.  Intolerant of CPAP; pulmonary hypertension due to sleep apnea and obesity hypoventilation syndrome are likely contributing to a large degree to her shortness of breath.  She also has significant reactive airway disease and has periodically required steroid therapy. Morbid obesity: She is making a lot of progress with her new diet and she is to be congratulated.  The only disadvantage is I no longer really know what her "dry weight" might be. HLP: Despite being on a highly active statin, her LDL cholesterol was still well above target at 94.  She is on maximum dose rosuvastatin.  We will plan to add ezetimibe. DM: Control is substantially improved with A1c down to 6.5% and this is reflected in her triglyceride level, which is also now normal. CKD 3b: Most recent creatinine in August was 1.66.  Do not have access to the most recent labs drawn at the urgent care center.  She has a history of gout and we should avoid excessive diuresis, especially with thiazide diuretics.  Etiology of her dyspnea is multifactorial, including morbid obesity and restrictive physiology, asthma, pulmonary hypertension due to obesity and obstructive sleep apnea as well as heart failure    Medication  Adjustments/Labs and Tests Ordered: Current medicines are reviewed at length with the patient today.  Concerns regarding medicines are outlined above.  Medication changes, Labs and Tests ordered today are listed in the Patient Instructions below. Patient Instructions  Medication Instructions: STOP the Isosorbide (Imdur)  Labwork: Pre procedure lab work today: BMET & CBC  * Will notify you of abnormal results, otherwise continue current treatment plan.*  Testing/Procedures: Your physician has recommended that you have a pacemaker/defibrillator generator  change (battery change). Please follow the instructions below, located under the special instructions section.  Follow-Up: Your physician recommends that you schedule a wound check appointment 10-14 days, after your procedure on 12/24/20, with the device clinic.  Your physician recommends that you schedule a follow up appointment in 91 days, after your procedure on 12/24/20, with Dr. [name].  Thank you for choosing [facility]!!      Any Other Special Instructions Will Be Listed Below (If Applicable).    [facility] [place], [place]  [place], [place] [address]  Phone: [phone] Fax: [phone]    Generator Change Procedure Instructions  You are scheduled for a Generator Change (battery change) on  12/24/20  with Dr. [name].  1. Please arrive at the [facility], Entrance "A"  at [facility] at  11:30 am on the day of your procedure. (The address is [address]  2. DIET: You may have a light, early breakfast the morning of your procedure. NOTHING TO EAT AFTER 8:00 AM.  3. LABS: Completed   4. MEDICATIONS: Nothing to hold  5.  Plan for an overnight stay.  Bring your insurance cards and a list of you medications.  6.  Wash your chest and neck with surgical scrub the evening before and the morning of your procedure.  Rinse well. Please review the  surgical scrub instruction sheet given to you.   7. Your chest will need to be shaved prior to this procedure (if needed). We ask that you do this yourself at home 1 to 2 days before or if uncomfortable/unable to do yourself, then it will be performed by the hospital staff the day of.  * Special note:  Every effort is made to have your procedure done on time.  Occasionally there are emergencies that present themselves at the hospital that may cause delays.  Please be patient if a delay does occur.                                                                                                           * If you have any questions after you get home, please call [name], RN at [phone].    [place] - Preparing For Surgery  Before surgery, you can play an important role. Because skin is not sterile, your skin needs to be as free of germs as possible. You can reduce the number of germs on your skin by washing with CHG (chlorahexidine gluconate) Soap before surgery.  CHG is an antiseptic cleaner which kills germs and bonds with the skin to continue killing germs even after washing.   Please do not use if you have an allergy to CHG or antibacterial soaps.  If your skin becomes reddened/irritated stop using the CHG.   Do not shave (including legs and underarms) for at least 48 hours prior to first CHG shower.  It is OK to shave your face.  Please follow these instructions carefully:  1.  Shower the night before surgery and  the morning of surgery with CHG.  2.  If you choose to wash your hair, wash your hair first as usual with your normal shampoo.  3.  After you shampoo, rinse your hair and body thoroughly to remove the shampoo.  4.  Use CHG as you would any other liquid soap.  You can apply CHG directly to the skin and wash gently with a clean washcloth. 5.  Apply the CHG Soap to your body ONLY FROM THE NECK DOWN.  Do not use on open wounds or open sores.  Avoid contact with your eyes, ears,  mouth and genitals (private parts).    6.  Wash thoroughly, paying special attention to the area where your surgery will be performed.  7.  Thoroughly rinse your body with warm water from the neck down.   8.  DO NOT shower/wash with your normal soap after using and rinsing off the CHG soap.  9.  Pat yourself dry with a clean towel.   10.  Wear clean pajamas.   11.  Place clean sheets on your bed the night of your first shower and do not sleep with pets.  Day of Surgery: Do not apply any deodorants/lotions.  Please wear clean clothes to the hospital/surgery center.    Signed, Sanda Klein, MD  12/11/2020 1:08 PM    [facility] [place], [place], [place]  [address] Phone: [phone]; Fax: [phone]

## 2020-12-11 NOTE — Patient Instructions (Signed)
Medication Instructions: STOP the Isosorbide (Imdur)  Labwork: Pre procedure lab work today: BMET & CBC  * Will notify you of abnormal results, otherwise continue current treatment plan.*  Testing/Procedures: Your physician has recommended that you have a pacemaker/defibrillator generator change (battery change). Please follow the instructions below, located under the special instructions section.  Follow-Up: Your physician recommends that you schedule a wound check appointment 10-14 days, after your procedure on 12/24/20, with the device clinic.  Your physician recommends that you schedule a follow up appointment in 91 days, after your procedure on 12/24/20, with Dr. [name].  Thank you for choosing [facility]!!      Any Other Special Instructions Will Be Listed Below (If Applicable).    [facility] at [place], [place]  [place], [place] [address]  Phone: [phone] Fax: [phone]    Generator Change Procedure Instructions  You are scheduled for a Generator Change (battery change) on  12/24/20  with Dr. [name].  1. Please arrive at the [facility], Entrance "A"  at [facility] at  11:30 am on the day of your procedure. (The address is [address])  2. DIET: You may have a light, early breakfast the morning of your procedure. NOTHING TO EAT AFTER 8:00 AM.  3. LABS: Completed   4. MEDICATIONS: Nothing to hold  5.  Plan for an overnight stay.  Bring your insurance cards and a list of you medications.  6.  Wash your chest and neck with surgical scrub the evening before and the morning of your procedure.  Rinse well. Please review the surgical scrub instruction sheet given to you.   7. Your chest will need to be shaved prior to this procedure (if needed). We ask that you do this yourself at home 1 to 2 days before or if uncomfortable/unable to do yourself, then it will be performed by the hospital  staff the day of.  * Special note:  Every effort is made to have your procedure done on time.  Occasionally there are emergencies that present themselves at the hospital that may cause delays.  Please be patient if a delay does occur.                                                                                                           * If you have any questions after you get home, please call [name], RN at [phone].    [place] - Preparing For Surgery  Before surgery, you can play an important role. Because skin is not sterile, your skin needs to be as free of germs as possible. You can reduce the number of germs on your skin by washing with CHG (chlorahexidine gluconate) Soap before surgery.  CHG is an antiseptic cleaner which kills germs and bonds with the skin to continue killing germs even after washing.   Please do not use if you have an allergy to CHG or antibacterial soaps.  If your skin becomes reddened/irritated stop using the CHG.  Do not shave (including legs and underarms) for at least 48 hours prior to first CHG shower.  It is OK to shave your face.  Please follow these instructions carefully:  1.  Shower the night before surgery and the morning of surgery with CHG.  2.  If you choose to wash your hair, wash your hair first as usual with your normal shampoo.  3.  After you shampoo, rinse your hair and body thoroughly to remove the shampoo.  4.  Use CHG as you would any other liquid soap.  You can apply CHG directly to the skin and wash gently with a clean washcloth. 5.  Apply the CHG Soap to your body ONLY FROM THE NECK DOWN.  Do not use on open wounds or open sores.  Avoid contact with your eyes, ears, mouth and genitals (private parts).    6.  Wash thoroughly, paying special attention to the area where your surgery will be performed.  7.  Thoroughly rinse your body with warm water from the neck down.   8.  DO NOT shower/wash with your normal soap after using and  rinsing off the CHG soap.  9.  Pat yourself dry with a clean towel.   10.  Wear clean pajamas.   11.  Place clean sheets on your bed the night of your first shower and do not sleep with pets.  Day of Surgery: Do not apply any deodorants/lotions.  Please wear clean clothes to the hospital/surgery center.

## 2020-12-12 ENCOUNTER — Telehealth: Payer: Self-pay | Admitting: Cardiovascular Disease

## 2020-12-12 LAB — BASIC METABOLIC PANEL
BUN/Creatinine Ratio: 21 (ref 12–28)
BUN: 46 mg/dL — ABNORMAL HIGH (ref 8–27)
CO2: 42 mmol/L (ref 20–29)
Calcium: 9.4 mg/dL (ref 8.7–10.3)
Chloride: 87 mmol/L — ABNORMAL LOW (ref 96–106)
Creatinine, Ser: 2.18 mg/dL — ABNORMAL HIGH (ref 0.57–1.00)
Glucose: 149 mg/dL — ABNORMAL HIGH (ref 70–99)
Potassium: 3.3 mmol/L — ABNORMAL LOW (ref 3.5–5.2)
Sodium: 143 mmol/L (ref 134–144)
eGFR: 23 mL/min/{1.73_m2} — ABNORMAL LOW (ref >59)

## 2020-12-12 LAB — CBC
Hematocrit: 38 % (ref 34.0–46.6)
Hemoglobin: 12.1 g/dL (ref 11.1–15.9)
MCH: 28.9 pg (ref 26.6–33.0)
MCHC: 31.8 g/dL (ref 31.5–35.7)
MCV: 91 fL (ref 79–97)
Platelets: 196 10*3/uL (ref 150–450)
RBC: 4.18 x10E6/uL (ref 3.77–5.28)
RDW: 12.5 % (ref 11.7–15.4)
WBC: 7 10*3/uL (ref 3.4–10.8)

## 2020-12-12 NOTE — Telephone Encounter (Signed)
Croitoru, [name], MD  You 27 minutes ago (4:17 PM)   I did not expect the changes we made yesterday to "fix" her complaints right away, but I am hopeful that she will be better by the weekend. Keep [place] posted, please.    Relayed message from MD. Advised she keep [place] updated on weight, as request per lab results

## 2020-12-12 NOTE — Telephone Encounter (Signed)
Pt c/o Shortness Of Breath: STAT if SOB developed within the last 24 hours or pt is noticeably SOB on the phone  1. Are you currently SOB (can you hear that pt is SOB on the phone)? YES, NO I DO NOT HEAR IT OVER THE PHONE  2. How long have you been experiencing SOB? UP TO A WEEK  3. Are you SOB when sitting or when up moving around? BOTH  4. Are you currently experiencing any other symptoms? WEAKNESS, DEHYDRATION. PT IS CURRENTLY ONLY DRINKING GATERADE AND UNSWEET TEA, SHE WANTS TO KNOW WHAT MEDS SHE CAN TAKE TO HELP

## 2020-12-12 NOTE — Telephone Encounter (Signed)
Spoke with patient of Dr. [name] -- seen yesterday 11/1  She had labs done -- showed dehydration  She reports drinking sugar free gatorade, unsweet tea, water  She reports good UOP - urine is bright yellow, odorless  Patient reports discussing shortness of breath with MD yesterday  She thinks her SOB has worsened since yesterday, but is not audibly short of breath on the phone   Diet today: steakums (not on bread), pinto beans She monitors sodium in her diet  She also reports her BP has been real low - was 80/50 on Sunday at Urgent Care -- went for SOB -- imdur stopped yesterday   Advised will send Dr. Loletha Grayer this update and follow up with any suggestions

## 2020-12-13 NOTE — Telephone Encounter (Signed)
Spoke to patient.  Patient states Dr [name] wanted her to call in with her weight for today  230 lbs today 227 lbs yesterday   Patient also states she would like for Dr [name] to call in antibiotics for her legs- she has cellulitis in both legs .  Both shin front and back of legs. Patient states Dr [name] saw her leg yesterday but the other leg develop over night.  Patient aware will defer To Dr [name]

## 2020-12-13 NOTE — Telephone Encounter (Signed)
  Pt is requesting to speak with [name], she said to discuss her weight

## 2020-12-14 MED ORDER — AMOXICILLIN 500 MG PO CAPS: 500.0000 mg | ORAL_CAPSULE | Freq: Three times a day (TID) | ORAL | 0 refills | Status: DC

## 2020-12-14 NOTE — Telephone Encounter (Signed)
Croitoru, [name], MD  You 16 minutes ago (2:41 PM)   Amoxicillin 500 mg three times daily for 10 days instead of cefazolin,  please.   Patient made aware

## 2020-12-14 NOTE — Addendum Note (Signed)
Addended by: Ricci Barker on: 12/14/2020 03:01 PM   Modules accepted: Orders

## 2020-12-14 NOTE — Telephone Encounter (Addendum)
Call placed to the patient. She stated that she was feeling better today and her breathing was much better. Per Dr. [name], she will restart the Torsemide tomorrow.  She is allergic to Kelfex. She stated that she was given an antibiotic by home health [phone]) a few months ago but could not remember the name.   Call placed to [facility]. They stated that in April she was prescribed Amoxicillin for cellulitis.

## 2020-12-18 ENCOUNTER — Ambulatory Visit (INDEPENDENT_AMBULATORY_CARE_PROVIDER_SITE_OTHER): Payer: Medicare Other | Admitting: Physician Assistant

## 2020-12-18 ENCOUNTER — Encounter: Payer: Self-pay | Admitting: Physician Assistant

## 2020-12-18 ENCOUNTER — Ambulatory Visit (HOSPITAL_COMMUNITY)
Admission: RE | Admit: 2020-12-18 | Discharge: 2020-12-18 | Disposition: A | Discharge status: Home / Self Care | Payer: Medicare Other | Source: Ambulatory Visit | Attending: Vascular Surgery | Admitting: Vascular Surgery

## 2020-12-18 ENCOUNTER — Other Ambulatory Visit: Payer: Self-pay

## 2020-12-18 VITALS — BP 154/76 | HR 76 | Temp 98.1°F | Resp 20 | Ht 63.0 in | Wt 227.0 lb

## 2020-12-18 DIAGNOSIS — I6529 Occlusion and stenosis of unspecified carotid artery: Secondary | ICD-10-CM | POA: Diagnosis not present

## 2020-12-18 DIAGNOSIS — I6521 Occlusion and stenosis of right carotid artery: Secondary | ICD-10-CM

## 2020-12-18 DIAGNOSIS — I6523 Occlusion and stenosis of bilateral carotid arteries: Secondary | ICD-10-CM

## 2020-12-18 NOTE — Progress Notes (Signed)
Carotid Artery Follow-Up   VASCULAR SURGERY ASSESSMENT & PLAN:   [name] is a 75 y.o. female s/p transcatheter placement of intravascular stent in the right carotid artery after cutdown with percutaneous access including angioplasty and distal embolic protection with flow reversal (right TCAR) on August 22/2022 by Dr. Carlis Abbott. This was performed for high-grade symptomatic right internal carotid artery stenosis (>80%). She presented to the hospital with left sided facial weakness.  Left ICA stenosis estimated at 40-59%   Bilateral carotid artery stenosis: The patient has no symptoms referable to carotid artery stenosis.  Duplex examination today reveals patent right carotid stent.  Left carotid duplex not performed today. We reviewed the signs and symptoms of stroke/TIA and advised the patient to call EMS should these occur.    Continue optimal medical management of diabetes, hypertension and follow-up with primary care physician. Encouraged continuation of complete smoking cessation. Continue the following medications: Plavix, aspirin and statin Follow-up in 6 months with carotid duplex ultrasound.  SUBJECTIVE:   The patient denies monocular blindness, slurred speech, facial drooping, extremity weakness or numbness.  PHYSICAL EXAM:   Vitals:   12/18/20 1522 12/18/20 1524  BP: 140/76 (!) 154/76  Pulse: 76   Resp: 20   Temp: 98.1 F (36.7 C)   SpO2: 99%      General appearance: Well-developed, well-nourished in no apparent distress Neurologic: Alert and oriented x4, tongue is midline, face symmetric, speech fluent, 5 out of 5 bilateral upper extremity grip strength Neck; incision well healed Cardiovascular: Heart rate and rhythm are regular.  No carotid bruits. Respirations: Nonlabored; O2 via [place] in place    NON-INVASIVE VASCULAR STUDIES   12/18/2020 Right Carotid: Patent right ICA stent <50% stenosis.  PROBLEM LIST:    The patient's past medical history, past  surgical history, family history, social history, allergy list and medication list are reviewed.   CURRENT MEDS:    Current Outpatient Medications:    ACCU-CHEK AVIVA PLUS test strip, , Disp: , Rfl:    Accu-Chek Softclix Lancets lancets, , Disp: , Rfl:    acetaminophen (TYLENOL) 500 MG tablet, Take 1,000 mg by mouth 2 (two) times daily as needed (for pain or headaches)., Disp: , Rfl:    albuterol (PROVENTIL HFA;VENTOLIN HFA) 108 (90 Base) MCG/ACT inhaler, Inhale 2 puffs into the lungs every 6 (six) hours as needed for wheezing or shortness of breath., Disp: 1 Inhaler, Rfl: 0   albuterol (PROVENTIL) (2.5 MG/3ML) 0.083% nebulizer solution, Take 3 mLs (2.5 mg total) by nebulization every 6 (six) hours as needed for wheezing or shortness of breath., Disp: 75 mL, Rfl: 0   Alcohol Swabs (B-D SINGLE USE SWABS REGULAR) PADS, , Disp: , Rfl:    allopurinol (ZYLOPRIM) 100 MG tablet, TAKE 1 TABLET BY MOUTH  DAILY, Disp: 90 tablet, Rfl: 3   amoxicillin (AMOXIL) 500 MG capsule, Take 1 capsule (500 mg total) by mouth 3 (three) times daily., Disp: 30 capsule, Rfl: 0   aspirin EC 81 MG tablet, Take 1 tablet (81 mg total) by mouth daily., Disp: 90 tablet, Rfl: 3   budesonide-formoterol (SYMBICORT) 160-4.5 MCG/ACT inhaler, Inhale 2 puffs into the lungs 2 (two) times daily., Disp: , Rfl:    carboxymethylcellulose (REFRESH PLUS) 0.5 % SOLN, Place 1 drop into both eyes 3 (three) times daily., Disp: , Rfl:    clopidogrel (PLAVIX) 75 MG tablet, Take 1 tablet (75 mg total) by mouth daily., Disp: 90 tablet, Rfl: 1   cycloSPORINE (RESTASIS) 0.05 % ophthalmic emulsion,  Place 1 drop into both eyes every 12 (twelve) hours., Disp: , Rfl:    empagliflozin (JARDIANCE) 10 MG TABS tablet, Take 10 mg by mouth daily before breakfast., Disp: 90 tablet, Rfl: 3   EPINEPHrine 0.3 mg/0.3 mL IJ SOAJ injection, Inject 0.3 mg into the muscle once as needed (severe allergic reaction)., Disp: , Rfl:    ferrous sulfate 325 (65 FE) MG tablet,  Take 325 mg by mouth daily with breakfast., Disp: , Rfl:    fluticasone (FLONASE) 50 MCG/ACT nasal spray, Place 1 spray into both nostrils daily., Disp: , Rfl:    ipratropium (ATROVENT) 0.02 % nebulizer solution, Take 2.5 mLs (0.5 mg total) by nebulization 4 (four) times daily., Disp: 75 mL, Rfl: 0   loratadine (CLARITIN) 10 MG tablet, Take 10 mg by mouth daily., Disp: , Rfl:    Multiple Vitamins-Minerals (HAIR/SKIN/NAILS/BIOTIN) TABS, Take 1 tablet by mouth every morning., Disp: , Rfl:    nitroGLYCERIN (NITROSTAT) 0.4 MG SL tablet, Place 1 tablet (0.4 mg total) under the tongue every 5 (five) minutes as needed for chest pain., Disp: 25 tablet, Rfl: 3   nystatin cream (MYCOSTATIN), Apply 1 application topically 2 (two) times daily as needed (rash)., Disp: , Rfl:    oxyCODONE-acetaminophen (PERCOCET/ROXICET) 5-325 MG tablet, Take 1-2 tablets by mouth every 6 (six) hours as needed for moderate pain., Disp: 15 tablet, Rfl: 0   OXYGEN, Inhale 3 L/min into the lungs continuous. , Disp: , Rfl:    pantoprazole (PROTONIX) 40 MG tablet, Take 1 tablet (40 mg total) by mouth 2 (two) times daily., Disp: 180 tablet, Rfl: 3   polyethylene glycol (MIRALAX / GLYCOLAX) 17 g packet, Take 17 g by mouth daily., Disp: , Rfl:    potassium chloride SA (KLOR-CON) 20 MEQ tablet, Take 20 mEq by mouth every morning., Disp: , Rfl:    predniSONE (DELTASONE) 10 MG tablet, Take 10 mg by mouth daily as needed (as directed for gout flares). , Disp: , Rfl:    ranolazine (RANEXA) 1000 MG SR tablet, TAKE 1 TABLET BY MOUTH  TWICE DAILY, Disp: 180 tablet, Rfl: 1   rosuvastatin (CRESTOR) 40 MG tablet, Take 1 tablet (40 mg total) by mouth every evening. NEED OV., Disp: 90 tablet, Rfl: 2   sucralfate (CARAFATE) 1 g tablet, Take 1 tablet (1 g total) by mouth in the morning, at noon, and at bedtime. As needed, Disp: 90 tablet, Rfl: 3   torsemide (DEMADEX) 20 MG tablet, Take 1 tablet (20 mg total) by mouth daily., Disp: 90 tablet, Rfl: 3    REVIEW OF SYSTEMS:   [X]  denotes positive finding, [ ]  denotes negative finding Cardiac  Comments:  Chest pain or chest pressure:    Shortness of breath upon exertion: x   Short of breath when lying flat:    Irregular heart rhythm:        Vascular    Pain in calf, thigh, or hip brought on by ambulation:    Pain in feet at night that wakes you up from your sleep:     Blood clot in your veins:    Leg swelling:  x       Pulmonary    Oxygen at home: x   Productive cough:     Wheezing:         Neurologic    Sudden weakness in arms or legs:     Sudden numbness in arms or legs:     Sudden onset of difficulty speaking or slurred  speech:    Temporary loss of vision in one eye:     Problems with dizziness:         Gastrointestinal    Blood in stool:     Vomited blood:         Genitourinary    Burning when urinating:     Blood in urine:        Psychiatric    Major depression:         Hematologic    Bleeding problems:    Problems with blood clotting too easily:        Skin    Rashes or ulcers:        Constitutional    Fever or chills:     Barbie Banner, PA-C  Office: [phone] 12/18/2020 Dr. Carlis Abbott

## 2020-12-20 ENCOUNTER — Other Ambulatory Visit: Payer: Self-pay

## 2020-12-20 DIAGNOSIS — I6523 Occlusion and stenosis of bilateral carotid arteries: Secondary | ICD-10-CM

## 2020-12-26 DIAGNOSIS — M545 Low back pain, unspecified: Secondary | ICD-10-CM | POA: Diagnosis not present

## 2020-12-28 ENCOUNTER — Telehealth: Payer: Self-pay | Admitting: Gastroenterology

## 2020-12-28 NOTE — Telephone Encounter (Signed)
Spoke with the patient and discussed the patches and importance of moving around to change position. She expresses understanding. She will get the lidocaine patches and follow the direction on the box. Follow up by phone next week.

## 2020-12-28 NOTE — Telephone Encounter (Signed)
Inbound call from patient requesting to speak with nurse. States she went to urgent care a few days ago and is having stomach issues. She would not go into detail.

## 2020-12-28 NOTE — Telephone Encounter (Signed)
Abdominal pain likely due to body habitus and muscle strain from sitting constantly in the same position.  Please advise patient to use lidocaine patch as needed, continue with the muscle relaxer.  It will help to change position and avoid sitting continuously You can consider CT abdomen pelvis for further evaluation if she continues to have significant pain

## 2020-12-28 NOTE — Telephone Encounter (Signed)
Patient went to an urgent care for her abdominal pain "a few days ago." UTI was ruled out. She was given a muscle relaxer for PRN use for presumed back strain. She states she was told that she should contact GI if she continued to have abdominal pain. Reports the pain is mid-upper abdominal. No indigestion symptoms. "It just hurts until I lay down." Denies vomiting. Admits to some nausea. No bowel movement changes.   She does feel "weak" after a bowel movement. Further questioning she feels this weakness when getting up from sitting all the time.  No syncope or near syncope. She saw cardiology 12/12/20 and will have a generator change Monday 12/31/20.  Patient asks her abdominal pain be addressed.

## 2020-12-30 ENCOUNTER — Other Ambulatory Visit: Payer: Self-pay | Admitting: *Deleted

## 2020-12-30 DIAGNOSIS — I442 Atrioventricular block, complete: Secondary | ICD-10-CM

## 2020-12-31 ENCOUNTER — Encounter (HOSPITAL_COMMUNITY)
Admission: RE | Disposition: A | Discharge status: Home / Self Care | Payer: Self-pay | Source: Ambulatory Visit | Attending: Cardiovascular Disease

## 2020-12-31 ENCOUNTER — Ambulatory Visit (HOSPITAL_COMMUNITY)
Admission: RE | Admit: 2020-12-31 | Discharge: 2020-12-31 | Disposition: A | Discharge status: Home / Self Care | Payer: Medicare Other | Source: Ambulatory Visit | Attending: Cardiovascular Disease | Admitting: Cardiovascular Disease

## 2020-12-31 ENCOUNTER — Other Ambulatory Visit: Payer: Self-pay

## 2020-12-31 DIAGNOSIS — I130 Hypertensive heart and chronic kidney disease with heart failure and stage 1 through stage 4 chronic kidney disease, or unspecified chronic kidney disease: Secondary | ICD-10-CM | POA: Insufficient documentation

## 2020-12-31 DIAGNOSIS — Z45010 Encounter for checking and testing of cardiac pacemaker pulse generator [battery]: Secondary | ICD-10-CM | POA: Diagnosis not present

## 2020-12-31 DIAGNOSIS — I25708 Atherosclerosis of coronary artery bypass graft(s), unspecified, with other forms of angina pectoris: Secondary | ICD-10-CM | POA: Diagnosis not present

## 2020-12-31 DIAGNOSIS — N1832 Chronic kidney disease, stage 3b: Secondary | ICD-10-CM | POA: Diagnosis not present

## 2020-12-31 DIAGNOSIS — I5031 Acute diastolic (congestive) heart failure: Secondary | ICD-10-CM | POA: Insufficient documentation

## 2020-12-31 DIAGNOSIS — Z6841 Body Mass Index (BMI) 40.0 and over, adult: Secondary | ICD-10-CM | POA: Insufficient documentation

## 2020-12-31 DIAGNOSIS — I442 Atrioventricular block, complete: Secondary | ICD-10-CM | POA: Diagnosis not present

## 2020-12-31 DIAGNOSIS — Z951 Presence of aortocoronary bypass graft: Secondary | ICD-10-CM | POA: Diagnosis not present

## 2020-12-31 DIAGNOSIS — Z950 Presence of cardiac pacemaker: Secondary | ICD-10-CM | POA: Diagnosis present

## 2020-12-31 DIAGNOSIS — E782 Mixed hyperlipidemia: Secondary | ICD-10-CM | POA: Diagnosis not present

## 2020-12-31 DIAGNOSIS — E1122 Type 2 diabetes mellitus with diabetic chronic kidney disease: Secondary | ICD-10-CM | POA: Insufficient documentation

## 2020-12-31 DIAGNOSIS — I5042 Chronic combined systolic (congestive) and diastolic (congestive) heart failure: Secondary | ICD-10-CM

## 2020-12-31 DIAGNOSIS — I5032 Chronic diastolic (congestive) heart failure: Secondary | ICD-10-CM | POA: Diagnosis not present

## 2020-12-31 DIAGNOSIS — G4733 Obstructive sleep apnea (adult) (pediatric): Secondary | ICD-10-CM | POA: Diagnosis not present

## 2020-12-31 HISTORY — PX: PPM GENERATOR CHANGEOUT: EP1233

## 2020-12-31 LAB — BASIC METABOLIC PANEL
Anion gap: 8 (ref 5–15)
BUN: 19 mg/dL (ref 8–23)
CO2: 34 mmol/L — ABNORMAL HIGH (ref 22–32)
Calcium: 9.3 mg/dL (ref 8.9–10.3)
Chloride: 98 mmol/L (ref 98–111)
Creatinine, Ser: 1.26 mg/dL — ABNORMAL HIGH (ref 0.44–1.00)
GFR, Estimated: 44 mL/min — ABNORMAL LOW (ref >60)
Glucose, Bld: 135 mg/dL — ABNORMAL HIGH (ref 70–99)
Potassium: 4.1 mmol/L (ref 3.5–5.1)
Sodium: 140 mmol/L (ref 135–145)

## 2020-12-31 LAB — GLUCOSE, CAPILLARY
Glucose-Capillary: 127 mg/dL — ABNORMAL HIGH (ref 70–99)
Glucose-Capillary: 95 mg/dL (ref 70–99)

## 2020-12-31 SURGERY — PPM GENERATOR CHANGEOUT

## 2020-12-31 MED ORDER — SODIUM CHLORIDE 0.9% FLUSH: 3.0000 mL | Freq: Two times a day (BID) | INTRAVENOUS | Status: DC

## 2020-12-31 MED ORDER — HYDRALAZINE HCL 20 MG/ML IJ SOLN
20.0000 mg | Freq: Once | INTRAMUSCULAR | Status: DC
  Filled 2020-12-31: qty 1

## 2020-12-31 MED ORDER — ONDANSETRON HCL 4 MG/2ML IJ SOLN: 4.0000 mg | Freq: Four times a day (QID) | INTRAMUSCULAR | Status: DC | PRN

## 2020-12-31 MED ORDER — SODIUM CHLORIDE 0.9 % IV SOLN: 250.0000 mL | INTRAVENOUS | Status: DC | PRN

## 2020-12-31 MED ORDER — VANCOMYCIN HCL 1500 MG/300ML IV SOLN
1500.0000 mg | INTRAVENOUS | Status: AC
  Administered 2020-12-31: 1500 mg via INTRAVENOUS
  Filled 2020-12-31: qty 300

## 2020-12-31 MED ORDER — SODIUM CHLORIDE 0.9 % IV SOLN: INTRAVENOUS | Status: DC

## 2020-12-31 MED ORDER — ACETAMINOPHEN 325 MG PO TABS: 325.0000 mg | ORAL_TABLET | ORAL | Status: DC | PRN

## 2020-12-31 MED ORDER — ACETAMINOPHEN 325 MG PO TABS
ORAL_TABLET | ORAL | Status: AC
  Filled 2020-12-31: qty 2

## 2020-12-31 MED ORDER — SODIUM CHLORIDE 0.9 % IV SOLN
80.0000 mg | INTRAVENOUS | Status: AC
  Administered 2020-12-31: 80 mg

## 2020-12-31 MED ORDER — SODIUM CHLORIDE 0.9 % IV SOLN
INTRAVENOUS | Status: AC
  Filled 2020-12-31: qty 2

## 2020-12-31 MED ORDER — LIDOCAINE HCL (PF) 1 % IJ SOLN
INTRAMUSCULAR | Status: DC | PRN
  Administered 2020-12-31: 60 mL

## 2020-12-31 MED ORDER — CHLORHEXIDINE GLUCONATE 4 % EX LIQD: 4.0000 "application " | Freq: Once | CUTANEOUS | Status: DC

## 2020-12-31 MED ORDER — ACETAMINOPHEN 325 MG PO TABS
650.0000 mg | ORAL_TABLET | Freq: Once | ORAL | Status: AC
  Administered 2020-12-31: 650 mg via ORAL

## 2020-12-31 MED ORDER — SODIUM CHLORIDE 0.9% FLUSH: 3.0000 mL | INTRAVENOUS | Status: DC | PRN

## 2020-12-31 MED ORDER — HYDRALAZINE HCL 25 MG PO TABS
25.0000 mg | ORAL_TABLET | Freq: Once | ORAL | Status: AC
  Administered 2020-12-31: 25 mg via ORAL
  Filled 2020-12-31: qty 1

## 2020-12-31 MED ORDER — LIDOCAINE HCL (PF) 1 % IJ SOLN
INTRAMUSCULAR | Status: AC
  Filled 2020-12-31: qty 60

## 2020-12-31 SURGICAL SUPPLY — 6 items
CABLE SURGICAL S-101-97-12 (CABLE) ×2 IMPLANT
PACEMAKER VISIONIST IS-1 U225 (Pacemaker) ×2 IMPLANT
PAD DEFIB RADIO PHYSIO CONN (PAD) ×2 IMPLANT
POUCH AIGIS-R ANTIBACT PPM (Mesh General) ×2 IMPLANT
POUCH AIGIS-R ANTIBACT PPM MED (Mesh General) ×1 IMPLANT
TRAY PACEMAKER INSERTION (PACKS) ×2 IMPLANT

## 2020-12-31 NOTE — Op Note (Signed)
Procedure report  Procedure performed:  1. Biventricular pacemaker (CRT-P) generator changeout  2. Aegis pouch Reason for procedure:  1. Device generator malfunction (under advisory) and elective replacement interval  Procedure performed by:  Sanda Klein, MD  Complications:  None  Estimated blood loss:  <5 mL  Medications administered during procedure:  Vancomycin 1 g intravenously, lidocaine 1% 30 mL locally Device details:   New Generator [facility] [employer] CRT-P model number Z610, serial number [id number] Right atrial lead (chronic) BSC 4285, serial [id number] (implanted 2001) Right ventricular lead (chronic)  BSC 4035, serial number [id number] (implanted 2001) Left ventricular lead  BSC 5621, serial number [id number] (implanted 2008) Explanted generator BSC V273, serial number  [id number] (implanted 11/02/2012)  Procedure details:  After the risks and benefits of the procedure were discussed the patient provided informed consent. She was brought to the cardiac catheter lab in the fasting state. The patient was prepped and draped in usual sterile fashion. Local anesthesia with 1% lidocaine was administered to to the left infraclavicular area. A 5-6cm horizontal incision was made parallel with and 2-3 cm caudal to the right clavicle, in the area of an old scar. Two older scars were seen closer to the left clavicle. Using minimal electrocautery and mostly sharp and blunt dissection the prepectoral pocket was opened carefully to avoid injury to the loops of chronic leads. Extensive dissection was not necessary. The device was explanted. The pocket was carefully inspected for hemostasis and flushed with copious amounts of antibiotic solution.  The leads were disconnected from the old generator and the new generator was connected to the chronic leads, with appropriate pacing noted. Testing of the lead parameters via telemetry showed stable values. The chronic right atrial lead is known to have appropriate  sensing, but prohibitively high pacing threshold (device programmed VDD).  The new generator and leads were placed in an AEGIS pouch.  The entire system was then carefully inserted in the pocket with care taken that the leads and device assumed a comfortable position without pressure on the incision. Great care was taken that the leads be located deep to the generator. The pocket was then closed in layers using 2 layers of 2-0 Vicryl, one layer of 3-0 Vicryl and cutaneous steristrips after which a sterile dressing was applied.   At the end of the procedure the following lead parameters were encountered:   Right atrial lead sensed P waves 0.8 mV, impedance 681 ohms, threshold above limit  Right ventricular lead sensed R waves  none detected (dependent) mV, impedance 768 ohms, threshold 1.4 at 0.4 ms pulse width.  Left ventricular lead impedance 950 ohms, threshold 1.3 at 1.0 ms pulse width.  Sanda Klein, MD, [facility] [phone] office [phone] pager

## 2020-12-31 NOTE — Interval H&P Note (Signed)
History and Physical Interval Note:  12/31/2020 2:38 PM  [name]  has presented today for surgery, with the diagnosis of pacemaker generator malfunction.  The various methods of treatment have been discussed with the patient and family. After consideration of risks, benefits and other options for treatment, the patient has consented to  Procedure(s): PPM GENERATOR CHANGEOUT (N/A) as a surgical intervention.  The patient's history has been reviewed, patient examined, no change in status, stable for surgery.  I have reviewed the patient's chart and labs.  Questions were answered to the patient's satisfaction.     Bettymae Yott

## 2020-12-31 NOTE — Discharge Instructions (Signed)
Supplemental Discharge Instructions for  Pacemaker/Defibrillator Patients  Activity No restrictions. DO wear your seatbelt, even if it crosses over the pacemaker site.  WOUND CARE - Keep the wound area clean and dry.  Remove the dressing the day after you return home (usually 48 hours after the procedure). - DO NOT SUBMERGE UNDER WATER UNTIL FULLY HEALED (no tub baths, hot tubs, swimming pools, etc.).  - You  may shower or take a sponge bath after the dressing is removed. DO NOT SOAK the area and do not allow the shower to directly spray on the site. - If you have staples, these will be removed in the office in 7-14 days. - If you have tape/steri-strips on your wound, these will fall off; do not pull them off prematurely.   - No bandage is needed on the site.  DO  NOT apply any creams, oils, or ointments to the wound area. - If you notice any drainage or discharge from the wound, any swelling, excessive redness or bruising at the site, or if you develop a fever > 101? F after you are discharged home, call the office at once.  Special Instructions - You are still able to use cellular telephones.  Avoid carrying your cellular phone near your device. - When traveling through airports, show security personnel your identification card to avoid being screened in the metal detectors.  - Avoid arc welding equipment, MRI testing (magnetic resonance imaging), TENS units (transcutaneous nerve stimulators).  Call the office for questions about other devices. - Avoid electrical appliances that are in poor condition or are not properly grounded. - Microwave ovens are safe to be near or to operate. 

## 2021-01-01 ENCOUNTER — Telehealth: Payer: Self-pay

## 2021-01-01 ENCOUNTER — Encounter (HOSPITAL_COMMUNITY): Payer: Self-pay | Admitting: Cardiovascular Disease

## 2021-01-01 ENCOUNTER — Telehealth: Payer: Self-pay | Admitting: Cardiovascular Disease

## 2021-01-01 NOTE — Telephone Encounter (Signed)
      I did not need this chart

## 2021-01-01 NOTE — Telephone Encounter (Signed)
     I did not need this chart

## 2021-01-01 NOTE — Telephone Encounter (Signed)
Spoke with pt.  She reports PPM incision from ppm gen changeout yesterday is itching.    Pt has not yet removed outer dressing.  Advised to remove dressing, leaving steri-0strips intact.  Area should improve.  Call if area worsens in any way.

## 2021-01-06 DIAGNOSIS — I5022 Chronic systolic (congestive) heart failure: Secondary | ICD-10-CM | POA: Diagnosis not present

## 2021-01-06 DIAGNOSIS — J9612 Chronic respiratory failure with hypercapnia: Secondary | ICD-10-CM | POA: Diagnosis not present

## 2021-01-06 DIAGNOSIS — J9611 Chronic respiratory failure with hypoxia: Secondary | ICD-10-CM | POA: Diagnosis not present

## 2021-01-07 ENCOUNTER — Emergency Department (HOSPITAL_COMMUNITY): Payer: Medicare Other

## 2021-01-07 ENCOUNTER — Inpatient Hospital Stay (HOSPITAL_COMMUNITY)
Admission: EM | Admit: 2021-01-07 | Discharge: 2021-01-10 | DRG: 291 | Disposition: A | Discharge status: Home Health | Payer: Medicare Other | Attending: Internal Medicine | Admitting: Internal Medicine

## 2021-01-07 DIAGNOSIS — Z743 Need for continuous supervision: Secondary | ICD-10-CM | POA: Diagnosis not present

## 2021-01-07 DIAGNOSIS — Z79899 Other long term (current) drug therapy: Secondary | ICD-10-CM

## 2021-01-07 DIAGNOSIS — K760 Fatty (change of) liver, not elsewhere classified: Secondary | ICD-10-CM | POA: Diagnosis not present

## 2021-01-07 DIAGNOSIS — I442 Atrioventricular block, complete: Secondary | ICD-10-CM | POA: Diagnosis not present

## 2021-01-07 DIAGNOSIS — I2510 Atherosclerotic heart disease of native coronary artery without angina pectoris: Secondary | ICD-10-CM | POA: Diagnosis present

## 2021-01-07 DIAGNOSIS — Z882 Allergy status to sulfonamides status: Secondary | ICD-10-CM

## 2021-01-07 DIAGNOSIS — R079 Chest pain, unspecified: Secondary | ICD-10-CM | POA: Diagnosis not present

## 2021-01-07 DIAGNOSIS — M109 Gout, unspecified: Secondary | ICD-10-CM | POA: Diagnosis not present

## 2021-01-07 DIAGNOSIS — Z95810 Presence of automatic (implantable) cardiac defibrillator: Secondary | ICD-10-CM

## 2021-01-07 DIAGNOSIS — K589 Irritable bowel syndrome without diarrhea: Secondary | ICD-10-CM | POA: Diagnosis present

## 2021-01-07 DIAGNOSIS — G8929 Other chronic pain: Secondary | ICD-10-CM | POA: Diagnosis not present

## 2021-01-07 DIAGNOSIS — E119 Type 2 diabetes mellitus without complications: Secondary | ICD-10-CM | POA: Diagnosis not present

## 2021-01-07 DIAGNOSIS — I130 Hypertensive heart and chronic kidney disease with heart failure and stage 1 through stage 4 chronic kidney disease, or unspecified chronic kidney disease: Principal | ICD-10-CM | POA: Diagnosis present

## 2021-01-07 DIAGNOSIS — I5033 Acute on chronic diastolic (congestive) heart failure: Secondary | ICD-10-CM | POA: Diagnosis present

## 2021-01-07 DIAGNOSIS — R601 Generalized edema: Secondary | ICD-10-CM

## 2021-01-07 DIAGNOSIS — I10 Essential (primary) hypertension: Secondary | ICD-10-CM

## 2021-01-07 DIAGNOSIS — Z888 Allergy status to other drugs, medicaments and biological substances status: Secondary | ICD-10-CM

## 2021-01-07 DIAGNOSIS — R0789 Other chest pain: Secondary | ICD-10-CM | POA: Diagnosis not present

## 2021-01-07 DIAGNOSIS — I5032 Chronic diastolic (congestive) heart failure: Secondary | ICD-10-CM | POA: Diagnosis present

## 2021-01-07 DIAGNOSIS — E2740 Unspecified adrenocortical insufficiency: Secondary | ICD-10-CM | POA: Diagnosis present

## 2021-01-07 DIAGNOSIS — G4733 Obstructive sleep apnea (adult) (pediatric): Secondary | ICD-10-CM | POA: Diagnosis present

## 2021-01-07 DIAGNOSIS — N1831 Chronic kidney disease, stage 3a: Secondary | ICD-10-CM

## 2021-01-07 DIAGNOSIS — Z7984 Long term (current) use of oral hypoglycemic drugs: Secondary | ICD-10-CM

## 2021-01-07 DIAGNOSIS — E7800 Pure hypercholesterolemia, unspecified: Secondary | ICD-10-CM | POA: Diagnosis present

## 2021-01-07 DIAGNOSIS — I517 Cardiomegaly: Secondary | ICD-10-CM | POA: Diagnosis not present

## 2021-01-07 DIAGNOSIS — Z950 Presence of cardiac pacemaker: Secondary | ICD-10-CM | POA: Diagnosis present

## 2021-01-07 DIAGNOSIS — Z7951 Long term (current) use of inhaled steroids: Secondary | ICD-10-CM

## 2021-01-07 DIAGNOSIS — E662 Morbid (severe) obesity with alveolar hypoventilation: Secondary | ICD-10-CM | POA: Diagnosis present

## 2021-01-07 DIAGNOSIS — E669 Obesity, unspecified: Secondary | ICD-10-CM

## 2021-01-07 DIAGNOSIS — N183 Chronic kidney disease, stage 3 unspecified: Secondary | ICD-10-CM | POA: Diagnosis not present

## 2021-01-07 DIAGNOSIS — R6889 Other general symptoms and signs: Secondary | ICD-10-CM | POA: Diagnosis not present

## 2021-01-07 DIAGNOSIS — Z881 Allergy status to other antibiotic agents status: Secondary | ICD-10-CM

## 2021-01-07 DIAGNOSIS — I509 Heart failure, unspecified: Secondary | ICD-10-CM | POA: Diagnosis not present

## 2021-01-07 DIAGNOSIS — I69354 Hemiplegia and hemiparesis following cerebral infarction affecting left non-dominant side: Secondary | ICD-10-CM

## 2021-01-07 DIAGNOSIS — Z981 Arthrodesis status: Secondary | ICD-10-CM

## 2021-01-07 DIAGNOSIS — Z91041 Radiographic dye allergy status: Secondary | ICD-10-CM

## 2021-01-07 DIAGNOSIS — E1122 Type 2 diabetes mellitus with diabetic chronic kidney disease: Secondary | ICD-10-CM | POA: Diagnosis present

## 2021-01-07 DIAGNOSIS — Z6841 Body Mass Index (BMI) 40.0 and over, adult: Secondary | ICD-10-CM

## 2021-01-07 DIAGNOSIS — Z7982 Long term (current) use of aspirin: Secondary | ICD-10-CM

## 2021-01-07 DIAGNOSIS — J45909 Unspecified asthma, uncomplicated: Secondary | ICD-10-CM | POA: Diagnosis not present

## 2021-01-07 DIAGNOSIS — K219 Gastro-esophageal reflux disease without esophagitis: Secondary | ICD-10-CM | POA: Diagnosis present

## 2021-01-07 DIAGNOSIS — M1990 Unspecified osteoarthritis, unspecified site: Secondary | ICD-10-CM | POA: Diagnosis present

## 2021-01-07 DIAGNOSIS — Z91013 Allergy to seafood: Secondary | ICD-10-CM

## 2021-01-07 DIAGNOSIS — Z833 Family history of diabetes mellitus: Secondary | ICD-10-CM

## 2021-01-07 DIAGNOSIS — J811 Chronic pulmonary edema: Secondary | ICD-10-CM | POA: Diagnosis not present

## 2021-01-07 DIAGNOSIS — Z825 Family history of asthma and other chronic lower respiratory diseases: Secondary | ICD-10-CM | POA: Diagnosis not present

## 2021-01-07 DIAGNOSIS — Z951 Presence of aortocoronary bypass graft: Secondary | ICD-10-CM

## 2021-01-07 DIAGNOSIS — Z86010 Personal history of colonic polyps: Secondary | ICD-10-CM

## 2021-01-07 DIAGNOSIS — Z20822 Contact with and (suspected) exposure to covid-19: Secondary | ICD-10-CM | POA: Diagnosis not present

## 2021-01-07 DIAGNOSIS — N189 Chronic kidney disease, unspecified: Secondary | ICD-10-CM | POA: Diagnosis present

## 2021-01-07 DIAGNOSIS — Z8249 Family history of ischemic heart disease and other diseases of the circulatory system: Secondary | ICD-10-CM

## 2021-01-07 DIAGNOSIS — I25810 Atherosclerosis of coronary artery bypass graft(s) without angina pectoris: Secondary | ICD-10-CM | POA: Diagnosis not present

## 2021-01-07 DIAGNOSIS — Z87891 Personal history of nicotine dependence: Secondary | ICD-10-CM

## 2021-01-07 DIAGNOSIS — I5031 Acute diastolic (congestive) heart failure: Secondary | ICD-10-CM | POA: Diagnosis present

## 2021-01-07 DIAGNOSIS — M545 Low back pain, unspecified: Secondary | ICD-10-CM | POA: Diagnosis present

## 2021-01-07 DIAGNOSIS — E876 Hypokalemia: Secondary | ICD-10-CM | POA: Diagnosis not present

## 2021-01-07 DIAGNOSIS — I5042 Chronic combined systolic (congestive) and diastolic (congestive) heart failure: Secondary | ICD-10-CM | POA: Diagnosis present

## 2021-01-07 DIAGNOSIS — Z9981 Dependence on supplemental oxygen: Secondary | ICD-10-CM

## 2021-01-07 DIAGNOSIS — N186 End stage renal disease: Secondary | ICD-10-CM | POA: Diagnosis present

## 2021-01-07 DIAGNOSIS — E1169 Type 2 diabetes mellitus with other specified complication: Secondary | ICD-10-CM

## 2021-01-07 LAB — COMPREHENSIVE METABOLIC PANEL
ALT: 14 U/L (ref 0–44)
AST: 15 U/L (ref 15–41)
Albumin: 2.8 g/dL — ABNORMAL LOW (ref 3.5–5.0)
Alkaline Phosphatase: 105 U/L (ref 38–126)
Anion gap: 7 (ref 5–15)
BUN: 18 mg/dL (ref 8–23)
CO2: 35 mmol/L — ABNORMAL HIGH (ref 22–32)
Calcium: 8.7 mg/dL — ABNORMAL LOW (ref 8.9–10.3)
Chloride: 97 mmol/L — ABNORMAL LOW (ref 98–111)
Creatinine, Ser: 1.51 mg/dL — ABNORMAL HIGH (ref 0.44–1.00)
GFR, Estimated: 36 mL/min — ABNORMAL LOW (ref >60)
Glucose, Bld: 131 mg/dL — ABNORMAL HIGH (ref 70–99)
Potassium: 3.4 mmol/L — ABNORMAL LOW (ref 3.5–5.1)
Sodium: 139 mmol/L (ref 135–145)
Total Bilirubin: 0.5 mg/dL (ref 0.3–1.2)
Total Protein: 6.2 g/dL — ABNORMAL LOW (ref 6.5–8.1)

## 2021-01-07 LAB — CBC
HCT: 36.5 % (ref 36.0–46.0)
Hemoglobin: 10.8 g/dL — ABNORMAL LOW (ref 12.0–15.0)
MCH: 29.4 pg (ref 26.0–34.0)
MCHC: 29.6 g/dL — ABNORMAL LOW (ref 30.0–36.0)
MCV: 99.5 fL (ref 80.0–100.0)
Platelets: 164 10*3/uL (ref 150–400)
RBC: 3.67 MIL/uL — ABNORMAL LOW (ref 3.87–5.11)
RDW: 14.6 % (ref 11.5–15.5)
WBC: 5.7 10*3/uL (ref 4.0–10.5)
nRBC: 0 % (ref 0.0–0.2)

## 2021-01-07 LAB — CBC WITH DIFFERENTIAL/PLATELET
Abs Immature Granulocytes: 0.02 10*3/uL (ref 0.00–0.07)
Basophils Absolute: 0 10*3/uL (ref 0.0–0.1)
Basophils Relative: 1 %
Eosinophils Absolute: 0.2 10*3/uL (ref 0.0–0.5)
Eosinophils Relative: 4 %
HCT: 36.2 % (ref 36.0–46.0)
Hemoglobin: 10.8 g/dL — ABNORMAL LOW (ref 12.0–15.0)
Immature Granulocytes: 0 %
Lymphocytes Relative: 19 %
Lymphs Abs: 1.1 10*3/uL (ref 0.7–4.0)
MCH: 29.4 pg (ref 26.0–34.0)
MCHC: 29.8 g/dL — ABNORMAL LOW (ref 30.0–36.0)
MCV: 98.6 fL (ref 80.0–100.0)
Monocytes Absolute: 0.5 10*3/uL (ref 0.1–1.0)
Monocytes Relative: 9 %
Neutro Abs: 3.7 10*3/uL (ref 1.7–7.7)
Neutrophils Relative %: 67 %
Platelets: 157 10*3/uL (ref 150–400)
RBC: 3.67 MIL/uL — ABNORMAL LOW (ref 3.87–5.11)
RDW: 14.6 % (ref 11.5–15.5)
WBC: 5.5 10*3/uL (ref 4.0–10.5)
nRBC: 0 % (ref 0.0–0.2)

## 2021-01-07 LAB — TROPONIN I (HIGH SENSITIVITY)
Troponin I (High Sensitivity): 20 ng/L — ABNORMAL HIGH (ref <18)
Troponin I (High Sensitivity): 17 ng/L (ref <18)

## 2021-01-07 LAB — CBG MONITORING, ED
Glucose-Capillary: 175 mg/dL — ABNORMAL HIGH (ref 70–99)
Glucose-Capillary: 143 mg/dL — ABNORMAL HIGH (ref 70–99)

## 2021-01-07 LAB — CREATININE, SERUM
Creatinine, Ser: 1.35 mg/dL — ABNORMAL HIGH (ref 0.44–1.00)
GFR, Estimated: 41 mL/min — ABNORMAL LOW (ref >60)

## 2021-01-07 LAB — RESP PANEL BY RT-PCR (FLU A&B, COVID) ARPGX2
Influenza A by PCR: NEGATIVE
Influenza B by PCR: NEGATIVE
SARS Coronavirus 2 by RT PCR: NEGATIVE

## 2021-01-07 LAB — BRAIN NATRIURETIC PEPTIDE: B Natriuretic Peptide: 338.7 pg/mL — ABNORMAL HIGH (ref 0.0–100.0)

## 2021-01-07 MED ORDER — ASPIRIN EC 81 MG PO TBEC
81.0000 mg | DELAYED_RELEASE_TABLET | Freq: Every day | ORAL | Status: DC
  Administered 2021-01-07 – 2021-01-10 (×4): 81 mg via ORAL
  Filled 2021-01-07 (×4): qty 1

## 2021-01-07 MED ORDER — HEPARIN SODIUM (PORCINE) 5000 UNIT/ML IJ SOLN
5000.0000 [IU] | Freq: Three times a day (TID) | INTRAMUSCULAR | Status: DC
  Administered 2021-01-07 – 2021-01-10 (×10): 5000 [IU] via SUBCUTANEOUS
  Filled 2021-01-07 (×10): qty 1

## 2021-01-07 MED ORDER — SODIUM CHLORIDE 0.9% FLUSH
3.0000 mL | Freq: Two times a day (BID) | INTRAVENOUS | Status: DC
  Administered 2021-01-07 – 2021-01-10 (×7): 3 mL via INTRAVENOUS

## 2021-01-07 MED ORDER — SODIUM CHLORIDE 0.9 % IV SOLN: 250.0000 mL | INTRAVENOUS | Status: DC | PRN

## 2021-01-07 MED ORDER — INSULIN ASPART 100 UNIT/ML IJ SOLN
0.0000 [IU] | Freq: Three times a day (TID) | INTRAMUSCULAR | Status: DC
  Administered 2021-01-07: 17:00:00 3 [IU] via SUBCUTANEOUS
  Administered 2021-01-08 (×3): 2 [IU] via SUBCUTANEOUS

## 2021-01-07 MED ORDER — EMPAGLIFLOZIN 10 MG PO TABS
10.0000 mg | ORAL_TABLET | Freq: Every day | ORAL | Status: DC
  Administered 2021-01-08 – 2021-01-10 (×3): 10 mg via ORAL
  Filled 2021-01-07 (×3): qty 1

## 2021-01-07 MED ORDER — ALBUTEROL SULFATE (2.5 MG/3ML) 0.083% IN NEBU
2.5000 mg | INHALATION_SOLUTION | Freq: Four times a day (QID) | RESPIRATORY_TRACT | Status: DC
  Administered 2021-01-07 – 2021-01-08 (×3): 2.5 mg via RESPIRATORY_TRACT
  Filled 2021-01-07 (×4): qty 3

## 2021-01-07 MED ORDER — PANTOPRAZOLE SODIUM 40 MG PO TBEC
40.0000 mg | DELAYED_RELEASE_TABLET | Freq: Two times a day (BID) | ORAL | Status: DC
  Administered 2021-01-07 – 2021-01-10 (×7): 40 mg via ORAL
  Filled 2021-01-07 (×7): qty 1

## 2021-01-07 MED ORDER — MOMETASONE FURO-FORMOTEROL FUM 200-5 MCG/ACT IN AERO
2.0000 | INHALATION_SPRAY | Freq: Two times a day (BID) | RESPIRATORY_TRACT | Status: DC
  Administered 2021-01-08 – 2021-01-10 (×4): 2 via RESPIRATORY_TRACT
  Filled 2021-01-07 (×2): qty 8.8

## 2021-01-07 MED ORDER — IPRATROPIUM BROMIDE 0.02 % IN SOLN
0.5000 mg | Freq: Four times a day (QID) | RESPIRATORY_TRACT | Status: DC
Start: 2021-01-07 — End: 2021-01-08
  Administered 2021-01-07 – 2021-01-08 (×2): 0.5 mg via RESPIRATORY_TRACT
  Filled 2021-01-07 (×4): qty 2.5

## 2021-01-07 MED ORDER — ONDANSETRON HCL 4 MG/2ML IJ SOLN: 4.0000 mg | Freq: Four times a day (QID) | INTRAMUSCULAR | Status: DC | PRN

## 2021-01-07 MED ORDER — ACETAMINOPHEN 325 MG PO TABS
650.0000 mg | ORAL_TABLET | ORAL | Status: DC | PRN
  Administered 2021-01-08 – 2021-01-10 (×5): 650 mg via ORAL
  Filled 2021-01-07 (×6): qty 2

## 2021-01-07 MED ORDER — BISOPROLOL FUMARATE 5 MG PO TABS
5.0000 mg | ORAL_TABLET | Freq: Every day | ORAL | Status: DC
  Administered 2021-01-07: 16:00:00 5 mg via ORAL
  Filled 2021-01-07: qty 1

## 2021-01-07 MED ORDER — ROSUVASTATIN CALCIUM 20 MG PO TABS
40.0000 mg | ORAL_TABLET | Freq: Every evening | ORAL | Status: DC
  Administered 2021-01-07 – 2021-01-09 (×3): 40 mg via ORAL
  Filled 2021-01-07 (×4): qty 2

## 2021-01-07 MED ORDER — BISOPROLOL FUMARATE 5 MG PO TABS
10.0000 mg | ORAL_TABLET | Freq: Every day | ORAL | Status: DC
  Administered 2021-01-08 – 2021-01-10 (×3): 10 mg via ORAL
  Filled 2021-01-07: qty 2
  Filled 2021-01-07: qty 1
  Filled 2021-01-07: qty 2

## 2021-01-07 MED ORDER — ALBUTEROL SULFATE (2.5 MG/3ML) 0.083% IN NEBU: 2.5000 mg | INHALATION_SOLUTION | Freq: Four times a day (QID) | RESPIRATORY_TRACT | Status: DC | PRN

## 2021-01-07 MED ORDER — ISOSORBIDE MONONITRATE ER 60 MG PO TB24
90.0000 mg | ORAL_TABLET | Freq: Every day | ORAL | Status: DC
  Administered 2021-01-07 – 2021-01-10 (×4): 90 mg via ORAL
  Filled 2021-01-07: qty 1
  Filled 2021-01-07 (×2): qty 3
  Filled 2021-01-07: qty 1

## 2021-01-07 MED ORDER — HYDRALAZINE HCL 20 MG/ML IJ SOLN: 10.0000 mg | Freq: Three times a day (TID) | INTRAMUSCULAR | Status: DC | PRN

## 2021-01-07 MED ORDER — CLOPIDOGREL BISULFATE 75 MG PO TABS
75.0000 mg | ORAL_TABLET | Freq: Every day | ORAL | Status: DC
  Administered 2021-01-07 – 2021-01-10 (×4): 75 mg via ORAL
  Filled 2021-01-07 (×4): qty 1

## 2021-01-07 MED ORDER — RANOLAZINE ER 500 MG PO TB12
1000.0000 mg | ORAL_TABLET | Freq: Two times a day (BID) | ORAL | Status: DC
  Administered 2021-01-07 – 2021-01-10 (×6): 1000 mg via ORAL
  Filled 2021-01-07 (×6): qty 2

## 2021-01-07 MED ORDER — FUROSEMIDE 10 MG/ML IJ SOLN
80.0000 mg | Freq: Two times a day (BID) | INTRAMUSCULAR | Status: DC
  Administered 2021-01-07 – 2021-01-09 (×4): 80 mg via INTRAVENOUS
  Filled 2021-01-07 (×4): qty 8

## 2021-01-07 MED ORDER — ACETAMINOPHEN 325 MG PO TABS
650.0000 mg | ORAL_TABLET | Freq: Once | ORAL | Status: AC
  Administered 2021-01-07: 06:00:00 650 mg via ORAL
  Filled 2021-01-07: qty 2

## 2021-01-07 MED ORDER — SODIUM CHLORIDE 0.9% FLUSH: 3.0000 mL | INTRAVENOUS | Status: DC | PRN

## 2021-01-07 MED ORDER — POTASSIUM CHLORIDE CRYS ER 20 MEQ PO TBCR
60.0000 meq | EXTENDED_RELEASE_TABLET | Freq: Once | ORAL | Status: AC
  Administered 2021-01-07: 16:00:00 60 meq via ORAL
  Filled 2021-01-07: qty 3

## 2021-01-07 MED ORDER — ALBUTEROL SULFATE HFA 108 (90 BASE) MCG/ACT IN AERS: 2.0000 | INHALATION_SPRAY | Freq: Four times a day (QID) | RESPIRATORY_TRACT | Status: DC

## 2021-01-07 MED ORDER — FERROUS SULFATE 325 (65 FE) MG PO TABS
325.0000 mg | ORAL_TABLET | Freq: Every day | ORAL | Status: DC
  Administered 2021-01-07 – 2021-01-10 (×4): 325 mg via ORAL
  Filled 2021-01-07 (×4): qty 1

## 2021-01-07 NOTE — ED Triage Notes (Signed)
Pt arrives via [facility] for CP and SOB for one day. Pt states it woke her up from her sleep and she decided to call 911. Pt given 2 SL Nitro by EMS with complete pain relief. Pt took 324 ASA prior to EMS arrival. Pt had pacemaker replaced approximately one week ago.   EMS last VS - 145/84, HR 70 (paced), RR 18, 98% on 3 lpm via [place], CBG 154.

## 2021-01-07 NOTE — ED Provider Notes (Signed)
Care of the patient assumed at signout.  She was in no distress.  She was seen and evaluated by our cardiology colleagues, admitted for further monitoring, management.   Carmin Muskrat, MD 01/07/21 1425

## 2021-01-07 NOTE — ED Provider Notes (Signed)
[facility] EMERGENCY DEPARTMENT Provider Note   CSN: [id number] Arrival date & time: 01/07/21  0403     History Chief Complaint  Patient presents with   Chest Pain   Shortness of Breath    [name] is a 75 y.o. female.  The history is provided by the patient and medical records.  Chest Pain Associated symptoms: shortness of breath   Shortness of Breath Associated symptoms: chest pain   [name] is a 75 y.o. female who presents to the Emergency Department complaining of chest pain.  She presents to the ED for evaluation of sharp, left sided chest pain that started Saturday night.  Pain has woken her from sleep.  Pain radiates to the back.  Pain lasts for a few minutes at a time.  Has associated SOB/DOE.  She is on 3L [place] at baseline.    No fever, cough.  Has associated generalized abdominal pain that has been going on for the last few weeks - scheduled to see doctor regarding AP later today.  No vomiting, diarrhea.  Has BLE edema.    Lives with son, but he works during the day.      Past Medical History:  Diagnosis Date   ABDOMINAL PAIN -GENERALIZED 02/21/2010   Qualifier: Diagnosis of  By: Chester Holstein NP, Nevin Bloodgood     AICD (automatic cardioverter/defibrillator) present    downgraded to CRT-P in 2014   Anemia    Arthritis    Asthma    Atrial fibrillation ([place]) 10/24/2015   Questionable history of A Fib/A Flutter. On device interrogation on 9/7, showed 0.55min of A Fib/A Flutter with 1% burden.  Device check in Jan 2018 showed no sustained AF (runs of less than 30 seconds). No anticoagulation indicated.   CAD (coronary artery disease)    a. 10/09/16 LHC: SVG->LAD patent, SVG->Diag patent, SVG->RCA patent, SVG->LCx occluded. EF 60%, b. 10/31/16 LHC DES to AV groove Circ, DES to intermed branch   Cellulitis and abscess of foot 12/2014   RT FOOT   CHF (congestive heart failure) (HCC)    Chronic bronchitis (HCC)    Chronic lower back pain     Complete heart block ([place]) 06/22/2015   Diverticulosis    Facial numbness 02/2016   Fatty liver disease, nonalcoholic    Gastritis    GERD (gastroesophageal reflux disease)    Gout    H/O hiatal hernia    HEMORRHOIDS-EXTERNAL 02/21/2010   Qualifier: Diagnosis of  By: Chester Holstein NP, Paula     High cholesterol    Hyperplastic colon polyp    Hypertension    IBS (irritable bowel syndrome)    Internal hemorrhoids    INTERNAL HEMORRHOIDS WITHOUT MENTION COMP 04/12/2007   Qualifier: Diagnosis of  By: Olevia Perches MD, Dora M    Nonischemic cardiomyopathy ([place]) 11/22/2011   Pt responded to BiV ICD- last EF 55-60% Sept 2017   Obesity    On home oxygen therapy    "2L at night" (10/31/2016)   OSA (obstructive sleep apnea)    "can't tolerate a mask" (10/30/2016)   PERSONAL HX COLONIC POLYPS 02/21/2010   Qualifier: Diagnosis of  By: Chester Holstein NP, Paula     Pneumonia    "couple times" (10/31/2016)   Presence of permanent cardiac pacemaker    11/02/12 [employer] V273 INTUA PPM   Right facial numbness 03/05/2016   Shortness of breath    Stroke ([place]) 09/2020   TIA (transient ischemic attack)    "recently" (10/31/2016)  Type II diabetes mellitus [facility])     Patient Active Problem List   Diagnosis Date Noted   Chronic diastolic heart failure ([place]) 12/31/2020   Carotid stenosis 10/01/2020   Ischemic cardiomyopathy 09/27/2020   Aneurysm ([place]) 09/19/2020   Acute left-sided weakness 09/16/2020   Blood blister 08/01/2020   Elevated troponin 06/01/2019   Constipation 10/21/2018   Chronic headaches 07/30/2018   Congestive heart failure (CHF) ([place]) 07/30/2018   Supplemental oxygen dependent 07/30/2018   Leukopenia 05/03/2018   Thrombocytopenia ([place]) 05/03/2018   Pneumonia due to human metapneumovirus 05/02/2018   Chest pain 05/01/2018   Allergic rhinitis 04/13/2018   S/P laparoscopic cholecystectomy 04/09/2018   Chronic respiratory failure with hypoxia and hypercapnia ([place]) 03/07/2018   Polyp  of cecum    Polyp of transverse colon    Positive colorectal cancer screening using Cologuard test    Dysphagia    Acute on chronic renal failure ([place]) 11/20/2017   CAD (coronary artery disease) 11/20/2017   DM2 (diabetes mellitus, type 2) ([place]) 11/20/2017   Abdominal pain, epigastric 11/20/2017   Vasomotor rhinitis 10/16/2016   Right facial numbness 03/05/2016   CKD (chronic kidney disease), stage III ([place]) 03/05/2016   Atrial fibrillation ([place]) 10/24/2015   Chest pain with moderate risk of acute coronary syndrome 10/23/2015   History of TIA (transient ischemic attack) 10/22/2015   CHB (complete heart block) ([place]) 06/22/2015   Allergic drug rash due to anti-infective agent 04/29/2015   Fatigue 02/14/2015   Chronic diastolic CHF (congestive heart failure) ([place]) 02/14/2015   Cellulitis 12/21/2014   S/P CABG x 5 11/29/2014   CAD S/P percutaneous coronary angioplasty    Diarrhea 07/12/2014   Generalized abdominal pain 09/32/6712   Diastolic CHF, acute on chronic ([place]) 11/04/2013   Mixed hypercholesterolemia and hypertriglyceridemia 04/22/2013   Vertigo 04/22/2013   Dyspnea on exertion 08/25/2012   Allergic to IV contrast 07/23/2012   Unstable angina (HCC) 07/22/2012   Morbid (severe) obesity due to excess calories ([place]) 11/22/2011   Gastroesophageal reflux disease 11/22/2011   Back pain 11/22/2011   OSA (obstructive sleep apnea) 11/22/2011   HEMORRHOIDS-EXTERNAL 02/21/2010   NAUSEA 02/21/2010   ABDOMINAL PAIN -GENERALIZED 02/21/2010   PERSONAL HX COLONIC POLYPS 02/21/2010   ANEMIA 04/16/2007   Essential hypertension 04/16/2007   DIVERTICULOSIS, COLON 04/16/2007   ARTHRITIS 04/16/2007   INTERNAL HEMORRHOIDS WITHOUT MENTION COMP 04/12/2007   Asthma 04/12/2007   Cardiac resynchronization therapy pacemaker (CRT-P) in place 04/12/2007   Gastritis without bleeding 12/14/2006   COLONIC POLYPS, HYPERPLASTIC 09/27/2002   FATTY LIVER DISEASE 02/16/2002    Past Surgical History:   Procedure Laterality Date   ABDOMINAL ULTRASOUND  12/01/2011   Peripelvic cysts- #1- 2.4x1.9x2.3cm, #2-1.2x0.9x1.2cm   ANKLE FRACTURE SURGERY Right    "had rod put in"   ANTERIOR CERVICAL DECOMP/DISCECTOMY FUSION     APPENDECTOMY     BACK SURGERY     BIOPSY  12/29/2017   Procedure: BIOPSY;  Surgeon: [name], MD;  Location: [facility];  Service: Endoscopy;;   BIV ICD INSERTION CRT-D  2001?   BIV PACEMAKER GENERATOR CHANGE OUT N/A 11/02/2012   Procedure: BIV PACEMAKER GENERATOR CHANGE OUT;  Surgeon: Sanda Klein, MD;  Location: [facility];  Service: Cardiovascular;  Laterality: N/A;   CARDIAC CATHETERIZATION  05/17/1999   No significant coronary obstructive disease w/ mild 20% luminal irregularity of the first diag branch of the LAD   CARDIAC CATHETERIZATION  07/08/2002   No significant CAD, moderately depressed LV systolic function  CARDIAC CATHETERIZATION Bilateral 04/26/2007   Normal findings, recommend medical therapy   CARDIAC CATHETERIZATION  02/18/2008   Moderate CAD, would benefit from having a functional study, recommend continue medical therapy   CARDIAC CATHETERIZATION  07/23/2012   Medical therapy   CARDIAC CATHETERIZATION N/A 11/24/2014   Procedure: Left Heart Cath and Coronary Angiography;  Surgeon: Troy Sine, MD;  Location: [place] CV LAB;  Service: Cardiovascular;  Laterality: N/A;   CARDIAC CATHETERIZATION  11/27/2014   Procedure: Intravascular Pressure Wire/FFR Study;  Surgeon: Peter M Martinique, MD;  Location: [place] CV [facility];  Service: Cardiovascular;;   CARDIAC CATHETERIZATION  10/09/2016   CHOLECYSTECTOMY N/A 04/09/2018   Procedure: LAPAROSCOPIC CHOLECYSTECTOMY;  Surgeon: Greer Pickerel, MD;  [facility];  Service: General;  Laterality: N/A;   COLONOSCOPY WITH PROPOFOL N/A 12/29/2017   Procedure: COLONOSCOPY WITH PROPOFOL;  Surgeon: [name], MD;  [facility];  Service: Endoscopy;  Laterality: N/A;   CORONARY ANGIOGRAM   2010   CORONARY ARTERY BYPASS GRAFT N/A 11/29/2014   Procedure: CORONARY ARTERY BYPASS GRAFTING x 5 (LIMA-LAD, SVG-D, SVG-OM1-OM2, SVG-PD);  Surgeon: Melrose Nakayama, MD;  Location: [facility];  Service: Open Heart Surgery;  Laterality: N/A;   CORONARY STENT INTERVENTION N/A 10/31/2016   Procedure: CORONARY STENT INTERVENTION;  Surgeon: Burnell Blanks, MD;  Location: [place] CV LAB;  Service: Cardiovascular;  Laterality: N/A;   ESOPHAGOGASTRODUODENOSCOPY (EGD) WITH PROPOFOL N/A 12/29/2017   Procedure: ESOPHAGOGASTRODUODENOSCOPY (EGD) WITH PROPOFOL;  Surgeon: [name], MD;  [facility];  Service: Endoscopy;  Laterality: N/A;   FRACTURE SURGERY     INSERT / REPLACE / [place] ARTHROSCOPY Bilateral    LEFT HEART CATH AND CORS/GRAFTS ANGIOGRAPHY N/A 12/09/2017   Procedure: LEFT HEART CATH AND CORS/GRAFTS ANGIOGRAPHY;  Surgeon: Troy Sine, MD;  Location: [place] [facility];  Service: Cardiovascular;  Laterality: N/A;   LEFT HEART CATHETERIZATION WITH CORONARY ANGIOGRAM N/A 07/23/2012   Procedure: LEFT HEART CATHETERIZATION WITH CORONARY ANGIOGRAM;  Surgeon: Leonie Man, MD;  Location: [facility] CATH LAB;  Service: Cardiovascular;  Laterality: N/A;   LEXISCAN MYOVIEW  11/14/2011   Mild-moderate defect seen in Mid Inferolateral and Mid Anterolateral regions-consistant w/ infarct/scar. No significant ischemia demonstrated.   POLYPECTOMY  12/29/2017   Procedure: POLYPECTOMY;  Surgeon: [name], MD;  Location: [facility];  Service: Endoscopy;;   PPM GENERATOR CHANGEOUT N/A 12/31/2020   Procedure: PPM GENERATOR CHANGEOUT;  Surgeon: Sanda Klein, MD;  Location: [place] [facility];  Service: Cardiovascular;  Laterality: N/A;   RIGHT/LEFT HEART CATH AND CORONARY ANGIOGRAPHY N/A 10/09/2016   Procedure: RIGHT/LEFT HEART CATH AND CORONARY ANGIOGRAPHY;  Surgeon: Jolaine Artist, MD;  Location: [place] [facility];  Service: Cardiovascular;   Laterality: N/A;   RIGHT/LEFT HEART CATH AND CORONARY/GRAFT ANGIOGRAPHY N/A 03/11/2019   Procedure: RIGHT/LEFT HEART CATH AND CORONARY/GRAFT ANGIOGRAPHY;  Surgeon: Jolaine Artist, MD;  Location: [place] [facility];  Service: Cardiovascular;  Laterality: N/A;   TEE WITHOUT CARDIOVERSION N/A 11/29/2014   Procedure: TRANSESOPHAGEAL ECHOCARDIOGRAM (TEE);  Surgeon: Melrose Nakayama, MD;  Location: [facility];  Service: Open Heart Surgery;  Laterality: N/A;   TRANSCAROTID ARTERY REVASCULARIZATION  Right 10/01/2020   Procedure: RIGHT TRANSCAROTID ARTERY REVASCULARIZATION;  Surgeon: [name], MD;  Location: [facility];  Service: Vascular;  Laterality: Right;   TRANSTHORACIC ECHOCARDIOGRAM  07/23/2012   EF 55-60%, normal-mild   TUBAL LIGATION     ULTRASOUND GUIDANCE  FOR VASCULAR ACCESS Left 10/01/2020   Procedure: ULTRASOUND GUIDANCE FOR VASCULAR ACCESS;  Surgeon: [name], MD;  Location: [facility];  Service: Vascular;  Laterality: Left;     OB History   No obstetric history on file.     Family History  Problem Relation Age of Onset   Breast cancer Mother    Diabetes Mother    Heart disease Maternal Grandmother    Kidney disease Maternal Grandmother    Diabetes Maternal Grandmother    Glaucoma Maternal Aunt    Heart disease Maternal Aunt    Asthma Sister    Colon cancer Neg Hx    Stomach cancer Neg Hx    Pancreatic cancer Neg Hx     Social History   Tobacco Use   Smoking status: Former    Packs/day: 0.25    Years: 3.00    Pack years: 0.75    Types: Cigarettes    Quit date: 02/11/1968    Years since quitting: 52.9   Smokeless tobacco: Never  Vaping Use   Vaping Use: Never used  Substance Use Topics   Alcohol use: No   Drug use: No    Home Medications Prior to Admission medications   Medication Sig Start Date End Date Taking? Authorizing Provider  ACCU-CHEK AVIVA PLUS test strip Check blood sugar twice daily 10/19/19  Yes [provider]  Accu-Chek  Softclix Lancets lancets Check blood sugar twice daily 08/10/19  Yes [provider]  acetaminophen (TYLENOL) 500 MG tablet Take 1,000 mg by mouth 2 (two) times daily as needed (for pain or headaches).   Yes [provider]  albuterol (PROVENTIL HFA;VENTOLIN HFA) 108 (90 Base) MCG/ACT inhaler Inhale 2 puffs into the lungs every 6 (six) hours as needed for wheezing or shortness of breath. 05/02/18  Yes Fuller Plan A, MD  albuterol (PROVENTIL) (2.5 MG/3ML) 0.083% nebulizer solution Take 3 mLs (2.5 mg total) by nebulization every 6 (six) hours as needed for wheezing or shortness of breath. 05/02/18  Yes Fuller Plan A, MD  Alcohol Swabs (B-D SINGLE USE SWABS REGULAR) PADS  08/10/19  Yes [provider]  allopurinol (ZYLOPRIM) 100 MG tablet TAKE 1 TABLET BY MOUTH  DAILY Patient taking differently: Take 100 mg by mouth daily. 04/23/20  Yes Croitoru, [name], MD  aspirin EC 81 MG tablet Take 1 tablet (81 mg total) by mouth daily. 03/27/16  Yes Kilroy, Luke K, PA-C  budesonide-formoterol (SYMBICORT) 160-4.5 MCG/ACT inhaler Inhale 2 puffs into the lungs 2 (two) times daily.   Yes [provider]  carboxymethylcellulose (REFRESH PLUS) 0.5 % SOLN Place 1 drop into both eyes every 12 (twelve) hours.   Yes [provider]  clopidogrel (PLAVIX) 75 MG tablet Take 1 tablet (75 mg total) by mouth daily. Patient taking differently: Take 75 mg by mouth at bedtime. 03/06/20  Yes Croitoru, [name], MD  cyclobenzaprine (FLEXERIL) 10 MG tablet Take 5 mg by mouth 2 (two) times daily as needed for muscle spasms. 12/26/20  Yes [provider]  Docusate Calcium (STOOL SOFTENER PO) Take 1 tablet by mouth at bedtime.   Yes [provider]  empagliflozin (JARDIANCE) 10 MG TABS tablet Take 10 mg by mouth daily before breakfast. 06/30/19  Yes Bensimhon, Shaune Pascal, MD  EPINEPHrine 0.3 mg/0.3 mL IJ SOAJ injection Inject 0.3 mg into the muscle once as needed for anaphylaxis  (severe allergic reaction).   Yes [provider]  ferrous sulfate 325 (65 FE) MG tablet Take 325 mg  by mouth daily with breakfast.   Yes [provider]  fluticasone (FLONASE) 50 MCG/ACT nasal spray Place 1 spray into both nostrils daily. 03/22/20  Yes [provider]  ipratropium (ATROVENT) 0.02 % nebulizer solution Take 2.5 mLs (0.5 mg total) by nebulization 4 (four) times daily. Patient taking differently: Take 0.5 mg by nebulization every 4 (four) hours as needed (Asthma). 05/02/18  Yes Fuller Plan A, MD  isosorbide mononitrate (IMDUR) 30 MG 24 hr tablet Take 90 mg by mouth daily.   Yes [provider]  loratadine (CLARITIN) 10 MG tablet Take 10 mg by mouth daily. 11/23/19  Yes [provider]  nitroGLYCERIN (NITROSTAT) 0.4 MG SL tablet Place 1 tablet (0.4 mg total) under the tongue every 5 (five) minutes as needed for chest pain. 11/13/16 01/07/21 Yes Kilroy, Luke K, PA-C  nystatin cream (MYCOSTATIN) Apply 1 application topically 2 (two) times daily as needed (rash). 06/14/20  Yes [provider]  OXYGEN Inhale 3 L/min into the lungs continuous.    Yes [provider]  pantoprazole (PROTONIX) 40 MG tablet Take 1 tablet (40 mg total) by mouth 2 (two) times daily. 07/08/19  Yes Nandigam, Venia Minks, MD  polyethylene glycol (MIRALAX / GLYCOLAX) 17 g packet Take 17 g by mouth daily.   Yes [provider]  potassium chloride SA (KLOR-CON) 20 MEQ tablet Take 20 mEq by mouth every morning.   Yes [provider]  ranolazine (RANEXA) 1000 MG SR tablet TAKE 1 TABLET BY MOUTH  TWICE DAILY Patient taking differently: 1,000 mg 2 (two) times daily. 07/12/20  Yes Croitoru, [name], MD  rosuvastatin (CRESTOR) 40 MG tablet Take 1 tablet (40 mg total) by mouth every evening. NEED OV. 09/19/20  Yes [name], Laxman, MD  sucralfate (CARAFATE) 1 g tablet Take 1 tablet (1 g total) by mouth in the morning, at noon, and at bedtime. As  needed Patient taking differently: Take 1 g by mouth at bedtime. 12/05/20  Yes Nandigam, Venia Minks, MD  torsemide (DEMADEX) 20 MG tablet Take 1 tablet (20 mg total) by mouth daily. Patient taking differently: Take 20 mg by mouth 2 (two) times daily. 04/25/20  Yes Croitoru, [name], MD    Allergies    Ivp dye [iodinated diagnostic agents], Shellfish allergy, Sulfa antibiotics, Iodine, Atorvastatin, Benadryl [diphenhydramine], Colchicine, Contrast media [iodinated diagnostic agents], Doxycycline, Uloric [febuxostat], Cephalexin, and Zithromax [azithromycin]  Review of Systems   Review of Systems  Respiratory:  Positive for shortness of breath.   Cardiovascular:  Positive for chest pain.  All other systems reviewed and are negative.  Physical Exam Updated Vital Signs BP (!) 161/82   Pulse 68   Temp 98.3 F (36.8 C) (Oral)   Resp 19   SpO2 97%   Physical Exam Vitals and nursing note reviewed.  Constitutional:      Appearance: She is well-developed.  HENT:     Head: Normocephalic and atraumatic.  Cardiovascular:     Rate and Rhythm: Normal rate and regular rhythm.     Heart sounds: Murmur heard.  Pulmonary:     Effort: Pulmonary effort is normal. No respiratory distress.     Comments: Occasional crackle in right lung base.  Right upper chest wall with healing surgical site with local ecchymosis, steri-strips in place.  No active bleeding or erythema.  Abdominal:     Palpations: Abdomen is soft.     Tenderness: There is no abdominal tenderness. There is no guarding or rebound.  Musculoskeletal:  General: No tenderness.     Comments: 2+DP pulses, 1+ pitting edema to BLE  Skin:    General: Skin is warm and dry.  Neurological:     Mental Status: She is alert and oriented to person, place, and time.  Psychiatric:        Behavior: Behavior normal.    ED Results / Procedures / Treatments   Labs (all labs ordered are listed, but only abnormal results are displayed) Labs  Reviewed  CBC WITH DIFFERENTIAL/PLATELET - Abnormal; Notable for the following components:      Result Value   RBC 3.67 (*)    Hemoglobin 10.8 (*)    MCHC 29.8 (*)    All other components within normal limits  COMPREHENSIVE METABOLIC PANEL - Abnormal; Notable for the following components:   Potassium 3.4 (*)    Chloride 97 (*)    CO2 35 (*)    Glucose, Bld 131 (*)    Creatinine, Ser 1.51 (*)    Calcium 8.7 (*)    Total Protein 6.2 (*)    Albumin 2.8 (*)    GFR, Estimated 36 (*)    All other components within normal limits  BRAIN NATRIURETIC PEPTIDE - Abnormal; Notable for the following components:   B Natriuretic Peptide 338.7 (*)    All other components within normal limits  TROPONIN I (HIGH SENSITIVITY) - Abnormal; Notable for the following components:   Troponin I (High Sensitivity) 20 (*)    All other components within normal limits  RESP PANEL BY RT-PCR (FLU A&B, COVID) [id number]  TROPONIN I (HIGH SENSITIVITY)    EKG None  Radiology DG Chest Portable 1 View  Result Date: 01/07/2021 CLINICAL DATA:  Chest pain EXAM: PORTABLE CHEST 1 VIEW COMPARISON:  12/09/2020 FINDINGS: 0537 hours. The cardio pericardial silhouette is enlarged. There is pulmonary vascular congestion without overt pulmonary edema. Status post CABG. Right-sided permanent pacemaker/AICD again noted. Stable scarring left base. Bones are diffusely demineralized. IMPRESSION: Cardiomegaly with pulmonary vascular congestion. Electronically Signed   By: Misty Stanley M.D.   On: 01/07/2021 05:52    Procedures Procedures   Medications Ordered in ED Medications  acetaminophen (TYLENOL) tablet 650 mg (650 mg Oral Given 01/07/21 0370)    ED Course  I have reviewed the triage vital signs and the nursing notes.  Pertinent labs & imaging results that were available during my care of the patient were reviewed by me and considered in my medical decision making (see chart for details).    MDM Rules/Calculators/A&P                            Pt with hx/o recent pacemaker exchange, CAD, oxygen dependence here for evaluation of CP/sob.  EKG similar when compared to priors.  Troponin is elevated but similar when compared to priors.  CXR with pulmonary vascular congestion, BNP stable when compared to priors.  Offered diuresis and pt declines.  Cards consulted re chest pain.  Pt care transferred pending cards evaluation.   Final Clinical Impression(s) / ED Diagnoses Final diagnoses:  None    Rx / DC Orders ED Discharge Orders     None        Quintella Reichert, MD 01/07/21 [phone]

## 2021-01-07 NOTE — ED Notes (Addendum)
Pacemaker interrogated. [employer]

## 2021-01-07 NOTE — H&P (Addendum)
Cardiology H&P:   Patient ID: [name] MRN: [id number]; DOB: 10-17-1945  Admit date: 01/07/2021 Date of Consult: 01/07/2021  PCP:  Lujean Amel, MD   [facility] Providers Cardiologist:  Sanda Klein, MD     Patient Profile:   [name] is a 75 y.o. female with a past medical history of CAD status post CABG '16 (LIMA to LAD, SVG to D1, SVG to OM1 and OM2, SVG to PDA), HFpEF, obesity, adrenal insufficiency, diabetes, hypertension, CKD stage III, OSA/obesity-hypoventilation syndrome on 3 L O2 chronically, complete heart block status post PPM/ICD who is being seen 01/07/2021 for the evaluation of chest pain at the request of Dr. Vanita Panda.  History of Present Illness:   [name] is a 75 year old female with past medical history noted above.  She is not followed by Dr. [name] as an outpatient.  She has a history of nonischemic cardiomyopathy which preceded her diagnosis of coronary disease.  Also with history of complete heart block with pacemaker implanted in 2001.  Upgraded to BiV ICD in 2008 in the setting of severe cardiomyopathy with an EF of 20%, downgraded to CRT-PE in 2014.  A [employer] device subjective recall due to potential sudden battery drain during remote transmissions.  Therefore she has been getting in office checks only.  She had a TIA August/2022 with right internal carotid stent done by Dr. Carlis Abbott.  Echo showed LVEF of 55 to 60%.  She was seen in the office on 12/11/2020 with Dr. [name] with complaints of shortness of breath.  Also complained of dizziness, weakness and blurry vision with low blood pressures of 80s/50s.  She had recently been seen at an urgent care and had a chest x-ray done that was reportedly clear and a BNP which was elevated.  It was felt her hypotension was likely related to loss of effective resynchronization pacing.  Device interrogation in the office that day showed good LV pacing and resynchronization.  It was  recommended that she recover from her heart failure exacerbation for 1 to 2 weeks prior to scheduling generator change out.  Underwent successful generator change out 11/21 with Dr. [name].  Presented to the ED on 11/28 with complaints of chest pain.  Reports feeling under the weather yesterday afternoon.  Went to bed around 10 PM and woke up around 12:30 AM with left-sided sharp chest pain.  States episode seemed to wax and wane.  She got up and went to the other into the house where her son was when symptoms did not resolve.  Ultimately called EMS.  States that her weights have been variable over the past couple weeks.  She is noted increased swelling in her lower extremities.  Reports compliance with her home medications including diuretic.  States she wears 3 L chronically at home and has not had to increase.  She is also noted abdominal fullness and back pain.  States she took 2 sublingual nitroglycerin with some improvement in her symptoms.  Was given Tylenol while in the ED and symptoms eventually abated.  Labs in the ED showed sodium 139, potassium 3.4, creatinine 1.5, BNP 338, high-sensitivity troponin 20>>17, WBC 5.5, hemoglobin 10.8.  Chest x-ray with cardiomegaly and pulmonary vascular congestion.   In talking with patient, she does report compliance with her home medications, but says she ate poorly over the thanksgiving holiday. Shortness of breath has been worse since.   Past Medical History:  Diagnosis Date   ABDOMINAL PAIN -GENERALIZED 02/21/2010   Qualifier:  Diagnosis of  By: Chester Holstein NP, Nevin Bloodgood     AICD (automatic cardioverter/defibrillator) present    downgraded to CRT-P in 2014   Anemia    Arthritis    Asthma    Atrial fibrillation ([place]) 10/24/2015   Questionable history of A Fib/A Flutter. On device interrogation on 9/7, showed 0.18min of A Fib/A Flutter with 1% burden.  Device check in Jan 2018 showed no sustained AF (runs of less than 30 seconds). No anticoagulation  indicated.   CAD (coronary artery disease)    a. 10/09/16 LHC: SVG->LAD patent, SVG->Diag patent, SVG->RCA patent, SVG->LCx occluded. EF 60%, b. 10/31/16 LHC DES to AV groove Circ, DES to intermed branch   Cellulitis and abscess of foot 12/2014   RT FOOT   CHF (congestive heart failure) (HCC)    Chronic bronchitis (HCC)    Chronic lower back pain    Complete heart block ([place]) 06/22/2015   Diverticulosis    Facial numbness 02/2016   Fatty liver disease, nonalcoholic    Gastritis    GERD (gastroesophageal reflux disease)    Gout    H/O hiatal hernia    HEMORRHOIDS-EXTERNAL 02/21/2010   Qualifier: Diagnosis of  By: Chester Holstein NP, Paula     High cholesterol    Hyperplastic colon polyp    Hypertension    IBS (irritable bowel syndrome)    Internal hemorrhoids    INTERNAL HEMORRHOIDS WITHOUT MENTION COMP 04/12/2007   Qualifier: Diagnosis of  By: Olevia Perches MD, Dora M    Nonischemic cardiomyopathy ([place]) 11/22/2011   Pt responded to BiV ICD- last EF 55-60% Sept 2017   Obesity    On home oxygen therapy    "2L at night" (10/31/2016)   OSA (obstructive sleep apnea)    "can't tolerate a mask" (10/30/2016)   PERSONAL HX COLONIC POLYPS 02/21/2010   Qualifier: Diagnosis of  By: Chester Holstein NP, Paula     Pneumonia    "couple times" (10/31/2016)   Presence of permanent cardiac pacemaker    11/02/12 [employer] V273 INTUA PPM   Right facial numbness 03/05/2016   Shortness of breath    Stroke ([place]) 09/2020   TIA (transient ischemic attack)    "recently" (10/31/2016)   Type II diabetes mellitus ([place])     Past Surgical History:  Procedure Laterality Date   ABDOMINAL ULTRASOUND  12/01/2011   Peripelvic cysts- #1- 2.4x1.9x2.3cm, #2-1.2x0.9x1.2cm   ANKLE FRACTURE SURGERY Right    "had rod put in"   ANTERIOR CERVICAL DECOMP/DISCECTOMY FUSION     APPENDECTOMY     BACK SURGERY     BIOPSY  12/29/2017   Procedure: BIOPSY;  Surgeon: [name], MD;  Location: [facility];  Service:  Endoscopy;;   BIV ICD INSERTION CRT-D  2001?   BIV PACEMAKER GENERATOR CHANGE OUT N/A 11/02/2012   Procedure: BIV PACEMAKER GENERATOR CHANGE OUT;  Surgeon: Sanda Klein, MD;  Location: [place] C[facility] LAB;  Service: Cardiovascular;  Laterality: N/A;   CARDIAC CATHETERIZATION  05/17/1999   No significant coronary obstructive disease w/ mild 20% luminal irregularity of the first diag branch of the LAD   CARDIAC CATHETERIZATION  07/08/2002   No significant CAD, moderately depressed LV systolic function   CARDIAC CATHETERIZATION Bilateral 04/26/2007   Normal findings, recommend medical therapy   CARDIAC CATHETERIZATION  02/18/2008   Moderate CAD, would benefit from having a functional study, recommend continue medical therapy   CARDIAC CATHETERIZATION  07/23/2012   Medical therapy   CARDIAC CATHETERIZATION N/A 11/24/2014   Procedure:  Left Heart Cath and Coronary Angiography;  Surgeon: Troy Sine, MD;  Location: [place] CV [facility];  Service: Cardiovascular;  Laterality: N/A;   CARDIAC CATHETERIZATION  11/27/2014   Procedure: Intravascular Pressure Wire/FFR Study;  Surgeon: Peter M Martinique, MD;  Location: [place] CV LAB;  Service: Cardiovascular;;   CARDIAC CATHETERIZATION  10/09/2016   CHOLECYSTECTOMY N/A 04/09/2018   Procedure: LAPAROSCOPIC CHOLECYSTECTOMY;  Surgeon: Greer Pickerel, MD;  [facility]: [facility];  Service: General;  Laterality: N/A;   COLONOSCOPY WITH PROPOFOL N/A 12/29/2017   Procedure: COLONOSCOPY WITH PROPOFOL;  Surgeon: [name], MD;  [facility];  Service: Endoscopy;  Laterality: N/A;   CORONARY ANGIOGRAM  2010   CORONARY ARTERY BYPASS GRAFT N/A 11/29/2014   Procedure: CORONARY ARTERY BYPASS GRAFTING x 5 (LIMA-LAD, SVG-D, SVG-OM1-OM2, SVG-PD);  Surgeon: Melrose Nakayama, MD;  Location: [facility];  Service: Open Heart Surgery;  Laterality: N/A;   CORONARY STENT INTERVENTION N/A 10/31/2016   Procedure: CORONARY STENT INTERVENTION;  Surgeon: Burnell Blanks, MD;   Location: [place] [facility];  Service: Cardiovascular;  Laterality: N/A;   ESOPHAGOGASTRODUODENOSCOPY (EGD) WITH PROPOFOL N/A 12/29/2017   Procedure: ESOPHAGOGASTRODUODENOSCOPY (EGD) WITH PROPOFOL;  Surgeon: [name], MD;  [facility];  Service: Endoscopy;  Laterality: N/A;   FRACTURE SURGERY     INSERT / REPLACE / [place] ARTHROSCOPY Bilateral    LEFT HEART CATH AND CORS/GRAFTS ANGIOGRAPHY N/A 12/09/2017   Procedure: LEFT HEART CATH AND CORS/GRAFTS ANGIOGRAPHY;  Surgeon: Troy Sine, MD;  Location: [place] [facility];  Service: Cardiovascular;  Laterality: N/A;   LEFT HEART CATHETERIZATION WITH CORONARY ANGIOGRAM N/A 07/23/2012   Procedure: LEFT HEART CATHETERIZATION WITH CORONARY ANGIOGRAM;  Surgeon: Leonie Man, MD;  Location[facility];  Service: Cardiovascular;  Laterality: N/A;   LEXISCAN MYOVIEW  11/14/2011   Mild-moderate defect seen in Mid Inferolateral and Mid Anterolateral regions-consistant w/ infarct/scar. No significant ischemia demonstrated.   POLYPECTOMY  12/29/2017   Procedure: POLYPECTOMY;  Surgeon: [name], MD;  Location: [facility];  Service: Endoscopy;;   PPM GENERATOR CHANGEOUT N/A 12/31/2020   Procedure: PPM GENERATOR CHANGEOUT;  Surgeon: Sanda Klein, MD;  Location: [place] [facility];  Service: Cardiovascular;  Laterality: N/A;   RIGHT/LEFT HEART CATH AND CORONARY ANGIOGRAPHY N/A 10/09/2016   Procedure: RIGHT/LEFT HEART CATH AND CORONARY ANGIOGRAPHY;  Surgeon: Jolaine Artist, MD;  Location: [place] [facility];  Service: Cardiovascular;  Laterality: N/A;   RIGHT/LEFT HEART CATH AND CORONARY/GRAFT ANGIOGRAPHY N/A 03/11/2019   Procedure: RIGHT/LEFT HEART CATH AND CORONARY/GRAFT ANGIOGRAPHY;  Surgeon: Jolaine Artist, MD;  Location: [place] [facility];  Service: Cardiovascular;  Laterality: N/A;   TEE WITHOUT CARDIOVERSION N/A 11/29/2014   Procedure: TRANSESOPHAGEAL ECHOCARDIOGRAM (TEE);  Surgeon:  Melrose Nakayama, MD;  Location: [place];  Service: Open Heart Surgery;  Laterality: N/A;   TRANSCAROTID ARTERY REVASCULARIZATION  Right 10/01/2020   Procedure: RIGHT TRANSCAROTID ARTERY REVASCULARIZATION;  Surgeon: [name], MD;  Location: [facility];  Service: Vascular;  Laterality: Right;   TRANSTHORACIC ECHOCARDIOGRAM  07/23/2012   EF 55-60%, normal-mild   TUBAL LIGATION     ULTRASOUND GUIDANCE FOR VASCULAR ACCESS Left 10/01/2020   Procedure: ULTRASOUND GUIDANCE FOR VASCULAR ACCESS;  Surgeon: [name], MD;  Location: [facility];  Service: Vascular;  Laterality: Left;     Home Medications:  Prior to Admission medications   Medication Sig Start Date End Date Taking? Authorizing Provider  ACCU-CHEK AVIVA PLUS test strip Check blood sugar twice daily 10/19/19  Yes [provider]  Accu-Chek Softclix Lancets lancets Check blood sugar twice daily 08/10/19  Yes [provider]  acetaminophen (TYLENOL) 500 MG tablet Take 1,000 mg by mouth 2 (two) times daily as needed (for pain or headaches).   Yes [provider]  albuterol (PROVENTIL HFA;VENTOLIN HFA) 108 (90 Base) MCG/ACT inhaler Inhale 2 puffs into the lungs every 6 (six) hours as needed for wheezing or shortness of breath. 05/02/18  Yes Fuller Plan A, MD  albuterol (PROVENTIL) (2.5 MG/3ML) 0.083% nebulizer solution Take 3 mLs (2.5 mg total) by nebulization every 6 (six) hours as needed for wheezing or shortness of breath. 05/02/18  Yes Fuller Plan A, MD  Alcohol Swabs (B-D SINGLE USE SWABS REGULAR) PADS  08/10/19  Yes [provider]  allopurinol (ZYLOPRIM) 100 MG tablet TAKE 1 TABLET BY MOUTH  DAILY Patient taking differently: Take 100 mg by mouth daily. 04/23/20  Yes Croitoru, [name], MD  aspirin EC 81 MG tablet Take 1 tablet (81 mg total) by mouth daily. 03/27/16  Yes Kilroy, Luke K, PA-C  budesonide-formoterol (SYMBICORT) 160-4.5 MCG/ACT inhaler Inhale 2 puffs into the lungs 2 (two) times  daily.   Yes [provider]  carboxymethylcellulose (REFRESH PLUS) 0.5 % SOLN Place 1 drop into both eyes every 12 (twelve) hours.   Yes [provider]  clopidogrel (PLAVIX) 75 MG tablet Take 1 tablet (75 mg total) by mouth daily. Patient taking differently: Take 75 mg by mouth at bedtime. 03/06/20  Yes Croitoru, [name], MD  cyclobenzaprine (FLEXERIL) 10 MG tablet Take 5 mg by mouth 2 (two) times daily as needed for muscle spasms. 12/26/20  Yes [provider]  Docusate Calcium (STOOL SOFTENER PO) Take 1 tablet by mouth at bedtime.   Yes [provider]  empagliflozin (JARDIANCE) 10 MG TABS tablet Take 10 mg by mouth daily before breakfast. 06/30/19  Yes Bensimhon, Shaune Pascal, MD  EPINEPHrine 0.3 mg/0.3 mL IJ SOAJ injection Inject 0.3 mg into the muscle once as needed for anaphylaxis (severe allergic reaction).   Yes [provider]  ferrous sulfate 325 (65 FE) MG tablet Take 325 mg by mouth daily with breakfast.   Yes [provider]  fluticasone (FLONASE) 50 MCG/ACT nasal spray Place 1 spray into both nostrils daily. 03/22/20  Yes [provider]  ipratropium (ATROVENT) 0.02 % nebulizer solution Take 2.5 mLs (0.5 mg total) by nebulization 4 (four) times daily. Patient taking differently: Take 0.5 mg by nebulization every 4 (four) hours as needed (Asthma). 05/02/18  Yes Fuller Plan A, MD  isosorbide mononitrate (IMDUR) 30 MG 24 hr tablet Take 90 mg by mouth daily.   Yes [provider]  loratadine (CLARITIN) 10 MG tablet Take 10 mg by mouth daily. 11/23/19  Yes [provider]  nitroGLYCERIN (NITROSTAT) 0.4 MG SL tablet Place 1 tablet (0.4 mg total) under the tongue every 5 (five) minutes as needed for chest pain. 11/13/16 01/07/21 Yes Kilroy, Luke K, PA-C  nystatin cream (MYCOSTATIN) Apply 1 application topically 2 (two) times daily as needed (rash). 06/14/20  Yes [provider]  OXYGEN Inhale 3 L/min into the  lungs continuous.    Yes [provider]  pantoprazole (PROTONIX) 40 MG tablet Take 1 tablet (40 mg total) by mouth 2 (two) times daily. 07/08/19  Yes Nandigam, Venia Minks, MD  polyethylene glycol (MIRALAX / GLYCOLAX) 17 g packet Take 17 g by mouth daily.  Yes [provider]  potassium chloride SA (KLOR-CON) 20 MEQ tablet Take 20 mEq by mouth every morning.   Yes [provider]  ranolazine (RANEXA) 1000 MG SR tablet TAKE 1 TABLET BY MOUTH  TWICE DAILY Patient taking differently: 1,000 mg 2 (two) times daily. 07/12/20  Yes Croitoru, [name], MD  rosuvastatin (CRESTOR) 40 MG tablet Take 1 tablet (40 mg total) by mouth every evening. NEED OV. 09/19/20  Yes [name], Laxman, MD  sucralfate (CARAFATE) 1 g tablet Take 1 tablet (1 g total) by mouth in the morning, at noon, and at bedtime. As needed Patient taking differently: Take 1 g by mouth at bedtime. 12/05/20  Yes Nandigam, Venia Minks, MD  torsemide (DEMADEX) 20 MG tablet Take 1 tablet (20 mg total) by mouth daily. Patient taking differently: Take 20 mg by mouth 2 (two) times daily. 04/25/20  Yes Croitoru, [name], MD    Inpatient Medications: Scheduled Meds:  albuterol  2 puff Inhalation Q6H   Continuous Infusions:  PRN Meds:   Allergies:    Allergies  Allergen Reactions   Ivp Dye [Iodinated Diagnostic Agents] Shortness Of Breath    No reaction to PO contrast with non-ionic dye.06-25-2014/rsm   Shellfish Allergy Anaphylaxis   Sulfa Antibiotics Shortness Of Breath   Iodine Hives   Atorvastatin Other (See Comments)    Pt states "causes bilateral leg pain/cramps."   Benadryl [Diphenhydramine] Other (See Comments)    "THIS DRIVES ME CRAZY AND MAKES ME FEEL LIKE I AM DYING" No problem last time they took   Colchicine Nausea And Vomiting   Contrast Media [Iodinated Diagnostic Agents] Hives   Doxycycline Other (See Comments)    Unknown reaction, per patient   Uloric [Febuxostat] Other (See Comments)    Unknown  reaction   Cephalexin Itching and Rash   Zithromax [Azithromycin] Rash    Social History:   Social History   Socioeconomic History   Marital status: Widowed    Spouse name: Not on file   Number of children: 5   Years of education: Not on file   Highest education level: Not on file  Occupational History   Occupation: STAFF/BUFFET    Employer: [employer]  Tobacco Use   Smoking status: Former    Packs/day: 0.25    Years: 3.00    Pack years: 0.75    Types: Cigarettes    Quit date: 02/11/1968    Years since quitting: 52.9   Smokeless tobacco: Never  Vaping Use   Vaping Use: Never used  Substance and Sexual Activity   Alcohol use: No   Drug use: No   Sexual activity: Never  Other Topics Concern   Not on file  Social History Narrative   Widowed last year. She lives with her two sons.   Social Determinants of Health   Financial Resource Strain: Not on file  Food Insecurity: Not on file  Transportation Needs: Not on file  Physical Activity: Not on file  Stress: Not on file  Social Connections: Not on file  Intimate Partner Violence: Not on file    Family History:    Family History  Problem Relation Age of Onset   Breast cancer Mother    Diabetes Mother    Heart disease Maternal Grandmother    Kidney disease Maternal Grandmother    Diabetes Maternal Grandmother    Glaucoma Maternal Aunt    Heart disease Maternal Aunt    Asthma Sister    Colon cancer Neg Hx  Stomach cancer Neg Hx    Pancreatic cancer Neg Hx      ROS:  Please see the history of present illness.   All other ROS reviewed and negative.     Physical Exam/Data:   Vitals:   01/07/21 1100 01/07/21 1243 01/07/21 1300 01/07/21 1330  BP: (!) 164/92 (!) 167/85 (!) 184/94 (!) 190/93  Pulse: 69 72 69 68  Resp: 20 19 19  (!) 22  Temp:      TempSrc:      SpO2: 98% 99% 100% 99%   No intake or output data in the 24 hours ending 01/07/21 1357 Last 3 Weights 12/31/2020 12/18/2020 12/11/2020   Weight (lbs) 227 lb 227 lb 227 lb  Weight (kg) 102.967 kg 102.967 kg 102.967 kg     There is no height or weight on file to calculate BMI.  General: Morbidly obese female, 3 L nasal cannula HEENT: normal Neck: Unable to assess JVD secondary to neck girth Vascular: No carotid bruits; Distal pulses 2+ bilaterally Cardiac:  normal S1, S2; RRR; no murmur  Lungs:  Diminished in bases Abd: soft, nontender, no hepatomegaly  Ext: 1+ bilateral LE edema Musculoskeletal:  No deformities, BUE and BLE strength normal and equal Skin: warm and dry  Neuro:  CNs 2-12 intact, no focal abnormalities noted Psych:  Normal affect   EKG:  The EKG was personally reviewed and demonstrates: Sinus rhythm, atrial sensed V paced Telemetry:  Telemetry was personally reviewed and demonstrates: Rhythm, intermittent V pacing  Relevant CV Studies:  Echo: 09/2020  IMPRESSIONS     1. Left ventricular ejection fraction, by estimation, is 55 to 60%. The  left ventricle has normal function. The left ventricle has no regional  wall motion abnormalities. Left ventricular diastolic parameters are  consistent with Grade I diastolic  dysfunction (impaired relaxation). Elevated left atrial pressure.   2. Right ventricular systolic function is normal. The right ventricular  size is normal.   3. Left atrial size was moderately dilated.   4. The mitral valve is normal in structure. Trivial mitral valve  regurgitation. No evidence of mitral stenosis. Moderate mitral annular  calcification.   5. The aortic valve has an indeterminant number of cusps. Aortic valve  regurgitation is trivial. Mild aortic valve stenosis.   6. The inferior vena cava is normal in size with greater than 50%  respiratory variability, suggesting right atrial pressure of 3 mmHg.   FINDINGS   Left Ventricle: Left ventricular ejection fraction, by estimation, is 55  to 60%. The left ventricle has normal function. The left ventricle has no  regional  wall motion abnormalities. The left ventricular internal cavity  size was normal in size. There is   no left ventricular hypertrophy. Left ventricular diastolic parameters  are consistent with Grade I diastolic dysfunction (impaired relaxation).  Elevated left atrial pressure.   Right Ventricle: The right ventricular size is normal. Right ventricular  systolic function is normal.   Left Atrium: Left atrial size was moderately dilated.   Right Atrium: Right atrial size was normal in size.   Pericardium: There is no evidence of pericardial effusion.   Mitral Valve: The mitral valve is normal in structure. Moderate mitral  annular calcification. Trivial mitral valve regurgitation. No evidence of  mitral valve stenosis.   Tricuspid Valve: The tricuspid valve is normal in structure. Tricuspid  valve regurgitation is trivial. No evidence of tricuspid stenosis.   Aortic Valve: The aortic valve has an indeterminant number of cusps.  Aortic  valve regurgitation is trivial. Mild aortic stenosis is present.  Aortic valve mean gradient measures 9.5 mmHg. Aortic valve peak gradient  measures 17.2 mmHg. Aortic valve area,  by VTI measures 1.43 cm.   Pulmonic Valve: The pulmonic valve was normal in structure. Pulmonic valve  regurgitation is not visualized. No evidence of pulmonic stenosis.   Aorta: The aortic root is normal in size and structure.   Venous: The inferior vena cava is normal in size with greater than 50%  respiratory variability, suggesting right atrial pressure of 3 mmHg.   IAS/Shunts: The interatrial septum was not well visualized.   Additional Comments: A device lead is visualized.   Laboratory Data:  High Sensitivity Troponin:   Recent Labs  Lab 01/07/21 0419 01/07/21 0618  TROPONINIHS 20* 17     Chemistry Recent Labs  Lab 01/07/21 0419  NA 139  K 3.4*  CL 97*  CO2 35*  GLUCOSE 131*  BUN 18  CREATININE 1.51*  CALCIUM 8.7*  GFRNONAA 36*  ANIONGAP 7     Recent Labs  Lab 01/07/21 0419  PROT 6.2*  ALBUMIN 2.8*  AST 15  ALT 14  ALKPHOS 105  BILITOT 0.5   Lipids No results for input(s): CHOL, TRIG, HDL, LABVLDL, LDLCALC, CHOLHDL in the last 168 hours.  Hematology Recent Labs  Lab 01/07/21 0419  WBC 5.5  RBC 3.67*  HGB 10.8*  HCT 36.2  MCV 98.6  MCH 29.4  MCHC 29.8*  RDW 14.6  PLT 157   Thyroid No results for input(s): TSH, FREET4 in the last 168 hours.  BNP Recent Labs  Lab 01/07/21 0419  BNP 338.7*    DDimer No results for input(s): DDIMER in the last 168 hours.   Radiology/Studies:  DG Chest Portable 1 View  Result Date: 01/07/2021 CLINICAL DATA:  Chest pain EXAM: PORTABLE CHEST 1 VIEW COMPARISON:  12/09/2020 FINDINGS: 0537 hours. The cardio pericardial silhouette is enlarged. There is pulmonary vascular congestion without overt pulmonary edema. Status post CABG. Right-sided permanent pacemaker/AICD again noted. Stable scarring left base. Bones are diffusely demineralized. IMPRESSION: Cardiomegaly with pulmonary vascular congestion. Electronically Signed   By: Misty Stanley M.D.   On: 01/07/2021 05:52     Assessment and Plan:   [name] is a 75 y.o. female with a past medical history of CAD status post CABG '16 (LIMA to LAD, SVG to D1, SVG to OM1 and OM2, SVG to PDA), HFpEF, obesity, adrenal insufficiency, diabetes, hypertension, CKD stage III, OSA/obesity-hypoventilation syndrome on 3 L O2 chronically, complete heart block status post PPM/ICD who is being seen 01/07/2021 for the evaluation of chest pain at the request of Dr. Vanita Panda.  Chest pain/history of 5v CABG'16: High-sensitivity troponin 20>> 17.  EKG shows atrial sensed, V paced sinus rhythm.  Did take 2 sublingual nitroglycerin with some improvement in symptoms.  Received Tylenol in the ED with overall resolution of pain. Suspect symptoms related to acute CHF. -- on ASA, plavix, Imdur, ranexa. Resume BB therapy as noted below.   HFpEF: Reports  increased lower extremity edema at home.  BNP 338.  Chest x-ray with pulmonary vascular congestion.  Reports she has been compliant with her home diuretic.  Suspect chest pain likely related to volume overload. On torsemide 20mg  BID PTA -- start IV lasix 80mg  BID  -- resume cardio-selective BB with bisoprolol 5mg  daily  -- on SLGT2 PTA  Hx of CHB s/p PPM/ICD: recent gen change 11/21. Will interrogate device   HTN: blood pressures are  elevated while in the ED. Says she was instructed to resume on bisoprolol 5mg  daily after her recent gen change but does not appear on home med list -- will resume bisoprolol 5mg  daily, plan to add hydralazine if further BP control needed -- avoid ACE/ARB with renal disease   HLD: on Crestor 40mg  daily   CKD stage III: baseline Cr 1.2-1.5 -- BMET daily with diuresis   Hypokalemia: K + 3.4 -- supplement 76meq x1 now -- BMET daily  OSA/obesity-hypoventilation syndrome: wears 3L chronically  DM: on Jardiance  -- add SSI -- check Hgb A1c   Risk Assessment/Risk Scores:   HEAR Score (for undifferentiated chest pain):  HEAR Score: 5{   New York Heart Association (NYHA) Functional Class NYHA Class III   For questions or updates, please contact [facility] Please consult [email or web address] for contact info under    Signed, Reino Bellis, NP  01/07/2021 1:57 PM

## 2021-01-08 ENCOUNTER — Inpatient Hospital Stay: Payer: Medicare Other | Admitting: Adult Health

## 2021-01-08 LAB — BASIC METABOLIC PANEL
Anion gap: 6 (ref 5–15)
BUN: 16 mg/dL (ref 8–23)
CO2: 36 mmol/L — ABNORMAL HIGH (ref 22–32)
Calcium: 9.2 mg/dL (ref 8.9–10.3)
Chloride: 97 mmol/L — ABNORMAL LOW (ref 98–111)
Creatinine, Ser: 1.36 mg/dL — ABNORMAL HIGH (ref 0.44–1.00)
GFR, Estimated: 40 mL/min — ABNORMAL LOW (ref >60)
Glucose, Bld: 128 mg/dL — ABNORMAL HIGH (ref 70–99)
Potassium: 4.2 mmol/L (ref 3.5–5.1)
Sodium: 139 mmol/L (ref 135–145)

## 2021-01-08 LAB — HEMOGLOBIN A1C
Hgb A1c MFr Bld: 6.3 % — ABNORMAL HIGH (ref 4.8–5.6)
Hgb A1c MFr Bld: 6.5 % — ABNORMAL HIGH (ref 4.8–5.6)
Mean Plasma Glucose: 134 mg/dL
Mean Plasma Glucose: 140 mg/dL

## 2021-01-08 LAB — GLUCOSE, CAPILLARY
Glucose-Capillary: 125 mg/dL — ABNORMAL HIGH (ref 70–99)
Glucose-Capillary: 128 mg/dL — ABNORMAL HIGH (ref 70–99)

## 2021-01-08 LAB — CBG MONITORING, ED
Glucose-Capillary: 121 mg/dL — ABNORMAL HIGH (ref 70–99)
Glucose-Capillary: 123 mg/dL — ABNORMAL HIGH (ref 70–99)

## 2021-01-08 MED ORDER — IPRATROPIUM-ALBUTEROL 0.5-2.5 (3) MG/3ML IN SOLN
3.0000 mL | RESPIRATORY_TRACT | Status: DC | PRN
  Administered 2021-01-09: 3 mL via RESPIRATORY_TRACT
  Filled 2021-01-08: qty 3

## 2021-01-08 MED ORDER — CYCLOBENZAPRINE HCL 5 MG PO TABS
ORAL_TABLET | ORAL | Status: AC
  Administered 2021-01-08: 5 mg via ORAL
  Filled 2021-01-08: qty 1

## 2021-01-08 MED ORDER — POLYVINYL ALCOHOL 1.4 % OP SOLN
2.0000 [drp] | Freq: Two times a day (BID) | OPHTHALMIC | Status: DC
  Administered 2021-01-08 – 2021-01-10 (×4): 2 [drp] via OPHTHALMIC
  Filled 2021-01-08: qty 15

## 2021-01-08 MED ORDER — CYCLOBENZAPRINE HCL 5 MG PO TABS
5.0000 mg | ORAL_TABLET | Freq: Two times a day (BID) | ORAL | Status: DC | PRN
  Administered 2021-01-10: 5 mg via ORAL
  Filled 2021-01-08 (×2): qty 1

## 2021-01-08 MED ORDER — INSULIN ASPART 100 UNIT/ML IJ SOLN
0.0000 [IU] | Freq: Three times a day (TID) | INTRAMUSCULAR | Status: DC
  Administered 2021-01-09 – 2021-01-10 (×4): 2 [IU] via SUBCUTANEOUS

## 2021-01-08 NOTE — Progress Notes (Signed)
Pt c/o 8/10 chronic back pain and gout pain. Pt also c/o throat spasms. Pt requests Flexeril 5mg  PO BID PRN. This med appears on her PTA med list. Pt also requests Prednisone for gout pain. Pt states she usually gets a prescription for tapered Prednisone starting at 50 mg PO from Dr. Titus Mould. Pt's home dose of Tylenol is 1000 mg PO BID PRN for pain also. MD paged.

## 2021-01-08 NOTE — Progress Notes (Deleted)
[facility] [address] [place]. [place] [address] [phone]       HOSPITAL FOLLOW UP NOTE  [name] Date of Birth:  Mar 17, 1945 Medical Record Number:  [id number]   Reason for Referral:  hospital stroke follow up    SUBJECTIVE:   CHIEF COMPLAINT:  No chief complaint on file.   HPI:           PERTINENT IMAGING      ROS:   14 system review of systems performed and negative with exception of ***  PMH:  Past Medical History:  Diagnosis Date   ABDOMINAL PAIN -GENERALIZED 02/21/2010   Qualifier: Diagnosis of  By: Chester Holstein NP, Nevin Bloodgood     AICD (automatic cardioverter/defibrillator) present    downgraded to CRT-P in 2014   Anemia    Arthritis    Asthma    Atrial fibrillation ([place]) 10/24/2015   Questionable history of A Fib/A Flutter. On device interrogation on 9/7, showed 0.72min of A Fib/A Flutter with 1% burden.  Device check in Jan 2018 showed no sustained AF (runs of less than 30 seconds). No anticoagulation indicated.   CAD (coronary artery disease)    a. 10/09/16 LHC: SVG->LAD patent, SVG->Diag patent, SVG->RCA patent, SVG->LCx occluded. EF 60%, b. 10/31/16 LHC DES to AV groove Circ, DES to intermed branch   Cellulitis and abscess of foot 12/2014   RT FOOT   CHF (congestive heart failure) (HCC)    Chronic bronchitis (HCC)    Chronic lower back pain    Complete heart block (HCC) 06/22/2015   Diverticulosis    Facial numbness 02/2016   Fatty liver disease, nonalcoholic    Gastritis    GERD (gastroesophageal reflux disease)    Gout    H/O hiatal hernia    HEMORRHOIDS-EXTERNAL 02/21/2010   Qualifier: Diagnosis of  By: Chester Holstein NP, Paula     High cholesterol    Hyperplastic colon polyp    Hypertension    IBS (irritable bowel syndrome)    Internal hemorrhoids    INTERNAL HEMORRHOIDS WITHOUT MENTION COMP 04/12/2007   Qualifier: Diagnosis of  By: Olevia Perches MD, Dora M    Nonischemic cardiomyopathy ([place]) 11/22/2011   Pt  responded to BiV ICD- last EF 55-60% Sept 2017   Obesity    On home oxygen therapy    "2L at night" (10/31/2016)   OSA (obstructive sleep apnea)    "can't tolerate a mask" (10/30/2016)   PERSONAL HX COLONIC POLYPS 02/21/2010   Qualifier: Diagnosis of  By: Chester Holstein NP, Paula     Pneumonia    "couple times" (10/31/2016)   Presence of permanent cardiac pacemaker    11/02/12 [employer] V273 INTUA PPM   Right facial numbness 03/05/2016   Shortness of breath    Stroke ([place]) 09/2020   TIA (transient ischemic attack)    "recently" (10/31/2016)   Type II diabetes mellitus ([place])     PSH:  Past Surgical History:  Procedure Laterality Date   ABDOMINAL ULTRASOUND  12/01/2011   Peripelvic cysts- #1- 2.4x1.9x2.3cm, #2-1.2x0.9x1.2cm   ANKLE FRACTURE SURGERY Right    "had rod put in"   ANTERIOR CERVICAL DECOMP/DISCECTOMY FUSION     APPENDECTOMY     BACK SURGERY     BIOPSY  12/29/2017   Procedure: BIOPSY;  Surgeon: [name], MD;  Location: [facility];  Service: Endoscopy;;   BIV ICD INSERTION CRT-D  2001?   BIV PACEMAKER GENERATOR CHANGE OUT N/A 11/02/2012   Procedure: BIV PACEMAKER  GENERATOR CHANGE OUT;  Surgeon: Sanda Klein, MD;  Location: [facility];  Service: Cardiovascular;  Laterality: N/A;   CARDIAC CATHETERIZATION  05/17/1999   No significant coronary obstructive disease w/ mild 20% luminal irregularity of the first diag branch of the LAD   CARDIAC CATHETERIZATION  07/08/2002   No significant CAD, moderately depressed LV systolic function   CARDIAC CATHETERIZATION Bilateral 04/26/2007   Normal findings, recommend medical therapy   CARDIAC CATHETERIZATION  02/18/2008   Moderate CAD, would benefit from having a functional study, recommend continue medical therapy   CARDIAC CATHETERIZATION  07/23/2012   Medical therapy   CARDIAC CATHETERIZATION N/A 11/24/2014   Procedure: Left Heart Cath and Coronary Angiography;  Surgeon: Troy Sine, MD;  Location: [facility];   Service: Cardiovascular;  Laterality: N/A;   CARDIAC CATHETERIZATION  11/27/2014   Procedure: Intravascular Pressure Wire/FFR Study;  Surgeon: Peter M Martinique, MD;  Location: [facility];  Service: Cardiovascular;;   CARDIAC CATHETERIZATION  10/09/2016   CHOLECYSTECTOMY N/A 04/09/2018   Procedure: LAPAROSCOPIC CHOLECYSTECTOMY;  Surgeon: Greer Pickerel, MD;  [facility]: [facility];  Service: General;  Laterality: N/A;   COLONOSCOPY WITH PROPOFOL N/A 12/29/2017   Procedure: COLONOSCOPY WITH PROPOFOL;  Surgeon: [name], MD;  [facility]: [facility];  Service: Endoscopy;  Laterality: N/A;   CORONARY ANGIOGRAM  2010   CORONARY ARTERY BYPASS GRAFT N/A 11/29/2014   Procedure: CORONARY ARTERY BYPASS GRAFTING x 5 (LIMA-LAD, SVG-D, SVG-OM1-OM2, SVG-PD);  Surgeon: Melrose Nakayama, MD;  Location: [facility];  Service: Open Heart Surgery;  Laterality: N/A;   CORONARY STENT INTERVENTION N/A 10/31/2016   Procedure: CORONARY STENT INTERVENTION;  Surgeon: Burnell Blanks, MD;  Location: [facility];  Service: Cardiovascular;  Laterality: N/A;   ESOPHAGOGASTRODUODENOSCOPY (EGD) WITH PROPOFOL N/A 12/29/2017   Procedure: ESOPHAGOGASTRODUODENOSCOPY (EGD) WITH PROPOFOL;  Surgeon: [name], MD;  [facility];  Service: Endoscopy;  Laterality: N/A;   FRACTURE SURGERY     INSERT / REPLACE / [place] ARTHROSCOPY Bilateral    LEFT HEART CATH AND CORS/GRAFTS ANGIOGRAPHY N/A 12/09/2017   Procedure: LEFT HEART CATH AND CORS/GRAFTS ANGIOGRAPHY;  Surgeon: Troy Sine, MD;  Location: [facility];  Service: Cardiovascular;  Laterality: N/A;   LEFT HEART CATHETERIZATION WITH CORONARY ANGIOGRAM N/A 07/23/2012   Procedure: LEFT HEART CATHETERIZATION WITH CORONARY ANGIOGRAM;  Surgeon: Leonie Man, MD;  [facility];  Service: Cardiovascular;  Laterality: N/A;   LEXISCAN MYOVIEW  11/14/2011   Mild-moderate defect seen in Mid Inferolateral and Mid  Anterolateral regions-consistant w/ infarct/scar. No significant ischemia demonstrated.   POLYPECTOMY  12/29/2017   Procedure: POLYPECTOMY;  Surgeon: [name], MD;  Location: [facility];  Service: Endoscopy;;   PPM GENERATOR CHANGEOUT N/A 12/31/2020   Procedure: PPM GENERATOR CHANGEOUT;  Surgeon: Sanda Klein, MD;  Location: [facility];  Service: Cardiovascular;  Laterality: N/A;   RIGHT/LEFT HEART CATH AND CORONARY ANGIOGRAPHY N/A 10/09/2016   Procedure: RIGHT/LEFT HEART CATH AND CORONARY ANGIOGRAPHY;  Surgeon: Jolaine Artist, MD;  Location: [facility];  Service: Cardiovascular;  Laterality: N/A;   RIGHT/LEFT HEART CATH AND CORONARY/GRAFT ANGIOGRAPHY N/A 03/11/2019   Procedure: RIGHT/LEFT HEART CATH AND CORONARY/GRAFT ANGIOGRAPHY;  Surgeon: Jolaine Artist, MD;  Location: [facility];  Service: Cardiovascular;  Laterality: N/A;   TEE WITHOUT CARDIOVERSION N/A 11/29/2014   Procedure: TRANSESOPHAGEAL ECHOCARDIOGRAM (TEE);  Surgeon: Melrose Nakayama, MD;  Location: [facility];  Service: Open Heart Surgery;  Laterality: N/A;   TRANSCAROTID ARTERY REVASCULARIZATION  Right 10/01/2020   Procedure: RIGHT TRANSCAROTID ARTERY REVASCULARIZATION;  Surgeon: [name], MD;  Location: [facility];  Service: Vascular;  Laterality: Right;   TRANSTHORACIC ECHOCARDIOGRAM  07/23/2012   EF 55-60%, normal-mild   TUBAL LIGATION     ULTRASOUND GUIDANCE FOR VASCULAR ACCESS Left 10/01/2020   Procedure: ULTRASOUND GUIDANCE FOR VASCULAR ACCESS;  Surgeon: [name], MD;  Location: [facility];  Service: Vascular;  Laterality: Left;    Social History:  Social History   Socioeconomic History   Marital status: Widowed    Spouse name: Not on file   Number of children: 5   Years of education: Not on file   Highest education level: Not on file  Occupational History   Occupation: STAFF/BUFFET    Employer: [employer]  Tobacco Use   Smoking status: Former     Packs/day: 0.25    Years: 3.00    Pack years: 0.75    Types: Cigarettes    Quit date: 02/11/1968    Years since quitting: 52.9   Smokeless tobacco: Never  Vaping Use   Vaping Use: Never used  Substance and Sexual Activity   Alcohol use: No   Drug use: No   Sexual activity: Never  Other Topics Concern   Not on file  Social History Narrative   Widowed last year. She lives with her two sons.   Social Determinants of Health   Financial Resource Strain: Not on file  Food Insecurity: Not on file  Transportation Needs: Not on file  Physical Activity: Not on file  Stress: Not on file  Social Connections: Not on file  Intimate Partner Violence: Not on file    Family History:  Family History  Problem Relation Age of Onset   Breast cancer Mother    Diabetes Mother    Heart disease Maternal Grandmother    Kidney disease Maternal Grandmother    Diabetes Maternal Grandmother    Glaucoma Maternal Aunt    Heart disease Maternal Aunt    Asthma Sister    Colon cancer Neg Hx    Stomach cancer Neg Hx    Pancreatic cancer Neg Hx     Medications:   Current Facility-Administered Medications on File Prior to Visit  Medication Dose Route Frequency Provider Last Rate Last Admin   0.9 %  sodium chloride infusion  250 mL Intravenous PRN Reino Bellis B, NP       acetaminophen (TYLENOL) tablet 650 mg  650 mg Oral Q4H PRN Reino Bellis B, NP   650 mg at 01/08/21 0256   albuterol (PROVENTIL) (2.5 MG/3ML) 0.083% nebulizer solution 2.5 mg  2.5 mg Inhalation Q6H PRN Reino Bellis B, NP       albuterol (PROVENTIL) (2.5 MG/3ML) 0.083% nebulizer solution 2.5 mg  2.5 mg Nebulization Q6H Janina Mayo, MD   2.5 mg at 01/08/21 0250   aspirin EC tablet 81 mg  81 mg Oral Daily Reino Bellis B, NP   81 mg at 01/07/21 1553   bisoprolol (ZEBETA) tablet 10 mg  10 mg Oral Daily Janina Mayo, MD       clopidogrel (PLAVIX) tablet 75 mg  75 mg Oral Daily Reino Bellis B, NP   75 mg at  01/07/21 1554   empagliflozin (JARDIANCE) tablet 10 mg  10 mg Oral QAC breakfast Cheryln Manly, NP       ferrous  sulfate tablet 325 mg  325 mg Oral Q breakfast Reino Bellis B, NP   325 mg at 01/07/21 1553   furosemide (LASIX) injection 80 mg  80 mg Intravenous BID Reino Bellis B, NP   80 mg at 01/07/21 2011   heparin injection 5,000 Units  5,000 Units Subcutaneous Q8H Reino Bellis B, NP   5,000 Units at 01/08/21 8295   hydrALAZINE (APRESOLINE) injection 10 mg  10 mg Intravenous Q8H PRN Janina Mayo, MD       insulin aspart (novoLOG) injection 0-15 Units  0-15 Units Subcutaneous TID WC Reino Bellis B, NP   3 Units at 01/07/21 1643   ipratropium (ATROVENT) nebulizer solution 0.5 mg  0.5 mg Nebulization QID Reino Bellis B, NP   0.5 mg at 01/07/21 1557   isosorbide mononitrate (IMDUR) 24 hr tablet 90 mg  90 mg Oral Daily Reino Bellis B, NP   90 mg at 01/07/21 1553   mometasone-formoterol (DULERA) 200-5 MCG/ACT inhaler 2 puff  2 puff Inhalation BID Reino Bellis B, NP       ondansetron [facility]) injection 4 mg  4 mg Intravenous Q6H PRN Cheryln Manly, NP       pantoprazole (PROTONIX) EC tablet 40 mg  40 mg Oral BID Reino Bellis B, NP   40 mg at 01/07/21 2153   ranolazine (RANEXA) 12 hr tablet 1,000 mg  1,000 mg Oral BID Reino Bellis B, NP   1,000 mg at 01/07/21 1553   rosuvastatin (CRESTOR) tablet 40 mg  40 mg Oral QPM Reino Bellis B, NP   40 mg at 01/07/21 1757   sodium chloride flush (NS) 0.9 % injection 3 mL  3 mL Intravenous Q12H Reino Bellis B, NP   3 mL at 01/07/21 2122   sodium chloride flush (NS) 0.9 % injection 3 mL  3 mL Intravenous PRN Cheryln Manly, NP       Current Outpatient Medications on File Prior to Visit  Medication Sig Dispense Refill   ACCU-CHEK AVIVA PLUS test strip Check blood sugar twice daily     Accu-Chek Softclix Lancets lancets Check blood sugar twice daily     acetaminophen (TYLENOL) 500 MG tablet Take 1,000 mg by  mouth 2 (two) times daily as needed (for pain or headaches).     albuterol (PROVENTIL HFA;VENTOLIN HFA) 108 (90 Base) MCG/ACT inhaler Inhale 2 puffs into the lungs every 6 (six) hours as needed for wheezing or shortness of breath. 1 Inhaler 0   albuterol (PROVENTIL) (2.5 MG/3ML) 0.083% nebulizer solution Take 3 mLs (2.5 mg total) by nebulization every 6 (six) hours as needed for wheezing or shortness of breath. 75 mL 0   Alcohol Swabs (B-D SINGLE USE SWABS REGULAR) PADS      allopurinol (ZYLOPRIM) 100 MG tablet TAKE 1 TABLET BY MOUTH  DAILY (Patient taking differently: Take 100 mg by mouth daily.) 90 tablet 3   aspirin EC 81 MG tablet Take 1 tablet (81 mg total) by mouth daily. 90 tablet 3   budesonide-formoterol (SYMBICORT) 160-4.5 MCG/ACT inhaler Inhale 2 puffs into the lungs 2 (two) times daily.     carboxymethylcellulose (REFRESH PLUS) 0.5 % SOLN Place 1 drop into both eyes every 12 (twelve) hours.     clopidogrel (PLAVIX) 75 MG tablet Take 1 tablet (75 mg total) by mouth daily. (Patient taking differently: Take 75 mg by mouth at bedtime.) 90 tablet 1   cyclobenzaprine (FLEXERIL) 10 MG tablet Take 5 mg by mouth 2 (two) times  daily as needed for muscle spasms.     Docusate Calcium (STOOL SOFTENER PO) Take 1 tablet by mouth at bedtime.     empagliflozin (JARDIANCE) 10 MG TABS tablet Take 10 mg by mouth daily before breakfast. 90 tablet 3   EPINEPHrine 0.3 mg/0.3 mL IJ SOAJ injection Inject 0.3 mg into the muscle once as needed for anaphylaxis (severe allergic reaction).     ferrous sulfate 325 (65 FE) MG tablet Take 325 mg by mouth daily with breakfast.     fluticasone (FLONASE) 50 MCG/ACT nasal spray Place 1 spray into both nostrils daily.     ipratropium (ATROVENT) 0.02 % nebulizer solution Take 2.5 mLs (0.5 mg total) by nebulization 4 (four) times daily. (Patient taking differently: Take 0.5 mg by nebulization every 4 (four) hours as needed (Asthma).) 75 mL 0   isosorbide mononitrate (IMDUR) 30  MG 24 hr tablet Take 90 mg by mouth daily.     loratadine (CLARITIN) 10 MG tablet Take 10 mg by mouth daily.     nitroGLYCERIN (NITROSTAT) 0.4 MG SL tablet Place 1 tablet (0.4 mg total) under the tongue every 5 (five) minutes as needed for chest pain. 25 tablet 3   nystatin cream (MYCOSTATIN) Apply 1 application topically 2 (two) times daily as needed (rash).     OXYGEN Inhale 3 L/min into the lungs continuous.      pantoprazole (PROTONIX) 40 MG tablet Take 1 tablet (40 mg total) by mouth 2 (two) times daily. 180 tablet 3   polyethylene glycol (MIRALAX / GLYCOLAX) 17 g packet Take 17 g by mouth daily.     potassium chloride SA (KLOR-CON) 20 MEQ tablet Take 20 mEq by mouth every morning.     ranolazine (RANEXA) 1000 MG SR tablet TAKE 1 TABLET BY MOUTH  TWICE DAILY (Patient taking differently: 1,000 mg 2 (two) times daily.) 180 tablet 1   rosuvastatin (CRESTOR) 40 MG tablet Take 1 tablet (40 mg total) by mouth every evening. NEED OV. 90 tablet 2   sucralfate (CARAFATE) 1 g tablet Take 1 tablet (1 g total) by mouth in the morning, at noon, and at bedtime. As needed (Patient taking differently: Take 1 g by mouth at bedtime.) 90 tablet 3   torsemide (DEMADEX) 20 MG tablet Take 1 tablet (20 mg total) by mouth daily. (Patient taking differently: Take 20 mg by mouth 2 (two) times daily.) 90 tablet 3    Allergies:   Allergies  Allergen Reactions   Ivp Dye [Iodinated Diagnostic Agents] Shortness Of Breath    No reaction to PO contrast with non-ionic dye.06-25-2014/rsm   Shellfish Allergy Anaphylaxis   Sulfa Antibiotics Shortness Of Breath   Iodine Hives   Atorvastatin Other (See Comments)    Pt states "causes bilateral leg pain/cramps."   Benadryl [Diphenhydramine] Other (See Comments)    "THIS DRIVES ME CRAZY AND MAKES ME FEEL LIKE I AM DYING" No problem last time they took   Colchicine Nausea And Vomiting   Contrast Media [Iodinated Diagnostic Agents] Hives   Doxycycline Other (See Comments)     Unknown reaction, per patient   Uloric [Febuxostat] Other (See Comments)    Unknown reaction   Cephalexin Itching and Rash   Zithromax [Azithromycin] Rash      OBJECTIVE:  Physical Exam  There were no vitals filed for this visit. There is no height or weight on file to calculate BMI. No results found.  Depression screen PHQ 2/9 03/11/2017  Decreased Interest 1  Down, Depressed, Hopeless  0  PHQ - 2 Score 1  Some recent data might be hidden     General: well developed, well nourished, seated, in no evident distress Head: head normocephalic and atraumatic.   Neck: supple with no carotid or supraclavicular bruits Cardiovascular: regular rate and rhythm, no murmurs Musculoskeletal: no deformity Skin:  no rash/petichiae Vascular:  Normal pulses all extremities   Neurologic Exam Mental Status: Awake and fully alert. Oriented to place and time. Recent and remote memory intact. Attention span, concentration and fund of knowledge appropriate. Mood and affect appropriate.  Cranial Nerves: Fundoscopic exam reveals sharp disc margins. Pupils equal, briskly reactive to light. Extraocular movements full without nystagmus. Visual fields full to confrontation. Hearing intact. Facial sensation intact. Face, tongue, palate moves normally and symmetrically.  Motor: Normal bulk and tone. Normal strength in all tested extremity muscles Sensory.: intact to touch , pinprick , position and vibratory sensation.  Coordination: Rapid alternating movements normal in all extremities. Finger-to-nose and heel-to-shin performed accurately bilaterally. Gait and Station: Arises from chair without difficulty. Stance is normal. Gait demonstrates normal stride length and balance with ***. Tandem walk and heel toe ***.  Reflexes: 1+ and symmetric. Toes downgoing.     NIHSS  *** Modified [place]in  ***      ASSESSMENT: [name] is a 75 y.o. year old female ***. Vascular risk factors include ***.       PLAN:  *** : Residual deficit: ***. Continue {anticoagulants:31417}  and ***  for secondary stroke prevention.  Discussed secondary stroke prevention measures and importance of close PCP follow up for aggressive stroke risk factor management. I have gone over the pathophysiology of stroke, warning signs and symptoms, risk factors and their management in some detail with instructions to go to the closest emergency room for symptoms of concern. HTN: BP goal <130/90.  Stable on *** per PCP HLD: LDL goal <70. Recent LDL ***.  DMII: A1c goal<7.0. Recent A1c ***.     Follow up in *** or call earlier if needed   CC:  [facility] provider: Dr. Leonie Man PCP: Lujean Amel, MD    I spent *** minutes of face-to-face and non-face-to-face time with patient.  This included previsit chart review including review of recent hospitalization, lab review, study review, order entry, electronic health record documentation, patient education regarding recent stroke including etiology, secondary stroke prevention measures and importance of managing stroke risk factors, residual deficits and typical recovery time and answered all other questions to patient satisfaction   Frann Rider, AGNP-BC  [facility] [address] [place], [place] [address]  Phone [phone] Fax [phone] Note: This document was prepared with digital dictation and possible smart phrase technology. Any transcriptional errors that result from this process are unintentional.

## 2021-01-08 NOTE — Progress Notes (Signed)
Progress Note  Patient Name: [name] Date of Encounter: 01/08/2021  [facility] Cardiologist: Sanda Klein, MD   Subjective   Mr Iglesia notes some back pain. She had minimal CP but not concerning.   Vitals On home 3 L O2 Hypertensive A sensed , V paced  Crt 1.51->1.35->1.36 (bsl 1.1-1.6) Hgb stable A1c 6.3  Net negative 1.7 L  Wt Readings from Last 3 Encounters:  01/07/21 61.2 kg  12/31/20 103 kg  12/18/20 103 kg     Inpatient Medications    Scheduled Meds:  albuterol  2.5 mg Nebulization Q6H   aspirin EC  81 mg Oral Daily   bisoprolol  10 mg Oral Daily   clopidogrel  75 mg Oral Daily   empagliflozin  10 mg Oral QAC breakfast   ferrous sulfate  325 mg Oral Q breakfast   furosemide  80 mg Intravenous BID   heparin  5,000 Units Subcutaneous Q8H   insulin aspart  0-15 Units Subcutaneous TID WC   ipratropium  0.5 mg Nebulization QID   isosorbide mononitrate  90 mg Oral Daily   mometasone-formoterol  2 puff Inhalation BID   pantoprazole  40 mg Oral BID   ranolazine  1,000 mg Oral BID   rosuvastatin  40 mg Oral QPM   sodium chloride flush  3 mL Intravenous Q12H   Continuous Infusions:  sodium chloride     PRN Meds: sodium chloride, acetaminophen, albuterol, hydrALAZINE, ondansetron (ZOFRAN) IV, sodium chloride flush   Vital Signs    Vitals:   01/08/21 0615 01/08/21 0630 01/08/21 0800 01/08/21 1218  BP:  (!) 167/87 (!) 157/134 133/89  Pulse: 66 69 (!) 121 74  Resp: 20 16 17 18   Temp:    98.9 F (37.2 C)  TempSrc:    Oral  SpO2: 98% 98% 100% 97%  Weight:      Height:        Intake/Output Summary (Last 24 hours) at 01/08/2021 1225 Last data filed at 01/07/2021 2317 Gross per 24 hour  Intake 3 ml  Output 1800 ml  Net -1797 ml   Last 3 Weights 01/07/2021 12/31/2020 12/18/2020  Weight (lbs) 135 lb 227 lb 227 lb  Weight (kg) 61.236 kg 102.967 kg 102.967 kg      Telemetry    A sensed , V paced - Personally Reviewed  ECG    A  sensed, V paced - Personally Reviewed  Physical Exam   GEN: No acute distress. Morbidly obese Neck: + JVD Cardiac: RRR, no murmurs, rubs, or gallops.  Respiratory: normal wob, no rales or rhonchi GI: Soft, nontender, non-distended  MS: No edema; No deformity. Neuro:  Nonfocal  Ext: 1+ BL leg edema Psych: Normal affect   Labs    High Sensitivity Troponin:   Recent Labs  Lab 01/07/21 0419 01/07/21 0618  TROPONINIHS 20* 17     Chemistry Recent Labs  Lab 01/07/21 0419 01/07/21 1636 01/08/21 0647  NA 139  --  139  K 3.4*  --  4.2  CL 97*  --  97*  CO2 35*  --  36*  GLUCOSE 131*  --  128*  BUN 18  --  16  CREATININE 1.51* 1.35* 1.36*  CALCIUM 8.7*  --  9.2  PROT 6.2*  --   --   ALBUMIN 2.8*  --   --   AST 15  --   --   ALT 14  --   --   ALKPHOS 105  --   --  BILITOT 0.5  --   --   GFRNONAA 36* 41* 40*  ANIONGAP 7  --  6    Lipids No results for input(s): CHOL, TRIG, HDL, LABVLDL, LDLCALC, CHOLHDL in the last 168 hours.  Hematology Recent Labs  Lab 01/07/21 0419 01/07/21 1636  WBC 5.5 5.7  RBC 3.67* 3.67*  HGB 10.8* 10.8*  HCT 36.2 36.5  MCV 98.6 99.5  MCH 29.4 29.4  MCHC 29.8* 29.6*  RDW 14.6 14.6  PLT 157 164   Thyroid No results for input(s): TSH, FREET4 in the last 168 hours.  BNP Recent Labs  Lab 01/07/21 0419  BNP 338.7*    DDimer No results for input(s): DDIMER in the last 168 hours.   Radiology    DG Chest Portable 1 View  Result Date: 01/07/2021 CLINICAL DATA:  Chest pain EXAM: PORTABLE CHEST 1 VIEW COMPARISON:  12/09/2020 FINDINGS: 0537 hours. The cardio pericardial silhouette is enlarged. There is pulmonary vascular congestion without overt pulmonary edema. Status post CABG. Right-sided permanent pacemaker/AICD again noted. Stable scarring left base. Bones are diffusely demineralized. IMPRESSION: Cardiomegaly with pulmonary vascular congestion. Electronically Signed   By: Misty Stanley M.D.   On: 01/07/2021 05:52    Cardiac Studies      Patient Profile     [name] is a 75 y.o. female with a past medical history of CAD status post CABG '16 (LIMA to LAD, SVG to D1, SVG to OM1 and OM2, SVG to PDA), HFpEF, obesity, adrenal insufficiency, diabetes, hypertension, CKD stage III, OSA/obesity-hypoventilation syndrome on 3 L O2 chronically, complete heart block status post PPM/ICD who is being seen 01/07/2021 for the evaluation of chest pain at the request of Dr. Vanita Panda.   Assessment & Plan    #Chest Pain: hx of CABG '16 (LIMA to LAD, SVG to D1, SVG to OM1 and OM2, SVG to PDA),  Cath 03/11/2019 showed stable known native dx. Chronic occluded SVG to OM. All other grafts patent. Wedge 14, no sig pulmonary htn. Her chest pain is very mild and improving.  Her tropoinn was 20->17 with low concern for ACS.   She had a recent gen change. The site looks clean and dry. No signs of erythema or hematoma - no plan for ischemic eval - continue plavix /asa, crestor - continue ranexa - increased bisprolol to 10 mg daily   #Decompensated Hfpef: EF 55-60. She had some VO prior to PPM. She denies missing doses of medications. She appear mildly volume up.  BNP mildly elevated 300s, underestimated with body habitus. Cxray shows pulmonary vascular congestion. Will plan for diuresis -continue lasix 80 mg IV BID, diuresing well. Exam challenging, will diurese as renal fxn allows -on SGLT2   #OSA/Obesity Hyperventilation: chronic 3L   #Hypertension:  not well controlled - continue imdur 90 mg daily - BB per above - hydralazine 10mg  IV Prn for now, can trainsition to losartan as renal fxn gets closer to baseline   #CHB: S/p PPM CRT-P. [place] Her EKG is a sensed, V-paced. Atrial rhythm is sinus.Has good capture.Gen change per above.    #DM2 -continue jardiance -insulin sliding scale  FENGI: HH diet Access: PIV Prophylaxis:heparin sub Q Code: Full Dispo: admission to cardiology     For questions or updates, please  contact House Please consult [email or web address] for contact info under        Signed, Janina Mayo, MD  01/08/2021, 12:25 PM

## 2021-01-08 NOTE — ED Notes (Signed)
Breakfast order placed ?

## 2021-01-08 NOTE — ED Notes (Addendum)
Report Called to [place]

## 2021-01-09 ENCOUNTER — Ambulatory Visit: Payer: Medicare Other

## 2021-01-09 LAB — BASIC METABOLIC PANEL
Anion gap: 9 (ref 5–15)
BUN: 20 mg/dL (ref 8–23)
CO2: 34 mmol/L — ABNORMAL HIGH (ref 22–32)
Calcium: 9.3 mg/dL (ref 8.9–10.3)
Chloride: 92 mmol/L — ABNORMAL LOW (ref 98–111)
Creatinine, Ser: 1.52 mg/dL — ABNORMAL HIGH (ref 0.44–1.00)
GFR, Estimated: 35 mL/min — ABNORMAL LOW (ref >60)
Glucose, Bld: 125 mg/dL — ABNORMAL HIGH (ref 70–99)
Potassium: 3.4 mmol/L — ABNORMAL LOW (ref 3.5–5.1)
Sodium: 135 mmol/L (ref 135–145)

## 2021-01-09 LAB — GLUCOSE, CAPILLARY
Glucose-Capillary: 131 mg/dL — ABNORMAL HIGH (ref 70–99)
Glucose-Capillary: 133 mg/dL — ABNORMAL HIGH (ref 70–99)
Glucose-Capillary: 126 mg/dL — ABNORMAL HIGH (ref 70–99)
Glucose-Capillary: 137 mg/dL — ABNORMAL HIGH (ref 70–99)

## 2021-01-09 MED ORDER — TORSEMIDE 20 MG PO TABS
40.0000 mg | ORAL_TABLET | Freq: Every day | ORAL | Status: DC
  Administered 2021-01-09 – 2021-01-10 (×2): 40 mg via ORAL
  Filled 2021-01-09 (×2): qty 2

## 2021-01-09 MED ORDER — POTASSIUM CHLORIDE CRYS ER 20 MEQ PO TBCR
40.0000 meq | EXTENDED_RELEASE_TABLET | Freq: Once | ORAL | Status: AC
  Administered 2021-01-09: 40 meq via ORAL
  Filled 2021-01-09: qty 2

## 2021-01-09 NOTE — Progress Notes (Addendum)
Progress Note  Patient Name: [name] Date of Encounter: 01/09/2021  [facility] Cardiologist: Sanda Klein, MD   Subjective   Feels she is at her dry wt, says was 235 before she came in.  Has problems w/ feeling something is in the back of her throat, it gives her problems swallowing at times. Also will hear herself wheeze sometimes at night and have trouble breathing/swallowing.   VSS on home 3 lpm O2  Crt 1.51->1.35->1.36>1.52 w/ baseline < 1.2 2.25 L out yesterday, net -3 L since admit Alb 2.8   Wt Readings from Last 3 Encounters:  01/09/21 102.6 kg  12/31/20 103 kg  12/18/20 103 kg     Inpatient Medications    Scheduled Meds:  aspirin EC  81 mg Oral Daily   bisoprolol  10 mg Oral Daily   clopidogrel  75 mg Oral Daily   empagliflozin  10 mg Oral QAC breakfast   ferrous sulfate  325 mg Oral Q breakfast   furosemide  80 mg Intravenous BID   heparin  5,000 Units Subcutaneous Q8H   insulin aspart  0-15 Units Subcutaneous TID WC   isosorbide mononitrate  90 mg Oral Daily   mometasone-formoterol  2 puff Inhalation BID   pantoprazole  40 mg Oral BID   polyvinyl alcohol  2 drop Both Eyes BID   ranolazine  1,000 mg Oral BID   rosuvastatin  40 mg Oral QPM   sodium chloride flush  3 mL Intravenous Q12H   Continuous Infusions:  sodium chloride     PRN Meds: sodium chloride, acetaminophen, cyclobenzaprine, hydrALAZINE, ipratropium-albuterol, ondansetron (ZOFRAN) IV, sodium chloride flush   Vital Signs    Vitals:   01/08/21 1932 01/09/21 0001 01/09/21 0404 01/09/21 0844  BP: 127/70 (!) 141/61 128/75 130/65  Pulse: 70 65 70 65  Resp: 18 17 19 18   Temp: 98.3 F (36.8 C) 98.5 F (36.9 C) 97.7 F (36.5 C) 98.1 F (36.7 C)  TempSrc: Oral Oral Oral Oral  SpO2: 97% 98% 98% 97%  Weight:   102.6 kg   Height:        Intake/Output Summary (Last 24 hours) at 01/09/2021 0947 Last data filed at 01/09/2021 0843 Gross per 24 hour  Intake 952 ml  Output  2250 ml  Net -1298 ml   Last 3 Weights 01/09/2021 01/08/2021 01/07/2021  Weight (lbs) 226 lb 3.2 oz 228 lb 6.3 oz 135 lb  Weight (kg) 102.604 kg 103.6 kg 61.236 kg      Telemetry    V paced - Personally Reviewed  ECG    None today - Personally Reviewed  Physical Exam   GEN: No acute distress.   Neck: JVD mildly elevated Cardiac: RRR, no murmur, no rubs, or gallops.  Respiratory: diminished to auscultation bilaterally with a few rales in the bases. GI: Soft, nontender, non-distended  MS: No edema; No deformity. Neuro:  Nonfocal  Skin: PPM site w/out ecchymosis or hematoma Psych: Normal affect   Labs    High Sensitivity Troponin:   Recent Labs  Lab 01/07/21 0419 01/07/21 0618  TROPONINIHS 20* 17     Chemistry Recent Labs  Lab 01/07/21 0419 01/07/21 1636 01/08/21 0647 01/09/21 0340  NA 139  --  139 135  K 3.4*  --  4.2 3.4*  CL 97*  --  97* 92*  CO2 35*  --  36* 34*  GLUCOSE 131*  --  128* 125*  BUN 18  --  16 20  CREATININE 1.51* 1.35* 1.36* 1.52*  CALCIUM 8.7*  --  9.2 9.3  PROT 6.2*  --   --   --   ALBUMIN 2.8*  --   --   --   AST 15  --   --   --   ALT 14  --   --   --   ALKPHOS 105  --   --   --   BILITOT 0.5  --   --   --   GFRNONAA 36* 41* 40* 35*  ANIONGAP 7  --  6 9    Lipids No results for input(s): CHOL, TRIG, HDL, LABVLDL, LDLCALC, CHOLHDL in the last 168 hours.  Hematology Recent Labs  Lab 01/07/21 0419 01/07/21 1636  WBC 5.5 5.7  RBC 3.67* 3.67*  HGB 10.8* 10.8*  HCT 36.2 36.5  MCV 98.6 99.5  MCH 29.4 29.4  MCHC 29.8* 29.6*  RDW 14.6 14.6  PLT 157 164   Thyroid No results for input(s): TSH, FREET4 in the last 168 hours.  BNP Recent Labs  Lab 01/07/21 0419  BNP 338.7*    DDimer No results for input(s): DDIMER in the last 168 hours.  Lab Results  Component Value Date   HGBA1C 6.5 (H) 01/08/2021   Magnesium  Date Value Ref Range Status  06/02/2019 2.1 1.7 - 2.4 mg/dL Final    Comment:    Performed at [facility], [place] [address] [place], [place] [address]    Radiology    No results found.  Cardiac Studies   None this admit  Patient Profile     [name] is a 75 y.o. female with a past medical history of CAD status post CABG '16 (LIMA to LAD, SVG to D1, SVG to OM1 and OM2, SVG to PDA), HFpEF, obesity, adrenal insufficiency, diabetes, hypertension, CKD stage III, OSA/obesity-hypoventilation syndrome on 3 L O2 chronically, complete heart block status post PPM/ICD who is being seen 01/07/2021 for the evaluation of chest pain at the request of Dr. Vanita Panda.   Assessment & Plan    #Chest Pain: hx of CABG '16 (LIMA to LAD, SVG to D1, SVG to OM1 and OM2, SVG to PDA),  Cath 03/11/2019 showed stable known native dx. Chronic occluded SVG to OM. All other grafts patent. Wedge 14, no sig pulmonary htn.  - CP was mild and has improved - recent gen change not responsible for the pain - no further eval indicated - cont current cardiac meds, including increased bisoprolol   #Decompensated Hfpef: EF 55-60.  - some extra volume on board, improved w/ diuresis - thinks she is at her dry wt now - BUN/Cr trending up, can probably change to po Lasix today or in am - continue SGLT2   #OSA/Obesity Hyperventilation:  - on home O2 at 3 lpm - also has OSA, did not tolerate CPAP   #Hypertension:   - SBP trended down since yesterday - SBP 117-141 last 24 hr - on bisoprolol 10 mg qd, Imdur 90 mg qd - discuss w/ MD adding hydralazine vs low-dose losartan   #CHB: S/p PPM CRT-P. [employer]  - now SR, V pacing - no signs of infection at site   #DM2 - on Jardiance home rx - on SSI here, getting 2-3 U per check - A1c 6.5  #GERD - on home dose Protonix 40 mg bid - sees [facility] - having sx, ?from GERD - F/U with GI  FENGI: HH diet Access: PIV  Prophylaxis:heparin sub Q Code: Full Dispo: admission to cardiology  Plan: PT to see     For questions or updates, please contact [place] Please consult [email or web address] for contact info under        Signed, Rosaria Ferries, PA-C  01/09/2021, 9:47 AM

## 2021-01-09 NOTE — Progress Notes (Signed)
Heart Failure Nurse Navigator Progress Note  Following for HV TOC readiness. Pt states she lives with son, is home alone during day. Son is looking into senior "daycare"--pt is excited about socialization and getting out of the house. States December is a hard time for her as her spouse passed 01/11/2012. Feels daycare will make her more active and less depressed feeling.  Pt on home dose O2 @ 3LPM. States she does most of the cooking, follows low salt diet, utilizes [employer]. Son or DIL drives pt to/from appts.  C/o throat feels heavy--coughing up thick clear phlegm.    Pricilla Holm, MSN, RN Heart Failure Nurse Navigator [phone]

## 2021-01-09 NOTE — Progress Notes (Signed)
Potassium = 3.4. MD paged.

## 2021-01-09 NOTE — Plan of Care (Signed)

## 2021-01-09 NOTE — Evaluation (Signed)
Physical Therapy Evaluation Patient Details Name: [name] MRN: [id number] DOB: 03/16/1945 Today's Date: 01/09/2021  History of Present Illness  The pt is a 75 yo female presenting 11/28 with SOB. Of note, pt with pacemaker replacement 11/21. Chest pain resolved upon admission, pt with mild volume increase with plan to diurese. PMH includes: CAD s/p CABG x5 in 2016, arthritis, CHF, chronic low back pain, HTN, obesity, use of 2L O2 at baseline, DM II, stroke, TIA, and cervical fusion.   Clinical Impression  Pt in bed upon arrival of PT, agreeable to evaluation at this time. Prior to admission the pt was mobilizing without use of DME in the home, completing all ADLs and IADLs without assist. The pt now presents with limitations in functional mobility, activity tolerance, and dynamic stability due to above dx, and will continue to benefit from skilled PT to address these deficits. The pt was able to complete multiple short bouts of hallway ambulation (~65-75 ft) with use of rollator and minG. She initially needed verbal cues for use for safety features, but was then able to adopt cues without repeated cues. The pt reports she is eager to progress to full independence and have greater community participation as she has felt isolated and lonely at her house recently. She will be safe for return home with short stint of increased supervision from family/friends.      Recommendations for follow up therapy are one component of a multi-disciplinary discharge planning process, led by the attending physician.  Recommendations may be updated based on patient status, additional functional criteria and insurance authorization.  Follow Up Recommendations Home health PT    Assistance Recommended at Discharge Intermittent Supervision/Assistance  Functional Status Assessment Patient has had a recent decline in their functional status and demonstrates the ability to make significant improvements in function  in a reasonable and predictable amount of time.  Equipment Recommendations  None recommended by PT    Recommendations for Other Services       Precautions / Restrictions Precautions Precautions: Fall Precaution Comments: pt on 3L O2 for mobility Restrictions Weight Bearing Restrictions: No      Mobility  Bed Mobility Overal bed mobility: Modified Independent             General bed mobility comments: increased use of bed rails, but pt able to complete without assist    Transfers Overall transfer level: Needs assistance Equipment used: Rollator (4 wheels);None Transfers: Sit to/from Stand Sit to Stand: Min guard           General transfer comment: minG both without use of DME and with use of rollator. cues first time for hand placement and use of brakes on rollator    Ambulation/Gait Ambulation/Gait assistance: Supervision Gait Distance (Feet): 65 Feet (x4) Assistive device: Rollator (4 wheels) Gait Pattern/deviations: Step-through pattern;Decreased stride length;Trunk flexed Gait velocity: 0.5 m/s Gait velocity interpretation: 1.31 - 2.62 ft/sec, indicative of limited community ambulator   General Gait Details: pt with slight trunk flexion using 4-wheel walker. cues initially for safety features (use of breaks) but pt then able to apply without assist/cues      Balance Overall balance assessment: Needs assistance Sitting-balance support: No upper extremity supported Sitting balance-Leahy Scale: Good Sitting balance - Comments: able to reach outside of BOS   Standing balance support: Bilateral upper extremity supported Standing balance-Leahy Scale: Poor Standing balance comment: BUE support for gait  Pertinent Vitals/Pain Pain Assessment: 0-10 Pain Score: 6  Pain Location: chronic back pain Pain Descriptors / Indicators: Discomfort;Sore Pain Intervention(s): Limited activity within patient's tolerance;Monitored  during session;Repositioned    Home Living Family/patient expects to be discharged to:: Private residence Living Arrangements: Children Available Help at Discharge: Available PRN/intermittently;Family Type of Home: Mobile home Home Access: Stairs to enter Entrance Stairs-Rails: Left Entrance Stairs-Number of Steps: 3   Home Layout: One level Home Equipment: Tub bench;Rolling Walker (2 wheels) Additional Comments: son now works during days, not available to help    Prior Function Prior Level of Function : Independent/Modified Independent             Mobility Comments: pt reports independence with mobility, does furniture walk ADLs Comments: reports unable to clean her home due to fatigue/weakness. hasn't been driving in a few months     Hand Dominance   Dominant Hand: Right    Extremity/Trunk Assessment   Upper Extremity Assessment Upper Extremity Assessment: Overall WFL for tasks assessed    Lower Extremity Assessment Lower Extremity Assessment: Overall WFL for tasks assessed    Cervical / Trunk Assessment Cervical / Trunk Assessment: Normal;Other exceptions Cervical / Trunk Exceptions: large body habitus  Communication   Communication: No difficulties  Cognition Arousal/Alertness: Awake/alert Behavior During Therapy: WFL for tasks assessed/performed Overall Cognitive Status: Within Functional Limits for tasks assessed                                 General Comments: pt following all cues appropriately, slight cues for safety/line management but pt then able to maintain without repeated cues        General Comments General comments (skin integrity, edema, etc.): VSS on 3L O2        Assessment/Plan    PT Assessment Patient needs continued PT services  PT Problem List Decreased strength;Decreased range of motion;Decreased activity tolerance;Decreased mobility;Decreased balance       PT Treatment Interventions DME instruction;Gait  training;Stair training;Functional mobility training;Therapeutic activities;Therapeutic exercise;Balance training;Patient/family education    PT Goals (Current goals can be found in the Care Plan section)  Acute Rehab PT Goals Patient Stated Goal: get back to walking and able to clean her house PT Goal Formulation: With patient Time For Goal Achievement: 01/23/21 Potential to Achieve Goals: Good    Frequency Min 3X/week    AM-PAC PT "6 Clicks" Mobility  Outcome Measure Help needed turning from your back to your side while in a flat bed without using bedrails?: None Help needed moving from lying on your back to sitting on the side of a flat bed without using bedrails?: None Help needed moving to and from a bed to a chair (including a wheelchair)?: A Little Help needed standing up from a chair using your arms (e.g., wheelchair or bedside chair)?: A Little Help needed to walk in hospital room?: A Little Help needed climbing 3-5 steps with a railing? : A Little 6 Click Score: 20    End of Session Equipment Utilized During Treatment: Gait belt;Oxygen Activity Tolerance: Patient tolerated treatment well Patient left: in bed;with call bell/phone within reach;with bed alarm set Nurse Communication: Mobility status PT Visit Diagnosis: Other abnormalities of gait and mobility (R26.89);Muscle weakness (generalized) (M62.81);Unsteadiness on feet (R26.81)    Time: 2683-4196 PT Time Calculation (min) (ACUTE ONLY): 31 min   Charges:   PT Evaluation $PT Eval Low Complexity: 1 Low PT Treatments $Gait Training: 8-22 mins  [place], PT, DPT   Acute Rehabilitation Department Pager #: [phone]  Sandra Cockayne 01/09/2021, 2:37 PM

## 2021-01-09 NOTE — Progress Notes (Signed)
Mobility Specialist Progress Note:   01/09/21 1100  Mobility  Activity Ambulated in hall  Level of Assistance Standby assist, set-up cues, supervision of patient - no hands on  Assistive Device Front wheel walker  Distance Ambulated (ft) 60 ft  Mobility Ambulated with assistance in hallway  Mobility Response Tolerated well  Mobility performed by Mobility specialist  Bed Position Chair  $Mobility charge 1 Mobility   Pt received in bed willing to participate in mobility. No complaints of pain. Pt left at chair by sink to get washed up with call bell in reach and all needs met.   [facility] Public librarian Phone [phone] Secondary Phone [phone]

## 2021-01-09 NOTE — Progress Notes (Signed)
  Pt missed wound check due to admission.  Site stable without bleeding or edema.  Steri-strips removed. Site well healing and edges well approximated.  Interrogation pending and to be placed in chart  [employer], [place]

## 2021-01-10 LAB — BASIC METABOLIC PANEL
Anion gap: 13 (ref 5–15)
BUN: 26 mg/dL — ABNORMAL HIGH (ref 8–23)
CO2: 29 mmol/L (ref 22–32)
Calcium: 9.2 mg/dL (ref 8.9–10.3)
Chloride: 95 mmol/L — ABNORMAL LOW (ref 98–111)
Creatinine, Ser: 1.76 mg/dL — ABNORMAL HIGH (ref 0.44–1.00)
GFR, Estimated: 30 mL/min — ABNORMAL LOW (ref >60)
Glucose, Bld: 166 mg/dL — ABNORMAL HIGH (ref 70–99)
Potassium: 4.2 mmol/L (ref 3.5–5.1)
Sodium: 137 mmol/L (ref 135–145)

## 2021-01-10 LAB — GLUCOSE, CAPILLARY
Glucose-Capillary: 141 mg/dL — ABNORMAL HIGH (ref 70–99)
Glucose-Capillary: 124 mg/dL — ABNORMAL HIGH (ref 70–99)

## 2021-01-10 MED ORDER — TORSEMIDE 40 MG PO TABS: 40.0000 mg | ORAL_TABLET | Freq: Every day | ORAL | 6 refills | Status: DC

## 2021-01-10 MED ORDER — ORAL CARE MOUTH RINSE
15.0000 mL | Freq: Two times a day (BID) | OROMUCOSAL | Status: DC
  Administered 2021-01-10: 15 mL via OROMUCOSAL

## 2021-01-10 MED ORDER — GUAIFENESIN ER 600 MG PO TB12: 600.0000 mg | ORAL_TABLET | Freq: Two times a day (BID) | ORAL | 1 refills | Status: DC

## 2021-01-10 MED ORDER — BISOPROLOL FUMARATE 10 MG PO TABS: 10.0000 mg | ORAL_TABLET | Freq: Every day | ORAL | 6 refills | Status: DC

## 2021-01-10 MED ORDER — GUAIFENESIN ER 600 MG PO TB12
600.0000 mg | ORAL_TABLET | Freq: Two times a day (BID) | ORAL | Status: DC
  Administered 2021-01-10: 600 mg via ORAL
  Filled 2021-01-10: qty 1

## 2021-01-10 NOTE — Progress Notes (Signed)
Mobility Specialist Progress Note:   01/10/21 1134  Mobility  Activity Ambulated in hall  Level of Assistance Standby assist, set-up cues, supervision of patient - no hands on  Assistive Device Front wheel walker  Distance Ambulated (ft) 180 ft  Mobility Ambulated with assistance in hallway  Mobility Response Tolerated well  Mobility performed by Mobility specialist  $Mobility charge 1 Mobility   Pt received in bed willing to participate in mobility. No complaints of pain. Pt required 2 standing rest breaks to "catch breath". Pt returned to bed with call bell in reach and all needs met.   [facility] Public librarian Phone [phone] Secondary Phone [phone]

## 2021-01-10 NOTE — Progress Notes (Signed)
Physical Therapy Treatment Patient Details Name: [name] MRN: [id number] DOB: 02/04/1946 Today's Date: 01/10/2021   History of Present Illness The pt is a 74 yo female presenting 11/28 with SOB. Of note, pt with pacemaker replacement 11/21. Chest pain resolved upon admission, pt with mild volume increase with plan to diurese. PMH includes: CAD s/p CABG x5 in 2016, arthritis, CHF, chronic low back pain, HTN, obesity, use of 2L O2 at baseline, DM II, stroke, TIA, and cervical fusion.    PT Comments    Pt had already ambulated with mobility specialist earlier today and report she is discharging home soon today, thereby requesting to defer from ambulating with PT at this time. However, pt requesting to create a HEP she can perform until the [place] arrives. Thus, session focused on educating pt on HEP handout provided and how to safely progress her exercises to improve her lower extremity weakness. Will continue to follow acutely. Current recommendations remain appropriate.   Recommendations for follow up therapy are one component of a multi-disciplinary discharge planning process, led by the attending physician.  Recommendations may be updated based on patient status, additional functional criteria and insurance authorization.  Follow Up Recommendations  Home health PT     Assistance Recommended at Discharge Intermittent Supervision/Assistance  Equipment Recommendations  None recommended by PT    Recommendations for Other Services       Precautions / Restrictions Precautions Precautions: Fall Precaution Comments: pt on 3L O2 for mobility Restrictions Weight Bearing Restrictions: No     Mobility  Bed Mobility               General bed mobility comments: Pt sitting up EOB upon arrival.    Transfers Overall transfer level: Needs assistance Equipment used: None Transfers: Sit to/from Stand Sit to Stand: Supervision           General transfer comment: Supervision  for safety coming to stand pushing off bed rail.    Ambulation/Gait               General Gait Details: Pt requesting to defer as she is planning to d/c soon and mobilized with mobility specialist earlier. Pt requesting to focus session on building a HEP.   Stairs             Wheelchair Mobility    Modified Rankin (Stroke Patients Only)       Balance Overall balance assessment: Needs assistance Sitting-balance support: No upper extremity supported Sitting balance-Leahy Scale: Good     Standing balance support: No upper extremity supported Standing balance-Leahy Scale: Fair Standing balance comment: Static standing without UE support briefly.                            Cognition Arousal/Alertness: Awake/alert Behavior During Therapy: WFL for tasks assessed/performed Overall Cognitive Status: Within Functional Limits for tasks assessed                                          Exercises Other Exercises Other Exercises: Educated pt on HEP provided, which consisted of sit <> stands, standing hip flexion, standing hip abduction, and seated hip adduction pillow squeezes    General Comments General comments (skin integrity, edema, etc.): HEP Access Code: [email or web address] on MedBridge; Educated pt on progressing her HEP with use of ankle weights and less assistance from  UEs in safe manner      Pertinent Vitals/Pain Pain Assessment: Faces Faces Pain Scale: No hurt Pain Intervention(s): Monitored during session    Home Living                          Prior Function            PT Goals (current goals can now be found in the care plan section) Acute Rehab PT Goals Patient Stated Goal: to get a HEP PT Goal Formulation: With patient Time For Goal Achievement: 01/23/21 Potential to Achieve Goals: Good Progress towards PT goals: Progressing toward goals    Frequency    Min 3X/week      PT Plan Current plan remains  appropriate    Co-evaluation              AM-PAC PT "6 Clicks" Mobility   Outcome Measure  Help needed turning from your back to your side while in a flat bed without using bedrails?: None Help needed moving from lying on your back to sitting on the side of a flat bed without using bedrails?: None Help needed moving to and from a bed to a chair (including a wheelchair)?: A Little Help needed standing up from a chair using your arms (e.g., wheelchair or bedside chair)?: A Little Help needed to walk in hospital room?: A Little Help needed climbing 3-5 steps with a railing? : A Little 6 Click Score: 20    End of Session Equipment Utilized During Treatment: Oxygen Activity Tolerance: Patient tolerated treatment well Patient left: in bed;with call bell/phone within reach   PT Visit Diagnosis: Other abnormalities of gait and mobility (R26.89);Muscle weakness (generalized) (M62.81);Unsteadiness on feet (R26.81)     Time: 6283-1517 PT Time Calculation (min) (ACUTE ONLY): 9 min  Charges:  $Therapeutic Exercise: 8-22 mins                     Moishe Spice, PT, DPT Acute Rehabilitation Services  Pager: [phone] Office: [place] 01/10/2021, 2:37 PM

## 2021-01-10 NOTE — Progress Notes (Signed)
Heart Failure Nurse Navigator Progress Note  Visited pt today to check on the anniversary of husband's passing---comforted patient.   Pt declined HV TOC clinic appt upon DC from hospitalization. Pt educated on benefits of HV TOC. Pt education complete regarding HF patient booklet.  Pt has follow up with Dr. [name] 12/20.    Pricilla Holm, MSN, RN Heart Failure Nurse Navigator [phone]

## 2021-01-10 NOTE — Discharge Instructions (Signed)
Weigh daily if you have scales and call the office -Dr. Lorenza Cambridge -if weight increases by 3 pounds in a day or 5 pounds in a week.   Low salt diabetic diet   We increased your torsemide to 40 mg daily and bisoprolol to 10 mg daily.    You will be seen at HF specialty clinic initially to close management  -  then back to Dr. [name].    Continue home 0xygen as before.  Home health PT will follow you at home.

## 2021-01-10 NOTE — Discharge Summary (Signed)
Discharge Summary    Patient ID: [name] MRN: [id number]; DOB: 1945-10-31  Admit date: 01/07/2021 Discharge date: 01/10/2021  PCP:  Alicia Amel, MD   [facility] [facility] Cardiologist:  Sanda Klein, MD        Discharge Diagnoses    Principal Problem:   CHF (congestive heart failure) ([place]) Active Problems:   HTN (hypertension), benign   Cardiac resynchronization therapy pacemaker (CRT-P) in place   Morbid obesity ([place])   Gastroesophageal reflux disease   OSA (obstructive sleep apnea)   Acute diastolic CHF (congestive heart failure) ([place])   S/P CABG x 5   CKD (chronic kidney disease), stage III ([place])   DM2 (diabetes mellitus, type 2) ([place])   Chest pain of uncertain etiology   Chronic diastolic heart failure ([place])     Diagnostic Studies/Procedures    None this admit _____________   History of Present Illness     [name] is a 75 y.o. female with PMH of CAD status post CABG '16 (LIMA to LAD, SVG to D1, SVG to OM1 and OM2, SVG to PDA), HFpEF, obesity, adrenal insufficiency, diabetes, hypertension, CKD stage III, OSA/obesity-hypoventilation syndrome on 3 L O2 chronically, complete heart block status post PPM/ICD and PAD with RICA stent in 09/2020 who was admitted 01/07/2021 for chest pain.  She did not feel well day prior to admit of 01/07/21 but after going to bed woke with sharp chest pain on left.  Pain waxed and waned and when it did not resolved EMS called.  She was found to have increased edema of lower ext.  She is on home 02 3 L at home.  She in addition to lower ext edema noticed increase in abd fullness and back pain.  She took 2 NTG at home with some improvement in symptoms.   Also noted recent Gen change but this is on Rt.   EKG and labs stable , pt admitted for diuresis and monitor chest pain.   Hospital Course     Consultants: EP to look at pacer site gen change.   Hs troponin neg at 20 and 17.  IV lasix 80 mg IV BID given.  She  improved over next 2 days.   She is neg 2.7 L and wt down from 103.6 to 102.7 kg.    EP evaluated her PPM site and it was interrogated and stable.  She had Gen change 12/31/20, no lead changes.    PCXR with cardiomegaly with pulmonary vascular congestion.   She remains on her imdur and ranexa.  With neg troponin no ischemic eval.  Continue plavix, asa and statin.  Her bisoprolol was increased to 10 mg daily.  She paced during hospitalization.  DM-2 was stable on SS insulin.   Prior to discharge PT evaluated and recommended home PT to increase energy and for safety. She still has DOE but at baseline.  We did increase her home torsemide to 40 mg daily.  She will need BMP on follow up visit in 11 days.   Dry weight can be 103 kg BNP on admission 338    Wt Readings from Last 3 Encounters:  01/10/21 102.7 kg  12/31/20 103 kg  12/18/20 103 kg   Pt has been seen and evaluated by [name] and found stable for discharge.   Did the patient have an acute coronary syndrome (MI, NSTEMI, STEMI, etc) this admission?:  No  Did the patient have a percutaneous coronary intervention (stent / angioplasty)?:  No.        The patient will be scheduled for a TOC follow up appointment in HF specialty clinic  Discharge Vitals Blood pressure 100/64, pulse 65, temperature 98.3 F (36.8 C), temperature source Oral, resp. rate 20, height 5\' 3"  (1.6 m), weight 102.7 kg, SpO2 99 %.  Filed Weights   01/08/21 1539 01/09/21 0404 01/10/21 0313  Weight: 103.6 kg 102.6 kg 102.7 kg    Labs & Radiologic Studies    CBC Recent Labs    01/07/21 1636  WBC 5.7  HGB 10.8*  HCT 36.5  MCV 99.5  PLT 010   Basic Metabolic Panel Recent Labs    01/09/21 0340 01/10/21 0039  NA 135 137  K 3.4* 4.2  CL 92* 95*  CO2 34* 29  GLUCOSE 125* 166*  BUN 20 26*  CREATININE 1.52* 1.76*  CALCIUM 9.3 9.2   Liver Function Tests No results for input(s): AST, ALT, ALKPHOS, BILITOT, PROT, ALBUMIN in  the last 72 hours. No results for input(s): LIPASE, AMYLASE in the last 72 hours. High Sensitivity Troponin:   Recent Labs  Lab 01/07/21 0419 01/07/21 0618  TROPONINIHS 20* 17    BNP Invalid input(s): POCBNP D-Dimer No results for input(s): DDIMER in the last 72 hours. Hemoglobin A1C Recent Labs    01/08/21 0647  HGBA1C 6.5*   Fasting Lipid Panel No results for input(s): CHOL, HDL, LDLCALC, TRIG, CHOLHDL, LDLDIRECT in the last 72 hours. Thyroid Function Tests No results for input(s): TSH, T4TOTAL, T3FREE, THYROIDAB in the last 72 hours.  Invalid input(s): FREET3 _____________  EP PPM/ICD IMPLANT  Result Date: 12/31/2020 Procedure performed: 1. Biventricular pacemaker (CRT-P) generator changeout 2. Aegis pouch Reason for procedure: 1. Device generator malfunction (under advisory) and elective replacement interval Procedure performed by: Sanda Klein, MD Complications: None Estimated blood loss: <5 mL Medications administered during procedure: Vancomycin 1 g intravenously, lidocaine 1% 30 mL locally Device details: New Generator [facility] [employer] CRT-P model number X323, serial number [id number] Right atrial lead (chronic) BSC 4285, serial [id number] (implanted 2001) Right ventricular lead (chronic)  BSC 4035, serial number [id number] (implanted 2001) Left ventricular lead  BSC 2831, serial number [id number] (implanted 2008) Explanted generator BSC V273, serial number  [id number] (implanted 11/02/2012) Procedure details: After the risks and benefits of the procedure were discussed the patient provided informed consent. She was brought to the cardiac catheter lab in the fasting state. The patient was prepped and draped in usual sterile fashion. Local anesthesia with 1% lidocaine was administered to to the left infraclavicular area. A 5-6cm horizontal incision was made parallel with and 2-3 cm caudal to the right clavicle, in the area of an old scar. Two older scars were seen closer to the left clavicle.  Using minimal electrocautery and mostly sharp and blunt dissection the prepectoral pocket was opened carefully to avoid injury to the loops of chronic leads. Extensive dissection was not necessary. The device was explanted. The pocket was carefully inspected for hemostasis and flushed with copious amounts of antibiotic solution. The leads were disconnected from the old generator and the new generator was connected to the chronic leads, with appropriate pacing noted. Testing of the lead parameters via telemetry showed stable values. The chronic right atrial lead is known to have appropriate sensing, but prohibitively high pacing threshold (device programmed VDD). The new generator and leads were placed in an AEGIS pouch. The entire system was then carefully inserted  in the pocket with care taken that the leads and device assumed a comfortable position without pressure on the incision. Great care was taken that the leads be located deep to the generator. The pocket was then closed in layers using 2 layers of 2-0 Vicryl, one layer of 3-0 Vicryl and cutaneous steristrips after which a sterile dressing was applied. At the end of the procedure the following lead parameters were encountered: Right atrial lead sensed P waves 0.8 mV, impedance 681 ohms, threshold above limit Right ventricular lead sensed R waves  none detected (dependent) mV, impedance 768 ohms, threshold 1.4 at 0.4 ms pulse width. Left ventricular lead impedance 950 ohms, threshold 1.3 at 1.0 ms pulse width.   DG Chest Portable 1 View  Result Date: 01/07/2021 CLINICAL DATA:  Chest pain EXAM: PORTABLE CHEST 1 VIEW COMPARISON:  12/09/2020 FINDINGS: 0537 hours. The cardio pericardial silhouette is enlarged. There is pulmonary vascular congestion without overt pulmonary edema. Status post CABG. Right-sided permanent pacemaker/AICD again noted. Stable scarring left base. Bones are diffusely demineralized. IMPRESSION: Cardiomegaly with pulmonary vascular  congestion. Electronically Signed   By: Misty Stanley M.D.   On: 01/07/2021 05:52   VAS US CAROTID  Result Date: 12/18/2020 Carotid Arterial Duplex Study Patient Name:  LATANGELA MCCOMAS  Date of Exam:   12/18/2020 Medical Rec #: [id number]          Accession #:    [id number] Date of Birth: 1945/12/01           Patient Gender: F Patient Age:   75 years Exam Location:  [facility] Procedure:      VAS US CAROTID Referring Phys: Monica Martinez --------------------------------------------------------------------------------  Risk Factors:      Hypertension, hyperlipidemia, Diabetes. Other Factors:     S/P Right TCAR 10/01/20. Limitations        Today's exam was limited due to the body habitus of the                    patient and the patient's respiratory variation. Comparison Study:  09/17/20 Performing Technologist: June Leap RDMS, RVT  Examination Guidelines: A complete evaluation includes B-mode imaging, spectral Doppler, color Doppler, and power Doppler as needed of all accessible portions of each vessel. Bilateral testing is considered an integral part of a complete examination. Limited examinations for reoccurring indications may be performed as noted.  Right Carotid Findings: +----------+--------+--------+--------+------------------+--------+           PSV cm/sEDV cm/sStenosisPlaque DescriptionComments +----------+--------+--------+--------+------------------+--------+ CCA Prox  99      12                                         +----------+--------+--------+--------+------------------+--------+ CCA Distal55      17                                         +----------+--------+--------+--------+------------------+--------+ ICA Mid   148     36                                         +----------+--------+--------+--------+------------------+--------+ ICA Distal127     40                                          +----------+--------+--------+--------+------------------+--------+  ECA       172     26                                         +----------+--------+--------+--------+------------------+--------+ +----------+--------+-------+----------------+-------------------+           PSV cm/sEDV cmsDescribe        Arm Pressure (mmHG) +----------+--------+-------+----------------+-------------------+ [id number]            Multiphasic, WNL                    +----------+--------+-------+----------------+-------------------+ +---------+--------+--+--------+--+---------+ VertebralPSV cm/s50EDV cm/s16Antegrade +---------+--------+--+--------+--+---------+  Right Stent(s): +--------------+--------+--------+--------+--------+--------+ ICA           PSV cm/sEDV cm/sStenosisWaveformComments +--------------+--------+--------+--------+--------+--------+ Proximal Stent77      25                               +--------------+--------+--------+--------+--------+--------+ Mid Stent     115     30                               +--------------+--------+--------+--------+--------+--------+ Distal Stent  117     27                               +--------------+--------+--------+--------+--------+--------+    Summary: Right Carotid: Patent right ICA stent <50% stenosis.  *See table(s) above for measurements and observations.  Electronically signed by Monica Martinez MD on 12/18/2020 at 3:05:05 PM.    Final    Disposition   Pt is being discharged home today in good condition.  Follow-up Plans & Appointments  Weigh daily if you have scales and call the office -Dr. Lorenza Cambridge -if weight increases by 3 pounds in a day or 5 pounds in a week.   Low salt diabetic diet   We increased your torsemide to 40 mg daily and bisoprolol to 10 mg daily.    You will be seen at HF specialty clinic initially to close management  -  then back to Dr. [name].     Continue home 0xygen as  before.  Home health PT will follow you at home.   Follow-up Information     [facility] Follow up.   Specialty: Gastroenterology Why: GO: DEC. 9 AT 10AM Contact information: [address] [place] [address] [phone]        [facility] Follow up.   Specialty: Home Health Services Why: HHPT, (new name is [place]) Contact information: [place] [address] [phone]         [facility] Follow up on 01/21/2021.   Specialty: Cardiology Why: at 9:00 AM - call the number for parking instructions and parking code.  after this visit you will go back to Dr. [name]. Contact information: [address] [id number] TS [id number] [place]        Alicia Amel, MD. Go on 01/23/2021.   Specialty: Family Medicine Why: @4 :30pm Contact information: [place] [address] [phone]         Sanda Klein, MD .   Specialty: Cardiology Contact information: [address] [place] [address] [phone]  Discharge Instructions     AMB Referral to [facility] Coordinaton   Complete by: As directed    [facility] Central Team:  Alicia Amel, MD, [facility] Physician, [place] location  Please assign to [place] Coordinator for complex care and disease management follow up calls and assess for further needs.  Questions please call:   Natividad Brood, RN BSN [facility] Liaison  [phone] business mobile phone Toll free office [phone]  Fax number: [phone] [email or web address]   Reason for Referral: [facility] Disease Management ([employer] payers)   Disease managment services needed: Nurse Case Manager   Diagnoses of:  Heart Failure Other     Other Diagnosis: anasarca   Expected  date of contact: Emergent - 3 Days       Discharge Medications   Allergies as of 01/10/2021       Reactions   Ivp Dye [iodinated Diagnostic Agents] Shortness Of Breath   No reaction to PO contrast with non-ionic dye.06-25-2014/rsm   Shellfish Allergy Anaphylaxis   Sulfa Antibiotics Shortness Of Breath   Iodine Hives   Atorvastatin Other (See Comments)   Pt states "causes bilateral leg pain/cramps."   Benadryl [diphenhydramine] Other (See Comments)   "THIS DRIVES ME CRAZY AND MAKES ME FEEL LIKE I AM DYING" No problem last time they took   Colchicine Nausea And Vomiting   Contrast Media [iodinated Diagnostic Agents] Hives   Doxycycline Other (See Comments)   Unknown reaction, per patient   Uloric [febuxostat] Other (See Comments)   Unknown reaction   Cephalexin Itching, Rash   Zithromax [azithromycin] Rash        Medication List     TAKE these medications    Accu-Chek Aviva Plus test strip Generic drug: glucose blood Check blood sugar twice daily   Accu-Chek Softclix Lancets lancets Check blood sugar twice daily   acetaminophen 500 MG tablet Commonly known as: TYLENOL Take 1,000 mg by mouth 2 (two) times daily as needed (for pain or headaches).   albuterol 108 (90 Base) MCG/ACT inhaler Commonly known as: VENTOLIN HFA Inhale 2 puffs into the lungs every 6 (six) hours as needed for wheezing or shortness of breath.   albuterol (2.5 MG/3ML) 0.083% nebulizer solution Commonly known as: PROVENTIL Take 3 mLs (2.5 mg total) by nebulization every 6 (six) hours as needed for wheezing or shortness of breath.   allopurinol 100 MG tablet Commonly known as: ZYLOPRIM TAKE 1 TABLET BY MOUTH  DAILY   aspirin EC 81 MG tablet Take 1 tablet (81 mg total) by mouth daily.   B-D SINGLE USE SWABS REGULAR Pads   bisoprolol 10 MG tablet Commonly known as: ZEBETA Take 1 tablet (10 mg total) by mouth daily. Start taking on: January 11, 2021   budesonide-formoterol 160-4.5  MCG/ACT inhaler Commonly known as: SYMBICORT Inhale 2 puffs into the lungs 2 (two) times daily.   carboxymethylcellulose 0.5 % Soln Commonly known as: REFRESH PLUS Place 1 drop into both eyes every 12 (twelve) hours.   clopidogrel 75 MG tablet Commonly known as: PLAVIX Take 1 tablet (75 mg total) by mouth daily. What changed: when to take this   cyclobenzaprine 10 MG tablet Commonly known as: FLEXERIL Take 5 mg by mouth 2 (two) times daily as needed for muscle spasms.   EPINEPHrine 0.3 mg/0.3 mL Soaj injection Commonly known as: EPI-PEN Inject 0.3 mg into the muscle once as needed for anaphylaxis (severe allergic reaction).   ferrous sulfate 325 (65 FE) MG tablet Take  325 mg by mouth daily with breakfast.   fluticasone 50 MCG/ACT nasal spray Commonly known as: FLONASE Place 1 spray into both nostrils daily.   guaiFENesin 600 MG 12 hr tablet Commonly known as: MUCINEX Take 1 tablet (600 mg total) by mouth 2 (two) times daily.   ipratropium 0.02 % nebulizer solution Commonly known as: ATROVENT Take 2.5 mLs (0.5 mg total) by nebulization 4 (four) times daily. What changed:  when to take this reasons to take this   isosorbide mononitrate 30 MG 24 hr tablet Commonly known as: IMDUR Take 90 mg by mouth daily.   Jardiance 10 MG Tabs tablet Generic drug: empagliflozin Take 10 mg by mouth daily before breakfast.   loratadine 10 MG tablet Commonly known as: CLARITIN Take 10 mg by mouth daily.   nitroGLYCERIN 0.4 MG SL tablet Commonly known as: NITROSTAT Place 1 tablet (0.4 mg total) under the tongue every 5 (five) minutes as needed for chest pain.   nystatin cream Commonly known as: MYCOSTATIN Apply 1 application topically 2 (two) times daily as needed (rash).   OXYGEN Inhale 3 L/min into the lungs continuous.   pantoprazole 40 MG tablet Commonly known as: PROTONIX Take 1 tablet (40 mg total) by mouth 2 (two) times daily.   polyethylene glycol 17 g  packet Commonly known as: MIRALAX / GLYCOLAX Take 17 g by mouth daily.   potassium chloride SA 20 MEQ tablet Commonly known as: KLOR-CON M Take 20 mEq by mouth every morning.   ranolazine 1000 MG SR tablet Commonly known as: RANEXA TAKE 1 TABLET BY MOUTH  TWICE DAILY What changed: how to take this   rosuvastatin 40 MG tablet Commonly known as: CRESTOR Take 1 tablet (40 mg total) by mouth every evening. NEED OV.   STOOL SOFTENER PO Take 1 tablet by mouth at bedtime.   sucralfate 1 g tablet Commonly known as: Carafate Take 1 tablet (1 g total) by mouth in the morning, at noon, and at bedtime. As needed What changed:  when to take this additional instructions   Torsemide 40 MG Tabs Take 40 mg by mouth daily. Start taking on: January 11, 2021 What changed:  medication strength how much to take            Outstanding Labs/Studies   BMP on visit.    Duration of Discharge Encounter   Greater than 30 minutes including physician time.  Signed, Cecilie Kicks, NP 01/10/2021, 1:46 PM

## 2021-01-10 NOTE — Progress Notes (Signed)
Progress Note  Patient Name: [name] Date of Encounter: 01/10/2021  [facility] Cardiologist: Sanda Klein, MD   Subjective   No chest pain or SOB feels ready to go home  Inpatient Medications    Scheduled Meds:  aspirin EC  81 mg Oral Daily   bisoprolol  10 mg Oral Daily   clopidogrel  75 mg Oral Daily   empagliflozin  10 mg Oral QAC breakfast   ferrous sulfate  325 mg Oral Q breakfast   heparin  5,000 Units Subcutaneous Q8H   insulin aspart  0-15 Units Subcutaneous TID WC   isosorbide mononitrate  90 mg Oral Daily   mouth rinse  15 mL Mouth Rinse BID   mometasone-formoterol  2 puff Inhalation BID   pantoprazole  40 mg Oral BID   polyvinyl alcohol  2 drop Both Eyes BID   ranolazine  1,000 mg Oral BID   rosuvastatin  40 mg Oral QPM   sodium chloride flush  3 mL Intravenous Q12H   torsemide  40 mg Oral Daily   Continuous Infusions:  sodium chloride     PRN Meds: sodium chloride, acetaminophen, cyclobenzaprine, hydrALAZINE, ipratropium-albuterol, ondansetron (ZOFRAN) IV, sodium chloride flush   Vital Signs    Vitals:   01/10/21 0035 01/10/21 0313 01/10/21 0718 01/10/21 0736  BP: (!) 148/79 (!) 101/56 135/82   Pulse: 69 68 68 68  Resp: 15 20 19 18   Temp: 98.1 F (36.7 C) 97.7 F (36.5 C) 98 F (36.7 C)   TempSrc: Oral Oral Oral   SpO2: 98% 96% 100% 100%  Weight:  102.7 kg    Height:        Intake/Output Summary (Last 24 hours) at 01/10/2021 0825 Last data filed at 01/10/2021 1275 Gross per 24 hour  Intake 1615 ml  Output 775 ml  Net 840 ml   Last 3 Weights 01/10/2021 01/09/2021 01/08/2021  Weight (lbs) 226 lb 6.4 oz 226 lb 3.2 oz 228 lb 6.3 oz  Weight (kg) 102.694 kg 102.604 kg 103.6 kg      Telemetry    Pacing with underlying CHB - Personally Reviewed  ECG    No new - Personally Reviewed  Physical Exam   GEN: No acute distress.   Neck: No JVD Cardiac: RRR, no murmurs, rubs, or gallops.  Respiratory: Clear to auscultation  bilaterally. GI: Soft, nontender, non-distended  MS: No edema; No deformity. Neuro:  Nonfocal  Psych: Normal affect   Labs    High Sensitivity Troponin:   Recent Labs  Lab 01/07/21 0419 01/07/21 0618  TROPONINIHS 20* 17     Chemistry Recent Labs  Lab 01/07/21 0419 01/07/21 1636 01/08/21 0647 01/09/21 0340 01/10/21 0039  NA 139  --  139 135 137  K 3.4*  --  4.2 3.4* 4.2  CL 97*  --  97* 92* 95*  CO2 35*  --  36* 34* 29  GLUCOSE 131*  --  128* 125* 166*  BUN 18  --  16 20 26*  CREATININE 1.51*   < > 1.36* 1.52* 1.76*  CALCIUM 8.7*  --  9.2 9.3 9.2  PROT 6.2*  --   --   --   --   ALBUMIN 2.8*  --   --   --   --   AST 15  --   --   --   --   ALT 14  --   --   --   --   ALKPHOS 105  --   --   --   --  BILITOT 0.5  --   --   --   --   GFRNONAA 36*   < > 40* 35* 30*  ANIONGAP 7  --  6 9 13    < > = values in this interval not displayed.    Lipids No results for input(s): CHOL, TRIG, HDL, LABVLDL, LDLCALC, CHOLHDL in the last 168 hours.  Hematology Recent Labs  Lab 01/07/21 0419 01/07/21 1636  WBC 5.5 5.7  RBC 3.67* 3.67*  HGB 10.8* 10.8*  HCT 36.2 36.5  MCV 98.6 99.5  MCH 29.4 29.4  MCHC 29.8* 29.6*  RDW 14.6 14.6  PLT 157 164   Thyroid No results for input(s): TSH, FREET4 in the last 168 hours.  BNP Recent Labs  Lab 01/07/21 0419  BNP 338.7*    DDimer No results for input(s): DDIMER in the last 168 hours.   Radiology    No results found.  Cardiac Studies   None this admit  Patient Profile     75 y.o. female with a past medical history of CAD status post CABG '16 (LIMA to LAD, SVG to D1, SVG to OM1 and OM2, SVG to PDA), HFpEF, obesity, adrenal insufficiency, diabetes, hypertension, CKD stage III, OSA/obesity-hypoventilation syndrome on 3 L O2 chronically, complete heart block status post PPM/ICD now with chest pain and CHF exacerbation.    Assessment & Plan    Chest pain:  hx of CABG '16 (LIMA to LAD, SVG to D1, SVG to OM1 and OM2, SVG to PDA),   Cath 03/11/2019 showed stable known native dx. Chronic occluded SVG to OM. All other grafts patent. Wedge 14, no sig pulmonary htn.  --improved no further eval. -on imdur and ranexa and plavix and ASA along with statin no changes --possible discharge today  Decompensated HFpEF EF 55-60% - at dry wt now with neg 2,491 since admit and wt down 1 Kg.  - increased home torsemide dose to 40 from 20 mg daily.  - PT has seen walks with walker  - pt lives with her son.  Follows low salt diet.    HTN   stable low 101/56 to 135/82 on bisoprolol 10 mg  Hx CHB now with PPM and CRT-P [employer].  SR with gen change out 12/31/20 -missed post procedure check up and site evaluated by EP and stable.  Interrogation done.  DM-2/GERD/OSA/Obesity stable on jardiance.   Glucose stable 125 to 166.   CKD-3b Cr up to 1.76 today up from 1.51 to 1.35.        For questions or updates, please contact [place] Please consult [email or web address] for contact info under        Signed, Cecilie Kicks, NP  01/10/2021, 8:25 AM

## 2021-01-10 NOTE — TOC Initial Note (Signed)
Transition of Care [facility]) - Initial/Assessment Note    Patient Details  Name: [name] MRN: [id number] Date of Birth: 13-Apr-1945  [facility]) CM/SW Contact:    Zenon Mayo, RN Phone Number: 01/10/2021, 9:57 AM  Clinical Narrative:                 NCM spoke with patient at bedside, offered choice , she states she wants who she had before, NCM looked it up and it was [place].  NCM made referral to Amy with [facility], she states she will send referral in , but the soc may be for Monday , patient states that is fine with her.  She has all the DME she needs at home and her son will have her grandson to stay with her for a couple days. Son will transport her home and will bring her oxygen tank to go home with. She has home oxygen with [employer] (3 liters).  Expected Discharge Plan: [place] Barriers to Discharge: Continued Medical Work up   Patient Goals and CMS Choice Patient states their goals for this hospitalization and ongoing recovery are:: return home with [facility] CMS Medicare.gov Compare Post Acute Care list provided to:: Patient Choice offered to / list presented to : Patient  Expected Discharge Plan and Services Expected Discharge Plan: [place]   Discharge Planning Services: CM Consult Post Acute Care Choice: [place] arrangements for the past 2 months: Single Family Home                   DME Agency: NA       HH Arranged: PT HH Agency: [place] Date [facility] Agency Contacted: 01/10/21 Time HH Agency Contacted: [phone] Representative spoke with at [place]: Amy  Prior Living Arrangements/Services Living arrangements for the past 2 months: [place] with:: Adult Children Patient language and need for interpreter reviewed:: Yes Do you feel safe going back to the place where you live?: Yes      Need for Family Participation in Patient Care: Yes (Comment) Care giver support system  in place?: Yes (comment) Current home services: DME (walker, bsc, tub bench, oxygen 3 liters with [employer]) Criminal Activity/Legal Involvement Pertinent to Current Situation/Hospitalization: No - Comment as needed  Activities of Daily Living      Permission Sought/Granted                  Emotional Assessment Appearance:: Appears stated age Attitude/Demeanor/Rapport: Engaged Affect (typically observed): Appropriate Orientation: : Oriented to  Time, Oriented to Situation, Oriented to Place, Oriented to Self Alcohol / Substance Use: Not Applicable Psych Involvement: No (comment)  Admission diagnosis:  Anasarca [R60.1] CHF (congestive heart failure) ([place]) [I50.9] Patient Active Problem List   Diagnosis Date Noted   CHF (congestive heart failure) ([place]) 01/07/2021   Chronic diastolic heart failure ([place]) 12/31/2020   Carotid stenosis 10/01/2020   Ischemic cardiomyopathy 09/27/2020   Aneurysm ([place]) 09/19/2020   Acute left-sided weakness 09/16/2020   Blood blister 08/01/2020   Elevated troponin 06/01/2019   Constipation 10/21/2018   Chronic headaches 07/30/2018   Congestive heart failure (CHF) ([place]) 07/30/2018   Supplemental oxygen dependent 07/30/2018   Leukopenia 05/03/2018   Thrombocytopenia ([place]) 05/03/2018   Pneumonia due to human metapneumovirus 05/02/2018   Chest pain 05/01/2018   Allergic rhinitis 04/13/2018   S/P laparoscopic cholecystectomy 04/09/2018   Chronic respiratory failure with hypoxia and hypercapnia ([place]) 03/07/2018  Polyp of cecum    Polyp of transverse colon    Positive colorectal cancer screening using Cologuard test    Dysphagia    Acute on chronic renal failure (HCC) 11/20/2017   CAD (coronary artery disease) 11/20/2017   DM2 (diabetes mellitus, type 2) ([place]) 11/20/2017   Abdominal pain, epigastric 11/20/2017   Vasomotor rhinitis 10/16/2016   Right facial numbness 03/05/2016   CKD (chronic kidney disease), stage III ([place]) 03/05/2016   Atrial  fibrillation ([place]) 10/24/2015   Chest pain with moderate risk of acute coronary syndrome 10/23/2015   History of TIA (transient ischemic attack) 10/22/2015   CHB (complete heart block) ([place]) 06/22/2015   Allergic drug rash due to anti-infective agent 04/29/2015   Fatigue 02/14/2015   Chronic diastolic CHF (congestive heart failure) ([place]) 02/14/2015   Cellulitis 12/21/2014   S/P CABG x 5 11/29/2014   CAD S/P percutaneous coronary angioplasty    Diarrhea 07/12/2014   Generalized abdominal pain 47/10/6281   Diastolic CHF, acute on chronic ([place]) 11/04/2013   Mixed hypercholesterolemia and hypertriglyceridemia 04/22/2013   Vertigo 04/22/2013   Dyspnea on exertion 08/25/2012   Allergic to IV contrast 07/23/2012   Unstable angina (HCC) 07/22/2012   Morbid (severe) obesity due to excess calories ([place]) 11/22/2011   Gastroesophageal reflux disease 11/22/2011   Back pain 11/22/2011   OSA (obstructive sleep apnea) 11/22/2011   HEMORRHOIDS-EXTERNAL 02/21/2010   NAUSEA 02/21/2010   ABDOMINAL PAIN -GENERALIZED 02/21/2010   PERSONAL HX COLONIC POLYPS 02/21/2010   ANEMIA 04/16/2007   Essential hypertension 04/16/2007   DIVERTICULOSIS, COLON 04/16/2007   ARTHRITIS 04/16/2007   INTERNAL HEMORRHOIDS WITHOUT MENTION COMP 04/12/2007   Asthma 04/12/2007   Cardiac resynchronization therapy pacemaker (CRT-P) in place 04/12/2007   Gastritis without bleeding 12/14/2006   COLONIC POLYPS, HYPERPLASTIC 09/27/2002   FATTY LIVER DISEASE 02/16/2002   PCP:  Lujean Amel, MD Pharmacy:   [facility], [place] [address] Phone: [phone] Fax: [phone]     Social Determinants of Health (SDOH) Interventions    Readmission Risk Interventions Readmission Risk Prevention Plan 01/10/2021  Transportation Screening Complete  PCP or Specialist Appt within 3-5 Days Complete  HRI or Home Care Consult Complete  Social Work Consult  for [place] Planning/Counseling Complete  Palliative Care Screening Not Applicable  Medication Review Press photographer) Complete  Some recent data might be hidden

## 2021-01-10 NOTE — Consult Note (Signed)
   [facility] Inpatient Consult   01/10/2021  HARRYETTE SHUART 18-May-1945 [id number]  [place] [facility] [ACO] Patient: [employer] Medicare  Primary Care Provider:  Lujean Amel, MD, [facility] [place] is listed for the transition of care follow up and appointments.  Patient was screened for high risk score for unplanned readmission risk and for assessing of complex care management needs.  Spoke with the patient via hospital phone, HIPAA verified, regarding post hospital care management for post hospitalized patients.  Patient agrees and states her son, Thayer Jew, is the contact as he lives with her.  Plan: A referral for post hospital sent for complex care management follow up.  Please contact for further questions,  Natividad Brood, RN BSN [facility] Liaison  [phone] business mobile phone Toll free office [phone]  Fax number: [phone] [email or web address]

## 2021-01-10 NOTE — Care Management Important Message (Signed)
Important Message  Patient Details  Name: [name] MRN: [id number] Date of Birth: 06/17/1945   Medicare Important Message Given:  Yes     Shelda Altes 01/10/2021, 8:29 AM

## 2021-01-10 NOTE — TOC Transition Note (Signed)
Transition of Care [facility]) - CM/SW Discharge Note   Patient Details  Name: [name] MRN: [id number] Date of Birth: March 28, 1945  Transition of Care [facility]) CM/SW Contact:  Zenon Mayo, RN Phone Number: 01/10/2021, 2:13 PM   Clinical Narrative:    NCM spoke with patient at bedside, offered choice , she states she wants who she had before, NCM looked it up and it was [place].  NCM made referral to Amy with [facility], she states she will send referral in , but the soc may be for Monday , patient states that is fine with her.  She has all the DME she needs at home and her son will have her grandson to stay with her for a couple days. Son will transport her home and will bring her oxygen tank to go home with. She has home oxygen with [employer] (3 liters).   Final next level of care: [place] Barriers to Discharge: No Barriers Identified   Patient Goals and CMS Choice Patient states their goals for this hospitalization and ongoing recovery are:: return with [facility] CMS Medicare.gov Compare Post Acute Care list provided to:: Patient Choice offered to / list presented to : Patient  Discharge Placement                       Discharge Plan and Services   Discharge Planning Services: CM Consult Post Acute Care Choice: Home Health            DME Agency: NA       HH Arranged: PT HH Agency: [place] Date [place]: 01/10/21 Time [place]: [id number] Representative spoke with at [place]: [place] ([place]) Interventions     Readmission Risk Interventions Readmission Risk Prevention Plan 01/10/2021  Transportation Screening Complete  PCP or Specialist Appt within 3-5 Days Complete  HRI or [place] Complete  Social Work Consult for [place] Planning/Counseling Complete  Palliative Care Screening Not Applicable  Medication Review Press photographer) Complete  Some recent data might be hidden

## 2021-01-14 ENCOUNTER — Telehealth: Payer: Self-pay | Admitting: Cardiovascular Disease

## 2021-01-14 DIAGNOSIS — M109 Gout, unspecified: Secondary | ICD-10-CM | POA: Diagnosis not present

## 2021-01-14 DIAGNOSIS — E785 Hyperlipidemia, unspecified: Secondary | ICD-10-CM | POA: Diagnosis not present

## 2021-01-14 DIAGNOSIS — I5031 Acute diastolic (congestive) heart failure: Secondary | ICD-10-CM | POA: Diagnosis not present

## 2021-01-14 DIAGNOSIS — Z9981 Dependence on supplemental oxygen: Secondary | ICD-10-CM | POA: Diagnosis not present

## 2021-01-14 DIAGNOSIS — G4733 Obstructive sleep apnea (adult) (pediatric): Secondary | ICD-10-CM | POA: Diagnosis not present

## 2021-01-14 DIAGNOSIS — Z7902 Long term (current) use of antithrombotics/antiplatelets: Secondary | ICD-10-CM | POA: Diagnosis not present

## 2021-01-14 DIAGNOSIS — E1122 Type 2 diabetes mellitus with diabetic chronic kidney disease: Secondary | ICD-10-CM | POA: Diagnosis not present

## 2021-01-14 DIAGNOSIS — I10 Essential (primary) hypertension: Secondary | ICD-10-CM | POA: Diagnosis not present

## 2021-01-14 DIAGNOSIS — D649 Anemia, unspecified: Secondary | ICD-10-CM | POA: Diagnosis not present

## 2021-01-14 DIAGNOSIS — N183 Chronic kidney disease, stage 3 unspecified: Secondary | ICD-10-CM | POA: Diagnosis not present

## 2021-01-14 DIAGNOSIS — K5900 Constipation, unspecified: Secondary | ICD-10-CM | POA: Diagnosis not present

## 2021-01-14 DIAGNOSIS — I5032 Chronic diastolic (congestive) heart failure: Secondary | ICD-10-CM | POA: Diagnosis not present

## 2021-01-14 NOTE — Telephone Encounter (Signed)
Pt c/o swelling: STAT is pt has developed SOB within 24 hours  If swelling, where is the swelling located? Nowhere specific  How much weight have you gained and in what time span? 4 lbs overnight    Have you gained 3 pounds in a day or 5 pounds in a week?   Do you have a log of your daily weights (if so, list)?   Are you currently taking a fluid pill? yes  Are you currently SOB? no  Have you traveled recently?

## 2021-01-14 NOTE — Telephone Encounter (Signed)
[name], [name], MD  You 28 minutes ago (9:48 AM)   Please take torsemide 60 mg on days when weight is 228 lb or higher, otherwise take torsemide 40 mg daily. Stop the Gatorade - too much sodium.   Spoke with patient and relayed advice from MD. Explained med change/parameters. Advised avoid gatorade. No new Rx is needed at this time, per patient. Updated med instructions in chart.

## 2021-01-14 NOTE — Telephone Encounter (Signed)
Returned call to patient of Dr. Loletha Grayer -- she reports weight gain of 4lbs overnight (see trends below). She was recently in hospital (discharged 12/1) for heart failure. She gets SOB with minimal exertion (such as walking from room to room) -- this is audible on the phone. She also reports dizziness with movement. She reports a little bit of swelling in legs but is on antibiotic for cellulitis. She reports her eye is bloodshot and she has a headache. She has a visit with PCP on 12/14  BP and Weights since home from hospital 12/2 - 133/80 HR 73 - weight 227 12/3 - 93/53 HR 71 - weight 227 12/4 - 119/70 HR 72 - weight 228 12/5 - 129/81 HR 67 - weight 230  Patient is taking torsemide 40mg  daily   She monitors her diet - avoids salt - bakes meat - veggies/fruit  She drinks water, diet gatorade (which has about 270mg  sodium)  Advised will send message to Dr. Loletha Grayer to review weight gain/concerns

## 2021-01-15 ENCOUNTER — Other Ambulatory Visit: Payer: Self-pay | Admitting: *Deleted

## 2021-01-15 DIAGNOSIS — M1000 Idiopathic gout, unspecified site: Secondary | ICD-10-CM | POA: Insufficient documentation

## 2021-01-15 DIAGNOSIS — Z91013 Allergy to seafood: Secondary | ICD-10-CM | POA: Insufficient documentation

## 2021-01-15 DIAGNOSIS — J4520 Mild intermittent asthma, uncomplicated: Secondary | ICD-10-CM | POA: Insufficient documentation

## 2021-01-15 DIAGNOSIS — E1121 Type 2 diabetes mellitus with diabetic nephropathy: Secondary | ICD-10-CM | POA: Insufficient documentation

## 2021-01-15 DIAGNOSIS — H04123 Dry eye syndrome of bilateral lacrimal glands: Secondary | ICD-10-CM | POA: Diagnosis not present

## 2021-01-15 DIAGNOSIS — M109 Gout, unspecified: Secondary | ICD-10-CM | POA: Insufficient documentation

## 2021-01-15 DIAGNOSIS — H5713 Ocular pain, bilateral: Secondary | ICD-10-CM | POA: Diagnosis not present

## 2021-01-15 DIAGNOSIS — H1132 Conjunctival hemorrhage, left eye: Secondary | ICD-10-CM | POA: Diagnosis not present

## 2021-01-15 DIAGNOSIS — K8020 Calculus of gallbladder without cholecystitis without obstruction: Secondary | ICD-10-CM | POA: Insufficient documentation

## 2021-01-15 DIAGNOSIS — E7800 Pure hypercholesterolemia, unspecified: Secondary | ICD-10-CM | POA: Insufficient documentation

## 2021-01-15 DIAGNOSIS — E2839 Other primary ovarian failure: Secondary | ICD-10-CM | POA: Insufficient documentation

## 2021-01-15 NOTE — Patient Outreach (Signed)
[place] [facility]) Care Management Telephonic RN Care Manager Note   01/16/2021 Name:  [name] MRN:  [id number] DOB:  21-Mar-1945  Summary: Pt reports concern with bleeding "from my eye" per her ophthalmologist during her visit today 01/15/21  Scheduled appointment to be seen by her pcp on 01/23/21  No frank bleeding presently noted of her nose, stools, in sputum or urine confirmed Coughing up white mucus -using mucinex  She informs RN CM she feels this is related to her sinus symptoms Pt receiving an incoming call from the MD office during this outreach  [facility] RN CM call discontinued to allow pt tp receive a call from her MD office staff   Recommendations/Changes made from today's visit: Continue her anticoagulant but follow up with her pcp and or cardiologist to determine if more frequent lab monitoring and adjustment of medication is needed She voiced understanding   Subjective: [name] is an 75 y.o. year old female who is a primary patient of [name], [name], MD. The care management team was consulted for assistance with care management and/or care coordination needs.    Telephonic RN Care Manager completed Telephone Visit today.   Objective:  Medications Reviewed Today     Reviewed by Lannette Donath, CPhT (Pharmacy Technician) on 01/07/21 at [phone]  Med List Status: Complete   Medication Order Taking? Sig Documenting Provider Last Dose Status Informant  ACCU-CHEK AVIVA PLUS test strip [id number] Yes Check blood sugar twice daily [provider]  Active Self  Accu-Chek Softclix Lancets lancets [id number] Yes Check blood sugar twice daily [provider]  Active Self  acetaminophen (TYLENOL) 500 MG tablet [id number] Yes Take 1,000 mg by mouth 2 (two) times daily as needed (for pain or headaches). [provider] Past Month Active Self  albuterol (PROVENTIL HFA;VENTOLIN HFA) 108 (90 Base) MCG/ACT inhaler [id number] Yes Inhale 2 puffs  into the lungs every 6 (six) hours as needed for wheezing or shortness of breath. Norval Morton, MD Past Month Active Self  albuterol (PROVENTIL) (2.5 MG/3ML) 0.083% nebulizer solution [id number] Yes Take 3 mLs (2.5 mg total) by nebulization every 6 (six) hours as needed for wheezing or shortness of breath. Norval Morton, MD unk Active Self  Alcohol Swabs (B-D SINGLE USE SWABS REGULAR) PADS [id number] Yes  [provider]  Active Self  allopurinol (ZYLOPRIM) 100 MG tablet [id number] Yes TAKE 1 TABLET BY MOUTH  DAILY  Patient taking differently: Take 100 mg by mouth daily.   Croitoru, [name], MD 01/06/2021 Active Self  aspirin EC 81 MG tablet [id number] Yes Take 1 tablet (81 mg total) by mouth daily. Erlene Quan, Hershal Coria 01/06/2021 Active Self  budesonide-formoterol (SYMBICORT) 160-4.5 MCG/ACT inhaler [id number] Yes Inhale 2 puffs into the lungs 2 (two) times daily. [provider] 01/06/2021 Active Self  carboxymethylcellulose (REFRESH PLUS) 0.5 % SOLN [id number] Yes Place 1 drop into both eyes every 12 (twelve) hours. [provider] 01/06/2021 Active Self  clopidogrel (PLAVIX) 75 MG tablet [id number] Yes Take 1 tablet (75 mg total) by mouth daily.  Patient taking differently: Take 75 mg by mouth at bedtime.   Croitoru, [name], MD 01/06/2021 Active Self  cyclobenzaprine (FLEXERIL) 10 MG tablet [id number] Yes Take 5 mg by mouth 2 (two) times daily as needed for muscle spasms. [provider] Past Week Active Self  Docusate Calcium (STOOL SOFTENER PO) [id number] Yes Take 1 tablet by mouth at bedtime. [provider] 01/06/2021 Active Self  empagliflozin (JARDIANCE) 10 MG  TABS tablet [id number] Yes Take 10 mg by mouth daily before breakfast. Bensimhon, Shaune Pascal, MD 01/06/2021 Active Self           Med Note [name] Dec 27, 2020 11:19 AM)    EPINEPHrine 0.3 mg/0.3 mL IJ SOAJ injection [id number] Yes Inject 0.3 mg into the muscle once as needed for  anaphylaxis (severe allergic reaction). [provider] unk Active Self  ferrous sulfate 325 (65 FE) MG tablet [id number] Yes Take 325 mg by mouth daily with breakfast. [provider] 01/06/2021 Active Self  fluticasone (FLONASE) 50 MCG/ACT nasal spray [id number] Yes Place 1 spray into both nostrils daily. [provider] 01/06/2021 Active Self  ipratropium (ATROVENT) 0.02 % nebulizer solution [id number] Yes Take 2.5 mLs (0.5 mg total) by nebulization 4 (four) times daily.  Patient taking differently: Take 0.5 mg by nebulization every 4 (four) hours as needed (Asthma).   Norval Morton, MD unk Active Self  isosorbide mononitrate (IMDUR) 30 MG 24 hr tablet [id number] Yes Take 90 mg by mouth daily. [provider] 01/06/2021 Active Self           Med Note [place] Crane, MISTY D   Mon Jan 07, 2021  6:03 AM)    loratadine (CLARITIN) 10 MG tablet [id number] Yes Take 10 mg by mouth daily. [provider] 01/06/2021 Active Self  nitroGLYCERIN (NITROSTAT) 0.4 MG SL tablet [id number] Yes Place 1 tablet (0.4 mg total) under the tongue every 5 (five) minutes as needed for chest pain. Erlene Quan, PA-C unk Active Self           Med Note [name], [name]   Thu Jan 27, 2019 11:45 AM)    nystatin cream (MYCOSTATIN) [id number] Yes Apply 1 application topically 2 (two) times daily as needed (rash). [provider] unk Active Self  OXYGEN [id number] Yes Inhale 3 L/min into the lungs continuous.  [provider]  Active Self  pantoprazole (PROTONIX) 40 MG tablet [id number] Yes Take 1 tablet (40 mg total) by mouth 2 (two) times daily. [name], MD 01/06/2021 Active Self  polyethylene glycol (MIRALAX / GLYCOLAX) 17 g packet [id number] Yes Take 17 g by mouth daily. [provider] 01/06/2021 Active Self  potassium chloride SA (KLOR-CON) 20 MEQ tablet [id number] Yes Take 20 mEq by mouth every morning. [provider] 01/06/2021 Active  Self  ranolazine (RANEXA) 1000 MG SR tablet [id number] Yes TAKE 1 TABLET BY MOUTH  TWICE DAILY  Patient taking differently: 1,000 mg 2 (two) times daily.   Croitoru, [name], MD 01/06/2021 Active Self  rosuvastatin (CRESTOR) 40 MG tablet [id number] Yes Take 1 tablet (40 mg total) by mouth every evening. NEED OV. Flora Lipps, MD 01/06/2021 Active Self  sucralfate (CARAFATE) 1 g tablet [id number] Yes Take 1 tablet (1 g total) by mouth in the morning, at noon, and at bedtime. As needed  Patient taking differently: Take 1 g by mouth at bedtime.   [name], MD 01/06/2021 Active Self  torsemide (DEMADEX) 20 MG tablet [id number] Yes Take 1 tablet (20 mg total) by mouth daily.  Patient taking differently: Take 20 mg by mouth 2 (two) times daily.   Sanda Klein, MD 01/06/2021 Active Self             SDOH:  (Social Determinants of Health) assessments and interventions performed:  SDOH Interventions    Flowsheet Row Most Recent Value  SDOH Interventions   Food Insecurity Interventions Intervention Not Indicated  Housing Interventions Intervention Not Indicated  Transportation Interventions Intervention Not Indicated       Care Plan  Review of patient past medical history, allergies, medications, health status, including review of consultants reports, laboratory and other test data, was performed as part of comprehensive evaluation for care management services.   There are no care plans that you recently modified to display for this patient.    Plan: The care management team will reach out to the patient again over the next 7-10 business days.  Paquita Printy L. Lavina Hamman, RN, BSN, [place] Coordinator Office number [phone] [facility] number [phone] Fax number [phone]

## 2021-01-16 ENCOUNTER — Encounter (HOSPITAL_COMMUNITY): Payer: Medicare Other

## 2021-01-16 NOTE — Telephone Encounter (Signed)
Spoke with patient of Dr. Loletha Grayer - she took torsemide 60mg  x1 day since 12/5 and plans to take additional dose this afternoon b/c her weight today is 231lbs  BP this morning was 88/49 and 102/55 She reports having a headache   She checked BP while on the phone and it was 117/69  Advised she check BP at least twice daily and call back if more SBP under 100  Will send update to Dr. Loletha Grayer as she has had to take extra torsemide

## 2021-01-16 NOTE — Telephone Encounter (Signed)
Pt c/o BP issue: STAT if pt c/o blurred vision, one-sided weakness or slurred speech  1. What are your last 5 BP readings?  12/05: 129/81 12/06: 122/62 12/07: 88/49   102/55  2. Are you having any other symptoms (ex. Dizziness, headache, blurred vision, passed out)?  Lightheadedness, headache  3. What is your BP issue?   Patient is following up. She states her BP is still extremely low.

## 2021-01-17 DIAGNOSIS — D649 Anemia, unspecified: Secondary | ICD-10-CM | POA: Diagnosis not present

## 2021-01-17 DIAGNOSIS — I5032 Chronic diastolic (congestive) heart failure: Secondary | ICD-10-CM | POA: Diagnosis not present

## 2021-01-17 DIAGNOSIS — G4733 Obstructive sleep apnea (adult) (pediatric): Secondary | ICD-10-CM | POA: Diagnosis not present

## 2021-01-17 DIAGNOSIS — E1122 Type 2 diabetes mellitus with diabetic chronic kidney disease: Secondary | ICD-10-CM | POA: Diagnosis not present

## 2021-01-17 DIAGNOSIS — K5900 Constipation, unspecified: Secondary | ICD-10-CM | POA: Diagnosis not present

## 2021-01-17 DIAGNOSIS — I5031 Acute diastolic (congestive) heart failure: Secondary | ICD-10-CM | POA: Diagnosis not present

## 2021-01-17 DIAGNOSIS — M109 Gout, unspecified: Secondary | ICD-10-CM | POA: Diagnosis not present

## 2021-01-17 DIAGNOSIS — I10 Essential (primary) hypertension: Secondary | ICD-10-CM | POA: Diagnosis not present

## 2021-01-17 DIAGNOSIS — N183 Chronic kidney disease, stage 3 unspecified: Secondary | ICD-10-CM | POA: Diagnosis not present

## 2021-01-17 DIAGNOSIS — Z7902 Long term (current) use of antithrombotics/antiplatelets: Secondary | ICD-10-CM | POA: Diagnosis not present

## 2021-01-18 ENCOUNTER — Encounter: Payer: Self-pay | Admitting: Nurse Practitioner

## 2021-01-18 ENCOUNTER — Ambulatory Visit (INDEPENDENT_AMBULATORY_CARE_PROVIDER_SITE_OTHER): Payer: Medicare Other | Admitting: Nurse Practitioner

## 2021-01-18 VITALS — BP 120/72 | HR 74 | Ht 63.0 in | Wt 229.0 lb

## 2021-01-18 DIAGNOSIS — R1013 Epigastric pain: Secondary | ICD-10-CM

## 2021-01-18 DIAGNOSIS — Z86010 Personal history of colonic polyps: Secondary | ICD-10-CM | POA: Diagnosis not present

## 2021-01-18 DIAGNOSIS — K5900 Constipation, unspecified: Secondary | ICD-10-CM

## 2021-01-18 NOTE — Patient Instructions (Signed)
Can decrease Carafate slowly to twice daily for a few days, then once daily for a few days. If no increase in pain then may stop. Resume to three times daily if recurrent abdominal pain.  If you are age 76 or older, your body mass index should be between 23-30. Your Body mass index is 40.57 kg/m. If this is out of the aforementioned range listed, please consider follow up with your Primary Care Provider.  If you are age 57 or younger, your body mass index should be between 19-25. Your Body mass index is 40.57 kg/m. If this is out of the aformentioned range listed, please consider follow up with your Primary Care Provider.   ________________________________________________________  The [facility] providers would like to encourage you to use [facility] to communicate with providers for non-urgent requests or questions.  Due to long hold times on the telephone, sending your provider a message by [facility] may be a faster and more efficient way to get a response.  Please allow 48 business hours for a response.  Please remember that this is for non-urgent requests.  _______________________________________________________

## 2021-01-18 NOTE — Progress Notes (Signed)
ASSESSMENT AND PLAN    # 75 yo female with multiple co-morbidities here for follow up on abdominal pain and constipation --Still with intermittent epigastric.  Pain previously felt to be muscular in nature related to body habitus / being in same position for prolonged periods. She doesn't know if Carafate has helped but lidoderm patches have. Continue lidoderm patches --We discussed proceeding with CT scan to further evaluate the epigastric pain but she opts to hold off for now.  --She can start to decrease carafate and wean off since she didn't find it beneficial and it can exacerbate constipation. If abdominal pain gets worse then resume Carafate.TID.   #Chronic constipation --Continue daily Miralax for constipation  # History of adenomatous colon polyp. She was on colonoscopy recall list for November 2022.  Procedure was not done probably as patient was hospitalized with CHF at the time. --Given recent hospital admission for CHF I did not schedule her colonoscopy, will defer to Dr. Silverio Decamp about timing    HISTORY OF PRESENT ILLNESS    Chief Complaint : told by hospital to follow up with GI   [name] is a 75 y.o. female known to Dr. Silverio Decamp. She has an extensive past medical history including hypertension, obesity, diabetes, hyperlipidemia, OSA, CKD stage III, CAD status post CABG 2016,  CVA secondary to high-grade ICA stenosis status post stent placement August 2022,  CHF on home oxygen, chronic antiplatelet therapy with Plavix and aspirin . She is s/p biventricular AICD, diverticulosis, colon polyps.  Additional medical history as listed in [place] .   Patient most recently followed by Dr. Silverio Decamp for dysphagia, upper abdominal pain and constipation.   Seen November 2021 with dysphagia and abnormal barium swallow showing a smooth narrowing of the distal esophagus preventing passage of barium tablet.  She was scheduled for an EGD to be done at [facility] but procedure  canceled ( ? Reason).  She was seen again October 2022 with dysphagia .  Barium swallow and MBSS without evidence of high-grade stricture or masses .  EGD was not pursued as patient was managing her symptoms with dietary modifications.  She was asked to stay on Protonix  and for epigastric pain was prescribed Carafate which she is taking in Apple sauce TID.    Patient called office mid November with complaints of  abdominal pain. Pain was felt to be due to body habitus and muscle strain from sitting constantly in the same position. She was prescribed Lidoderm patches which have helped. Plan was for CT scan if pain persisted.   INTERVAL HISTORY:  Since her last visit here patient was hospitalized 12/1 with heart failure. Troponin negative. Pulmonary vascular congestion on CXR.  She was diuresed, Bisoprolol was increased.    [name] still has intermittent epigastric pain  and occasional nausea,  neither necessarily related to eating. She takes BID PPI and Carafate 3 times a day. She isn't sure that Carafate is helpful. Zofran helps the nausea. She is not having any dysphagia. She takes small bites and drinks fluids with meals  She has chronic constipation, managing well with daily Miralax.   Current Medications, Allergies, Past Medical History, Past Surgical History, Family History and Social History were reviewed in [employer] record.   Two 11 to 14 mm polyps in the transverse colon and in the cecum, removed with a hot snare. Resected and retrieved. - Two 1 to 2 mm polyps in the transverse colon and in the cecum,  removed with a cold biopsy forceps. Resected and retrieved. - Moderate diverticulosis in the sigmoid colon, in the descending colon, in the transverse colon and in the ascending colon. - Non-bleeding external and internal hemorrhoids.  Diagnosis 1. Stomach, biopsy, antral and body - GASTRIC ANTRAL MUCOSA SHOWING MARKED NON-SPECIFIC REACTIVE GASTROPATHY WITH  ULCERATION. - GASTRIC OXYNTIC MUCOSA WITH PARIETAL CELL HYPERPLASIA AS CAN BE SEEN IN HYPERGASTRINEMIC STATES SUCH AS PPI THERAPY. - WARTHIN-STARRY STAIN IS NEGATIVE FOR HELICOBACTER PYLORI. 2. Colon, polyp(s), cecum and transverse - TUBULAR ADENOMA(S). - NEGATIVE FOR HIGH GRADE DYSPLASIA OR MALIGNANCY. Corliss Blacker    Current Outpatient Medications  Medication Sig Dispense Refill   ACCU-CHEK AVIVA PLUS test strip Check blood sugar twice daily     Accu-Chek Softclix Lancets lancets Check blood sugar twice daily     acetaminophen (TYLENOL) 500 MG tablet Take 1,000 mg by mouth 2 (two) times daily as needed (for pain or headaches).     albuterol (PROVENTIL HFA;VENTOLIN HFA) 108 (90 Base) MCG/ACT inhaler Inhale 2 puffs into the lungs every 6 (six) hours as needed for wheezing or shortness of breath. 1 Inhaler 0   albuterol (PROVENTIL) (2.5 MG/3ML) 0.083% nebulizer solution Take 3 mLs (2.5 mg total) by nebulization every 6 (six) hours as needed for wheezing or shortness of breath. 75 mL 0   Alcohol Swabs (B-D SINGLE USE SWABS REGULAR) PADS      allopurinol (ZYLOPRIM) 100 MG tablet TAKE 1 TABLET BY MOUTH  DAILY (Patient taking differently: Take 100 mg by mouth daily.) 90 tablet 3   aspirin EC 81 MG tablet Take 1 tablet (81 mg total) by mouth daily. 90 tablet 3   bisoprolol (ZEBETA) 10 MG tablet Take 1 tablet (10 mg total) by mouth daily. 30 tablet 6   budesonide-formoterol (SYMBICORT) 160-4.5 MCG/ACT inhaler Inhale 2 puffs into the lungs 2 (two) times daily.     carboxymethylcellulose (REFRESH PLUS) 0.5 % SOLN Place 1 drop into both eyes every 12 (twelve) hours.     clopidogrel (PLAVIX) 75 MG tablet Take 1 tablet (75 mg total) by mouth daily. (Patient taking differently: Take 75 mg by mouth at bedtime.) 90 tablet 1   cyclobenzaprine (FLEXERIL) 10 MG tablet Take 5 mg by mouth 2 (two) times daily as needed for muscle spasms.     Docusate Calcium (STOOL SOFTENER PO) Take 1 tablet by mouth at bedtime.      empagliflozin (JARDIANCE) 10 MG TABS tablet Take 10 mg by mouth daily before breakfast. 90 tablet 3   EPINEPHrine 0.3 mg/0.3 mL IJ SOAJ injection Inject 0.3 mg into the muscle once as needed for anaphylaxis (severe allergic reaction).     ferrous sulfate 325 (65 FE) MG tablet Take 325 mg by mouth daily with breakfast.     fluticasone (FLONASE) 50 MCG/ACT nasal spray Place 1 spray into both nostrils daily.     guaiFENesin (MUCINEX) 600 MG 12 hr tablet Take 1 tablet (600 mg total) by mouth 2 (two) times daily. 30 tablet 1   ipratropium (ATROVENT) 0.02 % nebulizer solution Take 2.5 mLs (0.5 mg total) by nebulization 4 (four) times daily. (Patient taking differently: Take 0.5 mg by nebulization every 4 (four) hours as needed (Asthma).) 75 mL 0   isosorbide mononitrate (IMDUR) 30 MG 24 hr tablet Take 90 mg by mouth daily.     loratadine (CLARITIN) 10 MG tablet Take 10 mg by mouth daily.     nystatin cream (MYCOSTATIN) Apply 1 application topically 2 (two) times daily as  needed (rash).     OXYGEN Inhale 3 L/min into the lungs continuous.      pantoprazole (PROTONIX) 40 MG tablet Take 1 tablet (40 mg total) by mouth 2 (two) times daily. 180 tablet 3   polyethylene glycol (MIRALAX / GLYCOLAX) 17 g packet Take 17 g by mouth daily.     potassium chloride SA (KLOR-CON) 20 MEQ tablet Take 20 mEq by mouth every morning.     ranolazine (RANEXA) 1000 MG SR tablet TAKE 1 TABLET BY MOUTH  TWICE DAILY (Patient taking differently: 1,000 mg 2 (two) times daily.) 180 tablet 1   rosuvastatin (CRESTOR) 40 MG tablet Take 1 tablet (40 mg total) by mouth every evening. NEED OV. 90 tablet 2   sucralfate (CARAFATE) 1 g tablet Take 1 tablet (1 g total) by mouth in the morning, at noon, and at bedtime. As needed (Patient taking differently: Take 1 g by mouth at bedtime.) 90 tablet 3   Torsemide 40 MG TABS Take by mouth. Take 40mg  by mouth daily. If weight is over 228lbs, take extra 20mg  (for total of 60mg )     nitroGLYCERIN  (NITROSTAT) 0.4 MG SL tablet Place 1 tablet (0.4 mg total) under the tongue every 5 (five) minutes as needed for chest pain. 25 tablet 3   No current facility-administered medications for this visit.    Review of Systems: No chest pain. Chronic SOB. No urinary complaints.   PHYSICAL EXAM :    Wt Readings from Last 3 Encounters:  01/18/21 229 lb (103.9 kg)  01/10/21 226 lb 6.4 oz (102.7 kg)  12/31/20 227 lb (103 kg)    BP 120/72   Pulse 74   Ht 5\' 3"  (1.6 m)   Wt 229 lb (103.9 kg)   BMI 40.57 kg/m  Constitutional:  female in wheelchair with portable 02. In no acute distress. Psychiatric: Pleasant. Normal mood and affect. Behavior is normal. EENT: Pupils normal.  Conjunctivae are normal. No scleral icterus. Neck supple.  Cardiovascular: Normal rate, regular rhythm, 2+ BLE edema.  Pulmonary/chest: Effort normal and breath sounds normal. No wheezing, rales or rhonchi. Abdominal: Limited exam in wheelchair. Abdomen protuberant,  nontender. Bowel sounds active throughout. There are no masses palpable. No hepatomegaly. Neurological: Alert and oriented to person place and time. Skin: Skin is warm and dry. No rashes noted.  I spent 35 minutes total reviewing records, obtaining history, performing exam, counseling patient and documenting visit / findings.

## 2021-01-21 ENCOUNTER — Ambulatory Visit (HOSPITAL_COMMUNITY)
Admission: RE | Admit: 2021-01-21 | Discharge: 2021-01-21 | Disposition: A | Discharge status: Home / Self Care | Payer: Medicare Other | Source: Ambulatory Visit | Attending: Physician Assistant | Admitting: Physician Assistant

## 2021-01-21 ENCOUNTER — Other Ambulatory Visit: Payer: Self-pay

## 2021-01-21 ENCOUNTER — Other Ambulatory Visit: Payer: Self-pay | Admitting: *Deleted

## 2021-01-21 ENCOUNTER — Encounter (HOSPITAL_COMMUNITY): Payer: Self-pay

## 2021-01-21 ENCOUNTER — Telehealth (HOSPITAL_COMMUNITY): Payer: Self-pay

## 2021-01-21 VITALS — BP 130/82 | HR 64

## 2021-01-21 DIAGNOSIS — J449 Chronic obstructive pulmonary disease, unspecified: Secondary | ICD-10-CM | POA: Insufficient documentation

## 2021-01-21 DIAGNOSIS — J9610 Chronic respiratory failure, unspecified whether with hypoxia or hypercapnia: Secondary | ICD-10-CM | POA: Diagnosis not present

## 2021-01-21 DIAGNOSIS — E7800 Pure hypercholesterolemia, unspecified: Secondary | ICD-10-CM | POA: Diagnosis not present

## 2021-01-21 DIAGNOSIS — Z7984 Long term (current) use of oral hypoglycemic drugs: Secondary | ICD-10-CM | POA: Diagnosis not present

## 2021-01-21 DIAGNOSIS — G4733 Obstructive sleep apnea (adult) (pediatric): Secondary | ICD-10-CM | POA: Insufficient documentation

## 2021-01-21 DIAGNOSIS — I5042 Chronic combined systolic (congestive) and diastolic (congestive) heart failure: Secondary | ICD-10-CM | POA: Diagnosis not present

## 2021-01-21 DIAGNOSIS — N183 Chronic kidney disease, stage 3 unspecified: Secondary | ICD-10-CM | POA: Diagnosis not present

## 2021-01-21 DIAGNOSIS — E1122 Type 2 diabetes mellitus with diabetic chronic kidney disease: Secondary | ICD-10-CM | POA: Insufficient documentation

## 2021-01-21 DIAGNOSIS — I5032 Chronic diastolic (congestive) heart failure: Secondary | ICD-10-CM | POA: Diagnosis not present

## 2021-01-21 DIAGNOSIS — I2510 Atherosclerotic heart disease of native coronary artery without angina pectoris: Secondary | ICD-10-CM | POA: Diagnosis not present

## 2021-01-21 DIAGNOSIS — Z6841 Body Mass Index (BMI) 40.0 and over, adult: Secondary | ICD-10-CM | POA: Diagnosis not present

## 2021-01-21 DIAGNOSIS — Z951 Presence of aortocoronary bypass graft: Secondary | ICD-10-CM | POA: Diagnosis not present

## 2021-01-21 DIAGNOSIS — N184 Chronic kidney disease, stage 4 (severe): Secondary | ICD-10-CM | POA: Diagnosis not present

## 2021-01-21 DIAGNOSIS — Z7951 Long term (current) use of inhaled steroids: Secondary | ICD-10-CM | POA: Diagnosis not present

## 2021-01-21 DIAGNOSIS — I509 Heart failure, unspecified: Secondary | ICD-10-CM | POA: Diagnosis not present

## 2021-01-21 DIAGNOSIS — J9611 Chronic respiratory failure with hypoxia: Secondary | ICD-10-CM | POA: Diagnosis not present

## 2021-01-21 DIAGNOSIS — Z955 Presence of coronary angioplasty implant and graft: Secondary | ICD-10-CM | POA: Diagnosis not present

## 2021-01-21 DIAGNOSIS — Z95810 Presence of automatic (implantable) cardiac defibrillator: Secondary | ICD-10-CM | POA: Diagnosis not present

## 2021-01-21 DIAGNOSIS — Z7982 Long term (current) use of aspirin: Secondary | ICD-10-CM | POA: Diagnosis not present

## 2021-01-21 DIAGNOSIS — R0609 Other forms of dyspnea: Secondary | ICD-10-CM

## 2021-01-21 DIAGNOSIS — Z79899 Other long term (current) drug therapy: Secondary | ICD-10-CM | POA: Insufficient documentation

## 2021-01-21 DIAGNOSIS — I130 Hypertensive heart and chronic kidney disease with heart failure and stage 1 through stage 4 chronic kidney disease, or unspecified chronic kidney disease: Secondary | ICD-10-CM | POA: Diagnosis not present

## 2021-01-21 DIAGNOSIS — J9612 Chronic respiratory failure with hypercapnia: Secondary | ICD-10-CM

## 2021-01-21 DIAGNOSIS — I4891 Unspecified atrial fibrillation: Secondary | ICD-10-CM | POA: Diagnosis not present

## 2021-01-21 LAB — BASIC METABOLIC PANEL
Anion gap: 9 (ref 5–15)
BUN: 30 mg/dL — ABNORMAL HIGH (ref 8–23)
CO2: 36 mmol/L — ABNORMAL HIGH (ref 22–32)
Calcium: 8.9 mg/dL (ref 8.9–10.3)
Chloride: 94 mmol/L — ABNORMAL LOW (ref 98–111)
Creatinine, Ser: 1.84 mg/dL — ABNORMAL HIGH (ref 0.44–1.00)
GFR, Estimated: 28 mL/min — ABNORMAL LOW (ref >60)
Glucose, Bld: 147 mg/dL — ABNORMAL HIGH (ref 70–99)
Potassium: 3.5 mmol/L (ref 3.5–5.1)
Sodium: 139 mmol/L (ref 135–145)

## 2021-01-21 MED ORDER — TORSEMIDE 40 MG PO TABS: 60.0000 mg | ORAL_TABLET | Freq: Every day | ORAL | 6 refills | Status: DC

## 2021-01-21 NOTE — Patient Outreach (Signed)
[facility]) Care Management Telephonic RN Care Manager Note   01/21/2021 Name:  [name] MRN:  [id number] DOB:  05-29-1945  Summary: Follow up outreach to patient.  She reports doing fair She has swelling that continues but manageable  She has fallen x 2 Eye better   Recommendations/Changes made from today's visit: Assessed for worsening s/s  Education on edema reduction and falls    Subjective: [name] is an 75 y.o. year old female who is a primary patient of [name], [name], MD. The care management team was consulted for assistance with care management and/or care coordination needs.    Telephonic RN Care Manager completed Telephone Visit today.   Objective:  Medications Reviewed Today     Reviewed by Kerry Dory, CMA (Certified Medical Assistant) on 01/21/21 at 1124  Med List Status: <None>   Medication Order Taking? Sig Documenting Provider Last Dose Status Informant  ACCU-CHEK AVIVA PLUS test strip [id number] Yes Check blood sugar twice daily [provider] Taking Active Self  Accu-Chek Softclix Lancets lancets [id number] Yes Check blood sugar twice daily [provider] Taking Active Self  acetaminophen (TYLENOL) 500 MG tablet [id number] Yes Take 1,000 mg by mouth 2 (two) times daily as needed (for pain or headaches). [provider] Taking Active Self  albuterol (PROVENTIL HFA;VENTOLIN HFA) 108 (90 Base) MCG/ACT inhaler [id number] Yes Inhale 2 puffs into the lungs every 6 (six) hours as needed for wheezing or shortness of breath. Norval Morton, MD Taking Active Self  albuterol (PROVENTIL) (2.5 MG/3ML) 0.083% nebulizer solution [id number] Yes Take 3 mLs (2.5 mg total) by nebulization every 6 (six) hours as needed for wheezing or shortness of breath. Norval Morton, MD Taking Active Self  Alcohol Swabs (B-D SINGLE USE SWABS REGULAR) PADS [id number] Yes  [provider] Taking Active Self   allopurinol (ZYLOPRIM) 100 MG tablet [id number] Yes TAKE 1 TABLET BY MOUTH  DAILY  Patient taking differently: Take 100 mg by mouth daily.   Croitoru, [name], MD Taking Active Self  aspirin EC 81 MG tablet [id number] Yes Take 1 tablet (81 mg total) by mouth daily. Erlene Quan, PA-C Taking Active Self  bisoprolol (ZEBETA) 10 MG tablet [id number] Yes Take 1 tablet (10 mg total) by mouth daily. [name], NP Taking Active   budesonide-formoterol [facility]) 160-4.5 MCG/ACT inhaler [id number] Yes Inhale 2 puffs into the lungs 2 (two) times daily. [provider] Taking Active Self  carboxymethylcellulose (REFRESH PLUS) 0.5 % SOLN [id number]  Place 1 drop into both eyes every 12 (twelve) hours. [provider]  Active Self  clopidogrel (PLAVIX) 75 MG tablet [id number] Yes Take 1 tablet (75 mg total) by mouth daily.  Patient taking differently: Take 75 mg by mouth at bedtime.   Croitoru, [name], MD Taking Active Self  cyclobenzaprine (FLEXERIL) 10 MG tablet [id number] Yes Take 5 mg by mouth 2 (two) times daily as needed for muscle spasms. [provider] Taking Active Self  Docusate Calcium (STOOL SOFTENER PO) [id number] Yes Take 1 tablet by mouth at bedtime. [provider] Taking Active Self  empagliflozin (JARDIANCE) 10 MG TABS tablet [id number] Yes Take 10 mg by mouth daily before breakfast. Bensimhon, Shaune Pascal, MD Taking Active Self           Med Note [name] Dec 27, 2020 11:19 AM)    EPINEPHrine 0.3 mg/0.3 mL IJ SOAJ injection [id number] Yes Inject 0.3 mg into the muscle once as  needed for anaphylaxis (severe allergic reaction). [provider] Taking Active Self  ferrous sulfate 325 (65 FE) MG tablet [id number] Yes Take 325 mg by mouth daily with breakfast. [provider] Taking Active Self  fluticasone (FLONASE) 50 MCG/ACT nasal spray [id number] Yes Place 1 spray into both nostrils daily. [provider] Taking Active  Self  guaiFENesin (MUCINEX) 600 MG 12 hr tablet [id number] Yes Take 1 tablet (600 mg total) by mouth 2 (two) times daily. [name], NP Taking Active   ipratropium (ATROVENT) 0.02 % nebulizer solution [id number] Yes Take 2.5 mLs (0.5 mg total) by nebulization 4 (four) times daily.  Patient taking differently: Take 0.5 mg by nebulization every 4 (four) hours as needed (Asthma).   Norval Morton, MD Taking Active Self  isosorbide mononitrate (IMDUR) 30 MG 24 hr tablet [id number] Yes Take 90 mg by mouth daily. [provider] Taking Active Self           Med Note Neville Route Jan 07, 2021  6:03 AM)    loratadine (CLARITIN) 10 MG tablet [id number] Yes Take 10 mg by mouth daily. [provider] Taking Active Self  nitroGLYCERIN (NITROSTAT) 0.4 MG SL tablet [id number] No Place 1 tablet (0.4 mg total) under the tongue every 5 (five) minutes as needed for chest pain.  Patient not taking: Reported on 01/21/2021   [name] Not Taking Expired 01/07/21 2359 Self           Med Note (MENDOZA [name], Alicia A   Thu Jan 27, 2019 11:45 AM)    nystatin cream (MYCOSTATIN) [id number] Yes Apply 1 application topically 2 (two) times daily as needed (rash). [provider] Taking Active Self  OXYGEN [id number] Yes Inhale 3 L/min into the lungs continuous.  [provider] Taking Active Self  pantoprazole (PROTONIX) 40 MG tablet [id number] Yes Take 1 tablet (40 mg total) by mouth 2 (two) times daily. [name], MD Taking Active Self  polyethylene glycol (MIRALAX / GLYCOLAX) 17 g packet [id number] Yes Take 17 g by mouth daily. [provider] Taking Active Self  potassium chloride SA (KLOR-CON) 20 MEQ tablet [id number] Yes Take 20 mEq by mouth every morning. [provider] Taking Active Self  ranolazine (RANEXA) 1000 MG SR tablet [id number] Yes TAKE 1 TABLET BY MOUTH  TWICE DAILY  Patient taking differently: 1,000 mg 2 (two) times daily.    Croitoru, [name], MD Taking Active Self  rosuvastatin (CRESTOR) 40 MG tablet [id number] Yes Take 1 tablet (40 mg total) by mouth every evening. NEED OV. [name], [name], MD Taking Active Self  sucralfate (CARAFATE) 1 g tablet [id number] Yes Take 1 tablet (1 g total) by mouth in the morning, at noon, and at bedtime. As needed  Patient taking differently: Take 1 g by mouth at bedtime.   [name], MD Taking Active Self  Torsemide 40 MG TABS [id number] Yes Take by mouth. Take 40mg  by mouth daily. If weight is over 228lbs, take extra 20mg  (for total of 60mg ) [provider] Taking Active              SDOH:  (Social Determinants of Health) assessments and interventions performed:    Care Plan  Review of patient past medical history, allergies, medications, health status, including review of consultants reports, laboratory and other test data, was performed as part of comprehensive evaluation for care management services.   There are no care plans that you recently modified  to display for this patient.    Plan: The patient has been provided with contact information for the care management team and has been advised to call with any health related questions or concerns.  The care management team will reach out to the patient again over the next 30+ business days.  Firas Guardado L. Lavina Hamman, RN, BSN, [place] Coordinator Office number [phone] [facility] number [phone] Fax number [phone]

## 2021-01-21 NOTE — Progress Notes (Signed)
ADVANCED HF CLINIC NOTE  Patient ID: [name], female   DOB: 02-10-1946, 75 y.o.   MRN: [id number]  Primary Cardiologist: Dr. Dani Gobble Croitoru Primary HF: Dr. Haroldine Laws   HPI: Ms Meece is a 75 year old with history of morbid obesity, DM, HTN, asthma, OSA, ICD, GERD, arthritis, CRT-D 2008 [employer], CAD, S/P CABG x5 11/29/2014.  Discharged from [facility] 12/07/14 s/p CABG. Discharge weight 252.  Went to [facility].  Readmitted 12/21/14 with RLE cellulitis and volume overload with weight of 273 and complaints of dyspnea and orthopnea. RLE cellulitis treated with IV ABX. She was had brisk diuresis with lasix gtt. Diuresis slowed down with slight Cr bump. Acetazolamide added and monitored closely with history of sulfa allergy. Diuresed well, but lasix held with continued Cr bump. Transitioned to po torsemide for d/c. Discharge weight 246 lbs.  Admitted 1/23 through 03/09/16 with CP. CEs negative.   Had CTR-P gen change out 12/31/20  Admitted 01/07/21 with chest pain and A/C HFpEF. HS Trop 20>17. Diuresed with IV lasix and transitioned to torsemide 40 mg daily. Bisoprolol was increased to 10 mg daily. Discharge weight 226 pounds.   On 01/16/21 she called Dr Orene Desanctis and reported 4 pound weight gain. She was instructed to take 60 mg daily until weight went back down. Says she took the higher dose of torsemide for 2 days.   Today she returns for post hospital follow up. Says she fell 2 days ago (mechanical fall) and is unable to stand and weigh. R sided chest discomfort from fall.  Overall feeling fine. SOB with exertion. Denies PND/Orthopnea. Remains on 3 liters [place]. Appetite ok. Tries to follow low salt diet. No fever or chills. Weight at home 229 pounds. Taking all medications. Latricia Heft is the [facility] agency she has been using for PT.  Followed by HHPT 2 times a week. Lives with her son.   Cardiac Studies:  Cath 10/09/16 1. Severe 3v CAD 2. LIMA  to LAD widely patent 3. SVG to Diagonal  widely patent 4. SVG to RCA widely patent 5. SVG to LCx and OM occluded ostially.  Underwent to stent to LCX and Ramus 6. LVEF 60% 7. Mild PAH with normal PCWP and output  Underwent PCI 10/31/16:  -Successful PTCA/DES x 3 AV groove circumflex -Successful PTCA/DES x 1 intermediate branch   Findings:   Ao = 117/61 (83) LV = 126/8 RA = 11 RV = 39/10 PA = 37/16 (25) PCW = 13 Fick cardiac output/index = 5.1/2.3 PVR = 2.4 WU FA sat = 95% PA sat = 65%   Assessment: 1. Severe 3v CAD 2. LIMA  to LAD widely patent 3. SVG to Diagonal widely patent 4. SVG to RCA widely patent 5. SVG to LCx and OM occluded ostially 6. LVEF 60% 7. Mild PAH with normal PCWP and output     Echo 11/24/14 EF 55-60%, Grade 1 DD. Mild TR TEE 11/29/14 EF 55-60%, LVH, Mild AR and MR Echo 9/17: EF 55-60% LVH  Labs 02/21/2015: K 4.5 Creatinine 1.39  Labs 07/12/2015: K 4.4 Creatinine 1.14 Hgb 11.5   Past Medical History:  Diagnosis Date   ABDOMINAL PAIN -GENERALIZED 02/21/2010   Qualifier: Diagnosis of  By: Chester Holstein NP, Nevin Bloodgood     AICD (automatic cardioverter/defibrillator) present    downgraded to CRT-P in 2014   Anemia    Arthritis    Asthma    Atrial fibrillation ([place]) 10/24/2015   Questionable history of A Fib/A Flutter. On device interrogation on 9/7,  showed 0.62min of A Fib/A Flutter with 1% burden.  Device check in Jan 2018 showed no sustained AF (runs of less than 30 seconds). No anticoagulation indicated.   CAD (coronary artery disease)    a. 10/09/16 LHC: SVG->LAD patent, SVG->Diag patent, SVG->RCA patent, SVG->LCx occluded. EF 60%, b. 10/31/16 LHC DES to AV groove Circ, DES to intermed branch   Cellulitis and abscess of foot 12/2014   RT FOOT   CHF (congestive heart failure) (HCC)    Chronic bronchitis (HCC)    Chronic lower back pain    Complete heart block ([place]) 06/22/2015   Diverticulosis    Facial numbness 02/2016   Fatty liver disease, nonalcoholic    Gastritis    GERD  (gastroesophageal reflux disease)    Gout    H/O hiatal hernia    HEMORRHOIDS-EXTERNAL 02/21/2010   Qualifier: Diagnosis of  By: Chester Holstein NP, Paula     High cholesterol    Hyperplastic colon polyp    Hypertension    IBS (irritable bowel syndrome)    Internal hemorrhoids    INTERNAL HEMORRHOIDS WITHOUT MENTION COMP 04/12/2007   Qualifier: Diagnosis of  By: Olevia Perches MD, Dora M    Nonischemic cardiomyopathy ([place]) 11/22/2011   Pt responded to BiV ICD- last EF 55-60% Sept 2017   Obesity    On home oxygen therapy    "2L at night" (10/31/2016)   OSA (obstructive sleep apnea)    "can't tolerate a mask" (10/30/2016)   PERSONAL HX COLONIC POLYPS 02/21/2010   Qualifier: Diagnosis of  By: Chester Holstein NP, Paula     Pneumonia    "couple times" (10/31/2016)   Presence of permanent cardiac pacemaker    11/02/12 [employer] V273 INTUA PPM   Right facial numbness 03/05/2016   Shortness of breath    Stroke ([place]) 09/2020   TIA (transient ischemic attack)    "recently" (10/31/2016)   Type II diabetes mellitus (HCC)     Current Outpatient Medications  Medication Sig Dispense Refill   ACCU-CHEK AVIVA PLUS test strip Check blood sugar twice daily     Accu-Chek Softclix Lancets lancets Check blood sugar twice daily     acetaminophen (TYLENOL) 500 MG tablet Take 1,000 mg by mouth 2 (two) times daily as needed (for pain or headaches).     albuterol (PROVENTIL HFA;VENTOLIN HFA) 108 (90 Base) MCG/ACT inhaler Inhale 2 puffs into the lungs every 6 (six) hours as needed for wheezing or shortness of breath. 1 Inhaler 0   albuterol (PROVENTIL) (2.5 MG/3ML) 0.083% nebulizer solution Take 3 mLs (2.5 mg total) by nebulization every 6 (six) hours as needed for wheezing or shortness of breath. 75 mL 0   Alcohol Swabs (B-D SINGLE USE SWABS REGULAR) PADS      allopurinol (ZYLOPRIM) 100 MG tablet TAKE 1 TABLET BY MOUTH  DAILY (Patient taking differently: Take 100 mg by mouth daily.) 90 tablet 3   aspirin EC 81 MG tablet  Take 1 tablet (81 mg total) by mouth daily. 90 tablet 3   bisoprolol (ZEBETA) 10 MG tablet Take 1 tablet (10 mg total) by mouth daily. 30 tablet 6   budesonide-formoterol (SYMBICORT) 160-4.5 MCG/ACT inhaler Inhale 2 puffs into the lungs 2 (two) times daily.     clopidogrel (PLAVIX) 75 MG tablet Take 1 tablet (75 mg total) by mouth daily. (Patient taking differently: Take 75 mg by mouth at bedtime.) 90 tablet 1   cyclobenzaprine (FLEXERIL) 10 MG tablet Take 5 mg by mouth 2 (two) times daily  as needed for muscle spasms.     Docusate Calcium (STOOL SOFTENER PO) Take 1 tablet by mouth at bedtime.     empagliflozin (JARDIANCE) 10 MG TABS tablet Take 10 mg by mouth daily before breakfast. 90 tablet 3   EPINEPHrine 0.3 mg/0.3 mL IJ SOAJ injection Inject 0.3 mg into the muscle once as needed for anaphylaxis (severe allergic reaction).     ferrous sulfate 325 (65 FE) MG tablet Take 325 mg by mouth daily with breakfast.     fluticasone (FLONASE) 50 MCG/ACT nasal spray Place 1 spray into both nostrils daily.     guaiFENesin (MUCINEX) 600 MG 12 hr tablet Take 1 tablet (600 mg total) by mouth 2 (two) times daily. 30 tablet 1   ipratropium (ATROVENT) 0.02 % nebulizer solution Take 2.5 mLs (0.5 mg total) by nebulization 4 (four) times daily. (Patient taking differently: Take 0.5 mg by nebulization every 4 (four) hours as needed (Asthma).) 75 mL 0   isosorbide mononitrate (IMDUR) 30 MG 24 hr tablet Take 90 mg by mouth daily.     loratadine (CLARITIN) 10 MG tablet Take 10 mg by mouth daily.     nystatin cream (MYCOSTATIN) Apply 1 application topically 2 (two) times daily as needed (rash).     OXYGEN Inhale 3 L/min into the lungs continuous.      pantoprazole (PROTONIX) 40 MG tablet Take 1 tablet (40 mg total) by mouth 2 (two) times daily. 180 tablet 3   polyethylene glycol (MIRALAX / GLYCOLAX) 17 g packet Take 17 g by mouth daily.     potassium chloride SA (KLOR-CON) 20 MEQ tablet Take 20 mEq by mouth every  morning.     ranolazine (RANEXA) 1000 MG SR tablet TAKE 1 TABLET BY MOUTH  TWICE DAILY (Patient taking differently: 1,000 mg 2 (two) times daily.) 180 tablet 1   rosuvastatin (CRESTOR) 40 MG tablet Take 1 tablet (40 mg total) by mouth every evening. NEED OV. 90 tablet 2   sucralfate (CARAFATE) 1 g tablet Take 1 tablet (1 g total) by mouth in the morning, at noon, and at bedtime. As needed (Patient taking differently: Take 1 g by mouth at bedtime.) 90 tablet 3   Torsemide 40 MG TABS Take by mouth. Take 40mg  by mouth daily. If weight is over 228lbs, take extra 20mg  (for total of 60mg )     carboxymethylcellulose (REFRESH PLUS) 0.5 % SOLN Place 1 drop into both eyes every 12 (twelve) hours.     nitroGLYCERIN (NITROSTAT) 0.4 MG SL tablet Place 1 tablet (0.4 mg total) under the tongue every 5 (five) minutes as needed for chest pain. (Patient not taking: Reported on 01/21/2021) 25 tablet 3   No current facility-administered medications for this encounter.    Allergies  Allergen Reactions   Ivp Dye [Iodinated Diagnostic Agents] Shortness Of Breath    No reaction to PO contrast with non-ionic dye.06-25-2014/rsm   Shellfish Allergy Anaphylaxis   Sulfa Antibiotics Shortness Of Breath   Iodine Hives   Atorvastatin Other (See Comments)    Pt states "causes bilateral leg pain/cramps."   Benadryl [Diphenhydramine] Other (See Comments)    "THIS DRIVES ME CRAZY AND MAKES ME FEEL LIKE I AM DYING" No problem last time they took   Colchicine Nausea And Vomiting   Contrast Media [Iodinated Diagnostic Agents] Hives   Doxycycline Other (See Comments)    Unknown reaction, per patient   Uloric [Febuxostat] Other (See Comments)    Unknown reaction   Cephalexin Itching and Rash  Zithromax [Azithromycin] Rash      Social History   Socioeconomic History   Marital status: Widowed    Spouse name: Not on file   Number of children: 5   Years of education: Not on file   Highest education level: Not on file   Occupational History   Occupation: STAFF/BUFFET    Employer: [employer]  Tobacco Use   Smoking status: Former    Packs/day: 0.25    Years: 3.00    Pack years: 0.75    Types: Cigarettes    Quit date: 02/11/1968    Years since quitting: 52.9   Smokeless tobacco: Never  Vaping Use   Vaping Use: Never used  Substance and Sexual Activity   Alcohol use: No   Drug use: No   Sexual activity: Never  Other Topics Concern   Not on file  Social History Narrative   Widowed last year. She lives with her two sons.   Social Determinants of Health   Financial Resource Strain: Not on file  Food Insecurity: No Food Insecurity   Worried About Charity fundraiser in the Last Year: Never true   Ran Out of Food in the Last Year: Never true  Transportation Needs: No Transportation Needs   Lack of Transportation (Medical): No   Lack of Transportation (Non-Medical): No  Physical Activity: Not on file  Stress: Not on file  Social Connections: Not on file  Intimate Partner Violence: Not on file      Family History  Problem Relation Age of Onset   Breast cancer Mother    Diabetes Mother    Heart disease Maternal Grandmother    Kidney disease Maternal Grandmother    Diabetes Maternal Grandmother    Glaucoma Maternal Aunt    Heart disease Maternal Aunt    Asthma Sister    Colon cancer Neg Hx    Stomach cancer Neg Hx    Pancreatic cancer Neg Hx     Vitals:   01/21/21 1121  BP: 130/82  Pulse: 64  SpO2: 93%   Unable to stand for weight due to recent fall.  Wt Readings from Last 3 Encounters:  01/18/21 103.9 kg  01/10/21 102.7 kg  12/31/20 103 kg     PHYSICAL EXAM: General:  Arrived in a wheelchair. No resp difficulty HEENT: normal Neck: supple. JVP difficult to assess due to body habitus but I thinks JVP is 9-10.  Carotids 2+ bilat; no bruits. No lymphadenopathy or thryomegaly appreciated. Cor: PMI nondisplaced. Regular rate & rhythm. No rubs, gallops or  murmurs. Lungs: clear Abdomen: obese, soft, nontender, nondistended. No hepatosplenomegaly. No bruits or masses. Good bowel sounds. Extremities: no cyanosis, clubbing, rash, R and LLE 1-2+ edema Neuro: alert & orientedx3, cranial nerves grossly intact. moves all 4 extremities w/o difficulty. Affect pleasant  ASSESSMENT & PLAN:  1. Chronic systolic/diastolic HF  - Echo 6/43 EF 55-60%, mild LVH.  - RHC in 8/18 looked good. - Echo 12/20 EF down to 40-45% in setting of loss of LV pacing which has now been restored - Echo 4/21 EF 50-55%--->09/2020 EF 55-60% RV normal.  - NYHA III - Volume status difficult to assess but looks elevated. Increase torsemide to 60 mg daily - Check BMET today  - Discussed low salt food and limiting fluids to < 2 liters per day.      2. CAD s/p CABG 11/2014 -  s/p LCX stents in 2018 -  On ranexa + imdur.  - No chest  pain.   3. Morbid obesity Discussed portion control  4. H/o heart block s/p BiV pacer/CRT-D [employer] - had gen change out 12/31/20 by Dr Orene Desanctis  5. Chronic renal failure stage IV - has f/u with Nephrology - Continue jardiance 10 mg daily.  - Check BMET   6. OSA:  - Says she can't tolerate CPAP  7. Chronic respiratory failure - likely OSA/OHS - continue O2 3 liters  . Sats stable.   8. DM2 -Continue jardiance 10 mg daily.   9. CKD Stage IIIa Creatinine baseline 1.5-1.7  Check BMET today.   Follow up with APP in 8 weeks.   Darrick Grinder, NP   11:27 AM

## 2021-01-21 NOTE — Telephone Encounter (Signed)
Called to confirm Heart & Vascular Transitions of Care appointment at [facility] unaware of appt today, requested later time. Able to schedule for 11AM today in APP clinic. Pt agreeable, rescheduled. Pt was last seen in AHF clinic 06/2019 by Dr. Haroldine Laws. [name], NP notified of appt change.  Patient reminded to bring all medications and pill box organizer with them. Confirmed patient has transportation. Gave directions and parking code for garage.  Confirmed appointment prior to ending call.   Pricilla Holm, MSN, RN Heart Failure Nurse Navigator [phone]

## 2021-01-21 NOTE — Patient Instructions (Addendum)
INCREASE TORSEMIDE 60 mg one and one half tab daily  Labs today We will only contact you if something comes back abnormal or we need to make some changes. Otherwise no news is good news!  Your physician recommends that you schedule a follow-up appointment in: 8 weeks  in the Advanced Practitioners (PA/NP) Clinic     Do the following things EVERYDAY: Weigh yourself in the morning before breakfast. Write it down and keep it in a log. Take your medicines as prescribed Eat low salt foods--Limit salt (sodium) to 2000 mg per day.  Stay as active as you can everyday Limit all fluids for the day to less than 2 liters  At the [facility], you and your health needs are our priority. As part of our continuing mission to provide you with exceptional heart care, we have created designated Provider Care Teams. These Care Teams include your primary Cardiologist (physician) and Advanced Practice Providers (APPs- Physician Assistants and Nurse Practitioners) who all work together to provide you with the care you need, when you need it.   You may see any of the following providers on your designated Care Team at your next follow up: Dr Glori Bickers Dr Haynes Kerns, NP Lyda Jester, [place] [facility] [place], [place] Audry Riles, PharmD   Please be sure to bring in all your medications bottles to every appointment.   If you have any questions or concerns before your next appointment please send [place] a message through [place] or call our office at [phone].    TO LEAVE A MESSAGE FOR THE NURSE SELECT OPTION 2, PLEASE LEAVE A MESSAGE INCLUDING: YOUR NAME DATE OF BIRTH CALL BACK NUMBER REASON FOR CALL**this is important as we prioritize the call backs  YOU WILL RECEIVE A CALL BACK THE SAME DAY AS LONG AS YOU CALL BEFORE 4:00 PM

## 2021-01-22 DIAGNOSIS — E1122 Type 2 diabetes mellitus with diabetic chronic kidney disease: Secondary | ICD-10-CM | POA: Diagnosis not present

## 2021-01-22 DIAGNOSIS — I5032 Chronic diastolic (congestive) heart failure: Secondary | ICD-10-CM | POA: Diagnosis not present

## 2021-01-22 DIAGNOSIS — D649 Anemia, unspecified: Secondary | ICD-10-CM | POA: Diagnosis not present

## 2021-01-22 DIAGNOSIS — Z7902 Long term (current) use of antithrombotics/antiplatelets: Secondary | ICD-10-CM | POA: Diagnosis not present

## 2021-01-22 DIAGNOSIS — N183 Chronic kidney disease, stage 3 unspecified: Secondary | ICD-10-CM | POA: Diagnosis not present

## 2021-01-22 DIAGNOSIS — I10 Essential (primary) hypertension: Secondary | ICD-10-CM | POA: Diagnosis not present

## 2021-01-22 DIAGNOSIS — M109 Gout, unspecified: Secondary | ICD-10-CM | POA: Diagnosis not present

## 2021-01-22 DIAGNOSIS — K5900 Constipation, unspecified: Secondary | ICD-10-CM | POA: Diagnosis not present

## 2021-01-22 DIAGNOSIS — G4733 Obstructive sleep apnea (adult) (pediatric): Secondary | ICD-10-CM | POA: Diagnosis not present

## 2021-01-22 DIAGNOSIS — I5031 Acute diastolic (congestive) heart failure: Secondary | ICD-10-CM | POA: Diagnosis not present

## 2021-01-23 DIAGNOSIS — E7800 Pure hypercholesterolemia, unspecified: Secondary | ICD-10-CM | POA: Diagnosis not present

## 2021-01-23 DIAGNOSIS — E1122 Type 2 diabetes mellitus with diabetic chronic kidney disease: Secondary | ICD-10-CM | POA: Diagnosis not present

## 2021-01-23 DIAGNOSIS — I700 Atherosclerosis of aorta: Secondary | ICD-10-CM | POA: Diagnosis not present

## 2021-01-23 DIAGNOSIS — I2510 Atherosclerotic heart disease of native coronary artery without angina pectoris: Secondary | ICD-10-CM | POA: Diagnosis not present

## 2021-01-23 DIAGNOSIS — Z79899 Other long term (current) drug therapy: Secondary | ICD-10-CM | POA: Diagnosis not present

## 2021-01-23 DIAGNOSIS — I10 Essential (primary) hypertension: Secondary | ICD-10-CM | POA: Diagnosis not present

## 2021-01-24 DIAGNOSIS — D649 Anemia, unspecified: Secondary | ICD-10-CM | POA: Diagnosis not present

## 2021-01-24 DIAGNOSIS — M109 Gout, unspecified: Secondary | ICD-10-CM | POA: Diagnosis not present

## 2021-01-24 DIAGNOSIS — I10 Essential (primary) hypertension: Secondary | ICD-10-CM | POA: Diagnosis not present

## 2021-01-24 DIAGNOSIS — E1122 Type 2 diabetes mellitus with diabetic chronic kidney disease: Secondary | ICD-10-CM | POA: Diagnosis not present

## 2021-01-24 DIAGNOSIS — I5031 Acute diastolic (congestive) heart failure: Secondary | ICD-10-CM | POA: Diagnosis not present

## 2021-01-24 DIAGNOSIS — K5900 Constipation, unspecified: Secondary | ICD-10-CM | POA: Diagnosis not present

## 2021-01-24 DIAGNOSIS — G4733 Obstructive sleep apnea (adult) (pediatric): Secondary | ICD-10-CM | POA: Diagnosis not present

## 2021-01-24 DIAGNOSIS — N183 Chronic kidney disease, stage 3 unspecified: Secondary | ICD-10-CM | POA: Diagnosis not present

## 2021-01-24 DIAGNOSIS — I5032 Chronic diastolic (congestive) heart failure: Secondary | ICD-10-CM | POA: Diagnosis not present

## 2021-01-24 DIAGNOSIS — Z7902 Long term (current) use of antithrombotics/antiplatelets: Secondary | ICD-10-CM | POA: Diagnosis not present

## 2021-01-25 ENCOUNTER — Telehealth (HOSPITAL_COMMUNITY): Payer: Self-pay

## 2021-01-25 DIAGNOSIS — I5032 Chronic diastolic (congestive) heart failure: Secondary | ICD-10-CM

## 2021-01-25 MED ORDER — TORSEMIDE 40 MG PO TABS: 40.0000 mg | ORAL_TABLET | Freq: Every day | ORAL | 6 refills | Status: DC

## 2021-01-25 MED ORDER — TORSEMIDE 40 MG PO TABS
40.0000 mg | ORAL_TABLET | Freq: Every day | ORAL | 6 refills | Status: DC
Start: 2021-01-25 — End: 2021-03-29

## 2021-01-25 NOTE — Telephone Encounter (Signed)
Pt aware of results. Advised of recommendations to decrease torsemide to 40mg  daily. Will repeat bmet next week. Verbalized understanding.

## 2021-01-25 NOTE — Telephone Encounter (Signed)
-----   Message from Conrad Wauseon, NP sent at 01/24/2021 11:23 AM EST ----- Renal function up a little. Instruct to cut back torsemide to 40 mg daily. Check BMET next week. Please call.

## 2021-01-29 DIAGNOSIS — G4733 Obstructive sleep apnea (adult) (pediatric): Secondary | ICD-10-CM | POA: Diagnosis not present

## 2021-01-29 DIAGNOSIS — I5031 Acute diastolic (congestive) heart failure: Secondary | ICD-10-CM | POA: Diagnosis not present

## 2021-01-29 DIAGNOSIS — M109 Gout, unspecified: Secondary | ICD-10-CM | POA: Diagnosis not present

## 2021-01-29 DIAGNOSIS — I5032 Chronic diastolic (congestive) heart failure: Secondary | ICD-10-CM | POA: Diagnosis not present

## 2021-01-29 DIAGNOSIS — D649 Anemia, unspecified: Secondary | ICD-10-CM | POA: Diagnosis not present

## 2021-01-29 DIAGNOSIS — Z7902 Long term (current) use of antithrombotics/antiplatelets: Secondary | ICD-10-CM | POA: Diagnosis not present

## 2021-01-29 DIAGNOSIS — E1122 Type 2 diabetes mellitus with diabetic chronic kidney disease: Secondary | ICD-10-CM | POA: Diagnosis not present

## 2021-01-29 DIAGNOSIS — K5900 Constipation, unspecified: Secondary | ICD-10-CM | POA: Diagnosis not present

## 2021-01-29 DIAGNOSIS — I10 Essential (primary) hypertension: Secondary | ICD-10-CM | POA: Diagnosis not present

## 2021-01-29 DIAGNOSIS — N183 Chronic kidney disease, stage 3 unspecified: Secondary | ICD-10-CM | POA: Diagnosis not present

## 2021-01-30 ENCOUNTER — Other Ambulatory Visit: Payer: Self-pay

## 2021-01-30 ENCOUNTER — Other Ambulatory Visit: Payer: Self-pay | Admitting: *Deleted

## 2021-01-30 NOTE — Patient Outreach (Signed)
[facility]) Care Management Telephonic RN Care Manager Note   01/30/2021 Name:  [name] MRN:  [id number] DOB:  Aug 19, 1945  Summary: Follow up outreach Knees feel better after fall Under breast still sore using heating pad  Depressed as she reports a loss of her 76 yo female friend over the weekend Martin Majestic to church on Sunday  Son works -New mobile phone  Want list of possible senior services/events locally Interest in a list of adult day programs and food labels with information about sodium limit/requirements   Decrease mobility only use scooter at stores  Edema/diuretic She saw pcp on 01/25/21 and only reports her pcp request her to hold diuretic to as her kidney function was abnormal She reports she is afraid to because she does not want to have edema Discussed her options and importance of following pcp recommendations  Recommendations/Changes made from today's visit: Encouraged to follow pcp recommendation on intake of diuretic to prevent kidney function abnormalities    Subjective: [name] is an 75 y.o. year old female who is a primary patient of [name], [name], MD. The care management team was consulted for assistance with care management and/or care coordination needs.    Telephonic RN Care Manager completed Telephone Visit today.   Objective:  Medications Reviewed Today     Reviewed by Barbaraann Faster, RN (Registered Nurse) on 01/21/21 at 75  Med List Status: <None>   Medication Order Taking? Sig Documenting Provider Last Dose Status Informant  ACCU-CHEK AVIVA PLUS test strip [id number] No Check blood sugar twice daily [provider] Taking Active Self  Accu-Chek Softclix Lancets lancets [id number] No Check blood sugar twice daily [provider] Taking Active Self  acetaminophen (TYLENOL) 500 MG tablet [id number] No Take 1,000 mg by mouth 2 (two) times daily as needed (for pain or headaches). [provider] Taking Active Self  albuterol (PROVENTIL HFA;VENTOLIN HFA) 108 (90 Base) MCG/ACT inhaler [id number] No Inhale 2 puffs into the lungs every 6 (six) hours as needed for wheezing or shortness of breath. Norval Morton, MD Taking Active Self  albuterol (PROVENTIL) (2.5 MG/3ML) 0.083% nebulizer solution [id number] No Take 3 mLs (2.5 mg total) by nebulization every 6 (six) hours as needed for wheezing or shortness of breath. Norval Morton, MD Taking Active Self  Alcohol Swabs (B-D SINGLE USE SWABS REGULAR) PADS [id number] No  [provider] Taking Active Self  allopurinol (ZYLOPRIM) 100 MG tablet [id number] No TAKE 1 TABLET BY MOUTH  DAILY  Patient taking differently: Take 100 mg by mouth daily.   Croitoru, [name], MD Taking Active Self  aspirin EC 81 MG tablet [id number] No Take 1 tablet (81 mg total) by mouth daily. Erlene Quan, PA-C Taking Active Self  bisoprolol (ZEBETA) 10 MG tablet [id number] No Take 1 tablet (10 mg total) by mouth daily. [name], NP Taking Active   budesonide-formoterol [facility]) 160-4.5 MCG/ACT inhaler [id number] No Inhale 2 puffs into the lungs 2 (two) times daily. [provider] Taking Active Self  carboxymethylcellulose (REFRESH PLUS) 0.5 % SOLN [id number] No Place 1 drop into both eyes every 12 (twelve) hours. [provider] Taking Active Self  clopidogrel (PLAVIX) 75 MG tablet [id number] No Take 1 tablet (75 mg total) by mouth daily.  Patient taking differently: Take 75 mg by mouth at bedtime.   Croitoru, [name], MD Taking Active Self  cyclobenzaprine (FLEXERIL) 10 MG tablet [id number] No Take 5 mg by mouth 2 (two) times daily  as needed for muscle spasms. [provider] Taking Active Self  Docusate Calcium (STOOL SOFTENER PO) [id number] No Take 1 tablet by mouth at bedtime. [provider] Taking Active Self  empagliflozin (JARDIANCE) 10 MG TABS tablet [id number] No Take 10 mg by mouth daily before breakfast.  Bensimhon, Shaune Pascal, MD Taking Active Self           Med Note [name] Dec 27, 2020 11:19 AM)    EPINEPHrine 0.3 mg/0.3 mL IJ SOAJ injection [id number] No Inject 0.3 mg into the muscle once as needed for anaphylaxis (severe allergic reaction). [provider] Taking Active Self  ferrous sulfate 325 (65 FE) MG tablet [id number] No Take 325 mg by mouth daily with breakfast. [provider] Taking Active Self  fluticasone (FLONASE) 50 MCG/ACT nasal spray [id number] No Place 1 spray into both nostrils daily. [provider] Taking Active Self  guaiFENesin (MUCINEX) 600 MG 12 hr tablet [id number] No Take 1 tablet (600 mg total) by mouth 2 (two) times daily. [name], NP Taking Active   ipratropium (ATROVENT) 0.02 % nebulizer solution [id number] No Take 2.5 mLs (0.5 mg total) by nebulization 4 (four) times daily.  Patient taking differently: Take 0.5 mg by nebulization every 4 (four) hours as needed (Asthma).   Norval Morton, MD Taking Active Self  isosorbide mononitrate (IMDUR) 30 MG 24 hr tablet [id number] No Take 90 mg by mouth daily. [provider] Taking Active Self           Med Note [place] Crane, MISTY D   Mon Jan 07, 2021  6:03 AM)    loratadine (CLARITIN) 10 MG tablet [id number] No Take 10 mg by mouth daily. [provider] Taking Active Self  nitroGLYCERIN (NITROSTAT) 0.4 MG SL tablet [id number] No Place 1 tablet (0.4 mg total) under the tongue every 5 (five) minutes as needed for chest pain.  Patient not taking: Reported on 01/21/2021   Ilean China Not Taking Expired 01/07/21 2359 Self           Med Note (MENDOZA MENDEZ, [name]   Thu Jan 27, 2019 11:45 AM)    nystatin cream (MYCOSTATIN) [id number] No Apply 1 application topically 2 (two) times daily as needed (rash). [provider] Taking Active Self  OXYGEN [id number] No Inhale 3 L/min into the lungs continuous.  [provider] Taking Active Self   pantoprazole (PROTONIX) 40 MG tablet [id number] No Take 1 tablet (40 mg total) by mouth 2 (two) times daily. [name], MD Taking Active Self  polyethylene glycol (MIRALAX / GLYCOLAX) 17 g packet [id number] No Take 17 g by mouth daily. [provider] Taking Active Self  potassium chloride SA (KLOR-CON) 20 MEQ tablet [id number] No Take 20 mEq by mouth every morning. [provider] Taking Active Self  ranolazine (RANEXA) 1000 MG SR tablet [id number] No TAKE 1 TABLET BY MOUTH  TWICE DAILY  Patient taking differently: 1,000 mg 2 (two) times daily.   Croitoru, [name], MD Taking Active Self  rosuvastatin (CRESTOR) 40 MG tablet [id number] No Take 1 tablet (40 mg total) by mouth every evening. NEED OV. [name], [name], MD Taking Active Self  sucralfate (CARAFATE) 1 g tablet [id number] No Take 1 tablet (1 g total) by mouth in the morning, at noon, and at bedtime. As needed  Patient taking differently: Take 1 g by mouth at bedtime.   [name], MD Taking Active Self  Torsemide 40 MG TABS  [id number]  Take 60 mg by mouth daily. Darrick Grinder D, NP  Active             Patient Active Problem List   Diagnosis Date Noted   Primary osteoarthritis, left ankle and foot 02/05/2021   Allergy to shellfish 01/15/2021   Decreased estrogen level 01/15/2021   Diabetic renal disease ([place]) 01/15/2021   Gallstones 01/15/2021   Idiopathic gout 01/15/2021   Mild intermittent asthma 01/15/2021   Pure hypercholesterolemia 01/15/2021   CHF (congestive heart failure) ([place]) 01/07/2021   Chronic diastolic heart failure ([place]) 12/31/2020   Nasal congestion with rhinorrhea 10/24/2020   Carotid stenosis 10/01/2020   Ischemic cardiomyopathy 09/27/2020   Hypertension 09/19/2020   Acute left-sided weakness 09/16/2020   Blood blister 08/01/2020   Elevated troponin 06/01/2019   Constipation 10/21/2018   Chronic pain 07/30/2018   Congestive heart failure (CHF) ([place]) 07/30/2018   Supplemental  oxygen dependent 07/30/2018   Leukopenia 05/03/2018   Thrombocytopenia ([place]) 05/03/2018   Pneumonia due to human metapneumovirus 05/02/2018   Chest pain of uncertain etiology 50/53/9767   Allergic rhinitis 04/13/2018   S/P laparoscopic cholecystectomy 04/09/2018   Chronic respiratory failure ([place]) 03/07/2018   Polyp of cecum    Polyp of transverse colon    Positive colorectal cancer screening using Cologuard test    Dysphagia    Acute on chronic renal failure ([place]) 11/20/2017   Coronary artery disease 11/20/2017   Diabetes mellitus without complication ([place]) 34/19/3790   Abdominal pain, epigastric 11/20/2017   Vasomotor rhinitis 10/16/2016   Right facial numbness 03/05/2016   Chronic renal failure syndrome 03/05/2016   Atrial fibrillation ([place]) 10/24/2015   Chest pain with moderate risk of acute coronary syndrome 10/23/2015   History of TIA (transient ischemic attack) 10/22/2015   CHB (complete heart block) ([place]) 06/22/2015   Allergic drug rash due to anti-infective agent 04/29/2015   Fatigue 02/14/2015   Chronic diastolic CHF (congestive heart failure) ([place]) 02/14/2015   Cellulitis 12/21/2014   S/P CABG x 5 11/29/2014   CAD S/P percutaneous coronary angioplasty    Diarrhea 07/12/2014   Generalized abdominal pain 07/12/2014   Acute on chronic diastolic heart failure ([place]) 11/04/2013   Mixed hypercholesterolemia and hypertriglyceridemia 04/22/2013   Vertigo 04/22/2013   Dyspnea on exertion 08/25/2012   Allergic to IV contrast 07/23/2012   Unstable angina ([place]) 07/22/2012   Obesity 11/22/2011   Cardiomyopathy ([place]) 11/22/2011   Gastroesophageal reflux disease 11/22/2011   Back pain 11/22/2011   OSA (obstructive sleep apnea) 11/22/2011   HEMORRHOIDS-EXTERNAL 02/21/2010   NAUSEA 02/21/2010   ABDOMINAL PAIN -GENERALIZED 02/21/2010   PERSONAL HX COLONIC POLYPS 02/21/2010   ANEMIA 04/16/2007   HTN (hypertension), benign 04/16/2007   DIVERTICULOSIS, COLON 04/16/2007    ARTHRITIS 04/16/2007   INTERNAL HEMORRHOIDS WITHOUT MENTION COMP 04/12/2007   Sleep apnea 04/12/2007   Cardiac resynchronization therapy pacemaker (CRT-P) in place 04/12/2007   Gastritis without bleeding 12/14/2006   COLONIC POLYPS, HYPERPLASTIC 09/27/2002   FATTY LIVER DISEASE 02/16/2002   Past Medical History:  Diagnosis Date   ABDOMINAL PAIN -GENERALIZED 02/21/2010   Qualifier: Diagnosis of  By: Chester Holstein NP, Nevin Bloodgood     AICD (automatic cardioverter/defibrillator) present    downgraded to CRT-P in 2014   Anemia    Arthritis    Asthma    Atrial fibrillation ([place]) 10/24/2015   Questionable history of A Fib/A Flutter. On device interrogation on 9/7, showed 0.37min of A Fib/A Flutter with 1% burden.  Device check in Jan 2018  showed no sustained AF (runs of less than 30 seconds). No anticoagulation indicated.   CAD (coronary artery disease)    a. 10/09/16 LHC: SVG->LAD patent, SVG->Diag patent, SVG->RCA patent, SVG->LCx occluded. EF 60%, b. 10/31/16 LHC DES to AV groove Circ, DES to intermed branch   Cellulitis and abscess of foot 12/2014   RT FOOT   CHF (congestive heart failure) (HCC)    Chronic bronchitis (HCC)    Chronic lower back pain    Complete heart block ([place]) 06/22/2015   Diverticulosis    Facial numbness 02/2016   Fatty liver disease, nonalcoholic    Gastritis    GERD (gastroesophageal reflux disease)    Gout    H/O hiatal hernia    HEMORRHOIDS-EXTERNAL 02/21/2010   Qualifier: Diagnosis of  By: Chester Holstein NP, Paula     High cholesterol    Hyperplastic colon polyp    Hypertension    IBS (irritable bowel syndrome)    Internal hemorrhoids    INTERNAL HEMORRHOIDS WITHOUT MENTION COMP 04/12/2007   Qualifier: Diagnosis of  By: Olevia Perches MD, Dora M    Nonischemic cardiomyopathy ([place]) 11/22/2011   Pt responded to BiV ICD- last EF 55-60% Sept 2017   Obesity    On home oxygen therapy    "2L at night" (10/31/2016)   OSA (obstructive sleep apnea)    "can't tolerate a mask"  (10/30/2016)   PERSONAL HX COLONIC POLYPS 02/21/2010   Qualifier: Diagnosis of  By: Chester Holstein NP, Paula     Pneumonia    "couple times" (10/31/2016)   Presence of permanent cardiac pacemaker    11/02/12 [employer] V273 INTUA PPM   Right facial numbness 03/05/2016   Shortness of breath    Stroke ([place]) 09/2020   TIA (transient ischemic attack)    "recently" (10/31/2016)   Type II diabetes mellitus ([place])     SDOH:  (Social Determinants of Health) assessments and interventions performed:    Care Plan  Review of patient past medical history, allergies, medications, health status, including review of consultants reports, laboratory and other test data, was performed as part of comprehensive evaluation for care management services.   There are no care plans that you recently modified to display for this patient.    Plan: The patient has been provided with contact information for the care management team and has been advised to call with any health related questions or concerns.  The care management team will reach out to the patient again over the next 30+ business days.  Marcella Charlson L. Lavina Hamman, RN, BSN, [place] Coordinator Office number [phone] [facility] number [phone] Fax number [phone]

## 2021-02-01 ENCOUNTER — Other Ambulatory Visit (HOSPITAL_COMMUNITY): Payer: Medicare Other

## 2021-02-05 ENCOUNTER — Other Ambulatory Visit: Payer: Self-pay

## 2021-02-05 ENCOUNTER — Encounter: Payer: Self-pay | Admitting: *Deleted

## 2021-02-05 ENCOUNTER — Other Ambulatory Visit: Payer: Self-pay | Admitting: *Deleted

## 2021-02-05 DIAGNOSIS — G4733 Obstructive sleep apnea (adult) (pediatric): Secondary | ICD-10-CM | POA: Diagnosis not present

## 2021-02-05 DIAGNOSIS — I10 Essential (primary) hypertension: Secondary | ICD-10-CM | POA: Diagnosis not present

## 2021-02-05 DIAGNOSIS — M109 Gout, unspecified: Secondary | ICD-10-CM | POA: Diagnosis not present

## 2021-02-05 DIAGNOSIS — J9612 Chronic respiratory failure with hypercapnia: Secondary | ICD-10-CM | POA: Diagnosis not present

## 2021-02-05 DIAGNOSIS — M19072 Primary osteoarthritis, left ankle and foot: Secondary | ICD-10-CM | POA: Insufficient documentation

## 2021-02-05 DIAGNOSIS — I5031 Acute diastolic (congestive) heart failure: Secondary | ICD-10-CM | POA: Diagnosis not present

## 2021-02-05 DIAGNOSIS — Z7902 Long term (current) use of antithrombotics/antiplatelets: Secondary | ICD-10-CM | POA: Diagnosis not present

## 2021-02-05 DIAGNOSIS — K5900 Constipation, unspecified: Secondary | ICD-10-CM | POA: Diagnosis not present

## 2021-02-05 DIAGNOSIS — E1122 Type 2 diabetes mellitus with diabetic chronic kidney disease: Secondary | ICD-10-CM | POA: Diagnosis not present

## 2021-02-05 DIAGNOSIS — J9611 Chronic respiratory failure with hypoxia: Secondary | ICD-10-CM | POA: Diagnosis not present

## 2021-02-05 DIAGNOSIS — D649 Anemia, unspecified: Secondary | ICD-10-CM | POA: Diagnosis not present

## 2021-02-05 DIAGNOSIS — N183 Chronic kidney disease, stage 3 unspecified: Secondary | ICD-10-CM | POA: Diagnosis not present

## 2021-02-05 DIAGNOSIS — I5032 Chronic diastolic (congestive) heart failure: Secondary | ICD-10-CM | POA: Diagnosis not present

## 2021-02-05 DIAGNOSIS — I5022 Chronic systolic (congestive) heart failure: Secondary | ICD-10-CM | POA: Diagnosis not present

## 2021-02-05 NOTE — Patient Outreach (Signed)
[facility]) Care Management Telephonic RN Care Manager Note   03/19/2021 Name:  [name] MRN:  [id number] DOB:  24-Sep-1945  Summary: Follow up outreach with patient  She reports she is doing better and had a good holiday but was only sad still as she lost a best female friend before christmas Depression screen completed again  Allowed her time to ventilate her feelings  She reports she is getting out of the home with her children to become more active  Recommendations/Changes made from today's visit: Review with patient her interest in how to get a personal scooter  Reviewed the general community level process with use of her pcp & HH PT to get orders and local DME company assist to get a one  She voiced understanding Reviewed THN progression- Pt will remain with [place] and medicaid coverage in 2023 and preference is for change in outreach frequency She reports her goals are to work on her weight and maintaining her fluids/edema/CHF in 2023    Subjective: [name] is an 75 y.o. year old female who is a primary patient of [name], [name], MD. The care management team was consulted for assistance with care management and/or care coordination needs.    Telephonic RN Care Manager completed Telephone Visit today.   Objective:  Medications Reviewed Today     Reviewed by Rafael Bihari, FNP (Family Nurse Practitioner) on 03/18/21 at [place] List Status: <None>   Medication Order Taking? Sig Documenting Provider Last Dose Status Informant  ACCU-CHEK AVIVA PLUS test strip [id number] Yes Check blood sugar twice daily [provider] Taking Active Self  Accu-Chek Softclix Lancets lancets [id number] Yes Check blood sugar twice daily [provider] Taking Active Self  acetaminophen (TYLENOL) 500 MG tablet [id number] Yes Take 1,000 mg by mouth 2 (two) times daily as needed (for pain or headaches). [provider] Taking Active  Self  albuterol (PROVENTIL HFA;VENTOLIN HFA) 108 (90 Base) MCG/ACT inhaler [id number] Yes Inhale 2 puffs into the lungs every 6 (six) hours as needed for wheezing or shortness of breath. Norval Morton, MD Taking Active Self  albuterol (PROVENTIL) (2.5 MG/3ML) 0.083% nebulizer solution [id number] Yes Take 3 mLs (2.5 mg total) by nebulization every 6 (six) hours as needed for wheezing or shortness of breath. Norval Morton, MD Taking Active Self  Alcohol Swabs (B-D SINGLE USE SWABS REGULAR) PADS [id number] Yes  [provider] Taking Active Self  allopurinol (ZYLOPRIM) 100 MG tablet [id number] Yes Take 1 tablet (100 mg total) by mouth daily. Croitoru, [name], MD Taking Active   aspirin EC 81 MG tablet [id number] Yes Take 1 tablet (81 mg total) by mouth daily. Erlene Quan, PA-C Taking Active Self  bisoprolol (ZEBETA) 5 MG tablet [id number]  Take 1.5 tablets (7.5 mg total) by mouth daily. [place], [place], [place]  Active   budesonide-formoterol [facility]) 160-4.5 MCG/ACT inhaler [id number] Yes Inhale 2 puffs into the lungs 2 (two) times daily. [provider] Taking Active Self  carboxymethylcellulose (REFRESH PLUS) 0.5 % SOLN [id number] Yes Place 1 drop into both eyes every 12 (twelve) hours. [provider] Taking Active Self  clopidogrel (PLAVIX) 75 MG tablet [id number] Yes Take 1 tablet (75 mg total) by mouth daily. Croitoru, [name], MD Taking Active   cyclobenzaprine (FLEXERIL) 10 MG tablet [id number] Yes Take 5 mg by mouth 2 (two) times daily as needed for muscle spasms. [provider] Taking Active Self  Docusate Calcium (STOOL SOFTENER PO)  [id number] Yes Take 1 tablet by mouth at bedtime. [provider] Taking Active Self  empagliflozin (JARDIANCE) 10 MG TABS tablet [id number] Yes Take 10 mg by mouth daily before breakfast. Bensimhon, Shaune Pascal, MD Taking Active Self           Med Note [name] Dec 27, 2020 11:19 AM)    EPINEPHrine 0.3 mg/0.3  mL IJ SOAJ injection [id number] Yes Inject 0.3 mg into the muscle once as needed for anaphylaxis (severe allergic reaction). [provider] Taking Active Self  ferrous sulfate 325 (65 FE) MG tablet [id number] Yes Take 325 mg by mouth daily with breakfast. [provider] Taking Active Self  fluconazole (DIFLUCAN) 150 MG tablet [id number] Yes Take 1 tablet (150 mg total) by mouth daily. [place], [place], [place]  Active   fluticasone [facility]) 50 MCG/ACT nasal spray [id number] Yes Place 1 spray into both nostrils daily as needed. [provider] Taking Active Self  guaiFENesin (MUCINEX) 600 MG 12 hr tablet [id number] Yes Take 1 tablet (600 mg total) by mouth 2 (two) times daily. [name], NP Taking Active   ipratropium (ATROVENT) 0.02 % nebulizer solution [id number] Yes Take 2.5 mLs (0.5 mg total) by nebulization 4 (four) times daily.  Patient taking differently: Take 0.5 mg by nebulization every 4 (four) hours as needed (Asthma).   Norval Morton, MD Taking Active Self  ipratropium (ATROVENT) 0.06 % nasal spray [id number] Yes Place into both nostrils. [provider] Taking Active   isosorbide mononitrate (IMDUR) 30 MG 24 hr tablet [id number] Yes Take 90 mg by mouth daily. [provider] Taking Active Self           Med Note Neville Route Jan 07, 2021  6:03 AM)    loratadine (CLARITIN) 10 MG tablet [id number] Yes Take 10 mg by mouth daily. [provider] Taking Active Self  nitroGLYCERIN (NITROSTAT) 0.4 MG SL tablet [id number] Yes Place 1 tablet (0.4 mg total) under the tongue every 5 (five) minutes as needed for chest pain. Erlene Quan, PA-C Taking Active Self           Med Note [facility], [name]   Thu Jan 27, 2019 11:45 AM)    nystatin cream (MYCOSTATIN) [id number] Yes Apply 1 application topically 2 (two) times daily as needed (rash). [provider] Taking Active Self  OXYGEN [id number] Yes Inhale 3 L/min into the  lungs continuous.  [provider] Taking Active Self  pantoprazole (PROTONIX) 40 MG tablet [id number] Yes Take 1 tablet (40 mg total) by mouth 2 (two) times daily. [name], MD Taking Active Self  polyethylene glycol (MIRALAX / GLYCOLAX) 17 g packet [id number] Yes Take 17 g by mouth daily. [provider] Taking Active Self  potassium chloride SA (KLOR-CON) 20 MEQ tablet [id number] Yes Take 20 mEq by mouth every morning. [provider] Taking Active Self  predniSONE (DELTASONE) 5 MG tablet [id number] Yes Take by mouth. [provider] Taking Active   ranolazine (RANEXA) 1000 MG SR tablet [id number] Yes TAKE 1 TABLET BY MOUTH  TWICE DAILY Croitoru, [name], MD Taking Active Self  rosuvastatin (CRESTOR) 40 MG tablet [id number] Yes Take 1 tablet (40 mg total) by mouth every evening. NEED OV. [name], [name], MD Taking Active Self  sucralfate (CARAFATE) 1 g tablet [id number] Yes Take 1 tablet (1 g total) by mouth in the morning, at noon, and at bedtime. As needed  Patient taking differently:  Take 1 g by mouth at bedtime.   [name], MD Taking Active Self  Torsemide 40 MG TABS [id number] Yes Take 40 mg by mouth daily. Darrick Grinder D, NP Taking Active            Patient Active Problem List   Diagnosis Date Noted   Primary osteoarthritis, left ankle and foot 02/05/2021   Allergy to shellfish 01/15/2021   Decreased estrogen level 01/15/2021   Diabetic renal disease ([place]) 01/15/2021   Gallstones 01/15/2021   Idiopathic gout 01/15/2021   Mild intermittent asthma 01/15/2021   Pure hypercholesterolemia 01/15/2021   CHF (congestive heart failure) ([place]) 01/07/2021   Chronic diastolic heart failure ([place]) 12/31/2020   Nasal congestion with rhinorrhea 10/24/2020   Carotid stenosis 10/01/2020   Ischemic cardiomyopathy 09/27/2020   Hypertension 09/19/2020   Acute left-sided weakness 09/16/2020   Blood blister 08/01/2020   Elevated troponin 06/01/2019    Constipation 10/21/2018   Chronic pain 07/30/2018   Congestive heart failure (CHF) ([place]) 07/30/2018   Supplemental oxygen dependent 07/30/2018   Leukopenia 05/03/2018   Thrombocytopenia ([place]) 05/03/2018   Pneumonia due to human metapneumovirus 05/02/2018   Chest pain of uncertain etiology 81/27/5170   Allergic rhinitis 04/13/2018   S/P laparoscopic cholecystectomy 04/09/2018   Chronic respiratory failure ([place]) 03/07/2018   Polyp of cecum    Polyp of transverse colon    Positive colorectal cancer screening using Cologuard test    Dysphagia    Acute on chronic renal failure ([place]) 11/20/2017   Coronary artery disease 11/20/2017   Diabetes mellitus without complication ([place]) 01/74/9449   Abdominal pain, epigastric 11/20/2017   Vasomotor rhinitis 10/16/2016   Right facial numbness 03/05/2016   Chronic renal failure syndrome 03/05/2016   Atrial fibrillation ([place]) 10/24/2015   Chest pain with moderate risk of acute coronary syndrome 10/23/2015   History of TIA (transient ischemic attack) 10/22/2015   CHB (complete heart block) ([place]) 06/22/2015   Allergic drug rash due to anti-infective agent 04/29/2015   Fatigue 02/14/2015   Chronic diastolic CHF (congestive heart failure) ([place]) 02/14/2015   Cellulitis 12/21/2014   S/P CABG x 5 11/29/2014   CAD S/P percutaneous coronary angioplasty    Diarrhea 07/12/2014   Generalized abdominal pain 07/12/2014   Acute on chronic diastolic heart failure ([place]) 11/04/2013   Mixed hypercholesterolemia and hypertriglyceridemia 04/22/2013   Vertigo 04/22/2013   Dyspnea on exertion 08/25/2012   Allergic to IV contrast 07/23/2012   Unstable angina ([place]) 07/22/2012   Obesity 11/22/2011   Cardiomyopathy ([place]) 11/22/2011   Gastroesophageal reflux disease 11/22/2011   Back pain 11/22/2011   OSA (obstructive sleep apnea) 11/22/2011   HEMORRHOIDS-EXTERNAL 02/21/2010   NAUSEA 02/21/2010   ABDOMINAL PAIN -GENERALIZED 02/21/2010   PERSONAL HX COLONIC POLYPS  02/21/2010   ANEMIA 04/16/2007   HTN (hypertension), benign 04/16/2007   DIVERTICULOSIS, COLON 04/16/2007   ARTHRITIS 04/16/2007   INTERNAL HEMORRHOIDS WITHOUT MENTION COMP 04/12/2007   Sleep apnea 04/12/2007   Cardiac resynchronization therapy pacemaker (CRT-P) in place 04/12/2007   Gastritis without bleeding 12/14/2006   COLONIC POLYPS, HYPERPLASTIC 09/27/2002   FATTY LIVER DISEASE 02/16/2002     SDOH:  (Social Determinants of Health) assessments and interventions performed:  SDOH Interventions    Flowsheet Row Most Recent Value  SDOH Interventions   Intimate Partner Violence Interventions Intervention Not Indicated  Social Connections Interventions Intervention Not Indicated  Transportation Interventions Intervention Not Indicated       Care Plan  Review of patient past medical history, allergies,  medications, health status, including review of consultants reports, laboratory and other test data, was performed as part of comprehensive evaluation for care management services.   Care Plan : RN Care Manager Plan of Care  Updates made by Barbaraann Faster, RN since 03/19/2021 12:00 AM     Problem: Complex Care Coordination Needs and disease management in patient with CHF, Falls, Weight   Priority: High     Long-Range Goal: Establish Plan of Care for Management Complex SDOH Barriers, disease management and Care Coordination Needs in patient with CHF, falls, weight management   This Visit's Progress: On track  Priority: High  Note:   Current Barriers:  Knowledge Deficits related to plan of care for management of CHF  Care Coordination needs related to Limited education about CHF* Barriers: Knowledge deficits, health behaviors, Fall history, recurrent edema 01/21/21 fall x 2 continues with edema, eyes better  01/30/21 loss female friend, voice interest in local senior socialization resources. Concern with holding diuretic r/t possible increase in edema 02/05/21 reports she  doing better and is getting out of the home with her children to become more active She reports her goals are to work on her weight and maintaining her fluids/edema/CHF in 2023  RNCM Clinical Goal(s):  Patient will verbalize understanding of plan for management of CHF as evidenced by decrease admissions  through collaboration with [place], provider, and care team.   Interventions: Outreaches as agreed for care coordination and disease management Inter-disciplinary care team collaboration (see longitudinal plan of care) Evaluation of current treatment plan related to  self management and patient's adherence to plan as established by provider 02/05/21 Review with patient her interest in how to get a personal scooter  Reviewed the general community level process with use of her pcp & HH PT to get orders and local DME company assist to get a one  She voiced understanding Reviewed THN progression- Pt will remain with [place] and medicaid coverage in 2023 and preference is for change in outreach frequency  Heart Failure Interventions:  (Status:  Goal on track:  Yes.) Long Term Goal Basic overview and discussion of pathophysiology of Heart Failure reviewed Provided education on low sodium diet Discussed importance of daily weight and advised patient to weigh and record daily Reviewed role of diuretics in prevention of fluid overload and management of heart failure; Discussed the importance of keeping all appointments with provider Screening for signs and symptoms of depression related to chronic disease state  Assessed social determinant of health barriers   Patient Goals/Self-Care Activities: Take all medications as prescribed Attend all scheduled provider appointments Attend church or other social activities Perform all self care activities independently  Perform IADL's (shopping, preparing meals, housekeeping, managing finances) independently Call provider office for new  concerns or questions  call office if I gain more than 2 pounds in one day or 5 pounds in one week keep legs up while sitting use salt in moderation watch for swelling in feet, ankles and legs every day  Follow Up Plan:  The patient has been provided with contact information for the care management team and has been advised to call with any health related questions or concerns.  The care management team will reach out to the patient again over the next 60 business days.       Plan: The patient has been provided with contact information for the care management team and has been advised to call with any health related questions or concerns.  The care management team will reach out to the patient again over the next 60+ business days.  Letta Cargile L. Lavina Hamman, RN, BSN, [place] Coordinator Office number [phone] [facility] number [phone] Fax number [phone]

## 2021-02-12 DIAGNOSIS — N183 Chronic kidney disease, stage 3 unspecified: Secondary | ICD-10-CM | POA: Diagnosis not present

## 2021-02-12 DIAGNOSIS — D649 Anemia, unspecified: Secondary | ICD-10-CM | POA: Diagnosis not present

## 2021-02-12 DIAGNOSIS — I5031 Acute diastolic (congestive) heart failure: Secondary | ICD-10-CM | POA: Diagnosis not present

## 2021-02-12 DIAGNOSIS — K5900 Constipation, unspecified: Secondary | ICD-10-CM | POA: Diagnosis not present

## 2021-02-12 DIAGNOSIS — G4733 Obstructive sleep apnea (adult) (pediatric): Secondary | ICD-10-CM | POA: Diagnosis not present

## 2021-02-12 DIAGNOSIS — I10 Essential (primary) hypertension: Secondary | ICD-10-CM | POA: Diagnosis not present

## 2021-02-12 DIAGNOSIS — E1122 Type 2 diabetes mellitus with diabetic chronic kidney disease: Secondary | ICD-10-CM | POA: Diagnosis not present

## 2021-02-12 DIAGNOSIS — I5032 Chronic diastolic (congestive) heart failure: Secondary | ICD-10-CM | POA: Diagnosis not present

## 2021-02-12 DIAGNOSIS — M109 Gout, unspecified: Secondary | ICD-10-CM | POA: Diagnosis not present

## 2021-02-12 DIAGNOSIS — Z7902 Long term (current) use of antithrombotics/antiplatelets: Secondary | ICD-10-CM | POA: Diagnosis not present

## 2021-02-12 DIAGNOSIS — Z7689 Persons encountering health services in other specified circumstances: Secondary | ICD-10-CM | POA: Diagnosis not present

## 2021-02-13 ENCOUNTER — Other Ambulatory Visit: Payer: Self-pay

## 2021-02-13 ENCOUNTER — Encounter (HOSPITAL_COMMUNITY): Payer: Self-pay

## 2021-02-13 ENCOUNTER — Emergency Department (HOSPITAL_COMMUNITY): Payer: Medicare Other

## 2021-02-13 ENCOUNTER — Ambulatory Visit
Admission: EM | Admit: 2021-02-13 | Discharge: 2021-02-13 | Disposition: A | Discharge status: Home / Self Care | Payer: Medicare Other

## 2021-02-13 ENCOUNTER — Emergency Department (HOSPITAL_COMMUNITY)
Admission: EM | Admit: 2021-02-13 | Discharge: 2021-02-14 | Disposition: A | Discharge status: Home / Self Care | Payer: Medicare Other | Attending: Emergency Medicine | Admitting: Emergency Medicine

## 2021-02-13 DIAGNOSIS — R600 Localized edema: Secondary | ICD-10-CM

## 2021-02-13 DIAGNOSIS — I4891 Unspecified atrial fibrillation: Secondary | ICD-10-CM | POA: Diagnosis not present

## 2021-02-13 DIAGNOSIS — M7732 Calcaneal spur, left foot: Secondary | ICD-10-CM | POA: Diagnosis not present

## 2021-02-13 DIAGNOSIS — Z7984 Long term (current) use of oral hypoglycemic drugs: Secondary | ICD-10-CM | POA: Insufficient documentation

## 2021-02-13 DIAGNOSIS — I509 Heart failure, unspecified: Secondary | ICD-10-CM | POA: Insufficient documentation

## 2021-02-13 DIAGNOSIS — L03116 Cellulitis of left lower limb: Secondary | ICD-10-CM | POA: Diagnosis not present

## 2021-02-13 DIAGNOSIS — Z7901 Long term (current) use of anticoagulants: Secondary | ICD-10-CM | POA: Diagnosis not present

## 2021-02-13 DIAGNOSIS — E119 Type 2 diabetes mellitus without complications: Secondary | ICD-10-CM | POA: Insufficient documentation

## 2021-02-13 DIAGNOSIS — Z7982 Long term (current) use of aspirin: Secondary | ICD-10-CM | POA: Diagnosis not present

## 2021-02-13 DIAGNOSIS — M7989 Other specified soft tissue disorders: Secondary | ICD-10-CM | POA: Diagnosis present

## 2021-02-13 NOTE — ED Notes (Signed)
Pts home O2 tank has run empty. Pt placed on 3L [place] with hospital O2 tank.

## 2021-02-13 NOTE — ED Notes (Signed)
Patient transported to X-ray 

## 2021-02-13 NOTE — Discharge Instructions (Signed)
Please go to the hospital as soon as you leave urgent care for further evaluation and management. 

## 2021-02-13 NOTE — ED Triage Notes (Signed)
Pt c/o left leg and foot edema. Also c/o a "blood blister" that has opened and drained. States PCP asked her to be seen by UC.

## 2021-02-13 NOTE — ED Provider Triage Note (Signed)
Emergency Medicine Provider Triage Evaluation Note  TAIYANA KISSLER , a 76 y.o. female  was evaluated in triage.  Pt complains of bilateral leg swelling, left worse than right. Blister to left 2nd toe with diffuse foot pain. Went to [place] and sent to the ER to rule out DVT. No history of PE or DVT.   Review of Systems  Positive: Leg swelling, foot pain Negative: fever  Physical Exam  BP 129/81 (BP Location: Left Arm)    Pulse 67    Temp 98 F (36.7 C) (Oral)    Resp 18    Ht 5\' 3"  (1.6 m)    Wt 103.9 kg    SpO2 97%    BMI 40.57 kg/m  Gen:   Awake, no distress   Resp:  Normal effort  MSK:   Moves extremities without difficulty  Other:  Swelling to lower extremities, L>R mild erythema to anterior lower leg, small wound to left 2nd toe. No calf tenderness or palpable cord  Medical Decision Making  Medically screening exam initiated at 7:59 PM.  Appropriate orders placed.  ARLEATHA PHILIPPS was informed that the remainder of the evaluation will be completed by another provider, this initial triage assessment does not replace that evaluation, and the importance of remaining in the ED until their evaluation is complete.     Tacy Learn, PA-C 02/13/21 2007    Jeanell Sparrow, DO 02/14/21 [phone]

## 2021-02-13 NOTE — ED Triage Notes (Addendum)
Pt was sent from Urgent Care to r/o DVT in left leg due to increased redness and swelling of left leg and foot. She also has an open blister to left foot second toe. She reports SOB but denied CP. 3L [place] at baseline.

## 2021-02-13 NOTE — ED Provider Notes (Signed)
[facility]    CSN: [id number] Arrival date & time: 02/13/21  1621      History   Chief Complaint Chief Complaint  Patient presents with   left leg/foot edema    HPI [name] is a 76 y.o. female.   Patient presents with left lower foot, leg, calf swelling with associated redness and pain that started yesterday.  Patient reports that she had an ulcer-like lesion on her left second toe that popped during her sleep prior to symptoms starting.  Denies any numbness or tingling to that area.  Denies any injury to the area.  Denies any known fevers.  Denies chest pain or shortness of breath.    Past Medical History:  Diagnosis Date   ABDOMINAL PAIN -GENERALIZED 02/21/2010   Qualifier: Diagnosis of  By: Chester Holstein NP, Nevin Bloodgood     AICD (automatic cardioverter/defibrillator) present    downgraded to CRT-P in 2014   Anemia    Arthritis    Asthma    Atrial fibrillation ([place]) 10/24/2015   Questionable history of A Fib/A Flutter. On device interrogation on 9/7, showed 0.32min of A Fib/A Flutter with 1% burden.  Device check in Jan 2018 showed no sustained AF (runs of less than 30 seconds). No anticoagulation indicated.   CAD (coronary artery disease)    a. 10/09/16 LHC: SVG->LAD patent, SVG->Diag patent, SVG->RCA patent, SVG->LCx occluded. EF 60%, b. 10/31/16 LHC DES to AV groove Circ, DES to intermed branch   Cellulitis and abscess of foot 12/2014   RT FOOT   CHF (congestive heart failure) (HCC)    Chronic bronchitis (HCC)    Chronic lower back pain    Complete heart block ([place]) 06/22/2015   Diverticulosis    Facial numbness 02/2016   Fatty liver disease, nonalcoholic    Gastritis    GERD (gastroesophageal reflux disease)    Gout    H/O hiatal hernia    HEMORRHOIDS-EXTERNAL 02/21/2010   Qualifier: Diagnosis of  By: Chester Holstein NP, Paula     High cholesterol    Hyperplastic colon polyp    Hypertension    IBS (irritable bowel syndrome)    Internal hemorrhoids     INTERNAL HEMORRHOIDS WITHOUT MENTION COMP 04/12/2007   Qualifier: Diagnosis of  By: Olevia Perches MD, Dora M    Nonischemic cardiomyopathy ([place]) 11/22/2011   Pt responded to BiV ICD- last EF 55-60% Sept 2017   Obesity    On home oxygen therapy    "2L at night" (10/31/2016)   OSA (obstructive sleep apnea)    "can't tolerate a mask" (10/30/2016)   PERSONAL HX COLONIC POLYPS 02/21/2010   Qualifier: Diagnosis of  By: Chester Holstein NP, Paula     Pneumonia    "couple times" (10/31/2016)   Presence of permanent cardiac pacemaker    11/02/12 [employer] V273 INTUA PPM   Right facial numbness 03/05/2016   Shortness of breath    Stroke ([place]) 09/2020   TIA (transient ischemic attack)    "recently" (10/31/2016)   Type II diabetes mellitus [facility])     Patient Active Problem List   Diagnosis Date Noted   Primary osteoarthritis, left ankle and foot 02/05/2021   Allergy to shellfish 01/15/2021   Decreased estrogen level 01/15/2021   Diabetic renal disease ([place]) 01/15/2021   Gallstones 01/15/2021   Idiopathic gout 01/15/2021   Mild intermittent asthma 01/15/2021   Pure hypercholesterolemia 01/15/2021   CHF (congestive heart failure) ([place]) 01/07/2021   Chronic diastolic heart failure ([place]) 12/31/2020  Nasal congestion with rhinorrhea 10/24/2020   Carotid stenosis 10/01/2020   Ischemic cardiomyopathy 09/27/2020   Hypertension 09/19/2020   Acute left-sided weakness 09/16/2020   Blood blister 08/01/2020   Elevated troponin 06/01/2019   Constipation 10/21/2018   Chronic pain 07/30/2018   Congestive heart failure (CHF) ([place]) 07/30/2018   Supplemental oxygen dependent 07/30/2018   Leukopenia 05/03/2018   Thrombocytopenia ([place]) 05/03/2018   Pneumonia due to human metapneumovirus 05/02/2018   Chest pain of uncertain etiology 62/70/3500   Allergic rhinitis 04/13/2018   S/P laparoscopic cholecystectomy 04/09/2018   Chronic respiratory failure ([place]) 03/07/2018   Polyp of cecum    Polyp of transverse  colon    Positive colorectal cancer screening using Cologuard test    Dysphagia    Acute on chronic renal failure ([place]) 11/20/2017   Coronary artery disease 11/20/2017   Diabetes mellitus without complication ([place]) 93/81/8299   Abdominal pain, epigastric 11/20/2017   Vasomotor rhinitis 10/16/2016   Right facial numbness 03/05/2016   Chronic renal failure syndrome 03/05/2016   Atrial fibrillation ([place]) 10/24/2015   Chest pain with moderate risk of acute coronary syndrome 10/23/2015   History of TIA (transient ischemic attack) 10/22/2015   CHB (complete heart block) ([place]) 06/22/2015   Allergic drug rash due to anti-infective agent 04/29/2015   Fatigue 02/14/2015   Chronic diastolic CHF (congestive heart failure) ([place]) 02/14/2015   Cellulitis 12/21/2014   S/P CABG x 5 11/29/2014   CAD S/P percutaneous coronary angioplasty    Diarrhea 07/12/2014   Generalized abdominal pain 07/12/2014   Acute on chronic diastolic heart failure ([place]) 11/04/2013   Mixed hypercholesterolemia and hypertriglyceridemia 04/22/2013   Vertigo 04/22/2013   Dyspnea on exertion 08/25/2012   Allergic to IV contrast 07/23/2012   Unstable angina (HCC) 07/22/2012   Obesity 11/22/2011   Cardiomyopathy ([place]) 11/22/2011   Gastroesophageal reflux disease 11/22/2011   Back pain 11/22/2011   OSA (obstructive sleep apnea) 11/22/2011   HEMORRHOIDS-EXTERNAL 02/21/2010   NAUSEA 02/21/2010   ABDOMINAL PAIN -GENERALIZED 02/21/2010   PERSONAL HX COLONIC POLYPS 02/21/2010   ANEMIA 04/16/2007   HTN (hypertension), benign 04/16/2007   DIVERTICULOSIS, COLON 04/16/2007   ARTHRITIS 04/16/2007   INTERNAL HEMORRHOIDS WITHOUT MENTION COMP 04/12/2007   Sleep apnea 04/12/2007   Cardiac resynchronization therapy pacemaker (CRT-P) in place 04/12/2007   Gastritis without bleeding 12/14/2006   COLONIC POLYPS, HYPERPLASTIC 09/27/2002   FATTY LIVER DISEASE 02/16/2002    Past Surgical History:  Procedure Laterality Date   ABDOMINAL  ULTRASOUND  12/01/2011   Peripelvic cysts- #1- 2.4x1.9x2.3cm, #2-1.2x0.9x1.2cm   ANKLE FRACTURE SURGERY Right    "had rod put in"   ANTERIOR CERVICAL DECOMP/DISCECTOMY FUSION     APPENDECTOMY     BACK SURGERY     BIOPSY  12/29/2017   Procedure: BIOPSY;  Surgeon: [name], MD;  Location: [facility];  Service: Endoscopy;;   BIV ICD INSERTION CRT-D  2001?   BIV PACEMAKER GENERATOR CHANGE OUT N/A 11/02/2012   Procedure: BIV PACEMAKER GENERATOR CHANGE OUT;  Surgeon: Sanda Klein, MD;  Location: [facility];  Service: Cardiovascular;  Laterality: N/A;   CARDIAC CATHETERIZATION  05/17/1999   No significant coronary obstructive disease w/ mild 20% luminal irregularity of the first diag branch of the LAD   CARDIAC CATHETERIZATION  07/08/2002   No significant CAD, moderately depressed LV systolic function   CARDIAC CATHETERIZATION Bilateral 04/26/2007   Normal findings, recommend medical therapy   CARDIAC CATHETERIZATION  02/18/2008   Moderate CAD, would benefit from having  a functional study, recommend continue medical therapy   CARDIAC CATHETERIZATION  07/23/2012   Medical therapy   CARDIAC CATHETERIZATION N/A 11/24/2014   Procedure: Left Heart Cath and Coronary Angiography;  Surgeon: Troy Sine, MD;  Location: [facility];  Service: Cardiovascular;  Laterality: N/A;   CARDIAC CATHETERIZATION  11/27/2014   Procedure: Intravascular Pressure Wire/FFR Study;  Surgeon: Peter M Martinique, MD;  Location: [facility];  Service: Cardiovascular;;   CARDIAC CATHETERIZATION  10/09/2016   CHOLECYSTECTOMY N/A 04/09/2018   Procedure: LAPAROSCOPIC CHOLECYSTECTOMY;  Surgeon: Greer Pickerel, MD;  [facility]: [facility];  Service: General;  Laterality: N/A;   COLONOSCOPY WITH PROPOFOL N/A 12/29/2017   Procedure: COLONOSCOPY WITH PROPOFOL;  Surgeon: [name], MD;  [facility];  Service: Endoscopy;  Laterality: N/A;   CORONARY ANGIOGRAM  2010   CORONARY ARTERY BYPASS GRAFT N/A  11/29/2014   Procedure: CORONARY ARTERY BYPASS GRAFTING x 5 (LIMA-LAD, SVG-D, SVG-OM1-OM2, SVG-PD);  Surgeon: Melrose Nakayama, MD;  Location: [facility];  Service: Open Heart Surgery;  Laterality: N/A;   CORONARY STENT INTERVENTION N/A 10/31/2016   Procedure: CORONARY STENT INTERVENTION;  Surgeon: Burnell Blanks, MD;  Location: [place];  Service: Cardiovascular;  Laterality: N/A;   ESOPHAGOGASTRODUODENOSCOPY (EGD) WITH PROPOFOL N/A 12/29/2017   Procedure: ESOPHAGOGASTRODUODENOSCOPY (EGD) WITH PROPOFOL;  Surgeon: [name], MD;  [facility];  Service: Endoscopy;  Laterality: N/A;   FRACTURE SURGERY     INSERT / REPLACE / [place] ARTHROSCOPY Bilateral    LEFT HEART CATH AND CORS/GRAFTS ANGIOGRAPHY N/A 12/09/2017   Procedure: LEFT HEART CATH AND CORS/GRAFTS ANGIOGRAPHY;  Surgeon: Troy Sine, MD;  Location: [facility];  Service: Cardiovascular;  Laterality: N/A;   LEFT HEART CATHETERIZATION WITH CORONARY ANGIOGRAM N/A 07/23/2012   Procedure: LEFT HEART CATHETERIZATION WITH CORONARY ANGIOGRAM;  Surgeon: Leonie Man, MD;  Location: [facility];  Service: Cardiovascular;  Laterality: N/A;   LEXISCAN MYOVIEW  11/14/2011   Mild-moderate defect seen in Mid Inferolateral and Mid Anterolateral regions-consistant w/ infarct/scar. No significant ischemia demonstrated.   POLYPECTOMY  12/29/2017   Procedure: POLYPECTOMY;  Surgeon: [name], MD;  Location: [facility];  Service: Endoscopy;;   PPM GENERATOR CHANGEOUT N/A 12/31/2020   Procedure: PPM GENERATOR CHANGEOUT;  Surgeon: Sanda Klein, MD;  Location: [facility];  Service: Cardiovascular;  Laterality: N/A;   RIGHT/LEFT HEART CATH AND CORONARY ANGIOGRAPHY N/A 10/09/2016   Procedure: RIGHT/LEFT HEART CATH AND CORONARY ANGIOGRAPHY;  Surgeon: Jolaine Artist, MD;  Location: [facility];  Service: Cardiovascular;  Laterality: N/A;   RIGHT/LEFT HEART CATH  AND CORONARY/GRAFT ANGIOGRAPHY N/A 03/11/2019   Procedure: RIGHT/LEFT HEART CATH AND CORONARY/GRAFT ANGIOGRAPHY;  Surgeon: Jolaine Artist, MD;  Location: [facility];  Service: Cardiovascular;  Laterality: N/A;   TEE WITHOUT CARDIOVERSION N/A 11/29/2014   Procedure: TRANSESOPHAGEAL ECHOCARDIOGRAM (TEE);  Surgeon: Melrose Nakayama, MD;  Location: [facility];  Service: Open Heart Surgery;  Laterality: N/A;   TRANSCAROTID ARTERY REVASCULARIZATION  Right 10/01/2020   Procedure: RIGHT TRANSCAROTID ARTERY REVASCULARIZATION;  Surgeon: [name], MD;  Location: [facility];  Service: Vascular;  Laterality: Right;   TRANSTHORACIC ECHOCARDIOGRAM  07/23/2012   EF 55-60%, normal-mild   TUBAL LIGATION     ULTRASOUND GUIDANCE FOR VASCULAR ACCESS Left 10/01/2020   Procedure: ULTRASOUND GUIDANCE FOR VASCULAR ACCESS;  Surgeon: [name], MD;  Location: [facility];  Service:  Vascular;  Laterality: Left;    OB History   No obstetric history on file.      Home Medications    Prior to Admission medications   Medication Sig Start Date End Date Taking? Authorizing Provider  ACCU-CHEK AVIVA PLUS test strip Check blood sugar twice daily 10/19/19   [provider]  Accu-Chek Softclix Lancets lancets Check blood sugar twice daily 08/10/19   [provider]  acetaminophen (TYLENOL) 500 MG tablet Take 1,000 mg by mouth 2 (two) times daily as needed (for pain or headaches).    [provider]  albuterol (PROVENTIL HFA;VENTOLIN HFA) 108 (90 Base) MCG/ACT inhaler Inhale 2 puffs into the lungs every 6 (six) hours as needed for wheezing or shortness of breath. 05/02/18   Norval Morton, MD  albuterol (PROVENTIL) (2.5 MG/3ML) 0.083% nebulizer solution Take 3 mLs (2.5 mg total) by nebulization every 6 (six) hours as needed for wheezing or shortness of breath. 05/02/18   Norval Morton, MD  Alcohol Swabs (B-D SINGLE USE SWABS REGULAR) PADS  08/10/19   [provider]   allopurinol (ZYLOPRIM) 100 MG tablet TAKE 1 TABLET BY MOUTH  DAILY Patient taking differently: Take 100 mg by mouth daily. 04/23/20   Croitoru, [name], MD  aspirin EC 81 MG tablet Take 1 tablet (81 mg total) by mouth daily. 03/27/16   Erlene Quan, PA-C  bisoprolol (ZEBETA) 10 MG tablet Take 1 tablet (10 mg total) by mouth daily. 01/11/21   [name], NP  budesonide-formoterol [facility]) 160-4.5 MCG/ACT inhaler Inhale 2 puffs into the lungs 2 (two) times daily.    [provider]  carboxymethylcellulose (REFRESH PLUS) 0.5 % SOLN Place 1 drop into both eyes every 12 (twelve) hours.    [provider]  clopidogrel (PLAVIX) 75 MG tablet Take 1 tablet (75 mg total) by mouth daily. Patient taking differently: Take 75 mg by mouth at bedtime. 03/06/20   Croitoru, [name], MD  cyclobenzaprine (FLEXERIL) 10 MG tablet Take 5 mg by mouth 2 (two) times daily as needed for muscle spasms. 12/26/20   [provider]  Docusate Calcium (STOOL SOFTENER PO) Take 1 tablet by mouth at bedtime.    [provider]  empagliflozin (JARDIANCE) 10 MG TABS tablet Take 10 mg by mouth daily before breakfast. 06/30/19   Bensimhon, Shaune Pascal, MD  EPINEPHrine 0.3 mg/0.3 mL IJ SOAJ injection Inject 0.3 mg into the muscle once as needed for anaphylaxis (severe allergic reaction).    [provider]  ferrous sulfate 325 (65 FE) MG tablet Take 325 mg by mouth daily with breakfast.    [provider]  fluticasone (FLONASE) 50 MCG/ACT nasal spray Place 1 spray into both nostrils daily. 03/22/20   [provider]  guaiFENesin (MUCINEX) 600 MG 12 hr tablet Take 1 tablet (600 mg total) by mouth 2 (two) times daily. 01/10/21   [name], NP  ipratropium (ATROVENT) 0.02 % nebulizer solution Take 2.5 mLs (0.5 mg total) by nebulization 4 (four) times daily. Patient taking differently: Take 0.5 mg by nebulization every 4 (four) hours as needed (Asthma). 05/02/18   Fuller Plan  A, MD  ipratropium (ATROVENT) 0.06 % nasal spray Place into both nostrils. 10/24/20   [provider]  isosorbide mononitrate (IMDUR) 30 MG 24 hr tablet Take 90 mg by mouth daily.    [provider]  loratadine (CLARITIN) 10 MG tablet Take 10 mg by mouth daily. 11/23/19   [provider]  nitroGLYCERIN (  NITROSTAT) 0.4 MG SL tablet Place 1 tablet (0.4 mg total) under the tongue every 5 (five) minutes as needed for chest pain. Patient not taking: Reported on 01/21/2021 11/13/16 01/07/21  Erlene Quan, PA-C  nystatin cream (MYCOSTATIN) Apply 1 application topically 2 (two) times daily as needed (rash). 06/14/20   [provider]  OXYGEN Inhale 3 L/min into the lungs continuous.     [provider]  pantoprazole (PROTONIX) 40 MG tablet Take 1 tablet (40 mg total) by mouth 2 (two) times daily. 07/08/19   [name], MD  polyethylene glycol (MIRALAX / GLYCOLAX) 17 g packet Take 17 g by mouth daily.    [provider]  potassium chloride SA (KLOR-CON) 20 MEQ tablet Take 20 mEq by mouth every morning.    [provider]  predniSONE (DELTASONE) 5 MG tablet Take by mouth. 10/09/20   [provider]  ranolazine (RANEXA) 1000 MG SR tablet TAKE 1 TABLET BY MOUTH  TWICE DAILY Patient taking differently: 1,000 mg 2 (two) times daily. 07/12/20   Croitoru, [name], MD  rosuvastatin (CRESTOR) 40 MG tablet Take 1 tablet (40 mg total) by mouth every evening. NEED OV. 09/19/20   [name], [name], MD  sucralfate (CARAFATE) 1 g tablet Take 1 tablet (1 g total) by mouth in the morning, at noon, and at bedtime. As needed Patient taking differently: Take 1 g by mouth at bedtime. 12/05/20   [name], MD  Torsemide 40 MG TABS Take 40 mg by mouth daily. 01/25/21   Darrick Grinder D, NP    Family History Family History  Problem Relation Age of Onset   Breast cancer Mother    Diabetes Mother    Heart disease Maternal Grandmother    Kidney  disease Maternal Grandmother    Diabetes Maternal Grandmother    Glaucoma Maternal Aunt    Heart disease Maternal Aunt    Asthma Sister    Colon cancer Neg Hx    Stomach cancer Neg Hx    Pancreatic cancer Neg Hx     Social History Social History   Tobacco Use   Smoking status: Former    Packs/day: 0.25    Years: 3.00    Pack years: 0.75    Types: Cigarettes    Quit date: 02/11/1968    Years since quitting: 53.0   Smokeless tobacco: Never  Vaping Use   Vaping Use: Never used  Substance Use Topics   Alcohol use: No   Drug use: No     Allergies   Ivp dye [iodinated contrast media], Shellfish allergy, Sulfa antibiotics, Iodine, Atorvastatin, Benadryl [diphenhydramine], Colchicine, Contrast media [iodinated contrast media], Doxycycline, Uloric [febuxostat], Cephalexin, and Zithromax [azithromycin]   Review of Systems Review of Systems Per HPI  Physical Exam Triage Vital Signs ED Triage Vitals [02/13/21 1729]  Enc Vitals Group     BP (!) 159/75     Pulse Rate 65     Resp 18     Temp 98.6 F (37 C)     Temp Source Oral     SpO2 95 %     Weight      Height      Head Circumference      Peak Flow      Pain Score 0     Pain Loc      Pain Edu?      Excl. in [place]?    No data found.  Updated Vital Signs BP (!) 159/75 (BP Location:  Left Arm)    Pulse 65    Temp 98.6 F (37 C) (Oral)    Resp 18    SpO2 95%   Visual Acuity Right Eye Distance:   Left Eye Distance:   Bilateral Distance:    Right Eye Near:   Left Eye Near:    Bilateral Near:     Physical Exam Constitutional:      General: She is not in acute distress.    Appearance: Normal appearance. She is not toxic-appearing or diaphoretic.  HENT:     Head: Normocephalic and atraumatic.  Eyes:     Extraocular Movements: Extraocular movements intact.     Conjunctiva/sclera: Conjunctivae normal.  Pulmonary:     Effort: Pulmonary effort is normal.  Skin:    General: Skin is warm and dry.     Findings:  Wound present.     Comments: Patient has small approximately 1 cm ulcerated lesion that is present to dorsal surface of left second toe.  Also has associated erythema noted to dorsal surface of foot and slightly to anterior left shin.  Pretty significant swelling located to left foot that extends into left lower extremity.  Tenderness to palpation as well.  Capillary refill and pedal pulses normal.  Neurological:     General: No focal deficit present.     Mental Status: She is alert and oriented to person, place, and time. Mental status is at baseline.  Psychiatric:        Mood and Affect: Mood normal.        Behavior: Behavior normal.        Thought Content: Thought content normal.        Judgment: Judgment normal.     UC Treatments / Results  Labs (all labs ordered are listed, but only abnormal results are displayed) Labs Reviewed - No data to display  EKG   Radiology No results found.  Procedures Procedures (including critical care time)  Medications Ordered in UC Medications - No data to display  Initial Impression / Assessment and Plan / UC Course  I have reviewed the triage vital signs and the nursing notes.  Pertinent labs & imaging results that were available during my care of the patient were reviewed by me and considered in my medical decision making (see chart for details).     Patient's physical exam is most consistent with possible cellulitis given appearance of ulcer and erythema noted to left lower extremity.  Although, it appears that patient needs DVT ruled out given appearance of leg on exam.  Low chance of DVT given the patient already takes Plavix.  Unable to perform ultrasound in urgent care today.  Advised patient to go to the hospital for further evaluation and management to rule out more worrisome etiologies.  Patient was agreeable with plan.  Vital signs stable at discharge.  Agree with patient's family member transporting her to the hospital. Final  Clinical Impressions(s) / UC Diagnoses   Final diagnoses:  Edema of left lower leg     Discharge Instructions      Please go to the hospital as soon as you leave urgent care for further evaluation and management.    ED Prescriptions   None    PDMP not reviewed this encounter.   Teodora Medici, [place] 02/13/21 [id number]

## 2021-02-14 ENCOUNTER — Encounter (HOSPITAL_COMMUNITY): Payer: Self-pay

## 2021-02-14 ENCOUNTER — Emergency Department (HOSPITAL_BASED_OUTPATIENT_CLINIC_OR_DEPARTMENT_OTHER): Payer: Medicare Other

## 2021-02-14 DIAGNOSIS — M7989 Other specified soft tissue disorders: Secondary | ICD-10-CM

## 2021-02-14 DIAGNOSIS — Z7689 Persons encountering health services in other specified circumstances: Secondary | ICD-10-CM | POA: Diagnosis not present

## 2021-02-14 DIAGNOSIS — L538 Other specified erythematous conditions: Secondary | ICD-10-CM

## 2021-02-14 DIAGNOSIS — L03116 Cellulitis of left lower limb: Secondary | ICD-10-CM | POA: Diagnosis not present

## 2021-02-14 DIAGNOSIS — M79605 Pain in left leg: Secondary | ICD-10-CM

## 2021-02-14 LAB — CBC WITH DIFFERENTIAL/PLATELET
Abs Immature Granulocytes: 0.03 10*3/uL (ref 0.00–0.07)
Basophils Absolute: 0.1 10*3/uL (ref 0.0–0.1)
Basophils Relative: 1 %
Eosinophils Absolute: 0.2 10*3/uL (ref 0.0–0.5)
Eosinophils Relative: 3 %
HCT: 40.2 % (ref 36.0–46.0)
Hemoglobin: 11.9 g/dL — ABNORMAL LOW (ref 12.0–15.0)
Immature Granulocytes: 0 %
Lymphocytes Relative: 19 %
Lymphs Abs: 1.3 10*3/uL (ref 0.7–4.0)
MCH: 29.3 pg (ref 26.0–34.0)
MCHC: 29.6 g/dL — ABNORMAL LOW (ref 30.0–36.0)
MCV: 99 fL (ref 80.0–100.0)
Monocytes Absolute: 0.5 10*3/uL (ref 0.1–1.0)
Monocytes Relative: 8 %
Neutro Abs: 4.7 10*3/uL (ref 1.7–7.7)
Neutrophils Relative %: 69 %
Platelets: 171 10*3/uL (ref 150–400)
RBC: 4.06 MIL/uL (ref 3.87–5.11)
RDW: 15.4 % (ref 11.5–15.5)
WBC: 6.8 10*3/uL (ref 4.0–10.5)
nRBC: 0 % (ref 0.0–0.2)

## 2021-02-14 LAB — BASIC METABOLIC PANEL
Anion gap: 8 (ref 5–15)
BUN: 24 mg/dL — ABNORMAL HIGH (ref 8–23)
CO2: 35 mmol/L — ABNORMAL HIGH (ref 22–32)
Calcium: 9 mg/dL (ref 8.9–10.3)
Chloride: 96 mmol/L — ABNORMAL LOW (ref 98–111)
Creatinine, Ser: 1.58 mg/dL — ABNORMAL HIGH (ref 0.44–1.00)
GFR, Estimated: 34 mL/min — ABNORMAL LOW (ref >60)
Glucose, Bld: 175 mg/dL — ABNORMAL HIGH (ref 70–99)
Potassium: 3.8 mmol/L (ref 3.5–5.1)
Sodium: 139 mmol/L (ref 135–145)

## 2021-02-14 MED ORDER — AMOXICILLIN-POT CLAVULANATE 875-125 MG PO TABS: 1.0000 | ORAL_TABLET | Freq: Two times a day (BID) | ORAL | 0 refills | Status: DC

## 2021-02-14 MED ORDER — GUAIFENESIN 100 MG/5ML PO LIQD
5.0000 mL | Freq: Once | ORAL | Status: AC
  Administered 2021-02-14: 5 mL via ORAL
  Filled 2021-02-14: qty 10

## 2021-02-14 NOTE — ED Provider Notes (Signed)
[facility] DEPT Provider Note   CSN: [id number] Arrival date & time: 02/13/21  1918     History  Chief Complaint  Patient presents with   Leg Swelling    [name] is a 76 y.o. female.  Patient with history of afib, CHF, and diabetes presents today with complaint of leg foot wound. She states that she had a 'blood blister on her second toe that she first noticed about a week ago, states that 3 days ago her son stepped on her toe and the blister ruptured. The subsequent morning, she woke up and noted unilateral redness and swelling to her left calf with pain. Went to UC for same and was sent here to r/o DVT. Of note, she is on Plavix, denies any missed doses and has no history of DVT or PE. Patient presents with left lower foot, leg, calf swelling with associated redness and pain that started yesterday.  She denies any known injury to the area, no fevers or chills. Of note she is on 3L [place] at baseline. She has not received antibiotici treatment for this wound previously.  The history is provided by the patient. No language interpreter was used.      Home Medications Prior to Admission medications   Medication Sig Start Date End Date Taking? Authorizing Provider  ACCU-CHEK AVIVA PLUS test strip Check blood sugar twice daily 10/19/19   [provider]  Accu-Chek Softclix Lancets lancets Check blood sugar twice daily 08/10/19   [provider]  acetaminophen (TYLENOL) 500 MG tablet Take 1,000 mg by mouth 2 (two) times daily as needed (for pain or headaches).    [provider]  albuterol (PROVENTIL HFA;VENTOLIN HFA) 108 (90 Base) MCG/ACT inhaler Inhale 2 puffs into the lungs every 6 (six) hours as needed for wheezing or shortness of breath. 05/02/18   Norval Morton, MD  albuterol (PROVENTIL) (2.5 MG/3ML) 0.083% nebulizer solution Take 3 mLs (2.5 mg total) by nebulization every 6 (six) hours as needed for wheezing or shortness of  breath. 05/02/18   Norval Morton, MD  Alcohol Swabs (B-D SINGLE USE SWABS REGULAR) PADS  08/10/19   [provider]  allopurinol (ZYLOPRIM) 100 MG tablet TAKE 1 TABLET BY MOUTH  DAILY Patient taking differently: Take 100 mg by mouth daily. 04/23/20   Croitoru, [name], MD  aspirin EC 81 MG tablet Take 1 tablet (81 mg total) by mouth daily. 03/27/16   Erlene Quan, PA-C  bisoprolol (ZEBETA) 10 MG tablet Take 1 tablet (10 mg total) by mouth daily. 01/11/21   [name], NP  budesonide-formoterol [facility]) 160-4.5 MCG/ACT inhaler Inhale 2 puffs into the lungs 2 (two) times daily.    [provider]  carboxymethylcellulose (REFRESH PLUS) 0.5 % SOLN Place 1 drop into both eyes every 12 (twelve) hours.    [provider]  clopidogrel (PLAVIX) 75 MG tablet Take 1 tablet (75 mg total) by mouth daily. Patient taking differently: Take 75 mg by mouth at bedtime. 03/06/20   Croitoru, [name], MD  cyclobenzaprine (FLEXERIL) 10 MG tablet Take 5 mg by mouth 2 (two) times daily as needed for muscle spasms. 12/26/20   [provider]  Docusate Calcium (STOOL SOFTENER PO) Take 1 tablet by mouth at bedtime.    [provider]  empagliflozin (JARDIANCE) 10 MG TABS tablet Take 10 mg by mouth daily before breakfast. 06/30/19   Bensimhon, Shaune Pascal, MD  EPINEPHrine 0.3 mg/0.3 mL IJ SOAJ injection Inject  0.3 mg into the muscle once as needed for anaphylaxis (severe allergic reaction).    [provider]  ferrous sulfate 325 (65 FE) MG tablet Take 325 mg by mouth daily with breakfast.    [provider]  fluticasone (FLONASE) 50 MCG/ACT nasal spray Place 1 spray into both nostrils daily. 03/22/20   [provider]  guaiFENesin (MUCINEX) 600 MG 12 hr tablet Take 1 tablet (600 mg total) by mouth 2 (two) times daily. 01/10/21   [name], NP  ipratropium (ATROVENT) 0.02 % nebulizer solution Take 2.5 mLs (0.5 mg total) by nebulization 4 (four) times  daily. Patient taking differently: Take 0.5 mg by nebulization every 4 (four) hours as needed (Asthma). 05/02/18   Fuller Plan A, MD  ipratropium (ATROVENT) 0.06 % nasal spray Place into both nostrils. 10/24/20   [provider]  isosorbide mononitrate (IMDUR) 30 MG 24 hr tablet Take 90 mg by mouth daily.    [provider]  loratadine (CLARITIN) 10 MG tablet Take 10 mg by mouth daily. 11/23/19   [provider]  nitroGLYCERIN (NITROSTAT) 0.4 MG SL tablet Place 1 tablet (0.4 mg total) under the tongue every 5 (five) minutes as needed for chest pain. Patient not taking: Reported on 01/21/2021 11/13/16 01/07/21  Erlene Quan, PA-C  nystatin cream (MYCOSTATIN) Apply 1 application topically 2 (two) times daily as needed (rash). 06/14/20   [provider]  OXYGEN Inhale 3 L/min into the lungs continuous.     [provider]  pantoprazole (PROTONIX) 40 MG tablet Take 1 tablet (40 mg total) by mouth 2 (two) times daily. 07/08/19   [name], MD  polyethylene glycol (MIRALAX / GLYCOLAX) 17 g packet Take 17 g by mouth daily.    [provider]  potassium chloride SA (KLOR-CON) 20 MEQ tablet Take 20 mEq by mouth every morning.    [provider]  predniSONE (DELTASONE) 5 MG tablet Take by mouth. 10/09/20   [provider]  ranolazine (RANEXA) 1000 MG SR tablet TAKE 1 TABLET BY MOUTH  TWICE DAILY Patient taking differently: 1,000 mg 2 (two) times daily. 07/12/20   Croitoru, [name], MD  rosuvastatin (CRESTOR) 40 MG tablet Take 1 tablet (40 mg total) by mouth every evening. NEED OV. 09/19/20   [name], [name], MD  sucralfate (CARAFATE) 1 g tablet Take 1 tablet (1 g total) by mouth in the morning, at noon, and at bedtime. As needed Patient taking differently: Take 1 g by mouth at bedtime. 12/05/20   [name], MD  Torsemide 40 MG TABS Take 40 mg by mouth daily. 01/25/21   Clegg, Amy D, NP      Allergies    Ivp dye  [iodinated contrast media], Shellfish allergy, Sulfa antibiotics, Iodine, Atorvastatin, Benadryl [diphenhydramine], Colchicine, Contrast media [iodinated contrast media], Doxycycline, Uloric [febuxostat], Cephalexin, and Zithromax [azithromycin]    Review of Systems   Review of Systems  Constitutional:  Negative for chills and fever.  Skin:  Positive for wound.  All other systems reviewed and are negative.  Physical Exam Updated Vital Signs BP (!) 153/68    Pulse 65    Temp 98 F (36.7 C) (Oral)    Resp 19    Ht 5\' 3"  (1.6 m)    Wt 103.9 kg    SpO2 100%    BMI 40.57 kg/m  Physical Exam Vitals and nursing note reviewed.  Constitutional:      Appearance: Normal appearance. She is obese.  Comments: Patient resting comfortably in bed in no acute distress  Cardiovascular:     Rate and Rhythm: Normal rate.  Pulmonary:     Effort: Pulmonary effort is normal. No respiratory distress.  Abdominal:     General: Abdomen is flat.     Palpations: Abdomen is soft.  Musculoskeletal:     Left lower leg: Edema present.     Comments: 1 cm ulcerated lesion present to dorsal surface of left second toe with associated erythema noted to dorsal surface of foot and halfway up the anterior left shin.  2+ edema located to left foot that extends into left lower extremity with tenderness to palpation.  Capillary refill less than 2 seconds and DP and PT pulses intact and 2+. No posterior calf tenderness or palpable cord. Bottom of foot is normal appearing without wound or erythema  Neurological:     Mental Status: She is alert.    ED Results / Procedures / Treatments   Labs (all labs ordered are listed, but only abnormal results are displayed) Labs Reviewed  BASIC METABOLIC PANEL - Abnormal; Notable for the following components:      Result Value   Chloride 96 (*)    CO2 35 (*)    Glucose, Bld 175 (*)    BUN 24 (*)    Creatinine, Ser 1.58 (*)    GFR, Estimated 34 (*)    All other components within  normal limits  CBC WITH DIFFERENTIAL/PLATELET - Abnormal; Notable for the following components:   Hemoglobin 11.9 (*)    MCHC 29.6 (*)    All other components within normal limits    EKG None  Radiology DG Foot Complete Left  Result Date: 02/13/2021 CLINICAL DATA:  Foot pain.  Pain and swelling.  Ulcer on second toe. EXAM: LEFT FOOT - COMPLETE 3+ VIEW COMPARISON:  Foot radiograph 09/30/2016 FINDINGS: Bones diffusely under mineralized. Hammertoe deformity of the digits. No fracture. No erosion, bony destruction, or periosteal reaction. Chronic deformity of the third toe proximal phalanx. Moderate midfoot degenerative change. Moderate plantar calcaneal spur. Posterior aspect of the calcaneus is not included in the field of view. Prominent soft tissue edema over the dorsum of the foot. No soft tissue air or radiopaque foreign body. IMPRESSION: 1. Prominent soft tissue edema over the dorsum of the foot. No soft tissue air or radiopaque foreign body. No radiographic evidence of osteomyelitis. 2. Moderate midfoot degenerative change. 3. Moderate plantar calcaneal spur. Electronically Signed   By: Keith Rake M.D.   On: 02/13/2021 20:58    Procedures Procedures    Medications Ordered in ED Medications - No data to display  ED Course/ Medical Decision Making/ A&P                           Medical Decision Making  This patient presents to the ED for concern of left lower leg wound in diabetic patient, this involves an extensive number of treatment options, and is a complaint that carries with it a high risk of complications and morbidity.  The differential diagnosis includes DVT, diabetic cellulitis   Co morbidities that complicate the patient evaluation  Diabetes, CHF    Lab Tests:  I Ordered, and personally interpreted labs.  The pertinent results include:  Creatinine 1.58, GFR 34 per baseline. Anemia at 11.9 per baseline. No leukocytosis.   Imaging Studies ordered:  I ordered  imaging studies including DG left foot, LLE DVT US  I  independently visualized and interpreted imaging which showed:  Prominent soft tissue edema over the dorsum of the foot. No soft tissue air or radiopaque foreign body. No radiographic evidence of osteomyelitis. [place] without evidence of DVT I agree with the radiologist interpretation     Consultations Obtained:  I requested consultation with the pharmacist for antibiotic recommendations for outpatient diabetic cellulitis management with her several antibiotic allergies, discussed lab and imaging findings as well as pertinent plan - they recommend: Augmentin 875-125 BID for 5-10 days   Dispostion:  After consideration of the diagnostic results and the patients response to treatment, I feel that the patent would benefit from outpatient oral antibiotic therapy for cellulitis.  Her wound is without purulent drainage and appears to be healing.  No area of fluctuance or induration to indicate abscess or other abnormality.  She is afebrile, nontoxic-appearing, and in no acute distress with reassuring vital signs.  Therefore, I do not think that she needs IV antibiotics at this time.  Patient understanding and amenable with plan, educated on red flag symptoms that would prompt immediate return.  Educated to closely monitor the progression of her cellulitis and to have close follow-up with her PCP for continued management.  Discharged in stable condition.   This is a shared visit with supervising physician Dr. Ralene Bathe who has independently evaluated patient & provided guidance in evaluation/management/disposition, in agreement with care    Final Clinical Impression(s) / ED Diagnoses Final diagnoses:  Cellulitis of left lower extremity    Rx / DC Orders ED Discharge Orders          Ordered    amoxicillin-clavulanate (AUGMENTIN) 875-125 MG tablet  Every 12 hours        02/14/21 0905          An After Visit Summary was printed and given to the  patient.     Nestor Lewandowsky 02/14/21 1059    Lacretia Leigh, MD 02/18/21 1019

## 2021-02-14 NOTE — Discharge Instructions (Addendum)
Your workup in the ER today was reassuring. Feel that you have cellulitis which is a soft tissue infection.  I have discussed with pharmacy here who feels that Augmentin is the best antibiotic for this infection given your antibiotic allergies.  Please take this as prescribed in its entirety.  It is also very important that you follow-up with your primary care doctor for continued evaluation and management.  Return if development of fevers, chills, yellow discharge from wound, or any other new or worsening symptoms.

## 2021-02-14 NOTE — Progress Notes (Signed)
LLE venous duplex has been completed.  Preliminary results messaged to Lavonna Rua, PA through secure chat.  Results can be found under chart review under CV PROC. 02/14/2021 9:43 AM Bria Portales RVT, RDMS

## 2021-02-16 DIAGNOSIS — Z7689 Persons encountering health services in other specified circumstances: Secondary | ICD-10-CM | POA: Diagnosis not present

## 2021-02-19 DIAGNOSIS — K5900 Constipation, unspecified: Secondary | ICD-10-CM | POA: Diagnosis not present

## 2021-02-19 DIAGNOSIS — Z7902 Long term (current) use of antithrombotics/antiplatelets: Secondary | ICD-10-CM | POA: Diagnosis not present

## 2021-02-19 DIAGNOSIS — E1122 Type 2 diabetes mellitus with diabetic chronic kidney disease: Secondary | ICD-10-CM | POA: Diagnosis not present

## 2021-02-19 DIAGNOSIS — N183 Chronic kidney disease, stage 3 unspecified: Secondary | ICD-10-CM | POA: Diagnosis not present

## 2021-02-19 DIAGNOSIS — G4733 Obstructive sleep apnea (adult) (pediatric): Secondary | ICD-10-CM | POA: Diagnosis not present

## 2021-02-19 DIAGNOSIS — Z7689 Persons encountering health services in other specified circumstances: Secondary | ICD-10-CM | POA: Diagnosis not present

## 2021-02-19 DIAGNOSIS — I10 Essential (primary) hypertension: Secondary | ICD-10-CM | POA: Diagnosis not present

## 2021-02-19 DIAGNOSIS — I5032 Chronic diastolic (congestive) heart failure: Secondary | ICD-10-CM | POA: Diagnosis not present

## 2021-02-19 DIAGNOSIS — I5031 Acute diastolic (congestive) heart failure: Secondary | ICD-10-CM | POA: Diagnosis not present

## 2021-02-19 DIAGNOSIS — M109 Gout, unspecified: Secondary | ICD-10-CM | POA: Diagnosis not present

## 2021-02-19 DIAGNOSIS — D649 Anemia, unspecified: Secondary | ICD-10-CM | POA: Diagnosis not present

## 2021-02-19 NOTE — Progress Notes (Signed)
Reviewed and agree with documentation and assessment and plan. K. Veena Zavian Slowey , MD   

## 2021-02-21 DIAGNOSIS — I5032 Chronic diastolic (congestive) heart failure: Secondary | ICD-10-CM | POA: Diagnosis not present

## 2021-02-21 DIAGNOSIS — D649 Anemia, unspecified: Secondary | ICD-10-CM | POA: Diagnosis not present

## 2021-02-21 DIAGNOSIS — G4733 Obstructive sleep apnea (adult) (pediatric): Secondary | ICD-10-CM | POA: Diagnosis not present

## 2021-02-21 DIAGNOSIS — N183 Chronic kidney disease, stage 3 unspecified: Secondary | ICD-10-CM | POA: Diagnosis not present

## 2021-02-21 DIAGNOSIS — M109 Gout, unspecified: Secondary | ICD-10-CM | POA: Diagnosis not present

## 2021-02-21 DIAGNOSIS — Z7689 Persons encountering health services in other specified circumstances: Secondary | ICD-10-CM | POA: Diagnosis not present

## 2021-02-21 DIAGNOSIS — Z7902 Long term (current) use of antithrombotics/antiplatelets: Secondary | ICD-10-CM | POA: Diagnosis not present

## 2021-02-21 DIAGNOSIS — I5031 Acute diastolic (congestive) heart failure: Secondary | ICD-10-CM | POA: Diagnosis not present

## 2021-02-21 DIAGNOSIS — K5900 Constipation, unspecified: Secondary | ICD-10-CM | POA: Diagnosis not present

## 2021-02-21 DIAGNOSIS — E1122 Type 2 diabetes mellitus with diabetic chronic kidney disease: Secondary | ICD-10-CM | POA: Diagnosis not present

## 2021-02-21 DIAGNOSIS — I10 Essential (primary) hypertension: Secondary | ICD-10-CM | POA: Diagnosis not present

## 2021-02-22 ENCOUNTER — Telehealth: Payer: Self-pay | Admitting: Cardiovascular Disease

## 2021-02-22 NOTE — Telephone Encounter (Signed)
Returned the call to the patient. She stated that she has been having left leg swelling. She went to [facility] concerning this and was given antibiotics for possible cellulitis.   Her PCP is giving her a second round since she has not had much improvement. She will start the second round tomorrow. Her PCP advised that she call her cardiologist concerning this.  She denies any shortness of breath outside of her norm. The swelling is only in her left leg. She stated that she can feel a pulse and it is normal. It is red and warm. A nurse will be coming out to her house next week to assess it and wrap it.   She has been advised that if it worsens then she may need to go to an urgent care to have it assessed.

## 2021-02-22 NOTE — Telephone Encounter (Signed)
Pt c/o swelling: STAT is pt has developed SOB within 24 hours  If swelling, where is the swelling located? Left leg  How much weight have you gained and in what time span? Has not gained weight  Have you gained 3 pounds in a day or 5 pounds in a week? no  Do you have a log of your daily weights (if so, list)? no  Are you currently taking a fluid pill? yes  Are you currently SOB? Not now, but does get SOB sometimes  Have you traveled recently? No  Patient states she went to the hospital for the swelling in her leg and was told it is cellulitis. She has her PCP told her to contact her cardiologist.

## 2021-02-23 ENCOUNTER — Other Ambulatory Visit: Payer: Self-pay | Admitting: Cardiovascular Disease

## 2021-02-23 DIAGNOSIS — Z7689 Persons encountering health services in other specified circumstances: Secondary | ICD-10-CM | POA: Diagnosis not present

## 2021-02-26 DIAGNOSIS — I10 Essential (primary) hypertension: Secondary | ICD-10-CM | POA: Diagnosis not present

## 2021-02-26 DIAGNOSIS — N183 Chronic kidney disease, stage 3 unspecified: Secondary | ICD-10-CM | POA: Diagnosis not present

## 2021-02-26 DIAGNOSIS — I5031 Acute diastolic (congestive) heart failure: Secondary | ICD-10-CM | POA: Diagnosis not present

## 2021-02-26 DIAGNOSIS — Z7902 Long term (current) use of antithrombotics/antiplatelets: Secondary | ICD-10-CM | POA: Diagnosis not present

## 2021-02-26 DIAGNOSIS — I5032 Chronic diastolic (congestive) heart failure: Secondary | ICD-10-CM | POA: Diagnosis not present

## 2021-02-26 DIAGNOSIS — G4733 Obstructive sleep apnea (adult) (pediatric): Secondary | ICD-10-CM | POA: Diagnosis not present

## 2021-02-26 DIAGNOSIS — K5900 Constipation, unspecified: Secondary | ICD-10-CM | POA: Diagnosis not present

## 2021-02-26 DIAGNOSIS — M109 Gout, unspecified: Secondary | ICD-10-CM | POA: Diagnosis not present

## 2021-02-26 DIAGNOSIS — D649 Anemia, unspecified: Secondary | ICD-10-CM | POA: Diagnosis not present

## 2021-02-26 DIAGNOSIS — E1122 Type 2 diabetes mellitus with diabetic chronic kidney disease: Secondary | ICD-10-CM | POA: Diagnosis not present

## 2021-02-26 DIAGNOSIS — Z7689 Persons encountering health services in other specified circumstances: Secondary | ICD-10-CM | POA: Diagnosis not present

## 2021-02-27 ENCOUNTER — Telehealth: Payer: Self-pay | Admitting: Cardiovascular Disease

## 2021-02-27 MED ORDER — CLOPIDOGREL BISULFATE 75 MG PO TABS: 75.0000 mg | ORAL_TABLET | Freq: Every day | ORAL | 3 refills | Status: DC

## 2021-02-27 NOTE — Telephone Encounter (Signed)
Refills sent to [facility].

## 2021-02-27 NOTE — Telephone Encounter (Signed)
°*  STAT* If patient is at the pharmacy, call can be transferred to refill team.   1. Which medications need to be refilled? (please list name of each medication and dose if known) new prescription for Plavix  2. Which pharmacy/location (including street and city if local pharmacy) is medication to be sent to?[place], [place]  . Do they need a 30 day or 90 day supply? 90 days and refills

## 2021-02-28 ENCOUNTER — Telehealth: Payer: Self-pay | Admitting: Cardiovascular Disease

## 2021-02-28 DIAGNOSIS — Z7689 Persons encountering health services in other specified circumstances: Secondary | ICD-10-CM | POA: Diagnosis not present

## 2021-02-28 MED ORDER — CLOPIDOGREL BISULFATE 75 MG PO TABS: 75.0000 mg | ORAL_TABLET | Freq: Every day | ORAL | 3 refills | Status: DC

## 2021-02-28 NOTE — Telephone Encounter (Signed)
Spoke with pt regarding plavix prescription. Pt states that she called yesterday and was told that the prescription was sent in. Pt states she checked with her pharmacy and they told her that they didn't receive the request. Re-sent prescription in to pt's pharmacy of choice. Pt verbalizes understanding.

## 2021-02-28 NOTE — Telephone Encounter (Signed)
Patient wants to nurse. Wouldn't go into further detail.

## 2021-03-02 DIAGNOSIS — Z7689 Persons encountering health services in other specified circumstances: Secondary | ICD-10-CM | POA: Diagnosis not present

## 2021-03-05 DIAGNOSIS — I10 Essential (primary) hypertension: Secondary | ICD-10-CM | POA: Diagnosis not present

## 2021-03-05 DIAGNOSIS — D649 Anemia, unspecified: Secondary | ICD-10-CM | POA: Diagnosis not present

## 2021-03-05 DIAGNOSIS — I5032 Chronic diastolic (congestive) heart failure: Secondary | ICD-10-CM | POA: Diagnosis not present

## 2021-03-05 DIAGNOSIS — E1122 Type 2 diabetes mellitus with diabetic chronic kidney disease: Secondary | ICD-10-CM | POA: Diagnosis not present

## 2021-03-05 DIAGNOSIS — N183 Chronic kidney disease, stage 3 unspecified: Secondary | ICD-10-CM | POA: Diagnosis not present

## 2021-03-05 DIAGNOSIS — I5031 Acute diastolic (congestive) heart failure: Secondary | ICD-10-CM | POA: Diagnosis not present

## 2021-03-05 DIAGNOSIS — K5900 Constipation, unspecified: Secondary | ICD-10-CM | POA: Diagnosis not present

## 2021-03-05 DIAGNOSIS — G4733 Obstructive sleep apnea (adult) (pediatric): Secondary | ICD-10-CM | POA: Diagnosis not present

## 2021-03-05 DIAGNOSIS — Z7902 Long term (current) use of antithrombotics/antiplatelets: Secondary | ICD-10-CM | POA: Diagnosis not present

## 2021-03-05 DIAGNOSIS — Z7689 Persons encountering health services in other specified circumstances: Secondary | ICD-10-CM | POA: Diagnosis not present

## 2021-03-05 DIAGNOSIS — M109 Gout, unspecified: Secondary | ICD-10-CM | POA: Diagnosis not present

## 2021-03-07 DIAGNOSIS — E1122 Type 2 diabetes mellitus with diabetic chronic kidney disease: Secondary | ICD-10-CM | POA: Diagnosis not present

## 2021-03-07 DIAGNOSIS — I5031 Acute diastolic (congestive) heart failure: Secondary | ICD-10-CM | POA: Diagnosis not present

## 2021-03-07 DIAGNOSIS — I10 Essential (primary) hypertension: Secondary | ICD-10-CM | POA: Diagnosis not present

## 2021-03-07 DIAGNOSIS — G4733 Obstructive sleep apnea (adult) (pediatric): Secondary | ICD-10-CM | POA: Diagnosis not present

## 2021-03-07 DIAGNOSIS — I5032 Chronic diastolic (congestive) heart failure: Secondary | ICD-10-CM | POA: Diagnosis not present

## 2021-03-07 DIAGNOSIS — Z7689 Persons encountering health services in other specified circumstances: Secondary | ICD-10-CM | POA: Diagnosis not present

## 2021-03-07 DIAGNOSIS — M109 Gout, unspecified: Secondary | ICD-10-CM | POA: Diagnosis not present

## 2021-03-07 DIAGNOSIS — N183 Chronic kidney disease, stage 3 unspecified: Secondary | ICD-10-CM | POA: Diagnosis not present

## 2021-03-07 DIAGNOSIS — K5900 Constipation, unspecified: Secondary | ICD-10-CM | POA: Diagnosis not present

## 2021-03-07 DIAGNOSIS — Z7902 Long term (current) use of antithrombotics/antiplatelets: Secondary | ICD-10-CM | POA: Diagnosis not present

## 2021-03-07 DIAGNOSIS — D649 Anemia, unspecified: Secondary | ICD-10-CM | POA: Diagnosis not present

## 2021-03-08 DIAGNOSIS — I5022 Chronic systolic (congestive) heart failure: Secondary | ICD-10-CM | POA: Diagnosis not present

## 2021-03-08 DIAGNOSIS — J9611 Chronic respiratory failure with hypoxia: Secondary | ICD-10-CM | POA: Diagnosis not present

## 2021-03-08 DIAGNOSIS — J9612 Chronic respiratory failure with hypercapnia: Secondary | ICD-10-CM | POA: Diagnosis not present

## 2021-03-09 DIAGNOSIS — Z7689 Persons encountering health services in other specified circumstances: Secondary | ICD-10-CM | POA: Diagnosis not present

## 2021-03-12 DIAGNOSIS — Z7689 Persons encountering health services in other specified circumstances: Secondary | ICD-10-CM | POA: Diagnosis not present

## 2021-03-12 DIAGNOSIS — G4733 Obstructive sleep apnea (adult) (pediatric): Secondary | ICD-10-CM | POA: Diagnosis not present

## 2021-03-12 DIAGNOSIS — K5900 Constipation, unspecified: Secondary | ICD-10-CM | POA: Diagnosis not present

## 2021-03-12 DIAGNOSIS — I5032 Chronic diastolic (congestive) heart failure: Secondary | ICD-10-CM | POA: Diagnosis not present

## 2021-03-12 DIAGNOSIS — M109 Gout, unspecified: Secondary | ICD-10-CM | POA: Diagnosis not present

## 2021-03-12 DIAGNOSIS — I5031 Acute diastolic (congestive) heart failure: Secondary | ICD-10-CM | POA: Diagnosis not present

## 2021-03-12 DIAGNOSIS — D649 Anemia, unspecified: Secondary | ICD-10-CM | POA: Diagnosis not present

## 2021-03-12 DIAGNOSIS — I10 Essential (primary) hypertension: Secondary | ICD-10-CM | POA: Diagnosis not present

## 2021-03-12 DIAGNOSIS — N183 Chronic kidney disease, stage 3 unspecified: Secondary | ICD-10-CM | POA: Diagnosis not present

## 2021-03-12 DIAGNOSIS — E1122 Type 2 diabetes mellitus with diabetic chronic kidney disease: Secondary | ICD-10-CM | POA: Diagnosis not present

## 2021-03-12 DIAGNOSIS — Z7902 Long term (current) use of antithrombotics/antiplatelets: Secondary | ICD-10-CM | POA: Diagnosis not present

## 2021-03-13 ENCOUNTER — Other Ambulatory Visit: Payer: Self-pay | Admitting: Cardiovascular Disease

## 2021-03-13 DIAGNOSIS — I5031 Acute diastolic (congestive) heart failure: Secondary | ICD-10-CM | POA: Diagnosis not present

## 2021-03-13 DIAGNOSIS — Z7902 Long term (current) use of antithrombotics/antiplatelets: Secondary | ICD-10-CM | POA: Diagnosis not present

## 2021-03-13 DIAGNOSIS — I10 Essential (primary) hypertension: Secondary | ICD-10-CM | POA: Diagnosis not present

## 2021-03-13 DIAGNOSIS — K5900 Constipation, unspecified: Secondary | ICD-10-CM | POA: Diagnosis not present

## 2021-03-13 DIAGNOSIS — D649 Anemia, unspecified: Secondary | ICD-10-CM | POA: Diagnosis not present

## 2021-03-13 DIAGNOSIS — M109 Gout, unspecified: Secondary | ICD-10-CM | POA: Diagnosis not present

## 2021-03-13 DIAGNOSIS — N183 Chronic kidney disease, stage 3 unspecified: Secondary | ICD-10-CM | POA: Diagnosis not present

## 2021-03-13 DIAGNOSIS — I5032 Chronic diastolic (congestive) heart failure: Secondary | ICD-10-CM | POA: Diagnosis not present

## 2021-03-13 DIAGNOSIS — G4733 Obstructive sleep apnea (adult) (pediatric): Secondary | ICD-10-CM | POA: Diagnosis not present

## 2021-03-13 DIAGNOSIS — E1122 Type 2 diabetes mellitus with diabetic chronic kidney disease: Secondary | ICD-10-CM | POA: Diagnosis not present

## 2021-03-14 DIAGNOSIS — Z7689 Persons encountering health services in other specified circumstances: Secondary | ICD-10-CM | POA: Diagnosis not present

## 2021-03-14 NOTE — Telephone Encounter (Signed)
OK to refill for 1 year please

## 2021-03-15 ENCOUNTER — Telehealth (HOSPITAL_COMMUNITY): Payer: Self-pay

## 2021-03-15 DIAGNOSIS — D649 Anemia, unspecified: Secondary | ICD-10-CM | POA: Diagnosis not present

## 2021-03-15 DIAGNOSIS — E785 Hyperlipidemia, unspecified: Secondary | ICD-10-CM | POA: Diagnosis not present

## 2021-03-15 DIAGNOSIS — G4733 Obstructive sleep apnea (adult) (pediatric): Secondary | ICD-10-CM | POA: Diagnosis not present

## 2021-03-15 DIAGNOSIS — I5031 Acute diastolic (congestive) heart failure: Secondary | ICD-10-CM | POA: Diagnosis not present

## 2021-03-15 DIAGNOSIS — K5900 Constipation, unspecified: Secondary | ICD-10-CM | POA: Diagnosis not present

## 2021-03-15 DIAGNOSIS — I10 Essential (primary) hypertension: Secondary | ICD-10-CM | POA: Diagnosis not present

## 2021-03-15 DIAGNOSIS — N183 Chronic kidney disease, stage 3 unspecified: Secondary | ICD-10-CM | POA: Diagnosis not present

## 2021-03-15 DIAGNOSIS — M109 Gout, unspecified: Secondary | ICD-10-CM | POA: Diagnosis not present

## 2021-03-15 DIAGNOSIS — E1122 Type 2 diabetes mellitus with diabetic chronic kidney disease: Secondary | ICD-10-CM | POA: Diagnosis not present

## 2021-03-15 DIAGNOSIS — Z9981 Dependence on supplemental oxygen: Secondary | ICD-10-CM | POA: Diagnosis not present

## 2021-03-15 DIAGNOSIS — I5032 Chronic diastolic (congestive) heart failure: Secondary | ICD-10-CM | POA: Diagnosis not present

## 2021-03-15 DIAGNOSIS — Z7902 Long term (current) use of antithrombotics/antiplatelets: Secondary | ICD-10-CM | POA: Diagnosis not present

## 2021-03-15 NOTE — Progress Notes (Addendum)
ADVANCED HF CLINIC NOTE  Patient ID: [name], female   DOB: [birth year], 76 y.o.   MRN: [id number]  Primary Cardiologist: Dr. Sanda Klein Nephrology: Dr. Moshe Cipro Primary HF: Dr. Haroldine Laws   HPI: [name] is a 76 y.o. with history of morbid obesity, DM, HTN, asthma, OSA, ICD, GERD, arthritis, CRT-D 2008 [employer], CAD, S/P CABG x5 11/29/2014.  Discharged from [facility] 12/07/14 s/p CABG. Discharge weight 252.  Went to [facility].  Readmitted 12/21/14 with RLE cellulitis and volume overload with weight of 273 and complaints of dyspnea and orthopnea. RLE cellulitis treated with IV ABX. She was had brisk diuresis with lasix gtt. Diuresis slowed down with slight Cr bump. Acetazolamide added and monitored closely with history of sulfa allergy. Diuresed well, but lasix held with continued Cr bump. Transitioned to po torsemide for d/c. Discharge weight 246 lbs.  Admitted 1/23 through 03/09/16 with CP. CEs negative.   Had CTR-P gen change out 12/31/20  Admitted 01/07/21 with chest pain and A/C HFpEF. HS Trop 20>17. Diuresed with IV lasix and transitioned to torsemide 40 mg daily. Bisoprolol was increased to 10 mg daily. Discharge weight 226 pounds.   On 01/16/21 she called Dr Orene Desanctis and reported 4 pound weight gain. She was instructed to take 60 mg daily until weight went back down. Says she took the higher dose of torsemide for 2 days.   Last seen 12/22 for post hospital follow up, volume up and torsemide increased to 60 daily.   Seen in ED 02/14/21 with LE swelling, diagnosed with cellulitis. [place] of LLE negative for DVT.  Today she returns for HF follow up. Overall feeling fine. Breathing is OK but she does get SOB with house chores. She remains on 3L oxygen at home. Denies palpitations, abnormal bleeding, CP, dizziness, edema, or PND/Orthopnea. Appetite ok. No fever or chills. Weight at home 231 pounds. Taking all medications. Has spells where she feels like the blood is  rushing from her chest down to her legs and it scares her. She feels "empty" when this happens. She attributes this to dehydration and drinks a Gatorade and feels much better. Her blood sugars have been "up and down". She uses transportation through [employer] and lives with her son. Just finished abx course for cellulitis and feels she has a yeast infection.   Cardiac Studies: - Echo (8/22): EF 55-60%, grade I DD, RV ok  - Cath 10/09/16 1. Severe 3v CAD 2. LIMA  to LAD widely patent 3. SVG to Diagonal widely patent 4. SVG to RCA widely patent 5. SVG to LCx and OM occluded ostially.  Underwent to stent to LCX and Ramus 6. LVEF 60% 7. Mild PAH with normal PCWP and output  - Underwent PCI 10/31/16:  -Successful PTCA/DES x 3 AV groove circumflex -Successful PTCA/DES x 1 intermediate branch   Findings:   Ao = 117/61 (83) LV = 126/8 RA = 11 RV = 39/10 PA = 37/16 (25) PCW = 13 Fick cardiac output/index = 5.1/2.3 PVR = 2.4 WU FA sat = 95% PA sat = 65%   Assessment: 1. Severe 3v CAD 2. LIMA  to LAD widely patent 3. SVG to Diagonal widely patent 4. SVG to RCA widely patent 5. SVG to LCx and OM occluded ostially 6. LVEF 60% 7. Mild PAH with normal PCWP and output    - Echo 11/24/14 EF 55-60%, Grade 1 DD. Mild TR - TEE 11/29/14 EF 55-60%, LVH, Mild AR and MR - Echo 9/17: EF 55-60%  LVH  Labs 02/21/2015: K 4.5 Creatinine 1.39  Labs 07/12/2015: K 4.4 Creatinine 1.14 Hgb 11.5   Past Medical History:  Diagnosis Date   ABDOMINAL PAIN -GENERALIZED 02/21/2010   Qualifier: Diagnosis of  By: Chester Holstein NP, Nevin Bloodgood     AICD (automatic cardioverter/defibrillator) present    downgraded to CRT-P in 2014   Anemia    Arthritis    Asthma    Atrial fibrillation ([place]) 10/24/2015   Questionable history of A Fib/A Flutter. On device interrogation on 9/7, showed 0.54min of A Fib/A Flutter with 1% burden.  Device check in Jan 2018 showed no sustained AF (runs of less than 30 seconds). No  anticoagulation indicated.   CAD (coronary artery disease)    a. 10/09/16 LHC: SVG->LAD patent, SVG->Diag patent, SVG->RCA patent, SVG->LCx occluded. EF 60%, b. 10/31/16 LHC DES to AV groove Circ, DES to intermed branch   Cellulitis and abscess of foot 12/2014   RT FOOT   CHF (congestive heart failure) (HCC)    Chronic bronchitis (HCC)    Chronic lower back pain    Complete heart block ([place]) 06/22/2015   Diverticulosis    Facial numbness 02/2016   Fatty liver disease, nonalcoholic    Gastritis    GERD (gastroesophageal reflux disease)    Gout    H/O hiatal hernia    HEMORRHOIDS-EXTERNAL 02/21/2010   Qualifier: Diagnosis of  By: Chester Holstein NP, Paula     High cholesterol    Hyperplastic colon polyp    Hypertension    IBS (irritable bowel syndrome)    Internal hemorrhoids    INTERNAL HEMORRHOIDS WITHOUT MENTION COMP 04/12/2007   Qualifier: Diagnosis of  By: Olevia Perches MD, Dora M    Nonischemic cardiomyopathy ([place]) 11/22/2011   Pt responded to BiV ICD- last EF 55-60% Sept 2017   Obesity    On home oxygen therapy    "2L at night" (10/31/2016)   OSA (obstructive sleep apnea)    "can't tolerate a mask" (10/30/2016)   PERSONAL HX COLONIC POLYPS 02/21/2010   Qualifier: Diagnosis of  By: Chester Holstein NP, Paula     Pneumonia    "couple times" (10/31/2016)   Presence of permanent cardiac pacemaker    11/02/12 [employer] V273 INTUA PPM   Right facial numbness 03/05/2016   Shortness of breath    Stroke ([place]) 09/2020   TIA (transient ischemic attack)    "recently" (10/31/2016)   Type II diabetes mellitus (HCC)    Current Outpatient Medications  Medication Sig Dispense Refill   ACCU-CHEK AVIVA PLUS test strip Check blood sugar twice daily     Accu-Chek Softclix Lancets lancets Check blood sugar twice daily     acetaminophen (TYLENOL) 500 MG tablet Take 1,000 mg by mouth 2 (two) times daily as needed (for pain or headaches).     albuterol (PROVENTIL HFA;VENTOLIN HFA) 108 (90 Base) MCG/ACT  inhaler Inhale 2 puffs into the lungs every 6 (six) hours as needed for wheezing or shortness of breath. 1 Inhaler 0   albuterol (PROVENTIL) (2.5 MG/3ML) 0.083% nebulizer solution Take 3 mLs (2.5 mg total) by nebulization every 6 (six) hours as needed for wheezing or shortness of breath. 75 mL 0   Alcohol Swabs (B-D SINGLE USE SWABS REGULAR) PADS      allopurinol (ZYLOPRIM) 100 MG tablet Take 1 tablet (100 mg total) by mouth daily. 90 tablet 3   aspirin EC 81 MG tablet Take 1 tablet (81 mg total) by mouth daily. 90 tablet 3   bisoprolol (  ZEBETA) 10 MG tablet Take 1 tablet (10 mg total) by mouth daily. 30 tablet 6   budesonide-formoterol (SYMBICORT) 160-4.5 MCG/ACT inhaler Inhale 2 puffs into the lungs 2 (two) times daily.     carboxymethylcellulose (REFRESH PLUS) 0.5 % SOLN Place 1 drop into both eyes every 12 (twelve) hours.     clopidogrel (PLAVIX) 75 MG tablet Take 1 tablet (75 mg total) by mouth daily. 90 tablet 3   cyclobenzaprine (FLEXERIL) 10 MG tablet Take 5 mg by mouth 2 (two) times daily as needed for muscle spasms.     Docusate Calcium (STOOL SOFTENER PO) Take 1 tablet by mouth at bedtime.     empagliflozin (JARDIANCE) 10 MG TABS tablet Take 10 mg by mouth daily before breakfast. 90 tablet 3   EPINEPHrine 0.3 mg/0.3 mL IJ SOAJ injection Inject 0.3 mg into the muscle once as needed for anaphylaxis (severe allergic reaction).     ferrous sulfate 325 (65 FE) MG tablet Take 325 mg by mouth daily with breakfast.     fluticasone (FLONASE) 50 MCG/ACT nasal spray Place 1 spray into both nostrils daily as needed.     guaiFENesin (MUCINEX) 600 MG 12 hr tablet Take 1 tablet (600 mg total) by mouth 2 (two) times daily. 30 tablet 1   ipratropium (ATROVENT) 0.02 % nebulizer solution Take 2.5 mLs (0.5 mg total) by nebulization 4 (four) times daily. (Patient taking differently: Take 0.5 mg by nebulization every 4 (four) hours as needed (Asthma).) 75 mL 0   ipratropium (ATROVENT) 0.06 % nasal spray Place  into both nostrils.     isosorbide mononitrate (IMDUR) 30 MG 24 hr tablet Take 90 mg by mouth daily.     loratadine (CLARITIN) 10 MG tablet Take 10 mg by mouth daily.     nitroGLYCERIN (NITROSTAT) 0.4 MG SL tablet Place 1 tablet (0.4 mg total) under the tongue every 5 (five) minutes as needed for chest pain. 25 tablet 3   nystatin cream (MYCOSTATIN) Apply 1 application topically 2 (two) times daily as needed (rash).     OXYGEN Inhale 3 L/min into the lungs continuous.      pantoprazole (PROTONIX) 40 MG tablet Take 1 tablet (40 mg total) by mouth 2 (two) times daily. 180 tablet 3   polyethylene glycol (MIRALAX / GLYCOLAX) 17 g packet Take 17 g by mouth daily.     potassium chloride SA (KLOR-CON) 20 MEQ tablet Take 20 mEq by mouth every morning.     predniSONE (DELTASONE) 5 MG tablet Take by mouth.     ranolazine (RANEXA) 1000 MG SR tablet TAKE 1 TABLET BY MOUTH  TWICE DAILY 180 tablet 1   rosuvastatin (CRESTOR) 40 MG tablet Take 1 tablet (40 mg total) by mouth every evening. NEED OV. 90 tablet 2   sucralfate (CARAFATE) 1 g tablet Take 1 tablet (1 g total) by mouth in the morning, at noon, and at bedtime. As needed (Patient taking differently: Take 1 g by mouth at bedtime.) 90 tablet 3   Torsemide 40 MG TABS Take 40 mg by mouth daily. 30 tablet 6   No current facility-administered medications for this encounter.   Allergies  Allergen Reactions   Ivp Dye [Iodinated Contrast Media] Shortness Of Breath    No reaction to PO contrast with non-ionic dye.06-25-2014/rsm   Shellfish Allergy Anaphylaxis   Sulfa Antibiotics Shortness Of Breath   Iodine Hives   Atorvastatin Other (See Comments)    Pt states "causes bilateral leg pain/cramps."   Benadryl [Diphenhydramine]  Other (See Comments)    "THIS DRIVES ME CRAZY AND MAKES ME FEEL LIKE I AM DYING" No problem last time they took   Colchicine Nausea And Vomiting   Contrast Media [Iodinated Contrast Media] Hives   Doxycycline Other (See Comments)     Unknown reaction, per patient   Uloric [Febuxostat] Other (See Comments)    Unknown reaction   Cephalexin Itching and Rash   Zithromax [Azithromycin] Rash   Social History   Socioeconomic History   Marital status: Widowed    Spouse name: Asia Dusenbury- deceased   Number of children: 5   Years of education: Not on file   Highest education level: Not on file  Occupational History   Occupation: STAFF/BUFFET    Employer:  [employer]  Tobacco Use   Smoking status: Former    Packs/day: 0.25    Years: 3.00    Pack years: 0.75    Types: Cigarettes    Quit date: 02/11/1968    Years since quitting: 53.1   Smokeless tobacco: Never  Vaping Use   Vaping Use: Never used  Substance and Sexual Activity   Alcohol use: No   Drug use: No   Sexual activity: Never  Other Topics Concern   Not on file  Social History Narrative   Widowed last year. 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 -[place]   She lives with her two sons, Thayer Ohm    Other sons Haze Rushing   Social Determinants of Health   Financial Resource Strain: Not on [employer] Insecurity: No Food Insecurity   Worried About Charity fundraiser in the Last Year: Never true   Arboriculturist in the Last Year: Never true  Transportation Needs: No Transportation Needs   Lack of Transportation (Medical): No   Lack of Transportation (Non-Medical): No  Physical Activity: Not on file  Stress: Not on file  Social Connections: Moderately Integrated   Frequency of Communication with Friends and Family: Three times a week   Frequency of Social Gatherings with Friends and Family: Three times a week   Attends Religious Services: 1 to 4 times per year   Active Member of Clubs or Organizations: Yes   Attends Archivist Meetings: 1 to 4 times per year   Marital Status: Widowed  Human resources officer Violence: Not At Risk   Fear of  Current or Ex-Partner: No   Emotionally Abused: No   Physically Abused: No   Sexually Abused: No   Family History  Problem Relation Age of Onset   Breast cancer Mother    Diabetes Mother    Heart disease Maternal Grandmother    Kidney disease Maternal Grandmother    Diabetes Maternal Grandmother    Glaucoma Maternal Aunt    Heart disease Maternal Aunt    Asthma Sister    Colon cancer Neg Hx    Stomach cancer Neg Hx    Pancreatic cancer Neg Hx    BP 90/60    Pulse 65    Wt 103.6 kg    SpO2 98%    BMI 40.46 kg/m   Wt Readings from Last 3 Encounters:  03/18/21 103.6 kg  02/13/21 103.9 kg  01/18/21 103.9 kg    PHYSICAL EXAM: General:  NAD. No resp difficulty, arrived in [facility] on oxygen. HEENT: Normal Neck: Supple. No JVD. Carotids 2+ bilat; no bruits. No  lymphadenopathy or thryomegaly appreciated. Cor: PMI nondisplaced. Regular rate & rhythm. No rubs, gallops or murmurs. Lungs: Clear Abdomen: Obese, nontender, nondistended. No hepatosplenomegaly. No bruits or masses. Good bowel sounds. Extremities: No cyanosis, clubbing, rash, 1+ LLE edema Neuro: Alert & oriented x 3, cranial nerves grossly intact. Moves all 4 extremities w/o difficulty. Affect pleasant.  Device interrogation (personally reviewed): >99% LV paced, no VT, 1 hour daily activity.  ECG (personally reviewed): NSR, Bi-V pacing  ASSESSMENT & PLAN: 1. Chronic systolic/diastolic HF  - Echo 1/61 EF 55-60%, mild LVH.  - RHC in 8/18 looked good. - Echo 12/20 EF down to 40-45% in setting of loss of LV pacing which has now been restored - Echo 4/21 EF 50-55%--->09/2020 EF 55-60% RV normal.  - NYHA III. Volume status difficult to assess, but she does not look overloaded today, mild LLE edema. - I think her "spells" could be related to blood sugar changes, BP and/or fluid/electrolyte imbalances. - With low BP, decrease  bisoprolol to 7.5 mg daily. - Continue torsemide 40 mg daily. - Continue Jardiance 10 mg daily. -  Discussed low salt food and limiting fluids to < 2 liters per day.   - BMET and BNP today.    2. CAD s/p CABG 11/2014 - s/p LCX stents in 2018. - On Ranexa + Imdur.  - No chest pain.   3. Morbid obesity - Body mass index is 40.46 kg/m. - Consider referral to pharmacy for semaglutide.  4. H/o heart block s/p BiV pacer/CRT-D [employer] - Had gen change out 12/31/20 by Dr Orene Desanctis.  5. CKD III - SCr baseline 1.5-1.7 - Continue Jardiance 10 mg daily.  - Check BMET.  - Encouraged her to follow up with Nephrology.  6. OSA - Says she can't tolerate CPAP.  7. Chronic respiratory failure - Likely OSA/OHS - Continue O2 3 liters [place]. Sats stable.   8. DM2 - A1c 6.5 11/22 - Continue Jardiance 10 mg daily.   9. Yeast infection: - Recently finished abx for cellulitis. - Give fluconazole x 1. Notify clinic if recurs and will stop Jardiance.  Follow up with Dr. Haroldine Laws in 2-3 months.  Rafael Bihari, FNP   10:12 AM

## 2021-03-15 NOTE — Telephone Encounter (Signed)
Called to confirm/remind patient of their appointment at the [facility] on 04/15/21.   Patient reminded to bring all medications and/or complete list.  Confirmed patient has transportation. Gave directions, instructed to utilize [place] parking.  Patient was provided with the [facility] number for transportation to our office on Monday 04/15/21 and will give our office a call back if needed.

## 2021-03-16 DIAGNOSIS — Z7689 Persons encountering health services in other specified circumstances: Secondary | ICD-10-CM | POA: Diagnosis not present

## 2021-03-18 ENCOUNTER — Encounter (HOSPITAL_COMMUNITY): Payer: Self-pay

## 2021-03-18 ENCOUNTER — Other Ambulatory Visit: Payer: Self-pay

## 2021-03-18 ENCOUNTER — Ambulatory Visit (HOSPITAL_COMMUNITY)
Admission: RE | Admit: 2021-03-18 | Discharge: 2021-03-18 | Disposition: A | Discharge status: Home / Self Care | Payer: Medicare Other | Source: Ambulatory Visit | Attending: Family Medicine | Admitting: Family Medicine

## 2021-03-18 VITALS — BP 90/60 | HR 65 | Wt 228.4 lb

## 2021-03-18 DIAGNOSIS — J9611 Chronic respiratory failure with hypoxia: Secondary | ICD-10-CM | POA: Diagnosis not present

## 2021-03-18 DIAGNOSIS — I25118 Atherosclerotic heart disease of native coronary artery with other forms of angina pectoris: Secondary | ICD-10-CM | POA: Diagnosis not present

## 2021-03-18 DIAGNOSIS — I2510 Atherosclerotic heart disease of native coronary artery without angina pectoris: Secondary | ICD-10-CM | POA: Diagnosis not present

## 2021-03-18 DIAGNOSIS — E1122 Type 2 diabetes mellitus with diabetic chronic kidney disease: Secondary | ICD-10-CM | POA: Insufficient documentation

## 2021-03-18 DIAGNOSIS — I442 Atrioventricular block, complete: Secondary | ICD-10-CM

## 2021-03-18 DIAGNOSIS — J9610 Chronic respiratory failure, unspecified whether with hypoxia or hypercapnia: Secondary | ICD-10-CM | POA: Insufficient documentation

## 2021-03-18 DIAGNOSIS — G4733 Obstructive sleep apnea (adult) (pediatric): Secondary | ICD-10-CM | POA: Insufficient documentation

## 2021-03-18 DIAGNOSIS — B379 Candidiasis, unspecified: Secondary | ICD-10-CM | POA: Diagnosis not present

## 2021-03-18 DIAGNOSIS — E860 Dehydration: Secondary | ICD-10-CM | POA: Insufficient documentation

## 2021-03-18 DIAGNOSIS — J449 Chronic obstructive pulmonary disease, unspecified: Secondary | ICD-10-CM | POA: Insufficient documentation

## 2021-03-18 DIAGNOSIS — N1831 Chronic kidney disease, stage 3a: Secondary | ICD-10-CM

## 2021-03-18 DIAGNOSIS — I130 Hypertensive heart and chronic kidney disease with heart failure and stage 1 through stage 4 chronic kidney disease, or unspecified chronic kidney disease: Secondary | ICD-10-CM | POA: Insufficient documentation

## 2021-03-18 DIAGNOSIS — J9612 Chronic respiratory failure with hypercapnia: Secondary | ICD-10-CM

## 2021-03-18 DIAGNOSIS — Z950 Presence of cardiac pacemaker: Secondary | ICD-10-CM

## 2021-03-18 DIAGNOSIS — Z79899 Other long term (current) drug therapy: Secondary | ICD-10-CM | POA: Insufficient documentation

## 2021-03-18 DIAGNOSIS — Z951 Presence of aortocoronary bypass graft: Secondary | ICD-10-CM | POA: Diagnosis not present

## 2021-03-18 DIAGNOSIS — B3731 Acute candidiasis of vulva and vagina: Secondary | ICD-10-CM | POA: Diagnosis not present

## 2021-03-18 DIAGNOSIS — N183 Chronic kidney disease, stage 3 unspecified: Secondary | ICD-10-CM | POA: Diagnosis not present

## 2021-03-18 DIAGNOSIS — I5042 Chronic combined systolic (congestive) and diastolic (congestive) heart failure: Secondary | ICD-10-CM | POA: Diagnosis not present

## 2021-03-18 DIAGNOSIS — I5032 Chronic diastolic (congestive) heart failure: Secondary | ICD-10-CM

## 2021-03-18 DIAGNOSIS — Z7984 Long term (current) use of oral hypoglycemic drugs: Secondary | ICD-10-CM | POA: Diagnosis not present

## 2021-03-18 DIAGNOSIS — Z6841 Body Mass Index (BMI) 40.0 and over, adult: Secondary | ICD-10-CM | POA: Insufficient documentation

## 2021-03-18 DIAGNOSIS — K219 Gastro-esophageal reflux disease without esophagitis: Secondary | ICD-10-CM | POA: Diagnosis not present

## 2021-03-18 DIAGNOSIS — M1990 Unspecified osteoarthritis, unspecified site: Secondary | ICD-10-CM | POA: Insufficient documentation

## 2021-03-18 LAB — BASIC METABOLIC PANEL
Anion gap: 7 (ref 5–15)
BUN: 22 mg/dL (ref 8–23)
CO2: 38 mmol/L — ABNORMAL HIGH (ref 22–32)
Calcium: 8.9 mg/dL (ref 8.9–10.3)
Chloride: 92 mmol/L — ABNORMAL LOW (ref 98–111)
Creatinine, Ser: 1.55 mg/dL — ABNORMAL HIGH (ref 0.44–1.00)
GFR, Estimated: 35 mL/min — ABNORMAL LOW (ref >60)
Glucose, Bld: 135 mg/dL — ABNORMAL HIGH (ref 70–99)
Potassium: 4 mmol/L (ref 3.5–5.1)
Sodium: 137 mmol/L (ref 135–145)

## 2021-03-18 LAB — BRAIN NATRIURETIC PEPTIDE: B Natriuretic Peptide: 721.2 pg/mL — ABNORMAL HIGH (ref 0.0–100.0)

## 2021-03-18 MED ORDER — FLUCONAZOLE 150 MG PO TABS: 150.0000 mg | ORAL_TABLET | Freq: Every day | ORAL | 0 refills | Status: DC

## 2021-03-18 MED ORDER — BISOPROLOL FUMARATE 5 MG PO TABS: 7.5000 mg | ORAL_TABLET | Freq: Every day | ORAL | 4 refills | Status: DC

## 2021-03-18 NOTE — Addendum Note (Signed)
Encounter addended by: Rafael Bihari, FNP on: 03/18/2021 1:04 PM  Actions taken: Clinical Note Signed

## 2021-03-18 NOTE — Patient Instructions (Addendum)
Thank you for coming in today  Labs were done today, if any labs are abnormal the clinic will call you  DECREASE Bisoprolol to 7.5 mg 1 1/2 tablets daily  TAKE 1 Diflucan 150 mg 1 tablet once for yeast infection  RESCHEDULE your Nephrology/kidney appointment  Your physician recommends that you schedule a follow-up appointment in:  2-3 months with Dr. Haroldine Laws  At the [facility], you and your health needs are our priority. As part of our continuing mission to provide you with exceptional heart care, we have created designated Provider Care Teams. These Care Teams include your primary Cardiologist (physician) and Advanced Practice Providers (APPs- Physician Assistants and Nurse Practitioners) who all work together to provide you with the care you need, when you need it.   You may see any of the following providers on your designated Care Team at your next follow up: Dr Glori Bickers Dr Haynes Kerns, NP Lyda Jester, [place] [facility] [place], [place] Audry Riles, PharmD   Please be sure to bring in all your medications bottles to every appointment.   If you have any questions or concerns before your next appointment please send [place] a message through [place] or call our office at [phone].    TO LEAVE A MESSAGE FOR THE NURSE SELECT OPTION 2, PLEASE LEAVE A MESSAGE INCLUDING: YOUR NAME DATE OF BIRTH CALL BACK NUMBER REASON FOR CALL**this is important as we prioritize the call backs  YOU WILL RECEIVE A CALL BACK THE SAME DAY AS LONG AS YOU CALL BEFORE 4:00 PM

## 2021-03-19 DIAGNOSIS — E1122 Type 2 diabetes mellitus with diabetic chronic kidney disease: Secondary | ICD-10-CM | POA: Diagnosis not present

## 2021-03-19 DIAGNOSIS — K5900 Constipation, unspecified: Secondary | ICD-10-CM | POA: Diagnosis not present

## 2021-03-19 DIAGNOSIS — I5032 Chronic diastolic (congestive) heart failure: Secondary | ICD-10-CM | POA: Diagnosis not present

## 2021-03-19 DIAGNOSIS — I5031 Acute diastolic (congestive) heart failure: Secondary | ICD-10-CM | POA: Diagnosis not present

## 2021-03-19 DIAGNOSIS — N183 Chronic kidney disease, stage 3 unspecified: Secondary | ICD-10-CM | POA: Diagnosis not present

## 2021-03-19 DIAGNOSIS — Z7902 Long term (current) use of antithrombotics/antiplatelets: Secondary | ICD-10-CM | POA: Diagnosis not present

## 2021-03-19 DIAGNOSIS — Z7689 Persons encountering health services in other specified circumstances: Secondary | ICD-10-CM | POA: Diagnosis not present

## 2021-03-19 DIAGNOSIS — I10 Essential (primary) hypertension: Secondary | ICD-10-CM | POA: Diagnosis not present

## 2021-03-19 DIAGNOSIS — D649 Anemia, unspecified: Secondary | ICD-10-CM | POA: Diagnosis not present

## 2021-03-19 DIAGNOSIS — G4733 Obstructive sleep apnea (adult) (pediatric): Secondary | ICD-10-CM | POA: Diagnosis not present

## 2021-03-19 DIAGNOSIS — M109 Gout, unspecified: Secondary | ICD-10-CM | POA: Diagnosis not present

## 2021-03-20 ENCOUNTER — Telehealth (HOSPITAL_COMMUNITY): Payer: Self-pay | Admitting: Surgery

## 2021-03-20 NOTE — Telephone Encounter (Signed)
-----   Message from Rafael Bihari, [place] sent at 03/20/2021  4:06 PM EST ----- BNP elevated, increase torsemide to 60 mg daily. Repeat BMET and BNP in 10-14 days.

## 2021-03-20 NOTE — Telephone Encounter (Signed)
Patient called in reference to results and recommendations per Allena Katz NP.  I left a message for a return call.

## 2021-03-21 DIAGNOSIS — Z7689 Persons encountering health services in other specified circumstances: Secondary | ICD-10-CM | POA: Diagnosis not present

## 2021-03-22 ENCOUNTER — Telehealth: Payer: Self-pay | Admitting: Cardiovascular Disease

## 2021-03-22 NOTE — Telephone Encounter (Signed)
°*  STAT* If patient is at the pharmacy, call can be transferred to refill team.   1. Which medications need to be refilled? (please list name of each medication and dose if known) potassium chloride SA (KLOR-CON) 20 MEQ tablet   2. Which pharmacy/location (including street and city if local pharmacy) is medication to be sent to?[facility], [place]  Phone:  [phone] Fax:  [phone]   3. Do they need a 30 day or 90 day supply? 30ds

## 2021-03-22 NOTE — Telephone Encounter (Signed)
Please refill.

## 2021-03-23 DIAGNOSIS — Z7689 Persons encountering health services in other specified circumstances: Secondary | ICD-10-CM | POA: Diagnosis not present

## 2021-03-23 MED ORDER — POTASSIUM CHLORIDE CRYS ER 20 MEQ PO TBCR: 20.0000 meq | EXTENDED_RELEASE_TABLET | Freq: Every morning | ORAL | 0 refills | Status: DC

## 2021-03-23 NOTE — Telephone Encounter (Signed)
One month refill sent due to patient having an appointment on 2/20 (in case changes are made). More refills will be sent as need at the appointment.

## 2021-03-26 DIAGNOSIS — K5900 Constipation, unspecified: Secondary | ICD-10-CM | POA: Diagnosis not present

## 2021-03-26 DIAGNOSIS — Z7689 Persons encountering health services in other specified circumstances: Secondary | ICD-10-CM | POA: Diagnosis not present

## 2021-03-26 DIAGNOSIS — N183 Chronic kidney disease, stage 3 unspecified: Secondary | ICD-10-CM | POA: Diagnosis not present

## 2021-03-26 DIAGNOSIS — I5032 Chronic diastolic (congestive) heart failure: Secondary | ICD-10-CM | POA: Diagnosis not present

## 2021-03-26 DIAGNOSIS — I5031 Acute diastolic (congestive) heart failure: Secondary | ICD-10-CM | POA: Diagnosis not present

## 2021-03-26 DIAGNOSIS — I10 Essential (primary) hypertension: Secondary | ICD-10-CM | POA: Diagnosis not present

## 2021-03-26 DIAGNOSIS — E1122 Type 2 diabetes mellitus with diabetic chronic kidney disease: Secondary | ICD-10-CM | POA: Diagnosis not present

## 2021-03-26 DIAGNOSIS — G4733 Obstructive sleep apnea (adult) (pediatric): Secondary | ICD-10-CM | POA: Diagnosis not present

## 2021-03-26 DIAGNOSIS — M109 Gout, unspecified: Secondary | ICD-10-CM | POA: Diagnosis not present

## 2021-03-26 DIAGNOSIS — D649 Anemia, unspecified: Secondary | ICD-10-CM | POA: Diagnosis not present

## 2021-03-26 DIAGNOSIS — Z7902 Long term (current) use of antithrombotics/antiplatelets: Secondary | ICD-10-CM | POA: Diagnosis not present

## 2021-03-27 DIAGNOSIS — I509 Heart failure, unspecified: Secondary | ICD-10-CM | POA: Diagnosis not present

## 2021-03-27 DIAGNOSIS — D631 Anemia in chronic kidney disease: Secondary | ICD-10-CM | POA: Diagnosis not present

## 2021-03-27 DIAGNOSIS — N184 Chronic kidney disease, stage 4 (severe): Secondary | ICD-10-CM | POA: Diagnosis not present

## 2021-03-27 DIAGNOSIS — N1832 Chronic kidney disease, stage 3b: Secondary | ICD-10-CM | POA: Diagnosis not present

## 2021-03-27 DIAGNOSIS — I129 Hypertensive chronic kidney disease with stage 1 through stage 4 chronic kidney disease, or unspecified chronic kidney disease: Secondary | ICD-10-CM | POA: Diagnosis not present

## 2021-03-27 DIAGNOSIS — N2581 Secondary hyperparathyroidism of renal origin: Secondary | ICD-10-CM | POA: Diagnosis not present

## 2021-03-27 DIAGNOSIS — I442 Atrioventricular block, complete: Secondary | ICD-10-CM | POA: Diagnosis not present

## 2021-03-27 DIAGNOSIS — E1122 Type 2 diabetes mellitus with diabetic chronic kidney disease: Secondary | ICD-10-CM | POA: Diagnosis not present

## 2021-03-28 DIAGNOSIS — Z7689 Persons encountering health services in other specified circumstances: Secondary | ICD-10-CM | POA: Diagnosis not present

## 2021-03-28 IMAGING — CT CT T SPINE W/O CM
3 of 4 series · 9 of 33 positions shown, 11 images · non-contrast
Comparison: Thoracic spine radiographs 09/25/2012

CLINICAL DATA: Back trauma.

EXAM:
CT THORACIC SPINE WITHOUT CONTRAST
TECHNIQUE: Multidetector CT images of the thoracic were obtained using the
standard protocol without intravenous contrast.

[Series 4: t-spine 2.0 st · axial · 0.39mm/px · z∈[-342,-342]mm · 1 of 176 slices shown, 2 images]
[im 88/176  soft-tissue]
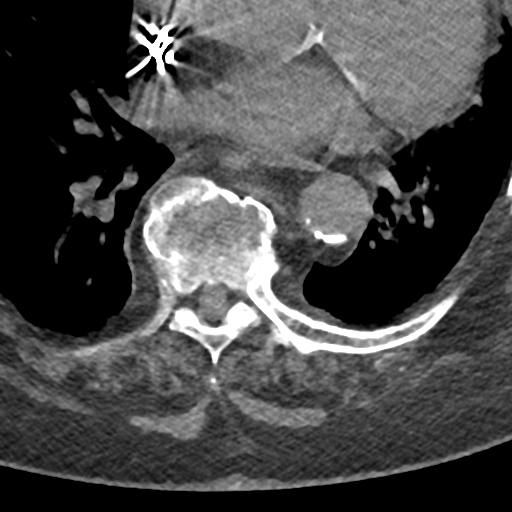
[im 88/176  bone]
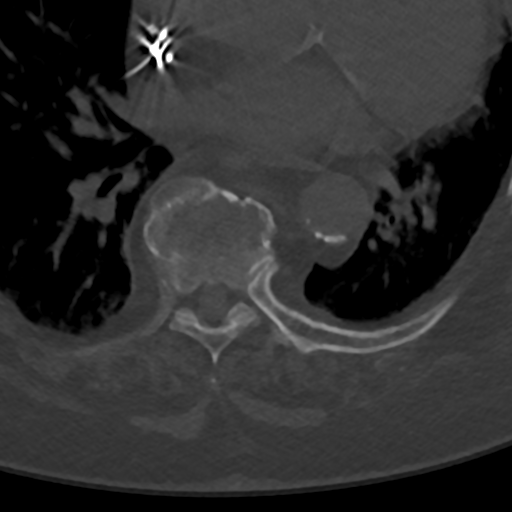

[Series 6: t-spine 2.0 cor bone · coronal · 0.31mm/px · 3 of 85 slices shown]
[im 17/85  bone]
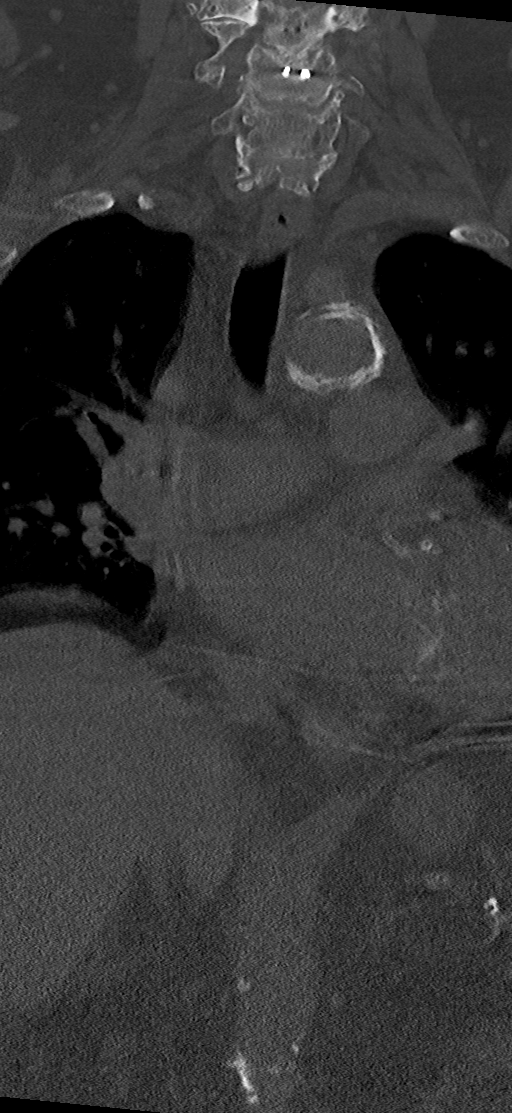
[im 34/85  bone]
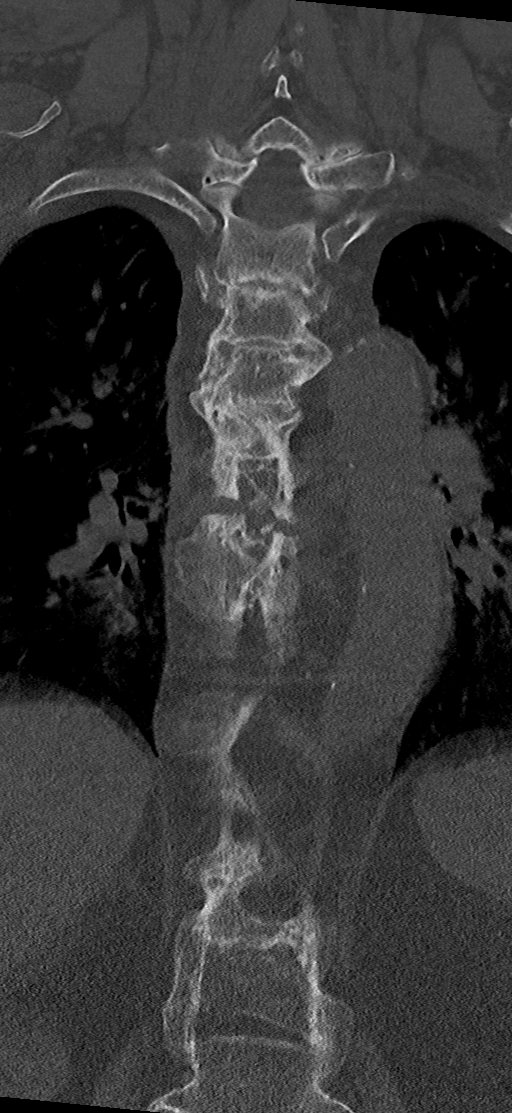
[im 51/85  bone]
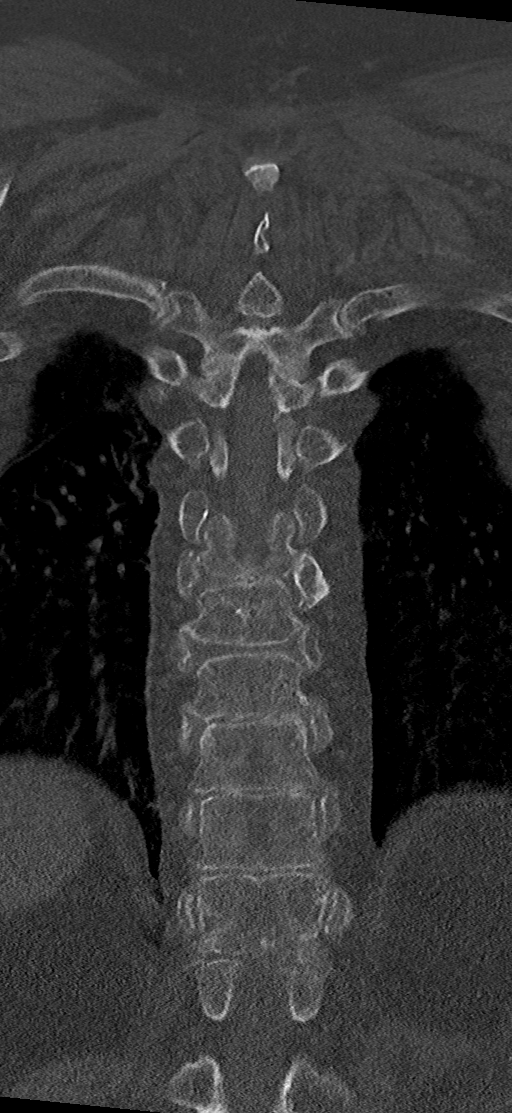

[Series 7: t-spine 2.0 sag bone · sagittal · 0.36mm/px · 5 of 78 slices shown, 6 images]
[im 26/78  bone]
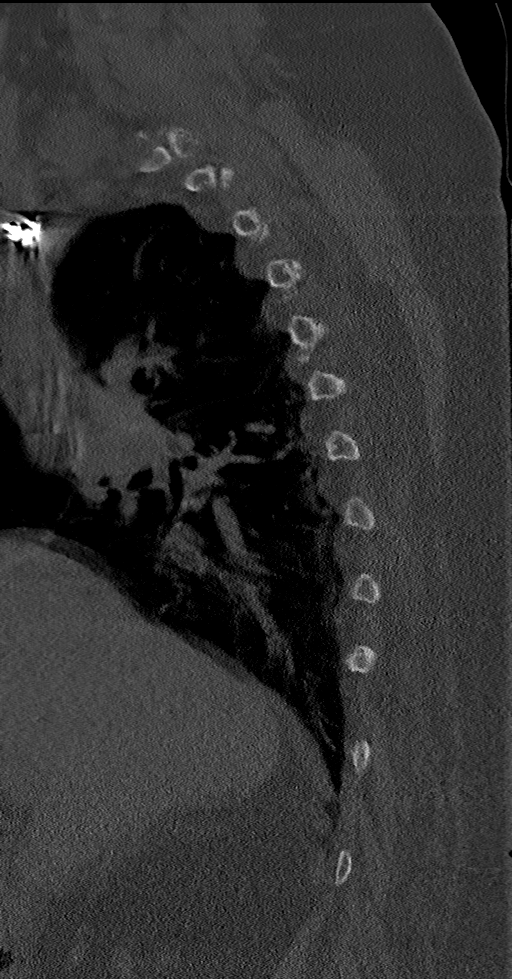
[im 33/78  bone]
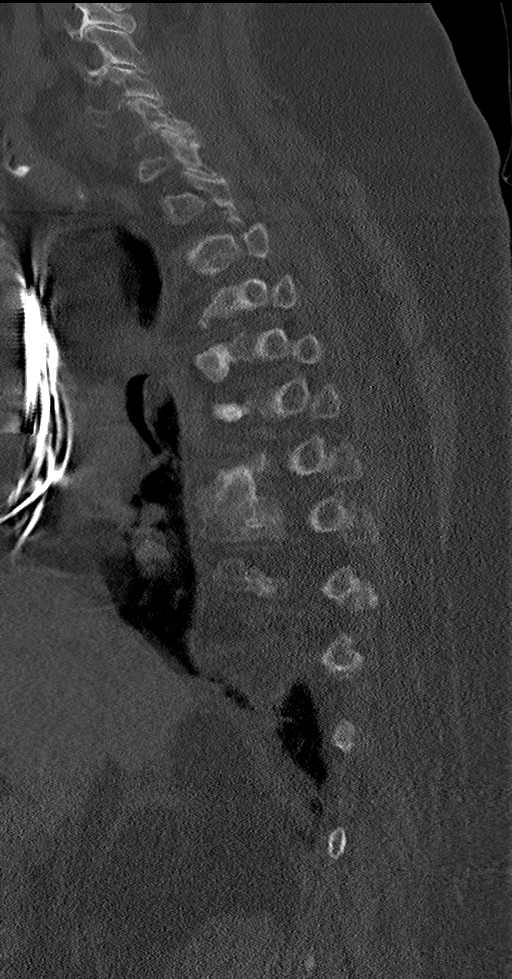
[im 39/78  soft-tissue]
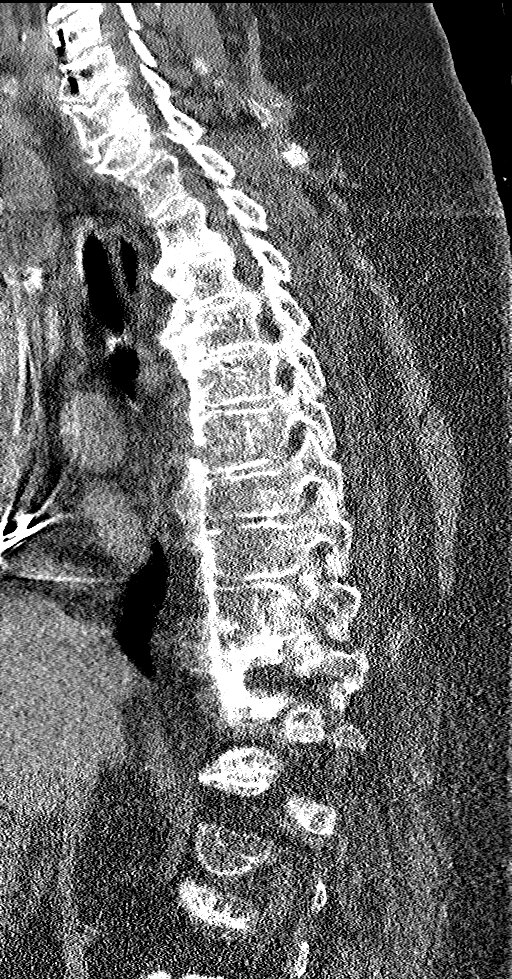
[im 39/78  bone]
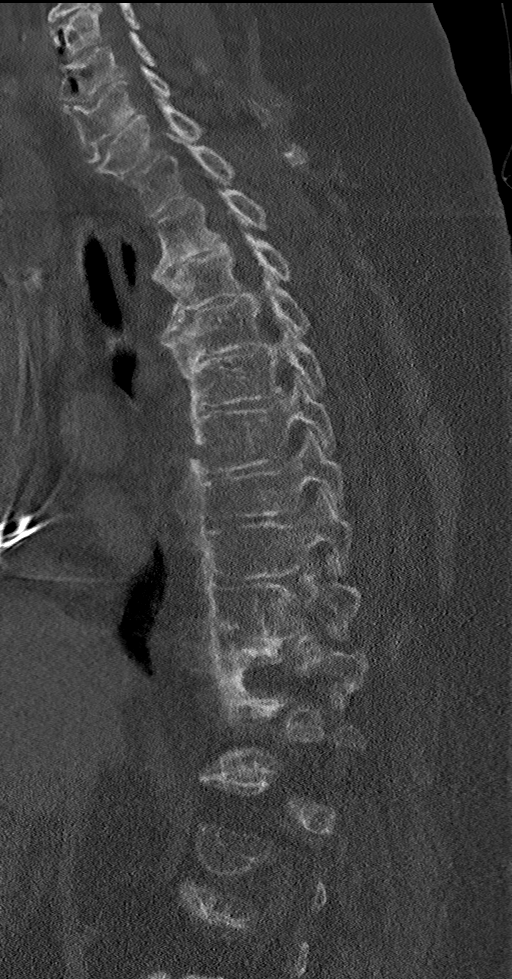
[im 45/78  bone]
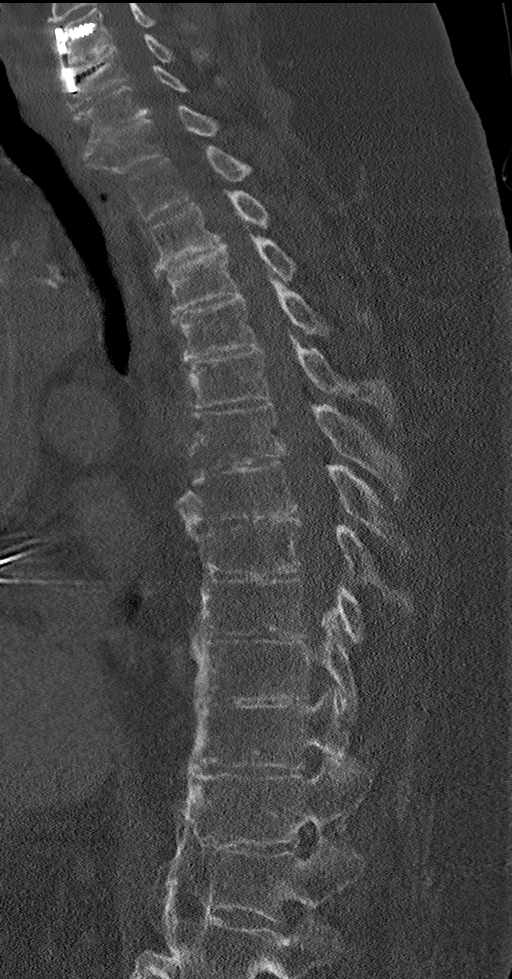
[im 52/78  bone]
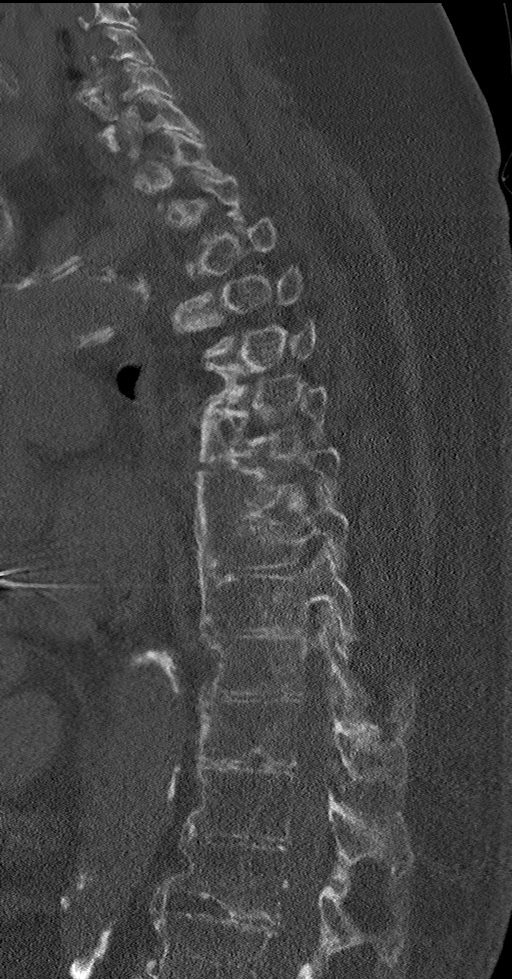

[9 of 33 positions shown; findings below may reference images not displayed]

FINDINGS: Alignment: Normal.

Vertebrae: Interbody ankylosis extending from T2 into the upper
lumbar spine. Acute predominantly horizontally oriented acute
fracture through the inferior aspect of the T6 vertebral body
extending into the inferior endplate and disc space with mild
distraction anteriorly. The fracture also likely involves the far
lateral aspect of the T7 vertebral body on the left. No posterior
element fracture is identified. Deformities of the T7 and T8
vertebrae which may reflect remote, healed fractures. Old posterior
left eleventh rib fracture.

Paraspinal and other soft tissues: Small volume hematoma in the
paravertebral soft tissues at T6. partially visualized ICD leads.
Aortic atherosclerosis.

Disc levels: Diffuse thoracic ankylosis. Prominent degenerative
endplate changes at T2-3 with endplate and facet spurring resulting
in mild right and moderate left neural foraminal stenosis.
IMPRESSION: 1. Horizontal, mildly distracted fracture of the T6 vertebral body
involving the inferior endplate, T6-7 disc space, and far lateral
aspect of the T7 vertebral body in the setting of diffuse
intervertebral ankylosis. No posterior element fracture identified.
2. Aortic Atherosclerosis ([id number].Z).

## 2021-03-28 IMAGING — DX DG CHEST 1V PORT
1 series · 1 of 1 positions shown · non-contrast
Comparison: 05/31/2019

CLINICAL DATA: Fall.

EXAM:
PORTABLE CHEST 1 VIEW

[chest]
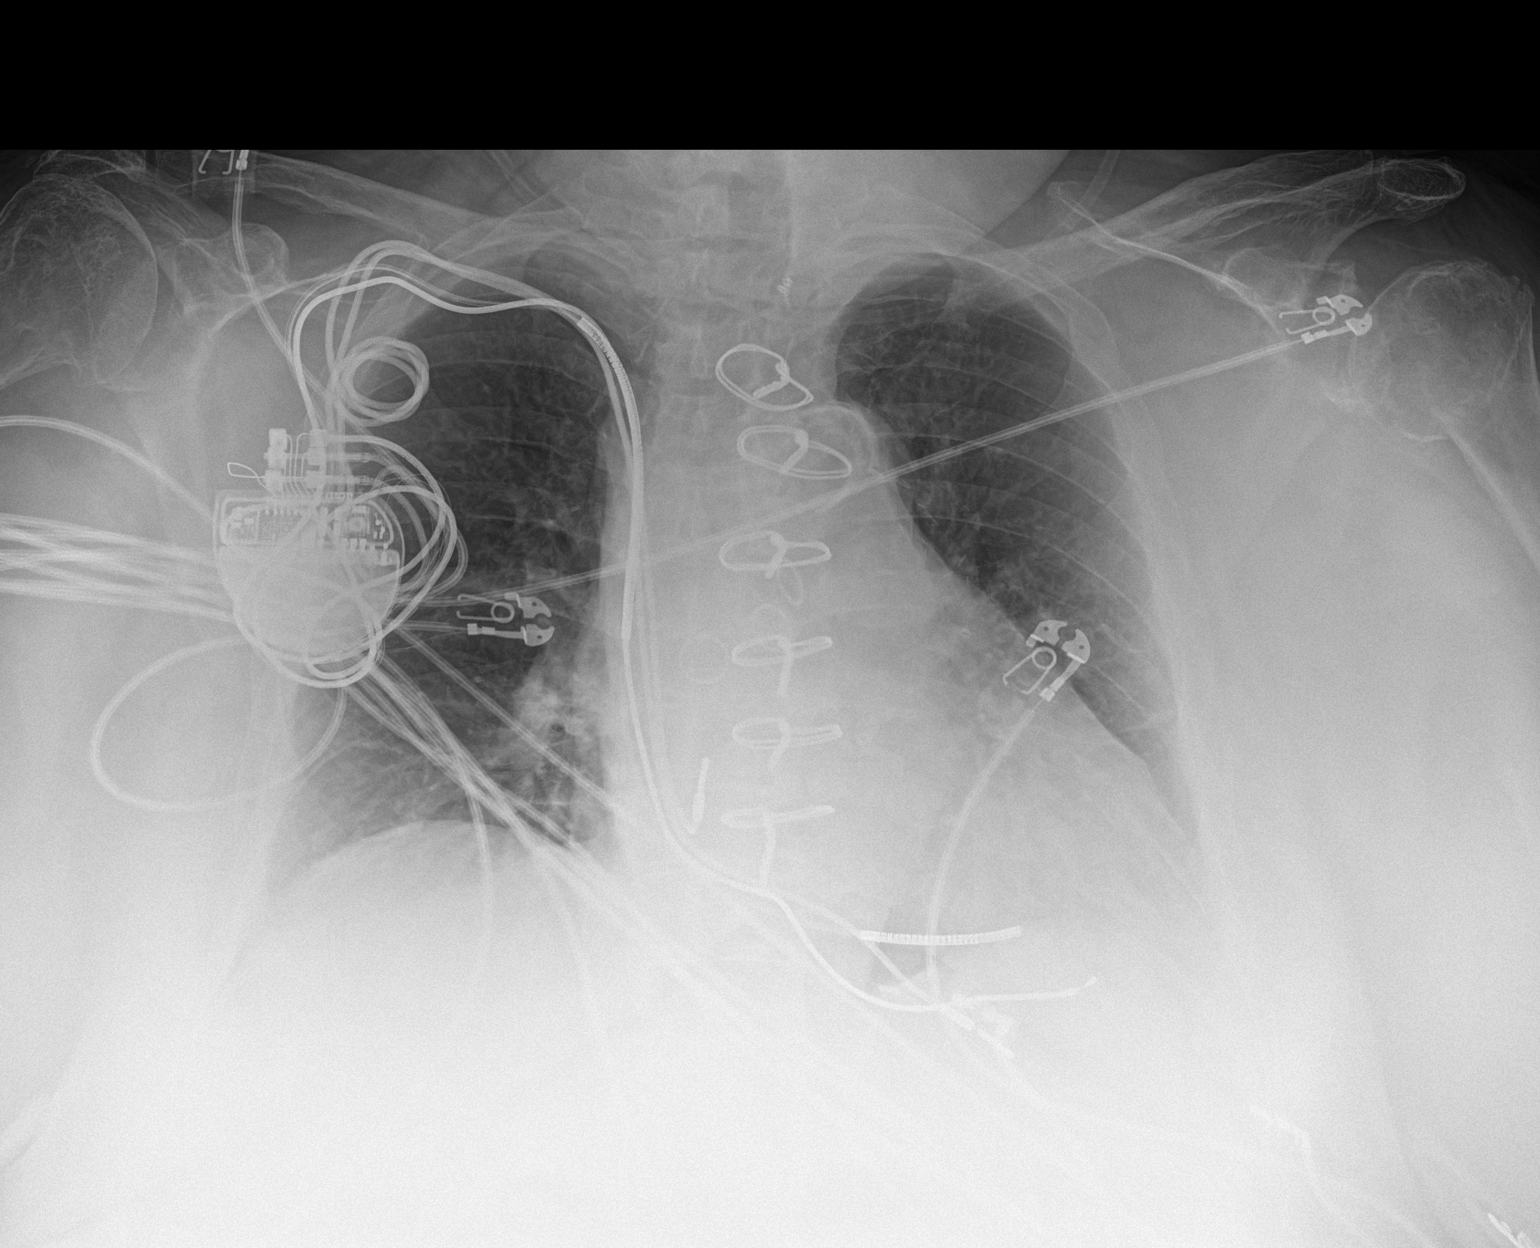

[1 of 1 positions shown; findings below may reference images not displayed]

FINDINGS: Right chest wall ICD is noted with leads in the right atrial
appendage and right ventricle. Previous median sternotomy. Aortic
atherosclerosis. No pleural effusion or edema identified. No
airspace densities.
IMPRESSION: No active cardiopulmonary abnormalities.

## 2021-03-28 IMAGING — CT CT CERVICAL SPINE W/O CM
3 of 4 series · 12 of 33 positions shown, 14 images · non-contrast
Comparison: Head CT 10/27/2018.  Cervical spine CT 10/22/2015.

CLINICAL DATA: Trauma.

EXAM:
CT HEAD WITHOUT CONTRAST
CT MAXILLOFACIAL WITHOUT CONTRAST
CT CERVICAL SPINE WITHOUT CONTRAST
TECHNIQUE: Multidetector CT imaging of the head, cervical spine, and
maxillofacial structures were performed using the standard protocol
without intravenous contrast. Multiplanar CT image reconstructions
of the cervical spine and maxillofacial structures were also
generated.

[Series 4: c_spine 2.0 st · axial · 0.32mm/px · z∈[-243,-121]mm · 4 of 93 slices shown, 5 images]
[im 16/93  soft-tissue]
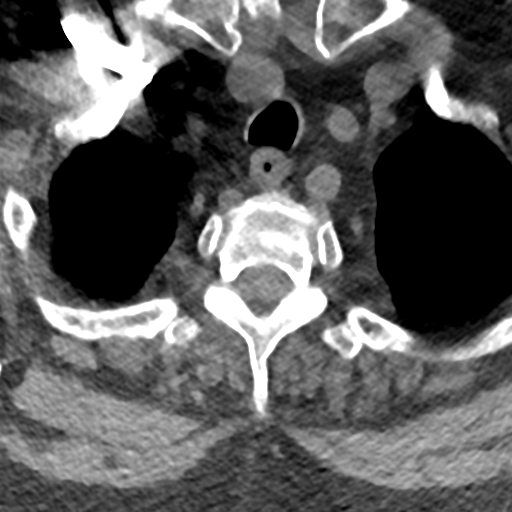
[im 16/93  bone]
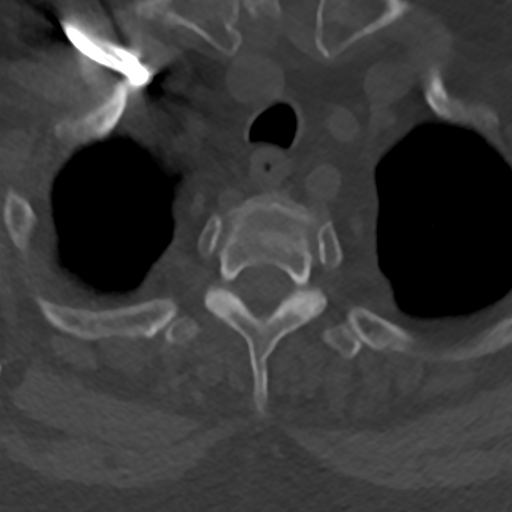
[im 31/93  bone]
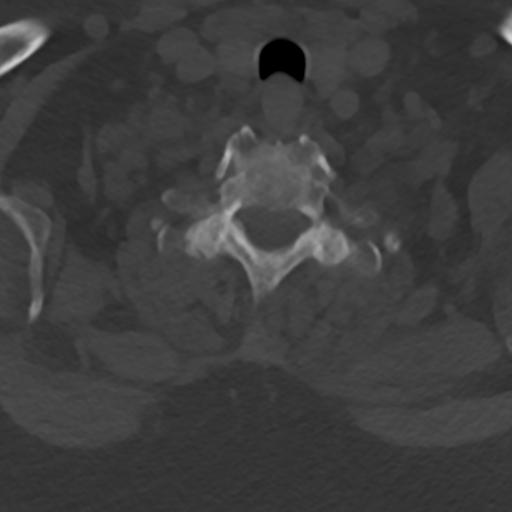
[im 62/93  bone]
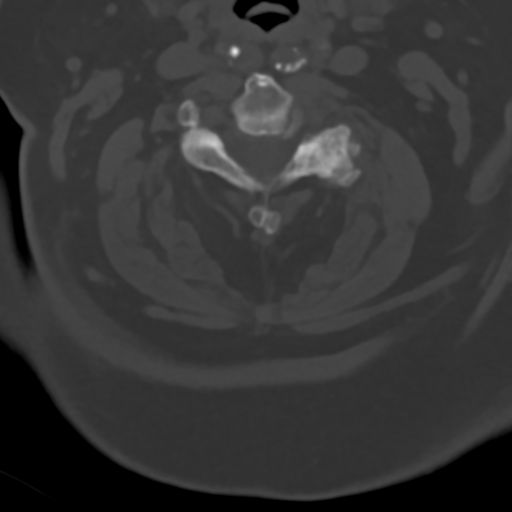
[im 77/93  bone]
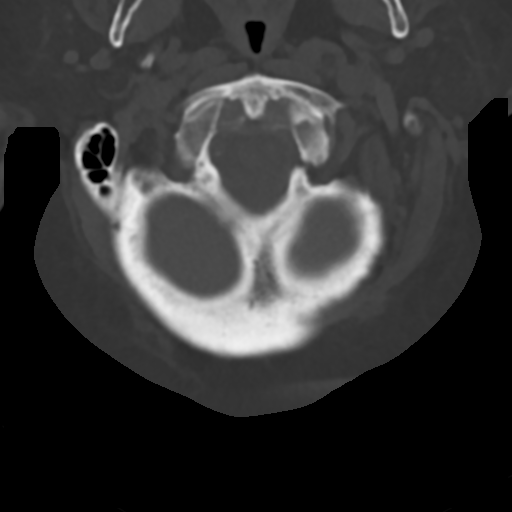

[Series 6: c_spine 2.0 sag bone · sagittal · 0.29mm/px · 5 of 61 slices shown, 6 images]
[im 21/61  bone]
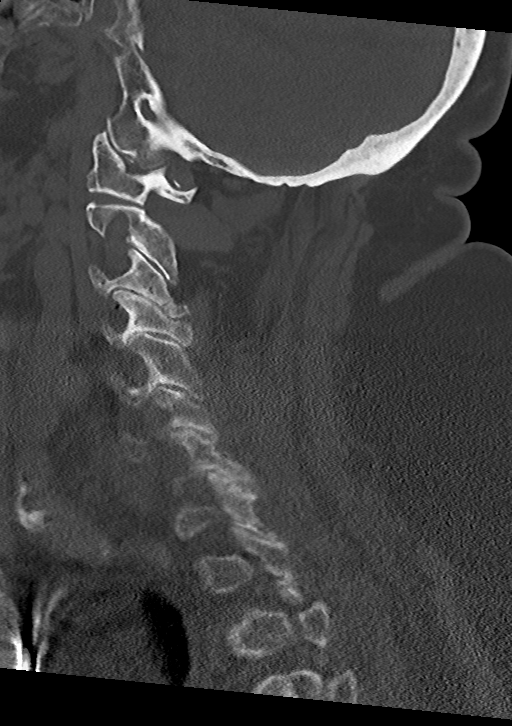
[im 26/61  bone]
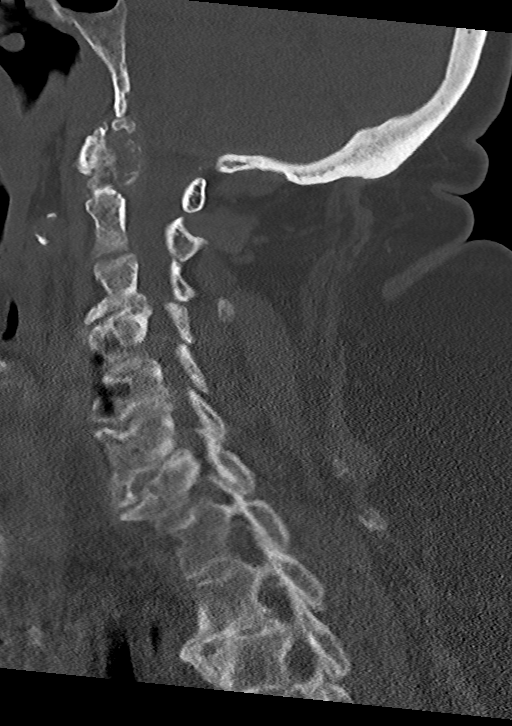
[im 31/61  soft-tissue]
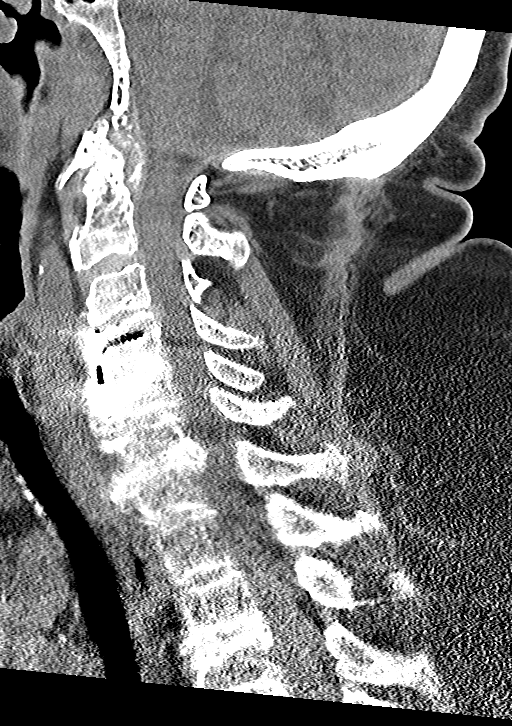
[im 31/61  bone]
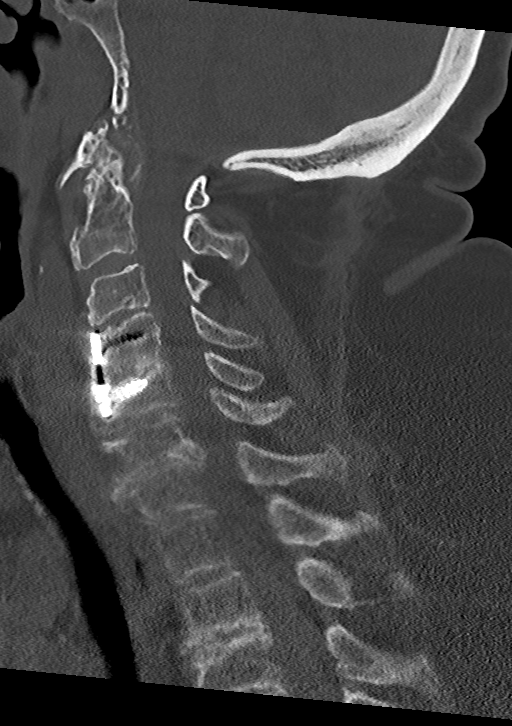
[im 36/61  bone]
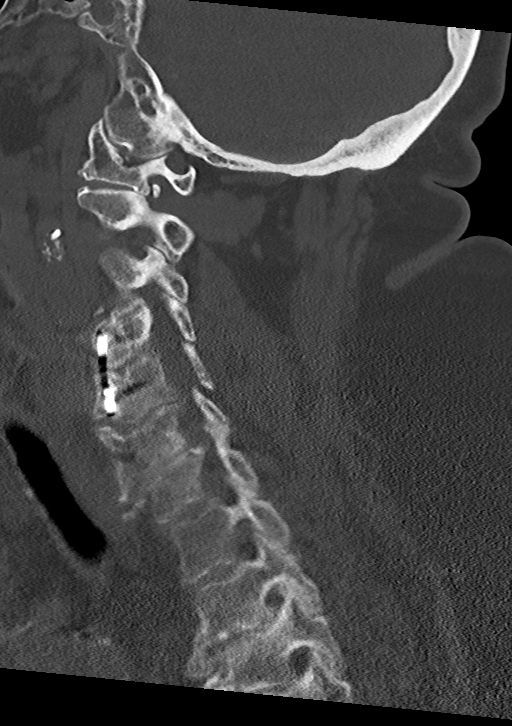
[im 41/61  bone]
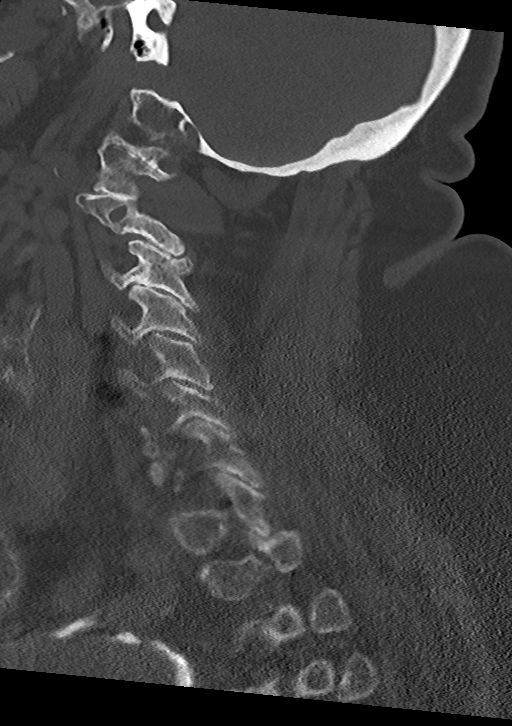

[Series 7: c_spine 2.0 cor bone · coronal · 0.29mm/px · 3 of 61 slices shown]
[im 13/61  bone]
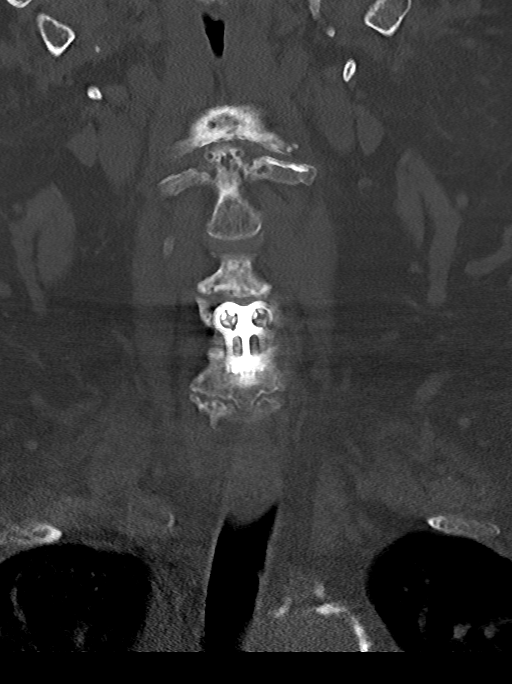
[im 25/61  bone]
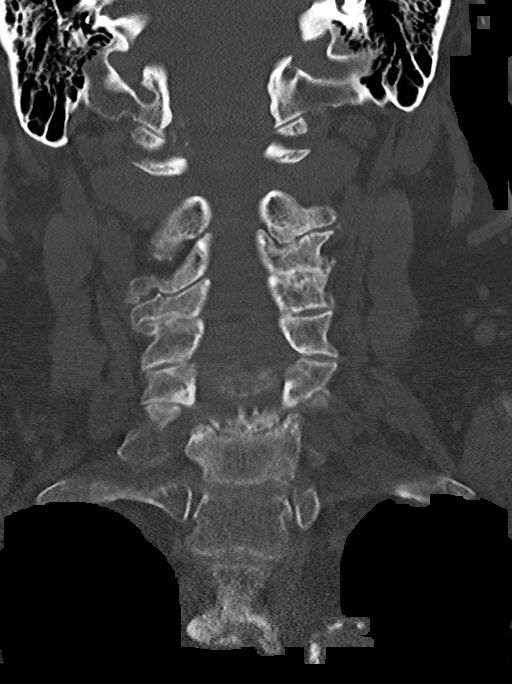
[im 37/61  bone]
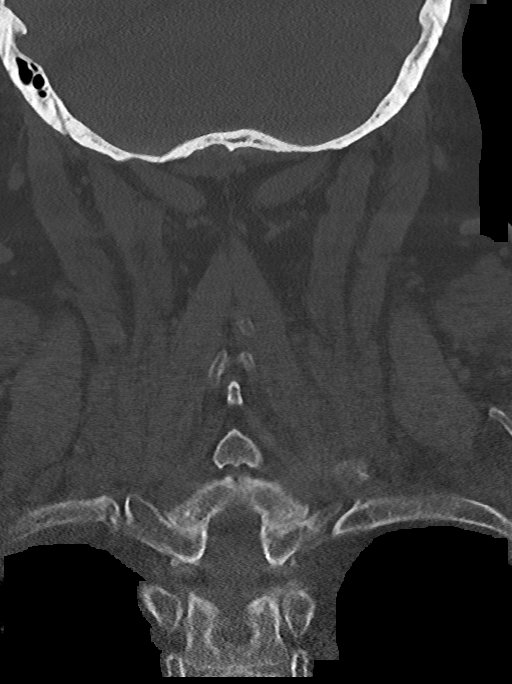

[12 of 33 positions shown; findings below may reference images not displayed]

FINDINGS: CT HEAD FINDINGS

Brain: There is no evidence of an acute infarct, intracranial
hemorrhage, mass, midline shift, or extra-axial fluid collection.
Mild cerebral atrophy is within normal limits for age. Hypodensities
in the cerebral white matter bilaterally are unchanged and
nonspecific but compatible with mild chronic small vessel ischemic
disease. There is an unchanged chronic lacunar infarct in the left
caudate nucleus.

Vascular: Calcified atherosclerosis at the skull base.

Skull: No fracture or suspicious osseous lesion.

Other: Right frontal scalp hematoma.

CT MAXILLOFACIAL FINDINGS

Osseous: No acute fracture, mandibular dislocation, or destructive
osseous process.

Orbits: Bilateral cataract extraction. Moderately large right
periorbital hematoma extending inferiorly into the right cheek. No
retro bulbar hematoma.

Sinuses: Paranasal sinuses and mastoid air cells are clear.

Soft tissues: No additional acute findings.

CT CERVICAL SPINE FINDINGS

Alignment: Chronic straightening of the normal cervical lordosis.
Minimal anterolisthesis of C7 on T1, likely degenerative.

Skull base and vertebrae: No acute fracture or suspicious osseous
lesion. C4-5 ACDF. Prominent median C1-2 arthropathy.

Soft tissues and spinal canal: No prevertebral fluid or swelling. No
visible canal hematoma.

Disc levels: Advanced disc degeneration at C5-6, C6-7, and T2-3.
Advanced facet arthrosis at multiple levels in the cervical and
included upper thoracic spine. Moderate spinal stenosis at C5-6 and
C6-7 due to broad-based posterior disc osteophyte complexes.

Upper chest: Clear lung apices.

Other: Retropharyngeal course of the carotid arteries with moderate
calcified plaque.
IMPRESSION: 1. No evidence of acute intracranial abnormality.
2. Right periorbital hematoma extending into the cheek and frontal
scalp.
3. No acute maxillofacial or cervical spine fracture.

## 2021-03-28 IMAGING — CT CT MAXILLOFACIAL W/O CM
3 of 4 series · 14 of 47 positions shown, 16 images · non-contrast
Comparison: Head CT 10/27/2018.  Cervical spine CT 10/22/2015.

CLINICAL DATA: Trauma.

EXAM:
CT HEAD WITHOUT CONTRAST
CT MAXILLOFACIAL WITHOUT CONTRAST
CT CERVICAL SPINE WITHOUT CONTRAST
TECHNIQUE: Multidetector CT imaging of the head, cervical spine, and
maxillofacial structures were performed using the standard protocol
without intravenous contrast. Multiplanar CT image reconstructions
of the cervical spine and maxillofacial structures were also
generated.

[Series 3: facialbone 2.0 st · axial · 0.42mm/px · z∈[-168,-12]mm · 8 of 92 slices shown, 10 images]
[im 7/92  brain]
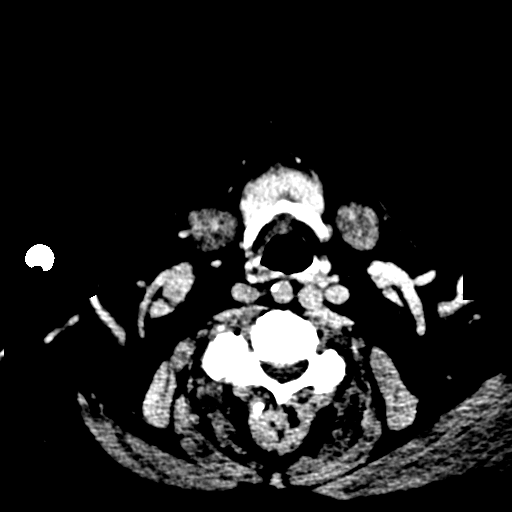
[im 7/92  bone]
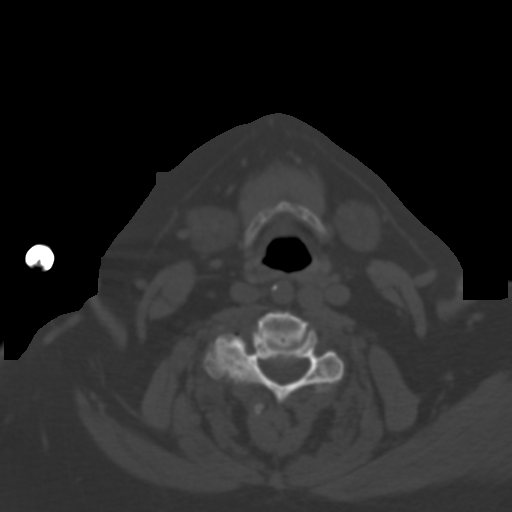
[im 19/92  bone]
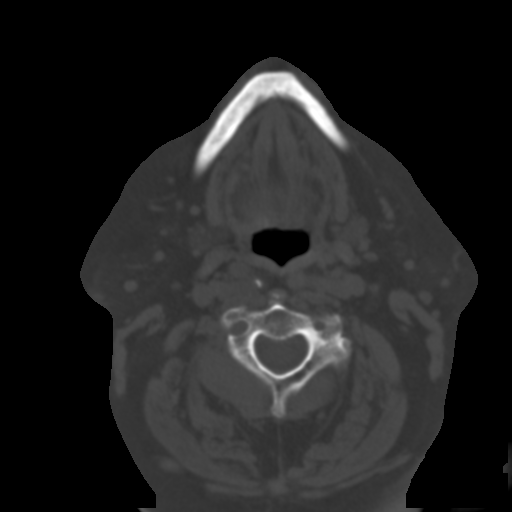
[im 29/92  bone]
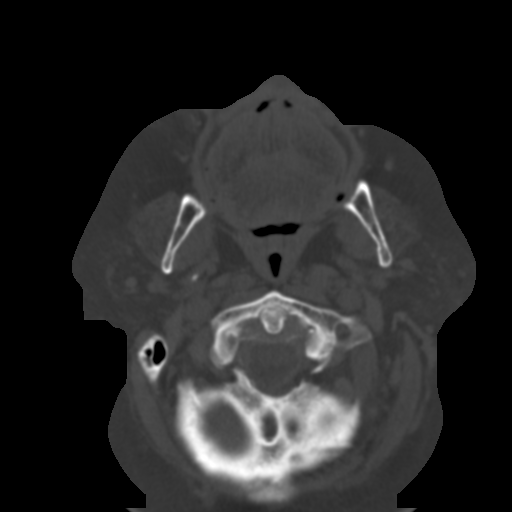
[im 41/92  bone]
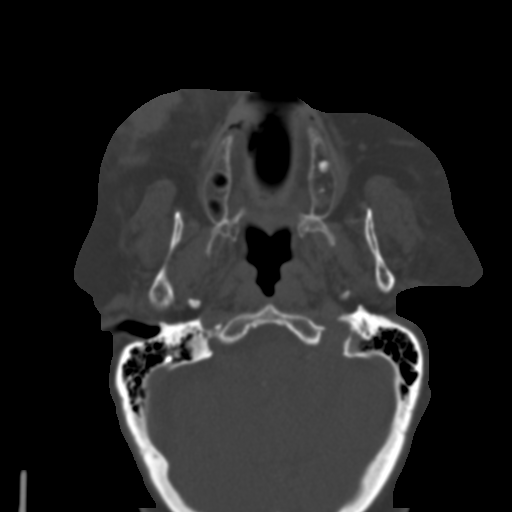
[im 51/92  brain]
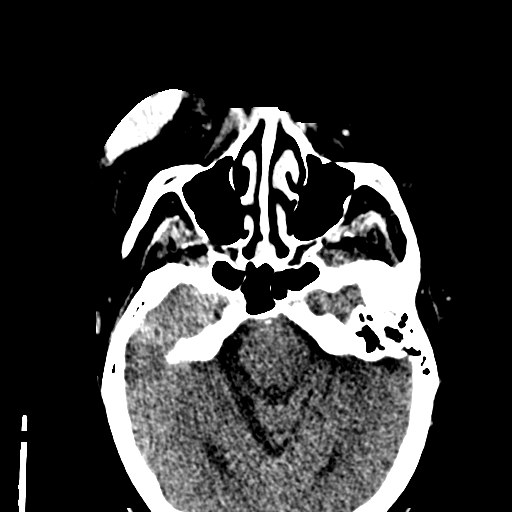
[im 51/92  bone]
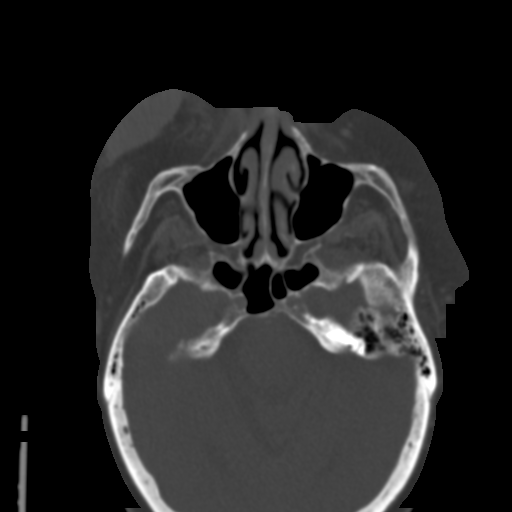
[im 63/92  bone]
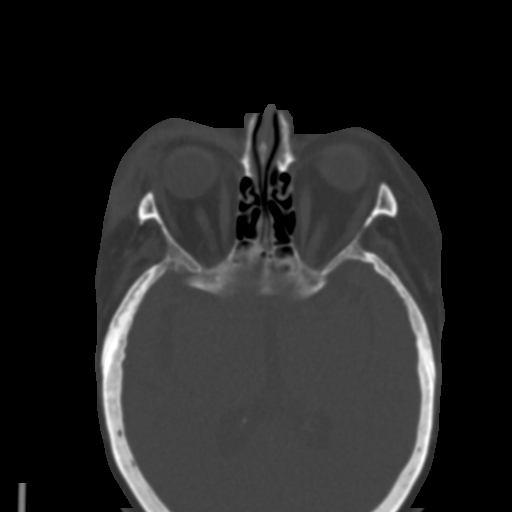
[im 73/92  bone]
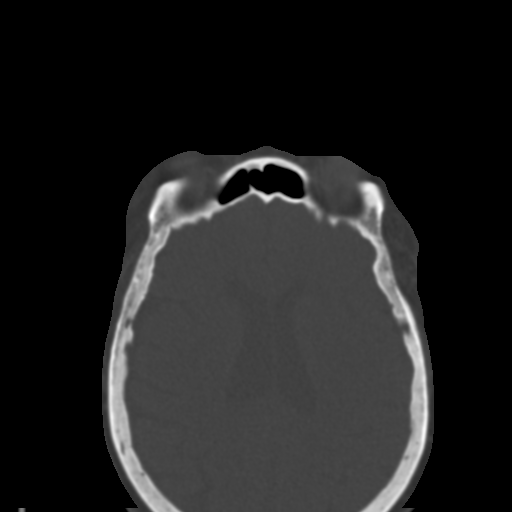
[im 85/92  bone]
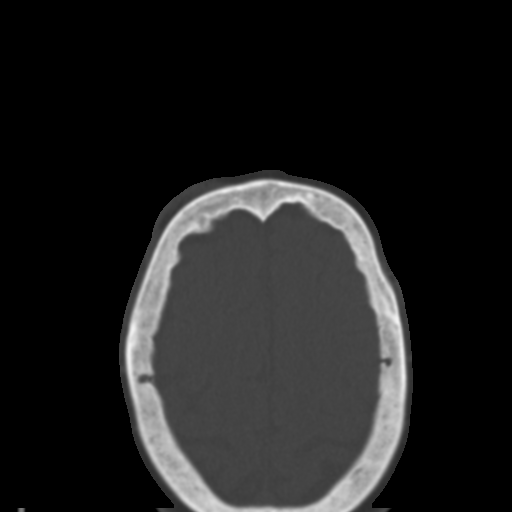

[Series 5: bone 2.0 cor · coronal · 0.37mm/px · 3 of 95 slices shown]
[im 24/95  bone]
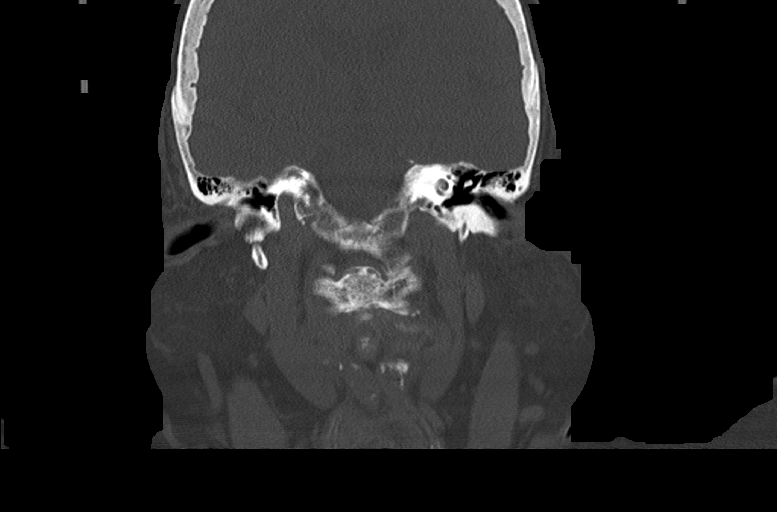
[im 48/95  bone]
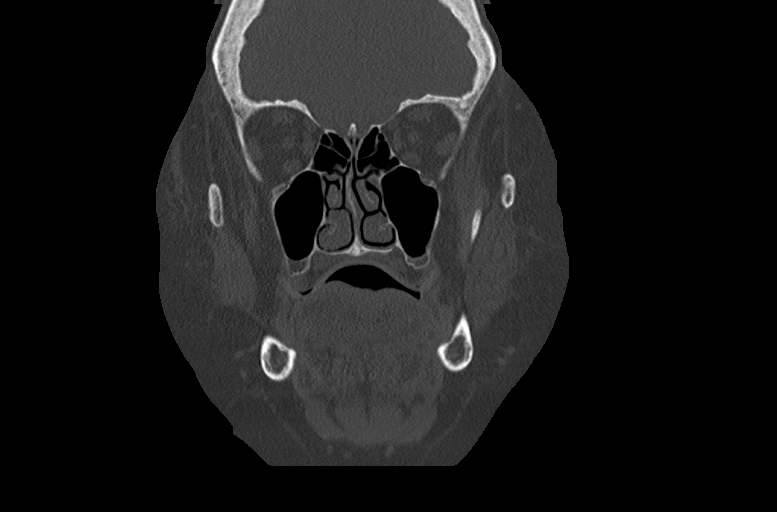
[im 71/95  bone]
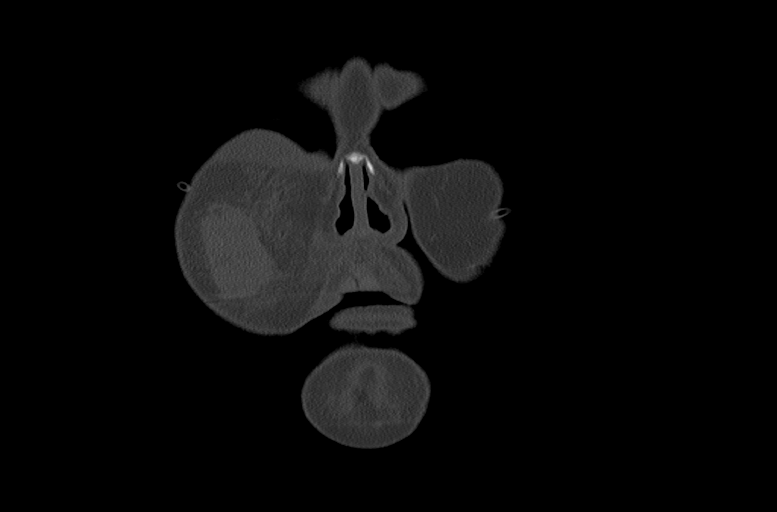

[Series 8: facialbone 2.0 sag st · sagittal · 0.38mm/px · 3 of 114 slices shown]
[im 38/114  bone]
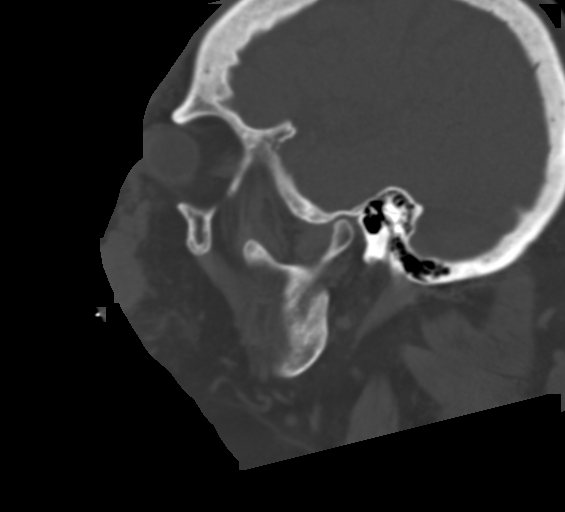
[im 57/114  bone]
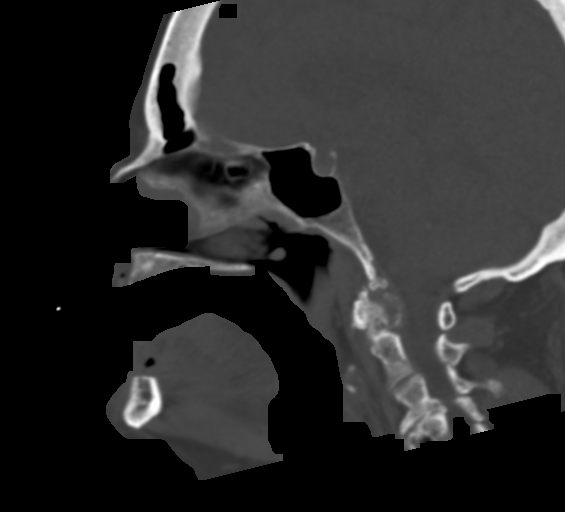
[im 76/114  bone]
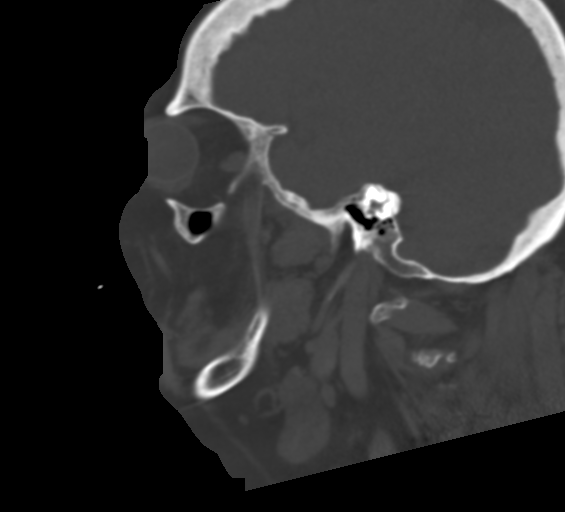

[14 of 47 positions shown; findings below may reference images not displayed]

FINDINGS: CT HEAD FINDINGS

Brain: There is no evidence of an acute infarct, intracranial
hemorrhage, mass, midline shift, or extra-axial fluid collection.
Mild cerebral atrophy is within normal limits for age. Hypodensities
in the cerebral white matter bilaterally are unchanged and
nonspecific but compatible with mild chronic small vessel ischemic
disease. There is an unchanged chronic lacunar infarct in the left
caudate nucleus.

Vascular: Calcified atherosclerosis at the skull base.

Skull: No fracture or suspicious osseous lesion.

Other: Right frontal scalp hematoma.

CT MAXILLOFACIAL FINDINGS

Osseous: No acute fracture, mandibular dislocation, or destructive
osseous process.

Orbits: Bilateral cataract extraction. Moderately large right
periorbital hematoma extending inferiorly into the right cheek. No
retro bulbar hematoma.

Sinuses: Paranasal sinuses and mastoid air cells are clear.

Soft tissues: No additional acute findings.

CT CERVICAL SPINE FINDINGS

Alignment: Chronic straightening of the normal cervical lordosis.
Minimal anterolisthesis of C7 on T1, likely degenerative.

Skull base and vertebrae: No acute fracture or suspicious osseous
lesion. C4-5 ACDF. Prominent median C1-2 arthropathy.

Soft tissues and spinal canal: No prevertebral fluid or swelling. No
visible canal hematoma.

Disc levels: Advanced disc degeneration at C5-6, C6-7, and T2-3.
Advanced facet arthrosis at multiple levels in the cervical and
included upper thoracic spine. Moderate spinal stenosis at C5-6 and
C6-7 due to broad-based posterior disc osteophyte complexes.

Upper chest: Clear lung apices.

Other: Retropharyngeal course of the carotid arteries with moderate
calcified plaque.
IMPRESSION: 1. No evidence of acute intracranial abnormality.
2. Right periorbital hematoma extending into the cheek and frontal
scalp.
3. No acute maxillofacial or cervical spine fracture.

## 2021-03-28 IMAGING — CT CT HEAD W/O CM
4 series · 16 of 47 positions shown, 18 images · non-contrast
Comparison: Head CT 10/27/2018.  Cervical spine CT 10/22/2015.

CLINICAL DATA: Trauma.

EXAM:
CT HEAD WITHOUT CONTRAST
CT MAXILLOFACIAL WITHOUT CONTRAST
CT CERVICAL SPINE WITHOUT CONTRAST
TECHNIQUE: Multidetector CT imaging of the head, cervical spine, and
maxillofacial structures were performed using the standard protocol
without intravenous contrast. Multiplanar CT image reconstructions
of the cervical spine and maxillofacial structures were also
generated.

[Series 3: head without · axial · non-contrast · 0.40mm/px · z∈[-118,+22]mm · 7 of 38 slices shown, 9 images]
[im 5/38  brain]
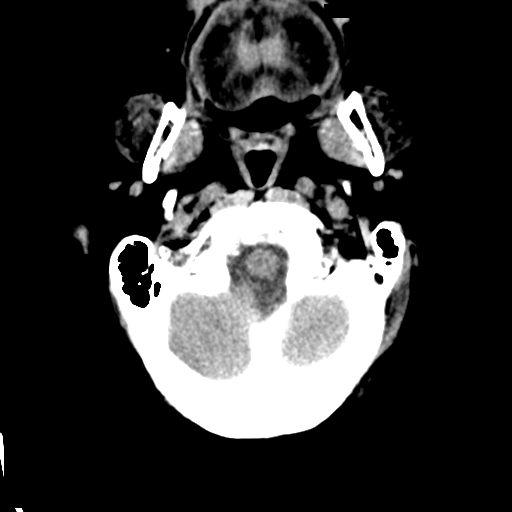
[im 5/38  bone]
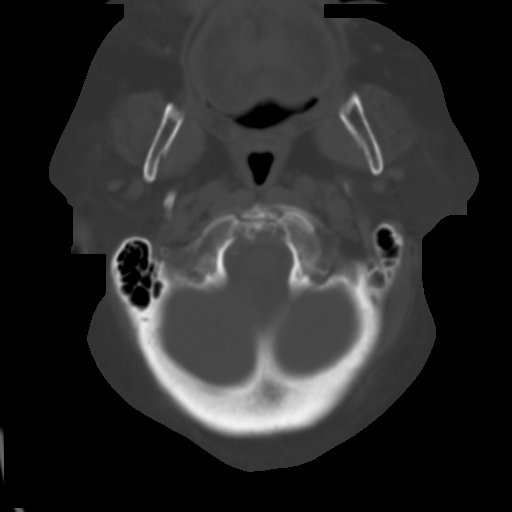
[im 10/38  brain]
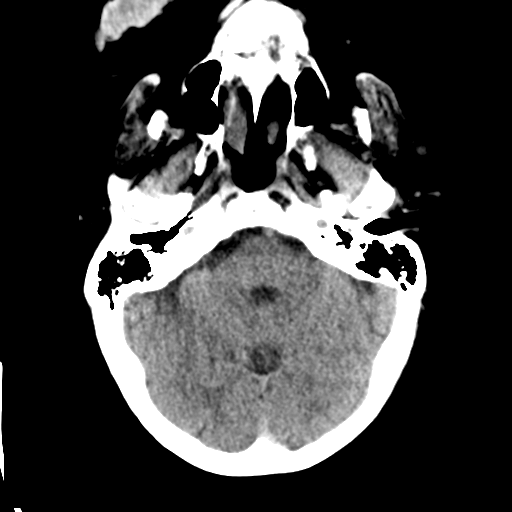
[im 14/38  brain]
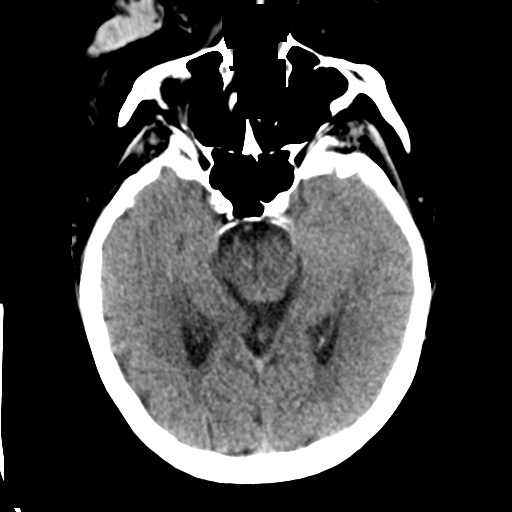
[im 19/38  brain]
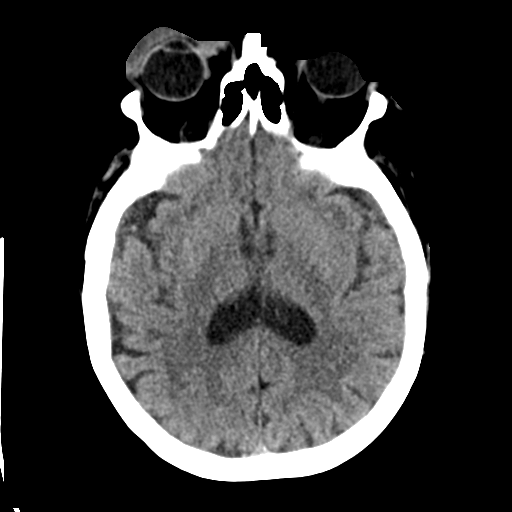
[im 24/38  brain]
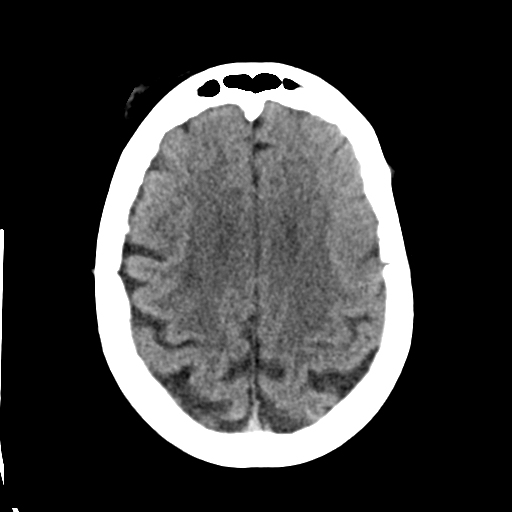
[im 24/38  bone]
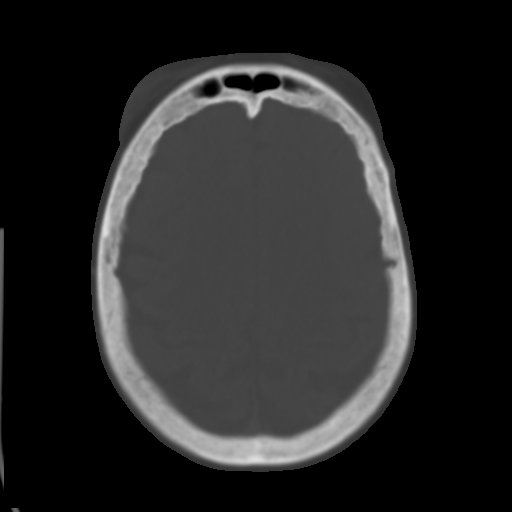
[im 28/38  brain]
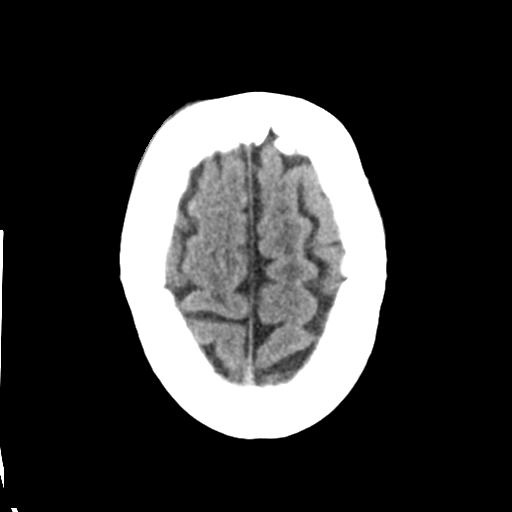
[im 33/38  brain]
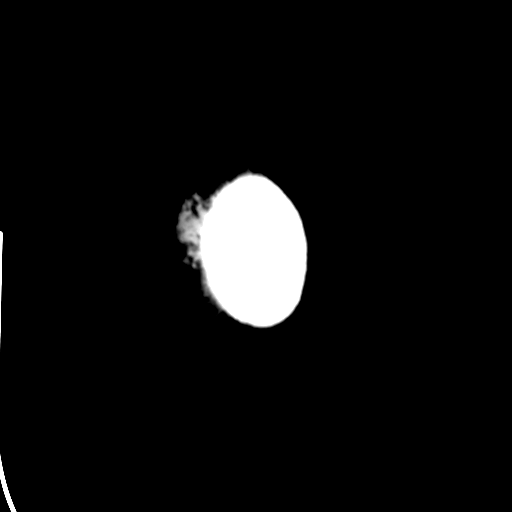

[Series 4: head bone · axial · 0.40mm/px · z∈[-120,-82]mm · 3 of 95 slices shown]
[im 10/95  bone]
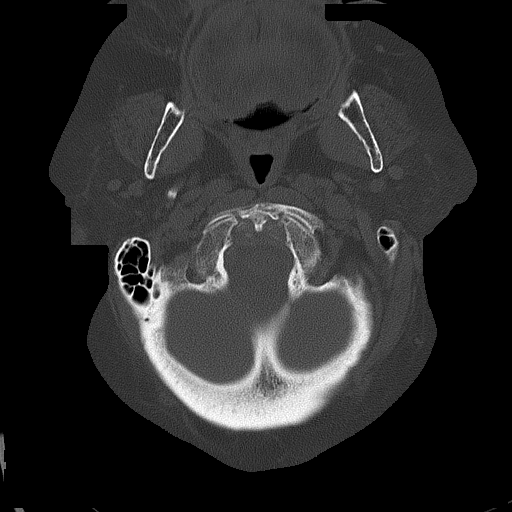
[im 19/95  bone]
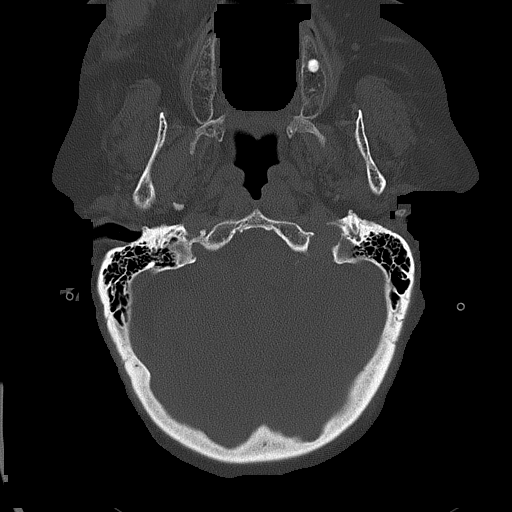
[im 29/95  bone]
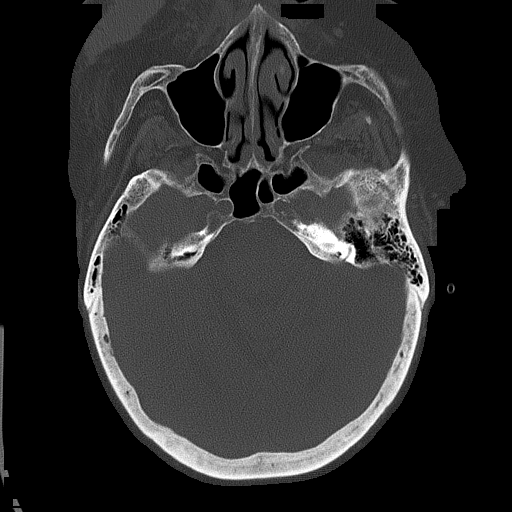

[Series 5: head without cor · coronal · non-contrast · 0.33mm/px · 3 of 67 slices shown]
[im 23/67  brain]
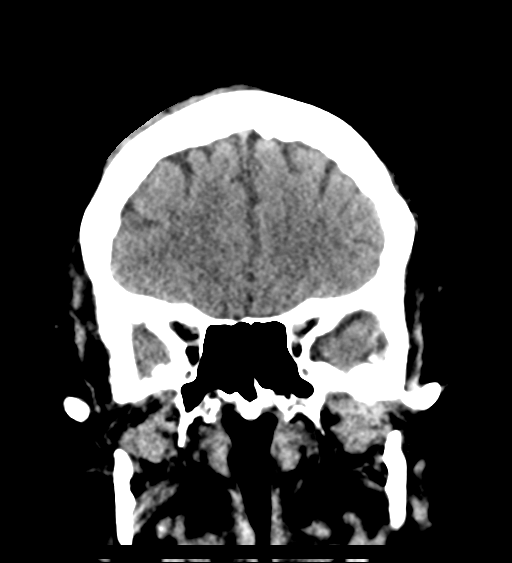
[im 30/67  brain]
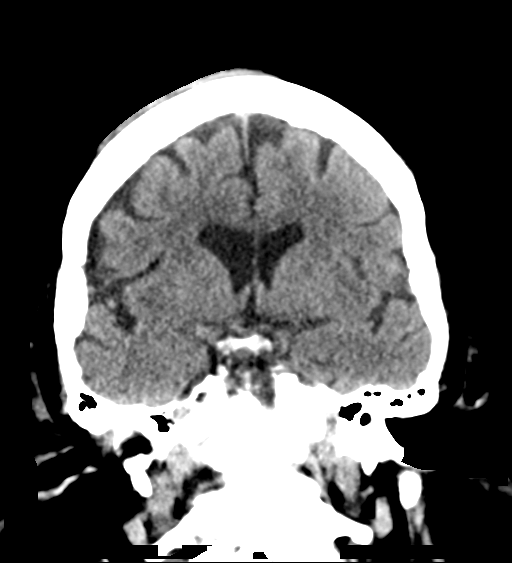
[im 37/67  brain]
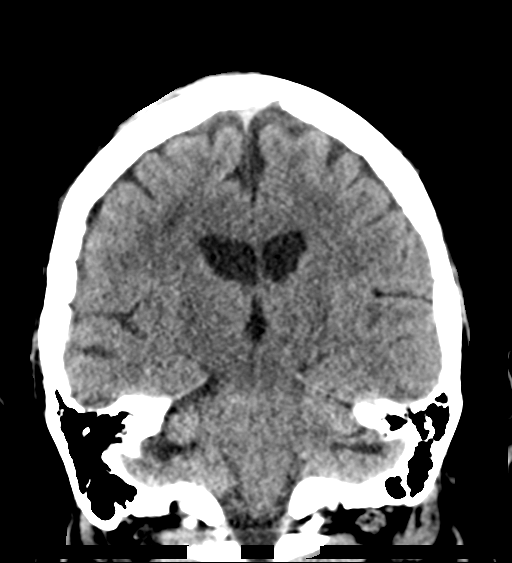

[Series 6: head without sag · sagittal · non-contrast · 0.37mm/px · 3 of 67 slices shown]
[im 23/67  brain]
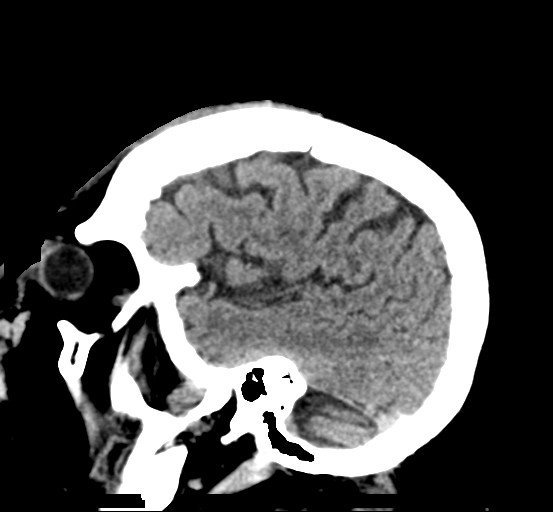
[im 34/67  brain]
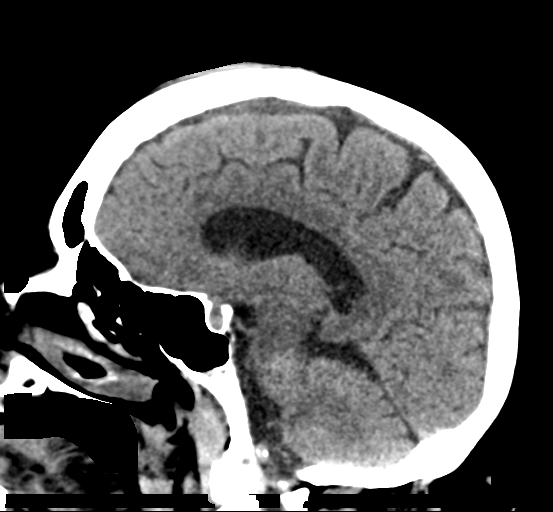
[im 45/67  brain]
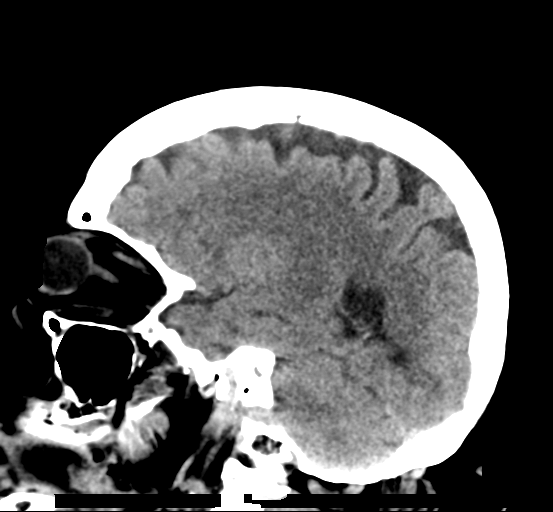

[16 of 47 positions shown; findings below may reference images not displayed]

FINDINGS: CT HEAD FINDINGS

Brain: There is no evidence of an acute infarct, intracranial
hemorrhage, mass, midline shift, or extra-axial fluid collection.
Mild cerebral atrophy is within normal limits for age. Hypodensities
in the cerebral white matter bilaterally are unchanged and
nonspecific but compatible with mild chronic small vessel ischemic
disease. There is an unchanged chronic lacunar infarct in the left
caudate nucleus.

Vascular: Calcified atherosclerosis at the skull base.

Skull: No fracture or suspicious osseous lesion.

Other: Right frontal scalp hematoma.

CT MAXILLOFACIAL FINDINGS

Osseous: No acute fracture, mandibular dislocation, or destructive
osseous process.

Orbits: Bilateral cataract extraction. Moderately large right
periorbital hematoma extending inferiorly into the right cheek. No
retro bulbar hematoma.

Sinuses: Paranasal sinuses and mastoid air cells are clear.

Soft tissues: No additional acute findings.

CT CERVICAL SPINE FINDINGS

Alignment: Chronic straightening of the normal cervical lordosis.
Minimal anterolisthesis of C7 on T1, likely degenerative.

Skull base and vertebrae: No acute fracture or suspicious osseous
lesion. C4-5 ACDF. Prominent median C1-2 arthropathy.

Soft tissues and spinal canal: No prevertebral fluid or swelling. No
visible canal hematoma.

Disc levels: Advanced disc degeneration at C5-6, C6-7, and T2-3.
Advanced facet arthrosis at multiple levels in the cervical and
included upper thoracic spine. Moderate spinal stenosis at C5-6 and
C6-7 due to broad-based posterior disc osteophyte complexes.

Upper chest: Clear lung apices.

Other: Retropharyngeal course of the carotid arteries with moderate
calcified plaque.
IMPRESSION: 1. No evidence of acute intracranial abnormality.
2. Right periorbital hematoma extending into the cheek and frontal
scalp.
3. No acute maxillofacial or cervical spine fracture.

## 2021-03-29 ENCOUNTER — Telehealth (HOSPITAL_COMMUNITY): Payer: Self-pay | Admitting: Cardiology

## 2021-03-29 DIAGNOSIS — I5032 Chronic diastolic (congestive) heart failure: Secondary | ICD-10-CM

## 2021-03-29 MED ORDER — TORSEMIDE 20 MG PO TABS: 60.0000 mg | ORAL_TABLET | Freq: Two times a day (BID) | ORAL | 3 refills | Status: DC

## 2021-03-29 NOTE — Telephone Encounter (Signed)
-----   Message from Rafael Bihari, [place] sent at 03/20/2021  4:06 PM EST ----- BNP elevated, increase torsemide to 60 mg daily. Repeat BMET and BNP in 10-14 days.

## 2021-03-29 NOTE — Telephone Encounter (Signed)
Patient called.  Patient aware.  

## 2021-03-30 DIAGNOSIS — Z7689 Persons encountering health services in other specified circumstances: Secondary | ICD-10-CM | POA: Diagnosis not present

## 2021-04-01 ENCOUNTER — Other Ambulatory Visit: Payer: Self-pay

## 2021-04-01 ENCOUNTER — Encounter: Payer: Self-pay | Admitting: Cardiovascular Disease

## 2021-04-01 ENCOUNTER — Ambulatory Visit (INDEPENDENT_AMBULATORY_CARE_PROVIDER_SITE_OTHER): Payer: Medicare Other | Admitting: Cardiovascular Disease

## 2021-04-01 VITALS — BP 124/74 | HR 65 | Ht 65.0 in | Wt 227.0 lb

## 2021-04-01 DIAGNOSIS — I442 Atrioventricular block, complete: Secondary | ICD-10-CM

## 2021-04-01 DIAGNOSIS — I5032 Chronic diastolic (congestive) heart failure: Secondary | ICD-10-CM | POA: Diagnosis not present

## 2021-04-01 DIAGNOSIS — I25118 Atherosclerotic heart disease of native coronary artery with other forms of angina pectoris: Secondary | ICD-10-CM | POA: Diagnosis not present

## 2021-04-01 DIAGNOSIS — E782 Mixed hyperlipidemia: Secondary | ICD-10-CM | POA: Diagnosis not present

## 2021-04-01 DIAGNOSIS — E1122 Type 2 diabetes mellitus with diabetic chronic kidney disease: Secondary | ICD-10-CM | POA: Diagnosis not present

## 2021-04-01 DIAGNOSIS — Z950 Presence of cardiac pacemaker: Secondary | ICD-10-CM | POA: Diagnosis not present

## 2021-04-01 DIAGNOSIS — N1831 Chronic kidney disease, stage 3a: Secondary | ICD-10-CM

## 2021-04-01 DIAGNOSIS — E662 Morbid (severe) obesity with alveolar hypoventilation: Secondary | ICD-10-CM

## 2021-04-01 DIAGNOSIS — G4733 Obstructive sleep apnea (adult) (pediatric): Secondary | ICD-10-CM | POA: Diagnosis not present

## 2021-04-01 MED ORDER — TORSEMIDE 40 MG PO TABS: 80.0000 mg | ORAL_TABLET | Freq: Two times a day (BID) | ORAL | 5 refills | Status: DC

## 2021-04-01 NOTE — Patient Instructions (Signed)
Medication Instructions:  INCREASE the Torsemide to 80 mg twice a day. This will be two of the 40 mg tablets (2 tablets twice day) *If you need a refill on your cardiac medications before your next appointment, please call your pharmacy*   Lab Work: None ordered If you have labs (blood work) drawn today and your tests are completely normal, you will receive your results only by: [place] (if you have MyChart) OR A paper copy in the mail If you have any lab test that is abnormal or we need to change your treatment, we will call you to review the results.   Testing/Procedures: None ordered   Follow-Up: At [facility], you and your health needs are our priority.  As part of our continuing mission to provide you with exceptional heart care, we have created designated Provider Care Teams.  These Care Teams include your primary Cardiologist (physician) and Advanced Practice Providers (APPs -  Physician Assistants and Nurse Practitioners) who all work together to provide you with the care you need, when you need it.  We recommend signing up for the patient portal called "MyChart".  Sign up information is provided on this After Visit Summary.  MyChart is used to connect with patients for Virtual Visits (Telemedicine).  Patients are able to view lab/test results, encounter notes, upcoming appointments, etc.  Non-urgent messages can be sent to your provider as well.   To learn more about what you can do with MyChart, go to [email or web address].    Your next appointment:   6 month(s)  The format for your next appointment:   In Person  Provider:   Sanda Klein, MD {

## 2021-04-01 NOTE — Progress Notes (Signed)
Patient ID: [name], female   DOB: [birth year], 76 y.o.   MRN: [id number] .    Cardiology Office Note    Date:  04/02/2021   ID:  [name], DOB 1945/06/27, MRN [id number]  PCP:  Alicia Amel, MD  Cardiologist:   Alicia Klein, MD   No chief complaint on file.    History of Present Illness:  [name] is a 75 y.o. female with CAD s/p CABG October 2016 (LIMA to LAD, SVG to D1, SVG to OM1 and OM2, SVG to PDA), diastolic heart failure, morbid obesity, adrenal insufficiency, type 2 diabetes mellitus, Hypertension, stage III chronic kidney disease, gout, obstructive sleep apnea, asthma. Nonischemic cardiomyopathy and heart failure preceded the diagnosis of coronary disease. She has complete heart block and had a conventional pacemaker implanted in 2001. This was upgraded to a biventricular ICD implanted in 2008 for severe cardiomyopathy with ejection fraction 20%, downgraded to CRT-P in 2014 ([place]) following excellent response to resynchronization (hyper-responder, normalization of left ventricular systolic function). The current atrial and right ventricular leads were implanted in 2001, the left ventricular lead was implanted in 2008. The defibrillator lead implanted in 2008 is capped and its tip was resected at the time of bypass surgery. Ever since bypass surgery, atrial pacing thresholds have been extremely high, so her device is programmed VDD.   She underwent  elective pacemaker generator change out on December 31, 2020.  She was hospitalized just a week later with massive volume overload and underwent diuresis of 4.5 L of excess volume.  Discharge weight was 230 pounds.  She was seen in the emergency room on January 4 with cellulitis of the lower extremity.  She was last seen in the heart failure clinic on 03/18/2021. Her renal function showed surprising improvement in her creatinine was down to 1.58 last month, 1.55 on 03/18/2021.  Her nephrologist is Dr.  Vanetta [name].  Several days ago she complained of worsening shortness of breath and edema.  BNP was found to be elevated and her torsemide was increased to 60 mg daily.  She has had only mild improvement.  She is scheduled for repeat metabolic panel and BNP at the end of the month.  She does not have dyspnea at rest, but becomes easily short of breath with even minimal activity.  She complains of some discomfort underneath her left shoulder blade.  This is not clearly pleuritic or anginal in its pattern.  She has persistent 1-2+ pedal edema and worries about some redness overlying her right foot and ankle area.  Her weight today is 227 pounds, comparable to what her home scale says.  Device interrogation shows that her current biventricular pacemaker which is a [employer], advertising to implanted 12/31/2020 has 9.5 years of estimated longevity.  She has 99% biventricular pacing and has not had any atrial fibrillation since device implantation.  She has a good heart rate histogram distribution.  She had a TIA (left hemiparesis and facial droop 09/16/2020)  and received a right internal carotid stent (TCAR) by Dr. Monica Martinez on October 01, 2020. Echo 08/08 showed LVEF 55-60%, E/A was <1, but E/e' was elevated.   On presentation today her ECG showed left atrial sensed, ventricular paced rhythm with a very broad paced QRS at 182 ms and a QS pattern in lead V1, consistent with RV apical pacing alone.  Only one of the paced beats suggested some degree of resynchronization.  After increasing the output on her LV lead,  the EKG shows a much narrower QRS complex at 124 ms duration, with a distinct positive R wave in lead V1.  Had symptoms of heart failure again in the fall of 2020 and echocardiogram 01/18/2019 showed EF had deteriorated, down to 40-45% (had been normal in 2017) and left atrial pressures were increased.  On 12/17 she was seen in the clinic and it was noted that she had lost left  ventricular capture.  The output was increased in the coronary sinus lead.    Right and left heart catheterization with Dr. Haroldine Laws on 03/11/2019.  Coronary anatomy was unchanged, with known occlusion of the SVG to OM but with other bypasses patent and with patent stents in the  native left circumflex coronary artery and ramus intermedius.  Left heart filling pressures were perfectly compensated with a wedge pressure of 14, but there was still evidence of mild pulmonary hypertension (PA 34/17, mean 24 mmHg, PVR 1.5, normal cardiac output with an index of 3.1 L/minute/m sq).   Past Medical History:  Diagnosis Date   ABDOMINAL PAIN -GENERALIZED 02/21/2010   Qualifier: Diagnosis of  By: Chester Holstein NP, Nevin Bloodgood     AICD (automatic cardioverter/defibrillator) present    downgraded to CRT-P in 2014   Anemia    Arthritis    Asthma    Atrial fibrillation ([place]) 10/24/2015   Questionable history of A Fib/A Flutter. On device interrogation on 9/7, showed 0.5min of A Fib/A Flutter with 1% burden.  Device check in Jan 2018 showed no sustained AF (runs of less than 30 seconds). No anticoagulation indicated.   CAD (coronary artery disease)    a. 10/09/16 LHC: SVG->LAD patent, SVG->Diag patent, SVG->RCA patent, SVG->LCx occluded. EF 60%, b. 10/31/16 LHC DES to AV groove Circ, DES to intermed branch   Cellulitis and abscess of foot 12/2014   RT FOOT   CHF (congestive heart failure) (HCC)    Chronic bronchitis (HCC)    Chronic lower back pain    Complete heart block ([place]) 06/22/2015   Diverticulosis    Facial numbness 02/2016   Fatty liver disease, nonalcoholic    Gastritis    GERD (gastroesophageal reflux disease)    Gout    H/O hiatal hernia    HEMORRHOIDS-EXTERNAL 02/21/2010   Qualifier: Diagnosis of  By: Chester Holstein NP, Paula     High cholesterol    Hyperplastic colon polyp    Hypertension    IBS (irritable bowel syndrome)    Internal hemorrhoids    INTERNAL HEMORRHOIDS WITHOUT MENTION COMP 04/12/2007    Qualifier: Diagnosis of  By: Olevia Perches MD, Dora M    Nonischemic cardiomyopathy ([place]) 11/22/2011   Pt responded to BiV ICD- last EF 55-60% Sept 2017   Obesity    On home oxygen therapy    "2L at night" (10/31/2016)   OSA (obstructive sleep apnea)    "can't tolerate a mask" (10/30/2016)   PERSONAL HX COLONIC POLYPS 02/21/2010   Qualifier: Diagnosis of  By: Chester Holstein NP, Paula     Pneumonia    "couple times" (10/31/2016)   Presence of permanent cardiac pacemaker    11/02/12 [employer] V273 INTUA PPM   Right facial numbness 03/05/2016   Shortness of breath    Stroke ([place]) 09/2020   TIA (transient ischemic attack)    "recently" (10/31/2016)   Type II diabetes mellitus ([place])     Past Surgical History:  Procedure Laterality Date   ABDOMINAL ULTRASOUND  12/01/2011   Peripelvic cysts- #1- 2.4x1.9x2.3cm, #2-1.2x0.9x1.2cm   ANKLE  FRACTURE SURGERY Right    "had rod put in"   ANTERIOR CERVICAL DECOMP/DISCECTOMY FUSION     APPENDECTOMY     BACK SURGERY     BIOPSY  12/29/2017   Procedure: BIOPSY;  Surgeon: [name], MD;  [facility]: [facility];  Service: Endoscopy;;   BIV ICD INSERTION CRT-D  2001?   BIV PACEMAKER GENERATOR CHANGE OUT N/A 11/02/2012   Procedure: BIV PACEMAKER GENERATOR CHANGE OUT;  Surgeon: Alicia Klein, MD;  Location: [facility];  Service: Cardiovascular;  Laterality: N/A;   CARDIAC CATHETERIZATION  05/17/1999   No significant coronary obstructive disease w/ mild 20% luminal irregularity of the first diag branch of the LAD   CARDIAC CATHETERIZATION  07/08/2002   No significant CAD, moderately depressed LV systolic function   CARDIAC CATHETERIZATION Bilateral 04/26/2007   Normal findings, recommend medical therapy   CARDIAC CATHETERIZATION  02/18/2008   Moderate CAD, would benefit from having a functional study, recommend continue medical therapy   CARDIAC CATHETERIZATION  07/23/2012   Medical therapy   CARDIAC CATHETERIZATION N/A 11/24/2014   Procedure: Left  Heart Cath and Coronary Angiography;  Surgeon: Troy Sine, MD;  Location: [facility];  Service: Cardiovascular;  Laterality: N/A;   CARDIAC CATHETERIZATION  11/27/2014   Procedure: Intravascular Pressure Wire/FFR Study;  Surgeon: Peter M Martinique, MD;  Location: [facility];  Service: Cardiovascular;;   CARDIAC CATHETERIZATION  10/09/2016   CHOLECYSTECTOMY N/A 04/09/2018   Procedure: LAPAROSCOPIC CHOLECYSTECTOMY;  Surgeon: Greer Pickerel, MD;  [facility];  Service: General;  Laterality: N/A;   COLONOSCOPY WITH PROPOFOL N/A 12/29/2017   Procedure: COLONOSCOPY WITH PROPOFOL;  Surgeon: [name], MD;  [facility];  Service: Endoscopy;  Laterality: N/A;   CORONARY ANGIOGRAM  2010   CORONARY ARTERY BYPASS GRAFT N/A 11/29/2014   Procedure: CORONARY ARTERY BYPASS GRAFTING x 5 (LIMA-LAD, SVG-D, SVG-OM1-OM2, SVG-PD);  Surgeon: Melrose Nakayama, MD;  Location: [facility];  Service: Open Heart Surgery;  Laterality: N/A;   CORONARY STENT INTERVENTION N/A 10/31/2016   Procedure: CORONARY STENT INTERVENTION;  Surgeon: Burnell Blanks, MD;  Location: [facility];  Service: Cardiovascular;  Laterality: N/A;   ESOPHAGOGASTRODUODENOSCOPY (EGD) WITH PROPOFOL N/A 12/29/2017   Procedure: ESOPHAGOGASTRODUODENOSCOPY (EGD) WITH PROPOFOL;  Surgeon: [name], MD;  [facility];  Service: Endoscopy;  Laterality: N/A;   FRACTURE SURGERY     INSERT / REPLACE / [place] ARTHROSCOPY Bilateral    LEFT HEART CATH AND CORS/GRAFTS ANGIOGRAPHY N/A 12/09/2017   Procedure: LEFT HEART CATH AND CORS/GRAFTS ANGIOGRAPHY;  Surgeon: Troy Sine, MD;  Location: [facility];  Service: Cardiovascular;  Laterality: N/A;   LEFT HEART CATHETERIZATION WITH CORONARY ANGIOGRAM N/A 07/23/2012   Procedure: LEFT HEART CATHETERIZATION WITH CORONARY ANGIOGRAM;  Surgeon: Leonie Man, MD;  [facility];  Service: Cardiovascular;   Laterality: N/A;   LEXISCAN MYOVIEW  11/14/2011   Mild-moderate defect seen in Mid Inferolateral and Mid Anterolateral regions-consistant w/ infarct/scar. No significant ischemia demonstrated.   POLYPECTOMY  12/29/2017   Procedure: POLYPECTOMY;  Surgeon: [name], MD;  Location: [facility];  Service: Endoscopy;;   PPM GENERATOR CHANGEOUT N/A 12/31/2020   Procedure: PPM GENERATOR CHANGEOUT;  Surgeon: Alicia Klein, MD;  Location: [facility];  Service: Cardiovascular;  Laterality: N/A;   RIGHT/LEFT HEART CATH AND CORONARY ANGIOGRAPHY N/A 10/09/2016   Procedure: RIGHT/LEFT HEART CATH AND CORONARY ANGIOGRAPHY;  Surgeon:  Bensimhon, Shaune Pascal, MD;  Location: [facility];  Service: Cardiovascular;  Laterality: N/A;   RIGHT/LEFT HEART CATH AND CORONARY/GRAFT ANGIOGRAPHY N/A 03/11/2019   Procedure: RIGHT/LEFT HEART CATH AND CORONARY/GRAFT ANGIOGRAPHY;  Surgeon: Jolaine Artist, MD;  Location: [facility];  Service: Cardiovascular;  Laterality: N/A;   TEE WITHOUT CARDIOVERSION N/A 11/29/2014   Procedure: TRANSESOPHAGEAL ECHOCARDIOGRAM (TEE);  Surgeon: Melrose Nakayama, MD;  Location: [facility];  Service: Open Heart Surgery;  Laterality: N/A;   TRANSCAROTID ARTERY REVASCULARIZATION  Right 10/01/2020   Procedure: RIGHT TRANSCAROTID ARTERY REVASCULARIZATION;  Surgeon: [name], MD;  Location: [facility];  Service: Vascular;  Laterality: Right;   TRANSTHORACIC ECHOCARDIOGRAM  07/23/2012   EF 55-60%, normal-mild   TUBAL LIGATION     ULTRASOUND GUIDANCE FOR VASCULAR ACCESS Left 10/01/2020   Procedure: ULTRASOUND GUIDANCE FOR VASCULAR ACCESS;  Surgeon: [name], MD;  Location: [facility];  Service: Vascular;  Laterality: Left;    Current Medications: Outpatient Medications Prior to Visit  Medication Sig Dispense Refill   ACCU-CHEK AVIVA PLUS test strip Check blood sugar twice daily     Accu-Chek Softclix Lancets lancets Check blood sugar twice daily      acetaminophen (TYLENOL) 500 MG tablet Take 1,000 mg by mouth 2 (two) times daily as needed (for pain or headaches).     albuterol (PROVENTIL HFA;VENTOLIN HFA) 108 (90 Base) MCG/ACT inhaler Inhale 2 puffs into the lungs every 6 (six) hours as needed for wheezing or shortness of breath. 1 Inhaler 0   Alcohol Swabs (B-D SINGLE USE SWABS REGULAR) PADS      allopurinol (ZYLOPRIM) 100 MG tablet Take 1 tablet (100 mg total) by mouth daily. 90 tablet 3   aspirin EC 81 MG tablet Take 1 tablet (81 mg total) by mouth daily. 90 tablet 3   bisoprolol (ZEBETA) 5 MG tablet Take 1.5 tablets (7.5 mg total) by mouth daily. 45 tablet 4   budesonide-formoterol (SYMBICORT) 160-4.5 MCG/ACT inhaler Inhale 2 puffs into the lungs 2 (two) times daily.     carboxymethylcellulose (REFRESH PLUS) 0.5 % SOLN Place 1 drop into both eyes every 12 (twelve) hours.     clopidogrel (PLAVIX) 75 MG tablet Take 1 tablet (75 mg total) by mouth daily. 90 tablet 3   Docusate Calcium (STOOL SOFTENER PO) Take 1 tablet by mouth at bedtime.     empagliflozin (JARDIANCE) 10 MG TABS tablet Take 10 mg by mouth daily before breakfast. 90 tablet 3   ferrous sulfate 325 (65 FE) MG tablet Take 325 mg by mouth daily with breakfast.     fluconazole (DIFLUCAN) 150 MG tablet Take 1 tablet (150 mg total) by mouth daily. 1 tablet 0   guaiFENesin (MUCINEX) 600 MG 12 hr tablet Take 1 tablet (600 mg total) by mouth 2 (two) times daily. 30 tablet 1   ipratropium (ATROVENT) 0.02 % nebulizer solution Take 2.5 mLs (0.5 mg total) by nebulization 4 (four) times daily. (Patient taking differently: Take 0.5 mg by nebulization every 4 (four) hours as needed (Asthma).) 75 mL 0   isosorbide mononitrate (IMDUR) 30 MG 24 hr tablet Take 90 mg by mouth daily.     loratadine (CLARITIN) 10 MG tablet Take 10 mg by mouth daily.     OXYGEN Inhale 3 L/min into the lungs continuous.      pantoprazole (PROTONIX) 40 MG tablet Take 1 tablet (40 mg total) by mouth 2 (two) times daily.  180 tablet 3   polyethylene glycol (  MIRALAX / GLYCOLAX) 17 g packet Take 17 g by mouth daily.     ranolazine (RANEXA) 1000 MG SR tablet TAKE 1 TABLET BY MOUTH  TWICE DAILY 180 tablet 1   RESTASIS 0.05 % ophthalmic emulsion 1 drop daily as needed.     rosuvastatin (CRESTOR) 40 MG tablet Take 1 tablet (40 mg total) by mouth every evening. NEED OV. 90 tablet 2   torsemide (DEMADEX) 20 MG tablet Take 3 tablets (60 mg total) by mouth 2 (two) times daily. 90 tablet 3   albuterol (PROVENTIL) (2.5 MG/3ML) 0.083% nebulizer solution Take 3 mLs (2.5 mg total) by nebulization every 6 (six) hours as needed for wheezing or shortness of breath. (Patient not taking: Reported on 04/01/2021) 75 mL 0   cyclobenzaprine (FLEXERIL) 10 MG tablet Take 5 mg by mouth 2 (two) times daily as needed for muscle spasms. (Patient not taking: Reported on 04/01/2021)     EPINEPHrine 0.3 mg/0.3 mL IJ SOAJ injection Inject 0.3 mg into the muscle once as needed for anaphylaxis (severe allergic reaction). (Patient not taking: Reported on 04/01/2021)     fluticasone (FLONASE) 50 MCG/ACT nasal spray Place 1 spray into both nostrils daily as needed. (Patient not taking: Reported on 04/01/2021)     ipratropium (ATROVENT) 0.06 % nasal spray Place into both nostrils. (Patient not taking: Reported on 04/01/2021)     nitroGLYCERIN (NITROSTAT) 0.4 MG SL tablet Place 1 tablet (0.4 mg total) under the tongue every 5 (five) minutes as needed for chest pain. (Patient not taking: Reported on 04/01/2021) 25 tablet 3   nystatin cream (MYCOSTATIN) Apply 1 application topically 2 (two) times daily as needed (rash). (Patient not taking: Reported on 04/01/2021)     potassium chloride SA (KLOR-CON M) 20 MEQ tablet Take 1 tablet (20 mEq total) by mouth every morning. (Patient not taking: Reported on 04/01/2021) 30 tablet 0   predniSONE (DELTASONE) 5 MG tablet Take by mouth. (Patient not taking: Reported on 04/01/2021)     sucralfate (CARAFATE) 1 g tablet Take 1 tablet  (1 g total) by mouth in the morning, at noon, and at bedtime. As needed (Patient not taking: Reported on 04/01/2021) 90 tablet 3   No facility-administered medications prior to visit.     Allergies:   Ivp dye [iodinated contrast media], Shellfish allergy, Sulfa antibiotics, Iodine, Atorvastatin, Benadryl [diphenhydramine], Colchicine, Contrast media [iodinated contrast media], Doxycycline, Uloric [febuxostat], Cephalexin, and Zithromax [azithromycin]   Social History   Socioeconomic History   Marital status: Widowed    Spouse name: Kimyetta Flott- deceased   Number of children: 5   Years of education: Not on file   Highest education level: Not on file  Occupational History   Occupation: STAFF/BUFFET    Employer: [employer]  Tobacco Use   Smoking status: Former    Packs/day: 0.25    Years: 3.00    Pack years: 0.75    Types: Cigarettes    Quit date: 02/11/1968    Years since quitting: 53.1   Smokeless tobacco: Never  Vaping Use   Vaping Use: Never used  Substance and Sexual Activity   Alcohol use: No   Drug use: No   Sexual activity: Never  Other Topics Concern   Not on file  Social History Narrative   Widowed last year. Spouse Mike Gip Sr (83) died on date 2012-02-09 26-May-2022) at his home Was a [place] [employer] -[place] [employer]   She lives with her two sons, Thayer Ohm  Other sons Haze Rushing   Social Determinants of Health   Financial Resource Strain: Not on file  Food Insecurity: No Food Insecurity   Worried About Charity fundraiser in the Last Year: Never true   Arboriculturist in the Last Year: Never true  Transportation Needs: No Transportation Needs   Lack of Transportation (Medical): No   Lack of Transportation (Non-Medical): No  Physical Activity: Not on file  Stress: Not on file  Social Connections: Moderately Integrated   Frequency of Communication with Friends and Family: Three times a week   Frequency of Social  Gatherings with Friends and Family: Three times a week   Attends Religious Services: 1 to 4 times per year   Active Member of Clubs or Organizations: Yes   Attends Archivist Meetings: 1 to 4 times per year   Marital Status: Widowed     Family History:  The patient's family history includes Asthma in her sister; Breast cancer in her mother; Diabetes in her maternal grandmother and mother; Glaucoma in her maternal aunt; Heart disease in her maternal aunt and maternal grandmother; Kidney disease in her maternal grandmother.   ROS:   Please see the history of present illness.    ROS All other systems are reviewed and are negative.   PHYSICAL EXAM:   VS:  BP 124/74 (BP Location: Left Arm, Patient Position: Sitting, Cuff Size: Normal)    Pulse 65    Ht 5\' 5"  (1.651 m)    Wt 227 lb (103 kg)    SpO2 99%    BMI 37.77 kg/m       General: Alert, oriented x3, no distress, severely obese.  Wearing oxygen.  Well-healed scar at her latest pacemaker change out in the right subclavian area (she has 2 additional scars there). Head: no evidence of trauma, PERRL, EOMI, no exophtalmos or lid lag, no myxedema, no xanthelasma; normal ears, nose and oropharynx Neck: normal jugular venous pulsations and no hepatojugular reflux; brisk carotid pulses without delay and no carotid bruits Chest: clear to auscultation, no signs of consolidation by percussion or palpation, normal fremitus, symmetrical and full respiratory excursions Cardiovascular: normal position and quality of the apical impulse, regular rhythm, normal first and second heart sounds, no murmurs, rubs or gallops Abdomen: no tenderness or distention, no masses by palpation, no abnormal pulsatility or arterial bruits, normal bowel sounds, no hepatosplenomegaly Extremities: no clubbing, cyanosis or edema; 2+ radial, ulnar and brachial pulses bilaterally; 2+ right femoral, posterior tibial and dorsalis pedis pulses; 2+ left femoral, posterior  tibial and dorsalis pedis pulses; no subclavian or femoral bruits Neurological: grossly nonfocal Psych: Normal mood and affect     Wt Readings from Last 3 Encounters:  04/01/21 227 lb (103 kg)  03/18/21 228 lb 6.4 oz (103.6 kg)  02/13/21 229 lb (103.9 kg)      Studies/Labs Reviewed:   EKG:  EKG is not ordered today.  Personally reviewed the tracing from 03/18/2021 which shows atrial sensed (sinus), ventricular sensed rhythm with prominent positive R wave in lead V1 consistent with effective LV capture.   Recent Labs: 01/07/2021: ALT 14 02/14/2021: Hemoglobin 11.9; Platelets 171 03/18/2021: B Natriuretic Peptide 721.2; BUN 22; Creatinine, Ser 1.55; Potassium 4.0; Sodium 137   Lipid Panel    Component Value Date/Time   CHOL 169 10/02/2020 0415   CHOL 186 01/21/2019 1045   TRIG 124 10/02/2020 0415   HDL 50 10/02/2020 0415   HDL 44 01/21/2019 1045  CHOLHDL 3.4 10/02/2020 0415   VLDL 25 10/02/2020 0415   LDLCALC 94 10/02/2020 0415   LDLCALC 99 01/21/2019 1045   08/18/2018 Total cholesterol 193, triglycerides 187, HDL 52, LDL 103 Hemoglobin A1c 6.9% 11/25/2018  creatinine 2.13  ASSESSMENT:    1. Chronic diastolic heart failure ([place])   2. Coronary artery disease of native artery of native heart with stable angina pectoris ([place])   3. CHB (complete heart block) (HCC)   4. Status post biventricular pacemaker   5. Obesity hypoventilation syndrome ([place])   6. OSA (obstructive sleep apnea)   7. Severe obesity (BMI 35.0-39.9) with comorbidity ([place])   8. Mixed hypercholesterolemia and hypertriglyceridemia   9. Type 2 diabetes mellitus with stage 3a chronic kidney disease, without long-term current use of insulin (HCC)       PLAN:  In order of problems listed above:  CHF: She has persistent pedal and ankle edema, despite increasing the torsemide to 60 mg daily (she has not really noticed increased urine output with this change).  We will increase her torsemide to 80 mg daily,  taking advantage of the recent improvement in her renal function.  She has not had any recent problems with symptomatic hypotension.  In the past, heart failure exacerbation was often precipitated by loss of biventricular pacing. CAD s/p CABG: The pain in her shoulder blades sounds different than her usual angina pectoris.  When she had her last cardiac catheterization in January 2021, all major coronary territories have adequate coronary flow via bypasses or native vessels with stents.   On lifelong dual antiplatelet therapy.  Receiving statin.  Taking bisoprolol (I wonder if restarting the bisoprolol last November may have been the reason she had acute decompensation of heart failure). CHB: She is pacemaker dependent. CRT-P: Recent pacemaker generator change.  Has excellent atrial sensing, although the atrial pacing threshold is very high (thankfully she never requires atrial pacing).  The current right atrial lead and right ventricular pacing lead were placed in 2001. The current left ventricular lead was placed in 2008.   OSA/obesity-hypoventilation sd: Note that an ABG performed on 09/26/2020 showed a normal pH of 7.36 with a PCO2 of 62, consistent with severe chronic hypoventilation.  Intolerant of CPAP; pulmonary hypertension due to sleep apnea and obesity hypoventilation syndrome are likely contributing to a large degree to her shortness of breath.  She also has significant reactive airway disease and has periodically required steroid therapy. Morbid obesity: She has lost some real weight.  Now is severely obese range, but still with multiple comorbidities.  We will need to constantly reassess her "dry weight". HLP: Excellent dose rosuvastatin her most recent LDL cholesterol was 94, but she has lost substantial weight since then.  Recheck a lipid profile.  If LDL still greater than 70 we will add Zetia 10 mg once daily. DM: Well-controlled. CKD 3b: Improved creatinine.  She has a history of gout and  we should avoid excessive diuresis, especially with thiazide diuretics.  Etiology of her dyspnea is multifactorial, including morbid obesity and restrictive physiology, asthma, pulmonary hypertension due to obesity and obstructive sleep apnea as well as heart failure    Medication Adjustments/Labs and Tests Ordered: Current medicines are reviewed at length with the patient today.  Concerns regarding medicines are outlined above.  Medication changes, Labs and Tests ordered today are listed in the Patient Instructions below. Patient Instructions  Medication Instructions:  INCREASE the Torsemide to 80 mg twice a day. This will be two of the  40 mg tablets (2 tablets twice day) *If you need a refill on your cardiac medications before your next appointment, please call your pharmacy*   Lab Work: None ordered If you have labs (blood work) drawn today and your tests are completely normal, you will receive your results only by: [place] (if you have MyChart) OR A paper copy in the mail If you have any lab test that is abnormal or we need to change your treatment, we will call you to review the results.   Testing/Procedures: None ordered   Follow-Up: At [facility], you and your health needs are our priority.  As part of our continuing mission to provide you with exceptional heart care, we have created designated Provider Care Teams.  These Care Teams include your primary Cardiologist (physician) and Advanced Practice Providers (APPs -  Physician Assistants and Nurse Practitioners) who all work together to provide you with the care you need, when you need it.  We recommend signing up for the patient portal called "MyChart".  Sign up information is provided on this After Visit Summary.  MyChart is used to connect with patients for Virtual Visits (Telemedicine).  Patients are able to view lab/test results, encounter notes, upcoming appointments, etc.  Non-urgent messages can be sent to  your provider as well.   To learn more about what you can do with MyChart, go to [email or web address].    Your next appointment:   6 month(s)  The format for your next appointment:   In Person  Provider:   Sanda Klein, MD {      Signed, Alicia Klein, MD  04/02/2021 6:02 PM    [facility] [address], [place], [place]  [address] Phone: [phone]; Fax: [phone]

## 2021-04-02 ENCOUNTER — Ambulatory Visit (INDEPENDENT_AMBULATORY_CARE_PROVIDER_SITE_OTHER): Payer: Medicare Other

## 2021-04-02 DIAGNOSIS — N183 Chronic kidney disease, stage 3 unspecified: Secondary | ICD-10-CM | POA: Diagnosis not present

## 2021-04-02 DIAGNOSIS — I442 Atrioventricular block, complete: Secondary | ICD-10-CM

## 2021-04-02 DIAGNOSIS — M109 Gout, unspecified: Secondary | ICD-10-CM | POA: Diagnosis not present

## 2021-04-02 DIAGNOSIS — G4733 Obstructive sleep apnea (adult) (pediatric): Secondary | ICD-10-CM | POA: Diagnosis not present

## 2021-04-02 DIAGNOSIS — Z7689 Persons encountering health services in other specified circumstances: Secondary | ICD-10-CM | POA: Diagnosis not present

## 2021-04-02 DIAGNOSIS — Z7902 Long term (current) use of antithrombotics/antiplatelets: Secondary | ICD-10-CM | POA: Diagnosis not present

## 2021-04-02 DIAGNOSIS — I5031 Acute diastolic (congestive) heart failure: Secondary | ICD-10-CM | POA: Diagnosis not present

## 2021-04-02 DIAGNOSIS — E1122 Type 2 diabetes mellitus with diabetic chronic kidney disease: Secondary | ICD-10-CM | POA: Diagnosis not present

## 2021-04-02 DIAGNOSIS — D649 Anemia, unspecified: Secondary | ICD-10-CM | POA: Diagnosis not present

## 2021-04-02 DIAGNOSIS — I10 Essential (primary) hypertension: Secondary | ICD-10-CM | POA: Diagnosis not present

## 2021-04-02 DIAGNOSIS — E662 Morbid (severe) obesity with alveolar hypoventilation: Secondary | ICD-10-CM | POA: Insufficient documentation

## 2021-04-02 DIAGNOSIS — K5900 Constipation, unspecified: Secondary | ICD-10-CM | POA: Diagnosis not present

## 2021-04-02 DIAGNOSIS — I5032 Chronic diastolic (congestive) heart failure: Secondary | ICD-10-CM | POA: Diagnosis not present

## 2021-04-03 ENCOUNTER — Inpatient Hospital Stay (HOSPITAL_COMMUNITY): Payer: Medicare Other | Admitting: Anesthesiology

## 2021-04-03 ENCOUNTER — Other Ambulatory Visit: Payer: Self-pay

## 2021-04-03 ENCOUNTER — Emergency Department (HOSPITAL_COMMUNITY): Payer: Medicare Other

## 2021-04-03 ENCOUNTER — Inpatient Hospital Stay (HOSPITAL_COMMUNITY)
Admission: EM | Admit: 2021-04-03 | Discharge: 2021-04-28 | DRG: 956 | Disposition: A | Discharge status: Skilled Nursing Facility | Payer: Medicare Other | Attending: Internal Medicine | Admitting: Internal Medicine

## 2021-04-03 ENCOUNTER — Encounter (HOSPITAL_COMMUNITY): Payer: Self-pay | Admitting: Internal Medicine

## 2021-04-03 DIAGNOSIS — J810 Acute pulmonary edema: Secondary | ICD-10-CM | POA: Diagnosis not present

## 2021-04-03 DIAGNOSIS — S72142D Displaced intertrochanteric fracture of left femur, subsequent encounter for closed fracture with routine healing: Secondary | ICD-10-CM | POA: Diagnosis not present

## 2021-04-03 DIAGNOSIS — Z7984 Long term (current) use of oral hypoglycemic drugs: Secondary | ICD-10-CM | POA: Diagnosis not present

## 2021-04-03 DIAGNOSIS — Z833 Family history of diabetes mellitus: Secondary | ICD-10-CM

## 2021-04-03 DIAGNOSIS — S72002A Fracture of unspecified part of neck of left femur, initial encounter for closed fracture: Secondary | ICD-10-CM | POA: Diagnosis not present

## 2021-04-03 DIAGNOSIS — R079 Chest pain, unspecified: Secondary | ICD-10-CM | POA: Diagnosis not present

## 2021-04-03 DIAGNOSIS — M25562 Pain in left knee: Secondary | ICD-10-CM | POA: Diagnosis not present

## 2021-04-03 DIAGNOSIS — I517 Cardiomegaly: Secondary | ICD-10-CM | POA: Diagnosis not present

## 2021-04-03 DIAGNOSIS — D6489 Other specified anemias: Secondary | ICD-10-CM | POA: Diagnosis present

## 2021-04-03 DIAGNOSIS — M546 Pain in thoracic spine: Secondary | ICD-10-CM | POA: Diagnosis not present

## 2021-04-03 DIAGNOSIS — Z992 Dependence on renal dialysis: Secondary | ICD-10-CM

## 2021-04-03 DIAGNOSIS — J9690 Respiratory failure, unspecified, unspecified whether with hypoxia or hypercapnia: Secondary | ICD-10-CM

## 2021-04-03 DIAGNOSIS — E119 Type 2 diabetes mellitus without complications: Secondary | ICD-10-CM | POA: Diagnosis not present

## 2021-04-03 DIAGNOSIS — Z888 Allergy status to other drugs, medicaments and biological substances status: Secondary | ICD-10-CM

## 2021-04-03 DIAGNOSIS — I5033 Acute on chronic diastolic (congestive) heart failure: Secondary | ICD-10-CM | POA: Diagnosis not present

## 2021-04-03 DIAGNOSIS — S8992XA Unspecified injury of left lower leg, initial encounter: Secondary | ICD-10-CM | POA: Diagnosis not present

## 2021-04-03 DIAGNOSIS — D696 Thrombocytopenia, unspecified: Secondary | ICD-10-CM | POA: Diagnosis present

## 2021-04-03 DIAGNOSIS — J9610 Chronic respiratory failure, unspecified whether with hypoxia or hypercapnia: Secondary | ICD-10-CM

## 2021-04-03 DIAGNOSIS — D631 Anemia in chronic kidney disease: Secondary | ICD-10-CM | POA: Diagnosis not present

## 2021-04-03 DIAGNOSIS — I442 Atrioventricular block, complete: Secondary | ICD-10-CM | POA: Diagnosis not present

## 2021-04-03 DIAGNOSIS — N1832 Chronic kidney disease, stage 3b: Secondary | ICD-10-CM | POA: Diagnosis not present

## 2021-04-03 DIAGNOSIS — E7800 Pure hypercholesterolemia, unspecified: Secondary | ICD-10-CM | POA: Diagnosis present

## 2021-04-03 DIAGNOSIS — Z20822 Contact with and (suspected) exposure to covid-19: Secondary | ICD-10-CM | POA: Diagnosis not present

## 2021-04-03 DIAGNOSIS — M25552 Pain in left hip: Secondary | ICD-10-CM | POA: Diagnosis not present

## 2021-04-03 DIAGNOSIS — R1010 Upper abdominal pain, unspecified: Secondary | ICD-10-CM | POA: Diagnosis not present

## 2021-04-03 DIAGNOSIS — E781 Pure hyperglyceridemia: Secondary | ICD-10-CM | POA: Diagnosis present

## 2021-04-03 DIAGNOSIS — K5901 Slow transit constipation: Secondary | ICD-10-CM | POA: Diagnosis not present

## 2021-04-03 DIAGNOSIS — Z7689 Persons encountering health services in other specified circumstances: Secondary | ICD-10-CM | POA: Diagnosis not present

## 2021-04-03 DIAGNOSIS — I9581 Postprocedural hypotension: Secondary | ICD-10-CM | POA: Diagnosis not present

## 2021-04-03 DIAGNOSIS — D62 Acute posthemorrhagic anemia: Secondary | ICD-10-CM | POA: Diagnosis not present

## 2021-04-03 DIAGNOSIS — Z955 Presence of coronary angioplasty implant and graft: Secondary | ICD-10-CM

## 2021-04-03 DIAGNOSIS — N170 Acute kidney failure with tubular necrosis: Secondary | ICD-10-CM | POA: Diagnosis not present

## 2021-04-03 DIAGNOSIS — S72002D Fracture of unspecified part of neck of left femur, subsequent encounter for closed fracture with routine healing: Secondary | ICD-10-CM | POA: Diagnosis not present

## 2021-04-03 DIAGNOSIS — Z825 Family history of asthma and other chronic lower respiratory diseases: Secondary | ICD-10-CM

## 2021-04-03 DIAGNOSIS — K5909 Other constipation: Secondary | ICD-10-CM | POA: Diagnosis present

## 2021-04-03 DIAGNOSIS — J9612 Chronic respiratory failure with hypercapnia: Secondary | ICD-10-CM | POA: Diagnosis not present

## 2021-04-03 DIAGNOSIS — M7989 Other specified soft tissue disorders: Secondary | ICD-10-CM | POA: Diagnosis not present

## 2021-04-03 DIAGNOSIS — M549 Dorsalgia, unspecified: Secondary | ICD-10-CM | POA: Diagnosis present

## 2021-04-03 DIAGNOSIS — R6889 Other general symptoms and signs: Secondary | ICD-10-CM | POA: Diagnosis not present

## 2021-04-03 DIAGNOSIS — Z8673 Personal history of transient ischemic attack (TIA), and cerebral infarction without residual deficits: Secondary | ICD-10-CM

## 2021-04-03 DIAGNOSIS — I2510 Atherosclerotic heart disease of native coronary artery without angina pectoris: Secondary | ICD-10-CM | POA: Diagnosis present

## 2021-04-03 DIAGNOSIS — G8911 Acute pain due to trauma: Secondary | ICD-10-CM | POA: Diagnosis not present

## 2021-04-03 DIAGNOSIS — S32512A Fracture of superior rim of left pubis, initial encounter for closed fracture: Secondary | ICD-10-CM | POA: Diagnosis not present

## 2021-04-03 DIAGNOSIS — N186 End stage renal disease: Secondary | ICD-10-CM | POA: Diagnosis present

## 2021-04-03 DIAGNOSIS — W19XXXA Unspecified fall, initial encounter: Secondary | ICD-10-CM | POA: Diagnosis not present

## 2021-04-03 DIAGNOSIS — E782 Mixed hyperlipidemia: Secondary | ICD-10-CM | POA: Diagnosis present

## 2021-04-03 DIAGNOSIS — K589 Irritable bowel syndrome without diarrhea: Secondary | ICD-10-CM | POA: Diagnosis present

## 2021-04-03 DIAGNOSIS — E871 Hypo-osmolality and hyponatremia: Secondary | ICD-10-CM | POA: Diagnosis not present

## 2021-04-03 DIAGNOSIS — R109 Unspecified abdominal pain: Secondary | ICD-10-CM

## 2021-04-03 DIAGNOSIS — I5032 Chronic diastolic (congestive) heart failure: Secondary | ICD-10-CM | POA: Diagnosis present

## 2021-04-03 DIAGNOSIS — S7222XA Displaced subtrochanteric fracture of left femur, initial encounter for closed fracture: Secondary | ICD-10-CM | POA: Diagnosis not present

## 2021-04-03 DIAGNOSIS — Z79899 Other long term (current) drug therapy: Secondary | ICD-10-CM

## 2021-04-03 DIAGNOSIS — Z882 Allergy status to sulfonamides status: Secondary | ICD-10-CM

## 2021-04-03 DIAGNOSIS — I10 Essential (primary) hypertension: Secondary | ICD-10-CM | POA: Diagnosis not present

## 2021-04-03 DIAGNOSIS — R52 Pain, unspecified: Secondary | ICD-10-CM

## 2021-04-03 DIAGNOSIS — M79605 Pain in left leg: Secondary | ICD-10-CM | POA: Diagnosis not present

## 2021-04-03 DIAGNOSIS — I700 Atherosclerosis of aorta: Secondary | ICD-10-CM | POA: Diagnosis not present

## 2021-04-03 DIAGNOSIS — Z9889 Other specified postprocedural states: Secondary | ICD-10-CM | POA: Diagnosis not present

## 2021-04-03 DIAGNOSIS — E2740 Unspecified adrenocortical insufficiency: Secondary | ICD-10-CM | POA: Diagnosis present

## 2021-04-03 DIAGNOSIS — E876 Hypokalemia: Secondary | ICD-10-CM | POA: Diagnosis present

## 2021-04-03 DIAGNOSIS — R34 Anuria and oliguria: Secondary | ICD-10-CM | POA: Diagnosis not present

## 2021-04-03 DIAGNOSIS — N281 Cyst of kidney, acquired: Secondary | ICD-10-CM | POA: Diagnosis not present

## 2021-04-03 DIAGNOSIS — N19 Unspecified kidney failure: Secondary | ICD-10-CM | POA: Diagnosis not present

## 2021-04-03 DIAGNOSIS — N760 Acute vaginitis: Secondary | ICD-10-CM | POA: Diagnosis not present

## 2021-04-03 DIAGNOSIS — I132 Hypertensive heart and chronic kidney disease with heart failure and with stage 5 chronic kidney disease, or end stage renal disease: Secondary | ICD-10-CM | POA: Diagnosis present

## 2021-04-03 DIAGNOSIS — Z4901 Encounter for fitting and adjustment of extracorporeal dialysis catheter: Secondary | ICD-10-CM | POA: Diagnosis not present

## 2021-04-03 DIAGNOSIS — Y92009 Unspecified place in unspecified non-institutional (private) residence as the place of occurrence of the external cause: Secondary | ICD-10-CM

## 2021-04-03 DIAGNOSIS — K219 Gastro-esophageal reflux disease without esophagitis: Secondary | ICD-10-CM | POA: Diagnosis not present

## 2021-04-03 DIAGNOSIS — Z981 Arthrodesis status: Secondary | ICD-10-CM

## 2021-04-03 DIAGNOSIS — Z7902 Long term (current) use of antithrombotics/antiplatelets: Secondary | ICD-10-CM

## 2021-04-03 DIAGNOSIS — Z743 Need for continuous supervision: Secondary | ICD-10-CM | POA: Diagnosis not present

## 2021-04-03 DIAGNOSIS — E1121 Type 2 diabetes mellitus with diabetic nephropathy: Secondary | ICD-10-CM | POA: Diagnosis not present

## 2021-04-03 DIAGNOSIS — J9622 Acute and chronic respiratory failure with hypercapnia: Secondary | ICD-10-CM | POA: Diagnosis not present

## 2021-04-03 DIAGNOSIS — R32 Unspecified urinary incontinence: Secondary | ICD-10-CM | POA: Diagnosis present

## 2021-04-03 DIAGNOSIS — R0602 Shortness of breath: Secondary | ICD-10-CM | POA: Diagnosis not present

## 2021-04-03 DIAGNOSIS — J9621 Acute and chronic respiratory failure with hypoxia: Secondary | ICD-10-CM | POA: Diagnosis not present

## 2021-04-03 DIAGNOSIS — J811 Chronic pulmonary edema: Secondary | ICD-10-CM | POA: Diagnosis not present

## 2021-04-03 DIAGNOSIS — L8990 Pressure ulcer of unspecified site, unspecified stage: Secondary | ICD-10-CM | POA: Insufficient documentation

## 2021-04-03 DIAGNOSIS — E1165 Type 2 diabetes mellitus with hyperglycemia: Secondary | ICD-10-CM | POA: Diagnosis not present

## 2021-04-03 DIAGNOSIS — Z419 Encounter for procedure for purposes other than remedying health state, unspecified: Secondary | ICD-10-CM

## 2021-04-03 DIAGNOSIS — W010XXA Fall on same level from slipping, tripping and stumbling without subsequent striking against object, initial encounter: Secondary | ICD-10-CM | POA: Diagnosis present

## 2021-04-03 DIAGNOSIS — R262 Difficulty in walking, not elsewhere classified: Secondary | ICD-10-CM | POA: Diagnosis not present

## 2021-04-03 DIAGNOSIS — R300 Dysuria: Secondary | ICD-10-CM | POA: Diagnosis not present

## 2021-04-03 DIAGNOSIS — E870 Hyperosmolality and hypernatremia: Secondary | ICD-10-CM | POA: Diagnosis present

## 2021-04-03 DIAGNOSIS — D649 Anemia, unspecified: Secondary | ICD-10-CM | POA: Diagnosis not present

## 2021-04-03 DIAGNOSIS — I9589 Other hypotension: Secondary | ICD-10-CM | POA: Diagnosis not present

## 2021-04-03 DIAGNOSIS — J9601 Acute respiratory failure with hypoxia: Secondary | ICD-10-CM | POA: Diagnosis not present

## 2021-04-03 DIAGNOSIS — Z7982 Long term (current) use of aspirin: Secondary | ICD-10-CM

## 2021-04-03 DIAGNOSIS — G9341 Metabolic encephalopathy: Secondary | ICD-10-CM | POA: Diagnosis not present

## 2021-04-03 DIAGNOSIS — J4520 Mild intermittent asthma, uncomplicated: Secondary | ICD-10-CM | POA: Diagnosis not present

## 2021-04-03 DIAGNOSIS — R579 Shock, unspecified: Secondary | ICD-10-CM | POA: Diagnosis not present

## 2021-04-03 DIAGNOSIS — Z881 Allergy status to other antibiotic agents status: Secondary | ICD-10-CM

## 2021-04-03 DIAGNOSIS — S72142A Displaced intertrochanteric fracture of left femur, initial encounter for closed fracture: Principal | ICD-10-CM | POA: Diagnosis present

## 2021-04-03 DIAGNOSIS — E1129 Type 2 diabetes mellitus with other diabetic kidney complication: Secondary | ICD-10-CM | POA: Diagnosis not present

## 2021-04-03 DIAGNOSIS — E662 Morbid (severe) obesity with alveolar hypoventilation: Secondary | ICD-10-CM | POA: Diagnosis present

## 2021-04-03 DIAGNOSIS — Z8249 Family history of ischemic heart disease and other diseases of the circulatory system: Secondary | ICD-10-CM

## 2021-04-03 DIAGNOSIS — Z6841 Body Mass Index (BMI) 40.0 and over, adult: Secondary | ICD-10-CM

## 2021-04-03 DIAGNOSIS — J449 Chronic obstructive pulmonary disease, unspecified: Secondary | ICD-10-CM | POA: Diagnosis present

## 2021-04-03 DIAGNOSIS — J9811 Atelectasis: Secondary | ICD-10-CM | POA: Diagnosis not present

## 2021-04-03 DIAGNOSIS — I110 Hypertensive heart disease with heart failure: Secondary | ICD-10-CM | POA: Diagnosis not present

## 2021-04-03 DIAGNOSIS — Z91041 Radiographic dye allergy status: Secondary | ICD-10-CM

## 2021-04-03 DIAGNOSIS — H04123 Dry eye syndrome of bilateral lacrimal glands: Secondary | ICD-10-CM | POA: Diagnosis not present

## 2021-04-03 DIAGNOSIS — R0689 Other abnormalities of breathing: Secondary | ICD-10-CM | POA: Diagnosis not present

## 2021-04-03 DIAGNOSIS — J42 Unspecified chronic bronchitis: Secondary | ICD-10-CM | POA: Diagnosis not present

## 2021-04-03 DIAGNOSIS — N179 Acute kidney failure, unspecified: Secondary | ICD-10-CM

## 2021-04-03 DIAGNOSIS — M8580 Other specified disorders of bone density and structure, unspecified site: Secondary | ICD-10-CM | POA: Diagnosis present

## 2021-04-03 DIAGNOSIS — Z1159 Encounter for screening for other viral diseases: Secondary | ICD-10-CM | POA: Diagnosis not present

## 2021-04-03 DIAGNOSIS — K581 Irritable bowel syndrome with constipation: Secondary | ICD-10-CM

## 2021-04-03 DIAGNOSIS — Z7401 Bed confinement status: Secondary | ICD-10-CM | POA: Diagnosis not present

## 2021-04-03 DIAGNOSIS — N183 Chronic kidney disease, stage 3 unspecified: Secondary | ICD-10-CM

## 2021-04-03 DIAGNOSIS — J9611 Chronic respiratory failure with hypoxia: Secondary | ICD-10-CM | POA: Diagnosis not present

## 2021-04-03 DIAGNOSIS — I509 Heart failure, unspecified: Secondary | ICD-10-CM | POA: Diagnosis not present

## 2021-04-03 DIAGNOSIS — E1122 Type 2 diabetes mellitus with diabetic chronic kidney disease: Secondary | ICD-10-CM | POA: Diagnosis present

## 2021-04-03 DIAGNOSIS — I25119 Atherosclerotic heart disease of native coronary artery with unspecified angina pectoris: Secondary | ICD-10-CM | POA: Diagnosis not present

## 2021-04-03 DIAGNOSIS — Z951 Presence of aortocoronary bypass graft: Secondary | ICD-10-CM | POA: Diagnosis not present

## 2021-04-03 DIAGNOSIS — M454 Ankylosing spondylitis of thoracic region: Secondary | ICD-10-CM | POA: Diagnosis not present

## 2021-04-03 DIAGNOSIS — M6281 Muscle weakness (generalized): Secondary | ICD-10-CM | POA: Diagnosis not present

## 2021-04-03 DIAGNOSIS — Z91013 Allergy to seafood: Secondary | ICD-10-CM

## 2021-04-03 DIAGNOSIS — Z87891 Personal history of nicotine dependence: Secondary | ICD-10-CM

## 2021-04-03 DIAGNOSIS — Z95810 Presence of automatic (implantable) cardiac defibrillator: Secondary | ICD-10-CM

## 2021-04-03 DIAGNOSIS — S32512D Fracture of superior rim of left pubis, subsequent encounter for fracture with routine healing: Secondary | ICD-10-CM | POA: Diagnosis not present

## 2021-04-03 DIAGNOSIS — M109 Gout, unspecified: Secondary | ICD-10-CM | POA: Diagnosis present

## 2021-04-03 DIAGNOSIS — I959 Hypotension, unspecified: Secondary | ICD-10-CM | POA: Diagnosis not present

## 2021-04-03 DIAGNOSIS — Z803 Family history of malignant neoplasm of breast: Secondary | ICD-10-CM

## 2021-04-03 DIAGNOSIS — I5022 Chronic systolic (congestive) heart failure: Secondary | ICD-10-CM | POA: Diagnosis not present

## 2021-04-03 DIAGNOSIS — Z9981 Dependence on supplemental oxygen: Secondary | ICD-10-CM

## 2021-04-03 DIAGNOSIS — Z7951 Long term (current) use of inhaled steroids: Secondary | ICD-10-CM

## 2021-04-03 DIAGNOSIS — E875 Hyperkalemia: Secondary | ICD-10-CM | POA: Diagnosis not present

## 2021-04-03 LAB — RESP PANEL BY RT-PCR (FLU A&B, COVID) ARPGX2
Influenza A by PCR: NEGATIVE
Influenza B by PCR: NEGATIVE
SARS Coronavirus 2 by RT PCR: NEGATIVE

## 2021-04-03 LAB — COMPREHENSIVE METABOLIC PANEL
ALT: 11 U/L (ref 0–44)
AST: 18 U/L (ref 15–41)
Albumin: 2.9 g/dL — ABNORMAL LOW (ref 3.5–5.0)
Alkaline Phosphatase: 79 U/L (ref 38–126)
Anion gap: 7 (ref 5–15)
BUN: 18 mg/dL (ref 8–23)
CO2: 33 mmol/L — ABNORMAL HIGH (ref 22–32)
Calcium: 8.7 mg/dL — ABNORMAL LOW (ref 8.9–10.3)
Chloride: 97 mmol/L — ABNORMAL LOW (ref 98–111)
Creatinine, Ser: 1.69 mg/dL — ABNORMAL HIGH (ref 0.44–1.00)
GFR, Estimated: 31 mL/min — ABNORMAL LOW (ref >60)
Glucose, Bld: 150 mg/dL — ABNORMAL HIGH (ref 70–99)
Potassium: 3.4 mmol/L — ABNORMAL LOW (ref 3.5–5.1)
Sodium: 137 mmol/L (ref 135–145)
Total Bilirubin: 0.5 mg/dL (ref 0.3–1.2)
Total Protein: 5.8 g/dL — ABNORMAL LOW (ref 6.5–8.1)

## 2021-04-03 LAB — CUP PACEART REMOTE DEVICE CHECK
Battery Remaining Longevity: 114 mo
Battery Remaining Percentage: 100 %
Brady Statistic RA Percent Paced: 0 %
Brady Statistic RV Percent Paced: 95 %
Date Time Interrogation Session: 20230221055800
Implantable Lead Implant Date: 20080923
Implantable Lead Implant Date: 20080923
Implantable Lead Implant Date: 20080923
Implantable Lead Location: 753858
Implantable Lead Location: 753859
Implantable Lead Location: 753860
Implantable Lead Model: 4285
Implantable Lead Model: 4555
Implantable Lead Model: 7120
Implantable Lead Serial Number: 170220
Implantable Lead Serial Number: 486468
Implantable Pulse Generator Implant Date: 20221121
Lead Channel Impedance Value: 1032 Ohm
Lead Channel Impedance Value: 677 Ohm
Lead Channel Impedance Value: 795 Ohm
Lead Channel Pacing Threshold Amplitude: 0.7 V
Lead Channel Pacing Threshold Amplitude: 0.9 V
Lead Channel Pacing Threshold Pulse Width: 0.4 ms
Lead Channel Pacing Threshold Pulse Width: 1 ms
Lead Channel Setting Pacing Amplitude: 2 V
Lead Channel Setting Pacing Amplitude: 3.5 V
Lead Channel Setting Pacing Pulse Width: 0.4 ms
Lead Channel Setting Pacing Pulse Width: 1 ms
Lead Channel Setting Sensing Sensitivity: 3 mV
Lead Channel Setting Sensing Sensitivity: 3 mV
Pulse Gen Serial Number: 720887

## 2021-04-03 LAB — CBC WITH DIFFERENTIAL/PLATELET
Abs Immature Granulocytes: 0.03 10*3/uL (ref 0.00–0.07)
Basophils Absolute: 0 10*3/uL (ref 0.0–0.1)
Basophils Relative: 1 %
Eosinophils Absolute: 0.2 10*3/uL (ref 0.0–0.5)
Eosinophils Relative: 2 %
HCT: 37.2 % (ref 36.0–46.0)
Hemoglobin: 10.8 g/dL — ABNORMAL LOW (ref 12.0–15.0)
Immature Granulocytes: 1 %
Lymphocytes Relative: 12 %
Lymphs Abs: 0.8 10*3/uL (ref 0.7–4.0)
MCH: 28.8 pg (ref 26.0–34.0)
MCHC: 29 g/dL — ABNORMAL LOW (ref 30.0–36.0)
MCV: 99.2 fL (ref 80.0–100.0)
Monocytes Absolute: 0.4 10*3/uL (ref 0.1–1.0)
Monocytes Relative: 7 %
Neutro Abs: 4.8 10*3/uL (ref 1.7–7.7)
Neutrophils Relative %: 77 %
Platelets: 139 10*3/uL — ABNORMAL LOW (ref 150–400)
RBC: 3.75 MIL/uL — ABNORMAL LOW (ref 3.87–5.11)
RDW: 14.4 % (ref 11.5–15.5)
WBC: 6.2 10*3/uL (ref 4.0–10.5)
nRBC: 0 % (ref 0.0–0.2)

## 2021-04-03 LAB — BRAIN NATRIURETIC PEPTIDE: B Natriuretic Peptide: 333.8 pg/mL — ABNORMAL HIGH (ref 0.0–100.0)

## 2021-04-03 MED ORDER — MIDAZOLAM HCL 2 MG/2ML IJ SOLN
INTRAMUSCULAR | Status: AC
  Filled 2021-04-03: qty 2

## 2021-04-03 MED ORDER — ACETAMINOPHEN 325 MG PO TABS
650.0000 mg | ORAL_TABLET | Freq: Four times a day (QID) | ORAL | Status: DC | PRN
  Administered 2021-04-06 – 2021-04-27 (×11): 650 mg via ORAL
  Filled 2021-04-03 (×12): qty 2

## 2021-04-03 MED ORDER — CHLORHEXIDINE GLUCONATE 4 % EX LIQD
60.0000 mL | Freq: Once | CUTANEOUS | Status: AC
  Administered 2021-04-04: 4 via TOPICAL
  Filled 2021-04-03: qty 60

## 2021-04-03 MED ORDER — ROSUVASTATIN CALCIUM 20 MG PO TABS
40.0000 mg | ORAL_TABLET | Freq: Every evening | ORAL | Status: DC
  Administered 2021-04-03 – 2021-04-06 (×4): 40 mg via ORAL
  Filled 2021-04-03 (×4): qty 2

## 2021-04-03 MED ORDER — ASPIRIN EC 81 MG PO TBEC
81.0000 mg | DELAYED_RELEASE_TABLET | Freq: Every day | ORAL | Status: DC
  Administered 2021-04-04 – 2021-04-16 (×11): 81 mg via ORAL
  Filled 2021-04-03 (×11): qty 1

## 2021-04-03 MED ORDER — CLOPIDOGREL BISULFATE 75 MG PO TABS
75.0000 mg | ORAL_TABLET | Freq: Every day | ORAL | Status: DC
  Administered 2021-04-04 – 2021-04-28 (×23): 75 mg via ORAL
  Filled 2021-04-03 (×23): qty 1

## 2021-04-03 MED ORDER — POLYETHYLENE GLYCOL 3350 17 G PO PACK
17.0000 g | PACK | Freq: Every day | ORAL | Status: DC
  Administered 2021-04-04 – 2021-04-21 (×9): 17 g via ORAL
  Filled 2021-04-03 (×11): qty 1

## 2021-04-03 MED ORDER — INSULIN ASPART 100 UNIT/ML IJ SOLN
0.0000 [IU] | Freq: Three times a day (TID) | INTRAMUSCULAR | Status: DC
  Administered 2021-04-05 – 2021-04-06 (×3): 2 [IU] via SUBCUTANEOUS
  Administered 2021-04-06 – 2021-04-08 (×6): 1 [IU] via SUBCUTANEOUS

## 2021-04-03 MED ORDER — VANCOMYCIN HCL 1500 MG/300ML IV SOLN
1500.0000 mg | INTRAVENOUS | Status: AC
  Administered 2021-04-04: 1500 mg via INTRAVENOUS
  Filled 2021-04-03: qty 300

## 2021-04-03 MED ORDER — BISOPROLOL FUMARATE 5 MG PO TABS
7.5000 mg | ORAL_TABLET | Freq: Every day | ORAL | Status: DC
Start: 2021-04-03 — End: 2021-04-04
  Administered 2021-04-03 – 2021-04-04 (×2): 7.5 mg via ORAL
  Filled 2021-04-03 (×2): qty 1.5

## 2021-04-03 MED ORDER — POLYVINYL ALCOHOL 1.4 % OP SOLN
1.0000 [drp] | Freq: Two times a day (BID) | OPHTHALMIC | Status: DC
  Administered 2021-04-03 – 2021-04-28 (×47): 1 [drp] via OPHTHALMIC
  Filled 2021-04-03 (×3): qty 15

## 2021-04-03 MED ORDER — MORPHINE SULFATE (PF) 2 MG/ML IV SOLN
2.0000 mg | Freq: Once | INTRAVENOUS | Status: AC
  Administered 2021-04-03: 2 mg via INTRAVENOUS
  Filled 2021-04-03: qty 1

## 2021-04-03 MED ORDER — CHLORHEXIDINE GLUCONATE 4 % EX LIQD
60.0000 mL | Freq: Once | CUTANEOUS | Status: DC
  Filled 2021-04-03: qty 60

## 2021-04-03 MED ORDER — CYCLOSPORINE 0.05 % OP EMUL
1.0000 [drp] | Freq: Two times a day (BID) | OPHTHALMIC | Status: DC
  Administered 2021-04-03 – 2021-04-28 (×49): 1 [drp] via OPHTHALMIC
  Filled 2021-04-03 (×53): qty 30

## 2021-04-03 MED ORDER — MORPHINE SULFATE (PF) 2 MG/ML IV SOLN
0.5000 mg | INTRAVENOUS | Status: DC | PRN
  Administered 2021-04-03 – 2021-04-05 (×6): 0.5 mg via INTRAVENOUS
  Filled 2021-04-03 (×6): qty 1

## 2021-04-03 MED ORDER — LORATADINE 10 MG PO TABS
10.0000 mg | ORAL_TABLET | Freq: Every day | ORAL | Status: DC
  Administered 2021-04-03 – 2021-04-15 (×10): 10 mg via ORAL
  Filled 2021-04-03 (×10): qty 1

## 2021-04-03 MED ORDER — MOMETASONE FURO-FORMOTEROL FUM 200-5 MCG/ACT IN AERO
2.0000 | INHALATION_SPRAY | Freq: Two times a day (BID) | RESPIRATORY_TRACT | Status: DC
Start: 2021-04-03 — End: 2021-04-28
  Administered 2021-04-04 – 2021-04-28 (×41): 2 via RESPIRATORY_TRACT
  Filled 2021-04-03 (×2): qty 8.8

## 2021-04-03 MED ORDER — ISOSORBIDE MONONITRATE ER 60 MG PO TB24
90.0000 mg | ORAL_TABLET | Freq: Every day | ORAL | Status: DC
  Administered 2021-04-03: 90 mg via ORAL
  Filled 2021-04-03: qty 1

## 2021-04-03 MED ORDER — BUPIVACAINE HCL (PF) 0.5 % IJ SOLN
INTRAMUSCULAR | Status: DC | PRN
  Administered 2021-04-03: 30 mL via PERINEURAL

## 2021-04-03 MED ORDER — GUAIFENESIN ER 600 MG PO TB12
600.0000 mg | ORAL_TABLET | Freq: Two times a day (BID) | ORAL | Status: DC | PRN
  Administered 2021-04-04 – 2021-04-08 (×4): 600 mg via ORAL
  Filled 2021-04-03 (×5): qty 1

## 2021-04-03 MED ORDER — FENTANYL CITRATE (PF) 100 MCG/2ML IJ SOLN: 100.0000 ug | Freq: Once | INTRAMUSCULAR | Status: AC

## 2021-04-03 MED ORDER — FENTANYL CITRATE (PF) 100 MCG/2ML IJ SOLN
INTRAMUSCULAR | Status: AC
  Administered 2021-04-03: 100 ug via INTRAVENOUS
  Filled 2021-04-03: qty 2

## 2021-04-03 MED ORDER — ONDANSETRON HCL 4 MG/2ML IJ SOLN
4.0000 mg | Freq: Once | INTRAMUSCULAR | Status: AC
  Administered 2021-04-03: 4 mg via INTRAVENOUS
  Filled 2021-04-03: qty 2

## 2021-04-03 MED ORDER — HYDROCODONE-ACETAMINOPHEN 5-325 MG PO TABS
1.0000 | ORAL_TABLET | Freq: Four times a day (QID) | ORAL | Status: DC | PRN
  Administered 2021-04-04 (×2): 2 via ORAL
  Administered 2021-04-05 – 2021-04-07 (×4): 1 via ORAL
  Administered 2021-04-08: 2 via ORAL
  Administered 2021-04-08: 1 via ORAL
  Administered 2021-04-08: 2 via ORAL
  Administered 2021-04-09: 1 via ORAL
  Administered 2021-04-09: 2 via ORAL
  Administered 2021-04-10: 1 via ORAL
  Administered 2021-04-10 – 2021-04-15 (×3): 2 via ORAL
  Administered 2021-04-16: 1 via ORAL
  Administered 2021-04-17: 2 via ORAL
  Administered 2021-04-18: 09:00:00 1 via ORAL
  Administered 2021-04-19 – 2021-04-20 (×4): 2 via ORAL
  Administered 2021-04-20 – 2021-04-21 (×3): 1 via ORAL
  Administered 2021-04-21 – 2021-04-22 (×4): 2 via ORAL
  Administered 2021-04-23: 1 via ORAL
  Administered 2021-04-23: 2 via ORAL
  Administered 2021-04-23 – 2021-04-24 (×3): 1 via ORAL
  Administered 2021-04-24: 2 via ORAL
  Administered 2021-04-25: 1 via ORAL
  Administered 2021-04-25 – 2021-04-28 (×9): 2 via ORAL
  Filled 2021-04-03 (×2): qty 1
  Filled 2021-04-03 (×2): qty 2
  Filled 2021-04-03: qty 1
  Filled 2021-04-03 (×2): qty 2
  Filled 2021-04-03: qty 1
  Filled 2021-04-03: qty 2
  Filled 2021-04-03: qty 1
  Filled 2021-04-03 (×3): qty 2
  Filled 2021-04-03: qty 1
  Filled 2021-04-03: qty 2
  Filled 2021-04-03: qty 1
  Filled 2021-04-03 (×2): qty 2
  Filled 2021-04-03 (×4): qty 1
  Filled 2021-04-03 (×2): qty 2
  Filled 2021-04-03: qty 1
  Filled 2021-04-03: qty 2
  Filled 2021-04-03: qty 1
  Filled 2021-04-03 (×3): qty 2
  Filled 2021-04-03: qty 1
  Filled 2021-04-03: qty 2
  Filled 2021-04-03: qty 1
  Filled 2021-04-03 (×2): qty 2
  Filled 2021-04-03: qty 1
  Filled 2021-04-03: qty 2
  Filled 2021-04-03 (×2): qty 1
  Filled 2021-04-03 (×6): qty 2

## 2021-04-03 MED ORDER — PANTOPRAZOLE SODIUM 40 MG PO TBEC
40.0000 mg | DELAYED_RELEASE_TABLET | Freq: Two times a day (BID) | ORAL | Status: DC
  Administered 2021-04-03 – 2021-04-16 (×20): 40 mg via ORAL
  Filled 2021-04-03 (×22): qty 1

## 2021-04-03 MED ORDER — POTASSIUM CHLORIDE CRYS ER 20 MEQ PO TBCR
20.0000 meq | EXTENDED_RELEASE_TABLET | Freq: Every morning | ORAL | Status: DC
  Administered 2021-04-04: 20 meq via ORAL
  Filled 2021-04-03: qty 1

## 2021-04-03 MED ORDER — SODIUM CHLORIDE 0.9 % IV BOLUS
500.0000 mL | Freq: Once | INTRAVENOUS | Status: AC
  Administered 2021-04-03: 500 mL via INTRAVENOUS

## 2021-04-03 MED ORDER — SENNA 8.6 MG PO TABS
1.0000 | ORAL_TABLET | Freq: Two times a day (BID) | ORAL | Status: DC
  Administered 2021-04-03 – 2021-04-21 (×23): 8.6 mg via ORAL
  Filled 2021-04-03 (×27): qty 1

## 2021-04-03 MED ORDER — CLONIDINE HCL (ANALGESIA) 100 MCG/ML EP SOLN
EPIDURAL | Status: DC | PRN
  Administered 2021-04-03: 100 ug

## 2021-04-03 MED ORDER — POTASSIUM CHLORIDE CRYS ER 20 MEQ PO TBCR
40.0000 meq | EXTENDED_RELEASE_TABLET | ORAL | Status: AC
  Administered 2021-04-03: 40 meq via ORAL
  Filled 2021-04-03: qty 2

## 2021-04-03 MED ORDER — ALLOPURINOL 100 MG PO TABS
100.0000 mg | ORAL_TABLET | Freq: Every day | ORAL | Status: DC
  Administered 2021-04-03 – 2021-04-28 (×24): 100 mg via ORAL
  Filled 2021-04-03 (×24): qty 1

## 2021-04-03 MED ORDER — TORSEMIDE 20 MG PO TABS
80.0000 mg | ORAL_TABLET | Freq: Every day | ORAL | Status: DC
  Administered 2021-04-03: 80 mg via ORAL
  Filled 2021-04-03: qty 4

## 2021-04-03 MED ORDER — HEPARIN SODIUM (PORCINE) 5000 UNIT/ML IJ SOLN
5000.0000 [IU] | Freq: Three times a day (TID) | INTRAMUSCULAR | Status: DC
  Administered 2021-04-03 (×2): 5000 [IU] via SUBCUTANEOUS
  Filled 2021-04-03 (×2): qty 1

## 2021-04-03 NOTE — H&P (Addendum)
History and Physical    Patient: [name] IRW:[id number] DOB: 1945/09/16 DOA: 04/03/2021 DOS: the patient was seen and examined on 04/03/2021 PCP: Lujean Amel, MD  Patient coming from: Home  Chief Complaint:  Chief Complaint  Patient presents with   Fall    HPI: [name] is a 76 y.o. female with medical history significant of  CAD s/p CABG '16 (LIMA to LAD, SVG to D1, SVG to OM1 and OM2, SVG to PDA), HFpEF, obesity, adrenal insufficiency, diabetes, hypertension, CKD stage III, OSA/obesity-hypoventilation syndrome on 3 L O2 chronically, complete heart block s/p PPM/ICD presents after having a fall at home.  At baseline patient ambulates with the use of a walker and notes that her son lives with her.  This morning at around 7 AM she was up and was not using her walker when her left leg gave out and she landed on her left side.  Denies any loss of consciousness or trauma to her head.  She felt immediate pain in her left hip and leg.  She was unable to move the leg or bear weight due to it worsening her pain.  She reports that she was lucky that her son was off from work and was able to call 911.  Her weight is down a couple pounds since she had last seen Dr. [name] 2 days ago where he had recommended her to be on torsemide 60 mg daily.  Upon admission into the emergency department patient was seen to be mildly tachypneic with all other vital signs maintained.  Labs noted hemoglobin 10.8, platelets 139 potassium 3.4, CO2 33, BUN 18, creatinine 1.69, and BNP 333.8.  Chest x-ray noted cardiomegaly with mild pulmonary vascular congestion.  X-rays noted a displaced intertrochanteric left hip fracture with left superior pubic ramus fracture.  Orthopedics have been formally consulted.  Review of Systems: As mentioned in the history of present illness. All other systems reviewed and are negative. Past Medical History:  Diagnosis Date   ABDOMINAL PAIN -GENERALIZED 02/21/2010    Qualifier: Diagnosis of  By: Chester Holstein NP, Nevin Bloodgood     AICD (automatic cardioverter/defibrillator) present    downgraded to CRT-P in 2014   Anemia    Arthritis    Asthma    Atrial fibrillation ([place]) 10/24/2015   Questionable history of A Fib/A Flutter. On device interrogation on 9/7, showed 0.66min of A Fib/A Flutter with 1% burden.  Device check in Jan 2018 showed no sustained AF (runs of less than 30 seconds). No anticoagulation indicated.   CAD (coronary artery disease)    a. 10/09/16 LHC: SVG->LAD patent, SVG->Diag patent, SVG->RCA patent, SVG->LCx occluded. EF 60%, b. 10/31/16 LHC DES to AV groove Circ, DES to intermed branch   Cellulitis and abscess of foot 12/2014   RT FOOT   CHF (congestive heart failure) (HCC)    Chronic bronchitis (HCC)    Chronic lower back pain    Complete heart block ([place]) 06/22/2015   Diverticulosis    Facial numbness 02/2016   Fatty liver disease, nonalcoholic    Gastritis    GERD (gastroesophageal reflux disease)    Gout    H/O hiatal hernia    HEMORRHOIDS-EXTERNAL 02/21/2010   Qualifier: Diagnosis of  By: Chester Holstein NP, Paula     High cholesterol    Hyperplastic colon polyp    Hypertension    IBS (irritable bowel syndrome)    Internal hemorrhoids    INTERNAL HEMORRHOIDS WITHOUT MENTION COMP 04/12/2007   Qualifier: Diagnosis of  By: Olevia Perches MD, Dora M    Nonischemic cardiomyopathy ([place]) 11/22/2011   Pt responded to BiV ICD- last EF 55-60% Sept 2017   Obesity    On home oxygen therapy    "2L at night" (10/31/2016)   OSA (obstructive sleep apnea)    "can't tolerate a mask" (10/30/2016)   PERSONAL HX COLONIC POLYPS 02/21/2010   Qualifier: Diagnosis of  By: Chester Holstein NP, Paula     Pneumonia    "couple times" (10/31/2016)   Presence of permanent cardiac pacemaker    11/02/12 [employer] V273 INTUA PPM   Right facial numbness 03/05/2016   Shortness of breath    Stroke ([place]) 09/2020   TIA (transient ischemic attack)    "recently" (10/31/2016)   Type  II diabetes mellitus ([place])    Past Surgical History:  Procedure Laterality Date   ABDOMINAL ULTRASOUND  12/01/2011   Peripelvic cysts- #1- 2.4x1.9x2.3cm, #2-1.2x0.9x1.2cm   ANKLE FRACTURE SURGERY Right    "had rod put in"   ANTERIOR CERVICAL DECOMP/DISCECTOMY FUSION     APPENDECTOMY     BACK SURGERY     BIOPSY  12/29/2017   Procedure: BIOPSY;  Surgeon: [name], MD;  Location: [facility];  Service: Endoscopy;;   BIV ICD INSERTION CRT-D  2001?   BIV PACEMAKER GENERATOR CHANGE OUT N/A 11/02/2012   Procedure: BIV PACEMAKER GENERATOR CHANGE OUT;  Surgeon: Sanda Klein, MD;  Location: [facility];  Service: Cardiovascular;  Laterality: N/A;   CARDIAC CATHETERIZATION  05/17/1999   No significant coronary obstructive disease w/ mild 20% luminal irregularity of the first diag branch of the LAD   CARDIAC CATHETERIZATION  07/08/2002   No significant CAD, moderately depressed LV systolic function   CARDIAC CATHETERIZATION Bilateral 04/26/2007   Normal findings, recommend medical therapy   CARDIAC CATHETERIZATION  02/18/2008   Moderate CAD, would benefit from having a functional study, recommend continue medical therapy   CARDIAC CATHETERIZATION  07/23/2012   Medical therapy   CARDIAC CATHETERIZATION N/A 11/24/2014   Procedure: Left Heart Cath and Coronary Angiography;  Surgeon: Troy Sine, MD;  Location: [facility] CV LAB;  Service: Cardiovascular;  Laterality: N/A;   CARDIAC CATHETERIZATION  11/27/2014   Procedure: Intravascular Pressure Wire/FFR Study;  Surgeon: Peter M Martinique, MD;  Location: [place] CV LAB;  Service: Cardiovascular;;   CARDIAC CATHETERIZATION  10/09/2016   CHOLECYSTECTOMY N/A 04/09/2018   Procedure: LAPAROSCOPIC CHOLECYSTECTOMY;  Surgeon: Greer Pickerel, MD;  [facility];  Service: General;  Laterality: N/A;   COLONOSCOPY WITH PROPOFOL N/A 12/29/2017   Procedure: COLONOSCOPY WITH PROPOFOL;  Surgeon: [name], MD;  [facility];   Service: Endoscopy;  Laterality: N/A;   CORONARY ANGIOGRAM  2010   CORONARY ARTERY BYPASS GRAFT N/A 11/29/2014   Procedure: CORONARY ARTERY BYPASS GRAFTING x 5 (LIMA-LAD, SVG-D, SVG-OM1-OM2, SVG-PD);  Surgeon: Melrose Nakayama, MD;  Location: [facility];  Service: Open Heart Surgery;  Laterality: N/A;   CORONARY STENT INTERVENTION N/A 10/31/2016   Procedure: CORONARY STENT INTERVENTION;  Surgeon: Burnell Blanks, MD;  Location: [place] [facility];  Service: Cardiovascular;  Laterality: N/A;   ESOPHAGOGASTRODUODENOSCOPY (EGD) WITH PROPOFOL N/A 12/29/2017   Procedure: ESOPHAGOGASTRODUODENOSCOPY (EGD) WITH PROPOFOL;  Surgeon: [name], MD;  [facility];  Service: Endoscopy;  Laterality: N/A;   FRACTURE SURGERY     INSERT / REPLACE / REMOVE PACEMAKER  1999   KNEE ARTHROSCOPY Bilateral    LEFT HEART CATH AND CORS/GRAFTS ANGIOGRAPHY N/A 12/09/2017  Procedure: LEFT HEART CATH AND CORS/GRAFTS ANGIOGRAPHY;  Surgeon: Troy Sine, MD;  Location: [facility];  Service: Cardiovascular;  Laterality: N/A;   LEFT HEART CATHETERIZATION WITH CORONARY ANGIOGRAM N/A 07/23/2012   Procedure: LEFT HEART CATHETERIZATION WITH CORONARY ANGIOGRAM;  Surgeon: Leonie Man, MD;  Location: [facility] C[facility] LAB;  Service: Cardiovascular;  Laterality: N/A;   LEXISCAN MYOVIEW  11/14/2011   Mild-moderate defect seen in Mid Inferolateral and Mid Anterolateral regions-consistant w/ infarct/scar. No significant ischemia demonstrated.   POLYPECTOMY  12/29/2017   Procedure: POLYPECTOMY;  Surgeon: [name], MD;  Location: [facility];  Service: Endoscopy;;   PPM GENERATOR CHANGEOUT N/A 12/31/2020   Procedure: PPM GENERATOR CHANGEOUT;  Surgeon: Sanda Klein, MD;  Location: [place];  Service: Cardiovascular;  Laterality: N/A;   RIGHT/LEFT HEART CATH AND CORONARY ANGIOGRAPHY N/A 10/09/2016   Procedure: RIGHT/LEFT HEART CATH AND CORONARY ANGIOGRAPHY;  Surgeon: Jolaine Artist,  MD;  Location: [facility];  Service: Cardiovascular;  Laterality: N/A;   RIGHT/LEFT HEART CATH AND CORONARY/GRAFT ANGIOGRAPHY N/A 03/11/2019   Procedure: RIGHT/LEFT HEART CATH AND CORONARY/GRAFT ANGIOGRAPHY;  Surgeon: Jolaine Artist, MD;  Location: [facility];  Service: Cardiovascular;  Laterality: N/A;   TEE WITHOUT CARDIOVERSION N/A 11/29/2014   Procedure: TRANSESOPHAGEAL ECHOCARDIOGRAM (TEE);  Surgeon: Melrose Nakayama, MD;  Location: [facility];  Service: Open Heart Surgery;  Laterality: N/A;   TRANSCAROTID ARTERY REVASCULARIZATION  Right 10/01/2020   Procedure: RIGHT TRANSCAROTID ARTERY REVASCULARIZATION;  Surgeon: [name], MD;  Location: [facility];  Service: Vascular;  Laterality: Right;   TRANSTHORACIC ECHOCARDIOGRAM  07/23/2012   EF 55-60%, normal-mild   TUBAL LIGATION     ULTRASOUND GUIDANCE FOR VASCULAR ACCESS Left 10/01/2020   Procedure: ULTRASOUND GUIDANCE FOR VASCULAR ACCESS;  Surgeon: [name], MD;  Location: [facility];  Service: Vascular;  Laterality: Left;   Social History:  reports that she quit smoking about 53 years ago. Her smoking use included cigarettes. She has a 0.75 pack-year smoking history. She has never used smokeless tobacco. She reports that she does not drink alcohol and does not use drugs.  Allergies  Allergen Reactions   Ivp Dye [Iodinated Contrast Media] Shortness Of Breath    No reaction to PO contrast with non-ionic dye.06-25-2014/rsm   Shellfish Allergy Anaphylaxis   Sulfa Antibiotics Shortness Of Breath   Iodine Hives   Atorvastatin Other (See Comments)    Pt states "causes bilateral leg pain/cramps."   Benadryl [Diphenhydramine] Other (See Comments)    "THIS DRIVES ME CRAZY AND MAKES ME FEEL LIKE I AM DYING" No problem last time they took   Colchicine Nausea And Vomiting   Contrast Media [Iodinated Contrast Media] Hives   Doxycycline Other (See Comments)    Unknown reaction, per patient   Uloric [Febuxostat] Other  (See Comments)    Unknown reaction   Cephalexin Itching and Rash   Zithromax [Azithromycin] Rash    Family History  Problem Relation Age of Onset   Breast cancer Mother    Diabetes Mother    Heart disease Maternal Grandmother    Kidney disease Maternal Grandmother    Diabetes Maternal Grandmother    Glaucoma Maternal Aunt    Heart disease Maternal Aunt    Asthma Sister    Colon cancer Neg Hx    Stomach cancer Neg Hx    Pancreatic cancer Neg Hx     Prior to Admission medications   Medication Sig Start Date End  Date Taking? Authorizing Provider  ACCU-CHEK AVIVA PLUS test strip Check blood sugar twice daily 10/19/19   [provider]  Accu-Chek Softclix Lancets lancets Check blood sugar twice daily 08/10/19   [provider]  acetaminophen (TYLENOL) 500 MG tablet Take 1,000 mg by mouth 2 (two) times daily as needed (for pain or headaches).    [provider]  albuterol (PROVENTIL HFA;VENTOLIN HFA) 108 (90 Base) MCG/ACT inhaler Inhale 2 puffs into the lungs every 6 (six) hours as needed for wheezing or shortness of breath. 05/02/18   Fuller Plan A, MD  albuterol (PROVENTIL) (2.5 MG/3ML) 0.083% nebulizer solution Take 3 mLs (2.5 mg total) by nebulization every 6 (six) hours as needed for wheezing or shortness of breath. Patient not taking: Reported on 04/01/2021 05/02/18   Norval Morton, MD  Alcohol Swabs (B-D SINGLE USE SWABS REGULAR) PADS  08/10/19   [provider]  allopurinol (ZYLOPRIM) 100 MG tablet Take 1 tablet (100 mg total) by mouth daily. 03/14/21   Croitoru, [name], MD  aspirin EC 81 MG tablet Take 1 tablet (81 mg total) by mouth daily. 03/27/16   Erlene Quan, PA-C  bisoprolol (ZEBETA) 5 MG tablet Take 1.5 tablets (7.5 mg total) by mouth daily. 03/18/21   Milford, Maricela Bo, FNP  budesonide-formoterol (SYMBICORT) 160-4.5 MCG/ACT inhaler Inhale 2 puffs into the lungs 2 (two) times daily.    [provider]  carboxymethylcellulose  (REFRESH PLUS) 0.5 % SOLN Place 1 drop into both eyes every 12 (twelve) hours.    [provider]  clopidogrel (PLAVIX) 75 MG tablet Take 1 tablet (75 mg total) by mouth daily. 02/28/21   Croitoru, [name], MD  cyclobenzaprine (FLEXERIL) 10 MG tablet Take 5 mg by mouth 2 (two) times daily as needed for muscle spasms. Patient not taking: Reported on 04/01/2021 12/26/20   [provider]  Docusate Calcium (STOOL SOFTENER PO) Take 1 tablet by mouth at bedtime.    [provider]  empagliflozin (JARDIANCE) 10 MG TABS tablet Take 10 mg by mouth daily before breakfast. 06/30/19   Bensimhon, Shaune Pascal, MD  EPINEPHrine 0.3 mg/0.3 mL IJ SOAJ injection Inject 0.3 mg into the muscle once as needed for anaphylaxis (severe allergic reaction). Patient not taking: Reported on 04/01/2021    [provider]  ferrous sulfate 325 (65 FE) MG tablet Take 325 mg by mouth daily with breakfast.    [provider]  fluconazole (DIFLUCAN) 150 MG tablet Take 1 tablet (150 mg total) by mouth daily. 03/18/21   Milford, Maricela Bo, FNP  fluticasone (FLONASE) 50 MCG/ACT nasal spray Place 1 spray into both nostrils daily as needed. Patient not taking: Reported on 04/01/2021 03/22/20   [provider]  guaiFENesin (MUCINEX) 600 MG 12 hr tablet Take 1 tablet (600 mg total) by mouth 2 (two) times daily. 01/10/21   [name], NP  ipratropium (ATROVENT) 0.02 % nebulizer solution Take 2.5 mLs (0.5 mg total) by nebulization 4 (four) times daily. Patient taking differently: Take 0.5 mg by nebulization every 4 (four) hours as needed (Asthma). 05/02/18   Fuller Plan A, MD  ipratropium (ATROVENT) 0.06 % nasal spray Place into both nostrils. Patient not taking: Reported on 04/01/2021 10/24/20   [provider]  isosorbide mononitrate (IMDUR) 30 MG 24 hr tablet Take 90 mg by mouth daily.    [provider]  loratadine (CLARITIN) 10 MG tablet Take 10 mg by mouth daily. 11/23/19    [provider]  nitroGLYCERIN (  NITROSTAT) 0.4 MG SL tablet Place 1 tablet (0.4 mg total) under the tongue every 5 (five) minutes as needed for chest pain. Patient not taking: Reported on 04/01/2021 11/13/16 03/18/22  Erlene Quan, PA-C  nystatin cream (MYCOSTATIN) Apply 1 application topically 2 (two) times daily as needed (rash). Patient not taking: Reported on 04/01/2021 06/14/20   [provider]  OXYGEN Inhale 3 L/min into the lungs continuous.     [provider]  pantoprazole (PROTONIX) 40 MG tablet Take 1 tablet (40 mg total) by mouth 2 (two) times daily. 07/08/19   [name], MD  polyethylene glycol (MIRALAX / GLYCOLAX) 17 g packet Take 17 g by mouth daily.    [provider]  potassium chloride SA (KLOR-CON M) 20 MEQ tablet Take 1 tablet (20 mEq total) by mouth every morning. Patient not taking: Reported on 04/01/2021 03/23/21   Croitoru, [name], MD  predniSONE (DELTASONE) 5 MG tablet Take by mouth. Patient not taking: Reported on 04/01/2021 10/09/20   [provider]  ranolazine (RANEXA) 1000 MG SR tablet TAKE 1 TABLET BY MOUTH  TWICE DAILY 07/12/20   Croitoru, [name], MD  RESTASIS 0.05 % ophthalmic emulsion 1 drop daily as needed. 03/19/21   [provider]  rosuvastatin (CRESTOR) 40 MG tablet Take 1 tablet (40 mg total) by mouth every evening. NEED OV. 09/19/20   [name], [name], MD  sucralfate (CARAFATE) 1 g tablet Take 1 tablet (1 g total) by mouth in the morning, at noon, and at bedtime. As needed Patient not taking: Reported on 04/01/2021 12/05/20   [name], MD  torsemide 40 MG TABS Take 80 mg by mouth 2 (two) times daily. 04/01/21 06/30/21  Croitoru, Dani Gobble, MD    Physical Exam: Vitals:   04/03/21 1123 04/03/21 1130 04/03/21 1200 04/03/21 1230  BP: 122/62 124/65 125/63 131/71  Pulse: 75 72 74 80  Resp: (!) 23 17 10  (!) 24  Temp:      TempSrc:      SpO2: 98% 97% 96% 94%  Weight:      Height:        Exam  Constitutional: Morbidly obese female who appears to be in discomfort Eyes: PERRL, lids and conjunctivae normal ENMT: Mucous membranes are moist. Posterior pharynx clear of any exudate or lesions. Neck: normal, supple, no JVD Respiratory: Normal respiratory effort without significant wheezes or rhonchi appreciated.  Patient on 3 L of oxygen with O2 saturations maintained. Cardiovascular: Regular rate and rhythm.   +1 pitting bilateral lower extremity edema. Abdomen: no tenderness, no masses palpated.  Bowel sounds present. Musculoskeletal: Tenderness palpation of the left hip externally rotated and flexed at the knee unable to move due to pain. Skin: no rashes, lesions, ulcers. No induration Neurologic: CN 2-12 grossly intact. Sensation intact, DTR normal. Strength 5/5 in all 4.  Psychiatric: Normal judgment and insight. Alert and oriented x 3. Normal mood. '  Data Reviewed:  Results reviewed as noted above in HPI findings significant for mildly low potassium function slightly increased from baseline  Assessment and Plan: * Closed left hip fracture ([place]) secondary to fall at home- (present on admission) Patient presents after having a fall found to have a left hip and left superior pubic ramus fracture.  Orthopedics consulted with plans to possibly take patient to the operating room in a.m. based off the geriatric perioperative cardiac risk assessment score patient has 16.1% probability of perioperative cardiac arrest or myocardial infarction. -Admit to a surgical telemetry bed -Hip  fracture order set utilized -N.p.o. after midnight -Hydrocodone/morphine IV as needed pain -Transitions of care consulted -[place] cardiology consultative services, we will follow-up for any further recommendations   Hypokalemia- (present on admission) Acute.  On admission potassium was just mildly low at 3.4. -Give 40 mEq of potassium chloride x1 dose and then resume 20 mEq daily -Continue to  monitor and adjust doses as needed  Chronic kidney disease, stage III B (moderate) (HCC) On admission creatinine 1.69 which appears slightly above baseline which appears to be 1.3-1.5. -Continue to monitor kidney function  Closed fracture of left superior pubic ramus ([place])- (present on admission) Secondary to fall.  No surgical intervention recommended for treatment of this fracture.  Thrombocytopenia ([place])- (present on admission) Acute.  Platelet count 139 but appears to have been intermittently low previously in the past. -Continue to monitor  Chronic respiratory failure ([place])- (present on admission) Patient has history of obesity hypoventilation syndrome and OSA.  She is chronically on 3 L of oxygen at baseline. -Continue nasal cannula oxygen 3 L  Diabetes mellitus without complication (HCC) Last hemoglobin A1c was 6.5 less than 3 months ago.  Patient is on Jardiance 10 mg daily. -Hypoglycemic protocols -Held Jardiance -CBGs before every meal with sensitive SSI -Adjust insulin regimen as needed  CHB (complete heart block) ([place])- (present on admission) S/p PPM/ICD   Chronic diastolic CHF (congestive heart failure) ([place])- (present on admission) Chronic.  Last echocardiogram noted EF of 55 to 60% with grade 1 diastolic dysfunction in 09/9371.  BNP improved to 333.8 from 712 on 2/6.  Patient has some mild lower extremity edema but does not appear grossly fluid overloaded.  She had been seen by her cardiologist Dr. Burt Knack on 2/20 and recommended to increase torsemide to 80 mg daily. -Strict I&Os and daily weights -Continue current dose of Torsemide  S/P CABG x 5 Last cardiac catheter was in 02/2019 where all territories were noted to have adequate coronary flow via bypasses or native vessels with stents. -Continue aspirin, Plavix, beta-blocker, isosorbide mononitrate and Crestor  Mixed hypercholesterolemia and hypertriglyceridemia- (present on admission) -Continue Crestor  HTN  (hypertension), benign- (present on admission) Home medication regimen includes bisoprolol 7.5 mg daily, Imdur 90 mg daily, torsemide 80 mg daily.  ANEMIA- (present on admission) Hemoglobin 10.8 g/dL which appears around lower end of patient's baseline which ranges from 10- 12 g/dL. -Continue to monitor H&H       Advance Care Planning:   Code Status: Full Code   Consults: Orthopedics  Family Communication: Son updated at bedside Severity of Illness: The appropriate patient status for this patient is INPATIENT. Inpatient status is judged to be reasonable and necessary in order to provide the required intensity of service to ensure the patient's safety. The patient's presenting symptoms, physical exam findings, and initial radiographic and laboratory data in the context of their chronic comorbidities is felt to place them at high risk for further clinical deterioration. Furthermore, it is not anticipated that the patient will be medically stable for discharge from the hospital within 2 midnights of admission.   * I certify that at the point of admission it is my clinical judgment that the patient will require inpatient hospital care spanning beyond 2 midnights from the point of admission due to high intensity of service, high risk for further deterioration and high frequency of surveillance required.*  Author: Norval Morton, MD 04/03/2021 12:53 PM  For on call review [email or web address].

## 2021-04-03 NOTE — ED Notes (Signed)
Pt transported to [facility]

## 2021-04-03 NOTE — Consult Note (Addendum)
Reason for Consult:Left hip fx Referring Physician: Thamas Jaegers Time called: [id number] Time at bedside: [name] is an 76 y.o. female.  HPI: [name] was at home ambulating when her left leg gave out on her and she fell. She had immediate pain in her left hip and could not get up. She was brought to the ED where x-rays showed a left hip fx and orthopedic surgery was consulted. She lives with her son though he is not at home most of the time.  Past Medical History:  Diagnosis Date   ABDOMINAL PAIN -GENERALIZED 02/21/2010   Qualifier: Diagnosis of  By: Chester Holstein NP, Nevin Bloodgood     AICD (automatic cardioverter/defibrillator) present    downgraded to CRT-P in 2014   Anemia    Arthritis    Asthma    Atrial fibrillation ([place]) 10/24/2015   Questionable history of A Fib/A Flutter. On device interrogation on 9/7, showed 0.51min of A Fib/A Flutter with 1% burden.  Device check in Jan 2018 showed no sustained AF (runs of less than 30 seconds). No anticoagulation indicated.   CAD (coronary artery disease)    a. 10/09/16 LHC: SVG->LAD patent, SVG->Diag patent, SVG->RCA patent, SVG->LCx occluded. EF 60%, b. 10/31/16 LHC DES to AV groove Circ, DES to intermed branch   Cellulitis and abscess of foot 12/2014   RT FOOT   CHF (congestive heart failure) (HCC)    Chronic bronchitis (HCC)    Chronic lower back pain    Complete heart block ([place]) 06/22/2015   Diverticulosis    Facial numbness 02/2016   Fatty liver disease, nonalcoholic    Gastritis    GERD (gastroesophageal reflux disease)    Gout    H/O hiatal hernia    HEMORRHOIDS-EXTERNAL 02/21/2010   Qualifier: Diagnosis of  By: Chester Holstein NP, Paula     High cholesterol    Hyperplastic colon polyp    Hypertension    IBS (irritable bowel syndrome)    Internal hemorrhoids    INTERNAL HEMORRHOIDS WITHOUT MENTION COMP 04/12/2007   Qualifier: Diagnosis of  By: Olevia Perches MD, Dora M    Nonischemic cardiomyopathy ([place]) 11/22/2011   Pt responded to BiV  ICD- last EF 55-60% Sept 2017   Obesity    On home oxygen therapy    "2L at night" (10/31/2016)   OSA (obstructive sleep apnea)    "can't tolerate a mask" (10/30/2016)   PERSONAL HX COLONIC POLYPS 02/21/2010   Qualifier: Diagnosis of  By: Chester Holstein NP, Paula     Pneumonia    "couple times" (10/31/2016)   Presence of permanent cardiac pacemaker    11/02/12 [employer] V273 INTUA PPM   Right facial numbness 03/05/2016   Shortness of breath    Stroke ([place]) 09/2020   TIA (transient ischemic attack)    "recently" (10/31/2016)   Type II diabetes mellitus ([place])     Past Surgical History:  Procedure Laterality Date   ABDOMINAL ULTRASOUND  12/01/2011   Peripelvic cysts- #1- 2.4x1.9x2.3cm, #2-1.2x0.9x1.2cm   ANKLE FRACTURE SURGERY Right    "had rod put in"   ANTERIOR CERVICAL DECOMP/DISCECTOMY FUSION     APPENDECTOMY     BACK SURGERY     BIOPSY  12/29/2017   Procedure: BIOPSY;  Surgeon: [name], MD;  Location: [facility];  Service: Endoscopy;;   BIV ICD INSERTION CRT-D  2001?   BIV PACEMAKER GENERATOR CHANGE OUT N/A 11/02/2012   Procedure: BIV PACEMAKER GENERATOR CHANGE OUT;  Surgeon: Sanda Klein, MD;  Location: [facility];  Service: Cardiovascular;  Laterality: N/A;   CARDIAC CATHETERIZATION  05/17/1999   No significant coronary obstructive disease w/ mild 20% luminal irregularity of the first diag branch of the LAD   CARDIAC CATHETERIZATION  07/08/2002   No significant CAD, moderately depressed LV systolic function   CARDIAC CATHETERIZATION Bilateral 04/26/2007   Normal findings, recommend medical therapy   CARDIAC CATHETERIZATION  02/18/2008   Moderate CAD, would benefit from having a functional study, recommend continue medical therapy   CARDIAC CATHETERIZATION  07/23/2012   Medical therapy   CARDIAC CATHETERIZATION N/A 11/24/2014   Procedure: Left Heart Cath and Coronary Angiography;  Surgeon: Troy Sine, MD;  Location: [facility];  Service:  Cardiovascular;  Laterality: N/A;   CARDIAC CATHETERIZATION  11/27/2014   Procedure: Intravascular Pressure Wire/FFR Study;  Surgeon: Peter M Martinique, MD;  Location: [facility];  Service: Cardiovascular;;   CARDIAC CATHETERIZATION  10/09/2016   CHOLECYSTECTOMY N/A 04/09/2018   Procedure: LAPAROSCOPIC CHOLECYSTECTOMY;  Surgeon: Greer Pickerel, MD;  [facility];  Service: General;  Laterality: N/A;   COLONOSCOPY WITH PROPOFOL N/A 12/29/2017   Procedure: COLONOSCOPY WITH PROPOFOL;  Surgeon: [name], MD;  [facility];  Service: Endoscopy;  Laterality: N/A;   CORONARY ANGIOGRAM  2010   CORONARY ARTERY BYPASS GRAFT N/A 11/29/2014   Procedure: CORONARY ARTERY BYPASS GRAFTING x 5 (LIMA-LAD, SVG-D, SVG-OM1-OM2, SVG-PD);  Surgeon: Melrose Nakayama, MD;  Location: [facility];  Service: Open Heart Surgery;  Laterality: N/A;   CORONARY STENT INTERVENTION N/A 10/31/2016   Procedure: CORONARY STENT INTERVENTION;  Surgeon: Burnell Blanks, MD;  Location: [facility];  Service: Cardiovascular;  Laterality: N/A;   ESOPHAGOGASTRODUODENOSCOPY (EGD) WITH PROPOFOL N/A 12/29/2017   Procedure: ESOPHAGOGASTRODUODENOSCOPY (EGD) WITH PROPOFOL;  Surgeon: [name], MD;  [facility];  Service: Endoscopy;  Laterality: N/A;   FRACTURE SURGERY     INSERT / REPLACE / [place] ARTHROSCOPY Bilateral    LEFT HEART CATH AND CORS/GRAFTS ANGIOGRAPHY N/A 12/09/2017   Procedure: LEFT HEART CATH AND CORS/GRAFTS ANGIOGRAPHY;  Surgeon: Troy Sine, MD;  Location: [facility];  Service: Cardiovascular;  Laterality: N/A;   LEFT HEART CATHETERIZATION WITH CORONARY ANGIOGRAM N/A 07/23/2012   Procedure: LEFT HEART CATHETERIZATION WITH CORONARY ANGIOGRAM;  Surgeon: Leonie Man, MD;  Location: [facility] LAB;  Service: Cardiovascular;  Laterality: N/A;   LEXISCAN MYOVIEW  11/14/2011   Mild-moderate defect seen in Mid Inferolateral and Mid  Anterolateral regions-consistant w/ infarct/scar. No significant ischemia demonstrated.   POLYPECTOMY  12/29/2017   Procedure: POLYPECTOMY;  Surgeon: [name], MD;  Location: [facility];  Service: Endoscopy;;   PPM GENERATOR CHANGEOUT N/A 12/31/2020   Procedure: PPM GENERATOR CHANGEOUT;  Surgeon: Sanda Klein, MD;  Location: [facility];  Service: Cardiovascular;  Laterality: N/A;   RIGHT/LEFT HEART CATH AND CORONARY ANGIOGRAPHY N/A 10/09/2016   Procedure: RIGHT/LEFT HEART CATH AND CORONARY ANGIOGRAPHY;  Surgeon: Jolaine Artist, MD;  Location: [facility];  Service: Cardiovascular;  Laterality: N/A;   RIGHT/LEFT HEART CATH AND CORONARY/GRAFT ANGIOGRAPHY N/A 03/11/2019   Procedure: RIGHT/LEFT HEART CATH AND CORONARY/GRAFT ANGIOGRAPHY;  Surgeon: Jolaine Artist, MD;  Location: [facility];  Service: Cardiovascular;  Laterality: N/A;   TEE WITHOUT CARDIOVERSION N/A 11/29/2014   Procedure: TRANSESOPHAGEAL ECHOCARDIOGRAM (TEE);  Surgeon: Melrose Nakayama, MD;  Location: [facility];  Service: Open Heart Surgery;  Laterality: N/A;   TRANSCAROTID ARTERY REVASCULARIZATION  Right 10/01/2020   Procedure: RIGHT TRANSCAROTID ARTERY REVASCULARIZATION;  Surgeon: [name], MD;  Location: [facility] OR;  Service: Vascular;  Laterality: Right;   TRANSTHORACIC ECHOCARDIOGRAM  07/23/2012   EF 55-60%, normal-mild   TUBAL LIGATION     ULTRASOUND GUIDANCE FOR VASCULAR ACCESS Left 10/01/2020   Procedure: ULTRASOUND GUIDANCE FOR VASCULAR ACCESS;  Surgeon: [name], MD;  Location: [facility];  Service: Vascular;  Laterality: Left;    Family History  Problem Relation Age of Onset   Breast cancer Mother    Diabetes Mother    Heart disease Maternal Grandmother    Kidney disease Maternal Grandmother    Diabetes Maternal Grandmother    Glaucoma Maternal Aunt    Heart disease Maternal Aunt    Asthma Sister    Colon cancer Neg Hx    Stomach cancer Neg Hx     Pancreatic cancer Neg Hx     Social History:  reports that she quit smoking about 53 years ago. Her smoking use included cigarettes. She has a 0.75 pack-year smoking history. She has never used smokeless tobacco. She reports that she does not drink alcohol and does not use drugs.  Allergies:  Allergies  Allergen Reactions   Ivp Dye [Iodinated Contrast Media] Shortness Of Breath    No reaction to PO contrast with non-ionic dye.06-25-2014/rsm   Shellfish Allergy Anaphylaxis   Sulfa Antibiotics Shortness Of Breath   Iodine Hives   Atorvastatin Other (See Comments)    Pt states "causes bilateral leg pain/cramps."   Benadryl [Diphenhydramine] Other (See Comments)    "THIS DRIVES ME CRAZY AND MAKES ME FEEL LIKE I AM DYING" No problem last time they took   Colchicine Nausea And Vomiting   Contrast Media [Iodinated Contrast Media] Hives   Doxycycline Other (See Comments)    Unknown reaction, per patient   Uloric [Febuxostat] Other (See Comments)    Unknown reaction   Cephalexin Itching and Rash   Zithromax [Azithromycin] Rash    Medications: I have reviewed the patient's current medications.  Results for orders placed or performed during the hospital encounter of 04/03/21 (from the past 48 hour(s))  Resp Panel by RT-PCR (Flu A&B, Covid) Nasopharyngeal Swab     Status: None   Collection Time: 04/03/21  9:40 AM   Specimen: Nasopharyngeal Swab; Nasopharyngeal(NP) swabs in vial transport medium  Result Value Ref Range   SARS Coronavirus 2 by RT PCR NEGATIVE NEGATIVE    Comment: (NOTE) SARS-CoV-2 target nucleic acids are NOT DETECTED.  The SARS-CoV-2 RNA is generally detectable in upper respiratory specimens during the acute phase of infection. The lowest concentration of SARS-CoV-2 viral copies this assay can detect is 138 copies/mL. A negative result does not preclude SARS-Cov-2 infection and should not be used as the sole basis for treatment or other patient management decisions. A  negative result may occur with  improper specimen collection/handling, submission of specimen other than nasopharyngeal swab, presence of viral mutation(s) within the areas targeted by this assay, and inadequate number of viral copies(<138 copies/mL). A negative result must be combined with clinical observations, patient history, and epidemiological information. The expected result is Negative.  Fact Sheet for Patients:  EntrepreneurPulse.com.au  Fact Sheet for Healthcare Providers:  IncredibleEmployment.be  This test is no t yet approved or cleared by the [place] FDA and  has been authorized for detection and/or diagnosis of SARS-CoV-2 by FDA under an Emergency Use Authorization (EUA). This EUA  will remain  in effect (meaning this test can be used) for the duration of the COVID-19 declaration under Section 564(b)(1) of the Act, 21 U.S.C.section 360bbb-3(b)(1), unless the authorization is terminated  or revoked sooner.       Influenza A by PCR NEGATIVE NEGATIVE   Influenza B by PCR NEGATIVE NEGATIVE    Comment: (NOTE) The Xpert Xpress SARS-CoV-2/FLU/RSV plus assay is intended as an aid in the diagnosis of influenza from Nasopharyngeal swab specimens and should not be used as a sole basis for treatment. Nasal washings and aspirates are unacceptable for Xpert Xpress SARS-CoV-2/FLU/RSV testing.  Fact Sheet for Patients: EntrepreneurPulse.com.au  Fact Sheet for Healthcare Providers: IncredibleEmployment.be  This test is not yet approved or cleared by the [place] FDA and has been authorized for detection and/or diagnosis of SARS-CoV-2 by FDA under an Emergency Use Authorization (EUA). This EUA will remain in effect (meaning this test can be used) for the duration of the COVID-19 declaration under Section 564(b)(1) of the Act, 21 U.S.C. section 360bbb-3(b)(1), unless the authorization is terminated  or revoked.  Performed at [facility], [place] [address] [place], [place] [address]   CBC with Differential     Status: Abnormal   Collection Time: 04/03/21 10:00 AM  Result Value Ref Range   WBC 6.2 4.0 - 10.5 K/uL   RBC 3.75 (L) 3.87 - 5.11 MIL/uL   Hemoglobin 10.8 (L) 12.0 - 15.0 g/dL   HCT 37.2 36.0 - 46.0 %   MCV 99.2 80.0 - 100.0 fL   MCH 28.8 26.0 - 34.0 pg   MCHC 29.0 (L) 30.0 - 36.0 g/dL   RDW 14.4 11.5 - 15.5 %   Platelets 139 (L) 150 - 400 K/uL   nRBC 0.0 0.0 - 0.2 %   Neutrophils Relative % 77 %   Neutro Abs 4.8 1.7 - 7.7 K/uL   Lymphocytes Relative 12 %   Lymphs Abs 0.8 0.7 - 4.0 K/uL   Monocytes Relative 7 %   Monocytes Absolute 0.4 0.1 - 1.0 K/uL   Eosinophils Relative 2 %   Eosinophils Absolute 0.2 0.0 - 0.5 K/uL   Basophils Relative 1 %   Basophils Absolute 0.0 0.0 - 0.1 K/uL   Immature Granulocytes 1 %   Abs Immature Granulocytes 0.03 0.00 - 0.07 K/uL    Comment: Performed at [facility] [address] [place], [place] [address]  Comprehensive metabolic panel     Status: Abnormal   Collection Time: 04/03/21 10:00 AM  Result Value Ref Range   Sodium 137 135 - 145 mmol/L   Potassium 3.4 (L) 3.5 - 5.1 mmol/L   Chloride 97 (L) 98 - 111 mmol/L   CO2 33 (H) 22 - 32 mmol/L   Glucose, Bld 150 (H) 70 - 99 mg/dL    Comment: Glucose reference range applies only to samples taken after fasting for at least 8 hours.   BUN 18 8 - 23 mg/dL   Creatinine, Ser 1.69 (H) 0.44 - 1.00 mg/dL   Calcium 8.7 (L) 8.9 - 10.3 mg/dL   Total Protein 5.8 (L) 6.5 - 8.1 g/dL   Albumin 2.9 (L) 3.5 - 5.0 g/dL   AST 18 15 - 41 U/L   ALT 11 0 - 44 U/L   Alkaline Phosphatase 79 38 - 126 U/L   Total Bilirubin 0.5 0.3 - 1.2 mg/dL   GFR, Estimated 31 (L) >60 mL/min    Comment: (NOTE) Calculated using the CKD-EPI Creatinine Equation (  2021)    Anion gap 7 5 - 15    Comment: Performed at [facility], [place] [address] [place], [place] [address]   *Note: Due to a large  number of results and/or encounters for the requested time period, some results have not been displayed. A complete set of results can be found in Results Review.    DG Chest Portable 1 View  Result Date: 04/03/2021 CLINICAL DATA:  Fall, hip pain EXAM: PORTABLE CHEST 1 VIEW COMPARISON:  01/07/2021 FINDINGS: Right-sided implanted cardiac device. Stable cardiomegaly. Prior CABG. Aortic atherosclerosis. Mild pulmonary vascular congestion no focal airspace consolidation evident on portable supine view. No large pleural fluid collection. No evidence of a pneumothorax. IMPRESSION: Cardiomegaly with mild pulmonary vascular congestion. Electronically Signed   By: Davina Poke D.O.   On: 04/03/2021 10:11   CUP PACEART REMOTE DEVICE CHECK  Result Date: 04/03/2021 Scheduled remote reviewed. Normal device function.  Brief ATR's for FF oversensing Next remote 91 days. LA  DG Hip Unilat W or Wo Pelvis 2-3 Views Left  Result Date: 04/03/2021 CLINICAL DATA:  Fall today.  Left hip pain. EXAM: DG HIP (WITH OR WITHOUT PELVIS) 2-3V LEFT COMPARISON:  None. FINDINGS: A displaced intertrochanteric left hip fracture seen. No evidence of dislocation. Minimally displaced fracture is also seen involving the left superior pubic ramus. IMPRESSION: Displaced intertrochanteric left hip fracture. Left superior pubic ramus fracture. Electronically Signed   By: Marlaine Hind M.D.   On: 04/03/2021 10:34    Review of Systems  HENT:  Negative for ear discharge, ear pain, hearing loss and tinnitus.   Eyes:  Negative for photophobia and pain.  Respiratory:  Negative for cough and shortness of breath.   Cardiovascular:  Negative for chest pain.  Gastrointestinal:  Negative for abdominal pain, nausea and vomiting.  Genitourinary:  Negative for dysuria, flank pain, frequency and urgency.  Musculoskeletal:  Positive for arthralgias (Left hip). Negative for back pain, myalgias and neck pain.  Neurological:  Negative for dizziness and  headaches.  Hematological:  Does not bruise/bleed easily.  Psychiatric/Behavioral:  The patient is not nervous/anxious.   Blood pressure (!) 127/104, pulse 66, temperature 97.7 F (36.5 C), temperature source Oral, resp. rate 14, height 5\' 3"  (1.6 m), weight 103 kg, SpO2 96 %. Physical Exam Constitutional:      General: She is not in acute distress.    Appearance: She is well-developed. She is not diaphoretic.  HENT:     Head: Normocephalic and atraumatic.  Eyes:     General: No scleral icterus.       Right eye: No discharge.        Left eye: No discharge.     Conjunctiva/sclera: Conjunctivae normal.  Cardiovascular:     Rate and Rhythm: Normal rate and regular rhythm.  Pulmonary:     Effort: Pulmonary effort is normal. No respiratory distress.  Musculoskeletal:     Cervical back: Normal range of motion.     Comments: LLE No traumatic wounds, ecchymosis, or rash  Mod TTP hip, mild TTP knee  No knee or ankle effusion  Knee stable to varus/ valgus and anterior/posterior stress  Sens DPN, SPN, TN intact  Motor EHL, ext, flex, evers 5/5  DP 1+, PT 0, No significant edema  Skin:    General: Skin is warm and dry.  Neurological:     Mental Status: She is alert.  Psychiatric:        Mood and Affect: Mood normal.  Behavior: Behavior normal.    Assessment/Plan: Left hip fx -- Will need IMN, plan tomorrow with Dr. Sammuel Hines. Please keep NPO after MN. Left knee pain -- Will check x-rays Multiple medical problems -- Will need medical clearance before proceeding with surgery.    Lisette Abu, PA-C Orthopedic Surgery [phone] 04/03/2021, 11:18 AM

## 2021-04-03 NOTE — Assessment & Plan Note (Addendum)
-  Acute on Chronic Hypoxic/Hypercapneic Failure  -Patient has history of obesity hypoventilation syndrome and OSA.  She is chronically on 3 L of oxygen at baseline. -SpO2: 100 % O2 Flow Rate (L/min): 3 L/min FiO2 (%): (S) 35 %; O2 continues to Wean -Suspect this will continue to improve with volume removal from HD as she had 1500 mL removed the day before yesterday and will be undergoing dialysis tomorrow; back on her home O2 requirement  OSA/OHS  Wean HHF for O2 saturations 88-92%  BiPAP qHS Will need home BiPAP set up prior to discharge and will need either a NIV or BiPAP for discharge

## 2021-04-03 NOTE — Anesthesia Postprocedure Evaluation (Signed)
Anesthesia Post Note  Patient: [name]  Procedure(s) Performed: AN AD [facility]     Patient location during evaluation: Short Stay Anesthesia Type: MAC Level of consciousness: awake and alert and oriented Pain management: satisfactory to patient Vital Signs Assessment: post-procedure vital signs reviewed and stable Respiratory status: spontaneous breathing, nonlabored ventilation and patient connected to nasal cannula oxygen Cardiovascular status: blood pressure returned to baseline and stable Postop Assessment: no headache Anesthetic complications: no   No notable events documented.  Last Vitals:  Vitals:   04/03/21 1610 04/03/21 1615  BP: (!) 165/109 (!) 170/56  Pulse: 86 85  Resp: 20 15  Temp:    SpO2: 93% 95%    Last Pain:  Vitals:   04/03/21 1442  TempSrc:   PainSc: 9                  Isabella Roemmich A.

## 2021-04-03 NOTE — Assessment & Plan Note (Addendum)
-  Hemoglobin 10.8 g/dL which appears around lower end of patient's baseline which ranges from 10- 12 g/dL. -Now Hgb/Hct went from 8.7/27.1 -> 9.5/30.1 -> 9.0/29.6 -> 8.6/27.5 -> 8.1/25.8 on last check and will repeat in the AM  Anemia Panel showed iron level of 39, U IBC of 290, TIBC of 329, saturation ratios of 12%, ferritin level 206 -Continue to monitor H&H and continue to Monitor for S/Sx of Bleeding -Repeat CBC in the AM

## 2021-04-03 NOTE — Progress Notes (Signed)
Patient arrived to 5N15 in NAD, VS stable. Patient oriented to room and call bell in reach.

## 2021-04-03 NOTE — Assessment & Plan Note (Addendum)
-  On admission creatinine 1.69 which appears slightly above baseline which appears to be 1.3-1.5. -Went into Renal Failure and now had  Acute on chronic renal failure with anuria -Did not tolerate iHD due to hypotension so required CRRT 3/3-3/5.  Tolerated iHD 3/7 with net UF 2L and also 3/9 as well as 3/11 where she had 1500 mL removed   Primary management per nephrology ? Last HD done 3/11 with Next HD 3/14  Continue Midodrine 10 mg po TID and will continue

## 2021-04-03 NOTE — Assessment & Plan Note (Addendum)
-  Last cardiac catheterization was in 02/2019 where all territories were noted to have adequate coronary flow via bypasses or native vessels with stents. -Continue aspirin, Plavix, and Crestor -BB and Isororbide held due to Hypotension earlier but may resume slowly

## 2021-04-03 NOTE — ED Notes (Signed)
Hospitalist at bedside 

## 2021-04-03 NOTE — Assessment & Plan Note (Addendum)
-  Home medication regimen includes bisoprolol 7.5 mg daily, Imdur 90 mg daily, torsemide 80 mg daily which were held due to her softer BP -GDMT for Chronic Diastolic Heart Failure held due to Hypotension and will resume as Able but BP still on the soft side

## 2021-04-03 NOTE — Assessment & Plan Note (Signed)
Continue Crestor 

## 2021-04-03 NOTE — Anesthesia Preprocedure Evaluation (Addendum)
Anesthesia Evaluation  Patient identified by MRN, date of birth, ID band Patient awake    Reviewed: Allergy & Precautions, NPO status , Patient's Chart, lab work & pertinent test results, reviewed documented beta blocker date and time   Airway Mallampati: IV  TM Distance: >3 FB Neck ROM: Full    Dental  (+) Upper Dentures, Edentulous Lower   Pulmonary shortness of breath and with exertion, asthma , sleep apnea and Continuous Positive Airway Pressure Ventilation , pneumonia, resolved, former smoker,    breath sounds clear to auscultation + decreased breath sounds      Cardiovascular hypertension, Pt. on medications + angina with exertion + CAD, + Cardiac Stents, + CABG and +CHF  Normal cardiovascular exam+ dysrhythmias Atrial Fibrillation + pacemaker + Cardiac Defibrillator  Rhythm:Regular Rate:Normal  Echo 09/17/20 1. Left ventricular ejection fraction, by estimation, is 55 to 60%. The left ventricle has normal function. The left ventricle has no regional wall motion abnormalities. Left ventricular diastolic parameters are consistent with Grade I diastolic dysfunction (impaired relaxation). Elevated left atrial pressure.  2. Right ventricular systolic function is normal. The right ventricular size is normal.  3. Left atrial size was moderately dilated.  4. The mitral valve is normal in structure. Trivial mitral valve regurgitation. No evidence of mitral stenosis. Moderate mitral annular calcification.  5. The aortic valve has an indeterminant number of cusps. Aortic valve regurgitation is trivial. Mild aortic valve stenosis.  6. The inferior vena cava is normal in size with greater than 50% respiratory variability, suggesting right atrial pressure of 3 mmHg.   EKG 03/18/21 Biventricular paced rhythm.  Stents Right Carotid, Cardiac x 2  CABG x 5 3 years ago   Neuro/Psych TIACVA negative psych ROS   GI/Hepatic Neg liver ROS,  hiatal hernia, GERD  Medicated and Controlled,  Endo/Other  diabetes, Poorly Controlled, Type 2, Oral Hypoglycemic AgentsMorbid obesity  Renal/GU Renal InsufficiencyRenal disease  negative genitourinary   Musculoskeletal  (+) Arthritis , Osteoarthritis,    Abdominal   Peds  Hematology  (+) Blood dyscrasia, anemia ,   Anesthesia Other Findings   Reproductive/Obstetrics                           Anesthesia Physical Anesthesia Plan  ASA: 3  Anesthesia Plan: MAC   Post-op Pain Management:    Induction: Intravenous  PONV Risk Score and Plan: 3 and Treatment may vary due to age or medical condition  Airway Management Planned: Natural Airway and Nasal Cannula  Additional Equipment:   Intra-op Plan:   Post-operative Plan:   Informed Consent: I have reviewed the patients History and Physical, chart, labs and discussed the procedure including the risks, benefits and alternatives for the proposed anesthesia with the patient or authorized representative who has indicated his/her understanding and acceptance.     Dental advisory given  Plan Discussed with: CRNA and Anesthesiologist  Anesthesia Plan Comments:         Anesthesia Quick Evaluation

## 2021-04-03 NOTE — Plan of Care (Signed)

## 2021-04-03 NOTE — Anesthesia Procedure Notes (Signed)
Anesthesia Regional Block: Femoral nerve block   Pre-Anesthetic Checklist: , timeout performed,  Correct Patient, Correct Site, Correct Laterality,  Correct Procedure, Correct Position, site marked,  Risks and benefits discussed,  Surgical consent,  Pre-op evaluation,  At surgeon's request  Laterality: Left  Prep: chloraprep       Needles:  Injection technique: Single-shot  Needle Type: Echogenic Stimulator Needle     Needle Length: 10cm  Needle Gauge: 21   Needle insertion depth: 9 cm   Additional Needles:   Procedures:,,,, ultrasound used (permanent image in chart),,    Narrative:  Start time: 04/03/2021 3:50 PM End time: 04/03/2021 4:10 PM Injection made incrementally with aspirations every 5 mL.  Performed by: Personally  Anesthesiologist: Josephine Igo, MD  Additional Notes: Timeout performed. Patient sedated. Relevant anatomy ID'd using [place]. Incremental 2-65ml injection of LA with frequent aspiration. Patient tolerated procedure well.    Left Femoral Nerve Block

## 2021-04-03 NOTE — Assessment & Plan Note (Deleted)
Last EF noted to be 55 to 60% with grade 1 diastolic dysfunction per echocardiogram in 09/2020.  BNP was 333.8, but improved from prior of 721 on 2/6 since increasing diuretic.

## 2021-04-03 NOTE — Assessment & Plan Note (Addendum)
-  Patient presents after having a fall found to have a left hip and left superior pubic ramus fracture.  Orthopedics consulted with plans to possibly take patient to the operating room in a.m. based off the geriatric perioperative cardiac risk assessment score patient has 16.1% probability of perioperative cardiac arrest or myocardial infarction. -Admitted to a surgical telemetry bed -Hip fracture order set utilized and now s/p ORIF -Hydrocodone/morphine IV as needed pain; Plan is for Staple Removal 04/22/21 -Transitions of care consulted as pateint will need SNF and patient's son has selected [facility]

## 2021-04-03 NOTE — Assessment & Plan Note (Addendum)
-  Improved. K+ is now 4.5 -Continue to monitor and trend -Repeat CMP in the AM

## 2021-04-03 NOTE — Assessment & Plan Note (Addendum)
-  Chronic.  Last echocardiogram noted EF of 55 to 60% with grade 1 diastolic dysfunction in 08/5168.  BNP improved to 333.8 from 712 on 2/6.  Patient has some mild lower extremity edema but does not appear grossly fluid overloaded.  She had been seen by her cardiologist Dr. Burt Knack on 2/20 and recommended to increase Torsemide to 80 mg daily but now stoped due to soft BP and because patient getting Dialysis.  -Strict I&Os and daily weights; She is -10.190 Liters -Continued current dose of Torsemide but no longer on it and getting Volume Mainteance with HD

## 2021-04-03 NOTE — ED Triage Notes (Signed)
Pt BIB EMS due to a fall at home. Pt is at 3L at baseline and tripped over O2 cord. Pt denies hitting her head. Pt is axox4. Pt c/o of left hip pain that's shortened and rotated. Pt has pedal pulse. Pt received 147mcgs of fentanyl.  Pt has pacemaker and is paced at 68.

## 2021-04-03 NOTE — Assessment & Plan Note (Addendum)
-  Secondary to fall.  No surgical intervention recommended for treatment of this fracture.

## 2021-04-03 NOTE — Assessment & Plan Note (Addendum)
-  Improved. Platelet Count is now stable at 167 on last check and has not been checked in a few days so we will be checking tomorrow -Continue to Monitor for S/Sx of Bleeding; no overt bleeding noted -Repeat CBC in the AM

## 2021-04-03 NOTE — Assessment & Plan Note (Signed)
S/p PPM/ICD

## 2021-04-03 NOTE — Assessment & Plan Note (Addendum)
Last hemoglobin A1c was 6.5 less than 3 months ago.  Patient is on Jardiance 10 mg daily. -Hypoglycemic protocols -Held Jardiance -CBGs before every meal with sensitive SSI -Adjust insulin regimen as needed -CBGs ranging from 103-143 now

## 2021-04-04 ENCOUNTER — Inpatient Hospital Stay (HOSPITAL_COMMUNITY): Payer: Medicare Other | Admitting: Certified Registered Nurse Anesthetist

## 2021-04-04 ENCOUNTER — Inpatient Hospital Stay (HOSPITAL_COMMUNITY): Payer: Medicare Other

## 2021-04-04 ENCOUNTER — Encounter (HOSPITAL_COMMUNITY): Payer: Self-pay | Admitting: Internal Medicine

## 2021-04-04 ENCOUNTER — Other Ambulatory Visit: Payer: Self-pay

## 2021-04-04 ENCOUNTER — Encounter (HOSPITAL_COMMUNITY)
Admission: EM | Disposition: A | Discharge status: Skilled Nursing Facility | Payer: Self-pay | Source: Home / Self Care | Attending: Internal Medicine

## 2021-04-04 DIAGNOSIS — S72142A Displaced intertrochanteric fracture of left femur, initial encounter for closed fracture: Secondary | ICD-10-CM | POA: Diagnosis not present

## 2021-04-04 DIAGNOSIS — I509 Heart failure, unspecified: Secondary | ICD-10-CM

## 2021-04-04 DIAGNOSIS — I110 Hypertensive heart disease with heart failure: Secondary | ICD-10-CM | POA: Diagnosis not present

## 2021-04-04 DIAGNOSIS — S72002A Fracture of unspecified part of neck of left femur, initial encounter for closed fracture: Secondary | ICD-10-CM | POA: Diagnosis not present

## 2021-04-04 DIAGNOSIS — I25119 Atherosclerotic heart disease of native coronary artery with unspecified angina pectoris: Secondary | ICD-10-CM

## 2021-04-04 HISTORY — PX: INTRAMEDULLARY (IM) NAIL INTERTROCHANTERIC: SHX5875

## 2021-04-04 LAB — CBC
HCT: 34.1 % — ABNORMAL LOW (ref 36.0–46.0)
Hemoglobin: 10 g/dL — ABNORMAL LOW (ref 12.0–15.0)
MCH: 29 pg (ref 26.0–34.0)
MCHC: 29.3 g/dL — ABNORMAL LOW (ref 30.0–36.0)
MCV: 98.8 fL (ref 80.0–100.0)
Platelets: 156 10*3/uL (ref 150–400)
RBC: 3.45 MIL/uL — ABNORMAL LOW (ref 3.87–5.11)
RDW: 14.6 % (ref 11.5–15.5)
WBC: 7.1 10*3/uL (ref 4.0–10.5)
nRBC: 0 % (ref 0.0–0.2)

## 2021-04-04 LAB — BASIC METABOLIC PANEL
Anion gap: 7 (ref 5–15)
BUN: 18 mg/dL (ref 8–23)
CO2: 34 mmol/L — ABNORMAL HIGH (ref 22–32)
Calcium: 8.7 mg/dL — ABNORMAL LOW (ref 8.9–10.3)
Chloride: 99 mmol/L (ref 98–111)
Creatinine, Ser: 1.75 mg/dL — ABNORMAL HIGH (ref 0.44–1.00)
GFR, Estimated: 30 mL/min — ABNORMAL LOW (ref >60)
Glucose, Bld: 138 mg/dL — ABNORMAL HIGH (ref 70–99)
Potassium: 4.1 mmol/L (ref 3.5–5.1)
Sodium: 140 mmol/L (ref 135–145)

## 2021-04-04 LAB — SURGICAL PCR SCREEN
MRSA, PCR: NEGATIVE
Staphylococcus aureus: NEGATIVE

## 2021-04-04 LAB — GLUCOSE, CAPILLARY
Glucose-Capillary: 134 mg/dL — ABNORMAL HIGH (ref 70–99)
Glucose-Capillary: 115 mg/dL — ABNORMAL HIGH (ref 70–99)
Glucose-Capillary: 116 mg/dL — ABNORMAL HIGH (ref 70–99)
Glucose-Capillary: 130 mg/dL — ABNORMAL HIGH (ref 70–99)
Glucose-Capillary: 149 mg/dL — ABNORMAL HIGH (ref 70–99)
Glucose-Capillary: 171 mg/dL — ABNORMAL HIGH (ref 70–99)

## 2021-04-04 SURGERY — FIXATION, FRACTURE, INTERTROCHANTERIC, WITH INTRAMEDULLARY ROD
Anesthesia: General | Laterality: Left

## 2021-04-04 MED ORDER — DEXAMETHASONE SODIUM PHOSPHATE 10 MG/ML IJ SOLN
INTRAMUSCULAR | Status: AC
  Filled 2021-04-04: qty 1

## 2021-04-04 MED ORDER — TRANEXAMIC ACID-NACL 1000-0.7 MG/100ML-% IV SOLN
1000.0000 mg | INTRAVENOUS | Status: AC
  Administered 2021-04-04: 1000 mg via INTRAVENOUS

## 2021-04-04 MED ORDER — DEXAMETHASONE SODIUM PHOSPHATE 10 MG/ML IJ SOLN
INTRAMUSCULAR | Status: DC | PRN
  Administered 2021-04-04: 10 mg via INTRAVENOUS

## 2021-04-04 MED ORDER — FENTANYL CITRATE (PF) 250 MCG/5ML IJ SOLN
INTRAMUSCULAR | Status: DC | PRN
Start: 2021-04-04 — End: 2021-04-04
  Administered 2021-04-04: 50 ug via INTRAVENOUS
  Administered 2021-04-04: 25 ug via INTRAVENOUS
  Administered 2021-04-04: 100 ug via INTRAVENOUS
  Administered 2021-04-04: 50 ug via INTRAVENOUS
  Administered 2021-04-04: 25 ug via INTRAVENOUS

## 2021-04-04 MED ORDER — ONDANSETRON HCL 4 MG/2ML IJ SOLN
INTRAMUSCULAR | Status: AC
  Filled 2021-04-04: qty 2

## 2021-04-04 MED ORDER — CEFAZOLIN SODIUM-DEXTROSE 2-4 GM/100ML-% IV SOLN
2.0000 g | INTRAVENOUS | Status: DC
  Filled 2021-04-04: qty 100

## 2021-04-04 MED ORDER — OXYCODONE HCL 5 MG PO TABS: 5.0000 mg | ORAL_TABLET | Freq: Once | ORAL | Status: DC | PRN

## 2021-04-04 MED ORDER — OXYCODONE HCL 5 MG/5ML PO SOLN: 5.0000 mg | Freq: Once | ORAL | Status: DC | PRN

## 2021-04-04 MED ORDER — 0.9 % SODIUM CHLORIDE (POUR BTL) OPTIME
TOPICAL | Status: DC | PRN
  Administered 2021-04-04: 1000 mL

## 2021-04-04 MED ORDER — LIDOCAINE 2% (20 MG/ML) 5 ML SYRINGE
INTRAMUSCULAR | Status: AC
  Filled 2021-04-04: qty 5

## 2021-04-04 MED ORDER — LIDOCAINE 2% (20 MG/ML) 5 ML SYRINGE
INTRAMUSCULAR | Status: DC | PRN
  Administered 2021-04-04: 60 mg via INTRAVENOUS

## 2021-04-04 MED ORDER — FENTANYL CITRATE (PF) 100 MCG/2ML IJ SOLN
INTRAMUSCULAR | Status: AC
  Filled 2021-04-04: qty 2

## 2021-04-04 MED ORDER — CHLORHEXIDINE GLUCONATE 0.12 % MT SOLN: 15.0000 mL | Freq: Once | OROMUCOSAL | Status: AC

## 2021-04-04 MED ORDER — ROCURONIUM BROMIDE 10 MG/ML (PF) SYRINGE
PREFILLED_SYRINGE | INTRAVENOUS | Status: DC | PRN
  Administered 2021-04-04: 70 mg via INTRAVENOUS
  Administered 2021-04-04: 10 mg via INTRAVENOUS

## 2021-04-04 MED ORDER — ALBUMIN HUMAN 5 % IV SOLN: INTRAVENOUS | Status: DC | PRN

## 2021-04-04 MED ORDER — HYDROMORPHONE HCL 1 MG/ML IJ SOLN
INTRAMUSCULAR | Status: AC
  Filled 2021-04-04: qty 0.5

## 2021-04-04 MED ORDER — INSULIN ASPART 100 UNIT/ML IJ SOLN: 0.0000 [IU] | INTRAMUSCULAR | Status: DC | PRN

## 2021-04-04 MED ORDER — PROPOFOL 10 MG/ML IV BOLUS
INTRAVENOUS | Status: DC | PRN
  Administered 2021-04-04: 100 mg via INTRAVENOUS

## 2021-04-04 MED ORDER — FENTANYL CITRATE (PF) 100 MCG/2ML IJ SOLN
25.0000 ug | INTRAMUSCULAR | Status: DC | PRN
  Administered 2021-04-04: 25 ug via INTRAVENOUS
  Administered 2021-04-04: 50 ug via INTRAVENOUS

## 2021-04-04 MED ORDER — EPHEDRINE SULFATE (PRESSORS) 50 MG/ML IJ SOLN
INTRAMUSCULAR | Status: DC | PRN
  Administered 2021-04-04 (×3): 10 mg via INTRAVENOUS

## 2021-04-04 MED ORDER — SODIUM CHLORIDE 0.9 % IR SOLN
Status: DC | PRN
  Administered 2021-04-04: 1000 mL

## 2021-04-04 MED ORDER — PROPOFOL 10 MG/ML IV BOLUS
INTRAVENOUS | Status: AC
  Filled 2021-04-04: qty 20

## 2021-04-04 MED ORDER — VANCOMYCIN HCL 1000 MG IV SOLR
INTRAVENOUS | Status: DC | PRN
  Administered 2021-04-04: 1000 mg via TOPICAL

## 2021-04-04 MED ORDER — ONDANSETRON HCL 4 MG/2ML IJ SOLN
4.0000 mg | Freq: Four times a day (QID) | INTRAMUSCULAR | Status: DC | PRN
  Administered 2021-04-04 – 2021-04-25 (×3): 4 mg via INTRAVENOUS
  Filled 2021-04-04 (×5): qty 2

## 2021-04-04 MED ORDER — VANCOMYCIN HCL 1000 MG IV SOLR
INTRAVENOUS | Status: AC
  Filled 2021-04-04: qty 20

## 2021-04-04 MED ORDER — CHLORHEXIDINE GLUCONATE 4 % EX LIQD
60.0000 mL | Freq: Once | CUTANEOUS | Status: DC
  Filled 2021-04-04: qty 60

## 2021-04-04 MED ORDER — TRANEXAMIC ACID-NACL 1000-0.7 MG/100ML-% IV SOLN
INTRAVENOUS | Status: AC
  Filled 2021-04-04: qty 100

## 2021-04-04 MED ORDER — LACTATED RINGERS IV SOLN: INTRAVENOUS | Status: DC

## 2021-04-04 MED ORDER — PHENYLEPHRINE HCL (PRESSORS) 10 MG/ML IV SOLN
INTRAVENOUS | Status: DC | PRN
  Administered 2021-04-04: 120 ug via INTRAVENOUS
  Administered 2021-04-04: 80 ug via INTRAVENOUS
  Administered 2021-04-04 (×3): 120 ug via INTRAVENOUS

## 2021-04-04 MED ORDER — FENTANYL CITRATE (PF) 250 MCG/5ML IJ SOLN
INTRAMUSCULAR | Status: AC
  Filled 2021-04-04: qty 5

## 2021-04-04 MED ORDER — ORAL CARE MOUTH RINSE: 15.0000 mL | Freq: Once | OROMUCOSAL | Status: AC

## 2021-04-04 MED ORDER — CHLORHEXIDINE GLUCONATE 0.12 % MT SOLN
OROMUCOSAL | Status: AC
  Administered 2021-04-04: 15 mL via OROMUCOSAL
  Filled 2021-04-04: qty 15

## 2021-04-04 MED ORDER — SUGAMMADEX SODIUM 200 MG/2ML IV SOLN
INTRAVENOUS | Status: DC | PRN
  Administered 2021-04-04 (×2): 200 mg via INTRAVENOUS

## 2021-04-04 MED ORDER — PHENYLEPHRINE HCL-NACL 20-0.9 MG/250ML-% IV SOLN
INTRAVENOUS | Status: DC | PRN
  Administered 2021-04-04: 50 ug/min via INTRAVENOUS

## 2021-04-04 MED ORDER — ONDANSETRON HCL 4 MG/2ML IJ SOLN
INTRAMUSCULAR | Status: DC | PRN
  Administered 2021-04-04: 4 mg via INTRAVENOUS

## 2021-04-04 MED ORDER — ROCURONIUM BROMIDE 10 MG/ML (PF) SYRINGE
PREFILLED_SYRINGE | INTRAVENOUS | Status: AC
  Filled 2021-04-04: qty 10

## 2021-04-04 MED ORDER — ONDANSETRON HCL 4 MG/2ML IJ SOLN: 4.0000 mg | Freq: Once | INTRAMUSCULAR | Status: DC | PRN

## 2021-04-04 SURGICAL SUPPLY — 42 items
BAG COUNTER SPONGE SURGICOUNT (BAG) ×2 IMPLANT
BIT DRILL CALIBRTD FREE HND4.3 (BIT) IMPLANT
BIT DRILL LAG SCREW (DRILL) IMPLANT
BLADE SURG 15 STRL LF DISP TIS (BLADE) ×1 IMPLANT
BLADE SURG 15 STRL SS (BLADE) ×2
CORTICAL BONE SCR 5.0MM X 46MM (Screw) ×2 IMPLANT
COVER BACK TABLE 60X90IN (DRAPES) ×2 IMPLANT
COVER PERINEAL POST (MISCELLANEOUS) ×2 IMPLANT
COVER SURGICAL LIGHT HANDLE (MISCELLANEOUS) ×4 IMPLANT
DRAPE C-ARM 42X72 X-RAY (DRAPES) ×2 IMPLANT
DRAPE STERI IOBAN 125X83 (DRAPES) ×2 IMPLANT
DRESSING MEPILEX FLEX 4X4 (GAUZE/BANDAGES/DRESSINGS) IMPLANT
DRILL CALIBRATED FREE HAND 4.3 (BIT) ×2
DRILL LAG SCREW (DRILL) ×2
DRSG ADAPTIC 3X8 NADH LF (GAUZE/BANDAGES/DRESSINGS) ×1 IMPLANT
DRSG MEPILEX BORDER 4X4 (GAUZE/BANDAGES/DRESSINGS) ×2 IMPLANT
DRSG MEPILEX BORDER 4X8 (GAUZE/BANDAGES/DRESSINGS) ×2 IMPLANT
DRSG MEPILEX FLEX 4X4 (GAUZE/BANDAGES/DRESSINGS) ×6
ELECT REM PT RETURN 9FT ADLT (ELECTROSURGICAL) ×2
ELECTRODE REM PT RTRN 9FT ADLT (ELECTROSURGICAL) ×1 IMPLANT
EVACUATOR 1/8 PVC DRAIN (DRAIN) IMPLANT
GLOVE SURG ORTHO LTX SZ9 (GLOVE) ×2 IMPLANT
GLOVE SURG UNDER POLY LF SZ9 (GLOVE) ×2 IMPLANT
GOWN STRL REUS W/ TWL XL LVL3 (GOWN DISPOSABLE) ×3 IMPLANT
GOWN STRL REUS W/TWL XL LVL3 (GOWN DISPOSABLE) ×6
GUIDEPIN VERSANAIL DSP 3.2X444 (ORTHOPEDIC DISPOSABLE SUPPLIES) ×2 IMPLANT
GUIDEWIRE 3.0X100MM BALL TIP (WIRE) ×1 IMPLANT
KIT BASIN OR (CUSTOM PROCEDURE TRAY) ×2 IMPLANT
KIT TURNOVER KIT B (KITS) ×2 IMPLANT
MANIFOLD NEPTUNE II (INSTRUMENTS) ×2 IMPLANT
NAIL HIP AFFIXUS 11X360 125D (Nail) ×1 IMPLANT
NS IRRIG 1000ML POUR BTL (IV SOLUTION) ×2 IMPLANT
PACK GENERAL/GYN (CUSTOM PROCEDURE TRAY) ×2 IMPLANT
PAD ARMBOARD 7.5X6 YLW CONV (MISCELLANEOUS) ×4 IMPLANT
SCREW BONE CORTICAL 5.0X50 (Screw) ×1 IMPLANT
SCREW CORTICL BON 5.0MM X 46MM (Screw) IMPLANT
SCREW LAG 10.5MMX105MM HFN (Screw) ×1 IMPLANT
SCREWDRIVER HEX TIP 3.5MM (MISCELLANEOUS) ×1 IMPLANT
SET CYSTO W/LG BORE CLAMP LF (SET/KITS/TRAYS/PACK) ×1 IMPLANT
STAPLER VISISTAT 35W (STAPLE) ×1 IMPLANT
SUT VIC AB 2-0 CTB1 (SUTURE) IMPLANT
WATER STERILE IRR 1000ML POUR (IV SOLUTION) ×4 IMPLANT

## 2021-04-04 NOTE — TOC CAGE-AID Note (Signed)
Transition of Care [facility]) - CAGE-AID Screening   Patient Details  Name: [name] MRN: [id number] Date of Birth: 1945-11-18  Transition of Care [facility]) CM/SW Contact:    Haadiya Frogge C Tarpley-Carter, Faith Phone Number: 04/04/2021, 1:47 PM   Clinical Narrative: Pt participated in [name]  Pt stated she does not use substance or ETOH.  Pt was not offered resources, due to no usage of substance or ETOH.    Marlise Fahr Tarpley-Carter, MSW, LCSW-A Pronouns:  She/Her/Hers [place] Transitions of Care Clinical Social Worker Direct Number:  [phone] [email or web address]  CAGE-AID Screening:    Have You Ever Felt You Ought to Cut Down on Your Drinking or Drug Use?: No Have People Annoyed You By [employer] Your Drinking Or Drug Use?: No Have You Felt Bad Or Guilty About Your Drinking Or Drug Use?: No Have You Ever Had a Drink or Used Drugs First Thing In The Morning to Steady Your Nerves or to Get Rid of a Hangover?: No CAGE-AID Score: 0  Substance Abuse Education Offered: No

## 2021-04-04 NOTE — H&P (View-Only) (Signed)
ORTHOPAEDIC CONSULTATION  REQUESTING PHYSICIAN: Terrilee Croak, MD  Chief Complaint: Left hip pain  HPI: [name] is a 76 y.o. female who presents with left hip pain and displaced intertrochanteric hip fracture after a fall while at home.  She states that she was at home walking when the leg gave out she immediately felt pain and inability to put weight on it.  She lives at home with her son although he is not around available as he works significantly.  She is otherwise a predominantly home occasionally community ambulator without assistive devices.  Past Medical History:  Diagnosis Date   ABDOMINAL PAIN -GENERALIZED 02/21/2010   Qualifier: Diagnosis of  By: Chester Holstein NP, Nevin Bloodgood     AICD (automatic cardioverter/defibrillator) present    downgraded to CRT-P in 2014   Anemia    Arthritis    Asthma    Atrial fibrillation ([place]) 10/24/2015   Questionable history of A Fib/A Flutter. On device interrogation on 9/7, showed 0.72min of A Fib/A Flutter with 1% burden.  Device check in Jan 2018 showed no sustained AF (runs of less than 30 seconds). No anticoagulation indicated.   CAD (coronary artery disease)    a. 10/09/16 LHC: SVG->LAD patent, SVG->Diag patent, SVG->RCA patent, SVG->LCx occluded. EF 60%, b. 10/31/16 LHC DES to AV groove Circ, DES to intermed branch   Cellulitis and abscess of foot 12/2014   RT FOOT   CHF (congestive heart failure) (HCC)    Chronic bronchitis (HCC)    Chronic lower back pain    Complete heart block ([place]) 06/22/2015   Diverticulosis    Facial numbness 02/2016   Fatty liver disease, nonalcoholic    Gastritis    GERD (gastroesophageal reflux disease)    Gout    H/O hiatal hernia    HEMORRHOIDS-EXTERNAL 02/21/2010   Qualifier: Diagnosis of  By: Chester Holstein NP, Paula     High cholesterol    Hyperplastic colon polyp    Hypertension    IBS (irritable bowel syndrome)    Internal hemorrhoids    INTERNAL HEMORRHOIDS WITHOUT MENTION COMP 04/12/2007    Qualifier: Diagnosis of  By: Olevia Perches MD, Dora M    Nonischemic cardiomyopathy ([place]) 11/22/2011   Pt responded to BiV ICD- last EF 55-60% Sept 2017   Obesity    On home oxygen therapy    "2L at night" (10/31/2016)   OSA (obstructive sleep apnea)    "can't tolerate a mask" (10/30/2016)   PERSONAL HX COLONIC POLYPS 02/21/2010   Qualifier: Diagnosis of  By: Chester Holstein NP, Paula     Pneumonia    "couple times" (10/31/2016)   Presence of permanent cardiac pacemaker    11/02/12 [employer] V273 INTUA PPM   Right facial numbness 03/05/2016   Shortness of breath    Stroke ([place]) 09/2020   TIA (transient ischemic attack)    "recently" (10/31/2016)   Type II diabetes mellitus ([place])    Past Surgical History:  Procedure Laterality Date   ABDOMINAL ULTRASOUND  12/01/2011   Peripelvic cysts- #1- 2.4x1.9x2.3cm, #2-1.2x0.9x1.2cm   ANKLE FRACTURE SURGERY Right    "had rod put in"   ANTERIOR CERVICAL DECOMP/DISCECTOMY FUSION     APPENDECTOMY     BACK SURGERY     BIOPSY  12/29/2017   Procedure: BIOPSY;  Surgeon: [name], MD;  Location: [facility];  Service: Endoscopy;;   BIV ICD INSERTION CRT-D  2001?   BIV PACEMAKER GENERATOR CHANGE OUT N/A 11/02/2012   Procedure: BIV PACEMAKER GENERATOR CHANGE  OUT;  Surgeon: Sanda Klein, MD;  Location: [facility] CATH LAB;  Service: Cardiovascular;  Laterality: N/A;   CARDIAC CATHETERIZATION  05/17/1999   No significant coronary obstructive disease w/ mild 20% luminal irregularity of the first diag branch of the LAD   CARDIAC CATHETERIZATION  07/08/2002   No significant CAD, moderately depressed LV systolic function   CARDIAC CATHETERIZATION Bilateral 04/26/2007   Normal findings, recommend medical therapy   CARDIAC CATHETERIZATION  02/18/2008   Moderate CAD, would benefit from having a functional study, recommend continue medical therapy   CARDIAC CATHETERIZATION  07/23/2012   Medical therapy   CARDIAC CATHETERIZATION N/A 11/24/2014   Procedure: Left Heart  Cath and Coronary Angiography;  Surgeon: Troy Sine, MD;  Location: [place] CV LAB;  Service: Cardiovascular;  Laterality: N/A;   CARDIAC CATHETERIZATION  11/27/2014   Procedure: Intravascular Pressure Wire/FFR Study;  Surgeon: Peter M Martinique, MD;  Location: [place] CV LAB;  Service: Cardiovascular;;   CARDIAC CATHETERIZATION  10/09/2016   CHOLECYSTECTOMY N/A 04/09/2018   Procedure: LAPAROSCOPIC CHOLECYSTECTOMY;  Surgeon: Greer Pickerel, MD;  [facility]: [facility] ORS;  Service: General;  Laterality: N/A;   COLONOSCOPY WITH PROPOFOL N/A 12/29/2017   Procedure: COLONOSCOPY WITH PROPOFOL;  Surgeon: [name], MD;  [facility]: [facility] END[facility];  Service: Endoscopy;  Laterality: N/A;   CORONARY ANGIOGRAM  2010   CORONARY ARTERY BYPASS GRAFT N/A 11/29/2014   Procedure: CORONARY ARTERY BYPASS GRAFTING x 5 (LIMA-LAD, SVG-D, SVG-OM1-OM2, SVG-PD);  Surgeon: Melrose Nakayama, MD;  Location: [place];  Service: Open Heart Surgery;  Laterality: N/A;   CORONARY STENT INTERVENTION N/A 10/31/2016   Procedure: CORONARY STENT INTERVENTION;  Surgeon: Burnell Blanks, MD;  Location: [place] CV LAB;  Service: Cardiovascular;  Laterality: N/A;   ESOPHAGOGASTRODUODENOSCOPY (EGD) WITH PROPOFOL N/A 12/29/2017   Procedure: ESOPHAGOGASTRODUODENOSCOPY (EGD) WITH PROPOFOL;  Surgeon: [name], MD;  [facility];  Service: Endoscopy;  Laterality: N/A;   FRACTURE SURGERY     INSERT / REPLACE / [place] ARTHROSCOPY Bilateral    LEFT HEART CATH AND CORS/GRAFTS ANGIOGRAPHY N/A 12/09/2017   Procedure: LEFT HEART CATH AND CORS/GRAFTS ANGIOGRAPHY;  Surgeon: Troy Sine, MD;  Location: [place] CV LAB;  Service: Cardiovascular;  Laterality: N/A;   LEFT HEART CATHETERIZATION WITH CORONARY ANGIOGRAM N/A 07/23/2012   Procedure: LEFT HEART CATHETERIZATION WITH CORONARY ANGIOGRAM;  Surgeon: Leonie Man, MD;  Location: [facility] CATH LAB;  Service: Cardiovascular;  Laterality:  N/A;   LEXISCAN MYOVIEW  11/14/2011   Mild-moderate defect seen in Mid Inferolateral and Mid Anterolateral regions-consistant w/ infarct/scar. No significant ischemia demonstrated.   POLYPECTOMY  12/29/2017   Procedure: POLYPECTOMY;  Surgeon: [name], MD;  Location: [facility];  Service: Endoscopy;;   PPM GENERATOR CHANGEOUT N/A 12/31/2020   Procedure: PPM GENERATOR CHANGEOUT;  Surgeon: Sanda Klein, MD;  Location: [place] CV LAB;  Service: Cardiovascular;  Laterality: N/A;   RIGHT/LEFT HEART CATH AND CORONARY ANGIOGRAPHY N/A 10/09/2016   Procedure: RIGHT/LEFT HEART CATH AND CORONARY ANGIOGRAPHY;  Surgeon: Jolaine Artist, MD;  Location: [place] CV LAB;  Service: Cardiovascular;  Laterality: N/A;   RIGHT/LEFT HEART CATH AND CORONARY/GRAFT ANGIOGRAPHY N/A 03/11/2019   Procedure: RIGHT/LEFT HEART CATH AND CORONARY/GRAFT ANGIOGRAPHY;  Surgeon: Jolaine Artist, MD;  Location: [place] CV LAB;  Service: Cardiovascular;  Laterality: N/A;   TEE WITHOUT CARDIOVERSION N/A 11/29/2014   Procedure: TRANSESOPHAGEAL ECHOCARDIOGRAM (TEE);  Surgeon: Melrose Nakayama, MD;  Location: [facility]  OR;  Service: Open Heart Surgery;  Laterality: N/A;   TRANSCAROTID ARTERY REVASCULARIZATION  Right 10/01/2020   Procedure: RIGHT TRANSCAROTID ARTERY REVASCULARIZATION;  Surgeon: [name], MD;  Location: [place];  Service: Vascular;  Laterality: Right;   TRANSTHORACIC ECHOCARDIOGRAM  07/23/2012   EF 55-60%, normal-mild   TUBAL LIGATION     ULTRASOUND GUIDANCE FOR VASCULAR ACCESS Left 10/01/2020   Procedure: ULTRASOUND GUIDANCE FOR VASCULAR ACCESS;  Surgeon: [name], MD;  Location: [place];  Service: Vascular;  Laterality: Left;   Social History   Socioeconomic History   Marital status: Widowed    Spouse name: Neima Lacross- deceased   Number of children: 5   Years of education: Not on file   Highest education level: Not on file  Occupational History    Occupation: STAFF/BUFFET    Employer: [employer]  Tobacco Use   Smoking status: Former    Packs/day: 0.25    Years: 3.00    Pack years: 0.75    Types: Cigarettes    Quit date: 02/11/1968    Years since quitting: 53.1   Smokeless tobacco: Never  Vaping Use   Vaping Use: Never used  Substance and Sexual Activity   Alcohol use: No   Drug use: No   Sexual activity: Never  Other Topics Concern   Not on file  Social History Narrative   Widowed last year. Spouse Mike Gip Sr (08) died on date 2012/01/26 05/12/2022) at his home Was a [place] [employer] -[place]   She lives with her two sons, Thayer Ohm    Other sons Haze Rushing   Social Determinants of Health   Financial Resource Strain: Not on [employer] Insecurity: No Food Insecurity   Worried About Charity fundraiser in the Last Year: Never true   Arboriculturist in the Last Year: Never true  Transportation Needs: No Transportation Needs   Lack of Transportation (Medical): No   Lack of Transportation (Non-Medical): No  Physical Activity: Not on file  Stress: Not on file  Social Connections: Moderately Integrated   Frequency of Communication with Friends and Family: Three times a week   Frequency of Social Gatherings with Friends and Family: Three times a week   Attends Religious Services: 1 to 4 times per year   Active Member of Clubs or Organizations: Yes   Attends Archivist Meetings: 1 to 4 times per year   Marital Status: Widowed   Family History  Problem Relation Age of Onset   Breast cancer Mother    Diabetes Mother    Heart disease Maternal Grandmother    Kidney disease Maternal Grandmother    Diabetes Maternal Grandmother    Glaucoma Maternal Aunt    Heart disease Maternal Aunt    Asthma Sister    Colon cancer Neg Hx    Stomach cancer Neg Hx    Pancreatic cancer Neg Hx    - negative except otherwise stated in the family history section Allergies  Allergen  Reactions   Ivp Dye [Iodinated Contrast Media] Shortness Of Breath    No reaction to PO contrast with non-ionic dye.06-25-2014/rsm   Shellfish Allergy Anaphylaxis   Sulfa Antibiotics Shortness Of Breath   Iodine Hives   Atorvastatin Other (See Comments)    Pt states "causes bilateral leg pain/cramps."   Benadryl [Diphenhydramine] Other (See Comments)    "THIS DRIVES ME CRAZY AND MAKES ME FEEL LIKE I  AM DYING" No problem last time they took   Colchicine Nausea And Vomiting   Contrast Media [Iodinated Contrast Media] Hives   Doxycycline Other (See Comments)    Unknown reaction, per patient   Uloric [Febuxostat] Other (See Comments)    Unknown reaction   Cephalexin Itching and Rash   Zithromax [Azithromycin] Rash   Prior to Admission medications   Medication Sig Start Date End Date Taking? Authorizing Provider  acetaminophen (TYLENOL) 500 MG tablet Take 1,000 mg by mouth 2 (two) times daily as needed (for pain or headaches).   Yes [provider]  albuterol (PROVENTIL HFA;VENTOLIN HFA) 108 (90 Base) MCG/ACT inhaler Inhale 2 puffs into the lungs every 6 (six) hours as needed for wheezing or shortness of breath. 05/02/18  Yes Fuller Plan A, MD  allopurinol (ZYLOPRIM) 100 MG tablet Take 1 tablet (100 mg total) by mouth daily. 03/14/21  Yes Croitoru, [name], MD  aspirin EC 81 MG tablet Take 1 tablet (81 mg total) by mouth daily. 03/27/16  Yes Kilroy, Luke K, PA-C  bisoprolol (ZEBETA) 5 MG tablet Take 1.5 tablets (7.5 mg total) by mouth daily. 03/18/21  Yes Milford, Maricela Bo, FNP  budesonide-formoterol (SYMBICORT) 160-4.5 MCG/ACT inhaler Inhale 2 puffs into the lungs 2 (two) times daily.   Yes [provider]  carboxymethylcellulose (REFRESH PLUS) 0.5 % SOLN Place 1 drop into both eyes every 12 (twelve) hours.   Yes [provider]  clopidogrel (PLAVIX) 75 MG tablet Take 1 tablet (75 mg total) by mouth daily. 02/28/21  Yes Croitoru, [name], MD  docusate sodium (COLACE) 100  MG capsule Take 100 mg by mouth at bedtime.   Yes [provider]  empagliflozin (JARDIANCE) 10 MG TABS tablet Take 10 mg by mouth daily before breakfast. 06/30/19  Yes Bensimhon, Shaune Pascal, MD  guaiFENesin (MUCINEX) 600 MG 12 hr tablet Take 1 tablet (600 mg total) by mouth 2 (two) times daily. Patient taking differently: Take 600 mg by mouth 2 (two) times daily as needed for cough. 01/10/21  Yes [name], NP  isosorbide mononitrate (IMDUR) 30 MG 24 hr tablet Take 90 mg by mouth daily.   Yes [provider]  loratadine (CLARITIN) 10 MG tablet Take 10 mg by mouth daily. 11/23/19  Yes [provider]  OXYGEN Inhale 3 L/min into the lungs continuous.    Yes [provider]  oxymetazoline (AFRIN) 0.05 % nasal spray Place 1 spray into both nostrils 2 (two) times daily as needed for congestion.   Yes [provider]  pantoprazole (PROTONIX) 40 MG tablet Take 1 tablet (40 mg total) by mouth 2 (two) times daily. 07/08/19  Yes Nandigam, Venia Minks, MD  polyethylene glycol (MIRALAX / GLYCOLAX) 17 g packet Take 17 g by mouth daily.   Yes [provider]  RESTASIS 0.05 % ophthalmic emulsion Place 1 drop into both eyes 2 (two) times daily. 03/19/21  Yes [provider]  rosuvastatin (CRESTOR) 40 MG tablet Take 1 tablet (40 mg total) by mouth every evening. NEED OV. 09/19/20  Yes [name], Laxman, MD  torsemide (DEMADEX) 20 MG tablet Take 60 mg by mouth 2 (two) times daily.   Yes [provider]  ACCU-CHEK AVIVA PLUS test strip Check blood sugar twice daily 10/19/19   [provider]  Accu-Chek Softclix Lancets lancets Check blood sugar twice daily 08/10/19   [provider]  albuterol (PROVENTIL) (2.5 MG/3ML) 0.083% nebulizer solution Take 3 mLs (2.5 mg total) by nebulization every 6 (  six) hours as needed for wheezing or shortness of breath. Patient not taking: Reported on 04/03/2021 05/02/18   Norval Morton, MD  Alcohol Swabs  (B-D SINGLE USE SWABS REGULAR) PADS  08/10/19   [provider]  EPINEPHrine 0.3 mg/0.3 mL IJ SOAJ injection Inject 0.3 mg into the muscle once as needed for anaphylaxis (severe allergic reaction). Patient not taking: Reported on 04/01/2021    [provider]  nitroGLYCERIN (NITROSTAT) 0.4 MG SL tablet Place 1 tablet (0.4 mg total) under the tongue every 5 (five) minutes as needed for chest pain. Patient not taking: Reported on 04/01/2021 11/13/16 03/18/22  Erlene Quan, PA-C  potassium chloride SA (KLOR-CON M) 20 MEQ tablet Take 1 tablet (20 mEq total) by mouth every morning. Patient not taking: Reported on 04/01/2021 03/23/21   Croitoru, Dani Gobble, MD  torsemide 40 MG TABS Take 80 mg by mouth 2 (two) times daily. Patient not taking: Reported on 04/03/2021 04/01/21 06/30/21  Sanda Klein, MD   DG Chest Portable 1 View  Result Date: 04/03/2021 CLINICAL DATA:  Fall, hip pain EXAM: PORTABLE CHEST 1 VIEW COMPARISON:  01/07/2021 FINDINGS: Right-sided implanted cardiac device. Stable cardiomegaly. Prior CABG. Aortic atherosclerosis. Mild pulmonary vascular congestion no focal airspace consolidation evident on portable supine view. No large pleural fluid collection. No evidence of a pneumothorax. IMPRESSION: Cardiomegaly with mild pulmonary vascular congestion. Electronically Signed   By: Davina Poke D.O.   On: 04/03/2021 10:11   DG Knee Left Port  Result Date: 04/03/2021 CLINICAL DATA:  Fall, pain EXAM: PORTABLE LEFT KNEE - 1-2 VIEW COMPARISON:  None. FINDINGS: There is no acute fracture or dislocation. Knee alignment is normal. There is mild medial and lateral compartment joint space narrowing with minimal osteophytosis. A surgical clip is noted in the posterior soft tissues. There is no effusion. IMPRESSION: No acute fracture or dislocation. Electronically Signed   By: Valetta Mole M.D.   On: 04/03/2021 12:02   DG Hip Unilat W or Wo Pelvis 2-3 Views Left  Result Date:  04/03/2021 CLINICAL DATA:  Fall today.  Left hip pain. EXAM: DG HIP (WITH OR WITHOUT PELVIS) 2-3V LEFT COMPARISON:  None. FINDINGS: A displaced intertrochanteric left hip fracture seen. No evidence of dislocation. Minimally displaced fracture is also seen involving the left superior pubic ramus. IMPRESSION: Displaced intertrochanteric left hip fracture. Left superior pubic ramus fracture. Electronically Signed   By: Marlaine Hind M.D.   On: 04/03/2021 10:34     Positive ROS: All other systems have been reviewed and were otherwise negative with the exception of those mentioned in the HPI and as above.  Physical Exam: General: No acute distress Cardiovascular: No pedal edema Respiratory: No cyanosis, no use of accessory musculature GI: No organomegaly, abdomen is soft and non-tender Skin: No lesions in the area of chief complaint Neurologic: Sensation intact distally Psychiatric: Patient is at baseline mood and affect Lymphatic: No axillary or cervical lymphadenopathy  MUSCULOSKELETAL:  Left hip is shortened externally rotated.  She is able to dorsiflex and plantarflex the left foot.  Sensation is intact in all distributions of the left foot.  2+ dorsalis pedis pulse  Independent Imaging Review: X-rays AP pelvis, left views completed hip and femur 2 views:  left displaced intertrochanteric hip fracture with shortening as well as a nondisplaced superior pubic ramus fracture  Assessment: 76 year old female follow-up with left displaced intertrochanteric hip fracture as well as superior pubic ramus fracture.  I described that ultimately I would like her to be weightbearing  as tolerated soon as possible.  With regard to this I do believe cephalomedullary nail fixation is indicated to improve her mobility status.  I described the alternatives of surgery including minimal to limited ambulation and ability to perform hygiene.  She would not like to proceed with cephalomedullary nailing of the left hip  after long discussion.  Plan: -N.p.o. and plan for left hip cephalomedullary nailing today  Thank you for the consult and the opportunity to see Ms. Deon Pilling, MD [facility] 8:01 AM

## 2021-04-04 NOTE — Discharge Instructions (Signed)
° ° °   Discharge Instructions    Attending Surgeon: Vanetta Mulders, MD Office Phone Number: [phone]   Diagnosis and Procedures:    Surgeries Performed: Left hip cephulomedullary nail  Discharge Plan:   Activity:  Weight bearing as tolerated

## 2021-04-04 NOTE — Transfer of Care (Signed)
Immediate Anesthesia Transfer of Care Note  Patient: [name]  Procedure(s) Performed: INTRAMEDULLARY (IM) NAIL INTERTROCHANTRIC (Left)  Patient Location: PACU  Anesthesia Type:General  Level of Consciousness: awake  Airway & Oxygen Therapy: Patient Spontanous Breathing and Patient connected to face mask oxygen  Post-op Assessment: Report given to RN and Post -op Vital signs reviewed and stable  Post vital signs: Reviewed and stable  Last Vitals:  Vitals Value Taken Time  BP 93/56 04/04/21 1601  Temp    Pulse 73 04/04/21 1602  Resp 21 04/04/21 1602  SpO2 100 % 04/04/21 1602  Vitals shown include unvalidated device data.  Last Pain:  Vitals:   04/04/21 1325  TempSrc: Oral  PainSc: 10-Worst pain ever      Patients Stated Pain Goal: 5 (80/32/12 2482)  Complications: No notable events documented.

## 2021-04-04 NOTE — Anesthesia Procedure Notes (Signed)
Procedure Name: Intubation Date/Time: 04/04/2021 1:45 PM Performed by: Maude Leriche, CRNA Pre-anesthesia Checklist: Patient identified, Emergency Drugs available, Suction available and Patient being monitored Patient Re-evaluated:Patient Re-evaluated prior to induction Oxygen Delivery Method: Circle system utilized Preoxygenation: Pre-oxygenation with 100% oxygen Induction Type: IV induction Ventilation: Mask ventilation without difficulty Laryngoscope Size: Miller and 2 Grade View: Grade I Tube type: Oral Tube size: 7.5 mm Number of attempts: 1 Airway Equipment and Method: Stylet and Oral airway Placement Confirmation: ETT inserted through vocal cords under direct vision, positive ETCO2 and breath sounds checked- equal and bilateral Secured at: 21 cm Tube secured with: Tape Dental Injury: Teeth and Oropharynx as per pre-operative assessment

## 2021-04-04 NOTE — Op Note (Addendum)
Date of Surgery: 04/04/2021  INDICATIONS: [name] is a 76 y.o.-year-old female with left hip subtrochanteric femur fracture.  The risk and benefits of the procedure with discussed in detail and documented in the pre-operative evaluation.  PREOPERATIVE DIAGNOSIS: 1.  Left hip intertrochanteric hip fracture with subtrochanteric extension 2.  Left nondisplaced superior pubic ramus fracture  POSTOPERATIVE DIAGNOSIS: Same.  PROCEDURE: 1.  Cephalomedullary nail left hip 2.  Closed management of pelvic ring injury superior pubic ramus fracture  SURGEON: Yevonne Pax MD  ASSISTANT: Raynelle Fanning, ATC; necessary for the timely completion of procedure and due to complexity of procedure.  ANESTHESIA:  general  IV FLUIDS AND URINE: See anesthesia record.  ANTIBIOTICS: Ancef 2 g  ESTIMATED BLOOD LOSS: 10 mL.  IMPLANTS:  Implant Name Type Inv. Item Serial No. Manufacturer Lot No. LRB No. Used Action  NAIL HIP AFFIXUS 11X360 125D - [id number] Nail NAIL HIP AFFIXUS 11X360 125D  [employer](ORTH,TRAU,BIO,SG) [id number] Left 1 Implanted  SCREW LAG 10.5MMX105MM HFN - [id number] Screw SCREW LAG 10.5MMX105MM HFN  [employer]IMMER [employer]ON(ORTH,TRAU,BIO,SG) [id number] Left 1 Implanted  CORTICAL BONE SCR 5.0MM X 46MM - [id number] Screw CORTICAL BONE SCR 5.0MM X 46MM  ZIMMER [employer]ON(ORTH,TRAU,BIO,SG) [id number] Left 1 Implanted  SCREW BONE CORTICAL 5.0X50 - [id number] Screw SCREW BONE CORTICAL 5.0X50  ZIMMER [employer]ON(ORTH,TRAU,BIO,SG) [id number] Left 1 Implanted    DRAINS: None  CULTURES: None  COMPLICATIONS: none  DESCRIPTION OF PROCEDURE:  The patient was identified in the preoperative holding area and the correct site was marked according universal protocol with nursing.  She is subsequently taken back to the operating room.  She had anesthesia induced.  Vancomycin was given 1 hour prior to skin incision.  She was placed on the Hana table.  The traction post was placed.  Traction and abduction was able to provide the  best reduction.  She was subsequently prepped and draped in the usual sterile fashion.  Final timeout was performed.  Began with a posterior approach to the greater trochanter.  This was done 3 cm proximal to the greater trochanter.  10 blade was used to incise through skin and IT band.  Blunt dissection was performed down to the level of the greater trochanter.  The pin was placed under direct fluoroscopic visualization at the center of the greater trochanter at the tip.  This was malleted into place down to the level of the lesser trochanter.  Opening reamer was then used.  A ball-tipped guidewire was then utilized.  This was passed across the fracture site.  A bone hook was also used on the proximal fragment in order to achieve reduction.  The ball wire was then measured and the appropriate size nail size 11 was taken.  Size 13 reamer was used.  Nail was introduced.  The cephalomedullary wire was placed.  Again measurement was taken after confirming center center on the AP lateral fluoroscopy.  The appropriate size screw was then placed into the head and neck component.  Given the fact there is minimal lateral wall the decision was made to lock the screw.  Distal interlocks were then placed.  This was done during perfect circle technique.  15 blade was used to incise through skin and IT band.  Drill was then used bicortically.  Screw sizes were measured and then 2 screws were placed both in static fashion.  The jig was removed.  The wounds were thoroughly irrigated.  Final fluoroscopy was confirmed good reduction on AP and laterals.  Vancomycin powder was placed  in the proximal incisions.  Wounds were closed in layers of 0 Vicryl 2-0 Vicryl and staples.  Complex dressings were placed.  Displaced her left nondisplaced to be reviewed ramus fracture was checked and a nondisplaced throughout the case.  She will still be allowed to be weightbearing as tolerated from this perspective     POSTOPERATIVE PLAN:  She will be weightbearing as tolerated on the left lower extremity.  She will be up and out of bed with physical therapy.  She may resume a normal diet.  I will see her back in 2 weeks for suture removal.  Yevonne Pax, MD 3:53 PM

## 2021-04-04 NOTE — Brief Op Note (Signed)
° °  Brief Op Note  Date of Surgery: 04/04/2021  Preoperative Diagnosis: left hip fracture  Postoperative Diagnosis: same  Procedure: Procedure(s): INTRAMEDULLARY (IM) NAIL INTERTROCHANTRIC  Implants: Implant Name Type Inv. Item Serial No. Manufacturer Lot No. LRB No. Used Action  NAIL HIP AFFIXUS 11X360 125D - [id number] Nail NAIL HIP AFFIXUS 11X360 125D  [employer]MER [employer]ON(ORTH,TRAU,BIO,SG) [id number] Left 1 Implanted  SCREW LAG 10.5MMX105MM HFN - [id number] Screw SCREW LAG 10.5MMX105MM HFN  Z[employer]MER [employer]ON(ORTH,TRAU,BIO,SG) [id number] Left 1 Implanted  CORTICAL BONE SCR 5.0MM X 46MM - [id number] Screw CORTICAL BONE SCR 5.0MM X 46MM  Z[employer]MER [employer]ON(ORTH,TRAU,BIO,SG) [id number] Left 1 Implanted  SCREW BONE CORTICAL 5.0X50 - [id number] Screw SCREW BONE CORTICAL 5.0X50  ZIMMER [employer]ON(ORTH,TRAU,BIO,SG) [id number] Left 1 Implanted    Surgeons: Surgeon(s): Vanetta Mulders, MD  Anesthesia: General    Estimated Blood Loss: See anesthesia record  Complications: None  Condition to PACU: Stable  Yevonne Pax, MD 04/04/2021 3:53 PM

## 2021-04-04 NOTE — Consult Note (Signed)
ORTHOPAEDIC CONSULTATION  REQUESTING PHYSICIAN: Terrilee Croak, MD  Chief Complaint: Left hip pain  HPI: [name] is a 76 y.o. female who presents with left hip pain and displaced intertrochanteric hip fracture after a fall while at home.  She states that she was at home walking when the leg gave out she immediately felt pain and inability to put weight on it.  She lives at home with her son although he is not around available as he works significantly.  She is otherwise a predominantly home occasionally community ambulator without assistive devices.  Past Medical History:  Diagnosis Date   ABDOMINAL PAIN -GENERALIZED 02/21/2010   Qualifier: Diagnosis of  By: Chester Holstein NP, Nevin Bloodgood     AICD (automatic cardioverter/defibrillator) present    downgraded to CRT-P in 2014   Anemia    Arthritis    Asthma    Atrial fibrillation ([place]) 10/24/2015   Questionable history of A Fib/A Flutter. On device interrogation on 9/7, showed 0.29min of A Fib/A Flutter with 1% burden.  Device check in Jan 2018 showed no sustained AF (runs of less than 30 seconds). No anticoagulation indicated.   CAD (coronary artery disease)    a. 10/09/16 LHC: SVG->LAD patent, SVG->Diag patent, SVG->RCA patent, SVG->LCx occluded. EF 60%, b. 10/31/16 LHC DES to AV groove Circ, DES to intermed branch   Cellulitis and abscess of foot 12/2014   RT FOOT   CHF (congestive heart failure) (HCC)    Chronic bronchitis (HCC)    Chronic lower back pain    Complete heart block ([place]) 06/22/2015   Diverticulosis    Facial numbness 02/2016   Fatty liver disease, nonalcoholic    Gastritis    GERD (gastroesophageal reflux disease)    Gout    H/O hiatal hernia    HEMORRHOIDS-EXTERNAL 02/21/2010   Qualifier: Diagnosis of  By: Chester Holstein NP, Paula     High cholesterol    Hyperplastic colon polyp    Hypertension    IBS (irritable bowel syndrome)    Internal hemorrhoids    INTERNAL HEMORRHOIDS WITHOUT MENTION COMP 04/12/2007    Qualifier: Diagnosis of  By: Olevia Perches MD, Dora M    Nonischemic cardiomyopathy ([place]) 11/22/2011   Pt responded to BiV ICD- last EF 55-60% Sept 2017   Obesity    On home oxygen therapy    "2L at night" (10/31/2016)   OSA (obstructive sleep apnea)    "can't tolerate a mask" (10/30/2016)   PERSONAL HX COLONIC POLYPS 02/21/2010   Qualifier: Diagnosis of  By: Chester Holstein NP, Paula     Pneumonia    "couple times" (10/31/2016)   Presence of permanent cardiac pacemaker    11/02/12 [employer] V273 INTUA PPM   Right facial numbness 03/05/2016   Shortness of breath    Stroke ([place]) 09/2020   TIA (transient ischemic attack)    "recently" (10/31/2016)   Type II diabetes mellitus ([place])    Past Surgical History:  Procedure Laterality Date   ABDOMINAL ULTRASOUND  12/01/2011   Peripelvic cysts- #1- 2.4x1.9x2.3cm, #2-1.2x0.9x1.2cm   ANKLE FRACTURE SURGERY Right    "had rod put in"   ANTERIOR CERVICAL DECOMP/DISCECTOMY FUSION     APPENDECTOMY     BACK SURGERY     BIOPSY  12/29/2017   Procedure: BIOPSY;  Surgeon: [name], MD;  Location: [facility];  Service: Endoscopy;;   BIV ICD INSERTION CRT-D  2001?   BIV PACEMAKER GENERATOR CHANGE OUT N/A 11/02/2012   Procedure: BIV PACEMAKER GENERATOR CHANGE  OUT;  Surgeon: Sanda Klein, MD;  Location: [facility] CATH LAB;  Service: Cardiovascular;  Laterality: N/A;   CARDIAC CATHETERIZATION  05/17/1999   No significant coronary obstructive disease w/ mild 20% luminal irregularity of the first diag branch of the LAD   CARDIAC CATHETERIZATION  07/08/2002   No significant CAD, moderately depressed LV systolic function   CARDIAC CATHETERIZATION Bilateral 04/26/2007   Normal findings, recommend medical therapy   CARDIAC CATHETERIZATION  02/18/2008   Moderate CAD, would benefit from having a functional study, recommend continue medical therapy   CARDIAC CATHETERIZATION  07/23/2012   Medical therapy   CARDIAC CATHETERIZATION N/A 11/24/2014   Procedure: Left Heart  Cath and Coronary Angiography;  Surgeon: Troy Sine, MD;  Location: [place] CV LAB;  Service: Cardiovascular;  Laterality: N/A;   CARDIAC CATHETERIZATION  11/27/2014   Procedure: Intravascular Pressure Wire/FFR Study;  Surgeon: Peter M Martinique, MD;  Location: [place] CV LAB;  Service: Cardiovascular;;   CARDIAC CATHETERIZATION  10/09/2016   CHOLECYSTECTOMY N/A 04/09/2018   Procedure: LAPAROSCOPIC CHOLECYSTECTOMY;  Surgeon: Greer Pickerel, MD;  Location: [facility];  Service: General;  Laterality: N/A;   COLONOSCOPY WITH PROPOFOL N/A 12/29/2017   Procedure: COLONOSCOPY WITH PROPOFOL;  Surgeon: [name], MD;  Location: [facility];  Service: Endoscopy;  Laterality: N/A;   CORONARY ANGIOGRAM  2010   CORONARY ARTERY BYPASS GRAFT N/A 11/29/2014   Procedure: CORONARY ARTERY BYPASS GRAFTING x 5 (LIMA-LAD, SVG-D, SVG-OM1-OM2, SVG-PD);  Surgeon: Melrose Nakayama, MD;  Location: [place];  Service: Open Heart Surgery;  Laterality: N/A;   CORONARY STENT INTERVENTION N/A 10/31/2016   Procedure: CORONARY STENT INTERVENTION;  Surgeon: Burnell Blanks, MD;  Location: [place] CV LAB;  Service: Cardiovascular;  Laterality: N/A;   ESOPHAGOGASTRODUODENOSCOPY (EGD) WITH PROPOFOL N/A 12/29/2017   Procedure: ESOPHAGOGASTRODUODENOSCOPY (EGD) WITH PROPOFOL;  Surgeon: [name], MD;  Location: [facility];  Service: Endoscopy;  Laterality: N/A;   FRACTURE SURGERY     INSERT / REPLACE / [place] ARTHROSCOPY Bilateral    LEFT HEART CATH AND CORS/GRAFTS ANGIOGRAPHY N/A 12/09/2017   Procedure: LEFT HEART CATH AND CORS/GRAFTS ANGIOGRAPHY;  Surgeon: Troy Sine, MD;  Location: [place] CV LAB;  Service: Cardiovascular;  Laterality: N/A;   LEFT HEART CATHETERIZATION WITH CORONARY ANGIOGRAM N/A 07/23/2012   Procedure: LEFT HEART CATHETERIZATION WITH CORONARY ANGIOGRAM;  Surgeon: Leonie Man, MD;  Location: [facility] CATH LAB;  Service: Cardiovascular;  Laterality:  N/A;   LEXISCAN MYOVIEW  11/14/2011   Mild-moderate defect seen in Mid Inferolateral and Mid Anterolateral regions-consistant w/ infarct/scar. No significant ischemia demonstrated.   POLYPECTOMY  12/29/2017   Procedure: POLYPECTOMY;  Surgeon: [name], MD;  Location: [facility];  Service: Endoscopy;;   PPM GENERATOR CHANGEOUT N/A 12/31/2020   Procedure: PPM GENERATOR CHANGEOUT;  Surgeon: Sanda Klein, MD;  Location: [place] CV LAB;  Service: Cardiovascular;  Laterality: N/A;   RIGHT/LEFT HEART CATH AND CORONARY ANGIOGRAPHY N/A 10/09/2016   Procedure: RIGHT/LEFT HEART CATH AND CORONARY ANGIOGRAPHY;  Surgeon: Jolaine Artist, MD;  Location: [place] CV LAB;  Service: Cardiovascular;  Laterality: N/A;   RIGHT/LEFT HEART CATH AND CORONARY/GRAFT ANGIOGRAPHY N/A 03/11/2019   Procedure: RIGHT/LEFT HEART CATH AND CORONARY/GRAFT ANGIOGRAPHY;  Surgeon: Jolaine Artist, MD;  Location: [place] CV LAB;  Service: Cardiovascular;  Laterality: N/A;   TEE WITHOUT CARDIOVERSION N/A 11/29/2014   Procedure: TRANSESOPHAGEAL ECHOCARDIOGRAM (TEE);  Surgeon: Melrose Nakayama, MD;  [facility]  OR;  Service: Open Heart Surgery;  Laterality: N/A;   TRANSCAROTID ARTERY REVASCULARIZATION  Right 10/01/2020   Procedure: RIGHT TRANSCAROTID ARTERY REVASCULARIZATION;  Surgeon: [name], MD;  Location: [place];  Service: Vascular;  Laterality: Right;   TRANSTHORACIC ECHOCARDIOGRAM  07/23/2012   EF 55-60%, normal-mild   TUBAL LIGATION     ULTRASOUND GUIDANCE FOR VASCULAR ACCESS Left 10/01/2020   Procedure: ULTRASOUND GUIDANCE FOR VASCULAR ACCESS;  Surgeon: [name], MD;  Location: [place];  Service: Vascular;  Laterality: Left;   Social History   Socioeconomic History   Marital status: Widowed    Spouse name: Doniesha Landau- deceased   Number of children: 5   Years of education: Not on file   Highest education level: Not on file  Occupational History    Occupation: STAFF/BUFFET    Employer: [employer]  Tobacco Use   Smoking status: Former    Packs/day: 0.25    Years: 3.00    Pack years: 0.75    Types: Cigarettes    Quit date: 02/11/1968    Years since quitting: 53.1   Smokeless tobacco: Never  Vaping Use   Vaping Use: Never used  Substance and Sexual Activity   Alcohol use: No   Drug use: No   Sexual activity: Never  Other Topics Concern   Not on file  Social History Narrative   Widowed last year. Spouse Mike Gip Sr (60) died on date January 29, 2012 05-15-22) at his home Was a [place] [employer] -[place] [employer]   She lives with her two sons, Thayer Ohm    Other sons Haze Rushing   Social Determinants of Health   Financial Resource Strain: Not on [employer] Insecurity: No Food Insecurity   Worried About Charity fundraiser in the Last Year: Never true   Arboriculturist in the Last Year: Never true  Transportation Needs: No Transportation Needs   Lack of Transportation (Medical): No   Lack of Transportation (Non-Medical): No  Physical Activity: Not on file  Stress: Not on file  Social Connections: Moderately Integrated   Frequency of Communication with Friends and Family: Three times a week   Frequency of Social Gatherings with Friends and Family: Three times a week   Attends Religious Services: 1 to 4 times per year   Active Member of Clubs or Organizations: Yes   Attends Archivist Meetings: 1 to 4 times per year   Marital Status: Widowed   Family History  Problem Relation Age of Onset   Breast cancer Mother    Diabetes Mother    Heart disease Maternal Grandmother    Kidney disease Maternal Grandmother    Diabetes Maternal Grandmother    Glaucoma Maternal Aunt    Heart disease Maternal Aunt    Asthma Sister    Colon cancer Neg Hx    Stomach cancer Neg Hx    Pancreatic cancer Neg Hx    - negative except otherwise stated in the family history section Allergies  Allergen  Reactions   Ivp Dye [Iodinated Contrast Media] Shortness Of Breath    No reaction to PO contrast with non-ionic dye.06-25-2014/rsm   Shellfish Allergy Anaphylaxis   Sulfa Antibiotics Shortness Of Breath   Iodine Hives   Atorvastatin Other (See Comments)    Pt states "causes bilateral leg pain/cramps."   Benadryl [Diphenhydramine] Other (See Comments)    "THIS DRIVES ME CRAZY AND MAKES ME FEEL LIKE I  AM DYING" No problem last time they took   Colchicine Nausea And Vomiting   Contrast Media [Iodinated Contrast Media] Hives   Doxycycline Other (See Comments)    Unknown reaction, per patient   Uloric [Febuxostat] Other (See Comments)    Unknown reaction   Cephalexin Itching and Rash   Zithromax [Azithromycin] Rash   Prior to Admission medications   Medication Sig Start Date End Date Taking? Authorizing Provider  acetaminophen (TYLENOL) 500 MG tablet Take 1,000 mg by mouth 2 (two) times daily as needed (for pain or headaches).   Yes [provider]  albuterol (PROVENTIL HFA;VENTOLIN HFA) 108 (90 Base) MCG/ACT inhaler Inhale 2 puffs into the lungs every 6 (six) hours as needed for wheezing or shortness of breath. 05/02/18  Yes Fuller Plan A, MD  allopurinol (ZYLOPRIM) 100 MG tablet Take 1 tablet (100 mg total) by mouth daily. 03/14/21  Yes Croitoru, [name], MD  aspirin EC 81 MG tablet Take 1 tablet (81 mg total) by mouth daily. 03/27/16  Yes Kilroy, Luke K, PA-C  bisoprolol (ZEBETA) 5 MG tablet Take 1.5 tablets (7.5 mg total) by mouth daily. 03/18/21  Yes Milford, Maricela Bo, FNP  budesonide-formoterol (SYMBICORT) 160-4.5 MCG/ACT inhaler Inhale 2 puffs into the lungs 2 (two) times daily.   Yes [provider]  carboxymethylcellulose (REFRESH PLUS) 0.5 % SOLN Place 1 drop into both eyes every 12 (twelve) hours.   Yes [provider]  clopidogrel (PLAVIX) 75 MG tablet Take 1 tablet (75 mg total) by mouth daily. 02/28/21  Yes Croitoru, [name], MD  docusate sodium (COLACE) 100  MG capsule Take 100 mg by mouth at bedtime.   Yes [provider]  empagliflozin (JARDIANCE) 10 MG TABS tablet Take 10 mg by mouth daily before breakfast. 06/30/19  Yes Bensimhon, Shaune Pascal, MD  guaiFENesin (MUCINEX) 600 MG 12 hr tablet Take 1 tablet (600 mg total) by mouth 2 (two) times daily. Patient taking differently: Take 600 mg by mouth 2 (two) times daily as needed for cough. 01/10/21  Yes [name], NP  isosorbide mononitrate (IMDUR) 30 MG 24 hr tablet Take 90 mg by mouth daily.   Yes [provider]  loratadine (CLARITIN) 10 MG tablet Take 10 mg by mouth daily. 11/23/19  Yes [provider]  OXYGEN Inhale 3 L/min into the lungs continuous.    Yes [provider]  oxymetazoline (AFRIN) 0.05 % nasal spray Place 1 spray into both nostrils 2 (two) times daily as needed for congestion.   Yes [provider]  pantoprazole (PROTONIX) 40 MG tablet Take 1 tablet (40 mg total) by mouth 2 (two) times daily. 07/08/19  Yes Nandigam, Venia Minks, MD  polyethylene glycol (MIRALAX / GLYCOLAX) 17 g packet Take 17 g by mouth daily.   Yes [provider]  RESTASIS 0.05 % ophthalmic emulsion Place 1 drop into both eyes 2 (two) times daily. 03/19/21  Yes [provider]  rosuvastatin (CRESTOR) 40 MG tablet Take 1 tablet (40 mg total) by mouth every evening. NEED OV. 09/19/20  Yes [name], Laxman, MD  torsemide (DEMADEX) 20 MG tablet Take 60 mg by mouth 2 (two) times daily.   Yes [provider]  ACCU-CHEK AVIVA PLUS test strip Check blood sugar twice daily 10/19/19   [provider]  Accu-Chek Softclix Lancets lancets Check blood sugar twice daily 08/10/19   [provider]  albuterol (PROVENTIL) (2.5 MG/3ML) 0.083% nebulizer solution Take 3 mLs (2.5 mg total) by nebulization every 6 (  six) hours as needed for wheezing or shortness of breath. Patient not taking: Reported on 04/03/2021 05/02/18   Norval Morton, MD  Alcohol Swabs  (B-D SINGLE USE SWABS REGULAR) PADS  08/10/19   [provider]  EPINEPHrine 0.3 mg/0.3 mL IJ SOAJ injection Inject 0.3 mg into the muscle once as needed for anaphylaxis (severe allergic reaction). Patient not taking: Reported on 04/01/2021    [provider]  nitroGLYCERIN (NITROSTAT) 0.4 MG SL tablet Place 1 tablet (0.4 mg total) under the tongue every 5 (five) minutes as needed for chest pain. Patient not taking: Reported on 04/01/2021 11/13/16 03/18/22  Erlene Quan, PA-C  potassium chloride SA (KLOR-CON M) 20 MEQ tablet Take 1 tablet (20 mEq total) by mouth every morning. Patient not taking: Reported on 04/01/2021 03/23/21   Croitoru, Dani Gobble, MD  torsemide 40 MG TABS Take 80 mg by mouth 2 (two) times daily. Patient not taking: Reported on 04/03/2021 04/01/21 06/30/21  Sanda Klein, MD   DG Chest Portable 1 View  Result Date: 04/03/2021 CLINICAL DATA:  Fall, hip pain EXAM: PORTABLE CHEST 1 VIEW COMPARISON:  01/07/2021 FINDINGS: Right-sided implanted cardiac device. Stable cardiomegaly. Prior CABG. Aortic atherosclerosis. Mild pulmonary vascular congestion no focal airspace consolidation evident on portable supine view. No large pleural fluid collection. No evidence of a pneumothorax. IMPRESSION: Cardiomegaly with mild pulmonary vascular congestion. Electronically Signed   By: Davina Poke D.O.   On: 04/03/2021 10:11   DG Knee Left Port  Result Date: 04/03/2021 CLINICAL DATA:  Fall, pain EXAM: PORTABLE LEFT KNEE - 1-2 VIEW COMPARISON:  None. FINDINGS: There is no acute fracture or dislocation. Knee alignment is normal. There is mild medial and lateral compartment joint space narrowing with minimal osteophytosis. A surgical clip is noted in the posterior soft tissues. There is no effusion. IMPRESSION: No acute fracture or dislocation. Electronically Signed   By: Valetta Mole M.D.   On: 04/03/2021 12:02   DG Hip Unilat W or Wo Pelvis 2-3 Views Left  Result Date:  04/03/2021 CLINICAL DATA:  Fall today.  Left hip pain. EXAM: DG HIP (WITH OR WITHOUT PELVIS) 2-3V LEFT COMPARISON:  None. FINDINGS: A displaced intertrochanteric left hip fracture seen. No evidence of dislocation. Minimally displaced fracture is also seen involving the left superior pubic ramus. IMPRESSION: Displaced intertrochanteric left hip fracture. Left superior pubic ramus fracture. Electronically Signed   By: Marlaine Hind M.D.   On: 04/03/2021 10:34     Positive ROS: All other systems have been reviewed and were otherwise negative with the exception of those mentioned in the HPI and as above.  Physical Exam: General: No acute distress Cardiovascular: No pedal edema Respiratory: No cyanosis, no use of accessory musculature GI: No organomegaly, abdomen is soft and non-tender Skin: No lesions in the area of chief complaint Neurologic: Sensation intact distally Psychiatric: Patient is at baseline mood and affect Lymphatic: No axillary or cervical lymphadenopathy  MUSCULOSKELETAL:  Left hip is shortened externally rotated.  She is able to dorsiflex and plantarflex the left foot.  Sensation is intact in all distributions of the left foot.  2+ dorsalis pedis pulse  Independent Imaging Review: X-rays AP pelvis, left views completed hip and femur 2 views:  left displaced intertrochanteric hip fracture with shortening as well as a nondisplaced superior pubic ramus fracture  Assessment: 76 year old female follow-up with left displaced intertrochanteric hip fracture as well as superior pubic ramus fracture.  I described that ultimately I would like her to be weightbearing  as tolerated soon as possible.  With regard to this I do believe cephalomedullary nail fixation is indicated to improve her mobility status.  I described the alternatives of surgery including minimal to limited ambulation and ability to perform hygiene.  She would not like to proceed with cephalomedullary nailing of the left hip  after long discussion.  Plan: -N.p.o. and plan for left hip cephalomedullary nailing today  Thank you for the consult and the opportunity to see Ms. Deon Pilling, MD [facility] 8:01 AM

## 2021-04-04 NOTE — Plan of Care (Signed)

## 2021-04-04 NOTE — Interval H&P Note (Signed)
History and Physical Interval Note:  04/04/2021 1:03 PM  [name]  has presented today for surgery, with the diagnosis of left hip fracture.  The various methods of treatment have been discussed with the patient and family. After consideration of risks, benefits and other options for treatment, the patient has consented to  Procedure(s): INTRAMEDULLARY (IM) NAIL INTERTROCHANTRIC (Left) as a surgical intervention.  The patient's history has been reviewed, patient examined, no change in status, stable for surgery.  I have reviewed the patient's chart and labs.  Questions were answered to the patient's satisfaction.     Vanetta Mulders

## 2021-04-04 NOTE — Progress Notes (Signed)
PROGRESS NOTE  [name]  DOB: July 30, 1945  PCP: Lujean Amel, MD ZHG:[id number]  DOA: 04/03/2021  LOS: 1 day  Hospital Day: 2  Brief narrative: [name] is a 76 y.o. female with PMH significant for DM2, HTN, morbid obesity, OSA, OHS chronically on 2 L oxygen at home, CAD s/p CABG '16 (LIMA to LAD, SVG to D1, SVG to OM1 and OM2, SVG to PDA), HFpEF, complete heart block s/p PPM/ICD, CKD stage III, adrenal insufficiency. Patient presented to the ED on 04/03/2021 after a fall at home.  She lives at home with her son, ambulates inside the home without a walker but uses walker to walk.   On 2/22, patient was up and felt her left leg suddenly gave out and she fell on the floor landing on her left side and started immediately hurting in the left hip and leg.  Denies any premonition symptoms like chest pain, shortness of breath, palpitation, dizziness, nausea, vomiting.  Did not pass out or hit her head. EMS brought her to the ED.  In the ED, patient was hemodynamically stable. Labs with hemoglobin of 10.8, serum bicarb 33, BUN/creatinine 18/1.69, BNP 334. Chest x-ray with cardiomegaly and mild pulmonary vascular congestion. X-ray noted a displaced intertrochanteric left hip fracture with left superior pubic ramus fracture Admitted to hospitalist service Orthopedics consulted.  Subjective: Patient was seen and examined this morning.  Pleasant elderly Caucasian female.  Propped up in bed.  On 3 L oxygen which is her baseline requirement.  Pain controlled at rest. We discussed about her comorbidities and risk and benefit of fracture repair surgery. Patient denies any acute cardiac symptoms at this time.  Her fall was mechanical and did not have any premonition symptoms.  She had pacemaker generator changed.  Last seen by cardiologist Dr. [name] 2 days ago and.  Torsemide dose was increased from 60 mg daily to 80 mg daily.  Principal Problem:   Closed left hip fracture ([place])  secondary to fall at home Active Problems:   ANEMIA   HTN (hypertension), benign   Mixed hypercholesterolemia and hypertriglyceridemia   S/P CABG x 5   Chronic diastolic CHF (congestive heart failure) (HCC)   CHB (complete heart block) (HCC)   Diabetes mellitus without complication (HCC)   Chronic respiratory failure (HCC)   Thrombocytopenia (HCC)   Closed fracture of left superior pubic ramus (HCC)   Chronic kidney disease, stage III B (moderate) ([place])   Fall at home, initial encounter   Hypokalemia    Assessment and Plan: Closed left hip fracture secondary to fall at home Left superior pubic ramus fracture -Patient had a mechanical fall at home impacting the left hip sustaining closed left hip fracture as well as the left superior pubic ramus fracture.   -Orthopedic consulted.   -Planned for ORIF today for hip fracture -No surgical intervention recommended for pubic ramus fracture. -Pain control and DVT prophylaxis per orthopedics  Perioperative cardiac risk assessment -Patient has significant history of CAD/stent, diastolic CHF, and is chronically on 3 L oxygen.  She has no new cardiac symptoms.  Remains on baseline oxygen requirement.  Her fall was mechanical and did not have any premonition cardiac symptoms.  No preoperative cardiac testing is required at this time. -We discussed about her comorbidities and the risk and benefit of fracture repair surgery.  Patient understands that without the fracture repair, she essentially will be bed bound for the rest of her life.  Prior to fall, she was able to  ambulate with a walker. -Patient understands risk and benefit and would like to proceed for surgery.  Type 2 diabetes mellitus -A1c 6.5 in November 2022 -Home meds include Jardiance 10 mg daily -Currently on hold.  Continue sliding scale insulin. -Blood sugar level remain controlled Recent Labs  Lab 04/04/21 0801 04/04/21 0953  GLUCAP 134* 115*   Diabetes mellitus without  complication   Last hemoglobin A1c was 6.5 less than 3 months ago.  Patient is on Jardiance 10 mg daily. -Hypoglycemic protocols -Held Jardiance -CBGs before every meal with sensitive SSI -Adjust insulin regimen as needed   CAD S/P CABG x 5 HLD -Last cardiac catheter was in 02/2019 where all territories were noted to have adequate coronary flow via bypasses or native vessels with stents. -Continue aspirin, Plavix, beta-blocker, isosorbide mononitrate and Crestor   CHB (complete heart block)    S/p PPM/ICD    Chronic diastolic CHF Hypertension -Last echocardiogram noted EF of 55 to 60% with grade 1 diastolic dysfunction in 03/6710.   -Looks euvolemic at this time. -Home meds include beta-blocker, Imdur, torsemide -Continue bisoprolol, Imdur.  Keep torsemide preoperatively.   CKD 3B -Baseline creatinine less than 1.7.  Currently remains close to range.   -Continue to monitor creatinine. Recent Labs    12/31/20 1230 01/07/21 0419 01/07/21 1636 01/08/21 0647 01/09/21 0340 01/10/21 0039 01/21/21 1221 02/14/21 0112 03/18/21 1127 04/03/21 1000 04/04/21 0226  BUN 19 18  --  16 20 26* 30* 24* 22 18 18   CREATININE 1.26* 1.51* 1.35* 1.36* 1.52* 1.76* 1.84* 1.58* 1.55* 1.69* 1.75*   Chronic mild anemia -Baseline hemoglobin more than 10.  Hemoglobin stable at this time.  Continue to monitor. Recent Labs    01/07/21 0419 01/07/21 1636 02/14/21 0112 04/03/21 1000 04/04/21 0226  HGB 10.8* 10.8* 11.9* 10.8* 10.0*  MCV 98.6 99.5 99.0 99.2 98.8   Morbid obesity  -Body mass index is 41.24 kg/m. Patient has been advised to make an attempt to improve diet and exercise patterns to aid in weight loss.  Chronic respiratory failure  OSA/OHS -Continue nasal cannula oxygen 3 L  Mobility: Needs PT eval postprocedure Goals of care   Code Status: Full Code    Nutritional status:  Body mass index is 41.24 kg/m.      Diet:  Diet Order             Diet NPO time specified  Diet  effective now                   DVT prophylaxis:  heparin injection 5,000 Units Start: 04/03/21 1430   Antimicrobials: None Fluid: None Consultants: Orthopedics Family Communication: None at bedside  Status is: Inpatient  Continue in-hospital care because: Pending ORIF Level of care: Telemetry Surgical   Dispo: The patient is from: Home              Anticipated d/c is to: PT eval postprocedure              Patient currently is not medically stable to d/c.   Difficult to place patient No     Infusions:    ceFAZolin (ANCEF) IV     tranexamic acid     vancomycin      Scheduled Meds:  allopurinol  100 mg Oral Daily   aspirin EC  81 mg Oral Daily   bisoprolol  7.5 mg Oral Daily   chlorhexidine  60 mL Topical Once   clopidogrel  75 mg Oral Daily  cycloSPORINE  1 drop Both Eyes BID   heparin  5,000 Units Subcutaneous Q8H   insulin aspart  0-9 Units Subcutaneous TID WC   isosorbide mononitrate  90 mg Oral Daily   loratadine  10 mg Oral Daily   mometasone-formoterol  2 puff Inhalation BID   pantoprazole  40 mg Oral BID   polyethylene glycol  17 g Oral Daily   polyvinyl alcohol  1 drop Both Eyes Q12H   potassium chloride SA  20 mEq Oral q morning   rosuvastatin  40 mg Oral QPM   senna  1 tablet Oral BID    PRN meds: acetaminophen, guaiFENesin, HYDROcodone-acetaminophen, morphine injection   Antimicrobials: Anti-infectives (From admission, onward)    Start     Dose/Rate Route Frequency Ordered Stop   04/04/21 1200  vancomycin (VANCOREADY) IVPB 1500 mg/300 mL        1,500 mg 150 mL/hr over 120 Minutes Intravenous On call to O.R. 04/03/21 1241 04/05/21 0559   04/04/21 1145  ceFAZolin (ANCEF) IVPB 2g/100 mL premix        2 g 200 mL/hr over 30 Minutes Intravenous On call to O.R. 04/04/21 0942 04/05/21 0559       Objective: Vitals:   04/04/21 0739 04/04/21 0750  BP:    Pulse:    Resp:    Temp:  98 F (36.7 C)  SpO2: 97%     Intake/Output Summary  (Last 24 hours) at 04/04/2021 1052 Last data filed at 04/04/2021 0821 Gross per 24 hour  Intake 740 ml  Output 900 ml  Net -160 ml   Filed Weights   04/03/21 0917 04/03/21 1709  Weight: 103 kg 105.6 kg   Weight change:  Body mass index is 41.24 kg/m.   Physical Exam: General exam: Pleasant, elderly morbidly obese Caucasian female Skin: No rashes, lesions or ulcers. HEENT: Atraumatic, normocephalic, no obvious bleeding Lungs: Diminished air entry in both bases CVS: Regular rate and rhythm, no murmur GI/Abd soft, nontender, distended from obesity, bowel sound present CNS: Alert, awake, oriented x3 Psychiatry: Mood appropriate Extremities: No pedal edema, no calf tenderness  Data Review: I have personally reviewed the laboratory data and studies available.  F/u labs ordered Unresulted Labs (From admission, onward)     Start     Ordered   04/03/21 2133  Hemoglobin A1c  Once,   R       Comments: To assess prior glycemic control   Question:  Specimen collection method  Answer:  Lab=Lab collect   04/03/21 2132   Unscheduled  CBC with Differential/Platelet  Daily,   R     Question:  Specimen collection method  Answer:  Lab=Lab collect   04/04/21 1052   Unscheduled  Basic metabolic panel  Daily,   R     Question:  Specimen collection method  Answer:  Lab=Lab collect   04/04/21 1052            Signed, Terrilee Croak, MD [facility] 04/04/2021

## 2021-04-04 NOTE — Anesthesia Postprocedure Evaluation (Signed)
Anesthesia Post Note  Patient: [name]  Procedure(s) Performed: INTRAMEDULLARY (IM) NAIL INTERTROCHANTRIC (Left)     Patient location during evaluation: PACU Anesthesia Type: General Level of consciousness: awake and alert Pain management: pain level controlled Vital Signs Assessment: post-procedure vital signs reviewed and stable Respiratory status: spontaneous breathing, nonlabored ventilation, respiratory function stable and patient connected to nasal cannula oxygen Cardiovascular status: blood pressure returned to baseline and stable Postop Assessment: no apparent nausea or vomiting Anesthetic complications: no   No notable events documented.  Last Vitals:  Vitals:   04/04/21 1728 04/04/21 1742  BP:  109/71  Pulse:  71  Resp:  (!) 9  Temp:  (!) 36.3 C  SpO2: 95% 96%    Last Pain:  Vitals:   04/04/21 1742  TempSrc: Oral  PainSc:                  Effie Berkshire

## 2021-04-04 NOTE — Anesthesia Preprocedure Evaluation (Signed)
Anesthesia Evaluation  Patient identified by MRN, date of birth, ID band Patient awake    Reviewed: Allergy & Precautions, NPO status , Patient's Chart, lab work & pertinent test results, reviewed documented beta blocker date and time   Airway Mallampati: IV  TM Distance: >3 FB Neck ROM: Full    Dental  (+) Upper Dentures, Edentulous Lower   Pulmonary shortness of breath and with exertion, asthma , sleep apnea and Continuous Positive Airway Pressure Ventilation , pneumonia, resolved, former smoker,    breath sounds clear to auscultation + decreased breath sounds      Cardiovascular hypertension, Pt. on medications + angina with exertion + CAD, + Cardiac Stents, + CABG and +CHF  Normal cardiovascular exam+ dysrhythmias Atrial Fibrillation + pacemaker + Cardiac Defibrillator  Rhythm:Regular Rate:Normal  Echo 09/17/20 1. Left ventricular ejection fraction, by estimation, is 55 to 60%. The left ventricle has normal function. The left ventricle has no regional wall motion abnormalities. Left ventricular diastolic parameters are consistent with Grade I diastolic dysfunction (impaired relaxation). Elevated left atrial pressure.  2. Right ventricular systolic function is normal. The right ventricular size is normal.  3. Left atrial size was moderately dilated.  4. The mitral valve is normal in structure. Trivial mitral valve regurgitation. No evidence of mitral stenosis. Moderate mitral annular calcification.  5. The aortic valve has an indeterminant number of cusps. Aortic valve regurgitation is trivial. Mild aortic valve stenosis.  6. The inferior vena cava is normal in size with greater than 50% respiratory variability, suggesting right atrial pressure of 3 mmHg.   EKG 03/18/21 Biventricular paced rhythm.  Stents Right Carotid, Cardiac x 2  CABG x 5 3 years ago   Neuro/Psych TIACVA negative psych ROS   GI/Hepatic Neg liver ROS,  hiatal hernia, GERD  Medicated and Controlled,  Endo/Other  diabetes, Poorly Controlled, Type 2, Oral Hypoglycemic AgentsMorbid obesity  Renal/GU Renal InsufficiencyRenal disease  negative genitourinary   Musculoskeletal  (+) Arthritis , Osteoarthritis,  Intertrochanteric Fx Left hip   Abdominal   Peds  Hematology  (+) Blood dyscrasia, anemia ,   Anesthesia Other Findings   Reproductive/Obstetrics                             Anesthesia Physical  Anesthesia Plan  ASA: 3  Anesthesia Plan: General   Post-op Pain Management:    Induction: Intravenous  PONV Risk Score and Plan: 4 or greater and Treatment may vary due to age or medical condition  Airway Management Planned: Oral ETT  Additional Equipment: None  Intra-op Plan:   Post-operative Plan: Extubation in OR  Informed Consent: I have reviewed the patients History and Physical, chart, labs and discussed the procedure including the risks, benefits and alternatives for the proposed anesthesia with the patient or authorized representative who has indicated his/her understanding and acceptance.     Dental advisory given  Plan Discussed with: CRNA and Anesthesiologist  Anesthesia Plan Comments:         Anesthesia Quick Evaluation

## 2021-04-05 ENCOUNTER — Encounter (HOSPITAL_COMMUNITY): Payer: Self-pay | Admitting: Orthopaedic Surgery

## 2021-04-05 DIAGNOSIS — S72002A Fracture of unspecified part of neck of left femur, initial encounter for closed fracture: Secondary | ICD-10-CM | POA: Diagnosis not present

## 2021-04-05 LAB — BASIC METABOLIC PANEL
Anion gap: 11 (ref 5–15)
BUN: 35 mg/dL — ABNORMAL HIGH (ref 8–23)
CO2: 29 mmol/L (ref 22–32)
Calcium: 8.6 mg/dL — ABNORMAL LOW (ref 8.9–10.3)
Chloride: 98 mmol/L (ref 98–111)
Creatinine, Ser: 2.73 mg/dL — ABNORMAL HIGH (ref 0.44–1.00)
GFR, Estimated: 17 mL/min — ABNORMAL LOW (ref >60)
Glucose, Bld: 182 mg/dL — ABNORMAL HIGH (ref 70–99)
Potassium: 4.6 mmol/L (ref 3.5–5.1)
Sodium: 138 mmol/L (ref 135–145)

## 2021-04-05 LAB — GLUCOSE, CAPILLARY
Glucose-Capillary: 166 mg/dL — ABNORMAL HIGH (ref 70–99)
Glucose-Capillary: 175 mg/dL — ABNORMAL HIGH (ref 70–99)
Glucose-Capillary: 123 mg/dL — ABNORMAL HIGH (ref 70–99)
Glucose-Capillary: 140 mg/dL — ABNORMAL HIGH (ref 70–99)

## 2021-04-05 LAB — CBC WITH DIFFERENTIAL/PLATELET
Abs Immature Granulocytes: 0.05 10*3/uL (ref 0.00–0.07)
Basophils Absolute: 0 10*3/uL (ref 0.0–0.1)
Basophils Relative: 0 %
Eosinophils Absolute: 0 10*3/uL (ref 0.0–0.5)
Eosinophils Relative: 0 %
HCT: 31.4 % — ABNORMAL LOW (ref 36.0–46.0)
Hemoglobin: 9 g/dL — ABNORMAL LOW (ref 12.0–15.0)
Immature Granulocytes: 1 %
Lymphocytes Relative: 7 %
Lymphs Abs: 0.7 10*3/uL (ref 0.7–4.0)
MCH: 28.9 pg (ref 26.0–34.0)
MCHC: 28.7 g/dL — ABNORMAL LOW (ref 30.0–36.0)
MCV: 101 fL — ABNORMAL HIGH (ref 80.0–100.0)
Monocytes Absolute: 0.6 10*3/uL (ref 0.1–1.0)
Monocytes Relative: 6 %
Neutro Abs: 8.6 10*3/uL — ABNORMAL HIGH (ref 1.7–7.7)
Neutrophils Relative %: 86 %
Platelets: 140 10*3/uL — ABNORMAL LOW (ref 150–400)
RBC: 3.11 MIL/uL — ABNORMAL LOW (ref 3.87–5.11)
RDW: 14.8 % (ref 11.5–15.5)
WBC: 10 10*3/uL (ref 4.0–10.5)
nRBC: 0 % (ref 0.0–0.2)

## 2021-04-05 LAB — HEMOGLOBIN A1C
Hgb A1c MFr Bld: 6.3 % — ABNORMAL HIGH (ref 4.8–5.6)
Mean Plasma Glucose: 134 mg/dL

## 2021-04-05 MED ORDER — SODIUM CHLORIDE 0.9 % IV SOLN: INTRAVENOUS | Status: DC

## 2021-04-05 NOTE — Consult Note (Signed)
° °  [facility] Inpatient Consult   04/05/2021  TAMANI DURNEY [birth year] [id number]  [facility] [ACO] Patient: [name]  Primary Care Provider:  Lujean Amel, MD [facility] [place] is an embedded provider with a Chronic Care Management team and program, and is listed for the transition of care follow up and appointments.  Patient has been active with a [facility].  However, this patient's provider now has an external chronic care management team and program.  Met with patient at the bedside and she confirms and endorses her PCP with [facility].  She is currently planning for a rehab stay.  She states she has been unable to get up with therapy yet.  Plan: Continue to follow for appropriate post hospital TOC needs. Update the [facility] RNCM of progress and needs and potential for an external CCM team for post hospital needs.  Will sign off when appropriate disposition is known.  Please contact for further questions,  Natividad Brood, RN BSN [facility] Liaison  [phone] business mobile phone Toll free office [phone]  Fax number: [phone] [email or web address]

## 2021-04-05 NOTE — Care Management Important Message (Signed)
Important Message  Patient Details  Name: [name] MRN: [id number] Date of Birth: 15-Dec-1945   Medicare Important Message Given:  Yes     Hannah Beat 04/05/2021, 2:00 PM

## 2021-04-05 NOTE — Plan of Care (Signed)

## 2021-04-05 NOTE — Progress Notes (Signed)
PROGRESS NOTE  [name]  DOB: 1945-10-19  PCP: Lujean Amel, MD VFI:[id number]  DOA: 04/03/2021  LOS: 2 days  Hospital Day: 3  Brief narrative: [name] is a 76 y.o. female with PMH significant for DM2, HTN, morbid obesity, OSA, OHS chronically on 2 L oxygen at home, CAD s/p CABG '16 (LIMA to LAD, SVG to D1, SVG to OM1 and OM2, SVG to PDA), HFpEF, complete heart block s/p PPM/ICD, CKD stage III, adrenal insufficiency. Patient presented to the ED on 04/03/2021 after a fall at home.  She lives at home with her son, ambulates inside the home without a walker but uses walker to walk.   On 2/22, patient was up and felt her left leg suddenly gave out and she fell on the floor landing on her left side and started immediately hurting in the left hip and leg.  Denies any premonition symptoms like chest pain, shortness of breath, palpitation, dizziness, nausea, vomiting.  Did not pass out or hit her head. EMS brought her to the ED.  In the ED, patient was hemodynamically stable. Labs with hemoglobin of 10.8, serum bicarb 33, BUN/creatinine 18/1.69, BNP 334. Chest x-ray with cardiomegaly and mild pulmonary vascular congestion. X-ray noted a displaced intertrochanteric left hip fracture with left superior pubic ramus fracture Admitted to hospitalist service Orthopedics consulted.  Subjective: Patient was seen and examined this morning. Propped up in bed.  Not in distress.  On 3 L oxygen.  Underwent fracture repair surgery yesterday. Postsurgically, patient was hypertensive for prolonged duration. Labs from this morning with creatinine rise.  Principal Problem:   Closed left hip fracture ([place]) secondary to fall at home Active Problems:   ANEMIA   HTN (hypertension), benign   Mixed hypercholesterolemia and hypertriglyceridemia   S/P CABG x 5   Chronic diastolic CHF (congestive heart failure) (HCC)   CHB (complete heart block) (HCC)   Diabetes mellitus without complication  (HCC)   Chronic respiratory failure (HCC)   Thrombocytopenia (HCC)   Closed fracture of left superior pubic ramus (HCC)   Chronic kidney disease, stage III B (moderate) ([place])   Fall at home, initial encounter   Hypokalemia    Assessment and Plan: Closed left hip fracture secondary to fall at home Left superior pubic ramus fracture -Patient had a mechanical fall at home impacting the left hip sustaining closed left hip fracture as well as the left superior pubic ramus fracture.   -Orthopedic consulted.   -2/23, patient underwent cephalomedullary nailing of left hip. -Pain control and DVT prophylaxis per orthopedics  AKI on CKD 3B -Baseline creatinine less than 1.7.   -Postoperatively, patient was hypertensive for few hours.  Her creatinine is elevated significantly to 2.73 today.  Diuretics on hold.  I started on IV fluid this morning.  Continue to monitor urine output. -If no improvement in creatinine tomorrow, we may need nephrology consultation.  Patient follows up with Dr. Moshe Cipro as an outpatient -Continue to monitor creatinine. Recent Labs    01/07/21 0419 01/07/21 1636 01/08/21 0647 01/09/21 0340 01/10/21 0039 01/21/21 1221 02/14/21 0112 03/18/21 1127 04/03/21 1000 04/04/21 0226 04/05/21 0438  BUN 18  --  16 20 26* 30* 24* 22 18 18  35*  CREATININE 1.51* 1.35* 1.36* 1.52* 1.76* 1.84* 1.58* 1.55* 1.69* 1.75* 2.73*   Chronic diastolic CHF Hypertension -Last echocardiogram noted EF of 55 to 60% with grade 1 diastolic dysfunction in 05/1658.   -Looks hypovolemic at this time. -Home meds include beta-blocker, Imdur, torsemide. -Currently  all her meds are on hold.  If blood pressure allows, will resume bisoprolol tomorrow.  CAD S/P CABG x 5 HLD -Last cardiac catheter was in 02/2019 where all territories were noted to have adequate coronary flow via bypasses or native vessels with stents. -Continue aspirin, Plavix, beta-blocker, isosorbide mononitrate and Crestor    CHB (complete heart block)    S/p PPM/ICD    Type 2 diabetes mellitus -A1c 6.5 in November 2022 -Home meds include Jardiance 10 mg daily -Currently on hold.  Continue sliding scale insulin. -Blood sugar level remain controlled Recent Labs  Lab 04/04/21 1556 04/04/21 1721 04/04/21 2053 04/05/21 0648 04/05/21 1221  GLUCAP 130* 149* 171* 166* 175*    Chronic mild anemia -Baseline hemoglobin more than 10.  Hemoglobin slightly lower today postoperatively.  Continue to monitor. Recent Labs    01/07/21 1636 02/14/21 0112 04/03/21 1000 04/04/21 0226 04/05/21 0438  HGB 10.8* 11.9* 10.8* 10.0* 9.0*  MCV 99.5 99.0 99.2 98.8 101.0*    Morbid obesity  -Body mass index is 41.24 kg/m. Patient has been advised to make an attempt to improve diet and exercise patterns to aid in weight loss.  Chronic respiratory failure  OSA/OHS -Continue nasal cannula oxygen 3 L  Mobility: Needs PT eval postprocedure Goals of care   Code Status: Full Code    Nutritional status:  Body mass index is 41.24 kg/m.      Diet:  Diet Order             Diet regular Room service appropriate? Yes; Fluid consistency: Thin  Diet effective now                   DVT prophylaxis:     Antimicrobials: None Fluid: None Consultants: Orthopedics Family Communication: None at bedside  Status is: Inpatient  Continue in-hospital care because: AKI postprocedure Level of care: Telemetry Surgical   Dispo: The patient is from: Home              Anticipated d/c is to: PT eval postprocedure              Patient currently is not medically stable to d/c.   Difficult to place patient No     Infusions:   sodium chloride 75 mL/hr at 04/05/21 0956    Scheduled Meds:  allopurinol  100 mg Oral Daily   aspirin EC  81 mg Oral Daily   clopidogrel  75 mg Oral Daily   cycloSPORINE  1 drop Both Eyes BID   insulin aspart  0-9 Units Subcutaneous TID WC   loratadine  10 mg Oral Daily    mometasone-formoterol  2 puff Inhalation BID   pantoprazole  40 mg Oral BID   polyethylene glycol  17 g Oral Daily   polyvinyl alcohol  1 drop Both Eyes Q12H   rosuvastatin  40 mg Oral QPM   senna  1 tablet Oral BID    PRN meds: acetaminophen, guaiFENesin, HYDROcodone-acetaminophen, morphine injection, ondansetron (ZOFRAN) IV   Antimicrobials: Anti-infectives (From admission, onward)    Start     Dose/Rate Route Frequency Ordered Stop   04/04/21 1423  vancomycin (VANCOCIN) powder  Status:  Discontinued          As needed 04/04/21 1424 04/04/21 1551   04/04/21 1200  vancomycin (VANCOREADY) IVPB 1500 mg/300 mL        1,500 mg 150 mL/hr over 120 Minutes Intravenous On call to O.R. 04/03/21 1241 04/04/21 1529   04/04/21 1145  ceFAZolin (ANCEF) IVPB 2g/100 mL premix  Status:  Discontinued        2 g 200 mL/hr over 30 Minutes Intravenous On call to O.R. 04/04/21 0942 04/04/21 1717       Objective: Vitals:   04/05/21 0831 04/05/21 1410  BP:  (!) 106/37  Pulse:  72  Resp:  18  Temp:  98.1 F (36.7 C)  SpO2: 100% 97%    Intake/Output Summary (Last 24 hours) at 04/05/2021 1428 Last data filed at 04/05/2021 0513 Gross per 24 hour  Intake 1190 ml  Output 1100 ml  Net 90 ml    Filed Weights   04/03/21 0917 04/03/21 1709  Weight: 103 kg 105.6 kg   Weight change:  Body mass index is 41.24 kg/m.   Physical Exam: General exam: Pleasant, elderly morbidly obese Caucasian female Skin: No rashes, lesions or ulcers. HEENT: Atraumatic, normocephalic, no obvious bleeding Lungs: Diminished air entry in both bases. CVS: Regular rate and rhythm, no murmur GI/Abd soft, nontender, distended from obesity, bowel sound present CNS: Alert, awake, oriented x3 Psychiatry: Mood appropriate Extremities: No pedal edema, no calf tenderness  Data Review: I have personally reviewed the laboratory data and studies available.  F/u labs ordered Unresulted Labs (From admission, onward)      Start     Ordered   04/05/21 0500  CBC with Differential/Platelet  Daily,   R     Question:  Specimen collection method  Answer:  Lab=Lab collect   04/04/21 1052   04/05/21 0814  Basic metabolic panel  Daily,   R     Question:  Specimen collection method  Answer:  Lab=Lab collect   04/04/21 1052            Signed, Terrilee Croak, MD [facility] 04/05/2021

## 2021-04-05 NOTE — Evaluation (Signed)
Physical Therapy Evaluation Patient Details Name: [name] MRN: [id number] DOB: [birth year] Today's Date: 04/05/2021  History of Present Illness  Pt is a 76 y.o. female admitted 04/03/21 after fall at home when leg gave out sustaining L displaced intertrochanteric hip fx. S/p L hip cephalomedullary nail and closed management of superior pubic ramus fx 2/23. PMH includes afib, CAD, CHF, HTN, pacemaker, asthma, DM2, stroke, OSA, cervical sx, back sx, R ankle fx.   Clinical Impression  Pt presents with an overall decrease in functional mobility secondary to above. PTA, pt reports independent, lives with son who works. Initiated educ re: precautions, positioning, activity recommendations. Today, pt required maxA for bed mobility, unable to achieve standing with assist+1. Pt motivated to participate, but anxious and distracted by anticipation of pain. Recommend SNF-level therapies to maximize functional mobility and independence prior to return home; pt in agreement.      Recommendations for follow up therapy are one component of a multi-disciplinary discharge planning process, led by the attending physician.  Recommendations may be updated based on patient status, additional functional criteria and insurance authorization.  Follow Up Recommendations Skilled nursing-short term rehab (<3 hours/day)    Assistance Recommended at Discharge Intermittent Supervision/Assistance  Patient can return home with the following  Two people to help with walking and/or transfers;Two people to help with bathing/dressing/bathroom;Assistance with cooking/housework;Assist for transportation;Help with stairs or ramp for entrance    Equipment Recommendations Rolling walker (2 wheels);BSC/3in1;Wheelchair (measurements PT);Wheelchair cushion (measurements PT)  Recommendations for Other Services       Functional Status Assessment Patient has had a recent decline in their functional status and demonstrates the  ability to make significant improvements in function in a reasonable and predictable amount of time.     Precautions / Restrictions Precautions Precautions: Fall;Other (comment) Precaution Comments: Wears 3L O2 baseline Restrictions Weight Bearing Restrictions: Yes LLE Weight Bearing: Weight bearing as tolerated      Mobility  Bed Mobility Overal bed mobility: Needs Assistance Bed Mobility: Supine to Sit, Sit to Supine     Supine to sit: Max assist, HOB elevated Sit to supine: Max assist   General bed mobility comments: Max, multimodal cues for sequencing as pt distracted by anticipation of pain; maxA for LLE management and trunk elevation, maxA scooting L hip to EOB; maxA for return to supine and repositioning    Transfers Overall transfer level: Needs assistance                 General transfer comment: performed 2x standing trials from EOB to RW, pt unable to clear buttocks from EOB despite maxA; pt repeats, "I can't, it hurts too much" requiring frequent redirection and encouragement; will require +2 assist    Ambulation/Gait                  Stairs            Wheelchair Mobility    Modified Rankin (Stroke Patients Only)       Balance Overall balance assessment: Needs assistance Sitting-balance support: Single extremity supported, Feet supported Sitting balance-Leahy Scale: Poor Sitting balance - Comments: reliant on UE support     Standing balance-Leahy Scale: Zero Standing balance comment: unable to achieve fully upright standing despite UE support and maxA                             Pertinent Vitals/Pain Pain Assessment Pain Assessment: Faces Faces Pain Scale: Hurts even  more Pain Location: LLE Pain Descriptors / Indicators: Grimacing, Guarding, Moaning Pain Intervention(s): Monitored during session, Limited activity within patient's tolerance, Repositioned    Home Living Family/patient expects to be discharged to::  Skilled nursing facility Living Arrangements: Children Available Help at Discharge: Family;Available PRN/intermittently               Additional Comments: Wears 3L O2 baseline. Son works during day, not available to assist    Prior Function Prior Level of Function : Independent/Modified Independent             Mobility Comments: Independent without DME. Reports enjoying watching TV and cooking/housework ADLs Comments: Reports mod indep with ADLs     Hand Dominance        Extremity/Trunk Assessment   Upper Extremity Assessment Upper Extremity Assessment: Generalized weakness    Lower Extremity Assessment Lower Extremity Assessment: Generalized weakness;LLE deficits/detail LLE Deficits / Details: s/p L hip IMN; knee functionally at least 3/5, hip with post-op pain and weakness (functionally <3/5) LLE: Unable to fully assess due to pain LLE Coordination: decreased gross motor       Communication   Communication: No difficulties  Cognition Arousal/Alertness: Awake/alert Behavior During Therapy: WFL for tasks assessed/performed, Anxious Overall Cognitive Status: No family/caregiver present to determine baseline cognitive functioning                                 General Comments: Pt A&O although intermittently stopped talking mid-sentence, or switched convo topics without finishing sentence - pt reports, "someone needs to check my brain, it's not working right." Anxious and distracted by anticipation of pain requiring frequent redirection from this        General Comments      Exercises     Assessment/Plan    PT Assessment Patient needs continued PT services  PT Problem List Decreased strength;Decreased range of motion;Decreased activity tolerance;Decreased balance;Decreased mobility;Decreased cognition;Decreased knowledge of use of DME;Decreased knowledge of precautions;Pain       PT Treatment Interventions DME instruction;Gait  training;Stair training;Functional mobility training;Therapeutic activities;Therapeutic exercise;Balance training;Patient/family education    PT Goals (Current goals can be found in the Care Plan section)  Acute Rehab PT Goals Patient Stated Goal: agreeable to post-acute rehab at SNF PT Goal Formulation: With patient Time For Goal Achievement: 04/19/21 Potential to Achieve Goals: Good    Frequency Min 3X/week     Co-evaluation               AM-PAC PT "6 Clicks" Mobility  Outcome Measure Help needed turning from your back to your side while in a flat bed without using bedrails?: A Lot Help needed moving from lying on your back to sitting on the side of a flat bed without using bedrails?: Total Help needed moving to and from a bed to a chair (including a wheelchair)?: Total Help needed standing up from a chair using your arms (e.g., wheelchair or bedside chair)?: Total Help needed to walk in hospital room?: Total Help needed climbing 3-5 steps with a railing? : Total 6 Click Score: 7    End of Session Equipment Utilized During Treatment: Gait belt;Oxygen Activity Tolerance: Patient tolerated treatment well;Patient limited by pain Patient left: in bed;with call bell/phone within reach;with bed alarm set Nurse Communication: Mobility status;Need for lift equipment PT Visit Diagnosis: Other abnormalities of gait and mobility (R26.89);Muscle weakness (generalized) (M62.81);Pain Pain - Right/Left: Left Pain - part of body: Leg  Time: 1439-1510 PT Time Calculation (min) (ACUTE ONLY): 31 min   Charges:   PT Evaluation $PT Eval Moderate Complexity: 1 Mod PT Treatments $Therapeutic Activity: 8-22 mins      Mabeline Caras, PT, DPT Acute Rehabilitation Services  Pager [phone] Office [place] 04/05/2021, 4:45 PM

## 2021-04-05 NOTE — Plan of Care (Signed)
°  Problem: Education: Goal: Knowledge of General Education information will improve Description: Including pain rating scale, medication(s)/side effects and non-pharmacologic comfort measures Outcome: Progressing   Problem: Clinical Measurements: Goal: Will remain free from infection Outcome: Progressing   Problem: Activity: Goal: Risk for activity intolerance will decrease Outcome: Progressing   Problem: Elimination: Goal: Will not experience complications related to bowel motility Outcome: Progressing   Problem: Pain Managment: Goal: General experience of comfort will improve Outcome: Progressing   Problem: Safety: Goal: Ability to remain free from injury will improve Outcome: Progressing

## 2021-04-06 ENCOUNTER — Inpatient Hospital Stay (HOSPITAL_COMMUNITY): Payer: Medicare Other

## 2021-04-06 DIAGNOSIS — S72002A Fracture of unspecified part of neck of left femur, initial encounter for closed fracture: Secondary | ICD-10-CM | POA: Diagnosis not present

## 2021-04-06 LAB — URINALYSIS, ROUTINE W REFLEX MICROSCOPIC
Bilirubin Urine: NEGATIVE
Glucose, UA: 150 mg/dL — AB
Ketones, ur: NEGATIVE mg/dL
Leukocytes,Ua: NEGATIVE
Nitrite: NEGATIVE
Protein, ur: 100 mg/dL — AB
Specific Gravity, Urine: 1.014 (ref 1.005–1.030)
pH: 5 (ref 5.0–8.0)

## 2021-04-06 LAB — BASIC METABOLIC PANEL
Anion gap: 9 (ref 5–15)
BUN: 52 mg/dL — ABNORMAL HIGH (ref 8–23)
CO2: 28 mmol/L (ref 22–32)
Calcium: 8 mg/dL — ABNORMAL LOW (ref 8.9–10.3)
Chloride: 97 mmol/L — ABNORMAL LOW (ref 98–111)
Creatinine, Ser: 3.8 mg/dL — ABNORMAL HIGH (ref 0.44–1.00)
GFR, Estimated: 12 mL/min — ABNORMAL LOW (ref >60)
Glucose, Bld: 150 mg/dL — ABNORMAL HIGH (ref 70–99)
Potassium: 4.5 mmol/L (ref 3.5–5.1)
Sodium: 134 mmol/L — ABNORMAL LOW (ref 135–145)

## 2021-04-06 LAB — GLUCOSE, CAPILLARY
Glucose-Capillary: 132 mg/dL — ABNORMAL HIGH (ref 70–99)
Glucose-Capillary: 112 mg/dL — ABNORMAL HIGH (ref 70–99)
Glucose-Capillary: 162 mg/dL — ABNORMAL HIGH (ref 70–99)
Glucose-Capillary: 162 mg/dL — ABNORMAL HIGH (ref 70–99)
Glucose-Capillary: 148 mg/dL — ABNORMAL HIGH (ref 70–99)
Glucose-Capillary: 135 mg/dL — ABNORMAL HIGH (ref 70–99)

## 2021-04-06 LAB — CBC WITH DIFFERENTIAL/PLATELET
Abs Immature Granulocytes: 0.06 10*3/uL (ref 0.00–0.07)
Basophils Absolute: 0 10*3/uL (ref 0.0–0.1)
Basophils Relative: 0 %
Eosinophils Absolute: 0 10*3/uL (ref 0.0–0.5)
Eosinophils Relative: 0 %
HCT: 23.4 % — ABNORMAL LOW (ref 36.0–46.0)
Hemoglobin: 7 g/dL — ABNORMAL LOW (ref 12.0–15.0)
Immature Granulocytes: 1 %
Lymphocytes Relative: 10 %
Lymphs Abs: 0.8 10*3/uL (ref 0.7–4.0)
MCH: 29.4 pg (ref 26.0–34.0)
MCHC: 29.9 g/dL — ABNORMAL LOW (ref 30.0–36.0)
MCV: 98.3 fL (ref 80.0–100.0)
Monocytes Absolute: 0.8 10*3/uL (ref 0.1–1.0)
Monocytes Relative: 10 %
Neutro Abs: 6.3 10*3/uL (ref 1.7–7.7)
Neutrophils Relative %: 79 %
Platelets: 116 10*3/uL — ABNORMAL LOW (ref 150–400)
RBC: 2.38 MIL/uL — ABNORMAL LOW (ref 3.87–5.11)
RDW: 14.8 % (ref 11.5–15.5)
WBC: 7.9 10*3/uL (ref 4.0–10.5)
nRBC: 0.3 % — ABNORMAL HIGH (ref 0.0–0.2)

## 2021-04-06 LAB — SODIUM, URINE, RANDOM: Sodium, Ur: 14 mmol/L

## 2021-04-06 LAB — CREATININE, URINE, RANDOM: Creatinine, Urine: 63.2 mg/dL

## 2021-04-06 MED ORDER — CHLORHEXIDINE GLUCONATE CLOTH 2 % EX PADS
6.0000 | MEDICATED_PAD | Freq: Every day | CUTANEOUS | Status: DC
  Administered 2021-04-07 – 2021-04-16 (×10): 6 via TOPICAL

## 2021-04-06 MED ORDER — MIDODRINE HCL 5 MG PO TABS
10.0000 mg | ORAL_TABLET | Freq: Three times a day (TID) | ORAL | Status: DC
  Administered 2021-04-07 – 2021-04-11 (×10): 10 mg via ORAL
  Filled 2021-04-06 (×10): qty 2

## 2021-04-06 NOTE — Progress Notes (Signed)
Physical Therapy Treatment Patient Details Name: [name] MRN: [id number] DOB: 06/03/1945 Today's Date: 04/06/2021   History of Present Illness Pt is a 76 y.o. female admitted 04/03/21 after fall at home when leg gave out sustaining L displaced intertrochanteric hip fx. S/p L hip cephalomedullary nail and closed management of superior pubic ramus fx 2/23. PMH includes afib, CAD, CHF, HTN, pacemaker, asthma, DM2, stroke, OSA, cervical sx, back sx, R ankle fx.    PT Comments    Pt is making slow but steady progress towards goals. She requires max encouragement due to fear of falling and anticipation of pain. She was able to progress OOB to recliner chair with use of stedy. Pt required varying degrees of assist as she was at times resistant to movement due to fear, but moved well once she initiated movement. Pt was left in chair with family present and lift pad placed for RN use. Will continue to follow acutely for mobility progression. Current plan remains appropriate.    Recommendations for follow up therapy are one component of a multi-disciplinary discharge planning process, led by the attending physician.  Recommendations may be updated based on patient status, additional functional criteria and insurance authorization.  Follow Up Recommendations  Skilled nursing-short term rehab (<3 hours/day)     Assistance Recommended at Discharge Intermittent Supervision/Assistance  Patient can return home with the following Two people to help with walking and/or transfers;Two people to help with bathing/dressing/bathroom;Assistance with cooking/housework;Assist for transportation;Help with stairs or ramp for entrance   Equipment Recommendations  Rolling walker (2 wheels);BSC/3in1;Wheelchair (measurements PT);Wheelchair cushion (measurements PT)    Recommendations for Other Services       Precautions / Restrictions Precautions Precautions: Fall;Other (comment) Precaution Comments: Wears 3L  O2 baseline Restrictions Weight Bearing Restrictions: Yes LLE Weight Bearing: Weight bearing as tolerated     Mobility  Bed Mobility Overal bed mobility: Needs Assistance Bed Mobility: Supine to Sit     Supine to sit: Max assist, HOB elevated, +2 for physical assistance, +2 for safety/equipment     General bed mobility comments: Max, multimodal cues for sequencing as pt distracted by anticipation of pain; maxA for LLE management and trunk elevation, maxA scooting L hip to EOB;    Transfers Overall transfer level: Needs assistance   Transfers: Sit to/from Stand, Bed to chair/wheelchair/BSC Sit to Stand: Max assist, +2 physical assistance, +2 safety/equipment, Mod assist           General transfer comment: Pt with 2x attempts to stand from EOB with Stedy. VC required for technique and encouragement as pt is fearful of pain. Varying decrees of assist required. Once pt initiates movement she can rise with mod A, but at times Max A due to resistance. Transfer via Lift Equipment: Stedy  Ambulation/Gait                   Stairs             Wheelchair Mobility    Modified Rankin (Stroke Patients Only)       Balance Overall balance assessment: Needs assistance Sitting-balance support: Single extremity supported, Feet supported Sitting balance-Leahy Scale: Poor Sitting balance - Comments: reliant on UE support     Standing balance-Leahy Scale: Zero Standing balance comment: required use of lift equiptment to stand.                            Cognition Arousal/Alertness: Awake/alert Behavior During Therapy: [facility]  for tasks assessed/performed, Anxious Overall Cognitive Status: No family/caregiver present to determine baseline cognitive functioning                                 General Comments: Fearful at times, but able to push through with encouragement and patence.        Exercises      General Comments         Pertinent Vitals/Pain Pain Assessment Pain Assessment: 0-10 Pain Score: 8  Pain Location: LLE Pain Descriptors / Indicators: Grimacing, Guarding, Moaning Pain Intervention(s): Monitored during session, Limited activity within patient's tolerance, Repositioned, Patient requesting pain meds-RN notified, RN gave pain meds during session    Home Living Family/patient expects to be discharged to:: Skilled nursing facility Living Arrangements: Children Available Help at Discharge: Family;Available PRN/intermittently               Additional Comments: Wears 3L O2 baseline. Son works during day, not available to assist    Prior Function            PT Goals (current goals can now be found in the care plan section) Acute Rehab PT Goals Patient Stated Goal: agreeable to post-acute rehab at SNF PT Goal Formulation: With patient Time For Goal Achievement: 04/19/21 Potential to Achieve Goals: Good Progress towards PT goals: Progressing toward goals    Frequency    Min 3X/week      PT Plan Current plan remains appropriate    Co-evaluation PT/OT/SLP Co-Evaluation/Treatment: Yes Reason for Co-Treatment: For patient/therapist safety;To address functional/ADL transfers PT goals addressed during session: Mobility/safety with mobility;Balance;Proper use of DME;Strengthening/ROM        AM-PAC PT "6 Clicks" Mobility   Outcome Measure  Help needed turning from your back to your side while in a flat bed without using bedrails?: A Lot Help needed moving from lying on your back to sitting on the side of a flat bed without using bedrails?: Total Help needed moving to and from a bed to a chair (including a wheelchair)?: Total Help needed standing up from a chair using your arms (e.g., wheelchair or bedside chair)?: Total Help needed to walk in hospital room?: Total Help needed climbing 3-5 steps with a railing? : Total 6 Click Score: 7    End of Session Equipment Utilized During  Treatment: Gait belt;Oxygen Activity Tolerance: Patient tolerated treatment well;Patient limited by pain Patient left: with call bell/phone within reach;in chair;with family/visitor present (Lift pad placed) Nurse Communication: Mobility status;Need for lift equipment PT Visit Diagnosis: Other abnormalities of gait and mobility (R26.89);Muscle weakness (generalized) (M62.81);Pain Pain - Right/Left: Left Pain - part of body: Leg     Time: 6286-3817 PT Time Calculation (min) (ACUTE ONLY): 44 min  Charges:  $Therapeutic Activity: 23-37 mins                     Benjiman Core, [place] Pager [phone] Acute Rehab  Allena Katz 04/06/2021, 12:43 PM

## 2021-04-06 NOTE — Progress Notes (Signed)
Mobility Specialist Progress Note    04/06/21 1351  Mobility  Activity Transferred from chair to bed  Level of Assistance +2 (takes two people)  Assistive Device MaxiMove  Activity Response Tolerated fair  $Mobility charge 1 Mobility   Left with RN present.   [facility] Mobility Specialist  M.S. 5N: [phone]

## 2021-04-06 NOTE — Progress Notes (Signed)
° °  Subjective:  Patient reports pain as moderate.  Pending PT evaluation.  Tolerating diet.  Voiding.  Objective:   VITALS:   Vitals:   04/05/21 1410 04/05/21 2022 04/05/21 2257 04/06/21 0343  BP: (!) 106/37  (!) 123/50   Pulse: 72 78 86   Resp: 18 18 16    Temp: 98.1 F (36.7 C)  98.2 F (36.8 C)   TempSrc: Oral  Oral   SpO2: 97% 100% 97%   Weight:    107.6 kg  Height:      Dressing is clean dry intact.  Is able to dorsiflex plantarflex the left foot.  Fires EHL gastrocsoleus.  Sensation is intact throughout.  2+ dorsalis pedis pulse  Lab Results  Component Value Date   WBC 7.9 04/06/2021   HGB 7.0 (L) 04/06/2021   HCT 23.4 (L) 04/06/2021   MCV 98.3 04/06/2021   PLT 116 (L) 04/06/2021     Assessment/Plan:  1 day postop status post left hip cephalomedullary nail doing well  - Expected postop acute blood loss anemia  - Patient to work with PT/OT to optimize mobilization safely - DVT ppx - SCDs, ambulation, okay with home anticoagulation aspirin Plavix - WBAT operative extremity  Taren Toops 04/06/2021, 8:05 AM

## 2021-04-06 NOTE — Progress Notes (Addendum)
PROGRESS NOTE  [name]  DOB: 06-15-1945  PCP: Alicia Amel, MD YTK:[id number]  DOA: 04/03/2021  LOS: 3 days  Hospital Day: 4  Brief narrative: [name] is a 76 y.o. female with PMH significant for DM2, HTN, morbid obesity, OSA, OHS chronically on 2 L oxygen at home, CAD s/p CABG '16 (LIMA to LAD, SVG to D1, SVG to OM1 and OM2, SVG to PDA), HFpEF, complete heart block s/p PPM/ICD, CKD stage III, adrenal insufficiency. Patient presented to the ED on 04/03/2021 after a fall at home.  She lives at home with her son, ambulates inside the home without a walker but uses walker to walk.   On 2/22, patient was up and felt her left leg suddenly gave out and she fell on the floor landing on her left side and started immediately hurting in the left hip and leg.  Denies any premonition symptoms like chest pain, shortness of breath, palpitation, dizziness, nausea, vomiting.  Did not pass out or hit her head. EMS brought her to the ED.  In the ED, patient was hemodynamically stable. Labs with hemoglobin of 10.8, serum bicarb 33, BUN/creatinine 18/1.69, BNP 334. Chest x-ray with cardiomegaly and mild pulmonary vascular congestion. X-ray noted a displaced intertrochanteric left hip fracture with left superior pubic ramus fracture Admitted to hospitalist service Orthopedics consulted.  Subjective: Patient was seen and examined this morning. Does not feel good overall.  No specific complaints.  Remains on 3 L oxygen which is her baseline requirement. Labs from this morning with worsening creatinine.  Principal Problem:   Closed left hip fracture ([place]) secondary to fall at home Active Problems:   ANEMIA   HTN (hypertension), benign   Mixed hypercholesterolemia and hypertriglyceridemia   S/P CABG x 5   Chronic diastolic CHF (congestive heart failure) (HCC)   CHB (complete heart block) (HCC)   Diabetes mellitus without complication (HCC)   Chronic respiratory failure (HCC)    Thrombocytopenia (HCC)   Closed fracture of left superior pubic ramus (HCC)   Chronic kidney disease, stage III B (moderate) ([place])   Fall at home, initial encounter   Hypokalemia    Assessment and Plan: AKI on CKD 3B -Baseline creatinine less than 1.7.   -Postoperatively, patient was hypotensive for few hours.   -Her creatinine has been trending up since surgery.   -I held her diuretics before the surgery.  Postsurgically, she has remained on IV hydration.  Despite that, creatinine continues to worsen.  Renal ultrasound ordered.  Urine studies ordered.  -Patient follows up with Dr. Moshe Cipro as an outpatient.  Nephrology consulted today. Recent Labs    01/08/21 0647 01/09/21 0340 01/10/21 0039 01/21/21 1221 02/14/21 0112 03/18/21 1127 04/03/21 1000 04/04/21 0226 04/05/21 0438 04/06/21 0457  BUN 16 20 26* 30* 24* 22 18 18  35* 52*  CREATININE 1.36* 1.52* 1.76* 1.84* 1.58* 1.55* 1.69* 1.75* 2.73* 3.80*   Closed left hip fracture secondary to fall at home Left superior pubic ramus fracture -Patient had a mechanical fall at home impacting the left hip sustaining closed left hip fracture as well as the left superior pubic ramus fracture.   -2/23, patient underwent cephalomedullary nailing of left hip. -Pain control and DVT prophylaxis per orthopedics  Chronic diastolic CHF Hypertension -Last echocardiogram noted EF of 55 to 60% with grade 1 diastolic dysfunction in 08/5168.  Right ventricular size and systolic function normal. -Does not look volume overloaded at this time -Home meds include beta-blocker, Imdur, torsemide. -Currently all her meds  are on hold.   CAD S/P CABG x 5 HLD -Last cardiac catheter was in 02/2019 where all territories were noted to have adequate coronary flow via bypasses or native vessels with stents. -Continue aspirin, Plavix, beta-blocker, isosorbide mononitrate and Crestor   CHB (complete heart block)    S/p PPM/ICD    Type 2 diabetes  mellitus -A1c 6.5 in November 2022 -Home meds include Jardiance 10 mg daily -Currently on hold.  Continue sliding scale insulin. -Blood sugar level remain controlled Recent Labs  Lab 04/05/21 2046 04/06/21 0607 04/06/21 1050 04/06/21 1331 04/06/21 1537  GLUCAP 140* 132* 112* 162* 162*   Acute blood loss anemia  Chronic mild macrocytic anemia -Baseline hemoglobin more than 10.   -Postoperatively, hemoglobin running low.  Significantly down at 7 today.  Continue to monitor.  If less than 7, will transfuse tomorrow.   Recent Labs    02/14/21 0112 04/03/21 1000 04/04/21 0226 04/05/21 0438 04/06/21 0457  HGB 11.9* 10.8* 10.0* 9.0* 7.0*  MCV 99.0 99.2 98.8 101.0* 98.3   Morbid obesity  -Body mass index is 42.02 kg/m. Patient has been advised to make an attempt to improve diet and exercise patterns to aid in weight loss.  Chronic respiratory failure  OSA/OHS -Continue nasal cannula oxygen 3 L  Mobility: Needs PT eval postprocedure Goals of care   Code Status: Full Code    Nutritional status:  Body mass index is 42.02 kg/m.      Diet:  Diet Order             Diet regular Room service appropriate? Yes; Fluid consistency: Thin  Diet effective now                   DVT prophylaxis:     Antimicrobials: None Fluid: None Consultants: Orthopedics Family Communication: None at bedside.  Called and updated patient's son Mr. [name] this afternoon.  Status is: Inpatient  Continue in-hospital care because: AKI postprocedure Level of care: Telemetry Surgical   Dispo: The patient is from: Home              Anticipated d/c is to: PT eval postprocedure              Patient currently is not medically stable to d/c.   Difficult to place patient No     Infusions:   sodium chloride 75 mL/hr at 04/06/21 1404    Scheduled Meds:  allopurinol  100 mg Oral Daily   aspirin EC  81 mg Oral Daily   clopidogrel  75 mg Oral Daily   cycloSPORINE  1 drop Both  Eyes BID   insulin aspart  0-9 Units Subcutaneous TID WC   loratadine  10 mg Oral Daily   mometasone-formoterol  2 puff Inhalation BID   pantoprazole  40 mg Oral BID   polyethylene glycol  17 g Oral Daily   polyvinyl alcohol  1 drop Both Eyes Q12H   rosuvastatin  40 mg Oral QPM   senna  1 tablet Oral BID    PRN meds: acetaminophen, guaiFENesin, HYDROcodone-acetaminophen, morphine injection, ondansetron (ZOFRAN) IV   Antimicrobials: Anti-infectives (From admission, onward)    Start     Dose/Rate Route Frequency Ordered Stop   04/04/21 1423  vancomycin (VANCOCIN) powder  Status:  Discontinued          As needed 04/04/21 1424 04/04/21 1551   04/04/21 1200  vancomycin (VANCOREADY) IVPB 1500 mg/300 mL        1,500  mg 150 mL/hr over 120 Minutes Intravenous On call to O.R. 04/03/21 1241 04/04/21 1529   04/04/21 1145  ceFAZolin (ANCEF) IVPB 2g/100 mL premix  Status:  Discontinued        2 g 200 mL/hr over 30 Minutes Intravenous On call to O.R. 04/04/21 0942 04/04/21 1717       Objective: Vitals:   04/06/21 1127 04/06/21 1227  BP:    Pulse: 82 72  Resp: 18 20  Temp:    SpO2: 92% 96%   No intake or output data in the 24 hours ending 04/06/21 1622  Filed Weights   04/03/21 0917 04/03/21 1709 04/06/21 0343  Weight: 103 kg 105.6 kg 107.6 kg   Weight change:  Body mass index is 42.02 kg/m.   Physical Exam: General exam: Pleasant, elderly morbidly obese Caucasian female Skin: No rashes, lesions or ulcers. HEENT: Atraumatic, normocephalic, no obvious bleeding Lungs: Diminished air entry in both bases. CVS: Regular rate and rhythm, no murmur GI/Abd soft, nontender, distended from obesity, bowel sound present CNS: Alert, awake, oriented x3 Psychiatry: Sad affect Extremities: No pedal edema, no calf tenderness  Data Review: I have personally reviewed the laboratory data and studies available.  F/u labs ordered Unresulted Labs (From admission, onward)     Start      Ordered   04/07/21 0500  ABO/Rh  Tomorrow morning,   R        04/06/21 1622   04/06/21 0837  Protein / creatinine ratio, urine  Once,   R        04/06/21 0836   04/06/21 0837  Creatinine, urine, random  Once,   R        04/06/21 0836   04/06/21 0837  Urinalysis, Routine w reflex microscopic  Once,   R        04/06/21 0836   04/05/21 0500  CBC with Differential/Platelet  Daily,   R     Question:  Specimen collection method  Answer:  Lab=Lab collect   04/04/21 1052   04/05/21 4259  Basic metabolic panel  Daily,   R     Question:  Specimen collection method  Answer:  Lab=Lab collect   04/04/21 1052            Signed, Terrilee Croak, MD [facility] 04/06/2021

## 2021-04-06 NOTE — Evaluation (Signed)
Occupational Therapy Evaluation Patient Details Name: [name] MRN: [id number] DOB: [birth year] Today's Date: 04/06/2021   History of Present Illness Pt is a 76 y.o. female admitted 04/03/21 after fall at home when leg gave out sustaining L displaced intertrochanteric hip fx. S/p L hip cephalomedullary nail and closed management of superior pubic ramus fx 2/23. PMH includes afib, CAD, CHF, HTN, pacemaker, asthma, DM2, stroke, OSA, cervical sx, back sx, R ankle fx.   Clinical Impression   Pt mod I with ADLs and functional mobility at baseline, currently max A - max A +2 for ADLs, mod-max A +2 for bed mobility, and max A +2 with use of STEDY for transfers. Pt with soft BP throughout session, however denies dizziness/lightheadedness. VSS on 3L O2. Pt very anxious during session, shaking at times (UE specifically), benefits from pursed lip breathing to calm during session. Pt presenting with impairments listed below, will follow acutely. Recommend pt d/c to SNF.      Recommendations for follow up therapy are one component of a multi-disciplinary discharge planning process, led by the attending physician.  Recommendations may be updated based on patient status, additional functional criteria and insurance authorization.   Follow Up Recommendations  Skilled nursing-short term rehab (<3 hours/day)    Assistance Recommended at Discharge Intermittent Supervision/Assistance  Patient can return home with the following Two people to help with walking and/or transfers;Two people to help with bathing/dressing/bathroom;Assist for transportation;Help with stairs or ramp for entrance;Assistance with cooking/housework    Functional Status Assessment  Patient has had a recent decline in their functional status and demonstrates the ability to make significant improvements in function in a reasonable and predictable amount of time.  Equipment Recommendations  None recommended by OT;Other (comment) (defer to  next venue of care)    Recommendations for Other Services       Precautions / Restrictions Precautions Precautions: Fall;Other (comment) Precaution Comments: Wears 3L O2 baseline Restrictions Weight Bearing Restrictions: Yes LLE Weight Bearing: Weight bearing as tolerated      Mobility Bed Mobility Overal bed mobility: Needs Assistance Bed Mobility: Supine to Sit, Sit to Supine     Supine to sit: Mod assist, Max assist, +2 for physical assistance     General bed mobility comments: increased assistance for scooting hips fwd and trunk elevation    Transfers Overall transfer level: Needs assistance   Transfers: Sit to/from Stand, Bed to chair/wheelchair/BSC Sit to Stand: Max assist, +2 physical assistance           General transfer comment: pt fatiguing quickly, very anxious with use of STEDY Transfer via Lift Equipment: Stedy    Balance Overall balance assessment: Needs assistance Sitting-balance support: Single extremity supported, Feet supported Sitting balance-Leahy Scale: Poor Sitting balance - Comments: reliant on UE support Postural control: Right lateral lean   Standing balance-Leahy Scale: Zero Standing balance comment: unable                           ADL either performed or assessed with clinical judgement   ADL Overall ADL's : Needs assistance/impaired Eating/Feeding: Set up;Sitting   Grooming: Set up;Sitting   Upper Body Bathing: Moderate assistance;Sitting Upper Body Bathing Details (indicate cue type and reason): to apply lotion to back Lower Body Bathing: Maximal assistance;Sitting/lateral leans   Upper Body Dressing : Moderate assistance;Sitting   Lower Body Dressing: Maximal assistance;Sitting/lateral leans Lower Body Dressing Details (indicate cue type and reason): to don socks Toilet Transfer: Maximal  assistance;+2 for physical assistance;Stand-pivot;BSC/3in1   Toileting- Clothing Manipulation and Hygiene: Maximal  assistance;Sitting/lateral lean       Functional mobility during ADLs: Maximal assistance;Moderate assistance;+2 for physical assistance       Vision   Vision Assessment?: No apparent visual deficits     Perception     Praxis      Pertinent Vitals/Pain Pain Assessment Pain Assessment: Faces Pain Score: 8  Faces Pain Scale: Hurts whole lot Pain Location: LLE Pain Descriptors / Indicators: Grimacing, Guarding, Moaning Pain Intervention(s): Monitored during session, Limited activity within patient's tolerance, RN gave pain meds during session, Repositioned     Hand Dominance Right   Extremity/Trunk Assessment Upper Extremity Assessment Upper Extremity Assessment: Generalized weakness   Lower Extremity Assessment Lower Extremity Assessment: Defer to PT evaluation   Cervical / Trunk Assessment Cervical / Trunk Assessment: Kyphotic   Communication Communication Communication: No difficulties   Cognition Arousal/Alertness: Awake/alert Behavior During Therapy: WFL for tasks assessed/performed, Anxious Overall Cognitive Status: Within Functional Limits for tasks assessed                                       General Comments  VSS on 3L O2, BP soft but WFL during session, pt asymptomatic    Exercises     Shoulder Instructions      Home Living Family/patient expects to be discharged to:: Skilled nursing facility Living Arrangements: Children Available Help at Discharge: Family;Available PRN/intermittently                             Additional Comments: Wears 3L O2 baseline. Son works during day, not available to assist      Prior Functioning/Environment Prior Level of Function : Independent/Modified Independent             Mobility Comments: Independent without DME. Reports enjoying watching TV and cooking/housework ADLs Comments: Reports mod indep with ADLs        OT Problem List: Decreased strength;Decreased activity  tolerance;Decreased range of motion;Impaired balance (sitting and/or standing);Decreased safety awareness;Decreased knowledge of use of DME or AE      OT Treatment/Interventions: Self-care/ADL training;Therapeutic exercise;DME and/or AE instruction;Therapeutic activities;Patient/family education;Balance training    OT Goals(Current goals can be found in the care plan section) Acute Rehab OT Goals Patient Stated Goal: none stated OT Goal Formulation: With patient Time For Goal Achievement: 04/20/21 Potential to Achieve Goals: Good ADL Goals Pt Will Perform Upper Body Dressing: with min assist;sitting Pt Will Perform Lower Body Dressing: with adaptive equipment;with mod assist;sitting/lateral leans Pt Will Transfer to Toilet: with mod assist;with +2 assist;stand pivot transfer;bedside commode  OT Frequency: Min 2X/week    Co-evaluation PT/OT/SLP Co-Evaluation/Treatment: Yes Reason for Co-Treatment: For patient/therapist safety;To address functional/ADL transfers PT goals addressed during session: Mobility/safety with mobility;Balance;Proper use of DME;Strengthening/ROM OT goals addressed during session: ADL's and self-care      AM-PAC OT "6 Clicks" Daily Activity     Outcome Measure Help from another person eating meals?: None Help from another person taking care of personal grooming?: A Little Help from another person toileting, which includes using toliet, bedpan, or urinal?: Total Help from another person bathing (including washing, rinsing, drying)?: A Lot Help from another person to put on and taking off regular upper body clothing?: A Lot Help from another person to put on and taking off regular lower body clothing?:  Total 6 Click Score: 13   End of Session Equipment Utilized During Treatment: Gait belt;Oxygen;Other (comment) (STEDY) Nurse Communication: Mobility status;Need for lift equipment  Activity Tolerance: Patient tolerated treatment well Patient left: in chair;with  call bell/phone within reach;with chair alarm set  OT Visit Diagnosis: Unsteadiness on feet (R26.81);Other abnormalities of gait and mobility (R26.89);Muscle weakness (generalized) (M62.81)                Time: 6962-9528 OT Time Calculation (min): 45 min Charges:  OT General Charges $OT Visit: 1 Visit OT Evaluation $OT Eval Moderate Complexity: [address], OTD, OTR/L Acute Rehab [phone]) [phone] [facility] 04/06/2021, 12:52 PM

## 2021-04-06 NOTE — Plan of Care (Signed)
  Problem: Education: Goal: Knowledge of General Education information will improve Description: Including pain rating scale, medication(s)/side effects and non-pharmacologic comfort measures Outcome: Progressing   Problem: Activity: Goal: Risk for activity intolerance will decrease Outcome: Progressing   Problem: Coping: Goal: Level of anxiety will decrease Outcome: Progressing   Problem: Pain Managment: Goal: General experience of comfort will improve Outcome: Progressing   Problem: Safety: Goal: Ability to remain free from injury will improve Outcome: Progressing   Problem: Skin Integrity: Goal: Risk for impaired skin integrity will decrease Outcome: Progressing   

## 2021-04-06 NOTE — Progress Notes (Signed)
° °  Subjective:  Patient reports pain as moderate.  Was able to work with PT but in a limited fashion.  Hemoglobin is low this morning less hungry this morning.  Objective:   VITALS:   Vitals:   04/05/21 1410 04/05/21 2022 04/05/21 2257 04/06/21 0343  BP: (!) 106/37  (!) 123/50   Pulse: 72 78 86   Resp: 18 18 16    Temp: 98.1 F (36.7 C)  98.2 F (36.8 C)   TempSrc: Oral  Oral   SpO2: 97% 100% 97%   Weight:    107.6 kg  Height:      Dressing is clean dry intact.  Is able to dorsiflex plantarflex the left foot.  Fires EHL gastrocsoleus.  Sensation is intact throughout.  2+ dorsalis pedis pulse  Lab Results  Component Value Date   WBC 7.9 04/06/2021   HGB 7.0 (L) 04/06/2021   HCT 23.4 (L) 04/06/2021   MCV 98.3 04/06/2021   PLT 116 (L) 04/06/2021     Assessment/Plan:  2 day postop status post left hip cephalomedullary nail doing well, acute blood loss anemia this morning.  - Expected postop acute blood loss anemia -this is particularly in the setting of longer term aspirin Plavix usage.  Recommend blood transfusion at this time - Patient to work with PT/OT to optimize mobilization safely when able to safely do so - DVT ppx - SCDs, ambulation, okay with home anticoagulation aspirin Plavix - WBAT operative extremity  Fontaine Kossman 04/06/2021, 8:06 AM

## 2021-04-06 NOTE — Progress Notes (Signed)
° ° ° °  Subjective: 2 Days Post-Op Procedure(s) (LRB): INTRAMEDULLARY (IM) NAIL INTERTROCHANTRIC (Left) Awake, alert oriented x 4. Morbidly obese, oxygen pnc. " I can bearly move "  Patient reports pain as moderate.    Objective:   VITALS:  Temp:  [98.1 F (36.7 C)-98.2 F (36.8 C)] 98.2 F (36.8 C) (02/24 2257) Pulse Rate:  [72-86] 86 (02/24 2257) Resp:  [16-18] 16 (02/24 2257) BP: (106-123)/(37-50) 123/50 (02/24 2257) SpO2:  [97 %-100 %] 99 % (02/25 0911) Weight:  [107.6 kg] 107.6 kg (02/25 0343)  Neurologically intact ABD soft Neurovascular intact Sensation intact distally Intact pulses distally Dorsiflexion/Plantar flexion intact Incision: dressing C/D/I and no drainage   LABS Recent Labs    04/04/21 0226 04/05/21 0438 04/06/21 0457  HGB 10.0* 9.0* 7.0*  WBC 7.1 10.0 7.9  PLT 156 140* 116*   Recent Labs    04/05/21 0438 04/06/21 0457  NA 138 134*  K 4.6 4.5  CL 98 97*  CO2 29 28  BUN 35* 52*  CREATININE 2.73* 3.80*  GLUCOSE 182* 150*   No results for input(s): LABPT, INR in the last 72 hours.   Assessment/Plan: 2 Days Post-Op Procedure(s) (LRB): INTRAMEDULLARY (IM) NAIL INTERTROCHANTRIC (Left)  Advance diet Up with therapy Hgb is 7.0 and transfuse is likely, Dr. Sammuel Hines has seen and will assist with decision for  giviiiiiiii  [name] 04/06/2021, 10:50 AM Patient ID: [name], female   DOB: [birth year], 76 y.o.   MRN: [id number]

## 2021-04-06 NOTE — Consult Note (Addendum)
Renal Service Consult Note [facility]  [name] 04/06/2021 Sol Blazing, MD Requesting Physician: Dr Pietro Cassis  Reason for Consult: Renal failure HPI: The patient is a 76 y.o. year-old w/ hx of DM2, HTN, morbid obesity, OSA, OHS, chronic resp failure on 2 L O2, CAD s/p CABG '16, HFpEF, complete heart block s/p PPM/ICD, CKD stage III, adrenal insufficiency who presented on 04/03/21 after falling at home w/ subsequent L hip and leg pain. In ED srays showed a L hip fracture and L pelvic fracture (pubic ramus). Hb was 10, creat 1.69.  Pt was admitted and orthopedics consulted.  Pt had hip surgery on 2/23. Creat has worsened in the hospital up to 3.80 today. Asked to see for renal failure.    On admission her diuretics were held prior to surgery and she has been getting IVF"s. Pt is f/b Dr Moshe Cipro as an OP.   Pt seen in her room. Some mild SOB and swelling since arriving here. On 3L [place] (2L at home). No chest pain or abd pain.    ROS - denies CP, no joint pain, no HA, no blurry vision, no rash, no diarrhea, no nausea/ vomiting, no dysuria, no difficulty voiding   Past Medical History  Past Medical History:  Diagnosis Date   ABDOMINAL PAIN -GENERALIZED 02/21/2010   Qualifier: Diagnosis of  By: Chester Holstein NP, Nevin Bloodgood     AICD (automatic cardioverter/defibrillator) present    downgraded to CRT-P in 2014   Anemia    Arthritis    Asthma    Atrial fibrillation ([place]) 10/24/2015   Questionable history of A Fib/A Flutter. On device interrogation on 9/7, showed 0.76mn of A Fib/A Flutter with 1% burden.  Device check in Jan 2018 showed no sustained AF (runs of less than 30 seconds). No anticoagulation indicated.   CAD (coronary artery disease)    a. 10/09/16 LHC: SVG->LAD patent, SVG->Diag patent, SVG->RCA patent, SVG->LCx occluded. EF 60%, b. 10/31/16 LHC DES to AV groove Circ, DES to intermed branch   Cellulitis and abscess of foot 12/2014   RT FOOT   CHF (congestive heart  failure) (HCC)    Chronic bronchitis (HCC)    Chronic lower back pain    Complete heart block ([place] 06/22/2015   Diverticulosis    Facial numbness 02/2016   Fatty liver disease, nonalcoholic    Gastritis    GERD (gastroesophageal reflux disease)    Gout    H/O hiatal hernia    HEMORRHOIDS-EXTERNAL 02/21/2010   Qualifier: Diagnosis of  By: GChester HolsteinNP, Paula     High cholesterol    Hyperplastic colon polyp    Hypertension    IBS (irritable bowel syndrome)    Internal hemorrhoids    INTERNAL HEMORRHOIDS WITHOUT MENTION COMP 04/12/2007   Qualifier: Diagnosis of  By: BOlevia PerchesMD, Dora M    Nonischemic cardiomyopathy ([place] 11/22/2011   Pt responded to BiV ICD- last EF 55-60% Sept 2017   Obesity    On home oxygen therapy    "2L at night" (10/31/2016)   OSA (obstructive sleep apnea)    "can't tolerate a mask" (10/30/2016)   PERSONAL HX COLONIC POLYPS 02/21/2010   Qualifier: Diagnosis of  By: GChester HolsteinNP, Paula     Pneumonia    "couple times" (10/31/2016)   Presence of permanent cardiac pacemaker    11/02/12 [employer] V273 INTUA PPM   Right facial numbness 03/05/2016   Shortness of breath    [place]roke ([place] 09/[place]   TIA (  transient ischemic attack)    "recently" (10/31/2016)   Type II diabetes mellitus ([place])    Past Surgical History  Past Surgical History:  Procedure Laterality Date   ABDOMINAL ULTRASOUND  12/01/2011   Peripelvic cysts- #1- 2.4x1.9x2.3cm, #2-1.2x0.9x1.2cm   ANKLE FRACTURE SURGERY Right    "had rod put in"   ANTERIOR CERVICAL DECOMP/DISCECTOMY FUSION     APPENDECTOMY     BACK SURGERY     BIOPSY  12/29/2017   Procedure: BIOPSY;  Surgeon: [name], MD;  Location: [facility];  Service: Endoscopy;;   BIV ICD INSERTION CRT-D  2001?   BIV PACEMAKER GENERATOR CHANGE OUT N/A 11/02/2012   Procedure: BIV PACEMAKER GENERATOR CHANGE OUT;  Surgeon: Sanda Klein, MD;  Location: [place] [facility];  Service: Cardiovascular;  Laterality: N/A;   CARDIAC  CATHETERIZATION  05/17/1999   No significant coronary obstructive disease w/ mild 20% luminal irregularity of the first diag branch of the LAD   CARDIAC CATHETERIZATION  07/08/2002   No significant CAD, moderately depressed LV systolic function   CARDIAC CATHETERIZATION Bilateral 04/26/2007   Normal findings, recommend medical therapy   CARDIAC CATHETERIZATION  02/18/2008   Moderate CAD, would benefit from having a functional study, recommend continue medical therapy   CARDIAC CATHETERIZATION  07/23/2012   Medical therapy   CARDIAC CATHETERIZATION N/A 11/24/2014   Procedure: Left Heart Cath and Coronary Angiography;  Surgeon: Troy Sine, MD;  Location: [place] CV LAB;  Service: Cardiovascular;  Laterality: N/A;   CARDIAC CATHETERIZATION  11/27/2014   Procedure: Intravascular Pressure Wire/FFR Study;  Surgeon: Peter M Martinique, MD;  Location: [place] CV LAB;  Service: Cardiovascular;;   CARDIAC CATHETERIZATION  10/09/2016   CHOLECYSTECTOMY N/A 04/09/2018   Procedure: LAPAROSCOPIC CHOLECYSTECTOMY;  Surgeon: Greer Pickerel, MD;  [facility]: [facility];  Service: General;  Laterality: N/A;   COLONOSCOPY WITH PROPOFOL N/A 12/29/2017   Procedure: COLONOSCOPY WITH PROPOFOL;  Surgeon: [name], MD;  Location: [facility];  Service: Endoscopy;  Laterality: N/A;   CORONARY ANGIOGRAM  2010   CORONARY ARTERY BYPASS GRAFT N/A 11/29/2014   Procedure: CORONARY ARTERY BYPASS GRAFTING x 5 (LIMA-LAD, SVG-D, SVG-OM1-OM2, SVG-PD);  Surgeon: Melrose Nakayama, MD;  Location: [place];  Service: Open Heart Surgery;  Laterality: N/A;   CORONARY STENT INTERVENTION N/A 10/31/2016   Procedure: CORONARY STENT INTERVENTION;  Surgeon: Burnell Blanks, MD;  Location: [place] CV LAB;  Service: Cardiovascular;  Laterality: N/A;   ESOPHAGOGASTRODUODENOSCOPY (EGD) WITH PROPOFOL N/A 12/29/2017   Procedure: ESOPHAGOGASTRODUODENOSCOPY (EGD) WITH PROPOFOL;  Surgeon: [name], MD;  [facility];  Service: Endoscopy;  Laterality: N/A;   FRACTURE SURGERY     INSERT / REPLACE / REMOVE PACEMAKER  1999   INTRAMEDULLARY (IM) NAIL INTERTROCHANTERIC Left 04/04/2021   Procedure: INTRAMEDULLARY (IM) NAIL INTERTROCHANTRIC;  Surgeon: Vanetta Mulders, MD;  Location: [place];  Service: Orthopedics;  Laterality: Left;   KNEE ARTHROSCOPY Bilateral    LEFT HEART CATH AND CORS/GRAFTS ANGIOGRAPHY N/A 12/09/2017   Procedure: LEFT HEART CATH AND CORS/GRAFTS ANGIOGRAPHY;  Surgeon: Troy Sine, MD;  Location: [place] CV LAB;  Service: Cardiovascular;  Laterality: N/A;   LEFT HEART CATHETERIZATION WITH CORONARY ANGIOGRAM N/A 07/23/2012   Procedure: LEFT HEART CATHETERIZATION WITH CORONARY ANGIOGRAM;  Surgeon: Leonie Man, MD;  Location: [facility] C[facility] LAB;  Service: Cardiovascular;  Laterality: N/A;   LEXISCAN MYOVIEW  11/14/2011   Mild-moderate defect seen in Mid Inferolateral and Mid Anterolateral regions-consistant w/ infarct/scar. No significant  ischemia demonstrated.   POLYPECTOMY  12/29/2017   Procedure: POLYPECTOMY;  Surgeon: [name], MD;  Location: [facility];  Service: Endoscopy;;   PPM GENERATOR CHANGEOUT N/A 12/31/2020   Procedure: PPM GENERATOR CHANGEOUT;  Surgeon: Sanda Klein, MD;  Location: [place] CV LAB;  Service: Cardiovascular;  Laterality: N/A;   RIGHT/LEFT HEART CATH AND CORONARY ANGIOGRAPHY N/A 10/09/2016   Procedure: RIGHT/LEFT HEART CATH AND CORONARY ANGIOGRAPHY;  Surgeon: Jolaine Artist, MD;  Location: [place] CV LAB;  Service: Cardiovascular;  Laterality: N/A;   RIGHT/LEFT HEART CATH AND CORONARY/GRAFT ANGIOGRAPHY N/A 03/11/2019   Procedure: RIGHT/LEFT HEART CATH AND CORONARY/GRAFT ANGIOGRAPHY;  Surgeon: Jolaine Artist, MD;  Location: [place] [facility];  Service: Cardiovascular;  Laterality: N/A;   TEE WITHOUT CARDIOVERSION N/A 11/29/2014   Procedure: TRANSESOPHAGEAL ECHOCARDIOGRAM (TEE);  Surgeon: Melrose Nakayama, MD;  Location: [place];  Service: Open Heart Surgery;  Laterality: N/A;   TRANSCAROTID ARTERY REVASCULARIZATION  Right 10/01/2020   Procedure: RIGHT TRANSCAROTID ARTERY REVASCULARIZATION;  Surgeon: [name], MD;  Location: [facility] OR;  Service: Vascular;  Laterality: Right;   TRANSTHORACIC ECHOCARDIOGRAM  07/23/2012   EF 55-60%, normal-mild   TUBAL LIGATION     ULTRASOUND GUIDANCE FOR VASCULAR ACCESS Left 10/01/2020   Procedure: ULTRASOUND GUIDANCE FOR VASCULAR ACCESS;  Surgeon: [name], MD;  Location: [place];  Service: Vascular;  Laterality: Left;   Family History  Family History  Problem Relation Age of Onset   Breast cancer Mother    Diabetes Mother    Heart disease Maternal Grandmother    Kidney disease Maternal Grandmother    Diabetes Maternal Grandmother    Glaucoma Maternal Aunt    Heart disease Maternal Aunt    Asthma Sister    Colon cancer Neg Hx    Stomach cancer Neg Hx    Pancreatic cancer Neg Hx    Social History  reports that she quit smoking about 53 years ago. Her smoking use included cigarettes. She has a 0.75 pack-year smoking history. She has never used smokeless tobacco. She reports that she does not drink alcohol and does not use drugs. Allergies  Allergies  Allergen Reactions   Ivp Dye [Iodinated Contrast Media] Shortness Of Breath    No reaction to PO contrast with non-ionic dye.06-25-2014/rsm   Shellfish Allergy Anaphylaxis   Sulfa Antibiotics Shortness Of Breath   Iodine Hives   Atorvastatin Other (See Comments)    Pt states "causes bilateral leg pain/cramps."   Benadryl [Diphenhydramine] Other (See Comments)    "THIS DRIVES ME CRAZY AND MAKES ME FEEL LIKE I AM DYING" No problem last time they took   Colchicine Nausea And Vomiting   Contrast Media [Iodinated Contrast Media] Hives   Doxycycline Other (See Comments)    Unknown reaction, per patient   Uloric [Febuxostat] Other (See Comments)    Unknown reaction   Cephalexin Itching and Rash   Zithromax  [Azithromycin] Rash   Home medications Prior to Admission medications   Medication Sig Start Date End Date Taking? Authorizing Provider  acetaminophen (TYLENOL) 500 MG tablet Take 1,000 mg by mouth 2 (two) times daily as needed (for pain or headaches).   Yes [provider]  albuterol (PROVENTIL HFA;VENTOLIN HFA) 108 (90 Base) MCG/ACT inhaler Inhale 2 puffs into the lungs every 6 (six) hours as needed for wheezing or shortness of breath. 05/02/18  Yes Fuller Plan A, MD  allopurinol (ZYLOPRIM) 100 MG tablet Take 1 tablet (100 mg total) by  mouth daily. 03/14/21  Yes Croitoru, [name], MD  aspirin EC 81 MG tablet Take 1 tablet (81 mg total) by mouth daily. 03/27/16  Yes Kilroy, Luke K, PA-C  bisoprolol (ZEBETA) 5 MG tablet Take 1.5 tablets (7.5 mg total) by mouth daily. 03/18/21  Yes Milford, Maricela Bo, FNP  budesonide-formoterol (SYMBICORT) 160-4.5 MCG/ACT inhaler Inhale 2 puffs into the lungs 2 (two) times daily.   Yes [provider]  carboxymethylcellulose (REFRESH PLUS) 0.5 % SOLN Place 1 drop into both eyes every 12 (twelve) hours.   Yes [provider]  clopidogrel (PLAVIX) 75 MG tablet Take 1 tablet (75 mg total) by mouth daily. 02/28/21  Yes Croitoru, [name], MD  docusate sodium (COLACE) 100 MG capsule Take 100 mg by mouth at bedtime.   Yes [provider]  empagliflozin (JARDIANCE) 10 MG TABS tablet Take 10 mg by mouth daily before breakfast. 06/30/19  Yes Bensimhon, Shaune Pascal, MD  guaiFENesin (MUCINEX) 600 MG 12 hr tablet Take 1 tablet (600 mg total) by mouth 2 (two) times daily. Patient taking differently: Take 600 mg by mouth 2 (two) times daily as needed for cough. 01/10/21  Yes [name], NP  isosorbide mononitrate (IMDUR) 30 MG 24 hr tablet Take 90 mg by mouth daily.   Yes [provider]  loratadine (CLARITIN) 10 MG tablet Take 10 mg by mouth daily. 11/23/19  Yes [provider]  OXYGEN Inhale 3 L/min into the lungs continuous.    Yes  [provider]  oxymetazoline (AFRIN) 0.05 % nasal spray Place 1 spray into both nostrils 2 (two) times daily as needed for congestion.   Yes [provider]  pantoprazole (PROTONIX) 40 MG tablet Take 1 tablet (40 mg total) by mouth 2 (two) times daily. 07/08/19  Yes Nandigam, Venia Minks, MD  polyethylene glycol (MIRALAX / GLYCOLAX) 17 g packet Take 17 g by mouth daily.   Yes [provider]  RESTASIS 0.05 % ophthalmic emulsion Place 1 drop into both eyes 2 (two) times daily. 03/19/21  Yes [provider]  rosuvastatin (CRESTOR) 40 MG tablet Take 1 tablet (40 mg total) by mouth every evening. NEED OV. 09/19/20  Yes [name], Laxman, MD  torsemide (DEMADEX) 20 MG tablet Take 60 mg by mouth 2 (two) times daily.   Yes [provider]  ACCU-CHEK AVIVA PLUS test strip Check blood sugar twice daily 10/19/19   [provider]  Accu-Chek Softclix Lancets lancets Check blood sugar twice daily 08/10/19   [provider]  albuterol (PROVENTIL) (2.5 MG/3ML) 0.083% nebulizer solution Take 3 mLs (2.5 mg total) by nebulization every 6 (six) hours as needed for wheezing or shortness of breath. Patient not taking: Reported on 04/03/2021 05/02/18   Norval Morton, MD  Alcohol Swabs (B-D SINGLE USE SWABS REGULAR) PADS  08/10/19   [provider]  EPINEPHrine 0.3 mg/0.3 mL IJ SOAJ injection Inject 0.3 mg into the muscle once as needed for anaphylaxis (severe allergic reaction). Patient not taking: Reported on 04/01/2021    [provider]  nitroGLYCERIN (NITROSTAT) 0.4 MG SL tablet Place 1 tablet (0.4 mg total) under the tongue every 5 (five) minutes as needed for chest pain. Patient not taking: Reported on 04/01/2021 11/13/16 03/18/22  Erlene Quan, PA-C  potassium chloride SA (KLOR-CON M) 20 MEQ tablet Take 1 tablet (20 mEq total) by mouth every morning. Patient not taking: Reported on 04/01/2021 03/23/21   Croitoru, Dani Gobble, MD  torsemide 40 MG TABS  Take 80  mg by mouth 2 (two) times daily. Patient not taking: Reported on 04/03/2021 04/01/21 06/30/21  Croitoru, Dani Gobble, MD     Vitals:   04/06/21 0911 04/06/21 1121 04/06/21 1127 04/06/21 1227  BP:  94/72    Pulse:  79 82 72  Resp:  _0 Temp:  98.2 F (36.8 C)    TempSrc:  Oral    SpO2: 99% 100% 92% 96%  Weight:      Height:       Exam Gen alert, deconditioned, a bit lethargic No rash, cyanosis or gangrene Sclera anicteric, throat clear  Possibly +jvd Chest bibasilar fine rales, no wheezing RRR no RG Abd soft obese ntnd no mass or ascites +bs GU defer MS no joint effusions or deformity Ext 1+ LE/ UE edema Neuro Ox 3 , nf      Home meds include - zyloprim, asa, bisoprolol 75 qd, suymbicort, plavix, colace, jardiance, imdur, protonix, klorcon 20 qd, crestor, demadex 60 - 80 bid, prns/ vits/ supps      UA pend     UNa pend, UCr pend    Renal US showed echogenic kidneys w/o hydro (10.8/ 9.8 cm) w/ multiple cysts bilat and bilat cortical thinning         Aug - dec 2022 = 1.3- 1.8, eGFR 28- 44, CKD 3b       BP's range from low 50- 90's, to normal. Has had sig low BP's over the last 36 hrs here   CXR 2/22 mild vasc congestion         I/O here = 2.2 L in and 2000 out = +226 cc     Na 134  K 4.5 CO2 28  BUN 52  cr 3.80  Hb 7.0  plt 116     Last alb 2.9   Assessment/ Plan: AKI on CKD 3b - b/l creat 1.3- 1.8, eGFR 28- 44, CKD 3b from aug - dec 2022. Creat here 1.6 on admission 2/22 in setting of a fall w/ hip fracture and surgery. Has had transient hypotension here. Now creat up to 3.80 today. Got 1 dose IV vanc on 2/23, no other nephrotoxins. Renal US shows no obstruction and echogenic kidneys. UA is pending. UOP is good. AKI possibly due to hypotension/ hypoperfusion. Up 4kg but no CHF by repeat CXR tonight.  UOP is good. Would continue IVF"s and will start midodrine for soft BP's, will place foley cath and get UA and urine lytes. No indication for RRT at this time. Will  follow.  L hip fracture - sp nailing L hip 2/23 Anemia - d/t blood loss, Hb dropping.  HTN - bp's soft to normal. Home BB, imdur and demadex holding CAD sp CABG SP PPM/ ICD DM2   Rob Antonios Ostrow  MD 04/06/2021, 7:09 PM Recent Labs  Lab 04/03/21 1000 04/04/21 0226 04/05/21 0438 04/06/21 0457  HGB 10.8*   < > 9.0* 7.0*  ALBUMIN 2.9*  --   --   --   CALCIUM 8.7*   < > 8.6* 8.0*  CREATININE 1.69*   < > 2.73* 3.80*  K 3.4*   < > 4.6 4.5   < > = values in this interval not displayed.

## 2021-04-07 DIAGNOSIS — S72002A Fracture of unspecified part of neck of left femur, initial encounter for closed fracture: Secondary | ICD-10-CM | POA: Diagnosis not present

## 2021-04-07 LAB — CBC WITH DIFFERENTIAL/PLATELET
Abs Immature Granulocytes: 0.05 10*3/uL (ref 0.00–0.07)
Basophils Absolute: 0 10*3/uL (ref 0.0–0.1)
Basophils Relative: 0 %
Eosinophils Absolute: 0.1 10*3/uL (ref 0.0–0.5)
Eosinophils Relative: 1 %
HCT: 24.2 % — ABNORMAL LOW (ref 36.0–46.0)
Hemoglobin: 7.1 g/dL — ABNORMAL LOW (ref 12.0–15.0)
Immature Granulocytes: 1 %
Lymphocytes Relative: 8 %
Lymphs Abs: 0.6 10*3/uL — ABNORMAL LOW (ref 0.7–4.0)
MCH: 28.9 pg (ref 26.0–34.0)
MCHC: 29.3 g/dL — ABNORMAL LOW (ref 30.0–36.0)
MCV: 98.4 fL (ref 80.0–100.0)
Monocytes Absolute: 0.6 10*3/uL (ref 0.1–1.0)
Monocytes Relative: 9 %
Neutro Abs: 5.7 10*3/uL (ref 1.7–7.7)
Neutrophils Relative %: 81 %
Platelets: 126 10*3/uL — ABNORMAL LOW (ref 150–400)
RBC: 2.46 MIL/uL — ABNORMAL LOW (ref 3.87–5.11)
RDW: 15 % (ref 11.5–15.5)
WBC: 7 10*3/uL (ref 4.0–10.5)
nRBC: 0.6 % — ABNORMAL HIGH (ref 0.0–0.2)

## 2021-04-07 LAB — PREPARE RBC (CROSSMATCH)

## 2021-04-07 LAB — BASIC METABOLIC PANEL
Anion gap: 11 (ref 5–15)
BUN: 64 mg/dL — ABNORMAL HIGH (ref 8–23)
CO2: 26 mmol/L (ref 22–32)
Calcium: 7.9 mg/dL — ABNORMAL LOW (ref 8.9–10.3)
Chloride: 97 mmol/L — ABNORMAL LOW (ref 98–111)
Creatinine, Ser: 4.46 mg/dL — ABNORMAL HIGH (ref 0.44–1.00)
GFR, Estimated: 10 mL/min — ABNORMAL LOW (ref >60)
Glucose, Bld: 132 mg/dL — ABNORMAL HIGH (ref 70–99)
Potassium: 5 mmol/L (ref 3.5–5.1)
Sodium: 134 mmol/L — ABNORMAL LOW (ref 135–145)

## 2021-04-07 LAB — GLUCOSE, CAPILLARY
Glucose-Capillary: 119 mg/dL — ABNORMAL HIGH (ref 70–99)
Glucose-Capillary: 136 mg/dL — ABNORMAL HIGH (ref 70–99)
Glucose-Capillary: 121 mg/dL — ABNORMAL HIGH (ref 70–99)
Glucose-Capillary: 147 mg/dL — ABNORMAL HIGH (ref 70–99)

## 2021-04-07 MED ORDER — FUROSEMIDE 10 MG/ML IJ SOLN
80.0000 mg | Freq: Three times a day (TID) | INTRAMUSCULAR | Status: DC
  Administered 2021-04-07 – 2021-04-08 (×4): 80 mg via INTRAVENOUS
  Filled 2021-04-07 (×4): qty 8

## 2021-04-07 MED ORDER — FUROSEMIDE 10 MG/ML IJ SOLN: 20.0000 mg | Freq: Once | INTRAMUSCULAR | Status: DC

## 2021-04-07 MED ORDER — SODIUM CHLORIDE 0.9% IV SOLUTION: Freq: Once | INTRAVENOUS | Status: AC

## 2021-04-07 MED ORDER — ROSUVASTATIN CALCIUM 5 MG PO TABS
5.0000 mg | ORAL_TABLET | Freq: Every evening | ORAL | Status: DC
  Administered 2021-04-07 – 2021-04-27 (×16): 5 mg via ORAL
  Filled 2021-04-07 (×18): qty 1

## 2021-04-07 NOTE — ED Provider Notes (Signed)
[facility] Provider Note   CSN: [id number] Arrival date & time: 04/03/21  8676     History  Chief Complaint  Patient presents with   [name]    DEA BITTING is a 76 y.o. female.  Patient presents ER chief complaint left lower extremity pain.  She states that she tripped over her oxygen cord and fell.  Denies loss of consciousness or pain elsewhere.  Had a hard time getting up and EMS was called to bring her to the ER.  Denies headache or chest pain or loss of consciousness denies neck pain or back pain.      Home Medications Prior to Admission medications   Medication Sig Start Date End Date Taking? Authorizing Provider  acetaminophen (TYLENOL) 500 MG tablet Take 1,000 mg by mouth 2 (two) times daily as needed (for pain or headaches).   Yes [provider]  albuterol (PROVENTIL HFA;VENTOLIN HFA) 108 (90 Base) MCG/ACT inhaler Inhale 2 puffs into the lungs every 6 (six) hours as needed for wheezing or shortness of breath. 05/02/18  Yes Fuller Plan A, MD  allopurinol (ZYLOPRIM) 100 MG tablet Take 1 tablet (100 mg total) by mouth daily. 03/14/21  Yes Croitoru, [name], MD  aspirin EC 81 MG tablet Take 1 tablet (81 mg total) by mouth daily. 03/27/16  Yes Kilroy, Luke K, PA-C  bisoprolol (ZEBETA) 5 MG tablet Take 1.5 tablets (7.5 mg total) by mouth daily. 03/18/21  Yes Milford, Maricela Bo, FNP  budesonide-formoterol (SYMBICORT) 160-4.5 MCG/ACT inhaler Inhale 2 puffs into the lungs 2 (two) times daily.   Yes [provider]  carboxymethylcellulose (REFRESH PLUS) 0.5 % SOLN Place 1 drop into both eyes every 12 (twelve) hours.   Yes [provider]  clopidogrel (PLAVIX) 75 MG tablet Take 1 tablet (75 mg total) by mouth daily. 02/28/21  Yes Croitoru, [name], MD  docusate sodium (COLACE) 100 MG capsule Take 100 mg by mouth at bedtime.   Yes [provider]  empagliflozin (JARDIANCE) 10 MG TABS tablet Take 10 mg by mouth daily  before breakfast. 06/30/19  Yes Bensimhon, Shaune Pascal, MD  guaiFENesin (MUCINEX) 600 MG 12 hr tablet Take 1 tablet (600 mg total) by mouth 2 (two) times daily. Patient taking differently: Take 600 mg by mouth 2 (two) times daily as needed for cough. 01/10/21  Yes [name], NP  isosorbide mononitrate (IMDUR) 30 MG 24 hr tablet Take 90 mg by mouth daily.   Yes [provider]  loratadine (CLARITIN) 10 MG tablet Take 10 mg by mouth daily. 11/23/19  Yes [provider]  OXYGEN Inhale 3 L/min into the lungs continuous.    Yes [provider]  oxymetazoline (AFRIN) 0.05 % nasal spray Place 1 spray into both nostrils 2 (two) times daily as needed for congestion.   Yes [provider]  pantoprazole (PROTONIX) 40 MG tablet Take 1 tablet (40 mg total) by mouth 2 (two) times daily. 07/08/19  Yes Nandigam, Venia Minks, MD  polyethylene glycol (MIRALAX / GLYCOLAX) 17 g packet Take 17 g by mouth daily.   Yes [provider]  RESTASIS 0.05 % ophthalmic emulsion Place 1 drop into both eyes 2 (two) times daily. 03/19/21  Yes [provider]  rosuvastatin (CRESTOR) 40 MG tablet Take 1 tablet (40 mg total) by mouth every evening. NEED OV. 09/19/20  Yes [name], Laxman, MD  torsemide (DEMADEX) 20 MG tablet Take 60 mg by mouth 2 (two) times daily.  Yes [provider]  ACCU-CHEK AVIVA PLUS test strip Check blood sugar twice daily 10/19/19   [provider]  Accu-Chek Softclix Lancets lancets Check blood sugar twice daily 08/10/19   [provider]  albuterol (PROVENTIL) (2.5 MG/3ML) 0.083% nebulizer solution Take 3 mLs (2.5 mg total) by nebulization every 6 (six) hours as needed for wheezing or shortness of breath. Patient not taking: Reported on 04/03/2021 05/02/18   Norval Morton, MD  Alcohol Swabs (B-D SINGLE USE SWABS REGULAR) PADS  08/10/19   [provider]  EPINEPHrine 0.3 mg/0.3 mL IJ SOAJ injection Inject 0.3 mg into the muscle  once as needed for anaphylaxis (severe allergic reaction). Patient not taking: Reported on 04/01/2021    [provider]  nitroGLYCERIN (NITROSTAT) 0.4 MG SL tablet Place 1 tablet (0.4 mg total) under the tongue every 5 (five) minutes as needed for chest pain. Patient not taking: Reported on 04/01/2021 11/13/16 03/18/22  Erlene Quan, PA-C  potassium chloride SA (KLOR-CON M) 20 MEQ tablet Take 1 tablet (20 mEq total) by mouth every morning. Patient not taking: Reported on 04/01/2021 03/23/21   Croitoru, Dani Gobble, MD  torsemide 40 MG TABS Take 80 mg by mouth 2 (two) times daily. Patient not taking: Reported on 04/03/2021 04/01/21 06/30/21  Croitoru, Dani Gobble, MD      Allergies    Ivp dye [iodinated contrast media], Shellfish allergy, Sulfa antibiotics, Iodine, Atorvastatin, Benadryl [diphenhydramine], Colchicine, Contrast media [iodinated contrast media], Doxycycline, Uloric [febuxostat], Cephalexin, and Zithromax [azithromycin]    Review of Systems   Review of Systems  Constitutional:  Negative for fever.  HENT:  Negative for ear pain.   Eyes:  Negative for pain.  Respiratory:  Negative for cough.   Cardiovascular:  Negative for chest pain.  Gastrointestinal:  Negative for abdominal pain.  Genitourinary:  Negative for flank pain.  Musculoskeletal:  Negative for back pain.  Skin:  Negative for rash.  Neurological:  Negative for headaches.   Physical Exam Updated Vital Signs BP 123/82 (BP Location: Left Arm)    Pulse 89    Temp 97.8 F (36.6 C) (Oral)    Resp 20    Ht 5\' 3"  (1.6 m)    Wt 108.7 kg    SpO2 95%    BMI 42.45 kg/m  Physical Exam Constitutional:      General: She is not in acute distress.    Appearance: Normal appearance.  HENT:     Head: Normocephalic.     Nose: Nose normal.  Eyes:     Extraocular Movements: Extraocular movements intact.  Cardiovascular:     Rate and Rhythm: Normal rate.  Pulmonary:     Effort: Pulmonary effort is normal.  Musculoskeletal:      Cervical back: Normal range of motion.     Comments: Pain with attempted range of motion of the left hip.  Neurovascular intact otherwise.  Compartments are soft.  Neurological:     General: No focal deficit present.     Mental Status: She is alert. Mental status is at baseline.    ED Results / Procedures / Treatments   Labs (all labs ordered are listed, but only abnormal results are displayed) Labs Reviewed  CBC WITH DIFFERENTIAL/PLATELET - Abnormal; Notable for the following components:      Result Value   RBC 3.75 (*)    Hemoglobin 10.8 (*)    MCHC 29.0 (*)    Platelets 139 (*)    All other components within normal limits  COMPREHENSIVE METABOLIC PANEL - Abnormal; Notable for the following components:   Potassium 3.4 (*)    Chloride 97 (*)    CO2 33 (*)    Glucose, Bld 150 (*)    Creatinine, Ser 1.69 (*)    Calcium 8.7 (*)    Total Protein 5.8 (*)    Albumin 2.9 (*)    GFR, Estimated 31 (*)    All other components within normal limits  BRAIN NATRIURETIC PEPTIDE - Abnormal; Notable for the following components:   B Natriuretic Peptide 333.8 (*)    All other components within normal limits  CBC - Abnormal; Notable for the following components:   RBC 3.45 (*)    Hemoglobin 10.0 (*)    HCT 34.1 (*)    MCHC 29.3 (*)    All other components within normal limits  BASIC METABOLIC PANEL - Abnormal; Notable for the following components:   CO2 34 (*)    Glucose, Bld 138 (*)    Creatinine, Ser 1.75 (*)    Calcium 8.7 (*)    GFR, Estimated 30 (*)    All other components within normal limits  HEMOGLOBIN A1C - Abnormal; Notable for the following components:   Hgb A1c MFr Bld 6.3 (*)    All other components within normal limits  GLUCOSE, CAPILLARY - Abnormal; Notable for the following components:   Glucose-Capillary 134 (*)    All other components within normal limits  GLUCOSE, CAPILLARY - Abnormal; Notable for the following components:   Glucose-Capillary 115 (*)    All other  components within normal limits  GLUCOSE, CAPILLARY - Abnormal; Notable for the following components:   Glucose-Capillary 116 (*)    All other components within normal limits  GLUCOSE, CAPILLARY - Abnormal; Notable for the following components:   Glucose-Capillary 130 (*)    All other components within normal limits  CBC WITH DIFFERENTIAL/PLATELET - Abnormal; Notable for the following components:   RBC 3.11 (*)    Hemoglobin 9.0 (*)    HCT 31.4 (*)    MCV 101.0 (*)    MCHC 28.7 (*)    Platelets 140 (*)    Neutro Abs 8.6 (*)    All other components within normal limits  BASIC METABOLIC PANEL - Abnormal; Notable for the following components:   Glucose, Bld 182 (*)    BUN 35 (*)    Creatinine, Ser 2.73 (*)    Calcium 8.6 (*)    GFR, Estimated 17 (*)    All other components within normal limits  GLUCOSE, CAPILLARY - Abnormal; Notable for the following components:   Glucose-Capillary 149 (*)    All other components within normal limits  GLUCOSE, CAPILLARY - Abnormal; Notable for the following components:   Glucose-Capillary 171 (*)    All other components within normal limits  GLUCOSE, CAPILLARY - Abnormal; Notable for the following components:   Glucose-Capillary 166 (*)    All other components within normal limits  GLUCOSE, CAPILLARY - Abnormal; Notable for the following components:   Glucose-Capillary 175 (*)    All other components within normal limits  GLUCOSE, CAPILLARY - Abnormal; Notable for the following components:   Glucose-Capillary 123 (*)    All other components within normal limits  CBC WITH DIFFERENTIAL/PLATELET - Abnormal; Notable for the following components:   RBC 2.38 (*)    Hemoglobin 7.0 (*)    HCT 23.4 (*)    MCHC 29.9 (*)    Platelets 116 (*)    nRBC 0.3 (*)  All other components within normal limits  BASIC METABOLIC PANEL - Abnormal; Notable for the following components:   Sodium 134 (*)    Chloride 97 (*)    Glucose, Bld 150 (*)    BUN 52 (*)     Creatinine, Ser 3.80 (*)    Calcium 8.0 (*)    GFR, Estimated 12 (*)    All other components within normal limits  GLUCOSE, CAPILLARY - Abnormal; Notable for the following components:   Glucose-Capillary 140 (*)    All other components within normal limits  GLUCOSE, CAPILLARY - Abnormal; Notable for the following components:   Glucose-Capillary 132 (*)    All other components within normal limits  URINALYSIS, ROUTINE W REFLEX MICROSCOPIC - Abnormal; Notable for the following components:   APPearance CLOUDY (*)    Glucose, UA 150 (*)    Hgb urine dipstick LARGE (*)    Protein, ur 100 (*)    Bacteria, UA RARE (*)    All other components within normal limits  GLUCOSE, CAPILLARY - Abnormal; Notable for the following components:   Glucose-Capillary 112 (*)    All other components within normal limits  GLUCOSE, CAPILLARY - Abnormal; Notable for the following components:   Glucose-Capillary 162 (*)    All other components within normal limits  GLUCOSE, CAPILLARY - Abnormal; Notable for the following components:   Glucose-Capillary 162 (*)    All other components within normal limits  CBC WITH DIFFERENTIAL/PLATELET - Abnormal; Notable for the following components:   RBC 2.46 (*)    Hemoglobin 7.1 (*)    HCT 24.2 (*)    MCHC 29.3 (*)    Platelets 126 (*)    nRBC 0.6 (*)    Lymphs Abs 0.6 (*)    All other components within normal limits  BASIC METABOLIC PANEL - Abnormal; Notable for the following components:   Sodium 134 (*)    Chloride 97 (*)    Glucose, Bld 132 (*)    BUN 64 (*)    Creatinine, Ser 4.46 (*)    Calcium 7.9 (*)    GFR, Estimated 10 (*)    All other components within normal limits  GLUCOSE, CAPILLARY - Abnormal; Notable for the following components:   Glucose-Capillary 148 (*)    All other components within normal limits  GLUCOSE, CAPILLARY - Abnormal; Notable for the following components:   Glucose-Capillary 135 (*)    All other components within normal  limits  GLUCOSE, CAPILLARY - Abnormal; Notable for the following components:   Glucose-Capillary 119 (*)    All other components within normal limits  GLUCOSE, CAPILLARY - Abnormal; Notable for the following components:   Glucose-Capillary 136 (*)    All other components within normal limits  RESP PANEL BY RT-PCR (FLU A&B, COVID) [id number]  SURGICAL PCR SCREEN  CREATININE, URINE, RANDOM  SODIUM, URINE, RANDOM  TYPE AND SCREEN  PREPARE RBC (CROSSMATCH)    EKG None  Radiology US RENAL  Result Date: 04/06/2021 CLINICAL DATA:  Acute kidney injury. EXAM: RENAL / URINARY TRACT ULTRASOUND COMPLETE COMPARISON:  01/25/2018 and 11/20/2017 FINDINGS: Right Kidney: Renal measurements: 10.8 x 5.9 x 6.4 centimeters = volume: 214.2 mL. Renal parenchyma is echogenic. There is renal parenchymal thinning. Multiple cysts measure 1 centimeter or less. No solid mass identified. No hydronephrosis. Left Kidney: Renal measurements: 9.8 x 4.9 x 5.4 centimeters = volume: 135.9 mL. Increased renal echogenicity. There is renal parenchymal thinning. Numerous cysts measure up to 1.4 centimeters. No solid mass identified.  No hydronephrosis. Bladder: Appears normal for degree of bladder distention. Other: None. IMPRESSION: 1. Bilateral renal parenchymal thinning and renal echogenicity. 2. Small bilateral renal cysts. 3. No suspicious renal mass or hydronephrosis. Electronically Signed   By: Nolon Nations M.D.   On: 04/06/2021 18:34   DG CHEST PORT 1 VIEW  Result Date: 04/06/2021 CLINICAL DATA:  Shortness of breath. EXAM: PORTABLE CHEST 1 VIEW COMPARISON:  Chest radiograph dated 04/03/2021. FINDINGS: Shallow inspiration. Bibasilar streaky atelectasis or infiltrate. Right perihilar density as seen on the prior radiograph. No new consolidation. Small left pleural effusion may be present. No pneumothorax. Stable cardiac silhouette. Atherosclerotic calcification of the aorta. Median sternotomy wires and right sided AICD device.  No acute osseous pathology. Osteopenia with degenerative changes of the spine and shoulders. IMPRESSION: 1. Bibasilar streaky atelectasis or infiltrate. 2. Right perihilar density as seen on the prior radiograph. Electronically Signed   By: Anner Crete M.D.   On: 04/06/2021 19:59    Procedures Procedures    Medications Ordered in ED Medications  HYDROcodone-acetaminophen (NORCO/VICODIN) 5-325 MG per tablet 1-2 tablet (1 tablet Oral Given 04/07/21 0218)  senna (SENOKOT) tablet 8.6 mg (8.6 mg Oral Given 04/07/21 1021)  acetaminophen (TYLENOL) tablet 650 mg (650 mg Oral Given 04/07/21 1016)  allopurinol (ZYLOPRIM) tablet 100 mg (100 mg Oral Given 04/07/21 1020)  aspirin EC tablet 81 mg (81 mg Oral Given 04/07/21 1016)  rosuvastatin (CRESTOR) tablet 40 mg (40 mg Oral Given 04/06/21 1740)  midazolam (VERSED) 2 MG/2ML injection (has no administration in time range)  pantoprazole (PROTONIX) EC tablet 40 mg (40 mg Oral Given 04/07/21 1021)  polyethylene glycol (MIRALAX / GLYCOLAX) packet 17 g (17 g Oral Given 04/07/21 1020)  clopidogrel (PLAVIX) tablet 75 mg (75 mg Oral Given 04/07/21 1023)  loratadine (CLARITIN) tablet 10 mg (10 mg Oral Given 04/07/21 1021)  polyvinyl alcohol (LIQUIFILM TEARS) 1.4 % ophthalmic solution 1 drop (1 drop Both Eyes Given 04/07/21 1024)  cycloSPORINE (RESTASIS) 0.05 % ophthalmic emulsion 1 drop (1 drop Both Eyes Given 04/07/21 1026)  mometasone-formoterol (DULERA) 200-5 MCG/ACT inhaler 2 puff (2 puffs Inhalation Given 04/07/21 0751)  guaiFENesin (MUCINEX) 12 hr tablet 600 mg (600 mg Oral Given 04/06/21 2040)  insulin aspart (novoLOG) injection 0-9 Units (1 Units Subcutaneous Given 04/07/21 1255)  ondansetron (ZOFRAN) injection 4 mg ( Intravenous MAR Unhold 04/04/21 1717)  fentaNYL (SUBLIMAZE) 100 MCG/2ML injection (has no administration in time range)  midodrine (PROAMATINE) tablet 10 mg ( Oral Canceled Entry 04/07/21 1021)  Chlorhexidine Gluconate Cloth 2 % PADS 6 each (6 each  Topical Given 04/07/21 1100)  furosemide (LASIX) injection 80 mg (80 mg Intravenous Given 04/07/21 1022)  0.9 %  sodium chloride infusion (Manually program via Guardrails IV Fluids) (has no administration in time range)  sodium chloride 0.9 % bolus 500 mL (0 mLs Intravenous Stopped 04/03/21 1123)  morphine (PF) 2 MG/ML injection 2 mg (2 mg Intravenous Given 04/03/21 0959)  ondansetron (ZOFRAN) injection 4 mg (4 mg Intravenous Given 04/03/21 0958)  vancomycin (VANCOREADY) IVPB 1500 mg/300 mL (1,500 mg Intravenous New Bag/Given 04/04/21 1329)  morphine (PF) 2 MG/ML injection 2 mg (2 mg Intravenous Given 04/03/21 1441)  potassium chloride SA (KLOR-CON M) CR tablet 40 mEq (40 mEq Oral Given 04/03/21 1756)  fentaNYL (SUBLIMAZE) injection 100 mcg (100 mcg Intravenous Given 04/03/21 1558)  chlorhexidine (HIBICLENS) 4 % liquid 4 application (4 application Topical Given 04/04/21 0608)  tranexamic acid (CYKLOKAPRON) IVPB 1,000 mg (1,000 mg Intravenous Given 04/04/21 1355)  chlorhexidine (  PERIDEX) 0.12 % solution 15 mL (15 mLs Mouth/Throat Given 04/04/21 1330)    Or  MEDLINE mouth rinse ( Mouth Rinse See Alternative 04/04/21 1330)    ED Course/ Medical Decision Making/ A&P                           Medical Decision Making Amount and/or Complexity of Data Reviewed Labs: ordered. Radiology: ordered.  Risk Prescription drug management. Decision regarding hospitalization.   Patient on telemetry with normal sinus rhythm.  No ST elevations or depressions noted.  Rate is normal.  Work-up includes labs which indicate persistent AKI.[name]  X-rays concerning for left intertrochanteric fracture.  Consultation with orthopedic surgery who came and saw the patient.  Medical consultation for admission.        Final Clinical Impression(s) / ED Diagnoses Final diagnoses:  Knee pain, left  Elective surgery  AKI (acute kidney injury) ([name])  SOB (shortness of breath)    Rx / DC Orders ED Discharge Orders      None         Luna Fuse, MD 04/07/21 1326

## 2021-04-07 NOTE — Progress Notes (Addendum)
[facility] Progress Note  Subjective:seen in room, UOP only 100 cc yesterday. Feeling "bad", swollen, ^SOB. Creat up today 4.4.   Vitals:   04/06/21 2014 04/07/21 0220 04/07/21 0521 04/07/21 0751  BP: 118/62  123/82   Pulse: 84  89   Resp: (!) 23  20   Temp: 98.6 F (37 C)  97.8 F (36.6 C)   TempSrc: Oral  Oral   SpO2: 99%  97% 95%  Weight:  108.7 kg    Height:        Exam: Gen alert, deconditioned, no distress +jvd Chest bibasilar fine rales, no wheezing RRR no RG Abd soft obese ntnd no mass or ascites +bs MS no joint effusions or deformity Ext 1-2+ LE/ UE edema Neuro Ox 3 , nf      Home meds include - zyloprim, asa, bisoprolol 75 qd, suymbicort, plavix, colace, jardiance, imdur, protonix, klorcon 20 qd, crestor, demadex 60 - 80 bid, prns/ vits/ supps       UA 2/25 cloudy, large Hb, 100 prot, 0-5 rbd, 6-10 wbc     Una 14,  UCr 63    Renal US showed echogenic kidneys w/o hydro (10.8/ 9.8 cm) w/ multiple cysts bilat and bilat cortical thinning      Aug - dec 2022 = 1.3- 1.8, eGFR 28- 44, CKD 3b       CXR 2/22 mild vasc congestion         alb 2.9     Assessment/ Plan: AKI on CKD 3b - b/l creat 1.3- 1.8, eGFR 28- 44, CKD 3b from aug - dec 2022. Creat here 1.6 on admission 2/22 in setting of a fall w/ hip fracture and surgery. Has had low BP's post op. Cret has worsened up to 3.8 yest on 2/25. Renal US showed no obstruction and echogenic kidneys. UA unremarkable. Suspect AKI due to hypotension/ hypoperfusion, possible ATN. Started midodrine. UOP worse overnight (100 cc yest) despite foley and IVF's. Creat ^ again today to 4.4. IVF's were dc'd. Pt more edematous. Starting IV lasix 80 tid. She is at risk for needing HD in next 1-2 days. Have d/w pt. Will follow closely.  L hip fracture - sp nailing L hip 2/23 Anemia - d/t blood loss. Hb 10.8 pre op, down to low 7's now.  HTN - bp's soft to low-normal . Holding home BB, imdur and demadex CAD sp CABG SP PPM/  ICD DM2 Deconditioning       Rob Alvia Jablonski 04/07/2021, 2:09 PM   Recent Labs  Lab 04/03/21 1000 04/04/21 0226 04/06/21 0457 04/07/21 0114  K 3.4*   < > 4.5 5.0  BUN 18   < > 52* 64*  CREATININE 1.69*   < > 3.80* 4.46*  ALBUMIN 2.9*  --   --   --   CALCIUM 8.7*   < > 8.0* 7.9*  HGB 10.8*   < > 7.0* 7.1*   < > = values in this interval not displayed.   Inpatient medications:  sodium chloride   Intravenous Once   allopurinol  100 mg Oral Daily   aspirin EC  81 mg Oral Daily   Chlorhexidine Gluconate Cloth  6 each Topical Daily   clopidogrel  75 mg Oral Daily   cycloSPORINE  1 drop Both Eyes BID   furosemide  80 mg Intravenous Q8H   insulin aspart  0-9 Units Subcutaneous TID WC   loratadine  10 mg Oral Daily   midodrine  10 mg Oral  TID WC   mometasone-formoterol  2 puff Inhalation BID   pantoprazole  40 mg Oral BID   polyethylene glycol  17 g Oral Daily   polyvinyl alcohol  1 drop Both Eyes Q12H   rosuvastatin  40 mg Oral QPM   senna  1 tablet Oral BID    acetaminophen, guaiFENesin, HYDROcodone-acetaminophen, ondansetron (ZOFRAN) IV

## 2021-04-07 NOTE — Progress Notes (Signed)
PROGRESS NOTE  [name]  DOB: 1946/01/30  PCP: Alicia Amel, MD FTD:[id number]  DOA: 04/03/2021  LOS: 4 days  Hospital Day: 5  Brief narrative: [name] is a 76 y.o. female with PMH significant for DM2, HTN, morbid obesity, OSA, OHS chronically on 2 L oxygen at home, CAD s/p CABG '16 (LIMA to LAD, SVG to D1, SVG to OM1 and OM2, SVG to PDA), HFpEF, complete heart block s/p PPM/ICD, CKD stage III, adrenal insufficiency. Patient presented to the ED on 04/03/2021 after a fall at home.  She lives at home with her son, ambulates inside the home without a walker but uses walker to walk.   On 2/22, patient was up and felt her left leg suddenly gave out and she fell on the floor landing on her left side and started immediately hurting in the left hip and leg.  Denies any premonition symptoms like chest pain, shortness of breath, palpitation, dizziness, nausea, vomiting.  Did not pass out or hit her head. EMS brought her to the ED.  In the ED, patient was hemodynamically stable. Labs with hemoglobin of 10.8, serum bicarb 33, BUN/creatinine 18/1.69, BNP 334. Chest x-ray with cardiomegaly and mild pulmonary vascular congestion. X-ray noted a displaced intertrochanteric left hip fracture with left superior pubic ramus fracture Admitted to hospitalist service Orthopedics consulted.  Subjective: Patient was seen and examined this morning. Feels tired, lousy.  Goes back to sleep.  Mostly sleepy At the time of my evaluation, patient's sister [name] was on the phone with the nurse.  I also had a conversation with her. Renal function patient seems to be making urine.  Creatinine further up however  Principal Problem:   Closed left hip fracture ([place]) secondary to fall at home Active Problems:   ANEMIA   HTN (hypertension), benign   Mixed hypercholesterolemia and hypertriglyceridemia   S/P CABG x 5   Chronic diastolic CHF (congestive heart failure) (HCC)   CHB (complete heart block)  (HCC)   Diabetes mellitus without complication (HCC)   Chronic respiratory failure (HCC)   Thrombocytopenia (HCC)   Closed fracture of left superior pubic ramus (HCC)   Chronic kidney disease, stage III B (moderate) ([place])   Fall at home, initial encounter   Hypokalemia    Assessment and Plan: AKI on CKD 3B -Baseline creatinine less than 1.7.   -Postoperatively, patient was hypotensive for few hours.   -Her creatinine has been trending up since surgery.   -I held her diuretics before the surgery.  Postsurgically, she has remained on IV hydration.  Despite that, creatinine continues to worsen.  Nephrology consultation obtained.   -Creatinine further up by 4.46 today but the rate of rise is not as steep as before.  I noted that nephrology stopped the fluid and start the patient on Lasix IV 80 mg 3 times daily. Recent Labs    01/09/21 0340 01/10/21 0039 01/21/21 1221 02/14/21 0112 03/18/21 1127 04/03/21 1000 04/04/21 0226 04/05/21 0438 04/06/21 0457 04/07/21 0114  BUN 20 26* 30* 24* 22 18 18  35* 52* 64*  CREATININE 1.52* 1.76* 1.84* 1.58* 1.55* 1.69* 1.75* 2.73* 3.80* 4.46*   Closed left hip fracture secondary to fall at home Left superior pubic ramus fracture -Patient had a mechanical fall at home impacting the left hip sustaining closed left hip fracture as well as the left superior pubic ramus fracture.   -2/23, patient underwent cephalomedullary nailing of left hip. -Pain control and DVT prophylaxis per orthopedics  Chronic diastolic CHF  Hypertension -Last echocardiogram noted EF of 55 to 60% with grade 1 diastolic dysfunction in 07/8125.  Right ventricular size and systolic function normal. -Home meds include beta-blocker, Imdur, torsemide. -Currently all her meds are on hold.  -If blood pressure remains stable on IV Lasix, I will resume beta-blocker tomorrow.  CAD S/P CABG x 5 HLD -Last cardiac catheter was in 02/2019 where all territories were noted to have adequate  coronary flow via bypasses or native vessels with stents. -Continue aspirin, Plavix, beta-blocker, isosorbide mononitrate and Crestor   CHB (complete heart block)    S/p PPM/ICD    Type 2 diabetes mellitus -A1c 6.5 in November 2022 -Home meds include Jardiance 10 mg daily -Currently on hold.  Continue sliding scale insulin. -Blood sugar level remain controlled Recent Labs  Lab 04/06/21 1537 04/06/21 1757 04/06/21 2234 04/07/21 0732 04/07/21 1200  GLUCAP 162* 148* 135* 119* 136*   Acute blood loss anemia  Chronic mild macrocytic anemia -Baseline hemoglobin more than 10.   -Postoperatively, hemoglobin running low.  Significantly down at 7 yesterday and 7.1 today.  I noted that orthopedist Dr. Louanne [name] ordered 2 units of PRBC today. Recent Labs    04/03/21 1000 04/04/21 0226 04/05/21 0438 04/06/21 0457 04/07/21 0114  HGB 10.8* 10.0* 9.0* 7.0* 7.1*  MCV 99.2 98.8 101.0* 98.3 98.4   Morbid obesity  -Body mass index is 42.45 kg/m. Patient has been advised to make an attempt to improve diet and exercise patterns to aid in weight loss.  Chronic respiratory failure  OSA/OHS -Continue nasal cannula oxygen 3 L  Mobility: Needs PT eval postprocedure Goals of care   Code Status: Full Code    Nutritional status:  Body mass index is 42.45 kg/m.      Diet:  Diet Order             Diet regular Room service appropriate? Yes; Fluid consistency: Thin  Diet effective now                   DVT prophylaxis:     Antimicrobials: None Fluid: None Consultants: Orthopedics Family Communication: None at bedside.  Called and updated patient's son Mr. [name] yesterday and patient's sister [name] today.  Status is: Inpatient  Continue in-hospital care because: AKI postprocedure, not improving yet Level of care: Telemetry Surgical   Dispo: The patient is from: Home              Anticipated d/c is to: PT eval obtained.  SNF recommended              Patient  currently is not medically stable to d/c.   Difficult to place patient No     Infusions:     Scheduled Meds:  sodium chloride   Intravenous Once   allopurinol  100 mg Oral Daily   aspirin EC  81 mg Oral Daily   Chlorhexidine Gluconate Cloth  6 each Topical Daily   clopidogrel  75 mg Oral Daily   cycloSPORINE  1 drop Both Eyes BID   furosemide  80 mg Intravenous Q8H   insulin aspart  0-9 Units Subcutaneous TID WC   loratadine  10 mg Oral Daily   midodrine  10 mg Oral TID WC   mometasone-formoterol  2 puff Inhalation BID   pantoprazole  40 mg Oral BID   polyethylene glycol  17 g Oral Daily   polyvinyl alcohol  1 drop Both Eyes Q12H   rosuvastatin  40 mg Oral QPM  senna  1 tablet Oral BID    PRN meds: acetaminophen, guaiFENesin, HYDROcodone-acetaminophen, ondansetron (ZOFRAN) IV   Antimicrobials: Anti-infectives (From admission, onward)    Start     Dose/Rate Route Frequency Ordered Stop   04/04/21 1423  vancomycin (VANCOCIN) powder  Status:  Discontinued          As needed 04/04/21 1424 04/04/21 1551   04/04/21 1200  vancomycin (VANCOREADY) IVPB 1500 mg/300 mL        1,500 mg 150 mL/hr over 120 Minutes Intravenous On call to O.R. 04/03/21 1241 04/04/21 1529   04/04/21 1145  ceFAZolin (ANCEF) IVPB 2g/100 mL premix  Status:  Discontinued        2 g 200 mL/hr over 30 Minutes Intravenous On call to O.R. 04/04/21 0942 04/04/21 1717       Objective: Vitals:   04/07/21 0521 04/07/21 0751  BP: 123/82   Pulse: 89   Resp: 20   Temp: 97.8 F (36.6 C)   SpO2: 97% 95%    Intake/Output Summary (Last 24 hours) at 04/07/2021 1318 Last data filed at 04/07/2021 0500 Gross per 24 hour  Intake --  Output 200 ml  Net -200 ml    Filed Weights   04/03/21 1709 04/06/21 0343 04/07/21 0220  Weight: 105.6 kg 107.6 kg 108.7 kg   Weight change: 1.1 kg Body mass index is 42.45 kg/m.   Physical Exam: General exam: Pleasant, elderly morbidly obese Caucasian female.  Feels  lousy Skin: No rashes, lesions or ulcers. HEENT: Atraumatic, normocephalic, no obvious bleeding Lungs: Diminished air entry in both bases. CVS: Regular rate and rhythm, no murmur GI/Abd soft, nontender, distended from obesity, bowel sound present CNS: Mostly sleeping, opens eyes and verbal,.  Able to have short conversation. Psychiatry: Sad affect Extremities: No pedal edema, no calf tenderness  Data Review: I have personally reviewed the laboratory data and studies available.  F/u labs ordered Unresulted Labs (From admission, onward)     Start     Ordered   04/08/21 0500  CBC with Differential/Platelet  Daily,   R     Question:  Specimen collection method  Answer:  Lab=Lab collect   04/07/21 1318   04/08/21 7681  Basic metabolic panel  Daily,   R     Question:  Specimen collection method  Answer:  Lab=Lab collect   04/07/21 1318            Signed, Terrilee Croak, MD [facility] 04/07/2021

## 2021-04-07 NOTE — Progress Notes (Signed)
° ° ° °  Subjective: 3 Days Post-Op Procedure(s) (LRB): INTRAMEDULLARY (IM) NAIL INTERTROCHANTRIC (Left) Awake, alert and using home O2, she is not moving at all while in bed and Seems to be more painfull overall. No BM  Patient reports pain as moderate.    Objective:   VITALS:  Temp:  [97.8 F (36.6 C)-98.6 F (37 C)] 97.8 F (36.6 C) (02/26 0521) Pulse Rate:  [84-89] 89 (02/26 0521) Resp:  [20-23] 20 (02/26 0521) BP: (118-123)/(62-82) 123/82 (02/26 0521) SpO2:  [95 %-99 %] 95 % (02/26 0751) Weight:  [108.7 kg] 108.7 kg (02/26 0220)  Neurologically intact ABD soft Neurovascular intact Sensation intact distally Intact pulses distally Dorsiflexion/Plantar flexion intact Incision: dressing C/D/I and no drainage   LABS Recent Labs    04/05/21 0438 04/06/21 0457 04/07/21 0114  HGB 9.0* 7.0* 7.1*  WBC 10.0 7.9 7.0  PLT 140* 116* 126*   Recent Labs    04/06/21 0457 04/07/21 0114  NA 134* 134*  K 4.5 5.0  CL 97* 97*  CO2 28 26  BUN 52* 64*  CREATININE 3.80* 4.46*  GLUCOSE 150* 132*   No results for input(s): LABPT, INR in the last 72 hours.   Assessment/Plan: 3 Days Post-Op Procedure(s) (LRB): INTRAMEDULLARY (IM) NAIL INTERTROCHANTRIC (Left)  Advance diet Up with therapy Discharge to SNF Need to mobilize this patient as she is at high risk of Thromboembolic disease and nosocomial pneumonia.  [name] 04/07/2021, 12:42 PM Patient ID: [name], female   DOB: 1945-03-01, 76 y.o.   MRN: [id number]

## 2021-04-08 ENCOUNTER — Other Ambulatory Visit: Payer: Self-pay | Admitting: *Deleted

## 2021-04-08 ENCOUNTER — Encounter (HOSPITAL_COMMUNITY): Payer: Self-pay | Admitting: Orthopaedic Surgery

## 2021-04-08 DIAGNOSIS — S72002A Fracture of unspecified part of neck of left femur, initial encounter for closed fracture: Secondary | ICD-10-CM | POA: Diagnosis not present

## 2021-04-08 LAB — GLUCOSE, CAPILLARY
Glucose-Capillary: 129 mg/dL — ABNORMAL HIGH (ref 70–99)
Glucose-Capillary: 142 mg/dL — ABNORMAL HIGH (ref 70–99)
Glucose-Capillary: 125 mg/dL — ABNORMAL HIGH (ref 70–99)

## 2021-04-08 LAB — TYPE AND SCREEN
ABO/RH(D): O POS
Antibody Screen: NEGATIVE
Unit division: 0
Unit division: 0

## 2021-04-08 LAB — CBC WITH DIFFERENTIAL/PLATELET
Abs Immature Granulocytes: 0.04 10*3/uL (ref 0.00–0.07)
Basophils Absolute: 0 10*3/uL (ref 0.0–0.1)
Basophils Relative: 0 %
Eosinophils Absolute: 0.1 10*3/uL (ref 0.0–0.5)
Eosinophils Relative: 1 %
HCT: 29.2 % — ABNORMAL LOW (ref 36.0–46.0)
Hemoglobin: 9.5 g/dL — ABNORMAL LOW (ref 12.0–15.0)
Immature Granulocytes: 0 %
Lymphocytes Relative: 5 %
Lymphs Abs: 0.4 10*3/uL — ABNORMAL LOW (ref 0.7–4.0)
MCH: 30.4 pg (ref 26.0–34.0)
MCHC: 32.5 g/dL (ref 30.0–36.0)
MCV: 93.6 fL (ref 80.0–100.0)
Monocytes Absolute: 0.5 10*3/uL (ref 0.1–1.0)
Monocytes Relative: 6 %
Neutro Abs: 8 10*3/uL — ABNORMAL HIGH (ref 1.7–7.7)
Neutrophils Relative %: 88 %
Platelets: 138 10*3/uL — ABNORMAL LOW (ref 150–400)
RBC: 3.12 MIL/uL — ABNORMAL LOW (ref 3.87–5.11)
RDW: 15 % (ref 11.5–15.5)
WBC: 9 10*3/uL (ref 4.0–10.5)
nRBC: 0.3 % — ABNORMAL HIGH (ref 0.0–0.2)

## 2021-04-08 LAB — BPAM RBC
Blood Product Expiration Date: 202303292359
Blood Product Expiration Date: 202303292359
ISSUE DATE / TIME: 202302261512
ISSUE DATE / TIME: 202302262135
Unit Type and Rh: 5100
Unit Type and Rh: 5100

## 2021-04-08 LAB — BASIC METABOLIC PANEL
Anion gap: 11 (ref 5–15)
BUN: 72 mg/dL — ABNORMAL HIGH (ref 8–23)
CO2: 27 mmol/L (ref 22–32)
Calcium: 8 mg/dL — ABNORMAL LOW (ref 8.9–10.3)
Chloride: 96 mmol/L — ABNORMAL LOW (ref 98–111)
Creatinine, Ser: 5.13 mg/dL — ABNORMAL HIGH (ref 0.44–1.00)
GFR, Estimated: 8 mL/min — ABNORMAL LOW (ref >60)
Glucose, Bld: 135 mg/dL — ABNORMAL HIGH (ref 70–99)
Potassium: 4.5 mmol/L (ref 3.5–5.1)
Sodium: 134 mmol/L — ABNORMAL LOW (ref 135–145)

## 2021-04-08 MED ORDER — FUROSEMIDE 10 MG/ML IJ SOLN
80.0000 mg | Freq: Two times a day (BID) | INTRAMUSCULAR | Status: DC
  Administered 2021-04-08 – 2021-04-09 (×2): 80 mg via INTRAVENOUS
  Filled 2021-04-08 (×2): qty 8

## 2021-04-08 NOTE — Progress Notes (Signed)
[facility] Progress Note  Subjective: SCr up from 4.5 to 5.1.  K 4.5 Made 0.9 UOP yesterday on TID lasix IV 80 Sleepy arouses to physical stimuli  Vitals:   04/08/21 0541 04/08/21 0743 04/08/21 0835 04/08/21 1137  BP: (!) 146/73  139/61 (!) 106/47  Pulse: 88  81 86  Resp: _0 Temp: 98.2 F (36.8 C)  99.3 F (37.4 C) 98 F (36.7 C)  TempSrc: Oral  Oral Oral  SpO2: 100% 100% 100% 94%  Weight:      Height:        Exam: Gen somnolent, deconditioned, no distress +jvd Chest bibasilar fine rales, no wheezing RRR no RG Abd soft obese ntnd no mass or ascites +bs MS no joint effusions or deformity Ext 1+ LE/ UE edema Neuro Ox 3 , nf      Home meds include - zyloprim, asa, bisoprolol 75 qd, suymbicort, plavix, colace, jardiance, imdur, protonix, klorcon 20 qd, crestor, demadex 60 - 80 bid, prns/ vits/ supps       UA 2/25 cloudy, large Hb, 100 prot, 0-5 rbd, 6-10 wbc     Una 14,  UCr 63    Renal US showed echogenic kidneys w/o hydro (10.8/ 9.8 cm) w/ multiple cysts bilat and bilat cortical thinning      Aug - dec 2022 = 1.3- 1.8, eGFR 28- 44, CKD 3b       CXR 2/22 mild vasc congestion         alb 2.9     Assessment/ Plan: AKI on CKD 3b - b/l creat 1.3- 1.8, eGFR 28- 44, CKD 3b from aug - dec 2022. Creat here 1.6 on admission 2/22 in setting of a fall w/ hip fracture and surgery. Has had low BP's post op. Suspect ATN. Renal US showed no obstruction and echogenic kidneys. UA unremarkable. Started midodrine. UOP improved in past 24h with lasix.  Reduce lasix to BID today. No indication for RRT.  She is at risk for needing HD in next 1-2 days. Will follow closely.  L hip fracture - sp nailing L hip 2/23 per ortho Anemia - d/t blood loss. Per primary HTN - bp's soft to low-normal . Holding home BB, imdur and demadex CAD sp CABG SP PPM/ ICD DM2 Deconditioning   Rexene Agent, MD  04/08/2021, 12:34 PM   Recent Labs  Lab 04/03/21 1000 04/04/21 0226  04/07/21 0114 04/08/21 0301  K 3.4*   < > 5.0 4.5  BUN 18   < > 64* 72*  CREATININE 1.69*   < > 4.46* 5.13*  ALBUMIN 2.9*  --   --   --   CALCIUM 8.7*   < > 7.9* 8.0*  HGB 10.8*   < > 7.1* 9.5*   < > = values in this interval not displayed.    Inpatient medications:  allopurinol  100 mg Oral Daily   aspirin EC  81 mg Oral Daily   Chlorhexidine Gluconate Cloth  6 each Topical Daily   clopidogrel  75 mg Oral Daily   cycloSPORINE  1 drop Both Eyes BID   furosemide  80 mg Intravenous BID   insulin aspart  0-9 Units Subcutaneous TID WC   loratadine  10 mg Oral Daily   midodrine  10 mg Oral TID WC   mometasone-formoterol  2 puff Inhalation BID   pantoprazole  40 mg Oral BID   polyethylene glycol  17 g Oral Daily   polyvinyl alcohol  1 drop Both Eyes Q12H   rosuvastatin  5 mg Oral QPM   senna  1 tablet Oral BID    acetaminophen, guaiFENesin, HYDROcodone-acetaminophen, ondansetron (ZOFRAN) IV

## 2021-04-08 NOTE — Progress Notes (Addendum)
Physical Therapy Treatment Patient Details Name: [name] MRN: [id number] DOB: 1945-04-04 Today's Date: 04/08/2021   History of Present Illness Pt is a 76 y.o. female admitted 04/03/21 after fall at home when leg gave out sustaining L displaced intertrochanteric hip fx. S/p L hip cephalomedullary nail and closed management of superior pubic ramus fx 2/23. PMH includes afib, CAD, CHF, HTN, pacemaker, asthma, DM2, stroke, OSA, cervical sx, back sx, R ankle fx.    PT Comments    Pt very limited by lethargy/unarousable.  RN reports pt more alert earlier but received oral pain meds about an hour ago; creatinine also increasing and pt did have myoclonic jerks in bil UE.  Attempted positioning in chair position, LE exercises, lights on bright, verbal/tactile cues, and cold wash cloth to arouse for PT but unable other than a few exercises with max cues to stay awake. Will f/u as able.   Pt still very lethargic in afternoon around 14:00, will f/u later date    Recommendations for follow up therapy are one component of a multi-disciplinary discharge planning process, led by the attending physician.  Recommendations may be updated based on patient status, additional functional criteria and insurance authorization.  Follow Up Recommendations  Skilled nursing-short term rehab (<3 hours/day)     Assistance Recommended at Discharge Frequent or constant Supervision/Assistance  Patient can return home with the following Two people to help with walking and/or transfers;Two people to help with bathing/dressing/bathroom;Assistance with cooking/housework;Assist for transportation;Help with stairs or ramp for entrance   Equipment Recommendations  Rolling walker (2 wheels);BSC/3in1;Wheelchair (measurements PT);Wheelchair cushion (measurements PT);Hospital bed (mobility lift)    Recommendations for Other Services       Precautions / Restrictions Precautions Precautions: Fall;Other (comment) Precaution  Comments: Wears 3L O2 baseline Restrictions Weight Bearing Restrictions: No LLE Weight Bearing: Weight bearing as tolerated     Mobility  Bed Mobility               General bed mobility comments: Unable to participate with any bed mobility due to lethargy    Transfers                        Ambulation/Gait                   Stairs             Wheelchair Mobility    Modified Rankin (Stroke Patients Only)       Balance       Sitting balance - Comments: unable due to lethargy                                    Cognition Arousal/Alertness: Lethargic, Suspect due to medications Behavior During Therapy: WFL for tasks assessed/performed Overall Cognitive Status: Difficult to assess                                          Exercises General Exercises - Lower Extremity Ankle Circles/Pumps: AAROM, Both, 15 reps, Supine (Max cues) Short Arc Quad: AAROM, Both, 5 reps, Supine (Max cues) Heel Slides: PROM, Left, 10 reps, Supine (Max cues)    General Comments        Pertinent Vitals/Pain Pain Assessment Pain Assessment: Faces Faces Pain Scale: Hurts a little bit Pain Location:  Grimaces with L LE movement but goes right back to sleep    Home Living                          Prior Function            PT Goals (current goals can now be found in the care plan section) Progress towards PT goals: Not progressing toward goals - comment (lethargic)    Frequency    Min 3X/week      PT Plan Current plan remains appropriate    Co-evaluation              AM-PAC PT "6 Clicks" Mobility   Outcome Measure  Help needed turning from your back to your side while in a flat bed without using bedrails?: Total Help needed moving from lying on your back to sitting on the side of a flat bed without using bedrails?: Total Help needed moving to and from a bed to a chair (including a wheelchair)?:  Total Help needed standing up from a chair using your arms (e.g., wheelchair or bedside chair)?: Total Help needed to walk in hospital room?: Total Help needed climbing 3-5 steps with a railing? : Total 6 Click Score: 6    End of Session Equipment Utilized During Treatment: Oxygen Activity Tolerance: Patient limited by lethargy Patient left: in bed;with call bell/phone within reach;with bed alarm set (positioned in chair position ; heels floated) Nurse Communication: Mobility status;Need for lift equipment (RN present and aware of lethargy) PT Visit Diagnosis: Other abnormalities of gait and mobility (R26.89);Muscle weakness (generalized) (M62.81);Pain Pain - Right/Left: Left Pain - part of body: Leg     Time: 1110-1131 PT Time Calculation (min) (ACUTE ONLY): 21 min  Charges:  $Therapeutic Exercise: 8-22 mins                     Abran Richard, PT Acute Rehab Services Pager [phone] [facility] [phone]    Karlton Lemon 04/08/2021, 1:13 PM

## 2021-04-08 NOTE — NC FL2 (Signed)
[place] LEVEL OF CARE SCREENING TOOL     IDENTIFICATION  Patient Name: [name] Birthdate: Dec 06, 1945 Sex: female Admission Date (Current Location): 04/03/2021  [place] and [place] Number:  [name] [id number] [place] and Address:  [facility] [place], [facility] [address], [place], [place] [address]      Provider Number: [id number]  Attending Physician Name and Address:  Terrilee Croak, MD  Relative Name and Phone Number:  [place], [place] [phone]    Current Level of Care: Hospital Recommended Level of Care: [place] Prior Approval Number:    Date Approved/Denied:   PASRR Number: [id number]  Discharge Plan: SNF    Current Diagnoses: Patient Active Problem List   Diagnosis Date Noted   Closed left hip fracture ([place]) secondary to fall at home 04/03/2021   Closed fracture of left superior pubic ramus ([place]) 04/03/2021   Chronic kidney disease, stage III B (moderate) ([place]) 04/03/2021   Fall at home, initial encounter 04/03/2021   Hypokalemia 04/03/2021   Obesity hypoventilation syndrome ([place]) 04/02/2021   Primary osteoarthritis, left ankle and foot 02/05/2021   Allergy to shellfish 01/15/2021   Decreased estrogen level 01/15/2021   Diabetic renal disease ([place]) 01/15/2021   Gallstones 01/15/2021   Idiopathic gout 01/15/2021   Mild intermittent asthma 01/15/2021   Pure hypercholesterolemia 01/15/2021   CHF (congestive heart failure) ([place]) 01/07/2021   Chronic diastolic heart failure ([place]) 12/31/2020   Nasal congestion with rhinorrhea 10/24/2020   Carotid stenosis 10/01/2020   Ischemic cardiomyopathy 09/27/2020   Hypertension 09/19/2020   Acute left-sided weakness 09/16/2020   Blood blister 08/01/2020   Elevated troponin 06/01/2019   Constipation 10/21/2018   Chronic pain 07/30/2018   Congestive heart failure (CHF) ([place]) 07/30/2018   Supplemental oxygen dependent 07/30/2018   Leukopenia 05/03/2018    Thrombocytopenia ([place]) 05/03/2018   Pneumonia due to human metapneumovirus 05/02/2018   Chest pain of uncertain etiology 40/09/6759   Allergic rhinitis 04/13/2018   S/P laparoscopic cholecystectomy 04/09/2018   Chronic respiratory failure ([place]) 03/07/2018   Polyp of cecum    Polyp of transverse colon    Positive colorectal cancer screening using Cologuard test    Dysphagia    Acute on chronic renal failure ([place]) 11/20/2017   Coronary artery disease of native artery of native heart with stable angina pectoris ([place]) 11/20/2017   Diabetes mellitus without complication ([place]) 95/10/3265   Abdominal pain, epigastric 11/20/2017   Vasomotor rhinitis 10/16/2016   Right facial numbness 03/05/2016   Chronic renal failure syndrome 03/05/2016   Atrial fibrillation ([place]) 10/24/2015   Chest pain with moderate risk of acute coronary syndrome 10/23/2015   History of TIA (transient ischemic attack) 10/22/2015   CHB (complete heart block) ([place]) 06/22/2015   Allergic drug rash due to anti-infective agent 04/29/2015   Fatigue 02/14/2015   Chronic diastolic CHF (congestive heart failure) ([place]) 02/14/2015   Cellulitis 12/21/2014   S/P CABG x 5 11/29/2014   CAD S/P percutaneous coronary angioplasty    Diarrhea 07/12/2014   Generalized abdominal pain 07/12/2014   Acute on chronic diastolic heart failure ([place]) 11/04/2013   Mixed hypercholesterolemia and hypertriglyceridemia 04/22/2013   Vertigo 04/22/2013   Dyspnea on exertion 08/25/2012   Allergic to IV contrast 07/23/2012   Unstable angina ([place]) 07/22/2012   Obesity 11/22/2011   Cardiomyopathy ([place]) 11/22/2011   Gastroesophageal reflux disease 11/22/2011   Back pain 11/22/2011   OSA (obstructive sleep apnea) 11/22/2011   HEMORRHOIDS-EXTERNAL 02/21/2010   NAUSEA 02/21/2010  ABDOMINAL PAIN -GENERALIZED 02/21/2010   PERSONAL HX COLONIC POLYPS 02/21/2010   ANEMIA 04/16/2007   HTN (hypertension), benign 04/16/2007   DIVERTICULOSIS, COLON 04/16/2007    ARTHRITIS 04/16/2007   INTERNAL HEMORRHOIDS WITHOUT MENTION COMP 04/12/2007   Sleep apnea 04/12/2007   Cardiac resynchronization therapy pacemaker (CRT-P) in place 04/12/2007   Gastritis without bleeding 12/14/2006   COLONIC POLYPS, HYPERPLASTIC 09/27/2002   FATTY LIVER DISEASE 02/16/2002    Orientation RESPIRATION BLADDER Height & Weight     Self, Time, Situation, Place  O2 Incontinent, Indwelling catheter Weight: 239 lb 10.2 oz (108.7 kg) Height:  5\' 3"  (160 cm)  BEHAVIORAL SYMPTOMS/MOOD NEUROLOGICAL BOWEL NUTRITION STATUS      Incontinent Diet (see discharge summary)  AMBULATORY STATUS COMMUNICATION OF NEEDS Skin   Total Care Verbally Surgical wounds                       Personal Care Assistance Level of Assistance  Bathing, Feeding, Dressing Bathing Assistance: Maximum assistance Feeding assistance: Limited assistance Dressing Assistance: Maximum assistance     Functional Limitations Info  Sight, Hearing, Speech Sight Info: Adequate Hearing Info: Adequate Speech Info: Adequate    SPECIAL CARE FACTORS FREQUENCY  PT (By licensed PT), OT (By licensed OT)     PT Frequency: 5x week OT Frequency: 5x week            Contractures Contractures Info: Not present    Additional Factors Info  Code Status, Allergies, Insulin Sliding Scale Code Status Info: full Allergies Info: Ivp Dye (Iodinated Contrast Media), Shellfish Allergy, Sulfa Antibiotics, Iodine, Atorvastatin, Benadryl (Diphenhydramine), Colchicine, Contrast Media (Iodinated Contrast Media), Doxycycline, Uloric (Febuxostat), Cephalexin, Zithromax (Azithromycin)   Insulin Sliding Scale Info: Novolog, 0-9 units TID with meals.  See discharge summary.       Current Medications (04/08/2021):  This is the current hospital active medication list Current Facility-Administered Medications  Medication Dose Route Frequency Provider Last Rate Last Admin   acetaminophen (TYLENOL) tablet 650 mg  650 mg Oral Q6H  PRN Vanetta Mulders, MD   650 mg at 04/07/21 1016   allopurinol (ZYLOPRIM) tablet 100 mg  100 mg Oral Daily Vanetta Mulders, MD   100 mg at 04/08/21 1024   aspirin EC tablet 81 mg  81 mg Oral Daily Vanetta Mulders, MD   81 mg at 04/08/21 1024   Chlorhexidine Gluconate Cloth 2 % PADS 6 each  6 each Topical Daily Roney Jaffe, MD   6 each at 04/08/21 1027   clopidogrel (PLAVIX) tablet 75 mg  75 mg Oral Daily Vanetta Mulders, MD   75 mg at 04/08/21 1024   cycloSPORINE (RESTASIS) 0.05 % ophthalmic emulsion 1 drop  1 drop Both Eyes BID Vanetta Mulders, MD   1 drop at 04/08/21 1025   furosemide (LASIX) injection 80 mg  80 mg Intravenous BID Pearson Grippe B, MD       guaiFENesin (MUCINEX) 12 hr tablet 600 mg  600 mg Oral BID PRN Vanetta Mulders, MD   600 mg at 04/08/21 0548   HYDROcodone-acetaminophen (NORCO/VICODIN) 5-325 MG per tablet 1-2 tablet  1-2 tablet Oral Q6H PRN Vanetta Mulders, MD   2 tablet at 04/08/21 1035   insulin aspart (novoLOG) injection 0-9 Units  0-9 Units Subcutaneous TID WC Vanetta Mulders, MD   1 Units at 04/08/21 1028   loratadine (CLARITIN) tablet 10 mg  10 mg Oral Daily Vanetta Mulders, MD   10 mg at 04/08/21 1024   midodrine (PROAMATINE) tablet  10 mg  10 mg Oral TID WC Roney Jaffe, MD   10 mg at 04/08/21 1137   mometasone-formoterol (DULERA) 200-5 MCG/ACT inhaler 2 puff  2 puff Inhalation BID Vanetta Mulders, MD   2 puff at 04/08/21 0743   ondansetron (ZOFRAN) injection 4 mg  4 mg Intravenous Q6H PRN Vanetta Mulders, MD   4 mg at 04/08/21 0051   pantoprazole (PROTONIX) EC tablet 40 mg  40 mg Oral BID Vanetta Mulders, MD   40 mg at 04/08/21 1024   polyethylene glycol (MIRALAX / GLYCOLAX) packet 17 g  17 g Oral Daily Vanetta Mulders, MD   17 g at 04/08/21 1026   polyvinyl alcohol (LIQUIFILM TEARS) 1.4 % ophthalmic solution 1 drop  1 drop Both Eyes Q12H Vanetta Mulders, MD   1 drop at 04/08/21 1025   rosuvastatin (CRESTOR) tablet 5 mg  5 mg Oral QPM Dahal, Marlowe Aschoff, MD   5 mg  at 04/07/21 1744   senna (SENOKOT) tablet 8.6 mg  1 tablet Oral BID Vanetta Mulders, MD   8.6 mg at 04/08/21 1024     Discharge Medications: Please see discharge summary for a list of discharge medications.  Relevant Imaging Results:  Relevant Lab Results:   Additional Information SSN: [id number].  Pt is vaccinated for covid with 3 boosters.  Joanne Chars, LCSW

## 2021-04-08 NOTE — Patient Outreach (Signed)
[facility]) Care Management  04/08/2021  [name] 08/11/1945 [id number]   [place] coordination- Inpatient admission noted  At scheduled outreach noted admission  Inpatient admission 04/03/21 at [facility] after a fall at home. H & P indicates she was not using her walker and fell on left side. In ED she was mildly tachypnea, left hip fracture with left superior pubic ramus fracture, cardiomegaly with mild pulmonary vascular congestion, hgb 10.8 BUN/cr 18/1.69 BNP 334   S/p left hip cephalo medullary nail on 04/04/21, acute blood loss anemia (hgb 7 on 04/06/21) corrected s/p transfusion 04/07/21 today hgb 9.5  Plan Pending discharge plan [facility] RN CM will follow up with patient after informed of her discharge home   [place]. Lavina Hamman, RN, BSN, [place] Coordinator Office number [phone]

## 2021-04-08 NOTE — Progress Notes (Signed)
PROGRESS NOTE  [name]  DOB: 07-01-1945  PCP: Alicia Amel, MD SJG:[id number]  DOA: 04/03/2021  LOS: 5 days  Hospital Day: 6  Brief narrative: [name] is a 76 y.o. female with PMH significant for DM2, HTN, morbid obesity, OSA, OHS chronically on 2 L oxygen at home, CAD s/p CABG '16 (LIMA to LAD, SVG to D1, SVG to OM1 and OM2, SVG to PDA), HFpEF, complete heart block s/p PPM/ICD, CKD stage III, adrenal insufficiency. Patient presented to the ED on 04/03/2021 after a fall at home.  She lives at home with her son, ambulates inside the home without a walker but uses walker to walk.   On 2/22, patient was up and felt her left leg suddenly gave out and she fell on the floor landing on her left side and started immediately hurting in the left hip and leg.  Denies any premonition symptoms like chest pain, shortness of breath, palpitation, dizziness, nausea, vomiting.  Did not pass out or hit her head. EMS brought her to the ED.  In the ED, patient was hemodynamically stable. Labs with hemoglobin of 10.8, serum bicarb 33, BUN/creatinine 18/1.69, BNP 334. Chest x-ray with cardiomegaly and mild pulmonary vascular congestion. X-ray noted a displaced intertrochanteric left hip fracture with left superior pubic ramus fracture Admitted to hospitalist service Orthopedics consulted.  Subjective: Patient was seen and examined this morning. Tired, sleepy. Urine output of 1200 mL last 24 hours. Family not at bedside.  Principal Problem:   Closed left hip fracture ([place]) secondary to fall at home Active Problems:   ANEMIA   HTN (hypertension), benign   Mixed hypercholesterolemia and hypertriglyceridemia   S/P CABG x 5   Chronic diastolic CHF (congestive heart failure) (HCC)   CHB (complete heart block) (HCC)   Diabetes mellitus without complication (HCC)   Chronic respiratory failure (HCC)   Thrombocytopenia (HCC)   Closed fracture of left superior pubic ramus (HCC)   Chronic  kidney disease, stage III B (moderate) ([place])   Fall at home, initial encounter   Hypokalemia    Assessment and Plan: AKI on CKD 3B -Baseline creatinine less than 1.7.   -Postoperatively, patient was hypotensive for few hours. Her creatinine has been trending up from the next day. -I had held her diuretics before the surgery.  Postsurgically, I started her on IV fluid.  Despite that creatinine continue to worsen.  Nephrology consultation was obtained on 2/25.  -On 2/26, patient was noted to have anasarca.  Fluid was stopped.  She was started on IV Lasix 80 mg 3 times daily. -In last 24 hours, she has made 1200 mill of urine output.  Creatinine continues to rise however.  Noted a plan from nephrology today to lower Lasix to twice daily dosing. Recent Labs    01/10/21 0039 01/21/21 1221 02/14/21 0112 03/18/21 1127 04/03/21 1000 04/04/21 0226 04/05/21 0438 04/06/21 0457 04/07/21 0114 04/08/21 0301  BUN 26* 30* 24* 22 18 18  35* 52* 64* 72*  CREATININE 1.76* 1.84* 1.58* 1.55* 1.69* 1.75* 2.73* 3.80* 4.46* 6.54*   Acute metabolic encephalopathy -Patient mostly sleepy.  Primary due to uremia. -Once and if renal function improves, I expect her mental status to improve. -Continue to monitor.  Closed left hip fracture secondary to fall at home Left superior pubic ramus fracture -Patient had a mechanical fall at home impacting the left hip sustaining closed left hip fracture as well as the left superior pubic ramus fracture.   -2/23, patient underwent cephalomedullary nailing  of left hip. -Pain control and DVT prophylaxis per orthopedics  Chronic diastolic CHF Hypertension -Last echocardiogram noted EF of 55 to 60% with grade 1 diastolic dysfunction in 0/0867.  Right ventricular size and systolic function normal. -Home meds include beta-blocker, Imdur, torsemide. -Currently all her meds are on hold.   CAD S/P CABG x 5 HLD -Last cardiac catheter was in 02/2019 where all territories  were noted to have adequate coronary flow via bypasses or native vessels with stents. -Continue aspirin, Plavix, beta-blocker, isosorbide mononitrate and Crestor   CHB (complete heart block)    S/p PPM/ICD    Type 2 diabetes mellitus -A1c 6.5 in November 2022 -Home meds include Jardiance 10 mg daily -Currently on hold.  Continue sliding scale insulin. -Blood sugar level remain controlled Recent Labs  Lab 04/06/21 2234 04/07/21 0732 04/07/21 1200 04/07/21 1731 04/07/21 2052  GLUCAP 135* 119* 136* 121* 147*   Acute blood loss anemia  Chronic mild macrocytic anemia -Baseline hemoglobin more than 10.   -Postoperatively, hemoglobin down trended to the lowest of 7 on 2/25.  2 units of PRBCs was given on 2/26.  Hemoglobin better at 9.5 this morning.  Continue to monitor.  No active bleeding from elsewhere Recent Labs    04/04/21 0226 04/05/21 0438 04/06/21 0457 04/07/21 0114 04/08/21 0301  HGB 10.0* 9.0* 7.0* 7.1* 9.5*  MCV 98.8 101.0* 98.3 98.4 93.6   Morbid obesity  -Body mass index is 42.45 kg/m. Patient has been advised to make an attempt to improve diet and exercise patterns to aid in weight loss.  Chronic respiratory failure  OSA/OHS -Continue nasal cannula oxygen 3 L  Mobility: Needs PT eval postprocedure Goals of care   Code Status: Full Code    Nutritional status:  Body mass index is 42.45 kg/m.      Diet:  Diet Order             Diet renal with fluid restriction Fluid restriction: 1200 mL Fluid; Room service appropriate? Yes; Fluid consistency: Thin  Diet effective now                   DVT prophylaxis:     Antimicrobials: None Fluid: None Consultants: Orthopedics Family Communication: None at bedside.  Called and updated patient's son Mr. [name] this afternoon.  Status is: Inpatient  Continue in-hospital care because: AKI postprocedure, not improving yet Level of care: Telemetry Surgical   Dispo: The patient is from: Home               Anticipated d/c is to: PT eval obtained.  SNF recommended              Patient currently is not medically stable to d/c.   Difficult to place patient No     Infusions:     Scheduled Meds:  allopurinol  100 mg Oral Daily   aspirin EC  81 mg Oral Daily   Chlorhexidine Gluconate Cloth  6 each Topical Daily   clopidogrel  75 mg Oral Daily   cycloSPORINE  1 drop Both Eyes BID   furosemide  80 mg Intravenous Q8H   insulin aspart  0-9 Units Subcutaneous TID WC   loratadine  10 mg Oral Daily   midodrine  10 mg Oral TID WC   mometasone-formoterol  2 puff Inhalation BID   pantoprazole  40 mg Oral BID   polyethylene glycol  17 g Oral Daily   polyvinyl alcohol  1 drop Both Eyes Q12H  rosuvastatin  5 mg Oral QPM   senna  1 tablet Oral BID    PRN meds: acetaminophen, guaiFENesin, HYDROcodone-acetaminophen, ondansetron (ZOFRAN) IV   Antimicrobials: Anti-infectives (From admission, onward)    Start     Dose/Rate Route Frequency Ordered Stop   04/04/21 1423  vancomycin (VANCOCIN) powder  Status:  Discontinued          As needed 04/04/21 1424 04/04/21 1551   04/04/21 1200  vancomycin (VANCOREADY) IVPB 1500 mg/300 mL        1,500 mg 150 mL/hr over 120 Minutes Intravenous On call to O.R. 04/03/21 1241 04/04/21 1529   04/04/21 1145  ceFAZolin (ANCEF) IVPB 2g/100 mL premix  Status:  Discontinued        2 g 200 mL/hr over 30 Minutes Intravenous On call to O.R. 04/04/21 0942 04/04/21 1717       Objective: Vitals:   04/08/21 0743 04/08/21 0835  BP:  139/61  Pulse:  81  Resp:  17  Temp:  99.3 F (37.4 C)  SpO2: 100% 100%    Intake/Output Summary (Last 24 hours) at 04/08/2021 1102 Last data filed at 04/08/2021 0407 Gross per 24 hour  Intake 727 ml  Output 1200 ml  Net -473 ml    Filed Weights   04/03/21 1709 04/06/21 0343 04/07/21 0220  Weight: 105.6 kg 107.6 kg 108.7 kg   Weight change:  Body mass index is 42.45 kg/m.   Physical Exam: General exam: Pleasant,  elderly morbidly obese Caucasian female.  Lousy. Skin: No rashes, lesions or ulcers. HEENT: Atraumatic, normocephalic, no obvious bleeding Lungs: Diminished air entry in both bases because of sleepiness, poor respiratory effort. CVS: Regular rate and rhythm, no murmur GI/Abd soft, nontender, distended from obesity, bowel sound present CNS: Mostly sleeping, opens eyes and verbal,.  Able to have short conversation. Psychiatry: Sad affect Extremities: No pedal edema, no calf tenderness  Data Review: I have personally reviewed the laboratory data and studies available.  F/u labs ordered Unresulted Labs (From admission, onward)     Start     Ordered   04/08/21 0500  CBC with Differential/Platelet  Daily,   R     Question:  Specimen collection method  Answer:  Lab=Lab collect   04/07/21 1318   04/08/21 8032  Basic metabolic panel  Daily,   R     Question:  Specimen collection method  Answer:  Lab=Lab collect   04/07/21 1318            Signed, Terrilee Croak, MD [facility] 04/08/2021

## 2021-04-08 NOTE — TOC Initial Note (Signed)
Transition of Care [facility]) - Initial/Assessment Note    Patient Details  Name: [name] MRN: [id number] Date of Birth: 08-26-1945  Transition of Care [facility]) CM/SW Contact:    Joanne Chars, LCSW Phone Number: 04/08/2021, 2:12 PM  Clinical Narrative:   Pt unable to participate in assessment today, CSW spoke with son [name] by phone.  Pt lives at home with a different son, per [name], [facility] services currently in place but does not recall the name of the company.  [name] is agreeable to SNF placement, CSW placed choice document in pt room and he will look at it when he arrives at the hospital.  Pt was at [facility] in past and does not want to return there, first choice would be [facility]. Permission given to send out referral in hub.  Pt is vaccinated for covid with all 3 boosters.                  Referral sent out in hub.   Expected Discharge Plan: Skilled Nursing Facility Barriers to Discharge: Continued Medical Work up, SNF Pending bed offer   Patient Goals and CMS Choice   CMS Medicare.gov Compare Post Acute Care list provided to:: Patient Represenative (must comment) Choice offered to / list presented to : Adult Children  Expected Discharge Plan and Services Expected Discharge Plan: [place] In-house Referral: Clinical Social Work   Post Acute Care Choice: [place] Living arrangements for the past 2 months: [place]                                      Prior Living Arrangements/Services Living arrangements for the past 2 months: Single Family Home Lives with:: Adult Children Patient language and need for interpreter reviewed:: No        Need for Family Participation in Patient Care: Yes (Comment) Care giver support system in place?: Yes (comment) Current home services: Home OT, Home PT (son reports HH in place--does not know name of company) Criminal Activity/Legal Involvement Pertinent to Current  Situation/Hospitalization: No - Comment as needed  Activities of Daily Living Home Assistive Devices/Equipment: None ADL Screening (condition at time of admission) Patient's cognitive ability adequate to safely complete daily activities?: Yes Is the patient deaf or have difficulty hearing?: No Does the patient have difficulty seeing, even when wearing glasses/contacts?: No Does the patient have difficulty concentrating, remembering, or making decisions?: No Patient able to express need for assistance with ADLs?: Yes Does the patient have difficulty dressing or bathing?: No Independently performs ADLs?: Yes (appropriate for developmental age) Does the patient have difficulty walking or climbing stairs?: No Weakness of Legs: Left Weakness of Arms/Hands: None  Permission Sought/Granted                  Emotional Assessment Appearance:: Appears stated age Attitude/Demeanor/Rapport: Unable to Assess Affect (typically observed): Unable to Assess Orientation: : Oriented to Self Alcohol / Substance Use: Not Applicable Psych Involvement: No (comment)  Admission diagnosis:  Knee pain, left [M25.562] Closed left hip fracture ([place]) [S72.002A] Patient Active Problem List   Diagnosis Date Noted   Closed left hip fracture ([place]) secondary to fall at home 04/03/2021   Closed fracture of left superior pubic ramus ([place]) 04/03/2021   Chronic kidney disease, stage III B (moderate) ([place]) 04/03/2021   Fall at home, initial encounter 04/03/2021   Hypokalemia 04/03/2021  Obesity hypoventilation syndrome ([place]) 04/02/2021   Primary osteoarthritis, left ankle and foot 02/05/2021   Allergy to shellfish 01/15/2021   Decreased estrogen level 01/15/2021   Diabetic renal disease ([place]) 01/15/2021   Gallstones 01/15/2021   Idiopathic gout 01/15/2021   Mild intermittent asthma 01/15/2021   Pure hypercholesterolemia 01/15/2021   CHF (congestive heart failure) ([place]) 01/07/2021   Chronic diastolic heart  failure ([place]) 12/31/2020   Nasal congestion with rhinorrhea 10/24/2020   Carotid stenosis 10/01/2020   Ischemic cardiomyopathy 09/27/2020   Hypertension 09/19/2020   Acute left-sided weakness 09/16/2020   Blood blister 08/01/2020   Elevated troponin 06/01/2019   Constipation 10/21/2018   Chronic pain 07/30/2018   Congestive heart failure (CHF) ([place]) 07/30/2018   Supplemental oxygen dependent 07/30/2018   Leukopenia 05/03/2018   Thrombocytopenia ([place]) 05/03/2018   Pneumonia due to human metapneumovirus 05/02/2018   Chest pain of uncertain etiology 30/86/5784   Allergic rhinitis 04/13/2018   S/P laparoscopic cholecystectomy 04/09/2018   Chronic respiratory failure ([place]) 03/07/2018   Polyp of cecum    Polyp of transverse colon    Positive colorectal cancer screening using Cologuard test    Dysphagia    Acute on chronic renal failure ([place]) 11/20/2017   Coronary artery disease of native artery of native heart with stable angina pectoris ([place]) 11/20/2017   Diabetes mellitus without complication ([place]) 69/62/9528   Abdominal pain, epigastric 11/20/2017   Vasomotor rhinitis 10/16/2016   Right facial numbness 03/05/2016   Chronic renal failure syndrome 03/05/2016   Atrial fibrillation ([place]) 10/24/2015   Chest pain with moderate risk of acute coronary syndrome 10/23/2015   History of TIA (transient ischemic attack) 10/22/2015   CHB (complete heart block) ([place]) 06/22/2015   Allergic drug rash due to anti-infective agent 04/29/2015   Fatigue 02/14/2015   Chronic diastolic CHF (congestive heart failure) ([place]) 02/14/2015   Cellulitis 12/21/2014   S/P CABG x 5 11/29/2014   CAD S/P percutaneous coronary angioplasty    Diarrhea 07/12/2014   Generalized abdominal pain 07/12/2014   Acute on chronic diastolic heart failure ([place]) 11/04/2013   Mixed hypercholesterolemia and hypertriglyceridemia 04/22/2013   Vertigo 04/22/2013   Dyspnea on exertion 08/25/2012   Allergic to IV contrast  07/23/2012   Unstable angina ([place]) 07/22/2012   Obesity 11/22/2011   Cardiomyopathy ([place]) 11/22/2011   Gastroesophageal reflux disease 11/22/2011   Back pain 11/22/2011   OSA (obstructive sleep apnea) 11/22/2011   HEMORRHOIDS-EXTERNAL 02/21/2010   NAUSEA 02/21/2010   ABDOMINAL PAIN -GENERALIZED 02/21/2010   PERSONAL HX COLONIC POLYPS 02/21/2010   ANEMIA 04/16/2007   HTN (hypertension), benign 04/16/2007   DIVERTICULOSIS, COLON 04/16/2007   ARTHRITIS 04/16/2007   INTERNAL HEMORRHOIDS WITHOUT MENTION COMP 04/12/2007   Sleep apnea 04/12/2007   Cardiac resynchronization therapy pacemaker (CRT-P) in place 04/12/2007   Gastritis without bleeding 12/14/2006   COLONIC POLYPS, HYPERPLASTIC 09/27/2002   FATTY LIVER DISEASE 02/16/2002   PCP:  Lujean Amel, MD Pharmacy:   [facility], [place] [address] Phone: [phone] Fax: [phone]  [facility] ([facility] ) - [place], [place] - [address] [place] [address] Phone: [phone] Fax: [phone]     Social Determinants of Health (SDOH) Interventions    Readmission Risk Interventions Readmission Risk Prevention Plan 01/10/2021  Transportation Screening Complete  PCP or Specialist Appt within 3-5 Days Complete  HRI or Home Care Consult Complete  Social Work Consult for [place] Planning/Counseling [place] Not Applicable  Medication Review Press photographer) Complete  Some recent data might be hidden

## 2021-04-08 NOTE — Progress Notes (Signed)
° °  Subjective:  Sleeping this AM on arrival, easily awakens. Has had difficulty time mobilizing due to pain. S/p blood transfusion  Objective:   VITALS:   Vitals:   04/07/21 2207 04/08/21 0052 04/08/21 0337 04/08/21 0541  BP: (!) 152/64 (!) 149/60 (!) 136/55 (!) 146/73  Pulse: 83 90 86 88  Resp: 20 20 20 18   Temp: 98.8 F (37.1 C) 98.4 F (36.9 C) 98.3 F (36.8 C) 98.2 F (36.8 C)  TempSrc: Oral Oral Oral Oral  SpO2: 98% 97% 99% 100%  Weight:      Height:      Dressing is clean dry intact.  Is able to dorsiflex plantarflex the left foot.  Fires EHL gastrocsoleus.  Sensation is intact throughout.  2+ dorsalis pedis pulse  Lab Results  Component Value Date   WBC 9.0 04/08/2021   HGB 9.5 (L) 04/08/2021   HCT 29.2 (L) 04/08/2021   MCV 93.6 04/08/2021   PLT 138 (L) 04/08/2021     Assessment/Plan:  Status post left hip cephalomedullary nail 2/23, acute blood loss anemia corrected s/p transfusion  - Continue to monitor anemia, vitals - Patient to work with PT/OT to optimize mobilization safely when able to safely do so - DVT ppx - SCDs, ambulation, okay with home anticoagulation aspirin Plavix - WBAT operative extremity  [name] 04/08/2021, 6:57 AM

## 2021-04-09 ENCOUNTER — Encounter: Payer: Self-pay | Admitting: Gastroenterology

## 2021-04-09 ENCOUNTER — Other Ambulatory Visit (HOSPITAL_COMMUNITY): Payer: Medicare Other

## 2021-04-09 DIAGNOSIS — S72002A Fracture of unspecified part of neck of left femur, initial encounter for closed fracture: Secondary | ICD-10-CM | POA: Diagnosis not present

## 2021-04-09 LAB — GLUCOSE, CAPILLARY
Glucose-Capillary: 111 mg/dL — ABNORMAL HIGH (ref 70–99)
Glucose-Capillary: 112 mg/dL — ABNORMAL HIGH (ref 70–99)
Glucose-Capillary: 118 mg/dL — ABNORMAL HIGH (ref 70–99)

## 2021-04-09 LAB — CBC WITH DIFFERENTIAL/PLATELET
Abs Immature Granulocytes: 0.09 10*3/uL — ABNORMAL HIGH (ref 0.00–0.07)
Basophils Absolute: 0 10*3/uL (ref 0.0–0.1)
Basophils Relative: 0 %
Eosinophils Absolute: 0.1 10*3/uL (ref 0.0–0.5)
Eosinophils Relative: 2 %
HCT: 28.6 % — ABNORMAL LOW (ref 36.0–46.0)
Hemoglobin: 9 g/dL — ABNORMAL LOW (ref 12.0–15.0)
Immature Granulocytes: 1 %
Lymphocytes Relative: 6 %
Lymphs Abs: 0.4 10*3/uL — ABNORMAL LOW (ref 0.7–4.0)
MCH: 29.4 pg (ref 26.0–34.0)
MCHC: 31.5 g/dL (ref 30.0–36.0)
MCV: 93.5 fL (ref 80.0–100.0)
Monocytes Absolute: 0.7 10*3/uL (ref 0.1–1.0)
Monocytes Relative: 9 %
Neutro Abs: 6.1 10*3/uL (ref 1.7–7.7)
Neutrophils Relative %: 82 %
Platelets: 133 10*3/uL — ABNORMAL LOW (ref 150–400)
RBC: 3.06 MIL/uL — ABNORMAL LOW (ref 3.87–5.11)
RDW: 15.4 % (ref 11.5–15.5)
WBC: 7.4 10*3/uL (ref 4.0–10.5)
nRBC: 0.4 % — ABNORMAL HIGH (ref 0.0–0.2)

## 2021-04-09 LAB — BASIC METABOLIC PANEL
Anion gap: 13 (ref 5–15)
BUN: 83 mg/dL — ABNORMAL HIGH (ref 8–23)
CO2: 26 mmol/L (ref 22–32)
Calcium: 7.7 mg/dL — ABNORMAL LOW (ref 8.9–10.3)
Chloride: 96 mmol/L — ABNORMAL LOW (ref 98–111)
Creatinine, Ser: 5.71 mg/dL — ABNORMAL HIGH (ref 0.44–1.00)
GFR, Estimated: 7 mL/min — ABNORMAL LOW (ref >60)
Glucose, Bld: 116 mg/dL — ABNORMAL HIGH (ref 70–99)
Potassium: 5.2 mmol/L — ABNORMAL HIGH (ref 3.5–5.1)
Sodium: 135 mmol/L (ref 135–145)

## 2021-04-09 MED ORDER — BISACODYL 10 MG RE SUPP
10.0000 mg | Freq: Every day | RECTAL | Status: DC | PRN
  Administered 2021-04-10: 10 mg via RECTAL
  Filled 2021-04-09: qty 1

## 2021-04-09 NOTE — Progress Notes (Addendum)
PROGRESS NOTE  ADDI PAK  DOB: 08-13-1945  PCP: Lujean Amel, MD IOX:[id number]  DOA: 04/03/2021  LOS: 6 days  Hospital Day: 7  Brief narrative: [name] is a 76 y.o. female with PMH significant for DM2, HTN, morbid obesity, OSA, OHS chronically on 2 L oxygen at home, CAD s/p CABG '16 (LIMA to LAD, SVG to D1, SVG to OM1 and OM2, SVG to PDA), HFpEF, complete heart block s/p PPM/ICD, CKD stage III, adrenal insufficiency. Patient presented to the ED on 04/03/2021 after a fall at home.  She lives at home with her son, ambulates inside the home without a walker but uses walker to walk.   On 2/22, patient was up and felt her left leg suddenly gave out and she fell on the floor landing on her left side and started immediately hurting in the left hip and leg.  Denies any premonition symptoms like chest pain, shortness of breath, palpitation, dizziness, nausea, vomiting.  Did not pass out or hit her head. EMS brought her to the ED.  In the ED, patient was hemodynamically stable. Labs with hemoglobin of 10.8, serum bicarb 33, BUN/creatinine 18/1.69, BNP 334. Chest x-ray with cardiomegaly and mild pulmonary vascular congestion. X-ray noted a displaced intertrochanteric left hip fracture with left superior pubic ramus fracture Admitted to hospitalist service Orthopedics consulted.  Subjective: Patient was seen and examined this morning. I found a little more awake today. Urine output of more than 1.5 L in last 24 hours.  However creatinine is not improving. Discussed with son on the phone  Principal Problem:   Closed left hip fracture ([place]) secondary to fall at home Active Problems:   ANEMIA   HTN (hypertension), benign   Mixed hypercholesterolemia and hypertriglyceridemia   S/P CABG x 5   Chronic diastolic CHF (congestive heart failure) (HCC)   CHB (complete heart block) (HCC)   Diabetes mellitus without complication (HCC)   Chronic respiratory failure (HCC)    Thrombocytopenia (HCC)   Closed fracture of left superior pubic ramus (HCC)   Chronic kidney disease, stage III B (moderate) ([place])   Fall at home, initial encounter   Hypokalemia    Assessment and Plan: AKI on CKD 3B -Baseline creatinine less than 1.7.   -Postoperatively, patient was hypotensive for few hours. Her creatinine has been trending up from the next day. -I had held her diuretics before the surgery.  Postsurgically, I started her on IV fluid.  Despite that creatinine continue to worsen.  Nephrology consultation was obtained on 2/25.  -On 2/26, patient was noted to have anasarca.  Fluid was stopped.  She was started on IV Lasix 80 mg 3 times daily.  Changed to twice daily yesterday.  Continues to make urine.  More than 1.6 L in last 24 hours.  Creatinine continues to rise however.  Defer to nephrology for further management. Recent Labs    01/21/21 1221 02/14/21 0112 03/18/21 1127 04/03/21 1000 04/04/21 0226 04/05/21 0438 04/06/21 0457 04/07/21 0114 04/08/21 0301 04/09/21 0407  BUN 30* 24* 22 18 18  35* 52* 64* 72* 83*  CREATININE 1.84* 1.58* 1.55* 1.69* 1.75* 2.73* 3.80* 4.46* 5.13* 5.71*    Hyperkalemia -Potassium elevated to 5.2 today.  Likely because of AKI.  Currently not on any potassium supplement.  Patient is already on IV Lasix.  I did not give Lokelma today.  Defer to nephrology Recent Labs  Lab 04/05/21 0438 04/06/21 0457 04/07/21 0114 04/08/21 0301 04/09/21 0407  K 4.6 4.5 5.0 4.5  5.2*   Acute metabolic encephalopathy -Patient mostly sleepy.  Primary due to uremia. -Once and if renal function improves, I expect her mental status to improve. -Continue to monitor.  Closed left hip fracture secondary to fall at home Left superior pubic ramus fracture -Patient had a mechanical fall at home impacting the left hip sustaining closed left hip fracture as well as the left superior pubic ramus fracture.   -2/23, patient underwent cephalomedullary nailing of  left hip. -Pain control and DVT prophylaxis per orthopedics  Chronic diastolic CHF Hypertension -Last echocardiogram noted EF of 55 to 60% with grade 1 diastolic dysfunction in 08/348.  Right ventricular size and systolic function normal. -Home meds include beta-blocker, Imdur, torsemide. -Currently all her meds are on hold.   CAD S/P CABG x 5 HLD -Last cardiac catheterization was in 02/2019 where all territories were noted to have adequate coronary flow via bypasses or native vessels with stents. -Continue aspirin, Plavix, beta-blocker, isosorbide mononitrate and Crestor   CHB (complete heart block)    s/p PPM/ICD    Type 2 diabetes mellitus -A1c 6.5 in November 2022 -Home meds include Jardiance 10 mg daily -Currently on hold.  Continue sliding scale insulin. -Blood sugar level remain controlled Recent Labs  Lab 04/07/21 2052 04/08/21 1215 04/08/21 1735 04/08/21 2000 04/09/21 0818  GLUCAP 147* 129* 142* 125* 111*    Acute blood loss anemia  Chronic mild macrocytic anemia -Baseline hemoglobin more than 10.   -Postoperatively, hemoglobin down trended to the lowest of 7 on 2/25.  2 units of PRBCs was given on 2/26.  Hemoglobin above 9.  No active bleeding from elsewhere.  Continue to monitor. Recent Labs    04/05/21 0438 04/06/21 0457 04/07/21 0114 04/08/21 0301 04/09/21 0407  HGB 9.0* 7.0* 7.1* 9.5* 9.0*  MCV 101.0* 98.3 98.4 93.6 93.5    Morbid obesity  -Body mass index is 42.45 kg/m. Patient has been advised to make an attempt to improve diet and exercise patterns to aid in weight loss.  Chronic respiratory failure  OSA/OHS -Continue nasal cannula oxygen 3 L  Constipation -No BM in several days -Because of poor mobility and pain medicines. -Continue Senokot, MiraLAX daily.  Give 1 dose of Dulcolax today.  Mobility: Needs PT eval postprocedure Goals of care   Code Status: Full Code    Nutritional status:  Body mass index is 42.45 kg/m.      Diet:   Diet Order             Diet renal/carb modified with fluid restriction Diet-HS Snack? Nothing; Fluid restriction: 1200 mL Fluid; Room service appropriate? Yes; Fluid consistency: Thin  Diet effective now                   DVT prophylaxis:     Antimicrobials: None Fluid: None Consultants: Orthopedics Family Communication: None at bedside.  Called and updated patient's son Mr. Shaterria Sager this morning.  Status is: Inpatient  Continue in-hospital care because: AKI postprocedure, not improving yet Level of care: Telemetry Surgical   Dispo: The patient is from: Home              Anticipated d/c is to: PT eval obtained.  SNF recommended              Patient currently is not medically stable to d/c.   Difficult to place patient No     Infusions:     Scheduled Meds:  allopurinol  100 mg Oral Daily  aspirin EC  81 mg Oral Daily   Chlorhexidine Gluconate Cloth  6 each Topical Daily   clopidogrel  75 mg Oral Daily   cycloSPORINE  1 drop Both Eyes BID   furosemide  80 mg Intravenous BID   insulin aspart  0-9 Units Subcutaneous TID WC   loratadine  10 mg Oral Daily   midodrine  10 mg Oral TID WC   mometasone-formoterol  2 puff Inhalation BID   pantoprazole  40 mg Oral BID   polyethylene glycol  17 g Oral Daily   polyvinyl alcohol  1 drop Both Eyes Q12H   rosuvastatin  5 mg Oral QPM   senna  1 tablet Oral BID    PRN meds: acetaminophen, bisacodyl, guaiFENesin, HYDROcodone-acetaminophen, ondansetron (ZOFRAN) IV   Antimicrobials: Anti-infectives (From admission, onward)    Start     Dose/Rate Route Frequency Ordered Stop   04/04/21 1423  vancomycin (VANCOCIN) powder  Status:  Discontinued          As needed 04/04/21 1424 04/04/21 1551   04/04/21 1200  vancomycin (VANCOREADY) IVPB 1500 mg/300 mL        1,500 mg 150 mL/hr over 120 Minutes Intravenous On call to O.R. 04/03/21 1241 04/04/21 1529   04/04/21 1145  ceFAZolin (ANCEF) IVPB 2g/100 mL premix  Status:   Discontinued        2 g 200 mL/hr over 30 Minutes Intravenous On call to O.R. 04/04/21 0942 04/04/21 1717       Objective: Vitals:   04/09/21 0818 04/09/21 0855  BP: 138/66   Pulse: 91   Resp: 20   Temp: 98 F (36.7 C)   SpO2: 94% 99%    Intake/Output Summary (Last 24 hours) at 04/09/2021 1044 Last data filed at 04/09/2021 0610 Gross per 24 hour  Intake 160 ml  Output 1650 ml  Net -1490 ml     Filed Weights   04/03/21 1709 04/06/21 0343 04/07/21 0220  Weight: 105.6 kg 107.6 kg 108.7 kg   Weight change:  Body mass index is 42.45 kg/m.   Physical Exam: General exam: Pleasant, elderly morbidly obese Caucasian female.  A little more awake today Skin: No rashes, lesions or ulcers. HEENT: Atraumatic, normocephalic, no obvious bleeding Lungs: Diminished air entry in both bases because of sleepiness, poor respiratory effort. CVS: Regular rate and rhythm, no murmur GI/Abd soft, nontender, distended from obesity, bowel sound present CNS: Mostly sleeping, opens eyes and verbal,.  Able to have short conversation. Psychiatry: Sad affect Extremities:  no calf tenderness  Data Review: I have personally reviewed the laboratory data and studies available.  F/u labs ordered Unresulted Labs (From admission, onward)     Start     Ordered   04/08/21 0500  CBC with Differential/Platelet  Daily,   R     Question:  Specimen collection method  Answer:  Lab=Lab collect   04/07/21 1318   04/08/21 2707  Basic metabolic panel  Daily,   R     Question:  Specimen collection method  Answer:  Lab=Lab collect   04/07/21 1318            Signed, Terrilee Croak, MD [facility] 04/09/2021

## 2021-04-09 NOTE — Progress Notes (Addendum)
Occupational Therapy Treatment Patient Details Name: [name] MRN: [id number] DOB: Nov 28, 1945 Today's Date: 04/09/2021   History of present illness Pt is a 76 y.o. female admitted 04/03/21 after fall at home when leg gave out sustaining L displaced intertrochanteric hip fx. S/p L hip cephalomedullary nail and closed management of superior pubic ramus fx 2/23. PMH includes afib, CAD, CHF, HTN, pacemaker, asthma, DM2, stroke, OSA, cervical sx, back sx, R ankle fx.   OT comments  Pt presented with son in room. Pt noted needed max verbal and tactile cues to respond to therapist but in session noted to increase in ability to respond and keep eyes open when in a sitting position. Pt required mod assist rolling to L side and min to R side in bed with use of rails and max x2 to sit at EOB. Pt then required max to CGA while sitting at EOB. Pt however had difficulties with attending to therapist and required constant cues for positioning. Pt then was able to complete sitting to supine with max assist. Pt currently with functional limitations due to the deficits listed below (see OT Problem List).  Pt will benefit from skilled OT to increase their safety and independence with ADL and functional mobility for ADL to facilitate discharge to venue listed below.     Recommendations for follow up therapy are one component of a multi-disciplinary discharge planning process, led by the attending physician.  Recommendations may be updated based on patient status, additional functional criteria and insurance authorization.    Follow Up Recommendations  Skilled nursing-short term rehab (<3 hours/day)    Assistance Recommended at Discharge Frequent or constant Supervision/Assistance  Patient can return home with the following  Two people to help with walking and/or transfers;Two people to help with bathing/dressing/bathroom;Assist for transportation;Help with stairs or ramp for entrance;Assistance with  cooking/housework   Equipment Recommendations  None recommended by OT;Other (comment)    Recommendations for Other Services      Precautions / Restrictions Precautions Precautions: Fall;Other (comment) Precaution Comments: Wears 3L O2 baseline Restrictions Weight Bearing Restrictions: No LLE Weight Bearing: Weight bearing as tolerated       Mobility Bed Mobility Overal bed mobility: Needs Assistance Bed Mobility: Supine to Sit     Supine to sit: Max assist, +2 for physical assistance, +2 for safety/equipment, HOB elevated Sit to supine: Max assist        Transfers                         Balance Overall balance assessment: Needs assistance Sitting-balance support: Feet supported, Bilateral upper extremity supported Sitting balance-Leahy Scale: Poor Sitting balance - Comments: Pt needed max to CGA with sitting at EOB. Pt need max cues to remain focused to sitting                                   ADL either performed or assessed with clinical judgement   ADL Overall ADL's : Needs assistance/impaired Eating/Feeding: Maximal assistance;Cueing for safety;Cueing for sequencing;Sitting   Grooming: Maximal assistance;Cueing for safety;Cueing for sequencing;Sitting;Bed level   Upper Body Bathing: Maximal assistance;Cueing for safety;Cueing for sequencing;Sitting   Lower Body Bathing: Total assistance;Cueing for safety;Cueing for sequencing;Bed level   Upper Body Dressing : Maximal assistance;Cueing for safety;Cueing for sequencing;Sitting   Lower Body Dressing: Total assistance;Bed level  Extremity/Trunk Assessment Upper Extremity Assessment Upper Extremity Assessment: RUE deficits/detail;LUE deficits/detail RUE Deficits / Details: Pt noted decrease in coordination of movements and needs max cues to be able to use. Edema in BUE LUE Deficits / Details: Pt noted decrease in coordination of movements and needs max  cues to be able to use. Edema in BUE   Lower Extremity Assessment Lower Extremity Assessment: Defer to PT evaluation LLE Deficits / Details: s/p L hip IMN        Vision   Vision Assessment?: No apparent visual deficits   Perception     Praxis      Cognition Arousal/Alertness: Lethargic Behavior During Therapy: Flat affect Overall Cognitive Status: Impaired/Different from baseline Area of Impairment: Following commands, Safety/judgement, Awareness, Problem solving                       Following Commands: Follows one step commands inconsistently Safety/Judgement: Decreased awareness of safety, Decreased awareness of deficits   Problem Solving: Slow processing, Decreased initiation, Difficulty sequencing, Requires verbal cues, Requires tactile cues General Comments: Pt's son reported this is the most active and awake the pt has been in two days        Exercises      Shoulder Instructions       General Comments      Pertinent Vitals/ Pain       Pain Assessment Pain Assessment: Faces Faces Pain Scale: Hurts little more Breathing: normal Negative Vocalization: none Facial Expression: smiling or inexpressive Body Language: relaxed Consolability: no need to console PAINAD Score: 0 Facial Expression: Relaxed, neutral Body Movements: Absence of movements Muscle Tension: Relaxed Compliance with ventilator (intubated pts.): N/A Vocalization (extubated pts.): N/A CPOT Total: 0 Pain Location: LLE Pain Descriptors / Indicators: Grimacing, Guarding, Moaning Pain Intervention(s): Monitored during session  Home Living                                          Prior Functioning/Environment              Frequency  Min 2X/week        Progress Toward Goals  OT Goals(current goals can now be found in the care plan section)  Progress towards OT goals: Progressing toward goals  Acute Rehab OT Goals Patient Stated Goal: pt unable to  report at this time OT Goal Formulation: With patient Time For Goal Achievement: 04/20/21 Potential to Achieve Goals: Fair ADL Goals Pt Will Perform Upper Body Dressing: with min assist;sitting Pt Will Perform Lower Body Dressing: with adaptive equipment;with mod assist;sitting/lateral leans Pt Will Transfer to Toilet: with mod assist;with +2 assist;stand pivot transfer;bedside commode  Plan Discharge plan remains appropriate    Co-evaluation                 AM-PAC OT "6 Clicks" Daily Activity     Outcome Measure   Help from another person eating meals?: A Lot Help from another person taking care of personal grooming?: A Lot Help from another person toileting, which includes using toliet, bedpan, or urinal?: Total Help from another person bathing (including washing, rinsing, drying)?: A Lot Help from another person to put on and taking off regular upper body clothing?: A Lot Help from another person to put on and taking off regular lower body clothing?: Total 6 Click Score: 10    End of Session  OT Visit Diagnosis: Unsteadiness on feet (R26.81);Other abnormalities of gait and mobility (R26.89);Muscle weakness (generalized) (M62.81)   Activity Tolerance Patient limited by lethargy   Patient Left in bed;with call bell/phone within reach;with family/visitor present   Nurse Communication Mobility status        Time: 3546-5681 OT Time Calculation (min): 31 min  Charges: OT General Charges $OT Visit: 1 Visit OT Treatments $Self Care/Home Management : 23-37 mins  Joeseph Amor OTR/L  Acute Rehab Services  [phone] office number [phone] pager number   Joeseph Amor 04/09/2021, 12:05 PM

## 2021-04-09 NOTE — Progress Notes (Signed)
[facility] Progress Note  Subjective: Met with son and dtr in law at bedside, updated Remains somnolent but awakens, does participate with therapy SCr up to 5.7, K 5.2 1.7L UOP, increased from prev day On BID lasix at this time  Vitals:   04/08/21 2042 04/09/21 0348 04/09/21 0818 04/09/21 0855  BP:  (!) 105/49 138/66   Pulse:  82 91   Resp:  20 20   Temp:  97.7 F (36.5 C) 98 F (36.7 C)   TempSrc:  Axillary Oral   SpO2: 95% 96% 94% 99%  Weight:      Height:        Exam: Gen somnolent, deconditioned, no distress, awakes to voice or stimulus rhonchorus RRR no RG Abd soft obese ntnd no mass or ascites +bs MS no joint effusions or deformity Ext trace LE/ UE edema Neuro Ox 3 , nf      Home meds include - zyloprim, asa, bisoprolol 75 qd, suymbicort, plavix, colace, jardiance, imdur, protonix, klorcon 20 qd, crestor, demadex 60 - 80 bid, prns/ vits/ supps       UA 2/25 cloudy, large Hb, 100 prot, 0-5 rbd, 6-10 wbc     Una 14,  UCr 63    Renal US showed echogenic kidneys w/o hydro (10.8/ 9.8 cm) w/ multiple cysts bilat and bilat cortical thinning      Aug - dec 2022 = 1.3- 1.8, eGFR 28- 44, CKD 3b       CXR 2/22 mild vasc congestion         alb 2.9     Assessment/ Plan: AKI on CKD 3b - b/l creat 1.3- 1.8, eGFR 28- 44, CKD 3b from aug - dec 2022. Sees [place] at [facility].  Creat here 1.6 on admission 2/22 in setting of a fall w/ hip fracture and surgery. Has had low BP's post op. Suspect ATN. Renal US showed no obstruction and echogenic kidneys. UA unremarkable. Started midodrine. Nonoliguric but SCr cont to inc.  Hold lasix today.  No indication for RRT.  She is at risk for needing HD in next few days, family aware.  Inc UOP is encouraging for GFR recovery. Will follow closely.  L hip fracture - sp nailing L hip 2/23 per ortho Anemia - d/t blood loss. Per primary HTN - bp's soft to low-normal . Holding home BB, imdur and demadex CAD sp CABG SP PPM/  ICD DM2 Deconditioning   Rexene Agent, MD  04/09/2021, 12:55 PM   Recent Labs  Lab 04/03/21 1000 04/04/21 0226 04/08/21 0301 04/09/21 0407  K 3.4*   < > 4.5 5.2*  BUN 18   < > 72* 83*  CREATININE 1.69*   < > 5.13* 5.71*  ALBUMIN 2.9*  --   --   --   CALCIUM 8.7*   < > 8.0* 7.7*  HGB 10.8*   < > 9.5* 9.0*   < > = values in this interval not displayed.    Inpatient medications:  allopurinol  100 mg Oral Daily   aspirin EC  81 mg Oral Daily   Chlorhexidine Gluconate Cloth  6 each Topical Daily   clopidogrel  75 mg Oral Daily   cycloSPORINE  1 drop Both Eyes BID   insulin aspart  0-9 Units Subcutaneous TID WC   loratadine  10 mg Oral Daily   midodrine  10 mg Oral TID WC   mometasone-formoterol  2 puff Inhalation BID   pantoprazole  40 mg Oral BID  polyethylene glycol  17 g Oral Daily   polyvinyl alcohol  1 drop Both Eyes Q12H   rosuvastatin  5 mg Oral QPM   senna  1 tablet Oral BID    acetaminophen, bisacodyl, guaiFENesin, HYDROcodone-acetaminophen, ondansetron (ZOFRAN) IV

## 2021-04-09 NOTE — Progress Notes (Signed)
° °  Subjective:  Patient increasingly lethargic with rising creatinine. Continues to make urine. Ambulation limited due to mental status  Objective:   VITALS:   Vitals:   04/08/21 1640 04/08/21 1949 04/08/21 2042 04/09/21 0348  BP: (!) 113/51 135/63  (!) 105/49  Pulse:  92  82  Resp:  (!) 21  20  Temp:  98 F (36.7 C)  97.7 F (36.5 C)  TempSrc:  Oral  Axillary  SpO2:  99% 95% 96%  Weight:      Height:      Dressing is clean dry intact.  Is able to dorsiflex plantarflex the left foot.  Fires EHL gastrocsoleus.  Sensation is intact throughout.  2+ dorsalis pedis pulse  Lab Results  Component Value Date   WBC 7.4 04/09/2021   HGB 9.0 (L) 04/09/2021   HCT 28.6 (L) 04/09/2021   MCV 93.5 04/09/2021   PLT 133 (L) 04/09/2021     Assessment/Plan:  Status post left hip cephalomedullary nail 2/23, acute blood loss anemia corrected s/p transfusion  - Continue to monitor anemia, vitals - Appreciate medical management/work-up for mental status - Patient to work with PT/OT to optimize mobilization safely when able to safely do so - DVT ppx - SCDs, ambulation, okay with home anticoagulation aspirin Plavix - WBAT operative extremity  Quintina Hakeem 04/09/2021, 6:34 AM

## 2021-04-09 NOTE — Progress Notes (Signed)
Remote pacemaker transmission.   

## 2021-04-10 ENCOUNTER — Encounter (HOSPITAL_COMMUNITY): Payer: Self-pay | Admitting: Internal Medicine

## 2021-04-10 DIAGNOSIS — S72002A Fracture of unspecified part of neck of left femur, initial encounter for closed fracture: Secondary | ICD-10-CM | POA: Diagnosis not present

## 2021-04-10 LAB — CBC WITH DIFFERENTIAL/PLATELET
Abs Immature Granulocytes: 0.07 10*3/uL (ref 0.00–0.07)
Basophils Absolute: 0 10*3/uL (ref 0.0–0.1)
Basophils Relative: 0 %
Eosinophils Absolute: 0.1 10*3/uL (ref 0.0–0.5)
Eosinophils Relative: 1 %
HCT: 31.3 % — ABNORMAL LOW (ref 36.0–46.0)
Hemoglobin: 9.9 g/dL — ABNORMAL LOW (ref 12.0–15.0)
Immature Granulocytes: 1 %
Lymphocytes Relative: 5 %
Lymphs Abs: 0.4 10*3/uL — ABNORMAL LOW (ref 0.7–4.0)
MCH: 29.7 pg (ref 26.0–34.0)
MCHC: 31.6 g/dL (ref 30.0–36.0)
MCV: 94 fL (ref 80.0–100.0)
Monocytes Absolute: 0.8 10*3/uL (ref 0.1–1.0)
Monocytes Relative: 10 %
Neutro Abs: 6.6 10*3/uL (ref 1.7–7.7)
Neutrophils Relative %: 83 %
Platelets: 176 10*3/uL (ref 150–400)
RBC: 3.33 MIL/uL — ABNORMAL LOW (ref 3.87–5.11)
RDW: 15.4 % (ref 11.5–15.5)
WBC: 7.9 10*3/uL (ref 4.0–10.5)
nRBC: 0.5 % — ABNORMAL HIGH (ref 0.0–0.2)

## 2021-04-10 LAB — BASIC METABOLIC PANEL
Anion gap: 16 — ABNORMAL HIGH (ref 5–15)
BUN: 96 mg/dL — ABNORMAL HIGH (ref 8–23)
CO2: 26 mmol/L (ref 22–32)
Calcium: 8.2 mg/dL — ABNORMAL LOW (ref 8.9–10.3)
Chloride: 95 mmol/L — ABNORMAL LOW (ref 98–111)
Creatinine, Ser: 6.4 mg/dL — ABNORMAL HIGH (ref 0.44–1.00)
GFR, Estimated: 6 mL/min — ABNORMAL LOW (ref >60)
Glucose, Bld: 96 mg/dL (ref 70–99)
Potassium: 4.7 mmol/L (ref 3.5–5.1)
Sodium: 137 mmol/L (ref 135–145)

## 2021-04-10 LAB — GLUCOSE, CAPILLARY
Glucose-Capillary: 78 mg/dL (ref 70–99)
Glucose-Capillary: 121 mg/dL — ABNORMAL HIGH (ref 70–99)
Glucose-Capillary: 104 mg/dL — ABNORMAL HIGH (ref 70–99)
Glucose-Capillary: 106 mg/dL — ABNORMAL HIGH (ref 70–99)

## 2021-04-10 MED ORDER — SODIUM CHLORIDE 0.9 % IV SOLN: 100.0000 mL | INTRAVENOUS | Status: DC | PRN

## 2021-04-10 MED ORDER — HEPARIN SODIUM (PORCINE) 1000 UNIT/ML DIALYSIS: 1000.0000 [IU] | INTRAMUSCULAR | Status: DC | PRN

## 2021-04-10 MED ORDER — ALTEPLASE 2 MG IJ SOLR
2.0000 mg | Freq: Once | INTRAMUSCULAR | Status: DC | PRN
  Filled 2021-04-10: qty 2

## 2021-04-10 NOTE — Progress Notes (Signed)
Pt. had no relief from suppository  ?

## 2021-04-10 NOTE — Progress Notes (Signed)
PROGRESS NOTE  [name]  DOB: 13-Nov-1945  PCP: Lujean Amel, MD TDD:[id number]  DOA: 04/03/2021  LOS: 7 days  Hospital Day: 8  Brief narrative: [name] is a 76 y.o. female with PMH significant for DM2, HTN, morbid obesity, OSA, OHS chronically on 2 L oxygen at home, CAD s/p CABG '16 (LIMA to LAD, SVG to D1, SVG to OM1 and OM2, SVG to PDA), HFpEF, complete heart block s/p PPM/ICD, CKD stage III, adrenal insufficiency. Patient presented to the ED on 04/03/2021 after a fall at home.  She lives at home with her son, ambulates inside the home without a walker but uses walker to walk.   On 2/22, patient was up and felt her left leg suddenly gave out and she fell on the floor landing on her left side and started immediately hurting in the left hip and leg.  Denies any premonition symptoms like chest pain, shortness of breath, palpitation, dizziness, nausea, vomiting.  Did not pass out or hit her head. EMS brought her to the ED.  In the ED, patient was hemodynamically stable. X-ray noted a displaced intertrochanteric left hip fracture with left superior pubic ramus fracture Admitted to hospitalist service Orthopedics consulted. On 2/23, patient underwent cephalomedullary nailing of left hip. Patient's hospital course got complicated by progressive AKI requiring dialysis.  Subjective: Patient was seen and examined this morning. Awake for conversation but falls back to sleep right away. Urine output less than 1000 mL in last 24 hours.  Creatinine further up. Noted nephrology follow-up in Noted nephrology follow-up and a plan to start dialysis.  Principal Problem:   Closed left hip fracture ([place]) secondary to fall at home Active Problems:   ANEMIA   HTN (hypertension), benign   Mixed hypercholesterolemia and hypertriglyceridemia   S/P CABG x 5   Chronic diastolic CHF (congestive heart failure) (HCC)   CHB (complete heart block) (HCC)   Diabetes mellitus without  complication (HCC)   Chronic respiratory failure (HCC)   Thrombocytopenia (HCC)   Closed fracture of left superior pubic ramus (HCC)   Chronic kidney disease, stage III B (moderate) ([place])   Fall at home, initial encounter   Hypokalemia    Assessment and Plan: AKI on CKD 3B -Baseline creatinine less than 1.7.   -Postoperatively, patient's creatinine started to worsen.  She was started on IV fluid with no benefit.  Nephrology was consulted.  IV Lasix was tried after which patient started to make urine but creatinine continued to rise up.  She is getting more uremic now. -Today, nephrology has made a decision to initiate her on dialysis.  Family on board. Recent Labs    02/14/21 0112 03/18/21 1127 04/03/21 1000 04/04/21 0226 04/05/21 0438 04/06/21 0457 04/07/21 0114 04/08/21 0301 04/09/21 0407 04/10/21 0523  BUN 24* 22 18 18  35* 52* 64* 72* 83* 96*  CREATININE 1.58* 1.55* 1.69* 1.75* 2.73* 3.80* 4.46* 5.13* 5.71* 6.40*    Hyperkalemia -Potassium elevated to 5.2 yesterday. It is at 4.7 today.  Likely because of AKI.   Recent Labs  Lab 04/06/21 0457 04/07/21 0114 04/08/21 0301 04/09/21 0407 04/10/21 0523  K 4.5 5.0 4.5 5.2* 4.7    Acute metabolic encephalopathy -Patient mostly sleepy.  Primarily due to uremia. -Once the uremia improves improves, I expect her mental status to improve. -Continue to monitor.  Closed left hip fracture secondary to fall at home Left superior pubic ramus fracture -Patient had a mechanical fall at home impacting the left hip sustaining closed  left hip fracture as well as the left superior pubic ramus fracture.   -2/23, patient underwent cephalomedullary nailing of left hip. -Pain control and DVT prophylaxis per orthopedics  Chronic diastolic CHF Hypertension -Last echocardiogram noted EF of 55 to 60% with grade 1 diastolic dysfunction in 06/6312.  Right ventricular size and systolic function normal. -Home meds include beta-blocker, Imdur,  torsemide. -Currently all her meds are on hold.   CAD S/P CABG x 5 HLD -Last cardiac catheterization was in 02/2019 where all territories were noted to have adequate coronary flow via bypasses or native vessels with stents. -Continue aspirin, Plavix, beta-blocker, isosorbide mononitrate and Crestor   CHB (complete heart block)    s/p PPM/ICD    Type 2 diabetes mellitus -A1c 6.5 in November 2022 -Home meds include Jardiance 10 mg daily -Currently on hold.  Continue sliding scale insulin. -Blood sugar level remain controlled Recent Labs  Lab 04/09/21 0818 04/09/21 1228 04/09/21 1645 04/10/21 0804 04/10/21 1129  GLUCAP 111* 112* 118* 78 121*    Acute blood loss anemia  Chronic mild macrocytic anemia -Baseline hemoglobin more than 10.   -Postoperatively, hemoglobin down trended to the lowest of 7 on 2/25.  2 units of PRBCs was given on 2/26.  Hemoglobin above 9.  No active bleeding from elsewhere.  Continue to monitor. Recent Labs    04/06/21 0457 04/07/21 0114 04/08/21 0301 04/09/21 0407 04/10/21 0523  HGB 7.0* 7.1* 9.5* 9.0* 9.9*  MCV 98.3 98.4 93.6 93.5 94.0    Morbid obesity  -Body mass index is 42.45 kg/m. Patient has been advised to make an attempt to improve diet and exercise patterns to aid in weight loss.  Chronic respiratory failure  OSA/OHS -Continue nasal cannula oxygen 3 L  Constipation -Due to pain meds, immobility. -Continue Senokot, MiraLAX daily, PRN Dulcolax  Mobility: Needs PT eval postprocedure Goals of care   Code Status: Full Code    Nutritional status:  Body mass index is 42.45 kg/m.      Diet:  Diet Order             Diet NPO time specified  Diet effective now                   DVT prophylaxis:     Antimicrobials: None Fluid: None Consultants: Orthopedics Family Communication: None at bedside.   Status is: Inpatient  Continue in-hospital care because: AKI postprocedure, likely heading to dialysis Level of care:  Telemetry Surgical   Dispo: The patient is from: Home              Anticipated d/c is to: PT eval obtained.  SNF recommended              Patient currently is not medically stable to d/c.   Difficult to place patient No     Infusions:   sodium chloride     sodium chloride       Scheduled Meds:  allopurinol  100 mg Oral Daily   aspirin EC  81 mg Oral Daily   Chlorhexidine Gluconate Cloth  6 each Topical Daily   clopidogrel  75 mg Oral Daily   cycloSPORINE  1 drop Both Eyes BID   insulin aspart  0-9 Units Subcutaneous TID WC   loratadine  10 mg Oral Daily   midodrine  10 mg Oral TID WC   mometasone-formoterol  2 puff Inhalation BID   pantoprazole  40 mg Oral BID   polyethylene glycol  17 g  Oral Daily   polyvinyl alcohol  1 drop Both Eyes Q12H   rosuvastatin  5 mg Oral QPM   senna  1 tablet Oral BID    PRN meds: sodium chloride, sodium chloride, acetaminophen, alteplase, bisacodyl, guaiFENesin, heparin, HYDROcodone-acetaminophen, ondansetron (ZOFRAN) IV   Antimicrobials: Anti-infectives (From admission, onward)    Start     Dose/Rate Route Frequency Ordered Stop   04/04/21 1423  vancomycin (VANCOCIN) powder  Status:  Discontinued          As needed 04/04/21 1424 04/04/21 1551   04/04/21 1200  vancomycin (VANCOREADY) IVPB 1500 mg/300 mL        1,500 mg 150 mL/hr over 120 Minutes Intravenous On call to O.R. 04/03/21 1241 04/04/21 1529   04/04/21 1145  ceFAZolin (ANCEF) IVPB 2g/100 mL premix  Status:  Discontinued        2 g 200 mL/hr over 30 Minutes Intravenous On call to O.R. 04/04/21 0942 04/04/21 1717       Objective: Vitals:   04/10/21 0728 04/10/21 0854  BP: (!) 142/58   Pulse:    Resp:    Temp: 97.9 F (36.6 C)   SpO2:  100%    Intake/Output Summary (Last 24 hours) at 04/10/2021 1305 Last data filed at 04/10/2021 1025 Gross per 24 hour  Intake 320 ml  Output 1200 ml  Net -880 ml     Filed Weights   04/03/21 1709 04/06/21 0343 04/07/21 0220   Weight: 105.6 kg 107.6 kg 108.7 kg   Weight change:  Body mass index is 42.45 kg/m.   Physical Exam: General exam: Pleasant, elderly morbidly obese Caucasian female.  Not in physical distress skin: No rashes, lesions or ulcers. HEENT: Atraumatic, normocephalic, no obvious bleeding Lungs: Diminished air entry in both bases because of sleepiness, poor respiratory effort. CVS: Regular rate and rhythm, no murmur GI/Abd soft, nontender, distended from obesity, bowel sound present CNS: Mostly sleeping, opens eyes and verbal,.  Able to have short conversation. Psychiatry: Sad affect Extremities:  no calf tenderness  Data Review: I have personally reviewed the laboratory data and studies available.  F/u labs ordered Unresulted Labs (From admission, onward)     Start     Ordered   04/10/21 1229  Hepatitis B surface antigen  (New Admission Hemo Labs (Hepatitis B))  Once,   R       Question:  Specimen collection method  Answer:  Lab=Lab collect   04/10/21 1228   04/10/21 1229  Hepatitis B surface antibody  (New Admission Hemo Labs (Hepatitis B))  Once,   R       Question:  Specimen collection method  Answer:  Lab=Lab collect   04/10/21 1228   04/10/21 1229  Hepatitis B surface antibody,quantitative  (New Admission Hemo Labs (Hepatitis B))  Once,   R       Question:  Specimen collection method  Answer:  Lab=Lab collect   04/10/21 1228            Signed, Terrilee Croak, MD [facility] 04/10/2021

## 2021-04-10 NOTE — Progress Notes (Signed)
[facility] ?Progress Note ? ?Subjective: ?More awake when I saw her today ?Still on [place] ?SCr up to 6.5, K 4.7 ?0.9L UOP ?Given her comorbidities and ongoing AKI, recommended starting HD, first on a temporary bases, as I hope for GFR recovery ?She is in agreement, aware of need for [facility] ?Son, [name], called by phone, updated and also expressed agreement with plan ? ?Vitals:  ? 04/10/21 5449 04/10/21 2010 04/10/21 0712 04/10/21 0854  ?BP:  (!) 142/60 (!) 142/58   ?Pulse: 95 92    ?Resp: (!) 22 20    ?Temp:  97.6 ?F (36.4 ?C) 97.9 ?F (36.6 ?C)   ?TempSrc:  Oral Oral   ?SpO2: 97% 96%  100%  ?Weight:      ?Height:      ? ? ?Exam: ?Gen obese, somnolent, deconditioned, no distress, awake ?rhonchorus ?RRR no RG ?Abd soft obese ntnd no mass or ascites +bs ?MS no joint effusions or deformity ?Ext trace LE/ UE edema ?Neuro Ox 3 , nf ?  ?  ? Home meds include - zyloprim, asa, bisoprolol 75 qd, suymbicort, plavix, colace, jardiance, imdur, protonix, klorcon 20 qd, crestor, demadex 60 - 80 bid, prns/ vits/ supps ?  ?    UA 2/25 cloudy, large Hb, 100 prot, 0-5 rbd, 6-10 wbc ?    Una 14,  UCr 63 ?   Renal US showed echogenic kidneys w/o hydro (10.8/ 9.8 cm) w/ multiple cysts bilat and bilat cortical thinning ?     Aug - dec 2022 = 1.3- 1.8, eGFR 28- 44, CKD 3b ?      CXR 2/22 mild vasc congestion  ?       alb 2.9 ?  ?  ?Assessment/ Plan: ?AKI on CKD 3b -  ?b/l creat 1.3- 1.8, eGFR 28- 44, CKD 3b from aug - dec 2022. Sees [place] at [facility].   ?Creat here 1.6 on admission 2/22 in setting of a fall w/ hip fracture and surgery.  ?Has had low BP's post op. Suspect ATN.  ?Renal US showed no obstruction and echogenic kidneys. UA unremarkable.  ?Started midodrine. Nonoliguric but SCr cont to inc.   ?Now severe and plan to start HD after TDC placed. ?HD#1: 2.5h, 250/500, 1L UF, 2K, 2.5Ca, post TDC ?L hip fracture - sp nailing L hip 2/23 per ortho ?Anemia - d/t blood loss. Stable in 9s ?HTN - bp's soft to low-normal . Holding  home BB, imdur and demadex ?CAD sp CABG on DAPT ?SP PPM/ ICD ?DM2 ?Deconditioning ?AHRF on 4L [place] ?  ?Rexene Agent, MD  ?04/10/2021, 12:03 PM ? ? ?Recent Labs  ?Lab 04/09/21 ?0407 04/10/21 ?1975  ?K 5.2* 4.7  ?BUN 83* 96*  ?CREATININE 5.71* 6.40*  ?CALCIUM 7.7* 8.2*  ?HGB 9.0* 9.9*  ? ? ?Inpatient medications: ? allopurinol  100 mg Oral Daily  ? aspirin EC  81 mg Oral Daily  ? Chlorhexidine Gluconate Cloth  6 each Topical Daily  ? clopidogrel  75 mg Oral Daily  ? cycloSPORINE  1 drop Both Eyes BID  ? insulin aspart  0-9 Units Subcutaneous TID WC  ? loratadine  10 mg Oral Daily  ? midodrine  10 mg Oral TID WC  ? mometasone-formoterol  2 puff Inhalation BID  ? pantoprazole  40 mg Oral BID  ? polyethylene glycol  17 g Oral Daily  ? polyvinyl alcohol  1 drop Both Eyes Q12H  ? rosuvastatin  5 mg Oral QPM  ? senna  1  tablet Oral BID  ? ? ?acetaminophen, bisacodyl, guaiFENesin, HYDROcodone-acetaminophen, ondansetron (ZOFRAN) IV ? ? ? ? ? ? ?

## 2021-04-10 NOTE — Consult Note (Signed)
Chief Complaint: Renal failure  Referring Physician(s): Joelyn Oms  Supervising Physician: Ruthann Cancer  Patient Status: [facility] - [facility]  History of Present Illness: [name] is a 76 y.o. female who sustained a fall and suffered a hip fracture.  She underwent orthopedic surgery for repair with blood loss anemia requiring transfusion.  She had some hypotension which likely let to acute renal failure with history of known CKD stage 3.   Her creatinine has continued to worsen. Creatinine on admission was 1.69 and is now up to 6.40.  She now needs hemodialysis.  We are asked to place a tunneled hemodialysis catheter.  She ate breakfast at 8 am.   Past Medical History:  Diagnosis Date   ABDOMINAL PAIN -GENERALIZED 02/21/2010   Qualifier: Diagnosis of  By: Chester Holstein NP, Nevin Bloodgood     AICD (automatic cardioverter/defibrillator) present    downgraded to CRT-P in 2014   Anemia    Arthritis    Asthma    Atrial fibrillation ([place]) 10/24/2015   Questionable history of A Fib/A Flutter. On device interrogation on 9/7, showed 0.23min of A Fib/A Flutter with 1% burden.  Device check in Jan 2018 showed no sustained AF (runs of less than 30 seconds). No anticoagulation indicated.   CAD (coronary artery disease)    a. 10/09/16 LHC: SVG->LAD patent, SVG->Diag patent, SVG->RCA patent, SVG->LCx occluded. EF 60%, b. 10/31/16 LHC DES to AV groove Circ, DES to intermed branch   Cellulitis and abscess of foot 12/2014   RT FOOT   CHF (congestive heart failure) (HCC)    Chronic bronchitis (HCC)    Chronic lower back pain    Complete heart block (HCC) 06/22/2015   Diverticulosis    Facial numbness 02/2016   Fatty liver disease, nonalcoholic    Gastritis    GERD (gastroesophageal reflux disease)    Gout    H/O hiatal hernia    HEMORRHOIDS-EXTERNAL 02/21/2010   Qualifier: Diagnosis of  By: Chester Holstein NP, Paula     High cholesterol    Hyperplastic colon polyp    Hypertension    IBS (irritable  bowel syndrome)    Internal hemorrhoids    INTERNAL HEMORRHOIDS WITHOUT MENTION COMP 04/12/2007   Qualifier: Diagnosis of  By: Olevia Perches MD, Dora M    Nonischemic cardiomyopathy ([place]) 11/22/2011   Pt responded to BiV ICD- last EF 55-60% Sept 2017   Obesity    On home oxygen therapy    "2L at night" (10/31/2016)   OSA (obstructive sleep apnea)    "can't tolerate a mask" (10/30/2016)   PERSONAL HX COLONIC POLYPS 02/21/2010   Qualifier: Diagnosis of  By: Chester Holstein NP, Paula     Pneumonia    "couple times" (10/31/2016)   Presence of permanent cardiac pacemaker    11/02/12 [employer] V273 INTUA PPM   Right facial numbness 03/05/2016   Shortness of breath    Stroke ([place]) 09/2020   TIA (transient ischemic attack)    "recently" (10/31/2016)   Type II diabetes mellitus ([place])     Past Surgical History:  Procedure Laterality Date   ABDOMINAL ULTRASOUND  12/01/2011   Peripelvic cysts- #1- 2.4x1.9x2.3cm, #2-1.2x0.9x1.2cm   ANKLE FRACTURE SURGERY Right    "had rod put in"   ANTERIOR CERVICAL DECOMP/DISCECTOMY FUSION     APPENDECTOMY     BACK SURGERY     BIOPSY  12/29/2017   Procedure: BIOPSY;  Surgeon: [name], MD;  [facility]: [facility];  Service: Endoscopy;;   BIV  ICD INSERTION CRT-D  2001?   BIV PACEMAKER GENERATOR CHANGE OUT N/A 11/02/2012   Procedure: BIV PACEMAKER GENERATOR CHANGE OUT;  Surgeon: Sanda Klein, MD;  Location: [place] [facility];  Service: Cardiovascular;  Laterality: N/A;   CARDIAC CATHETERIZATION  05/17/1999   No significant coronary obstructive disease w/ mild 20% luminal irregularity of the first diag branch of the LAD   CARDIAC CATHETERIZATION  07/08/2002   No significant CAD, moderately depressed LV systolic function   CARDIAC CATHETERIZATION Bilateral 04/26/2007   Normal findings, recommend medical therapy   CARDIAC CATHETERIZATION  02/18/2008   Moderate CAD, would benefit from having a functional study, recommend continue medical therapy   CARDIAC  CATHETERIZATION  07/23/2012   Medical therapy   CARDIAC CATHETERIZATION N/A 11/24/2014   Procedure: Left Heart Cath and Coronary Angiography;  Surgeon: Troy Sine, MD;  Location: [place] [facility];  Service: Cardiovascular;  Laterality: N/A;   CARDIAC CATHETERIZATION  11/27/2014   Procedure: Intravascular Pressure Wire/FFR Study;  Surgeon: Peter M Martinique, MD;  Location: [place] [facility];  Service: Cardiovascular;;   CARDIAC CATHETERIZATION  10/09/2016   CHOLECYSTECTOMY N/A 04/09/2018   Procedure: LAPAROSCOPIC CHOLECYSTECTOMY;  Surgeon: Greer Pickerel, MD;  [facility];  Service: General;  Laterality: N/A;   COLONOSCOPY WITH PROPOFOL N/A 12/29/2017   Procedure: COLONOSCOPY WITH PROPOFOL;  Surgeon: [name], MD;  [facility];  Service: Endoscopy;  Laterality: N/A;   CORONARY ANGIOGRAM  2010   CORONARY ARTERY BYPASS GRAFT N/A 11/29/2014   Procedure: CORONARY ARTERY BYPASS GRAFTING x 5 (LIMA-LAD, SVG-D, SVG-OM1-OM2, SVG-PD);  Surgeon: Melrose Nakayama, MD;  Location: [facility];  Service: Open Heart Surgery;  Laterality: N/A;   CORONARY STENT INTERVENTION N/A 10/31/2016   Procedure: CORONARY STENT INTERVENTION;  Surgeon: Burnell Blanks, MD;  Location: [place];  Service: Cardiovascular;  Laterality: N/A;   ESOPHAGOGASTRODUODENOSCOPY (EGD) WITH PROPOFOL N/A 12/29/2017   Procedure: ESOPHAGOGASTRODUODENOSCOPY (EGD) WITH PROPOFOL;  Surgeon: [name], MD;  [facility];  Service: Endoscopy;  Laterality: N/A;   FRACTURE SURGERY     INSERT / REPLACE / REMOVE PACEMAKER  1999   INTRAMEDULLARY (IM) NAIL INTERTROCHANTERIC Left 04/04/2021   Procedure: INTRAMEDULLARY (IM) NAIL INTERTROCHANTRIC;  Surgeon: Vanetta Mulders, MD;  Location: [facility] [facility];  Service: Orthopedics;  Laterality: Left;   KNEE ARTHROSCOPY Bilateral    LEFT HEART CATH AND CORS/GRAFTS ANGIOGRAPHY N/A 12/09/2017   Procedure: LEFT HEART CATH AND CORS/GRAFTS ANGIOGRAPHY;  Surgeon:  Troy Sine, MD;  Location: [place] [facility];  Service: Cardiovascular;  Laterality: N/A;   LEFT HEART CATHETERIZATION WITH CORONARY ANGIOGRAM N/A 07/23/2012   Procedure: LEFT HEART CATHETERIZATION WITH CORONARY ANGIOGRAM;  Surgeon: Leonie Man, MD;  Location: [facility];  Service: Cardiovascular;  Laterality: N/A;   LEXISCAN MYOVIEW  11/14/2011   Mild-moderate defect seen in Mid Inferolateral and Mid Anterolateral regions-consistant w/ infarct/scar. No significant ischemia demonstrated.   POLYPECTOMY  12/29/2017   Procedure: POLYPECTOMY;  Surgeon: [name], MD;  Location: [facility];  Service: Endoscopy;;   PPM GENERATOR CHANGEOUT N/A 12/31/2020   Procedure: PPM GENERATOR CHANGEOUT;  Surgeon: Sanda Klein, MD;  Location: [place] [facility];  Service: Cardiovascular;  Laterality: N/A;   RIGHT/LEFT HEART CATH AND CORONARY ANGIOGRAPHY N/A 10/09/2016   Procedure: RIGHT/LEFT HEART CATH AND CORONARY ANGIOGRAPHY;  Surgeon: Jolaine Artist, MD;  Location: [place] [facility];  Service: Cardiovascular;  Laterality: N/A;   RIGHT/LEFT HEART CATH AND CORONARY/GRAFT ANGIOGRAPHY  N/A 03/11/2019   Procedure: RIGHT/LEFT HEART CATH AND CORONARY/GRAFT ANGIOGRAPHY;  Surgeon: Jolaine Artist, MD;  Location: [place];  Service: Cardiovascular;  Laterality: N/A;   TEE WITHOUT CARDIOVERSION N/A 11/29/2014   Procedure: TRANSESOPHAGEAL ECHOCARDIOGRAM (TEE);  Surgeon: Melrose Nakayama, MD;  Location: [facility];  Service: Open Heart Surgery;  Laterality: N/A;   TRANSCAROTID ARTERY REVASCULARIZATION  Right 10/01/2020   Procedure: RIGHT TRANSCAROTID ARTERY REVASCULARIZATION;  Surgeon: [name], MD;  Location: [facility];  Service: Vascular;  Laterality: Right;   TRANSTHORACIC ECHOCARDIOGRAM  07/23/2012   EF 55-60%, normal-mild   TUBAL LIGATION     ULTRASOUND GUIDANCE FOR VASCULAR ACCESS Left 10/01/2020   Procedure: ULTRASOUND GUIDANCE FOR VASCULAR ACCESS;  Surgeon: [name], MD;  Location: [facility];  Service: Vascular;  Laterality: Left;    Allergies: Ivp dye [iodinated contrast media], Shellfish allergy, Sulfa antibiotics, Iodine, Atorvastatin, Benadryl [diphenhydramine], Colchicine, Contrast media [iodinated contrast media], Doxycycline, Uloric [febuxostat], Cephalexin, and Zithromax [azithromycin]  Medications: Prior to Admission medications   Medication Sig Start Date End Date Taking? Authorizing Provider  acetaminophen (TYLENOL) 500 MG tablet Take 1,000 mg by mouth 2 (two) times daily as needed (for pain or headaches).   Yes [provider]  albuterol (PROVENTIL HFA;VENTOLIN HFA) 108 (90 Base) MCG/ACT inhaler Inhale 2 puffs into the lungs every 6 (six) hours as needed for wheezing or shortness of breath. 05/02/18  Yes Fuller Plan A, MD  allopurinol (ZYLOPRIM) 100 MG tablet Take 1 tablet (100 mg total) by mouth daily. 03/14/21  Yes Croitoru, [name], MD  aspirin EC 81 MG tablet Take 1 tablet (81 mg total) by mouth daily. 03/27/16  Yes Kilroy, Luke K, PA-C  bisoprolol (ZEBETA) 5 MG tablet Take 1.5 tablets (7.5 mg total) by mouth daily. 03/18/21  Yes Milford, Maricela Bo, FNP  budesonide-formoterol (SYMBICORT) 160-4.5 MCG/ACT inhaler Inhale 2 puffs into the lungs 2 (two) times daily.   Yes [provider]  carboxymethylcellulose (REFRESH PLUS) 0.5 % SOLN Place 1 drop into both eyes every 12 (twelve) hours.   Yes [provider]  clopidogrel (PLAVIX) 75 MG tablet Take 1 tablet (75 mg total) by mouth daily. 02/28/21  Yes Croitoru, [name], MD  docusate sodium (COLACE) 100 MG capsule Take 100 mg by mouth at bedtime.   Yes [provider]  empagliflozin (JARDIANCE) 10 MG TABS tablet Take 10 mg by mouth daily before breakfast. 06/30/19  Yes Bensimhon, Shaune Pascal, MD  guaiFENesin (MUCINEX) 600 MG 12 hr tablet Take 1 tablet (600 mg total) by mouth 2 (two) times daily. Patient taking differently: Take 600 mg by mouth 2 (two) times daily  as needed for cough. 01/10/21  Yes [name], NP  isosorbide mononitrate (IMDUR) 30 MG 24 hr tablet Take 90 mg by mouth daily.   Yes [provider]  loratadine (CLARITIN) 10 MG tablet Take 10 mg by mouth daily. 11/23/19  Yes [provider]  OXYGEN Inhale 3 L/min into the lungs continuous.    Yes [provider]  oxymetazoline (AFRIN) 0.05 % nasal spray Place 1 spray into both nostrils 2 (two) times daily as needed for congestion.   Yes [provider]  pantoprazole (PROTONIX) 40 MG tablet Take 1 tablet (40 mg total) by mouth 2 (two) times daily. 07/08/19  Yes Nandigam, Venia Minks, MD  polyethylene glycol (MIRALAX / GLYCOLAX) 17 g packet Take 17 g by mouth daily.   Yes [provider]  RESTASIS 0.05 %  ophthalmic emulsion Place 1 drop into both eyes 2 (two) times daily. 03/19/21  Yes [provider]  rosuvastatin (CRESTOR) 40 MG tablet Take 1 tablet (40 mg total) by mouth every evening. NEED OV. 09/19/20  Yes [name], Laxman, MD  torsemide (DEMADEX) 20 MG tablet Take 60 mg by mouth 2 (two) times daily.   Yes [provider]  ACCU-CHEK AVIVA PLUS test strip Check blood sugar twice daily 10/19/19   [provider]  Accu-Chek Softclix Lancets lancets Check blood sugar twice daily 08/10/19   [provider]  albuterol (PROVENTIL) (2.5 MG/3ML) 0.083% nebulizer solution Take 3 mLs (2.5 mg total) by nebulization every 6 (six) hours as needed for wheezing or shortness of breath. Patient not taking: Reported on 04/03/2021 05/02/18   Norval Morton, MD  Alcohol Swabs (B-D SINGLE USE SWABS REGULAR) PADS  08/10/19   [provider]  EPINEPHrine 0.3 mg/0.3 mL IJ SOAJ injection Inject 0.3 mg into the muscle once as needed for anaphylaxis (severe allergic reaction). Patient not taking: Reported on 04/01/2021    [provider]  nitroGLYCERIN (NITROSTAT) 0.4 MG SL tablet Place 1 tablet (0.4 mg total) under the tongue every  5 (five) minutes as needed for chest pain. Patient not taking: Reported on 04/01/2021 11/13/16 03/18/22  Erlene Quan, PA-C  potassium chloride SA (KLOR-CON M) 20 MEQ tablet Take 1 tablet (20 mEq total) by mouth every morning. Patient not taking: Reported on 04/01/2021 03/23/21   Croitoru, Dani Gobble, MD  torsemide 40 MG TABS Take 80 mg by mouth 2 (two) times daily. Patient not taking: Reported on 04/03/2021 04/01/21 06/30/21  Croitoru, Dani Gobble, MD     Family History  Problem Relation Age of Onset   Breast cancer Mother    Diabetes Mother    Heart disease Maternal Grandmother    Kidney disease Maternal Grandmother    Diabetes Maternal Grandmother    Glaucoma Maternal Aunt    Heart disease Maternal Aunt    Asthma Sister    Colon cancer Neg Hx    Stomach cancer Neg Hx    Pancreatic cancer Neg Hx     Social History   Socioeconomic History   Marital status: Widowed    Spouse name: Claudell Rhody- deceased   Number of children: 5   Years of education: Not on file   Highest education level: Not on file  Occupational History   Occupation: STAFF/BUFFET    Employer: [employer]  Tobacco Use   Smoking status: Former    Packs/day: 0.25    Years: 3.00    Pack years: 0.75    Types: Cigarettes    Quit date: 02/11/1968    Years since quitting: 53.1   Smokeless tobacco: Never  Vaping Use   Vaping Use: Never used  Substance and Sexual Activity   Alcohol use: No   Drug use: No   Sexual activity: Never  Other Topics Concern   Not on file  Social History Narrative   Widowed last year. Spouse Mike Gip Sr (54) died on date 01/24/12 05/10/2022) at his home Was a [place] [employer] -[place] [employer]   She lives with her two sons, Thayer Ohm    Other sons Glyn Ade, Thayer Jew, [name]   Social Determinants of Health   Financial Resource Strain: Not on [employer] Insecurity: No Food Insecurity   Worried About Charity fundraiser in the Last Year: Never true   [employer] in  the Last Year: Never true  Transportation Needs: No Transportation Needs   Lack of Transportation (Medical): No   Lack of Transportation (Non-Medical): No  Physical Activity: Not on file  Stress: Not on file  Social Connections: Moderately Integrated   Frequency of Communication with Friends and Family: Three times a week   Frequency of Social Gatherings with Friends and Family: Three times a week   Attends Religious Services: 1 to 4 times per year   Active Member of Clubs or Organizations: Yes   Attends Archivist Meetings: 1 to 4 times per year   Marital Status: Widowed     Review of Systems: A 12 point ROS discussed and pertinent positives are indicated in the HPI above.  All other systems are negative.  Review of Systems  Vital Signs: BP (!) 142/58 (BP Location: Right Arm)    Pulse 92    Temp 97.9 F (36.6 C) (Oral)    Resp 20    Ht 5\' 3"  (1.6 m)    Wt 239 lb 10.2 oz (108.7 kg)    SpO2 100%    BMI 42.45 kg/m   Physical Exam Vitals reviewed.  Constitutional:      Appearance: She is obese.  Cardiovascular:     Rate and Rhythm: Normal rate and regular rhythm.  Pulmonary:     Effort: Pulmonary effort is normal. No respiratory distress.     Breath sounds: Normal breath sounds.  Abdominal:     Palpations: Abdomen is soft.  Skin:    General: Skin is warm and dry.  Neurological:     General: No focal deficit present.     Mental Status: She is alert and oriented to person, place, and time.  Psychiatric:        Mood and Affect: Mood normal.        Behavior: Behavior normal.        Thought Content: Thought content normal.        Judgment: Judgment normal.    Imaging: US RENAL  Result Date: 04/06/2021 CLINICAL DATA:  Acute kidney injury. EXAM: RENAL / URINARY TRACT ULTRASOUND COMPLETE COMPARISON:  01/25/2018 and 11/20/2017 FINDINGS: Right Kidney: Renal measurements: 10.8 x 5.9 x 6.4 centimeters = volume: 214.2 mL. Renal parenchyma is echogenic. There is renal  parenchymal thinning. Multiple cysts measure 1 centimeter or less. No solid mass identified. No hydronephrosis. Left Kidney: Renal measurements: 9.8 x 4.9 x 5.4 centimeters = volume: 135.9 mL. Increased renal echogenicity. There is renal parenchymal thinning. Numerous cysts measure up to 1.4 centimeters. No solid mass identified. No hydronephrosis. Bladder: Appears normal for degree of bladder distention. Other: None. IMPRESSION: 1. Bilateral renal parenchymal thinning and renal echogenicity. 2. Small bilateral renal cysts. 3. No suspicious renal mass or hydronephrosis. Electronically Signed   By: Nolon Nations M.D.   On: 04/06/2021 18:34   DG CHEST PORT 1 VIEW  Result Date: 04/06/2021 CLINICAL DATA:  Shortness of breath. EXAM: PORTABLE CHEST 1 VIEW COMPARISON:  Chest radiograph dated 04/03/2021. FINDINGS: Shallow inspiration. Bibasilar streaky atelectasis or infiltrate. Right perihilar density as seen on the prior radiograph. No new consolidation. Small left pleural effusion may be present. No pneumothorax. Stable cardiac silhouette. Atherosclerotic calcification of the aorta. Median sternotomy wires and right sided AICD device. No acute osseous pathology. Osteopenia with degenerative changes of the spine and shoulders. IMPRESSION: 1. Bibasilar streaky atelectasis or infiltrate. 2. Right perihilar density as seen on the prior radiograph. Electronically Signed   By: Anner Crete  M.D.   On: 04/06/2021 19:59   DG Chest Portable 1 View  Result Date: 04/03/2021 CLINICAL DATA:  Fall, hip pain EXAM: PORTABLE CHEST 1 VIEW COMPARISON:  01/07/2021 FINDINGS: Right-sided implanted cardiac device. Stable cardiomegaly. Prior CABG. Aortic atherosclerosis. Mild pulmonary vascular congestion no focal airspace consolidation evident on portable supine view. No large pleural fluid collection. No evidence of a pneumothorax. IMPRESSION: Cardiomegaly with mild pulmonary vascular congestion. Electronically Signed   By:  Davina Poke D.O.   On: 04/03/2021 10:11   DG Knee Left Port  Result Date: 04/03/2021 CLINICAL DATA:  Fall, pain EXAM: PORTABLE LEFT KNEE - 1-2 VIEW COMPARISON:  None. FINDINGS: There is no acute fracture or dislocation. Knee alignment is normal. There is mild medial and lateral compartment joint space narrowing with minimal osteophytosis. A surgical clip is noted in the posterior soft tissues. There is no effusion. IMPRESSION: No acute fracture or dislocation. Electronically Signed   By: Valetta Mole M.D.   On: 04/03/2021 12:02   DG C-Arm 1-60 Min-No Report  Result Date: 04/04/2021 Fluoroscopy was utilized by the requesting physician.  No radiographic interpretation.   DG C-Arm 1-60 Min-No Report  Result Date: 04/04/2021 Fluoroscopy was utilized by the requesting physician.  No radiographic interpretation.   CUP PACEART REMOTE DEVICE CHECK  Result Date: 04/03/2021 Scheduled remote reviewed. Normal device function.  Brief ATR's for FF oversensing Next remote 91 days. LA  DG Hip Unilat W or Wo Pelvis 2-3 Views Left  Result Date: 04/03/2021 CLINICAL DATA:  Fall today.  Left hip pain. EXAM: DG HIP (WITH OR WITHOUT PELVIS) 2-3V LEFT COMPARISON:  None. FINDINGS: A displaced intertrochanteric left hip fracture seen. No evidence of dislocation. Minimally displaced fracture is also seen involving the left superior pubic ramus. IMPRESSION: Displaced intertrochanteric left hip fracture. Left superior pubic ramus fracture. Electronically Signed   By: Marlaine Hind M.D.   On: 04/03/2021 10:34   DG FEMUR MIN 2 VIEWS LEFT  Addendum Date: 04/04/2021   ADDENDUM REPORT: 04/04/2021 16:07 ADDENDUM: Correction to findings as follows: 5 low resolution intraoperative spot views of the left hip and femur were obtained. Interval reduction of the left intertrochanteric fracture with placement of intramedullary nail, lag screw and 2 distal locking screws. Electronically Signed   By: Ileana Roup M.D.   On:  04/04/2021 16:07   Result Date: 04/04/2021 CLINICAL DATA:  Intramedullary nail placement for intratrochanteric left femur fracture. EXAM: LEFT FEMUR 2 VIEWS COMPARISON:  Left hip radiograph 04/03/2021 FINDINGS: Intraoperative IM nail placement. 1 low resolution intraoperative spot views of the left hip were obtained. Interval reduction of the left intertrochanteric fracture. Total fluoroscopy time: 5 minutes 27 seconds Total radiation dose: 132.79 mGy IMPRESSION: Intraoperative imaging for localization purposes. Please see the performing provider's procedure report for further details. Electronically Signed: By: Ileana Roup M.D. On: 04/04/2021 15:51    Labs:  CBC: Recent Labs    04/07/21 0114 04/08/21 0301 04/09/21 0407 04/10/21 0523  WBC 7.0 9.0 7.4 7.9  HGB 7.1* 9.5* 9.0* 9.9*  HCT 24.2* 29.2* 28.6* 31.3*  PLT 126* 138* 133* 176    COAGS: Recent Labs    09/16/20 1441 09/16/20 1855 09/26/20 1517  INR SPECIMEN CLOTTED 1.0 0.9  APTT  --  29 30    BMP: Recent Labs    04/07/21 0114 04/08/21 0301 04/09/21 0407 04/10/21 0523  NA 134* 134* 135 137  K 5.0 4.5 5.2* 4.7  CL 97* 96* 96* 95*  CO2 26 27 26  26  GLUCOSE 132* 135* 116* 96  BUN 64* 72* 83* 96*  CALCIUM 7.9* 8.0* 7.7* 8.2*  CREATININE 4.46* 5.13* 5.71* 6.40*  GFRNONAA 10* 8* 7* 6*    LIVER FUNCTION TESTS: Recent Labs    09/16/20 1441 09/26/20 1517 01/07/21 0419 04/03/21 1000  BILITOT 0.8 0.6 0.5 0.5  AST 19 17 15 18   ALT 12 9 14 11   ALKPHOS 99 97 105 79  PROT 6.4* 5.9* 6.2* 5.8*  ALBUMIN 3.1* 2.9* 2.8* 2.9*    TUMOR MARKERS: No results for input(s): AFPTM, CEA, CA199, CHROMGRNA in the last 8760 hours.  Assessment and Plan:  Progressive renal failure, likely secondary to hypotension from blood loss anemia.   Will proceed with image guided placement of a tunneled hemodialysis catheter today by Dr. Serafina Royals.  Risks and benefits discussed with the patient including, but not limited to bleeding,  infection, vascular injury, pneumothorax which may require chest tube placement, air embolism or even death  All of the patient's questions were answered, patient is agreeable to proceed. Consent signed and in chart.   Thank you for allowing our service to participate in ALLINE PIO 's care.  Electronically Signed: Murrell Redden, PA-C   04/10/2021, 1:19 PM      I spent a total of 20 Minutes  in face to face in clinical consultation, greater than 50% of which was counseling/coordinating care for tunneled HD cath.

## 2021-04-10 NOTE — Progress Notes (Signed)
Amy from [facility] is the person to contact for discharge placement, contact info [phone] ?

## 2021-04-10 NOTE — Progress Notes (Signed)
Physical Therapy Treatment ?Patient Details ?Name: [name] ?MRN: [id number] ?DOB: 1945-11-03 ?Today's Date: 04/10/2021 ? ? ?History of Present Illness Pt is a 76 y.o. female admitted 04/03/21 after fall at home when leg gave out sustaining L displaced intertrochanteric hip fx. S/p L hip cephalomedullary nail and closed management of superior pubic ramus fx 2/23. PMH includes afib, CAD, CHF, HTN, pacemaker, asthma, DM2, stroke, OSA, cervical sx, back sx, R ankle fx. ? ?  ?PT Comments  ? ? Pt received in supine and agreeable to session with max verbal cueing for pt to become more alert. Alertness improved throughout session intermittently, with most noted improvement when pt in upright sitting position. Pt needing up to mod-max assist for bed mobility with pt able to reach and pull with bedrails to roll R/L and reposition trunk once back supine, max assist to bring BLE's to/off EOB with HOB elevated to come to sitting EOB. Pt neednig min guard to max assist to maintain sitting balance at EOB secondary to pt intermittently becoming less alert/awake, pt able to maintain sitting balance with BUE support for LE exercise. Will continue to follow acutely for mobility progression. Pt continues to benefit from skilled PT services to progress toward functional mobility goals. Current plan remains appropriate.  ?  ?Recommendations for follow up therapy are one component of a multi-disciplinary discharge planning process, led by the attending physician.  Recommendations may be updated based on patient status, additional functional criteria and insurance authorization. ? ?Follow Up Recommendations ? Skilled nursing-short term rehab (<3 hours/day) ?  ?  ?Assistance Recommended at Discharge Frequent or constant Supervision/Assistance  ?Patient can return home with the following Two people to help with walking and/or transfers;Two people to help with bathing/dressing/bathroom;Assistance with cooking/housework;Assist for  transportation;Help with stairs or ramp for entrance ?  ?Equipment Recommendations ? Rolling walker (2 wheels);BSC/3in1;Wheelchair (measurements PT);Wheelchair cushion (measurements PT);Hospital bed  ?  ?Recommendations for Other Services   ? ? ?  ?Precautions / Restrictions Precautions ?Precautions: Fall;Other (comment) ?Precaution Comments: Wears 3L O2 baseline ?Restrictions ?Weight Bearing Restrictions: No ?LLE Weight Bearing: Weight bearing as tolerated  ?  ? ?Mobility ? Bed Mobility ?Overal bed mobility: Needs Assistance ?Bed Mobility: Supine to Sit, Rolling, Sit to Supine ?Rolling: Mod assist ?  ?Supine to sit: Max assist, HOB elevated ?Sit to supine: Max assist ?  ?General bed mobility comments: mod assist for rolling R/L with pt able to reach/pull with bedrails, max assist to come to sitting with HOB elevated for LE management and bringing trunk upright ?  ? ?Transfers ?  ?  ?  ?  ?  ?  ?  ?  ?  ?  ?  ? ?Ambulation/Gait ?  ?  ?  ?  ?  ?  ?  ?  ? ? ?Stairs ?  ?  ?  ?  ?  ? ? ?Wheelchair Mobility ?  ? ?Modified Rankin (Stroke Patients Only) ?  ? ? ?  ?Balance Overall balance assessment: Needs assistance ?Sitting-balance support: Feet supported, Bilateral upper extremity supported ?Sitting balance-Leahy Scale: Poor ?Sitting balance - Comments: Pt needed max to CGA with sitting at EOB. Pt need max cues to remain focused to sitting and to reamin alert/awake ?  ?  ?  ?  ?  ?  ?  ?  ?  ?  ?  ?  ?  ?  ?  ?  ? ?  ?Cognition Arousal/Alertness: Lethargic ?Behavior During Therapy: Flat affect ?Overall Cognitive Status:  Impaired/Different from baseline ?Area of Impairment: Following commands, Safety/judgement, Awareness, Problem solving ?  ?  ?  ?  ?  ?  ?  ?  ?  ?  ?  ?Following Commands: Follows one step commands inconsistently ?Safety/Judgement: Decreased awareness of safety, Decreased awareness of deficits ?  ?Problem Solving: Slow processing, Decreased initiation, Difficulty sequencing, Requires verbal cues,  Requires tactile cues ?General Comments: pt awake/alert intermittantly with max verbal cues throughout, pt closing eyes and having difficulty staying awake. ?  ?  ? ?  ?Exercises General Exercises - Lower Extremity ?Ankle Circles/Pumps: AROM, Both, 20 reps, Supine ?Long Arc Quad: AROM, Both, 10 reps, Seated ?Heel Slides: PROM, AAROM, Left, 10 reps, Supine ? ?  ?General Comments   ?  ?  ? ?Pertinent Vitals/Pain Pain Assessment ?Pain Assessment: Faces ?Faces Pain Scale: Hurts little more ?Pain Location: LLE ?Pain Descriptors / Indicators: Grimacing, Guarding, Moaning ?Pain Intervention(s): Limited activity within patient's tolerance, Monitored during session, Repositioned  ? ? ?Home Living   ?  ?  ?  ?  ?  ?  ?  ?  ?  ?   ?  ?Prior Function    ?  ?  ?   ? ?PT Goals (current goals can now be found in the care plan section) Acute Rehab PT Goals ?PT Goal Formulation: With patient ?Time For Goal Achievement: 04/19/21 ? ?  ?Frequency ? ? ? Min 3X/week ? ? ? ?  ?PT Plan Current plan remains appropriate  ? ? ?Co-evaluation   ?  ?  ?  ?  ? ?  ?AM-PAC PT "6 Clicks" Mobility   ?Outcome Measure ? Help needed turning from your back to your side while in a flat bed without using bedrails?: Total ?Help needed moving from lying on your back to sitting on the side of a flat bed without using bedrails?: Total ?Help needed moving to and from a bed to a chair (including a wheelchair)?: Total ?Help needed standing up from a chair using your arms (e.g., wheelchair or bedside chair)?: Total ?Help needed to walk in hospital room?: Total ?Help needed climbing 3-5 steps with a railing? : Total ?6 Click Score: 6 ? ?  ?End of Session Equipment Utilized During Treatment: Oxygen ?Activity Tolerance: Patient limited by lethargy ?Patient left: in bed;with call bell/phone within reach;with bed alarm set ?Nurse Communication: Mobility status ?PT Visit Diagnosis: Other abnormalities of gait and mobility (R26.89);Muscle weakness (generalized)  (M62.81);Pain ?Pain - Right/Left: Left ?Pain - part of body: Leg ?  ? ? ?Time: 4888-9169 ?PT Time Calculation (min) (ACUTE ONLY): 25 min ? ?Charges:  $Therapeutic Exercise: 8-22 mins ?$Therapeutic Activity: 8-22 mins          ? ?Audry Riles. PTA ?Acute Rehabilitation Services ?Office: [phone] ? ?          ?Betsey Holiday Amariss Detamore ?04/10/2021, 10:53 AM ? ?

## 2021-04-10 NOTE — TOC Progression Note (Signed)
Transition of Care (TOC) - Progression Note  ? ? ?Patient Details  ?Name: [name] ?MRN: [id number] ?Date of Birth: 08-20-1945 ? ?Transition of Care (TOC) CM/SW Contact  ?Joanne Chars, LCSW ?Phone Number: ?04/10/2021, 1:06 PM ? ?Clinical Narrative:   CSW spoke with son [name] and provided current bed offers.  He will look at these options.  He also asked about [facility] aide assistance that would allow him to bring pt home rather than SNF, discussed significant amount of assistance pt needing right now, son is hopeful pt will significantly improve once her kidney issues are resolved.  Pt does have medicaid, would potentially be candidate for PCS services.   ? ? ? ?Expected Discharge Plan: [place] ?Barriers to Discharge: Continued Medical Work up, SNF Pending bed offer ? ?Expected Discharge Plan and Services ?Expected Discharge Plan: [place] ?In-house Referral: Clinical Social Work ?  ?Post Acute Care Choice: [place] ?Living arrangements for the past 2 months: [place] ?                ?  ?  ?  ?  ?  ?  ?  ?  ?  ?  ? ? ?Social Determinants of Health (SDOH) Interventions ?  ? ?Readmission Risk Interventions ?Readmission Risk Prevention Plan 01/10/2021  ?Transportation Screening Complete  ?PCP or Specialist Appt within 3-5 Days Complete  ?[place] or Home Care Consult Complete  ?Social Work Consult for [place] Planning/Counseling Complete  ?Palliative Care Screening Not Applicable  ?Medication Review Press photographer) Complete  ?Some recent data might be hidden  ? ? ?

## 2021-04-11 ENCOUNTER — Inpatient Hospital Stay (HOSPITAL_COMMUNITY): Payer: Medicare Other

## 2021-04-11 DIAGNOSIS — S72002A Fracture of unspecified part of neck of left femur, initial encounter for closed fracture: Secondary | ICD-10-CM | POA: Diagnosis not present

## 2021-04-11 HISTORY — PX: IR US GUIDE VASC ACCESS LEFT: IMG2389

## 2021-04-11 HISTORY — PX: IR FLUORO GUIDE CV LINE LEFT: IMG2282

## 2021-04-11 LAB — CBC WITH DIFFERENTIAL/PLATELET
Abs Immature Granulocytes: 0.12 10*3/uL — ABNORMAL HIGH (ref 0.00–0.07)
Basophils Absolute: 0 10*3/uL (ref 0.0–0.1)
Basophils Relative: 0 %
Eosinophils Absolute: 0 10*3/uL (ref 0.0–0.5)
Eosinophils Relative: 0 %
HCT: 32.8 % — ABNORMAL LOW (ref 36.0–46.0)
Hemoglobin: 10.5 g/dL — ABNORMAL LOW (ref 12.0–15.0)
Immature Granulocytes: 1 %
Lymphocytes Relative: 3 %
Lymphs Abs: 0.3 10*3/uL — ABNORMAL LOW (ref 0.7–4.0)
MCH: 30.1 pg (ref 26.0–34.0)
MCHC: 32 g/dL (ref 30.0–36.0)
MCV: 94 fL (ref 80.0–100.0)
Monocytes Absolute: 0.9 10*3/uL (ref 0.1–1.0)
Monocytes Relative: 10 %
Neutro Abs: 7.7 10*3/uL (ref 1.7–7.7)
Neutrophils Relative %: 86 %
Platelets: 204 10*3/uL (ref 150–400)
RBC: 3.49 MIL/uL — ABNORMAL LOW (ref 3.87–5.11)
RDW: 15.4 % (ref 11.5–15.5)
WBC: 9 10*3/uL (ref 4.0–10.5)
nRBC: 0.8 % — ABNORMAL HIGH (ref 0.0–0.2)

## 2021-04-11 LAB — HEPATITIS B SURFACE ANTIBODY, QUANTITATIVE: Hep B S AB Quant (Post): >1000 m[IU]/mL

## 2021-04-11 LAB — BASIC METABOLIC PANEL
Anion gap: 20 — ABNORMAL HIGH (ref 5–15)
BUN: 111 mg/dL — ABNORMAL HIGH (ref 8–23)
CO2: 25 mmol/L (ref 22–32)
Calcium: 8 mg/dL — ABNORMAL LOW (ref 8.9–10.3)
Chloride: 92 mmol/L — ABNORMAL LOW (ref 98–111)
Creatinine, Ser: 7.05 mg/dL — ABNORMAL HIGH (ref 0.44–1.00)
GFR, Estimated: 6 mL/min — ABNORMAL LOW (ref >60)
Glucose, Bld: 110 mg/dL — ABNORMAL HIGH (ref 70–99)
Potassium: 5.5 mmol/L — ABNORMAL HIGH (ref 3.5–5.1)
Sodium: 137 mmol/L (ref 135–145)

## 2021-04-11 LAB — GLUCOSE, CAPILLARY
Glucose-Capillary: 102 mg/dL — ABNORMAL HIGH (ref 70–99)
Glucose-Capillary: 98 mg/dL (ref 70–99)
Glucose-Capillary: 103 mg/dL — ABNORMAL HIGH (ref 70–99)
Glucose-Capillary: 87 mg/dL (ref 70–99)

## 2021-04-11 LAB — HEPATITIS B SURFACE ANTIGEN

## 2021-04-11 MED ORDER — HEPARIN SODIUM (PORCINE) 1000 UNIT/ML IJ SOLN
INTRAMUSCULAR | Status: AC
  Filled 2021-04-11: qty 10

## 2021-04-11 MED ORDER — MIDAZOLAM HCL 2 MG/2ML IJ SOLN
INTRAMUSCULAR | Status: AC
  Filled 2021-04-11: qty 2

## 2021-04-11 MED ORDER — FENTANYL CITRATE (PF) 100 MCG/2ML IJ SOLN
INTRAMUSCULAR | Status: AC | PRN
  Administered 2021-04-11: 25 ug via INTRAVENOUS

## 2021-04-11 MED ORDER — VANCOMYCIN HCL IN DEXTROSE 1-5 GM/200ML-% IV SOLN
1000.0000 mg | Freq: Once | INTRAVENOUS | Status: AC
  Filled 2021-04-11: qty 200

## 2021-04-11 MED ORDER — LIDOCAINE HCL 1 % IJ SOLN
INTRAMUSCULAR | Status: AC
  Administered 2021-04-11: 10 mL via SUBCUTANEOUS
  Filled 2021-04-11: qty 20

## 2021-04-11 MED ORDER — MIDAZOLAM HCL 2 MG/2ML IJ SOLN
INTRAMUSCULAR | Status: AC | PRN
  Administered 2021-04-11: .5 mg via INTRAVENOUS

## 2021-04-11 MED ORDER — FENTANYL CITRATE (PF) 100 MCG/2ML IJ SOLN
INTRAMUSCULAR | Status: AC
  Filled 2021-04-11: qty 2

## 2021-04-11 MED ORDER — VANCOMYCIN HCL IN DEXTROSE 1-5 GM/200ML-% IV SOLN
INTRAVENOUS | Status: AC
  Administered 2021-04-11: 1000 mg via INTRAVENOUS
  Filled 2021-04-11: qty 200

## 2021-04-11 NOTE — Procedures (Signed)
Interventional Radiology Procedure Note ? ?Procedure: Tunneled HD catheter placement ? ?Complications: None ? ?Estimated Blood Loss: 20 mL ? ?Findings: ?Left IJ tunneled Palindrome catheter placed measuring 28 cm from tip to cuff. Tip in RA. OK to use. ? ?Eulas Post T. Kathlene Cote, M.D ?Pager:  [phone] ? ?  ?

## 2021-04-11 NOTE — Plan of Care (Signed)
?  Problem: Clinical Measurements: ?Goal: Ability to maintain clinical measurements within normal limits will improve ?Outcome: Not Progressing ?  ?

## 2021-04-11 NOTE — Progress Notes (Signed)
PROGRESS NOTE  [name]  DOB: 01/20/1946  PCP: Lujean Amel, MD ALP:[id number]  DOA: 04/03/2021  LOS: 8 days  Hospital Day: 9  Brief narrative: [name] is a 76 y.o. female with PMH significant for DM2, HTN, morbid obesity, OSA, OHS chronically on 2 L oxygen at home, CAD s/p CABG '16 (LIMA to LAD, SVG to D1, SVG to OM1 and OM2, SVG to PDA), HFpEF, complete heart block s/p PPM/ICD, CKD stage III, adrenal insufficiency. Patient presented to the ED on 04/03/2021 after a fall at home.  She lives at home with her son, ambulates inside the home without a walker but uses walker to walk.   On 2/22, patient was up and felt her left leg suddenly gave out and she fell on the floor landing on her left side and started immediately hurting in the left hip and leg.  Denies any premonition symptoms like chest pain, shortness of breath, palpitation, dizziness, nausea, vomiting.  Did not pass out or hit her head. EMS brought her to the ED.  In the ED, patient was hemodynamically stable. X-ray noted a displaced intertrochanteric left hip fracture with left superior pubic ramus fracture Admitted to hospitalist service Orthopedics consulted. On 2/23, patient underwent cephalomedullary nailing of left hip. Patient's hospital course got complicated by progressive AKI requiring dialysis.  Subjective: Patient was seen and examined this morning. Somnolent.  Making some urine output but less every day.  Creatinine worsening. Noted a plan for nephrology for dialysis catheter placement and first dialysis today.   Discussed with patient's son Mr. Fernando Torry this afternoon.  Full CODE STATUS.  Principal Problem:   Closed left hip fracture ([place]) secondary to fall at home Active Problems:   ANEMIA   HTN (hypertension), benign   Mixed hypercholesterolemia and hypertriglyceridemia   S/P CABG x 5   Chronic diastolic CHF (congestive heart failure) (HCC)   CHB (complete heart block) (HCC)    Diabetes mellitus without complication (HCC)   Chronic respiratory failure (HCC)   Thrombocytopenia (HCC)   Closed fracture of left superior pubic ramus (HCC)   Chronic kidney disease, stage III B (moderate) ([place])   Fall at home, initial encounter   Hypokalemia    Assessment and Plan: AKI on CKD 3B -Baseline creatinine less than 1.7.   -Postoperatively, patient's creatinine started to worsen.  She was started on IV fluid with no benefit.  Nephrology was consulted.  IV Lasix was tried after which patient started to make urine but creatinine continued to rise up.  She is getting more uremic now. -3/1 nephrology made a decision to initiate her on dialysis.  Dialysis catheter placed by IR today 3/2.  Plan for first dialysis today. Recent Labs    03/18/21 1127 04/03/21 1000 04/04/21 0226 04/05/21 0438 04/06/21 0457 04/07/21 0114 04/08/21 0301 04/09/21 0407 04/10/21 0523 04/11/21 0850  BUN 22 18 18  35* 52* 64* 72* 83* 96* 111*  CREATININE 1.55* 1.69* 1.75* 2.73* 3.80* 4.46* 5.13* 5.71* 6.40* 7.05*    Hyperkalemia -Potassium elevated due to progressive renal failure.  Continue to monitor Recent Labs  Lab 04/07/21 0114 04/08/21 0301 04/09/21 0407 04/10/21 0523 04/11/21 0850  K 5.0 4.5 5.2* 4.7 5.5*    Acute metabolic encephalopathy -Patient mostly sleepy.  Primarily due to uremia. -Once the uremia improves improves, I expect her mental status to improve. -Continue to monitor.  Closed left hip fracture secondary to fall at home Left superior pubic ramus fracture -Patient had a mechanical fall at  home impacting the left hip sustaining closed left hip fracture as well as the left superior pubic ramus fracture.   -2/23, patient underwent cephalomedullary nailing of left hip. -Pain control and DVT prophylaxis per orthopedics  Chronic diastolic CHF Hypertension -Last echocardiogram noted EF of 55 to 60% with grade 1 diastolic dysfunction in 10/8336.  Right ventricular size and  systolic function normal. -Home meds include beta-blocker, Imdur, torsemide. -Currently all her meds are on hold.   CAD S/P CABG x 5 HLD -Last cardiac catheterization was in 02/2019 where all territories were noted to have adequate coronary flow via bypasses or native vessels with stents. -Continue aspirin, Plavix, beta-blocker, isosorbide mononitrate and Crestor   CHB (complete heart block)    s/p PPM/ICD    Type 2 diabetes mellitus -A1c 6.5 in November 2022 -Home meds include Jardiance 10 mg daily -Currently on hold.  Continue sliding scale insulin. -Blood sugar level remain controlled Recent Labs  Lab 04/10/21 1129 04/10/21 1607 04/10/21 2050 04/11/21 0651 04/11/21 1152  GLUCAP 121* 104* 106* 102* 98    Acute blood loss anemia  Chronic mild macrocytic anemia -Baseline hemoglobin more than 10.   -Postoperatively, hemoglobin down trended to the lowest of 7 on 2/25.  2 units of PRBCs was given on 2/26.  Hemoglobin above 9.  No active bleeding from elsewhere.  Continue to monitor. Recent Labs    04/07/21 0114 04/08/21 0301 04/09/21 0407 04/10/21 0523 04/11/21 0850  HGB 7.1* 9.5* 9.0* 9.9* 10.5*  MCV 98.4 93.6 93.5 94.0 94.0    Morbid obesity  -Body mass index is 42.45 kg/m. Patient has been advised to make an attempt to improve diet and exercise patterns to aid in weight loss.  Chronic respiratory failure  OSA/OHS -Continue nasal cannula oxygen 3 L  Constipation -Due to pain meds, immobility. -Continue Senokot, MiraLAX daily, PRN Dulcolax -Last bowel movement last night per RN.  Mobility: Needs PT eval postprocedure Goals of care   Code Status: Full Code    Nutritional status:  Body mass index is 42.45 kg/m.      Diet:  Diet Order             Diet NPO time specified  Diet effective now                   DVT prophylaxis:     Antimicrobials: None Fluid: None Consultants: Orthopedics Family Communication: None at bedside.  Called and  updated patient's son Mr. Jahara Dail this afternoon.  Status is: Inpatient  Continue in-hospital care because: AKI postprocedure, plan for dialysis initiation this evening. Level of care: Telemetry Surgical   Dispo: The patient is from: Home              Anticipated d/c is to: PT eval obtained.  SNF recommended              Patient currently is not medically stable to d/c.   Difficult to place patient No     Infusions:   sodium chloride     sodium chloride       Scheduled Meds:  allopurinol  100 mg Oral Daily   aspirin EC  81 mg Oral Daily   Chlorhexidine Gluconate Cloth  6 each Topical Daily   clopidogrel  75 mg Oral Daily   cycloSPORINE  1 drop Both Eyes BID   heparin sodium (porcine)       insulin aspart  0-9 Units Subcutaneous TID WC   loratadine  10  mg Oral Daily   midodrine  10 mg Oral TID WC   mometasone-formoterol  2 puff Inhalation BID   pantoprazole  40 mg Oral BID   polyethylene glycol  17 g Oral Daily   polyvinyl alcohol  1 drop Both Eyes Q12H   rosuvastatin  5 mg Oral QPM   senna  1 tablet Oral BID    PRN meds: sodium chloride, sodium chloride, acetaminophen, alteplase, bisacodyl, guaiFENesin, heparin, HYDROcodone-acetaminophen, ondansetron (ZOFRAN) IV   Antimicrobials: Anti-infectives (From admission, onward)    Start     Dose/Rate Route Frequency Ordered Stop   04/11/21 1530  vancomycin (VANCOCIN) IVPB 1000 mg/200 mL premix        1,000 mg 200 mL/hr over 60 Minutes Intravenous  Once 04/11/21 1432 04/11/21 1537   04/04/21 1423  vancomycin (VANCOCIN) powder  Status:  Discontinued          As needed 04/04/21 1424 04/04/21 1551   04/04/21 1200  vancomycin (VANCOREADY) IVPB 1500 mg/300 mL        1,500 mg 150 mL/hr over 120 Minutes Intravenous On call to O.R. 04/03/21 1241 04/04/21 1529   04/04/21 1145  ceFAZolin (ANCEF) IVPB 2g/100 mL premix  Status:  Discontinued        2 g 200 mL/hr over 30 Minutes Intravenous On call to O.R. 04/04/21 0942  04/04/21 1717       Objective: Vitals:   04/11/21 1600 04/11/21 1628  BP: (!) 125/58   Pulse: 72 70  Resp: 19 19  Temp:    SpO2: 100% 98%    Intake/Output Summary (Last 24 hours) at 04/11/2021 1630 Last data filed at 04/11/2021 0700 Gross per 24 hour  Intake 0 ml  Output 400 ml  Net -400 ml     Filed Weights   04/03/21 1709 04/06/21 0343 04/07/21 0220  Weight: 105.6 kg 107.6 kg 108.7 kg   Weight change:  Body mass index is 42.45 kg/m.   Physical Exam: General exam: Pleasant, elderly morbidly obese Caucasian female.  Not in physical distress  Skin: No rashes, lesions or ulcers. HEENT: Atraumatic, normocephalic, no obvious bleeding Lungs: Diminished air entry in both bases because of sleepiness, poor respiratory effort. CVS: Regular rate and rhythm, no murmur GI/Abd soft, nontender, distended from obesity, bowel sound present CNS: Somnolent, opens eyes and verbal,.  Able to have short conversation. Psychiatry: Sad affect Extremities:  no calf tenderness  Data Review: I have personally reviewed the laboratory data and studies available.  F/u labs ordered Unresulted Labs (From admission, onward)     Start     Ordered   04/12/21 2297  Basic metabolic panel  Daily,   R     Question:  Specimen collection method  Answer:  Lab=Lab collect   04/11/21 0815   04/12/21 0500  CBC with Differential/Platelet  Daily,   R     Question:  Specimen collection method  Answer:  Lab=Lab collect   04/11/21 0815   04/10/21 1229  Hepatitis B surface antibody  (New Admission Hemo Labs (Hepatitis B))  Once,   R       Question:  Specimen collection method  Answer:  Lab=Lab collect   04/10/21 1228            Signed, Terrilee Croak, MD [facility] 04/11/2021

## 2021-04-11 NOTE — Progress Notes (Signed)
Pt returned to unit resting, response to voice, 10L NRB,  ?

## 2021-04-11 NOTE — Progress Notes (Signed)
? ?  Subjective: ? ?Rising creatinine this AM will plan to perform HD. Able to get to edge of bed with PT. ? ?Objective:  ? ?VITALS:   ?Vitals:  ? 04/10/21 2033 04/11/21 0019 04/11/21 0132 04/11/21 0434  ?BP:    (!) 112/52  ?Pulse:  70 69 73  ?Resp:  20 (!) 24 (!) 23  ?Temp:    98 ?F (36.7 ?C)  ?TempSrc:    Oral  ?SpO2: 94% 92% 92% 93%  ?Weight:      ?Height:      ?Dressing is clean dry intact.  Is able to dorsiflex plantarflex the left foot.  Fires EHL gastrocsoleus.  Sensation is intact throughout.  2+ dorsalis pedis pulse ? ?Lab Results  ?Component Value Date  ? WBC 7.9 04/10/2021  ? HGB 9.9 (L) 04/10/2021  ? HCT 31.3 (L) 04/10/2021  ? MCV 94.0 04/10/2021  ? PLT 176 04/10/2021  ? ? ? ?Assessment/Plan: ? ?Status post left hip cephalomedullary nail 2/23, acute blood loss anemia corrected s/p transfusion ? ?- Appreciate medical management for ongoing kidney injury ?- Patient to work with PT/OT to optimize mobilization safely  ?- DVT ppx - SCDs, ambulation, okay with home anticoagulation aspirin Plavix ?- WBAT operative extremity ? ? ?Vanetta Mulders ?04/11/2021, 7:32 AM ? ?

## 2021-04-11 NOTE — Progress Notes (Signed)
Occupational Therapy Treatment ?Patient Details ?Name: [name] ?MRN: [id number] ?DOB: May 30, 1945 ?Today's Date: 04/11/2021 ? ? ?History of present illness Pt is a 76 y.o. female admitted 04/03/21 after fall at home when leg gave out sustaining L displaced intertrochanteric hip fx. S/p L hip cephalomedullary nail and closed management of superior pubic ramus fx 2/23. PMH includes afib, CAD, CHF, HTN, pacemaker, asthma, DM2, stroke, OSA, cervical sx, back sx, R ankle fx. ?  ?OT comments ? Pt in today's session had increase in level of arousal as was able to report they are in the hospital and was able to speak with family in session via phone. Pt was able to increase in coordination to be able to use hairbrush with bringing to head and face washing. Pt was able to complete light AROM but need max verbal and tactile cues on use and increase in time to process.   ? ?Recommendations for follow up therapy are one component of a multi-disciplinary discharge planning process, led by the attending physician.  Recommendations may be updated based on patient status, additional functional criteria and insurance authorization. ?   ?Follow Up Recommendations ? Skilled nursing-short term rehab (<3 hours/day)  ?  ?Assistance Recommended at Discharge Frequent or constant Supervision/Assistance  ?Patient can return home with the following ? Two people to help with walking and/or transfers;Two people to help with bathing/dressing/bathroom;Assist for transportation;Help with stairs or ramp for entrance;Assistance with cooking/housework ?  ?Equipment Recommendations ?  (TBD)  ?  ?Recommendations for Other Services   ? ?  ?Precautions / Restrictions Precautions ?Precautions: Fall;Other (comment) ?Precaution Comments: Wears 3L O2 baseline ?Restrictions ?Weight Bearing Restrictions: No ?LLE Weight Bearing: Weight bearing as tolerated  ? ? ?  ? ?Mobility Bed Mobility ?  ?  ?  ?  ?  ?  ?  ?  ?  ? ?Transfers ?  ?  ?  ?  ?  ?  ?  ?  ?   ?General transfer comment: Pt has increase in level in ability to attend to activity in session but was concerned about HD in session ?  ?  ?Balance   ?  ?  ?  ?  ?  ?  ?  ?  ?  ?  ?  ?  ?  ?  ?  ?  ?  ?  ?   ? ?ADL either performed or assessed with clinical judgement  ? ?ADL Overall ADL's : Needs assistance/impaired ?Eating/Feeding: Moderate assistance;Cueing for safety;Cueing for sequencing;Bed level ?  ?Grooming: Moderate assistance;Cueing for safety;Cueing for sequencing;Bed level ?  ?Upper Body Bathing: Moderate assistance;Cueing for safety;Cueing for sequencing;Bed level ?  ?  ?  ?  ?  ?  ?  ?  ?  ?  ?  ?  ?  ?  ?  ?  ? ?Extremity/Trunk Assessment Upper Extremity Assessment ?Upper Extremity Assessment: RUE deficits/detail;LUE deficits/detail ?RUE Deficits / Details: Pt has WFL but decrease in coordination and increase in edema in BUE. Pt showed increase in abilty to coodinate BUE as was able to use phone with R hand with some stabilization from therapist. ?LUE Deficits / Details: Pt noted decrease in coordination of movements and edema ?  ?Lower Extremity Assessment ?Lower Extremity Assessment: Defer to PT evaluation ?  ?  ?  ? ?Vision   ?  ?  ?Perception   ?  ?Praxis   ?  ? ?Cognition Arousal/Alertness: Lethargic ?Behavior During Therapy: Flat affect ?Overall Cognitive Status: Impaired/Different  from baseline ?Area of Impairment: Following commands, Safety/judgement, Awareness, Problem solving ?  ?  ?  ?  ?  ?  ?  ?  ?  ?  ?  ?Following Commands: Follows one step commands inconsistently ?Safety/Judgement: Decreased awareness of safety, Decreased awareness of deficits ?  ?Problem Solving: Slow processing, Decreased initiation, Difficulty sequencing, Requires verbal cues, Requires tactile cues ?General Comments: Pt noted to have increase in awareness and orientation as pt's birthday today and was able to report. ?  ?  ?   ?Exercises Exercises: General Upper Extremity ?General Exercises - Upper  Extremity ?Shoulder Flexion: AROM, AAROM, Both, 10 reps ?Digit Composite Flexion: Both, AROM, 10 reps ? ?  ?Shoulder Instructions   ? ? ?  ?General Comments    ? ? ?Pertinent Vitals/ Pain       Pain Assessment ?Pain Assessment: Faces ?Faces Pain Scale: Hurts a little bit ?Breathing: normal ?Negative Vocalization: none ?Facial Expression: smiling or inexpressive ?Body Language: relaxed ?Consolability: no need to console ?PAINAD Score: 0 ?Pain Location: LLE ?Pain Descriptors / Indicators: Grimacing, Guarding, Moaning ?Pain Intervention(s): Limited activity within patient's tolerance ? ?Home Living   ?  ?  ?  ?  ?  ?  ?  ?  ?  ?  ?  ?  ?  ?  ?  ?  ?  ?  ? ?  ?Prior Functioning/Environment    ?  ?  ?  ?   ? ?Frequency ? Min 2X/week  ? ? ? ? ?  ?Progress Toward Goals ? ?OT Goals(current goals can now be found in the care plan section) ? Progress towards OT goals: Progressing toward goals ? ?Acute Rehab OT Goals ?Patient Stated Goal: to be able to feel better ?OT Goal Formulation: With patient ?Time For Goal Achievement: 04/20/21 ?Potential to Achieve Goals: Fair ?ADL Goals ?Pt Will Perform Upper Body Dressing: with min assist;sitting ?Pt Will Perform Lower Body Dressing: with adaptive equipment;with mod assist;sitting/lateral leans ?Pt Will Transfer to Toilet: with mod assist;with +2 assist;stand pivot transfer;bedside commode  ?Plan Discharge plan remains appropriate   ? ?Co-evaluation ? ? ?   ?  ?  ?  ?  ? ?  ?AM-PAC OT "6 Clicks" Daily Activity     ?Outcome Measure ? ? Help from another person eating meals?: A Little ?Help from another person taking care of personal grooming?: A Little ?Help from another person toileting, which includes using toliet, bedpan, or urinal?: Total ?Help from another person bathing (including washing, rinsing, drying)?: A Lot ?Help from another person to put on and taking off regular upper body clothing?: A Lot ?Help from another person to put on and taking off regular lower body clothing?:  Total ?6 Click Score: 12 ? ?  ?End of Session Equipment Utilized During Treatment: Oxygen ? ?OT Visit Diagnosis: Unsteadiness on feet (R26.81);Other abnormalities of gait and mobility (R26.89);Muscle weakness (generalized) (M62.81) ?  ?Activity Tolerance Patient limited by lethargy ?  ?Patient Left in bed;with call bell/phone within reach ?  ?Nurse Communication   ?  ? ?   ? ?Time: 5329-9242 ?OT Time Calculation (min): 29 min ? ?Charges: OT General Charges ?$OT Visit: 1 Visit ?OT Treatments ?$Self Care/Home Management : 23-37 mins ? ?Joeseph Amor OTR/L  ?Acute Rehab Services  ?[phone] office number ?[phone] pager number ? ? ?Joeseph Amor ?04/11/2021, 12:00 PM ?

## 2021-04-11 NOTE — Progress Notes (Signed)
Pt changed to 5L [place] tolerated well at 98% ?

## 2021-04-11 NOTE — Progress Notes (Signed)
[facility] ?Progress Note ? ?Subjective: ?No interval events ?For [facility] and HD#1 today ?Pt w/o questions ?0.8L UOP, SCr and BUN cont to increase, K 5.5 this AM ? ?Vitals:  ? 04/11/21 0132 04/11/21 0511 04/11/21 0802 04/11/21 0910  ?BP:  (!) 112/52    ?Pulse: 69 73    ?Resp: (!) 24 (!) 23    ?Temp:  98 ?F (36.7 ?C) 98.6 ?F (37 ?C)   ?TempSrc:  Oral    ?SpO2: 92% 93%  97%  ?Weight:      ?Height:      ? ? ?Exam: ?Gen obese, somnolent, deconditioned, no distress, awake ?rhonchorus ?RRR no RG ?Abd soft obese ntnd no mass or ascites +bs ?MS no joint effusions or deformity ?Ext trace LE/ UE edema ?Neuro Ox 3 , nf ?  ?  ? Home meds include - zyloprim, asa, bisoprolol 75 qd, suymbicort, plavix, colace, jardiance, imdur, protonix, klorcon 20 qd, crestor, demadex 60 - 80 bid, prns/ vits/ supps ?  ?    UA 2/25 cloudy, large Hb, 100 prot, 0-5 rbd, 6-10 wbc ?    Una 14,  UCr 63 ?   Renal US showed echogenic kidneys w/o hydro (10.8/ 9.8 cm) w/ multiple cysts bilat and bilat cortical thinning ?     Aug - dec 2022 = 1.3- 1.8, eGFR 28- 44, CKD 3b ?      CXR 2/22 mild vasc congestion  ?       alb 2.9 ?  ?  ?Assessment/ Plan: ?AKI on CKD 3b -  ?b/l creat 1.3- 1.8, eGFR 28- 44, CKD 3b from aug - dec 2022. Sees [place] at [facility].   ?Creat here 1.6 on admission 2/22 in setting of a fall w/ hip fracture and surgery.  ?Has had low BP's post op. Suspect ATN.  ?Renal US showed no obstruction and echogenic kidneys. UA unremarkable.  ?Started midodrine. Nonoliguric but SCr cont to inc.   ?Now severe and plan to start HD after [facility] placed today 3/2 ?HD#1: 2.5h, 250/500, 1L UF, 2K, 2.5Ca, post TDC ?L hip fracture - sp nailing L hip 2/23 per ortho ?Hyperkalemia, mild, will address with HD ?Anemia - d/t blood loss. Stable ?HTN - bp's soft to low-normal . Holding home BB, imdur and demadex. On TID Midodrine ?CAD sp CABG on DAPT ?SP PPM/ ICD ?DM2 ?Deconditioning ?AHRF on/off O2 ?  ?Rexene Agent, MD  ?04/11/2021, 10:51 AM ? ? ?Recent  Labs  ?Lab 04/10/21 ?0211 04/11/21 ?0850  ?K 4.7 5.5*  ?BUN 96* 111*  ?CREATININE 6.40* 7.05*  ?CALCIUM 8.2* 8.0*  ?HGB 9.9* 10.5*  ? ? ?Inpatient medications: ? allopurinol  100 mg Oral Daily  ? aspirin EC  81 mg Oral Daily  ? Chlorhexidine Gluconate Cloth  6 each Topical Daily  ? clopidogrel  75 mg Oral Daily  ? cycloSPORINE  1 drop Both Eyes BID  ? insulin aspart  0-9 Units Subcutaneous TID WC  ? loratadine  10 mg Oral Daily  ? midodrine  10 mg Oral TID WC  ? mometasone-formoterol  2 puff Inhalation BID  ? pantoprazole  40 mg Oral BID  ? polyethylene glycol  17 g Oral Daily  ? polyvinyl alcohol  1 drop Both Eyes Q12H  ? rosuvastatin  5 mg Oral QPM  ? senna  1 tablet Oral BID  ? ? sodium chloride    ? sodium chloride    ? ?sodium chloride, sodium chloride, acetaminophen, alteplase, bisacodyl, guaiFENesin, heparin, HYDROcodone-acetaminophen,  ondansetron (ZOFRAN) IV ? ? ? ? ? ? ?

## 2021-04-12 ENCOUNTER — Inpatient Hospital Stay (HOSPITAL_COMMUNITY): Payer: Medicare Other

## 2021-04-12 DIAGNOSIS — J9621 Acute and chronic respiratory failure with hypoxia: Secondary | ICD-10-CM

## 2021-04-12 DIAGNOSIS — J9622 Acute and chronic respiratory failure with hypercapnia: Secondary | ICD-10-CM

## 2021-04-12 DIAGNOSIS — S72002A Fracture of unspecified part of neck of left femur, initial encounter for closed fracture: Secondary | ICD-10-CM | POA: Diagnosis not present

## 2021-04-12 LAB — BASIC METABOLIC PANEL
Anion gap: 20 — ABNORMAL HIGH (ref 5–15)
BUN: 66 mg/dL — ABNORMAL HIGH (ref 8–23)
CO2: 21 mmol/L — ABNORMAL LOW (ref 22–32)
Calcium: 8.2 mg/dL — ABNORMAL LOW (ref 8.9–10.3)
Chloride: 95 mmol/L — ABNORMAL LOW (ref 98–111)
Creatinine, Ser: 5 mg/dL — ABNORMAL HIGH (ref 0.44–1.00)
GFR, Estimated: 8 mL/min — ABNORMAL LOW (ref >60)
Glucose, Bld: 81 mg/dL (ref 70–99)
Potassium: 5 mmol/L (ref 3.5–5.1)
Sodium: 136 mmol/L (ref 135–145)

## 2021-04-12 LAB — CBC WITH DIFFERENTIAL/PLATELET
Abs Immature Granulocytes: 0.19 10*3/uL — ABNORMAL HIGH (ref 0.00–0.07)
Basophils Absolute: 0 10*3/uL (ref 0.0–0.1)
Basophils Relative: 0 %
Eosinophils Absolute: 0 10*3/uL (ref 0.0–0.5)
Eosinophils Relative: 0 %
HCT: 28.6 % — ABNORMAL LOW (ref 36.0–46.0)
Hemoglobin: 9 g/dL — ABNORMAL LOW (ref 12.0–15.0)
Immature Granulocytes: 2 %
Lymphocytes Relative: 2 %
Lymphs Abs: 0.3 10*3/uL — ABNORMAL LOW (ref 0.7–4.0)
MCH: 29.4 pg (ref 26.0–34.0)
MCHC: 31.5 g/dL (ref 30.0–36.0)
MCV: 93.5 fL (ref 80.0–100.0)
Monocytes Absolute: 1 10*3/uL (ref 0.1–1.0)
Monocytes Relative: 8 %
Neutro Abs: 10.8 10*3/uL — ABNORMAL HIGH (ref 1.7–7.7)
Neutrophils Relative %: 88 %
Platelets: 207 10*3/uL (ref 150–400)
RBC: 3.06 MIL/uL — ABNORMAL LOW (ref 3.87–5.11)
RDW: 15.4 % (ref 11.5–15.5)
WBC: 12.3 10*3/uL — ABNORMAL HIGH (ref 4.0–10.5)
nRBC: 0.8 % — ABNORMAL HIGH (ref 0.0–0.2)

## 2021-04-12 LAB — POCT I-STAT 7, (LYTES, BLD GAS, ICA,H+H)
Acid-Base Excess: 0 mmol/L (ref 0.0–2.0)
Bicarbonate: 27.3 mmol/L (ref 20.0–28.0)
Calcium, Ion: 1.08 mmol/L — ABNORMAL LOW (ref 1.15–1.40)
HCT: 27 % — ABNORMAL LOW (ref 36.0–46.0)
Hemoglobin: 9.2 g/dL — ABNORMAL LOW (ref 12.0–15.0)
O2 Saturation: 95 %
Patient temperature: 98.5
Potassium: 5 mmol/L (ref 3.5–5.1)
Sodium: 133 mmol/L — ABNORMAL LOW (ref 135–145)
TCO2: 29 mmol/L (ref 22–32)
pCO2 arterial: 61.3 mmHg — ABNORMAL HIGH (ref 32–48)
pH, Arterial: 7.256 — ABNORMAL LOW (ref 7.35–7.45)
pO2, Arterial: 88 mmHg (ref 83–108)

## 2021-04-12 LAB — RENAL FUNCTION PANEL
Albumin: 1.9 g/dL — ABNORMAL LOW (ref 3.5–5.0)
Anion gap: 19 — ABNORMAL HIGH (ref 5–15)
BUN: 77 mg/dL — ABNORMAL HIGH (ref 8–23)
CO2: 20 mmol/L — ABNORMAL LOW (ref 22–32)
Calcium: 8.4 mg/dL — ABNORMAL LOW (ref 8.9–10.3)
Chloride: 93 mmol/L — ABNORMAL LOW (ref 98–111)
Creatinine, Ser: 5.48 mg/dL — ABNORMAL HIGH (ref 0.44–1.00)
GFR, Estimated: 8 mL/min — ABNORMAL LOW (ref >60)
Glucose, Bld: 116 mg/dL — ABNORMAL HIGH (ref 70–99)
Phosphorus: 8 mg/dL — ABNORMAL HIGH (ref 2.5–4.6)
Potassium: 5.2 mmol/L — ABNORMAL HIGH (ref 3.5–5.1)
Sodium: 132 mmol/L — ABNORMAL LOW (ref 135–145)

## 2021-04-12 LAB — GLUCOSE, CAPILLARY
Glucose-Capillary: 113 mg/dL — ABNORMAL HIGH (ref 70–99)
Glucose-Capillary: 113 mg/dL — ABNORMAL HIGH (ref 70–99)
Glucose-Capillary: 111 mg/dL — ABNORMAL HIGH (ref 70–99)
Glucose-Capillary: 99 mg/dL (ref 70–99)
Glucose-Capillary: 108 mg/dL — ABNORMAL HIGH (ref 70–99)

## 2021-04-12 LAB — BLOOD GAS, ARTERIAL
Acid-base deficit: 2.8 mmol/L — ABNORMAL HIGH (ref 0.0–2.0)
Bicarbonate: 26.2 mmol/L (ref 20.0–28.0)
O2 Saturation: 95.7 %
Patient temperature: 36.6
pCO2 arterial: 63 mmHg — ABNORMAL HIGH (ref 32–48)
pH, Arterial: 7.23 — ABNORMAL LOW (ref 7.35–7.45)
pO2, Arterial: 76 mmHg — ABNORMAL LOW (ref 83–108)

## 2021-04-12 MED ORDER — ORAL CARE MOUTH RINSE
15.0000 mL | Freq: Two times a day (BID) | OROMUCOSAL | Status: DC
  Administered 2021-04-13 – 2021-04-28 (×26): 15 mL via OROMUCOSAL

## 2021-04-12 MED ORDER — COSYNTROPIN 0.25 MG IJ SOLR
0.2500 mg | Freq: Once | INTRAMUSCULAR | Status: AC
  Administered 2021-04-13: 0.25 mg via INTRAVENOUS
  Filled 2021-04-12: qty 0.25

## 2021-04-12 MED ORDER — PRISMASOL BGK 4/2.5 32-4-2.5 MEQ/L EC SOLN
Status: DC
  Filled 2021-04-12 (×21): qty 5000

## 2021-04-12 MED ORDER — PRISMASOL BGK 4/2.5 32-4-2.5 MEQ/L REPLACEMENT SOLN
Status: DC
  Filled 2021-04-12 (×10): qty 5000

## 2021-04-12 MED ORDER — CHLORHEXIDINE GLUCONATE 0.12 % MT SOLN
15.0000 mL | Freq: Two times a day (BID) | OROMUCOSAL | Status: DC
  Administered 2021-04-12 – 2021-04-17 (×9): 15 mL via OROMUCOSAL
  Filled 2021-04-12 (×3): qty 15

## 2021-04-12 MED ORDER — SODIUM CHLORIDE 0.9 % IV SOLN
500.0000 [IU]/h | INTRAVENOUS | Status: DC
  Administered 2021-04-12 – 2021-04-14 (×3): 500 [IU]/h via INTRAVENOUS_CENTRAL
  Filled 2021-04-12 (×2): qty 10000
  Filled 2021-04-12: qty 2

## 2021-04-12 MED ORDER — FENTANYL CITRATE (PF) 100 MCG/2ML IJ SOLN
50.0000 ug | INTRAMUSCULAR | Status: DC | PRN
  Administered 2021-04-12 – 2021-04-14 (×5): 100 ug via INTRAVENOUS
  Administered 2021-04-15 – 2021-04-16 (×2): 50 ug via INTRAVENOUS
  Filled 2021-04-12 (×7): qty 2

## 2021-04-12 MED ORDER — HEPARIN SODIUM (PORCINE) 1000 UNIT/ML DIALYSIS
1000.0000 [IU] | INTRAMUSCULAR | Status: DC | PRN
  Administered 2021-04-14: 4200 [IU] via INTRAVENOUS_CENTRAL
  Filled 2021-04-12 (×2): qty 6

## 2021-04-12 MED ORDER — NOREPINEPHRINE 4 MG/250ML-% IV SOLN
2.0000 ug/min | INTRAVENOUS | Status: DC
  Administered 2021-04-12: 2 ug/min via INTRAVENOUS
  Filled 2021-04-12: qty 250

## 2021-04-12 MED ORDER — SODIUM CHLORIDE 0.9 % IV SOLN
250.0000 mL | INTRAVENOUS | Status: DC
Start: 2021-04-12 — End: 2021-04-28

## 2021-04-12 MED ORDER — PRISMASOL BGK 4/2.5 32-4-2.5 MEQ/L REPLACEMENT SOLN
Status: DC
  Filled 2021-04-12 (×9): qty 5000

## 2021-04-12 MED ORDER — HEPARIN (PORCINE) 2000 UNITS/L FOR CRRT: INTRAVENOUS_CENTRAL | Status: DC | PRN

## 2021-04-12 NOTE — Significant Event (Signed)
Rapid Response Event Note  ? ?Reason for Call :  ?Lethargy, hypotension ?BP 77/41 ? ?Initial Focused Assessment:  ?Pt lying in bed, minimal response to noxious stimuli. Oriented to person and place, wakefulness non-sustained. Skin is warm, dry, generalized pitting edema 3+. Lung sounds are diminished in the bases. Breathing is regular, unlabored. Scant, tea colored UOP.  ? ?VS: BP 77/41, HR 102, RR 13, SpO2 95% on BiPAP 50% 18/6 ?CBG: 113 ? ?Plan of Care:  ?-Transfer to ICU ? ?Event Summary:  ?MD Notified: Dr. Pietro Cassis ?Call Time: 1150 ?Arrival Time: 1150 ?End Time: 1430 ? ?Casimer Bilis, RN ?

## 2021-04-12 NOTE — Plan of Care (Signed)
  Problem: Health Behavior/Discharge Planning: Goal: Ability to manage health-related needs will improve Outcome: Progressing   Problem: Clinical Measurements: Goal: Ability to maintain clinical measurements within normal limits will improve Outcome: Progressing   Problem: Clinical Measurements: Goal: Will remain free from infection Outcome: Progressing   

## 2021-04-12 NOTE — Progress Notes (Signed)
?   04/12/21 1153  ?Assess: MEWS Score  ?Temp 98.5 ?F (36.9 ?C)  ?BP (!) 106/45  ?Pulse Rate 77  ?ECG Heart Rate 74  ?Resp 12  ?Level of Consciousness Responds to Pain  ?SpO2 96 %  ?O2 Device Bi-PAP  ?O2 Flow Rate (L/min) 50 L/min  ?Assess: MEWS Score  ?MEWS Temp 0  ?MEWS Systolic 0  ?MEWS Pulse 0  ?MEWS RR 1  ?MEWS LOC 2  ?MEWS Score 3  ?MEWS Score Color Yellow  ?Assess: if the MEWS score is Yellow or Red  ?Were vital signs taken at a resting state? Yes  ?Focused Assessment No change from prior assessment  ?Early Detection of Sepsis Score *See Row Information* Low  ?MEWS guidelines implemented *See Row Information* No, previously yellow, continue vital signs every 4 hours  ?Take Vital Signs  ?Increase Vital Sign Frequency  Yellow: Q 2hr X 2 then Q 4hr X 2, if remains yellow, continue Q 4hrs  ?Escalate  ?MEWS: Escalate Yellow: discuss with charge nurse/RN and consider discussing with provider and RRT  ?Notify: Charge Nurse/RN  ?Name of Charge Nurse/RN Notified AJ  ?Date Charge Nurse/RN Notified 04/12/21  ?Time Charge Nurse/RN Notified 1145  ?Notify: Provider  ?Provider Name/Title Marlowe Aschoff  ?Date Provider Notified 04/12/21  ?Time Provider Notified 1213  ?Notification Type Page  ?Notification Reason Change in status  ?Provider response See new orders  ?Date of Provider Response 04/12/21  ?Time of Provider Response 1213  ?Notify: Rapid Response  ?Name of Rapid Response RN Notified Kennis Carina  ?Date Rapid Response Notified 04/12/21  ?Time Rapid Response Notified 1140  ?Document  ?Patient Outcome Transferred/level of care increased  ?Progress note created (see row info) Yes  ? ? ?

## 2021-04-12 NOTE — Progress Notes (Signed)
[name] ?Progress Note ? ?Subjective: ?S/p TDC and HD#1 yesterday, 1L UF ?MOre somnolent after pain meds this AM, on BiPAP ?[name], updated again today ? ?Vitals:  ? 04/11/21 2150 04/12/21 0413 04/12/21 2993 04/12/21 0905  ?BP: 139/89 (!) 107/47 (!) 113/53   ?Pulse:  94  98  ?Resp: 20 18  (!) 24  ?Temp:      ?TempSrc:      ?SpO2:  97%  97%  ?Weight:      ?Height:      ? ? ?Exam: ?Gen obese, somnolent, deconditioned, somnolent on BiPAP ?RRR no RG ?Abd soft obese ntnd no mass or ascites +bs ?MS no joint effusions or deformity ?Ext trace LE/ UE edema ?IJ TDC noted ?  ?  ? Home meds include - zyloprim, asa, bisoprolol 75 qd, suymbicort, plavix, colace, jardiance, imdur, protonix, klorcon 20 qd, crestor, demadex 60 - 80 bid, prns/ vits/ supps ?  ?    UA 2/25 cloudy, large Hb, 100 prot, 0-5 rbd, 6-10 wbc ?    Una 14,  UCr 63 ?   Renal US showed echogenic kidneys w/o hydro (10.8/ 9.8 cm) w/ multiple cysts bilat and bilat cortical thinning ?     Aug - dec 2022 = 1.3- 1.8, eGFR 28- 44, CKD 3b ?      CXR 2/22 mild vasc congestion  ?       alb 2.9 ?  ?  ?Assessment/ Plan: ?AKI on CKD 3b -  ?b/l creat 1.3- 1.8, eGFR 28- 44, CKD 3b from aug - dec 2022. Sees [place] at [facility].   ?Creat here 1.6 on admission 2/22 in setting of a fall w/ hip fracture and surgery.  ?Has had low BP's post op. Suspect ATN.  ?Renal US showed no obstruction and echogenic kidneys. UA unremarkable.  ?Started midodrine. Nonoliguric but SCr cont to inc.   ?IR placed [facility] 04/11/21 ?Started HD 04/11/21  ?HD#2 today for volume: 3h, 250/300, 2-3L UF, 2K, 2.5Ca, TDC, no hep ?L hip fracture - sp nailing L hip 2/23 per ortho ?Hyperkalemia, resolved ?Anemia - stable CTM ?HTN - bp's soft to low-normal . Holding home BB, imdur and demadex. On TID Midodrine ?CAD sp CABG on DAPT ?SP PPM/ ICD ?DM2 ?Deconditioning ?AHRF on/off O2, currently on BiPAP ?  ?Rexene Agent, MD  ?04/12/2021, 10:50 AM ? ? ?Recent Labs  ?Lab 04/11/21 ?0850  04/12/21 ?0233  ?K 5.5* 5.0  ?BUN 111* 66*  ?CREATININE 7.05* 5.00*  ?CALCIUM 8.0* 8.2*  ?HGB 10.5* 9.0*  ? ? ?Inpatient medications: ? allopurinol  100 mg Oral Daily  ? aspirin EC  81 mg Oral Daily  ? Chlorhexidine Gluconate Cloth  6 each Topical Daily  ? clopidogrel  75 mg Oral Daily  ? cycloSPORINE  1 drop Both Eyes BID  ? insulin aspart  0-9 Units Subcutaneous TID WC  ? loratadine  10 mg Oral Daily  ? midodrine  10 mg Oral TID WC  ? mometasone-formoterol  2 puff Inhalation BID  ? pantoprazole  40 mg Oral BID  ? polyethylene glycol  17 g Oral Daily  ? polyvinyl alcohol  1 drop Both Eyes Q12H  ? rosuvastatin  5 mg Oral QPM  ? senna  1 tablet Oral BID  ? ? ? ?acetaminophen, bisacodyl, guaiFENesin, HYDROcodone-acetaminophen, ondansetron (ZOFRAN) IV ? ? ? ? ? ? ?

## 2021-04-12 NOTE — Progress Notes (Signed)
Patient transferred to medical ICU with persistent BiPAP dependence, encephalopathy.  Likely to progress to intubation.  Will initiate CRRT, goal UF 50 to 100 mL/h net negative, all 4K fluids, fixed dose heparin. ?

## 2021-04-12 NOTE — Progress Notes (Signed)
?   04/12/21 0800  ?Assess: MEWS Score  ?BP (!) 96/42  ?Pulse Rate 84  ?ECG Heart Rate 84  ?Resp 19  ?Level of Consciousness Responds to Pain  ?SpO2 95 %  ?O2 Device Bi-PAP  ?Patient Activity (if Appropriate) In bed  ?Assess: MEWS Score  ?MEWS Temp 0  ?MEWS Systolic 1  ?MEWS Pulse 0  ?MEWS RR 0  ?MEWS LOC 2  ?MEWS Score 3  ?MEWS Score Color Yellow  ?Assess: if the MEWS score is Yellow or Red  ?Were vital signs taken at a resting state? Yes  ?Focused Assessment No change from prior assessment  ?Early Detection of Sepsis Score *See Row Information* Low  ?MEWS guidelines implemented *See Row Information* Yes  ?Treat  ?MEWS Interventions Escalated (See documentation below)  ?Pain Scale Faces  ?Faces Pain Scale 0  ?Take Vital Signs  ?Increase Vital Sign Frequency  Yellow: Q 2hr X 2 then Q 4hr X 2, if remains yellow, continue Q 4hrs  ?Escalate  ?MEWS: Escalate Yellow: discuss with charge nurse/RN and consider discussing with provider and RRT  ?Notify: Charge Nurse/RN  ?Name of Charge Nurse/RN Notified AJ  ?Date Charge Nurse/RN Notified 04/12/21  ?Time Charge Nurse/RN Notified 0830  ?Notify: Provider  ?Provider Name/Title Marlowe Aschoff  ?Date Provider Notified 04/12/21  ?Time Provider Notified [phone]  ?Notification Type Face-to-face  ?Notification Reason Change in status  ?Provider response See new orders  ?Date of Provider Response 04/12/21  ?Time of Provider Response [phone]  ?Document  ?Patient Outcome Transferred/level of care increased  ?Progress note created (see row info) Yes  ? ? ?

## 2021-04-12 NOTE — Progress Notes (Signed)
PT Cancellation Note ? ?Patient Details ?Name: [name] ?MRN: [id number] ?DOB: 27-Nov-1945 ? ? ?Cancelled Treatment:    Reason Eval/Treat Not Completed: Medical issues which prohibited therapy, per RN pt on BIPAP and not appropriate, will check back/re-attempt as schedule permits to cont with POC.  ? ? ?Betsey Holiday Fidelia Cathers ?04/12/2021, 10:17 AM ?

## 2021-04-12 NOTE — Consult Note (Signed)
NAME:  [name], MRN:  [id number], DOB:  17-Oct-1945, LOS: 9 ADMISSION DATE:  04/03/2021, CONSULTATION DATE: 04/12/2021 REFERRING MD: Hospitalist, CHIEF COMPLAINT: Worsening respiratory distress  History of Present Illness:  76 year old obese female with known OSA OHS O2 dependent at 3 L nasal cannula who fell and suffered a fracture left femur.  She developed worsening renal failure while in the hospital required a HD catheter that was tunneled and placed on 04/11/2021 per interventional radiology.  On 04/12/2021 she had worsening respiratory failure she was obtunded and was transferred to intensive care unit for further evaluation and treatment by pulmonary critical care.  She will most likely need intubation full mechanical ventilatory support and CRRT.  She is noted to be hypotensive is on midodrine may require vasopressor support  Pertinent  Medical History   Past Medical History:  Diagnosis Date   ABDOMINAL PAIN -GENERALIZED 02/21/2010   Qualifier: Diagnosis of  By: Chester Holstein NP, Nevin Bloodgood     AICD (automatic cardioverter/defibrillator) present    downgraded to CRT-P in 2014   Anemia    Arthritis    Asthma    Atrial fibrillation ([place]) 10/24/2015   Questionable history of A Fib/A Flutter. On device interrogation on 9/7, showed 0.37min of A Fib/A Flutter with 1% burden.  Device check in Jan 2018 showed no sustained AF (runs of less than 30 seconds). No anticoagulation indicated.   CAD (coronary artery disease)    a. 10/09/16 LHC: SVG->LAD patent, SVG->Diag patent, SVG->RCA patent, SVG->LCx occluded. EF 60%, b. 10/31/16 LHC DES to AV groove Circ, DES to intermed branch   Cellulitis and abscess of foot 12/2014   RT FOOT   CHF (congestive heart failure) (HCC)    Chronic bronchitis (HCC)    Chronic lower back pain    Complete heart block (HCC) 06/22/2015   Diverticulosis    Facial numbness 02/2016   Fatty liver disease, nonalcoholic    Gastritis    GERD (gastroesophageal reflux disease)     Gout    H/O hiatal hernia    HEMORRHOIDS-EXTERNAL 02/21/2010   Qualifier: Diagnosis of  By: Chester Holstein NP, Paula     High cholesterol    Hyperplastic colon polyp    Hypertension    IBS (irritable bowel syndrome)    Internal hemorrhoids    INTERNAL HEMORRHOIDS WITHOUT MENTION COMP 04/12/2007   Qualifier: Diagnosis of  By: Olevia Perches MD, Dora M    Nonischemic cardiomyopathy ([place]) 11/22/2011   Pt responded to BiV ICD- last EF 55-60% Sept 2017   Obesity    On home oxygen therapy    "2L at night" (10/31/2016)   OSA (obstructive sleep apnea)    "can't tolerate a mask" (10/30/2016)   PERSONAL HX COLONIC POLYPS 02/21/2010   Qualifier: Diagnosis of  By: Chester Holstein NP, Paula     Pneumonia    "couple times" (10/31/2016)   Presence of permanent cardiac pacemaker    11/02/12 [employer] V273 INTUA PPM   Right facial numbness 03/05/2016   Shortness of breath    Stroke ([place]) 09/2020   TIA (transient ischemic attack)    "recently" (10/31/2016)   Type II diabetes mellitus ([place])      Significant Hospital Events: Including procedures, antibiotic start and stop dates in addition to other pertinent events   04/12/2021 transferred intensive care unit  Interim History / Subjective:  76 year old female with worsening renal failure required noninvasive mechanical ventilatory support will be transferred to the ICU.  Objective   Blood pressure [name])  88/48, pulse 87, temperature 98.5 F (36.9 C), temperature source Axillary, resp. rate 19, height 5\' 3"  (1.6 m), weight 105.5 kg, SpO2 96 %.    FiO2 (%):  [100 %] 100 %   Intake/Output Summary (Last 24 hours) at 04/12/2021 1235 Last data filed at 04/12/2021 0900 Gross per 24 hour  Intake 0 ml  Output 1000 ml  Net -1000 ml   Filed Weights   04/07/21 0220 04/11/21 1705 04/11/21 1959  Weight: 108.7 kg 106.5 kg 105.5 kg    Examination: General: Morbid obese female is currently on noninvasive mechanical ventilatory support HENT: Short neck fullface mask is  in place Lungs: Decreased breath sounds throughout Cardiovascular: Heart sounds are distant Abdomen: Soft positive bowel sounds tender palpation Extremities: Left leg nail grafting site unremarkable 1-2+ lower extremity edema Neuro: Obtunded at this time  Resolved Hospital Problem list     Assessment & Plan:  Acute respiratory failure in the setting of obstructive sleep apnea worsening renal failure with resulting obtundation and will most likely require intubation and transfer to the intensive care unit.  Chronically O2 dependent Noninvasive mechanical ventilatory support Transferred to the intensive care unit Intubate Full mechanical ventilatory support CRRT to remove toxins   Hypotension on midodrine Increase midodrine May need vasopressor support   Acute renal failure Lab Results  Component Value Date   CREATININE 5.00 (H) 04/12/2021   CREATININE 7.05 (H) 04/11/2021   CREATININE 6.40 (H) 04/10/2021   CREATININE 1.05 04/04/2014   CREATININE 1.31 (H) 12/12/2013   CREATININE 1.37 (H) 11/16/2013   Nephrology is following Left subclavian tunneled cath has been placed by interventional radiology CRRT per nephrology  Post ORIF repair per orthopedics Per orthopedic  History of coronary disease status post coronary bypass grafting 2016 AICD is in place Monitor in intensive care unit     Best Practice (right click and "Reselect all SmartList Selections" daily)   Diet/type: NPO DVT prophylaxis: other GI prophylaxis: PPI Lines: Dialysis Catheter Foley:  Yes, and it is still needed Code Status:  full code Last date of multidisciplinary goals of care discussion [tbd]  Labs   CBC: Recent Labs  Lab 04/08/21 0301 04/09/21 0407 04/10/21 0523 04/11/21 0850 04/12/21 0233  WBC 9.0 7.4 7.9 9.0 12.3*  NEUTROABS 8.0* 6.1 6.6 7.7 10.8*  HGB 9.5* 9.0* 9.9* 10.5* 9.0*  HCT 29.2* 28.6* 31.3* 32.8* 28.6*  MCV 93.6 93.5 94.0 94.0 93.5  PLT 138* 133* 176 204 207     Basic Metabolic Panel: Recent Labs  Lab 04/08/21 0301 04/09/21 0407 04/10/21 0523 04/11/21 0850 04/12/21 0233  NA 134* 135 137 137 136  K 4.5 5.2* 4.7 5.5* 5.0  CL 96* 96* 95* 92* 95*  CO2 27 26 26 25  21*  GLUCOSE 135* 116* 96 110* 81  BUN 72* 83* 96* 111* 66*  CREATININE 5.13* 5.71* 6.40* 7.05* 5.00*  CALCIUM 8.0* 7.7* 8.2* 8.0* 8.2*   GFR: Estimated Creatinine Clearance: 10.9 mL/min (A) (by C-G formula based on SCr of 5 mg/dL (H)). Recent Labs  Lab 04/09/21 0407 04/10/21 0523 04/11/21 0850 04/12/21 0233  WBC 7.4 7.9 9.0 12.3*    Liver Function Tests: No results for input(s): AST, ALT, ALKPHOS, BILITOT, PROT, ALBUMIN in the last 168 hours. No results for input(s): LIPASE, AMYLASE in the last 168 hours. No results for input(s): AMMONIA in the last 168 hours.  ABG    Component Value Date/Time   PHART 7.23 (L) 04/12/2021 1058   PCO2ART 63 (H) 04/12/2021 1058  PO2ART 76 (L) 04/12/2021 1058   HCO3 26.2 04/12/2021 1058   TCO2 40 (H) 09/16/2020 1447   ACIDBASEDEF 2.8 (H) 04/12/2021 1058   O2SAT 95.7 04/12/2021 1058     Coagulation Profile: No results for input(s): INR, PROTIME in the last 168 hours.  Cardiac Enzymes: No results for input(s): CKTOTAL, CKMB, CKMBINDEX, TROPONINI in the last 168 hours.  HbA1C: Hgb A1c MFr Bld  Date/Time Value Ref Range Status  04/04/2021 02:26 AM 6.3 (H) 4.8 - 5.6 % Final    Comment:    (NOTE)         Prediabetes: 5.7 - 6.4         Diabetes: >6.4         Glycemic control for adults with diabetes: <7.0   01/08/2021 06:47 AM 6.5 (H) 4.8 - 5.6 % Final    Comment:    (NOTE)         Prediabetes: 5.7 - 6.4         Diabetes: >6.4         Glycemic control for adults with diabetes: <7.0     CBG: Recent Labs  Lab 04/11/21 1152 04/11/21 1640 04/11/21 2044 04/12/21 0727 04/12/21 1141  GLUCAP 98 103* 87 113* 113*    Review of Systems:   na  Past Medical History:  She,  has a past medical history of ABDOMINAL PAIN  -GENERALIZED (02/21/2010), AICD (automatic cardioverter/defibrillator) present, Anemia, Arthritis, Asthma, Atrial fibrillation ([place]) (10/24/2015), CAD (coronary artery disease), Cellulitis and abscess of foot (12/2014), CHF (congestive heart failure) ([place]), Chronic bronchitis ([place]), Chronic lower back pain, Complete heart block ([place]) (06/22/2015), Diverticulosis, Facial numbness (02/2016), Fatty liver disease, nonalcoholic, Gastritis, GERD (gastroesophageal reflux disease), Gout, H/O hiatal hernia, HEMORRHOIDS-EXTERNAL (02/21/2010), High cholesterol, Hyperplastic colon polyp, Hypertension, IBS (irritable bowel syndrome), Internal hemorrhoids, INTERNAL HEMORRHOIDS WITHOUT MENTION COMP (04/12/2007), Nonischemic cardiomyopathy ([place]) (11/22/2011), Obesity, On home oxygen therapy, OSA (obstructive sleep apnea), PERSONAL HX COLONIC POLYPS (02/21/2010), Pneumonia, Presence of permanent cardiac pacemaker, Right facial numbness (03/05/2016), Shortness of breath, Stroke ([place]) (09/2020), TIA (transient ischemic attack), and Type II diabetes mellitus ([place]).   Surgical History:   Past Surgical History:  Procedure Laterality Date   ABDOMINAL ULTRASOUND  12/01/2011   Peripelvic cysts- #1- 2.4x1.9x2.3cm, #2-1.2x0.9x1.2cm   ANKLE FRACTURE SURGERY Right    "had rod put in"   ANTERIOR CERVICAL DECOMP/DISCECTOMY FUSION     APPENDECTOMY     BACK SURGERY     BIOPSY  12/29/2017   Procedure: BIOPSY;  Surgeon: [name], MD;  [facility]: [facility];  Service: Endoscopy;;   BIV ICD INSERTION CRT-D  2001?   BIV PACEMAKER GENERATOR CHANGE OUT N/A 11/02/2012   Procedure: BIV PACEMAKER GENERATOR CHANGE OUT;  Surgeon: Sanda Klein, MD;  Location: [facility] CATH [facility];  Service: Cardiovascular;  Laterality: N/A;   CARDIAC CATHETERIZATION  05/17/1999   No significant coronary obstructive disease w/ mild 20% luminal irregularity of the first diag branch of the LAD   CARDIAC CATHETERIZATION  07/08/2002   No significant CAD,  moderately depressed LV systolic function   CARDIAC CATHETERIZATION Bilateral 04/26/2007   Normal findings, recommend medical therapy   CARDIAC CATHETERIZATION  02/18/2008   Moderate CAD, would benefit from having a functional study, recommend continue medical therapy   CARDIAC CATHETERIZATION  07/23/2012   Medical therapy   CARDIAC CATHETERIZATION N/A 11/24/2014   Procedure: Left Heart Cath and Coronary Angiography;  Surgeon: Troy Sine, MD;  Location: [facility] CV LAB;  Service: Cardiovascular;  Laterality: N/A;   CARDIAC CATHETERIZATION  11/27/2014   Procedure: Intravascular Pressure Wire/FFR Study;  Surgeon: Peter M Martinique, MD;  Location: [facility];  Service: Cardiovascular;;   CARDIAC CATHETERIZATION  10/09/2016   CHOLECYSTECTOMY N/A 04/09/2018   Procedure: LAPAROSCOPIC CHOLECYSTECTOMY;  Surgeon: Greer Pickerel, MD;  [facility];  Service: General;  Laterality: N/A;   COLONOSCOPY WITH PROPOFOL N/A 12/29/2017   Procedure: COLONOSCOPY WITH PROPOFOL;  Surgeon: [name], MD;  [facility];  Service: Endoscopy;  Laterality: N/A;   CORONARY ANGIOGRAM  2010   CORONARY ARTERY BYPASS GRAFT N/A 11/29/2014   Procedure: CORONARY ARTERY BYPASS GRAFTING x 5 (LIMA-LAD, SVG-D, SVG-OM1-OM2, SVG-PD);  Surgeon: Melrose Nakayama, MD;  Location: [facility];  Service: Open Heart Surgery;  Laterality: N/A;   CORONARY STENT INTERVENTION N/A 10/31/2016   Procedure: CORONARY STENT INTERVENTION;  Surgeon: Burnell Blanks, MD;  Location: [facility] LAB;  Service: Cardiovascular;  Laterality: N/A;   ESOPHAGOGASTRODUODENOSCOPY (EGD) WITH PROPOFOL N/A 12/29/2017   Procedure: ESOPHAGOGASTRODUODENOSCOPY (EGD) WITH PROPOFOL;  Surgeon: [name], MD;  [facility];  Service: Endoscopy;  Laterality: N/A;   FRACTURE SURGERY     INSERT / REPLACE / REMOVE PACEMAKER  1999   INTRAMEDULLARY (IM) NAIL INTERTROCHANTERIC Left 04/04/2021   Procedure: INTRAMEDULLARY  (IM) NAIL INTERTROCHANTRIC;  Surgeon: Vanetta Mulders, MD;  Location: [facility];  Service: Orthopedics;  Laterality: Left;   IR FLUORO GUIDE CV LINE LEFT  04/11/2021   IR US GUIDE VASC ACCESS LEFT  04/11/2021   KNEE ARTHROSCOPY Bilateral    LEFT HEART CATH AND CORS/GRAFTS ANGIOGRAPHY N/A 12/09/2017   Procedure: LEFT HEART CATH AND CORS/GRAFTS ANGIOGRAPHY;  Surgeon: Troy Sine, MD;  Location: [facility] LAB;  Service: Cardiovascular;  Laterality: N/A;   LEFT HEART CATHETERIZATION WITH CORONARY ANGIOGRAM N/A 07/23/2012   Procedure: LEFT HEART CATHETERIZATION WITH CORONARY ANGIOGRAM;  Surgeon: Leonie Man, MD;  Location: [facility] CATH LAB;  Service: Cardiovascular;  Laterality: N/A;   LEXISCAN MYOVIEW  11/14/2011   Mild-moderate defect seen in Mid Inferolateral and Mid Anterolateral regions-consistant w/ infarct/scar. No significant ischemia demonstrated.   POLYPECTOMY  12/29/2017   Procedure: POLYPECTOMY;  Surgeon: [name], MD;  Location: [facility];  Service: Endoscopy;;   PPM GENERATOR CHANGEOUT N/A 12/31/2020   Procedure: PPM GENERATOR CHANGEOUT;  Surgeon: Sanda Klein, MD;  Location: [facility] LAB;  Service: Cardiovascular;  Laterality: N/A;   RIGHT/LEFT HEART CATH AND CORONARY ANGIOGRAPHY N/A 10/09/2016   Procedure: RIGHT/LEFT HEART CATH AND CORONARY ANGIOGRAPHY;  Surgeon: Jolaine Artist, MD;  Location: [facility] LAB;  Service: Cardiovascular;  Laterality: N/A;   RIGHT/LEFT HEART CATH AND CORONARY/GRAFT ANGIOGRAPHY N/A 03/11/2019   Procedure: RIGHT/LEFT HEART CATH AND CORONARY/GRAFT ANGIOGRAPHY;  Surgeon: Jolaine Artist, MD;  Location: [facility] LAB;  Service: Cardiovascular;  Laterality: N/A;   TEE WITHOUT CARDIOVERSION N/A 11/29/2014   Procedure: TRANSESOPHAGEAL ECHOCARDIOGRAM (TEE);  Surgeon: Melrose Nakayama, MD;  Location: [facility];  Service: Open Heart Surgery;  Laterality: N/A;   TRANSCAROTID ARTERY REVASCULARIZATION  Right 10/01/2020   Procedure:  RIGHT TRANSCAROTID ARTERY REVASCULARIZATION;  Surgeon: [name], MD;  Location: [facility];  Service: Vascular;  Laterality: Right;   TRANSTHORACIC ECHOCARDIOGRAM  07/23/2012   EF 55-60%, normal-mild   TUBAL LIGATION     ULTRASOUND GUIDANCE FOR VASCULAR ACCESS Left 10/01/2020   Procedure: ULTRASOUND GUIDANCE FOR VASCULAR ACCESS;  Surgeon: [name], MD;  Location: [facility];  Service: Vascular;  Laterality: Left;     Social History:   reports that she quit smoking about 53 years ago. Her smoking use included cigarettes. She has a 0.75 pack-year smoking history. She has never used smokeless tobacco. She reports that she does not drink alcohol and does not use drugs.   Family History:  Her family history includes Asthma in her sister; Breast cancer in her mother; Diabetes in her maternal grandmother and mother; Glaucoma in her maternal aunt; Heart disease in her maternal aunt and maternal grandmother; Kidney disease in her maternal grandmother. There is no history of Colon cancer, Stomach cancer, or Pancreatic cancer.   Allergies Allergies  Allergen Reactions   Ivp Dye [Iodinated Contrast Media] Shortness Of Breath    No reaction to PO contrast with non-ionic dye.06-25-2014/rsm   Shellfish Allergy Anaphylaxis   Sulfa Antibiotics Shortness Of Breath   Iodine Hives   Atorvastatin Other (See Comments)    Pt states "causes bilateral leg pain/cramps."   Benadryl [Diphenhydramine] Other (See Comments)    "THIS DRIVES ME CRAZY AND MAKES ME FEEL LIKE I AM DYING" No problem last time they took   Colchicine Nausea And Vomiting   Contrast Media [Iodinated Contrast Media] Hives   Doxycycline Other (See Comments)    Unknown reaction, per patient   Uloric [Febuxostat] Other (See Comments)    Unknown reaction   Cephalexin Itching and Rash   Zithromax [Azithromycin] Rash     Home Medications  Prior to Admission medications   Medication Sig Start Date End Date Taking? Authorizing  Provider  acetaminophen (TYLENOL) 500 MG tablet Take 1,000 mg by mouth 2 (two) times daily as needed (for pain or headaches).   Yes [provider]  albuterol (PROVENTIL HFA;VENTOLIN HFA) 108 (90 Base) MCG/ACT inhaler Inhale 2 puffs into the lungs every 6 (six) hours as needed for wheezing or shortness of breath. 05/02/18  Yes Fuller Plan A, MD  allopurinol (ZYLOPRIM) 100 MG tablet Take 1 tablet (100 mg total) by mouth daily. 03/14/21  Yes Croitoru, [name], MD  aspirin EC 81 MG tablet Take 1 tablet (81 mg total) by mouth daily. 03/27/16  Yes Kilroy, Luke K, PA-C  bisoprolol (ZEBETA) 5 MG tablet Take 1.5 tablets (7.5 mg total) by mouth daily. 03/18/21  Yes Milford, Maricela Bo, FNP  budesonide-formoterol (SYMBICORT) 160-4.5 MCG/ACT inhaler Inhale 2 puffs into the lungs 2 (two) times daily.   Yes [provider]  carboxymethylcellulose (REFRESH PLUS) 0.5 % SOLN Place 1 drop into both eyes every 12 (twelve) hours.   Yes [provider]  clopidogrel (PLAVIX) 75 MG tablet Take 1 tablet (75 mg total) by mouth daily. 02/28/21  Yes Croitoru, [name], MD  docusate sodium (COLACE) 100 MG capsule Take 100 mg by mouth at bedtime.   Yes [provider]  empagliflozin (JARDIANCE) 10 MG TABS tablet Take 10 mg by mouth daily before breakfast. 06/30/19  Yes Bensimhon, Shaune Pascal, MD  guaiFENesin (MUCINEX) 600 MG 12 hr tablet Take 1 tablet (600 mg total) by mouth 2 (two) times daily. Patient taking differently: Take 600 mg by mouth 2 (two) times daily as needed for cough. 01/10/21  Yes [name], NP  isosorbide mononitrate (IMDUR) 30 MG 24 hr tablet Take 90 mg by mouth daily.   Yes [provider]  loratadine (CLARITIN) 10 MG tablet Take 10 mg by mouth daily. 11/23/19  Yes [provider]  OXYGEN Inhale 3 L/min into the lungs continuous.    Yes  [provider]  oxymetazoline (AFRIN) 0.05 % nasal spray Place 1 spray into both nostrils 2 (two) times daily as needed  for congestion.   Yes [provider]  pantoprazole (PROTONIX) 40 MG tablet Take 1 tablet (40 mg total) by mouth 2 (two) times daily. 07/08/19  Yes Nandigam, Venia Minks, MD  polyethylene glycol (MIRALAX / GLYCOLAX) 17 g packet Take 17 g by mouth daily.   Yes [provider]  RESTASIS 0.05 % ophthalmic emulsion Place 1 drop into both eyes 2 (two) times daily. 03/19/21  Yes [provider]  rosuvastatin (CRESTOR) 40 MG tablet Take 1 tablet (40 mg total) by mouth every evening. NEED OV. 09/19/20  Yes [name], Laxman, MD  torsemide (DEMADEX) 20 MG tablet Take 60 mg by mouth 2 (two) times daily.   Yes [provider]  ACCU-CHEK AVIVA PLUS test strip Check blood sugar twice daily 10/19/19   [provider]  Accu-Chek Softclix Lancets lancets Check blood sugar twice daily 08/10/19   [provider]  albuterol (PROVENTIL) (2.5 MG/3ML) 0.083% nebulizer solution Take 3 mLs (2.5 mg total) by nebulization every 6 (six) hours as needed for wheezing or shortness of breath. Patient not taking: Reported on 04/03/2021 05/02/18   Norval Morton, MD  Alcohol Swabs (B-D SINGLE USE SWABS REGULAR) PADS  08/10/19   [provider]  EPINEPHrine 0.3 mg/0.3 mL IJ SOAJ injection Inject 0.3 mg into the muscle once as needed for anaphylaxis (severe allergic reaction). Patient not taking: Reported on 04/01/2021    [provider]  nitroGLYCERIN (NITROSTAT) 0.4 MG SL tablet Place 1 tablet (0.4 mg total) under the tongue every 5 (five) minutes as needed for chest pain. Patient not taking: Reported on 04/01/2021 11/13/16 03/18/22  Erlene Quan, PA-C  potassium chloride SA (KLOR-CON M) 20 MEQ tablet Take 1 tablet (20 mEq total) by mouth every morning. Patient not taking: Reported on 04/01/2021 03/23/21   Croitoru, Dani Gobble, MD  torsemide 40 MG TABS Take 80 mg by mouth 2 (two) times daily. Patient not taking: Reported on 04/03/2021 04/01/21 06/30/21  Sanda Klein, MD      Critical care time: 24 min    Richardson Landry Tallan Sandoz ACNP Acute Care Nurse Practitioner [place] Please consult Amion 04/12/2021, 12:35 PM

## 2021-04-12 NOTE — Progress Notes (Signed)
Patient transported on BIPAP to 6L40 without complications.  ?

## 2021-04-12 NOTE — Progress Notes (Signed)
HIPAA cpdewprd: Nyla ?

## 2021-04-12 NOTE — TOC Progression Note (Signed)
Transition of Care (TOC) - Progression Note  ? ? ?Patient Details  ?Name: PARMINDER TRAPANI ?MRN: [id number] ?Date of Birth: December 28, 1945 ? ?Transition of Care (TOC) CM/SW Contact  ?Joanne Chars, LCSW ?Phone Number: ?04/12/2021, 1:52 PM ? ?Clinical Narrative:  CSW spoke with son [name].  He is leaning towards choosing [facility], when the time comes.  CSW discussed PCS evaluation form and [name] does want CSW to submit to [employer] to initiate that process.  Contact info for [employer] given to son for follow up and paperwork faxed and placed on pt chart.    ? ? ? ?Expected Discharge Plan: [facility] ?Barriers to Discharge: Continued Medical Work up, SNF Pending bed offer ? ?Expected Discharge Plan and Services ?Expected Discharge Plan: [place] ?In-house Referral: Clinical Social Work ?  ?Post Acute Care Choice: [facility] ?Living arrangements for the past 2 months: [place] ?                ?  ?  ?  ?  ?  ?  ?  ?  ?  ?  ? ? ?Social Determinants of Health (SDOH) Interventions ?  ? ?Readmission Risk Interventions ?Readmission Risk Prevention Plan 01/10/2021  ?Transportation Screening Complete  ?PCP or Specialist Appt within 3-5 Days Complete  ?[place] or Home Care Consult Complete  ?Social Work Consult for [place] Planning/Counseling Complete  ?Palliative Care Screening Not Applicable  ?Medication Review Press photographer) Complete  ?Some recent data might be hidden  ? ? ?

## 2021-04-13 ENCOUNTER — Inpatient Hospital Stay (HOSPITAL_COMMUNITY): Payer: Medicare Other

## 2021-04-13 DIAGNOSIS — J9621 Acute and chronic respiratory failure with hypoxia: Secondary | ICD-10-CM | POA: Diagnosis not present

## 2021-04-13 DIAGNOSIS — R579 Shock, unspecified: Secondary | ICD-10-CM | POA: Diagnosis not present

## 2021-04-13 DIAGNOSIS — S72002A Fracture of unspecified part of neck of left femur, initial encounter for closed fracture: Secondary | ICD-10-CM

## 2021-04-13 DIAGNOSIS — N179 Acute kidney failure, unspecified: Secondary | ICD-10-CM

## 2021-04-13 DIAGNOSIS — D649 Anemia, unspecified: Secondary | ICD-10-CM | POA: Diagnosis not present

## 2021-04-13 DIAGNOSIS — L8990 Pressure ulcer of unspecified site, unspecified stage: Secondary | ICD-10-CM | POA: Insufficient documentation

## 2021-04-13 DIAGNOSIS — S32512A Fracture of superior rim of left pubis, initial encounter for closed fracture: Secondary | ICD-10-CM | POA: Diagnosis not present

## 2021-04-13 LAB — CBC WITH DIFFERENTIAL/PLATELET
Abs Immature Granulocytes: 0.18 10*3/uL — ABNORMAL HIGH (ref 0.00–0.07)
Basophils Absolute: 0.1 10*3/uL (ref 0.0–0.1)
Basophils Relative: 0 %
Eosinophils Absolute: 0 10*3/uL (ref 0.0–0.5)
Eosinophils Relative: 0 %
HCT: 29.5 % — ABNORMAL LOW (ref 36.0–46.0)
Hemoglobin: 8.9 g/dL — ABNORMAL LOW (ref 12.0–15.0)
Immature Granulocytes: 1 %
Lymphocytes Relative: 2 %
Lymphs Abs: 0.3 10*3/uL — ABNORMAL LOW (ref 0.7–4.0)
MCH: 28.8 pg (ref 26.0–34.0)
MCHC: 30.2 g/dL (ref 30.0–36.0)
MCV: 95.5 fL (ref 80.0–100.0)
Monocytes Absolute: 0.9 10*3/uL (ref 0.1–1.0)
Monocytes Relative: 6 %
Neutro Abs: 13.7 10*3/uL — ABNORMAL HIGH (ref 1.7–7.7)
Neutrophils Relative %: 91 %
Platelets: 208 10*3/uL (ref 150–400)
RBC: 3.09 MIL/uL — ABNORMAL LOW (ref 3.87–5.11)
RDW: 15.9 % — ABNORMAL HIGH (ref 11.5–15.5)
WBC: 15.3 10*3/uL — ABNORMAL HIGH (ref 4.0–10.5)
nRBC: 0.4 % — ABNORMAL HIGH (ref 0.0–0.2)

## 2021-04-13 LAB — GLUCOSE, CAPILLARY
Glucose-Capillary: 111 mg/dL — ABNORMAL HIGH (ref 70–99)
Glucose-Capillary: 110 mg/dL — ABNORMAL HIGH (ref 70–99)
Glucose-Capillary: 110 mg/dL — ABNORMAL HIGH (ref 70–99)
Glucose-Capillary: 109 mg/dL — ABNORMAL HIGH (ref 70–99)
Glucose-Capillary: 109 mg/dL — ABNORMAL HIGH (ref 70–99)
Glucose-Capillary: 112 mg/dL — ABNORMAL HIGH (ref 70–99)

## 2021-04-13 LAB — POCT I-STAT 7, (LYTES, BLD GAS, ICA,H+H)
Acid-base deficit: 1 mmol/L (ref 0.0–2.0)
Acid-base deficit: 1 mmol/L (ref 0.0–2.0)
Acid-base deficit: 1 mmol/L (ref 0.0–2.0)
Bicarbonate: 26.4 mmol/L (ref 20.0–28.0)
Bicarbonate: 24.9 mmol/L (ref 20.0–28.0)
Bicarbonate: 24.9 mmol/L (ref 20.0–28.0)
Calcium, Ion: 1.14 mmol/L — ABNORMAL LOW (ref 1.15–1.40)
Calcium, Ion: 1.17 mmol/L (ref 1.15–1.40)
Calcium, Ion: 1.2 mmol/L (ref 1.15–1.40)
HCT: 27 % — ABNORMAL LOW (ref 36.0–46.0)
HCT: 25 % — ABNORMAL LOW (ref 36.0–46.0)
HCT: 27 % — ABNORMAL LOW (ref 36.0–46.0)
Hemoglobin: 9.2 g/dL — ABNORMAL LOW (ref 12.0–15.0)
Hemoglobin: 8.5 g/dL — ABNORMAL LOW (ref 12.0–15.0)
Hemoglobin: 9.2 g/dL — ABNORMAL LOW (ref 12.0–15.0)
O2 Saturation: 97 %
O2 Saturation: 86 %
O2 Saturation: 96 %
Patient temperature: 98.4
Patient temperature: 97.7
Patient temperature: 98.7
Potassium: 4.9 mmol/L (ref 3.5–5.1)
Potassium: 4.6 mmol/L (ref 3.5–5.1)
Potassium: 4.7 mmol/L (ref 3.5–5.1)
Sodium: 136 mmol/L (ref 135–145)
Sodium: 135 mmol/L (ref 135–145)
Sodium: 136 mmol/L (ref 135–145)
TCO2: 28 mmol/L (ref 22–32)
TCO2: 26 mmol/L (ref 22–32)
TCO2: 26 mmol/L (ref 22–32)
pCO2 arterial: 56.1 mmHg — ABNORMAL HIGH (ref 32–48)
pCO2 arterial: 48.1 mmHg — ABNORMAL HIGH (ref 32–48)
pCO2 arterial: 48.4 mmHg — ABNORMAL HIGH (ref 32–48)
pH, Arterial: 7.281 — ABNORMAL LOW (ref 7.35–7.45)
pH, Arterial: 7.32 — ABNORMAL LOW (ref 7.35–7.45)
pH, Arterial: 7.319 — ABNORMAL LOW (ref 7.35–7.45)
pO2, Arterial: 101 mmHg (ref 83–108)
pO2, Arterial: 55 mmHg — ABNORMAL LOW (ref 83–108)
pO2, Arterial: 88 mmHg (ref 83–108)

## 2021-04-13 LAB — RENAL FUNCTION PANEL
Albumin: 2 g/dL — ABNORMAL LOW (ref 3.5–5.0)
Albumin: 1.9 g/dL — ABNORMAL LOW (ref 3.5–5.0)
Anion gap: 17 — ABNORMAL HIGH (ref 5–15)
Anion gap: 14 (ref 5–15)
BUN: 55 mg/dL — ABNORMAL HIGH (ref 8–23)
BUN: 42 mg/dL — ABNORMAL HIGH (ref 8–23)
CO2: 18 mmol/L — ABNORMAL LOW (ref 22–32)
CO2: 24 mmol/L (ref 22–32)
Calcium: 8.7 mg/dL — ABNORMAL LOW (ref 8.9–10.3)
Calcium: 8.9 mg/dL (ref 8.9–10.3)
Chloride: 103 mmol/L (ref 98–111)
Chloride: 100 mmol/L (ref 98–111)
Creatinine, Ser: 3.89 mg/dL — ABNORMAL HIGH (ref 0.44–1.00)
Creatinine, Ser: 2.84 mg/dL — ABNORMAL HIGH (ref 0.44–1.00)
GFR, Estimated: 11 mL/min — ABNORMAL LOW (ref >60)
GFR, Estimated: 17 mL/min — ABNORMAL LOW (ref >60)
Glucose, Bld: 122 mg/dL — ABNORMAL HIGH (ref 70–99)
Glucose, Bld: 126 mg/dL — ABNORMAL HIGH (ref 70–99)
Phosphorus: 5 mg/dL — ABNORMAL HIGH (ref 2.5–4.6)
Phosphorus: 3.9 mg/dL (ref 2.5–4.6)
Potassium: 5.3 mmol/L — ABNORMAL HIGH (ref 3.5–5.1)
Potassium: 4.9 mmol/L (ref 3.5–5.1)
Sodium: 138 mmol/L (ref 135–145)
Sodium: 138 mmol/L (ref 135–145)

## 2021-04-13 LAB — BASIC METABOLIC PANEL
Anion gap: 17 — ABNORMAL HIGH (ref 5–15)
BUN: 56 mg/dL — ABNORMAL HIGH (ref 8–23)
CO2: 18 mmol/L — ABNORMAL LOW (ref 22–32)
Calcium: 8.6 mg/dL — ABNORMAL LOW (ref 8.9–10.3)
Chloride: 101 mmol/L (ref 98–111)
Creatinine, Ser: 3.89 mg/dL — ABNORMAL HIGH (ref 0.44–1.00)
GFR, Estimated: 11 mL/min — ABNORMAL LOW (ref >60)
Glucose, Bld: 121 mg/dL — ABNORMAL HIGH (ref 70–99)
Potassium: 5.2 mmol/L — ABNORMAL HIGH (ref 3.5–5.1)
Sodium: 136 mmol/L (ref 135–145)

## 2021-04-13 LAB — APTT: aPTT: 21 seconds — ABNORMAL LOW (ref 24–36)

## 2021-04-13 LAB — MAGNESIUM: Magnesium: 2.5 mg/dL — ABNORMAL HIGH (ref 1.7–2.4)

## 2021-04-13 MED ORDER — MIDODRINE HCL 5 MG PO TABS
20.0000 mg | ORAL_TABLET | Freq: Three times a day (TID) | ORAL | Status: DC
  Administered 2021-04-13 (×2): 20 mg via ORAL
  Filled 2021-04-13 (×2): qty 4

## 2021-04-13 MED ORDER — PREDNISONE 10 MG PO TABS
10.0000 mg | ORAL_TABLET | Freq: Every day | ORAL | Status: DC
Start: 2021-04-13 — End: 2021-04-28
  Administered 2021-04-13 – 2021-04-28 (×16): 10 mg via ORAL
  Filled 2021-04-13 (×15): qty 1

## 2021-04-13 MED ORDER — HYDRALAZINE HCL 20 MG/ML IJ SOLN
5.0000 mg | INTRAMUSCULAR | Status: DC | PRN
  Administered 2021-04-13 – 2021-04-14 (×2): 5 mg via INTRAVENOUS
  Filled 2021-04-13 (×3): qty 1

## 2021-04-13 NOTE — Procedures (Signed)
Arterial Catheter Insertion Procedure Note ? ?[name]  ?[id number]  ?1945-05-01 ? ?Date:04/13/21  ?Time:11:28 AM  ? ? ?Provider Performing: Jacky Kindle  ? ? ?Procedure: Insertion of Arterial Line [phone]) with US guidance ([id number])  ? ?Indication(s) ?Blood pressure monitoring and/or need for frequent ABGs ? ?Consent ?Risks of the procedure as well as the alternatives and risks of each were explained to the patient and/or caregiver.  Consent for the procedure was obtained and is signed in the bedside chart ? ?Anesthesia ?None ? ? ?Time Out ?Verified patient identification, verified procedure, site/side was marked, verified correct patient position, special equipment/implants available, medications/allergies/relevant history reviewed, required imaging and test results available. ? ? ?Sterile Technique ?Maximal sterile technique including full sterile barrier drape, hand hygiene, sterile gown, sterile gloves, mask, hair covering, sterile ultrasound probe cover (if used). ? ? ?Procedure Description ?Area of catheter insertion was cleaned with chlorhexidine and draped in sterile fashion. With real-time ultrasound guidance an arterial catheter was placed into the right  Axillary  artery.  Appropriate arterial tracings confirmed on monitor.   ? ? ?Complications/Tolerance ?None; patient tolerated the procedure well. ? ? ?EBL ?Minimal ? ? ?Specimen(s) ?None ? ?

## 2021-04-13 NOTE — Progress Notes (Signed)
eLink Physician-Brief Progress Note ?Patient Name: SAIDE LANUZA ?DOB: 06/15/1945 ?MRN: [id number] ? ? ?Date of Service ? Patient on CRRT. Was given midodrine at 5:30ish with sbp 139 w/ an order to hold if sbp >130. Now she is hypertensive in the 180's. No PRNs.   ?HPI/Events of Note ? Discussed with RN, she is not in any pain from hip fracture.   ?eICU Interventions ? Prn hydralazine ordered.  ? ? ? ?Intervention Category ?Intermediate Interventions: Hypertension - evaluation and management ? ?Elmer Sow ?04/13/2021, 10:28 PM ?

## 2021-04-13 NOTE — Progress Notes (Signed)
? ?NAME:  CASSIOPEIA FLORENTINO, MRN:  [id number], DOB:  11/26/1945, LOS: 10 ?ADMISSION DATE:  04/03/2021, CONSULTATION DATE: 04/12/2021 ?REFERRING MD: Hospitalist, CHIEF COMPLAINT: Worsening respiratory distress ? ?History of Present Illness:  ?76 year old obese female with known OSA OHS O2 dependent at 3 L nasal cannula who fell and suffered a fracture left femur.  She developed worsening renal failure while in the hospital required a HD catheter that was tunneled and placed on 04/11/2021 per interventional radiology.  On 04/12/2021 she had worsening respiratory failure she was obtunded and was transferred to intensive care unit for further evaluation and treatment by pulmonary critical care.  She will most likely need intubation full mechanical ventilatory support and CRRT.  She is noted to be hypotensive is on midodrine may require vasopressor support ? ?Pertinent  Medical History  ? ?Past Medical History:  ?Diagnosis Date  ? ABDOMINAL PAIN -GENERALIZED 02/21/2010  ? Qualifier: Diagnosis of  By: Chester Holstein NP, Nevin Bloodgood    ? AICD (automatic cardioverter/defibrillator) present   ? downgraded to CRT-P in 2014  ? Anemia   ? Arthritis   ? Asthma   ? Atrial fibrillation ([place]) 10/24/2015  ? Questionable history of A Fib/A Flutter. On device interrogation on 9/7, showed 0.62mn of A Fib/A Flutter with 1% burden.  Device check in Jan 2018 showed no sustained AF (runs of less than 30 seconds). No anticoagulation indicated.  ? CAD (coronary artery disease)   ? a. 10/09/16 LHC: SVG->LAD patent, SVG->Diag patent, SVG->RCA patent, SVG->LCx occluded. EF 60%, b. 10/31/16 LHC DES to AV groove Circ, DES to intermed branch  ? Cellulitis and abscess of foot 12/2014  ? RT FOOT  ? CHF (congestive heart failure) ([place]   ? Chronic bronchitis ([place]   ? Chronic lower back pain   ? Complete heart block ([place] 06/22/2015  ? Diverticulosis   ? Facial numbness 02/2016  ? Fatty liver disease, nonalcoholic   ? Gastritis   ? GERD (gastroesophageal reflux disease)    ? Gout   ? H/O hiatal hernia   ? HEMORRHOIDS-EXTERNAL 02/21/2010  ? Qualifier: Diagnosis of  By: GChester HolsteinNP, PNevin Bloodgood   ? High cholesterol   ? Hyperplastic colon polyp   ? Hypertension   ? IBS (irritable bowel syndrome)   ? Internal hemorrhoids   ? INTERNAL HEMORRHOIDS WITHOUT MENTION COMP 04/12/2007  ? Qualifier: Diagnosis of  By: BOlevia PerchesMD, DLowella Bandy  ? Nonischemic cardiomyopathy ([place] 11/22/2011  ? Pt responded to BiV ICD- last EF 55-60% Sept 2017  ? Obesity   ? On home oxygen therapy   ? "2L at night" (10/31/2016)  ? OSA (obstructive sleep apnea)   ? "can't tolerate a mask" (10/30/2016)  ? PERSONAL HX COLONIC POLYPS 02/21/2010  ? Qualifier: Diagnosis of  By: GChester HolsteinNP, PNevin Bloodgood   ? Pneumonia   ? "couple times" (10/31/2016)  ? Presence of permanent cardiac pacemaker   ? 11/02/12 [employer] V273 INTUA PPM  ? Right facial numbness 03/05/2016  ? Shortness of breath   ? Stroke ([facility] 09/2020  ? TIA (transient ischemic attack)   ? "recently" (10/31/2016)  ? Type II diabetes mellitus ([place]   ? ? ? ?Significant Hospital Events: ?Including procedures, antibiotic start and stop dates in addition to other pertinent events   ?04/12/2021 transferred intensive care unit; CRRT started ? ?Interim History / Subjective:  ? ?Mental status and Bi-PAP synchrony improved over night. Denies pain. Tolerating CRRT well. ? ?On 3cc/hr levophed ? ?~1L pulled off  with CRRT yesterday.  ? ?Morning labs reviewed. CO2 down 61>56. Remainder of labs need to be drawn ? ?Objective   ?Blood pressure 114/61, pulse 88, temperature 98.4 ?F (36.9 ?C), temperature source Axillary, resp. rate (!) 21, height '5\' 3"'$  (1.6 m), weight 103.6 kg, SpO2 93 %. ?   ?Vent Mode: BIPAP;PCV ?FiO2 (%):  [50 %] 50 % ?Set Rate:  [20 bmp-22 bmp] 20 bmp ?PEEP:  [5 cmH20-6 cmH20] 5 cmH20  ? ?Intake/Output Summary (Last 24 hours) at 04/13/2021 0717 ?Last data filed at 04/13/2021 0700 ?Gross per 24 hour  ?Intake 50.13 ml  ?Output 1088 ml  ?Net -1037.87 ml  ? ? ?Filed Weights  ?  04/11/21 1705 04/11/21 1959 04/13/21 0500  ?Weight: 106.5 kg 105.5 kg 103.6 kg  ? ? ?Examination: ?General: chronically ill appearing female on BiPAP and CRRT, no distress ?Cardiac: RRR, extremities warm ?Pulm: breathing comfortably on BiPAP, lung sounds improved from yesterday ?GI: soft, non-distended, non-tender ?Skin: no rash or lesion. Tunneled HD line in the left upper chest--no surrounding erythema or edema.  ?Neuro: awake/alert/follows commands. Nods and shakes head appropriately to questions. Mentation seems improved from yesterday ? ?Resolved Hospital Problem list   ? ? ?Assessment & Plan:  ? ?Acute on chronic hypoxic and hypercapnic respiratory failure requiring NIPPV due to OSA/OHS and volume overload secondary to acute renal failure requiring RRT ?Acute metabolic encephalopathy secondary to above ?ABG and mentation improved this morning compared to yesterday. Continue volume removal with CRRT (UF goal 50-100cc/hr). Continue BiPAP.  ?Continue to support pressures with levophed. On low dose this morning and suspect she could be transitioned back to midodrine once off BiPAP. ?NPO for now until off BiPAP ? ?Post ORIF repair per orthopedics ?Per orthopedic ? ?Chronic anemia. Likely due to renal disease. Stable. Continue to monitor.  ? ?History of hypertension.  ?Continue holding home antihypertensives. ? ?Type 2 DM ?CBGs at goal. Will hold SSI due to NPO status.  ? ?Chronic diastolic heart failure.  ?Volume management per CRRT. GDMT on hold due to hypotension.  ? ?History of coronary disease status post coronary bypass grafting 2016 AICD is in place ?Complete heart block s/p CRT-P.  ? ? ?Best Practice (right click and "Reselect all SmartList Selections" daily)  ? ?Diet/type: NPO ?DVT prophylaxis: other ?GI prophylaxis: PPI ?Lines: Dialysis Catheter ?Foley:  Yes, and it is still needed ?Code Status:  full code ?Last date of multidisciplinary goals of care discussion [tbd] ? ?Mitzi Hansen, MD ?Internal  Medicine Resident PGY-3 ?[facility] ?04/13/2021 7:58 AM  ?  ? ? ?

## 2021-04-13 NOTE — Progress Notes (Signed)
RT note-Patient was removed from NIV and transitioned to HFNC at 20L/40%. Tolerating well, continue to monitor. ?

## 2021-04-13 NOTE — Progress Notes (Signed)
[facility] ?Progress Note ? ?Subjective: ?NOw on CRRT, all 4K 400/100/400 pre/D/post ?K 4.9, P 8.0 ?Negative 173m/h, 1L neg yesterday ?Remains on BIPAP ?Daughter at bedside, updated ? ?Vitals:  ? 04/13/21 0630 04/13/21 0645 04/13/21 0725 04/13/21 0800  ?BP: 114/61   (!) 120/104  ?Pulse: 94 88  76  ?Resp: 17 (!) 21  13  ?Temp:   97.7 ?F (36.5 ?C)   ?TempSrc:   Axillary   ?SpO2: 93% 93%  97%  ?Weight:      ?Height:      ? ? ?Exam: ?Gen obese, somnolent, deconditioned, on BiPAP ?RRR no RG ?Abd soft obese ntnd no mass or ascites +bs ?MS no joint effusions or deformity ?Ext trace LE/ UE edema ?IJ TDC noted ?  ?  ? Home meds include - zyloprim, asa, bisoprolol 75 qd, suymbicort, plavix, colace, jardiance, imdur, protonix, klorcon 20 qd, crestor, demadex 60 - 80 bid, prns/ vits/ supps ?  ?    UA 2/25 cloudy, large Hb, 100 prot, 0-5 rbd, 6-10 wbc ?    Una 14,  UCr 63 ?   Renal [place]owed echogenic kidneys w/o hydro (10.8/ 9.8 cm) w/ multiple cysts bilat and bilat cortical thinning ?     Aug - dec 2022 = 1.3- 1.8, eGFR 28- 44, CKD 3b ?      CXR 2/22 mild vasc congestion  ?       alb 2.9 ?  ?  ?Assessment/ Plan: ?AKI on CKD 3b -  ?b/l creat 1.3- 1.8, eGFR 28- 44, CKD 3b from aug - dec 2022. [facility] [place] [facility]   ?Creat here 1.6 on admission 2/22 in setting of a fall w/ hip fracture and surgery.  ?Has had low BP's post op. Suspect ATN.  ?Renal UKoreashowed no obstruction and echogenic kidneys. UA unremarkable.  ?Started midodrine. Nonoliguric but SCr cont to inc.   ?[facility] ?Started HD 04/11/21 transition to CRRT 3/3 with BiPAP dependence and AMS ?CRRT ?Started 3/3, all 4K, fixed dose heparin, 1059mh net neg ?COnt current settings for next 24h ?L hip fracture - sp nailing L hip 2/23 per ortho ?Hyperkalemia, resolved ?Anemia - stable CTM ?HTN - bp's soft to low-normal . Holding home BB, imdur and demadex. On TID Midodrine ?CAD sp CABG on DAPT ?SP PPM/ ICD ?DM2 ?Deconditioning ?AHRF currently on  BiPAP, per CCM ?  ?RyRexene AgentMD  ?04/13/2021, 8:26 AM ? ? ?Recent Labs  ?Lab 04/12/21 ?0233 04/12/21 ?1454 04/12/21 ?1600 04/13/21 ?044332?K 5.0 5.0 5.2* 4.9  ?BUN 66*  --  77*  --   ?CREATININE 5.00*  --  5.48*  --   ?ALBUMIN  --   --  1.9*  --   ?CALCIUM 8.2*  --  8.4*  --   ?PHOS  --   --  8.0*  --   ?HGB 9.0* 9.2*  --  9.2*  ? ? ?Inpatient medications: ? allopurinol  100 mg Oral Daily  ? aspirin EC  81 mg Oral Daily  ? chlorhexidine  15 mL Mouth Rinse BID  ? Chlorhexidine Gluconate Cloth  6 each Topical Daily  ? clopidogrel  75 mg Oral Daily  ? cosyntropin  0.25 mg Intravenous Once  ? cycloSPORINE  1 drop Both Eyes BID  ? loratadine  10 mg Oral Daily  ? mouth rinse  15 mL Mouth Rinse q12n4p  ? midodrine  10 mg Oral TID WC  ? mometasone-formoterol  2 puff  Inhalation BID  ? pantoprazole  40 mg Oral BID  ? polyethylene glycol  17 g Oral Daily  ? polyvinyl alcohol  1 drop Both Eyes Q12H  ? rosuvastatin  5 mg Oral QPM  ? senna  1 tablet Oral BID  ? ?  prismasol BGK 4/2.5 400 mL/hr at 04/13/21 0141  ?  prismasol BGK 4/2.5 400 mL/hr at 04/13/21 5973  ? sodium chloride    ? heparin 10,000 units/ 20 mL infusion syringe 500 Units/hr (04/12/21 1951)  ? norepinephrine (LEVOPHED) Adult infusion 2 mcg/min (04/13/21 0800)  ? prismasol BGK 4/2.5 1,000 mL/hr at 04/13/21 0608  ? ? ?acetaminophen, bisacodyl, fentaNYL (SUBLIMAZE) injection, guaiFENesin, heparin, heparin, HYDROcodone-acetaminophen, ondansetron (ZOFRAN) IV ? ? ? ? ? ? ?

## 2021-04-13 NOTE — Progress Notes (Signed)
Pt stable ?Left leg incisions ok ?Mild pain only with circumduction leg ?Df ok ?Left foot perfused ?Medical management ongoing ? ?

## 2021-04-13 NOTE — Progress Notes (Signed)
RT note- Oral medications given by RN and HFNC placed, patient can be aroused and took medication well. Will give a break from Bipap for 30 min and place Bipap back  ?

## 2021-04-13 NOTE — Progress Notes (Signed)
VASCULAR LAB ? ? ? ?Left lower extremity venous duplex has been performed. ? ?See CV proc for preliminary results. ? ? ?Mary-Anne Polizzi, RVT ?04/13/2021, 7:00 PM ? ?

## 2021-04-14 DIAGNOSIS — J9621 Acute and chronic respiratory failure with hypoxia: Secondary | ICD-10-CM | POA: Diagnosis not present

## 2021-04-14 DIAGNOSIS — J9622 Acute and chronic respiratory failure with hypercapnia: Secondary | ICD-10-CM | POA: Diagnosis not present

## 2021-04-14 DIAGNOSIS — S72002A Fracture of unspecified part of neck of left femur, initial encounter for closed fracture: Secondary | ICD-10-CM | POA: Diagnosis not present

## 2021-04-14 LAB — POCT I-STAT 7, (LYTES, BLD GAS, ICA,H+H)
Acid-Base Excess: 0 mmol/L (ref 0.0–2.0)
Acid-Base Excess: 0 mmol/L (ref 0.0–2.0)
Bicarbonate: 25.5 mmol/L (ref 20.0–28.0)
Bicarbonate: 26.5 mmol/L (ref 20.0–28.0)
Calcium, Ion: 1.26 mmol/L (ref 1.15–1.40)
Calcium, Ion: 1.28 mmol/L (ref 1.15–1.40)
HCT: 29 % — ABNORMAL LOW (ref 36.0–46.0)
HCT: 29 % — ABNORMAL LOW (ref 36.0–46.0)
Hemoglobin: 9.9 g/dL — ABNORMAL LOW (ref 12.0–15.0)
Hemoglobin: 9.9 g/dL — ABNORMAL LOW (ref 12.0–15.0)
O2 Saturation: 95 %
O2 Saturation: 93 %
Patient temperature: 97.5
Patient temperature: 98.3
Potassium: 4.4 mmol/L (ref 3.5–5.1)
Potassium: 4.3 mmol/L (ref 3.5–5.1)
Sodium: 136 mmol/L (ref 135–145)
Sodium: 135 mmol/L (ref 135–145)
TCO2: 27 mmol/L (ref 22–32)
TCO2: 28 mmol/L (ref 22–32)
pCO2 arterial: 45.2 mmHg (ref 32–48)
pCO2 arterial: 48.5 mmHg — ABNORMAL HIGH (ref 32–48)
pH, Arterial: 7.357 (ref 7.35–7.45)
pH, Arterial: 7.345 — ABNORMAL LOW (ref 7.35–7.45)
pO2, Arterial: 80 mmHg — ABNORMAL LOW (ref 83–108)
pO2, Arterial: 73 mmHg — ABNORMAL LOW (ref 83–108)

## 2021-04-14 LAB — CBC WITH DIFFERENTIAL/PLATELET
Abs Immature Granulocytes: 0.22 10*3/uL — ABNORMAL HIGH (ref 0.00–0.07)
Basophils Absolute: 0.1 10*3/uL (ref 0.0–0.1)
Basophils Relative: 0 %
Eosinophils Absolute: 0 10*3/uL (ref 0.0–0.5)
Eosinophils Relative: 0 %
HCT: 28.2 % — ABNORMAL LOW (ref 36.0–46.0)
Hemoglobin: 8.5 g/dL — ABNORMAL LOW (ref 12.0–15.0)
Immature Granulocytes: 1 %
Lymphocytes Relative: 3 %
Lymphs Abs: 0.4 10*3/uL — ABNORMAL LOW (ref 0.7–4.0)
MCH: 28.8 pg (ref 26.0–34.0)
MCHC: 30.1 g/dL (ref 30.0–36.0)
MCV: 95.6 fL (ref 80.0–100.0)
Monocytes Absolute: 0.9 10*3/uL (ref 0.1–1.0)
Monocytes Relative: 6 %
Neutro Abs: 13.9 10*3/uL — ABNORMAL HIGH (ref 1.7–7.7)
Neutrophils Relative %: 90 %
Platelets: 224 10*3/uL (ref 150–400)
RBC: 2.95 MIL/uL — ABNORMAL LOW (ref 3.87–5.11)
RDW: 15.9 % — ABNORMAL HIGH (ref 11.5–15.5)
WBC: 15.5 10*3/uL — ABNORMAL HIGH (ref 4.0–10.5)
nRBC: 0.4 % — ABNORMAL HIGH (ref 0.0–0.2)

## 2021-04-14 LAB — RENAL FUNCTION PANEL
Albumin: 1.8 g/dL — ABNORMAL LOW (ref 3.5–5.0)
Albumin: 2.1 g/dL — ABNORMAL LOW (ref 3.5–5.0)
Anion gap: 13 (ref 5–15)
Anion gap: 11 (ref 5–15)
BUN: 35 mg/dL — ABNORMAL HIGH (ref 8–23)
BUN: 29 mg/dL — ABNORMAL HIGH (ref 8–23)
CO2: 23 mmol/L (ref 22–32)
CO2: 24 mmol/L (ref 22–32)
Calcium: 9.1 mg/dL (ref 8.9–10.3)
Calcium: 9.4 mg/dL (ref 8.9–10.3)
Chloride: 101 mmol/L (ref 98–111)
Chloride: 100 mmol/L (ref 98–111)
Creatinine, Ser: 2.43 mg/dL — ABNORMAL HIGH (ref 0.44–1.00)
Creatinine, Ser: 1.87 mg/dL — ABNORMAL HIGH (ref 0.44–1.00)
GFR, Estimated: 20 mL/min — ABNORMAL LOW (ref >60)
GFR, Estimated: 27 mL/min — ABNORMAL LOW (ref >60)
Glucose, Bld: 114 mg/dL — ABNORMAL HIGH (ref 70–99)
Glucose, Bld: 159 mg/dL — ABNORMAL HIGH (ref 70–99)
Phosphorus: 2.8 mg/dL (ref 2.5–4.6)
Phosphorus: 2.3 mg/dL — ABNORMAL LOW (ref 2.5–4.6)
Potassium: 4.6 mmol/L (ref 3.5–5.1)
Potassium: 4.5 mmol/L (ref 3.5–5.1)
Sodium: 137 mmol/L (ref 135–145)
Sodium: 135 mmol/L (ref 135–145)

## 2021-04-14 LAB — GLUCOSE, CAPILLARY
Glucose-Capillary: 107 mg/dL — ABNORMAL HIGH (ref 70–99)
Glucose-Capillary: 107 mg/dL — ABNORMAL HIGH (ref 70–99)
Glucose-Capillary: 111 mg/dL — ABNORMAL HIGH (ref 70–99)
Glucose-Capillary: 132 mg/dL — ABNORMAL HIGH (ref 70–99)
Glucose-Capillary: 154 mg/dL — ABNORMAL HIGH (ref 70–99)
Glucose-Capillary: 78 mg/dL (ref 70–99)

## 2021-04-14 LAB — MAGNESIUM: Magnesium: 2.7 mg/dL — ABNORMAL HIGH (ref 1.7–2.4)

## 2021-04-14 LAB — APTT: aPTT: 38 seconds — ABNORMAL HIGH (ref 24–36)

## 2021-04-14 LAB — ACTH STIMULATION, 3 TIME POINTS
Cortisol, 30 Min: 48.4 ug/dL
Cortisol, 60 Min: 35.4 ug/dL
Cortisol, Base: 33.5 ug/dL

## 2021-04-14 MED ORDER — MIDODRINE HCL 5 MG PO TABS
10.0000 mg | ORAL_TABLET | Freq: Three times a day (TID) | ORAL | Status: DC
Start: 2021-04-14 — End: 2021-04-15
  Administered 2021-04-14: 10 mg via ORAL
  Filled 2021-04-14: qty 2

## 2021-04-14 NOTE — Progress Notes (Signed)
[facility] ?Progress Note ? ?Subjective: ?on CRRT, all 4K 400/100/400 pre/D/post ?Tol UF -151m/h/net neg ?BFR 250 ?Anuric ?K 4.6, P 2.8 ?2.2L neg yesterday ?Remains on/off BIPAP ?Family at bedside, updated ?BPs increasing, no pressors, on midodrine 20 TID ? ?Vitals:  ? 04/14/21 0800 04/14/21 0830 04/14/21 0833 04/14/21 0900  ?BP: (!) 163/72     ?Pulse: 69 69  71  ?Resp: _0 ?Temp: 98.2 ?F (36.8 ?C)     ?TempSrc: Axillary     ?SpO2: 97% 98% 97% 96%  ?Weight:      ?Height:      ? ? ?Exam: ?Gen obese, somnolent, deconditioned, on BiPAP ?RRR no RG ?Abd soft obese ntnd no mass or ascites +bs ?MS no joint effusions or deformity ?Ext trace LE/ UE edema ?IJ TDC noted ?  ?  ? Home meds include - zyloprim, asa, bisoprolol 75 qd, suymbicort, plavix, colace, jardiance, imdur, protonix, klorcon 20 qd, crestor, demadex 60 - 80 bid, prns/ vits/ supps ?  ?    UA 2/25 cloudy, large Hb, 100 prot, 0-5 rbd, 6-10 wbc ?    Una 14,  UCr 63 ?   Renal [place]ore[place]owed echogenic kidneys w/o hydro (10.8/ 9.8 cm) w/ multiple cysts bilat and bilat cortical thinning ?     Aug - dec 2022 = 1.3- 1.8, eGFR 28- 44, CKD 3b ?      CXR 2/22 mild vasc congestion  ?       alb 2.9 ?  ?  ?Assessment/ Plan: ?AKI on CKD 3b -  ?b/l creat 1.3- 1.8, eGFR 28- 44, CKD 3b from aug - dec 2022. [facility] [place] [facility]   ?Creat here 1.6 on admission 2/22 in setting of a fall w/ hip fracture and surgery.  ?Has had low BP's post op. Suspect ATN.  ?Renal UKoreashowed no obstruction and echogenic kidneys. UA unremarkable.  ?Started midodrine. Nonoliguric but SCr cont to inc.   ?[facility] ?Started HD 04/11/21 transition to CRRT 3/3 with BiPAP dependence and AMS ?CRRT ?Started 3/3, all 4K, fixed dose heparin, 1030mh net neg ?Bps improved, pt stabilized ?Stop CRRT today, run through day shift at 20066m net neg, stop before night shift ?Reasses tomorrow for iHD ?L hip fracture - sp nailing L hip 2/23 per ortho ?Hyperkalemia, resolved ?Anemia -  stable CTM ?HTN - Improved.  On TID Midodrine ?CAD sp CABG on DAPT ?SP PPM/ ICD ?DM2 ?Deconditioning ?AHRF currently on/off BiPAP, per CCM ?  ?RyaRexene AgentD  ?04/14/2021, 9:27 AM ? ? ?Recent Labs  ?Lab 04/13/21 ?1627 04/13/21 ?1734 04/14/21 ?0312637/05/23 ?[id number] 4.7 4.9  --  4.6  ?BUN  --  42*  --  35*  ?CREATININE  --  2.84*  --  2.43*  ?ALBUMIN  --  1.9*  --  1.8*  ?CALCIUM  --  8.9  --  9.1  ?PHOS  --  3.9  --  2.8  ?HGB 9.2*  --  8.5*  --   ? ? ?Inpatient medications: ? allopurinol  100 mg Oral Daily  ? aspirin EC  81 mg Oral Daily  ? chlorhexidine  15 mL Mouth Rinse BID  ? Chlorhexidine Gluconate Cloth  6 each Topical Daily  ? clopidogrel  75 mg Oral Daily  ? cycloSPORINE  1 drop Both Eyes BID  ? loratadine  10 mg Oral Daily  ? mouth rinse  15 mL Mouth Rinse q12n4p  ? midodrine  20 mg Oral TID WC  ? mometasone-formoterol  2 puff Inhalation BID  ? pantoprazole  40 mg Oral BID  ? polyethylene glycol  17 g Oral Daily  ? polyvinyl alcohol  1 drop Both Eyes Q12H  ? predniSONE  10 mg Oral Q breakfast  ? rosuvastatin  5 mg Oral QPM  ? senna  1 tablet Oral BID  ? ?  prismasol BGK 4/2.5 400 mL/hr at 04/14/21 0855  ?  prismasol BGK 4/2.5 400 mL/hr at 04/14/21 0857  ? sodium chloride    ? heparin 10,000 units/ 20 mL infusion syringe 500 Units/hr (04/13/21 1447)  ? norepinephrine (LEVOPHED) Adult infusion Stopped (04/13/21 1032)  ? prismasol BGK 4/2.5 1,000 mL/hr at 04/14/21 0608  ? ? ?acetaminophen, bisacodyl, fentaNYL (SUBLIMAZE) injection, guaiFENesin, heparin, heparin, hydrALAZINE, HYDROcodone-acetaminophen, ondansetron (ZOFRAN) IV ? ? ? ? ? ? ?

## 2021-04-14 NOTE — Progress Notes (Signed)
? ?NAME:  JUDIANN CELIA, MRN:  [id number], DOB:  21-Feb-1945, LOS: 51 ?ADMISSION DATE:  04/03/2021, CONSULTATION DATE: 04/12/2021 ?REFERRING MD: Hospitalist, CHIEF COMPLAINT: Worsening respiratory distress ? ?History of Present Illness:  ?76 year old obese female with known OSA OHS O2 dependent at 3 L nasal cannula who fell and suffered a fracture left femur.  She developed worsening renal failure while in the hospital required a HD catheter that was tunneled and placed on 04/11/2021 per interventional radiology.  On 04/12/2021 she had worsening respiratory failure she was obtunded and was transferred to intensive care unit for further evaluation and treatment by pulmonary critical care.  She will most likely need intubation full mechanical ventilatory support and CRRT.  She is noted to be hypotensive is on midodrine may require vasopressor support ? ?Pertinent  Medical History  ? ?Past Medical History:  ?Diagnosis Date  ? ABDOMINAL PAIN -GENERALIZED 02/21/2010  ? Qualifier: Diagnosis of  By: Chester Holstein NP, Nevin Bloodgood    ? AICD (automatic cardioverter/defibrillator) present   ? downgraded to CRT-P in 2014  ? Anemia   ? Arthritis   ? Asthma   ? Atrial fibrillation ([place]) 10/24/2015  ? Questionable history of A Fib/A Flutter. On device interrogation on 9/7, showed 0.75mn of A Fib/A Flutter with 1% burden.  Device check in Jan 2018 showed no sustained AF (runs of less than 30 seconds). No anticoagulation indicated.  ? CAD (coronary artery disease)   ? a. 10/09/16 LHC: SVG->LAD patent, SVG->Diag patent, SVG->RCA patent, SVG->LCx occluded. EF 60%, b. 10/31/16 LHC DES to AV groove Circ, DES to intermed branch  ? Cellulitis and abscess of foot 12/2014  ? RT FOOT  ? CHF (congestive heart failure) ([place]   ? Chronic bronchitis ([place]   ? Chronic lower back pain   ? Complete heart block ([place] 06/22/2015  ? Diverticulosis   ? Facial numbness 02/2016  ? Fatty liver disease, nonalcoholic   ? Gastritis   ? GERD (gastroesophageal reflux disease)    ? Gout   ? H/O hiatal hernia   ? HEMORRHOIDS-EXTERNAL 02/21/2010  ? Qualifier: Diagnosis of  By: GChester HolsteinNP, PNevin Bloodgood   ? High cholesterol   ? Hyperplastic colon polyp   ? Hypertension   ? IBS (irritable bowel syndrome)   ? Internal hemorrhoids   ? INTERNAL HEMORRHOIDS WITHOUT MENTION COMP 04/12/2007  ? Qualifier: Diagnosis of  By: BOlevia PerchesMD, DLowella Bandy  ? Nonischemic cardiomyopathy ([place] 11/22/2011  ? Pt responded to BiV ICD- last EF 55-60% Sept 2017  ? Obesity   ? On home oxygen therapy   ? "2L at night" (10/31/2016)  ? OSA (obstructive sleep apnea)   ? "can't tolerate a mask" (10/30/2016)  ? PERSONAL HX COLONIC POLYPS 02/21/2010  ? Qualifier: Diagnosis of  By: GChester HolsteinNP, PNevin Bloodgood   ? Pneumonia   ? "couple times" (10/31/2016)  ? Presence of permanent cardiac pacemaker   ? 11/02/12 [employer] V273 INTUA PPM  ? Right facial numbness 03/05/2016  ? Shortness of breath   ? Stroke ([facility] 09/2020  ? TIA (transient ischemic attack)   ? "recently" (10/31/2016)  ? Type II diabetes mellitus ([place]   ? ? ? ?Significant Hospital Events: ?Including procedures, antibiotic start and stop dates in addition to other pertinent events   ?04/12/2021 transferred intensive care unit; CRRT started ? ?Interim History / Subjective:  ? ?Mentation appropriate. No acute events overnight. Improving. Sister is at bedside. ? ?Objective   ?Blood pressure (!) 163/62, pulse 71,  temperature 98.2 ?F (36.8 ?C), temperature source Axillary, resp. rate (!) 22, height '5\' 3"'$  (1.6 m), weight 100.3 kg, SpO2 (!) 76 %. ?   ?Vent Mode: BIPAP ?FiO2 (%):  [40 %-50 %] 40 % ?Set Rate:  [14 bmp] 14 bmp ?PEEP:  [6 cmH20] 6 cmH20  ? ?Intake/Output Summary (Last 24 hours) at 04/14/2021 0720 ?Last data filed at 04/14/2021 0700 ?Gross per 24 hour  ?Intake 57.43 ml  ?Output 2346 ml  ?Net -2288.57 ml  ? ? ?Filed Weights  ? 04/11/21 1959 04/13/21 0500 04/14/21 0500  ?Weight: 105.5 kg 103.6 kg 100.3 kg  ? ? ?Examination: ?General: Initially sleeping easily aroused, no acute  distress. Age appropriate. ?Cardiac: RRR, normal heart sounds, no murmurs ?Respiratory: CTAB, normal effort. BiPAP in place ?Abdomen: soft, nontender, nondistended ?Extremities: No edema or cyanosis. ?Skin: Warm and dry, no rashes noted ?Neuro: alert and oriented, no focal deficits ?Psych: normal affect ? ?Resolved Hospital Problem list   ? ? ?Assessment & Plan:  ? ?Acute on chronic hypoxic and hypercapnic respiratory failure requiring NIPPV due to OSA/OHS and volume overload secondary to acute renal failure requiring RRT ?Acute metabolic encephalopathy secondary to above ?Continues to show improvement. Receiving CRRT, plan per renal is to remove this evening and start HD. Continue BiPAP. Off of levophed. Midodrine 20 mg tid. ?NPO for now until off BiPAP. Attempt to wean off BiPAP today. Hope to transfer out of ICU tomorrow. Needs BiPAP set up at home. Did have years prior according to sister but does not at this time.  ? ?Post ORIF repair per orthopedics ?Per orthopedic ? ?Chronic anemia. Likely due to renal disease. Stable at 8.5. Continue to monitor.  ? ?History of hypertension.  ?Continue holding home antihypertensives. ? ?Type 2 DM ?CBGs at goal. Will hold SSI due to NPO status.  ? ?Chronic diastolic heart failure.  ?Volume management per CRRT, plan to stop tonight. GDMT on hold due to hypotension.  ? ?History of coronary disease status post coronary bypass grafting 2016 AICD is in place ?Complete heart block s/p CRT-P.  ? ? ?Best Practice (right click and "Reselect all SmartList Selections" daily)  ? ?Diet/type: NPO ?DVT prophylaxis: other ?GI prophylaxis: PPI ?Lines: Dialysis Catheter ?Foley:  Yes, and it is still needed ?Code Status:  full code ?Last date of multidisciplinary goals of care discussion [tbd] ? ?Gerlene Fee, DO ?04/14/2021, 7:58 AM ?PGY-3, [place] ? ? ? ?

## 2021-04-14 NOTE — Progress Notes (Signed)
RT note- concern for patient being sleepy and requiring Bipap again, ABG drawn to check for adequate ventilation. ABG result were good and RN informed. Patient remains on HFNC at this time, will continue to monitor. ?

## 2021-04-15 DIAGNOSIS — D649 Anemia, unspecified: Secondary | ICD-10-CM | POA: Diagnosis not present

## 2021-04-15 DIAGNOSIS — N179 Acute kidney failure, unspecified: Secondary | ICD-10-CM | POA: Diagnosis not present

## 2021-04-15 DIAGNOSIS — J9621 Acute and chronic respiratory failure with hypoxia: Secondary | ICD-10-CM | POA: Diagnosis not present

## 2021-04-15 DIAGNOSIS — Z951 Presence of aortocoronary bypass graft: Secondary | ICD-10-CM | POA: Diagnosis not present

## 2021-04-15 LAB — RENAL FUNCTION PANEL
Albumin: 2 g/dL — ABNORMAL LOW (ref 3.5–5.0)
Anion gap: 11 (ref 5–15)
BUN: 41 mg/dL — ABNORMAL HIGH (ref 8–23)
CO2: 23 mmol/L (ref 22–32)
Calcium: 9.7 mg/dL (ref 8.9–10.3)
Chloride: 101 mmol/L (ref 98–111)
Creatinine, Ser: 2.42 mg/dL — ABNORMAL HIGH (ref 0.44–1.00)
GFR, Estimated: 20 mL/min — ABNORMAL LOW (ref >60)
Glucose, Bld: 130 mg/dL — ABNORMAL HIGH (ref 70–99)
Phosphorus: 2.5 mg/dL (ref 2.5–4.6)
Potassium: 4.4 mmol/L (ref 3.5–5.1)
Sodium: 135 mmol/L (ref 135–145)

## 2021-04-15 LAB — GLUCOSE, CAPILLARY
Glucose-Capillary: 115 mg/dL — ABNORMAL HIGH (ref 70–99)
Glucose-Capillary: 130 mg/dL — ABNORMAL HIGH (ref 70–99)
Glucose-Capillary: 144 mg/dL — ABNORMAL HIGH (ref 70–99)
Glucose-Capillary: 163 mg/dL — ABNORMAL HIGH (ref 70–99)
Glucose-Capillary: 126 mg/dL — ABNORMAL HIGH (ref 70–99)
Glucose-Capillary: 105 mg/dL — ABNORMAL HIGH (ref 70–99)

## 2021-04-15 LAB — APTT: aPTT: 34 seconds (ref 24–36)

## 2021-04-15 LAB — POCT I-STAT 7, (LYTES, BLD GAS, ICA,H+H)
Acid-Base Excess: 1 mmol/L (ref 0.0–2.0)
Bicarbonate: 26.4 mmol/L (ref 20.0–28.0)
Calcium, Ion: 1.23 mmol/L (ref 1.15–1.40)
HCT: 26 % — ABNORMAL LOW (ref 36.0–46.0)
Hemoglobin: 8.8 g/dL — ABNORMAL LOW (ref 12.0–15.0)
O2 Saturation: 99 %
Patient temperature: 97.3
Potassium: 4.4 mmol/L (ref 3.5–5.1)
Sodium: 134 mmol/L — ABNORMAL LOW (ref 135–145)
TCO2: 28 mmol/L (ref 22–32)
pCO2 arterial: 44.9 mmHg (ref 32–48)
pH, Arterial: 7.374 (ref 7.35–7.45)
pO2, Arterial: 133 mmHg — ABNORMAL HIGH (ref 83–108)

## 2021-04-15 LAB — MAGNESIUM: Magnesium: 2.7 mg/dL — ABNORMAL HIGH (ref 1.7–2.4)

## 2021-04-15 MED ORDER — HEPARIN SODIUM (PORCINE) 5000 UNIT/ML IJ SOLN
5000.0000 [IU] | Freq: Three times a day (TID) | INTRAMUSCULAR | Status: DC
  Administered 2021-04-15 – 2021-04-28 (×39): 5000 [IU] via SUBCUTANEOUS
  Filled 2021-04-15 (×39): qty 1

## 2021-04-15 MED ORDER — MIDODRINE HCL 5 MG PO TABS
10.0000 mg | ORAL_TABLET | Freq: Three times a day (TID) | ORAL | Status: DC
  Administered 2021-04-15 – 2021-04-17 (×5): 10 mg via ORAL
  Filled 2021-04-15 (×4): qty 2

## 2021-04-15 NOTE — Progress Notes (Signed)
Physical Therapy Treatment ?Patient Details ?Name: [name] ?MRN: [id number] ?DOB: 06-15-1945 ?Today's Date: 04/15/2021 ? ? ?History of Present Illness Pt is a 76 y.o. female admitted 04/03/21 after fall at home when leg gave out sustaining L displaced intertrochanteric hip fx. S/p L hip cephalomedullary nail and closed management of superior pubic ramus fx 2/23. On 3/3, pt with worsening respiratory status and encephalopathy, transferred to ICU and started on BiPAP. PMH includes afib, CAD, CHF, HTN, pacemaker, asthma, DM2, stroke, OSA, cervical sx, back sx, R ankle fx. ? ?  ?PT Comments  ? ? Pt glad to see therapy and eager to get out of bed to chair. Pt limited in safe mobility today by increased O2 demand, in presence of profound weakness and decreased motor planning. Pt requires mod Ax2 for bed mobility and total Ax2 for attempt to come to standing and total Ax2 for lateral scoot transfer from bed to drop arm recliner. D/c plans remain appropriate at this time. PT will continue to follow acutely. ?  ?Recommendations for follow up therapy are one component of a multi-disciplinary discharge planning process, led by the attending physician.  Recommendations may be updated based on patient status, additional functional criteria and insurance authorization. ? ?Follow Up Recommendations ? Skilled nursing-short term rehab (<3 hours/day) ?  ?  ?Assistance Recommended at Discharge Frequent or constant Supervision/Assistance  ?Patient can return home with the following Two people to help with walking and/or transfers;Two people to help with bathing/dressing/bathroom;Assistance with cooking/housework;Assist for transportation;Help with stairs or ramp for entrance ?  ?Equipment Recommendations ? Rolling walker (2 wheels);BSC/3in1;Wheelchair (measurements PT);Wheelchair cushion (measurements PT);Hospital bed  ?  ?   ?Precautions / Restrictions Precautions ?Precautions: Fall;Other (comment) ?Precaution Comments: Wears 3L  O2 baseline ?Restrictions ?Weight Bearing Restrictions: Yes ?LLE Weight Bearing: Weight bearing as tolerated  ?  ? ?Mobility ? Bed Mobility ?Overal bed mobility: Needs Assistance ?Bed Mobility: Supine to Sit, Rolling ?Rolling: Max assist ?  ?Supine to sit: Mod assist, +2 for physical assistance, +2 for safety/equipment, HOB elevated ?  ?  ?General bed mobility comments: able to assist in moving B LE to EOB, consistent cueing to use bedrails and assist to lift trunk/scoot to EOB. Max A to roll side to side in recliner for peri care (Mod A to roll to R side, Max A to L side) ?  ? ?Transfers ?Overall transfer level: Needs assistance ?Equipment used: 2 person hand held assist ?Transfers: Sit to/from Stand, Bed to chair/wheelchair/BSC ?  ?  ?  ?  ?  ? Lateral/Scoot Transfers: Total assist, +2 physical assistance, +2 safety/equipment ?General transfer comment: Pt unable to clear bottom and stand upright despite +2 assist. Opted for scoot/squat pivot to recliner with overall Total A x 2 due to difficulty motor planning and fatigue ?  ? ? ? ?  ?Balance Overall balance assessment: Needs assistance ?Sitting-balance support: Feet supported, Bilateral upper extremity supported ?Sitting balance-Leahy Scale: Fair ?  ?  ?Standing balance support: Bilateral upper extremity supported, During functional activity ?Standing balance-Leahy Scale: Zero ?  ?  ?  ?  ?  ?  ?  ?  ?  ?  ?  ?  ?  ? ?  ?Cognition Arousal/Alertness: Awake/alert ?Behavior During Therapy: Flat affect ?Overall Cognitive Status: Impaired/Different from baseline ?Area of Impairment: Following commands, Safety/judgement, Awareness, Problem solving ?  ?  ?  ?  ?  ?  ?  ?  ?  ?  ?  ?Following Commands: Follows one  step commands with increased time ?Safety/Judgement: Decreased awareness of safety, Decreased awareness of deficits ?Awareness: Intellectual ?Problem Solving: Slow processing, Decreased initiation, Difficulty sequencing, Requires verbal cues, Requires tactile  cues ?General Comments: slower to respond but responds appropriately. increased time to follow directions. decreased awareness of deficits as on entry, pt reports "I thought I was going home today" (reoriented to medical complexities and currently in ICU) ?  ?  ? ?  ?   ?General Comments General comments (skin integrity, edema, etc.): 25L O2 via HHFNC FiO2 50%, HR in high 120s with mobility ?  ?  ? ?Pertinent Vitals/Pain Pain Assessment ?Pain Assessment: Faces ?Faces Pain Scale: Hurts little more ?Pain Location: chronic back pain ?Pain Descriptors / Indicators: Discomfort, Grimacing, Guarding  ? ? ? ?PT Goals (current goals can now be found in the care plan section) Acute Rehab PT Goals ?PT Goal Formulation: With patient ?Time For Goal Achievement: 04/19/21 ?Potential to Achieve Goals: Good ?Progress towards PT goals: Progressing toward goals ? ?  ?Frequency ? ? ? Min 3X/week ? ? ? ?  ?PT Plan Current plan remains appropriate  ? ? ?Co-evaluation PT/OT/SLP Co-Evaluation/Treatment: Yes ?Reason for Co-Treatment: For patient/therapist safety ?PT goals addressed during session: Mobility/safety with mobility ?OT goals addressed during session: ADL's and self-care;Strengthening/ROM ?  ? ?  ?AM-PAC PT "6 Clicks" Mobility   ?Outcome Measure ? Help needed turning from your back to your side while in a flat bed without using bedrails?: Total ?Help needed moving from lying on your back to sitting on the side of a flat bed without using bedrails?: Total ?Help needed moving to and from a bed to a chair (including a wheelchair)?: Total ?Help needed standing up from a chair using your arms (e.g., wheelchair or bedside chair)?: Total ?Help needed to walk in hospital room?: Total ?Help needed climbing 3-5 steps with a railing? : Total ?6 Click Score: 6 ? ?  ?End of Session Equipment Utilized During Treatment: Oxygen ?Activity Tolerance: Patient tolerated treatment well;Patient limited by fatigue ?Patient left: in chair;with call  bell/phone within reach;with chair alarm set ?Nurse Communication: Mobility status ?PT Visit Diagnosis: Other abnormalities of gait and mobility (R26.89);Muscle weakness (generalized) (M62.81);Pain ?Pain - Right/Left: Left ?Pain - part of body: Leg ?  ? ? ?Time: 2694-8546 ?PT Time Calculation (min) (ACUTE ONLY): 34 min ? ?Charges:  $Therapeutic Activity: 8-22 mins          ?          ? ?Rionna Feltes B. Migdalia Dk PT, DPT ?Acute Rehabilitation Services ?Pager [phone] ?Office [phone] ? ? ? ?Bailey Mech Fleet ?04/15/2021, 12:59 PM ? ?

## 2021-04-15 NOTE — Progress Notes (Signed)
? ?NAME:  [name], MRN:  [id number], DOB:  April 30, 1945, LOS: 67 ?ADMISSION DATE:  04/03/2021, CONSULTATION DATE: 04/12/2021 ?REFERRING MD: Hospitalist, CHIEF COMPLAINT: Worsening respiratory distress ? ?History of Present Illness:  ?76 year old obese female with known OSA OHS O2 dependent at 3 L nasal cannula who fell and suffered a fracture left femur.  She developed worsening renal failure while in the hospital required a HD catheter that was tunneled and placed on 04/11/2021 per interventional radiology.  On 04/12/2021 she had worsening respiratory failure she was obtunded and was transferred to intensive care unit for further evaluation and treatment by pulmonary critical care.  She will most likely need intubation full mechanical ventilatory support and CRRT.  She is noted to be hypotensive is on midodrine may require vasopressor support ? ?Pertinent  Medical History  ? ?Past Medical History:  ?Diagnosis Date  ? ABDOMINAL PAIN -GENERALIZED 02/21/2010  ? Qualifier: Diagnosis of  By: Chester Holstein NP, Nevin Bloodgood    ? AICD (automatic cardioverter/defibrillator) present   ? downgraded to CRT-P in 2014  ? Anemia   ? Arthritis   ? Asthma   ? Atrial fibrillation ([place]) 10/24/2015  ? Questionable history of A Fib/A Flutter. On device interrogation on 9/7, showed 0.109mn of A Fib/A Flutter with 1% burden.  Device check in Jan 2018 showed no sustained AF (runs of less than 30 seconds). No anticoagulation indicated.  ? CAD (coronary artery disease)   ? a. 10/09/16 LHC: SVG->LAD patent, SVG->Diag patent, SVG->RCA patent, SVG->LCx occluded. EF 60%, b. 10/31/16 LHC DES to AV groove Circ, DES to intermed branch  ? Cellulitis and abscess of foot 12/2014  ? RT FOOT  ? CHF (congestive heart failure) ([facility]   ? Chronic bronchitis ([facility]   ? Chronic lower back pain   ? Complete heart block ([facility] 06/22/2015  ? Diverticulosis   ? Facial numbness 02/2016  ? Fatty liver disease, nonalcoholic   ? Gastritis   ? GERD (gastroesophageal reflux disease)    ? Gout   ? H/O hiatal hernia   ? HEMORRHOIDS-EXTERNAL 02/21/2010  ? Qualifier: Diagnosis of  By: GChester HolsteinNP, PNevin Bloodgood   ? High cholesterol   ? Hyperplastic colon polyp   ? Hypertension   ? IBS (irritable bowel syndrome)   ? Internal hemorrhoids   ? INTERNAL HEMORRHOIDS WITHOUT MENTION COMP 04/12/2007  ? Qualifier: Diagnosis of  By: BOlevia PerchesMD, DLowella Bandy  ? Nonischemic cardiomyopathy ([facility] 11/22/2011  ? Pt responded to BiV ICD- last EF 55-60% Sept 2017  ? Obesity   ? On home oxygen therapy   ? "2L at night" (10/31/2016)  ? OSA (obstructive sleep apnea)   ? "can't tolerate a mask" (10/30/2016)  ? PERSONAL HX COLONIC POLYPS 02/21/2010  ? Qualifier: Diagnosis of  By: GChester HolsteinNP, PNevin Bloodgood   ? Pneumonia   ? "couple times" (10/31/2016)  ? Presence of permanent cardiac pacemaker   ? 11/02/12 [employer] V273 INTUA PPM  ? Right facial numbness 03/05/2016  ? Shortness of breath   ? Stroke ([facility] 09/2020  ? TIA (transient ischemic attack)   ? "recently" (10/31/2016)  ? Type II diabetes mellitus ([facility]   ? ? ? ?Significant Hospital Events: ?Including procedures, antibiotic start and stop dates in addition to other pertinent events   ?04/12/2021 transferred intensive care unit; CRRT started ?3/4: improved mentation  ?3/5: CRRT stopped ?3/6: Transfer out of ICU ? ?Interim History / Subjective:  ? ?No significant overnight events. Son at bedside.  ? ?  Continues to cycle between BiPAP and HHF. ?Vital signs stable this morning.  ? ?No UOP overnight. Net negative 10L since admission. ? ?Labs reviewed. Creatinine 1.87>2.42; BUN 29>41. Mag 2.7. ? ?Objective   ?Blood pressure (!) 152/82, pulse 98, temperature 98.6 ?F (37 ?C), temperature source Axillary, resp. rate (!) 23, height '5\' 3"'$  (1.6 m), weight 96.7 kg, SpO2 97 %. ?   ?Vent Mode: BIPAP ?FiO2 (%):  [40 %-50 %] 40 % ?Set Rate:  [14 bmp-20 bmp] 14 bmp ?PEEP:  [6 cmH20] 6 cmH20  ? ?Intake/Output Summary (Last 24 hours) at 04/15/2021 0726 ?Last data filed at 04/14/2021 2200 ?Gross per 24 hour   ?Intake 310 ml  ?Output 2833 ml  ?Net -2523 ml  ? ? ?Filed Weights  ? 04/13/21 0500 04/14/21 0500 04/15/21 0321  ?Weight: 103.6 kg 100.3 kg 96.7 kg  ? ? ?Examination: ?General: chronically ill appearing in no distress ?Cardiac: regular rhythm, regular rate, no LE edema ?Pulm: breathing comfortably on BiPAP. Lung sounds diminished throughout ?GI: abd soft, non-tender ?Skin: dressing over bridge of nose clean and intact ?Neuro: lethargic, opens eyes to physical stimulation and nods/shakes her head to questions ? ?Resolved Hospital Problem list   ? ? ?Assessment & Plan:  ? ?Acute on chronic hypoxic and hypercapnic respiratory failure requiring intermittent NIPPV  ?Due to OSA/OHS and volume overload  ?She has been tolerating cycling between BiPAP and HHF.  ?Will need home BiPAP set up prior to discharge ? ?Acute metabolic encephalopathy secondary to hypercapnia and uremia ?Improved since arrival to ICU however may worsen again if renal function continues to decline.  ?Continue to monitor. Delirium precautions  ? ?Acute on chronic renal failure ?CRRT 3/3-3/5. Net negative 10L since admission. ?No UOP since CRRT was stopped yesterday. Creatinine and BUN have increased again. ?Continue strict I/Os. Monitor renal function.  ?Continue foley catheter.  ?Spoke with nephrology. Will continue to monitor in the ICU today. If renal function remains poor, will need RRT trial again tomorrow.  ? ?Post ORIF repair per orthopedics ?Per orthopedic ? ?Hypotension. resolved. Likely due to cor pulmonale in the setting of severe volume overload.  ?Continue midodrine  ? ?Adrenal insufficiency ?Continue home dose of prednisone '10mg'$  ? ?Chronic anemia. Likely due to renal disease. Continue to monitor.  ? ?LLE swelling.  ?3/4 DVT study negative.  ? ?History of hypertension.  ?Continue holding home antihypertensives. ? ?Type 2 DM ?CBGs at goal. Add SSI as needed ? ?Chronic diastolic heart failure.  ?GDMT on hold due to hypotension. Resume as  able ? ?History of coronary disease status post coronary bypass grafting 2016 AICD is in place ?Complete heart block s/p CRT-P.  ?Resume aspirin and plavix ? ?Best Practice (right click and "Reselect all SmartList Selections" daily)  ? ?Diet/type: Regular consistency (see orders) ?DVT prophylaxis: other ?GI prophylaxis: PPI ?Lines: Dialysis Catheter ?Foley:  Yes, and it is still needed ?Code Status:  full code ?Last date of multidisciplinary goals of care discussion [tbd] ? ?Mitzi Hansen, MD ?Internal Medicine Resident PGY-3 ?[facility] ?Pager: [phone] ?04/15/2021 8:01 AM  ?  ? ? ? ?

## 2021-04-15 NOTE — Progress Notes (Signed)
Pt taken off Bipap and placed on HHFNC 20L /50% to give pt a break.  ?

## 2021-04-15 NOTE — Progress Notes (Signed)
Occupational Therapy Treatment Patient Details Name: [name] MRN: [id number] DOB: [birth year] Today's Date: 04/15/2021   History of present illness Pt is a 76 y.o. female admitted 04/03/21 after fall at home when leg gave out sustaining L displaced intertrochanteric hip fx. S/p L hip cephalomedullary nail and closed management of superior pubic ramus fx 2/23. On 3/3, pt with worsening respiratory status and encephalopathy, transferred to ICU and started on BiPAP. PMH includes afib, CAD, CHF, HTN, pacemaker, asthma, DM2, stroke, OSA, cervical sx, back sx, R ankle fx.   OT comments  Pt motivated to get OOB this AM, improved direction-following and coordination noted today. However, pt continues to require extensive +2 assist for mobility and LB ADLs. Pt tolerating L LE ROM well though unable to achieve full standing today, opting for Total A x 2 scoot transfer to recliner. During transfer, pt experienced bowel incontinence and required Total A for cleanup though able to assist with UB ADLs at Mod A. Continue to rec SNF rehab at DC.  SpO2 >90% on 25 L HHFNC, 50% FiO2 HR 120s with activity   Recommendations for follow up therapy are one component of a multi-disciplinary discharge planning process, led by the attending physician.  Recommendations may be updated based on patient status, additional functional criteria and insurance authorization.    Follow Up Recommendations  Skilled nursing-short term rehab (<3 hours/day)    Assistance Recommended at Discharge Frequent or constant Supervision/Assistance  Patient can return home with the following  Two people to help with walking and/or transfers;Two people to help with bathing/dressing/bathroom;Assist for transportation;Help with stairs or ramp for entrance;Assistance with cooking/housework   Equipment Recommendations  None recommended by OT;Other (comment)    Recommendations for Other Services      Precautions / Restrictions  Precautions Precautions: Fall;Other (comment) Precaution Comments: Wears 3L O2 baseline Restrictions Weight Bearing Restrictions: Yes LLE Weight Bearing: Weight bearing as tolerated       Mobility Bed Mobility Overal bed mobility: Needs Assistance Bed Mobility: Supine to Sit, Rolling Rolling: Max assist   Supine to sit: Mod assist, +2 for physical assistance, +2 for safety/equipment, HOB elevated     General bed mobility comments: able to assist in moving B LE to EOB, consistent cueing to use bedrails and assist to lift trunk/scoot to EOB. Max A to roll side to side in recliner for peri care (Mod A to roll to R side, Max A to L side)    Transfers Overall transfer level: Needs assistance Equipment used: 2 person hand held assist Transfers: Sit to/from Stand, Bed to chair/wheelchair/BSC            Lateral/Scoot Transfers: Total assist, +2 physical assistance, +2 safety/equipment General transfer comment: Pt unable to clear bottom and stand upright despite +2 assist. Opted for scoot/squat pivot to recliner with overall Total A x 2 due to difficulty motor planning and fatigue     Balance Overall balance assessment: Needs assistance Sitting-balance support: Feet supported, Bilateral upper extremity supported Sitting balance-Leahy Scale: Fair     Standing balance support: Bilateral upper extremity supported, During functional activity Standing balance-Leahy Scale: Zero                             ADL either performed or assessed with clinical judgement   ADL Overall ADL's : Needs assistance/impaired     Grooming: Set up;Sitting;Wash/dry face   Upper Body Bathing: Moderate assistance;Cueing for safety;Cueing for sequencing;Bed level  Upper Body Bathing Details (indicate cue type and reason): assist to bathe back     Upper Body Dressing : Moderate assistance;Sitting Upper Body Dressing Details (indicate cue type and reason): to doff/don clean gown up in  chair Lower Body Dressing: Total assistance;Bed level Lower Body Dressing Details (indicate cue type and reason): to don socks               General ADL Comments: Fatigues quickly, improving cognition and good tolerance to L LE manuevering    Extremity/Trunk Assessment Upper Extremity Assessment Upper Extremity Assessment: RUE deficits/detail;LUE deficits/detail RUE Deficits / Details: Pt has WFL but decrease in coordination and increase in edema in BUE. RUE Coordination: decreased fine motor LUE Deficits / Details: Pt noted decrease in coordination of movements and edema LUE Coordination: decreased fine motor   Lower Extremity Assessment Lower Extremity Assessment: Defer to PT evaluation        Vision   Vision Assessment?: No apparent visual deficits   Perception     Praxis      Cognition Arousal/Alertness: Awake/alert Behavior During Therapy: Flat affect Overall Cognitive Status: Impaired/Different from baseline Area of Impairment: Following commands, Safety/judgement, Awareness, Problem solving                       Following Commands: Follows one step commands with increased time Safety/Judgement: Decreased awareness of safety, Decreased awareness of deficits Awareness: Intellectual Problem Solving: Slow processing, Decreased initiation, Difficulty sequencing, Requires verbal cues, Requires tactile cues General Comments: slower to respond but responds appropriately. increased time to follow directions. decreased awareness of deficits as on entry, pt reports "I thought I was going home today" (reoriented to medical complexities and currently in ICU)        Exercises      Shoulder Instructions       General Comments      Pertinent Vitals/ Pain       Pain Assessment Pain Assessment: Faces Faces Pain Scale: Hurts little more Pain Location: chronic back pain Pain Descriptors / Indicators: Discomfort, Grimacing, Guarding Pain Intervention(s):  Monitored during session, Limited activity within patient's tolerance  Home Living                                          Prior Functioning/Environment              Frequency  Min 2X/week        Progress Toward Goals  OT Goals(current goals can now be found in the care plan section)  Progress towards OT goals: Progressing toward goals  Acute Rehab OT Goals Patient Stated Goal: go home soon, get up in chair OT Goal Formulation: With patient Time For Goal Achievement: 04/20/21 Potential to Achieve Goals: Fair ADL Goals Pt Will Perform Eating: with set-up Pt Will Perform Grooming: with set-up Pt Will Perform Upper Body Dressing: with min assist;sitting Pt Will Perform Lower Body Dressing: with adaptive equipment;with mod assist;sitting/lateral leans Pt Will Transfer to Toilet: with mod assist;with +2 assist;stand pivot transfer;bedside commode  Plan Discharge plan remains appropriate    Co-evaluation    PT/OT/SLP Co-Evaluation/Treatment: Yes Reason for Co-Treatment: For patient/therapist safety;To address functional/ADL transfers   OT goals addressed during session: ADL's and self-care;Strengthening/ROM      AM-PAC OT "6 Clicks" Daily Activity     Outcome Measure   Help from another person eating meals?:  A Little Help from another person taking care of personal grooming?: A Little Help from another person toileting, which includes using toliet, bedpan, or urinal?: Total Help from another person bathing (including washing, rinsing, drying)?: A Lot Help from another person to put on and taking off regular upper body clothing?: A Lot Help from another person to put on and taking off regular lower body clothing?: Total 6 Click Score: 12    End of Session Equipment Utilized During Treatment: Oxygen;Gait belt  OT Visit Diagnosis: Unsteadiness on feet (R26.81);Other abnormalities of gait and mobility (R26.89);Muscle weakness (generalized)  (M62.81)   Activity Tolerance Patient limited by fatigue   Patient Left in chair;with call bell/phone within reach;with chair alarm set   Nurse Communication Mobility status;Need for lift equipment (lift pad under pt)        Time: 1010-1048 OT Time Calculation (min): 38 min  Charges: OT General Charges $OT Visit: 1 Visit OT Treatments $Self Care/Home Management : 8-22 mins  Malachy Chamber, OTR/L Acute Rehab Services Office: [phone]   Layla Maw 04/15/2021, 11:59 AM

## 2021-04-15 NOTE — Progress Notes (Addendum)
[facility] ROUNDING NOTE   Subjective:   Interval History: 76 year old lady with stage IIIb chronic kidney disease.  Admitted 04/03/2021 with  fall and hip fracture.  Low blood pressures postoperatively suspecting acute tubular necrosis.  Dialysis initiated 04/11/2021 with transition to CRRT 04/12/2021 with BiPAP dependent respiratory failure.  Previous history includes a history of CAD status post CABG. CRRT discontinued on 04/14/2021.  Looking to maybe transition to intermittent hemodialysis  Blood pressure 133/57 pulse 79 temperature 98.6 O2 sats 97% BiPAP 40%  Sodium 135 potassium 4.4 chloride 101 CO2 23 BUN 41 creatinine 2.42 glucose 130 calcium at 9.7 phosphorus 2.5 albumin 2.0 hemoglobin 9.9  Objective:  Vital signs in last 24 hours:  Temp:  [97.5 F (36.4 C)-98.8 F (37.1 C)] 98.6 F (37 C) (03/06 0321) Pulse Rate:  [69-108] 98 (03/06 0700) Resp:  [7-30] 23 (03/06 0700) BP: (145-168)/(63-108) 152/82 (03/06 0400) SpO2:  [87 %-100 %] 97 % (03/06 0700) Arterial Line BP: (104-161)/(48-78) 139/65 (03/06 0700) FiO2 (%):  [40 %-50 %] 40 % (03/06 0700) Weight:  [96.7 kg] 96.7 kg (03/06 0321)  Weight change: -3.6 kg Filed Weights   04/13/21 0500 04/14/21 0500 04/15/21 0321  Weight: 103.6 kg 100.3 kg 96.7 kg    Intake/Output: I/O last 3 completed shifts: In: 310 [P.O.:310] Out: 4021 [Urine:15; Other:4006]   Intake/Output this shift:  No intake/output data recorded.  Gen obese, somnolent, deconditioned, on BiPAP RRR no RG Abd soft obese ntnd no mass or ascites +bs MS no joint effusions or deformity Ext trace LE/ UE edema IJ [facility] noted   Basic Metabolic Panel: Recent Labs  Lab 04/13/21 0949 04/13/21 1134 04/13/21 1734 04/14/21 0317 04/14/21 0318 04/14/21 1225 04/14/21 1612 04/14/21 1833 04/15/21 0315 04/15/21 0320  NA 138   136   < > 138  --  137 136 135 135 135  --   K 5.3*   5.2*   < > 4.9  --  4.6 4.4 4.5 4.3 4.4  --   CL 103   101  --  100  --   101  --  100  --  101  --   CO2 18*   18*  --  24  --  23  --  24  --  23  --   GLUCOSE 122*   121*  --  126*  --  114*  --  159*  --  130*  --   BUN 55*   56*  --  42*  --  35*  --  29*  --  41*  --   CREATININE 3.89*   3.89*  --  2.84*  --  2.43*  --  1.87*  --  2.42*  --   CALCIUM 8.7*   8.6*  --  8.9  --  9.1  --  9.4  --  9.7  --   MG 2.5*  --   --  2.7*  --   --   --   --   --  2.7*  PHOS 5.0*  --  3.9  --  2.8  --  2.3*  --  2.5  --    < > = values in this interval not displayed.    Liver Function Tests: Recent Labs  Lab 04/13/21 0949 04/13/21 1734 04/14/21 0318 04/14/21 1612 04/15/21 0315  ALBUMIN 2.0* 1.9* 1.8* 2.1* 2.0*   No results for input(s): LIPASE, AMYLASE in the last 168 hours. No results for input(s): AMMONIA in  the last 168 hours.  CBC: Recent Labs  Lab 04/10/21 0523 04/11/21 0850 04/12/21 0233 04/12/21 1454 04/13/21 0949 04/13/21 1134 04/13/21 1627 04/14/21 0317 04/14/21 1225 04/14/21 1833  WBC 7.9 9.0 12.3*  --  15.3*  --   --  15.5*  --   --   NEUTROABS 6.6 7.7 10.8*  --  13.7*  --   --  13.9*  --   --   HGB 9.9* 10.5* 9.0*   < > 8.9* 8.5* 9.2* 8.5* 9.9* 9.9*  HCT 31.3* 32.8* 28.6*   < > 29.5* 25.0* 27.0* 28.2* 29.0* 29.0*  MCV 94.0 94.0 93.5  --  95.5  --   --  95.6  --   --   PLT 176 204 207  --  208  --   --  224  --   --    < > = values in this interval not displayed.    Cardiac Enzymes: No results for input(s): CKTOTAL, CKMB, CKMBINDEX, TROPONINI in the last 168 hours.  BNP: Invalid input(s): POCBNP  CBG: Recent Labs  Lab 04/14/21 1123 04/14/21 1614 04/14/21 1930 04/14/21 2333 04/15/21 0319  GLUCAP 107* 154* 132* 111* 115*    Microbiology: Results for orders placed or performed during the hospital encounter of 04/03/21  Resp Panel by RT-PCR (Flu A&B, Covid) Nasopharyngeal Swab     Status: None   Collection Time: 04/03/21  9:40 AM   Specimen: Nasopharyngeal Swab; Nasopharyngeal(NP) swabs in vial transport medium  Result  Value Ref Range Status   SARS Coronavirus 2 by RT PCR NEGATIVE NEGATIVE Final    Comment: (NOTE) SARS-CoV-2 target nucleic acids are NOT DETECTED.  The SARS-CoV-2 RNA is generally detectable in upper respiratory specimens during the acute phase of infection. The lowest concentration of SARS-CoV-2 viral copies this assay can detect is 138 copies/mL. A negative result does not preclude SARS-Cov-2 infection and should not be used as the sole basis for treatment or other patient management decisions. A negative result may occur with  improper specimen collection/handling, submission of specimen other than nasopharyngeal swab, presence of viral mutation(s) within the areas targeted by this assay, and inadequate number of viral copies(<138 copies/mL). A negative result must be combined with clinical observations, patient history, and epidemiological information. The expected result is Negative.  Fact Sheet for Patients:  EntrepreneurPulse.com.au  Fact Sheet for Healthcare Providers:  IncredibleEmployment.be  This test is no t yet approved or cleared by the [place] FDA and  has been authorized for detection and/or diagnosis of SARS-CoV-2 by FDA under an Emergency Use Authorization (EUA). This EUA will remain  in effect (meaning this test can be used) for the duration of the COVID-19 declaration under Section 564(b)(1) of the Act, 21 U.S.C.section 360bbb-3(b)(1), unless the authorization is terminated  or revoked sooner.       Influenza A by PCR NEGATIVE NEGATIVE Final   Influenza B by PCR NEGATIVE NEGATIVE Final    Comment: (NOTE) The Xpert Xpress SARS-CoV-2/FLU/RSV plus assay is intended as an aid in the diagnosis of influenza from Nasopharyngeal swab specimens and should not be used as a sole basis for treatment. Nasal washings and aspirates are unacceptable for Xpert Xpress SARS-CoV-2/FLU/RSV testing.  Fact Sheet for  Patients: EntrepreneurPulse.com.au  Fact Sheet for Healthcare Providers: IncredibleEmployment.be  This test is not yet approved or cleared by the [place] FDA and has been authorized for detection and/or diagnosis of SARS-CoV-2 by FDA under an Emergency Use Authorization (EUA). This EUA will  remain in effect (meaning this test can be used) for the duration of the COVID-19 declaration under Section 564(b)(1) of the Act, 21 U.S.C. section 360bbb-3(b)(1), unless the authorization is terminated or revoked.  Performed at [facility], [place] [address] [place], [place] [address]   Surgical PCR Screen     Status: None   Collection Time: 04/04/21  3:45 PM   Specimen: Nasal Mucosa; Nasal Swab  Result Value Ref Range Status   MRSA, PCR NEGATIVE NEGATIVE Final   Staphylococcus aureus NEGATIVE NEGATIVE Final    Comment: (NOTE) The Xpert SA Assay (FDA approved for NASAL specimens in patients 76 years of age and older), is one component of a comprehensive surveillance program. It is not intended to diagnose infection nor to guide or monitor treatment. Performed at [facility], [place] [address], [place], [place] [address]    *Note: Due to a large number of results and/or encounters for the requested time period, some results have not been displayed. A complete set of results can be found in Results Review.    Coagulation Studies: No results for input(s): LABPROT, INR in the last 72 hours.  Urinalysis: No results for input(s): COLORURINE, LABSPEC, PHURINE, GLUCOSEU, HGBUR, BILIRUBINUR, KETONESUR, PROTEINUR, UROBILINOGEN, NITRITE, LEUKOCYTESUR in the last 72 hours.  Invalid input(s): APPERANCEUR    Imaging: VAS [place] LOWER EXTREMITY VENOUS (DVT)  Result Date: 04/14/2021  Lower Venous DVT Study Patient Name:  [name]  Date of Exam:   04/13/2021 Medical Rec #: [id number]          Accession #:    [id number] Date of Birth: 1945/12/25            Patient Gender: F Patient Age:   32 years Exam Location:  [facility] Procedure:      VAS [place] LOWER EXTREMITY VENOUS (DVT) Referring Phys: Currie Paris CHAND --------------------------------------------------------------------------------  Limitations: Body habitus and Patient on CRRT, BiPap. Comparison Study: Negative left LEV done 02/14/21 Performing Technologist: Sharion Dove RVS  Examination Guidelines: A complete evaluation includes B-mode imaging, spectral Doppler, color Doppler, and power Doppler as needed of all accessible portions of each vessel. Bilateral testing is considered an integral part of a complete examination. Limited examinations for reoccurring indications may be performed as noted. The reflux portion of the exam is performed with the patient in reverse Trendelenburg.  Right Technical Findings: Right leg not evaluated.  +---------+---------------+---------+-----------+----------+-------------------+  LEFT      Compressibility Phasicity Spontaneity Properties Thrombus Aging       +---------+---------------+---------+-----------+----------+-------------------+  CFV       Full                                  pulsatile                       +---------+---------------+---------+-----------+----------+-------------------+  SFJ       Full                                                                  +---------+---------------+---------+-----------+----------+-------------------+  FV Prox   Full                                                                  +---------+---------------+---------+-----------+----------+-------------------+  FV Mid    Full                                                                  +---------+---------------+---------+-----------+----------+-------------------+  FV Distal                                                  patent by color and                                                              Doppler               +---------+---------------+---------+-----------+----------+-------------------+  PFV                                                        Not well visualized  +---------+---------------+---------+-----------+----------+-------------------+  POP       Full                                             pulsatile            +---------+---------------+---------+-----------+----------+-------------------+  PTV       Full                                                                  +---------+---------------+---------+-----------+----------+-------------------+  PERO      Full                                                                  +---------+---------------+---------+-----------+----------+-------------------+     Summary: LEFT: - There is no evidence of deep vein thrombosis in the lower extremity. However, portions of this examination were limited- see technologist comments above.  *See table(s) above for measurements and observations. Electronically signed by Orlie Pollen on 04/14/2021 at 10:14:19 AM.    Final      Medications:     prismasol BGK 4/2.5 Stopped (04/14/21 1755)    prismasol BGK 4/2.5 Stopped (04/14/21 1755)   sodium chloride     heparin 10,000 units/ 20 mL infusion syringe Stopped (04/14/21 1755)   prismasol BGK 4/2.5 Stopped (04/14/21 1755)    allopurinol  100 mg Oral Daily   aspirin EC  81  mg Oral Daily   chlorhexidine  15 mL Mouth Rinse BID   Chlorhexidine Gluconate Cloth  6 each Topical Daily   clopidogrel  75 mg Oral Daily   cycloSPORINE  1 drop Both Eyes BID   loratadine  10 mg Oral Daily   mouth rinse  15 mL Mouth Rinse q12n4p   midodrine  10 mg Oral TID WC   mometasone-formoterol  2 puff Inhalation BID   pantoprazole  40 mg Oral BID   polyethylene glycol  17 g Oral Daily   polyvinyl alcohol  1 drop Both Eyes Q12H   predniSONE  10 mg Oral Q breakfast   rosuvastatin  5 mg Oral QPM   senna  1 tablet Oral BID   acetaminophen, bisacodyl, fentaNYL (SUBLIMAZE)  injection, guaiFENesin, heparin, heparin, hydrALAZINE, HYDROcodone-acetaminophen, ondansetron (ZOFRAN) IV  Assessment/ Plan:  Acute on chronic kidney disease.  Baseline creatinine about 1.3 mg/dL.  Status post hip fracture now with hypotension and respiratory distress.  Patient placed on CRRT 04/12/2021 This was discontinued on 04/14/2021.  She may need to be transition to intermittent hemodialysis although historically did not tolerate.  She continues a full CODE STATUS. ANEMIA-no ESA's at this point. MBD-appears to be stable at this point. HTN/VOL-  She has little urine output.  She may need transition to intermittent dialysis. Status post hip fracture at 04/03/2021    LOS: [place] '@TODAY''@7'$ :36 AM

## 2021-04-15 NOTE — Progress Notes (Signed)
? ?  Subjective: ? ?Mental status continues to improve with HF cannula/BIPAP. Creatinine improving. ? ?Objective:  ? ?VITALS:   ?Vitals:  ? 04/15/21 0500 04/15/21 0600 04/15/21 0700 04/15/21 0739  ?BP:      ?Pulse: 80 80 98   ?Resp: 18 (!) 22 (!) 23   ?Temp:      ?TempSrc:      ?SpO2: 96% 96% 97% 96%  ?Weight:      ?Height:      ?Dressing is clean dry intact.  Is able to dorsiflex plantarflex the left foot.  Fires EHL gastrocsoleus.  Sensation is intact throughout.  2+ dorsalis pedis pulse ? ?Lab Results  ?Component Value Date  ? WBC 15.5 (H) 04/14/2021  ? HGB 9.9 (L) 04/14/2021  ? HCT 29.0 (L) 04/14/2021  ? MCV 95.6 04/14/2021  ? PLT 224 04/14/2021  ? ? ? ?Assessment/Plan: ? ?Status post left hip cephalomedullary nail 4/58, complicated by renal ATN in the setting of hypotension ? ?- Continue to monitor anemia, vitals ?- Patient to work with PT/OT to optimize mobilization ?- DVT ppx - SCDs, ambulation, okay with home anticoagulation aspirin Plavix ?- WBAT operative extremity ? Vanetta Mulders ?04/15/2021, 7:43 AM ? ?

## 2021-04-15 NOTE — Evaluation (Signed)
Clinical/Bedside Swallow Evaluation Patient Details  Name: [name] MRN: [id number] Date of Birth: 1945-08-29  Today's Date: 04/15/2021 Time: SLP Start Time (ACUTE ONLY): 1432 SLP Stop Time (ACUTE ONLY): 1448 SLP Time Calculation (min) (ACUTE ONLY): 16 min  Past Medical History:  Past Medical History:  Diagnosis Date   ABDOMINAL PAIN -GENERALIZED 02/21/2010   Qualifier: Diagnosis of  By: Wilmon Pali NP, Gunnar Fusi     AICD (automatic cardioverter/defibrillator) present    downgraded to CRT-P in 2014   Anemia    Arthritis    Asthma    Atrial fibrillation (HCC) 10/24/2015   Questionable history of A Fib/A Flutter. On device interrogation on 9/7, showed 0.11min of A Fib/A Flutter with 1% burden.  Device check in Jan 2018 showed no sustained AF (runs of less than 30 seconds). No anticoagulation indicated.   CAD (coronary artery disease)    a. 10/09/16 LHC: SVG->LAD patent, SVG->Diag patent, SVG->RCA patent, SVG->LCx occluded. EF 60%, b. 10/31/16 LHC DES to AV groove Circ, DES to intermed branch   Cellulitis and abscess of foot 12/2014   RT FOOT   CHF (congestive heart failure) (HCC)    Chronic bronchitis (HCC)    Chronic lower back pain    Complete heart block (HCC) 06/22/2015   Diverticulosis    Facial numbness 02/2016   Fatty liver disease, nonalcoholic    Gastritis    GERD (gastroesophageal reflux disease)    Gout    H/O hiatal hernia    HEMORRHOIDS-EXTERNAL 02/21/2010   Qualifier: Diagnosis of  By: Wilmon Pali NP, Paula     High cholesterol    Hyperplastic colon polyp    Hypertension    IBS (irritable bowel syndrome)    Internal hemorrhoids    INTERNAL HEMORRHOIDS WITHOUT MENTION COMP 04/12/2007   Qualifier: Diagnosis of  By: Juanda Chance MD, Dora M    Nonischemic cardiomyopathy (HCC) 11/22/2011   Pt responded to BiV ICD- last EF 55-60% Sept 2017   Obesity    On home oxygen therapy    "2L at night" (10/31/2016)   OSA (obstructive sleep apnea)    "can't tolerate a mask" (10/30/2016)    PERSONAL HX COLONIC POLYPS 02/21/2010   Qualifier: Diagnosis of  By: Wilmon Pali NP, Paula     Pneumonia    "couple times" (10/31/2016)   Presence of permanent cardiac pacemaker    11/02/12 [employer] V273 INTUA PPM   Right facial numbness 03/05/2016   Shortness of breath    Stroke (HCC) 09/2020   TIA (transient ischemic attack)    "recently" (10/31/2016)   Type II diabetes mellitus (HCC)    Past Surgical History:  Past Surgical History:  Procedure Laterality Date   ABDOMINAL ULTRASOUND  12/01/2011   Peripelvic cysts- #1- 2.4x1.9x2.3cm, #2-1.2x0.9x1.2cm   ANKLE FRACTURE SURGERY Right    "had rod put in"   ANTERIOR CERVICAL DECOMP/DISCECTOMY FUSION     APPENDECTOMY     BACK SURGERY     BIOPSY  12/29/2017   Procedure: BIOPSY;  Surgeon: Napoleon Form, MD;  Location: [facility];  Service: Endoscopy;;   BIV ICD INSERTION CRT-D  2001?   BIV PACEMAKER GENERATOR CHANGE OUT N/A 11/02/2012   Procedure: BIV PACEMAKER GENERATOR CHANGE OUT;  Surgeon: Thurmon Fair, MD;  Location: [facility]ATH [facility];  Service: Cardiovascular;  Laterality: N/A;   CARDIAC CATHETERIZATION  05/17/1999   No significant coronary obstructive disease w/ mild 20% luminal irregularity of the first diag branch of the LAD   CARDIAC CATHETERIZATION  07/08/2002  No significant CAD, moderately depressed LV systolic function   CARDIAC CATHETERIZATION Bilateral 04/26/2007   Normal findings, recommend medical therapy   CARDIAC CATHETERIZATION  02/18/2008   Moderate CAD, would benefit from having a functional study, recommend continue medical therapy   CARDIAC CATHETERIZATION  07/23/2012   Medical therapy   CARDIAC CATHETERIZATION N/A 11/24/2014   Procedure: Left Heart Cath and Coronary Angiography;  Surgeon: Lennette Bihari, MD;  Location: [facility] INVASIVE CV LAB;  Service: Cardiovascular;  Laterality: N/A;   CARDIAC CATHETERIZATION  11/27/2014   Procedure: Intravascular Pressure Wire/FFR Study;  Surgeon: Peter M Swaziland, MD;   Location: [facility] INVASIVE CV LAB;  Service: Cardiovascular;;   CARDIAC CATHETERIZATION  10/09/2016   CHOLECYSTECTOMY N/A 04/09/2018   Procedure: LAPAROSCOPIC CHOLECYSTECTOMY;  Surgeon: Gaynelle Adu, MD;  Location: [facility] ORS;  Service: General;  Laterality: N/A;   COLONOSCOPY WITH PROPOFOL N/A 12/29/2017   Procedure: COLONOSCOPY WITH PROPOFOL;  Surgeon: Napoleon Form, MD;  Location: [facility];  Service: Endoscopy;  Laterality: N/A;   CORONARY ANGIOGRAM  2010   CORONARY ARTERY BYPASS GRAFT N/A 11/29/2014   Procedure: CORONARY ARTERY BYPASS GRAFTING x 5 (LIMA-LAD, SVG-D, SVG-OM1-OM2, SVG-PD);  Surgeon: Loreli Slot, MD;  Location: [facility] OR;  Service: Open Heart Surgery;  Laterality: N/A;   CORONARY STENT INTERVENTION N/A 10/31/2016   Procedure: CORONARY STENT INTERVENTION;  Surgeon: Kathleene Hazel, MD;  [facility] INVAS[facility] CV LAB;  Service: Cardiovascular;  Laterality: N/A;   ESOPHAGOGASTRODUODENOSCOPY (EGD) WITH PROPOFOL N/A 12/29/2017   Procedure: ESOPHAGOGASTRODUODENOSCOPY (EGD) WITH PROPOFOL;  Surgeon: Napoleon Form, MD;  Location: [facility];  Service: Endoscopy;  Laterality: N/A;   FRACTURE SURGERY     INSERT / REPLACE / REMOVE PACEMAKER  1999   INTRAMEDULLARY (IM) NAIL INTERTROCHANTERIC Left 04/04/2021   Procedure: INTRAMEDULLARY (IM) NAIL INTERTROCHANTRIC;  Surgeon: Huel Cote, MD;  [facility] OR;  Service: Orthopedics;  Laterality: Left;   IR FLUORO GUIDE CV LINE LEFT  04/11/2021   IR US GUIDE VASC ACCESS LEFT  04/11/2021   KNEE ARTHROSCOPY Bilateral    LEFT HEART CATH AND CORS/GRAFTS ANGIOGRAPHY N/A 12/09/2017   Procedure: LEFT HEART CATH AND CORS/GRAFTS ANGIOGRAPHY;  Surgeon: Lennette Bihari, MD;  [facility] INVAS[facility] CV LAB;  Service: Cardiovascular;  Laterality: N/A;   LEFT HEART CATHETERIZATION WITH CORONARY ANGIOGRAM N/A 07/23/2012   Procedure: LEFT HEART CATHETERIZATION WITH CORONARY ANGIOGRAM;  Surgeon: Marykay Lex, MD;  Location: [facility] CATH LAB;   Service: Cardiovascular;  Laterality: N/A;   LEXISCAN MYOVIEW  11/14/2011   Mild-moderate defect seen in Mid Inferolateral and Mid Anterolateral regions-consistant w/ infarct/scar. No significant ischemia demonstrated.   POLYPECTOMY  12/29/2017   Procedure: POLYPECTOMY;  Surgeon: Napoleon Form, MD;  Location: [facility];  Service: Endoscopy;;   PPM GENERATOR CHANGEOUT N/A 12/31/2020   Procedure: PPM GENERATOR CHANGEOUT;  Surgeon: Thurmon Fair, MD;  [facility] INVAS[facility] CV LAB;  Service: Cardiovascular;  Laterality: N/A;   RIGHT/LEFT HEART CATH AND CORONARY ANGIOGRAPHY N/A 10/09/2016   Procedure: RIGHT/LEFT HEART CATH AND CORONARY ANGIOGRAPHY;  Surgeon: Dolores Patty, MD;  [facility]VAS[facility] CV LAB;  Service: Cardiovascular;  Laterality: N/A;   RIGHT/LEFT HEART CATH AND CORONARY/GRAFT ANGIOGRAPHY N/A 03/11/2019   Procedure: RIGHT/LEFT HEART CATH AND CORONARY/GRAFT ANGIOGRAPHY;  Surgeon: Dolores Patty, MD;  [facility]VAS[facility] CV LAB;  Service: Cardiovascular;  Laterality: N/A;   TEE WITHOUT CARDIOVERSION N/A 11/29/2014   Procedure: TRANSESOPHAGEAL ECHOCARDIOGRAM (TEE);  Surgeon: Loreli Slot, MD;  Location: [facility] OR;  Service: Open Heart Surgery;  Laterality: N/A;   TRANSCAROTID ARTERY REVASCULARIZATION  Right 10/01/2020   Procedure: RIGHT TRANSCAROTID ARTERY REVASCULARIZATION;  Surgeon: Cephus Shelling, MD;  Location: [facility] OR;  Service: Vascular;  Laterality: Right;   TRANSTHORACIC ECHOCARDIOGRAM  07/23/2012   EF 55-60%, normal-mild   TUBAL LIGATION     ULTRASOUND GUIDANCE FOR VASCULAR ACCESS Left 10/01/2020   Procedure: ULTRASOUND GUIDANCE FOR VASCULAR ACCESS;  Surgeon: Cephus Shelling, MD;  Location: [facility] OR;  Service: Vascular;  Laterality: Left;   HPI:  Pt is a 76 y.o. female admitted 04/03/21 after fall at home when leg gave out sustaining L displaced intertrochanteric hip fx. S/p L hip cephalomedullary nail and closed management of superior pubic ramus fx  2/23. On 3/3, pt with worsening respiratory status and encephalopathy, transferred to ICU and started on BiPAP. PMH includes afib, CAD, CHF, HTN, pacemaker, asthma, DM2, stroke, OSA, cervical sx, back sx, R ankle fx. OP MBS 12/05/20 due to reports of pill dysphagia, occasional solid food dysphagia. MBS revealed mild pharyngeal dysphagia with trace penetration of thin liquids to vocal folds x1; chin tuck prevented penetration per report. Esophagram same day found stricture of distal esophagus with recs for endoscopy. No indication that there was a f/u EGD.    Assessment / Plan / Recommendation  Clinical Impression  Pt sleepy but participatory.  Oral mechanism exam was normal; she is edentulous (dentures present but not in place).  She demonstrated initial coughing after first sips of water; no further coughing.  Intermittent belching noted throughout assessment.  Demonstrates good awareness of POs; adequate coordination of swallowing/respiratory cycles. She could not recall results or recs from [facility] and barium swallow back in October.  While in ICU, recommend continuing current diet; give meds whole in puree or crush if that is permissable. Spoke with her son at bedside that she may want to f/u with Dr. Lavon Paganini post-D/C to determine if repeated EGD/esophageal dilation is necessary. Will follow briefly while admitted. D/W RN. SLP Visit Diagnosis: Dysphagia, pharyngoesophageal phase (R13.14)    Aspiration Risk    mild   Diet Recommendation   Continue renal diet, regular solids, thin liquids  Medication Administration: Crushed with puree    Other  Recommendations Oral Care Recommendations: Oral care BID    Recommendations for follow up therapy are one component of a multi-disciplinary discharge planning process, led by the attending physician.  Recommendations may be updated based on patient status, additional functional criteria and insurance authorization.  Follow up Recommendations Other (comment)  (tba)              Frequency and Duration min 2x/week  1 week       Prognosis        Swallow Study   General HPI: Pt is a 76 y.o. female admitted 04/03/21 after fall at home when leg gave out sustaining L displaced intertrochanteric hip fx. S/p L hip cephalomedullary nail and closed management of superior pubic ramus fx 2/23. On 3/3, pt with worsening respiratory status and encephalopathy, transferred to ICU and started on BiPAP. PMH includes afib, CAD, CHF, HTN, pacemaker, asthma, DM2, stroke, OSA, cervical sx, back sx, R ankle fx. OP MBS 12/05/20 due to reports of pill dysphagia, occasional solid food dysphagia. MBS revealed mild pharyngeal dysphagia with trace penetration of thin liquids to vocal folds x1; chin tuck prevented penetration per report. Esophagram same day found stricture of distal esophagus with recs for endoscopy. No indication that there was a f/u EGD.  Type of Study: Bedside Swallow Evaluation Previous Swallow Assessment: see HPI Diet Prior to this Study: Regular;Thin liquids Temperature Spikes Noted: No Respiratory Status: Other (comment) (20L HHFNC) History of Recent Intubation: No Behavior/Cognition: Lethargic/Drowsy Oral Cavity Assessment: Within Functional Limits Oral Care Completed by SLP: No Oral Cavity - Dentition: Edentulous Self-Feeding Abilities: Needs assist Patient Positioning: Upright in chair Baseline Vocal Quality: Normal Volitional Cough: Congested Volitional Swallow: Able to elicit    Oral/Motor/Sensory Function Overall Oral Motor/Sensory Function: Within functional limits   Ice Chips Ice chips: Within functional limits   Thin Liquid Thin Liquid: Within functional limits    Nectar Thick Nectar Thick Liquid: Not tested   Honey Thick Honey Thick Liquid: Not tested   Puree Puree: Within functional limits   Solid     Solid:  (declined)     Lonnie Rosado L. Samson Frederic, MA CCC/SLP Acute Rehabilitation Services Office number [phone] Pager  [phone]  Blenda Mounts Laurice 04/15/2021,3:00 PM

## 2021-04-16 DIAGNOSIS — D696 Thrombocytopenia, unspecified: Secondary | ICD-10-CM | POA: Diagnosis not present

## 2021-04-16 DIAGNOSIS — N179 Acute kidney failure, unspecified: Secondary | ICD-10-CM | POA: Diagnosis not present

## 2021-04-16 DIAGNOSIS — S72002A Fracture of unspecified part of neck of left femur, initial encounter for closed fracture: Secondary | ICD-10-CM | POA: Diagnosis not present

## 2021-04-16 DIAGNOSIS — J9621 Acute and chronic respiratory failure with hypoxia: Secondary | ICD-10-CM | POA: Diagnosis not present

## 2021-04-16 LAB — RENAL FUNCTION PANEL
Albumin: 1.9 g/dL — ABNORMAL LOW (ref 3.5–5.0)
Albumin: 1.8 g/dL — ABNORMAL LOW (ref 3.5–5.0)
Anion gap: 11 (ref 5–15)
Anion gap: 10 (ref 5–15)
BUN: 75 mg/dL — ABNORMAL HIGH (ref 8–23)
BUN: 90 mg/dL — ABNORMAL HIGH (ref 8–23)
CO2: 23 mmol/L (ref 22–32)
CO2: 23 mmol/L (ref 22–32)
Calcium: 9.2 mg/dL (ref 8.9–10.3)
Calcium: 9.2 mg/dL (ref 8.9–10.3)
Chloride: 98 mmol/L (ref 98–111)
Chloride: 97 mmol/L — ABNORMAL LOW (ref 98–111)
Creatinine, Ser: 3.58 mg/dL — ABNORMAL HIGH (ref 0.44–1.00)
Creatinine, Ser: 4.14 mg/dL — ABNORMAL HIGH (ref 0.44–1.00)
GFR, Estimated: 13 mL/min — ABNORMAL LOW (ref >60)
GFR, Estimated: 11 mL/min — ABNORMAL LOW (ref >60)
Glucose, Bld: 109 mg/dL — ABNORMAL HIGH (ref 70–99)
Glucose, Bld: 193 mg/dL — ABNORMAL HIGH (ref 70–99)
Phosphorus: 3.3 mg/dL (ref 2.5–4.6)
Phosphorus: 3.5 mg/dL (ref 2.5–4.6)
Potassium: 4.5 mmol/L (ref 3.5–5.1)
Potassium: 5.1 mmol/L (ref 3.5–5.1)
Sodium: 132 mmol/L — ABNORMAL LOW (ref 135–145)
Sodium: 130 mmol/L — ABNORMAL LOW (ref 135–145)

## 2021-04-16 LAB — GLUCOSE, CAPILLARY
Glucose-Capillary: 111 mg/dL — ABNORMAL HIGH (ref 70–99)
Glucose-Capillary: 138 mg/dL — ABNORMAL HIGH (ref 70–99)
Glucose-Capillary: 149 mg/dL — ABNORMAL HIGH (ref 70–99)
Glucose-Capillary: 163 mg/dL — ABNORMAL HIGH (ref 70–99)
Glucose-Capillary: 180 mg/dL — ABNORMAL HIGH (ref 70–99)
Glucose-Capillary: 80 mg/dL (ref 70–99)

## 2021-04-16 LAB — MAGNESIUM: Magnesium: 2.8 mg/dL — ABNORMAL HIGH (ref 1.7–2.4)

## 2021-04-16 LAB — APTT: aPTT: 33 seconds (ref 24–36)

## 2021-04-16 MED ORDER — CHLORHEXIDINE GLUCONATE CLOTH 2 % EX PADS: 6.0000 | MEDICATED_PAD | Freq: Every day | CUTANEOUS | Status: DC

## 2021-04-16 NOTE — Progress Notes (Signed)
Pt has PRN BIPAP orders, pt currently on HHFNC 15L /30%. Bipap on standby if needed.  ?

## 2021-04-16 NOTE — Progress Notes (Signed)
Speech Language Pathology Treatment: Dysphagia  ?Patient Details ?Name: [name] ?MRN: [id number] ?DOB: [birth year] ?Today's Date: 04/16/2021 ?Time: 1470-9295 ?SLP Time Calculation (min) (ACUTE ONLY): 13 min ? ?Assessment / Plan / Recommendation ?Clinical Impression ? F/u after yesterday's initial assessment. RN reports good toleration of bfast. Pt accepted POs with SLP - intermittent belching present; no s/s of aspiration with any consistencies.  No cueing needed. Recommend continuing regular solids to allow more food choices; crush meds; thin liquids (she is on fluid restrictions). Avoid tough meats/breads given esophageal deficits.  ? ?Per chart review, Dr Eliott Nine, recommended holding on EGD s/p 10/26 MBS and barium swallow as pt was managing her symptoms with dietary modifications. If esophageal symptoms recur, please consult GI. ? ?No further SLP f/u is necessary.   Our service will sign off.  ?  ?HPI HPI: Pt is a 76 y.o. female admitted 04/03/21 after fall at home when leg gave out sustaining L displaced intertrochanteric hip fx. S/p L hip cephalomedullary nail and closed management of superior pubic ramus fx 2/23. On 3/3, pt with worsening respiratory status and encephalopathy, transferred to ICU and started on BiPAP. PMH includes afib, CAD, CHF, HTN, pacemaker, asthma, DM2, stroke, OSA, cervical sx, back sx, R ankle fx. OP MBS 12/05/20 due to reports of pill dysphagia, occasional solid food dysphagia. MBS revealed mild pharyngeal dysphagia with trace penetration of thin liquids to vocal folds x1; chin tuck prevented penetration per report. Esophagram same day found stricture of distal esophagus with recs for endoscopy. No indication that there was a f/u EGD. ?  ?   ?SLP Plan ? All goals met ? ?  ?  ?Recommendations for follow up therapy are one component of a multi-disciplinary discharge planning process, led by the attending physician.  Recommendations may be updated based on patient status,  additional functional criteria and insurance authorization. ?  ? ?Recommendations  ?Diet recommendations: Regular;Thin liquid ?Liquids provided via: Cup;Straw ?Medication Administration: Crushed with puree ?Supervision: Staff to assist with self feeding  ?   ?    ?   ? ? ? ? Oral Care Recommendations: Oral care BID ?Follow Up Recommendations: No SLP follow up ?SLP Visit Diagnosis: Dysphagia, pharyngoesophageal phase (R13.14) ?Plan: All goals met ? ? ? ? ?  ?  ?Mabel Unrein L. Israel Werts, MA CCC/SLP ?Acute Rehabilitation Services ?Office number [phone] ?Pager [phone] ? ? ?Juan Quam Laurice ? ?04/16/2021, 10:40 AM ?

## 2021-04-16 NOTE — Progress Notes (Signed)
[facility] ROUNDING NOTE   Subjective:   Interval History: 76 year old lady with stage IIIb chronic kidney disease.  Admitted 04/03/2021 with  fall and hip fracture.  Low blood pressures postoperatively suspecting acute tubular necrosis.  Dialysis initiated 04/11/2021 with transition to CRRT 04/12/2021 with BiPAP dependent respiratory failure.  Previous history includes a history of CAD status post CABG. CRRT discontinued on 04/14/2021.  Looking to maybe transition to intermittent hemodialysis  Blood pressure 114/51 pulse 84 temperature 98 O2 sats 94% oxygen 94% BiPAP 50%  Sodium 132 potassium 4.5 chloride 98 CO2 23 BUN 75 creatinine 3.5 glucose 109 calcium 9.2 phosphorus 2.8  Objective:  Vital signs in last 24 hours:  Temp:  [97.3 F (36.3 C)-98.4 F (36.9 C)] 98 F (36.7 C) (03/07 0400) Pulse Rate:  [71-114] 79 (03/07 0600) Resp:  [6-31] 8 (03/07 0600) BP: (97-153)/(55-100) 144/59 (03/07 0400) SpO2:  [85 %-100 %] 95 % (03/07 0600) Arterial Line BP: (89-166)/(45-70) 110/48 (03/07 0600) FiO2 (%):  [40 %-50 %] 50 % (03/07 0320) Weight:  [98.2 kg] 98.2 kg (03/07 0500)  Weight change: 1.5 kg Filed Weights   04/14/21 0500 04/15/21 0321 04/16/21 0500  Weight: 100.3 kg 96.7 kg 98.2 kg    Intake/Output: I/O last 3 completed shifts: In: 50 [P.O.:970] Out: 10 [Urine:10]   Intake/Output this shift:  No intake/output data recorded.  Gen obese, somnolent, deconditioned, on BiPAP RRR no RG Abd soft obese ntnd no mass or ascites +bs MS no joint effusions or deformity Ext trace LE/ UE edema IJ [facility] noted   Basic Metabolic Panel: Recent Labs  Lab 04/13/21 0949 04/13/21 1134 04/13/21 1734 04/14/21 0317 04/14/21 0318 04/14/21 1225 04/14/21 1612 04/14/21 1833 04/15/21 0315 04/15/21 0320 04/15/21 1726 04/16/21 0352  NA 138   136   < > 138  --  137   < > 135 135 135  --  134* 132*  K 5.3*   5.2*   < > 4.9  --  4.6   < > 4.5 4.3 4.4  --  4.4 4.5  CL 103   101  --   100  --  101  --  100  --  101  --   --  98  CO2 18*   18*  --  24  --  23  --  24  --  23  --   --  23  GLUCOSE 122*   121*  --  126*  --  114*  --  159*  --  130*  --   --  109*  BUN 55*   56*  --  42*  --  35*  --  29*  --  41*  --   --  75*  CREATININE 3.89*   3.89*  --  2.84*  --  2.43*  --  1.87*  --  2.42*  --   --  3.58*  CALCIUM 8.7*   8.6*  --  8.9  --  9.1  --  9.4  --  9.7  --   --  9.2  MG 2.5*  --   --  2.7*  --   --   --   --   --  2.7*  --  2.8*  PHOS 5.0*  --  3.9  --  2.8  --  2.3*  --  2.5  --   --  3.3   < > = values in this interval not displayed.  Liver Function Tests: Recent Labs  Lab 04/13/21 1734 04/14/21 0318 04/14/21 1612 04/15/21 0315 04/16/21 0352  ALBUMIN 1.9* 1.8* 2.1* 2.0* 1.9*    No results for input(s): LIPASE, AMYLASE in the last 168 hours. No results for input(s): AMMONIA in the last 168 hours.  CBC: Recent Labs  Lab 04/10/21 0523 04/11/21 0850 04/12/21 0233 04/12/21 1454 04/13/21 0949 04/13/21 1134 04/13/21 1627 04/14/21 0317 04/14/21 1225 04/14/21 1833 04/15/21 1726  WBC 7.9 9.0 12.3*  --  15.3*  --   --  15.5*  --   --   --   NEUTROABS 6.6 7.7 10.8*  --  13.7*  --   --  13.9*  --   --   --   HGB 9.9* 10.5* 9.0*   < > 8.9*   < > 9.2* 8.5* 9.9* 9.9* 8.8*  HCT 31.3* 32.8* 28.6*   < > 29.5*   < > 27.0* 28.2* 29.0* 29.0* 26.0*  MCV 94.0 94.0 93.5  --  95.5  --   --  95.6  --   --   --   PLT 176 204 207  --  208  --   --  224  --   --   --    < > = values in this interval not displayed.     Cardiac Enzymes: No results for input(s): CKTOTAL, CKMB, CKMBINDEX, TROPONINI in the last 168 hours.  BNP: Invalid input(s): POCBNP  CBG: Recent Labs  Lab 04/15/21 1131 04/15/21 1546 04/15/21 1914 04/15/21 2321 04/16/21 0347  GLUCAP 144* 163* 126* 105* 80     Microbiology: Results for orders placed or performed during the hospital encounter of 04/03/21  Resp Panel by RT-PCR (Flu A&B, Covid) Nasopharyngeal Swab     Status:  None   Collection Time: 04/03/21  9:40 AM   Specimen: Nasopharyngeal Swab; Nasopharyngeal(NP) swabs in vial transport medium  Result Value Ref Range Status   SARS Coronavirus 2 by RT PCR NEGATIVE NEGATIVE Final    Comment: (NOTE) SARS-CoV-2 target nucleic acids are NOT DETECTED.  The SARS-CoV-2 RNA is generally detectable in upper respiratory specimens during the acute phase of infection. The lowest concentration of SARS-CoV-2 viral copies this assay can detect is 138 copies/mL. A negative result does not preclude SARS-Cov-2 infection and should not be used as the sole basis for treatment or other patient management decisions. A negative result may occur with  improper specimen collection/handling, submission of specimen other than nasopharyngeal swab, presence of viral mutation(s) within the areas targeted by this assay, and inadequate number of viral copies(<138 copies/mL). A negative result must be combined with clinical observations, patient history, and epidemiological information. The expected result is Negative.  Fact Sheet for Patients:  EntrepreneurPulse.com.au  Fact Sheet for Healthcare Providers:  IncredibleEmployment.be  This test is no t yet approved or cleared by the [place] FDA and  has been authorized for detection and/or diagnosis of SARS-CoV-2 by FDA under an Emergency Use Authorization (EUA). This EUA will remain  in effect (meaning this test can be used) for the duration of the COVID-19 declaration under Section 564(b)(1) of the Act, 21 U.S.C.section 360bbb-3(b)(1), unless the authorization is terminated  or revoked sooner.       Influenza A by PCR NEGATIVE NEGATIVE Final   Influenza B by PCR NEGATIVE NEGATIVE Final    Comment: (NOTE) The Xpert Xpress SARS-CoV-2/FLU/RSV plus assay is intended as an aid in the diagnosis of influenza from Nasopharyngeal swab specimens and should not  be used as a sole basis for  treatment. Nasal washings and aspirates are unacceptable for Xpert Xpress SARS-CoV-2/FLU/RSV testing.  Fact Sheet for Patients: EntrepreneurPulse.com.au  Fact Sheet for Healthcare Providers: IncredibleEmployment.be  This test is not yet approved or cleared by the [place] FDA and has been authorized for detection and/or diagnosis of SARS-CoV-2 by FDA under an Emergency Use Authorization (EUA). This EUA will remain in effect (meaning this test can be used) for the duration of the COVID-19 declaration under Section 564(b)(1) of the Act, 21 U.S.C. section 360bbb-3(b)(1), unless the authorization is terminated or revoked.  Performed at [facility], [place] [address] [place], [place] [address]   Surgical PCR Screen     Status: None   Collection Time: 04/04/21  3:45 PM   Specimen: Nasal Mucosa; Nasal Swab  Result Value Ref Range Status   MRSA, PCR NEGATIVE NEGATIVE Final   Staphylococcus aureus NEGATIVE NEGATIVE Final    Comment: (NOTE) The Xpert SA Assay (FDA approved for NASAL specimens in patients 20 years of age and older), is one component of a comprehensive surveillance program. It is not intended to diagnose infection nor to guide or monitor treatment. Performed at [facility], [place] [address] [place], [place] [address]    *Note: Due to a large number of results and/or encounters for the requested time period, some results have not been displayed. A complete set of results can be found in Results Review.    Coagulation Studies: No results for input(s): LABPROT, INR in the last 72 hours.  Urinalysis: No results for input(s): COLORURINE, LABSPEC, PHURINE, GLUCOSEU, HGBUR, BILIRUBINUR, KETONESUR, PROTEINUR, UROBILINOGEN, NITRITE, LEUKOCYTESUR in the last 72 hours.  Invalid input(s): APPERANCEUR    Imaging: No results found.   Medications:    sodium chloride      allopurinol  100 mg Oral Daily   aspirin EC  81  mg Oral Daily   chlorhexidine  15 mL Mouth Rinse BID   Chlorhexidine Gluconate Cloth  6 each Topical Daily   clopidogrel  75 mg Oral Daily   cycloSPORINE  1 drop Both Eyes BID   heparin injection (subcutaneous)  5,000 Units Subcutaneous Q8H   mouth rinse  15 mL Mouth Rinse q12n4p   midodrine  10 mg Oral TID WC   mometasone-formoterol  2 puff Inhalation BID   pantoprazole  40 mg Oral BID   polyethylene glycol  17 g Oral Daily   polyvinyl alcohol  1 drop Both Eyes Q12H   predniSONE  10 mg Oral Q breakfast   rosuvastatin  5 mg Oral QPM   senna  1 tablet Oral BID   acetaminophen, bisacodyl, fentaNYL (SUBLIMAZE) injection, guaiFENesin, hydrALAZINE, HYDROcodone-acetaminophen, ondansetron (ZOFRAN) IV  Assessment/ Plan:  Acute on chronic kidney disease.  Baseline creatinine about 1.3 mg/dL.  Status post hip fracture now with hypotension and respiratory distress.  Patient placed on CRRT 04/12/2021 This was discontinued on 04/14/2021.  Appears to have poor urine output.  Hemodialysis requested although historically did not tolerate.  She continues a full CODE STATUS.  We can attempt another dialysis treatment 04/16/2021. ANEMIA-no ESA's at this point. MBD-appears to be stable at this point. HTN/VOL-  She has little urine output.  She may need transition to intermittent dialysis. Status post hip fracture at 04/03/2021    LOS: [place] '@TODAY''@7'$ :42 AM

## 2021-04-16 NOTE — Progress Notes (Addendum)
? ?NAME:  [name], MRN:  [id number], DOB:  1945/09/03, LOS: 45 ?ADMISSION DATE:  04/03/2021, CONSULTATION DATE: 04/12/2021 ?REFERRING MD: Hospitalist, CHIEF COMPLAINT: Worsening respiratory distress ? ?History of Present Illness:  ?76 year old obese female with known OSA OHS O2 dependent at 3 L nasal cannula who fell and suffered a fracture left femur.  She developed worsening renal failure while in the hospital required a HD catheter that was tunneled and placed on 04/11/2021 per interventional radiology.  On 04/12/2021 she had worsening respiratory failure she was obtunded and was transferred to intensive care unit for further evaluation and treatment by pulmonary critical care.  She will most likely need intubation full mechanical ventilatory support and CRRT.  She is noted to be hypotensive is on midodrine may require vasopressor support ? ?Pertinent  Medical History  ? ?Past Medical History:  ?Diagnosis Date  ? ABDOMINAL PAIN -GENERALIZED 02/21/2010  ? Qualifier: Diagnosis of  By: Chester Holstein NP, Nevin Bloodgood    ? AICD (automatic cardioverter/defibrillator) present   ? downgraded to CRT-P in 2014  ? Anemia   ? Arthritis   ? Asthma   ? Atrial fibrillation ([place]) 10/24/2015  ? Questionable history of A Fib/A Flutter. On device interrogation on 9/7, showed 0.23mn of A Fib/A Flutter with 1% burden.  Device check in Jan 2018 showed no sustained AF (runs of less than 30 seconds). No anticoagulation indicated.  ? CAD (coronary artery disease)   ? a. 10/09/16 LHC: SVG->LAD patent, SVG->Diag patent, SVG->RCA patent, SVG->LCx occluded. EF 60%, b. 10/31/16 LHC DES to AV groove Circ, DES to intermed branch  ? Cellulitis and abscess of foot 12/2014  ? RT FOOT  ? CHF (congestive heart failure) ([place]   ? Chronic bronchitis ([place]   ? Chronic lower back pain   ? Complete heart block ([place] 06/22/2015  ? Diverticulosis   ? Facial numbness 02/2016  ? Fatty liver disease, nonalcoholic   ? Gastritis   ? GERD (gastroesophageal reflux disease)    ? Gout   ? H/O hiatal hernia   ? HEMORRHOIDS-EXTERNAL 02/21/2010  ? Qualifier: Diagnosis of  By: GChester HolsteinNP, PNevin Bloodgood   ? High cholesterol   ? Hyperplastic colon polyp   ? Hypertension   ? IBS (irritable bowel syndrome)   ? Internal hemorrhoids   ? INTERNAL HEMORRHOIDS WITHOUT MENTION COMP 04/12/2007  ? Qualifier: Diagnosis of  By: BOlevia PerchesMD, DLowella Bandy  ? Nonischemic cardiomyopathy ([place] 11/22/2011  ? Pt responded to BiV ICD- last EF 55-60% Sept 2017  ? Obesity   ? On home oxygen therapy   ? "2L at night" (10/31/2016)  ? OSA (obstructive sleep apnea)   ? "can't tolerate a mask" (10/30/2016)  ? PERSONAL HX COLONIC POLYPS 02/21/2010  ? Qualifier: Diagnosis of  By: GChester HolsteinNP, PNevin Bloodgood   ? Pneumonia   ? "couple times" (10/31/2016)  ? Presence of permanent cardiac pacemaker   ? 11/02/12 [employer] V273 INTUA PPM  ? Right facial numbness 03/05/2016  ? Shortness of breath   ? Stroke ([facility] 09/2020  ? TIA (transient ischemic attack)   ? "recently" (10/31/2016)  ? Type II diabetes mellitus ([place]   ? ? ? ?Significant Hospital Events: ?Including procedures, antibiotic start and stop dates in addition to other pertinent events   ?04/12/2021 transferred intensive care unit; CRRT started ?3/4: improved mentation  ?3/5: CRRT stopped ?3/6: no UOP, increasing creatinine ? ?Interim History / Subjective:  ? ?No significant overnight events.  ?She says she feels  a little worse today, no particular complaint.  ? ?No UOP since CRRT was discontinued. ?WT up ~3lb since yesterday. ?Remains on intermittent BiPAP. ? ?Morning labs reviewed:  ?Na 134>132 ?Creatinine 1.87>2.42>3.58; BUN 29>41>75  ?Mag 2.8 ? ?Objective   ?Blood pressure (!) 144/59, pulse 79, temperature 98 ?F (36.7 ?C), temperature source Axillary, resp. rate (!) 8, height '5\' 3"'$  (1.6 m), weight 98.2 kg, SpO2 95 %. ?   ?Vent Mode: BIPAP ?FiO2 (%):  [40 %-50 %] 50 % ?Set Rate:  [14 bmp] 14 bmp ?PEEP:  [6 cmH20] 6 cmH20  ? ?Intake/Output Summary (Last 24 hours) at 04/16/2021 0704 ?Last  data filed at 04/16/2021 0600 ?Gross per 24 hour  ?Intake 940 ml  ?Output 10 ml  ?Net 930 ml  ? ? ?Filed Weights  ? 04/14/21 0500 04/15/21 0321 04/16/21 0500  ?Weight: 100.3 kg 96.7 kg 98.2 kg  ? ? ?Examination: ?General: chronically ill appearing in no distress ?Cardiac: regular rhythm, regular rate, no LE edema ?Pulm: breathing comfortably on BiPAP. Lung sounds diminished throughout ?GI: abd soft, non-tender ?Skin: dressing over bridge of nose clean and intact ?Neuro: awake and alert. Answers questions appropriately and follows commands.  ? ?Resolved Hospital Problem list   ? ? ?Assessment & Plan:  ? ?Acute on chronic hypoxic and hypercapnic respiratory failure requiring intermittent NIPPV  ?Due to OSA/OHS and volume overload  ?She has been tolerating cycling between BiPAP and HHF.  ?Will need home BiPAP set up prior to discharge ? ?Acute metabolic encephalopathy secondary to hypercapnia and uremia ?Improved since arrival to ICU ?Continue to monitor. Delirium precautions  ? ?Acute on chronic renal failure with anuria ?Did not tolerate iHD due to hypotension so required CRRT 3/3-3/5.  ?Unfortunately, she has not had any urine output since CRRT was discontinued and BUN/creatinine continue to increase again. No major electrolyte derangements this morning.  ?Primary management per nephrology ?Plan for iHD trial today. If she tolerates this ok today, can likely transfer her out of ICU. If she doesn't, will need CRRT again. ?Can discontinue foley catheter. ? ?Hypervolemic hypernatremia. Continue to monitor.  ? ?Post ORIF repair per orthopedics ?Per orthopedic ? ?Hypotension. resolved. Likely due to cor pulmonale in the setting of severe volume overload.  ?Continue midodrine in anticipation of HD ? ?Adrenal insufficiency ?Continue home dose of prednisone '10mg'$  ? ?Chronic anemia. Likely due to renal disease. Continue to monitor.  ? ?LLE swelling.  ?3/4 DVT study negative.  ? ?History of hypertension.  ?Continue holding home  antihypertensives. ? ?Type 2 DM ?CBGs at goal. Add SSI as needed ? ?Chronic diastolic heart failure.  ?GDMT on hold due to hypotension. Resume as able ? ?History of coronary disease status post coronary bypass grafting 2016 AICD is in place ?Complete heart block s/p CRT-P.  ?Continue aspirin and plavix ? ?Best Practice (right click and "Reselect all SmartList Selections" daily)  ? ?Diet/type: Regular consistency (see orders) ?DVT prophylaxis: other ?GI prophylaxis: PPI ?Lines: Dialysis Catheter ?Foley:  Yes, and it is still needed ?Code Status:  full code ?Last date of multidisciplinary goals of care discussion: son updated 3/6 ? ?Mitzi Hansen, MD ?Internal Medicine Resident PGY-3 ?[facility] ?Pager: [phone] ?04/16/2021 7:04 AM  ?  ? ? ? ?

## 2021-04-17 ENCOUNTER — Inpatient Hospital Stay (HOSPITAL_COMMUNITY): Payer: Medicare Other

## 2021-04-17 ENCOUNTER — Other Ambulatory Visit: Payer: Self-pay | Admitting: *Deleted

## 2021-04-17 DIAGNOSIS — D649 Anemia, unspecified: Secondary | ICD-10-CM | POA: Diagnosis not present

## 2021-04-17 DIAGNOSIS — N179 Acute kidney failure, unspecified: Secondary | ICD-10-CM | POA: Diagnosis not present

## 2021-04-17 DIAGNOSIS — I5032 Chronic diastolic (congestive) heart failure: Secondary | ICD-10-CM | POA: Diagnosis not present

## 2021-04-17 DIAGNOSIS — J9621 Acute and chronic respiratory failure with hypoxia: Secondary | ICD-10-CM | POA: Diagnosis not present

## 2021-04-17 LAB — RENAL FUNCTION PANEL
Albumin: 2 g/dL — ABNORMAL LOW (ref 3.5–5.0)
Anion gap: 9 (ref 5–15)
BUN: 43 mg/dL — ABNORMAL HIGH (ref 8–23)
CO2: 26 mmol/L (ref 22–32)
Calcium: 8.9 mg/dL (ref 8.9–10.3)
Chloride: 98 mmol/L (ref 98–111)
Creatinine, Ser: 2.57 mg/dL — ABNORMAL HIGH (ref 0.44–1.00)
GFR, Estimated: 19 mL/min — ABNORMAL LOW (ref >60)
Glucose, Bld: 127 mg/dL — ABNORMAL HIGH (ref 70–99)
Phosphorus: 2.1 mg/dL — ABNORMAL LOW (ref 2.5–4.6)
Potassium: 3.5 mmol/L (ref 3.5–5.1)
Sodium: 133 mmol/L — ABNORMAL LOW (ref 135–145)

## 2021-04-17 LAB — GLUCOSE, CAPILLARY
Glucose-Capillary: 107 mg/dL — ABNORMAL HIGH (ref 70–99)
Glucose-Capillary: 121 mg/dL — ABNORMAL HIGH (ref 70–99)
Glucose-Capillary: 122 mg/dL — ABNORMAL HIGH (ref 70–99)
Glucose-Capillary: 129 mg/dL — ABNORMAL HIGH (ref 70–99)
Glucose-Capillary: 129 mg/dL — ABNORMAL HIGH (ref 70–99)
Glucose-Capillary: 154 mg/dL — ABNORMAL HIGH (ref 70–99)
Glucose-Capillary: 197 mg/dL — ABNORMAL HIGH (ref 70–99)

## 2021-04-17 LAB — APTT: aPTT: 28 seconds (ref 24–36)

## 2021-04-17 LAB — MAGNESIUM: Magnesium: 2.2 mg/dL (ref 1.7–2.4)

## 2021-04-17 MED ORDER — FENTANYL CITRATE (PF) 100 MCG/2ML IJ SOLN: 50.0000 ug | INTRAMUSCULAR | Status: DC | PRN

## 2021-04-17 MED ORDER — INSULIN ASPART 100 UNIT/ML IJ SOLN
0.0000 [IU] | Freq: Three times a day (TID) | INTRAMUSCULAR | Status: DC
  Administered 2021-04-18: 20:00:00 1 [IU] via SUBCUTANEOUS
  Administered 2021-04-18: 13:00:00 2 [IU] via SUBCUTANEOUS
  Administered 2021-04-19 – 2021-04-21 (×3): 1 [IU] via SUBCUTANEOUS
  Administered 2021-04-24: 2 [IU] via SUBCUTANEOUS
  Administered 2021-04-26: 1 [IU] via SUBCUTANEOUS
  Administered 2021-04-27: 0 [IU] via SUBCUTANEOUS
  Administered 2021-04-27: 1 [IU] via SUBCUTANEOUS

## 2021-04-17 MED ORDER — PANTOPRAZOLE 2 MG/ML SUSPENSION
40.0000 mg | Freq: Two times a day (BID) | ORAL | Status: DC
  Administered 2021-04-17 – 2021-04-25 (×15): 40 mg via ORAL
  Filled 2021-04-17 (×19): qty 20

## 2021-04-17 MED ORDER — MIDODRINE HCL 5 MG PO TABS
10.0000 mg | ORAL_TABLET | ORAL | Status: DC
  Administered 2021-04-20 – 2021-04-27 (×12): 10 mg via ORAL
  Filled 2021-04-17 (×12): qty 2

## 2021-04-17 MED ORDER — MELATONIN 3 MG PO TABS
3.0000 mg | ORAL_TABLET | Freq: Every evening | ORAL | Status: DC | PRN
  Administered 2021-04-17 – 2021-04-27 (×5): 3 mg via ORAL
  Filled 2021-04-17 (×5): qty 1

## 2021-04-17 MED ORDER — ASPIRIN 81 MG PO CHEW
81.0000 mg | CHEWABLE_TABLET | Freq: Every day | ORAL | Status: DC
  Administered 2021-04-17 – 2021-04-28 (×12): 81 mg via ORAL
  Filled 2021-04-17 (×12): qty 1

## 2021-04-17 MED ORDER — PANTOPRAZOLE 2 MG/ML SUSPENSION
40.0000 mg | Freq: Two times a day (BID) | ORAL | Status: DC
  Filled 2021-04-17: qty 20

## 2021-04-17 MED ORDER — ALBUTEROL SULFATE (2.5 MG/3ML) 0.083% IN NEBU
2.5000 mg | INHALATION_SOLUTION | RESPIRATORY_TRACT | Status: DC | PRN
  Administered 2021-04-17 – 2021-04-18 (×4): 2.5 mg via RESPIRATORY_TRACT
  Filled 2021-04-17 (×4): qty 3

## 2021-04-17 MED ORDER — CHLORHEXIDINE GLUCONATE CLOTH 2 % EX PADS
6.0000 | MEDICATED_PAD | Freq: Every day | CUTANEOUS | Status: DC
  Administered 2021-04-17 – 2021-04-21 (×4): 6 via TOPICAL

## 2021-04-17 MED ORDER — PANTOPRAZOLE SODIUM 40 MG PO TBEC
40.0000 mg | DELAYED_RELEASE_TABLET | Freq: Two times a day (BID) | ORAL | Status: DC
Start: 2021-04-17 — End: 2021-04-17

## 2021-04-17 MED ORDER — SALINE SPRAY 0.65 % NA SOLN
1.0000 | NASAL | Status: DC | PRN
  Administered 2021-04-17: 1 via NASAL
  Filled 2021-04-17: qty 44

## 2021-04-17 MED ORDER — MIDODRINE HCL 5 MG PO TABS
10.0000 mg | ORAL_TABLET | Freq: Three times a day (TID) | ORAL | Status: DC
  Filled 2021-04-17: qty 2

## 2021-04-17 NOTE — Progress Notes (Signed)
PT Cancellation Note ? ?Patient Details ?Name: ADELAI ACHEY ?MRN: [id number] ?DOB: July 16, 1945 ? ? ?Cancelled Treatment:    Reason Eval/Treat Not Completed: (P) Medical issues which prohibited therapy Pt reports feeling out of breath and SpO2 in high 80%s O2. Pt request PT follow back this afternoon for therapy.  ? ?Marty Uy B. Migdalia Dk PT, DPT ?Acute Rehabilitation Services ?Pager [phone] ?Office [phone] ? ? ? ?[place] ?04/17/2021, 10:38 AM ? ? ?

## 2021-04-17 NOTE — Progress Notes (Signed)
eLink Physician-Brief Progress Note ?Patient Name: [name] ?DOB: [birth year] ?MRN: [id number] ? ? ?Date of Service ? 04/17/2021  ?HPI/Events of Note ? Patient request for sleep aid.   ?eICU Interventions ? Plan: ?Melatonin 3 mg PO Q HS PRN sleep.   ? ? ? ?Intervention Category ?Major Interventions: Other: ? ?Damean Poffenberger Cornelia Copa ?04/17/2021, 12:41 AM ?

## 2021-04-17 NOTE — Progress Notes (Signed)
? ?NAME:  [name], MRN:  [id number], DOB:  [birth year], LOS: 38 ?ADMISSION DATE:  04/03/2021, CONSULTATION DATE: 04/12/2021 ?REFERRING MD: Hospitalist, CHIEF COMPLAINT: Worsening respiratory distress ? ?History of Present Illness:  ?76 year old obese female with known OSA OHS O2 dependent at 3 L nasal cannula who fell and suffered a fracture left femur.  She developed worsening renal failure while in the hospital required a HD catheter that was tunneled and placed on 04/11/2021 per interventional radiology.  On 04/12/2021 she had worsening respiratory failure she was obtunded and was transferred to intensive care unit for further evaluation and treatment by pulmonary critical care.  She will most likely need intubation full mechanical ventilatory support and CRRT.  She is noted to be hypotensive is on midodrine may require vasopressor support ? ?Pertinent  Medical History  ? ?Past Medical History:  ?Diagnosis Date  ? ABDOMINAL PAIN -GENERALIZED 02/21/2010  ? Qualifier: Diagnosis of  By: Chester Holstein NP, Nevin Bloodgood    ? AICD (automatic cardioverter/defibrillator) present   ? downgraded to CRT-P in 2014  ? Anemia   ? Arthritis   ? Asthma   ? Atrial fibrillation ([place]) 10/24/2015  ? Questionable history of A Fib/A Flutter. On device interrogation on 9/7, showed 0.100mn of A Fib/A Flutter with 1% burden.  Device check in Jan 2018 showed no sustained AF (runs of less than 30 seconds). No anticoagulation indicated.  ? CAD (coronary artery disease)   ? a. 10/09/16 LHC: SVG->LAD patent, SVG->Diag patent, SVG->RCA patent, SVG->LCx occluded. EF 60%, b. 10/31/16 LHC DES to AV groove Circ, DES to intermed branch  ? Cellulitis and abscess of foot 12/2014  ? RT FOOT  ? CHF (congestive heart failure) ([place]   ? Chronic bronchitis ([place]   ? Chronic lower back pain   ? Complete heart block ([place] 06/22/2015  ? Diverticulosis   ? Facial numbness 02/2016  ? Fatty liver disease, nonalcoholic   ? Gastritis   ? GERD (gastroesophageal reflux disease)    ? Gout   ? H/O hiatal hernia   ? HEMORRHOIDS-EXTERNAL 02/21/2010  ? Qualifier: Diagnosis of  By: GChester HolsteinNP, PNevin Bloodgood   ? High cholesterol   ? Hyperplastic colon polyp   ? Hypertension   ? IBS (irritable bowel syndrome)   ? Internal hemorrhoids   ? INTERNAL HEMORRHOIDS WITHOUT MENTION COMP 04/12/2007  ? Qualifier: Diagnosis of  By: BOlevia PerchesMD, DLowella Bandy  ? Nonischemic cardiomyopathy ([place] 11/22/2011  ? Pt responded to BiV ICD- last EF 55-60% Sept 2017  ? Obesity   ? On home oxygen therapy   ? "2L at night" (10/31/2016)  ? OSA (obstructive sleep apnea)   ? "can't tolerate a mask" (10/30/2016)  ? PERSONAL HX COLONIC POLYPS 02/21/2010  ? Qualifier: Diagnosis of  By: GChester HolsteinNP, PNevin Bloodgood   ? Pneumonia   ? "couple times" (10/31/2016)  ? Presence of permanent cardiac pacemaker   ? 11/02/12 [employer] V273 INTUA PPM  ? Right facial numbness 03/05/2016  ? Shortness of breath   ? Stroke ([facility] 09/2020  ? TIA (transient ischemic attack)   ? "recently" (10/31/2016)  ? Type II diabetes mellitus ([place]   ? ? ? ?Significant Hospital Events: ?Including procedures, antibiotic start and stop dates in addition to other pertinent events   ?04/12/2021 transferred intensive care unit; CRRT started ?3/4: improved mentation  ?3/5: CRRT stopped ?3/6: no UOP, increasing creatinine ?3/7: tolerated iHD ?3/8: d/c a-line. Transfer to progressive. ? ?Interim History / Subjective:  ? ?  Tolerated iHD yesterday. Net UF 2L. No hypotension. ? ?Reports feeling better this morning. She is agreeable to doing iHD long term if she has to. ? ?Morning labs reviewed. ? ?Objective   ?Blood pressure (!) 138/54, pulse (!) 101, temperature 97.7 ?F (36.5 ?C), temperature source Oral, resp. rate (!) 23, height '5\' 3"'$  (1.6 m), weight 97.3 kg, SpO2 94 %. ?   ?FiO2 (%):  [30 %-50 %] 40 %  ? ?Intake/Output Summary (Last 24 hours) at 04/17/2021 0738 ?Last data filed at 04/16/2021 2238 ?Gross per 24 hour  ?Intake 1060 ml  ?Output 2000 ml  ?Net -940 ml  ? ? ?Filed Weights  ?  04/16/21 0500 04/16/21 1922 04/17/21 0500  ?Weight: 98.2 kg 98.2 kg 97.3 kg  ? ? ?Examination: ?General: chronically ill appearing female in no distress ?Cardiac: RRR, no LE edema ?Pulm: breathing comfortably on 20L HHF 40%FiO2 with O2 saturations 96%. Lung sounds clear ?GI: abd soft, non-tender ?Skin: tunneled HD line in the left upper chest without surrounding erythema/edema.  ? ?Resolved Hospital Problem list   ?Acute metabolic encephalopathy due to hypercapnia and uremia  ? ?Assessment & Plan:  ? ?Acute on chronic hypoxic and hypercapnic respiratory failure due to volume overload ?Suspect this will continue to improve with volume removal from HD ?On 3L  ?OSA/OHS ?Wean HHF for O2 saturations 88-92% ?BiPAP qHS ?Will need home BiPAP set up prior to discharge ? ?Acute on chronic renal failure with anuria ?Did not tolerate iHD due to hypotension so required CRRT 3/3-3/5.  ?Tolerated iHD 3/7 with net UF 2L ?Primary management per nephrology ?Next HD planned 3/9 ?Continue midodrine ? ?Post ORIF repair per orthopedics ?Per orthopedic ? ?Adrenal insufficiency ?Continue home dose of prednisone '10mg'$  ? ?Chronic anemia. Likely due to renal disease. Continue to monitor.  ? ?LLE swelling.  ?3/4 DVT study negative.  ? ?History of hypertension.  ?Continue holding home antihypertensives. ? ?Type 2 DM ?CBGs at goal. Add SSI as needed ? ?Chronic diastolic heart failure.  ?GDMT on hold due to hypotension. Resume as able ? ?History of coronary disease status post coronary bypass grafting 2016 AICD is in place ?Complete heart block s/p CRT-P.  ?Continue aspirin and plavix ? ?Medically stable for transfer to progressive. TRH to assume care 3/9.  ? ?Best Practice (right click and "Reselect all SmartList Selections" daily)  ? ?Diet/type: Regular consistency (see orders) ?DVT prophylaxis: prophylactic heparin  ?GI prophylaxis: PPI ?Lines: Dialysis Catheter ?Foley:  N/A ?Code Status:  full code ?Last date of multidisciplinary goals of care  discussion: son updated 3/7 ? ?Mitzi Hansen, MD ?Internal Medicine Resident PGY-3 ?[facility] ?Pager: [phone] ?04/17/2021 7:38 AM  ?  ? ? ? ?

## 2021-04-17 NOTE — Patient Outreach (Addendum)
[facility]) Care Management ? ?04/17/2021 ? ?[name] ?07-03-1945 ?[id number] ? ? ?[facility] Case closure  ? ?Patient remains with inpatient admission since 04/03/21 (10+ days) ?Chart review -pending anticipated facility placement ? ?Care Plan : RN Care Manager Plan of Care  ?Updates made by Barbaraann Faster, RN since 04/17/2021 12:00 AM  ?Completed 04/17/2021  ? ?Problem: Complex Care Coordination Needs and disease management in patient with CHF, Falls, Weight Resolved 04/17/2021  ?Priority: High  ?  ? ?Long-Range Goal: Establish Plan of Care for Management Complex SDOH Barriers, disease management and Care Coordination Needs in patient with CHF, falls, weight management Completed 04/17/2021  ?Recent Progress: On track  ?Priority: High  ?Note:   ?Current Barriers:  ?Knowledge Deficits related to plan of care for management of CHF  ?Care Coordination needs related to Limited education about CHF* ?Barriers: Knowledge deficits, health behaviors, Fall history, recurrent edema ?01/21/21 fall x 2 continues with edema, eyes better  ?01/30/21 loss female friend, voice interest in local senior socialization resources. Concern with holding diuretic r/t possible increase in edema ?02/05/21 reports she doing better and is getting out of the home with her children to become more active She reports her goals are to work on her weight and maintaining her fluids/edema/CHF in 2023 ?04/03/21 admission respiratory distress ?04/17/20 remains inpatient, pending anticipated skilled nursing placement, case closure ? ?RN CM Clinical Goal(s):  ?Patient will verbalize understanding of plan for management of CHF as evidenced by decrease admissions  through collaboration with RN Care manager, provider, and care team.  ?Goal not met re admission 04/03/21 ? ?Interventions: ?Outreaches as agreed for care coordination and disease management ?Inter-disciplinary care team collaboration (see longitudinal plan of care) ?Evaluation of current  treatment plan related to  self management and patient's adherence to plan as established by provider ?02/05/21 Review with patient her interest in how to get a personal scooter  ?Reviewed the general community level process with use of her pcp & HH PT to get orders and local DME company assist to get a one  ?She voiced understanding ?Reviewed [facility] progression- Pt will remain with [place] and medicaid coverage in 2023 and preference is for change in outreach frequency ? ?Heart Failure Interventions:  (Status:  Goal Not Met.) Long Term Goal ?Basic overview and discussion of pathophysiology of Heart Failure reviewed ?Provided education on low sodium diet ?Discussed importance of daily weight and advised patient to weigh and record daily ?Reviewed role of diuretics in prevention of fluid overload and management of heart failure; ?Discussed the importance of keeping all appointments with provider ?Screening for signs and symptoms of depression related to chronic disease state  ?Assessed social determinant of health barriers  ? ?Patient Goals/Self-Care Activities: ?Take all medications as prescribed ?Attend all scheduled provider appointments ?Attend church or other social activities ?Perform all self care activities independently  ?Perform IADL's (shopping, preparing meals, housekeeping, managing finances) independently ?Call provider office for new concerns or questions  ?call office if I gain more than 2 pounds in one day or 5 pounds in one week ?keep legs up while sitting ?use salt in moderation ?watch for swelling in feet, ankles and legs every day ? ?Follow Up Plan:   04/17/21 case closure unable to progress through care plan remains inpatient since 04/03/21 collaboration with [facility] hospital liaison/post acute care coordinator ? ? ?  ? ? ? ?Plan [facility] RN CM will close case patient unable to process through the [facility] care plan at this  time ?[facility] hospital liaison and post acute care coordinator updated for possible later  referral  ?Case closure letters sent to patient and MD ? ?Alicia Mcconnon L. Lavina Hamman, RN, BSN, CCM ?[place] Management Care Coordinator ?Office number [phone] ?[facility] number [id number] ?Fax number [phone] ? ?

## 2021-04-17 NOTE — Progress Notes (Signed)
PT Cancellation Note ? ?Patient Details ?Name: [name] ?MRN: [id number] ?DOB: 1945/06/03 ? ? ?Cancelled Treatment:    Reason Eval/Treat Not Completed: (P) Other (comment) (pt eating lunch) Pt will follow back later this afternoon as able. ? ?[name] B. [name] PT, DPT ?Acute Rehabilitation Services ?Pager [phone] ?Office [phone] ? ? ? ?[name] ?04/17/2021, 2:08 PM ? ? ?

## 2021-04-17 NOTE — Progress Notes (Signed)
[facility] ROUNDING NOTE   Subjective:   Interval History: 76 year old lady with stage IIIb chronic kidney disease.  Admitted 04/03/2021 with  fall and hip fracture.  Low blood pressures postoperatively suspecting acute tubular necrosis.  Dialysis initiated 04/11/2021 with transition to CRRT 04/12/2021 with BiPAP dependent respiratory failure.  Previous history includes a history of CAD status post CABG. CRRT discontinued on 04/14/2021.  Looking to maybe transition to intermittent hemodialysis.  Blood pressure 122/53 pulse 97 O2 sats 93% high flow nasal cannula  Sodium 133 potassium 3.5 chloride 98 CO2 26 glucose 127 BUN 43 creatinine 2.57 calcium 8.9 phosphorus 2.1 albumin 2.0.  Last hemoglobin 8.8  Objective:  Vital signs in last 24 hours:  Temp:  [96.9 F (36.1 C)-98.3 F (36.8 C)] 97.6 F (36.4 C) (03/07 2300) Pulse Rate:  [70-130] 101 (03/08 0600) Resp:  [6-28] 23 (03/08 0600) BP: (95-166)/(51-77) 138/54 (03/08 0400) SpO2:  [79 %-100 %] 94 % (03/08 0600) Arterial Line BP: (106-149)/(46-66) 135/58 (03/08 0600) FiO2 (%):  [30 %-50 %] 40 % (03/08 0300) Weight:  [97.3 kg-98.2 kg] 97.3 kg (03/08 0500)  Weight change: 0 kg Filed Weights   04/16/21 0500 04/16/21 1922 04/17/21 0500  Weight: 98.2 kg 98.2 kg 97.3 kg    Intake/Output: I/O last 3 completed shifts: In: 1300 [P.O.:1300] Out: 2010 [Urine:10; Other:2000]   Intake/Output this shift:  No intake/output data recorded.  Gen obese, somnolent, deconditioned, on BiPAP RRR no RG Abd soft obese ntnd no mass or ascites +bs MS no joint effusions or deformity Ext trace LE/ UE edema IJ [facility] noted   Basic Metabolic Panel: Recent Labs  Lab 04/13/21 0949 04/13/21 1134 04/14/21 0317 04/14/21 0318 04/14/21 1612 04/14/21 1833 04/15/21 0315 04/15/21 0320 04/15/21 1726 04/16/21 0352 04/16/21 1534 04/17/21 0414  NA 138   136   < >  --    < > 135   < > 135  --  134* 132* 130* 133*  K 5.3*   5.2*   < >  --    < >  4.5   < > 4.4  --  4.4 4.5 5.1 3.5  CL 103   101   < >  --    < > 100  --  101  --   --  98 97* 98  CO2 18*   18*   < >  --    < > 24  --  23  --   --  '23 23 26  '$ GLUCOSE 122*   121*   < >  --    < > 159*  --  130*  --   --  109* 193* 127*  BUN 55*   56*   < >  --    < > 29*  --  41*  --   --  75* 90* 43*  CREATININE 3.89*   3.89*   < >  --    < > 1.87*  --  2.42*  --   --  3.58* 4.14* 2.57*  CALCIUM 8.7*   8.6*   < >  --    < > 9.4  --  9.7  --   --  9.2 9.2 8.9  MG 2.5*  --  2.7*  --   --   --   --  2.7*  --  2.8*  --  2.2  PHOS 5.0*   < >  --    < > 2.3*  --  2.5  --   --  3.3 3.5 2.1*   < > = values in this interval not displayed.     Liver Function Tests: Recent Labs  Lab 04/14/21 1612 04/15/21 0315 04/16/21 0352 04/16/21 1534 04/17/21 0414  ALBUMIN 2.1* 2.0* 1.9* 1.8* 2.0*    No results for input(s): LIPASE, AMYLASE in the last 168 hours. No results for input(s): AMMONIA in the last 168 hours.  CBC: Recent Labs  Lab 04/11/21 0850 04/12/21 0233 04/12/21 1454 04/13/21 0949 04/13/21 1134 04/13/21 1627 04/14/21 0317 04/14/21 1225 04/14/21 1833 04/15/21 1726  WBC 9.0 12.3*  --  15.3*  --   --  15.5*  --   --   --   NEUTROABS 7.7 10.8*  --  13.7*  --   --  13.9*  --   --   --   HGB 10.5* 9.0*   < > 8.9*   < > 9.2* 8.5* 9.9* 9.9* 8.8*  HCT 32.8* 28.6*   < > 29.5*   < > 27.0* 28.2* 29.0* 29.0* 26.0*  MCV 94.0 93.5  --  95.5  --   --  95.6  --   --   --   PLT 204 207  --  208  --   --  224  --   --   --    < > = values in this interval not displayed.     Cardiac Enzymes: No results for input(s): CKTOTAL, CKMB, CKMBINDEX, TROPONINI in the last 168 hours.  BNP: Invalid input(s): POCBNP  CBG: Recent Labs  Lab 04/16/21 1138 04/16/21 1507 04/16/21 1917 04/16/21 2332 04/17/21 0323  GLUCAP 149* 180* 163* 138* 121*     Microbiology: Results for orders placed or performed during the hospital encounter of 04/03/21  Resp Panel by RT-PCR (Flu A&B, Covid)  Nasopharyngeal Swab     Status: None   Collection Time: 04/03/21  9:40 AM   Specimen: Nasopharyngeal Swab; Nasopharyngeal(NP) swabs in vial transport medium  Result Value Ref Range Status   SARS Coronavirus 2 by RT PCR NEGATIVE NEGATIVE Final    Comment: (NOTE) SARS-CoV-2 target nucleic acids are NOT DETECTED.  The SARS-CoV-2 RNA is generally detectable in upper respiratory specimens during the acute phase of infection. The lowest concentration of SARS-CoV-2 viral copies this assay can detect is 138 copies/mL. A negative result does not preclude SARS-Cov-2 infection and should not be used as the sole basis for treatment or other patient management decisions. A negative result may occur with  improper specimen collection/handling, submission of specimen other than nasopharyngeal swab, presence of viral mutation(s) within the areas targeted by this assay, and inadequate number of viral copies(<138 copies/mL). A negative result must be combined with clinical observations, patient history, and epidemiological information. The expected result is Negative.  Fact Sheet for Patients:  EntrepreneurPulse.com.au  Fact Sheet for Healthcare Providers:  IncredibleEmployment.be  This test is no t yet approved or cleared by the [place] FDA and  has been authorized for detection and/or diagnosis of SARS-CoV-2 by FDA under an Emergency Use Authorization (EUA). This EUA will remain  in effect (meaning this test can be used) for the duration of the COVID-19 declaration under Section 564(b)(1) of the Act, 21 U.S.C.section 360bbb-3(b)(1), unless the authorization is terminated  or revoked sooner.       Influenza A by PCR NEGATIVE NEGATIVE Final   Influenza B by PCR NEGATIVE NEGATIVE Final    Comment: (NOTE) The Xpert Xpress SARS-CoV-2/FLU/RSV plus assay is intended as an aid in the  diagnosis of influenza from Nasopharyngeal swab specimens and should not be  used as a sole basis for treatment. Nasal washings and aspirates are unacceptable for Xpert Xpress SARS-CoV-2/FLU/RSV testing.  Fact Sheet for Patients: EntrepreneurPulse.com.au  Fact Sheet for Healthcare Providers: IncredibleEmployment.be  This test is not yet approved or cleared by the [place] FDA and has been authorized for detection and/or diagnosis of SARS-CoV-2 by FDA under an Emergency Use Authorization (EUA). This EUA will remain in effect (meaning this test can be used) for the duration of the COVID-19 declaration under Section 564(b)(1) of the Act, 21 U.S.C. section 360bbb-3(b)(1), unless the authorization is terminated or revoked.  Performed at [facility], [place] [address] [place], [place] [address]   Surgical PCR Screen     Status: None   Collection Time: 04/04/21  3:45 PM   Specimen: Nasal Mucosa; Nasal Swab  Result Value Ref Range Status   MRSA, PCR NEGATIVE NEGATIVE Final   Staphylococcus aureus NEGATIVE NEGATIVE Final    Comment: (NOTE) The Xpert SA Assay (FDA approved for NASAL specimens in patients 14 years of age and older), is one component of a comprehensive surveillance program. It is not intended to diagnose infection nor to guide or monitor treatment. Performed at [facility], [place] [address] [place], [place] [address]    *Note: Due to a large number of results and/or encounters for the requested time period, some results have not been displayed. A complete set of results can be found in Results Review.    Coagulation Studies: No results for input(s): LABPROT, INR in the last 72 hours.  Urinalysis: No results for input(s): COLORURINE, LABSPEC, PHURINE, GLUCOSEU, HGBUR, BILIRUBINUR, KETONESUR, PROTEINUR, UROBILINOGEN, NITRITE, LEUKOCYTESUR in the last 72 hours.  Invalid input(s): APPERANCEUR    Imaging: No results found.   Medications:    sodium chloride      allopurinol  100 mg Oral  Daily   aspirin EC  81 mg Oral Daily   chlorhexidine  15 mL Mouth Rinse BID   Chlorhexidine Gluconate Cloth  6 each Topical Daily   Chlorhexidine Gluconate Cloth  6 each Topical Q0600   clopidogrel  75 mg Oral Daily   cycloSPORINE  1 drop Both Eyes BID   heparin injection (subcutaneous)  5,000 Units Subcutaneous Q8H   mouth rinse  15 mL Mouth Rinse q12n4p   midodrine  10 mg Oral TID WC   mometasone-formoterol  2 puff Inhalation BID   pantoprazole  40 mg Oral BID   polyethylene glycol  17 g Oral Daily   polyvinyl alcohol  1 drop Both Eyes Q12H   predniSONE  10 mg Oral Q breakfast   rosuvastatin  5 mg Oral QPM   senna  1 tablet Oral BID   acetaminophen, bisacodyl, fentaNYL (SUBLIMAZE) injection, guaiFENesin, hydrALAZINE, HYDROcodone-acetaminophen, melatonin, ondansetron (ZOFRAN) IV  Assessment/ Plan:  Acute on chronic kidney disease.  Baseline creatinine about 1.3 mg/dL.  Status post hip fracture now with hypotension and respiratory distress.  Patient placed on CRRT 04/12/2021 This was discontinued on 04/14/2021.  Appears to have poor urine output.  Hemodialysis requested although historically did not tolerate.  She continues a full CODE STATUS.  She appeared to tolerate dialysis 04/16/2021 with removal of 2 L.  We will attempt dialysis again on 04/18/2021 ANEMIA-no ESA's at this point. MBD-appears to be stable at this point. HTN/VOL-  She has little urine output.  She appeared to tolerate dialysis 04/16/2021 Status post hip fracture at 04/03/2021  LOS: [place] '@TODAY''@7'$ :14 AM

## 2021-04-18 DIAGNOSIS — J9601 Acute respiratory failure with hypoxia: Secondary | ICD-10-CM

## 2021-04-18 DIAGNOSIS — J9602 Acute respiratory failure with hypercapnia: Secondary | ICD-10-CM

## 2021-04-18 LAB — CBC WITH DIFFERENTIAL/PLATELET
Abs Immature Granulocytes: 0.37 10*3/uL — ABNORMAL HIGH (ref 0.00–0.07)
Basophils Absolute: 0 10*3/uL (ref 0.0–0.1)
Basophils Relative: 0 %
Eosinophils Absolute: 0 10*3/uL (ref 0.0–0.5)
Eosinophils Relative: 0 %
HCT: 27.1 % — ABNORMAL LOW (ref 36.0–46.0)
Hemoglobin: 8.7 g/dL — ABNORMAL LOW (ref 12.0–15.0)
Immature Granulocytes: 3 %
Lymphocytes Relative: 7 %
Lymphs Abs: 0.8 10*3/uL (ref 0.7–4.0)
MCH: 29.9 pg (ref 26.0–34.0)
MCHC: 32.1 g/dL (ref 30.0–36.0)
MCV: 93.1 fL (ref 80.0–100.0)
Monocytes Absolute: 0.6 10*3/uL (ref 0.1–1.0)
Monocytes Relative: 6 %
Neutro Abs: 8.9 10*3/uL — ABNORMAL HIGH (ref 1.7–7.7)
Neutrophils Relative %: 84 %
Platelets: 177 10*3/uL (ref 150–400)
RBC: 2.91 MIL/uL — ABNORMAL LOW (ref 3.87–5.11)
RDW: 16.1 % — ABNORMAL HIGH (ref 11.5–15.5)
WBC: 10.8 10*3/uL — ABNORMAL HIGH (ref 4.0–10.5)
nRBC: 1 % — ABNORMAL HIGH (ref 0.0–0.2)

## 2021-04-18 LAB — RENAL FUNCTION PANEL
Albumin: 2 g/dL — ABNORMAL LOW (ref 3.5–5.0)
Anion gap: 13 (ref 5–15)
BUN: 69 mg/dL — ABNORMAL HIGH (ref 8–23)
CO2: 25 mmol/L (ref 22–32)
Calcium: 9.1 mg/dL (ref 8.9–10.3)
Chloride: 94 mmol/L — ABNORMAL LOW (ref 98–111)
Creatinine, Ser: 3.67 mg/dL — ABNORMAL HIGH (ref 0.44–1.00)
GFR, Estimated: 12 mL/min — ABNORMAL LOW (ref >60)
Glucose, Bld: 119 mg/dL — ABNORMAL HIGH (ref 70–99)
Phosphorus: 3.3 mg/dL (ref 2.5–4.6)
Potassium: 4 mmol/L (ref 3.5–5.1)
Sodium: 132 mmol/L — ABNORMAL LOW (ref 135–145)

## 2021-04-18 LAB — GLUCOSE, CAPILLARY
Glucose-Capillary: 113 mg/dL — ABNORMAL HIGH (ref 70–99)
Glucose-Capillary: 97 mg/dL (ref 70–99)
Glucose-Capillary: 216 mg/dL — ABNORMAL HIGH (ref 70–99)
Glucose-Capillary: 175 mg/dL — ABNORMAL HIGH (ref 70–99)
Glucose-Capillary: 164 mg/dL — ABNORMAL HIGH (ref 70–99)
Glucose-Capillary: 155 mg/dL — ABNORMAL HIGH (ref 70–99)

## 2021-04-18 LAB — HEPATIC FUNCTION PANEL
ALT: 23 U/L (ref 0–44)
AST: 22 U/L (ref 15–41)
Albumin: 2 g/dL — ABNORMAL LOW (ref 3.5–5.0)
Alkaline Phosphatase: 136 U/L — ABNORMAL HIGH (ref 38–126)
Bilirubin, Direct: 0.3 mg/dL — ABNORMAL HIGH (ref 0.0–0.2)
Indirect Bilirubin: 0.4 mg/dL (ref 0.3–0.9)
Total Bilirubin: 0.7 mg/dL (ref 0.3–1.2)
Total Protein: 6.4 g/dL — ABNORMAL LOW (ref 6.5–8.1)

## 2021-04-18 LAB — APTT: aPTT: 28 seconds (ref 24–36)

## 2021-04-18 LAB — MAGNESIUM: Magnesium: 2.3 mg/dL (ref 1.7–2.4)

## 2021-04-18 MED ORDER — HEPARIN SODIUM (PORCINE) 1000 UNIT/ML IJ SOLN
INTRAMUSCULAR | Status: AC
Start: 2021-04-18 — End: 2021-04-19
  Filled 2021-04-18: qty 4

## 2021-04-18 MED ORDER — SODIUM CHLORIDE 0.9% FLUSH: 10.0000 mL | INTRAVENOUS | Status: DC | PRN

## 2021-04-18 MED ORDER — SIMETHICONE 80 MG PO CHEW
80.0000 mg | CHEWABLE_TABLET | Freq: Four times a day (QID) | ORAL | Status: DC | PRN
  Administered 2021-04-18 – 2021-04-23 (×2): 80 mg via ORAL
  Filled 2021-04-18 (×3): qty 1

## 2021-04-18 MED ORDER — GUAIFENESIN 100 MG/5ML PO LIQD
15.0000 mL | Freq: Four times a day (QID) | ORAL | Status: DC | PRN
  Administered 2021-04-18 – 2021-04-26 (×5): 15 mL via ORAL
  Filled 2021-04-18 (×5): qty 15

## 2021-04-18 MED ORDER — SODIUM CHLORIDE 0.9% FLUSH
10.0000 mL | Freq: Two times a day (BID) | INTRAVENOUS | Status: DC
  Administered 2021-04-18 – 2021-04-28 (×18): 10 mL

## 2021-04-18 NOTE — Hospital Course (Addendum)
The patient is 76 year old obese female with known OSA OHS O2 dependent at 3 L nasal cannula who fell and suffered a fracture left femur.  She developed worsening renal failure while in the hospital required a HD catheter that was tunneled and placed on 04/11/2021 per interventional radiology.  On 04/12/2021 she had worsening respiratory failure she was obtunded and was transferred to intensive care unit for further evaluation and treatment by pulmonary critical care.    Significant Events:   04/12/2021 transferred intensive care unit; CRRT started 3/4: improved mentation  3/5: CRRT stopped 3/6: no UOP, increasing creatinine 3/7: tolerated iHD 3/8: d/c a-line. Transfer to progressive. 3/9 TRH assumed care and Dialysis to be attempted again and continues to have poor UOP 3/10 she was able to be weaned to 8 L [place] high flow nasal cannula with no respiratory distress; next dialysis session is tomorrow 3/11 underwent dialysis today had had 1500 mL removed. Had to have Midodrine for BP support. PT/OT recommending SNF and Ortho recommending staple Removal Monday  3/12 patient continued to complain of some abdominal pain and has not had a bowel movement so we will continue bowel regimen and obtain a CT of the abdomen pelvis with oral contrast.  Plan is for dialysis on Tuesday 3/14 now. CT scan of the abdomen pelvis done and showed no acute abnormalities but did show a moderate stool burden.  He was given a bisacodyl suppository yesterday and another 1 today with improvement of her constipation.  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

## 2021-04-18 NOTE — Progress Notes (Signed)
[facility] ROUNDING NOTE   Subjective:   Interval History: 76 year old lady with stage IIIb chronic kidney disease.  Admitted 04/03/2021 with  fall and hip fracture.  Low blood pressures postoperatively suspecting acute tubular necrosis.  Dialysis initiated 04/11/2021 with transition to CRRT 04/12/2021 with BiPAP dependent respiratory failure.  Previous history includes a history of CAD status post CABG. CRRT discontinued on 04/14/2021.  Looking to maybe transition to intermittent hemodialysis.  Blood pressure 125/64 pulse 107 temperature 97.5 O2 sats 93% high flow nasal cannula  Sodium 132 potassium 4 chloride 94 CO2 25 BUN 69 creatinine 3.67 glucose 119 calcium 9.1 phosphorus 3.3 magnesium 2.3  Objective:  Vital signs in last 24 hours:  Temp:  [97.5 F (36.4 C)-98.3 F (36.8 C)] 97.5 F (36.4 C) (03/09 0323) Pulse Rate:  [66-114] 86 (03/09 0700) Resp:  [19-32] 32 (03/09 0700) BP: (89-175)/(44-108) 111/66 (03/09 0700) SpO2:  [88 %-100 %] 93 % (03/09 0755) Arterial Line BP: (96-165)/(46-72) 138/60 (03/09 0600) FiO2 (%):  [30 %-40 %] 30 % (03/09 0755) Weight:  [99.2 kg] 99.2 kg (03/09 0323)  Weight change: 1 kg Filed Weights   04/16/21 1922 04/17/21 0500 04/18/21 0323  Weight: 98.2 kg 97.3 kg 99.2 kg    Intake/Output: I/O last 3 completed shifts: In: 1080 [P.O.:1080] Out: 2000 [Other:2000]   Intake/Output this shift:  No intake/output data recorded.  Gen obese, somnolent, deconditioned, on BiPAP RRR no RG Abd soft obese ntnd no mass or ascites +bs MS no joint effusions or deformity Ext trace LE/ UE edema IJ [facility] noted   Basic Metabolic Panel: Recent Labs  Lab 04/14/21 0317 04/14/21 0318 04/15/21 0315 04/15/21 0320 04/15/21 1726 04/16/21 0352 04/16/21 1534 04/17/21 0414 04/18/21 0315  NA  --    < > 135  --  134* 132* 130* 133* 132*  K  --    < > 4.4  --  4.4 4.5 5.1 3.5 4.0  CL  --    < > 101  --   --  98 97* 98 94*  CO2  --    < > 23  --   --  '23  23 26 25  '$ GLUCOSE  --    < > 130*  --   --  109* 193* 127* 119*  BUN  --    < > 41*  --   --  75* 90* 43* 69*  CREATININE  --    < > 2.42*  --   --  3.58* 4.14* 2.57* 3.67*  CALCIUM  --    < > 9.7  --   --  9.2 9.2 8.9 9.1  MG 2.7*  --   --  2.7*  --  2.8*  --  2.2 2.3  PHOS  --    < > 2.5  --   --  3.3 3.5 2.1* 3.3   < > = values in this interval not displayed.     Liver Function Tests: Recent Labs  Lab 04/15/21 0315 04/16/21 0352 04/16/21 1534 04/17/21 0414 04/18/21 0315  ALBUMIN 2.0* 1.9* 1.8* 2.0* 2.0*    No results for input(s): LIPASE, AMYLASE in the last 168 hours. No results for input(s): AMMONIA in the last 168 hours.  CBC: Recent Labs  Lab 04/11/21 0850 04/12/21 0233 04/12/21 1454 04/13/21 0949 04/13/21 1134 04/13/21 1627 04/14/21 0317 04/14/21 1225 04/14/21 1833 04/15/21 1726  WBC 9.0 12.3*  --  15.3*  --   --  15.5*  --   --   --  NEUTROABS 7.7 10.8*  --  13.7*  --   --  13.9*  --   --   --   HGB 10.5* 9.0*   < > 8.9*   < > 9.2* 8.5* 9.9* 9.9* 8.8*  HCT 32.8* 28.6*   < > 29.5*   < > 27.0* 28.2* 29.0* 29.0* 26.0*  MCV 94.0 93.5  --  95.5  --   --  95.6  --   --   --   PLT 204 207  --  208  --   --  224  --   --   --    < > = values in this interval not displayed.     Cardiac Enzymes: No results for input(s): CKTOTAL, CKMB, CKMBINDEX, TROPONINI in the last 168 hours.  BNP: Invalid input(s): POCBNP  CBG: Recent Labs  Lab 04/17/21 1843 04/17/21 1924 04/17/21 2335 04/18/21 0331 04/18/21 0712  GLUCAP 129* 129* 107* 113* 97     Microbiology: Results for orders placed or performed during the hospital encounter of 04/03/21  Resp Panel by RT-PCR (Flu A&B, Covid) Nasopharyngeal Swab     Status: None   Collection Time: 04/03/21  9:40 AM   Specimen: Nasopharyngeal Swab; Nasopharyngeal(NP) swabs in vial transport medium  Result Value Ref Range Status   SARS Coronavirus 2 by RT PCR NEGATIVE NEGATIVE Final    Comment: (NOTE) SARS-CoV-2 target  nucleic acids are NOT DETECTED.  The SARS-CoV-2 RNA is generally detectable in upper respiratory specimens during the acute phase of infection. The lowest concentration of SARS-CoV-2 viral copies this assay can detect is 138 copies/mL. A negative result does not preclude SARS-Cov-2 infection and should not be used as the sole basis for treatment or other patient management decisions. A negative result may occur with  improper specimen collection/handling, submission of specimen other than nasopharyngeal swab, presence of viral mutation(s) within the areas targeted by this assay, and inadequate number of viral copies(<138 copies/mL). A negative result must be combined with clinical observations, patient history, and epidemiological information. The expected result is Negative.  Fact Sheet for Patients:  EntrepreneurPulse.com.au  Fact Sheet for Healthcare Providers:  IncredibleEmployment.be  This test is no t yet approved or cleared by the [place] FDA and  has been authorized for detection and/or diagnosis of SARS-CoV-2 by FDA under an Emergency Use Authorization (EUA). This EUA will remain  in effect (meaning this test can be used) for the duration of the COVID-19 declaration under Section 564(b)(1) of the Act, 21 U.S.C.section 360bbb-3(b)(1), unless the authorization is terminated  or revoked sooner.       Influenza A by PCR NEGATIVE NEGATIVE Final   Influenza B by PCR NEGATIVE NEGATIVE Final    Comment: (NOTE) The Xpert Xpress SARS-CoV-2/FLU/RSV plus assay is intended as an aid in the diagnosis of influenza from Nasopharyngeal swab specimens and should not be used as a sole basis for treatment. Nasal washings and aspirates are unacceptable for Xpert Xpress SARS-CoV-2/FLU/RSV testing.  Fact Sheet for Patients: EntrepreneurPulse.com.au  Fact Sheet for Healthcare  Providers: IncredibleEmployment.be  This test is not yet approved or cleared by the [place] FDA and has been authorized for detection and/or diagnosis of SARS-CoV-2 by FDA under an Emergency Use Authorization (EUA). This EUA will remain in effect (meaning this test can be used) for the duration of the COVID-19 declaration under Section 564(b)(1) of the Act, 21 U.S.C. section 360bbb-3(b)(1), unless the authorization is terminated or revoked.  Performed at [facility], [address] [place], [place] [address]   Surgical PCR Screen     Status: None   Collection Time: 04/04/21  3:45 PM   Specimen: Nasal Mucosa; Nasal Swab  Result Value Ref Range Status   MRSA, PCR NEGATIVE NEGATIVE Final   Staphylococcus aureus NEGATIVE NEGATIVE Final    Comment: (NOTE) The Xpert SA Assay (FDA approved for NASAL specimens in patients 11 years of age and older), is one component of a comprehensive surveillance program. It is not intended to diagnose infection nor to guide or monitor treatment. Performed at [facility], [place] [address] [place], [place] [address]    *Note: Due to a large number of results and/or encounters for the requested time period, some results have not been displayed. A complete set of results can be found in Results Review.    Coagulation Studies: No results for input(s): LABPROT, INR in the last 72 hours.  Urinalysis: No results for input(s): COLORURINE, LABSPEC, PHURINE, GLUCOSEU, HGBUR, BILIRUBINUR, KETONESUR, PROTEINUR, UROBILINOGEN, NITRITE, LEUKOCYTESUR in the last 72 hours.  Invalid input(s): APPERANCEUR    Imaging: DG CHEST PORT 1 VIEW  Result Date: 04/17/2021 CLINICAL DATA:  Shortness of breath EXAM: PORTABLE CHEST 1 VIEW COMPARISON:  04/12/2021, 01/07/2021 FINDINGS: Post sternotomy changes. Left-sided central venous catheter tip over the right atrium. Right-sided multi lead pacing device. Borderline cardiomegaly with central  vascular congestion and probable small effusions. Subsegmental atelectasis left base. Aortic atherosclerosis. No pneumothorax IMPRESSION: 1. Cardiomegaly with mild central congestion and probable small pleural effusions 2. Subsegmental atelectasis at the left base Electronically Signed   By: Donavan Foil M.D.   On: 04/17/2021 17:57     Medications:    sodium chloride      allopurinol  100 mg Oral Daily   aspirin  81 mg Oral Daily   Chlorhexidine Gluconate Cloth  6 each Topical Q0600   clopidogrel  75 mg Oral Daily   cycloSPORINE  1 drop Both Eyes BID   heparin injection (subcutaneous)  5,000 Units Subcutaneous Q8H   insulin aspart  0-6 Units Subcutaneous TID WC   mouth rinse  15 mL Mouth Rinse q12n4p   midodrine  10 mg Oral 3 times per day on Tue Thu Sat   mometasone-formoterol  2 puff Inhalation BID   pantoprazole sodium  40 mg Oral BID   polyethylene glycol  17 g Oral Daily   polyvinyl alcohol  1 drop Both Eyes Q12H   predniSONE  10 mg Oral Q breakfast   rosuvastatin  5 mg Oral QPM   senna  1 tablet Oral BID   sodium chloride flush  10-40 mL Intracatheter Q12H   acetaminophen, albuterol, bisacodyl, guaiFENesin, hydrALAZINE, HYDROcodone-acetaminophen, melatonin, ondansetron (ZOFRAN) IV, sodium chloride, sodium chloride flush  Assessment/ Plan:  Acute on chronic kidney disease.  Baseline creatinine about 1.3 mg/dL.  Status post hip fracture now with hypotension and respiratory distress.  Patient placed on CRRT 04/12/2021 This was discontinued on 04/14/2021.  Appears to have poor urine output.  Hemodialysis requested although historically did not tolerate.  She continues a full CODE STATUS.  She appeared to tolerate dialysis 04/16/2021 with removal of 2 L.  We will attempt dialysis again on 04/18/2021 ANEMIA-no ESA's at this point. MBD-appears to be stable at this point. HTN/VOL-  She has little urine output.  She appeared to tolerate dialysis 04/16/2021 Status post hip fracture at  04/03/2021    LOS: [place] '@TODAY''@7'$ :59 AM

## 2021-04-18 NOTE — Progress Notes (Signed)
PROGRESS NOTE    LOWELLA KINDLEY  FUX:[id number] DOB: [birth year] DOA: 04/03/2021 PCP: Lujean Amel, MD   Brief Narrative:  The patient is 76 year old obese female with known OSA OHS O2 dependent at 3 L nasal cannula who fell and suffered a fracture left femur.  She developed worsening renal failure while in the hospital required a HD catheter that was tunneled and placed on 04/11/2021 per interventional radiology.  On 04/12/2021 she had worsening respiratory failure she was obtunded and was transferred to intensive care unit for further evaluation and treatment by pulmonary critical care.    Significant Events:   04/12/2021 transferred intensive care unit; CRRT started 3/4: improved mentation  3/5: CRRT stopped 3/6: no UOP, increasing creatinine 3/7: tolerated iHD 3/8: d/c a-line. Transfer to progressive. 3/9 TRH assumed care and Dialysis to be attempted again and continues to have poor UOP    Assessment and Plan: * Closed left hip fracture ([place]) secondary to fall at home -Patient presents after having a fall found to have a left hip and left superior pubic ramus fracture.  Orthopedics consulted with plans to possibly take patient to the operating room in a.m. based off the geriatric perioperative cardiac risk assessment score patient has 16.1% probability of perioperative cardiac arrest or myocardial infarction. -Admitted to a surgical telemetry bed -Hip fracture order set utilized and now s/p ORIF -Hydrocodone/morphine IV as needed pain -Transitions of care consulted    Hypokalemia -Improved. K+ is now 4.0 -Continue to monitor and adjust doses as needed  Fall at home, initial encounter -Resulted in Hip Fracture and she is s/p ORIF  Chronic kidney disease, stage III B (moderate) ([place]) -On admission creatinine 1.69 which appears slightly above baseline which appears to be 1.3-1.5. -Went into Renal Failure and now had  Acute on chronic renal failure with anuria -Did not tolerate  iHD due to hypotension so required CRRT 3/3-3/5.  Tolerated iHD 3/7 with net UF 2L Primary management per nephrology Next HD planned 3/9 Continue Midodrine 10 mg po TID   Closed fracture of left superior pubic ramus ([place]) -Secondary to fall.  No surgical intervention recommended for treatment of this fracture.  Thrombocytopenia ([place]) -Improved. Platelet Count is now 177 -Continue to Monitor for S/Sx of Bleeding; no overt bleeding noted -Repeat CBC in the AM   Respiratory failure ([place]) -Acute on Chronic Hypoxic/Hypercapneic Failure  -Patient has history of obesity hypoventilation syndrome and OSA.  She is chronically on 3 L of oxygen at baseline. -SpO2: 94 % O2 Flow Rate (L/min): 20 L/min (found on 20) FiO2 (%): 30 % -Suspect this will continue to improve with volume removal from HD  OSA/OHS Wean HHF for O2 saturations 88-92% BiPAP qHS Will need home BiPAP set up prior to discharge  Diabetes mellitus without complication (HCC) Last hemoglobin A1c was 6.5 less than 3 months ago.  Patient is on Jardiance 10 mg daily. -Hypoglycemic protocols -Held Jardiance -CBGs before every meal with sensitive SSI -Adjust insulin regimen as needed -CBGs ranging from 97-216 now  CHB (complete heart block) (HCC) S/p PPM/ICD   Chronic diastolic CHF (congestive heart failure) (HCC) -Chronic.  Last echocardiogram noted EF of 55 to 60% with grade 1 diastolic dysfunction in 0/2542.  BNP improved to 333.8 from 712 on 2/6.  Patient has some mild lower extremity edema but does not appear grossly fluid overloaded.  She had been seen by her cardiologist Dr. Burt Knack on 2/20 and recommended to increase torsemide to 80 mg daily. -Strict I&Os and  daily weights -Continued current dose of Torsemide but now held due to the patient getting Dialysis   S/P CABG x 5 -Last cardiac catheter was in 02/2019 where all territories were noted to have adequate coronary flow via bypasses or native vessels with  stents. -Continue aspirin, Plavix, and Crestor -BB and Isororbide held due to Hypotension earlier   Mixed hypercholesterolemia and hypertriglyceridemia -Continue Crestor  HTN (hypertension), benign Home medication regimen includes bisoprolol 7.5 mg daily, Imdur 90 mg daily, torsemide 80 mg daily which were held due to her softer BP -GDMT for Chronic Diastolic Heart Failure held due to Hypotension and will resume as Able   ANEMIA -Hemoglobin 10.8 g/dL which appears around lower end of patient's baseline which ranges from 10- 12 g/dL. -Now Hgb/Hct dropped to 8.7/27.1  -Continue to monitor H&H and continue to Monitor for S/Sx of Bleeding -Repeat CBC in the AM   Adrenal insufficiency Continue home dose of prednisone '10mg'$    LLE swelling.  3/4 DVT study negative.    DVT prophylaxis: heparin injection 5,000 Units Start: 04/15/21 1400    Code Status: Full Code Family Communication: No family present at bedside  Disposition Plan:  Level of care: Progressive Status is: Inpatient Remains inpatient appropriate because:needs Snf and weaning of O2    Consultants:  Cardiology  Orthopedic Surgery PCCM Transfer Nephrology   Procedures:  As above  Antimicrobials:  Anti-infectives (From admission, onward)    Start     Dose/Rate Route Frequency Ordered Stop   04/11/21 1530  vancomycin (VANCOCIN) IVPB 1000 mg/200 mL premix        1,000 mg 200 mL/hr over 60 Minutes Intravenous  Once 04/11/21 1432 04/12/21 1952   04/04/21 1423  vancomycin (VANCOCIN) powder  Status:  Discontinued          As needed 04/04/21 1424 04/04/21 1551   04/04/21 1200  vancomycin (VANCOREADY) IVPB 1500 mg/300 mL        1,500 mg 150 mL/hr over 120 Minutes Intravenous On call to O.R. 04/03/21 1241 04/04/21 1529   04/04/21 1145  ceFAZolin (ANCEF) IVPB 2g/100 mL premix  Status:  Discontinued        2 g 200 mL/hr over 30 Minutes Intravenous On call to O.R. 04/04/21 9563 04/04/21 1717        Subjective: Seen  and examined at bedside and states that she was having some abdominal pressure and feel like she needed to use the bathroom.  No nausea or vomiting.  Denies any lightheadedness or dizziness denies any chest pain or shortness of breath but continues to be on significant mount of oxygen.  No other concerns or complaints this time.  Objective: Vitals:   04/18/21 1127 04/18/21 1344 04/18/21 1345 04/18/21 1531  BP:      Pulse:   (!) 102   Resp:   (!) 22   Temp: 97.6 F (36.4 C)   97.6 F (36.4 C)  TempSrc: Oral   Oral  SpO2:  94%    Weight:      Height:       No intake or output data in the 24 hours ending 04/18/21 1932 Filed Weights   04/16/21 1922 04/17/21 0500 04/18/21 0323  Weight: 98.2 kg 97.3 kg 99.2 kg   Examination: Physical Exam:  Constitutional: WN/WD obese Caucasian female currently no acute distress appears a little anxious Respiratory: Diminished to auscultation bilaterally with coarse breath sounds and some crackles and some slight rhonchi.  No appreciable wheezing or rales. Normal respiratory  effort and patient is not tachypenic. No accessory muscle use.  Wearing significant mount of oxygen Cardiovascular: Slightly Tachycardic Rate but regular rhythm, no murmurs / rubs / gallops. S1 and S2 auscultated. 1+ LE edema Abdomen: Soft, non-tender, Distended 2/2 body habitus. Bowel sounds positive.  GU: Deferred. Musculoskeletal: No clubbing / cyanosis of digits/nails. No joint deformity upper and lower extremities.  Data Reviewed: I have personally reviewed following labs and imaging studies  CBC: Recent Labs  Lab 04/12/21 0233 04/12/21 1454 04/13/21 0949 04/13/21 1134 04/14/21 0317 04/14/21 1225 04/14/21 1833 04/15/21 1726 04/18/21 1002  WBC 12.3*  --  15.3*  --  15.5*  --   --   --  10.8*  NEUTROABS 10.8*  --  13.7*  --  13.9*  --   --   --  8.9*  HGB 9.0*   < > 8.9*   < > 8.5* 9.9* 9.9* 8.8* 8.7*  HCT 28.6*   < > 29.5*   < > 28.2* 29.0* 29.0* 26.0* 27.1*  MCV  93.5  --  95.5  --  95.6  --   --   --  93.1  PLT 207  --  208  --  224  --   --   --  177   < > = values in this interval not displayed.   Basic Metabolic Panel: Recent Labs  Lab 04/14/21 0317 04/14/21 0318 04/15/21 0315 04/15/21 0320 04/15/21 1726 04/16/21 0352 04/16/21 1534 04/17/21 0414 04/18/21 0315  NA  --    < > 135  --  134* 132* 130* 133* 132*  K  --    < > 4.4  --  4.4 4.5 5.1 3.5 4.0  CL  --    < > 101  --   --  98 97* 98 94*  CO2  --    < > 23  --   --  '23 23 26 25  '$ GLUCOSE  --    < > 130*  --   --  109* 193* 127* 119*  BUN  --    < > 41*  --   --  75* 90* 43* 69*  CREATININE  --    < > 2.42*  --   --  3.58* 4.14* 2.57* 3.67*  CALCIUM  --    < > 9.7  --   --  9.2 9.2 8.9 9.1  MG 2.7*  --   --  2.7*  --  2.8*  --  2.2 2.3  PHOS  --    < > 2.5  --   --  3.3 3.5 2.1* 3.3   < > = values in this interval not displayed.   GFR: Estimated Creatinine Clearance: 14.4 mL/min (A) (by C-G formula based on SCr of 3.67 mg/dL (H)). Liver Function Tests: Recent Labs  Lab 04/16/21 0352 04/16/21 1534 04/17/21 0414 04/18/21 0315 04/18/21 0331  AST  --   --   --   --  22  ALT  --   --   --   --  23  ALKPHOS  --   --   --   --  136*  BILITOT  --   --   --   --  0.7  PROT  --   --   --   --  6.4*  ALBUMIN 1.9* 1.8* 2.0* 2.0* 2.0*   No results for input(s): LIPASE, AMYLASE in the last 168 hours. No results for input(s): AMMONIA in the  last 168 hours. Coagulation Profile: No results for input(s): INR, PROTIME in the last 168 hours. Cardiac Enzymes: No results for input(s): CKTOTAL, CKMB, CKMBINDEX, TROPONINI in the last 168 hours. BNP (last 3 results) No results for input(s): PROBNP in the last 8760 hours. HbA1C: No results for input(s): HGBA1C in the last 72 hours. CBG: Recent Labs  Lab 04/17/21 2335 04/18/21 0331 04/18/21 0712 04/18/21 1109 04/18/21 1516  GLUCAP 107* 113* 97 216* 175*   Lipid Profile: No results for input(s): CHOL, HDL, LDLCALC, TRIG, CHOLHDL,  LDLDIRECT in the last 72 hours. Thyroid Function Tests: No results for input(s): TSH, T4TOTAL, FREET4, T3FREE, THYROIDAB in the last 72 hours. Anemia Panel: No results for input(s): VITAMINB12, FOLATE, FERRITIN, TIBC, IRON, RETICCTPCT in the last 72 hours. Sepsis Labs: No results for input(s): PROCALCITON, LATICACIDVEN in the last 168 hours.  No results found for this or any previous visit (from the past 240 hour(s)).   Radiology Studies: DG CHEST PORT 1 VIEW  Result Date: 04/17/2021 CLINICAL DATA:  Shortness of breath EXAM: PORTABLE CHEST 1 VIEW COMPARISON:  04/12/2021, 01/07/2021 FINDINGS: Post sternotomy changes. Left-sided central venous catheter tip over the right atrium. Right-sided multi lead pacing device. Borderline cardiomegaly with central vascular congestion and probable small effusions. Subsegmental atelectasis left base. Aortic atherosclerosis. No pneumothorax IMPRESSION: 1. Cardiomegaly with mild central congestion and probable small pleural effusions 2. Subsegmental atelectasis at the left base Electronically Signed   By: Donavan Foil M.D.   On: 04/17/2021 17:57     Scheduled Meds:  allopurinol  100 mg Oral Daily   aspirin  81 mg Oral Daily   Chlorhexidine Gluconate Cloth  6 each Topical Q0600   clopidogrel  75 mg Oral Daily   cycloSPORINE  1 drop Both Eyes BID   heparin injection (subcutaneous)  5,000 Units Subcutaneous Q8H   insulin aspart  0-6 Units Subcutaneous TID WC   mouth rinse  15 mL Mouth Rinse q12n4p   midodrine  10 mg Oral 3 times per day on Tue Thu Sat   mometasone-formoterol  2 puff Inhalation BID   pantoprazole sodium  40 mg Oral BID   polyethylene glycol  17 g Oral Daily   polyvinyl alcohol  1 drop Both Eyes Q12H   predniSONE  10 mg Oral Q breakfast   rosuvastatin  5 mg Oral QPM   senna  1 tablet Oral BID   sodium chloride flush  10-40 mL Intracatheter Q12H   Continuous Infusions:  sodium chloride      LOS: 15 days   [name], DO [facility] Available via Epic secure chat 7am-7pm After these hours, please refer to coverage provider listed on [email or web address] 04/18/2021, 7:32 PM

## 2021-04-18 NOTE — Assessment & Plan Note (Signed)
-  Resulted in Hip Fracture and she is s/p ORIF ?

## 2021-04-18 NOTE — Progress Notes (Signed)
Physical Therapy Treatment ?Patient Details ?Name: [name] ?MRN: [id number] ?DOB: 02-Apr-1945 ?Today's Date: 04/18/2021 ? ? ?History of Present Illness Pt is a 76 y.o. female admitted 04/03/21 after fall at home when leg gave out sustaining L displaced intertrochanteric hip fx. S/p L hip cephalomedullary nail and closed management of superior pubic ramus fx 2/23. On 3/3, pt with worsening respiratory status and encephalopathy, transferred to ICU and started on BiPAP. PMH includes afib, CAD, CHF, HTN, pacemaker, asthma, DM2, stroke, OSA, cervical sx, back sx, R ankle fx. ? ?  ?PT Comments  ? ? RN in room on entry and able to assist in safe transfer from bed to chair using Stedy. Pt making progress towards her goals requiring modA for bed mobility and modAx2 for standing in [place] for transfer to recliner. Pt with increased DoE and requires cuing for purse lip breathing to regain and maintain SpO2 >90%O2. D/c plans remain appropriate at this time. PT will continue to follow acutely.    ?Recommendations for follow up therapy are one component of a multi-disciplinary discharge planning process, led by the attending physician.  Recommendations may be updated based on patient status, additional functional criteria and insurance authorization. ? ?Follow Up Recommendations ? Skilled nursing-short term rehab (<3 hours/day) ?  ?  ?Assistance Recommended at Discharge Frequent or constant Supervision/Assistance  ?Patient can return home with the following Two people to help with walking and/or transfers;Two people to help with bathing/dressing/bathroom;Assistance with cooking/housework;Assist for transportation;Help with stairs or ramp for entrance ?  ?Equipment Recommendations ? Rolling walker (2 wheels);BSC/3in1;Wheelchair (measurements PT);Wheelchair cushion (measurements PT);Hospital bed  ?  ?   ?Precautions / Restrictions Precautions ?Precautions: Fall;Other (comment) ?Precaution Comments: Wears 3L O2 baseline, on  HHFNC ?Restrictions ?Weight Bearing Restrictions: Yes ?LLE Weight Bearing: Weight bearing as tolerated  ?  ? ?Mobility ? Bed Mobility ?Overal bed mobility: Needs Assistance ?Bed Mobility: Supine to Sit, Rolling ?  ?  ?Supine to sit: Mod assist, HOB elevated ?  ?  ?General bed mobility comments: maximal multimodal cuing for management of LE off bed and reaching across body to bedrail, once pt had a hold requires modA for bring hips to Eob with pad and min A for bringing trunk to upright ?  ? ?Transfers ?Overall transfer level: Needs assistance ?Equipment used: Ambulation equipment used ?Transfers: Sit to/from Stand, Bed to chair/wheelchair/BSC ?Sit to Stand: Mod assist, +2 physical assistance, From elevated surface ?  ?  ?  ?  ?  ?General transfer comment: modA x2 for powerup to [facility], pt incontinent of stool and requires modA for maintaining upright for approximately 2 min for pericare before sitting back on Stedy pads, min A for power up from [employer] ?Transfer via Lift Equipment: Stedy ? ? ?  ?Balance Overall balance assessment: Needs assistance ?Sitting-balance support: Feet supported, Bilateral upper extremity supported ?Sitting balance-Leahy Scale: Fair ?  ?  ?Standing balance support: Bilateral upper extremity supported, During functional activity ?Standing balance-Leahy Scale: Zero ?  ?  ?  ?  ?  ?  ?  ?  ?  ?  ?  ?  ?  ? ?  ?Cognition Arousal/Alertness: Awake/alert ?Behavior During Therapy: Flat affect ?Overall Cognitive Status: Impaired/Different from baseline ?Area of Impairment: Following commands, Safety/judgement, Awareness, Problem solving ?  ?  ?  ?  ?  ?  ?  ?  ?  ?  ?  ?Following Commands: Follows one step commands with increased time, Follows multi-step commands with increased time ?Safety/Judgement:  Decreased awareness of safety, Decreased awareness of deficits ?Awareness: Intellectual ?Problem Solving: Slow processing, Decreased initiation, Difficulty sequencing, Requires verbal cues, Requires  tactile cues ?General Comments: slower to respond but responds appropriately. increased time to follow directions. decreased awareness of deficits as on entry, pt reports "I thought I was going home today" (reoriented to medical complexities and currently in ICU) ?  ?  ? ?  ?   ?General Comments General comments (skin integrity, edema, etc.): SpO2 on 25 L HHFNC requires cuing for purse lip breathing to regain SpO2 >90 after transfer to chair ?  ?  ? ?Pertinent Vitals/Pain Pain Assessment ?Pain Assessment: Faces ?Faces Pain Scale: Hurts little more ?Pain Location: chronic back pain ?Pain Descriptors / Indicators: Discomfort, Grimacing, Guarding ?Pain Intervention(s): Limited activity within patient's tolerance, Monitored during session, Repositioned  ? ? ? ?PT Goals (current goals can now be found in the care plan section) Acute Rehab PT Goals ?PT Goal Formulation: With patient ?Time For Goal Achievement: 04/19/21 ?Potential to Achieve Goals: Good ?Progress towards PT goals: Progressing toward goals ? ?  ?Frequency ? ? ? Min 2X/week ? ? ? ?  ?PT Plan Current plan remains appropriate  ? ? ?   ?AM-PAC PT "6 Clicks" Mobility   ?Outcome Measure ? Help needed turning from your back to your side while in a flat bed without using bedrails?: Total ?Help needed moving from lying on your back to sitting on the side of a flat bed without using bedrails?: Total ?Help needed moving to and from a bed to a chair (including a wheelchair)?: Total ?Help needed standing up from a chair using your arms (e.g., wheelchair or bedside chair)?: Total ?Help needed to walk in hospital room?: Total ?Help needed climbing 3-5 steps with a railing? : Total ?6 Click Score: 6 ? ?  ?End of Session Equipment Utilized During Treatment: Oxygen ?Activity Tolerance: Patient tolerated treatment well;Patient limited by fatigue ?Patient left: in chair;with call bell/phone within reach;with chair alarm set ?Nurse Communication: Mobility status ?PT Visit  Diagnosis: Other abnormalities of gait and mobility (R26.89);Muscle weakness (generalized) (M62.81);Pain ?Pain - Right/Left: Left ?Pain - part of body: Leg ?  ? ? ?Time: 3419-3790 ?PT Time Calculation (min) (ACUTE ONLY): 21 min ? ?Charges:  $Therapeutic Activity: 8-22 mins          ?          ? ?Loyd Salvador B. Migdalia Dk PT, DPT ?Acute Rehabilitation Services ?Pager [phone] ?Office [phone] ? ? ? ?[place] ?04/18/2021, 4:21 PM ? ?

## 2021-04-19 ENCOUNTER — Inpatient Hospital Stay (HOSPITAL_COMMUNITY): Payer: Medicare Other

## 2021-04-19 DIAGNOSIS — I5032 Chronic diastolic (congestive) heart failure: Secondary | ICD-10-CM | POA: Diagnosis not present

## 2021-04-19 DIAGNOSIS — I10 Essential (primary) hypertension: Secondary | ICD-10-CM | POA: Diagnosis not present

## 2021-04-19 DIAGNOSIS — W19XXXA Unspecified fall, initial encounter: Secondary | ICD-10-CM | POA: Diagnosis not present

## 2021-04-19 DIAGNOSIS — I442 Atrioventricular block, complete: Secondary | ICD-10-CM | POA: Diagnosis not present

## 2021-04-19 LAB — MAGNESIUM: Magnesium: 2.1 mg/dL (ref 1.7–2.4)

## 2021-04-19 LAB — CBC WITH DIFFERENTIAL/PLATELET
Abs Immature Granulocytes: 0.32 10*3/uL — ABNORMAL HIGH (ref 0.00–0.07)
Basophils Absolute: 0 10*3/uL (ref 0.0–0.1)
Basophils Relative: 0 %
Eosinophils Absolute: 0 10*3/uL (ref 0.0–0.5)
Eosinophils Relative: 0 %
HCT: 30.1 % — ABNORMAL LOW (ref 36.0–46.0)
Hemoglobin: 9.5 g/dL — ABNORMAL LOW (ref 12.0–15.0)
Immature Granulocytes: 3 %
Lymphocytes Relative: 6 %
Lymphs Abs: 0.7 10*3/uL (ref 0.7–4.0)
MCH: 29.4 pg (ref 26.0–34.0)
MCHC: 31.6 g/dL (ref 30.0–36.0)
MCV: 93.2 fL (ref 80.0–100.0)
Monocytes Absolute: 0.8 10*3/uL (ref 0.1–1.0)
Monocytes Relative: 7 %
Neutro Abs: 9.4 10*3/uL — ABNORMAL HIGH (ref 1.7–7.7)
Neutrophils Relative %: 84 %
Platelets: 178 10*3/uL (ref 150–400)
RBC: 3.23 MIL/uL — ABNORMAL LOW (ref 3.87–5.11)
RDW: 16.4 % — ABNORMAL HIGH (ref 11.5–15.5)
WBC: 11.2 10*3/uL — ABNORMAL HIGH (ref 4.0–10.5)
nRBC: 0.8 % — ABNORMAL HIGH (ref 0.0–0.2)

## 2021-04-19 LAB — CBC
HCT: 29.6 % — ABNORMAL LOW (ref 36.0–46.0)
Hemoglobin: 9 g/dL — ABNORMAL LOW (ref 12.0–15.0)
MCH: 28.9 pg (ref 26.0–34.0)
MCHC: 30.4 g/dL (ref 30.0–36.0)
MCV: 95.2 fL (ref 80.0–100.0)
Platelets: 174 10*3/uL (ref 150–400)
RBC: 3.11 MIL/uL — ABNORMAL LOW (ref 3.87–5.11)
RDW: 16.9 % — ABNORMAL HIGH (ref 11.5–15.5)
WBC: 8.7 10*3/uL (ref 4.0–10.5)
nRBC: 0.7 % — ABNORMAL HIGH (ref 0.0–0.2)

## 2021-04-19 LAB — RENAL FUNCTION PANEL
Albumin: 2.1 g/dL — ABNORMAL LOW (ref 3.5–5.0)
Anion gap: 14 (ref 5–15)
BUN: 61 mg/dL — ABNORMAL HIGH (ref 8–23)
CO2: 25 mmol/L (ref 22–32)
Calcium: 8.8 mg/dL — ABNORMAL LOW (ref 8.9–10.3)
Chloride: 95 mmol/L — ABNORMAL LOW (ref 98–111)
Creatinine, Ser: 3.86 mg/dL — ABNORMAL HIGH (ref 0.44–1.00)
GFR, Estimated: 11 mL/min — ABNORMAL LOW (ref >60)
Glucose, Bld: 171 mg/dL — ABNORMAL HIGH (ref 70–99)
Phosphorus: 4.8 mg/dL — ABNORMAL HIGH (ref 2.5–4.6)
Potassium: 4.2 mmol/L (ref 3.5–5.1)
Sodium: 134 mmol/L — ABNORMAL LOW (ref 135–145)

## 2021-04-19 LAB — APTT: aPTT: 24 seconds (ref 24–36)

## 2021-04-19 LAB — COMPREHENSIVE METABOLIC PANEL
ALT: 22 U/L (ref 0–44)
AST: 26 U/L (ref 15–41)
Albumin: 2.2 g/dL — ABNORMAL LOW (ref 3.5–5.0)
Alkaline Phosphatase: 150 U/L — ABNORMAL HIGH (ref 38–126)
Anion gap: 16 — ABNORMAL HIGH (ref 5–15)
BUN: 43 mg/dL — ABNORMAL HIGH (ref 8–23)
CO2: 21 mmol/L — ABNORMAL LOW (ref 22–32)
Calcium: 8.6 mg/dL — ABNORMAL LOW (ref 8.9–10.3)
Chloride: 96 mmol/L — ABNORMAL LOW (ref 98–111)
Creatinine, Ser: 2.86 mg/dL — ABNORMAL HIGH (ref 0.44–1.00)
GFR, Estimated: 16 mL/min — ABNORMAL LOW (ref >60)
Glucose, Bld: 122 mg/dL — ABNORMAL HIGH (ref 70–99)
Potassium: 3.7 mmol/L (ref 3.5–5.1)
Sodium: 133 mmol/L — ABNORMAL LOW (ref 135–145)
Total Bilirubin: 1 mg/dL (ref 0.3–1.2)
Total Protein: 6.8 g/dL (ref 6.5–8.1)

## 2021-04-19 LAB — GLUCOSE, CAPILLARY
Glucose-Capillary: 129 mg/dL — ABNORMAL HIGH (ref 70–99)
Glucose-Capillary: 117 mg/dL — ABNORMAL HIGH (ref 70–99)
Glucose-Capillary: 200 mg/dL — ABNORMAL HIGH (ref 70–99)
Glucose-Capillary: 122 mg/dL — ABNORMAL HIGH (ref 70–99)
Glucose-Capillary: 133 mg/dL — ABNORMAL HIGH (ref 70–99)

## 2021-04-19 LAB — PHOSPHORUS: Phosphorus: 3.2 mg/dL (ref 2.5–4.6)

## 2021-04-19 LAB — HEPATITIS B SURFACE ANTIGEN: Hepatitis B Surface Ag: NONREACTIVE

## 2021-04-19 MED ORDER — PENTAFLUOROPROP-TETRAFLUOROETH EX AERO: 1.0000 "application " | INHALATION_SPRAY | CUTANEOUS | Status: DC | PRN

## 2021-04-19 MED ORDER — SODIUM CHLORIDE 0.9 % IV SOLN: 100.0000 mL | INTRAVENOUS | Status: DC | PRN

## 2021-04-19 MED ORDER — HEPARIN SODIUM (PORCINE) 1000 UNIT/ML DIALYSIS
1000.0000 [IU] | INTRAMUSCULAR | Status: DC | PRN
  Filled 2021-04-19: qty 1

## 2021-04-19 MED ORDER — ALTEPLASE 2 MG IJ SOLR
2.0000 mg | Freq: Once | INTRAMUSCULAR | Status: DC | PRN
  Filled 2021-04-19: qty 2

## 2021-04-19 MED ORDER — LIDOCAINE HCL (PF) 1 % IJ SOLN
5.0000 mL | INTRAMUSCULAR | Status: DC | PRN
  Filled 2021-04-19: qty 5

## 2021-04-19 MED ORDER — CHLORHEXIDINE GLUCONATE CLOTH 2 % EX PADS
6.0000 | MEDICATED_PAD | Freq: Every day | CUTANEOUS | Status: DC
  Administered 2021-04-19 – 2021-04-25 (×6): 6 via TOPICAL

## 2021-04-19 MED ORDER — LIDOCAINE-PRILOCAINE 2.5-2.5 % EX CREA
1.0000 "application " | TOPICAL_CREAM | CUTANEOUS | Status: DC | PRN
  Filled 2021-04-19: qty 5

## 2021-04-19 MED ORDER — ALUM & MAG HYDROXIDE-SIMETH 200-200-20 MG/5ML PO SUSP
15.0000 mL | Freq: Four times a day (QID) | ORAL | Status: DC | PRN
  Administered 2021-04-19 – 2021-04-26 (×3): 15 mL via ORAL
  Filled 2021-04-19 (×4): qty 30

## 2021-04-19 NOTE — Progress Notes (Signed)
Pt weaned off Heated High Flow nasal cannula and placed on 8L [place] High Flow nasal cannula at this time. Pt tolerated well, no distress noted. RT will continue to monitor and be available as needed.  ?

## 2021-04-19 NOTE — Progress Notes (Signed)
Occupational Therapy Treatment ?Patient Details ?Name: [name] ?MRN: [id number] ?DOB: 07/18/1945 ?Today's Date: 04/19/2021 ? ? ?History of present illness Pt is a 76 y.o. female admitted 04/03/21 after fall at home when leg gave out sustaining L displaced intertrochanteric hip fx. S/p L hip cephalomedullary nail and closed management of superior pubic ramus fx 2/23. On 3/3, pt with worsening respiratory status and encephalopathy, transferred to ICU and started on BiPAP. PMH includes afib, CAD, CHF, HTN, pacemaker, asthma, DM2, stroke, OSA, cervical sx, back sx, R ankle fx. ?  ?OT comments ? Pt making slow, steady progress towards acute OT goals. Pt c/o R flank/back pain>operative site pain this morning. Utilized Hoyer lift to chair then completed seated exercises working on core muscles to build activity tolerance and strength towards ADL goals. D/c recommendation remains appropriate.  ? ?Recommendations for follow up therapy are one component of a multi-disciplinary discharge planning process, led by the attending physician.  Recommendations may be updated based on patient status, additional functional criteria and insurance authorization. ?   ?Follow Up Recommendations ? Skilled nursing-short term rehab (<3 hours/day)  ?  ?Assistance Recommended at Discharge Frequent or constant Supervision/Assistance  ?Patient can return home with the following ? Two people to help with walking and/or transfers;Two people to help with bathing/dressing/bathroom;Assist for transportation;Help with stairs or ramp for entrance;Assistance with cooking/housework ?  ?Equipment Recommendations ? None recommended by OT;Other (comment)  ?  ?Recommendations for Other Services   ? ?  ?Precautions / Restrictions Precautions ?Precautions: Fall;Other (comment) ?Precaution Comments: Wears 3L O2 baseline, on HHFNC ?Restrictions ?Weight Bearing Restrictions: Yes ?LLE Weight Bearing: Weight bearing as tolerated  ? ? ?  ? ?Mobility Bed  Mobility ?Overal bed mobility: Needs Assistance ?  ?  ?  ?  ?  ?  ?General bed mobility comments: able to move RLE across bed. assist for all other aspects. Sequencing cues. ?  ? ?Transfers ?Overall transfer level: Needs assistance ?  ?  ?  ?  ?  ?  ?  ?  ?General transfer comment: back pain and no bariatric Stedy available, used Hoyer lift  to recliner. ?  ?  ?Balance Overall balance assessment: Needs assistance ?Sitting-balance support: Feet supported, Bilateral upper extremity supported ?Sitting balance-Leahy Scale: Fair ?Sitting balance - Comments: able to pull trunk forward in seated position with no physical assist ?  ?  ?  ?  ?  ?  ?  ?  ?  ?  ?  ?  ?  ?  ?  ?   ? ?ADL either performed or assessed with clinical judgement  ? ?ADL Overall ADL's : Needs assistance/impaired ?  ?  ?  ?  ?  ?  ?  ?  ?  ?  ?  ?  ?  ?  ?  ?  ?  ?  ?  ?General ADL Comments: Back pain and no bariatric Stedy available limiting session. Utilized Hoyer lift to recliner then completed seated trunk exercises. Encouraged pt in seated exercise and being OOB to chair to work on building activity tolerance towards ADL goals. ?  ? ?Extremity/Trunk Assessment Upper Extremity Assessment ?Upper Extremity Assessment: Generalized weakness;RUE deficits/detail;LUE deficits/detail ?RUE Deficits / Details: Pt has WFL but decrease in coordination and increase in edema in BUE. ?RUE Coordination: decreased fine motor ?LUE Deficits / Details: Pt noted decrease in coordination of movements and edema ?LUE Coordination: decreased fine motor ?  ?Lower Extremity Assessment ?Lower Extremity Assessment: Defer to PT  evaluation ?  ?  ?  ? ?Vision   ?  ?  ?Perception   ?  ?Praxis   ?  ? ?Cognition Arousal/Alertness: Awake/alert ?Behavior During Therapy: Flat affect ?Overall Cognitive Status: Impaired/Different from baseline ?Area of Impairment: Following commands, Safety/judgement, Awareness, Problem solving ?  ?  ?  ?  ?  ?  ?  ?  ?  ?  ?  ?Following Commands:  Follows one step commands with increased time, Follows multi-step commands with increased time ?Safety/Judgement: Decreased awareness of safety, Decreased awareness of deficits ?Awareness: Intellectual ?Problem Solving: Slow processing, Decreased initiation, Difficulty sequencing, Requires verbal cues, Requires tactile cues ?  ?  ?  ?   ?Exercises   ? ?  ?Shoulder Instructions   ? ? ?  ?General Comments VSS  ? ? ?Pertinent Vitals/ Pain       Pain Assessment ?Pain Assessment: Faces ?Faces Pain Scale: Hurts even more ?Pain Location: chronic back pain ?Pain Descriptors / Indicators: Discomfort, Grimacing, Guarding ?Pain Intervention(s): Monitored during session, Repositioned, Limited activity within patient's tolerance ? ?Home Living   ?  ?  ?  ?  ?  ?  ?  ?  ?  ?  ?  ?  ?  ?  ?  ?  ?  ?  ? ?  ?Prior Functioning/Environment    ?  ?  ?  ?   ? ?Frequency ? Min 2X/week  ? ? ? ? ?  ?Progress Toward Goals ? ?OT Goals(current goals can now be found in the care plan section) ? Progress towards OT goals: Progressing toward goals ? ?Acute Rehab OT Goals ?Patient Stated Goal: be up in recliner during the day ?OT Goal Formulation: With patient ?Time For Goal Achievement: 05/03/21 ?Potential to Achieve Goals: Fair ?ADL Goals ?Pt Will Perform Grooming: sitting;with set-up ?Pt Will Perform Upper Body Dressing: with min assist;sitting ?Pt Will Perform Lower Body Dressing: with adaptive equipment;with mod assist;sitting/lateral leans ?Pt Will Transfer to Toilet: with mod assist;with +2 assist;stand pivot transfer;bedside commode  ?Plan Discharge plan remains appropriate   ? ?Co-evaluation ? ? ?   ?  ?  ?  ?  ? ?  ?AM-PAC OT "6 Clicks" Daily Activity     ?Outcome Measure ? ? Help from another person eating meals?: A Little ?Help from another person taking care of personal grooming?: A Little ?Help from another person toileting, which includes using toliet, bedpan, or urinal?: Total ?Help from another person bathing (including washing,  rinsing, drying)?: A Lot ?Help from another person to put on and taking off regular upper body clothing?: A Lot ?Help from another person to put on and taking off regular lower body clothing?: Total ?6 Click Score: 12 ? ?  ?End of Session Equipment Utilized During Treatment: Oxygen ? ?OT Visit Diagnosis: Unsteadiness on feet (R26.81);Other abnormalities of gait and mobility (R26.89);Muscle weakness (generalized) (M62.81) ?  ?Activity Tolerance Patient limited by pain;Patient limited by fatigue ?  ?Patient Left in chair;with call bell/phone within reach ?  ?Nurse Communication Mobility status;Need for lift equipment;Other (comment) (back pain, nurse aware) ?  ? ?   ? ?Time: 1104-1130 ?OT Time Calculation (min): 26 min ? ?Charges: OT General Charges ?$OT Visit: 1 Visit ?OT Treatments ?$Self Care/Home Management : 23-37 mins ? ?Tyrone Schimke, OT ?Acute Rehabilitation Services ?Office: [phone] ? ? ?Tyrone Schimke H ?04/19/2021, 12:05 PM ?

## 2021-04-19 NOTE — Progress Notes (Signed)
Heart PROGRESS NOTE    [name]  DVV:[id number] DOB: 06-15-1945 DOA: 04/03/2021 PCP: Lujean Amel, MD   Brief Narrative:  The patient is 76 year old obese female with known OSA OHS O2 dependent at 3 L nasal cannula who fell and suffered a fracture left femur.  She developed worsening renal failure while in the hospital required a HD catheter that was tunneled and placed on 04/11/2021 per interventional radiology.  On 04/12/2021 she had worsening respiratory failure she was obtunded and was transferred to intensive care unit for further evaluation and treatment by pulmonary critical care.    Significant Events:   04/12/2021 transferred intensive care unit; CRRT started 3/4: improved mentation  3/5: CRRT stopped 3/6: no UOP, increasing creatinine 3/7: tolerated iHD 3/8: d/c a-line. Transfer to progressive. 3/9 [facility] assumed care and Dialysis to be attempted again and continues to have poor UOP 3/10 she was able to be weaned to 8 L salter high flow nasal cannula with no respiratory distress; next dialysis session is tomorrow     Assessment and Plan: * Closed left hip fracture ([place]) secondary to fall at home -Patient presents after having a fall found to have a left hip and left superior pubic ramus fracture.  Orthopedics consulted with plans to possibly take patient to the operating room in a.m. based off the geriatric perioperative cardiac risk assessment score patient has 16.1% probability of perioperative cardiac arrest or myocardial infarction. -Admitted to a surgical telemetry bed -Hip fracture order set utilized and now s/p ORIF -Hydrocodone/morphine IV as needed pain -Transitions of care consulted    Hypokalemia -Improved. K+ is now 3.7 -Continue to monitor and adjust doses as needed  Fall at home, initial encounter -Resulted in Hip Fracture and she is s/p ORIF  Chronic kidney disease, stage III B (moderate) ([place]) -On admission creatinine 1.69 which appears slightly above  baseline which appears to be 1.3-1.5. -Went into Renal Failure and now had  Acute on chronic renal failure with anuria -Did not tolerate iHD due to hypotension so required CRRT 3/3-3/5.  Tolerated iHD 3/7 with net UF 2L and also 3/9 Primary management per nephrology Next HD planned 3/11 Continue Midodrine 10 mg po TID   Closed fracture of left superior pubic ramus ([place]) -Secondary to fall.  No surgical intervention recommended for treatment of this fracture.  Thrombocytopenia ([place]) -Improved. Platelet Count is now 178 -Continue to Monitor for S/Sx of Bleeding; no overt bleeding noted -Repeat CBC in the AM   Respiratory failure (HCC) -Acute on Chronic Hypoxic/Hypercapneic Failure  -Patient has history of obesity hypoventilation syndrome and OSA.  She is chronically on 3 L of oxygen at baseline. -SpO2: 96 % O2 Flow Rate (L/min): (S) 8 L/min FiO2 (%): (S) 35 % -Suspect this will continue to improve with volume removal from HD  OSA/OHS Wean HHF for O2 saturations 88-92% BiPAP qHS Will need home BiPAP set up prior to discharge  Diabetes mellitus without complication (HCC) Last hemoglobin A1c was 6.5 less than 3 months ago.  Patient is on Jardiance 10 mg daily. -Hypoglycemic protocols -Held Jardiance -CBGs before every meal with sensitive SSI -Adjust insulin regimen as needed -CBGs ranging from 117-200 now  CHB (complete heart block) (HCC) S/p PPM/ICD   Chronic diastolic CHF (congestive heart failure) (HCC) -Chronic.  Last echocardiogram noted EF of 55 to 60% with grade 1 diastolic dysfunction in 07/2692.  BNP improved to 333.8 from 712 on 2/6.  Patient has some mild lower extremity edema but does not  appear grossly fluid overloaded.  She had been seen by her cardiologist Dr. Burt Knack on 2/20 and recommended to increase torsemide to 80 mg daily. -Strict I&Os and daily weights -Continued current dose of Torsemide but now held due to the patient getting Dialysis   S/P CABG x  5 -Last cardiac catheter was in 02/2019 where all territories were noted to have adequate coronary flow via bypasses or native vessels with stents. -Continue aspirin, Plavix, and Crestor -BB and Isororbide held due to Hypotension earlier but may resume slowly  Mixed hypercholesterolemia and hypertriglyceridemia -Continue Crestor  HTN (hypertension), benign Home medication regimen includes bisoprolol 7.5 mg daily, Imdur 90 mg daily, torsemide 80 mg daily which were held due to her softer BP -GDMT for Chronic Diastolic Heart Failure held due to Hypotension and will resume as Able   ANEMIA -Hemoglobin 10.8 g/dL which appears around lower end of patient's baseline which ranges from 10- 12 g/dL. -Now Hgb/Hct dropped to 8.7/27.1 yesterday and today is improved to 9.5/30.1  -Continue to monitor H&H and continue to Monitor for S/Sx of Bleeding -Repeat CBC in the AM  Adrenal insufficiency Continue home dose of prednisone '10mg'$    LLE swelling.  3/4 DVT study negative.     DVT prophylaxis: heparin injection 5,000 Units Start: 04/15/21 1400    Code Status: Full Code Family Communication:   Disposition Plan:  Level of care: Progressive Status is: Inpatient Remains inpatient appropriate because: Needs improvement in O2   Consultants:  PCCM Transfer Cardiology Nephrology Orthopedic Surgery  Procedures:  As above  Antimicrobials:  Anti-infectives (From admission, onward)    Start     Dose/Rate Route Frequency Ordered Stop   04/11/21 1530  vancomycin (VANCOCIN) IVPB 1000 mg/200 mL premix        1,000 mg 200 mL/hr over 60 Minutes Intravenous  Once 04/11/21 1432 04/12/21 1952   04/04/21 1423  vancomycin (VANCOCIN) powder  Status:  Discontinued          As needed 04/04/21 1424 04/04/21 1551   04/04/21 1200  vancomycin (VANCOREADY) IVPB 1500 mg/300 mL        1,500 mg 150 mL/hr over 120 Minutes Intravenous On call to O.R. 04/03/21 1241 04/04/21 1529   04/04/21 1145  ceFAZolin (ANCEF)  IVPB 2g/100 mL premix  Status:  Discontinued        2 g 200 mL/hr over 30 Minutes Intravenous On call to O.R. 04/04/21 4196 04/04/21 1717        Subjective: Seen and examined at bedside and states that she had some in.  No chest pain and thinks her shortness of breath getting better.  Still having some slight abdominal discomfort.  No other concerns or plans at this time.  Objective: Vitals:   04/19/21 1518 04/19/21 1520 04/19/21 1833 04/19/21 2008  BP:   (!) 155/60 111/78  Pulse:   87 99  Resp:   20 19  Temp: 97.7 F (36.5 C)  98.1 F (36.7 C) 97.6 F (36.4 C)  TempSrc: Oral  Oral Oral  SpO2:  95% 98% 96%  Weight:      Height:        Intake/Output Summary (Last 24 hours) at 04/19/2021 2115 Last data filed at 04/19/2021 1600 Gross per 24 hour  Intake 860 ml  Output 3000 ml  Net -2140 ml   Filed Weights   04/18/21 0323 04/18/21 1840 04/19/21 0500  Weight: 99.2 kg (P) 99.2 kg 97.1 kg   Examination: Physical Exam:  Constitutional: WN/WD  obese Caucasian female currently in no acute distress Respiratory: Diminished to auscultation bilaterally with coarse breath sounds, no wheezing, rales, rhonchi or crackles. Normal respiratory effort and patient is not tachypenic.  Wearing heated high flow nasal cannula today Cardiovascular: RRR, no murmurs / rubs / gallops. S1 and S2 auscultated.  Has slight 1+ lower extremity edema Abdomen: Soft, mildly-tender, distended secondary body habitus.  Bowel sounds positive.  GU: Deferred. Musculoskeletal: No clubbing / cyanosis of digits/nails. No joint deformity upper and lower extremities.   Data Reviewed: I have personally reviewed following labs and imaging studies  CBC: Recent Labs  Lab 04/13/21 0949 04/13/21 1134 04/14/21 0317 04/14/21 1225 04/14/21 1833 04/15/21 1726 04/18/21 1002 04/19/21 0533 04/19/21 2100  WBC 15.3*  --  15.5*  --   --   --  10.8* 11.2* 8.7  NEUTROABS 13.7*  --  13.9*  --   --   --  8.9* 9.4*  --   HGB  8.9*   < > 8.5*   < > 9.9* 8.8* 8.7* 9.5* 9.0*  HCT 29.5*   < > 28.2*   < > 29.0* 26.0* 27.1* 30.1* 29.6*  MCV 95.5  --  95.6  --   --   --  93.1 93.2 95.2  PLT 208  --  224  --   --   --  177 178 174   < > = values in this interval not displayed.   Basic Metabolic Panel: Recent Labs  Lab 04/15/21 0320 04/15/21 1726 04/16/21 0352 04/16/21 1534 04/17/21 0414 04/18/21 0315 04/19/21 0533  NA  --    < > 132* 130* 133* 132* 133*  K  --    < > 4.5 5.1 3.5 4.0 3.7  CL  --   --  98 97* 98 94* 96*  CO2  --   --  '23 23 26 25 '$ 21*  GLUCOSE  --   --  109* 193* 127* 119* 122*  BUN  --   --  75* 90* 43* 69* 43*  CREATININE  --   --  3.58* 4.14* 2.57* 3.67* 2.86*  CALCIUM  --   --  9.2 9.2 8.9 9.1 8.6*  MG 2.7*  --  2.8*  --  2.2 2.3 2.1  PHOS  --   --  3.3 3.5 2.1* 3.3 3.2   < > = values in this interval not displayed.   GFR: Estimated Creatinine Clearance: 18.3 mL/min (A) (by C-G formula based on SCr of 2.86 mg/dL (H)). Liver Function Tests: Recent Labs  Lab 04/16/21 1534 04/17/21 0414 04/18/21 0315 04/18/21 0331 04/19/21 0533  AST  --   --   --  22 26  ALT  --   --   --  23 22  ALKPHOS  --   --   --  136* 150*  BILITOT  --   --   --  0.7 1.0  PROT  --   --   --  6.4* 6.8  ALBUMIN 1.8* 2.0* 2.0* 2.0* 2.2*   No results for input(s): LIPASE, AMYLASE in the last 168 hours. No results for input(s): AMMONIA in the last 168 hours. Coagulation Profile: No results for input(s): INR, PROTIME in the last 168 hours. Cardiac Enzymes: No results for input(s): CKTOTAL, CKMB, CKMBINDEX, TROPONINI in the last 168 hours. BNP (last 3 results) No results for input(s): PROBNP in the last 8760 hours. HbA1C: No results for input(s): HGBA1C in the last 72 hours. CBG: Recent Labs  Lab 04/18/21 2315 04/19/21 0317 04/19/21 0721 04/19/21 1118 04/19/21 1720  GLUCAP 155* 129* 117* 200* 122*   Lipid Profile: No results for input(s): CHOL, HDL, LDLCALC, TRIG, CHOLHDL, LDLDIRECT in the last 72  hours. Thyroid Function Tests: No results for input(s): TSH, T4TOTAL, FREET4, T3FREE, THYROIDAB in the last 72 hours. Anemia Panel: No results for input(s): VITAMINB12, FOLATE, FERRITIN, TIBC, IRON, RETICCTPCT in the last 72 hours. Sepsis Labs: No results for input(s): PROCALCITON, LATICACIDVEN in the last 168 hours.  No results found for this or any previous visit (from the past 240 hour(s)).   Radiology Studies: DG CHEST PORT 1 VIEW  Result Date: 04/19/2021 CLINICAL DATA:  Shortness of breath, evaluate congestion. EXAM: PORTABLE CHEST 1 VIEW COMPARISON:  Radiographs dated April 17, 2021 FINDINGS: The heart is enlarged. Atherosclerotic calcification of aortic arch. Evidence of prior CABG. Mild bibasilar atelectasis with possible small left pleural effusion, unchanged. Left access dialysis catheter is unchanged. Right-sided multi lead pacing device is also unchanged. No acute osseous abnormality. IMPRESSION: 1. Stable cardiomegaly with mild bibasilar atelectasis and possible small left effusion, unchanged. 2. Left access dialysis catheter with distal tip in the SVC. Electronically Signed   By: Keane Police D.O.   On: 04/19/2021 08:10     Scheduled Meds:  allopurinol  100 mg Oral Daily   aspirin  81 mg Oral Daily   Chlorhexidine Gluconate Cloth  6 each Topical Q0600   Chlorhexidine Gluconate Cloth  6 each Topical Q0600   clopidogrel  75 mg Oral Daily   cycloSPORINE  1 drop Both Eyes BID   heparin injection (subcutaneous)  5,000 Units Subcutaneous Q8H   insulin aspart  0-6 Units Subcutaneous TID WC   mouth rinse  15 mL Mouth Rinse q12n4p   midodrine  10 mg Oral 3 times per day on Tue Thu Sat   mometasone-formoterol  2 puff Inhalation BID   pantoprazole sodium  40 mg Oral BID   polyethylene glycol  17 g Oral Daily   polyvinyl alcohol  1 drop Both Eyes Q12H   predniSONE  10 mg Oral Q breakfast   rosuvastatin  5 mg Oral QPM   senna  1 tablet Oral BID   sodium chloride flush  10-40 mL  Intracatheter Q12H   Continuous Infusions:  sodium chloride     sodium chloride     sodium chloride      LOS: 16 days   [name], DO [facility] Available via Epic secure chat 7am-7pm After these hours, please refer to coverage provider listed on [email or web address] 04/19/2021, 9:15 PM

## 2021-04-19 NOTE — Progress Notes (Signed)
[facility] ROUNDING NOTE   Subjective:   Interval History: 76 year old lady with stage IIIb chronic kidney disease.  Admitted 04/03/2021 with  fall and hip fracture.  Low blood pressures postoperatively suspecting acute tubular necrosis.  Dialysis initiated 04/11/2021 with transition to CRRT 04/12/2021 with BiPAP dependent respiratory failure.  Previous history includes a history of CAD status post CABG. CRRT discontinued on 04/14/2021.   Transition to intermittent hemodialysis.  Tolerated 3 L removed 04/18/2021 next dialysis 04/20/2021  Blood pressure 130/68 pulse 84 temperature 97.7 O2 sats 94% high flow nasal cannula  Sodium 133 potassium 3.7 chloride 96 CO2 21 BUN 43 creatinine 2.86 glucose 122 calcium 8.6 phosphorus 3.2 magnesium 2.1 albumin 2.2 hemoglobin 9.5   Objective:  Vital signs in last 24 hours:  Temp:  [97.1 F (36.2 C)-98.2 F (36.8 C)] 97.7 F (36.5 C) (03/10 0723) Pulse Rate:  [65-107] 77 (03/10 0335) Resp:  [14-31] 26 (03/10 0335) BP: (88-153)/(52-127) 102/65 (03/10 0335) SpO2:  [87 %-99 %] 96 % (03/10 0000) FiO2 (%):  [30 %-40 %] 40 % (03/10 0335) Weight:  [97.1 kg-99.2 kg] 97.1 kg (03/10 0500)  Weight change: -2.1 kg Filed Weights   04/18/21 0323 04/18/21 1840 04/19/21 0500  Weight: 99.2 kg (P) 99.2 kg 97.1 kg    Intake/Output: I/O last 3 completed shifts: In: 720 [P.O.:720] Out: 3000 [Other:3000]   Intake/Output this shift:  No intake/output data recorded.  Gen obese, somnolent, deconditioned, on BiPAP RRR no RG Abd soft obese ntnd no mass or ascites +bs MS no joint effusions or deformity Ext trace LE/ UE edema IJ [facility] noted   Basic Metabolic Panel: Recent Labs  Lab 04/15/21 0320 04/15/21 1726 04/16/21 0352 04/16/21 1534 04/17/21 0414 04/18/21 0315 04/19/21 0533  NA  --    < > 132* 130* 133* 132* 133*  K  --    < > 4.5 5.1 3.5 4.0 3.7  CL  --   --  98 97* 98 94* 96*  CO2  --   --  '23 23 26 25 '$ 21*  GLUCOSE  --   --  109* 193* 127*  119* 122*  BUN  --   --  75* 90* 43* 69* 43*  CREATININE  --   --  3.58* 4.14* 2.57* 3.67* 2.86*  CALCIUM  --   --  9.2 9.2 8.9 9.1 8.6*  MG 2.7*  --  2.8*  --  2.2 2.3 2.1  PHOS  --   --  3.3 3.5 2.1* 3.3 3.2   < > = values in this interval not displayed.     Liver Function Tests: Recent Labs  Lab 04/16/21 1534 04/17/21 0414 04/18/21 0315 04/18/21 0331 04/19/21 0533  AST  --   --   --  22 26  ALT  --   --   --  23 22  ALKPHOS  --   --   --  136* 150*  BILITOT  --   --   --  0.7 1.0  PROT  --   --   --  6.4* 6.8  ALBUMIN 1.8* 2.0* 2.0* 2.0* 2.2*    No results for input(s): LIPASE, AMYLASE in the last 168 hours. No results for input(s): AMMONIA in the last 168 hours.  CBC: Recent Labs  Lab 04/13/21 0949 04/13/21 1134 04/14/21 0317 04/14/21 1225 04/14/21 1833 04/15/21 1726 04/18/21 1002 04/19/21 0533  WBC 15.3*  --  15.5*  --   --   --  10.8* 11.2*  NEUTROABS 13.7*  --  13.9*  --   --   --  8.9* 9.4*  HGB 8.9*   < > 8.5* 9.9* 9.9* 8.8* 8.7* 9.5*  HCT 29.5*   < > 28.2* 29.0* 29.0* 26.0* 27.1* 30.1*  MCV 95.5  --  95.6  --   --   --  93.1 93.2  PLT 208  --  224  --   --   --  177 178   < > = values in this interval not displayed.     Cardiac Enzymes: No results for input(s): CKTOTAL, CKMB, CKMBINDEX, TROPONINI in the last 168 hours.  BNP: Invalid input(s): POCBNP  CBG: Recent Labs  Lab 04/18/21 1516 04/18/21 1942 04/18/21 2315 04/19/21 0317 04/19/21 0721  GLUCAP 175* 164* 155* 129* 117*     Microbiology: Results for orders placed or performed during the hospital encounter of 04/03/21  Resp Panel by RT-PCR (Flu A&B, Covid) Nasopharyngeal Swab     Status: None   Collection Time: 04/03/21  9:40 AM   Specimen: Nasopharyngeal Swab; Nasopharyngeal(NP) swabs in vial transport medium  Result Value Ref Range Status   SARS Coronavirus 2 by RT PCR NEGATIVE NEGATIVE Final    Comment: (NOTE) SARS-CoV-2 target nucleic acids are NOT DETECTED.  The SARS-CoV-2  RNA is generally detectable in upper respiratory specimens during the acute phase of infection. The lowest concentration of SARS-CoV-2 viral copies this assay can detect is 138 copies/mL. A negative result does not preclude SARS-Cov-2 infection and should not be used as the sole basis for treatment or other patient management decisions. A negative result may occur with  improper specimen collection/handling, submission of specimen other than nasopharyngeal swab, presence of viral mutation(s) within the areas targeted by this assay, and inadequate number of viral copies(<138 copies/mL). A negative result must be combined with clinical observations, patient history, and epidemiological information. The expected result is Negative.  Fact Sheet for Patients:  EntrepreneurPulse.com.au  Fact Sheet for Healthcare Providers:  IncredibleEmployment.be  This test is no t yet approved or cleared by the [place] FDA and  has been authorized for detection and/or diagnosis of SARS-CoV-2 by FDA under an Emergency Use Authorization (EUA). This EUA will remain  in effect (meaning this test can be used) for the duration of the COVID-19 declaration under Section 564(b)(1) of the Act, 21 U.S.C.section 360bbb-3(b)(1), unless the authorization is terminated  or revoked sooner.       Influenza A by PCR NEGATIVE NEGATIVE Final   Influenza B by PCR NEGATIVE NEGATIVE Final    Comment: (NOTE) The Xpert Xpress SARS-CoV-2/FLU/RSV plus assay is intended as an aid in the diagnosis of influenza from Nasopharyngeal swab specimens and should not be used as a sole basis for treatment. Nasal washings and aspirates are unacceptable for Xpert Xpress SARS-CoV-2/FLU/RSV testing.  Fact Sheet for Patients: EntrepreneurPulse.com.au  Fact Sheet for Healthcare Providers: IncredibleEmployment.be  This test is not yet approved or cleared by the  [place] FDA and has been authorized for detection and/or diagnosis of SARS-CoV-2 by FDA under an Emergency Use Authorization (EUA). This EUA will remain in effect (meaning this test can be used) for the duration of the COVID-19 declaration under Section 564(b)(1) of the Act, 21 U.S.C. section 360bbb-3(b)(1), unless the authorization is terminated or revoked.  Performed at [facility], [place] [address], [place], [place] [address]   Surgical PCR Screen     Status: None   Collection Time: 04/04/21  3:45 PM  Specimen: Nasal Mucosa; Nasal Swab  Result Value Ref Range Status   MRSA, PCR NEGATIVE NEGATIVE Final   Staphylococcus aureus NEGATIVE NEGATIVE Final    Comment: (NOTE) The Xpert SA Assay (FDA approved for NASAL specimens in patients 16 years of age and older), is one component of a comprehensive surveillance program. It is not intended to diagnose infection nor to guide or monitor treatment. Performed at [facility], [place] [address], [place], [place] [address]    *Note: Due to a large number of results and/or encounters for the requested time period, some results have not been displayed. A complete set of results can be found in Results Review.    Coagulation Studies: No results for input(s): LABPROT, INR in the last 72 hours.  Urinalysis: No results for input(s): COLORURINE, LABSPEC, PHURINE, GLUCOSEU, HGBUR, BILIRUBINUR, KETONESUR, PROTEINUR, UROBILINOGEN, NITRITE, LEUKOCYTESUR in the last 72 hours.  Invalid input(s): APPERANCEUR    Imaging: DG CHEST PORT 1 VIEW  Result Date: 04/17/2021 CLINICAL DATA:  Shortness of breath EXAM: PORTABLE CHEST 1 VIEW COMPARISON:  04/12/2021, 01/07/2021 FINDINGS: Post sternotomy changes. Left-sided central venous catheter tip over the right atrium. Right-sided multi lead pacing device. Borderline cardiomegaly with central vascular congestion and probable small effusions. Subsegmental atelectasis left base. Aortic  atherosclerosis. No pneumothorax IMPRESSION: 1. Cardiomegaly with mild central congestion and probable small pleural effusions 2. Subsegmental atelectasis at the left base Electronically Signed   By: Donavan Foil M.D.   On: 04/17/2021 17:57     Medications:    sodium chloride      allopurinol  100 mg Oral Daily   aspirin  81 mg Oral Daily   Chlorhexidine Gluconate Cloth  6 each Topical Q0600   clopidogrel  75 mg Oral Daily   cycloSPORINE  1 drop Both Eyes BID   heparin injection (subcutaneous)  5,000 Units Subcutaneous Q8H   heparin sodium (porcine)       insulin aspart  0-6 Units Subcutaneous TID WC   mouth rinse  15 mL Mouth Rinse q12n4p   midodrine  10 mg Oral 3 times per day on Tue Thu Sat   mometasone-formoterol  2 puff Inhalation BID   pantoprazole sodium  40 mg Oral BID   polyethylene glycol  17 g Oral Daily   polyvinyl alcohol  1 drop Both Eyes Q12H   predniSONE  10 mg Oral Q breakfast   rosuvastatin  5 mg Oral QPM   senna  1 tablet Oral BID   sodium chloride flush  10-40 mL Intracatheter Q12H   acetaminophen, albuterol, bisacodyl, guaiFENesin, hydrALAZINE, HYDROcodone-acetaminophen, melatonin, ondansetron (ZOFRAN) IV, simethicone, sodium chloride, sodium chloride flush  Assessment/ Plan:  Acute on chronic kidney disease.  Baseline creatinine about 1.3 mg/dL.  Status post hip fracture now with hypotension and respiratory distress.  Patient placed on CRRT 04/12/2021 This was discontinued on 04/14/2021.  Appears to have poor urine output.  Hemodialysis requested although historically did not tolerate.  She continues a full CODE STATUS.  She appeared to tolerate dialysis 04/18/2021.  Next dialysis will be 04/20/2021 ANEMIA-no ESA's at this point. MBD-appears to be stable at this point. HTN/VOL-  She has little urine output.  She appears to be tolerating dialysis this point Status post hip fracture at 04/03/2021    LOS: [place] '@TODAY''@7'$ :36 AM

## 2021-04-19 NOTE — Progress Notes (Signed)
Pt placed on bipap for bed. RT will cont to monitor.  °

## 2021-04-20 ENCOUNTER — Inpatient Hospital Stay (HOSPITAL_COMMUNITY): Payer: Medicare Other

## 2021-04-20 DIAGNOSIS — I442 Atrioventricular block, complete: Secondary | ICD-10-CM | POA: Diagnosis not present

## 2021-04-20 DIAGNOSIS — W19XXXA Unspecified fall, initial encounter: Secondary | ICD-10-CM | POA: Diagnosis not present

## 2021-04-20 DIAGNOSIS — I5032 Chronic diastolic (congestive) heart failure: Secondary | ICD-10-CM | POA: Diagnosis not present

## 2021-04-20 DIAGNOSIS — I10 Essential (primary) hypertension: Secondary | ICD-10-CM | POA: Diagnosis not present

## 2021-04-20 LAB — CBC WITH DIFFERENTIAL/PLATELET
Abs Immature Granulocytes: 0.18 10*3/uL — ABNORMAL HIGH (ref 0.00–0.07)
Basophils Absolute: 0 10*3/uL (ref 0.0–0.1)
Basophils Relative: 0 %
Eosinophils Absolute: 0.1 10*3/uL (ref 0.0–0.5)
Eosinophils Relative: 1 %
HCT: 27.5 % — ABNORMAL LOW (ref 36.0–46.0)
Hemoglobin: 8.6 g/dL — ABNORMAL LOW (ref 12.0–15.0)
Immature Granulocytes: 2 %
Lymphocytes Relative: 10 %
Lymphs Abs: 1 10*3/uL (ref 0.7–4.0)
MCH: 29.7 pg (ref 26.0–34.0)
MCHC: 31.3 g/dL (ref 30.0–36.0)
MCV: 94.8 fL (ref 80.0–100.0)
Monocytes Absolute: 0.8 10*3/uL (ref 0.1–1.0)
Monocytes Relative: 9 %
Neutro Abs: 7.5 10*3/uL (ref 1.7–7.7)
Neutrophils Relative %: 78 %
Platelets: 167 10*3/uL (ref 150–400)
RBC: 2.9 MIL/uL — ABNORMAL LOW (ref 3.87–5.11)
RDW: 17 % — ABNORMAL HIGH (ref 11.5–15.5)
WBC: 9.5 10*3/uL (ref 4.0–10.5)
nRBC: 0.5 % — ABNORMAL HIGH (ref 0.0–0.2)

## 2021-04-20 LAB — COMPREHENSIVE METABOLIC PANEL
ALT: 23 U/L (ref 0–44)
AST: 25 U/L (ref 15–41)
Albumin: 2.2 g/dL — ABNORMAL LOW (ref 3.5–5.0)
Alkaline Phosphatase: 142 U/L — ABNORMAL HIGH (ref 38–126)
Anion gap: 13 (ref 5–15)
BUN: 67 mg/dL — ABNORMAL HIGH (ref 8–23)
CO2: 26 mmol/L (ref 22–32)
Calcium: 8.8 mg/dL — ABNORMAL LOW (ref 8.9–10.3)
Chloride: 94 mmol/L — ABNORMAL LOW (ref 98–111)
Creatinine, Ser: 4.14 mg/dL — ABNORMAL HIGH (ref 0.44–1.00)
GFR, Estimated: 11 mL/min — ABNORMAL LOW (ref >60)
Glucose, Bld: 95 mg/dL (ref 70–99)
Potassium: 4.1 mmol/L (ref 3.5–5.1)
Sodium: 133 mmol/L — ABNORMAL LOW (ref 135–145)
Total Bilirubin: 0.8 mg/dL (ref 0.3–1.2)
Total Protein: 6.7 g/dL (ref 6.5–8.1)

## 2021-04-20 LAB — GLUCOSE, CAPILLARY
Glucose-Capillary: 104 mg/dL — ABNORMAL HIGH (ref 70–99)
Glucose-Capillary: 153 mg/dL — ABNORMAL HIGH (ref 70–99)
Glucose-Capillary: 126 mg/dL — ABNORMAL HIGH (ref 70–99)
Glucose-Capillary: 124 mg/dL — ABNORMAL HIGH (ref 70–99)

## 2021-04-20 LAB — APTT: aPTT: 31 seconds (ref 24–36)

## 2021-04-20 LAB — MAGNESIUM: Magnesium: 2.3 mg/dL (ref 1.7–2.4)

## 2021-04-20 LAB — PHOSPHORUS: Phosphorus: 6.1 mg/dL — ABNORMAL HIGH (ref 2.5–4.6)

## 2021-04-20 MED ORDER — LIDOCAINE-PRILOCAINE 2.5-2.5 % EX CREA: 1.0000 "application " | TOPICAL_CREAM | CUTANEOUS | Status: DC | PRN

## 2021-04-20 MED ORDER — HEPARIN SODIUM (PORCINE) 1000 UNIT/ML DIALYSIS
1000.0000 [IU] | INTRAMUSCULAR | Status: DC | PRN
  Filled 2021-04-20: qty 1

## 2021-04-20 MED ORDER — CALCIUM ACETATE (PHOS BINDER) 667 MG PO CAPS
1334.0000 mg | ORAL_CAPSULE | Freq: Three times a day (TID) | ORAL | Status: DC
  Administered 2021-04-20 – 2021-04-22 (×7): 1334 mg via ORAL
  Filled 2021-04-20 (×8): qty 2

## 2021-04-20 MED ORDER — SODIUM CHLORIDE 0.9 % IV SOLN: 100.0000 mL | INTRAVENOUS | Status: DC | PRN

## 2021-04-20 MED ORDER — ALTEPLASE 2 MG IJ SOLR: 2.0000 mg | Freq: Once | INTRAMUSCULAR | Status: DC | PRN

## 2021-04-20 MED ORDER — LIDOCAINE HCL (PF) 1 % IJ SOLN: 5.0000 mL | INTRAMUSCULAR | Status: DC | PRN

## 2021-04-20 MED ORDER — PENTAFLUOROPROP-TETRAFLUOROETH EX AERO: 1.0000 "application " | INHALATION_SPRAY | CUTANEOUS | Status: DC | PRN

## 2021-04-20 MED ORDER — DARBEPOETIN ALFA 100 MCG/0.5ML IJ SOSY: 100.0000 ug | PREFILLED_SYRINGE | INTRAMUSCULAR | Status: DC

## 2021-04-20 NOTE — Progress Notes (Signed)
Report given to HD ?

## 2021-04-20 NOTE — Progress Notes (Signed)
removed 1518ms net fluid unable to remove more sure to hypotension .,  pre bp 107/50 post bp 114/51 pre weight 97.89kg post weight 96.1kg  gave midodrine as ordered for bp support. .Marland Kitchenand vicodin for pain during treatment catheter ran well packed with heparin clamped and capped.   ?

## 2021-04-20 NOTE — Progress Notes (Addendum)
PROGRESS NOTE    [name]  EHO:[id number] DOB: [birth year] DOA: 04/03/2021 PCP: Lujean Amel, MD   Brief Narrative:  The patient is 76 year old obese female with known OSA OHS O2 dependent at 3 L nasal cannula who fell and suffered a fracture left femur.  She developed worsening renal failure while in the hospital required a HD catheter that was tunneled and placed on 04/11/2021 per interventional radiology.  On 04/12/2021 she had worsening respiratory failure she was obtunded and was transferred to intensive care unit for further evaluation and treatment by pulmonary critical care.    Significant Events:   04/12/2021 transferred intensive care unit; CRRT started 3/4: improved mentation  3/5: CRRT stopped 3/6: no UOP, increasing creatinine 3/7: tolerated iHD 3/8: d/c a-line. Transfer to progressive. 3/9 TRH assumed care and Dialysis to be attempted again and continues to have poor UOP 3/10 she was able to be weaned to 8 L salter high flow nasal cannula with no respiratory distress; next dialysis session is tomorrow 3/11 underwent dialysis today had had 1500 mL removed. Had to have Midodrine for BP support. PT/OT recommending SNF and Ortho recommending staple Removal Monday    Assessment and Plan: * Closed left hip fracture ([place]) secondary to fall at home -Patient presents after having a fall found to have a left hip and left superior pubic ramus fracture.  Orthopedics consulted with plans to possibly take patient to the operating room in a.m. based off the geriatric perioperative cardiac risk assessment score patient has 16.1% probability of perioperative cardiac arrest or myocardial infarction. -Admitted to a surgical telemetry bed -Hip fracture order set utilized and now s/p ORIF -Hydrocodone/morphine IV as needed pain -Transitions of care consulted as pateint will need SNF     Hypokalemia -Improved. K+ is now 4.1 -Continue to monitor and adjust doses as needed  Fall at home,  initial encounter -Resulted in Hip Fracture and she is s/p ORIF  Chronic kidney disease, stage III B (moderate) ([place]) -On admission creatinine 1.69 which appears slightly above baseline which appears to be 1.3-1.5. -Went into Renal Failure and now had  Acute on chronic renal failure with anuria -Did not tolerate iHD due to hypotension so required CRRT 3/3-3/5.  Tolerated iHD 3/7 with net UF 2L and also 3/9 as well as 3/11 where she had 1500 mL removed  Primary management per nephrology HD done today 3/11 Continue Midodrine 10 mg po TID   Closed fracture of left superior pubic ramus ([place]) -Secondary to fall.  No surgical intervention recommended for treatment of this fracture.  Thrombocytopenia ([place]) -Improved. Platelet Count is now stable at 167 -Continue to Monitor for S/Sx of Bleeding; no overt bleeding noted -Repeat CBC in the AM   Respiratory failure (HCC) -Acute on Chronic Hypoxic/Hypercapneic Failure  -Patient has history of obesity hypoventilation syndrome and OSA.  She is chronically on 3 L of oxygen at baseline. -SpO2: 96 % O2 Flow Rate (L/min): 8 L/min FiO2 (%): (S) 35 % -Suspect this will continue to improve with volume removal from HD as she had 1500 mL removed today  OSA/OHS Wean HHF for O2 saturations 88-92% BiPAP qHS Will need home BiPAP set up prior to discharge  Diabetes mellitus without complication (HCC) Last hemoglobin A1c was 6.5 less than 3 months ago.  Patient is on Jardiance 10 mg daily. -Hypoglycemic protocols -Held Jardiance -CBGs before every meal with sensitive SSI -Adjust insulin regimen as needed -CBGs ranging from 104-153 now  CHB (complete heart block) ([place])  S/p PPM/ICD   Chronic diastolic CHF (congestive heart failure) (HCC) -Chronic.  Last echocardiogram noted EF of 55 to 60% with grade 1 diastolic dysfunction in 07/4330.  BNP improved to 333.8 from 712 on 2/6.  Patient has some mild lower extremity edema but does not appear grossly fluid  overloaded.  She had been seen by her cardiologist Dr. Burt Knack on 2/20 and recommended to increase torsemide to 80 mg daily. -Strict I&Os and daily weights -Continued current dose of Torsemide but now held due to the patient getting Dialysis and soft BP  S/P CABG x 5 -Last cardiac catheterization was in 02/2019 where all territories were noted to have adequate coronary flow via bypasses or native vessels with stents. -Continue aspirin, Plavix, and Crestor -BB and Isororbide held due to Hypotension earlier but may resume slowly  Mixed hypercholesterolemia and hypertriglyceridemia -Continue Crestor  HTN (hypertension), benign Home medication regimen includes bisoprolol 7.5 mg daily, Imdur 90 mg daily, torsemide 80 mg daily which were held due to her softer BP -GDMT for Chronic Diastolic Heart Failure held due to Hypotension and will resume as Able but BP still on the soft side    ANEMIA -Hemoglobin 10.8 g/dL which appears around lower end of patient's baseline which ranges from 10- 12 g/dL. -Now Hgb/Hct went from 8.7/27.1 -> 9.5/30.1 -> 9.0/29.6 -> 8.6/27.5  -Continue to monitor H&H and continue to Monitor for S/Sx of Bleeding -Repeat CBC in the AM   DVT prophylaxis: heparin injection 5,000 Units Start: 04/15/21 1400    Code Status: Full Code Family Communication: No family present at bedside   Disposition Plan:  Level of care: Progressive Status is: Inpatient Remains inpatient appropriate because: Needs continued Dialysis and Clipping and needs to go to SNF and have Respiratory Improvement     Consultants:  PCCM Transfer Cardiology Nephrology Orthopedic Surgery  Procedures:  As above  Antimicrobials:  Anti-infectives (From admission, onward)    Start     Dose/Rate Route Frequency Ordered Stop   04/11/21 1530  vancomycin (VANCOCIN) IVPB 1000 mg/200 mL premix        1,000 mg 200 mL/hr over 60 Minutes Intravenous  Once 04/11/21 1432 04/12/21 1952   04/04/21 1423   vancomycin (VANCOCIN) powder  Status:  Discontinued          As needed 04/04/21 1424 04/04/21 1551   04/04/21 1200  vancomycin (VANCOREADY) IVPB 1500 mg/300 mL        1,500 mg 150 mL/hr over 120 Minutes Intravenous On call to O.R. 04/03/21 1241 04/04/21 1529   04/04/21 1145  ceFAZolin (ANCEF) IVPB 2g/100 mL premix  Status:  Discontinued        2 g 200 mL/hr over 30 Minutes Intravenous On call to O.R. 04/04/21 9518 04/04/21 1717       Subjective: Seen and examined in dialysis and she is eating graham crackers and states that she is doing a bit better and not complain of any nausea or vomiting and states her abdominal cramping is improved.  Denies any chest pain or lightheadedness or dizziness.  No other concerns or complaints at this time but continues to feel weak.   Objective: Vitals:   04/20/21 1100 04/20/21 1130 04/20/21 1152 04/20/21 1300  BP: (!) 93/48 101/64 (!) 114/51 (!) 114/53  Pulse: 80 88 83 88  Resp: (!) '23 17 15 19  '$ Temp:    97.7 F (36.5 C)  TempSrc:    Oral  SpO2:  96%    Weight:  96.2 kg   Height:        Intake/Output Summary (Last 24 hours) at 04/20/2021 1903 Last data filed at 04/20/2021 1800 Gross per 24 hour  Intake 480 ml  Output 1461 ml  Net -981 ml   Filed Weights   04/19/21 0500 04/20/21 0810 04/20/21 1152  Weight: 97.1 kg 98 kg 96.2 kg   Examination: Physical Exam:  Constitutional: WN/WD obese chronically ill-appearing Caucasian female currently no acute distress getting dialysis Respiratory: Diminished to auscultation bilaterally with coarse breath sounds and wearing supplemental oxygen via nasal  Cardiovascular: RRR, no murmurs / rubs / gallops. S1 and S2 auscultated.  Mild 1+ lower extremity edema Abdomen: Soft, non-tender, distended secondary body habitus. Bowel sounds positive.  GU: Deferred. Musculoskeletal: No clubbing / cyanosis of digits/nails. No joint deformity upper and lower extremities.  Neurologic: CN 2-12 grossly intact with no  focal deficits.   Data Reviewed: I have personally reviewed following labs and imaging studies  CBC: Recent Labs  Lab 04/14/21 0317 04/14/21 1225 04/15/21 1726 04/18/21 1002 04/19/21 0533 04/19/21 2100 04/20/21 0322  WBC 15.5*  --   --  10.8* 11.2* 8.7 9.5  NEUTROABS 13.9*  --   --  8.9* 9.4*  --  7.5  HGB 8.5*   < > 8.8* 8.7* 9.5* 9.0* 8.6*  HCT 28.2*   < > 26.0* 27.1* 30.1* 29.6* 27.5*  MCV 95.6  --   --  93.1 93.2 95.2 94.8  PLT 224  --   --  177 178 174 167   < > = values in this interval not displayed.   Basic Metabolic Panel: Recent Labs  Lab 04/16/21 0352 04/16/21 1534 04/17/21 0414 04/18/21 0315 04/19/21 0533 04/19/21 2057 04/20/21 0322  NA 132*   < > 133* 132* 133* 134* 133*  K 4.5   < > 3.5 4.0 3.7 4.2 4.1  CL 98   < > 98 94* 96* 95* 94*  CO2 23   < > 26 25 21* 25 26  GLUCOSE 109*   < > 127* 119* 122* 171* 95  BUN 75*   < > 43* 69* 43* 61* 67*  CREATININE 3.58*   < > 2.57* 3.67* 2.86* 3.86* 4.14*  CALCIUM 9.2   < > 8.9 9.1 8.6* 8.8* 8.8*  MG 2.8*  --  2.2 2.3 2.1  --  2.3  PHOS 3.3   < > 2.1* 3.3 3.2 4.8* 6.1*   < > = values in this interval not displayed.   GFR: Estimated Creatinine Clearance: 12.6 mL/min (A) (by C-G formula based on SCr of 4.14 mg/dL (H)). Liver Function Tests: Recent Labs  Lab 04/18/21 0315 04/18/21 0331 04/19/21 0533 04/19/21 2057 04/20/21 0322  AST  --  22 26  --  25  ALT  --  23 22  --  23  ALKPHOS  --  136* 150*  --  142*  BILITOT  --  0.7 1.0  --  0.8  PROT  --  6.4* 6.8  --  6.7  ALBUMIN 2.0* 2.0* 2.2* 2.1* 2.2*   No results for input(s): LIPASE, AMYLASE in the last 168 hours. No results for input(s): AMMONIA in the last 168 hours. Coagulation Profile: No results for input(s): INR, PROTIME in the last 168 hours. Cardiac Enzymes: No results for input(s): CKTOTAL, CKMB, CKMBINDEX, TROPONINI in the last 168 hours. BNP (last 3 results) No results for input(s): PROBNP in the last 8760 hours. HbA1C: No results for  input(s): HGBA1C  in the last 72 hours. CBG: Recent Labs  Lab 04/19/21 1720 04/19/21 2218 04/20/21 0758 04/20/21 1223 04/20/21 1612  GLUCAP 122* 133* 104* 153* 126*   Lipid Profile: No results for input(s): CHOL, HDL, LDLCALC, TRIG, CHOLHDL, LDLDIRECT in the last 72 hours. Thyroid Function Tests: No results for input(s): TSH, T4TOTAL, FREET4, T3FREE, THYROIDAB in the last 72 hours. Anemia Panel: No results for input(s): VITAMINB12, FOLATE, FERRITIN, TIBC, IRON, RETICCTPCT in the last 72 hours. Sepsis Labs: No results for input(s): PROCALCITON, LATICACIDVEN in the last 168 hours.  No results found for this or any previous visit (from the past 240 hour(s)).   Radiology Studies: DG CHEST PORT 1 VIEW  Result Date: 04/20/2021 CLINICAL DATA:  Shortness of breath EXAM: PORTABLE CHEST 1 VIEW COMPARISON:  04/19/2021 and prior radiographs FINDINGS: UPPER limits normal heart size and CABG changes again noted. LEFT IJ central venous catheter and RIGHT pacemaker/AICD again noted. Bibasilar opacities are unchanged and may represent atelectasis. There may be trace bilateral pleural effusions present. There is no evidence of pneumothorax. IMPRESSION: Unchanged appearance of the chest. Unchanged bibasilar opacities which may represent atelectasis. Possible trace bilateral pleural effusions. Electronically Signed   By: Margarette Canada M.D.   On: 04/20/2021 14:24   DG CHEST PORT 1 VIEW  Result Date: 04/19/2021 CLINICAL DATA:  Shortness of breath, evaluate congestion. EXAM: PORTABLE CHEST 1 VIEW COMPARISON:  Radiographs dated April 17, 2021 FINDINGS: The heart is enlarged. Atherosclerotic calcification of aortic arch. Evidence of prior CABG. Mild bibasilar atelectasis with possible small left pleural effusion, unchanged. Left access dialysis catheter is unchanged. Right-sided multi lead pacing device is also unchanged. No acute osseous abnormality. IMPRESSION: 1. Stable cardiomegaly with mild bibasilar  atelectasis and possible small left effusion, unchanged. 2. Left access dialysis catheter with distal tip in the SVC. Electronically Signed   By: Keane Police D.O.   On: 04/19/2021 08:10     Scheduled Meds:  allopurinol  100 mg Oral Daily   aspirin  81 mg Oral Daily   calcium acetate  1,334 mg Oral TID WC   Chlorhexidine Gluconate Cloth  6 each Topical Q0600   Chlorhexidine Gluconate Cloth  6 each Topical Q0600   clopidogrel  75 mg Oral Daily   cycloSPORINE  1 drop Both Eyes BID   darbepoetin (ARANESP) injection - DIALYSIS  100 mcg Intravenous Q Sat-HD   heparin injection (subcutaneous)  5,000 Units Subcutaneous Q8H   insulin aspart  0-6 Units Subcutaneous TID WC   mouth rinse  15 mL Mouth Rinse q12n4p   midodrine  10 mg Oral 3 times per day on Tue Thu Sat   mometasone-formoterol  2 puff Inhalation BID   pantoprazole sodium  40 mg Oral BID   polyethylene glycol  17 g Oral Daily   polyvinyl alcohol  1 drop Both Eyes Q12H   predniSONE  10 mg Oral Q breakfast   rosuvastatin  5 mg Oral QPM   senna  1 tablet Oral BID   sodium chloride flush  10-40 mL Intracatheter Q12H   Continuous Infusions:  sodium chloride       LOS: 17 days   [name], DO [facility] Available via Epic secure chat 7am-7pm After these hours, please refer to coverage provider listed on [email or web address] 04/20/2021, 7:03 PM

## 2021-04-20 NOTE — Progress Notes (Signed)
[facility] ROUNDING NOTE   Subjective:   Interval History: 76 year old lady with stage IIIb chronic kidney disease.  Admitted 04/03/2021 with  fall and hip fracture.  Low blood pressures postoperatively suspecting acute tubular necrosis.  Dialysis initiated 04/11/2021 with transition to CRRT 04/12/2021 with BiPAP dependent respiratory failure.  Previous history includes a history of CAD status post CABG. CRRT discontinued on 04/14/2021.   Transition to intermittent hemodialysis.  She is currently undergoing dialysis 04/20/2021  Blood pressure 111/62 pulse 93 temperature 97.7 O2 sats 100% 8 L nasal cannula  Sodium 133 potassium 4.1 chloride 94 CO2 26 BUN 6 7 creatinine 4.1 glucose 95 calcium 8.8 phosphorus 6.1 magnesium 2.3 hemoglobin 8.6   Objective:  Vital signs in last 24 hours:  Temp:  [97.5 F (36.4 C)-98.1 F (36.7 C)] 97.7 F (36.5 C) (03/11 0810) Pulse Rate:  [63-99] 93 (03/11 1030) Resp:  [17-28] 18 (03/11 1030) BP: (85-155)/(43-78) 111/62 (03/11 1030) SpO2:  [90 %-100 %] 100 % (03/11 1030) Weight:  [98 kg] 98 kg (03/11 0810)  Weight change:  Filed Weights   04/18/21 1840 04/19/21 0500 04/20/21 0810  Weight: (P) 99.2 kg 97.1 kg 98 kg    Intake/Output: I/O last 3 completed shifts: In: 860 [P.O.:860] Out: 3000 [Other:3000]   Intake/Output this shift:  No intake/output data recorded.  Gen obese, somnolent, deconditioned, on BiPAP RRR no RG Abd soft obese ntnd no mass or ascites +bs MS no joint effusions or deformity Ext trace LE/ UE edema IJ [facility] noted   Basic Metabolic Panel: Recent Labs  Lab 04/16/21 0352 04/16/21 1534 04/17/21 0414 04/18/21 0315 04/19/21 0533 04/19/21 2057 04/20/21 0322  NA 132*   < > 133* 132* 133* 134* 133*  K 4.5   < > 3.5 4.0 3.7 4.2 4.1  CL 98   < > 98 94* 96* 95* 94*  CO2 23   < > 26 25 21* 25 26  GLUCOSE 109*   < > 127* 119* 122* 171* 95  BUN 75*   < > 43* 69* 43* 61* 67*  CREATININE 3.58*   < > 2.57* 3.67* 2.86*  3.86* 4.14*  CALCIUM 9.2   < > 8.9 9.1 8.6* 8.8* 8.8*  MG 2.8*  --  2.2 2.3 2.1  --  2.3  PHOS 3.3   < > 2.1* 3.3 3.2 4.8* 6.1*   < > = values in this interval not displayed.     Liver Function Tests: Recent Labs  Lab 04/18/21 0315 04/18/21 0331 04/19/21 0533 04/19/21 2057 04/20/21 0322  AST  --  22 26  --  25  ALT  --  23 22  --  23  ALKPHOS  --  136* 150*  --  142*  BILITOT  --  0.7 1.0  --  0.8  PROT  --  6.4* 6.8  --  6.7  ALBUMIN 2.0* 2.0* 2.2* 2.1* 2.2*    No results for input(s): LIPASE, AMYLASE in the last 168 hours. No results for input(s): AMMONIA in the last 168 hours.  CBC: Recent Labs  Lab 04/14/21 0317 04/14/21 1225 04/15/21 1726 04/18/21 1002 04/19/21 0533 04/19/21 2100 04/20/21 0322  WBC 15.5*  --   --  10.8* 11.2* 8.7 9.5  NEUTROABS 13.9*  --   --  8.9* 9.4*  --  7.5  HGB 8.5*   < > 8.8* 8.7* 9.5* 9.0* 8.6*  HCT 28.2*   < > 26.0* 27.1* 30.1* 29.6* 27.5*  MCV 95.6  --   --  93.1 93.2 95.2 94.8  PLT 224  --   --  177 178 174 167   < > = values in this interval not displayed.     Cardiac Enzymes: No results for input(s): CKTOTAL, CKMB, CKMBINDEX, TROPONINI in the last 168 hours.  BNP: Invalid input(s): POCBNP  CBG: Recent Labs  Lab 04/19/21 0721 04/19/21 1118 04/19/21 1720 04/19/21 2218 04/20/21 0758  GLUCAP 117* 200* 122* 133* 104*     Microbiology: Results for orders placed or performed during the hospital encounter of 04/03/21  Resp Panel by RT-PCR (Flu A&B, Covid) Nasopharyngeal Swab     Status: None   Collection Time: 04/03/21  9:40 AM   Specimen: Nasopharyngeal Swab; Nasopharyngeal(NP) swabs in vial transport medium  Result Value Ref Range Status   SARS Coronavirus 2 by RT PCR NEGATIVE NEGATIVE Final    Comment: (NOTE) SARS-CoV-2 target nucleic acids are NOT DETECTED.  The SARS-CoV-2 RNA is generally detectable in upper respiratory specimens during the acute phase of infection. The lowest concentration of SARS-CoV-2  viral copies this assay can detect is 138 copies/mL. A negative result does not preclude SARS-Cov-2 infection and should not be used as the sole basis for treatment or other patient management decisions. A negative result may occur with  improper specimen collection/handling, submission of specimen other than nasopharyngeal swab, presence of viral mutation(s) within the areas targeted by this assay, and inadequate number of viral copies(<138 copies/mL). A negative result must be combined with clinical observations, patient history, and epidemiological information. The expected result is Negative.  Fact Sheet for Patients:  EntrepreneurPulse.com.au  Fact Sheet for Healthcare Providers:  IncredibleEmployment.be  This test is no t yet approved or cleared by the [place] FDA and  has been authorized for detection and/or diagnosis of SARS-CoV-2 by FDA under an Emergency Use Authorization (EUA). This EUA will remain  in effect (meaning this test can be used) for the duration of the COVID-19 declaration under Section 564(b)(1) of the Act, 21 U.S.C.section 360bbb-3(b)(1), unless the authorization is terminated  or revoked sooner.       Influenza A by PCR NEGATIVE NEGATIVE Final   Influenza B by PCR NEGATIVE NEGATIVE Final    Comment: (NOTE) The Xpert Xpress SARS-CoV-2/FLU/RSV plus assay is intended as an aid in the diagnosis of influenza from Nasopharyngeal swab specimens and should not be used as a sole basis for treatment. Nasal washings and aspirates are unacceptable for Xpert Xpress SARS-CoV-2/FLU/RSV testing.  Fact Sheet for Patients: EntrepreneurPulse.com.au  Fact Sheet for Healthcare Providers: IncredibleEmployment.be  This test is not yet approved or cleared by the [place] FDA and has been authorized for detection and/or diagnosis of SARS-CoV-2 by FDA under an Emergency Use Authorization  (EUA). This EUA will remain in effect (meaning this test can be used) for the duration of the COVID-19 declaration under Section 564(b)(1) of the Act, 21 U.S.C. section 360bbb-3(b)(1), unless the authorization is terminated or revoked.  Performed at [facility], [place] [address], [place], [place] [address]   Surgical PCR Screen     Status: None   Collection Time: 04/04/21  3:45 PM   Specimen: Nasal Mucosa; Nasal Swab  Result Value Ref Range Status   MRSA, PCR NEGATIVE NEGATIVE Final   Staphylococcus aureus NEGATIVE NEGATIVE Final    Comment: (NOTE) The Xpert SA Assay (FDA approved for NASAL specimens in patients 63 years of age and older), is one component of a comprehensive surveillance program. It is not intended to diagnose  infection nor to guide or monitor treatment. Performed at [facility], [place] [address], [place], [place] [address]    *Note: Due to a large number of results and/or encounters for the requested time period, some results have not been displayed. A complete set of results can be found in Results Review.    Coagulation Studies: No results for input(s): LABPROT, INR in the last 72 hours.  Urinalysis: No results for input(s): COLORURINE, LABSPEC, PHURINE, GLUCOSEU, HGBUR, BILIRUBINUR, KETONESUR, PROTEINUR, UROBILINOGEN, NITRITE, LEUKOCYTESUR in the last 72 hours.  Invalid input(s): APPERANCEUR    Imaging: DG CHEST PORT 1 VIEW  Result Date: 04/19/2021 CLINICAL DATA:  Shortness of breath, evaluate congestion. EXAM: PORTABLE CHEST 1 VIEW COMPARISON:  Radiographs dated April 17, 2021 FINDINGS: The heart is enlarged. Atherosclerotic calcification of aortic arch. Evidence of prior CABG. Mild bibasilar atelectasis with possible small left pleural effusion, unchanged. Left access dialysis catheter is unchanged. Right-sided multi lead pacing device is also unchanged. No acute osseous abnormality. IMPRESSION: 1. Stable cardiomegaly with mild bibasilar  atelectasis and possible small left effusion, unchanged. 2. Left access dialysis catheter with distal tip in the SVC. Electronically Signed   By: Keane Police D.O.   On: 04/19/2021 08:10     Medications:    sodium chloride     sodium chloride     sodium chloride     sodium chloride     sodium chloride      allopurinol  100 mg Oral Daily   aspirin  81 mg Oral Daily   Chlorhexidine Gluconate Cloth  6 each Topical Q0600   Chlorhexidine Gluconate Cloth  6 each Topical Q0600   clopidogrel  75 mg Oral Daily   cycloSPORINE  1 drop Both Eyes BID   heparin injection (subcutaneous)  5,000 Units Subcutaneous Q8H   insulin aspart  0-6 Units Subcutaneous TID WC   mouth rinse  15 mL Mouth Rinse q12n4p   midodrine  10 mg Oral 3 times per day on Tue Thu Sat   mometasone-formoterol  2 puff Inhalation BID   pantoprazole sodium  40 mg Oral BID   polyethylene glycol  17 g Oral Daily   polyvinyl alcohol  1 drop Both Eyes Q12H   predniSONE  10 mg Oral Q breakfast   rosuvastatin  5 mg Oral QPM   senna  1 tablet Oral BID   sodium chloride flush  10-40 mL Intracatheter Q12H   sodium chloride, sodium chloride, sodium chloride, sodium chloride, acetaminophen, albuterol, alteplase, alum & mag hydroxide-simeth, bisacodyl, guaiFENesin, heparin, hydrALAZINE, HYDROcodone-acetaminophen, lidocaine (PF), lidocaine-prilocaine, melatonin, ondansetron (ZOFRAN) IV, pentafluoroprop-tetrafluoroeth, simethicone, sodium chloride, sodium chloride flush  Assessment/ Plan:  Acute on chronic kidney disease.  Baseline creatinine about 1.3 mg/dL.  Status post hip fracture now with hypotension and respiratory distress.  Patient placed on CRRT 04/12/2021 This was discontinued on 04/14/2021.  Appears to have poor urine output.  Hemodialysis requested although historically did not tolerate.  She continues a full CODE STATUS.  She continues on dialysis 04/20/2021 ANEMIA-    add darbepoetin 100 mcg weekly MBD-at calcium phosphate 2 pills  with meals HTN/VOL-  She has little urine output.  She appears to be tolerating dialysis this point Status post hip fracture at 04/03/2021    LOS: [place] '@TODAY''@10'$ :36 AM

## 2021-04-20 NOTE — Progress Notes (Signed)
? ?  Subjective: ? ?Mental status continues to improve. Transferred back to floor. On [place] this AM. Worked with PT but having difficulty with ambulation. ? ?Objective:  ? ?VITALS:   ?Vitals:  ? 04/19/21 2129 04/19/21 2316 04/20/21 0014 04/20/21 0553  ?BP:   (!) 109/51 (!) 127/56  ?Pulse: 94 97 86 90  ?Resp: (!) '28 18 19 19  '$ ?Temp:   (!) 97.5 ?F (36.4 ?C) 97.8 ?F (36.6 ?C)  ?TempSrc:   Oral Oral  ?SpO2: 98% 100% 100% 100%  ?Weight:      ?Height:      ?Dressing is clean dry intact.  Is able to dorsiflex plantarflex the left foot.  Fires EHL gastrocsoleus.  Sensation is intact throughout.  2+ dorsalis pedis pulse ? ?Lab Results  ?Component Value Date  ? WBC 9.5 04/20/2021  ? HGB 8.6 (L) 04/20/2021  ? HCT 27.5 (L) 04/20/2021  ? MCV 94.8 04/20/2021  ? PLT 167 04/20/2021  ? ? ? ?Assessment/Plan: ? ?Status post left hip cephalomedullary nail 0/80, complicated by renal ATN in the setting of hypotension ? ?- Continue to monitor anemia, vitals ?- Patient to work with PT/OT to optimize mobilization ?- Plan for staples out monday ?- DVT ppx - SCDs, ambulation, okay with home anticoagulation aspirin Plavix ?- WBAT operative extremity ? Vanetta Mulders ?04/20/2021, 7:46 AM ? ?

## 2021-04-20 NOTE — Progress Notes (Signed)
Transport came for patient to take her to HD however patient is on 8L [place] and need an nurse to travel.  HD will call back after shift change. ?

## 2021-04-20 NOTE — Progress Notes (Signed)
RT called to bedside by RN due to pt bipap mask leak. Once at bedside pt refused to try bipap again and was already back on [place]. RT and family at bedside attempted to educate pt on need to wear bipap but pt is adamant she is unable to tolerate bipap because she cannot breathe with the bipap on. Pt is currently back on 8L [place] w/ stable vitals and no notable discomfort. RN is aware and RT will cont to monitor.  ?

## 2021-04-20 NOTE — Progress Notes (Signed)
Pt refused to reattempt bipap tonight. RT will cont to monitor. ?

## 2021-04-20 NOTE — Progress Notes (Signed)
Patient continues to be comfortable with [place] on at 8L.   ? ?MD, patient's sister is wondering if patient will restart Lasix. ?

## 2021-04-21 ENCOUNTER — Inpatient Hospital Stay (HOSPITAL_COMMUNITY): Payer: Medicare Other

## 2021-04-21 DIAGNOSIS — I10 Essential (primary) hypertension: Secondary | ICD-10-CM | POA: Diagnosis not present

## 2021-04-21 DIAGNOSIS — I442 Atrioventricular block, complete: Secondary | ICD-10-CM | POA: Diagnosis not present

## 2021-04-21 DIAGNOSIS — W19XXXA Unspecified fall, initial encounter: Secondary | ICD-10-CM | POA: Diagnosis not present

## 2021-04-21 DIAGNOSIS — I5032 Chronic diastolic (congestive) heart failure: Secondary | ICD-10-CM | POA: Diagnosis not present

## 2021-04-21 LAB — RENAL FUNCTION PANEL
Albumin: 2.1 g/dL — ABNORMAL LOW (ref 3.5–5.0)
Anion gap: 12 (ref 5–15)
BUN: 38 mg/dL — ABNORMAL HIGH (ref 8–23)
CO2: 25 mmol/L (ref 22–32)
Calcium: 8.8 mg/dL — ABNORMAL LOW (ref 8.9–10.3)
Chloride: 96 mmol/L — ABNORMAL LOW (ref 98–111)
Creatinine, Ser: 2.96 mg/dL — ABNORMAL HIGH (ref 0.44–1.00)
GFR, Estimated: 16 mL/min — ABNORMAL LOW (ref >60)
Glucose, Bld: 117 mg/dL — ABNORMAL HIGH (ref 70–99)
Phosphorus: 5.4 mg/dL — ABNORMAL HIGH (ref 2.5–4.6)
Potassium: 4 mmol/L (ref 3.5–5.1)
Sodium: 133 mmol/L — ABNORMAL LOW (ref 135–145)

## 2021-04-21 LAB — GLUCOSE, CAPILLARY
Glucose-Capillary: 92 mg/dL (ref 70–99)
Glucose-Capillary: 117 mg/dL — ABNORMAL HIGH (ref 70–99)
Glucose-Capillary: 164 mg/dL — ABNORMAL HIGH (ref 70–99)
Glucose-Capillary: 152 mg/dL — ABNORMAL HIGH (ref 70–99)

## 2021-04-21 LAB — FERRITIN: Ferritin: 206 ng/mL (ref 11–307)

## 2021-04-21 LAB — IRON AND TIBC
Iron: 39 ug/dL (ref 28–170)
Saturation Ratios: 12 % (ref 10.4–31.8)
TIBC: 329 ug/dL (ref 250–450)
UIBC: 290 ug/dL

## 2021-04-21 LAB — MAGNESIUM: Magnesium: 2.2 mg/dL (ref 1.7–2.4)

## 2021-04-21 MED ORDER — POLYETHYLENE GLYCOL 3350 17 G PO PACK
17.0000 g | PACK | Freq: Two times a day (BID) | ORAL | Status: DC
  Administered 2021-04-21 – 2021-04-27 (×11): 17 g via ORAL
  Filled 2021-04-21 (×13): qty 1

## 2021-04-21 MED ORDER — SENNOSIDES-DOCUSATE SODIUM 8.6-50 MG PO TABS
1.0000 | ORAL_TABLET | Freq: Two times a day (BID) | ORAL | Status: DC
  Administered 2021-04-21 – 2021-04-24 (×5): 1 via ORAL
  Filled 2021-04-21 (×5): qty 1

## 2021-04-21 MED ORDER — SODIUM CHLORIDE 0.9 % IV SOLN
200.0000 mg | INTRAVENOUS | Status: AC
  Administered 2021-04-21 – 2021-04-22 (×2): 200 mg via INTRAVENOUS
  Filled 2021-04-21 (×2): qty 10

## 2021-04-21 MED ORDER — BARIUM SULFATE 2 % PO SUSP
ORAL | Status: AC
Start: 2021-04-21 — End: 2021-04-21
  Administered 2021-04-21: 1 mL
  Filled 2021-04-21: qty 2

## 2021-04-21 NOTE — Assessment & Plan Note (Addendum)
-  Continues to have Abdominal Pain and states she hasn't had a bowel movement; Bowel regimen adjusted  -Obtain a CT Abd/Pelvis w/ Oral Contrast and showed "No acute findings in the abdomen or pelvis. Trace bilateral effusions, bibasilar atelectasis. Aortic atherosclerosis. Colonic diverticulosis." Did show a Moderate Stool burden -Abdominal Pain is improving

## 2021-04-21 NOTE — Progress Notes (Signed)
Consulted Doc about order for ABD- CT w/ contrast due to pt. being renal with 1200ML fluid restriction ?

## 2021-04-21 NOTE — Progress Notes (Signed)
PROGRESS NOTE    [name]  LSL:[id number] DOB: 04-03-1945 DOA: 04/03/2021 PCP: Lujean Amel, MD   Brief Narrative:  The patient is 76 year old obese female with known OSA OHS O2 dependent at 3 L nasal cannula who fell and suffered a fracture left femur.  She developed worsening renal failure while in the hospital required a HD catheter that was tunneled and placed on 04/11/2021 per interventional radiology.  On 04/12/2021 she had worsening respiratory failure she was obtunded and was transferred to intensive care unit for further evaluation and treatment by pulmonary critical care.    Significant Events:   04/12/2021 transferred intensive care unit; CRRT started 3/4: improved mentation  3/5: CRRT stopped 3/6: no UOP, increasing creatinine 3/7: tolerated iHD 3/8: d/c a-line. Transfer to progressive. 3/9 TRH assumed care and Dialysis to be attempted again and continues to have poor UOP 3/10 she was able to be weaned to 8 L [place] high flow nasal cannula with no respiratory distress; next dialysis session is tomorrow 3/11 underwent dialysis today had had 1500 mL removed. Had to have Midodrine for BP support. PT/OT recommending SNF and Ortho recommending staple Removal Monday  3/12 patient continued to complain of some abdominal pain and has not had a bowel movement so we will continue bowel regimen and obtain a CT of the abdomen pelvis with oral contrast.  Plan is for dialysis on Tuesday 3/14 now.   Assessment and Plan: * Closed left hip fracture ([place]) secondary to fall at home -Patient presents after having a fall found to have a left hip and left superior pubic ramus fracture.  Orthopedics consulted with plans to possibly take patient to the operating room in a.m. based off the geriatric perioperative cardiac risk assessment score patient has 16.1% probability of perioperative cardiac arrest or myocardial infarction. -Admitted to a surgical telemetry bed -Hip fracture order set utilized  and now s/p ORIF -Hydrocodone/morphine IV as needed pain; Plan is for Staple Removal 04/22/21 -Transitions of care consulted as pateint will need SNF     Hypokalemia -Improved. K+ is now 4.0 -Continue to monitor and adjust doses as needed  Fall at home, initial encounter -Resulted in Hip Fracture and she is s/p ORIF  Chronic kidney disease, stage III B (moderate) ([place]) -On admission creatinine 1.69 which appears slightly above baseline which appears to be 1.3-1.5. -Went into Renal Failure and now had  Acute on chronic renal failure with anuria -Did not tolerate iHD due to hypotension so required CRRT 3/3-3/5.  Tolerated iHD 3/7 with net UF 2L and also 3/9 as well as 3/11 where she had 1500 mL removed  Primary management per nephrology Last HD done 3/11 Continue Midodrine 10 mg po TID   Closed fracture of left superior pubic ramus ([place]) -Secondary to fall.  No surgical intervention recommended for treatment of this fracture.  Thrombocytopenia ([place]) -Improved. Platelet Count is now stable at 167 on last check -Continue to Monitor for S/Sx of Bleeding; no overt bleeding noted -Repeat CBC in the AM   Respiratory failure (HCC) -Acute on Chronic Hypoxic/Hypercapneic Failure  -Patient has history of obesity hypoventilation syndrome and OSA.  She is chronically on 3 L of oxygen at baseline. -SpO2: 96 % O2 Flow Rate (L/min): 4 L/min FiO2 (%): (S) 35 %; O2 continues to Wean -Suspect this will continue to improve with volume removal from HD as she had 1500 mL removed yesterday   OSA/OHS Wean HHF for O2 saturations 88-92% BiPAP qHS Will need home BiPAP  set up prior to discharge  Diabetes mellitus without complication (HCC) Last hemoglobin A1c was 6.5 less than 3 months ago.  Patient is on Jardiance 10 mg daily. -Hypoglycemic protocols -Held Jardiance -CBGs before every meal with sensitive SSI -Adjust insulin regimen as needed -CBGs ranging from 92-153 now  CHB (complete heart  block) (HCC) S/p PPM/ICD   Chronic diastolic CHF (congestive heart failure) (HCC) -Chronic.  Last echocardiogram noted EF of 55 to 60% with grade 1 diastolic dysfunction in 04/2200.  BNP improved to 333.8 from 712 on 2/6.  Patient has some mild lower extremity edema but does not appear grossly fluid overloaded.  She had been seen by her cardiologist Dr. Burt Knack on 2/20 and recommended to increase Torsemide to 80 mg daily but now stoped due to soft BP and because patient getting Dialysis.  -Strict I&Os and daily weights -Continued current dose of Torsemide  S/P CABG x 5 -Last cardiac catheterization was in 02/2019 where all territories were noted to have adequate coronary flow via bypasses or native vessels with stents. -Continue aspirin, Plavix, and Crestor -BB and Isororbide held due to Hypotension earlier but may resume slowly  Abdominal pain -Continues to have Abdominal Pain and states she hasn't had a bowel movement -Obtain a CT Abd/Pelvis w/ Oral Contrast -  Mixed hypercholesterolemia and hypertriglyceridemia -Continue Crestor  HTN (hypertension), benign -Home medication regimen includes bisoprolol 7.5 mg daily, Imdur 90 mg daily, torsemide 80 mg daily which were held due to her softer BP -GDMT for Chronic Diastolic Heart Failure held due to Hypotension and will resume as Able but BP still on the soft side    ANEMIA -Hemoglobin 10.8 g/dL which appears around lower end of patient's baseline which ranges from 10- 12 g/dL. -Now Hgb/Hct went from 8.7/27.1 -> 9.5/30.1 -> 9.0/29.6 -> 8.6/27.5 on last check Anemia Panel showed iron level of 39, U IBC of 290, TIBC of 329, saturation ratios of 12%, ferritin level 206 -Continue to monitor H&H and continue to Monitor for S/Sx of Bleeding -Repeat CBC in the AM  DVT prophylaxis: heparin injection 5,000 Units Start: 04/15/21 1400    Code Status: Full Code Family Communication: Discussed with Family at bedside  Disposition Plan:  Level of  care: Progressive Status is: Inpatient Remains inpatient appropriate because: Patient has Abdominal Pain and her O2 requirements are improving    Consultants:  PCCM Transfer Cardiology Nephrology Orthopedic Surgery  Procedures:  As above  Antimicrobials:  Anti-infectives (From admission, onward)    Start     Dose/Rate Route Frequency Ordered Stop   04/11/21 1530  vancomycin (VANCOCIN) IVPB 1000 mg/200 mL premix        1,000 mg 200 mL/hr over 60 Minutes Intravenous  Once 04/11/21 1432 04/12/21 1952   04/04/21 1423  vancomycin (VANCOCIN) powder  Status:  Discontinued          As needed 04/04/21 1424 04/04/21 1551   04/04/21 1200  vancomycin (VANCOREADY) IVPB 1500 mg/300 mL        1,500 mg 150 mL/hr over 120 Minutes Intravenous On call to O.R. 04/03/21 1241 04/04/21 1529   04/04/21 1145  ceFAZolin (ANCEF) IVPB 2g/100 mL premix  Status:  Discontinued        2 g 200 mL/hr over 30 Minutes Intravenous On call to O.R. 04/04/21 5427 04/04/21 1717       Subjective: Seen and examined at bedside and she is complaining of some back pain and was also complaining of some abdominal pain.  States  that she continues have a bowel movement and states that she feels like she has to pass gas but does not.  Denies any chest pain but shortness of breath is improving.  States is difficult for her to ambulate.  No other concerns or complaints this time.  Objective: Vitals:   04/21/21 0718 04/21/21 0827 04/21/21 1141 04/21/21 1428  BP: (!) 104/50  (!) 122/59   Pulse: 82  78   Resp: (!) 22  20   Temp: 98 F (36.7 C)  98.3 F (36.8 C)   TempSrc: Oral  Oral   SpO2: 100% 94% 100% 96%  Weight:      Height:        Intake/Output Summary (Last 24 hours) at 04/21/2021 1707 Last data filed at 04/20/2021 1800 Gross per 24 hour  Intake 120 ml  Output --  Net 120 ml   Filed Weights   04/19/21 0500 04/20/21 0810 04/20/21 1152  Weight: 97.1 kg 98 kg 96.2 kg   Examination: Physical  Exam:  Constitutional: WN/WD obese chronically ill-appearing, NAD and appears calm and comfortable Respiratory: Diminished to auscultation bilaterally with coarse breath sounds, no wheezing, rales, rhonchi or crackles. Normal respiratory effort and patient is not tachypenic. No accessory muscle use. Wearing Supplemental O2 via [place] Cardiovascular: RRR, no murmurs / rubs / gallops. S1 and S2 auscultated. 1+ LE edema  Abdomen: Soft, tender to palpate, Distended 2/2 body habitus. Bowel sounds positive.  GU: Deferred. Musculoskeletal: No clubbing / cyanosis of digits/nails. No joint deformity upper and lower extremities. Neurologic: CN 2-12 grossly intact with no focal deficits.   Data Reviewed: I have personally reviewed following labs and imaging studies  CBC: Recent Labs  Lab 04/15/21 1726 04/18/21 1002 04/19/21 0533 04/19/21 2100 04/20/21 0322  WBC  --  10.8* 11.2* 8.7 9.5  NEUTROABS  --  8.9* 9.4*  --  7.5  HGB 8.8* 8.7* 9.5* 9.0* 8.6*  HCT 26.0* 27.1* 30.1* 29.6* 27.5*  MCV  --  93.1 93.2 95.2 94.8  PLT  --  177 178 174 397   Basic Metabolic Panel: Recent Labs  Lab 04/17/21 0414 04/18/21 0315 04/19/21 0533 04/19/21 2057 04/20/21 0322 04/21/21 0049  NA 133* 132* 133* 134* 133* 133*  K 3.5 4.0 3.7 4.2 4.1 4.0  CL 98 94* 96* 95* 94* 96*  CO2 26 25 21* '25 26 25  '$ GLUCOSE 127* 119* 122* 171* 95 117*  BUN 43* 69* 43* 61* 67* 38*  CREATININE 2.57* 3.67* 2.86* 3.86* 4.14* 2.96*  CALCIUM 8.9 9.1 8.6* 8.8* 8.8* 8.8*  MG 2.2 2.3 2.1  --  2.3 2.2  PHOS 2.1* 3.3 3.2 4.8* 6.1* 5.4*   GFR: Estimated Creatinine Clearance: 17.6 mL/min (A) (by C-G formula based on SCr of 2.96 mg/dL (H)). Liver Function Tests: Recent Labs  Lab 04/18/21 0331 04/19/21 0533 04/19/21 2057 04/20/21 0322 04/21/21 0049  AST 22 26  --  25  --   ALT 23 22  --  23  --   ALKPHOS 136* 150*  --  142*  --   BILITOT 0.7 1.0  --  0.8  --   PROT 6.4* 6.8  --  6.7  --   ALBUMIN 2.0* 2.2* 2.1* 2.2* 2.1*   No  results for input(s): LIPASE, AMYLASE in the last 168 hours. No results for input(s): AMMONIA in the last 168 hours. Coagulation Profile: No results for input(s): INR, PROTIME in the last 168 hours. Cardiac Enzymes: No results for input(s): CKTOTAL,  CKMB, CKMBINDEX, TROPONINI in the last 168 hours. BNP (last 3 results) No results for input(s): PROBNP in the last 8760 hours. HbA1C: No results for input(s): HGBA1C in the last 72 hours. CBG: Recent Labs  Lab 04/20/21 1223 04/20/21 1612 04/20/21 2105 04/21/21 0758 04/21/21 1140  GLUCAP 153* 126* 124* 92 117*   Lipid Profile: No results for input(s): CHOL, HDL, LDLCALC, TRIG, CHOLHDL, LDLDIRECT in the last 72 hours. Thyroid Function Tests: No results for input(s): TSH, T4TOTAL, FREET4, T3FREE, THYROIDAB in the last 72 hours. Anemia Panel: Recent Labs    04/21/21 0049  FERRITIN 206  TIBC 329  IRON 39   Sepsis Labs: No results for input(s): PROCALCITON, LATICACIDVEN in the last 168 hours.  No results found for this or any previous visit (from the past 240 hour(s)).   Radiology Studies: DG Thoracic Spine 2 View  Result Date: 04/21/2021 CLINICAL DATA:  Fall.  Thoracic back pain. EXAM: THORACIC SPINE 2 VIEWS COMPARISON:  None. FINDINGS: There is no evidence of thoracic spine fracture. Alignment is normal. Diffuse intervertebral ankylosis is again seen throughout the thoracic spine. Generalized osteopenia noted. Cervical spine fusion hardware again demonstrated. IMPRESSION: No acute findings. Diffuse thoracic intervertebral ankylosis and osteopenia. Electronically Signed   By: Marlaine Hind M.D.   On: 04/21/2021 13:31   DG CHEST PORT 1 VIEW  Result Date: 04/20/2021 CLINICAL DATA:  Shortness of breath EXAM: PORTABLE CHEST 1 VIEW COMPARISON:  04/19/2021 and prior radiographs FINDINGS: UPPER limits normal heart size and CABG changes again noted. LEFT IJ central venous catheter and RIGHT pacemaker/AICD again noted. Bibasilar opacities are  unchanged and may represent atelectasis. There may be trace bilateral pleural effusions present. There is no evidence of pneumothorax. IMPRESSION: Unchanged appearance of the chest. Unchanged bibasilar opacities which may represent atelectasis. Possible trace bilateral pleural effusions. Electronically Signed   By: Margarette Canada M.D.   On: 04/20/2021 14:24    Scheduled Meds:  allopurinol  100 mg Oral Daily   aspirin  81 mg Oral Daily   calcium acetate  1,334 mg Oral TID WC   Chlorhexidine Gluconate Cloth  6 each Topical Q0600   Chlorhexidine Gluconate Cloth  6 each Topical Q0600   clopidogrel  75 mg Oral Daily   cycloSPORINE  1 drop Both Eyes BID   darbepoetin (ARANESP) injection - DIALYSIS  100 mcg Intravenous Q Sat-HD   heparin injection (subcutaneous)  5,000 Units Subcutaneous Q8H   insulin aspart  0-6 Units Subcutaneous TID WC   mouth rinse  15 mL Mouth Rinse q12n4p   midodrine  10 mg Oral 3 times per day on Tue Thu Sat   mometasone-formoterol  2 puff Inhalation BID   pantoprazole sodium  40 mg Oral BID   polyethylene glycol  17 g Oral BID   polyvinyl alcohol  1 drop Both Eyes Q12H   predniSONE  10 mg Oral Q breakfast   rosuvastatin  5 mg Oral QPM   senna-docusate  1 tablet Oral BID   sodium chloride flush  10-40 mL Intracatheter Q12H   Continuous Infusions:  sodium chloride     iron sucrose 200 mg (04/21/21 1452)    LOS: 18 days   [name], DO [facility] Available via Epic secure chat 7am-7pm After these hours, please refer to coverage provider listed on [email or web address] 04/21/2021, 5:07 PM

## 2021-04-21 NOTE — Progress Notes (Signed)
[facility] ROUNDING NOTE   Subjective:   Interval History: 76 year old lady with stage IIIb chronic kidney disease.  Admitted 04/03/2021 with  fall and hip fracture.  Low blood pressures postoperatively suspecting acute tubular necrosis.  Dialysis initiated 04/11/2021 with transition to CRRT 04/12/2021 with BiPAP dependent respiratory failure.  Previous history includes a history of CAD status post CABG. CRRT discontinued on 04/14/2021.   Transition to intermittent hemodialysis.   Successful dialysis 3/11 with 1.4 L removed   next HD 3/14  Blood pressure  130/60  pulse 77   T 98.3  Sodium 133 potassium 4.0 chloride 96 CO2 25 BUN 38 creatinine 2.96 glucose 95 calcium 8.8 phosphorus 5.4 magnesium 2.3 hemoglobin 8.6 iron 12 %   Objective:  Vital signs in last 24 hours:  Temp:  [97.7 F (36.5 C)-98.3 F (36.8 C)] 98.3 F (36.8 C) (03/12 1141) Pulse Rate:  [78-91] 78 (03/12 1141) Resp:  [19-22] 20 (03/12 1141) BP: (94-122)/(46-67) 122/59 (03/12 1141) SpO2:  [94 %-100 %] 100 % (03/12 1141)  Weight change:  Filed Weights   04/19/21 0500 04/20/21 0810 04/20/21 1152  Weight: 97.1 kg 98 kg 96.2 kg    Intake/Output: I/O last 3 completed shifts: In: 480 [P.O.:480] Out: 1461 [Other:1461]   Intake/Output this shift:  No intake/output data recorded.  Gen obese, somnolent, deconditioned, on BiPAP RRR no RG Abd soft obese ntnd no mass or ascites +bs MS no joint effusions or deformity Ext trace LE/ UE edema IJ [facility] noted   Basic Metabolic Panel: Recent Labs  Lab 04/17/21 0414 04/18/21 0315 04/19/21 0533 04/19/21 2057 04/20/21 0322 04/21/21 0049  NA 133* 132* 133* 134* 133* 133*  K 3.5 4.0 3.7 4.2 4.1 4.0  CL 98 94* 96* 95* 94* 96*  CO2 26 25 21* '25 26 25  '$ GLUCOSE 127* 119* 122* 171* 95 117*  BUN 43* 69* 43* 61* 67* 38*  CREATININE 2.57* 3.67* 2.86* 3.86* 4.14* 2.96*  CALCIUM 8.9 9.1 8.6* 8.8* 8.8* 8.8*  MG 2.2 2.3 2.1  --  2.3 2.2  PHOS 2.1* 3.3 3.2 4.8* 6.1* 5.4*      Liver Function Tests: Recent Labs  Lab 04/18/21 0331 04/19/21 0533 04/19/21 2057 04/20/21 0322 04/21/21 0049  AST 22 26  --  25  --   ALT 23 22  --  23  --   ALKPHOS 136* 150*  --  142*  --   BILITOT 0.7 1.0  --  0.8  --   PROT 6.4* 6.8  --  6.7  --   ALBUMIN 2.0* 2.2* 2.1* 2.2* 2.1*    No results for input(s): LIPASE, AMYLASE in the last 168 hours. No results for input(s): AMMONIA in the last 168 hours.  CBC: Recent Labs  Lab 04/15/21 1726 04/18/21 1002 04/19/21 0533 04/19/21 2100 04/20/21 0322  WBC  --  10.8* 11.2* 8.7 9.5  NEUTROABS  --  8.9* 9.4*  --  7.5  HGB 8.8* 8.7* 9.5* 9.0* 8.6*  HCT 26.0* 27.1* 30.1* 29.6* 27.5*  MCV  --  93.1 93.2 95.2 94.8  PLT  --  177 178 174 167     Cardiac Enzymes: No results for input(s): CKTOTAL, CKMB, CKMBINDEX, TROPONINI in the last 168 hours.  BNP: Invalid input(s): POCBNP  CBG: Recent Labs  Lab 04/20/21 1223 04/20/21 1612 04/20/21 2105 04/21/21 0758 04/21/21 1140  GLUCAP 153* 126* 124* 92 117*     Microbiology: Results for orders placed or performed during the hospital encounter of  04/03/21  Resp Panel by RT-PCR (Flu A&B, Covid) Nasopharyngeal Swab     Status: None   Collection Time: 04/03/21  9:40 AM   Specimen: Nasopharyngeal Swab; Nasopharyngeal(NP) swabs in vial transport medium  Result Value Ref Range Status   SARS Coronavirus 2 by RT PCR NEGATIVE NEGATIVE Final    Comment: (NOTE) SARS-CoV-2 target nucleic acids are NOT DETECTED.  The SARS-CoV-2 RNA is generally detectable in upper respiratory specimens during the acute phase of infection. The lowest concentration of SARS-CoV-2 viral copies this assay can detect is 138 copies/mL. A negative result does not preclude SARS-Cov-2 infection and should not be used as the sole basis for treatment or other patient management decisions. A negative result may occur with  improper specimen collection/handling, submission of specimen other than  nasopharyngeal swab, presence of viral mutation(s) within the areas targeted by this assay, and inadequate number of viral copies(<138 copies/mL). A negative result must be combined with clinical observations, patient history, and epidemiological information. The expected result is Negative.  Fact Sheet for Patients:  EntrepreneurPulse.com.au  Fact Sheet for Healthcare Providers:  IncredibleEmployment.be  This test is no t yet approved or cleared by the [place] FDA and  has been authorized for detection and/or diagnosis of SARS-CoV-2 by FDA under an Emergency Use Authorization (EUA). This EUA will remain  in effect (meaning this test can be used) for the duration of the COVID-19 declaration under Section 564(b)(1) of the Act, 21 U.S.C.section 360bbb-3(b)(1), unless the authorization is terminated  or revoked sooner.       Influenza A by PCR NEGATIVE NEGATIVE Final   Influenza B by PCR NEGATIVE NEGATIVE Final    Comment: (NOTE) The Xpert Xpress SARS-CoV-2/FLU/RSV plus assay is intended as an aid in the diagnosis of influenza from Nasopharyngeal swab specimens and should not be used as a sole basis for treatment. Nasal washings and aspirates are unacceptable for Xpert Xpress SARS-CoV-2/FLU/RSV testing.  Fact Sheet for Patients: EntrepreneurPulse.com.au  Fact Sheet for Healthcare Providers: IncredibleEmployment.be  This test is not yet approved or cleared by the [place] FDA and has been authorized for detection and/or diagnosis of SARS-CoV-2 by FDA under an Emergency Use Authorization (EUA). This EUA will remain in effect (meaning this test can be used) for the duration of the COVID-19 declaration under Section 564(b)(1) of the Act, 21 U.S.C. section 360bbb-3(b)(1), unless the authorization is terminated or revoked.  Performed at [facility], [place] [address] [place], [place] [address]    Surgical PCR Screen     Status: None   Collection Time: 04/04/21  3:45 PM   Specimen: Nasal Mucosa; Nasal Swab  Result Value Ref Range Status   MRSA, PCR NEGATIVE NEGATIVE Final   Staphylococcus aureus NEGATIVE NEGATIVE Final    Comment: (NOTE) The Xpert SA Assay (FDA approved for NASAL specimens in patients 40 years of age and older), is one component of a comprehensive surveillance program. It is not intended to diagnose infection nor to guide or monitor treatment. Performed at [facility], [place] [address] [place], [place] [address]    *Note: Due to a large number of results and/or encounters for the requested time period, some results have not been displayed. A complete set of results can be found in Results Review.    Coagulation Studies: No results for input(s): LABPROT, INR in the last 72 hours.  Urinalysis: No results for input(s): COLORURINE, LABSPEC, PHURINE, GLUCOSEU, HGBUR, BILIRUBINUR, KETONESUR, PROTEINUR, UROBILINOGEN, NITRITE, LEUKOCYTESUR in the last  72 hours.  Invalid input(s): APPERANCEUR    Imaging: DG CHEST PORT 1 VIEW  Result Date: 04/20/2021 CLINICAL DATA:  Shortness of breath EXAM: PORTABLE CHEST 1 VIEW COMPARISON:  04/19/2021 and prior radiographs FINDINGS: UPPER limits normal heart size and CABG changes again noted. LEFT IJ central venous catheter and RIGHT pacemaker/AICD again noted. Bibasilar opacities are unchanged and may represent atelectasis. There may be trace bilateral pleural effusions present. There is no evidence of pneumothorax. IMPRESSION: Unchanged appearance of the chest. Unchanged bibasilar opacities which may represent atelectasis. Possible trace bilateral pleural effusions. Electronically Signed   By: Margarette Canada M.D.   On: 04/20/2021 14:24     Medications:    sodium chloride      allopurinol  100 mg Oral Daily   aspirin  81 mg Oral Daily   calcium acetate  1,334 mg Oral TID WC   Chlorhexidine Gluconate Cloth  6 each Topical  Q0600   Chlorhexidine Gluconate Cloth  6 each Topical Q0600   clopidogrel  75 mg Oral Daily   cycloSPORINE  1 drop Both Eyes BID   darbepoetin (ARANESP) injection - DIALYSIS  100 mcg Intravenous Q Sat-HD   heparin injection (subcutaneous)  5,000 Units Subcutaneous Q8H   insulin aspart  0-6 Units Subcutaneous TID WC   mouth rinse  15 mL Mouth Rinse q12n4p   midodrine  10 mg Oral 3 times per day on Tue Thu Sat   mometasone-formoterol  2 puff Inhalation BID   pantoprazole sodium  40 mg Oral BID   polyethylene glycol  17 g Oral Daily   polyvinyl alcohol  1 drop Both Eyes Q12H   predniSONE  10 mg Oral Q breakfast   rosuvastatin  5 mg Oral QPM   senna  1 tablet Oral BID   sodium chloride flush  10-40 mL Intracatheter Q12H   acetaminophen, albuterol, alum & mag hydroxide-simeth, bisacodyl, guaiFENesin, hydrALAZINE, HYDROcodone-acetaminophen, melatonin, ondansetron (ZOFRAN) IV, simethicone, sodium chloride, sodium chloride flush  Assessment/ Plan:  Acute on chronic kidney disease.  Baseline creatinine about 1.3 mg/dL.  Status post hip fracture now with hypotension and respiratory distress.  Patient placed on CRRT 04/12/2021 This was discontinued on 04/14/2021.  Appears to have poor urine output.  Hemodialysis requested although historically did not tolerate.  She continues a full CODE STATUS.  Next dialysis 3/14 ANEMIA-    add darbepoetin 100 mcg weekly. Will give some iron MBD-at calcium phosphate 2 pills with meals HTN/VOL-   She appears to be tolerating dialysis this point. Essentially anuric Status post hip fracture at 04/03/2021    LOS: [place] '@TODAY''@12'$ :52 PM

## 2021-04-22 DIAGNOSIS — I5032 Chronic diastolic (congestive) heart failure: Secondary | ICD-10-CM | POA: Diagnosis not present

## 2021-04-22 DIAGNOSIS — I10 Essential (primary) hypertension: Secondary | ICD-10-CM | POA: Diagnosis not present

## 2021-04-22 DIAGNOSIS — I442 Atrioventricular block, complete: Secondary | ICD-10-CM | POA: Diagnosis not present

## 2021-04-22 DIAGNOSIS — W19XXXA Unspecified fall, initial encounter: Secondary | ICD-10-CM | POA: Diagnosis not present

## 2021-04-22 LAB — RENAL FUNCTION PANEL
Albumin: 2.1 g/dL — ABNORMAL LOW (ref 3.5–5.0)
Anion gap: 11 (ref 5–15)
BUN: 64 mg/dL — ABNORMAL HIGH (ref 8–23)
CO2: 26 mmol/L (ref 22–32)
Calcium: 8.8 mg/dL — ABNORMAL LOW (ref 8.9–10.3)
Chloride: 93 mmol/L — ABNORMAL LOW (ref 98–111)
Creatinine, Ser: 4.26 mg/dL — ABNORMAL HIGH (ref 0.44–1.00)
GFR, Estimated: 10 mL/min — ABNORMAL LOW (ref >60)
Glucose, Bld: 129 mg/dL — ABNORMAL HIGH (ref 70–99)
Phosphorus: 5.5 mg/dL — ABNORMAL HIGH (ref 2.5–4.6)
Potassium: 4.5 mmol/L (ref 3.5–5.1)
Sodium: 130 mmol/L — ABNORMAL LOW (ref 135–145)

## 2021-04-22 LAB — GLUCOSE, CAPILLARY
Glucose-Capillary: 103 mg/dL — ABNORMAL HIGH (ref 70–99)
Glucose-Capillary: 112 mg/dL — ABNORMAL HIGH (ref 70–99)
Glucose-Capillary: 115 mg/dL — ABNORMAL HIGH (ref 70–99)
Glucose-Capillary: 133 mg/dL — ABNORMAL HIGH (ref 70–99)
Glucose-Capillary: 143 mg/dL — ABNORMAL HIGH (ref 70–99)
Glucose-Capillary: 151 mg/dL — ABNORMAL HIGH (ref 70–99)

## 2021-04-22 LAB — MAGNESIUM: Magnesium: 2.2 mg/dL (ref 1.7–2.4)

## 2021-04-22 MED ORDER — DARBEPOETIN ALFA 100 MCG/0.5ML IJ SOSY
100.0000 ug | PREFILLED_SYRINGE | INTRAMUSCULAR | Status: DC
  Administered 2021-04-23: 100 ug via INTRAVENOUS
  Filled 2021-04-22: qty 0.5

## 2021-04-22 MED ORDER — FLUCONAZOLE 150 MG PO TABS
150.0000 mg | ORAL_TABLET | Freq: Once | ORAL | Status: AC
  Administered 2021-04-22: 150 mg via ORAL
  Filled 2021-04-22: qty 1

## 2021-04-22 MED ORDER — CALCIUM ACETATE (PHOS BINDER) 667 MG PO CAPS
667.0000 mg | ORAL_CAPSULE | Freq: Three times a day (TID) | ORAL | Status: DC
Start: 2021-04-22 — End: 2021-04-28
  Administered 2021-04-22 – 2021-04-28 (×16): 667 mg via ORAL
  Filled 2021-04-22 (×16): qty 1

## 2021-04-22 MED ORDER — OXYCODONE HCL 5 MG PO TABS
ORAL_TABLET | ORAL | Status: AC
  Filled 2021-04-22: qty 1

## 2021-04-22 MED ORDER — BISACODYL 10 MG RE SUPP
10.0000 mg | Freq: Once | RECTAL | Status: AC
  Administered 2021-04-22: 10 mg via RECTAL
  Filled 2021-04-22: qty 1

## 2021-04-22 NOTE — Assessment & Plan Note (Signed)
-  Complaining of back Pain but has not been OOB much -DG Thoracic shows "There is no evidence of thoracic spine fracture. Alignment is normal. Diffuse intervertebral ankylosis is again seen throughout the thoracic spine. Generalized osteopenia noted. Cervical spine fusion hardware again demonstrated." -C/w Pain Control

## 2021-04-22 NOTE — Progress Notes (Signed)
PROGRESS NOTE    [name]  DZH:[id number] DOB: 10-28-1945 DOA: 04/03/2021 PCP: Lujean Amel, MD   Brief Narrative:  The patient is 76 year old obese female with known OSA OHS O2 dependent at 3 L nasal cannula who fell and suffered a fracture left femur.  She developed worsening renal failure while in the hospital required a HD catheter that was tunneled and placed on 04/11/2021 per interventional radiology.  On 04/12/2021 she had worsening respiratory failure she was obtunded and was transferred to intensive care unit for further evaluation and treatment by pulmonary critical care.    Significant Events:   04/12/2021 transferred intensive care unit; CRRT started 3/4: improved mentation  3/5: CRRT stopped 3/6: no UOP, increasing creatinine 3/7: tolerated iHD 3/8: d/c a-line. Transfer to progressive. 3/9 TRH assumed care and Dialysis to be attempted again and continues to have poor UOP 3/10 she was able to be weaned to 8 L salter high flow nasal cannula with no respiratory distress; next dialysis session is tomorrow 3/11 underwent dialysis today had had 1500 mL removed. Had to have Midodrine for BP support. PT/OT recommending SNF and Ortho recommending staple Removal Monday  3/12 patient continued to complain of some abdominal pain and has not had a bowel movement so we will continue bowel regimen and obtain a CT of the abdomen pelvis with oral contrast.  Plan is for dialysis on Tuesday 3/14 now. CT scan of the abdomen pelvis done and showed no acute abnormalities but did show a moderate stool burden.  He was given a bisacodyl suppository yesterday and another 1 today with improvement of her constipation.  She continues to complain of some back pain and there is no evidence of thoracic spine fracture but alignment was normal and she did have some diffuse intervertebral ankylosis seen throughout the thoracic spine and general osteopenia; she is getting close to being discharged to SNF as her  oxygen requirement is weaning down and patient's son has selected [facility]; will need clipping for dialysis     Assessment and Plan: * Closed left hip fracture ([place]) secondary to fall at home -Patient presents after having a fall found to have a left hip and left superior pubic ramus fracture.  Orthopedics consulted with plans to possibly take patient to the operating room in a.m. based off the geriatric perioperative cardiac risk assessment score patient has 16.1% probability of perioperative cardiac arrest or myocardial infarction. -Admitted to a surgical telemetry bed -Hip fracture order set utilized and now s/p ORIF -Hydrocodone/morphine IV as needed pain; Plan is for Staple Removal 04/22/21 -Transitions of care consulted as pateint will need SNF and patient's son has selected [facility]    Back pain -Complaining of back Pain but has not been OOB much -DG Thoracic shows "There is no evidence of thoracic spine fracture. Alignment is normal. Diffuse intervertebral ankylosis is again seen throughout the thoracic spine. Generalized osteopenia noted. Cervical spine fusion hardware again demonstrated." -C/w Pain Control  Hypokalemia -Improved. K+ is now 4.5 -Continue to monitor and trend -Repeat CMP in the AM  Fall at home, initial encounter -Resulted in Hip Fracture and she is s/p ORIF  Chronic kidney disease, stage III B (moderate) ([place]) -On admission creatinine 1.69 which appears slightly above baseline which appears to be 1.3-1.5. -Went into Renal Failure and now had  Acute on chronic renal failure with anuria -Did not tolerate iHD due to hypotension so required CRRT 3/3-3/5.  Tolerated iHD 3/7 with net UF 2L and also 3/9 as  well as 3/11 where she had 1500 mL removed  Primary management per nephrology Last HD done 3/11 with Next HD 3/14 Continue Midodrine 10 mg po TID and will continue   Closed fracture of left superior pubic ramus ([place]) -Secondary to fall.  No surgical  intervention recommended for treatment of this fracture.  Thrombocytopenia ([place]) -Improved. Platelet Count is now stable at 167 on last check and has not been checked in a few days so we will be checking tomorrow -Continue to Monitor for S/Sx of Bleeding; no overt bleeding noted -Repeat CBC in the AM   Respiratory failure (HCC) -Acute on Chronic Hypoxic/Hypercapneic Failure  -Patient has history of obesity hypoventilation syndrome and OSA.  She is chronically on 3 L of oxygen at baseline. -SpO2: 100 % O2 Flow Rate (L/min): 3 L/min FiO2 (%): (S) 35 %; O2 continues to Wean -Suspect this will continue to improve with volume removal from HD as she had 1500 mL removed the day before yesterday and will be undergoing dialysis tomorrow; back on her home O2 requirement  OSA/OHS Wean HHF for O2 saturations 88-92% BiPAP qHS Will need home BiPAP set up prior to discharge and will need either a NIV or BiPAP for discharge  Diabetes mellitus without complication (HCC) Last hemoglobin A1c was 6.5 less than 3 months ago.  Patient is on Jardiance 10 mg daily. -Hypoglycemic protocols -Held Jardiance -CBGs before every meal with sensitive SSI -Adjust insulin regimen as needed -CBGs ranging from 103-143 now  CHB (complete heart block) (HCC) S/p PPM/ICD   Chronic diastolic CHF (congestive heart failure) (HCC) -Chronic.  Last echocardiogram noted EF of 55 to 60% with grade 1 diastolic dysfunction in 08/6732.  BNP improved to 333.8 from 712 on 2/6.  Patient has some mild lower extremity edema but does not appear grossly fluid overloaded.  She had been seen by her cardiologist Dr. Burt Knack on 2/20 and recommended to increase Torsemide to 80 mg daily but now stoped due to soft BP and because patient getting Dialysis.  -Strict I&Os and daily weights; She is -10.190 Liters -Continued current dose of Torsemide but no longer on it and getting Volume Mainteance with HD  S/P CABG x 5 -Last cardiac catheterization  was in 02/2019 where all territories were noted to have adequate coronary flow via bypasses or native vessels with stents. -Continue aspirin, Plavix, and Crestor -BB and Isororbide held due to Hypotension earlier but may resume slowly  Abdominal pain -Continues to have Abdominal Pain and states she hasn't had a bowel movement; Bowel regimen adjusted  -Obtain a CT Abd/Pelvis w/ Oral Contrast and showed "No acute findings in the abdomen or pelvis. Trace bilateral effusions, bibasilar atelectasis. Aortic atherosclerosis. Colonic diverticulosis." Did show a Moderate Stool burden -Abdominal Pain is improving   Mixed hypercholesterolemia and hypertriglyceridemia -Continue Crestor  HTN (hypertension), benign -Home medication regimen includes bisoprolol 7.5 mg daily, Imdur 90 mg daily, torsemide 80 mg daily which were held due to her softer BP -GDMT for Chronic Diastolic Heart Failure held due to Hypotension and will resume as Able but BP still on the soft side    ANEMIA -Hemoglobin 10.8 g/dL which appears around lower end of patient's baseline which ranges from 10- 12 g/dL. -Now Hgb/Hct went from 8.7/27.1 -> 9.5/30.1 -> 9.0/29.6 -> 8.6/27.5 on last check and will repeat in the AM  Anemia Panel showed iron level of 39, U IBC of 290, TIBC of 329, saturation ratios of 12%, ferritin level 206 -Continue to monitor H&H  and continue to Monitor for S/Sx of Bleeding -Repeat CBC in the AM  DVT prophylaxis: heparin injection 5,000 Units Start: 04/15/21 1400    Code Status: Full Code Family Communication: Discussed with Family present at bedside   Disposition Plan:  Level of care: Progressive Status is: Inpatient Remains inpatient appropriate because: Needs SNF and CLIP for Dialysis    Consultants:  PCCM Transfer Cardiology Nephrology Orthopedic Surgery  Procedures:  As Above  Antimicrobials:  Anti-infectives (From admission, onward)    Start     Dose/Rate Route Frequency Ordered Stop    04/22/21 1530  fluconazole (DIFLUCAN) tablet 150 mg        150 mg Oral  Once 04/22/21 1430 04/22/21 1645   04/11/21 1530  vancomycin (VANCOCIN) IVPB 1000 mg/200 mL premix        1,000 mg 200 mL/hr over 60 Minutes Intravenous  Once 04/11/21 1432 04/12/21 1952   04/04/21 1423  vancomycin (VANCOCIN) powder  Status:  Discontinued          As needed 04/04/21 1424 04/04/21 1551   04/04/21 1200  vancomycin (VANCOREADY) IVPB 1500 mg/300 mL        1,500 mg 150 mL/hr over 120 Minutes Intravenous On call to O.R. 04/03/21 1241 04/04/21 1529   04/04/21 1145  ceFAZolin (ANCEF) IVPB 2g/100 mL premix  Status:  Discontinued        2 g 200 mL/hr over 30 Minutes Intravenous On call to O.R. 04/04/21 5277 04/04/21 1717        Subjective: Seen and examined at bedside and states that her abdominal pain is improving and she had a large bowel movement yesterday.  No nausea or vomiting.  Still complains of back pain.  Denies any lightheadedness or dizziness.  Her oxygen has been weaned to 3 L back to her home dose.  No other concerns or complaints at this time.  Objective: Vitals:   04/22/21 0231 04/22/21 0417 04/22/21 0738 04/22/21 0814  BP:  (!) 103/47 (!) 112/48   Pulse:  74 72   Resp:  (!) 24 (!) 23   Temp:  98.3 F (36.8 C) 97.9 F (36.6 C)   TempSrc:  Axillary Oral   SpO2:  97% 100% 100%  Weight: 97.4 kg     Height:        Intake/Output Summary (Last 24 hours) at 04/22/2021 1847 Last data filed at 04/22/2021 1700 Gross per 24 hour  Intake 810 ml  Output 50 ml  Net 760 ml   Filed Weights   04/20/21 0810 04/20/21 1152 04/22/21 0231  Weight: 98 kg 96.2 kg 97.4 kg   Examination: Physical Exam:  Constitutional: WN/WD obese chronically ill-appearing Caucasian female currently no acute distress appears calm Respiratory: Diminished to auscultation bilaterally with coarse breath sounds, no wheezing, rales, rhonchi or crackles. Normal respiratory effort and patient is not tachypenic. No accessory  muscle use.  Wearing supplemental oxygen via nasal cannula and now back to her 3 L home O2 regimen Cardiovascular: RRR, no murmurs / rubs / gallops. S1 and S2 auscultated.  1+ lower extremity edema Abdomen: Soft, non-tender, distended secondary body habitus. Bowel sounds positive.  GU: Deferred. Musculoskeletal: No clubbing / cyanosis of digits/nails. No joint deformity upper and lower extremities.  Neurologic: CN 2-12 grossly intact with no focal deficits.   Data Reviewed: I have personally reviewed following labs and imaging studies  CBC: Recent Labs  Lab 04/18/21 1002 04/19/21 0533 04/19/21 2100 04/20/21 0322  WBC 10.8* 11.2* 8.7 9.5  NEUTROABS 8.9* 9.4*  --  7.5  HGB 8.7* 9.5* 9.0* 8.6*  HCT 27.1* 30.1* 29.6* 27.5*  MCV 93.1 93.2 95.2 94.8  PLT 177 178 174 700   Basic Metabolic Panel: Recent Labs  Lab 04/18/21 0315 04/19/21 0533 04/19/21 2057 04/20/21 0322 04/21/21 0049 04/22/21 0433  NA 132* 133* 134* 133* 133* 130*  K 4.0 3.7 4.2 4.1 4.0 4.5  CL 94* 96* 95* 94* 96* 93*  CO2 25 21* '25 26 25 26  '$ GLUCOSE 119* 122* 171* 95 117* 129*  BUN 69* 43* 61* 67* 38* 64*  CREATININE 3.67* 2.86* 3.86* 4.14* 2.96* 4.26*  CALCIUM 9.1 8.6* 8.8* 8.8* 8.8* 8.8*  MG 2.3 2.1  --  2.3 2.2 2.2  PHOS 3.3 3.2 4.8* 6.1* 5.4* 5.5*   GFR: Estimated Creatinine Clearance: 12.3 mL/min (A) (by C-G formula based on SCr of 4.26 mg/dL (H)). Liver Function Tests: Recent Labs  Lab 04/18/21 0331 04/19/21 0533 04/19/21 2057 04/20/21 0322 04/21/21 0049 04/22/21 0433  AST 22 26  --  25  --   --   ALT 23 22  --  23  --   --   ALKPHOS 136* 150*  --  142*  --   --   BILITOT 0.7 1.0  --  0.8  --   --   PROT 6.4* 6.8  --  6.7  --   --   ALBUMIN 2.0* 2.2* 2.1* 2.2* 2.1* 2.1*   No results for input(s): LIPASE, AMYLASE in the last 168 hours. No results for input(s): AMMONIA in the last 168 hours. Coagulation Profile: No results for input(s): INR, PROTIME in the last 168 hours. Cardiac  Enzymes: No results for input(s): CKTOTAL, CKMB, CKMBINDEX, TROPONINI in the last 168 hours. BNP (last 3 results) No results for input(s): PROBNP in the last 8760 hours. HbA1C: No results for input(s): HGBA1C in the last 72 hours. CBG: Recent Labs  Lab 04/21/21 2017 04/22/21 0013 04/22/21 0741 04/22/21 1206 04/22/21 1640  GLUCAP 152* 115* 112* 103* 143*   Lipid Profile: No results for input(s): CHOL, HDL, LDLCALC, TRIG, CHOLHDL, LDLDIRECT in the last 72 hours. Thyroid Function Tests: No results for input(s): TSH, T4TOTAL, FREET4, T3FREE, THYROIDAB in the last 72 hours. Anemia Panel: Recent Labs    04/21/21 0049  FERRITIN 206  TIBC 329  IRON 39   Sepsis Labs: No results for input(s): PROCALCITON, LATICACIDVEN in the last 168 hours.  No results found for this or any previous visit (from the past 240 hour(s)).   Radiology Studies: CT ABDOMEN PELVIS WO CONTRAST  Result Date: 04/21/2021 CLINICAL DATA:  Generalized abdominal pain EXAM: CT ABDOMEN AND PELVIS WITHOUT CONTRAST TECHNIQUE: Multidetector CT imaging of the abdomen and pelvis was performed following the standard protocol without IV contrast. RADIATION DOSE REDUCTION: This exam was performed according to the departmental dose-optimization program which includes automated exposure control, adjustment of the mA and/or kV according to patient size and/or use of iterative reconstruction technique. COMPARISON:  09/04/2020 FINDINGS: Lower chest: Bibasilar opacities, likely atelectasis. Trace bilateral effusions. Pacer wires in the right heart. Aortic atherosclerosis. Hepatobiliary: No focal liver abnormality is seen. Status post cholecystectomy. No biliary dilatation. Pancreas: No focal abnormality or ductal dilatation. Spleen: No focal abnormality.  Normal size. Adrenals/Urinary Tract: Small exophytic lesion off the lower pole of the right kidney measures 1.5 cm, stable since prior study, likely hemorrhagic cyst. Small exophytic cyst  off the midpole of the left kidney, unchanged. No hydronephrosis. No renal or ureteral  stones. Adrenal glands and urinary bladder unremarkable. Stomach/Bowel: Colonic diverticulosis. Moderate stool burden throughout the colon. Stomach and small bowel decompressed. Vascular/Lymphatic: Aortic atherosclerosis. Reproductive: Uterus and adnexa unremarkable.  No mass. Other: No free fluid or free air. Musculoskeletal: No acute bony abnormality. Diffuse degenerative changes throughout the lumbar spine. Left femoral intertrochanteric fracture status post internal fixation. IMPRESSION: No acute findings in the abdomen or pelvis. Trace bilateral effusions, bibasilar atelectasis. Aortic atherosclerosis. Colonic diverticulosis. Electronically Signed   By: Rolm Baptise M.D.   On: 04/21/2021 17:08   DG Thoracic Spine 2 View  Result Date: 04/21/2021 CLINICAL DATA:  Fall.  Thoracic back pain. EXAM: THORACIC SPINE 2 VIEWS COMPARISON:  None. FINDINGS: There is no evidence of thoracic spine fracture. Alignment is normal. Diffuse intervertebral ankylosis is again seen throughout the thoracic spine. Generalized osteopenia noted. Cervical spine fusion hardware again demonstrated. IMPRESSION: No acute findings. Diffuse thoracic intervertebral ankylosis and osteopenia. Electronically Signed   By: Marlaine Hind M.D.   On: 04/21/2021 13:31     Scheduled Meds:  allopurinol  100 mg Oral Daily   aspirin  81 mg Oral Daily   calcium acetate  667 mg Oral TID WC   Chlorhexidine Gluconate Cloth  6 each Topical Q0600   clopidogrel  75 mg Oral Daily   cycloSPORINE  1 drop Both Eyes BID   [START ON 04/23/2021] darbepoetin (ARANESP) injection - DIALYSIS  100 mcg Intravenous Q Tue-HD   heparin injection (subcutaneous)  5,000 Units Subcutaneous Q8H   insulin aspart  0-6 Units Subcutaneous TID WC   mouth rinse  15 mL Mouth Rinse q12n4p   midodrine  10 mg Oral 3 times per day on Tue Thu Sat   mometasone-formoterol  2 puff Inhalation BID    oxyCODONE       pantoprazole sodium  40 mg Oral BID   polyethylene glycol  17 g Oral BID   polyvinyl alcohol  1 drop Both Eyes Q12H   predniSONE  10 mg Oral Q breakfast   rosuvastatin  5 mg Oral QPM   senna-docusate  1 tablet Oral BID   sodium chloride flush  10-40 mL Intracatheter Q12H   Continuous Infusions:  sodium chloride      LOS: 19 days   [name], DO [facility] Available via Epic secure chat 7am-7pm After these hours, please refer to coverage provider listed on [email or web address] 04/22/2021, 6:47 PM

## 2021-04-22 NOTE — TOC Progression Note (Signed)
Transition of Care (TOC) - Progression Note  ? ? ?Patient Details  ?Name: [name] ?MRN: [id number] ?Date of Birth: [birth year] ? ?Transition of Care (TOC) CM/SW Contact  ?Bartholomew Crews, RN ?Phone Number: [phone] ?04/22/2021, 1:21 PM ? ?Clinical Narrative:    ? ?Spoke with patient and son, [name], at the bedside this morning to discuss post acute transition. Patient is agreeable to SNF with first choice of [facility]. Advised of needing bipap which can be arranged for home when she transitions home from SNF. Patient currently has home oxygen through [place].  ? ?Sp[facility] with Ash[facility] at [facility] to confirm home oxygen and advised of NIV needs. Advised that d/t pattern of insurance behavior patient may require overnight oximetry on regular O2 liter flow for auto bipap qualification.  ? ?PTA patient had been living alone with support of family. Family typically assists with medical transportation, however, patient does have Medicaid transportation that she has used a couple times recently.  ? ?Discussed that patient is currently requiring intermittent HD, and if needed outpatient that this will need to be set up prior to transition to SNF or home.  ? ?Patient likely to transition home with son, [name], upon competion of SNF care. Mark's address is [address] [place].  ? ?Discussed PCS benefit through Medicaid. Advised that this is typically handled through PCP and can take several weeks. However, the process can be expedited from hospital but services may still take several days to be put into place. Advised that a discharge date needs to be determined before this can be expedited.  ? ?Discussed [place] services for skilled care covered by Medicare. No preference for agency. Advised that SNF will make these arrangements.  ? ?Current DME includes rollator, walker, and 3/1. Patient will need hospital bed and wheelchair upon transition from SNF. Advised that SNF will make these arrangements.  ? ?TOC following for  transition needs.  ? ?Expected Discharge Plan: [place] ?Barriers to Discharge: Continued Medical Work up, SNF Pending bed offer ? ?Expected Discharge Plan and Services ?Expected Discharge Plan: [place] ?In-house Referral: Clinical Social Work ?  ?Post Acute Care Choice: [place] ?Living arrangements for the past 2 months: [place] ?                ?  ?  ?  ?  ?  ?  ?  ?  ?  ?  ? ? ?Social Determinants of Health (SDOH) Interventions ?  ? ?Readmission Risk Interventions ?Readmission Risk Prevention Plan 01/10/2021  ?Transportation Screening Complete  ?PCP or Specialist Appt within 3-5 Days Complete  ?[place] or Home Care Consult Complete  ?Social Work Consult for [place] Planning/Counseling Complete  ?Palliative Care Screening Not Applicable  ?Medication Review Press photographer) Complete  ?Some recent data might be hidden  ? ? ?

## 2021-04-22 NOTE — Progress Notes (Signed)
?[facility] ?Progress Note  ? ?Subjective:   Has little UOP but dysuria and frequency.  Incontinent of urine.  Also vaginal pruritis and irritation. . I/Os 400 / NR. Wt 97.4 from 96.2.  Cr 2. 96 > 4.3.  ? ?Objective ?Vitals:  ? 04/22/21 0231 04/22/21 0417 04/22/21 0738 04/22/21 0814  ?BP:  (!) 103/47 (!) 112/48   ?Pulse:  74 72   ?Resp:  (!) 24 (!) 23   ?Temp:  98.3 ?F (36.8 ?C) 97.9 ?F (36.6 ?C)   ?TempSrc:  Axillary Oral   ?SpO2:  97% 100% 100%  ?Weight: 97.4 kg     ?Height:      ? ?Physical Exam ?General: obese woman lying in bed looking tired but nontoxic ?Heart: RRR ?Lungs: clear ?Abdomen: soft, obese ?Extremities: 1+ edema ?Dialysis Access:  [facility] c/d/I some bruising but no erythema ? ?Additional Objective ?Labs: ?Basic Metabolic Panel: ?Recent Labs  ?Lab 04/20/21 ?0322 04/21/21 ?1031 04/22/21 ?0433  ?NA 133* 133* 130*  ?K 4.1 4.0 4.5  ?CL 94* 96* 93*  ?CO2 '26 25 26  '$ ?GLUCOSE 95 117* 129*  ?BUN 67* 38* 64*  ?CREATININE 4.14* 2.96* 4.26*  ?CALCIUM 8.8* 8.8* 8.8*  ?PHOS 6.1* 5.4* 5.5*  ? ?Liver Function Tests: ?Recent Labs  ?Lab 04/18/21 ?0331 04/19/21 ?5945 04/19/21 ?2057 04/20/21 ?0322 04/21/21 ?8592 04/22/21 ?0433  ?AST 22 26  --  25  --   --   ?ALT 23 22  --  23  --   --   ?ALKPHOS 136* 150*  --  142*  --   --   ?BILITOT 0.7 1.0  --  0.8  --   --   ?PROT 6.4* 6.8  --  6.7  --   --   ?ALBUMIN 2.0* 2.2*   < > 2.2* 2.1* 2.1*  ? < > = values in this interval not displayed.  ? ?No results for input(s): LIPASE, AMYLASE in the last 168 hours. ?CBC: ?Recent Labs  ?Lab 04/18/21 ?1002 04/19/21 ?9244 04/19/21 ?2100 04/20/21 ?6286  ?WBC 10.8* 11.2* 8.7 9.5  ?NEUTROABS 8.9* 9.4*  --  7.5  ?HGB 8.7* 9.5* 9.0* 8.6*  ?HCT 27.1* 30.1* 29.6* 27.5*  ?MCV 93.1 93.2 95.2 94.8  ?PLT 177 178 174 167  ? ?Blood Culture ?   ?Component Value Date/Time  ? SDES SPUTUM 05/02/2018 0459  ? [place] NONE 05/02/2018 0459  ? CULT MULTIPLE SPECIES PRESENT, SUGGEST RECOLLECTION (A) 10/23/2015 1527  ? REPTSTATUS 05/02/2018  FINAL 05/02/2018 0459  ? ? ?Cardiac Enzymes: ?No results for input(s): CKTOTAL, CKMB, CKMBINDEX, TROPONINI in the last 168 hours. ?CBG: ?Recent Labs  ?Lab 04/21/21 ?1731 04/21/21 ?2017 04/22/21 ?0013 04/22/21 ?3817 04/22/21 ?1206  ?GLUCAP 164* 152* 115* 112* 103*  ? ?Iron Studies:  ?Recent Labs  ?  04/21/21 ?7116  ?IRON 39  ?TIBC 329  ?FERRITIN 206  ? ?'@lablastinr3'$ @ ?Studies/Results: ?CT ABDOMEN PELVIS WO CONTRAST ? ?Result Date: 04/21/2021 ?CLINICAL DATA:  Generalized abdominal pain EXAM: CT ABDOMEN AND PELVIS WITHOUT CONTRAST TECHNIQUE: Multidetector CT imaging of the abdomen and pelvis was performed following the standard protocol without IV contrast. RADIATION DOSE REDUCTION: This exam was performed according to the departmental dose-optimization program which includes automated exposure control, adjustment of the mA and/or kV according to patient size and/or use of iterative reconstruction technique. COMPARISON:  09/04/2020 FINDINGS: Lower chest: Bibasilar opacities, likely atelectasis. Trace bilateral effusions. Pacer wires in the right heart. Aortic atherosclerosis. Hepatobiliary: No focal liver abnormality is seen. Status post cholecystectomy.  No biliary dilatation. Pancreas: No focal abnormality or ductal dilatation. Spleen: No focal abnormality.  Normal size. Adrenals/Urinary Tract: Small exophytic lesion off the lower pole of the right kidney measures 1.5 cm, stable since prior study, likely hemorrhagic cyst. Small exophytic cyst off the midpole of the left kidney, unchanged. No hydronephrosis. No renal or ureteral stones. Adrenal glands and urinary bladder unremarkable. Stomach/Bowel: Colonic diverticulosis. Moderate stool burden throughout the colon. Stomach and small bowel decompressed. Vascular/Lymphatic: Aortic atherosclerosis. Reproductive: Uterus and adnexa unremarkable.  No mass. Other: No free fluid or free air. Musculoskeletal: No acute bony abnormality. Diffuse degenerative changes throughout  the lumbar spine. Left femoral intertrochanteric fracture status post internal fixation. IMPRESSION: No acute findings in the abdomen or pelvis. Trace bilateral effusions, bibasilar atelectasis. Aortic atherosclerosis. Colonic diverticulosis. Electronically Signed   By: Rolm Baptise M.D.   On: 04/21/2021 17:08  ? ?DG Thoracic Spine 2 View ? ?Result Date: 04/21/2021 ?CLINICAL DATA:  Fall.  Thoracic back pain. EXAM: THORACIC SPINE 2 VIEWS COMPARISON:  None. FINDINGS: There is no evidence of thoracic spine fracture. Alignment is normal. Diffuse intervertebral ankylosis is again seen throughout the thoracic spine. Generalized osteopenia noted. Cervical spine fusion hardware again demonstrated. IMPRESSION: No acute findings. Diffuse thoracic intervertebral ankylosis and osteopenia. Electronically Signed   By: Marlaine Hind M.D.   On: 04/21/2021 13:31  ? ?DG CHEST PORT 1 VIEW ? ?Result Date: 04/20/2021 ?CLINICAL DATA:  Shortness of breath EXAM: PORTABLE CHEST 1 VIEW COMPARISON:  04/19/2021 and prior radiographs FINDINGS: UPPER limits normal heart size and CABG changes again noted. LEFT IJ central venous catheter and RIGHT pacemaker/AICD again noted. Bibasilar opacities are unchanged and may represent atelectasis. There may be trace bilateral pleural effusions present. There is no evidence of pneumothorax. IMPRESSION: Unchanged appearance of the chest. Unchanged bibasilar opacities which may represent atelectasis. Possible trace bilateral pleural effusions. Electronically Signed   By: Margarette Canada M.D.   On: 04/20/2021 14:24   ?Medications: ? sodium chloride    ? ? allopurinol  100 mg Oral Daily  ? aspirin  81 mg Oral Daily  ? calcium acetate  1,334 mg Oral TID WC  ? Chlorhexidine Gluconate Cloth  6 each Topical Q0600  ? clopidogrel  75 mg Oral Daily  ? cycloSPORINE  1 drop Both Eyes BID  ? darbepoetin (ARANESP) injection - DIALYSIS  100 mcg Intravenous Q Sat-HD  ? heparin injection (subcutaneous)  5,000 Units Subcutaneous Q8H   ? insulin aspart  0-6 Units Subcutaneous TID WC  ? mouth rinse  15 mL Mouth Rinse q12n4p  ? midodrine  10 mg Oral 3 times per day on Tue Thu Sat  ? mometasone-formoterol  2 puff Inhalation BID  ? pantoprazole sodium  40 mg Oral BID  ? polyethylene glycol  17 g Oral BID  ? polyvinyl alcohol  1 drop Both Eyes Q12H  ? predniSONE  10 mg Oral Q breakfast  ? rosuvastatin  5 mg Oral QPM  ? senna-docusate  1 tablet Oral BID  ? sodium chloride flush  10-40 mL Intracatheter Q12H  ? ? ?Assessment/Plan: ?**AKI on CKD3:  baseline Cr in the 1.4-1.8 range now with anuric severe AKI following THR.  Renal US 2/25 CKD w/o obstruction. Thought to be ATN in the setting of post op hypotension.  CRRT 3/2 then converted to HD and continues to require ongoing HD.  Last was 3/11, plan next 3/14.  Appears had some issue tolerating iHD but did ok last treatment. Now on midodrine.  Daily labs eval for recovery.  CLIP AKI.  ? ?**Anemia:  Hb in 8-9s, sat 12% so IV iron and ESA ordered.   ? ?**hyponatremia:  mild, hypervolemic + impaired free water excretion with anuric AKI, stable with HD.  ? ?**BMM:  ca and phos ok - on  binder.  Check PTH.  ? ?**S/p hip fracture - operative repair 2/22 ? ?**Dysuria: I/O cath for urine culture.   ? ?**Vaginitis:  fluconazole '150mg'$  x 1 dose.  ? ?Jannifer Hick MD ?04/22/2021, 12:49 PM  ?[facility] ?Pager: [phone] ? ? ?

## 2021-04-22 NOTE — Progress Notes (Addendum)
Requested to see pt for out-pt HD needs. Chart reviewed and plan for snf noted. Will need to CLIP pt once snf is known. Will assist with out-pt HD needs.  ? ?Melven Sartorius ?Renal Navigator ?[phone] ? ?Addendum at 4:42 pm: ?Spoke to son, [name], via phone. Son confirms pt is interested in snf at [place] and then home with son in [place]. Will proceed with clipping pt to local HD clinics in the area that snf may transport to. Referral made to [facility] admissions today. Will assist as needed.  ? ?

## 2021-04-22 NOTE — Progress Notes (Signed)
RT Note:  patient refused BIPAP/CPAP for HS use.  ?

## 2021-04-22 NOTE — Progress Notes (Signed)
Physical Therapy Treatment ?Patient Details ?Name: [name] ?MRN: [id number] ?DOB: May 21, 1945 ?Today's Date: 04/22/2021 ? ? ?History of Present Illness Pt is a 76 y.o. female admitted 04/03/21 after fall at home when leg gave out sustaining L displaced intertrochanteric hip fx. S/p L hip cephalomedullary nail and closed management of superior pubic ramus fx 2/23. On 3/3, pt with worsening respiratory status and encephalopathy, transferred to ICU and started on BiPAP. PMH includes afib, CAD, CHF, HTN, pacemaker, asthma, DM2, stroke, OSA, cervical sx, back sx, R ankle fx. ? ?  ?PT Comments  ? ? Pt progressing towards physical therapy goals. Was able to transition bed>chair with [facility] and +1 assist this session. Pt motivated to participate however fatigued quickly with OOB mobility. Reports pain at sacrum in sitting - worse when LE's elevated, so feet propped up on pillow on the floor. Will continue to follow and progress as able per POC.  ?   ?Recommendations for follow up therapy are one component of a multi-disciplinary discharge planning process, led by the attending physician.  Recommendations may be updated based on patient status, additional functional criteria and insurance authorization. ? ?Follow Up Recommendations ? Skilled nursing-short term rehab (<3 hours/day) ?  ?  ?Assistance Recommended at Discharge Frequent or constant Supervision/Assistance  ?Patient can return home with the following Two people to help with walking and/or transfers;Two people to help with bathing/dressing/bathroom;Assistance with cooking/housework;Assist for transportation;Help with stairs or ramp for entrance ?  ?Equipment Recommendations ? Rolling walker (2 wheels);BSC/3in1;Wheelchair (measurements PT);Wheelchair cushion (measurements PT);Hospital bed  ?  ?Recommendations for Other Services   ? ? ?  ?Precautions / Restrictions Precautions ?Precautions: Fall;Other (comment) ?Precaution Comments: Wears 3L O2 baseline, on  HHFNC ?Restrictions ?Weight Bearing Restrictions: Yes ?LLE Weight Bearing: Weight bearing as tolerated  ?  ? ?Mobility ? Bed Mobility ?Overal bed mobility: Needs Assistance ?Bed Mobility: Supine to Sit, Rolling ?Rolling: Min assist ?  ?Supine to sit: Mod assist, HOB elevated ?  ?  ?General bed mobility comments: Assist for LE advancement off EOB once in sidelying, and assist for trunk elevation to full sitting position. ?  ? ?Transfers ?Overall transfer level: Needs assistance ?Equipment used: Ambulation equipment used ?Transfers: Sit to/from Stand, Bed to chair/wheelchair/BSC ?Sit to Stand: Mod assist, From elevated surface, Max assist ?  ?  ?  ?  ?  ?General transfer comment: Charlaine Dalton utilized for power up to full stand. Initially requiring mod assist progressing to max assist by end of session as pt fatigued. Pt unable to achieve full upright posture. Trunk grossly flexed. ?Transfer via Lift Equipment: Stedy ? ?Ambulation/Gait ?  ?  ?  ?  ?  ?  ?  ?General Gait Details: Unable to progress to gait training. ? ? ?Stairs ?  ?  ?  ?  ?  ? ? ?Wheelchair Mobility ?  ? ?Modified Rankin (Stroke Patients Only) ?  ? ? ?  ?Balance Overall balance assessment: Needs assistance ?Sitting-balance support: Feet supported, Bilateral upper extremity supported ?Sitting balance-Leahy Scale: Fair ?  ?Postural control: Right lateral lean ?Standing balance support: Bilateral upper extremity supported, During functional activity ?Standing balance-Leahy Scale: Zero ?Standing balance comment: required use of lift equiptment to stand. ?  ?  ?  ?  ?  ?  ?  ?  ?  ?  ?  ?  ? ?  ?Cognition Arousal/Alertness: Awake/alert ?Behavior During Therapy: Flat affect ?Overall Cognitive Status: Impaired/Different from baseline ?Area of Impairment: Following commands, Safety/judgement, Awareness, Problem solving ?  ?  ?  ?  ?  ?  ?  ?  ?  ?  ?  ?  Following Commands: Follows one step commands with increased time, Follows multi-step commands with increased  time ?Safety/Judgement: Decreased awareness of safety, Decreased awareness of deficits ?Awareness: Emergent ?Problem Solving: Slow processing, Decreased initiation, Difficulty sequencing, Requires verbal cues, Requires tactile cues ?  ?  ?  ? ?  ?Exercises General Exercises - Lower Extremity ?Long Arc Quad: 10 reps (5 eccentric lower, 5 concentric) ?Hip ABduction/ADduction: 10 reps (isometric pillow squeeze) ? ?  ?General Comments   ?  ?  ? ?Pertinent Vitals/Pain Pain Assessment ?Pain Assessment: Faces ?Faces Pain Scale: Hurts even more ?Pain Location: chronic back pain, LLE, sacrum ?Pain Descriptors / Indicators: Discomfort, Grimacing, Guarding, Sore ?Pain Intervention(s): Limited activity within patient's tolerance, Monitored during session, Repositioned  ? ? ?Home Living   ?  ?  ?  ?  ?  ?  ?  ?  ?  ?   ?  ?Prior Function    ?  ?  ?   ? ?PT Goals (current goals can now be found in the care plan section) Acute Rehab PT Goals ?Patient Stated Goal: Be able to get up by herself ?PT Goal Formulation: With patient ?Time For Goal Achievement: 05/06/21 ?Potential to Achieve Goals: Good ?Progress towards PT goals: Progressing toward goals ? ?  ?Frequency ? ? ? Min 2X/week ? ? ? ?  ?PT Plan Current plan remains appropriate  ? ? ?Co-evaluation   ?  ?  ?  ?  ? ?  ?AM-PAC PT "6 Clicks" Mobility   ?Outcome Measure ? Help needed turning from your back to your side while in a flat bed without using bedrails?: A Little ?Help needed moving from lying on your back to sitting on the side of a flat bed without using bedrails?: A Lot ?Help needed moving to and from a bed to a chair (including a wheelchair)?: Total ?Help needed standing up from a chair using your arms (e.g., wheelchair or bedside chair)?: Total ?Help needed to walk in hospital room?: Total ?Help needed climbing 3-5 steps with a railing? : Total ?6 Click Score: 9 ? ?  ?End of Session Equipment Utilized During Treatment: Oxygen ?Activity Tolerance: Patient tolerated  treatment well;Patient limited by fatigue ?Patient left: in chair;with call bell/phone within reach;with chair alarm set;with family/visitor present ?Nurse Communication: Mobility status ?PT Visit Diagnosis: Other abnormalities of gait and mobility (R26.89);Muscle weakness (generalized) (M62.81);Pain ?Pain - Right/Left: Left ?Pain - part of body: Leg ?  ? ? ?Time: 2706-2376 ?PT Time Calculation (min) (ACUTE ONLY): 40 min ? ?Charges:  $Therapeutic Activity: 38-52 mins          ?          ? ?Rolinda Roan, PT, DPT ?Acute Rehabilitation Services ?Pager: [phone] ?Office: [phone]  ? ? ?Thelma Comp ?04/22/2021, 4:29 PM ? ?

## 2021-04-23 ENCOUNTER — Inpatient Hospital Stay (HOSPITAL_COMMUNITY): Payer: Medicare Other

## 2021-04-23 DIAGNOSIS — I5032 Chronic diastolic (congestive) heart failure: Secondary | ICD-10-CM | POA: Diagnosis not present

## 2021-04-23 DIAGNOSIS — W19XXXA Unspecified fall, initial encounter: Secondary | ICD-10-CM | POA: Diagnosis not present

## 2021-04-23 DIAGNOSIS — I10 Essential (primary) hypertension: Secondary | ICD-10-CM | POA: Diagnosis not present

## 2021-04-23 DIAGNOSIS — I442 Atrioventricular block, complete: Secondary | ICD-10-CM | POA: Diagnosis not present

## 2021-04-23 LAB — CBC WITH DIFFERENTIAL/PLATELET
Abs Immature Granulocytes: 0.1 10*3/uL — ABNORMAL HIGH (ref 0.00–0.07)
Basophils Absolute: 0 10*3/uL (ref 0.0–0.1)
Basophils Relative: 0 %
Eosinophils Absolute: 0 10*3/uL (ref 0.0–0.5)
Eosinophils Relative: 0 %
HCT: 25.8 % — ABNORMAL LOW (ref 36.0–46.0)
Hemoglobin: 8.1 g/dL — ABNORMAL LOW (ref 12.0–15.0)
Immature Granulocytes: 1 %
Lymphocytes Relative: 7 %
Lymphs Abs: 0.7 10*3/uL (ref 0.7–4.0)
MCH: 30.3 pg (ref 26.0–34.0)
MCHC: 31.4 g/dL (ref 30.0–36.0)
MCV: 96.6 fL (ref 80.0–100.0)
Monocytes Absolute: 0.5 10*3/uL (ref 0.1–1.0)
Monocytes Relative: 5 %
Neutro Abs: 7.8 10*3/uL — ABNORMAL HIGH (ref 1.7–7.7)
Neutrophils Relative %: 87 %
Platelets: 138 10*3/uL — ABNORMAL LOW (ref 150–400)
RBC: 2.67 MIL/uL — ABNORMAL LOW (ref 3.87–5.11)
RDW: 17.6 % — ABNORMAL HIGH (ref 11.5–15.5)
WBC: 9.1 10*3/uL (ref 4.0–10.5)
nRBC: 0 % (ref 0.0–0.2)

## 2021-04-23 LAB — COMPREHENSIVE METABOLIC PANEL
ALT: 18 U/L (ref 0–44)
AST: 13 U/L — ABNORMAL LOW (ref 15–41)
Albumin: 2.3 g/dL — ABNORMAL LOW (ref 3.5–5.0)
Alkaline Phosphatase: 135 U/L — ABNORMAL HIGH (ref 38–126)
Anion gap: 11 (ref 5–15)
BUN: 82 mg/dL — ABNORMAL HIGH (ref 8–23)
CO2: 26 mmol/L (ref 22–32)
Calcium: 9 mg/dL (ref 8.9–10.3)
Chloride: 89 mmol/L — ABNORMAL LOW (ref 98–111)
Creatinine, Ser: 5.36 mg/dL — ABNORMAL HIGH (ref 0.44–1.00)
GFR, Estimated: 8 mL/min — ABNORMAL LOW (ref >60)
Glucose, Bld: 105 mg/dL — ABNORMAL HIGH (ref 70–99)
Potassium: 5.4 mmol/L — ABNORMAL HIGH (ref 3.5–5.1)
Sodium: 126 mmol/L — ABNORMAL LOW (ref 135–145)
Total Bilirubin: 0.4 mg/dL (ref 0.3–1.2)
Total Protein: 6.2 g/dL — ABNORMAL LOW (ref 6.5–8.1)

## 2021-04-23 LAB — MAGNESIUM: Magnesium: 2.5 mg/dL — ABNORMAL HIGH (ref 1.7–2.4)

## 2021-04-23 LAB — GLUCOSE, CAPILLARY
Glucose-Capillary: 101 mg/dL — ABNORMAL HIGH (ref 70–99)
Glucose-Capillary: 89 mg/dL (ref 70–99)
Glucose-Capillary: 116 mg/dL — ABNORMAL HIGH (ref 70–99)
Glucose-Capillary: 150 mg/dL — ABNORMAL HIGH (ref 70–99)

## 2021-04-23 LAB — HEPATITIS B SURFACE ANTIGEN: Hepatitis B Surface Ag: NONREACTIVE

## 2021-04-23 LAB — PHOSPHORUS: Phosphorus: 6.4 mg/dL — ABNORMAL HIGH (ref 2.5–4.6)

## 2021-04-23 MED ORDER — LIDOCAINE 5 % EX PTCH
1.0000 | MEDICATED_PATCH | CUTANEOUS | Status: DC
  Administered 2021-04-23 – 2021-04-27 (×5): 1 via TRANSDERMAL
  Filled 2021-04-23 (×5): qty 1

## 2021-04-23 MED ORDER — HEPARIN SODIUM (PORCINE) 1000 UNIT/ML IJ SOLN
INTRAMUSCULAR | Status: AC
  Filled 2021-04-23: qty 5

## 2021-04-23 NOTE — Progress Notes (Addendum)
Occupational Therapy Treatment ?Patient Details ?Name: [name] ?MRN: [id number] ?DOB: 22-Jul-1945 ?Today's Date: 04/23/2021 ? ? ?History of present illness Pt is a 76 y.o. female admitted 04/03/21 after fall at home when leg gave out sustaining L displaced intertrochanteric hip fx. S/p L hip cephalomedullary nail and closed management of superior pubic ramus fx 2/23. On 3/3, pt with worsening respiratory status and encephalopathy, transferred to ICU and started on BiPAP. PMH includes afib, CAD, CHF, HTN, pacemaker, asthma, DM2, stroke, OSA, cervical sx, back sx, R ankle fx. ?  ?OT comments ? Pt presented in bed and noted to have increase in level of awareness as was able to recall this therapist and they were no longer in ICU setting. Pt was able to complete rolling with min assist side to side. Pt then completed supine to sitting with min to mod assist, eating while sitting at EOB post set up and laterally scooting to [facility] but easily fatigued. Pt then required set up to go to HD and required mod assist for sitting to supine for LLE. Pt currently with functional limitations due to the deficits listed below (see OT Problem List).  Pt will benefit from skilled OT to increase their safety and independence with ADL and functional mobility for ADL to facilitate discharge to venue listed below.  ?  ? ?Recommendations for follow up therapy are one component of a multi-disciplinary discharge planning process, led by the attending physician.  Recommendations may be updated based on patient status, additional functional criteria and insurance authorization. ?   ?Follow Up Recommendations ? Skilled nursing-short term rehab (<3 hours/day)  ?  ?Assistance Recommended at Discharge Frequent or constant Supervision/Assistance  ?Patient can return home with the following ? Two people to help with walking and/or transfers;Two people to help with bathing/dressing/bathroom;Assist for transportation;Help with stairs or ramp for  entrance;Assistance with cooking/housework ?  ?Equipment Recommendations ? None recommended by OT;Other (comment)  ?  ?Recommendations for Other Services   ? ?  ?Precautions / Restrictions Precautions ?Precautions: Fall;Other (comment) ?Precaution Comments: Wears 3L O2 baseline, on HHFNC ?Restrictions ?Weight Bearing Restrictions: Yes ?LLE Weight Bearing: Weight bearing as tolerated  ? ? ?  ? ?Mobility Bed Mobility ?Overal bed mobility: Needs Assistance ?Bed Mobility: Supine to Sit, Sit to Supine ?Rolling: Min assist ?  ?Supine to sit: Mod assist, HOB elevated ?Sit to supine: Mod assist ?  ?General bed mobility comments: Assist for LE advancement off EOB and placement back onto bed ?  ? ?Transfers ?  ?  ?  ?  ?  ?  ?  ?  ?  ?General transfer comment: Pt needed to go out for HD but worked on latteral scooting up to the [facility] with mod to max assistanc but noted fatigued quickly with activity. ?  ?  ?Balance Overall balance assessment: Needs assistance ?Sitting-balance support: Feet supported ?Sitting balance-Leahy Scale: Good ?Sitting balance - Comments: able to complete slef feeding post set up at EOB ?  ?  ?  ?  ?  ?  ?  ?  ?  ?  ?  ?  ?  ?  ?  ?   ? ?ADL either performed or assessed with clinical judgement  ? ?ADL Overall ADL's : Needs assistance/impaired ?Eating/Feeding: Set up;Cueing for sequencing;Sitting ?  ?Grooming: Set up;Cueing for safety;Cueing for sequencing;Sitting ?  ?Upper Body Bathing: Minimal assistance;Cueing for safety;Cueing for sequencing;Sitting ?  ?Lower Body Bathing: Total assistance;Cueing for safety;Cueing for sequencing;Sitting/lateral leans ?  ?Upper Body  Dressing : Cueing for safety;Cueing for sequencing;Sitting;Minimal assistance ?  ?Lower Body Dressing: Total assistance;Sitting/lateral leans ?  ?  ?  ?  ?  ?  ?  ?  ?  ?  ? ?Extremity/Trunk Assessment Upper Extremity Assessment ?Upper Extremity Assessment: Generalized weakness ?RUE Deficits / Details: Pt noted to have increase in control  of BUE but still presents with edema in BUE but now able to grasp onto utensils ?RUE Coordination: decreased fine motor ?LUE Coordination: decreased fine motor ?  ?Lower Extremity Assessment ?Lower Extremity Assessment: Defer to PT evaluation ?LLE Deficits / Details: s/p L hip IMN ?LLE: Unable to fully assess due to pain ?LLE Coordination: decreased gross motor ?  ?  ?  ? ?Vision   ?Vision Assessment?: No apparent visual deficits ?  ?Perception   ?  ?Praxis   ?  ? ?Cognition Arousal/Alertness: Awake/alert ?Behavior During Therapy: Flat affect ?Overall Cognitive Status: Impaired/Different from baseline ?Area of Impairment: Following commands, Safety/judgement, Problem solving ?  ?  ?  ?  ?  ?  ?  ?  ?  ?  ?  ?Following Commands: Follows one step commands with increased time ?Safety/Judgement: Decreased awareness of safety, Decreased awareness of deficits ?Awareness: Emergent ?Problem Solving: Slow processing, Decreased initiation, Difficulty sequencing, Requires verbal cues, Requires tactile cues ?General Comments: Pt noted to have increase in level of awareness as noted they were no longer in ICU ?  ?  ?   ?Exercises   ? ?  ?Shoulder Instructions   ? ? ?  ?General Comments    ? ? ?Pertinent Vitals/ Pain       Pain Assessment ?Pain Assessment: Faces ?Faces Pain Scale: Hurts even more ?Facial Expression: Relaxed, neutral ?Body Movements: Absence of movements ?Muscle Tension: Relaxed ?Compliance with ventilator (intubated pts.): N/A ?Vocalization (extubated pts.): N/A ?CPOT Total: 0 ?Pain Location: LLE ?Pain Descriptors / Indicators: Discomfort, Guarding, Grimacing ?Pain Intervention(s): Repositioned ? ?Home Living   ?  ?  ?  ?  ?  ?  ?  ?  ?  ?  ?  ?  ?  ?  ?  ?  ?  ?  ? ?  ?Prior Functioning/Environment    ?  ?  ?  ?   ? ?Frequency ? Min 2X/week  ? ? ? ? ?  ?Progress Toward Goals ? ?OT Goals(current goals can now be found in the care plan section) ? Progress towards OT goals: Progressing toward goals ? ?Acute Rehab  OT Goals ?Patient Stated Goal: to be able to get stronger ?OT Goal Formulation: With patient ?Time For Goal Achievement: 05/03/21 ?Potential to Achieve Goals: Fair ?ADL Goals ?Pt Will Perform Eating: with set-up ?Pt Will Perform Grooming: sitting;with set-up ?Pt Will Perform Upper Body Dressing: with min assist;sitting ?Pt Will Perform Lower Body Dressing: with adaptive equipment;with mod assist;sitting/lateral leans ?Pt Will Transfer to Toilet: with mod assist;with +2 assist;stand pivot transfer;bedside commode  ?Plan Discharge plan remains appropriate   ? ?Co-evaluation ? ? ?   ?  ?  ?  ?  ? ?  ?AM-PAC OT "6 Clicks" Daily Activity     ?Outcome Measure ? ? Help from another person eating meals?: A Little ?Help from another person taking care of personal grooming?: A Little ?Help from another person toileting, which includes using toliet, bedpan, or urinal?: Total ?Help from another person bathing (including washing, rinsing, drying)?: A Little ?Help from another person to put on and taking off regular upper body clothing?: A  Little ?Help from another person to put on and taking off regular lower body clothing?: Total ?6 Click Score: 14 ? ?  ?End of Session Equipment Utilized During Treatment: Gait belt ? ?OT Visit Diagnosis: Unsteadiness on feet (R26.81);Other abnormalities of gait and mobility (R26.89);Muscle weakness (generalized) (M62.81) ?  ?Activity Tolerance Patient limited by fatigue;Other (comment) (agreed as will have HD today as reported fatigued once they are back from tx but then limited as they needed to lleave for HD) ?  ?Patient Left in bed;with nursing/sitter in room (going with transport) ?  ?Nurse Communication Other (comment) (Pt requesting medicated creams) ?  ? ?   ? ?Time: 2585-2778 ?OT Time Calculation (min): 20 min ? ?Charges: OT General Charges ?$OT Visit: 1 Visit ?OT Treatments ?$Self Care/Home Management : 8-22 mins ? ?Joeseph Amor OTR/L  ?Acute Rehab Services  ?[phone] office  number ?[phone] pager number ? ? ?Joeseph Amor ?04/23/2021, 8:18 AM ?

## 2021-04-23 NOTE — TOC Progression Note (Signed)
Transition of Care (TOC) - Progression Note  ? ? ?Patient Details  ?Name: [name] ?MRN: [id number] ?Date of Birth: 10/22/1945 ? ?Transition of Care (TOC) CM/SW Contact  ?Joanne Chars, LCSW ?Phone Number: ?04/23/2021, 4:27 PM ? ?Clinical Narrative:    ?[facility] cannot accept HD pt at this time ?[facility] could possibly accept if HD at [facility] on MWF. ? ?[place] spoke with Melven Sartorius who will see if HD available on that schedule at [facility]. ? ?CSW spoke with pt son [name], updated him that [facility] is not option, discussed other current bed offers.  [name] is considering taking pt home, wants to discuss with his wife and brother.  He also said family could transport to Saturday HD if that would make a difference at [facility].   ? ? ? ?Expected Discharge Plan: [place] ?Barriers to Discharge: Continued Medical Work up, SNF Pending bed offer ? ?Expected Discharge Plan and Services ?Expected Discharge Plan: [place] ?In-house Referral: Clinical Social Work ?  ?Post Acute Care Choice: [place] ?Living arrangements for the past 2 months: [place] ?                ?  ?  ?  ?  ?  ?  ?  ?  ?  ?  ? ? ?Social Determinants of Health (SDOH) Interventions ?  ? ?Readmission Risk Interventions ?Readmission Risk Prevention Plan 01/10/2021  ?Transportation Screening Complete  ?PCP or Specialist Appt within 3-5 Days Complete  ?[place] or Home Care Consult Complete  ?Social Work Consult for [place] Planning/Counseling Complete  ?Palliative Care Screening Not Applicable  ?Medication Review Press photographer) Complete  ?Some recent data might be hidden  ? ? ?

## 2021-04-23 NOTE — Progress Notes (Signed)
?PROGRESS NOTE ? ? ? ?[name]  SRP:[id number] DOB: 06/05/1945 DOA: 04/03/2021 ?PCP: Lujean Amel, MD  ? ?Brief Narrative:  ?The patient is 76 year old obese female with known OSA OHS O2 dependent at 3 L nasal cannula who fell and suffered a fracture left femur.  She developed worsening renal failure while in the hospital required a HD catheter that was tunneled and placed on 04/11/2021 per interventional radiology.  On 04/12/2021 she had worsening respiratory failure she was obtunded and was transferred to intensive care unit for further evaluation and treatment by pulmonary critical care.   ? ?Significant Events:  ? ?04/12/2021 transferred intensive care unit; CRRT started ?3/4: improved mentation  ?3/5: CRRT stopped ?3/6: no UOP, increasing creatinine ?3/7: tolerated iHD ?3/8: d/c a-line. Transfer to progressive. ?3/9 TR[facility] assumed care and Dialysis to be attempted again and continues to have poor UOP ?3/10 she was able to be weaned to 8 L salter high flow nasal cannula with no respiratory distress; next dialysis session is tomorrow ?3/11 underwent dialysis today had had 1500 mL removed. Had to have Midodrine for BP support. PT/OT recommending SNF and Ortho recommending staple Removal Monday  ?3/12 patient continued to complain of some abdominal pain and has not had a bowel movement so we will continue bowel regimen and obtain a CT of the abdomen pelvis with oral contrast.  Plan is for dialysis on Tuesday 3/14 now. ?CT scan of the abdomen pelvis done and showed no acute abnormalities but did show a moderate stool burden.  He was given a bisacodyl suppository yesterday and another 1 today with improvement of her constipation.  She continues to complain of some back pain and there is no evidence of thoracic spine fracture but alignment was normal and she did have some diffuse intervertebral ankylosis seen throughout the thoracic spine and general osteopenia; she is getting close to being discharged to SNF as her  oxygen requirement is weaning down and patient's son has selected [facility]; will need clipping for dialysis ?Unfortunately on 04/23/2021 it is found that [facility] cannot actually accept the patient given that she needs to be dialyzed and then be clipped.  We will need to find an alternate SNF and she needs to be kept prior to safe discharge disposition.  Her abdominal pain is improved and resolved.  She is back on her home O2 requirement.  She continues to complain of back pain but is stable. ?  ? ? ?Assessment and Plan: ?* Closed left hip fracture ([place]) secondary to fall at home ?-Patient presents after having a fall found to have a left hip and left superior pubic ramus fracture.  Orthopedics consulted with plans to possibly take patient to the operating room in a.m. based off the geriatric perioperative cardiac risk assessment score patient has 16.1% probability of perioperative cardiac arrest or myocardial infarction. ?-Admitted to a surgical telemetry bed ?-Hip fracture order set utilized and now s/p ORIF ?-Hydrocodone/morphine IV as needed pain; Plan is for Staple Removal 04/22/21 ?-Transitions of care consulted as pateint will need SNF and patient's son has selected [facility] but unfortunately cannot go now as they cannot transport the patient for Dialysis so will need a New SNF and CLIP for Dialysis ? ? ? ?Back pain ?-Complaining of back Pain but has not been OOB much ?-DG Thoracic shows "There is no evidence of thoracic spine fracture. Alignment is normal. Diffuse intervertebral ankylosis is again seen throughout the thoracic spine. Generalized osteopenia noted. Cervical spine fusion hardware again demonstrated." ?-C/w  Pain Control and Try Lidocaine Patch ? ?Thrombocytopenia ([place]) ?-Platelet Count is now stable at 167 on the last check but dropped to 138 today ?-Continue to Monitor for S/Sx of Bleeding; no overt bleeding noted ?-Repeat CBC in the AM  ? ?Hypokalemia ?-Improved. K+ is now 4.5 and  5.4 ?-Continue to monitor and trend ?-Repeat CMP in the AM ? ?Fall at home, initial encounter ?-Resulted in Hip Fracture and she is s/p ORIF ? ?Chronic kidney disease, stage III B (moderate) (HCC) ?Hyperphosphatemia ?Hyperkalemia ?-On admission creatinine 1.69 which appears slightly above baseline which appears to be 1.3-1.5. ?Martin Majestic into Renal Failure and now had  ?Acute on chronic renal failure with anuria ?-Did not tolerate iHD due to hypotension so required CRRT 3/3-3/5.  ?-Patient's BUN/Cr is now gone from 64/4.26 -> 82/5.36 ?-Patient's K+ is now 5.4 and Phos Level is 6.4 ?-Tolerated iHD 3/7 with net UF 2L and also 3/9 as well as 3/11 where she had 1500 mL removed  ?Primary management per nephrology ?Last HD done 3/11 with HD 3/14 being done today  ?Continue Midodrine 10 mg po TID and will continue  ? ?Closed fracture of left superior pubic ramus (HCC) ?-Secondary to fall.  No surgical intervention recommended for treatment of this fracture. ? ?Respiratory failure ([place]) ?-Acute on Chronic Hypoxic/Hypercapneic Failure  ?-Patient has history of obesity hypoventilation syndrome and OSA.  She is chronically on 3 L of oxygen at baseline. ?-SpO2: 97 % ?O2 Flow Rate (L/min): 3 L/min ?FiO2 (%): (S) 35 %; O2 continues to Wean ?-Suspect this will continue to improve with volume removal from HD as she had 1500 mL removed the day before yesterday and will be undergoing dialysis tomorrow; back on her home O2 requirement now ? ?OSA/OHS ?Wean HHF for O2 saturations 88-92% ?BiPAP qHS ?Will need home BiPAP set up prior to discharge and will need either a NIV or BiPAP for discharge ? ?Diabetes mellitus without complication ([place]) ?Last hemoglobin A1c was 6.5 less than 3 months ago.  Patient is on Jardiance 10 mg daily. ?-Hypoglycemic protocols ?-Held Jardiance ?-CBGs before every meal with sensitive SSI ?-Adjust insulin regimen as needed ?-CBGs ranging from 89-116 now ? ?CHB (complete heart block) ([place]) ?S/p PPM/ICD  ? ?Chronic  diastolic CHF (congestive heart failure) ([place]) ?-Chronic.  Last echocardiogram noted EF of 55 to 60% with grade 1 diastolic dysfunction in 09/5460.  BNP improved to 333.8 from 712 on 2/6.  Patient has some mild lower extremity edema but does not appear grossly fluid overloaded.  She had been seen by her cardiologist Dr. Burt Knack on 2/20 and recommended to increase Torsemide to 80 mg daily but now stoped due to soft BP and because patient getting Dialysis.  ?-Strict I&Os and daily weights; She is -10.580 Liters ?-Continued current dose of Torsemide but no longer on it and getting Volume Mainteance with HD ? ?S/P CABG x 5 ?-Last cardiac catheterization was in 02/2019 where all territories were noted to have adequate coronary flow via bypasses or native vessels with stents. ?-Continue aspirin, Plavix, and Crestor ?-BB and Isororbide held due to Hypotension earlier but may resume slowly but continue to Monitor  ? ?Abdominal pain ?-Continues to have Abdominal Pain and states she hasn't had a bowel movement; Bowel regimen adjusted  ?-Obtained a CT Abd/Pelvis w/ Oral Contrast and showed "No acute findings in the abdomen or pelvis. Trace bilateral effusions, bibasilar atelectasis. Aortic atherosclerosis. Colonic diverticulosis." Did show a Moderate Stool burden ?-Abdominal Pain is improving and resolved and she  is having Bowel Movements  ? ?Mixed hypercholesterolemia and hypertriglyceridemia ?-Continue Crestor ? ?HTN (hypertension), benign ?-Home medication regimen includes bisoprolol 7.5 mg daily, Imdur 90 mg daily, torsemide 80 mg daily which were held due to her softer BP ?-GDMT for Chronic Diastolic Heart Failure held due to Hypotension and will resume as Able but BP still on the soft side  ?-Continue to Monitor BP per Protocol; Last BP reading was 107/53 ? ? ?ANEMIA ?-Hemoglobin 10.8 g/dL which appears around lower end of patient's baseline which ranges from 10- 12 g/dL. ?-Now Hgb/Hct went from 8.7/27.1 -> 9.5/30.1 ->  9.0/29.6 -> 8.6/27.5 -> 8.1/25.8 on last check and will repeat in the AM  ?Anemia Panel showed iron level of 39, U IBC of 290, TIBC of 329, saturation ratios of 12%, ferritin level 206 ?-Continue to monitor H&H and conti

## 2021-04-23 NOTE — Progress Notes (Signed)
?[facility] ?Progress Note  ? ?Subjective:  Seen on HD.  Tolerating fine - UFG 4.5L. I/Os 510 / 10. Wt 99.3 frp, 97.4 from 96.2.  Cr 2. 96 > 4.3 > 5.4.  ?Dysuria resolved  now.   ? ?Objective ?Vitals:  ? 04/23/21 0815 04/23/21 0824 04/23/21 0830 04/23/21 0900  ?BP: (!) 110/56 (!) 127/59 128/62 (!) 103/52  ?Pulse: 76 76 73 74  ?Resp: '16 18 16 17  '$ ?Temp: 98.6 ?F (37 ?C)     ?TempSrc: Oral     ?SpO2: 100%     ?Weight: 99.3 kg     ?Height:      ? ?Physical Exam ?General: obese woman lying in bed looking tired but nontoxic ?Heart: RRR ?Lungs: clear ?Abdomen: soft, obese ?Extremities: trace edema ?Dialysis Access:  [facility] c/d/I some bruising but no erythema ? ?Additional Objective ?Labs: ?Basic Metabolic Panel: ?Recent Labs  ?Lab 04/21/21 ?2683 04/22/21 ?4196 04/23/21 ?0246  ?NA 133* 130* 126*  ?K 4.0 4.5 5.4*  ?CL 96* 93* 89*  ?CO2 '25 26 26  '$ ?GLUCOSE 117* 129* 105*  ?BUN 38* 64* 82*  ?CREATININE 2.96* 4.26* 5.36*  ?CALCIUM 8.8* 8.8* 9.0  ?PHOS 5.4* 5.5* 6.4*  ? ? ?Liver Function Tests: ?Recent Labs  ?Lab 04/19/21 ?2229 04/19/21 ?2057 04/20/21 ?0322 04/21/21 ?7989 04/22/21 ?2119 04/23/21 ?0246  ?AST 26  --  25  --   --  13*  ?ALT 22  --  23  --   --  18  ?ALKPHOS 150*  --  142*  --   --  135*  ?BILITOT 1.0  --  0.8  --   --  0.4  ?PROT 6.8  --  6.7  --   --  6.2*  ?ALBUMIN 2.2*   < > 2.2* 2.1* 2.1* 2.3*  ? < > = values in this interval not displayed.  ? ? ?No results for input(s): LIPASE, AMYLASE in the last 168 hours. ?CBC: ?Recent Labs  ?Lab 04/18/21 ?1002 04/19/21 ?4174 04/19/21 ?2100 04/20/21 ?0814 04/23/21 ?0246  ?WBC 10.8* 11.2* 8.7 9.5 9.1  ?NEUTROABS 8.9* 9.4*  --  7.5 7.8*  ?HGB 8.7* 9.5* 9.0* 8.6* 8.1*  ?HCT 27.1* 30.1* 29.6* 27.5* 25.8*  ?MCV 93.1 93.2 95.2 94.8 96.6  ?PLT 177 178 174 167 138*  ? ? ?Blood Culture ?   ?Component Value Date/Time  ? SDES SPUTUM 05/02/2018 0459  ? [place] NONE 05/02/2018 0459  ? CULT MULTIPLE SPECIES PRESENT, SUGGEST RECOLLECTION (A) 10/23/2015 1527  ?  REPTSTATUS 05/02/2018 FINAL 05/02/2018 0459  ? ? ?Cardiac Enzymes: ?No results for input(s): CKTOTAL, CKMB, CKMBINDEX, TROPONINI in the last 168 hours. ?CBG: ?Recent Labs  ?Lab 04/22/21 ?0741 04/22/21 ?1206 04/22/21 ?1640 04/22/21 ?2011 04/23/21 ?0757  ?GLUCAP 112* 103* 143* 133* 101*  ? ? ?Iron Studies:  ?Recent Labs  ?  04/21/21 ?4818  ?IRON 39  ?TIBC 329  ?FERRITIN 206  ? ? ?'@lablastinr3'$ @ ?Studies/Results: ?CT ABDOMEN PELVIS WO CONTRAST ? ?Result Date: 04/21/2021 ?CLINICAL DATA:  Generalized abdominal pain EXAM: CT ABDOMEN AND PELVIS WITHOUT CONTRAST TECHNIQUE: Multidetector CT imaging of the abdomen and pelvis was performed following the standard protocol without IV contrast. RADIATION DOSE REDUCTION: This exam was performed according to the departmental dose-optimization program which includes automated exposure control, adjustment of the mA and/or kV according to patient size and/or use of iterative reconstruction technique. COMPARISON:  09/04/2020 FINDINGS: Lower chest: Bibasilar opacities, likely atelectasis. Trace bilateral effusions. Pacer wires in the right heart. Aortic atherosclerosis. Hepatobiliary:  No focal liver abnormality is seen. Status post cholecystectomy. No biliary dilatation. Pancreas: No focal abnormality or ductal dilatation. Spleen: No focal abnormality.  Normal size. Adrenals/Urinary Tract: Small exophytic lesion off the lower pole of the right kidney measures 1.5 cm, stable since prior study, likely hemorrhagic cyst. Small exophytic cyst off the midpole of the left kidney, unchanged. No hydronephrosis. No renal or ureteral stones. Adrenal glands and urinary bladder unremarkable. Stomach/Bowel: Colonic diverticulosis. Moderate stool burden throughout the colon. Stomach and small bowel decompressed. Vascular/Lymphatic: Aortic atherosclerosis. Reproductive: Uterus and adnexa unremarkable.  No mass. Other: No free fluid or free air. Musculoskeletal: No acute bony abnormality. Diffuse  degenerative changes throughout the lumbar spine. Left femoral intertrochanteric fracture status post internal fixation. IMPRESSION: No acute findings in the abdomen or pelvis. Trace bilateral effusions, bibasilar atelectasis. Aortic atherosclerosis. Colonic diverticulosis. Electronically Signed   By: Rolm Baptise M.D.   On: 04/21/2021 17:08  ? ?DG Thoracic Spine 2 View ? ?Result Date: 04/21/2021 ?CLINICAL DATA:  Fall.  Thoracic back pain. EXAM: THORACIC SPINE 2 VIEWS COMPARISON:  None. FINDINGS: There is no evidence of thoracic spine fracture. Alignment is normal. Diffuse intervertebral ankylosis is again seen throughout the thoracic spine. Generalized osteopenia noted. Cervical spine fusion hardware again demonstrated. IMPRESSION: No acute findings. Diffuse thoracic intervertebral ankylosis and osteopenia. Electronically Signed   By: Marlaine Hind M.D.   On: 04/21/2021 13:31  ? ?DG CHEST PORT 1 VIEW ? ?Result Date: 04/23/2021 ?CLINICAL DATA:  Shortness of breath EXAM: PORTABLE CHEST 1 VIEW COMPARISON:  Chest x-ray dated April 20, 2021 FINDINGS: Cardiac and mediastinal contours are unchanged post median sternotomy and CABG. Right chest wall AICD with unchanged lead position. Left chest wall dialysis catheter with tip in the right atrium. Lower lung predominant linear opacities which are similar to multiple prior exams and likely due to scarring or atelectasis. No large pleural effusion or pneumothorax. IMPRESSION: No new parenchymal opacity. Electronically Signed   By: Yetta Glassman M.D.   On: 04/23/2021 08:44   ?Medications: ? sodium chloride    ? ? allopurinol  100 mg Oral Daily  ? aspirin  81 mg Oral Daily  ? calcium acetate  667 mg Oral TID WC  ? Chlorhexidine Gluconate Cloth  6 each Topical Q0600  ? clopidogrel  75 mg Oral Daily  ? cycloSPORINE  1 drop Both Eyes BID  ? darbepoetin (ARANESP) injection - DIALYSIS  100 mcg Intravenous Q Tue-HD  ? heparin injection (subcutaneous)  5,000 Units Subcutaneous Q8H  ?  insulin aspart  0-6 Units Subcutaneous TID WC  ? mouth rinse  15 mL Mouth Rinse q12n4p  ? midodrine  10 mg Oral 3 times per day on Tue Thu Sat  ? mometasone-formoterol  2 puff Inhalation BID  ? pantoprazole sodium  40 mg Oral BID  ? polyethylene glycol  17 g Oral BID  ? polyvinyl alcohol  1 drop Both Eyes Q12H  ? predniSONE  10 mg Oral Q breakfast  ? rosuvastatin  5 mg Oral QPM  ? senna-docusate  1 tablet Oral BID  ? sodium chloride flush  10-40 mL Intracatheter Q12H  ? ? ?Assessment/Plan: ?**AKI on CKD3:  baseline Cr in the 1.4-1.8 range now with anuric severe AKI following THR.  Renal US 2/25 CKD w/o obstruction. Thought to be ATN in the setting of post op hypotension.  CRRT 3/2 then converted to HD and continues to require ongoing HD.  On a TTS schedule at this time.  Now on  midodrine, continue.  Daily labs eval for recovery.  CLIP AKI underway.  ? ?**Anemia:  Hb in 8-9s, sat 12% so IV iron and ESA ordered.   ? ?**hyponatremia:  mild, hypervolemic + impaired free water excretion with anuric AKI, stable with HD.  ? ?**Hyperkalemia: mild, will correct with HD.  Renal diet ordered.  ? ?**BMM:  ca and phos ok - on  binder.  Check PTH.  ? ?**S/p hip fracture - operative repair 2/22 ? ?**Dysuria: I/O cath for urine culture ordered 3/13.  Not completed yesterday but symptoms improved so no need to collect.  ? ?**Vaginitis:  fluconazole '150mg'$  x 1 dose 3/13. ? ?Jannifer Hick MD ?04/23/2021, 9:42 AM  ?[facility] ?Pager: [phone] ? ? ?

## 2021-04-24 DIAGNOSIS — S72002A Fracture of unspecified part of neck of left femur, initial encounter for closed fracture: Secondary | ICD-10-CM | POA: Diagnosis not present

## 2021-04-24 LAB — CBC WITH DIFFERENTIAL/PLATELET
Abs Immature Granulocytes: 0.1 10*3/uL — ABNORMAL HIGH (ref 0.00–0.07)
Basophils Absolute: 0 10*3/uL (ref 0.0–0.1)
Basophils Relative: 0 %
Eosinophils Absolute: 0 10*3/uL (ref 0.0–0.5)
Eosinophils Relative: 0 %
HCT: 27.8 % — ABNORMAL LOW (ref 36.0–46.0)
Hemoglobin: 8.5 g/dL — ABNORMAL LOW (ref 12.0–15.0)
Immature Granulocytes: 1 %
Lymphocytes Relative: 9 %
Lymphs Abs: 0.8 10*3/uL (ref 0.7–4.0)
MCH: 30.1 pg (ref 26.0–34.0)
MCHC: 30.6 g/dL (ref 30.0–36.0)
MCV: 98.6 fL (ref 80.0–100.0)
Monocytes Absolute: 0.6 10*3/uL (ref 0.1–1.0)
Monocytes Relative: 7 %
Neutro Abs: 6.9 10*3/uL (ref 1.7–7.7)
Neutrophils Relative %: 83 %
Platelets: 146 10*3/uL — ABNORMAL LOW (ref 150–400)
RBC: 2.82 MIL/uL — ABNORMAL LOW (ref 3.87–5.11)
RDW: 18 % — ABNORMAL HIGH (ref 11.5–15.5)
WBC: 8.4 10*3/uL (ref 4.0–10.5)
nRBC: 0 % (ref 0.0–0.2)

## 2021-04-24 LAB — COMPREHENSIVE METABOLIC PANEL
ALT: 16 U/L (ref 0–44)
AST: 18 U/L (ref 15–41)
Albumin: 2.3 g/dL — ABNORMAL LOW (ref 3.5–5.0)
Alkaline Phosphatase: 117 U/L (ref 38–126)
Anion gap: 14 (ref 5–15)
BUN: 36 mg/dL — ABNORMAL HIGH (ref 8–23)
CO2: 24 mmol/L (ref 22–32)
Calcium: 9.2 mg/dL (ref 8.9–10.3)
Chloride: 95 mmol/L — ABNORMAL LOW (ref 98–111)
Creatinine, Ser: 2.88 mg/dL — ABNORMAL HIGH (ref 0.44–1.00)
GFR, Estimated: 16 mL/min — ABNORMAL LOW (ref >60)
Glucose, Bld: 100 mg/dL — ABNORMAL HIGH (ref 70–99)
Potassium: 4.3 mmol/L (ref 3.5–5.1)
Sodium: 133 mmol/L — ABNORMAL LOW (ref 135–145)
Total Bilirubin: 0.8 mg/dL (ref 0.3–1.2)
Total Protein: 6.2 g/dL — ABNORMAL LOW (ref 6.5–8.1)

## 2021-04-24 LAB — PHOSPHORUS: Phosphorus: 4.1 mg/dL (ref 2.5–4.6)

## 2021-04-24 LAB — URINE CULTURE: Culture: >=100000 — AB

## 2021-04-24 LAB — GLUCOSE, CAPILLARY
Glucose-Capillary: 102 mg/dL — ABNORMAL HIGH (ref 70–99)
Glucose-Capillary: 148 mg/dL — ABNORMAL HIGH (ref 70–99)
Glucose-Capillary: 215 mg/dL — ABNORMAL HIGH (ref 70–99)
Glucose-Capillary: 167 mg/dL — ABNORMAL HIGH (ref 70–99)

## 2021-04-24 LAB — MAGNESIUM: Magnesium: 2.2 mg/dL (ref 1.7–2.4)

## 2021-04-24 LAB — HEPATITIS B SURFACE ANTIBODY, QUANTITATIVE: Hep B S AB Quant (Post): >1000 m[IU]/mL (ref >9.9)

## 2021-04-24 MED ORDER — HYDROCORTISONE 0.5 % EX CREA
TOPICAL_CREAM | Freq: Two times a day (BID) | CUTANEOUS | Status: DC
  Filled 2021-04-24 (×2): qty 28.35

## 2021-04-24 MED ORDER — SIMETHICONE 80 MG PO CHEW
80.0000 mg | CHEWABLE_TABLET | Freq: Four times a day (QID) | ORAL | Status: DC
  Administered 2021-04-24 – 2021-04-28 (×14): 80 mg via ORAL
  Filled 2021-04-24 (×14): qty 1

## 2021-04-24 MED ORDER — SENNOSIDES-DOCUSATE SODIUM 8.6-50 MG PO TABS
2.0000 | ORAL_TABLET | Freq: Two times a day (BID) | ORAL | Status: DC
  Administered 2021-04-24 – 2021-04-27 (×6): 2 via ORAL
  Filled 2021-04-24 (×6): qty 2

## 2021-04-24 NOTE — Progress Notes (Signed)
Physical Therapy Treatment ?Patient Details ?Name: [name] ?MRN: [id number] ?DOB: Jun 30, 1945 ?Today's Date: 04/24/2021 ? ? ?History of Present Illness Pt is a 76 y.o. female admitted 04/03/21 after fall at home when leg gave out sustaining L displaced intertrochanteric hip fx. S/p L hip cephalomedullary nail and closed management of superior pubic ramus fx 2/23. On 3/3, pt with worsening respiratory status and encephalopathy, transferred to ICU and started on BiPAP. Pt with acute on chronic renal failure with anuria; CRRT 3/3-3/5; iHD initiated 3/7. PMH includes afib, CAD, CHF, HTN, pacemaker, asthma, DM2, stroke, OSA, cervical sx, back sx, R ankle fx. ?  ?PT Comments  ? ? Pt slowly progressing with mobility. Pt declined use of stedy or transfer to recliner, therefore today's session focused on standing trials at EOB with RW. Pt quick to fatigue, limited by decreased activity tolerance generalized weakness, pain and poor balance. Per chart, family considering having pt return home. If pt to return home, will require assist +2 and lift equipment for OOB activity. Continue to recommend SNF-level therapies to maximize functional mobility and decrease caregiver burden prior to return home. ?   ?Recommendations for follow up therapy are one component of a multi-disciplinary discharge planning process, led by the attending physician.  Recommendations may be updated based on patient status, additional functional criteria and insurance authorization. ? ?Follow Up Recommendations ? Skilled nursing-short term rehab (<3 hours/day) ?  ?  ?Assistance Recommended at Discharge Frequent or constant Supervision/Assistance  ?Patient can return home with the following Two people to help with walking and/or transfers;Two people to help with bathing/dressing/bathroom;Assistance with cooking/housework;Assist for transportation;Help with stairs or ramp for entrance ?  ?Equipment Recommendations ? Rolling walker (2  wheels);BSC/3in1;Wheelchair (measurements PT);Wheelchair cushion (measurements PT);Hospital bed;Other (comment) (hoyer lift)  ?  ?Recommendations for Other Services   ? ? ?  ?Precautions / Restrictions Precautions ?Precautions: Fall;Other (comment) ?Precaution Comments: 3L O2 baseline ?Restrictions ?Weight Bearing Restrictions: Yes ?LLE Weight Bearing: Weight bearing as tolerated  ?  ? ?Mobility ? Bed Mobility ?Overal bed mobility: Needs Assistance ?Bed Mobility: Supine to Sit ?  ?  ?Supine to sit: Mod assist, HOB elevated ?  ?  ?General bed mobility comments: ModA for RLE management and HHA to elevate trunk; pt left seated EOB at end of session by request ?  ? ?Transfers ?Overall transfer level: Needs assistance ?Equipment used: Rolling walker (2 wheels) ?Transfers: Sit to/from Stand ?Sit to Stand: Max assist ?  ?  ?  ?  ?  ?General transfer comment: Performed 2x standing trials from EOB to RW, cues for sequencing and hand placement; pt requiring momentum and maxA to power into standing, able to fully offload buttocks from bed but unable to achieve fully upright posture; pt declined use of stedy, declined additional trials due to fatigue ?  ? ?Ambulation/Gait ?  ?  ?  ?  ?  ?  ?  ?  ? ? ?Stairs ?  ?  ?  ?  ?  ? ? ?Wheelchair Mobility ?  ? ?Modified Rankin (Stroke Patients Only) ?  ? ? ?  ?Balance Overall balance assessment: Needs assistance ?Sitting-balance support: Feet supported ?Sitting balance-Leahy Scale: Fair ?  ?  ?Standing balance support: Bilateral upper extremity supported, During functional activity ?Standing balance-Leahy Scale: Zero ?  ?  ?  ?  ?  ?  ?  ?  ?  ?  ?  ?  ?  ? ?  ?Cognition Arousal/Alertness: Awake/alert ?Behavior During Therapy: Flat  affect ?Overall Cognitive Status: No family/caregiver present to determine baseline cognitive functioning ?Area of Impairment: Attention, Memory, Following commands, Safety/judgement, Awareness ?  ?  ?  ?  ?  ?  ?  ?  ?  ?Current Attention Level:  Selective ?Memory: Decreased short-term memory ?Following Commands: Follows one step commands with increased time, Follows multi-step commands inconsistently ?Safety/Judgement: Decreased awareness of safety, Decreased awareness of deficits ?Awareness: Emergent ?Problem Solving: Slow processing, Decreased initiation, Difficulty sequencing, Requires verbal cues, Requires tactile cues ?General Comments: pt responds "I can't" to mobility tasks, requiring frequent encouragement to attempt ?  ?  ? ?  ?Exercises General Exercises - Lower Extremity ?Long Arc Quad: AAROM, Left, Seated ?Heel Slides: AAROM, Left, Seated ? ?  ?General Comments General comments (skin integrity, edema, etc.): Per chart, family considering having pt return home; asked pt about this who reponds, "I thought I was going to rehab" - if to d/c home, will need +2 assist and lift equipment for OOB activity ?  ?  ? ?Pertinent Vitals/Pain Pain Assessment ?Pain Assessment: Faces ?Faces Pain Scale: Hurts even more ?Pain Location: LLE, back ?Pain Descriptors / Indicators: Discomfort, Guarding, Grimacing ?Pain Intervention(s): Premedicated before session, Monitored during session, Repositioned  ? ? ?Home Living   ?  ?  ?  ?  ?  ?  ?  ?  ?  ?   ?  ?Prior Function    ?  ?  ?   ? ?PT Goals (current goals can now be found in the care plan section) Progress towards PT goals: Progressing toward goals (slowly) ? ?  ?Frequency ? ? ? Min 3X/week ? ? ? ?  ?PT Plan Current plan remains appropriate  ? ? ?Co-evaluation   ?  ?  ?  ?  ? ?  ?AM-PAC PT "6 Clicks" Mobility   ?Outcome Measure ? Help needed turning from your back to your side while in a flat bed without using bedrails?: A Little ?Help needed moving from lying on your back to sitting on the side of a flat bed without using bedrails?: A Lot ?Help needed moving to and from a bed to a chair (including a wheelchair)?: Total ?Help needed standing up from a chair using your arms (e.g., wheelchair or bedside chair)?:  Total ?Help needed to walk in hospital room?: Total ?Help needed climbing 3-5 steps with a railing? : Total ?6 Click Score: 9 ? ?  ?End of Session Equipment Utilized During Treatment: Oxygen;Gait belt ?Activity Tolerance: Patient limited by fatigue ?Patient left: in bed;with call bell/phone within reach;with bed alarm set ?Nurse Communication: Mobility status;Other (comment) (pt preference to sit EOB to visit family) ?PT Visit Diagnosis: Other abnormalities of gait and mobility (R26.89);Muscle weakness (generalized) (M62.81);Pain ?Pain - Right/Left: Left ?Pain - part of body: Leg ?  ? ? ?Time: 4742-5956 ?PT Time Calculation (min) (ACUTE ONLY): 21 min ? ?Charges:  $Therapeutic Activity: 8-22 mins          ?          ? ?Mabeline Caras, PT, DPT ?Acute Rehabilitation Services  ?Pager [phone] ?Office [phone] ? ?Derry Lory ?04/24/2021, 4:16 PM ? ?

## 2021-04-24 NOTE — Progress Notes (Signed)
RT note. ?Patient currently on home setting of 3 L [place] sat 98% with no labored breathing noted at this time. BIPAP QHS and not needed at this time. RT will continue to monitor.  ?

## 2021-04-24 NOTE — Progress Notes (Signed)
Pt has been accepted at [facility] on TTS schedule with 6:00 chair time. Pt was clipped to this clinic when pt was for snf placement or to go home with son. Per CSW, pt and family to discuss d/c plans this afternoon and CSW will update navigator. Will assist as needed.  ? ?Melven Sartorius ?Renal Navigator ?[phone] ?

## 2021-04-24 NOTE — TOC Progression Note (Addendum)
Transition of Care (TOC) - Progression Note  ? ? ?Patient Details  ?Name: [name] ?MRN: [id number] ?Date of Birth: 06-19-1945 ? ?Transition of Care (TOC) CM/SW Contact  ?Joanne Chars, LCSW ?Phone Number: ?04/24/2021, 1:54 PM ? ?Clinical Narrative:   CSW resent out SNF referral with HD included. ? ?1443: CSW spoke with son [name], discussed responses to SNF referral.  He is leaning towards taking pt home, either this his home at [facility] [address] [facility] [phone] or they would go to pt address on face sheet and stay there.  [name] will be at the hospital to discuss with pt around 330 today and will talk with CSW at that time. ? ?CSW spoke with renal navigator [facility].  Pt currently clipped to [facility] on [employer], TTS 6am chair time.  This is closest option for [employer].  Can look at [place] clinic if it ends up being [place].   ? ?1500: CSW spoke with son [name] in room.  He is now considering placement at [facility] as he does not think he and his brother can provide enough support at home while working full time.  CSW called Teena at [facility] and she will reach out to son [name] to answer questions.   ? ?1700: Message from [facility]: there are no MWF outpt HD seats available anywhere in [place] currently.   ? ?Expected Discharge Plan: [place] ?Barriers to Discharge: Continued Medical Work up, SNF Pending bed offer ? ?Expected Discharge Plan and Services ?Expected Discharge Plan: [place] ?In-house Referral: Clinical Social Work ?  ?Post Acute Care Choice: [place] ?Living arrangements for the past 2 months: [place] ?                ?  ?  ?  ?  ?  ?  ?  ?  ?  ?  ? ? ?Social Determinants of Health (SDOH) Interventions ?  ? ?Readmission Risk Interventions ?Readmission Risk Prevention Plan 01/10/2021  ?Transportation Screening Complete  ?PCP or Specialist Appt within 3-5 Days Complete  ?[place] or Home Care  Consult Complete  ?Social Work Consult for [place] Planning/Counseling Complete  ?Palliative Care Screening Not Applicable  ?Medication Review Press photographer) Complete  ?Some recent data might be hidden  ? ? ?

## 2021-04-24 NOTE — Progress Notes (Signed)
?PROGRESS NOTE ? ?[name] FGH:[id number] DOB: 1945/05/23 DOA: 04/03/2021 ?PCP: Lujean Amel, MD ? ? LOS: 21 days  ? ?Brief Narrative / Interim history: ?The patient is 76 year old obese female with known OSA OHS O2 dependent at 3 L nasal cannula who fell and suffered a fracture left femur, treated with ORIF.  She developed worsening renal failure while in the hospital required a HD catheter that was tunneled and placed on 04/11/2021 per interventional radiology.  On 04/12/2021 she had worsening respiratory failure she was obtunded and was transferred to intensive care unit for further evaluation and treatment by pulmonary critical care and started on CRRT.  He was transferred back to the hospitalist service and has remained stable, awaiting placement/outpatient HD ? ?Subjective / 24h Interval events: ?She is doing about the same.  Denies any shortness of breath, no abdominal pain, no nausea or vomiting. ? ?Assesement and Plan: ?Principal Problem: ?  Closed left hip fracture ([place]) secondary to fall at home ?Active Problems: ?  Back pain ?  Thrombocytopenia ([place]) ?  ANEMIA ?  HTN (hypertension), benign ?  Mixed hypercholesterolemia and hypertriglyceridemia ?  Abdominal pain ?  S/P CABG x 5 ?  Chronic diastolic CHF (congestive heart failure) ([place]) ?  CHB (complete heart block) (HCC) ?  Diabetes mellitus without complication ([place]) ?  Respiratory failure ([place]) ?  Closed fracture of left superior pubic ramus (HCC) ?  Chronic kidney disease, stage III B (moderate) (HCC) ?  Fall at home, initial encounter ?  Hypokalemia ?  Pressure injury of skin ? ? ?Assessment and Plan: ?Principal problem ?Closed left hip fracture ([place]) secondary to fall at home -Patient presents after having a fall found to have a left hip and left superior pubic ramus fracture.  Orthopedic surgery consulted, she is status post ORIF.  Needs SNF, placement pending ? ?Active problems ?Back pain -Complaining of back Pain but has not been OOB much.   Thoracic spine x-ray without acute findings but diffuse intervertebral ankylosis with generalized osteopenia.  Conservative management ? ?Thrombocytopenia (HCC) -mild, stable.  No bleeding ? ?Hypokalemia -monitor and replete as indicated ? ?Acute kidney injury on chronic kidney disease, stage III B, now ESRD-nephrology following, currently on dialysis ? ?Closed fracture of left superior pubic ramus (HCC) -Secondary to fall.  No surgical intervention recommended for treatment of this fracture. ? ?Acute on chronic hypoxic hypoxic hypercapnic respiratory failure -Patient has history of obesity hypoventilation syndrome and OSA.  She is chronically on 3 L of oxygen at baseline.  Currently at baseline ? ?Diabetes mellitus without complication (HCC) -Last hemoglobin A1c was 6.5 less than 3 months ago.  Patient is on Jardiance 10 mg daily. ? ?CBG (last 3)  ?Recent Labs  ?  04/23/21 ?1951 04/24/21 ?0808 04/24/21 ?1207  ?GLUCAP 150* 102* 148*  ? ?CHB (complete heart block) (HCC) -S/p PPM/ICD  ? ?Chronic diastolic CHF (congestive heart failure) (HCC) -Chronic.  Fluid management per dialysis ? ?S/P CABG x 5 -Last cardiac catheterization was in 02/2019 where all territories were noted to have adequate coronary flow via bypasses or native vessels with stents. Continue aspirin, Plavix, and Crestor ? ?Abdominal pain -CT scan without acute findings.  Continue bowel regimen ? ?Mixed hypercholesterolemia and hypertriglyceridemia -Continue Crestor ? ?HTN (hypertension), benign -treatment as below ? ?Anemia in the setting of renal disease-monitor CBC, no bleeding ? ?Scheduled Meds: ? allopurinol  100 mg Oral Daily  ? aspirin  81 mg Oral Daily  ? calcium acetate  667 mg  Oral TID WC  ? Chlorhexidine Gluconate Cloth  6 each Topical Q0600  ? clopidogrel  75 mg Oral Daily  ? cycloSPORINE  1 drop Both Eyes BID  ? darbepoetin (ARANESP) injection - DIALYSIS  100 mcg Intravenous Q Tue-HD  ? heparin injection (subcutaneous)  5,000 Units  Subcutaneous Q8H  ? hydrocortisone cream   Topical BID  ? insulin aspart  0-6 Units Subcutaneous TID WC  ? lidocaine  1 patch Transdermal Q24H  ? mouth rinse  15 mL Mouth Rinse q12n4p  ? midodrine  10 mg Oral 3 times per day on Tue Thu Sat  ? mometasone-formoterol  2 puff Inhalation BID  ? pantoprazole sodium  40 mg Oral BID  ? polyethylene glycol  17 g Oral BID  ? polyvinyl alcohol  1 drop Both Eyes Q12H  ? predniSONE  10 mg Oral Q breakfast  ? rosuvastatin  5 mg Oral QPM  ? senna-docusate  1 tablet Oral BID  ? sodium chloride flush  10-40 mL Intracatheter Q12H  ? ?Continuous Infusions: ? sodium chloride    ? ?PRN Meds:.acetaminophen, albuterol, alum & mag hydroxide-simeth, bisacodyl, guaiFENesin, hydrALAZINE, HYDROcodone-acetaminophen, melatonin, ondansetron (ZOFRAN) IV, simethicone, sodium chloride, sodium chloride flush ? ?Diet Orders (From admission, onward)  ? ?  Start     Ordered  ? 04/14/21 1116  Diet renal with fluid restriction Fluid restriction: 1200 mL Fluid; Room service appropriate? Yes; Fluid consistency: Thin  Diet effective now       ?Question Answer Comment  ?Fluid restriction: 1200 mL Fluid   ?Room service appropriate? Yes   ?Fluid consistency: Thin   ?  ? 04/14/21 1115  ? ?  ?  ? ?  ? ? ?DVT prophylaxis: heparin injection 5,000 Units Start: 04/15/21 1400 ? ? ?Lab Results  ?Component Value Date  ? PLT 146 (L) 04/24/2021  ? ? ?  Code Status: Full Code ? ?Family Communication: no family at bedside  ? ?Status is: Inpatient ? ?Remains inpatient appropriate because: waiting for placement ? ?Level of care: Progressive ? ?Consultants:  ?Nephrology  ? ?Procedures:  ?none ? ?Microbiology  ?none ? ?Antimicrobials: ?none  ? ? ?Objective: ?Vitals:  ? 04/23/21 2300 04/24/21 0725 04/24/21 0800 04/24/21 0809  ?BP: (!) 126/50  (!) 120/56 117/61  ?Pulse: 95  80 77  ?Resp: (!) '22  19 15  '$ ?Temp:    98 ?F (36.7 ?C)  ?TempSrc:    Oral  ?SpO2: 100% 100% 97% 98%  ?Weight:    98.5 kg  ?Height:      ? ? ?Intake/Output  Summary (Last 24 hours) at 04/24/2021 1129 ?Last data filed at 04/24/2021 0848 ?Gross per 24 hour  ?Intake 240 ml  ?Output 2000 ml  ?Net -1760 ml  ? ?Wt Readings from Last 3 Encounters:  ?04/24/21 98.5 kg  ?04/01/21 103 kg  ?03/18/21 103.6 kg  ? ? ?Examination: ? ?Constitutional: NAD ?Eyes: no scleral icterus ?ENMT: Mucous membranes are moist.  ?Neck: normal, supple ?Respiratory: clear to auscultation bilaterally, no wheezing, no crackles.  ?Cardiovascular: Regular rate and rhythm, no murmurs / rubs / gallops. ?Abdomen: non distended, no tenderness. Bowel sounds positive.  ?Musculoskeletal: no clubbing / cyanosis.  ?Skin: no rashes ?Neurologic: non focal  ? ? ?Data Reviewed: I have independently reviewed following labs and imaging studies  ? ?CBC ?Recent Labs  ?Lab 04/18/21 ?1002 04/19/21 ?9357 04/19/21 ?2100 04/20/21 ?0177 04/23/21 ?0246 04/24/21 ?0215  ?WBC 10.8* 11.2* 8.7 9.5 9.1 8.4  ?HGB 8.7* 9.5* 9.0* 8.6*  8.1* 8.5*  ?HCT 27.1* 30.1* 29.6* 27.5* 25.8* 27.8*  ?PLT 177 178 174 167 138* 146*  ?MCV 93.1 93.2 95.2 94.8 96.6 98.6  ?MCH 29.9 29.4 28.9 29.7 30.3 30.1  ?MCHC 32.1 31.6 30.4 31.3 31.4 30.6  ?RDW 16.1* 16.4* 16.9* 17.0* 17.6* 18.0*  ?LYMPHSABS 0.8 0.7  --  1.0 0.7 0.8  ?MONOABS 0.6 0.8  --  0.8 0.5 0.6  ?EOSABS 0.0 0.0  --  0.1 0.0 0.0  ?BASOSABS 0.0 0.0  --  0.0 0.0 0.0  ? ? ?Recent Labs  ?Lab 04/18/21 ?0331 04/19/21 ?0998 04/19/21 ?2057 04/20/21 ?0322 04/21/21 ?3382 04/22/21 ?5053 04/23/21 ?0246 04/24/21 ?0215  ?NA  --  133*   < > 133* 133* 130* 126* 133*  ?K  --  3.7   < > 4.1 4.0 4.5 5.4* 4.3  ?CL  --  96*   < > 94* 96* 93* 89* 95*  ?CO2  --  21*   < > '26 25 26 26 24  '$ ?GLUCOSE  --  122*   < > 95 117* 129* 105* 100*  ?BUN  --  43*   < > 67* 38* 64* 82* 36*  ?CREATININE  --  2.86*   < > 4.14* 2.96* 4.26* 5.36* 2.88*  ?CALCIUM  --  8.6*   < > 8.8* 8.8* 8.8* 9.0 9.2  ?AST 22 26  --  25  --   --  13* 18  ?ALT 23 22  --  23  --   --  18 16  ?ALKPHOS 136* 150*  --  142*  --   --  135* 117  ?BILITOT 0.7 1.0  --   0.8  --   --  0.4 0.8  ?ALBUMIN 2.0* 2.2*   < > 2.2* 2.1* 2.1* 2.3* 2.3*  ?MG  --  2.1  --  2.3 2.2 2.2 2.5* 2.2  ? < > = values in this interval not displayed.  ? ? ?-----------------------------------------------

## 2021-04-24 NOTE — NC FL2 (Signed)
?[place] MEDICAID FL2 LEVEL OF CARE SCREENING TOOL  ?  ? ?IDENTIFICATION  ?Patient Name: ?[name] Birthdate: [birth year] Sex: female Admission Date (Current Location): ?04/03/2021  ?[place] and [place] Number: ? [place] ?[id number] N Facility and Address:  ?[facility], [place] [address] [place], [place] [address] ?     Provider Number: ?[id number]  ?Attending Physician Name and Address:  ?Caren Griffins, MD ? Relative Name and Phone Number:  ?Marti, Mclane [phone] ?   ?Current Level of Care: ?Hospital Recommended Level of Care: ?[place] Prior Approval Number: ?  ? ?Date Approved/Denied: ?  PASRR Number: ?[id number] A ? ?Discharge Plan: ?SNF ?  ? ?Current Diagnoses: ?Patient Active Problem List  ? Diagnosis Date Noted  ? Pressure injury of skin 04/13/2021  ? Closed left hip fracture ([place]) secondary to fall at home 04/03/2021  ? Closed fracture of left superior pubic ramus ([place]) 04/03/2021  ? Chronic kidney disease, stage III B (moderate) (HCC) 04/03/2021  ? Fall at home, initial encounter 04/03/2021  ? Hypokalemia 04/03/2021  ? Obesity hypoventilation syndrome ([place]) 04/02/2021  ? Primary osteoarthritis, left ankle and foot 02/05/2021  ? Allergy to shellfish 01/15/2021  ? Decreased estrogen level 01/15/2021  ? Diabetic renal disease ([place]) 01/15/2021  ? Gallstones 01/15/2021  ? Idiopathic gout 01/15/2021  ? Mild intermittent asthma 01/15/2021  ? Pure hypercholesterolemia 01/15/2021  ? CHF (congestive heart failure) ([place]) 01/07/2021  ? Chronic diastolic heart failure ([place]) 12/31/2020  ? Nasal congestion with rhinorrhea 10/24/2020  ? Carotid stenosis 10/01/2020  ? Ischemic cardiomyopathy 09/27/2020  ? Hypertension 09/19/2020  ? Acute left-sided weakness 09/16/2020  ? Blood blister 08/01/2020  ? Elevated troponin 06/01/2019  ? Constipation 10/21/2018  ? Chronic pain 07/30/2018  ? Congestive heart failure (CHF) ([place]) 07/30/2018  ? Supplemental oxygen dependent  07/30/2018  ? Leukopenia 05/03/2018  ? Thrombocytopenia ([place]) 05/03/2018  ? Pneumonia due to human metapneumovirus 05/02/2018  ? Chest pain of uncertain etiology 84/13/2440  ? Allergic rhinitis 04/13/2018  ? S/P laparoscopic cholecystectomy 04/09/2018  ? Respiratory failure ([place]) 03/07/2018  ? Polyp of cecum   ? Polyp of transverse colon   ? Positive colorectal cancer screening using Cologuard test   ? Dysphagia   ? Acute on chronic renal failure ([place]) 11/20/2017  ? Coronary artery disease of native artery of native heart with stable angina pectoris ([place]) 11/20/2017  ? Diabetes mellitus without complication ([place]) 12/07/2534  ? Abdominal pain, epigastric 11/20/2017  ? Vasomotor rhinitis 10/16/2016  ? Right facial numbness 03/05/2016  ? Chronic renal failure syndrome 03/05/2016  ? Atrial fibrillation ([place]) 10/24/2015  ? Chest pain with moderate risk of acute coronary syndrome 10/23/2015  ? History of TIA (transient ischemic attack) 10/22/2015  ? CHB (complete heart block) ([place]) 06/22/2015  ? Allergic drug rash due to anti-infective agent 04/29/2015  ? Fatigue 02/14/2015  ? Chronic diastolic CHF (congestive heart failure) ([place]) 02/14/2015  ? Cellulitis 12/21/2014  ? S/P CABG x 5 11/29/2014  ? CAD S/P percutaneous coronary angioplasty   ? Diarrhea 07/12/2014  ? Abdominal pain 07/12/2014  ? Acute on chronic diastolic heart failure ([place]) 11/04/2013  ? Mixed hypercholesterolemia and hypertriglyceridemia 04/22/2013  ? Vertigo 04/22/2013  ? Dyspnea on exertion 08/25/2012  ? Allergic to IV contrast 07/23/2012  ? Unstable angina ([place]) 07/22/2012  ? Obesity 11/22/2011  ? Cardiomyopathy ([place]) 11/22/2011  ? Gastroesophageal reflux disease 11/22/2011  ? Back pain 11/22/2011  ? OSA (obstructive sleep apnea) 11/22/2011  ? HEMORRHOIDS-EXTERNAL 02/21/2010  ?  NAUSEA 02/21/2010  ? ABDOMINAL PAIN -GENERALIZED 02/21/2010  ? PERSONAL HX COLONIC POLYPS 02/21/2010  ? ANEMIA 04/16/2007  ? HTN (hypertension), benign 04/16/2007  ?  DIVERTICULOSIS, COLON 04/16/2007  ? ARTHRITIS 04/16/2007  ? INTERNAL HEMORRHOIDS WITHOUT MENTION COMP 04/12/2007  ? Sleep apnea 04/12/2007  ? Cardiac resynchronization therapy pacemaker (CRT-P) in place 04/12/2007  ? Gastritis without bleeding 12/14/2006  ? COLONIC POLYPS, HYPERPLASTIC 09/27/2002  ? FATTY LIVER DISEASE 02/16/2002  ? ? ?Orientation RESPIRATION BLADDER Height & Weight   ?  ?Self, Time, Situation, Place ? O2 Incontinent, Indwelling catheter Weight: 217 lb 3.2 oz (98.5 kg) ?Height:  '5\' 3"'$  (160 cm)  ?BEHAVIORAL SYMPTOMS/MOOD NEUROLOGICAL BOWEL NUTRITION STATUS  ?    Incontinent Diet (see discharge summary)  ?AMBULATORY STATUS COMMUNICATION OF NEEDS Skin   ?Total Care Verbally Surgical wounds ?  ?  ?  ?    ?     ?     ? ? ?Personal Care Assistance Level of Assistance  ?Total care Bathing Assistance: Maximum assistance ?Feeding assistance: Limited assistance ?Dressing Assistance: Maximum assistance ?Total Care Assistance: Maximum assistance  ? ?Functional Limitations Info  ?Sight, Hearing, Speech Sight Info: Adequate ?Hearing Info: Adequate ?Speech Info: Adequate  ? ? ?SPECIAL CARE FACTORS FREQUENCY  ?PT (By licensed PT), OT (By licensed OT)   ?  ?PT Frequency: 5x week ?OT Frequency: 5x week ?  ?  ?  ?   ? ? ?Contractures Contractures Info: Not present  ? ? ?Additional Factors Info  ?Code Status, Allergies, Insulin Sliding Scale Code Status Info: full ?Allergies Info: Ivp Dye (Iodinated Contrast Media), Shellfish Allergy, Sulfa Antibiotics, Iodine, Atorvastatin, Benadryl (Diphenhydramine), Colchicine, Contrast Media (Iodinated Contrast Media), Doxycycline, Uloric (Febuxostat), Cephalexin, Zithromax (Azithromycin) ?  ?Insulin Sliding Scale Info: Novolog, 0-9 units TID with meals.  See discharge summary. ?  ?   ? ?Current Medications (04/24/2021):  This is the current hospital active medication list ?Current Facility-Administered Medications  ?Medication Dose Route Frequency Provider Last Rate Last Admin  ?  0.9 %  sodium chloride infusion  250 mL Intravenous Continuous Candee Furbish, MD      ? acetaminophen (TYLENOL) tablet 650 mg  650 mg Oral Q6H PRN Vanetta Mulders, MD   650 mg at 04/23/21 7035  ? albuterol (PROVENTIL) (2.5 MG/3ML) 0.083% nebulizer solution 2.5 mg  2.5 mg Nebulization Q4H PRN Darrick Meigs, [place]e, MD   2.5 mg at 04/18/21 2042  ? allopurinol (ZYLOPRIM) tablet 100 mg  100 mg Oral Daily Vanetta Mulders, MD   100 mg at 04/23/21 1257  ? alum & mag hydroxide-simeth (MAALOX/MYLANTA) 200-200-20 MG/5ML suspension 15 mL  15 mL Oral Q6H PRN [name] Latif, DO   15 mL at 04/19/21 1510  ? aspirin chewable tablet 81 mg  81 mg Oral Daily Jacky Kindle, MD   81 mg at 04/23/21 1257  ? bisacodyl (DULCOLAX) suppository 10 mg  10 mg Rectal Daily PRN Terrilee Croak, MD   10 mg at 04/10/21 1542  ? calcium acetate (PHOSLO) capsule 667 mg  667 mg Oral TID WC Justin Mend, MD   667 mg at 04/24/21 0817  ? Chlorhexidine Gluconate Cloth 2 % PADS 6 each  6 each Topical Q0600 Edrick Oh, MD   6 each at 04/23/21 640 105 0956  ? clopidogrel (PLAVIX) tablet 75 mg  75 mg Oral Daily Vanetta Mulders, MD   75 mg at 04/23/21 1257  ? cycloSPORINE (RESTASIS) 0.05 % ophthalmic emulsion 1 drop  1 drop Both Eyes BID Bokshan,  Remo Lipps, MD   1 drop at 04/23/21 2121  ? Darbepoetin Alfa (ARANESP) injection 100 mcg  100 mcg Intravenous Q Tue-HD Skeet Simmer, RPH   100 mcg at 04/23/21 1023  ? guaiFENesin (ROBITUSSIN) 100 MG/5ML liquid 15 mL  15 mL Oral Q6H PRN [name] [place], DO   15 mL at 04/19/21 5993  ? heparin injection 5,000 Units  5,000 Units Subcutaneous Q8H Jacky Kindle, MD   5,000 Units at 04/24/21 0535  ? hydrALAZINE (APRESOLINE) injection 5 mg  5 mg Intravenous Q4H PRN Elmer Sow, MD   5 mg at 04/14/21 0454  ? HYDROcodone-acetaminophen (NORCO/VICODIN) 5-325 MG per tablet 1-2 tablet  1-2 tablet Oral Q6H PRN Vanetta Mulders, MD   1 tablet at 04/24/21 5701  ? insulin aspart (novoLOG) injection 0-6 Units  0-6 Units Subcutaneous  TID WC Mitzi Hansen, MD   1 Units at 04/21/21 1750  ? lidocaine (LIDODERM) 5 % 1 patch  1 patch Transdermal Q24H [name] [place], DO   1 patch at 04/23/21 1724  ? MEDLINE mouth rinse  15 mL Mouth Rinse q12n4p Sm

## 2021-04-24 NOTE — Plan of Care (Signed)
?  Problem: Education: ?Goal: Knowledge of General Education information will improve ?Description: Including pain rating scale, medication(s)/side effects and non-pharmacologic comfort measures ?Outcome: Progressing ?  ?Problem: Health Behavior/Discharge Planning: ?Goal: Ability to manage health-related needs will improve ?Outcome: Progressing ?  ?Problem: Clinical Measurements: ?Goal: Ability to maintain clinical measurements within normal limits will improve ?Outcome: Progressing ?Goal: Will remain free from infection ?Outcome: Progressing ?Goal: Diagnostic test results will improve ?Outcome: Progressing ?Goal: Respiratory complications will improve ?Outcome: Progressing ?Goal: Cardiovascular complication will be avoided ?Outcome: Progressing ?  ?Problem: Activity: ?Goal: Risk for activity intolerance will decrease ?Outcome: Progressing ?  ?Problem: Coping: ?Goal: Level of anxiety will decrease ?Outcome: Progressing ?  ?Problem: Elimination: ?Goal: Will not experience complications related to bowel motility ?Outcome: Progressing ?Goal: Will not experience complications related to urinary retention ?Outcome: Progressing ?  ?Problem: Pain Managment: ?Goal: General experience of comfort will improve ?Outcome: Progressing ?  ?Problem: Safety: ?Goal: Ability to remain free from injury will improve ?Outcome: Progressing ?  ?Problem: Skin Integrity: ?Goal: Risk for impaired skin integrity will decrease ?Outcome: Progressing ?  ?Problem: Education: ?Goal: Knowledge of disease and its progression will improve ?Outcome: Progressing ?Goal: Individualized Educational Video(s) ?Outcome: Progressing ?  ?Problem: Fluid Volume: ?Goal: Compliance with measures to maintain balanced fluid volume will improve ?Outcome: Progressing ?  ?Problem: Health Behavior/Discharge Planning: ?Goal: Ability to manage health-related needs will improve ?Outcome: Progressing ?  ?Problem: Nutritional: ?Goal: Ability to make healthy dietary choices  will improve ?Outcome: Progressing ?  ?Problem: Clinical Measurements: ?Goal: Complications related to the disease process, condition or treatment will be avoided or minimized ?Outcome: Progressing ?  ?Problem: Education: ?Goal: Knowledge of General Education information will improve ?Description: Including pain rating scale, medication(s)/side effects and non-pharmacologic comfort measures ?Outcome: Progressing ?  ?Problem: Health Behavior/Discharge Planning: ?Goal: Ability to manage health-related needs will improve ?Outcome: Progressing ?  ?Problem: Clinical Measurements: ?Goal: Ability to maintain clinical measurements within normal limits will improve ?Outcome: Progressing ?Goal: Will remain free from infection ?Outcome: Progressing ?Goal: Diagnostic test results will improve ?Outcome: Progressing ?Goal: Respiratory complications will improve ?Outcome: Progressing ?Goal: Cardiovascular complication will be avoided ?Outcome: Progressing ?  ?Problem: Activity: ?Goal: Risk for activity intolerance will decrease ?Outcome: Progressing ?  ?Problem: Nutrition: ?Goal: Adequate nutrition will be maintained ?Outcome: Progressing ?  ?Problem: Coping: ?Goal: Level of anxiety will decrease ?Outcome: Progressing ?  ?Problem: Elimination: ?Goal: Will not experience complications related to bowel motility ?Outcome: Progressing ?Goal: Will not experience complications related to urinary retention ?Outcome: Progressing ?  ?Problem: Pain Managment: ?Goal: General experience of comfort will improve ?Outcome: Progressing ?  ?Problem: Safety: ?Goal: Ability to remain free from injury will improve ?Outcome: Progressing ?  ?Problem: Skin Integrity: ?Goal: Risk for impaired skin integrity will decrease ?Outcome: Progressing ?  ?

## 2021-04-24 NOTE — Progress Notes (Signed)
?[facility] ?Progress Note  ? ?Subjective:  Seen in room.  No new issues.  2L UF with HD yesterday ? ? ?Objective ?Vitals:  ? 04/23/21 2300 04/24/21 0725 04/24/21 0800 04/24/21 0809  ?BP: (!) 126/50  (!) 120/56 117/61  ?Pulse: 95  80 77  ?Resp: (!) '22  19 15  '$ ?Temp:    98 ?F (36.7 ?C)  ?TempSrc:    Oral  ?SpO2: 100% 100% 97% 98%  ?Weight:    98.5 kg  ?Height:      ? ?Physical Exam ?General: obese woman lying in bed looking tired but nontoxic ?Heart: RRR ?Lungs: clear ?Abdomen: soft, obese ?Extremities: 2+ tibial edema - worse ?Dialysis Access:  [facility] c/d/I some bruising but no erythema ? ?Additional Objective ?Labs: ?Basic Metabolic Panel: ?Recent Labs  ?Lab 04/22/21 ?0433 04/23/21 ?0246 04/24/21 ?0215  ?NA 130* 126* 133*  ?K 4.5 5.4* 4.3  ?CL 93* 89* 95*  ?CO2 '26 26 24  '$ ?GLUCOSE 129* 105* 100*  ?BUN 64* 82* 36*  ?CREATININE 4.26* 5.36* 2.88*  ?CALCIUM 8.8* 9.0 9.2  ?PHOS 5.5* 6.4* 4.1  ? ? ?Liver Function Tests: ?Recent Labs  ?Lab 04/20/21 ?0322 04/21/21 ?2376 04/22/21 ?2831 04/23/21 ?0246 04/24/21 ?0215  ?AST 25  --   --  13* 18  ?ALT 23  --   --  18 16  ?ALKPHOS 142*  --   --  135* 117  ?BILITOT 0.8  --   --  0.4 0.8  ?PROT 6.7  --   --  6.2* 6.2*  ?ALBUMIN 2.2*   < > 2.1* 2.3* 2.3*  ? < > = values in this interval not displayed.  ? ? ?No results for input(s): LIPASE, AMYLASE in the last 168 hours. ?CBC: ?Recent Labs  ?Lab 04/19/21 ?5176 04/19/21 ?2100 04/20/21 ?1607 04/23/21 ?0246 04/24/21 ?0215  ?WBC 11.2* 8.7 9.5 9.1 8.4  ?NEUTROABS 9.4*  --  7.5 7.8* 6.9  ?HGB 9.5* 9.0* 8.6* 8.1* 8.5*  ?HCT 30.1* 29.6* 27.5* 25.8* 27.8*  ?MCV 93.2 95.2 94.8 96.6 98.6  ?PLT 178 174 167 138* 146*  ? ? ?Blood Culture ?   ?Component Value Date/Time  ? SDES IN/OUT CATH URINE 04/22/2021 1522  ? SPECREQUEST  04/22/2021 1522  ?  NONE ?Performed at [facility], [place] [address], [place], [place] [address] ?  ? CULT >=100,000 COLONIES/mL PROTEUS MIRABILIS (A) 04/22/2021 1522  ? REPTSTATUS 04/24/2021 FINAL  04/22/2021 1522  ? ? ?Cardiac Enzymes: ?No results for input(s): CKTOTAL, CKMB, CKMBINDEX, TROPONINI in the last 168 hours. ?CBG: ?Recent Labs  ?Lab 04/23/21 ?2694 04/23/21 ?1250 04/23/21 ?1626 04/23/21 ?1951 04/24/21 ?8546  ?GLUCAP 101* 89 116* 150* 102*  ? ? ?Iron Studies:  ?No results for input(s): IRON, TIBC, TRANSFERRIN, FERRITIN in the last 72 hours. ? ?'@lablastinr3'$ @ ?Studies/Results: ?DG CHEST PORT 1 VIEW ? ?Result Date: 04/23/2021 ?CLINICAL DATA:  Shortness of breath EXAM: PORTABLE CHEST 1 VIEW COMPARISON:  Chest x-ray dated April 20, 2021 FINDINGS: Cardiac and mediastinal contours are unchanged post median sternotomy and CABG. Right chest wall AICD with unchanged lead position. Left chest wall dialysis catheter with tip in the right atrium. Lower lung predominant linear opacities which are similar to multiple prior exams and likely due to scarring or atelectasis. No large pleural effusion or pneumothorax. IMPRESSION: No new parenchymal opacity. Electronically Signed   By: Yetta Glassman M.D.   On: 04/23/2021 08:44   ?Medications: ? sodium chloride    ? ? allopurinol  100  mg Oral Daily  ? aspirin  81 mg Oral Daily  ? calcium acetate  667 mg Oral TID WC  ? Chlorhexidine Gluconate Cloth  6 each Topical Q0600  ? clopidogrel  75 mg Oral Daily  ? cycloSPORINE  1 drop Both Eyes BID  ? darbepoetin (ARANESP) injection - DIALYSIS  100 mcg Intravenous Q Tue-HD  ? heparin injection (subcutaneous)  5,000 Units Subcutaneous Q8H  ? hydrocortisone cream   Topical BID  ? insulin aspart  0-6 Units Subcutaneous TID WC  ? lidocaine  1 patch Transdermal Q24H  ? mouth rinse  15 mL Mouth Rinse q12n4p  ? midodrine  10 mg Oral 3 times per day on Tue Thu Sat  ? mometasone-formoterol  2 puff Inhalation BID  ? pantoprazole sodium  40 mg Oral BID  ? polyethylene glycol  17 g Oral BID  ? polyvinyl alcohol  1 drop Both Eyes Q12H  ? predniSONE  10 mg Oral Q breakfast  ? rosuvastatin  5 mg Oral QPM  ? senna-docusate  1 tablet Oral BID  ?  sodium chloride flush  10-40 mL Intracatheter Q12H  ? ? ?Assessment/Plan: ?**AKI on CKD3:  baseline Cr in the 1.4-1.8 range now with anuric severe AKI following THR.  Renal US 2/25 CKD w/o obstruction. Thought to be ATN in the setting of post op hypotension.  CRRT 3/2 then converted to HD and continues to require ongoing HD.  On a TTS schedule at this time.  Now on midodrine, continue.  Daily labs eval for recovery; says UOP increasing but incontinent, will request purewick again.  CLIP AKI underway.  ? ?**Anemia:  Hb in 8-9s, sat 12% so IV iron and ESA ordered.   ? ?**hyponatremia:  mild, hypervolemic + impaired free water excretion with oliguric AKI, stable with HD.  ? ?**Hyperkalemia: mild, will correct with HD.  Renal diet ordered.  ? ?**BMM:  ca and phos ok - on  binder.  Check PTH.  ? ?**S/p hip fracture - operative repair 2/22 ? ?Jannifer Hick MD ?04/24/2021, 11:52 AM  ?[facility] ?Pager: [phone] ? ? ?

## 2021-04-25 DIAGNOSIS — S72002A Fracture of unspecified part of neck of left femur, initial encounter for closed fracture: Secondary | ICD-10-CM | POA: Diagnosis not present

## 2021-04-25 LAB — RENAL FUNCTION PANEL
Albumin: 2.4 g/dL — ABNORMAL LOW (ref 3.5–5.0)
Anion gap: 12 (ref 5–15)
BUN: 53 mg/dL — ABNORMAL HIGH (ref 8–23)
CO2: 24 mmol/L (ref 22–32)
Calcium: 9.3 mg/dL (ref 8.9–10.3)
Chloride: 96 mmol/L — ABNORMAL LOW (ref 98–111)
Creatinine, Ser: 4.27 mg/dL — ABNORMAL HIGH (ref 0.44–1.00)
GFR, Estimated: 10 mL/min — ABNORMAL LOW (ref >60)
Glucose, Bld: 106 mg/dL — ABNORMAL HIGH (ref 70–99)
Phosphorus: 4 mg/dL (ref 2.5–4.6)
Potassium: 4.3 mmol/L (ref 3.5–5.1)
Sodium: 132 mmol/L — ABNORMAL LOW (ref 135–145)

## 2021-04-25 LAB — GLUCOSE, CAPILLARY
Glucose-Capillary: 137 mg/dL — ABNORMAL HIGH (ref 70–99)
Glucose-Capillary: 86 mg/dL (ref 70–99)
Glucose-Capillary: 112 mg/dL — ABNORMAL HIGH (ref 70–99)
Glucose-Capillary: 134 mg/dL — ABNORMAL HIGH (ref 70–99)

## 2021-04-25 LAB — MAGNESIUM: Magnesium: 2.2 mg/dL (ref 1.7–2.4)

## 2021-04-25 MED ORDER — HYDROCORTISONE 1 % EX CREA
TOPICAL_CREAM | Freq: Two times a day (BID) | CUTANEOUS | Status: DC
  Filled 2021-04-25: qty 28

## 2021-04-25 MED ORDER — LINACLOTIDE 145 MCG PO CAPS
145.0000 ug | ORAL_CAPSULE | Freq: Every day | ORAL | Status: DC
  Filled 2021-04-25: qty 1

## 2021-04-25 MED ORDER — HEPARIN SODIUM (PORCINE) 1000 UNIT/ML IJ SOLN
INTRAMUSCULAR | Status: AC
  Filled 2021-04-25: qty 4

## 2021-04-25 MED ORDER — PANTOPRAZOLE SODIUM 40 MG PO TBEC
40.0000 mg | DELAYED_RELEASE_TABLET | Freq: Two times a day (BID) | ORAL | Status: DC
  Administered 2021-04-25 – 2021-04-28 (×6): 40 mg via ORAL
  Filled 2021-04-25 (×6): qty 1

## 2021-04-25 NOTE — Progress Notes (Signed)
OT Cancellation Note ? ?Patient Details ?Name: MAKENNAH OMURA ?MRN: [id number] ?DOB: 08-25-1945 ? ? ?Cancelled Treatment:    Reason Eval/Treat Not Completed: Patient at procedure or test/ unavailable (Goinging out to HD) ?Joeseph Amor OTR/L  ?Acute Rehab Services  ?[phone] office number ?[phone] pager number ? ?Joeseph Amor ?04/25/2021, 8:14 AM ?

## 2021-04-25 NOTE — Progress Notes (Signed)
?[facility] ?Progress Note  ? ?Subjective:  Seen on HD.  No new issues.  Set for 4L UF - early tx, BP on low side and edema not bad - will dec to 3.5L total UFG.  Purewick was used in room yesterday, says UOP but no UOP doc in I/Os.   ? ? ?Objective ?Vitals:  ? 04/24/21 2348 04/25/21 0200 04/25/21 0717 04/25/21 0830  ?BP: 128/70 (!) 109/48  (!) 111/50  ?Pulse: 65 92    ?Resp: (!) 25 (!) 22  20  ?Temp: 98.3 ?F (36.8 ?C)   97.6 ?F (36.4 ?C)  ?TempSrc: Oral   Oral  ?SpO2: 100% 98% 100%   ?Weight:    99.6 kg  ?Height:      ? ?Physical Exam ?General: obese woman lying in bed looking tired but nontoxic ?Heart: RRR ?Lungs: clear ?Abdomen: soft, obese ?Extremities: 1+ tibial edema - better than yesterday, trace thigh ?Dialysis Access:  [facility] c/d/I some bruising but no erythema ? ?Additional Objective ?Labs: ?Basic Metabolic Panel: ?Recent Labs  ?Lab 04/23/21 ?0246 04/24/21 ?0215 04/25/21 ?0256  ?NA 126* 133* 132*  ?K 5.4* 4.3 4.3  ?CL 89* 95* 96*  ?CO2 '26 24 24  '$ ?GLUCOSE 105* 100* 106*  ?BUN 82* 36* 53*  ?CREATININE 5.36* 2.88* 4.27*  ?CALCIUM 9.0 9.2 9.3  ?PHOS 6.4* 4.1 4.0  ? ? ?Liver Function Tests: ?Recent Labs  ?Lab 04/20/21 ?0322 04/21/21 ?4010 04/23/21 ?0246 04/24/21 ?0215 04/25/21 ?0256  ?AST 25  --  13* 18  --   ?ALT 23  --  18 16  --   ?ALKPHOS 142*  --  135* 117  --   ?BILITOT 0.8  --  0.4 0.8  --   ?PROT 6.7  --  6.2* 6.2*  --   ?ALBUMIN 2.2*   < > 2.3* 2.3* 2.4*  ? < > = values in this interval not displayed.  ? ? ?No results for input(s): LIPASE, AMYLASE in the last 168 hours. ?CBC: ?Recent Labs  ?Lab 04/19/21 ?2725 04/19/21 ?2100 04/20/21 ?3664 04/23/21 ?0246 04/24/21 ?0215  ?WBC 11.2* 8.7 9.5 9.1 8.4  ?NEUTROABS 9.4*  --  7.5 7.8* 6.9  ?HGB 9.5* 9.0* 8.6* 8.1* 8.5*  ?HCT 30.1* 29.6* 27.5* 25.8* 27.8*  ?MCV 93.2 95.2 94.8 96.6 98.6  ?PLT 178 174 167 138* 146*  ? ? ?Blood Culture ?   ?Component Value Date/Time  ? SDES IN/OUT CATH URINE 04/22/2021 1522  ? SPECREQUEST  04/22/2021 1522  ?   NONE ?Performed at [facility], [place] [address] [place], [place] [address] ?  ? CULT >=100,000 COLONIES/mL PROTEUS MIRABILIS (A) 04/22/2021 1522  ? REPTSTATUS 04/24/2021 FINAL 04/22/2021 1522  ? ? ?Cardiac Enzymes: ?No results for input(s): CKTOTAL, CKMB, CKMBINDEX, TROPONINI in the last 168 hours. ?CBG: ?Recent Labs  ?Lab 04/24/21 ?0808 04/24/21 ?1207 04/24/21 ?1556 04/24/21 ?1928 04/25/21 ?0749  ?GLUCAP 102* 148* 215* 167* 137*  ? ? ?Iron Studies:  ?No results for input(s): IRON, TIBC, TRANSFERRIN, FERRITIN in the last 72 hours. ? ?'@lablastinr3'$ @ ?Studies/Results: ?No results found. ?Medications: ? sodium chloride    ? ? allopurinol  100 mg Oral Daily  ? aspirin  81 mg Oral Daily  ? calcium acetate  667 mg Oral TID WC  ? Chlorhexidine Gluconate Cloth  6 each Topical Q0600  ? clopidogrel  75 mg Oral Daily  ? cycloSPORINE  1 drop Both Eyes BID  ? darbepoetin (ARANESP) injection - DIALYSIS  100 mcg Intravenous Q  Tue-HD  ? heparin injection (subcutaneous)  5,000 Units Subcutaneous Q8H  ? hydrocortisone cream   Topical BID  ? insulin aspart  0-6 Units Subcutaneous TID WC  ? lidocaine  1 patch Transdermal Q24H  ? mouth rinse  15 mL Mouth Rinse q12n4p  ? midodrine  10 mg Oral 3 times per day on Tue Thu Sat  ? mometasone-formoterol  2 puff Inhalation BID  ? pantoprazole sodium  40 mg Oral BID  ? polyethylene glycol  17 g Oral BID  ? polyvinyl alcohol  1 drop Both Eyes Q12H  ? predniSONE  10 mg Oral Q breakfast  ? rosuvastatin  5 mg Oral QPM  ? senna-docusate  2 tablet Oral BID  ? simethicone  80 mg Oral QID  ? sodium chloride flush  10-40 mL Intracatheter Q12H  ? ? ?Assessment/Plan: ?**AKI on CKD3:  baseline Cr in the 1.4-1.8 range now with anuric severe AKI following THR.  Renal US 2/25 CKD w/o obstruction. Thought to be ATN in the setting of post op hypotension.  CRRT 3/2 then converted to HD and continues to require ongoing HD.  On a TTS schedule at this time.  Now on midodrine, continue.  Daily labs eval for  recovery; says UOP increasing but incontinent so purewick has been ordered.  CLIP AKI underway.  ? ?**Anemia:  Hb in 8-9s, sat 12% so IV iron and ESA ordered.   ? ?**hyponatremia:  mild, hypervolemic + impaired free water excretion with oliguric AKI, stable/improving with HD.  ? ?**Hyperkalemia: mild, will correct with HD.  Renal diet ordered.  ? ?**BMM:  ca and phos ok - on  binder.  PTH pending. ? ?**S/p hip fracture - operative repair 2/22 ? ?[facility] TTS 6am arranged.  Home vs SNF pending, hopefully [place] plans will still work out as it seems dispo plan changed.    ? ?Jannifer Hick MD ?04/25/2021, 8:43 AM  ?[facility] ?Pager: [phone] ? ? ?

## 2021-04-25 NOTE — Progress Notes (Signed)
?PROGRESS NOTE ? ?[name] GEX:[id number] DOB: 1945-09-07 DOA: 04/03/2021 ?PCP: Lujean Amel, MD ? ? LOS: 22 days  ? ?Brief Narrative / Interim history: ?The patient is 76 year old obese female with known OSA OHS O2 dependent at 3 L nasal cannula who fell and suffered a fracture left femur, treated with ORIF.  She developed worsening renal failure while in the hospital required a HD catheter that was tunneled and placed on 04/11/2021 per interventional radiology.  On 04/12/2021 she had worsening respiratory failure she was obtunded and was transferred to intensive care unit for further evaluation and treatment by pulmonary critical care and started on CRRT.  He was transferred back to the hospitalist service and has remained stable, awaiting placement/outpatient HD ? ?Subjective / 24h Interval events: ?She tells me that her belly feels better today.  Also reports that the RN yesterday told her that "there is blood down there", but patient does not know if blood was in her stool or what ? ?Assesement and Plan: ?Principal Problem: ?  Closed left hip fracture ([place]) secondary to fall at home ?Active Problems: ?  Back pain ?  Thrombocytopenia ([place]) ?  ANEMIA ?  HTN (hypertension), benign ?  Mixed hypercholesterolemia and hypertriglyceridemia ?  Abdominal pain ?  S/P CABG x 5 ?  Chronic diastolic CHF (congestive heart failure) ([place]) ?  CHB (complete heart block) (HCC) ?  Diabetes mellitus without complication ([place]) ?  Respiratory failure ([place]) ?  Closed fracture of left superior pubic ramus (HCC) ?  Chronic kidney disease, stage III B (moderate) (HCC) ?  Fall at home, initial encounter ?  Hypokalemia ?  Pressure injury of skin ? ? ?Assessment and Plan: ?Principal problem ?Closed left hip fracture ([place]) secondary to fall at home -Patient presents after having a fall found to have a left hip and left superior pubic ramus fracture.  Orthopedic surgery consulted, she is status post ORIF.  Needs SNF, placement  pending ? ?Active problems ?Back pain -Complaining of back Pain but has not been OOB much.  Thoracic spine x-ray without acute findings but diffuse intervertebral ankylosis with generalized osteopenia.  Conservative management, pain control ? ?Thrombocytopenia (HCC) -mild, stable.  Monitor carefully given patient's reports of concern for some bleeding. ? ?Hypokalemia -monitor and replete as indicated ? ?Acute kidney injury on chronic kidney disease, stage III B, now ESRD-nephrology following, currently on dialysis, will need a dialysis spot prior to discharge ? ?Closed fracture of left superior pubic ramus (HCC) -Secondary to fall.  No surgical intervention recommended for treatment of this fracture. ? ?Acute on chronic hypoxic hypoxic hypercapnic respiratory failure -Patient has history of obesity hypoventilation syndrome and OSA.  She is chronically on 3 L of oxygen at baseline.  Respiratory status remains at baseline ? ?Diabetes mellitus without complication (HCC) -Last hemoglobin A1c was 6.5 less than 3 months ago.  Patient is on Jardiance 10 mg daily. ? ?CBG (last 3)  ?Recent Labs  ?  04/24/21 ?1556 04/24/21 ?1928 04/25/21 ?0749  ?GLUCAP 215* 167* 137*  ? ? ?CHB (complete heart block) (HCC) -S/p PPM/ICD  ? ?Chronic diastolic CHF (congestive heart failure) (HCC) -Chronic.  Fluid management per HD, undergoing dialysis today ? ?S/P CABG x 5 -Last cardiac catheterization was in 02/2019 where all territories were noted to have adequate coronary flow via bypasses or native vessels with stents. Continue aspirin, Plavix, and Crestor ? ?Abdominal pain -CT scan without acute findings.  Scheduled simethicone yesterday, reports improvement today.  Continue current regimen ? ?  Mixed hypercholesterolemia and hypertriglyceridemia -Continue Crestor ? ?HTN (hypertension), benign -continue regimen as below, blood pressure has been stable ? ?Anemia in the setting of renal disease-monitor CBC, again some concern for bleeding given  patient's reports, continue to closely monitor.  Discussed with the bedside RN ? ?Scheduled Meds: ? allopurinol  100 mg Oral Daily  ? aspirin  81 mg Oral Daily  ? calcium acetate  667 mg Oral TID WC  ? Chlorhexidine Gluconate Cloth  6 each Topical Q0600  ? clopidogrel  75 mg Oral Daily  ? cycloSPORINE  1 drop Both Eyes BID  ? darbepoetin (ARANESP) injection - DIALYSIS  100 mcg Intravenous Q Tue-HD  ? heparin injection (subcutaneous)  5,000 Units Subcutaneous Q8H  ? hydrocortisone cream   Topical BID  ? insulin aspart  0-6 Units Subcutaneous TID WC  ? lidocaine  1 patch Transdermal Q24H  ? mouth rinse  15 mL Mouth Rinse q12n4p  ? midodrine  10 mg Oral 3 times per day on Tue Thu Sat  ? mometasone-formoterol  2 puff Inhalation BID  ? pantoprazole sodium  40 mg Oral BID  ? polyethylene glycol  17 g Oral BID  ? polyvinyl alcohol  1 drop Both Eyes Q12H  ? predniSONE  10 mg Oral Q breakfast  ? rosuvastatin  5 mg Oral QPM  ? senna-docusate  2 tablet Oral BID  ? simethicone  80 mg Oral QID  ? sodium chloride flush  10-40 mL Intracatheter Q12H  ? ?Continuous Infusions: ? sodium chloride    ? ?PRN Meds:.acetaminophen, albuterol, alum & mag hydroxide-simeth, bisacodyl, guaiFENesin, hydrALAZINE, HYDROcodone-acetaminophen, melatonin, ondansetron (ZOFRAN) IV, sodium chloride, sodium chloride flush ? ?Diet Orders (From admission, onward)  ? ?  Start     Ordered  ? 04/14/21 1116  Diet renal with fluid restriction Fluid restriction: 1200 mL Fluid; Room service appropriate? Yes; Fluid consistency: Thin  Diet effective now       ?Question Answer Comment  ?Fluid restriction: 1200 mL Fluid   ?Room service appropriate? Yes   ?Fluid consistency: Thin   ?  ? 04/14/21 1115  ? ?  ?  ? ?  ? ? ?DVT prophylaxis: heparin injection 5,000 Units Start: 04/15/21 1400 ? ? ?Lab Results  ?Component Value Date  ? PLT 146 (L) 04/24/2021  ? ? ?  Code Status: Full Code ? ?Family Communication: no family at bedside  ? ?Status is: Inpatient ? ?Remains  inpatient appropriate because: waiting for placement ? ?Level of care: Progressive ? ?Consultants:  ?Nephrology  ? ?Procedures:  ?none ? ?Microbiology  ?none ? ?Antimicrobials: ?none  ? ? ?Objective: ?Vitals:  ? 04/25/21 0830 04/25/21 0841 04/25/21 0900 04/25/21 0930  ?BP: (!) 111/50 110/69 125/65 107/63  ?Pulse:  72 80 79  ?Resp: '20 18 20 18  '$ ?Temp: 97.6 ?F (36.4 ?C)     ?TempSrc: Oral     ?SpO2:  99% 99% 99%  ?Weight: 99.6 kg     ?Height:      ? ? ?Intake/Output Summary (Last 24 hours) at 04/25/2021 1001 ?Last data filed at 04/24/2021 2045 ?Gross per 24 hour  ?Intake 240 ml  ?Output 0 ml  ?Net 240 ml  ? ? ?Wt Readings from Last 3 Encounters:  ?04/25/21 99.6 kg  ?04/01/21 103 kg  ?03/18/21 103.6 kg  ? ? ?Examination: ? ?Constitutional: NAD ?Eyes: lids and conjunctivae normal, no scleral icterus ?ENMT: mmm ?Neck: normal, supple ?Respiratory: clear to auscultation bilaterally, no wheezing, no crackles. Normal respiratory effort.  ?  Cardiovascular: Regular rate and rhythm, no murmurs / rubs / gallops. No LE edema. ?Abdomen: soft, no distention, no tenderness. Bowel sounds positive.  ?Skin: no rashes ?Neurologic: no focal deficits, equal strength ? ?Data Reviewed: I have independently reviewed following labs and imaging studies  ? ?CBC ?Recent Labs  ?Lab 04/18/21 ?1002 04/19/21 ?3267 04/19/21 ?2100 04/20/21 ?1245 04/23/21 ?0246 04/24/21 ?0215  ?WBC 10.8* 11.2* 8.7 9.5 9.1 8.4  ?HGB 8.7* 9.5* 9.0* 8.6* 8.1* 8.5*  ?HCT 27.1* 30.1* 29.6* 27.5* 25.8* 27.8*  ?PLT 177 178 174 167 138* 146*  ?MCV 93.1 93.2 95.2 94.8 96.6 98.6  ?MCH 29.9 29.4 28.9 29.7 30.3 30.1  ?MCHC 32.1 31.6 30.4 31.3 31.4 30.6  ?RDW 16.1* 16.4* 16.9* 17.0* 17.6* 18.0*  ?LYMPHSABS 0.8 0.7  --  1.0 0.7 0.8  ?MONOABS 0.6 0.8  --  0.8 0.5 0.6  ?EOSABS 0.0 0.0  --  0.1 0.0 0.0  ?BASOSABS 0.0 0.0  --  0.0 0.0 0.0  ? ? ? ?Recent Labs  ?Lab 04/19/21 ?8099 04/19/21 ?2057 04/20/21 ?0322 04/21/21 ?8338 04/22/21 ?2505 04/23/21 ?0246 04/24/21 ?0215 04/25/21 ?0256  ?NA  133*   < > 133* 133* 130* 126* 133* 132*  ?K 3.7   < > 4.1 4.0 4.5 5.4* 4.3 4.3  ?CL 96*   < > 94* 96* 93* 89* 95* 96*  ?CO2 21*   < > '26 25 26 26 24 24  '$ ?GLUCOSE 122*   < > 95 117* 129* 105* 100* 106*  ?BUN 43*   < > 67*

## 2021-04-25 NOTE — Progress Notes (Signed)
RT note. ?Patient bipap QHS, patient refused last night. Patient currently on 3 L [place] (home setting ) sat 100% with no labored breathing noted. RT will continue to monitor. ?

## 2021-04-25 NOTE — Plan of Care (Signed)
?  Problem: Coping: ?Goal: Level of anxiety will decrease ?Outcome: Progressing ?  ?Problem: Safety: ?Goal: Ability to remain free from injury will improve ?Outcome: Progressing ?  ?

## 2021-04-25 NOTE — TOC Progression Note (Addendum)
Transition of Care (TOC) - Progression Note  ? ? ?Patient Details  ?Name: [name] ?MRN: [id number] ?Date of Birth: 12-07-1945 ? ?Transition of Care (TOC) CM/SW Contact  ?Joanne Chars, LCSW ?Phone Number: ?04/25/2021, 1:29 PM ? ?Clinical Narrative:   CSW spoke with [place] at [facility].  She did speak to son, he is going to come for tour.  But she also spoke to administrator and they can only accept pt on MWF HD schedule.  They cannot to TTS, even if family is committing to do the Saturday transport. ? ?CSW spoke with [facility] and [facility]--both can handle TTS schedule for HD, [facility] needs time to be no earlier than 10am.  CSW reached out to [place] to ask if they can to TTS, have not heard back.  ? ?1320: CSW attempted to call son [name] with this update, no answer, LM.  ? ?1500: [facility] can accept pt. ? ?1530: CSW met with pt and both sons in room, they would like to choose [facility].  [facility] informed, aware pt is new HD, pt set up at [facility] TTS with 6am seat time.  [place] agreeable to this and will facilitate transportation.   ? ?Auth submitted in [place] and approved: [id number], 5 days: 3/17-3/21.   ? ?Expected Discharge Plan: [place] ?Barriers to Discharge: Continued Medical Work up, SNF Pending bed offer ? ?Expected Discharge Plan and Services ?Expected Discharge Plan: [place] ?In-house Referral: Clinical Social Work ?  ?Post Acute Care Choice: [place] ?Living arrangements for the past 2 months: [place] ?                ?  ?  ?  ?  ?  ?  ?  ?  ?  ?  ? ? ?Social Determinants of Health (SDOH) Interventions ?  ? ?Readmission Risk Interventions ?Readmission Risk Prevention Plan 01/10/2021  ?Transportation Screening Complete  ?PCP or Specialist Appt within 3-5 Days Complete  ?[place] or Home Care Consult Complete  ?Social Work Consult for [place] Planning/Counseling Complete  ?Palliative Care Screening Not Applicable   ?Medication Review Press photographer) Complete  ?Some recent data might be hidden  ? ? ?

## 2021-04-25 NOTE — Progress Notes (Signed)
Pt didn't sleep well per shift. Pt finally went to sleep around 4-5AM. Pt not bothered, meds pushed back.  ?

## 2021-04-25 NOTE — Progress Notes (Signed)
Patient adamantly refuses CPAP/BiPap.  She is currently on 3L nasal cannula.  Overnight pulse oximetry study started at this time.  HR 67, sat 92%  ?

## 2021-04-26 ENCOUNTER — Inpatient Hospital Stay (HOSPITAL_COMMUNITY): Payer: Medicare Other

## 2021-04-26 DIAGNOSIS — K5901 Slow transit constipation: Secondary | ICD-10-CM

## 2021-04-26 DIAGNOSIS — S72002A Fracture of unspecified part of neck of left femur, initial encounter for closed fracture: Secondary | ICD-10-CM | POA: Diagnosis not present

## 2021-04-26 LAB — COMPREHENSIVE METABOLIC PANEL
ALT: 19 U/L (ref 0–44)
AST: 20 U/L (ref 15–41)
Albumin: 2.3 g/dL — ABNORMAL LOW (ref 3.5–5.0)
Alkaline Phosphatase: 112 U/L (ref 38–126)
Anion gap: 9 (ref 5–15)
BUN: 31 mg/dL — ABNORMAL HIGH (ref 8–23)
CO2: 27 mmol/L (ref 22–32)
Calcium: 9.2 mg/dL (ref 8.9–10.3)
Chloride: 97 mmol/L — ABNORMAL LOW (ref 98–111)
Creatinine, Ser: 3.14 mg/dL — ABNORMAL HIGH (ref 0.44–1.00)
GFR, Estimated: 15 mL/min — ABNORMAL LOW (ref >60)
Glucose, Bld: 109 mg/dL — ABNORMAL HIGH (ref 70–99)
Potassium: 4.4 mmol/L (ref 3.5–5.1)
Sodium: 133 mmol/L — ABNORMAL LOW (ref 135–145)
Total Bilirubin: 0.5 mg/dL (ref 0.3–1.2)
Total Protein: 5.8 g/dL — ABNORMAL LOW (ref 6.5–8.1)

## 2021-04-26 LAB — RENAL FUNCTION PANEL
Albumin: 2.3 g/dL — ABNORMAL LOW (ref 3.5–5.0)
Anion gap: 9 (ref 5–15)
BUN: 31 mg/dL — ABNORMAL HIGH (ref 8–23)
CO2: 27 mmol/L (ref 22–32)
Calcium: 9.2 mg/dL (ref 8.9–10.3)
Chloride: 97 mmol/L — ABNORMAL LOW (ref 98–111)
Creatinine, Ser: 3.15 mg/dL — ABNORMAL HIGH (ref 0.44–1.00)
GFR, Estimated: 15 mL/min — ABNORMAL LOW (ref >60)
Glucose, Bld: 108 mg/dL — ABNORMAL HIGH (ref 70–99)
Phosphorus: 4.3 mg/dL (ref 2.5–4.6)
Potassium: 4.4 mmol/L (ref 3.5–5.1)
Sodium: 133 mmol/L — ABNORMAL LOW (ref 135–145)

## 2021-04-26 LAB — GLUCOSE, CAPILLARY
Glucose-Capillary: 89 mg/dL (ref 70–99)
Glucose-Capillary: 134 mg/dL — ABNORMAL HIGH (ref 70–99)
Glucose-Capillary: 164 mg/dL — ABNORMAL HIGH (ref 70–99)

## 2021-04-26 LAB — CBC
HCT: 26.1 % — ABNORMAL LOW (ref 36.0–46.0)
Hemoglobin: 8 g/dL — ABNORMAL LOW (ref 12.0–15.0)
MCH: 30.4 pg (ref 26.0–34.0)
MCHC: 30.7 g/dL (ref 30.0–36.0)
MCV: 99.2 fL (ref 80.0–100.0)
Platelets: 164 10*3/uL (ref 150–400)
RBC: 2.63 MIL/uL — ABNORMAL LOW (ref 3.87–5.11)
RDW: 18.7 % — ABNORMAL HIGH (ref 11.5–15.5)
WBC: 3.5 10*3/uL — ABNORMAL LOW (ref 4.0–10.5)
nRBC: 0.6 % — ABNORMAL HIGH (ref 0.0–0.2)

## 2021-04-26 LAB — PTH, INTACT AND CALCIUM
Calcium, Total (PTH): 9.3 mg/dL (ref 8.7–10.3)
PTH: 36 pg/mL (ref 15–65)

## 2021-04-26 LAB — MAGNESIUM: Magnesium: 2.2 mg/dL (ref 1.7–2.4)

## 2021-04-26 MED ORDER — POLYETHYLENE GLYCOL 3350 17 GM/SCOOP PO POWD
0.5000 | Freq: Once | ORAL | Status: AC
  Administered 2021-04-26: 127.5 g via ORAL
  Filled 2021-04-26: qty 255

## 2021-04-26 MED ORDER — METOCLOPRAMIDE HCL 5 MG PO TABS
5.0000 mg | ORAL_TABLET | Freq: Two times a day (BID) | ORAL | Status: AC
Start: 2021-04-26 — End: 2021-04-27
  Administered 2021-04-26 – 2021-04-27 (×3): 5 mg via ORAL
  Filled 2021-04-26 (×3): qty 1

## 2021-04-26 MED ORDER — BISACODYL 5 MG PO TBEC
15.0000 mg | DELAYED_RELEASE_TABLET | Freq: Once | ORAL | Status: AC
Start: 2021-04-26 — End: 2021-04-26
  Administered 2021-04-26: 15 mg via ORAL
  Filled 2021-04-26: qty 3

## 2021-04-26 MED ORDER — SUCRALFATE 1 G PO TABS
1.0000 g | ORAL_TABLET | Freq: Three times a day (TID) | ORAL | Status: DC
  Administered 2021-04-26 – 2021-04-28 (×9): 1 g via ORAL
  Filled 2021-04-26 (×9): qty 1

## 2021-04-26 NOTE — Progress Notes (Signed)
?[facility] ?Progress Note  ? ?Subjective: No urine output documented.  Patient states she feels okay but is having some abdominal pain.  Tolerated dialysis yesterday with no issues. ? ? ?Objective ?Vitals:  ? 04/25/21 2357 04/26/21 0420 04/26/21 0754 04/26/21 0900  ?BP: 139/66 127/63  130/68  ?Pulse: 75 78  (!) 59  ?Resp: '16 16  20  '$ ?Temp: 98.5 ?F (36.9 ?C) 98 ?F (36.7 ?C)    ?TempSrc: Axillary Oral    ?SpO2: 98% 99% 97% 100%  ?Weight:      ?Height:      ? ?Physical Exam ?General: obese woman lying in bed, no distress ?Heart: Normal rate ?Lungs: Bilateral chest rise with no increased work of breathing ?Abdomen: soft, obese ?Extremities: 1+ bilateral lower extremity edema, warm and well perfused ?Dialysis Access:  [facility] c/d/I some bruising but no erythema ? ?Additional Objective ?Labs: ?Basic Metabolic Panel: ?Recent Labs  ?Lab 04/24/21 ?0215 04/25/21 ?0256 04/26/21 ?0233  ?NA 133* 132* 133*  133*  ?K 4.3 4.3 4.4  4.4  ?CL 95* 96* 97*  97*  ?CO2 '24 24 27  27  '$ ?GLUCOSE 100* 106* 109*  108*  ?BUN 36* 53* 31*  31*  ?CREATININE 2.88* 4.27* 3.14*  3.15*  ?CALCIUM 9.2 9.3 9.2  9.2  ?PHOS 4.1 4.0 4.3  ? ?Liver Function Tests: ?Recent Labs  ?Lab 04/23/21 ?0246 04/24/21 ?0215 04/25/21 ?0256 04/26/21 ?9233  ?AST 13* 18  --  20  ?ALT 18 16  --  19  ?ALKPHOS 135* 117  --  112  ?BILITOT 0.4 0.8  --  0.5  ?PROT 6.2* 6.2*  --  5.8*  ?ALBUMIN 2.3* 2.3* 2.4* 2.3*  2.3*  ? ?No results for input(s): LIPASE, AMYLASE in the last 168 hours. ?CBC: ?Recent Labs  ?Lab 04/19/21 ?2100 04/20/21 ?0322 04/23/21 ?0246 04/24/21 ?0215 04/26/21 ?0076  ?WBC 8.7 9.5 9.1 8.4 3.5*  ?NEUTROABS  --  7.5 7.8* 6.9  --   ?HGB 9.0* 8.6* 8.1* 8.5* 8.0*  ?HCT 29.6* 27.5* 25.8* 27.8* 26.1*  ?MCV 95.2 94.8 96.6 98.6 99.2  ?PLT 174 167 138* 146* 164  ? ?Blood Culture ?   ?Component Value Date/Time  ? SDES IN/OUT CATH URINE 04/22/2021 1522  ? SPECREQUEST  04/22/2021 1522  ?  NONE ?Performed at [facility], [place] [address],  [place], [place] [address] ?  ? CULT >=100,000 COLONIES/mL PROTEUS MIRABILIS (A) 04/22/2021 1522  ? REPTSTATUS 04/24/2021 FINAL 04/22/2021 1522  ? ? ?Cardiac Enzymes: ?No results for input(s): CKTOTAL, CKMB, CKMBINDEX, TROPONINI in the last 168 hours. ?CBG: ?Recent Labs  ?Lab 04/25/21 ?0749 04/25/21 ?1414 04/25/21 ?1601 04/25/21 ?1949 04/26/21 ?0736  ?GLUCAP 137* 86 112* 134* 89  ? ?Iron Studies:  ?No results for input(s): IRON, TIBC, TRANSFERRIN, FERRITIN in the last 72 hours. ? ?'@lablastinr3'$ @ ?Studies/Results: ?VAS [place] MESENTERIC ? ?Result Date: 04/26/2021 ?ABDOMINAL VISCERAL Patient Name:  [name]  Date of Exam:   04/26/2021 Medical Rec #: [id number]          Accession #:    [id number] Date of Birth: [birth year]           Patient Gender: F Patient Age:   76 years Exam Location:  [facility] Procedure:      VAS [place] MESENTERIC Referring Phys: [place] -------------------------------------------------------------------------------- Indications: Abdomen pain              Insufficiency mesenteric High Risk Factors: Hypertension, Diabetes, coronary artery disease. Other  Factors: CABG x5                Renal failure. Limitations: Obesity and air/bowel gas. Comparison Study: No prior. Performing Technologist: Oda Cogan RDMS, RVT  Examination Guidelines: A complete evaluation includes B-mode imaging, spectral Doppler, color Doppler, and power Doppler as needed of all accessible portions of each vessel. Bilateral testing is considered an integral part of a complete examination. Limited examinations for reoccurring indications may be performed as noted.  Duplex Findings: +----------------------+--------+--------+------+--------------+ Mesenteric            PSV cm/sEDV cm/sPlaque   Comments    +----------------------+--------+--------+------+--------------+ Aorta Prox               76                                +----------------------+--------+--------+------+--------------+ Celiac  Artery Origin    139                                +----------------------+--------+--------+------+--------------+ Celiac Artery Proximal  130                                +----------------------+--------+--------+------+--------------+ SMA Origin              277                                +----------------------+--------+--------+------+--------------+ SMA Proximal            252                                +----------------------+--------+--------+------+--------------+ SMA Mid                 188                                +----------------------+--------+--------+------+--------------+ SMA Distal              191                                +----------------------+--------+--------+------+--------------+ IMA                                         Not visualized +----------------------+--------+--------+------+--------------+    Summary: Mesenteric: Normal Celiac artery findings. Mildly elevated velocities in the proximal SMA suggestive of less than 70% stenosis.  *See table(s) above for measurements and observations.     Preliminary    ?Medications: ? sodium chloride    ? ? allopurinol  100 mg Oral Daily  ? aspirin  81 mg Oral Daily  ? bisacodyl  15 mg Oral Once  ? calcium acetate  667 mg Oral TID WC  ? Chlorhexidine Gluconate Cloth  6 each Topical Q0600  ? clopidogrel  75 mg Oral Daily  ? cycloSPORINE  1 drop Both Eyes BID  ? darbepoetin (ARANESP) injection - DIALYSIS  100 mcg Intravenous Q Tue-HD  ? heparin injection (subcutaneous)  5,000 Units Subcutaneous Q8H  ?  hydrocortisone cream   Topical BID  ? insulin aspart  0-6 Units Subcutaneous TID WC  ? lidocaine  1 patch Transdermal Q24H  ? mouth rinse  15 mL Mouth Rinse q12n4p  ? metoCLOPramide  5 mg Oral BID AC  ? midodrine  10 mg Oral 3 times per day on Tue Thu Sat  ? mometasone-formoterol  2 puff Inhalation BID  ? pantoprazole  40 mg Oral BID  ? polyethylene glycol  17 g Oral BID  ? polyethylene  glycol powder  0.5 Container Oral Once  ? polyvinyl alcohol  1 drop Both Eyes Q12H  ? predniSONE  10 mg Oral Q breakfast  ? rosuvastatin  5 mg Oral QPM  ? senna-docusate  2 tablet Oral BID  ? simethicone  80 mg Oral QID  ? sodium chloride flush  10-40 mL Intracatheter Q12H  ? sucralfate  1 g Oral TID WC & HS  ? ? ?Assessment/Plan: ?**AKI on CKD3:  baseline Cr in the 1.4-1.8 range now with anuric severe AKI following THR.  Renal US 2/25 CKD w/o obstruction. Thought to be ATN in the setting of post op hypotension.  CRRT 3/2 then converted to HD and continues to require ongoing HD.  On a TTS schedule at this time.  Now on midodrine, continue.  Daily labs eval for recovery; says UOP increasing but incontinent so purewick has been ordered.  Urine output is not being documented very well.  Has been accepted for outpatient unit. ? ?**Anemia:  Hb in 8-9s, sat 12% so IV iron and ESA ordered.   ? ?**hyponatremia:  mild, hypervolemic + impaired free water excretion with oliguric AKI, stable/improving with HD.  ? ?**Hyperkalemia: Resolved with dialysis ? ?**BMM:  ca and phos ok - on  binder.  PTH ordered ? ?**S/p hip fracture - operative repair 2/22 ? ?[facility] TTS 6am arranged.  Home vs SNF pending, hopefully [facility] plans will still work out as it seems dispo plan changed.    ? ? ? ? ?

## 2021-04-26 NOTE — Progress Notes (Signed)
Occupational Therapy Treatment ?Patient Details ?Name: [name] ?MRN: [id number] ?DOB: 1945-06-15 ?Today's Date: 04/26/2021 ? ? ?History of present illness Pt is a 76 y.o. female admitted 04/03/21 after fall at home when leg gave out sustaining L displaced intertrochanteric hip fx. S/p L hip cephalomedullary nail and closed management of superior pubic ramus fx 2/23. On 3/3, pt with worsening respiratory status and encephalopathy, transferred to ICU and started on BiPAP. Pt with acute on chronic renal failure with anuria; CRRT 3/3-3/5; iHD initiated 3/7. PMH includes afib, CAD, CHF, HTN, pacemaker, asthma, DM2, stroke, OSA, cervical sx, back sx, R ankle fx. ?  ?OT comments ? Pt. Seen for skilled OT session.  Pt. Intially self limiting "I cant". With explanation of the plan for session and review of need for consistent participation in prep for home with family pt. Able to focus and engage in session with more active pursuits.  Min a for bed mobility with cues for sequencing.  Mod a for sit/stand, mod a x2 for step pivot to recliner.  Pt. With notable excitement at what she had completed during session.  Continue to progress mobility/adls next session.    ? ?Recommendations for follow up therapy are one component of a multi-disciplinary discharge planning process, led by the attending physician.  Recommendations may be updated based on patient status, additional functional criteria and insurance authorization. ?   ?Follow Up Recommendations ? Skilled nursing-short term rehab (<3 hours/day)  ?  ?Assistance Recommended at Discharge    ?Patient can return home with the following ? Two people to help with walking and/or transfers;Two people to help with bathing/dressing/bathroom;Assist for transportation;Help with stairs or ramp for entrance;Assistance with cooking/housework ?  ?Equipment Recommendations ? None recommended by OT;Other (comment)  ?  ?Recommendations for Other Services   ? ?  ?Precautions /  Restrictions Precautions ?Precautions: Fall;Other (comment) ?Precaution Comments: 3L O2 baseline ?Restrictions ?LLE Weight Bearing: Weight bearing as tolerated  ? ? ?  ? ?Mobility Bed Mobility ?Overal bed mobility: Needs Assistance ?Bed Mobility: Rolling, Sidelying to Sit ?Rolling: Min assist ?  ?Supine to sit: Min assist ?  ?  ?General bed mobility comments: cues for pt. to attempt and engage, reaching for therapist asst. to pull her up, with encouragement able to use bed rails and one step commands to complete the sidelying to sit ?  ? ?Transfers ?Overall transfer level: Needs assistance ?Equipment used: Rolling walker (2 wheels) ?Transfers: Sit to/from Stand, Bed to chair/wheelchair/BSC ?Sit to Stand: Mod assist ?  ?  ?Step pivot transfers: +2 physical assistance, Mod assist ?  ?  ?General transfer comment: pt. sit/stand x2, on 3rd attempt able to step piovt to recliner with 2 person assist (rn present and helped). max encouragement, cues for sequencing and safety ?  ?  ?Balance   ?  ?  ?  ?  ?  ?  ?  ?  ?  ?  ?  ?  ?  ?  ?  ?  ?  ?  ?   ? ?ADL either performed or assessed with clinical judgement  ? ?ADL Overall ADL's : Needs assistance/impaired ?  ?  ?  ?  ?  ?  ?  ?  ?  ?  ?  ?  ?Toilet Transfer: Maximal assistance;+2 for physical assistance;Stand-pivot;Moderate assistance ?Toilet Transfer Details (indicate cue type and reason): eob to recliner ?  ?  ?  ?  ?Functional mobility during ADLs: Moderate assistance;Maximal assistance;+2 for physical assistance;+2  for safety/equipment;Rolling walker (2 wheels);Cueing for sequencing;Cueing for safety ?General ADL Comments: able to complete bed mobility and transfer to recliner ?  ? ?Extremity/Trunk Assessment   ?  ?  ?  ?  ?  ? ?Vision   ?  ?  ?Perception   ?  ?Praxis   ?  ? ?Cognition Arousal/Alertness: Awake/alert ?Behavior During Therapy: Flat affect ?Overall Cognitive Status: No family/caregiver present to determine baseline cognitive functioning ?  ?  ?  ?  ?  ?   ?  ?  ?  ?  ?  ?  ?  ?  ?  ?  ?  ?  ?  ?   ?Exercises   ? ?  ?Shoulder Instructions   ? ? ?  ?General Comments    ? ? ?Pertinent Vitals/ Pain       Pain Assessment ?Pain Assessment: Faces ?Faces Pain Scale: Hurts even more ?Pain Location: abdomen ?Pain Descriptors / Indicators: Aching ?Pain Intervention(s): Repositioned, Limited activity within patient's tolerance, Monitored during session ? ?Home Living   ?  ?  ?  ?  ?  ?  ?  ?  ?  ?  ?  ?  ?  ?  ?  ?  ?  ?  ? ?  ?Prior Functioning/Environment    ?  ?  ?  ?   ? ?Frequency ? Min 2X/week  ? ? ? ? ?  ?Progress Toward Goals ? ?OT Goals(current goals can now be found in the care plan section) ? Progress towards OT goals: Progressing toward goals ? ?   ?Plan Discharge plan remains appropriate   ? ?Co-evaluation ? ? ?   ?  ?  ?  ?  ? ?  ?AM-PAC OT "6 Clicks" Daily Activity     ?Outcome Measure ? ? Help from another person eating meals?: A Little ?Help from another person taking care of personal grooming?: A Little ?Help from another person toileting, which includes using toliet, bedpan, or urinal?: Total ?Help from another person bathing (including washing, rinsing, drying)?: A Little ?Help from another person to put on and taking off regular upper body clothing?: A Little ?Help from another person to put on and taking off regular lower body clothing?: Total ?6 Click Score: 14 ? ?  ?End of Session Equipment Utilized During Treatment: Gait belt;Rolling walker (2 wheels) ? ?OT Visit Diagnosis: Unsteadiness on feet (R26.81);Other abnormalities of gait and mobility (R26.89);Muscle weakness (generalized) (M62.81) ?  ?Activity Tolerance Patient tolerated treatment well ?  ?Patient Left in chair;with call bell/phone within reach ?  ?Nurse Communication Other (comment) (reviewed with rn use of stedy back to bed if pt. unable, reviewed pt. request for shower cap/wash) ?  ? ?   ? ?Time: 8828-0034 ?OT Time Calculation (min): 27 min ? ?Charges: OT General Charges ?$OT Visit: 1  Visit ?OT Treatments ?$Self Care/Home Management : 23-37 mins ? ?Sonia Baller, COTA/L ?Acute Rehabilitation ?[phone]  ? ?Tanya Nones ?04/26/2021, 10:26 AM ?

## 2021-04-26 NOTE — Progress Notes (Signed)
?PROGRESS NOTE ? ?[name] ZMO:[id number] DOB: 1945-11-28 DOA: 04/03/2021 ?PCP: Lujean Amel, MD ? ? LOS: 23 days  ? ?Brief Narrative / Interim history: ?The patient is 76 year old obese female with known OSA OHS O2 dependent at 3 L nasal cannula who fell and suffered a fracture left femur, treated with ORIF.  She developed worsening renal failure while in the hospital required a HD catheter that was tunneled and placed on 04/11/2021 per interventional radiology.  On 04/12/2021 she had worsening respiratory failure she was obtunded and was transferred to intensive care unit for further evaluation and treatment by pulmonary critical care and started on CRRT.  He was transferred back to the hospitalist service and has remained stable, awaiting placement/outpatient HD ? ?Subjective / 24h Interval events: ?Continues to have abdominal pain this morning, worse than her normal.  She is surprised about her pain today because she has not had anything to eat ? ?Assesement and Plan: ?Principal Problem: ?  Closed left hip fracture ([place]) secondary to fall at home ?Active Problems: ?  Back pain ?  Thrombocytopenia ([place]) ?  ANEMIA ?  HTN (hypertension), benign ?  Mixed hypercholesterolemia and hypertriglyceridemia ?  Abdominal pain ?  S/P CABG x 5 ?  Chronic diastolic CHF (congestive heart failure) ([place]) ?  CHB (complete heart block) (H[facility]) ?  Diabetes mellitus without complication ([place]) ?  Respiratory failure ([place]) ?  Closed fracture of left superior pubic ramus (H[facility]) ?  Chronic kidney disease, stage III B (moderate) (H[facility]) ?  Fall at home, initial encounter ?  Hypokalemia ?  Pressure injury of skin ? ? ?Assessment and Plan: ?Principal problem ?Closed left hip fracture ([place]) secondary to fall at home -Patient presents after having a fall found to have a left hip and left superior pubic ramus fracture.  Orthopedic surgery consulted, she is status post ORIF.  Needs SNF, placement pending ? ?Active problems ?Abdominal pain -CT  scan without acute findings.  Somewhat acute on chronic as she saw GI in the past for this.  She tells me that the pain is much worse and especially after she eats.  Discussed with renal, did not recommend a CT angiogram to evaluate for his mesenteric ischemia given that she is not in ESRD yet and there is some hope for recovery of her renal function.  Obtain mesenteric ultrasound today.  GI also consulted. ? ?Acute kidney injury on chronic kidney disease, stage III B, now dialysis dependent-nephrology following, currently on dialysis, she will need to be set up with outpatient dialysis.  Discussed with Dr. Johnney Ou, there is some chance of recovery ? ?Back pain -Complaining of back Pain but has not been OOB much.  Thoracic spine x-ray without acute findings but diffuse intervertebral ankylosis with generalized osteopenia.  Conservative management, pain control ? ?Thrombocytopenia ([place]) -resolved ? ?Hypokalemia -continue to monitor and replete as indicated ? ?Closed fracture of left superior pubic ramus (HCC) -Secondary to fall.  No surgical intervention recommended for treatment of this fracture. ? ?Acute on chronic hypoxic hypoxic hypercapnic respiratory failure -Patient has history of obesity hypoventilation syndrome and OSA.  She is chronically on 3 L of oxygen at baseline.  Respiratory status remains at baseline ? ?Diabetes mellitus without complication (HCC) -Last hemoglobin A1c was 6.5 less than 3 months ago.  Patient is on Jardiance 10 mg daily. ? ?CBG (last 3)  ?Recent Labs  ?  04/25/21 ?1601 04/25/21 ?1949 04/26/21 ?0736  ?GLUCAP 112* 134* 89  ? ? ?CHB (complete heart  block) [facility]) -S/p PPM/ICD  ? ?Chronic diastolic CHF (congestive heart failure) (HCC) -Chronic.  Fluid management per HD, undergoing dialysis per nephrology ? ?S/P CABG x 5 -Last cardiac catheterization was in 02/2019 where all territories were noted to have adequate coronary flow via bypasses or native vessels with stents. Continue aspirin,  Plavix, and Crestor ? ?Mixed hypercholesterolemia and hypertriglyceridemia -Continue Crestor ? ?HTN (hypertension), benign -continue regimen as below, blood pressure has been stable ? ?Anemia in the setting of renal disease-monitor CBC, again some concern for bleeding given patient's reports, continue to closely monitor.  Discussed with the bedside RN ? ?Scheduled Meds: ? allopurinol  100 mg Oral Daily  ? aspirin  81 mg Oral Daily  ? calcium acetate  667 mg Oral TID WC  ? Chlorhexidine Gluconate Cloth  6 each Topical Q0600  ? clopidogrel  75 mg Oral Daily  ? cycloSPORINE  1 drop Both Eyes BID  ? darbepoetin (ARANESP) injection - DIALYSIS  100 mcg Intravenous Q Tue-HD  ? heparin injection (subcutaneous)  5,000 Units Subcutaneous Q8H  ? hydrocortisone cream   Topical BID  ? insulin aspart  0-6 Units Subcutaneous TID WC  ? lidocaine  1 patch Transdermal Q24H  ? mouth rinse  15 mL Mouth Rinse q12n4p  ? midodrine  10 mg Oral 3 times per day on Tue Thu Sat  ? mometasone-formoterol  2 puff Inhalation BID  ? pantoprazole  40 mg Oral BID  ? polyethylene glycol  17 g Oral BID  ? polyvinyl alcohol  1 drop Both Eyes Q12H  ? predniSONE  10 mg Oral Q breakfast  ? rosuvastatin  5 mg Oral QPM  ? senna-docusate  2 tablet Oral BID  ? simethicone  80 mg Oral QID  ? sodium chloride flush  10-40 mL Intracatheter Q12H  ? ?Continuous Infusions: ? sodium chloride    ? ?PRN Meds:.acetaminophen, albuterol, alum & mag hydroxide-simeth, bisacodyl, guaiFENesin, hydrALAZINE, HYDROcodone-acetaminophen, melatonin, ondansetron (ZOFRAN) IV, sodium chloride, sodium chloride flush ? ?Diet Orders (From admission, onward)  ? ?  Start     Ordered  ? 04/26/21 0946  Diet clear liquid Room service appropriate? Yes; Fluid consistency: Thin  Diet effective now       ?Question Answer Comment  ?Room service appropriate? Yes   ?Fluid consistency: Thin   ?  ? 04/26/21 0946  ? ?  ?  ? ?  ? ? ?DVT prophylaxis: heparin injection 5,000 Units Start: 04/15/21  1400 ? ? ?Lab Results  ?Component Value Date  ? PLT 164 04/26/2021  ? ? ?  Code Status: Full Code ? ?Family Communication: no family at bedside  ? ?Status is: Inpatient ? ?Remains inpatient appropriate because: waiting for placement ? ?Level of care: Progressive ? ?Consultants:  ?Nephrology  ? ?Procedures:  ?none ? ?Microbiology  ?none ? ?Antimicrobials: ?none  ? ? ?Objective: ?Vitals:  ? 04/25/21 2050 04/25/21 2357 04/26/21 0420 04/26/21 0754  ?BP:  139/66 127/63   ?Pulse:  75 78   ?Resp:  16 16   ?Temp:  98.5 ?F (36.9 ?C) 98 ?F (36.7 ?C)   ?TempSrc:  Axillary Oral   ?SpO2: 92% 98% 99% 97%  ?Weight:      ?Height:      ? ? ?Intake/Output Summary (Last 24 hours) at 04/26/2021 0947 ?Last data filed at 04/26/2021 0900 ?Gross per 24 hour  ?Intake 600 ml  ?Output 2494 ml  ?Net -1894 ml  ? ? ?Wt Readings from Last 3 Encounters:  ?04/25/21  97.2 kg  ?04/01/21 103 kg  ?03/18/21 103.6 kg  ? ? ?Examination: ? ?Constitutional: NAD ?Eyes: lids and conjunctivae normal, no scleral icterus ?ENMT: mmm ?Neck: normal, supple ?Respiratory: clear to auscultation bilaterally, no wheezing, no crackles. Normal respiratory effort.  ?Cardiovascular: Regular rate and rhythm, no murmurs / rubs / gallops. No LE edema. ?Abdomen: soft, mild tenderness throughout, no guarding or rebound, bowel sounds positive ?Skin: no rashes ?Neurologic: no focal deficits, equal strength ? ?Data Reviewed: I have independently reviewed following labs and imaging studies  ? ?CBC ?Recent Labs  ?Lab 04/19/21 ?2100 04/20/21 ?0322 04/23/21 ?0246 04/24/21 ?0215 04/26/21 ?6578  ?WBC 8.7 9.5 9.1 8.4 3.5*  ?HGB 9.0* 8.6* 8.1* 8.5* 8.0*  ?HCT 29.6* 27.5* 25.8* 27.8* 26.1*  ?PLT 174 167 138* 146* 164  ?MCV 95.2 94.8 96.6 98.6 99.2  ?MCH 28.9 29.7 30.3 30.1 30.4  ?MCHC 30.4 31.3 31.4 30.6 30.7  ?RDW 16.9* 17.0* 17.6* 18.0* 18.7*  ?LYMPHSABS  --  1.0 0.7 0.8  --   ?MONOABS  --  0.8 0.5 0.6  --   ?EOSABS  --  0.1 0.0 0.0  --   ?BASOSABS  --  0.0 0.0 0.0  --   ? ? ? ?Recent Labs   ?Lab 04/20/21 ?0322 04/21/21 ?4696 04/22/21 ?2952 04/23/21 ?0246 04/24/21 ?0215 04/25/21 ?8413 04/26/21 ?2440  ?NA 133*   < > 130* 126* 133* 132* 133*  133*  ?K 4.1   < > 4.5 5.4* 4.3 4.3 4.4  4.4  ?CL 94*   < > 93*

## 2021-04-26 NOTE — Progress Notes (Signed)
OT Cancellation Note ? ?Patient Details ?Name: [name] ?MRN: [id number] ?DOB: 12/04/1945 ? ? ?Cancelled Treatment:    Reason Eval/Treat Not Completed: Patient at procedure or test/ unavailable.  Spoke with RN who reports pt. Preparing for ultrasound very soon.  Agree a later attempt more appropriate.  Will check back as able.   ? ?Clearnce Sorrel [place]-COTA/L ?04/26/2021, 9:40 AM ?

## 2021-04-26 NOTE — Consult Note (Addendum)
? ? ? ? ? Consultation ? ?Referring Provider:   Marzetta Board MD ?Primary Care Physician:  Lujean Amel, MD ?Primary Gastroenterologist:  Dr. Silverio Decamp       ?Reason for Consultation: Postprandial abdominal pain    ? ? Attending physician's note  ?I have taken a history, reviewed the chart and examined the patient. I performed a substantive portion of this encounter, including complete performance of at least one of the key components, in conjunction with the APP. I agree with the APP's note, impression and recommendations.  ? ?76 year old very pleasant female with morbid obesity chronic irritable bowel syndrome, abdominal pain, recently initiated on hemodialysis for end-stage renal disease s/p ORIF for closed left hip fracture s/p fall February 23 with complaints of worsening abdominal pain ?She is experiencing worsening constipation, possible slow transit colon secondary to narcotics ?Also possibly has opiate induced gastroparesis ? ?Abdominal wall fat necrosis also in the differential ?Reviewed CT abdomen pelvis without contrast that was negative for any acute intra-abdominal pathology ? ?She has history of vascular disease and SMA stenosis but is not critical.  She is on maximal medical therapy ? ?Plan for bowel purge with MiraLAX and Dulcolax ?IV Reglan 10 mg IV X1 along with bowel purge ?Advised patient to use lidocaine patches in the epigastric area along with heating and cooling pad as needed for symptom relief especially if her pain is coming from subcutaneous area due to fat necrosis ? ?Patient will benefit from routine bowel regimen, can start Linzess 145 mcg daily to prevent constipation after bowel purge ? ?We will arrange for GI follow-up visit, next available appointment ? ?GI is available if needed, please call with any questions ? ?The patient was provided an opportunity to ask questions and all were answered. The patient agreed with the plan and demonstrated an understanding of the  instructions. ? ?K. Denzil Magnuson , MD ?[phone]    ? ? Impression   ? ?Chronic abdominal pain worsened in setting of constipation, decreased movement, opioid use. ?CT abdomen pelvis unremarkable other than stool burden ?Vascular mesenteric ultrasound  with less than 70% stenosis of SMA ?Patient on prednisone 10 mg daily ?Patient on Protonix 40 mg twice daily, not on outpatient Carafate ?Has been on Senokot 2 tablets since the 15th, simethicone since the 15th. ?Has been getting lidocaine patches for back pain. ? ?Acute renal failure on chronic ?With tunneling catheter currently on dialysis ?Nephrology following ? ?Multiple comorbidities including coronary disease status post bypass ?chronic diastolic heart failure status post AICD ?Patient is on Plavix 75 mg daily and heparin 5000 units every 8 hours. ? ?Chronic hypoxic respiratory failure on oxygen therapy ? ?Anemia after surgery ?without overt bleeding, likely from surgery ?CBC on 04/26/2021   ?WBC 3.5 HGB 8.0 MCV 99.2 Platelets 164 ?Anemia studies on 04/21/2021  ?Iron 39 Ferritin 206 B12 ? ? ? Plan  ? ?Optimize bowel regiment, will do MiraLAX purge with Dulcolax, with continuing MiraLAX 17 g twice daily afterwards and senna. ?Continue Protonix twice daily 40 mg ?Add on Carafate 3 times daily, previously was taking in applesauce. ?Possible gastroparesis component with opioid use, will do very short low-dose of Reglan. ?Can do lidocaine patches on epigastric area as well. ?Monitor hemoglobin ?No need for endoscopic evaluation at this time. ?Encourage patient to get out of bed ? ?Thank you for your kind consultation, we will continue to follow. ?       ? HPI:   ?PRISCA GEARING is a 76 y.o. female with  significant past medical history significant for hypertension, morbid obesity, diabetes, hyperlipidemia, OSA, CKD stage III, CAD status post CABG 2016, CVA secondary to high-grade ICA stenosis status post stent August 2022, CHF status post AICD, COPD with chronic  oxygen use, chronic antiplatelet therapy with Plavix and aspirin.  ?Patient also has longstanding history of intermittent epigastric pain, nausea. ?November 2021 barium swallow showed smooth narrowing of distal esophagus preventing passage of barium tablet, EGD not pursued and then repeat barium swallow and MBS October 2022 showed no evidence of stricture or masses.  Symptoms at that time were controlled with Protonix and Carafate 3 times daily. ?Patient was complaining of epigastric abdominal pain November 2022 at that time thought to be related related to body habitus and muscle strain, relieved with Lidoderm patches. ?Patient has history of chronic constipation managed with daily MiraLAX. ? ?Hospital course: ?Patiently initially admitted 04/03/2021 for closed left hip fracture secondary to fall at home. ?Hemoglobin 10.8 on admission, CO2 33, creatinine 1.69, BNP 333, chest x-ray mild pulmonary vascular congestion. ?Had subsequent intramedullary nail intertrochanteric surgical intervention 04/04/2021 with Dr. Donivan Scull.  ?Developed worsening renal failure requiring HD catheter tunnel placed 04/11/2021 with interventional radiology. ?04/12/2021 worsening respiratory failure obtunded and transferred to ICU ?Has remained stable since that time awaiting placement for outpatient HD. ? ?Patient has been complaining of abdominal discomfort 3/12. ?Had not had bowel movement at that time, bowel regimen increased with bisacodyl suppositories, currently on Senokot and MiraLAX. ?CT abdomen pelvis with oral contrast showed moderate stool burden, no other acute findings. ?Patient's also been having back pain. ? ?Patient states she has been having worsening epigastric abdominal pain since being here, states currently her abdominal pain is bothering her more than her hip pain.   ?Worse after food, some radiation to her back.  Denies worse with movement. ?Patient denies nausea, vomiting, dysphagia. ?Patient's been having  decreased bowel movements, when she does have bowel movement currently more liquids/soft, last 1 documented was last evening 2050, nurse states did have very small soft BM this morning.  Some fecal smearing with increasing gas. ?Last bowel movement was this morning, ?Patient denies chest pain, denies worsening shortness of breath. ? ?Previous GI work up: ?12/05/2020 barium swallow showed stricture in distal esophagus similar to comparison exam however barium tablet passed GE junction, findings suggestive of reflux induced stricture of the distal esophagus.  No mucosal irregularities. ? ?Barium esophagram November 04, 2019: Mild press by esophagus with good primary peristaltic wave.  Smooth narrowing of distal esophagus preventing passage of 13 mm tablet ?  ?EGD and colonoscopy in November 2019.  EGD revealed grade B esophagitis, small hiatal hernia, gastritis, and a small nodule in the stomach.  Gastric biopsies showed no H. pylori.  Showed reactive gastropathy with ulceration.  Colonoscopy revealed polyps that were removed and were tubular adenomas.  Also had diverticulosis and internal/external hemorrhoids. ?  ?EGD December 14, 2006 showed mild antral gastritis otherwise normal exam.  H. pylori CLOtest negative. ?  ?Colonoscopy September 27, 2002 by Dr. Olevia Perches with removal of small diminutive hyperplastic polyps and left-sided diverticulosis ? ? ?Past Medical History:  ?Diagnosis Date  ? ABDOMINAL PAIN -GENERALIZED 02/21/2010  ? Qualifier: Diagnosis of  By: Chester Holstein NP, Nevin Bloodgood    ? AICD (automatic cardioverter/defibrillator) present   ? downgraded to CRT-P in 2014  ? Anemia   ? Arthritis   ? Asthma   ? Atrial fibrillation ([place]) 10/24/2015  ? Questionable history of A Fib/A Flutter. On device interrogation on  9/7, showed 0.28mn of A Fib/A Flutter with 1% burden.  Device check in Jan 2018 showed no sustained AF (runs of less than 30 seconds). No anticoagulation indicated.  ? CAD (coronary artery disease)   ? a. 10/09/16  LHC: SVG->LAD patent, SVG->Diag patent, SVG->RCA patent, SVG->LCx occluded. EF 60%, b. 10/31/16 LHC DES to AV groove Circ, DES to intermed branch  ? Cellulitis and abscess of foot 12/2014  ? RT FOOT  ? CHF (congestive h

## 2021-04-26 NOTE — Progress Notes (Signed)
Physical Therapy Treatment ?Patient Details ?Name: [name] ?MRN: [id number] ?DOB: 1945-06-21 ?Today's Date: 04/26/2021 ? ? ?History of Present Illness Pt is a 76 y.o. female admitted 04/03/21 after fall at home when leg gave out sustaining L displaced intertrochanteric hip fx. S/p L hip cephalomedullary nail and closed management of superior pubic ramus fx 2/23. On 3/3, pt with worsening respiratory status and encephalopathy, transferred to ICU and started on BiPAP. Pt with acute on chronic renal failure with anuria; CRRT 3/3-3/5; iHD initiated 3/7. PMH includes afib, CAD, CHF, HTN, pacemaker, asthma, DM2, stroke, OSA, cervical sx, back sx, R ankle fx. ? ?  ?PT Comments  ? ? Patient is slowly progressing towards goals. She adamantly refused OOB mobility since she transferred to recliner with OT earlier, however, pt willing to perform therapeutic exercise in supine. Pt performed LE AAROM and AROM for muscle activation in supine. Pt required frequent encouragement with therex. Pt will benefit from continued skilled PT to maximize her safety and independence with functional mobility upon D/C to a SNF. ?   ?Recommendations for follow up therapy are one component of a multi-disciplinary discharge planning process, led by the attending physician.  Recommendations may be updated based on patient status, additional functional criteria and insurance authorization. ? ?Follow Up Recommendations ? Skilled nursing-short term rehab (<3 hours/day) ?  ?  ?Assistance Recommended at Discharge Frequent or constant Supervision/Assistance  ?Patient can return home with the following Two people to help with walking and/or transfers;Two people to help with bathing/dressing/bathroom;Assistance with cooking/housework;Assist for transportation;Help with stairs or ramp for entrance ?  ?Equipment Recommendations ? Rolling walker (2 wheels);BSC/3in1;Wheelchair (measurements PT);Wheelchair cushion (measurements PT);Hospital bed;Other  (comment)  ?  ?Recommendations for Other Services   ? ? ?  ?Precautions / Restrictions Precautions ?Precautions: Fall ?Precaution Comments: 3L O2 baseline ?Restrictions ?Weight Bearing Restrictions: Yes ?LLE Weight Bearing: Weight bearing as tolerated  ?  ? ?Mobility ? Bed Mobility ?  ?  ?  ?  ?  ?  ?  ?General bed mobility comments: pt encouraged to attempt mobility, even just sitting up on the EOB. Pt became agitated with increased encouragement from SPT and declined any attempts. Willing to attempt therex in supine, however needed frequent encouragement to engage. ?  ? ?Transfers ?  ?  ?  ?  ?  ?  ?  ?  ?  ?  ?  ? ?Ambulation/Gait ?  ?  ?  ?  ?  ?  ?  ?  ? ? ?Stairs ?  ?  ?  ?  ?  ? ? ?Wheelchair Mobility ?  ? ?Modified Rankin (Stroke Patients Only) ?  ? ? ?  ?Balance   ?  ?  ?  ?  ?  ?  ?  ?  ?  ?  ?  ?  ?  ?  ?  ?  ?  ?  ?  ? ?  ?Cognition Arousal/Alertness: Awake/alert ?Behavior During Therapy: Flat affect ?Overall Cognitive Status: No family/caregiver present to determine baseline cognitive functioning ?Area of Impairment: Following commands, Safety/judgement, Awareness ?  ?  ?  ?  ?  ?  ?  ?  ?  ?  ?  ?Following Commands: Follows one step commands inconsistently ?Safety/Judgement: Decreased awareness of safety, Decreased awareness of deficits ?Awareness: Emergent ?  ?General Comments: pt required assist and encouragement to attempt therex in supine. ?  ?  ? ?  ?Exercises General Exercises - Lower Extremity ?Ankle Circles/Pumps:  Both, 10 reps, Supine ?Quad Sets: Strengthening, 10 reps, Both, Supine ?Gluteal Sets: Supine, 10 reps ?Heel Slides: AAROM, Left, 10 reps, Supine, Other (comment) (AROM on the R x10 reps in supine) ?Hip ABduction/ADduction: 10 reps, Supine (pillow squeezes x10 reps; AAROM hip abduction on L x10 reps; AAROM hip anduction on R x10) ? ?  ?General Comments   ?  ?  ? ?Pertinent Vitals/Pain Pain Assessment ?Pain Assessment: Faces ?Faces Pain Scale: Hurts even more ?Pain Location:  abdomen ?Pain Descriptors / Indicators: Aching ?Pain Intervention(s): Limited activity within patient's tolerance  ? ? ?Home Living   ?  ?  ?  ?  ?  ?  ?  ?  ?  ?   ?  ?Prior Function    ?  ?  ?   ? ?PT Goals (current goals can now be found in the care plan section) Acute Rehab PT Goals ?Patient Stated Goal: be left alone ?PT Goal Formulation: With patient ?Time For Goal Achievement: 05/06/21 ?Potential to Achieve Goals: Fair ?Progress towards PT goals: Progressing toward goals (slowly) ? ?  ?Frequency ? ? ? Min 3X/week ? ? ? ?  ?PT Plan Current plan remains appropriate  ? ? ?Co-evaluation   ?  ?  ?  ?  ? ?  ?AM-PAC PT "6 Clicks" Mobility   ?Outcome Measure ? Help needed turning from your back to your side while in a flat bed without using bedrails?: A Little ?Help needed moving from lying on your back to sitting on the side of a flat bed without using bedrails?: A Lot ?Help needed moving to and from a bed to a chair (including a wheelchair)?: Total ?Help needed standing up from a chair using your arms (e.g., wheelchair or bedside chair)?: Total ?Help needed to walk in hospital room?: Total ?Help needed climbing 3-5 steps with a railing? : Total ?6 Click Score: 9 ? ?  ?End of Session Equipment Utilized During Treatment: Oxygen ?Activity Tolerance: Patient limited by fatigue;Patient limited by pain ?Patient left: in bed;with call bell/phone within reach ?Nurse Communication: Mobility status ?PT Visit Diagnosis: Other abnormalities of gait and mobility (R26.89);Muscle weakness (generalized) (M62.81);Pain ?Pain - part of body:  (abdomen) ?  ? ? ?Time: 5997-7414 ?PT Time Calculation (min) (ACUTE ONLY): 25 min ? ?Charges:  $Therapeutic Exercise: 23-37 mins          ?          ? ?Jonne Ply, SPT ? ? ?Jonne Ply ?04/26/2021, 3:36 PM ? ?

## 2021-04-26 NOTE — TOC Progression Note (Addendum)
Transition of Care (TOC) - Progression Note  ? ? ?Patient Details  ?Name: [name] ?MRN: [id number] ?Date of Birth: 1946/01/10 ? ?Transition of Care (TOC) CM/SW Contact  ?Joanne Chars, LCSW ?Phone Number: ?04/26/2021, 11:36 AM ? ?Clinical Narrative:    ?New GI consult, pt not ready to leave today per MD. ? ?Per Tracy/Renal Navigator, [facility] could not start pt outpt HD until Tuesday 3/21:  pt will need to arrive at [facility] on Tuesday at 11:10 to complete paperwork prior to 12:00 chair time. After that first appt, pt will need to arrive at 6:40 for 7:00 chair time. Juliann Pulse at [facility] notified of this.  ? ?Ashley/[employer] asked for pulse oximetry results related to pt need for bipap, these were forwarded to him. He will be sending an order for the bipap.  ? ? ? ?Expected Discharge Plan: [place] ?Barriers to Discharge: Continued Medical Work up, SNF Pending bed offer ? ?Expected Discharge Plan and Services ?Expected Discharge Plan: [place] ?In-house Referral: Clinical Social Work ?  ?Post Acute Care Choice: [place] ?Living arrangements for the past 2 months: [place] ?                ?  ?  ?  ?  ?  ?  ?  ?  ?  ?  ? ? ?Social Determinants of Health (SDOH) Interventions ?  ? ?Readmission Risk Interventions ?Readmission Risk Prevention Plan 01/10/2021  ?Transportation Screening Complete  ?PCP or Specialist Appt within 3-5 Days Complete  ?[place] or Home Care Consult Complete  ?Social Work Consult for [place] Planning/Counseling Complete  ?Palliative Care Screening Not Applicable  ?Medication Review Press photographer) Complete  ?Some recent data might be hidden  ? ? ?

## 2021-04-26 NOTE — Progress Notes (Signed)
Pt refusing CPAP/BIPAP at this time. ?

## 2021-04-26 NOTE — Progress Notes (Signed)
Pt unable to start at [facility] until Tuesday (TTS schedule). Clinic changed pt's times and are as follows: Tuesday, March 21, pt needs to arrive at 11:10 to complete paperwork for 12:00 chair time. After first appt, pt will need to arrive at 6:40 for 7:00 chair time. Navigator provided this info to CSW to provide to snf. Navigator also spoke to pt's son, [name], via phone and pt at bedside to make aware of the above arrangements. Schedule letter provided to pt at bedside and a picture of letter sent to pt's son via text. Clinic will need orders at d/c. Renal NP made aware pt may d/c this weekend. Above arrangements placed on pt's AVS as well. Will assist as needed.  ? ?Melven Sartorius ?Renal Navigator ?[phone] ?

## 2021-04-26 NOTE — Plan of Care (Signed)
?  Problem: Education: ?Goal: Knowledge of General Education information will improve ?Description: Including pain rating scale, medication(s)/side effects and non-pharmacologic comfort measures ?Outcome: Progressing ?  ?Problem: Health Behavior/Discharge Planning: ?Goal: Ability to manage health-related needs will improve ?Outcome: Progressing ?  ?Problem: Clinical Measurements: ?Goal: Ability to maintain clinical measurements within normal limits will improve ?Outcome: Progressing ?Goal: Will remain free from infection ?Outcome: Progressing ?Goal: Diagnostic test results will improve ?Outcome: Progressing ?Goal: Respiratory complications will improve ?Outcome: Progressing ?Goal: Cardiovascular complication will be avoided ?Outcome: Progressing ?  ?Problem: Activity: ?Goal: Risk for activity intolerance will decrease ?Outcome: Progressing ?  ?Problem: Coping: ?Goal: Level of anxiety will decrease ?Outcome: Progressing ?  ?Problem: Elimination: ?Goal: Will not experience complications related to bowel motility ?Outcome: Progressing ?Goal: Will not experience complications related to urinary retention ?Outcome: Progressing ?  ?Problem: Pain Managment: ?Goal: General experience of comfort will improve ?Outcome: Progressing ?  ?Problem: Safety: ?Goal: Ability to remain free from injury will improve ?Outcome: Progressing ?  ?Problem: Skin Integrity: ?Goal: Risk for impaired skin integrity will decrease ?Outcome: Progressing ?  ?Problem: Education: ?Goal: Knowledge of disease and its progression will improve ?Outcome: Progressing ?Goal: Individualized Educational Video(s) ?Outcome: Progressing ?  ?Problem: Fluid Volume: ?Goal: Compliance with measures to maintain balanced fluid volume will improve ?Outcome: Progressing ?  ?Problem: Health Behavior/Discharge Planning: ?Goal: Ability to manage health-related needs will improve ?Outcome: Progressing ?  ?Problem: Nutritional: ?Goal: Ability to make healthy dietary choices  will improve ?Outcome: Progressing ?  ?Problem: Clinical Measurements: ?Goal: Complications related to the disease process, condition or treatment will be avoided or minimized ?Outcome: Progressing ?  ?Problem: Education: ?Goal: Knowledge of General Education information will improve ?Description: Including pain rating scale, medication(s)/side effects and non-pharmacologic comfort measures ?Outcome: Progressing ?  ?Problem: Health Behavior/Discharge Planning: ?Goal: Ability to manage health-related needs will improve ?Outcome: Progressing ?  ?Problem: Clinical Measurements: ?Goal: Ability to maintain clinical measurements within normal limits will improve ?Outcome: Progressing ?Goal: Will remain free from infection ?Outcome: Progressing ?Goal: Diagnostic test results will improve ?Outcome: Progressing ?Goal: Respiratory complications will improve ?Outcome: Progressing ?Goal: Cardiovascular complication will be avoided ?Outcome: Progressing ?  ?Problem: Activity: ?Goal: Risk for activity intolerance will decrease ?Outcome: Progressing ?  ?Problem: Nutrition: ?Goal: Adequate nutrition will be maintained ?Outcome: Progressing ?  ?Problem: Coping: ?Goal: Level of anxiety will decrease ?Outcome: Progressing ?  ?Problem: Elimination: ?Goal: Will not experience complications related to bowel motility ?Outcome: Progressing ?Goal: Will not experience complications related to urinary retention ?Outcome: Progressing ?  ?Problem: Pain Managment: ?Goal: General experience of comfort will improve ?Outcome: Progressing ?  ?Problem: Safety: ?Goal: Ability to remain free from injury will improve ?Outcome: Progressing ?  ?Problem: Skin Integrity: ?Goal: Risk for impaired skin integrity will decrease ?Outcome: Progressing ?  ?

## 2021-04-26 NOTE — Progress Notes (Signed)
RT NOTE: ? ?Overnight pulse oximetry downloaded. Report printed and sent to 6N for patients chart.  ?

## 2021-04-27 DIAGNOSIS — S72002A Fracture of unspecified part of neck of left femur, initial encounter for closed fracture: Secondary | ICD-10-CM | POA: Diagnosis not present

## 2021-04-27 DIAGNOSIS — M7989 Other specified soft tissue disorders: Secondary | ICD-10-CM

## 2021-04-27 DIAGNOSIS — K581 Irritable bowel syndrome with constipation: Secondary | ICD-10-CM

## 2021-04-27 LAB — MAGNESIUM: Magnesium: 2.3 mg/dL (ref 1.7–2.4)

## 2021-04-27 LAB — GLUCOSE, CAPILLARY
Glucose-Capillary: 89 mg/dL (ref 70–99)
Glucose-Capillary: 130 mg/dL — ABNORMAL HIGH (ref 70–99)
Glucose-Capillary: 116 mg/dL — ABNORMAL HIGH (ref 70–99)
Glucose-Capillary: 134 mg/dL — ABNORMAL HIGH (ref 70–99)

## 2021-04-27 LAB — PARATHYROID HORMONE, INTACT (NO CA): PTH: 50 pg/mL (ref 15–65)

## 2021-04-27 MED ORDER — LINACLOTIDE 145 MCG PO CAPS
145.0000 ug | ORAL_CAPSULE | Freq: Every day | ORAL | Status: DC
  Administered 2021-04-27 – 2021-04-28 (×2): 145 ug via ORAL
  Filled 2021-04-27 (×2): qty 1

## 2021-04-27 MED ORDER — HEPARIN SODIUM (PORCINE) 1000 UNIT/ML IJ SOLN
INTRAMUSCULAR | Status: AC
Start: 2021-04-27 — End: 2021-04-28
  Filled 2021-04-27: qty 4

## 2021-04-27 MED ORDER — HYDROCODONE-ACETAMINOPHEN 5-325 MG PO TABS
ORAL_TABLET | ORAL | Status: AC
Start: 2021-04-27 — End: 2021-04-28
  Filled 2021-04-27: qty 2

## 2021-04-27 NOTE — Progress Notes (Signed)
?PROGRESS NOTE ? ?[name]:[id number] DOB: 09/15/1945 DOA: 04/03/2021 ?PCP: Lujean Amel, MD ? ? LOS: 24 days  ? ?Brief Narrative / Interim history: ?The patient is 76 year old obese female with known OSA OHS O2 dependent at 3 L nasal cannula who fell and suffered a fracture left femur, treated with ORIF.  She developed worsening renal failure while in the hospital required a HD catheter that was tunneled and placed on 04/11/2021 per interventional radiology.  On 04/12/2021 she had worsening respiratory failure she was obtunded and was transferred to intensive care unit for further evaluation and treatment by pulmonary critical care and started on CRRT.  He was transferred back to the hospitalist service and has remained stable, awaiting placement/outpatient HD ? ?Subjective / 24h Interval events: ?Continues to have abdominal pain this morning, worse than her normal.  She is surprised about her pain today because she has not had anything to eat ? ?Assesement and Plan: ?Principal Problem: ?  Closed left hip fracture ([place]) secondary to fall at home ?Active Problems: ?  Back pain ?  Thrombocytopenia ([place]) ?  ANEMIA ?  HTN (hypertension), benign ?  Mixed hypercholesterolemia and hypertriglyceridemia ?  Abdominal pain ?  S/P CABG x 5 ?  Chronic diastolic CHF (congestive heart failure) ([place]) ?  CHB (complete heart block) (HCC) ?  Diabetes mellitus without complication ([place]) ?  Respiratory failure ([place]) ?  Closed fracture of left superior pubic ramus (HCC) ?  Chronic kidney disease, stage III B (moderate) (HCC) ?  Fall at home, initial encounter ?  Hypokalemia ?  Pressure injury of skin ? ?Assessment and Plan: ?Principal problem ?Closed left hip fracture ([place]) secondary to fall at home -Patient presents after having a fall found to have a left hip and left superior pubic ramus fracture.  Orthopedic surgery consulted, she is status post ORIF.  Needs SNF, placement pending ? ?Active problems ?Abdominal pain -CT scan  without acute findings.  Somewhat acute on chronic as she saw GI in the past for this.  She tells me that the pain is much worse and especially after she eats.  Discussed with renal, did could not get CT angiogram but obtained a mesenteric ultrasound which did not show any significant obstructions.  GI consulted yesterday, appreciate input.  Discussed with Dr. Silverio Decamp, underwent MiraLAX cleanout and seems to be better this morning ? ?Acute kidney injury on chronic kidney disease, stage III B, now dialysis dependent-nephrology following, currently on dialysis, she will need to be set up with outpatient dialysis.  Per nephrology, there is some chance of recovery ? ?Back pain -Complaining of back Pain but has not been OOB much.  Thoracic spine x-ray without acute findings but diffuse intervertebral ankylosis with generalized osteopenia.  Conservative management, pain control ? ?Thrombocytopenia ([place]) -resolved ? ?Hypokalemia -continue to monitor and replete as indicated ? ?Closed fracture of left superior pubic ramus (HCC) -Secondary to fall.  No surgical intervention recommended for treatment of this fracture. ? ?Acute on chronic hypoxic hypoxic hypercapnic respiratory failure -Patient has history of obesity hypoventilation syndrome and OSA.  She is chronically on 3 L of oxygen at baseline.  Respiratory status remains at baseline ? ?Diabetes mellitus without complication (HCC) -Last hemoglobin A1c was 6.5 less than 3 months ago.  Patient is on Jardiance 10 mg daily.  CBGs with good control ? ?CBG (last 3)  ?Recent Labs  ?  04/26/21 ?1127 04/26/21 ?1640 04/27/21 ?0818  ?GLUCAP 134* 164* 89  ? ? ?CHB (complete  heart block) (HCC) -S/p PPM/ICD  ? ?Chronic diastolic CHF (congestive heart failure) (HCC) -Chronic.  Fluid management per HD, dialysis today ? ?S/P CABG x 5 -Last cardiac catheterization was in 02/2019 where all territories were noted to have adequate coronary flow via bypasses or native vessels with stents.  Continue aspirin, Plavix, and Crestor ? ?Mixed hypercholesterolemia and hypertriglyceridemia -Continue Crestor ? ?HTN (hypertension), benign -continue regimen as below, blood pressure remained stable ? ?Anemia in the setting of renal disease-CBC stable ? ?Scheduled Meds: ? allopurinol  100 mg Oral Daily  ? aspirin  81 mg Oral Daily  ? calcium acetate  667 mg Oral TID WC  ? Chlorhexidine Gluconate Cloth  6 each Topical Q0600  ? clopidogrel  75 mg Oral Daily  ? cycloSPORINE  1 drop Both Eyes BID  ? darbepoetin (ARANESP) injection - DIALYSIS  100 mcg Intravenous Q Tue-HD  ? heparin injection (subcutaneous)  5,000 Units Subcutaneous Q8H  ? hydrocortisone cream   Topical BID  ? insulin aspart  0-6 Units Subcutaneous TID WC  ? lidocaine  1 patch Transdermal Q24H  ? mouth rinse  15 mL Mouth Rinse q12n4p  ? metoCLOPramide  5 mg Oral BID AC  ? midodrine  10 mg Oral 3 times per day on Tue Thu Sat  ? mometasone-formoterol  2 puff Inhalation BID  ? pantoprazole  40 mg Oral BID  ? polyethylene glycol  17 g Oral BID  ? polyvinyl alcohol  1 drop Both Eyes Q12H  ? predniSONE  10 mg Oral Q breakfast  ? rosuvastatin  5 mg Oral QPM  ? senna-docusate  2 tablet Oral BID  ? simethicone  80 mg Oral QID  ? sodium chloride flush  10-40 mL Intracatheter Q12H  ? sucralfate  1 g Oral TID WC & HS  ? ?Continuous Infusions: ? sodium chloride    ? ?PRN Meds:.acetaminophen, albuterol, alum & mag hydroxide-simeth, guaiFENesin, hydrALAZINE, HYDROcodone-acetaminophen, melatonin, ondansetron (ZOFRAN) IV, sodium chloride, sodium chloride flush ? ?Diet Orders (From admission, onward)  ? ?  Start     Ordered  ? 04/26/21 1019  Diet renal/carb modified with fluid restriction Diet-HS Snack? Nothing; Fluid restriction: 1200 mL Fluid; Room service appropriate? Yes; Fluid consistency: Thin  Diet effective now       ?Comments: Soft diet  ?Question Answer Comment  ?Diet-HS Snack? Nothing   ?Fluid restriction: 1200 mL Fluid   ?Room service appropriate? Yes    ?Fluid consistency: Thin   ?  ? 04/26/21 1019  ? ?  ?  ? ?  ? ? ?DVT prophylaxis: heparin injection 5,000 Units Start: 04/15/21 1400 ? ? ?Lab Results  ?Component Value Date  ? PLT 164 04/26/2021  ? ? ?  Code Status: Full Code ? ?Family Communication: no family at bedside  ? ?Status is: Inpatient ? ?Remains inpatient appropriate because: waiting for placement ? ?Level of care: Progressive ? ?Consultants:  ?Nephrology  ? ?Procedures:  ?none ? ?Microbiology  ?none ? ?Antimicrobials: ?none  ? ? ?Objective: ?Vitals:  ? 04/26/21 2101 04/26/21 2349 04/27/21 0333 04/27/21 9381  ?BP: 133/67 131/60 120/63 (!) 132/54  ?Pulse: 84 77 77 77  ?Resp: '18 18 20 18  '$ ?Temp: 98 ?F (36.7 ?C)   97.8 ?F (36.6 ?C)  ?TempSrc: Oral   Axillary  ?SpO2: 94% 93% 96% 96%  ?Weight:      ?Height:      ? ? ?Intake/Output Summary (Last 24 hours) at 04/27/2021 1029 ?Last data filed at 04/26/2021 2104 ?  Gross per 24 hour  ?Intake 480 ml  ?Output --  ?Net 480 ml  ? ? ?Wt Readings from Last 3 Encounters:  ?04/25/21 97.2 kg  ?04/01/21 103 kg  ?03/18/21 103.6 kg  ? ? ?Examination: ? ?Constitutional: NAD ?Eyes: lids and conjunctivae normal, no scleral icterus ?ENMT: mmm ?Neck: normal, supple ?Respiratory: clear to auscultation bilaterally, no wheezing, no crackles. Normal respiratory effort.  ?Cardiovascular: Regular rate and rhythm, no murmurs / rubs / gallops. No LE edema. ?Abdomen: soft, no distention, no tenderness. Bowel sounds positive.  ?Skin: no rashes ?Neurologic: no focal deficits, equal strength ? ?Data Reviewed: I have independently reviewed following labs and imaging studies  ? ?CBC ?Recent Labs  ?Lab 04/23/21 ?0246 04/24/21 ?0215 04/26/21 ?3888  ?WBC 9.1 8.4 3.5*  ?HGB 8.1* 8.5* 8.0*  ?HCT 25.8* 27.8* 26.1*  ?PLT 138* 146* 164  ?MCV 96.6 98.6 99.2  ?MCH 30.3 30.1 30.4  ?MCHC 31.4 30.6 30.7  ?RDW 17.6* 18.0* 18.7*  ?LYMPHSABS 0.7 0.8  --   ?MONOABS 0.5 0.6  --   ?EOSABS 0.0 0.0  --   ?BASOSABS 0.0 0.0  --   ? ? ? ?Recent Labs  ?Lab 04/22/21 ?2800  04/23/21 ?0246 04/24/21 ?0215 04/25/21 ?3491 04/25/21 ?7915 04/26/21 ?0569 04/27/21 ?7948  ?NA 130* 126* 133* 132*  --  133*  133*  --   ?K 4.5 5.4* 4.3 4.3  --  4.4  4.4  --   ?CL 93* 89* 95* 96*  --  97*  97*

## 2021-04-27 NOTE — Progress Notes (Signed)
[facility] ROUNDING NOTE ? ? ?Subjective: ? ?Abdominal pain is improving.  She had multiple bowel movements yesterday with MiraLAX bowel purge ?She is tolerating diet ? ?Objective: ?Vital signs in last 24 hours: ?Temp:  [97.8 ?F (36.6 ?C)-98.6 ?F (37 ?C)] 97.8 ?F (36.6 ?C) (03/18 1610) ?Pulse Rate:  [77-84] 77 (03/18 0821) ?Resp:  [18-23] 18 (03/18 9604) ?BP: (120-144)/(54-67) 132/54 (03/18 5409) ?SpO2:  [93 %-100 %] 96 % (03/18 0821) ?Last BM Date : 04/26/21 ?General: NAD ? ? ? ?Intake/Output from previous day: ?03/17 0701 - 03/18 0700 ?In: 480 [P.O.:480] ?Out: -  ?Intake/Output this shift: ?No intake/output data recorded. ? ? ?Lab Results: ?Recent Labs  ?  04/26/21 ?0233  ?WBC 3.5*  ?HGB 8.0*  ?PLT 164  ?MCV 99.2  ? ?BMET ?Recent Labs  ?  04/25/21 ?0256 04/25/21 ?0626 04/26/21 ?0233  ?NA 132*  --  133*  133*  ?K 4.3  --  4.4  4.4  ?CL 96*  --  97*  97*  ?CO2 24  --  27  27  ?GLUCOSE 106*  --  109*  108*  ?BUN 53*  --  31*  31*  ?CREATININE 4.27*  --  3.14*  3.15*  ?CALCIUM 9.3 9.3 9.2  9.2  ? ?LFT ?Recent Labs  ?  04/25/21 ?0256 04/26/21 ?0233  ?PROT  --  5.8*  ?ALBUMIN 2.4* 2.3*  2.3*  ?AST  --  20  ?ALT  --  19  ?ALKPHOS  --  112  ?BILITOT  --  0.5  ? ?PT/INR ?No results for input(s): INR in the last 72 hours. ? ? ? ?Imaging/Other results: ?VAS [place] MESENTERIC ? ?Result Date: 04/26/2021 ?ABDOMINAL VISCERAL Patient Name:  MARIABELLA NILSEN  Date of Exam:   04/26/2021 Medical Rec #: [id number]          Accession #:    [id number] Date of Birth: [birth year]           Patient Gender: F Patient Age:   76 years Exam [facility]:  [facility] Procedure:      VAS [place] MESENTERIC Referring Phys: [place] -------------------------------------------------------------------------------- Indications: Abdomen pain              Insufficiency mesenteric High Risk Factors: Hypertension, Diabetes, coronary artery disease. Other Factors: CABG x5                Renal failure. Limitations: Obesity and  air/bowel gas. Comparison Study: No prior. Performing Technologist: Oda Cogan RDMS, RVT  Examination Guidelines: A complete evaluation includes B-mode imaging, spectral Doppler, color Doppler, and power Doppler as needed of all accessible portions of each vessel. Bilateral testing is considered an integral part of a complete examination. Limited examinations for reoccurring indications may be performed as noted.  Duplex Findings: +----------------------+--------+--------+------+--------------+ Mesenteric            PSV cm/sEDV cm/sPlaque   Comments    +----------------------+--------+--------+------+--------------+ Aorta Prox               76                                +----------------------+--------+--------+------+--------------+ Celiac Artery Origin    139                                +----------------------+--------+--------+------+--------------+ Celiac Artery Proximal  130                                +----------------------+--------+--------+------+--------------+  SMA Origin              277                                +----------------------+--------+--------+------+--------------+ SMA Proximal            252                                +----------------------+--------+--------+------+--------------+ SMA Mid                 188                                +----------------------+--------+--------+------+--------------+ SMA Distal              191                                +----------------------+--------+--------+------+--------------+ IMA                                         Not visualized +----------------------+--------+--------+------+--------------+    Summary: Mesenteric: Normal Celiac artery findings. Mildly elevated velocities in the proximal SMA suggestive of less than 70% stenosis.  *See table(s) above for measurements and observations.     Preliminary    ? ? ? ?Assessment &Plan ? ?76 year old very pleasant female  with morbid obesity, chronic irritable bowel syndrome, end-stage renal disease on hemodialysis s/p ORIF for left hip fracture with worsening abdominal pain improved after bowel purge ? ?We will start Linzess 145 mcg daily to prevent recurrent constipation ?Continue with diet as tolerated ? ?Use lidocaine patches in the epigastric area for possible subcutaneous fat necrosis ? ?We will arrange for outpatient GI if follow-up on discharge ? ?We will sign off but GI is available if needed, please call with any questions ? ? ? ?K. Denzil Magnuson , MD ?[phone]  ?[facility] ? ? ?

## 2021-04-27 NOTE — Progress Notes (Signed)
?[facility] ?Progress Note  ? ?Subjective: Abdominal pain slightly better today.  She underwent cleanout with MiraLAX and was having several bowel movements.  Plan for dialysis today ? ? ?Objective ?Vitals:  ? 04/26/21 2101 04/26/21 2349 04/27/21 0333 04/27/21 2703  ?BP: 133/67 131/60 120/63 (!) 132/54  ?Pulse: 84 77 77 77  ?Resp: '18 18 20 18  '$ ?Temp: 98 ?F (36.7 ?C)   97.8 ?F (36.6 ?C)  ?TempSrc: Oral   Axillary  ?SpO2: 94% 93% 96% 96%  ?Weight:      ?Height:      ? ?Physical Exam ?General: obese woman lying in bed, no distress ?Heart: Normal rate ?Lungs: Bilateral chest rise with no increased work of breathing ?Abdomen: soft, obese ?Extremities: 1+ bilateral lower extremity edema, warm and well perfused ?Dialysis Access:  [facility] c/d/I some bruising but no erythema ? ?Additional Objective ?Labs: ?Basic Metabolic Panel: ?Recent Labs  ?Lab 04/24/21 ?0215 04/25/21 ?0256 04/25/21 ?5009 04/26/21 ?3818  ?NA 133* 132*  --  133*  133*  ?K 4.3 4.3  --  4.4  4.4  ?CL 95* 96*  --  97*  97*  ?CO2 24 24  --  27  27  ?GLUCOSE 100* 106*  --  109*  108*  ?BUN 36* 53*  --  31*  31*  ?CREATININE 2.88* 4.27*  --  3.14*  3.15*  ?CALCIUM 9.2 9.3 9.3 9.2  9.2  ?PHOS 4.1 4.0  --  4.3  ? ?Liver Function Tests: ?Recent Labs  ?Lab 04/23/21 ?0246 04/24/21 ?0215 04/25/21 ?0256 04/26/21 ?2993  ?AST 13* 18  --  20  ?ALT 18 16  --  19  ?ALKPHOS 135* 117  --  112  ?BILITOT 0.4 0.8  --  0.5  ?PROT 6.2* 6.2*  --  5.8*  ?ALBUMIN 2.3* 2.3* 2.4* 2.3*  2.3*  ? ?No results for input(s): LIPASE, AMYLASE in the last 168 hours. ?CBC: ?Recent Labs  ?Lab 04/23/21 ?0246 04/24/21 ?0215 04/26/21 ?7169  ?WBC 9.1 8.4 3.5*  ?NEUTROABS 7.8* 6.9  --   ?HGB 8.1* 8.5* 8.0*  ?HCT 25.8* 27.8* 26.1*  ?MCV 96.6 98.6 99.2  ?PLT 138* 146* 164  ? ?Blood Culture ?   ?Component Value Date/Time  ? SDES IN/OUT CATH URINE 04/22/2021 1522  ? SPECREQUEST  04/22/2021 1522  ?  NONE ?Performed at [facility], [place] [address] [place], [place]  [address] ?  ? CULT >=100,000 COLONIES/mL PROTEUS MIRABILIS (A) 04/22/2021 1522  ? REPTSTATUS 04/24/2021 FINAL 04/22/2021 1522  ? ? ?Cardiac Enzymes: ?No results for input(s): CKTOTAL, CKMB, CKMBINDEX, TROPONINI in the last 168 hours. ?CBG: ?Recent Labs  ?Lab 04/25/21 ?1949 04/26/21 ?0736 04/26/21 ?1127 04/26/21 ?1640 04/27/21 ?0818  ?GLUCAP 134* 89 134* 164* 89  ? ?Iron Studies:  ?No results for input(s): IRON, TIBC, TRANSFERRIN, FERRITIN in the last 72 hours. ? ?'@lablastinr3'$ @ ?Studies/Results: ?VAS [place] MESENTERIC ? ?Result Date: 04/26/2021 ?ABDOMINAL VISCERAL Patient Name:  [name]  Date of Exam:   04/26/2021 Medical Rec #: [id number]          Accession #:    [id number] Date of Birth: May 08, 1945           Patient Gender: F Patient Age:   76 years Exam Location:  [facility] Procedure:      VAS [place] MESENTERIC Referring Phys: [place] -------------------------------------------------------------------------------- Indications: Abdomen pain              Insufficiency mesenteric High  Risk Factors: Hypertension, Diabetes, coronary artery disease. Other Factors: CABG x5                Renal failure. Limitations: Obesity and air/bowel gas. Comparison Study: No prior. Performing Technologist: Oda Cogan RDMS, RVT  Examination Guidelines: A complete evaluation includes B-mode imaging, spectral Doppler, color Doppler, and power Doppler as needed of all accessible portions of each vessel. Bilateral testing is considered an integral part of a complete examination. Limited examinations for reoccurring indications may be performed as noted.  Duplex Findings: +----------------------+--------+--------+------+--------------+ Mesenteric            PSV cm/sEDV cm/sPlaque   Comments    +----------------------+--------+--------+------+--------------+ Aorta Prox               76                                +----------------------+--------+--------+------+--------------+ Celiac Artery Origin     139                                +----------------------+--------+--------+------+--------------+ Celiac Artery Proximal  130                                +----------------------+--------+--------+------+--------------+ SMA Origin              277                                +----------------------+--------+--------+------+--------------+ SMA Proximal            252                                +----------------------+--------+--------+------+--------------+ SMA Mid                 188                                +----------------------+--------+--------+------+--------------+ SMA Distal              191                                +----------------------+--------+--------+------+--------------+ IMA                                         Not visualized +----------------------+--------+--------+------+--------------+    Summary: Mesenteric: Normal Celiac artery findings. Mildly elevated velocities in the proximal SMA suggestive of less than 70% stenosis.  *See table(s) above for measurements and observations.     Preliminary    ?Medications: ? sodium chloride    ? ? allopurinol  100 mg Oral Daily  ? aspirin  81 mg Oral Daily  ? calcium acetate  667 mg Oral TID WC  ? Chlorhexidine Gluconate Cloth  6 each Topical Q0600  ? clopidogrel  75 mg Oral Daily  ? cycloSPORINE  1 drop Both Eyes BID  ? darbepoetin (ARANESP) injection - DIALYSIS  100 mcg Intravenous Q Tue-HD  ? heparin injection (subcutaneous)  5,000 Units Subcutaneous Q8H  ?  hydrocortisone cream   Topical BID  ? insulin aspart  0-6 Units Subcutaneous TID WC  ? lidocaine  1 patch Transdermal Q24H  ? mouth rinse  15 mL Mouth Rinse q12n4p  ? metoCLOPramide  5 mg Oral BID AC  ? midodrine  10 mg Oral 3 times per day on Tue Thu Sat  ? mometasone-formoterol  2 puff Inhalation BID  ? pantoprazole  40 mg Oral BID  ? polyethylene glycol  17 g Oral BID  ? polyvinyl alcohol  1 drop Both Eyes Q12H  ? predniSONE  10 mg  Oral Q breakfast  ? rosuvastatin  5 mg Oral QPM  ? senna-docusate  2 tablet Oral BID  ? simethicone  80 mg Oral QID  ? sodium chloride flush  10-40 mL Intracatheter Q12H  ? sucralfate  1 g Oral TID WC & HS  ? ? ?Assessment/Plan: ?**AKI on CKD3:  baseline Cr in the 1.4-1.8 range now with anuric severe AKI following THR.  Renal US 2/25 CKD w/o obstruction. Thought to be ATN in the setting of post op hypotension.  CRRT 3/2 then converted to HD and continues to require ongoing HD.  On a TTS schedule at this time.  Now on midodrine, continue.  Daily labs eval for recovery; urine output seems fairly minimal.  Continue dialysis per schedule.  Has been accepted for outpatient unit. ? ?**Anemia:  Hb in 8-9s, sat 12% so IV iron and ESA ordered.   ? ?**hyponatremia:  mild, hypervolemic + impaired free water excretion with oliguric AKI, stable/improving with HD.  ? ?**Hyperkalemia: Resolved with dialysis ? ?**BMM:  ca and phos ok - on  binder.  PTH 36 ? ?**S/p hip fracture - operative repair 2/22 ? ?[facility] TTS 6am arranged.  Home vs SNF pending, hopefully [place] plans will still work out as it seems dispo plan changed.    ? ? ? ? ?

## 2021-04-28 DIAGNOSIS — J9612 Chronic respiratory failure with hypercapnia: Secondary | ICD-10-CM | POA: Diagnosis not present

## 2021-04-28 DIAGNOSIS — I517 Cardiomegaly: Secondary | ICD-10-CM | POA: Diagnosis not present

## 2021-04-28 DIAGNOSIS — M25552 Pain in left hip: Secondary | ICD-10-CM | POA: Diagnosis not present

## 2021-04-28 DIAGNOSIS — I10 Essential (primary) hypertension: Secondary | ICD-10-CM | POA: Diagnosis not present

## 2021-04-28 DIAGNOSIS — Z992 Dependence on renal dialysis: Secondary | ICD-10-CM | POA: Diagnosis not present

## 2021-04-28 DIAGNOSIS — Z743 Need for continuous supervision: Secondary | ICD-10-CM | POA: Diagnosis not present

## 2021-04-28 DIAGNOSIS — L89150 Pressure ulcer of sacral region, unstageable: Secondary | ICD-10-CM | POA: Diagnosis not present

## 2021-04-28 DIAGNOSIS — Z7689 Persons encountering health services in other specified circumstances: Secondary | ICD-10-CM | POA: Diagnosis not present

## 2021-04-28 DIAGNOSIS — M6281 Muscle weakness (generalized): Secondary | ICD-10-CM | POA: Diagnosis not present

## 2021-04-28 DIAGNOSIS — R0689 Other abnormalities of breathing: Secondary | ICD-10-CM | POA: Diagnosis not present

## 2021-04-28 DIAGNOSIS — Z950 Presence of cardiac pacemaker: Secondary | ICD-10-CM | POA: Diagnosis not present

## 2021-04-28 DIAGNOSIS — R6889 Other general symptoms and signs: Secondary | ICD-10-CM | POA: Diagnosis not present

## 2021-04-28 DIAGNOSIS — I429 Cardiomyopathy, unspecified: Secondary | ICD-10-CM | POA: Diagnosis not present

## 2021-04-28 DIAGNOSIS — G8929 Other chronic pain: Secondary | ICD-10-CM | POA: Diagnosis not present

## 2021-04-28 DIAGNOSIS — Z23 Encounter for immunization: Secondary | ICD-10-CM | POA: Diagnosis not present

## 2021-04-28 DIAGNOSIS — N178 Other acute kidney failure: Secondary | ICD-10-CM | POA: Diagnosis not present

## 2021-04-28 DIAGNOSIS — J9622 Acute and chronic respiratory failure with hypercapnia: Secondary | ICD-10-CM | POA: Diagnosis not present

## 2021-04-28 DIAGNOSIS — D631 Anemia in chronic kidney disease: Secondary | ICD-10-CM | POA: Diagnosis not present

## 2021-04-28 DIAGNOSIS — J810 Acute pulmonary edema: Secondary | ICD-10-CM | POA: Diagnosis not present

## 2021-04-28 DIAGNOSIS — R1010 Upper abdominal pain, unspecified: Secondary | ICD-10-CM | POA: Diagnosis not present

## 2021-04-28 DIAGNOSIS — N1832 Chronic kidney disease, stage 3b: Secondary | ICD-10-CM | POA: Diagnosis not present

## 2021-04-28 DIAGNOSIS — M19072 Primary osteoarthritis, left ankle and foot: Secondary | ICD-10-CM | POA: Diagnosis not present

## 2021-04-28 DIAGNOSIS — J9621 Acute and chronic respiratory failure with hypoxia: Secondary | ICD-10-CM | POA: Diagnosis not present

## 2021-04-28 DIAGNOSIS — S72142D Displaced intertrochanteric fracture of left femur, subsequent encounter for closed fracture with routine healing: Secondary | ICD-10-CM | POA: Diagnosis not present

## 2021-04-28 DIAGNOSIS — S32512D Fracture of superior rim of left pubis, subsequent encounter for fracture with routine healing: Secondary | ICD-10-CM | POA: Diagnosis not present

## 2021-04-28 DIAGNOSIS — D696 Thrombocytopenia, unspecified: Secondary | ICD-10-CM | POA: Diagnosis not present

## 2021-04-28 DIAGNOSIS — K5900 Constipation, unspecified: Secondary | ICD-10-CM | POA: Diagnosis not present

## 2021-04-28 DIAGNOSIS — L89153 Pressure ulcer of sacral region, stage 3: Secondary | ICD-10-CM | POA: Diagnosis not present

## 2021-04-28 DIAGNOSIS — R531 Weakness: Secondary | ICD-10-CM | POA: Diagnosis not present

## 2021-04-28 DIAGNOSIS — T8249XA Other complication of vascular dialysis catheter, initial encounter: Secondary | ICD-10-CM | POA: Diagnosis not present

## 2021-04-28 DIAGNOSIS — Z8673 Personal history of transient ischemic attack (TIA), and cerebral infarction without residual deficits: Secondary | ICD-10-CM | POA: Diagnosis not present

## 2021-04-28 DIAGNOSIS — I959 Hypotension, unspecified: Secondary | ICD-10-CM | POA: Diagnosis not present

## 2021-04-28 DIAGNOSIS — N179 Acute kidney failure, unspecified: Secondary | ICD-10-CM | POA: Diagnosis not present

## 2021-04-28 DIAGNOSIS — M545 Low back pain, unspecified: Secondary | ICD-10-CM | POA: Diagnosis not present

## 2021-04-28 DIAGNOSIS — R262 Difficulty in walking, not elsewhere classified: Secondary | ICD-10-CM | POA: Diagnosis not present

## 2021-04-28 DIAGNOSIS — N186 End stage renal disease: Secondary | ICD-10-CM | POA: Diagnosis not present

## 2021-04-28 DIAGNOSIS — J9811 Atelectasis: Secondary | ICD-10-CM | POA: Diagnosis not present

## 2021-04-28 DIAGNOSIS — I5022 Chronic systolic (congestive) heart failure: Secondary | ICD-10-CM | POA: Diagnosis not present

## 2021-04-28 DIAGNOSIS — N183 Chronic kidney disease, stage 3 unspecified: Secondary | ICD-10-CM | POA: Diagnosis not present

## 2021-04-28 DIAGNOSIS — E871 Hypo-osmolality and hyponatremia: Secondary | ICD-10-CM | POA: Diagnosis not present

## 2021-04-28 DIAGNOSIS — H04123 Dry eye syndrome of bilateral lacrimal glands: Secondary | ICD-10-CM | POA: Diagnosis not present

## 2021-04-28 DIAGNOSIS — Z1159 Encounter for screening for other viral diseases: Secondary | ICD-10-CM | POA: Diagnosis not present

## 2021-04-28 DIAGNOSIS — L299 Pruritus, unspecified: Secondary | ICD-10-CM | POA: Diagnosis not present

## 2021-04-28 DIAGNOSIS — Z9889 Other specified postprocedural states: Secondary | ICD-10-CM | POA: Diagnosis not present

## 2021-04-28 DIAGNOSIS — Z7982 Long term (current) use of aspirin: Secondary | ICD-10-CM | POA: Diagnosis not present

## 2021-04-28 DIAGNOSIS — R52 Pain, unspecified: Secondary | ICD-10-CM | POA: Diagnosis not present

## 2021-04-28 DIAGNOSIS — J9611 Chronic respiratory failure with hypoxia: Secondary | ICD-10-CM | POA: Diagnosis not present

## 2021-04-28 DIAGNOSIS — M7989 Other specified soft tissue disorders: Secondary | ICD-10-CM | POA: Diagnosis not present

## 2021-04-28 DIAGNOSIS — E876 Hypokalemia: Secondary | ICD-10-CM | POA: Diagnosis not present

## 2021-04-28 DIAGNOSIS — Z7401 Bed confinement status: Secondary | ICD-10-CM | POA: Diagnosis not present

## 2021-04-28 DIAGNOSIS — I9589 Other hypotension: Secondary | ICD-10-CM | POA: Diagnosis not present

## 2021-04-28 DIAGNOSIS — K649 Unspecified hemorrhoids: Secondary | ICD-10-CM | POA: Diagnosis not present

## 2021-04-28 DIAGNOSIS — S72002D Fracture of unspecified part of neck of left femur, subsequent encounter for closed fracture with routine healing: Secondary | ICD-10-CM

## 2021-04-28 DIAGNOSIS — K581 Irritable bowel syndrome with constipation: Secondary | ICD-10-CM | POA: Diagnosis not present

## 2021-04-28 DIAGNOSIS — E860 Dehydration: Secondary | ICD-10-CM | POA: Diagnosis not present

## 2021-04-28 DIAGNOSIS — I2510 Atherosclerotic heart disease of native coronary artery without angina pectoris: Secondary | ICD-10-CM | POA: Diagnosis not present

## 2021-04-28 DIAGNOSIS — E2740 Unspecified adrenocortical insufficiency: Secondary | ICD-10-CM | POA: Diagnosis not present

## 2021-04-28 DIAGNOSIS — I120 Hypertensive chronic kidney disease with stage 5 chronic kidney disease or end stage renal disease: Secondary | ICD-10-CM | POA: Diagnosis not present

## 2021-04-28 DIAGNOSIS — I442 Atrioventricular block, complete: Secondary | ICD-10-CM | POA: Diagnosis not present

## 2021-04-28 DIAGNOSIS — E1129 Type 2 diabetes mellitus with other diabetic kidney complication: Secondary | ICD-10-CM | POA: Diagnosis not present

## 2021-04-28 DIAGNOSIS — K219 Gastro-esophageal reflux disease without esophagitis: Secondary | ICD-10-CM | POA: Diagnosis not present

## 2021-04-28 DIAGNOSIS — J811 Chronic pulmonary edema: Secondary | ICD-10-CM | POA: Diagnosis not present

## 2021-04-28 DIAGNOSIS — I25118 Atherosclerotic heart disease of native coronary artery with other forms of angina pectoris: Secondary | ICD-10-CM | POA: Diagnosis not present

## 2021-04-28 DIAGNOSIS — E1121 Type 2 diabetes mellitus with diabetic nephropathy: Secondary | ICD-10-CM | POA: Diagnosis not present

## 2021-04-28 DIAGNOSIS — Z951 Presence of aortocoronary bypass graft: Secondary | ICD-10-CM | POA: Diagnosis not present

## 2021-04-28 DIAGNOSIS — J849 Interstitial pulmonary disease, unspecified: Secondary | ICD-10-CM | POA: Diagnosis not present

## 2021-04-28 DIAGNOSIS — R1013 Epigastric pain: Secondary | ICD-10-CM | POA: Diagnosis not present

## 2021-04-28 DIAGNOSIS — D689 Coagulation defect, unspecified: Secondary | ICD-10-CM | POA: Diagnosis not present

## 2021-04-28 DIAGNOSIS — J42 Unspecified chronic bronchitis: Secondary | ICD-10-CM | POA: Diagnosis not present

## 2021-04-28 DIAGNOSIS — J4520 Mild intermittent asthma, uncomplicated: Secondary | ICD-10-CM | POA: Diagnosis not present

## 2021-04-28 DIAGNOSIS — I4891 Unspecified atrial fibrillation: Secondary | ICD-10-CM | POA: Diagnosis not present

## 2021-04-28 DIAGNOSIS — M454 Ankylosing spondylitis of thoracic region: Secondary | ICD-10-CM | POA: Diagnosis not present

## 2021-04-28 DIAGNOSIS — E782 Mixed hyperlipidemia: Secondary | ICD-10-CM | POA: Diagnosis not present

## 2021-04-28 DIAGNOSIS — I5032 Chronic diastolic (congestive) heart failure: Secondary | ICD-10-CM | POA: Diagnosis not present

## 2021-04-28 DIAGNOSIS — R5381 Other malaise: Secondary | ICD-10-CM | POA: Diagnosis not present

## 2021-04-28 DIAGNOSIS — J309 Allergic rhinitis, unspecified: Secondary | ICD-10-CM | POA: Diagnosis not present

## 2021-04-28 DIAGNOSIS — M47814 Spondylosis without myelopathy or radiculopathy, thoracic region: Secondary | ICD-10-CM | POA: Diagnosis not present

## 2021-04-28 DIAGNOSIS — L89154 Pressure ulcer of sacral region, stage 4: Secondary | ICD-10-CM | POA: Diagnosis not present

## 2021-04-28 LAB — BASIC METABOLIC PANEL
Anion gap: 10 (ref 5–15)
BUN: 32 mg/dL — ABNORMAL HIGH (ref 8–23)
CO2: 25 mmol/L (ref 22–32)
Calcium: 9.4 mg/dL (ref 8.9–10.3)
Chloride: 96 mmol/L — ABNORMAL LOW (ref 98–111)
Creatinine, Ser: 3.29 mg/dL — ABNORMAL HIGH (ref 0.44–1.00)
GFR, Estimated: 14 mL/min — ABNORMAL LOW (ref >60)
Glucose, Bld: 93 mg/dL (ref 70–99)
Potassium: 4.1 mmol/L (ref 3.5–5.1)
Sodium: 131 mmol/L — ABNORMAL LOW (ref 135–145)

## 2021-04-28 LAB — CBC
HCT: 29.3 % — ABNORMAL LOW (ref 36.0–46.0)
Hemoglobin: 8.9 g/dL — ABNORMAL LOW (ref 12.0–15.0)
MCH: 30.5 pg (ref 26.0–34.0)
MCHC: 30.4 g/dL (ref 30.0–36.0)
MCV: 100.3 fL — ABNORMAL HIGH (ref 80.0–100.0)
Platelets: 164 10*3/uL (ref 150–400)
RBC: 2.92 MIL/uL — ABNORMAL LOW (ref 3.87–5.11)
RDW: 20.2 % — ABNORMAL HIGH (ref 11.5–15.5)
WBC: 7.1 10*3/uL (ref 4.0–10.5)
nRBC: 0.4 % — ABNORMAL HIGH (ref 0.0–0.2)

## 2021-04-28 LAB — GLUCOSE, CAPILLARY
Glucose-Capillary: 90 mg/dL (ref 70–99)
Glucose-Capillary: 149 mg/dL — ABNORMAL HIGH (ref 70–99)

## 2021-04-28 LAB — MAGNESIUM: Magnesium: 2.4 mg/dL (ref 1.7–2.4)

## 2021-04-28 MED ORDER — MIDODRINE HCL 10 MG PO TABS: 10.0000 mg | ORAL_TABLET | Freq: Three times a day (TID) | ORAL | Status: DC

## 2021-04-28 MED ORDER — HYDROCODONE-ACETAMINOPHEN 5-325 MG PO TABS: 1.0000 | ORAL_TABLET | Freq: Four times a day (QID) | ORAL | 0 refills | Status: DC | PRN

## 2021-04-28 MED ORDER — POLYETHYLENE GLYCOL 3350 17 G PO PACK: 17.0000 g | PACK | Freq: Two times a day (BID) | ORAL | 0 refills | Status: DC

## 2021-04-28 MED ORDER — SIMETHICONE 80 MG PO CHEW: 80.0000 mg | CHEWABLE_TABLET | Freq: Four times a day (QID) | ORAL | 0 refills | Status: DC | PRN

## 2021-04-28 MED ORDER — SUCRALFATE 1 G PO TABS
1.0000 g | ORAL_TABLET | Freq: Three times a day (TID) | ORAL | Status: DC
Start: 2021-04-28 — End: 2021-10-23

## 2021-04-28 MED ORDER — LINACLOTIDE 145 MCG PO CAPS
145.0000 ug | ORAL_CAPSULE | Freq: Every day | ORAL | Status: DC
Start: 2021-04-29 — End: 2021-05-27

## 2021-04-28 MED ORDER — SENNOSIDES-DOCUSATE SODIUM 8.6-50 MG PO TABS
1.0000 | ORAL_TABLET | Freq: Two times a day (BID) | ORAL | Status: DC
Start: 2021-04-28 — End: 2021-09-04

## 2021-04-28 NOTE — TOC Transition Note (Signed)
Transition of Care (TOC) - CM/SW Discharge Note ? ? ?Patient Details  ?Name: [name] ?MRN: [id number] ?Date of Birth: Feb 23, 1945 ? ?Transition of Care (TOC) CM/SW Contact:  ?Carolin Sicks, LCSWA ?Phone Number: ?04/28/2021, 11:32 AM ? ? ?Clinical Narrative:    ?Patient will Discharge To: [place] ?Anticipated DC Date:April 28, 2021 ?Family Notified:yes, son [name], [phone] ?Transport MV:[facility] ? ? ?Per MD patient ready for DC to  [facility]. RN, patient, patient's family, and facility notified of DC. Assessment, Fl2/Pasrr, and Discharge Summary sent to facility. RN given number for report [phone], Room 130 B). DC packet on chart. Ambulance transport requested for patient for 12:30pm ? ?CSW signing off. ? ?Reed Breech ?LCSWA ?[phone] ?  ? ? ?Final next level of care: [place] ?Barriers to Discharge: No Barriers Identified ? ? ?Patient Goals and CMS Choice ?  ?CMS Medicare.gov Compare Post Acute Care list provided to:: Patient Represenative (must comment) ?Choice offered to / list presented to : Adult Children ? ?Discharge Placement ?  ?           ?Patient chooses bed at: [facility] ?Patient to be transferred to facility by: [facility] ?Name of family member notified: [name] ?Patient and family notified of of transfer: 04/28/21 ? ?Discharge Plan and Services ?In-house Referral: Clinical Social Work ?  ?Post Acute Care Choice: [place]          ?  ?  ?  ?  ?  ?  ?  ?  ?  ?  ? ?Social Determinants of Health (SDOH) Interventions ?  ? ? ?Readmission Risk Interventions ?Readmission Risk Prevention Plan 01/10/2021  ?Transportation Screening Complete  ?PCP or Specialist Appt within 3-5 Days Complete  ?[place] or Home Care Consult Complete  ?Social Work Consult for [place] Planning/Counseling Complete  ?Palliative Care Screening Not Applicable  ?Medication Review Press photographer) Complete  ?Some recent data might be hidden  ? ? ? ? ? ?

## 2021-04-28 NOTE — Plan of Care (Signed)
?  Problem: Education: ?Goal: Knowledge of General Education information will improve ?Description: Including pain rating scale, medication(s)/side effects and non-pharmacologic comfort measures ?Outcome: Adequate for Discharge ?  ?Problem: Health Behavior/Discharge Planning: ?Goal: Ability to manage health-related needs will improve ?Outcome: Adequate for Discharge ?  ?Problem: Clinical Measurements: ?Goal: Ability to maintain clinical measurements within normal limits will improve ?Outcome: Adequate for Discharge ?Goal: Will remain free from infection ?Outcome: Adequate for Discharge ?Goal: Diagnostic test results will improve ?Outcome: Adequate for Discharge ?Goal: Respiratory complications will improve ?Outcome: Adequate for Discharge ?Goal: Cardiovascular complication will be avoided ?Outcome: Adequate for Discharge ?  ?Problem: Activity: ?Goal: Risk for activity intolerance will decrease ?Outcome: Adequate for Discharge ?  ?Problem: Coping: ?Goal: Level of anxiety will decrease ?Outcome: Adequate for Discharge ?  ?Problem: Elimination: ?Goal: Will not experience complications related to bowel motility ?Outcome: Adequate for Discharge ?Goal: Will not experience complications related to urinary retention ?Outcome: Adequate for Discharge ?  ?Problem: Pain Managment: ?Goal: General experience of comfort will improve ?Outcome: Adequate for Discharge ?  ?Problem: Safety: ?Goal: Ability to remain free from injury will improve ?Outcome: Adequate for Discharge ?  ?Problem: Skin Integrity: ?Goal: Risk for impaired skin integrity will decrease ?Outcome: Adequate for Discharge ?  ?Problem: Education: ?Goal: Knowledge of disease and its progression will improve ?Outcome: Adequate for Discharge ?Goal: Individualized Educational Video(s) ?Outcome: Adequate for Discharge ?  ?Problem: Fluid Volume: ?Goal: Compliance with measures to maintain balanced fluid volume will improve ?Outcome: Adequate for Discharge ?  ?Problem: Health  Behavior/Discharge Planning: ?Goal: Ability to manage health-related needs will improve ?Outcome: Adequate for Discharge ?  ?Problem: Nutritional: ?Goal: Ability to make healthy dietary choices will improve ?Outcome: Adequate for Discharge ?  ?Problem: Clinical Measurements: ?Goal: Complications related to the disease process, condition or treatment will be avoided or minimized ?Outcome: Adequate for Discharge ?  ?Problem: Education: ?Goal: Knowledge of General Education information will improve ?Description: Including pain rating scale, medication(s)/side effects and non-pharmacologic comfort measures ?Outcome: Adequate for Discharge ?  ?Problem: Health Behavior/Discharge Planning: ?Goal: Ability to manage health-related needs will improve ?Outcome: Adequate for Discharge ?  ?Problem: Clinical Measurements: ?Goal: Ability to maintain clinical measurements within normal limits will improve ?Outcome: Adequate for Discharge ?Goal: Will remain free from infection ?Outcome: Adequate for Discharge ?Goal: Diagnostic test results will improve ?Outcome: Adequate for Discharge ?Goal: Respiratory complications will improve ?Outcome: Adequate for Discharge ?Goal: Cardiovascular complication will be avoided ?Outcome: Adequate for Discharge ?  ?Problem: Activity: ?Goal: Risk for activity intolerance will decrease ?Outcome: Adequate for Discharge ?  ?Problem: Nutrition: ?Goal: Adequate nutrition will be maintained ?Outcome: Adequate for Discharge ?  ?Problem: Coping: ?Goal: Level of anxiety will decrease ?Outcome: Adequate for Discharge ?  ?Problem: Elimination: ?Goal: Will not experience complications related to bowel motility ?Outcome: Adequate for Discharge ?Goal: Will not experience complications related to urinary retention ?Outcome: Adequate for Discharge ?  ?Problem: Pain Managment: ?Goal: General experience of comfort will improve ?Outcome: Adequate for Discharge ?  ?Problem: Safety: ?Goal: Ability to remain free from  injury will improve ?Outcome: Adequate for Discharge ?  ?Problem: Skin Integrity: ?Goal: Risk for impaired skin integrity will decrease ?Outcome: Adequate for Discharge ?  ?

## 2021-04-28 NOTE — Progress Notes (Addendum)
?[facility] ?Progress Note  ? ?Subjective: Patient feels well today with no complaints.  Tolerated dialysis.  Possible discharge ? ? ?Objective ?Vitals:  ? 04/27/21 2100 04/28/21 0058 04/28/21 0300 04/28/21 0413  ?BP:  (!) 135/57  119/86  ?Pulse:  75 78 75  ?Resp:  '20 17 18  '$ ?Temp:      ?TempSrc:      ?SpO2: 99% 99% 100% 98%  ?Weight:      ?Height:      ? ?Physical Exam ?General: obese woman lying in bed, no distress ?Heart: Normal rate ?Lungs: Bilateral chest rise with no increased work of breathing ?Abdomen: soft, obese ?Extremities: 1+ bilateral lower extremity edema, warm and well perfused ?Dialysis Access:  [facility] c/d/I some bruising but no erythema ? ?Additional Objective ?Labs: ?Basic Metabolic Panel: ?Recent Labs  ?Lab 04/24/21 ?0215 04/25/21 ?0256 04/25/21 ?8115 04/26/21 ?7262 04/28/21 ?0159  ?NA 133* 132*  --  133*  133* 131*  ?K 4.3 4.3  --  4.4  4.4 4.1  ?CL 95* 96*  --  97*  97* 96*  ?CO2 24 24  --  '27  27 25  '$ ?GLUCOSE 100* 106*  --  109*  108* 93  ?BUN 36* 53*  --  31*  31* 32*  ?CREATININE 2.88* 4.27*  --  3.14*  3.15* 3.29*  ?CALCIUM 9.2 9.3 9.3 9.2  9.2 9.4  ?PHOS 4.1 4.0  --  4.3  --   ? ?Liver Function Tests: ?Recent Labs  ?Lab 04/23/21 ?0246 04/24/21 ?0215 04/25/21 ?0256 04/26/21 ?0355  ?AST 13* 18  --  20  ?ALT 18 16  --  19  ?ALKPHOS 135* 117  --  112  ?BILITOT 0.4 0.8  --  0.5  ?PROT 6.2* 6.2*  --  5.8*  ?ALBUMIN 2.3* 2.3* 2.4* 2.3*  2.3*  ? ?No results for input(s): LIPASE, AMYLASE in the last 168 hours. ?CBC: ?Recent Labs  ?Lab 04/23/21 ?0246 04/24/21 ?0215 04/26/21 ?9741 04/28/21 ?0159  ?WBC 9.1 8.4 3.5* 7.1  ?NEUTROABS 7.8* 6.9  --   --   ?HGB 8.1* 8.5* 8.0* 8.9*  ?HCT 25.8* 27.8* 26.1* 29.3*  ?MCV 96.6 98.6 99.2 100.3*  ?PLT 138* 146* 164 164  ? ?Blood Culture ?   ?Component Value Date/Time  ? SDES IN/OUT CATH URINE 04/22/2021 1522  ? SPECREQUEST  04/22/2021 1522  ?  NONE ?Performed at [facility], [place] [address] [place], [place] [address] ?  ? CULT  >=100,000 COLONIES/mL PROTEUS MIRABILIS (A) 04/22/2021 1522  ? REPTSTATUS 04/24/2021 FINAL 04/22/2021 1522  ? ? ?Cardiac Enzymes: ?No results for input(s): CKTOTAL, CKMB, CKMBINDEX, TROPONINI in the last 168 hours. ?CBG: ?Recent Labs  ?Lab 04/27/21 ?0818 04/27/21 ?1205 04/27/21 ?1740 04/27/21 ?1929 04/28/21 ?0731  ?GLUCAP 89 130* 116* 134* 90  ? ?Iron Studies:  ?No results for input(s): IRON, TIBC, TRANSFERRIN, FERRITIN in the last 72 hours. ? ?'@lablastinr3'$ @ ?Studies/Results: ?No results found. ?Medications: ? sodium chloride    ? ? allopurinol  100 mg Oral Daily  ? aspirin  81 mg Oral Daily  ? calcium acetate  667 mg Oral TID WC  ? Chlorhexidine Gluconate Cloth  6 each Topical Q0600  ? clopidogrel  75 mg Oral Daily  ? cycloSPORINE  1 drop Both Eyes BID  ? darbepoetin (ARANESP) injection - DIALYSIS  100 mcg Intravenous Q Tue-HD  ? heparin injection (subcutaneous)  5,000 Units Subcutaneous Q8H  ? hydrocortisone cream   Topical BID  ? insulin  aspart  0-6 Units Subcutaneous TID WC  ? lidocaine  1 patch Transdermal Q24H  ? linaclotide  145 mcg Oral QAC breakfast  ? mouth rinse  15 mL Mouth Rinse q12n4p  ? midodrine  10 mg Oral 3 times per day on Tue Thu Sat  ? mometasone-formoterol  2 puff Inhalation BID  ? pantoprazole  40 mg Oral BID  ? polyethylene glycol  17 g Oral BID  ? polyvinyl alcohol  1 drop Both Eyes Q12H  ? predniSONE  10 mg Oral Q breakfast  ? rosuvastatin  5 mg Oral QPM  ? simethicone  80 mg Oral QID  ? sodium chloride flush  10-40 mL Intracatheter Q12H  ? sucralfate  1 g Oral TID WC & HS  ? ? ?Assessment/Plan: ?**AKI on CKD3:  baseline Cr in the 1.4-1.8 range now with anuric severe AKI following THR.  Renal US 2/25 CKD w/o obstruction. Thought to be ATN in the setting of post op hypotension.  CRRT 3/2 then converted to HD and continues to require ongoing HD.  On a TTS schedule at this time.  Now on midodrine, continue.  Continue dialysis per TTS schedule.  Has been accepted for outpatient  unit. ? ?**Anemia:  Hb in 8-9s, sat 12% so IV iron and ESA ordered.   ? ?**hyponatremia:  mild, hypervolemic + impaired free water excretion with oliguric AKI, stable/improving with HD.  ? ?**Hyperkalemia: Resolved with dialysis ? ?**BMM:  ca and phos ok - on  binder.  PTH 36 ? ?**S/p hip fracture - operative repair 2/22 ? ?[place] TTS arranged.  Likely discharging today with plans to start dialysis Tuesday outpatient ? ?From Jerlyn Ly: Tuesday, March 21, pt needs to arrive at 11:10 to complete paperwork for 12:00 chair time. After first appt, pt will need to arrive at 6:40 for 7:00 chair time. Navigator provided this info to CSW to provide to snf. Navigator also spoke to pt's son, [name], via phone and pt at bedside to make aware of the above arrangements. Schedule letter provided to pt at bedside and a picture of letter sent to pt's son via text. Clinic will need orders at d/c. Renal NP made aware pt may d/c this weekend. Above arrangements placed on pt's AVS as well. Will assist as needed.  ? ? ? ? ?

## 2021-04-28 NOTE — Discharge Summary (Signed)
? ?Physician Discharge Summary  ?[name] RCB:[id number] DOB: 11-20-1945 DOA: 04/03/2021 ? ?PCP: Lujean Amel, MD ? ?Admit date: 04/03/2021 ?Discharge date: 04/28/2021 ? ?Admitted From: home ?Disposition:  SNF ? ?Recommendations for Outpatient Follow-up:  ?Follow up with HD as scheduled in 2 days ? ?Home Health: none ?Equipment/Devices: none ? ?Discharge Condition: stable ?CODE STATUS: Full code ?Diet recommendation: renal, heart healthy ? ?HPI: Per admitting MD, ?[name] is a 76 y.o. female with medical history significant of  CAD s/p CABG '16 (LIMA to LAD, SVG to D1, SVG to OM1 and OM2, SVG to PDA), HFpEF, obesity, adrenal insufficiency, diabetes, hypertension, CKD stage III, OSA/obesity-hypoventilation syndrome on 3 L O2 chronically, complete heart block s/p PPM/ICD presents after having a fall at home.  At baseline patient ambulates with the use of a walker and notes that her son lives with her.  This morning at around 7 AM she was up and was not using her walker when her left leg gave out and she landed on her left side.  Denies any loss of consciousness or trauma to her head.  She felt immediate pain in her left hip and leg.  She was unable to move the leg or bear weight due to it worsening her pain.  She reports that she was lucky that her son was off from work and was able to call 911.  Her weight is down a couple pounds since she had last seen Dr. [name] 2 days ago where he had recommended her to be on torsemide 60 mg daily. Upon admission into the emergency department patient was seen to be mildly tachypneic with all other vital signs maintained.  Labs noted hemoglobin 10.8, platelets 139 potassium 3.4, CO2 33, BUN 18, creatinine 1.69, and BNP 333.8.  Chest x-ray noted cardiomegaly with mild pulmonary vascular congestion.  X-rays noted a displaced intertrochanteric left hip fracture with left superior pubic ramus fracture.  Orthopedics have been formally consulted. ? ?Hospital Course /  Discharge diagnoses: ?Principal Problem: ?  Closed left hip fracture ([place]) secondary to fall at home ?Active Problems: ?  Back pain ?  Thrombocytopenia ([place]) ?  ANEMIA ?  HTN (hypertension), benign ?  Mixed hypercholesterolemia and hypertriglyceridemia ?  Abdominal pain ?  S/P CABG x 5 ?  Chronic diastolic CHF (congestive heart failure) ([place]) ?  CHB (complete heart block) (HCC) ?  Diabetes mellitus without complication ([place]) ?  Respiratory failure ([place]) ?  Irritable bowel syndrome with constipation ?  Closed fracture of left superior pubic ramus (HCC) ?  Chronic kidney disease, stage III B (moderate) (HCC) ?  Fall at home, initial encounter ?  Hypokalemia ?  Pressure injury of skin ?  Subcutaneous fat necrosis ? ? ?Assessment and Plan: ?Principal problem ?Closed left hip fracture ([place]) secondary to fall at home -Patient presents after having a fall found to have a left hip and left superior pubic ramus fracture.  Orthopedic surgery consulted, she is status post ORIF. Needs ongoing rehab, to be discharged to SNF ?  ?Active problems ?Abdominal pain -CT scan without acute findings.  Somewhat acute on chronic as she saw GI in the past for this.  She tells me that the pain is much worse and especially after she eats.  A CT angiogram could not be obtained due to her renal failure, but underwent a mesenteric ultrasound which did not show any significant obstructions.  GI consulted and followed patient while hospitalized.  He was found to be due to  excessive stool burden, she was placed on aggressive bowel regimen with significant improvement in her pain has now resolved and is tolerating a regular diet.  Continue to ensure adequate BMs after discharge ?Acute kidney injury on chronic kidney disease, stage III B, now dialysis dependent-nephrology following, currently on dialysis, she is currently on TTS schedule.  Per nephrology, there is some chance of recovery ?Back pain -Complaining of back Pain but has not been OOB much.   Thoracic spine x-ray without acute findings but diffuse intervertebral ankylosis with generalized osteopenia.  Conservative management, pain control ?Thrombocytopenia ([place]) -resolved ?Hypokalemia -continue to monitor and replete as indicated ?Closed fracture of left superior pubic ramus (HCC) -Secondary to fall.  No surgical intervention recommended for treatment of this fracture. ?Acute on chronic hypoxic hypoxic hypercapnic respiratory failure -Patient has history of obesity hypoventilation syndrome and OSA.  She is chronically on 3 L of oxygen at baseline.  Respiratory status remains at baseline ?Diabetes mellitus without complication (HCC) -Last hemoglobin A1c was 6.5 less than 3 months ago.  Patient is on Jardiance 10 mg daily.  CBGs with good control ?CHB (complete heart block) (HCC) -S/p PPM/ICD  ?Chronic diastolic CHF (congestive heart failure) (HCC) -Chronic.  Fluid management per HD ?S/P CABG x 5 -Last cardiac catheterization was in 02/2019 where all territories were noted to have adequate coronary flow via bypasses or native vessels with stents. Continue aspirin, Plavix, and Crestor ?Mixed hypercholesterolemia and hypertriglyceridemia -Continue Crestor ?HTN (hypertension), benign -continue regimen as below, blood pressure remained stable ?Anemia in the setting of renal disease-CBC stable ? ? ?Sepsis ruled out ? ? ?Discharge Instructions ? ? ?Allergies as of 04/28/2021   ? ?   Reactions  ? Ivp Dye [iodinated Contrast Media] Shortness Of Breath  ? No reaction to PO contrast with non-ionic dye.06-25-2014/rsm  ? Shellfish Allergy Anaphylaxis  ? Sulfa Antibiotics Shortness Of Breath  ? Iodine Hives  ? Atorvastatin Other (See Comments)  ? Pt states "causes bilateral leg pain/cramps."  ? Benadryl [diphenhydramine] Other (See Comments)  ? "THIS DRIVES ME CRAZY AND MAKES ME FEEL LIKE I AM DYING" ?No problem last time they took  ? Colchicine Nausea And Vomiting  ? Contrast Media [iodinated Contrast Media] Hives  ?  Doxycycline Other (See Comments)  ? Unknown reaction, per patient  ? Uloric [febuxostat] Other (See Comments)  ? Unknown reaction  ? Cephalexin Itching, Rash  ? Zithromax [azithromycin] Rash  ? ?  ? ?  ?Medication List  ?  ? ?STOP taking these medications   ? ?bisoprolol 5 MG tablet ?Commonly known as: ZEBETA ?  ?potassium chloride SA 20 MEQ tablet ?Commonly known as: KLOR-CON M ?  ?torsemide 20 MG tablet ?Commonly known as: DEMADEX ?  ?Torsemide 40 MG Tabs ?  ? ?  ? ?TAKE these medications   ? ?Accu-Chek Aviva Plus test strip ?Generic drug: glucose blood ?Check blood sugar twice daily ?  ?Accu-Chek Softclix Lancets lancets ?Check blood sugar twice daily ?  ?acetaminophen 500 MG tablet ?Commonly known as: TYLENOL ?Take 1,000 mg by mouth 2 (two) times daily as needed (for pain or headaches). ?  ?albuterol 108 (90 Base) MCG/ACT inhaler ?Commonly known as: VENTOLIN HFA ?Inhale 2 puffs into the lungs every 6 (six) hours as needed for wheezing or shortness of breath. ?  ?albuterol (2.5 MG/3ML) 0.083% nebulizer solution ?Commonly known as: PROVENTIL ?Take 3 mLs (2.5 mg total) by nebulization every 6 (six) hours as needed for wheezing or shortness of breath. ?  ?  allopurinol 100 MG tablet ?Commonly known as: ZYLOPRIM ?Take 1 tablet (100 mg total) by mouth daily. ?  ?aspirin EC 81 MG tablet ?Take 1 tablet (81 mg total) by mouth daily. ?  ?B-D SINGLE USE SWABS REGULAR Pads ?  ?budesonide-formoterol 160-4.5 MCG/ACT inhaler ?Commonly known as: SYMBICORT ?Inhale 2 puffs into the lungs 2 (two) times daily. ?  ?carboxymethylcellulose 0.5 % Soln ?Commonly known as: REFRESH PLUS ?Place 1 drop into both eyes every 12 (twelve) hours. ?  ?clopidogrel 75 MG tablet ?Commonly known as: PLAVIX ?Take 1 tablet (75 mg total) by mouth daily. ?  ?docusate sodium 100 MG capsule ?Commonly known as: COLACE ?Take 100 mg by mouth at bedtime. ?  ?EPINEPHrine 0.3 mg/0.3 mL Soaj injection ?Commonly known as: EPI-PEN ?Inject 0.3 mg into the muscle once  as needed for anaphylaxis (severe allergic reaction). ?  ?guaiFENesin 600 MG 12 hr tablet ?Commonly known as: [place] ?Take 1 tablet (600 mg total) by mouth 2 (two) times daily. ?What changed:  ?when to

## 2021-04-29 DIAGNOSIS — S72142D Displaced intertrochanteric fracture of left femur, subsequent encounter for closed fracture with routine healing: Secondary | ICD-10-CM | POA: Diagnosis not present

## 2021-04-29 DIAGNOSIS — S32512D Fracture of superior rim of left pubis, subsequent encounter for fracture with routine healing: Secondary | ICD-10-CM | POA: Diagnosis not present

## 2021-04-29 DIAGNOSIS — L89150 Pressure ulcer of sacral region, unstageable: Secondary | ICD-10-CM | POA: Diagnosis not present

## 2021-04-29 NOTE — Progress Notes (Signed)
Late Entry Note: ? ?Contacted [facility] this morning to make clinic aware pt was d/c to snf yesterday and will start at clinic tomorrow. Pt, pt's family, and CSW were advised Friday of dialysis arrangements.  ? ?Melven Sartorius ?Renal Navigator ?[phone] ?

## 2021-04-30 ENCOUNTER — Telehealth: Payer: Self-pay | Admitting: Gastroenterology

## 2021-04-30 DIAGNOSIS — Z992 Dependence on renal dialysis: Secondary | ICD-10-CM | POA: Diagnosis not present

## 2021-04-30 DIAGNOSIS — J810 Acute pulmonary edema: Secondary | ICD-10-CM | POA: Diagnosis not present

## 2021-04-30 DIAGNOSIS — J9622 Acute and chronic respiratory failure with hypercapnia: Secondary | ICD-10-CM | POA: Diagnosis not present

## 2021-04-30 DIAGNOSIS — Z23 Encounter for immunization: Secondary | ICD-10-CM | POA: Diagnosis not present

## 2021-04-30 DIAGNOSIS — N178 Other acute kidney failure: Secondary | ICD-10-CM | POA: Diagnosis not present

## 2021-04-30 DIAGNOSIS — I25118 Atherosclerotic heart disease of native coronary artery with other forms of angina pectoris: Secondary | ICD-10-CM | POA: Diagnosis not present

## 2021-04-30 DIAGNOSIS — D631 Anemia in chronic kidney disease: Secondary | ICD-10-CM | POA: Diagnosis not present

## 2021-04-30 DIAGNOSIS — N186 End stage renal disease: Secondary | ICD-10-CM | POA: Diagnosis not present

## 2021-04-30 DIAGNOSIS — M454 Ankylosing spondylitis of thoracic region: Secondary | ICD-10-CM | POA: Diagnosis not present

## 2021-04-30 DIAGNOSIS — N179 Acute kidney failure, unspecified: Secondary | ICD-10-CM | POA: Diagnosis not present

## 2021-04-30 DIAGNOSIS — D689 Coagulation defect, unspecified: Secondary | ICD-10-CM | POA: Diagnosis not present

## 2021-04-30 DIAGNOSIS — J9621 Acute and chronic respiratory failure with hypoxia: Secondary | ICD-10-CM | POA: Diagnosis not present

## 2021-04-30 DIAGNOSIS — I442 Atrioventricular block, complete: Secondary | ICD-10-CM | POA: Diagnosis not present

## 2021-04-30 DIAGNOSIS — S72142D Displaced intertrochanteric fracture of left femur, subsequent encounter for closed fracture with routine healing: Secondary | ICD-10-CM | POA: Diagnosis not present

## 2021-04-30 DIAGNOSIS — J309 Allergic rhinitis, unspecified: Secondary | ICD-10-CM | POA: Diagnosis not present

## 2021-04-30 DIAGNOSIS — I517 Cardiomegaly: Secondary | ICD-10-CM | POA: Diagnosis not present

## 2021-04-30 DIAGNOSIS — I429 Cardiomyopathy, unspecified: Secondary | ICD-10-CM | POA: Diagnosis not present

## 2021-04-30 NOTE — Telephone Encounter (Signed)
Patient son [name] [phone]) called seeking advise for mother states she still has abdominal pain just like she had at the hospital (04/03/21).  ?

## 2021-04-30 NOTE — Telephone Encounter (Signed)
Left message on machine to call back  

## 2021-05-01 DIAGNOSIS — M545 Low back pain, unspecified: Secondary | ICD-10-CM | POA: Diagnosis not present

## 2021-05-01 DIAGNOSIS — M6281 Muscle weakness (generalized): Secondary | ICD-10-CM | POA: Diagnosis not present

## 2021-05-01 DIAGNOSIS — R5381 Other malaise: Secondary | ICD-10-CM | POA: Diagnosis not present

## 2021-05-01 DIAGNOSIS — R262 Difficulty in walking, not elsewhere classified: Secondary | ICD-10-CM | POA: Diagnosis not present

## 2021-05-02 ENCOUNTER — Encounter (HOSPITAL_COMMUNITY): Payer: Self-pay | Admitting: Pharmacy Technician

## 2021-05-02 ENCOUNTER — Emergency Department (HOSPITAL_COMMUNITY)
Admission: EM | Admit: 2021-05-02 | Discharge: 2021-05-02 | Disposition: A | Discharge status: Skilled Nursing Facility | Payer: Medicare Other | Attending: Emergency Medicine | Admitting: Emergency Medicine

## 2021-05-02 ENCOUNTER — Emergency Department (HOSPITAL_COMMUNITY): Payer: Medicare Other

## 2021-05-02 DIAGNOSIS — M454 Ankylosing spondylitis of thoracic region: Secondary | ICD-10-CM | POA: Diagnosis not present

## 2021-05-02 DIAGNOSIS — J9811 Atelectasis: Secondary | ICD-10-CM | POA: Diagnosis not present

## 2021-05-02 DIAGNOSIS — D631 Anemia in chronic kidney disease: Secondary | ICD-10-CM | POA: Diagnosis not present

## 2021-05-02 DIAGNOSIS — I442 Atrioventricular block, complete: Secondary | ICD-10-CM | POA: Diagnosis not present

## 2021-05-02 DIAGNOSIS — N179 Acute kidney failure, unspecified: Secondary | ICD-10-CM | POA: Diagnosis not present

## 2021-05-02 DIAGNOSIS — I517 Cardiomegaly: Secondary | ICD-10-CM | POA: Diagnosis not present

## 2021-05-02 DIAGNOSIS — S72142D Displaced intertrochanteric fracture of left femur, subsequent encounter for closed fracture with routine healing: Secondary | ICD-10-CM | POA: Diagnosis not present

## 2021-05-02 DIAGNOSIS — J9622 Acute and chronic respiratory failure with hypercapnia: Secondary | ICD-10-CM | POA: Diagnosis not present

## 2021-05-02 DIAGNOSIS — M47814 Spondylosis without myelopathy or radiculopathy, thoracic region: Secondary | ICD-10-CM | POA: Diagnosis not present

## 2021-05-02 DIAGNOSIS — Z7982 Long term (current) use of aspirin: Secondary | ICD-10-CM | POA: Insufficient documentation

## 2021-05-02 DIAGNOSIS — N186 End stage renal disease: Secondary | ICD-10-CM | POA: Diagnosis not present

## 2021-05-02 DIAGNOSIS — J309 Allergic rhinitis, unspecified: Secondary | ICD-10-CM | POA: Diagnosis not present

## 2021-05-02 DIAGNOSIS — R69 Illness, unspecified: Secondary | ICD-10-CM

## 2021-05-02 DIAGNOSIS — I120 Hypertensive chronic kidney disease with stage 5 chronic kidney disease or end stage renal disease: Secondary | ICD-10-CM | POA: Diagnosis not present

## 2021-05-02 DIAGNOSIS — Z992 Dependence on renal dialysis: Secondary | ICD-10-CM | POA: Insufficient documentation

## 2021-05-02 DIAGNOSIS — R531 Weakness: Secondary | ICD-10-CM | POA: Diagnosis not present

## 2021-05-02 DIAGNOSIS — J849 Interstitial pulmonary disease, unspecified: Secondary | ICD-10-CM | POA: Diagnosis not present

## 2021-05-02 DIAGNOSIS — Z743 Need for continuous supervision: Secondary | ICD-10-CM | POA: Diagnosis not present

## 2021-05-02 DIAGNOSIS — I25118 Atherosclerotic heart disease of native coronary artery with other forms of angina pectoris: Secondary | ICD-10-CM | POA: Diagnosis not present

## 2021-05-02 DIAGNOSIS — Z9889 Other specified postprocedural states: Secondary | ICD-10-CM | POA: Diagnosis not present

## 2021-05-02 DIAGNOSIS — I429 Cardiomyopathy, unspecified: Secondary | ICD-10-CM | POA: Diagnosis not present

## 2021-05-02 DIAGNOSIS — J810 Acute pulmonary edema: Secondary | ICD-10-CM | POA: Diagnosis not present

## 2021-05-02 DIAGNOSIS — J9621 Acute and chronic respiratory failure with hypoxia: Secondary | ICD-10-CM | POA: Diagnosis not present

## 2021-05-02 LAB — CBC WITH DIFFERENTIAL/PLATELET
Abs Immature Granulocytes: 0.04 10*3/uL (ref 0.00–0.07)
Basophils Absolute: 0 10*3/uL (ref 0.0–0.1)
Basophils Relative: 1 %
Eosinophils Absolute: 0 10*3/uL (ref 0.0–0.5)
Eosinophils Relative: 0 %
HCT: 26.3 % — ABNORMAL LOW (ref 36.0–46.0)
Hemoglobin: 7.6 g/dL — ABNORMAL LOW (ref 12.0–15.0)
Immature Granulocytes: 1 %
Lymphocytes Relative: 7 %
Lymphs Abs: 0.5 10*3/uL — ABNORMAL LOW (ref 0.7–4.0)
MCH: 30.4 pg (ref 26.0–34.0)
MCHC: 28.9 g/dL — ABNORMAL LOW (ref 30.0–36.0)
MCV: 105.2 fL — ABNORMAL HIGH (ref 80.0–100.0)
Monocytes Absolute: 0.2 10*3/uL (ref 0.1–1.0)
Monocytes Relative: 4 %
Neutro Abs: 5.8 10*3/uL (ref 1.7–7.7)
Neutrophils Relative %: 87 %
Platelets: 132 10*3/uL — ABNORMAL LOW (ref 150–400)
RBC: 2.5 MIL/uL — ABNORMAL LOW (ref 3.87–5.11)
RDW: 21 % — ABNORMAL HIGH (ref 11.5–15.5)
WBC: 6.6 10*3/uL (ref 4.0–10.5)
nRBC: 0 % (ref 0.0–0.2)

## 2021-05-02 LAB — COMPREHENSIVE METABOLIC PANEL
ALT: 16 U/L (ref 0–44)
AST: 19 U/L (ref 15–41)
Albumin: 2.4 g/dL — ABNORMAL LOW (ref 3.5–5.0)
Alkaline Phosphatase: 120 U/L (ref 38–126)
Anion gap: 9 (ref 5–15)
BUN: 59 mg/dL — ABNORMAL HIGH (ref 8–23)
CO2: 28 mmol/L (ref 22–32)
Calcium: 8.6 mg/dL — ABNORMAL LOW (ref 8.9–10.3)
Chloride: 98 mmol/L (ref 98–111)
Creatinine, Ser: 4.94 mg/dL — ABNORMAL HIGH (ref 0.44–1.00)
GFR, Estimated: 9 mL/min — ABNORMAL LOW (ref >60)
Glucose, Bld: 175 mg/dL — ABNORMAL HIGH (ref 70–99)
Potassium: 4.2 mmol/L (ref 3.5–5.1)
Sodium: 135 mmol/L (ref 135–145)
Total Bilirubin: 0.7 mg/dL (ref 0.3–1.2)
Total Protein: 5.3 g/dL — ABNORMAL LOW (ref 6.5–8.1)

## 2021-05-02 NOTE — ED Provider Notes (Signed)
?[facility] ?Provider Note ? ? ?CSN: [id number] ?Arrival date & time: 05/02/21  1449 ? ?  ? ?History ? ?No chief complaint on file. ? ? ?[name] is a 76 y.o. female. ? ?HPI ?Patient recently started dialysis about 2 weeks ago.  She had kidney failure after hip fracture and replacement.  Patient reports that she missed her session today due to transportation miscommunication.  Patient is on a Tuesday Thursday Saturday schedule.  She denies that she feels short of breath.  She reports that she was told by the dialysis center to come in and get evaluated emergency department to determine if she needed dialysis today.  Patient reports she still recovering from her surgery and in rehab but feels like she is slowly making progress.  In addition to the hip fracture, she had a thoracic compression fracture and is wearing a TLSO.  She reports mobility is somewhat difficult and she is starting to get up and get into a wheelchair and do some transitioning. ?  ? ?Home Medications ?Prior to Admission medications   ?Medication Sig Start Date End Date Taking? Authorizing Provider  ?ACCU-CHEK AVIVA PLUS test strip Check blood sugar twice daily 10/19/19   [provider]  ?Accu-Chek Softclix Lancets lancets Check blood sugar twice daily 08/10/19   [provider]  ?acetaminophen (TYLENOL) 500 MG tablet Take 1,000 mg by mouth 2 (two) times daily as needed (for pain or headaches).    [provider]  ?albuterol (PROVENTIL HFA;VENTOLIN HFA) 108 (90 Base) MCG/ACT inhaler Inhale 2 puffs into the lungs every 6 (six) hours as needed for wheezing or shortness of breath. 05/02/18   Norval Morton, MD  ?albuterol (PROVENTIL) (2.5 MG/3ML) 0.083% nebulizer solution Take 3 mLs (2.5 mg total) by nebulization every 6 (six) hours as needed for wheezing or shortness of breath. ?Patient not taking: Reported on 04/03/2021 05/02/18   Norval Morton, MD  ?Alcohol Swabs (B-D SINGLE  USE SWABS REGULAR) PADS  08/10/19   [provider]  ?allopurinol (ZYLOPRIM) 100 MG tablet Take 1 tablet (100 mg total) by mouth daily. 03/14/21   Croitoru, [name], MD  ?aspirin EC 81 MG tablet Take 1 tablet (81 mg total) by mouth daily. 03/27/16   Erlene Quan, PA-C  ?budesonide-formoterol (SYMBICORT) 160-4.5 MCG/ACT inhaler Inhale 2 puffs into the lungs 2 (two) times daily.    [provider]  ?carboxymethylcellulose (REFRESH PLUS) 0.5 % SOLN Place 1 drop into both eyes every 12 (twelve) hours.    [provider]  ?clopidogrel (PLAVIX) 75 MG tablet Take 1 tablet (75 mg total) by mouth daily. 02/28/21   Croitoru, [name], MD  ?docusate sodium (COLACE) 100 MG capsule Take 100 mg by mouth at bedtime.    [provider]  ?empagliflozin (JARDIANCE) 10 MG TABS tablet Take 10 mg by mouth daily before breakfast. 06/30/19   Bensimhon, Shaune Pascal, MD  ?EPINEPHrine 0.3 mg/0.3 mL IJ SOAJ injection Inject 0.3 mg into the muscle once as needed for anaphylaxis (severe allergic reaction). ?Patient not taking: Reported on 04/01/2021    [provider]  ?guaiFENesin (MUCINEX) 600 MG 12 hr tablet Take 1 tablet (600 mg total) by mouth 2 (two) times daily. ?Patient taking differently: Take 600 mg by mouth 2 (two) times daily as needed for cough. 01/10/21   [name], NP  ?HYDROcodone-acetaminophen (NORCO/VICODIN) 5-325 MG tablet Take 1-2 tablets by mouth every 6 (six) hours as needed for moderate pain. 04/28/21  Caren Griffins, MD  ?isosorbide mononitrate (IMDUR) 30 MG 24 hr tablet Take 90 mg by mouth daily.    [provider]  ?linaclotide Rolan Lipa) 145 MCG CAPS capsule Take 1 capsule (145 mcg total) by mouth daily before breakfast. 04/29/21   Caren Griffins, MD  ?loratadine (CLARITIN) 10 MG tablet Take 10 mg by mouth daily. 11/23/19   [provider]  ?midodrine (PROAMATINE) 10 MG tablet Take 1 tablet (10 mg total) by mouth 3 (three) times daily with meals. 04/28/21    Caren Griffins, MD  ?nitroGLYCERIN (NITROSTAT) 0.4 MG SL tablet Place 1 tablet (0.4 mg total) under the tongue every 5 (five) minutes as needed for chest pain. ?Patient not taking: Reported on 04/01/2021 11/13/16 03/18/22  Erlene Quan, PA-C  ?OXYGEN Inhale 3 L/min into the lungs continuous.     [provider]  ?oxymetazoline (AFRIN) 0.05 % nasal spray Place 1 spray into both nostrils 2 (two) times daily as needed for congestion.    [provider]  ?pantoprazole (PROTONIX) 40 MG tablet Take 1 tablet (40 mg total) by mouth 2 (two) times daily. 07/08/19   [name], MD  ?polyethylene glycol (MIRALAX / GLYCOLAX) 17 g packet Take 17 g by mouth 2 (two) times daily. 04/28/21   Caren Griffins, MD  ?predniSONE (DELTASONE) 10 MG tablet Take 10 mg by mouth daily with breakfast.    [provider]  ?RESTASIS 0.05 % ophthalmic emulsion Place 1 drop into both eyes 2 (two) times daily. 03/19/21   [provider]  ?rosuvastatin (CRESTOR) 40 MG tablet Take 1 tablet (40 mg total) by mouth every evening. NEED OV. 09/19/20   [name], Laxman, MD  ?senna-docusate (SENOKOT-S) 8.6-50 MG tablet Take 1 tablet by mouth 2 (two) times daily. 04/28/21   Caren Griffins, MD  ?simethicone (MYLICON) 80 MG chewable tablet Chew 1 tablet (80 mg total) by mouth every 6 (six) hours as needed for flatulence. 04/28/21   Caren Griffins, MD  ?sucralfate (CARAFATE) 1 g tablet Take 1 tablet (1 g total) by mouth 4 (four) times daily -  with meals and at bedtime. 04/28/21   Caren Griffins, MD  ?   ? ?Allergies    ?Ivp dye [iodinated contrast media], Shellfish allergy, Sulfa antibiotics, Iodine, Atorvastatin, Benadryl [diphenhydramine], Colchicine, Contrast media [iodinated contrast media], Doxycycline, Uloric [febuxostat], Cephalexin, and Zithromax [azithromycin]   ? ?Review of Systems   ?Review of Systems ?10 systems reviewed negative except as per HPI ?Physical Exam ?Updated Vital Signs ?BP (!) 101/46 (BP  Location: Right Arm)   Pulse (!) 104   Temp 98.9 ?F (37.2 ?C) (Oral)   Resp 18   SpO2 98%  ?Physical Exam ? ?ED Results / Procedures / Treatments   ?Labs ?(all labs ordered are listed, but only abnormal results are displayed) ?Labs Reviewed  ?CBC WITH DIFFERENTIAL/PLATELET - Abnormal; Notable for the following components:  ?    Result Value  ? RBC 2.50 (*)   ? Hemoglobin 7.6 (*)   ? HCT 26.3 (*)   ? MCV 105.2 (*)   ? MCHC 28.9 (*)   ? RDW 21.0 (*)   ? Platelets 132 (*)   ? Lymphs Abs 0.5 (*)   ? All other components within normal limits  ?COMPREHENSIVE METABOLIC PANEL - Abnormal; Notable for the following components:  ? Glucose, Bld 175 (*)   ? BUN 59 (*)   ? Creatinine, Ser 4.94 (*)   ? Calcium  8.6 (*)   ? Total Protein 5.3 (*)   ? Albumin 2.4 (*)   ? GFR, Estimated 9 (*)   ? All other components within normal limits  ? ? ?EKG ?None ? ?Radiology ?DG Chest 1 View ? ?Result Date: 05/02/2021 ?CLINICAL DATA:  Missed dialysis EXAM: CHEST  1 VIEW COMPARISON:  Radiograph 04/23/2021 FINDINGS: Large metallic object overlies the left chest. Left neck approach central venous catheter tip overlies the right atrium. Unchanged pacemaker/AICD leads. Unchanged cardiomediastinal silhouette with prior median sternotomy and postsurgical changes of CABG. Bibasilar band like opacities, likely atelectasis. Mild interstitial prominence throughout the right lung. Shoulder osteoarthritis. Thoracic spondylosis. No acute osseous abnormality. IMPRESSION: Mild interstitial prominence throughout the right lung, possibly asymmetric interstitial edema. Bibasilar atelectasis. Large metallic object overlies the left chest. Electronically Signed   By: Maurine Simmering M.D.   On: 05/02/2021 19:15   ? ?Procedures ?Procedures  ? ? ?Medications Ordered in ED ?Medications - No data to display ? ?ED Course/ Medical Decision Making/ A&P ?  ?                        ?Medical Decision Making ?Amount and/or Complexity of Data Reviewed ?Radiology:  ordered. ? ? ?Patient presents for evaluation for missed dialysis.  Patient has missed 1 session.  She denies feeling symptomatic. ? ?Chest x-ray reviewed and visualized by myself.  Patient has a pacemaker and hardware pres

## 2021-05-02 NOTE — ED Notes (Signed)
PTAR to be called for patient  

## 2021-05-02 NOTE — Discharge Instructions (Signed)
1.  You have your scheduled dialysis on Saturday. ?2.  Restrict your fluid intake. ?3.  Return to the emergency department immediately if you start feeling short of breath. ?

## 2021-05-02 NOTE — ED Triage Notes (Signed)
Pt bib ems from [facility] after pt missed transportation to dialysis. Last treatment was Tuesday. Pt wears 4L [place].  ?

## 2021-05-02 NOTE — ED Provider Triage Note (Signed)
Emergency Medicine Provider Triage Evaluation Note ? ?[name] , a 76 y.o. female  was evaluated in triage.  Pt complains of missing dialysis today.  She is from rehab and states that they did not wake her up in time.  She last dialyzed 2 days ago.  She does not have any complaints. ? ?Review of Systems  ?Positive: No complaints ?Negative: No complaints ? ?Physical Exam  ?BP (!) 97/48 (BP Location: Right Arm)   Pulse 97   Temp 98.9 ?F (37.2 ?C) (Oral)   Resp 18   SpO2 92%  ?Gen:   Awake, no distress   ?Resp:  Normal effort  ?MSK:   Moves extremities without difficulty  ?Other:   ? ?Medical Decision Making  ?Medically screening exam initiated at 3:13 PM.  Appropriate orders placed.  HADASA GASNER was informed that the remainder of the evaluation will be completed by another provider, this initial triage assessment does not replace that evaluation, and the importance of remaining in the ED until their evaluation is complete. ? ? ?  ?Eustaquio Maize, PA-C ?05/02/21 1514 ? ?

## 2021-05-03 ENCOUNTER — Other Ambulatory Visit: Payer: Self-pay

## 2021-05-03 ENCOUNTER — Encounter (HOSPITAL_BASED_OUTPATIENT_CLINIC_OR_DEPARTMENT_OTHER): Payer: Self-pay

## 2021-05-03 ENCOUNTER — Ambulatory Visit (INDEPENDENT_AMBULATORY_CARE_PROVIDER_SITE_OTHER): Payer: Medicare Other | Admitting: Orthopaedic Surgery

## 2021-05-03 ENCOUNTER — Ambulatory Visit (HOSPITAL_BASED_OUTPATIENT_CLINIC_OR_DEPARTMENT_OTHER)
Admission: RE | Admit: 2021-05-03 | Discharge: 2021-05-03 | Disposition: A | Discharge status: Home / Self Care | Payer: Medicare Other | Source: Ambulatory Visit | Attending: Orthopaedic Surgery | Admitting: Orthopaedic Surgery

## 2021-05-03 ENCOUNTER — Other Ambulatory Visit (HOSPITAL_BASED_OUTPATIENT_CLINIC_OR_DEPARTMENT_OTHER): Payer: Self-pay | Admitting: Orthopaedic Surgery

## 2021-05-03 DIAGNOSIS — G8929 Other chronic pain: Secondary | ICD-10-CM

## 2021-05-03 DIAGNOSIS — S72002D Fracture of unspecified part of neck of left femur, subsequent encounter for closed fracture with routine healing: Secondary | ICD-10-CM

## 2021-05-03 DIAGNOSIS — M546 Pain in thoracic spine: Secondary | ICD-10-CM

## 2021-05-03 DIAGNOSIS — S72142A Displaced intertrochanteric fracture of left femur, initial encounter for closed fracture: Secondary | ICD-10-CM

## 2021-05-03 NOTE — Progress Notes (Signed)
Late entry  ? ?  ?Provided service recovery on patient. Recent notification was provided that patient may have been potentially exposed to Hepatitis B while in the [facility] D[facility] Unit ([facility]). Patient verbalized understanding. All questions have been answered. Attending MD and Infection Disease MD have been notified and will continue to follow patient.  ?  ?

## 2021-05-03 NOTE — Progress Notes (Signed)
? ?                            ? ? ?Post Operative Evaluation ?  ? ?Procedure/Date of Surgery: Cephalomedullary nail left hip none April 04, 2021 ? ?Interval History:  ? ? ?Presents today for 1 month follow-up on the left hip.  Unfortunately following her cephalomedullary nail she did have a significant period of time for which she was hospitalized.  She presents today with really no hip pain predominantly just with upper back pain.  She does have a T4 compression fracture that has been managed by neurology but she is very frustrated with this as she has been treated in a TLSO brace and she is no longer able to stand.  As result she has not been able to rehab her hip. ? ? ?PMH/PSH/Family History/Social History/Meds/Allergies:   ? ?Past Medical History:  ?Diagnosis Date  ? ABDOMINAL PAIN -GENERALIZED 02/21/2010  ? Qualifier: Diagnosis of  By: Chester Holstein NP, Nevin Bloodgood    ? AICD (automatic cardioverter/defibrillator) present   ? downgraded to CRT-P in 2014  ? Anemia   ? Arthritis   ? Asthma   ? Atrial fibrillation ([place]) 10/24/2015  ? Questionable history of A Fib/A Flutter. On device interrogation on 9/7, showed 0.27mn of A Fib/A Flutter with 1% burden.  Device check in Jan 2018 showed no sustained AF (runs of less than 30 seconds). No anticoagulation indicated.  ? CAD (coronary artery disease)   ? a. 10/09/16 LHC: SVG->LAD patent, SVG->Diag patent, SVG->RCA patent, SVG->LCx occluded. EF 60%, b. 10/31/16 LHC DES to AV groove Circ, DES to intermed branch  ? Cellulitis and abscess of foot 12/2014  ? RT FOOT  ? CHF (congestive heart failure) ([place]   ? Chronic bronchitis ([place]   ? Chronic lower back pain   ? Complete heart block [place] 06/22/2015  ? Diverticulosis   ? Facial numbness 02/2016  ? Fatty liver disease, nonalcoholic   ? Gastritis   ? GERD (gastroesophageal reflux disease)   ? Gout   ? H/O hiatal hernia   ? HEMORRHOIDS-EXTERNAL 02/21/2010  ? Qualifier: Diagnosis of  By: GChester HolsteinNP, PNevin Bloodgood   ? High cholesterol   ?  Hyperplastic colon polyp   ? Hypertension   ? IBS (irritable bowel syndrome)   ? Internal hemorrhoids   ? INTERNAL HEMORRHOIDS WITHOUT MENTION COMP 04/12/2007  ? Qualifier: Diagnosis of  By: BOlevia PerchesMD, DLowella Bandy  ? Nonischemic cardiomyopathy ([place] 11/22/2011  ? Pt responded to BiV ICD- last EF 55-60% Sept 2017  ? Obesity   ? On home oxygen therapy   ? "2L at night" (10/31/2016)  ? OSA (obstructive sleep apnea)   ? "can't tolerate a mask" (10/30/2016)  ? PERSONAL HX COLONIC POLYPS 02/21/2010  ? Qualifier: Diagnosis of  By: GChester HolsteinNP, PNevin Bloodgood   ? Pneumonia   ? "couple times" (10/31/2016)  ? Presence of permanent cardiac pacemaker   ? 11/02/12 [employer] V273 INTUA PPM  ? Right facial numbness 03/05/2016  ? Shortness of breath   ? Stroke ([facility] 09/2020  ? TIA (transient ischemic attack)   ? "recently" (10/31/2016)  ? Type II diabetes mellitus ([place]   ? ?Past Surgical History:  ?Procedure Laterality Date  ? ABDOMINAL ULTRASOUND  12/01/2011  ? Peripelvic cysts- #1- 2.4x1.9x2.3cm, #2-1.2x0.9x1.2cm  ? ANKLE FRACTURE SURGERY Right   ? "had rod put in"  ? ANTERIOR CERVICAL DECOMP/DISCECTOMY FUSION    ?  APPENDECTOMY    ? BACK SURGERY    ? BIOPSY  12/29/2017  ? Procedure: BIOPSY;  Surgeon: [name], MD;  Location: [facility];  Service: Endoscopy;;  ? BIV ICD INSERTION CRT-D  2001?  ? BIV PACEMAKER GENERATOR CHANGE OUT N/A 11/02/2012  ? Procedure: BIV PACEMAKER GENERATOR CHANGE OUT;  Surgeon: Sanda Klein, MD;  Location: [facility] CATH LAB;  Service: Cardiovascular;  Laterality: N/A;  ? CARDIAC CATHETERIZATION  05/17/1999  ? No significant coronary obstructive disease w/ mild 20% luminal irregularity of the first diag branch of the LAD  ? CARDIAC CATHETERIZATION  07/08/2002  ? No significant CAD, moderately depressed LV systolic function  ? CARDIAC CATHETERIZATION Bilateral 04/26/2007  ? Normal findings, recommend medical therapy  ? CARDIAC CATHETERIZATION  02/18/2008  ? Moderate CAD, would benefit from having a functional  study, recommend continue medical therapy  ? CARDIAC CATHETERIZATION  07/23/2012  ? Medical therapy  ? CARDIAC CATHETERIZATION N/A 11/24/2014  ? Procedure: Left Heart Cath and Coronary Angiography;  Surgeon: Troy Sine, MD;  Location: [facility] CV LAB;  Service: Cardiovascular;  Laterality: N/A;  ? CARDIAC CATHETERIZATION  11/27/2014  ? Procedure: Intravascular Pressure Wire/FFR Study;  Surgeon: Peter M Martinique, MD;  Location: [facility] CV LAB;  Service: Cardiovascular;;  ? CARDIAC CATHETERIZATION  10/09/2016  ? CHOLECYSTECTOMY N/A 04/09/2018  ? Procedure: LAPAROSCOPIC CHOLECYSTECTOMY;  Surgeon: Greer Pickerel, MD;  [facility] OR[facility];  Service: General;  Laterality: N/A;  ? COLONOSCOPY WITH PROPOFOL N/A 12/29/2017  ? Procedure: COLONOSCOPY WITH PROPOFOL;  Surgeon: [name], MD;  [facility];  Service: Endoscopy;  Laterality: N/A;  ? CORONARY ANGIOGRAM  2010  ? CORONARY ARTERY BYPASS GRAFT N/A 11/29/2014  ? Procedure: CORONARY ARTERY BYPASS GRAFTING x 5 (LIMA-LAD, SVG-D, SVG-OM1-OM2, SVG-PD);  Surgeon: Melrose Nakayama, MD;  Location: [facility];  Service: Open Heart Surgery;  Laterality: N/A;  ? CORONARY STENT INTERVENTION N/A 10/31/2016  ? Procedure: CORONARY STENT INTERVENTION;  Surgeon: Burnell Blanks, MD;  Location: [facility] CV LAB;  Service: Cardiovascular;  Laterality: N/A;  ? ESOPHAGOGASTRODUODENOSCOPY (EGD) WITH PROPOFOL N/A 12/29/2017  ? Procedure: ESOPHAGOGASTRODUODENOSCOPY (EGD) WITH PROPOFOL;  Surgeon: [name], MD;  [facility];  Service: Endoscopy;  Laterality: N/A;  ? FRACTURE SURGERY    ? INSERT / REPLACE / [place]  ? INTRAMEDULLARY (IM) NAIL INTERTROCHANTERIC Left 04/04/2021  ? Procedure: INTRAMEDULLARY (IM) NAIL INTERTROCHANTRIC;  Surgeon: Vanetta Mulders, MD;  Location: [facility];  Service: Orthopedics;  Laterality: Left;  ? IR FLUORO GUIDE CV LINE LEFT  04/11/2021  ? IR US GUIDE VASC ACCESS LEFT  04/11/2021  ? KNEE ARTHROSCOPY  Bilateral   ? LEFT HEART CATH AND CORS/GRAFTS ANGIOGRAPHY N/A 12/09/2017  ? Procedure: LEFT HEART CATH AND CORS/GRAFTS ANGIOGRAPHY;  Surgeon: Troy Sine, MD;  Location: [facility] CV LAB;  Service: Cardiovascular;  Laterality: N/A;  ? LEFT HEART CATHETERIZATION WITH CORONARY ANGIOGRAM N/A 07/23/2012  ? Procedure: LEFT HEART CATHETERIZATION WITH CORONARY ANGIOGRAM;  Surgeon: Leonie Man, MD;  Location: [facility] CATH LAB;  Service: Cardiovascular;  Laterality: N/A;  ? LEXISCAN MYOVIEW  11/14/2011  ? Mild-moderate defect seen in Mid Inferolateral and Mid Anterolateral regions-consistant w/ infarct/scar. No significant ischemia demonstrated.  ? POLYPECTOMY  12/29/2017  ? Procedure: POLYPECTOMY;  Surgeon: [name], MD;  Location: [facility];  Service: Endoscopy;;  ? PPM GENERATOR CHANGEOUT N/A 12/31/2020  ? Procedure: PPM GENERATOR CHANGEOUT;  Surgeon: Sanda Klein, MD;  [facility]  INVASIVE CV LAB;  Service: Cardiovascular;  Laterality: N/A;  ? RIGHT/LEFT HEART CATH AND CORONARY ANGIOGRAPHY N/A 10/09/2016  ? Procedure: RIGHT/LEFT HEART CATH AND CORONARY ANGIOGRAPHY;  Surgeon: Jolaine Artist, MD;  Location: [facility] CV LAB;  Service: Cardiovascular;  Laterality: N/A;  ? RIGHT/LEFT HEART CATH AND CORONARY/GRAFT ANGIOGRAPHY N/A 03/11/2019  ? Procedure: RIGHT/LEFT HEART CATH AND CORONARY/GRAFT ANGIOGRAPHY;  Surgeon: Jolaine Artist, MD;  Location: [facility] CV LAB;  Service: Cardiovascular;  Laterality: N/A;  ? TEE WITHOUT CARDIOVERSION N/A 11/29/2014  ? Procedure: TRANSESOPHAGEAL ECHOCARDIOGRAM (TEE);  Surgeon: Melrose Nakayama, MD;  Location: [facility];  Service: Open Heart Surgery;  Laterality: N/A;  ? TRANSCAROTID ARTERY REVASCULARIZATION?  Right 10/01/2020  ? Procedure: RIGHT TRANSCAROTID ARTERY REVASCULARIZATION;  Surgeon: [name], MD;  Location: [facility];  Service: Vascular;  Laterality: Right;  ? TRANSTHORACIC ECHOCARDIOGRAM  07/23/2012  ? EF 55-60%, normal-mild  ? TUBAL  LIGATION    ? ULTRASOUND GUIDANCE FOR VASCULAR ACCESS Left 10/01/2020  ? Procedure: ULTRASOUND GUIDANCE FOR VASCULAR ACCESS;  Surgeon: [name], MD;  Location: [facility];  Service: Vascular;  Laterality: Left;  ?

## 2021-05-04 DIAGNOSIS — Z23 Encounter for immunization: Secondary | ICD-10-CM | POA: Diagnosis not present

## 2021-05-04 DIAGNOSIS — N178 Other acute kidney failure: Secondary | ICD-10-CM | POA: Diagnosis not present

## 2021-05-04 DIAGNOSIS — D689 Coagulation defect, unspecified: Secondary | ICD-10-CM | POA: Diagnosis not present

## 2021-05-04 DIAGNOSIS — Z992 Dependence on renal dialysis: Secondary | ICD-10-CM | POA: Diagnosis not present

## 2021-05-06 ENCOUNTER — Other Ambulatory Visit (HOSPITAL_BASED_OUTPATIENT_CLINIC_OR_DEPARTMENT_OTHER): Payer: Self-pay | Admitting: Orthopaedic Surgery

## 2021-05-06 ENCOUNTER — Telehealth: Payer: Self-pay | Admitting: Orthopaedic Surgery

## 2021-05-06 DIAGNOSIS — K581 Irritable bowel syndrome with constipation: Secondary | ICD-10-CM | POA: Diagnosis not present

## 2021-05-06 DIAGNOSIS — Z992 Dependence on renal dialysis: Secondary | ICD-10-CM | POA: Diagnosis not present

## 2021-05-06 DIAGNOSIS — Z951 Presence of aortocoronary bypass graft: Secondary | ICD-10-CM | POA: Diagnosis not present

## 2021-05-06 DIAGNOSIS — E2740 Unspecified adrenocortical insufficiency: Secondary | ICD-10-CM | POA: Diagnosis not present

## 2021-05-06 DIAGNOSIS — G8929 Other chronic pain: Secondary | ICD-10-CM

## 2021-05-06 DIAGNOSIS — N1832 Chronic kidney disease, stage 3b: Secondary | ICD-10-CM | POA: Diagnosis not present

## 2021-05-06 DIAGNOSIS — E1121 Type 2 diabetes mellitus with diabetic nephropathy: Secondary | ICD-10-CM | POA: Diagnosis not present

## 2021-05-06 DIAGNOSIS — I4891 Unspecified atrial fibrillation: Secondary | ICD-10-CM | POA: Diagnosis not present

## 2021-05-06 DIAGNOSIS — D696 Thrombocytopenia, unspecified: Secondary | ICD-10-CM | POA: Diagnosis not present

## 2021-05-06 DIAGNOSIS — J9612 Chronic respiratory failure with hypercapnia: Secondary | ICD-10-CM | POA: Diagnosis not present

## 2021-05-06 DIAGNOSIS — J9611 Chronic respiratory failure with hypoxia: Secondary | ICD-10-CM | POA: Diagnosis not present

## 2021-05-06 DIAGNOSIS — Z950 Presence of cardiac pacemaker: Secondary | ICD-10-CM | POA: Diagnosis not present

## 2021-05-06 DIAGNOSIS — M19072 Primary osteoarthritis, left ankle and foot: Secondary | ICD-10-CM | POA: Diagnosis not present

## 2021-05-06 DIAGNOSIS — Z8673 Personal history of transient ischemic attack (TIA), and cerebral infarction without residual deficits: Secondary | ICD-10-CM | POA: Diagnosis not present

## 2021-05-06 DIAGNOSIS — J42 Unspecified chronic bronchitis: Secondary | ICD-10-CM | POA: Diagnosis not present

## 2021-05-06 DIAGNOSIS — I5022 Chronic systolic (congestive) heart failure: Secondary | ICD-10-CM | POA: Diagnosis not present

## 2021-05-06 NOTE — Telephone Encounter (Signed)
I called patient's son, [name], and advised. He would like to know what he needs to tell his mom to do in the meantime because she is having so much pain.  Her next follow up with Dr. Sammuel Hines is scheduled for the end of April. ? ? ?Please call [name] and advise. ?

## 2021-05-06 NOTE — Telephone Encounter (Signed)
Pt's son [name] called about referral sent from Dr. Sammuel Hines for pt to see Dr. Lorin Mercy. Looks that referral is closed. Unsure. Please call pt's son [name] at [phone]. ?

## 2021-05-06 NOTE — Telephone Encounter (Signed)
Can you advise? I believe you and Dr. Sammuel Hines spoke about not doing kyphoplasty.  Do you still want to see patient? ?

## 2021-05-09 DIAGNOSIS — N186 End stage renal disease: Secondary | ICD-10-CM | POA: Diagnosis not present

## 2021-05-09 DIAGNOSIS — N178 Other acute kidney failure: Secondary | ICD-10-CM | POA: Diagnosis not present

## 2021-05-09 DIAGNOSIS — D689 Coagulation defect, unspecified: Secondary | ICD-10-CM | POA: Diagnosis not present

## 2021-05-09 DIAGNOSIS — N179 Acute kidney failure, unspecified: Secondary | ICD-10-CM | POA: Diagnosis not present

## 2021-05-09 DIAGNOSIS — Z23 Encounter for immunization: Secondary | ICD-10-CM | POA: Diagnosis not present

## 2021-05-09 DIAGNOSIS — Z992 Dependence on renal dialysis: Secondary | ICD-10-CM | POA: Diagnosis not present

## 2021-05-11 DIAGNOSIS — R52 Pain, unspecified: Secondary | ICD-10-CM | POA: Diagnosis not present

## 2021-05-11 DIAGNOSIS — D689 Coagulation defect, unspecified: Secondary | ICD-10-CM | POA: Diagnosis not present

## 2021-05-11 DIAGNOSIS — N178 Other acute kidney failure: Secondary | ICD-10-CM | POA: Diagnosis not present

## 2021-05-11 DIAGNOSIS — N183 Chronic kidney disease, stage 3 unspecified: Secondary | ICD-10-CM | POA: Diagnosis not present

## 2021-05-11 DIAGNOSIS — Z992 Dependence on renal dialysis: Secondary | ICD-10-CM | POA: Diagnosis not present

## 2021-05-11 DIAGNOSIS — D631 Anemia in chronic kidney disease: Secondary | ICD-10-CM | POA: Diagnosis not present

## 2021-05-11 DIAGNOSIS — T8249XA Other complication of vascular dialysis catheter, initial encounter: Secondary | ICD-10-CM | POA: Diagnosis not present

## 2021-05-13 DIAGNOSIS — L89153 Pressure ulcer of sacral region, stage 3: Secondary | ICD-10-CM | POA: Diagnosis not present

## 2021-05-13 DIAGNOSIS — S72142D Displaced intertrochanteric fracture of left femur, subsequent encounter for closed fracture with routine healing: Secondary | ICD-10-CM | POA: Diagnosis not present

## 2021-05-13 DIAGNOSIS — S32512D Fracture of superior rim of left pubis, subsequent encounter for fracture with routine healing: Secondary | ICD-10-CM | POA: Diagnosis not present

## 2021-05-13 DIAGNOSIS — J9621 Acute and chronic respiratory failure with hypoxia: Secondary | ICD-10-CM | POA: Diagnosis not present

## 2021-05-14 DIAGNOSIS — Z992 Dependence on renal dialysis: Secondary | ICD-10-CM | POA: Diagnosis not present

## 2021-05-14 DIAGNOSIS — T8249XA Other complication of vascular dialysis catheter, initial encounter: Secondary | ICD-10-CM | POA: Diagnosis not present

## 2021-05-14 DIAGNOSIS — R52 Pain, unspecified: Secondary | ICD-10-CM | POA: Diagnosis not present

## 2021-05-14 DIAGNOSIS — D631 Anemia in chronic kidney disease: Secondary | ICD-10-CM | POA: Diagnosis not present

## 2021-05-14 DIAGNOSIS — N178 Other acute kidney failure: Secondary | ICD-10-CM | POA: Diagnosis not present

## 2021-05-14 DIAGNOSIS — N179 Acute kidney failure, unspecified: Secondary | ICD-10-CM | POA: Diagnosis not present

## 2021-05-14 DIAGNOSIS — N183 Chronic kidney disease, stage 3 unspecified: Secondary | ICD-10-CM | POA: Diagnosis not present

## 2021-05-14 DIAGNOSIS — D689 Coagulation defect, unspecified: Secondary | ICD-10-CM | POA: Diagnosis not present

## 2021-05-15 ENCOUNTER — Other Ambulatory Visit: Payer: Self-pay | Admitting: Cardiovascular Disease

## 2021-05-15 DIAGNOSIS — D631 Anemia in chronic kidney disease: Secondary | ICD-10-CM | POA: Diagnosis not present

## 2021-05-15 DIAGNOSIS — S72142D Displaced intertrochanteric fracture of left femur, subsequent encounter for closed fracture with routine healing: Secondary | ICD-10-CM | POA: Diagnosis not present

## 2021-05-15 DIAGNOSIS — I429 Cardiomyopathy, unspecified: Secondary | ICD-10-CM | POA: Diagnosis not present

## 2021-05-15 DIAGNOSIS — M454 Ankylosing spondylitis of thoracic region: Secondary | ICD-10-CM | POA: Diagnosis not present

## 2021-05-15 DIAGNOSIS — J810 Acute pulmonary edema: Secondary | ICD-10-CM | POA: Diagnosis not present

## 2021-05-15 DIAGNOSIS — I442 Atrioventricular block, complete: Secondary | ICD-10-CM | POA: Diagnosis not present

## 2021-05-15 DIAGNOSIS — J9621 Acute and chronic respiratory failure with hypoxia: Secondary | ICD-10-CM | POA: Diagnosis not present

## 2021-05-15 DIAGNOSIS — J9622 Acute and chronic respiratory failure with hypercapnia: Secondary | ICD-10-CM | POA: Diagnosis not present

## 2021-05-15 DIAGNOSIS — N179 Acute kidney failure, unspecified: Secondary | ICD-10-CM | POA: Diagnosis not present

## 2021-05-15 DIAGNOSIS — I25118 Atherosclerotic heart disease of native coronary artery with other forms of angina pectoris: Secondary | ICD-10-CM | POA: Diagnosis not present

## 2021-05-15 DIAGNOSIS — J309 Allergic rhinitis, unspecified: Secondary | ICD-10-CM | POA: Diagnosis not present

## 2021-05-15 DIAGNOSIS — I517 Cardiomegaly: Secondary | ICD-10-CM | POA: Diagnosis not present

## 2021-05-16 DIAGNOSIS — R52 Pain, unspecified: Secondary | ICD-10-CM | POA: Diagnosis not present

## 2021-05-16 DIAGNOSIS — N183 Chronic kidney disease, stage 3 unspecified: Secondary | ICD-10-CM | POA: Diagnosis not present

## 2021-05-16 DIAGNOSIS — T8249XA Other complication of vascular dialysis catheter, initial encounter: Secondary | ICD-10-CM | POA: Diagnosis not present

## 2021-05-16 DIAGNOSIS — Z992 Dependence on renal dialysis: Secondary | ICD-10-CM | POA: Diagnosis not present

## 2021-05-16 DIAGNOSIS — N178 Other acute kidney failure: Secondary | ICD-10-CM | POA: Diagnosis not present

## 2021-05-16 DIAGNOSIS — D689 Coagulation defect, unspecified: Secondary | ICD-10-CM | POA: Diagnosis not present

## 2021-05-16 DIAGNOSIS — D631 Anemia in chronic kidney disease: Secondary | ICD-10-CM | POA: Diagnosis not present

## 2021-05-17 DIAGNOSIS — S72142D Displaced intertrochanteric fracture of left femur, subsequent encounter for closed fracture with routine healing: Secondary | ICD-10-CM | POA: Diagnosis not present

## 2021-05-18 DIAGNOSIS — N183 Chronic kidney disease, stage 3 unspecified: Secondary | ICD-10-CM | POA: Diagnosis not present

## 2021-05-18 DIAGNOSIS — D689 Coagulation defect, unspecified: Secondary | ICD-10-CM | POA: Diagnosis not present

## 2021-05-18 DIAGNOSIS — R52 Pain, unspecified: Secondary | ICD-10-CM | POA: Diagnosis not present

## 2021-05-18 DIAGNOSIS — N178 Other acute kidney failure: Secondary | ICD-10-CM | POA: Diagnosis not present

## 2021-05-18 DIAGNOSIS — T8249XA Other complication of vascular dialysis catheter, initial encounter: Secondary | ICD-10-CM | POA: Diagnosis not present

## 2021-05-18 DIAGNOSIS — D631 Anemia in chronic kidney disease: Secondary | ICD-10-CM | POA: Diagnosis not present

## 2021-05-18 DIAGNOSIS — Z992 Dependence on renal dialysis: Secondary | ICD-10-CM | POA: Diagnosis not present

## 2021-05-20 DIAGNOSIS — I429 Cardiomyopathy, unspecified: Secondary | ICD-10-CM | POA: Diagnosis not present

## 2021-05-20 DIAGNOSIS — J9622 Acute and chronic respiratory failure with hypercapnia: Secondary | ICD-10-CM | POA: Diagnosis not present

## 2021-05-20 DIAGNOSIS — M454 Ankylosing spondylitis of thoracic region: Secondary | ICD-10-CM | POA: Diagnosis not present

## 2021-05-20 DIAGNOSIS — J810 Acute pulmonary edema: Secondary | ICD-10-CM | POA: Diagnosis not present

## 2021-05-20 DIAGNOSIS — I442 Atrioventricular block, complete: Secondary | ICD-10-CM | POA: Diagnosis not present

## 2021-05-20 DIAGNOSIS — I25118 Atherosclerotic heart disease of native coronary artery with other forms of angina pectoris: Secondary | ICD-10-CM | POA: Diagnosis not present

## 2021-05-20 DIAGNOSIS — N179 Acute kidney failure, unspecified: Secondary | ICD-10-CM | POA: Diagnosis not present

## 2021-05-20 DIAGNOSIS — S72142D Displaced intertrochanteric fracture of left femur, subsequent encounter for closed fracture with routine healing: Secondary | ICD-10-CM | POA: Diagnosis not present

## 2021-05-20 DIAGNOSIS — J9621 Acute and chronic respiratory failure with hypoxia: Secondary | ICD-10-CM | POA: Diagnosis not present

## 2021-05-20 DIAGNOSIS — D631 Anemia in chronic kidney disease: Secondary | ICD-10-CM | POA: Diagnosis not present

## 2021-05-20 DIAGNOSIS — I517 Cardiomegaly: Secondary | ICD-10-CM | POA: Diagnosis not present

## 2021-05-20 DIAGNOSIS — J309 Allergic rhinitis, unspecified: Secondary | ICD-10-CM | POA: Diagnosis not present

## 2021-05-21 DIAGNOSIS — I517 Cardiomegaly: Secondary | ICD-10-CM | POA: Diagnosis not present

## 2021-05-21 DIAGNOSIS — D689 Coagulation defect, unspecified: Secondary | ICD-10-CM | POA: Diagnosis not present

## 2021-05-21 DIAGNOSIS — N178 Other acute kidney failure: Secondary | ICD-10-CM | POA: Diagnosis not present

## 2021-05-21 DIAGNOSIS — R52 Pain, unspecified: Secondary | ICD-10-CM | POA: Diagnosis not present

## 2021-05-21 DIAGNOSIS — J9622 Acute and chronic respiratory failure with hypercapnia: Secondary | ICD-10-CM | POA: Diagnosis not present

## 2021-05-21 DIAGNOSIS — D631 Anemia in chronic kidney disease: Secondary | ICD-10-CM | POA: Diagnosis not present

## 2021-05-21 DIAGNOSIS — N183 Chronic kidney disease, stage 3 unspecified: Secondary | ICD-10-CM | POA: Diagnosis not present

## 2021-05-21 DIAGNOSIS — Z992 Dependence on renal dialysis: Secondary | ICD-10-CM | POA: Diagnosis not present

## 2021-05-21 DIAGNOSIS — N179 Acute kidney failure, unspecified: Secondary | ICD-10-CM | POA: Diagnosis not present

## 2021-05-21 DIAGNOSIS — J309 Allergic rhinitis, unspecified: Secondary | ICD-10-CM | POA: Diagnosis not present

## 2021-05-21 DIAGNOSIS — I429 Cardiomyopathy, unspecified: Secondary | ICD-10-CM | POA: Diagnosis not present

## 2021-05-21 DIAGNOSIS — I442 Atrioventricular block, complete: Secondary | ICD-10-CM | POA: Diagnosis not present

## 2021-05-21 DIAGNOSIS — J9621 Acute and chronic respiratory failure with hypoxia: Secondary | ICD-10-CM | POA: Diagnosis not present

## 2021-05-21 DIAGNOSIS — J810 Acute pulmonary edema: Secondary | ICD-10-CM | POA: Diagnosis not present

## 2021-05-21 DIAGNOSIS — T8249XA Other complication of vascular dialysis catheter, initial encounter: Secondary | ICD-10-CM | POA: Diagnosis not present

## 2021-05-21 DIAGNOSIS — I5032 Chronic diastolic (congestive) heart failure: Secondary | ICD-10-CM | POA: Diagnosis not present

## 2021-05-21 DIAGNOSIS — M454 Ankylosing spondylitis of thoracic region: Secondary | ICD-10-CM | POA: Diagnosis not present

## 2021-05-21 DIAGNOSIS — I25118 Atherosclerotic heart disease of native coronary artery with other forms of angina pectoris: Secondary | ICD-10-CM | POA: Diagnosis not present

## 2021-05-22 DIAGNOSIS — J9621 Acute and chronic respiratory failure with hypoxia: Secondary | ICD-10-CM | POA: Diagnosis not present

## 2021-05-22 DIAGNOSIS — S32512D Fracture of superior rim of left pubis, subsequent encounter for fracture with routine healing: Secondary | ICD-10-CM | POA: Diagnosis not present

## 2021-05-22 DIAGNOSIS — L89153 Pressure ulcer of sacral region, stage 3: Secondary | ICD-10-CM | POA: Diagnosis not present

## 2021-05-22 DIAGNOSIS — Z950 Presence of cardiac pacemaker: Secondary | ICD-10-CM | POA: Diagnosis not present

## 2021-05-22 DIAGNOSIS — D696 Thrombocytopenia, unspecified: Secondary | ICD-10-CM | POA: Diagnosis not present

## 2021-05-22 DIAGNOSIS — E1121 Type 2 diabetes mellitus with diabetic nephropathy: Secondary | ICD-10-CM | POA: Diagnosis not present

## 2021-05-22 DIAGNOSIS — G8929 Other chronic pain: Secondary | ICD-10-CM | POA: Diagnosis not present

## 2021-05-22 DIAGNOSIS — I4891 Unspecified atrial fibrillation: Secondary | ICD-10-CM | POA: Diagnosis not present

## 2021-05-22 DIAGNOSIS — E2740 Unspecified adrenocortical insufficiency: Secondary | ICD-10-CM | POA: Diagnosis not present

## 2021-05-22 DIAGNOSIS — Z8673 Personal history of transient ischemic attack (TIA), and cerebral infarction without residual deficits: Secondary | ICD-10-CM | POA: Diagnosis not present

## 2021-05-22 DIAGNOSIS — I9589 Other hypotension: Secondary | ICD-10-CM | POA: Diagnosis not present

## 2021-05-22 DIAGNOSIS — Z951 Presence of aortocoronary bypass graft: Secondary | ICD-10-CM | POA: Diagnosis not present

## 2021-05-22 DIAGNOSIS — M19072 Primary osteoarthritis, left ankle and foot: Secondary | ICD-10-CM | POA: Diagnosis not present

## 2021-05-22 DIAGNOSIS — L299 Pruritus, unspecified: Secondary | ICD-10-CM | POA: Diagnosis not present

## 2021-05-22 DIAGNOSIS — M7989 Other specified soft tissue disorders: Secondary | ICD-10-CM | POA: Diagnosis not present

## 2021-05-22 DIAGNOSIS — J42 Unspecified chronic bronchitis: Secondary | ICD-10-CM | POA: Diagnosis not present

## 2021-05-23 ENCOUNTER — Telehealth: Payer: Self-pay | Admitting: Orthopaedic Surgery

## 2021-05-23 DIAGNOSIS — D689 Coagulation defect, unspecified: Secondary | ICD-10-CM | POA: Diagnosis not present

## 2021-05-23 DIAGNOSIS — R52 Pain, unspecified: Secondary | ICD-10-CM | POA: Diagnosis not present

## 2021-05-23 DIAGNOSIS — N183 Chronic kidney disease, stage 3 unspecified: Secondary | ICD-10-CM | POA: Diagnosis not present

## 2021-05-23 DIAGNOSIS — D631 Anemia in chronic kidney disease: Secondary | ICD-10-CM | POA: Diagnosis not present

## 2021-05-23 DIAGNOSIS — Z992 Dependence on renal dialysis: Secondary | ICD-10-CM | POA: Diagnosis not present

## 2021-05-23 DIAGNOSIS — N179 Acute kidney failure, unspecified: Secondary | ICD-10-CM | POA: Diagnosis not present

## 2021-05-23 DIAGNOSIS — N178 Other acute kidney failure: Secondary | ICD-10-CM | POA: Diagnosis not present

## 2021-05-23 DIAGNOSIS — T8249XA Other complication of vascular dialysis catheter, initial encounter: Secondary | ICD-10-CM | POA: Diagnosis not present

## 2021-05-23 NOTE — Telephone Encounter (Signed)
[name] from [facility] called with a question about a referral sent to her. She states referral for office assessment but says see Dr. Lorin Mercy. Please call to clarify referral at [phone] extension [phone] ?

## 2021-05-24 DIAGNOSIS — Z951 Presence of aortocoronary bypass graft: Secondary | ICD-10-CM | POA: Diagnosis not present

## 2021-05-24 DIAGNOSIS — Z8673 Personal history of transient ischemic attack (TIA), and cerebral infarction without residual deficits: Secondary | ICD-10-CM | POA: Diagnosis not present

## 2021-05-24 DIAGNOSIS — L299 Pruritus, unspecified: Secondary | ICD-10-CM | POA: Diagnosis not present

## 2021-05-24 DIAGNOSIS — I4891 Unspecified atrial fibrillation: Secondary | ICD-10-CM | POA: Diagnosis not present

## 2021-05-24 DIAGNOSIS — E1121 Type 2 diabetes mellitus with diabetic nephropathy: Secondary | ICD-10-CM | POA: Diagnosis not present

## 2021-05-24 DIAGNOSIS — E2740 Unspecified adrenocortical insufficiency: Secondary | ICD-10-CM | POA: Diagnosis not present

## 2021-05-24 DIAGNOSIS — M19072 Primary osteoarthritis, left ankle and foot: Secondary | ICD-10-CM | POA: Diagnosis not present

## 2021-05-24 DIAGNOSIS — K649 Unspecified hemorrhoids: Secondary | ICD-10-CM | POA: Diagnosis not present

## 2021-05-24 DIAGNOSIS — D696 Thrombocytopenia, unspecified: Secondary | ICD-10-CM | POA: Diagnosis not present

## 2021-05-24 DIAGNOSIS — J42 Unspecified chronic bronchitis: Secondary | ICD-10-CM | POA: Diagnosis not present

## 2021-05-24 DIAGNOSIS — Z950 Presence of cardiac pacemaker: Secondary | ICD-10-CM | POA: Diagnosis not present

## 2021-05-24 NOTE — Telephone Encounter (Signed)
Spoke to [place] with [facility] and confirmed the referral was meant for their office.  ?

## 2021-05-24 NOTE — Telephone Encounter (Signed)
LMOM for [name] to call back for referral explanation 05/24/21 ?

## 2021-05-24 NOTE — Progress Notes (Signed)
? ? ?05/27/2021 ?[name] ?[id number] ?January 26, 1946 ? ?Referring provider: Lujean Amel, MD ?Primary GI doctor: Dr. Silverio Decamp ? ?ASSESSMENT AND PLAN:  ? ?Abdominal pain, epigastric ?Chronic history with CT abdomen pelvis February for recent hospitalization without intra-abdominal pathology, some vascular mesenteric ultrasound less than 70% SMA. ?Continue PPI BID, carafate, and add back on lidocaine patches ?? Part of constipation with being on norco?  Possible component of gastroparesis with narcotic use.,  Possible muscular component since also was in her back and patient does have known arthritis. ?Small frequent meals. ? ?Constipation, unspecified constipation type ?Increase Linzess to 290 since patient continues on hydrocodone with decreased movement. ?Continue to increase movement, monitor fluids. ?Some fecal incontinence but urinary incontinence as well, possible pelvic floor issues contributing. ?Add on fiber to help prevent fecal incontinence. ?We will try to get KUB at rehab facility ? ?Irritable bowel syndrome with constipation ?Increase Linzess to 290 ? ?Morbid obesity with body mass index (BMI) greater than or equal to 50 [facility]) ?Continue with possible weight loss. ? ? ?History of Present Illness:  ?76 y.o. female with a past medical history of hypertension, morbid obesity, diabetes, hyperlipidemia, OSA, CKD stage III, CAD status post CABG 2016, CVA secondary to high-grade ICA stenosis status post stent August 2022, CHF status post AICD, COPD with chronic oxygen use, chronic antiplatelet therapy with Plavix and aspirin. and others listed below, presents for hospital follow up.  ?Patient was in the hospital s/p ORIF for closed left hip fracture s/p fall February 23, complicated by acute renal failure, with complaints of worsening abdominal pain. ?CT abdomen pelvis without contrast that was negative for any acute intra-abdominal pathology ?Vascular mesenteric ultrasound  with less than 70% stenosis of  SMA ?Patient likely worsening from constipation from slow transit: Secondary to narcotics.  Patient placed on improved bowel regiment.   ?On Linzess 145 daily after bowel purge. ?States she is off her protonix.  ?Here for follow-up. ? ?She states she has very bad hemorrhoids.  ?She has also been treated for bed sore from sitting so much, this is healing.  ?She is at [facility] rehab center with goal to walk and get to her sons house.  ?She is still getting dialysis, if it continues to improve may be able to decrease from 2 x a week down to 3 x a week.  ?She has congestion and coughing, states from fluid and she is on 3-4 L of oxygen.  ?She is off her pantoprazole per patient, uncertain which medications she is actually getting.  ?She is in a diaper, she will have BM's 3-4 x a day.  ?Will feel she has to have gas and will have small volume loose BM's.  ?She is in wheel chair, still on hydrocodone.  ?Will have BM come out with moving around and have having urinary incontinence and stool incontinence.  ?She has history of chronic AB pain, states continues. Start in upper back and goes into her stomach. She is waring large caged back brace. Has not been doing the lidocaine patches which helped before.  ? ? ?Previous GI work up: ?12/05/2020 barium swallow showed stricture in distal esophagus similar to comparison exam however barium tablet passed GE junction, findings suggestive of reflux induced stricture of the distal esophagus.  No mucosal irregularities. ?  ?Barium esophagram November 04, 2019: Mild press by esophagus with good primary peristaltic wave.  Smooth narrowing of distal esophagus preventing passage of 13 mm tablet ?  ?EGD and colonoscopy in November 2019.  EGD revealed grade B esophagitis, small hiatal hernia, gastritis, and a small nodule in the stomach.  Gastric biopsies showed no H. pylori.  Showed reactive gastropathy with ulceration.  Colonoscopy revealed polyps that were removed and were tubular  adenomas.  Also had diverticulosis and internal/external hemorrhoids. ?  ?EGD December 14, 2006 showed mild antral gastritis otherwise normal exam.  H. pylori CLOtest negative. ?  ?Colonoscopy September 27, 2002 by Dr. Olevia Perches with removal of small diminutive hyperplastic polyps and left-sided diverticulosis ? ?Current Medications:  ? ?Current Outpatient Medications (Endocrine & Metabolic):  ?  empagliflozin (JARDIANCE) 10 MG TABS tablet, Take 10 mg by mouth daily before breakfast. ?  predniSONE (DELTASONE) 10 MG tablet, Take 10 mg by mouth daily with breakfast. ? ?Current Outpatient Medications (Cardiovascular):  ?  EPINEPHrine 0.3 mg/0.3 mL IJ SOAJ injection, Inject 0.3 mg into the muscle once as needed for anaphylaxis (severe allergic reaction). ?  isosorbide mononitrate (IMDUR) 30 MG 24 hr tablet, TAKE 3 TABLETS BY MOUTH  DAILY ?  midodrine (PROAMATINE) 10 MG tablet, Take 1 tablet (10 mg total) by mouth 3 (three) times daily with meals. ?  nitroGLYCERIN (NITROSTAT) 0.4 MG SL tablet, Place 1 tablet (0.4 mg total) under the tongue every 5 (five) minutes as needed for chest pain. ?  rosuvastatin (CRESTOR) 40 MG tablet, Take 1 tablet (40 mg total) by mouth every evening. NEED OV. ? ?Current Outpatient Medications (Respiratory):  ?  albuterol (PROVENTIL HFA;VENTOLIN HFA) 108 (90 Base) MCG/ACT inhaler, Inhale 2 puffs into the lungs every 6 (six) hours as needed for wheezing or shortness of breath. ?  albuterol (PROVENTIL) (2.5 MG/3ML) 0.083% nebulizer solution, Take 3 mLs (2.5 mg total) by nebulization every 6 (six) hours as needed for wheezing or shortness of breath. ?  budesonide-formoterol (SYMBICORT) 160-4.5 MCG/ACT inhaler, Inhale 2 puffs into the lungs 2 (two) times daily. ?  guaiFENesin (MUCINEX) 600 MG 12 hr tablet, Take 1 tablet (600 mg total) by mouth 2 (two) times daily. (Patient taking differently: Take 600 mg by mouth 2 (two) times daily as needed for cough.) ?  oxymetazoline (AFRIN) 0.05 % nasal spray, Place  1 spray into both nostrils 2 (two) times daily as needed for congestion. ? ?Current Outpatient Medications (Analgesics):  ?  acetaminophen (TYLENOL) 500 MG tablet, Take 1,000 mg by mouth 2 (two) times daily as needed (for pain or headaches). ?  allopurinol (ZYLOPRIM) 100 MG tablet, Take 1 tablet (100 mg total) by mouth daily. ?  aspirin EC 81 MG tablet, Take 1 tablet (81 mg total) by mouth daily. ?  HYDROcodone-acetaminophen (NORCO/VICODIN) 5-325 MG tablet, Take 1-2 tablets by mouth every 6 (six) hours as needed for moderate pain. ? ?Current Outpatient Medications (Hematological):  ?  clopidogrel (PLAVIX) 75 MG tablet, Take 1 tablet (75 mg total) by mouth daily. ? ?Current Outpatient Medications (Other):  ?  ACCU-CHEK AVIVA PLUS test strip, Check blood sugar twice daily ?  Accu-Chek Softclix Lancets lancets, Check blood sugar twice daily ?  Alcohol Swabs (B-D SINGLE USE SWABS REGULAR) PADS,  ?  carboxymethylcellulose (REFRESH PLUS) 0.5 % SOLN, Place 1 drop into both eyes every 12 (twelve) hours. ?  docusate sodium (COLACE) 100 MG capsule, Take 100 mg by mouth at bedtime. ?  hydrocortisone cream (PREPARATION H) 1 %, Apply 1 application. topically every 6 (six) hours as needed for itching. ?  linaclotide (LINZESS) 145 MCG CAPS capsule, Take 1 capsule (145 mcg total) by mouth daily before breakfast. ?  OXYGEN, Inhale  3 L/min into the lungs continuous.  ?  pantoprazole (PROTONIX) 40 MG tablet, Take 1 tablet (40 mg total) by mouth 2 (two) times daily. ?  POLYETHYLENE GLYCOL 3350 PO, Take 17 g by mouth in the morning and at bedtime. ?  RESTASIS 0.05 % ophthalmic emulsion, Place 1 drop into both eyes 2 (two) times daily. ?  senna-docusate (SENOKOT-S) 8.6-50 MG tablet, Take 1 tablet by mouth 2 (two) times daily. ?  simethicone (MYLICON) 80 MG chewable tablet, Chew 1 tablet (80 mg total) by mouth every 6 (six) hours as needed for flatulence. ?  sucralfate (CARAFATE) 1 g tablet, Take 1 tablet (1 g total) by mouth 4 (four)  times daily -  with meals and at bedtime. ? ?Surgical History:  ?She  has a past surgical history that includes Appendectomy; Tubal ligation; Coronary angiogram (2010); ABDOMINAL ULTRASOUND (12/01/2011); LEXI

## 2021-05-27 ENCOUNTER — Encounter: Payer: Self-pay | Admitting: Physician Assistant

## 2021-05-27 ENCOUNTER — Ambulatory Visit (INDEPENDENT_AMBULATORY_CARE_PROVIDER_SITE_OTHER): Payer: Medicare Other | Admitting: Physician Assistant

## 2021-05-27 ENCOUNTER — Telehealth (HOSPITAL_BASED_OUTPATIENT_CLINIC_OR_DEPARTMENT_OTHER): Payer: Self-pay | Admitting: Orthopaedic Surgery

## 2021-05-27 VITALS — BP 130/70 | HR 96

## 2021-05-27 DIAGNOSIS — K5900 Constipation, unspecified: Secondary | ICD-10-CM

## 2021-05-27 DIAGNOSIS — K581 Irritable bowel syndrome with constipation: Secondary | ICD-10-CM | POA: Diagnosis not present

## 2021-05-27 DIAGNOSIS — R1013 Epigastric pain: Secondary | ICD-10-CM

## 2021-05-27 MED ORDER — LINACLOTIDE 290 MCG PO CAPS: 290.0000 ug | ORAL_CAPSULE | Freq: Every day | ORAL | 0 refills | Status: DC

## 2021-05-27 NOTE — Patient Instructions (Addendum)
Continue medications ?Get KUB to evaluate stool burden.  ?Can increase linzess from 145 to 290 ?Add on fiber to help with stool incontinence ?Get on lidocaine patches ? ?Follow up 8 weeks Dr. Janey Greaser.  ? ? ?

## 2021-05-27 NOTE — Telephone Encounter (Signed)
Referral note ?

## 2021-05-28 DIAGNOSIS — J42 Unspecified chronic bronchitis: Secondary | ICD-10-CM | POA: Diagnosis not present

## 2021-05-28 DIAGNOSIS — S32512D Fracture of superior rim of left pubis, subsequent encounter for fracture with routine healing: Secondary | ICD-10-CM | POA: Diagnosis not present

## 2021-05-28 DIAGNOSIS — N178 Other acute kidney failure: Secondary | ICD-10-CM | POA: Diagnosis not present

## 2021-05-28 DIAGNOSIS — D631 Anemia in chronic kidney disease: Secondary | ICD-10-CM | POA: Diagnosis not present

## 2021-05-28 DIAGNOSIS — T8249XA Other complication of vascular dialysis catheter, initial encounter: Secondary | ICD-10-CM | POA: Diagnosis not present

## 2021-05-28 DIAGNOSIS — J9621 Acute and chronic respiratory failure with hypoxia: Secondary | ICD-10-CM | POA: Diagnosis not present

## 2021-05-28 DIAGNOSIS — R52 Pain, unspecified: Secondary | ICD-10-CM | POA: Diagnosis not present

## 2021-05-28 DIAGNOSIS — N183 Chronic kidney disease, stage 3 unspecified: Secondary | ICD-10-CM | POA: Diagnosis not present

## 2021-05-28 DIAGNOSIS — Z992 Dependence on renal dialysis: Secondary | ICD-10-CM | POA: Diagnosis not present

## 2021-05-28 DIAGNOSIS — L89153 Pressure ulcer of sacral region, stage 3: Secondary | ICD-10-CM | POA: Diagnosis not present

## 2021-05-28 DIAGNOSIS — D689 Coagulation defect, unspecified: Secondary | ICD-10-CM | POA: Diagnosis not present

## 2021-05-29 DIAGNOSIS — J9621 Acute and chronic respiratory failure with hypoxia: Secondary | ICD-10-CM | POA: Diagnosis not present

## 2021-05-29 DIAGNOSIS — D631 Anemia in chronic kidney disease: Secondary | ICD-10-CM | POA: Diagnosis not present

## 2021-05-29 DIAGNOSIS — N179 Acute kidney failure, unspecified: Secondary | ICD-10-CM | POA: Diagnosis not present

## 2021-05-29 DIAGNOSIS — J309 Allergic rhinitis, unspecified: Secondary | ICD-10-CM | POA: Diagnosis not present

## 2021-05-29 DIAGNOSIS — J810 Acute pulmonary edema: Secondary | ICD-10-CM | POA: Diagnosis not present

## 2021-05-29 DIAGNOSIS — M454 Ankylosing spondylitis of thoracic region: Secondary | ICD-10-CM | POA: Diagnosis not present

## 2021-05-29 DIAGNOSIS — I517 Cardiomegaly: Secondary | ICD-10-CM | POA: Diagnosis not present

## 2021-05-29 DIAGNOSIS — J9622 Acute and chronic respiratory failure with hypercapnia: Secondary | ICD-10-CM | POA: Diagnosis not present

## 2021-05-29 DIAGNOSIS — K581 Irritable bowel syndrome with constipation: Secondary | ICD-10-CM | POA: Diagnosis not present

## 2021-05-29 DIAGNOSIS — S72142D Displaced intertrochanteric fracture of left femur, subsequent encounter for closed fracture with routine healing: Secondary | ICD-10-CM | POA: Diagnosis not present

## 2021-05-29 DIAGNOSIS — I25118 Atherosclerotic heart disease of native coronary artery with other forms of angina pectoris: Secondary | ICD-10-CM | POA: Diagnosis not present

## 2021-05-29 DIAGNOSIS — I442 Atrioventricular block, complete: Secondary | ICD-10-CM | POA: Diagnosis not present

## 2021-05-30 DIAGNOSIS — I25118 Atherosclerotic heart disease of native coronary artery with other forms of angina pectoris: Secondary | ICD-10-CM | POA: Diagnosis not present

## 2021-05-30 DIAGNOSIS — N178 Other acute kidney failure: Secondary | ICD-10-CM | POA: Diagnosis not present

## 2021-05-30 DIAGNOSIS — Z992 Dependence on renal dialysis: Secondary | ICD-10-CM | POA: Diagnosis not present

## 2021-05-30 DIAGNOSIS — M454 Ankylosing spondylitis of thoracic region: Secondary | ICD-10-CM | POA: Diagnosis not present

## 2021-05-30 DIAGNOSIS — D689 Coagulation defect, unspecified: Secondary | ICD-10-CM | POA: Diagnosis not present

## 2021-05-30 DIAGNOSIS — T8249XA Other complication of vascular dialysis catheter, initial encounter: Secondary | ICD-10-CM | POA: Diagnosis not present

## 2021-05-30 DIAGNOSIS — N179 Acute kidney failure, unspecified: Secondary | ICD-10-CM | POA: Diagnosis not present

## 2021-05-30 DIAGNOSIS — J9622 Acute and chronic respiratory failure with hypercapnia: Secondary | ICD-10-CM | POA: Diagnosis not present

## 2021-05-30 DIAGNOSIS — J810 Acute pulmonary edema: Secondary | ICD-10-CM | POA: Diagnosis not present

## 2021-05-30 DIAGNOSIS — N183 Chronic kidney disease, stage 3 unspecified: Secondary | ICD-10-CM | POA: Diagnosis not present

## 2021-05-30 DIAGNOSIS — I442 Atrioventricular block, complete: Secondary | ICD-10-CM | POA: Diagnosis not present

## 2021-05-30 DIAGNOSIS — I429 Cardiomyopathy, unspecified: Secondary | ICD-10-CM | POA: Diagnosis not present

## 2021-05-30 DIAGNOSIS — J9621 Acute and chronic respiratory failure with hypoxia: Secondary | ICD-10-CM | POA: Diagnosis not present

## 2021-05-30 DIAGNOSIS — J309 Allergic rhinitis, unspecified: Secondary | ICD-10-CM | POA: Diagnosis not present

## 2021-05-30 DIAGNOSIS — I517 Cardiomegaly: Secondary | ICD-10-CM | POA: Diagnosis not present

## 2021-05-30 DIAGNOSIS — D631 Anemia in chronic kidney disease: Secondary | ICD-10-CM | POA: Diagnosis not present

## 2021-05-30 DIAGNOSIS — R52 Pain, unspecified: Secondary | ICD-10-CM | POA: Diagnosis not present

## 2021-05-30 DIAGNOSIS — S72142D Displaced intertrochanteric fracture of left femur, subsequent encounter for closed fracture with routine healing: Secondary | ICD-10-CM | POA: Diagnosis not present

## 2021-06-01 DIAGNOSIS — Z992 Dependence on renal dialysis: Secondary | ICD-10-CM | POA: Diagnosis not present

## 2021-06-01 DIAGNOSIS — D631 Anemia in chronic kidney disease: Secondary | ICD-10-CM | POA: Diagnosis not present

## 2021-06-01 DIAGNOSIS — N183 Chronic kidney disease, stage 3 unspecified: Secondary | ICD-10-CM | POA: Diagnosis not present

## 2021-06-01 DIAGNOSIS — R52 Pain, unspecified: Secondary | ICD-10-CM | POA: Diagnosis not present

## 2021-06-01 DIAGNOSIS — N178 Other acute kidney failure: Secondary | ICD-10-CM | POA: Diagnosis not present

## 2021-06-01 DIAGNOSIS — T8249XA Other complication of vascular dialysis catheter, initial encounter: Secondary | ICD-10-CM | POA: Diagnosis not present

## 2021-06-01 DIAGNOSIS — D689 Coagulation defect, unspecified: Secondary | ICD-10-CM | POA: Diagnosis not present

## 2021-06-03 DIAGNOSIS — J810 Acute pulmonary edema: Secondary | ICD-10-CM | POA: Diagnosis not present

## 2021-06-03 DIAGNOSIS — I442 Atrioventricular block, complete: Secondary | ICD-10-CM | POA: Diagnosis not present

## 2021-06-03 DIAGNOSIS — I517 Cardiomegaly: Secondary | ICD-10-CM | POA: Diagnosis not present

## 2021-06-03 DIAGNOSIS — M454 Ankylosing spondylitis of thoracic region: Secondary | ICD-10-CM | POA: Diagnosis not present

## 2021-06-03 DIAGNOSIS — N179 Acute kidney failure, unspecified: Secondary | ICD-10-CM | POA: Diagnosis not present

## 2021-06-03 DIAGNOSIS — D631 Anemia in chronic kidney disease: Secondary | ICD-10-CM | POA: Diagnosis not present

## 2021-06-03 DIAGNOSIS — I5032 Chronic diastolic (congestive) heart failure: Secondary | ICD-10-CM | POA: Diagnosis not present

## 2021-06-03 DIAGNOSIS — I25118 Atherosclerotic heart disease of native coronary artery with other forms of angina pectoris: Secondary | ICD-10-CM | POA: Diagnosis not present

## 2021-06-03 DIAGNOSIS — J9622 Acute and chronic respiratory failure with hypercapnia: Secondary | ICD-10-CM | POA: Diagnosis not present

## 2021-06-03 DIAGNOSIS — S72142D Displaced intertrochanteric fracture of left femur, subsequent encounter for closed fracture with routine healing: Secondary | ICD-10-CM | POA: Diagnosis not present

## 2021-06-03 DIAGNOSIS — J9621 Acute and chronic respiratory failure with hypoxia: Secondary | ICD-10-CM | POA: Diagnosis not present

## 2021-06-03 DIAGNOSIS — I429 Cardiomyopathy, unspecified: Secondary | ICD-10-CM | POA: Diagnosis not present

## 2021-06-04 DIAGNOSIS — J42 Unspecified chronic bronchitis: Secondary | ICD-10-CM | POA: Diagnosis not present

## 2021-06-04 DIAGNOSIS — J9621 Acute and chronic respiratory failure with hypoxia: Secondary | ICD-10-CM | POA: Diagnosis not present

## 2021-06-04 DIAGNOSIS — Z992 Dependence on renal dialysis: Secondary | ICD-10-CM | POA: Diagnosis not present

## 2021-06-04 DIAGNOSIS — S32512D Fracture of superior rim of left pubis, subsequent encounter for fracture with routine healing: Secondary | ICD-10-CM | POA: Diagnosis not present

## 2021-06-04 DIAGNOSIS — D689 Coagulation defect, unspecified: Secondary | ICD-10-CM | POA: Diagnosis not present

## 2021-06-04 DIAGNOSIS — R52 Pain, unspecified: Secondary | ICD-10-CM | POA: Diagnosis not present

## 2021-06-04 DIAGNOSIS — N183 Chronic kidney disease, stage 3 unspecified: Secondary | ICD-10-CM | POA: Diagnosis not present

## 2021-06-04 DIAGNOSIS — N178 Other acute kidney failure: Secondary | ICD-10-CM | POA: Diagnosis not present

## 2021-06-04 DIAGNOSIS — T8249XA Other complication of vascular dialysis catheter, initial encounter: Secondary | ICD-10-CM | POA: Diagnosis not present

## 2021-06-04 DIAGNOSIS — D631 Anemia in chronic kidney disease: Secondary | ICD-10-CM | POA: Diagnosis not present

## 2021-06-04 DIAGNOSIS — L89153 Pressure ulcer of sacral region, stage 3: Secondary | ICD-10-CM | POA: Diagnosis not present

## 2021-06-06 DIAGNOSIS — I5032 Chronic diastolic (congestive) heart failure: Secondary | ICD-10-CM | POA: Diagnosis not present

## 2021-06-06 DIAGNOSIS — S72142D Displaced intertrochanteric fracture of left femur, subsequent encounter for closed fracture with routine healing: Secondary | ICD-10-CM | POA: Diagnosis not present

## 2021-06-06 DIAGNOSIS — D631 Anemia in chronic kidney disease: Secondary | ICD-10-CM | POA: Diagnosis not present

## 2021-06-06 DIAGNOSIS — I25118 Atherosclerotic heart disease of native coronary artery with other forms of angina pectoris: Secondary | ICD-10-CM | POA: Diagnosis not present

## 2021-06-06 DIAGNOSIS — I442 Atrioventricular block, complete: Secondary | ICD-10-CM | POA: Diagnosis not present

## 2021-06-06 DIAGNOSIS — J810 Acute pulmonary edema: Secondary | ICD-10-CM | POA: Diagnosis not present

## 2021-06-06 DIAGNOSIS — J9612 Chronic respiratory failure with hypercapnia: Secondary | ICD-10-CM | POA: Diagnosis not present

## 2021-06-06 DIAGNOSIS — I517 Cardiomegaly: Secondary | ICD-10-CM | POA: Diagnosis not present

## 2021-06-06 DIAGNOSIS — N179 Acute kidney failure, unspecified: Secondary | ICD-10-CM | POA: Diagnosis not present

## 2021-06-06 DIAGNOSIS — M454 Ankylosing spondylitis of thoracic region: Secondary | ICD-10-CM | POA: Diagnosis not present

## 2021-06-06 DIAGNOSIS — J9611 Chronic respiratory failure with hypoxia: Secondary | ICD-10-CM | POA: Diagnosis not present

## 2021-06-06 DIAGNOSIS — I5022 Chronic systolic (congestive) heart failure: Secondary | ICD-10-CM | POA: Diagnosis not present

## 2021-06-06 DIAGNOSIS — J9622 Acute and chronic respiratory failure with hypercapnia: Secondary | ICD-10-CM | POA: Diagnosis not present

## 2021-06-06 DIAGNOSIS — J9621 Acute and chronic respiratory failure with hypoxia: Secondary | ICD-10-CM | POA: Diagnosis not present

## 2021-06-06 DIAGNOSIS — I429 Cardiomyopathy, unspecified: Secondary | ICD-10-CM | POA: Diagnosis not present

## 2021-06-07 ENCOUNTER — Ambulatory Visit (INDEPENDENT_AMBULATORY_CARE_PROVIDER_SITE_OTHER): Payer: Medicare Other | Admitting: Orthopaedic Surgery

## 2021-06-07 DIAGNOSIS — I25118 Atherosclerotic heart disease of native coronary artery with other forms of angina pectoris: Secondary | ICD-10-CM | POA: Diagnosis not present

## 2021-06-07 DIAGNOSIS — N179 Acute kidney failure, unspecified: Secondary | ICD-10-CM | POA: Diagnosis not present

## 2021-06-07 DIAGNOSIS — I517 Cardiomegaly: Secondary | ICD-10-CM | POA: Diagnosis not present

## 2021-06-07 DIAGNOSIS — J309 Allergic rhinitis, unspecified: Secondary | ICD-10-CM | POA: Diagnosis not present

## 2021-06-07 DIAGNOSIS — J810 Acute pulmonary edema: Secondary | ICD-10-CM | POA: Diagnosis not present

## 2021-06-07 DIAGNOSIS — J9621 Acute and chronic respiratory failure with hypoxia: Secondary | ICD-10-CM | POA: Diagnosis not present

## 2021-06-07 DIAGNOSIS — J9622 Acute and chronic respiratory failure with hypercapnia: Secondary | ICD-10-CM | POA: Diagnosis not present

## 2021-06-07 DIAGNOSIS — S72142D Displaced intertrochanteric fracture of left femur, subsequent encounter for closed fracture with routine healing: Secondary | ICD-10-CM | POA: Diagnosis not present

## 2021-06-07 DIAGNOSIS — S72002D Fracture of unspecified part of neck of left femur, subsequent encounter for closed fracture with routine healing: Secondary | ICD-10-CM

## 2021-06-07 DIAGNOSIS — I429 Cardiomyopathy, unspecified: Secondary | ICD-10-CM | POA: Diagnosis not present

## 2021-06-07 DIAGNOSIS — I442 Atrioventricular block, complete: Secondary | ICD-10-CM | POA: Diagnosis not present

## 2021-06-07 DIAGNOSIS — M454 Ankylosing spondylitis of thoracic region: Secondary | ICD-10-CM | POA: Diagnosis not present

## 2021-06-07 DIAGNOSIS — D631 Anemia in chronic kidney disease: Secondary | ICD-10-CM | POA: Diagnosis not present

## 2021-06-07 NOTE — Progress Notes (Signed)
? ?                            ? ? ?Post Operative Evaluation ?  ? ?Procedure/Date of Surgery: Cephalomedullary nail left hip none April 04, 2021 ? ?Interval History:  ? ?Presents today for follow-up of her left hip cephalomedullary nail.  Overall she is doing well.  She continues to weight-bear with a walker although this could progressing.  She is now on dialysis only twice per week.  She continues to struggle in terms of finding a possible surgeon for her thoracic compression issue.  She is awaiting ? ? ?PMH/PSH/Family History/Social History/Meds/Allergies:   ? ?Past Medical History:  ?Diagnosis Date  ? ABDOMINAL PAIN -GENERALIZED 02/21/2010  ? Qualifier: Diagnosis of  By: Chester Holstein NP, Nevin Bloodgood    ? AICD (automatic cardioverter/defibrillator) present   ? downgraded to CRT-P in 2014  ? Anemia   ? Arthritis   ? Asthma   ? Atrial fibrillation ([place]) 10/24/2015  ? Questionable history of A Fib/A Flutter. On device interrogation on 9/7, showed 0.53mn of A Fib/A Flutter with 1% burden.  Device check in Jan 2018 showed no sustained AF (runs of less than 30 seconds). No anticoagulation indicated.  ? CAD (coronary artery disease)   ? a. 10/09/16 LHC: SVG->LAD patent, SVG->Diag patent, SVG->RCA patent, SVG->LCx occluded. EF 60%, b. 10/31/16 LHC DES to AV groove Circ, DES to intermed branch  ? Cellulitis and abscess of foot 12/2014  ? RT FOOT  ? CHF (congestive heart failure) ([place]   ? Chronic bronchitis ([place]   ? Chronic lower back pain   ? Complete heart block ([place] 06/22/2015  ? Diverticulosis   ? Facial numbness 02/2016  ? Fatty liver disease, nonalcoholic   ? Gastritis   ? GERD (gastroesophageal reflux disease)   ? Gout   ? H/O hiatal hernia   ? HEMORRHOIDS-EXTERNAL 02/21/2010  ? Qualifier: Diagnosis of  By: GChester HolsteinNP, PNevin Bloodgood   ? High cholesterol   ? Hyperplastic colon polyp   ? Hypertension   ? IBS (irritable bowel syndrome)   ? Internal hemorrhoids   ? INTERNAL HEMORRHOIDS WITHOUT MENTION COMP 04/12/2007  ?  Qualifier: Diagnosis of  By: BOlevia PerchesMD, DLowella Bandy  ? Nonischemic cardiomyopathy ([place] 11/22/2011  ? Pt responded to BiV ICD- last EF 55-60% Sept 2017  ? Obesity   ? On home oxygen therapy   ? "2L at night" (10/31/2016)  ? OSA (obstructive sleep apnea)   ? "can't tolerate a mask" (10/30/2016)  ? PERSONAL HX COLONIC POLYPS 02/21/2010  ? Qualifier: Diagnosis of  By: GChester HolsteinNP, PNevin Bloodgood   ? Pneumonia   ? "couple times" (10/31/2016)  ? Presence of permanent cardiac pacemaker   ? 11/02/12 [employer] V273 INTUA PPM  ? Right facial numbness 03/05/2016  ? Shortness of breath   ? Stroke ([facility] 09/2020  ? TIA (transient ischemic attack)   ? "recently" (10/31/2016)  ? Type II diabetes mellitus ([place]   ? ?Past Surgical History:  ?Procedure Laterality Date  ? ABDOMINAL ULTRASOUND  12/01/2011  ? Peripelvic cysts- #1- 2.4x1.9x2.3cm, #2-1.2x0.9x1.2cm  ? ANKLE FRACTURE SURGERY Right   ? "had rod put in"  ? ANTERIOR CERVICAL DECOMP/DISCECTOMY FUSION    ? APPENDECTOMY    ? BACK SURGERY    ? BIOPSY  12/29/2017  ? Procedure: BIOPSY;  Surgeon: NMauri Pole MD;  [facility]: [facility];  Service: Endoscopy;;  ? BIV  ICD INSERTION CRT-D  2001?  ? BIV PACEMAKER GENERATOR CHANGE OUT N/A 11/02/2012  ? Procedure: BIV PACEMAKER GENERATOR CHANGE OUT;  Surgeon: Sanda Klein, MD;  Location: [place] CATH LAB;  Service: Cardiovascular;  Laterality: N/A;  ? CARDIAC CATHETERIZATION  05/17/1999  ? No significant coronary obstructive disease w/ mild 20% luminal irregularity of the first diag branch of the LAD  ? CARDIAC CATHETERIZATION  07/08/2002  ? No significant CAD, moderately depressed LV systolic function  ? CARDIAC CATHETERIZATION Bilateral 04/26/2007  ? Normal findings, recommend medical therapy  ? CARDIAC CATHETERIZATION  02/18/2008  ? Moderate CAD, would benefit from having a functional study, recommend continue medical therapy  ? CARDIAC CATHETERIZATION  07/23/2012  ? Medical therapy  ? CARDIAC CATHETERIZATION N/A 11/24/2014  ? Procedure: Left Heart  Cath and Coronary Angiography;  Surgeon: Troy Sine, MD;  Location: [place] CV LAB;  Service: Cardiovascular;  Laterality: N/A;  ? CARDIAC CATHETERIZATION  11/27/2014  ? Procedure: Intravascular Pressure Wire/FFR Study;  Surgeon: Peter M Martinique, MD;  Location: [place] CV LAB;  Service: Cardiovascular;;  ? CARDIAC CATHETERIZATION  10/09/2016  ? CHOLECYSTECTOMY N/A 04/09/2018  ? Procedure: LAPAROSCOPIC CHOLECYSTECTOMY;  Surgeon: Greer Pickerel, MD;  [facility];  Service: General;  Laterality: N/A;  ? COLONOSCOPY WITH PROPOFOL N/A 12/29/2017  ? Procedure: COLONOSCOPY WITH PROPOFOL;  Surgeon: [name], MD;  [facility];  Service: Endoscopy;  Laterality: N/A;  ? CORONARY ANGIOGRAM  2010  ? CORONARY ARTERY BYPASS GRAFT N/A 11/29/2014  ? Procedure: CORONARY ARTERY BYPASS GRAFTING x 5 (LIMA-LAD, SVG-D, SVG-OM1-OM2, SVG-PD);  Surgeon: Melrose Nakayama, MD;  Location: [place];  Service: Open Heart Surgery;  Laterality: N/A;  ? CORONARY STENT INTERVENTION N/A 10/31/2016  ? Procedure: CORONARY STENT INTERVENTION;  Surgeon: Burnell Blanks, MD;  Location: [place] CV LAB;  Service: Cardiovascular;  Laterality: N/A;  ? ESOPHAGOGASTRODUODENOSCOPY (EGD) WITH PROPOFOL N/A 12/29/2017  ? Procedure: ESOPHAGOGASTRODUODENOSCOPY (EGD) WITH PROPOFOL;  Surgeon: [name], MD;  [facility];  Service: Endoscopy;  Laterality: N/A;  ? FRACTURE SURGERY    ? INSERT / REPLACE / [place]  ? INTRAMEDULLARY (IM) NAIL INTERTROCHANTERIC Left 04/04/2021  ? Procedure: INTRAMEDULLARY (IM) NAIL INTERTROCHANTRIC;  Surgeon: Vanetta Mulders, MD;  Location: [place];  Service: Orthopedics;  Laterality: Left;  ? IR FLUORO GUIDE CV LINE LEFT  04/11/2021  ? IR US GUIDE VASC ACCESS LEFT  04/11/2021  ? KNEE ARTHROSCOPY Bilateral   ? LEFT HEART CATH AND CORS/GRAFTS ANGIOGRAPHY N/A 12/09/2017  ? Procedure: LEFT HEART CATH AND CORS/GRAFTS ANGIOGRAPHY;  Surgeon: Troy Sine, MD;  Location:  [place] CV LAB;  Service: Cardiovascular;  Laterality: N/A;  ? LEFT HEART CATHETERIZATION WITH CORONARY ANGIOGRAM N/A 07/23/2012  ? Procedure: LEFT HEART CATHETERIZATION WITH CORONARY ANGIOGRAM;  Surgeon: Leonie Man, MD;  Location: [facility] CATH LAB;  Service: Cardiovascular;  Laterality: N/A;  ? LEXISCAN MYOVIEW  11/14/2011  ? Mild-moderate defect seen in Mid Inferolateral and Mid Anterolateral regions-consistant w/ infarct/scar. No significant ischemia demonstrated.  ? POLYPECTOMY  12/29/2017  ? Procedure: POLYPECTOMY;  Surgeon: [name], MD;  Location: [facility];  Service: Endoscopy;;  ? PPM GENERATOR CHANGEOUT N/A 12/31/2020  ? Procedure: PPM GENERATOR CHANGEOUT;  Surgeon: Sanda Klein, MD;  Location: [place] CV LAB;  Service: Cardiovascular;  Laterality: N/A;  ? RIGHT/LEFT HEART CATH AND CORONARY ANGIOGRAPHY N/A 10/09/2016  ? Procedure: RIGHT/LEFT HEART CATH AND CORONARY ANGIOGRAPHY;  Surgeon: Jolaine Artist, MD;  Location: [place] CV LAB;  Service: Cardiovascular;  Laterality: N/A;  ? RIGHT/LEFT HEART CATH AND CORONARY/GRAFT ANGIOGRAPHY N/A 03/11/2019  ? Procedure: RIGHT/LEFT HEART CATH AND CORONARY/GRAFT ANGIOGRAPHY;  Surgeon: Jolaine Artist, MD;  Location: [place] CV LAB;  Service: Cardiovascular;  Laterality: N/A;  ? TEE WITHOUT CARDIOVERSION N/A 11/29/2014  ? Procedure: TRANSESOPHAGEAL ECHOCARDIOGRAM (TEE);  Surgeon: Melrose Nakayama, MD;  Location: [place];  Service: Open Heart Surgery;  Laterality: N/A;  ? TRANSCAROTID ARTERY REVASCULARIZATION?  Right 10/01/2020  ? Procedure: RIGHT TRANSCAROTID ARTERY REVASCULARIZATION;  Surgeon: [name], MD;  Location: [place];  Service: Vascular;  Laterality: Right;  ? TRANSTHORACIC ECHOCARDIOGRAM  07/23/2012  ? EF 55-60%, normal-mild  ? TUBAL LIGATION    ? ULTRASOUND GUIDANCE FOR VASCULAR ACCESS Left 10/01/2020  ? Procedure: ULTRASOUND GUIDANCE FOR VASCULAR ACCESS;  Surgeon: [name], MD;  Location: [place];   Service: Vascular;  Laterality: Left;  ? ?Social History  ? ?Socioeconomic History  ? Marital status: Widowed  ?  Spouse name: Emme Rosenau- deceased  ? Number of children: 5  ? Years of education: No

## 2021-06-08 DIAGNOSIS — N183 Chronic kidney disease, stage 3 unspecified: Secondary | ICD-10-CM | POA: Diagnosis not present

## 2021-06-08 DIAGNOSIS — N178 Other acute kidney failure: Secondary | ICD-10-CM | POA: Diagnosis not present

## 2021-06-08 DIAGNOSIS — R52 Pain, unspecified: Secondary | ICD-10-CM | POA: Diagnosis not present

## 2021-06-08 DIAGNOSIS — Z992 Dependence on renal dialysis: Secondary | ICD-10-CM | POA: Diagnosis not present

## 2021-06-08 DIAGNOSIS — D689 Coagulation defect, unspecified: Secondary | ICD-10-CM | POA: Diagnosis not present

## 2021-06-08 DIAGNOSIS — D631 Anemia in chronic kidney disease: Secondary | ICD-10-CM | POA: Diagnosis not present

## 2021-06-08 DIAGNOSIS — T8249XA Other complication of vascular dialysis catheter, initial encounter: Secondary | ICD-10-CM | POA: Diagnosis not present

## 2021-06-09 DIAGNOSIS — I442 Atrioventricular block, complete: Secondary | ICD-10-CM | POA: Diagnosis not present

## 2021-06-09 DIAGNOSIS — E1122 Type 2 diabetes mellitus with diabetic chronic kidney disease: Secondary | ICD-10-CM | POA: Diagnosis not present

## 2021-06-09 DIAGNOSIS — E782 Mixed hyperlipidemia: Secondary | ICD-10-CM | POA: Diagnosis not present

## 2021-06-09 DIAGNOSIS — M454 Ankylosing spondylitis of thoracic region: Secondary | ICD-10-CM | POA: Diagnosis not present

## 2021-06-09 DIAGNOSIS — I429 Cardiomyopathy, unspecified: Secondary | ICD-10-CM | POA: Diagnosis not present

## 2021-06-09 DIAGNOSIS — I2510 Atherosclerotic heart disease of native coronary artery without angina pectoris: Secondary | ICD-10-CM | POA: Diagnosis not present

## 2021-06-09 DIAGNOSIS — N186 End stage renal disease: Secondary | ICD-10-CM | POA: Diagnosis not present

## 2021-06-09 DIAGNOSIS — G4700 Insomnia, unspecified: Secondary | ICD-10-CM | POA: Diagnosis not present

## 2021-06-09 DIAGNOSIS — D631 Anemia in chronic kidney disease: Secondary | ICD-10-CM | POA: Diagnosis not present

## 2021-06-09 DIAGNOSIS — E2740 Unspecified adrenocortical insufficiency: Secondary | ICD-10-CM | POA: Diagnosis not present

## 2021-06-09 DIAGNOSIS — G4733 Obstructive sleep apnea (adult) (pediatric): Secondary | ICD-10-CM | POA: Diagnosis not present

## 2021-06-09 DIAGNOSIS — J4520 Mild intermittent asthma, uncomplicated: Secondary | ICD-10-CM | POA: Diagnosis not present

## 2021-06-09 DIAGNOSIS — I9589 Other hypotension: Secondary | ICD-10-CM | POA: Diagnosis not present

## 2021-06-09 DIAGNOSIS — M7989 Other specified soft tissue disorders: Secondary | ICD-10-CM | POA: Diagnosis not present

## 2021-06-09 DIAGNOSIS — M1000 Idiopathic gout, unspecified site: Secondary | ICD-10-CM | POA: Diagnosis not present

## 2021-06-09 DIAGNOSIS — M19072 Primary osteoarthritis, left ankle and foot: Secondary | ICD-10-CM | POA: Diagnosis not present

## 2021-06-09 DIAGNOSIS — S72142D Displaced intertrochanteric fracture of left femur, subsequent encounter for closed fracture with routine healing: Secondary | ICD-10-CM | POA: Diagnosis not present

## 2021-06-09 DIAGNOSIS — J449 Chronic obstructive pulmonary disease, unspecified: Secondary | ICD-10-CM | POA: Diagnosis not present

## 2021-06-09 DIAGNOSIS — I132 Hypertensive heart and chronic kidney disease with heart failure and with stage 5 chronic kidney disease, or end stage renal disease: Secondary | ICD-10-CM | POA: Diagnosis not present

## 2021-06-09 DIAGNOSIS — J9621 Acute and chronic respiratory failure with hypoxia: Secondary | ICD-10-CM | POA: Diagnosis not present

## 2021-06-09 DIAGNOSIS — G8929 Other chronic pain: Secondary | ICD-10-CM | POA: Diagnosis not present

## 2021-06-09 DIAGNOSIS — I5032 Chronic diastolic (congestive) heart failure: Secondary | ICD-10-CM | POA: Diagnosis not present

## 2021-06-09 DIAGNOSIS — H04123 Dry eye syndrome of bilateral lacrimal glands: Secondary | ICD-10-CM | POA: Diagnosis not present

## 2021-06-09 DIAGNOSIS — I4891 Unspecified atrial fibrillation: Secondary | ICD-10-CM | POA: Diagnosis not present

## 2021-06-10 ENCOUNTER — Emergency Department (HOSPITAL_COMMUNITY): Payer: Medicare Other

## 2021-06-10 ENCOUNTER — Emergency Department (HOSPITAL_COMMUNITY)
Admission: EM | Admit: 2021-06-10 | Discharge: 2021-06-12 | Disposition: A | Discharge status: Home / Self Care | Payer: Medicare Other | Attending: Emergency Medicine | Admitting: Emergency Medicine

## 2021-06-10 ENCOUNTER — Other Ambulatory Visit: Payer: Self-pay

## 2021-06-10 DIAGNOSIS — Z7982 Long term (current) use of aspirin: Secondary | ICD-10-CM | POA: Diagnosis not present

## 2021-06-10 DIAGNOSIS — Z9981 Dependence on supplemental oxygen: Secondary | ICD-10-CM

## 2021-06-10 DIAGNOSIS — Z743 Need for continuous supervision: Secondary | ICD-10-CM | POA: Diagnosis not present

## 2021-06-10 DIAGNOSIS — I499 Cardiac arrhythmia, unspecified: Secondary | ICD-10-CM | POA: Diagnosis not present

## 2021-06-10 DIAGNOSIS — Z7902 Long term (current) use of antithrombotics/antiplatelets: Secondary | ICD-10-CM | POA: Diagnosis not present

## 2021-06-10 DIAGNOSIS — N186 End stage renal disease: Secondary | ICD-10-CM | POA: Diagnosis present

## 2021-06-10 DIAGNOSIS — M549 Dorsalgia, unspecified: Secondary | ICD-10-CM | POA: Diagnosis not present

## 2021-06-10 DIAGNOSIS — R5381 Other malaise: Secondary | ICD-10-CM | POA: Diagnosis present

## 2021-06-10 DIAGNOSIS — Z992 Dependence on renal dialysis: Secondary | ICD-10-CM | POA: Diagnosis present

## 2021-06-10 DIAGNOSIS — N289 Disorder of kidney and ureter, unspecified: Secondary | ICD-10-CM

## 2021-06-10 DIAGNOSIS — J9811 Atelectasis: Secondary | ICD-10-CM | POA: Diagnosis not present

## 2021-06-10 DIAGNOSIS — R2243 Localized swelling, mass and lump, lower limb, bilateral: Secondary | ICD-10-CM | POA: Diagnosis not present

## 2021-06-10 DIAGNOSIS — R609 Edema, unspecified: Secondary | ICD-10-CM | POA: Diagnosis not present

## 2021-06-10 DIAGNOSIS — R0602 Shortness of breath: Secondary | ICD-10-CM

## 2021-06-10 DIAGNOSIS — E8770 Fluid overload, unspecified: Secondary | ICD-10-CM | POA: Diagnosis not present

## 2021-06-10 LAB — CBC
HCT: 33.7 % — ABNORMAL LOW (ref 36.0–46.0)
Hemoglobin: 9.5 g/dL — ABNORMAL LOW (ref 12.0–15.0)
MCH: 31.6 pg (ref 26.0–34.0)
MCHC: 28.2 g/dL — ABNORMAL LOW (ref 30.0–36.0)
MCV: 112 fL — ABNORMAL HIGH (ref 80.0–100.0)
Platelets: 196 10*3/uL (ref 150–400)
RBC: 3.01 MIL/uL — ABNORMAL LOW (ref 3.87–5.11)
RDW: 15.9 % — ABNORMAL HIGH (ref 11.5–15.5)
WBC: 5.1 10*3/uL (ref 4.0–10.5)
nRBC: 0 % (ref 0.0–0.2)

## 2021-06-10 LAB — COMPREHENSIVE METABOLIC PANEL
ALT: 34 U/L (ref 0–44)
AST: 18 U/L (ref 15–41)
Albumin: 2.7 g/dL — ABNORMAL LOW (ref 3.5–5.0)
Alkaline Phosphatase: 253 U/L — ABNORMAL HIGH (ref 38–126)
Anion gap: 7 (ref 5–15)
BUN: 35 mg/dL — ABNORMAL HIGH (ref 8–23)
CO2: 29 mmol/L (ref 22–32)
Calcium: 10 mg/dL (ref 8.9–10.3)
Chloride: 102 mmol/L (ref 98–111)
Creatinine, Ser: 2.54 mg/dL — ABNORMAL HIGH (ref 0.44–1.00)
GFR, Estimated: 19 mL/min — ABNORMAL LOW (ref >60)
Glucose, Bld: 138 mg/dL — ABNORMAL HIGH (ref 70–99)
Potassium: 3.7 mmol/L (ref 3.5–5.1)
Sodium: 138 mmol/L (ref 135–145)
Total Bilirubin: 0.3 mg/dL (ref 0.3–1.2)
Total Protein: 5.9 g/dL — ABNORMAL LOW (ref 6.5–8.1)

## 2021-06-10 LAB — BRAIN NATRIURETIC PEPTIDE: B Natriuretic Peptide: 836.6 pg/mL — ABNORMAL HIGH (ref 0.0–100.0)

## 2021-06-10 LAB — TROPONIN I (HIGH SENSITIVITY): Troponin I (High Sensitivity): 35 ng/L — ABNORMAL HIGH (ref <18)

## 2021-06-10 NOTE — ED Triage Notes (Signed)
Pt via [facility] c/o SHOB onset Friday after dc from nursing home. At son's house, EMS states [facility] progressively gotten worse. Dialysis decreased to 2x/wk, Tues & Sat. Compliant w same & home meds. Per EMS, family states that nursing home "screwed up a lot of her stuff & meds." EMS advises multiple skin tears on R arm, bruising to abd, pt on thinners. 4L [place] ? ?VSS, NAD on arrival to triage. ?

## 2021-06-10 NOTE — ED Provider Triage Note (Signed)
Emergency Medicine Provider Triage Evaluation Note ? ?[name] , a 76 y.o. female  was evaluated in triage.  Pt complains of shortness of breath and weakness for the last 3 to 4 days.  Patient reports that she is dialysis patient, goes 2 days a week.  Patient states her last full dialysis session was on Saturday.  Patient states that since being moved out of a nursing home, she has been unable to receive medication for unknown reason.  Patient states that on Friday she was discharged from nursing home.  Patient has been at son's house since then.  Patient states that since being moved out of nursing home her shortness of breath has become worse.  Patient states that she is compliant with her home meds.  Patient has multiple skin tears on her right arm.  Patient was oxygen at baseline. ? ?Review of Systems  ?Positive:  ?Negative:  ? ?Physical Exam  ?There were no vitals taken for this visit. ?Gen:   Awake, no distress   ?Resp:  Normal effort  ?MSK:   Moves extremities without difficulty  ?Other:   ? ?Medical Decision Making  ?Medically screening exam initiated at 8:43 PM.  Appropriate orders placed.  [name] was informed that the remainder of the evaluation will be completed by another provider, this initial triage assessment does not replace that evaluation, and the importance of remaining in the ED until their evaluation is complete. ? ? ?  ?Azucena Cecil, PA-C ?06/10/21 2044 ? ?

## 2021-06-10 NOTE — ED Notes (Signed)
Oxygen tank replaced °

## 2021-06-11 ENCOUNTER — Emergency Department (HOSPITAL_COMMUNITY): Payer: Medicare Other

## 2021-06-11 ENCOUNTER — Encounter (HOSPITAL_COMMUNITY): Payer: Self-pay | Admitting: Internal Medicine

## 2021-06-11 DIAGNOSIS — N289 Disorder of kidney and ureter, unspecified: Secondary | ICD-10-CM | POA: Diagnosis not present

## 2021-06-11 DIAGNOSIS — E8770 Fluid overload, unspecified: Secondary | ICD-10-CM | POA: Diagnosis not present

## 2021-06-11 DIAGNOSIS — R0602 Shortness of breath: Secondary | ICD-10-CM | POA: Diagnosis not present

## 2021-06-11 DIAGNOSIS — R5381 Other malaise: Secondary | ICD-10-CM | POA: Diagnosis present

## 2021-06-11 DIAGNOSIS — J9811 Atelectasis: Secondary | ICD-10-CM | POA: Diagnosis not present

## 2021-06-11 DIAGNOSIS — Z7982 Long term (current) use of aspirin: Secondary | ICD-10-CM | POA: Diagnosis not present

## 2021-06-11 LAB — HEPATITIS B SURFACE ANTIGEN: Hepatitis B Surface Ag: NONREACTIVE

## 2021-06-11 LAB — CBC WITH DIFFERENTIAL/PLATELET
Abs Immature Granulocytes: 0.05 10*3/uL (ref 0.00–0.07)
Basophils Absolute: 0 10*3/uL (ref 0.0–0.1)
Basophils Relative: 1 %
Eosinophils Absolute: 0.1 10*3/uL (ref 0.0–0.5)
Eosinophils Relative: 2 %
HCT: 33.1 % — ABNORMAL LOW (ref 36.0–46.0)
Hemoglobin: 9.4 g/dL — ABNORMAL LOW (ref 12.0–15.0)
Immature Granulocytes: 1 %
Lymphocytes Relative: 30 %
Lymphs Abs: 1.6 10*3/uL (ref 0.7–4.0)
MCH: 31.8 pg (ref 26.0–34.0)
MCHC: 28.4 g/dL — ABNORMAL LOW (ref 30.0–36.0)
MCV: 111.8 fL — ABNORMAL HIGH (ref 80.0–100.0)
Monocytes Absolute: 0.5 10*3/uL (ref 0.1–1.0)
Monocytes Relative: 9 %
Neutro Abs: 3.1 10*3/uL (ref 1.7–7.7)
Neutrophils Relative %: 57 %
Platelets: 170 10*3/uL (ref 150–400)
RBC: 2.96 MIL/uL — ABNORMAL LOW (ref 3.87–5.11)
RDW: 16.1 % — ABNORMAL HIGH (ref 11.5–15.5)
WBC: 5.3 10*3/uL (ref 4.0–10.5)
nRBC: 0 % (ref 0.0–0.2)

## 2021-06-11 LAB — BASIC METABOLIC PANEL
Anion gap: 7 (ref 5–15)
BUN: 36 mg/dL — ABNORMAL HIGH (ref 8–23)
CO2: 30 mmol/L (ref 22–32)
Calcium: 10.3 mg/dL (ref 8.9–10.3)
Chloride: 104 mmol/L (ref 98–111)
Creatinine, Ser: 2.68 mg/dL — ABNORMAL HIGH (ref 0.44–1.00)
GFR, Estimated: 18 mL/min — ABNORMAL LOW (ref >60)
Glucose, Bld: 94 mg/dL (ref 70–99)
Potassium: 3.4 mmol/L — ABNORMAL LOW (ref 3.5–5.1)
Sodium: 141 mmol/L (ref 135–145)

## 2021-06-11 LAB — TROPONIN I (HIGH SENSITIVITY): Troponin I (High Sensitivity): 32 ng/L — ABNORMAL HIGH (ref <18)

## 2021-06-11 LAB — HEPATITIS B SURFACE ANTIBODY,QUALITATIVE: Hep B S Ab: REACTIVE — AB

## 2021-06-11 MED ORDER — CHLORHEXIDINE GLUCONATE CLOTH 2 % EX PADS: 6.0000 | MEDICATED_PAD | Freq: Every day | CUTANEOUS | Status: DC

## 2021-06-11 MED ORDER — HEPARIN SODIUM (PORCINE) 1000 UNIT/ML DIALYSIS
2000.0000 [IU] | INTRAMUSCULAR | Status: DC | PRN
  Filled 2021-06-11: qty 2

## 2021-06-11 MED ORDER — HEPARIN SODIUM (PORCINE) 1000 UNIT/ML DIALYSIS: 2000.0000 [IU] | Freq: Once | INTRAMUSCULAR | Status: DC

## 2021-06-11 MED ORDER — HEPARIN SODIUM (PORCINE) 1000 UNIT/ML IJ SOLN
INTRAMUSCULAR | Status: AC
  Administered 2021-06-11: 1000 [IU]
  Filled 2021-06-11: qty 5

## 2021-06-11 NOTE — Progress Notes (Signed)
Patient discharging home. [facility] reported that patient is in copay days and would have to pay out of pocket to come back. Family is requesting long term care. Patient would have to find a facility willing to take medicaid pending for her long term care to be approved. Patient has home health services in the home. Patient is being discharged from the ED due to not meeting criteria for admission.This was discussed with the family. Patients son and daughter in law stated they can care for patient but the only thing they can't do is bath and change her. Patients daughter in law stated she plans on hiring an aide or getting patient personal care services through her medicaid to come out to the home and assist with bathing and changing. Patients daughter in law stated her plan is to get patient into [facility] for long term care. Patients daughter in law told CSW to send her to their home at [address] in [place].  ?

## 2021-06-11 NOTE — ED Notes (Signed)
Patient oxygen tank replaced 

## 2021-06-11 NOTE — Progress Notes (Signed)
Pt receives out-pt HD at [facility]) on Tuesday and Saturday. Pt arrives at 11:15 for 11:35 chair time. Will assist as needed.  ? ?Melven Sartorius ?Renal Navigator ?[phone] ?

## 2021-06-11 NOTE — Consult Note (Signed)
?ER Consult ? ? ?Patient: AVERLEIGH SAVARY NOT:[id number] DOB: 03-18-1945 ?DOA: 06/10/2021 ?DOS: the patient was seen and examined on 06/11/2021 ?PCP: Lujean Amel, MD  ?Patient coming from: Home - lives with her son; NOK: [place], [place], [phone] ? ? ?Chief Complaint: SOB ? ?HPI: MONICKA CYRAN is a 76 y.o. female with medical history significant of chronic systolic CHF s/p AICD; afib; CAD; chronic pain; NASH; HLD; HTN; obesity; OSA on 3L chronic O2; CVA; DM; and ESRD on TS HD (2x/week) presenting with SOB.  She was recent discharged from SNF rehab and had medication changes as well as decreased HD frequency.  She does not have a fistula, has not met with vascular.  She had been seeing nephrology but was stable.  She fell in February and broke her L hip and her pelvis.  During her hospitalization, she transitioned to HD.  She was eventually discharged to [facility].  She got sick there with stomach and back issues but did not require rehospitalization.  She was discharged from to live with her son about a week ago.  He is trying to get her to [facility].  He is unable to take care of her - "he can't wipe my poop... it is too much stress on him."  He thought he would be able to care for her but it is too hard.  She noticed difficulty breathing 2 days ago.  She hasn't had anything to eat and is shaking.   She is also having back pain that started at the nursing home.  She saw her orthopedist and he said it was doing good but she hasn't had her usual pain pills. ? ? ? ?ER Course:  Hip fracture -> renal failure, TTS HD.  SOB, LE edema.  Currently on 4L [place] O2.  CXR last night and this AM with atelectasis, no edema.  BMP 836.  Spoke with nephrology.  Also kicked out of SNF last week, social issues, may need placement. ? ? ? ? ?Review of Systems: As mentioned in the history of present illness. All other systems reviewed and are negative. ?Past Medical History:  ?Diagnosis Date  ? AICD (automatic  cardioverter/defibrillator) present   ? downgraded to CRT-P in 2014  ? Anemia   ? Arthritis   ? Asthma   ? Atrial fibrillation ([place]) 10/24/2015  ? Questionable history of A Fib/A Flutter. On device interrogation on 9/7, showed 0.75mn of A Fib/A Flutter with 1% burden.  Device check in Jan 2018 showed no sustained AF (runs of less than 30 seconds). No anticoagulation indicated.  ? CAD (coronary artery disease)   ? a. 10/09/16 LHC: SVG->LAD patent, SVG->Diag patent, SVG->RCA patent, SVG->LCx occluded. EF 60%, b. 10/31/16 LHC DES to AV groove Circ, DES to intermed branch  ? CHF (congestive heart failure) ([place]   ? Chronic lower back pain   ? Fatty liver disease, nonalcoholic   ? GERD (gastroesophageal reflux disease)   ? Gout   ? H/O hiatal hernia   ? High cholesterol   ? Hyperplastic colon polyp   ? Hypertension   ? IBS (irritable bowel syndrome)   ? Internal hemorrhoids   ? INTERNAL HEMORRHOIDS WITHOUT MENTION COMP 04/12/2007  ? Qualifier: Diagnosis of  By: BOlevia PerchesMD, DLowella Bandy  ? Nonischemic cardiomyopathy ([place] 11/22/2011  ? Pt responded to BiV ICD- last EF 55-60% Sept 2017  ? Obesity   ? OSA (obstructive sleep apnea)   ? "can't tolerate a mask", wears 2L  nocturnal O2  ? Presence of permanent cardiac pacemaker   ? 11/02/12 [employer] V273 INTUA PPM  ? Stroke ([place]) 09/2020  ? Type II diabetes mellitus ([place])   ? ?Past Surgical History:  ?Procedure Laterality Date  ? ABDOMINAL ULTRASOUND  12/01/2011  ? Peripelvic cysts- #1- 2.4x1.9x2.3cm, #2-1.2x0.9x1.2cm  ? ANKLE FRACTURE SURGERY Right   ? "had rod put in"  ? ANTERIOR CERVICAL DECOMP/DISCECTOMY FUSION    ? APPENDECTOMY    ? BACK SURGERY    ? BIOPSY  12/29/2017  ? Procedure: BIOPSY;  Surgeon: [name], MD;  Location: [facility];  Service: Endoscopy;;  ? BIV ICD INSERTION CRT-D  2001?  ? BIV PACEMAKER GENERATOR CHANGE OUT N/A 11/02/2012  ? Procedure: BIV PACEMAKER GENERATOR CHANGE OUT;  Surgeon: Sanda Klein, MD;  Location: [place] [facility];  Service:  Cardiovascular;  Laterality: N/A;  ? CARDIAC CATHETERIZATION  05/17/1999  ? No significant coronary obstructive disease w/ mild 20% luminal irregularity of the first diag branch of the LAD  ? CARDIAC CATHETERIZATION  07/08/2002  ? No significant CAD, moderately depressed LV systolic function  ? CARDIAC CATHETERIZATION Bilateral 04/26/2007  ? Normal findings, recommend medical therapy  ? CARDIAC CATHETERIZATION  02/18/2008  ? Moderate CAD, would benefit from having a functional study, recommend continue medical therapy  ? CARDIAC CATHETERIZATION  07/23/2012  ? Medical therapy  ? CARDIAC CATHETERIZATION N/A 11/24/2014  ? Procedure: Left Heart Cath and Coronary Angiography;  Surgeon: Troy Sine, MD;  Location: [place] [facility];  Service: Cardiovascular;  Laterality: N/A;  ? CARDIAC CATHETERIZATION  11/27/2014  ? Procedure: Intravascular Pressure Wire/FFR Study;  Surgeon: Peter M Martinique, MD;  Location: [place] CV LAB;  Service: Cardiovascular;;  ? CARDIAC CATHETERIZATION  10/09/2016  ? CHOLECYSTECTOMY N/A 04/09/2018  ? Procedure: LAPAROSCOPIC CHOLECYSTECTOMY;  Surgeon: Greer Pickerel, MD;  [facility]: [facility];  Service: General;  Laterality: N/A;  ? COLONOSCOPY WITH PROPOFOL N/A 12/29/2017  ? Procedure: COLONOSCOPY WITH PROPOFOL;  Surgeon: [name], MD;  [facility];  Service: Endoscopy;  Laterality: N/A;  ? CORONARY ANGIOGRAM  2010  ? CORONARY ARTERY BYPASS GRAFT N/A 11/29/2014  ? Procedure: CORONARY ARTERY BYPASS GRAFTING x 5 (LIMA-LAD, SVG-D, SVG-OM1-OM2, SVG-PD);  Surgeon: Melrose Nakayama, MD;  Location: [place];  Service: Open Heart Surgery;  Laterality: N/A;  ? CORONARY STENT INTERVENTION N/A 10/31/2016  ? Procedure: CORONARY STENT INTERVENTION;  Surgeon: Burnell Blanks, MD;  Location: [place] CV LAB;  Service: Cardiovascular;  Laterality: N/A;  ? ESOPHAGOGASTRODUODENOSCOPY (EGD) WITH PROPOFOL N/A 12/29/2017  ? Procedure: ESOPHAGOGASTRODUODENOSCOPY (EGD) WITH PROPOFOL;  Surgeon:  [name], MD;  [facility];  Service: Endoscopy;  Laterality: N/A;  ? FRACTURE SURGERY    ? INSERT / REPLACE / [place]  ? INTRAMEDULLARY (IM) NAIL INTERTROCHANTERIC Left 04/04/2021  ? Procedure: INTRAMEDULLARY (IM) NAIL INTERTROCHANTRIC;  Surgeon: Vanetta Mulders, MD;  Location: [place];  Service: Orthopedics;  Laterality: Left;  ? IR FLUORO GUIDE CV LINE LEFT  04/11/2021  ? IR US GUIDE VASC ACCESS LEFT  04/11/2021  ? KNEE ARTHROSCOPY Bilateral   ? LEFT HEART CATH AND CORS/GRAFTS ANGIOGRAPHY N/A 12/09/2017  ? Procedure: LEFT HEART CATH AND CORS/GRAFTS ANGIOGRAPHY;  Surgeon: Troy Sine, MD;  Location: [place] CV LAB;  Service: Cardiovascular;  Laterality: N/A;  ? LEFT HEART CATHETERIZATION WITH CORONARY ANGIOGRAM N/A 07/23/2012  ? Procedure: LEFT HEART CATHETERIZATION WITH CORONARY ANGIOGRAM;  Surgeon: Leonie Man, MD;  Location:  [place];  Service: Cardiovascular;  Laterality: N/A;  ? LEXISCAN MYOVIEW  11/14/2011  ? Mild-moderate defect seen in Mid Inferolateral and Mid Anterolateral regions-consistant w/ infarct/scar. No significant ischemia demonstrated.  ? POLYPECTOMY  12/29/2017  ? Procedure: POLYPECTOMY;  Surgeon: [name], MD;  Location: [facility];  Service: Endoscopy;;  ? PPM GENERATOR CHANGEOUT N/A 12/31/2020  ? Procedure: PPM GENERATOR CHANGEOUT;  Surgeon: Sanda Klein, MD;  Location: [place] CV LAB;  Service: Cardiovascular;  Laterality: N/A;  ? RIGHT/LEFT HEART CATH AND CORONARY ANGIOGRAPHY N/A 10/09/2016  ? Procedure: RIGHT/LEFT HEART CATH AND CORONARY ANGIOGRAPHY;  Surgeon: Jolaine Artist, MD;  Location: [place] CV LAB;  Service: Cardiovascular;  Laterality: N/A;  ? RIGHT/LEFT HEART CATH AND CORONARY/GRAFT ANGIOGRAPHY N/A 03/11/2019  ? Procedure: RIGHT/LEFT HEART CATH AND CORONARY/GRAFT ANGIOGRAPHY;  Surgeon: Jolaine Artist, MD;  Location: [place] CV LAB;  Service: Cardiovascular;  Laterality: N/A;  ? TEE WITHOUT  CARDIOVERSION N/A 11/29/2014  ? Procedure: TRANSESOPHAGEAL ECHOCARDIOGRAM (TEE);  Surgeon: Melrose Nakayama, MD;  Location: [place];  Service: Open Heart Surgery;  Laterality: N/A;  ? TRANSCAROTID ARTERY REVASCULARIZATION?  [place]

## 2021-06-11 NOTE — ED Provider Notes (Addendum)
?[place] ?Provider Note ? ? ?CSN: [id number] ?Arrival date & time: 06/10/21  2012 ? ?  ? ?History ? ?Chief Complaint  ?Patient presents with  ? Shortness of Breath  ? ? ?[name] is a 76 y.o. female. ? ?76 year old female with prior medical history as detailed below presents for evaluation.  Patient reports that she is currently on dialysis.  Her last dialysis session was on Saturday.  Patient reports that she is due for dialysis today. ? ?She reports increasing shortness of breath.  She reports increased lower extremity edema.  Symptoms have become worse over the last 2 to 3 days. ? ?She apparently has been on dialysis for approximately 2 to 3 months. ? ?She denies chest pain or fever ? ?Additionally, patient reports that she had been in a nursing facility until sometime last week.  Apparently that time she was told that she had to move in with her son.  She reports that her son is unable to take care of her.  She has been advised by visiting nurse that she needs to find a better place to stay. ? ?The history is provided by the patient and medical records.  ?Shortness of Breath ?Severity:  Moderate ?Onset quality:  Gradual ?Duration:  3 days ?Timing:  Constant ?Progression:  Worsening ?Chronicity:  New ? ?  ? ?Home Medications ?Prior to Admission medications   ?Medication Sig Start Date End Date Taking? Authorizing Provider  ?ACCU-CHEK AVIVA PLUS test strip Check blood sugar twice daily 10/19/19   [provider]  ?Accu-Chek Softclix Lancets lancets Check blood sugar twice daily 08/10/19   [provider]  ?acetaminophen (TYLENOL) 500 MG tablet Take 1,000 mg by mouth 2 (two) times daily as needed (for pain or headaches).    [provider]  ?albuterol (PROVENTIL HFA;VENTOLIN HFA) 108 (90 Base) MCG/ACT inhaler Inhale 2 puffs into the lungs every 6 (six) hours as needed for wheezing or shortness of breath. 05/02/18   Norval Morton, MD   ?albuterol (PROVENTIL) (2.5 MG/3ML) 0.083% nebulizer solution Take 3 mLs (2.5 mg total) by nebulization every 6 (six) hours as needed for wheezing or shortness of breath. 05/02/18   Norval Morton, MD  ?Alcohol Swabs (B-D SINGLE USE SWABS REGULAR) PADS  08/10/19   [provider]  ?allopurinol (ZYLOPRIM) 100 MG tablet Take 1 tablet (100 mg total) by mouth daily. 03/14/21   Croitoru, [name], MD  ?aspirin EC 81 MG tablet Take 1 tablet (81 mg total) by mouth daily. 03/27/16   Erlene Quan, PA-C  ?budesonide-formoterol (SYMBICORT) 160-4.5 MCG/ACT inhaler Inhale 2 puffs into the lungs 2 (two) times daily.    [provider]  ?carboxymethylcellulose (REFRESH PLUS) 0.5 % SOLN Place 1 drop into both eyes every 12 (twelve) hours.    [provider]  ?clopidogrel (PLAVIX) 75 MG tablet Take 1 tablet (75 mg total) by mouth daily. 02/28/21   Croitoru, [name], MD  ?docusate sodium (COLACE) 100 MG capsule Take 100 mg by mouth at bedtime.    [provider]  ?empagliflozin (JARDIANCE) 10 MG TABS tablet Take 10 mg by mouth daily before breakfast. 06/30/19   Bensimhon, Shaune Pascal, MD  ?EPINEPHrine 0.3 mg/0.3 mL IJ SOAJ injection Inject 0.3 mg into the muscle once as needed for anaphylaxis (severe allergic reaction).    [provider]  ?guaiFENesin (MUCINEX) 600 MG 12 hr tablet Take 1 tablet (600 mg total) by mouth 2 (two) times daily. ?Patient taking  differently: Take 600 mg by mouth 2 (two) times daily as needed for cough. 01/10/21   [name], NP  ?HYDROcodone-acetaminophen (NORCO/VICODIN) 5-325 MG tablet Take 1-2 tablets by mouth every 6 (six) hours as needed for moderate pain. 04/28/21   Caren Griffins, MD  ?hydrocortisone cream (PREPARATION H) 1 % Apply 1 application. topically every 6 (six) hours as needed for itching.    [provider]  ?isosorbide mononitrate (IMDUR) 30 MG 24 hr tablet TAKE 3 TABLETS BY MOUTH  DAILY 05/15/21   Croitoru, Dani Gobble, MD  ?linaclotide (LINZESS)  290 MCG CAPS capsule Take 1 capsule (290 mcg total) by mouth daily before breakfast. 05/27/21   Vladimir Crofts, PA-C  ?midodrine (PROAMATINE) 10 MG tablet Take 1 tablet (10 mg total) by mouth 3 (three) times daily with meals. 04/28/21   Caren Griffins, MD  ?nitroGLYCERIN (NITROSTAT) 0.4 MG SL tablet Place 1 tablet (0.4 mg total) under the tongue every 5 (five) minutes as needed for chest pain. 11/13/16 03/18/22  Erlene Quan, PA-C  ?OXYGEN Inhale 3 L/min into the lungs continuous.     [provider]  ?oxymetazoline (AFRIN) 0.05 % nasal spray Place 1 spray into both nostrils 2 (two) times daily as needed for congestion.    [provider]  ?pantoprazole (PROTONIX) 40 MG tablet Take 1 tablet (40 mg total) by mouth 2 (two) times daily. 07/08/19   [name], MD  ?POLYETHYLENE GLYCOL 3350 PO Take 17 g by mouth in the morning and at bedtime.    [provider]  ?predniSONE (DELTASONE) 10 MG tablet Take 10 mg by mouth daily with breakfast.    [provider]  ?RESTASIS 0.05 % ophthalmic emulsion Place 1 drop into both eyes 2 (two) times daily. 03/19/21   [provider]  ?rosuvastatin (CRESTOR) 40 MG tablet Take 1 tablet (40 mg total) by mouth every evening. NEED OV. 09/19/20   [name], Laxman, MD  ?senna-docusate (SENOKOT-S) 8.6-50 MG tablet Take 1 tablet by mouth 2 (two) times daily. 04/28/21   Caren Griffins, MD  ?simethicone (MYLICON) 80 MG chewable tablet Chew 1 tablet (80 mg total) by mouth every 6 (six) hours as needed for flatulence. 04/28/21   Caren Griffins, MD  ?sucralfate (CARAFATE) 1 g tablet Take 1 tablet (1 g total) by mouth 4 (four) times daily -  with meals and at bedtime. 04/28/21   Caren Griffins, MD  ?   ? ?Allergies    ?Ivp dye [iodinated contrast media], Shellfish allergy, Sulfa antibiotics, Iodine, Atorvastatin, Benadryl [diphenhydramine], Colchicine, Contrast media [iodinated contrast media], Doxycycline, Uloric [febuxostat], Cephalexin,  and Zithromax [azithromycin]   ? ?Review of Systems   ?Review of Systems  ?Respiratory:  Positive for shortness of breath.   ?All other systems reviewed and are negative. ? ?Physical Exam ?Updated Vital Signs ?BP 117/68   Pulse 93   Temp (!) 97.5 ?F (36.4 ?C) (Oral)   Resp 12   SpO2 100%  ?Physical Exam ?Vitals and nursing note reviewed.  ?Constitutional:   ?   General: She is not in acute distress. ?   Appearance: Normal appearance. She is well-developed.  ?HENT:  ?   Head: Normocephalic and atraumatic.  ?Eyes:  ?   Conjunctiva/sclera: Conjunctivae normal.  ?   Pupils: Pupils are equal, round, and reactive to light.  ?Cardiovascular:  ?   Rate and Rhythm: Normal rate and regular rhythm.  ?   Heart sounds: Normal heart sounds.  ?  Pulmonary:  ?   Effort: Pulmonary effort is normal. No respiratory distress.  ?   Comments: Decreased breath sounds at bilateral bases ? ?Dialysis catheter in place in left anterior chest wall.  No surrounding erythema noted. ?Abdominal:  ?   General: There is no distension.  ?   Palpations: Abdomen is soft.  ?   Tenderness: There is no abdominal tenderness.  ?Musculoskeletal:     ?   General: No deformity. Normal range of motion.  ?   Cervical back: Normal range of motion and neck supple.  ?   Right lower leg: Edema present.  ?   Left lower leg: Edema present.  ?Skin: ?   General: Skin is warm and dry.  ?Neurological:  ?   General: No focal deficit present.  ?   Mental Status: She is alert and oriented to person, place, and time.  ? ? ?ED Results / Procedures / Treatments   ?Labs ?(all labs ordered are listed, but only abnormal results are displayed) ?Labs Reviewed  ?CBC - Abnormal; Notable for the following components:  ?    Result Value  ? RBC 3.01 (*)   ? Hemoglobin 9.5 (*)   ? HCT 33.7 (*)   ? MCV 112.0 (*)   ? MCHC 28.2 (*)   ? RDW 15.9 (*)   ? All other components within normal limits  ?COMPREHENSIVE METABOLIC PANEL - Abnormal; Notable for the following components:  ? Glucose, Bld  138 (*)   ? BUN 35 (*)   ? Creatinine, Ser 2.54 (*)   ? Total Protein 5.9 (*)   ? Albumin 2.7 (*)   ? Alkaline Phosphatase 253 (*)   ? GFR, Estimated 19 (*)   ? All other components within normal limits  ?BRAIN NA

## 2021-06-11 NOTE — Progress Notes (Addendum)
Asked to see patient for dialysis.  Pt denies missing any HD but came here for "swelling in my legs". Also having social/ caretaking issues at home, is living w/ son but he is not able to care for her.  Denies any sig SOB. Wears O2 [place] at all times at home.  On exam has 1-2+ bilat LE edema, clear lungs. CXR negative. Nasal O2, no distress. She says she is doing HD 2x per week on Tu- Sat schedule, started HD in Feb 2023.  Has L chest TDC for access, no AVF/ AVG.  Will plan HD today on schedule. If still here will consider extra HD Thursday for vol overload.  ? ?  Na 141  K 3.4  CO2 30  BUN 36  Creat 2.68 ? ? TTS [facility] ? 4h  2k/ 3CA bath 97kg   [facility] TDC  Hep 2000 ?- last Hb 8.3 ?- mircera 60 q2, last 4/26  ?- venofer 100 q hd, 4/22- 5/20 ?- last HD 4/29 97.4 > 96.8kg ? - pre HD cr 1.3- 1.8 ? - started HD peak creat 7.0 on 04/11/21 ? ? ? ?Kelly Splinter, MD ?06/11/2021, 10:21 AM ? ? ? ? ?

## 2021-06-11 NOTE — ED Notes (Signed)
Oxygen tank replaced °

## 2021-06-11 NOTE — Discharge Instructions (Addendum)
Return for any problem.  ?

## 2021-06-11 NOTE — Procedures (Signed)
? ?  I was present at this dialysis session, have reviewed the session itself and made  appropriate changes ?Kelly Splinter MD ?[employer] ?pager [phone]   ?06/11/2021, 4:09 PM ? ? ?

## 2021-06-12 DIAGNOSIS — M2550 Pain in unspecified joint: Secondary | ICD-10-CM | POA: Diagnosis not present

## 2021-06-12 DIAGNOSIS — R531 Weakness: Secondary | ICD-10-CM | POA: Diagnosis not present

## 2021-06-12 DIAGNOSIS — Z7401 Bed confinement status: Secondary | ICD-10-CM | POA: Diagnosis not present

## 2021-06-13 ENCOUNTER — Telehealth: Payer: Self-pay | Admitting: Orthopaedic Surgery

## 2021-06-13 DIAGNOSIS — J9621 Acute and chronic respiratory failure with hypoxia: Secondary | ICD-10-CM | POA: Diagnosis not present

## 2021-06-13 DIAGNOSIS — I442 Atrioventricular block, complete: Secondary | ICD-10-CM | POA: Diagnosis not present

## 2021-06-13 DIAGNOSIS — G4733 Obstructive sleep apnea (adult) (pediatric): Secondary | ICD-10-CM | POA: Diagnosis not present

## 2021-06-13 DIAGNOSIS — M454 Ankylosing spondylitis of thoracic region: Secondary | ICD-10-CM | POA: Diagnosis not present

## 2021-06-13 DIAGNOSIS — M1000 Idiopathic gout, unspecified site: Secondary | ICD-10-CM | POA: Diagnosis not present

## 2021-06-13 DIAGNOSIS — I132 Hypertensive heart and chronic kidney disease with heart failure and with stage 5 chronic kidney disease, or end stage renal disease: Secondary | ICD-10-CM | POA: Diagnosis not present

## 2021-06-13 DIAGNOSIS — M19072 Primary osteoarthritis, left ankle and foot: Secondary | ICD-10-CM | POA: Diagnosis not present

## 2021-06-13 DIAGNOSIS — E2740 Unspecified adrenocortical insufficiency: Secondary | ICD-10-CM | POA: Diagnosis not present

## 2021-06-13 DIAGNOSIS — H04123 Dry eye syndrome of bilateral lacrimal glands: Secondary | ICD-10-CM | POA: Diagnosis not present

## 2021-06-13 DIAGNOSIS — N186 End stage renal disease: Secondary | ICD-10-CM | POA: Diagnosis not present

## 2021-06-13 DIAGNOSIS — M7989 Other specified soft tissue disorders: Secondary | ICD-10-CM | POA: Diagnosis not present

## 2021-06-13 DIAGNOSIS — J4520 Mild intermittent asthma, uncomplicated: Secondary | ICD-10-CM | POA: Diagnosis not present

## 2021-06-13 DIAGNOSIS — E782 Mixed hyperlipidemia: Secondary | ICD-10-CM | POA: Diagnosis not present

## 2021-06-13 DIAGNOSIS — I9589 Other hypotension: Secondary | ICD-10-CM | POA: Diagnosis not present

## 2021-06-13 DIAGNOSIS — G8929 Other chronic pain: Secondary | ICD-10-CM | POA: Diagnosis not present

## 2021-06-13 DIAGNOSIS — I429 Cardiomyopathy, unspecified: Secondary | ICD-10-CM | POA: Diagnosis not present

## 2021-06-13 DIAGNOSIS — J449 Chronic obstructive pulmonary disease, unspecified: Secondary | ICD-10-CM | POA: Diagnosis not present

## 2021-06-13 DIAGNOSIS — I5032 Chronic diastolic (congestive) heart failure: Secondary | ICD-10-CM | POA: Diagnosis not present

## 2021-06-13 DIAGNOSIS — D631 Anemia in chronic kidney disease: Secondary | ICD-10-CM | POA: Diagnosis not present

## 2021-06-13 DIAGNOSIS — E1122 Type 2 diabetes mellitus with diabetic chronic kidney disease: Secondary | ICD-10-CM | POA: Diagnosis not present

## 2021-06-13 DIAGNOSIS — S72142D Displaced intertrochanteric fracture of left femur, subsequent encounter for closed fracture with routine healing: Secondary | ICD-10-CM | POA: Diagnosis not present

## 2021-06-13 DIAGNOSIS — I2510 Atherosclerotic heart disease of native coronary artery without angina pectoris: Secondary | ICD-10-CM | POA: Diagnosis not present

## 2021-06-13 DIAGNOSIS — G4700 Insomnia, unspecified: Secondary | ICD-10-CM | POA: Diagnosis not present

## 2021-06-13 DIAGNOSIS — I4891 Unspecified atrial fibrillation: Secondary | ICD-10-CM | POA: Diagnosis not present

## 2021-06-13 NOTE — Telephone Encounter (Signed)
Patient's son [name] called asked if his mother can get another brace for her back. [name] said the brace she is wearing is falling apart. The number to contact [name] is [phone] ?

## 2021-06-13 NOTE — Telephone Encounter (Signed)
Can you advise? 

## 2021-06-13 NOTE — Telephone Encounter (Signed)
I called [name] and advised. He states that his mom cannot come in next Tuesday due to dialysis. Back pain is causing problems with doing PT for hip. Appt made for tomorrow afternoon as Dr. Lorin Mercy is out of the office for the remainder of next week. ?

## 2021-06-13 NOTE — Telephone Encounter (Signed)
Sending to you as it looks like Dr. Sammuel Hines has been treating?  Please let me know if you need me to do anything. ?

## 2021-06-14 ENCOUNTER — Ambulatory Visit (INDEPENDENT_AMBULATORY_CARE_PROVIDER_SITE_OTHER): Payer: Medicare Other | Admitting: Orthopaedic Surgery

## 2021-06-14 ENCOUNTER — Ambulatory Visit (INDEPENDENT_AMBULATORY_CARE_PROVIDER_SITE_OTHER): Payer: Medicare Other

## 2021-06-14 ENCOUNTER — Encounter: Payer: Self-pay | Admitting: Orthopaedic Surgery

## 2021-06-14 ENCOUNTER — Telehealth: Payer: Self-pay

## 2021-06-14 VITALS — BP 129/74 | HR 97 | Ht 63.0 in | Wt 230.0 lb

## 2021-06-14 DIAGNOSIS — I9589 Other hypotension: Secondary | ICD-10-CM | POA: Diagnosis not present

## 2021-06-14 DIAGNOSIS — H04123 Dry eye syndrome of bilateral lacrimal glands: Secondary | ICD-10-CM | POA: Diagnosis not present

## 2021-06-14 DIAGNOSIS — M545 Low back pain, unspecified: Secondary | ICD-10-CM

## 2021-06-14 DIAGNOSIS — S72142D Displaced intertrochanteric fracture of left femur, subsequent encounter for closed fracture with routine healing: Secondary | ICD-10-CM | POA: Diagnosis not present

## 2021-06-14 DIAGNOSIS — G8929 Other chronic pain: Secondary | ICD-10-CM | POA: Diagnosis not present

## 2021-06-14 DIAGNOSIS — E2740 Unspecified adrenocortical insufficiency: Secondary | ICD-10-CM | POA: Diagnosis not present

## 2021-06-14 DIAGNOSIS — G4733 Obstructive sleep apnea (adult) (pediatric): Secondary | ICD-10-CM | POA: Diagnosis not present

## 2021-06-14 DIAGNOSIS — M1000 Idiopathic gout, unspecified site: Secondary | ICD-10-CM | POA: Diagnosis not present

## 2021-06-14 DIAGNOSIS — D631 Anemia in chronic kidney disease: Secondary | ICD-10-CM | POA: Diagnosis not present

## 2021-06-14 DIAGNOSIS — M7989 Other specified soft tissue disorders: Secondary | ICD-10-CM | POA: Diagnosis not present

## 2021-06-14 DIAGNOSIS — G4700 Insomnia, unspecified: Secondary | ICD-10-CM | POA: Diagnosis not present

## 2021-06-14 DIAGNOSIS — M549 Dorsalgia, unspecified: Secondary | ICD-10-CM | POA: Diagnosis not present

## 2021-06-14 DIAGNOSIS — E782 Mixed hyperlipidemia: Secondary | ICD-10-CM | POA: Diagnosis not present

## 2021-06-14 DIAGNOSIS — I2510 Atherosclerotic heart disease of native coronary artery without angina pectoris: Secondary | ICD-10-CM | POA: Diagnosis not present

## 2021-06-14 DIAGNOSIS — I442 Atrioventricular block, complete: Secondary | ICD-10-CM | POA: Diagnosis not present

## 2021-06-14 DIAGNOSIS — I5032 Chronic diastolic (congestive) heart failure: Secondary | ICD-10-CM | POA: Diagnosis not present

## 2021-06-14 DIAGNOSIS — J9621 Acute and chronic respiratory failure with hypoxia: Secondary | ICD-10-CM | POA: Diagnosis not present

## 2021-06-14 DIAGNOSIS — J4520 Mild intermittent asthma, uncomplicated: Secondary | ICD-10-CM | POA: Diagnosis not present

## 2021-06-14 DIAGNOSIS — N186 End stage renal disease: Secondary | ICD-10-CM | POA: Diagnosis not present

## 2021-06-14 DIAGNOSIS — J449 Chronic obstructive pulmonary disease, unspecified: Secondary | ICD-10-CM | POA: Diagnosis not present

## 2021-06-14 DIAGNOSIS — I4891 Unspecified atrial fibrillation: Secondary | ICD-10-CM | POA: Diagnosis not present

## 2021-06-14 DIAGNOSIS — E1122 Type 2 diabetes mellitus with diabetic chronic kidney disease: Secondary | ICD-10-CM | POA: Diagnosis not present

## 2021-06-14 DIAGNOSIS — M19072 Primary osteoarthritis, left ankle and foot: Secondary | ICD-10-CM | POA: Diagnosis not present

## 2021-06-14 DIAGNOSIS — I429 Cardiomyopathy, unspecified: Secondary | ICD-10-CM | POA: Diagnosis not present

## 2021-06-14 DIAGNOSIS — I132 Hypertensive heart and chronic kidney disease with heart failure and with stage 5 chronic kidney disease, or end stage renal disease: Secondary | ICD-10-CM | POA: Diagnosis not present

## 2021-06-14 DIAGNOSIS — M454 Ankylosing spondylitis of thoracic region: Secondary | ICD-10-CM | POA: Diagnosis not present

## 2021-06-14 LAB — HEPATITIS B SURFACE ANTIBODY, QUANTITATIVE: Hep B S AB Quant (Post): >1000 m[IU]/mL (ref >9.9)

## 2021-06-14 NOTE — Telephone Encounter (Signed)
Thank you.  We will continue to monitor.  It does look like atrial fibrillation but the episodes are very brief.  I do not think this yet meets criteria for full anticoagulation.  She is on aspirin and Plavix for CAD. ?

## 2021-06-14 NOTE — Telephone Encounter (Signed)
Unscheduled transmission ?10 ATR's, 1 shows 42mn of AF, occured 5/4 @ 18:34 others appear AT ?No OAC ?Route to triage ?LA ? ?Remote history noted in Epic. No OAC. Routing to Dr. CSallyanne Kusterfor advisement. Anticipate cont to monitor. Of Note patient presented to ED with shortness of breath 5/1 however did not meet requirements for admission. Patient ESRD and T, TH, Sat dialysis patient.  ? ? ? ? ? ?

## 2021-06-14 NOTE — Progress Notes (Signed)
? ?Office Visit Note ?  ?Patient: [name]           ?Date of Birth: 06-May-1945           ?MRN: [id number] ?Visit Date: 06/14/2021 ?             ?Requested by: Lujean Amel, MD ?[address] ?[address] ?[place],  [place] [address] ?PCP: Lujean Amel, MD ? ? ?Assessment & Plan: ?Visit Diagnoses:  ?1. Mid back pain   ?2. Chronic bilateral low back pain, unspecified whether sciatica present   ? ? ?Plan: TED hose applied for the swelling she can elevate her leg for the swelling if that she can but is limited somewhat with heart failure and dyspnea problems.  She is having trouble standing and transferring.  Therapy is coming out twice a week. ? ?Follow-Up Instructions: No follow-ups on file.  ? ?Orders:  ?Orders Placed This Encounter  ?Procedures  ? XR Lumbar Spine 2-3 Views  ? XR Thoracic Spine 2 View  ? ?No orders of the defined types were placed in this encounter. ? ? ? ? Procedures: ?No procedures performed ? ? ?Clinical Data: ?No additional findings. ? ? ?Subjective: ?Chief Complaint  ?Patient presents with  ? Middle Back - Pain  ? Lower Back - Pain  ? ? ?HPI 76 year old female returns she is in the hospital recently had hip fracture fixed by Dr. Sammuel Hines and had pain in the upper lumbar region.  X-rays have shown multilevel spondylosis.  She had abdominal pain had a CT abdomen and pelvis which again showed no evidence lumbar compression fracture but she does have multilevel spondylosis thoracic region and lumbar region without spinal stenosis.  She had a brace that tended to help her stand up a little bit better.  This is worn unraveled and she states is disintegrated.  Dog bumped her leg she has pitting edema bilaterally and she goes to dialysis tomorrow. ? ?Review of Systems all the systems noncontributory.  On oxygen cardiomyopathy short of breath previous angioplasties CABG x5  ? ? ?Objective: ?Vital Signs: BP 129/74   Pulse 97   Ht '5\' 3"'$  (1.6 m)   Wt 230 lb (104.3 kg)   BMI 40.74 kg/m?   ? ?Physical Exam ?HENT:  ?   Head: Normocephalic.  ?   Right Ear: External ear normal.  ?   Left Ear: External ear normal.  ?   Nose: Nose normal.  ?Cardiovascular:  ?   Comments: Bilateral pitting edema.  Defibrillator left chest wall ?Pulmonary:  ?   Comments: Mild dyspnea. ? ? ?Ortho Exam patient has multiple upper extremity ecchymosis areas.  On chronic prednisone thin skin.  Small area where the dog bumped her mid tibia with slight erythema around it no purulence no cellulitis. ? ?Specialty Comments:  ?No specialty comments available. ? ?Imaging: ?No results found. ? ? ?PMFS History: ?Patient Active Problem List  ? Diagnosis Date Noted  ? Hypervolemia associated with renal insufficiency 06/11/2021  ? Physical deconditioning 06/11/2021  ? Subcutaneous fat necrosis   ? Pressure injury of skin 04/13/2021  ? Closed left hip fracture ([place]) secondary to fall at home 04/03/2021  ? Closed fracture of left superior pubic ramus ([place]) 04/03/2021  ? Chronic kidney disease, stage III B (moderate) (HCC) 04/03/2021  ? Fall at home, initial encounter 04/03/2021  ? Hypokalemia 04/03/2021  ? Obesity hypoventilation syndrome ([place]) 04/02/2021  ? Primary osteoarthritis, left ankle and foot 02/05/2021  ? Allergy to shellfish 01/15/2021  ?  Decreased estrogen level 01/15/2021  ? Diabetic renal disease ([place]) 01/15/2021  ? Gallstones 01/15/2021  ? Idiopathic gout 01/15/2021  ? Mild intermittent asthma 01/15/2021  ? Pure hypercholesterolemia 01/15/2021  ? CHF (congestive heart failure) ([place]) 01/07/2021  ? Chronic diastolic heart failure ([place]) 12/31/2020  ? Nasal congestion with rhinorrhea 10/24/2020  ? Carotid stenosis 10/01/2020  ? Ischemic cardiomyopathy 09/27/2020  ? Hypertension 09/19/2020  ? Acute left-sided weakness 09/16/2020  ? Blood blister 08/01/2020  ? Elevated troponin 06/01/2019  ? Irritable bowel syndrome with constipation 10/21/2018  ? Chronic pain 07/30/2018  ? Congestive heart failure (CHF) ([place]) 07/30/2018  ?  Supplemental oxygen dependent 07/30/2018  ? Leukopenia 05/03/2018  ? Thrombocytopenia ([place]) 05/03/2018  ? Pneumonia due to human metapneumovirus 05/02/2018  ? Chest pain of uncertain etiology 76/73/4193  ? Allergic rhinitis 04/13/2018  ? S/P laparoscopic cholecystectomy 04/09/2018  ? Respiratory failure ([place]) 03/07/2018  ? Polyp of cecum   ? Polyp of transverse colon   ? Positive colorectal cancer screening using Cologuard test   ? Dysphagia   ? Acute on chronic renal failure ([place]) 11/20/2017  ? Coronary artery disease of native artery of native heart with stable angina pectoris ([place]) 11/20/2017  ? Diabetes mellitus without complication ([place]) 79/03/4095  ? Abdominal pain, epigastric 11/20/2017  ? Vasomotor rhinitis 10/16/2016  ? Right facial numbness 03/05/2016  ? ESRD (end stage renal disease) ([place]) 03/05/2016  ? Atrial fibrillation ([place]) 10/24/2015  ? Chest pain with moderate risk of acute coronary syndrome 10/23/2015  ? History of TIA (transient ischemic attack) 10/22/2015  ? CHB (complete heart block) ([place]) 06/22/2015  ? Allergic drug rash due to anti-infective agent 04/29/2015  ? Fatigue 02/14/2015  ? Chronic diastolic CHF (congestive heart failure) ([place]) 02/14/2015  ? Cellulitis 12/21/2014  ? S/P CABG x 5 11/29/2014  ? CAD S/P percutaneous coronary angioplasty   ? Diarrhea 07/12/2014  ? Abdominal pain 07/12/2014  ? Acute on chronic diastolic heart failure ([place]) 11/04/2013  ? Mixed hypercholesterolemia and hypertriglyceridemia 04/22/2013  ? Vertigo 04/22/2013  ? Dyspnea on exertion 08/25/2012  ? Allergic to IV contrast 07/23/2012  ? Unstable angina ([place]) 07/22/2012  ? Obesity 11/22/2011  ? Cardiomyopathy ([place]) 11/22/2011  ? Gastroesophageal reflux disease 11/22/2011  ? Back pain 11/22/2011  ? OSA (obstructive sleep apnea) 11/22/2011  ? HEMORRHOIDS-EXTERNAL 02/21/2010  ? NAUSEA 02/21/2010  ? ABDOMINAL PAIN -GENERALIZED 02/21/2010  ? PERSONAL HX COLONIC POLYPS 02/21/2010  ? ANEMIA 04/16/2007  ? HTN  (hypertension), benign 04/16/2007  ? DIVERTICULOSIS, COLON 04/16/2007  ? ARTHRITIS 04/16/2007  ? INTERNAL HEMORRHOIDS WITHOUT MENTION COMP 04/12/2007  ? Sleep apnea 04/12/2007  ? Cardiac resynchronization therapy pacemaker (CRT-P) in place 04/12/2007  ? Gastritis without bleeding 12/14/2006  ? COLONIC POLYPS, HYPERPLASTIC 09/27/2002  ? FATTY LIVER DISEASE 02/16/2002  ? ?Past Medical History:  ?Diagnosis Date  ? AICD (automatic cardioverter/defibrillator) present   ? downgraded to CRT-P in 2014  ? Anemia   ? Arthritis   ? Asthma   ? Atrial fibrillation ([place]) 10/24/2015  ? Questionable history of A Fib/A Flutter. On device interrogation on 9/7, showed 0.25mn of A Fib/A Flutter with 1% burden.  Device check in Jan 2018 showed no sustained AF (runs of less than 30 seconds). No anticoagulation indicated.  ? CAD (coronary artery disease)   ? a. 10/09/16 LHC: SVG->LAD patent, SVG->Diag patent, SVG->RCA patent, SVG->LCx occluded. EF 60%, b. 10/31/16 LHC DES to AV groove Circ, DES to intermed branch  ? CHF (congestive heart failure) ([place]   ?  Chronic lower back pain   ? Fatty liver disease, nonalcoholic   ? GERD (gastroesophageal reflux disease)   ? Gout   ? H/O hiatal hernia   ? High cholesterol   ? Hyperplastic colon polyp   ? Hypertension   ? IBS (irritable bowel syndrome)   ? Internal hemorrhoids   ? INTERNAL HEMORRHOIDS WITHOUT MENTION COMP 04/12/2007  ? Qualifier: Diagnosis of  By: Olevia Perches MD, Lowella Bandy   ? Nonischemic cardiomyopathy ([place]) 11/22/2011  ? Pt responded to BiV ICD- last EF 55-60% Sept 2017  ? Obesity   ? OSA (obstructive sleep apnea)   ? "can't tolerate a mask", wears 2L nocturnal O2  ? Presence of permanent cardiac pacemaker   ? 11/02/12 [employer] V273 INTUA PPM  ? Stroke ([place]) 09/2020  ? Type II diabetes mellitus ([place])   ?  ?Family History  ?Problem Relation Age of Onset  ? Breast cancer Mother   ? Diabetes Mother   ? Heart disease Maternal Grandmother   ? Kidney disease Maternal Grandmother   ?  Diabetes Maternal Grandmother   ? Glaucoma Maternal Aunt   ? Heart disease Maternal Aunt   ? Asthma Sister   ? Colon cancer Neg Hx   ? Stomach cancer Neg Hx   ? Pancreatic cancer Neg Hx   ?  ?Past Surgical History:  ?Procedure Laterality

## 2021-06-15 DIAGNOSIS — T8249XA Other complication of vascular dialysis catheter, initial encounter: Secondary | ICD-10-CM | POA: Diagnosis not present

## 2021-06-15 DIAGNOSIS — D689 Coagulation defect, unspecified: Secondary | ICD-10-CM | POA: Diagnosis not present

## 2021-06-15 DIAGNOSIS — Z7689 Persons encountering health services in other specified circumstances: Secondary | ICD-10-CM | POA: Diagnosis not present

## 2021-06-15 DIAGNOSIS — D631 Anemia in chronic kidney disease: Secondary | ICD-10-CM | POA: Diagnosis not present

## 2021-06-15 DIAGNOSIS — N178 Other acute kidney failure: Secondary | ICD-10-CM | POA: Diagnosis not present

## 2021-06-15 DIAGNOSIS — Z992 Dependence on renal dialysis: Secondary | ICD-10-CM | POA: Diagnosis not present

## 2021-06-15 DIAGNOSIS — Z23 Encounter for immunization: Secondary | ICD-10-CM | POA: Diagnosis not present

## 2021-06-15 DIAGNOSIS — R52 Pain, unspecified: Secondary | ICD-10-CM | POA: Diagnosis not present

## 2021-06-17 ENCOUNTER — Telehealth: Payer: Self-pay | Admitting: Cardiovascular Disease

## 2021-06-17 ENCOUNTER — Encounter (HOSPITAL_COMMUNITY): Payer: Medicare Other | Admitting: Internal Medicine

## 2021-06-17 DIAGNOSIS — I4891 Unspecified atrial fibrillation: Secondary | ICD-10-CM | POA: Diagnosis not present

## 2021-06-17 DIAGNOSIS — J4520 Mild intermittent asthma, uncomplicated: Secondary | ICD-10-CM | POA: Diagnosis not present

## 2021-06-17 DIAGNOSIS — J449 Chronic obstructive pulmonary disease, unspecified: Secondary | ICD-10-CM | POA: Diagnosis not present

## 2021-06-17 DIAGNOSIS — M19072 Primary osteoarthritis, left ankle and foot: Secondary | ICD-10-CM | POA: Diagnosis not present

## 2021-06-17 DIAGNOSIS — N186 End stage renal disease: Secondary | ICD-10-CM | POA: Diagnosis not present

## 2021-06-17 DIAGNOSIS — E2740 Unspecified adrenocortical insufficiency: Secondary | ICD-10-CM | POA: Diagnosis not present

## 2021-06-17 DIAGNOSIS — I5032 Chronic diastolic (congestive) heart failure: Secondary | ICD-10-CM | POA: Diagnosis not present

## 2021-06-17 DIAGNOSIS — M7989 Other specified soft tissue disorders: Secondary | ICD-10-CM | POA: Diagnosis not present

## 2021-06-17 DIAGNOSIS — H04123 Dry eye syndrome of bilateral lacrimal glands: Secondary | ICD-10-CM | POA: Diagnosis not present

## 2021-06-17 DIAGNOSIS — G4733 Obstructive sleep apnea (adult) (pediatric): Secondary | ICD-10-CM | POA: Diagnosis not present

## 2021-06-17 DIAGNOSIS — I9589 Other hypotension: Secondary | ICD-10-CM | POA: Diagnosis not present

## 2021-06-17 DIAGNOSIS — I2510 Atherosclerotic heart disease of native coronary artery without angina pectoris: Secondary | ICD-10-CM | POA: Diagnosis not present

## 2021-06-17 DIAGNOSIS — I132 Hypertensive heart and chronic kidney disease with heart failure and with stage 5 chronic kidney disease, or end stage renal disease: Secondary | ICD-10-CM | POA: Diagnosis not present

## 2021-06-17 DIAGNOSIS — M1000 Idiopathic gout, unspecified site: Secondary | ICD-10-CM | POA: Diagnosis not present

## 2021-06-17 DIAGNOSIS — I429 Cardiomyopathy, unspecified: Secondary | ICD-10-CM | POA: Diagnosis not present

## 2021-06-17 DIAGNOSIS — G4700 Insomnia, unspecified: Secondary | ICD-10-CM | POA: Diagnosis not present

## 2021-06-17 DIAGNOSIS — E782 Mixed hyperlipidemia: Secondary | ICD-10-CM | POA: Diagnosis not present

## 2021-06-17 DIAGNOSIS — S72142D Displaced intertrochanteric fracture of left femur, subsequent encounter for closed fracture with routine healing: Secondary | ICD-10-CM | POA: Diagnosis not present

## 2021-06-17 DIAGNOSIS — G8929 Other chronic pain: Secondary | ICD-10-CM | POA: Diagnosis not present

## 2021-06-17 DIAGNOSIS — D631 Anemia in chronic kidney disease: Secondary | ICD-10-CM | POA: Diagnosis not present

## 2021-06-17 DIAGNOSIS — I442 Atrioventricular block, complete: Secondary | ICD-10-CM | POA: Diagnosis not present

## 2021-06-17 DIAGNOSIS — M454 Ankylosing spondylitis of thoracic region: Secondary | ICD-10-CM | POA: Diagnosis not present

## 2021-06-17 DIAGNOSIS — J9621 Acute and chronic respiratory failure with hypoxia: Secondary | ICD-10-CM | POA: Diagnosis not present

## 2021-06-17 DIAGNOSIS — E1122 Type 2 diabetes mellitus with diabetic chronic kidney disease: Secondary | ICD-10-CM | POA: Diagnosis not present

## 2021-06-17 NOTE — Telephone Encounter (Signed)
We had a full download on her device on May 4 and it was working normally.  ECG from May 1 visit to ED also looks normal (atrial sensed, biventricular paced rhythm).  Reviewed notes from emergency room physician and admitting hospitalist and I do not see a mention of any problems with the pacemaker "not acting right". ?She has had some few brief bursts of atrial fibrillation, lasting for only a few seconds at a time.  For the time being no treatment changes recommended. ?

## 2021-06-17 NOTE — Telephone Encounter (Signed)
Called patient, advised of message below from MD.  Patient verbalized understanding.  

## 2021-06-17 NOTE — Telephone Encounter (Signed)
Patient calling to speak with Dr.Croitoru's nurse. She says she just got out of a nursing home and needs to update her and discuss her pacemaker. She says she is staying with a friend right now and to call: [phone] ?

## 2021-06-17 NOTE — Telephone Encounter (Signed)
Called patient, she states she was seen in the hospital and they mentioned to her that her pacemaker was "not acting right" she does not know what this means, she says they suggested she notify Dr.C's office- I advised I would send a message to him and our device clinic to see if they can review it.  ?She is scheduled for a remote transmission on 05/23-  ? ?Thanks! ?

## 2021-06-18 ENCOUNTER — Ambulatory Visit: Payer: Medicare Other | Admitting: Orthopaedic Surgery

## 2021-06-18 DIAGNOSIS — T8249XA Other complication of vascular dialysis catheter, initial encounter: Secondary | ICD-10-CM | POA: Diagnosis not present

## 2021-06-18 DIAGNOSIS — N178 Other acute kidney failure: Secondary | ICD-10-CM | POA: Diagnosis not present

## 2021-06-18 DIAGNOSIS — R52 Pain, unspecified: Secondary | ICD-10-CM | POA: Diagnosis not present

## 2021-06-18 DIAGNOSIS — D631 Anemia in chronic kidney disease: Secondary | ICD-10-CM | POA: Diagnosis not present

## 2021-06-18 DIAGNOSIS — Z7689 Persons encountering health services in other specified circumstances: Secondary | ICD-10-CM | POA: Diagnosis not present

## 2021-06-18 DIAGNOSIS — Z23 Encounter for immunization: Secondary | ICD-10-CM | POA: Diagnosis not present

## 2021-06-18 DIAGNOSIS — D689 Coagulation defect, unspecified: Secondary | ICD-10-CM | POA: Diagnosis not present

## 2021-06-18 DIAGNOSIS — Z992 Dependence on renal dialysis: Secondary | ICD-10-CM | POA: Diagnosis not present

## 2021-06-19 DIAGNOSIS — I132 Hypertensive heart and chronic kidney disease with heart failure and with stage 5 chronic kidney disease, or end stage renal disease: Secondary | ICD-10-CM | POA: Diagnosis not present

## 2021-06-19 DIAGNOSIS — M7989 Other specified soft tissue disorders: Secondary | ICD-10-CM | POA: Diagnosis not present

## 2021-06-19 DIAGNOSIS — I429 Cardiomyopathy, unspecified: Secondary | ICD-10-CM | POA: Diagnosis not present

## 2021-06-19 DIAGNOSIS — N186 End stage renal disease: Secondary | ICD-10-CM | POA: Diagnosis not present

## 2021-06-19 DIAGNOSIS — G8929 Other chronic pain: Secondary | ICD-10-CM | POA: Diagnosis not present

## 2021-06-19 DIAGNOSIS — M19072 Primary osteoarthritis, left ankle and foot: Secondary | ICD-10-CM | POA: Diagnosis not present

## 2021-06-19 DIAGNOSIS — I2510 Atherosclerotic heart disease of native coronary artery without angina pectoris: Secondary | ICD-10-CM | POA: Diagnosis not present

## 2021-06-19 DIAGNOSIS — I4891 Unspecified atrial fibrillation: Secondary | ICD-10-CM | POA: Diagnosis not present

## 2021-06-19 DIAGNOSIS — E1122 Type 2 diabetes mellitus with diabetic chronic kidney disease: Secondary | ICD-10-CM | POA: Diagnosis not present

## 2021-06-19 DIAGNOSIS — E2740 Unspecified adrenocortical insufficiency: Secondary | ICD-10-CM | POA: Diagnosis not present

## 2021-06-19 DIAGNOSIS — D631 Anemia in chronic kidney disease: Secondary | ICD-10-CM | POA: Diagnosis not present

## 2021-06-19 DIAGNOSIS — S72142D Displaced intertrochanteric fracture of left femur, subsequent encounter for closed fracture with routine healing: Secondary | ICD-10-CM | POA: Diagnosis not present

## 2021-06-19 DIAGNOSIS — G4733 Obstructive sleep apnea (adult) (pediatric): Secondary | ICD-10-CM | POA: Diagnosis not present

## 2021-06-19 DIAGNOSIS — H04123 Dry eye syndrome of bilateral lacrimal glands: Secondary | ICD-10-CM | POA: Diagnosis not present

## 2021-06-19 DIAGNOSIS — M454 Ankylosing spondylitis of thoracic region: Secondary | ICD-10-CM | POA: Diagnosis not present

## 2021-06-19 DIAGNOSIS — R110 Nausea: Secondary | ICD-10-CM | POA: Diagnosis not present

## 2021-06-19 DIAGNOSIS — J4520 Mild intermittent asthma, uncomplicated: Secondary | ICD-10-CM | POA: Diagnosis not present

## 2021-06-19 DIAGNOSIS — J9621 Acute and chronic respiratory failure with hypoxia: Secondary | ICD-10-CM | POA: Diagnosis not present

## 2021-06-19 DIAGNOSIS — E782 Mixed hyperlipidemia: Secondary | ICD-10-CM | POA: Diagnosis not present

## 2021-06-19 DIAGNOSIS — E1169 Type 2 diabetes mellitus with other specified complication: Secondary | ICD-10-CM | POA: Diagnosis not present

## 2021-06-19 DIAGNOSIS — I5032 Chronic diastolic (congestive) heart failure: Secondary | ICD-10-CM | POA: Diagnosis not present

## 2021-06-19 DIAGNOSIS — J449 Chronic obstructive pulmonary disease, unspecified: Secondary | ICD-10-CM | POA: Diagnosis not present

## 2021-06-19 DIAGNOSIS — G4700 Insomnia, unspecified: Secondary | ICD-10-CM | POA: Diagnosis not present

## 2021-06-19 DIAGNOSIS — I9589 Other hypotension: Secondary | ICD-10-CM | POA: Diagnosis not present

## 2021-06-19 DIAGNOSIS — I442 Atrioventricular block, complete: Secondary | ICD-10-CM | POA: Diagnosis not present

## 2021-06-19 DIAGNOSIS — M1000 Idiopathic gout, unspecified site: Secondary | ICD-10-CM | POA: Diagnosis not present

## 2021-06-20 DIAGNOSIS — M1000 Idiopathic gout, unspecified site: Secondary | ICD-10-CM | POA: Diagnosis not present

## 2021-06-20 DIAGNOSIS — I2510 Atherosclerotic heart disease of native coronary artery without angina pectoris: Secondary | ICD-10-CM | POA: Diagnosis not present

## 2021-06-20 DIAGNOSIS — J9621 Acute and chronic respiratory failure with hypoxia: Secondary | ICD-10-CM | POA: Diagnosis not present

## 2021-06-20 DIAGNOSIS — G8929 Other chronic pain: Secondary | ICD-10-CM | POA: Diagnosis not present

## 2021-06-20 DIAGNOSIS — M7989 Other specified soft tissue disorders: Secondary | ICD-10-CM | POA: Diagnosis not present

## 2021-06-20 DIAGNOSIS — N186 End stage renal disease: Secondary | ICD-10-CM | POA: Diagnosis not present

## 2021-06-20 DIAGNOSIS — I132 Hypertensive heart and chronic kidney disease with heart failure and with stage 5 chronic kidney disease, or end stage renal disease: Secondary | ICD-10-CM | POA: Diagnosis not present

## 2021-06-20 DIAGNOSIS — G4733 Obstructive sleep apnea (adult) (pediatric): Secondary | ICD-10-CM | POA: Diagnosis not present

## 2021-06-20 DIAGNOSIS — E2740 Unspecified adrenocortical insufficiency: Secondary | ICD-10-CM | POA: Diagnosis not present

## 2021-06-20 DIAGNOSIS — G4700 Insomnia, unspecified: Secondary | ICD-10-CM | POA: Diagnosis not present

## 2021-06-20 DIAGNOSIS — J449 Chronic obstructive pulmonary disease, unspecified: Secondary | ICD-10-CM | POA: Diagnosis not present

## 2021-06-20 DIAGNOSIS — I429 Cardiomyopathy, unspecified: Secondary | ICD-10-CM | POA: Diagnosis not present

## 2021-06-20 DIAGNOSIS — J4520 Mild intermittent asthma, uncomplicated: Secondary | ICD-10-CM | POA: Diagnosis not present

## 2021-06-20 DIAGNOSIS — I4891 Unspecified atrial fibrillation: Secondary | ICD-10-CM | POA: Diagnosis not present

## 2021-06-20 DIAGNOSIS — I9589 Other hypotension: Secondary | ICD-10-CM | POA: Diagnosis not present

## 2021-06-20 DIAGNOSIS — D631 Anemia in chronic kidney disease: Secondary | ICD-10-CM | POA: Diagnosis not present

## 2021-06-20 DIAGNOSIS — M454 Ankylosing spondylitis of thoracic region: Secondary | ICD-10-CM | POA: Diagnosis not present

## 2021-06-20 DIAGNOSIS — I5032 Chronic diastolic (congestive) heart failure: Secondary | ICD-10-CM | POA: Diagnosis not present

## 2021-06-20 DIAGNOSIS — E782 Mixed hyperlipidemia: Secondary | ICD-10-CM | POA: Diagnosis not present

## 2021-06-20 DIAGNOSIS — S72142D Displaced intertrochanteric fracture of left femur, subsequent encounter for closed fracture with routine healing: Secondary | ICD-10-CM | POA: Diagnosis not present

## 2021-06-20 DIAGNOSIS — H04123 Dry eye syndrome of bilateral lacrimal glands: Secondary | ICD-10-CM | POA: Diagnosis not present

## 2021-06-20 DIAGNOSIS — M19072 Primary osteoarthritis, left ankle and foot: Secondary | ICD-10-CM | POA: Diagnosis not present

## 2021-06-20 DIAGNOSIS — E1122 Type 2 diabetes mellitus with diabetic chronic kidney disease: Secondary | ICD-10-CM | POA: Diagnosis not present

## 2021-06-20 DIAGNOSIS — I442 Atrioventricular block, complete: Secondary | ICD-10-CM | POA: Diagnosis not present

## 2021-06-21 DIAGNOSIS — E2740 Unspecified adrenocortical insufficiency: Secondary | ICD-10-CM | POA: Diagnosis not present

## 2021-06-21 DIAGNOSIS — G4733 Obstructive sleep apnea (adult) (pediatric): Secondary | ICD-10-CM | POA: Diagnosis not present

## 2021-06-21 DIAGNOSIS — D631 Anemia in chronic kidney disease: Secondary | ICD-10-CM | POA: Diagnosis not present

## 2021-06-21 DIAGNOSIS — J9621 Acute and chronic respiratory failure with hypoxia: Secondary | ICD-10-CM | POA: Diagnosis not present

## 2021-06-21 DIAGNOSIS — E782 Mixed hyperlipidemia: Secondary | ICD-10-CM | POA: Diagnosis not present

## 2021-06-21 DIAGNOSIS — J449 Chronic obstructive pulmonary disease, unspecified: Secondary | ICD-10-CM | POA: Diagnosis not present

## 2021-06-21 DIAGNOSIS — I9589 Other hypotension: Secondary | ICD-10-CM | POA: Diagnosis not present

## 2021-06-21 DIAGNOSIS — S72142D Displaced intertrochanteric fracture of left femur, subsequent encounter for closed fracture with routine healing: Secondary | ICD-10-CM | POA: Diagnosis not present

## 2021-06-21 DIAGNOSIS — I5032 Chronic diastolic (congestive) heart failure: Secondary | ICD-10-CM | POA: Diagnosis not present

## 2021-06-21 DIAGNOSIS — I429 Cardiomyopathy, unspecified: Secondary | ICD-10-CM | POA: Diagnosis not present

## 2021-06-21 DIAGNOSIS — N186 End stage renal disease: Secondary | ICD-10-CM | POA: Diagnosis not present

## 2021-06-21 DIAGNOSIS — H04123 Dry eye syndrome of bilateral lacrimal glands: Secondary | ICD-10-CM | POA: Diagnosis not present

## 2021-06-21 DIAGNOSIS — M19072 Primary osteoarthritis, left ankle and foot: Secondary | ICD-10-CM | POA: Diagnosis not present

## 2021-06-21 DIAGNOSIS — I132 Hypertensive heart and chronic kidney disease with heart failure and with stage 5 chronic kidney disease, or end stage renal disease: Secondary | ICD-10-CM | POA: Diagnosis not present

## 2021-06-21 DIAGNOSIS — G4700 Insomnia, unspecified: Secondary | ICD-10-CM | POA: Diagnosis not present

## 2021-06-21 DIAGNOSIS — E1122 Type 2 diabetes mellitus with diabetic chronic kidney disease: Secondary | ICD-10-CM | POA: Diagnosis not present

## 2021-06-21 DIAGNOSIS — I4891 Unspecified atrial fibrillation: Secondary | ICD-10-CM | POA: Diagnosis not present

## 2021-06-21 DIAGNOSIS — I2510 Atherosclerotic heart disease of native coronary artery without angina pectoris: Secondary | ICD-10-CM | POA: Diagnosis not present

## 2021-06-21 DIAGNOSIS — G8929 Other chronic pain: Secondary | ICD-10-CM | POA: Diagnosis not present

## 2021-06-21 DIAGNOSIS — I442 Atrioventricular block, complete: Secondary | ICD-10-CM | POA: Diagnosis not present

## 2021-06-21 DIAGNOSIS — M7989 Other specified soft tissue disorders: Secondary | ICD-10-CM | POA: Diagnosis not present

## 2021-06-21 DIAGNOSIS — J4520 Mild intermittent asthma, uncomplicated: Secondary | ICD-10-CM | POA: Diagnosis not present

## 2021-06-21 DIAGNOSIS — M1000 Idiopathic gout, unspecified site: Secondary | ICD-10-CM | POA: Diagnosis not present

## 2021-06-21 DIAGNOSIS — M454 Ankylosing spondylitis of thoracic region: Secondary | ICD-10-CM | POA: Diagnosis not present

## 2021-06-22 DIAGNOSIS — D631 Anemia in chronic kidney disease: Secondary | ICD-10-CM | POA: Diagnosis not present

## 2021-06-22 DIAGNOSIS — Z992 Dependence on renal dialysis: Secondary | ICD-10-CM | POA: Diagnosis not present

## 2021-06-22 DIAGNOSIS — D689 Coagulation defect, unspecified: Secondary | ICD-10-CM | POA: Diagnosis not present

## 2021-06-22 DIAGNOSIS — N178 Other acute kidney failure: Secondary | ICD-10-CM | POA: Diagnosis not present

## 2021-06-22 DIAGNOSIS — Z7689 Persons encountering health services in other specified circumstances: Secondary | ICD-10-CM | POA: Diagnosis not present

## 2021-06-22 DIAGNOSIS — R52 Pain, unspecified: Secondary | ICD-10-CM | POA: Diagnosis not present

## 2021-06-22 DIAGNOSIS — Z23 Encounter for immunization: Secondary | ICD-10-CM | POA: Diagnosis not present

## 2021-06-22 DIAGNOSIS — T8249XA Other complication of vascular dialysis catheter, initial encounter: Secondary | ICD-10-CM | POA: Diagnosis not present

## 2021-06-24 ENCOUNTER — Other Ambulatory Visit: Payer: Self-pay | Admitting: *Deleted

## 2021-06-24 NOTE — Telephone Encounter (Signed)
Please change the rosuvastatin prescription to 20 mg once daily. ?

## 2021-06-24 NOTE — Telephone Encounter (Signed)
Wells Guiles, pharmacist from [facility] and stated that jardiance is contraindicated due to pt being on dialysis. She has already had the patient discontinue the jardiance and she recommend januvia or metformin as appropriate alternatives. She does not need a call back, she asks that recommendation be communicated with patients son, Nakayla Rorabaugh. She also stated that the patient requested a refill on rosuvastatin be sent to [facility]. ?

## 2021-06-25 DIAGNOSIS — T8249XA Other complication of vascular dialysis catheter, initial encounter: Secondary | ICD-10-CM | POA: Diagnosis not present

## 2021-06-25 DIAGNOSIS — R52 Pain, unspecified: Secondary | ICD-10-CM | POA: Diagnosis not present

## 2021-06-25 DIAGNOSIS — Z992 Dependence on renal dialysis: Secondary | ICD-10-CM | POA: Diagnosis not present

## 2021-06-25 DIAGNOSIS — D689 Coagulation defect, unspecified: Secondary | ICD-10-CM | POA: Diagnosis not present

## 2021-06-25 DIAGNOSIS — N178 Other acute kidney failure: Secondary | ICD-10-CM | POA: Diagnosis not present

## 2021-06-25 DIAGNOSIS — D631 Anemia in chronic kidney disease: Secondary | ICD-10-CM | POA: Diagnosis not present

## 2021-06-25 DIAGNOSIS — Z23 Encounter for immunization: Secondary | ICD-10-CM | POA: Diagnosis not present

## 2021-06-25 MED ORDER — ROSUVASTATIN CALCIUM 20 MG PO TABS: 20.0000 mg | ORAL_TABLET | Freq: Every evening | ORAL | 3 refills | Status: DC

## 2021-06-25 NOTE — Telephone Encounter (Signed)
Refill for rosuvastatin 20 sent to the pharmacy. Did you see the comment about the jardiance? ?

## 2021-06-25 NOTE — Telephone Encounter (Signed)
Yes. It is appropriate to stop Jardiance since she is ESRD. Defer DM therapy to PCP. ?

## 2021-06-26 DIAGNOSIS — I132 Hypertensive heart and chronic kidney disease with heart failure and with stage 5 chronic kidney disease, or end stage renal disease: Secondary | ICD-10-CM | POA: Diagnosis not present

## 2021-06-26 DIAGNOSIS — J449 Chronic obstructive pulmonary disease, unspecified: Secondary | ICD-10-CM | POA: Diagnosis not present

## 2021-06-26 DIAGNOSIS — E2740 Unspecified adrenocortical insufficiency: Secondary | ICD-10-CM | POA: Diagnosis not present

## 2021-06-26 DIAGNOSIS — M1000 Idiopathic gout, unspecified site: Secondary | ICD-10-CM | POA: Diagnosis not present

## 2021-06-26 DIAGNOSIS — M19072 Primary osteoarthritis, left ankle and foot: Secondary | ICD-10-CM | POA: Diagnosis not present

## 2021-06-26 DIAGNOSIS — G4700 Insomnia, unspecified: Secondary | ICD-10-CM | POA: Diagnosis not present

## 2021-06-26 DIAGNOSIS — J4520 Mild intermittent asthma, uncomplicated: Secondary | ICD-10-CM | POA: Diagnosis not present

## 2021-06-26 DIAGNOSIS — E782 Mixed hyperlipidemia: Secondary | ICD-10-CM | POA: Diagnosis not present

## 2021-06-26 DIAGNOSIS — E1122 Type 2 diabetes mellitus with diabetic chronic kidney disease: Secondary | ICD-10-CM | POA: Diagnosis not present

## 2021-06-26 DIAGNOSIS — I442 Atrioventricular block, complete: Secondary | ICD-10-CM | POA: Diagnosis not present

## 2021-06-26 DIAGNOSIS — N186 End stage renal disease: Secondary | ICD-10-CM | POA: Diagnosis not present

## 2021-06-26 DIAGNOSIS — I2510 Atherosclerotic heart disease of native coronary artery without angina pectoris: Secondary | ICD-10-CM | POA: Diagnosis not present

## 2021-06-26 DIAGNOSIS — G4733 Obstructive sleep apnea (adult) (pediatric): Secondary | ICD-10-CM | POA: Diagnosis not present

## 2021-06-26 DIAGNOSIS — I4891 Unspecified atrial fibrillation: Secondary | ICD-10-CM | POA: Diagnosis not present

## 2021-06-26 DIAGNOSIS — I9589 Other hypotension: Secondary | ICD-10-CM | POA: Diagnosis not present

## 2021-06-26 DIAGNOSIS — G8929 Other chronic pain: Secondary | ICD-10-CM | POA: Diagnosis not present

## 2021-06-26 DIAGNOSIS — M454 Ankylosing spondylitis of thoracic region: Secondary | ICD-10-CM | POA: Diagnosis not present

## 2021-06-26 DIAGNOSIS — H04123 Dry eye syndrome of bilateral lacrimal glands: Secondary | ICD-10-CM | POA: Diagnosis not present

## 2021-06-26 DIAGNOSIS — D631 Anemia in chronic kidney disease: Secondary | ICD-10-CM | POA: Diagnosis not present

## 2021-06-26 DIAGNOSIS — I5032 Chronic diastolic (congestive) heart failure: Secondary | ICD-10-CM | POA: Diagnosis not present

## 2021-06-26 DIAGNOSIS — S72142D Displaced intertrochanteric fracture of left femur, subsequent encounter for closed fracture with routine healing: Secondary | ICD-10-CM | POA: Diagnosis not present

## 2021-06-26 DIAGNOSIS — I429 Cardiomyopathy, unspecified: Secondary | ICD-10-CM | POA: Diagnosis not present

## 2021-06-26 DIAGNOSIS — J9621 Acute and chronic respiratory failure with hypoxia: Secondary | ICD-10-CM | POA: Diagnosis not present

## 2021-06-26 DIAGNOSIS — M7989 Other specified soft tissue disorders: Secondary | ICD-10-CM | POA: Diagnosis not present

## 2021-06-27 DIAGNOSIS — I132 Hypertensive heart and chronic kidney disease with heart failure and with stage 5 chronic kidney disease, or end stage renal disease: Secondary | ICD-10-CM | POA: Diagnosis not present

## 2021-06-27 DIAGNOSIS — J4520 Mild intermittent asthma, uncomplicated: Secondary | ICD-10-CM | POA: Diagnosis not present

## 2021-06-27 DIAGNOSIS — E1122 Type 2 diabetes mellitus with diabetic chronic kidney disease: Secondary | ICD-10-CM | POA: Diagnosis not present

## 2021-06-27 DIAGNOSIS — E2740 Unspecified adrenocortical insufficiency: Secondary | ICD-10-CM | POA: Diagnosis not present

## 2021-06-27 DIAGNOSIS — J9621 Acute and chronic respiratory failure with hypoxia: Secondary | ICD-10-CM | POA: Diagnosis not present

## 2021-06-27 DIAGNOSIS — G8929 Other chronic pain: Secondary | ICD-10-CM | POA: Diagnosis not present

## 2021-06-27 DIAGNOSIS — G4733 Obstructive sleep apnea (adult) (pediatric): Secondary | ICD-10-CM | POA: Diagnosis not present

## 2021-06-27 DIAGNOSIS — G4700 Insomnia, unspecified: Secondary | ICD-10-CM | POA: Diagnosis not present

## 2021-06-27 DIAGNOSIS — D631 Anemia in chronic kidney disease: Secondary | ICD-10-CM | POA: Diagnosis not present

## 2021-06-27 DIAGNOSIS — E782 Mixed hyperlipidemia: Secondary | ICD-10-CM | POA: Diagnosis not present

## 2021-06-27 DIAGNOSIS — I5032 Chronic diastolic (congestive) heart failure: Secondary | ICD-10-CM | POA: Diagnosis not present

## 2021-06-27 DIAGNOSIS — I442 Atrioventricular block, complete: Secondary | ICD-10-CM | POA: Diagnosis not present

## 2021-06-27 DIAGNOSIS — H04123 Dry eye syndrome of bilateral lacrimal glands: Secondary | ICD-10-CM | POA: Diagnosis not present

## 2021-06-27 DIAGNOSIS — I4891 Unspecified atrial fibrillation: Secondary | ICD-10-CM | POA: Diagnosis not present

## 2021-06-27 DIAGNOSIS — I429 Cardiomyopathy, unspecified: Secondary | ICD-10-CM | POA: Diagnosis not present

## 2021-06-27 DIAGNOSIS — J449 Chronic obstructive pulmonary disease, unspecified: Secondary | ICD-10-CM | POA: Diagnosis not present

## 2021-06-27 DIAGNOSIS — S72142D Displaced intertrochanteric fracture of left femur, subsequent encounter for closed fracture with routine healing: Secondary | ICD-10-CM | POA: Diagnosis not present

## 2021-06-27 DIAGNOSIS — I9589 Other hypotension: Secondary | ICD-10-CM | POA: Diagnosis not present

## 2021-06-27 DIAGNOSIS — I2510 Atherosclerotic heart disease of native coronary artery without angina pectoris: Secondary | ICD-10-CM | POA: Diagnosis not present

## 2021-06-27 DIAGNOSIS — M454 Ankylosing spondylitis of thoracic region: Secondary | ICD-10-CM | POA: Diagnosis not present

## 2021-06-27 DIAGNOSIS — M7989 Other specified soft tissue disorders: Secondary | ICD-10-CM | POA: Diagnosis not present

## 2021-06-27 DIAGNOSIS — M1000 Idiopathic gout, unspecified site: Secondary | ICD-10-CM | POA: Diagnosis not present

## 2021-06-27 DIAGNOSIS — M19072 Primary osteoarthritis, left ankle and foot: Secondary | ICD-10-CM | POA: Diagnosis not present

## 2021-06-27 DIAGNOSIS — N186 End stage renal disease: Secondary | ICD-10-CM | POA: Diagnosis not present

## 2021-06-28 ENCOUNTER — Telehealth: Payer: Self-pay | Admitting: Orthopaedic Surgery

## 2021-06-28 NOTE — Telephone Encounter (Signed)
[name] had hip surgery and is now going thru PT. She is experiencing increased hip pain and wants to know if this is normal or if she should come in to see Dr. Jacinto Reap. Please advise.

## 2021-06-29 DIAGNOSIS — Z992 Dependence on renal dialysis: Secondary | ICD-10-CM | POA: Diagnosis not present

## 2021-06-29 DIAGNOSIS — R52 Pain, unspecified: Secondary | ICD-10-CM | POA: Diagnosis not present

## 2021-06-29 DIAGNOSIS — N178 Other acute kidney failure: Secondary | ICD-10-CM | POA: Diagnosis not present

## 2021-06-29 DIAGNOSIS — D631 Anemia in chronic kidney disease: Secondary | ICD-10-CM | POA: Diagnosis not present

## 2021-06-29 DIAGNOSIS — Z23 Encounter for immunization: Secondary | ICD-10-CM | POA: Diagnosis not present

## 2021-06-29 DIAGNOSIS — D689 Coagulation defect, unspecified: Secondary | ICD-10-CM | POA: Diagnosis not present

## 2021-06-29 DIAGNOSIS — T8249XA Other complication of vascular dialysis catheter, initial encounter: Secondary | ICD-10-CM | POA: Diagnosis not present

## 2021-07-01 ENCOUNTER — Telehealth: Payer: Self-pay | Admitting: Physician Assistant

## 2021-07-01 DIAGNOSIS — I9589 Other hypotension: Secondary | ICD-10-CM | POA: Diagnosis not present

## 2021-07-01 DIAGNOSIS — M19072 Primary osteoarthritis, left ankle and foot: Secondary | ICD-10-CM | POA: Diagnosis not present

## 2021-07-01 DIAGNOSIS — E782 Mixed hyperlipidemia: Secondary | ICD-10-CM | POA: Diagnosis not present

## 2021-07-01 DIAGNOSIS — J449 Chronic obstructive pulmonary disease, unspecified: Secondary | ICD-10-CM | POA: Diagnosis not present

## 2021-07-01 DIAGNOSIS — H04123 Dry eye syndrome of bilateral lacrimal glands: Secondary | ICD-10-CM | POA: Diagnosis not present

## 2021-07-01 DIAGNOSIS — M7989 Other specified soft tissue disorders: Secondary | ICD-10-CM | POA: Diagnosis not present

## 2021-07-01 DIAGNOSIS — E1122 Type 2 diabetes mellitus with diabetic chronic kidney disease: Secondary | ICD-10-CM | POA: Diagnosis not present

## 2021-07-01 DIAGNOSIS — I442 Atrioventricular block, complete: Secondary | ICD-10-CM | POA: Diagnosis not present

## 2021-07-01 DIAGNOSIS — M1000 Idiopathic gout, unspecified site: Secondary | ICD-10-CM | POA: Diagnosis not present

## 2021-07-01 DIAGNOSIS — I4891 Unspecified atrial fibrillation: Secondary | ICD-10-CM | POA: Diagnosis not present

## 2021-07-01 DIAGNOSIS — N186 End stage renal disease: Secondary | ICD-10-CM | POA: Diagnosis not present

## 2021-07-01 DIAGNOSIS — G8929 Other chronic pain: Secondary | ICD-10-CM | POA: Diagnosis not present

## 2021-07-01 DIAGNOSIS — I2510 Atherosclerotic heart disease of native coronary artery without angina pectoris: Secondary | ICD-10-CM | POA: Diagnosis not present

## 2021-07-01 DIAGNOSIS — I132 Hypertensive heart and chronic kidney disease with heart failure and with stage 5 chronic kidney disease, or end stage renal disease: Secondary | ICD-10-CM | POA: Diagnosis not present

## 2021-07-01 DIAGNOSIS — E2740 Unspecified adrenocortical insufficiency: Secondary | ICD-10-CM | POA: Diagnosis not present

## 2021-07-01 DIAGNOSIS — J9621 Acute and chronic respiratory failure with hypoxia: Secondary | ICD-10-CM | POA: Diagnosis not present

## 2021-07-01 DIAGNOSIS — G4700 Insomnia, unspecified: Secondary | ICD-10-CM | POA: Diagnosis not present

## 2021-07-01 DIAGNOSIS — G4733 Obstructive sleep apnea (adult) (pediatric): Secondary | ICD-10-CM | POA: Diagnosis not present

## 2021-07-01 DIAGNOSIS — I5032 Chronic diastolic (congestive) heart failure: Secondary | ICD-10-CM | POA: Diagnosis not present

## 2021-07-01 DIAGNOSIS — M454 Ankylosing spondylitis of thoracic region: Secondary | ICD-10-CM | POA: Diagnosis not present

## 2021-07-01 DIAGNOSIS — I429 Cardiomyopathy, unspecified: Secondary | ICD-10-CM | POA: Diagnosis not present

## 2021-07-01 DIAGNOSIS — S72142D Displaced intertrochanteric fracture of left femur, subsequent encounter for closed fracture with routine healing: Secondary | ICD-10-CM | POA: Diagnosis not present

## 2021-07-01 DIAGNOSIS — J4520 Mild intermittent asthma, uncomplicated: Secondary | ICD-10-CM | POA: Diagnosis not present

## 2021-07-01 DIAGNOSIS — D631 Anemia in chronic kidney disease: Secondary | ICD-10-CM | POA: Diagnosis not present

## 2021-07-01 NOTE — Telephone Encounter (Signed)
Patient called requesting to speak with nurse regarding abd pain and diarrhea. Per patient, no longer in a nursing home. Please advise.

## 2021-07-01 NOTE — Telephone Encounter (Signed)
Patient is currently taking tramadol & hydrocodone. She is not taking a fiber supplement, and is unsure if she takes linzess. I have left a message for son to call back since he handles her medications. Patient is aware to come in for KUB to further evaluate symptoms.

## 2021-07-01 NOTE — Telephone Encounter (Signed)
Patient called in with complaints of a constant generalized abdominal pain (7/10) that radiates to her back & irregular bowel movements. She said in the last couple of weeks she has been experiencing "blow outs." She believes she has been taking medication as prescribed (recently left nursing home & now son handles her medications). She was last seen for OV with Estill Bamberg, [place] on 05/27/21. Will route to PA for further recommendations.

## 2021-07-02 ENCOUNTER — Ambulatory Visit (INDEPENDENT_AMBULATORY_CARE_PROVIDER_SITE_OTHER): Payer: Medicare Other

## 2021-07-02 ENCOUNTER — Telehealth: Payer: Self-pay

## 2021-07-02 DIAGNOSIS — N179 Acute kidney failure, unspecified: Secondary | ICD-10-CM | POA: Diagnosis not present

## 2021-07-02 DIAGNOSIS — D631 Anemia in chronic kidney disease: Secondary | ICD-10-CM | POA: Diagnosis not present

## 2021-07-02 DIAGNOSIS — Z992 Dependence on renal dialysis: Secondary | ICD-10-CM | POA: Diagnosis not present

## 2021-07-02 DIAGNOSIS — I442 Atrioventricular block, complete: Secondary | ICD-10-CM

## 2021-07-02 DIAGNOSIS — N178 Other acute kidney failure: Secondary | ICD-10-CM | POA: Diagnosis not present

## 2021-07-02 DIAGNOSIS — T8249XA Other complication of vascular dialysis catheter, initial encounter: Secondary | ICD-10-CM | POA: Diagnosis not present

## 2021-07-02 DIAGNOSIS — Z23 Encounter for immunization: Secondary | ICD-10-CM | POA: Diagnosis not present

## 2021-07-02 DIAGNOSIS — R52 Pain, unspecified: Secondary | ICD-10-CM | POA: Diagnosis not present

## 2021-07-02 DIAGNOSIS — D689 Coagulation defect, unspecified: Secondary | ICD-10-CM | POA: Diagnosis not present

## 2021-07-02 LAB — CUP PACEART REMOTE DEVICE CHECK
Battery Remaining Longevity: 90 mo
Battery Remaining Percentage: 96 %
Brady Statistic RA Percent Paced: 0 %
Brady Statistic RV Percent Paced: 95 %
Date Time Interrogation Session: 20230523011100
Implantable Lead Implant Date: 20080923
Implantable Lead Implant Date: 20080923
Implantable Lead Implant Date: 20080923
Implantable Lead Location: 753858
Implantable Lead Location: 753859
Implantable Lead Location: 753860
Implantable Lead Model: 4285
Implantable Lead Model: 4555
Implantable Lead Model: 7120
Implantable Lead Serial Number: 170220
Implantable Lead Serial Number: 486468
Implantable Pulse Generator Implant Date: 20221121
Lead Channel Impedance Value: 614 Ohm
Lead Channel Impedance Value: 796 Ohm
Lead Channel Impedance Value: 928 Ohm
Lead Channel Pacing Threshold Amplitude: 0.7 V
Lead Channel Pacing Threshold Pulse Width: 0.4 ms
Lead Channel Setting Pacing Amplitude: 2 V
Lead Channel Setting Pacing Amplitude: 3.5 V
Lead Channel Setting Pacing Pulse Width: 0.4 ms
Lead Channel Setting Pacing Pulse Width: 1 ms
Lead Channel Setting Sensing Sensitivity: 3 mV
Lead Channel Setting Sensing Sensitivity: 3 mV
Pulse Gen Serial Number: 720887

## 2021-07-02 NOTE — Telephone Encounter (Signed)
Spoke with patient's son [name] & he confirmed that patient does take Linzess daily, as well as tramadol & hydrocodone as needed. He will have transportation arrange so that patient can come in for KUB to evaluate further.

## 2021-07-02 NOTE — Telephone Encounter (Signed)
Scheduled remote reviewed. Normal device function.   AF burden 3%, longest episode 34hrs on 04/10/21 (no EGM for this episode, overwritten).  EGM's show both AT and AFib (see [phone]). No OAC currently (on ASA/Plavix). Routing for further review of long episode  Routing to Dr. [name] as [name]

## 2021-07-03 ENCOUNTER — Ambulatory Visit: Payer: Medicare Other | Admitting: Podiatry

## 2021-07-03 DIAGNOSIS — E2740 Unspecified adrenocortical insufficiency: Secondary | ICD-10-CM | POA: Diagnosis not present

## 2021-07-03 DIAGNOSIS — G4700 Insomnia, unspecified: Secondary | ICD-10-CM | POA: Diagnosis not present

## 2021-07-03 DIAGNOSIS — G8929 Other chronic pain: Secondary | ICD-10-CM | POA: Diagnosis not present

## 2021-07-03 DIAGNOSIS — J4520 Mild intermittent asthma, uncomplicated: Secondary | ICD-10-CM | POA: Diagnosis not present

## 2021-07-03 DIAGNOSIS — I9589 Other hypotension: Secondary | ICD-10-CM | POA: Diagnosis not present

## 2021-07-03 DIAGNOSIS — I442 Atrioventricular block, complete: Secondary | ICD-10-CM | POA: Diagnosis not present

## 2021-07-03 DIAGNOSIS — M1000 Idiopathic gout, unspecified site: Secondary | ICD-10-CM | POA: Diagnosis not present

## 2021-07-03 DIAGNOSIS — I5032 Chronic diastolic (congestive) heart failure: Secondary | ICD-10-CM | POA: Diagnosis not present

## 2021-07-03 DIAGNOSIS — J449 Chronic obstructive pulmonary disease, unspecified: Secondary | ICD-10-CM | POA: Diagnosis not present

## 2021-07-03 DIAGNOSIS — H04123 Dry eye syndrome of bilateral lacrimal glands: Secondary | ICD-10-CM | POA: Diagnosis not present

## 2021-07-03 DIAGNOSIS — J9621 Acute and chronic respiratory failure with hypoxia: Secondary | ICD-10-CM | POA: Diagnosis not present

## 2021-07-03 DIAGNOSIS — E782 Mixed hyperlipidemia: Secondary | ICD-10-CM | POA: Diagnosis not present

## 2021-07-03 DIAGNOSIS — I429 Cardiomyopathy, unspecified: Secondary | ICD-10-CM | POA: Diagnosis not present

## 2021-07-03 DIAGNOSIS — I4891 Unspecified atrial fibrillation: Secondary | ICD-10-CM | POA: Diagnosis not present

## 2021-07-03 DIAGNOSIS — I2510 Atherosclerotic heart disease of native coronary artery without angina pectoris: Secondary | ICD-10-CM | POA: Diagnosis not present

## 2021-07-03 DIAGNOSIS — E1122 Type 2 diabetes mellitus with diabetic chronic kidney disease: Secondary | ICD-10-CM | POA: Diagnosis not present

## 2021-07-03 DIAGNOSIS — I132 Hypertensive heart and chronic kidney disease with heart failure and with stage 5 chronic kidney disease, or end stage renal disease: Secondary | ICD-10-CM | POA: Diagnosis not present

## 2021-07-03 DIAGNOSIS — M454 Ankylosing spondylitis of thoracic region: Secondary | ICD-10-CM | POA: Diagnosis not present

## 2021-07-03 DIAGNOSIS — D631 Anemia in chronic kidney disease: Secondary | ICD-10-CM | POA: Diagnosis not present

## 2021-07-03 DIAGNOSIS — N186 End stage renal disease: Secondary | ICD-10-CM | POA: Diagnosis not present

## 2021-07-03 DIAGNOSIS — M7989 Other specified soft tissue disorders: Secondary | ICD-10-CM | POA: Diagnosis not present

## 2021-07-03 DIAGNOSIS — S72142D Displaced intertrochanteric fracture of left femur, subsequent encounter for closed fracture with routine healing: Secondary | ICD-10-CM | POA: Diagnosis not present

## 2021-07-03 DIAGNOSIS — M19072 Primary osteoarthritis, left ankle and foot: Secondary | ICD-10-CM | POA: Diagnosis not present

## 2021-07-03 DIAGNOSIS — G4733 Obstructive sleep apnea (adult) (pediatric): Secondary | ICD-10-CM | POA: Diagnosis not present

## 2021-07-03 NOTE — Telephone Encounter (Signed)
Spoke with patient regarding PA recommendations. Patient verbalized understanding, no further questions.

## 2021-07-03 NOTE — Telephone Encounter (Signed)
Thank you, Portia.  [name], I have not seen her in clinic since her long hospitalization and starting dialysis. Can we please schedule an appointment?

## 2021-07-04 DIAGNOSIS — N178 Other acute kidney failure: Secondary | ICD-10-CM | POA: Diagnosis not present

## 2021-07-04 DIAGNOSIS — R52 Pain, unspecified: Secondary | ICD-10-CM | POA: Diagnosis not present

## 2021-07-04 DIAGNOSIS — D689 Coagulation defect, unspecified: Secondary | ICD-10-CM | POA: Diagnosis not present

## 2021-07-04 DIAGNOSIS — Z992 Dependence on renal dialysis: Secondary | ICD-10-CM | POA: Diagnosis not present

## 2021-07-04 DIAGNOSIS — Z23 Encounter for immunization: Secondary | ICD-10-CM | POA: Diagnosis not present

## 2021-07-04 DIAGNOSIS — T8249XA Other complication of vascular dialysis catheter, initial encounter: Secondary | ICD-10-CM | POA: Diagnosis not present

## 2021-07-04 DIAGNOSIS — D631 Anemia in chronic kidney disease: Secondary | ICD-10-CM | POA: Diagnosis not present

## 2021-07-04 NOTE — Progress Notes (Signed)
Reviewed and agree with documentation and assessment and plan. K. Veena Donathan Buller , MD   

## 2021-07-05 DIAGNOSIS — G8929 Other chronic pain: Secondary | ICD-10-CM | POA: Diagnosis not present

## 2021-07-05 DIAGNOSIS — E2740 Unspecified adrenocortical insufficiency: Secondary | ICD-10-CM | POA: Diagnosis not present

## 2021-07-05 DIAGNOSIS — M1000 Idiopathic gout, unspecified site: Secondary | ICD-10-CM | POA: Diagnosis not present

## 2021-07-05 DIAGNOSIS — M454 Ankylosing spondylitis of thoracic region: Secondary | ICD-10-CM | POA: Diagnosis not present

## 2021-07-05 DIAGNOSIS — J449 Chronic obstructive pulmonary disease, unspecified: Secondary | ICD-10-CM | POA: Diagnosis not present

## 2021-07-05 DIAGNOSIS — N186 End stage renal disease: Secondary | ICD-10-CM | POA: Diagnosis not present

## 2021-07-05 DIAGNOSIS — H04123 Dry eye syndrome of bilateral lacrimal glands: Secondary | ICD-10-CM | POA: Diagnosis not present

## 2021-07-05 DIAGNOSIS — I4891 Unspecified atrial fibrillation: Secondary | ICD-10-CM | POA: Diagnosis not present

## 2021-07-05 DIAGNOSIS — I5032 Chronic diastolic (congestive) heart failure: Secondary | ICD-10-CM | POA: Diagnosis not present

## 2021-07-05 DIAGNOSIS — E1122 Type 2 diabetes mellitus with diabetic chronic kidney disease: Secondary | ICD-10-CM | POA: Diagnosis not present

## 2021-07-05 DIAGNOSIS — I442 Atrioventricular block, complete: Secondary | ICD-10-CM | POA: Diagnosis not present

## 2021-07-05 DIAGNOSIS — M19072 Primary osteoarthritis, left ankle and foot: Secondary | ICD-10-CM | POA: Diagnosis not present

## 2021-07-05 DIAGNOSIS — E782 Mixed hyperlipidemia: Secondary | ICD-10-CM | POA: Diagnosis not present

## 2021-07-05 DIAGNOSIS — S72142D Displaced intertrochanteric fracture of left femur, subsequent encounter for closed fracture with routine healing: Secondary | ICD-10-CM | POA: Diagnosis not present

## 2021-07-05 DIAGNOSIS — J9621 Acute and chronic respiratory failure with hypoxia: Secondary | ICD-10-CM | POA: Diagnosis not present

## 2021-07-05 DIAGNOSIS — M7989 Other specified soft tissue disorders: Secondary | ICD-10-CM | POA: Diagnosis not present

## 2021-07-05 DIAGNOSIS — I9589 Other hypotension: Secondary | ICD-10-CM | POA: Diagnosis not present

## 2021-07-05 DIAGNOSIS — I429 Cardiomyopathy, unspecified: Secondary | ICD-10-CM | POA: Diagnosis not present

## 2021-07-05 DIAGNOSIS — J4520 Mild intermittent asthma, uncomplicated: Secondary | ICD-10-CM | POA: Diagnosis not present

## 2021-07-05 DIAGNOSIS — I132 Hypertensive heart and chronic kidney disease with heart failure and with stage 5 chronic kidney disease, or end stage renal disease: Secondary | ICD-10-CM | POA: Diagnosis not present

## 2021-07-05 DIAGNOSIS — G4733 Obstructive sleep apnea (adult) (pediatric): Secondary | ICD-10-CM | POA: Diagnosis not present

## 2021-07-05 DIAGNOSIS — D631 Anemia in chronic kidney disease: Secondary | ICD-10-CM | POA: Diagnosis not present

## 2021-07-05 DIAGNOSIS — G4700 Insomnia, unspecified: Secondary | ICD-10-CM | POA: Diagnosis not present

## 2021-07-05 DIAGNOSIS — I2510 Atherosclerotic heart disease of native coronary artery without angina pectoris: Secondary | ICD-10-CM | POA: Diagnosis not present

## 2021-07-06 DIAGNOSIS — J9612 Chronic respiratory failure with hypercapnia: Secondary | ICD-10-CM | POA: Diagnosis not present

## 2021-07-06 DIAGNOSIS — J9621 Acute and chronic respiratory failure with hypoxia: Secondary | ICD-10-CM | POA: Diagnosis not present

## 2021-07-06 DIAGNOSIS — M19072 Primary osteoarthritis, left ankle and foot: Secondary | ICD-10-CM | POA: Diagnosis not present

## 2021-07-06 DIAGNOSIS — N1832 Chronic kidney disease, stage 3b: Secondary | ICD-10-CM | POA: Diagnosis not present

## 2021-07-06 DIAGNOSIS — M6281 Muscle weakness (generalized): Secondary | ICD-10-CM | POA: Diagnosis not present

## 2021-07-06 DIAGNOSIS — I5022 Chronic systolic (congestive) heart failure: Secondary | ICD-10-CM | POA: Diagnosis not present

## 2021-07-06 DIAGNOSIS — J9611 Chronic respiratory failure with hypoxia: Secondary | ICD-10-CM | POA: Diagnosis not present

## 2021-07-10 ENCOUNTER — Ambulatory Visit: Payer: Medicare Other | Admitting: Podiatry

## 2021-07-10 DIAGNOSIS — N186 End stage renal disease: Secondary | ICD-10-CM | POA: Diagnosis not present

## 2021-07-10 DIAGNOSIS — I5032 Chronic diastolic (congestive) heart failure: Secondary | ICD-10-CM | POA: Diagnosis not present

## 2021-07-10 DIAGNOSIS — E1122 Type 2 diabetes mellitus with diabetic chronic kidney disease: Secondary | ICD-10-CM | POA: Diagnosis not present

## 2021-07-10 DIAGNOSIS — G4700 Insomnia, unspecified: Secondary | ICD-10-CM | POA: Diagnosis not present

## 2021-07-10 DIAGNOSIS — J9621 Acute and chronic respiratory failure with hypoxia: Secondary | ICD-10-CM | POA: Diagnosis not present

## 2021-07-10 DIAGNOSIS — G8929 Other chronic pain: Secondary | ICD-10-CM | POA: Diagnosis not present

## 2021-07-10 DIAGNOSIS — J449 Chronic obstructive pulmonary disease, unspecified: Secondary | ICD-10-CM | POA: Diagnosis not present

## 2021-07-10 DIAGNOSIS — I9589 Other hypotension: Secondary | ICD-10-CM | POA: Diagnosis not present

## 2021-07-10 DIAGNOSIS — M7989 Other specified soft tissue disorders: Secondary | ICD-10-CM | POA: Diagnosis not present

## 2021-07-10 DIAGNOSIS — I2510 Atherosclerotic heart disease of native coronary artery without angina pectoris: Secondary | ICD-10-CM | POA: Diagnosis not present

## 2021-07-10 DIAGNOSIS — E2740 Unspecified adrenocortical insufficiency: Secondary | ICD-10-CM | POA: Diagnosis not present

## 2021-07-10 DIAGNOSIS — M1000 Idiopathic gout, unspecified site: Secondary | ICD-10-CM | POA: Diagnosis not present

## 2021-07-10 DIAGNOSIS — I442 Atrioventricular block, complete: Secondary | ICD-10-CM | POA: Diagnosis not present

## 2021-07-10 DIAGNOSIS — D631 Anemia in chronic kidney disease: Secondary | ICD-10-CM | POA: Diagnosis not present

## 2021-07-10 DIAGNOSIS — J4520 Mild intermittent asthma, uncomplicated: Secondary | ICD-10-CM | POA: Diagnosis not present

## 2021-07-10 DIAGNOSIS — M19072 Primary osteoarthritis, left ankle and foot: Secondary | ICD-10-CM | POA: Diagnosis not present

## 2021-07-10 DIAGNOSIS — M454 Ankylosing spondylitis of thoracic region: Secondary | ICD-10-CM | POA: Diagnosis not present

## 2021-07-10 DIAGNOSIS — I429 Cardiomyopathy, unspecified: Secondary | ICD-10-CM | POA: Diagnosis not present

## 2021-07-10 DIAGNOSIS — S72142D Displaced intertrochanteric fracture of left femur, subsequent encounter for closed fracture with routine healing: Secondary | ICD-10-CM | POA: Diagnosis not present

## 2021-07-10 DIAGNOSIS — I132 Hypertensive heart and chronic kidney disease with heart failure and with stage 5 chronic kidney disease, or end stage renal disease: Secondary | ICD-10-CM | POA: Diagnosis not present

## 2021-07-10 DIAGNOSIS — I4891 Unspecified atrial fibrillation: Secondary | ICD-10-CM | POA: Diagnosis not present

## 2021-07-10 DIAGNOSIS — H04123 Dry eye syndrome of bilateral lacrimal glands: Secondary | ICD-10-CM | POA: Diagnosis not present

## 2021-07-10 DIAGNOSIS — E782 Mixed hyperlipidemia: Secondary | ICD-10-CM | POA: Diagnosis not present

## 2021-07-10 DIAGNOSIS — G4733 Obstructive sleep apnea (adult) (pediatric): Secondary | ICD-10-CM | POA: Diagnosis not present

## 2021-07-10 NOTE — Telephone Encounter (Signed)
Patient has an appointment on 6/5 with Dr. [name]

## 2021-07-11 DIAGNOSIS — E1122 Type 2 diabetes mellitus with diabetic chronic kidney disease: Secondary | ICD-10-CM | POA: Diagnosis not present

## 2021-07-11 DIAGNOSIS — N184 Chronic kidney disease, stage 4 (severe): Secondary | ICD-10-CM | POA: Diagnosis not present

## 2021-07-11 DIAGNOSIS — I442 Atrioventricular block, complete: Secondary | ICD-10-CM | POA: Diagnosis not present

## 2021-07-11 DIAGNOSIS — H04123 Dry eye syndrome of bilateral lacrimal glands: Secondary | ICD-10-CM | POA: Diagnosis not present

## 2021-07-11 DIAGNOSIS — I5032 Chronic diastolic (congestive) heart failure: Secondary | ICD-10-CM | POA: Diagnosis not present

## 2021-07-11 DIAGNOSIS — G8929 Other chronic pain: Secondary | ICD-10-CM | POA: Diagnosis not present

## 2021-07-11 DIAGNOSIS — M454 Ankylosing spondylitis of thoracic region: Secondary | ICD-10-CM | POA: Diagnosis not present

## 2021-07-11 DIAGNOSIS — I429 Cardiomyopathy, unspecified: Secondary | ICD-10-CM | POA: Diagnosis not present

## 2021-07-11 DIAGNOSIS — I2510 Atherosclerotic heart disease of native coronary artery without angina pectoris: Secondary | ICD-10-CM | POA: Diagnosis not present

## 2021-07-11 DIAGNOSIS — G4700 Insomnia, unspecified: Secondary | ICD-10-CM | POA: Diagnosis not present

## 2021-07-11 DIAGNOSIS — M7989 Other specified soft tissue disorders: Secondary | ICD-10-CM | POA: Diagnosis not present

## 2021-07-11 DIAGNOSIS — I4891 Unspecified atrial fibrillation: Secondary | ICD-10-CM | POA: Diagnosis not present

## 2021-07-11 DIAGNOSIS — E2740 Unspecified adrenocortical insufficiency: Secondary | ICD-10-CM | POA: Diagnosis not present

## 2021-07-11 DIAGNOSIS — N2581 Secondary hyperparathyroidism of renal origin: Secondary | ICD-10-CM | POA: Diagnosis not present

## 2021-07-11 DIAGNOSIS — M19072 Primary osteoarthritis, left ankle and foot: Secondary | ICD-10-CM | POA: Diagnosis not present

## 2021-07-11 DIAGNOSIS — N186 End stage renal disease: Secondary | ICD-10-CM | POA: Diagnosis not present

## 2021-07-11 DIAGNOSIS — J4520 Mild intermittent asthma, uncomplicated: Secondary | ICD-10-CM | POA: Diagnosis not present

## 2021-07-11 DIAGNOSIS — D631 Anemia in chronic kidney disease: Secondary | ICD-10-CM | POA: Diagnosis not present

## 2021-07-11 DIAGNOSIS — S72142D Displaced intertrochanteric fracture of left femur, subsequent encounter for closed fracture with routine healing: Secondary | ICD-10-CM | POA: Diagnosis not present

## 2021-07-11 DIAGNOSIS — J449 Chronic obstructive pulmonary disease, unspecified: Secondary | ICD-10-CM | POA: Diagnosis not present

## 2021-07-11 DIAGNOSIS — E782 Mixed hyperlipidemia: Secondary | ICD-10-CM | POA: Diagnosis not present

## 2021-07-11 DIAGNOSIS — N1832 Chronic kidney disease, stage 3b: Secondary | ICD-10-CM | POA: Diagnosis not present

## 2021-07-11 DIAGNOSIS — M1000 Idiopathic gout, unspecified site: Secondary | ICD-10-CM | POA: Diagnosis not present

## 2021-07-11 DIAGNOSIS — I9589 Other hypotension: Secondary | ICD-10-CM | POA: Diagnosis not present

## 2021-07-11 DIAGNOSIS — I132 Hypertensive heart and chronic kidney disease with heart failure and with stage 5 chronic kidney disease, or end stage renal disease: Secondary | ICD-10-CM | POA: Diagnosis not present

## 2021-07-11 DIAGNOSIS — I129 Hypertensive chronic kidney disease with stage 1 through stage 4 chronic kidney disease, or unspecified chronic kidney disease: Secondary | ICD-10-CM | POA: Diagnosis not present

## 2021-07-11 DIAGNOSIS — G4733 Obstructive sleep apnea (adult) (pediatric): Secondary | ICD-10-CM | POA: Diagnosis not present

## 2021-07-11 DIAGNOSIS — J9621 Acute and chronic respiratory failure with hypoxia: Secondary | ICD-10-CM | POA: Diagnosis not present

## 2021-07-12 ENCOUNTER — Ambulatory Visit: Payer: Medicare Other | Admitting: Podiatry

## 2021-07-15 ENCOUNTER — Encounter: Payer: Medicare Other | Admitting: Cardiovascular Disease

## 2021-07-17 DIAGNOSIS — M454 Ankylosing spondylitis of thoracic region: Secondary | ICD-10-CM | POA: Diagnosis not present

## 2021-07-17 DIAGNOSIS — I9589 Other hypotension: Secondary | ICD-10-CM | POA: Diagnosis not present

## 2021-07-17 DIAGNOSIS — G4733 Obstructive sleep apnea (adult) (pediatric): Secondary | ICD-10-CM | POA: Diagnosis not present

## 2021-07-17 DIAGNOSIS — N186 End stage renal disease: Secondary | ICD-10-CM | POA: Diagnosis not present

## 2021-07-17 DIAGNOSIS — J449 Chronic obstructive pulmonary disease, unspecified: Secondary | ICD-10-CM | POA: Diagnosis not present

## 2021-07-17 DIAGNOSIS — E2740 Unspecified adrenocortical insufficiency: Secondary | ICD-10-CM | POA: Diagnosis not present

## 2021-07-17 DIAGNOSIS — M7989 Other specified soft tissue disorders: Secondary | ICD-10-CM | POA: Diagnosis not present

## 2021-07-17 DIAGNOSIS — I442 Atrioventricular block, complete: Secondary | ICD-10-CM | POA: Diagnosis not present

## 2021-07-17 DIAGNOSIS — I4891 Unspecified atrial fibrillation: Secondary | ICD-10-CM | POA: Diagnosis not present

## 2021-07-17 DIAGNOSIS — J4520 Mild intermittent asthma, uncomplicated: Secondary | ICD-10-CM | POA: Diagnosis not present

## 2021-07-17 DIAGNOSIS — I2510 Atherosclerotic heart disease of native coronary artery without angina pectoris: Secondary | ICD-10-CM | POA: Diagnosis not present

## 2021-07-17 DIAGNOSIS — L0390 Cellulitis, unspecified: Secondary | ICD-10-CM | POA: Diagnosis not present

## 2021-07-17 DIAGNOSIS — M1000 Idiopathic gout, unspecified site: Secondary | ICD-10-CM | POA: Diagnosis not present

## 2021-07-17 DIAGNOSIS — M19072 Primary osteoarthritis, left ankle and foot: Secondary | ICD-10-CM | POA: Diagnosis not present

## 2021-07-17 DIAGNOSIS — D631 Anemia in chronic kidney disease: Secondary | ICD-10-CM | POA: Diagnosis not present

## 2021-07-17 DIAGNOSIS — H04123 Dry eye syndrome of bilateral lacrimal glands: Secondary | ICD-10-CM | POA: Diagnosis not present

## 2021-07-17 DIAGNOSIS — J9621 Acute and chronic respiratory failure with hypoxia: Secondary | ICD-10-CM | POA: Diagnosis not present

## 2021-07-17 DIAGNOSIS — E782 Mixed hyperlipidemia: Secondary | ICD-10-CM | POA: Diagnosis not present

## 2021-07-17 DIAGNOSIS — G4700 Insomnia, unspecified: Secondary | ICD-10-CM | POA: Diagnosis not present

## 2021-07-17 DIAGNOSIS — I429 Cardiomyopathy, unspecified: Secondary | ICD-10-CM | POA: Diagnosis not present

## 2021-07-17 DIAGNOSIS — E1122 Type 2 diabetes mellitus with diabetic chronic kidney disease: Secondary | ICD-10-CM | POA: Diagnosis not present

## 2021-07-17 DIAGNOSIS — I132 Hypertensive heart and chronic kidney disease with heart failure and with stage 5 chronic kidney disease, or end stage renal disease: Secondary | ICD-10-CM | POA: Diagnosis not present

## 2021-07-17 DIAGNOSIS — G8929 Other chronic pain: Secondary | ICD-10-CM | POA: Diagnosis not present

## 2021-07-17 DIAGNOSIS — I5032 Chronic diastolic (congestive) heart failure: Secondary | ICD-10-CM | POA: Diagnosis not present

## 2021-07-17 DIAGNOSIS — S72142D Displaced intertrochanteric fracture of left femur, subsequent encounter for closed fracture with routine healing: Secondary | ICD-10-CM | POA: Diagnosis not present

## 2021-07-17 NOTE — Progress Notes (Signed)
Remote pacemaker transmission.   

## 2021-07-19 ENCOUNTER — Ambulatory Visit (INDEPENDENT_AMBULATORY_CARE_PROVIDER_SITE_OTHER): Payer: Medicare Other | Admitting: Podiatry

## 2021-07-19 ENCOUNTER — Encounter: Payer: Self-pay | Admitting: Podiatry

## 2021-07-19 DIAGNOSIS — E1122 Type 2 diabetes mellitus with diabetic chronic kidney disease: Secondary | ICD-10-CM | POA: Diagnosis not present

## 2021-07-19 DIAGNOSIS — L84 Corns and callosities: Secondary | ICD-10-CM | POA: Diagnosis not present

## 2021-07-19 DIAGNOSIS — B351 Tinea unguium: Secondary | ICD-10-CM | POA: Diagnosis not present

## 2021-07-19 DIAGNOSIS — E782 Mixed hyperlipidemia: Secondary | ICD-10-CM | POA: Diagnosis not present

## 2021-07-19 DIAGNOSIS — M454 Ankylosing spondylitis of thoracic region: Secondary | ICD-10-CM | POA: Diagnosis not present

## 2021-07-19 DIAGNOSIS — S72142D Displaced intertrochanteric fracture of left femur, subsequent encounter for closed fracture with routine healing: Secondary | ICD-10-CM | POA: Diagnosis not present

## 2021-07-19 DIAGNOSIS — J4520 Mild intermittent asthma, uncomplicated: Secondary | ICD-10-CM | POA: Diagnosis not present

## 2021-07-19 DIAGNOSIS — I442 Atrioventricular block, complete: Secondary | ICD-10-CM | POA: Diagnosis not present

## 2021-07-19 DIAGNOSIS — J449 Chronic obstructive pulmonary disease, unspecified: Secondary | ICD-10-CM | POA: Diagnosis not present

## 2021-07-19 DIAGNOSIS — M79674 Pain in right toe(s): Secondary | ICD-10-CM

## 2021-07-19 DIAGNOSIS — G8929 Other chronic pain: Secondary | ICD-10-CM | POA: Diagnosis not present

## 2021-07-19 DIAGNOSIS — E2740 Unspecified adrenocortical insufficiency: Secondary | ICD-10-CM | POA: Diagnosis not present

## 2021-07-19 DIAGNOSIS — D631 Anemia in chronic kidney disease: Secondary | ICD-10-CM | POA: Diagnosis not present

## 2021-07-19 DIAGNOSIS — I9589 Other hypotension: Secondary | ICD-10-CM | POA: Diagnosis not present

## 2021-07-19 DIAGNOSIS — Z992 Dependence on renal dialysis: Secondary | ICD-10-CM | POA: Diagnosis not present

## 2021-07-19 DIAGNOSIS — M7989 Other specified soft tissue disorders: Secondary | ICD-10-CM | POA: Diagnosis not present

## 2021-07-19 DIAGNOSIS — G4700 Insomnia, unspecified: Secondary | ICD-10-CM | POA: Diagnosis not present

## 2021-07-19 DIAGNOSIS — E1142 Type 2 diabetes mellitus with diabetic polyneuropathy: Secondary | ICD-10-CM

## 2021-07-19 DIAGNOSIS — I429 Cardiomyopathy, unspecified: Secondary | ICD-10-CM | POA: Diagnosis not present

## 2021-07-19 DIAGNOSIS — M19072 Primary osteoarthritis, left ankle and foot: Secondary | ICD-10-CM | POA: Diagnosis not present

## 2021-07-19 DIAGNOSIS — Z452 Encounter for adjustment and management of vascular access device: Secondary | ICD-10-CM | POA: Diagnosis not present

## 2021-07-19 DIAGNOSIS — I132 Hypertensive heart and chronic kidney disease with heart failure and with stage 5 chronic kidney disease, or end stage renal disease: Secondary | ICD-10-CM | POA: Diagnosis not present

## 2021-07-19 DIAGNOSIS — M1000 Idiopathic gout, unspecified site: Secondary | ICD-10-CM | POA: Diagnosis not present

## 2021-07-19 DIAGNOSIS — I4891 Unspecified atrial fibrillation: Secondary | ICD-10-CM | POA: Diagnosis not present

## 2021-07-19 DIAGNOSIS — I5032 Chronic diastolic (congestive) heart failure: Secondary | ICD-10-CM | POA: Diagnosis not present

## 2021-07-19 DIAGNOSIS — N186 End stage renal disease: Secondary | ICD-10-CM | POA: Diagnosis not present

## 2021-07-19 DIAGNOSIS — M79675 Pain in left toe(s): Secondary | ICD-10-CM

## 2021-07-19 DIAGNOSIS — I2510 Atherosclerotic heart disease of native coronary artery without angina pectoris: Secondary | ICD-10-CM | POA: Diagnosis not present

## 2021-07-19 DIAGNOSIS — J9621 Acute and chronic respiratory failure with hypoxia: Secondary | ICD-10-CM | POA: Diagnosis not present

## 2021-07-19 DIAGNOSIS — G4733 Obstructive sleep apnea (adult) (pediatric): Secondary | ICD-10-CM | POA: Diagnosis not present

## 2021-07-19 DIAGNOSIS — H04123 Dry eye syndrome of bilateral lacrimal glands: Secondary | ICD-10-CM | POA: Diagnosis not present

## 2021-07-19 NOTE — Progress Notes (Signed)
This patient returns to my office for at risk foot care.  This patient requires this care by a professional since this patient will be at risk due to having diabetes,ESRD and thrombocytopenia.  This patient is unable to cut nails herself since the patient cannot reach her nails.These nails are painful walking and wearing shoes.  She presents to the office in a wheelchair.  This patient presents for at risk foot care today.  General Appearance  Alert, conversant and in no acute stress.  Vascular  Dorsalis pedis and posterior tibial  pulses are  not palpable due to swelling  bilaterally.  Capillary return is within normal limits  bilaterally. Temperature is within normal limits  bilaterally.  Neurologic  Senn-Weinstein monofilament wire test diminished  bilaterally. Muscle power within normal limits bilaterally.  Nails Thick disfigured discolored nails with subungual debris  from hallux to fifth toes bilaterally. No evidence of bacterial infection or drainage bilaterally.  Orthopedic  No limitations of motion  feet .  No crepitus or effusions noted.  No bony pathology or digital deformities noted.  Skin  normotropic skin with no porokeratosis noted bilaterally.  No signs of infections or ulcers noted.   Callus sub 5th left foot and sub 5th right foot.  Onychomycosis  Pain in right toes  Pain in left toes  Consent was obtained for treatment procedures.   Mechanical debridement of nails 1-5  bilaterally performed with a nail nipper.  Filed with dremel without incident. Debrided callus with dremel tool.   Return office visit   4 months                     Told patient to return for periodic foot care and evaluation due to potential at risk complications.   Gardiner Barefoot DPM

## 2021-07-22 DIAGNOSIS — E2740 Unspecified adrenocortical insufficiency: Secondary | ICD-10-CM | POA: Diagnosis not present

## 2021-07-22 DIAGNOSIS — G4700 Insomnia, unspecified: Secondary | ICD-10-CM | POA: Diagnosis not present

## 2021-07-22 DIAGNOSIS — N186 End stage renal disease: Secondary | ICD-10-CM | POA: Diagnosis not present

## 2021-07-22 DIAGNOSIS — J449 Chronic obstructive pulmonary disease, unspecified: Secondary | ICD-10-CM | POA: Diagnosis not present

## 2021-07-22 DIAGNOSIS — S72142D Displaced intertrochanteric fracture of left femur, subsequent encounter for closed fracture with routine healing: Secondary | ICD-10-CM | POA: Diagnosis not present

## 2021-07-22 DIAGNOSIS — M7989 Other specified soft tissue disorders: Secondary | ICD-10-CM | POA: Diagnosis not present

## 2021-07-22 DIAGNOSIS — E1122 Type 2 diabetes mellitus with diabetic chronic kidney disease: Secondary | ICD-10-CM | POA: Diagnosis not present

## 2021-07-22 DIAGNOSIS — M454 Ankylosing spondylitis of thoracic region: Secondary | ICD-10-CM | POA: Diagnosis not present

## 2021-07-22 DIAGNOSIS — G4733 Obstructive sleep apnea (adult) (pediatric): Secondary | ICD-10-CM | POA: Diagnosis not present

## 2021-07-22 DIAGNOSIS — G8929 Other chronic pain: Secondary | ICD-10-CM | POA: Diagnosis not present

## 2021-07-22 DIAGNOSIS — I2510 Atherosclerotic heart disease of native coronary artery without angina pectoris: Secondary | ICD-10-CM | POA: Diagnosis not present

## 2021-07-22 DIAGNOSIS — J4520 Mild intermittent asthma, uncomplicated: Secondary | ICD-10-CM | POA: Diagnosis not present

## 2021-07-22 DIAGNOSIS — I429 Cardiomyopathy, unspecified: Secondary | ICD-10-CM | POA: Diagnosis not present

## 2021-07-22 DIAGNOSIS — D631 Anemia in chronic kidney disease: Secondary | ICD-10-CM | POA: Diagnosis not present

## 2021-07-22 DIAGNOSIS — I132 Hypertensive heart and chronic kidney disease with heart failure and with stage 5 chronic kidney disease, or end stage renal disease: Secondary | ICD-10-CM | POA: Diagnosis not present

## 2021-07-22 DIAGNOSIS — I5032 Chronic diastolic (congestive) heart failure: Secondary | ICD-10-CM | POA: Diagnosis not present

## 2021-07-22 DIAGNOSIS — M1000 Idiopathic gout, unspecified site: Secondary | ICD-10-CM | POA: Diagnosis not present

## 2021-07-22 DIAGNOSIS — I4891 Unspecified atrial fibrillation: Secondary | ICD-10-CM | POA: Diagnosis not present

## 2021-07-22 DIAGNOSIS — I9589 Other hypotension: Secondary | ICD-10-CM | POA: Diagnosis not present

## 2021-07-22 DIAGNOSIS — H04123 Dry eye syndrome of bilateral lacrimal glands: Secondary | ICD-10-CM | POA: Diagnosis not present

## 2021-07-22 DIAGNOSIS — J9621 Acute and chronic respiratory failure with hypoxia: Secondary | ICD-10-CM | POA: Diagnosis not present

## 2021-07-22 DIAGNOSIS — E782 Mixed hyperlipidemia: Secondary | ICD-10-CM | POA: Diagnosis not present

## 2021-07-22 DIAGNOSIS — M19072 Primary osteoarthritis, left ankle and foot: Secondary | ICD-10-CM | POA: Diagnosis not present

## 2021-07-22 DIAGNOSIS — I442 Atrioventricular block, complete: Secondary | ICD-10-CM | POA: Diagnosis not present

## 2021-07-25 DIAGNOSIS — S72142D Displaced intertrochanteric fracture of left femur, subsequent encounter for closed fracture with routine healing: Secondary | ICD-10-CM | POA: Diagnosis not present

## 2021-07-25 DIAGNOSIS — M7989 Other specified soft tissue disorders: Secondary | ICD-10-CM | POA: Diagnosis not present

## 2021-07-25 DIAGNOSIS — J4520 Mild intermittent asthma, uncomplicated: Secondary | ICD-10-CM | POA: Diagnosis not present

## 2021-07-25 DIAGNOSIS — E782 Mixed hyperlipidemia: Secondary | ICD-10-CM | POA: Diagnosis not present

## 2021-07-25 DIAGNOSIS — G4733 Obstructive sleep apnea (adult) (pediatric): Secondary | ICD-10-CM | POA: Diagnosis not present

## 2021-07-25 DIAGNOSIS — I9589 Other hypotension: Secondary | ICD-10-CM | POA: Diagnosis not present

## 2021-07-25 DIAGNOSIS — J449 Chronic obstructive pulmonary disease, unspecified: Secondary | ICD-10-CM | POA: Diagnosis not present

## 2021-07-25 DIAGNOSIS — E2740 Unspecified adrenocortical insufficiency: Secondary | ICD-10-CM | POA: Diagnosis not present

## 2021-07-25 DIAGNOSIS — G8929 Other chronic pain: Secondary | ICD-10-CM | POA: Diagnosis not present

## 2021-07-25 DIAGNOSIS — H04123 Dry eye syndrome of bilateral lacrimal glands: Secondary | ICD-10-CM | POA: Diagnosis not present

## 2021-07-25 DIAGNOSIS — M454 Ankylosing spondylitis of thoracic region: Secondary | ICD-10-CM | POA: Diagnosis not present

## 2021-07-25 DIAGNOSIS — I429 Cardiomyopathy, unspecified: Secondary | ICD-10-CM | POA: Diagnosis not present

## 2021-07-25 DIAGNOSIS — G4700 Insomnia, unspecified: Secondary | ICD-10-CM | POA: Diagnosis not present

## 2021-07-25 DIAGNOSIS — I2510 Atherosclerotic heart disease of native coronary artery without angina pectoris: Secondary | ICD-10-CM | POA: Diagnosis not present

## 2021-07-25 DIAGNOSIS — M19072 Primary osteoarthritis, left ankle and foot: Secondary | ICD-10-CM | POA: Diagnosis not present

## 2021-07-25 DIAGNOSIS — I5032 Chronic diastolic (congestive) heart failure: Secondary | ICD-10-CM | POA: Diagnosis not present

## 2021-07-25 DIAGNOSIS — I4891 Unspecified atrial fibrillation: Secondary | ICD-10-CM | POA: Diagnosis not present

## 2021-07-25 DIAGNOSIS — N186 End stage renal disease: Secondary | ICD-10-CM | POA: Diagnosis not present

## 2021-07-25 DIAGNOSIS — D631 Anemia in chronic kidney disease: Secondary | ICD-10-CM | POA: Diagnosis not present

## 2021-07-25 DIAGNOSIS — E1122 Type 2 diabetes mellitus with diabetic chronic kidney disease: Secondary | ICD-10-CM | POA: Diagnosis not present

## 2021-07-25 DIAGNOSIS — M1000 Idiopathic gout, unspecified site: Secondary | ICD-10-CM | POA: Diagnosis not present

## 2021-07-25 DIAGNOSIS — I442 Atrioventricular block, complete: Secondary | ICD-10-CM | POA: Diagnosis not present

## 2021-07-25 DIAGNOSIS — I132 Hypertensive heart and chronic kidney disease with heart failure and with stage 5 chronic kidney disease, or end stage renal disease: Secondary | ICD-10-CM | POA: Diagnosis not present

## 2021-07-25 DIAGNOSIS — J9621 Acute and chronic respiratory failure with hypoxia: Secondary | ICD-10-CM | POA: Diagnosis not present

## 2021-07-26 ENCOUNTER — Telehealth: Payer: Self-pay | Admitting: Cardiovascular Disease

## 2021-07-26 ENCOUNTER — Telehealth: Payer: Self-pay

## 2021-07-26 NOTE — Telephone Encounter (Signed)
Device clinic stated if the light is green, no need for a download.Patient informed.

## 2021-07-26 NOTE — Telephone Encounter (Signed)
Pt states that the lights on her Pacemaker went from green to yellow and does not know what that means. Pt would like for nurse to call her back. Pease advise

## 2021-07-26 NOTE — Telephone Encounter (Signed)
Patient stated her transmitter turned yellow las night and this morning. The light is green now. Message sent to device clinic for them to do a download.

## 2021-07-29 DIAGNOSIS — E1122 Type 2 diabetes mellitus with diabetic chronic kidney disease: Secondary | ICD-10-CM | POA: Diagnosis not present

## 2021-07-29 DIAGNOSIS — J4520 Mild intermittent asthma, uncomplicated: Secondary | ICD-10-CM | POA: Diagnosis not present

## 2021-07-29 DIAGNOSIS — N186 End stage renal disease: Secondary | ICD-10-CM | POA: Diagnosis not present

## 2021-07-29 DIAGNOSIS — S72142D Displaced intertrochanteric fracture of left femur, subsequent encounter for closed fracture with routine healing: Secondary | ICD-10-CM | POA: Diagnosis not present

## 2021-07-29 DIAGNOSIS — I132 Hypertensive heart and chronic kidney disease with heart failure and with stage 5 chronic kidney disease, or end stage renal disease: Secondary | ICD-10-CM | POA: Diagnosis not present

## 2021-07-29 DIAGNOSIS — G4733 Obstructive sleep apnea (adult) (pediatric): Secondary | ICD-10-CM | POA: Diagnosis not present

## 2021-07-29 DIAGNOSIS — E2740 Unspecified adrenocortical insufficiency: Secondary | ICD-10-CM | POA: Diagnosis not present

## 2021-07-29 DIAGNOSIS — D631 Anemia in chronic kidney disease: Secondary | ICD-10-CM | POA: Diagnosis not present

## 2021-07-29 DIAGNOSIS — I5032 Chronic diastolic (congestive) heart failure: Secondary | ICD-10-CM | POA: Diagnosis not present

## 2021-07-29 DIAGNOSIS — I442 Atrioventricular block, complete: Secondary | ICD-10-CM | POA: Diagnosis not present

## 2021-07-29 DIAGNOSIS — M1000 Idiopathic gout, unspecified site: Secondary | ICD-10-CM | POA: Diagnosis not present

## 2021-07-29 DIAGNOSIS — I2510 Atherosclerotic heart disease of native coronary artery without angina pectoris: Secondary | ICD-10-CM | POA: Diagnosis not present

## 2021-07-29 DIAGNOSIS — I429 Cardiomyopathy, unspecified: Secondary | ICD-10-CM | POA: Diagnosis not present

## 2021-07-29 DIAGNOSIS — J449 Chronic obstructive pulmonary disease, unspecified: Secondary | ICD-10-CM | POA: Diagnosis not present

## 2021-07-29 DIAGNOSIS — M454 Ankylosing spondylitis of thoracic region: Secondary | ICD-10-CM | POA: Diagnosis not present

## 2021-07-29 DIAGNOSIS — H04123 Dry eye syndrome of bilateral lacrimal glands: Secondary | ICD-10-CM | POA: Diagnosis not present

## 2021-07-29 DIAGNOSIS — M7989 Other specified soft tissue disorders: Secondary | ICD-10-CM | POA: Diagnosis not present

## 2021-07-29 DIAGNOSIS — E782 Mixed hyperlipidemia: Secondary | ICD-10-CM | POA: Diagnosis not present

## 2021-07-29 DIAGNOSIS — I4891 Unspecified atrial fibrillation: Secondary | ICD-10-CM | POA: Diagnosis not present

## 2021-07-29 DIAGNOSIS — G4700 Insomnia, unspecified: Secondary | ICD-10-CM | POA: Diagnosis not present

## 2021-07-29 DIAGNOSIS — I9589 Other hypotension: Secondary | ICD-10-CM | POA: Diagnosis not present

## 2021-07-29 DIAGNOSIS — J9621 Acute and chronic respiratory failure with hypoxia: Secondary | ICD-10-CM | POA: Diagnosis not present

## 2021-07-29 DIAGNOSIS — M19072 Primary osteoarthritis, left ankle and foot: Secondary | ICD-10-CM | POA: Diagnosis not present

## 2021-07-29 DIAGNOSIS — G8929 Other chronic pain: Secondary | ICD-10-CM | POA: Diagnosis not present

## 2021-07-29 NOTE — Telephone Encounter (Signed)
Called patient no answer no voice mail.  

## 2021-07-30 NOTE — Telephone Encounter (Signed)
No voicemail no answer

## 2021-07-31 NOTE — Telephone Encounter (Signed)
3rd time trying to reach out to the patient about her monitor. I will send the patient a letter.

## 2021-08-02 DIAGNOSIS — S72142D Displaced intertrochanteric fracture of left femur, subsequent encounter for closed fracture with routine healing: Secondary | ICD-10-CM | POA: Diagnosis not present

## 2021-08-02 DIAGNOSIS — I4891 Unspecified atrial fibrillation: Secondary | ICD-10-CM | POA: Diagnosis not present

## 2021-08-02 DIAGNOSIS — D631 Anemia in chronic kidney disease: Secondary | ICD-10-CM | POA: Diagnosis not present

## 2021-08-02 DIAGNOSIS — M19072 Primary osteoarthritis, left ankle and foot: Secondary | ICD-10-CM | POA: Diagnosis not present

## 2021-08-02 DIAGNOSIS — I442 Atrioventricular block, complete: Secondary | ICD-10-CM | POA: Diagnosis not present

## 2021-08-02 DIAGNOSIS — E782 Mixed hyperlipidemia: Secondary | ICD-10-CM | POA: Diagnosis not present

## 2021-08-02 DIAGNOSIS — M7989 Other specified soft tissue disorders: Secondary | ICD-10-CM | POA: Diagnosis not present

## 2021-08-02 DIAGNOSIS — G4700 Insomnia, unspecified: Secondary | ICD-10-CM | POA: Diagnosis not present

## 2021-08-02 DIAGNOSIS — M454 Ankylosing spondylitis of thoracic region: Secondary | ICD-10-CM | POA: Diagnosis not present

## 2021-08-02 DIAGNOSIS — I2510 Atherosclerotic heart disease of native coronary artery without angina pectoris: Secondary | ICD-10-CM | POA: Diagnosis not present

## 2021-08-02 DIAGNOSIS — E2740 Unspecified adrenocortical insufficiency: Secondary | ICD-10-CM | POA: Diagnosis not present

## 2021-08-02 DIAGNOSIS — I5032 Chronic diastolic (congestive) heart failure: Secondary | ICD-10-CM | POA: Diagnosis not present

## 2021-08-02 DIAGNOSIS — J449 Chronic obstructive pulmonary disease, unspecified: Secondary | ICD-10-CM | POA: Diagnosis not present

## 2021-08-02 DIAGNOSIS — G8929 Other chronic pain: Secondary | ICD-10-CM | POA: Diagnosis not present

## 2021-08-02 DIAGNOSIS — E1122 Type 2 diabetes mellitus with diabetic chronic kidney disease: Secondary | ICD-10-CM | POA: Diagnosis not present

## 2021-08-02 DIAGNOSIS — M1000 Idiopathic gout, unspecified site: Secondary | ICD-10-CM | POA: Diagnosis not present

## 2021-08-02 DIAGNOSIS — I132 Hypertensive heart and chronic kidney disease with heart failure and with stage 5 chronic kidney disease, or end stage renal disease: Secondary | ICD-10-CM | POA: Diagnosis not present

## 2021-08-02 DIAGNOSIS — N186 End stage renal disease: Secondary | ICD-10-CM | POA: Diagnosis not present

## 2021-08-02 DIAGNOSIS — I429 Cardiomyopathy, unspecified: Secondary | ICD-10-CM | POA: Diagnosis not present

## 2021-08-02 DIAGNOSIS — G4733 Obstructive sleep apnea (adult) (pediatric): Secondary | ICD-10-CM | POA: Diagnosis not present

## 2021-08-02 DIAGNOSIS — I9589 Other hypotension: Secondary | ICD-10-CM | POA: Diagnosis not present

## 2021-08-02 DIAGNOSIS — H04123 Dry eye syndrome of bilateral lacrimal glands: Secondary | ICD-10-CM | POA: Diagnosis not present

## 2021-08-02 DIAGNOSIS — J4520 Mild intermittent asthma, uncomplicated: Secondary | ICD-10-CM | POA: Diagnosis not present

## 2021-08-02 DIAGNOSIS — J9621 Acute and chronic respiratory failure with hypoxia: Secondary | ICD-10-CM | POA: Diagnosis not present

## 2021-08-04 ENCOUNTER — Other Ambulatory Visit: Payer: Self-pay

## 2021-08-04 ENCOUNTER — Encounter (HOSPITAL_COMMUNITY): Payer: Self-pay | Admitting: Emergency Medicine

## 2021-08-04 ENCOUNTER — Emergency Department (HOSPITAL_COMMUNITY): Payer: Medicare Other

## 2021-08-04 ENCOUNTER — Inpatient Hospital Stay (HOSPITAL_COMMUNITY)
Admission: EM | Admit: 2021-08-04 | Discharge: 2021-08-15 | DRG: 291 | Disposition: A | Discharge status: Home Health | Payer: Medicare Other | Attending: Internal Medicine | Admitting: Internal Medicine

## 2021-08-04 DIAGNOSIS — I2510 Atherosclerotic heart disease of native coronary artery without angina pectoris: Secondary | ICD-10-CM | POA: Diagnosis present

## 2021-08-04 DIAGNOSIS — J9811 Atelectasis: Secondary | ICD-10-CM | POA: Diagnosis not present

## 2021-08-04 DIAGNOSIS — L03115 Cellulitis of right lower limb: Secondary | ICD-10-CM | POA: Diagnosis not present

## 2021-08-04 DIAGNOSIS — Z20822 Contact with and (suspected) exposure to covid-19: Secondary | ICD-10-CM | POA: Diagnosis not present

## 2021-08-04 DIAGNOSIS — G4733 Obstructive sleep apnea (adult) (pediatric): Secondary | ICD-10-CM | POA: Diagnosis not present

## 2021-08-04 DIAGNOSIS — Z825 Family history of asthma and other chronic lower respiratory diseases: Secondary | ICD-10-CM

## 2021-08-04 DIAGNOSIS — I130 Hypertensive heart and chronic kidney disease with heart failure and stage 1 through stage 4 chronic kidney disease, or unspecified chronic kidney disease: Principal | ICD-10-CM | POA: Diagnosis present

## 2021-08-04 DIAGNOSIS — D631 Anemia in chronic kidney disease: Secondary | ICD-10-CM | POA: Diagnosis present

## 2021-08-04 DIAGNOSIS — E273 Drug-induced adrenocortical insufficiency: Secondary | ICD-10-CM | POA: Diagnosis present

## 2021-08-04 DIAGNOSIS — Z888 Allergy status to other drugs, medicaments and biological substances status: Secondary | ICD-10-CM

## 2021-08-04 DIAGNOSIS — M109 Gout, unspecified: Secondary | ICD-10-CM | POA: Diagnosis present

## 2021-08-04 DIAGNOSIS — J449 Chronic obstructive pulmonary disease, unspecified: Secondary | ICD-10-CM | POA: Diagnosis not present

## 2021-08-04 DIAGNOSIS — Z91199 Patient's noncompliance with other medical treatment and regimen due to unspecified reason: Secondary | ICD-10-CM

## 2021-08-04 DIAGNOSIS — N179 Acute kidney failure, unspecified: Secondary | ICD-10-CM

## 2021-08-04 DIAGNOSIS — N186 End stage renal disease: Secondary | ICD-10-CM | POA: Diagnosis present

## 2021-08-04 DIAGNOSIS — M10271 Drug-induced gout, right ankle and foot: Secondary | ICD-10-CM | POA: Diagnosis not present

## 2021-08-04 DIAGNOSIS — R0602 Shortness of breath: Secondary | ICD-10-CM | POA: Diagnosis not present

## 2021-08-04 DIAGNOSIS — K589 Irritable bowel syndrome without diarrhea: Secondary | ICD-10-CM | POA: Diagnosis present

## 2021-08-04 DIAGNOSIS — J9621 Acute and chronic respiratory failure with hypoxia: Secondary | ICD-10-CM | POA: Diagnosis present

## 2021-08-04 DIAGNOSIS — Z8673 Personal history of transient ischemic attack (TIA), and cerebral infarction without residual deficits: Secondary | ICD-10-CM

## 2021-08-04 DIAGNOSIS — E7800 Pure hypercholesterolemia, unspecified: Secondary | ICD-10-CM | POA: Diagnosis not present

## 2021-08-04 DIAGNOSIS — Z8249 Family history of ischemic heart disease and other diseases of the circulatory system: Secondary | ICD-10-CM

## 2021-08-04 DIAGNOSIS — I517 Cardiomegaly: Secondary | ICD-10-CM | POA: Diagnosis not present

## 2021-08-04 DIAGNOSIS — Z9981 Dependence on supplemental oxygen: Secondary | ICD-10-CM

## 2021-08-04 DIAGNOSIS — J4520 Mild intermittent asthma, uncomplicated: Secondary | ICD-10-CM | POA: Diagnosis not present

## 2021-08-04 DIAGNOSIS — Z833 Family history of diabetes mellitus: Secondary | ICD-10-CM

## 2021-08-04 DIAGNOSIS — D539 Nutritional anemia, unspecified: Secondary | ICD-10-CM | POA: Diagnosis present

## 2021-08-04 DIAGNOSIS — I4510 Unspecified right bundle-branch block: Secondary | ICD-10-CM | POA: Diagnosis present

## 2021-08-04 DIAGNOSIS — I5043 Acute on chronic combined systolic (congestive) and diastolic (congestive) heart failure: Secondary | ICD-10-CM | POA: Diagnosis not present

## 2021-08-04 DIAGNOSIS — I471 Supraventricular tachycardia: Secondary | ICD-10-CM | POA: Diagnosis not present

## 2021-08-04 DIAGNOSIS — E8809 Other disorders of plasma-protein metabolism, not elsewhere classified: Secondary | ICD-10-CM | POA: Diagnosis not present

## 2021-08-04 DIAGNOSIS — R0609 Other forms of dyspnea: Secondary | ICD-10-CM | POA: Diagnosis not present

## 2021-08-04 DIAGNOSIS — L03116 Cellulitis of left lower limb: Secondary | ICD-10-CM | POA: Diagnosis present

## 2021-08-04 DIAGNOSIS — R609 Edema, unspecified: Secondary | ICD-10-CM | POA: Diagnosis not present

## 2021-08-04 DIAGNOSIS — E1122 Type 2 diabetes mellitus with diabetic chronic kidney disease: Secondary | ICD-10-CM | POA: Diagnosis present

## 2021-08-04 DIAGNOSIS — E8770 Fluid overload, unspecified: Secondary | ICD-10-CM | POA: Diagnosis not present

## 2021-08-04 DIAGNOSIS — E662 Morbid (severe) obesity with alveolar hypoventilation: Secondary | ICD-10-CM | POA: Diagnosis present

## 2021-08-04 DIAGNOSIS — Z6841 Body Mass Index (BMI) 40.0 and over, adult: Secondary | ICD-10-CM | POA: Diagnosis not present

## 2021-08-04 DIAGNOSIS — B3731 Acute candidiasis of vulva and vagina: Secondary | ICD-10-CM | POA: Diagnosis present

## 2021-08-04 DIAGNOSIS — Z87891 Personal history of nicotine dependence: Secondary | ICD-10-CM | POA: Diagnosis not present

## 2021-08-04 DIAGNOSIS — J9611 Chronic respiratory failure with hypoxia: Secondary | ICD-10-CM | POA: Diagnosis not present

## 2021-08-04 DIAGNOSIS — I5041 Acute combined systolic (congestive) and diastolic (congestive) heart failure: Secondary | ICD-10-CM | POA: Diagnosis not present

## 2021-08-04 DIAGNOSIS — D696 Thrombocytopenia, unspecified: Secondary | ICD-10-CM | POA: Diagnosis present

## 2021-08-04 DIAGNOSIS — Z9861 Coronary angioplasty status: Secondary | ICD-10-CM | POA: Diagnosis not present

## 2021-08-04 DIAGNOSIS — G8929 Other chronic pain: Secondary | ICD-10-CM | POA: Diagnosis not present

## 2021-08-04 DIAGNOSIS — N184 Chronic kidney disease, stage 4 (severe): Secondary | ICD-10-CM | POA: Diagnosis not present

## 2021-08-04 DIAGNOSIS — Z951 Presence of aortocoronary bypass graft: Secondary | ICD-10-CM

## 2021-08-04 DIAGNOSIS — Z7982 Long term (current) use of aspirin: Secondary | ICD-10-CM

## 2021-08-04 DIAGNOSIS — N1832 Chronic kidney disease, stage 3b: Secondary | ICD-10-CM | POA: Diagnosis not present

## 2021-08-04 DIAGNOSIS — I480 Paroxysmal atrial fibrillation: Secondary | ICD-10-CM | POA: Diagnosis present

## 2021-08-04 DIAGNOSIS — J90 Pleural effusion, not elsewhere classified: Secondary | ICD-10-CM | POA: Diagnosis not present

## 2021-08-04 DIAGNOSIS — M6281 Muscle weakness (generalized): Secondary | ICD-10-CM | POA: Diagnosis not present

## 2021-08-04 DIAGNOSIS — N189 Chronic kidney disease, unspecified: Secondary | ICD-10-CM | POA: Diagnosis not present

## 2021-08-04 DIAGNOSIS — K219 Gastro-esophageal reflux disease without esophagitis: Secondary | ICD-10-CM | POA: Diagnosis not present

## 2021-08-04 DIAGNOSIS — Z950 Presence of cardiac pacemaker: Secondary | ICD-10-CM | POA: Diagnosis not present

## 2021-08-04 DIAGNOSIS — I248 Other forms of acute ischemic heart disease: Secondary | ICD-10-CM | POA: Diagnosis present

## 2021-08-04 DIAGNOSIS — Z7951 Long term (current) use of inhaled steroids: Secondary | ICD-10-CM

## 2021-08-04 DIAGNOSIS — R918 Other nonspecific abnormal finding of lung field: Secondary | ICD-10-CM | POA: Diagnosis not present

## 2021-08-04 DIAGNOSIS — K760 Fatty (change of) liver, not elsewhere classified: Secondary | ICD-10-CM | POA: Diagnosis not present

## 2021-08-04 DIAGNOSIS — E8779 Other fluid overload: Secondary | ICD-10-CM | POA: Diagnosis not present

## 2021-08-04 DIAGNOSIS — J9612 Chronic respiratory failure with hypercapnia: Secondary | ICD-10-CM | POA: Diagnosis not present

## 2021-08-04 DIAGNOSIS — E1169 Type 2 diabetes mellitus with other specified complication: Secondary | ICD-10-CM | POA: Diagnosis not present

## 2021-08-04 DIAGNOSIS — I951 Orthostatic hypotension: Secondary | ICD-10-CM | POA: Diagnosis not present

## 2021-08-04 DIAGNOSIS — I5022 Chronic systolic (congestive) heart failure: Secondary | ICD-10-CM | POA: Diagnosis not present

## 2021-08-04 DIAGNOSIS — E538 Deficiency of other specified B group vitamins: Secondary | ICD-10-CM | POA: Diagnosis present

## 2021-08-04 DIAGNOSIS — R52 Pain, unspecified: Secondary | ICD-10-CM | POA: Diagnosis not present

## 2021-08-04 DIAGNOSIS — I5033 Acute on chronic diastolic (congestive) heart failure: Secondary | ICD-10-CM | POA: Diagnosis not present

## 2021-08-04 DIAGNOSIS — I129 Hypertensive chronic kidney disease with stage 1 through stage 4 chronic kidney disease, or unspecified chronic kidney disease: Secondary | ICD-10-CM | POA: Diagnosis not present

## 2021-08-04 DIAGNOSIS — J811 Chronic pulmonary edema: Secondary | ICD-10-CM | POA: Diagnosis not present

## 2021-08-04 DIAGNOSIS — I509 Heart failure, unspecified: Secondary | ICD-10-CM | POA: Diagnosis not present

## 2021-08-04 DIAGNOSIS — Z7902 Long term (current) use of antithrombotics/antiplatelets: Secondary | ICD-10-CM

## 2021-08-04 DIAGNOSIS — M1000 Idiopathic gout, unspecified site: Secondary | ICD-10-CM | POA: Diagnosis not present

## 2021-08-04 DIAGNOSIS — Z79899 Other long term (current) drug therapy: Secondary | ICD-10-CM

## 2021-08-04 DIAGNOSIS — Z91041 Radiographic dye allergy status: Secondary | ICD-10-CM

## 2021-08-04 DIAGNOSIS — Z95810 Presence of automatic (implantable) cardiac defibrillator: Secondary | ICD-10-CM

## 2021-08-04 DIAGNOSIS — L0390 Cellulitis, unspecified: Secondary | ICD-10-CM | POA: Diagnosis not present

## 2021-08-04 DIAGNOSIS — M19072 Primary osteoarthritis, left ankle and foot: Secondary | ICD-10-CM | POA: Diagnosis not present

## 2021-08-04 DIAGNOSIS — I5031 Acute diastolic (congestive) heart failure: Secondary | ICD-10-CM | POA: Diagnosis not present

## 2021-08-04 DIAGNOSIS — Z91013 Allergy to seafood: Secondary | ICD-10-CM

## 2021-08-04 DIAGNOSIS — Z882 Allergy status to sulfonamides status: Secondary | ICD-10-CM

## 2021-08-04 LAB — BASIC METABOLIC PANEL
Anion gap: 12 (ref 5–15)
BUN: 45 mg/dL — ABNORMAL HIGH (ref 8–23)
CO2: 40 mmol/L — ABNORMAL HIGH (ref 22–32)
Calcium: 9.4 mg/dL (ref 8.9–10.3)
Chloride: 88 mmol/L — ABNORMAL LOW (ref 98–111)
Creatinine, Ser: 3.38 mg/dL — ABNORMAL HIGH (ref 0.44–1.00)
GFR, Estimated: 13 mL/min — ABNORMAL LOW (ref >60)
Glucose, Bld: 129 mg/dL — ABNORMAL HIGH (ref 70–99)
Potassium: 3.3 mmol/L — ABNORMAL LOW (ref 3.5–5.1)
Sodium: 140 mmol/L (ref 135–145)

## 2021-08-04 LAB — CBC WITH DIFFERENTIAL/PLATELET
Abs Immature Granulocytes: 0.03 10*3/uL (ref 0.00–0.07)
Basophils Absolute: 0 10*3/uL (ref 0.0–0.1)
Basophils Relative: 1 %
Eosinophils Absolute: 0.2 10*3/uL (ref 0.0–0.5)
Eosinophils Relative: 3 %
HCT: 34.8 % — ABNORMAL LOW (ref 36.0–46.0)
Hemoglobin: 10.2 g/dL — ABNORMAL LOW (ref 12.0–15.0)
Immature Granulocytes: 0 %
Lymphocytes Relative: 18 %
Lymphs Abs: 1.2 10*3/uL (ref 0.7–4.0)
MCH: 31.1 pg (ref 26.0–34.0)
MCHC: 29.3 g/dL — ABNORMAL LOW (ref 30.0–36.0)
MCV: 106.1 fL — ABNORMAL HIGH (ref 80.0–100.0)
Monocytes Absolute: 0.5 10*3/uL (ref 0.1–1.0)
Monocytes Relative: 8 %
Neutro Abs: 4.7 10*3/uL (ref 1.7–7.7)
Neutrophils Relative %: 70 %
Platelets: 167 10*3/uL (ref 150–400)
RBC: 3.28 MIL/uL — ABNORMAL LOW (ref 3.87–5.11)
RDW: 13.2 % (ref 11.5–15.5)
WBC: 6.7 10*3/uL (ref 4.0–10.5)
nRBC: 0 % (ref 0.0–0.2)

## 2021-08-04 LAB — URINALYSIS, ROUTINE W REFLEX MICROSCOPIC
Bilirubin Urine: NEGATIVE
Glucose, UA: NEGATIVE mg/dL
Hgb urine dipstick: NEGATIVE
Ketones, ur: NEGATIVE mg/dL
Nitrite: NEGATIVE
Protein, ur: 30 mg/dL — AB
Specific Gravity, Urine: 1.014 (ref 1.005–1.030)
pH: 5 (ref 5.0–8.0)

## 2021-08-04 LAB — HEPATIC FUNCTION PANEL
ALT: 11 U/L (ref 0–44)
AST: 14 U/L — ABNORMAL LOW (ref 15–41)
Albumin: 3.1 g/dL — ABNORMAL LOW (ref 3.5–5.0)
Alkaline Phosphatase: 88 U/L (ref 38–126)
Bilirubin, Direct: 0.1 mg/dL (ref 0.0–0.2)
Indirect Bilirubin: 0.5 mg/dL (ref 0.3–0.9)
Total Bilirubin: 0.6 mg/dL (ref 0.3–1.2)
Total Protein: 6.1 g/dL — ABNORMAL LOW (ref 6.5–8.1)

## 2021-08-04 LAB — D-DIMER, QUANTITATIVE: D-Dimer, Quant: 2.34 ug/mL-FEU — ABNORMAL HIGH (ref 0.00–0.50)

## 2021-08-04 LAB — GLUCOSE, CAPILLARY: Glucose-Capillary: 149 mg/dL — ABNORMAL HIGH (ref 70–99)

## 2021-08-04 LAB — TROPONIN I (HIGH SENSITIVITY)
Troponin I (High Sensitivity): 30 ng/L — ABNORMAL HIGH (ref <18)
Troponin I (High Sensitivity): 30 ng/L — ABNORMAL HIGH (ref <18)

## 2021-08-04 LAB — SARS CORONAVIRUS 2 BY RT PCR: SARS Coronavirus 2 by RT PCR: NEGATIVE

## 2021-08-04 LAB — BRAIN NATRIURETIC PEPTIDE: B Natriuretic Peptide: 321.7 pg/mL — ABNORMAL HIGH (ref 0.0–100.0)

## 2021-08-04 MED ORDER — ASPIRIN 81 MG PO TBEC
81.0000 mg | DELAYED_RELEASE_TABLET | Freq: Every morning | ORAL | Status: DC
  Administered 2021-08-05 – 2021-08-15 (×11): 81 mg via ORAL
  Filled 2021-08-04 (×11): qty 1

## 2021-08-04 MED ORDER — FUROSEMIDE 10 MG/ML IJ SOLN
80.0000 mg | Freq: Two times a day (BID) | INTRAMUSCULAR | Status: DC
Start: 2021-08-05 — End: 2021-08-08
  Administered 2021-08-05 – 2021-08-08 (×7): 80 mg via INTRAVENOUS
  Filled 2021-08-04 (×8): qty 8

## 2021-08-04 MED ORDER — HEPARIN (PORCINE) 25000 UT/250ML-% IV SOLN
1250.0000 [IU]/h | INTRAVENOUS | Status: DC
  Administered 2021-08-04 – 2021-08-05 (×2): 1200 [IU]/h via INTRAVENOUS
  Administered 2021-08-06: 1250 [IU]/h via INTRAVENOUS
  Filled 2021-08-04 (×4): qty 250

## 2021-08-04 MED ORDER — ACETAMINOPHEN 325 MG PO TABS
650.0000 mg | ORAL_TABLET | Freq: Four times a day (QID) | ORAL | Status: DC | PRN
  Administered 2021-08-04 – 2021-08-13 (×14): 650 mg via ORAL
  Filled 2021-08-04 (×14): qty 2

## 2021-08-04 MED ORDER — INSULIN ASPART 100 UNIT/ML IJ SOLN
0.0000 [IU] | Freq: Three times a day (TID) | INTRAMUSCULAR | Status: DC
  Administered 2021-08-07: 2 [IU] via SUBCUTANEOUS
  Administered 2021-08-08 – 2021-08-10 (×3): 1 [IU] via SUBCUTANEOUS
  Administered 2021-08-13 – 2021-08-14 (×2): 2 [IU] via SUBCUTANEOUS

## 2021-08-04 MED ORDER — SODIUM CHLORIDE 0.9 % IV SOLN
3.0000 g | Freq: Once | INTRAVENOUS | Status: AC
  Administered 2021-08-04: 3 g via INTRAVENOUS
  Filled 2021-08-04: qty 8

## 2021-08-04 MED ORDER — ROSUVASTATIN CALCIUM 20 MG PO TABS
20.0000 mg | ORAL_TABLET | Freq: Every evening | ORAL | Status: DC
  Administered 2021-08-04 – 2021-08-07 (×4): 20 mg via ORAL
  Filled 2021-08-04 (×4): qty 1

## 2021-08-04 MED ORDER — ACETAMINOPHEN 650 MG RE SUPP: 650.0000 mg | Freq: Four times a day (QID) | RECTAL | Status: DC | PRN

## 2021-08-04 MED ORDER — CLOPIDOGREL BISULFATE 75 MG PO TABS
75.0000 mg | ORAL_TABLET | Freq: Every day | ORAL | Status: DC
  Administered 2021-08-04 – 2021-08-14 (×11): 75 mg via ORAL
  Filled 2021-08-04 (×11): qty 1

## 2021-08-04 MED ORDER — FUROSEMIDE 10 MG/ML IJ SOLN
80.0000 mg | Freq: Once | INTRAMUSCULAR | Status: AC
  Administered 2021-08-04: 80 mg via INTRAVENOUS
  Filled 2021-08-04: qty 8

## 2021-08-04 MED ORDER — ALBUTEROL SULFATE (2.5 MG/3ML) 0.083% IN NEBU
2.5000 mg | INHALATION_SOLUTION | Freq: Four times a day (QID) | RESPIRATORY_TRACT | Status: DC | PRN
  Administered 2021-08-09 – 2021-08-14 (×6): 2.5 mg via RESPIRATORY_TRACT
  Filled 2021-08-04 (×5): qty 3

## 2021-08-04 MED ORDER — SODIUM CHLORIDE 0.9 % IV SOLN
3.0000 g | Freq: Two times a day (BID) | INTRAVENOUS | Status: AC
  Administered 2021-08-05 – 2021-08-09 (×9): 3 g via INTRAVENOUS
  Filled 2021-08-04 (×9): qty 8

## 2021-08-04 MED ORDER — MOMETASONE FURO-FORMOTEROL FUM 200-5 MCG/ACT IN AERO
2.0000 | INHALATION_SPRAY | Freq: Two times a day (BID) | RESPIRATORY_TRACT | Status: DC
  Administered 2021-08-04 – 2021-08-15 (×22): 2 via RESPIRATORY_TRACT
  Filled 2021-08-04: qty 8.8

## 2021-08-04 MED ORDER — HEPARIN BOLUS VIA INFUSION
4500.0000 [IU] | Freq: Once | INTRAVENOUS | Status: AC
  Administered 2021-08-04: 4500 [IU] via INTRAVENOUS
  Filled 2021-08-04: qty 4500

## 2021-08-04 MED ORDER — FENTANYL CITRATE PF 50 MCG/ML IJ SOSY
12.5000 ug | PREFILLED_SYRINGE | Freq: Once | INTRAMUSCULAR | Status: AC
  Administered 2021-08-04: 12.5 ug via INTRAVENOUS
  Filled 2021-08-04: qty 1

## 2021-08-04 MED ORDER — ALLOPURINOL 100 MG PO TABS
100.0000 mg | ORAL_TABLET | Freq: Every morning | ORAL | Status: DC
  Administered 2021-08-05 – 2021-08-15 (×11): 100 mg via ORAL
  Filled 2021-08-04 (×11): qty 1

## 2021-08-05 ENCOUNTER — Inpatient Hospital Stay (HOSPITAL_COMMUNITY): Payer: Medicare Other

## 2021-08-05 DIAGNOSIS — G4733 Obstructive sleep apnea (adult) (pediatric): Secondary | ICD-10-CM

## 2021-08-05 DIAGNOSIS — R0609 Other forms of dyspnea: Secondary | ICD-10-CM | POA: Diagnosis not present

## 2021-08-05 DIAGNOSIS — I2510 Atherosclerotic heart disease of native coronary artery without angina pectoris: Secondary | ICD-10-CM | POA: Diagnosis not present

## 2021-08-05 DIAGNOSIS — Z9861 Coronary angioplasty status: Secondary | ICD-10-CM

## 2021-08-05 DIAGNOSIS — R609 Edema, unspecified: Secondary | ICD-10-CM | POA: Diagnosis not present

## 2021-08-05 DIAGNOSIS — Z951 Presence of aortocoronary bypass graft: Secondary | ICD-10-CM

## 2021-08-05 DIAGNOSIS — I5041 Acute combined systolic (congestive) and diastolic (congestive) heart failure: Secondary | ICD-10-CM

## 2021-08-05 DIAGNOSIS — R52 Pain, unspecified: Secondary | ICD-10-CM | POA: Diagnosis not present

## 2021-08-05 LAB — ECHOCARDIOGRAM COMPLETE
AR max vel: 1.68 cm2
AV Area VTI: 2.03 cm2
AV Area mean vel: 1.88 cm2
AV Mean grad: 11.7 mmHg
AV Peak grad: 22.9 mmHg
Ao pk vel: 2.39 m/s
Area-P 1/2: 4.06 cm2
Height: 63 in
S' Lateral: 3 cm
Weight: 3566.16 oz

## 2021-08-05 LAB — CBC
HCT: 36.1 % (ref 36.0–46.0)
Hemoglobin: 10.5 g/dL — ABNORMAL LOW (ref 12.0–15.0)
MCH: 30.8 pg (ref 26.0–34.0)
MCHC: 29.1 g/dL — ABNORMAL LOW (ref 30.0–36.0)
MCV: 105.9 fL — ABNORMAL HIGH (ref 80.0–100.0)
Platelets: 143 10*3/uL — ABNORMAL LOW (ref 150–400)
RBC: 3.41 MIL/uL — ABNORMAL LOW (ref 3.87–5.11)
RDW: 13.1 % (ref 11.5–15.5)
WBC: 4.7 10*3/uL (ref 4.0–10.5)
nRBC: 0 % (ref 0.0–0.2)

## 2021-08-05 LAB — BASIC METABOLIC PANEL
Anion gap: 12 (ref 5–15)
BUN: 45 mg/dL — ABNORMAL HIGH (ref 8–23)
CO2: 40 mmol/L — ABNORMAL HIGH (ref 22–32)
Calcium: 9.4 mg/dL (ref 8.9–10.3)
Chloride: 89 mmol/L — ABNORMAL LOW (ref 98–111)
Creatinine, Ser: 3.05 mg/dL — ABNORMAL HIGH (ref 0.44–1.00)
GFR, Estimated: 15 mL/min — ABNORMAL LOW (ref >60)
Glucose, Bld: 180 mg/dL — ABNORMAL HIGH (ref 70–99)
Potassium: 3.5 mmol/L (ref 3.5–5.1)
Sodium: 141 mmol/L (ref 135–145)

## 2021-08-05 LAB — HEPARIN LEVEL (UNFRACTIONATED)
Heparin Unfractionated: 0.45 IU/mL (ref 0.30–0.70)
Heparin Unfractionated: 0.35 IU/mL (ref 0.30–0.70)

## 2021-08-05 LAB — GLUCOSE, CAPILLARY
Glucose-Capillary: 142 mg/dL — ABNORMAL HIGH (ref 70–99)
Glucose-Capillary: 178 mg/dL — ABNORMAL HIGH (ref 70–99)
Glucose-Capillary: 136 mg/dL — ABNORMAL HIGH (ref 70–99)
Glucose-Capillary: 194 mg/dL — ABNORMAL HIGH (ref 70–99)

## 2021-08-05 MED ORDER — SODIUM CHLORIDE 0.9 % IV SOLN: INTRAVENOUS | Status: DC | PRN

## 2021-08-05 MED ORDER — METHYLPREDNISOLONE SODIUM SUCC 40 MG IJ SOLR
20.0000 mg | Freq: Once | INTRAMUSCULAR | Status: AC
Start: 2021-08-05 — End: 2021-08-05
  Administered 2021-08-05: 20 mg via INTRAVENOUS
  Filled 2021-08-05: qty 1

## 2021-08-05 MED ORDER — ORAL CARE MOUTH RINSE: 15.0000 mL | OROMUCOSAL | Status: DC | PRN

## 2021-08-05 MED ORDER — SALINE SPRAY 0.65 % NA SOLN
1.0000 | NASAL | Status: DC | PRN
  Administered 2021-08-05 – 2021-08-08 (×5): 1 via NASAL
  Filled 2021-08-05: qty 44

## 2021-08-05 MED ORDER — POLYVINYL ALCOHOL 1.4 % OP SOLN
1.0000 [drp] | OPHTHALMIC | Status: DC | PRN
  Administered 2021-08-06 – 2021-08-09 (×3): 1 [drp] via OPHTHALMIC
  Filled 2021-08-05: qty 15

## 2021-08-05 MED ORDER — PERFLUTREN LIPID MICROSPHERE
1.0000 mL | INTRAVENOUS | Status: DC | PRN
  Administered 2021-08-05: 2 mL via INTRAVENOUS

## 2021-08-05 MED ORDER — CYCLOSPORINE 0.05 % OP EMUL
1.0000 [drp] | Freq: Two times a day (BID) | OPHTHALMIC | Status: DC
  Administered 2021-08-05 – 2021-08-15 (×20): 1 [drp] via OPHTHALMIC
  Filled 2021-08-05 (×21): qty 30

## 2021-08-05 NOTE — TOC Initial Note (Signed)
Transition of Care [facility]) - Initial/Assessment Note    Patient Details  Name: [name] MRN: [id number] Date of Birth: November 25, 1945  Transition of Care [facility]) CM/SW Contact:    Golda Acre, RN Phone Number: 08/05/2021, 9:16 AM  Clinical Narrative:                  Transition of Care [facility]) Screening Note   Patient Details  Name: [name] Date of Birth: 05-12-1945   Transition of Care [facility]) CM/SW Contact:    Golda Acre, RN Phone Number: 08/05/2021, 9:16 AM    Transition of Care Department [facility]) has reviewed patient and no TOC needs have been identified at this time. We will continue to monitor patient advancement through interdisciplinary progression rounds. If new patient transition needs arise, please place a TOC consult.    Expected Discharge Plan: Home/Self Care Barriers to Discharge: Continued Medical Work up   Patient Goals and CMS Choice Patient states their goals for this hospitalization and ongoing recovery are:: to go back home CMS Medicare.gov Compare Post Acute Care list provided to:: Patient    Expected Discharge Plan and Services Expected Discharge Plan: Home/Self Care   Discharge Planning Services: CM Consult   Living arrangements for the past 2 months: Single Family Home                                      Prior Living Arrangements/Services Living arrangements for the past 2 months: Single Family Home Lives with:: Self Patient language and need for interpreter reviewed:: Yes Do you feel safe going back to the place where you live?: Yes            Criminal Activity/Legal Involvement Pertinent to Current Situation/Hospitalization: No - Comment as needed  Activities of Daily Living Home Assistive Devices/Equipment: Wheelchair, Eyeglasses, Oxygen, Walker (specify type) ADL Screening (condition at time of admission) Patient's cognitive ability adequate to safely complete daily activities?: Yes Is the patient  deaf or have difficulty hearing?: No Does the patient have difficulty seeing, even when wearing glasses/contacts?: No Does the patient have difficulty concentrating, remembering, or making decisions?: No Patient able to express need for assistance with ADLs?: Yes Does the patient have difficulty dressing or bathing?: Yes Independently performs ADLs?: No Communication: Independent Dressing (OT): Needs assistance Is this a change from baseline?: Pre-admission baseline Grooming: Needs assistance Is this a change from baseline?: Pre-admission baseline Feeding: Independent Bathing: Needs assistance Is this a change from baseline?: Pre-admission baseline Toileting: Needs assistance Is this a change from baseline?: Pre-admission baseline In/Out Bed: Needs assistance Is this a change from baseline?: Pre-admission baseline Walks in Home: Needs assistance Is this a change from baseline?: Pre-admission baseline Does the patient have difficulty walking or climbing stairs?: Yes Weakness of Legs: Both Weakness of Arms/Hands: None  Permission Sought/Granted                  Emotional Assessment Appearance:: Appears stated age     Orientation: : Oriented to Self, Oriented to Place, Oriented to  Time, Oriented to Situation Alcohol / Substance Use: Never Used Psych Involvement: No (comment)  Admission diagnosis:  SOB (shortness of breath) [R06.02] Acute CHF (congestive heart failure) (HCC) [I50.9] Hypervolemia, unspecified hypervolemia type [E87.70] Acute renal failure superimposed on chronic kidney disease, unspecified CKD stage, unspecified acute renal failure type (HCC) [N17.9, N18.9] Patient Active Problem List   Diagnosis  Date Noted   Acute CHF (congestive heart failure) (HCC) 08/04/2021   Callus 07/19/2021   Hypervolemia associated with renal insufficiency 06/11/2021   Physical deconditioning 06/11/2021   Subcutaneous fat necrosis    Pressure injury of skin 04/13/2021   Closed  left hip fracture (HCC) secondary to fall at home 04/03/2021   Closed fracture of left superior pubic ramus (HCC) 04/03/2021   Chronic kidney disease, stage III B (moderate) (HCC) 04/03/2021   Fall at home, initial encounter 04/03/2021   Hypokalemia 04/03/2021   Obesity hypoventilation syndrome (HCC) 04/02/2021   Primary osteoarthritis, left ankle and foot 02/05/2021   Allergy to shellfish 01/15/2021   Decreased estrogen level 01/15/2021   Diabetic renal disease (HCC) 01/15/2021   Gallstones 01/15/2021   Idiopathic gout 01/15/2021   Mild intermittent asthma 01/15/2021   Pure hypercholesterolemia 01/15/2021   CHF (congestive heart failure) (HCC) 01/07/2021   Chronic diastolic heart failure (HCC) 12/31/2020   Nasal congestion with rhinorrhea 10/24/2020   Carotid stenosis 10/01/2020   Ischemic cardiomyopathy 09/27/2020   Hypertension 09/19/2020   Acute left-sided weakness 09/16/2020   Blood blister 08/01/2020   Elevated troponin 06/01/2019   Irritable bowel syndrome with constipation 10/21/2018   Chronic pain 07/30/2018   Congestive heart failure (CHF) (HCC) 07/30/2018   Supplemental oxygen dependent 07/30/2018   Leukopenia 05/03/2018   Thrombocytopenia (HCC) 05/03/2018   Pneumonia due to human metapneumovirus 05/02/2018   Chest pain of uncertain etiology 05/01/2018   Allergic rhinitis 04/13/2018   S/P laparoscopic cholecystectomy 04/09/2018   Respiratory failure (HCC) 03/07/2018   Polyp of cecum    Polyp of transverse colon    Positive colorectal cancer screening using Cologuard test    Dysphagia    Acute on chronic renal failure (HCC) 11/20/2017   Coronary artery disease of native artery of native heart with stable angina pectoris (HCC) 11/20/2017   Diabetes mellitus without complication (HCC) 11/20/2017   Abdominal pain, epigastric 11/20/2017   Vasomotor rhinitis 10/16/2016   Right facial numbness 03/05/2016   ESRD (end stage renal disease) (HCC) 03/05/2016   Atrial  fibrillation (HCC) 10/24/2015   Chest pain with moderate risk of acute coronary syndrome 10/23/2015   History of TIA (transient ischemic attack) 10/22/2015   CHB (complete heart block) (HCC) 06/22/2015   Allergic drug rash due to anti-infective agent 04/29/2015   Fatigue 02/14/2015   Chronic diastolic CHF (congestive heart failure) (HCC) 02/14/2015   Cellulitis 12/21/2014   S/P CABG x 5 11/29/2014   CAD S/P percutaneous coronary angioplasty    Diarrhea 07/12/2014   Abdominal pain 07/12/2014   Acute on chronic diastolic heart failure (HCC) 11/04/2013   Mixed hypercholesterolemia and hypertriglyceridemia 04/22/2013   Vertigo 04/22/2013   Dyspnea on exertion 08/25/2012   Allergic to IV contrast 07/23/2012   Unstable angina (HCC) 07/22/2012   Obesity 11/22/2011   Cardiomyopathy (HCC) 11/22/2011   Gastroesophageal reflux disease 11/22/2011   Back pain 11/22/2011   OSA (obstructive sleep apnea) 11/22/2011   HEMORRHOIDS-EXTERNAL 02/21/2010   NAUSEA 02/21/2010   ABDOMINAL PAIN -GENERALIZED 02/21/2010   PERSONAL HX COLONIC POLYPS 02/21/2010   ANEMIA 04/16/2007   HTN (hypertension), benign 04/16/2007   DIVERTICULOSIS, COLON 04/16/2007   ARTHRITIS 04/16/2007   INTERNAL HEMORRHOIDS WITHOUT MENTION COMP 04/12/2007   Sleep apnea 04/12/2007   Cardiac resynchronization therapy pacemaker (CRT-P) in place 04/12/2007   Gastritis without bleeding 12/14/2006   COLONIC POLYPS, HYPERPLASTIC 09/27/2002   FATTY LIVER DISEASE 02/16/2002   PCP:  Darrow Bussing, MD Pharmacy:   [facility] [place], [place] - [address] [place] [address] Phone: [phone] Fax: [phone]     Social Determinants of Health (SDOH) Interventions    Readmission Risk Interventions    01/10/2021    9:47 AM  Readmission Risk Prevention Plan  Transportation Screening Complete  PCP or Specialist Appt within 3-5 Days Complete  HRI or Home Care Consult Complete  Social  Work Consult for Recovery Care Planning/Counseling Complete  Palliative Care Screening Not Applicable  Medication Review Oceanographer) Complete

## 2021-08-05 NOTE — Progress Notes (Signed)
BLE venous duplex has been completed.   Results can be found under chart review under CV PROC. 08/05/2021 11:38 AM Dartagnan Beavers RVT, RDMS

## 2021-08-05 NOTE — Progress Notes (Signed)
  Echocardiogram 2D Echocardiogram has been performed.  Augustine Radar 08/05/2021, 12:37 PM

## 2021-08-06 DIAGNOSIS — I5041 Acute combined systolic (congestive) and diastolic (congestive) heart failure: Secondary | ICD-10-CM

## 2021-08-06 DIAGNOSIS — N179 Acute kidney failure, unspecified: Secondary | ICD-10-CM | POA: Diagnosis not present

## 2021-08-06 DIAGNOSIS — G4733 Obstructive sleep apnea (adult) (pediatric): Secondary | ICD-10-CM | POA: Diagnosis not present

## 2021-08-06 DIAGNOSIS — Z951 Presence of aortocoronary bypass graft: Secondary | ICD-10-CM | POA: Diagnosis not present

## 2021-08-06 DIAGNOSIS — N189 Chronic kidney disease, unspecified: Secondary | ICD-10-CM

## 2021-08-06 DIAGNOSIS — I2510 Atherosclerotic heart disease of native coronary artery without angina pectoris: Secondary | ICD-10-CM | POA: Diagnosis not present

## 2021-08-06 LAB — CBC
HCT: 31.7 % — ABNORMAL LOW (ref 36.0–46.0)
Hemoglobin: 9.4 g/dL — ABNORMAL LOW (ref 12.0–15.0)
MCH: 31.2 pg (ref 26.0–34.0)
MCHC: 29.7 g/dL — ABNORMAL LOW (ref 30.0–36.0)
MCV: 105.3 fL — ABNORMAL HIGH (ref 80.0–100.0)
Platelets: 149 10*3/uL — ABNORMAL LOW (ref 150–400)
RBC: 3.01 MIL/uL — ABNORMAL LOW (ref 3.87–5.11)
RDW: 13 % (ref 11.5–15.5)
WBC: 5 10*3/uL (ref 4.0–10.5)
nRBC: 0 % (ref 0.0–0.2)

## 2021-08-06 LAB — BASIC METABOLIC PANEL
Anion gap: 10 (ref 5–15)
BUN: 51 mg/dL — ABNORMAL HIGH (ref 8–23)
CO2: 42 mmol/L — ABNORMAL HIGH (ref 22–32)
Calcium: 8.7 mg/dL — ABNORMAL LOW (ref 8.9–10.3)
Chloride: 90 mmol/L — ABNORMAL LOW (ref 98–111)
Creatinine, Ser: 3.28 mg/dL — ABNORMAL HIGH (ref 0.44–1.00)
GFR, Estimated: 14 mL/min — ABNORMAL LOW (ref >60)
Glucose, Bld: 117 mg/dL — ABNORMAL HIGH (ref 70–99)
Potassium: 3.6 mmol/L (ref 3.5–5.1)
Sodium: 142 mmol/L (ref 135–145)

## 2021-08-06 LAB — GLUCOSE, CAPILLARY
Glucose-Capillary: 119 mg/dL — ABNORMAL HIGH (ref 70–99)
Glucose-Capillary: 112 mg/dL — ABNORMAL HIGH (ref 70–99)
Glucose-Capillary: 136 mg/dL — ABNORMAL HIGH (ref 70–99)
Glucose-Capillary: 101 mg/dL — ABNORMAL HIGH (ref 70–99)

## 2021-08-06 LAB — HEPARIN LEVEL (UNFRACTIONATED): Heparin Unfractionated: 0.47 IU/mL (ref 0.30–0.70)

## 2021-08-06 MED ORDER — OXYMETAZOLINE HCL 0.05 % NA SOLN
1.0000 | Freq: Two times a day (BID) | NASAL | Status: AC
Start: 2021-08-06 — End: 2021-08-07
  Administered 2021-08-06 – 2021-08-07 (×4): 1 via NASAL
  Filled 2021-08-06: qty 15

## 2021-08-06 MED ORDER — ONDANSETRON HCL 4 MG/2ML IJ SOLN
4.0000 mg | Freq: Four times a day (QID) | INTRAMUSCULAR | Status: DC | PRN
  Administered 2021-08-06 – 2021-08-07 (×3): 4 mg via INTRAVENOUS
  Filled 2021-08-06 (×3): qty 2

## 2021-08-06 NOTE — Consult Note (Signed)
Cardiology Consultation:   Patient ID: [name] MRN: [id number]; DOB: Oct 15, 1945  Admit date: 08/04/2021 Date of Consult: 08/06/2021  PCP:  Darrow Bussing, MD   [facility] Providers Cardiologist:  Thurmon Fair, MD        Patient Profile:   [name] is a 76 y.o. female with a hx of CAD s/p CABG October 2016 (LIMA to LAD, SVG to D1, SVG to OM1 and OM2, SVG to PDA), diastolic heart failure, morbid obesity, adrenal insufficiency, type 2 diabetes mellitus, Hypertension, stage III chronic kidney disease, gout, obstructive sleep apnea, asthma. Nonischemic cardiomyopathy and heart failure preceded the diagnosis of coronary disease, TIA (left hemiparesis and facial droop 09/16/2020)  and received a right internal carotid stent (TCAR) by Dr. Sherald Hess on October 01, 2020, who is being seen 08/06/2021 for the evaluation of CHF exacerbation and abnormal echo, at the request of Dr. [name] Douglas.   She has complete heart block and had a conventional Environmental manager pacemaker implanted in 2001. This was upgraded to a biventricular ICD implanted in 2008 for severe cardiomyopathy with ejection fraction 20%, downgraded to CRT-P in 2014 [facility]) following excellent response to resynchronization (hyper-responder, normalization of left ventricular systolic function). The current atrial and right ventricular leads were implanted in 2001, the left ventricular lead was implanted in 2008. The defibrillator lead implanted in 2008 is capped and its tip was resected at the time of bypass surgery.   Ever since bypass surgery, atrial pacing thresholds have been extremely high, so her device is programmed VDD.  QRS duration 124 ms when BiV pacing  L/R Cath 03/11/2019 with occlusion of SVG-OM but other bypasses patent and patent stents in the CFX and RI, right heart cath with normal filling pressures.  Lifelong DAPT  She fell and broke her hip and her pelvis on February 22.  Her kidneys  failed for a while at that time, and she was on dialysis.  The dialysis catheter removed 3 weeks ago.   History of Present Illness:   [name] was seen by Dr. Royann Shivers 04/01/2021 and her device was BiV pacing 99% of the time with no atrial fibrillation.  Weight was 227 pounds, torsemide increased to 80 mg daily for volume overload.   Previous ABG showed severe chronic hypoventilation with a PCO2 of 62, intolerant of CPAP, so sleep apnea and obesity hypoventilation syndrome as well as reactive airways disease contributes to her chronic shortness of breath.  With gout, avoid thiazide diuretics.  She was admitted 6/25 with increasing shortness of breath, edema and erythema of both lower extremities, dx CHF exacerbation and possible lower extremity cellulitis.  Echo is abnormal and cardiology asked to see.  [name] states her shortness of breath started getting worse 3 days before admission.  She also noticed increasing lower extremity edema and erythema.  Her legs felt very tight and were very tender.  Her weight has actually been trending down because she has been losing weight, her last weight at home was 219 prior to admission.  She gets chest pain once or twice a week, her usual angina, does not last very long.  The frequency and intensity of the chest pain episodes has not changed, except when the children are fighting.  She is under more stress than usual because she lives with her son and his 2 children who are home for the summer and make a lot of noise.  She is on O2 at 2 L chronically, it was increased to  3 L on arrival and this has helped some as well.   Past Medical History:  Diagnosis Date   AICD (automatic cardioverter/defibrillator) present    downgraded to CRT-P in 2014   Anemia    Arthritis    Asthma    Atrial fibrillation (HCC) 10/24/2015   Questionable history of A Fib/A Flutter. On device interrogation on 9/7, showed 0.62min of A Fib/A Flutter with 1% burden.   Device check in Jan 2018 showed no sustained AF (runs of less than 30 seconds). No anticoagulation indicated.   CAD (coronary artery disease)    a. 10/09/16 LHC: SVG->LAD patent, SVG->Diag patent, SVG->RCA patent, SVG->LCx occluded. EF 60%, b. 10/31/16 LHC DES to AV groove Circ, DES to intermed branch   CHF (congestive heart failure) (HCC)    Chronic lower back pain    Fatty liver disease, nonalcoholic    GERD (gastroesophageal reflux disease)    Gout    H/O hiatal hernia    High cholesterol    Hyperplastic colon polyp    Hypertension    IBS (irritable bowel syndrome)    Internal hemorrhoids    INTERNAL HEMORRHOIDS WITHOUT MENTION COMP 04/12/2007   Qualifier: Diagnosis of  By: Juanda Chance MD, Dora M    Nonischemic cardiomyopathy (HCC) 11/22/2011   Pt responded to BiV ICD- last EF 55-60% Sept 2017   Obesity    OSA (obstructive sleep apnea)    "can't tolerate a mask", wears 2L nocturnal O2   Presence of permanent cardiac pacemaker    11/02/12 [employer] V273 INTUA PPM   Stroke (HCC) 09/2020   Type II diabetes mellitus (HCC)     Past Surgical History:  Procedure Laterality Date   ABDOMINAL ULTRASOUND  12/01/2011   Peripelvic cysts- #1- 2.4x1.9x2.3cm, #2-1.2x0.9x1.2cm   ANKLE FRACTURE SURGERY Right    "had rod put in"   ANTERIOR CERVICAL DECOMP/DISCECTOMY FUSION     APPENDECTOMY     BACK SURGERY     BIOPSY  12/29/2017   Procedure: BIOPSY;  Surgeon: Napoleon Form, MD;  Location: [facility] ENDOSCOPY;  Service: Endoscopy;;   BIV ICD INSERTION CRT-D  2001?   BIV PACEMAKER GENERATOR CHANGE OUT N/A 11/02/2012   Procedure: BIV PACEMAKER GENERATOR CHANGE OUT;  Surgeon: Thurmon Fair, MD;  Location: [facility] CATH LAB;  Service: Cardiovascular;  Laterality: N/A;   CARDIAC CATHETERIZATION  05/17/1999   No significant coronary obstructive disease w/ mild 20% luminal irregularity of the first diag branch of the LAD   CARDIAC CATHETERIZATION  07/08/2002   No significant CAD, moderately depressed LV  systolic function   CARDIAC CATHETERIZATION Bilateral 04/26/2007   Normal findings, recommend medical therapy   CARDIAC CATHETERIZATION  02/18/2008   Moderate CAD, would benefit from having a functional study, recommend continue medical therapy   CARDIAC CATHETERIZATION  07/23/2012   Medical therapy   CARDIAC CATHETERIZATION N/A 11/24/2014   Procedure: Left Heart Cath and Coronary Angiography;  Surgeon: Lennette Bihari, MD;  Location: [facility] INVASIVE CV LAB;  Service: Cardiovascular;  Laterality: N/A;   CARDIAC CATHETERIZATION  11/27/2014   Procedure: Intravascular Pressure Wire/FFR Study;  Surgeon: Peter M Swaziland, MD;  Location: [facility] INVASIVE CV LAB;  Service: Cardiovascular;;   CARDIAC CATHETERIZATION  10/09/2016   CHOLECYSTECTOMY N/A 04/09/2018   Procedure: LAPAROSCOPIC CHOLECYSTECTOMY;  Surgeon: Gaynelle Adu, MD;  Location: [facility] ORS;  Service: General;  Laterality: N/A;   COLONOSCOPY WITH PROPOFOL N/A 12/29/2017   Procedure: COLONOSCOPY WITH PROPOFOL;  Surgeon: Napoleon Form, MD;  Location: [facility]  END[facility];  Service: Endoscopy;  Laterality: N/A;   CORONARY ANGIOGRAM  2010   CORONARY ARTERY BYPASS GRAFT N/A 11/29/2014   Procedure: CORONARY ARTERY BYPASS GRAFTING x 5 (LIMA-LAD, SVG-D, SVG-OM1-OM2, SVG-PD);  Surgeon: Loreli Slot, MD;  Location: [facility] OR;  Service: Open Heart Surgery;  Laterality: N/A;   CORONARY STENT INTERVENTION N/A 10/31/2016   Procedure: CORONARY STENT INTERVENTION;  Surgeon: Kathleene Hazel, MD;  [facility]: [facility] INVAS[facility] CV LAB;  Service: Cardiovascular;  Laterality: N/A;   ESOPHAGOGASTRODUODENOSCOPY (EGD) WITH PROPOFOL N/A 12/29/2017   Procedure: ESOPHAGOGASTRODUODENOSCOPY (EGD) WITH PROPOFOL;  Surgeon: Napoleon Form, MD;  Location: [facility];  Service: Endoscopy;  Laterality: N/A;   FRACTURE SURGERY     INSERT / REPLACE / REMOVE PACEMAKER  1999   INTRAMEDULLARY (IM) NAIL INTERTROCHANTERIC Left 04/04/2021   Procedure: INTRAMEDULLARY (IM) NAIL INTERTROCHANTRIC;   Surgeon: Huel Cote, MD;  Location: [facility] OR;  Service: Orthopedics;  Laterality: Left;   IR FLUORO GUIDE CV LINE LEFT  04/11/2021   IR US GUIDE VASC ACCESS LEFT  04/11/2021   KNEE ARTHROSCOPY Bilateral    LEFT HEART CATH AND CORS/GRAFTS ANGIOGRAPHY N/A 12/09/2017   Procedure: LEFT HEART CATH AND CORS/GRAFTS ANGIOGRAPHY;  Surgeon: Lennette Bihari, MD;  [facility] CV LAB;  Service: Cardiovascular;  Laterality: N/A;   LEFT HEART CATHETERIZATION WITH CORONARY ANGIOGRAM N/A 07/23/2012   Procedure: LEFT HEART CATHETERIZATION WITH CORONARY ANGIOGRAM;  Surgeon: Marykay Lex, MD;  Location: [facility] CATH LAB;  Service: Cardiovascular;  Laterality: N/A;   LEXISCAN MYOVIEW  11/14/2011   Mild-moderate defect seen in Mid Inferolateral and Mid Anterolateral regions-consistant w/ infarct/scar. No significant ischemia demonstrated.   POLYPECTOMY  12/29/2017   Procedure: POLYPECTOMY;  Surgeon: Napoleon Form, MD;  Location: [facility];  Service: Endoscopy;;   PPM GENERATOR CHANGEOUT N/A 12/31/2020   Procedure: PPM GENERATOR CHANGEOUT;  Surgeon: Thurmon Fair, MD;  [facility];  Service: Cardiovascular;  Laterality: N/A;   RIGHT/LEFT HEART CATH AND CORONARY ANGIOGRAPHY N/A 10/09/2016   Procedure: RIGHT/LEFT HEART CATH AND CORONARY ANGIOGRAPHY;  Surgeon: Dolores Patty, MD;  [facility];  Service: Cardiovascular;  Laterality: N/A;   RIGHT/LEFT HEART CATH AND CORONARY/GRAFT ANGIOGRAPHY N/A 03/11/2019   Procedure: RIGHT/LEFT HEART CATH AND CORONARY/GRAFT ANGIOGRAPHY;  Surgeon: Dolores Patty, MD;  [facility] LAB;  Service: Cardiovascular;  Laterality: N/A;   TEE WITHOUT CARDIOVERSION N/A 11/29/2014   Procedure: TRANSESOPHAGEAL ECHOCARDIOGRAM (TEE);  Surgeon: Loreli Slot, MD;  Location: [facility] OR;  Service: Open Heart Surgery;  Laterality: N/A;   TRANSCAROTID ARTERY REVASCULARIZATION  Right 10/01/2020   Procedure: RIGHT TRANSCAROTID ARTERY  REVASCULARIZATION;  Surgeon: Cephus Shelling, MD;  [facility] OR;  Service: Vascular;  Laterality: Right;   TRANSTHORACIC ECHOCARDIOGRAM  07/23/2012   EF 55-60%, normal-mild   TUBAL LIGATION     ULTRASOUND GUIDANCE FOR VASCULAR ACCESS Left 10/01/2020   Procedure: ULTRASOUND GUIDANCE FOR VASCULAR ACCESS;  Surgeon: Cephus Shelling, MD;  Location: [facility] OR;  Service: Vascular;  Laterality: Left;     Home Medications:  Prior to Admission medications   Medication Sig Start Date End Date Taking? Authorizing Provider  acetaminophen (TYLENOL) 500 MG tablet Take 1,000 mg by mouth 3 (three) times daily as needed (for pain or headaches).   Yes [provider]  albuterol (PROVENTIL HFA;VENTOLIN HFA) 108 (90 Base) MCG/ACT inhaler Inhale 2 puffs into the lungs every 6 (six) hours as needed for wheezing or shortness of breath.  05/02/18  Yes Madelyn Flavors A, MD  allopurinol (ZYLOPRIM) 100 MG tablet Take 1 tablet (100 mg total) by mouth daily. Patient taking differently: Take 100 mg by mouth every morning. 03/14/21  Yes Croitoru, [name], MD  aspirin EC 81 MG tablet Take 1 tablet (81 mg total) by mouth daily. Patient taking differently: Take 81 mg by mouth every morning. 03/27/16  Yes Kilroy, Luke K, PA-C  budesonide-formoterol (SYMBICORT) 160-4.5 MCG/ACT inhaler Inhale 2 puffs into the lungs 2 (two) times daily.   Yes [provider]  clopidogrel (PLAVIX) 75 MG tablet Take 1 tablet (75 mg total) by mouth daily. Patient taking differently: Take 75 mg by mouth every morning. 02/28/21  Yes Croitoru, [name], MD  cycloSPORINE (RESTASIS) 0.05 % ophthalmic emulsion Place 1 drop into both eyes 2 (two) times daily.   Yes [provider]  docusate sodium (COLACE) 100 MG capsule Take 100 mg by mouth at bedtime.   Yes [provider]  EPINEPHrine 0.3 mg/0.3 mL IJ SOAJ injection Inject 0.3 mg into the muscle once as needed for anaphylaxis (severe allergic reaction).   Yes [provider]  furosemide (LASIX) 40 MG tablet Take 80 mg by mouth 2 (two) times daily with breakfast and lunch. 07/17/21  Yes [provider]  hydrOXYzine (ATARAX) 25 MG tablet Take 25 mg by mouth 2 (two) times daily as needed for itching. 06/07/21  Yes [provider]  isosorbide mononitrate (IMDUR) 30 MG 24 hr tablet TAKE 3 TABLETS BY MOUTH  DAILY Patient taking differently: Take 90 mg by mouth every morning. 05/15/21  Yes Croitoru, [name], MD  midodrine (PROAMATINE) 10 MG tablet Take 1 tablet (10 mg total) by mouth 3 (three) times daily with meals. 04/28/21  Yes Gherghe, Daylene Katayama, MD  nitroGLYCERIN (NITROSTAT) 0.4 MG SL tablet Place 1 tablet (0.4 mg total) under the tongue every 5 (five) minutes as needed for chest pain. 11/13/16 03/18/22 Yes Kilroy, Luke K, PA-C  ondansetron (ZOFRAN) 4 MG tablet Take 4 mg by mouth daily as needed for nausea or vomiting. 07/24/21  Yes [provider]  OXYGEN Inhale 2-3 L/min into the lungs continuous.   Yes [provider]  oxymetazoline (AFRIN) 0.05 % nasal spray Place 1 spray into both nostrils 2 (two) times daily as needed for congestion.   Yes [provider]  pantoprazole (PROTONIX) 40 MG tablet Take 1 tablet (40 mg total) by mouth 2 (two) times daily. 07/08/19  Yes Nandigam, Eleonore Chiquito, MD  polyethylene glycol (MIRALAX / GLYCOLAX) 17 g packet Take 17 g by mouth daily as needed (constipation).   Yes [provider]  senna-docusate (SENOKOT-S) 8.6-50 MG tablet Take 1 tablet by mouth 2 (two) times daily. 04/28/21  Yes Gherghe, Daylene Katayama, MD  sucralfate (CARAFATE) 1 g tablet Take 1 tablet (1 g total) by mouth 4 (four) times daily -  with meals and at bedtime. Patient taking differently: Take 2 g by mouth 2 (two) times daily. 04/28/21  Yes Gherghe, Daylene Katayama, MD  traMADol (ULTRAM) 50 MG tablet Take 50 mg by mouth daily as needed (pain). 06/07/21  Yes [provider]  ACCU-CHEK AVIVA PLUS test strip Check blood sugar  twice daily 10/19/19   [provider]  Accu-Chek Softclix Lancets lancets Check blood sugar twice daily 08/10/19   [provider]  albuterol (PROVENTIL) (2.5 MG/3ML) 0.083% nebulizer solution Take 3 mLs (2.5 mg total) by nebulization every 6 (six) hours as needed for wheezing or shortness of breath.  Patient not taking: Reported on 08/05/2021 05/02/18   Clydie Braun, MD  Alcohol Swabs (B-D SINGLE USE SWABS REGULAR) PADS  08/10/19   [provider]  clindamycin (CLEOCIN) 150 MG capsule Take 450 mg by mouth every 8 (eight) hours. Patient not taking: Reported on 08/05/2021 07/26/21   [provider]  empagliflozin (JARDIANCE) 10 MG TABS tablet Take 10 mg by mouth daily before breakfast. Patient not taking: Reported on 06/11/2021 06/30/19   Bensimhon, Bevelyn Buckles, MD  HYDROcodone-acetaminophen (NORCO/VICODIN) 5-325 MG tablet Take 1-2 tablets by mouth every 6 (six) hours as needed for moderate pain. Patient not taking: Reported on 08/05/2021 04/28/21   Leatha Gilding, MD  Lidocaine 0.5 % AERO Place 1 spray rectally every 6 (six) hours as needed (hemorrhoids). Preparation H rapid relief lidocaine spray    [provider]  linaclotide (LINZESS) 145 MCG CAPS capsule Take 145 mcg by mouth daily before breakfast. Patient not taking: Reported on 08/05/2021    [provider]  rosuvastatin (CRESTOR) 20 MG tablet Take 1 tablet (20 mg total) by mouth every evening. NEED OV. Patient not taking: Reported on 08/05/2021 06/25/21   Croitoru, Rachelle Hora, MD    Inpatient Medications: Scheduled Meds:  allopurinol  100 mg Oral q morning   aspirin EC  81 mg Oral q morning   clopidogrel  75 mg Oral QHS   cycloSPORINE  1 drop Both Eyes BID   furosemide  80 mg Intravenous Q12H   insulin aspart  0-6 Units Subcutaneous TID WC   mometasone-formoterol  2 puff Inhalation BID   oxymetazoline  1 spray Each Nare BID   rosuvastatin  20 mg Oral QPM   Continuous Infusions:  sodium  chloride Stopped (08/05/21 1207)   ampicillin-sulbactam (UNASYN) IV 3 g (08/05/21 2134)   heparin 1,250 Units/hr (08/05/21 1902)   PRN Meds: sodium chloride, acetaminophen **OR** acetaminophen, albuterol, mouth rinse, polyvinyl alcohol, sodium chloride  Allergies:    Allergies  Allergen Reactions   Ivp Dye [Iodinated Contrast Media] Shortness Of Breath    No reaction to PO contrast with non-ionic dye.06-25-2014/rsm   Shellfish Allergy Anaphylaxis   Sulfa Antibiotics Shortness Of Breath   Iodine Hives   Atorvastatin Other (See Comments)    Pt states "causes bilateral leg pain/cramps."   Benadryl [Diphenhydramine] Other (See Comments)    "THIS DRIVES ME CRAZY AND MAKES ME FEEL LIKE I AM DYING"    Colchicine Nausea And Vomiting   Contrast Media [Iodinated Contrast Media] Hives   Doxycycline Other (See Comments)    Unknown reaction, per patient   Uloric [Febuxostat] Other (See Comments)    Unknown reaction   Cephalexin Itching and Rash   Zithromax [Azithromycin] Rash    Social History:   Social History   Socioeconomic History   Marital status: Widowed    Spouse name: Threase Batterson- deceased   Number of children: 5   Years of education: Not on file   Highest education level: Not on file  Occupational History   Occupation: STAFF/BUFFET    Employer: [employer]  Tobacco Use   Smoking status: Former    Packs/day: 0.25    Years: 3.00    Total pack years: 0.75    Types: Cigarettes    Quit date: 02/11/1968    Years since quitting: 53.5   Smokeless tobacco: Never  Vaping Use   Vaping Use: Never used  Substance and Sexual Activity   Alcohol use: No   Drug use: No  Sexual activity: Never  Other Topics Concern   Not on file  Social History Narrative   Widowed last year. Spouse Angelia Mould Sr (16) died on date 02/07/2012 Mar 14, 2023) at his home Was a [place] [employer] [place]   She lives with her two sons, Alisia Ferrari    Other sons Joseph Berkshire   Social Determinants of Health   Financial Resource Strain: High Risk (12/18/2016)   Overall Financial Resource Strain (CARDIA)    Difficulty of Paying Living Expenses: Very hard  Food Insecurity: No Food Insecurity (01/15/2021)   Hunger Vital Sign    Worried About Running Out of Food in the Last Year: Never true    Ran Out of Food in the Last Year: Never true  Transportation Needs: No Transportation Needs (02/05/2021)   PRAPARE - Administrator, Civil Service (Medical): No    Lack of Transportation (Non-Medical): No  Physical Activity: Inactive (12/18/2016)   Exercise Vital Sign    Days of Exercise per Week: 0 days    Minutes of Exercise per Session: 0 min  Stress: Stress Concern Present (12/18/2016)   [employer] of Occupational Health - Occupational Stress Questionnaire    Feeling of Stress : Very much  Social Connections: Moderately Integrated (02/05/2021)   Social Connection and Isolation Panel [NHANES]    Frequency of Communication with Friends and Family: Three times a week    Frequency of Social Gatherings with Friends and Family: Three times a week    Attends Religious Services: 1 to 4 times per year    Active Member of Clubs or Organizations: Yes    Attends Banker Meetings: 1 to 4 times per year    Marital Status: Widowed  Intimate Partner Violence: Not At Risk (02/05/2021)   Humiliation, Afraid, Rape, and Kick questionnaire    Fear of Current or Ex-Partner: No    Emotionally Abused: No    Physically Abused: No    Sexually Abused: No    Family History:   Family History  Problem Relation Age of Onset   Breast cancer Mother    Diabetes Mother    Heart disease Maternal Grandmother    Kidney disease Maternal Grandmother    Diabetes Maternal Grandmother    Glaucoma Maternal Aunt    Heart disease Maternal Aunt    Asthma Sister    Colon cancer Neg Hx    Stomach cancer Neg Hx    Pancreatic cancer Neg Hx      ROS:  Please  see the history of present illness.  All other ROS reviewed and negative.     Physical Exam/Data:   Vitals:   08/05/21 2005 08/06/21 0500 08/06/21 0554 08/06/21 0805  BP: (!) 142/64  127/74   Pulse: 79  91   Resp: 17  16   Temp: 98.6 F (37 C)  98.2 F (36.8 C)   TempSrc: Oral  Oral   SpO2: 99%  100% 100%  Weight:  101.1 kg    Height:        Intake/Output Summary (Last 24 hours) at 08/06/2021 0941 Last data filed at 08/06/2021 0900 Gross per 24 hour  Intake 852.1 ml  Output 3050 ml  Net -2197.9 ml      08/06/2021    5:00 AM 08/05/2021    5:00 AM 08/04/2021    8:52 PM  Last 3 Weights  Weight (lbs) 222 lb 14.2 oz 222 lb 14.2 oz 221 lb  12.8 oz  Weight (kg) 101.1 kg 101.1 kg 100.608 kg     Body mass index is 39.48 kg/m.  General: Chronically ill appearing female, in no acute distress at rest and on O2 HEENT: normal for age Neck:  JVD 10 cm Vascular: No carotid bruits; Distal pulses 2+ bilaterally Cardiac:  normal S1, S2; irregular rate and rhythm; no murmur  Lungs:  clear to auscultation bilaterally, no wheezing, rhonchi or rales  Abd: soft, nontender, no hepatomegaly  Ext: 1+ lower extremity edema Musculoskeletal:  No deformities, BUE and BLE strength normal and equal Skin: warm and dry  Neuro:  CNs 2-12 intact, no focal abnormalities noted Psych:  Normal affect   EKG:  The EKG was personally reviewed and demonstrates: 6/25 ECG is sinus rhythm with V pacing, PVC and 1 intrinsic complex noted, QRS duration 154 ms Telemetry:  Telemetry was personally reviewed and demonstrates: Sinus rhythm, V pacing, P waves not always visible  Relevant CV Studies:  ECHO: 08/05/2021  1. Technically difficult study. Left ventricular ejection fraction, by  estimation, is 45 to 50%. The left ventricle has mildly decreased  function. The left ventricle demonstrates regional wall motion  abnormalities (see scoring diagram/findings for description). Apical hypokinesis. There is mild left  ventricular hypertrophy. Left ventricular diastolic parameters are indeterminate.   2. Right ventricular systolic function is normal. The right ventricular  size is normal. There is mildly elevated pulmonary artery systolic  pressure. The estimated right ventricular systolic pressure is 41.8 mmHg.   3. The mitral valve is degenerative. No evidence of mitral valve  regurgitation. Moderate mitral annular calcification.   4. The aortic valve was not well visualized. There is moderate  calcification of the aortic valve. Aortic valve regurgitation is trivial.  Mild aortic valve stenosis. Vmax 2.5 m/s, MG 13 mmHg, AVA 2.0 cm^2, DI 0.48   5. The inferior vena cava is dilated in size with <50% respiratory  variability, suggesting right atrial pressure of 15 mmHg.   CARDIAC CATH 03/11/2019        Ost 1st Diag lesion is 60% stenosed. Ost 2nd Diag lesion is 50% stenosed. Mid LAD lesion is 100% stenosed. Non-stenotic Ost Cx to Mid Cx lesion was previously treated. Prox RCA lesion is 100% stenosed. Non-stenotic Ost Ramus to Ramus lesion was previously treated. Origin to Prox Graft lesion before Ramus is 100% stenosed.   Findings:   Ao = 136/68 (95)  LV =  155/11 RA =  7 RV = 34/9 PA = 34/17 (24) PCW = 14 Fick cardiac output/index = 6.8/3.1 PVR = 1.5 WU FA sat = 96% PA sat = 65%, 75%, 69%   Assessment: 1. Stable coronary anatomy with no change from previous. SVG to OM system chronically occluded with previous PCI of native LCX system. Remainder of grafts are widely patent 2. Very mild PAH with normal left-sided pressures. 3. Very difficult procedure given patient's size and fidgeting on cath table Diagnostic Dominance: Right     Laboratory Data:  High Sensitivity Troponin:   Recent Labs  Lab 08/04/21 1551 08/04/21 1800  TROPONINIHS 30* 30*     Chemistry Recent Labs  Lab 08/04/21 1551 08/05/21 0747 08/06/21 0341  NA 140 141 142  K 3.3* 3.5 3.6  CL 88* 89* 90*  CO2 40* 40*  42*  GLUCOSE 129* 180* 117*  BUN 45* 45* 51*  CREATININE 3.38* 3.05* 3.28*  CALCIUM 9.4 9.4 8.7*  GFRNONAA 13* 15* 14*  ANIONGAP 12 12 10  Recent Labs  Lab 08/04/21 1630  PROT 6.1*  ALBUMIN 3.1*  AST 14*  ALT 11  ALKPHOS 88  BILITOT 0.6   Lipids No results for input(s): "CHOL", "TRIG", "HDL", "LABVLDL", "LDLCALC", "CHOLHDL" in the last 168 hours.  Hematology Recent Labs  Lab 08/04/21 1616 08/05/21 0747 08/06/21 0341  WBC 6.7 4.7 5.0  RBC 3.28* 3.41* 3.01*  HGB 10.2* 10.5* 9.4*  HCT 34.8* 36.1 31.7*  MCV 106.1* 105.9* 105.3*  MCH 31.1 30.8 31.2  MCHC 29.3* 29.1* 29.7*  RDW 13.2 13.1 13.0  PLT 167 143* 149*   Thyroid No results for input(s): "TSH", "FREET4" in the last 168 hours.  BNP Recent Labs  Lab 08/04/21 1430  BNP 321.7*    DDimer  Recent Labs  Lab 08/04/21 2013  DDIMER 2.34*     Radiology/Studies:  VAS [place] LOWER EXTREMITY VENOUS (DVT)  Result Date: 08/05/2021  Lower Venous DVT Study Patient Name:  [name]  Date of Exam:   08/05/2021 Medical Rec #: [id number]          Accession #:    [id number] Date of Birth: [birth year]           Patient Gender: F Patient Age:   42 years Exam Location:  [facility] Procedure:      VAS [place] LOWER EXTREMITY VENOUS (DVT) Referring Phys: Midge Minium --------------------------------------------------------------------------------  Indications: Pain, edema, redness, and warmth to BLE (cellulitis).  Limitations: Body habitus and poor ultrasound/tissue interface. Comparison Study: Previous exams on 04/13/21, 02/14/21, 06/08/18 ([facility]), 10/15/18,                   09/29/17, 01/16/17, 04/29/15, 12/22/14, & 07/01/00 were ALL                   negative for DVT. Performing Technologist: Ernestene Mention RVT, RDMS  Examination Guidelines: A complete evaluation includes B-mode imaging, spectral Doppler, color Doppler, and power Doppler as needed of all accessible portions of each vessel. Bilateral testing is considered an integral part of  a complete examination. Limited examinations for reoccurring indications may be performed as noted. The reflux portion of the exam is performed with the patient in reverse Trendelenburg.  +---------+---------------+---------+-----------+----------+-------------------+ RIGHT    CompressibilityPhasicitySpontaneityPropertiesThrombus Aging      +---------+---------------+---------+-----------+----------+-------------------+ CFV      Full           No       Yes                                      +---------+---------------+---------+-----------+----------+-------------------+ SFJ      Full                                                             +---------+---------------+---------+-----------+----------+-------------------+ FV Prox  Full           No       Yes                                      +---------+---------------+---------+-----------+----------+-------------------+ FV Mid   Full           No  Yes                                      +---------+---------------+---------+-----------+----------+-------------------+ FV DistalFull           No       Yes                                      +---------+---------------+---------+-----------+----------+-------------------+ PFV      Full                                                             +---------+---------------+---------+-----------+----------+-------------------+ POP      Full           No       Yes                                      +---------+---------------+---------+-----------+----------+-------------------+ PTV      Full                                         Not well visualized +---------+---------------+---------+-----------+----------+-------------------+ PERO                                                  Not visualized      +---------+---------------+---------+-----------+----------+-------------------+   Right Technical Findings: Not visualized segments include  Peroneal veins. Pulsatile flow noted in all doppler waveforms of RLE.  +--------+---------------+---------+-----------+----------+--------------------+ LEFT    CompressibilityPhasicitySpontaneityPropertiesThrombus Aging       +--------+---------------+---------+-----------+----------+--------------------+ CFV     Full           No       Yes                                       +--------+---------------+---------+-----------+----------+--------------------+ SFJ     Full                                                              +--------+---------------+---------+-----------+----------+--------------------+ FV Prox Full           Yes      Yes                                       +--------+---------------+---------+-----------+----------+--------------------+ FV Mid                 Yes      Yes  patent by                                                                 color/doppler        +--------+---------------+---------+-----------+----------+--------------------+ FV                     Yes      Yes                  patent by            Distal                                               color/doppler        +--------+---------------+---------+-----------+----------+--------------------+ PFV     Full                                                              +--------+---------------+---------+-----------+----------+--------------------+ POP     Full           Yes      Yes                                       +--------+---------------+---------+-----------+----------+--------------------+ PTV                                                  Not well visualized  +--------+---------------+---------+-----------+----------+--------------------+ PERO    Full                                         Not well visualized  +--------+---------------+---------+-----------+----------+--------------------+   Left  Technical Findings: Pulsatile flow noted in CFV   Summary: BILATERAL: - No evidence of deep vein thrombosis seen in the lower extremities, bilaterally. -No evidence of popliteal cyst, bilaterally. - Subcutaneous edema seen in area of calf & ankle, bilaterally.   *See table(s) above for measurements and observations. Electronically signed by Lemar Livings MD on 08/05/2021 at 3:15:51 PM.    Final    ECHOCARDIOGRAM COMPLETE  Result Date: 08/05/2021    ECHOCARDIOGRAM REPORT   Patient Name:   [name] Date of Exam: 08/05/2021 Medical Rec #:  [id number]         Height:       63.0 in Accession #:    [id number]        Weight:       222.9 lb Date of Birth:  [birth year]          BSA:          2.025 m Patient Age:    82 years  BP:           122/66 mmHg Patient Gender: F                 HR:           97 bpm. Exam Location:  Inpatient Procedure: 2D Echo, Cardiac Doppler and Color Doppler Indications:    Dyspnea R06.00  History:        Patient has prior history of Echocardiogram examinations, most                 recent 09/17/2020. Cardiomyopathy and CHF, CAD, Pacemaker, Stroke,                 Arrythmias:Atrial Fibrillation, Signs/Symptoms:Dyspnea; Risk                 Factors:Hypertension, GERD, Diabetes and Sleep Apnea.  Sonographer:    Eulah Pont RDCS Referring Phys: [id number] Monica Martinez [facility]  Sonographer Comments: Patient is morbidly obese. IMPRESSIONS  1. Technically difficult study. Left ventricular ejection fraction, by estimation, is 45 to 50%. The left ventricle has mildly decreased function. The left ventricle demonstrates regional wall motion abnormalities (see scoring diagram/findings for description). Apical hypokinesis. There is mild left ventricular hypertrophy. Left ventricular diastolic parameters are indeterminate.  2. Right ventricular systolic function is normal. The right ventricular size is normal. There is mildly elevated pulmonary artery systolic pressure. The estimated right  ventricular systolic pressure is 41.8 mmHg.  3. The mitral valve is degenerative. No evidence of mitral valve regurgitation. Moderate mitral annular calcification.  4. The aortic valve was not well visualized. There is moderate calcification of the aortic valve. Aortic valve regurgitation is trivial. Mild aortic valve stenosis. Vmax 2.5 m/s, MG 13 mmHg, AVA 2.0 cm^2, DI 0.48  5. The inferior vena cava is dilated in size with <50% respiratory variability, suggesting right atrial pressure of 15 mmHg. FINDINGS  Left Ventricle: Left ventricular ejection fraction, by estimation, is 45 to 50%. The left ventricle has mildly decreased function. The left ventricle demonstrates regional wall motion abnormalities. The left ventricular internal cavity size was normal in size. There is mild left ventricular hypertrophy. Left ventricular diastolic parameters are indeterminate.  LV Wall Scoring: The apical lateral segment, apical inferior segment, and apex are hypokinetic. The entire anterior wall, antero-lateral wall, entire septum, inferior wall, and posterior wall are normal. Right Ventricle: The right ventricular size is normal. Right vetricular wall thickness was not well visualized. Right ventricular systolic function is normal. There is mildly elevated pulmonary artery systolic pressure. The tricuspid regurgitant velocity  is 2.59 m/s, and with an assumed right atrial pressure of 15 mmHg, the estimated right ventricular systolic pressure is 41.8 mmHg. Left Atrium: Left atrial size was normal in size. Right Atrium: Right atrial size was normal in size. Pericardium: There is no evidence of pericardial effusion. Mitral Valve: The mitral valve is degenerative in appearance. Moderate mitral annular calcification. No evidence of mitral valve regurgitation. Tricuspid Valve: The tricuspid valve is normal in structure. Tricuspid valve regurgitation is trivial. Aortic Valve: The aortic valve was not well visualized. There is moderate  calcification of the aortic valve. Aortic valve regurgitation is trivial. Mild aortic stenosis is present. Aortic valve mean gradient measures 11.7 mmHg. Aortic valve peak gradient  measures 22.9 mmHg. Aortic valve area, by VTI measures 2.03 cm. Pulmonic Valve: The pulmonic valve was not well visualized. Pulmonic valve regurgitation is trivial. Aorta: The aortic root is normal in size and structure. Venous: The inferior vena  cava is dilated in size with less than 50% respiratory variability, suggesting right atrial pressure of 15 mmHg. IAS/Shunts: The interatrial septum was not well visualized.  LEFT VENTRICLE PLAX 2D LVIDd:         4.80 cm   Diastology LVIDs:         3.00 cm   LV e' medial:    4.56 cm/s LV PW:         1.20 cm   LV E/e' medial:  21.8 LV IVS:        1.10 cm   LV e' lateral:   6.75 cm/s LVOT diam:     2.30 cm   LV E/e' lateral: 14.7 LV SV:         96 LV SV Index:   47 LVOT Area:     4.15 cm  RIGHT VENTRICLE RV S prime:     5.68 cm/s TAPSE (M-mode): 1.5 cm LEFT ATRIUM             Index        RIGHT ATRIUM           Index LA diam:        3.70 cm 1.83 cm/m   RA Area:     13.00 cm LA Vol (A2C):   44.9 ml 22.18 ml/m  RA Volume:   26.40 ml  13.04 ml/m LA Vol (A4C):   75.0 ml 37.04 ml/m LA Biplane Vol: 64.2 ml 31.71 ml/m  AORTIC VALVE AV Area (Vmax):    1.68 cm AV Area (Vmean):   1.88 cm AV Area (VTI):     2.03 cm AV Vmax:           239.33 cm/s AV Vmean:          158.333 cm/s AV VTI:            0.473 m AV Peak Grad:      22.9 mmHg AV Mean Grad:      11.7 mmHg LVOT Vmax:         97.00 cm/s LVOT Vmean:        71.800 cm/s LVOT VTI:          0.231 m LVOT/AV VTI ratio: 0.49  AORTA Ao Root diam: 3.70 cm Ao Asc diam:  3.70 cm MITRAL VALVE                TRICUSPID VALVE MV Area (PHT): 4.06 cm     TR Peak grad:   26.8 mmHg MV Decel Time: 187 msec     TR Vmax:        259.00 cm/s MV E velocity: 99.20 cm/s MV A velocity: 145.00 cm/s  SHUNTS MV E/A ratio:  0.68         Systemic VTI:  0.23 m                              Systemic Diam: 2.30 cm Epifanio Lesches MD Electronically signed by Epifanio Lesches MD Signature Date/Time: 08/05/2021/2:26:03 PM    Final    DG Chest 2 View  Result Date: 08/04/2021 CLINICAL DATA:  Shortness of breath. EXAM: CHEST - 2 VIEW COMPARISON:  Multiple chest x-rays since April 12, 2021 FINDINGS: Increased opacity in the left base with streaking components. Stable pacemaker/AICD device. Stable cardiomediastinal silhouette. No pneumothorax. No nodules or masses. No overt edema. IMPRESSION: Increased opacity in the left base may represent atelectasis or developing infiltrate/pneumonia. Recommend  clinical correlation with short-term follow-up imaging to ensure resolution. Electronically Signed   By: Gerome Sam III M.D.   On: 08/04/2021 16:24     Assessment and Plan:   Acute on chronic diastolic CHF -- previous echo showed an EF of 55-60% with no wall motion abnormalities and grade 1 diastolic dysfunction. -Echo this admission with EF 45-50% and apical hypokinesis, diastolic parameters indeterminate and RVSP 41.8 -She is currently on IV Lasix 80 mg every 12 hours - Patient says her weight was 219, she was 230 on admission, and is down to 222 pounds.  2.  CKD V -Creatinine was 3.38 on admission, then 3.05, now 3.28 - After she broke her hip and pelvis, her creatinine peaked at 7.05, causing her to be on dialysis temporarily. - After dialysis, her creatinine improved with a creatinine in the mid twos in May. - Her BUN is trending up at 51, it is usually in the 30s or less -Discuss with MD if we should change her to oral Lasix  3.  CHB with BSci PPM - Her heart rate is very irregular, we will get her device interrogated to make sure she is not having any A-fib  4.  CAD: - Previous cath report as above, she could have small vessel disease, but no good substrate for angina unless something has happened - Troponin 30x2, not consistent with ACS - EF is slightly down and  apical wall motion abnormality noted - However, her LIMA-LAD was patent with no significant distal disease mentioned at cath 2 years ago - Discuss the need for ischemic evaluation with MD.   Risk Assessment/Risk Scores:       New York Heart Association (NYHA) Functional Class NYHA Class IV     For questions or updates, please contact [facility] Please consult [email or web address] for contact info under    Signed, Theodore Demark, PA-C  08/06/2021 9:41 AM

## 2021-08-07 DIAGNOSIS — G4733 Obstructive sleep apnea (adult) (pediatric): Secondary | ICD-10-CM | POA: Diagnosis not present

## 2021-08-07 DIAGNOSIS — I2510 Atherosclerotic heart disease of native coronary artery without angina pectoris: Secondary | ICD-10-CM | POA: Diagnosis not present

## 2021-08-07 DIAGNOSIS — N186 End stage renal disease: Secondary | ICD-10-CM | POA: Diagnosis not present

## 2021-08-07 DIAGNOSIS — I5041 Acute combined systolic (congestive) and diastolic (congestive) heart failure: Secondary | ICD-10-CM | POA: Diagnosis not present

## 2021-08-07 DIAGNOSIS — Z951 Presence of aortocoronary bypass graft: Secondary | ICD-10-CM | POA: Diagnosis not present

## 2021-08-07 LAB — GLUCOSE, CAPILLARY
Glucose-Capillary: 112 mg/dL — ABNORMAL HIGH (ref 70–99)
Glucose-Capillary: 119 mg/dL — ABNORMAL HIGH (ref 70–99)
Glucose-Capillary: 221 mg/dL — ABNORMAL HIGH (ref 70–99)
Glucose-Capillary: 170 mg/dL — ABNORMAL HIGH (ref 70–99)
Glucose-Capillary: 135 mg/dL — ABNORMAL HIGH (ref 70–99)

## 2021-08-07 LAB — CBC
HCT: 32.7 % — ABNORMAL LOW (ref 36.0–46.0)
Hemoglobin: 9.6 g/dL — ABNORMAL LOW (ref 12.0–15.0)
MCH: 31.2 pg (ref 26.0–34.0)
MCHC: 29.4 g/dL — ABNORMAL LOW (ref 30.0–36.0)
MCV: 106.2 fL — ABNORMAL HIGH (ref 80.0–100.0)
Platelets: 145 10*3/uL — ABNORMAL LOW (ref 150–400)
RBC: 3.08 MIL/uL — ABNORMAL LOW (ref 3.87–5.11)
RDW: 12.9 % (ref 11.5–15.5)
WBC: 5.1 10*3/uL (ref 4.0–10.5)
nRBC: 0 % (ref 0.0–0.2)

## 2021-08-07 LAB — BASIC METABOLIC PANEL
Anion gap: 12 (ref 5–15)
BUN: 43 mg/dL — ABNORMAL HIGH (ref 8–23)
CO2: 40 mmol/L — ABNORMAL HIGH (ref 22–32)
Calcium: 9 mg/dL (ref 8.9–10.3)
Chloride: 91 mmol/L — ABNORMAL LOW (ref 98–111)
Creatinine, Ser: 2.94 mg/dL — ABNORMAL HIGH (ref 0.44–1.00)
GFR, Estimated: 16 mL/min — ABNORMAL LOW (ref >60)
Glucose, Bld: 112 mg/dL — ABNORMAL HIGH (ref 70–99)
Potassium: 3 mmol/L — ABNORMAL LOW (ref 3.5–5.1)
Sodium: 143 mmol/L (ref 135–145)

## 2021-08-07 LAB — HEPARIN LEVEL (UNFRACTIONATED): Heparin Unfractionated: 0.28 IU/mL — ABNORMAL LOW (ref 0.30–0.70)

## 2021-08-07 MED ORDER — HEPARIN SODIUM (PORCINE) 5000 UNIT/ML IJ SOLN
5000.0000 [IU] | Freq: Three times a day (TID) | INTRAMUSCULAR | Status: DC
  Administered 2021-08-07 – 2021-08-15 (×24): 5000 [IU] via SUBCUTANEOUS
  Filled 2021-08-07 (×24): qty 1

## 2021-08-07 MED ORDER — ISOSORBIDE MONONITRATE ER 60 MG PO TB24
90.0000 mg | ORAL_TABLET | Freq: Every day | ORAL | Status: DC
  Administered 2021-08-07 – 2021-08-15 (×9): 90 mg via ORAL
  Filled 2021-08-07 (×9): qty 1

## 2021-08-07 MED ORDER — HEPARIN (PORCINE) 25000 UT/250ML-% IV SOLN
1350.0000 [IU]/h | INTRAVENOUS | Status: DC
  Administered 2021-08-07: 1350 [IU]/h via INTRAVENOUS
  Filled 2021-08-07: qty 250

## 2021-08-07 MED ORDER — CETAPHIL MOISTURIZING EX LOTN
TOPICAL_LOTION | CUTANEOUS | Status: DC | PRN
  Filled 2021-08-07: qty 473

## 2021-08-07 MED ORDER — LOPERAMIDE HCL 2 MG PO CAPS
4.0000 mg | ORAL_CAPSULE | Freq: Once | ORAL | Status: AC
  Administered 2021-08-07: 4 mg via ORAL
  Filled 2021-08-07: qty 2

## 2021-08-07 NOTE — Progress Notes (Signed)
ANTICOAGULATION CONSULT NOTE - Follow Up Consult  Pharmacy Consult for Heparin Indication: suspected pulmonary embolus  Allergies  Allergen Reactions   Ivp Dye [Iodinated Contrast Media] Shortness Of Breath    No reaction to PO contrast with non-ionic dye.06-25-2014/rsm   Shellfish Allergy Anaphylaxis   Sulfa Antibiotics Shortness Of Breath   Iodine Hives   Atorvastatin Other (See Comments)    Pt states "causes bilateral leg pain/cramps."   Benadryl [Diphenhydramine] Other (See Comments)    "THIS DRIVES ME CRAZY AND MAKES ME FEEL LIKE I AM DYING"    Colchicine Nausea And Vomiting   Contrast Media [Iodinated Contrast Media] Hives   Doxycycline Other (See Comments)    Unknown reaction, per patient   Uloric [Febuxostat] Other (See Comments)    Unknown reaction   Cephalexin Itching and Rash   Zithromax [Azithromycin] Rash    Patient Measurements: Height: '5\' 3"'$  (160 cm) Weight: 101.1 kg (222 lb 14.2 oz) IBW/kg (Calculated) : 52.4 Heparin Dosing Weight: 76 kg  Vital Signs: Temp: 98.2 F (36.8 C) (06/28 0505) Temp Source: Oral (06/28 0505) BP: 138/84 (06/28 0505) Pulse Rate: 95 (06/28 0505)  Labs: Recent Labs    08/04/21 1551 08/04/21 1616 08/04/21 1800 08/05/21 0747 08/05/21 1540 08/06/21 0341 08/07/21 0358  HGB  --    < >  --  10.5*  --  9.4* 9.6*  HCT  --    < >  --  36.1  --  31.7* 32.7*  PLT  --    < >  --  143*  --  149* 145*  HEPARINUNFRC  --    < >  --  0.45 0.35 0.47 0.28*  CREATININE 3.38*  --   --  3.05*  --  3.28* 2.94*  TROPONINIHS 30*  --  30*  --   --   --   --    < > = values in this interval not displayed.     Estimated Creatinine Clearance: 18.2 mL/min (A) (by C-G formula based on SCr of 2.94 mg/dL (H)).   Assessment: 23 yoF admitted on 6/25 with SOB, bilateral LE swelling and erythema.  PMH is significant for Afib (no anticoagulation, but is on Plavix/ASA), CVA, diastolic CHF, HTN, CAD, pacemaker, DM2, HLD, CKD, home O2 usually 2L, and  recently completed dialysis (temporary from February through June s/p AoCKD from surgery, access removed).    D-dimer elevated at 2.34, dopplers neg for DVT bilaterally; VQ scan ordered but patient currently refusing.  No baseline coags. Pharmacy is consulted to dose Heparin for r/o PE while awaiting VTE studies.     Today, 08/07/2021:  Heparin level 0.28> slightly sub-therapeutic on 1200 units/hr CBC: Hgb remains low but stable 9.6,  and Plt low but stable 145K No bleeding or infusion complications reported   SCr 3.28>> 2.94  Goal of Therapy:  Heparin level 0.3-0.7 units/ml Monitor platelets by anticoagulation protocol: Yes   Plan:  increase heparin IV infusion to 1350 units/hr and check 8 hr heparin level Daily heparin level and CBC Monitor for s/s of bleeding or worsening thrombosis Follow up results of VQ scan if patient allows and plans for long-term anticoagulation  Eudelia Bunch, Pharm.D 08/07/2021 5:51 AM

## 2021-08-07 NOTE — Progress Notes (Signed)
Pharmacy Antibiotic Note  MICHOL EMORY is a 76 y.o. female admitted on 08/04/2021 with cellulitis.  Pharmacy has been consulted for Unasyn dosing.  She recently completed course of Cipro outpatient, but still has erythema, warmth of BLE.  Noted cephalexin allergy, however she has previously tolerated oral/IV PCN antibiotics. Stage V CKD - Recently on dialysis but HD catheter removed ~ 3 weeks ago.  Plan: Continue Unasyn 3gm IV q12h  Pharmacy to sign off notes, but will continue to follow up peripherally for renal dosing.     Height: '5\' 3"'$  (160 cm) Weight: 99.2 kg (218 lb 11.1 oz) IBW/kg (Calculated) : 52.4  Temp (24hrs), Avg:98.3 F (36.8 C), Min:98.2 F (36.8 C), Max:98.4 F (36.9 C)  Recent Labs  Lab 08/04/21 1551 08/04/21 1616 08/05/21 0747 08/06/21 0341 08/07/21 0358  WBC  --  6.7 4.7 5.0 5.1  CREATININE 3.38*  --  3.05* 3.28* 2.94*     Estimated Creatinine Clearance: 18 mL/min (A) (by C-G formula based on SCr of 2.94 mg/dL (H)).    Allergies  Allergen Reactions   Ivp Dye [Iodinated Contrast Media] Shortness Of Breath    No reaction to PO contrast with non-ionic dye.06-25-2014/rsm   Shellfish Allergy Anaphylaxis   Sulfa Antibiotics Shortness Of Breath   Iodine Hives   Atorvastatin Other (See Comments)    Pt states "causes bilateral leg pain/cramps."   Benadryl [Diphenhydramine] Other (See Comments)    "THIS DRIVES ME CRAZY AND MAKES ME FEEL LIKE I AM DYING"    Colchicine Nausea And Vomiting   Contrast Media [Iodinated Contrast Media] Hives   Doxycycline Other (See Comments)    Unknown reaction, per patient   Uloric [Febuxostat] Other (See Comments)    Unknown reaction   Cephalexin Itching and Rash   Zithromax [Azithromycin] Rash    Antimicrobials this admission: 6/25 Unasyn >>   Dose adjustments this admission:  Microbiology results:  Thank you for allowing pharmacy to be a part of this patient's care.  Gretta Arab PharmD, BCPS Clinical  Pharmacist [facility] main pharmacy [phone] 08/07/2021 9:28 AM

## 2021-08-07 NOTE — Progress Notes (Addendum)
Progress Note  Patient: [name] SLH:[id number] DOB: [birth year]  DOA: 08/04/2021  DOS: 08/07/2021    Brief hospital course: ABRIANA SALTOS is a 76 y.o. female with a history of chronic diastolic CHF, CAD s/p CABG, CHB s/p pacemaker who was recently on dialysis for ATN and has been off dialysis and dialysis catheter removed about 3 weeks ago, recently treated for lower extremity cellulitis who presented to the ED 6/25 with worsening dyspnea, lower extremity edema and erythema. BNP was elevated, CXR nonspecific left base opacity favored to represent atelectasis. LVEF 45-50% by echo. IV lasix was started and the patient was admitted with cardiology consultation for acute CHF. D-dimer was positive, though suspicion for PE is low.  Assessment and Plan: Acute on chronic hypoxic respiratory failure due to acute on chronic combined HFrEF: Pt is s/p CRT-P with improvement in LVEF with pacing and GDMT, now 45-50% - Continue lasix '80mg'$  IV BID, monitoring I/O (-3.5L/24hrs), daily weights (down 101.1 > 99.2kg).  - Wean back to home 2L O2 as tolerated.    Stage IV CKD: Recently on HD due to ATN developing postoperatively after hip fracture and replacement, with subsequent renal recovery.  - Monitor BMP, Cr is improving with return closer to euvolemia.  - Avoid nephrotoxins, continue routine nephrology follow up.   Bilateral lower leg cellulitis: Treated as such since arrival with reported improvement. Tx with unasyn is limited by medication intolerances/allergies.  - With improvement, and diarrhea likely associated with abx, will plan to limit Tx to 5 days. If needing to continue, would change to alternative agent to avoid the salt load and preferably narrow scope.    CAD s/p CABG, HLD with demand myocardial ischemia: Troponin trend is flat without anginal complaint.  - No inpatient work up indicated - Continue home DAPT, high-intensity statin, imdur - Check lipid panel in AM   PAF:  - Device  interrogation per cardiology.  - Not on anticoagulation due to DAPT and low burden of AFib.  HTN, hypotension: Stable off home anti-hypertensive and midodrine.  - Plan to restart imdur per cardiology, may need midodrine restarted as well, but will see.    NIDT2DM: HbA1c 6.3%.  - Continue SSI. Hold home medications while admitted.   OSA: Nonadherent to CPAP at home - Encouraged CPAP qHS.    Elevated d-dimer: LE venous doppler negative for DVT. Clinical scenario not strongly suggestive of PE, so will stop empiric heparin as she is at risk of bleeding and declines further PE evaluation at this time.   Anemia of chronic kidney disease: At baseline without bleeding. - Macrocytic, check anemia panel  Thrombocytopenia:  - Monitor in AM.   Obesity: Estimated body mass index is 38.74 kg/m as calculated from the following:   Height as of this encounter: '5\' 3"'$  (1.6 m).   Weight as of this encounter: 99.2 kg.  Subjective: Having soft stools, several per day. Not watery. No significant abdominal tenderness. Dyspnea improving. Reports redness and tenderness in lower legs is improving as well. No fevers or chills. No bleeding.   Objective: Vitals:   08/07/21 0500 08/07/21 0505 08/07/21 0800 08/07/21 1320  BP:  138/84  (!) 116/52  Pulse:  95  98  Resp:  18 (!) 21 20  Temp:  98.2 F (36.8 C)  98.4 F (36.9 C)  TempSrc:  Oral  Oral  SpO2:  100% 100% 98%  Weight: 99.2 kg     Height:       Gen: Obese elderly  female in no distress Pulm: Nonlabored breathing supplemental oxygen. Clear anterolaterally. CV: Regular without murmur, rub, or gallop. UTD JVD, 1+ symmetric BL LE dependent edema. GI: Abdomen soft, non-tender, non-distended, with normoactive bowel sounds.  Ext: Warm, no deformities Skin: Lower leg violaceous erythema anteriorly is improved, modestly tender without induration or fluctuance. No other rashes, lesions or ulcers on visualized skin. Neuro: Alert and oriented. No focal  neurological deficits. Psych: Judgement and insight appear fair. Mood euthymic & affect congruent. Behavior is appropriate.    Data Personally reviewed: CBC: Recent Labs  Lab 08/04/21 1616 08/05/21 0747 08/06/21 0341 08/07/21 0358  WBC 6.7 4.7 5.0 5.1  NEUTROABS 4.7  --   --   --   HGB 10.2* 10.5* 9.4* 9.6*  HCT 34.8* 36.1 31.7* 32.7*  MCV 106.1* 105.9* 105.3* 106.2*  PLT 167 143* 149* 086*   Basic Metabolic Panel: Recent Labs  Lab 08/04/21 1551 08/05/21 0747 08/06/21 0341 08/07/21 0358  NA 140 141 142 143  K 3.3* 3.5 3.6 3.0*  CL 88* 89* 90* 91*  CO2 40* 40* 42* 40*  GLUCOSE 129* 180* 117* 112*  BUN 45* 45* 51* 43*  CREATININE 3.38* 3.05* 3.28* 2.94*  CALCIUM 9.4 9.4 8.7* 9.0   GFR: Estimated Creatinine Clearance: 18 mL/min (A) (by C-G formula based on SCr of 2.94 mg/dL (H)). Liver Function Tests: Recent Labs  Lab 08/04/21 1630  AST 14*  ALT 11  ALKPHOS 88  BILITOT 0.6  PROT 6.1*  ALBUMIN 3.1*   No results for input(s): "LIPASE", "AMYLASE" in the last 168 hours. No results for input(s): "AMMONIA" in the last 168 hours. Coagulation Profile: No results for input(s): "INR", "PROTIME" in the last 168 hours. Cardiac Enzymes: No results for input(s): "CKTOTAL", "CKMB", "CKMBINDEX", "TROPONINI" in the last 168 hours. BNP (last 3 results) No results for input(s): "PROBNP" in the last 8760 hours. HbA1C: No results for input(s): "HGBA1C" in the last 72 hours. CBG: Recent Labs  Lab 08/06/21 1152 08/06/21 1704 08/06/21 2114 08/07/21 0826 08/07/21 1140  GLUCAP 112* 136* 101* 112* 119*   Lipid Profile: No results for input(s): "CHOL", "HDL", "LDLCALC", "TRIG", "CHOLHDL", "LDLDIRECT" in the last 72 hours. Thyroid Function Tests: No results for input(s): "TSH", "T4TOTAL", "FREET4", "T3FREE", "THYROIDAB" in the last 72 hours. Anemia Panel: No results for input(s): "VITAMINB12", "FOLATE", "FERRITIN", "TIBC", "IRON", "RETICCTPCT" in the last 72 hours. Urine  analysis:    Component Value Date/Time   COLORURINE YELLOW 08/04/2021 1710   APPEARANCEUR HAZY (A) 08/04/2021 1710   LABSPEC 1.014 08/04/2021 1710   PHURINE 5.0 08/04/2021 1710   GLUCOSEU NEGATIVE 08/04/2021 1710   HGBUR NEGATIVE 08/04/2021 1710   BILIRUBINUR NEGATIVE 08/04/2021 1710   KETONESUR NEGATIVE 08/04/2021 1710   PROTEINUR 30 (A) 08/04/2021 1710   UROBILINOGEN 0.2 12/06/2014 1531   NITRITE NEGATIVE 08/04/2021 1710   LEUKOCYTESUR MODERATE (A) 08/04/2021 1710   Recent Results (from the past 240 hour(s))  SARS Coronavirus 2 by RT PCR (hospital order, performed in [facility] hospital lab) *cepheid single result test* Anterior Nasal Swab     Status: None   Collection Time: 08/04/21  4:16 PM   Specimen: Anterior Nasal Swab  Result Value Ref Range Status   SARS Coronavirus 2 by RT PCR NEGATIVE NEGATIVE Final    Comment: (NOTE) SARS-CoV-2 target nucleic acids are NOT DETECTED.  The SARS-CoV-2 RNA is generally detectable in upper and lower respiratory specimens during the acute phase of infection. The lowest concentration of SARS-CoV-2 viral copies this  assay can detect is 250 copies / mL. A negative result does not preclude SARS-CoV-2 infection and should not be used as the sole basis for treatment or other patient management decisions.  A negative result may occur with improper specimen collection / handling, submission of specimen other than nasopharyngeal swab, presence of viral mutation(s) within the areas targeted by this assay, and inadequate number of viral copies (<250 copies / mL). A negative result must be combined with clinical observations, patient history, and epidemiological information.  Fact Sheet for Patients:   https://www.patel.info/  Fact Sheet for Healthcare Providers: https://hall.com/  This test is not yet approved or  cleared by the [place] FDA and has been authorized for detection and/or diagnosis  of SARS-CoV-2 by FDA under an Emergency Use Authorization (EUA).  This EUA will remain in effect (meaning this test can be used) for the duration of the COVID-19 declaration under Section 564(b)(1) of the Act, 21 U.S.C. section 360bbb-3(b)(1), unless the authorization is terminated or revoked sooner.  Performed at [facility], [place] [address], [place], [place] [address]      Family Communication: None at bedside  Disposition: Status is: Inpatient Remains inpatient appropriate because: Continue diuresis Planned Discharge Destination: Home with [place], MD 08/07/2021 3:54 PM Page by [email or web address]

## 2021-08-07 NOTE — Progress Notes (Signed)
,  Progress Note  Patient Name: [name] Date of Encounter: 08/07/2021  [facility] Cardiologist: Sanda Klein, MD   Subjective   Feeling well.  Breathing improved.  To struggle with diarrhea overnight.  Inpatient Medications    Scheduled Meds:  allopurinol  100 mg Oral q morning   aspirin EC  81 mg Oral q morning   clopidogrel  75 mg Oral QHS   cycloSPORINE  1 drop Both Eyes BID   furosemide  80 mg Intravenous Q12H   heparin injection (subcutaneous)  5,000 Units Subcutaneous Q8H   insulin aspart  0-6 Units Subcutaneous TID WC   mometasone-formoterol  2 puff Inhalation BID   rosuvastatin  20 mg Oral QPM   Continuous Infusions:  sodium chloride Stopped (08/05/21 1207)   ampicillin-sulbactam (UNASYN) IV 3 g (08/07/21 0947)   PRN Meds: sodium chloride, acetaminophen **OR** acetaminophen, albuterol, cetaphil, ondansetron (ZOFRAN) IV, mouth rinse, polyvinyl alcohol, sodium chloride   Vital Signs    Vitals:   08/06/21 1955 08/07/21 0500 08/07/21 0505 08/07/21 0800  BP: (!) 142/91  138/84   Pulse: 78  95   Resp: 18  18 (!) 21  Temp: 98.4 F (36.9 C)  98.2 F (36.8 C)   TempSrc: Oral  Oral   SpO2: 96%  100% 100%  Weight:  99.2 kg    Height:        Intake/Output Summary (Last 24 hours) at 08/07/2021 1101 Last data filed at 08/07/2021 1000 Gross per 24 hour  Intake 882 ml  Output 2450 ml  Net -1568 ml      08/07/2021    5:00 AM 08/06/2021    5:00 AM 08/05/2021    5:00 AM  Last 3 Weights  Weight (lbs) 218 lb 11.1 oz 222 lb 14.2 oz 222 lb 14.2 oz  Weight (kg) 99.2 kg 101.1 kg 101.1 kg      Telemetry    A sensed V paced- Personally Reviewed  ECG    N/a - Personally Reviewed  Physical Exam   VS:  BP 138/84 (BP Location: Left Arm)   Pulse 95   Temp 98.2 F (36.8 C) (Oral)   Resp (!) 21   Ht '5\' 3"'$  (1.6 m)   Wt 99.2 kg   SpO2 100%   BMI 38.74 kg/m  , BMI Body mass index is 38.74 kg/m. GENERAL:  Well appearing HEENT: Pupils equal round and  reactive, fundi not visualized, oral mucosa unremarkable NECK: Unable to assess JVD due to body habitus waveform within normal limits, carotid upstroke brisk and symmetric, no bruits, no thyromegaly LUNGS:  Clear to auscultation bilaterally HEART:  RRR.  PMI not displaced or sustained,S1 and S2 within normal limits, no S3, no S4, no clicks, no rubs, no murmurs ABD:  Flat, positive bowel sounds normal in frequency in pitch, no bruits, no rebound, no guarding, no midline pulsatile mass, no hepatomegaly, no splenomegaly EXT:  2 plus pulses throughout, 2+ lower extremity edema to the upper tibia bilaterally, no cyanosis no clubbing SKIN: Erythema of anterior tibia improved bilaterally NEURO:  Cranial nerves II through XII grossly intact, motor grossly intact throughout PSYCH:  Cognitively intact, oriented to person place and time  Labs    High Sensitivity Troponin:   Recent Labs  Lab 08/04/21 1551 08/04/21 1800  TROPONINIHS 30* 30*     Chemistry Recent Labs  Lab 08/04/21 1630 08/05/21 0747 08/06/21 0341 08/07/21 0358  NA  --  141 142 143  K  --  3.5 3.6 3.0*  CL  --  89* 90* 91*  CO2  --  40* 42* 40*  GLUCOSE  --  180* 117* 112*  BUN  --  45* 51* 43*  CREATININE  --  3.05* 3.28* 2.94*  CALCIUM  --  9.4 8.7* 9.0  PROT 6.1*  --   --   --   ALBUMIN 3.1*  --   --   --   AST 14*  --   --   --   ALT 11  --   --   --   ALKPHOS 88  --   --   --   BILITOT 0.6  --   --   --   GFRNONAA  --  15* 14* 16*  ANIONGAP  --  '12 10 12    '$ Lipids No results for input(s): "CHOL", "TRIG", "HDL", "LABVLDL", "LDLCALC", "CHOLHDL" in the last 168 hours.  Hematology Recent Labs  Lab 08/05/21 0747 08/06/21 0341 08/07/21 0358  WBC 4.7 5.0 5.1  RBC 3.41* 3.01* 3.08*  HGB 10.5* 9.4* 9.6*  HCT 36.1 31.7* 32.7*  MCV 105.9* 105.3* 106.2*  MCH 30.8 31.2 31.2  MCHC 29.1* 29.7* 29.4*  RDW 13.1 13.0 12.9  PLT 143* 149* 145*   Thyroid No results for input(s): "TSH", "FREET4" in the last 168 hours.   BNP Recent Labs  Lab 08/04/21 1430  BNP 321.7*    DDimer  Recent Labs  Lab 08/04/21 2013  DDIMER 2.34*     Radiology    ECHOCARDIOGRAM COMPLETE  Result Date: 08/05/2021    ECHOCARDIOGRAM REPORT   Patient Name:   [name] Date of Exam: 08/05/2021 Medical Rec #:  [id number]         Height:       63.0 in Accession #:    [id number]        Weight:       222.9 lb Date of Birth:  [birth year]          BSA:          2.025 m Patient Age:    74 years          BP:           122/66 mmHg Patient Gender: F                 HR:           97 bpm. Exam Location:  Inpatient Procedure: 2D Echo, Cardiac Doppler and Color Doppler Indications:    Dyspnea R06.00  History:        Patient has prior history of Echocardiogram examinations, most                 recent 09/17/2020. Cardiomyopathy and CHF, CAD, Pacemaker, Stroke,                 Arrythmias:Atrial Fibrillation, Signs/Symptoms:Dyspnea; Risk                 Factors:Hypertension, GERD, Diabetes and Sleep Apnea.  Sonographer:    Bernadene Person RDCS Referring Phys: [id number] [place]  Sonographer Comments: Patient is morbidly obese. IMPRESSIONS  1. Technically difficult study. Left ventricular ejection fraction, by estimation, is 45 to 50%. The left ventricle has mildly decreased function. The left ventricle demonstrates regional wall motion abnormalities (see scoring diagram/findings for description). Apical hypokinesis. There is mild left ventricular hypertrophy. Left ventricular diastolic parameters are indeterminate.  2. Right ventricular systolic function is normal. The right  ventricular size is normal. There is mildly elevated pulmonary artery systolic pressure. The estimated right ventricular systolic pressure is 08.6 mmHg.  3. The mitral valve is degenerative. No evidence of mitral valve regurgitation. Moderate mitral annular calcification.  4. The aortic valve was not well visualized. There is moderate calcification of the aortic valve. Aortic  valve regurgitation is trivial. Mild aortic valve stenosis. Vmax 2.5 m/s, MG 13 mmHg, AVA 2.0 cm^2, DI 0.48  5. The inferior vena cava is dilated in size with <50% respiratory variability, suggesting right atrial pressure of 15 mmHg. FINDINGS  Left Ventricle: Left ventricular ejection fraction, by estimation, is 45 to 50%. The left ventricle has mildly decreased function. The left ventricle demonstrates regional wall motion abnormalities. The left ventricular internal cavity size was normal in size. There is mild left ventricular hypertrophy. Left ventricular diastolic parameters are indeterminate.  LV Wall Scoring: The apical lateral segment, apical inferior segment, and apex are hypokinetic. The entire anterior wall, antero-lateral wall, entire septum, inferior wall, and posterior wall are normal. Right Ventricle: The right ventricular size is normal. Right vetricular wall thickness was not well visualized. Right ventricular systolic function is normal. There is mildly elevated pulmonary artery systolic pressure. The tricuspid regurgitant velocity  is 2.59 m/s, and with an assumed right atrial pressure of 15 mmHg, the estimated right ventricular systolic pressure is 76.1 mmHg. Left Atrium: Left atrial size was normal in size. Right Atrium: Right atrial size was normal in size. Pericardium: There is no evidence of pericardial effusion. Mitral Valve: The mitral valve is degenerative in appearance. Moderate mitral annular calcification. No evidence of mitral valve regurgitation. Tricuspid Valve: The tricuspid valve is normal in structure. Tricuspid valve regurgitation is trivial. Aortic Valve: The aortic valve was not well visualized. There is moderate calcification of the aortic valve. Aortic valve regurgitation is trivial. Mild aortic stenosis is present. Aortic valve mean gradient measures 11.7 mmHg. Aortic valve peak gradient  measures 22.9 mmHg. Aortic valve area, by VTI measures 2.03 cm. Pulmonic Valve: The  pulmonic valve was not well visualized. Pulmonic valve regurgitation is trivial. Aorta: The aortic root is normal in size and structure. Venous: The inferior vena cava is dilated in size with less than 50% respiratory variability, suggesting right atrial pressure of 15 mmHg. IAS/Shunts: The interatrial septum was not well visualized.  LEFT VENTRICLE PLAX 2D LVIDd:         4.80 cm   Diastology LVIDs:         3.00 cm   LV e' medial:    4.56 cm/s LV PW:         1.20 cm   LV E/e' medial:  21.8 LV IVS:        1.10 cm   LV e' lateral:   6.75 cm/s LVOT diam:     2.30 cm   LV E/e' lateral: 14.7 LV SV:         96 LV SV Index:   47 LVOT Area:     4.15 cm  RIGHT VENTRICLE RV S prime:     5.68 cm/s TAPSE (M-mode): 1.5 cm LEFT ATRIUM             Index        RIGHT ATRIUM           Index LA diam:        3.70 cm 1.83 cm/m   RA Area:     13.00 cm LA Vol (A2C):   44.9 ml 22.18 ml/m  RA Volume:   26.40 ml  13.04 ml/m LA Vol (A4C):   75.0 ml 37.04 ml/m LA Biplane Vol: 64.2 ml 31.71 ml/m  AORTIC VALVE AV Area (Vmax):    1.68 cm AV Area (Vmean):   1.88 cm AV Area (VTI):     2.03 cm AV Vmax:           239.33 cm/s AV Vmean:          158.333 cm/s AV VTI:            0.473 m AV Peak Grad:      22.9 mmHg AV Mean Grad:      11.7 mmHg LVOT Vmax:         97.00 cm/s LVOT Vmean:        71.800 cm/s LVOT VTI:          0.231 m LVOT/AV VTI ratio: 0.49  AORTA Ao Root diam: 3.70 cm Ao Asc diam:  3.70 cm MITRAL VALVE                TRICUSPID VALVE MV Area (PHT): 4.06 cm     TR Peak grad:   26.8 mmHg MV Decel Time: 187 msec     TR Vmax:        259.00 cm/s MV E velocity: 99.20 cm/s MV A velocity: 145.00 cm/s  SHUNTS MV E/A ratio:  0.68         Systemic VTI:  0.23 m                             Systemic Diam: 2.30 cm Oswaldo Milian MD Electronically signed by Oswaldo Milian MD Signature Date/Time: 08/05/2021/2:26:03 PM    Final     Cardiac Studies   ECHO: 08/05/2021  1. Technically difficult study. Left ventricular ejection  fraction, by  estimation, is 45 to 50%. The left ventricle has mildly decreased  function. The left ventricle demonstrates regional wall motion  abnormalities (see scoring diagram/findings for description). Apical hypokinesis. There is mild left ventricular hypertrophy. Left ventricular diastolic parameters are indeterminate.   2. Right ventricular systolic function is normal. The right ventricular  size is normal. There is mildly elevated pulmonary artery systolic  pressure. The estimated right ventricular systolic pressure is 06.3 mmHg.   3. The mitral valve is degenerative. No evidence of mitral valve  regurgitation. Moderate mitral annular calcification.   4. The aortic valve was not well visualized. There is moderate  calcification of the aortic valve. Aortic valve regurgitation is trivial.  Mild aortic valve stenosis. Vmax 2.5 m/s, MG 13 mmHg, AVA 2.0 cm^2, DI 0.48   5. The inferior vena cava is dilated in size with <50% respiratory  variability, suggesting right atrial pressure of 15 mmHg.    CARDIAC CATH 03/11/2019        Ost 1st Diag lesion is 60% stenosed. Ost 2nd Diag lesion is 50% stenosed. Mid LAD lesion is 100% stenosed. Non-stenotic Ost Cx to Mid Cx lesion was previously treated. Prox RCA lesion is 100% stenosed. Non-stenotic Ost Ramus to Ramus lesion was previously treated. Origin to Prox Graft lesion before Ramus is 100% stenosed.   Findings:   Ao = 136/68 (95)  LV =  155/11 RA =  7 RV = 34/9 PA = 34/17 (24) PCW = 14 Fick cardiac output/index = 6.8/3.1 PVR = 1.5 WU FA sat = 96% PA sat = 65%, 75%, 69%   Assessment: 1. Stable  coronary anatomy with no change from previous. SVG to OM system chronically occluded with previous PCI of native LCX system. Remainder of grafts are widely patent 2. Very mild PAH with normal left-sided pressures. 3. Very difficult procedure given patient's size and fidgeting on cath table Diagnostic Dominance: Right    Patient Profile      Ms. Amberg is a 44F with CAD s/p CABG, chronic systolic and diastolic heart failure, morbid obesity, untreated OSA, DM, CHB s/p CRT-P, PAF, adrenal insufficiency, hypertension, CKD 4 admitted with acute on chronic diastolic heart failure and cellulitis.  Assessment & Plan    #Acute on chronic systolic diastolic heart failure: #Status post CRT-P: Systolic function has been as low as 20% and improved to 55-60% with guideline directed medical therapy and biventricular pacing.  She has had her generator changed out 12/2020.  She is followed in the advanced heart failure clinic and was last seen 01/2021.  She now presents with acute on chronic systolic and diastolic heart failure.  LVEF is now 45 to 50% with apical hypokinesis.  PASP is 41 mmHg.  She has mild aortic stenosis with a mean gradient of 13 mmHg.  Right atrial pressure is 15 mmHg.  She is not having any ischemic symptoms and she has acute on chronic renal failure.  No plan for any repeat ischemic evaluation at this time.  Creatinine is improving with diuresis.  She was net -2.2 L yesterday and her creatinine is decreased from 3.2-2.9.  Continue with current rate of diuresis.  Blood pressure stable.  She is not on guideline directed medical therapy due to chronic kidney disease.  We will get her device interrogated to make sure that she has a high pacing percentage.  #CAD status post CABG and PCI: #Elevated troponin: #Hyperlipidemia: Currently no ischemic symptoms.  LVEF is reduced from her baseline, though better than it has been in the past as above.  No plan for repeat ischemic evaluation.  Continue aspirin, clopidogrel, and rosuvastatin.  High-sensitivity troponin was mildly elevated but flat in the low 30s.  More consistent with demand ischemia.  We will add on fasting lipids to a.m. labs to make sure she is at goal.  #PAF: Currently atrial paced.  We will interrogate device as above.  Per prior heart failure clinic notes, her A-fib  burden has been low and therefore she was not started on anticoagulation, especially in the setting of dual antiplatelet therapy.  # OSA/OHS:  Patient unable to tolerate BiPAP or CPAP.  Continue supplemental oxygen.  # Hypertension/hypotension:  Patient is on midodrine at home.  Blood pressures here have been stable without midodrine.  She also takes Imdur.  We will resume her home Imdur and add midodrine if needed.    For questions or updates, please contact [facility] Please consult [email or web address] for contact info under        Signed, Skeet Latch, MD  08/07/2021, 11:01 AM

## 2021-08-07 NOTE — Care Management Important Message (Signed)
Important Message  Patient Details IM Letter given to the Patient. Name: [name] MRN: [id number] Date of Birth: 21-Oct-1945   Medicare Important Message Given:  Yes     Kerin Salen 08/07/2021, 11:49 AM

## 2021-08-08 DIAGNOSIS — I2510 Atherosclerotic heart disease of native coronary artery without angina pectoris: Secondary | ICD-10-CM | POA: Diagnosis not present

## 2021-08-08 DIAGNOSIS — Z951 Presence of aortocoronary bypass graft: Secondary | ICD-10-CM | POA: Diagnosis not present

## 2021-08-08 DIAGNOSIS — G4733 Obstructive sleep apnea (adult) (pediatric): Secondary | ICD-10-CM | POA: Diagnosis not present

## 2021-08-08 DIAGNOSIS — M10271 Drug-induced gout, right ankle and foot: Secondary | ICD-10-CM

## 2021-08-08 DIAGNOSIS — I5041 Acute combined systolic (congestive) and diastolic (congestive) heart failure: Secondary | ICD-10-CM | POA: Diagnosis not present

## 2021-08-08 LAB — LIPID PANEL
Cholesterol: 189 mg/dL (ref 0–200)
HDL: 51 mg/dL (ref >40)
LDL Cholesterol: 119 mg/dL — ABNORMAL HIGH (ref 0–99)
Total CHOL/HDL Ratio: 3.7 RATIO
Triglycerides: 97 mg/dL (ref <150)
VLDL: 19 mg/dL (ref 0–40)

## 2021-08-08 LAB — BASIC METABOLIC PANEL
Anion gap: 9 (ref 5–15)
BUN: 44 mg/dL — ABNORMAL HIGH (ref 8–23)
CO2: 44 mmol/L — ABNORMAL HIGH (ref 22–32)
Calcium: 9.2 mg/dL (ref 8.9–10.3)
Chloride: 90 mmol/L — ABNORMAL LOW (ref 98–111)
Creatinine, Ser: 2.92 mg/dL — ABNORMAL HIGH (ref 0.44–1.00)
GFR, Estimated: 16 mL/min — ABNORMAL LOW (ref >60)
Glucose, Bld: 119 mg/dL — ABNORMAL HIGH (ref 70–99)
Potassium: 3.2 mmol/L — ABNORMAL LOW (ref 3.5–5.1)
Sodium: 143 mmol/L (ref 135–145)

## 2021-08-08 LAB — CBC
HCT: 30.7 % — ABNORMAL LOW (ref 36.0–46.0)
Hemoglobin: 9 g/dL — ABNORMAL LOW (ref 12.0–15.0)
MCH: 31.4 pg (ref 26.0–34.0)
MCHC: 29.3 g/dL — ABNORMAL LOW (ref 30.0–36.0)
MCV: 107 fL — ABNORMAL HIGH (ref 80.0–100.0)
Platelets: 135 10*3/uL — ABNORMAL LOW (ref 150–400)
RBC: 2.87 MIL/uL — ABNORMAL LOW (ref 3.87–5.11)
RDW: 12.7 % (ref 11.5–15.5)
WBC: 5.6 10*3/uL (ref 4.0–10.5)
nRBC: 0 % (ref 0.0–0.2)

## 2021-08-08 LAB — RETICULOCYTES
Immature Retic Fract: 17.3 % — ABNORMAL HIGH (ref 2.3–15.9)
RBC.: 2.87 MIL/uL — ABNORMAL LOW (ref 3.87–5.11)
Retic Count, Absolute: 72 10*3/uL (ref 19.0–186.0)
Retic Ct Pct: 2.5 % (ref 0.4–3.1)

## 2021-08-08 LAB — IRON AND TIBC
Iron: 34 ug/dL (ref 28–170)
Saturation Ratios: 15 % (ref 10.4–31.8)
TIBC: 225 ug/dL — ABNORMAL LOW (ref 250–450)
UIBC: 191 ug/dL

## 2021-08-08 LAB — GLUCOSE, CAPILLARY
Glucose-Capillary: 118 mg/dL — ABNORMAL HIGH (ref 70–99)
Glucose-Capillary: 95 mg/dL (ref 70–99)
Glucose-Capillary: 180 mg/dL — ABNORMAL HIGH (ref 70–99)
Glucose-Capillary: 282 mg/dL — ABNORMAL HIGH (ref 70–99)

## 2021-08-08 LAB — FOLATE: Folate: 7.6 ng/mL (ref >5.9)

## 2021-08-08 LAB — FERRITIN: Ferritin: 376 ng/mL — ABNORMAL HIGH (ref 11–307)

## 2021-08-08 LAB — VITAMIN B12: Vitamin B-12: 109 pg/mL — ABNORMAL LOW (ref 180–914)

## 2021-08-08 MED ORDER — POTASSIUM CHLORIDE CRYS ER 20 MEQ PO TBCR
40.0000 meq | EXTENDED_RELEASE_TABLET | Freq: Once | ORAL | Status: AC
  Administered 2021-08-08: 40 meq via ORAL
  Filled 2021-08-08: qty 2

## 2021-08-08 MED ORDER — METHYLPREDNISOLONE SODIUM SUCC 40 MG IJ SOLR
40.0000 mg | INTRAMUSCULAR | Status: DC
  Administered 2021-08-08 – 2021-08-12 (×5): 40 mg via INTRAVENOUS
  Filled 2021-08-08 (×5): qty 1

## 2021-08-08 MED ORDER — ROSUVASTATIN CALCIUM 20 MG PO TABS
40.0000 mg | ORAL_TABLET | Freq: Every evening | ORAL | Status: DC
  Administered 2021-08-08 – 2021-08-15 (×8): 40 mg via ORAL
  Filled 2021-08-08 (×8): qty 2

## 2021-08-08 MED ORDER — FOLIC ACID 1 MG PO TABS
1.0000 mg | ORAL_TABLET | Freq: Every day | ORAL | Status: DC
  Administered 2021-08-08 – 2021-08-15 (×8): 1 mg via ORAL
  Filled 2021-08-08 (×8): qty 1

## 2021-08-08 MED ORDER — FUROSEMIDE 40 MG PO TABS
80.0000 mg | ORAL_TABLET | Freq: Two times a day (BID) | ORAL | Status: DC
  Administered 2021-08-09 – 2021-08-11 (×4): 80 mg via ORAL
  Filled 2021-08-08 (×4): qty 2

## 2021-08-08 MED ORDER — CYANOCOBALAMIN 1000 MCG/ML IJ SOLN
1000.0000 ug | Freq: Every day | INTRAMUSCULAR | Status: AC
Start: 2021-08-08 — End: 2021-08-10
  Administered 2021-08-08 – 2021-08-10 (×3): 1000 ug via INTRAMUSCULAR
  Filled 2021-08-08 (×3): qty 1

## 2021-08-08 NOTE — Progress Notes (Signed)
,  Progress Note  Patient Name: [name] Date of Encounter: 08/08/2021  [facility] Cardiologist: Sanda Klein, MD   Subjective   Feeling well.  Breathing improved. Eager to go home.   Inpatient Medications    Scheduled Meds:  allopurinol  100 mg Oral q morning   aspirin EC  81 mg Oral q morning   clopidogrel  75 mg Oral QHS   cyanocobalamin  1,000 mcg Intramuscular Daily   cycloSPORINE  1 drop Both Eyes BID   folic acid  1 mg Oral Daily   furosemide  80 mg Intravenous Q12H   heparin injection (subcutaneous)  5,000 Units Subcutaneous Q8H   insulin aspart  0-6 Units Subcutaneous TID WC   isosorbide mononitrate  90 mg Oral Daily   mometasone-formoterol  2 puff Inhalation BID   potassium chloride  40 mEq Oral Once   rosuvastatin  20 mg Oral QPM   Continuous Infusions:  sodium chloride Stopped (08/05/21 1207)   ampicillin-sulbactam (UNASYN) IV 3 g (08/07/21 2147)   PRN Meds: sodium chloride, acetaminophen **OR** acetaminophen, albuterol, cetaphil, ondansetron (ZOFRAN) IV, mouth rinse, polyvinyl alcohol, sodium chloride   Vital Signs    Vitals:   08/08/21 0452 08/08/21 0453 08/08/21 0845 08/08/21 0846  BP: (!) 125/7     Pulse:      Resp: (!) 22     Temp: 98.5 F (36.9 C)     TempSrc: Oral     SpO2: 95%  94% 94%  Weight:  99.7 kg    Height:        Intake/Output Summary (Last 24 hours) at 08/08/2021 0945 Last data filed at 08/08/2021 0600 Gross per 24 hour  Intake 1160 ml  Output 2000 ml  Net -840 ml      08/08/2021    4:53 AM 08/07/2021    5:00 AM 08/06/2021    5:00 AM  Last 3 Weights  Weight (lbs) 219 lb 12.8 oz 218 lb 11.1 oz 222 lb 14.2 oz  Weight (kg) 99.7 kg 99.2 kg 101.1 kg      Telemetry    A sensed V paced- Personally Reviewed  ECG    N/a - Personally Reviewed  Physical Exam   VS:  BP (!) 125/7 (BP Location: Left Arm)   Pulse 100   Temp 98.5 F (36.9 C) (Oral)   Resp (!) 22   Ht '5\' 3"'$  (1.6 m)   Wt 99.7 kg   SpO2 94%    BMI 38.94 kg/m  , BMI Body mass index is 38.94 kg/m. GENERAL:  Well appearing HEENT: Pupils equal round and reactive, fundi not visualized, oral mucosa unremarkable NECK: Unable to assess JVD due to body habitus waveform within normal limits, carotid upstroke brisk and symmetric, no bruits, no thyromegaly LUNGS:  Clear to auscultation bilaterally HEART:  RRR.  PMI not displaced or sustained,S1 and S2 within normal limits, no S3, no S4, no clicks, no rubs, no murmurs ABD:  Flat, positive bowel sounds normal in frequency in pitch, no bruits, no rebound, no guarding, no midline pulsatile mass, no hepatomegaly, no splenomegaly EXT:  2 plus pulses throughout, trace lower extremity edema to the upper tibia bilaterally, no cyanosis no clubbing SKIN: Erythema of anterior tibia improved bilaterally NEURO:  Cranial nerves II through XII grossly intact, motor grossly intact throughout PSYCH:  Cognitively intact, oriented to person place and time  Labs    High Sensitivity Troponin:   Recent Labs  Lab 08/04/21 1551 08/04/21 1800  TROPONINIHS 30* 30*     Chemistry Recent Labs  Lab 08/04/21 1630 08/05/21 0747 08/06/21 0341 08/07/21 0358 08/08/21 0412  NA  --    < > 142 143 143  K  --    < > 3.6 3.0* 3.2*  CL  --    < > 90* 91* 90*  CO2  --    < > 42* 40* 44*  GLUCOSE  --    < > 117* 112* 119*  BUN  --    < > 51* 43* 44*  CREATININE  --    < > 3.28* 2.94* 2.92*  CALCIUM  --    < > 8.7* 9.0 9.2  PROT 6.1*  --   --   --   --   ALBUMIN 3.1*  --   --   --   --   AST 14*  --   --   --   --   ALT 11  --   --   --   --   ALKPHOS 88  --   --   --   --   BILITOT 0.6  --   --   --   --   GFRNONAA  --    < > 14* 16* 16*  ANIONGAP  --    < > '10 12 9   '$ < > = values in this interval not displayed.    Lipids  Recent Labs  Lab 08/08/21 0412  CHOL 189  TRIG 97  HDL 51  LDLCALC 119*  CHOLHDL 3.7    Hematology Recent Labs  Lab 08/06/21 0341 08/07/21 0358 08/08/21 0412  WBC 5.0 5.1 5.6   RBC 3.01* 3.08* 2.87*  2.87*  HGB 9.4* 9.6* 9.0*  HCT 31.7* 32.7* 30.7*  MCV 105.3* 106.2* 107.0*  MCH 31.2 31.2 31.4  MCHC 29.7* 29.4* 29.3*  RDW 13.0 12.9 12.7  PLT 149* 145* 135*   Thyroid No results for input(s): "TSH", "FREET4" in the last 168 hours.  BNP Recent Labs  Lab 08/04/21 1430  BNP 321.7*    DDimer  Recent Labs  Lab 08/04/21 2013  DDIMER 2.34*     Radiology    No results found.  Cardiac Studies   ECHO: 08/05/2021  1. Technically difficult study. Left ventricular ejection fraction, by  estimation, is 45 to 50%. The left ventricle has mildly decreased  function. The left ventricle demonstrates regional wall motion  abnormalities (see scoring diagram/findings for description). Apical hypokinesis. There is mild left ventricular hypertrophy. Left ventricular diastolic parameters are indeterminate.   2. Right ventricular systolic function is normal. The right ventricular  size is normal. There is mildly elevated pulmonary artery systolic  pressure. The estimated right ventricular systolic pressure is 46.9 mmHg.   3. The mitral valve is degenerative. No evidence of mitral valve  regurgitation. Moderate mitral annular calcification.   4. The aortic valve was not well visualized. There is moderate  calcification of the aortic valve. Aortic valve regurgitation is trivial.  Mild aortic valve stenosis. Vmax 2.5 m/s, MG 13 mmHg, AVA 2.0 cm^2, DI 0.48   5. The inferior vena cava is dilated in size with <50% respiratory  variability, suggesting right atrial pressure of 15 mmHg.    CARDIAC CATH 03/11/2019        Ost 1st Diag lesion is 60% stenosed. Ost 2nd Diag lesion is 50% stenosed. Mid LAD lesion is 100% stenosed. Non-stenotic Ost Cx to Mid Cx lesion was previously treated. Prox  RCA lesion is 100% stenosed. Non-stenotic Ost Ramus to Ramus lesion was previously treated. Origin to Prox Graft lesion before Ramus is 100% stenosed.   Findings:   Ao = 136/68 (95)   LV =  155/11 RA =  7 RV = 34/9 PA = 34/17 (24) PCW = 14 Fick cardiac output/index = 6.8/3.1 PVR = 1.5 WU FA sat = 96% PA sat = 65%, 75%, 69%   Assessment: 1. Stable coronary anatomy with no change from previous. SVG to OM system chronically occluded with previous PCI of native LCX system. Remainder of grafts are widely patent 2. Very mild PAH with normal left-sided pressures. 3. Very difficult procedure given patient's size and fidgeting on cath table Diagnostic Dominance: Right    Patient Profile     Ms. Chason is a 73F with CAD s/p CABG, chronic systolic and diastolic heart failure, morbid obesity, untreated OSA, DM, CHB s/p CRT-P, PAF, adrenal insufficiency, hypertension, CKD 4 admitted with acute on chronic diastolic heart failure and cellulitis.  Assessment & Plan    #Acute on chronic systolic diastolic heart failure: #Status post CRT-P: Systolic function has been as low as 20% and improved to 55-60% with guideline directed medical therapy and biventricular pacing.  She has had her generator changed out 12/2020.  She is followed in the advanced heart failure clinic and was last seen 01/2021.  She now presents with acute on chronic systolic and diastolic heart failure.  LVEF is now 45 to 50% with apical hypokinesis.  PASP is 41 mmHg.  She has mild aortic stenosis with a mean gradient of 13 mmHg.  Right atrial pressure is 15 mmHg.  She is not having any ischemic symptoms and she has acute on chronic renal failure.  No plan for any repeat ischemic evaluation at this time.  Creatinine improved with diuresis.  She feels that she is back to her baseline.  Will transition Lasix back to 80 mg p.o. twice daily.  She is not on guideline directed medical therapy due to chronic kidney disease.  Pacing 95% on interrogation this admission.   #CAD status post CABG and PCI: #Elevated troponin: #Hyperlipidemia: Currently no ischemic symptoms.  LVEF is reduced from her baseline, though better  than it has been in the past as above.  No plan for repeat ischemic evaluation.  Continue aspirin, clopidogrel, and rosuvastatin.  High-sensitivity troponin was mildly elevated but flat in the low 30s.  More consistent with demand ischemia.  LDL is not at goal.  Increase rosuvastatin to '40mg'$ .  Repeat lipids/CMP in 2-3 months.   #PAF: Currently atrial paced.  She had 95% pacing.  Also some atrial tachycardia around 115 bpm on device interrogation.  We will interrogate device as above.  Per prior heart failure clinic notes, her A-fib burden has been low and therefore she was not started on anticoagulation, especially in the setting of dual antiplatelet therapy.  # OSA/OHS:  Patient unable to tolerate BiPAP or CPAP.  Continue supplemental oxygen.  # Hypertension/hypotension:  Patient is on midodrine at home.  Blood pressures here have been stable without midodrine.  She also takes Imdur.  We will resume her home Imdur and add midodrine.  BP stable after resuming Imdur.  For questions or updates, please contact [place] Please consult [email or web address] for contact info under        Signed, Skeet Latch, MD  08/08/2021, 9:45 AM

## 2021-08-08 NOTE — Progress Notes (Signed)
Progress Note  Patient: [name] DOB: [birth year]  DOA: 08/04/2021  DOS: 08/08/2021    Brief hospital course: [name] is a 76 y.o. female with a history of chronic diastolic CHF, CAD s/p CABG, CHB s/p pacemaker who was recently on dialysis for ATN and has been off dialysis and dialysis catheter removed about 3 weeks ago, recently treated for lower extremity cellulitis who presented to the ED 6/25 with worsening dyspnea, lower extremity edema and erythema. BNP was elevated, CXR nonspecific left base opacity favored to represent atelectasis. LVEF 45-50% by echo. IV lasix was started and the patient was admitted with cardiology consultation for acute CHF. D-dimer was positive, though suspicion for PE is low. On 6/29, respiratory status has improved and diuresis is transitioned to oral lasix per cardiology. She has developed acute gout in the right foot, however, which severely limits her mobility for which steroids are started.  Assessment and Plan: Acute on chronic hypoxic respiratory failure due to acute on chronic combined HFrEF: Pt is s/p CRT-P with improvement in LVEF with pacing and GDMT, now 45-50% - Continue lasix, transition '80mg'$  IV > PO BID today per cardiology., monitoring I/O 1.5L/24hrs), daily weights (down 101.1 > 99.2kg which is likely EDW).  - Weaned back to home 2L O2    Acute gout: Primarily affecting right 1st MTP, though diffuse pain is noted in bilateral R > L feet/ankles. Developing this morning. Not NSAID candidate, has allergies to febuxostat and colchicine.  - Start IV steroid now.  - Note she was ambulating 14f w/PT previously, but unable to ambulate today due to pain. Would not currently be a safe discharge to home.  Stage IV CKD: Recently on HD due to ATN developing postoperatively after hip fracture and replacement, with subsequent renal recovery.  - Monitor BMP, Cr is stable. - Avoid nephrotoxins, continue routine nephrology follow up.    Bilateral lower leg cellulitis: Treated as such since arrival with reported improvement. Tx with unasyn is limited by medication intolerances/allergies.  - With improvement, and diarrhea likely associated with abx, will plan to limit Tx to 5 days (last dose 6/30 AM). If needing to continue, would change to alternative agent to avoid the salt load and preferably narrow scope.    CAD s/p CABG, HLD with demand myocardial ischemia: Troponin trend is flat without anginal complaint.  - No inpatient work up indicated - Continue home DAPT, high-intensity statin (augment to rosuvastatin '40mg'$  daily), imdur - Check lipid panel in next 2-3 months, LDL is 119.   PAF:  - 95% pacing on interrogation per cardiology. Rate largely controlled. - Not on anticoagulation due to DAPT and low burden of AFib.  HTN, hypotension:   - Stable on home imdur, does not currently appear to require midodrine.    NIDT2DM: HbA1c 6.3%.  - Continue SSI. Hold home medications while admitted.   OSA: Nonadherent to CPAP at home - Encouraged CPAP qHS.    Elevated d-dimer: LE venous doppler negative for DVT. Clinical scenario not strongly suggestive of PE, so will stop empiric heparin as she is at risk of bleeding and declines further PE evaluation at this time.   Anemia of chronic kidney disease and B12 deficiency: No bleeding noted. Iron stores sufficient. - Supplement B12 IM while admitted, then PO at discharge - Supplement folic acid which is borderline.  - Monitor Hgb intermittently.  Thrombocytopenia:  - Monitor again in AM.   Obesity: Estimated body mass index is 38.94 kg/m as calculated  from the following:   Height as of this encounter: '5\' 3"'$  (1.6 m).   Weight as of this encounter: 99.7 kg.  Subjective: Stools normalized, no fevers, swelling and redness in legs is much improved, dyspnea is resolved/returned to baseline. She felt well this morning but experienced significant pain in R > L feet when trying to get  up with PT. Does have hx gout and this is reminiscent of prior flares. She was only able to pivot transfer to chair.  Objective: Vitals:   08/08/21 0845 08/08/21 0846 08/08/21 1002 08/08/21 1322  BP:   120/63 (!) 108/56  Pulse:   85 93  Resp:    20  Temp:    97.8 F (36.6 C)  TempSrc:    Oral  SpO2: 94% 94% 100% 98%  Weight:      Height:       Gen: 76 y.o. female in no distress Pulm: Nonlabored breathing 2L O2. Clear. CV: Regular, no murmur, rub, or gallop. No definite JVD, trace-to-1+ pitting lower leg dependent edema. GI: Abdomen soft, non-tender, non-distended, with normoactive bowel sounds.  Ext: Warm, no deformities. On reevalation this afternoon, right great toe is tender to palpation with slight erythema with no other visible or palpable deformities in ankle or foot, though R > L ankle and forefoot are tender to palpation. Mild calluses in hind and forefoot bilaterally without ulceration. Skin: No new rashes, lesions or ulcers on visualized skin. Neuro: Alert and oriented. No focal neurological deficits. Psych: Judgement and insight appear fair. Mood euthymic & affect congruent. Behavior is appropriate.    Data Personally reviewed: CBC: Recent Labs  Lab 08/04/21 1616 08/05/21 0747 08/06/21 0341 08/07/21 0358 08/08/21 0412  WBC 6.7 4.7 5.0 5.1 5.6  NEUTROABS 4.7  --   --   --   --   HGB 10.2* 10.5* 9.4* 9.6* 9.0*  HCT 34.8* 36.1 31.7* 32.7* 30.7*  MCV 106.1* 105.9* 105.3* 106.2* 107.0*  PLT 167 143* 149* 145* 888*   Basic Metabolic Panel: Recent Labs  Lab 08/04/21 1551 08/05/21 0747 08/06/21 0341 08/07/21 0358 08/08/21 0412  NA 140 141 142 143 143  K 3.3* 3.5 3.6 3.0* 3.2*  CL 88* 89* 90* 91* 90*  CO2 40* 40* 42* 40* 44*  GLUCOSE 129* 180* 117* 112* 119*  BUN 45* 45* 51* 43* 44*  CREATININE 3.38* 3.05* 3.28* 2.94* 2.92*  CALCIUM 9.4 9.4 8.7* 9.0 9.2   GFR: Estimated Creatinine Clearance: 18.2 mL/min (A) (by C-G formula based on SCr of 2.92 mg/dL  (H)). Liver Function Tests: Recent Labs  Lab 08/04/21 1630  AST 14*  ALT 11  ALKPHOS 88  BILITOT 0.6  PROT 6.1*  ALBUMIN 3.1*   No results for input(s): "LIPASE", "AMYLASE" in the last 168 hours. No results for input(s): "AMMONIA" in the last 168 hours. Coagulation Profile: No results for input(s): "INR", "PROTIME" in the last 168 hours. Cardiac Enzymes: No results for input(s): "CKTOTAL", "CKMB", "CKMBINDEX", "TROPONINI" in the last 168 hours. BNP (last 3 results) No results for input(s): "PROBNP" in the last 8760 hours. HbA1C: No results for input(s): "HGBA1C" in the last 72 hours. CBG: Recent Labs  Lab 08/07/21 1635 08/07/21 1751 08/07/21 2046 08/08/21 0743 08/08/21 1147  GLUCAP 221* 170* 135* 118* 95   Lipid Profile: Recent Labs    08/08/21 0412  CHOL 189  HDL 51  LDLCALC 119*  TRIG 97  CHOLHDL 3.7   Thyroid Function Tests: No results for input(s): "TSH", "T4TOTAL", "FREET4", "  T3FREE", "THYROIDAB" in the last 72 hours. Anemia Panel: Recent Labs    08/08/21 0412  VITAMINB12 109*  FOLATE 7.6  FERRITIN 376*  TIBC 225*  IRON 34  RETICCTPCT 2.5   Urine analysis:    Component Value Date/Time   COLORURINE YELLOW 08/04/2021 1710   APPEARANCEUR HAZY (A) 08/04/2021 1710   LABSPEC 1.014 08/04/2021 1710   PHURINE 5.0 08/04/2021 1710   GLUCOSEU NEGATIVE 08/04/2021 1710   HGBUR NEGATIVE 08/04/2021 1710   BILIRUBINUR NEGATIVE 08/04/2021 1710   KETONESUR NEGATIVE 08/04/2021 1710   PROTEINUR 30 (A) 08/04/2021 1710   UROBILINOGEN 0.2 12/06/2014 1531   NITRITE NEGATIVE 08/04/2021 1710   LEUKOCYTESUR MODERATE (A) 08/04/2021 1710   Recent Results (from the past 240 hour(s))  SARS Coronavirus 2 by RT PCR (hospital order, performed in [place] hospital lab) *cepheid single result test* Anterior Nasal Swab     Status: None   Collection Time: 08/04/21  4:16 PM   Specimen: Anterior Nasal Swab  Result Value Ref Range Status   SARS Coronavirus 2 by RT PCR  NEGATIVE NEGATIVE Final    Comment: (NOTE) SARS-CoV-2 target nucleic acids are NOT DETECTED.  The SARS-CoV-2 RNA is generally detectable in upper and lower respiratory specimens during the acute phase of infection. The lowest concentration of SARS-CoV-2 viral copies this assay can detect is 250 copies / mL. A negative result does not preclude SARS-CoV-2 infection and should not be used as the sole basis for treatment or other patient management decisions.  A negative result may occur with improper specimen collection / handling, submission of specimen other than nasopharyngeal swab, presence of viral mutation(s) within the areas targeted by this assay, and inadequate number of viral copies (<250 copies / mL). A negative result must be combined with clinical observations, patient history, and epidemiological information.  Fact Sheet for Patients:   https://www.patel.info/  Fact Sheet for Healthcare Providers: https://hall.com/  This test is not yet approved or  cleared by the [place] FDA and has been authorized for detection and/or diagnosis of SARS-CoV-2 by FDA under an Emergency Use Authorization (EUA).  This EUA will remain in effect (meaning this test can be used) for the duration of the COVID-19 declaration under Section 564(b)(1) of the Act, 21 U.S.C. section 360bbb-3(b)(1), unless the authorization is terminated or revoked sooner.  Performed at [facility], [place] [address], [place], [place] [address]      Family Communication: None at bedside  Disposition: Status is: Inpatient Remains inpatient appropriate because: Continue diuresis, start gout Tx. If stable, can DC 6/30.  Planned Discharge Destination: Home with Home Health if mobility improves with gout Tx.    Patrecia Pour, MD 08/08/2021 1:46 PM Page by [email or web address]

## 2021-08-08 NOTE — Care Management Important Message (Signed)
Important Message  Patient Details  Name: [name] MRN: [id number] Date of Birth: 12/12/1945   Medicare Important Message Given:  Yes     Memory Argue 08/08/2021, 1:58 PM

## 2021-08-08 NOTE — Progress Notes (Signed)
Physical Therapy Treatment Patient Details Name: [name] MRN: [id number] DOB: Jul 12, 1945 Today's Date: 08/08/2021   History of Present Illness 76 year old female presenting to ED experiencing increasing shortness of breath for the last 3 days with pleuritic chest pain no fever chills or productive cough. In the ER patient has significant edema of the lower extremity and also erythema of both lower extremities. Chest x-ray shows nonspecific finding for concerning for opacity: "IMPRESSION: Increased opacity in the left base may represent atelectasis or developing infiltrate/pneumonia".  D-dimer was elevated.  Venous dopplars negative for evidence of DVTs. Medical history of chronic diastolic CHF, CAD status post CABG, complete heart block status post pacemaker placement was recently on dialysis and has been off dialysis and dialysis catheter removed about 3 weeks ago recently treated for lower extremity cellulitis. Pt seen on 04/03/21 after a fall with LT hip and pelvis fractures.    PT Comments    Pt attempted sit to stand x 2 with RW, she was unable to ambulate due to bilateral (right worse than left) foot pain, she reports this pain is new as of this morning. Pt performed seated BUE/LE strengthening exercises. RN notified of new onset of pain, and of pt request for pain medication.     Recommendations for follow up therapy are one component of a multi-disciplinary discharge planning process, led by the attending physician.  Recommendations may be updated based on patient status, additional functional criteria and insurance authorization.  Follow Up Recommendations  Home health PT     Assistance Recommended at Discharge    Patient can return home with the following A little help with walking and/or transfers;A little help with bathing/dressing/bathroom;Assistance with cooking/housework;Direct supervision/assist for medications management;Direct supervision/assist for financial  management;Assist for transportation;Help with stairs or ramp for entrance   Equipment Recommendations  None recommended by PT    Recommendations for Other Services       Precautions / Restrictions Precautions Precautions: Fall Precaution Comments: fall in February with pelvic fx Restrictions Weight Bearing Restrictions: No     Mobility  Bed Mobility               General bed mobility comments: Pt in recliner.    Transfers Overall transfer level: Needs assistance Equipment used: Rolling walker (2 wheels) Transfers: Sit to/from Stand Sit to Stand: Min assist           General transfer comment: cues for hand placement, min A to power up, sit to stand x 2 trials, pt reported severe B foot pain in standing, unable to ambulate, unable to march in place in standing    Ambulation/Gait                   Stairs             Wheelchair Mobility    Modified Rankin (Stroke Patients Only)       Balance Overall balance assessment: Needs assistance Sitting-balance support: Feet supported Sitting balance-Leahy Scale: Good     Standing balance support: During functional activity, Reliant on assistive device for balance Standing balance-Leahy Scale: Poor                              Cognition Arousal/Alertness: Awake/alert Behavior During Therapy: WFL for tasks assessed/performed Overall Cognitive Status: Within Functional Limits for tasks assessed  Exercises General Exercises - Upper Extremity Shoulder Flexion: AROM, Both, 10 reps, Seated General Exercises - Lower Extremity Ankle Circles/Pumps: AROM, Both, 10 reps, Seated Long Arc Quad: AROM, Both, 10 reps, Seated Hip Flexion/Marching: AROM, Both, 10 reps, Seated    General Comments        Pertinent Vitals/Pain Pain Assessment Pain Score: 8  Pain Location: Bilateral feet in standing Pain Descriptors / Indicators:  Aching, Discomfort, Sore Pain Intervention(s): Limited activity within patient's tolerance, Monitored during session, Patient requesting pain meds-RN notified    Home Living                          Prior Function            PT Goals (current goals can now be found in the care plan section) Acute Rehab PT Goals Patient Stated Goal: get back to her home PT Goal Formulation: With patient Time For Goal Achievement: 08/20/21 Potential to Achieve Goals: Good Progress towards PT goals: Not progressing toward goals - comment (pain in feet limiteing activity tolerance)    Frequency    Min 3X/week      PT Plan Current plan remains appropriate    Co-evaluation              AM-PAC PT "6 Clicks" Mobility   Outcome Measure  Help needed turning from your back to your side while in a flat bed without using bedrails?: A Little Help needed moving from lying on your back to sitting on the side of a flat bed without using bedrails?: A Little Help needed moving to and from a bed to a chair (including a wheelchair)?: A Little Help needed standing up from a chair using your arms (e.g., wheelchair or bedside chair)?: A Little Help needed to walk in hospital room?: Total Help needed climbing 3-5 steps with a railing? : Total 6 Click Score: 14    End of Session Equipment Utilized During Treatment: Gait belt;Oxygen Activity Tolerance: Patient limited by pain Patient left: in chair;with call bell/phone within reach;with chair alarm set Nurse Communication: Mobility status;Patient requests pain meds PT Visit Diagnosis: Muscle weakness (generalized) (M62.81);Difficulty in walking, not elsewhere classified (R26.2);Unsteadiness on feet (R26.81)     Time: 7948-0165 PT Time Calculation (min) (ACUTE ONLY): 19 min  Charges:  $Therapeutic Activity: 8-22 mins                     Blondell Reveal Kistler PT 08/08/2021  Acute Rehabilitation Services  Office [phone]

## 2021-08-09 ENCOUNTER — Encounter (HOSPITAL_BASED_OUTPATIENT_CLINIC_OR_DEPARTMENT_OTHER): Payer: Medicare Other | Admitting: Orthopaedic Surgery

## 2021-08-09 DIAGNOSIS — K219 Gastro-esophageal reflux disease without esophagitis: Secondary | ICD-10-CM | POA: Diagnosis not present

## 2021-08-09 DIAGNOSIS — I5033 Acute on chronic diastolic (congestive) heart failure: Secondary | ICD-10-CM | POA: Diagnosis not present

## 2021-08-09 DIAGNOSIS — N186 End stage renal disease: Secondary | ICD-10-CM | POA: Diagnosis not present

## 2021-08-09 DIAGNOSIS — E7800 Pure hypercholesterolemia, unspecified: Secondary | ICD-10-CM | POA: Diagnosis not present

## 2021-08-09 DIAGNOSIS — G4733 Obstructive sleep apnea (adult) (pediatric): Secondary | ICD-10-CM | POA: Diagnosis not present

## 2021-08-09 DIAGNOSIS — J4520 Mild intermittent asthma, uncomplicated: Secondary | ICD-10-CM | POA: Diagnosis not present

## 2021-08-09 DIAGNOSIS — I5041 Acute combined systolic (congestive) and diastolic (congestive) heart failure: Secondary | ICD-10-CM | POA: Diagnosis not present

## 2021-08-09 DIAGNOSIS — G8929 Other chronic pain: Secondary | ICD-10-CM | POA: Diagnosis not present

## 2021-08-09 DIAGNOSIS — I2510 Atherosclerotic heart disease of native coronary artery without angina pectoris: Secondary | ICD-10-CM | POA: Diagnosis not present

## 2021-08-09 DIAGNOSIS — Z951 Presence of aortocoronary bypass graft: Secondary | ICD-10-CM | POA: Diagnosis not present

## 2021-08-09 DIAGNOSIS — N184 Chronic kidney disease, stage 4 (severe): Secondary | ICD-10-CM | POA: Diagnosis not present

## 2021-08-09 DIAGNOSIS — M1000 Idiopathic gout, unspecified site: Secondary | ICD-10-CM

## 2021-08-09 DIAGNOSIS — E1169 Type 2 diabetes mellitus with other specified complication: Secondary | ICD-10-CM | POA: Diagnosis not present

## 2021-08-09 LAB — BASIC METABOLIC PANEL
Anion gap: 10 (ref 5–15)
BUN: 43 mg/dL — ABNORMAL HIGH (ref 8–23)
CO2: 40 mmol/L — ABNORMAL HIGH (ref 22–32)
Calcium: 8.8 mg/dL — ABNORMAL LOW (ref 8.9–10.3)
Chloride: 88 mmol/L — ABNORMAL LOW (ref 98–111)
Creatinine, Ser: 2.69 mg/dL — ABNORMAL HIGH (ref 0.44–1.00)
GFR, Estimated: 18 mL/min — ABNORMAL LOW (ref >60)
Glucose, Bld: 165 mg/dL — ABNORMAL HIGH (ref 70–99)
Potassium: 3.9 mmol/L (ref 3.5–5.1)
Sodium: 138 mmol/L (ref 135–145)

## 2021-08-09 LAB — CBC
HCT: 29.7 % — ABNORMAL LOW (ref 36.0–46.0)
Hemoglobin: 8.9 g/dL — ABNORMAL LOW (ref 12.0–15.0)
MCH: 31.2 pg (ref 26.0–34.0)
MCHC: 30 g/dL (ref 30.0–36.0)
MCV: 104.2 fL — ABNORMAL HIGH (ref 80.0–100.0)
Platelets: 130 10*3/uL — ABNORMAL LOW (ref 150–400)
RBC: 2.85 MIL/uL — ABNORMAL LOW (ref 3.87–5.11)
RDW: 12.4 % (ref 11.5–15.5)
WBC: 5 10*3/uL (ref 4.0–10.5)
nRBC: 0 % (ref 0.0–0.2)

## 2021-08-09 LAB — GLUCOSE, CAPILLARY
Glucose-Capillary: 141 mg/dL — ABNORMAL HIGH (ref 70–99)
Glucose-Capillary: 130 mg/dL — ABNORMAL HIGH (ref 70–99)
Glucose-Capillary: 164 mg/dL — ABNORMAL HIGH (ref 70–99)
Glucose-Capillary: 263 mg/dL — ABNORMAL HIGH (ref 70–99)

## 2021-08-09 MED ORDER — METOPROLOL SUCCINATE ER 25 MG PO TB24
25.0000 mg | ORAL_TABLET | Freq: Every day | ORAL | Status: DC
  Administered 2021-08-09: 25 mg via ORAL
  Filled 2021-08-09: qty 1

## 2021-08-09 MED ORDER — ALBUTEROL SULFATE (2.5 MG/3ML) 0.083% IN NEBU
INHALATION_SOLUTION | RESPIRATORY_TRACT | Status: AC
  Filled 2021-08-09: qty 3

## 2021-08-09 MED ORDER — ALBUTEROL SULFATE (2.5 MG/3ML) 0.083% IN NEBU
2.5000 mg | INHALATION_SOLUTION | Freq: Once | RESPIRATORY_TRACT | Status: AC
Start: 2021-08-09 — End: 2021-08-09
  Administered 2021-08-09: 2.5 mg via RESPIRATORY_TRACT

## 2021-08-09 MED ORDER — NYSTATIN 100000 UNIT/GM EX POWD
Freq: Three times a day (TID) | CUTANEOUS | Status: DC
  Filled 2021-08-09: qty 15

## 2021-08-09 MED ORDER — FLUCONAZOLE 150 MG PO TABS
150.0000 mg | ORAL_TABLET | Freq: Once | ORAL | Status: AC
Start: 2021-08-09 — End: 2021-08-09
  Administered 2021-08-09: 150 mg via ORAL
  Filled 2021-08-09: qty 1

## 2021-08-09 MED ORDER — GUAIFENESIN 200 MG PO TABS
200.0000 mg | ORAL_TABLET | ORAL | Status: DC | PRN
  Administered 2021-08-09 – 2021-08-10 (×2): 200 mg via ORAL
  Filled 2021-08-09 (×4): qty 1

## 2021-08-09 NOTE — Progress Notes (Signed)
Chaplain received a referral to provide support and prayer for patient.  [name] requested prayer that she will be able to return home.  She has been living with her son since February and wants to be able to go home with her healthcare nurses and her other son's support.  Chaplain provided prayer and emotional support.  Chaplain Janne Napoleon, [place]ger, [phone]

## 2021-08-09 NOTE — Progress Notes (Signed)
Pharmacy Antibiotic Note  [name] is a 76 y.o. female admitted on 08/04/2021.  Pharmacy has been consulted for Fluconazole dosing for vaginal yeast infection  Plan: Fluconazole '150mg'$  PO x1 dose. Pharmacy will sign off at this time.  Please reconsult if a change in clinical status warrants re-evaluation of dosage.    Height: '5\' 3"'$  (160 cm) Weight: 100.2 kg (220 lb 14.4 oz) IBW/kg (Calculated) : 52.4  Temp (24hrs), Avg:98.1 F (36.7 C), Min:97.7 F (36.5 C), Max:98.4 F (36.9 C)  Recent Labs  Lab 08/05/21 0747 08/06/21 0341 08/07/21 0358 08/08/21 0412 08/09/21 0402  WBC 4.7 5.0 5.1 5.6 5.0  CREATININE 3.05* 3.28* 2.94* 2.92* 2.69*    Estimated Creatinine Clearance: 19.8 mL/min (A) (by C-G formula based on SCr of 2.69 mg/dL (H)).    Allergies  Allergen Reactions   Ivp Dye [Iodinated Contrast Media] Shortness Of Breath    No reaction to PO contrast with non-ionic dye.06-25-2014/rsm   Shellfish Allergy Anaphylaxis   Sulfa Antibiotics Shortness Of Breath   Iodine Hives   Atorvastatin Other (See Comments)    Pt states "causes bilateral leg pain/cramps."   Benadryl [Diphenhydramine] Other (See Comments)    "THIS DRIVES ME CRAZY AND MAKES ME FEEL LIKE I AM DYING"    Colchicine Nausea And Vomiting   Contrast Media [Iodinated Contrast Media] Hives   Doxycycline Other (See Comments)    Unknown reaction, per patient   Uloric [Febuxostat] Other (See Comments)    Unknown reaction   Cephalexin Itching and Rash   Zithromax [Azithromycin] Rash   Thank you for allowing pharmacy to be a part of this patient's care.  Gretta Arab PharmD, BCPS Clinical Pharmacist [facility] main pharmacy [phone] 08/09/2021 2:36 PM

## 2021-08-09 NOTE — Progress Notes (Signed)
PROGRESS NOTE    [name]  LFY:[id number] DOB: March 16, 1945 DOA: 08/04/2021 PCP: Lujean Amel, MD   Brief Narrative:   [name] is a 76 y.o. female with a history of chronic diastolic CHF, CAD s/p CABG, CHB s/p pacemaker who was recently on dialysis for ATN and has been off dialysis and dialysis catheter removed about 3 weeks ago, recently treated for lower extremity cellulitis who presented to the ED 6/25 with worsening dyspnea, lower extremity edema and erythema. BNP was elevated, CXR nonspecific left base opacity favored to represent atelectasis. LVEF 45-50% by echo. IV lasix was started and the patient was admitted with cardiology consultation for acute CHF. D-dimer was positive, though suspicion for PE is low. On 6/29, respiratory status has improved and diuresis is transitioned to oral lasix per cardiology. She has developed acute gout in the right foot, however, which severely limits her mobility for which steroids are started and now improving  Assessment and Plan: No notes have been filed under this hospital service. Service: Hospitalist  Acute on chronic hypoxic respiratory failure due to acute on chronic combined HFrEF:  -Pt is s/p CRT-P with improvement in LVEF with pacing and GDMT, now 45-50% -Continue lasix, transition '80mg'$  IV > PO BID yesterday per cardiology., monitoring daily weights (down 101.1 > 99.2kg which is likely EDW).  -Weaned back to home 2L O2   -She is -5.36 L since admission -Continue to monitor respiratory status carefully and she did not desaturate on ambulatory home O2 screen today.   Acute gout: -Primarily affecting right 1st MTP, though diffuse pain is noted in bilateral R > L feet/ankles. Developing this morning. Not NSAID candidate, has allergies to febuxostat and colchicine.  - Start IV steroid and received a dose of IV Solu-Medrol 40 mg yesterday and today.  - Note she was ambulating 96f w/PT previously, but unable to ambulate yesterday  due to pain. Would not currently be a safe discharge to home however she improved her mobility today with PT and ambulated 60 feet with 3 seated rest breaks between with her rolling walker; she did not desaturate   Stage IV CKD: -Recently on HD due to ATN developing postoperatively after hip fracture and replacement, with subsequent renal recovery.  - Monitor BMP, Cr is stable. -Patient's BUNs/creatinine is improving with diuresis and went from 44/2.92 is now 43/2.69 -Avoid further nephrotoxic medications, contrast dyes, hypotension and dehydration to ensure adequate renal perfusion and renally dose medications -Repeat CMP in a.m. and have routine follow-up with nephrology in outpatient setting   Bilateral lower leg cellulitis: Treated as such since arrival with reported improvement. Tx with unasyn is limited by medication intolerances/allergies.  - With improvement, and diarrhea likely associated with abx, will plan to limit Tx to 5 days (last dose 6/30 AM). If needing to continue, would change to alternative agent to avoid the salt load and preferably narrow scope.    CAD s/p CABG, HLD with demand myocardial ischemia: Troponin trend is flat without anginal complaint.  - No inpatient work up indicated - Continue home DAPT, high-intensity statin (augment to rosuvastatin '40mg'$  daily), imdur -Cardiology adding metoprolol succinate  - Check lipid panel in next 2-3 months, LDL is 119.  Vaginal yeast infection -Start nystatin powder and consulted pharmacy for fluconazole one-time dose of 150 mg    PAF:  - 95% pacing on interrogation per cardiology. Rate largely controlled. - Not on anticoagulation due to DAPT and low burden of AFib.   HTN, hypotension:   -  Stable on home imdur, does not currently appear to require midodrine.  -Continue monitor blood pressures per protocol; cardiology has added metoprolol succinate 25 mg p.o. daily and will uptitrate as needed   NIDT2DM:  -HbA1c 6.3%.  -  Continue SSI. Hold home medications while admitted. -Continue monitor CBGs carefully and they are ranging from 95-282   OSA: -Nonadherent to CPAP at home - Encouraged CPAP qHS.     Elevated d-dimer -LE venous doppler negative for DVT. Clinical scenario not strongly suggestive of PE, so will stop empiric heparin as she is at risk of bleeding and declines further PE evaluation at this time.    Anemia of chronic kidney disease and B12 deficiency:  -No bleeding noted. Iron stores sufficient. - Supplement B12 IM while admitted, then PO at discharge - Supplement folic acid which is borderline.  - Monitor Hgb intermittently. -Hemoglobin/hematocrit went from 9.0/30.7 and is now 8.9/29.7 with an MCV of 104.2 -Continue monitor for signs and symptoms of bleeding; no overt bleeding noted as above -Repeat CBC in a.m.   Thrombocytopenia:  -Continue to monitor for signs and symptoms of bleeding -Platelet count went from 135 now 130 -Repeat CBC in a.m.   Obesity -Complicates overall prognosis and care -Estimated body mass index is 39.13 kg/m as calculated from the following:   Height as of this encounter: '5\' 3"'$  (1.6 m).   Weight as of this encounter: 100.2 kg.  -Weight Loss and Dietary Counseling given  DVT prophylaxis: heparin injection 5,000 Units Start: 08/07/21 1400    Code Status: Full Code Family Communication: No family currently at bedside  Disposition Plan:  Level of care: Progressive Status is: Inpatient Remains inpatient appropriate because: Cardiology is adjusting medications and PT OT now recommending home health   Consultants:  Cardiology  Procedures:  Echocardiogram  Antimicrobials:  Anti-infectives (From admission, onward)    Start     Dose/Rate Route Frequency Ordered Stop   08/09/21 1530  fluconazole (DIFLUCAN) tablet 150 mg        150 mg Oral  Once 08/09/21 1437 08/09/21 1711   08/05/21 1000  Ampicillin-Sulbactam (UNASYN) 3 g in sodium chloride 0.9 % 100 mL  IVPB        3 g 200 mL/hr over 30 Minutes Intravenous Every 12 hours 08/04/21 2241 08/09/21 1010   08/04/21 2230  Ampicillin-Sulbactam (UNASYN) 3 g in sodium chloride 0.9 % 100 mL IVPB        3 g 200 mL/hr over 30 Minutes Intravenous  Once 08/04/21 2139 08/05/21 0700       Subjective: Seen and examined at bedside and states that she is doing a little bit better today with her pain in her feet.  She states that she is a little bit sleepy.  No nausea or vomiting.  Objective: Vitals:   08/08/21 2110 08/09/21 0509 08/09/21 0846 08/09/21 1334  BP: (!) 147/68 (!) 147/95  (!) 156/95  Pulse: (!) 44 96  97  Resp: 20 (!) 21  20  Temp: 98.4 F (36.9 C) 97.7 F (36.5 C)  98.1 F (36.7 C)  TempSrc: Oral Oral  Oral  SpO2: 95% 100% 96% 100%  Weight:  100.2 kg    Height:        Intake/Output Summary (Last 24 hours) at 08/09/2021 1823 Last data filed at 08/09/2021 0600 Gross per 24 hour  Intake 700 ml  Output 800 ml  Net -100 ml   Filed Weights   08/07/21 0500 08/08/21 0453 08/09/21 6063  Weight: 99.2 kg 99.7 kg 100.2 kg   Examination: Physical Exam:  Constitutional: WN/WD obese Caucasian female currently no acute distress Respiratory: Diminished to auscultation bilaterally with coarse breath sounds, no wheezing, rales, rhonchi or crackles. Normal respiratory effort and patient is not tachypenic. No accessory muscle use.  Unlabored breathing and not wearing supplemental oxygen via nasal cannula Cardiovascular: RRR, no murmurs / rubs / gallops. S1 and S2 auscultated.  Has 1+ lower extremity edema Abdomen: Soft, non-tender, distended secondary body habitus.  Bowel sounds positive.  GU: Deferred. Musculoskeletal: No clubbing / cyanosis of digits/nails. No joint deformity upper and lower extremities.  Neurologic: CN 2-12 grossly intact with no focal deficits.  Romberg sign and cerebellar reflexes not assessed.  Psychiatric: Normal judgment and insight. Alert and oriented x 3. Normal mood  and appropriate affect.   Data Reviewed: I have personally reviewed following labs and imaging studies  CBC: Recent Labs  Lab 08/04/21 1616 08/05/21 0747 08/06/21 0341 08/07/21 0358 08/08/21 0412 08/09/21 0402  WBC 6.7 4.7 5.0 5.1 5.6 5.0  NEUTROABS 4.7  --   --   --   --   --   HGB 10.2* 10.5* 9.4* 9.6* 9.0* 8.9*  HCT 34.8* 36.1 31.7* 32.7* 30.7* 29.7*  MCV 106.1* 105.9* 105.3* 106.2* 107.0* 104.2*  PLT 167 143* 149* 145* 135* 638*   Basic Metabolic Panel: Recent Labs  Lab 08/05/21 0747 08/06/21 0341 08/07/21 0358 08/08/21 0412 08/09/21 0402  NA 141 142 143 143 138  K 3.5 3.6 3.0* 3.2* 3.9  CL 89* 90* 91* 90* 88*  CO2 40* 42* 40* 44* 40*  GLUCOSE 180* 117* 112* 119* 165*  BUN 45* 51* 43* 44* 43*  CREATININE 3.05* 3.28* 2.94* 2.92* 2.69*  CALCIUM 9.4 8.7* 9.0 9.2 8.8*   GFR: Estimated Creatinine Clearance: 19.8 mL/min (A) (by C-G formula based on SCr of 2.69 mg/dL (H)). Liver Function Tests: Recent Labs  Lab 08/04/21 1630  AST 14*  ALT 11  ALKPHOS 88  BILITOT 0.6  PROT 6.1*  ALBUMIN 3.1*   No results for input(s): "LIPASE", "AMYLASE" in the last 168 hours. No results for input(s): "AMMONIA" in the last 168 hours. Coagulation Profile: No results for input(s): "INR", "PROTIME" in the last 168 hours. Cardiac Enzymes: No results for input(s): "CKTOTAL", "CKMB", "CKMBINDEX", "TROPONINI" in the last 168 hours. BNP (last 3 results) No results for input(s): "PROBNP" in the last 8760 hours. HbA1C: No results for input(s): "HGBA1C" in the last 72 hours. CBG: Recent Labs  Lab 08/08/21 1706 08/08/21 2107 08/09/21 0741 08/09/21 1142 08/09/21 1639  GLUCAP 180* 282* 141* 130* 164*   Lipid Profile: Recent Labs    08/08/21 0412  CHOL 189  HDL 51  LDLCALC 119*  TRIG 97  CHOLHDL 3.7   Thyroid Function Tests: No results for input(s): "TSH", "T4TOTAL", "FREET4", "T3FREE", "THYROIDAB" in the last 72 hours. Anemia Panel: Recent Labs    08/08/21 0412   VITAMINB12 109*  FOLATE 7.6  FERRITIN 376*  TIBC 225*  IRON 34  RETICCTPCT 2.5   Sepsis Labs: No results for input(s): "PROCALCITON", "LATICACIDVEN" in the last 168 hours.  Recent Results (from the past 240 hour(s))  SARS Coronavirus 2 by RT PCR (hospital order, performed in [facility] hospital lab) *cepheid single result test* Anterior Nasal Swab     Status: None   Collection Time: 08/04/21  4:16 PM   Specimen: Anterior Nasal Swab  Result Value Ref Range Status   SARS Coronavirus 2 by RT  PCR NEGATIVE NEGATIVE Final    Comment: (NOTE) SARS-CoV-2 target nucleic acids are NOT DETECTED.  The SARS-CoV-2 RNA is generally detectable in upper and lower respiratory specimens during the acute phase of infection. The lowest concentration of SARS-CoV-2 viral copies this assay can detect is 250 copies / mL. A negative result does not preclude SARS-CoV-2 infection and should not be used as the sole basis for treatment or other patient management decisions.  A negative result may occur with improper specimen collection / handling, submission of specimen other than nasopharyngeal swab, presence of viral mutation(s) within the areas targeted by this assay, and inadequate number of viral copies (<250 copies / mL). A negative result must be combined with clinical observations, patient history, and epidemiological information.  Fact Sheet for Patients:   https://www.patel.info/  Fact Sheet for Healthcare Providers: https://hall.com/  This test is not yet approved or  cleared by the [place] FDA and has been authorized for detection and/or diagnosis of SARS-CoV-2 by FDA under an Emergency Use Authorization (EUA).  This EUA will remain in effect (meaning this test can be used) for the duration of the COVID-19 declaration under Section 564(b)(1) of the Act, 21 U.S.C. section 360bbb-3(b)(1), unless the authorization is terminated or revoked  sooner.  Performed at [facility], [place] [address] [place], [place] [address]     Radiology Studies: No results found.  Scheduled Meds:  allopurinol  100 mg Oral q morning   aspirin EC  81 mg Oral q morning   clopidogrel  75 mg Oral QHS   cyanocobalamin  1,000 mcg Intramuscular Daily   cycloSPORINE  1 drop Both Eyes BID   folic acid  1 mg Oral Daily   furosemide  80 mg Oral BID   heparin injection (subcutaneous)  5,000 Units Subcutaneous Q8H   insulin aspart  0-6 Units Subcutaneous TID WC   isosorbide mononitrate  90 mg Oral Daily   methylPREDNISolone (SOLU-MEDROL) injection  40 mg Intravenous Q24H   metoprolol succinate  25 mg Oral Daily   mometasone-formoterol  2 puff Inhalation BID   nystatin   Topical TID   rosuvastatin  40 mg Oral QPM   Continuous Infusions:  sodium chloride Stopped (08/05/21 1207)    LOS: 5 days   [name], DO [facility] Available via Epic secure chat 7am-7pm After these hours, please refer to coverage provider listed on [email or web address] 08/09/2021, 6:23 PM

## 2021-08-09 NOTE — Progress Notes (Signed)
PT states that she is non compliant with PAP therapy at home for OSA. PT states she does not desire to utilize PAP therapy this admission.

## 2021-08-09 NOTE — Progress Notes (Signed)
Physical Therapy Treatment Patient Details Name: [name] MRN: [id number] DOB: Mar 08, 1945 Today's Date: 08/09/2021   History of Present Illness 76 year old female presenting to ED experiencing increasing shortness of breath for the last 3 days with pleuritic chest pain no fever chills or productive cough. In the ER patient has significant edema of the lower extremity and also erythema of both lower extremities. Chest x-ray shows nonspecific finding for concerning for opacity: "IMPRESSION: Increased opacity in the left base may represent atelectasis or developing infiltrate/pneumonia".  D-dimer was elevated.  Venous dopplars negative for evidence of DVTs. Medical history of chronic diastolic CHF, CAD status post CABG, complete heart block status post pacemaker placement was recently on dialysis and has been off dialysis and dialysis catheter removed about 3 weeks ago recently treated for lower extremity cellulitis. Pt seen on 04/03/21 after a fall with LT hip and pelvis fractures.    PT Comments    General Comments: AxO x 3 very pleasant Lady who plans to D/C to [facility], no steps to enter home. General bed mobility comments: OOB in recliner on 3 lts sats 94%.  Just finished working with OT. General transfer comment: 25% VC's on proper hand placement and safety to ensure Therapist locks recliner prior to sitting quickly.  Pt stated "legs feel better" but still nervous about falling. General Gait Details: cues for proximity to RW throughout. pt has tendendcy to push walker too far ahead.  Instructed on Purse Lip breathing during and after activity.  Educated on freq short walks several times a day.  Pt's sats actually increased with each walk.  Third walk sats increased to 100%.  HR avg was 89.  Dyspnea only 2/4.  Educated on decreasing her gait speed and breathing "deep and slow".  Also discussed her anxiety and to try to keep calm during gait.  Recommendations for follow up therapy are one  component of a multi-disciplinary discharge planning process, led by the attending physician.  Recommendations may be updated based on patient status, additional functional criteria and insurance authorization.  Follow Up Recommendations  Home health PT (Pt plans to D/C to [facility])     Assistance Recommended at Discharge Intermittent Supervision/Assistance  Patient can return home with the following A little help with walking and/or transfers;A little help with bathing/dressing/bathroom;Assistance with cooking/housework;Direct supervision/assist for medications management;Direct supervision/assist for financial management;Assist for transportation;Help with stairs or ramp for entrance   Equipment Recommendations  None recommended by PT    Recommendations for Other Services       Precautions / Restrictions Precautions Precautions: Fall Precaution Comments: fall in February with pelvic fx, monitor o2 sat, painfu;/gouty feet Restrictions Weight Bearing Restrictions: No     Mobility  Bed Mobility               General bed mobility comments: OOB in recliner on 3 lts sats 94%.  Just finished working with OT.    Transfers Overall transfer level: Needs assistance Equipment used: Rolling walker (2 wheels) Transfers: Sit to/from Stand Sit to Stand: Supervision           General transfer comment: 25% VC's on proper hand placement and safety to ensure Therapist locks recliner prior to sitting quickly.  Pt stated "legs feel better" but still nervous about falling.    Ambulation/Gait Ambulation/Gait assistance: Supervision, Min guard Gait Distance (Feet): 60 Feet (20 feet x 3 with seated rest breaks betwee) Assistive device: Rolling walker (2 wheels) Gait Pattern/deviations: Step-through pattern, Decreased stride  length, Decreased step length - left, Decreased step length - right, Trunk flexed, Wide base of support       General Gait Details: cues for proximity to RW  throughout. pt has tendendcy to push walker too far ahead.  Instructed on Purse Lip breathing during and after activity.  Educated on freq short walks several times a day.  Pt's sats actually increased with each walk.  Third walk sats increased to 100%.  HR avg was 89.  Dyspnea only 2/4.  Educated on decreasing her gait speed and breathing "deep and slow".  Also discussed her anxiety and to try to keep calm during gait.   Stairs             Wheelchair Mobility    Modified Rankin (Stroke Patients Only)       Balance                                            Cognition Arousal/Alertness: Awake/alert Behavior During Therapy: WFL for tasks assessed/performed Overall Cognitive Status: Within Functional Limits for tasks assessed                                 General Comments: AxO x 3 very pleasant Lady who plans to D/C to [facility], no steps to enter home.        Exercises      General Comments        Pertinent Vitals/Pain Pain Assessment Pain Assessment: Faces Faces Pain Scale: Hurts a little bit Pain Location: Bilateral feet in standing Pain Descriptors / Indicators: Aching, Grimacing Pain Intervention(s): Monitored during session    Home Living                          Prior Function            PT Goals (current goals can now be found in the care plan section) Progress towards PT goals: Progressing toward goals    Frequency    Min 3X/week      PT Plan Current plan remains appropriate    Co-evaluation              AM-PAC PT "6 Clicks" Mobility   Outcome Measure  Help needed turning from your back to your side while in a flat bed without using bedrails?: A Little Help needed moving from lying on your back to sitting on the side of a flat bed without using bedrails?: A Little Help needed moving to and from a bed to a chair (including a wheelchair)?: A Little Help needed standing up from a chair  using your arms (e.g., wheelchair or bedside chair)?: A Little Help needed to walk in hospital room?: A Little Help needed climbing 3-5 steps with a railing? : A Lot 6 Click Score: 17    End of Session Equipment Utilized During Treatment: Gait belt;Oxygen Activity Tolerance: Other (comment) (limited by dyspnea) Patient left: in chair;with call bell/phone within reach;with chair alarm set Nurse Communication: Mobility status PT Visit Diagnosis: Muscle weakness (generalized) (M62.81);Difficulty in walking, not elsewhere classified (R26.2);Unsteadiness on feet (R26.81)     Time: 1435-1500 PT Time Calculation (min) (ACUTE ONLY): 25 min  Charges:  $Gait Training: 8-22 mins $Therapeutic Activity: 8-22 mins  Rica Koyanagi  PTA Acute  Rehabilitation Services Office M-F          [phone] Weekend pager [phone]

## 2021-08-09 NOTE — Progress Notes (Signed)
,  Progress Note  Patient Name: [name] Date of Encounter: 08/09/2021  [facility] Cardiologist: Sanda Klein, MD   Subjective   Feeling well.  She has some pain in her knee.  She is happy that her legs are looking better and her foot pain is improved.  Inpatient Medications    Scheduled Meds:  allopurinol  100 mg Oral q morning   aspirin EC  81 mg Oral q morning   clopidogrel  75 mg Oral QHS   cyanocobalamin  1,000 mcg Intramuscular Daily   cycloSPORINE  1 drop Both Eyes BID   folic acid  1 mg Oral Daily   furosemide  80 mg Oral BID   heparin injection (subcutaneous)  5,000 Units Subcutaneous Q8H   insulin aspart  0-6 Units Subcutaneous TID WC   isosorbide mononitrate  90 mg Oral Daily   methylPREDNISolone (SOLU-MEDROL) injection  40 mg Intravenous Q24H   mometasone-formoterol  2 puff Inhalation BID   rosuvastatin  40 mg Oral QPM   Continuous Infusions:  sodium chloride Stopped (08/05/21 1207)   PRN Meds: sodium chloride, acetaminophen **OR** acetaminophen, albuterol, cetaphil, ondansetron (ZOFRAN) IV, mouth rinse, polyvinyl alcohol, sodium chloride   Vital Signs    Vitals:   08/08/21 1322 08/08/21 2110 08/09/21 0509 08/09/21 0846  BP: (!) 108/56 (!) 147/68 (!) 147/95   Pulse: 93 (!) 44 96   Resp: 20 20 (!) 21   Temp: 97.8 F (36.6 C) 98.4 F (36.9 C) 97.7 F (36.5 C)   TempSrc: Oral Oral Oral   SpO2: 98% 95% 100% 96%  Weight:   100.2 kg   Height:        Intake/Output Summary (Last 24 hours) at 08/09/2021 1025 Last data filed at 08/09/2021 0600 Gross per 24 hour  Intake 1416 ml  Output 1550 ml  Net -134 ml      08/09/2021    5:09 AM 08/08/2021    4:53 AM 08/07/2021    5:00 AM  Last 3 Weights  Weight (lbs) 220 lb 14.4 oz 219 lb 12.8 oz 218 lb 11.1 oz  Weight (kg) 100.2 kg 99.7 kg 99.2 kg      Telemetry    A sensed V paced.  Atrial tachycardia.- Personally Reviewed  ECG    N/a - Personally Reviewed  Physical Exam   VS:  BP (!)  147/95 (BP Location: Left Arm)   Pulse 96   Temp 97.7 F (36.5 C) (Oral)   Resp (!) 21   Ht '5\' 3"'$  (1.6 m)   Wt 100.2 kg   SpO2 96%   BMI 39.13 kg/m  , BMI Body mass index is 39.13 kg/m. GENERAL:  Well appearing HEENT: Pupils equal round and reactive, fundi not visualized, oral mucosa unremarkable NECK: Unable to assess JVD due to body habitus waveform within normal limits, carotid upstroke brisk and symmetric, no bruits, no thyromegaly LUNGS:  Clear to auscultation bilaterally HEART:  RRR.  PMI not displaced or sustained,S1 and S2 within normal limits, no S3, no S4, no clicks, no rubs, no murmurs ABD:  Flat, positive bowel sounds normal in frequency in pitch, no bruits, no rebound, no guarding, no midline pulsatile mass, no hepatomegaly, no splenomegaly EXT:  2 plus pulses throughout, trace lower extremity edema to the upper tibia bilaterally, no cyanosis no clubbing SKIN: Erythema of anterior tibia improved bilaterally NEURO:  Cranial nerves II through XII grossly intact, motor grossly intact throughout PSYCH:  Cognitively intact, oriented to person place and  time  Labs    High Sensitivity Troponin:   Recent Labs  Lab 08/04/21 1551 08/04/21 1800  TROPONINIHS 30* 30*     Chemistry Recent Labs  Lab 08/04/21 1630 08/05/21 0747 08/07/21 0358 08/08/21 0412 08/09/21 0402  NA  --    < > 143 143 138  K  --    < > 3.0* 3.2* 3.9  CL  --    < > 91* 90* 88*  CO2  --    < > 40* 44* 40*  GLUCOSE  --    < > 112* 119* 165*  BUN  --    < > 43* 44* 43*  CREATININE  --    < > 2.94* 2.92* 2.69*  CALCIUM  --    < > 9.0 9.2 8.8*  PROT 6.1*  --   --   --   --   ALBUMIN 3.1*  --   --   --   --   AST 14*  --   --   --   --   ALT 11  --   --   --   --   ALKPHOS 88  --   --   --   --   BILITOT 0.6  --   --   --   --   GFRNONAA  --    < > 16* 16* 18*  ANIONGAP  --    < > '12 9 10   '$ < > = values in this interval not displayed.    Lipids  Recent Labs  Lab 08/08/21 0412  CHOL 189  TRIG  97  HDL 51  LDLCALC 119*  CHOLHDL 3.7    Hematology Recent Labs  Lab 08/07/21 0358 08/08/21 0412 08/09/21 0402  WBC 5.1 5.6 5.0  RBC 3.08* 2.87*  2.87* 2.85*  HGB 9.6* 9.0* 8.9*  HCT 32.7* 30.7* 29.7*  MCV 106.2* 107.0* 104.2*  MCH 31.2 31.4 31.2  MCHC 29.4* 29.3* 30.0  RDW 12.9 12.7 12.4  PLT 145* 135* 130*   Thyroid No results for input(s): "TSH", "FREET4" in the last 168 hours.  BNP Recent Labs  Lab 08/04/21 1430  BNP 321.7*    DDimer  Recent Labs  Lab 08/04/21 2013  DDIMER 2.34*     Radiology    No results found.  Cardiac Studies   ECHO: 08/05/2021  1. Technically difficult study. Left ventricular ejection fraction, by  estimation, is 45 to 50%. The left ventricle has mildly decreased  function. The left ventricle demonstrates regional wall motion  abnormalities (see scoring diagram/findings for description). Apical hypokinesis. There is mild left ventricular hypertrophy. Left ventricular diastolic parameters are indeterminate.   2. Right ventricular systolic function is normal. The right ventricular  size is normal. There is mildly elevated pulmonary artery systolic  pressure. The estimated right ventricular systolic pressure is 77.8 mmHg.   3. The mitral valve is degenerative. No evidence of mitral valve  regurgitation. Moderate mitral annular calcification.   4. The aortic valve was not well visualized. There is moderate  calcification of the aortic valve. Aortic valve regurgitation is trivial.  Mild aortic valve stenosis. Vmax 2.5 m/s, MG 13 mmHg, AVA 2.0 cm^2, DI 0.48   5. The inferior vena cava is dilated in size with <50% respiratory  variability, suggesting right atrial pressure of 15 mmHg.    CARDIAC CATH 03/11/2019        Ost 1st Diag lesion is 60% stenosed. Ost 2nd Diag lesion  is 50% stenosed. Mid LAD lesion is 100% stenosed. Non-stenotic Ost Cx to Mid Cx lesion was previously treated. Prox RCA lesion is 100% stenosed. Non-stenotic Ost  Ramus to Ramus lesion was previously treated. Origin to Prox Graft lesion before Ramus is 100% stenosed.   Findings:   Ao = 136/68 (95)  LV =  155/11 RA =  7 RV = 34/9 PA = 34/17 (24) PCW = 14 Fick cardiac output/index = 6.8/3.1 PVR = 1.5 WU FA sat = 96% PA sat = 65%, 75%, 69%   Assessment: 1. Stable coronary anatomy with no change from previous. SVG to OM system chronically occluded with previous PCI of native LCX system. Remainder of grafts are widely patent 2. Very mild PAH with normal left-sided pressures. 3. Very difficult procedure given patient's size and fidgeting on cath table Diagnostic Dominance: Right    Patient Profile     Ms. Weikel is a 34F with CAD s/p CABG, chronic systolic and diastolic heart failure, morbid obesity, untreated OSA, DM, CHB s/p CRT-P, PAF, adrenal insufficiency, hypertension, CKD 4 admitted with acute on chronic diastolic heart failure and cellulitis.  Assessment & Plan    #Acute on chronic systolic diastolic heart failure: #Status post CRT-P: Systolic function has been as low as 20% and improved to 55-60% with guideline directed medical therapy and biventricular pacing.  She has had her generator changed out 12/2020.  She is followed in the advanced heart failure clinic and was last seen 01/2021.  She now presents with acute on chronic systolic and diastolic heart failure.  LVEF is now 45 to 50% with apical hypokinesis.  PASP is 41 mmHg.  She has mild aortic stenosis with a mean gradient of 13 mmHg.  Right atrial pressure was 15 mmHg.  She is not having any ischemic symptoms and she has acute on chronic renal failure.  No plan for any repeat ischemic evaluation at this time.  Creatinine continues to improve and she is diuresing on oral Lasix now. She is not on guideline directed medical therapy due to chronic kidney disease.  Pacing 95% on interrogation this admission.  We will add metoprolol succinate 25 mg daily.  #CAD status post CABG and  PCI: #Elevated troponin: #Hyperlipidemia: Currently no ischemic symptoms.  LVEF is reduced from her baseline, though better than it has been in the past as above.  No plan for repeat ischemic evaluation.  Continue aspirin, clopidogrel, and rosuvastatin.  High-sensitivity troponin was mildly elevated but flat in the low 30s.  More consistent with demand ischemia.  LDL is not at goal.  Increase rosuvastatin to '40mg'$ .  Repeat lipids/CMP in 2-3 months.  Adding metoprolol as above.  #PAF: Currently atrial paced.  She had 95% pacing.  Also some atrial tachycardia around 115 bpm on device interrogation.  I am seeing some of this on telemetry here as well.  We will add metoprolol succinate 25 mg daily and titrate upward as needed.  Per prior heart failure clinic notes, her A-fib burden has been low and therefore she was not started on anticoagulation, especially in the setting of dual antiplatelet therapy.  # OSA/OHS:  Patient unable to tolerate BiPAP or CPAP.  Continue supplemental oxygen.  # Hypertension/hypotension:  Patient is reportedly on midodrine at home.  She notes that her son takes care of all of her medicines and she is not sure whether she has been actually taking that at home.  Adding metoprolol and continue home Imdur.  For questions or updates, please  contact [place] Please consult [email or web address] for contact info under        Signed, Skeet Latch, MD  08/09/2021, 10:25 AM

## 2021-08-09 NOTE — Progress Notes (Signed)
Occupational Therapy Treatment Patient Details Name: [name] MRN: [id number] DOB: 07-02-1945 Today's Date: 08/09/2021   History of present illness 76 year old female presenting to ED experiencing increasing shortness of breath for the last 3 days with pleuritic chest pain no fever chills or productive cough. In the ER patient has significant edema of the lower extremity and also erythema of both lower extremities. Chest x-ray shows nonspecific finding for concerning for opacity: "IMPRESSION: Increased opacity in the left base may represent atelectasis or developing infiltrate/pneumonia".  D-dimer was elevated.  Venous dopplars negative for evidence of DVTs. Medical history of chronic diastolic CHF, CAD status post CABG, complete heart block status post pacemaker placement was recently on dialysis and has been off dialysis and dialysis catheter removed about 3 weeks ago recently treated for lower extremity cellulitis. Pt seen on 04/03/21 after a fall with LT hip and pelvis fractures.   OT comments  Treatment focused on self care tasks today and advancing mobility in order to eventually ambulate to bathroom. Patient assisted with donning underwear and shoes, and new hospital gown. Patient able to transfer to recliner, then ambulate 4 feet forward with recliner behind her. She then was able to ambulate 6 feet to [facility] and then back 6 feet to recliner. O2 sat 85% on 2 L and needed 3 L to recover. Patient tolerated activity well and motivated. Still reports bilateral feet pain.    Recommendations for follow up therapy are one component of a multi-disciplinary discharge planning process, led by the attending physician.  Recommendations may be updated based on patient status, additional functional criteria and insurance authorization.    Follow Up Recommendations  No OT follow up    Assistance Recommended at Discharge Frequent or constant Supervision/Assistance  Patient can return home with the  following  A little help with walking and/or transfers;A little help with bathing/dressing/bathroom   Equipment Recommendations  None recommended by OT    Recommendations for Other Services      Precautions / Restrictions Precautions Precautions: Fall Precaution Comments: fall in February with pelvic fx, monitor o2 sat, painfu;/gouty feet Restrictions Weight Bearing Restrictions: No       Mobility Bed Mobility                    Transfers                         Balance Overall balance assessment: Needs assistance Sitting-balance support: Feet supported Sitting balance-Leahy Scale: Good     Standing balance support: During functional activity, Reliant on assistive device for balance Standing balance-Leahy Scale: Poor                             ADL either performed or assessed with clinical judgement   ADL Overall ADL's : Needs assistance/impaired                     Lower Body Dressing: Total assistance Lower Body Dressing Details (indicate cue type and reason): assistance to don socks, underwear and shoes. Needing both hands on the walker to stabilize Toilet Transfer: Min guard;Rolling walker (2 wheels);BSC/3in1 Toilet Transfer Details (indicate cue type and reason): performed pseudo BSC transfer - ambulation 6 feet to [facility] to work on eventually ambulation to bathroom. Toileting- Clothing Manipulation and Hygiene: Moderate assistance Toileting - Clothing Manipulation Details (indicate cue type and reason): for clothing management -  during pseudo task     Functional mobility during ADLs: Min guard;Rolling walker (2 wheels) General ADL Comments: Patient supine in bed. Min assist for supine to sit. Min guard to stand and transfer to recliner on 2 L . Min guard to ambulate 4 feet and then sit in recliner. Min guard with RW to ambulate approx 12 feet - to [facility] and back. o2 sat 85% and needed 3 L to recover.    Extremity/Trunk  Assessment Upper Extremity Assessment Upper Extremity Assessment: Overall WFL for tasks assessed       Cervical / Trunk Assessment Cervical / Trunk Assessment: Other exceptions Cervical / Trunk Exceptions: habitus    Vision Patient Visual Report: No change from baseline     Perception     Praxis      Cognition Arousal/Alertness: Awake/alert Behavior During Therapy: WFL for tasks assessed/performed Overall Cognitive Status: Within Functional Limits for tasks assessed                                          Exercises      Shoulder Instructions       General Comments      Pertinent Vitals/ Pain       Pain Assessment Pain Assessment: Faces Faces Pain Scale: Hurts a little bit Pain Location: Bilateral feet in standing Pain Descriptors / Indicators: Aching, Grimacing Pain Intervention(s): Monitored during session  Home Living                                          Prior Functioning/Environment              Frequency  Min 2X/week        Progress Toward Goals  OT Goals(current goals can now be found in the care plan section)  Progress towards OT goals: Progressing toward goals  Acute Rehab OT Goals Patient Stated Goal: to walk to bathroom OT Goal Formulation: With patient Time For Goal Achievement: 08/20/21 Potential to Achieve Goals: Good  Plan Discharge plan remains appropriate    Co-evaluation                 AM-PAC OT "6 Clicks" Daily Activity     Outcome Measure   Help from another person eating meals?: None Help from another person taking care of personal grooming?: A Little Help from another person toileting, which includes using toliet, bedpan, or urinal?: A Lot Help from another person bathing (including washing, rinsing, drying)?: A Lot Help from another person to put on and taking off regular upper body clothing?: A Little Help from another person to put on and taking off regular lower body  clothing?: A Lot 6 Click Score: 16    End of Session Equipment Utilized During Treatment: Oxygen;Rolling walker (2 wheels)  OT Visit Diagnosis: Unsteadiness on feet (R26.81);Repeated falls (R29.6);History of falling (Z91.81);Pain Pain - part of body: Ankle and joints of foot   Activity Tolerance Patient tolerated treatment well   Patient Left in chair;with call bell/phone within reach;with chair alarm set   Nurse Communication Mobility status        Time: 9622-2979 OT Time Calculation (min): 31 min  Charges: OT General Charges $OT Visit: 1 Visit OT Treatments $Self Care/Home Management : 23-37 mins  Claudia Greenley, OTR/L Acute Care  Rehab Services  Office [phone] Pager: [place] 08/09/2021, 4:19 PM

## 2021-08-10 ENCOUNTER — Inpatient Hospital Stay (HOSPITAL_COMMUNITY): Payer: Medicare Other

## 2021-08-10 DIAGNOSIS — I2510 Atherosclerotic heart disease of native coronary artery without angina pectoris: Secondary | ICD-10-CM | POA: Diagnosis not present

## 2021-08-10 DIAGNOSIS — Z951 Presence of aortocoronary bypass graft: Secondary | ICD-10-CM | POA: Diagnosis not present

## 2021-08-10 DIAGNOSIS — I5041 Acute combined systolic (congestive) and diastolic (congestive) heart failure: Secondary | ICD-10-CM | POA: Diagnosis not present

## 2021-08-10 DIAGNOSIS — I5043 Acute on chronic combined systolic (congestive) and diastolic (congestive) heart failure: Secondary | ICD-10-CM

## 2021-08-10 DIAGNOSIS — G4733 Obstructive sleep apnea (adult) (pediatric): Secondary | ICD-10-CM | POA: Diagnosis not present

## 2021-08-10 DIAGNOSIS — I951 Orthostatic hypotension: Secondary | ICD-10-CM

## 2021-08-10 LAB — CBC WITH DIFFERENTIAL/PLATELET
Abs Immature Granulocytes: 0.04 10*3/uL (ref 0.00–0.07)
Basophils Absolute: 0 10*3/uL (ref 0.0–0.1)
Basophils Relative: 0 %
Eosinophils Absolute: 0 10*3/uL (ref 0.0–0.5)
Eosinophils Relative: 0 %
HCT: 28.4 % — ABNORMAL LOW (ref 36.0–46.0)
Hemoglobin: 8.9 g/dL — ABNORMAL LOW (ref 12.0–15.0)
Immature Granulocytes: 1 %
Lymphocytes Relative: 9 %
Lymphs Abs: 0.4 10*3/uL — ABNORMAL LOW (ref 0.7–4.0)
MCH: 31.7 pg (ref 26.0–34.0)
MCHC: 31.3 g/dL (ref 30.0–36.0)
MCV: 101.1 fL — ABNORMAL HIGH (ref 80.0–100.0)
Monocytes Absolute: 0.2 10*3/uL (ref 0.1–1.0)
Monocytes Relative: 3 %
Neutro Abs: 4.3 10*3/uL (ref 1.7–7.7)
Neutrophils Relative %: 87 %
Platelets: 137 10*3/uL — ABNORMAL LOW (ref 150–400)
RBC: 2.81 MIL/uL — ABNORMAL LOW (ref 3.87–5.11)
RDW: 12.4 % (ref 11.5–15.5)
WBC: 4.9 10*3/uL (ref 4.0–10.5)
nRBC: 0 % (ref 0.0–0.2)

## 2021-08-10 LAB — COMPREHENSIVE METABOLIC PANEL
ALT: 15 U/L (ref 0–44)
AST: 19 U/L (ref 15–41)
Albumin: 2.8 g/dL — ABNORMAL LOW (ref 3.5–5.0)
Alkaline Phosphatase: 70 U/L (ref 38–126)
Anion gap: 10 (ref 5–15)
BUN: 49 mg/dL — ABNORMAL HIGH (ref 8–23)
CO2: 38 mmol/L — ABNORMAL HIGH (ref 22–32)
Calcium: 8.7 mg/dL — ABNORMAL LOW (ref 8.9–10.3)
Chloride: 88 mmol/L — ABNORMAL LOW (ref 98–111)
Creatinine, Ser: 2.75 mg/dL — ABNORMAL HIGH (ref 0.44–1.00)
GFR, Estimated: 17 mL/min — ABNORMAL LOW (ref >60)
Glucose, Bld: 164 mg/dL — ABNORMAL HIGH (ref 70–99)
Potassium: 4.2 mmol/L (ref 3.5–5.1)
Sodium: 136 mmol/L (ref 135–145)
Total Bilirubin: 0.5 mg/dL (ref 0.3–1.2)
Total Protein: 5.9 g/dL — ABNORMAL LOW (ref 6.5–8.1)

## 2021-08-10 LAB — MAGNESIUM: Magnesium: 2 mg/dL (ref 1.7–2.4)

## 2021-08-10 LAB — GLUCOSE, CAPILLARY
Glucose-Capillary: 133 mg/dL — ABNORMAL HIGH (ref 70–99)
Glucose-Capillary: 112 mg/dL — ABNORMAL HIGH (ref 70–99)
Glucose-Capillary: 169 mg/dL — ABNORMAL HIGH (ref 70–99)
Glucose-Capillary: 245 mg/dL — ABNORMAL HIGH (ref 70–99)

## 2021-08-10 LAB — PHOSPHORUS: Phosphorus: 4.5 mg/dL (ref 2.5–4.6)

## 2021-08-10 MED ORDER — GUAIFENESIN 100 MG/5ML PO LIQD
10.0000 mL | ORAL | Status: DC | PRN
  Administered 2021-08-10 – 2021-08-14 (×2): 10 mL via ORAL
  Filled 2021-08-10 (×3): qty 10

## 2021-08-10 MED ORDER — METOPROLOL SUCCINATE ER 50 MG PO TB24
50.0000 mg | ORAL_TABLET | Freq: Every day | ORAL | Status: DC
  Administered 2021-08-10 – 2021-08-15 (×6): 50 mg via ORAL
  Filled 2021-08-10 (×6): qty 1

## 2021-08-10 MED ORDER — HYDRALAZINE HCL 10 MG PO TABS
10.0000 mg | ORAL_TABLET | Freq: Three times a day (TID) | ORAL | Status: DC
  Administered 2021-08-10 – 2021-08-11 (×4): 10 mg via ORAL
  Filled 2021-08-10 (×4): qty 1

## 2021-08-10 MED ORDER — PANTOPRAZOLE SODIUM 40 MG PO TBEC
40.0000 mg | DELAYED_RELEASE_TABLET | Freq: Two times a day (BID) | ORAL | Status: DC
  Administered 2021-08-10 – 2021-08-15 (×10): 40 mg via ORAL
  Filled 2021-08-10 (×10): qty 1

## 2021-08-10 NOTE — Progress Notes (Signed)
Pacemaker check per Dr. Caryl Comes.  Changed Max Track rate from 130 to 90 after discussion with Dr. Caryl Comes.  [facility] [place] [employer] [phone]

## 2021-08-10 NOTE — Progress Notes (Signed)
PROGRESS NOTE    [name]  AJO:[id number] DOB: 05/07/1945 DOA: 08/04/2021 PCP: Lujean Amel, MD   Brief Narrative:  No notes on file   [name] is a 76 y.o. female with a history of chronic diastolic CHF, CAD s/p CABG, CHB s/p pacemaker who was recently on dialysis for ATN and has been off dialysis and dialysis catheter removed about 3 weeks ago, recently treated for lower extremity cellulitis who presented to the ED 6/25 with worsening dyspnea, lower extremity edema and erythema. BNP was elevated, CXR nonspecific left base opacity favored to represent atelectasis. LVEF 45-50% by echo. IV lasix was started and the patient was admitted with cardiology consultation for acute CHF. D-dimer was positive, though suspicion for PE is low. On 6/29, respiratory status had improved and diuresis is transitioned to oral lasix per cardiology but given further decompensation she is transition back to IV Lasix on 08/10/2021. She has developed acute gout in the right foot, however, which severely limits her mobility for which steroids are started and now improving.  She now complains of some difficulty swallowing and feels like "phlegm is stuck in my throat".  SLP evaluated and recommending regular diet but if necessary placing on a dysphagia 3 diet with thin liquids.  Given her volume overload she was changed back to IV Lasix and hydralazine was given for her elevated blood pressure.  Given that she had atrial tachycardia with asynchronous AV pacing the cardiologist has asked the pacemaker rep to reprogram her device to try for heart rate down.  Cardiology is concern for amyloid and ordered a myeloma panel.  Cardiology is also recommended checking orthostatic vital signs and low-dose hydralazine for blood pressure control.  Assessment and Plan: No notes have been filed under this hospital service. Service: Hospitalist  Acute on chronic hypoxic respiratory failure due to acute on chronic combined  HFrEF:  -Pt is s/p CRT-P with improvement in LVEF with pacing and GDMT, now 45-50% -Continue lasix, transition '80mg'$  IV > PO BID the day before yesterday per cardiology but cardiology will be changed back to IV diuresis., monitoring daily weights (down 101.1 > 99.2kg which is likely EDW).  -Weaned back to home 2L O2 however she continues to complain of some dyspnea -She is - 6.479 L since admission -Continue to monitor respiratory status carefully and she did not desaturate on ambulatory home O2 screen yesterday but was more short of breath today -Per cardiology they will continue to augment her diuresis with IV diuresis with a renal function continues to worsen they will ask renal to evaluate. -Cardiology is for amyloid given that it could be contributing to her orthostasis so myeloma panel has been sent off   Acute gout: -Primarily affecting right 1st MTP, though diffuse pain is noted in bilateral R > L feet/ankles. Developing this morning. Not NSAID candidate, has allergies to febuxostat and colchicine.  - Start IV steroid and received a dose of IV Solu-Medrol 40 mg yesterday and today.  - Note she was ambulating 42f w/PT previously, but unable to ambulate yesterday due to pain. Would not currently be a safe discharge to home however she improved her mobility today with PT and ambulated 60 feet with 3 seated rest breaks between with her rolling walker; she did not desaturate   Stage IV CKD: -Recently on HD due to ATN developing postoperatively after hip fracture and replacement, with subsequent renal recovery.  - Monitor BMP, Cr is stable. -Patient's BUNs/creatinine was improving with diuresis and  went from 44/2.92  -> 43/2.69 -> but trended back up to 49/2.75 -Cardiology has now changed her p.o. diuresis back to IV -Avoid further nephrotoxic medications, contrast dyes, hypotension and dehydration to ensure adequate renal perfusion and renally dose medications -Repeat CMP in a.m. and have  routine follow-up with nephrology in outpatient setting   Bilateral lower leg cellulitis, improving  -Treated as such since arrival with reported improvement. Tx with unasyn is limited by medication intolerances/allergies.  - With improvement, and diarrhea likely associated with abx, will plan to limit Tx to 5 days (last dose 6/30 AM). If needing to continue, would change to alternative agent to avoid the salt load and preferably narrow scope.    CAD s/p CABG, HLD with demand myocardial ischemia: Troponin trend is flat without anginal complaint.  - No inpatient work up indicated - Continue home DAPT, high-intensity statin (augment to rosuvastatin '40mg'$  daily), imdur -Cardiology adding metoprolol succinate and now hydralazine - Check lipid panel in next 2-3 months, LDL is 119.   Vaginal yeast infection -Start nystatin powder and consulted pharmacy for fluconazole one-time dose of 150 mg    PAF:  - 95% pacing on interrogation per cardiology. Rate largely controlled however given her atrial tachycardia with AV pacing cardiology is asked the device rep to reprogram her pacer and recommending possibly adding a little calcium channel blocker - Not on anticoagulation due to DAPT and low burden of AFib.   HTN, hypotension:   - Stable on home imdur, does not currently appear to require midodrine but will check her orthostatic vital signs.  -Continue monitor blood pressures per protocol; cardiology has added metoprolol succinate 25 mg p.o. daily and will uptitrate as needed -Cardiology has now added hydralazine 10 mg q8h   NIDT2DM:  -HbA1c 6.3%.  - Continue SSI. Hold home medications while admitted. -Continue monitor CBGs carefully and they are ranging from 112-169   OSA: -Nonadherent to CPAP at home -Encouraged CPAP qHS.     Elevated d-dimer -LE venous doppler negative for DVT. Clinical scenario not strongly suggestive of PE, so will stop empiric heparin as she is at risk of bleeding and  declines further PE evaluation at this time.    Anemia of chronic kidney disease and B12 deficiency:  -No bleeding noted. Iron stores sufficient. - Supplement B12 IM while admitted, then PO at discharge - Supplement folic acid which is borderline.  - Monitor Hgb intermittently. -Hemoglobin/hematocrit is relatively stable at 8.9/28.4 with an MCV of 101.` -Continue monitor for signs and symptoms of bleeding; no overt bleeding noted as above -Repeat CBC in a.m.   Thrombocytopenia:  -Continue to monitor for signs and symptoms of bleeding  -Platelet count went from 135 -> 130 -> 137 -Repeat CBC in a.m.   Obesity -Complicates overall prognosis and care -Estimated body mass index is 39.17 kg/m as calculated from the following:   Height as of this encounter: '5\' 3"'$  (1.6 m).   Weight as of this encounter: 100.3 kg.  -Weight Loss and Dietary Counseling given   DVT prophylaxis: heparin injection 5,000 Units Start: 08/07/21 1400    Code Status: Full Code Family Communication: No family present at bedside   Disposition Plan:  Level of care: Progressive Status is: Inpatient Remains inpatient appropriate because: Is getting diuresed by cardiology now again and continues to be dyspneic; will be discharged once cleared by cardiology   Consultants:  Cardiology  Procedures:  Echocardiogram IMPRESSIONS     1. Technically difficult study. Left ventricular ejection  fraction, by  estimation, is 45 to 50%. The left ventricle has mildly decreased  function. The left ventricle demonstrates regional wall motion  abnormalities (see scoring diagram/findings for  description). Apical hypokinesis. There is mild left ventricular  hypertrophy. Left ventricular diastolic parameters are indeterminate.   2. Right ventricular systolic function is normal. The right ventricular  size is normal. There is mildly elevated pulmonary artery systolic  pressure. The estimated right ventricular systolic pressure  is 72.5 mmHg.   3. The mitral valve is degenerative. No evidence of mitral valve  regurgitation. Moderate mitral annular calcification.   4. The aortic valve was not well visualized. There is moderate  calcification of the aortic valve. Aortic valve regurgitation is trivial.  Mild aortic valve stenosis. Vmax 2.5 m/s, MG 13 mmHg, AVA 2.0 cm^2, DI  0.48   5. The inferior vena cava is dilated in size with <50% respiratory  variability, suggesting right atrial pressure of 15 mmHg.   FINDINGS   Left Ventricle: Left ventricular ejection fraction, by estimation, is 45  to 50%. The left ventricle has mildly decreased function. The left  ventricle demonstrates regional wall motion abnormalities. The left  ventricular internal cavity size was normal  in size. There is mild left ventricular hypertrophy. Left ventricular  diastolic parameters are indeterminate.      LV Wall Scoring:  The apical lateral segment, apical inferior segment, and apex are  hypokinetic. The entire anterior wall, antero-lateral wall, entire septum,  inferior wall, and posterior wall are normal.   Right Ventricle: The right ventricular size is normal. Right vetricular  wall thickness was not well visualized. Right ventricular systolic  function is normal. There is mildly elevated pulmonary artery systolic  pressure. The tricuspid regurgitant velocity   is 2.59 m/s, and with an assumed right atrial pressure of 15 mmHg, the  estimated right ventricular systolic pressure is 36.6 mmHg.   Left Atrium: Left atrial size was normal in size.   Right Atrium: Right atrial size was normal in size.   Pericardium: There is no evidence of pericardial effusion.   Mitral Valve: The mitral valve is degenerative in appearance. Moderate  mitral annular calcification. No evidence of mitral valve regurgitation.   Tricuspid Valve: The tricuspid valve is normal in structure. Tricuspid  valve regurgitation is trivial.   Aortic Valve:  The aortic valve was not well visualized. There is moderate  calcification of the aortic valve. Aortic valve regurgitation is trivial.  Mild aortic stenosis is present. Aortic valve mean gradient measures 11.7  mmHg. Aortic valve peak gradient   measures 22.9 mmHg. Aortic valve area, by VTI measures 2.03 cm.   Pulmonic Valve: The pulmonic valve was not well visualized. Pulmonic valve  regurgitation is trivial.   Aorta: The aortic root is normal in size and structure.   Venous: The inferior vena cava is dilated in size with less than 50%  respiratory variability, suggesting right atrial pressure of 15 mmHg.   IAS/Shunts: The interatrial septum was not well visualized.      LEFT VENTRICLE  PLAX 2D  LVIDd:         4.80 cm   Diastology  LVIDs:         3.00 cm   LV e' medial:    4.56 cm/s  LV PW:         1.20 cm   LV E/e' medial:  21.8  LV IVS:        1.10 cm   LV e' lateral:  6.75 cm/s  LVOT diam:     2.30 cm   LV E/e' lateral: 14.7  LV SV:         96  LV SV Index:   47  LVOT Area:     4.15 cm      RIGHT VENTRICLE  RV S prime:     5.68 cm/s  TAPSE (M-mode): 1.5 cm   LEFT ATRIUM             Index        RIGHT ATRIUM           Index  LA diam:        3.70 cm 1.83 cm/m   RA Area:     13.00 cm  LA Vol (A2C):   44.9 ml 22.18 ml/m  RA Volume:   26.40 ml  13.04 ml/m  LA Vol (A4C):   75.0 ml 37.04 ml/m  LA Biplane Vol: 64.2 ml 31.71 ml/m   AORTIC VALVE  AV Area (Vmax):    1.68 cm  AV Area (Vmean):   1.88 cm  AV Area (VTI):     2.03 cm  AV Vmax:           239.33 cm/s  AV Vmean:          158.333 cm/s  AV VTI:            0.473 m  AV Peak Grad:      22.9 mmHg  AV Mean Grad:      11.7 mmHg  LVOT Vmax:         97.00 cm/s  LVOT Vmean:        71.800 cm/s  LVOT VTI:          0.231 m  LVOT/AV VTI ratio: 0.49     AORTA  Ao Root diam: 3.70 cm  Ao Asc diam:  3.70 cm   MITRAL VALVE                TRICUSPID VALVE  MV Area (PHT): 4.06 cm     TR Peak grad:   26.8 mmHg  MV  Decel Time: 187 msec     TR Vmax:        259.00 cm/s  MV E velocity: 99.20 cm/s  MV A velocity: 145.00 cm/s  SHUNTS  MV E/A ratio:  0.68         Systemic VTI:  0.23 m                              Systemic Diam: 2.30 cm   Antimicrobials:  Anti-infectives (From admission, onward)    Start     Dose/Rate Route Frequency Ordered Stop   08/09/21 1530  fluconazole (DIFLUCAN) tablet 150 mg        150 mg Oral  Once 08/09/21 1437 08/09/21 1711   08/05/21 1000  Ampicillin-Sulbactam (UNASYN) 3 g in sodium chloride 0.9 % 100 mL IVPB        3 g 200 mL/hr over 30 Minutes Intravenous Every 12 hours 08/04/21 2241 08/09/21 1010   08/04/21 2230  Ampicillin-Sulbactam (UNASYN) 3 g in sodium chloride 0.9 % 100 mL IVPB        3 g 200 mL/hr over 30 Minutes Intravenous  Once 08/04/21 2139 08/05/21 0700       Subjective: Seen and examined at bedside and she states she was a little dyspneic.  Also complaining of "  phlegm getting caught in her throat".  No nausea or vomiting.  Does not feel as well today.  Denies any chest pain.  States her foot is getting better.  No other concerns or persistent.  Objective: Vitals:   08/09/21 1952 08/10/21 0516 08/10/21 0517 08/10/21 1358  BP: (!) 158/86  (!) 166/93 140/70  Pulse: (!) 110  (!) 109 78  Resp: '20  18 20  '$ Temp: 98.3 F (36.8 C)  97.7 F (36.5 C) 98 F (36.7 C)  TempSrc: Oral  Oral Oral  SpO2: 96%  100% 100%  Weight:  100.3 kg    Height:        Intake/Output Summary (Last 24 hours) at 08/10/2021 1650 Last data filed at 08/10/2021 0516 Gross per 24 hour  Intake 240 ml  Output 1350 ml  Net -1110 ml   Filed Weights   08/08/21 0453 08/09/21 0509 08/10/21 0516  Weight: 99.7 kg 100.2 kg 100.3 kg   Examination: Physical Exam:  Constitutional: WN/WD obese Caucasian female currently in no acute distress Respiratory: Diminished to auscultation bilaterally with coarse breath sounds and some crackles, no wheezing, rales, rhonchi. Normal respiratory effort and  patient is not tachypenic. No accessory muscle use.  Unlabored breathing but is wearing supplemental oxygen via nasal cannula Cardiovascular: RRR, no murmurs / rubs / gallops. S1 and S2 auscultated.  Has 1+ lower extremity edema Abdomen: Soft, non-tender, distended secondary body habitus. Bowel sounds positive.  GU: Deferred. Musculoskeletal: No clubbing / cyanosis of digits/nails. No joint deformity upper and lower extremities.  Neurologic: CN 2-12 grossly intact with no focal deficits. Romberg sign and cerebellar reflexes not assessed.  Psychiatric: Normal judgment and insight. Alert and oriented x 3. Normal mood and appropriate affect.   Data Reviewed: I have personally reviewed following labs and imaging studies  CBC: Recent Labs  Lab 08/04/21 1616 08/05/21 0747 08/06/21 0341 08/07/21 0358 08/08/21 0412 08/09/21 0402 08/10/21 0409  WBC 6.7   < > 5.0 5.1 5.6 5.0 4.9  NEUTROABS 4.7  --   --   --   --   --  4.3  HGB 10.2*   < > 9.4* 9.6* 9.0* 8.9* 8.9*  HCT 34.8*   < > 31.7* 32.7* 30.7* 29.7* 28.4*  MCV 106.1*   < > 105.3* 106.2* 107.0* 104.2* 101.1*  PLT 167   < > 149* 145* 135* 130* 137*   < > = values in this interval not displayed.   Basic Metabolic Panel: Recent Labs  Lab 08/06/21 0341 08/07/21 0358 08/08/21 0412 08/09/21 0402 08/10/21 0409  NA 142 143 143 138 136  K 3.6 3.0* 3.2* 3.9 4.2  CL 90* 91* 90* 88* 88*  CO2 42* 40* 44* 40* 38*  GLUCOSE 117* 112* 119* 165* 164*  BUN 51* 43* 44* 43* 49*  CREATININE 3.28* 2.94* 2.92* 2.69* 2.75*  CALCIUM 8.7* 9.0 9.2 8.8* 8.7*  MG  --   --   --   --  2.0  PHOS  --   --   --   --  4.5   GFR: Estimated Creatinine Clearance: 19.4 mL/min (A) (by C-G formula based on SCr of 2.75 mg/dL (H)). Liver Function Tests: Recent Labs  Lab 08/04/21 1630 08/10/21 0409  AST 14* 19  ALT 11 15  ALKPHOS 88 70  BILITOT 0.6 0.5  PROT 6.1* 5.9*  ALBUMIN 3.1* 2.8*   No results for input(s): "LIPASE", "AMYLASE" in the last 168  hours. No results for input(s): "AMMONIA" in  the last 168 hours. Coagulation Profile: No results for input(s): "INR", "PROTIME" in the last 168 hours. Cardiac Enzymes: No results for input(s): "CKTOTAL", "CKMB", "CKMBINDEX", "TROPONINI" in the last 168 hours. BNP (last 3 results) No results for input(s): "PROBNP" in the last 8760 hours. HbA1C: No results for input(s): "HGBA1C" in the last 72 hours. CBG: Recent Labs  Lab 08/09/21 1639 08/09/21 2113 08/10/21 0752 08/10/21 1157 08/10/21 1645  GLUCAP 164* 263* 133* 112* 169*   Lipid Profile: Recent Labs    08/08/21 0412  CHOL 189  HDL 51  LDLCALC 119*  TRIG 97  CHOLHDL 3.7   Thyroid Function Tests: No results for input(s): "TSH", "T4TOTAL", "FREET4", "T3FREE", "THYROIDAB" in the last 72 hours. Anemia Panel: Recent Labs    08/08/21 0412  VITAMINB12 109*  FOLATE 7.6  FERRITIN 376*  TIBC 225*  IRON 34  RETICCTPCT 2.5   Sepsis Labs: No results for input(s): "PROCALCITON", "LATICACIDVEN" in the last 168 hours.  Recent Results (from the past 240 hour(s))  SARS Coronavirus 2 by RT PCR (hospital order, performed in [facility] hospital lab) *cepheid single result test* Anterior Nasal Swab     Status: None   Collection Time: 08/04/21  4:16 PM   Specimen: Anterior Nasal Swab  Result Value Ref Range Status   SARS Coronavirus 2 by RT PCR NEGATIVE NEGATIVE Final    Comment: (NOTE) SARS-CoV-2 target nucleic acids are NOT DETECTED.  The SARS-CoV-2 RNA is generally detectable in upper and lower respiratory specimens during the acute phase of infection. The lowest concentration of SARS-CoV-2 viral copies this assay can detect is 250 copies / mL. A negative result does not preclude SARS-CoV-2 infection and should not be used as the sole basis for treatment or other patient management decisions.  A negative result may occur with improper specimen collection / handling, submission of specimen other than nasopharyngeal swab,  presence of viral mutation(s) within the areas targeted by this assay, and inadequate number of viral copies (<250 copies / mL). A negative result must be combined with clinical observations, patient history, and epidemiological information.  Fact Sheet for Patients:   https://www.patel.info/  Fact Sheet for Healthcare Providers: https://hall.com/  This test is not yet approved or  cleared by the [place] FDA and has been authorized for detection and/or diagnosis of SARS-CoV-2 by FDA under an Emergency Use Authorization (EUA).  This EUA will remain in effect (meaning this test can be used) for the duration of the COVID-19 declaration under Section 564(b)(1) of the Act, 21 U.S.C. section 360bbb-3(b)(1), unless the authorization is terminated or revoked sooner.  Performed at [facility], [place] [address], [place], [place] [address]     Radiology Studies: DG CHEST PORT 1 VIEW  Result Date: 08/10/2021 CLINICAL DATA:  Congestive heart failure.  Shortness of breath. EXAM: PORTABLE CHEST 1 VIEW COMPARISON:  Radiographs 08/04/2021, 06/11/2021 and 06/10/2021. CT 08/18/2013. FINDINGS: 0536 hours. Right subclavian AICD leads appear unchanged. There is stable mild cardiac enlargement status post median sternotomy and CABG. Small left pleural effusion or thickening and mild left lower lobe airspace disease are unchanged from the recent prior study, appearing mildly increased from May. The right lung is clear. There is no edema or pneumothorax. The bones appear unremarkable. IMPRESSION: No change in pleuroparenchymal opacities at the left lung base compared with prior study of 1 week ago. No evidence of edema. Electronically Signed   By: Richardean Sale M.D.   On: 08/10/2021 08:51    Scheduled  Meds:  allopurinol  100 mg Oral q morning   aspirin EC  81 mg Oral q morning   clopidogrel  75 mg Oral QHS   cycloSPORINE  1 drop Both Eyes BID    folic acid  1 mg Oral Daily   furosemide  80 mg Oral BID   heparin injection (subcutaneous)  5,000 Units Subcutaneous Q8H   hydrALAZINE  10 mg Oral Q8H   insulin aspart  0-6 Units Subcutaneous TID WC   isosorbide mononitrate  90 mg Oral Daily   methylPREDNISolone (SOLU-MEDROL) injection  40 mg Intravenous Q24H   metoprolol succinate  50 mg Oral Daily   mometasone-formoterol  2 puff Inhalation BID   nystatin   Topical TID   pantoprazole  40 mg Oral BID   rosuvastatin  40 mg Oral QPM   Continuous Infusions:  sodium chloride Stopped (08/05/21 1207)    LOS: 6 days   [name], DO [facility] Available via Epic secure chat 7am-7pm After these hours, please refer to coverage provider listed on [email or web address] 08/10/2021, 4:50 PM

## 2021-08-10 NOTE — Evaluation (Signed)
Clinical/Bedside Swallow Evaluation Patient Details  Name: [name] MRN: [id number] Date of Birth: 1945/05/29  Today's Date: 08/10/2021 Time: SLP Start Time (ACUTE ONLY): 1340 SLP Stop Time (ACUTE ONLY): 1400 SLP Time Calculation (min) (ACUTE ONLY): 20 min  Past Medical History:  Past Medical History:  Diagnosis Date   AICD (automatic cardioverter/defibrillator) present    downgraded to CRT-P in 2014   Anemia    Arthritis    Asthma    Atrial fibrillation ([place]) 10/24/2015   Questionable history of A Fib/A Flutter. On device interrogation on 9/7, showed 0.10mn of A Fib/A Flutter with 1% burden.  Device check in Jan 2018 showed no sustained AF (runs of less than 30 seconds). No anticoagulation indicated.   CAD (coronary artery disease)    a. 10/09/16 LHC: SVG->LAD patent, SVG->Diag patent, SVG->RCA patent, SVG->LCx occluded. EF 60%, b. 10/31/16 LHC DES to AV groove Circ, DES to intermed branch   CHF (congestive heart failure) (HCC)    Chronic lower back pain    Fatty liver disease, nonalcoholic    GERD (gastroesophageal reflux disease)    Gout    H/O hiatal hernia    High cholesterol    Hyperplastic colon polyp    Hypertension    IBS (irritable bowel syndrome)    Internal hemorrhoids    INTERNAL HEMORRHOIDS WITHOUT MENTION COMP 04/12/2007   Qualifier: Diagnosis of  By: BOlevia PerchesMD, Dora M    [place]ischemic cardiomyopathy [place] 11/22/2011   Pt responded to BiV ICD- last EF 55-60% Sept 2017   Obesity    OSA (obstructive sleep apnea)    "can't tolerate a mask", wears 2L nocturnal O2   Presence of permanent cardiac pacemaker    11/02/12 [employer] V273 INTUA PPM   Stroke ([facility] 09/2020   [place] II diabetes mellitus [place]    Past Surgical History:  Past Surgical History:  Procedure Laterality Date   ABDOMINAL ULTRASOUND  12/01/2011   Peripelvic cysts- #1- 2.4x1.9x2.3cm, #2-1.2x0.9x1.2cm   ANKLE FRACTURE SURGERY Right    "had rod put in"   ANTERIOR CERVICAL  DECOMP/DISCECTOMY FUSION     APPENDECTOMY     BACK SURGERY     BIOPSY  12/29/2017   Procedure: BIOPSY;  Surgeon: NMauri Pole MD;  [facility];  Service: Endoscopy;;   BIV ICD INSERTION CRT-D  2001?   BIV PACEMAKER GENERATOR CHANGE OUT N/A 11/02/2012   Procedure: BIV PACEMAKER GENERATOR CHANGE OUT;  Surgeon: MSanda Klein MD;  [facility];  Service: Cardiovascular;  Laterality: N/A;   CARDIAC CATHETERIZATION  05/17/1999   No significant coronary obstructive disease w/ mild 20% luminal irregularity of the first diag branch of the LAD   CARDIAC CATHETERIZATION  07/08/2002   No significant CAD, moderately depressed LV systolic function   CARDIAC CATHETERIZATION Bilateral 04/26/2007   Normal findings, recommend medical therapy   CARDIAC CATHETERIZATION  02/18/2008   Moderate CAD, would benefit from having a functional study, recommend continue medical therapy   CARDIAC CATHETERIZATION  07/23/2012   Medical therapy   CARDIAC CATHETERIZATION N/A 11/24/2014   Procedure: Left Heart Cath and Coronary Angiography;  Surgeon: TTroy Sine MD;  [place] [facility];  Service: Cardiovascular;  Laterality: N/A;   CARDIAC CATHETERIZATION  11/27/2014   Procedure: Intravascular Pressure Wire/FFR Study;  Surgeon: Peter M JMartinique MD;  [facility];  Service: Cardiovascular;;   CARDIAC CATHETERIZATION  10/09/2016   CHOLECYSTECTOMY N/A 04/09/2018   Procedure: LAPAROSCOPIC CHOLECYSTECTOMY;  Surgeon:  Greer Pickerel, MD;  [facility];  Service: General;  Laterality: N/A;   COLONOSCOPY WITH PROPOFOL N/A 12/29/2017   Procedure: COLONOSCOPY WITH PROPOFOL;  Surgeon: [name], MD;  [facility];  Service: Endoscopy;  Laterality: N/A;   CORONARY ANGIOGRAM  2010   CORONARY ARTERY BYPASS GRAFT N/A 11/29/2014   Procedure: CORONARY ARTERY BYPASS GRAFTING x 5 (LIMA-LAD, SVG-D, SVG-OM1-OM2, SVG-PD);  Surgeon: Melrose Nakayama, MD;  Location: [facility];   Service: Open Heart Surgery;  Laterality: N/A;   CORONARY STENT INTERVENTION N/A 10/31/2016   Procedure: CORONARY STENT INTERVENTION;  Surgeon: Burnell Blanks, MD;  Location: [place] CV LAB;  Service: Cardiovascular;  Laterality: N/A;   ESOPHAGOGASTRODUODENOSCOPY (EGD) WITH PROPOFOL N/A 12/29/2017   Procedure: ESOPHAGOGASTRODUODENOSCOPY (EGD) WITH PROPOFOL;  Surgeon: [name], MD;  [facility];  Service: Endoscopy;  Laterality: N/A;   FRACTURE SURGERY     INSERT / REPLACE / REMOVE PACEMAKER  1999   INTRAMEDULLARY (IM) NAIL INTERTROCHANTERIC Left 04/04/2021   Procedure: INTRAMEDULLARY (IM) NAIL INTERTROCHANTRIC;  Surgeon: Vanetta Mulders, MD;  Location: [facility];  Service: Orthopedics;  Laterality: Left;   IR FLUORO GUIDE CV LINE LEFT  04/11/2021   IR US GUIDE VASC ACCESS LEFT  04/11/2021   KNEE ARTHROSCOPY Bilateral    LEFT HEART CATH AND CORS/GRAFTS ANGIOGRAPHY N/A 12/09/2017   Procedure: LEFT HEART CATH AND CORS/GRAFTS ANGIOGRAPHY;  Surgeon: Troy Sine, MD;  Location: [place] CV LAB;  Service: Cardiovascular;  Laterality: N/A;   LEFT HEART CATHETERIZATION WITH CORONARY ANGIOGRAM N/A 07/23/2012   Procedure: LEFT HEART CATHETERIZATION WITH CORONARY ANGIOGRAM;  Surgeon: Leonie Man, MD;  Location: [facility] CATH LAB;  Service: Cardiovascular;  Laterality: N/A;   LEXISCAN MYOVIEW  11/14/2011   Mild-moderate defect seen in Mid Inferolateral and Mid Anterolateral regions-consistant w/ infarct/scar. No significant ischemia demonstrated.   POLYPECTOMY  12/29/2017   Procedure: POLYPECTOMY;  Surgeon: [name], MD;  Location[facility];  Service: Endoscopy;;   PPM GENERATOR CHANGEOUT N/A 12/31/2020   Procedure: PPM GENERATOR CHANGEOUT;  Surgeon: Sanda Klein, MD;  Location: [place] CV LAB;  Service: Cardiovascular;  Laterality: N/A;   RIGHT/LEFT HEART CATH AND CORONARY ANGIOGRAPHY N/A 10/09/2016   Procedure: RIGHT/LEFT HEART CATH AND CORONARY  ANGIOGRAPHY;  Surgeon: Jolaine Artist, MD;  Location: [place] CV LAB;  Service: Cardiovascular;  Laterality: N/A;   RIGHT/LEFT HEART CATH AND CORONARY/GRAFT ANGIOGRAPHY N/A 03/11/2019   Procedure: RIGHT/LEFT HEART CATH AND CORONARY/GRAFT ANGIOGRAPHY;  Surgeon: Jolaine Artist, MD;  Location: [place] CV LAB;  Service: Cardiovascular;  Laterality: N/A;   TEE WITHOUT CARDIOVERSION N/A 11/29/2014   Procedure: TRANSESOPHAGEAL ECHOCARDIOGRAM (TEE);  Surgeon: Melrose Nakayama, MD;  Location: [place];  Service: Open Heart Surgery;  Laterality: N/A;   TRANSCAROTID ARTERY REVASCULARIZATION  Right 10/01/2020   Procedure: RIGHT TRANSCAROTID ARTERY REVASCULARIZATION;  Surgeon: [name], MD;  Location: [facility];  Service: Vascular;  Laterality: Right;   TRANSTHORACIC ECHOCARDIOGRAM  07/23/2012   EF 55-60%, normal-mild   TUBAL LIGATION     ULTRASOUND GUIDANCE FOR VASCULAR ACCESS Left 10/01/2020   Procedure: ULTRASOUND GUIDANCE FOR VASCULAR ACCESS;  Surgeon: [name], MD;  Location: [place];  Service: Vascular;  Laterality: Left;   HPI:  Patient is a 76 y.o. female with PMH: chronic diastolic CHF, CAD s/p CABG, complete heart block s/p pacemaker, recently on dialysis but this has since been stopped and dialysis catheter removed 3 weeks ago. She presented 08/04/21  to the hospital with increasing SOB for 3 days prior with pleuritic chest pain. In ER, patient had significant edema of lower extremity and also erythema of both lower extremities. CXR on 6/25 showed Increased opacity in the left base may represent atelectasis or developing infiltrate/pneumonia and repeat CXR on 7/1 did not show any change in left lung base and no evidence of edema. She had Esophagram and MBS as an outpatient on 12/05/2020 with Esophagram showing stricturing of the distal esophagus but with 50m barium tablet able to pass through the GE junction and MBS showing trace laryngeal penetration of thin liquids but  no aspiration and chin tuck posture preventing penetration.    Assessment / Plan / Recommendation  Clinical Impression  Patient currently presenting with what appears to be a mild pharyngeal dysphagia but with esophageal component as is reported on Esophagram from 12/05/2020. SLP observed patient to exhibit one mild throat clear after straw sips of thin liquids (water) which is consistent with 12/05/2020 MBS which found trace laryngeal penetration of thin liquids but no aspiration and chin tuck effective to prevent penetration. SLP does not suspect that she has had a significant change in her swallow function and recommend she continue on prescribed diet and utilize chin tuck posture when drinking liquids. Patient understanding and able to perform chin tuck without difficulty. SLP will s/o at this time.Thank you for this referral! SLP Visit Diagnosis: Dysphagia, unspecified (R13.10)    Aspiration Risk  Mild aspiration risk;No limitations    Diet Recommendation Regular;Thin liquid;Dysphagia 3 (Mech soft)   Liquid Administration via: Cup;Straw Medication Administration: Whole meds with liquid Supervision: Patient able to self feed Compensations: Chin tuck;Slow rate;Small sips/bites Postural Changes: Seated upright at 90 degrees;Remain upright for at least 30 minutes after po intake    Other  Recommendations Oral Care Recommendations: Oral care BID    Recommendations for follow up therapy are one component of a multi-disciplinary discharge planning process, led by the attending physician.  Recommendations may be updated based on patient status, additional functional criteria and insurance authorization.  Follow up Recommendations No SLP follow up      Assistance Recommended at Discharge None  Functional Status Assessment Patient has had a recent decline in their functional status and demonstrates the ability to make significant improvements in function in a reasonable and predictable amount  of time.  Frequency and Duration   N/A         Prognosis   N/A     Swallow Study   General Date of Onset: 08/10/21 HPI: Patient is a 76y.o. female with PMH: chronic diastolic CHF, CAD s/p CABG, complete heart block s/p pacemaker, recently on dialysis but this has since been stopped and dialysis catheter removed 3 weeks ago. She presented 08/04/21  to the hospital with increasing SOB for 3 days prior with pleuritic chest pain. In ER, patient had significant edema of lower extremity and also erythema of both lower extremities. CXR on 6/25 showed Increased opacity in the left base may represent atelectasis or developing infiltrate/pneumonia and repeat CXR on 7/1 did not show any change in left lung base and no evidence of edema. She had Esophagram and MBS as an outpatient on 12/05/2020 with Esophagram showing stricturing of the distal esophagus but with 125mbarium tablet able to pass through the GE junction and MBS showing trace laryngeal penetration of thin liquids but no aspiration and chin tuck posture preventing penetration. Type of Study: Bedside Swallow Evaluation Previous Swallow Assessment: MBS and  Esophagram 12/05/2020 Diet Prior to this Study: Regular;Thin liquids Temperature Spikes Noted: No Respiratory Status: Room air History of Recent Intubation: No Behavior/Cognition: Alert;Cooperative;Pleasant mood Oral Cavity Assessment: Within Functional Limits Oral Care Completed by SLP: No Oral Cavity - Dentition: Dentures, top;Edentulous Vision: Functional for self-feeding Self-Feeding Abilities: Able to feed self Patient Positioning: Upright in bed Baseline Vocal Quality: Other (comment) ("see how hoarse my I am? ", SLP judged to be mild hoarseness) Volitional Cough: Strong Volitional Swallow: Able to elicit    Oral/Motor/Sensory Function Overall Oral Motor/Sensory Function: Within functional limits   Ice Chips     Thin Liquid Thin Liquid: Impaired Presentation: Straw;Self  Fed Pharyngeal  Phase Impairments: Throat Clearing - Delayed    Nectar Thick     Honey Thick     Puree Puree: Not tested   Solid     Solid: Not tested     Sonia Baller, MA, CCC-SLP Speech Therapy

## 2021-08-10 NOTE — TOC Progression Note (Signed)
Transition of Care [facility]) - Progression Note    Patient Details  Name: BETZAIDA CREMEENS MRN: [id number] Date of Birth: 11-24-1945  Transition of Care [facility]) CM/SW Contact  Purcell Mouton, RN Phone Number: 08/10/2021, 11:01 AM  Clinical Narrative:     Spoke with pt and her son [name] concerning [place]. Pt use [facility] for [place]. Referral given to in house rep with [facility].  Expected Discharge Plan: Home/Self Care Barriers to Discharge: Continued Medical Work up  Expected Discharge Plan and Services Expected Discharge Plan: Home/Self Care   Discharge Planning Services: CM Consult   Living arrangements for the past 2 months: Single Family Home                                       Social Determinants of Health (SDOH) Interventions    Readmission Risk Interventions    01/10/2021    9:47 AM  Readmission Risk Prevention Plan  Transportation Screening Complete  PCP or Specialist Appt within 3-5 Days Complete  HRI or [place] Complete  Social Work Consult for [place] Planning/Counseling Complete  Palliative Care Screening Not Applicable  Medication Review [employer]) Complete

## 2021-08-10 NOTE — Progress Notes (Signed)
,  Progress Note  Patient Name: [name] Date of Encounter: 08/10/2021  [facility] Cardiologist: Sanda Lydiann Bonifas, MD    Patient Profile     [name] is a 23F with CAD s/p CABG, chronic systolic and diastolic heart failure, morbid obesity, untreated OSA, DM, CHB s/p CRT-P, PAF, adrenal insufficiency, hypertension, CKD 4 admitted with acute on chronic diastolic heart failure and cellulitis.  Subjective   Quite short of breath No chest pain   Inpatient Medications    Scheduled Meds:  allopurinol  100 mg Oral q morning   aspirin EC  81 mg Oral q morning   clopidogrel  75 mg Oral QHS   cyanocobalamin  1,000 mcg Intramuscular Daily   cycloSPORINE  1 drop Both Eyes BID   folic acid  1 mg Oral Daily   furosemide  80 mg Oral BID   heparin injection (subcutaneous)  5,000 Units Subcutaneous Q8H   insulin aspart  0-6 Units Subcutaneous TID WC   isosorbide mononitrate  90 mg Oral Daily   methylPREDNISolone (SOLU-MEDROL) injection  40 mg Intravenous Q24H   metoprolol succinate  25 mg Oral Daily   mometasone-formoterol  2 puff Inhalation BID   nystatin   Topical TID   rosuvastatin  40 mg Oral QPM   Continuous Infusions:  sodium chloride Stopped (08/05/21 1207)   PRN Meds: sodium chloride, acetaminophen **OR** acetaminophen, albuterol, cetaphil, guaiFENesin, ondansetron (ZOFRAN) IV, mouth rinse, polyvinyl alcohol, sodium chloride   Vital Signs    Vitals:   08/09/21 1947 08/09/21 1952 08/10/21 0516 08/10/21 0517  BP:  (!) 158/86  (!) 166/93  Pulse:  (!) 110  (!) 109  Resp:  20  18  Temp:  98.3 F (36.8 C)  97.7 F (36.5 C)  TempSrc:  Oral  Oral  SpO2: 96% 96%  100%  Weight:   100.3 kg   Height:        Intake/Output Summary (Last 24 hours) at 08/10/2021 0748 Last data filed at 08/10/2021 0516 Gross per 24 hour  Intake 240 ml  Output 1350 ml  Net -1110 ml       08/10/2021    5:16 AM 08/09/2021    5:09 AM 08/08/2021    4:53 AM  Last 3 Weights  Weight (lbs)  221 lb 1.9 oz 220 lb 14.4 oz 219 lb 12.8 oz  Weight (kg) 100.3 kg 100.2 kg 99.7 kg      Telemetry    Atrial tach and P-synchronous/ AV  pacing   ECG    N/a - Personally Reviewed  Physical Exam     BP (!) 166/93 (BP Location: Left Wrist)   Pulse (!) 109   Temp 97.7 F (36.5 C) (Oral)   Resp 18   Ht '5\' 3"'$  (1.6 m)   Wt 100.3 kg   SpO2 100%   BMI 39.17 kg/m  Well developed and Morbidly obese in no acute distress HENT normal Neck supple with JVP> 8-10 Crackles 1/2 up B Rapid and irregular rate no  murmur Abd-soft with active BS No Clubbing cyanosis tr edema Skin-warm and dry A & Oriented  Grossly normal sensory and motor function     Labs    High Sensitivity Troponin:   Recent Labs  Lab 08/04/21 1551 08/04/21 1800  TROPONINIHS 30* 30*      Chemistry Recent Labs  Lab 08/04/21 1630 08/05/21 0747 08/08/21 0412 08/09/21 0402 08/10/21 0409  NA  --    < > 143 138 136  K  --    < > 3.2* 3.9 4.2  CL  --    < > 90* 88* 88*  CO2  --    < > 44* 40* 38*  GLUCOSE  --    < > 119* 165* 164*  BUN  --    < > 44* 43* 49*  CREATININE  --    < > 2.92* 2.69* 2.75*  CALCIUM  --    < > 9.2 8.8* 8.7*  MG  --   --   --   --  2.0  PROT 6.1*  --   --   --  5.9*  ALBUMIN 3.1*  --   --   --  2.8*  AST 14*  --   --   --  19  ALT 11  --   --   --  15  ALKPHOS 88  --   --   --  70  BILITOT 0.6  --   --   --  0.5  GFRNONAA  --    < > 16* 18* 17*  ANIONGAP  --    < > '9 10 10   '$ < > = values in this interval not displayed.     Lipids  Recent Labs  Lab 08/08/21 0412  CHOL 189  TRIG 97  HDL 51  LDLCALC 119*  CHOLHDL 3.7     Hematology Recent Labs  Lab 08/08/21 0412 08/09/21 0402 08/10/21 0409  WBC 5.6 5.0 4.9  RBC 2.87*  2.87* 2.85* 2.81*  HGB 9.0* 8.9* 8.9*  HCT 30.7* 29.7* 28.4*  MCV 107.0* 104.2* 101.1*  MCH 31.4 31.2 31.7  MCHC 29.3* 30.0 31.3  RDW 12.7 12.4 12.4  PLT 135* 130* 137*    Thyroid No results for input(s): "TSH", "FREET4" in the last 168  hours.  BNP Recent Labs  Lab 08/04/21 1430  BNP 321.7*     DDimer  Recent Labs  Lab 08/04/21 2013  DDIMER 2.34*      Radiology    No results found.  Cardiac Studies   ECHO: 08/05/2021  1. Technically difficult study. Left ventricular ejection fraction, by  estimation, is 45 to 50%. The left ventricle has mildly decreased  function. The left ventricle demonstrates regional wall motion  abnormalities (see scoring diagram/findings for description). Apical hypokinesis. There is mild left ventricular hypertrophy. Left ventricular diastolic parameters are indeterminate.   2. Right ventricular systolic function is normal. The right ventricular  size is normal. There is mildly elevated pulmonary artery systolic  pressure. The estimated right ventricular systolic pressure is 46.2 mmHg.   3. The mitral valve is degenerative. No evidence of mitral valve  regurgitation. Moderate mitral annular calcification.   4. The aortic valve was not well visualized. There is moderate  calcification of the aortic valve. Aortic valve regurgitation is trivial.  Mild aortic valve stenosis. Vmax 2.5 m/s, MG 13 mmHg, AVA 2.0 cm^2, DI 0.48   5. The inferior vena cava is dilated in size with <50% respiratory  variability, suggesting right atrial pressure of 15 mmHg.    CARDIAC CATH 03/11/2019        Ost 1st Diag lesion is 60% stenosed. Ost 2nd Diag lesion is 50% stenosed. Mid LAD lesion is 100% stenosed. Non-stenotic Ost Cx to Mid Cx lesion was previously treated. Prox RCA lesion is 100% stenosed. Non-stenotic Ost Ramus to Ramus lesion was previously treated. Origin to Prox Graft lesion before Ramus is 100% stenosed.   Findings:  Ao = 136/68 (95)  LV =  155/11 RA =  7 RV = 34/9 PA = 34/17 (24) PCW = 14 Fick cardiac output/index = 6.8/3.1 PVR = 1.5 WU FA sat = 96% PA sat = 65%, 75%, 69%   Assessment: 1. Stable coronary anatomy with no change from previous. SVG to OM system chronically  occluded with previous PCI of native LCX system. Remainder of grafts are widely patent 2. Very mild PAH with normal left-sided pressures. 3. Very difficult procedure given patient's size and fidgeting on cath table Diagnostic Dominance: Right     Assessment & Plan    #Acute on chronic systolic diastolic heart failure:  Status post CRT-P:[employer]  CAD status post CABG and PCI: Elevated troponin: Renal insufficiency Gd 4  PAF/Atrial tachycardia  Hypertension poorly controlled  Orthostatic LH  Anemia Macrocytic  B12 deficient  Volume overloaded,  will change lasix to IV, we have renal issues and would ask renal to see if Cr bumps with augmented IV diuresis  BP elevated will use hydralazine  Atrial tack with P-synchronous/ AV  pacing -- have asked pacemaker rep to reprogram device and hopefully that will slow the HR down, otherwise will need to increase rate control    Agree with B12  I wonder whether this might be contributing to her Orthostasis and also worry about amyloid   will seen a mylemoa panel      With Hypertension and increasing atrial rates will increase betablocker   she might benefit from adjunctive low dose CCB  there is by device interrogation, an increasing burden of atrial arrhythmia which will complicate her HFmrEF/HFpEF  Will add low dose hydralazine for BP control; check orthostatic VS on midodrine at home but not here          Signed, Virl Axe, MD  08/10/2021, 7:48 AM

## 2021-08-11 ENCOUNTER — Inpatient Hospital Stay (HOSPITAL_COMMUNITY): Payer: Medicare Other

## 2021-08-11 DIAGNOSIS — I471 Supraventricular tachycardia: Secondary | ICD-10-CM | POA: Diagnosis not present

## 2021-08-11 DIAGNOSIS — I5031 Acute diastolic (congestive) heart failure: Secondary | ICD-10-CM

## 2021-08-11 DIAGNOSIS — G4733 Obstructive sleep apnea (adult) (pediatric): Secondary | ICD-10-CM | POA: Diagnosis not present

## 2021-08-11 DIAGNOSIS — I5041 Acute combined systolic (congestive) and diastolic (congestive) heart failure: Secondary | ICD-10-CM | POA: Diagnosis not present

## 2021-08-11 DIAGNOSIS — Z950 Presence of cardiac pacemaker: Secondary | ICD-10-CM | POA: Diagnosis not present

## 2021-08-11 DIAGNOSIS — Z951 Presence of aortocoronary bypass graft: Secondary | ICD-10-CM | POA: Diagnosis not present

## 2021-08-11 DIAGNOSIS — I2510 Atherosclerotic heart disease of native coronary artery without angina pectoris: Secondary | ICD-10-CM | POA: Diagnosis not present

## 2021-08-11 LAB — CBC WITH DIFFERENTIAL/PLATELET
Abs Immature Granulocytes: 0.05 10*3/uL (ref 0.00–0.07)
Basophils Absolute: 0 10*3/uL (ref 0.0–0.1)
Basophils Relative: 0 %
Eosinophils Absolute: 0 10*3/uL (ref 0.0–0.5)
Eosinophils Relative: 0 %
HCT: 30.2 % — ABNORMAL LOW (ref 36.0–46.0)
Hemoglobin: 9.1 g/dL — ABNORMAL LOW (ref 12.0–15.0)
Immature Granulocytes: 1 %
Lymphocytes Relative: 10 %
Lymphs Abs: 0.5 10*3/uL — ABNORMAL LOW (ref 0.7–4.0)
MCH: 31.4 pg (ref 26.0–34.0)
MCHC: 30.1 g/dL (ref 30.0–36.0)
MCV: 104.1 fL — ABNORMAL HIGH (ref 80.0–100.0)
Monocytes Absolute: 0.2 10*3/uL (ref 0.1–1.0)
Monocytes Relative: 5 %
Neutro Abs: 4 10*3/uL (ref 1.7–7.7)
Neutrophils Relative %: 84 %
Platelets: 142 10*3/uL — ABNORMAL LOW (ref 150–400)
RBC: 2.9 MIL/uL — ABNORMAL LOW (ref 3.87–5.11)
RDW: 12.5 % (ref 11.5–15.5)
WBC: 4.8 10*3/uL (ref 4.0–10.5)
nRBC: 0 % (ref 0.0–0.2)

## 2021-08-11 LAB — COMPREHENSIVE METABOLIC PANEL
ALT: 19 U/L (ref 0–44)
AST: 24 U/L (ref 15–41)
Albumin: 3.1 g/dL — ABNORMAL LOW (ref 3.5–5.0)
Alkaline Phosphatase: 81 U/L (ref 38–126)
Anion gap: 10 (ref 5–15)
BUN: 63 mg/dL — ABNORMAL HIGH (ref 8–23)
CO2: 39 mmol/L — ABNORMAL HIGH (ref 22–32)
Calcium: 8.7 mg/dL — ABNORMAL LOW (ref 8.9–10.3)
Chloride: 88 mmol/L — ABNORMAL LOW (ref 98–111)
Creatinine, Ser: 2.95 mg/dL — ABNORMAL HIGH (ref 0.44–1.00)
GFR, Estimated: 16 mL/min — ABNORMAL LOW (ref >60)
Glucose, Bld: 187 mg/dL — ABNORMAL HIGH (ref 70–99)
Potassium: 4.2 mmol/L (ref 3.5–5.1)
Sodium: 137 mmol/L (ref 135–145)
Total Bilirubin: 0.5 mg/dL (ref 0.3–1.2)
Total Protein: 6.2 g/dL — ABNORMAL LOW (ref 6.5–8.1)

## 2021-08-11 LAB — GLUCOSE, CAPILLARY
Glucose-Capillary: 149 mg/dL — ABNORMAL HIGH (ref 70–99)
Glucose-Capillary: 113 mg/dL — ABNORMAL HIGH (ref 70–99)
Glucose-Capillary: 223 mg/dL — ABNORMAL HIGH (ref 70–99)
Glucose-Capillary: 212 mg/dL — ABNORMAL HIGH (ref 70–99)

## 2021-08-11 LAB — MAGNESIUM: Magnesium: 2.4 mg/dL (ref 1.7–2.4)

## 2021-08-11 LAB — PHOSPHORUS: Phosphorus: 5.1 mg/dL — ABNORMAL HIGH (ref 2.5–4.6)

## 2021-08-11 LAB — BRAIN NATRIURETIC PEPTIDE: B Natriuretic Peptide: 1471.9 pg/mL — ABNORMAL HIGH (ref 0.0–100.0)

## 2021-08-11 MED ORDER — SENNOSIDES-DOCUSATE SODIUM 8.6-50 MG PO TABS
1.0000 | ORAL_TABLET | Freq: Two times a day (BID) | ORAL | Status: DC
  Administered 2021-08-11 – 2021-08-15 (×9): 1 via ORAL
  Filled 2021-08-11 (×9): qty 1

## 2021-08-11 MED ORDER — POLYETHYLENE GLYCOL 3350 17 G PO PACK
17.0000 g | PACK | Freq: Two times a day (BID) | ORAL | Status: DC
  Administered 2021-08-11: 17 g via ORAL
  Filled 2021-08-11 (×5): qty 1

## 2021-08-11 MED ORDER — FUROSEMIDE 40 MG PO TABS
80.0000 mg | ORAL_TABLET | Freq: Every evening | ORAL | Status: DC
  Administered 2021-08-11 – 2021-08-13 (×3): 80 mg via ORAL
  Filled 2021-08-11 (×3): qty 2

## 2021-08-11 MED ORDER — HYDRALAZINE HCL 25 MG PO TABS
25.0000 mg | ORAL_TABLET | Freq: Three times a day (TID) | ORAL | Status: DC
  Administered 2021-08-11 – 2021-08-15 (×13): 25 mg via ORAL
  Filled 2021-08-11 (×13): qty 1

## 2021-08-11 MED ORDER — FUROSEMIDE 40 MG PO TABS
160.0000 mg | ORAL_TABLET | Freq: Every day | ORAL | Status: DC
  Administered 2021-08-12 – 2021-08-15 (×4): 160 mg via ORAL
  Filled 2021-08-11 (×4): qty 4

## 2021-08-11 NOTE — Plan of Care (Signed)
  Problem: Education: Goal: Knowledge of General Education information will improve Description: Including pain rating scale, medication(s)/side effects and non-pharmacologic comfort measures Outcome: Progressing   Problem: Health Behavior/Discharge Planning: Goal: Ability to manage health-related needs will improve Outcome: Progressing   Problem: Clinical Measurements: Goal: Ability to maintain clinical measurements within normal limits will improve Outcome: Progressing Goal: Will remain free from infection Outcome: Progressing Goal: Diagnostic test results will improve Outcome: Progressing Goal: Respiratory complications will improve Outcome: Progressing Goal: Cardiovascular complication will be avoided Outcome: Progressing   Problem: Activity: Goal: Risk for activity intolerance will decrease Outcome: Progressing   Problem: Nutrition: Goal: Adequate nutrition will be maintained Outcome: Progressing   Problem: Coping: Goal: Level of anxiety will decrease Outcome: Progressing   Problem: Elimination: Goal: Will not experience complications related to bowel motility Outcome: Progressing Goal: Will not experience complications related to urinary retention Outcome: Progressing   Problem: Pain Managment: Goal: General experience of comfort will improve Outcome: Progressing   Problem: Safety: Goal: Ability to remain free from injury will improve Outcome: Progressing   Problem: Skin Integrity: Goal: Risk for impaired skin integrity will decrease Outcome: Progressing   Problem: Education: Goal: Ability to describe self-care measures that may prevent or decrease complications (Diabetes Survival Skills Education) will improve Outcome: Progressing Goal: Individualized Educational Video(s) Outcome: Progressing   Problem: Coping: Goal: Ability to adjust to condition or change in health will improve Outcome: Progressing   Problem: Fluid Volume: Goal: Ability to  maintain a balanced intake and output will improve Outcome: Progressing   Problem: Health Behavior/Discharge Planning: Goal: Ability to identify and utilize available resources and services will improve Outcome: Progressing Goal: Ability to manage health-related needs will improve Outcome: Progressing   Problem: Metabolic: Goal: Ability to maintain appropriate glucose levels will improve Outcome: Progressing   Problem: Nutritional: Goal: Maintenance of adequate nutrition will improve Outcome: Progressing Goal: Progress toward achieving an optimal weight will improve Outcome: Progressing   Problem: Skin Integrity: Goal: Risk for impaired skin integrity will decrease Outcome: Progressing   Problem: Tissue Perfusion: Goal: Adequacy of tissue perfusion will improve Outcome: Progressing   Problem: Education: Goal: Ability to demonstrate management of disease process will improve Outcome: Progressing Goal: Ability to verbalize understanding of medication therapies will improve Outcome: Progressing Goal: Individualized Educational Video(s) Outcome: Progressing   Problem: Activity: Goal: Capacity to carry out activities will improve Outcome: Progressing   Problem: Cardiac: Goal: Ability to achieve and maintain adequate cardiopulmonary perfusion will improve Outcome: Progressing   

## 2021-08-11 NOTE — Consult Note (Signed)
Renal Service Consult Note [facility]  [name] 08/11/2021 Sol Blazing, MD Requesting Physician: Dr. Alfredia Ferguson  Reason for Consult: Renal failure HPI: The patient is a 76 y.o. year-old w/ hx of CRT-P, CAD, CHF, fatty liver, Gout, HL, HTN, IBS, NICM, obestiy, OSA, sp PPM, hx CVA and DM2 who presented to ED w/ 3 days of SOB/ DOE and worsening LE edema. In ED found to have bilat LE cellulitis and edema w/ some amount of fluid by CXR. Pt admitted for diuresis and IV abx. Has had a good diuresis w/ 12.8 L UOP over last 7 days (5.2 L in). Creat is 2.9 today. We are asked to see for renal failure.   Pt was admitted late Feb 2023 w/ L hip fracture which was complicated by AKI on CKD 3b. Pt required iHD then CRRT then iHD again. She was dc'd 04/21/21 and took outpatient HD via [facility] through march, all of April and all or most of May. She then showed improved creatinine into the 2's and was taken off of HD and her [facility] was removed about 4 wks ago (early June).  Pt is now admitted w/ vol overload, creat 3.3 here improved down to 2.9 today sp 7 days of diuresis (-9 L). She is feeling much better in terms of her leg swelling and breathing / DOE.   ROS - denies CP, no joint pain, no HA, no blurry vision, no rash, no diarrhea, no nausea/ vomiting, no dysuria, no difficulty voiding   Past Medical History  Past Medical History:  Diagnosis Date   AICD (automatic cardioverter/defibrillator) present    downgraded to CRT-P in 2014   Anemia    Arthritis    Asthma    Atrial fibrillation ([place]) 10/24/2015   Questionable history of A Fib/A Flutter. On device interrogation on 9/7, showed 0.2min of A Fib/A Flutter with 1% burden.  Device check in Jan 2018 showed no sustained AF (runs of less than 30 seconds). No anticoagulation indicated.   CAD (coronary artery disease)    a. 10/09/16 LHC: SVG->LAD patent, SVG->Diag patent, SVG->RCA patent, SVG->LCx occluded. EF 60%, b. 10/31/16 LHC DES to AV  groove Circ, DES to intermed branch   CHF (congestive heart failure) (HCC)    Chronic lower back pain    Fatty liver disease, nonalcoholic    GERD (gastroesophageal reflux disease)    Gout    H/O hiatal hernia    High cholesterol    Hyperplastic colon polyp    Hypertension    IBS (irritable bowel syndrome)    Internal hemorrhoids    INTERNAL HEMORRHOIDS WITHOUT MENTION COMP 04/12/2007   Qualifier: Diagnosis of  By: Olevia Perches MD, Dora M    Nonischemic cardiomyopathy ([place]) 11/22/2011   Pt responded to BiV ICD- last EF 55-60% Sept 2017   Obesity    OSA (obstructive sleep apnea)    "can't tolerate a mask", wears 2L nocturnal O2   Presence of permanent cardiac pacemaker    11/02/12 [employer] V273 INTUA PPM   Stroke [facility]) 09/2020   Type II diabetes mellitus ([place])    Past Surgical History  Past Surgical History:  Procedure Laterality Date   ABDOMINAL ULTRASOUND  12/01/2011   Peripelvic cysts- #1- 2.4x1.9x2.3cm, #2-1.2x0.9x1.2cm   ANKLE FRACTURE SURGERY Right    "had rod put in"   ANTERIOR CERVICAL DECOMP/DISCECTOMY FUSION     APPENDECTOMY     BACK SURGERY     BIOPSY  12/29/2017   Procedure: BIOPSY;  Surgeon: [name], MD;  Location: [facility];  Service: Endoscopy;;   BIV ICD INSERTION CRT-D  2001?   BIV PACEMAKER GENERATOR CHANGE OUT N/A 11/02/2012   Procedure: BIV PACEMAKER GENERATOR CHANGE OUT;  Surgeon: Sanda Klein, MD;  Location: [place] CATH LAB;  Service: Cardiovascular;  Laterality: N/A;   CARDIAC CATHETERIZATION  05/17/1999   No significant coronary obstructive disease w/ mild 20% luminal irregularity of the first diag branch of the LAD   CARDIAC CATHETERIZATION  07/08/2002   No significant CAD, moderately depressed LV systolic function   CARDIAC CATHETERIZATION Bilateral 04/26/2007   Normal findings, recommend medical therapy   CARDIAC CATHETERIZATION  02/18/2008   Moderate CAD, would benefit from having a functional study, recommend continue medical therapy    CARDIAC CATHETERIZATION  07/23/2012   Medical therapy   CARDIAC CATHETERIZATION N/A 11/24/2014   Procedure: Left Heart Cath and Coronary Angiography;  Surgeon: Troy Sine, MD;  Location: [place] CV LAB;  Service: Cardiovascular;  Laterality: N/A;   CARDIAC CATHETERIZATION  11/27/2014   Procedure: Intravascular Pressure Wire/FFR Study;  Surgeon: Peter M Martinique, MD;  Location: [place] CV LAB;  Service: Cardiovascular;;   CARDIAC CATHETERIZATION  10/09/2016   CHOLECYSTECTOMY N/A 04/09/2018   Procedure: LAPAROSCOPIC CHOLECYSTECTOMY;  Surgeon: Greer Pickerel, MD;  Location: [facility];  Service: General;  Laterality: N/A;   COLONOSCOPY WITH PROPOFOL N/A 12/29/2017   Procedure: COLONOSCOPY WITH PROPOFOL;  Surgeon: [name], MD;  Location: [facility];  Service: Endoscopy;  Laterality: N/A;   CORONARY ANGIOGRAM  2010   CORONARY ARTERY BYPASS GRAFT N/A 11/29/2014   Procedure: CORONARY ARTERY BYPASS GRAFTING x 5 (LIMA-LAD, SVG-D, SVG-OM1-OM2, SVG-PD);  Surgeon: Melrose Nakayama, MD;  Location: [place];  Service: Open Heart Surgery;  Laterality: N/A;   CORONARY STENT INTERVENTION N/A 10/31/2016   Procedure: CORONARY STENT INTERVENTION;  Surgeon: Burnell Blanks, MD;  Location: [place] CV LAB;  Service: Cardiovascular;  Laterality: N/A;   ESOPHAGOGASTRODUODENOSCOPY (EGD) WITH PROPOFOL N/A 12/29/2017   Procedure: ESOPHAGOGASTRODUODENOSCOPY (EGD) WITH PROPOFOL;  Surgeon: [name], MD;  Location: [facility];  Service: Endoscopy;  Laterality: N/A;   FRACTURE SURGERY     INSERT / REPLACE / REMOVE PACEMAKER  1999   INTRAMEDULLARY (IM) NAIL INTERTROCHANTERIC Left 04/04/2021   Procedure: INTRAMEDULLARY (IM) NAIL INTERTROCHANTRIC;  Surgeon: Vanetta Mulders, MD;  Location: [place];  Service: Orthopedics;  Laterality: Left;   IR FLUORO GUIDE CV LINE LEFT  04/11/2021   IR US GUIDE VASC ACCESS LEFT  04/11/2021   KNEE ARTHROSCOPY Bilateral    LEFT HEART CATH AND CORS/GRAFTS  ANGIOGRAPHY N/A 12/09/2017   Procedure: LEFT HEART CATH AND CORS/GRAFTS ANGIOGRAPHY;  Surgeon: Troy Sine, MD;  Location: [place] CV LAB;  Service: Cardiovascular;  Laterality: N/A;   LEFT HEART CATHETERIZATION WITH CORONARY ANGIOGRAM N/A 07/23/2012   Procedure: LEFT HEART CATHETERIZATION WITH CORONARY ANGIOGRAM;  Surgeon: Leonie Man, MD;  Location: [facility] CATH LAB;  Service: Cardiovascular;  Laterality: N/A;   LEXISCAN MYOVIEW  11/14/2011   Mild-moderate defect seen in Mid Inferolateral and Mid Anterolateral regions-consistant w/ infarct/scar. No significant ischemia demonstrated.   POLYPECTOMY  12/29/2017   Procedure: POLYPECTOMY;  Surgeon: [name], MD;  Location: [facility];  Service: Endoscopy;;   PPM GENERATOR CHANGEOUT N/A 12/31/2020   Procedure: PPM GENERATOR CHANGEOUT;  Surgeon: Sanda Klein, MD;  Location: [place] CV LAB;  Service: Cardiovascular;  Laterality: N/A;   RIGHT/LEFT HEART CATH AND CORONARY ANGIOGRAPHY N/A 10/09/2016  Procedure: RIGHT/LEFT HEART CATH AND CORONARY ANGIOGRAPHY;  Surgeon: Jolaine Artist, MD;  Location: [place] CV LAB;  Service: Cardiovascular;  Laterality: N/A;   RIGHT/LEFT HEART CATH AND CORONARY/GRAFT ANGIOGRAPHY N/A 03/11/2019   Procedure: RIGHT/LEFT HEART CATH AND CORONARY/GRAFT ANGIOGRAPHY;  Surgeon: Jolaine Artist, MD;  Location: [place] CV LAB;  Service: Cardiovascular;  Laterality: N/A;   TEE WITHOUT CARDIOVERSION N/A 11/29/2014   Procedure: TRANSESOPHAGEAL ECHOCARDIOGRAM (TEE);  Surgeon: Melrose Nakayama, MD;  Location: [place];  Service: Open Heart Surgery;  Laterality: N/A;   TRANSCAROTID ARTERY REVASCULARIZATION  Right 10/01/2020   Procedure: RIGHT TRANSCAROTID ARTERY REVASCULARIZATION;  Surgeon: [name], MD;  Location: [facility] OR;  Service: Vascular;  Laterality: Right;   TRANSTHORACIC ECHOCARDIOGRAM  07/23/2012   EF 55-60%, normal-mild   TUBAL LIGATION     ULTRASOUND GUIDANCE FOR VASCULAR  ACCESS Left 10/01/2020   Procedure: ULTRASOUND GUIDANCE FOR VASCULAR ACCESS;  Surgeon: [name], MD;  Location: [place];  Service: Vascular;  Laterality: Left;   Family History  Family History  Problem Relation Age of Onset   Breast cancer Mother    Diabetes Mother    Heart disease Maternal Grandmother    Kidney disease Maternal Grandmother    Diabetes Maternal Grandmother    Glaucoma Maternal Aunt    Heart disease Maternal Aunt    Asthma Sister    Colon cancer Neg Hx    Stomach cancer Neg Hx    Pancreatic cancer Neg Hx    Social History  reports that she quit smoking about 53 years ago. Her smoking use included cigarettes. She has a 0.75 pack-year smoking history. She has never used smokeless tobacco. She reports that she does not drink alcohol and does not use drugs. Allergies  Allergies  Allergen Reactions   Ivp Dye [Iodinated Contrast Media] Shortness Of Breath    No reaction to PO contrast with non-ionic dye.06-25-2014/rsm   Shellfish Allergy Anaphylaxis   Sulfa Antibiotics Shortness Of Breath   Iodine Hives   Atorvastatin Other (See Comments)    Pt states "causes bilateral leg pain/cramps."   Benadryl [Diphenhydramine] Other (See Comments)    "THIS DRIVES ME CRAZY AND MAKES ME FEEL LIKE I AM DYING"    Colchicine Nausea And Vomiting   Doxycycline Other (See Comments)    Unknown reaction, per patient   Uloric [Febuxostat] Other (See Comments)    Unknown reaction   Cephalexin Itching and Rash   Zithromax [Azithromycin] Rash   Home medications Prior to Admission medications   Medication Sig Start Date End Date Taking? Authorizing Provider  acetaminophen (TYLENOL) 500 MG tablet Take 1,000 mg by mouth 3 (three) times daily as needed (for pain or headaches).   Yes [provider]  albuterol (PROVENTIL HFA;VENTOLIN HFA) 108 (90 Base) MCG/ACT inhaler Inhale 2 puffs into the lungs every 6 (six) hours as needed for wheezing or shortness of breath. 05/02/18  Yes  Fuller Plan A, MD  allopurinol (ZYLOPRIM) 100 MG tablet Take 1 tablet (100 mg total) by mouth daily. Patient taking differently: Take 100 mg by mouth every morning. 03/14/21  Yes Croitoru, [name], MD  aspirin EC 81 MG tablet Take 1 tablet (81 mg total) by mouth daily. Patient taking differently: Take 81 mg by mouth every morning. 03/27/16  Yes Kilroy, Luke K, PA-C  budesonide-formoterol (SYMBICORT) 160-4.5 MCG/ACT inhaler Inhale 2 puffs into the lungs 2 (two) times daily.   Yes [provider]  clopidogrel (PLAVIX) 75 MG tablet Take  1 tablet (75 mg total) by mouth daily. Patient taking differently: Take 75 mg by mouth every morning. 02/28/21  Yes Croitoru, [name], MD  cycloSPORINE (RESTASIS) 0.05 % ophthalmic emulsion Place 1 drop into both eyes 2 (two) times daily.   Yes [provider]  docusate sodium (COLACE) 100 MG capsule Take 100 mg by mouth at bedtime.   Yes [provider]  EPINEPHrine 0.3 mg/0.3 mL IJ SOAJ injection Inject 0.3 mg into the muscle once as needed for anaphylaxis (severe allergic reaction).   Yes [provider]  furosemide (LASIX) 40 MG tablet Take 80 mg by mouth 2 (two) times daily with breakfast and lunch. 07/17/21  Yes [provider]  hydrOXYzine (ATARAX) 25 MG tablet Take 25 mg by mouth 2 (two) times daily as needed for itching. 06/07/21  Yes [provider]  isosorbide mononitrate (IMDUR) 30 MG 24 hr tablet TAKE 3 TABLETS BY MOUTH  DAILY Patient taking differently: Take 90 mg by mouth every morning. 05/15/21  Yes Croitoru, [name], MD  midodrine (PROAMATINE) 10 MG tablet Take 1 tablet (10 mg total) by mouth 3 (three) times daily with meals. 04/28/21  Yes Gherghe, Vella Redhead, MD  nitroGLYCERIN (NITROSTAT) 0.4 MG SL tablet Place 1 tablet (0.4 mg total) under the tongue every 5 (five) minutes as needed for chest pain. 11/13/16 03/18/22 Yes Kilroy, Luke K, PA-C  ondansetron (ZOFRAN) 4 MG tablet Take 4 mg by mouth daily as needed for  nausea or vomiting. 07/24/21  Yes [provider]  OXYGEN Inhale 2-3 L/min into the lungs continuous.   Yes [provider]  oxymetazoline (AFRIN) 0.05 % nasal spray Place 1 spray into both nostrils 2 (two) times daily as needed for congestion.   Yes [provider]  pantoprazole (PROTONIX) 40 MG tablet Take 1 tablet (40 mg total) by mouth 2 (two) times daily. 07/08/19  Yes Nandigam, Venia Minks, MD  polyethylene glycol (MIRALAX / GLYCOLAX) 17 g packet Take 17 g by mouth daily as needed (constipation).   Yes [provider]  senna-docusate (SENOKOT-S) 8.6-50 MG tablet Take 1 tablet by mouth 2 (two) times daily. 04/28/21  Yes Gherghe, Vella Redhead, MD  sucralfate (CARAFATE) 1 g tablet Take 1 tablet (1 g total) by mouth 4 (four) times daily -  with meals and at bedtime. Patient taking differently: Take 2 g by mouth 2 (two) times daily. 04/28/21  Yes Gherghe, Vella Redhead, MD  traMADol (ULTRAM) 50 MG tablet Take 50 mg by mouth daily as needed (pain). 06/07/21  Yes [provider]  ACCU-CHEK AVIVA PLUS test strip Check blood sugar twice daily 10/19/19   [provider]  Accu-Chek Softclix Lancets lancets Check blood sugar twice daily 08/10/19   [provider]  albuterol (PROVENTIL) (2.5 MG/3ML) 0.083% nebulizer solution Take 3 mLs (2.5 mg total) by nebulization every 6 (six) hours as needed for wheezing or shortness of breath. Patient not taking: Reported on 08/05/2021 05/02/18   Norval Morton, MD  Alcohol Swabs (B-D SINGLE USE SWABS REGULAR) PADS  08/10/19   [provider]  clindamycin (CLEOCIN) 150 MG capsule Take 450 mg by mouth every 8 (eight) hours. Patient not taking: Reported on 08/05/2021 07/26/21   [provider]  empagliflozin (JARDIANCE) 10 MG TABS tablet Take 10 mg by mouth daily before breakfast. Patient not taking: Reported on 06/11/2021 06/30/19   Bensimhon, Shaune Pascal, MD  HYDROcodone-acetaminophen (NORCO/VICODIN) 5-325 MG  tablet Take 1-2 tablets by mouth every 6 (six) hours  as needed for moderate pain. Patient not taking: Reported on 08/05/2021 04/28/21   Caren Griffins, MD  Lidocaine 0.5 % AERO Place 1 spray rectally every 6 (six) hours as needed (hemorrhoids). Preparation H rapid relief lidocaine spray    [provider]  linaclotide (LINZESS) 145 MCG CAPS capsule Take 145 mcg by mouth daily before breakfast. Patient not taking: Reported on 08/05/2021    [provider]  rosuvastatin (CRESTOR) 20 MG tablet Take 1 tablet (20 mg total) by mouth every evening. NEED OV. Patient not taking: Reported on 08/05/2021 06/25/21   Croitoru, Dani Gobble, MD     Vitals:   08/11/21 0500 08/11/21 0506 08/11/21 0827 08/11/21 1249  BP:  (!) 160/70  (!) 141/86  Pulse:  79  81  Resp:  18  20  Temp:  97.9 F (36.6 C)  97.7 F (36.5 C)  TempSrc:  Oral  Oral  SpO2:  100% 98% 97%  Weight: 101 kg     Height:       Exam Gen alert, no distress No rash, cyanosis or gangrene Sclera anicteric, throat clear  No jvd or bruits Chest clear bilat to bases, no rales/ wheezing RRR no MRG Abd soft ntnd no mass or ascites +bs GU normal MS no joint effusions or deformity Ext no LE or UE edema, no wounds or ulcers Neuro is alert, Ox 3 , nf      Home meds include - allopurinol, aspririn, budesnoide-formoterol, clopidogrel, isosorbide mononitrate, midodrine 10 tid, 2-3L [place] O2, pantoprazole, sucralfate 2g bid, tramadol, albuterol, empagliflozin, hydrocodone-aceta prn, rosuvastatin, prns/ vits/ supps    Assessment/ Plan: CKD IV - b/l creat 2.7- 3.2, eGFR 14- 16. Pt was on HD from feb- early June 2023, now admitted w/ vol overload and pt has been diuresed w/ improved edema/ DOE. Renal function remains stable in the non-dialysis range. Looks like she was taking 80 bid of lasix at home. Consider increase to 145m am and 80 mg pm upon discharge. [facility]. No further suggestions. Will  sign off.  Cellulitis - on IV unasyn x 1 week, per pmd COPD Obesity DM2  HTN - cont meds      RKelly Splinter MD 08/11/2021, 4:57 PM Recent Labs  Lab 08/10/21 0409 08/11/21 0421  HGB 8.9* 9.1*  ALBUMIN 2.8* 3.1*  CALCIUM 8.7* 8.7*  PHOS 4.5 5.1*  CREATININE 2.75* 2.95*  K 4.2 4.2

## 2021-08-11 NOTE — Progress Notes (Signed)
,  Progress Note  Patient Name: [name] Date of Encounter: 08/11/2021  [facility] Cardiologist: Alicia Cathy Ropp, MD    Patient Profile     [name] is a 11F with CAD s/p CABG, chronic systolic and diastolic heart failure,O2 dependent COPD  morbid obesity, untreated OSA, DM, CHB s/p CRT-P, PAF, adrenal insufficiency, hypertension, CKD 4 admitted with acute on chronic diastolic heart failure and cellulitis. Admitted 3/23 w fall and course complicated by acute renal failure prompting dialysis Underlying atrial tachycardia and device reprogrammed VDD(prior) to upper tracking rate of 90   Subjective   Much improved dyspnea this morning has been walking in the room.  Inpatient Medications    Scheduled Meds:  allopurinol  100 mg Oral q morning   aspirin EC  81 mg Oral q morning   clopidogrel  75 mg Oral QHS   cycloSPORINE  1 drop Both Eyes BID   folic acid  1 mg Oral Daily   furosemide  80 mg Oral BID   heparin injection (subcutaneous)  5,000 Units Subcutaneous Q8H   hydrALAZINE  10 mg Oral Q8H   insulin aspart  0-6 Units Subcutaneous TID WC   isosorbide mononitrate  90 mg Oral Daily   methylPREDNISolone (SOLU-MEDROL) injection  40 mg Intravenous Q24H   metoprolol succinate  50 mg Oral Daily   mometasone-formoterol  2 puff Inhalation BID   nystatin   Topical TID   pantoprazole  40 mg Oral BID   polyethylene glycol  17 g Oral BID   rosuvastatin  40 mg Oral QPM   senna-docusate  1 tablet Oral BID   Continuous Infusions:  sodium chloride Stopped (08/05/21 1207)   PRN Meds: sodium chloride, acetaminophen **OR** acetaminophen, albuterol, cetaphil, guaiFENesin, ondansetron (ZOFRAN) IV, mouth rinse, polyvinyl alcohol, sodium chloride   Vital Signs    Vitals:   08/10/21 2051 08/11/21 0500 08/11/21 0506 08/11/21 0827  BP:   (!) 160/70   Pulse:   79   Resp:   18   Temp:   97.9 F (36.6 C)   TempSrc:   Oral   SpO2: 97%  100% 98%  Weight:  101 kg    Height:         Intake/Output Summary (Last 24 hours) at 08/11/2021 0835 Last data filed at 08/11/2021 0700 Gross per 24 hour  Intake 360 ml  Output 500 ml  Net -140 ml       08/11/2021    5:00 AM 08/10/2021    5:16 AM 08/09/2021    5:09 AM  Last 3 Weights  Weight (lbs) 222 lb 10.6 oz 221 lb 1.9 oz 220 lb 14.4 oz  Weight (kg) 101 kg 100.3 kg 100.2 kg      Telemetry    Atrial tach and P-synchronous/ AV  pacing heart rates in the 80s  ECG    N/a - Personally Reviewed  Physical Exam     BP (!) 160/70 (BP Location: Right Arm)   Pulse 79   Temp 97.9 F (36.6 C) (Oral)   Resp 18   Ht '5\' 3"'$  (1.6 m)   Wt 101 kg   SpO2 98%   BMI 39.44 kg/m  Well developed and Morbidly obese in no acute distress HENT normal Neck supple with JVP-  8-10 Clear Regular rate and rhythm, no murmurs or gallops Abd-soft with active BS No Clubbing cyanosis 2+edema Skin-warm and dry A & Oriented  Grossly normal sensory and motor function  Labs    High Sensitivity Troponin:   Recent Labs  Lab 08/04/21 1551 08/04/21 1800  TROPONINIHS 30* 30*      Chemistry Recent Labs  Lab 08/04/21 1630 08/05/21 0747 08/09/21 0402 08/10/21 0409 08/11/21 0421  NA  --    < > 138 136 137  K  --    < > 3.9 4.2 4.2  CL  --    < > 88* 88* 88*  CO2  --    < > 40* 38* 39*  GLUCOSE  --    < > 165* 164* 187*  BUN  --    < > 43* 49* 63*  CREATININE  --    < > 2.69* 2.75* 2.95*  CALCIUM  --    < > 8.8* 8.7* 8.7*  MG  --   --   --  2.0 2.4  PROT 6.1*  --   --  5.9* 6.2*  ALBUMIN 3.1*  --   --  2.8* 3.1*  AST 14*  --   --  19 24  ALT 11  --   --  15 19  ALKPHOS 88  --   --  70 81  BILITOT 0.6  --   --  0.5 0.5  GFRNONAA  --    < > 18* 17* 16*  ANIONGAP  --    < > '10 10 10   '$ < > = values in this interval not displayed.     Lipids  Recent Labs  Lab 08/08/21 0412  CHOL 189  TRIG 97  HDL 51  LDLCALC 119*  CHOLHDL 3.7     Hematology Recent Labs  Lab 08/09/21 0402 08/10/21 0409 08/11/21 0421  WBC 5.0  4.9 4.8  RBC 2.85* 2.81* 2.90*  HGB 8.9* 8.9* 9.1*  HCT 29.7* 28.4* 30.2*  MCV 104.2* 101.1* 104.1*  MCH 31.2 31.7 31.4  MCHC 30.0 31.3 30.1  RDW 12.4 12.4 12.5  PLT 130* 137* 142*    Thyroid No results for input(s): "TSH", "FREET4" in the last 168 hours.  BNP Recent Labs  Lab 08/04/21 1430 08/11/21 0421  BNP 321.7* 1,471.9*     DDimer  Recent Labs  Lab 08/04/21 2013  DDIMER 2.34*      Radiology    DG CHEST PORT 1 VIEW  Result Date: 08/10/2021 CLINICAL DATA:  Congestive heart failure.  Shortness of breath. EXAM: PORTABLE CHEST 1 VIEW COMPARISON:  Radiographs 08/04/2021, 06/11/2021 and 06/10/2021. CT 08/18/2013. FINDINGS: 0536 hours. Right subclavian AICD leads appear unchanged. There is stable mild cardiac enlargement status post median sternotomy and CABG. Small left pleural effusion or thickening and mild left lower lobe airspace disease are unchanged from the recent prior study, appearing mildly increased from May. The right lung is clear. There is no edema or pneumothorax. The bones appear unremarkable. IMPRESSION: No change in pleuroparenchymal opacities at the left lung base compared with prior study of 1 week ago. No evidence of edema. Electronically Signed   By: Richardean Sale M.D.   On: 08/10/2021 08:51    Cardiac Studies   ECHO: 08/05/2021  1. Technically difficult study. Left ventricular ejection fraction, by  estimation, is 45 to 50%. The left ventricle has mildly decreased  function. The left ventricle demonstrates regional wall motion  abnormalities (see scoring diagram/findings for description). Apical hypokinesis. There is mild left ventricular hypertrophy. Left ventricular diastolic parameters are indeterminate.   2. Right ventricular systolic function is normal. The right ventricular  size is normal.  There is mildly elevated pulmonary artery systolic  pressure. The estimated right ventricular systolic pressure is 76.1 mmHg.   3. The mitral valve is  degenerative. No evidence of mitral valve  regurgitation. Moderate mitral annular calcification.   4. The aortic valve was not well visualized. There is moderate  calcification of the aortic valve. Aortic valve regurgitation is trivial.  Mild aortic valve stenosis. Vmax 2.5 m/s, MG 13 mmHg, AVA 2.0 cm^2, DI 0.48   5. The inferior vena cava is dilated in size with <50% respiratory  variability, suggesting right atrial pressure of 15 mmHg.    CARDIAC CATH 03/11/2019        Ost 1st Diag lesion is 60% stenosed. Ost 2nd Diag lesion is 50% stenosed. Mid LAD lesion is 100% stenosed. Non-stenotic Ost Cx to Mid Cx lesion was previously treated. Prox RCA lesion is 100% stenosed. Non-stenotic Ost Ramus to Ramus lesion was previously treated. Origin to Prox Graft lesion before Ramus is 100% stenosed.   Findings:   Ao = 136/68 (95)  LV =  155/11 RA =  7 RV = 34/9 PA = 34/17 (24) PCW = 14 Fick cardiac output/index = 6.8/3.1 PVR = 1.5 WU FA sat = 96% PA sat = 65%, 75%, 69%   Assessment: 1. Stable coronary anatomy with no change from previous. SVG to OM system chronically occluded with previous PCI of native LCX system. Remainder of grafts are widely patent 2. Very mild PAH with normal left-sided pressures. 3. Very difficult procedure given patient's size and fidgeting on cath table Diagnostic Dominance: Right     Assessment & Plan    #Acute on chronic systolic diastolic heart failure:  Status post CRT-P:[employer]  CAD status post CABG and PCI: Elevated troponin: Renal insufficiency Gd 4  PAF/Atrial tachycardia  Hypertension poorly controlled  Orthostatic LH  Anemia Macrocytic  B12 deficient  With Cr increasing have talked to renal; they will see; as noted above, was on dialysis 3/23 With her atrial arrhythmia, have reprogrammed her device with a max tracking rate of 90, heart rates are now significantly better and I think this is major contribution to her improved  status. Continue diuresis. With elevated blood pressure we will increase her hydralazine; interestingly, no orthostatic blood pressure changes noted yesterday.  So would not resume midodrine            Signed, Virl Axe, MD  08/11/2021, 8:35 AM

## 2021-08-11 NOTE — Progress Notes (Signed)
PROGRESS NOTE    [name]  IHK:[id number] DOB: 1946/02/05 DOA: 08/04/2021 PCP: Lujean Amel, MD   Brief Narrative:   [name] is a 76 y.o. female with a history of chronic diastolic CHF, CAD s/p CABG, CHB s/p pacemaker who was recently on dialysis for ATN and has been off dialysis and dialysis catheter removed about 3 weeks ago, recently treated for lower extremity cellulitis who presented to the ED 6/25 with worsening dyspnea, lower extremity edema and erythema. BNP was elevated, CXR nonspecific left base opacity favored to represent atelectasis. LVEF 45-50% by echo. IV lasix was started and the patient was admitted with cardiology consultation for acute CHF. D-dimer was positive, though suspicion for PE is low. On 6/29, respiratory status had improved and diuresis is transitioned to oral lasix per cardiology but given further decompensation she is transition back to IV Lasix on 08/10/2021. She has developed acute gout in the right foot, however, which severely limits her mobility for which steroids are started and now improving.  She now complains of some difficulty swallowing and feels like "phlegm is stuck in my throat".  SLP evaluated and recommending regular diet but if necessary placing on a dysphagia 3 diet with thin liquids.   Given her volume overload she was changed back to IV Lasix and hydralazine was given for her elevated blood pressure.  Given that she had atrial tachycardia with asynchronous AV pacing the cardiologist has asked the pacemaker rep to reprogram her device to try for heart rate down.  Cardiology is concern for amyloid and ordered a myeloma panel.  Cardiology is also recommended checking orthostatic vital signs and low-dose hydralazine for blood pressure control.  Patient's dyspnea is improving but because her renal function worsened the cardiology team to discuss with nephrology for further evaluation and will see the patient later this evening and feels that  the patient has been adequately diuresed and are recommending changing the diuretics back to 160 mg of Lasix p.o. in the morning and 80 mg p.o. nightly  Assessment and Plan:  Acute on chronic hypoxic respiratory failure due to acute on chronic combined HFrEF:  -Pt is s/p CRT-P with improvement in LVEF with pacing and GDMT, now 45-50% -Continue lasix, transition '80mg'$  IV > PO BID the day before yesterday per cardiology but cardiology will be changed back to IV diuresis however it was never actually done and she is still on p.o. diuresis and creatinine still worsened but nephrology has now made some recommendations and recommending 160 mg of Lasix in the morning and 80 mg of p.o. Lasix in the evening., monitoring daily weights (down 101.1 > 99.2kg which is likely EDW).  -Weaned back to home 2L O2 however she continues to complain of some dyspnea -She is -7.319 L since admission -Continue to monitor respiratory status carefully and she did not desaturate on ambulatory home O2 screen yesterday but was more short of breath today -Per cardiology they will continue to augment her diuresis with IV diuresis with a renal function continues to worsen they will ask renal to evaluate. -Cardiology is for amyloid given that it could be contributing to her orthostasis so myeloma panel has been sent off -Likely can be discharged in next 24 to 48 hours pending repeat PT evaluation and ambulatory home O2 screen   Acute gout: -Primarily affecting right 1st MTP, though diffuse pain is noted in bilateral R > L feet/ankles. Developing this morning. Not NSAID candidate, has allergies to febuxostat and colchicine.  -  Start IV steroid and received a dose of IV Solu-Medrol 40 mg yesterday and today.  - Note she was ambulating 75f w/PT previously, but unable to ambulate yesterday due to pain. Would not currently be a safe discharge to home however she improved her mobility today with PT and ambulated 60 feet with 3 seated rest  breaks between with her rolling walker; she did not desaturate   Stage IV CKD: -Recently on HD due to ATN developing postoperatively after hip fracture and replacement, with subsequent renal recovery.  - Monitor BMP, Cr is stable. -Patient's BUNs/creatinine was improving with diuresis and went from 44/2.92  -> 43/2.69 -> but trended back up to 49/2.75 and is further worsened to 63/2.95 so the cardiology team is restarted nephrology team for further evaluation given that she did previously on dialysis back in March; nephrology feels that she has been adequately diuresed and recommends changing her diuretics to 160 mg the morning and 80 mg of Lasix in the evening p.o. -Cardiology has now changed her p.o. diuresis back to IV -Avoid further nephrotoxic medications, contrast dyes, hypotension and dehydration to ensure adequate renal perfusion and renally dose medications -Repeat CMP in a.m. and now nephrology has been consulted in house by the cardiology team   Bilateral lower leg cellulitis, improving  -Treated as such since arrival with reported improvement. Tx with unasyn is limited by medication intolerances/allergies.  - With improvement, and diarrhea likely associated with abx, will plan to limit Tx to 5 days (last dose 6/30 AM). If needing to continue, would change to alternative agent to avoid the salt load and preferably narrow scope.    CAD s/p CABG, HLD with demand myocardial ischemia: Troponin trend is flat without anginal complaint.  - No inpatient work up indicated - Continue home DAPT, high-intensity statin (augment to rosuvastatin '40mg'$  daily), imdur -Cardiology adding metoprolol succinate and now hydralazine - Check lipid panel in next 2-3 months, LDL is 119.   Vaginal yeast infection -Start nystatin powder and consulted pharmacy for fluconazole one-time dose of 150 mg    PAF:  - 95% pacing on interrogation per cardiology. Rate largely controlled however given her atrial  tachycardia with AV pacing cardiology is asked the device rep to reprogram her pacer and recommending possibly adding a little calcium channel blocker - Not on anticoagulation due to DAPT and low burden of AFib. -Per the patient it showed anemia the cardiologist has reprogrammed her device with max tracking rate of 90 heart rates now are better significantly controlled and Dr. KCaryl Comesfeels that this is contributed to her improvement of her respiratory status   HTN, hypotension:   - Stable on home imdur, does not currently appear to require midodrine but will check her orthostatic vital signs.  -Continue monitor blood pressures per protocol; cardiology has added metoprolol succinate 25 mg p.o. daily and will uptitrate as needed -Cardiology has now added Hydralazine 10 mg q8h   NIDT2DM:  -HbA1c 6.3%.  - Continue SSI. Hold home medications while admitted. -Continue monitor CBGs carefully and they are ranging from 113-245   OSA: -Nonadherent to CPAP at home -Encouraged CPAP qHS.     Elevated d-dimer -LE venous doppler negative for DVT. Clinical scenario not strongly suggestive of PE, so will stop empiric heparin as she is at risk of bleeding and declines further PE evaluation at this time.    Anemia of chronic kidney disease and B12 deficiency:  -No bleeding noted. Iron stores sufficient. - Supplement B12 IM while admitted,  then PO at discharge - Supplement folic acid which is borderline.  - Monitor Hgb intermittently. -Hemoglobin/hematocrit is relatively stable and slightly improved and is now 9.1/30.2 with an MCV of 104.1 -Continue monitor for signs and symptoms of bleeding; no overt bleeding noted as above -Repeat CBC in a.m.   Thrombocytopenia:  -Continue to monitor for signs and symptoms of bleeding  -Platelet count went from 135 -> 130 -> 137 -> 142 -Repeat CBC in a.m.   Obesity -Complicates overall prognosis and care -Estimated body mass index is 39.44 kg/m as calculated from  the following:   Height as of this encounter: '5\' 3"'$  (1.6 m).   Weight as of this encounter: 101 kg.  -Weight Loss and Dietary Counseling given  DVT prophylaxis: heparin injection 5,000 Units Start: 08/07/21 1400    Code Status: Full Code Family Communication: No family currently at bedside  Disposition Plan:  Level of care: Progressive Status is: Inpatient Remains inpatient appropriate because: Needs further clearance by cardiology and nephrology.  Anticipating discharge home in the next 24 to 48 hours   Consultants:  Cardiology Nephrology  Procedures:  Echocardiogram IMPRESSIONS     1. Technically difficult study. Left ventricular ejection fraction, by  estimation, is 45 to 50%. The left ventricle has mildly decreased  function. The left ventricle demonstrates regional wall motion  abnormalities (see scoring diagram/findings for  description). Apical hypokinesis. There is mild left ventricular  hypertrophy. Left ventricular diastolic parameters are indeterminate.   2. Right ventricular systolic function is normal. The right ventricular  size is normal. There is mildly elevated pulmonary artery systolic  pressure. The estimated right ventricular systolic pressure is 02.5 mmHg.   3. The mitral valve is degenerative. No evidence of mitral valve  regurgitation. Moderate mitral annular calcification.   4. The aortic valve was not well visualized. There is moderate  calcification of the aortic valve. Aortic valve regurgitation is trivial.  Mild aortic valve stenosis. Vmax 2.5 m/s, MG 13 mmHg, AVA 2.0 cm^2, DI  0.48   5. The inferior vena cava is dilated in size with <50% respiratory  variability, suggesting right atrial pressure of 15 mmHg.   FINDINGS   Left Ventricle: Left ventricular ejection fraction, by estimation, is 45  to 50%. The left ventricle has mildly decreased function. The left  ventricle demonstrates regional wall motion abnormalities. The left  ventricular  internal cavity size was normal  in size. There is mild left ventricular hypertrophy. Left ventricular  diastolic parameters are indeterminate.      LV Wall Scoring:  The apical lateral segment, apical inferior segment, and apex are  hypokinetic. The entire anterior wall, antero-lateral wall, entire septum,  inferior wall, and posterior wall are normal.   Right Ventricle: The right ventricular size is normal. Right vetricular  wall thickness was not well visualized. Right ventricular systolic  function is normal. There is mildly elevated pulmonary artery systolic  pressure. The tricuspid regurgitant velocity   is 2.59 m/s, and with an assumed right atrial pressure of 15 mmHg, the  estimated right ventricular systolic pressure is 42.7 mmHg.   Left Atrium: Left atrial size was normal in size.   Right Atrium: Right atrial size was normal in size.   Pericardium: There is no evidence of pericardial effusion.   Mitral Valve: The mitral valve is degenerative in appearance. Moderate  mitral annular calcification. No evidence of mitral valve regurgitation.   Tricuspid Valve: The tricuspid valve is normal in structure. Tricuspid  valve regurgitation is  trivial.   Aortic Valve: The aortic valve was not well visualized. There is moderate  calcification of the aortic valve. Aortic valve regurgitation is trivial.  Mild aortic stenosis is present. Aortic valve mean gradient measures 11.7  mmHg. Aortic valve peak gradient   measures 22.9 mmHg. Aortic valve area, by VTI measures 2.03 cm.   Pulmonic Valve: The pulmonic valve was not well visualized. Pulmonic valve  regurgitation is trivial.   Aorta: The aortic root is normal in size and structure.   Venous: The inferior vena cava is dilated in size with less than 50%  respiratory variability, suggesting right atrial pressure of 15 mmHg.   IAS/Shunts: The interatrial septum was not well visualized.      LEFT VENTRICLE  PLAX 2D  LVIDd:          4.80 cm   Diastology  LVIDs:         3.00 cm   LV e' medial:    4.56 cm/s  LV PW:         1.20 cm   LV E/e' medial:  21.8  LV IVS:        1.10 cm   LV e' lateral:   6.75 cm/s  LVOT diam:     2.30 cm   LV E/e' lateral: 14.7  LV SV:         96  LV SV Index:   47  LVOT Area:     4.15 cm      RIGHT VENTRICLE  RV S prime:     5.68 cm/s  TAPSE (M-mode): 1.5 cm   LEFT ATRIUM             Index        RIGHT ATRIUM           Index  LA diam:        3.70 cm 1.83 cm/m   RA Area:     13.00 cm  LA Vol (A2C):   44.9 ml 22.18 ml/m  RA Volume:   26.40 ml  13.04 ml/m  LA Vol (A4C):   75.0 ml 37.04 ml/m  LA Biplane Vol: 64.2 ml 31.71 ml/m   AORTIC VALVE  AV Area (Vmax):    1.68 cm  AV Area (Vmean):   1.88 cm  AV Area (VTI):     2.03 cm  AV Vmax:           239.33 cm/s  AV Vmean:          158.333 cm/s  AV VTI:            0.473 m  AV Peak Grad:      22.9 mmHg  AV Mean Grad:      11.7 mmHg  LVOT Vmax:         97.00 cm/s  LVOT Vmean:        71.800 cm/s  LVOT VTI:          0.231 m  LVOT/AV VTI ratio: 0.49     AORTA  Ao Root diam: 3.70 cm  Ao Asc diam:  3.70 cm   MITRAL VALVE                TRICUSPID VALVE  MV Area (PHT): 4.06 cm     TR Peak grad:   26.8 mmHg  MV Decel Time: 187 msec     TR Vmax:        259.00 cm/s  MV E velocity: 99.20 cm/s  MV A velocity: 145.00 cm/s  SHUNTS  MV E/A ratio:  0.68         Systemic VTI:  0.23 m                              Systemic Diam: 2.30 cm   Antimicrobials:  Anti-infectives (From admission, onward)    Start     Dose/Rate Route Frequency Ordered Stop   08/09/21 1530  fluconazole (DIFLUCAN) tablet 150 mg        150 mg Oral  Once 08/09/21 1437 08/09/21 1711   08/05/21 1000  Ampicillin-Sulbactam (UNASYN) 3 g in sodium chloride 0.9 % 100 mL IVPB        3 g 200 mL/hr over 30 Minutes Intravenous Every 12 hours 08/04/21 2241 08/09/21 1010   08/04/21 2230  Ampicillin-Sulbactam (UNASYN) 3 g in sodium chloride 0.9 % 100 mL IVPB        3 g 200  mL/hr over 30 Minutes Intravenous  Once 08/04/21 2139 08/05/21 0700       Subjective: Seen and examined at bedside and is improved with her dyspnea. No Nausea or vomiting. Denied and lightheadedness. State foot pain is improved. No other concerns or complaints at this time.   Objective: Vitals:   08/11/21 0500 08/11/21 0506 08/11/21 0827 08/11/21 1249  BP:  (!) 160/70  (!) 141/86  Pulse:  79  81  Resp:  18  20  Temp:  97.9 F (36.6 C)  97.7 F (36.5 C)  TempSrc:  Oral  Oral  SpO2:  100% 98% 97%  Weight: 101 kg     Height:        Intake/Output Summary (Last 24 hours) at 08/11/2021 1704 Last data filed at 08/11/2021 1500 Gross per 24 hour  Intake 360 ml  Output 1200 ml  Net -840 ml   Filed Weights   08/09/21 0509 08/10/21 0516 08/11/21 0500  Weight: 100.2 kg 100.3 kg 101 kg   Examination: Physical Exam:  Constitutional: WN/WD obese Caucasian female in NAD Respiratory: Diminished to auscultation bilaterally with coarse breath sounds, no wheezing, rales, rhonchi or crackles. Normal respiratory effort and patient is not tachypenic. No accessory muscle use. Unlabored breathing  Cardiovascular: RRR, no murmurs / rubs / gallops. S1 and S2 auscultated. 1+ LE edema  Abdomen: Soft, non-tender, Distended 2/2 body habitus. Bowel sounds positive.  GU: Deferred. Musculoskeletal: No clubbing / cyanosis of digits/nails. No joint deformity upper and lower extremities.  Neurologic: CN 2-12 grossly intact with no focal deficits. Romberg and sign cerebellar reflexes not assessed.  Psychiatric: Normal judgment and insight. Alert and oriented x 3. Normal mood and appropriate affect.   Data Reviewed: I have personally reviewed following labs and imaging studies  CBC: Recent Labs  Lab 08/07/21 0358 08/08/21 0412 08/09/21 0402 08/10/21 0409 08/11/21 0421  WBC 5.1 5.6 5.0 4.9 4.8  NEUTROABS  --   --   --  4.3 4.0  HGB 9.6* 9.0* 8.9* 8.9* 9.1*  HCT 32.7* 30.7* 29.7* 28.4* 30.2*  MCV 106.2*  107.0* 104.2* 101.1* 104.1*  PLT 145* 135* 130* 137* 557*   Basic Metabolic Panel: Recent Labs  Lab 08/07/21 0358 08/08/21 0412 08/09/21 0402 08/10/21 0409 08/11/21 0421  NA 143 143 138 136 137  K 3.0* 3.2* 3.9 4.2 4.2  CL 91* 90* 88* 88* 88*  CO2 40* 44* 40* 38* 39*  GLUCOSE 112* 119* 165* 164* 187*  BUN 43*  44* 43* 49* 63*  CREATININE 2.94* 2.92* 2.69* 2.75* 2.95*  CALCIUM 9.0 9.2 8.8* 8.7* 8.7*  MG  --   --   --  2.0 2.4  PHOS  --   --   --  4.5 5.1*   GFR: Estimated Creatinine Clearance: 18.1 mL/min (A) (by C-G formula based on SCr of 2.95 mg/dL (H)). Liver Function Tests: Recent Labs  Lab 08/10/21 0409 08/11/21 0421  AST 19 24  ALT 15 19  ALKPHOS 70 81  BILITOT 0.5 0.5  PROT 5.9* 6.2*  ALBUMIN 2.8* 3.1*   No results for input(s): "LIPASE", "AMYLASE" in the last 168 hours. No results for input(s): "AMMONIA" in the last 168 hours. Coagulation Profile: No results for input(s): "INR", "PROTIME" in the last 168 hours. Cardiac Enzymes: No results for input(s): "CKTOTAL", "CKMB", "CKMBINDEX", "TROPONINI" in the last 168 hours. BNP (last 3 results) No results for input(s): "PROBNP" in the last 8760 hours. HbA1C: No results for input(s): "HGBA1C" in the last 72 hours. CBG: Recent Labs  Lab 08/10/21 1157 08/10/21 1645 08/10/21 2049 08/11/21 0736 08/11/21 1131  GLUCAP 112* 169* 245* 149* 113*   Lipid Profile: No results for input(s): "CHOL", "HDL", "LDLCALC", "TRIG", "CHOLHDL", "LDLDIRECT" in the last 72 hours. Thyroid Function Tests: No results for input(s): "TSH", "T4TOTAL", "FREET4", "T3FREE", "THYROIDAB" in the last 72 hours. Anemia Panel: No results for input(s): "VITAMINB12", "FOLATE", "FERRITIN", "TIBC", "IRON", "RETICCTPCT" in the last 72 hours. Sepsis Labs: No results for input(s): "PROCALCITON", "LATICACIDVEN" in the last 168 hours.  Recent Results (from the past 240 hour(s))  SARS Coronavirus 2 by RT PCR (hospital order, performed in [facility]  hospital lab) *cepheid single result test* Anterior Nasal Swab     Status: None   Collection Time: 08/04/21  4:16 PM   Specimen: Anterior Nasal Swab  Result Value Ref Range Status   SARS Coronavirus 2 by RT PCR NEGATIVE NEGATIVE Final    Comment: (NOTE) SARS-CoV-2 target nucleic acids are NOT DETECTED.  The SARS-CoV-2 RNA is generally detectable in upper and lower respiratory specimens during the acute phase of infection. The lowest concentration of SARS-CoV-2 viral copies this assay can detect is 250 copies / mL. A negative result does not preclude SARS-CoV-2 infection and should not be used as the sole basis for treatment or other patient management decisions.  A negative result may occur with improper specimen collection / handling, submission of specimen other than nasopharyngeal swab, presence of viral mutation(s) within the areas targeted by this assay, and inadequate number of viral copies (<250 copies / mL). A negative result must be combined with clinical observations, patient history, and epidemiological information.  Fact Sheet for Patients:   https://www.patel.info/  Fact Sheet for Healthcare Providers: https://hall.com/  This test is not yet approved or  cleared by the [place] FDA and has been authorized for detection and/or diagnosis of SARS-CoV-2 by FDA under an Emergency Use Authorization (EUA).  This EUA will remain in effect (meaning this test can be used) for the duration of the COVID-19 declaration under Section 564(b)(1) of the Act, 21 U.S.C. section 360bbb-3(b)(1), unless the authorization is terminated or revoked sooner.  Performed at [facility], [place] [address] [place], [place] [address]     Radiology Studies: DG CHEST PORT 1 VIEW  Result Date: 08/11/2021 CLINICAL DATA:  Congestive heart failure. EXAM: PORTABLE CHEST 1 VIEW COMPARISON:  Radiographs 08/10/2021, 08/04/2021 and  06/11/2021. FINDINGS: 0506 hours. Unchanged right subclavian AICD and mild cardiomegaly post  CABG. Unchanged pleuroparenchymal opacities at the left lung base. The right lung is clear. No evidence of pneumothorax. IMPRESSION: Stable radiographic appearance of the chest from yesterday. Left basilar pleuroparenchymal opacities appear at least partly chronic. Electronically Signed   By: Richardean Sale M.D.   On: 08/11/2021 08:46   DG CHEST PORT 1 VIEW  Result Date: 08/10/2021 CLINICAL DATA:  Congestive heart failure.  Shortness of breath. EXAM: PORTABLE CHEST 1 VIEW COMPARISON:  Radiographs 08/04/2021, 06/11/2021 and 06/10/2021. CT 08/18/2013. FINDINGS: 0536 hours. Right subclavian AICD leads appear unchanged. There is stable mild cardiac enlargement status post median sternotomy and CABG. Small left pleural effusion or thickening and mild left lower lobe airspace disease are unchanged from the recent prior study, appearing mildly increased from May. The right lung is clear. There is no edema or pneumothorax. The bones appear unremarkable. IMPRESSION: No change in pleuroparenchymal opacities at the left lung base compared with prior study of 1 week ago. No evidence of edema. Electronically Signed   By: Richardean Sale M.D.   On: 08/10/2021 08:51     Scheduled Meds:  allopurinol  100 mg Oral q morning   aspirin EC  81 mg Oral q morning   clopidogrel  75 mg Oral QHS   cycloSPORINE  1 drop Both Eyes BID   folic acid  1 mg Oral Daily   furosemide  80 mg Oral BID   heparin injection (subcutaneous)  5,000 Units Subcutaneous Q8H   hydrALAZINE  25 mg Oral Q8H   insulin aspart  0-6 Units Subcutaneous TID WC   isosorbide mononitrate  90 mg Oral Daily   methylPREDNISolone (SOLU-MEDROL) injection  40 mg Intravenous Q24H   metoprolol succinate  50 mg Oral Daily   mometasone-formoterol  2 puff Inhalation BID   nystatin   Topical TID   pantoprazole  40 mg Oral BID   polyethylene glycol  17 g Oral BID    rosuvastatin  40 mg Oral QPM   senna-docusate  1 tablet Oral BID   Continuous Infusions:  sodium chloride Stopped (08/05/21 1207)    LOS: 7 days   [name], DO [facility] Available via Epic secure chat 7am-7pm After these hours, please refer to coverage provider listed on [email or web address] 08/11/2021, 5:04 PM

## 2021-08-12 ENCOUNTER — Inpatient Hospital Stay (HOSPITAL_COMMUNITY): Payer: Medicare Other

## 2021-08-12 DIAGNOSIS — I5041 Acute combined systolic (congestive) and diastolic (congestive) heart failure: Secondary | ICD-10-CM | POA: Diagnosis not present

## 2021-08-12 DIAGNOSIS — Z951 Presence of aortocoronary bypass graft: Secondary | ICD-10-CM | POA: Diagnosis not present

## 2021-08-12 DIAGNOSIS — I2510 Atherosclerotic heart disease of native coronary artery without angina pectoris: Secondary | ICD-10-CM | POA: Diagnosis not present

## 2021-08-12 DIAGNOSIS — G4733 Obstructive sleep apnea (adult) (pediatric): Secondary | ICD-10-CM | POA: Diagnosis not present

## 2021-08-12 LAB — GLUCOSE, CAPILLARY
Glucose-Capillary: 145 mg/dL — ABNORMAL HIGH (ref 70–99)
Glucose-Capillary: 116 mg/dL — ABNORMAL HIGH (ref 70–99)
Glucose-Capillary: 188 mg/dL — ABNORMAL HIGH (ref 70–99)
Glucose-Capillary: 210 mg/dL — ABNORMAL HIGH (ref 70–99)

## 2021-08-12 LAB — COMPREHENSIVE METABOLIC PANEL
ALT: 22 U/L (ref 0–44)
AST: 23 U/L (ref 15–41)
Albumin: 3.2 g/dL — ABNORMAL LOW (ref 3.5–5.0)
Alkaline Phosphatase: 85 U/L (ref 38–126)
Anion gap: 15 (ref 5–15)
BUN: 72 mg/dL — ABNORMAL HIGH (ref 8–23)
CO2: 34 mmol/L — ABNORMAL HIGH (ref 22–32)
Calcium: 8.6 mg/dL — ABNORMAL LOW (ref 8.9–10.3)
Chloride: 88 mmol/L — ABNORMAL LOW (ref 98–111)
Creatinine, Ser: 3.08 mg/dL — ABNORMAL HIGH (ref 0.44–1.00)
GFR, Estimated: 15 mL/min — ABNORMAL LOW (ref >60)
Glucose, Bld: 153 mg/dL — ABNORMAL HIGH (ref 70–99)
Potassium: 4.2 mmol/L (ref 3.5–5.1)
Sodium: 137 mmol/L (ref 135–145)
Total Bilirubin: 0.4 mg/dL (ref 0.3–1.2)
Total Protein: 6.3 g/dL — ABNORMAL LOW (ref 6.5–8.1)

## 2021-08-12 LAB — CBC WITH DIFFERENTIAL/PLATELET
Abs Immature Granulocytes: 0.1 10*3/uL — ABNORMAL HIGH (ref 0.00–0.07)
Basophils Absolute: 0 10*3/uL (ref 0.0–0.1)
Basophils Relative: 0 %
Eosinophils Absolute: 0 10*3/uL (ref 0.0–0.5)
Eosinophils Relative: 0 %
HCT: 31.2 % — ABNORMAL LOW (ref 36.0–46.0)
Hemoglobin: 9.5 g/dL — ABNORMAL LOW (ref 12.0–15.0)
Immature Granulocytes: 2 %
Lymphocytes Relative: 13 %
Lymphs Abs: 0.7 10*3/uL (ref 0.7–4.0)
MCH: 30.9 pg (ref 26.0–34.0)
MCHC: 30.4 g/dL (ref 30.0–36.0)
MCV: 101.6 fL — ABNORMAL HIGH (ref 80.0–100.0)
Monocytes Absolute: 0.4 10*3/uL (ref 0.1–1.0)
Monocytes Relative: 8 %
Neutro Abs: 3.8 10*3/uL (ref 1.7–7.7)
Neutrophils Relative %: 77 %
Platelets: 146 10*3/uL — ABNORMAL LOW (ref 150–400)
RBC: 3.07 MIL/uL — ABNORMAL LOW (ref 3.87–5.11)
RDW: 12.5 % (ref 11.5–15.5)
WBC: 4.9 10*3/uL (ref 4.0–10.5)
nRBC: 0 % (ref 0.0–0.2)

## 2021-08-12 LAB — PHOSPHORUS: Phosphorus: 4.9 mg/dL — ABNORMAL HIGH (ref 2.5–4.6)

## 2021-08-12 LAB — MAGNESIUM: Magnesium: 2.2 mg/dL (ref 1.7–2.4)

## 2021-08-12 MED ORDER — PREDNISONE 50 MG PO TABS
60.0000 mg | ORAL_TABLET | Freq: Every day | ORAL | Status: DC
  Administered 2021-08-13 – 2021-08-15 (×3): 60 mg via ORAL
  Filled 2021-08-12 (×3): qty 1

## 2021-08-12 NOTE — Progress Notes (Signed)
Occupational Therapy Treatment Patient Details Name: [name] MRN: [id number] DOB: [birth year] Today's Date: 08/12/2021   History of present illness 76 year old female presenting to ED experiencing increasing shortness of breath for the last 3 days with pleuritic chest pain no fever chills or productive cough. In the ER patient has significant edema of the lower extremity and also erythema of both lower extremities. Chest x-ray shows nonspecific finding for concerning for opacity: "IMPRESSION: Increased opacity in the left base may represent atelectasis or developing infiltrate/pneumonia".  D-dimer was elevated.  Venous dopplars negative for evidence of DVTs. Medical history of chronic diastolic CHF, CAD status post CABG, complete heart block status post pacemaker placement was recently on dialysis and has been off dialysis and dialysis catheter removed about 3 weeks ago recently treated for lower extremity cellulitis. Pt seen on 04/03/21 after a fall with LT hip and pelvis fractures.   OT comments  Pt progressing overall towards acute OT goals. Focus of session was energy conservation education as pertains to basic ADLs. Also issued level 1 therband and written HEP for general UB strengthening. Pt received in chair, declined transfers/mobility d/t recently worked with PT and walked in the halls a bit. D/c recommendation remains appropriate.    Recommendations for follow up therapy are one component of a multi-disciplinary discharge planning process, led by the attending physician.  Recommendations may be updated based on patient status, additional functional criteria and insurance authorization.    Follow Up Recommendations  No OT follow up    Assistance Recommended at Discharge Frequent or constant Supervision/Assistance  Patient can return home with the following  A little help with walking and/or transfers;A little help with bathing/dressing/bathroom   Equipment Recommendations  None  recommended by OT    Recommendations for Other Services      Precautions / Restrictions Precautions Precautions: Fall Precaution Comments: fall in February with pelvic fx, monitor o2 sat, painfu;/gouty feet Restrictions Weight Bearing Restrictions: No       Mobility Bed Mobility               General bed mobility comments: OOB in recliner    Transfers Overall transfer level: Needs assistance Equipment used: Rolling walker (2 wheels)               General transfer comment: declined d/t recently walked in the hall with PT     Balance                                           ADL either performed or assessed with clinical judgement   ADL                                         General ADL Comments: Began education on energy conservation as pertains to ADL management.    Extremity/Trunk Assessment Upper Extremity Assessment Upper Extremity Assessment: Generalized weakness   Lower Extremity Assessment Lower Extremity Assessment: Defer to PT evaluation        Vision       Perception     Praxis      Cognition Arousal/Alertness: Awake/alert Behavior During Therapy: WFL for tasks assessed/performed Overall Cognitive Status: Within Functional Limits for tasks assessed  Exercises Exercises: Other exercises Other Exercises Other Exercises: Issued level 1 theraband and written HEP for general UB strengthening.    Shoulder Instructions       General Comments      Pertinent Vitals/ Pain       Pain Assessment Pain Assessment: Faces Faces Pain Scale: Hurts even more Pain Location: not specified Pain Descriptors / Indicators: Aching, Grimacing Pain Intervention(s): Monitored during session  Home Living                                          Prior Functioning/Environment              Frequency  Min 2X/week         Progress Toward Goals  OT Goals(current goals can now be found in the care plan section)  Progress towards OT goals: Progressing toward goals  Acute Rehab OT Goals Patient Stated Goal: to walk to bathroom, return to her home OT Goal Formulation: With patient Time For Goal Achievement: 08/20/21 Potential to Achieve Goals: Good ADL Goals Pt Will Perform Grooming: standing;with modified independence Pt Will Perform Lower Body Dressing: with modified independence;with adaptive equipment;sitting/lateral leans;sit to/from stand Pt Will Transfer to Toilet: with modified independence;ambulating Pt Will Perform Toileting - Clothing Manipulation and hygiene: with modified independence;with adaptive equipment;sitting/lateral leans Pt/caregiver will Perform Home Exercise Program: Increased strength;Both right and left upper extremity;With Supervision  Plan Discharge plan remains appropriate    Co-evaluation                 AM-PAC OT "6 Clicks" Daily Activity     Outcome Measure   Help from another person eating meals?: None Help from another person taking care of personal grooming?: A Little Help from another person toileting, which includes using toliet, bedpan, or urinal?: A Lot Help from another person bathing (including washing, rinsing, drying)?: A Lot Help from another person to put on and taking off regular upper body clothing?: A Little Help from another person to put on and taking off regular lower body clothing?: A Lot 6 Click Score: 16    End of Session Equipment Utilized During Treatment: Oxygen  OT Visit Diagnosis: Unsteadiness on feet (R26.81);Repeated falls (R29.6);History of falling (Z91.81);Pain Pain - part of body: Ankle and joints of foot   Activity Tolerance Patient limited by fatigue   Patient Left in chair;with call bell/phone within reach;with chair alarm set   Nurse Communication          Time: 6440-3474 OT Time Calculation (min): 10  min  Charges: OT General Charges $OT Visit: 1 Visit OT Treatments $Self Care/Home Management : 8-22 mins  Tyrone Schimke, OT Acute Rehabilitation Services Office: [phone]   [name] 08/12/2021, 1:50 PM

## 2021-08-12 NOTE — TOC Progression Note (Signed)
Transition of Care [facility]) - Progression Note    Patient Details  Name: CHAUNTAE HULTS MRN: [id number] Date of Birth: 1945/07/11  Transition of Care [facility]) CM/SW Contact  Leeroy Cha, RN Phone Number: 08/12/2021, 8:19 AM  Clinical Narrative:    [phone] chart reviewed.  Following for toc needs.   Expected Discharge Plan: Home/Self Care Barriers to Discharge: Continued Medical Work up  Expected Discharge Plan and Services Expected Discharge Plan: Home/Self Care   Discharge Planning Services: CM Consult   Living arrangements for the past 2 months: Single Family Home                                       Social Determinants of Health (SDOH) Interventions    Readmission Risk Interventions    01/10/2021    9:47 AM  Readmission Risk Prevention Plan  Transportation Screening Complete  PCP or Specialist Appt within 3-5 Days Complete  HRI or [place] Complete  Social Work Consult for [place] Planning/Counseling Complete  Palliative Care Screening Not Applicable  Medication Review [employer] photographer) Complete

## 2021-08-12 NOTE — Progress Notes (Signed)
Progress Note  Patient Name: [name] Date of Encounter: 08/12/2021  [facility] Cardiologist: Sanda Klein, MD   Subjective   Able to walk in the hallway for a few steps, then has to sit in a chair and rest.  On oxygen at 3 L by nasal cannula.  Overall definite improvement since admission.  Substantially less edema in the lower extremities  Inpatient Medications    Scheduled Meds:  allopurinol  100 mg Oral q morning   aspirin EC  81 mg Oral q morning   clopidogrel  75 mg Oral QHS   cycloSPORINE  1 drop Both Eyes BID   folic acid  1 mg Oral Daily   furosemide  160 mg Oral Daily   And   furosemide  80 mg Oral QPM   heparin injection (subcutaneous)  5,000 Units Subcutaneous Q8H   hydrALAZINE  25 mg Oral Q8H   insulin aspart  0-6 Units Subcutaneous TID WC   isosorbide mononitrate  90 mg Oral Daily   methylPREDNISolone (SOLU-MEDROL) injection  40 mg Intravenous Q24H   metoprolol succinate  50 mg Oral Daily   mometasone-formoterol  2 puff Inhalation BID   nystatin   Topical TID   pantoprazole  40 mg Oral BID   polyethylene glycol  17 g Oral BID   rosuvastatin  40 mg Oral QPM   senna-docusate  1 tablet Oral BID   Continuous Infusions:  sodium chloride Stopped (08/05/21 1207)   PRN Meds: sodium chloride, acetaminophen **OR** acetaminophen, albuterol, cetaphil, guaiFENesin, ondansetron (ZOFRAN) IV, mouth rinse, polyvinyl alcohol, sodium chloride   Vital Signs    Vitals:   08/11/21 2321 08/12/21 0415 08/12/21 0822 08/12/21 1330  BP:  (!) 154/107  125/69  Pulse:  89  79  Resp:  18  18  Temp:  97.9 F (36.6 C)  98 F (36.7 C)  TempSrc:  Oral  Oral  SpO2: 93% 100% 100% 97%  Weight:      Height:        Intake/Output Summary (Last 24 hours) at 08/12/2021 1404 Last data filed at 08/12/2021 0400 Gross per 24 hour  Intake 480 ml  Output 1900 ml  Net -1420 ml      08/11/2021    5:00 AM 08/10/2021    5:16 AM 08/09/2021    5:09 AM  Last 3 Weights  Weight (lbs)  222 lb 10.6 oz 221 lb 1.9 oz 220 lb 14.4 oz  Weight (kg) 101 kg 100.3 kg 100.2 kg      Telemetry    Biventricular pacing- Personally Reviewed  ECG    Biventricular pacing VDD- Personally Reviewed  Physical Exam  Morbidly obese GEN: No acute distress.   Neck: No JVD Cardiac: RRR, no murmurs, rubs, or gallops.  Respiratory: Clear to auscultation bilaterally. GI: Soft, nontender, non-distended  MS: 2-3+ soft pitting symmetrical shin and ankle edema; No deformity. Neuro:  Nonfocal  Psych: Normal affect   Labs    High Sensitivity Troponin:   Recent Labs  Lab 08/04/21 1551 08/04/21 1800  TROPONINIHS 30* 30*     Chemistry Recent Labs  Lab 08/10/21 0409 08/11/21 0421 08/12/21 0346  NA 136 137 137  K 4.2 4.2 4.2  CL 88* 88* 88*  CO2 38* 39* 34*  GLUCOSE 164* 187* 153*  BUN 49* 63* 72*  CREATININE 2.75* 2.95* 3.08*  CALCIUM 8.7* 8.7* 8.6*  MG 2.0 2.4 2.2  PROT 5.9* 6.2* 6.3*  ALBUMIN 2.8* 3.1* 3.2*  AST  $'19 24 23  'r$ ALT '15 19 22  '$ ALKPHOS 70 81 85  BILITOT 0.5 0.5 0.4  GFRNONAA 17* 16* 15*  ANIONGAP '10 10 15    '$ Lipids  Recent Labs  Lab 08/08/21 0412  CHOL 189  TRIG 97  HDL 51  LDLCALC 119*  CHOLHDL 3.7    Hematology Recent Labs  Lab 08/10/21 0409 08/11/21 0421 08/12/21 0346  WBC 4.9 4.8 4.9  RBC 2.81* 2.90* 3.07*  HGB 8.9* 9.1* 9.5*  HCT 28.4* 30.2* 31.2*  MCV 101.1* 104.1* 101.6*  MCH 31.7 31.4 30.9  MCHC 31.3 30.1 30.4  RDW 12.4 12.5 12.5  PLT 137* 142* 146*   Thyroid No results for input(s): "TSH", "FREET4" in the last 168 hours.  BNP Recent Labs  Lab 08/11/21 0421  BNP 1,471.9*    DDimer No results for input(s): "DDIMER" in the last 168 hours.   Radiology    DG CHEST PORT 1 VIEW  Result Date: 08/12/2021 CLINICAL DATA:  76 year old with history of CHF. EXAM: PORTABLE CHEST 1 VIEW COMPARISON:  August 11, 2021. FINDINGS: RIGHT-sided multi lead pacer defibrillator remains in place, power pack over the RIGHT chest entering via RIGHT  subclavian approach. Post median sternotomy for CABG. EKG leads project over the chest as well. Cardiomediastinal contours and hilar structures are stable with signs of cardiomegaly. LEFT lower lobe opacification slightly increased from the prior study. No visible pneumothorax. On limited assessment no acute skeletal findings. IMPRESSION: Cardiomegaly post CABG. Increasing LEFT lower lobe opacification may reflect effusion and volume loss versus developing consolidation. Attention on follow-up. Electronically Signed   By: Zetta Bills M.D.   On: 08/12/2021 08:23   DG CHEST PORT 1 VIEW  Result Date: 08/11/2021 CLINICAL DATA:  Congestive heart failure. EXAM: PORTABLE CHEST 1 VIEW COMPARISON:  Radiographs 08/10/2021, 08/04/2021 and 06/11/2021. FINDINGS: 0506 hours. Unchanged right subclavian AICD and mild cardiomegaly post CABG. Unchanged pleuroparenchymal opacities at the left lung base. The right lung is clear. No evidence of pneumothorax. IMPRESSION: Stable radiographic appearance of the chest from yesterday. Left basilar pleuroparenchymal opacities appear at least partly chronic. Electronically Signed   By: Richardean Sale M.D.   On: 08/11/2021 08:46    Cardiac Studies   ECHO: 08/05/2021  1. Technically difficult study. Left ventricular ejection fraction, by  estimation, is 45 to 50%. The left ventricle has mildly decreased  function. The left ventricle demonstrates regional wall motion  abnormalities (see scoring diagram/findings for description). Apical hypokinesis. There is mild left ventricular hypertrophy. Left ventricular diastolic parameters are indeterminate.   2. Right ventricular systolic function is normal. The right ventricular  size is normal. There is mildly elevated pulmonary artery systolic  pressure. The estimated right ventricular systolic pressure is 80.9 mmHg.   3. The mitral valve is degenerative. No evidence of mitral valve  regurgitation. Moderate mitral annular  calcification.   4. The aortic valve was not well visualized. There is moderate  calcification of the aortic valve. Aortic valve regurgitation is trivial.  Mild aortic valve stenosis. Vmax 2.5 m/s, MG 13 mmHg, AVA 2.0 cm^2, DI 0.48   5. The inferior vena cava is dilated in size with <50% respiratory  variability, suggesting right atrial pressure of 15 mmHg.    CARDIAC CATH 03/11/2019        Ost 1st Diag lesion is 60% stenosed. Ost 2nd Diag lesion is 50% stenosed. Mid LAD lesion is 100% stenosed. Non-stenotic Ost Cx to Mid Cx lesion was previously treated. Prox RCA lesion is  100% stenosed. Non-stenotic Ost Ramus to Ramus lesion was previously treated. Origin to Prox Graft lesion before Ramus is 100% stenosed.   Findings:   Ao = 136/68 (95)  LV =  155/11 RA =  7 RV = 34/9 PA = 34/17 (24) PCW = 14 Fick cardiac output/index = 6.8/3.1 PVR = 1.5 WU FA sat = 96% PA sat = 65%, 75%, 69%   Assessment: 1. Stable coronary anatomy with no change from previous. SVG to OM system chronically occluded with previous PCI of native LCX system. Remainder of grafts are widely patent 2. Very mild PAH with normal left-sided pressures. 3. Very difficult procedure given patient's size and fidgeting on cath table Diagnostic Dominance: Right    Patient Profile     76 y.o. female with CAD s/p CABG, chronic systolic and diastolic heart failure, O2 dependent COPD -morbid obesity, untreated OSA, DM, CHB s/p CRT-P (RA lead dysfunction), PAF, iatrogenic adrenal insufficiency, hypertension, CKD 4 admitted with acute on chronic diastolic heart failure in the setting of lower extremity cellulitis  Assessment & Plan    Continues to improve.  Still has substantial edema but has lost a lot of the extra fluid she had on board.  Creatinine relatively stable at her baseline around 3.0/GFR of 15. Not requiring midodrine. Important to remember that she needs stress dose steroids during acute illness. Continue  diuretics as outlined in the nephrology note.  Pleat course of antibiotics. She has an appointment scheduled on 09/02/2021 with me. We will continue to follow while she is in the hospital.     For questions or updates, please contact [place] Please consult [email or web address] for contact info under        Signed, Sanda Klein, MD  08/12/2021, 2:04 PM

## 2021-08-12 NOTE — Progress Notes (Signed)
PROGRESS NOTE    [name]  HYW:[id number] DOB: 12/24/1945 DOA: 08/04/2021 PCP: Lujean Amel, MD   Brief Narrative:   [name] is a 76 y.o. female with a history of chronic diastolic CHF, CAD s/p CABG, CHB s/p pacemaker who was recently on dialysis for ATN and has been off dialysis and dialysis catheter removed about 3 weeks ago, recently treated for lower extremity cellulitis who presented to the ED 6/25 with worsening dyspnea, lower extremity edema and erythema. BNP was elevated, CXR nonspecific left base opacity favored to represent atelectasis. LVEF 45-50% by echo. IV lasix was started and the patient was admitted with cardiology consultation for acute CHF. D-dimer was positive, though suspicion for PE is low. On 6/29, respiratory status had improved and diuresis is transitioned to oral lasix per cardiology but given further decompensation she is transition back to IV Lasix on 08/10/2021. She has developed acute gout in the right foot, however, which severely limits her mobility for which steroids are started and now improving.  She now complains of some difficulty swallowing and feels like "phlegm is stuck in my throat".  SLP evaluated and recommending regular diet but if necessary placing on a dysphagia 3 diet with thin liquids.   Given her volume overload she was changed back to IV Lasix and hydralazine was given for her elevated blood pressure.  Given that she had atrial tachycardia with asynchronous AV pacing the cardiologist has asked the pacemaker rep to reprogram her device to try for heart rate down.  Cardiology is concern for amyloid and ordered a myeloma panel.  Cardiology is also recommended checking orthostatic vital signs and low-dose hydralazine for blood pressure control.   Patient's dyspnea is improving but because her renal function worsened the cardiology team to discuss with nephrology for further evaluation and will see the patient later this evening and feels  that the patient has been adequately diuresed and are recommending changing the diuretics back to 160 mg of Lasix p.o. in the morning and 80 mg p.o. nightly.  The case was discussed with nephrology and they will want to watch her on her new dose and likely discharge home in the morning.  She is improving slowly.  Assessment and Plan:  Acute on chronic hypoxic respiratory failure due to acute on chronic combined HFrEF:  -Pt is s/p CRT-P with improvement in LVEF with pacing and GDMT, now 45-50% -Continue lasix, transition '80mg'$  IV > PO BID the day before yesterday per cardiology but cardiology will be changed back to IV diuresis however it was never actually done and she is still on p.o. diuresis and creatinine still worsened but nephrology has now made some recommendations and recommending 160 mg of Lasix in the morning and 80 mg of p.o. Lasix in the evening., monitoring daily weights (down 101.1 > 99.2kg which is likely EDW).  -Weaned back to home 2L O2 however she continues to complain of some dyspnea -She is - -8.309 L since admission -Continue to monitor respiratory status carefully and she did not desaturate on ambulatory home O2 screen yesterday but was more short of breath today -Per cardiology they will continue to augment her diuresis with IV diuresis with a renal function continues to worsen they will ask renal to evaluate. -Cardiology is for amyloid given that it could be contributing to her orthostasis so myeloma panel has been sent off -She continues to be edematous but has diuresed quite well and diuretics are being continued per nephrology -Likely can be  discharged in next 24 to 48 hours pending repeat PT evaluation and ambulatory home O2 screen -Patient has an outpatient follow-up with Dr. [name] on 09/02/2021   Acute Gout: -Primarily affecting right 1st MTP, though diffuse pain is noted in bilateral R > L feet/ankles. Developing this morning. Not NSAID candidate, has allergies to  febuxostat and colchicine.  - Start IV steroid and received a dose of IV Solu-Medrol 40 mg again today and will change to p.o. prednisone 60 mg in anticipation for Sterapred taper - Note she was ambulating 48f w/PT previously, but unable to ambulate yesterday due to pain. Would not currently be a safe discharge to home however she improved her mobility today with PT and ambulated 60 feet with 3 seated rest breaks between with her rolling walker; she did not desaturate   Stage IV CKD Hyperphosphatemia  -Recently on HD due to ATN developing postoperatively after hip fracture and replacement, with subsequent renal recovery.  - Monitor BMP, Cr is stable. -Patient's BUNs/creatinine was improving with diuresis and went from 44/2.92  -> 43/2.69 -> but trended back up to 49/2.75 and is further worsened to 63/2.95 so the cardiology team had consulted the nephrology team for further evaluation given that she did previously on dialysis back in March; nephrology feels that she has been adequately diuresed and recommends changing her diuretics to 160 mg the morning and 80 mg of Lasix in the evening p.o. and watching today and discharging tomorrow -Phos Level is now 4.9 -Cardiology following as well -Avoid further nephrotoxic medications, contrast dyes, hypotension and dehydration to ensure adequate renal perfusion and renally dose medications -Repeat CMP in a.m. and now nephrology has been consulted in house by the cardiology team   Bilateral lower leg cellulitis, improving  -Treated as such since arrival with reported improvement. Tx with unasyn is limited by medication intolerances/allergies.  - With improvement, and diarrhea likely associated with abx, will plan to limit Tx to 5 days (last dose 6/30 AM). If needing to continue, would change to alternative agent to avoid the salt load and preferably narrow scope.    CAD s/p CABG, HLD with demand myocardial ischemia: Troponin trend is flat without anginal  complaint.  - No inpatient work up indicated - Continue home DAPT, high-intensity statin (augment to rosuvastatin '40mg'$  daily), imdur -Cardiology adding metoprolol succinate and now hydralazine - Check lipid panel in next 2-3 months, LDL is 119.   Vaginal yeast infection -Start nystatin powder and consulted pharmacy for fluconazole one-time dose of 150 mg    PAF:  - 95% pacing on interrogation per cardiology. Rate largely controlled however given her atrial tachycardia with AV pacing cardiology is asked the device rep to reprogram her pacer and recommending possibly adding a little calcium channel blocker - Not on anticoagulation due to DAPT and low burden of AFib. -Per the patient it showed anemia the cardiologist has reprogrammed her device with max tracking rate of 90 heart rates now are better significantly controlled and Dr. KCaryl Comesfeels that this is contributed to her improvement of her respiratory status   HTN, hypotension:   - Stable on home imdur, does not currently appear to require midodrine but will check her orthostatic vital signs.  -Continue monitor blood pressures per protocol; cardiology has added metoprolol succinate 25 mg p.o. daily and will uptitrate as needed -Cardiology has now added Hydralazine 10 mg q8h and will continue   NIDT2DM:  -HbA1c 6.3%.  - Continue SSI. Hold home medications while admitted. -Continue monitor  CBGs carefully and they are ranging from 116-188   OSA: -Nonadherent to CPAP at home -Encouraged CPAP qHS.     Elevated D-dimer -LE venous doppler negative for DVT. Clinical scenario not strongly suggestive of PE, so will stop empiric heparin as she is at risk of bleeding and declines further PE evaluation at this time.    Anemia of chronic kidney disease and B12 deficiency -No bleeding noted. Iron stores sufficient. - Supplement B12 IM while admitted, then PO at discharge - Supplement folic acid which is borderline.  - Monitor Hgb  intermittently. -Hemoglobin/hematocrit is relatively stable and slightly improved and is now 9.5/31.2 with an MCV of 101.6 -Continue monitor for signs and symptoms of bleeding; no overt bleeding noted as above -Repeat CBC in a.m.   Thrombocytopenia -Continue to monitor for signs and symptoms of bleeding  -Platelet count went from 135 -> 130 -> 137 -> 142 -> 146 -Repeat CBC in a.m.   Obesity -Complicates overall prognosis and care -Estimated body mass index is 39.44 kg/m as calculated from the following:   Height as of this encounter: '5\' 3"'$  (1.6 m).   Weight as of this encounter: 101 kg.  -Weight Loss and Dietary Counseling given  DVT prophylaxis: heparin injection 5,000 Units Start: 08/07/21 1400    Code Status: Full Code Family Communication: No family at bedside   Disposition Plan:  Level of care: Progressive Status is: Inpatient Remains inpatient appropriate because: She continues to improve and continues to diurese fairly well.  PT OT recommending home health and she plans to discharge to her home some when she recovers.  Patient did not desaturate today on ambulation and walk further and is improving per PT   Consultants:  Cardiology Nephrology  Procedures:  Echocardiogram IMPRESSIONS     1. Technically difficult study. Left ventricular ejection fraction, by  estimation, is 45 to 50%. The left ventricle has mildly decreased  function. The left ventricle demonstrates regional wall motion  abnormalities (see scoring diagram/findings for  description). Apical hypokinesis. There is mild left ventricular  hypertrophy. Left ventricular diastolic parameters are indeterminate.   2. Right ventricular systolic function is normal. The right ventricular  size is normal. There is mildly elevated pulmonary artery systolic  pressure. The estimated right ventricular systolic pressure is 08.6 mmHg.   3. The mitral valve is degenerative. No evidence of mitral valve  regurgitation.  Moderate mitral annular calcification.   4. The aortic valve was not well visualized. There is moderate  calcification of the aortic valve. Aortic valve regurgitation is trivial.  Mild aortic valve stenosis. Vmax 2.5 m/s, MG 13 mmHg, AVA 2.0 cm^2, DI  0.48   5. The inferior vena cava is dilated in size with <50% respiratory  variability, suggesting right atrial pressure of 15 mmHg.   FINDINGS   Left Ventricle: Left ventricular ejection fraction, by estimation, is 45  to 50%. The left ventricle has mildly decreased function. The left  ventricle demonstrates regional wall motion abnormalities. The left  ventricular internal cavity size was normal  in size. There is mild left ventricular hypertrophy. Left ventricular  diastolic parameters are indeterminate.      LV Wall Scoring:  The apical lateral segment, apical inferior segment, and apex are  hypokinetic. The entire anterior wall, antero-lateral wall, entire septum,  inferior wall, and posterior wall are normal.   Right Ventricle: The right ventricular size is normal. Right vetricular  wall thickness was not well visualized. Right ventricular systolic  function is normal.  There is mildly elevated pulmonary artery systolic  pressure. The tricuspid regurgitant velocity   is 2.59 m/s, and with an assumed right atrial pressure of 15 mmHg, the  estimated right ventricular systolic pressure is 69.6 mmHg.   Left Atrium: Left atrial size was normal in size.   Right Atrium: Right atrial size was normal in size.   Pericardium: There is no evidence of pericardial effusion.   Mitral Valve: The mitral valve is degenerative in appearance. Moderate  mitral annular calcification. No evidence of mitral valve regurgitation.   Tricuspid Valve: The tricuspid valve is normal in structure. Tricuspid  valve regurgitation is trivial.   Aortic Valve: The aortic valve was not well visualized. There is moderate  calcification of the aortic valve.  Aortic valve regurgitation is trivial.  Mild aortic stenosis is present. Aortic valve mean gradient measures 11.7  mmHg. Aortic valve peak gradient   measures 22.9 mmHg. Aortic valve area, by VTI measures 2.03 cm.   Pulmonic Valve: The pulmonic valve was not well visualized. Pulmonic valve  regurgitation is trivial.   Aorta: The aortic root is normal in size and structure.   Venous: The inferior vena cava is dilated in size with less than 50%  respiratory variability, suggesting right atrial pressure of 15 mmHg.   IAS/Shunts: The interatrial septum was not well visualized.      LEFT VENTRICLE  PLAX 2D  LVIDd:         4.80 cm   Diastology  LVIDs:         3.00 cm   LV e' medial:    4.56 cm/s  LV PW:         1.20 cm   LV E/e' medial:  21.8  LV IVS:        1.10 cm   LV e' lateral:   6.75 cm/s  LVOT diam:     2.30 cm   LV E/e' lateral: 14.7  LV SV:         96  LV SV Index:   47  LVOT Area:     4.15 cm      RIGHT VENTRICLE  RV S prime:     5.68 cm/s  TAPSE (M-mode): 1.5 cm   LEFT ATRIUM             Index        RIGHT ATRIUM           Index  LA diam:        3.70 cm 1.83 cm/m   RA Area:     13.00 cm  LA Vol (A2C):   44.9 ml 22.18 ml/m  RA Volume:   26.40 ml  13.04 ml/m  LA Vol (A4C):   75.0 ml 37.04 ml/m  LA Biplane Vol: 64.2 ml 31.71 ml/m   AORTIC VALVE  AV Area (Vmax):    1.68 cm  AV Area (Vmean):   1.88 cm  AV Area (VTI):     2.03 cm  AV Vmax:           239.33 cm/s  AV Vmean:          158.333 cm/s  AV VTI:            0.473 m  AV Peak Grad:      22.9 mmHg  AV Mean Grad:      11.7 mmHg  LVOT Vmax:         97.00 cm/s  LVOT Vmean:  71.800 cm/s  LVOT VTI:          0.231 m  LVOT/AV VTI ratio: 0.49     AORTA  Ao Root diam: 3.70 cm  Ao Asc diam:  3.70 cm   MITRAL VALVE                TRICUSPID VALVE  MV Area (PHT): 4.06 cm     TR Peak grad:   26.8 mmHg  MV Decel Time: 187 msec     TR Vmax:        259.00 cm/s  MV E velocity: 99.20 cm/s  MV A velocity:  145.00 cm/s  SHUNTS  MV E/A ratio:  0.68         Systemic VTI:  0.23 m                              Systemic Diam: 2.30 cm   Antimicrobials:  Anti-infectives (From admission, onward)    Start     Dose/Rate Route Frequency Ordered Stop   08/09/21 1530  fluconazole (DIFLUCAN) tablet 150 mg        150 mg Oral  Once 08/09/21 1437 08/09/21 1711   08/05/21 1000  Ampicillin-Sulbactam (UNASYN) 3 g in sodium chloride 0.9 % 100 mL IVPB        3 g 200 mL/hr over 30 Minutes Intravenous Every 12 hours 08/04/21 2241 08/09/21 1010   08/04/21 2230  Ampicillin-Sulbactam (UNASYN) 3 g in sodium chloride 0.9 % 100 mL IVPB        3 g 200 mL/hr over 30 Minutes Intravenous  Once 08/04/21 2139 08/05/21 0700       Subjective: Seen and examined at bedside she continues to improve and feels better.  She denies any chest pain or shortness breath.  Spoke with nephrology and nephrology states that she needed to be watched on her new diuretics.  She worked better with PT today and will be discharging likely in the a.m.  She denies any complaints or concerns and states that her abdomen was hurting today slightly but she states that this is chronic.  States that her feet are improved and she overall feels much better today.  Objective: Vitals:   08/11/21 2321 08/12/21 0415 08/12/21 0822 08/12/21 1330  BP:  (!) 154/107  125/69  Pulse:  89  79  Resp:  18  18  Temp:  97.9 F (36.6 C)  98 F (36.7 C)  TempSrc:  Oral  Oral  SpO2: 93% 100% 100% 97%  Weight:      Height:        Intake/Output Summary (Last 24 hours) at 08/12/2021 1730 Last data filed at 08/12/2021 1549 Gross per 24 hour  Intake 960 ml  Output 1950 ml  Net -990 ml   Filed Weights   08/09/21 0509 08/10/21 0516 08/11/21 0500  Weight: 100.2 kg 100.3 kg 101 kg   Examination: Physical Exam:  Constitutional: WN/WD overweight Caucasian female currently no acute distress Respiratory: Diminished to auscultation bilaterally with coarse breath sounds and  some crackles, no wheezing, rales, rhonchi. Normal respiratory effort and patient is not tachypenic. No accessory muscle use.  Wearing supplemental oxygen via nasal count 2 L Cardiovascular: RRR, no murmurs / rubs / gallops. S1 and S2 auscultated.  1+ lower extremity edema bilaterally.  Abdomen: Soft, non-tender, distended secondary body habitus. Bowel sounds positive.  GU: Deferred. Musculoskeletal: No clubbing / cyanosis of digits/nails. No  joint deformity upper and lower extremities.  Skin: Some bruising noted Neurologic: CN 2-12 grossly intact with no focal deficits. Romberg sign and cerebellar reflexes not assessed.  Psychiatric: Normal judgment and insight. Alert and oriented x 3. Normal mood and appropriate affect.   Data Reviewed: I have personally reviewed following labs and imaging studies  CBC: Recent Labs  Lab 08/08/21 0412 08/09/21 0402 08/10/21 0409 08/11/21 0421 08/12/21 0346  WBC 5.6 5.0 4.9 4.8 4.9  NEUTROABS  --   --  4.3 4.0 3.8  HGB 9.0* 8.9* 8.9* 9.1* 9.5*  HCT 30.7* 29.7* 28.4* 30.2* 31.2*  MCV 107.0* 104.2* 101.1* 104.1* 101.6*  PLT 135* 130* 137* 142* 093*   Basic Metabolic Panel: Recent Labs  Lab 08/08/21 0412 08/09/21 0402 08/10/21 0409 08/11/21 0421 08/12/21 0346  NA 143 138 136 137 137  K 3.2* 3.9 4.2 4.2 4.2  CL 90* 88* 88* 88* 88*  CO2 44* 40* 38* 39* 34*  GLUCOSE 119* 165* 164* 187* 153*  BUN 44* 43* 49* 63* 72*  CREATININE 2.92* 2.69* 2.75* 2.95* 3.08*  CALCIUM 9.2 8.8* 8.7* 8.7* 8.6*  MG  --   --  2.0 2.4 2.2  PHOS  --   --  4.5 5.1* 4.9*   GFR: Estimated Creatinine Clearance: 17.3 mL/min (A) (by C-G formula based on SCr of 3.08 mg/dL (H)). Liver Function Tests: Recent Labs  Lab 08/10/21 0409 08/11/21 0421 08/12/21 0346  AST '19 24 23  '$ ALT '15 19 22  '$ ALKPHOS 70 81 85  BILITOT 0.5 0.5 0.4  PROT 5.9* 6.2* 6.3*  ALBUMIN 2.8* 3.1* 3.2*   No results for input(s): "LIPASE", "AMYLASE" in the last 168 hours. No results for input(s):  "AMMONIA" in the last 168 hours. Coagulation Profile: No results for input(s): "INR", "PROTIME" in the last 168 hours. Cardiac Enzymes: No results for input(s): "CKTOTAL", "CKMB", "CKMBINDEX", "TROPONINI" in the last 168 hours. BNP (last 3 results) No results for input(s): "PROBNP" in the last 8760 hours. HbA1C: No results for input(s): "HGBA1C" in the last 72 hours. CBG: Recent Labs  Lab 08/11/21 1718 08/11/21 2106 08/12/21 0754 08/12/21 1153 08/12/21 1628  GLUCAP 223* 212* 145* 116* 188*   Lipid Profile: No results for input(s): "CHOL", "HDL", "LDLCALC", "TRIG", "CHOLHDL", "LDLDIRECT" in the last 72 hours. Thyroid Function Tests: No results for input(s): "TSH", "T4TOTAL", "FREET4", "T3FREE", "THYROIDAB" in the last 72 hours. Anemia Panel: No results for input(s): "VITAMINB12", "FOLATE", "FERRITIN", "TIBC", "IRON", "RETICCTPCT" in the last 72 hours. Sepsis Labs: No results for input(s): "PROCALCITON", "LATICACIDVEN" in the last 168 hours.  Recent Results (from the past 240 hour(s))  SARS Coronavirus 2 by RT PCR (hospital order, performed in [facility] hospital lab) *cepheid single result test* Anterior Nasal Swab     Status: None   Collection Time: 08/04/21  4:16 PM   Specimen: Anterior Nasal Swab  Result Value Ref Range Status   SARS Coronavirus 2 by RT PCR NEGATIVE NEGATIVE Final    Comment: (NOTE) SARS-CoV-2 target nucleic acids are NOT DETECTED.  The SARS-CoV-2 RNA is generally detectable in upper and lower respiratory specimens during the acute phase of infection. The lowest concentration of SARS-CoV-2 viral copies this assay can detect is 250 copies / mL. A negative result does not preclude SARS-CoV-2 infection and should not be used as the sole basis for treatment or other patient management decisions.  A negative result may occur with improper specimen collection / handling, submission of specimen other than nasopharyngeal swab, presence  of viral mutation(s)  within the areas targeted by this assay, and inadequate number of viral copies (<250 copies / mL). A negative result must be combined with clinical observations, patient history, and epidemiological information.  Fact Sheet for Patients:   https://www.patel.info/  Fact Sheet for Healthcare Providers: https://hall.com/  This test is not yet approved or  cleared by the [place] FDA and has been authorized for detection and/or diagnosis of SARS-CoV-2 by FDA under an Emergency Use Authorization (EUA).  This EUA will remain in effect (meaning this test can be used) for the duration of the COVID-19 declaration under Section 564(b)(1) of the Act, 21 U.S.C. section 360bbb-3(b)(1), unless the authorization is terminated or revoked sooner.  Performed at [facility], [place] [address] [place], [place] [address]     Radiology Studies: DG CHEST PORT 1 VIEW  Result Date: 08/12/2021 CLINICAL DATA:  76 year old with history of CHF. EXAM: PORTABLE CHEST 1 VIEW COMPARISON:  August 11, 2021. FINDINGS: RIGHT-sided multi lead pacer defibrillator remains in place, power pack over the RIGHT chest entering via RIGHT subclavian approach. Post median sternotomy for CABG. EKG leads project over the chest as well. Cardiomediastinal contours and hilar structures are stable with signs of cardiomegaly. LEFT lower lobe opacification slightly increased from the prior study. No visible pneumothorax. On limited assessment no acute skeletal findings. IMPRESSION: Cardiomegaly post CABG. Increasing LEFT lower lobe opacification may reflect effusion and volume loss versus developing consolidation. Attention on follow-up. Electronically Signed   By: Zetta Bills M.D.   On: 08/12/2021 08:23   DG CHEST PORT 1 VIEW  Result Date: 08/11/2021 CLINICAL DATA:  Congestive heart failure. EXAM: PORTABLE CHEST 1 VIEW COMPARISON:  Radiographs 08/10/2021, 08/04/2021 and  06/11/2021. FINDINGS: 0506 hours. Unchanged right subclavian AICD and mild cardiomegaly post CABG. Unchanged pleuroparenchymal opacities at the left lung base. The right lung is clear. No evidence of pneumothorax. IMPRESSION: Stable radiographic appearance of the chest from yesterday. Left basilar pleuroparenchymal opacities appear at least partly chronic. Electronically Signed   By: Richardean Sale M.D.   On: 08/11/2021 08:46     Scheduled Meds:  allopurinol  100 mg Oral q morning   aspirin EC  81 mg Oral q morning   clopidogrel  75 mg Oral QHS   cycloSPORINE  1 drop Both Eyes BID   folic acid  1 mg Oral Daily   furosemide  160 mg Oral Daily   And   furosemide  80 mg Oral QPM   heparin injection (subcutaneous)  5,000 Units Subcutaneous Q8H   hydrALAZINE  25 mg Oral Q8H   insulin aspart  0-6 Units Subcutaneous TID WC   isosorbide mononitrate  90 mg Oral Daily   metoprolol succinate  50 mg Oral Daily   mometasone-formoterol  2 puff Inhalation BID   nystatin   Topical TID   pantoprazole  40 mg Oral BID   polyethylene glycol  17 g Oral BID   [START ON 08/13/2021] predniSONE  60 mg Oral Q breakfast   rosuvastatin  40 mg Oral QPM   senna-docusate  1 tablet Oral BID   Continuous Infusions:  sodium chloride Stopped (08/05/21 1207)    LOS: 8 days   [name], DO [facility] Available via Epic secure chat 7am-7pm After these hours, please refer to coverage provider listed on [email or web address] 08/12/2021, 5:30 PM

## 2021-08-12 NOTE — Progress Notes (Signed)
Physical Therapy Treatment Patient Details Name: [name] MRN: [id number] DOB: [birth year] Today's Date: 08/12/2021   History of Present Illness 76 year old female presenting to ED experiencing increasing shortness of breath for the last 3 days with pleuritic chest pain no fever chills or productive cough. In the ER patient has significant edema of the lower extremity and also erythema of both lower extremities. Chest x-ray shows nonspecific finding for concerning for opacity: "IMPRESSION: Increased opacity in the left base may represent atelectasis or developing infiltrate/pneumonia".  D-dimer was elevated.  Venous dopplars negative for evidence of DVTs. Medical history of chronic diastolic CHF, CAD status post CABG, complete heart block status post pacemaker placement was recently on dialysis and has been off dialysis and dialysis catheter removed about 3 weeks ago recently treated for lower extremity cellulitis. Pt seen on 04/03/21 after a fall with LT hip and pelvis fractures.    PT Comments    AxO x 3 very pleasant Lady, eager to go home.  (Son's home to recover)  Already OOB.  Assisted with amb in hallway.  General Gait Details: tolerated an increased distance in intervals of 35 feet, 20 feet, another 20 feet all with seated rest break between.  Pt remianed on 2 lts sats ranged fro 89 - 94% and avg HR 98. Pt is progressing with her mobility. Pt plans to D/C to her Son's home to recover.    Recommendations for follow up therapy are one component of a multi-disciplinary discharge planning process, led by the attending physician.  Recommendations may be updated based on patient status, additional functional criteria and insurance authorization.  Follow Up Recommendations  Home health PT (at [facility])     Assistance Recommended at Discharge Intermittent Supervision/Assistance  Patient can return home with the following A little help with walking and/or transfers;A little help with  bathing/dressing/bathroom;Assistance with cooking/housework;Direct supervision/assist for medications management;Direct supervision/assist for financial management;Assist for transportation;Help with stairs or ramp for entrance   Equipment Recommendations  None recommended by PT    Recommendations for Other Services       Precautions / Restrictions Precautions Precautions: Fall Precaution Comments: Chronic Home oxygen 2 lts Restrictions Weight Bearing Restrictions: No     Mobility  Bed Mobility               General bed mobility comments: OOB in recliner    Transfers Overall transfer level: Needs assistance Equipment used: Rolling walker (2 wheels) Transfers: Sit to/from Stand Sit to Stand: Supervision           General transfer comment: pt self able to rise from recliner 3/3 times.    Ambulation/Gait Ambulation/Gait assistance: Supervision, Min guard Gait Distance (Feet): 75 Feet Assistive device: Rolling walker (2 wheels) Gait Pattern/deviations: Step-through pattern, Decreased stride length, Decreased step length - left, Decreased step length - right, Trunk flexed, Wide base of support       General Gait Details: tolerated an increased distance in intervals of 35 feet, 20 feet, another 20 feet all with seated rest break between.  Pt remianed on 2 lts sats ranged fro 89 - 94% and avg HR 98.   Stairs             Wheelchair Mobility    Modified Rankin (Stroke Patients Only)       Balance  Cognition Arousal/Alertness: Awake/alert Behavior During Therapy: WFL for tasks assessed/performed Overall Cognitive Status: Within Functional Limits for tasks assessed                                 General Comments: AxO x 3 very pleasant Lady who plans to D/C to [facility], no steps to enter home.        Exercises      General Comments        Pertinent Vitals/Pain  Pain Assessment Pain Assessment: No/denies pain    Home Living                          Prior Function            PT Goals (current goals can now be found in the care plan section) Progress towards PT goals: Progressing toward goals    Frequency    Min 3X/week      PT Plan Current plan remains appropriate    Co-evaluation              AM-PAC PT "6 Clicks" Mobility   Outcome Measure  Help needed turning from your back to your side while in a flat bed without using bedrails?: A Little Help needed moving from lying on your back to sitting on the side of a flat bed without using bedrails?: A Little Help needed moving to and from a bed to a chair (including a wheelchair)?: A Little Help needed standing up from a chair using your arms (e.g., wheelchair or bedside chair)?: A Little Help needed to walk in hospital room?: A Little Help needed climbing 3-5 steps with a railing? : A Lot 6 Click Score: 17    End of Session Equipment Utilized During Treatment: Gait belt;Oxygen Activity Tolerance: Patient tolerated treatment well Patient left: in chair;with call bell/phone within reach;with chair alarm set Nurse Communication: Mobility status PT Visit Diagnosis: Muscle weakness (generalized) (M62.81);Difficulty in walking, not elsewhere classified (R26.2);Unsteadiness on feet (R26.81)     Time: 1517-6160 PT Time Calculation (min) (ACUTE ONLY): 33 min  Charges:  $Gait Training: 8-22 mins $Therapeutic Activity: 8-22 mins                     Rica Koyanagi  PTA Acute  Rehabilitation Services Office M-F          [phone] Weekend pager [phone]

## 2021-08-12 NOTE — Discharge Summary (Incomplete)
Physician Discharge Summary   Patient: [name] MRN: [id number] DOB: 13-Mar-1945  Admit date:     08/04/2021  Discharge date: 08/12/21  Discharge Physician: [name], DO   PCP: Lujean Amel, MD   Recommendations at discharge:    ***  Discharge Diagnoses: Principal Problem:   Acute CHF (congestive heart failure) ([place]) Active Problems:   OSA (obstructive sleep apnea)   CAD S/P percutaneous coronary angioplasty   S/P CABG x 5   ESRD (end stage renal disease) ([place])   Acute combined systolic and diastolic heart failure ([place])  Resolved Problems:   * No resolved hospital problems. [place] [facility] Course: No notes on file  Assessment and Plan: No notes have been filed under this hospital service. Service: Hospitalist     {Tip this will not be part of the note when signed Body mass index is 39.44 kg/m. , ,  Active Pressure Injury/Wound(s)     Pressure Ulcer  Duration          Pressure Injury 04/13/21 Face Left;Upper Stage 1 -  Intact skin with non-blanchable redness of a localized area usually over a bony prominence. 1x1 from bipap mask 120 days   Pressure Injury 04/13/21 Sacrum Mid Deep Tissue Pressure Injury - Purple or maroon localized area of discolored intact skin or blood-filled blister due to damage of underlying soft tissue from pressure and/or shear. Dark purple, not blancheable- possibly 120 days           (Optional):26781}  {(NOTE) Pain control PDMP Statment (Optional):26782} Consultants: *** Procedures performed: ***  Disposition: {Plan; Disposition:26390} Diet recommendation:  {Diet_Plan:26776} DISCHARGE MEDICATION: Allergies as of 08/12/2021       Reactions   Ivp Dye [iodinated Contrast Media] Shortness Of Breath   No reaction to PO contrast with non-ionic dye.06-25-2014/rsm   Shellfish Allergy Anaphylaxis   Sulfa Antibiotics Shortness Of Breath   Iodine Hives   Atorvastatin Other (See Comments)   Pt states "causes bilateral leg  pain/cramps."   Benadryl [diphenhydramine] Other (See Comments)   "THIS DRIVES ME CRAZY AND MAKES ME FEEL LIKE I AM DYING"   Colchicine Nausea And Vomiting   Doxycycline Other (See Comments)   Unknown reaction, per patient   Uloric [febuxostat] Other (See Comments)   Unknown reaction   Cephalexin Itching, Rash   Zithromax [azithromycin] Rash     Med Rec must be completed prior to using this [facility]***       Discharge Exam: Filed Weights   08/09/21 0509 08/10/21 0516 08/11/21 0500  Weight: 100.2 kg 100.3 kg 101 kg   ***  Condition at discharge: {DC Condition:26389}  The results of significant diagnostics from this hospitalization (including imaging, microbiology, ancillary and laboratory) are listed below for reference.   Imaging Studies: DG CHEST PORT 1 VIEW  Result Date: 08/12/2021 CLINICAL DATA:  76 year old with history of CHF. EXAM: PORTABLE CHEST 1 VIEW COMPARISON:  August 11, 2021. FINDINGS: RIGHT-sided multi lead pacer defibrillator remains in place, power pack over the RIGHT chest entering via RIGHT subclavian approach. Post median sternotomy for CABG. EKG leads project over the chest as well. Cardiomediastinal contours and hilar structures are stable with signs of cardiomegaly. LEFT lower lobe opacification slightly increased from the prior study. No visible pneumothorax. On limited assessment no acute skeletal findings. IMPRESSION: Cardiomegaly post CABG. Increasing LEFT lower lobe opacification may reflect effusion and volume loss versus developing consolidation. Attention on follow-up. Electronically Signed   By: Zetta Bills M.D.   On: 08/12/2021 08:23  DG CHEST PORT 1 VIEW  Result Date: 08/11/2021 CLINICAL DATA:  Congestive heart failure. EXAM: PORTABLE CHEST 1 VIEW COMPARISON:  Radiographs 08/10/2021, 08/04/2021 and 06/11/2021. FINDINGS: 0506 hours. Unchanged right subclavian AICD and mild cardiomegaly post CABG. Unchanged pleuroparenchymal opacities at the left  lung base. The right lung is clear. No evidence of pneumothorax. IMPRESSION: Stable radiographic appearance of the chest from yesterday. Left basilar pleuroparenchymal opacities appear at least partly chronic. Electronically Signed   By: Richardean Sale M.D.   On: 08/11/2021 08:46   DG CHEST PORT 1 VIEW  Result Date: 08/10/2021 CLINICAL DATA:  Congestive heart failure.  Shortness of breath. EXAM: PORTABLE CHEST 1 VIEW COMPARISON:  Radiographs 08/04/2021, 06/11/2021 and 06/10/2021. CT 08/18/2013. FINDINGS: 0536 hours. Right subclavian AICD leads appear unchanged. There is stable mild cardiac enlargement status post median sternotomy and CABG. Small left pleural effusion or thickening and mild left lower lobe airspace disease are unchanged from the recent prior study, appearing mildly increased from May. The right lung is clear. There is no edema or pneumothorax. The bones appear unremarkable. IMPRESSION: No change in pleuroparenchymal opacities at the left lung base compared with prior study of 1 week ago. No evidence of edema. Electronically Signed   By: Richardean Sale M.D.   On: 08/10/2021 08:51   VAS [place] LOWER EXTREMITY VENOUS (DVT)  Result Date: 08/05/2021  Lower Venous DVT Study Patient Name:  [name]  Date of Exam:   08/05/2021 Medical Rec #: [id number]          Accession #:    [id number] Date of Birth: 03/22/1945           Patient Gender: F Patient Age:   76 years Exam Location:  [facility] Procedure:      VAS [place] LOWER EXTREMITY VENOUS (DVT) Referring Phys: Gean Birchwood --------------------------------------------------------------------------------  Indications: Pain, edema, redness, and warmth to BLE (cellulitis).  Limitations: Body habitus and poor ultrasound/tissue interface. Comparison Study: Previous exams on 04/13/21, 02/14/21, 06/08/18 ([facility]), 10/15/18,                   09/29/17, 01/16/17, 04/29/15, 12/22/14, & 07/01/00 were ALL                   negative for DVT. Performing  Technologist: Rogelia Rohrer RVT, RDMS  Examination Guidelines: A complete evaluation includes B-mode imaging, spectral Doppler, color Doppler, and power Doppler as needed of all accessible portions of each vessel. Bilateral testing is considered an integral part of a complete examination. Limited examinations for reoccurring indications may be performed as noted. The reflux portion of the exam is performed with the patient in reverse Trendelenburg.  +---------+---------------+---------+-----------+----------+-------------------+ RIGHT    CompressibilityPhasicitySpontaneityPropertiesThrombus Aging      +---------+---------------+---------+-----------+----------+-------------------+ CFV      Full           No       Yes                                      +---------+---------------+---------+-----------+----------+-------------------+ SFJ      Full                                                             +---------+---------------+---------+-----------+----------+-------------------+  FV Prox  Full           No       Yes                                      +---------+---------------+---------+-----------+----------+-------------------+ FV Mid   Full           No       Yes                                      +---------+---------------+---------+-----------+----------+-------------------+ FV DistalFull           No       Yes                                      +---------+---------------+---------+-----------+----------+-------------------+ PFV      Full                                                             +---------+---------------+---------+-----------+----------+-------------------+ POP      Full           No       Yes                                      +---------+---------------+---------+-----------+----------+-------------------+ PTV      Full                                         Not well visualized  +---------+---------------+---------+-----------+----------+-------------------+ PERO                                                  Not visualized      +---------+---------------+---------+-----------+----------+-------------------+   Right Technical Findings: Not visualized segments include Peroneal veins. Pulsatile flow noted in all doppler waveforms of RLE.  +--------+---------------+---------+-----------+----------+--------------------+ LEFT    CompressibilityPhasicitySpontaneityPropertiesThrombus Aging       +--------+---------------+---------+-----------+----------+--------------------+ CFV     Full           No       Yes                                       +--------+---------------+---------+-----------+----------+--------------------+ SFJ     Full                                                              +--------+---------------+---------+-----------+----------+--------------------+ FV Prox Full           Yes  Yes                                       +--------+---------------+---------+-----------+----------+--------------------+ FV Mid                 Yes      Yes                  patent by                                                                 color/doppler        +--------+---------------+---------+-----------+----------+--------------------+ FV                     Yes      Yes                  patent by            Distal                                               color/doppler        +--------+---------------+---------+-----------+----------+--------------------+ PFV     Full                                                              +--------+---------------+---------+-----------+----------+--------------------+ POP     Full           Yes      Yes                                       +--------+---------------+---------+-----------+----------+--------------------+ PTV                                                   Not well visualized  +--------+---------------+---------+-----------+----------+--------------------+ PERO    Full                                         Not well visualized  +--------+---------------+---------+-----------+----------+--------------------+   Left Technical Findings: Pulsatile flow noted in CFV   Summary: BILATERAL: - No evidence of deep vein thrombosis seen in the lower extremities, bilaterally. -No evidence of popliteal cyst, bilaterally. - Subcutaneous edema seen in area of calf & ankle, bilaterally.   *See table(s) above for measurements and observations. Electronically signed by Servando Snare MD on 08/05/2021 at 3:15:51 PM.    Final    ECHOCARDIOGRAM COMPLETE  Result Date: 08/05/2021    ECHOCARDIOGRAM REPORT   Patient Name:   [name] Date of  Exam: 08/05/2021 Medical Rec #:  [id number]         Height:       63.0 in Accession #:    [id number]        Weight:       222.9 lb Date of Birth:  08/06/1945          BSA:          2.025 m Patient Age:    24 years          BP:           122/66 mmHg Patient Gender: F                 HR:           97 bpm. Exam Location:  Inpatient Procedure: 2D Echo, Cardiac Doppler and Color Doppler Indications:    Dyspnea R06.00  History:        Patient has prior history of Echocardiogram examinations, most                 recent 09/17/2020. Cardiomyopathy and CHF, CAD, Pacemaker, Stroke,                 Arrythmias:Atrial Fibrillation, Signs/Symptoms:Dyspnea; Risk                 Factors:Hypertension, GERD, Diabetes and Sleep Apnea.  Sonographer:    Bernadene Person RDCS Referring Phys: [id number] [place]  Sonographer Comments: Patient is morbidly obese. IMPRESSIONS  1. Technically difficult study. Left ventricular ejection fraction, by estimation, is 45 to 50%. The left ventricle has mildly decreased function. The left ventricle demonstrates regional wall motion abnormalities (see scoring diagram/findings for description). Apical  hypokinesis. There is mild left ventricular hypertrophy. Left ventricular diastolic parameters are indeterminate.  2. Right ventricular systolic function is normal. The right ventricular size is normal. There is mildly elevated pulmonary artery systolic pressure. The estimated right ventricular systolic pressure is 38.2 mmHg.  3. The mitral valve is degenerative. No evidence of mitral valve regurgitation. Moderate mitral annular calcification.  4. The aortic valve was not well visualized. There is moderate calcification of the aortic valve. Aortic valve regurgitation is trivial. Mild aortic valve stenosis. Vmax 2.5 m/s, MG 13 mmHg, AVA 2.0 cm^2, DI 0.48  5. The inferior vena cava is dilated in size with <50% respiratory variability, suggesting right atrial pressure of 15 mmHg. FINDINGS  Left Ventricle: Left ventricular ejection fraction, by estimation, is 45 to 50%. The left ventricle has mildly decreased function. The left ventricle demonstrates regional wall motion abnormalities. The left ventricular internal cavity size was normal in size. There is mild left ventricular hypertrophy. Left ventricular diastolic parameters are indeterminate.  LV Wall Scoring: The apical lateral segment, apical inferior segment, and apex are hypokinetic. The entire anterior wall, antero-lateral wall, entire septum, inferior wall, and posterior wall are normal. Right Ventricle: The right ventricular size is normal. Right vetricular wall thickness was not well visualized. Right ventricular systolic function is normal. There is mildly elevated pulmonary artery systolic pressure. The tricuspid regurgitant velocity  is 2.59 m/s, and with an assumed right atrial pressure of 15 mmHg, the estimated right ventricular systolic pressure is 50.5 mmHg. Left Atrium: Left atrial size was normal in size. Right Atrium: Right atrial size was normal in size. Pericardium: There is no evidence of pericardial effusion. Mitral Valve: The mitral valve is  degenerative in appearance. Moderate mitral annular calcification. No evidence of mitral valve regurgitation. Tricuspid Valve: The tricuspid  valve is normal in structure. Tricuspid valve regurgitation is trivial. Aortic Valve: The aortic valve was not well visualized. There is moderate calcification of the aortic valve. Aortic valve regurgitation is trivial. Mild aortic stenosis is present. Aortic valve mean gradient measures 11.7 mmHg. Aortic valve peak gradient  measures 22.9 mmHg. Aortic valve area, by VTI measures 2.03 cm. Pulmonic Valve: The pulmonic valve was not well visualized. Pulmonic valve regurgitation is trivial. Aorta: The aortic root is normal in size and structure. Venous: The inferior vena cava is dilated in size with less than 50% respiratory variability, suggesting right atrial pressure of 15 mmHg. IAS/Shunts: The interatrial septum was not well visualized.  LEFT VENTRICLE PLAX 2D LVIDd:         4.80 cm   Diastology LVIDs:         3.00 cm   LV e' medial:    4.56 cm/s LV PW:         1.20 cm   LV E/e' medial:  21.8 LV IVS:        1.10 cm   LV e' lateral:   6.75 cm/s LVOT diam:     2.30 cm   LV E/e' lateral: 14.7 LV SV:         96 LV SV Index:   47 LVOT Area:     4.15 cm  RIGHT VENTRICLE RV S prime:     5.68 cm/s TAPSE (M-mode): 1.5 cm LEFT ATRIUM             Index        RIGHT ATRIUM           Index LA diam:        3.70 cm 1.83 cm/m   RA Area:     13.00 cm LA Vol (A2C):   44.9 ml 22.18 ml/m  RA Volume:   26.40 ml  13.04 ml/m LA Vol (A4C):   75.0 ml 37.04 ml/m LA Biplane Vol: 64.2 ml 31.71 ml/m  AORTIC VALVE AV Area (Vmax):    1.68 cm AV Area (Vmean):   1.88 cm AV Area (VTI):     2.03 cm AV Vmax:           239.33 cm/s AV Vmean:          158.333 cm/s AV VTI:            0.473 m AV Peak Grad:      22.9 mmHg AV Mean Grad:      11.7 mmHg LVOT Vmax:         97.00 cm/s LVOT Vmean:        71.800 cm/s LVOT VTI:          0.231 m LVOT/AV VTI ratio: 0.49  AORTA Ao Root diam: 3.70 cm Ao Asc diam:  3.70  cm MITRAL VALVE                TRICUSPID VALVE MV Area (PHT): 4.06 cm     TR Peak grad:   26.8 mmHg MV Decel Time: 187 msec     TR Vmax:        259.00 cm/s MV E velocity: 99.20 cm/s MV A velocity: 145.00 cm/s  SHUNTS MV E/A ratio:  0.68         Systemic VTI:  0.23 m                             Systemic Diam: 2.30  cm Oswaldo Milian MD Electronically signed by Oswaldo Milian MD Signature Date/Time: 08/05/2021/2:26:03 PM    Final    DG Chest 2 View  Result Date: 08/04/2021 CLINICAL DATA:  Shortness of breath. EXAM: CHEST - 2 VIEW COMPARISON:  Multiple chest x-rays since April 12, 2021 FINDINGS: Increased opacity in the left base with streaking components. Stable pacemaker/AICD device. Stable cardiomediastinal silhouette. No pneumothorax. No nodules or masses. No overt edema. IMPRESSION: Increased opacity in the left base may represent atelectasis or developing infiltrate/pneumonia. Recommend clinical correlation with short-term follow-up imaging to ensure resolution. Electronically Signed   By: Dorise Bullion III M.D.   On: 08/04/2021 16:24    Microbiology: Results for orders placed or performed during the hospital encounter of 08/04/21  SARS Coronavirus 2 by RT PCR (hospital order, performed in [facility] hospital lab) *cepheid single result test* Anterior Nasal Swab     Status: None   Collection Time: 08/04/21  4:16 PM   Specimen: Anterior Nasal Swab  Result Value Ref Range Status   SARS Coronavirus 2 by RT PCR NEGATIVE NEGATIVE Final    Comment: (NOTE) SARS-CoV-2 target nucleic acids are NOT DETECTED.  The SARS-CoV-2 RNA is generally detectable in upper and lower respiratory specimens during the acute phase of infection. The lowest concentration of SARS-CoV-2 viral copies this assay can detect is 250 copies / mL. A negative result does not preclude SARS-CoV-2 infection and should not be used as the sole basis for treatment or other patient management decisions.  A negative result  may occur with improper specimen collection / handling, submission of specimen other than nasopharyngeal swab, presence of viral mutation(s) within the areas targeted by this assay, and inadequate number of viral copies (<250 copies / mL). A negative result must be combined with clinical observations, patient history, and epidemiological information.  Fact Sheet for Patients:   https://www.patel.info/  Fact Sheet for Healthcare Providers: https://hall.com/  This test is not yet approved or  cleared by the [place] FDA and has been authorized for detection and/or diagnosis of SARS-CoV-2 by FDA under an Emergency Use Authorization (EUA).  This EUA will remain in effect (meaning this test can be used) for the duration of the COVID-19 declaration under Section 564(b)(1) of the Act, 21 U.S.C. section 360bbb-3(b)(1), unless the authorization is terminated or revoked sooner.  Performed at [facility], [place] [address] [place], [place] [address]    *Note: Due to a large number of results and/or encounters for the requested time period, some results have not been displayed. A complete set of results can be found in Results Review.    Labs: CBC: Recent Labs  Lab 08/08/21 0412 08/09/21 0402 08/10/21 0409 08/11/21 0421 08/12/21 0346  WBC 5.6 5.0 4.9 4.8 4.9  NEUTROABS  --   --  4.3 4.0 3.8  HGB 9.0* 8.9* 8.9* 9.1* 9.5*  HCT 30.7* 29.7* 28.4* 30.2* 31.2*  MCV 107.0* 104.2* 101.1* 104.1* 101.6*  PLT 135* 130* 137* 142* 604*   Basic Metabolic Panel: Recent Labs  Lab 08/08/21 0412 08/09/21 0402 08/10/21 0409 08/11/21 0421 08/12/21 0346  NA 143 138 136 137 137  K 3.2* 3.9 4.2 4.2 4.2  CL 90* 88* 88* 88* 88*  CO2 44* 40* 38* 39* 34*  GLUCOSE 119* 165* 164* 187* 153*  BUN 44* 43* 49* 63* 72*  CREATININE 2.92* 2.69* 2.75* 2.95* 3.08*  CALCIUM 9.2 8.8* 8.7* 8.7* 8.6*  MG  --   --  2.0 2.4 2.2  PHOS  --   --  4.5  5.1* 4.9*   Liver Function Tests: Recent Labs  Lab 08/10/21 0409 08/11/21 0421 08/12/21 0346  AST '19 24 23  '$ ALT '15 19 22  '$ ALKPHOS 70 81 85  BILITOT 0.5 0.5 0.4  PROT 5.9* 6.2* 6.3*  ALBUMIN 2.8* 3.1* 3.2*   CBG: Recent Labs  Lab 08/11/21 0736 08/11/21 1131 08/11/21 1718 08/11/21 2106 08/12/21 0754  GLUCAP 149* 113* 223* 212* 145*    Discharge time spent: {LESS THAN/GREATER ULAG:53646} 30 minutes.  Signed: Kerney Elbe, DO [facility] 08/12/2021

## 2021-08-12 NOTE — Care Management Important Message (Signed)
Important Message  Patient Details IM Letter given to the Patient. Name: [name] MRN: [id number] Date of Birth: September 30, 1945   Medicare Important Message Given:  Yes     Kerin Salen 08/12/2021, 1:22 PM

## 2021-08-13 ENCOUNTER — Inpatient Hospital Stay (HOSPITAL_COMMUNITY): Payer: Medicare Other

## 2021-08-13 DIAGNOSIS — Z951 Presence of aortocoronary bypass graft: Secondary | ICD-10-CM | POA: Diagnosis not present

## 2021-08-13 DIAGNOSIS — G4733 Obstructive sleep apnea (adult) (pediatric): Secondary | ICD-10-CM | POA: Diagnosis not present

## 2021-08-13 DIAGNOSIS — I5041 Acute combined systolic (congestive) and diastolic (congestive) heart failure: Secondary | ICD-10-CM | POA: Diagnosis not present

## 2021-08-13 DIAGNOSIS — I2510 Atherosclerotic heart disease of native coronary artery without angina pectoris: Secondary | ICD-10-CM | POA: Diagnosis not present

## 2021-08-13 LAB — GLUCOSE, CAPILLARY
Glucose-Capillary: 149 mg/dL — ABNORMAL HIGH (ref 70–99)
Glucose-Capillary: 143 mg/dL — ABNORMAL HIGH (ref 70–99)
Glucose-Capillary: 219 mg/dL — ABNORMAL HIGH (ref 70–99)
Glucose-Capillary: 202 mg/dL — ABNORMAL HIGH (ref 70–99)

## 2021-08-13 LAB — COMPREHENSIVE METABOLIC PANEL
ALT: 20 U/L (ref 0–44)
AST: 17 U/L (ref 15–41)
Albumin: 3.1 g/dL — ABNORMAL LOW (ref 3.5–5.0)
Alkaline Phosphatase: 89 U/L (ref 38–126)
Anion gap: 14 (ref 5–15)
BUN: 79 mg/dL — ABNORMAL HIGH (ref 8–23)
CO2: 35 mmol/L — ABNORMAL HIGH (ref 22–32)
Calcium: 8.6 mg/dL — ABNORMAL LOW (ref 8.9–10.3)
Chloride: 89 mmol/L — ABNORMAL LOW (ref 98–111)
Creatinine, Ser: 3.03 mg/dL — ABNORMAL HIGH (ref 0.44–1.00)
GFR, Estimated: 15 mL/min — ABNORMAL LOW (ref >60)
Glucose, Bld: 145 mg/dL — ABNORMAL HIGH (ref 70–99)
Potassium: 4.2 mmol/L (ref 3.5–5.1)
Sodium: 138 mmol/L (ref 135–145)
Total Bilirubin: 0.5 mg/dL (ref 0.3–1.2)
Total Protein: 6.1 g/dL — ABNORMAL LOW (ref 6.5–8.1)

## 2021-08-13 LAB — CBC WITH DIFFERENTIAL/PLATELET
Abs Immature Granulocytes: 0.08 10*3/uL — ABNORMAL HIGH (ref 0.00–0.07)
Basophils Absolute: 0 10*3/uL (ref 0.0–0.1)
Basophils Relative: 0 %
Eosinophils Absolute: 0 10*3/uL (ref 0.0–0.5)
Eosinophils Relative: 0 %
HCT: 32.6 % — ABNORMAL LOW (ref 36.0–46.0)
Hemoglobin: 9.6 g/dL — ABNORMAL LOW (ref 12.0–15.0)
Immature Granulocytes: 2 %
Lymphocytes Relative: 16 %
Lymphs Abs: 0.8 10*3/uL (ref 0.7–4.0)
MCH: 30.6 pg (ref 26.0–34.0)
MCHC: 29.4 g/dL — ABNORMAL LOW (ref 30.0–36.0)
MCV: 103.8 fL — ABNORMAL HIGH (ref 80.0–100.0)
Monocytes Absolute: 0.5 10*3/uL (ref 0.1–1.0)
Monocytes Relative: 10 %
Neutro Abs: 3.7 10*3/uL (ref 1.7–7.7)
Neutrophils Relative %: 72 %
Platelets: 148 10*3/uL — ABNORMAL LOW (ref 150–400)
RBC: 3.14 MIL/uL — ABNORMAL LOW (ref 3.87–5.11)
RDW: 12.5 % (ref 11.5–15.5)
WBC: 5.1 10*3/uL (ref 4.0–10.5)
nRBC: 0 % (ref 0.0–0.2)

## 2021-08-13 LAB — MAGNESIUM: Magnesium: 2.3 mg/dL (ref 1.7–2.4)

## 2021-08-13 LAB — PHOSPHORUS: Phosphorus: 5.3 mg/dL — ABNORMAL HIGH (ref 2.5–4.6)

## 2021-08-13 NOTE — Progress Notes (Signed)
PROGRESS NOTE    [name]  STM:[id number] DOB: February 19, 1945 DOA: 08/04/2021 PCP: Lujean Amel, MD   Brief Narrative:   [name] is a 76 y.o. female with a history of chronic diastolic CHF, CAD s/p CABG, CHB s/p pacemaker who was recently on dialysis for ATN and has been off dialysis and dialysis catheter removed about 3 weeks ago, recently treated for lower extremity cellulitis who presented to the ED 6/25 with worsening dyspnea, lower extremity edema and erythema. BNP was elevated, CXR nonspecific left base opacity favored to represent atelectasis. LVEF 45-50% by echo. IV lasix was started and the patient was admitted with cardiology consultation for acute CHF. D-dimer was positive, though suspicion for PE is low. On 6/29, respiratory status had improved and diuresis is transitioned to oral lasix per cardiology but given further decompensation she is transition back to IV Lasix on 08/10/2021. She has developed acute gout in the right foot, however, which severely limits her mobility for which steroids are started and now improving.  She now complains of some difficulty swallowing and feels like "phlegm is stuck in my throat".  SLP evaluated and recommending regular diet but if necessary placing on a dysphagia 3 diet with thin liquids.   Given her volume overload she was changed back to IV Lasix and hydralazine was given for her elevated blood pressure.  Given that she had atrial tachycardia with asynchronous AV pacing the cardiologist has asked the pacemaker rep to reprogram her device to try for heart rate down.  Cardiology is concern for amyloid and ordered a myeloma panel.  Cardiology is also recommended checking orthostatic vital signs and low-dose hydralazine for blood pressure control.   Patient's dyspnea is improving but because her renal function worsened the cardiology team to discuss with nephrology for further evaluation and will see the patient later this evening and feels  that the patient has been adequately diuresed and are recommending changing the diuretics back to 160 mg of Lasix p.o. in the morning and 80 mg p.o. nightly.  The case was discussed with nephrology and they will want to watch her on her new dose and likely discharge home in the morning.  She is improving slowly and is doing well but now unfortunately she does not have a safe discharge disposition as she cannot go home with her son so we will need to figure out and disposition for this patient.  TOC to be reinvolved.  Assessment and Plan:  Acute on chronic hypoxic respiratory failure due to acute on chronic combined HFrEF:  -Pt is s/p CRT-P with improvement in LVEF with pacing and GDMT, now 45-50% -Continue lasix, transition '80mg'$  IV > PO BID the day before yesterday per cardiology but cardiology will be changed back to IV diuresis however it was never actually done and she is still on p.o. diuresis and creatinine still worsened but nephrology has now made some recommendations and recommending 160 mg of Lasix in the morning and 80 mg of p.o. Lasix in the evening., monitoring daily weights (down 101.1 > 99.2kg which is likely EDW).  -Weaned back to home 2L O2 however she continues to complain of some dyspnea -She is -9.5-9 L since admission -Continue to monitor respiratory status carefully and she did not desaturate on ambulatory home O2 screen yesterday but was more short of breath today -Per cardiology they will continue to augment her diuresis with IV diuresis with a renal function continues to worsen they will ask renal to evaluate. -Cardiology  is for amyloid given that it could be contributing to her orthostasis so myeloma panel has been sent off -She continues to be edematous but has diuresed quite well and diuretics are being continued per nephrology -Likely can be discharged in next 24 to 48 hours pending repeat PT evaluation and ambulatory home O2 screen and was to be discharged today however she  cannot go home with her son unfortunately still need to have TOC arrange a safe discharge disposition for the patient -Patient has an outpatient follow-up with Dr. [name] on 09/02/2021   Acute Gout: -Primarily affecting right 1st MTP, though diffuse pain is noted in bilateral R > L feet/ankles. Developing this morning. Not NSAID candidate, has allergies to febuxostat and colchicine.  - Start IV steroid and received a dose of IV Solu-Medrol 40 mg again today and now changed to p.o. prednisone 60 mg in anticipation for Sterapred taper - Note she was ambulating 11f w/PT previously, but unable to ambulate yesterday due to pain. Would not currently be a safe discharge to home however she improved her mobility today with PT and ambulated 60 feet with 3 seated rest breaks between with her rolling walker; she did not desaturate   Stage IV CKD Hyperphosphatemia  -Recently on HD due to ATN developing postoperatively after hip fracture and replacement, with subsequent renal recovery.  - Monitor BMP, Cr is stable. -Patient's BUNs/creatinine was improving with diuresis and went from 44/2.92  -> 43/2.69 -> 49/2.75 and is further worsened to 63/2.95 and today it is 79/3.03 so the cardiology team had consulted the nephrology team for further evaluation given that she did previously on dialysis back in March; nephrology feels that she has been adequately diuresed and recommends changing her diuretics to 160 mg the morning and 80 mg of Lasix in the evening p.o. and she remains volume overloaded but is improving -Phos Level is now 4.9 yesterday and today is now 5.3 -Cardiology following as well -Avoid further nephrotoxic medications, contrast dyes, hypotension and dehydration to ensure adequate renal perfusion and renally dose medications -Repeat CMP in a.m. and now nephrology has been consulted in house by the cardiology team   Bilateral lower leg cellulitis, improving  -Treated as such since arrival with  reported improvement. Tx with unasyn is limited by medication intolerances/allergies.  - With improvement, and diarrhea likely associated with abx, will plan to limit Tx to 5 days (last dose 6/30 AM). If needing to continue, would change to alternative agent to avoid the salt load and preferably narrow scope.    CAD s/p CABG, HLD with demand myocardial ischemia: Troponin trend is flat without anginal complaint.  - No inpatient work up indicated - Continue home DAPT, high-intensity statin (augment to rosuvastatin '40mg'$  daily), imdur -Cardiology adding metoprolol succinate and now hydralazine - Check lipid panel in next 2-3 months, LDL is 119.   Vaginal yeast infection -Start nystatin powder and consulted pharmacy for fluconazole one-time dose of 150 mg    PAF:  - 95% pacing on interrogation per cardiology. Rate largely controlled however given her atrial tachycardia with AV pacing cardiology is asked the device rep to reprogram her pacer and recommending possibly adding a little calcium channel blocker - Not on anticoagulation due to DAPT and low burden of AFib. -Per the patient it showed anemia the cardiologist has reprogrammed her device with max tracking rate of 90 heart rates now are better significantly controlled and Dr. KCaryl Comesfeels that this is contributed to her improvement of her respiratory  status   HTN, hypotension:   - Stable on home imdur, does not currently appear to require midodrine but will check her orthostatic vital signs.  -Continue monitor blood pressures per protocol; cardiology has added metoprolol succinate 25 mg p.o. daily and will uptitrate as needed -Cardiology has now added Hydralazine 10 mg q8h and will continue -Continue monitor blood pressures per protocol and last blood pressure is 139/66; she has not been using midodrine given her prednisone usage   NIDT2DM:  -HbA1c 6.3%.  - Continue SSI. Hold home medications while admitted. -Continue monitor CBGs carefully  and they are ranging from 116-188   OSA: -Nonadherent to CPAP at home -Encouraged CPAP qHS.     Elevated D-dimer -LE venous doppler negative for DVT. Clinical scenario not strongly suggestive of PE, so will stop empiric heparin as she is at risk of bleeding and declines further PE evaluation at this time.   Hypoalbuminemia -Patient's albumin level is now 3.1 -Continue to monitor and trend and repeat CMP in a.m.   Anemia of chronic kidney disease and B12 deficiency -No bleeding noted. Iron stores sufficient. - Supplement B12 IM while admitted, then PO at discharge - Supplement folic acid which is borderline.  - Monitor Hgb intermittently. -Hemoglobin/hematocrit is relatively stable and slightly improved and is now 9.6/32.6 with an MCV of 103.8 -Continue monitor for signs and symptoms of bleeding; no overt bleeding noted as above -Repeat CBC in a.m.   Thrombocytopenia -Continue to monitor for signs and symptoms of bleeding  -Platelet count went from 135 -> 130 -> 137 -> 142 -> 146 and is now 148 -Repeat CBC in a.m.   Obesity -Complicates overall prognosis and care -Estimated body mass index is 50.77 kg/m as calculated from the following:   Height as of this encounter: '5\' 3"'$  (1.6 m).   Weight as of this encounter: 130 kg.  -Weight Loss and Dietary Counseling given  DVT prophylaxis: heparin injection 5,000 Units Start: 08/07/21 1400    Code Status: Full Code Family Communication: Family currently at bedside  Disposition Plan:  Level of care: Progressive Status is: Inpatient Remains inpatient appropriate because: Was to be discharged to home with home health today however son cannot afford to take her in and out of his house so we will figure out safe discharge disposition given that she will try and go home and will need to reengage [facility]   Consultants:  Cardiology Nephrology   Procedures:  Echocardiogram IMPRESSIONS     1. Technically difficult study. Left ventricular  ejection fraction, by  estimation, is 45 to 50%. The left ventricle has mildly decreased  function. The left ventricle demonstrates regional wall motion  abnormalities (see scoring diagram/findings for  description). Apical hypokinesis. There is mild left ventricular  hypertrophy. Left ventricular diastolic parameters are indeterminate.   2. Right ventricular systolic function is normal. The right ventricular  size is normal. There is mildly elevated pulmonary artery systolic  pressure. The estimated right ventricular systolic pressure is 03.5 mmHg.   3. The mitral valve is degenerative. No evidence of mitral valve  regurgitation. Moderate mitral annular calcification.   4. The aortic valve was not well visualized. There is moderate  calcification of the aortic valve. Aortic valve regurgitation is trivial.  Mild aortic valve stenosis. Vmax 2.5 m/s, MG 13 mmHg, AVA 2.0 cm^2, DI  0.48   5. The inferior vena cava is dilated in size with <50% respiratory  variability, suggesting right atrial pressure of 15 mmHg.  FINDINGS   Left Ventricle: Left ventricular ejection fraction, by estimation, is 45  to 50%. The left ventricle has mildly decreased function. The left  ventricle demonstrates regional wall motion abnormalities. The left  ventricular internal cavity size was normal  in size. There is mild left ventricular hypertrophy. Left ventricular  diastolic parameters are indeterminate.      LV Wall Scoring:  The apical lateral segment, apical inferior segment, and apex are  hypokinetic. The entire anterior wall, antero-lateral wall, entire septum,  inferior wall, and posterior wall are normal.   Right Ventricle: The right ventricular size is normal. Right vetricular  wall thickness was not well visualized. Right ventricular systolic  function is normal. There is mildly elevated pulmonary artery systolic  pressure. The tricuspid regurgitant velocity   is 2.59 m/s, and with an assumed  right atrial pressure of 15 mmHg, the  estimated right ventricular systolic pressure is 61.9 mmHg.   Left Atrium: Left atrial size was normal in size.   Right Atrium: Right atrial size was normal in size.   Pericardium: There is no evidence of pericardial effusion.   Mitral Valve: The mitral valve is degenerative in appearance. Moderate  mitral annular calcification. No evidence of mitral valve regurgitation.   Tricuspid Valve: The tricuspid valve is normal in structure. Tricuspid  valve regurgitation is trivial.   Aortic Valve: The aortic valve was not well visualized. There is moderate  calcification of the aortic valve. Aortic valve regurgitation is trivial.  Mild aortic stenosis is present. Aortic valve mean gradient measures 11.7  mmHg. Aortic valve peak gradient   measures 22.9 mmHg. Aortic valve area, by VTI measures 2.03 cm.   Pulmonic Valve: The pulmonic valve was not well visualized. Pulmonic valve  regurgitation is trivial.   Aorta: The aortic root is normal in size and structure.   Venous: The inferior vena cava is dilated in size with less than 50%  respiratory variability, suggesting right atrial pressure of 15 mmHg.   IAS/Shunts: The interatrial septum was not well visualized.      LEFT VENTRICLE  PLAX 2D  LVIDd:         4.80 cm   Diastology  LVIDs:         3.00 cm   LV e' medial:    4.56 cm/s  LV PW:         1.20 cm   LV E/e' medial:  21.8  LV IVS:        1.10 cm   LV e' lateral:   6.75 cm/s  LVOT diam:     2.30 cm   LV E/e' lateral: 14.7  LV SV:         96  LV SV Index:   47  LVOT Area:     4.15 cm      RIGHT VENTRICLE  RV S prime:     5.68 cm/s  TAPSE (M-mode): 1.5 cm   LEFT ATRIUM             Index        RIGHT ATRIUM           Index  LA diam:        3.70 cm 1.83 cm/m   RA Area:     13.00 cm  LA Vol (A2C):   44.9 ml 22.18 ml/m  RA Volume:   26.40 ml  13.04 ml/m  LA Vol (A4C):   75.0 ml 37.04 ml/m  LA Biplane Vol: 64.2 ml  31.71 ml/m    AORTIC VALVE  AV Area (Vmax):    1.68 cm  AV Area (Vmean):   1.88 cm  AV Area (VTI):     2.03 cm  AV Vmax:           239.33 cm/s  AV Vmean:          158.333 cm/s  AV VTI:            0.473 m  AV Peak Grad:      22.9 mmHg  AV Mean Grad:      11.7 mmHg  LVOT Vmax:         97.00 cm/s  LVOT Vmean:        71.800 cm/s  LVOT VTI:          0.231 m  LVOT/AV VTI ratio: 0.49     AORTA  Ao Root diam: 3.70 cm  Ao Asc diam:  3.70 cm   MITRAL VALVE                TRICUSPID VALVE  MV Area (PHT): 4.06 cm     TR Peak grad:   26.8 mmHg  MV Decel Time: 187 msec     TR Vmax:        259.00 cm/s  MV E velocity: 99.20 cm/s  MV A velocity: 145.00 cm/s  SHUNTS  MV E/A ratio:  0.68         Systemic VTI:  0.23 m                              Systemic Diam: 2.30 cm   Antimicrobials:  Anti-infectives (From admission, onward)    Start     Dose/Rate Route Frequency Ordered Stop   08/09/21 1530  fluconazole (DIFLUCAN) tablet 150 mg        150 mg Oral  Once 08/09/21 1437 08/09/21 1711   08/05/21 1000  Ampicillin-Sulbactam (UNASYN) 3 g in sodium chloride 0.9 % 100 mL IVPB        3 g 200 mL/hr over 30 Minutes Intravenous Every 12 hours 08/04/21 2241 08/09/21 1010   08/04/21 2230  Ampicillin-Sulbactam (UNASYN) 3 g in sodium chloride 0.9 % 100 mL IVPB        3 g 200 mL/hr over 30 Minutes Intravenous  Once 08/04/21 2139 08/05/21 0700       Subjective: Seen and examined at bedside and she thinks she is doing little bit better breathing wise.  Sitting in the chair at bedside.  Has some slight abdominal discomfort.  No nausea or vomiting.  Feels okay.  No other concerns or complaints at this time but states that she cannot go home with her son because he cannot afford to take her and arrangements are being made to try and get her back into her own house.  Objective: Vitals:   08/12/21 2040 08/13/21 0435 08/13/21 0500 08/13/21 0846  BP: 140/77 137/71    Pulse: 88 66    Resp: 17 16    Temp: 98.3 F (36.8 C)  97.8 F (36.6 C)    TempSrc: Oral Oral    SpO2: 99% 100%  100%  Weight:   130 kg   Height:        Intake/Output Summary (Last 24 hours) at 08/13/2021 1115 Last data filed at 08/13/2021 0500 Gross per 24 hour  Intake 480 ml  Output 1800 ml  Net -1320 ml  Filed Weights   08/10/21 0516 08/11/21 0500 08/13/21 0500  Weight: 100.3 kg 101 kg 130 kg   Examination: Physical Exam:  Constitutional: WN/WD overweight Caucasian female currently no acute distress appears calm sitting the chair bedside Respiratory: Diminished to auscultation bilaterally with coarse breath sounds and some crackles, no wheezing, rales, rhonchi. Normal respiratory effort and patient is not tachypenic. No accessory muscle use.  Wearing supplemental oxygen via nasal cannula Cardiovascular: RRR, no murmurs / rubs / gallops. S1 and S2 auscultated.  Has 1+ lower extremity edema bilaterally  Abdomen: Soft, non-tender, distended secondary body habitus. Bowel sounds positive.  GU: Deferred. Musculoskeletal: No clubbing / cyanosis of digits/nails. No joint deformity upper and lower extremities.  Neurologic: CN 2-12 grossly intact with no focal deficits. Romberg sign and cerebellar reflexes not assessed.  Psychiatric: Normal judgment and insight. Alert and oriented x 3. Normal mood and appropriate affect.   Data Reviewed: I have personally reviewed following labs and imaging studies  CBC: Recent Labs  Lab 08/09/21 0402 08/10/21 0409 08/11/21 0421 08/12/21 0346 08/13/21 0352  WBC 5.0 4.9 4.8 4.9 5.1  NEUTROABS  --  4.3 4.0 3.8 3.7  HGB 8.9* 8.9* 9.1* 9.5* 9.6*  HCT 29.7* 28.4* 30.2* 31.2* 32.6*  MCV 104.2* 101.1* 104.1* 101.6* 103.8*  PLT 130* 137* 142* 146* 876*   Basic Metabolic Panel: Recent Labs  Lab 08/09/21 0402 08/10/21 0409 08/11/21 0421 08/12/21 0346 08/13/21 0352  NA 138 136 137 137 138  K 3.9 4.2 4.2 4.2 4.2  CL 88* 88* 88* 88* 89*  CO2 40* 38* 39* 34* 35*  GLUCOSE 165* 164* 187* 153* 145*  BUN  43* 49* 63* 72* 79*  CREATININE 2.69* 2.75* 2.95* 3.08* 3.03*  CALCIUM 8.8* 8.7* 8.7* 8.6* 8.6*  MG  --  2.0 2.4 2.2 2.3  PHOS  --  4.5 5.1* 4.9* 5.3*   GFR: Estimated Creatinine Clearance: 20.5 mL/min (A) (by C-G formula based on SCr of 3.03 mg/dL (H)). Liver Function Tests: Recent Labs  Lab 08/10/21 0409 08/11/21 0421 08/12/21 0346 08/13/21 0352  AST '19 24 23 17  '$ ALT '15 19 22 20  '$ ALKPHOS 70 81 85 89  BILITOT 0.5 0.5 0.4 0.5  PROT 5.9* 6.2* 6.3* 6.1*  ALBUMIN 2.8* 3.1* 3.2* 3.1*   No results for input(s): "LIPASE", "AMYLASE" in the last 168 hours. No results for input(s): "AMMONIA" in the last 168 hours. Coagulation Profile: No results for input(s): "INR", "PROTIME" in the last 168 hours. Cardiac Enzymes: No results for input(s): "CKTOTAL", "CKMB", "CKMBINDEX", "TROPONINI" in the last 168 hours. BNP (last 3 results) No results for input(s): "PROBNP" in the last 8760 hours. HbA1C: No results for input(s): "HGBA1C" in the last 72 hours. CBG: Recent Labs  Lab 08/12/21 0754 08/12/21 1153 08/12/21 1628 08/12/21 2056 08/13/21 0740  GLUCAP 145* 116* 188* 210* 149*   Lipid Profile: No results for input(s): "CHOL", "HDL", "LDLCALC", "TRIG", "CHOLHDL", "LDLDIRECT" in the last 72 hours. Thyroid Function Tests: No results for input(s): "TSH", "T4TOTAL", "FREET4", "T3FREE", "THYROIDAB" in the last 72 hours. Anemia Panel: No results for input(s): "VITAMINB12", "FOLATE", "FERRITIN", "TIBC", "IRON", "RETICCTPCT" in the last 72 hours. Sepsis Labs: No results for input(s): "PROCALCITON", "LATICACIDVEN" in the last 168 hours.  Recent Results (from the past 240 hour(s))  SARS Coronavirus 2 by RT PCR (hospital order, performed in [facility] hospital lab) *cepheid single result test* Anterior Nasal Swab     Status: None   Collection Time: 08/04/21  4:16 PM  Specimen: Anterior Nasal Swab  Result Value Ref Range Status   SARS Coronavirus 2 by RT PCR NEGATIVE NEGATIVE Final     Comment: (NOTE) SARS-CoV-2 target nucleic acids are NOT DETECTED.  The SARS-CoV-2 RNA is generally detectable in upper and lower respiratory specimens during the acute phase of infection. The lowest concentration of SARS-CoV-2 viral copies this assay can detect is 250 copies / mL. A negative result does not preclude SARS-CoV-2 infection and should not be used as the sole basis for treatment or other patient management decisions.  A negative result may occur with improper specimen collection / handling, submission of specimen other than nasopharyngeal swab, presence of viral mutation(s) within the areas targeted by this assay, and inadequate number of viral copies (<250 copies / mL). A negative result must be combined with clinical observations, patient history, and epidemiological information.  Fact Sheet for Patients:   https://www.patel.info/  Fact Sheet for Healthcare Providers: https://hall.com/  This test is not yet approved or  cleared by the [place] FDA and has been authorized for detection and/or diagnosis of SARS-CoV-2 by FDA under an Emergency Use Authorization (EUA).  This EUA will remain in effect (meaning this test can be used) for the duration of the COVID-19 declaration under Section 564(b)(1) of the Act, 21 U.S.C. section 360bbb-3(b)(1), unless the authorization is terminated or revoked sooner.  Performed at [facility], [place] [address] [place], [place] [address]     Radiology Studies: DG CHEST PORT 1 VIEW  Result Date: 08/13/2021 CLINICAL DATA:  Shortness of breath. EXAM: PORTABLE CHEST 1 VIEW COMPARISON:  08/12/2021 FINDINGS: 0512 hours. SIRT cardiomegaly There is pulmonary vascular congestion without overt pulmonary edema. Retrocardiac collapse/consolidation is similar to prior with probable left effusion. Right permanent pacemaker/AICD again noted. Telemetry leads overlie the chest. IMPRESSION:  1. No substantial change. 2. Cardiomegaly with pulmonary vascular congestion. 3. Retrocardiac collapse/consolidation with probable left effusion. Electronically Signed   By: Misty Stanley M.D.   On: 08/13/2021 10:32   DG CHEST PORT 1 VIEW  Result Date: 08/12/2021 CLINICAL DATA:  76 year old with history of CHF. EXAM: PORTABLE CHEST 1 VIEW COMPARISON:  August 11, 2021. FINDINGS: RIGHT-sided multi lead pacer defibrillator remains in place, power pack over the RIGHT chest entering via RIGHT subclavian approach. Post median sternotomy for CABG. EKG leads project over the chest as well. Cardiomediastinal contours and hilar structures are stable with signs of cardiomegaly. LEFT lower lobe opacification slightly increased from the prior study. No visible pneumothorax. On limited assessment no acute skeletal findings. IMPRESSION: Cardiomegaly post CABG. Increasing LEFT lower lobe opacification may reflect effusion and volume loss versus developing consolidation. Attention on follow-up. Electronically Signed   By: Zetta Bills M.D.   On: 08/12/2021 08:23    Scheduled Meds:  allopurinol  100 mg Oral q morning   aspirin EC  81 mg Oral q morning   clopidogrel  75 mg Oral QHS   cycloSPORINE  1 drop Both Eyes BID   folic acid  1 mg Oral Daily   furosemide  160 mg Oral Daily   And   furosemide  80 mg Oral QPM   heparin injection (subcutaneous)  5,000 Units Subcutaneous Q8H   hydrALAZINE  25 mg Oral Q8H   insulin aspart  0-6 Units Subcutaneous TID WC   isosorbide mononitrate  90 mg Oral Daily   metoprolol succinate  50 mg Oral Daily   mometasone-formoterol  2 puff Inhalation BID   nystatin   Topical TID  pantoprazole  40 mg Oral BID   polyethylene glycol  17 g Oral BID   predniSONE  60 mg Oral Q breakfast   rosuvastatin  40 mg Oral QPM   senna-docusate  1 tablet Oral BID   Continuous Infusions:  sodium chloride Stopped (08/05/21 1207)    LOS: 9 days   [name], DO [facility] Available  via Epic secure chat 7am-7pm After these hours, please refer to coverage provider listed on [email or web address] 08/13/2021, 11:15 AM

## 2021-08-13 NOTE — Progress Notes (Signed)
Progress Note  Patient Name: [name] Date of Encounter: 08/13/2021  [facility] Cardiologist: Sanda Klein, MD   Subjective   Denies orthopnea, but has difficulty sleeping due to frequent interruptions. Good urine output, creatinine is essentially unchanged, although BUN is higher. Unsure of "dry weight" after her prolonged serious illness.  Inpatient Medications    Scheduled Meds:  allopurinol  100 mg Oral q morning   aspirin EC  81 mg Oral q morning   clopidogrel  75 mg Oral QHS   cycloSPORINE  1 drop Both Eyes BID   folic acid  1 mg Oral Daily   furosemide  160 mg Oral Daily   And   furosemide  80 mg Oral QPM   heparin injection (subcutaneous)  5,000 Units Subcutaneous Q8H   hydrALAZINE  25 mg Oral Q8H   insulin aspart  0-6 Units Subcutaneous TID WC   isosorbide mononitrate  90 mg Oral Daily   metoprolol succinate  50 mg Oral Daily   mometasone-formoterol  2 puff Inhalation BID   nystatin   Topical TID   pantoprazole  40 mg Oral BID   polyethylene glycol  17 g Oral BID   predniSONE  60 mg Oral Q breakfast   rosuvastatin  40 mg Oral QPM   senna-docusate  1 tablet Oral BID   Continuous Infusions:  sodium chloride Stopped (08/05/21 1207)   PRN Meds: sodium chloride, acetaminophen **OR** acetaminophen, albuterol, cetaphil, guaiFENesin, ondansetron (ZOFRAN) IV, mouth rinse, polyvinyl alcohol, sodium chloride   Vital Signs    Vitals:   08/12/21 2040 08/13/21 0435 08/13/21 0500 08/13/21 0846  BP: 140/77 137/71    Pulse: 88 66    Resp: 17 16    Temp: 98.3 F (36.8 C) 97.8 F (36.6 C)    TempSrc: Oral Oral    SpO2: 99% 100%  100%  Weight:   130 kg   Height:        Intake/Output Summary (Last 24 hours) at 08/13/2021 0925 Last data filed at 08/13/2021 0500 Gross per 24 hour  Intake 480 ml  Output 1800 ml  Net -1320 ml      08/13/2021    5:00 AM 08/11/2021    5:00 AM 08/10/2021    5:16 AM  Last 3 Weights  Weight (lbs) 286 lb 9.6 oz 222 lb 10.6 oz  221 lb 1.9 oz  Weight (kg) 130 kg 101 kg 100.3 kg      Telemetry    Biventricular pacing (sometimes a tracking, often asynchronous)- Personally Reviewed  ECG    No new tracings- Personally Reviewed  Physical Exam  Morbidly obese GEN: No acute distress.   Neck: Unable to evaluate JVD Cardiac: RRR, 6-7/6 systolic murmur at the right sternal border no diastolic murmurs, rubs, or gallops.  Healthy pacemaker site Respiratory: Clear to auscultation bilaterally. GI: Soft, nontender, non-distended  MS: 3+ symmetrical ankle and pretibial soft pitting edema, but also has substantial wrinkling consistent with improving edema; no warmth/redness consistent with resolved cellulitis; No deformity. Neuro:  Nonfocal  Psych: Normal affect   Labs    High Sensitivity Troponin:   Recent Labs  Lab 08/04/21 1551 08/04/21 1800  TROPONINIHS 30* 30*     Chemistry Recent Labs  Lab 08/11/21 0421 08/12/21 0346 08/13/21 0352  NA 137 137 138  K 4.2 4.2 4.2  CL 88* 88* 89*  CO2 39* 34* 35*  GLUCOSE 187* 153* 145*  BUN 63* 72* 79*  CREATININE 2.95* 3.08* 3.03*  CALCIUM 8.7* 8.6* 8.6*  MG 2.4 2.2 2.3  PROT 6.2* 6.3* 6.1*  ALBUMIN 3.1* 3.2* 3.1*  AST '24 23 17  '$ ALT '19 22 20  '$ ALKPHOS 81 85 89  BILITOT 0.5 0.4 0.5  GFRNONAA 16* 15* 15*  ANIONGAP '10 15 14    '$ Lipids  Recent Labs  Lab 08/08/21 0412  CHOL 189  TRIG 97  HDL 51  LDLCALC 119*  CHOLHDL 3.7    Hematology Recent Labs  Lab 08/11/21 0421 08/12/21 0346 08/13/21 0352  WBC 4.8 4.9 5.1  RBC 2.90* 3.07* 3.14*  HGB 9.1* 9.5* 9.6*  HCT 30.2* 31.2* 32.6*  MCV 104.1* 101.6* 103.8*  MCH 31.4 30.9 30.6  MCHC 30.1 30.4 29.4*  RDW 12.5 12.5 12.5  PLT 142* 146* 148*   Thyroid No results for input(s): "TSH", "FREET4" in the last 168 hours.  BNP Recent Labs  Lab 08/11/21 0421  BNP 1,471.9*    DDimer No results for input(s): "DDIMER" in the last 168 hours.   Radiology    DG CHEST PORT 1 VIEW  Result Date:  08/12/2021 CLINICAL DATA:  76 year old with history of CHF. EXAM: PORTABLE CHEST 1 VIEW COMPARISON:  August 11, 2021. FINDINGS: RIGHT-sided multi lead pacer defibrillator remains in place, power pack over the RIGHT chest entering via RIGHT subclavian approach. Post median sternotomy for CABG. EKG leads project over the chest as well. Cardiomediastinal contours and hilar structures are stable with signs of cardiomegaly. LEFT lower lobe opacification slightly increased from the prior study. No visible pneumothorax. On limited assessment no acute skeletal findings. IMPRESSION: Cardiomegaly post CABG. Increasing LEFT lower lobe opacification may reflect effusion and volume loss versus developing consolidation. Attention on follow-up. Electronically Signed   By: Zetta Bills M.D.   On: 08/12/2021 08:23    Cardiac Studies   ECHO: 08/05/2021  1. Technically difficult study. Left ventricular ejection fraction, by  estimation, is 45 to 50%. The left ventricle has mildly decreased  function. The left ventricle demonstrates regional wall motion  abnormalities (see scoring diagram/findings for description). Apical hypokinesis. There is mild left ventricular hypertrophy. Left ventricular diastolic parameters are indeterminate.   2. Right ventricular systolic function is normal. The right ventricular  size is normal. There is mildly elevated pulmonary artery systolic  pressure. The estimated right ventricular systolic pressure is 00.1 mmHg.   3. The mitral valve is degenerative. No evidence of mitral valve  regurgitation. Moderate mitral annular calcification.   4. The aortic valve was not well visualized. There is moderate  calcification of the aortic valve. Aortic valve regurgitation is trivial.  Mild aortic valve stenosis. Vmax 2.5 m/s, MG 13 mmHg, AVA 2.0 cm^2, DI 0.48   5. The inferior vena cava is dilated in size with <50% respiratory  variability, suggesting right atrial pressure of 15 mmHg.    CARDIAC  CATH 03/11/2019        Ost 1st Diag lesion is 60% stenosed. Ost 2nd Diag lesion is 50% stenosed. Mid LAD lesion is 100% stenosed. Non-stenotic Ost Cx to Mid Cx lesion was previously treated. Prox RCA lesion is 100% stenosed. Non-stenotic Ost Ramus to Ramus lesion was previously treated. Origin to Prox Graft lesion before Ramus is 100% stenosed.   Findings:   Ao = 136/68 (95)  LV =  155/11 RA =  7 RV = 34/9 PA = 34/17 (24) PCW = 14 Fick cardiac output/index = 6.8/3.1 PVR = 1.5 WU FA sat = 96% PA sat = 65%, 75%, 69%  Assessment: 1. Stable coronary anatomy with no change from previous. SVG to OM system chronically occluded with previous PCI of native LCX system. Remainder of grafts are widely patent 2. Very mild PAH with normal left-sided pressures. 3. Very difficult procedure given patient's size and fidgeting on cath table Diagnostic Dominance: Right      Patient Profile     76 y.o. female with CAD s/p CABG, chronic systolic and diastolic heart failure, O2 dependent COPD -morbid obesity, untreated OSA, DM, CHB s/p CRT-P (RA lead dysfunction), PAF, iatrogenic adrenal insufficiency, hypertension, CKD 4 admitted with acute on chronic diastolic heart failure in the setting of lower extremity cellulitis    Assessment & Plan    Continues to have excellent diuresis without significant change in her creatinine, but BUN is starting to increase more. Still has evidence of hypervolemia, but kidney problems limited to the speed at which we can provide diuresis. We will need to reassess her "dry weight" since she is probably lost a large amount of true weight during her recent multiple hospitalizations/critical illness. On high-dose steroids for gout which may be masking her adrenal insufficiency and explains why she no longer needs midodrine. I think she should remain on the current dose of diuretics and will reevaluate at her office appointment on 09/02/2021. I think at this point  hospitalization versus discharge depends on her ability to self-care, since she is returning home.  It seems that she has made progress with physical therapy, able to rise from a recliner and walk sufficient distance, but has difficulty transferring out of bed without assistance.     For questions or updates, please contact [facility] Please consult [email or web address] for contact info under        Signed, Sanda Klein, MD  08/13/2021, 9:25 AM

## 2021-08-13 NOTE — Progress Notes (Signed)
[name] has been diuresing well with oral lasix at 160 mg qam and 80 mg qpm.  Scr stable.  Stable for discharge from renal standpoint, however she tells me she needs to set her home up before she can leave.  Will have her follow up with Dr. Joelyn Oms after discharge.

## 2021-08-14 ENCOUNTER — Inpatient Hospital Stay (HOSPITAL_COMMUNITY): Payer: Medicare Other

## 2021-08-14 DIAGNOSIS — I5041 Acute combined systolic (congestive) and diastolic (congestive) heart failure: Secondary | ICD-10-CM | POA: Diagnosis not present

## 2021-08-14 DIAGNOSIS — N189 Chronic kidney disease, unspecified: Secondary | ICD-10-CM | POA: Diagnosis not present

## 2021-08-14 DIAGNOSIS — N179 Acute kidney failure, unspecified: Secondary | ICD-10-CM | POA: Diagnosis not present

## 2021-08-14 DIAGNOSIS — I5031 Acute diastolic (congestive) heart failure: Secondary | ICD-10-CM | POA: Diagnosis not present

## 2021-08-14 DIAGNOSIS — I2510 Atherosclerotic heart disease of native coronary artery without angina pectoris: Secondary | ICD-10-CM | POA: Diagnosis not present

## 2021-08-14 LAB — COMPREHENSIVE METABOLIC PANEL
ALT: 16 U/L (ref 0–44)
AST: 15 U/L (ref 15–41)
Albumin: 3 g/dL — ABNORMAL LOW (ref 3.5–5.0)
Alkaline Phosphatase: 86 U/L (ref 38–126)
Anion gap: 11 (ref 5–15)
BUN: 85 mg/dL — ABNORMAL HIGH (ref 8–23)
CO2: 38 mmol/L — ABNORMAL HIGH (ref 22–32)
Calcium: 8.7 mg/dL — ABNORMAL LOW (ref 8.9–10.3)
Chloride: 89 mmol/L — ABNORMAL LOW (ref 98–111)
Creatinine, Ser: 3.13 mg/dL — ABNORMAL HIGH (ref 0.44–1.00)
GFR, Estimated: 15 mL/min — ABNORMAL LOW (ref >60)
Glucose, Bld: 137 mg/dL — ABNORMAL HIGH (ref 70–99)
Potassium: 4 mmol/L (ref 3.5–5.1)
Sodium: 138 mmol/L (ref 135–145)
Total Bilirubin: 0.4 mg/dL (ref 0.3–1.2)
Total Protein: 6 g/dL — ABNORMAL LOW (ref 6.5–8.1)

## 2021-08-14 LAB — CBC WITH DIFFERENTIAL/PLATELET
Abs Immature Granulocytes: 0.1 10*3/uL — ABNORMAL HIGH (ref 0.00–0.07)
Basophils Absolute: 0 10*3/uL (ref 0.0–0.1)
Basophils Relative: 0 %
Eosinophils Absolute: 0 10*3/uL (ref 0.0–0.5)
Eosinophils Relative: 0 %
HCT: 30.7 % — ABNORMAL LOW (ref 36.0–46.0)
Hemoglobin: 9.3 g/dL — ABNORMAL LOW (ref 12.0–15.0)
Immature Granulocytes: 2 %
Lymphocytes Relative: 15 %
Lymphs Abs: 0.9 10*3/uL (ref 0.7–4.0)
MCH: 30.9 pg (ref 26.0–34.0)
MCHC: 30.3 g/dL (ref 30.0–36.0)
MCV: 102 fL — ABNORMAL HIGH (ref 80.0–100.0)
Monocytes Absolute: 0.6 10*3/uL (ref 0.1–1.0)
Monocytes Relative: 10 %
Neutro Abs: 4.2 10*3/uL (ref 1.7–7.7)
Neutrophils Relative %: 73 %
Platelets: 163 10*3/uL (ref 150–400)
RBC: 3.01 MIL/uL — ABNORMAL LOW (ref 3.87–5.11)
RDW: 12.6 % (ref 11.5–15.5)
WBC: 5.8 10*3/uL (ref 4.0–10.5)
nRBC: 0 % (ref 0.0–0.2)

## 2021-08-14 LAB — PHOSPHORUS: Phosphorus: 5.1 mg/dL — ABNORMAL HIGH (ref 2.5–4.6)

## 2021-08-14 LAB — GLUCOSE, CAPILLARY
Glucose-Capillary: 131 mg/dL — ABNORMAL HIGH (ref 70–99)
Glucose-Capillary: 149 mg/dL — ABNORMAL HIGH (ref 70–99)
Glucose-Capillary: 247 mg/dL — ABNORMAL HIGH (ref 70–99)
Glucose-Capillary: 227 mg/dL — ABNORMAL HIGH (ref 70–99)

## 2021-08-14 LAB — MAGNESIUM: Magnesium: 2.3 mg/dL (ref 1.7–2.4)

## 2021-08-14 NOTE — Progress Notes (Signed)
Progress Note  Patient Name: [name] Date of Encounter: 08/14/2021  [facility] Cardiologist: Sanda Klein, MD   Subjective    Continues to have excellent diuresis (additional net diuresis of 1.6 L in last 24 hours, net 10.7 L since admission), but BUN has increased substantially to 85 and creatinine a little worse at 3.13. Her weight yesterday was clearly erroneously recorded at 130 kg. Otherwise her weight has been remarkably unchanged during this hospitalization at 100-101 kg.  Inpatient Medications    Scheduled Meds:  allopurinol  100 mg Oral q morning   aspirin EC  81 mg Oral q morning   clopidogrel  75 mg Oral QHS   cycloSPORINE  1 drop Both Eyes BID   folic acid  1 mg Oral Daily   furosemide  160 mg Oral Daily   heparin injection (subcutaneous)  5,000 Units Subcutaneous Q8H   hydrALAZINE  25 mg Oral Q8H   insulin aspart  0-6 Units Subcutaneous TID WC   isosorbide mononitrate  90 mg Oral Daily   metoprolol succinate  50 mg Oral Daily   mometasone-formoterol  2 puff Inhalation BID   nystatin   Topical TID   pantoprazole  40 mg Oral BID   polyethylene glycol  17 g Oral BID   predniSONE  60 mg Oral Q breakfast   rosuvastatin  40 mg Oral QPM   senna-docusate  1 tablet Oral BID   Continuous Infusions:  sodium chloride Stopped (08/05/21 1207)   PRN Meds: sodium chloride, acetaminophen **OR** acetaminophen, albuterol, cetaphil, guaiFENesin, ondansetron (ZOFRAN) IV, mouth rinse, polyvinyl alcohol, sodium chloride   Vital Signs    Vitals:   08/13/21 2057 08/14/21 0443 08/14/21 0500 08/14/21 0852  BP:  137/77 137/77   Pulse:  86 68   Resp:  18 19   Temp:  98.1 F (36.7 C) 98 F (36.7 C)   TempSrc:  Oral Oral   SpO2: 100% 98% 98% 97%  Weight:   101.1 kg   Height:        Intake/Output Summary (Last 24 hours) at 08/14/2021 1331 Last data filed at 08/14/2021 1100 Gross per 24 hour  Intake 120 ml  Output 1850 ml  Net -1730 ml      08/14/2021     5:00 AM 08/13/2021    5:00 AM 08/11/2021    5:00 AM  Last 3 Weights  Weight (lbs) 222 lb 14.2 oz 286 lb 9.6 oz 222 lb 10.6 oz  Weight (kg) 101.1 kg 130 kg 101 kg      Telemetry    Biventricular paced rhythm, occasionally demand pacing, often asynchronous due to ectopy- Personally Reviewed  ECG    No new tracing- Personally Reviewed  Physical Exam  Morbidly obese GEN: No acute distress.   Neck: Unable to see JVD Cardiac: RRR baseline with frequent irregularity, no murmurs, rubs, or gallops.  Respiratory: Clear to auscultation bilaterally. GI: Soft, nontender, non-distended  MS: Symmetrical 2-3+ soft pitting pretibial and ankle edema; No deformity. Neuro:  Nonfocal  Psych: Normal affect   Labs    High Sensitivity Troponin:   Recent Labs  Lab 08/04/21 1551 08/04/21 1800  TROPONINIHS 30* 30*     Chemistry Recent Labs  Lab 08/12/21 0346 08/13/21 0352 08/14/21 0400  NA 137 138 138  K 4.2 4.2 4.0  CL 88* 89* 89*  CO2 34* 35* 38*  GLUCOSE 153* 145* 137*  BUN 72* 79* 85*  CREATININE 3.08* 3.03* 3.13*  CALCIUM 8.6*  8.6* 8.7*  MG 2.2 2.3 2.3  PROT 6.3* 6.1* 6.0*  ALBUMIN 3.2* 3.1* 3.0*  AST '23 17 15  '$ ALT '22 20 16  '$ ALKPHOS 85 89 86  BILITOT 0.4 0.5 0.4  GFRNONAA 15* 15* 15*  ANIONGAP '15 14 11    '$ Lipids  Recent Labs  Lab 08/08/21 0412  CHOL 189  TRIG 97  HDL 51  LDLCALC 119*  CHOLHDL 3.7    Hematology Recent Labs  Lab 08/12/21 0346 08/13/21 0352 08/14/21 0400  WBC 4.9 5.1 5.8  RBC 3.07* 3.14* 3.01*  HGB 9.5* 9.6* 9.3*  HCT 31.2* 32.6* 30.7*  MCV 101.6* 103.8* 102.0*  MCH 30.9 30.6 30.9  MCHC 30.4 29.4* 30.3  RDW 12.5 12.5 12.6  PLT 146* 148* 163   Thyroid No results for input(s): "TSH", "FREET4" in the last 168 hours.  BNP Recent Labs  Lab 08/11/21 0421  BNP 1,471.9*    DDimer No results for input(s): "DDIMER" in the last 168 hours.   Radiology    DG CHEST PORT 1 VIEW  Result Date: 08/14/2021 CLINICAL DATA:  Shortness of breath. EXAM:  PORTABLE CHEST 1 VIEW COMPARISON:  08/13/2021 and 08/12/2021 radiographs.  CT 08/18/2013. FINDINGS: 0448 hours. The right subclavian AICD leads appear unchanged. The heart size and mediastinal contours are stable status post median sternotomy and CABG. Unchanged retrocardiac opacity, small left pleural effusion and vascular congestion. No evidence of pneumothorax or acute osseous abnormality. IMPRESSION: Unchanged appearance of the chest. Electronically Signed   By: Richardean Sale M.D.   On: 08/14/2021 08:05   DG CHEST PORT 1 VIEW  Result Date: 08/13/2021 CLINICAL DATA:  Shortness of breath. EXAM: PORTABLE CHEST 1 VIEW COMPARISON:  08/12/2021 FINDINGS: 0512 hours. SIRT cardiomegaly There is pulmonary vascular congestion without overt pulmonary edema. Retrocardiac collapse/consolidation is similar to prior with probable left effusion. Right permanent pacemaker/AICD again noted. Telemetry leads overlie the chest. IMPRESSION: 1. No substantial change. 2. Cardiomegaly with pulmonary vascular congestion. 3. Retrocardiac collapse/consolidation with probable left effusion. Electronically Signed   By: Misty Stanley M.D.   On: 08/13/2021 10:32    Cardiac Studies   ECHO: 08/05/2021  1. Technically difficult study. Left ventricular ejection fraction, by  estimation, is 45 to 50%. The left ventricle has mildly decreased  function. The left ventricle demonstrates regional wall motion  abnormalities (see scoring diagram/findings for description). Apical hypokinesis. There is mild left ventricular hypertrophy. Left ventricular diastolic parameters are indeterminate.   2. Right ventricular systolic function is normal. The right ventricular  size is normal. There is mildly elevated pulmonary artery systolic  pressure. The estimated right ventricular systolic pressure is 22.2 mmHg.   3. The mitral valve is degenerative. No evidence of mitral valve  regurgitation. Moderate mitral annular calcification.   4. The aortic  valve was not well visualized. There is moderate  calcification of the aortic valve. Aortic valve regurgitation is trivial.  Mild aortic valve stenosis. Vmax 2.5 m/s, MG 13 mmHg, AVA 2.0 cm^2, DI 0.48   5. The inferior vena cava is dilated in size with <50% respiratory  variability, suggesting right atrial pressure of 15 mmHg.    CARDIAC CATH 03/11/2019        Ost 1st Diag lesion is 60% stenosed. Ost 2nd Diag lesion is 50% stenosed. Mid LAD lesion is 100% stenosed. Non-stenotic Ost Cx to Mid Cx lesion was previously treated. Prox RCA lesion is 100% stenosed. Non-stenotic Ost Ramus to Ramus lesion was previously treated. Origin to Prox Graft  lesion before Ramus is 100% stenosed.   Findings:   Ao = 136/68 (95)  LV =  155/11 RA =  7 RV = 34/9 PA = 34/17 (24) PCW = 14 Fick cardiac output/index = 6.8/3.1 PVR = 1.5 WU FA sat = 96% PA sat = 65%, 75%, 69%   Assessment: 1. Stable coronary anatomy with no change from previous. SVG to OM system chronically occluded with previous PCI of native LCX system. Remainder of grafts are widely patent 2. Very mild PAH with normal left-sided pressures. 3. Very difficult procedure given patient's size and fidgeting on cath table Diagnostic Dominance: Right    Patient Profile     76 y.o. female with CAD s/p CABG, chronic systolic and diastolic heart failure, O2 dependent COPD -morbid obesity, untreated OSA, DM, CHB s/p CRT-P (RA lead dysfunction), PAF, iatrogenic adrenal insufficiency, hypertension, CKD 4 admitted with acute on chronic diastolic heart failure in the setting of lower extremity cellulitis  Assessment & Plan    We will decrease the dose of diuretic to 160 mg once daily in the morning and repeat labs tomorrow.  I am concerned about the steady increase in BUN.  Not sure about the accuracy of weights recorded.  Her edema is clearly improved.  X-ray does not show overt fluid overload, other than an unchanged small left pleural  effusion. She is otherwise close to discharge, but will need repeated assessment of renal function parameters and adjustment of diuretic dose as an outpatient. We are making arrangements for home health.  Would like to have a basic metabolic panel drawn either Monday or Tuesday of next week.     For questions or updates, please contact [place] Please consult [email or web address] for contact info under        Signed, Sanda Klein, MD  08/14/2021, 1:31 PM

## 2021-08-14 NOTE — Progress Notes (Signed)
PROGRESS NOTE  [name] KXF:[id number] DOB: 23-Jun-1945 DOA: 08/04/2021 PCP: Lujean Amel, MD   LOS: 10 days   Brief Narrative / Interim history: [name] is a 76 y.o. female with a history of chronic diastolic CHF, CAD s/p CABG, CHB s/p pacemaker who was recently on dialysis for ATN and has been off dialysis and dialysis catheter removed about 3 weeks ago, recently treated for lower extremity cellulitis who presented to the ED 6/25 with worsening dyspnea, lower extremity edema and erythema.  She was started on IV diuresis and cardiology was consulted.  Subjective / 24h Interval events: Doing well this morning, tells me she cannot go home since house is not ready for her.  Son is working to get her room arranged.  Assesement and Plan: Principal Problem:   Acute CHF (congestive heart failure) (HCC) Active Problems:   OSA (obstructive sleep apnea)   CAD S/P percutaneous coronary angioplasty   S/P CABG x 5   ESRD (end stage renal disease) (HCC)   Acute combined systolic and diastolic heart failure (HCC)   Principal problem Acute on chronic hypoxic respiratory failure due to acute on chronic combined CHF -cardiology consulted and followed patient while hospitalized.  She was placed on IV Lasix with excellent results, now changed to p.o. furosemide 160 mg in the morning and 80 in the evening.  She seems to be tolerating this well with good urine output.  Renal function fairly stable. Nephrology also consulted and followed patient while hospitalized.  Her respiratory status improved, she is back to her home 2 L of oxygen, and will be discharged home in stable condition and will have follow-up as an outpatient.  Active problems Acute Gout -Primarily affecting right 1st MTP, though diffuse pain is noted in bilateral R > L feet/ankles.  Improving, was placed on steroids.  We will do a taper as an outpatient   Stage IV CKD, hyperphosphatemia -Recently on HD due to ATN developing  postoperatively after hip fracture and replacement, with subsequent renal recovery.  Overall creatinine is stable.  Appreciate nephrology consult, will follow-up as an outpatient  Bilateral lower leg cellulitis, improving -status post 5 days of treatment   CAD s/p CABG, HLD with demand myocardial ischemia-flat troponin, consistent with demand ischemia   Vaginal yeast infection -s/p fluconazole   PAF- 95% pacing on interrogation per cardiology. Not on anticoagulation due to DAPT and low burden of AFib.    HTN, hypotension-stable, tolerating Imdur, Toprol, diuretics   NIDT2DM-HbA1c 6.3%.    OSA-Nonadherent to CPAP at home   Elevated D-dimer-LE venous doppler negative for DVT. Clinical scenario not strongly suggestive of PE, so will stop empiric heparin as she is at risk of bleeding and declines further PE evaluation at this time.     Anemia of chronic kidney disease and B12 deficiency -No bleeding noted. Iron stores sufficient.  Supplement B12 IM while admitted, then PO at discharge. Supplement folic acid which is borderline.    Thrombocytopenia -platelets have now normalized   Obesity -Complicates overall prognosis and care.  BMI 50.  She would benefit from weight loss  Scheduled Meds:  allopurinol  100 mg Oral q morning   aspirin EC  81 mg Oral q morning   clopidogrel  75 mg Oral QHS   cycloSPORINE  1 drop Both Eyes BID   folic acid  1 mg Oral Daily   furosemide  160 mg Oral Daily   And   furosemide  80 mg Oral QPM  heparin injection (subcutaneous)  5,000 Units Subcutaneous Q8H   hydrALAZINE  25 mg Oral Q8H   insulin aspart  0-6 Units Subcutaneous TID WC   isosorbide mononitrate  90 mg Oral Daily   metoprolol succinate  50 mg Oral Daily   mometasone-formoterol  2 puff Inhalation BID   nystatin   Topical TID   pantoprazole  40 mg Oral BID   polyethylene glycol  17 g Oral BID   predniSONE  60 mg Oral Q breakfast   rosuvastatin  40 mg Oral QPM   senna-docusate  1 tablet Oral  BID   Continuous Infusions:  sodium chloride Stopped (08/05/21 1207)   PRN Meds:.sodium chloride, acetaminophen **OR** acetaminophen, albuterol, cetaphil, guaiFENesin, ondansetron (ZOFRAN) IV, mouth rinse, polyvinyl alcohol, sodium chloride  Diet Orders (From admission, onward)     Start     Ordered   08/04/21 2107  Diet renal/carb modified with fluid restriction Diet-HS Snack? Nothing; Fluid restriction: 1200 mL Fluid; Room service appropriate? Yes; Fluid consistency: Thin  Diet effective now       Question Answer Comment  Diet-HS Snack? Nothing   Fluid restriction: 1200 mL Fluid   Room service appropriate? Yes   Fluid consistency: Thin      08/04/21 2109            DVT prophylaxis: heparin injection 5,000 Units Start: 08/07/21 1400   Lab Results  Component Value Date   PLT 163 08/14/2021      Code Status: Full Code  Family Communication: no family at bedside  Status is: Inpatient  Remains inpatient appropriate because: unsafe dc  Level of care: Progressive  Consultants:  Cardiology  Nephrology  Objective: Vitals:   08/13/21 2057 08/14/21 0443 08/14/21 0500 08/14/21 0852  BP:  137/77 137/77   Pulse:  86 68   Resp:  18 19   Temp:  98.1 F (36.7 C) 98 F (36.7 C)   TempSrc:  Oral Oral   SpO2: 100% 98% 98% 97%  Weight:   101.1 kg   Height:        Intake/Output Summary (Last 24 hours) at 08/14/2021 1308 Last data filed at 08/14/2021 1100 Gross per 24 hour  Intake 120 ml  Output 1850 ml  Net -1730 ml   Wt Readings from Last 3 Encounters:  08/14/21 101.1 kg  06/14/21 104.3 kg  06/11/21 98 kg    Examination:  Constitutional: NAD Eyes: no scleral icterus ENMT: Mucous membranes are moist.  Neck: normal, supple Respiratory: clear to auscultation bilaterally, no wheezing, no crackles. Normal respiratory effort. No accessory muscle use.  Cardiovascular: Regular rate and rhythm, no murmurs / rubs / gallops.  Abdomen: non distended, no tenderness.  Bowel sounds positive.  Musculoskeletal: no clubbing / cyanosis.  Skin: no rashes, chronic venous stasis changes bilateral lower extremities Neurologic: non focal   Data Reviewed: I have independently reviewed following labs and imaging studies   CBC Recent Labs  Lab 08/10/21 0409 08/11/21 0421 08/12/21 0346 08/13/21 0352 08/14/21 0400  WBC 4.9 4.8 4.9 5.1 5.8  HGB 8.9* 9.1* 9.5* 9.6* 9.3*  HCT 28.4* 30.2* 31.2* 32.6* 30.7*  PLT 137* 142* 146* 148* 163  MCV 101.1* 104.1* 101.6* 103.8* 102.0*  MCH 31.7 31.4 30.9 30.6 30.9  MCHC 31.3 30.1 30.4 29.4* 30.3  RDW 12.4 12.5 12.5 12.5 12.6  LYMPHSABS 0.4* 0.5* 0.7 0.8 0.9  MONOABS 0.2 0.2 0.4 0.5 0.6  EOSABS 0.0 0.0 0.0 0.0 0.0  BASOSABS 0.0 0.0 0.0 0.0 0.0  Recent Labs  Lab 08/10/21 0409 08/11/21 0421 08/12/21 0346 08/13/21 0352 08/14/21 0400  NA 136 137 137 138 138  K 4.2 4.2 4.2 4.2 4.0  CL 88* 88* 88* 89* 89*  CO2 38* 39* 34* 35* 38*  GLUCOSE 164* 187* 153* 145* 137*  BUN 49* 63* 72* 79* 85*  CREATININE 2.75* 2.95* 3.08* 3.03* 3.13*  CALCIUM 8.7* 8.7* 8.6* 8.6* 8.7*  AST '19 24 23 17 15  '$ ALT '15 19 22 20 16  '$ ALKPHOS 70 81 85 89 86  BILITOT 0.5 0.5 0.4 0.5 0.4  ALBUMIN 2.8* 3.1* 3.2* 3.1* 3.0*  MG 2.0 2.4 2.2 2.3 2.3  BNP  --  1,471.9*  --   --   --     ------------------------------------------------------------------------------------------------------------------ No results for input(s): "CHOL", "HDL", "LDLCALC", "TRIG", "CHOLHDL", "LDLDIRECT" in the last 72 hours.  Lab Results  Component Value Date   HGBA1C 6.3 (H) 04/04/2021   ------------------------------------------------------------------------------------------------------------------ No results for input(s): "TSH", "T4TOTAL", "T3FREE", "THYROIDAB" in the last 72 hours.  Invalid input(s): "FREET3"  Cardiac Enzymes No results for input(s): "CKMB", "TROPONINI", "MYOGLOBIN" in the last 168 hours.  Invalid input(s):  "CK" ------------------------------------------------------------------------------------------------------------------    Component Value Date/Time   BNP 1,471.9 (H) 08/11/2021 0421   BNP 55.7 10/18/2015 1204    CBG: Recent Labs  Lab 08/13/21 1138 08/13/21 1625 08/13/21 2056 08/14/21 0732 08/14/21 1151  GLUCAP 143* 219* 202* 131* 149*    Recent Results (from the past 240 hour(s))  SARS Coronavirus 2 by RT PCR (hospital order, performed in [facility] hospital lab) *cepheid single result test* Anterior Nasal Swab     Status: None   Collection Time: 08/04/21  4:16 PM   Specimen: Anterior Nasal Swab  Result Value Ref Range Status   SARS Coronavirus 2 by RT PCR NEGATIVE NEGATIVE Final    Comment: (NOTE) SARS-CoV-2 target nucleic acids are NOT DETECTED.  The SARS-CoV-2 RNA is generally detectable in upper and lower respiratory specimens during the acute phase of infection. The lowest concentration of SARS-CoV-2 viral copies this assay can detect is 250 copies / mL. A negative result does not preclude SARS-CoV-2 infection and should not be used as the sole basis for treatment or other patient management decisions.  A negative result may occur with improper specimen collection / handling, submission of specimen other than nasopharyngeal swab, presence of viral mutation(s) within the areas targeted by this assay, and inadequate number of viral copies (<250 copies / mL). A negative result must be combined with clinical observations, patient history, and epidemiological information.  Fact Sheet for Patients:   https://www.patel.info/  Fact Sheet for Healthcare Providers: https://hall.com/  This test is not yet approved or  cleared by the [place] FDA and has been authorized for detection and/or diagnosis of SARS-CoV-2 by FDA under an Emergency Use Authorization (EUA).  This EUA will remain in effect (meaning this test can be used)  for the duration of the COVID-19 declaration under Section 564(b)(1) of the Act, 21 U.S.C. section 360bbb-3(b)(1), unless the authorization is terminated or revoked sooner.  Performed at [facility], [place] [address] [place], [place] [address]      Radiology Studies: DG CHEST PORT 1 VIEW  Result Date: 08/14/2021 CLINICAL DATA:  Shortness of breath. EXAM: PORTABLE CHEST 1 VIEW COMPARISON:  08/13/2021 and 08/12/2021 radiographs.  CT 08/18/2013. FINDINGS: 0448 hours. The right subclavian AICD leads appear unchanged. The heart size and mediastinal contours are stable status post median sternotomy and CABG. Unchanged  retrocardiac opacity, small left pleural effusion and vascular congestion. No evidence of pneumothorax or acute osseous abnormality. IMPRESSION: Unchanged appearance of the chest. Electronically Signed   By: Richardean Sale M.D.   On: 08/14/2021 08:05     Marzetta Board, MD, PhD [facility]  Between 7 am - 7 pm I am available, please contact me via Amion (for emergencies) or Securechat (non urgent messages)  Between 7 pm - 7 am I am not available, please contact night coverage MD/APP via Amion

## 2021-08-14 NOTE — Progress Notes (Signed)
Physical Therapy Treatment Patient Details Name: [name] MRN: [id number] DOB: [birth year] Today's Date: 08/14/2021   History of Present Illness 76 year old female presenting to ED experiencing increasing shortness of breath for the last 3 days with pleuritic chest pain no fever chills or productive cough. In the ER patient has significant edema of the lower extremity and also erythema of both lower extremities. Chest x-ray shows nonspecific finding for concerning for opacity: "IMPRESSION: Increased opacity in the left base may represent atelectasis or developing infiltrate/pneumonia".  D-dimer was elevated.  Venous dopplars negative for evidence of DVTs. Medical history of chronic diastolic CHF, CAD status post CABG, complete heart block status post pacemaker placement was recently on dialysis and has been off dialysis and dialysis catheter removed about 3 weeks ago recently treated for lower extremity cellulitis. Pt seen on 04/03/21 after a fall with LT hip and pelvis fractures.    PT Comments    Pt is AxO x 3 very pleasant and willing.  C/O back itching.  Assisted OOB to amb to bathroom.  General bed mobility comments: pt self able with use of rails.  "I will have a hospital bed at home".  "Right now it's at my Son's". General transfer comment: pt self able to rise from bed, toilet and recliner with use B UE's. Assisted with a wash up and instructed on energy conservation tech seated during most of time.  General Gait Details: pt tolerated amb around room as well as to and from bathroom with her walker on 2 lts.  Did required seated rest breaks between each activity.  Sats ranged from 88 - 94%.  2/4 dyspnea.  Pt now plans to D/C to her home vs Son's.  Declines SNF, "not doing that again" stated pt.  Son is moving her recliner chair and hospital bed back to her home today as well as setting up her home for easy access.   Pt hopes to be able to D/C to home tomorrow. Pt has all equipment but will  need HH PT at her home.    Recommendations for follow up therapy are one component of a multi-disciplinary discharge planning process, led by the attending physician.  Recommendations may be updated based on patient status, additional functional criteria and insurance authorization.  Follow Up Recommendations  Home health PT     Assistance Recommended at Discharge Intermittent Supervision/Assistance  Patient can return home with the following A little help with walking and/or transfers;A little help with bathing/dressing/bathroom;Assistance with cooking/housework;Direct supervision/assist for medications management;Direct supervision/assist for financial management;Assist for transportation;Help with stairs or ramp for entrance   Equipment Recommendations  None recommended by PT    Recommendations for Other Services       Precautions / Restrictions Precautions Precautions: Fall Precaution Comments: Chronic Home oxygen 2 lts Restrictions Weight Bearing Restrictions: No     Mobility  Bed Mobility Overal bed mobility: Needs Assistance Bed Mobility: Supine to Sit     Supine to sit: Supervision     General bed mobility comments: pt self able with use of rails.  "I will have a hospital bed at home".  "Right now it's at my Son's".    Transfers Overall transfer level: Needs assistance Equipment used: Rolling walker (2 wheels) Transfers: Sit to/from Stand Sit to Stand: Supervision           General transfer comment: pt self able to rise from bed, toilet and recliner with use B UE's.    Ambulation/Gait Ambulation/Gait assistance: Supervision  Gait Distance (Feet): 25 Feet Assistive device: Rolling walker (2 wheels) Gait Pattern/deviations: Step-through pattern, Decreased stride length, Decreased step length - left, Decreased step length - right, Trunk flexed, Wide base of support Gait velocity: decr     General Gait Details: pt tolerated amb around room as well as to  and from bathroom with her walker on 2 lts.  Did required seated rest breaks between each activity.  Sats ranged from 88 - 94%.  2/4 dyspnea.   Stairs             Wheelchair Mobility    Modified Rankin (Stroke Patients Only)       Balance                                            Cognition Arousal/Alertness: Awake/alert Behavior During Therapy: WFL for tasks assessed/performed Overall Cognitive Status: Within Functional Limits for tasks assessed                                 General Comments: AxO x 3 very pleasant Lady who plans to D/C back her house with family support.        Exercises      General Comments        Pertinent Vitals/Pain Pain Assessment Pain Assessment: No/denies pain    Home Living                          Prior Function            PT Goals (current goals can now be found in the care plan section) Progress towards PT goals: Progressing toward goals    Frequency    Min 3X/week      PT Plan Current plan remains appropriate    Co-evaluation              AM-PAC PT "6 Clicks" Mobility   Outcome Measure  Help needed turning from your back to your side while in a flat bed without using bedrails?: A Little Help needed moving from lying on your back to sitting on the side of a flat bed without using bedrails?: A Little Help needed moving to and from a bed to a chair (including a wheelchair)?: A Little Help needed standing up from a chair using your arms (e.g., wheelchair or bedside chair)?: A Little Help needed to walk in hospital room?: A Little Help needed climbing 3-5 steps with a railing? : A Lot 6 Click Score: 17    End of Session Equipment Utilized During Treatment: Gait belt;Oxygen Activity Tolerance: Patient tolerated treatment well Patient left: in chair;with call bell/phone within reach;with chair alarm set Nurse Communication: Mobility status PT Visit Diagnosis:  Muscle weakness (generalized) (M62.81);Difficulty in walking, not elsewhere classified (R26.2);Unsteadiness on feet (R26.81)     Time: 4034-7425 PT Time Calculation (min) (ACUTE ONLY): 42 min  Charges:  $Gait Training: 8-22 mins $Therapeutic Activity: 8-22 mins $Self Care/Home Management: [place]  PTA [place] Office M-F          [phone] Weekend pager [phone]

## 2021-08-14 NOTE — Plan of Care (Signed)
  Problem: Education: Goal: Knowledge of General Education information will improve Description: Including pain rating scale, medication(s)/side effects and non-pharmacologic comfort measures Outcome: Progressing   Problem: Health Behavior/Discharge Planning: Goal: Ability to manage health-related needs will improve Outcome: Progressing   Problem: Clinical Measurements: Goal: Ability to maintain clinical measurements within normal limits will improve Outcome: Progressing Goal: Will remain free from infection Outcome: Progressing Goal: Diagnostic test results will improve Outcome: Progressing Goal: Respiratory complications will improve Outcome: Progressing Goal: Cardiovascular complication will be avoided Outcome: Progressing   Problem: Activity: Goal: Risk for activity intolerance will decrease Outcome: Progressing   Problem: Nutrition: Goal: Adequate nutrition will be maintained Outcome: Progressing   Problem: Coping: Goal: Level of anxiety will decrease Outcome: Progressing   Problem: Elimination: Goal: Will not experience complications related to bowel motility Outcome: Progressing Goal: Will not experience complications related to urinary retention Outcome: Progressing   Problem: Pain Managment: Goal: General experience of comfort will improve Outcome: Progressing   Problem: Safety: Goal: Ability to remain free from injury will improve Outcome: Progressing   Problem: Skin Integrity: Goal: Risk for impaired skin integrity will decrease Outcome: Progressing   Problem: Education: Goal: Ability to describe self-care measures that may prevent or decrease complications (Diabetes Survival Skills Education) will improve Outcome: Progressing Goal: Individualized Educational Video(s) Outcome: Progressing   Problem: Coping: Goal: Ability to adjust to condition or change in health will improve Outcome: Progressing   Problem: Fluid Volume: Goal: Ability to  maintain a balanced intake and output will improve Outcome: Progressing   Problem: Health Behavior/Discharge Planning: Goal: Ability to identify and utilize available resources and services will improve Outcome: Progressing Goal: Ability to manage health-related needs will improve Outcome: Progressing   Problem: Metabolic: Goal: Ability to maintain appropriate glucose levels will improve Outcome: Progressing

## 2021-08-15 ENCOUNTER — Other Ambulatory Visit: Payer: Self-pay | Admitting: Physician Assistant

## 2021-08-15 DIAGNOSIS — N179 Acute kidney failure, unspecified: Secondary | ICD-10-CM | POA: Diagnosis not present

## 2021-08-15 DIAGNOSIS — I2510 Atherosclerotic heart disease of native coronary artery without angina pectoris: Secondary | ICD-10-CM | POA: Diagnosis not present

## 2021-08-15 DIAGNOSIS — N189 Chronic kidney disease, unspecified: Secondary | ICD-10-CM | POA: Diagnosis not present

## 2021-08-15 DIAGNOSIS — I5032 Chronic diastolic (congestive) heart failure: Secondary | ICD-10-CM

## 2021-08-15 DIAGNOSIS — I5041 Acute combined systolic (congestive) and diastolic (congestive) heart failure: Secondary | ICD-10-CM | POA: Diagnosis not present

## 2021-08-15 LAB — BASIC METABOLIC PANEL
Anion gap: 12 (ref 5–15)
BUN: 88 mg/dL — ABNORMAL HIGH (ref 8–23)
CO2: 40 mmol/L — ABNORMAL HIGH (ref 22–32)
Calcium: 9 mg/dL (ref 8.9–10.3)
Chloride: 87 mmol/L — ABNORMAL LOW (ref 98–111)
Creatinine, Ser: 3.06 mg/dL — ABNORMAL HIGH (ref 0.44–1.00)
GFR, Estimated: 15 mL/min — ABNORMAL LOW (ref >60)
Glucose, Bld: 120 mg/dL — ABNORMAL HIGH (ref 70–99)
Potassium: 3.6 mmol/L (ref 3.5–5.1)
Sodium: 139 mmol/L (ref 135–145)

## 2021-08-15 LAB — GLUCOSE, CAPILLARY
Glucose-Capillary: 133 mg/dL — ABNORMAL HIGH (ref 70–99)
Glucose-Capillary: 129 mg/dL — ABNORMAL HIGH (ref 70–99)

## 2021-08-15 MED ORDER — PREDNISONE 10 MG PO TABS: 10.0000 mg | ORAL_TABLET | Freq: Every day | ORAL | 1 refills | Status: DC

## 2021-08-15 MED ORDER — HYDRALAZINE HCL 25 MG PO TABS: 25.0000 mg | ORAL_TABLET | Freq: Three times a day (TID) | ORAL | 1 refills | Status: DC

## 2021-08-15 MED ORDER — FUROSEMIDE 80 MG PO TABS: 160.0000 mg | ORAL_TABLET | Freq: Every day | ORAL | 1 refills | Status: DC

## 2021-08-15 MED ORDER — VITAMIN B-12 1000 MCG PO TABS: 1000.0000 ug | ORAL_TABLET | Freq: Every day | ORAL | 1 refills | Status: DC

## 2021-08-15 MED ORDER — METOPROLOL SUCCINATE ER 50 MG PO TB24: 50.0000 mg | ORAL_TABLET | Freq: Every day | ORAL | 1 refills | Status: DC

## 2021-08-15 NOTE — TOC Transition Note (Signed)
Transition of Care [facility]) - CM/SW Discharge Note   Patient Details  Name: [name] MRN: [id number] Date of Birth: [birth year]  Transition of Care [facility]) CM/SW Contact:  Leeroy Cha, RN Phone Number: 08/15/2021, 11:23 AM   Clinical Narrative:    Home with hhc   Final next level of care: [place] Barriers to Discharge: Barriers Resolved   Patient Goals and CMS Choice Patient states their goals for this hospitalization and ongoing recovery are:: to go back home CMS Medicare.gov Compare Post Acute Care list provided to:: Patient    Discharge Placement                       Discharge Plan and Services   Discharge Planning Services: CM Consult                      HH Arranged: PT HH Agency: [place] Date [place]: 08/15/21 Time [place]: 7416 Representative spoke with at [place]: aaron  Social Determinants of Health ([place]) Interventions     Readmission Risk Interventions    01/10/2021    9:47 AM  Readmission Risk Prevention Plan  Transportation Screening Complete  PCP or Specialist Appt within 3-5 Days Complete  HRI or [place] Complete  Social Work Consult for [place] Planning/Counseling Complete  Palliative Care Screening Not Applicable  Medication Review Press photographer) Complete

## 2021-08-15 NOTE — Progress Notes (Signed)
Occupational Therapy Treatment Patient Details Name: [name] MRN: [id number] DOB: [birth year] Today's Date: 08/15/2021   History of present illness 76 year old female presenting to ED experiencing increasing shortness of breath for the last 3 days with pleuritic chest pain no fever chills or productive cough. In the ER patient has significant edema of the lower extremity and also erythema of both lower extremities. Chest x-ray shows nonspecific finding for concerning for opacity: "IMPRESSION: Increased opacity in the left base may represent atelectasis or developing infiltrate/pneumonia".  D-dimer was elevated.  Venous dopplars negative for evidence of DVTs. Medical history of chronic diastolic CHF, CAD status post CABG, complete heart block status post pacemaker placement was recently on dialysis and has been off dialysis and dialysis catheter removed about 3 weeks ago recently treated for lower extremity cellulitis. Pt seen on 04/03/21 after a fall with LT hip and pelvis fractures.   OT comments  Patient was educated on using AE for LB Dressing while seated in recliner. Patient verbalized and demonstrated understanding with min A to complete task. Patient reported feeling overwhelmed with transition home later today. Patients thoughts and feelings were heard and validated. Patient was educated on ECT for transition home. Patient verbalized understanding. Patient's discharge plan remains appropriate at this time. OT will continue to follow acutely.     Recommendations for follow up therapy are one component of a multi-disciplinary discharge planning process, led by the attending physician.  Recommendations may be updated based on patient status, additional functional criteria and insurance authorization.    Follow Up Recommendations  No OT follow up    Assistance Recommended at Discharge Frequent or constant Supervision/Assistance  Patient can return home with the following  A little help  with walking and/or transfers;A little help with bathing/dressing/bathroom   Equipment Recommendations  None recommended by OT    Recommendations for Other Services      Precautions / Restrictions Precautions Precautions: Fall Precaution Comments: Chronic Home oxygen 2 lts Restrictions Weight Bearing Restrictions: No       Mobility Bed Mobility                    Transfers                         Balance                                           ADL either performed or assessed with clinical judgement   ADL Overall ADL's : Needs assistance/impaired                       Lower Body Dressing Details (indicate cue type and reason): patient was educated on don/doffing socks and pants with AE. patient was able to don/doff socks with reacher and sock aid with min guard with consistent sequencing cues. patient was able to don simulated pair of pants over BLE to knees with reacher with increased time. patient verbalized and demonstrated understanding.                    Extremity/Trunk Assessment              Vision       Perception     Praxis      Cognition Arousal/Alertness: Awake/alert Behavior During Therapy: WFL for tasks  assessed/performed Overall Cognitive Status: Within Functional Limits for tasks assessed                                          Exercises      Shoulder Instructions       General Comments      Pertinent Vitals/ Pain       Pain Assessment Pain Assessment: No/denies pain  Home Living                                          Prior Functioning/Environment              Frequency  Min 2X/week        Progress Toward Goals  OT Goals(current goals can now be found in the care plan section)  Progress towards OT goals: Progressing toward goals     Plan Discharge plan remains appropriate    Co-evaluation                  AM-PAC OT "6 Clicks" Daily Activity     Outcome Measure   Help from another person eating meals?: None Help from another person taking care of personal grooming?: A Little Help from another person toileting, which includes using toliet, bedpan, or urinal?: A Lot Help from another person bathing (including washing, rinsing, drying)?: A Lot Help from another person to put on and taking off regular upper body clothing?: A Little Help from another person to put on and taking off regular lower body clothing?: A Lot 6 Click Score: 16    End of Session Equipment Utilized During Treatment: Oxygen  OT Visit Diagnosis: Unsteadiness on feet (R26.81);Repeated falls (R29.6);History of falling (Z91.81);Pain   Activity Tolerance Patient limited by fatigue   Patient Left in chair;with call bell/phone within reach;with chair alarm set   Nurse Communication Mobility status        Time: 5102-5852 OT Time Calculation (min): 18 min  Charges: OT General Charges $OT Visit: 1 Visit OT Treatments $Self Care/Home Management : 8-22 mins  Jackelyn Poling OTR/L, MS Acute Rehabilitation Department Office# [phone] Pager# [phone]   Marcellina Millin 08/15/2021, 3:21 PM

## 2021-08-15 NOTE — Discharge Summary (Addendum)
Physician Discharge Summary  [name] NFA:[id number] DOB: 11/12/1945 DOA: 08/04/2021  PCP: Lujean Amel, MD  Admit date: 08/04/2021 Discharge date: 08/15/2021  Admitted From: home Disposition:  home  Recommendations for Outpatient Follow-up:  Follow up with PCP in 1-2 weeks Please obtain BMP/CBC in one week Follow-up with nephrology and cardiology as an outpatient  Home Health: PT Equipment/Devices: home O2, chronic  Discharge Condition: stable CODE STATUS: Full code Diet Orders (From admission, onward)     Start     Ordered   08/04/21 2107  Diet renal/carb modified with fluid restriction Diet-HS Snack? Nothing; Fluid restriction: 1200 mL Fluid; Room service appropriate? Yes; Fluid consistency: Thin  Diet effective now       Question Answer Comment  Diet-HS Snack? Nothing   Fluid restriction: 1200 mL Fluid   Room service appropriate? Yes   Fluid consistency: Thin      08/04/21 2109            HPI: Per admitting MD, [name] is a 76 y.o. female with history of chronic diastolic CHF, CAD status post CABG, complete heart block status post pacemaker placement was recently on dialysis and has been off dialysis and dialysis catheter removed about 3 weeks ago recently treated for lower extremity cellulitis has been experiencing increasing shortness of breath for the last 3 days with pleuritic chest pain no fever chills or productive cough.  Hospital Course / Discharge diagnoses: Principal Problem:   Acute CHF (congestive heart failure) (HCC) Active Problems:   OSA (obstructive sleep apnea)   CAD S/P percutaneous coronary angioplasty   S/P CABG x 5   ESRD (end stage renal disease) ([place])   Acute combined systolic and diastolic heart failure (HCC)  Principal problem Acute on chronic hypoxic respiratory failure due to acute on chronic combined CHF -cardiology consulted and followed patient while hospitalized.  She was placed on IV Lasix with excellent  results, then eventually converted to furosemide 160 mg daily.  She seems to be tolerating this well with good urine output.  Renal function is finally stable.  Nephrology also consulted and followed patient while hospitalized. Her respiratory status improved, she is back to her home 2 L of oxygen, and will be discharged home in stable condition and will have follow-up as an outpatient.   Active problems Acute Gout -Primarily affecting right 1st MTP, though diffuse pain is noted in bilateral R > L feet/ankles.  She responded well to steroids, continue 10 mg of prednisone as she probably has a degree of iatrogenic adrenal insufficiency Iatrogenic adrenal insufficiency-she has been having a history of intermittent steroid use, and history of hypotension when she was off steroids at 1 point requiring HD use.  She will be discharged on low-dose prednisone, will benefit from outpatient referral to endocrinology Stage IV CKD, hyperphosphatemia -Recently on HD due to ATN developing postoperatively after hip fracture and replacement, with subsequent renal recovery.  Overall creatinine is stable.  Appreciate nephrology consult, will follow-up as an outpatient Bilateral lower leg cellulitis, improving -status post 5 days of treatment CAD s/p CABG, HLD with demand myocardial ischemia-flat troponin, consistent with demand ischemia Vaginal yeast infection -s/p fluconazole PAF- 95% pacing on interrogation per cardiology. Not on anticoagulation due to DAPT and low burden of AFib.  HTN, hypotension-stable, tolerating Imdur, Toprol, diuretics NIDT2DM-HbA1c 6.3%.  OSA-Nonadherent to CPAP at home Elevated D-dimer-LE venous doppler negative for DVT. Clinical scenario not strongly suggestive of PE, so will stop empiric heparin as she is  at risk of bleeding and declines further PE evaluation at this time.  Anemia of chronic kidney disease and B12 deficiency -No bleeding noted. Iron stores sufficient.  Supplement B12 IM  while admitted, then PO at discharge. Supplement folic acid which is borderline.  Thrombocytopenia -platelets have now normalized Obesity -Complicates overall prognosis and care.  BMI 50.  She would benefit from weight loss  Sepsis ruled out  Discharge Instructions  Allergies as of 08/15/2021       Reactions   Ivp Dye [iodinated Contrast Media] Shortness Of Breath   No reaction to PO contrast with non-ionic dye.06-25-2014/rsm   Shellfish Allergy Anaphylaxis   Sulfa Antibiotics Shortness Of Breath   Iodine Hives   Atorvastatin Other (See Comments)   Pt states "causes bilateral leg pain/cramps."   Benadryl [diphenhydramine] Other (See Comments)   "THIS DRIVES ME CRAZY AND MAKES ME FEEL LIKE I AM DYING"   Colchicine Nausea And Vomiting   Doxycycline Other (See Comments)   Unknown reaction, per patient   Uloric [febuxostat] Other (See Comments)   Unknown reaction   Cephalexin Itching, Rash   Zithromax [azithromycin] Rash        Medication List     STOP taking these medications    clindamycin 150 MG capsule Commonly known as: CLEOCIN   HYDROcodone-acetaminophen 5-325 MG tablet Commonly known as: NORCO/VICODIN   midodrine 10 MG tablet Commonly known as: PROAMATINE       TAKE these medications    Accu-Chek Aviva Plus test strip Generic drug: glucose blood Check blood sugar twice daily   Accu-Chek Softclix Lancets lancets Check blood sugar twice daily   acetaminophen 500 MG tablet Commonly known as: TYLENOL Take 1,000 mg by mouth 3 (three) times daily as needed (for pain or headaches).   albuterol 108 (90 Base) MCG/ACT inhaler Commonly known as: VENTOLIN HFA Inhale 2 puffs into the lungs every 6 (six) hours as needed for wheezing or shortness of breath.   albuterol (2.5 MG/3ML) 0.083% nebulizer solution Commonly known as: PROVENTIL Take 3 mLs (2.5 mg total) by nebulization every 6 (six) hours as needed for wheezing or shortness of breath.   allopurinol 100 MG  tablet Commonly known as: ZYLOPRIM Take 1 tablet (100 mg total) by mouth daily. What changed: when to take this   aspirin EC 81 MG tablet Take 1 tablet (81 mg total) by mouth daily. What changed: when to take this   B-D SINGLE USE SWABS REGULAR Pads   budesonide-formoterol 160-4.5 MCG/ACT inhaler Commonly known as: SYMBICORT Inhale 2 puffs into the lungs 2 (two) times daily.   clopidogrel 75 MG tablet Commonly known as: PLAVIX Take 1 tablet (75 mg total) by mouth daily. What changed: when to take this   cycloSPORINE 0.05 % ophthalmic emulsion Commonly known as: RESTASIS Place 1 drop into both eyes 2 (two) times daily.   docusate sodium 100 MG capsule Commonly known as: COLACE Take 100 mg by mouth at bedtime.   EPINEPHrine 0.3 mg/0.3 mL Soaj injection Commonly known as: EPI-PEN Inject 0.3 mg into the muscle once as needed for anaphylaxis (severe allergic reaction).   furosemide 80 MG tablet Commonly known as: LASIX Take 2 tablets (160 mg total) by mouth daily. Start taking on: August 16, 2021 What changed:  medication strength how much to take when to take this   hydrALAZINE 25 MG tablet Commonly known as: APRESOLINE Take 1 tablet (25 mg total) by mouth every 8 (eight) hours.   hydrOXYzine 25 MG tablet  Commonly known as: ATARAX Take 25 mg by mouth 2 (two) times daily as needed for itching.   isosorbide mononitrate 30 MG 24 hr tablet Commonly known as: IMDUR TAKE 3 TABLETS BY MOUTH  DAILY What changed: when to take this   Jardiance 10 MG Tabs tablet Generic drug: empagliflozin Take 10 mg by mouth daily before breakfast.   Lidocaine 0.5 % Aero Place 1 spray rectally every 6 (six) hours as needed (hemorrhoids). Preparation H rapid relief lidocaine spray   linaclotide 145 MCG Caps capsule Commonly known as: LINZESS Take 145 mcg by mouth daily before breakfast.   metoprolol succinate 50 MG 24 hr tablet Commonly known as: TOPROL-XL Take 1 tablet (50 mg total)  by mouth daily. Take with or immediately following a meal. Start taking on: August 16, 2021   nitroGLYCERIN 0.4 MG SL tablet Commonly known as: NITROSTAT Place 1 tablet (0.4 mg total) under the tongue every 5 (five) minutes as needed for chest pain.   ondansetron 4 MG tablet Commonly known as: ZOFRAN Take 4 mg by mouth daily as needed for nausea or vomiting.   OXYGEN Inhale 2-3 L/min into the lungs continuous.   oxymetazoline 0.05 % nasal spray Commonly known as: AFRIN Place 1 spray into both nostrils 2 (two) times daily as needed for congestion.   pantoprazole 40 MG tablet Commonly known as: PROTONIX Take 1 tablet (40 mg total) by mouth 2 (two) times daily.   polyethylene glycol 17 g packet Commonly known as: MIRALAX / GLYCOLAX Take 17 g by mouth daily as needed (constipation).   predniSONE 10 MG tablet Commonly known as: DELTASONE Take 1 tablet (10 mg total) by mouth daily with breakfast.   rosuvastatin 20 MG tablet Commonly known as: CRESTOR Take 1 tablet (20 mg total) by mouth every evening. NEED OV.   senna-docusate 8.6-50 MG tablet Commonly known as: Senokot-S Take 1 tablet by mouth 2 (two) times daily.   sucralfate 1 g tablet Commonly known as: CARAFATE Take 1 tablet (1 g total) by mouth 4 (four) times daily -  with meals and at bedtime. What changed:  how much to take when to take this   traMADol 50 MG tablet Commonly known as: ULTRAM Take 50 mg by mouth daily as needed (pain).   vitamin B-12 1000 MCG tablet Commonly known as: CYANOCOBALAMIN Take 1 tablet (1,000 mcg total) by mouth daily.       Consultations: Cardiology Nephrology   Procedures/Studies:  DG CHEST PORT 1 VIEW  Result Date: 08/14/2021 CLINICAL DATA:  Shortness of breath. EXAM: PORTABLE CHEST 1 VIEW COMPARISON:  08/13/2021 and 08/12/2021 radiographs.  CT 08/18/2013. FINDINGS: 0448 hours. The right subclavian AICD leads appear unchanged. The heart size and mediastinal contours are stable  status post median sternotomy and CABG. Unchanged retrocardiac opacity, small left pleural effusion and vascular congestion. No evidence of pneumothorax or acute osseous abnormality. IMPRESSION: Unchanged appearance of the chest. Electronically Signed   By: Richardean Sale M.D.   On: 08/14/2021 08:05   DG CHEST PORT 1 VIEW  Result Date: 08/13/2021 CLINICAL DATA:  Shortness of breath. EXAM: PORTABLE CHEST 1 VIEW COMPARISON:  08/12/2021 FINDINGS: 0512 hours. SIRT cardiomegaly There is pulmonary vascular congestion without overt pulmonary edema. Retrocardiac collapse/consolidation is similar to prior with probable left effusion. Right permanent pacemaker/AICD again noted. Telemetry leads overlie the chest. IMPRESSION: 1. No substantial change. 2. Cardiomegaly with pulmonary vascular congestion. 3. Retrocardiac collapse/consolidation with probable left effusion. Electronically Signed   By: Misty Stanley  M.D.   On: 08/13/2021 10:32   DG CHEST PORT 1 VIEW  Result Date: 08/12/2021 CLINICAL DATA:  76 year old with history of CHF. EXAM: PORTABLE CHEST 1 VIEW COMPARISON:  August 11, 2021. FINDINGS: RIGHT-sided multi lead pacer defibrillator remains in place, power pack over the RIGHT chest entering via RIGHT subclavian approach. Post median sternotomy for CABG. EKG leads project over the chest as well. Cardiomediastinal contours and hilar structures are stable with signs of cardiomegaly. LEFT lower lobe opacification slightly increased from the prior study. No visible pneumothorax. On limited assessment no acute skeletal findings. IMPRESSION: Cardiomegaly post CABG. Increasing LEFT lower lobe opacification may reflect effusion and volume loss versus developing consolidation. Attention on follow-up. Electronically Signed   By: Zetta Bills M.D.   On: 08/12/2021 08:23   DG CHEST PORT 1 VIEW  Result Date: 08/11/2021 CLINICAL DATA:  Congestive heart failure. EXAM: PORTABLE CHEST 1 VIEW COMPARISON:  Radiographs 08/10/2021,  08/04/2021 and 06/11/2021. FINDINGS: 0506 hours. Unchanged right subclavian AICD and mild cardiomegaly post CABG. Unchanged pleuroparenchymal opacities at the left lung base. The right lung is clear. No evidence of pneumothorax. IMPRESSION: Stable radiographic appearance of the chest from yesterday. Left basilar pleuroparenchymal opacities appear at least partly chronic. Electronically Signed   By: Richardean Sale M.D.   On: 08/11/2021 08:46   DG CHEST PORT 1 VIEW  Result Date: 08/10/2021 CLINICAL DATA:  Congestive heart failure.  Shortness of breath. EXAM: PORTABLE CHEST 1 VIEW COMPARISON:  Radiographs 08/04/2021, 06/11/2021 and 06/10/2021. CT 08/18/2013. FINDINGS: 0536 hours. Right subclavian AICD leads appear unchanged. There is stable mild cardiac enlargement status post median sternotomy and CABG. Small left pleural effusion or thickening and mild left lower lobe airspace disease are unchanged from the recent prior study, appearing mildly increased from May. The right lung is clear. There is no edema or pneumothorax. The bones appear unremarkable. IMPRESSION: No change in pleuroparenchymal opacities at the left lung base compared with prior study of 1 week ago. No evidence of edema. Electronically Signed   By: Richardean Sale M.D.   On: 08/10/2021 08:51   VAS [place] LOWER EXTREMITY VENOUS (DVT)  Result Date: 08/05/2021  Lower Venous DVT Study Patient Name:  SYDNI ELIZARRARAZ  Date of Exam:   08/05/2021 Medical Rec #: [id number]          Accession #:    [id number] Date of Birth: 12/01/1945           Patient Gender: F Patient Age:   60 years Exam Location:  [facility] Procedure:      VAS [place] LOWER EXTREMITY VENOUS (DVT) Referring Phys: Gean Birchwood --------------------------------------------------------------------------------  Indications: Pain, edema, redness, and warmth to BLE (cellulitis).  Limitations: Body habitus and poor ultrasound/tissue interface. Comparison Study: Previous exams on  04/13/21, 02/14/21, 06/08/18 ([facility]), 10/15/18,                   09/29/17, 01/16/17, 04/29/15, 12/22/14, & 07/01/00 were ALL                   negative for DVT. Performing Technologist: Rogelia Rohrer RVT, RDMS  Examination Guidelines: A complete evaluation includes B-mode imaging, spectral Doppler, color Doppler, and power Doppler as needed of all accessible portions of each vessel. Bilateral testing is considered an integral part of a complete examination. Limited examinations for reoccurring indications may be performed as noted. The reflux portion of the exam is performed with the patient in reverse Trendelenburg.  +---------+---------------+---------+-----------+----------+-------------------+ RIGHT  CompressibilityPhasicitySpontaneityPropertiesThrombus Aging      +---------+---------------+---------+-----------+----------+-------------------+ CFV      Full           No       Yes                                      +---------+---------------+---------+-----------+----------+-------------------+ SFJ      Full                                                             +---------+---------------+---------+-----------+----------+-------------------+ FV Prox  Full           No       Yes                                      +---------+---------------+---------+-----------+----------+-------------------+ FV Mid   Full           No       Yes                                      +---------+---------------+---------+-----------+----------+-------------------+ FV DistalFull           No       Yes                                      +---------+---------------+---------+-----------+----------+-------------------+ PFV      Full                                                             +---------+---------------+---------+-----------+----------+-------------------+ POP      Full           No       Yes                                       +---------+---------------+---------+-----------+----------+-------------------+ PTV      Full                                         Not well visualized +---------+---------------+---------+-----------+----------+-------------------+ PERO                                                  Not visualized      +---------+---------------+---------+-----------+----------+-------------------+   Right Technical Findings: Not visualized segments include Peroneal veins. Pulsatile flow noted in all doppler waveforms of RLE.  +--------+---------------+---------+-----------+----------+--------------------+ LEFT    CompressibilityPhasicitySpontaneityPropertiesThrombus Aging       +--------+---------------+---------+-----------+----------+--------------------+ CFV     Full  No       Yes                                       +--------+---------------+---------+-----------+----------+--------------------+ SFJ     Full                                                              +--------+---------------+---------+-----------+----------+--------------------+ FV Prox Full           Yes      Yes                                       +--------+---------------+---------+-----------+----------+--------------------+ FV Mid                 Yes      Yes                  patent by                                                                 color/doppler        +--------+---------------+---------+-----------+----------+--------------------+ FV                     Yes      Yes                  patent by            Distal                                               color/doppler        +--------+---------------+---------+-----------+----------+--------------------+ PFV     Full                                                              +--------+---------------+---------+-----------+----------+--------------------+ POP     Full           Yes       Yes                                       +--------+---------------+---------+-----------+----------+--------------------+ PTV                                                  Not well visualized  +--------+---------------+---------+-----------+----------+--------------------+ PERO    Full  Not well visualized  +--------+---------------+---------+-----------+----------+--------------------+   Left Technical Findings: Pulsatile flow noted in CFV   Summary: BILATERAL: - No evidence of deep vein thrombosis seen in the lower extremities, bilaterally. -No evidence of popliteal cyst, bilaterally. - Subcutaneous edema seen in area of calf & ankle, bilaterally.   *See table(s) above for measurements and observations. Electronically signed by Servando Snare MD on 08/05/2021 at 3:15:51 PM.    Final    ECHOCARDIOGRAM COMPLETE  Result Date: 08/05/2021    ECHOCARDIOGRAM REPORT   Patient Name:   TRENDA CORLISS Date of Exam: 08/05/2021 Medical Rec #:  [id number]         Height:       63.0 in Accession #:    [id number]        Weight:       222.9 lb Date of Birth:  04/27/1945          BSA:          2.025 m Patient Age:    58 years          BP:           122/66 mmHg Patient Gender: F                 HR:           97 bpm. Exam Location:  Inpatient Procedure: 2D Echo, Cardiac Doppler and Color Doppler Indications:    Dyspnea R06.00  History:        Patient has prior history of Echocardiogram examinations, most                 recent 09/17/2020. Cardiomyopathy and CHF, CAD, Pacemaker, Stroke,                 Arrythmias:Atrial Fibrillation, Signs/Symptoms:Dyspnea; Risk                 Factors:Hypertension, GERD, Diabetes and Sleep Apnea.  Sonographer:    Bernadene Person RDCS Referring Phys: [id number] [place]  Sonographer Comments: Patient is morbidly obese. IMPRESSIONS  1. Technically difficult study. Left ventricular ejection fraction, by estimation, is 45 to 50%. The left  ventricle has mildly decreased function. The left ventricle demonstrates regional wall motion abnormalities (see scoring diagram/findings for description). Apical hypokinesis. There is mild left ventricular hypertrophy. Left ventricular diastolic parameters are indeterminate.  2. Right ventricular systolic function is normal. The right ventricular size is normal. There is mildly elevated pulmonary artery systolic pressure. The estimated right ventricular systolic pressure is 55.7 mmHg.  3. The mitral valve is degenerative. No evidence of mitral valve regurgitation. Moderate mitral annular calcification.  4. The aortic valve was not well visualized. There is moderate calcification of the aortic valve. Aortic valve regurgitation is trivial. Mild aortic valve stenosis. Vmax 2.5 m/s, MG 13 mmHg, AVA 2.0 cm^2, DI 0.48  5. The inferior vena cava is dilated in size with <50% respiratory variability, suggesting right atrial pressure of 15 mmHg. FINDINGS  Left Ventricle: Left ventricular ejection fraction, by estimation, is 45 to 50%. The left ventricle has mildly decreased function. The left ventricle demonstrates regional wall motion abnormalities. The left ventricular internal cavity size was normal in size. There is mild left ventricular hypertrophy. Left ventricular diastolic parameters are indeterminate.  LV Wall Scoring: The apical lateral segment, apical inferior segment, and apex are hypokinetic. The entire anterior wall, antero-lateral wall, entire septum, inferior wall, and posterior wall are normal. Right Ventricle: The right ventricular size is normal. Right  vetricular wall thickness was not well visualized. Right ventricular systolic function is normal. There is mildly elevated pulmonary artery systolic pressure. The tricuspid regurgitant velocity  is 2.59 m/s, and with an assumed right atrial pressure of 15 mmHg, the estimated right ventricular systolic pressure is 83.1 mmHg. Left Atrium: Left atrial size was  normal in size. Right Atrium: Right atrial size was normal in size. Pericardium: There is no evidence of pericardial effusion. Mitral Valve: The mitral valve is degenerative in appearance. Moderate mitral annular calcification. No evidence of mitral valve regurgitation. Tricuspid Valve: The tricuspid valve is normal in structure. Tricuspid valve regurgitation is trivial. Aortic Valve: The aortic valve was not well visualized. There is moderate calcification of the aortic valve. Aortic valve regurgitation is trivial. Mild aortic stenosis is present. Aortic valve mean gradient measures 11.7 mmHg. Aortic valve peak gradient  measures 22.9 mmHg. Aortic valve area, by VTI measures 2.03 cm. Pulmonic Valve: The pulmonic valve was not well visualized. Pulmonic valve regurgitation is trivial. Aorta: The aortic root is normal in size and structure. Venous: The inferior vena cava is dilated in size with less than 50% respiratory variability, suggesting right atrial pressure of 15 mmHg. IAS/Shunts: The interatrial septum was not well visualized.  LEFT VENTRICLE PLAX 2D LVIDd:         4.80 cm   Diastology LVIDs:         3.00 cm   LV e' medial:    4.56 cm/s LV PW:         1.20 cm   LV E/e' medial:  21.8 LV IVS:        1.10 cm   LV e' lateral:   6.75 cm/s LVOT diam:     2.30 cm   LV E/e' lateral: 14.7 LV SV:         96 LV SV Index:   47 LVOT Area:     4.15 cm  RIGHT VENTRICLE RV S prime:     5.68 cm/s TAPSE (M-mode): 1.5 cm LEFT ATRIUM             Index        RIGHT ATRIUM           Index LA diam:        3.70 cm 1.83 cm/m   RA Area:     13.00 cm LA Vol (A2C):   44.9 ml 22.18 ml/m  RA Volume:   26.40 ml  13.04 ml/m LA Vol (A4C):   75.0 ml 37.04 ml/m LA Biplane Vol: 64.2 ml 31.71 ml/m  AORTIC VALVE AV Area (Vmax):    1.68 cm AV Area (Vmean):   1.88 cm AV Area (VTI):     2.03 cm AV Vmax:           239.33 cm/s AV Vmean:          158.333 cm/s AV VTI:            0.473 m AV Peak Grad:      22.9 mmHg AV Mean Grad:      11.7  mmHg LVOT Vmax:         97.00 cm/s LVOT Vmean:        71.800 cm/s LVOT VTI:          0.231 m LVOT/AV VTI ratio: 0.49  AORTA Ao Root diam: 3.70 cm Ao Asc diam:  3.70 cm MITRAL VALVE                TRICUSPID  VALVE MV Area (PHT): 4.06 cm     TR Peak grad:   26.8 mmHg MV Decel Time: 187 msec     TR Vmax:        259.00 cm/s MV E velocity: 99.20 cm/s MV A velocity: 145.00 cm/s  SHUNTS MV E/A ratio:  0.68         Systemic VTI:  0.23 m                             Systemic Diam: 2.30 cm Oswaldo Milian MD Electronically signed by Oswaldo Milian MD Signature Date/Time: 08/05/2021/2:26:03 PM    Final    DG Chest 2 View  Result Date: 08/04/2021 CLINICAL DATA:  Shortness of breath. EXAM: CHEST - 2 VIEW COMPARISON:  Multiple chest x-rays since April 12, 2021 FINDINGS: Increased opacity in the left base with streaking components. Stable pacemaker/AICD device. Stable cardiomediastinal silhouette. No pneumothorax. No nodules or masses. No overt edema. IMPRESSION: Increased opacity in the left base may represent atelectasis or developing infiltrate/pneumonia. Recommend clinical correlation with short-term follow-up imaging to ensure resolution. Electronically Signed   By: Dorise Bullion III M.D.   On: 08/04/2021 16:24     Subjective: - no chest pain, shortness of breath, no abdominal pain, nausea or vomiting.   Discharge Exam: BP (!) 149/92 (BP Location: Left Arm)   Pulse 73   Temp 98.1 F (36.7 C) (Oral)   Resp (!) 24   Ht '5\' 3"'$  (1.6 m)   Wt 101.1 kg   SpO2 97%   BMI 39.48 kg/m   General: Pt is alert, awake, not in acute distress Cardiovascular: RRR, S1/S2 +, no rubs, no gallops Respiratory: CTA bilaterally, no wheezing, no rhonchi Abdominal: Soft, NT, ND, bowel sounds + Extremities: no edema, no cyanosis   The results of significant diagnostics from this hospitalization (including imaging, microbiology, ancillary and laboratory) are listed below for reference.     Microbiology: No results  found for this or any previous visit (from the past 240 hour(s)).   Labs: Basic Metabolic Panel: Recent Labs  Lab 08/10/21 0409 08/11/21 0421 08/12/21 0346 08/13/21 0352 08/14/21 0400 08/15/21 0725  NA 136 137 137 138 138 139  K 4.2 4.2 4.2 4.2 4.0 3.6  CL 88* 88* 88* 89* 89* 87*  CO2 38* 39* 34* 35* 38* 40*  GLUCOSE 164* 187* 153* 145* 137* 120*  BUN 49* 63* 72* 79* 85* 88*  CREATININE 2.75* 2.95* 3.08* 3.03* 3.13* 3.06*  CALCIUM 8.7* 8.7* 8.6* 8.6* 8.7* 9.0  MG 2.0 2.4 2.2 2.3 2.3  --   PHOS 4.5 5.1* 4.9* 5.3* 5.1*  --    Liver Function Tests: Recent Labs  Lab 08/10/21 0409 08/11/21 0421 08/12/21 0346 08/13/21 0352 08/14/21 0400  AST '19 24 23 17 15  '$ ALT '15 19 22 20 16  '$ ALKPHOS 70 81 85 89 86  BILITOT 0.5 0.5 0.4 0.5 0.4  PROT 5.9* 6.2* 6.3* 6.1* 6.0*  ALBUMIN 2.8* 3.1* 3.2* 3.1* 3.0*   CBC: Recent Labs  Lab 08/10/21 0409 08/11/21 0421 08/12/21 0346 08/13/21 0352 08/14/21 0400  WBC 4.9 4.8 4.9 5.1 5.8  NEUTROABS 4.3 4.0 3.8 3.7 4.2  HGB 8.9* 9.1* 9.5* 9.6* 9.3*  HCT 28.4* 30.2* 31.2* 32.6* 30.7*  MCV 101.1* 104.1* 101.6* 103.8* 102.0*  PLT 137* 142* 146* 148* 163   CBG: Recent Labs  Lab 08/14/21 0732 08/14/21 1151 08/14/21 1641 08/14/21 2145 08/15/21 2094  GLUCAP 131* 149* 247* 227* 133*   Hgb A1c No results for input(s): "HGBA1C" in the last 72 hours. Lipid Profile No results for input(s): "CHOL", "HDL", "LDLCALC", "TRIG", "CHOLHDL", "LDLDIRECT" in the last 72 hours. Thyroid function studies No results for input(s): "TSH", "T4TOTAL", "T3FREE", "THYROIDAB" in the last 72 hours.  Invalid input(s): "FREET3" Urinalysis    Component Value Date/Time   COLORURINE YELLOW 08/04/2021 1710   APPEARANCEUR HAZY (A) 08/04/2021 1710   LABSPEC 1.014 08/04/2021 1710   PHURINE 5.0 08/04/2021 1710   GLUCOSEU NEGATIVE 08/04/2021 1710   HGBUR NEGATIVE 08/04/2021 1710   BILIRUBINUR NEGATIVE 08/04/2021 1710   KETONESUR NEGATIVE 08/04/2021 1710   PROTEINUR  30 (A) 08/04/2021 1710   UROBILINOGEN 0.2 12/06/2014 1531   NITRITE NEGATIVE 08/04/2021 1710   LEUKOCYTESUR MODERATE (A) 08/04/2021 1710    FURTHER DISCHARGE INSTRUCTIONS:   Get Medicines reviewed and adjusted: Please take all your medications with you for your next visit with your Primary MD   Laboratory/radiological data: Please request your Primary MD to go over all hospital tests and procedure/radiological results at the follow up, please ask your Primary MD to get all Hospital records sent to his/her office.   In some cases, they will be blood work, cultures and biopsy results pending at the time of your discharge. Please request that your primary care M.D. goes through all the records of your hospital data and follows up on these results.   Also Note the following: If you experience worsening of your admission symptoms, develop shortness of breath, life threatening emergency, suicidal or homicidal thoughts you must seek medical attention immediately by calling 911 or calling your MD immediately  if symptoms less severe.   You must read complete instructions/literature along with all the possible adverse reactions/side effects for all the Medicines you take and that have been prescribed to you. Take any new Medicines after you have completely understood and accpet all the possible adverse reactions/side effects.    Do not drive when taking Pain medications or sleeping medications (Benzodaizepines)   Do not take more than prescribed Pain, Sleep and Anxiety Medications. It is not advisable to combine anxiety,sleep and pain medications without talking with your primary care practitioner   Special Instructions: If you have smoked or chewed Tobacco  in the last 2 yrs please stop smoking, stop any regular Alcohol  and or any Recreational drug use.   Wear Seat belts while driving.   Please note: You were cared for by a hospitalist during your hospital stay. Once you are discharged, your  primary care physician will handle any further medical issues. Please note that NO REFILLS for any discharge medications will be authorized once you are discharged, as it is imperative that you return to your primary care physician (or establish a relationship with a primary care physician if you do not have one) for your post hospital discharge needs so that they can reassess your need for medications and monitor your lab values.  Time coordinating discharge: 40 minutes  SIGNED:  Marzetta Board, MD, PhD 08/15/2021, 11:11 AM

## 2021-08-15 NOTE — Progress Notes (Signed)
Physical Therapy Treatment Patient Details Name: [name] MRN: [id number] DOB: 18-Jul-1945 Today's Date: 08/15/2021   History of Present Illness 76 year old female presenting to ED experiencing increasing shortness of breath for the last 3 days with pleuritic chest pain no fever chills or productive cough. In the ER patient has significant edema of the lower extremity and also erythema of both lower extremities. Chest x-ray shows nonspecific finding for concerning for opacity: "IMPRESSION: Increased opacity in the left base may represent atelectasis or developing infiltrate/pneumonia".  D-dimer was elevated.  Venous dopplars negative for evidence of DVTs. Medical history of chronic diastolic CHF, CAD status post CABG, complete heart block status post pacemaker placement was recently on dialysis and has been off dialysis and dialysis catheter removed about 3 weeks ago recently treated for lower extremity cellulitis. Pt seen on 04/03/21 after a fall with LT hip and pelvis fractures.    PT Comments    AxO x 3 very pleasant Lady.  OOB in recliner.  On her usual 2 lts nasal.  Resting sats 94%.  Assisted to bathroom then amb in hallway went well.  General transfer comment: pt self able to rise from recliner and elevevated toilet.   Pt self able to perform peri care in standing and maintain a safe balance. General Gait Details: pt tolerated amb to bathroom, and twice in hallway with seated rest breaks on 3 lts oxygen vs 2 to achieve sats >90%.  All together, pt amb 65 feet. Required 3 lts during activity.   Pt plan to D/C to her home with family support.  Has all equipment.     Recommendations for follow up therapy are one component of a multi-disciplinary discharge planning process, led by the attending physician.  Recommendations may be updated based on patient status, additional functional criteria and insurance authorization.  Follow Up Recommendations  Home health PT     Assistance Recommended  at Discharge Intermittent Supervision/Assistance  Patient can return home with the following A little help with walking and/or transfers;A little help with bathing/dressing/bathroom;Assistance with cooking/housework;Direct supervision/assist for medications management;Direct supervision/assist for financial management;Assist for transportation;Help with stairs or ramp for entrance   Equipment Recommendations  None recommended by PT    Recommendations for Other Services       Precautions / Restrictions Precautions Precautions: Fall Precaution Comments: Chronic Home oxygen 2 lts Restrictions Weight Bearing Restrictions: No     Mobility  Bed Mobility               General bed mobility comments: OOB in recliner    Transfers Overall transfer level: Needs assistance Equipment used: Rolling walker (2 wheels) Transfers: Sit to/from Stand Sit to Stand: Supervision           General transfer comment: pt self able to rise from recliner and elevevated toilet.   Pt self able to perform peri care in standing and maintain a safe balance.    Ambulation/Gait Ambulation/Gait assistance: Supervision Gait Distance (Feet): 65 Feet Assistive device: Rolling walker (2 wheels) Gait Pattern/deviations: Step-through pattern, Decreased stride length, Decreased step length - left, Decreased step length - right, Trunk flexed, Wide base of support Gait velocity: decr     General Gait Details: pt tolerated amb to bathroom, and twice in hallway with seated rest breaks on 3 lts oxygen vs 2 to achieve sats >90%.  All together, pt amb 65 feet.   Stairs             Emergency planning/management officer  Modified Rankin (Stroke Patients Only)       Balance                                            Cognition Arousal/Alertness: Awake/alert Behavior During Therapy: WFL for tasks assessed/performed Overall Cognitive Status: Within Functional Limits for tasks assessed                                  General Comments: AxO x 3 very pleasant Lady who plans to D/C back her house with family support.        Exercises      General Comments        Pertinent Vitals/Pain Pain Assessment Pain Assessment: Faces Faces Pain Scale: Hurts a little bit Pain Location: not specified/general Pain Descriptors / Indicators: Aching, Grimacing Pain Intervention(s): Monitored during session    Home Living                          Prior Function            PT Goals (current goals can now be found in the care plan section) Progress towards PT goals: Progressing toward goals    Frequency    Min 3X/week      PT Plan Current plan remains appropriate    Co-evaluation              AM-PAC PT "6 Clicks" Mobility   Outcome Measure  Help needed turning from your back to your side while in a flat bed without using bedrails?: A Little Help needed moving from lying on your back to sitting on the side of a flat bed without using bedrails?: A Little Help needed moving to and from a bed to a chair (including a wheelchair)?: A Little Help needed standing up from a chair using your arms (e.g., wheelchair or bedside chair)?: A Little Help needed to walk in hospital room?: A Little Help needed climbing 3-5 steps with a railing? : A Lot 6 Click Score: 17    End of Session Equipment Utilized During Treatment: Gait belt;Oxygen Activity Tolerance: Patient tolerated treatment well Patient left: in chair;with call bell/phone within reach;with chair alarm set Nurse Communication: Mobility status PT Visit Diagnosis: Muscle weakness (generalized) (M62.81);Difficulty in walking, not elsewhere classified (R26.2);Unsteadiness on feet (R26.81)     Time: 6389-3734 PT Time Calculation (min) (ACUTE ONLY): 28 min  Charges:  $Gait Training: 8-22 mins $Therapeutic Activity: 8-22 mins                    Rica Koyanagi  PTA Acute  Rehabilitation  Services Office M-F          [phone] Weekend pager [phone]

## 2021-08-15 NOTE — Progress Notes (Signed)
Continues with net diuresis (net -1.1 L / 24 hours) and renal function is stable, maybe even slight improvement, on current dose of diuretic (furosemide to 160 mg once daily). Edema slowly decreasing, still has 2+ soft pitting edema both lower extremities almost to the knees, but also a lot of wrinkling suggesting improvement.  Clear lungs. Dose of prednisone decreased to 10 mg daily after resolution of acute gout attack. Plan for discharge today.  We will make arrangements for basic metabolic panel next week.  She has follow-up appointment on 09/02/2021 with me and on 09/19/2021 with Dr. Haroldine Laws.

## 2021-08-15 NOTE — TOC Progression Note (Addendum)
Transition of Care [facility]) - Progression Note    Patient Details  Name: [name] MRN: [id number] Date of Birth: Jul 07, 1945  [facility] CM/SW Contact  Leeroy Cha, RN Phone Number: 08/15/2021, 11:21 AM  Clinical Narrative:    Information for hhc orders sent to [facility] with whom she is already active with.   Expected Discharge Plan: Home/Self Care Barriers to Discharge: Barriers Resolved  Expected Discharge Plan and Services Expected Discharge Plan: Home/Self Care   Discharge Planning Services: CM Consult   Living arrangements for the past 2 months: Single Family Home Expected Discharge Date: 08/15/21                         HH Arranged: PT HH Agency: [facility] ([place]) Date [place]: 08/15/21 Time [place]: 1121 Representative spoke with at [place]: [place] ([place]) Interventions    Readmission Risk Interventions    01/10/2021    9:47 AM  Readmission Risk Prevention Plan  Transportation Screening Complete  PCP or Specialist Appt within 3-5 Days Complete  HRI or [place] Complete  Social Work Consult for [place] Planning/Counseling Complete  Palliative Care Screening Not Applicable  Medication Review Press photographer) Complete

## 2021-08-15 NOTE — Plan of Care (Signed)
  Problem: Nutrition: Goal: Adequate nutrition will be maintained Outcome: Adequate for Discharge   Problem: Elimination: Goal: Will not experience complications related to bowel motility Outcome: Adequate for Discharge   Problem: Skin Integrity: Goal: Risk for impaired skin integrity will decrease Outcome: Adequate for Discharge

## 2021-08-16 LAB — MULTIPLE MYELOMA PANEL, SERUM
Albumin SerPl Elph-Mcnc: 2.9 g/dL (ref 2.9–4.4)
Albumin/Glob SerPl: 1.1 (ref 0.7–1.7)
Alpha 1: 0.4 g/dL (ref 0.0–0.4)
Alpha2 Glob SerPl Elph-Mcnc: 0.8 g/dL (ref 0.4–1.0)
B-Globulin SerPl Elph-Mcnc: 0.8 g/dL (ref 0.7–1.3)
Gamma Glob SerPl Elph-Mcnc: 0.8 g/dL (ref 0.4–1.8)
Globulin, Total: 2.8 g/dL (ref 2.2–3.9)
IgA: 124 mg/dL (ref 64–422)
IgG (Immunoglobin G), Serum: 769 mg/dL (ref 586–1602)
IgM (Immunoglobulin M), Srm: 72 mg/dL (ref 26–217)
Total Protein ELP: 5.7 g/dL — ABNORMAL LOW (ref 6.0–8.5)

## 2021-08-20 ENCOUNTER — Telehealth: Payer: Self-pay | Admitting: Cardiovascular Disease

## 2021-08-20 NOTE — Telephone Encounter (Signed)
Pt c/o swelling: STAT is pt has developed SOB within 24 hours  If swelling, where is the swelling located? R Foot  How much weight have you gained and in what time span? NA  Have you gained 3 pounds in a day or 5 pounds in a week? No  Do you have a log of your daily weights (if so, list)? NO  Are you currently taking a fluid pill? Yes  Are you currently SOB? NO  Have you traveled recently? No   Pt states that she has swelling in right foot to the point that it has started to bleed. Please advise

## 2021-08-20 NOTE — Telephone Encounter (Signed)
Is she still on antibiotics by mouth? (She was just hospitalized for cellulitis). Any fever or chills?

## 2021-08-20 NOTE — Progress Notes (Unsigned)
Cardiology Clinic Note   Patient Name: [name] Date of Encounter: 08/21/2021  Primary Care Provider:  Lujean Amel, MD Primary Cardiologist:  Sanda Klein, MD  Patient Profile    [name] 76 year old female presents to clinic today for follow-up evaluation of her shortness of breath.  Past Medical History    Past Medical History:  Diagnosis Date   AICD (automatic cardioverter/defibrillator) present    downgraded to CRT-P in 2014   Anemia    Arthritis    Asthma    Atrial fibrillation ([place]) 10/24/2015   Questionable history of A Fib/A Flutter. On device interrogation on 9/7, showed 0.93mn of A Fib/A Flutter with 1% burden.  Device check in Jan 2018 showed no sustained AF (runs of less than 30 seconds). No anticoagulation indicated.   CAD (coronary artery disease)    a. 10/09/16 LHC: SVG->LAD patent, SVG->Diag patent, SVG->RCA patent, SVG->LCx occluded. EF 60%, b. 10/31/16 LHC DES to AV groove Circ, DES to intermed branch   CHF (congestive heart failure) (HCC)    Chronic lower back pain    Fatty liver disease, nonalcoholic    GERD (gastroesophageal reflux disease)    Gout    H/O hiatal hernia    High cholesterol    Hyperplastic colon polyp    Hypertension    IBS (irritable bowel syndrome)    Internal hemorrhoids    INTERNAL HEMORRHOIDS WITHOUT MENTION COMP 04/12/2007   Qualifier: Diagnosis of  By: BOlevia PerchesMD, Dora M    Nonischemic cardiomyopathy ([place] 11/22/2011   Pt responded to BiV ICD- last EF 55-60% Sept 2017   Obesity    OSA (obstructive sleep apnea)    "can't tolerate a mask", wears 2L nocturnal O2   Presence of permanent cardiac pacemaker    11/02/12 [employer] V273 INTUA PPM   Stroke ([place] 09/2020   Type II diabetes mellitus ([place]    Past Surgical History:  Procedure Laterality Date   ABDOMINAL ULTRASOUND  12/01/2011   Peripelvic cysts- #1- 2.4x1.9x2.3cm, #2-1.2x0.9x1.2cm   ANKLE FRACTURE SURGERY Right    "had rod put in"    ANTERIOR CERVICAL DECOMP/DISCECTOMY FUSION     APPENDECTOMY     BACK SURGERY     BIOPSY  12/29/2017   Procedure: BIOPSY;  Surgeon: NMauri Pole MD;  [facility];  Service: Endoscopy;;   BIV ICD INSERTION CRT-D  2001?   BIV PACEMAKER GENERATOR CHANGE OUT N/A 11/02/2012   Procedure: BIV PACEMAKER GENERATOR CHANGE OUT;  Surgeon: MSanda Klein MD;  [facility];  Service: Cardiovascular;  Laterality: N/A;   CARDIAC CATHETERIZATION  05/17/1999   No significant coronary obstructive disease w/ mild 20% luminal irregularity of the first diag branch of the LAD   CARDIAC CATHETERIZATION  07/08/2002   No significant CAD, moderately depressed LV systolic function   CARDIAC CATHETERIZATION Bilateral 04/26/2007   Normal findings, recommend medical therapy   CARDIAC CATHETERIZATION  02/18/2008   Moderate CAD, would benefit from having a functional study, recommend continue medical therapy   CARDIAC CATHETERIZATION  07/23/2012   Medical therapy   CARDIAC CATHETERIZATION N/A 11/24/2014   Procedure: Left Heart Cath and Coronary Angiography;  Surgeon: TTroy Sine MD;  [facility];  Service: Cardiovascular;  Laterality: N/A;   CARDIAC CATHETERIZATION  11/27/2014   Procedure: Intravascular Pressure Wire/FFR Study;  Surgeon: Peter M JMartinique MD;  [facility];  Service: Cardiovascular;;   CARDIAC CATHETERIZATION  10/09/2016  CHOLECYSTECTOMY N/A 04/09/2018   Procedure: LAPAROSCOPIC CHOLECYSTECTOMY;  Surgeon: Greer Pickerel, MD;  [facility];  Service: General;  Laterality: N/A;   COLONOSCOPY WITH PROPOFOL N/A 12/29/2017   Procedure: COLONOSCOPY WITH PROPOFOL;  Surgeon: [name], MD;  [facility];  Service: Endoscopy;  Laterality: N/A;   CORONARY ANGIOGRAM  2010   CORONARY ARTERY BYPASS GRAFT N/A 11/29/2014   Procedure: CORONARY ARTERY BYPASS GRAFTING x 5 (LIMA-LAD, SVG-D, SVG-OM1-OM2, SVG-PD);  Surgeon: Melrose Nakayama, MD;   Location: [facility];  Service: Open Heart Surgery;  Laterality: N/A;   CORONARY STENT INTERVENTION N/A 10/31/2016   Procedure: CORONARY STENT INTERVENTION;  Surgeon: Burnell Blanks, MD;  Location: [facility];  Service: Cardiovascular;  Laterality: N/A;   ESOPHAGOGASTRODUODENOSCOPY (EGD) WITH PROPOFOL N/A 12/29/2017   Procedure: ESOPHAGOGASTRODUODENOSCOPY (EGD) WITH PROPOFOL;  Surgeon: [name], MD;  [facility];  Service: Endoscopy;  Laterality: N/A;   FRACTURE SURGERY     INSERT / REPLACE / REMOVE PACEMAKER  1999   INTRAMEDULLARY (IM) NAIL INTERTROCHANTERIC Left 04/04/2021   Procedure: INTRAMEDULLARY (IM) NAIL INTERTROCHANTRIC;  Surgeon: Vanetta Mulders, MD;  Location: [facility];  Service: Orthopedics;  Laterality: Left;   IR FLUORO GUIDE CV LINE LEFT  04/11/2021   IR US GUIDE VASC ACCESS LEFT  04/11/2021   KNEE ARTHROSCOPY Bilateral    LEFT HEART CATH AND CORS/GRAFTS ANGIOGRAPHY N/A 12/09/2017   Procedure: LEFT HEART CATH AND CORS/GRAFTS ANGIOGRAPHY;  Surgeon: Troy Sine, MD;  Location: [facility];  Service: Cardiovascular;  Laterality: N/A;   LEFT HEART CATHETERIZATION WITH CORONARY ANGIOGRAM N/A 07/23/2012   Procedure: LEFT HEART CATHETERIZATION WITH CORONARY ANGIOGRAM;  Surgeon: Leonie Man, MD;  Location: [facility];  Service: Cardiovascular;  Laterality: N/A;   LEXISCAN MYOVIEW  11/14/2011   Mild-moderate defect seen in Mid Inferolateral and Mid Anterolateral regions-consistant w/ infarct/scar. No significant ischemia demonstrated.   POLYPECTOMY  12/29/2017   Procedure: POLYPECTOMY;  Surgeon: [name], MD;  [facility];  Service: Endoscopy;;   PPM GENERATOR CHANGEOUT N/A 12/31/2020   Procedure: PPM GENERATOR CHANGEOUT;  Surgeon: Sanda Klein, MD;  Location: [place] [facility];  Service: Cardiovascular;  Laterality: N/A;   RIGHT/LEFT HEART CATH AND CORONARY ANGIOGRAPHY N/A 10/09/2016   Procedure: RIGHT/LEFT HEART CATH AND  CORONARY ANGIOGRAPHY;  Surgeon: Jolaine Artist, MD;  Location: [place] [facility] LAB;  Service: Cardiovascular;  Laterality: N/A;   RIGHT/LEFT HEART CATH AND CORONARY/GRAFT ANGIOGRAPHY N/A 03/11/2019   Procedure: RIGHT/LEFT HEART CATH AND CORONARY/GRAFT ANGIOGRAPHY;  Surgeon: Jolaine Artist, MD;  Location: [place] [facility];  Service: Cardiovascular;  Laterality: N/A;   TEE WITHOUT CARDIOVERSION N/A 11/29/2014   Procedure: TRANSESOPHAGEAL ECHOCARDIOGRAM (TEE);  Surgeon: Melrose Nakayama, MD;  Location: [facility];  Service: Open Heart Surgery;  Laterality: N/A;   TRANSCAROTID ARTERY REVASCULARIZATION  Right 10/01/2020   Procedure: RIGHT TRANSCAROTID ARTERY REVASCULARIZATION;  Surgeon: [name], MD;  Location: [place];  Service: Vascular;  Laterality: Right;   TRANSTHORACIC ECHOCARDIOGRAM  07/23/2012   EF 55-60%, normal-mild   TUBAL LIGATION     ULTRASOUND GUIDANCE FOR VASCULAR ACCESS Left 10/01/2020   Procedure: ULTRASOUND GUIDANCE FOR VASCULAR ACCESS;  Surgeon: [name], MD;  Location: [facility];  Service: Vascular;  Laterality: Left;    Allergies  Allergies  Allergen Reactions   Ivp Dye [Iodinated Contrast Media] Shortness Of Breath    No reaction to PO contrast with non-ionic dye.06-25-2014/rsm  Shellfish Allergy Anaphylaxis   Sulfa Antibiotics Shortness Of Breath   Iodine Hives   Atorvastatin Other (See Comments)    Pt states "causes bilateral leg pain/cramps."   Benadryl [Diphenhydramine] Other (See Comments)    "THIS DRIVES ME CRAZY AND MAKES ME FEEL LIKE I AM DYING"    Colchicine Nausea And Vomiting   Doxycycline Other (See Comments)    Unknown reaction, per patient   Uloric [Febuxostat] Other (See Comments)    Unknown reaction   Cephalexin Itching and Rash   Zithromax [Azithromycin] Rash    History of Present Illness    [name] has a PMH of chronic diastolic CHF, coronary artery disease status post CABG, complete heart block status post  PPM, and ESRD on dialysis.  She presented to the hospital on 08/04/2021 and was discharged on 08/15/2021.  She was diagnosed with acute on chronic hypoxic respiratory failure due to acute combined CHF.  She received IV diuresis and was transitioned to furosemide 160 mg daily.  Her renal function stabilized.  She continues to follow with nephrology.  Her respiratory status improved and she was discharged home on 2 L nasal cannula.  She presents to the clinic today for follow-up evaluation states she has been continuing to monitor her weights at home.  Her weight at discharge was around 206.8 pounds.  Her weight today is 203 pounds.  She has been trying to monitor the salt in her diet.  She is requiring 2 L nasal cannula today presents in a wheelchair.  She was seen by physical therapy for evaluation yesterday and will begin physical therapy next week.  We reviewed the importance of contacting the office with weight increase of 2-3 pounds overnight or 5 pounds in a week.  She presents with her son who expressed understanding.  I will have her begin to wear lower extremity support stockings through the day at home.  We will give her a salty 6 diet sheet, have her continue to increase her physical activity as tolerated, and plan follow-up in 3 to 4 months.  I also gave a recommendation for fluid restriction.  Today she denies chest pain, shortness of breath, lower extremity edema, fatigue, palpitations, melena, hematuria, hemoptysis, diaphoresis, weakness, presyncope, syncope, orthopnea, and PND.    Home Medications    Prior to Admission medications   Medication Sig Start Date End Date Taking? Authorizing Provider  ACCU-CHEK AVIVA PLUS test strip Check blood sugar twice daily 10/19/19   [provider]  Accu-Chek Softclix Lancets lancets Check blood sugar twice daily 08/10/19   [provider]  acetaminophen (TYLENOL) 500 MG tablet Take 1,000 mg by mouth 3 (three) times daily as needed (for  pain or headaches).    [provider]  albuterol (PROVENTIL HFA;VENTOLIN HFA) 108 (90 Base) MCG/ACT inhaler Inhale 2 puffs into the lungs every 6 (six) hours as needed for wheezing or shortness of breath. 05/02/18   Fuller Plan A, MD  albuterol (PROVENTIL) (2.5 MG/3ML) 0.083% nebulizer solution Take 3 mLs (2.5 mg total) by nebulization every 6 (six) hours as needed for wheezing or shortness of breath. Patient not taking: Reported on 08/05/2021 05/02/18   Norval Morton, MD  Alcohol Swabs (B-D SINGLE USE SWABS REGULAR) PADS  08/10/19   [provider]  allopurinol (ZYLOPRIM) 100 MG tablet Take 1 tablet (100 mg total) by mouth daily. Patient taking differently: Take 100 mg by mouth every morning. 03/14/21   Croitoru, Dani Gobble, MD  aspirin EC 81 MG  tablet Take 1 tablet (81 mg total) by mouth daily. Patient taking differently: Take 81 mg by mouth every morning. 03/27/16   Erlene Quan, PA-C  budesonide-formoterol (SYMBICORT) 160-4.5 MCG/ACT inhaler Inhale 2 puffs into the lungs 2 (two) times daily.    [provider]  clopidogrel (PLAVIX) 75 MG tablet Take 1 tablet (75 mg total) by mouth daily. Patient taking differently: Take 75 mg by mouth every morning. 02/28/21   Croitoru, [name], MD  cycloSPORINE (RESTASIS) 0.05 % ophthalmic emulsion Place 1 drop into both eyes 2 (two) times daily.    [provider]  docusate sodium (COLACE) 100 MG capsule Take 100 mg by mouth at bedtime.    [provider]  empagliflozin (JARDIANCE) 10 MG TABS tablet Take 10 mg by mouth daily before breakfast. Patient not taking: Reported on 06/11/2021 06/30/19   Bensimhon, Shaune Pascal, MD  EPINEPHrine 0.3 mg/0.3 mL IJ SOAJ injection Inject 0.3 mg into the muscle once as needed for anaphylaxis (severe allergic reaction).    [provider]  furosemide (LASIX) 80 MG tablet Take 2 tablets (160 mg total) by mouth daily. 08/16/21 10/15/21  Caren Griffins, MD  hydrALAZINE (APRESOLINE) 25 MG  tablet Take 1 tablet (25 mg total) by mouth every 8 (eight) hours. 08/15/21   Caren Griffins, MD  hydrOXYzine (ATARAX) 25 MG tablet Take 25 mg by mouth 2 (two) times daily as needed for itching. 06/07/21   [provider]  isosorbide mononitrate (IMDUR) 30 MG 24 hr tablet TAKE 3 TABLETS BY MOUTH  DAILY Patient taking differently: Take 90 mg by mouth every morning. 05/15/21   Croitoru, [name], MD  Lidocaine 0.5 % AERO Place 1 spray rectally every 6 (six) hours as needed (hemorrhoids). Preparation H rapid relief lidocaine spray    [provider]  linaclotide (LINZESS) 145 MCG CAPS capsule Take 145 mcg by mouth daily before breakfast. Patient not taking: Reported on 08/05/2021    [provider]  metoprolol succinate (TOPROL-XL) 50 MG 24 hr tablet Take 1 tablet (50 mg total) by mouth daily. Take with or immediately following a meal. 08/16/21   Gherghe, Vella Redhead, MD  nitroGLYCERIN (NITROSTAT) 0.4 MG SL tablet Place 1 tablet (0.4 mg total) under the tongue every 5 (five) minutes as needed for chest pain. 11/13/16 03/18/22  Erlene Quan, PA-C  ondansetron (ZOFRAN) 4 MG tablet Take 4 mg by mouth daily as needed for nausea or vomiting. 07/24/21   [provider]  OXYGEN Inhale 2-3 L/min into the lungs continuous.    [provider]  oxymetazoline (AFRIN) 0.05 % nasal spray Place 1 spray into both nostrils 2 (two) times daily as needed for congestion.    [provider]  pantoprazole (PROTONIX) 40 MG tablet Take 1 tablet (40 mg total) by mouth 2 (two) times daily. 07/08/19   [name], MD  polyethylene glycol (MIRALAX / GLYCOLAX) 17 g packet Take 17 g by mouth daily as needed (constipation).    [provider]  predniSONE (DELTASONE) 10 MG tablet Take 1 tablet (10 mg total) by mouth daily with breakfast. 08/15/21   Caren Griffins, MD  rosuvastatin (CRESTOR) 20 MG tablet Take 1 tablet (20 mg total) by mouth every evening. NEED OV. Patient not  taking: Reported on 08/05/2021 06/25/21   Croitoru, [name], MD  senna-docusate (SENOKOT-S) 8.6-50 MG tablet Take 1 tablet by mouth 2 (two) times daily. 04/28/21   Caren Griffins, MD  sucralfate (CARAFATE) 1 g  tablet Take 1 tablet (1 g total) by mouth 4 (four) times daily -  with meals and at bedtime. Patient taking differently: Take 2 g by mouth 2 (two) times daily. 04/28/21   Caren Griffins, MD  traMADol (ULTRAM) 50 MG tablet Take 50 mg by mouth daily as needed (pain). 06/07/21   [provider]  vitamin B-12 (CYANOCOBALAMIN) 1000 MCG tablet Take 1 tablet (1,000 mcg total) by mouth daily. 08/15/21   Caren Griffins, MD    Family History    Family History  Problem Relation Age of Onset   Breast cancer Mother    Diabetes Mother    Heart disease Maternal Grandmother    Kidney disease Maternal Grandmother    Diabetes Maternal Grandmother    Glaucoma Maternal Aunt    Heart disease Maternal Aunt    Asthma Sister    Colon cancer Neg Hx    Stomach cancer Neg Hx    Pancreatic cancer Neg Hx    She indicated that the status of her mother is unknown. She indicated that the status of her sister is unknown. She indicated that her maternal grandmother is deceased. She indicated that her maternal grandfather is deceased. She indicated that her paternal grandmother is deceased. She indicated that her paternal grandfather is deceased. She indicated that the status of her neg hx is unknown.  Social History    Social History   Socioeconomic History   Marital status: Widowed    Spouse name: Ethal Gotay- deceased   Number of children: 5   Years of education: Not on file   Highest education level: Not on file  Occupational History   Occupation: STAFF/BUFFET    Employer: [employer]  Tobacco Use   Smoking status: Former    Packs/day: 0.25    Years: 3.00    Total pack years: 0.75    Types: Cigarettes    Quit date: 02/11/1968    Years since quitting: 53.5    Smokeless tobacco: Never  Vaping Use   Vaping Use: Never used  Substance and Sexual Activity   Alcohol use: No   Drug use: No   Sexual activity: Never  Other Topics Concern   Not on file  Social History Narrative   Widowed last year. Spouse Mike Gip Sr (37) died on date 01-24-12 May 10, 2022) at his home Was a [place] [employer] -[place] [employer]   She lives with her two sons, Thayer Ohm    Other sons Haze Rushing   Social Determinants of Health   Financial Resource Strain: High Risk (12/18/2016)   Overall Financial Resource Strain (CARDIA)    Difficulty of Paying Living Expenses: Very hard  Food Insecurity: No Food Insecurity (01/15/2021)   Hunger Vital Sign    Worried About Running Out of Food in the Last Year: Never true    Magazine in the Last Year: Never true  Transportation Needs: No Transportation Needs (02/05/2021)   PRAPARE - Hydrologist (Medical): No    Lack of Transportation (Non-Medical): No  Physical Activity: Inactive (12/18/2016)   Exercise Vital Sign    Days of Exercise per Week: 0 days    Minutes of Exercise per Session: 0 min  Stress: Stress Concern Present (12/18/2016)   [place]    Feeling of Stress : Very much  Social Connections: Moderately Integrated (02/05/2021)   Social Connection and Isolation  Panel [NHANES]    Frequency of Communication with Friends and Family: Three times a week    Frequency of Social Gatherings with Friends and Family: Three times a week    Attends Religious Services: 1 to 4 times per year    Active Member of Clubs or Organizations: Yes    Attends Archivist Meetings: 1 to 4 times per year    Marital Status: Widowed  Intimate Partner Violence: Not At Risk (02/05/2021)   Humiliation, Afraid, Rape, and Kick questionnaire    Fear of Current or Ex-Partner: No    Emotionally Abused: No    Physically Abused:  No    Sexually Abused: No     Review of Systems    General:  No chills, fever, night sweats or weight changes.  Cardiovascular:  No chest pain, dyspnea on exertion, edema, orthopnea, palpitations, paroxysmal nocturnal dyspnea. Dermatological: No rash, lesions/masses Respiratory: No cough, dyspnea Urologic: No hematuria, dysuria Abdominal:   No nausea, vomiting, diarrhea, bright red blood per rectum, melena, or hematemesis Neurologic:  No visual changes, wkns, changes in mental status. All other systems reviewed and are otherwise negative except as noted above.  Physical Exam    VS:  BP 90/68   Pulse 65   Ht '5\' 2"'$  (1.575 m)   Wt 203 lb (92.1 kg)   SpO2 90%   BMI 37.13 kg/m  , BMI Body mass index is 37.13 kg/m. GEN: Well nourished, well developed, in no acute distress. HEENT: normal. Neck: Supple, no JVD, carotid bruits, or masses. Cardiac: RRR, no murmurs, rubs, or gallops. No clubbing, cyanosis, generalized nonpitting bilateral lower extremity edema.  Radials/DP/PT 2+ and equal bilaterally.  Respiratory:  Respirations regular and unlabored, clear to auscultation bilaterally. GI: Soft, nontender, nondistended, BS + x 4. MS: no deformity or atrophy.  His Skin: warm and dry, no rash.  Slight bilateral ankle and shin erythema Neuro:  Strength and sensation are intact. Psych: Normal affect.  Accessory Clinical Findings    Recent Labs: 08/11/2021: B Natriuretic Peptide 1,471.9 08/14/2021: ALT 16; Hemoglobin 9.3; Magnesium 2.3; Platelets 163 08/15/2021: BUN 88; Creatinine, Ser 3.06; Potassium 3.6; Sodium 139   Recent Lipid Panel    Component Value Date/Time   CHOL 189 08/08/2021 0412   CHOL 186 01/21/2019 1045   TRIG 97 08/08/2021 0412   HDL 51 08/08/2021 0412   HDL 44 01/21/2019 1045   CHOLHDL 3.7 08/08/2021 0412   VLDL 19 08/08/2021 0412   LDLCALC 119 (H) 08/08/2021 0412   LDLCALC 99 01/21/2019 1045    ECG personally reviewed by me today-none today.  Echocardiogram  08/05/2021  IMPRESSIONS     1. Technically difficult study. Left ventricular ejection fraction, by  estimation, is 45 to 50%. The left ventricle has mildly decreased  function. The left ventricle demonstrates regional wall motion  abnormalities (see scoring diagram/findings for  description). Apical hypokinesis. There is mild left ventricular  hypertrophy. Left ventricular diastolic parameters are indeterminate.   2. Right ventricular systolic function is normal. The right ventricular  size is normal. There is mildly elevated pulmonary artery systolic  pressure. The estimated right ventricular systolic pressure is 41.9 mmHg.   3. The mitral valve is degenerative. No evidence of mitral valve  regurgitation. Moderate mitral annular calcification.   4. The aortic valve was not well visualized. There is moderate  calcification of the aortic valve. Aortic valve regurgitation is trivial.  Mild aortic valve stenosis. Vmax 2.5 m/s, MG 13 mmHg, AVA 2.0 cm^2,  DI  0.48   5. The inferior vena cava is dilated in size with <50% respiratory  variability, suggesting right atrial pressure of 15 mmHg.  Assessment & Plan   1.  Acute on chronic combined systolic and diastolic CHF-weight stable.  Bilateral lower extremity generalized nonpitting edema.  NYHA class II-3.  Presents in wheelchair today with 2 L nasal cannula.  Bilateral lower extremity generalized nonpitting edema, slight erythema bilateral ankle and shin.  No open areas or signs of infection. Continue Furosemide, metoprolol Heart healthy low-sodium diet-salty 6 given Increase physical activity as tolerated Daily weights-contact office with a weight increase of 2 to 3 pounds overnight or 5 pounds in weight Lower extremity support stockings-[place] support stocking sheet given Elevate lower extremities when not active Fluid restriction 50 ounces or less daily  Coronary artery disease-denies recent episodes of chest discomfort.  Status post  CABG. Continue metoprolol, rosuvastatin,  Imdur Heart healthy low-sodium diet-salty 6 given Increase physical activity as tolerated  Hyperlipidemia-LDL 119 08/08/21 Continue rosuvastatin, aspirin Heart healthy low-sodium diet-salty 6 given Increase physical activity as tolerated  Essential hypertension-BP today 90/68 Continue metoprolol, Imdur Heart healthy low-sodium diet-salty 6 given Increase physical activity as tolerated  OSA-reports compliance with CPAP.  Waking up well rested. Continue CPAP use  Obesity-weight today 203.  Reviewed benefits of weight loss. Increase physical activity as tolerated Weight loss encouraged  Disposition: Follow-up with Dr. [name] in 3-4 months.   [name]. Nobuko Gsell NP-C     08/21/2021, 2:35 PM [place] [address] Office [phone] Fax [phone]  Notice: This dictation was prepared with Dragon dictation along with smaller phrase technology. Any transcriptional errors that result from this process are unintentional and may not be corrected upon review.  I spent 14 minutes examining this patient, reviewing medications, and using patient centered shared decision making involving her cardiac care.  Prior to her visit I spent greater than 20 minutes reviewing her past medical history,  medications, and prior cardiac tests.

## 2021-08-20 NOTE — Telephone Encounter (Signed)
Right foot swollen since yesterday. The foot is tight. She avoids sodium and elevated leg when sitting. She is scheduled for [facility] on 7/12. Stated she has cellulitis on rt leg and has a place that opened. She treated it with neosporin.

## 2021-08-21 ENCOUNTER — Other Ambulatory Visit: Payer: Self-pay

## 2021-08-21 ENCOUNTER — Encounter: Payer: Self-pay | Admitting: General Practice

## 2021-08-21 ENCOUNTER — Ambulatory Visit (INDEPENDENT_AMBULATORY_CARE_PROVIDER_SITE_OTHER): Payer: Medicare Other | Admitting: General Practice

## 2021-08-21 VITALS — BP 90/68 | HR 65 | Ht 62.0 in | Wt 203.0 lb

## 2021-08-21 DIAGNOSIS — I959 Hypotension, unspecified: Secondary | ICD-10-CM | POA: Diagnosis not present

## 2021-08-21 DIAGNOSIS — J45909 Unspecified asthma, uncomplicated: Secondary | ICD-10-CM | POA: Diagnosis not present

## 2021-08-21 DIAGNOSIS — G4733 Obstructive sleep apnea (adult) (pediatric): Secondary | ICD-10-CM | POA: Diagnosis not present

## 2021-08-21 DIAGNOSIS — N186 End stage renal disease: Secondary | ICD-10-CM | POA: Diagnosis not present

## 2021-08-21 DIAGNOSIS — E782 Mixed hyperlipidemia: Secondary | ICD-10-CM | POA: Diagnosis not present

## 2021-08-21 DIAGNOSIS — I5032 Chronic diastolic (congestive) heart failure: Secondary | ICD-10-CM | POA: Diagnosis not present

## 2021-08-21 DIAGNOSIS — K589 Irritable bowel syndrome without diarrhea: Secondary | ICD-10-CM | POA: Diagnosis not present

## 2021-08-21 DIAGNOSIS — J9621 Acute and chronic respiratory failure with hypoxia: Secondary | ICD-10-CM | POA: Diagnosis not present

## 2021-08-21 DIAGNOSIS — K760 Fatty (change of) liver, not elsewhere classified: Secondary | ICD-10-CM | POA: Diagnosis not present

## 2021-08-21 DIAGNOSIS — E1122 Type 2 diabetes mellitus with diabetic chronic kidney disease: Secondary | ICD-10-CM | POA: Diagnosis not present

## 2021-08-21 DIAGNOSIS — I10 Essential (primary) hypertension: Secondary | ICD-10-CM | POA: Diagnosis not present

## 2021-08-21 DIAGNOSIS — I428 Other cardiomyopathies: Secondary | ICD-10-CM | POA: Diagnosis not present

## 2021-08-21 DIAGNOSIS — I5043 Acute on chronic combined systolic (congestive) and diastolic (congestive) heart failure: Secondary | ICD-10-CM | POA: Diagnosis not present

## 2021-08-21 DIAGNOSIS — D519 Vitamin B12 deficiency anemia, unspecified: Secondary | ICD-10-CM | POA: Diagnosis not present

## 2021-08-21 DIAGNOSIS — G8929 Other chronic pain: Secondary | ICD-10-CM | POA: Diagnosis not present

## 2021-08-21 DIAGNOSIS — I132 Hypertensive heart and chronic kidney disease with heart failure and with stage 5 chronic kidney disease, or end stage renal disease: Secondary | ICD-10-CM | POA: Diagnosis not present

## 2021-08-21 DIAGNOSIS — D696 Thrombocytopenia, unspecified: Secondary | ICD-10-CM | POA: Diagnosis not present

## 2021-08-21 DIAGNOSIS — I25118 Atherosclerotic heart disease of native coronary artery with other forms of angina pectoris: Secondary | ICD-10-CM

## 2021-08-21 DIAGNOSIS — E7800 Pure hypercholesterolemia, unspecified: Secondary | ICD-10-CM | POA: Diagnosis not present

## 2021-08-21 DIAGNOSIS — M109 Gout, unspecified: Secondary | ICD-10-CM | POA: Diagnosis not present

## 2021-08-21 DIAGNOSIS — I2510 Atherosclerotic heart disease of native coronary artery without angina pectoris: Secondary | ICD-10-CM | POA: Diagnosis not present

## 2021-08-21 DIAGNOSIS — M1990 Unspecified osteoarthritis, unspecified site: Secondary | ICD-10-CM | POA: Diagnosis not present

## 2021-08-21 DIAGNOSIS — I480 Paroxysmal atrial fibrillation: Secondary | ICD-10-CM | POA: Diagnosis not present

## 2021-08-21 DIAGNOSIS — M545 Low back pain, unspecified: Secondary | ICD-10-CM | POA: Diagnosis not present

## 2021-08-21 DIAGNOSIS — K219 Gastro-esophageal reflux disease without esophagitis: Secondary | ICD-10-CM | POA: Diagnosis not present

## 2021-08-21 DIAGNOSIS — I442 Atrioventricular block, complete: Secondary | ICD-10-CM | POA: Diagnosis not present

## 2021-08-21 DIAGNOSIS — E662 Morbid (severe) obesity with alveolar hypoventilation: Secondary | ICD-10-CM

## 2021-08-21 DIAGNOSIS — B3731 Acute candidiasis of vulva and vagina: Secondary | ICD-10-CM | POA: Diagnosis not present

## 2021-08-21 NOTE — Telephone Encounter (Signed)
Patient stated she is not on ABX and does not have fever/chills. She is getting blood work today and has appointment with Thomasene Mohair.

## 2021-08-21 NOTE — Patient Instructions (Signed)
Medication Instructions:  The current medical regimen is effective;  continue present plan and medications as directed. Please refer to the Current Medication list given to you today.   *If you need a refill on your cardiac medications before your next appointment, please call your pharmacy*  Lab Work:   Testing/Procedures:  NONE    NONE  If you have labs (blood work) drawn today and your tests are completely normal, you will receive your results only by: [place] (if you have MyChart) OR  A paper copy in the mail If you have any lab test that is abnormal or we need to change your treatment, we will call you to review the results.  Special Instructions PLEASE READ AND FOLLOW SALTY 6-ATTACHED-1,'800mg'$  daily  FLUID RESTRICTION <50 OUNCES DAILY  TAKE AND LOG YOUR WEIGHT DAILY  ELEVATE LEGS WHEN SITTING, ABOVE YOUR CHEST  WEAR COMPRESSION STOCKINGS-SEE BELOW  Follow-Up: Your next appointment:  3-4 month(s) In Person with Sanda Klein, MD or JESSE CLEAVER, FNP-C   Please call our office 2 months in advance to schedule this appointment  :1  At [facility], you and your health needs are our priority.  As part of our continuing mission to provide you with exceptional heart care, we have created designated Provider Care Teams.  These Care Teams include your primary Cardiologist (physician) and Advanced Practice Providers (APPs -  Physician Assistants and Nurse Practitioners) who all work together to provide you with the care you need, when you need it.  We recommend signing up for the patient portal called "MyChart".  Sign up information is provided on this After Visit Summary.  MyChart is used to connect with patients for Virtual Visits (Telemedicine).  Patients are able to view lab/test results, encounter notes, upcoming appointments, etc.  Non-urgent messages can be sent to your provider as well.   To learn more about what you can do with MyChart, go to [email or web address].     Important Information About Sugar             6 SALTY THINGS TO AVOID     1,'800MG'$  DAILY     PLEASE PURCHASE AND WEAR COMPRESSION STOCKINGS DAILY AND TAKE OFF AT BEDTIME. Compression stockings are elastic socks that squeeze the legs. They help to increase blood flow to the legs and to decrease swelling in the legs from fluid retention, and reduce the chance of developing blood clots in the lower legs. Please put on in the AM when dressing and off at night when dressing for bed.  LET THEM KNOW THAT YOU NEED KNEE HIGH'S WITH COMPRESSION OF 15-20 mmhg.  [employer]  [place] ([phone]); [place], [place] [address]; [phone]  EMAIL   [email or web address].  PLEASE MAKE SURE TO ELEVATE YOUR FEET & LEGS WHILE SITTING, THIS WILL HELP WITH THE SWELLING ALSO.

## 2021-08-22 LAB — BASIC METABOLIC PANEL
BUN/Creatinine Ratio: 25 (ref 12–28)
BUN: 86 mg/dL (ref 8–27)
CO2: 34 mmol/L — ABNORMAL HIGH (ref 20–29)
Calcium: 9.8 mg/dL (ref 8.7–10.3)
Chloride: 91 mmol/L — ABNORMAL LOW (ref 96–106)
Creatinine, Ser: 3.4 mg/dL — ABNORMAL HIGH (ref 0.57–1.00)
Glucose: 160 mg/dL — ABNORMAL HIGH (ref 70–99)
Potassium: 3.6 mmol/L (ref 3.5–5.2)
Sodium: 142 mmol/L (ref 134–144)
eGFR: 13 mL/min/{1.73_m2} — ABNORMAL LOW (ref >59)

## 2021-08-23 ENCOUNTER — Telehealth: Payer: Self-pay | Admitting: Physician Assistant

## 2021-08-23 ENCOUNTER — Other Ambulatory Visit: Payer: Self-pay

## 2021-08-23 ENCOUNTER — Telehealth: Payer: Self-pay | Admitting: Gastroenterology

## 2021-08-23 DIAGNOSIS — R197 Diarrhea, unspecified: Secondary | ICD-10-CM

## 2021-08-23 DIAGNOSIS — R1013 Epigastric pain: Secondary | ICD-10-CM

## 2021-08-23 NOTE — Telephone Encounter (Signed)
Patient called in with complaints of intermittent, cramping generalized abdominal pain (7/8) that radiates to her back, frequent diarrhea, and nausea w/o vomiting for 2 days. She says she did take an imodium last night which helped. No diarrhea today, only pain & nausea. She currently takes pantoprazole, carafate, and linzess as prescribed at her last OV 05/27/21. She does take tramadol & tylenol PRN, but it has only helped relieve the pain some. She was recently discharged from [facility] on 08/15/21 for HF & Cellutlitis, and was on antibiotics. Follow up scheduled for 09/20/21 at 3:00 pm with Estill Bamberg, [place]. Patient advised that if she develops any worsening symptoms or severe pain she should go to ED to be further evaluated.

## 2021-08-23 NOTE — Telephone Encounter (Signed)
Spoke with patient regarding PA recommendations. She states her belly button is also sticking out further than it usually is. Denies any abdominal distention, just her belly button. Pt advised to go to ED if symptoms worsen & to try to have all lab work and xray completed Monday so PA can provide further recommendations. Pt verbalized all understanding.

## 2021-08-24 DIAGNOSIS — I480 Paroxysmal atrial fibrillation: Secondary | ICD-10-CM | POA: Diagnosis not present

## 2021-08-24 DIAGNOSIS — K760 Fatty (change of) liver, not elsewhere classified: Secondary | ICD-10-CM | POA: Diagnosis not present

## 2021-08-24 DIAGNOSIS — J45909 Unspecified asthma, uncomplicated: Secondary | ICD-10-CM | POA: Diagnosis not present

## 2021-08-24 DIAGNOSIS — I5043 Acute on chronic combined systolic (congestive) and diastolic (congestive) heart failure: Secondary | ICD-10-CM | POA: Diagnosis not present

## 2021-08-24 DIAGNOSIS — I959 Hypotension, unspecified: Secondary | ICD-10-CM | POA: Diagnosis not present

## 2021-08-24 DIAGNOSIS — I132 Hypertensive heart and chronic kidney disease with heart failure and with stage 5 chronic kidney disease, or end stage renal disease: Secondary | ICD-10-CM | POA: Diagnosis not present

## 2021-08-24 DIAGNOSIS — M545 Low back pain, unspecified: Secondary | ICD-10-CM | POA: Diagnosis not present

## 2021-08-24 DIAGNOSIS — M1990 Unspecified osteoarthritis, unspecified site: Secondary | ICD-10-CM | POA: Diagnosis not present

## 2021-08-24 DIAGNOSIS — G4733 Obstructive sleep apnea (adult) (pediatric): Secondary | ICD-10-CM | POA: Diagnosis not present

## 2021-08-24 DIAGNOSIS — I428 Other cardiomyopathies: Secondary | ICD-10-CM | POA: Diagnosis not present

## 2021-08-24 DIAGNOSIS — D696 Thrombocytopenia, unspecified: Secondary | ICD-10-CM | POA: Diagnosis not present

## 2021-08-24 DIAGNOSIS — E7800 Pure hypercholesterolemia, unspecified: Secondary | ICD-10-CM | POA: Diagnosis not present

## 2021-08-24 DIAGNOSIS — N186 End stage renal disease: Secondary | ICD-10-CM | POA: Diagnosis not present

## 2021-08-24 DIAGNOSIS — J9621 Acute and chronic respiratory failure with hypoxia: Secondary | ICD-10-CM | POA: Diagnosis not present

## 2021-08-24 DIAGNOSIS — B3731 Acute candidiasis of vulva and vagina: Secondary | ICD-10-CM | POA: Diagnosis not present

## 2021-08-24 DIAGNOSIS — K219 Gastro-esophageal reflux disease without esophagitis: Secondary | ICD-10-CM | POA: Diagnosis not present

## 2021-08-24 DIAGNOSIS — I2510 Atherosclerotic heart disease of native coronary artery without angina pectoris: Secondary | ICD-10-CM | POA: Diagnosis not present

## 2021-08-24 DIAGNOSIS — I442 Atrioventricular block, complete: Secondary | ICD-10-CM | POA: Diagnosis not present

## 2021-08-24 DIAGNOSIS — E1122 Type 2 diabetes mellitus with diabetic chronic kidney disease: Secondary | ICD-10-CM | POA: Diagnosis not present

## 2021-08-24 DIAGNOSIS — K589 Irritable bowel syndrome without diarrhea: Secondary | ICD-10-CM | POA: Diagnosis not present

## 2021-08-24 DIAGNOSIS — D519 Vitamin B12 deficiency anemia, unspecified: Secondary | ICD-10-CM | POA: Diagnosis not present

## 2021-08-24 DIAGNOSIS — G8929 Other chronic pain: Secondary | ICD-10-CM | POA: Diagnosis not present

## 2021-08-24 DIAGNOSIS — M109 Gout, unspecified: Secondary | ICD-10-CM | POA: Diagnosis not present

## 2021-08-27 ENCOUNTER — Ambulatory Visit (INDEPENDENT_AMBULATORY_CARE_PROVIDER_SITE_OTHER)
Admission: RE | Admit: 2021-08-27 | Discharge: 2021-08-27 | Disposition: A | Discharge status: Home / Self Care | Payer: Medicare Other | Source: Ambulatory Visit | Attending: Physician Assistant | Admitting: Physician Assistant

## 2021-08-27 ENCOUNTER — Other Ambulatory Visit (INDEPENDENT_AMBULATORY_CARE_PROVIDER_SITE_OTHER): Payer: Medicare Other

## 2021-08-27 DIAGNOSIS — R197 Diarrhea, unspecified: Secondary | ICD-10-CM | POA: Diagnosis not present

## 2021-08-27 DIAGNOSIS — K5900 Constipation, unspecified: Secondary | ICD-10-CM | POA: Diagnosis not present

## 2021-08-27 DIAGNOSIS — R1013 Epigastric pain: Secondary | ICD-10-CM | POA: Diagnosis not present

## 2021-08-27 LAB — COMPREHENSIVE METABOLIC PANEL
ALT: 11 U/L (ref 0–35)
AST: 13 U/L (ref 0–37)
Albumin: 3.7 g/dL (ref 3.5–5.2)
Alkaline Phosphatase: 91 U/L (ref 39–117)
BUN: 96 mg/dL (ref 6–23)
CO2: 38 mEq/L — ABNORMAL HIGH (ref 19–32)
Calcium: 9.9 mg/dL (ref 8.4–10.5)
Chloride: 89 mEq/L — ABNORMAL LOW (ref 96–112)
Creatinine, Ser: 3.89 mg/dL — ABNORMAL HIGH (ref 0.40–1.20)
GFR: 10.66 mL/min — CL (ref >60.00)
Glucose, Bld: 120 mg/dL — ABNORMAL HIGH (ref 70–99)
Potassium: 2.9 mEq/L — ABNORMAL LOW (ref 3.5–5.1)
Sodium: 137 mEq/L (ref 135–145)
Total Bilirubin: 0.4 mg/dL (ref 0.2–1.2)
Total Protein: 6.5 g/dL (ref 6.0–8.3)

## 2021-08-27 LAB — CBC WITH DIFFERENTIAL/PLATELET
Basophils Absolute: 0 10*3/uL (ref 0.0–0.1)
Basophils Relative: 0.7 % (ref 0.0–3.0)
Eosinophils Absolute: 0.2 10*3/uL (ref 0.0–0.7)
Eosinophils Relative: 2.4 % (ref 0.0–5.0)
HCT: 29 % — ABNORMAL LOW (ref 36.0–46.0)
Hemoglobin: 9.4 g/dL — ABNORMAL LOW (ref 12.0–15.0)
Lymphocytes Relative: 17.6 % (ref 12.0–46.0)
Lymphs Abs: 1.2 10*3/uL (ref 0.7–4.0)
MCHC: 32.4 g/dL (ref 30.0–36.0)
MCV: 95.8 fl (ref 78.0–100.0)
Monocytes Absolute: 0.5 10*3/uL (ref 0.1–1.0)
Monocytes Relative: 7.4 % (ref 3.0–12.0)
Neutro Abs: 4.9 10*3/uL (ref 1.4–7.7)
Neutrophils Relative %: 71.9 % (ref 43.0–77.0)
Platelets: 126 10*3/uL — ABNORMAL LOW (ref 150.0–400.0)
RBC: 3.02 Mil/uL — ABNORMAL LOW (ref 3.87–5.11)
RDW: 13.6 % (ref 11.5–15.5)
WBC: 6.9 10*3/uL (ref 4.0–10.5)

## 2021-08-27 NOTE — Telephone Encounter (Signed)
Spoke with patient & she states she is having abdominal pain. Patient is coming in today for KUB & labs. I've made her aware that once she has this completed, PA can provide more recommendations. In the meantime, she's been advised to go to ED if she's experiencing severe pain. Pt verbalized all understanding.

## 2021-08-27 NOTE — Telephone Encounter (Signed)
Patient called states she is in a lot of abdominal pain and requesting to speak with a nurse.

## 2021-08-28 ENCOUNTER — Telehealth: Payer: Self-pay | Admitting: Physician Assistant

## 2021-08-28 ENCOUNTER — Telehealth: Payer: Self-pay | Admitting: Cardiovascular Disease

## 2021-08-28 ENCOUNTER — Telehealth: Payer: Self-pay

## 2021-08-28 DIAGNOSIS — M545 Low back pain, unspecified: Secondary | ICD-10-CM | POA: Diagnosis not present

## 2021-08-28 DIAGNOSIS — M109 Gout, unspecified: Secondary | ICD-10-CM | POA: Diagnosis not present

## 2021-08-28 DIAGNOSIS — J45909 Unspecified asthma, uncomplicated: Secondary | ICD-10-CM | POA: Diagnosis not present

## 2021-08-28 DIAGNOSIS — I442 Atrioventricular block, complete: Secondary | ICD-10-CM | POA: Diagnosis not present

## 2021-08-28 DIAGNOSIS — G4733 Obstructive sleep apnea (adult) (pediatric): Secondary | ICD-10-CM | POA: Diagnosis not present

## 2021-08-28 DIAGNOSIS — I5043 Acute on chronic combined systolic (congestive) and diastolic (congestive) heart failure: Secondary | ICD-10-CM | POA: Diagnosis not present

## 2021-08-28 DIAGNOSIS — J9621 Acute and chronic respiratory failure with hypoxia: Secondary | ICD-10-CM | POA: Diagnosis not present

## 2021-08-28 DIAGNOSIS — I132 Hypertensive heart and chronic kidney disease with heart failure and with stage 5 chronic kidney disease, or end stage renal disease: Secondary | ICD-10-CM | POA: Diagnosis not present

## 2021-08-28 DIAGNOSIS — D519 Vitamin B12 deficiency anemia, unspecified: Secondary | ICD-10-CM | POA: Diagnosis not present

## 2021-08-28 DIAGNOSIS — I959 Hypotension, unspecified: Secondary | ICD-10-CM | POA: Diagnosis not present

## 2021-08-28 DIAGNOSIS — N186 End stage renal disease: Secondary | ICD-10-CM | POA: Diagnosis not present

## 2021-08-28 DIAGNOSIS — E7800 Pure hypercholesterolemia, unspecified: Secondary | ICD-10-CM | POA: Diagnosis not present

## 2021-08-28 DIAGNOSIS — B3731 Acute candidiasis of vulva and vagina: Secondary | ICD-10-CM | POA: Diagnosis not present

## 2021-08-28 DIAGNOSIS — I2510 Atherosclerotic heart disease of native coronary artery without angina pectoris: Secondary | ICD-10-CM | POA: Diagnosis not present

## 2021-08-28 DIAGNOSIS — G8929 Other chronic pain: Secondary | ICD-10-CM | POA: Diagnosis not present

## 2021-08-28 DIAGNOSIS — M1990 Unspecified osteoarthritis, unspecified site: Secondary | ICD-10-CM | POA: Diagnosis not present

## 2021-08-28 DIAGNOSIS — K760 Fatty (change of) liver, not elsewhere classified: Secondary | ICD-10-CM | POA: Diagnosis not present

## 2021-08-28 DIAGNOSIS — K589 Irritable bowel syndrome without diarrhea: Secondary | ICD-10-CM | POA: Diagnosis not present

## 2021-08-28 DIAGNOSIS — E1122 Type 2 diabetes mellitus with diabetic chronic kidney disease: Secondary | ICD-10-CM | POA: Diagnosis not present

## 2021-08-28 DIAGNOSIS — D696 Thrombocytopenia, unspecified: Secondary | ICD-10-CM | POA: Diagnosis not present

## 2021-08-28 DIAGNOSIS — I428 Other cardiomyopathies: Secondary | ICD-10-CM | POA: Diagnosis not present

## 2021-08-28 DIAGNOSIS — I480 Paroxysmal atrial fibrillation: Secondary | ICD-10-CM | POA: Diagnosis not present

## 2021-08-28 DIAGNOSIS — K219 Gastro-esophageal reflux disease without esophagitis: Secondary | ICD-10-CM | POA: Diagnosis not present

## 2021-08-28 NOTE — Telephone Encounter (Signed)
Spoke with pt and let her know she should follow up with her nephrologist. Pt states she has an appointment with her nephrologist this Monday at 2 pm.   Pt also mentioned she had been drinking electrolytes. Pt thought our office had advised that she take electrolytes. Pt said that the electrolytes she has been drinking are called "zero".  Pt stated that she has continued to have Left sided abd pain and wanted to know results from x-ray.

## 2021-08-28 NOTE — Telephone Encounter (Signed)
Patient reports that blood work yesterday showed low potassium (2.9) and wanted Dr. Lorenza Cambridge know. Patient's PCP put her on 23mq potassium for last night and this morning. Patient stated she took 40 mEq last night and wanted to know if she should take another 250m today. I recommended that she call her PCP to get instructions. Patient also reports stomach and back pain. She said her PCP recommended that she go to the ED if it got worse. Patient wanted to know if she should go to the ED because "they will keep me there." I recommended that she follow her PCP's instructions and go to the ED. She stated she will go tomorrow when her son can take her.

## 2021-08-28 NOTE — Telephone Encounter (Signed)
Returned patient's phone call. She stated she had her PT listen to her stomach & one side didn't sound as "digesting." She had questions as to what may be going on. I've made her aware that we are still awaiting on results & once we have those Estill Bamberg, [place] will review and provide further recommendations. ED precautions have been advised again. Pt verbalized all understanding.

## 2021-08-28 NOTE — Telephone Encounter (Signed)
Pt states that PCP recommended that she call to inform office that her Potassium is low as well as she's having terrible stomach and back pain. Pt would like for nurse to return call as soon as possible. Please advise

## 2021-08-28 NOTE — Telephone Encounter (Signed)
Pt also stated that she did speak with cardiology who advised her to keep follow up on Monday.

## 2021-08-28 NOTE — Telephone Encounter (Signed)
Spoke with pt and gave pt recommendations. Pt verbalized understanding and stated she would prefer to call back for bowel purge directions if Miralax and Linzess do not help. Also advised pt speak with Nephrologist to see if she should be drinking electrolytes and so they can stay up to date with what she is taking.

## 2021-08-28 NOTE — Telephone Encounter (Signed)
-----   Message from Lavena Bullion, DO sent at 08/27/2021  6:12 PM EDT ----- Received phone call from the lab with urgent result of BUN/creatinine 96/3.89. K2.9. Looks like the patient was just discharged from the hospital last week, with discharge BUN/creatinine 86/3.4.   Was being followed with Nephrology as an inpatient and on significant diuretics (Lasix 100 mg every morning and 80 mg every afternoon).  Looks like they were having a plan for her to follow-up with Dr. Joelyn Oms in the [facility]rology [facility] as an outpatient.  Did previously require inpatient hemodialysis and CRRT during admission in 03/2021 as well, which appears to have been continued through June.  Please plan to call patient in the morning and recommend that she follow-up with her Nephrologist.  Will defer electrolyte management, diuretic management, and repeat labs to her Nephrologist.  Thanks.

## 2021-08-28 NOTE — Telephone Encounter (Signed)
Patient called requesting a phone call from you.  She said it was very important and she needed to speak with you today.  Thank you.

## 2021-08-29 DIAGNOSIS — I2510 Atherosclerotic heart disease of native coronary artery without angina pectoris: Secondary | ICD-10-CM | POA: Diagnosis not present

## 2021-08-29 DIAGNOSIS — N184 Chronic kidney disease, stage 4 (severe): Secondary | ICD-10-CM | POA: Diagnosis not present

## 2021-08-29 DIAGNOSIS — E876 Hypokalemia: Secondary | ICD-10-CM | POA: Diagnosis not present

## 2021-08-29 DIAGNOSIS — D696 Thrombocytopenia, unspecified: Secondary | ICD-10-CM | POA: Diagnosis not present

## 2021-08-29 DIAGNOSIS — J9610 Chronic respiratory failure, unspecified whether with hypoxia or hypercapnia: Secondary | ICD-10-CM | POA: Diagnosis not present

## 2021-08-29 DIAGNOSIS — I429 Cardiomyopathy, unspecified: Secondary | ICD-10-CM | POA: Diagnosis not present

## 2021-08-29 DIAGNOSIS — I700 Atherosclerosis of aorta: Secondary | ICD-10-CM | POA: Diagnosis not present

## 2021-08-30 DIAGNOSIS — J9621 Acute and chronic respiratory failure with hypoxia: Secondary | ICD-10-CM | POA: Diagnosis not present

## 2021-08-30 DIAGNOSIS — I5043 Acute on chronic combined systolic (congestive) and diastolic (congestive) heart failure: Secondary | ICD-10-CM | POA: Diagnosis not present

## 2021-08-30 DIAGNOSIS — I959 Hypotension, unspecified: Secondary | ICD-10-CM | POA: Diagnosis not present

## 2021-08-30 DIAGNOSIS — E1122 Type 2 diabetes mellitus with diabetic chronic kidney disease: Secondary | ICD-10-CM | POA: Diagnosis not present

## 2021-08-30 DIAGNOSIS — K760 Fatty (change of) liver, not elsewhere classified: Secondary | ICD-10-CM | POA: Diagnosis not present

## 2021-08-30 DIAGNOSIS — M1990 Unspecified osteoarthritis, unspecified site: Secondary | ICD-10-CM | POA: Diagnosis not present

## 2021-08-30 DIAGNOSIS — I2510 Atherosclerotic heart disease of native coronary artery without angina pectoris: Secondary | ICD-10-CM | POA: Diagnosis not present

## 2021-08-30 DIAGNOSIS — I442 Atrioventricular block, complete: Secondary | ICD-10-CM | POA: Diagnosis not present

## 2021-08-30 DIAGNOSIS — I480 Paroxysmal atrial fibrillation: Secondary | ICD-10-CM | POA: Diagnosis not present

## 2021-08-30 DIAGNOSIS — D696 Thrombocytopenia, unspecified: Secondary | ICD-10-CM | POA: Diagnosis not present

## 2021-08-30 DIAGNOSIS — G8929 Other chronic pain: Secondary | ICD-10-CM | POA: Diagnosis not present

## 2021-08-30 DIAGNOSIS — K219 Gastro-esophageal reflux disease without esophagitis: Secondary | ICD-10-CM | POA: Diagnosis not present

## 2021-08-30 DIAGNOSIS — G4733 Obstructive sleep apnea (adult) (pediatric): Secondary | ICD-10-CM | POA: Diagnosis not present

## 2021-08-30 DIAGNOSIS — M545 Low back pain, unspecified: Secondary | ICD-10-CM | POA: Diagnosis not present

## 2021-08-30 DIAGNOSIS — E7800 Pure hypercholesterolemia, unspecified: Secondary | ICD-10-CM | POA: Diagnosis not present

## 2021-08-30 DIAGNOSIS — I428 Other cardiomyopathies: Secondary | ICD-10-CM | POA: Diagnosis not present

## 2021-08-30 DIAGNOSIS — M109 Gout, unspecified: Secondary | ICD-10-CM | POA: Diagnosis not present

## 2021-08-30 DIAGNOSIS — D519 Vitamin B12 deficiency anemia, unspecified: Secondary | ICD-10-CM | POA: Diagnosis not present

## 2021-08-30 DIAGNOSIS — K589 Irritable bowel syndrome without diarrhea: Secondary | ICD-10-CM | POA: Diagnosis not present

## 2021-08-30 DIAGNOSIS — B3731 Acute candidiasis of vulva and vagina: Secondary | ICD-10-CM | POA: Diagnosis not present

## 2021-08-30 DIAGNOSIS — I132 Hypertensive heart and chronic kidney disease with heart failure and with stage 5 chronic kidney disease, or end stage renal disease: Secondary | ICD-10-CM | POA: Diagnosis not present

## 2021-08-30 DIAGNOSIS — J45909 Unspecified asthma, uncomplicated: Secondary | ICD-10-CM | POA: Diagnosis not present

## 2021-08-30 DIAGNOSIS — N186 End stage renal disease: Secondary | ICD-10-CM | POA: Diagnosis not present

## 2021-09-02 ENCOUNTER — Ambulatory Visit (INDEPENDENT_AMBULATORY_CARE_PROVIDER_SITE_OTHER): Payer: Medicare Other | Admitting: Cardiovascular Disease

## 2021-09-02 ENCOUNTER — Encounter: Payer: Self-pay | Admitting: Cardiovascular Disease

## 2021-09-02 VITALS — BP 112/64 | HR 69 | Ht 63.0 in | Wt 201.0 lb

## 2021-09-02 DIAGNOSIS — I5043 Acute on chronic combined systolic (congestive) and diastolic (congestive) heart failure: Secondary | ICD-10-CM

## 2021-09-02 DIAGNOSIS — G4733 Obstructive sleep apnea (adult) (pediatric): Secondary | ICD-10-CM

## 2021-09-02 DIAGNOSIS — I10 Essential (primary) hypertension: Secondary | ICD-10-CM

## 2021-09-02 DIAGNOSIS — E2749 Other adrenocortical insufficiency: Secondary | ICD-10-CM

## 2021-09-02 DIAGNOSIS — I25118 Atherosclerotic heart disease of native coronary artery with other forms of angina pectoris: Secondary | ICD-10-CM | POA: Diagnosis not present

## 2021-09-02 DIAGNOSIS — N184 Chronic kidney disease, stage 4 (severe): Secondary | ICD-10-CM

## 2021-09-02 DIAGNOSIS — I442 Atrioventricular block, complete: Secondary | ICD-10-CM | POA: Diagnosis not present

## 2021-09-02 DIAGNOSIS — E782 Mixed hyperlipidemia: Secondary | ICD-10-CM | POA: Diagnosis not present

## 2021-09-02 DIAGNOSIS — E662 Morbid (severe) obesity with alveolar hypoventilation: Secondary | ICD-10-CM

## 2021-09-02 DIAGNOSIS — I25708 Atherosclerosis of coronary artery bypass graft(s), unspecified, with other forms of angina pectoris: Secondary | ICD-10-CM

## 2021-09-02 DIAGNOSIS — Z950 Presence of cardiac pacemaker: Secondary | ICD-10-CM | POA: Diagnosis not present

## 2021-09-02 DIAGNOSIS — E1122 Type 2 diabetes mellitus with diabetic chronic kidney disease: Secondary | ICD-10-CM | POA: Diagnosis not present

## 2021-09-02 NOTE — Patient Instructions (Signed)
Medication Instructions:  No changes *If you need a refill on your cardiac medications before your next appointment, please call your pharmacy*   Lab Work: Your provider would like for you to have the following labs today: Magnesium, BMET, and BNP  If you have labs (blood work) drawn today and your tests are completely normal, you will receive your results only by: [place] (if you have MyChart) OR A paper copy in the mail If you have any lab test that is abnormal or we need to change your treatment, we will call you to review the results.   Testing/Procedures: None ordered   Follow-Up: At [facility], you and your health needs are our priority.  As part of our continuing mission to provide you with exceptional heart care, we have created designated Provider Care Teams.  These Care Teams include your primary Cardiologist (physician) and Advanced Practice Providers (APPs -  Physician Assistants and Nurse Practitioners) who all work together to provide you with the care you need, when you need it.  We recommend signing up for the patient portal called "MyChart".  Sign up information is provided on this After Visit Summary.  MyChart is used to connect with patients for Virtual Visits (Telemedicine).  Patients are able to view lab/test results, encounter notes, upcoming appointments, etc.  Non-urgent messages can be sent to your provider as well.   To learn more about what you can do with MyChart, go to [email or web address].    Your next appointment:   6 week(s) on a device day  The format for your next appointment:   In Person  Provider:   Sanda Klein, MD {   Important Information About Sugar

## 2021-09-02 NOTE — Progress Notes (Signed)
Patient ID: [name], female   DOB: [birth year], 76 y.o.   MRN: [id number] .    Cardiology Office Note    Date:  09/04/2021   ID:  [name], DOB 10/16/1945, MRN [id number]  PCP:  Lujean Amel, MD  Cardiologist:   Sanda Klein, MD   No chief complaint on file.    History of Present Illness:  [name] is a 75 y.o. female with CAD s/p CABG October 2016 (LIMA to LAD, SVG to D1, SVG to OM1 and OM2, SVG to PDA), diastolic heart failure, morbid obesity, adrenal insufficiency, type 2 diabetes mellitus, Hypertension, stage III chronic kidney disease, gout, obstructive sleep apnea, asthma. Nonischemic cardiomyopathy and heart failure preceded the diagnosis of coronary disease. She has complete heart block and had a conventional pacemaker implanted in 2001. This was upgraded to a biventricular ICD implanted in 2008 for severe cardiomyopathy with ejection fraction 20%, downgraded to CRT-P in 2014 ([place]) following excellent response to resynchronization (hyper-responder, normalization of left ventricular systolic function). The current atrial and right ventricular leads were implanted in 2001, the left ventricular lead was implanted in 2008. The defibrillator lead implanted in 2008 is capped and its tip was resected at the time of bypass surgery. Ever since bypass surgery, atrial pacing thresholds have been extremely high, so her device is programmed VDD.   She had a TIA (left hemiparesis and facial droop 09/16/2020)  and received a right internal carotid stent (TCAR) by Dr. Monica Martinez on October 01, 2020. Echo 08/08 showed LVEF 55-60%, E/A was <1, but E/e' was elevated. She underwent  elective pacemaker generator change out on December 31, 2020.  She was hospitalized just a week later with massive volume overload and underwent diuresis of 4.5 L of excess volume.  Discharge weight was 230 pounds.  She was seen in the emergency room on January 4 with cellulitis of the  lower extremity.  In March 2023 she had a prolonged hospitalization due to sepsis, complicated by adrenal insufficiency related circulatory collapse, acute kidney injury leading to need for dialysis and substantial residual chronic kidney disease (now with CKD stage IV, but no longer requiring dialysis).  She required prolonged rehab.  She was again hospitalized with cellulitis of the lower extremity on 08/04/2021 and required substantial volume resuscitation, subsequently treated with diuretics and high doses, discharged on August 15, 2021 with a weight of 222 pounds, but still with marked lower extremity edema.  Has continued on oral diuretics at home and has steadily lost quite a bit of fluid and is now down to 201 pounds, although she still has orthopnea and 3+ pitting edema of the lower extremities all the way to the knees despite using compression stockings.  She complains of a mild sensation of chest pressure constantly and has dysphagia.  Device interrogation shows normal function of her [employer] visionist biventricular pacemaker (known chronic dysfunction of her atrial lead).  She has 98% biventricular pacing.  Estimated generator longevity is 8.5 years.  Due to her markedly worsening renal function we have been unable to diurese aggressively.  Labs checked today show her creatinine has inched up further and is now 4.0.  Most recent electrocardiogram performed 08/10/2021 shows effective biventricular pacing with a remarkably narrow QRS complex and positive R wave in lead V1.  She has previously had rapid deterioration of heart failure when losing left ventricular lead capture.   Had symptoms of heart failure again in the fall of 2020 and echocardiogram  01/18/2019 showed EF had deteriorated, down to 40-45% (had been normal in 2017) and left atrial pressures were increased.  On 12/17 she was seen in the clinic and it was noted that she had lost left ventricular capture.  The output was  increased in the coronary sinus lead.    Right and left heart catheterization with Dr. Haroldine Laws on 03/11/2019.  Coronary anatomy was unchanged, with known occlusion of the SVG to OM but with other bypasses patent and with patent stents in the  native left circumflex coronary artery and ramus intermedius.  Left heart filling pressures were perfectly compensated with a wedge pressure of 14, but there was still evidence of mild pulmonary hypertension (PA 34/17, mean 24 mmHg, PVR 1.5, normal cardiac output with an index of 3.1 L/minute/m sq).   Past Medical History:  Diagnosis Date   AICD (automatic cardioverter/defibrillator) present    downgraded to CRT-P in 2014   Anemia    Arthritis    Asthma    Atrial fibrillation ([place]) 10/24/2015   Questionable history of A Fib/A Flutter. On device interrogation on 9/7, showed 0.70mn of A Fib/A Flutter with 1% burden.  Device check in Jan 2018 showed no sustained AF (runs of less than 30 seconds). No anticoagulation indicated.   CAD (coronary artery disease)    a. 10/09/16 LHC: SVG->LAD patent, SVG->Diag patent, SVG->RCA patent, SVG->LCx occluded. EF 60%, b. 10/31/16 LHC DES to AV groove Circ, DES to intermed branch   CHF (congestive heart failure) (HCC)    Chronic lower back pain    Fatty liver disease, nonalcoholic    GERD (gastroesophageal reflux disease)    Gout    H/O hiatal hernia    High cholesterol    Hyperplastic colon polyp    Hypertension    IBS (irritable bowel syndrome)    Internal hemorrhoids    INTERNAL HEMORRHOIDS WITHOUT MENTION COMP 04/12/2007   Qualifier: Diagnosis of  By: BOlevia PerchesMD, Dora M    [place]ischemic cardiomyopathy ([place] 11/22/2011   Pt responded to BiV ICD- last EF 55-60% Sept 2017   Obesity    OSA (obstructive sleep apnea)    "can't tolerate a mask", wears 2L nocturnal O2   Presence of permanent cardiac pacemaker    11/02/12 [employer] V273 INTUA PPM   [place]   Type II diabetes mellitus ([facility]      Past Surgical History:  Procedure Laterality Date   ABDOMINAL ULTRASOUND  12/01/2011   Peripelvic cysts- #1- 2.4x1.9x2.3cm, #2-1.2x0.9x1.2cm   ANKLE FRACTURE SURGERY Right    "had rod put in"   ANTERIOR CERVICAL DECOMP/DISCECTOMY FUSION     APPENDECTOMY     BACK SURGERY     BIOPSY  12/29/2017   Procedure: BIOPSY;  Surgeon: NMauri Pole MD;  [facility];  Service: Endoscopy;;   BIV ICD INSERTION CRT-D  2001?   BIV PACEMAKER GENERATOR CHANGE OUT N/A 11/02/2012   Procedure: BIV PACEMAKER GENERATOR CHANGE OUT;  Surgeon: MSanda Klein MD;  [facility];  Service: Cardiovascular;  Laterality: N/A;   CARDIAC CATHETERIZATION  05/17/1999   No significant coronary obstructive disease w/ mild 20% luminal irregularity of the first diag branch of the LAD   CARDIAC CATHETERIZATION  07/08/2002   No significant CAD, moderately depressed LV systolic function   CARDIAC CATHETERIZATION Bilateral 04/26/2007   Normal findings, recommend medical therapy   CARDIAC CATHETERIZATION  02/18/2008   Moderate CAD, would benefit from having a functional study, recommend continue medical therapy  CARDIAC CATHETERIZATION  07/23/2012   Medical therapy   CARDIAC CATHETERIZATION N/A 11/24/2014   Procedure: Left Heart Cath and Coronary Angiography;  Surgeon: Troy Sine, MD;  Location: [place] [facility];  Service: Cardiovascular;  Laterality: N/A;   CARDIAC CATHETERIZATION  11/27/2014   Procedure: Intravascular Pressure Wire/FFR Study;  Surgeon: Peter M Martinique, MD;  Location: [place] [facility];  Service: Cardiovascular;;   CARDIAC CATHETERIZATION  10/09/2016   CHOLECYSTECTOMY N/A 04/09/2018   Procedure: LAPAROSCOPIC CHOLECYSTECTOMY;  Surgeon: Greer Pickerel, MD;  [facility];  Service: General;  Laterality: N/A;   COLONOSCOPY WITH PROPOFOL N/A 12/29/2017   Procedure: COLONOSCOPY WITH PROPOFOL;  Surgeon: [name], MD;  [facility];  Service: Endoscopy;  Laterality:  N/A;   CORONARY ANGIOGRAM  2010   CORONARY ARTERY BYPASS GRAFT N/A 11/29/2014   Procedure: CORONARY ARTERY BYPASS GRAFTING x 5 (LIMA-LAD, SVG-D, SVG-OM1-OM2, SVG-PD);  Surgeon: Melrose Nakayama, MD;  Location: [facility];  Service: Open Heart Surgery;  Laterality: N/A;   CORONARY STENT INTERVENTION N/A 10/31/2016   Procedure: CORONARY STENT INTERVENTION;  Surgeon: Burnell Blanks, MD;  Location: [place] [facility];  Service: Cardiovascular;  Laterality: N/A;   ESOPHAGOGASTRODUODENOSCOPY (EGD) WITH PROPOFOL N/A 12/29/2017   Procedure: ESOPHAGOGASTRODUODENOSCOPY (EGD) WITH PROPOFOL;  Surgeon: [name], MD;  [facility];  Service: Endoscopy;  Laterality: N/A;   FRACTURE SURGERY     INSERT / REPLACE / REMOVE PACEMAKER  1999   INTRAMEDULLARY (IM) NAIL INTERTROCHANTERIC Left 04/04/2021   Procedure: INTRAMEDULLARY (IM) NAIL INTERTROCHANTRIC;  Surgeon: Vanetta Mulders, MD;  Location: [place];  Service: Orthopedics;  Laterality: Left;   IR FLUORO GUIDE CV LINE LEFT  04/11/2021   IR US GUIDE VASC ACCESS LEFT  04/11/2021   KNEE ARTHROSCOPY Bilateral    LEFT HEART CATH AND CORS/GRAFTS ANGIOGRAPHY N/A 12/09/2017   Procedure: LEFT HEART CATH AND CORS/GRAFTS ANGIOGRAPHY;  Surgeon: Troy Sine, MD;  Location: [place] [facility];  Service: Cardiovascular;  Laterality: N/A;   LEFT HEART CATHETERIZATION WITH CORONARY ANGIOGRAM N/A 07/23/2012   Procedure: LEFT HEART CATHETERIZATION WITH CORONARY ANGIOGRAM;  Surgeon: Leonie Man, MD;  Location: [facility] LAB;  Service: Cardiovascular;  Laterality: N/A;   LEXISCAN MYOVIEW  11/14/2011   Mild-moderate defect seen in Mid Inferolateral and Mid Anterolateral regions-consistant w/ infarct/scar. No significant ischemia demonstrated.   POLYPECTOMY  12/29/2017   Procedure: POLYPECTOMY;  Surgeon: [name], MD;  Location: [facility];  Service: Endoscopy;;   PPM GENERATOR CHANGEOUT N/A 12/31/2020   Procedure: PPM GENERATOR CHANGEOUT;   Surgeon: Sanda Klein, MD;  Location: [place] [facility];  Service: Cardiovascular;  Laterality: N/A;   RIGHT/LEFT HEART CATH AND CORONARY ANGIOGRAPHY N/A 10/09/2016   Procedure: RIGHT/LEFT HEART CATH AND CORONARY ANGIOGRAPHY;  Surgeon: Jolaine Artist, MD;  Location: [place] [facility] LAB;  Service: Cardiovascular;  Laterality: N/A;   RIGHT/LEFT HEART CATH AND CORONARY/GRAFT ANGIOGRAPHY N/A 03/11/2019   Procedure: RIGHT/LEFT HEART CATH AND CORONARY/GRAFT ANGIOGRAPHY;  Surgeon: Jolaine Artist, MD;  Location: [place] [facility];  Service: Cardiovascular;  Laterality: N/A;   TEE WITHOUT CARDIOVERSION N/A 11/29/2014   Procedure: TRANSESOPHAGEAL ECHOCARDIOGRAM (TEE);  Surgeon: Melrose Nakayama, MD;  Location: [place];  Service: Open Heart Surgery;  Laterality: N/A;   TRANSCAROTID ARTERY REVASCULARIZATION  Right 10/01/2020   Procedure: RIGHT TRANSCAROTID ARTERY REVASCULARIZATION;  Surgeon: [name], MD;  Location: [place];  Service: Vascular;  Laterality: Right;   TRANSTHORACIC ECHOCARDIOGRAM  07/23/2012   EF 55-60%, normal-mild   TUBAL LIGATION     ULTRASOUND GUIDANCE FOR VASCULAR ACCESS Left 10/01/2020   Procedure: ULTRASOUND GUIDANCE FOR VASCULAR ACCESS;  Surgeon: [name], MD;  Location: [place];  Service: Vascular;  Laterality: Left;    Current Medications: Outpatient Medications Prior to Visit  Medication Sig Dispense Refill   ACCU-CHEK AVIVA PLUS test strip Check blood sugar twice daily     Accu-Chek Softclix Lancets lancets Check blood sugar twice daily     acetaminophen (TYLENOL) 500 MG tablet Take 1,000 mg by mouth 3 (three) times daily as needed (for pain or headaches).     albuterol (PROVENTIL HFA;VENTOLIN HFA) 108 (90 Base) MCG/ACT inhaler Inhale 2 puffs into the lungs every 6 (six) hours as needed for wheezing or shortness of breath. 1 Inhaler 0   Alcohol Swabs (B-D SINGLE USE SWABS REGULAR) PADS      allopurinol (ZYLOPRIM) 100 MG tablet Take 1 tablet (100  mg total) by mouth daily. (Patient taking differently: Take 100 mg by mouth every morning.) 90 tablet 3   aspirin EC 81 MG tablet Take 1 tablet (81 mg total) by mouth daily. (Patient taking differently: Take 81 mg by mouth every morning.) 90 tablet 3   budesonide-formoterol (SYMBICORT) 160-4.5 MCG/ACT inhaler Inhale 2 puffs into the lungs 2 (two) times daily.     clopidogrel (PLAVIX) 75 MG tablet Take 1 tablet (75 mg total) by mouth daily. (Patient taking differently: Take 75 mg by mouth every morning.) 90 tablet 3   cycloSPORINE (RESTASIS) 0.05 % ophthalmic emulsion Place 1 drop into both eyes 2 (two) times daily.     furosemide (LASIX) 80 MG tablet Take 2 tablets (160 mg total) by mouth daily. 60 tablet 1   hydrALAZINE (APRESOLINE) 25 MG tablet Take 1 tablet (25 mg total) by mouth every 8 (eight) hours. 90 tablet 1   isosorbide mononitrate (IMDUR) 30 MG 24 hr tablet TAKE 3 TABLETS BY MOUTH  DAILY (Patient taking differently: Take 90 mg by mouth every morning.) 270 tablet 3   metoprolol succinate (TOPROL-XL) 50 MG 24 hr tablet Take 1 tablet (50 mg total) by mouth daily. Take with or immediately following a meal. 30 tablet 1   OXYGEN Inhale 2-3 L/min into the lungs continuous.     oxymetazoline (AFRIN) 0.05 % nasal spray Place 1 spray into both nostrils 2 (two) times daily as needed for congestion.     pantoprazole (PROTONIX) 40 MG tablet Take 1 tablet (40 mg total) by mouth 2 (two) times daily. 180 tablet 3   polyethylene glycol (MIRALAX / GLYCOLAX) 17 g packet Take 17 g by mouth daily as needed (constipation).     Potassium Chloride ER 20 MEQ TBCR Take 20 mEq by mouth daily in the afternoon.     predniSONE (DELTASONE) 10 MG tablet Take 1 tablet (10 mg total) by mouth daily with breakfast. 30 tablet 1   rosuvastatin (CRESTOR) 20 MG tablet Take 1 tablet (20 mg total) by mouth every evening. NEED OV. 90 tablet 3   sucralfate (CARAFATE) 1 g tablet Take 1 tablet (1 g total) by mouth 4 (four) times daily  -  with meals and at bedtime. (Patient taking differently: Take 2 g by mouth 2 (two) times daily.)     traMADol (ULTRAM) 50 MG tablet Take 50 mg by mouth daily as needed (pain).     vitamin B-12 (CYANOCOBALAMIN) 1000 MCG tablet Take 1 tablet (1,000 mcg total) by mouth daily.  30 tablet 1   albuterol (PROVENTIL) (2.5 MG/3ML) 0.083% nebulizer solution Take 3 mLs (2.5 mg total) by nebulization every 6 (six) hours as needed for wheezing or shortness of breath. (Patient not taking: Reported on 09/02/2021) 75 mL 0   docusate sodium (COLACE) 100 MG capsule Take 100 mg by mouth at bedtime. (Patient not taking: Reported on 09/02/2021)     EPINEPHrine 0.3 mg/0.3 mL IJ SOAJ injection Inject 0.3 mg into the muscle once as needed for anaphylaxis (severe allergic reaction). (Patient not taking: Reported on 09/02/2021)     hydrOXYzine (ATARAX) 25 MG tablet Take 25 mg by mouth 2 (two) times daily as needed for itching. (Patient not taking: Reported on 09/02/2021)     Lidocaine 0.5 % AERO Place 1 spray rectally every 6 (six) hours as needed (hemorrhoids). Preparation H rapid relief lidocaine spray (Patient not taking: Reported on 09/02/2021)     linaclotide (LINZESS) 145 MCG CAPS capsule Take 145 mcg by mouth daily before breakfast. (Patient not taking: Reported on 09/02/2021)     nitroGLYCERIN (NITROSTAT) 0.4 MG SL tablet Place 1 tablet (0.4 mg total) under the tongue every 5 (five) minutes as needed for chest pain. (Patient not taking: Reported on 09/02/2021) 25 tablet 3   ondansetron (ZOFRAN) 4 MG tablet Take 4 mg by mouth daily as needed for nausea or vomiting. (Patient not taking: Reported on 09/02/2021)     senna-docusate (SENOKOT-S) 8.6-50 MG tablet Take 1 tablet by mouth 2 (two) times daily. (Patient not taking: Reported on 09/02/2021)     empagliflozin (JARDIANCE) 10 MG TABS tablet Take 10 mg by mouth daily before breakfast. (Patient not taking: Reported on 09/02/2021) 90 tablet 3   No facility-administered medications  prior to visit.     Allergies:   Ivp dye [iodinated contrast media], Shellfish allergy, Sulfa antibiotics, Iodine, Atorvastatin, Benadryl [diphenhydramine], Colchicine, Doxycycline, Uloric [febuxostat], Cephalexin, and Zithromax [azithromycin]   Social History   Socioeconomic History   Marital status: Widowed    Spouse name: Elliannah Wayment- deceased   Number of children: 5   Years of education: Not on file   Highest education level: Not on file  Occupational History   Occupation: STAFF/BUFFET    Employer: [employer]  Tobacco Use   Smoking status: Former    Packs/day: 0.25    Years: 3.00    Total pack years: 0.75    Types: Cigarettes    Quit date: 02/11/1968    Years since quitting: 53.6   Smokeless tobacco: Never  Vaping Use   Vaping Use: Never used  Substance and Sexual Activity   Alcohol use: No   Drug use: No   Sexual activity: Never  Other Topics Concern   Not on file  Social History Narrative   Widowed last year. Spouse Mike Gip Sr (64) died on date 01/13/12 Apr 29, 2022) at his home Was a [place] [employer] [place] [employer]   She lives with her two sons, Thayer Ohm    Other sons Haze Rushing   Social Determinants of Health   Financial Resource Strain: High Risk (12/18/2016)   Overall Financial Resource Strain (CARDIA)    Difficulty of Paying Living Expenses: Very hard  Food Insecurity: No Food Insecurity (01/15/2021)   Hunger Vital Sign    Worried About Running Out of Food in the Last Year: Never true    Ran Out of Food in the Last Year: Never true  Transportation Needs: No Transportation Needs (02/05/2021)  PRAPARE - Hydrologist (Medical): No    Lack of Transportation (Non-Medical): No  Physical Activity: Inactive (12/18/2016)   Exercise Vital Sign    Days of Exercise per Week: 0 days    Minutes of Exercise per Session: 0 min  Stress: Stress Concern Present (12/18/2016)   [place]    Feeling of Stress : Very much  Social Connections: Moderately Integrated (02/05/2021)   Social Connection and Isolation Panel [NHANES]    Frequency of Communication with Friends and Family: Three times a week    Frequency of Social Gatherings with Friends and Family: Three times a week    Attends Religious Services: 1 to 4 times per year    Active Member of Clubs or Organizations: Yes    Attends Archivist Meetings: 1 to 4 times per year    Marital Status: Widowed     Family History:  The patient's family history includes Asthma in her sister; Breast cancer in her mother; Diabetes in her maternal grandmother and mother; Glaucoma in her maternal aunt; Heart disease in her maternal aunt and maternal grandmother; Kidney disease in her maternal grandmother.   ROS:   Please see the history of present illness.    ROS All other systems are reviewed and are negative.   PHYSICAL EXAM:   VS:  BP 112/64 (BP Location: Left Arm, Patient Position: Sitting, Cuff Size: Large)   Pulse 69   Ht '5\' 3"'$  (1.6 m)   Wt 201 lb (91.2 kg) Comment: Patient refused to weigh in office  SpO2 99%   BMI 35.61 kg/m       General: Alert, oriented x3, no distress, severely obese.  Well-healed left subclavian pacemaker site.  Wearing oxygen 2 L by nasal cannula Head: no evidence of trauma, PERRL, EOMI, no exophtalmos or lid lag, no myxedema, no xanthelasma; normal ears, nose and oropharynx Neck: Very challenging to evaluate jugular venous pulsations and no hepatojugular reflux; brisk carotid pulses without delay and no carotid bruits Chest: clear to auscultation, no signs of consolidation by percussion or palpation, normal fremitus, symmetrical and full respiratory excursions no wheezes are heard today Cardiovascular: normal position and quality of the apical impulse, regular rhythm, normal first and second heart sounds, no murmurs, rubs or  gallops Abdomen: Obese, no tenderness or obvious distention, no masses by palpation, no abnormal pulsatility or arterial bruits, normal bowel sounds, no hepatosplenomegaly Extremities: 3+ pitting edema symmetrically to the knees despite wearing compression stockings Neurological: grossly nonfocal Psych: Normal mood and affect   Wt Readings from Last 3 Encounters:  09/02/21 201 lb (91.2 kg)  08/21/21 203 lb (92.1 kg)  08/14/21 222 lb 14.2 oz (101.1 kg)      Studies/Labs Reviewed:   EKG:  EKG is not ordered today.  Personally reviewed the tracing from 08/10/2021 showing biventricular pacing with distinct R wave in lead V1 and relatively narrow QRS complex Recent Labs: 08/27/2021: ALT 11; Hemoglobin 9.4; Platelets 126.0 09/02/2021: BNP 798.8; BUN 85; Creatinine, Ser 4.03; Magnesium 2.1; Potassium 3.9; Sodium 136   Lipid Panel    Component Value Date/Time   CHOL 189 08/08/2021 0412   CHOL 186 01/21/2019 1045   TRIG 97 08/08/2021 0412   HDL 51 08/08/2021 0412   HDL 44 01/21/2019 1045   CHOLHDL 3.7 08/08/2021 0412   VLDL 19 08/08/2021 0412   LDLCALC 119 (H) 08/08/2021 0412   LDLCALC 99 01/21/2019 1045  08/18/2018 Total cholesterol 193, triglycerides 187, HDL 52, LDL 103 Hemoglobin A1c 6.9% 11/25/2018  creatinine 2.13  ASSESSMENT:    1. Coronary artery disease of native artery of native heart with stable angina pectoris ([place])   2. Acute on chronic combined systolic and diastolic CHF (congestive heart failure) ([place])   3. Coronary artery disease involving coronary bypass graft of native heart with other forms of angina pectoris ([place])   4. CHB (complete heart block) (HCC)   5. Status post biventricular pacemaker   6. OSA (obstructive sleep apnea)   7. Obesity hypoventilation syndrome ([place])   8. Severe obesity (BMI 35.0-39.9) with comorbidity ([place])   9. Mixed hypercholesterolemia and hypertriglyceridemia   10. Diabetes mellitus with stage 4 chronic kidney disease GFR 15-29  ([place])   11. CKD (chronic kidney disease) stage 4, GFR 15-29 ml/min (HCC)   12. Essential hypertension       PLAN:  In order of problems listed above:  CHF: She still has clear evidence of hypervolemia, but unable to accelerate diuresis due to her kidney disease.  Creatinine is slightly worse at 4.0, but her potassium has normalized.  Continue the current regimen of a total of 160 mg of furosemide daily.  Recommend earlier follow-up with her nephrologist (currently scheduled for October).  In the past, heart failure exacerbation was often precipitated by loss of biventricular pacing, but currently device function appears to be normal.  Impossible to say what her "dry weight" is at this point since she's lost a large amount of real weight due to sequential serious illnesses this year.  BNP check today was approximately 800 which is a big improvement from her last hospitalization when it was roughly 1500, but not quite back to baseline which is probably around 300.  Unable to treat with RAAS inhibitors due to advanced kidney disease.  On hydralazine/nitrates and beta-blocker. CAD s/p CABG: Currently without complaints that suggest angina pectoris.  She has a constant steady chest tightness that does not appear to worsen with activity.  She is not a candidate for coronary angiography due to her advanced renal disease.  When she had her last cardiac catheterization in January 2021, all major coronary territories have adequate coronary flow via bypasses or native vessels with stents.   On lifelong dual antiplatelet therapy.  Receiving statin.  Continue metoprolol (in the past she seemed to do well on bisoprolol, but currently tolerating the less selective beta-blocker without obvious wheezing).  Also on long-acting nitrates. CHB: Pacemaker dependent. CRT-P: Recent pacemaker generator change.  Normal device function.  Has good atrial sensing although the atrial pacing threshold is extremely high (thankfully  she does not require atrial pacing except extremely infrequently).   The current right atrial lead and right ventricular pacing lead were placed in 2001. The current left ventricular lead was placed in 2008.   OSA/obesity-hypoventilation sd: On chronic oxygen.  Note that an ABG performed on 09/26/2020 showed a normal pH of 7.36 with a PCO2 of 62, consistent with severe chronic hypoventilation.  Intolerant of CPAP; pulmonary hypertension due to sleep apnea and obesity hypoventilation syndrome are likely contributing to a large degree to her shortness of breath.  She also has significant reactive airway disease and has periodically required steroid therapy. Morbid obesity: Clearly has lost a lot of real weight and is no longer in the morbid obesity range.  Still trying to assess her "new dry weight". HLP: Recent lipid profile showed LDL cholesterol 119.  She is on rosuvastatin.  We will add ezetimibe 10 mg once daily. DM: Well-controlled. CKD 4-5: Had acute kidney injury with prolonged anuric renal failure requiring dialysis in the spring, clearly has residual kidney damage although she no longer requires dialysis. HTN: controlled. Iatrogenic adrenal insufficiency: For several years now she has received multiple courses of steroids in  high doses for a variety of disorders including COPD exacerbation and gout attacks and other joint related issues.  It is highly likely that she is still adrenally suppressed.  This probably explains her prolonged hypotension unresponsive to pressors that led to renal failure in March of this year.  She last received steroids for gout exacerbation during the hospitalization in late June.  Episodes of unexplained hypotension that did not respond to volume resuscitation should be assumed to represent adrenal crisis and she should receive stress dose hydrocortisone.  Etiology of her dyspnea is multifactorial, including morbid obesity and restrictive physiology, asthma, pulmonary  hypertension due to obesity and obstructive sleep apnea as well as heart failure    Medication Adjustments/Labs and Tests Ordered: Current medicines are reviewed at length with the patient today.  Concerns regarding medicines are outlined above.  Medication changes, Labs and Tests ordered today are listed in the Patient Instructions below. Patient Instructions  Medication Instructions:  No changes *If you need a refill on your cardiac medications before your next appointment, please call your pharmacy*   Lab Work: Your provider would like for you to have the following labs today: Magnesium, BMET, and BNP  If you have labs (blood work) drawn today and your tests are completely normal, you will receive your results only by: [place] (if you have MyChart) OR A paper copy in the mail If you have any lab test that is abnormal or we need to change your treatment, we will call you to review the results.   Testing/Procedures: None ordered   Follow-Up: At [facility], you and your health needs are our priority.  As part of our continuing mission to provide you with exceptional heart care, we have created designated Provider Care Teams.  These Care Teams include your primary Cardiologist (physician) and Advanced Practice Providers (APPs -  Physician Assistants and Nurse Practitioners) who all work together to provide you with the care you need, when you need it.  We recommend signing up for the patient portal called "MyChart".  Sign up information is provided on this After Visit Summary.  MyChart is used to connect with patients for Virtual Visits (Telemedicine).  Patients are able to view lab/test results, encounter notes, upcoming appointments, etc.  Non-urgent messages can be sent to your provider as well.   To learn more about what you can do with MyChart, go to [email or web address].    Your next appointment:   6 week(s) on a device day  The format for your next appointment:    In Person  Provider:   Sanda Klein, MD {   Important Information About Sugar         Signed, Sanda Klein, MD  09/04/2021 4:17 PM    [address], [place], [place]  [address] Phone: [phone]; Fax: [phone]

## 2021-09-03 DIAGNOSIS — B3731 Acute candidiasis of vulva and vagina: Secondary | ICD-10-CM | POA: Diagnosis not present

## 2021-09-03 DIAGNOSIS — G4733 Obstructive sleep apnea (adult) (pediatric): Secondary | ICD-10-CM | POA: Diagnosis not present

## 2021-09-03 DIAGNOSIS — K219 Gastro-esophageal reflux disease without esophagitis: Secondary | ICD-10-CM | POA: Diagnosis not present

## 2021-09-03 DIAGNOSIS — I132 Hypertensive heart and chronic kidney disease with heart failure and with stage 5 chronic kidney disease, or end stage renal disease: Secondary | ICD-10-CM | POA: Diagnosis not present

## 2021-09-03 DIAGNOSIS — K589 Irritable bowel syndrome without diarrhea: Secondary | ICD-10-CM | POA: Diagnosis not present

## 2021-09-03 DIAGNOSIS — J45909 Unspecified asthma, uncomplicated: Secondary | ICD-10-CM | POA: Diagnosis not present

## 2021-09-03 DIAGNOSIS — E1122 Type 2 diabetes mellitus with diabetic chronic kidney disease: Secondary | ICD-10-CM | POA: Diagnosis not present

## 2021-09-03 DIAGNOSIS — I959 Hypotension, unspecified: Secondary | ICD-10-CM | POA: Diagnosis not present

## 2021-09-03 DIAGNOSIS — M545 Low back pain, unspecified: Secondary | ICD-10-CM | POA: Diagnosis not present

## 2021-09-03 DIAGNOSIS — G8929 Other chronic pain: Secondary | ICD-10-CM | POA: Diagnosis not present

## 2021-09-03 DIAGNOSIS — I2510 Atherosclerotic heart disease of native coronary artery without angina pectoris: Secondary | ICD-10-CM | POA: Diagnosis not present

## 2021-09-03 DIAGNOSIS — I5043 Acute on chronic combined systolic (congestive) and diastolic (congestive) heart failure: Secondary | ICD-10-CM | POA: Diagnosis not present

## 2021-09-03 DIAGNOSIS — K760 Fatty (change of) liver, not elsewhere classified: Secondary | ICD-10-CM | POA: Diagnosis not present

## 2021-09-03 DIAGNOSIS — I428 Other cardiomyopathies: Secondary | ICD-10-CM | POA: Diagnosis not present

## 2021-09-03 DIAGNOSIS — J9621 Acute and chronic respiratory failure with hypoxia: Secondary | ICD-10-CM | POA: Diagnosis not present

## 2021-09-03 DIAGNOSIS — M1990 Unspecified osteoarthritis, unspecified site: Secondary | ICD-10-CM | POA: Diagnosis not present

## 2021-09-03 DIAGNOSIS — E7800 Pure hypercholesterolemia, unspecified: Secondary | ICD-10-CM | POA: Diagnosis not present

## 2021-09-03 DIAGNOSIS — D519 Vitamin B12 deficiency anemia, unspecified: Secondary | ICD-10-CM | POA: Diagnosis not present

## 2021-09-03 DIAGNOSIS — N186 End stage renal disease: Secondary | ICD-10-CM | POA: Diagnosis not present

## 2021-09-03 DIAGNOSIS — I480 Paroxysmal atrial fibrillation: Secondary | ICD-10-CM | POA: Diagnosis not present

## 2021-09-03 DIAGNOSIS — I442 Atrioventricular block, complete: Secondary | ICD-10-CM | POA: Diagnosis not present

## 2021-09-03 DIAGNOSIS — M109 Gout, unspecified: Secondary | ICD-10-CM | POA: Diagnosis not present

## 2021-09-03 DIAGNOSIS — D696 Thrombocytopenia, unspecified: Secondary | ICD-10-CM | POA: Diagnosis not present

## 2021-09-03 LAB — BASIC METABOLIC PANEL
BUN/Creatinine Ratio: 21 (ref 12–28)
BUN: 85 mg/dL (ref 8–27)
CO2: 30 mmol/L — ABNORMAL HIGH (ref 20–29)
Calcium: 9.3 mg/dL (ref 8.7–10.3)
Chloride: 88 mmol/L — ABNORMAL LOW (ref 96–106)
Creatinine, Ser: 4.03 mg/dL — ABNORMAL HIGH (ref 0.57–1.00)
Glucose: 135 mg/dL — ABNORMAL HIGH (ref 70–99)
Potassium: 3.9 mmol/L (ref 3.5–5.2)
Sodium: 136 mmol/L (ref 134–144)
eGFR: 11 mL/min/{1.73_m2} — ABNORMAL LOW (ref >59)

## 2021-09-03 LAB — MAGNESIUM: Magnesium: 2.1 mg/dL (ref 1.6–2.3)

## 2021-09-03 LAB — BRAIN NATRIURETIC PEPTIDE: BNP: 798.8 pg/mL — ABNORMAL HIGH (ref 0.0–100.0)

## 2021-09-05 ENCOUNTER — Encounter: Payer: Self-pay | Admitting: Physician Assistant

## 2021-09-05 DIAGNOSIS — J9611 Chronic respiratory failure with hypoxia: Secondary | ICD-10-CM | POA: Diagnosis not present

## 2021-09-05 DIAGNOSIS — J9612 Chronic respiratory failure with hypercapnia: Secondary | ICD-10-CM | POA: Diagnosis not present

## 2021-09-05 DIAGNOSIS — I5022 Chronic systolic (congestive) heart failure: Secondary | ICD-10-CM | POA: Diagnosis not present

## 2021-09-05 DIAGNOSIS — N1832 Chronic kidney disease, stage 3b: Secondary | ICD-10-CM | POA: Diagnosis not present

## 2021-09-05 DIAGNOSIS — J9621 Acute and chronic respiratory failure with hypoxia: Secondary | ICD-10-CM | POA: Diagnosis not present

## 2021-09-05 DIAGNOSIS — I120 Hypertensive chronic kidney disease with stage 5 chronic kidney disease or end stage renal disease: Secondary | ICD-10-CM | POA: Diagnosis not present

## 2021-09-05 DIAGNOSIS — D631 Anemia in chronic kidney disease: Secondary | ICD-10-CM | POA: Diagnosis not present

## 2021-09-05 DIAGNOSIS — N185 Chronic kidney disease, stage 5: Secondary | ICD-10-CM | POA: Diagnosis not present

## 2021-09-05 DIAGNOSIS — M6281 Muscle weakness (generalized): Secondary | ICD-10-CM | POA: Diagnosis not present

## 2021-09-05 DIAGNOSIS — N2581 Secondary hyperparathyroidism of renal origin: Secondary | ICD-10-CM | POA: Diagnosis not present

## 2021-09-05 DIAGNOSIS — M19072 Primary osteoarthritis, left ankle and foot: Secondary | ICD-10-CM | POA: Diagnosis not present

## 2021-09-05 DIAGNOSIS — I509 Heart failure, unspecified: Secondary | ICD-10-CM | POA: Diagnosis not present

## 2021-09-06 ENCOUNTER — Telehealth: Payer: Self-pay | Admitting: Cardiovascular Disease

## 2021-09-06 DIAGNOSIS — I132 Hypertensive heart and chronic kidney disease with heart failure and with stage 5 chronic kidney disease, or end stage renal disease: Secondary | ICD-10-CM | POA: Diagnosis not present

## 2021-09-06 DIAGNOSIS — I959 Hypotension, unspecified: Secondary | ICD-10-CM | POA: Diagnosis not present

## 2021-09-06 DIAGNOSIS — D696 Thrombocytopenia, unspecified: Secondary | ICD-10-CM | POA: Diagnosis not present

## 2021-09-06 DIAGNOSIS — B3731 Acute candidiasis of vulva and vagina: Secondary | ICD-10-CM | POA: Diagnosis not present

## 2021-09-06 DIAGNOSIS — E7800 Pure hypercholesterolemia, unspecified: Secondary | ICD-10-CM | POA: Diagnosis not present

## 2021-09-06 DIAGNOSIS — I428 Other cardiomyopathies: Secondary | ICD-10-CM | POA: Diagnosis not present

## 2021-09-06 DIAGNOSIS — K760 Fatty (change of) liver, not elsewhere classified: Secondary | ICD-10-CM | POA: Diagnosis not present

## 2021-09-06 DIAGNOSIS — K589 Irritable bowel syndrome without diarrhea: Secondary | ICD-10-CM | POA: Diagnosis not present

## 2021-09-06 DIAGNOSIS — D519 Vitamin B12 deficiency anemia, unspecified: Secondary | ICD-10-CM | POA: Diagnosis not present

## 2021-09-06 DIAGNOSIS — I2510 Atherosclerotic heart disease of native coronary artery without angina pectoris: Secondary | ICD-10-CM | POA: Diagnosis not present

## 2021-09-06 DIAGNOSIS — I480 Paroxysmal atrial fibrillation: Secondary | ICD-10-CM | POA: Diagnosis not present

## 2021-09-06 DIAGNOSIS — M109 Gout, unspecified: Secondary | ICD-10-CM | POA: Diagnosis not present

## 2021-09-06 DIAGNOSIS — M545 Low back pain, unspecified: Secondary | ICD-10-CM | POA: Diagnosis not present

## 2021-09-06 DIAGNOSIS — I442 Atrioventricular block, complete: Secondary | ICD-10-CM | POA: Diagnosis not present

## 2021-09-06 DIAGNOSIS — G8929 Other chronic pain: Secondary | ICD-10-CM | POA: Diagnosis not present

## 2021-09-06 DIAGNOSIS — M1990 Unspecified osteoarthritis, unspecified site: Secondary | ICD-10-CM | POA: Diagnosis not present

## 2021-09-06 DIAGNOSIS — N186 End stage renal disease: Secondary | ICD-10-CM | POA: Diagnosis not present

## 2021-09-06 DIAGNOSIS — K219 Gastro-esophageal reflux disease without esophagitis: Secondary | ICD-10-CM | POA: Diagnosis not present

## 2021-09-06 DIAGNOSIS — E1122 Type 2 diabetes mellitus with diabetic chronic kidney disease: Secondary | ICD-10-CM | POA: Diagnosis not present

## 2021-09-06 DIAGNOSIS — I5043 Acute on chronic combined systolic (congestive) and diastolic (congestive) heart failure: Secondary | ICD-10-CM | POA: Diagnosis not present

## 2021-09-06 DIAGNOSIS — J45909 Unspecified asthma, uncomplicated: Secondary | ICD-10-CM | POA: Diagnosis not present

## 2021-09-06 DIAGNOSIS — J9621 Acute and chronic respiratory failure with hypoxia: Secondary | ICD-10-CM | POA: Diagnosis not present

## 2021-09-06 DIAGNOSIS — G4733 Obstructive sleep apnea (adult) (pediatric): Secondary | ICD-10-CM | POA: Diagnosis not present

## 2021-09-06 NOTE — Telephone Encounter (Signed)
New Message:    Please have [name] to call her. She said she was supposed to be checking on some support hoses for her ,

## 2021-09-06 NOTE — Telephone Encounter (Signed)
Called the patient back. She has been made aware that [employer] in [place] will mail her thigh high compression stockings. She needs to call them and set this up. She has been given the number.

## 2021-09-06 NOTE — Telephone Encounter (Signed)
Message sent to Dr.Croitoru's RN.

## 2021-09-09 ENCOUNTER — Emergency Department (HOSPITAL_COMMUNITY): Payer: Medicare Other

## 2021-09-09 ENCOUNTER — Encounter (HOSPITAL_COMMUNITY): Payer: Self-pay

## 2021-09-09 ENCOUNTER — Other Ambulatory Visit: Payer: Self-pay

## 2021-09-09 ENCOUNTER — Inpatient Hospital Stay (HOSPITAL_COMMUNITY)
Admission: EM | Admit: 2021-09-09 | Discharge: 2021-09-17 | DRG: 516 | Disposition: A | Discharge status: Home Health | Payer: Medicare Other | Attending: Family Medicine | Admitting: Family Medicine

## 2021-09-09 DIAGNOSIS — J9621 Acute and chronic respiratory failure with hypoxia: Secondary | ICD-10-CM | POA: Diagnosis not present

## 2021-09-09 DIAGNOSIS — Z888 Allergy status to other drugs, medicaments and biological substances status: Secondary | ICD-10-CM

## 2021-09-09 DIAGNOSIS — E2749 Other adrenocortical insufficiency: Secondary | ICD-10-CM

## 2021-09-09 DIAGNOSIS — I428 Other cardiomyopathies: Secondary | ICD-10-CM | POA: Diagnosis not present

## 2021-09-09 DIAGNOSIS — Z91041 Radiographic dye allergy status: Secondary | ICD-10-CM

## 2021-09-09 DIAGNOSIS — Z20822 Contact with and (suspected) exposure to covid-19: Secondary | ICD-10-CM | POA: Diagnosis not present

## 2021-09-09 DIAGNOSIS — I4891 Unspecified atrial fibrillation: Secondary | ICD-10-CM | POA: Diagnosis not present

## 2021-09-09 DIAGNOSIS — Z79899 Other long term (current) drug therapy: Secondary | ICD-10-CM

## 2021-09-09 DIAGNOSIS — Z981 Arthrodesis status: Secondary | ICD-10-CM

## 2021-09-09 DIAGNOSIS — W19XXXA Unspecified fall, initial encounter: Secondary | ICD-10-CM | POA: Diagnosis not present

## 2021-09-09 DIAGNOSIS — Z6835 Body mass index (BMI) 35.0-35.9, adult: Secondary | ICD-10-CM

## 2021-09-09 DIAGNOSIS — M4807 Spinal stenosis, lumbosacral region: Secondary | ICD-10-CM | POA: Diagnosis not present

## 2021-09-09 DIAGNOSIS — R918 Other nonspecific abnormal finding of lung field: Secondary | ICD-10-CM | POA: Diagnosis not present

## 2021-09-09 DIAGNOSIS — K760 Fatty (change of) liver, not elsewhere classified: Secondary | ICD-10-CM | POA: Diagnosis not present

## 2021-09-09 DIAGNOSIS — Z950 Presence of cardiac pacemaker: Secondary | ICD-10-CM | POA: Diagnosis not present

## 2021-09-09 DIAGNOSIS — S22009A Unspecified fracture of unspecified thoracic vertebra, initial encounter for closed fracture: Secondary | ICD-10-CM | POA: Diagnosis not present

## 2021-09-09 DIAGNOSIS — G4733 Obstructive sleep apnea (adult) (pediatric): Secondary | ICD-10-CM | POA: Diagnosis not present

## 2021-09-09 DIAGNOSIS — I480 Paroxysmal atrial fibrillation: Secondary | ICD-10-CM | POA: Diagnosis not present

## 2021-09-09 DIAGNOSIS — N186 End stage renal disease: Secondary | ICD-10-CM | POA: Diagnosis not present

## 2021-09-09 DIAGNOSIS — M48061 Spinal stenosis, lumbar region without neurogenic claudication: Secondary | ICD-10-CM | POA: Diagnosis not present

## 2021-09-09 DIAGNOSIS — M4814 Ankylosing hyperostosis [Forestier], thoracic region: Secondary | ICD-10-CM | POA: Diagnosis not present

## 2021-09-09 DIAGNOSIS — Z881 Allergy status to other antibiotic agents status: Secondary | ICD-10-CM

## 2021-09-09 DIAGNOSIS — M546 Pain in thoracic spine: Secondary | ICD-10-CM | POA: Diagnosis not present

## 2021-09-09 DIAGNOSIS — I10 Essential (primary) hypertension: Secondary | ICD-10-CM | POA: Diagnosis present

## 2021-09-09 DIAGNOSIS — E1122 Type 2 diabetes mellitus with diabetic chronic kidney disease: Secondary | ICD-10-CM | POA: Diagnosis not present

## 2021-09-09 DIAGNOSIS — N184 Chronic kidney disease, stage 4 (severe): Secondary | ICD-10-CM | POA: Diagnosis not present

## 2021-09-09 DIAGNOSIS — J9611 Chronic respiratory failure with hypoxia: Secondary | ICD-10-CM

## 2021-09-09 DIAGNOSIS — D519 Vitamin B12 deficiency anemia, unspecified: Secondary | ICD-10-CM | POA: Diagnosis not present

## 2021-09-09 DIAGNOSIS — J449 Chronic obstructive pulmonary disease, unspecified: Secondary | ICD-10-CM | POA: Diagnosis not present

## 2021-09-09 DIAGNOSIS — Z8249 Family history of ischemic heart disease and other diseases of the circulatory system: Secondary | ICD-10-CM

## 2021-09-09 DIAGNOSIS — J45909 Unspecified asthma, uncomplicated: Secondary | ICD-10-CM | POA: Diagnosis not present

## 2021-09-09 DIAGNOSIS — I700 Atherosclerosis of aorta: Secondary | ICD-10-CM | POA: Diagnosis not present

## 2021-09-09 DIAGNOSIS — Z01810 Encounter for preprocedural cardiovascular examination: Secondary | ICD-10-CM | POA: Diagnosis not present

## 2021-09-09 DIAGNOSIS — K5900 Constipation, unspecified: Secondary | ICD-10-CM | POA: Diagnosis not present

## 2021-09-09 DIAGNOSIS — S0990XA Unspecified injury of head, initial encounter: Secondary | ICD-10-CM | POA: Diagnosis not present

## 2021-09-09 DIAGNOSIS — E273 Drug-induced adrenocortical insufficiency: Secondary | ICD-10-CM | POA: Diagnosis present

## 2021-09-09 DIAGNOSIS — G8929 Other chronic pain: Secondary | ICD-10-CM | POA: Diagnosis not present

## 2021-09-09 DIAGNOSIS — M1612 Unilateral primary osteoarthritis, left hip: Secondary | ICD-10-CM | POA: Diagnosis not present

## 2021-09-09 DIAGNOSIS — Y92009 Unspecified place in unspecified non-institutional (private) residence as the place of occurrence of the external cause: Secondary | ICD-10-CM

## 2021-09-09 DIAGNOSIS — Z743 Need for continuous supervision: Secondary | ICD-10-CM | POA: Diagnosis not present

## 2021-09-09 DIAGNOSIS — Z87891 Personal history of nicotine dependence: Secondary | ICD-10-CM

## 2021-09-09 DIAGNOSIS — M25552 Pain in left hip: Secondary | ICD-10-CM | POA: Diagnosis not present

## 2021-09-09 DIAGNOSIS — I132 Hypertensive heart and chronic kidney disease with heart failure and with stage 5 chronic kidney disease, or end stage renal disease: Secondary | ICD-10-CM | POA: Diagnosis not present

## 2021-09-09 DIAGNOSIS — J9811 Atelectasis: Secondary | ICD-10-CM | POA: Diagnosis not present

## 2021-09-09 DIAGNOSIS — N179 Acute kidney failure, unspecified: Secondary | ICD-10-CM | POA: Diagnosis not present

## 2021-09-09 DIAGNOSIS — Z7952 Long term (current) use of systemic steroids: Secondary | ICD-10-CM | POA: Diagnosis not present

## 2021-09-09 DIAGNOSIS — Z7902 Long term (current) use of antithrombotics/antiplatelets: Secondary | ICD-10-CM

## 2021-09-09 DIAGNOSIS — S79912A Unspecified injury of left hip, initial encounter: Secondary | ICD-10-CM | POA: Diagnosis not present

## 2021-09-09 DIAGNOSIS — M109 Gout, unspecified: Secondary | ICD-10-CM | POA: Diagnosis present

## 2021-09-09 DIAGNOSIS — B3731 Acute candidiasis of vulva and vagina: Secondary | ICD-10-CM | POA: Diagnosis not present

## 2021-09-09 DIAGNOSIS — E876 Hypokalemia: Secondary | ICD-10-CM | POA: Diagnosis not present

## 2021-09-09 DIAGNOSIS — M160 Bilateral primary osteoarthritis of hip: Secondary | ICD-10-CM | POA: Diagnosis not present

## 2021-09-09 DIAGNOSIS — Z882 Allergy status to sulfonamides status: Secondary | ICD-10-CM

## 2021-09-09 DIAGNOSIS — Z8781 Personal history of (healed) traumatic fracture: Secondary | ICD-10-CM

## 2021-09-09 DIAGNOSIS — Z951 Presence of aortocoronary bypass graft: Secondary | ICD-10-CM | POA: Diagnosis not present

## 2021-09-09 DIAGNOSIS — I2510 Atherosclerotic heart disease of native coronary artery without angina pectoris: Secondary | ICD-10-CM | POA: Diagnosis present

## 2021-09-09 DIAGNOSIS — Z7951 Long term (current) use of inhaled steroids: Secondary | ICD-10-CM

## 2021-09-09 DIAGNOSIS — W07XXXA Fall from chair, initial encounter: Secondary | ICD-10-CM | POA: Diagnosis present

## 2021-09-09 DIAGNOSIS — I442 Atrioventricular block, complete: Secondary | ICD-10-CM | POA: Diagnosis not present

## 2021-09-09 DIAGNOSIS — K219 Gastro-esophageal reflux disease without esophagitis: Secondary | ICD-10-CM | POA: Diagnosis not present

## 2021-09-09 DIAGNOSIS — Z95810 Presence of automatic (implantable) cardiac defibrillator: Secondary | ICD-10-CM

## 2021-09-09 DIAGNOSIS — J3489 Other specified disorders of nose and nasal sinuses: Secondary | ICD-10-CM | POA: Diagnosis not present

## 2021-09-09 DIAGNOSIS — Z91013 Allergy to seafood: Secondary | ICD-10-CM | POA: Diagnosis not present

## 2021-09-09 DIAGNOSIS — M545 Low back pain, unspecified: Secondary | ICD-10-CM | POA: Diagnosis not present

## 2021-09-09 DIAGNOSIS — R9431 Abnormal electrocardiogram [ECG] [EKG]: Secondary | ICD-10-CM | POA: Diagnosis not present

## 2021-09-09 DIAGNOSIS — T380X5A Adverse effect of glucocorticoids and synthetic analogues, initial encounter: Secondary | ICD-10-CM | POA: Diagnosis not present

## 2021-09-09 DIAGNOSIS — S22069A Unspecified fracture of T7-T8 vertebra, initial encounter for closed fracture: Secondary | ICD-10-CM | POA: Diagnosis present

## 2021-09-09 DIAGNOSIS — I959 Hypotension, unspecified: Secondary | ICD-10-CM | POA: Diagnosis not present

## 2021-09-09 DIAGNOSIS — R519 Headache, unspecified: Secondary | ICD-10-CM | POA: Diagnosis not present

## 2021-09-09 DIAGNOSIS — S22079A Unspecified fracture of T9-T10 vertebra, initial encounter for closed fracture: Secondary | ICD-10-CM | POA: Diagnosis not present

## 2021-09-09 DIAGNOSIS — S3991XA Unspecified injury of abdomen, initial encounter: Secondary | ICD-10-CM | POA: Diagnosis not present

## 2021-09-09 DIAGNOSIS — Z825 Family history of asthma and other chronic lower respiratory diseases: Secondary | ICD-10-CM

## 2021-09-09 DIAGNOSIS — R109 Unspecified abdominal pain: Secondary | ICD-10-CM | POA: Diagnosis not present

## 2021-09-09 DIAGNOSIS — E662 Morbid (severe) obesity with alveolar hypoventilation: Secondary | ICD-10-CM | POA: Diagnosis present

## 2021-09-09 DIAGNOSIS — S199XXA Unspecified injury of neck, initial encounter: Secondary | ICD-10-CM | POA: Diagnosis not present

## 2021-09-09 DIAGNOSIS — Z043 Encounter for examination and observation following other accident: Secondary | ICD-10-CM | POA: Diagnosis not present

## 2021-09-09 DIAGNOSIS — I5032 Chronic diastolic (congestive) heart failure: Secondary | ICD-10-CM | POA: Diagnosis present

## 2021-09-09 DIAGNOSIS — E7800 Pure hypercholesterolemia, unspecified: Secondary | ICD-10-CM | POA: Diagnosis present

## 2021-09-09 DIAGNOSIS — D649 Anemia, unspecified: Secondary | ICD-10-CM | POA: Diagnosis not present

## 2021-09-09 DIAGNOSIS — I130 Hypertensive heart and chronic kidney disease with heart failure and stage 1 through stage 4 chronic kidney disease, or unspecified chronic kidney disease: Secondary | ICD-10-CM | POA: Diagnosis present

## 2021-09-09 DIAGNOSIS — Z833 Family history of diabetes mellitus: Secondary | ICD-10-CM

## 2021-09-09 DIAGNOSIS — N250 Renal osteodystrophy: Secondary | ICD-10-CM | POA: Diagnosis not present

## 2021-09-09 DIAGNOSIS — D62 Acute posthemorrhagic anemia: Secondary | ICD-10-CM | POA: Diagnosis not present

## 2021-09-09 DIAGNOSIS — Z9989 Dependence on other enabling machines and devices: Secondary | ICD-10-CM | POA: Diagnosis not present

## 2021-09-09 DIAGNOSIS — Z8673 Personal history of transient ischemic attack (TIA), and cerebral infarction without residual deficits: Secondary | ICD-10-CM

## 2021-09-09 DIAGNOSIS — M4322 Fusion of spine, cervical region: Secondary | ICD-10-CM | POA: Diagnosis not present

## 2021-09-09 DIAGNOSIS — Z7982 Long term (current) use of aspirin: Secondary | ICD-10-CM

## 2021-09-09 DIAGNOSIS — I5030 Unspecified diastolic (congestive) heart failure: Secondary | ICD-10-CM | POA: Diagnosis not present

## 2021-09-09 DIAGNOSIS — S72142A Displaced intertrochanteric fracture of left femur, initial encounter for closed fracture: Secondary | ICD-10-CM | POA: Diagnosis not present

## 2021-09-09 DIAGNOSIS — K589 Irritable bowel syndrome without diarrhea: Secondary | ICD-10-CM | POA: Diagnosis not present

## 2021-09-09 DIAGNOSIS — Z9981 Dependence on supplemental oxygen: Secondary | ICD-10-CM

## 2021-09-09 DIAGNOSIS — D696 Thrombocytopenia, unspecified: Secondary | ICD-10-CM | POA: Diagnosis not present

## 2021-09-09 DIAGNOSIS — M1990 Unspecified osteoarthritis, unspecified site: Secondary | ICD-10-CM | POA: Diagnosis not present

## 2021-09-09 DIAGNOSIS — N189 Chronic kidney disease, unspecified: Secondary | ICD-10-CM

## 2021-09-09 DIAGNOSIS — D631 Anemia in chronic kidney disease: Secondary | ICD-10-CM | POA: Diagnosis present

## 2021-09-09 DIAGNOSIS — I5043 Acute on chronic combined systolic (congestive) and diastolic (congestive) heart failure: Secondary | ICD-10-CM | POA: Diagnosis not present

## 2021-09-09 LAB — COMPREHENSIVE METABOLIC PANEL
ALT: 16 U/L (ref 0–44)
AST: 20 U/L (ref 15–41)
Albumin: 3.6 g/dL (ref 3.5–5.0)
Alkaline Phosphatase: 93 U/L (ref 38–126)
Anion gap: 14 (ref 5–15)
BUN: 118 mg/dL — ABNORMAL HIGH (ref 8–23)
CO2: 29 mmol/L (ref 22–32)
Calcium: 9.3 mg/dL (ref 8.9–10.3)
Chloride: 91 mmol/L — ABNORMAL LOW (ref 98–111)
Creatinine, Ser: 4.75 mg/dL — ABNORMAL HIGH (ref 0.44–1.00)
GFR, Estimated: 9 mL/min — ABNORMAL LOW (ref >60)
Glucose, Bld: 179 mg/dL — ABNORMAL HIGH (ref 70–99)
Potassium: 3.9 mmol/L (ref 3.5–5.1)
Sodium: 134 mmol/L — ABNORMAL LOW (ref 135–145)
Total Bilirubin: 0.6 mg/dL (ref 0.3–1.2)
Total Protein: 6.5 g/dL (ref 6.5–8.1)

## 2021-09-09 LAB — CBC
HCT: 26.7 % — ABNORMAL LOW (ref 36.0–46.0)
Hemoglobin: 8.5 g/dL — ABNORMAL LOW (ref 12.0–15.0)
MCH: 30.9 pg (ref 26.0–34.0)
MCHC: 31.8 g/dL (ref 30.0–36.0)
MCV: 97.1 fL (ref 80.0–100.0)
Platelets: 177 10*3/uL (ref 150–400)
RBC: 2.75 MIL/uL — ABNORMAL LOW (ref 3.87–5.11)
RDW: 12.9 % (ref 11.5–15.5)
WBC: 6.5 10*3/uL (ref 4.0–10.5)
nRBC: 0 % (ref 0.0–0.2)

## 2021-09-09 LAB — URINALYSIS, ROUTINE W REFLEX MICROSCOPIC
Bilirubin Urine: NEGATIVE
Glucose, UA: NEGATIVE mg/dL
Hgb urine dipstick: NEGATIVE
Ketones, ur: NEGATIVE mg/dL
Leukocytes,Ua: NEGATIVE
Nitrite: NEGATIVE
Protein, ur: NEGATIVE mg/dL
Specific Gravity, Urine: 1.006 (ref 1.005–1.030)
pH: 6 (ref 5.0–8.0)

## 2021-09-09 LAB — BRAIN NATRIURETIC PEPTIDE: B Natriuretic Peptide: 569.7 pg/mL — ABNORMAL HIGH (ref 0.0–100.0)

## 2021-09-09 LAB — I-STAT CHEM 8, ED
BUN: >130 mg/dL — ABNORMAL HIGH (ref 8–23)
Calcium, Ion: 1.09 mmol/L — ABNORMAL LOW (ref 1.15–1.40)
Chloride: 92 mmol/L — ABNORMAL LOW (ref 98–111)
Creatinine, Ser: 4.9 mg/dL — ABNORMAL HIGH (ref 0.44–1.00)
Glucose, Bld: 176 mg/dL — ABNORMAL HIGH (ref 70–99)
HCT: 27 % — ABNORMAL LOW (ref 36.0–46.0)
Hemoglobin: 9.2 g/dL — ABNORMAL LOW (ref 12.0–15.0)
Potassium: 3.9 mmol/L (ref 3.5–5.1)
Sodium: 132 mmol/L — ABNORMAL LOW (ref 135–145)
TCO2: 30 mmol/L (ref 22–32)

## 2021-09-09 LAB — RESP PANEL BY RT-PCR (FLU A&B, COVID) ARPGX2
Influenza A by PCR: NEGATIVE
Influenza B by PCR: NEGATIVE
SARS Coronavirus 2 by RT PCR: NEGATIVE

## 2021-09-09 LAB — ETHANOL: Alcohol, Ethyl (B): <10 mg/dL (ref <10)

## 2021-09-09 LAB — LACTIC ACID, PLASMA: Lactic Acid, Venous: 1.8 mmol/L (ref 0.5–1.9)

## 2021-09-09 LAB — SAMPLE TO BLOOD BANK

## 2021-09-09 LAB — PROTIME-INR
INR: 1 (ref 0.8–1.2)
Prothrombin Time: 13.1 seconds (ref 11.4–15.2)

## 2021-09-09 MED ORDER — HYDRALAZINE HCL 25 MG PO TABS
25.0000 mg | ORAL_TABLET | Freq: Three times a day (TID) | ORAL | Status: DC
  Administered 2021-09-09 – 2021-09-14 (×10): 25 mg via ORAL
  Filled 2021-09-09 (×11): qty 1

## 2021-09-09 MED ORDER — ALLOPURINOL 100 MG PO TABS
100.0000 mg | ORAL_TABLET | Freq: Every day | ORAL | Status: DC
  Administered 2021-09-11 – 2021-09-17 (×7): 100 mg via ORAL
  Filled 2021-09-09 (×7): qty 1

## 2021-09-09 MED ORDER — MOMETASONE FURO-FORMOTEROL FUM 200-5 MCG/ACT IN AERO
2.0000 | INHALATION_SPRAY | Freq: Two times a day (BID) | RESPIRATORY_TRACT | Status: DC
  Administered 2021-09-10 – 2021-09-17 (×13): 2 via RESPIRATORY_TRACT
  Filled 2021-09-09: qty 8.8

## 2021-09-09 MED ORDER — POLYETHYLENE GLYCOL 3350 17 G PO PACK: 17.0000 g | PACK | Freq: Every day | ORAL | Status: DC | PRN

## 2021-09-09 MED ORDER — PREDNISONE 5 MG PO TABS
10.0000 mg | ORAL_TABLET | Freq: Every day | ORAL | Status: DC
  Administered 2021-09-11 – 2021-09-17 (×7): 10 mg via ORAL
  Filled 2021-09-09 (×8): qty 2

## 2021-09-09 MED ORDER — CLOPIDOGREL BISULFATE 75 MG PO TABS
75.0000 mg | ORAL_TABLET | Freq: Every day | ORAL | Status: DC
  Administered 2021-09-11 – 2021-09-17 (×7): 75 mg via ORAL
  Filled 2021-09-09 (×7): qty 1

## 2021-09-09 MED ORDER — POTASSIUM CHLORIDE CRYS ER 10 MEQ PO TBCR: 20.0000 meq | EXTENDED_RELEASE_TABLET | Freq: Every day | ORAL | Status: DC

## 2021-09-09 MED ORDER — PANTOPRAZOLE SODIUM 40 MG PO TBEC
40.0000 mg | DELAYED_RELEASE_TABLET | Freq: Two times a day (BID) | ORAL | Status: DC
  Administered 2021-09-09 – 2021-09-17 (×14): 40 mg via ORAL
  Filled 2021-09-09 (×14): qty 1

## 2021-09-09 MED ORDER — ALBUTEROL SULFATE (2.5 MG/3ML) 0.083% IN NEBU
2.5000 mg | INHALATION_SOLUTION | Freq: Four times a day (QID) | RESPIRATORY_TRACT | Status: DC | PRN
Start: 2021-09-09 — End: 2021-09-18

## 2021-09-09 MED ORDER — SUCRALFATE 1 G PO TABS
2.0000 g | ORAL_TABLET | Freq: Two times a day (BID) | ORAL | Status: DC
  Administered 2021-09-09 – 2021-09-17 (×14): 2 g via ORAL
  Filled 2021-09-09 (×14): qty 2

## 2021-09-09 MED ORDER — CYCLOSPORINE 0.05 % OP EMUL
1.0000 [drp] | Freq: Two times a day (BID) | OPHTHALMIC | Status: DC
Start: 2021-09-09 — End: 2021-09-18
  Administered 2021-09-09 – 2021-09-17 (×14): 1 [drp] via OPHTHALMIC
  Filled 2021-09-09 (×18): qty 30

## 2021-09-09 MED ORDER — ASPIRIN 81 MG PO TBEC
81.0000 mg | DELAYED_RELEASE_TABLET | Freq: Every day | ORAL | Status: DC
  Administered 2021-09-11 – 2021-09-17 (×7): 81 mg via ORAL
  Filled 2021-09-09 (×7): qty 1

## 2021-09-09 MED ORDER — FUROSEMIDE 40 MG PO TABS: 160.0000 mg | ORAL_TABLET | Freq: Every day | ORAL | Status: DC

## 2021-09-09 MED ORDER — VITAMIN B-12 1000 MCG PO TABS
1000.0000 ug | ORAL_TABLET | Freq: Every day | ORAL | Status: DC
  Administered 2021-09-11 – 2021-09-17 (×7): 1000 ug via ORAL
  Filled 2021-09-09 (×7): qty 1

## 2021-09-09 MED ORDER — ISOSORBIDE MONONITRATE ER 60 MG PO TB24
90.0000 mg | ORAL_TABLET | Freq: Every day | ORAL | Status: DC
  Administered 2021-09-11 – 2021-09-15 (×5): 90 mg via ORAL
  Filled 2021-09-09 (×5): qty 1

## 2021-09-09 MED ORDER — METOPROLOL SUCCINATE ER 50 MG PO TB24
50.0000 mg | ORAL_TABLET | Freq: Every day | ORAL | Status: DC
  Administered 2021-09-10 – 2021-09-17 (×8): 50 mg via ORAL
  Filled 2021-09-09 (×7): qty 1

## 2021-09-09 MED ORDER — ROSUVASTATIN CALCIUM 20 MG PO TABS
20.0000 mg | ORAL_TABLET | Freq: Every evening | ORAL | Status: DC
  Administered 2021-09-11 – 2021-09-16 (×6): 20 mg via ORAL
  Filled 2021-09-09 (×6): qty 1

## 2021-09-09 NOTE — ED Notes (Signed)
Patient son [name] would like to speak to provider about moms [phone]

## 2021-09-09 NOTE — Assessment & Plan Note (Signed)
Noted  

## 2021-09-09 NOTE — Assessment & Plan Note (Signed)
Creatinine has worsened to 4.75 from a prior of 4.03.  Patient reports recent follow-up with nephrologist and was told that she will now have to start on dialysis.  Does not yet have dialysis access. -she still appears hypervolemic on exam and is on '160mg'$  of Lasix at home. Will hold tonight but advise resuming tomorrow since she has tenuous fluid status. If renal function worsens, would need nephrology consult to likely expedite plans of HD dialysis while inpatient.

## 2021-09-09 NOTE — ED Notes (Signed)
C-Collar applied

## 2021-09-09 NOTE — Assessment & Plan Note (Signed)
Secondary OHS  Continue 2.5 to 3 L.  Currently remains at her baseline.

## 2021-09-09 NOTE — H&P (Incomplete)
History and Physical    Patient: [name]:[id number] DOB: 09/22/1945 DOA: 09/09/2021 DOS: the patient was seen and examined on 09/10/2021 PCP: Lujean Amel, MD  Patient coming from: Home  Chief Complaint:  Chief Complaint  Patient presents with   Level 2   Fall   HPI: [name] is a 76 y.o. female with medical history significant of CAD s/p CABG in 2016, HFpEF,s/p CRT-pacemaker, chronic hypoxemic respiratory failure on 2.5 to 3 L, adrenal insufficiency, T2DM, HTN, CKD stage 4, OSA, asthma s/p fall.  Pt was walking with walker and fell when she turned landing on her buttocks and hit her forehead. She is on Plavix. She denies any lightheadedness or dizziness. No chest pain or shortness of breath.   She was found to have an unstable at T9-10 fracture and neurosurgery has deemed it an unstable fracture and will need OR tomorrow.  There is also concerns of nonunion or recurrent fracture at the site of the previous ORIF of the left femur. Family is concerned because of her heart and kidney disease. She was seen earlier this year for hip fracture and postoperatively had ATN briefly requiring dialysis. Access was later removed. However pt tells me that she had follow with nephrology last week and was told she now needs dialysis indefinitely.  She is awaiting callback on next steps.  She currently has AKI with creatinine of 4.75 up from 4.03.  She takes 160 mg of furosemide daily and still appears hypervolemic on exam with lower extremity edema although she reports this is no worse than usual.  She remained on her home 2.5-3 L O2.  Review of Systems: As mentioned in the history of present illness. All other systems reviewed and are negative. Past Medical History:  Diagnosis Date   AICD (automatic cardioverter/defibrillator) present    downgraded to CRT-P in 2014   Anemia    Arthritis    Asthma    Atrial fibrillation ([place]) 10/24/2015   Questionable history of A Fib/A  Flutter. On device interrogation on 9/7, showed 0.43mn of A Fib/A Flutter with 1% burden.  Device check in Jan 2018 showed no sustained AF (runs of less than 30 seconds). No anticoagulation indicated.   CAD (coronary artery disease)    a. 10/09/16 LHC: SVG->LAD patent, SVG->Diag patent, SVG->RCA patent, SVG->LCx occluded. EF 60%, b. 10/31/16 LHC DES to AV groove Circ, DES to intermed branch   CHF (congestive heart failure) (HCC)    Chronic lower back pain    Fatty liver disease, nonalcoholic    GERD (gastroesophageal reflux disease)    Gout    H/O hiatal hernia    High cholesterol    Hyperplastic colon polyp    Hypertension    IBS (irritable bowel syndrome)    Internal hemorrhoids    INTERNAL HEMORRHOIDS WITHOUT MENTION COMP 04/12/2007   Qualifier: Diagnosis of  By: BOlevia PerchesMD, Dora M    [place]ischemic cardiomyopathy ([place] 11/22/2011   Pt responded to BiV ICD- last EF 55-60% Sept 2017   Obesity    OSA (obstructive sleep apnea)    "can't tolerate a mask", wears 2L nocturnal O2   Presence of permanent cardiac pacemaker    11/02/12 [employer] V273 INTUA PPM   [place] 09[place]   Type II diabetes mellitus ([place]    Past Surgical History:  Procedure Laterality Date   ABDOMINAL ULTRASOUND  12/01/2011   Peripelvic cysts- #1- 2.4x1.9x2.3cm, #2-1.2x0.9x1.2cm   ANKLE FRACTURE SURGERY Right    "had  rod put in"   ANTERIOR CERVICAL DECOMP/DISCECTOMY FUSION     APPENDECTOMY     BACK SURGERY     BIOPSY  12/29/2017   Procedure: BIOPSY;  Surgeon: [name], MD;  [facility];  Service: Endoscopy;;   BIV ICD INSERTION CRT-D  2001?   BIV PACEMAKER GENERATOR CHANGE OUT N/A 11/02/2012   Procedure: BIV PACEMAKER GENERATOR CHANGE OUT;  Surgeon: Sanda Klein, MD;  Location: [place] [facility] LAB;  Service: Cardiovascular;  Laterality: N/A;   CARDIAC CATHETERIZATION  05/17/1999   No significant coronary obstructive disease w/ mild 20% luminal irregularity of the first diag branch of the  LAD   CARDIAC CATHETERIZATION  07/08/2002   No significant CAD, moderately depressed LV systolic function   CARDIAC CATHETERIZATION Bilateral 04/26/2007   Normal findings, recommend medical therapy   CARDIAC CATHETERIZATION  02/18/2008   Moderate CAD, would benefit from having a functional study, recommend continue medical therapy   CARDIAC CATHETERIZATION  07/23/2012   Medical therapy   CARDIAC CATHETERIZATION N/A 11/24/2014   Procedure: Left Heart Cath and Coronary Angiography;  Surgeon: Troy Sine, MD;  Location: [place] CV LAB;  Service: Cardiovascular;  Laterality: N/A;   CARDIAC CATHETERIZATION  11/27/2014   Procedure: Intravascular Pressure Wire/FFR Study;  Surgeon: Peter M Martinique, MD;  Location: [place] CV LAB;  Service: Cardiovascular;;   CARDIAC CATHETERIZATION  10/09/2016   CHOLECYSTECTOMY N/A 04/09/2018   Procedure: LAPAROSCOPIC CHOLECYSTECTOMY;  Surgeon: Greer Pickerel, MD;  [facility];  Service: General;  Laterality: N/A;   COLONOSCOPY WITH PROPOFOL N/A 12/29/2017   Procedure: COLONOSCOPY WITH PROPOFOL;  Surgeon: [name], MD;  [facility];  Service: Endoscopy;  Laterality: N/A;   CORONARY ANGIOGRAM  2010   CORONARY ARTERY BYPASS GRAFT N/A 11/29/2014   Procedure: CORONARY ARTERY BYPASS GRAFTING x 5 (LIMA-LAD, SVG-D, SVG-OM1-OM2, SVG-PD);  Surgeon: Melrose Nakayama, MD;  Location: [place];  Service: Open Heart Surgery;  Laterality: N/A;   CORONARY STENT INTERVENTION N/A 10/31/2016   Procedure: CORONARY STENT INTERVENTION;  Surgeon: Burnell Blanks, MD;  Location: [place] CV LAB;  Service: Cardiovascular;  Laterality: N/A;   ESOPHAGOGASTRODUODENOSCOPY (EGD) WITH PROPOFOL N/A 12/29/2017   Procedure: ESOPHAGOGASTRODUODENOSCOPY (EGD) WITH PROPOFOL;  Surgeon: [name], MD;  [facility];  Service: Endoscopy;  Laterality: N/A;   FRACTURE SURGERY     INSERT / REPLACE / REMOVE PACEMAKER  1999   INTRAMEDULLARY (IM) NAIL  INTERTROCHANTERIC Left 04/04/2021   Procedure: INTRAMEDULLARY (IM) NAIL INTERTROCHANTRIC;  Surgeon: Vanetta Mulders, MD;  Location: [place];  Service: Orthopedics;  Laterality: Left;   IR FLUORO GUIDE CV LINE LEFT  04/11/2021   IR US GUIDE VASC ACCESS LEFT  04/11/2021   KNEE ARTHROSCOPY Bilateral    LEFT HEART CATH AND CORS/GRAFTS ANGIOGRAPHY N/A 12/09/2017   Procedure: LEFT HEART CATH AND CORS/GRAFTS ANGIOGRAPHY;  Surgeon: Troy Sine, MD;  Location: [place] CV LAB;  Service: Cardiovascular;  Laterality: N/A;   LEFT HEART CATHETERIZATION WITH CORONARY ANGIOGRAM N/A 07/23/2012   Procedure: LEFT HEART CATHETERIZATION WITH CORONARY ANGIOGRAM;  Surgeon: Leonie Man, MD;  [facility];  Service: Cardiovascular;  Laterality: N/A;   LEXISCAN MYOVIEW  11/14/2011   Mild-moderate defect seen in Mid Inferolateral and Mid Anterolateral regions-consistant w/ infarct/scar. No significant ischemia demonstrated.   POLYPECTOMY  12/29/2017   Procedure: POLYPECTOMY;  Surgeon: [name], MD;  [facility];  Service: Endoscopy;;   PPM GENERATOR CHANGEOUT N/A 12/31/2020  Procedure: PPM GENERATOR CHANGEOUT;  Surgeon: Sanda Klein, MD;  Location: [place] CV LAB;  Service: Cardiovascular;  Laterality: N/A;   RIGHT/LEFT HEART CATH AND CORONARY ANGIOGRAPHY N/A 10/09/2016   Procedure: RIGHT/LEFT HEART CATH AND CORONARY ANGIOGRAPHY;  Surgeon: Jolaine Artist, MD;  Location: [place] CV LAB;  Service: Cardiovascular;  Laterality: N/A;   RIGHT/LEFT HEART CATH AND CORONARY/GRAFT ANGIOGRAPHY N/A 03/11/2019   Procedure: RIGHT/LEFT HEART CATH AND CORONARY/GRAFT ANGIOGRAPHY;  Surgeon: Jolaine Artist, MD;  Location: [place] CV LAB;  Service: Cardiovascular;  Laterality: N/A;   TEE WITHOUT CARDIOVERSION N/A 11/29/2014   Procedure: TRANSESOPHAGEAL ECHOCARDIOGRAM (TEE);  Surgeon: Melrose Nakayama, MD;  Location: [place];  Service: Open Heart Surgery;  Laterality: N/A;    TRANSCAROTID ARTERY REVASCULARIZATION  Right 10/01/2020   Procedure: RIGHT TRANSCAROTID ARTERY REVASCULARIZATION;  Surgeon: [name], MD;  Location: [place];  Service: Vascular;  Laterality: Right;   TRANSTHORACIC ECHOCARDIOGRAM  07/23/2012   EF 55-60%, normal-mild   TUBAL LIGATION     ULTRASOUND GUIDANCE FOR VASCULAR ACCESS Left 10/01/2020   Procedure: ULTRASOUND GUIDANCE FOR VASCULAR ACCESS;  Surgeon: [name], MD;  Location: [place];  Service: Vascular;  Laterality: Left;   Social History:  reports that she quit smoking about 53 years ago. Her smoking use included cigarettes. She has a 0.75 pack-year smoking history. She has never used smokeless tobacco. She reports that she does not drink alcohol and does not use drugs.  Allergies  Allergen Reactions   Ivp Dye [Iodinated Contrast Media] Shortness Of Breath    No reaction to PO contrast with non-ionic dye.06-25-2014/rsm   Shellfish Allergy Anaphylaxis   Sulfa Antibiotics Shortness Of Breath   Iodine Hives   Atorvastatin Other (See Comments)    Pt states "causes bilateral leg pain/cramps."   Benadryl [Diphenhydramine] Other (See Comments)    "THIS DRIVES ME CRAZY AND MAKES ME FEEL LIKE I AM DYING"    Colchicine Nausea And Vomiting   Doxycycline Other (See Comments)    Unknown reaction, per patient   Uloric [Febuxostat] Other (See Comments)    Unknown reaction   Cephalexin Itching and Rash   Zithromax [Azithromycin] Rash    Family History  Problem Relation Age of Onset   Breast cancer Mother    Diabetes Mother    Heart disease Maternal Grandmother    Kidney disease Maternal Grandmother    Diabetes Maternal Grandmother    Glaucoma Maternal Aunt    Heart disease Maternal Aunt    Asthma Sister    Colon cancer Neg Hx    Stomach cancer Neg Hx    Pancreatic cancer Neg Hx     Prior to Admission medications   Medication Sig Start Date End Date Taking? Authorizing Provider  ACCU-CHEK AVIVA PLUS test strip  Check blood sugar twice daily 10/19/19   [provider]  Accu-Chek Softclix Lancets lancets Check blood sugar twice daily 08/10/19   [provider]  acetaminophen (TYLENOL) 500 MG tablet Take 1,000 mg by mouth 3 (three) times daily as needed (for pain or headaches).    [provider]  albuterol (PROVENTIL HFA;VENTOLIN HFA) 108 (90 Base) MCG/ACT inhaler Inhale 2 puffs into the lungs every 6 (six) hours as needed for wheezing or shortness of breath. 05/02/18   Fuller Plan A, MD  albuterol (PROVENTIL) (2.5 MG/3ML) 0.083% nebulizer solution Take 3 mLs (2.5 mg total) by nebulization every 6 (six) hours as needed for wheezing or shortness of breath. Patient not  taking: Reported on 09/02/2021 05/02/18   Norval Morton, MD  Alcohol Swabs (B-D SINGLE USE SWABS REGULAR) PADS  08/10/19   [provider]  allopurinol (ZYLOPRIM) 100 MG tablet Take 1 tablet (100 mg total) by mouth daily. Patient taking differently: Take 100 mg by mouth every morning. 03/14/21   Croitoru, [name], MD  aspirin EC 81 MG tablet Take 1 tablet (81 mg total) by mouth daily. Patient taking differently: Take 81 mg by mouth every morning. 03/27/16   Erlene Quan, PA-C  budesonide-formoterol (SYMBICORT) 160-4.5 MCG/ACT inhaler Inhale 2 puffs into the lungs 2 (two) times daily.    [provider]  clopidogrel (PLAVIX) 75 MG tablet Take 1 tablet (75 mg total) by mouth daily. Patient taking differently: Take 75 mg by mouth every morning. 02/28/21   Croitoru, [name], MD  cycloSPORINE (RESTASIS) 0.05 % ophthalmic emulsion Place 1 drop into both eyes 2 (two) times daily.    [provider]  EPINEPHrine 0.3 mg/0.3 mL IJ SOAJ injection Inject 0.3 mg into the muscle once as needed for anaphylaxis (severe allergic reaction). Patient not taking: Reported on 09/02/2021    [provider]  furosemide (LASIX) 80 MG tablet Take 2 tablets (160 mg total) by mouth daily. 08/16/21 10/15/21  Caren Griffins, MD  hydrALAZINE (APRESOLINE) 25 MG tablet Take 1 tablet (25 mg total) by mouth every 8 (eight) hours. 08/15/21   Caren Griffins, MD  hydrOXYzine (ATARAX) 25 MG tablet Take 25 mg by mouth 2 (two) times daily as needed for itching. Patient not taking: Reported on 09/02/2021 06/07/21   [provider]  isosorbide mononitrate (IMDUR) 30 MG 24 hr tablet TAKE 3 TABLETS BY MOUTH  DAILY Patient taking differently: Take 90 mg by mouth every morning. 05/15/21   Croitoru, [name], MD  Lidocaine 0.5 % AERO Place 1 spray rectally every 6 (six) hours as needed (hemorrhoids). Preparation H rapid relief lidocaine spray Patient not taking: Reported on 09/02/2021    [provider]  linaclotide Rolan Lipa) 145 MCG CAPS capsule Take 145 mcg by mouth daily before breakfast. Patient not taking: Reported on 09/02/2021    [provider]  metoprolol succinate (TOPROL-XL) 50 MG 24 hr tablet Take 1 tablet (50 mg total) by mouth daily. Take with or immediately following a meal. 08/16/21   Gherghe, Vella Redhead, MD  nitroGLYCERIN (NITROSTAT) 0.4 MG SL tablet Place 1 tablet (0.4 mg total) under the tongue every 5 (five) minutes as needed for chest pain. Patient not taking: Reported on 09/02/2021 11/13/16 03/18/22  Erlene Quan, PA-C  ondansetron (ZOFRAN) 4 MG tablet Take 4 mg by mouth daily as needed for nausea or vomiting. Patient not taking: Reported on 09/02/2021 07/24/21   [provider]  OXYGEN Inhale 2-3 L/min into the lungs continuous.    [provider]  oxymetazoline (AFRIN) 0.05 % nasal spray Place 1 spray into both nostrils 2 (two) times daily as needed for congestion.    [provider]  pantoprazole (PROTONIX) 40 MG tablet Take 1 tablet (40 mg total) by mouth 2 (two) times daily. 07/08/19   [name], MD  polyethylene glycol (MIRALAX / GLYCOLAX) 17 g packet Take 17 g by mouth daily as needed (constipation).    [provider]  Potassium Chloride ER 20 MEQ  TBCR Take 20 mEq by mouth daily in the afternoon. 08/29/21   [provider]  predniSONE (DELTASONE) 10 MG tablet Take 1 tablet (10 mg total) by mouth  daily with breakfast. 08/15/21   Caren Griffins, MD  rosuvastatin (CRESTOR) 20 MG tablet Take 1 tablet (20 mg total) by mouth every evening. NEED OV. 06/25/21   Croitoru, Dani Gobble, MD  sucralfate (CARAFATE) 1 g tablet Take 1 tablet (1 g total) by mouth 4 (four) times daily -  with meals and at bedtime. Patient taking differently: Take 2 g by mouth 2 (two) times daily. 04/28/21   Caren Griffins, MD  traMADol (ULTRAM) 50 MG tablet Take 50 mg by mouth daily as needed (pain). 06/07/21   [provider]  vitamin B-12 (CYANOCOBALAMIN) 1000 MCG tablet Take 1 tablet (1,000 mcg total) by mouth daily. 08/15/21   Caren Griffins, MD    Physical Exam: Vitals:   09/09/21 2015 09/09/21 2030 09/09/21 2051 09/10/21 0002  BP: (!) 108/53 (!) 115/57 (!) 129/91 (!) 103/52  Pulse: 80 80 80 78  Resp: '20 20  18  '$ Temp:   98.1 F (36.7 C) 98 F (36.7 C)  TempSrc:   Oral Oral  SpO2: 100% 100% 100% 100%  Weight:      Height:       Constitutional: NAD, calm, comfortable, morbidly obese female laying at approximately 20 degree incline in bed Eyes: lids and conjunctivae normal ENMT: Mucous membranes are moist.  Neck: normal, supple Respiratory: clear to auscultation anteriorly, no wheezing, no crackles. Normal respiratory effort. No accessory muscle use.  On home 3 L of oxygen via nasal cannula Cardiovascular: Regular rate and rhythm, no murmurs / rubs / gallops.  +4 pitting edema of bilateral lower extremity up to mid pretibial region.  Abdomen: no tenderness, Bowel sounds positive.  Musculoskeletal: no clubbing / cyanosis. No joint deformity upper and lower extremities. Normal muscle tone.  Skin: no rashes, lesions, ulcers. No induration Neurologic: CN 2-12 grossly intact. Sensation intact Strength 5/5 in all 4.  Psychiatric: Normal judgment and  insight. Alert and oriented x 3. Normal mood. Data Reviewed:  See HPI  Assessment and Plan: * T9 vertebral fracture ([place]) Deemed unstable by Neurology and will be taking to OR for surgery tomorrow. NPO after midnight. -pt and family is concerned about surgery due to her chronic comorbities. Her revised cardiac risk index is at 15% 30day risk of death, MI or cardiac arrest. They want cardiology for pre-operative evaluation which is reasonable but I discuss with her and her son that her spinal fracture is unstable as determined by neurosurgery and therefore is not elective and will be debilitating if she does not undergo this procedure.  OSA (obstructive sleep apnea) Intolerant to CPAP  Iatrogenic adrenal insufficiency ([place]) She is on chronic prednisone for COPD.  She has had history of hypotension unresponsive to pressors in the past.  Episodes of unexplained hypotension that do not respond to volume resuscitation is likely to be adrenal crisis.  History of fracture of left hip History of left hip ORIF in 03/2021. -Left hip is noted on CT chest to have a possible nonunion versus recurrent fracture.  She does reports pain today to her left hip.  Obtain stat CT left hip to further evaluate  Chronic hypoxemic respiratory failure (HCC) Secondary OHS  Continue 2.5 to 3 L.  Currently remains at her baseline.  Acute kidney injury superimposed on chronic kidney disease (HCC) Creatinine has worsened to 4.75 from a prior of 4.03.  Patient reports recent follow-up with nephrologist and was told that she will now have to start on dialysis.  Does not yet have dialysis access. -  she still appears hypervolemic on exam and is on '160mg'$  of Lasix at home. Will hold tonight but advise resuming tomorrow since she has tenuous fluid status. If renal function worsens, would need nephrology consult to likely expedite plans of HD dialysis while inpatient.   Chronic diastolic CHF (congestive heart failure) (HCC) she  still appears hypervolemic on exam and is on '160mg'$  of Lasix at home but has worsening kidney function. Will hold tonight but advise resuming tomorrow since she has tenuous fluid status.  -continue Imdur, beta-blocker and statin  S/P CABG x 5 Currently asymptomatic from a cardiac standpoint -continue aspirin and plavix  Cardiac resynchronization therapy pacemaker (CRT-P) in place Noted  HTN (hypertension), benign Continue hydralazine      Advance Care Planning:   Code Status: Full Code full  Consults: NEUROSURGERY  Family Communication: Discussed with son Laverna Peace  Severity of Illness: The appropriate patient status for this patient is INPATIENT. Inpatient status is judged to be reasonable and necessary in order to provide the required intensity of service to ensure the patient's safety. The patient's presenting symptoms, physical exam findings, and initial radiographic and laboratory data in the context of their chronic comorbidities is felt to place them at high risk for further clinical deterioration. Furthermore, it is not anticipated that the patient will be medically stable for discharge from the hospital within 2 midnights of admission.   * I certify that at the point of admission it is my clinical judgment that the patient will require inpatient hospital care spanning beyond 2 midnights from the point of admission due to high intensity of service, high risk for further deterioration and high frequency of surveillance required.*  Author: Orene Desanctis, DO 09/10/2021 2:54 AM  For on call review [email or web address].

## 2021-09-09 NOTE — Assessment & Plan Note (Signed)
Intolerant to CPAP

## 2021-09-09 NOTE — ED Triage Notes (Signed)
Pt arrived via [facility] from home as a Level 2 Fall on thinners. Per EMS was walking with her walker when she fell over. Unsure why she fell over but did states her legs do give out at times. Pt is on Plavix, with a hx of falls in feb where she broken pelvis. Presents with hip and lower back pain. Pt states she hit head on bathroom tile floor. Also states stomach hasn't felt great and has had some diarrhea but states it is nothing new. Pt arrived with bilateral lower extremity edema which she states is normal due to her CHF. Denies dizzy, N/V.   142/72, 86HR, 98% 3L [place], CBG 106

## 2021-09-09 NOTE — Assessment & Plan Note (Addendum)
Deemed unstable by Neurology and will be taking to OR for surgery tomorrow. NPO after midnight. -pt and family is concerned about surgery due to her chronic comorbities. Her revised cardiac risk index is at 15% 30day risk of death, MI or cardiac arrest. They want cardiology for pre-operative evaluation which is reasonable but I discuss with her and her son that her spinal fracture is unstable as determined by neurosurgery and therefore is not elective and will be debilitating if she does not undergo this procedure.

## 2021-09-09 NOTE — Consult Note (Signed)
Pt is s/p fall with back pain. Imaging reviewed, pt has DISH with fracture-deformity at T9-10. Unfortunately, this does appear to be an unstable fracture, will need to be repaired in the OR tomorrow. Please keep NPO after midnight, will discuss with the patient and update recs accordingly.

## 2021-09-09 NOTE — ED Provider Notes (Signed)
[name] Provider Note   CSN: [id number] Arrival date & time: 09/09/21  1423    History  Chief Complaint  Patient presents with   Level 2   [name] is a 76 y.o. female PMH cardiac pacemaker, A-fib, CHF, asthma who is presenting with fall.  EMS provides history.  They report that the patient was using her walker to ambulate around her house, when she "fell over".  Patient does not know why she fell.  She has a history of her knees "giving out on her", and denies any dizziness, lightheadedness, chest pain, difficulty breathing prior to the fall.  She remembers the entire event.  She denies LOC, though she did hit her head.  Currently, she is complaining of headache, abdominal pain, left hip pain.    Home Medications Prior to Admission medications   Medication Sig Start Date End Date Taking? Authorizing Provider  ACCU-CHEK AVIVA PLUS test strip Check blood sugar twice daily 10/19/19   [provider]  Accu-Chek Softclix Lancets lancets Check blood sugar twice daily 08/10/19   [provider]  acetaminophen (TYLENOL) 500 MG tablet Take 1,000 mg by mouth 3 (three) times daily as needed (for pain or headaches).    [provider]  albuterol (PROVENTIL HFA;VENTOLIN HFA) 108 (90 Base) MCG/ACT inhaler Inhale 2 puffs into the lungs every 6 (six) hours as needed for wheezing or shortness of breath. 05/02/18   Fuller Plan A, MD  albuterol (PROVENTIL) (2.5 MG/3ML) 0.083% nebulizer solution Take 3 mLs (2.5 mg total) by nebulization every 6 (six) hours as needed for wheezing or shortness of breath. Patient not taking: Reported on 09/02/2021 05/02/18   Norval Morton, MD  Alcohol Swabs (B-D SINGLE USE SWABS REGULAR) PADS  08/10/19   [provider]  allopurinol (ZYLOPRIM) 100 MG tablet Take 1 tablet (100 mg total) by mouth daily. Patient taking differently: Take 100 mg by mouth every morning. 03/14/21   Croitoru,  [name], MD  aspirin EC 81 MG tablet Take 1 tablet (81 mg total) by mouth daily. Patient taking differently: Take 81 mg by mouth every morning. 03/27/16   Erlene Quan, PA-C  budesonide-formoterol (SYMBICORT) 160-4.5 MCG/ACT inhaler Inhale 2 puffs into the lungs 2 (two) times daily.    [provider]  clopidogrel (PLAVIX) 75 MG tablet Take 1 tablet (75 mg total) by mouth daily. Patient taking differently: Take 75 mg by mouth every morning. 02/28/21   Croitoru, [name], MD  cycloSPORINE (RESTASIS) 0.05 % ophthalmic emulsion Place 1 drop into both eyes 2 (two) times daily.    [provider]  EPINEPHrine 0.3 mg/0.3 mL IJ SOAJ injection Inject 0.3 mg into the muscle once as needed for anaphylaxis (severe allergic reaction). Patient not taking: Reported on 09/02/2021    [provider]  furosemide (LASIX) 80 MG tablet Take 2 tablets (160 mg total) by mouth daily. 08/16/21 10/15/21  Caren Griffins, MD  hydrALAZINE (APRESOLINE) 25 MG tablet Take 1 tablet (25 mg total) by mouth every 8 (eight) hours. 08/15/21   Caren Griffins, MD  hydrOXYzine (ATARAX) 25 MG tablet Take 25 mg by mouth 2 (two) times daily as needed for itching. Patient not taking: Reported on 09/02/2021 06/07/21   [provider]  isosorbide mononitrate (IMDUR) 30 MG 24 hr tablet TAKE 3 TABLETS BY MOUTH  DAILY Patient taking differently: Take 90 mg by mouth every morning. 05/15/21   Croitoru, Dani Gobble, MD  Lidocaine 0.5 % AERO Place 1 spray rectally every 6 (six) hours as needed (hemorrhoids). Preparation H rapid relief lidocaine spray Patient not taking: Reported on 09/02/2021    [provider]  linaclotide Rolan Lipa) 145 MCG CAPS capsule Take 145 mcg by mouth daily before breakfast. Patient not taking: Reported on 09/02/2021    [provider]  metoprolol succinate (TOPROL-XL) 50 MG 24 hr tablet Take 1 tablet (50 mg total) by mouth daily. Take with or immediately following a meal. 08/16/21    Gherghe, Vella Redhead, MD  nitroGLYCERIN (NITROSTAT) 0.4 MG SL tablet Place 1 tablet (0.4 mg total) under the tongue every 5 (five) minutes as needed for chest pain. Patient not taking: Reported on 09/02/2021 11/13/16 03/18/22  Erlene Quan, PA-C  ondansetron (ZOFRAN) 4 MG tablet Take 4 mg by mouth daily as needed for nausea or vomiting. Patient not taking: Reported on 09/02/2021 07/24/21   [provider]  OXYGEN Inhale 2-3 L/min into the lungs continuous.    [provider]  oxymetazoline (AFRIN) 0.05 % nasal spray Place 1 spray into both nostrils 2 (two) times daily as needed for congestion.    [provider]  pantoprazole (PROTONIX) 40 MG tablet Take 1 tablet (40 mg total) by mouth 2 (two) times daily. 07/08/19   [name], MD  polyethylene glycol (MIRALAX / GLYCOLAX) 17 g packet Take 17 g by mouth daily as needed (constipation).    [provider]  Potassium Chloride ER 20 MEQ TBCR Take 20 mEq by mouth daily in the afternoon. 08/29/21   [provider]  predniSONE (DELTASONE) 10 MG tablet Take 1 tablet (10 mg total) by mouth daily with breakfast. 08/15/21   Caren Griffins, MD  rosuvastatin (CRESTOR) 20 MG tablet Take 1 tablet (20 mg total) by mouth every evening. NEED OV. 06/25/21   Croitoru, Dani Gobble, MD  sucralfate (CARAFATE) 1 g tablet Take 1 tablet (1 g total) by mouth 4 (four) times daily -  with meals and at bedtime. Patient taking differently: Take 2 g by mouth 2 (two) times daily. 04/28/21   Caren Griffins, MD  traMADol (ULTRAM) 50 MG tablet Take 50 mg by mouth daily as needed (pain). 06/07/21   [provider]  vitamin B-12 (CYANOCOBALAMIN) 1000 MCG tablet Take 1 tablet (1,000 mcg total) by mouth daily. 08/15/21   Caren Griffins, MD      Allergies    Ivp dye [iodinated contrast media], Shellfish allergy, Sulfa antibiotics, Iodine, Atorvastatin, Benadryl [diphenhydramine], Colchicine, Doxycycline, Uloric [febuxostat], Cephalexin,  and Zithromax [azithromycin]    Review of Systems   Review of Systems As in HPI.  Physical Exam Updated Vital Signs BP 133/69 (BP Location: Left Arm)   Pulse 86   Resp 18   Ht '5\' 3"'$  (1.6 m)   Wt 89.8 kg   SpO2 99%   BMI 35.07 kg/m   Physical Exam Vitals and nursing note reviewed.  Constitutional:      General: She is not in acute distress.    Appearance: She is well-developed. She is obese. She is not ill-appearing.  HENT:     Head: Normocephalic and atraumatic.     Right Ear: External ear normal.     Left Ear: External ear normal.     Nose: Nose normal.  Neck:     Comments: Tenderness along posterior neck. Cardiovascular:     Rate and Rhythm: Normal rate and regular rhythm.     Heart sounds: No murmur  heard. Pulmonary:     Effort: Pulmonary effort is normal. No respiratory distress.     Breath sounds: Normal breath sounds.  Abdominal:     Palpations: Abdomen is soft.     Tenderness: There is abdominal tenderness.     Comments: Tenderness to palpation LUQ.  Musculoskeletal:     Cervical back: Neck supple. Tenderness present.     Right lower leg: Edema present.     Left lower leg: Edema present.     Comments: Pitting edema bilateral lower extremities.  Skin:    General: Skin is warm and dry.  Neurological:     Mental Status: She is alert.    ED Results / Procedures / Treatments   Labs (all labs ordered are listed, but only abnormal results are displayed) Labs Reviewed  RESP PANEL BY RT-PCR (FLU A&B, COVID) [id number]  COMPREHENSIVE METABOLIC PANEL  CBC  ETHANOL  URINALYSIS, ROUTINE W REFLEX MICROSCOPIC  LACTIC ACID, PLASMA  PROTIME-INR  I-STAT CHEM 8, ED  SAMPLE TO BLOOD BANK    EKG EKG Interpretation  Date/Time:  Monday September 09 2021 14:45:00 EDT Ventricular Rate:  84 PR Interval:  214 QRS Duration: 164 QT Interval:  454 QTC Calculation: 537 R Axis:   118 Text Interpretation: Electronic ventricular pacemaker No significant change since last  tracing Confirmed by Blanchie Dessert [phone]) on 09/09/2021 2:58:00 PM  Radiology No results found.  Procedures Procedures   Medications Ordered in ED Medications - No data to display  ED Course/ Medical Decision Making/ A&P    Medical Decision Making Amount and/or Complexity of Data Reviewed Labs: ordered. Radiology: ordered.   Medical Decision Making:  [name] is a 76 y.o. female PMH as above, who presented to the ED today as a level 2 trauma after a fall (on plavix) complaining of headache, abdominal pain, and left hip pain.      Reviewed and confirmed nursing documentation for past medical history, home medications, social history.   Initial Assessment:  Primary survey: Airway intact. On home O2 via nasal cannula. BL breath sounds present.  Circulation established with WNL BP, 2 large bore IVs, and radial/DP pulses.  Disability evaluation negative. No obvious disability requiring intervention.  No emergent interventions took place in the primary survey.  Patient stable for CXR that demonstrated no traumatic hemopneumothorax and PXR that demonstrated no unstable pelvic fractures. EFAST deferred.  Secondary survey: Patient exposed and secondary survey was performed.  See concurrent documentation for further information. In summary: patient had bruising to bilateral upper extremities, tenderness to palpation over left upper quadrant of abdomen, left hip.  Labs obtained.  CMP shows mild electrolyte derangements in sodium and chloride.  BUN and creatinine elevated, worsened from prior lab work last week, the patient does appear to have history of CKD.  No elevated LFTs.  No anion gap.  CBC without leukocytosis, though does demonstrate anemia.  Ethanol negative.  Additional labs have been collected and are pending at the time of signout, including lactic acid, UA, respiratory panel.  Patient stable for transfer to CT scanner for further traumatic evaluation.   Trauma  scans obtained, results pending at time of sign out. Consults may be necessary, depending on the patient's injuries seen on CT imaging.  Ultimate disposition will depend on the results of the patient's imaging, additional lab work, and an ambulation trial to ensure that patient is safe for discharge home.  Clinical Impression:  1. Fall, initial encounter     Data Unavailable  Final Clinical Impression(s) / ED Diagnoses Final diagnoses:  Fall, initial encounter    Rx / DC Orders ED Discharge Orders     None         Faylene Million, MD 09/09/21 [id number]    Blanchie Dessert, MD 09/10/21 [phone]

## 2021-09-09 NOTE — Assessment & Plan Note (Addendum)
she still appears hypervolemic on exam and is on '160mg'$  of Lasix at home but has worsening kidney function. Will hold tonight but advise resuming tomorrow since she has tenuous fluid status.  -continue Imdur, beta-blocker and statin

## 2021-09-09 NOTE — Assessment & Plan Note (Addendum)
Currently asymptomatic from a cardiac standpoint -continue aspirin and plavix

## 2021-09-10 ENCOUNTER — Inpatient Hospital Stay (HOSPITAL_COMMUNITY): Payer: Medicare Other

## 2021-09-10 ENCOUNTER — Inpatient Hospital Stay (HOSPITAL_COMMUNITY): Payer: Medicare Other | Admitting: Anesthesiology

## 2021-09-10 ENCOUNTER — Other Ambulatory Visit: Payer: Self-pay

## 2021-09-10 ENCOUNTER — Encounter (HOSPITAL_COMMUNITY): Payer: Self-pay | Admitting: Family Medicine

## 2021-09-10 ENCOUNTER — Telehealth: Payer: Self-pay

## 2021-09-10 ENCOUNTER — Encounter (HOSPITAL_COMMUNITY)
Admission: EM | Disposition: A | Discharge status: Home Health | Payer: Self-pay | Source: Home / Self Care | Attending: Family Medicine

## 2021-09-10 DIAGNOSIS — Z950 Presence of cardiac pacemaker: Secondary | ICD-10-CM | POA: Diagnosis not present

## 2021-09-10 DIAGNOSIS — Z9989 Dependence on other enabling machines and devices: Secondary | ICD-10-CM | POA: Diagnosis not present

## 2021-09-10 DIAGNOSIS — J45909 Unspecified asthma, uncomplicated: Secondary | ICD-10-CM

## 2021-09-10 DIAGNOSIS — Z01810 Encounter for preprocedural cardiovascular examination: Secondary | ICD-10-CM

## 2021-09-10 DIAGNOSIS — G4733 Obstructive sleep apnea (adult) (pediatric): Secondary | ICD-10-CM | POA: Diagnosis not present

## 2021-09-10 DIAGNOSIS — E2749 Other adrenocortical insufficiency: Secondary | ICD-10-CM

## 2021-09-10 DIAGNOSIS — N179 Acute kidney failure, unspecified: Secondary | ICD-10-CM | POA: Diagnosis not present

## 2021-09-10 DIAGNOSIS — Z8781 Personal history of (healed) traumatic fracture: Secondary | ICD-10-CM

## 2021-09-10 DIAGNOSIS — S22079A Unspecified fracture of T9-T10 vertebra, initial encounter for closed fracture: Secondary | ICD-10-CM | POA: Diagnosis not present

## 2021-09-10 DIAGNOSIS — S22009A Unspecified fracture of unspecified thoracic vertebra, initial encounter for closed fracture: Secondary | ICD-10-CM | POA: Diagnosis not present

## 2021-09-10 DIAGNOSIS — I5032 Chronic diastolic (congestive) heart failure: Secondary | ICD-10-CM | POA: Diagnosis not present

## 2021-09-10 HISTORY — PX: LUMBAR PERCUTANEOUS PEDICLE SCREW 4 LEVEL: SHX6318

## 2021-09-10 LAB — BASIC METABOLIC PANEL
Anion gap: 12 (ref 5–15)
BUN: 109 mg/dL — ABNORMAL HIGH (ref 8–23)
CO2: 31 mmol/L (ref 22–32)
Calcium: 9.6 mg/dL (ref 8.9–10.3)
Chloride: 95 mmol/L — ABNORMAL LOW (ref 98–111)
Creatinine, Ser: 3.99 mg/dL — ABNORMAL HIGH (ref 0.44–1.00)
GFR, Estimated: 11 mL/min — ABNORMAL LOW (ref >60)
Glucose, Bld: 109 mg/dL — ABNORMAL HIGH (ref 70–99)
Potassium: 3.2 mmol/L — ABNORMAL LOW (ref 3.5–5.1)
Sodium: 138 mmol/L (ref 135–145)

## 2021-09-10 LAB — CBC
HCT: 25.6 % — ABNORMAL LOW (ref 36.0–46.0)
Hemoglobin: 8 g/dL — ABNORMAL LOW (ref 12.0–15.0)
MCH: 30.3 pg (ref 26.0–34.0)
MCHC: 31.3 g/dL (ref 30.0–36.0)
MCV: 97 fL (ref 80.0–100.0)
Platelets: 161 10*3/uL (ref 150–400)
RBC: 2.64 MIL/uL — ABNORMAL LOW (ref 3.87–5.11)
RDW: 12.9 % (ref 11.5–15.5)
WBC: 6.4 10*3/uL (ref 4.0–10.5)
nRBC: 0 % (ref 0.0–0.2)

## 2021-09-10 LAB — GLUCOSE, CAPILLARY
Glucose-Capillary: 121 mg/dL — ABNORMAL HIGH (ref 70–99)
Glucose-Capillary: 128 mg/dL — ABNORMAL HIGH (ref 70–99)

## 2021-09-10 LAB — SURGICAL PCR SCREEN
MRSA, PCR: NEGATIVE
Staphylococcus aureus: NEGATIVE

## 2021-09-10 LAB — PREPARE RBC (CROSSMATCH)

## 2021-09-10 SURGERY — LUMBAR PERCUTANEOUS PEDICLE SCREW 4 LEVEL
Anesthesia: General | Site: Spine Thoracic

## 2021-09-10 MED ORDER — LACTATED RINGERS IV SOLN: INTRAVENOUS | Status: DC

## 2021-09-10 MED ORDER — LIDOCAINE-EPINEPHRINE 1 %-1:100000 IJ SOLN
INTRAMUSCULAR | Status: DC | PRN
  Administered 2021-09-10: 10 mL

## 2021-09-10 MED ORDER — ONDANSETRON HCL 4 MG/2ML IJ SOLN
INTRAMUSCULAR | Status: AC
  Filled 2021-09-10: qty 2

## 2021-09-10 MED ORDER — ACETAMINOPHEN 160 MG/5ML PO SOLN: 325.0000 mg | Freq: Once | ORAL | Status: DC | PRN

## 2021-09-10 MED ORDER — LIDOCAINE 2% (20 MG/ML) 5 ML SYRINGE
INTRAMUSCULAR | Status: DC | PRN
  Administered 2021-09-10: 40 mg via INTRAVENOUS

## 2021-09-10 MED ORDER — ROCURONIUM BROMIDE 10 MG/ML (PF) SYRINGE
PREFILLED_SYRINGE | INTRAVENOUS | Status: AC
Start: 2021-09-10 — End: ?
  Filled 2021-09-10: qty 30

## 2021-09-10 MED ORDER — LIDOCAINE-EPINEPHRINE 1 %-1:100000 IJ SOLN
INTRAMUSCULAR | Status: AC
  Filled 2021-09-10: qty 1

## 2021-09-10 MED ORDER — PROPOFOL 10 MG/ML IV BOLUS
INTRAVENOUS | Status: DC | PRN
  Administered 2021-09-10: 100 mg via INTRAVENOUS

## 2021-09-10 MED ORDER — SUGAMMADEX SODIUM 200 MG/2ML IV SOLN
INTRAVENOUS | Status: DC | PRN
  Administered 2021-09-10: 300 mg via INTRAVENOUS

## 2021-09-10 MED ORDER — FENTANYL CITRATE (PF) 100 MCG/2ML IJ SOLN: 25.0000 ug | INTRAMUSCULAR | Status: DC | PRN

## 2021-09-10 MED ORDER — VANCOMYCIN HCL IN DEXTROSE 1-5 GM/200ML-% IV SOLN: 1000.0000 mg | Freq: Once | INTRAVENOUS | Status: AC

## 2021-09-10 MED ORDER — ONDANSETRON HCL 4 MG/2ML IJ SOLN
INTRAMUSCULAR | Status: DC | PRN
  Administered 2021-09-10: 4 mg via INTRAVENOUS

## 2021-09-10 MED ORDER — THROMBIN 5000 UNITS EX SOLR: OROMUCOSAL | Status: DC | PRN

## 2021-09-10 MED ORDER — POTASSIUM CHLORIDE CRYS ER 20 MEQ PO TBCR
20.0000 meq | EXTENDED_RELEASE_TABLET | Freq: Every day | ORAL | Status: DC
  Administered 2021-09-11 – 2021-09-14 (×4): 20 meq via ORAL
  Filled 2021-09-10: qty 1
  Filled 2021-09-10: qty 2
  Filled 2021-09-10 (×3): qty 1

## 2021-09-10 MED ORDER — ALBUMIN HUMAN 5 % IV SOLN
12.5000 g | Freq: Once | INTRAVENOUS | Status: AC
  Administered 2021-09-10: 12.5 g via INTRAVENOUS

## 2021-09-10 MED ORDER — FENTANYL CITRATE (PF) 250 MCG/5ML IJ SOLN
INTRAMUSCULAR | Status: DC | PRN
  Administered 2021-09-10: 75 ug via INTRAVENOUS
  Administered 2021-09-10: 25 ug via INTRAVENOUS
  Administered 2021-09-10 (×3): 50 ug via INTRAVENOUS

## 2021-09-10 MED ORDER — ORAL CARE MOUTH RINSE: 15.0000 mL | Freq: Once | OROMUCOSAL | Status: AC

## 2021-09-10 MED ORDER — FENTANYL CITRATE (PF) 100 MCG/2ML IJ SOLN
INTRAMUSCULAR | Status: AC
  Filled 2021-09-10: qty 2

## 2021-09-10 MED ORDER — THROMBIN 5000 UNITS EX SOLR
CUTANEOUS | Status: AC
  Filled 2021-09-10: qty 5000

## 2021-09-10 MED ORDER — HYDROMORPHONE HCL 1 MG/ML IJ SOLN
0.5000 mg | INTRAMUSCULAR | Status: DC | PRN
  Administered 2021-09-10: 0.5 mg via INTRAVENOUS
  Filled 2021-09-10: qty 0.5

## 2021-09-10 MED ORDER — CHLORHEXIDINE GLUCONATE 0.12 % MT SOLN
15.0000 mL | Freq: Once | OROMUCOSAL | Status: AC
  Administered 2021-09-10: 15 mL via OROMUCOSAL
  Filled 2021-09-10: qty 15

## 2021-09-10 MED ORDER — 0.9 % SODIUM CHLORIDE (POUR BTL) OPTIME
TOPICAL | Status: DC | PRN
  Administered 2021-09-10: 1000 mL

## 2021-09-10 MED ORDER — FENTANYL CITRATE (PF) 100 MCG/2ML IJ SOLN
25.0000 ug | INTRAMUSCULAR | Status: DC | PRN
  Administered 2021-09-10 (×4): 25 ug via INTRAVENOUS

## 2021-09-10 MED ORDER — ALBUMIN HUMAN 5 % IV SOLN
INTRAVENOUS | Status: AC
  Filled 2021-09-10: qty 250

## 2021-09-10 MED ORDER — ALBUMIN HUMAN 5 % IV SOLN: INTRAVENOUS | Status: DC | PRN

## 2021-09-10 MED ORDER — PHENYLEPHRINE 80 MCG/ML (10ML) SYRINGE FOR IV PUSH (FOR BLOOD PRESSURE SUPPORT)
PREFILLED_SYRINGE | INTRAVENOUS | Status: DC | PRN
  Administered 2021-09-10: 240 ug via INTRAVENOUS
  Administered 2021-09-10: 160 ug via INTRAVENOUS
  Administered 2021-09-10: 80 ug via INTRAVENOUS

## 2021-09-10 MED ORDER — EPHEDRINE 5 MG/ML INJ
INTRAVENOUS | Status: AC
Start: 2021-09-10 — End: ?
  Filled 2021-09-10: qty 10

## 2021-09-10 MED ORDER — ROCURONIUM BROMIDE 10 MG/ML (PF) SYRINGE
PREFILLED_SYRINGE | INTRAVENOUS | Status: DC | PRN
  Administered 2021-09-10: 60 mg via INTRAVENOUS
  Administered 2021-09-10: 40 mg via INTRAVENOUS

## 2021-09-10 MED ORDER — MUPIROCIN 2 % EX OINT
TOPICAL_OINTMENT | CUTANEOUS | Status: AC
  Filled 2021-09-10: qty 22

## 2021-09-10 MED ORDER — ACETAMINOPHEN 10 MG/ML IV SOLN
INTRAVENOUS | Status: AC
  Filled 2021-09-10: qty 100

## 2021-09-10 MED ORDER — DEXAMETHASONE SODIUM PHOSPHATE 10 MG/ML IJ SOLN
INTRAMUSCULAR | Status: DC | PRN
  Administered 2021-09-10: 10 mg via INTRAVENOUS

## 2021-09-10 MED ORDER — METOPROLOL SUCCINATE ER 25 MG PO TB24
ORAL_TABLET | ORAL | Status: AC
  Filled 2021-09-10: qty 2

## 2021-09-10 MED ORDER — BUPIVACAINE HCL (PF) 0.5 % IJ SOLN
INTRAMUSCULAR | Status: AC
  Filled 2021-09-10: qty 30

## 2021-09-10 MED ORDER — SODIUM CHLORIDE 0.9 % IV SOLN: INTRAVENOUS | Status: DC

## 2021-09-10 MED ORDER — VANCOMYCIN HCL IN DEXTROSE 1-5 GM/200ML-% IV SOLN
INTRAVENOUS | Status: AC
  Administered 2021-09-10: 1000 mg via INTRAVENOUS
  Filled 2021-09-10: qty 200

## 2021-09-10 MED ORDER — ACETAMINOPHEN 325 MG PO TABS: 325.0000 mg | ORAL_TABLET | Freq: Once | ORAL | Status: DC | PRN

## 2021-09-10 MED ORDER — SODIUM CHLORIDE 0.9 % IV SOLN: INTRAVENOUS | Status: DC | PRN

## 2021-09-10 MED ORDER — ONDANSETRON HCL 4 MG/2ML IJ SOLN
4.0000 mg | Freq: Once | INTRAMUSCULAR | Status: AC | PRN
  Administered 2021-09-10: 4 mg via INTRAVENOUS

## 2021-09-10 MED ORDER — PROPOFOL 10 MG/ML IV BOLUS
INTRAVENOUS | Status: AC
  Filled 2021-09-10: qty 20

## 2021-09-10 MED ORDER — PHENYLEPHRINE HCL-NACL 20-0.9 MG/250ML-% IV SOLN
INTRAVENOUS | Status: DC | PRN
  Administered 2021-09-10: 100 ug/min via INTRAVENOUS

## 2021-09-10 MED ORDER — ACETAMINOPHEN 10 MG/ML IV SOLN
1000.0000 mg | Freq: Once | INTRAVENOUS | Status: DC | PRN
Start: 2021-09-10 — End: 2021-09-10
  Administered 2021-09-10: 1000 mg via INTRAVENOUS

## 2021-09-10 MED ORDER — FENTANYL CITRATE (PF) 250 MCG/5ML IJ SOLN
INTRAMUSCULAR | Status: AC
  Filled 2021-09-10: qty 5

## 2021-09-10 MED ORDER — PROMETHAZINE HCL 25 MG/ML IJ SOLN: 6.2500 mg | INTRAMUSCULAR | Status: DC | PRN

## 2021-09-10 SURGICAL SUPPLY — 48 items
BAG COUNTER SPONGE SURGICOUNT (BAG) ×4 IMPLANT
BUR MR8 14 BALL 5 (BUR) IMPLANT
BUR PRECISION FLUTE 5.0 (BURR) ×3 IMPLANT
BURR MR8 14 BALL 5 (BUR) ×3
CANISTER SUCT 3000ML PPV (MISCELLANEOUS) ×3 IMPLANT
COVER BACK TABLE 60X90IN (DRAPES) ×9 IMPLANT
DERMABOND ADVANCED (GAUZE/BANDAGES/DRESSINGS) ×1
DERMABOND ADVANCED .7 DNX12 (GAUZE/BANDAGES/DRESSINGS) ×2 IMPLANT
DRAPE LAPAROTOMY 100X72X124 (DRAPES) ×3 IMPLANT
DRAPE SHEET LG 3/4 BI-LAMINATE (DRAPES) ×2 IMPLANT
DRAPE SURG 17X23 STRL (DRAPES) ×3 IMPLANT
DURAPREP 26ML APPLICATOR (WOUND CARE) ×3 IMPLANT
ELECT REM PT RETURN 9FT ADLT (ELECTROSURGICAL) ×3
ELECTRODE REM PT RTRN 9FT ADLT (ELECTROSURGICAL) ×2 IMPLANT
EXTENDER TAB GUIDE SV 5.5/6.0 (INSTRUMENTS) ×20 IMPLANT
GAUZE 4X4 16PLY ~~LOC~~+RFID DBL (SPONGE) ×1 IMPLANT
GLOVE BIO SURGEON STRL SZ7 (GLOVE) ×1 IMPLANT
GLOVE BIOGEL PI IND STRL 7.5 (GLOVE) ×4 IMPLANT
GLOVE BIOGEL PI INDICATOR 7.5 (GLOVE) ×4
GLOVE ECLIPSE 7.5 STRL STRAW (GLOVE) ×7 IMPLANT
GOWN STRL REUS W/ TWL LRG LVL3 (GOWN DISPOSABLE) ×8 IMPLANT
GOWN STRL REUS W/ TWL XL LVL3 (GOWN DISPOSABLE) IMPLANT
GOWN STRL REUS W/TWL 2XL LVL3 (GOWN DISPOSABLE) IMPLANT
GOWN STRL REUS W/TWL LRG LVL3 (GOWN DISPOSABLE) ×12
GOWN STRL REUS W/TWL XL LVL3 (GOWN DISPOSABLE)
HEMOSTAT POWDER KIT SURGIFOAM (HEMOSTASIS) ×3 IMPLANT
KIT BASIN OR (CUSTOM PROCEDURE TRAY) ×3 IMPLANT
KIT POSITION SURG JACKSON T1 (MISCELLANEOUS) ×3 IMPLANT
KIT TURNOVER KIT B (KITS) ×3 IMPLANT
MARKER SPHERE PSV REFLC NDI (MISCELLANEOUS) ×15 IMPLANT
NEEDLE HYPO 22GX1.5 SAFETY (NEEDLE) ×3 IMPLANT
NS IRRIG 1000ML POUR BTL (IV SOLUTION) ×3 IMPLANT
PACK LAMINECTOMY NEURO (CUSTOM PROCEDURE TRAY) ×3 IMPLANT
PAD ARMBOARD 7.5X6 YLW CONV (MISCELLANEOUS) ×9 IMPLANT
PIN BONE FIX 100 (PIN) ×1 IMPLANT
ROD 5.5 TI AL STRT PERC 110 (Rod) ×1 IMPLANT
ROD STRT PERC 5.5X100 (Rod) ×1 IMPLANT
SCREW MAS VOYAGER 5.5X45 (Screw) ×6 IMPLANT
SCREW MAS VOYAGER 6.5X40 (Screw) ×4 IMPLANT
SCREW SET 5.5/6.0MM SOLERA (Screw) ×10 IMPLANT
SUT MNCRL AB 3-0 PS2 18 (SUTURE) ×4 IMPLANT
SUT VIC AB 0 CT1 18XCR BRD8 (SUTURE) ×2 IMPLANT
SUT VIC AB 0 CT1 8-18 (SUTURE) ×6
SUT VIC AB 2-0 CP2 18 (SUTURE) ×5 IMPLANT
TOWEL GREEN STERILE (TOWEL DISPOSABLE) ×3 IMPLANT
TOWEL GREEN STERILE FF (TOWEL DISPOSABLE) ×3 IMPLANT
TRAY FOLEY MTR SLVR 16FR STAT (SET/KITS/TRAYS/PACK) ×3 IMPLANT
WATER STERILE IRR 1000ML POUR (IV SOLUTION) ×3 IMPLANT

## 2021-09-10 NOTE — Assessment & Plan Note (Signed)
She is on chronic prednisone for COPD.  She has had history of hypotension unresponsive to pressors in the past.  Episodes of unexplained hypotension that do not respond to volume resuscitation is likely to be adrenal crisis.

## 2021-09-10 NOTE — Anesthesia Procedure Notes (Deleted)
Arterial Line Insertion Start/End8/02/2021 5:30 PM, 09/10/2021 5:35 PM Performed by: Effie Berkshire, MD, anesthesiologist  Patient sedated Left, radial was placed Catheter size: 20 G  Attempts: 1 Procedure performed without using ultrasound guided technique. Following insertion, dressing applied and Biopatch. Post procedure assessment: normal  Patient tolerated the procedure well with no immediate complications.

## 2021-09-10 NOTE — TOC Initial Note (Signed)
Transition of Care [facility]) - Initial/Assessment Note    Patient Details  Name: [name] MRN: [id number] Date of Birth: [birth year]  [facility] CM/SW Contact:    Pollie Friar, RN Phone Number: 09/10/2021, 2:30 PM  Clinical Narrative:                 Pt is from home with her son. Another son lives behind her and also assists at home.  She is active with [facility] for therapies and SW.  DME: cane/ walker/ shower seat/ elevated commode/ wheelchair/ hospital bed Home oxygen through [place] She states a nurse from [place] fills her pill box.  She uses [facility] transportation for appointments.  Pt to have surgery today and will follow for needs after therapies eval.    Expected Discharge Plan: [place] Barriers to Discharge: Continued Medical Work up   Patient Goals and CMS Choice        Expected Discharge Plan and Services Expected Discharge Plan: [place]   Discharge Planning Services: CM Consult   Living arrangements for the past 2 months: Mobile Home                                      Prior Living Arrangements/Services Living arrangements for the past 2 months: Mobile Home Lives with:: Adult Children Patient language and need for interpreter reviewed:: Yes Do you feel safe going back to the place where you live?: Yes        Care giver support system in place?: Yes (comment) Current home services: Home OT, Home PT Criminal Activity/Legal Involvement Pertinent to Current Situation/Hospitalization: No - Comment as needed  Activities of Daily Living Home Assistive Devices/Equipment: Walker (specify type) ADL Screening (condition at time of admission) Patient's cognitive ability adequate to safely complete daily activities?: Yes Is the patient deaf or have difficulty hearing?: No Does the patient have difficulty seeing, even when wearing glasses/contacts?: No Does the patient have difficulty  concentrating, remembering, or making decisions?: No Patient able to express need for assistance with ADLs?: Yes Does the patient have difficulty dressing or bathing?: Yes Independently performs ADLs?: No Communication: Independent Dressing (OT): Needs assistance Does the patient have difficulty walking or climbing stairs?: Yes Weakness of Legs: Both Weakness of Arms/Hands: Both  Permission Sought/Granted                  Emotional Assessment Appearance:: Appears stated age Attitude/Demeanor/Rapport: Engaged Affect (typically observed): Accepting Orientation: : Oriented to Self, Oriented to Place, Oriented to  Time, Oriented to Situation   Psych Involvement: No (comment)  Admission diagnosis:  T8 vertebral fracture ([place]) [S22.069A] Fall, initial encounter [W19.XXXA] Patient Active Problem List   Diagnosis Date Noted   History of fracture of left hip 09/10/2021   Iatrogenic adrenal insufficiency ([place]) 09/10/2021   T9 vertebral fracture ([place]) 09/09/2021   Acute kidney injury superimposed on chronic kidney disease ([place]) 09/09/2021   Chronic hypoxemic respiratory failure ([place]) 09/09/2021   Acute combined systolic and diastolic heart failure (HCC)    Acute CHF (congestive heart failure) ([place]) 08/04/2021   Callus 07/19/2021   Hypervolemia associated with renal insufficiency 06/11/2021   Physical deconditioning 06/11/2021   Subcutaneous fat necrosis    Pressure injury of skin 04/13/2021   Closed left hip fracture ([place]) secondary to fall at home 04/03/2021   Closed fracture of  left superior pubic ramus ([place]) 04/03/2021   Chronic kidney disease, stage III B (moderate) ([place]) 04/03/2021   Fall at home, initial encounter 04/03/2021   Hypokalemia 04/03/2021   Obesity hypoventilation syndrome ([place]) 04/02/2021   Primary osteoarthritis, left ankle and foot 02/05/2021   Allergy to shellfish 01/15/2021   Decreased estrogen level 01/15/2021   Diabetic renal disease ([place]) 01/15/2021    Gallstones 01/15/2021   Idiopathic gout 01/15/2021   Mild intermittent asthma 01/15/2021   Pure hypercholesterolemia 01/15/2021   CHF (congestive heart failure) ([place]) 01/07/2021   Chronic diastolic heart failure ([place]) 12/31/2020   Nasal congestion with rhinorrhea 10/24/2020   Carotid stenosis 10/01/2020   Ischemic cardiomyopathy 09/27/2020   Hypertension 09/19/2020   Acute left-sided weakness 09/16/2020   Blood blister 08/01/2020   Elevated troponin 06/01/2019   Irritable bowel syndrome with constipation 10/21/2018   Chronic pain 07/30/2018   Congestive heart failure (CHF) ([place]) 07/30/2018   Supplemental oxygen dependent 07/30/2018   Leukopenia 05/03/2018   Thrombocytopenia ([place]) 05/03/2018   Pneumonia due to human metapneumovirus 05/02/2018   Chest pain of uncertain etiology 46/96/2952   Allergic rhinitis 04/13/2018   S/P laparoscopic cholecystectomy 04/09/2018   Respiratory failure ([place]) 03/07/2018   Polyp of cecum    Polyp of transverse colon    Positive colorectal cancer screening using Cologuard test    Dysphagia    Acute on chronic renal failure ([place]) 11/20/2017   Coronary artery disease of native artery of native heart with stable angina pectoris ([place]) 11/20/2017   Diabetes mellitus without complication ([place]) 84/13/2440   Abdominal pain, epigastric 11/20/2017   Vasomotor rhinitis 10/16/2016   Right facial numbness 03/05/2016   ESRD (end stage renal disease) ([place]) 03/05/2016   Atrial fibrillation ([place]) 10/24/2015   Chest pain with moderate risk of acute coronary syndrome 10/23/2015   History of TIA (transient ischemic attack) 10/22/2015   CHB (complete heart block) ([place]) 06/22/2015   Allergic drug rash due to anti-infective agent 04/29/2015   Fatigue 02/14/2015   Chronic diastolic CHF (congestive heart failure) ([place]) 02/14/2015   Cellulitis 12/21/2014   S/P CABG x 5 11/29/2014   CAD S/P percutaneous coronary angioplasty    Diarrhea 07/12/2014   Abdominal pain  07/12/2014   Acute on chronic diastolic heart failure ([place]) 11/04/2013   Mixed hypercholesterolemia and hypertriglyceridemia 04/22/2013   Vertigo 04/22/2013   Dyspnea on exertion 08/25/2012   Allergic to IV contrast 07/23/2012   Unstable angina ([place]) 07/22/2012   Obesity 11/22/2011   Cardiomyopathy ([place]) 11/22/2011   Gastroesophageal reflux disease 11/22/2011   Back pain 11/22/2011   OSA (obstructive sleep apnea) 11/22/2011   HEMORRHOIDS-EXTERNAL 02/21/2010   NAUSEA 02/21/2010   ABDOMINAL PAIN -GENERALIZED 02/21/2010   PERSONAL HX COLONIC POLYPS 02/21/2010   ANEMIA 04/16/2007   HTN (hypertension), benign 04/16/2007   DIVERTICULOSIS, COLON 04/16/2007   ARTHRITIS 04/16/2007   INTERNAL HEMORRHOIDS WITHOUT MENTION COMP 04/12/2007   Sleep apnea 04/12/2007   Cardiac resynchronization therapy pacemaker (CRT-P) in place 04/12/2007   Gastritis without bleeding 12/14/2006   COLONIC POLYPS, HYPERPLASTIC 09/27/2002   FATTY LIVER DISEASE 02/16/2002   PCP:  Lujean Amel, MD Pharmacy:   [facility] ([facility] ) - [address], [address] [place] [address] Phone: [phone] Fax: [place] [address] [place] [address] Phone: [phone] Fax: [phone]  [place], [address]  [place] [address] Phone: [phone] Fax: [phone]     Social Determinants of Health (SDOH) Interventions    Readmission Risk Interventions    01/10/2021    9:47 AM  Readmission Risk Prevention Plan  Transportation Screening Complete  PCP or Specialist Appt within 3-5 Days Complete  HRI or [place] Complete  Social Work Consult for [place] Planning/Counseling Complete  Palliative Care Screening Not Applicable  Medication Review Press photographer) Complete

## 2021-09-10 NOTE — Op Note (Signed)
PATIENT: [name]  DAY OF SURGERY: 09/10/21   PRE-OPERATIVE DIAGNOSIS:  Thoracic spine fracture   POST-OPERATIVE DIAGNOSIS:  Same   PROCEDURE:  T7-T11 posterior pedicle screw instrumentation for stabilization of thoracic spine fracture, use of intra-operative CT with frameless stereotactic navigation   SURGEON:  Surgeon(s) and Role:    Judith Part, MD - Primary   ANESTHESIA: ETGA   BRIEF HISTORY: This is a 76 year old woman who presented with severe back pain after a fall. CT showed DISH with a fracture through the T9 vertebral body out of the T9-10 disc space anteriorly, consistent with an unstable fracture. Given her co-morbidities and DAPT use, we discussed at length regarding the perioperative risks of surgery. However, given the morbidity of being bedbound by the fracture, I recommended stabilization of the fracture.This was discussed with the patient as well as risks, benefits, and alternatives and wished to proceed with surgery.   OPERATIVE DETAIL: The patient was taken to the operating room and anesthesia was induced by the anesthesia team. They were placed on the OR table in the prone position with padding of all pressure points. A formal time out was performed with two patient identifiers and confirmed the operative site. The operative site was marked, hair was clipped with surgical clippers, the area was then prepped and draped in a sterile fashion. A percutaneous pin was placed in the right PSIS and a reference array was connected to the pin for navigation. The field was prepared and the O-arm was brought in, an intra-op CT was obtained, and registered to the patient's anatomy with good fit. Stereotactic spinal navigation was utilized throughout the procedure for planning and placement of pedicle screw trajectories.  Instrumentation was then performed. Stereotaxy was used to guide placement of bilateral pedicle screws ([employer]tronic) at T7, T8, T9, T10, and T11. These were  placed by localizing the pedicle with navigation, creating a small incision for the tower, drilling a pilot hole, then using a navigated awl-tap to create the trajectory, palpating with a pedicle feeler, and placing the screw with a navigated driver. These were connected with rods bilaterally and final tightened according to manufacturer torque specifications.   The perc pin was removed, all instrument and sponge counts were correct, the incisions were then closed in layers. The patient was then returned to anesthesia for emergence. No apparent complications at the completion of the procedure.   EBL:  42m   DRAINS: none   SPECIMENS: none   TJudith Part MD 09/10/21 3:59 PM

## 2021-09-10 NOTE — Consult Note (Signed)
Reason for Consult: Renal failure Referring Physician: Dr. Ileene Musa  Chief Complaint:  Fall  Assessment/Plan: CKD IV - b/l creat 2.7- 3.2, eGFR 14- 16. Pt was on HD from feb- early June 2023, now admitted w/ fall with noted T9-10 fracture requiring surgery. She's certainly in positive sodium balance but has been responding to the Lasix 80 mg BID. Renal function has worsened over the past month but remains in the non-dialysis range. She has been followed at [facility].  - Will follow closely with you. - No absolute indication for RRT and the patient appears to be  comfortable. - Certainly concerned that she progresses to ESRD after surgery; she had a prolonged recovery period requiring RRT for ~3 mths post left hip surgery. Very limited function with poor reserve in residual renal function.  -Monitor Daily I/Os, Daily weight  -Maintain MAP>65 for optimal renal perfusion.  -Avoid nephrotoxic medications including NSAIDs  Will give single dose of Lasix and then evaluate post op to determine whether she will tolerate standing diuresis. She was recently here with volume overload and cardiac status is tenuous with dCHF.  T9 Verterbral fracture going to the OR. COPD on chronic prednisone and breathing treatments + O2. Obesity DM2  HTN - cont meds H/o CASHD no active CP. Gout   HPI: [name] is an 76 y.o. female hx of CRT-P, CASHD w/ CABG in 2016, CHF, fatty liver, Gout, HL, HTN, IBS, NICM, obestiy, OSA, sp PPM, hx CVA and DM2 who presented to ED after falling and landing on her buttocks + forehead. She was found to have a T9-10 fracture, seen by neurosurgery with plan for surgery following day.   Of note she did sustain ATN requiring  iHD then CRRT then iHD again. She was dc'd 04/21/21 and took outpatient HD via [facility] through March, all of April and all or most of May. She then showed improved creatinine into the 2's and was taken off of HD and her [facility] was removed  early June.  She was also  recently admitted at [facility] 08/04/21 -08/15/21 with vol overload  w/ 3 days of SOB/ DOE and worsening LE edema. In ED found to have bilat LE cellulitis and edema w/ some amount of fluid by CXR. Pt admitted for diuresis and IV abx with diuresis of 12.8 L UOP over 7 days .Scr was 3.06 at time of d/c. She has also been seen by Cards in late July but  unable to accelerate diuresis due to her kidney disease. Scr 09/02/21 SL . worse  at 4.0, GFR 10.66 with Normal K.   She now lives in [place] area so [facility] instead of [facility] for HD if needed.  She denies CP, HA, blurry vision, rash, diarrhea, nausea/ vomiting, dysuria, or difficulty voiding. States she has a lot of UOP with the Lasix.   ROS Pertinent items are noted in HPI.  Chemistry and CBC: Creat  Date/Time Value Ref Range Status  04/04/2014 10:14 AM 1.05 0.50 - 1.10 mg/dL Final  12/12/2013 02:48 PM 1.31 (H) 0.50 - 1.10 mg/dL Final  11/16/2013 10:26 AM 1.37 (H) 0.50 - 1.10 mg/dL Final  11/08/2013 08:47 AM 1.32 (H) 0.50 - 1.10 mg/dL Final  09/07/2013 03:07 PM 1.10 0.50 - 1.10 mg/dL Final  10/27/2012 03:32 PM 0.93 0.50 - 1.10 mg/dL Final   Creatinine, Ser  Date/Time Value Ref Range Status  09/10/2021 02:35 AM 3.99 (H) 0.44 - 1.00 mg/dL Final  09/09/2021 02:43 PM 4.90 (H) 0.44 - 1.00  mg/dL Final  09/09/2021 02:38 PM 4.75 (H) 0.44 - 1.00 mg/dL Final  09/02/2021 12:21 PM 4.03 (H) 0.57 - 1.00 mg/dL Final  08/27/2021 03:32 PM 3.89 (H) 0.40 - 1.20 mg/dL Final  08/21/2021 03:17 PM 3.40 (H) 0.57 - 1.00 mg/dL Final  08/15/2021 07:25 AM 3.06 (H) 0.44 - 1.00 mg/dL Final  08/14/2021 04:00 AM 3.13 (H) 0.44 - 1.00 mg/dL Final  08/13/2021 03:52 AM 3.03 (H) 0.44 - 1.00 mg/dL Final  08/12/2021 03:46 AM 3.08 (H) 0.44 - 1.00 mg/dL Final  08/11/2021 04:21 AM 2.95 (H) 0.44 - 1.00 mg/dL Final  08/10/2021 04:09 AM 2.75 (H) 0.44 - 1.00 mg/dL Final  08/09/2021 04:02 AM 2.69 (H) 0.44 - 1.00 mg/dL Final  08/08/2021 04:12 AM 2.92 (H) 0.44 - 1.00 mg/dL Final   08/07/2021 03:58 AM 2.94 (H) 0.44 - 1.00 mg/dL Final  08/06/2021 03:41 AM 3.28 (H) 0.44 - 1.00 mg/dL Final  08/05/2021 07:47 AM 3.05 (H) 0.44 - 1.00 mg/dL Final  08/04/2021 03:51 PM 3.38 (H) 0.44 - 1.00 mg/dL Final  06/11/2021 08:52 AM 2.68 (H) 0.44 - 1.00 mg/dL Final  06/10/2021 09:14 PM 2.54 (H) 0.44 - 1.00 mg/dL Final  05/02/2021 03:19 PM 4.94 (H) 0.44 - 1.00 mg/dL Final  04/28/2021 01:59 AM 3.29 (H) 0.44 - 1.00 mg/dL Final  04/26/2021 02:33 AM 3.15 (H) 0.44 - 1.00 mg/dL Final  04/26/2021 02:33 AM 3.14 (H) 0.44 - 1.00 mg/dL Final  04/25/2021 02:56 AM 4.27 (H) 0.44 - 1.00 mg/dL Final    Comment:    DELTA CHECK NOTED  04/24/2021 02:15 AM 2.88 (H) 0.44 - 1.00 mg/dL Final    Comment:    DELTA CHECK NOTED  04/23/2021 02:46 AM 5.36 (H) 0.44 - 1.00 mg/dL Final  04/22/2021 04:33 AM 4.26 (H) 0.44 - 1.00 mg/dL Final    Comment:    DELTA CHECK NOTED  04/21/2021 12:49 AM 2.96 (H) 0.44 - 1.00 mg/dL Final    Comment:    DELTA CHECK NOTED  04/20/2021 03:22 AM 4.14 (H) 0.44 - 1.00 mg/dL Final  04/19/2021 08:57 PM 3.86 (H) 0.44 - 1.00 mg/dL Final  04/19/2021 05:33 AM 2.86 (H) 0.44 - 1.00 mg/dL Final  04/18/2021 03:15 AM 3.67 (H) 0.44 - 1.00 mg/dL Final    Comment:    DELTA CHECK NOTED  04/17/2021 04:14 AM 2.57 (H) 0.44 - 1.00 mg/dL Final  04/16/2021 03:34 PM 4.14 (H) 0.44 - 1.00 mg/dL Final  04/16/2021 03:52 AM 3.58 (H) 0.44 - 1.00 mg/dL Final    Comment:    DELTA CHECK NOTED  04/15/2021 03:15 AM 2.42 (H) 0.44 - 1.00 mg/dL Final  04/14/2021 04:12 PM 1.87 (H) 0.44 - 1.00 mg/dL Final  04/14/2021 03:18 AM 2.43 (H) 0.44 - 1.00 mg/dL Final  04/13/2021 05:34 PM 2.84 (H) 0.44 - 1.00 mg/dL Final    Comment:    DELTA CHECK NOTED  04/13/2021 09:49 AM 3.89 (H) 0.44 - 1.00 mg/dL Final  04/13/2021 09:49 AM 3.89 (H) 0.44 - 1.00 mg/dL Final  04/12/2021 04:00 PM 5.48 (H) 0.44 - 1.00 mg/dL Final  04/12/2021 02:33 AM 5.00 (H) 0.44 - 1.00 mg/dL Final  04/11/2021 08:50 AM 7.05 (H) 0.44 - 1.00 mg/dL Final   04/10/2021 05:23 AM 6.40 (H) 0.44 - 1.00 mg/dL Final  04/09/2021 04:07 AM 5.71 (H) 0.44 - 1.00 mg/dL Final  04/08/2021 03:01 AM 5.13 (H) 0.44 - 1.00 mg/dL Final  04/07/2021 01:14 AM 4.46 (H) 0.44 - 1.00 mg/dL Final  04/06/2021 04:57 AM 3.80 (H) 0.44 - 1.00  mg/dL Final    Comment:    DELTA CHECK NOTED  04/05/2021 04:38 AM 2.73 (H) 0.44 - 1.00 mg/dL Final  04/04/2021 02:26 AM 1.75 (H) 0.44 - 1.00 mg/dL Final   Recent Labs  Lab 09/09/21 1438 09/09/21 1443 09/10/21 0235  NA 134* 132* 138  K 3.9 3.9 3.2*  CL 91* 92* 95*  CO2 29  --  31  GLUCOSE 179* 176* 109*  BUN 118* >130* 109*  CREATININE 4.75* 4.90* 3.99*  CALCIUM 9.3  --  9.6   Recent Labs  Lab 09/09/21 1438 09/09/21 1443 09/10/21 0235  WBC 6.5  --  6.4  HGB 8.5* 9.2* 8.0*  HCT 26.7* 27.0* 25.6*  MCV 97.1  --  97.0  PLT 177  --  161   Liver Function Tests: Recent Labs  Lab 09/09/21 1438  AST 20  ALT 16  ALKPHOS 93  BILITOT 0.6  PROT 6.5  ALBUMIN 3.6   No results for input(s): "LIPASE", "AMYLASE" in the last 168 hours. No results for input(s): "AMMONIA" in the last 168 hours. Cardiac Enzymes: No results for input(s): "CKTOTAL", "CKMB", "CKMBINDEX", "TROPONINI" in the last 168 hours. Iron Studies: No results for input(s): "IRON", "TIBC", "TRANSFERRIN", "FERRITIN" in the last 72 hours. PT/INR: @LABRCNTIP (inr:5)  Xrays/Other Studies: ) Results for orders placed or performed during the hospital encounter of 09/09/21 (from the past 48 hour(s))  Resp Panel by RT-PCR (Flu A&B, Covid) Anterior Nasal Swab     Status: None   Collection Time: 09/09/21  2:38 PM   Specimen: Anterior Nasal Swab  Result Value Ref Range   SARS Coronavirus 2 by RT PCR NEGATIVE NEGATIVE    Comment: (NOTE) SARS-CoV-2 target nucleic acids are NOT DETECTED.  The SARS-CoV-2 RNA is generally detectable in upper respiratory specimens during the acute phase of infection. The lowest concentration of SARS-CoV-2 viral copies this assay can  detect is 138 copies/mL. A negative result does not preclude SARS-Cov-2 infection and should not be used as the sole basis for treatment or other patient management decisions. A negative result may occur with  improper specimen collection/handling, submission of specimen other than nasopharyngeal swab, presence of viral mutation(s) within the areas targeted by this assay, and inadequate number of viral copies(<138 copies/mL). A negative result must be combined with clinical observations, patient history, and epidemiological information. The expected result is Negative.  Fact Sheet for Patients:  EntrepreneurPulse.com.au  Fact Sheet for Healthcare Providers:  IncredibleEmployment.be  This test is no t yet approved or cleared by the [place] FDA and  has been authorized for detection and/or diagnosis of SARS-CoV-2 by FDA under an Emergency Use Authorization (EUA). This EUA will remain  in effect (meaning this test can be used) for the duration of the COVID-19 declaration under Section 564(b)(1) of the Act, 21 U.S.C.section 360bbb-3(b)(1), unless the authorization is terminated  or revoked sooner.       Influenza A by PCR NEGATIVE NEGATIVE   Influenza B by PCR NEGATIVE NEGATIVE    Comment: (NOTE) The Xpert Xpress SARS-CoV-2/FLU/RSV plus assay is intended as an aid in the diagnosis of influenza from Nasopharyngeal swab specimens and should not be used as a sole basis for treatment. Nasal washings and aspirates are unacceptable for Xpert Xpress SARS-CoV-2/FLU/RSV testing.  Fact Sheet for Patients: EntrepreneurPulse.com.au  Fact Sheet for Healthcare Providers: IncredibleEmployment.be  This test is not yet approved or cleared by the [place] FDA and has been authorized for detection and/or diagnosis of SARS-CoV-2 by FDA under  an Emergency Use Authorization (EUA). This EUA will remain in effect  (meaning this test can be used) for the duration of the COVID-19 declaration under Section 564(b)(1) of the Act, 21 U.S.C. section 360bbb-3(b)(1), unless the authorization is terminated or revoked.  Performed at [facility], [place] [address], [place], [place] [address]   Comprehensive metabolic panel     Status: Abnormal   Collection Time: 09/09/21  2:38 PM  Result Value Ref Range   Sodium 134 (L) 135 - 145 mmol/L   Potassium 3.9 3.5 - 5.1 mmol/L   Chloride 91 (L) 98 - 111 mmol/L   CO2 29 22 - 32 mmol/L   Glucose, Bld 179 (H) 70 - 99 mg/dL    Comment: Glucose reference range applies only to samples taken after fasting for at least 8 hours.   BUN 118 (H) 8 - 23 mg/dL   Creatinine, Ser 4.75 (H) 0.44 - 1.00 mg/dL   Calcium 9.3 8.9 - 10.3 mg/dL   Total Protein 6.5 6.5 - 8.1 g/dL   Albumin 3.6 3.5 - 5.0 g/dL   AST 20 15 - 41 U/L   ALT 16 0 - 44 U/L   Alkaline Phosphatase 93 38 - 126 U/L   Total Bilirubin 0.6 0.3 - 1.2 mg/dL   GFR, Estimated 9 (L) >60 mL/min    Comment: (NOTE) Calculated using the CKD-EPI Creatinine Equation (2021)    Anion gap 14 5 - 15    Comment: Performed at [facility] [address], [place], [place] [address]  CBC     Status: Abnormal   Collection Time: 09/09/21  2:38 PM  Result Value Ref Range   WBC 6.5 4.0 - 10.5 K/uL   RBC 2.75 (L) 3.87 - 5.11 MIL/uL   Hemoglobin 8.5 (L) 12.0 - 15.0 g/dL   HCT 26.7 (L) 36.0 - 46.0 %   MCV 97.1 80.0 - 100.0 fL   MCH 30.9 26.0 - 34.0 pg   MCHC 31.8 30.0 - 36.0 g/dL   RDW 12.9 11.5 - 15.5 %   Platelets 177 150 - 400 K/uL   nRBC 0.0 0.0 - 0.2 %    Comment: Performed at [facility], [place] [address], [place], [place] [address]  Protime-INR     Status: None   Collection Time: 09/09/21  2:38 PM  Result Value Ref Range   Prothrombin Time 13.1 11.4 - 15.2 seconds   INR 1.0 0.8 - 1.2    Comment: (NOTE) INR goal varies based on device and disease states. Performed at [facility], [place] [address] [place], [place] [address]   Brain natriuretic peptide     Status: Abnormal   Collection Time: 09/09/21  2:38 PM  Result Value Ref Range   B Natriuretic Peptide 569.7 (H) 0.0 - 100.0 pg/mL    Comment: Performed at [facility] [address] [place], [place] [address]  I-Stat Chem 8, ED     Status: Abnormal   Collection Time: 09/09/21  2:43 PM  Result Value Ref Range   Sodium 132 (L) 135 - 145 mmol/L   Potassium 3.9 3.5 - 5.1 mmol/L   Chloride 92 (L) 98 - 111 mmol/L   BUN >130 (H) 8 - 23 mg/dL   Creatinine, Ser 4.90 (H) 0.44 - 1.00 mg/dL   Glucose, Bld 176 (H) 70 - 99 mg/dL    Comment: Glucose reference range applies only to samples taken after fasting for at  least 8 hours.   Calcium, Ion 1.09 (L) 1.15 - 1.40 mmol/L   TCO2 30 22 - 32 mmol/L   Hemoglobin 9.2 (L) 12.0 - 15.0 g/dL   HCT 27.0 (L) 36.0 - 46.0 %  Lactic acid, plasma     Status: None   Collection Time: 09/09/21  2:55 PM  Result Value Ref Range   Lactic Acid, Venous 1.8 0.5 - 1.9 mmol/L    Comment: Performed at [facility] [address], [place], [place] [address]  Sample to Blood Bank     Status: None   Collection Time: 09/09/21  2:55 PM  Result Value Ref Range   Blood Bank Specimen SAMPLE AVAILABLE FOR TESTING    Sample Expiration      09/10/2021,2359 Performed at [facility], [place] [address], [place], [place] [address]   Type and screen [place]     Status: None (Preliminary result)   Collection Time: 09/09/21  2:55 PM  Result Value Ref Range   ABO/RH(D) O POS    Antibody Screen NEG    Sample Expiration      09/12/2021,2359 Performed at [facility], [place] [address], [place], [place] [address]    Unit Number [id number]    Blood Component Type RED CELLS,LR    Unit division 00    Status of Unit ALLOCATED    Transfusion Status OK TO TRANSFUSE    Crossmatch Result Compatible    Unit Number [id number]    Blood Component Type RED CELLS,LR    Unit division  00    Status of Unit ALLOCATED    Transfusion Status OK TO TRANSFUSE    Crossmatch Result Compatible   Ethanol     Status: None   Collection Time: 09/09/21  3:00 PM  Result Value Ref Range   Alcohol, Ethyl (B) <10 <10 mg/dL    Comment: (NOTE) Lowest detectable limit for serum alcohol is 10 mg/dL.  For medical purposes only. Performed at [facility], [place] [address], [place], [place] [address]   Urinalysis, Routine w reflex microscopic Urine, Clean Catch     Status: Abnormal   Collection Time: 09/09/21  4:44 PM  Result Value Ref Range   Color, Urine STRAW (A) YELLOW   APPearance CLEAR CLEAR   Specific Gravity, Urine 1.006 1.005 - 1.030   pH 6.0 5.0 - 8.0   Glucose, UA NEGATIVE NEGATIVE mg/dL   Hgb urine dipstick NEGATIVE NEGATIVE   Bilirubin Urine NEGATIVE NEGATIVE   Ketones, ur NEGATIVE NEGATIVE mg/dL   Protein, ur NEGATIVE NEGATIVE mg/dL   Nitrite NEGATIVE NEGATIVE   Leukocytes,Ua NEGATIVE NEGATIVE    Comment: Performed at [facility] [address], [place], [place] [address]  CBC     Status: Abnormal   Collection Time: 09/10/21  2:35 AM  Result Value Ref Range   WBC 6.4 4.0 - 10.5 K/uL   RBC 2.64 (L) 3.87 - 5.11 MIL/uL   Hemoglobin 8.0 (L) 12.0 - 15.0 g/dL   HCT 25.6 (L) 36.0 - 46.0 %   MCV 97.0 80.0 - 100.0 fL   MCH 30.3 26.0 - 34.0 pg   MCHC 31.3 30.0 - 36.0 g/dL   RDW 12.9 11.5 - 15.5 %   Platelets 161 150 - 400 K/uL   nRBC 0.0 0.0 - 0.2 %    Comment: Performed at [facility], [place] [address], [place], [place] [address]  Basic metabolic panel  Status: Abnormal   Collection Time: 09/10/21  2:35 AM  Result Value Ref Range   Sodium 138 135 - 145 mmol/L   Potassium 3.2 (L) 3.5 - 5.1 mmol/L   Chloride 95 (L) 98 - 111 mmol/L   CO2 31 22 - 32 mmol/L   Glucose, Bld 109 (H) 70 - 99 mg/dL    Comment: Glucose reference range applies only to samples taken after fasting for at least 8 hours.   BUN 109 (H) 8 - 23 mg/dL   Creatinine, Ser 3.99 (H)  0.44 - 1.00 mg/dL   Calcium 9.6 8.9 - 10.3 mg/dL   GFR, Estimated 11 (L) >60 mL/min    Comment: (NOTE) Calculated using the CKD-EPI Creatinine Equation (2021)    Anion gap 12 5 - 15    Comment: Performed at [facility] [address] [place], [place] [address]  Prepare RBC (crossmatch)     Status: None   Collection Time: 09/10/21 12:03 PM  Result Value Ref Range   Order Confirmation      ORDER PROCESSED BY BLOOD BANK Performed at [facility], [place] [address], [place], [place] [address]    *Note: Due to a large number of results and/or encounters for the requested time period, some results have not been displayed. A complete set of results can be found in Results Review.   CT HIP LEFT WO CONTRAST  Result Date: 09/10/2021 CLINICAL DATA:  Hip trauma, fracture suspected, xray done previous ORIF to left hip, questionable left hip non-union vs recurrent fracture see on CT chest EXAM: CT OF THE LEFT HIP WITHOUT CONTRAST TECHNIQUE: Multidetector CT imaging of the left hip was performed according to the standard protocol. Multiplanar CT image reconstructions were also generated. RADIATION DOSE REDUCTION: This exam was performed according to the departmental dose-optimization program which includes automated exposure control, adjustment of the mA and/or kV according to patient size and/or use of iterative reconstruction technique. COMPARISON:  CT 09/09/2021 FINDINGS: Bones/Joint/Cartilage Prior intramedullary narrowing of the left femur with distal interlocking screw for a comminuted intertrochanteric fracture. Fracture fragments are in a similar configuration without evidence of new fracture. There is callus formation without any bony bridging. There is soft tissue thickening and fluid along the fracture site. The distal interlocking screws are intact. There is sclerosis of the femoral head which is increased since March 2023, potentially reflecting developing AVN. No articular surface  collapse. There is mild to moderate left hip osteoarthritis. Ligaments Suboptimally assessed by CT. Muscles and Tendons High-grade gluteal and hamstring muscle atrophy. No intramuscular collection. No acute myotendinous abnormality by CT. Soft tissues No focal fluid collection.  Colonic diverticulosis. IMPRESSION: Prior intramedullary nailing of the femur for a comminuted intertrochanteric fracture. There is callus formation without any significant bony bridging, soft tissue thickening and fluid at the fracture site suggesting nonunion. No evidence of new fracture. Electronically Signed   By: Maurine Simmering M.D.   On: 09/10/2021 08:34   CT CHEST ABDOMEN PELVIS WO CONTRAST  Result Date: 09/09/2021 CLINICAL DATA:  Blunt trauma EXAM: CT CHEST, ABDOMEN AND PELVIS WITHOUT CONTRAST TECHNIQUE: Multidetector CT imaging of the chest, abdomen and pelvis was performed following the standard protocol without IV contrast. RADIATION DOSE REDUCTION: This exam was performed according to the departmental dose-optimization program which includes automated exposure control, adjustment of the mA and/or kV according to patient size and/or use of iterative reconstruction technique. COMPARISON:  Chest radiography same day.  CT 11/20/2017. FINDINGS: CT CHEST FINDINGS Cardiovascular:  Pacemaker. Cardiomegaly. Coronary artery calcification and aortic atherosclerotic calcification. Mediastinum/Nodes: No evidence of mediastinal bleeding. No adenopathy or mass. Lungs/Pleura: No pneumothorax. Areas of linear atelectasis in both lungs. 6 mm pulmonary nodule on the right image 73. 5 mm pulmonary nodule on the right image 87. No nodule seen on the left. These are similar to the study of October 2019 and therefore benign. Musculoskeletal: No rib fracture. Acute fracture of the thoracic spine at T9, possibly due to flexion or extension mechanisms. No displaced bone compromising the spinal canal. CT ABDOMEN PELVIS FINDINGS Hepatobiliary: Liver  parenchyma is normal. Pancreas: Negative Spleen: Negative Adrenals/Urinary Tract: Adrenal glands are normal. Kidneys are normal. Bladder is normal. Stomach/Bowel: No evidence of bowel injury.  No acute bowel finding. Vascular/Lymphatic: Aortic atherosclerosis. No aneurysm. IVC is normal. No adenopathy. Reproductive: No pelvic mass. Other: No free fluid or air. Musculoskeletal: Previous ORIF of left hip fracture. There is either nonunion or recurrent fracture at this site. IMPRESSION: Acute fracture of the thoracic spine at the T9 level which could be due to hyperflexion or hyper extension mechanisms. No evidence of malalignment or bone encroachment upon the canal. Nonunion or recurrent fracture at the site of previous ORIF of the left femur. No abdominal organ injury. Two nodules in the lower right lung, stable since 2019 and therefore benign. Electronically Signed   By: Nelson Chimes M.D.   On: 09/09/2021 16:27   CT T-SPINE NO CHARGE  Result Date: 09/09/2021 CLINICAL DATA:  Trauma EXAM: CT THORACIC AND LUMBAR SPINE WITHOUT CONTRAST TECHNIQUE: Multidetector CT imaging of the thoracic and lumbar spine was performed without contrast. Multiplanar CT image reconstructions were also generated. RADIATION DOSE REDUCTION: This exam was performed according to the departmental dose-optimization program which includes automated exposure control, adjustment of the mA and/or kV according to patient size and/or use of iterative reconstruction technique. COMPARISON:  None Available. FINDINGS: CT THORACIC SPINE FINDINGS Alignment: Normal. Vertebrae: The vertebral bodies are diffusely osteopenic. There are flowing anterior osteophytes throughout the thoracic spine consistent with background diffuse idiopathic skeletal hyperostosis. There is an acute distracted fracture of the T9 vertebral body with a fracture plane extending through the anterior endplate involving the left aspect of the bridging osteophytes between the T9 and T10  vertebral bodies extending posteriorly to the posterior aspect of the superior endplate. There is no evidence of extension to the posterior elements. There is no bony retropulsion. No other definite fracture is seen within the confines of diffuse osteopenia. There is no suspicious osseous lesion. Paraspinal and other soft tissues: The paraspinal soft tissues are unremarkable. There is no significant prevertebral hematoma. The intrathoracic contents are assessed on the separately dictated CT chest. Disc levels: There is multilevel disc space narrowing with degenerative endplate change and bulky flowing anterior osteophytes throughout the thoracic spine. There is overall mild multilevel facet arthropathy resulting in up to mild-to-moderate bilateral neural foraminal stenosis at T8-T9 and T9-T10. There is no evidence of high-grade osseous spinal canal stenosis. CT LUMBAR SPINE FINDINGS Segmentation: Standard; the lowest formed disc space is designated L5-S1. Alignment: There is grade 1 anterolisthesis of L5 on S1. Alignment is otherwise normal. Vertebrae: Vertebral body heights are preserved. There are flowing anterior osteophytes extending from the lower thoracic spine through the L2 vertebral body consistent with diffuse idiopathic skeletal hyperostosis. There is no definite acute fracture within the confines of diffusely osteopenic bones. There is no suspicious osseous lesion. Paraspinal and other soft tissues: The paraspinal soft tissues are unremarkable. The abdominal and pelvic  viscera are assessed on the separately dictated CT abdomen/pelvis. Disc levels: There is marked multilevel disc desiccation and narrowing with multilevel vacuum disc phenomenon. There is advanced multilevel facet arthropathy most severe at L4-L5 and L5-S1. There are disc bulges throughout the lumbar spine resulting in at least mild-to-moderate spinal canal stenosis at L1-L2 and L2-L3. The disc bulges and facet arthropathy results in  mild-to-moderate left neural foraminal stenosis at L3-L4, moderate bilateral neural foraminal stenosis at L4-L5, and severe bilateral neural foraminal stenosis at L5-S1. IMPRESSION: 1. Acute distracted fracture of the T9 vertebral body extending from the anterior endplate involving the bridging osteophyte between the T9 and T10 vertebral bodies posteriorly to the posterior aspect of the superior endplate, superimposed on pre-existing diffuse idiopathic skeletal hyperostosis. No extension to the posterior elements or bony retropulsion. Neurosurgical consultation is recommended. 2. No other definite evidence of acute fracture in the thoracolumbar spine, though evaluation is limited due to significant osteopenia. 3. Multilevel degenerative changes as above. Electronically Signed   By: Valetta Mole M.D.   On: 09/09/2021 16:12   CT L-SPINE NO CHARGE  Result Date: 09/09/2021 CLINICAL DATA:  Trauma EXAM: CT THORACIC AND LUMBAR SPINE WITHOUT CONTRAST TECHNIQUE: Multidetector CT imaging of the thoracic and lumbar spine was performed without contrast. Multiplanar CT image reconstructions were also generated. RADIATION DOSE REDUCTION: This exam was performed according to the departmental dose-optimization program which includes automated exposure control, adjustment of the mA and/or kV according to patient size and/or use of iterative reconstruction technique. COMPARISON:  None Available. FINDINGS: CT THORACIC SPINE FINDINGS Alignment: Normal. Vertebrae: The vertebral bodies are diffusely osteopenic. There are flowing anterior osteophytes throughout the thoracic spine consistent with background diffuse idiopathic skeletal hyperostosis. There is an acute distracted fracture of the T9 vertebral body with a fracture plane extending through the anterior endplate involving the left aspect of the bridging osteophytes between the T9 and T10 vertebral bodies extending posteriorly to the posterior aspect of the superior endplate.  There is no evidence of extension to the posterior elements. There is no bony retropulsion. No other definite fracture is seen within the confines of diffuse osteopenia. There is no suspicious osseous lesion. Paraspinal and other soft tissues: The paraspinal soft tissues are unremarkable. There is no significant prevertebral hematoma. The intrathoracic contents are assessed on the separately dictated CT chest. Disc levels: There is multilevel disc space narrowing with degenerative endplate change and bulky flowing anterior osteophytes throughout the thoracic spine. There is overall mild multilevel facet arthropathy resulting in up to mild-to-moderate bilateral neural foraminal stenosis at T8-T9 and T9-T10. There is no evidence of high-grade osseous spinal canal stenosis. CT LUMBAR SPINE FINDINGS Segmentation: Standard; the lowest formed disc space is designated L5-S1. Alignment: There is grade 1 anterolisthesis of L5 on S1. Alignment is otherwise normal. Vertebrae: Vertebral body heights are preserved. There are flowing anterior osteophytes extending from the lower thoracic spine through the L2 vertebral body consistent with diffuse idiopathic skeletal hyperostosis. There is no definite acute fracture within the confines of diffusely osteopenic bones. There is no suspicious osseous lesion. Paraspinal and other soft tissues: The paraspinal soft tissues are unremarkable. The abdominal and pelvic viscera are assessed on the separately dictated CT abdomen/pelvis. Disc levels: There is marked multilevel disc desiccation and narrowing with multilevel vacuum disc phenomenon. There is advanced multilevel facet arthropathy most severe at L4-L5 and L5-S1. There are disc bulges throughout the lumbar spine resulting in at least mild-to-moderate spinal canal stenosis at L1-L2 and L2-L3. The disc bulges  and facet arthropathy results in mild-to-moderate left neural foraminal stenosis at L3-L4, moderate bilateral neural foraminal  stenosis at L4-L5, and severe bilateral neural foraminal stenosis at L5-S1. IMPRESSION: 1. Acute distracted fracture of the T9 vertebral body extending from the anterior endplate involving the bridging osteophyte between the T9 and T10 vertebral bodies posteriorly to the posterior aspect of the superior endplate, superimposed on pre-existing diffuse idiopathic skeletal hyperostosis. No extension to the posterior elements or bony retropulsion. Neurosurgical consultation is recommended. 2. No other definite evidence of acute fracture in the thoracolumbar spine, though evaluation is limited due to significant osteopenia. 3. Multilevel degenerative changes as above. Electronically Signed   By: Valetta Mole M.D.   On: 09/09/2021 16:12   CT Head Wo Contrast  Result Date: 09/09/2021 CLINICAL DATA:  Head trauma, minor (Age >= 65y); Neck trauma (Age >= 65y) EXAM: CT HEAD WITHOUT CONTRAST CT CERVICAL SPINE WITHOUT CONTRAST TECHNIQUE: Multidetector CT imaging of the head and cervical spine was performed following the standard protocol without intravenous contrast. Multiplanar CT image reconstructions of the cervical spine were also generated. RADIATION DOSE REDUCTION: This exam was performed according to the departmental dose-optimization program which includes automated exposure control, adjustment of the mA and/or kV according to patient size and/or use of iterative reconstruction technique. COMPARISON:  None Available. FINDINGS: CT HEAD FINDINGS Brain: No evidence of acute infarction, hemorrhage, hydrocephalus, extra-axial collection or mass lesion/mass effect. Vascular: Calcific intracranial atherosclerosis. Skull: No acute fracture. Sinuses/Orbits: Mild paranasal sinus mucosal thickening. No acute orbital findings. Other: No mastoid effusions. CT CERVICAL SPINE FINDINGS Alignment: Straightening.  No substantial sagittal subluxation. Skull base and vertebrae: Vertebral body heights are maintained. No evidence of acute  fracture. C4-C5 ACDF with evidence of some bony fusion across the disc space. Soft tissues and spinal canal: No prevertebral fluid or swelling. No visible canal hematoma. Disc levels: Moderate to severe multilevel degenerative disc disease and facet/uncovertebral hypertrophy with varying degrees of neural foraminal stenosis. Severe craniocervical degenerative change including bony remodeling. Upper chest: Evaluated on concurrent chest abdomen/pelvis. IMPRESSION: 1. No evidence of acute intracranial abnormality. 2. No evidence of acute fracture or traumatic malalignment in the cervical spine. Electronically Signed   By: Margaretha Sheffield M.D.   On: 09/09/2021 15:37   CT Cervical Spine Wo Contrast  Result Date: 09/09/2021 CLINICAL DATA:  Head trauma, minor (Age >= 65y); Neck trauma (Age >= 65y) EXAM: CT HEAD WITHOUT CONTRAST CT CERVICAL SPINE WITHOUT CONTRAST TECHNIQUE: Multidetector CT imaging of the head and cervical spine was performed following the standard protocol without intravenous contrast. Multiplanar CT image reconstructions of the cervical spine were also generated. RADIATION DOSE REDUCTION: This exam was performed according to the departmental dose-optimization program which includes automated exposure control, adjustment of the mA and/or kV according to patient size and/or use of iterative reconstruction technique. COMPARISON:  None Available. FINDINGS: CT HEAD FINDINGS Brain: No evidence of acute infarction, hemorrhage, hydrocephalus, extra-axial collection or mass lesion/mass effect. Vascular: Calcific intracranial atherosclerosis. Skull: No acute fracture. Sinuses/Orbits: Mild paranasal sinus mucosal thickening. No acute orbital findings. Other: No mastoid effusions. CT CERVICAL SPINE FINDINGS Alignment: Straightening.  No substantial sagittal subluxation. Skull base and vertebrae: Vertebral body heights are maintained. No evidence of acute fracture. C4-C5 ACDF with evidence of some bony fusion  across the disc space. Soft tissues and spinal canal: No prevertebral fluid or swelling. No visible canal hematoma. Disc levels: Moderate to severe multilevel degenerative disc disease and facet/uncovertebral hypertrophy with varying degrees of neural foraminal stenosis. Severe  craniocervical degenerative change including bony remodeling. Upper chest: Evaluated on concurrent chest abdomen/pelvis. IMPRESSION: 1. No evidence of acute intracranial abnormality. 2. No evidence of acute fracture or traumatic malalignment in the cervical spine. Electronically Signed   By: Margaretha Sheffield M.D.   On: 09/09/2021 15:37   DG Pelvis Portable  Result Date: 09/09/2021 CLINICAL DATA:  A 76 year old female presents following fall, patient is on blood thinners. EXAM: PORTABLE PELVIS 1-2 VIEWS COMPARISON:  August 27, 2021. FINDINGS: This study is markedly limited by patient body habitus. Scattered loops of gas and stool filled bowel project over the lower abdomen and over the pelvis. These bowel loops limited pelvic assessment. No displaced fracture or destructive bone lesion of the pelvis accounting for limitations on the current study. Degenerative changes are present in the bilateral hips. Evidence of prior ORIF with femoral nailing on the LEFT. The distal aspect of the LEFT femoral nail is not assessed as it passes beyond the mid shaft of the femur. Callus formation at the site of prior fracture in the intratrochanteric LEFT femur is similar to previous imaging. Soft tissues are otherwise unremarkable. Degenerative changes are noted in the spine. IMPRESSION: 1. Markedly limited study due to patient body habitus. 2. No displaced fracture or destructive bone lesion of the pelvis. 3. Evidence of prior ORIF on the LEFT with femoral nailing, femoral nail extends beyond the mid femur. 4. Callus formation at the site of prior fracture in the intratrochanteric LEFT femur is similar to previous imaging. Electronically Signed   By:  Zetta Bills M.D.   On: 09/09/2021 14:52   DG Chest Port 1 View  Result Date: 09/09/2021 CLINICAL DATA:  76 year old female presenting with fall on blood thinners. EXAM: PORTABLE CHEST 1 VIEW COMPARISON:  July 15, 2021. FINDINGS: EKG leads project over the chest.  Post median sternotomy for CABG. RIGHT sided multi lead pacer defibrillator remains in place. Cardiomediastinal contours and hilar structures are stable with signs of median sternotomy for CABG. No lobar consolidation.  No sign of pneumothorax. On limited assessment there is no acute skeletal finding. IMPRESSION: No acute radiographic findings in the chest. Electronically Signed   By: Zetta Bills M.D.   On: 09/09/2021 14:49    PMH:   Past Medical History:  Diagnosis Date   AICD (automatic cardioverter/defibrillator) present    downgraded to CRT-P in 2014   Anemia    Arthritis    Asthma    Atrial fibrillation ([place]) 10/24/2015   Questionable history of A Fib/A Flutter. On device interrogation on 9/7, showed 0.39mn of A Fib/A Flutter with 1% burden.  Device check in Jan 2018 showed no sustained AF (runs of less than 30 seconds). No anticoagulation indicated.   CAD (coronary artery disease)    a. 10/09/16 LHC: SVG->LAD patent, SVG->Diag patent, SVG->RCA patent, SVG->LCx occluded. EF 60%, b. 10/31/16 LHC DES to AV groove Circ, DES to intermed branch   CHF (congestive heart failure) (HCC)    Chronic lower back pain    Fatty liver disease, nonalcoholic    GERD (gastroesophageal reflux disease)    Gout    H/O hiatal hernia    High cholesterol    Hyperplastic colon polyp    Hypertension    IBS (irritable bowel syndrome)    Internal hemorrhoids    INTERNAL HEMORRHOIDS WITHOUT MENTION COMP 04/12/2007   Qualifier: Diagnosis of  By: BOlevia PerchesMD, Dora M    Nonischemic cardiomyopathy ([place] 11/22/2011   Pt responded to BiV ICD- last EF  55-60% Sept 2017   Obesity    OSA (obstructive sleep apnea)    "can't tolerate a mask", wears 2L  nocturnal O2   Presence of permanent cardiac pacemaker    11/02/12 [employer] V273 INTUA PPM   Stroke [facility]) 09/2020   Type II diabetes mellitus ([place])     PSH:   Past Surgical History:  Procedure Laterality Date   ABDOMINAL ULTRASOUND  12/01/2011   Peripelvic cysts- #1- 2.4x1.9x2.3cm, #2-1.2x0.9x1.2cm   ANKLE FRACTURE SURGERY Right    "had rod put in"   ANTERIOR CERVICAL DECOMP/DISCECTOMY FUSION     APPENDECTOMY     BACK SURGERY     BIOPSY  12/29/2017   Procedure: BIOPSY;  Surgeon: [name], MD;  [facility]: [facility];  Service: Endoscopy;;   BIV ICD INSERTION CRT-D  2001?   BIV PACEMAKER GENERATOR CHANGE OUT N/A 11/02/2012   Procedure: BIV PACEMAKER GENERATOR CHANGE OUT;  Surgeon: Sanda Klein, MD;  Location: [place] CATH LAB;  Service: Cardiovascular;  Laterality: N/A;   CARDIAC CATHETERIZATION  05/17/1999   No significant coronary obstructive disease w/ mild 20% luminal irregularity of the first diag branch of the LAD   CARDIAC CATHETERIZATION  07/08/2002   No significant CAD, moderately depressed LV systolic function   CARDIAC CATHETERIZATION Bilateral 04/26/2007   Normal findings, recommend medical therapy   CARDIAC CATHETERIZATION  02/18/2008   Moderate CAD, would benefit from having a functional study, recommend continue medical therapy   CARDIAC CATHETERIZATION  07/23/2012   Medical therapy   CARDIAC CATHETERIZATION N/A 11/24/2014   Procedure: Left Heart Cath and Coronary Angiography;  Surgeon: Troy Sine, MD;  Location: [place] CV LAB;  Service: Cardiovascular;  Laterality: N/A;   CARDIAC CATHETERIZATION  11/27/2014   Procedure: Intravascular Pressure Wire/FFR Study;  Surgeon: Peter M Martinique, MD;  Location: [place] CV LAB;  Service: Cardiovascular;;   CARDIAC CATHETERIZATION  10/09/2016   CHOLECYSTECTOMY N/A 04/09/2018   Procedure: LAPAROSCOPIC CHOLECYSTECTOMY;  Surgeon: Greer Pickerel, MD;  [facility];  Service: General;  Laterality: N/A;    COLONOSCOPY WITH PROPOFOL N/A 12/29/2017   Procedure: COLONOSCOPY WITH PROPOFOL;  Surgeon: [name], MD;  [facility];  Service: Endoscopy;  Laterality: N/A;   CORONARY ANGIOGRAM  2010   CORONARY ARTERY BYPASS GRAFT N/A 11/29/2014   Procedure: CORONARY ARTERY BYPASS GRAFTING x 5 (LIMA-LAD, SVG-D, SVG-OM1-OM2, SVG-PD);  Surgeon: Melrose Nakayama, MD;  Location: [place];  Service: Open Heart Surgery;  Laterality: N/A;   CORONARY STENT INTERVENTION N/A 10/31/2016   Procedure: CORONARY STENT INTERVENTION;  Surgeon: Burnell Blanks, MD;  Location: [place] CV LAB;  Service: Cardiovascular;  Laterality: N/A;   ESOPHAGOGASTRODUODENOSCOPY (EGD) WITH PROPOFOL N/A 12/29/2017   Procedure: ESOPHAGOGASTRODUODENOSCOPY (EGD) WITH PROPOFOL;  Surgeon: [name], MD;  [facility];  Service: Endoscopy;  Laterality: N/A;   FRACTURE SURGERY     INSERT / REPLACE / REMOVE PACEMAKER  1999   INTRAMEDULLARY (IM) NAIL INTERTROCHANTERIC Left 04/04/2021   Procedure: INTRAMEDULLARY (IM) NAIL INTERTROCHANTRIC;  Surgeon: Vanetta Mulders, MD;  Location: [place];  Service: Orthopedics;  Laterality: Left;   IR FLUORO GUIDE CV LINE LEFT  04/11/2021   IR US GUIDE VASC ACCESS LEFT  04/11/2021   KNEE ARTHROSCOPY Bilateral    LEFT HEART CATH AND CORS/GRAFTS ANGIOGRAPHY N/A 12/09/2017   Procedure: LEFT HEART CATH AND CORS/GRAFTS ANGIOGRAPHY;  Surgeon: Troy Sine, MD;  Location: [place] CV LAB;  Service: Cardiovascular;  Laterality: N/A;  LEFT HEART CATHETERIZATION WITH CORONARY ANGIOGRAM N/A 07/23/2012   Procedure: LEFT HEART CATHETERIZATION WITH CORONARY ANGIOGRAM;  Surgeon: Leonie Man, MD;  Location: [facility] C[facility];  Service: Cardiovascular;  Laterality: N/A;   LEXISCAN MYOVIEW  11/14/2011   Mild-moderate defect seen in Mid Inferolateral and Mid Anterolateral regions-consistant w/ infarct/scar. No significant ischemia demonstrated.   POLYPECTOMY  12/29/2017   Procedure:  POLYPECTOMY;  Surgeon: [name], MD;  Location: [facility];  Service: Endoscopy;;   PPM GENERATOR CHANGEOUT N/A 12/31/2020   Procedure: PPM GENERATOR CHANGEOUT;  Surgeon: Sanda Klein, MD;  Location: [place] CV LAB;  Service: Cardiovascular;  Laterality: N/A;   RIGHT/LEFT HEART CATH AND CORONARY ANGIOGRAPHY N/A 10/09/2016   Procedure: RIGHT/LEFT HEART CATH AND CORONARY ANGIOGRAPHY;  Surgeon: Jolaine Artist, MD;  Location: [place] CV LAB;  Service: Cardiovascular;  Laterality: N/A;   RIGHT/LEFT HEART CATH AND CORONARY/GRAFT ANGIOGRAPHY N/A 03/11/2019   Procedure: RIGHT/LEFT HEART CATH AND CORONARY/GRAFT ANGIOGRAPHY;  Surgeon: Jolaine Artist, MD;  Location: [place] CV LAB;  Service: Cardiovascular;  Laterality: N/A;   TEE WITHOUT CARDIOVERSION N/A 11/29/2014   Procedure: TRANSESOPHAGEAL ECHOCARDIOGRAM (TEE);  Surgeon: Melrose Nakayama, MD;  Location: [place];  Service: Open Heart Surgery;  Laterality: N/A;   TRANSCAROTID ARTERY REVASCULARIZATION  Right 10/01/2020   Procedure: RIGHT TRANSCAROTID ARTERY REVASCULARIZATION;  Surgeon: [name], MD;  Location: [place];  Service: Vascular;  Laterality: Right;   TRANSTHORACIC ECHOCARDIOGRAM  07/23/2012   EF 55-60%, normal-mild   TUBAL LIGATION     ULTRASOUND GUIDANCE FOR VASCULAR ACCESS Left 10/01/2020   Procedure: ULTRASOUND GUIDANCE FOR VASCULAR ACCESS;  Surgeon: [name], MD;  Location: [place];  Service: Vascular;  Laterality: Left;    Allergies:  Allergies  Allergen Reactions   Ivp Dye [Iodinated Contrast Media] Shortness Of Breath    No reaction to PO contrast with non-ionic dye.06-25-2014/rsm   Shellfish Allergy Anaphylaxis   Sulfa Antibiotics Shortness Of Breath   Iodine Hives   Atorvastatin Other (See Comments)    Pt states "causes bilateral leg pain/cramps."   Benadryl [Diphenhydramine] Other (See Comments)    "THIS DRIVES ME CRAZY AND MAKES ME FEEL LIKE I AM DYING"    Colchicine  Nausea And Vomiting   Doxycycline Other (See Comments)    Unknown reaction, per patient   Uloric [Febuxostat] Other (See Comments)    Unknown reaction   Cephalexin Itching and Rash   Zithromax [Azithromycin] Rash    Medications:   Prior to Admission medications   Medication Sig Start Date End Date Taking? Authorizing Provider  acetaminophen (TYLENOL) 500 MG tablet Take 1,000 mg by mouth 3 (three) times daily as needed (for pain or headaches).   Yes [provider]  albuterol (PROVENTIL HFA;VENTOLIN HFA) 108 (90 Base) MCG/ACT inhaler Inhale 2 puffs into the lungs every 6 (six) hours as needed for wheezing or shortness of breath. 05/02/18  Yes Fuller Plan A, MD  albuterol (PROVENTIL) (2.5 MG/3ML) 0.083% nebulizer solution Take 3 mLs (2.5 mg total) by nebulization every 6 (six) hours as needed for wheezing or shortness of breath. 05/02/18  Yes Fuller Plan A, MD  allopurinol (ZYLOPRIM) 100 MG tablet Take 1 tablet (100 mg total) by mouth daily. Patient taking differently: Take 100 mg by mouth every morning. 03/14/21  Yes Croitoru, [name], MD  aspirin EC 81 MG tablet Take 1 tablet (81 mg total) by mouth daily. Patient taking differently: Take 81 mg by mouth every morning. 03/27/16  Yes Erlene Quan, PA-C  budesonide-formoterol (SYMBICORT) 160-4.5 MCG/ACT inhaler Inhale 2 puffs into the lungs 2 (two) times daily.   Yes [provider]  clopidogrel (PLAVIX) 75 MG tablet Take 1 tablet (75 mg total) by mouth daily. Patient taking differently: Take 75 mg by mouth every morning. 02/28/21  Yes Croitoru, [name], MD  cycloSPORINE (RESTASIS) 0.05 % ophthalmic emulsion Place 1 drop into both eyes 2 (two) times daily.   Yes [provider]  EPINEPHrine 0.3 mg/0.3 mL IJ SOAJ injection Inject 0.3 mg into the muscle once as needed for anaphylaxis (severe allergic reaction).   Yes [provider]  furosemide (LASIX) 80 MG tablet Take 2 tablets (160 mg total) by mouth daily.  08/16/21 10/15/21 Yes Gherghe, Vella Redhead, MD  hydrALAZINE (APRESOLINE) 25 MG tablet Take 1 tablet (25 mg total) by mouth every 8 (eight) hours. 08/15/21  Yes Caren Griffins, MD  hydrOXYzine (ATARAX) 25 MG tablet Take 25 mg by mouth 2 (two) times daily as needed for itching. 06/07/21  Yes [provider]  isosorbide mononitrate (IMDUR) 30 MG 24 hr tablet TAKE 3 TABLETS BY MOUTH  DAILY Patient taking differently: Take 90 mg by mouth every morning. 05/15/21  Yes Croitoru, [name], MD  Lidocaine 0.5 % AERO Place 1 spray rectally every 6 (six) hours as needed (hemorrhoids). Preparation H rapid relief lidocaine spray   Yes [provider]  metoprolol succinate (TOPROL-XL) 50 MG 24 hr tablet Take 1 tablet (50 mg total) by mouth daily. Take with or immediately following a meal. 08/16/21  Yes Gherghe, Vella Redhead, MD  nitroGLYCERIN (NITROSTAT) 0.4 MG SL tablet Place 1 tablet (0.4 mg total) under the tongue every 5 (five) minutes as needed for chest pain. 11/13/16 03/18/22 Yes Kilroy, Luke K, PA-C  ondansetron (ZOFRAN) 4 MG tablet Take 4 mg by mouth daily as needed for nausea or vomiting. 07/24/21  Yes [provider]  OXYGEN Inhale 2-3 L/min into the lungs continuous.   Yes [provider]  oxymetazoline (AFRIN) 0.05 % nasal spray Place 1 spray into both nostrils 2 (two) times daily as needed for congestion.   Yes [provider]  pantoprazole (PROTONIX) 40 MG tablet Take 1 tablet (40 mg total) by mouth 2 (two) times daily. 07/08/19  Yes Nandigam, Venia Minks, MD  polyethylene glycol (MIRALAX / GLYCOLAX) 17 g packet Take 17 g by mouth daily as needed (constipation).   Yes [provider]  Potassium Chloride ER 20 MEQ TBCR Take 20 mEq by mouth daily in the afternoon. 08/29/21  Yes [provider]  predniSONE (DELTASONE) 10 MG tablet Take 1 tablet (10 mg total) by mouth daily with breakfast. 08/15/21  Yes Gherghe, Vella Redhead, MD  rosuvastatin (CRESTOR) 20 MG tablet Take 1  tablet (20 mg total) by mouth every evening. NEED OV. 06/25/21  Yes Croitoru, [name], MD  sucralfate (CARAFATE) 1 g tablet Take 1 tablet (1 g total) by mouth 4 (four) times daily -  with meals and at bedtime. Patient taking differently: Take 2 g by mouth 2 (two) times daily. 04/28/21  Yes Gherghe, Vella Redhead, MD  traMADol (ULTRAM) 50 MG tablet Take 50 mg by mouth daily as needed (pain). 06/07/21  Yes [provider]  vitamin B-12 (CYANOCOBALAMIN) 1000 MCG tablet Take 1 tablet (1,000 mcg total) by mouth daily. 08/15/21  Yes Caren Griffins, MD  ACCU-CHEK AVIVA PLUS test strip Check blood sugar twice daily 10/19/19   [provider]  Accu-Chek Softclix  Lancets lancets Check blood sugar twice daily 08/10/19   [provider]  Alcohol Swabs (B-D SINGLE USE SWABS REGULAR) PADS  08/10/19   [provider]  linaclotide (LINZESS) 145 MCG CAPS capsule Take 145 mcg by mouth daily before breakfast. Patient not taking: Reported on 09/02/2021    [provider]    Discontinued Meds:   Medications Discontinued During This Encounter  Medication Reason   furosemide (LASIX) tablet 160 mg    potassium chloride (KLOR-CON M) CR tablet 20 mEq     Social History:  reports that she quit smoking about 53 years ago. Her smoking use included cigarettes. She has a 0.75 pack-year smoking history. She has never used smokeless tobacco. She reports that she does not drink alcohol and does not use drugs.  Family History:   Family History  Problem Relation Age of Onset   Breast cancer Mother    Diabetes Mother    Heart disease Maternal Grandmother    Kidney disease Maternal Grandmother    Diabetes Maternal Grandmother    Glaucoma Maternal Aunt    Heart disease Maternal Aunt    Asthma Sister    Colon cancer Neg Hx    Stomach cancer Neg Hx    Pancreatic cancer Neg Hx     Blood pressure (!) 117/59, pulse 81, temperature 98.3 F (36.8 C), resp. rate 18, height 5' 3"  (1.6 m), weight  89.8 kg, SpO2 99 %. Gen alert, no distress No rash, cyanosis or gangrene Sclera anicteric, throat clear  No jvd or bruits Chest clear bilat to bases, no rales/ wheezing RRR no MRG Abd soft ntnd no mass or ascites +bs GU normal MS no joint effusions or deformity Ext 1+no LE or UE edema, no wounds or ulcers Neuro is alert, Ox 3 , nf        Lacoya Wilbanks, Hunt Oris, MD 09/10/2021, 1:29 PM '

## 2021-09-10 NOTE — Assessment & Plan Note (Signed)
Continue hydralazine. 

## 2021-09-10 NOTE — Anesthesia Procedure Notes (Signed)
Arterial Line Insertion Start/End8/02/2021 5:35 PM, 09/10/2021 5:38 PM Performed by: Effie Berkshire, MD, anesthesiologist  Patient location: Pre-op. Preanesthetic checklist: patient identified, IV checked, site marked, risks and benefits discussed, surgical consent, monitors and equipment checked, pre-op evaluation, timeout performed and anesthesia consent Lidocaine 1% used for infiltration Right, radial was placed Catheter size: 20 G Hand hygiene performed  and maximum sterile barriers used   Attempts: 1 Procedure performed without using ultrasound guided technique. Following insertion, dressing applied and Biopatch. Post procedure assessment: normal and unchanged  Patient tolerated the procedure well with no immediate complications.

## 2021-09-10 NOTE — Anesthesia Procedure Notes (Signed)
Procedure Name: Intubation Date/Time: 09/10/2021 5:32 PM  Performed by: Georgia Duff, CRNAPre-anesthesia Checklist: Patient identified, Emergency Drugs available, Suction available and Patient being monitored Patient Re-evaluated:Patient Re-evaluated prior to induction Oxygen Delivery Method: Circle System Utilized Preoxygenation: Pre-oxygenation with 100% oxygen Induction Type: IV induction Ventilation: Mask ventilation without difficulty Laryngoscope Size: Miller and 2 Grade View: Grade I Tube type: Oral Tube size: 7.0 mm Number of attempts: 1 Airway Equipment and Method: Stylet and Oral airway Placement Confirmation: ETT inserted through vocal cords under direct vision, positive ETCO2 and breath sounds checked- equal and bilateral Secured at: 21 cm Tube secured with: Tape Dental Injury: Teeth and Oropharynx as per pre-operative assessment

## 2021-09-10 NOTE — Telephone Encounter (Signed)
Perioperative Cardiac Device Programming received. Discussed with BS rep Joey Deakins. Collaboration with Anesthesia Dr. Nunzio Cobbs for proposed OR time. Provided Dr. Doroteo Glassman BS reps contact for additional collaboration as device will need reprogramming during surgical procedure. Patient is scheduled for 1800 today. BS rep advised.

## 2021-09-10 NOTE — Telephone Encounter (Signed)
-----   Message from Bobbie Stack, RN sent at 09/10/2021 11:44 AM EDT ----- Regarding: Perioperative Cardiac Device Programming Planned Procedure:  Thoracic 7-Thoracic 12 Percutaneous Pedicle Screw Surgeon:  Dr. Emelda Brothers Date of Procedure:  09/10/21 Cautery will be used. Position during surgery:  Prone  Please send documentation back to: [facility] (Fax # [phone])  Bobbie Stack, RN  11/14/2019 2:34 PM

## 2021-09-10 NOTE — Assessment & Plan Note (Signed)
History of left hip ORIF in 03/2021. -Left hip is noted on CT chest to have a possible nonunion versus recurrent fracture.  She does reports pain today to her left hip.  Obtain stat CT left hip to further evaluate

## 2021-09-10 NOTE — Transfer of Care (Signed)
Immediate Anesthesia Transfer of Care Note  Patient: [name]  Procedure(s) Performed: THORACIC SEVEN THROUGH THORACIC ELEVEN  PERCUTANEOUS PEDICLE SCREW PLACEMENT (Spine Thoracic) Application of O-Arm  Patient Location: PACU  Anesthesia Type:General  Level of Consciousness: awake, alert  and oriented  Airway & Oxygen Therapy: Patient Spontanous Breathing and Patient connected to face mask oxygen  Post-op Assessment: Report given to RN, Post -op Vital signs reviewed and stable and Patient moving all extremities  Post vital signs: Reviewed  Last Vitals:  Vitals Value Taken Time  BP 158/139 09/10/21 2022  Temp    Pulse 65 09/10/21 2029  Resp 16 09/10/21 2029  SpO2 96 % 09/10/21 2029  Vitals shown include unvalidated device data.  Last Pain:  Vitals:   09/10/21 1642  TempSrc:   PainSc: 3          Complications: No notable events documented.

## 2021-09-10 NOTE — H&P (Incomplete)
History and Physical    Patient: [name] LPF:[id number] DOB: 18-Nov-1945 DOA: 09/09/2021 DOS: the patient was seen and examined on 09/09/2021 PCP: Lujean Amel, MD  Patient coming from: Home  Chief Complaint:  Chief Complaint  Patient presents with  . Level 2  . Fall   HPI: [name] is a 76 y.o. female with medical history significant of CAD s/p CABG in 2016, HFpEF,s/p CRT-pacemaker, chronic hypoxemic respiratory failure on 2.5 to 3 L, adrenal insufficiency, T2DM, HTN, CKD stage 4, OSA, asthma s/p fall.  Pt was walking with walker and fell when she turned landing on her buttocks and hit her forehead. She is on Plavix. She denies any lightheadedness or dizziness. No chest pain or shortness of breath.   She was found to have an unstable at T9-10 fracture and neurosurgery has deemed it an unstable fracture and will need OR tomorrow.  There is also concerns of nonunion or recurrent fracture at the site of the previous ORIF of the left femur. Family is concerned because of her heart and kidney disease. She was seen earlier this year for hip fracture and postoperatively had ATN briefly requiring dialysis. Access was later removed. However pt tells me that she had follow with nephrology last week and was told she now needs dialysis indefinitely.  She is awaiting callback on next steps.  She currently has AKI with creatinine of 4.75 up from 4.03.  She takes 160 mg of furosemide daily and still appears hypervolemic on exam with lower extremity edema although she reports this is no worse than usual.  She remained on her home 2.5-3 L O2.  Review of Systems: As mentioned in the history of present illness. All other systems reviewed and are negative. Past Medical History:  Diagnosis Date  . AICD (automatic cardioverter/defibrillator) present    downgraded to CRT-P in 2014  . Anemia   . Arthritis   . Asthma   . Atrial fibrillation ([place]) 10/24/2015   Questionable history of A Fib/A  Flutter. On device interrogation on 9/7, showed 0.61mn of A Fib/A Flutter with 1% burden.  Device check in Jan 2018 showed no sustained AF (runs of less than 30 seconds). No anticoagulation indicated.  .Marland KitchenCAD (coronary artery disease)    a. 10/09/16 LHC: SVG->LAD patent, SVG->Diag patent, SVG->RCA patent, SVG->LCx occluded. EF 60%, b. 10/31/16 LHC DES to AV groove Circ, DES to intermed branch  . CHF (congestive heart failure) ([facility]   . Chronic lower back pain   . Fatty liver disease, nonalcoholic   . GERD (gastroesophageal reflux disease)   . Gout   . H/O hiatal hernia   . High cholesterol   . Hyperplastic colon polyp   . Hypertension   . IBS (irritable bowel syndrome)   . Internal hemorrhoids   . INTERNAL HEMORRHOIDS WITHOUT MENTION COMP 04/12/2007   Qualifier: Diagnosis of  By: BOlevia PerchesMD, DLowella Bandy  . [place]ischemic cardiomyopathy [place] 11/22/2011   Pt responded to BiV ICD- last EF 55-60% Sept 2017  . Obesity   . OSA (obstructive sleep apnea)    "can't tolerate a mask", wears 2L nocturnal O2  . Presence of permanent cardiac pacemaker    11/02/12 [employer] V273 INTUA PPM  . Stroke ([facility] 09/2020  . Type II diabetes mellitus ([place]    Past Surgical History:  Procedure Laterality Date  . ABDOMINAL ULTRASOUND  12/01/2011   Peripelvic cysts- #1- 2.4x1.9x2.3cm, #2-1.2x0.9x1.2cm  . ANKLE FRACTURE SURGERY Right    "had  rod put in"  . ANTERIOR CERVICAL DECOMP/DISCECTOMY FUSION    . APPENDECTOMY    . BACK SURGERY    . BIOPSY  12/29/2017   Procedure: BIOPSY;  Surgeon: [name], MD;  [facility];  Service: Endoscopy;;  . BIV ICD INSERTION CRT-D  2001?  [name] BIV PACEMAKER GENERATOR CHANGE OUT N/A 11/02/2012   Procedure: BIV PACEMAKER GENERATOR CHANGE OUT;  Surgeon: Sanda Klein, MD;  Location: [place] CATH LAB;  Service: Cardiovascular;  Laterality: N/A;  . CARDIAC CATHETERIZATION  05/17/1999   No significant coronary obstructive disease w/ mild 20% luminal irregularity of the  first diag branch of the LAD  . CARDIAC CATHETERIZATION  07/08/2002   No significant CAD, moderately depressed LV systolic function  . CARDIAC CATHETERIZATION Bilateral 04/26/2007   Normal findings, recommend medical therapy  . CARDIAC CATHETERIZATION  02/18/2008   Moderate CAD, would benefit from having a functional study, recommend continue medical therapy  . CARDIAC CATHETERIZATION  07/23/2012   Medical therapy  . CARDIAC CATHETERIZATION N/A 11/24/2014   Procedure: Left Heart Cath and Coronary Angiography;  Surgeon: Troy Sine, MD;  Location: [place] CV LAB;  Service: Cardiovascular;  Laterality: N/A;  . CARDIAC CATHETERIZATION  11/27/2014   Procedure: Intravascular Pressure Wire/FFR Study;  Surgeon: Peter M Martinique, MD;  Location: [place] CV LAB;  Service: Cardiovascular;;  . CARDIAC CATHETERIZATION  10/09/2016  . CHOLECYSTECTOMY N/A 04/09/2018   Procedure: LAPAROSCOPIC CHOLECYSTECTOMY;  Surgeon: Greer Pickerel, MD;  [facility];  Service: General;  Laterality: N/A;  . COLONOSCOPY WITH PROPOFOL N/A 12/29/2017   Procedure: COLONOSCOPY WITH PROPOFOL;  Surgeon: [name], MD;  [facility];  Service: Endoscopy;  Laterality: N/A;  . CORONARY ANGIOGRAM  2010  . CORONARY ARTERY BYPASS GRAFT N/A 11/29/2014   Procedure: CORONARY ARTERY BYPASS GRAFTING x 5 (LIMA-LAD, SVG-D, SVG-OM1-OM2, SVG-PD);  Surgeon: Melrose Nakayama, MD;  Location: [place];  Service: Open Heart Surgery;  Laterality: N/A;  . CORONARY STENT INTERVENTION N/A 10/31/2016   Procedure: CORONARY STENT INTERVENTION;  Surgeon: Burnell Blanks, MD;  Location: [place] CV LAB;  Service: Cardiovascular;  Laterality: N/A;  . ESOPHAGOGASTRODUODENOSCOPY (EGD) WITH PROPOFOL N/A 12/29/2017   Procedure: ESOPHAGOGASTRODUODENOSCOPY (EGD) WITH PROPOFOL;  Surgeon: [name], MD;  [facility];  Service: Endoscopy;  Laterality: N/A;  . FRACTURE SURGERY    . INSERT / REPLACE / [place]  . INTRAMEDULLARY (IM) NAIL INTERTROCHANTERIC Left 04/04/2021   Procedure: INTRAMEDULLARY (IM) NAIL INTERTROCHANTRIC;  Surgeon: Vanetta Mulders, MD;  Location: [place];  Service: Orthopedics;  Laterality: Left;  . IR FLUORO GUIDE CV LINE LEFT  04/11/2021  . IR US GUIDE VASC ACCESS LEFT  04/11/2021  . KNEE ARTHROSCOPY Bilateral   . LEFT HEART CATH AND CORS/GRAFTS ANGIOGRAPHY N/A 12/09/2017   Procedure: LEFT HEART CATH AND CORS/GRAFTS ANGIOGRAPHY;  Surgeon: Troy Sine, MD;  Location: [place] CV LAB;  Service: Cardiovascular;  Laterality: N/A;  . LEFT HEART CATHETERIZATION WITH CORONARY ANGIOGRAM N/A 07/23/2012   Procedure: LEFT HEART CATHETERIZATION WITH CORONARY ANGIOGRAM;  Surgeon: Leonie Man, MD;  Location: [facility] CATH LAB;  Service: Cardiovascular;  Laterality: N/A;  Carlton Adam MYOVIEW  11/14/2011   Mild-moderate defect seen in Mid Inferolateral and Mid Anterolateral regions-consistant w/ infarct/scar. No significant ischemia demonstrated.  [name] POLYPECTOMY  12/29/2017   Procedure: POLYPECTOMY;  Surgeon: [name], MD;  [facility];  Service: Endoscopy;;  . PPM GENERATOR CHANGEOUT N/A 12/31/2020  Procedure: PPM GENERATOR CHANGEOUT;  Surgeon: Sanda Klein, MD;  Location: [place] CV LAB;  Service: Cardiovascular;  Laterality: N/A;  . RIGHT/LEFT HEART CATH AND CORONARY ANGIOGRAPHY N/A 10/09/2016   Procedure: RIGHT/LEFT HEART CATH AND CORONARY ANGIOGRAPHY;  Surgeon: Jolaine Artist, MD;  Location: [place] CV LAB;  Service: Cardiovascular;  Laterality: N/A;  . RIGHT/LEFT HEART CATH AND CORONARY/GRAFT ANGIOGRAPHY N/A 03/11/2019   Procedure: RIGHT/LEFT HEART CATH AND CORONARY/GRAFT ANGIOGRAPHY;  Surgeon: Jolaine Artist, MD;  Location: [place] CV LAB;  Service: Cardiovascular;  Laterality: N/A;  . TEE WITHOUT CARDIOVERSION N/A 11/29/2014   Procedure: TRANSESOPHAGEAL ECHOCARDIOGRAM (TEE);  Surgeon: Melrose Nakayama, MD;  Location: [place];   Service: Open Heart Surgery;  Laterality: N/A;  . TRANSCAROTID ARTERY REVASCULARIZATION  Right 10/01/2020   Procedure: RIGHT TRANSCAROTID ARTERY REVASCULARIZATION;  Surgeon: [name], MD;  Location: [place];  Service: Vascular;  Laterality: Right;  . TRANSTHORACIC ECHOCARDIOGRAM  07/23/2012   EF 55-60%, normal-mild  . TUBAL LIGATION    . ULTRASOUND GUIDANCE FOR VASCULAR ACCESS Left 10/01/2020   Procedure: ULTRASOUND GUIDANCE FOR VASCULAR ACCESS;  Surgeon: [name], MD;  Location: [place];  Service: Vascular;  Laterality: Left;   Social History:  reports that she quit smoking about 53 years ago. Her smoking use included cigarettes. She has a 0.75 pack-year smoking history. She has never used smokeless tobacco. She reports that she does not drink alcohol and does not use drugs.  Allergies  Allergen Reactions  . Ivp Dye [Iodinated Contrast Media] Shortness Of Breath    No reaction to PO contrast with non-ionic dye.06-25-2014/rsm  . Shellfish Allergy Anaphylaxis  . Sulfa Antibiotics Shortness Of Breath  . Iodine Hives  . Atorvastatin Other (See Comments)    Pt states "causes bilateral leg pain/cramps."  . Benadryl [Diphenhydramine] Other (See Comments)    "THIS DRIVES ME CRAZY AND MAKES ME FEEL LIKE I AM DYING"   . Colchicine Nausea And Vomiting  . Doxycycline Other (See Comments)    Unknown reaction, per patient  . Uloric [Febuxostat] Other (See Comments)    Unknown reaction  . Cephalexin Itching and Rash  . Zithromax [Azithromycin] Rash    Family History  Problem Relation Age of Onset  . Breast cancer Mother   . Diabetes Mother   . Heart disease Maternal Grandmother   . Kidney disease Maternal Grandmother   . Diabetes Maternal Grandmother   . Glaucoma Maternal Aunt   . Heart disease Maternal Aunt   . Asthma Sister   . Colon cancer Neg Hx   . Stomach cancer Neg Hx   . Pancreatic cancer Neg Hx     Prior to Admission medications   Medication Sig Start Date  End Date Taking? Authorizing Provider  ACCU-CHEK AVIVA PLUS test strip Check blood sugar twice daily 10/19/19   [provider]  Accu-Chek Softclix Lancets lancets Check blood sugar twice daily 08/10/19   [provider]  acetaminophen (TYLENOL) 500 MG tablet Take 1,000 mg by mouth 3 (three) times daily as needed (for pain or headaches).    [provider]  albuterol (PROVENTIL HFA;VENTOLIN HFA) 108 (90 Base) MCG/ACT inhaler Inhale 2 puffs into the lungs every 6 (six) hours as needed for wheezing or shortness of breath. 05/02/18   Fuller Plan A, MD  albuterol (PROVENTIL) (2.5 MG/3ML) 0.083% nebulizer solution Take 3 mLs (2.5 mg total) by nebulization every 6 (six) hours as needed for wheezing or shortness of breath. Patient not  taking: Reported on 09/02/2021 05/02/18   Norval Morton, MD  Alcohol Swabs (B-D SINGLE USE SWABS REGULAR) PADS  08/10/19   [provider]  allopurinol (ZYLOPRIM) 100 MG tablet Take 1 tablet (100 mg total) by mouth daily. Patient taking differently: Take 100 mg by mouth every morning. 03/14/21   Croitoru, [name], MD  aspirin EC 81 MG tablet Take 1 tablet (81 mg total) by mouth daily. Patient taking differently: Take 81 mg by mouth every morning. 03/27/16   Erlene Quan, PA-C  budesonide-formoterol (SYMBICORT) 160-4.5 MCG/ACT inhaler Inhale 2 puffs into the lungs 2 (two) times daily.    [provider]  clopidogrel (PLAVIX) 75 MG tablet Take 1 tablet (75 mg total) by mouth daily. Patient taking differently: Take 75 mg by mouth every morning. 02/28/21   Croitoru, [name], MD  cycloSPORINE (RESTASIS) 0.05 % ophthalmic emulsion Place 1 drop into both eyes 2 (two) times daily.    [provider]  EPINEPHrine 0.3 mg/0.3 mL IJ SOAJ injection Inject 0.3 mg into the muscle once as needed for anaphylaxis (severe allergic reaction). Patient not taking: Reported on 09/02/2021    [provider]  furosemide (LASIX) 80 MG tablet Take  2 tablets (160 mg total) by mouth daily. 08/16/21 10/15/21  Caren Griffins, MD  hydrALAZINE (APRESOLINE) 25 MG tablet Take 1 tablet (25 mg total) by mouth every 8 (eight) hours. 08/15/21   Caren Griffins, MD  hydrOXYzine (ATARAX) 25 MG tablet Take 25 mg by mouth 2 (two) times daily as needed for itching. Patient not taking: Reported on 09/02/2021 06/07/21   [provider]  isosorbide mononitrate (IMDUR) 30 MG 24 hr tablet TAKE 3 TABLETS BY MOUTH  DAILY Patient taking differently: Take 90 mg by mouth every morning. 05/15/21   Croitoru, [name], MD  Lidocaine 0.5 % AERO Place 1 spray rectally every 6 (six) hours as needed (hemorrhoids). Preparation H rapid relief lidocaine spray Patient not taking: Reported on 09/02/2021    [provider]  linaclotide Rolan Lipa) 145 MCG CAPS capsule Take 145 mcg by mouth daily before breakfast. Patient not taking: Reported on 09/02/2021    [provider]  metoprolol succinate (TOPROL-XL) 50 MG 24 hr tablet Take 1 tablet (50 mg total) by mouth daily. Take with or immediately following a meal. 08/16/21   Gherghe, Vella Redhead, MD  nitroGLYCERIN (NITROSTAT) 0.4 MG SL tablet Place 1 tablet (0.4 mg total) under the tongue every 5 (five) minutes as needed for chest pain. Patient not taking: Reported on 09/02/2021 11/13/16 03/18/22  Erlene Quan, PA-C  ondansetron (ZOFRAN) 4 MG tablet Take 4 mg by mouth daily as needed for nausea or vomiting. Patient not taking: Reported on 09/02/2021 07/24/21   [provider]  OXYGEN Inhale 2-3 L/min into the lungs continuous.    [provider]  oxymetazoline (AFRIN) 0.05 % nasal spray Place 1 spray into both nostrils 2 (two) times daily as needed for congestion.    [provider]  pantoprazole (PROTONIX) 40 MG tablet Take 1 tablet (40 mg total) by mouth 2 (two) times daily. 07/08/19   [name], MD  polyethylene glycol (MIRALAX / GLYCOLAX) 17 g packet Take 17 g by mouth daily as needed  (constipation).    [provider]  Potassium Chloride ER 20 MEQ TBCR Take 20 mEq by mouth daily in the afternoon. 08/29/21   [provider]  predniSONE (DELTASONE) 10 MG tablet Take 1 tablet (10 mg total) by mouth  daily with breakfast. 08/15/21   Caren Griffins, MD  rosuvastatin (CRESTOR) 20 MG tablet Take 1 tablet (20 mg total) by mouth every evening. NEED OV. 06/25/21   Croitoru, Dani Gobble, MD  sucralfate (CARAFATE) 1 g tablet Take 1 tablet (1 g total) by mouth 4 (four) times daily -  with meals and at bedtime. Patient taking differently: Take 2 g by mouth 2 (two) times daily. 04/28/21   Caren Griffins, MD  traMADol (ULTRAM) 50 MG tablet Take 50 mg by mouth daily as needed (pain). 06/07/21   [provider]  vitamin B-12 (CYANOCOBALAMIN) 1000 MCG tablet Take 1 tablet (1,000 mcg total) by mouth daily. 08/15/21   Caren Griffins, MD    Physical Exam: Vitals:   09/09/21 1815 09/09/21 1825 09/09/21 1830 09/09/21 1845  BP: (!) 118/58  (!) 109/59 124/68  Pulse: 83  73 78  Resp: '17  20 18  '$ Temp:  98.1 F (36.7 C)    TempSrc:  Oral    SpO2: 100%  100% 100%  Weight:      Height:       *** Data Reviewed: {Tip this will not be part of the note when signed- Document your independent interpretation of telemetry tracing, EKG, lab, Radiology test or any other diagnostic tests. Add any new diagnostic test ordered today. (Optional):[id number]} See HPI  Assessment and Plan: No notes have been filed under this hospital service. Service: Hospitalist     Advance Care Planning:   Code Status: Prior full  Consults: None  Family Communication: Discussed with son Laverna Peace  Severity of Illness: The appropriate patient status for this patient is INPATIENT. Inpatient status is judged to be reasonable and necessary in order to provide the required intensity of service to ensure the patient's safety. The patient's presenting symptoms, physical exam findings, and initial radiographic  and laboratory data in the context of their chronic comorbidities is felt to place them at high risk for further clinical deterioration. Furthermore, it is not anticipated that the patient will be medically stable for discharge from the hospital within 2 midnights of admission.   * I certify that at the point of admission it is my clinical judgment that the patient will require inpatient hospital care spanning beyond 2 midnights from the point of admission due to high intensity of service, high risk for further deterioration and high frequency of surveillance required.*  Author: Orene Desanctis, DO 09/09/2021 7:07 PM  For on call review [email or web address].

## 2021-09-10 NOTE — Anesthesia Preprocedure Evaluation (Addendum)
Anesthesia Evaluation  Patient identified by MRN, date of birth, ID band Patient awake    Reviewed: Allergy & Precautions, NPO status , Patient's Chart, lab work & pertinent test results  Airway Mallampati: I  TM Distance: >3 FB Neck ROM: Full    Dental  (+) Edentulous Upper, Edentulous Lower   Pulmonary asthma , sleep apnea, Continuous Positive Airway Pressure Ventilation and Oxygen sleep apnea , former smoker,  Chronic hypoxic resp failure- on 2.5-3LPM [place] at home    + decreased breath sounds      Cardiovascular hypertension, Pt. on medications + CAD, + Cardiac Stents, + CABG (2016) and +CHF (LVEF 45-50%)  + dysrhythmias Atrial Fibrillation + pacemaker + Valvular Problems/Murmurs (mild AS) AS  Rhythm:Regular Rate:Normal  Echo 07/2021 1. Technically difficult study. Left ventricular ejection fraction, by  estimation, is 45 to 50%. The left ventricle has mildly decreased  function. The left ventricle demonstrates regional wall motion  abnormalities (see scoring diagram/findings for  description). Apical hypokinesis. There is mild left ventricular  hypertrophy. Left ventricular diastolic parameters are indeterminate.  2. Right ventricular systolic function is normal. The right ventricular  size is normal. There is mildly elevated pulmonary artery systolic  pressure. The estimated right ventricular systolic pressure is 89.2 mmHg.  3. The mitral valve is degenerative. No evidence of mitral valve  regurgitation. Moderate mitral annular calcification.  4. The aortic valve was not well visualized. There is moderate  calcification of the aortic valve. Aortic valve regurgitation is trivial.  Mild aortic valve stenosis. Vmax 2.5 m/s, MG 13 mmHg, AVA 2.0 cm^2, DI  0.48  5. The inferior vena cava is dilated in size with <50% respiratory  variability, suggesting right atrial pressure of 15 mmHg.    Neuro/Psych CVA (09/2020) negative psych  ROS   GI/Hepatic Neg liver ROS, hiatal hernia, GERD  ,  Endo/Other  diabetesObesity BMI 35  Renal/GU CRF and ESRFRenal diseaseCr 3.99 earlier this year hospitalized for hip fracture and postoperatively had ATN briefly requiring dialysis. Access was later removed, but pt recently saw nephrology outpt and was told she now needs dialysis indefinitely.    negative genitourinary   Musculoskeletal  (+) Arthritis , Osteoarthritis,    Abdominal (+) + obese,   Peds  Hematology  (+) Blood dyscrasia, anemia , Hb 8, plt 161   Anesthesia Other Findings   Reproductive/Obstetrics negative OB ROS                           Anesthesia Physical Anesthesia Plan  ASA: 3  Anesthesia Plan: General   Post-op Pain Management: Ofirmev IV (intra-op)*   Induction: Intravenous  PONV Risk Score and Plan: Ondansetron, Dexamethasone and Treatment may vary due to age or medical condition  Airway Management Planned: Oral ETT  Additional Equipment: Arterial line  Intra-op Plan:   Post-operative Plan: Possible Post-op intubation/ventilation  Informed Consent: I have reviewed the patients History and Physical, chart, labs and discussed the procedure including the risks, benefits and alternatives for the proposed anesthesia with the patient or authorized representative who has indicated his/her understanding and acceptance.       Plan Discussed with: CRNA  Anesthesia Plan Comments: (Arterial line for blood draws, hemodynamic monitoring Starting hb 8- will likely need transfusion, cross x 2 units)     Anesthesia Quick Evaluation

## 2021-09-10 NOTE — Progress Notes (Signed)
Progress Note  Patient: [name] GYI:[id number] DOB: [birth year]  DOA: 09/09/2021  DOS: 09/10/2021    Brief hospital course: [name] is a 76 y.o. female with medical history significant of CAD s/p CABG in 2016, HFpEF,s/p CRT-pacemaker, chronic hypoxemic respiratory failure on 2.5 to 3 L, adrenal insufficiency, T2DM, HTN, CKD stage 4, OSA, asthma s/p fall.   Pt was walking with walker and fell when she turned landing on her buttocks and hit her forehead. She is on Plavix. She denies any lightheadedness or dizziness. No chest pain or shortness of breath.    She was found to have an unstable at T9-10 fracture and neurosurgery has deemed it an unstable fracture and will need OR tomorrow.  There is also concerns of nonunion or recurrent fracture at the site of the previous ORIF of the left femur. Family is concerned because of her heart and kidney disease. She was seen earlier this year for hip fracture and postoperatively had ATN briefly requiring dialysis. Access was later removed. However pt tells me that she had follow with nephrology last week and was told she now needs dialysis indefinitely.  She is awaiting callback on next steps.   She currently has AKI with creatinine of 4.75 up from 4.03.  She takes 160 mg of furosemide daily and still appears hypervolemic on exam with lower extremity edema although she reports this is no worse than usual.  She remained on her home 2.5-3 L O2.  Assessment and Plan: Unstable T9 vertebral fracture:  - Neurosurgery consulted, planning percutaneous instrumentation.  - Pain control as ordered, postoperative therapy evaluations planned.  Stage IV CKD: Pt reports she's told she'll be headed toward dialysis shortly, has no access currently.  - Appreciate nephrology following closely, d/w Dr. Augustin Coupe.  Creatinine has worsened to 4.75 from a prior of 4.03.  Patient reports recent follow-up with nephrologist and was told that she will now have to start on  dialysis.  Does not yet have dialysis access. -she still appears hypervolemic on exam and is on '160mg'$  of Lasix at home. Will hold tonight but advise resuming tomorrow since she has tenuous fluid status. If renal function worsens, would need nephrology consult to likely expedite plans of HD dialysis while inpatient.   Chronic HFmrEF: Recent echo with LVEF 45-50%, apical hypokinesis, normal RV size and function, mildly elevated PASP.  - Appreciate nephrology, cardiology recommendations Re: diuretic dosing - Continue antihypertensives including metoprolol succinate. - Unable to start ACE/ARB/ARNI w/advanced CKD  CAD s/p CABG 5v Oct 2016, HLD: No recent angina or advancing CHF symptoms.  - Cardiology consulted for perioperative risk evaluation. I called Dr. Zada Finders to let him know of their impending assessment, without answer, though based on my assessment, there are no further tests or treatments that would modify her very high perioperative morbidity/mortality risk. Pt aware of this as well.  - NSG feels physiologic consequences of delaying surgery for antiplatelet washout are prohibitive. Will plan to continue aspirin, plavix for now. Continue beta blocker, statin.   Chronic hypoxemic respiratory failure, COPD without exacerbation: Remains on baseline 2-3L O2.  - Maintain SpO2 >88% with 2-3L O2.   - Continue symbicort, prn bronchodilators, chronic prednisone continued.  OSA: Intolerant to CPAP  History of fracture of left hip: s/p ORIF in 03/2021 by Dr. Sammuel Hines. CT left hip suggests nonunion, for which patient is at very high risk with comorbidities, chronic steroid.  - Will involve orthopedics to get their opinion once more acute issues addressed.  Morbid obesity, OHS: Weight loss recommended.   Iatrogenic adrenal insufficiency: Due to chronic prednisone for COPD.  - Continue prednisone '10mg'$ , if hypotensive, start stress-dosed steroids.   T2DM: HbA1c 6.3%.   HTN:  - Continue  imdur/hydralazine  Right carotid stenosis: s/p TCAR.   CHB s/p PPM: Noted. With BiV ICD for severe cardiomyopathy 2008, then CRT-P 2014 with improved function.   GERD:  - Continue PPI, carafate  Gout: Quiescent.  - continue allopurinol  Subjective: Tired without new issues. Denies recent worsening of dyspnea, chest pain, leg swelling (improved somewhat actually), PND.   Objective: Vitals:   09/10/21 0923 09/10/21 1056 09/10/21 1526 09/10/21 1623  BP:  (!) 117/59 115/66 (!) 121/50  Pulse:  81 73 82  Resp:  '18 18 16  '$ Temp:  98.3 F (36.8 C) 98.9 F (37.2 C) 98.5 F (36.9 C)  TempSrc:    Oral  SpO2: 96% 99% 100% 97%  Weight:      Height:       Gen:  76 y.o. female in no distress Pulm: Nonlabored breathing supplemental oxygen. No crackles. CV: Regular rate and rhythm. Soft apical systolic murmur, no other murmur, rub, or gallop. No definite JVD, trace dependent edema. GI: Abdomen soft, non-tender, non-distended, with normoactive bowel sounds.  Ext: Warm, no new deformities Skin: Diffuse ecchymoses without acute rashes, lesions or ulcers on visualized skin. Neuro: Alert and oriented. No focal neurological deficits. Psych: Judgement and insight appear fair. Mood euthymic & affect congruent. Behavior is appropriate.    Data Personally reviewed: CBC: Recent Labs  Lab 09/09/21 1438 09/09/21 1443 09/10/21 0235  WBC 6.5  --  6.4  HGB 8.5* 9.2* 8.0*  HCT 26.7* 27.0* 25.6*  MCV 97.1  --  97.0  PLT 177  --  151   Basic Metabolic Panel: Recent Labs  Lab 09/09/21 1438 09/09/21 1443 09/10/21 0235  NA 134* 132* 138  K 3.9 3.9 3.2*  CL 91* 92* 95*  CO2 29  --  31  GLUCOSE 179* 176* 109*  BUN 118* >130* 109*  CREATININE 4.75* 4.90* 3.99*  CALCIUM 9.3  --  9.6   GFR: Estimated Creatinine Clearance: 12.6 mL/min (A) (by C-G formula based on SCr of 3.99 mg/dL (H)). Liver Function Tests: Recent Labs  Lab 09/09/21 1438  AST 20  ALT 16  ALKPHOS 93  BILITOT 0.6  PROT  6.5  ALBUMIN 3.6   No results for input(s): "LIPASE", "AMYLASE" in the last 168 hours. No results for input(s): "AMMONIA" in the last 168 hours. Coagulation Profile: Recent Labs  Lab 09/09/21 1438  INR 1.0   Cardiac Enzymes: No results for input(s): "CKTOTAL", "CKMB", "CKMBINDEX", "TROPONINI" in the last 168 hours. BNP (last 3 results) No results for input(s): "PROBNP" in the last 8760 hours. HbA1C: No results for input(s): "HGBA1C" in the last 72 hours. CBG: Recent Labs  Lab 09/10/21 1621  GLUCAP 121*   Lipid Profile: No results for input(s): "CHOL", "HDL", "LDLCALC", "TRIG", "CHOLHDL", "LDLDIRECT" in the last 72 hours. Thyroid Function Tests: No results for input(s): "TSH", "T4TOTAL", "FREET4", "T3FREE", "THYROIDAB" in the last 72 hours. Anemia Panel: No results for input(s): "VITAMINB12", "FOLATE", "FERRITIN", "TIBC", "IRON", "RETICCTPCT" in the last 72 hours. Urine analysis:    Component Value Date/Time   COLORURINE STRAW (A) 09/09/2021 1644   APPEARANCEUR CLEAR 09/09/2021 1644   LABSPEC 1.006 09/09/2021 1644   PHURINE 6.0 09/09/2021 1644   GLUCOSEU NEGATIVE 09/09/2021 1644   HGBUR NEGATIVE 09/09/2021 1644   BILIRUBINUR  NEGATIVE 09/09/2021 [place] 09/09/2021 1644   PROTEINUR NEGATIVE 09/09/2021 1644   UROBILINOGEN 0.2 12/06/2014 1531   NITRITE NEGATIVE 09/09/2021 1644   LEUKOCYTESUR NEGATIVE 09/09/2021 1644   Recent Results (from the past 240 hour(s))  Resp Panel by RT-PCR (Flu A&B, Covid) Anterior Nasal Swab     Status: None   Collection Time: 09/09/21  2:38 PM   Specimen: Anterior Nasal Swab  Result Value Ref Range Status   SARS Coronavirus 2 by RT PCR NEGATIVE NEGATIVE Final    Comment: (NOTE) SARS-CoV-2 target nucleic acids are NOT DETECTED.  The SARS-CoV-2 RNA is generally detectable in upper respiratory specimens during the acute phase of infection. The lowest concentration of SARS-CoV-2 viral copies this assay can detect is 138  copies/mL. A negative result does not preclude SARS-Cov-2 infection and should not be used as the sole basis for treatment or other patient management decisions. A negative result may occur with  improper specimen collection/handling, submission of specimen other than nasopharyngeal swab, presence of viral mutation(s) within the areas targeted by this assay, and inadequate number of viral copies(<138 copies/mL). A negative result must be combined with clinical observations, patient history, and epidemiological information. The expected result is Negative.  Fact Sheet for Patients:  EntrepreneurPulse.com.au  Fact Sheet for Healthcare Providers:  IncredibleEmployment.be  This test is no t yet approved or cleared by the [place] FDA and  has been authorized for detection and/or diagnosis of SARS-CoV-2 by FDA under an Emergency Use Authorization (EUA). This EUA will remain  in effect (meaning this test can be used) for the duration of the COVID-19 declaration under Section 564(b)(1) of the Act, 21 U.S.C.section 360bbb-3(b)(1), unless the authorization is terminated  or revoked sooner.       Influenza A by PCR NEGATIVE NEGATIVE Final   Influenza B by PCR NEGATIVE NEGATIVE Final    Comment: (NOTE) The Xpert Xpress SARS-CoV-2/FLU/RSV plus assay is intended as an aid in the diagnosis of influenza from Nasopharyngeal swab specimens and should not be used as a sole basis for treatment. Nasal washings and aspirates are unacceptable for Xpert Xpress SARS-CoV-2/FLU/RSV testing.  Fact Sheet for Patients: EntrepreneurPulse.com.au  Fact Sheet for Healthcare Providers: IncredibleEmployment.be  This test is not yet approved or cleared by the [place] FDA and has been authorized for detection and/or diagnosis of SARS-CoV-2 by FDA under an Emergency Use Authorization (EUA). This EUA will remain in effect (meaning  this test can be used) for the duration of the COVID-19 declaration under Section 564(b)(1) of the Act, 21 U.S.C. section 360bbb-3(b)(1), unless the authorization is terminated or revoked.  Performed at [facility], [place] [address], [place], [place] [address]   Surgical pcr screen     Status: None   Collection Time: 09/10/21  4:40 PM   Specimen: Nasal Mucosa; Nasal Swab  Result Value Ref Range Status   MRSA, PCR NEGATIVE NEGATIVE Final   Staphylococcus aureus NEGATIVE NEGATIVE Final    Comment: (NOTE) The Xpert SA Assay (FDA approved for NASAL specimens in patients 75 years of age and older), is one component of a comprehensive surveillance program. It is not intended to diagnose infection nor to guide or monitor treatment. Performed at [facility], [place] [address] [place], [place] [address]      CT HIP LEFT WO CONTRAST  Result Date: 09/10/2021 CLINICAL DATA:  Hip trauma, fracture suspected, xray done previous ORIF to left hip, questionable left hip non-union vs recurrent  fracture see on CT chest EXAM: CT OF THE LEFT HIP WITHOUT CONTRAST TECHNIQUE: Multidetector CT imaging of the left hip was performed according to the standard protocol. Multiplanar CT image reconstructions were also generated. RADIATION DOSE REDUCTION: This exam was performed according to the departmental dose-optimization program which includes automated exposure control, adjustment of the mA and/or kV according to patient size and/or use of iterative reconstruction technique. COMPARISON:  CT 09/09/2021 FINDINGS: Bones/Joint/Cartilage Prior intramedullary narrowing of the left femur with distal interlocking screw for a comminuted intertrochanteric fracture. Fracture fragments are in a similar configuration without evidence of new fracture. There is callus formation without any bony bridging. There is soft tissue thickening and fluid along the fracture site. The distal interlocking screws are intact. There is  sclerosis of the femoral head which is increased since March 2023, potentially reflecting developing AVN. No articular surface collapse. There is mild to moderate left hip osteoarthritis. Ligaments Suboptimally assessed by CT. Muscles and Tendons High-grade gluteal and hamstring muscle atrophy. No intramuscular collection. No acute myotendinous abnormality by CT. Soft tissues No focal fluid collection.  Colonic diverticulosis. IMPRESSION: Prior intramedullary nailing of the femur for a comminuted intertrochanteric fracture. There is callus formation without any significant bony bridging, soft tissue thickening and fluid at the fracture site suggesting nonunion. No evidence of new fracture. Electronically Signed   By: Maurine Simmering M.D.   On: 09/10/2021 08:34   CT CHEST ABDOMEN PELVIS WO CONTRAST  Result Date: 09/09/2021 CLINICAL DATA:  Blunt trauma EXAM: CT CHEST, ABDOMEN AND PELVIS WITHOUT CONTRAST TECHNIQUE: Multidetector CT imaging of the chest, abdomen and pelvis was performed following the standard protocol without IV contrast. RADIATION DOSE REDUCTION: This exam was performed according to the departmental dose-optimization program which includes automated exposure control, adjustment of the mA and/or kV according to patient size and/or use of iterative reconstruction technique. COMPARISON:  Chest radiography same day.  CT 11/20/2017. FINDINGS: CT CHEST FINDINGS Cardiovascular: Pacemaker. Cardiomegaly. Coronary artery calcification and aortic atherosclerotic calcification. Mediastinum/Nodes: No evidence of mediastinal bleeding. No adenopathy or mass. Lungs/Pleura: No pneumothorax. Areas of linear atelectasis in both lungs. 6 mm pulmonary nodule on the right image 73. 5 mm pulmonary nodule on the right image 87. No nodule seen on the left. These are similar to the study of October 2019 and therefore benign. Musculoskeletal: No rib fracture. Acute fracture of the thoracic spine at T9, possibly due to flexion or  extension mechanisms. No displaced bone compromising the spinal canal. CT ABDOMEN PELVIS FINDINGS Hepatobiliary: Liver parenchyma is normal. Pancreas: Negative Spleen: Negative Adrenals/Urinary Tract: Adrenal glands are normal. Kidneys are normal. Bladder is normal. Stomach/Bowel: No evidence of bowel injury.  No acute bowel finding. Vascular/Lymphatic: Aortic atherosclerosis. No aneurysm. IVC is normal. No adenopathy. Reproductive: No pelvic mass. Other: No free fluid or air. Musculoskeletal: Previous ORIF of left hip fracture. There is either nonunion or recurrent fracture at this site. IMPRESSION: Acute fracture of the thoracic spine at the T9 level which could be due to hyperflexion or hyper extension mechanisms. No evidence of malalignment or bone encroachment upon the canal. Nonunion or recurrent fracture at the site of previous ORIF of the left femur. No abdominal organ injury. Two nodules in the lower right lung, stable since 2019 and therefore benign. Electronically Signed   By: Nelson Chimes M.D.   On: 09/09/2021 16:27   CT T-SPINE NO CHARGE  Result Date: 09/09/2021 CLINICAL DATA:  Trauma EXAM: CT THORACIC AND LUMBAR SPINE WITHOUT CONTRAST TECHNIQUE: Multidetector CT imaging  of the thoracic and lumbar spine was performed without contrast. Multiplanar CT image reconstructions were also generated. RADIATION DOSE REDUCTION: This exam was performed according to the departmental dose-optimization program which includes automated exposure control, adjustment of the mA and/or kV according to patient size and/or use of iterative reconstruction technique. COMPARISON:  None Available. FINDINGS: CT THORACIC SPINE FINDINGS Alignment: Normal. Vertebrae: The vertebral bodies are diffusely osteopenic. There are flowing anterior osteophytes throughout the thoracic spine consistent with background diffuse idiopathic skeletal hyperostosis. There is an acute distracted fracture of the T9 vertebral body with a fracture  plane extending through the anterior endplate involving the left aspect of the bridging osteophytes between the T9 and T10 vertebral bodies extending posteriorly to the posterior aspect of the superior endplate. There is no evidence of extension to the posterior elements. There is no bony retropulsion. No other definite fracture is seen within the confines of diffuse osteopenia. There is no suspicious osseous lesion. Paraspinal and other soft tissues: The paraspinal soft tissues are unremarkable. There is no significant prevertebral hematoma. The intrathoracic contents are assessed on the separately dictated CT chest. Disc levels: There is multilevel disc space narrowing with degenerative endplate change and bulky flowing anterior osteophytes throughout the thoracic spine. There is overall mild multilevel facet arthropathy resulting in up to mild-to-moderate bilateral neural foraminal stenosis at T8-T9 and T9-T10. There is no evidence of high-grade osseous spinal canal stenosis. CT LUMBAR SPINE FINDINGS Segmentation: Standard; the lowest formed disc space is designated L5-S1. Alignment: There is grade 1 anterolisthesis of L5 on S1. Alignment is otherwise normal. Vertebrae: Vertebral body heights are preserved. There are flowing anterior osteophytes extending from the lower thoracic spine through the L2 vertebral body consistent with diffuse idiopathic skeletal hyperostosis. There is no definite acute fracture within the confines of diffusely osteopenic bones. There is no suspicious osseous lesion. Paraspinal and other soft tissues: The paraspinal soft tissues are unremarkable. The abdominal and pelvic viscera are assessed on the separately dictated CT abdomen/pelvis. Disc levels: There is marked multilevel disc desiccation and narrowing with multilevel vacuum disc phenomenon. There is advanced multilevel facet arthropathy most severe at L4-L5 and L5-S1. There are disc bulges throughout the lumbar spine resulting in  at least mild-to-moderate spinal canal stenosis at L1-L2 and L2-L3. The disc bulges and facet arthropathy results in mild-to-moderate left neural foraminal stenosis at L3-L4, moderate bilateral neural foraminal stenosis at L4-L5, and severe bilateral neural foraminal stenosis at L5-S1. IMPRESSION: 1. Acute distracted fracture of the T9 vertebral body extending from the anterior endplate involving the bridging osteophyte between the T9 and T10 vertebral bodies posteriorly to the posterior aspect of the superior endplate, superimposed on pre-existing diffuse idiopathic skeletal hyperostosis. No extension to the posterior elements or bony retropulsion. Neurosurgical consultation is recommended. 2. No other definite evidence of acute fracture in the thoracolumbar spine, though evaluation is limited due to significant osteopenia. 3. Multilevel degenerative changes as above. Electronically Signed   By: Valetta Mole M.D.   On: 09/09/2021 16:12   CT L-SPINE NO CHARGE  Result Date: 09/09/2021 CLINICAL DATA:  Trauma EXAM: CT THORACIC AND LUMBAR SPINE WITHOUT CONTRAST TECHNIQUE: Multidetector CT imaging of the thoracic and lumbar spine was performed without contrast. Multiplanar CT image reconstructions were also generated. RADIATION DOSE REDUCTION: This exam was performed according to the departmental dose-optimization program which includes automated exposure control, adjustment of the mA and/or kV according to patient size and/or use of iterative reconstruction technique. COMPARISON:  None Available. FINDINGS: CT THORACIC SPINE  FINDINGS Alignment: Normal. Vertebrae: The vertebral bodies are diffusely osteopenic. There are flowing anterior osteophytes throughout the thoracic spine consistent with background diffuse idiopathic skeletal hyperostosis. There is an acute distracted fracture of the T9 vertebral body with a fracture plane extending through the anterior endplate involving the left aspect of the bridging  osteophytes between the T9 and T10 vertebral bodies extending posteriorly to the posterior aspect of the superior endplate. There is no evidence of extension to the posterior elements. There is no bony retropulsion. No other definite fracture is seen within the confines of diffuse osteopenia. There is no suspicious osseous lesion. Paraspinal and other soft tissues: The paraspinal soft tissues are unremarkable. There is no significant prevertebral hematoma. The intrathoracic contents are assessed on the separately dictated CT chest. Disc levels: There is multilevel disc space narrowing with degenerative endplate change and bulky flowing anterior osteophytes throughout the thoracic spine. There is overall mild multilevel facet arthropathy resulting in up to mild-to-moderate bilateral neural foraminal stenosis at T8-T9 and T9-T10. There is no evidence of high-grade osseous spinal canal stenosis. CT LUMBAR SPINE FINDINGS Segmentation: Standard; the lowest formed disc space is designated L5-S1. Alignment: There is grade 1 anterolisthesis of L5 on S1. Alignment is otherwise normal. Vertebrae: Vertebral body heights are preserved. There are flowing anterior osteophytes extending from the lower thoracic spine through the L2 vertebral body consistent with diffuse idiopathic skeletal hyperostosis. There is no definite acute fracture within the confines of diffusely osteopenic bones. There is no suspicious osseous lesion. Paraspinal and other soft tissues: The paraspinal soft tissues are unremarkable. The abdominal and pelvic viscera are assessed on the separately dictated CT abdomen/pelvis. Disc levels: There is marked multilevel disc desiccation and narrowing with multilevel vacuum disc phenomenon. There is advanced multilevel facet arthropathy most severe at L4-L5 and L5-S1. There are disc bulges throughout the lumbar spine resulting in at least mild-to-moderate spinal canal stenosis at L1-L2 and L2-L3. The disc bulges and  facet arthropathy results in mild-to-moderate left neural foraminal stenosis at L3-L4, moderate bilateral neural foraminal stenosis at L4-L5, and severe bilateral neural foraminal stenosis at L5-S1. IMPRESSION: 1. Acute distracted fracture of the T9 vertebral body extending from the anterior endplate involving the bridging osteophyte between the T9 and T10 vertebral bodies posteriorly to the posterior aspect of the superior endplate, superimposed on pre-existing diffuse idiopathic skeletal hyperostosis. No extension to the posterior elements or bony retropulsion. Neurosurgical consultation is recommended. 2. No other definite evidence of acute fracture in the thoracolumbar spine, though evaluation is limited due to significant osteopenia. 3. Multilevel degenerative changes as above. Electronically Signed   By: Valetta Mole M.D.   On: 09/09/2021 16:12   CT Head Wo Contrast  Result Date: 09/09/2021 CLINICAL DATA:  Head trauma, minor (Age >= 65y); Neck trauma (Age >= 65y) EXAM: CT HEAD WITHOUT CONTRAST CT CERVICAL SPINE WITHOUT CONTRAST TECHNIQUE: Multidetector CT imaging of the head and cervical spine was performed following the standard protocol without intravenous contrast. Multiplanar CT image reconstructions of the cervical spine were also generated. RADIATION DOSE REDUCTION: This exam was performed according to the departmental dose-optimization program which includes automated exposure control, adjustment of the mA and/or kV according to patient size and/or use of iterative reconstruction technique. COMPARISON:  None Available. FINDINGS: CT HEAD FINDINGS Brain: No evidence of acute infarction, hemorrhage, hydrocephalus, extra-axial collection or mass lesion/mass effect. Vascular: Calcific intracranial atherosclerosis. Skull: No acute fracture. Sinuses/Orbits: Mild paranasal sinus mucosal thickening. No acute orbital findings. Other: No mastoid effusions. CT CERVICAL  SPINE FINDINGS Alignment: Straightening.   No substantial sagittal subluxation. Skull base and vertebrae: Vertebral body heights are maintained. No evidence of acute fracture. C4-C5 ACDF with evidence of some bony fusion across the disc space. Soft tissues and spinal canal: No prevertebral fluid or swelling. No visible canal hematoma. Disc levels: Moderate to severe multilevel degenerative disc disease and facet/uncovertebral hypertrophy with varying degrees of neural foraminal stenosis. Severe craniocervical degenerative change including bony remodeling. Upper chest: Evaluated on concurrent chest abdomen/pelvis. IMPRESSION: 1. No evidence of acute intracranial abnormality. 2. No evidence of acute fracture or traumatic malalignment in the cervical spine. Electronically Signed   By: Margaretha Sheffield M.D.   On: 09/09/2021 15:37   CT Cervical Spine Wo Contrast  Result Date: 09/09/2021 CLINICAL DATA:  Head trauma, minor (Age >= 65y); Neck trauma (Age >= 65y) EXAM: CT HEAD WITHOUT CONTRAST CT CERVICAL SPINE WITHOUT CONTRAST TECHNIQUE: Multidetector CT imaging of the head and cervical spine was performed following the standard protocol without intravenous contrast. Multiplanar CT image reconstructions of the cervical spine were also generated. RADIATION DOSE REDUCTION: This exam was performed according to the departmental dose-optimization program which includes automated exposure control, adjustment of the mA and/or kV according to patient size and/or use of iterative reconstruction technique. COMPARISON:  None Available. FINDINGS: CT HEAD FINDINGS Brain: No evidence of acute infarction, hemorrhage, hydrocephalus, extra-axial collection or mass lesion/mass effect. Vascular: Calcific intracranial atherosclerosis. Skull: No acute fracture. Sinuses/Orbits: Mild paranasal sinus mucosal thickening. No acute orbital findings. Other: No mastoid effusions. CT CERVICAL SPINE FINDINGS Alignment: Straightening.  No substantial sagittal subluxation. Skull base and  vertebrae: Vertebral body heights are maintained. No evidence of acute fracture. C4-C5 ACDF with evidence of some bony fusion across the disc space. Soft tissues and spinal canal: No prevertebral fluid or swelling. No visible canal hematoma. Disc levels: Moderate to severe multilevel degenerative disc disease and facet/uncovertebral hypertrophy with varying degrees of neural foraminal stenosis. Severe craniocervical degenerative change including bony remodeling. Upper chest: Evaluated on concurrent chest abdomen/pelvis. IMPRESSION: 1. No evidence of acute intracranial abnormality. 2. No evidence of acute fracture or traumatic malalignment in the cervical spine. Electronically Signed   By: Margaretha Sheffield M.D.   On: 09/09/2021 15:37   DG Pelvis Portable  Result Date: 09/09/2021 CLINICAL DATA:  A 76 year old female presents following fall, patient is on blood thinners. EXAM: PORTABLE PELVIS 1-2 VIEWS COMPARISON:  August 27, 2021. FINDINGS: This study is markedly limited by patient body habitus. Scattered loops of gas and stool filled bowel project over the lower abdomen and over the pelvis. These bowel loops limited pelvic assessment. No displaced fracture or destructive bone lesion of the pelvis accounting for limitations on the current study. Degenerative changes are present in the bilateral hips. Evidence of prior ORIF with femoral nailing on the LEFT. The distal aspect of the LEFT femoral nail is not assessed as it passes beyond the mid shaft of the femur. Callus formation at the site of prior fracture in the intratrochanteric LEFT femur is similar to previous imaging. Soft tissues are otherwise unremarkable. Degenerative changes are noted in the spine. IMPRESSION: 1. Markedly limited study due to patient body habitus. 2. No displaced fracture or destructive bone lesion of the pelvis. 3. Evidence of prior ORIF on the LEFT with femoral nailing, femoral nail extends beyond the mid femur. 4. Callus formation at  the site of prior fracture in the intratrochanteric LEFT femur is similar to previous imaging. Electronically Signed   By: Zetta Bills  M.D.   On: 09/09/2021 14:52   DG Chest Port 1 View  Result Date: 09/09/2021 CLINICAL DATA:  76 year old female presenting with fall on blood thinners. EXAM: PORTABLE CHEST 1 VIEW COMPARISON:  July 15, 2021. FINDINGS: EKG leads project over the chest.  Post median sternotomy for CABG. RIGHT sided multi lead pacer defibrillator remains in place. Cardiomediastinal contours and hilar structures are stable with signs of median sternotomy for CABG. No lobar consolidation.  No sign of pneumothorax. On limited assessment there is no acute skeletal finding. IMPRESSION: No acute radiographic findings in the chest. Electronically Signed   By: Zetta Bills M.D.   On: 09/09/2021 14:49     Family Communication: None at bedside  Disposition: Status is: Inpatient Remains inpatient appropriate because: Operative management Planned Discharge Destination:  TBD based on postop therapy consultations.   Patrecia Pour, MD 09/10/2021 6:59 PM Page by [email or web address]

## 2021-09-10 NOTE — Progress Notes (Signed)
Neurosurgery Service Post-operative progress note  Assessment & Plan: 76 y.o. woman s/p perc instrumentation of thoracic fracture, no intra-op complications.  -activity as tolerated, no spine precautions, no brace needed -resume diet as tolerated -okay for DVT chemoprophylaxis 09/12/21  Judith Part  09/10/21 8:07 PM

## 2021-09-10 NOTE — Consult Note (Addendum)
Cardiology Consultation:   Patient ID: [name] MRN: [id number]; DOB: August 12, 1945  Admit date: 09/09/2021 Date of Consult: 09/10/2021  PCP:  Lujean Amel, MD   [facility] Providers Cardiologist:  Sanda Klein, MD     Patient Profile:   [name] is a 76 y.o. female with a hx of CAD status post CABG ' 16 (LIMA to LAD, SVG to D1, SVG to OM1 and OM2, SVG to PDA), HFpEF, adrenal insufficiency, type 2 diabetes, hypertension, CKD stage III, OSA, asthma, CHB s/p PPM, carotid artery disease s/p TCAR and morbid obesity who is being seen 09/10/2021 for the evaluation of preoperative evaluation at the request of Dr. Bonner Puna.  History of Present Illness:   [name] is a 76 year old female with past medical history noted above.  She has been followed by Dr. [name] as an outpatient.  Developed complete heart block and underwent conventional pacemaker implant in 2001.  This was upgraded to a biventricular ICD which was implanted in 2008 for severe cardiomyopathy with an EF of 20%.  Following excellent response to resynchronization therapy she was downgraded to a CRT-P in 2014.  Underwent five-vessel CABG in October 2016 with LIMA to LAD, SVG to D1, SVG to OM1 and OM 2 and SVG to PDA.  She had a TIA with left hemiparesis and facial droop 09/2020 and received a right internal carotid artery stent with Dr. Carlis Abbott at that time.  Echocardiogram showed LVEF of 55 to 60%.  Underwent elective pacemaker generator change out 12/2020.  She was hospitalized a week later with significant volume overload and diuresed 4.5 L.  Had symptoms of heart failure in fall 2022 and echocardiogram 01/2021 showed decline in LVEF to 40 to 45% with concern for increased left atrial pressures.  She underwent outpatient right and left heart catheterization on 02/2019 with unchanged coronary anatomy and left heart filling pressures were compensated with evidence of mild pulmonary hypertension.  Seen in the ER 02/2021 with  cellulitis of her lower extremity.  She had a prolonged hospitalization 04/2021 due to sepsis which was complicated by renal insufficiency and acute kidney injury leading to the need for dialysis with substantial residual chronic kidney disease progressed to CKD stage IV but no longer requiring dialysis.  She required prolonged rehabilitation.  She was hospitalized again 07/2021 with cellulitis and required substantial volume resuscitation which was treated with diuretics.  She was discharged at a weight of 222 pounds but still with lower extremity edema.  Last seen in the office 09/02/2021 with Dr. [name].  Device interrogation noted normal function with 98% biventricular pacing, generator longevity 8.5 yrs. She was noted to be volume overloaded at this visit was recommended to continue on her regimen of 160 mg of Lasix daily, along with beta-blocker and hydralazine/nitrate therapy.   Presented to the ED on 7/31 after a fall at home.  Patient noted she was using a walker and fell when she went to turn in the bedroom and struck her forehead.  Labs in the ED showed sodium 134, potassium 3.9, creatinine 4.7, BNP 569, WBC 6.5, hemoglobin 8.5.  EKG V paced. Work-up in the ED revealed her to have an unstable T8-9/T10 fracture and was seen by neurosurgery.  It was deemed an unstable fracture with plans to take her to the OR for perc screws later today as waiting a week of bed rest for Plavix to washout would have significant physiologic consequences.   In talking with patient she reports her volume status has  actually improved since being seen in the office recently.  Her lower extremity edema had improved.  Felt like she was breathing at her baseline on admission. No chest pain.  Past Medical History:  Diagnosis Date   AICD (automatic cardioverter/defibrillator) present    downgraded to CRT-P in 2014   Anemia    Arthritis    Asthma    Atrial fibrillation ([place]) 10/24/2015   Questionable history of A Fib/A  Flutter. On device interrogation on 9/7, showed 0.3mn of A Fib/A Flutter with 1% burden.  Device check in Jan 2018 showed no sustained AF (runs of less than 30 seconds). No anticoagulation indicated.   CAD (coronary artery disease)    a. 10/09/16 LHC: SVG->LAD patent, SVG->Diag patent, SVG->RCA patent, SVG->LCx occluded. EF 60%, b. 10/31/16 LHC DES to AV groove Circ, DES to intermed branch   Chronic lower back pain    Fatty liver disease, nonalcoholic    GERD (gastroesophageal reflux disease)    Gout    H/O hiatal hernia    High cholesterol    Hyperplastic colon polyp    Hypertension    IBS (irritable bowel syndrome)    Internal hemorrhoids    INTERNAL HEMORRHOIDS WITHOUT MENTION COMP 04/12/2007   Qualifier: Diagnosis of  By: BOlevia PerchesMD, Dora M    Nonischemic cardiomyopathy ([place] 11/22/2011   Pt responded to BiV ICD- last EF 55-60% Sept 2017   Obesity    OSA (obstructive sleep apnea)    "can't tolerate a mask", wears 2L nocturnal O2   Presence of permanent cardiac pacemaker    11/02/12 [employer] V273 INTUA PPM   Stroke ([facility] 09/2020   Type II diabetes mellitus ([place]     Past Surgical History:  Procedure Laterality Date   ABDOMINAL ULTRASOUND  12/01/2011   Peripelvic cysts- #1- 2.4x1.9x2.3cm, #2-1.2x0.9x1.2cm   ANKLE FRACTURE SURGERY Right    "had rod put in"   ANTERIOR CERVICAL DECOMP/DISCECTOMY FUSION     APPENDECTOMY     BACK SURGERY     BIOPSY  12/29/2017   Procedure: BIOPSY;  Surgeon: NMauri Pole MD;  [facility];  Service: Endoscopy;;   BIV ICD INSERTION CRT-D  2001?   BIV PACEMAKER GENERATOR CHANGE OUT N/A 11/02/2012   Procedure: BIV PACEMAKER GENERATOR CHANGE OUT;  Surgeon: MSanda Klein MD;  [facility];  Service: Cardiovascular;  Laterality: N/A;   CARDIAC CATHETERIZATION  05/17/1999   No significant coronary obstructive disease w/ mild 20% luminal irregularity of the first diag branch of the LAD   CARDIAC CATHETERIZATION  07/08/2002    No significant CAD, moderately depressed LV systolic function   CARDIAC CATHETERIZATION Bilateral 04/26/2007   Normal findings, recommend medical therapy   CARDIAC CATHETERIZATION  02/18/2008   Moderate CAD, would benefit from having a functional study, recommend continue medical therapy   CARDIAC CATHETERIZATION  07/23/2012   Medical therapy   CARDIAC CATHETERIZATION N/A 11/24/2014   Procedure: Left Heart Cath and Coronary Angiography;  Surgeon: TTroy Sine MD;  [facility];  Service: Cardiovascular;  Laterality: N/A;   CARDIAC CATHETERIZATION  11/27/2014   Procedure: Intravascular Pressure Wire/FFR Study;  Surgeon: Peter M JMartinique MD;  [facility];  Service: Cardiovascular;;   CARDIAC CATHETERIZATION  10/09/2016   CHOLECYSTECTOMY N/A 04/09/2018   Procedure: LAPAROSCOPIC CHOLECYSTECTOMY;  Surgeon: WGreer Pickerel MD;  [facility];  Service: General;  Laterality: N/A;   COLONOSCOPY WITH PROPOFOL N/A 12/29/2017   Procedure: COLONOSCOPY WITH  PROPOFOL;  Surgeon: [name], MD;  [facility];  Service: Endoscopy;  Laterality: N/A;   CORONARY ANGIOGRAM  2010   CORONARY ARTERY BYPASS GRAFT N/A 11/29/2014   Procedure: CORONARY ARTERY BYPASS GRAFTING x 5 (LIMA-LAD, SVG-D, SVG-OM1-OM2, SVG-PD);  Surgeon: Melrose Nakayama, MD;  Location: [place];  Service: Open Heart Surgery;  Laterality: N/A;   CORONARY STENT INTERVENTION N/A 10/31/2016   Procedure: CORONARY STENT INTERVENTION;  Surgeon: Burnell Blanks, MD;  Location: [place] CV LAB;  Service: Cardiovascular;  Laterality: N/A;   ESOPHAGOGASTRODUODENOSCOPY (EGD) WITH PROPOFOL N/A 12/29/2017   Procedure: ESOPHAGOGASTRODUODENOSCOPY (EGD) WITH PROPOFOL;  Surgeon: [name], MD;  [facility];  Service: Endoscopy;  Laterality: N/A;   FRACTURE SURGERY     INSERT / REPLACE / REMOVE PACEMAKER  1999   INTRAMEDULLARY (IM) NAIL INTERTROCHANTERIC Left 04/04/2021    Procedure: INTRAMEDULLARY (IM) NAIL INTERTROCHANTRIC;  Surgeon: Vanetta Mulders, MD;  Location: [place];  Service: Orthopedics;  Laterality: Left;   IR FLUORO GUIDE CV LINE LEFT  04/11/2021   IR US GUIDE VASC ACCESS LEFT  04/11/2021   KNEE ARTHROSCOPY Bilateral    LEFT HEART CATH AND CORS/GRAFTS ANGIOGRAPHY N/A 12/09/2017   Procedure: LEFT HEART CATH AND CORS/GRAFTS ANGIOGRAPHY;  Surgeon: Troy Sine, MD;  Location: [place] CV LAB;  Service: Cardiovascular;  Laterality: N/A;   LEFT HEART CATHETERIZATION WITH CORONARY ANGIOGRAM N/A 07/23/2012   Procedure: LEFT HEART CATHETERIZATION WITH CORONARY ANGIOGRAM;  Surgeon: Leonie Man, MD;  Location: [facility] LAB;  Service: Cardiovascular;  Laterality: N/A;   LEXISCAN MYOVIEW  11/14/2011   Mild-moderate defect seen in Mid Inferolateral and Mid Anterolateral regions-consistant w/ infarct/scar. No significant ischemia demonstrated.   POLYPECTOMY  12/29/2017   Procedure: POLYPECTOMY;  Surgeon: [name], MD;  [facility];  Service: Endoscopy;;   PPM GENERATOR CHANGEOUT N/A 12/31/2020   Procedure: PPM GENERATOR CHANGEOUT;  Surgeon: Sanda Klein, MD;  Location: [place] CV LAB;  Service: Cardiovascular;  Laterality: N/A;   RIGHT/LEFT HEART CATH AND CORONARY ANGIOGRAPHY N/A 10/09/2016   Procedure: RIGHT/LEFT HEART CATH AND CORONARY ANGIOGRAPHY;  Surgeon: Jolaine Artist, MD;  Location: [place] CV LAB;  Service: Cardiovascular;  Laterality: N/A;   RIGHT/LEFT HEART CATH AND CORONARY/GRAFT ANGIOGRAPHY N/A 03/11/2019   Procedure: RIGHT/LEFT HEART CATH AND CORONARY/GRAFT ANGIOGRAPHY;  Surgeon: Jolaine Artist, MD;  Location: [place] CV LAB;  Service: Cardiovascular;  Laterality: N/A;   TEE WITHOUT CARDIOVERSION N/A 11/29/2014   Procedure: TRANSESOPHAGEAL ECHOCARDIOGRAM (TEE);  Surgeon: Melrose Nakayama, MD;  Location: [place];  Service: Open Heart Surgery;  Laterality: N/A;   TRANSCAROTID ARTERY REVASCULARIZATION   Right 10/01/2020   Procedure: RIGHT TRANSCAROTID ARTERY REVASCULARIZATION;  Surgeon: [name], MD;  Location: [place];  Service: Vascular;  Laterality: Right;   TRANSTHORACIC ECHOCARDIOGRAM  07/23/2012   EF 55-60%, normal-mild   TUBAL LIGATION     ULTRASOUND GUIDANCE FOR VASCULAR ACCESS Left 10/01/2020   Procedure: ULTRASOUND GUIDANCE FOR VASCULAR ACCESS;  Surgeon: [name], MD;  Location: [place];  Service: Vascular;  Laterality: Left;     Home Medications:  Prior to Admission medications   Medication Sig Start Date End Date Taking? Authorizing Provider  acetaminophen (TYLENOL) 500 MG tablet Take 1,000 mg by mouth 3 (three) times daily as needed (for pain or headaches).   Yes [provider]  albuterol (PROVENTIL HFA;VENTOLIN HFA) 108 (90 Base) MCG/ACT inhaler Inhale 2 puffs into the lungs every 6 (  six) hours as needed for wheezing or shortness of breath. 05/02/18  Yes Fuller Plan A, MD  albuterol (PROVENTIL) (2.5 MG/3ML) 0.083% nebulizer solution Take 3 mLs (2.5 mg total) by nebulization every 6 (six) hours as needed for wheezing or shortness of breath. 05/02/18  Yes Fuller Plan A, MD  allopurinol (ZYLOPRIM) 100 MG tablet Take 1 tablet (100 mg total) by mouth daily. Patient taking differently: Take 100 mg by mouth every morning. 03/14/21  Yes Croitoru, [name], MD  aspirin EC 81 MG tablet Take 1 tablet (81 mg total) by mouth daily. Patient taking differently: Take 81 mg by mouth every morning. 03/27/16  Yes Kilroy, Luke K, PA-C  budesonide-formoterol (SYMBICORT) 160-4.5 MCG/ACT inhaler Inhale 2 puffs into the lungs 2 (two) times daily.   Yes [provider]  clopidogrel (PLAVIX) 75 MG tablet Take 1 tablet (75 mg total) by mouth daily. Patient taking differently: Take 75 mg by mouth every morning. 02/28/21  Yes Croitoru, [name], MD  cycloSPORINE (RESTASIS) 0.05 % ophthalmic emulsion Place 1 drop into both eyes 2 (two) times daily.   Yes [provider]   EPINEPHrine 0.3 mg/0.3 mL IJ SOAJ injection Inject 0.3 mg into the muscle once as needed for anaphylaxis (severe allergic reaction).   Yes [provider]  furosemide (LASIX) 80 MG tablet Take 2 tablets (160 mg total) by mouth daily. 08/16/21 10/15/21 Yes Gherghe, Vella Redhead, MD  hydrALAZINE (APRESOLINE) 25 MG tablet Take 1 tablet (25 mg total) by mouth every 8 (eight) hours. 08/15/21  Yes Caren Griffins, MD  hydrOXYzine (ATARAX) 25 MG tablet Take 25 mg by mouth 2 (two) times daily as needed for itching. 06/07/21  Yes [provider]  isosorbide mononitrate (IMDUR) 30 MG 24 hr tablet TAKE 3 TABLETS BY MOUTH  DAILY Patient taking differently: Take 90 mg by mouth every morning. 05/15/21  Yes Croitoru, [name], MD  Lidocaine 0.5 % AERO Place 1 spray rectally every 6 (six) hours as needed (hemorrhoids). Preparation H rapid relief lidocaine spray   Yes [provider]  metoprolol succinate (TOPROL-XL) 50 MG 24 hr tablet Take 1 tablet (50 mg total) by mouth daily. Take with or immediately following a meal. 08/16/21  Yes Gherghe, Vella Redhead, MD  nitroGLYCERIN (NITROSTAT) 0.4 MG SL tablet Place 1 tablet (0.4 mg total) under the tongue every 5 (five) minutes as needed for chest pain. 11/13/16 03/18/22 Yes Kilroy, Luke K, PA-C  ondansetron (ZOFRAN) 4 MG tablet Take 4 mg by mouth daily as needed for nausea or vomiting. 07/24/21  Yes [provider]  OXYGEN Inhale 2-3 L/min into the lungs continuous.   Yes [provider]  oxymetazoline (AFRIN) 0.05 % nasal spray Place 1 spray into both nostrils 2 (two) times daily as needed for congestion.   Yes [provider]  pantoprazole (PROTONIX) 40 MG tablet Take 1 tablet (40 mg total) by mouth 2 (two) times daily. 07/08/19  Yes Nandigam, Venia Minks, MD  polyethylene glycol (MIRALAX / GLYCOLAX) 17 g packet Take 17 g by mouth daily as needed (constipation).   Yes [provider]  Potassium Chloride ER 20 MEQ TBCR Take 20 mEq by  mouth daily in the afternoon. 08/29/21  Yes [provider]  predniSONE (DELTASONE) 10 MG tablet Take 1 tablet (10 mg total) by mouth daily with breakfast. 08/15/21  Yes Gherghe, Vella Redhead, MD  rosuvastatin (CRESTOR) 20 MG tablet Take 1 tablet (20 mg total) by mouth every evening. NEED OV. 06/25/21  Yes Croitoru,  [name], MD  sucralfate (CARAFATE) 1 g tablet Take 1 tablet (1 g total) by mouth 4 (four) times daily -  with meals and at bedtime. Patient taking differently: Take 2 g by mouth 2 (two) times daily. 04/28/21  Yes Gherghe, Vella Redhead, MD  traMADol (ULTRAM) 50 MG tablet Take 50 mg by mouth daily as needed (pain). 06/07/21  Yes [provider]  vitamin B-12 (CYANOCOBALAMIN) 1000 MCG tablet Take 1 tablet (1,000 mcg total) by mouth daily. 08/15/21  Yes Caren Griffins, MD  ACCU-CHEK AVIVA PLUS test strip Check blood sugar twice daily 10/19/19   [provider]  Accu-Chek Softclix Lancets lancets Check blood sugar twice daily 08/10/19   [provider]  Alcohol Swabs (B-D SINGLE USE SWABS REGULAR) PADS  08/10/19   [provider]  linaclotide Rolan Lipa) 145 MCG CAPS capsule Take 145 mcg by mouth daily before breakfast. Patient not taking: Reported on 09/02/2021    [provider]    Inpatient Medications: Scheduled Meds:  allopurinol  100 mg Oral Daily   aspirin EC  81 mg Oral Daily   chlorhexidine  15 mL Mouth/Throat Once   Or   mouth rinse  15 mL Mouth Rinse Once   clopidogrel  75 mg Oral Daily   cyanocobalamin  1,000 mcg Oral Daily   cycloSPORINE  1 drop Both Eyes BID   hydrALAZINE  25 mg Oral Q8H   isosorbide mononitrate  90 mg Oral Daily   metoprolol succinate  50 mg Oral Daily   mometasone-formoterol  2 puff Inhalation BID   pantoprazole  40 mg Oral BID   potassium chloride  20 mEq Oral Q1500   predniSONE  10 mg Oral Q breakfast   rosuvastatin  20 mg Oral QPM   sucralfate  2 g Oral BID   Continuous Infusions:  lactated ringers     PRN  Meds: albuterol, HYDROmorphone (DILAUDID) injection, polyethylene glycol  Allergies:    Allergies  Allergen Reactions   Ivp Dye [Iodinated Contrast Media] Shortness Of Breath    No reaction to PO contrast with non-ionic dye.06-25-2014/rsm   Shellfish Allergy Anaphylaxis   Sulfa Antibiotics Shortness Of Breath   Iodine Hives   Atorvastatin Other (See Comments)    Pt states "causes bilateral leg pain/cramps."   Benadryl [Diphenhydramine] Other (See Comments)    "THIS DRIVES ME CRAZY AND MAKES ME FEEL LIKE I AM DYING"    Colchicine Nausea And Vomiting   Doxycycline Other (See Comments)    Unknown reaction, per patient   Uloric [Febuxostat] Other (See Comments)    Unknown reaction   Cephalexin Itching and Rash   Zithromax [Azithromycin] Rash    Social History:   Social History   Socioeconomic History   Marital status: Widowed    Spouse name: Felita Bump- deceased   Number of children: 5   Years of education: Not on file   Highest education level: Not on file  Occupational History   Occupation: STAFF/BUFFET    Employer: [employer]  Tobacco Use   Smoking status: Former    Packs/day: 0.25    Years: 3.00    Total pack years: 0.75    Types: Cigarettes    Quit date: 02/11/1968    Years since quitting: 53.6   Smokeless tobacco: Never  Vaping Use   Vaping Use: Never used  Substance and Sexual Activity   Alcohol use: No   Drug use: No   Sexual activity: Never  Other Topics  Concern   Not on file  Social History Narrative   Widowed last year. Spouse Mike Gip Sr (25) died on date 29-Jan-2012 2022-05-15) at his home Was a [place] [employer] -[place] [employer]   She lives with her two sons, Thayer Ohm    Other sons Haze Rushing   Social Determinants of Health   Financial Resource Strain: High Risk (12/18/2016)   Overall Financial Resource Strain (CARDIA)    Difficulty of Paying Living Expenses: Very hard  Food Insecurity: No Food Insecurity  (01/15/2021)   Hunger Vital Sign    Worried About Running Out of Food in the Last Year: Never true    [place] in the Last Year: Never true  Transportation Needs: No Transportation Needs (02/05/2021)   PRAPARE - Hydrologist (Medical): No    Lack of Transportation (Non-Medical): No  Physical Activity: Inactive (12/18/2016)   Exercise Vital Sign    Days of Exercise per Week: 0 days    Minutes of Exercise per Session: 0 min  Stress: Stress Concern Present (12/18/2016)   Force    Feeling of Stress : Very much  Social Connections: Moderately Integrated (02/05/2021)   Social Connection and Isolation Panel [NHANES]    Frequency of Communication with Friends and Family: Three times a week    Frequency of Social Gatherings with Friends and Family: Three times a week    Attends Religious Services: 1 to 4 times per year    Active Member of Clubs or Organizations: Yes    Attends Archivist Meetings: 1 to 4 times per year    Marital Status: Widowed  Intimate Partner Violence: Not At Risk (02/05/2021)   Humiliation, Afraid, Rape, and Kick questionnaire    Fear of Current or Ex-Partner: No    Emotionally Abused: No    Physically Abused: No    Sexually Abused: No    Family History:    Family History  Problem Relation Age of Onset   Breast cancer Mother    Diabetes Mother    Asthma Sister    Heart disease Maternal Grandmother    Kidney disease Maternal Grandmother    Diabetes Maternal Grandmother    Glaucoma Maternal Aunt    Heart disease Maternal Aunt    Colon cancer Neg Hx    Stomach cancer Neg Hx    Pancreatic cancer Neg Hx      ROS:  Please see the history of present illness.   All other ROS reviewed and negative.     Physical Exam/Data:   Vitals:   09/10/21 0510 09/10/21 0748 09/10/21 0923 09/10/21 1056  BP: 137/68 121/65  (!) 117/59  Pulse: 69 89  81  Resp:   18  18  Temp: 97.7 F (36.5 C) 98.3 F (36.8 C)  98.3 F (36.8 C)  TempSrc: Oral Oral    SpO2: 100% 97% 96% 99%  Weight:      Height:        Intake/Output Summary (Last 24 hours) at 09/10/2021 1449 Last data filed at 09/10/2021 0300 Gross per 24 hour  Intake --  Output 650 ml  Net -650 ml      09/09/2021    2:31 PM 09/02/2021   11:19 AM 08/21/2021    2:10 PM  Last 3 Weights  Weight (lbs) 198 lb 201 lb 203 lb  Weight (kg) 89.812 kg 91.173 kg 92.08  kg     Body mass index is 35.07 kg/m.  General:  Well nourished, well developed, in no acute distress. Wearing Nordic @ 2L HEENT: normal Neck: no JVD Vascular: No carotid bruits; Distal pulses 2+ bilaterally Cardiac:  normal S1, S2; RRR; no murmur  Lungs:  clear to auscultation bilaterally, no wheezing, rhonchi or rales  Abd: soft, nontender, no hepatomegaly  Ext: mild bilateral LE edema Musculoskeletal:  No deformities, BUE and BLE strength normal and equal Skin: warm and dry  Neuro:  CNs 2-12 intact, no focal abnormalities noted Psych:  Normal affect   EKG:  The EKG was personally reviewed and demonstrates:  sinus rhythm, V paced Telemetry:  Telemetry was personally reviewed and demonstrates: Sinus rhythm, V pacing  Relevant CV Studies:  Cath: 02/2019  Ost 1st Diag lesion is 60% stenosed. Ost 2nd Diag lesion is 50% stenosed. Mid LAD lesion is 100% stenosed. Non-stenotic Ost Cx to Mid Cx lesion was previously treated. Prox RCA lesion is 100% stenosed. Non-stenotic Ost Ramus to Ramus lesion was previously treated. Origin to Prox Graft lesion before Ramus is 100% stenosed.   Findings:   Ao = 136/68 (95)  LV =  155/11 RA =  7 RV = 34/9 PA = 34/17 (24) PCW = 14 Fick cardiac output/index = 6.8/3.1 PVR = 1.5 WU FA sat = 96% PA sat = 65%, 75%, 69%   Assessment: 1. Stable coronary anatomy with no change from previous. SVG to OM system chronically occluded with previous PCI of native LCX system. Remainder of grafts are  widely patent 2. Very mild PAH with normal left-sided pressures. 3. Very difficult procedure given patient's size and fidgeting on cath table   Plan/Discussion:   Medical therapy.    Alicia Bickers, MD  3:07 PM  Echo: 08/05/21  IMPRESSIONS     1. Technically difficult study. Left ventricular ejection fraction, by  estimation, is 45 to 50%. The left ventricle has mildly decreased  function. The left ventricle demonstrates regional wall motion  abnormalities (see scoring diagram/findings for  description). Apical hypokinesis. There is mild left ventricular  hypertrophy. Left ventricular diastolic parameters are indeterminate.   2. Right ventricular systolic function is normal. The right ventricular  size is normal. There is mildly elevated pulmonary artery systolic  pressure. The estimated right ventricular systolic pressure is 74.2 mmHg.   3. The mitral valve is degenerative. No evidence of mitral valve  regurgitation. Moderate mitral annular calcification.   4. The aortic valve was not well visualized. There is moderate  calcification of the aortic valve. Aortic valve regurgitation is trivial.  Mild aortic valve stenosis. Vmax 2.5 m/s, MG 13 mmHg, AVA 2.0 cm^2, DI  0.48   5. The inferior vena cava is dilated in size with <50% respiratory  variability, suggesting right atrial pressure of 15 mmHg.   FINDINGS   Left Ventricle: Left ventricular ejection fraction, by estimation, is 45  to 50%. The left ventricle has mildly decreased function. The left  ventricle demonstrates regional wall motion abnormalities. The left  ventricular internal cavity size was normal  in size. There is mild left ventricular hypertrophy. Left ventricular  diastolic parameters are indeterminate.      LV Wall Scoring:  The apical lateral segment, apical inferior segment, and apex are  hypokinetic. The entire anterior wall, antero-lateral wall, entire septum,  inferior wall, and posterior wall are  normal.   Right Ventricle: The right ventricular size is normal. Right vetricular  wall thickness was not well  visualized. Right ventricular systolic  function is normal. There is mildly elevated pulmonary artery systolic  pressure. The tricuspid regurgitant velocity   is 2.59 m/s, and with an assumed right atrial pressure of 15 mmHg, the  estimated right ventricular systolic pressure is 53.6 mmHg.   Left Atrium: Left atrial size was normal in size.   Right Atrium: Right atrial size was normal in size.   Pericardium: There is no evidence of pericardial effusion.   Mitral Valve: The mitral valve is degenerative in appearance. Moderate  mitral annular calcification. No evidence of mitral valve regurgitation.   Tricuspid Valve: The tricuspid valve is normal in structure. Tricuspid  valve regurgitation is trivial.   Aortic Valve: The aortic valve was not well visualized. There is moderate  calcification of the aortic valve. Aortic valve regurgitation is trivial.  Mild aortic stenosis is present. Aortic valve mean gradient measures 11.7  mmHg. Aortic valve peak gradient   measures 22.9 mmHg. Aortic valve area, by VTI measures 2.03 cm.   Pulmonic Valve: The pulmonic valve was not well visualized. Pulmonic valve  regurgitation is trivial.   Aorta: The aortic root is normal in size and structure.   Venous: The inferior vena cava is dilated in size with less than 50%  respiratory variability, suggesting right atrial pressure of 15 mmHg.   IAS/Shunts: The interatrial septum was not well visualized.   Laboratory Data:  High Sensitivity Troponin:  No results for input(s): "TROPONINIHS" in the last 720 hours.   Chemistry Recent Labs  Lab 09/09/21 1438 09/09/21 1443 09/10/21 0235  NA 134* 132* 138  K 3.9 3.9 3.2*  CL 91* 92* 95*  CO2 29  --  31  GLUCOSE 179* 176* 109*  BUN 118* >130* 109*  CREATININE 4.75* 4.90* 3.99*  CALCIUM 9.3  --  9.6  GFRNONAA 9*  --  11*  ANIONGAP 14   --  12    Recent Labs  Lab 09/09/21 1438  PROT 6.5  ALBUMIN 3.6  AST 20  ALT 16  ALKPHOS 93  BILITOT 0.6   Lipids No results for input(s): "CHOL", "TRIG", "HDL", "LABVLDL", "LDLCALC", "CHOLHDL" in the last 168 hours.  Hematology Recent Labs  Lab 09/09/21 1438 09/09/21 1443 09/10/21 0235  WBC 6.5  --  6.4  RBC 2.75*  --  2.64*  HGB 8.5* 9.2* 8.0*  HCT 26.7* 27.0* 25.6*  MCV 97.1  --  97.0  MCH 30.9  --  30.3  MCHC 31.8  --  31.3  RDW 12.9  --  12.9  PLT 177  --  161   Thyroid No results for input(s): "TSH", "FREET4" in the last 168 hours.  BNP Recent Labs  Lab 09/09/21 1438  BNP 569.7*    DDimer No results for input(s): "DDIMER" in the last 168 hours.   Radiology/Studies:  CT HIP LEFT WO CONTRAST  Result Date: 09/10/2021 CLINICAL DATA:  Hip trauma, fracture suspected, xray done previous ORIF to left hip, questionable left hip non-union vs recurrent fracture see on CT chest EXAM: CT OF THE LEFT HIP WITHOUT CONTRAST TECHNIQUE: Multidetector CT imaging of the left hip was performed according to the standard protocol. Multiplanar CT image reconstructions were also generated. RADIATION DOSE REDUCTION: This exam was performed according to the departmental dose-optimization program which includes automated exposure control, adjustment of the mA and/or kV according to patient size and/or use of iterative reconstruction technique. COMPARISON:  CT 09/09/2021 FINDINGS: Bones/Joint/Cartilage Prior intramedullary narrowing of the left femur with distal  interlocking screw for a comminuted intertrochanteric fracture. Fracture fragments are in a similar configuration without evidence of new fracture. There is callus formation without any bony bridging. There is soft tissue thickening and fluid along the fracture site. The distal interlocking screws are intact. There is sclerosis of the femoral head which is increased since March 2023, potentially reflecting developing AVN. No articular  surface collapse. There is mild to moderate left hip osteoarthritis. Ligaments Suboptimally assessed by CT. Muscles and Tendons High-grade gluteal and hamstring muscle atrophy. No intramuscular collection. No acute myotendinous abnormality by CT. Soft tissues No focal fluid collection.  Colonic diverticulosis. IMPRESSION: Prior intramedullary nailing of the femur for a comminuted intertrochanteric fracture. There is callus formation without any significant bony bridging, soft tissue thickening and fluid at the fracture site suggesting nonunion. No evidence of new fracture. Electronically Signed   By: Maurine Simmering M.D.   On: 09/10/2021 08:34   CT CHEST ABDOMEN PELVIS WO CONTRAST  Result Date: 09/09/2021 CLINICAL DATA:  Blunt trauma EXAM: CT CHEST, ABDOMEN AND PELVIS WITHOUT CONTRAST TECHNIQUE: Multidetector CT imaging of the chest, abdomen and pelvis was performed following the standard protocol without IV contrast. RADIATION DOSE REDUCTION: This exam was performed according to the departmental dose-optimization program which includes automated exposure control, adjustment of the mA and/or kV according to patient size and/or use of iterative reconstruction technique. COMPARISON:  Chest radiography same day.  CT 11/20/2017. FINDINGS: CT CHEST FINDINGS Cardiovascular: Pacemaker. Cardiomegaly. Coronary artery calcification and aortic atherosclerotic calcification. Mediastinum/Nodes: No evidence of mediastinal bleeding. No adenopathy or mass. Lungs/Pleura: No pneumothorax. Areas of linear atelectasis in both lungs. 6 mm pulmonary nodule on the right image 73. 5 mm pulmonary nodule on the right image 87. No nodule seen on the left. These are similar to the study of October 2019 and therefore benign. Musculoskeletal: No rib fracture. Acute fracture of the thoracic spine at T9, possibly due to flexion or extension mechanisms. No displaced bone compromising the spinal canal. CT ABDOMEN PELVIS FINDINGS Hepatobiliary: Liver  parenchyma is normal. Pancreas: Negative Spleen: Negative Adrenals/Urinary Tract: Adrenal glands are normal. Kidneys are normal. Bladder is normal. Stomach/Bowel: No evidence of bowel injury.  No acute bowel finding. Vascular/Lymphatic: Aortic atherosclerosis. No aneurysm. IVC is normal. No adenopathy. Reproductive: No pelvic mass. Other: No free fluid or air. Musculoskeletal: Previous ORIF of left hip fracture. There is either nonunion or recurrent fracture at this site. IMPRESSION: Acute fracture of the thoracic spine at the T9 level which could be due to hyperflexion or hyper extension mechanisms. No evidence of malalignment or bone encroachment upon the canal. Nonunion or recurrent fracture at the site of previous ORIF of the left femur. No abdominal organ injury. Two nodules in the lower right lung, stable since 2019 and therefore benign. Electronically Signed   By: Nelson Chimes M.D.   On: 09/09/2021 16:27   CT T-SPINE NO CHARGE  Result Date: 09/09/2021 CLINICAL DATA:  Trauma EXAM: CT THORACIC AND LUMBAR SPINE WITHOUT CONTRAST TECHNIQUE: Multidetector CT imaging of the thoracic and lumbar spine was performed without contrast. Multiplanar CT image reconstructions were also generated. RADIATION DOSE REDUCTION: This exam was performed according to the departmental dose-optimization program which includes automated exposure control, adjustment of the mA and/or kV according to patient size and/or use of iterative reconstruction technique. COMPARISON:  None Available. FINDINGS: CT THORACIC SPINE FINDINGS Alignment: Normal. Vertebrae: The vertebral bodies are diffusely osteopenic. There are flowing anterior osteophytes throughout the thoracic spine consistent with background diffuse idiopathic skeletal  hyperostosis. There is an acute distracted fracture of the T9 vertebral body with a fracture plane extending through the anterior endplate involving the left aspect of the bridging osteophytes between the T9 and T10  vertebral bodies extending posteriorly to the posterior aspect of the superior endplate. There is no evidence of extension to the posterior elements. There is no bony retropulsion. No other definite fracture is seen within the confines of diffuse osteopenia. There is no suspicious osseous lesion. Paraspinal and other soft tissues: The paraspinal soft tissues are unremarkable. There is no significant prevertebral hematoma. The intrathoracic contents are assessed on the separately dictated CT chest. Disc levels: There is multilevel disc space narrowing with degenerative endplate change and bulky flowing anterior osteophytes throughout the thoracic spine. There is overall mild multilevel facet arthropathy resulting in up to mild-to-moderate bilateral neural foraminal stenosis at T8-T9 and T9-T10. There is no evidence of high-grade osseous spinal canal stenosis. CT LUMBAR SPINE FINDINGS Segmentation: Standard; the lowest formed disc space is designated L5-S1. Alignment: There is grade 1 anterolisthesis of L5 on S1. Alignment is otherwise normal. Vertebrae: Vertebral body heights are preserved. There are flowing anterior osteophytes extending from the lower thoracic spine through the L2 vertebral body consistent with diffuse idiopathic skeletal hyperostosis. There is no definite acute fracture within the confines of diffusely osteopenic bones. There is no suspicious osseous lesion. Paraspinal and other soft tissues: The paraspinal soft tissues are unremarkable. The abdominal and pelvic viscera are assessed on the separately dictated CT abdomen/pelvis. Disc levels: There is marked multilevel disc desiccation and narrowing with multilevel vacuum disc phenomenon. There is advanced multilevel facet arthropathy most severe at L4-L5 and L5-S1. There are disc bulges throughout the lumbar spine resulting in at least mild-to-moderate spinal canal stenosis at L1-L2 and L2-L3. The disc bulges and facet arthropathy results in  mild-to-moderate left neural foraminal stenosis at L3-L4, moderate bilateral neural foraminal stenosis at L4-L5, and severe bilateral neural foraminal stenosis at L5-S1. IMPRESSION: 1. Acute distracted fracture of the T9 vertebral body extending from the anterior endplate involving the bridging osteophyte between the T9 and T10 vertebral bodies posteriorly to the posterior aspect of the superior endplate, superimposed on pre-existing diffuse idiopathic skeletal hyperostosis. No extension to the posterior elements or bony retropulsion. Neurosurgical consultation is recommended. 2. No other definite evidence of acute fracture in the thoracolumbar spine, though evaluation is limited due to significant osteopenia. 3. Multilevel degenerative changes as above. Electronically Signed   By: Valetta Mole M.D.   On: 09/09/2021 16:12   CT L-SPINE NO CHARGE  Result Date: 09/09/2021 CLINICAL DATA:  Trauma EXAM: CT THORACIC AND LUMBAR SPINE WITHOUT CONTRAST TECHNIQUE: Multidetector CT imaging of the thoracic and lumbar spine was performed without contrast. Multiplanar CT image reconstructions were also generated. RADIATION DOSE REDUCTION: This exam was performed according to the departmental dose-optimization program which includes automated exposure control, adjustment of the mA and/or kV according to patient size and/or use of iterative reconstruction technique. COMPARISON:  None Available. FINDINGS: CT THORACIC SPINE FINDINGS Alignment: Normal. Vertebrae: The vertebral bodies are diffusely osteopenic. There are flowing anterior osteophytes throughout the thoracic spine consistent with background diffuse idiopathic skeletal hyperostosis. There is an acute distracted fracture of the T9 vertebral body with a fracture plane extending through the anterior endplate involving the left aspect of the bridging osteophytes between the T9 and T10 vertebral bodies extending posteriorly to the posterior aspect of the superior endplate.  There is no evidence of extension to the posterior elements. There is  no bony retropulsion. No other definite fracture is seen within the confines of diffuse osteopenia. There is no suspicious osseous lesion. Paraspinal and other soft tissues: The paraspinal soft tissues are unremarkable. There is no significant prevertebral hematoma. The intrathoracic contents are assessed on the separately dictated CT chest. Disc levels: There is multilevel disc space narrowing with degenerative endplate change and bulky flowing anterior osteophytes throughout the thoracic spine. There is overall mild multilevel facet arthropathy resulting in up to mild-to-moderate bilateral neural foraminal stenosis at T8-T9 and T9-T10. There is no evidence of high-grade osseous spinal canal stenosis. CT LUMBAR SPINE FINDINGS Segmentation: Standard; the lowest formed disc space is designated L5-S1. Alignment: There is grade 1 anterolisthesis of L5 on S1. Alignment is otherwise normal. Vertebrae: Vertebral body heights are preserved. There are flowing anterior osteophytes extending from the lower thoracic spine through the L2 vertebral body consistent with diffuse idiopathic skeletal hyperostosis. There is no definite acute fracture within the confines of diffusely osteopenic bones. There is no suspicious osseous lesion. Paraspinal and other soft tissues: The paraspinal soft tissues are unremarkable. The abdominal and pelvic viscera are assessed on the separately dictated CT abdomen/pelvis. Disc levels: There is marked multilevel disc desiccation and narrowing with multilevel vacuum disc phenomenon. There is advanced multilevel facet arthropathy most severe at L4-L5 and L5-S1. There are disc bulges throughout the lumbar spine resulting in at least mild-to-moderate spinal canal stenosis at L1-L2 and L2-L3. The disc bulges and facet arthropathy results in mild-to-moderate left neural foraminal stenosis at L3-L4, moderate bilateral neural foraminal  stenosis at L4-L5, and severe bilateral neural foraminal stenosis at L5-S1. IMPRESSION: 1. Acute distracted fracture of the T9 vertebral body extending from the anterior endplate involving the bridging osteophyte between the T9 and T10 vertebral bodies posteriorly to the posterior aspect of the superior endplate, superimposed on pre-existing diffuse idiopathic skeletal hyperostosis. No extension to the posterior elements or bony retropulsion. Neurosurgical consultation is recommended. 2. No other definite evidence of acute fracture in the thoracolumbar spine, though evaluation is limited due to significant osteopenia. 3. Multilevel degenerative changes as above. Electronically Signed   By: Valetta Mole M.D.   On: 09/09/2021 16:12   CT Head Wo Contrast  Result Date: 09/09/2021 CLINICAL DATA:  Head trauma, minor (Age >= 65y); Neck trauma (Age >= 65y) EXAM: CT HEAD WITHOUT CONTRAST CT CERVICAL SPINE WITHOUT CONTRAST TECHNIQUE: Multidetector CT imaging of the head and cervical spine was performed following the standard protocol without intravenous contrast. Multiplanar CT image reconstructions of the cervical spine were also generated. RADIATION DOSE REDUCTION: This exam was performed according to the departmental dose-optimization program which includes automated exposure control, adjustment of the mA and/or kV according to patient size and/or use of iterative reconstruction technique. COMPARISON:  None Available. FINDINGS: CT HEAD FINDINGS Brain: No evidence of acute infarction, hemorrhage, hydrocephalus, extra-axial collection or mass lesion/mass effect. Vascular: Calcific intracranial atherosclerosis. Skull: No acute fracture. Sinuses/Orbits: Mild paranasal sinus mucosal thickening. No acute orbital findings. Other: No mastoid effusions. CT CERVICAL SPINE FINDINGS Alignment: Straightening.  No substantial sagittal subluxation. Skull base and vertebrae: Vertebral body heights are maintained. No evidence of acute  fracture. C4-C5 ACDF with evidence of some bony fusion across the disc space. Soft tissues and spinal canal: No prevertebral fluid or swelling. No visible canal hematoma. Disc levels: Moderate to severe multilevel degenerative disc disease and facet/uncovertebral hypertrophy with varying degrees of neural foraminal stenosis. Severe craniocervical degenerative change including bony remodeling. Upper chest: Evaluated on concurrent chest abdomen/pelvis. IMPRESSION:  1. No evidence of acute intracranial abnormality. 2. No evidence of acute fracture or traumatic malalignment in the cervical spine. Electronically Signed   By: Margaretha Sheffield M.D.   On: 09/09/2021 15:37   CT Cervical Spine Wo Contrast  Result Date: 09/09/2021 CLINICAL DATA:  Head trauma, minor (Age >= 65y); Neck trauma (Age >= 65y) EXAM: CT HEAD WITHOUT CONTRAST CT CERVICAL SPINE WITHOUT CONTRAST TECHNIQUE: Multidetector CT imaging of the head and cervical spine was performed following the standard protocol without intravenous contrast. Multiplanar CT image reconstructions of the cervical spine were also generated. RADIATION DOSE REDUCTION: This exam was performed according to the departmental dose-optimization program which includes automated exposure control, adjustment of the mA and/or kV according to patient size and/or use of iterative reconstruction technique. COMPARISON:  None Available. FINDINGS: CT HEAD FINDINGS Brain: No evidence of acute infarction, hemorrhage, hydrocephalus, extra-axial collection or mass lesion/mass effect. Vascular: Calcific intracranial atherosclerosis. Skull: No acute fracture. Sinuses/Orbits: Mild paranasal sinus mucosal thickening. No acute orbital findings. Other: No mastoid effusions. CT CERVICAL SPINE FINDINGS Alignment: Straightening.  No substantial sagittal subluxation. Skull base and vertebrae: Vertebral body heights are maintained. No evidence of acute fracture. C4-C5 ACDF with evidence of some bony fusion  across the disc space. Soft tissues and spinal canal: No prevertebral fluid or swelling. No visible canal hematoma. Disc levels: Moderate to severe multilevel degenerative disc disease and facet/uncovertebral hypertrophy with varying degrees of neural foraminal stenosis. Severe craniocervical degenerative change including bony remodeling. Upper chest: Evaluated on concurrent chest abdomen/pelvis. IMPRESSION: 1. No evidence of acute intracranial abnormality. 2. No evidence of acute fracture or traumatic malalignment in the cervical spine. Electronically Signed   By: Margaretha Sheffield M.D.   On: 09/09/2021 15:37   DG Pelvis Portable  Result Date: 09/09/2021 CLINICAL DATA:  A 76 year old female presents following fall, patient is on blood thinners. EXAM: PORTABLE PELVIS 1-2 VIEWS COMPARISON:  August 27, 2021. FINDINGS: This study is markedly limited by patient body habitus. Scattered loops of gas and stool filled bowel project over the lower abdomen and over the pelvis. These bowel loops limited pelvic assessment. No displaced fracture or destructive bone lesion of the pelvis accounting for limitations on the current study. Degenerative changes are present in the bilateral hips. Evidence of prior ORIF with femoral nailing on the LEFT. The distal aspect of the LEFT femoral nail is not assessed as it passes beyond the mid shaft of the femur. Callus formation at the site of prior fracture in the intratrochanteric LEFT femur is similar to previous imaging. Soft tissues are otherwise unremarkable. Degenerative changes are noted in the spine. IMPRESSION: 1. Markedly limited study due to patient body habitus. 2. No displaced fracture or destructive bone lesion of the pelvis. 3. Evidence of prior ORIF on the LEFT with femoral nailing, femoral nail extends beyond the mid femur. 4. Callus formation at the site of prior fracture in the intratrochanteric LEFT femur is similar to previous imaging. Electronically Signed   By:  Zetta Bills M.D.   On: 09/09/2021 14:52   DG Chest Port 1 View  Result Date: 09/09/2021 CLINICAL DATA:  76 year old female presenting with fall on blood thinners. EXAM: PORTABLE CHEST 1 VIEW COMPARISON:  July 15, 2021. FINDINGS: EKG leads project over the chest.  Post median sternotomy for CABG. RIGHT sided multi lead pacer defibrillator remains in place. Cardiomediastinal contours and hilar structures are stable with signs of median sternotomy for CABG. No lobar consolidation.  No sign of pneumothorax. On limited  assessment there is no acute skeletal finding. IMPRESSION: No acute radiographic findings in the chest. Electronically Signed   By: Zetta Bills M.D.   On: 09/09/2021 14:49     Assessment and Plan:   SYLVAN LAHM is a 76 y.o. female with a hx of CAD status post CABG ' 16 (LIMA to LAD, SVG to D1, SVG to OM1 and OM2, SVG to PDA), HFpEF, adrenal insufficiency, type 2 diabetes, hypertension, CKD stage III, OSA, asthma, CHB s/p PPM, carotid artery disease s/p TCAR and morbid obesity who is being seen 09/10/2021 for the evaluation of preoperative evaluation at the request of Dr. Bonner Puna.  Fall T9-T10 fracture Pre op Evaluation HFmrEF -- seen by neurosurgery with plans for OR this afternoon.  Unable to wait for Plavix washout per neurosurgery. -- Echocardiogram 08/05/21 showed LVEF of 45 to 50%, apical hypokinesis, normal RV size and function, mildly elevated pulmonary artery pressure.  She lives with some degree of lower extremity edema at all times.  BNP in the 500 range which is improved from her recent office visit on 7/24 where it was 800.  She reports her weight has been trending down.  Chest x-ray without edema on admission.  -- She denies any chest pain prior to admission, actually does not appear volume overloaded. Weight is down 3lbs from recent office visit.  -- would advise continuation of her home dose of Lasix of 160 mg daily -- it is understood that the patient will require  surgery, though she is at higher risk given her co-morbidities. Do not anticipate any cardiac studies prior surgery that would negate this risk   CAD s/p CABG (LIMA to LAD, SVG to D1, SVG to OM1 and OM2, SVG to PDA) --Denies any chest pain prior to admission --Continue aspirin, Imdur, hydralazine 25 mg 3 times daily, Toprol XL 50 mg daily -- plavix resumption per neurosurgery, would like to continue on ASA through periop period  CKD stage IV: Baseline creatinine around 2.7-3.5, noted at 4.9  on admission, has improved to 3.9 since admission --Follows with nephrology as an outpatient  CHB s/p PPM: Last device check at office visit on 7/24 showed stable device function.  V paced on telemetry  Chronic hypoxic respiratory failure OSA --On chronic home O2  Iatrogenic adrenal insufficiency Obesity DM  Risk Assessment/Risk Scores:      New York Heart Association (NYHA) Functional Class NYHA Class II   For questions or updates, please contact [facility] Please consult [email or web address] for contact info under    Signed, Alicia Breeding, MD  09/10/2021 2:49 PM   History and all data above reviewed.  Patient examined.  I agree with the findings as above.   The patient presents after fall out of her chair.  She has chronic cardiovascular issues as above.  She has chronic O2 dependent hypoxemia.  She is minimally ambulatory with walkers in her house.  She has not had any acute unstable symptoms recently.  She has had no new acute PND or orthopnea.  She thinks her lower extremity swelling is less than it has been previously.  She gets occasional chest discomfort that she thinks is her reflux and has not had to use nitroglycerin for had any accelerated pattern.  She has not had any palpitations or presyncope.  The patient exam reveals COR: Regular rate and rhythm, soft apical systolic murmur nonradiating, no diastolic,  Lungs: Clear to auscultation bilaterally,  Abd: Positive bowel sounds normal  frequency and pitch, Ext mild  lower extremity swelling.  All available labs, radiology testing, previous records reviewed. Agree with documented assessment and plan.  Preoperative evaluation: The patient is at least moderately high risk because of the chronic issues low functional status.  However, none of it is absolutely prohibitive.  She understands that there are no tests or studies but will mitigate this risk and that her surgery is necessary.  She is willing to accept this risk and we will closely follow her for her volume status in particular as well as any evidence of unstable cardiovascular issues.  The patient states she is probably bleeding risk given the fact that we will have the opportunity from Plavix to washout.    [name]  2:50 PM  09/10/2021

## 2021-09-11 ENCOUNTER — Inpatient Hospital Stay (HOSPITAL_COMMUNITY): Payer: Medicare Other

## 2021-09-11 DIAGNOSIS — N179 Acute kidney failure, unspecified: Secondary | ICD-10-CM | POA: Diagnosis not present

## 2021-09-11 DIAGNOSIS — S22079A Unspecified fracture of T9-T10 vertebra, initial encounter for closed fracture: Secondary | ICD-10-CM | POA: Diagnosis not present

## 2021-09-11 DIAGNOSIS — I5032 Chronic diastolic (congestive) heart failure: Secondary | ICD-10-CM | POA: Diagnosis not present

## 2021-09-11 DIAGNOSIS — Z950 Presence of cardiac pacemaker: Secondary | ICD-10-CM | POA: Diagnosis not present

## 2021-09-11 LAB — CBC WITH DIFFERENTIAL/PLATELET
Abs Immature Granulocytes: 0.07 10*3/uL (ref 0.00–0.07)
Basophils Absolute: 0 10*3/uL (ref 0.0–0.1)
Basophils Relative: 0 %
Eosinophils Absolute: 0 10*3/uL (ref 0.0–0.5)
Eosinophils Relative: 0 %
HCT: 27.1 % — ABNORMAL LOW (ref 36.0–46.0)
Hemoglobin: 8.1 g/dL — ABNORMAL LOW (ref 12.0–15.0)
Immature Granulocytes: 1 %
Lymphocytes Relative: 6 %
Lymphs Abs: 0.4 10*3/uL — ABNORMAL LOW (ref 0.7–4.0)
MCH: 29.9 pg (ref 26.0–34.0)
MCHC: 29.9 g/dL — ABNORMAL LOW (ref 30.0–36.0)
MCV: 100 fL (ref 80.0–100.0)
Monocytes Absolute: 0.4 10*3/uL (ref 0.1–1.0)
Monocytes Relative: 5 %
Neutro Abs: 7.1 10*3/uL (ref 1.7–7.7)
Neutrophils Relative %: 88 %
Platelets: 149 10*3/uL — ABNORMAL LOW (ref 150–400)
RBC: 2.71 MIL/uL — ABNORMAL LOW (ref 3.87–5.11)
RDW: 13 % (ref 11.5–15.5)
WBC: 8 10*3/uL (ref 4.0–10.5)
nRBC: 0 % (ref 0.0–0.2)

## 2021-09-11 LAB — COMPREHENSIVE METABOLIC PANEL
ALT: 65 U/L — ABNORMAL HIGH (ref 0–44)
AST: 55 U/L — ABNORMAL HIGH (ref 15–41)
Albumin: 3.7 g/dL (ref 3.5–5.0)
Alkaline Phosphatase: 84 U/L (ref 38–126)
Anion gap: 8 (ref 5–15)
BUN: 92 mg/dL — ABNORMAL HIGH (ref 8–23)
CO2: 32 mmol/L (ref 22–32)
Calcium: 9.5 mg/dL (ref 8.9–10.3)
Chloride: 100 mmol/L (ref 98–111)
Creatinine, Ser: 3.18 mg/dL — ABNORMAL HIGH (ref 0.44–1.00)
GFR, Estimated: 14 mL/min — ABNORMAL LOW (ref >60)
Glucose, Bld: 173 mg/dL — ABNORMAL HIGH (ref 70–99)
Potassium: 3.9 mmol/L (ref 3.5–5.1)
Sodium: 140 mmol/L (ref 135–145)
Total Bilirubin: 0.6 mg/dL (ref 0.3–1.2)
Total Protein: 6.4 g/dL — ABNORMAL LOW (ref 6.5–8.1)

## 2021-09-11 LAB — LIPASE, BLOOD: Lipase: 38 U/L (ref 11–51)

## 2021-09-11 LAB — LACTIC ACID, PLASMA: Lactic Acid, Venous: 1.1 mmol/L (ref 0.5–1.9)

## 2021-09-11 MED ORDER — FUROSEMIDE 40 MG PO TABS
40.0000 mg | ORAL_TABLET | ORAL | Status: DC
Start: 2021-09-11 — End: 2021-09-18
  Administered 2021-09-11 – 2021-09-16 (×6): 40 mg via ORAL
  Filled 2021-09-11 (×6): qty 1

## 2021-09-11 MED ORDER — SENNOSIDES-DOCUSATE SODIUM 8.6-50 MG PO TABS
1.0000 | ORAL_TABLET | Freq: Every day | ORAL | Status: DC
  Administered 2021-09-11 – 2021-09-12 (×2): 1 via ORAL
  Filled 2021-09-11 (×2): qty 1

## 2021-09-11 MED ORDER — FUROSEMIDE 40 MG PO TABS
80.0000 mg | ORAL_TABLET | Freq: Every day | ORAL | Status: DC
  Administered 2021-09-12 – 2021-09-17 (×6): 80 mg via ORAL
  Filled 2021-09-11 (×6): qty 2

## 2021-09-11 MED ORDER — HYDROMORPHONE HCL 1 MG/ML IJ SOLN
0.5000 mg | INTRAMUSCULAR | Status: DC | PRN
  Administered 2021-09-11 (×2): 0.5 mg via INTRAVENOUS
  Filled 2021-09-11 (×2): qty 0.5

## 2021-09-11 MED ORDER — HYDROMORPHONE HCL 1 MG/ML IJ SOLN
0.5000 mg | INTRAMUSCULAR | Status: DC | PRN
  Administered 2021-09-11 – 2021-09-12 (×9): 0.5 mg via INTRAVENOUS
  Filled 2021-09-11 (×9): qty 0.5

## 2021-09-11 MED ORDER — ONDANSETRON HCL 4 MG/2ML IJ SOLN
4.0000 mg | Freq: Four times a day (QID) | INTRAMUSCULAR | Status: DC | PRN
Start: 2021-09-11 — End: 2021-09-18
  Administered 2021-09-11 – 2021-09-13 (×2): 4 mg via INTRAVENOUS
  Filled 2021-09-11 (×2): qty 2

## 2021-09-11 NOTE — Progress Notes (Signed)
Inpatient Rehab Admissions Coordinator:  ? ?Per therapy recommendations,  patient was screened for CIR candidacy by Bart Ashford, MS, CCC-SLP. At this time, Pt. Appears to be a a potential candidate for CIR. I will place   order for rehab consult per protocol for full assessment. Please contact me any with questions. ? ?Mollyann Halbert, MS, CCC-SLP ?Rehab Admissions Coordinator  ?[phone] (celll) ?[phone] (office) ? ?

## 2021-09-11 NOTE — Progress Notes (Signed)
Pt returned from OR, alert and oriented, skin w&d, resp even & unlabored. surgical site to upper mid back, clean and intact, no drainage noted. Pt voices no concerns at this time.

## 2021-09-11 NOTE — Progress Notes (Signed)
[facility] Progress Note   76 y.o. female hx of CRT-P, CASHD w/ CABG in 2016, CHF, fatty liver, Gout, HL, HTN, IBS, NICM, obestiy, OSA, sp PPM, hx CVA and DM2 who presented to ED after falling and landing on her buttocks + forehead, found to have a T9-10 fracture s/p T7-T11 pedicle screw to stabilize on 8/1.   ATN requiring  iHD then CRRT then iHD again after lip hip surgery earlier this year through most of May. She then showed improved creatinine into the 2's and was taken off of HD and her [facility] was removed  early June.   She was also recently admitted at [facility] 08/04/21 -08/15/21 with vol overload  w/ 3 days of SOB/ DOE and worsening LE edema. In ED found to have bilat LE cellulitis and edema w/ some amount of fluid by CXR. Pt admitted for diuresis and IV abx with diuresis of 12.8 L UOP over 7 days .Scr was 3.06 at time of d/c. She has also been seen by Cards in late July but  unable to accelerate diuresis due to her kidney disease. Scr 09/02/21 SL . worse  at 4.0, GFR 10.66 with Normal K.    She now lives in [place] area so [facility] instead of [facility] for HD if needed.   She denies CP, HA, blurry vision, rash, diarrhea, nausea/ vomiting, dysuria, or difficulty voiding. States she has a lot of UOP with the Lasix.  Assessment/ Plan:   CKD IV - b/l creat 2.7- 3.2, eGFR 14- 16. Pt was on HD from feb- late May 2023, now admitted w/ fall with noted T9-10 fracture requiring surgery. She's certainly in positive sodium balance but has been responding to the Lasix 80 mg BID. Renal function has worsened over the past month but remains in the non-dialysis range. She has been followed at [place].  - Will follow closely with you; no absolute indication for RRT and the patient appears to be  comfortable. Fortunately renal function is improving.  - Her baseline appears to be in the 2.7-3.2 range; restart Lasix at 80/40 given history of response at home with 40 bid which lead to positive sodium balance.  Recently here with volume overload and cardiac status is tenuous with dCHF.She understands how to titrate at home based on her AM weights.  -Monitor Daily I/Os, Daily weight  -Maintain MAP>65 for optimal renal perfusion.  -Avoid nephrotoxic medications including NSAIDs   Signing off at this time; please reconsult as needed.   T9 Verterbral fracture s/p T7-T11 pedicle screw to stabilize on 8/1.  COPD on chronic prednisone and breathing treatments + O2. Obesity DM2  HTN - cont meds H/o CASHD no active CP. Gout  Subjective:   Feels comfortable and denies sob/ f/c/n/v.   Objective:   BP (!) 149/77 (BP Location: Left Arm)   Pulse 79   Temp 98.2 F (36.8 C) (Oral)   Resp 19   Ht 5' 3"  (1.6 m)   Wt 89.8 kg   SpO2 96%   BMI 35.07 kg/m   Intake/Output Summary (Last 24 hours) at 09/11/2021 1450 Last data filed at 09/11/2021 0600 Gross per 24 hour  Intake 2550 ml  Output 800 ml  Net 1750 ml   Weight change:   Physical Exam: Gen alert, no distress No jvd or bruits Chest clear bilat to bases RRR no MRG Abd soft ntnd no mass or ascites +bs GU normal MS no joint effusions or deformity Ext tr  LE or  UE edema, no wounds or ulcers Neuro is alert, Ox 3 , nf  Imaging: DG Thoracic Spine 1 View  Result Date: 09/10/2021 CLINICAL DATA:  T7-T11 screw placement. EXAM: OPERATIVE THORACIC SPINE 1 VIEW(S) COMPARISON:  Thoracic spine CT 09/09/2021 FINDINGS: Cross-table lateral view of the thoracic spine demonstrates pedicle screws at T7-T11. IMPRESSION: As above. Electronically Signed   By: Ronney Asters M.D.   On: 09/10/2021 20:11   DG O-ARM IMAGE ONLY/NO REPORT  Result Date: 09/10/2021 There is no Radiologist interpretation  for this exam.  CT HIP LEFT WO CONTRAST  Result Date: 09/10/2021 CLINICAL DATA:  Hip trauma, fracture suspected, xray done previous ORIF to left hip, questionable left hip non-union vs recurrent fracture see on CT chest EXAM: CT OF THE LEFT HIP WITHOUT CONTRAST  TECHNIQUE: Multidetector CT imaging of the left hip was performed according to the standard protocol. Multiplanar CT image reconstructions were also generated. RADIATION DOSE REDUCTION: This exam was performed according to the departmental dose-optimization program which includes automated exposure control, adjustment of the mA and/or kV according to patient size and/or use of iterative reconstruction technique. COMPARISON:  CT 09/09/2021 FINDINGS: Bones/Joint/Cartilage Prior intramedullary narrowing of the left femur with distal interlocking screw for a comminuted intertrochanteric fracture. Fracture fragments are in a similar configuration without evidence of new fracture. There is callus formation without any bony bridging. There is soft tissue thickening and fluid along the fracture site. The distal interlocking screws are intact. There is sclerosis of the femoral head which is increased since March 2023, potentially reflecting developing AVN. No articular surface collapse. There is mild to moderate left hip osteoarthritis. Ligaments Suboptimally assessed by CT. Muscles and Tendons High-grade gluteal and hamstring muscle atrophy. No intramuscular collection. No acute myotendinous abnormality by CT. Soft tissues No focal fluid collection.  Colonic diverticulosis. IMPRESSION: Prior intramedullary nailing of the femur for a comminuted intertrochanteric fracture. There is callus formation without any significant bony bridging, soft tissue thickening and fluid at the fracture site suggesting nonunion. No evidence of new fracture. Electronically Signed   By: Maurine Simmering M.D.   On: 09/10/2021 08:34   CT CHEST ABDOMEN PELVIS WO CONTRAST  Result Date: 09/09/2021 CLINICAL DATA:  Blunt trauma EXAM: CT CHEST, ABDOMEN AND PELVIS WITHOUT CONTRAST TECHNIQUE: Multidetector CT imaging of the chest, abdomen and pelvis was performed following the standard protocol without IV contrast. RADIATION DOSE REDUCTION: This exam was  performed according to the departmental dose-optimization program which includes automated exposure control, adjustment of the mA and/or kV according to patient size and/or use of iterative reconstruction technique. COMPARISON:  Chest radiography same day.  CT 11/20/2017. FINDINGS: CT CHEST FINDINGS Cardiovascular: Pacemaker. Cardiomegaly. Coronary artery calcification and aortic atherosclerotic calcification. Mediastinum/Nodes: No evidence of mediastinal bleeding. No adenopathy or mass. Lungs/Pleura: No pneumothorax. Areas of linear atelectasis in both lungs. 6 mm pulmonary nodule on the right image 73. 5 mm pulmonary nodule on the right image 87. No nodule seen on the left. These are similar to the study of October 2019 and therefore benign. Musculoskeletal: No rib fracture. Acute fracture of the thoracic spine at T9, possibly due to flexion or extension mechanisms. No displaced bone compromising the spinal canal. CT ABDOMEN PELVIS FINDINGS Hepatobiliary: Liver parenchyma is normal. Pancreas: Negative Spleen: Negative Adrenals/Urinary Tract: Adrenal glands are normal. Kidneys are normal. Bladder is normal. Stomach/Bowel: No evidence of bowel injury.  No acute bowel finding. Vascular/Lymphatic: Aortic atherosclerosis. No aneurysm. IVC is normal. No adenopathy. Reproductive: No pelvic mass. Other: No free  fluid or air. Musculoskeletal: Previous ORIF of left hip fracture. There is either nonunion or recurrent fracture at this site. IMPRESSION: Acute fracture of the thoracic spine at the T9 level which could be due to hyperflexion or hyper extension mechanisms. No evidence of malalignment or bone encroachment upon the canal. Nonunion or recurrent fracture at the site of previous ORIF of the left femur. No abdominal organ injury. Two nodules in the lower right lung, stable since 2019 and therefore benign. Electronically Signed   By: Nelson Chimes M.D.   On: 09/09/2021 16:27   CT T-SPINE NO CHARGE  Result Date:  09/09/2021 CLINICAL DATA:  Trauma EXAM: CT THORACIC AND LUMBAR SPINE WITHOUT CONTRAST TECHNIQUE: Multidetector CT imaging of the thoracic and lumbar spine was performed without contrast. Multiplanar CT image reconstructions were also generated. RADIATION DOSE REDUCTION: This exam was performed according to the departmental dose-optimization program which includes automated exposure control, adjustment of the mA and/or kV according to patient size and/or use of iterative reconstruction technique. COMPARISON:  None Available. FINDINGS: CT THORACIC SPINE FINDINGS Alignment: Normal. Vertebrae: The vertebral bodies are diffusely osteopenic. There are flowing anterior osteophytes throughout the thoracic spine consistent with background diffuse idiopathic skeletal hyperostosis. There is an acute distracted fracture of the T9 vertebral body with a fracture plane extending through the anterior endplate involving the left aspect of the bridging osteophytes between the T9 and T10 vertebral bodies extending posteriorly to the posterior aspect of the superior endplate. There is no evidence of extension to the posterior elements. There is no bony retropulsion. No other definite fracture is seen within the confines of diffuse osteopenia. There is no suspicious osseous lesion. Paraspinal and other soft tissues: The paraspinal soft tissues are unremarkable. There is no significant prevertebral hematoma. The intrathoracic contents are assessed on the separately dictated CT chest. Disc levels: There is multilevel disc space narrowing with degenerative endplate change and bulky flowing anterior osteophytes throughout the thoracic spine. There is overall mild multilevel facet arthropathy resulting in up to mild-to-moderate bilateral neural foraminal stenosis at T8-T9 and T9-T10. There is no evidence of high-grade osseous spinal canal stenosis. CT LUMBAR SPINE FINDINGS Segmentation: Standard; the lowest formed disc space is designated  L5-S1. Alignment: There is grade 1 anterolisthesis of L5 on S1. Alignment is otherwise normal. Vertebrae: Vertebral body heights are preserved. There are flowing anterior osteophytes extending from the lower thoracic spine through the L2 vertebral body consistent with diffuse idiopathic skeletal hyperostosis. There is no definite acute fracture within the confines of diffusely osteopenic bones. There is no suspicious osseous lesion. Paraspinal and other soft tissues: The paraspinal soft tissues are unremarkable. The abdominal and pelvic viscera are assessed on the separately dictated CT abdomen/pelvis. Disc levels: There is marked multilevel disc desiccation and narrowing with multilevel vacuum disc phenomenon. There is advanced multilevel facet arthropathy most severe at L4-L5 and L5-S1. There are disc bulges throughout the lumbar spine resulting in at least mild-to-moderate spinal canal stenosis at L1-L2 and L2-L3. The disc bulges and facet arthropathy results in mild-to-moderate left neural foraminal stenosis at L3-L4, moderate bilateral neural foraminal stenosis at L4-L5, and severe bilateral neural foraminal stenosis at L5-S1. IMPRESSION: 1. Acute distracted fracture of the T9 vertebral body extending from the anterior endplate involving the bridging osteophyte between the T9 and T10 vertebral bodies posteriorly to the posterior aspect of the superior endplate, superimposed on pre-existing diffuse idiopathic skeletal hyperostosis. No extension to the posterior elements or bony retropulsion. Neurosurgical consultation is recommended. 2. No other  definite evidence of acute fracture in the thoracolumbar spine, though evaluation is limited due to significant osteopenia. 3. Multilevel degenerative changes as above. Electronically Signed   By: Valetta Mole M.D.   On: 09/09/2021 16:12   CT L-SPINE NO CHARGE  Result Date: 09/09/2021 CLINICAL DATA:  Trauma EXAM: CT THORACIC AND LUMBAR SPINE WITHOUT CONTRAST  TECHNIQUE: Multidetector CT imaging of the thoracic and lumbar spine was performed without contrast. Multiplanar CT image reconstructions were also generated. RADIATION DOSE REDUCTION: This exam was performed according to the departmental dose-optimization program which includes automated exposure control, adjustment of the mA and/or kV according to patient size and/or use of iterative reconstruction technique. COMPARISON:  None Available. FINDINGS: CT THORACIC SPINE FINDINGS Alignment: Normal. Vertebrae: The vertebral bodies are diffusely osteopenic. There are flowing anterior osteophytes throughout the thoracic spine consistent with background diffuse idiopathic skeletal hyperostosis. There is an acute distracted fracture of the T9 vertebral body with a fracture plane extending through the anterior endplate involving the left aspect of the bridging osteophytes between the T9 and T10 vertebral bodies extending posteriorly to the posterior aspect of the superior endplate. There is no evidence of extension to the posterior elements. There is no bony retropulsion. No other definite fracture is seen within the confines of diffuse osteopenia. There is no suspicious osseous lesion. Paraspinal and other soft tissues: The paraspinal soft tissues are unremarkable. There is no significant prevertebral hematoma. The intrathoracic contents are assessed on the separately dictated CT chest. Disc levels: There is multilevel disc space narrowing with degenerative endplate change and bulky flowing anterior osteophytes throughout the thoracic spine. There is overall mild multilevel facet arthropathy resulting in up to mild-to-moderate bilateral neural foraminal stenosis at T8-T9 and T9-T10. There is no evidence of high-grade osseous spinal canal stenosis. CT LUMBAR SPINE FINDINGS Segmentation: Standard; the lowest formed disc space is designated L5-S1. Alignment: There is grade 1 anterolisthesis of L5 on S1. Alignment is otherwise  normal. Vertebrae: Vertebral body heights are preserved. There are flowing anterior osteophytes extending from the lower thoracic spine through the L2 vertebral body consistent with diffuse idiopathic skeletal hyperostosis. There is no definite acute fracture within the confines of diffusely osteopenic bones. There is no suspicious osseous lesion. Paraspinal and other soft tissues: The paraspinal soft tissues are unremarkable. The abdominal and pelvic viscera are assessed on the separately dictated CT abdomen/pelvis. Disc levels: There is marked multilevel disc desiccation and narrowing with multilevel vacuum disc phenomenon. There is advanced multilevel facet arthropathy most severe at L4-L5 and L5-S1. There are disc bulges throughout the lumbar spine resulting in at least mild-to-moderate spinal canal stenosis at L1-L2 and L2-L3. The disc bulges and facet arthropathy results in mild-to-moderate left neural foraminal stenosis at L3-L4, moderate bilateral neural foraminal stenosis at L4-L5, and severe bilateral neural foraminal stenosis at L5-S1. IMPRESSION: 1. Acute distracted fracture of the T9 vertebral body extending from the anterior endplate involving the bridging osteophyte between the T9 and T10 vertebral bodies posteriorly to the posterior aspect of the superior endplate, superimposed on pre-existing diffuse idiopathic skeletal hyperostosis. No extension to the posterior elements or bony retropulsion. Neurosurgical consultation is recommended. 2. No other definite evidence of acute fracture in the thoracolumbar spine, though evaluation is limited due to significant osteopenia. 3. Multilevel degenerative changes as above. Electronically Signed   By: Valetta Mole M.D.   On: 09/09/2021 16:12   CT Head Wo Contrast  Result Date: 09/09/2021 CLINICAL DATA:  Head trauma, minor (Age >= 65y); Neck trauma (Age >=  65y) EXAM: CT HEAD WITHOUT CONTRAST CT CERVICAL SPINE WITHOUT CONTRAST TECHNIQUE: Multidetector CT  imaging of the head and cervical spine was performed following the standard protocol without intravenous contrast. Multiplanar CT image reconstructions of the cervical spine were also generated. RADIATION DOSE REDUCTION: This exam was performed according to the departmental dose-optimization program which includes automated exposure control, adjustment of the mA and/or kV according to patient size and/or use of iterative reconstruction technique. COMPARISON:  None Available. FINDINGS: CT HEAD FINDINGS Brain: No evidence of acute infarction, hemorrhage, hydrocephalus, extra-axial collection or mass lesion/mass effect. Vascular: Calcific intracranial atherosclerosis. Skull: No acute fracture. Sinuses/Orbits: Mild paranasal sinus mucosal thickening. No acute orbital findings. Other: No mastoid effusions. CT CERVICAL SPINE FINDINGS Alignment: Straightening.  No substantial sagittal subluxation. Skull base and vertebrae: Vertebral body heights are maintained. No evidence of acute fracture. C4-C5 ACDF with evidence of some bony fusion across the disc space. Soft tissues and spinal canal: No prevertebral fluid or swelling. No visible canal hematoma. Disc levels: Moderate to severe multilevel degenerative disc disease and facet/uncovertebral hypertrophy with varying degrees of neural foraminal stenosis. Severe craniocervical degenerative change including bony remodeling. Upper chest: Evaluated on concurrent chest abdomen/pelvis. IMPRESSION: 1. No evidence of acute intracranial abnormality. 2. No evidence of acute fracture or traumatic malalignment in the cervical spine. Electronically Signed   By: Margaretha Sheffield M.D.   On: 09/09/2021 15:37   CT Cervical Spine Wo Contrast  Result Date: 09/09/2021 CLINICAL DATA:  Head trauma, minor (Age >= 65y); Neck trauma (Age >= 65y) EXAM: CT HEAD WITHOUT CONTRAST CT CERVICAL SPINE WITHOUT CONTRAST TECHNIQUE: Multidetector CT imaging of the head and cervical spine was performed  following the standard protocol without intravenous contrast. Multiplanar CT image reconstructions of the cervical spine were also generated. RADIATION DOSE REDUCTION: This exam was performed according to the departmental dose-optimization program which includes automated exposure control, adjustment of the mA and/or kV according to patient size and/or use of iterative reconstruction technique. COMPARISON:  None Available. FINDINGS: CT HEAD FINDINGS Brain: No evidence of acute infarction, hemorrhage, hydrocephalus, extra-axial collection or mass lesion/mass effect. Vascular: Calcific intracranial atherosclerosis. Skull: No acute fracture. Sinuses/Orbits: Mild paranasal sinus mucosal thickening. No acute orbital findings. Other: No mastoid effusions. CT CERVICAL SPINE FINDINGS Alignment: Straightening.  No substantial sagittal subluxation. Skull base and vertebrae: Vertebral body heights are maintained. No evidence of acute fracture. C4-C5 ACDF with evidence of some bony fusion across the disc space. Soft tissues and spinal canal: No prevertebral fluid or swelling. No visible canal hematoma. Disc levels: Moderate to severe multilevel degenerative disc disease and facet/uncovertebral hypertrophy with varying degrees of neural foraminal stenosis. Severe craniocervical degenerative change including bony remodeling. Upper chest: Evaluated on concurrent chest abdomen/pelvis. IMPRESSION: 1. No evidence of acute intracranial abnormality. 2. No evidence of acute fracture or traumatic malalignment in the cervical spine. Electronically Signed   By: Margaretha Sheffield M.D.   On: 09/09/2021 15:37    Labs: BMET Recent Labs  Lab 09/09/21 1438 09/09/21 1443 09/10/21 0235 09/11/21 0937  NA 134* 132* 138 140  K 3.9 3.9 3.2* 3.9  CL 91* 92* 95* 100  CO2 29  --  31 32  GLUCOSE 179* 176* 109* 173*  BUN 118* >130* 109* 92*  CREATININE 4.75* 4.90* 3.99* 3.18*  CALCIUM 9.3  --  9.6 9.5   CBC Recent Labs  Lab  09/09/21 1438 09/09/21 1443 09/10/21 0235 09/11/21 0937  WBC 6.5  --  6.4 8.0  NEUTROABS  --   --   --  7.1  HGB 8.5* 9.2* 8.0* 8.1*  HCT 26.7* 27.0* 25.6* 27.1*  MCV 97.1  --  97.0 100.0  PLT 177  --  161 149*    Medications:     allopurinol  100 mg Oral Daily   aspirin EC  81 mg Oral Daily   clopidogrel  75 mg Oral Daily   cyanocobalamin  1,000 mcg Oral Daily   cycloSPORINE  1 drop Both Eyes BID   hydrALAZINE  25 mg Oral Q8H   isosorbide mononitrate  90 mg Oral Daily   metoprolol succinate  50 mg Oral Daily   mometasone-formoterol  2 puff Inhalation BID   pantoprazole  40 mg Oral BID   potassium chloride  20 mEq Oral Q1500   predniSONE  10 mg Oral Q breakfast   rosuvastatin  20 mg Oral QPM   sucralfate  2 g Oral BID      Otelia Santee, MD 09/11/2021, 2:50 PM

## 2021-09-11 NOTE — Progress Notes (Signed)
Progress Note  Patient: [name] IWP:[id number] DOB: July 31, 1945  DOA: 09/09/2021  DOS: 09/11/2021    Brief hospital course: SKILER TYE is a 76 y.o. female with medical history significant of CAD s/p CABG in 2016, HFpEF,s/p CRT-pacemaker, chronic hypoxemic respiratory failure on 2.5 to 3 L, adrenal insufficiency, T2DM, HTN, CKD stage 4, OSA, asthma s/p fall.   Pt was walking with walker and fell when she turned landing on her buttocks and hit her forehead. She is on Plavix. She denies any lightheadedness or dizziness. No chest pain or shortness of breath.    She was found to have an unstable at T9-10 fracture and neurosurgery has deemed it an unstable fracture and will need OR tomorrow.  There is also concerns of nonunion or recurrent fracture at the site of the previous ORIF of the left femur. Family is concerned because of her heart and kidney disease. She was seen earlier this year for hip fracture and postoperatively had ATN briefly requiring dialysis. Access was later removed. However pt tells me that she had follow with nephrology last week and was told she now needs dialysis indefinitely.  She is awaiting callback on next steps.   She currently has AKI with creatinine of 4.75 up from 4.03.  She takes 160 mg of furosemide daily and still appears hypervolemic on exam with lower extremity edema although she reports this is no worse than usual.  She remained on her home 2.5-3 L O2.  Assessment and Plan: Unstable T9 vertebral fracture:  - Neurosurgery consulted, planning percutaneous instrumentation.  - Pain control inadequate, will give dilaudid IV prn. Since likely to need opioids, will start regular and prn bowel regimen.  - Postoperative PT evaluation > CIR recommended, consulted PM&R per protocol.   Stage IV CKD: Pt reports she's told she'll be headed toward dialysis shortly, has no access currently. No acute indications for dialysis.  - Appreciate nephrology following  closely. SCr stable, improved. - Renal diet  Chronic HFmrEF: Recent echo with LVEF 45-50%, apical hypokinesis, normal RV size and function, mildly elevated PASP.  - Appreciate nephrology, cardiology recommendations Re: diuretic dosing. Giving lasix '80mg'$  po qAM, '40mg'$  po qPM. - Continue antihypertensives including metoprolol succinate. - Unable to start ACE/ARB/ARNI w/advanced CKD  Abdominal pain: Currently exam is reassuring, no obstipation. CT inclusive of abd/pelvis on admission showed no significant abnormalities in that area. - Checked lactic acid, lipase, CBC which are reassuring. LFTs nonspecifically mildly elevated. Parenchyma appeared normal on CT days ago. Will trend. - Start bowel regimen, get OOB, therapy evaluations. If exam changes, would check KUB. Tolerating diet currently, just no appetite.   CAD s/p CABG 5v Oct 2016, HLD: No recent angina or advancing CHF symptoms.  - Cardiology consulted for perioperative risk evaluation. I called Dr. Zada Finders to let him know of their impending assessment, without answer, though based on my assessment, there are no further tests or treatments that would modify her very high perioperative morbidity/mortality risk. Pt aware of this as well.  - NSG ok with continuing DAPT which is recommended to continue lifelong per patient's cardiologist. Will plan to continue aspirin, plavix for now.  - Continue beta blocker, statin.   Chronic hypoxemic respiratory failure, COPD without exacerbation: Remains on baseline 2-3L O2.  - Maintain SpO2 >88% with 2-3L O2.   - Continue symbicort, prn bronchodilators, chronic prednisone continued.  OSA: Intolerant to CPAP  History of fracture of left hip: s/p ORIF in 03/2021 by Dr. Sammuel Hines. CT left hip  suggests nonunion, for which patient is at very high risk with comorbidities, chronic steroid.  - Will involve orthopedics to get their opinion once more acute issues addressed. No hardware complication currently  evident.  Morbid obesity, OHS: Weight loss recommended.   Iatrogenic adrenal insufficiency: Due to chronic prednisone for COPD.  - Continue prednisone '10mg'$ . BP actually elevated, so not augmenting steroids at this time.   T2DM: HbA1c 6.3%.   HTN:  - Continue imdur/hydralazine  Right carotid stenosis: s/p TCAR.   CHB s/p PPM: Noted. With BiV ICD for severe cardiomyopathy 2008, then CRT-P 2014 with improved function.   GERD:  - Continue PPI, carafate  Gout: Quiescent.  - Continue allopurinol  Subjective: Has had uncontrolled back pain and some abdominal discomfort since surgery, got back to the room around 1am. Dilaudid has helped. Passing gas, feels like she needs to belch but main compliant is abdominal pain that is new today.   Objective: Vitals:   09/10/21 2329 09/11/21 0449 09/11/21 0700 09/11/21 1143  BP: 120/69 (!) 145/70 (!) 150/83 (!) 149/77  Pulse: 83 82 61 79  Resp: '20 19 18 19  '$ Temp: (!) 97.5 F (36.4 C) 97.6 F (36.4 C) 98.6 F (37 C) 98.2 F (36.8 C)  TempSrc: Oral Oral Oral Oral  SpO2: 93% 100% 97% 96%  Weight:      Height:      Gen: Elderly, chronically ill-appearing female in no distress Pulm: Nonlabored breathing supplemental oxygen, no wheezes or crackles. CV: Regular rate and rhythm. Soft apical systolic murmur, no rub, or gallop. No JVD, trace dependent edema. GI: Abdomen soft, diffusely tender without distention, +BS. No rebound or guarding.  Ext: Warm, no deformities Skin: Thoracic incision dressing c/d/i and no other new rashes, lesions or ulcers on visualized skin. Stable ecchymoses. Neuro: Alert and oriented. No focal neurological deficits. Psych: Judgement and insight appear fair. Mood euthymic & affect congruent. Behavior is appropriate.    Data Personally reviewed: CBC: Recent Labs  Lab 09/09/21 1438 09/09/21 1443 09/10/21 0235 09/11/21 0937  WBC 6.5  --  6.4 8.0  NEUTROABS  --   --   --  7.1  HGB 8.5* 9.2* 8.0* 8.1*  HCT 26.7*  27.0* 25.6* 27.1*  MCV 97.1  --  97.0 100.0  PLT 177  --  161 673*   Basic Metabolic Panel: Recent Labs  Lab 09/09/21 1438 09/09/21 1443 09/10/21 0235 09/11/21 0937  NA 134* 132* 138 140  K 3.9 3.9 3.2* 3.9  CL 91* 92* 95* 100  CO2 29  --  31 32  GLUCOSE 179* 176* 109* 173*  BUN 118* >130* 109* 92*  CREATININE 4.75* 4.90* 3.99* 3.18*  CALCIUM 9.3  --  9.6 9.5   GFR: Estimated Creatinine Clearance: 15.8 mL/min (A) (by C-G formula based on SCr of 3.18 mg/dL (H)). Liver Function Tests: Recent Labs  Lab 09/09/21 1438 09/11/21 0937  AST 20 55*  ALT 16 65*  ALKPHOS 93 84  BILITOT 0.6 0.6  PROT 6.5 6.4*  ALBUMIN 3.6 3.7   Recent Labs  Lab 09/11/21 0937  LIPASE 38   Urine analysis:    Component Value Date/Time   COLORURINE STRAW (A) 09/09/2021 [place] 09/09/2021 1644   LABSPEC 1.006 09/09/2021 1644   PHURINE 6.0 09/09/2021 [place] 09/09/2021 1644   HGBUR NEGATIVE 09/09/2021 [place] 09/09/2021 [place] 09/09/2021 [place] 09/09/2021 1644  UROBILINOGEN 0.2 12/06/2014 1531   NITRITE NEGATIVE 09/09/2021 [place] 09/09/2021 1644      DG Thoracic Spine 1 View  Result Date: 09/10/2021 CLINICAL DATA:  T7-T11 screw placement. EXAM: OPERATIVE THORACIC SPINE 1 VIEW(S) COMPARISON:  Thoracic spine CT 09/09/2021 FINDINGS: Cross-table lateral view of the thoracic spine demonstrates pedicle screws at T7-T11. IMPRESSION: As above. Electronically Signed   By: Ronney Asters M.D.   On: 09/10/2021 20:11   DG O-ARM IMAGE ONLY/NO REPORT  Result Date: 09/10/2021 There is no Radiologist interpretation  for this exam.  CT HIP LEFT WO CONTRAST  Result Date: 09/10/2021 CLINICAL DATA:  Hip trauma, fracture suspected, xray done previous ORIF to left hip, questionable left hip non-union vs recurrent fracture see on CT chest EXAM: CT OF THE LEFT HIP WITHOUT CONTRAST TECHNIQUE:  Multidetector CT imaging of the left hip was performed according to the standard protocol. Multiplanar CT image reconstructions were also generated. RADIATION DOSE REDUCTION: This exam was performed according to the departmental dose-optimization program which includes automated exposure control, adjustment of the mA and/or kV according to patient size and/or use of iterative reconstruction technique. COMPARISON:  CT 09/09/2021 FINDINGS: Bones/Joint/Cartilage Prior intramedullary narrowing of the left femur with distal interlocking screw for a comminuted intertrochanteric fracture. Fracture fragments are in a similar configuration without evidence of new fracture. There is callus formation without any bony bridging. There is soft tissue thickening and fluid along the fracture site. The distal interlocking screws are intact. There is sclerosis of the femoral head which is increased since March 2023, potentially reflecting developing AVN. No articular surface collapse. There is mild to moderate left hip osteoarthritis. Ligaments Suboptimally assessed by CT. Muscles and Tendons High-grade gluteal and hamstring muscle atrophy. No intramuscular collection. No acute myotendinous abnormality by CT. Soft tissues No focal fluid collection.  Colonic diverticulosis. IMPRESSION: Prior intramedullary nailing of the femur for a comminuted intertrochanteric fracture. There is callus formation without any significant bony bridging, soft tissue thickening and fluid at the fracture site suggesting nonunion. No evidence of new fracture. Electronically Signed   By: Maurine Simmering M.D.   On: 09/10/2021 08:34     Family Communication: None at bedside  Disposition: Status is: Inpatient Remains inpatient appropriate because: IV pain control, therapy evaluations Planned Discharge Destination: Pursuing CIR   Patrecia Pour, MD 09/11/2021 3:41 PM Page by [email or web address]

## 2021-09-11 NOTE — Progress Notes (Signed)
Progress Note  Patient Name: [name] Date of Encounter: 09/11/2021  Primary Cardiologist:   Sanda Klein, MD   Subjective   Back pain.  No SOB.  No chest pain   Inpatient Medications    Scheduled Meds:  allopurinol  100 mg Oral Daily   aspirin EC  81 mg Oral Daily   clopidogrel  75 mg Oral Daily   cyanocobalamin  1,000 mcg Oral Daily   cycloSPORINE  1 drop Both Eyes BID   hydrALAZINE  25 mg Oral Q8H   isosorbide mononitrate  90 mg Oral Daily   metoprolol succinate  50 mg Oral Daily   mometasone-formoterol  2 puff Inhalation BID   ondansetron       pantoprazole  40 mg Oral BID   potassium chloride  20 mEq Oral Q1500   predniSONE  10 mg Oral Q breakfast   rosuvastatin  20 mg Oral QPM   sucralfate  2 g Oral BID   Continuous Infusions:  sodium chloride Stopped (09/10/21 2020)   PRN Meds: albuterol, HYDROmorphone (DILAUDID) injection, ondansetron, ondansetron (ZOFRAN) IV, polyethylene glycol   Vital Signs    Vitals:   09/10/21 2245 09/10/21 2329 09/11/21 0449 09/11/21 0700  BP: 104/60 120/69 (!) 145/70 (!) 150/83  Pulse: 83 83 82 61  Resp: '10 20 19 18  '$ Temp: (!) 97.1 F (36.2 C) (!) 97.5 F (36.4 C) 97.6 F (36.4 C) 98.6 F (37 C)  TempSrc:  Oral Oral Oral  SpO2: 94% 93% 100% 97%  Weight:      Height:        Intake/Output Summary (Last 24 hours) at 09/11/2021 1010 Last data filed at 09/11/2021 0600 Gross per 24 hour  Intake 2550 ml  Output 800 ml  Net 1750 ml   Filed Weights   09/09/21 1431  Weight: 89.8 kg    Telemetry    NSR with ventricular pacing, AV demand pacing - Personally Reviewed  ECG    NA - Personally Reviewed  Physical Exam   GEN: No acute distress.   Neck: No  JVD Cardiac: RRR, soft apical systolic murmurs, rubs, or gallops.  Respiratory: Clear  to auscultation bilaterally. GI: Soft, nontender, non-distended  MS: No  edema; No deformity. Neuro:  Nonfocal  Psych: Normal affect   Labs    Chemistry Recent Labs   Lab 09/09/21 1438 09/09/21 1443 09/10/21 0235  NA 134* 132* 138  K 3.9 3.9 3.2*  CL 91* 92* 95*  CO2 29  --  31  GLUCOSE 179* 176* 109*  BUN 118* >130* 109*  CREATININE 4.75* 4.90* 3.99*  CALCIUM 9.3  --  9.6  PROT 6.5  --   --   ALBUMIN 3.6  --   --   AST 20  --   --   ALT 16  --   --   ALKPHOS 93  --   --   BILITOT 0.6  --   --   GFRNONAA 9*  --  11*  ANIONGAP 14  --  12     Hematology Recent Labs  Lab 09/09/21 1438 09/09/21 1443 09/10/21 0235 09/11/21 0937  WBC 6.5  --  6.4 8.0  RBC 2.75*  --  2.64* 2.71*  HGB 8.5* 9.2* 8.0* 8.1*  HCT 26.7* 27.0* 25.6* 27.1*  MCV 97.1  --  97.0 100.0  MCH 30.9  --  30.3 29.9  MCHC 31.8  --  31.3 29.9*  RDW 12.9  --  12.9  13.0  PLT 177  --  161 149*    Cardiac EnzymesNo results for input(s): "TROPONINI" in the last 168 hours. No results for input(s): "TROPIPOC" in the last 168 hours.   BNP Recent Labs  Lab 09/09/21 1438  BNP 569.7*     DDimer No results for input(s): "DDIMER" in the last 168 hours.   Radiology    DG Thoracic Spine 1 View  Result Date: 09/10/2021 CLINICAL DATA:  T7-T11 screw placement. EXAM: OPERATIVE THORACIC SPINE 1 VIEW(S) COMPARISON:  Thoracic spine CT 09/09/2021 FINDINGS: Cross-table lateral view of the thoracic spine demonstrates pedicle screws at T7-T11. IMPRESSION: As above. Electronically Signed   By: Ronney Asters M.D.   On: 09/10/2021 20:11   DG O-ARM IMAGE ONLY/NO REPORT  Result Date: 09/10/2021 There is no Radiologist interpretation  for this exam.  CT HIP LEFT WO CONTRAST  Result Date: 09/10/2021 CLINICAL DATA:  Hip trauma, fracture suspected, xray done previous ORIF to left hip, questionable left hip non-union vs recurrent fracture see on CT chest EXAM: CT OF THE LEFT HIP WITHOUT CONTRAST TECHNIQUE: Multidetector CT imaging of the left hip was performed according to the standard protocol. Multiplanar CT image reconstructions were also generated. RADIATION DOSE REDUCTION: This exam was  performed according to the departmental dose-optimization program which includes automated exposure control, adjustment of the mA and/or kV according to patient size and/or use of iterative reconstruction technique. COMPARISON:  CT 09/09/2021 FINDINGS: Bones/Joint/Cartilage Prior intramedullary narrowing of the left femur with distal interlocking screw for a comminuted intertrochanteric fracture. Fracture fragments are in a similar configuration without evidence of new fracture. There is callus formation without any bony bridging. There is soft tissue thickening and fluid along the fracture site. The distal interlocking screws are intact. There is sclerosis of the femoral head which is increased since March 2023, potentially reflecting developing AVN. No articular surface collapse. There is mild to moderate left hip osteoarthritis. Ligaments Suboptimally assessed by CT. Muscles and Tendons High-grade gluteal and hamstring muscle atrophy. No intramuscular collection. No acute myotendinous abnormality by CT. Soft tissues No focal fluid collection.  Colonic diverticulosis. IMPRESSION: Prior intramedullary nailing of the femur for a comminuted intertrochanteric fracture. There is callus formation without any significant bony bridging, soft tissue thickening and fluid at the fracture site suggesting nonunion. No evidence of new fracture. Electronically Signed   By: Maurine Simmering M.D.   On: 09/10/2021 08:34   CT CHEST ABDOMEN PELVIS WO CONTRAST  Result Date: 09/09/2021 CLINICAL DATA:  Blunt trauma EXAM: CT CHEST, ABDOMEN AND PELVIS WITHOUT CONTRAST TECHNIQUE: Multidetector CT imaging of the chest, abdomen and pelvis was performed following the standard protocol without IV contrast. RADIATION DOSE REDUCTION: This exam was performed according to the departmental dose-optimization program which includes automated exposure control, adjustment of the mA and/or kV according to patient size and/or use of iterative  reconstruction technique. COMPARISON:  Chest radiography same day.  CT 11/20/2017. FINDINGS: CT CHEST FINDINGS Cardiovascular: Pacemaker. Cardiomegaly. Coronary artery calcification and aortic atherosclerotic calcification. Mediastinum/Nodes: No evidence of mediastinal bleeding. No adenopathy or mass. Lungs/Pleura: No pneumothorax. Areas of linear atelectasis in both lungs. 6 mm pulmonary nodule on the right image 73. 5 mm pulmonary nodule on the right image 87. No nodule seen on the left. These are similar to the study of October 2019 and therefore benign. Musculoskeletal: No rib fracture. Acute fracture of the thoracic spine at T9, possibly due to flexion or extension mechanisms. No displaced bone compromising the spinal canal. CT ABDOMEN  PELVIS FINDINGS Hepatobiliary: Liver parenchyma is normal. Pancreas: Negative Spleen: Negative Adrenals/Urinary Tract: Adrenal glands are normal. Kidneys are normal. Bladder is normal. Stomach/Bowel: No evidence of bowel injury.  No acute bowel finding. Vascular/Lymphatic: Aortic atherosclerosis. No aneurysm. IVC is normal. No adenopathy. Reproductive: No pelvic mass. Other: No free fluid or air. Musculoskeletal: Previous ORIF of left hip fracture. There is either nonunion or recurrent fracture at this site. IMPRESSION: Acute fracture of the thoracic spine at the T9 level which could be due to hyperflexion or hyper extension mechanisms. No evidence of malalignment or bone encroachment upon the canal. Nonunion or recurrent fracture at the site of previous ORIF of the left femur. No abdominal organ injury. Two nodules in the lower right lung, stable since 2019 and therefore benign. Electronically Signed   By: Nelson Chimes M.D.   On: 09/09/2021 16:27   CT T-SPINE NO CHARGE  Result Date: 09/09/2021 CLINICAL DATA:  Trauma EXAM: CT THORACIC AND LUMBAR SPINE WITHOUT CONTRAST TECHNIQUE: Multidetector CT imaging of the thoracic and lumbar spine was performed without contrast.  Multiplanar CT image reconstructions were also generated. RADIATION DOSE REDUCTION: This exam was performed according to the departmental dose-optimization program which includes automated exposure control, adjustment of the mA and/or kV according to patient size and/or use of iterative reconstruction technique. COMPARISON:  None Available. FINDINGS: CT THORACIC SPINE FINDINGS Alignment: Normal. Vertebrae: The vertebral bodies are diffusely osteopenic. There are flowing anterior osteophytes throughout the thoracic spine consistent with background diffuse idiopathic skeletal hyperostosis. There is an acute distracted fracture of the T9 vertebral body with a fracture plane extending through the anterior endplate involving the left aspect of the bridging osteophytes between the T9 and T10 vertebral bodies extending posteriorly to the posterior aspect of the superior endplate. There is no evidence of extension to the posterior elements. There is no bony retropulsion. No other definite fracture is seen within the confines of diffuse osteopenia. There is no suspicious osseous lesion. Paraspinal and other soft tissues: The paraspinal soft tissues are unremarkable. There is no significant prevertebral hematoma. The intrathoracic contents are assessed on the separately dictated CT chest. Disc levels: There is multilevel disc space narrowing with degenerative endplate change and bulky flowing anterior osteophytes throughout the thoracic spine. There is overall mild multilevel facet arthropathy resulting in up to mild-to-moderate bilateral neural foraminal stenosis at T8-T9 and T9-T10. There is no evidence of high-grade osseous spinal canal stenosis. CT LUMBAR SPINE FINDINGS Segmentation: Standard; the lowest formed disc space is designated L5-S1. Alignment: There is grade 1 anterolisthesis of L5 on S1. Alignment is otherwise normal. Vertebrae: Vertebral body heights are preserved. There are flowing anterior osteophytes  extending from the lower thoracic spine through the L2 vertebral body consistent with diffuse idiopathic skeletal hyperostosis. There is no definite acute fracture within the confines of diffusely osteopenic bones. There is no suspicious osseous lesion. Paraspinal and other soft tissues: The paraspinal soft tissues are unremarkable. The abdominal and pelvic viscera are assessed on the separately dictated CT abdomen/pelvis. Disc levels: There is marked multilevel disc desiccation and narrowing with multilevel vacuum disc phenomenon. There is advanced multilevel facet arthropathy most severe at L4-L5 and L5-S1. There are disc bulges throughout the lumbar spine resulting in at least mild-to-moderate spinal canal stenosis at L1-L2 and L2-L3. The disc bulges and facet arthropathy results in mild-to-moderate left neural foraminal stenosis at L3-L4, moderate bilateral neural foraminal stenosis at L4-L5, and severe bilateral neural foraminal stenosis at L5-S1. IMPRESSION: 1. Acute distracted fracture of the  T9 vertebral body extending from the anterior endplate involving the bridging osteophyte between the T9 and T10 vertebral bodies posteriorly to the posterior aspect of the superior endplate, superimposed on pre-existing diffuse idiopathic skeletal hyperostosis. No extension to the posterior elements or bony retropulsion. Neurosurgical consultation is recommended. 2. No other definite evidence of acute fracture in the thoracolumbar spine, though evaluation is limited due to significant osteopenia. 3. Multilevel degenerative changes as above. Electronically Signed   By: Valetta Mole M.D.   On: 09/09/2021 16:12   CT L-SPINE NO CHARGE  Result Date: 09/09/2021 CLINICAL DATA:  Trauma EXAM: CT THORACIC AND LUMBAR SPINE WITHOUT CONTRAST TECHNIQUE: Multidetector CT imaging of the thoracic and lumbar spine was performed without contrast. Multiplanar CT image reconstructions were also generated. RADIATION DOSE REDUCTION: This  exam was performed according to the departmental dose-optimization program which includes automated exposure control, adjustment of the mA and/or kV according to patient size and/or use of iterative reconstruction technique. COMPARISON:  None Available. FINDINGS: CT THORACIC SPINE FINDINGS Alignment: Normal. Vertebrae: The vertebral bodies are diffusely osteopenic. There are flowing anterior osteophytes throughout the thoracic spine consistent with background diffuse idiopathic skeletal hyperostosis. There is an acute distracted fracture of the T9 vertebral body with a fracture plane extending through the anterior endplate involving the left aspect of the bridging osteophytes between the T9 and T10 vertebral bodies extending posteriorly to the posterior aspect of the superior endplate. There is no evidence of extension to the posterior elements. There is no bony retropulsion. No other definite fracture is seen within the confines of diffuse osteopenia. There is no suspicious osseous lesion. Paraspinal and other soft tissues: The paraspinal soft tissues are unremarkable. There is no significant prevertebral hematoma. The intrathoracic contents are assessed on the separately dictated CT chest. Disc levels: There is multilevel disc space narrowing with degenerative endplate change and bulky flowing anterior osteophytes throughout the thoracic spine. There is overall mild multilevel facet arthropathy resulting in up to mild-to-moderate bilateral neural foraminal stenosis at T8-T9 and T9-T10. There is no evidence of high-grade osseous spinal canal stenosis. CT LUMBAR SPINE FINDINGS Segmentation: Standard; the lowest formed disc space is designated L5-S1. Alignment: There is grade 1 anterolisthesis of L5 on S1. Alignment is otherwise normal. Vertebrae: Vertebral body heights are preserved. There are flowing anterior osteophytes extending from the lower thoracic spine through the L2 vertebral body consistent with diffuse  idiopathic skeletal hyperostosis. There is no definite acute fracture within the confines of diffusely osteopenic bones. There is no suspicious osseous lesion. Paraspinal and other soft tissues: The paraspinal soft tissues are unremarkable. The abdominal and pelvic viscera are assessed on the separately dictated CT abdomen/pelvis. Disc levels: There is marked multilevel disc desiccation and narrowing with multilevel vacuum disc phenomenon. There is advanced multilevel facet arthropathy most severe at L4-L5 and L5-S1. There are disc bulges throughout the lumbar spine resulting in at least mild-to-moderate spinal canal stenosis at L1-L2 and L2-L3. The disc bulges and facet arthropathy results in mild-to-moderate left neural foraminal stenosis at L3-L4, moderate bilateral neural foraminal stenosis at L4-L5, and severe bilateral neural foraminal stenosis at L5-S1. IMPRESSION: 1. Acute distracted fracture of the T9 vertebral body extending from the anterior endplate involving the bridging osteophyte between the T9 and T10 vertebral bodies posteriorly to the posterior aspect of the superior endplate, superimposed on pre-existing diffuse idiopathic skeletal hyperostosis. No extension to the posterior elements or bony retropulsion. Neurosurgical consultation is recommended. 2. No other definite evidence of acute fracture in the thoracolumbar  spine, though evaluation is limited due to significant osteopenia. 3. Multilevel degenerative changes as above. Electronically Signed   By: Valetta Mole M.D.   On: 09/09/2021 16:12   CT Head Wo Contrast  Result Date: 09/09/2021 CLINICAL DATA:  Head trauma, minor (Age >= 65y); Neck trauma (Age >= 65y) EXAM: CT HEAD WITHOUT CONTRAST CT CERVICAL SPINE WITHOUT CONTRAST TECHNIQUE: Multidetector CT imaging of the head and cervical spine was performed following the standard protocol without intravenous contrast. Multiplanar CT image reconstructions of the cervical spine were also  generated. RADIATION DOSE REDUCTION: This exam was performed according to the departmental dose-optimization program which includes automated exposure control, adjustment of the mA and/or kV according to patient size and/or use of iterative reconstruction technique. COMPARISON:  None Available. FINDINGS: CT HEAD FINDINGS Brain: No evidence of acute infarction, hemorrhage, hydrocephalus, extra-axial collection or mass lesion/mass effect. Vascular: Calcific intracranial atherosclerosis. Skull: No acute fracture. Sinuses/Orbits: Mild paranasal sinus mucosal thickening. No acute orbital findings. Other: No mastoid effusions. CT CERVICAL SPINE FINDINGS Alignment: Straightening.  No substantial sagittal subluxation. Skull base and vertebrae: Vertebral body heights are maintained. No evidence of acute fracture. C4-C5 ACDF with evidence of some bony fusion across the disc space. Soft tissues and spinal canal: No prevertebral fluid or swelling. No visible canal hematoma. Disc levels: Moderate to severe multilevel degenerative disc disease and facet/uncovertebral hypertrophy with varying degrees of neural foraminal stenosis. Severe craniocervical degenerative change including bony remodeling. Upper chest: Evaluated on concurrent chest abdomen/pelvis. IMPRESSION: 1. No evidence of acute intracranial abnormality. 2. No evidence of acute fracture or traumatic malalignment in the cervical spine. Electronically Signed   By: Margaretha Sheffield M.D.   On: 09/09/2021 15:37   CT Cervical Spine Wo Contrast  Result Date: 09/09/2021 CLINICAL DATA:  Head trauma, minor (Age >= 65y); Neck trauma (Age >= 65y) EXAM: CT HEAD WITHOUT CONTRAST CT CERVICAL SPINE WITHOUT CONTRAST TECHNIQUE: Multidetector CT imaging of the head and cervical spine was performed following the standard protocol without intravenous contrast. Multiplanar CT image reconstructions of the cervical spine were also generated. RADIATION DOSE REDUCTION: This exam was  performed according to the departmental dose-optimization program which includes automated exposure control, adjustment of the mA and/or kV according to patient size and/or use of iterative reconstruction technique. COMPARISON:  None Available. FINDINGS: CT HEAD FINDINGS Brain: No evidence of acute infarction, hemorrhage, hydrocephalus, extra-axial collection or mass lesion/mass effect. Vascular: Calcific intracranial atherosclerosis. Skull: No acute fracture. Sinuses/Orbits: Mild paranasal sinus mucosal thickening. No acute orbital findings. Other: No mastoid effusions. CT CERVICAL SPINE FINDINGS Alignment: Straightening.  No substantial sagittal subluxation. Skull base and vertebrae: Vertebral body heights are maintained. No evidence of acute fracture. C4-C5 ACDF with evidence of some bony fusion across the disc space. Soft tissues and spinal canal: No prevertebral fluid or swelling. No visible canal hematoma. Disc levels: Moderate to severe multilevel degenerative disc disease and facet/uncovertebral hypertrophy with varying degrees of neural foraminal stenosis. Severe craniocervical degenerative change including bony remodeling. Upper chest: Evaluated on concurrent chest abdomen/pelvis. IMPRESSION: 1. No evidence of acute intracranial abnormality. 2. No evidence of acute fracture or traumatic malalignment in the cervical spine. Electronically Signed   By: Margaretha Sheffield M.D.   On: 09/09/2021 15:37   DG Pelvis Portable  Result Date: 09/09/2021 CLINICAL DATA:  A 76 year old female presents following fall, patient is on blood thinners. EXAM: PORTABLE PELVIS 1-2 VIEWS COMPARISON:  August 27, 2021. FINDINGS: This study is markedly limited by patient body habitus. Scattered loops of  gas and stool filled bowel project over the lower abdomen and over the pelvis. These bowel loops limited pelvic assessment. No displaced fracture or destructive bone lesion of the pelvis accounting for limitations on the current  study. Degenerative changes are present in the bilateral hips. Evidence of prior ORIF with femoral nailing on the LEFT. The distal aspect of the LEFT femoral nail is not assessed as it passes beyond the mid shaft of the femur. Callus formation at the site of prior fracture in the intratrochanteric LEFT femur is similar to previous imaging. Soft tissues are otherwise unremarkable. Degenerative changes are noted in the spine. IMPRESSION: 1. Markedly limited study due to patient body habitus. 2. No displaced fracture or destructive bone lesion of the pelvis. 3. Evidence of prior ORIF on the LEFT with femoral nailing, femoral nail extends beyond the mid femur. 4. Callus formation at the site of prior fracture in the intratrochanteric LEFT femur is similar to previous imaging. Electronically Signed   By: Zetta Bills M.D.   On: 09/09/2021 14:52   DG Chest Port 1 View  Result Date: 09/09/2021 CLINICAL DATA:  76 year old female presenting with fall on blood thinners. EXAM: PORTABLE CHEST 1 VIEW COMPARISON:  July 15, 2021. FINDINGS: EKG leads project over the chest.  Post median sternotomy for CABG. RIGHT sided multi lead pacer defibrillator remains in place. Cardiomediastinal contours and hilar structures are stable with signs of median sternotomy for CABG. No lobar consolidation.  No sign of pneumothorax. On limited assessment there is no acute skeletal finding. IMPRESSION: No acute radiographic findings in the chest. Electronically Signed   By: Zetta Bills M.D.   On: 09/09/2021 14:49    Cardiac Studies   Echo  08/05/21  1. Technically difficult study. Left ventricular ejection fraction, by  estimation, is 45 to 50%. The left ventricle has mildly decreased  function. The left ventricle demonstrates regional wall motion  abnormalities (see scoring diagram/findings for  description). Apical hypokinesis. There is mild left ventricular  hypertrophy. Left ventricular diastolic parameters are indeterminate.    2. Right ventricular systolic function is normal. The right ventricular  size is normal. There is mildly elevated pulmonary artery systolic  pressure. The estimated right ventricular systolic pressure is 24.2 mmHg.   3. The mitral valve is degenerative. No evidence of mitral valve  regurgitation. Moderate mitral annular calcification.   4. The aortic valve was not well visualized. There is moderate  calcification of the aortic valve. Aortic valve regurgitation is trivial.  Mild aortic valve stenosis. Vmax 2.5 m/s, MG 13 mmHg, AVA 2.0 cm^2, DI  0.48   5. The inferior vena cava is dilated in size with <50% respiratory  variability, suggesting right atrial pressure of 15 mmHg.   Patient Profile     76 y.o. female with a hx of CAD status post CABG ' 16 (LIMA to LAD, SVG to D1, SVG to OM1 and OM2, SVG to PDA), HFpEF, adrenal insufficiency, type 2 diabetes, hypertension, CKD stage III, OSA, asthma, CHB s/p PPM, carotid artery disease s/p TCAR and morbid obesity who is being seen 09/10/2021 for the evaluation of preoperative evaluation at the request of Dr. Bonner Puna.  Assessment & Plan    Post op:  Post percutaneous instrumentation for thoracic fracture.  No obvious acute post cardiac complications.   CAD/CABG:  Per DR. Croitoru's notes she is to be on lifelong DAPT.   It would be OK to hold this for the short term post procedure if neurosurgery thought this  was prudent.  I will have the nurse check with the neurosurgery team.  Continue DAPT if safe from a post procedural standpoint.       CHB/PPM:  Appears to have normal pacer function on tele.  Device interrogated with reported normal function.  I will verify when I am able to review this result  CKD IV:    Followed by nephrology.  Unchanged creat post op.  HFmrEF:  Seems to be euvolemic.  No change in therapy.    For questions or updates, please contact [place] Please consult [email or web address] for contact info under Cardiology/STEMI.    Signed, Minus Breeding, MD  09/11/2021, 10:10 AM

## 2021-09-11 NOTE — Evaluation (Signed)
Physical Therapy Evaluation Patient Details Name: [name] MRN: [id number] DOB: [birth year] Today's Date: 09/11/2021  History of Present Illness  pt is a 76 y/o female admitted 7/31 s/p fall sustaining unstable T9-10 fracture s/p T&-T11 posterior pedicle screw fixation.  PMHx:  CAC, AICD, afib, CHF, HTN, IBA, OSA, stroke, DM2,  cabg, multiple Cardiac Caths, L IM nailing L hip, angiograms,  Clinical Impression  Pt admitted with/for fall sustaining unstable T9-10 fx s/p ORIF with pedicle screws.  Pt presently needing moderate assist for basic mobility..  Pt currently limited functionally due to the problems listed. ( See problems list.)   Pt will benefit from PT to maximize function and safety in order to get ready for next venue listed below.        Recommendations for follow up therapy are one component of a multi-disciplinary discharge planning process, led by the attending physician.  Recommendations may be updated based on patient status, additional functional criteria and insurance authorization.  Follow Up Recommendations Acute inpatient rehab (3hours/day)      Assistance Recommended at Discharge Intermittent Supervision/Assistance  Patient can return home with the following  A little help with walking and/or transfers;A little help with bathing/dressing/bathroom;Assistance with cooking/housework;Assist for transportation;Help with stairs or ramp for entrance    Equipment Recommendations None recommended by PT  Recommendations for Other Services  Rehab consult;OT consult    Functional Status Assessment Patient has had a recent decline in their functional status and demonstrates the ability to make significant improvements in function in a reasonable and predictable amount of time.     Precautions / Restrictions Precautions Precautions: Fall      Mobility  Bed Mobility Overal bed mobility: Needs Assistance Bed Mobility: Rolling, Sidelying to Sit Rolling: Mod  assist Sidelying to sit: Mod assist       General bed mobility comments: cues for back prec, advisement to come up via L or R side.  assisted rolling and transition via R elbow.  Mod scooting asssit to EOB    Transfers Overall transfer level: Needs assistance Equipment used: Rolling walker (2 wheels) Transfers: Sit to/from Stand, Bed to chair/wheelchair/BSC Sit to Stand: Mod assist, From elevated surface Stand pivot transfers: Mod assist         General transfer comment: sit to stand into a RW and 2nd trial face to face assist for standing and pivot to the chair.    Ambulation/Gait               General Gait Details: NT  Stairs            Wheelchair Mobility    Modified Rankin (Stroke Patients Only)       Balance Overall balance assessment: Needs assistance Sitting-balance support: No upper extremity supported, Single extremity supported, Feet supported Sitting balance-Leahy Scale: Fair       Standing balance-Leahy Scale: Poor Standing balance comment: reliant on external support                             Pertinent Vitals/Pain Pain Assessment Pain Assessment: Faces Faces Pain Scale: Hurts even more Pain Location: sx thoracic incision Pain Descriptors / Indicators: Discomfort, Grimacing, Guarding Pain Intervention(s): Limited activity within patient's tolerance, Monitored during session, Premedicated before session    Home Living Family/patient expects to be discharged to:: Private residence Living Arrangements: Alone;Other (Comment) (after several months at son's home) Available Help at Discharge: Family;Available PRN/intermittently;Personal care attendant (Son states they  can get his miece and nephew who don't work to stay with their grandma.) Type of Home: Mobile home Home Access: Stairs to enter Entrance Stairs-Rails: Left Entrance Stairs-Number of Steps: 4   Home Layout: One level Home Equipment: Tub bench;Rolling Walker (2  wheels);Hospital bed;Wheelchair - manual;Adaptive equipment;Rollator (4 wheels) Additional Comments: Wears 2L O2 baseline.Recently increased to 3L in hospital.  Son works during day, not available to assist. her grandson is available.    Prior Function Prior Level of Function : Needs assist;History of Falls (last six months)             Mobility Comments: Has been using her RW in home. Reports enjoying watching TV and cooking/housework. has wc for [place] distances. ADLs Comments: Reports assist with LE dressing, hygiene after toileting and sponge bathing as pt reports there is no shower. Pt does not drive and uses mass transit or son assists.     Hand Dominance   Dominant Hand: Right    Extremity/Trunk Assessment   Upper Extremity Assessment Upper Extremity Assessment: Generalized weakness    Lower Extremity Assessment Lower Extremity Assessment: Generalized weakness    Cervical / Trunk Assessment Cervical / Trunk Assessment: Kyphotic;Back Surgery  Communication   Communication: No difficulties  Cognition Arousal/Alertness: Awake/alert Behavior During Therapy: WFL for tasks assessed/performed Overall Cognitive Status: Within Functional Limits for tasks assessed (NT formally)                                          General Comments General comments (skin integrity, edema, etc.): Initiated general and advised back care and mobility.    Exercises     Assessment/Plan    PT Assessment Patient needs continued PT services  PT Problem List Decreased strength;Decreased activity tolerance;Decreased balance;Decreased mobility;Decreased knowledge of use of DME;Pain       PT Treatment Interventions Gait training;Functional mobility training;Stair training;DME instruction;Therapeutic activities;Patient/family education    PT Goals (Current goals can be found in the Care Plan section)  Acute Rehab PT Goals Patient Stated Goal: I want to get home.  in  house rehab admission PT Goal Formulation: With patient Time For Goal Achievement: 09/25/21 Potential to Achieve Goals: Good    Frequency Min 4X/week     Co-evaluation               AM-PAC PT "6 Clicks" Mobility  Outcome Measure Help needed turning from your back to your side while in a flat bed without using bedrails?: A Lot Help needed moving from lying on your back to sitting on the side of a flat bed without using bedrails?: A Lot Help needed moving to and from a bed to a chair (including a wheelchair)?: A Lot Help needed standing up from a chair using your arms (e.g., wheelchair or bedside chair)?: A Lot Help needed to walk in hospital room?: A Lot Help needed climbing 3-5 steps with a railing? : A Lot 6 Click Score: 12    End of Session   Activity Tolerance: Patient tolerated treatment well;Patient limited by pain Patient left: in chair;with call bell/phone within reach;with chair alarm set;with family/visitor present Nurse Communication: Mobility status PT Visit Diagnosis: Other abnormalities of gait and mobility (R26.89);Pain Pain - part of body:  (back)    Time: 1245-1320 PT Time Calculation (min) (ACUTE ONLY): 35 min   Charges:   PT Evaluation $PT Eval Moderate Complexity: 1  Mod PT Treatments $Therapeutic Activity: 8-22 mins        09/11/2021  Ginger Carne., PT Acute Rehabilitation Services [phone]  (pager) [phone]  (office)  Tessie Fass Dashia Caldeira 09/11/2021, 1:58 PM

## 2021-09-11 NOTE — Progress Notes (Signed)
Neurosurgery Service Progress Note  Subjective: No acute events overnight, incisional pain as expected, no new weakness/numbness   Objective: Vitals:   09/10/21 2230 09/10/21 2245 09/10/21 2329 09/11/21 0449  BP: 108/63 104/60 120/69 (!) 145/70  Pulse: 80 83 83 82  Resp: '17 10 20 19  '$ Temp: (!) 97.1 F (36.2 C) (!) 97.1 F (36.2 C) (!) 97.5 F (36.4 C) 97.6 F (36.4 C)  TempSrc:   Oral Oral  SpO2: 91% 94% 93% 100%  Weight:      Height:        Physical Exam: Strength 5/5 x4 and SILTx4, incisions c/d/i  Assessment & Plan: 76 y.o. woman w/ DISH s/p fall with thoracic fracture s/p perc instrumentation, recovering well.  -activity as tolerated, no spine precautions needed -will place xrays for baseline films, can obtain when pt feels up to it, no rush  Judith Part  09/11/21 8:20 AM

## 2021-09-12 ENCOUNTER — Encounter (HOSPITAL_COMMUNITY): Payer: Self-pay | Admitting: Neurological Surgery

## 2021-09-12 DIAGNOSIS — S22079A Unspecified fracture of T9-T10 vertebra, initial encounter for closed fracture: Secondary | ICD-10-CM | POA: Diagnosis not present

## 2021-09-12 DIAGNOSIS — I5032 Chronic diastolic (congestive) heart failure: Secondary | ICD-10-CM | POA: Diagnosis not present

## 2021-09-12 DIAGNOSIS — N179 Acute kidney failure, unspecified: Secondary | ICD-10-CM | POA: Diagnosis not present

## 2021-09-12 DIAGNOSIS — Z950 Presence of cardiac pacemaker: Secondary | ICD-10-CM | POA: Diagnosis not present

## 2021-09-12 LAB — COMPREHENSIVE METABOLIC PANEL
ALT: 42 U/L (ref 0–44)
AST: 26 U/L (ref 15–41)
Albumin: 3.3 g/dL — ABNORMAL LOW (ref 3.5–5.0)
Alkaline Phosphatase: 74 U/L (ref 38–126)
Anion gap: 13 (ref 5–15)
BUN: 94 mg/dL — ABNORMAL HIGH (ref 8–23)
CO2: 30 mmol/L (ref 22–32)
Calcium: 9.5 mg/dL (ref 8.9–10.3)
Chloride: 97 mmol/L — ABNORMAL LOW (ref 98–111)
Creatinine, Ser: 3.03 mg/dL — ABNORMAL HIGH (ref 0.44–1.00)
GFR, Estimated: 15 mL/min — ABNORMAL LOW (ref >60)
Glucose, Bld: 125 mg/dL — ABNORMAL HIGH (ref 70–99)
Potassium: 3.8 mmol/L (ref 3.5–5.1)
Sodium: 140 mmol/L (ref 135–145)
Total Bilirubin: 0.7 mg/dL (ref 0.3–1.2)
Total Protein: 5.5 g/dL — ABNORMAL LOW (ref 6.5–8.1)

## 2021-09-12 IMAGING — CT CT HEAD CODE STROKE
3 series · 14 of 47 positions shown, 16 images · non-contrast
Comparison: 04/01/2020

CLINICAL DATA: Code stroke. Neuro deficit, acute, stroke suspected.
Facial droop, slurred speech, and generalized weakness.

EXAM:
CT HEAD WITHOUT CONTRAST
TECHNIQUE: Contiguous axial images were obtained from the base of the skull
through the vertex without intravenous contrast.

[Series 2: head 5.0 st · axial · 0.41mm/px · z∈[-60,+80]mm · 8 of 34 slices shown, 10 images]
[im 3/34  brain]
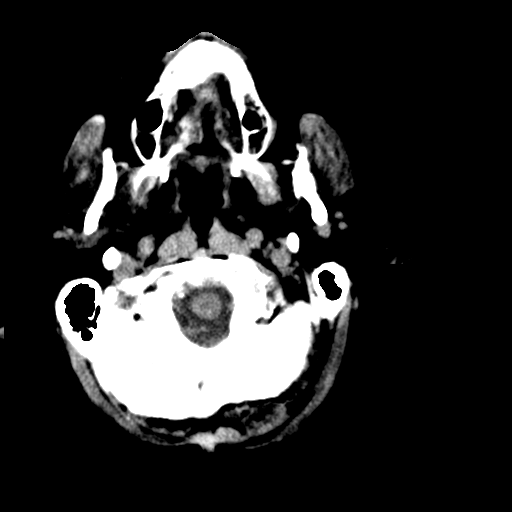
[im 3/34  bone]
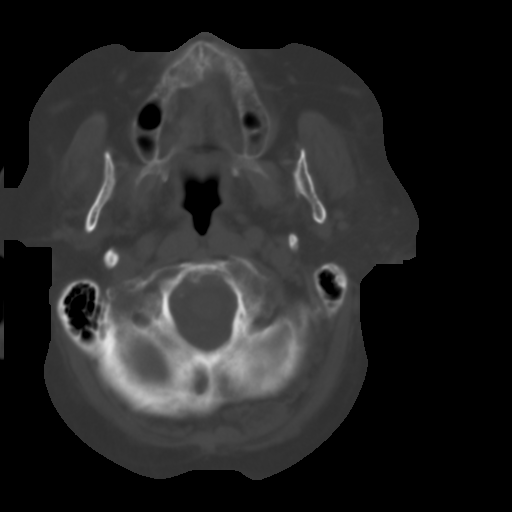
[im 7/34  brain]
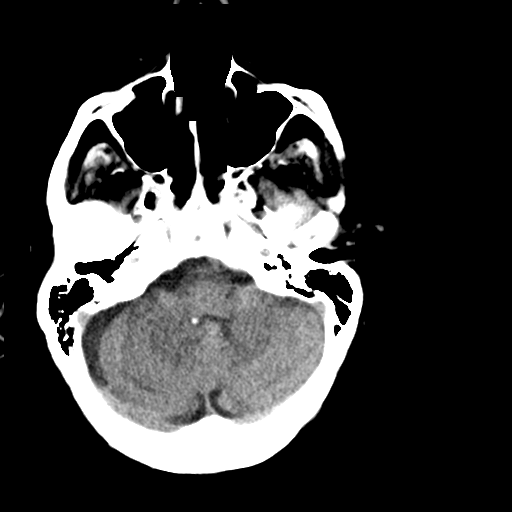
[im 11/34  brain]
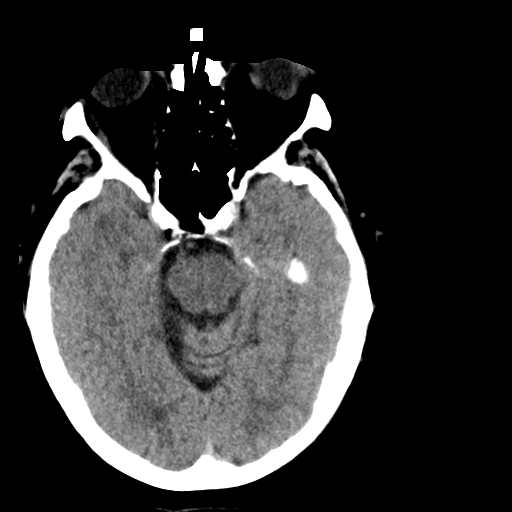
[im 15/34  brain]
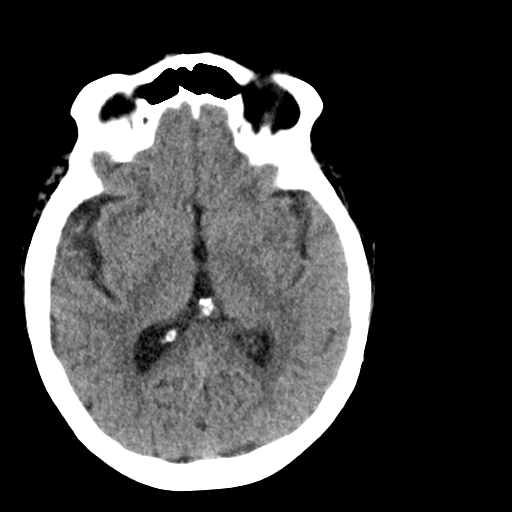
[im 19/34  brain]
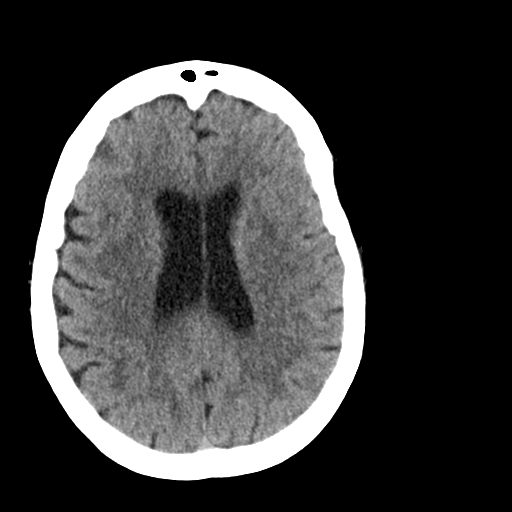
[im 19/34  bone]
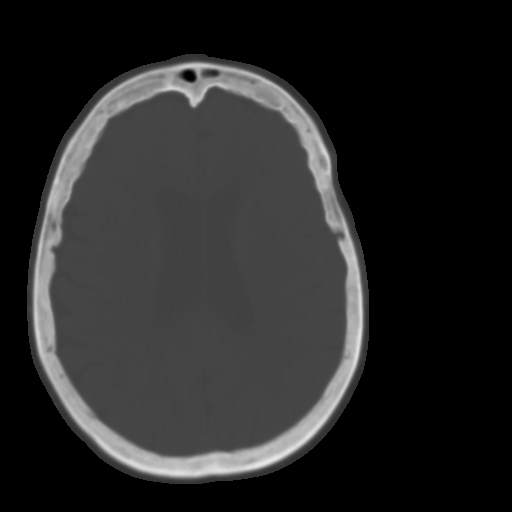
[im 23/34  brain]
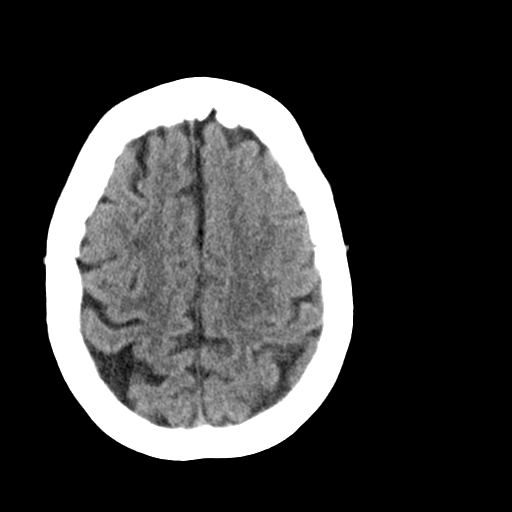
[im 27/34  brain]
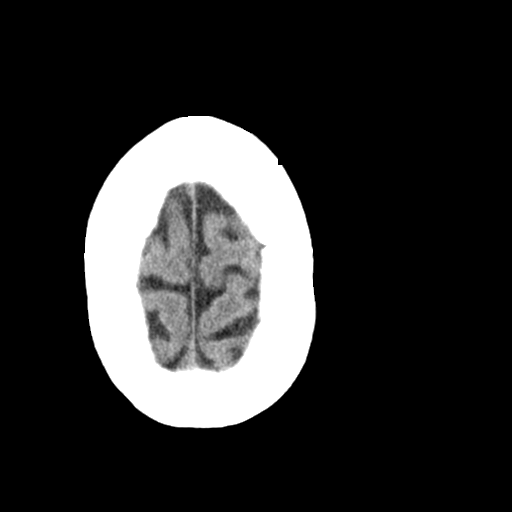
[im 31/34  brain]
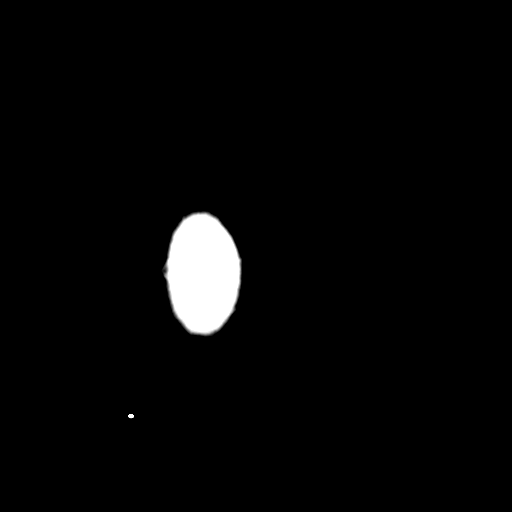

[Series 5: head 3.0 cor st · coronal · 0.29mm/px · 3 of 71 slices shown]
[im 25/71  brain]
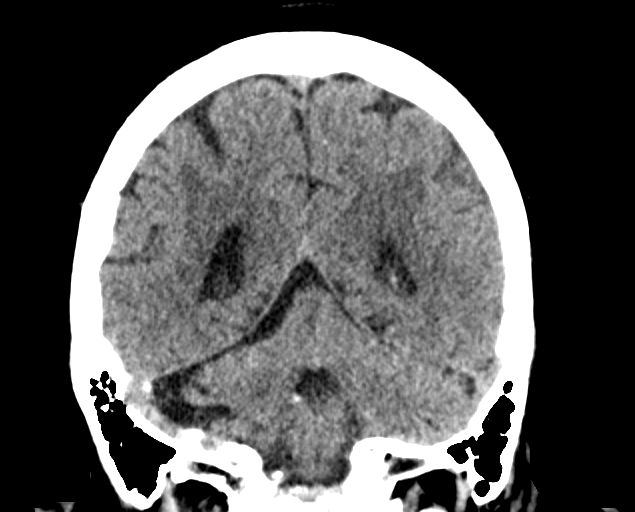
[im 32/71  brain]
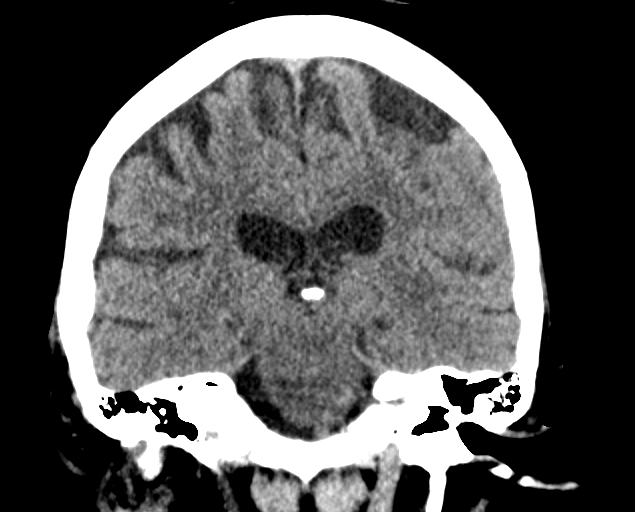
[im 39/71  brain]
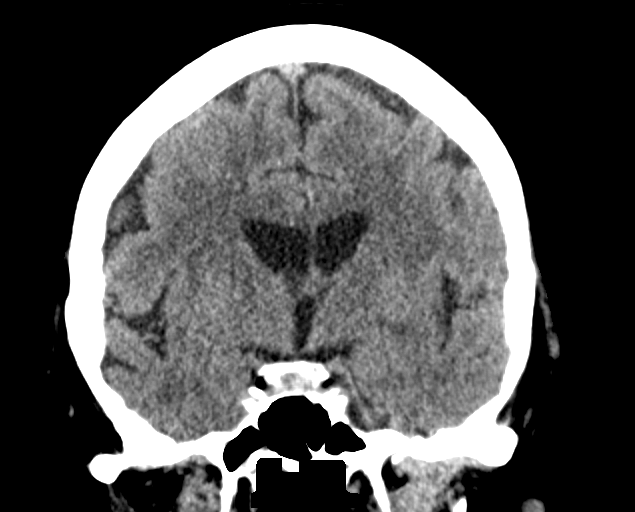

[Series 6: head 3.0 sag st · sagittal · 0.31mm/px · 3 of 67 slices shown]
[im 23/67  brain]
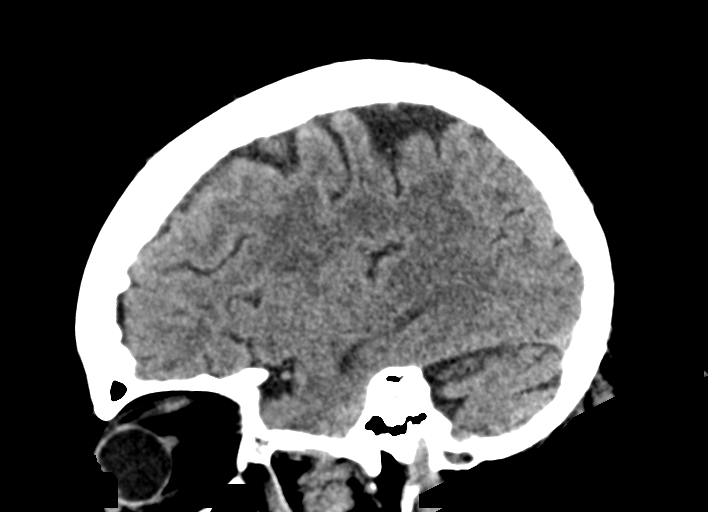
[im 34/67  brain]
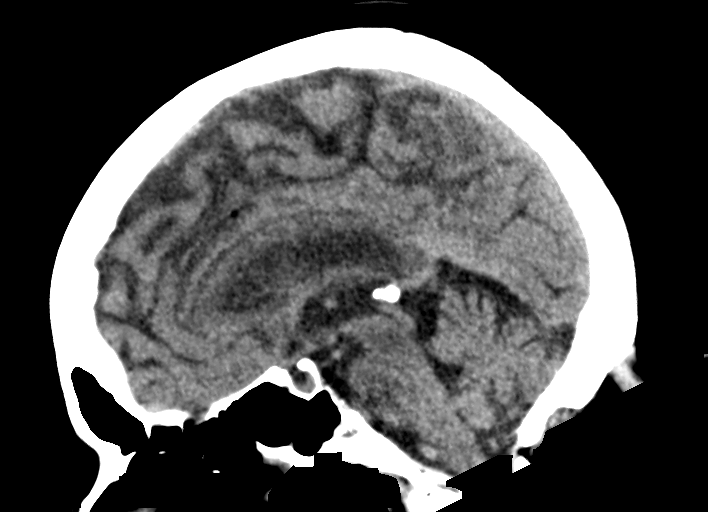
[im 45/67  brain]
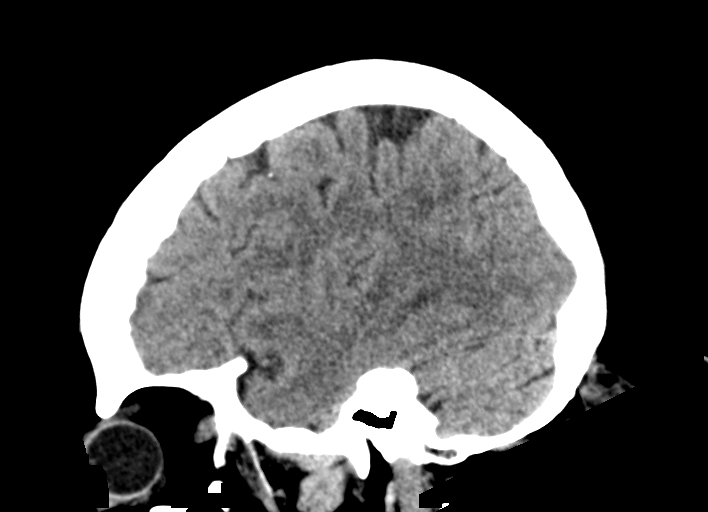

[14 of 47 positions shown; findings below may reference images not displayed]

FINDINGS: Brain: There is no evidence of an acute infarct, intracranial
hemorrhage, mass, midline shift, or extra-axial fluid collection.
Mild cerebral atrophy is within normal limits for age. Hypodensities
in the cerebral white matter bilaterally are unchanged and
nonspecific but compatible with mild chronic small vessel ischemic
disease. There are several chronic punctate calcifications
projecting over the left cerebral convexity which may be within
sulci and vascular.

Vascular: Calcified atherosclerosis at the skull base. No hyperdense
vessel.

Skull: No fracture or suspicious osseous lesion.

Sinuses/Orbits: Paranasal sinuses and mastoid air cells are clear.
Bilateral cataract extraction.

Other: None.

ASPECTS (Alberta Stroke Program Early CT Score)

- Ganglionic level infarction (caudate, lentiform nuclei, internal
capsule, insula, M1-M3 cortex): 7

- Supraganglionic infarction (M4-M6 cortex): 3

Total score (0-10 with 10 being normal): 10
IMPRESSION: 1. No evidence of acute intracranial abnormality.
2. ASPECTS is 10.
3. Mild chronic small vessel ischemic disease.

Code stroke imaging results were communicated on 09/16/2020 at [DATE]
to Dr. Toggzz via telephone.

## 2021-09-12 IMAGING — DX DG CHEST 1V PORT
1 series · 1 of 1 positions shown · non-contrast
Comparison: April 01, 2020.

CLINICAL DATA: chest pain

EXAM:
PORTABLE CHEST 1 VIEW

[chest ap]
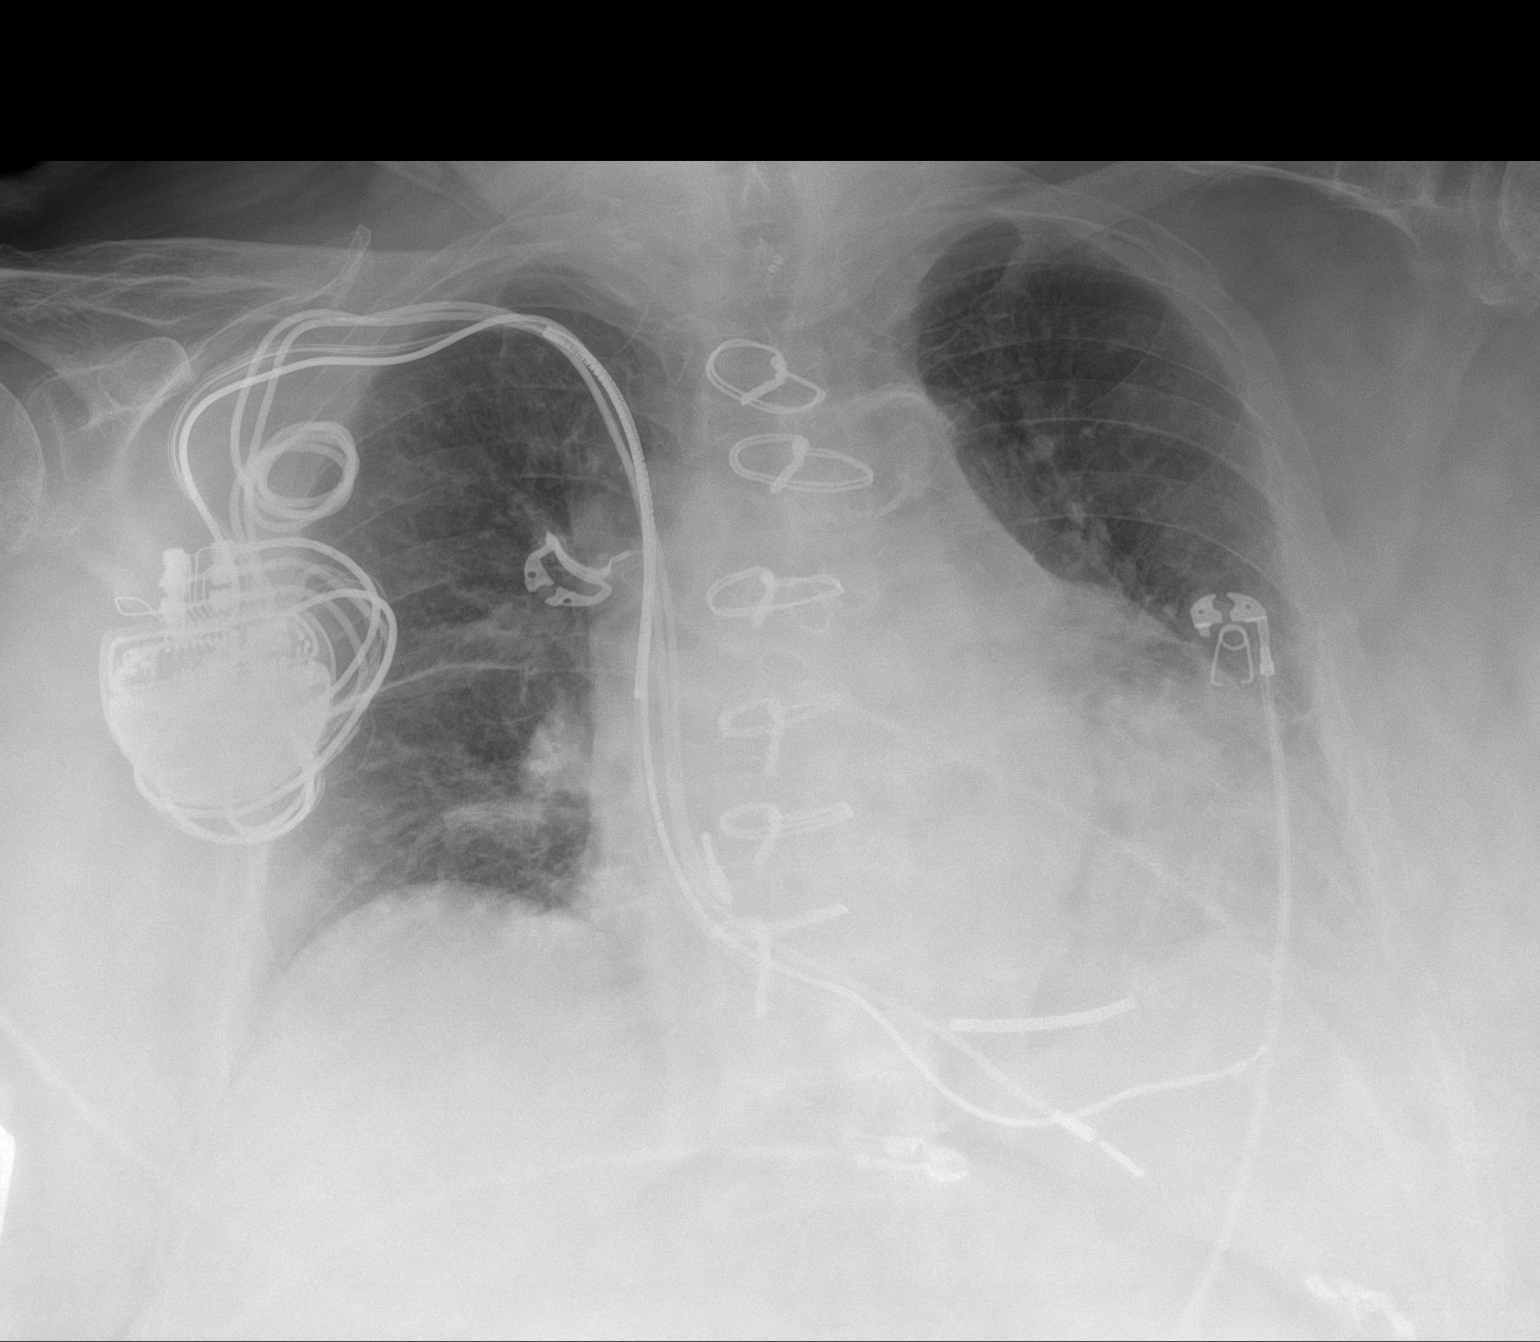

[1 of 1 positions shown; findings below may reference images not displayed]

FINDINGS: Enlarged cardiac silhouette. Central pulmonary vascular congestion.
Tortuous aorta. Median sternotomy and CABG. Calcific atherosclerosis
aorta. Bibasilar and right midlung opacities. Possible trace fluid
layering along the right minor fissure. Otherwise, no visible
pleural effusions or pneumothorax on this single semi erect
radiograph.
IMPRESSION: 1. Cardiomegaly and central pulmonary vascular congestion. Possible
trace fluid layering along the right minor fissure.
2. Low lung volumes with right midlung and bibasilar opacities, most
likely atelectasis. Infection is not excluded.

## 2021-09-12 MED ORDER — HYDROMORPHONE HCL 1 MG/ML IJ SOLN
0.5000 mg | INTRAMUSCULAR | Status: DC | PRN
  Administered 2021-09-12 (×2): 0.5 mg via INTRAVENOUS
  Filled 2021-09-12 (×3): qty 0.5

## 2021-09-12 MED ORDER — KETOTIFEN FUMARATE 0.025 % OP SOLN
1.0000 [drp] | Freq: Two times a day (BID) | OPHTHALMIC | Status: DC
  Administered 2021-09-12 – 2021-09-17 (×11): 1 [drp] via OPHTHALMIC
  Filled 2021-09-12 (×2): qty 5

## 2021-09-12 MED ORDER — OXYCODONE HCL 5 MG PO TABS
5.0000 mg | ORAL_TABLET | ORAL | Status: DC | PRN
  Administered 2021-09-12 – 2021-09-15 (×14): 5 mg via ORAL
  Filled 2021-09-12 (×15): qty 1

## 2021-09-12 NOTE — Care Management Important Message (Signed)
Important Message  Patient Details  Name: [name] MRN: [id number] Date of Birth: 05/14/1945   Medicare Important Message Given:  Yes     Orbie Pyo 09/12/2021, 2:05 PM

## 2021-09-12 NOTE — Anesthesia Postprocedure Evaluation (Signed)
Anesthesia Post Note  Patient: ANAIYAH ANGLEMYER  Procedure(s) Performed: THORACIC SEVEN THROUGH THORACIC ELEVEN  PERCUTANEOUS PEDICLE SCREW PLACEMENT (Spine Thoracic) Application of O-Arm     Patient location during evaluation: PACU Anesthesia Type: General Level of consciousness: awake and alert Pain management: pain level controlled Vital Signs Assessment: post-procedure vital signs reviewed and stable Respiratory status: spontaneous breathing, nonlabored ventilation, respiratory function stable and patient connected to nasal cannula oxygen Cardiovascular status: blood pressure returned to baseline and stable Postop Assessment: no apparent nausea or vomiting Anesthetic complications: no   No notable events documented.  Last Vitals:  Vitals:   09/12/21 0027 09/12/21 0400  BP: 109/61 124/60  Pulse: 73 69  Resp: 16 17  Temp: 36.8 C 36.8 C  SpO2: 98% 98%    Last Pain:  Vitals:   09/12/21 0645  TempSrc:   PainSc: 0-No pain                 Belenda Cruise P Admir Candelas

## 2021-09-12 NOTE — Plan of Care (Signed)
Patient remains in Neuro  PCU overnight. No acute events overnight.   Problem: Education: Goal: Knowledge of General Education information will improve Description: Including pain rating scale, medication(s)/side effects and non-pharmacologic comfort measures Outcome: Progressing   Problem: Health Behavior/Discharge Planning: Goal: Ability to manage health-related needs will improve Outcome: Progressing   Problem: Clinical Measurements: Goal: Ability to maintain clinical measurements within normal limits will improve Outcome: Progressing Goal: Will remain free from infection Outcome: Progressing Goal: Diagnostic test results will improve Outcome: Progressing Goal: Respiratory complications will improve Outcome: Progressing Goal: Cardiovascular complication will be avoided Outcome: Progressing   Problem: Activity: Goal: Risk for activity intolerance will decrease Outcome: Progressing   Problem: Nutrition: Goal: Adequate nutrition will be maintained Outcome: Progressing   Problem: Coping: Goal: Level of anxiety will decrease Outcome: Progressing   Problem: Elimination: Goal: Will not experience complications related to bowel motility Outcome: Progressing Goal: Will not experience complications related to urinary retention Outcome: Progressing   Problem: Pain Managment: Goal: General experience of comfort will improve Outcome: Progressing   Problem: Safety: Goal: Ability to remain free from injury will improve Outcome: Progressing   Problem: Skin Integrity: Goal: Risk for impaired skin integrity will decrease Outcome: Progressing

## 2021-09-12 NOTE — Progress Notes (Signed)
Physical Therapy Treatment Patient Details Name: [name] MRN: [id number] DOB: 29-Feb-1945 Today's Date: 09/12/2021   History of Present Illness pt is a 76 y/o female admitted 7/31 s/p fall sustaining unstable T9-10 fracture s/p T&-T11 posterior pedicle screw fixation.  PMHx:  CAC, AICD, afib, CHF, HTN, IBA, OSA, stroke, DM2,  cabg, multiple Cardiac Caths, L IM nailing L hip, angiograms,    PT Comments    Patient progressing towards acute care goals. Ambulated 2 short bouts with +2 assist for safety. Transfers with min assist +2 for physical assist and safety. Cues for technique throughout. No overt buckling noted however grossly unsteady with RW and distance mainly limited by pain, improved with cues for proximity to AD. Patient will continue to benefit from skilled physical therapy services to further improve independence with functional mobility.    Recommendations for follow up therapy are one component of a multi-disciplinary discharge planning process, led by the attending physician.  Recommendations may be updated based on patient status, additional functional criteria and insurance authorization.  Follow Up Recommendations  Acute inpatient rehab (3hours/day)     Assistance Recommended at Discharge Intermittent Supervision/Assistance  Patient can return home with the following A little help with walking and/or transfers;A little help with bathing/dressing/bathroom;Assistance with cooking/housework;Assist for transportation;Help with stairs or ramp for entrance   Equipment Recommendations  None recommended by PT    Recommendations for Other Services Rehab consult;OT consult     Precautions / Restrictions Precautions Precautions: Fall Restrictions Weight Bearing Restrictions: No     Mobility  Bed Mobility               General bed mobility comments: Sitting EOB when PT entered room    Transfers Overall transfer level: Needs assistance Equipment used: Rolling  walker (2 wheels) Transfers: Sit to/from Stand, Bed to chair/wheelchair/BSC Sit to Stand: Min assist, +2 physical assistance Stand pivot transfers: Min assist, +2 physical assistance         General transfer comment: Min assist +2 for sit<>stand from bed, BSC and initial stand from recliner, cues for technique, hand placement, forward displacement prior to rising. Physical assist for boost. Some slight posterior lean. Min assist with pivot to help sequence RW +2 assist for safety and control. Last trial from recliner pt performed with 1 assist min for stability and transition.    Ambulation/Gait Ambulation/Gait assistance: Min assist, +2 safety/equipment Gait Distance (Feet): 6 Feet (and 12) Assistive device: Rolling walker (2 wheels) Gait Pattern/deviations: Step-through pattern, Decreased stride length, Shuffle, Wide base of support Gait velocity: slow Gait velocity interpretation: <1.31 ft/sec, indicative of household ambulator   General Gait Details: Slow gait pattern, min assist for balance and RW control, navigated 6 feet initial bout, progressed to 12 feet on second bout. +2 assist for safey, chair follow and O2 management, performed on 4L supplemental O2, no overt increase in DOE. Distance limited 2/2 pain. Cues for technique, RW proximity and sequencing throughout.   Stairs             Wheelchair Mobility    Modified Rankin (Stroke Patients Only)       Balance Overall balance assessment: Needs assistance Sitting-balance support: No upper extremity supported, Single extremity supported, Feet supported Sitting balance-Leahy Scale: Fair     Standing balance support: Bilateral upper extremity supported Standing balance-Leahy Scale: Poor Standing balance comment: reliant on external support  Cognition Arousal/Alertness: Awake/alert Behavior During Therapy: WFL for tasks assessed/performed Overall Cognitive Status: Within  Functional Limits for tasks assessed (NT formally)                                          Exercises      General Comments        Pertinent Vitals/Pain Pain Assessment Pain Assessment: 0-10 Pain Score: 7  Pain Location: sx thoracic incision Pain Descriptors / Indicators: Discomfort, Grimacing, Guarding Pain Intervention(s): Limited activity within patient's tolerance, Monitored during session, Repositioned    Home Living Family/patient expects to be discharged to:: Private residence Living Arrangements: Alone;Other (Comment) (son at times) Available Help at Discharge: Family;Available PRN/intermittently;Personal care attendant Type of Home: Mobile home Home Access: Stairs to enter Entrance Stairs-Rails: Left Entrance Stairs-Number of Steps: 4   Home Layout: One level Home Equipment: Tub bench;Rolling Walker (2 wheels);Hospital bed;Wheelchair - manual;Adaptive equipment;Rollator (4 wheels);BSC/3in1 Additional Comments: Wears 2L O2 baseline.Recently increased to 3L in hospital.  Son works during day, not available to assist. her grandson is available.    Prior Function            PT Goals (current goals can now be found in the care plan section) Acute Rehab PT Goals Patient Stated Goal: I want to get home.  in house rehab admission PT Goal Formulation: With patient Time For Goal Achievement: 09/25/21 Potential to Achieve Goals: Good Progress towards PT goals: Progressing toward goals    Frequency    Min 4X/week      PT Plan Current plan remains appropriate    Co-evaluation PT/OT/SLP Co-Evaluation/Treatment: Yes Reason for Co-Treatment: For patient/therapist safety;To address functional/ADL transfers;Complexity of the patient's impairments (multi-system involvement) PT goals addressed during session: Mobility/safety with mobility;Balance;Proper use of DME        AM-PAC PT "6 Clicks" Mobility   Outcome Measure  Help needed turning from  your back to your side while in a flat bed without using bedrails?: A Lot Help needed moving from lying on your back to sitting on the side of a flat bed without using bedrails?: A Lot Help needed moving to and from a bed to a chair (including a wheelchair)?: A Lot Help needed standing up from a chair using your arms (e.g., wheelchair or bedside chair)?: A Little Help needed to walk in hospital room?: A Lot Help needed climbing 3-5 steps with a railing? : A Lot 6 Click Score: 13    End of Session Equipment Utilized During Treatment: Gait belt Activity Tolerance: Patient limited by pain Patient left: in chair;with call bell/phone within reach;with chair alarm set Nurse Communication: Mobility status PT Visit Diagnosis: Other abnormalities of gait and mobility (R26.89);Pain Pain - part of body:  (back)     Time: 6962-9528 PT Time Calculation (min) (ACUTE ONLY): 24 min  Charges:  $Gait Training: 8-22 mins                     Candie Mile, PT    Ellouise Newer 09/12/2021, 12:19 PM

## 2021-09-12 NOTE — Progress Notes (Signed)
Progress Note  Patient: [name]:[id number] DOB: 03/16/1945  DOA: 09/09/2021  DOS: 09/12/2021    Brief hospital course: [name] is a 76 y.o. female with medical history significant of CAD s/p CABG in 2016, HFpEF,s/p CRT-pacemaker, chronic hypoxemic respiratory failure on 2.5 to 3 L, adrenal insufficiency, T2DM, HTN, CKD stage 4, OSA, asthma s/p fall.   Pt was walking with walker and fell when she turned landing on her buttocks and hit her forehead. She is on Plavix. She denies any lightheadedness or dizziness. No chest pain or shortness of breath.    She was found to have an unstable at T9-10 fracture and neurosurgery has deemed it an unstable fracture and will need OR tomorrow.  There is also concerns of nonunion or recurrent fracture at the site of the previous ORIF of the left femur. Family is concerned because of her heart and kidney disease. She was seen earlier this year for hip fracture and postoperatively had ATN briefly requiring dialysis. Access was later removed. However pt tells me that she had follow with nephrology last week and was told she now needs dialysis indefinitely.  She is awaiting callback on next steps.   She currently has AKI with creatinine of 4.75 up from 4.03.  She takes 160 mg of furosemide daily and still appears hypervolemic on exam with lower extremity edema although she reports this is no worse than usual.  She remained on her home 2.5-3 L O2.  Assessment and Plan: Unstable T9 vertebral fracture:  - Neurosurgery consulted, planning percutaneous instrumentation.  - Continue pain control. Will switch IV dilaudid to oral oxycodone.   - Postoperative PT evaluation > CIR recommended, consulted PM&R per protocol, insurance authorization sought.   Stage IV CKD: Pt reports she's told she'll be headed toward dialysis shortly, has no access currently. No acute indications for dialysis.  - Appreciate nephrology following closely. SCr stable,  improved. - Renal diet  Chronic HFmrEF: Recent echo with LVEF 45-50%, apical hypokinesis, normal RV size and function, mildly elevated PASP.  - Appreciate nephrology, cardiology recommendations Re: diuretic dosing. Giving lasix '80mg'$  po qAM, '40mg'$  po qPM. - Continue antihypertensives including metoprolol succinate. - Unable to start ACE/ARB/ARNI w/advanced CKD  Abdominal pain: Currently exam is reassuring, no obstipation, improving. CT inclusive of abd/pelvis on admission showed no significant abnormalities in that area. Lactic acid, lipase, CBC, CMP reassuring.  - Started bowel regimen, get OOB, therapy evaluations. If exam changes, would check KUB. Tolerating diet currently, just no appetite.   CAD s/p CABG 5v Oct 2016, HLD: No recent angina or advancing CHF symptoms.  - Cardiology consulted for perioperative risk evaluation, their assistance is appreciated. No further changes to the plan are recommended.  - NSG ok with continuing DAPT which is recommended to continue lifelong per patient's cardiologist. Will plan to continue aspirin, plavix for now.  - Continue beta blocker, statin.   Chronic hypoxemic respiratory failure, COPD without exacerbation: Remains on baseline 2-3L O2.  - Maintain SpO2 >88% with 2-3L O2.   - Continue symbicort, prn bronchodilators, chronic prednisone continued.  OSA: Intolerant to CPAP  History of fracture of left hip: s/p ORIF in 03/2021 by Dr. Sammuel Hines. CT left hip suggests nonunion, for which patient is at very high risk with comorbidities, chronic steroid.  - Will involve orthopedics to get their opinion once more acute issues addressed. No hardware complication currently evident.  Morbid obesity, OHS: Weight loss recommended.   Iatrogenic adrenal insufficiency: Due to chronic prednisone  for COPD.  - Continue prednisone '10mg'$ . BP actually elevated, so not augmenting steroids at this time.   T2DM: HbA1c 6.3%.   HTN:  - Continue imdur/hydralazine  Right  carotid stenosis: s/p TCAR.   CHB s/p PPM: Noted. With BiV ICD for severe cardiomyopathy 2008, then CRT-P 2014 with improved function.  - Device interrogation post-operatively per report: 0 episode of VT or SVT, 97% RV paced, 98% LV paced, <1% AT/AF, normal device function, good battery life   GERD:  - Continue PPI, carafate  Gout: Quiescent.  - Continue allopurinol  Subjective: Having abdominal fullness, gurgling, no BM yet, +flatus. Pain there is improved overall. Low/mid back pain is improved. No weakness or numbness focally.  Objective: Vitals:   09/12/21 0806 09/12/21 0817 09/12/21 0818 09/12/21 1130  BP: (!) 106/54  (!) 106/54 (!) 116/55  Pulse: 77   73  Resp: '19  19 19  '$ Temp: 98.4 F (36.9 C)  98.4 F (36.9 C) 98.7 F (37.1 C)  TempSrc: Oral  Oral Oral  SpO2: 100% 92% 100% 97%  Weight:      Height:      Gen: 76 y.o. female in no distress Pulm: Nonlabored breathing supplemental oxygen. Clear, decreased at bases. CV: Regular rate and rhythm. No murmur, rub, or gallop. No JVD, no significant dependent edema. GI: Abdomen soft, mildly tender to peep palpation, no rebound or guarding, hyperactive bowel sounds.  Ext: Warm, no deformities Skin: No new rashes, lesions or ulcers on visualized skin. Scattered ecchymoses stable. Surgical site not assessed again this AM. Neuro: Alert and oriented. No focal neurological deficits. Psych: Judgement and insight appear fair. Mood euthymic & affect congruent. Behavior is appropriate.    Data Personally reviewed: CBC: Recent Labs  Lab 09/09/21 1438 09/09/21 1443 09/10/21 0235 09/11/21 0937  WBC 6.5  --  6.4 8.0  NEUTROABS  --   --   --  7.1  HGB 8.5* 9.2* 8.0* 8.1*  HCT 26.7* 27.0* 25.6* 27.1*  MCV 97.1  --  97.0 100.0  PLT 177  --  161 644*   Basic Metabolic Panel: Recent Labs  Lab 09/09/21 1438 09/09/21 1443 09/10/21 0235 09/11/21 0937 09/12/21 0601  NA 134* 132* 138 140 140  K 3.9 3.9 3.2* 3.9 3.8  CL 91* 92* 95*  100 97*  CO2 29  --  31 32 30  GLUCOSE 179* 176* 109* 173* 125*  BUN 118* >130* 109* 92* 94*  CREATININE 4.75* 4.90* 3.99* 3.18* 3.03*  CALCIUM 9.3  --  9.6 9.5 9.5   GFR: Estimated Creatinine Clearance: 16.5 mL/min (A) (by C-G formula based on SCr of 3.03 mg/dL (H)). Liver Function Tests: Recent Labs  Lab 09/09/21 1438 09/11/21 0937 09/12/21 0601  AST 20 55* 26  ALT 16 65* 42  ALKPHOS 93 84 74  BILITOT 0.6 0.6 0.7  PROT 6.5 6.4* 5.5*  ALBUMIN 3.6 3.7 3.3*   Recent Labs  Lab 09/11/21 0937  LIPASE 38   Urine analysis:    Component Value Date/Time   COLORURINE STRAW (A) 09/09/2021 [place] 09/09/2021 1644   LABSPEC 1.006 09/09/2021 1644   PHURINE 6.0 09/09/2021 1644   GLUCOSEU NEGATIVE 09/09/2021 1644   HGBUR NEGATIVE 09/09/2021 1644   BILIRUBINUR NEGATIVE 09/09/2021 1644   KETONESUR NEGATIVE 09/09/2021 1644   PROTEINUR NEGATIVE 09/09/2021 1644   UROBILINOGEN 0.2 12/06/2014 1531   NITRITE NEGATIVE 09/09/2021 1644   LEUKOCYTESUR NEGATIVE 09/09/2021 1644      DG Thoracic  Spine 2 View  Result Date: 09/11/2021 CLINICAL DATA:  Back pain EXAM: THORACIC SPINE 2 VIEWS COMPARISON:  Films from the previous day. FINDINGS: Pedicle screws are again seen extending from T7-T11 stable in appearance from the prior exam. Posterior fixation is now seen. No compression deformities are seen. Mild degenerative changes of the thoracolumbar spine are noted. IMPRESSION: Status post T7-T11 fusion. Electronically Signed   By: Inez Catalina M.D.   On: 09/11/2021 22:14   DG Thoracic Spine 1 View  Result Date: 09/10/2021 CLINICAL DATA:  T7-T11 screw placement. EXAM: OPERATIVE THORACIC SPINE 1 VIEW(S) COMPARISON:  Thoracic spine CT 09/09/2021 FINDINGS: Cross-table lateral view of the thoracic spine demonstrates pedicle screws at T7-T11. IMPRESSION: As above. Electronically Signed   By: Ronney Asters M.D.   On: 09/10/2021 20:11   DG O-ARM IMAGE ONLY/NO REPORT  Result Date:  09/10/2021 There is no Radiologist interpretation  for this exam.    Family Communication: None at bedside  Disposition: Status is: Inpatient Remains inpatient appropriate because: unsafe DC, unable to discharge to previous venue, insurance authorization pending for CIR. Patient is medically stable for transfer there. Planned Discharge Destination: Pursuing CIR   Patrecia Pour, MD 09/12/2021 12:43 PM Page by [email or web address]

## 2021-09-12 NOTE — Progress Notes (Addendum)
Progress Note  Patient Name: [name] Date of Encounter: 09/12/2021  [facility] Cardiologist: Sanda Klein, MD   Subjective   Patient is sitting in chair, c/o back pain, mentioned last night while sleeping, she had few minutes chest pain/tightness, not sure if it was gas discomfort or back pain. She denied worsening of SOB, states her leg edema is improved. She had worked with OT/PT this morning, denied any chest pain with activity.    Inpatient Medications    Scheduled Meds:  allopurinol  100 mg Oral Daily   aspirin EC  81 mg Oral Daily   clopidogrel  75 mg Oral Daily   cyanocobalamin  1,000 mcg Oral Daily   cycloSPORINE  1 drop Both Eyes BID   furosemide  40 mg Oral Daily   furosemide  80 mg Oral Q breakfast   hydrALAZINE  25 mg Oral Q8H   isosorbide mononitrate  90 mg Oral Daily   ketotifen  1 drop Both Eyes BID   metoprolol succinate  50 mg Oral Daily   mometasone-formoterol  2 puff Inhalation BID   pantoprazole  40 mg Oral BID   potassium chloride  20 mEq Oral Q1500   predniSONE  10 mg Oral Q breakfast   rosuvastatin  20 mg Oral QPM   senna-docusate  1 tablet Oral Daily   sucralfate  2 g Oral BID   Continuous Infusions:  sodium chloride Stopped (09/10/21 2020)   PRN Meds: albuterol, HYDROmorphone (DILAUDID) injection, ondansetron (ZOFRAN) IV, polyethylene glycol   Vital Signs    Vitals:   09/12/21 0400 09/12/21 0806 09/12/21 0817 09/12/21 0818  BP: 124/60 (!) 106/54  (!) 106/54  Pulse: 69 77    Resp: '17 19  19  '$ Temp: 98.3 F (36.8 C) 98.4 F (36.9 C)  98.4 F (36.9 C)  TempSrc: Oral Oral  Oral  SpO2: 98% 100% 92% 100%  Weight:      Height:        Intake/Output Summary (Last 24 hours) at 09/12/2021 1023 Last data filed at 09/12/2021 0600 Gross per 24 hour  Intake 240 ml  Output 700 ml  Net -460 ml      09/09/2021    2:31 PM 09/02/2021   11:19 AM 08/21/2021    2:10 PM  Last 3 Weights  Weight (lbs) 198 lb 201 lb 203 lb  Weight (kg)  89.812 kg 91.173 kg 92.08 kg      Telemetry    V paced rhythm  - Personally Reviewed  ECG    No new tracing today - Personally Reviewed  Physical Exam   GEN: No acute distress.  Obese.  Neck: JVD difficult assess due to short and thick neck  Cardiac: RRR, soft murmur  Respiratory: Clear to auscultation bilaterally. On [place] oxygen  GI: Soft, nontender MS: 1- BLE edema  Neuro:  Nonfocal  Psych: Normal affect   Labs    High Sensitivity Troponin:  No results for input(s): "TROPONINIHS" in the last 720 hours.   Chemistry Recent Labs  Lab 09/09/21 1438 09/09/21 1443 09/10/21 0235 09/11/21 0937 09/12/21 0601  NA 134*   < > 138 140 140  K 3.9   < > 3.2* 3.9 3.8  CL 91*   < > 95* 100 97*  CO2 29  --  31 32 30  GLUCOSE 179*   < > 109* 173* 125*  BUN 118*   < > 109* 92* 94*  CREATININE 4.75*   < >  3.99* 3.18* 3.03*  CALCIUM 9.3  --  9.6 9.5 9.5  PROT 6.5  --   --  6.4* 5.5*  ALBUMIN 3.6  --   --  3.7 3.3*  AST 20  --   --  55* 26  ALT 16  --   --  65* 42  ALKPHOS 93  --   --  84 74  BILITOT 0.6  --   --  0.6 0.7  GFRNONAA 9*  --  11* 14* 15*  ANIONGAP 14  --  '12 8 13   '$ < > = values in this interval not displayed.    Lipids No results for input(s): "CHOL", "TRIG", "HDL", "LABVLDL", "LDLCALC", "CHOLHDL" in the last 168 hours.  Hematology Recent Labs  Lab 09/09/21 1438 09/09/21 1443 09/10/21 0235 09/11/21 0937  WBC 6.5  --  6.4 8.0  RBC 2.75*  --  2.64* 2.71*  HGB 8.5* 9.2* 8.0* 8.1*  HCT 26.7* 27.0* 25.6* 27.1*  MCV 97.1  --  97.0 100.0  MCH 30.9  --  30.3 29.9  MCHC 31.8  --  31.3 29.9*  RDW 12.9  --  12.9 13.0  PLT 177  --  161 149*   Thyroid No results for input(s): "TSH", "FREET4" in the last 168 hours.  BNP Recent Labs  Lab 09/09/21 1438  BNP 569.7*    DDimer No results for input(s): "DDIMER" in the last 168 hours.   Radiology    DG Thoracic Spine 2 View  Result Date: 09/11/2021 CLINICAL DATA:  Back pain EXAM: THORACIC SPINE 2 VIEWS COMPARISON:   Films from the previous day. FINDINGS: Pedicle screws are again seen extending from T7-T11 stable in appearance from the prior exam. Posterior fixation is now seen. No compression deformities are seen. Mild degenerative changes of the thoracolumbar spine are noted. IMPRESSION: Status post T7-T11 fusion. Electronically Signed   By: Inez Catalina M.D.   On: 09/11/2021 22:14   DG Thoracic Spine 1 View  Result Date: 09/10/2021 CLINICAL DATA:  T7-T11 screw placement. EXAM: OPERATIVE THORACIC SPINE 1 VIEW(S) COMPARISON:  Thoracic spine CT 09/09/2021 FINDINGS: Cross-table lateral view of the thoracic spine demonstrates pedicle screws at T7-T11. IMPRESSION: As above. Electronically Signed   By: Ronney Asters M.D.   On: 09/10/2021 20:11   DG O-ARM IMAGE ONLY/NO REPORT  Result Date: 09/10/2021 There is no Radiologist interpretation  for this exam.   Cardiac Studies   Echo from 08/05/21:   1. Technically difficult study. Left ventricular ejection fraction, by  estimation, is 45 to 50%. The left ventricle has mildly decreased  function. The left ventricle demonstrates regional wall motion  abnormalities (see scoring diagram/findings for  description). Apical hypokinesis. There is mild left ventricular  hypertrophy. Left ventricular diastolic parameters are indeterminate.   2. Right ventricular systolic function is normal. The right ventricular  size is normal. There is mildly elevated pulmonary artery systolic  pressure. The estimated right ventricular systolic pressure is 02.7 mmHg.   3. The mitral valve is degenerative. No evidence of mitral valve  regurgitation. Moderate mitral annular calcification.   4. The aortic valve was not well visualized. There is moderate  calcification of the aortic valve. Aortic valve regurgitation is trivial.  Mild aortic valve stenosis. Vmax 2.5 m/s, MG 13 mmHg, AVA 2.0 cm^2, DI  0.48   5. The inferior vena cava is dilated in size with <50% respiratory  variability,  suggesting right atrial pressure of 15 mmHg.   Patient Profile  76 y.o. female with a PMH of CAD status post CABG ' 16 (LIMA to LAD, SVG to D1, SVG to OM1 and OM2, SVG to PDA), HFmrEF, adrenal insufficiency, type 2 diabetes, hypertension, CKD stage IV, Chronic hypoxic respiratory failure, OSA, asthma, CHB s/p CRT-P, carotid artery disease s/p TCAR and morbid obesity , who is admitted for unstable T9 vertebral fracture due to fall at home. Cardiology is following for pre-op risk evaluation and post-op evaluation  Assessment & Plan    Unstable T9 vertebral fracture - POD #2, doing well, + pain, waiting for CIR placement   CAD with hx of CABG 2016 - resume lifelong DAPT ASA and Plavix as NSGY has permitted  - continue metoprolol XL '50mg'$  daily and crestor '20mg'$  daily  - had resting chest tightness overnight for few minutes, no exertional chest pain so far, monitor symptoms for now   HFmrEF - Echocardiogram 08/05/21 showed LVEF of 45 to 50%, apical hypokinesis, normal RV size and function, mildly elevated pulmonary artery pressure - no clinical signs of volume overload - continue home dose lasix  - GDMT: continue Toprol XL, Hydralazine, Imdur,  no ARNI/ARB/ACEI/SGLT2I/MRA due to CKD IV  CHB s/p CRT-P [employer]) - Device interrogation post-operatively per report: 0 episode of VT or SVT, 97% RV paced, 98% LV paced, <1% AT/AF, normal device function, good battery life   Stage IV CKD Chronic hypoxemic respiratory failure COPD/asthma  OSA Obesity  Iatrogenic adrenal insufficiency Type 2 DM - per hospital medicine    For questions or updates, please contact [facility] Please consult [email or web address] for contact info under        Signed, Margie Billet, NP  09/12/2021, 10:23 AM    History and all data above reviewed.  Patient examined.  I agree with the findings as above.   Fleeing chest pain as noted.  None with ambulation today.  Did OK with PT today.  Baseline SOB.   The  patient exam reveals COR:RRR  ,  Lungs: Decreased breath sounds at the bases  ,  Abd: Positive bowel sounds, no rebound no guarding, Ext No edema  .  All available labs, radiology testing, previous records reviewed. Agree with documented assessment and plan.   Chronic HF:  Seems to be euvolemic.  No change in therapy.  CAD:  No evidence of active ischemia.  No further work up.  OK to transfer to rehab when OK with primary team.  No further in patient follow up or change in therapy.  Please call with further questions.    Jeneen Rinks Nirvana Blanchett  11:49 AM  09/12/2021

## 2021-09-12 NOTE — Evaluation (Signed)
Occupational Therapy Evaluation Patient Details Name: [name] MRN: [id number] DOB: 05/01/1945 Today's Date: 09/12/2021   History of Present Illness pt is a 76 y/o female admitted 7/31 s/p fall sustaining unstable T9-10 fracture s/p T&-T11 posterior pedicle screw fixation.  PMHx:  CAC, AICD, afib, CHF, HTN, IBA, OSA, stroke, DM2,  cabg, multiple Cardiac Caths, L IM nailing L hip, angiograms,   Clinical Impression   Patient admitted for above and presents with problem list below, including generalized weakness, back pain, decreased activity tolerance and impaired balance.  Pt currently requires mod assist for bed mobility, min assist +2 for transfers and toileting using RW, and max assist for LB Adls.  She will benefit from continued OT services acutely and after dc at AIR level to optimize independence, safety and return to PLOF.    Recommendations for follow up therapy are one component of a multi-disciplinary discharge planning process, led by the attending physician.  Recommendations may be updated based on patient status, additional functional criteria and insurance authorization.   Follow Up Recommendations  Acute inpatient rehab (3hours/day)    Assistance Recommended at Discharge Frequent or constant Supervision/Assistance  Patient can return home with the following A lot of help with walking and/or transfers;Assistance with cooking/housework;A lot of help with bathing/dressing/bathroom;Assist for transportation;Help with stairs or ramp for entrance    Functional Status Assessment  Patient has had a recent decline in their functional status and demonstrates the ability to make significant improvements in function in a reasonable and predictable amount of time.  Equipment Recommendations  BSC/3in1    Recommendations for Other Services       Precautions / Restrictions Precautions Precautions: Fall Restrictions Weight Bearing Restrictions: No      Mobility Bed  Mobility Overal bed mobility: Needs Assistance Bed Mobility: Rolling, Sidelying to Sit Rolling: Mod assist Sidelying to sit: Mod assist, HOB elevated       General bed mobility comments: cueing for technique, assist for rolling and trunk support to ascend    Transfers Overall transfer level: Needs assistance Equipment used: Rolling walker (2 wheels) Transfers: Sit to/from Stand, Bed to chair/wheelchair/BSC Sit to Stand: Min assist, +2 physical assistance Stand pivot transfers: Min assist, +2 physical assistance         General transfer comment: Min assist +2 for sit<>stand from bed, BSC and initial stand from recliner, cues for technique, hand placement, forward displacement prior to rising. Physical assist for boost. Some slight posterior lean. Min assist with pivot to help sequence RW +2 assist for safety and control. Last trial from recliner pt performed with 1 assist min for stability and transition.      Balance Overall balance assessment: Needs assistance Sitting-balance support: No upper extremity supported, Single extremity supported, Feet supported Sitting balance-Leahy Scale: Fair Sitting balance - Comments: limited dynamically'   Standing balance support: During functional activity, Bilateral upper extremity supported Standing balance-Leahy Scale: Poor Standing balance comment: reliant on external support                           ADL either performed or assessed with clinical judgement   ADL Overall ADL's : Needs assistance/impaired     Grooming: Set up;Sitting           Upper Body Dressing : Minimal assistance;Sitting   Lower Body Dressing: Maximal assistance;Sit to/from stand   Toilet Transfer: Minimal assistance;+2 for physical assistance;+2 for safety/equipment   Toileting- Clothing Manipulation and Hygiene: Minimal assistance;+2  for physical assistance;+2 for safety/equipment       Functional mobility during ADLs: Rolling walker (2  wheels);Minimal assistance;+2 for physical assistance;+2 for safety/equipment       Vision   Vision Assessment?: No apparent visual deficits     Perception     Praxis      Pertinent Vitals/Pain Pain Assessment Pain Assessment: 0-10 Pain Score: 7  Pain Location: sx thoracic incision Pain Descriptors / Indicators: Discomfort, Grimacing, Guarding Pain Intervention(s): Limited activity within patient's tolerance, Monitored during session, Repositioned, Premedicated before session     Hand Dominance Right   Extremity/Trunk Assessment Upper Extremity Assessment Upper Extremity Assessment: Generalized weakness   Lower Extremity Assessment Lower Extremity Assessment: Defer to PT evaluation   Cervical / Trunk Assessment Cervical / Trunk Assessment: Kyphotic;Back Surgery   Communication Communication Communication: No difficulties   Cognition Arousal/Alertness: Awake/alert Behavior During Therapy: WFL for tasks assessed/performed Overall Cognitive Status: Within Functional Limits for tasks assessed                                 General Comments: appears WFL, conitnue assessment     General Comments       Exercises     Shoulder Instructions      Home Living Family/patient expects to be discharged to:: Private residence Living Arrangements: Alone;Other (Comment) (son at times) Available Help at Discharge: Family;Available PRN/intermittently;Personal care attendant Type of Home: Mobile home Home Access: Stairs to enter Entrance Stairs-Number of Steps: 4 Entrance Stairs-Rails: Left Home Layout: One level     Bathroom Shower/Tub: Teacher, early years/pre: Handicapped height Bathroom Accessibility: Yes   Home Equipment: Tub bench;Rolling Walker (2 wheels);Hospital bed;Wheelchair - manual;Adaptive equipment;Rollator (4 wheels);BSC/3in1 Adaptive Equipment: Reacher;Long-handled Conservation officer, historic buildings Additional Comments: Wears 2L O2 baseline.Recently  increased to 3L in hospital.  Son works during day, not available to assist. her grandson is available.      Prior Functioning/Environment Prior Level of Function : Needs assist;History of Falls (last six months)       Physical Assist : ADLs (physical)   ADLs (physical): Bathing;Dressing;Toileting;IADLs Mobility Comments: Has been using her RW in home. Reports enjoying watching TV and cooking/housework. has wc for [employer] distances. ADLs Comments: aide assists with IADLs, bathing and dressing 4 hrs/7days week; ind with bathroom transfers/toileting with RW; managing her own medications        OT Problem List: Decreased strength;Decreased activity tolerance;Impaired balance (sitting and/or standing);Decreased coordination;Decreased safety awareness;Pain;Obesity;Decreased knowledge of precautions;Decreased knowledge of use of DME or AE      OT Treatment/Interventions: Self-care/ADL training;Therapeutic exercise;DME and/or AE instruction;Therapeutic activities;Patient/family education;Balance training    OT Goals(Current goals can be found in the care plan section) Acute Rehab OT Goals Patient Stated Goal: get better OT Goal Formulation: With patient Time For Goal Achievement: 09/26/21 Potential to Achieve Goals: Good  OT Frequency: Min 2X/week    Co-evaluation PT/OT/SLP Co-Evaluation/Treatment: Yes Reason for Co-Treatment: For patient/therapist safety;To address functional/ADL transfers PT goals addressed during session: Mobility/safety with mobility;Balance;Proper use of DME OT goals addressed during session: ADL's and self-care      AM-PAC OT "6 Clicks" Daily Activity     Outcome Measure Help from another person eating meals?: None Help from another person taking care of personal grooming?: A Little Help from another person toileting, which includes using toliet, bedpan, or urinal?: A Lot Help from another person bathing (including washing, rinsing, drying)?: A  Lot Help  from another person to put on and taking off regular upper body clothing?: A Little Help from another person to put on and taking off regular lower body clothing?: A Lot 6 Click Score: 16   End of Session Equipment Utilized During Treatment: Rolling walker (2 wheels) Nurse Communication: Mobility status  Activity Tolerance: Patient tolerated treatment well Patient left: in chair;with call bell/phone within reach;with chair alarm set  OT Visit Diagnosis: Other abnormalities of gait and mobility (R26.89);Muscle weakness (generalized) (M62.81);Pain Pain - part of body:  (back)                Time: 1102-1117 OT Time Calculation (min): 34 min Charges:  OT General Charges $OT Visit: 1 Visit OT Evaluation $OT Eval Moderate Complexity: 1 Mod  Jolaine Artist, OT Acute Rehabilitation Services Office [place] 09/12/2021, 12:46 PM

## 2021-09-12 NOTE — Consult Note (Signed)
ORTHOPAEDIC CONSULTATION  REQUESTING PHYSICIAN: Patrecia Pour, MD  Chief Complaint: Left hip pain   HPI: [name] is a 76 y.o. female who presents with left lateral hip pain for several months. She is currently admitted to the hospital status thoracic spinal stabilization for unstable fracture. She states that there has not been any changes recently in her hip pain. She denies any groin pain, predominantly with lateral hip pain. She has been weight bearing on the left leg although it feels "heavy".  Past Medical History:  Diagnosis Date   AICD (automatic cardioverter/defibrillator) present    downgraded to CRT-P in 2014   Anemia    Arthritis    Asthma    Atrial fibrillation ([place]) 10/24/2015   Questionable history of A Fib/A Flutter. On device interrogation on 9/7, showed 0.2mn of A Fib/A Flutter with 1% burden.  Device check in Jan 2018 showed no sustained AF (runs of less than 30 seconds). No anticoagulation indicated.   CAD (coronary artery disease)    a. 10/09/16 LHC: SVG->LAD patent, SVG->Diag patent, SVG->RCA patent, SVG->LCx occluded. EF 60%, b. 10/31/16 LHC DES to AV groove Circ, DES to intermed branch   Chronic lower back pain    Fatty liver disease, nonalcoholic    GERD (gastroesophageal reflux disease)    Gout    H/O hiatal hernia    High cholesterol    Hyperplastic colon polyp    Hypertension    IBS (irritable bowel syndrome)    Internal hemorrhoids    INTERNAL HEMORRHOIDS WITHOUT MENTION COMP 04/12/2007   Qualifier: Diagnosis of  By: BOlevia PerchesMD, Dora M    Nonischemic cardiomyopathy ([facility] 11/22/2011   Pt responded to BiV ICD- last EF 55-60% Sept 2017   Obesity    OSA (obstructive sleep apnea)    "can't tolerate a mask", wears 2L nocturnal O2   Presence of permanent cardiac pacemaker    11/02/12 [employer] V273 INTUA PPM   Stroke ([facility] 09/2020   Type II diabetes mellitus ([facility]    Past Surgical History:  Procedure Laterality Date   ABDOMINAL  ULTRASOUND  12/01/2011   Peripelvic cysts- #1- 2.4x1.9x2.3cm, #2-1.2x0.9x1.2cm   ANKLE FRACTURE SURGERY Right    "had rod put in"   ANTERIOR CERVICAL DECOMP/DISCECTOMY FUSION     APPENDECTOMY     BACK SURGERY     BIOPSY  12/29/2017   Procedure: BIOPSY;  Surgeon: NMauri Pole MD;  [facility];  Service: Endoscopy;;   BIV ICD INSERTION CRT-D  2001?   BIV PACEMAKER GENERATOR CHANGE OUT N/A 11/02/2012   Procedure: BIV PACEMAKER GENERATOR CHANGE OUT;  Surgeon: MSanda Klein MD;  [place];  Service: Cardiovascular;  Laterality: N/A;   CARDIAC CATHETERIZATION  05/17/1999   No significant coronary obstructive disease w/ mild 20% luminal irregularity of the first diag branch of the LAD   CARDIAC CATHETERIZATION  07/08/2002   No significant CAD, moderately depressed LV systolic function   CARDIAC CATHETERIZATION Bilateral 04/26/2007   Normal findings, recommend medical therapy   CARDIAC CATHETERIZATION  02/18/2008   Moderate CAD, would benefit from having a functional study, recommend continue medical therapy   CARDIAC CATHETERIZATION  07/23/2012   Medical therapy   CARDIAC CATHETERIZATION N/A 11/24/2014   Procedure: Left Heart Cath and Coronary Angiography;  Surgeon: TTroy Sine MD;  [place];  Service: Cardiovascular;  Laterality: N/A;   CARDIAC CATHETERIZATION  11/27/2014   Procedure: Intravascular Pressure Wire/FFR Study;  Surgeon:  Peter M Martinique, MD;  Location: [place] CV LAB;  Service: Cardiovascular;;   CARDIAC CATHETERIZATION  10/09/2016   CHOLECYSTECTOMY N/A 04/09/2018   Procedure: LAPAROSCOPIC CHOLECYSTECTOMY;  Surgeon: Greer Pickerel, MD;  [facility];  Service: General;  Laterality: N/A;   COLONOSCOPY WITH PROPOFOL N/A 12/29/2017   Procedure: COLONOSCOPY WITH PROPOFOL;  Surgeon: [name], MD;  [facility];  Service: Endoscopy;  Laterality: N/A;   CORONARY ANGIOGRAM  2010   CORONARY ARTERY BYPASS GRAFT N/A  11/29/2014   Procedure: CORONARY ARTERY BYPASS GRAFTING x 5 (LIMA-LAD, SVG-D, SVG-OM1-OM2, SVG-PD);  Surgeon: Melrose Nakayama, MD;  Location: [place];  Service: Open Heart Surgery;  Laterality: N/A;   CORONARY STENT INTERVENTION N/A 10/31/2016   Procedure: CORONARY STENT INTERVENTION;  Surgeon: Burnell Blanks, MD;  Location: [place] CV LAB;  Service: Cardiovascular;  Laterality: N/A;   ESOPHAGOGASTRODUODENOSCOPY (EGD) WITH PROPOFOL N/A 12/29/2017   Procedure: ESOPHAGOGASTRODUODENOSCOPY (EGD) WITH PROPOFOL;  Surgeon: [name], MD;  [facility];  Service: Endoscopy;  Laterality: N/A;   FRACTURE SURGERY     INSERT / REPLACE / REMOVE PACEMAKER  1999   INTRAMEDULLARY (IM) NAIL INTERTROCHANTERIC Left 04/04/2021   Procedure: INTRAMEDULLARY (IM) NAIL INTERTROCHANTRIC;  Surgeon: Vanetta Mulders, MD;  Location: [place];  Service: Orthopedics;  Laterality: Left;   IR FLUORO GUIDE CV LINE LEFT  04/11/2021   IR US GUIDE VASC ACCESS LEFT  04/11/2021   KNEE ARTHROSCOPY Bilateral    LEFT HEART CATH AND CORS/GRAFTS ANGIOGRAPHY N/A 12/09/2017   Procedure: LEFT HEART CATH AND CORS/GRAFTS ANGIOGRAPHY;  Surgeon: Troy Sine, MD;  Location: [place] CV LAB;  Service: Cardiovascular;  Laterality: N/A;   LEFT HEART CATHETERIZATION WITH CORONARY ANGIOGRAM N/A 07/23/2012   Procedure: LEFT HEART CATHETERIZATION WITH CORONARY ANGIOGRAM;  Surgeon: Leonie Man, MD;  Location: [facility] CATH LAB;  Service: Cardiovascular;  Laterality: N/A;   LEXISCAN MYOVIEW  11/14/2011   Mild-moderate defect seen in Mid Inferolateral and Mid Anterolateral regions-consistant w/ infarct/scar. No significant ischemia demonstrated.   LUMBAR PERCUTANEOUS PEDICLE SCREW 4 LEVEL N/A 09/10/2021   Procedure: THORACIC SEVEN THROUGH THORACIC ELEVEN  PERCUTANEOUS PEDICLE SCREW PLACEMENT;  Surgeon: Judith Part, MD;  Location: [place];  Service: Neurosurgery;  Laterality: N/A;   POLYPECTOMY  12/29/2017   Procedure:  POLYPECTOMY;  Surgeon: [name], MD;  [facility];  Service: Endoscopy;;   PPM GENERATOR CHANGEOUT N/A 12/31/2020   Procedure: PPM GENERATOR CHANGEOUT;  Surgeon: Sanda Klein, MD;  Location: [place] CV LAB;  Service: Cardiovascular;  Laterality: N/A;   RIGHT/LEFT HEART CATH AND CORONARY ANGIOGRAPHY N/A 10/09/2016   Procedure: RIGHT/LEFT HEART CATH AND CORONARY ANGIOGRAPHY;  Surgeon: Jolaine Artist, MD;  Location: [place] CV LAB;  Service: Cardiovascular;  Laterality: N/A;   RIGHT/LEFT HEART CATH AND CORONARY/GRAFT ANGIOGRAPHY N/A 03/11/2019   Procedure: RIGHT/LEFT HEART CATH AND CORONARY/GRAFT ANGIOGRAPHY;  Surgeon: Jolaine Artist, MD;  Location: [place] CV LAB;  Service: Cardiovascular;  Laterality: N/A;   TEE WITHOUT CARDIOVERSION N/A 11/29/2014   Procedure: TRANSESOPHAGEAL ECHOCARDIOGRAM (TEE);  Surgeon: Melrose Nakayama, MD;  Location: [place];  Service: Open Heart Surgery;  Laterality: N/A;   TRANSCAROTID ARTERY REVASCULARIZATION  Right 10/01/2020   Procedure: RIGHT TRANSCAROTID ARTERY REVASCULARIZATION;  Surgeon: [name], MD;  Location: [place];  Service: Vascular;  Laterality: Right;   TRANSTHORACIC ECHOCARDIOGRAM  07/23/2012   EF 55-60%, normal-mild   TUBAL LIGATION     ULTRASOUND GUIDANCE FOR  VASCULAR ACCESS Left 10/01/2020   Procedure: ULTRASOUND GUIDANCE FOR VASCULAR ACCESS;  Surgeon: [name], MD;  Location: [facility] OR;  Service: Vascular;  Laterality: Left;   Social History   Socioeconomic History   Marital status: Widowed    Spouse name: Brenee Gajda- deceased   Number of children: 5   Years of education: Not on file   Highest education level: Not on file  Occupational History   Occupation: STAFF/BUFFET    Employer: [employer]  Tobacco Use   Smoking status: Former    Packs/day: 0.25    Years: 3.00    Total pack years: 0.75    Types: Cigarettes    Quit date: 02/11/1968    Years since  quitting: 53.6   Smokeless tobacco: Never  Vaping Use   Vaping Use: Never used  Substance and Sexual Activity   Alcohol use: No   Drug use: No   Sexual activity: Never  Other Topics Concern   Not on file  Social History Narrative   Widowed last year. Spouse Mike Gip Sr (22) died on date 01/22/2012 2022/05/08) at his home Was a [place] [employer] [place] [employer]   She lives with her two sons, Thayer Ohm    Other sons Haze Rushing   Social Determinants of Health   Financial Resource Strain: High Risk (12/18/2016)   Overall Financial Resource Strain (CARDIA)    Difficulty of Paying Living Expenses: Very hard  Food Insecurity: No Food Insecurity (01/15/2021)   Hunger Vital Sign    Worried About Running Out of Food in the Last Year: Never true    [place] in the Last Year: Never true  Transportation Needs: No Transportation Needs (02/05/2021)   PRAPARE - Hydrologist (Medical): No    Lack of Transportation (Non-Medical): No  Physical Activity: Inactive (12/18/2016)   Exercise Vital Sign    Days of Exercise per Week: 0 days    Minutes of Exercise per Session: 0 min  Stress: Stress Concern Present (12/18/2016)   [place]    Feeling of Stress : Very much  Social Connections: Moderately Integrated (02/05/2021)   Social Connection and Isolation Panel [NHANES]    Frequency of Communication with Friends and Family: Three times a week    Frequency of Social Gatherings with Friends and Family: Three times a week    Attends Religious Services: 1 to 4 times per year    Active Member of Clubs or Organizations: Yes    Attends Archivist Meetings: 1 to 4 times per year    Marital Status: Widowed   Family History  Problem Relation Age of Onset   Breast cancer Mother    Diabetes Mother    Asthma Sister    Heart disease Maternal Grandmother    Kidney disease  Maternal Grandmother    Diabetes Maternal Grandmother    Glaucoma Maternal Aunt    Heart disease Maternal Aunt    Colon cancer Neg Hx    Stomach cancer Neg Hx    Pancreatic cancer Neg Hx    - negative except otherwise stated in the family history section Allergies  Allergen Reactions   Ivp Dye [Iodinated Contrast Media] Shortness Of Breath    No reaction to PO contrast with non-ionic dye.06-25-2014/rsm   Shellfish Allergy Anaphylaxis   Sulfa Antibiotics Shortness Of Breath   Iodine Hives   Atorvastatin  Other (See Comments)    Pt states "causes bilateral leg pain/cramps."   Benadryl [Diphenhydramine] Other (See Comments)    "THIS DRIVES ME CRAZY AND MAKES ME FEEL LIKE I AM DYING"    Colchicine Nausea And Vomiting   Doxycycline Other (See Comments)    Unknown reaction, per patient   Uloric [Febuxostat] Other (See Comments)    Unknown reaction   Cephalexin Itching and Rash   Zithromax [Azithromycin] Rash   Prior to Admission medications   Medication Sig Start Date End Date Taking? Authorizing Provider  acetaminophen (TYLENOL) 500 MG tablet Take 1,000 mg by mouth 3 (three) times daily as needed (for pain or headaches).   Yes [provider]  albuterol (PROVENTIL HFA;VENTOLIN HFA) 108 (90 Base) MCG/ACT inhaler Inhale 2 puffs into the lungs every 6 (six) hours as needed for wheezing or shortness of breath. 05/02/18  Yes Fuller Plan A, MD  albuterol (PROVENTIL) (2.5 MG/3ML) 0.083% nebulizer solution Take 3 mLs (2.5 mg total) by nebulization every 6 (six) hours as needed for wheezing or shortness of breath. 05/02/18  Yes Fuller Plan A, MD  allopurinol (ZYLOPRIM) 100 MG tablet Take 1 tablet (100 mg total) by mouth daily. Patient taking differently: Take 100 mg by mouth every morning. 03/14/21  Yes Croitoru, [name], MD  aspirin EC 81 MG tablet Take 1 tablet (81 mg total) by mouth daily. Patient taking differently: Take 81 mg by mouth every morning. 03/27/16  Yes Kilroy, Luke K,  PA-C  budesonide-formoterol (SYMBICORT) 160-4.5 MCG/ACT inhaler Inhale 2 puffs into the lungs 2 (two) times daily.   Yes [provider]  clopidogrel (PLAVIX) 75 MG tablet Take 1 tablet (75 mg total) by mouth daily. Patient taking differently: Take 75 mg by mouth every morning. 02/28/21  Yes Croitoru, [name], MD  cycloSPORINE (RESTASIS) 0.05 % ophthalmic emulsion Place 1 drop into both eyes 2 (two) times daily.   Yes [provider]  EPINEPHrine 0.3 mg/0.3 mL IJ SOAJ injection Inject 0.3 mg into the muscle once as needed for anaphylaxis (severe allergic reaction).   Yes [provider]  furosemide (LASIX) 80 MG tablet Take 2 tablets (160 mg total) by mouth daily. 08/16/21 10/15/21 Yes Gherghe, Vella Redhead, MD  hydrALAZINE (APRESOLINE) 25 MG tablet Take 1 tablet (25 mg total) by mouth every 8 (eight) hours. 08/15/21  Yes Caren Griffins, MD  hydrOXYzine (ATARAX) 25 MG tablet Take 25 mg by mouth 2 (two) times daily as needed for itching. 06/07/21  Yes [provider]  isosorbide mononitrate (IMDUR) 30 MG 24 hr tablet TAKE 3 TABLETS BY MOUTH  DAILY Patient taking differently: Take 90 mg by mouth every morning. 05/15/21  Yes Croitoru, [name], MD  Lidocaine 0.5 % AERO Place 1 spray rectally every 6 (six) hours as needed (hemorrhoids). Preparation H rapid relief lidocaine spray   Yes [provider]  metoprolol succinate (TOPROL-XL) 50 MG 24 hr tablet Take 1 tablet (50 mg total) by mouth daily. Take with or immediately following a meal. 08/16/21  Yes Gherghe, Vella Redhead, MD  nitroGLYCERIN (NITROSTAT) 0.4 MG SL tablet Place 1 tablet (0.4 mg total) under the tongue every 5 (five) minutes as needed for chest pain. 11/13/16 03/18/22 Yes Kilroy, Luke K, PA-C  ondansetron (ZOFRAN) 4 MG tablet Take 4 mg by mouth daily as needed for nausea or vomiting. 07/24/21  Yes [provider]  OXYGEN Inhale 2-3 L/min into the lungs continuous.   Yes [provider]  oxymetazoline  (AFRIN) 0.05 %  nasal spray Place 1 spray into both nostrils 2 (two) times daily as needed for congestion.   Yes [provider]  pantoprazole (PROTONIX) 40 MG tablet Take 1 tablet (40 mg total) by mouth 2 (two) times daily. 07/08/19  Yes Nandigam, Venia Minks, MD  polyethylene glycol (MIRALAX / GLYCOLAX) 17 g packet Take 17 g by mouth daily as needed (constipation).   Yes [provider]  Potassium Chloride ER 20 MEQ TBCR Take 20 mEq by mouth daily in the afternoon. 08/29/21  Yes [provider]  predniSONE (DELTASONE) 10 MG tablet Take 1 tablet (10 mg total) by mouth daily with breakfast. 08/15/21  Yes Gherghe, Vella Redhead, MD  rosuvastatin (CRESTOR) 20 MG tablet Take 1 tablet (20 mg total) by mouth every evening. NEED OV. 06/25/21  Yes Croitoru, [name], MD  sucralfate (CARAFATE) 1 g tablet Take 1 tablet (1 g total) by mouth 4 (four) times daily -  with meals and at bedtime. Patient taking differently: Take 2 g by mouth 2 (two) times daily. 04/28/21  Yes Gherghe, Vella Redhead, MD  traMADol (ULTRAM) 50 MG tablet Take 50 mg by mouth daily as needed (pain). 06/07/21  Yes [provider]  vitamin B-12 (CYANOCOBALAMIN) 1000 MCG tablet Take 1 tablet (1,000 mcg total) by mouth daily. 08/15/21  Yes Caren Griffins, MD  ACCU-CHEK AVIVA PLUS test strip Check blood sugar twice daily 10/19/19   [provider]  Accu-Chek Softclix Lancets lancets Check blood sugar twice daily 08/10/19   [provider]  Alcohol Swabs (B-D SINGLE USE SWABS REGULAR) PADS  08/10/19   [provider]  linaclotide Rolan Lipa) 145 MCG CAPS capsule Take 145 mcg by mouth daily before breakfast. Patient not taking: Reported on 09/02/2021    [provider]   DG Thoracic Spine 2 View  Result Date: 09/11/2021 CLINICAL DATA:  Back pain EXAM: THORACIC SPINE 2 VIEWS COMPARISON:  Films from the previous day. FINDINGS: Pedicle screws are again seen extending from T7-T11 stable in appearance from  the prior exam. Posterior fixation is now seen. No compression deformities are seen. Mild degenerative changes of the thoracolumbar spine are noted. IMPRESSION: Status post T7-T11 fusion. Electronically Signed   By: Inez Catalina M.D.   On: 09/11/2021 22:14   DG Thoracic Spine 1 View  Result Date: 09/10/2021 CLINICAL DATA:  T7-T11 screw placement. EXAM: OPERATIVE THORACIC SPINE 1 VIEW(S) COMPARISON:  Thoracic spine CT 09/09/2021 FINDINGS: Cross-table lateral view of the thoracic spine demonstrates pedicle screws at T7-T11. IMPRESSION: As above. Electronically Signed   By: Ronney Asters M.D.   On: 09/10/2021 20:11   DG O-ARM IMAGE ONLY/NO REPORT  Result Date: 09/10/2021 There is no Radiologist interpretation  for this exam.    Positive ROS: All other systems have been reviewed and were otherwise negative with the exception of those mentioned in the HPI and as above.  Physical Exam: General: No acute distress Cardiovascular: No pedal edema Respiratory: No cyanosis, no use of accessory musculature GI: No organomegaly, abdomen is soft and non-tender Skin: No lesions in the area of chief complaint Neurologic: Sensation intact distally Psychiatric: Patient is at baseline mood and affect Lymphatic: No axillary or cervical lymphadenopathy  MUSCULOSKELETAL:  Left hip with no tenderness with 20 IR/ER logroll. No pain with passive flexion of the hip. SILT distally. 2+ DP pulse  Independent Imaging Review: CT left hip: Status post left hip cephulomedullary nail with evidence of atrophic nonunion  Assessment: 76 year old female status post left hip intertrochanteric  nailing now with evidence of atrophic nonunion.  I did describe with her that we will need to keep an eye on this.  At this time I believe that a revision nailing would be quite a morbid procedure for her and not one that I necessarily think would provide significant relief.  She may continue to be weightbearing as tolerated on the left  leg and I will plan to see her for follow-up my office. No surgical intervention required at this time.  Thank you for the consult and the opportunity to see Ms. Deon Pilling, MD [facility] 3:28 PM

## 2021-09-12 NOTE — Progress Notes (Signed)
  Inpatient Rehabilitation Admissions Coordinator   Met with patient at bedside and spoke with son, Laverna Peace, by phone for rehab assessment. We discussed goals and expectations of a possible CIR admit. I also discussed current masking and visitor guidelines due to COVID concerns on CIR. They prefer CIR for rehab. Family can provide expected caregiver support that is recommended of supervision to min assist level. I will begin insurance Auth with [place] for possible CIR admit pending approval. Please call me with any questions.   Danne Baxter, RN, MSN Rehab Admissions Coordinator [phone]

## 2021-09-12 NOTE — Progress Notes (Addendum)
Doing well, back pain improving, was able to sit up and eat yesterday with a dramatic improvement in pain from preop, exam stable, xrays show hardware in good position, no new complaints, no change in neurosurgical plan of care.

## 2021-09-13 ENCOUNTER — Inpatient Hospital Stay (HOSPITAL_COMMUNITY): Payer: Medicare Other

## 2021-09-13 DIAGNOSIS — S22079A Unspecified fracture of T9-T10 vertebra, initial encounter for closed fracture: Secondary | ICD-10-CM | POA: Diagnosis not present

## 2021-09-13 DIAGNOSIS — I5032 Chronic diastolic (congestive) heart failure: Secondary | ICD-10-CM | POA: Diagnosis not present

## 2021-09-13 DIAGNOSIS — N179 Acute kidney failure, unspecified: Secondary | ICD-10-CM | POA: Diagnosis not present

## 2021-09-13 DIAGNOSIS — Z950 Presence of cardiac pacemaker: Secondary | ICD-10-CM | POA: Diagnosis not present

## 2021-09-13 LAB — TYPE AND SCREEN
ABO/RH(D): O POS
Antibody Screen: NEGATIVE
Unit division: 0
Unit division: 0

## 2021-09-13 LAB — BPAM RBC
Blood Product Expiration Date: 202308282359
Blood Product Expiration Date: 202308282359
ISSUE DATE / TIME: 202307271040
Unit Type and Rh: 5100
Unit Type and Rh: 5100

## 2021-09-13 IMAGING — CT CT HEAD W/O CM
4 series · 16 of 47 positions shown, 18 images · non-contrast
Comparison: CT head without contrast 09/16/2020

CLINICAL DATA: Stroke, follow-up.

EXAM:
CT HEAD WITHOUT CONTRAST
TECHNIQUE: Contiguous axial images were obtained from the base of the skull
through the vertex without intravenous contrast.

[Series 3: head without · axial · non-contrast · 0.44mm/px · z∈[+1396,+1516]mm · 7 of 32 slices shown, 9 images]
[im 4/32  brain]
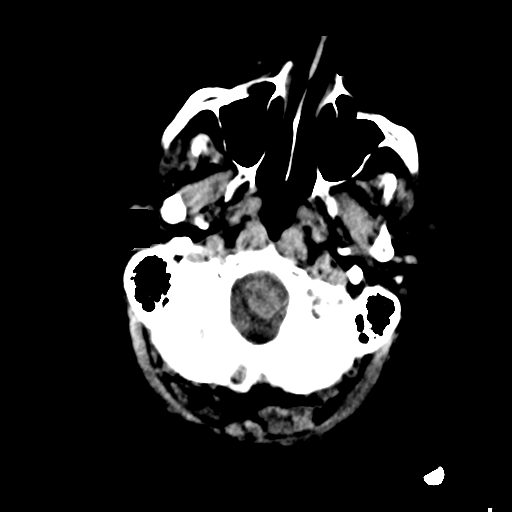
[im 4/32  bone]
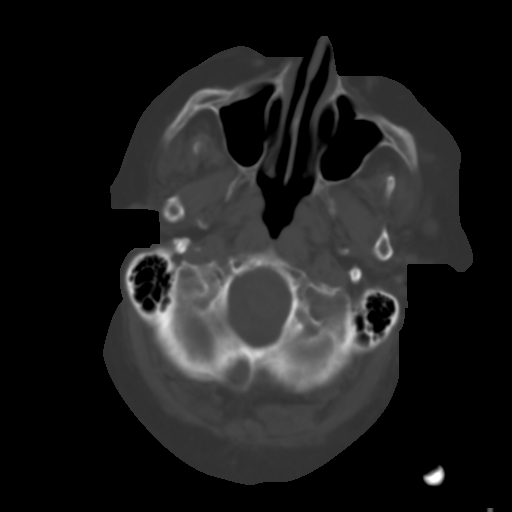
[im 8/32  brain]
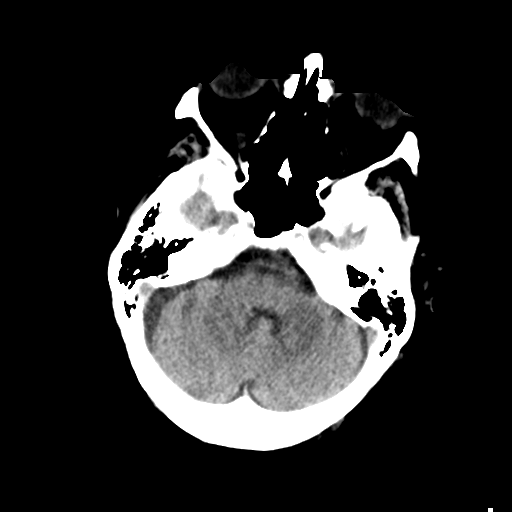
[im 12/32  brain]
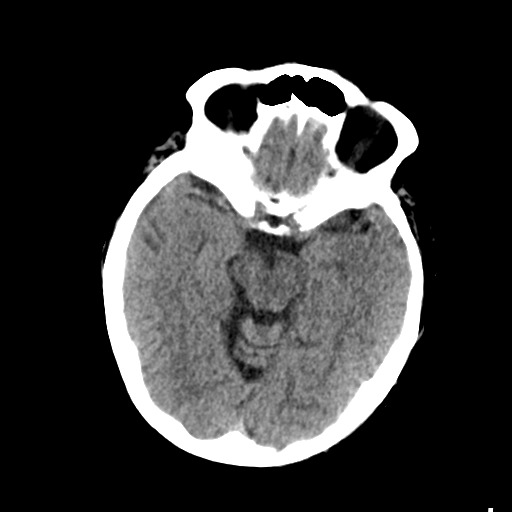
[im 16/32  brain]
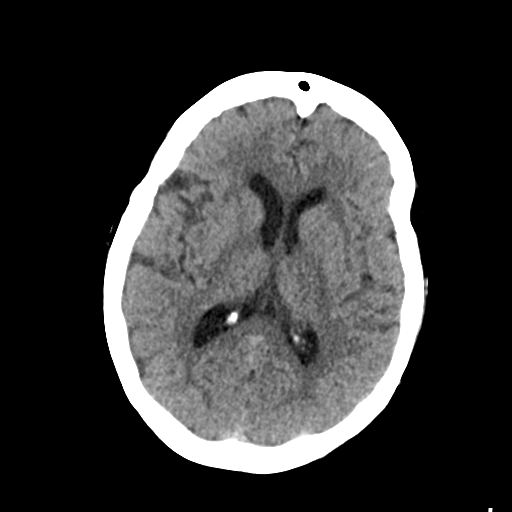
[im 20/32  brain]
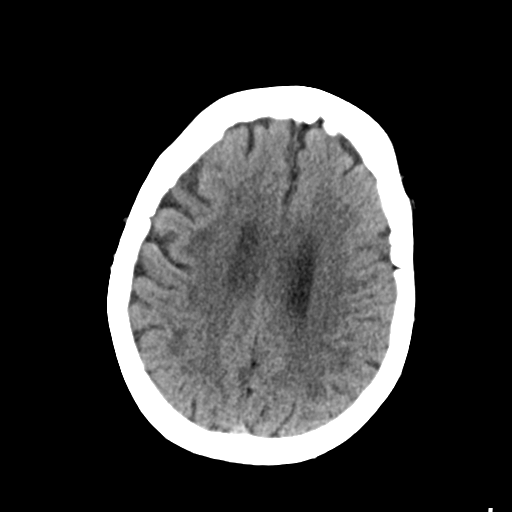
[im 20/32  bone]
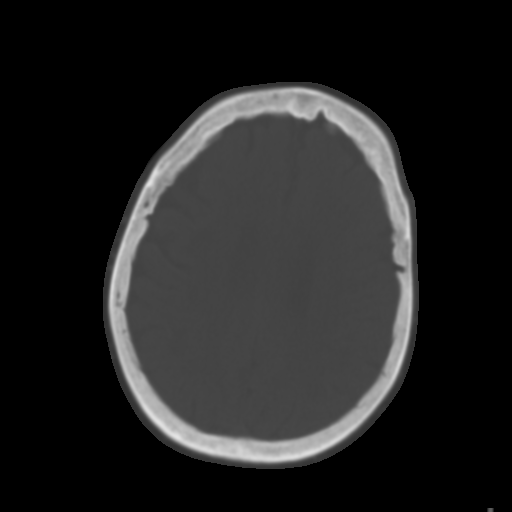
[im 24/32  brain]
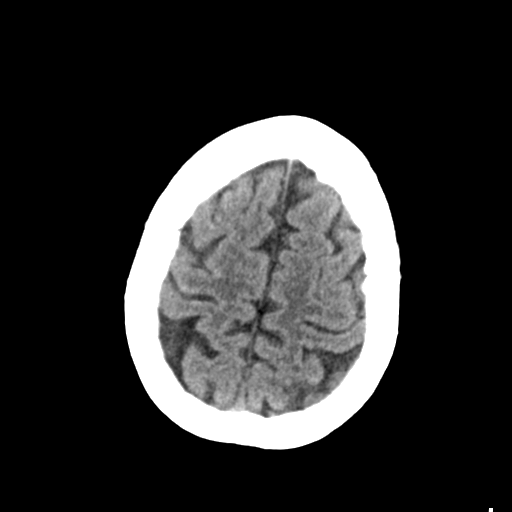
[im 28/32  brain]
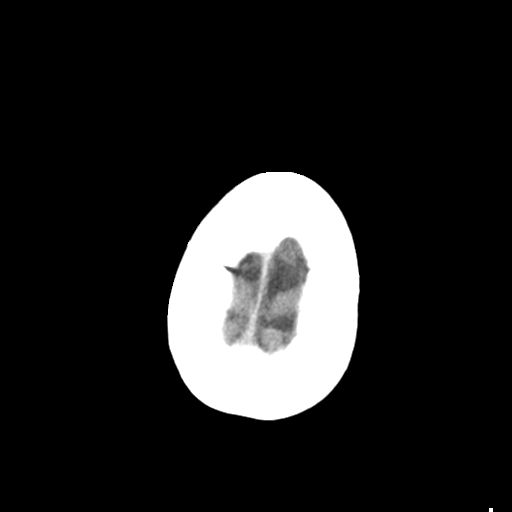

[Series 4: head bone · axial · 0.44mm/px · z∈[+1395,+1427]mm · 3 of 79 slices shown]
[im 8/79  bone]
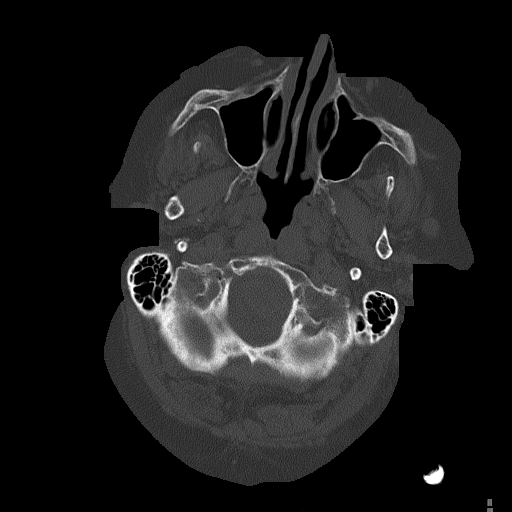
[im 16/79  bone]
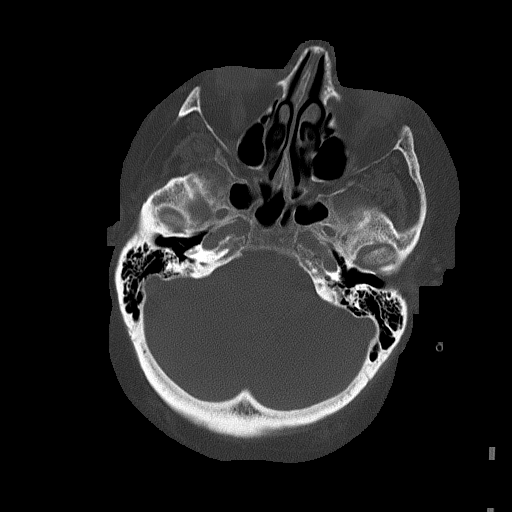
[im 24/79  bone]
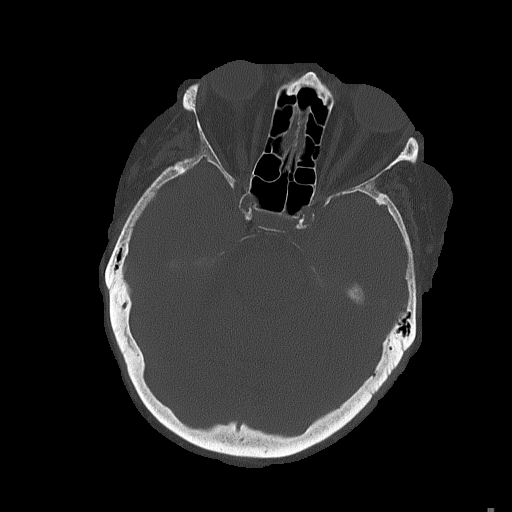

[Series 5: head without cor · coronal · non-contrast · 0.32mm/px · 3 of 64 slices shown]
[im 22/64  brain]
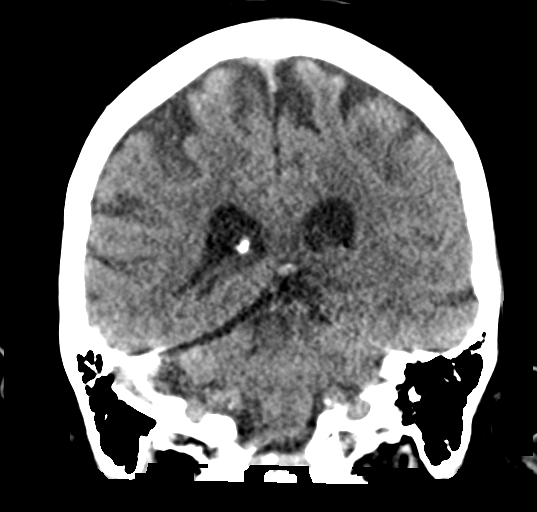
[im 29/64  brain]
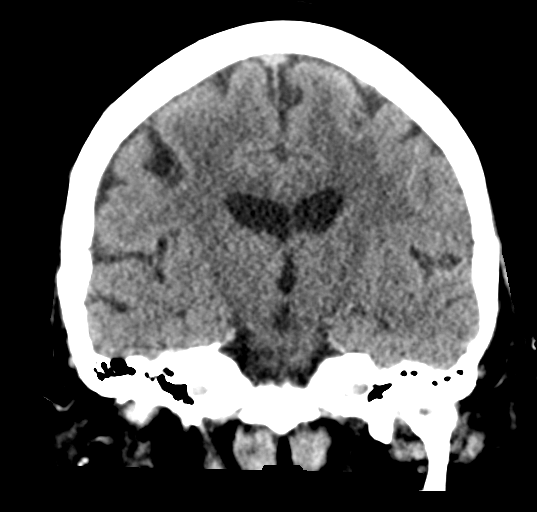
[im 36/64  brain]
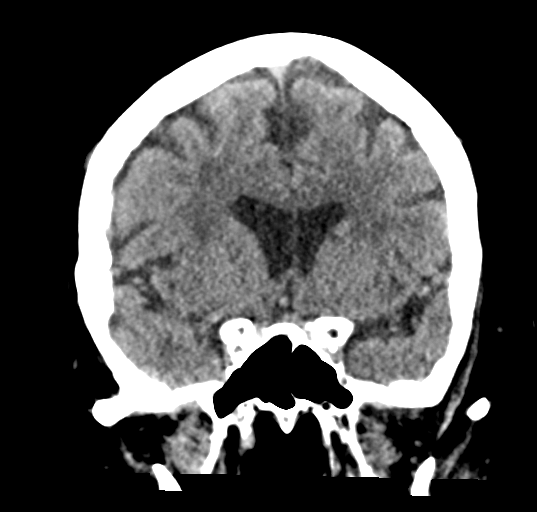

[Series 6: head without sag · sagittal · non-contrast · 0.32mm/px · 3 of 51 slices shown]
[im 17/51  brain]
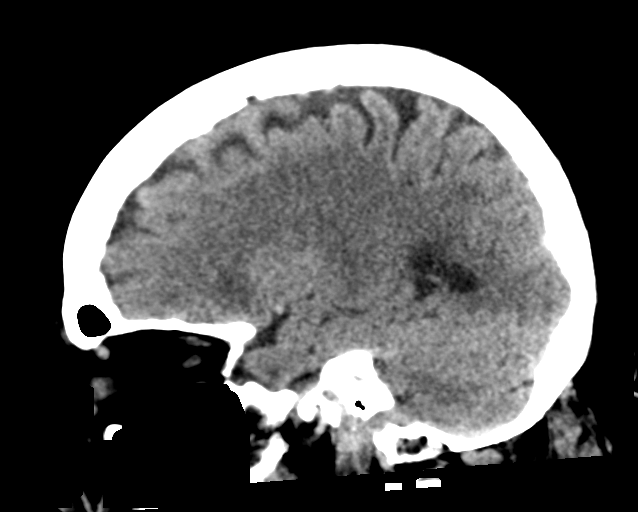
[im 26/51  brain]
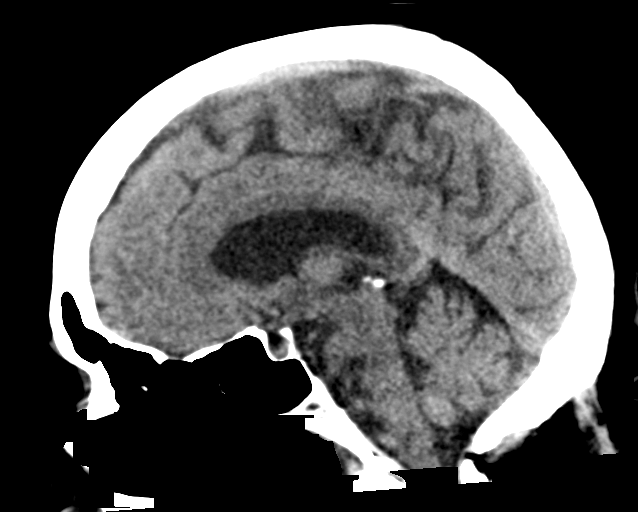
[im 34/51  brain]
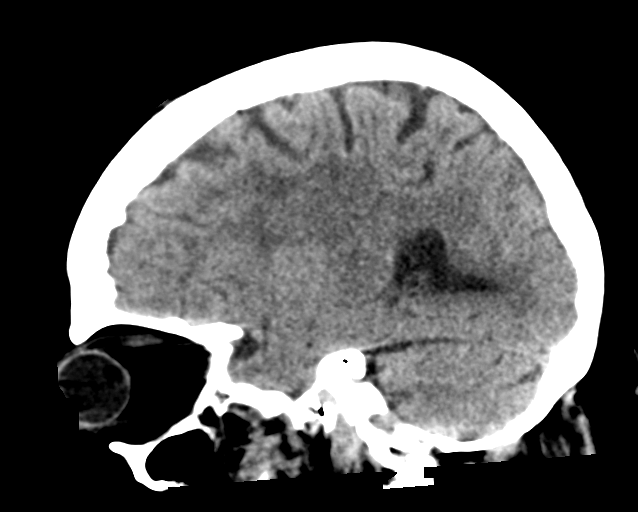

[16 of 47 positions shown; findings below may reference images not displayed]

FINDINGS: Brain: No acute infarct, hemorrhage, or mass lesion is present.
Moderate white matter changes are stable. The ventricles are of
normal size. No significant extraaxial fluid collection is present.
The brainstem and cerebellum are within normal limits.

Vascular: Atherosclerotic changes are again noted within the
cavernous internal carotid arteries bilaterally. No hyperdense
vessel is present.

Skull: Calvarium is intact. No focal lytic or blastic lesions are
present. No significant extracranial soft tissue lesion is present.

Sinuses/Orbits: The paranasal sinuses and mastoid air cells are
clear. Bilateral lens replacements are noted. Globes and orbits are
otherwise unremarkable.
IMPRESSION: 1. Stable moderate white matter disease. This likely reflects the
sequela of chronic microvascular ischemia.
2. No acute intracranial abnormality or significant interval change.

## 2021-09-13 MED ORDER — BISACODYL 10 MG RE SUPP
10.0000 mg | Freq: Once | RECTAL | Status: AC
  Administered 2021-09-13: 10 mg via RECTAL
  Filled 2021-09-13: qty 1

## 2021-09-13 MED ORDER — SENNOSIDES-DOCUSATE SODIUM 8.6-50 MG PO TABS
2.0000 | ORAL_TABLET | Freq: Every day | ORAL | Status: DC
  Administered 2021-09-13 – 2021-09-15 (×3): 2 via ORAL
  Filled 2021-09-13 (×3): qty 2

## 2021-09-13 MED ORDER — OXYMETAZOLINE HCL 0.05 % NA SOLN
1.0000 | Freq: Two times a day (BID) | NASAL | Status: AC
Start: 2021-09-13 — End: 2021-09-16
  Administered 2021-09-13 – 2021-09-16 (×4): 1 via NASAL
  Filled 2021-09-13 (×3): qty 30

## 2021-09-13 MED ORDER — POLYETHYLENE GLYCOL 3350 17 G PO PACK
17.0000 g | PACK | Freq: Every day | ORAL | Status: DC
  Administered 2021-09-14: 17 g via ORAL
  Filled 2021-09-13 (×3): qty 1

## 2021-09-13 NOTE — Progress Notes (Signed)
No IV needed per Bonner Puna, MD

## 2021-09-13 NOTE — Consult Note (Signed)
   [facility] Inpatient Consult   09/13/2021  [name] 06-09-1945 [id number]  [place] Organization [ACO] Patient: [name]  Primary Care Provider:  Lujean Amel, MD,  is with [facility], is an embedded provider with a Chronic Care Management team and program, and is listed for the transition of care follow up and appointments.  Patient was screened for less than 30 days and referral request for readmission report review.   Plan: Review was sent and now patient is to transition to CIR per notes. No current transition needs.  Please contact for further questions,  Natividad Brood, RN BSN [facility] Liaison  [phone] business mobile phone Toll free office [phone]  Fax number: [phone] [email or web address]

## 2021-09-13 NOTE — Progress Notes (Addendum)
Inpatient Rehabilitation Admissions Coordinator   I await insurance approval for possible CIR admit. I met with patient at bedside and she is aware.  Danne Baxter, RN, MSN Rehab Admissions Coordinator [phone] 09/13/2021 11:21 AM  I sent secure chat to Dr Bonner Puna requesting he complete a peer to peer with Bernadene Bell MD  by calling [phone] option #5. Call with name, dob and ID of #[id number].

## 2021-09-13 NOTE — Progress Notes (Signed)
Physical Therapy Treatment Patient Details Name: [name] MRN: [id number] DOB: 07/05/1945 Today's Date: 09/13/2021   History of Present Illness pt is a 76 y/o female admitted 7/31 s/p fall sustaining unstable T9-10 fracture s/p T&-T11 posterior pedicle screw fixation.  PMHx:  CAC, AICD, afib, CHF, HTN, IBA, OSA, stroke, DM2,  cabg, multiple Cardiac Caths, L IM nailing L hip, angiograms,    PT Comments    Progressing towards acute functional goals. Reviewed bed mobility and transfers with mod and min assist respectively. Gait training, tolerated longer distance today but still limited by pain at 18 feet. Focused on therex for strengthening. Encouraged performing between therapy sessions. Patient will continue to benefit from skilled physical therapy services to further improve independence with functional mobility.    Recommendations for follow up therapy are one component of a multi-disciplinary discharge planning process, led by the attending physician.  Recommendations may be updated based on patient status, additional functional criteria and insurance authorization.  Follow Up Recommendations  Acute inpatient rehab (3hours/day)     Assistance Recommended at Discharge Intermittent Supervision/Assistance  Patient can return home with the following A little help with walking and/or transfers;A little help with bathing/dressing/bathroom;Assistance with cooking/housework;Assist for transportation;Help with stairs or ramp for entrance   Equipment Recommendations  None recommended by PT    Recommendations for Other Services Rehab consult;OT consult     Precautions / Restrictions Precautions Precautions: Fall Restrictions Weight Bearing Restrictions: No     Mobility  Bed Mobility Overal bed mobility: Needs Assistance Bed Mobility: Rolling, Sidelying to Sit Rolling: Mod assist Sidelying to sit: Mod assist, HOB elevated       General bed mobility comments: Mod assist to  roll and provide trunk support to rise. Cues for log roll technique.    Transfers Overall transfer level: Needs assistance Equipment used: Rolling walker (2 wheels) Transfers: Sit to/from Stand Sit to Stand: Min assist           General transfer comment: Min assist for boost to stand from bed. Cues for hand placement and technique. Slight posterior lean upon standing, stabilizes with UEs onto RW.    Ambulation/Gait Ambulation/Gait assistance: Min assist Gait Distance (Feet): 18 Feet Assistive device: Rolling walker (2 wheels) Gait Pattern/deviations: Step-through pattern, Decreased stride length, Shuffle, Wide base of support Gait velocity: slow Gait velocity interpretation: <1.31 ft/sec, indicative of household ambulator   General Gait Details: Gait training with cues for symmetry of gait, VC for posture and proximity to walker. Shows improved control and step sequencing, some instability noted requiring min assist with turns and approach to chair prior to sitting. Educated on safety and AD use.   Stairs             Wheelchair Mobility    Modified Rankin (Stroke Patients Only)       Balance Overall balance assessment: Needs assistance Sitting-balance support: No upper extremity supported, Single extremity supported, Feet supported Sitting balance-Leahy Scale: Fair Sitting balance - Comments: limited dynamically'   Standing balance support: During functional activity, Bilateral upper extremity supported Standing balance-Leahy Scale: Poor Standing balance comment: reliant on external support                            Cognition Arousal/Alertness: Awake/alert Behavior During Therapy: WFL for tasks assessed/performed Overall Cognitive Status: Within Functional Limits for tasks assessed  Exercises General Exercises - Lower Extremity Ankle Circles/Pumps: AROM, Both, 20 reps, Seated Long Arc  Quad: Strengthening, Both, 15 reps, Seated Hip ABduction/ADduction: Strengthening, Both, 15 reps, Seated Other Exercises Other Exercises: scapular retraction x15    General Comments        Pertinent Vitals/Pain Pain Assessment Pain Assessment: 0-10 Pain Score: 7  Pain Location: sx thoracic incision Pain Descriptors / Indicators: Discomfort, Grimacing, Guarding Pain Intervention(s): Limited activity within patient's tolerance, Monitored during session, Repositioned    Home Living                          Prior Function            PT Goals (current goals can now be found in the care plan section) Acute Rehab PT Goals Patient Stated Goal: I want to get home.  in house rehab admission PT Goal Formulation: With patient Time For Goal Achievement: 09/25/21 Potential to Achieve Goals: Good Progress towards PT goals: Progressing toward goals    Frequency    Min 4X/week      PT Plan Current plan remains appropriate    Co-evaluation              AM-PAC PT "6 Clicks" Mobility   Outcome Measure  Help needed turning from your back to your side while in a flat bed without using bedrails?: A Lot Help needed moving from lying on your back to sitting on the side of a flat bed without using bedrails?: A Lot Help needed moving to and from a bed to a chair (including a wheelchair)?: A Little Help needed standing up from a chair using your arms (e.g., wheelchair or bedside chair)?: A Little Help needed to walk in hospital room?: A Little Help needed climbing 3-5 steps with a railing? : A Lot 6 Click Score: 15    End of Session Equipment Utilized During Treatment: Gait belt Activity Tolerance: Patient limited by pain Patient left: in chair;with call bell/phone within reach;with chair alarm set Nurse Communication: Mobility status PT Visit Diagnosis: Other abnormalities of gait and mobility (R26.89);Pain Pain - part of body:  (back)     Time: 6160-7371 PT  Time Calculation (min) (ACUTE ONLY): 23 min  Charges:  $Gait Training: 8-22 mins $Therapeutic Exercise: 8-22 mins                     Candie Mile, PT    Ellouise Newer 09/13/2021, 10:56 AM

## 2021-09-13 NOTE — Progress Notes (Addendum)
Progress Note  Patient: [name] UYQ:[id number] DOB: 12-Oct-1945  DOA: 09/09/2021  DOS: 09/13/2021    Brief hospital course: [name] is a 76 y.o. female with medical history significant of CAD s/p CABG in 2016, HFpEF,s/p CRT-pacemaker, chronic hypoxemic respiratory failure on 2.5 to 3 L, adrenal insufficiency, T2DM, HTN, CKD stage 4, OSA, asthma s/p fall.   Pt was walking with walker and fell when she turned landing on her buttocks and hit her forehead. She is on Plavix. She denies any lightheadedness or dizziness. No chest pain or shortness of breath.    She was found to have an unstable at T9-10 fracture and neurosurgery has deemed it an unstable fracture and will need OR tomorrow.  There is also concerns of nonunion or recurrent fracture at the site of the previous ORIF of the left femur. Family is concerned because of her heart and kidney disease. She was seen earlier this year for hip fracture and postoperatively had ATN briefly requiring dialysis. Access was later removed. However pt tells me that she had follow with nephrology last week and was told she now needs dialysis indefinitely.  She is awaiting callback on next steps.   She currently has AKI with creatinine of 4.75 up from 4.03.  She takes 160 mg of furosemide daily and still appears hypervolemic on exam with lower extremity edema although she reports this is no worse than usual.  She remained on her home 2.5-3 L O2.  Assessment and Plan: Unstable T9 vertebral fracture: s/p T7-T11 pedicle screw placement 8/1 Dr. Zada Finders. - Continue pain control.   - Postoperative PT evaluation > CIR recommended, consulted PM&R per protocol, insurance authorization sought.   Stage IV CKD: Pt reports she's told she'll be headed toward dialysis shortly, has no access currently. No acute indications for dialysis.  - Appreciate nephrology following closely. SCr stable, improved. - Renal diet  Chronic HFmrEF: Recent echo with LVEF  45-50%, apical hypokinesis, normal RV size and function, mildly elevated PASP.  - Appreciate nephrology, cardiology recommendations Re: diuretic dosing. Giving lasix '80mg'$  po qAM, '40mg'$  po qPM. - Continue metoprolol succinate. BP soft, hold hydralazine for today.  - Unable to start ACE/ARB/ARNI w/advanced CKD  Abdominal pain, constipation: Currently exam is reassuring, no obstipation, improving. CT inclusive of abd/pelvis on admission showed no significant abnormalities in that area. Lactic acid, lipase, CBC, CMP reassuring.  - Check XR r/o obstruction/post-op ileus.  - Add/augment bowel regimen considerably today.  - XR personally reviewed, d/w pt. Will give suppository and if no improvement, enema. - Remain OOB as much as possible.  CAD s/p CABG 5v Oct 2016, HLD: No recent angina or advancing CHF symptoms.  - Cardiology consulted for perioperative risk evaluation, their assistance is appreciated. No further changes to the plan are recommended.  - NSG ok with continuing DAPT which is recommended to continue lifelong per patient's cardiologist. Will plan to continue aspirin, plavix for now.  - Continue beta blocker, statin.   Chronic hypoxemic respiratory failure, COPD without exacerbation: Remains on baseline 2-3L O2.  - Maintain SpO2 >88% with 2-3L O2.   - Continue symbicort, prn bronchodilators, chronic prednisone continued.  OSA: Intolerant to CPAP  History of fracture of left hip: s/p ORIF in 03/2021 by Dr. Sammuel Hines. CT left hip suggests nonunion, for which patient is at very high risk with comorbidities, chronic steroid.  - Orthopedics evaluated the patient, recommending outpatient follow up in lieu of repeat procedure which would carry high morbidity risk.  Morbid obesity, OHS: Weight loss recommended.   Iatrogenic adrenal insufficiency: Due to chronic prednisone for COPD.  - Continue prednisone '10mg'$ . BP actually elevated, so not augmenting steroids at this time.   T2DM: HbA1c 6.3%.    HTN:  - Continue imdur/hydralazine  Right carotid stenosis: s/p TCAR.   CHB s/p PPM: Noted. With BiV ICD for severe cardiomyopathy 2008, then CRT-P 2014 with improved function.  - Device interrogation post-operatively per report: 0 episode of VT or SVT, 97% RV paced, 98% LV paced, <1% AT/AF, normal device function, good battery life   GERD:  - Continue PPI, carafate  Gout: Quiescent.  - Continue allopurinol  Subjective: Back pain well-controlled, abdominal discomfort continues, improved with flatus transiently, having a lot of flatus. No BM yet. Losing appetite.   Objective: Vitals:   09/13/21 0326 09/13/21 0750 09/13/21 0859 09/13/21 1157  BP: 118/63 (!) 136/50  (!) 93/52  Pulse: 76 76  74  Resp: '19 16  20  '$ Temp: 98.6 F (37 C) 98.2 F (36.8 C)  98.4 F (36.9 C)  TempSrc: Oral Oral  Oral  SpO2: 100% 100% 98% 100%  Weight:      Height:      Gen: 77 y.o. female in no distress Pulm: Nonlabored breathing supplemental oxygen, diminished without wheezes or crackles. CV: Regular, paced. Stable soft systolic murmur, no new murmur, rub, or gallop. No JVD, trace edema. GI: Abdomen soft, no point tenderness, non-distended, with normoactive bowel sounds. Ext: Warm, no deformities Skin: No rashes, lesions or ulcers on visualized skin. Spine incision site c/d/i Neuro: Alert and oriented. No focal neurological deficits. Psych: Judgement and insight appear fair. Mood euthymic & affect congruent. Behavior is appropriate.    Data Personally reviewed: CBC: Recent Labs  Lab 09/09/21 1438 09/09/21 1443 09/10/21 0235 09/11/21 0937  WBC 6.5  --  6.4 8.0  NEUTROABS  --   --   --  7.1  HGB 8.5* 9.2* 8.0* 8.1*  HCT 26.7* 27.0* 25.6* 27.1*  MCV 97.1  --  97.0 100.0  PLT 177  --  161 627*   Basic Metabolic Panel: Recent Labs  Lab 09/09/21 1438 09/09/21 1443 09/10/21 0235 09/11/21 0937 09/12/21 0601  NA 134* 132* 138 140 140  K 3.9 3.9 3.2* 3.9 3.8  CL 91* 92* 95* 100 97*   CO2 29  --  31 32 30  GLUCOSE 179* 176* 109* 173* 125*  BUN 118* >130* 109* 92* 94*  CREATININE 4.75* 4.90* 3.99* 3.18* 3.03*  CALCIUM 9.3  --  9.6 9.5 9.5   GFR: Estimated Creatinine Clearance: 16.5 mL/min (A) (by C-G formula based on SCr of 3.03 mg/dL (H)). Liver Function Tests: Recent Labs  Lab 09/09/21 1438 09/11/21 0937 09/12/21 0601  AST 20 55* 26  ALT 16 65* 42  ALKPHOS 93 84 74  BILITOT 0.6 0.6 0.7  PROT 6.5 6.4* 5.5*  ALBUMIN 3.6 3.7 3.3*   Recent Labs  Lab 09/11/21 0937  LIPASE 38   Urine analysis:    Component Value Date/Time   COLORURINE STRAW (A) 09/09/2021 [place] 09/09/2021 1644   LABSPEC 1.006 09/09/2021 1644   PHURINE 6.0 09/09/2021 [place] 09/09/2021 1644   HGBUR NEGATIVE 09/09/2021 1644   BILIRUBINUR NEGATIVE 09/09/2021 1644   KETONESUR NEGATIVE 09/09/2021 1644   PROTEINUR NEGATIVE 09/09/2021 1644   UROBILINOGEN 0.2 12/06/2014 1531   NITRITE NEGATIVE 09/09/2021 [place] 09/09/2021 1644  DG Abd Portable 1V  Result Date: 09/13/2021 CLINICAL DATA:  [id number], constipation postop pain EXAM: PORTABLE ABDOMEN - 1 VIEW COMPARISON:  August 27, 2021 FINDINGS: Study is suboptimal due to underpenetration and large body habitus. There has been interval thoracic spine surgery with rods and screw devices seen transfixing the thoracic spine. Sternotomy wires. Cardiac pacer wires, stable. Again seen is an intramedullary rod and compression screw device at the left femoral neck. Bowel-gas pattern is nonobstructive. Moderately large stool burden in the visualized cecum and ascending colon. Moderately severe lumbar spondylosis. IMPRESSION: Study is suboptimal due to underpenetration and large body habitus. There has been interval thoracic spine surgery with rods and screw devices in place. Moderately large stool burden in the visualized colon of likely constipation. Bowel-gas pattern is nonobstructive.  Electronically Signed   By: Frazier Richards M.D.   On: 09/13/2021 10:37   DG Thoracic Spine 2 View  Result Date: 09/11/2021 CLINICAL DATA:  Back pain EXAM: THORACIC SPINE 2 VIEWS COMPARISON:  Films from the previous day. FINDINGS: Pedicle screws are again seen extending from T7-T11 stable in appearance from the prior exam. Posterior fixation is now seen. No compression deformities are seen. Mild degenerative changes of the thoracolumbar spine are noted. IMPRESSION: Status post T7-T11 fusion. Electronically Signed   By: Inez Catalina M.D.   On: 09/11/2021 22:14     Family Communication: None at bedside  Disposition: Status is: Inpatient Remains inpatient appropriate because: Insurance authorization pending for CIR. Patient is medically stable for transfer there.   ADDENDUM: Notified of request for peer to peer by [facility], called and after holding was told they'd call back. Ultimately did get a call, spoke with Dr. Siri Cole who, after our discussion >10 minutes, concluded that inpatient rehabilitation will not be approved due to lack of a spinal cord injury diagnosis for this patient. She recommended pursuing SNF level of care. Notified CIR and TOC.  Patrecia Pour, MD 09/13/2021 1:04 PM Page by [email or web address]

## 2021-09-13 NOTE — Discharge Instructions (Signed)
Neurosurgery Discharge Instructions  No restriction in activities, slowly increase your activity back to normal.   Your incision is closed with dermabond (purple glue). This will naturally fall off over the next 1-2 weeks.   Okay to shower on the day of discharge. Use regular soap and water and try to be gentle when cleaning your incision.   Follow up with Dr. Zada Finders in 2 weeks after discharge. If you do not already have a discharge appointment, please call his office at [phone] to schedule a follow up appointment. If you have any concerns or questions, please call the office and let us know.

## 2021-09-13 NOTE — Progress Notes (Addendum)
Pt seen and evaluated this morning, back pain is improving, neurologically stable, pain improved from preop. No change in neurosurgical plan of care. Will see the patient on Monday if she's still inpatient, if there are any issues this weekend then please page the neurosurgery on call provider.   I've placed follow up / activity / wound care instructions in the discharge tab in Epic.

## 2021-09-14 DIAGNOSIS — I5032 Chronic diastolic (congestive) heart failure: Secondary | ICD-10-CM | POA: Diagnosis not present

## 2021-09-14 DIAGNOSIS — N179 Acute kidney failure, unspecified: Secondary | ICD-10-CM | POA: Diagnosis not present

## 2021-09-14 DIAGNOSIS — S22079A Unspecified fracture of T9-T10 vertebra, initial encounter for closed fracture: Secondary | ICD-10-CM | POA: Diagnosis not present

## 2021-09-14 DIAGNOSIS — Z950 Presence of cardiac pacemaker: Secondary | ICD-10-CM | POA: Diagnosis not present

## 2021-09-14 IMAGING — CT CT ANGIO NECK
3 of 7 series · 10 of 36 positions shown · IV contrast (APPLIED)
Comparison: Head CT 09/17/2020 and earlier. CT cervical spine
04/01/2020

CLINICAL DATA: 75-year-old female status post code stroke
presentation on 09/16/2020. No acute intracranial abnormality by CT.

EXAM:
CT ANGIOGRAPHY NECK
TECHNIQUE: Multidetector CT imaging of the neck was performed using the
standard protocol during bolus administration of intravenous
contrast. Multiplanar CT image reconstructions and MIPs were
obtained to evaluate the vascular anatomy. Carotid stenosis
measurements (when applicable) are obtained utilizing NASCET
criteria, using the distal internal carotid diameter as the
denominator.
CONTRAST:  75mL OMNIPAQUE IOHEXOL 350 MG/ML SOLN

[Series 6: cta neck · axial · 0.64mm/px · z∈[+1113,+1187]mm · 2 of 113 slices shown]
[im 38/113  soft-tissue]
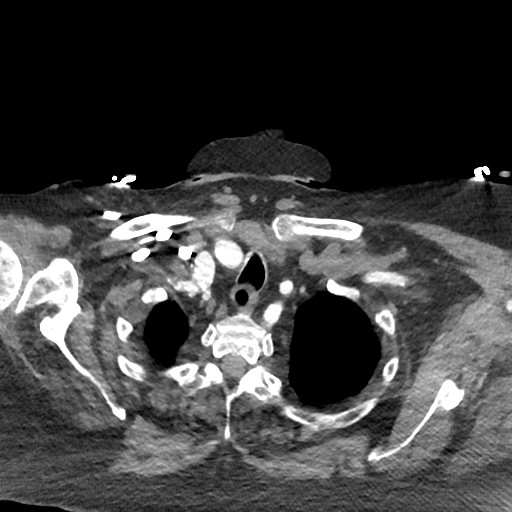
[im 75/113  soft-tissue]
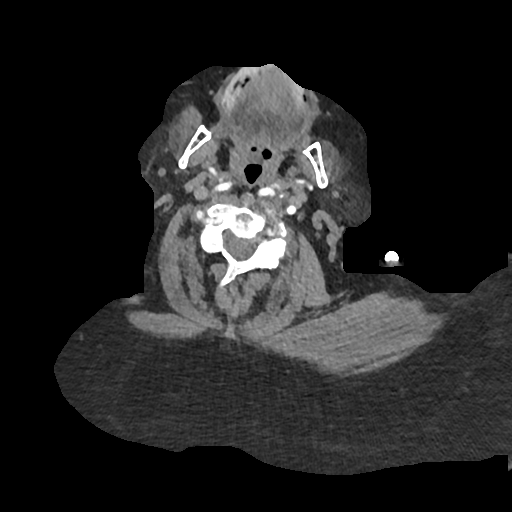

[Series 8: ax thins · axial · 0.39mm/px · z∈[+1066,+1228]mm · 6 of 228 slices shown]
[im 33/228  soft-tissue]
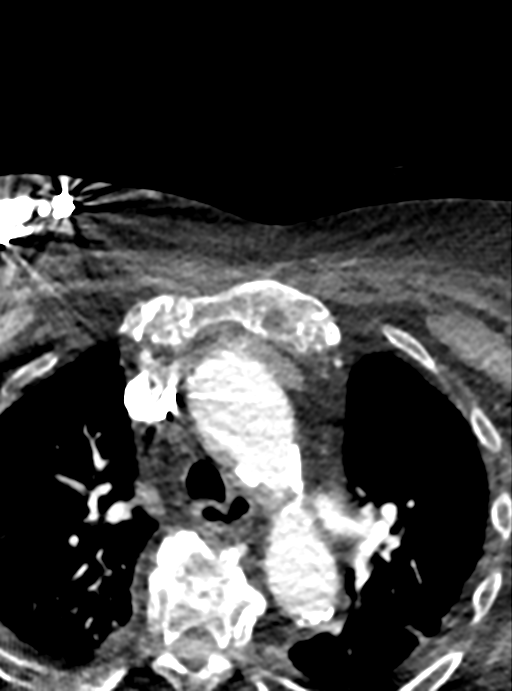
[im 65/228  bone]
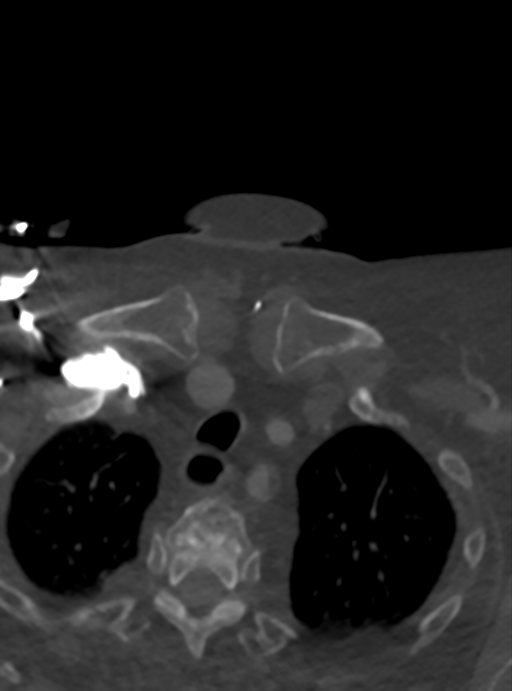
[im 98/228  soft-tissue]
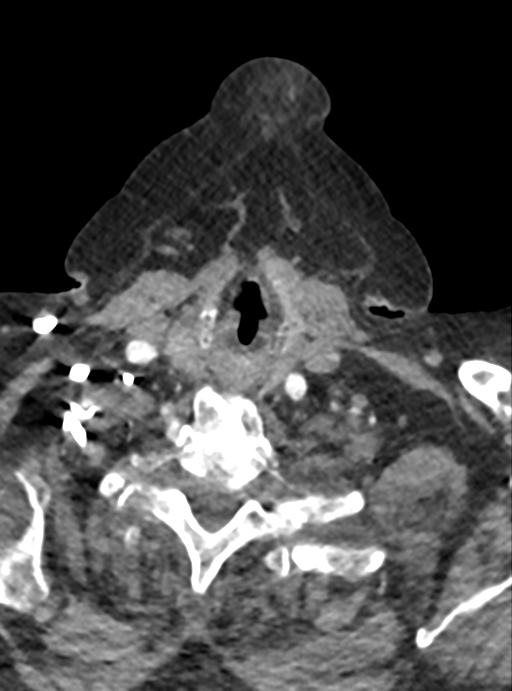
[im 130/228  bone]
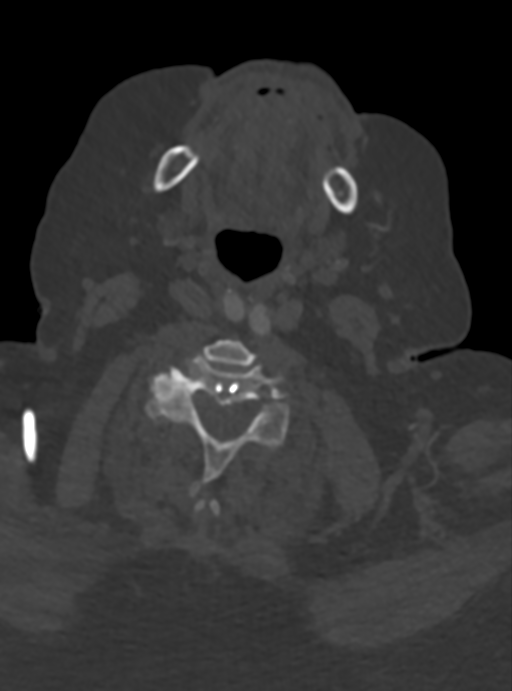
[im 163/228  soft-tissue]
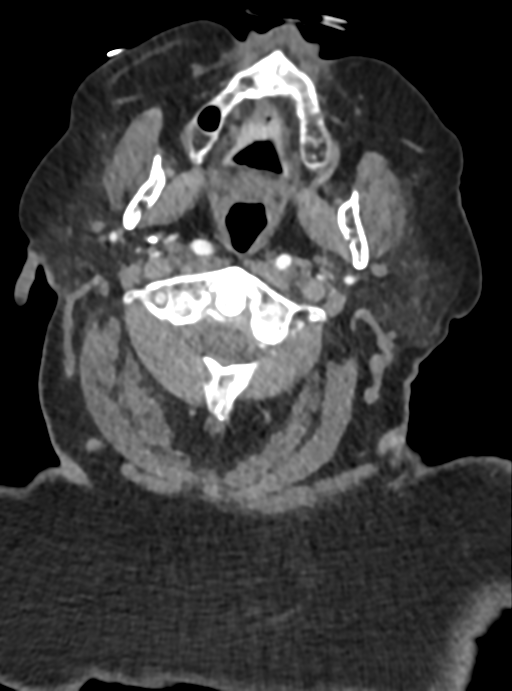
[im 195/228  bone]
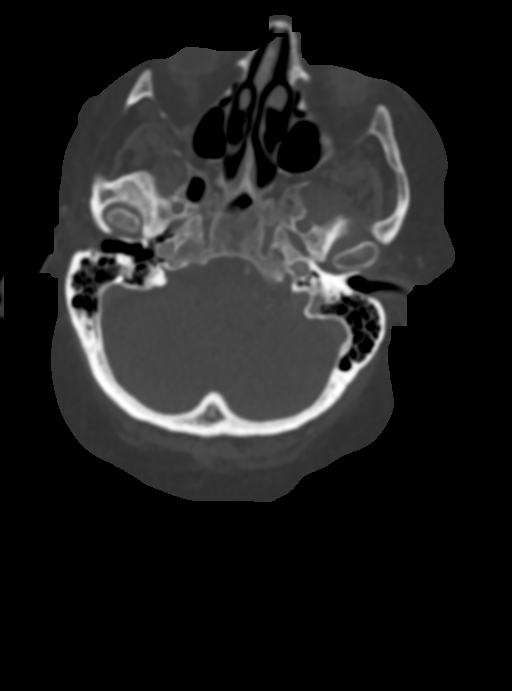

[Series 10: sag thins · sagittal · 0.49mm/px · 2 of 184 slices shown]
[im 32/184  soft-tissue]
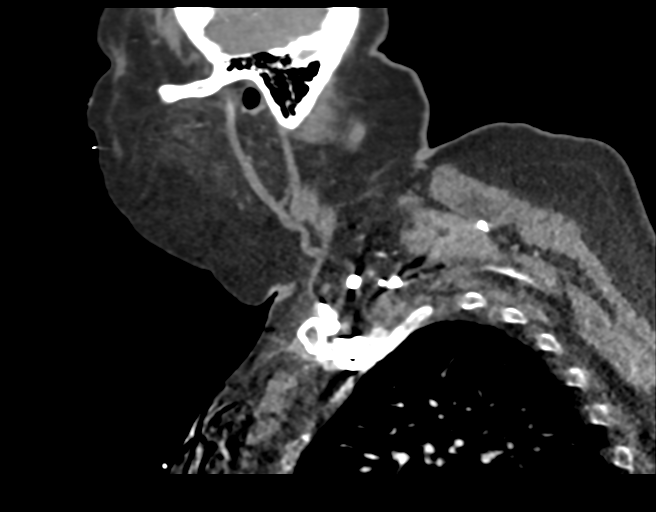
[im 153/184  soft-tissue]
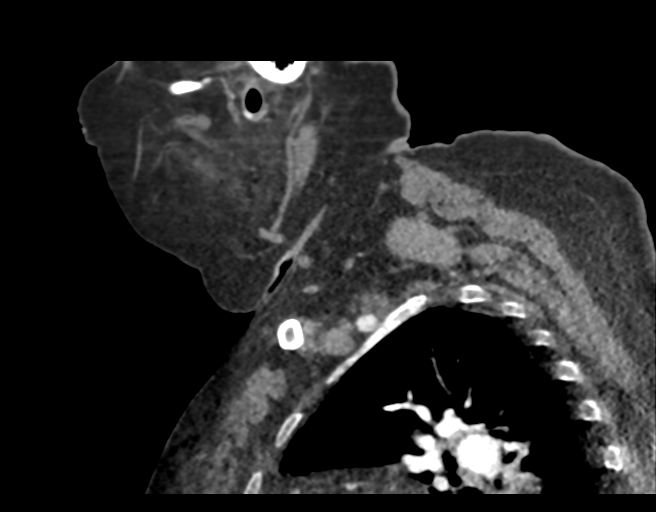

[10 of 36 positions shown; findings below may reference images not displayed]

FINDINGS: Skeleton: Prior C4-C5 ACDF with evidence of solid arthrodesis there.
Advanced chronic cervical spine degeneration elsewhere with possible
developing ankylosis at both C5-C6 and C6-C7. Prior sternotomy.
Advanced upper thoracic disc and endplate degeneration. No acute
osseous abnormality identified.

Upper chest: Right chest cardiac pacemaker. Sequelae of CABG. No
superior mediastinal lymphadenopathy. Mild upper lung atelectasis.

Other neck: Negative.

Negative visible brain parenchyma, orbits.

Aortic arch: Calcified aortic atherosclerosis. Prior CABG. Three
vessel arch configuration.

Right carotid system: Calcified plaque at the brachiocephalic artery
and right CCA origins without stenosis. Retropharyngeal course of
the right CCA with intermittent calcified plaque proximal to the
right carotid bifurcation. The bifurcation is also retropharyngeal,
and there is bulky soft and calcified plaque at the right carotid
bifurcation resulting in stenosis of both the right ECA and right
ICA origins (series 9, image 103). Right ICA origin stenosis is 50 %
with respect to the distal vessel. Tortuous right ICA distal to the
bulb remains patent to the skull base.

Left carotid system: Calcified plaque at the left CCA origin without
stenosis. Partially retropharyngeal course of the left carotid
including the left carotid bifurcation. Calcified plaque at the
bifurcation results in less than 50 % stenosis with respect to the
distal vessel of the left ICA which remains patent to the skull
base. Mild calcified plaque below the skull base.

Vertebral arteries:
Soft and calcified plaque of the right subclavian artery origin
results an 60% stenosis with respect to the distal vessel. Tortuous
proximal right subclavian. Soft and calcified plaque at the right
vertebral artery origin results in moderate origin stenosis. The
right vertebral artery is otherwise patent to the skull base.

Soft and calcified plaque of the proximal left subclavian artery
results in less than 50% stenosis. Left vertebral artery origin is
normal. But the left vertebral is non dominant and diminutive
throughout the neck and to the skull base.

Limited intracranial: Calcified plaque of the dominant right
vertebral artery as it crosses the dura results in moderate stenosis
on series 8, image 56. Additional right V4 calcified plaque with
only mild stenosis. The right vertebral supplies the basilar which
is diminutive but remains patent to the SCA origins. There are fetal
type bilateral PCA origins. The non dominant left vertebral
terminates in PICA.

Both ICA siphons are patent but heavily calcified. Bilateral carotid
termini are patent. There is a 5 mm aneurysm of the distal left ICA
at the posterior communicating artery origin, directed posteriorly
and inferiorly (series 8, image 9). The left PCA is fetal.

Review of the MIP images confirms the above findings
IMPRESSION: 1. Intracranial aneurysm (5 mm) of the distal Left ICA inseparable
from the left Pcomm which constitutes a fetal origin of the left
PCA. Neuro-Endovascular consultation is suggested to evaluate the
appropriateness of potential treatment.

2. Bulky soft and calcified plaque at the retropharyngeal right
carotid bifurcation resulting in stenosis of both the Right ICA
(50%) and ECA origins.
Left carotid atherosclerosisWith less than 50% stenosis of the
cervical left ICA.
Both ICA siphons are patent but heavily calcified.

3. Right Subclavian Artery origin 60% stenosis. And superimposed
Moderate stenoses of the dominant Right Vertebral Artery (which
supplies the Basilar Artery) at both its origin and V4 segments.

4. Prior CABG. Aortic Atherosclerosis (IHRE9-E1C.C).

5. Prior C4-C5 ACDF with solid arthrodesis and advanced cervical
spine degeneration elsewhere with suspected developing ankylosis at
C5-C6 and C6-C7.

## 2021-09-14 MED ORDER — BISACODYL 10 MG RE SUPP
10.0000 mg | Freq: Once | RECTAL | Status: AC
  Administered 2021-09-14: 10 mg via RECTAL
  Filled 2021-09-14: qty 1

## 2021-09-14 NOTE — Progress Notes (Signed)
Inpatient Rehab Admissions Coordinator:    Request for CIR admit denied by Pt.'s insurance. Pt. Notified and she wishes to appeal decision. Expedited appeal filed at 10:00 [place], [place], [place] Admissions Coordinator  [phone] (celll) [phone] (office)

## 2021-09-14 NOTE — TOC Progression Note (Signed)
Transition of Care [facility]) - Progression Note    Patient Details  Name: [name] MRN: [id number] Date of Birth: 1945/07/12  Transition of [facility]) CM/SW Contact  Joanne Chars, LCSW Phone Number: 09/14/2021, 9:03 AM  Clinical Narrative:   CSW spoke with pt regarding CIR denial and new DC plan.  Pt reports that she is appealing the CIR denial, but if this appeal is not successful she wants to return home rather than pursue SNF.  Pt has [place] services currently in place, lives with her son, and has other family support in the home as well.       Expected Discharge Plan: [place] Barriers to Discharge: Continued Medical Work up  Expected Discharge Plan and Services Expected Discharge Plan: [place]   Discharge Planning Services: CM Consult   Living arrangements for the past 2 months: Mobile Home Expected Discharge Date: 09/13/21                                     Social Determinants of Health (SDOH) Interventions    Readmission Risk Interventions    01/10/2021    9:47 AM  Readmission Risk Prevention Plan  Transportation Screening Complete  PCP or Specialist Appt within 3-5 Days Complete  HRI or [place] Complete  Social Work Consult for [place] Planning/Counseling Complete  Palliative Care Screening Not Applicable  Medication Review [employer]) Complete

## 2021-09-14 NOTE — Progress Notes (Signed)
Progress Note  Patient: [name] PFX:[id number] DOB: 1945-08-14  DOA: 09/09/2021  DOS: 09/14/2021    Brief hospital course: [name] is a 76 y.o. female with medical history significant of CAD s/p CABG in 2016, HFpEF,s/p CRT-pacemaker, chronic hypoxemic respiratory failure on 2.5 to 3 L, adrenal insufficiency, T2DM, HTN, CKD stage 4, OSA, asthma s/p fall.   Pt was walking with walker and fell when she turned landing on her buttocks and hit her forehead. She is on Plavix. She denies any lightheadedness or dizziness. No chest pain or shortness of breath.    She was found to have an unstable at T9-10 fracture and neurosurgery has deemed it an unstable fracture and will need OR tomorrow.  There is also concerns of nonunion or recurrent fracture at the site of the previous ORIF of the left femur. Family is concerned because of her heart and kidney disease. She was seen earlier this year for hip fracture and postoperatively had ATN briefly requiring dialysis. Access was later removed. However pt tells me that she had follow with nephrology last week and was told she now needs dialysis indefinitely.  She is awaiting callback on next steps.   She currently has AKI with creatinine of 4.75 up from 4.03.  She takes 160 mg of furosemide daily and still appears hypervolemic on exam with lower extremity edema although she reports this is no worse than usual.  She remained on her home 2.5-3 L O2.  Assessment and Plan: Unstable T9 vertebral fracture: s/p T7-T11 pedicle screw placement 8/1 Dr. Zada Finders. No noted SCI, though patient was admitted for a fall at home and now has limited mobility. Would benefit from, and tolerate, intensive therapy 3 hours per day. Insurance denied CIR after peer-to-peer 8/4. Expedited appeal requested.  - Continue pain control.   - Will follow up with neurosurgery as outpatient. Follow-up/activity/wound care instructions placed per Dr. Zada Finders.  Stage IV CKD: Pt  reports she's told she'll be headed toward dialysis shortly, has no access currently. No acute indications for dialysis.  - Appreciate nephrology following closely. SCr stable, improved. - Renal diet  Chronic HFmrEF: Recent echo with LVEF 45-50%, apical hypokinesis, normal RV size and function, mildly elevated PASP.  - Appreciate nephrology, cardiology recommendations Re: diuretic dosing. Giving lasix '80mg'$  po qAM, '40mg'$  po qPM. - Continue metoprolol succinate. BP soft, hold hydralazine. Noting no evidence of sepsis, bleeding, dehydration. Recheck parameters in AM if remains inpatient. - Unable to start ACE/ARB/ARNI w/advanced CKD  Abdominal pain, constipation: Currently exam is reassuring, no obstipation, improving. CT inclusive of abd/pelvis on admission showed no significant abnormalities in that area. Lactic acid, lipase, CBC, CMP reassuring. XR 8/4 without obstruction, had small BM with suppository and bowel regimen.  - Repeat suppository today. Continue more aggressive bowel regimen for now. Abd benign.  - Remain OOB as much as possible.  CAD s/p CABG 5v Oct 2016, HLD: No recent angina or advancing CHF symptoms.  - Cardiology consulted for perioperative risk evaluation, their assistance is appreciated. No further changes to the plan are recommended.  - NSG ok with continuing DAPT which is recommended to continue lifelong per patient's cardiologist. Continue aspirin, plavix - Continue beta blocker, statin.   Chronic hypoxemic respiratory failure, COPD without exacerbation: Remains on baseline 2-3L O2.  - Maintaining SpO2 >88% with 2-3L O2.   - Continue symbicort, prn bronchodilators, chronic prednisone continued.  OSA: Intolerant to CPAP  History of fracture of left hip: s/p ORIF in 03/2021 by  Dr. Sammuel Hines. CT left hip suggests nonunion, for which patient is at very high risk with comorbidities, chronic steroid.  - Orthopedics evaluated the patient, recommending outpatient follow up in lieu  of repeat procedure which would carry high morbidity risk. - Continue WBAT LLE  Morbid obesity, OHS: Weight loss recommended.   Iatrogenic adrenal insufficiency: Due to chronic prednisone for COPD.  - Continue prednisone '10mg'$ .    T2DM: HbA1c 6.3%.   HTN:  - Continue imdur/hydralazine  Right carotid stenosis: s/p TCAR.   CHB s/p PPM: Noted. With BiV ICD for severe cardiomyopathy 2008, then CRT-P 2014 with improved function.  - Device interrogation post-operatively per report: 0 episode of VT or SVT, 97% RV paced, 98% LV paced, <1% AT/AF, normal device function, good battery life   GERD:  - Continue PPI, carafate  Gout: Quiescent.  - Continue allopurinol  Subjective: Feeling in general better, pain is better controlled, had a small BM. Still no appetite but no vomiting when she eats which she knows she needs to do.  Objective: Vitals:   09/14/21 0325 09/14/21 0755 09/14/21 0830 09/14/21 1130  BP: (!) 112/54  115/63 (!) 123/58  Pulse: 65 66 78 74  Resp: '18 18 20 17  '$ Temp: 98.5 F (36.9 C)  98.4 F (36.9 C) 98.5 F (36.9 C)  TempSrc: Axillary  Oral Oral  SpO2: 99% 98% 99% 98%  Weight:      Height:      Gen: 76 y.o. female in no distress Pulm: Nonlabored breathing supplemental oxygen. Clear but diminished without wheezes. CV: Regular, paced, stable soft early systolic apical murmur without other murmur, rub, or gallop. No JVD, trace dependent edema. GI: Abdomen soft, less protuberant, no point tenderness, rebound or guarding. +BS. Ext: Warm, no deformities Skin: No rashes, lesions or ulcers on visualized skin. Stable ecchymoses, surgical site without erythema, exudate. Neuro: Alert and oriented. No focal neurological deficits. Psych: Judgement and insight appear fair. Mood euthymic & affect congruent. Behavior is appropriate.    Data Personally reviewed: CBC: Recent Labs  Lab 09/09/21 1438 09/09/21 1443 09/10/21 0235 09/11/21 0937  WBC 6.5  --  6.4 8.0  NEUTROABS   --   --   --  7.1  HGB 8.5* 9.2* 8.0* 8.1*  HCT 26.7* 27.0* 25.6* 27.1*  MCV 97.1  --  97.0 100.0  PLT 177  --  161 202*   Basic Metabolic Panel: Recent Labs  Lab 09/09/21 1438 09/09/21 1443 09/10/21 0235 09/11/21 0937 09/12/21 0601  NA 134* 132* 138 140 140  K 3.9 3.9 3.2* 3.9 3.8  CL 91* 92* 95* 100 97*  CO2 29  --  31 32 30  GLUCOSE 179* 176* 109* 173* 125*  BUN 118* >130* 109* 92* 94*  CREATININE 4.75* 4.90* 3.99* 3.18* 3.03*  CALCIUM 9.3  --  9.6 9.5 9.5   GFR: Estimated Creatinine Clearance: 16.5 mL/min (A) (by C-G formula based on SCr of 3.03 mg/dL (H)). Liver Function Tests: Recent Labs  Lab 09/09/21 1438 09/11/21 0937 09/12/21 0601  AST 20 55* 26  ALT 16 65* 42  ALKPHOS 93 84 74  BILITOT 0.6 0.6 0.7  PROT 6.5 6.4* 5.5*  ALBUMIN 3.6 3.7 3.3*   Recent Labs  Lab 09/11/21 0937  LIPASE 38   Urine analysis:    Component Value Date/Time   COLORURINE STRAW (A) 09/09/2021 1644   APPEARANCEUR CLEAR 09/09/2021 1644   LABSPEC 1.006 09/09/2021 1644   PHURINE 6.0 09/09/2021 1644   GLUCOSEU  NEGATIVE 09/09/2021 1644   HGBUR NEGATIVE 09/09/2021 [place] 09/09/2021 [place] 09/09/2021 1644   PROTEINUR NEGATIVE 09/09/2021 1644   UROBILINOGEN 0.2 12/06/2014 1531   NITRITE NEGATIVE 09/09/2021 [place] 09/09/2021 1644      DG Abd Portable 1V  Result Date: 09/13/2021 CLINICAL DATA:  [id number], constipation postop pain EXAM: PORTABLE ABDOMEN - 1 VIEW COMPARISON:  August 27, 2021 FINDINGS: Study is suboptimal due to underpenetration and large body habitus. There has been interval thoracic spine surgery with rods and screw devices seen transfixing the thoracic spine. Sternotomy wires. Cardiac pacer wires, stable. Again seen is an intramedullary rod and compression screw device at the left femoral neck. Bowel-gas pattern is nonobstructive. Moderately large stool burden in the visualized cecum and ascending colon.  Moderately severe lumbar spondylosis. IMPRESSION: Study is suboptimal due to underpenetration and large body habitus. There has been interval thoracic spine surgery with rods and screw devices in place. Moderately large stool burden in the visualized colon of likely constipation. Bowel-gas pattern is nonobstructive. Electronically Signed   By: Frazier Richards M.D.   On: 09/13/2021 10:37     Family Communication: None at bedside  Disposition: Status is: Inpatient Remains inpatient appropriate because: Awaiting expedited insurance appeal for CIR. Would recommend pursuing SNF at discharge if denied again.    Patrecia Pour, MD 09/14/2021 12:14 PM Page by [email or web address]

## 2021-09-15 DIAGNOSIS — N179 Acute kidney failure, unspecified: Secondary | ICD-10-CM | POA: Diagnosis not present

## 2021-09-15 DIAGNOSIS — S22079A Unspecified fracture of T9-T10 vertebra, initial encounter for closed fracture: Secondary | ICD-10-CM | POA: Diagnosis not present

## 2021-09-15 DIAGNOSIS — I5032 Chronic diastolic (congestive) heart failure: Secondary | ICD-10-CM | POA: Diagnosis not present

## 2021-09-15 DIAGNOSIS — Z950 Presence of cardiac pacemaker: Secondary | ICD-10-CM | POA: Diagnosis not present

## 2021-09-15 LAB — CBC
HCT: 22.2 % — ABNORMAL LOW (ref 36.0–46.0)
Hemoglobin: 7 g/dL — ABNORMAL LOW (ref 12.0–15.0)
MCH: 30.7 pg (ref 26.0–34.0)
MCHC: 31.5 g/dL (ref 30.0–36.0)
MCV: 97.4 fL (ref 80.0–100.0)
Platelets: 130 10*3/uL — ABNORMAL LOW (ref 150–400)
RBC: 2.28 MIL/uL — ABNORMAL LOW (ref 3.87–5.11)
RDW: 12.7 % (ref 11.5–15.5)
WBC: 8 10*3/uL (ref 4.0–10.5)
nRBC: 0 % (ref 0.0–0.2)

## 2021-09-15 LAB — BASIC METABOLIC PANEL
Anion gap: 9 (ref 5–15)
BUN: 69 mg/dL — ABNORMAL HIGH (ref 8–23)
CO2: 35 mmol/L — ABNORMAL HIGH (ref 22–32)
Calcium: 9 mg/dL (ref 8.9–10.3)
Chloride: 95 mmol/L — ABNORMAL LOW (ref 98–111)
Creatinine, Ser: 2.57 mg/dL — ABNORMAL HIGH (ref 0.44–1.00)
GFR, Estimated: 19 mL/min — ABNORMAL LOW (ref >60)
Glucose, Bld: 119 mg/dL — ABNORMAL HIGH (ref 70–99)
Potassium: 3 mmol/L — ABNORMAL LOW (ref 3.5–5.1)
Sodium: 139 mmol/L (ref 135–145)

## 2021-09-15 LAB — PREPARE RBC (CROSSMATCH)

## 2021-09-15 LAB — HEMOGLOBIN AND HEMATOCRIT, BLOOD
HCT: 26 % — ABNORMAL LOW (ref 36.0–46.0)
Hemoglobin: 8.2 g/dL — ABNORMAL LOW (ref 12.0–15.0)

## 2021-09-15 MED ORDER — ACETAMINOPHEN 500 MG PO TABS
1000.0000 mg | ORAL_TABLET | Freq: Four times a day (QID) | ORAL | Status: DC | PRN
  Administered 2021-09-15 – 2021-09-16 (×3): 1000 mg via ORAL
  Filled 2021-09-15 (×3): qty 2

## 2021-09-15 MED ORDER — SODIUM CHLORIDE 0.9% IV SOLUTION: Freq: Once | INTRAVENOUS | Status: DC

## 2021-09-15 MED ORDER — SALINE SPRAY 0.65 % NA SOLN: 1.0000 | NASAL | Status: DC | PRN

## 2021-09-15 MED ORDER — POTASSIUM CHLORIDE CRYS ER 20 MEQ PO TBCR
40.0000 meq | EXTENDED_RELEASE_TABLET | Freq: Every day | ORAL | Status: DC
  Administered 2021-09-15 – 2021-09-17 (×3): 40 meq via ORAL
  Filled 2021-09-15 (×3): qty 2

## 2021-09-15 MED ORDER — ISOSORBIDE MONONITRATE ER 30 MG PO TB24
30.0000 mg | ORAL_TABLET | Freq: Every day | ORAL | Status: DC
  Administered 2021-09-16 – 2021-09-17 (×2): 30 mg via ORAL
  Filled 2021-09-15 (×2): qty 1

## 2021-09-15 MED ORDER — SENNOSIDES-DOCUSATE SODIUM 8.6-50 MG PO TABS
1.0000 | ORAL_TABLET | Freq: Every day | ORAL | Status: DC
  Administered 2021-09-16 – 2021-09-17 (×2): 1 via ORAL
  Filled 2021-09-15 (×2): qty 1

## 2021-09-15 MED ORDER — OXYCODONE HCL 5 MG PO TABS
5.0000 mg | ORAL_TABLET | ORAL | Status: DC | PRN
  Administered 2021-09-15: 10 mg via ORAL
  Administered 2021-09-15: 5 mg via ORAL
  Administered 2021-09-16 – 2021-09-17 (×7): 10 mg via ORAL
  Filled 2021-09-15 (×8): qty 2

## 2021-09-15 NOTE — Progress Notes (Addendum)
Progress Note  Patient: VANILLA HEATHERINGTON BWG:[id number] DOB: 02/24/1945  DOA: 09/09/2021  DOS: 09/15/2021    Brief hospital course: [name] is a 76 y.o. female with medical history significant of CAD s/p CABG in 2016, HFpEF,s/p CRT-pacemaker, chronic hypoxemic respiratory failure on 2.5 to 3 L, adrenal insufficiency, T2DM, HTN, CKD stage 4, OSA, asthma s/p fall.   Pt was walking with walker and fell when she turned landing on her buttocks and hit her forehead. She is on Plavix. She denies any lightheadedness or dizziness. No chest pain or shortness of breath.    She was found to have an unstable at T9-10 fracture and neurosurgery has deemed it an unstable fracture and will need OR tomorrow.  There is also concerns of nonunion or recurrent fracture at the site of the previous ORIF of the left femur. Family is concerned because of her heart and kidney disease. She was seen earlier this year for hip fracture and postoperatively had ATN briefly requiring dialysis. Access was later removed. However pt tells me that she had follow with nephrology last week and was told she now needs dialysis indefinitely.  She is awaiting callback on next steps.   She currently has AKI with creatinine of 4.75 up from 4.03.  She takes 160 mg of furosemide daily and still appears hypervolemic on exam with lower extremity edema although she reports this is no worse than usual.  She remained on her home 2.5-3 L O2.  Assessment and Plan: Unstable T9 vertebral fracture: s/p T7-T11 pedicle screw placement 8/1 Dr. Zada Finders.  - No noted SCI, though patient was admitted for a fall at home and now has limited mobility. Would benefit from, and tolerate, intensive therapy 3 hours per day. Insurance denied CIR after peer-to-peer 8/4. Expedited appeal requested.  - Continue pain control.   - Will follow up with neurosurgery as outpatient. Follow-up/activity/wound care instructions placed per Dr. Zada Finders.  Stage IV CKD: Pt  reports she's told she'll be headed toward dialysis shortly, has no access currently. No acute indications for dialysis.  - Appreciate nephrology following closely. SCr stable, improved. - Renal diet  Chronic HFmrEF: Recent echo with LVEF 45-50%, apical hypokinesis, normal RV size and function, mildly elevated PASP.  - Appreciate nephrology, cardiology recommendations Re: diuretic dosing. Giving lasix '80mg'$  po qAM, '40mg'$  po qPM. - Continue metoprolol succinate. BP soft, hold hydralazine. Noting no evidence of sepsis, bleeding, dehydration. Recheck parameters in AM if remains inpatient. - Unable to start ACE/ARB/ARNI w/advanced CKD  Abdominal pain, constipation: Currently exam is reassuring, no obstipation, improving. CT inclusive of abd/pelvis on admission showed no significant abnormalities in that area. Lactic acid, lipase, CBC, CMP reassuring. XR 8/4 without obstruction, has now been having BMs.  - Continue regular bowel regimen. - Remain OOB as much as possible.  CAD s/p CABG 5v Oct 2016, HLD: No recent angina or advancing CHF symptoms.  - Cardiology consulted for perioperative risk evaluation, their assistance is appreciated. No further changes to the plan are recommended.  - NSG ok with continuing DAPT which is recommended to continue lifelong per patient's cardiologist. Continue aspirin, plavix - Continue beta blocker, statin.   Chronic hypoxemic respiratory failure, COPD without exacerbation: Remains on baseline 2-3L O2.  - Maintaining SpO2 >88% with 2-3L O2.   - Continue symbicort, prn bronchodilators, chronic prednisone continued.  OSA: Intolerant to CPAP  History of fracture of left hip: s/p ORIF in 03/2021 by Dr. Sammuel Hines. CT left hip suggests nonunion, for which patient  is at very high risk with comorbidities, chronic steroid.  - Orthopedics evaluated the patient, recommending outpatient follow up in lieu of repeat procedure which would carry high morbidity risk. - Continue WBAT  LLE  Morbid obesity, OHS: Weight loss recommended.   Acute postoperative blood loss anemia on anemia of CKD:  - 1u PRBCs for symptomatic anemia  Iatrogenic adrenal insufficiency: Due to chronic prednisone for COPD.  - Continue prednisone '10mg'$ .    T2DM: HbA1c 6.3%.   HTN:  - Continue imdur/hydralazine  Right carotid stenosis: s/p TCAR.   CHB s/p PPM: Noted. With BiV ICD for severe cardiomyopathy 2008, then CRT-P 2014 with improved function.  - Device interrogation post-operatively per report: 0 episode of VT or SVT, 97% RV paced, 98% LV paced, <1% AT/AF, normal device function, good battery life   GERD:  - Continue PPI, carafate  Gout: Quiescent.  - Continue allopurinol  Subjective: Had 3 BMs overnight, abdominal pain resolved. Now still with back pain managed with medications. Getting up OOB daily.   Objective: Vitals:   09/15/21 0330 09/15/21 0727 09/15/21 0911 09/15/21 1058  BP: (!) 118/56  (!) 117/55 (!) 112/51  Pulse: 66 66 80   Resp: '15 16 17 16  '$ Temp: 98.4 F (36.9 C)  98.2 F (36.8 C) 98.1 F (36.7 C)  TempSrc: Oral  Oral Oral  SpO2: 100% 99% 100% 100%  Weight:      Height:      Gen: 76 y.o. female in no distress Pulm: Nonlabored breathing supplemental oxygen. Clear. CV: Regular rate and rhythm. No murmur, rub, or gallop. No JVD, no significant dependent edema. GI: Abdomen soft, non-tender, non-distended, with normoactive bowel sounds.  Ext: Warm, no deformities Skin: No rashes, lesions or ulcers on visualized skin. Neuro: Alert and oriented. No focal neurological deficits. Psych: Judgement and insight appear fair. Mood euthymic & affect congruent. Behavior is appropriate.    Data Personally reviewed: CBC: Recent Labs  Lab 09/09/21 1438 09/09/21 1443 09/10/21 0235 09/11/21 0937 09/15/21 0458  WBC 6.5  --  6.4 8.0 8.0  NEUTROABS  --   --   --  7.1  --   HGB 8.5* 9.2* 8.0* 8.1* 7.0*  HCT 26.7* 27.0* 25.6* 27.1* 22.2*  MCV 97.1  --  97.0 100.0 97.4   PLT 177  --  161 149* 735*   Basic Metabolic Panel: Recent Labs  Lab 09/09/21 1438 09/09/21 1443 09/10/21 0235 09/11/21 0937 09/12/21 0601 09/15/21 0458  NA 134* 132* 138 140 140 139  K 3.9 3.9 3.2* 3.9 3.8 3.0*  CL 91* 92* 95* 100 97* 95*  CO2 29  --  31 32 30 35*  GLUCOSE 179* 176* 109* 173* 125* 119*  BUN 118* >130* 109* 92* 94* 69*  CREATININE 4.75* 4.90* 3.99* 3.18* 3.03* 2.57*  CALCIUM 9.3  --  9.6 9.5 9.5 9.0   GFR: Estimated Creatinine Clearance: 19.5 mL/min (A) (by C-G formula based on SCr of 2.57 mg/dL (H)). Liver Function Tests: Recent Labs  Lab 09/09/21 1438 09/11/21 0937 09/12/21 0601  AST 20 55* 26  ALT 16 65* 42  ALKPHOS 93 84 74  BILITOT 0.6 0.6 0.7  PROT 6.5 6.4* 5.5*  ALBUMIN 3.6 3.7 3.3*   Recent Labs  Lab 09/11/21 0937  LIPASE 38   Urine analysis:    Component Value Date/Time   COLORURINE STRAW (A) 09/09/2021 [place] 09/09/2021 1644   LABSPEC 1.006 09/09/2021 1644   PHURINE 6.0 09/09/2021 1644  GLUCOSEU NEGATIVE 09/09/2021 1644   HGBUR NEGATIVE 09/09/2021 [place] 09/09/2021 [place] 09/09/2021 1644   PROTEINUR NEGATIVE 09/09/2021 1644   UROBILINOGEN 0.2 12/06/2014 1531   NITRITE NEGATIVE 09/09/2021 1644   LEUKOCYTESUR NEGATIVE 09/09/2021 1644      No results found.   Family Communication: None at bedside  Disposition: Status is: Inpatient Remains inpatient appropriate because: Awaiting expedited insurance appeal for CIR. Would recommend pursuing SNF at discharge if denied again.    Patrecia Pour, MD 09/15/2021 12:05 PM Page by [email or web address]

## 2021-09-15 NOTE — Progress Notes (Addendum)
Physical Therapy Treatment Patient Details Name: [name] MRN: [id number] DOB: December 23, 1945 Today's Date: 09/15/2021   History of Present Illness The pt is a 76 y/o female admitted 7/31 s/p fall sustaining unstable T9-10 fracture s/p T7-T11 posterior pedicle screw fixation.  PMHx: CAD s/p CABG, AICD, afib, CHF, HTN, IBA, OSA, stroke, DM2, , multiple Cardiac Caths, L IM nailing L hip, and angiograms.    PT Comments    The pt was agreeable to limited session due to pain despite attempts at premedication. The pt was limited to ~6 ft ambulation in the room and 1 min static standing prior to needing seated rest. The pt then reports increased dizziness and fatigue, required continued seated rest to complete activities at sink. The pt remains highly motivated and will benefit from maximal skilled PT to progress functional strength, endurance, and dynamic stability prior to eventual d/c home. Continue to recommend acute inpatient rehab.    Recommendations for follow up therapy are one component of a multi-disciplinary discharge planning process, led by the attending physician.  Recommendations may be updated based on patient status, additional functional criteria and insurance authorization.  Follow Up Recommendations  Acute inpatient rehab (3hours/day)     Assistance Recommended at Discharge Intermittent Supervision/Assistance  Patient can return home with the following A little help with walking and/or transfers;A little help with bathing/dressing/bathroom;Assistance with cooking/housework;Assist for transportation;Help with stairs or ramp for entrance   Equipment Recommendations  None recommended by PT    Recommendations for Other Services       Precautions / Restrictions Precautions Precautions: Fall Restrictions Weight Bearing Restrictions: No     Mobility  Bed Mobility Overal bed mobility: Needs Assistance Bed Mobility: Rolling, Sidelying to Sit Rolling: Min assist Sidelying  to sit: HOB elevated, Min assist       General bed mobility comments: minA to complete movements. increased time and effort with pt using bed rails    Transfers Overall transfer level: Needs assistance Equipment used: Rolling walker (2 wheels) Transfers: Sit to/from Stand Sit to Stand: Min assist, Min guard           General transfer comment: minA initially, cues for hand placement. pt able to stand with minG on repeated attempts    Ambulation/Gait Ambulation/Gait assistance: Min assist Gait Distance (Feet): 6 Feet Assistive device: Rolling walker (2 wheels) Gait Pattern/deviations: Decreased stride length, Shuffle, Wide base of support, Step-to pattern Gait velocity: slow     General Gait Details: cues for increased stride length and positioning in RW, limited by pain and fatigue        Balance Overall balance assessment: Needs assistance Sitting-balance support: No upper extremity supported, Single extremity supported, Feet supported Sitting balance-Leahy Scale: Fair Sitting balance - Comments: limited dynamically'   Standing balance support: During functional activity, Bilateral upper extremity supported Standing balance-Leahy Scale: Poor Standing balance comment: reliant on external support                            Cognition Arousal/Alertness: Awake/alert Behavior During Therapy: WFL for tasks assessed/performed Overall Cognitive Status: Within Functional Limits for tasks assessed                                          Exercises General Exercises - Lower Extremity Ankle Circles/Pumps: AROM, Both, 20 reps, Seated Long Arc Quad: AROM, Both,  10 reps, Seated    General Comments General comments (skin integrity, edema, etc.): VSS on 3L O2, MD arrived in session to discuss plan for 1 unit PRBC transfusion later today      Pertinent Vitals/Pain Pain Assessment Pain Assessment: Faces Faces Pain Scale: Hurts even more Pain  Location: sx thoracic incision, headache after session Pain Descriptors / Indicators: Discomfort, Grimacing, Guarding, Headache Pain Intervention(s): Limited activity within patient's tolerance, Premedicated before session, Monitored during session, Repositioned, Patient requesting pain meds-RN notified     PT Goals (current goals can now be found in the care plan section) Acute Rehab PT Goals Patient Stated Goal: I want to get home.  in house rehab admission PT Goal Formulation: With patient Time For Goal Achievement: 09/25/21 Potential to Achieve Goals: Good Progress towards PT goals: Progressing toward goals    Frequency    Min 4X/week      PT Plan Current plan remains appropriate       AM-PAC PT "6 Clicks" Mobility   Outcome Measure  Help needed turning from your back to your side while in a flat bed without using bedrails?: A Lot Help needed moving from lying on your back to sitting on the side of a flat bed without using bedrails?: A Lot Help needed moving to and from a bed to a chair (including a wheelchair)?: A Little Help needed standing up from a chair using your arms (e.g., wheelchair or bedside chair)?: A Little Help needed to walk in hospital room?: Total (pt unable to complete 20 ft) Help needed climbing 3-5 steps with a railing? : A Lot 6 Click Score: 13    End of Session Equipment Utilized During Treatment: Gait belt Activity Tolerance: Patient limited by pain Patient left: in chair;with call bell/phone within reach;with chair alarm set Nurse Communication: Mobility status PT Visit Diagnosis: Other abnormalities of gait and mobility (R26.89);Pain Pain - part of body:  (back, headache)     Time: 1335-1405 PT Time Calculation (min) (ACUTE ONLY): 30 min  Charges:  $Therapeutic Exercise: 8-22 mins $Therapeutic Activity: 23-37 mins                     [place], PT, DPT   Acute Rehabilitation Department   Sandra Cockayne 09/15/2021, 2:55 PM

## 2021-09-16 DIAGNOSIS — S22079A Unspecified fracture of T9-T10 vertebra, initial encounter for closed fracture: Secondary | ICD-10-CM | POA: Diagnosis not present

## 2021-09-16 DIAGNOSIS — I5032 Chronic diastolic (congestive) heart failure: Secondary | ICD-10-CM | POA: Diagnosis not present

## 2021-09-16 DIAGNOSIS — Z950 Presence of cardiac pacemaker: Secondary | ICD-10-CM | POA: Diagnosis not present

## 2021-09-16 DIAGNOSIS — N179 Acute kidney failure, unspecified: Secondary | ICD-10-CM | POA: Diagnosis not present

## 2021-09-16 LAB — BPAM RBC
Blood Product Expiration Date: 202308302359
ISSUE DATE / TIME: 202308061441
Unit Type and Rh: 5100

## 2021-09-16 LAB — TYPE AND SCREEN
ABO/RH(D): O POS
Antibody Screen: NEGATIVE
Unit division: 0

## 2021-09-16 NOTE — Progress Notes (Signed)
Progress Note  Patient: [name] AOZ:[id number] DOB: [birth year]  DOA: 09/09/2021  DOS: 09/16/2021    Brief hospital course: [name] is a 76 y.o. female with medical history significant of CAD s/p CABG in 2016, HFpEF,s/p CRT-pacemaker, chronic hypoxemic respiratory failure on 2.5 to 3 L, adrenal insufficiency, T2DM, HTN, CKD stage 4, OSA, asthma s/p fall.   Pt was walking with walker and fell when she turned landing on her buttocks and hit her forehead. She is on Plavix. She denies any lightheadedness or dizziness. No chest pain or shortness of breath.    She was found to have an unstable at T9-10 fracture and neurosurgery has deemed it an unstable fracture and will need OR tomorrow.  There is also concerns of nonunion or recurrent fracture at the site of the previous ORIF of the left femur. Family is concerned because of her heart and kidney disease. She was seen earlier this year for hip fracture and postoperatively had ATN briefly requiring dialysis. Access was later removed. However pt tells me that she had follow with nephrology last week and was told she now needs dialysis indefinitely.  She is awaiting callback on next steps.   She currently has AKI with creatinine of 4.75 up from 4.03.  She takes 160 mg of furosemide daily and still appears hypervolemic on exam with lower extremity edema although she reports this is no worse than usual.  She remained on her home 2.5-3 L O2.  Assessment and Plan: Unstable T9 vertebral fracture: s/p T7-T11 pedicle screw placement 8/1 Dr. Zada Finders.  - No noted SCI, though patient was admitted for a fall at home and now has limited mobility. Would benefit from, and tolerate, intensive therapy 3 hours per day. Insurance denied CIR after peer-to-peer 8/4. Expedited appeal requested, still pending.  - Continue pain control.   - Will follow up with neurosurgery as outpatient. Follow-up/activity/wound care instructions placed per Dr.  Zada Finders.  Stage IV CKD: Pt reports she's told she'll be headed toward dialysis shortly, has no access currently. No acute indications for dialysis.  - Appreciate nephrology following closely. SCr stable, improved. - Renal diet  Chronic HFmrEF: Recent echo with LVEF 45-50%, apical hypokinesis, normal RV size and function, mildly elevated PASP.  - Appreciate nephrology, cardiology recommendations Re: diuretic dosing. Giving lasix '80mg'$  po qAM, '40mg'$  po qPM. - Continue metoprolol succinate. BP soft, hold hydralazine. Noting no evidence of sepsis, bleeding, dehydration. Recheck parameters in AM if remains inpatient. - Unable to start ACE/ARB/ARNI w/advanced CKD  Abdominal pain, constipation: Currently exam is reassuring, no obstipation, improving. CT inclusive of abd/pelvis on admission showed no significant abnormalities in that area. Lactic acid, lipase, CBC, CMP reassuring. XR 8/4 without obstruction, has now been having BMs.  - Continue regular bowel regimen. - Remain OOB as much as possible.  CAD s/p CABG 5v Oct 2016, HLD: No recent angina or advancing CHF symptoms.  - Cardiology consulted for perioperative risk evaluation, their assistance is appreciated. No further changes to the plan are recommended.  - NSG ok with continuing DAPT which is recommended to continue lifelong per patient's cardiologist. Continue aspirin, plavix - Continue beta blocker, statin.   Chronic hypoxemic respiratory failure, COPD without exacerbation: Remains on baseline 2-3L O2.  - Maintaining SpO2 >88% with 2-3L O2.   - Continue symbicort, prn bronchodilators, chronic prednisone continued.  OSA: Intolerant to CPAP  History of fracture of left hip: s/p ORIF in 03/2021 by Dr. Sammuel Hines. CT left hip suggests nonunion, for  which patient is at very high risk with comorbidities, chronic steroid.  - Orthopedics evaluated the patient, recommending outpatient follow up in lieu of repeat procedure which would carry high  morbidity risk. - Continue WBAT LLE  Morbid obesity, OHS: Weight loss recommended.   Acute postoperative blood loss anemia on anemia of CKD:  - 1u PRBCs for symptomatic anemia 8/6, hgb up as expected and symptoms improved.  Iatrogenic adrenal insufficiency: Due to chronic prednisone for COPD.  - Continue prednisone '10mg'$ .    T2DM: HbA1c 6.3%.   HTN:  - Continue imdur/hydralazine  Right carotid stenosis: s/p TCAR.   CHB s/p PPM: Noted. With BiV ICD for severe cardiomyopathy 2008, then CRT-P 2014 with improved function.  - Device interrogation post-operatively per report: 0 episode of VT or SVT, 97% RV paced, 98% LV paced, <1% AT/AF, normal device function, good battery life   GERD:  - Continue PPI, carafate  Gout: Quiescent.  - Continue allopurinol  Subjective: Feels better today than so far this hospitalization. Having BMs, working with therapy often, eager to participate. Feels stronger, less fatigued, no dyspnea since transfusion.  Objective: Vitals:   09/15/21 2016 09/16/21 0010 09/16/21 0807 09/16/21 1243  BP: 124/61 (!) 122/58 (!) 102/55 (!) 109/56  Pulse: 86 78 76 74  Resp: '18 18 18   '$ Temp: 98.3 F (36.8 C) 98.2 F (36.8 C) 97.7 F (36.5 C) 97.6 F (36.4 C)  TempSrc: Oral Oral  Oral  SpO2: 100% 99% 100% 100%  Weight:      Height:      Gen: 76 y.o. female in no distress Pulm: Nonlabored breathing O2. Clear. CV: Regular rate and rhythm. No murmur, rub, or gallop. No JVD, stable minimal dependent edema. GI: Abdomen soft, not tender, non-distended, with normoactive bowel sounds.  Ext: Warm, no deformities Skin: No new rashes, lesions or ulcers on visualized skin. Diffuse ecchymoses stable, back wound not examined today. Neuro: Alert and oriented. No focal neurological deficits. Psych: Judgement and insight appear fair. Mood euthymic & affect congruent. Behavior is appropriate.    Data Personally reviewed: CBC: Recent Labs  Lab 09/09/21 1438 09/09/21 1443  09/10/21 0235 09/11/21 0937 09/15/21 0458 09/15/21 2033  WBC 6.5  --  6.4 8.0 8.0  --   NEUTROABS  --   --   --  7.1  --   --   HGB 8.5* 9.2* 8.0* 8.1* 7.0* 8.2*  HCT 26.7* 27.0* 25.6* 27.1* 22.2* 26.0*  MCV 97.1  --  97.0 100.0 97.4  --   PLT 177  --  161 149* 130*  --    Basic Metabolic Panel: Recent Labs  Lab 09/09/21 1438 09/09/21 1443 09/10/21 0235 09/11/21 0937 09/12/21 0601 09/15/21 0458  NA 134* 132* 138 140 140 139  K 3.9 3.9 3.2* 3.9 3.8 3.0*  CL 91* 92* 95* 100 97* 95*  CO2 29  --  31 32 30 35*  GLUCOSE 179* 176* 109* 173* 125* 119*  BUN 118* >130* 109* 92* 94* 69*  CREATININE 4.75* 4.90* 3.99* 3.18* 3.03* 2.57*  CALCIUM 9.3  --  9.6 9.5 9.5 9.0   GFR: Estimated Creatinine Clearance: 19.5 mL/min (A) (by C-G formula based on SCr of 2.57 mg/dL (H)). Liver Function Tests: Recent Labs  Lab 09/09/21 1438 09/11/21 0937 09/12/21 0601  AST 20 55* 26  ALT 16 65* 42  ALKPHOS 93 84 74  BILITOT 0.6 0.6 0.7  PROT 6.5 6.4* 5.5*  ALBUMIN 3.6 3.7 3.3*   Recent  Labs  Lab 09/11/21 0937  LIPASE 38   Urine analysis:    Component Value Date/Time   COLORURINE STRAW (A) 09/09/2021 1644   APPEARANCEUR CLEAR 09/09/2021 1644   LABSPEC 1.006 09/09/2021 1644   PHURINE 6.0 09/09/2021 1644   GLUCOSEU NEGATIVE 09/09/2021 1644   HGBUR NEGATIVE 09/09/2021 1644   BILIRUBINUR NEGATIVE 09/09/2021 1644   KETONESUR NEGATIVE 09/09/2021 1644   PROTEINUR NEGATIVE 09/09/2021 1644   UROBILINOGEN 0.2 12/06/2014 1531   NITRITE NEGATIVE 09/09/2021 1644   LEUKOCYTESUR NEGATIVE 09/09/2021 1644      No results found.   Family Communication: None at bedside  Disposition: Status is: Inpatient Remains inpatient appropriate because: Awaiting expedited insurance appeal for CIR. Would recommend pursuing SNF at discharge if denied again.    Patrecia Pour, MD 09/16/2021 2:22 PM Page by [email or web address]

## 2021-09-16 NOTE — Progress Notes (Signed)
Inpatient Rehabilitation Admissions Coordinator   I await insurance determination on appeal for admit to CIR.  Danne Baxter, RN, MSN Rehab Admissions Coordinator [phone] 09/16/2021 12:49 PM

## 2021-09-16 NOTE — Progress Notes (Signed)
Pt seen on rounds, she's doing well - states her back feels much better than preop and the first few days post-op, no new complaints. Will sign off, should follow up with me in clinic when she gets out of CIR. She has absorbable sutures, so they do not need to be removed. Wound care / activity instructions / follow up instructions placed in Epic discharge tab.

## 2021-09-16 NOTE — Progress Notes (Signed)
Physical Therapy Treatment Patient Details Name: [name] MRN: [id number] DOB: November 12, 1945 Today's Date: 09/16/2021   History of Present Illness The pt is a 76 y/o female admitted 7/31 s/p fall sustaining unstable T9-10 fracture s/p T7-T11 posterior pedicle screw fixation.  PMHx: CAD s/p CABG, AICD, afib, CHF, HTN, IBA, OSA, stroke, DM2, , multiple Cardiac Caths, L IM nailing L hip, and angiograms.    PT Comments    Pt admitted with above diagnosis. Pt was able to ambulate with RW with min assist as pt lacks full right foot clearance at times and thus is unsteady as well as needs cues and assist to stay in close proximity to RW. Pt states she plans to have grandsons to help her at home and would really benefit from more PT on AIR for increasing her balance and endurance.  Will continue to follow acutely.  Pt currently with functional limitations due to balance and endurance deficits. Pt will benefit from skilled PT to increase their independence and safety with mobility to allow discharge to the venue listed below.      Recommendations for follow up therapy are one component of a multi-disciplinary discharge planning process, led by the attending physician.  Recommendations may be updated based on patient status, additional functional criteria and insurance authorization.  Follow Up Recommendations  Acute inpatient rehab (3hours/day) (HHPT and 24 hour care  if Rehab denies pt)     Assistance Recommended at Discharge Intermittent Supervision/Assistance  Patient can return home with the following A little help with walking and/or transfers;A little help with bathing/dressing/bathroom;Assistance with cooking/housework;Assist for transportation;Help with stairs or ramp for entrance   Equipment Recommendations  None recommended by PT    Recommendations for Other Services Rehab consult;OT consult     Precautions / Restrictions Precautions Precautions: Fall Restrictions Weight Bearing  Restrictions: No     Mobility  Bed Mobility               General bed mobility comments: in chair on arrival    Transfers Overall transfer level: Needs assistance Equipment used: Rolling walker (2 wheels) Transfers: Sit to/from Stand Sit to Stand: Min assist, Min guard           General transfer comment: minA initially, cues for hand placement. pt able to stand with minG on repeated attempts    Ambulation/Gait Ambulation/Gait assistance: Min assist Gait Distance (Feet): 20 Feet Assistive device: Rolling walker (2 wheels) Gait Pattern/deviations: Decreased stride length, Shuffle, Wide base of support, Step-to pattern Gait velocity: slow Gait velocity interpretation: <1.31 ft/sec, indicative of household ambulator   General Gait Details: cues for increased stride length and positioning in RW, limited by pain and fatigue, decr right LE foot clearance   Stairs             Wheelchair Mobility    Modified Rankin (Stroke Patients Only)       Balance Overall balance assessment: Needs assistance Sitting-balance support: No upper extremity supported, Single extremity supported, Feet supported Sitting balance-Leahy Scale: Fair Sitting balance - Comments: limited dynamically'   Standing balance support: During functional activity, Bilateral upper extremity supported Standing balance-Leahy Scale: Poor Standing balance comment: reliant on external support                            Cognition Arousal/Alertness: Awake/alert Behavior During Therapy: WFL for tasks assessed/performed Overall Cognitive Status: Within Functional Limits for tasks assessed  General Comments: appears WFL, conitnue assessment        Exercises General Exercises - Lower Extremity Ankle Circles/Pumps: AROM, Both, 20 reps, Seated Long Arc Quad: AROM, Both, 10 reps, Seated Hip Flexion/Marching: AROM, Both, 10 reps, Seated     General Comments General comments (skin integrity, edema, etc.): VSS on 3LO2      Pertinent Vitals/Pain Pain Assessment Pain Assessment: Faces Faces Pain Scale: Hurts little more Pain Location: sx thoracic incision Pain Descriptors / Indicators: Discomfort, Grimacing, Guarding, Headache Pain Intervention(s): Limited activity within patient's tolerance, Monitored during session, Repositioned    Home Living                          Prior Function            PT Goals (current goals can now be found in the care plan section) Progress towards PT goals: Progressing toward goals    Frequency    Min 4X/week      PT Plan Current plan remains appropriate    Co-evaluation              AM-PAC PT "6 Clicks" Mobility   Outcome Measure  Help needed turning from your back to your side while in a flat bed without using bedrails?: A Lot Help needed moving from lying on your back to sitting on the side of a flat bed without using bedrails?: A Lot Help needed moving to and from a bed to a chair (including a wheelchair)?: A Little Help needed standing up from a chair using your arms (e.g., wheelchair or bedside chair)?: A Little Help needed to walk in hospital room?: Total Help needed climbing 3-5 steps with a railing? : A Lot 6 Click Score: 13    End of Session Equipment Utilized During Treatment: Gait belt;Oxygen Activity Tolerance: Patient limited by pain;Patient limited by fatigue Patient left: in chair;with call bell/phone within reach;with chair alarm set Nurse Communication: Mobility status PT Visit Diagnosis: Other abnormalities of gait and mobility (R26.89);Pain Pain - part of body:  (back, headache)     Time: 6659-9357 PT Time Calculation (min) (ACUTE ONLY): 23 min  Charges:  $Gait Training: 8-22 mins $Therapeutic Exercise: 8-22 mins                     [facility] M,PT Acute Rehab Services [phone]    Alvira Philips 09/16/2021, 3:51 PM

## 2021-09-17 DIAGNOSIS — Z950 Presence of cardiac pacemaker: Secondary | ICD-10-CM | POA: Diagnosis not present

## 2021-09-17 DIAGNOSIS — I5032 Chronic diastolic (congestive) heart failure: Secondary | ICD-10-CM | POA: Diagnosis not present

## 2021-09-17 DIAGNOSIS — S22079A Unspecified fracture of T9-T10 vertebra, initial encounter for closed fracture: Secondary | ICD-10-CM | POA: Diagnosis not present

## 2021-09-17 DIAGNOSIS — N179 Acute kidney failure, unspecified: Secondary | ICD-10-CM | POA: Diagnosis not present

## 2021-09-17 MED ORDER — FUROSEMIDE 80 MG PO TABS
80.0000 mg | ORAL_TABLET | Freq: Two times a day (BID) | ORAL | Status: DC
Start: 2021-09-17 — End: 2021-10-11

## 2021-09-17 MED ORDER — OXYCODONE HCL 5 MG PO TABS: 5.0000 mg | ORAL_TABLET | Freq: Four times a day (QID) | ORAL | 0 refills | Status: AC | PRN

## 2021-09-17 NOTE — Progress Notes (Signed)
Inpatient Rehabilitation Admissions Coordinator   I await insurance determination on appeal for possible CIR admit.  Danne Baxter, RN, MSN Rehab Admissions Coordinator [phone] 09/17/2021 10:20 AM

## 2021-09-17 NOTE — Progress Notes (Signed)
PT Cancellation Note  Patient Details Name: [name] MRN: [id number] DOB: [birth year]   Cancelled Treatment:    Reason Eval/Treat Not Completed: Patient declined, no reason specified Pt initially agreeable to therapy. Changed her mind after room/equipment set-up stating too much pain and reports she is unable to receive medicine for over an hour from now. Will attempt to follow-up this afternoon as time allows.  Ellouise Newer 09/17/2021, 10:36 AM

## 2021-09-17 NOTE — Discharge Summary (Addendum)
Physician Discharge Summary   Patient: [name] MRN: [id number] DOB: 03/09/1945  Admit date:     09/09/2021  Discharge date: 09/17/21  Discharge Physician: Patrecia Pour   PCP: Lujean Amel, MD   Recommendations at discharge:  Follow up with neurosurgery, Dr. Zada Finders in 2 weeks. Follow up with PCP and/or nephrology in next 1-2 weeks with recheck BMP and CBC. Follow up with orthopedics, Dr. Sammuel Hines, after discharge to discuss further management of nonunion s/p left femur IM nail.  Discharge Diagnoses: Principal Problem:   T9 vertebral fracture (HCC) Active Problems:   Gastroesophageal reflux disease   OSA (obstructive sleep apnea)   HTN (hypertension), benign   Cardiac resynchronization therapy pacemaker (CRT-P) in place   S/P CABG x 5   Chronic diastolic CHF (congestive heart failure) (HCC)   ESRD (end stage renal disease) ([place])   Acute kidney injury superimposed on chronic kidney disease ([place])   Chronic hypoxemic respiratory failure ([place])   History of fracture of left hip   Iatrogenic adrenal insufficiency [facility])  Hospital Course: [name] is a 75 y.o. female with medical history significant of CAD s/p CABG in 2016, HFpEF,s/p CRT-pacemaker, chronic hypoxemic respiratory failure on 2.5 to 3 L, adrenal insufficiency, T2DM, HTN, CKD stage 4, OSA, asthma who presented from home with a mechanical fall   She was found to have an unstable at T9-10 fracture and neurosurgery was consulted, took the patient for T7-T11 pedicle screw placement 8/1. Cardiology and nephrology were also involved in care given her very high risk of perioperative morbidity and mortality, though fortunately her chronica medical conditions have remained stable. Postoperative course complicated by pain and blood loss anemia which has stabilized with 1u PRBCs.   Initially required significant assistance, so CIR placement was sought. This was denied by the patient's insurance, so physical therapy was  continued while inpatient with good improvement. She has improved enough to go home with significant family assistance and home health therapies on 8/8.   Assessment and Plan: Unstable T9 vertebral fracture: s/p T7-T11 pedicle screw placement 8/1 Dr. Zada Finders.  - Insurance denied CIR after peer-to-peer 8/4, upheld independent appeal. Patient's mobility has improved significantly with frequent physical therapy provided while inpatient and she will be discharged home with home health therapies.  - Continue pain control, substitute oxycodone for home tramadol while postoperative pain is severe. Also w/tylenol, avoid NSAIDs.    - Will follow up with neurosurgery as outpatient. Follow-up/activity/wound care instructions placed per Dr. Zada Finders.   Stage IV CKD: Pt reports she's told she'll be headed toward dialysis shortly, has no access currently. No acute indications for dialysis.  - Appreciate nephrology following closely. SCr stable, improved. Will follow up with [facility] after discharge. - Renal diet   Chronic HFmrEF: Recent echo with LVEF 45-50%, apical hypokinesis, normal RV size and function, mildly elevated PASP.  - Appreciate nephrology, cardiology recommendations Re: diuretic dosing. Will discharge on lasix '80mg'$  po BID. - Continue metoprolol succinate.   - Unable to start ACE/ARB/ARNI w/advanced CKD   Abdominal pain, constipation: Currently exam is reassuring, no obstipation, improving. CT inclusive of abd/pelvis on admission showed no significant abnormalities in that area. Lactic acid, lipase, CBC, CMP reassuring. XR 8/4 without obstruction, has now been having BMs.  - Continue regular bowel regimen. - Remain OOB as much as possible.   CAD s/p CABG 5v Oct 2016, HLD: No recent angina or advancing CHF symptoms.  - Cardiology consulted for perioperative risk evaluation, their assistance is appreciated.  No further changes to the plan are recommended.  - NSG ok with continuing DAPT which is  recommended to continue lifelong per patient's cardiologist. Continue aspirin, plavix - Continue beta blocker, statin.    Chronic hypoxemic respiratory failure, COPD without exacerbation:   - Maintaining adequate oxygenation, normal respiratory effort on home 2-3L O2.   - Continue symbicort, prn bronchodilators, chronic prednisone continued.   Hypokalemia: Continue standing supplement.  OSA: Intolerant to CPAP   History of fracture of left hip: s/p ORIF in 03/2021 by Dr. Sammuel Hines. CT left hip suggests nonunion, for which patient is at very high risk with comorbidities, chronic steroid.  - Orthopedics evaluated the patient, recommending outpatient follow up in lieu of repeat procedure which would carry high morbidity risk. - Continue WBAT LLE   Morbid obesity, OHS: Weight loss recommended.    Acute postoperative blood loss anemia on anemia of CKD:  - 1u PRBCs for symptomatic anemia 8/6, hgb up as expected and symptoms improved.   Iatrogenic adrenal insufficiency: Due to chronic prednisone for COPD.  - Continue prednisone '10mg'$ .     T2DM: HbA1c 6.3%.    HTN:  - Continue imdur/hydralazine   Right carotid stenosis: s/p TCAR.    CHB s/p PPM: Noted. With BiV ICD for severe cardiomyopathy 2008, then CRT-P 2014 with improved function.  - Device interrogation post-operatively per report: 0 episode of VT or SVT, 97% RV paced, 98% LV paced, <1% AT/AF, normal device function, good battery life    GERD:  - Continue PPI, carafate   Gout: Quiescent.  - Continue allopurinol      Pain control - [place] Controlled Substance Reporting System database was reviewed. and patient was instructed, not to drive, operate heavy machinery, perform activities at heights, swimming or participation in water activities or provide baby-sitting services while on Pain, Sleep and Anxiety Medications; until their outpatient Physician has advised to do so again. Also recommended to not to take more than  prescribed Pain, Sleep and Anxiety Medications.  Consultants: Neurosurgery, Cardiology, Nephrology, Orthopedics Procedures performed:  T7-T11 pedicle screw placement 8/1 Dr. Zada Finders.   Disposition: Home Diet recommendation: Renal, heart healthy DISCHARGE MEDICATION: Allergies as of 09/17/2021       Reactions   Ivp Dye [iodinated Contrast Media] Shortness Of Breath   No reaction to PO contrast with non-ionic dye.06-25-2014/rsm   Shellfish Allergy Anaphylaxis   Sulfa Antibiotics Shortness Of Breath   Iodine Hives   Atorvastatin Other (See Comments)   Pt states "causes bilateral leg pain/cramps."   Benadryl [diphenhydramine] Other (See Comments)   "THIS DRIVES ME CRAZY AND MAKES ME FEEL LIKE I AM DYING"   Colchicine Nausea And Vomiting   Doxycycline Other (See Comments)   Unknown reaction, per patient   Uloric [febuxostat] Other (See Comments)   Unknown reaction   Cephalexin Itching, Rash   Zithromax [azithromycin] Rash        Medication List     TAKE these medications    Accu-Chek Aviva Plus test strip Generic drug: glucose blood Check blood sugar twice daily   Accu-Chek Softclix Lancets lancets Check blood sugar twice daily   acetaminophen 500 MG tablet Commonly known as: TYLENOL Take 1,000 mg by mouth 3 (three) times daily as needed (for pain or headaches).   albuterol 108 (90 Base) MCG/ACT inhaler Commonly known as: VENTOLIN HFA Inhale 2 puffs into the lungs every 6 (six) hours as needed for wheezing or shortness of breath.   albuterol (2.5 MG/3ML) 0.083% nebulizer solution  Commonly known as: PROVENTIL Take 3 mLs (2.5 mg total) by nebulization every 6 (six) hours as needed for wheezing or shortness of breath.   allopurinol 100 MG tablet Commonly known as: ZYLOPRIM Take 1 tablet (100 mg total) by mouth daily. What changed: when to take this   aspirin EC 81 MG tablet Take 1 tablet (81 mg total) by mouth daily. What changed: when to take this   B-D SINGLE  USE SWABS REGULAR Pads   budesonide-formoterol 160-4.5 MCG/ACT inhaler Commonly known as: SYMBICORT Inhale 2 puffs into the lungs 2 (two) times daily.   clopidogrel 75 MG tablet Commonly known as: PLAVIX Take 1 tablet (75 mg total) by mouth daily. What changed: when to take this   cyanocobalamin 1000 MCG tablet Commonly known as: VITAMIN B12 Take 1 tablet (1,000 mcg total) by mouth daily.   cycloSPORINE 0.05 % ophthalmic emulsion Commonly known as: RESTASIS Place 1 drop into both eyes 2 (two) times daily.   EPINEPHrine 0.3 mg/0.3 mL Soaj injection Commonly known as: EPI-PEN Inject 0.3 mg into the muscle once as needed for anaphylaxis (severe allergic reaction).   furosemide 80 MG tablet Commonly known as: LASIX Take 1 tablet (80 mg total) by mouth 2 (two) times daily. What changed:  how much to take when to take this   hydrALAZINE 25 MG tablet Commonly known as: APRESOLINE Take 1 tablet (25 mg total) by mouth every 8 (eight) hours.   hydrOXYzine 25 MG tablet Commonly known as: ATARAX Take 25 mg by mouth 2 (two) times daily as needed for itching.   isosorbide mononitrate 30 MG 24 hr tablet Commonly known as: IMDUR TAKE 3 TABLETS BY MOUTH  DAILY What changed: when to take this   Lidocaine 0.5 % Aero Place 1 spray rectally every 6 (six) hours as needed (hemorrhoids). Preparation H rapid relief lidocaine spray   linaclotide 145 MCG Caps capsule Commonly known as: LINZESS Take 145 mcg by mouth daily before breakfast.   metoprolol succinate 50 MG 24 hr tablet Commonly known as: TOPROL-XL Take 1 tablet (50 mg total) by mouth daily. Take with or immediately following a meal.   nitroGLYCERIN 0.4 MG SL tablet Commonly known as: NITROSTAT Place 1 tablet (0.4 mg total) under the tongue every 5 (five) minutes as needed for chest pain.   ondansetron 4 MG tablet Commonly known as: ZOFRAN Take 4 mg by mouth daily as needed for nausea or vomiting.   oxyCODONE 5 MG  immediate release tablet Commonly known as: Oxy IR/ROXICODONE Take 1-2 tablets (5-10 mg total) by mouth every 6 (six) hours as needed for up to 5 days for moderate pain or severe pain.   OXYGEN Inhale 2-3 L/min into the lungs continuous.   oxymetazoline 0.05 % nasal spray Commonly known as: AFRIN Place 1 spray into both nostrils 2 (two) times daily as needed for congestion.   pantoprazole 40 MG tablet Commonly known as: PROTONIX Take 1 tablet (40 mg total) by mouth 2 (two) times daily.   polyethylene glycol 17 g packet Commonly known as: MIRALAX / GLYCOLAX Take 17 g by mouth daily as needed (constipation).   Potassium Chloride ER 20 MEQ Tbcr Take 20 mEq by mouth daily in the afternoon.   predniSONE 10 MG tablet Commonly known as: DELTASONE Take 1 tablet (10 mg total) by mouth daily with breakfast.   rosuvastatin 20 MG tablet Commonly known as: CRESTOR Take 1 tablet (20 mg total) by mouth every evening. NEED OV.   sucralfate 1  g tablet Commonly known as: CARAFATE Take 1 tablet (1 g total) by mouth 4 (four) times daily -  with meals and at bedtime. What changed:  how much to take when to take this   traMADol 50 MG tablet Commonly known as: ULTRAM Take 50 mg by mouth daily as needed (pain).        Follow-up Information     [name], [name], MD Follow up.   Specialty: Family Medicine Contact information: [place] [address] [phone]                Discharge Exam: Danley Danker Weights   09/09/21 1431  Weight: 89.8 kg  BP 122/63   Pulse 77   Temp 98.2 F (36.8 C) (Oral)   Resp 18   Ht '5\' 3"'$  (1.6 m)   Wt 89.8 kg   SpO2 100%   BMI 35.07 kg/m   Gen: 76 y.o. female in no distress Pulm: Nonlabored breathing O2. Clear. CV: Regular rate and rhythm. No murmur, rub, or gallop. No JVD, stable minimal dependent edema. GI: Abdomen soft, not tender, non-distended, with normoactive bowel sounds.  Ext: Warm, no  deformities Skin: No new rashes, lesions or ulcers on visualized skin. Diffuse ecchymoses stable, back wound hemostatic without spreading erythema or discharge.   Condition at discharge: stable  The results of significant diagnostics from this hospitalization (including imaging, microbiology, ancillary and laboratory) are listed below for reference.   Imaging Studies: DG Abd Portable 1V  Result Date: 09/13/2021 CLINICAL DATA:  [id number], constipation postop pain EXAM: PORTABLE ABDOMEN - 1 VIEW COMPARISON:  August 27, 2021 FINDINGS: Study is suboptimal due to underpenetration and large body habitus. There has been interval thoracic spine surgery with rods and screw devices seen transfixing the thoracic spine. Sternotomy wires. Cardiac pacer wires, stable. Again seen is an intramedullary rod and compression screw device at the left femoral neck. Bowel-gas pattern is nonobstructive. Moderately large stool burden in the visualized cecum and ascending colon. Moderately severe lumbar spondylosis. IMPRESSION: Study is suboptimal due to underpenetration and large body habitus. There has been interval thoracic spine surgery with rods and screw devices in place. Moderately large stool burden in the visualized colon of likely constipation. Bowel-gas pattern is nonobstructive. Electronically Signed   By: Frazier Richards M.D.   On: 09/13/2021 10:37   DG Thoracic Spine 2 View  Result Date: 09/11/2021 CLINICAL DATA:  Back pain EXAM: THORACIC SPINE 2 VIEWS COMPARISON:  Films from the previous day. FINDINGS: Pedicle screws are again seen extending from T7-T11 stable in appearance from the prior exam. Posterior fixation is now seen. No compression deformities are seen. Mild degenerative changes of the thoracolumbar spine are noted. IMPRESSION: Status post T7-T11 fusion. Electronically Signed   By: Inez Catalina M.D.   On: 09/11/2021 22:14   DG Thoracic Spine 1 View  Result Date: 09/10/2021 CLINICAL DATA:  T7-T11 screw  placement. EXAM: OPERATIVE THORACIC SPINE 1 VIEW(S) COMPARISON:  Thoracic spine CT 09/09/2021 FINDINGS: Cross-table lateral view of the thoracic spine demonstrates pedicle screws at T7-T11. IMPRESSION: As above. Electronically Signed   By: Ronney Asters M.D.   On: 09/10/2021 20:11   DG O-ARM IMAGE ONLY/NO REPORT  Result Date: 09/10/2021 There is no Radiologist interpretation  for this exam.  CT HIP LEFT WO CONTRAST  Result Date: 09/10/2021 CLINICAL DATA:  Hip trauma, fracture suspected, xray done previous ORIF to left hip, questionable left hip non-union vs recurrent fracture see on CT chest  EXAM: CT OF THE LEFT HIP WITHOUT CONTRAST TECHNIQUE: Multidetector CT imaging of the left hip was performed according to the standard protocol. Multiplanar CT image reconstructions were also generated. RADIATION DOSE REDUCTION: This exam was performed according to the departmental dose-optimization program which includes automated exposure control, adjustment of the mA and/or kV according to patient size and/or use of iterative reconstruction technique. COMPARISON:  CT 09/09/2021 FINDINGS: Bones/Joint/Cartilage Prior intramedullary narrowing of the left femur with distal interlocking screw for a comminuted intertrochanteric fracture. Fracture fragments are in a similar configuration without evidence of new fracture. There is callus formation without any bony bridging. There is soft tissue thickening and fluid along the fracture site. The distal interlocking screws are intact. There is sclerosis of the femoral head which is increased since March 2023, potentially reflecting developing AVN. No articular surface collapse. There is mild to moderate left hip osteoarthritis. Ligaments Suboptimally assessed by CT. Muscles and Tendons High-grade gluteal and hamstring muscle atrophy. No intramuscular collection. No acute myotendinous abnormality by CT. Soft tissues No focal fluid collection.  Colonic diverticulosis. IMPRESSION:  Prior intramedullary nailing of the femur for a comminuted intertrochanteric fracture. There is callus formation without any significant bony bridging, soft tissue thickening and fluid at the fracture site suggesting nonunion. No evidence of new fracture. Electronically Signed   By: Maurine Simmering M.D.   On: 09/10/2021 08:34   CT CHEST ABDOMEN PELVIS WO CONTRAST  Result Date: 09/09/2021 CLINICAL DATA:  Blunt trauma EXAM: CT CHEST, ABDOMEN AND PELVIS WITHOUT CONTRAST TECHNIQUE: Multidetector CT imaging of the chest, abdomen and pelvis was performed following the standard protocol without IV contrast. RADIATION DOSE REDUCTION: This exam was performed according to the departmental dose-optimization program which includes automated exposure control, adjustment of the mA and/or kV according to patient size and/or use of iterative reconstruction technique. COMPARISON:  Chest radiography same day.  CT 11/20/2017. FINDINGS: CT CHEST FINDINGS Cardiovascular: Pacemaker. Cardiomegaly. Coronary artery calcification and aortic atherosclerotic calcification. Mediastinum/Nodes: No evidence of mediastinal bleeding. No adenopathy or mass. Lungs/Pleura: No pneumothorax. Areas of linear atelectasis in both lungs. 6 mm pulmonary nodule on the right image 73. 5 mm pulmonary nodule on the right image 87. No nodule seen on the left. These are similar to the study of October 2019 and therefore benign. Musculoskeletal: No rib fracture. Acute fracture of the thoracic spine at T9, possibly due to flexion or extension mechanisms. No displaced bone compromising the spinal canal. CT ABDOMEN PELVIS FINDINGS Hepatobiliary: Liver parenchyma is normal. Pancreas: Negative Spleen: Negative Adrenals/Urinary Tract: Adrenal glands are normal. Kidneys are normal. Bladder is normal. Stomach/Bowel: No evidence of bowel injury.  No acute bowel finding. Vascular/Lymphatic: Aortic atherosclerosis. No aneurysm. IVC is normal. No adenopathy. Reproductive: No  pelvic mass. Other: No free fluid or air. Musculoskeletal: Previous ORIF of left hip fracture. There is either nonunion or recurrent fracture at this site. IMPRESSION: Acute fracture of the thoracic spine at the T9 level which could be due to hyperflexion or hyper extension mechanisms. No evidence of malalignment or bone encroachment upon the canal. Nonunion or recurrent fracture at the site of previous ORIF of the left femur. No abdominal organ injury. Two nodules in the lower right lung, stable since 2019 and therefore benign. Electronically Signed   By: Nelson Chimes M.D.   On: 09/09/2021 16:27   CT T-SPINE NO CHARGE  Result Date: 09/09/2021 CLINICAL DATA:  Trauma EXAM: CT THORACIC AND LUMBAR SPINE WITHOUT CONTRAST TECHNIQUE: Multidetector CT imaging of the thoracic and lumbar  spine was performed without contrast. Multiplanar CT image reconstructions were also generated. RADIATION DOSE REDUCTION: This exam was performed according to the departmental dose-optimization program which includes automated exposure control, adjustment of the mA and/or kV according to patient size and/or use of iterative reconstruction technique. COMPARISON:  None Available. FINDINGS: CT THORACIC SPINE FINDINGS Alignment: Normal. Vertebrae: The vertebral bodies are diffusely osteopenic. There are flowing anterior osteophytes throughout the thoracic spine consistent with background diffuse idiopathic skeletal hyperostosis. There is an acute distracted fracture of the T9 vertebral body with a fracture plane extending through the anterior endplate involving the left aspect of the bridging osteophytes between the T9 and T10 vertebral bodies extending posteriorly to the posterior aspect of the superior endplate. There is no evidence of extension to the posterior elements. There is no bony retropulsion. No other definite fracture is seen within the confines of diffuse osteopenia. There is no suspicious osseous lesion. Paraspinal and other  soft tissues: The paraspinal soft tissues are unremarkable. There is no significant prevertebral hematoma. The intrathoracic contents are assessed on the separately dictated CT chest. Disc levels: There is multilevel disc space narrowing with degenerative endplate change and bulky flowing anterior osteophytes throughout the thoracic spine. There is overall mild multilevel facet arthropathy resulting in up to mild-to-moderate bilateral neural foraminal stenosis at T8-T9 and T9-T10. There is no evidence of high-grade osseous spinal canal stenosis. CT LUMBAR SPINE FINDINGS Segmentation: Standard; the lowest formed disc space is designated L5-S1. Alignment: There is grade 1 anterolisthesis of L5 on S1. Alignment is otherwise normal. Vertebrae: Vertebral body heights are preserved. There are flowing anterior osteophytes extending from the lower thoracic spine through the L2 vertebral body consistent with diffuse idiopathic skeletal hyperostosis. There is no definite acute fracture within the confines of diffusely osteopenic bones. There is no suspicious osseous lesion. Paraspinal and other soft tissues: The paraspinal soft tissues are unremarkable. The abdominal and pelvic viscera are assessed on the separately dictated CT abdomen/pelvis. Disc levels: There is marked multilevel disc desiccation and narrowing with multilevel vacuum disc phenomenon. There is advanced multilevel facet arthropathy most severe at L4-L5 and L5-S1. There are disc bulges throughout the lumbar spine resulting in at least mild-to-moderate spinal canal stenosis at L1-L2 and L2-L3. The disc bulges and facet arthropathy results in mild-to-moderate left neural foraminal stenosis at L3-L4, moderate bilateral neural foraminal stenosis at L4-L5, and severe bilateral neural foraminal stenosis at L5-S1. IMPRESSION: 1. Acute distracted fracture of the T9 vertebral body extending from the anterior endplate involving the bridging osteophyte between the T9 and  T10 vertebral bodies posteriorly to the posterior aspect of the superior endplate, superimposed on pre-existing diffuse idiopathic skeletal hyperostosis. No extension to the posterior elements or bony retropulsion. Neurosurgical consultation is recommended. 2. No other definite evidence of acute fracture in the thoracolumbar spine, though evaluation is limited due to significant osteopenia. 3. Multilevel degenerative changes as above. Electronically Signed   By: Valetta Mole M.D.   On: 09/09/2021 16:12   CT L-SPINE NO CHARGE  Result Date: 09/09/2021 CLINICAL DATA:  Trauma EXAM: CT THORACIC AND LUMBAR SPINE WITHOUT CONTRAST TECHNIQUE: Multidetector CT imaging of the thoracic and lumbar spine was performed without contrast. Multiplanar CT image reconstructions were also generated. RADIATION DOSE REDUCTION: This exam was performed according to the departmental dose-optimization program which includes automated exposure control, adjustment of the mA and/or kV according to patient size and/or use of iterative reconstruction technique. COMPARISON:  None Available. FINDINGS: CT THORACIC SPINE FINDINGS Alignment: Normal. Vertebrae: The  vertebral bodies are diffusely osteopenic. There are flowing anterior osteophytes throughout the thoracic spine consistent with background diffuse idiopathic skeletal hyperostosis. There is an acute distracted fracture of the T9 vertebral body with a fracture plane extending through the anterior endplate involving the left aspect of the bridging osteophytes between the T9 and T10 vertebral bodies extending posteriorly to the posterior aspect of the superior endplate. There is no evidence of extension to the posterior elements. There is no bony retropulsion. No other definite fracture is seen within the confines of diffuse osteopenia. There is no suspicious osseous lesion. Paraspinal and other soft tissues: The paraspinal soft tissues are unremarkable. There is no significant prevertebral  hematoma. The intrathoracic contents are assessed on the separately dictated CT chest. Disc levels: There is multilevel disc space narrowing with degenerative endplate change and bulky flowing anterior osteophytes throughout the thoracic spine. There is overall mild multilevel facet arthropathy resulting in up to mild-to-moderate bilateral neural foraminal stenosis at T8-T9 and T9-T10. There is no evidence of high-grade osseous spinal canal stenosis. CT LUMBAR SPINE FINDINGS Segmentation: Standard; the lowest formed disc space is designated L5-S1. Alignment: There is grade 1 anterolisthesis of L5 on S1. Alignment is otherwise normal. Vertebrae: Vertebral body heights are preserved. There are flowing anterior osteophytes extending from the lower thoracic spine through the L2 vertebral body consistent with diffuse idiopathic skeletal hyperostosis. There is no definite acute fracture within the confines of diffusely osteopenic bones. There is no suspicious osseous lesion. Paraspinal and other soft tissues: The paraspinal soft tissues are unremarkable. The abdominal and pelvic viscera are assessed on the separately dictated CT abdomen/pelvis. Disc levels: There is marked multilevel disc desiccation and narrowing with multilevel vacuum disc phenomenon. There is advanced multilevel facet arthropathy most severe at L4-L5 and L5-S1. There are disc bulges throughout the lumbar spine resulting in at least mild-to-moderate spinal canal stenosis at L1-L2 and L2-L3. The disc bulges and facet arthropathy results in mild-to-moderate left neural foraminal stenosis at L3-L4, moderate bilateral neural foraminal stenosis at L4-L5, and severe bilateral neural foraminal stenosis at L5-S1. IMPRESSION: 1. Acute distracted fracture of the T9 vertebral body extending from the anterior endplate involving the bridging osteophyte between the T9 and T10 vertebral bodies posteriorly to the posterior aspect of the superior endplate, superimposed  on pre-existing diffuse idiopathic skeletal hyperostosis. No extension to the posterior elements or bony retropulsion. Neurosurgical consultation is recommended. 2. No other definite evidence of acute fracture in the thoracolumbar spine, though evaluation is limited due to significant osteopenia. 3. Multilevel degenerative changes as above. Electronically Signed   By: Valetta Mole M.D.   On: 09/09/2021 16:12   CT Head Wo Contrast  Result Date: 09/09/2021 CLINICAL DATA:  Head trauma, minor (Age >= 65y); Neck trauma (Age >= 65y) EXAM: CT HEAD WITHOUT CONTRAST CT CERVICAL SPINE WITHOUT CONTRAST TECHNIQUE: Multidetector CT imaging of the head and cervical spine was performed following the standard protocol without intravenous contrast. Multiplanar CT image reconstructions of the cervical spine were also generated. RADIATION DOSE REDUCTION: This exam was performed according to the departmental dose-optimization program which includes automated exposure control, adjustment of the mA and/or kV according to patient size and/or use of iterative reconstruction technique. COMPARISON:  None Available. FINDINGS: CT HEAD FINDINGS Brain: No evidence of acute infarction, hemorrhage, hydrocephalus, extra-axial collection or mass lesion/mass effect. Vascular: Calcific intracranial atherosclerosis. Skull: No acute fracture. Sinuses/Orbits: Mild paranasal sinus mucosal thickening. No acute orbital findings. Other: No mastoid effusions. CT CERVICAL SPINE FINDINGS Alignment: Straightening.  No substantial sagittal subluxation. Skull base and vertebrae: Vertebral body heights are maintained. No evidence of acute fracture. C4-C5 ACDF with evidence of some bony fusion across the disc space. Soft tissues and spinal canal: No prevertebral fluid or swelling. No visible canal hematoma. Disc levels: Moderate to severe multilevel degenerative disc disease and facet/uncovertebral hypertrophy with varying degrees of neural foraminal stenosis.  Severe craniocervical degenerative change including bony remodeling. Upper chest: Evaluated on concurrent chest abdomen/pelvis. IMPRESSION: 1. No evidence of acute intracranial abnormality. 2. No evidence of acute fracture or traumatic malalignment in the cervical spine. Electronically Signed   By: Margaretha Sheffield M.D.   On: 09/09/2021 15:37   CT Cervical Spine Wo Contrast  Result Date: 09/09/2021 CLINICAL DATA:  Head trauma, minor (Age >= 65y); Neck trauma (Age >= 65y) EXAM: CT HEAD WITHOUT CONTRAST CT CERVICAL SPINE WITHOUT CONTRAST TECHNIQUE: Multidetector CT imaging of the head and cervical spine was performed following the standard protocol without intravenous contrast. Multiplanar CT image reconstructions of the cervical spine were also generated. RADIATION DOSE REDUCTION: This exam was performed according to the departmental dose-optimization program which includes automated exposure control, adjustment of the mA and/or kV according to patient size and/or use of iterative reconstruction technique. COMPARISON:  None Available. FINDINGS: CT HEAD FINDINGS Brain: No evidence of acute infarction, hemorrhage, hydrocephalus, extra-axial collection or mass lesion/mass effect. Vascular: Calcific intracranial atherosclerosis. Skull: No acute fracture. Sinuses/Orbits: Mild paranasal sinus mucosal thickening. No acute orbital findings. Other: No mastoid effusions. CT CERVICAL SPINE FINDINGS Alignment: Straightening.  No substantial sagittal subluxation. Skull base and vertebrae: Vertebral body heights are maintained. No evidence of acute fracture. C4-C5 ACDF with evidence of some bony fusion across the disc space. Soft tissues and spinal canal: No prevertebral fluid or swelling. No visible canal hematoma. Disc levels: Moderate to severe multilevel degenerative disc disease and facet/uncovertebral hypertrophy with varying degrees of neural foraminal stenosis. Severe craniocervical degenerative change including bony  remodeling. Upper chest: Evaluated on concurrent chest abdomen/pelvis. IMPRESSION: 1. No evidence of acute intracranial abnormality. 2. No evidence of acute fracture or traumatic malalignment in the cervical spine. Electronically Signed   By: Margaretha Sheffield M.D.   On: 09/09/2021 15:37   DG Pelvis Portable  Result Date: 09/09/2021 CLINICAL DATA:  A 76 year old female presents following fall, patient is on blood thinners. EXAM: PORTABLE PELVIS 1-2 VIEWS COMPARISON:  August 27, 2021. FINDINGS: This study is markedly limited by patient body habitus. Scattered loops of gas and stool filled bowel project over the lower abdomen and over the pelvis. These bowel loops limited pelvic assessment. No displaced fracture or destructive bone lesion of the pelvis accounting for limitations on the current study. Degenerative changes are present in the bilateral hips. Evidence of prior ORIF with femoral nailing on the LEFT. The distal aspect of the LEFT femoral nail is not assessed as it passes beyond the mid shaft of the femur. Callus formation at the site of prior fracture in the intratrochanteric LEFT femur is similar to previous imaging. Soft tissues are otherwise unremarkable. Degenerative changes are noted in the spine. IMPRESSION: 1. Markedly limited study due to patient body habitus. 2. No displaced fracture or destructive bone lesion of the pelvis. 3. Evidence of prior ORIF on the LEFT with femoral nailing, femoral nail extends beyond the mid femur. 4. Callus formation at the site of prior fracture in the intratrochanteric LEFT femur is similar to previous imaging. Electronically Signed   By: Zetta Bills M.D.   On: 09/09/2021 14:52  DG Chest Port 1 View  Result Date: 09/09/2021 CLINICAL DATA:  76 year old female presenting with fall on blood thinners. EXAM: PORTABLE CHEST 1 VIEW COMPARISON:  July 15, 2021. FINDINGS: EKG leads project over the chest.  Post median sternotomy for CABG. RIGHT sided multi lead pacer  defibrillator remains in place. Cardiomediastinal contours and hilar structures are stable with signs of median sternotomy for CABG. No lobar consolidation.  No sign of pneumothorax. On limited assessment there is no acute skeletal finding. IMPRESSION: No acute radiographic findings in the chest. Electronically Signed   By: Zetta Bills M.D.   On: 09/09/2021 14:49   DG Abd 1 View  Result Date: 08/28/2021 CLINICAL DATA:  Constipation. Abdominal pain. Evaluation for stool burden. EXAM: ABDOMEN - 1 VIEW COMPARISON:  Chest x-ray 08/14/2021. Lumbar spine 06/14/2021. CT 04/21/2021. FINDINGS: Large amount of stool noted throughout the colon. Nondistended air-filled loops of small bowel noted. No colonic distention. No free air visualized. Hemidiaphragms incompletely imaged. Aortoiliac and visceral atherosclerotic vascular calcification again noted. Degenerative changes lumbar spine and both hips. Postsurgical changes left hip. Fracture lucency remains visible in the proximal left femur. Cardiac pacing wires noted. Left pleural effusion cannot be excluded. IMPRESSION: 1. Large amount of stool noted throughout the colon. No colonic distention. 2.  Aortoiliac and visceral atherosclerotic vascular disease. 3. Degenerative changes lumbar spine and both hips. Postsurgical changes left hip. Fracture lucency remains visible in the proximal left femur. 4.  Left pleural effusion cannot be excluded. Electronically Signed   By: Marcello Moores  Register M.D.   On: 08/28/2021 10:08    Microbiology: Results for orders placed or performed during the hospital encounter of 09/09/21  Resp Panel by RT-PCR (Flu A&B, Covid) Anterior Nasal Swab     Status: None   Collection Time: 09/09/21  2:38 PM   Specimen: Anterior Nasal Swab  Result Value Ref Range Status   SARS Coronavirus 2 by RT PCR NEGATIVE NEGATIVE Final    Comment: (NOTE) SARS-CoV-2 target nucleic acids are NOT DETECTED.  The SARS-CoV-2 RNA is generally detectable in upper  respiratory specimens during the acute phase of infection. The lowest concentration of SARS-CoV-2 viral copies this assay can detect is 138 copies/mL. A negative result does not preclude SARS-Cov-2 infection and should not be used as the sole basis for treatment or other patient management decisions. A negative result may occur with  improper specimen collection/handling, submission of specimen other than nasopharyngeal swab, presence of viral mutation(s) within the areas targeted by this assay, and inadequate number of viral copies(<138 copies/mL). A negative result must be combined with clinical observations, patient history, and epidemiological information. The expected result is Negative.  Fact Sheet for Patients:  EntrepreneurPulse.com.au  Fact Sheet for Healthcare Providers:  IncredibleEmployment.be  This test is no t yet approved or cleared by the [place] FDA and  has been authorized for detection and/or diagnosis of SARS-CoV-2 by FDA under an Emergency Use Authorization (EUA). This EUA will remain  in effect (meaning this test can be used) for the duration of the COVID-19 declaration under Section 564(b)(1) of the Act, 21 U.S.C.section 360bbb-3(b)(1), unless the authorization is terminated  or revoked sooner.       Influenza A by PCR NEGATIVE NEGATIVE Final   Influenza B by PCR NEGATIVE NEGATIVE Final    Comment: (NOTE) The Xpert Xpress SARS-CoV-2/FLU/RSV plus assay is intended as an aid in the diagnosis of influenza from Nasopharyngeal swab specimens and should not be used as a sole basis for treatment.  Nasal washings and aspirates are unacceptable for Xpert Xpress SARS-CoV-2/FLU/RSV testing.  Fact Sheet for Patients: EntrepreneurPulse.com.au  Fact Sheet for Healthcare Providers: IncredibleEmployment.be  This test is not yet approved or cleared by the [place] FDA and has been  authorized for detection and/or diagnosis of SARS-CoV-2 by FDA under an Emergency Use Authorization (EUA). This EUA will remain in effect (meaning this test can be used) for the duration of the COVID-19 declaration under Section 564(b)(1) of the Act, 21 U.S.C. section 360bbb-3(b)(1), unless the authorization is terminated or revoked.  Performed at [facility], [place] [address], [place], [place] [address]   Surgical pcr screen     Status: None   Collection Time: 09/10/21  4:40 PM   Specimen: Nasal Mucosa; Nasal Swab  Result Value Ref Range Status   MRSA, PCR NEGATIVE NEGATIVE Final   Staphylococcus aureus NEGATIVE NEGATIVE Final    Comment: (NOTE) The Xpert SA Assay (FDA approved for NASAL specimens in patients 87 years of age and older), is one component of a comprehensive surveillance program. It is not intended to diagnose infection nor to guide or monitor treatment. Performed at [facility], [place] [address], [place], [place] [address]    *Note: Due to a large number of results and/or encounters for the requested time period, some results have not been displayed. A complete set of results can be found in Results Review.    Labs: CBC: Recent Labs  Lab 09/11/21 0937 09/15/21 0458 09/15/21 2033  WBC 8.0 8.0  --   NEUTROABS 7.1  --   --   HGB 8.1* 7.0* 8.2*  HCT 27.1* 22.2* 26.0*  MCV 100.0 97.4  --   PLT 149* 130*  --    Basic Metabolic Panel: Recent Labs  Lab 09/11/21 0937 09/12/21 0601 09/15/21 0458  NA 140 140 139  K 3.9 3.8 3.0*  CL 100 97* 95*  CO2 32 30 35*  GLUCOSE 173* 125* 119*  BUN 92* 94* 69*  CREATININE 3.18* 3.03* 2.57*  CALCIUM 9.5 9.5 9.0   Liver Function Tests: Recent Labs  Lab 09/11/21 0937 09/12/21 0601  AST 55* 26  ALT 65* 42  ALKPHOS 84 74  BILITOT 0.6 0.7  PROT 6.4* 5.5*  ALBUMIN 3.7 3.3*   CBG: Recent Labs  Lab 09/10/21 1621 09/10/21 2022  GLUCAP 121* 128*    Discharge time spent: greater than 30  minutes.  Signed: Patrecia Pour, MD [facility] 09/17/2021

## 2021-09-17 NOTE — Progress Notes (Signed)
   Inpatient Rehabilitation Admissions Coordinator   Insurance has upheld denial on appeal for CIR admit. I met with patient and she is aware. Acute team and TOC made aware. We will sign off.  Danne Baxter, RN, MSN Rehab Admissions Coordinator [phone] 09/17/2021 3:12 PM

## 2021-09-17 NOTE — Progress Notes (Signed)
[name] to be D/C'd home with home health per MD order. Discussed with the patient and all questions fully answered.  Skin clean and dry with scattered bruising and abrasions. Back incision clean and intact..  IV catheter discontinued intact. Site without signs and symptoms of complications. Dressing and pressure applied.  An After Visit Summary was printed and given to the patient.   [name]  09/17/2021

## 2021-09-17 NOTE — TOC Transition Note (Signed)
Transition of Care [facility]) - CM/SW Discharge Note   Patient Details  Name: [name] MRN: [id number] Date of Birth: May 21, 1945  Transition of Care [facility]) CM/SW Contact:  Pollie Friar, RN Phone Number: 09/17/2021, 3:11 PM   Clinical Narrative:    Pt denied for CIR per [facility]. She has chosen to d/c home with home health services. CM has arranged home health with [facility]. Information on the AVS.  Pt has needed DME at home.  Pts family to provide transport home. She states they will bring oxygen for the transport home.   Final next level of care: Home w Home Health Services Barriers to Discharge: No Barriers Identified   Patient Goals and CMS Choice   CMS Medicare.gov Compare Post Acute Care list provided to:: Patient Choice offered to / list presented to : Patient  Discharge Placement                       Discharge Plan and Services   Discharge Planning Services: CM Consult                      HH Arranged: PT, OT [facility] Agency: [place] Date [place]: 09/17/21   Representative spoke with at [place]: [place] ([place]) Interventions     Readmission Risk Interventions    01/10/2021    9:47 AM  Readmission Risk Prevention Plan  Transportation Screening Complete  PCP or Specialist Appt within 3-5 Days Complete  HRI or [place] Complete  Social Work Consult for [place] Planning/Counseling Complete  Palliative Care Screening Not Applicable  Medication Review Press photographer) Complete

## 2021-09-17 NOTE — Progress Notes (Addendum)
Occupational Therapy Treatment Patient Details Name: [name] MRN: [id number] DOB: [birth year] Today's Date: 09/17/2021   History of present illness The pt is a 76 y/o female admitted 7/31 s/p fall sustaining unstable T9-10 fracture s/p T7-T11 posterior pedicle screw fixation.  PMHx: CAD s/p CABG, AICD, afib, CHF, HTN, IBA, OSA, stroke, DM2, , multiple Cardiac Caths, L IM nailing L hip, and angiograms.   OT comments  Patient supine in bed and agreeable to OT session.  Pt reports eager for CIR or dc home, just ready to get out of the hospital.  She completes bed mobility with supervision, transfers with min assist to min guard using RW given cueing for hand placement and posture.  EOB engaged in LB bathing/pericare hygiene and LB dressing, min assist for toileting and max assist for LB dressing.  Pt has reacher at home, verbally educated on use for LB dressing. Continue to recommend AIR at dc, to optimize strength, tolerance, safety and fall prevention during ADLs and mobility prior to dc home.  Discussed safety and recommendations if pt dc's home, pt agreeable to initial 24/7 support. Will follow acutely.     Recommendations for follow up therapy are one component of a multi-disciplinary discharge planning process, led by the attending physician.  Recommendations may be updated based on patient status, additional functional criteria and insurance authorization.    Follow Up Recommendations  Acute inpatient rehab (3hours/day)    Assistance Recommended at Discharge Frequent or constant Supervision/Assistance  Patient can return home with the following  Assistance with cooking/housework;A lot of help with bathing/dressing/bathroom;Assist for transportation;Help with stairs or ramp for entrance;A little help with walking and/or transfers   Equipment Recommendations  BSC/3in1    Recommendations for Other Services      Precautions / Restrictions Precautions Precautions:  Fall Restrictions Weight Bearing Restrictions: No       Mobility Bed Mobility Overal bed mobility: Needs Assistance Bed Mobility: Rolling, Sidelying to Sit Rolling: Supervision Sidelying to sit: Supervision       General bed mobility comments: no assist required, cueing for technique    Transfers Overall transfer level: Needs assistance Equipment used: Rolling walker (2 wheels) Transfers: Sit to/from Stand Sit to Stand: Min assist, Min guard           General transfer comment: min assist to min guard, cueing for hand placement and posture     Balance Overall balance assessment: Needs assistance Sitting-balance support: No upper extremity supported, Single extremity supported, Feet supported Sitting balance-Leahy Scale: Fair Sitting balance - Comments: limited dynamically'   Standing balance support: During functional activity, Bilateral upper extremity supported Standing balance-Leahy Scale: Poor Standing balance comment: reliant on external support                           ADL either performed or assessed with clinical judgement   ADL Overall ADL's : Needs assistance/impaired     Grooming: Set up;Sitting               Lower Body Dressing: Sit to/from stand;Moderate assistance Lower Body Dressing Details (indicate cue type and reason): assist with socks and underwear over feet, standing able to pull up over hips. Initated education with reacher verbally, will benefit from further instruction Toilet Transfer: Minimal assistance;Ambulation;Rolling walker (2 wheels) Toilet Transfer Details (indicate cue type and reason): simulated to recliner Toileting- Clothing Manipulation and Hygiene: Minimal assistance;Sit to/from stand Toileting - Clothing Manipulation Details (indicate cue type and  reason): for hygiene cleanliness     Functional mobility during ADLs: Minimal assistance;Rolling walker (2 wheels)      Extremity/Trunk Assessment               Vision       Perception     Praxis      Cognition Arousal/Alertness: Awake/alert Behavior During Therapy: WFL for tasks assessed/performed Overall Cognitive Status: Within Functional Limits for tasks assessed                                          Exercises      Shoulder Instructions       General Comments VSS on 3L O2    Pertinent Vitals/ Pain       Pain Assessment Pain Assessment: Faces Faces Pain Scale: Hurts even more Pain Location: sx thoracic incision Pain Descriptors / Indicators: Discomfort, Grimacing, Guarding, Headache Pain Intervention(s): Limited activity within patient's tolerance, Monitored during session, Repositioned  Home Living                                          Prior Functioning/Environment              Frequency  Min 2X/week        Progress Toward Goals  OT Goals(current goals can now be found in the care plan section)  Progress towards OT goals: Progressing toward goals  Acute Rehab OT Goals Patient Stated Goal: get better OT Goal Formulation: With patient Time For Goal Achievement: 09/26/21 Potential to Achieve Goals: Good  Plan Discharge plan remains appropriate;Frequency remains appropriate    Co-evaluation                 AM-PAC OT "6 Clicks" Daily Activity     Outcome Measure   Help from another person eating meals?: None Help from another person taking care of personal grooming?: A Little Help from another person toileting, which includes using toliet, bedpan, or urinal?: A Lot Help from another person bathing (including washing, rinsing, drying)?: A Lot Help from another person to put on and taking off regular upper body clothing?: A Little Help from another person to put on and taking off regular lower body clothing?: A Lot 6 Click Score: 16    End of Session Equipment Utilized During Treatment: Rolling walker (2 wheels)  OT Visit Diagnosis: Other  abnormalities of gait and mobility (R26.89);Muscle weakness (generalized) (M62.81);Pain Pain - part of body:  (back)   Activity Tolerance Patient tolerated treatment well   Patient Left in chair;with call bell/phone within reach;with chair alarm set   Nurse Communication Mobility status        Time: 0277-4128 OT Time Calculation (min): 22 min  Charges: OT General Charges $OT Visit: 1 Visit OT Treatments $Self Care/Home Management : 8-22 mins  Jolaine Artist, [place] Office [phone]   Delight Stare 09/17/2021, 9:55 AM

## 2021-09-17 NOTE — Progress Notes (Signed)
Physical Therapy Treatment Patient Details Name: [name] MRN: [id number] DOB: 11-06-1945 Today's Date: 09/17/2021   History of Present Illness The pt is a 76 y/o female admitted 7/31 s/p fall sustaining unstable T9-10 fracture s/p T7-T11 posterior pedicle screw fixation.  PMHx: CAD s/p CABG, AICD, afib, CHF, HTN, IBA, OSA, stroke, DM2, , multiple Cardiac Caths, L IM nailing L hip, and angiograms.    PT Comments    Continues to make good progress towards acute rehab goals. Gradually increasing ambulatory tolerance, still with intermittent min assist with gait, while using RW. 2 bouts of 55 feet ea tolerated today with single standing rest break ea and 1 seated rest period between sets on 3L supplemental O2 with minimal DOE. Patient will continue to benefit from skilled physical therapy services to further improve independence with functional mobility.    Recommendations for follow up therapy are one component of a multi-disciplinary discharge planning process, led by the attending physician.  Recommendations may be updated based on patient status, additional functional criteria and insurance authorization.  Follow Up Recommendations  Acute inpatient rehab (3hours/day) (HHPT and 24 hour care  if Rehab denies pt)     Assistance Recommended at Discharge Intermittent Supervision/Assistance  Patient can return home with the following A little help with walking and/or transfers;A little help with bathing/dressing/bathroom;Assistance with cooking/housework;Assist for transportation;Help with stairs or ramp for entrance   Equipment Recommendations  None recommended by PT    Recommendations for Other Services Rehab consult;OT consult     Precautions / Restrictions Precautions Precautions: Fall Restrictions Weight Bearing Restrictions: No     Mobility  Bed Mobility               General bed mobility comments: In recliner    Transfers Overall transfer level: Needs  assistance Equipment used: Rolling walker (2 wheels) Transfers: Sit to/from Stand Sit to Stand: Min assist, Min guard           General transfer comment: Initial stand required min assist for boost and balance, second trial min guard for safety. Cues for hand placement.    Ambulation/Gait Ambulation/Gait assistance: Min assist Gait Distance (Feet): 55 Feet (x2) Assistive device: Rolling walker (2 wheels) Gait Pattern/deviations: Decreased stride length, Shuffle, Wide base of support, Step-to pattern Gait velocity: slow Gait velocity interpretation: <1.31 ft/sec, indicative of household ambulator   General Gait Details: Cues for RW placement closer to proximity, increased step length, and min assist for RW with turns, occasionally for balance. Pt with good carry over on second bout showing recall of instructions for better gait mechanics and safety. Required one standing rest break ea bout, and seated break between sets.   Stairs             Wheelchair Mobility    Modified Rankin (Stroke Patients Only)       Balance Overall balance assessment: Needs assistance Sitting-balance support: No upper extremity supported, Single extremity supported, Feet supported Sitting balance-Leahy Scale: Fair Sitting balance - Comments: limited dynamically'   Standing balance support: During functional activity, Single extremity supported Standing balance-Leahy Scale: Poor Standing balance comment: reliant on external support                            Cognition Arousal/Alertness: Awake/alert Behavior During Therapy: WFL for tasks assessed/performed Overall Cognitive Status: Within Functional Limits for tasks assessed  General Comments: appears WFL, conitnue assessment        Exercises Other Exercises Other Exercises: States she has been consistent with LE exercises.    General Comments General comments (skin  integrity, edema, etc.): 3L supplemental O2 throughout session      Pertinent Vitals/Pain Pain Assessment Pain Assessment: Faces Faces Pain Scale: Hurts even more Pain Location: sx thoracic incision Pain Descriptors / Indicators: Discomfort, Guarding, Grimacing Pain Intervention(s): Limited activity within patient's tolerance, Monitored during session, Premedicated before session, Repositioned    Home Living                          Prior Function            PT Goals (current goals can now be found in the care plan section) Acute Rehab PT Goals PT Goal Formulation: With patient Time For Goal Achievement: 09/25/21 Potential to Achieve Goals: Good Progress towards PT goals: Progressing toward goals    Frequency    Min 4X/week      PT Plan Current plan remains appropriate    Co-evaluation              AM-PAC PT "6 Clicks" Mobility   Outcome Measure  Help needed turning from your back to your side while in a flat bed without using bedrails?: A Lot Help needed moving from lying on your back to sitting on the side of a flat bed without using bedrails?: A Lot Help needed moving to and from a bed to a chair (including a wheelchair)?: A Little Help needed standing up from a chair using your arms (e.g., wheelchair or bedside chair)?: A Little Help needed to walk in hospital room?: A Little Help needed climbing 3-5 steps with a railing? : A Lot 6 Click Score: 15    End of Session Equipment Utilized During Treatment: Gait belt;Oxygen Activity Tolerance: Patient tolerated treatment well Patient left: in chair;with call bell/phone within reach;with chair alarm set;with nursing/sitter in room Nurse Communication: Mobility status PT Visit Diagnosis: Other abnormalities of gait and mobility (R26.89);Pain Pain - part of body:  (back, headache)     Time: 2202-5427 PT Time Calculation (min) (ACUTE ONLY): 21 min  Charges:  $Gait Training: 8-22 mins                      Candie Mile, PT    Ellouise Newer 09/17/2021, 3:09 PM

## 2021-09-19 ENCOUNTER — Encounter (HOSPITAL_COMMUNITY): Payer: Medicare Other | Admitting: Internal Medicine

## 2021-09-19 ENCOUNTER — Ambulatory Visit: Payer: Medicare Other | Admitting: Physician Assistant

## 2021-09-19 NOTE — Progress Notes (Deleted)
09/19/2021 [name] [id number] [birth year]  Referring provider: Lujean Amel, MD Primary GI doctor: Dr. Silverio Decamp  ASSESSMENT AND PLAN:   Abdominal pain, epigastric Chronic history with CT abdomen pelvis February for recent hospitalization without intra-abdominal pathology, some vascular mesenteric ultrasound less than 70% SMA. Continue PPI BID, carafate, and add back on lidocaine patches ? Part of constipation with being on norco?  Possible component of gastroparesis with narcotic use.,  Possible muscular component since also was in her back and patient does have known arthritis. Small frequent meals.  Constipation, unspecified constipation type Increase Linzess to 290 since patient continues on hydrocodone with decreased movement. Continue to increase movement, monitor fluids. Some fecal incontinence but urinary incontinence as well, possible pelvic floor issues contributing. Add on fiber to help prevent fecal incontinence. We will try to get KUB at rehab facility  Irritable bowel syndrome with constipation Increase Linzess to 290  Morbid obesity with body mass index (BMI) greater than or equal to 50 (HCC) Continue with possible weight loss.   History of Present Illness:  76 y.o. female with a past medical history of hypertension, morbid obesity, diabetes, hyperlipidemia, OSA, CKD stage III, CAD status post CABG 2016, CVA secondary to high-grade ICA stenosis status post stent August 2022, CHF status post AICD EF 45 to 50% mildly elevated pulmonary artery pressures, COPD with chronic oxygen use, chronic antiplatelet therapy with Plavix and aspirin. and others listed below, presents for hospital follow up.   Patient was in the hospital s/p ORIF for closed left hip fracture s/p fall February 23, complicated by acute renal failure, with complaints of worsening abdominal pain. CT abdomen pelvis without contrast that was negative for any acute intra-abdominal  pathology Vascular mesenteric ultrasound  with less than 70% stenosis of SMA Last seen in the office increase Linzess to 290, added on lidocaine patches, given gastroparesis diet.  Since last seen patient had a fall with a T9 vertebral fracture status post T7-T11 pedicle screw placement 08/01.  Patient had postoperative blood loss anemia and pain status post 1 packed red blood cell unit. CKD stage IV following with nephrology.  Patient discharged on 80 mg Lasix twice daily.  Working closely with cardiology and nephrology   Previous GI work up: 12/05/2020 barium swallow showed stricture in distal esophagus similar to comparison exam however barium tablet passed GE junction, findings suggestive of reflux induced stricture of the distal esophagus.  No mucosal irregularities.   Barium esophagram November 04, 2019: Mild press by esophagus with good primary peristaltic wave.  Smooth narrowing of distal esophagus preventing passage of 13 mm tablet   EGD and colonoscopy in November 2019.  EGD revealed grade B esophagitis, small hiatal hernia, gastritis, and a small nodule in the stomach.  Gastric biopsies showed no H. pylori.  Showed reactive gastropathy with ulceration.  Colonoscopy revealed polyps that were removed and were tubular adenomas.  Also had diverticulosis and internal/external hemorrhoids.   EGD December 14, 2006 showed mild antral gastritis otherwise normal exam.  H. pylori CLOtest negative.   Colonoscopy September 27, 2002 by Dr. Olevia Perches with removal of small diminutive hyperplastic polyps and left-sided diverticulosis  Current Medications:   Current Outpatient Medications (Endocrine & Metabolic):    predniSONE (DELTASONE) 10 MG tablet, Take 1 tablet (10 mg total) by mouth daily with breakfast.  Current Outpatient Medications (Cardiovascular):    EPINEPHrine 0.3 mg/0.3 mL IJ SOAJ injection, Inject 0.3 mg into the muscle once as needed for anaphylaxis (severe allergic reaction).  furosemide (LASIX) 80 MG tablet, Take 1 tablet (80 mg total) by mouth 2 (two) times daily.   hydrALAZINE (APRESOLINE) 25 MG tablet, Take 1 tablet (25 mg total) by mouth every 8 (eight) hours.   isosorbide mononitrate (IMDUR) 30 MG 24 hr tablet, TAKE 3 TABLETS BY MOUTH  DAILY (Patient taking differently: Take 90 mg by mouth every morning.)   metoprolol succinate (TOPROL-XL) 50 MG 24 hr tablet, Take 1 tablet (50 mg total) by mouth daily. Take with or immediately following a meal.   nitroGLYCERIN (NITROSTAT) 0.4 MG SL tablet, Place 1 tablet (0.4 mg total) under the tongue every 5 (five) minutes as needed for chest pain.   rosuvastatin (CRESTOR) 20 MG tablet, Take 1 tablet (20 mg total) by mouth every evening. NEED OV.  Current Outpatient Medications (Respiratory):    albuterol (PROVENTIL HFA;VENTOLIN HFA) 108 (90 Base) MCG/ACT inhaler, Inhale 2 puffs into the lungs every 6 (six) hours as needed for wheezing or shortness of breath.   albuterol (PROVENTIL) (2.5 MG/3ML) 0.083% nebulizer solution, Take 3 mLs (2.5 mg total) by nebulization every 6 (six) hours as needed for wheezing or shortness of breath.   budesonide-formoterol (SYMBICORT) 160-4.5 MCG/ACT inhaler, Inhale 2 puffs into the lungs 2 (two) times daily.   oxymetazoline (AFRIN) 0.05 % nasal spray, Place 1 spray into both nostrils 2 (two) times daily as needed for congestion.  Current Outpatient Medications (Analgesics):    acetaminophen (TYLENOL) 500 MG tablet, Take 1,000 mg by mouth 3 (three) times daily as needed (for pain or headaches).   allopurinol (ZYLOPRIM) 100 MG tablet, Take 1 tablet (100 mg total) by mouth daily. (Patient taking differently: Take 100 mg by mouth every morning.)   aspirin EC 81 MG tablet, Take 1 tablet (81 mg total) by mouth daily. (Patient taking differently: Take 81 mg by mouth every morning.)   oxyCODONE (OXY IR/ROXICODONE) 5 MG immediate release tablet, Take 1-2 tablets (5-10 mg total) by mouth every 6 (six) hours as  needed for up to 5 days for moderate pain or severe pain.   traMADol (ULTRAM) 50 MG tablet, Take 50 mg by mouth daily as needed (pain).  Current Outpatient Medications (Hematological):    clopidogrel (PLAVIX) 75 MG tablet, Take 1 tablet (75 mg total) by mouth daily. (Patient taking differently: Take 75 mg by mouth every morning.)   vitamin B-12 (CYANOCOBALAMIN) 1000 MCG tablet, Take 1 tablet (1,000 mcg total) by mouth daily.  Current Outpatient Medications (Other):    ACCU-CHEK AVIVA PLUS test strip, Check blood sugar twice daily   Accu-Chek Softclix Lancets lancets, Check blood sugar twice daily   Alcohol Swabs (B-D SINGLE USE SWABS REGULAR) PADS,    cycloSPORINE (RESTASIS) 0.05 % ophthalmic emulsion, Place 1 drop into both eyes 2 (two) times daily.   hydrOXYzine (ATARAX) 25 MG tablet, Take 25 mg by mouth 2 (two) times daily as needed for itching.   Lidocaine 0.5 % AERO, Place 1 spray rectally every 6 (six) hours as needed (hemorrhoids). Preparation H rapid relief lidocaine spray   linaclotide (LINZESS) 145 MCG CAPS capsule, Take 145 mcg by mouth daily before breakfast. (Patient not taking: Reported on 09/02/2021)   ondansetron (ZOFRAN) 4 MG tablet, Take 4 mg by mouth daily as needed for nausea or vomiting.   OXYGEN, Inhale 2-3 L/min into the lungs continuous.   pantoprazole (PROTONIX) 40 MG tablet, Take 1 tablet (40 mg total) by mouth 2 (two) times daily.   polyethylene glycol (MIRALAX / GLYCOLAX) 17 g packet, Take  17 g by mouth daily as needed (constipation).   Potassium Chloride ER 20 MEQ TBCR, Take 20 mEq by mouth daily in the afternoon.   sucralfate (CARAFATE) 1 g tablet, Take 1 tablet (1 g total) by mouth 4 (four) times daily -  with meals and at bedtime. (Patient taking differently: Take 2 g by mouth 2 (two) times daily.)  Surgical History:  She  has a past surgical history that includes Appendectomy; Tubal ligation; Coronary angiogram (2010); ABDOMINAL ULTRASOUND (12/01/2011); LEXISCAN  MYOVIEW (11/14/2011); transthoracic echocardiogram (07/23/2012); left heart catheterization with coronary angiogram (N/A, 07/23/2012); Biv pacemaker generator change out (N/A, 11/02/2012); Coronary artery bypass graft (N/A, 11/29/2014); TEE without cardioversion (N/A, 11/29/2014); RIGHT/LEFT HEART CATH AND CORONARY ANGIOGRAPHY (N/A, 10/09/2016); Knee arthroscopy (Bilateral); Fracture surgery; Ankle fracture surgery (Right); Anterior cervical decomp/discectomy fusion; Back surgery; Cardiac catheterization (05/17/1999); Cardiac catheterization (07/08/2002); Cardiac catheterization (Bilateral, 04/26/2007); Cardiac catheterization (02/18/2008); Cardiac catheterization (07/23/2012); Cardiac catheterization (N/A, 11/24/2014); Cardiac catheterization (11/27/2014); Cardiac catheterization (10/09/2016); Insert / replace / remove pacemaker (1999); BIV ICD INSERTION CRT-D (2001?); CORONARY STENT INTERVENTION (N/A, 10/31/2016); LEFT HEART CATH AND CORS/GRAFTS ANGIOGRAPHY (N/A, 12/09/2017); Colonoscopy with propofol (N/A, 12/29/2017); Esophagogastroduodenoscopy (egd) with propofol (N/A, 12/29/2017); biopsy (12/29/2017); polypectomy (12/29/2017); Cholecystectomy (N/A, 04/09/2018); RIGHT/LEFT HEART CATH AND CORONARY/GRAFT ANGIOGRAPHY (N/A, 03/11/2019); Transcarotid artery revascularization (Right, 10/01/2020); Ultrasound guidance for vascular access (Left, 10/01/2020); PPM GENERATOR CHANGEOUT (N/A, 12/31/2020); Intramedullary (im) nail intertrochanteric (Left, 04/04/2021); IR US Guide Vasc Access Left (04/11/2021); IR Fluoro Guide CV Line Left (04/11/2021); and Lumbar percutaneous pedicle screw 4 level (N/A, 09/10/2021). Family History:  Her family history includes Asthma in her sister; Breast cancer in her mother; Diabetes in her maternal grandmother and mother; Glaucoma in her maternal aunt; Heart disease in her maternal aunt and maternal grandmother; Kidney disease in her maternal grandmother. Social History:   reports that she quit smoking about  53 years ago. Her smoking use included cigarettes. She has a 0.75 pack-year smoking history. She has never used smokeless tobacco. She reports that she does not drink alcohol and does not use drugs.  Current Medications, Allergies, Past Medical History, Past Surgical History, Family History and Social History were reviewed in [employer] record.  Physical Exam: There were no vitals taken for this visit. General:   Morbidly obese female, chronically ill-appearing in wheelchair Heart:  Regular rate and rhythm Pulm: Patient with 3 L [place], diffusely decreased breath sounds. Abdomen:  Soft, Obese AB, Sluggish bowel sounds. mild tenderness in the epigastrium. Without guarding and Without rebound, No organomegaly appreciated. Extremities:  with  edema. Neurologic:  Alert and  oriented x4;  No focal deficits. Gait not assessed, in wheelchair  Psych:  Cooperative. Normal mood and affect.   Vladimir Crofts, PA-C 09/19/21

## 2021-09-20 ENCOUNTER — Ambulatory Visit: Payer: Medicare Other | Admitting: Physician Assistant

## 2021-09-20 DIAGNOSIS — I2510 Atherosclerotic heart disease of native coronary artery without angina pectoris: Secondary | ICD-10-CM | POA: Diagnosis not present

## 2021-09-20 DIAGNOSIS — I5043 Acute on chronic combined systolic (congestive) and diastolic (congestive) heart failure: Secondary | ICD-10-CM | POA: Diagnosis not present

## 2021-09-20 DIAGNOSIS — I480 Paroxysmal atrial fibrillation: Secondary | ICD-10-CM | POA: Diagnosis not present

## 2021-09-20 DIAGNOSIS — M545 Low back pain, unspecified: Secondary | ICD-10-CM | POA: Diagnosis not present

## 2021-09-20 DIAGNOSIS — E1122 Type 2 diabetes mellitus with diabetic chronic kidney disease: Secondary | ICD-10-CM | POA: Diagnosis not present

## 2021-09-20 DIAGNOSIS — E7800 Pure hypercholesterolemia, unspecified: Secondary | ICD-10-CM | POA: Diagnosis not present

## 2021-09-20 DIAGNOSIS — J9621 Acute and chronic respiratory failure with hypoxia: Secondary | ICD-10-CM | POA: Diagnosis not present

## 2021-09-20 DIAGNOSIS — I132 Hypertensive heart and chronic kidney disease with heart failure and with stage 5 chronic kidney disease, or end stage renal disease: Secondary | ICD-10-CM | POA: Diagnosis not present

## 2021-09-20 DIAGNOSIS — I959 Hypotension, unspecified: Secondary | ICD-10-CM | POA: Diagnosis not present

## 2021-09-20 DIAGNOSIS — K219 Gastro-esophageal reflux disease without esophagitis: Secondary | ICD-10-CM | POA: Diagnosis not present

## 2021-09-20 DIAGNOSIS — B3731 Acute candidiasis of vulva and vagina: Secondary | ICD-10-CM | POA: Diagnosis not present

## 2021-09-20 DIAGNOSIS — N186 End stage renal disease: Secondary | ICD-10-CM | POA: Diagnosis not present

## 2021-09-20 DIAGNOSIS — I442 Atrioventricular block, complete: Secondary | ICD-10-CM | POA: Diagnosis not present

## 2021-09-20 DIAGNOSIS — M1990 Unspecified osteoarthritis, unspecified site: Secondary | ICD-10-CM | POA: Diagnosis not present

## 2021-09-20 DIAGNOSIS — D696 Thrombocytopenia, unspecified: Secondary | ICD-10-CM | POA: Diagnosis not present

## 2021-09-20 DIAGNOSIS — K760 Fatty (change of) liver, not elsewhere classified: Secondary | ICD-10-CM | POA: Diagnosis not present

## 2021-09-20 DIAGNOSIS — M109 Gout, unspecified: Secondary | ICD-10-CM | POA: Diagnosis not present

## 2021-09-20 DIAGNOSIS — I428 Other cardiomyopathies: Secondary | ICD-10-CM | POA: Diagnosis not present

## 2021-09-20 DIAGNOSIS — K589 Irritable bowel syndrome without diarrhea: Secondary | ICD-10-CM | POA: Diagnosis not present

## 2021-09-20 DIAGNOSIS — D519 Vitamin B12 deficiency anemia, unspecified: Secondary | ICD-10-CM | POA: Diagnosis not present

## 2021-09-20 DIAGNOSIS — G4733 Obstructive sleep apnea (adult) (pediatric): Secondary | ICD-10-CM | POA: Diagnosis not present

## 2021-09-20 DIAGNOSIS — J45909 Unspecified asthma, uncomplicated: Secondary | ICD-10-CM | POA: Diagnosis not present

## 2021-09-20 DIAGNOSIS — G8929 Other chronic pain: Secondary | ICD-10-CM | POA: Diagnosis not present

## 2021-09-23 DIAGNOSIS — K219 Gastro-esophageal reflux disease without esophagitis: Secondary | ICD-10-CM | POA: Diagnosis not present

## 2021-09-23 DIAGNOSIS — I959 Hypotension, unspecified: Secondary | ICD-10-CM | POA: Diagnosis not present

## 2021-09-23 DIAGNOSIS — K760 Fatty (change of) liver, not elsewhere classified: Secondary | ICD-10-CM | POA: Diagnosis not present

## 2021-09-23 DIAGNOSIS — M109 Gout, unspecified: Secondary | ICD-10-CM | POA: Diagnosis not present

## 2021-09-23 DIAGNOSIS — E7800 Pure hypercholesterolemia, unspecified: Secondary | ICD-10-CM | POA: Diagnosis not present

## 2021-09-23 DIAGNOSIS — M1990 Unspecified osteoarthritis, unspecified site: Secondary | ICD-10-CM | POA: Diagnosis not present

## 2021-09-23 DIAGNOSIS — I5043 Acute on chronic combined systolic (congestive) and diastolic (congestive) heart failure: Secondary | ICD-10-CM | POA: Diagnosis not present

## 2021-09-23 DIAGNOSIS — I442 Atrioventricular block, complete: Secondary | ICD-10-CM | POA: Diagnosis not present

## 2021-09-23 DIAGNOSIS — G8929 Other chronic pain: Secondary | ICD-10-CM | POA: Diagnosis not present

## 2021-09-23 DIAGNOSIS — D519 Vitamin B12 deficiency anemia, unspecified: Secondary | ICD-10-CM | POA: Diagnosis not present

## 2021-09-23 DIAGNOSIS — M545 Low back pain, unspecified: Secondary | ICD-10-CM | POA: Diagnosis not present

## 2021-09-23 DIAGNOSIS — E1122 Type 2 diabetes mellitus with diabetic chronic kidney disease: Secondary | ICD-10-CM | POA: Diagnosis not present

## 2021-09-23 DIAGNOSIS — I480 Paroxysmal atrial fibrillation: Secondary | ICD-10-CM | POA: Diagnosis not present

## 2021-09-23 DIAGNOSIS — I2510 Atherosclerotic heart disease of native coronary artery without angina pectoris: Secondary | ICD-10-CM | POA: Diagnosis not present

## 2021-09-23 DIAGNOSIS — J9621 Acute and chronic respiratory failure with hypoxia: Secondary | ICD-10-CM | POA: Diagnosis not present

## 2021-09-23 DIAGNOSIS — N186 End stage renal disease: Secondary | ICD-10-CM | POA: Diagnosis not present

## 2021-09-23 DIAGNOSIS — J45909 Unspecified asthma, uncomplicated: Secondary | ICD-10-CM | POA: Diagnosis not present

## 2021-09-23 DIAGNOSIS — K589 Irritable bowel syndrome without diarrhea: Secondary | ICD-10-CM | POA: Diagnosis not present

## 2021-09-23 DIAGNOSIS — I428 Other cardiomyopathies: Secondary | ICD-10-CM | POA: Diagnosis not present

## 2021-09-23 DIAGNOSIS — D696 Thrombocytopenia, unspecified: Secondary | ICD-10-CM | POA: Diagnosis not present

## 2021-09-23 DIAGNOSIS — I132 Hypertensive heart and chronic kidney disease with heart failure and with stage 5 chronic kidney disease, or end stage renal disease: Secondary | ICD-10-CM | POA: Diagnosis not present

## 2021-09-23 DIAGNOSIS — G4733 Obstructive sleep apnea (adult) (pediatric): Secondary | ICD-10-CM | POA: Diagnosis not present

## 2021-09-23 DIAGNOSIS — B3731 Acute candidiasis of vulva and vagina: Secondary | ICD-10-CM | POA: Diagnosis not present

## 2021-09-23 NOTE — Telephone Encounter (Signed)
error 

## 2021-09-24 DIAGNOSIS — K219 Gastro-esophageal reflux disease without esophagitis: Secondary | ICD-10-CM | POA: Diagnosis not present

## 2021-09-24 DIAGNOSIS — D519 Vitamin B12 deficiency anemia, unspecified: Secondary | ICD-10-CM | POA: Diagnosis not present

## 2021-09-24 DIAGNOSIS — J45909 Unspecified asthma, uncomplicated: Secondary | ICD-10-CM | POA: Diagnosis not present

## 2021-09-24 DIAGNOSIS — J9621 Acute and chronic respiratory failure with hypoxia: Secondary | ICD-10-CM | POA: Diagnosis not present

## 2021-09-24 DIAGNOSIS — I959 Hypotension, unspecified: Secondary | ICD-10-CM | POA: Diagnosis not present

## 2021-09-24 DIAGNOSIS — I132 Hypertensive heart and chronic kidney disease with heart failure and with stage 5 chronic kidney disease, or end stage renal disease: Secondary | ICD-10-CM | POA: Diagnosis not present

## 2021-09-24 DIAGNOSIS — I2510 Atherosclerotic heart disease of native coronary artery without angina pectoris: Secondary | ICD-10-CM | POA: Diagnosis not present

## 2021-09-24 DIAGNOSIS — G8929 Other chronic pain: Secondary | ICD-10-CM | POA: Diagnosis not present

## 2021-09-24 DIAGNOSIS — M1990 Unspecified osteoarthritis, unspecified site: Secondary | ICD-10-CM | POA: Diagnosis not present

## 2021-09-24 DIAGNOSIS — K589 Irritable bowel syndrome without diarrhea: Secondary | ICD-10-CM | POA: Diagnosis not present

## 2021-09-24 DIAGNOSIS — I5043 Acute on chronic combined systolic (congestive) and diastolic (congestive) heart failure: Secondary | ICD-10-CM | POA: Diagnosis not present

## 2021-09-24 DIAGNOSIS — M109 Gout, unspecified: Secondary | ICD-10-CM | POA: Diagnosis not present

## 2021-09-24 DIAGNOSIS — N186 End stage renal disease: Secondary | ICD-10-CM | POA: Diagnosis not present

## 2021-09-24 DIAGNOSIS — G4733 Obstructive sleep apnea (adult) (pediatric): Secondary | ICD-10-CM | POA: Diagnosis not present

## 2021-09-24 DIAGNOSIS — D696 Thrombocytopenia, unspecified: Secondary | ICD-10-CM | POA: Diagnosis not present

## 2021-09-24 DIAGNOSIS — I480 Paroxysmal atrial fibrillation: Secondary | ICD-10-CM | POA: Diagnosis not present

## 2021-09-24 DIAGNOSIS — E1122 Type 2 diabetes mellitus with diabetic chronic kidney disease: Secondary | ICD-10-CM | POA: Diagnosis not present

## 2021-09-24 DIAGNOSIS — K760 Fatty (change of) liver, not elsewhere classified: Secondary | ICD-10-CM | POA: Diagnosis not present

## 2021-09-24 DIAGNOSIS — E7800 Pure hypercholesterolemia, unspecified: Secondary | ICD-10-CM | POA: Diagnosis not present

## 2021-09-24 DIAGNOSIS — B3731 Acute candidiasis of vulva and vagina: Secondary | ICD-10-CM | POA: Diagnosis not present

## 2021-09-24 DIAGNOSIS — I442 Atrioventricular block, complete: Secondary | ICD-10-CM | POA: Diagnosis not present

## 2021-09-24 DIAGNOSIS — M545 Low back pain, unspecified: Secondary | ICD-10-CM | POA: Diagnosis not present

## 2021-09-24 DIAGNOSIS — I428 Other cardiomyopathies: Secondary | ICD-10-CM | POA: Diagnosis not present

## 2021-09-27 DIAGNOSIS — M1990 Unspecified osteoarthritis, unspecified site: Secondary | ICD-10-CM | POA: Diagnosis not present

## 2021-09-27 DIAGNOSIS — N186 End stage renal disease: Secondary | ICD-10-CM | POA: Diagnosis not present

## 2021-09-27 DIAGNOSIS — D519 Vitamin B12 deficiency anemia, unspecified: Secondary | ICD-10-CM | POA: Diagnosis not present

## 2021-09-27 DIAGNOSIS — E7800 Pure hypercholesterolemia, unspecified: Secondary | ICD-10-CM | POA: Diagnosis not present

## 2021-09-27 DIAGNOSIS — I132 Hypertensive heart and chronic kidney disease with heart failure and with stage 5 chronic kidney disease, or end stage renal disease: Secondary | ICD-10-CM | POA: Diagnosis not present

## 2021-09-27 DIAGNOSIS — I2510 Atherosclerotic heart disease of native coronary artery without angina pectoris: Secondary | ICD-10-CM | POA: Diagnosis not present

## 2021-09-27 DIAGNOSIS — M109 Gout, unspecified: Secondary | ICD-10-CM | POA: Diagnosis not present

## 2021-09-27 DIAGNOSIS — I442 Atrioventricular block, complete: Secondary | ICD-10-CM | POA: Diagnosis not present

## 2021-09-27 DIAGNOSIS — I428 Other cardiomyopathies: Secondary | ICD-10-CM | POA: Diagnosis not present

## 2021-09-27 DIAGNOSIS — G4733 Obstructive sleep apnea (adult) (pediatric): Secondary | ICD-10-CM | POA: Diagnosis not present

## 2021-09-27 DIAGNOSIS — B3731 Acute candidiasis of vulva and vagina: Secondary | ICD-10-CM | POA: Diagnosis not present

## 2021-09-27 DIAGNOSIS — J9621 Acute and chronic respiratory failure with hypoxia: Secondary | ICD-10-CM | POA: Diagnosis not present

## 2021-09-27 DIAGNOSIS — J45909 Unspecified asthma, uncomplicated: Secondary | ICD-10-CM | POA: Diagnosis not present

## 2021-09-27 DIAGNOSIS — M545 Low back pain, unspecified: Secondary | ICD-10-CM | POA: Diagnosis not present

## 2021-09-27 DIAGNOSIS — D696 Thrombocytopenia, unspecified: Secondary | ICD-10-CM | POA: Diagnosis not present

## 2021-09-27 DIAGNOSIS — G8929 Other chronic pain: Secondary | ICD-10-CM | POA: Diagnosis not present

## 2021-09-27 DIAGNOSIS — K589 Irritable bowel syndrome without diarrhea: Secondary | ICD-10-CM | POA: Diagnosis not present

## 2021-09-27 DIAGNOSIS — I959 Hypotension, unspecified: Secondary | ICD-10-CM | POA: Diagnosis not present

## 2021-09-27 DIAGNOSIS — I480 Paroxysmal atrial fibrillation: Secondary | ICD-10-CM | POA: Diagnosis not present

## 2021-09-27 DIAGNOSIS — K219 Gastro-esophageal reflux disease without esophagitis: Secondary | ICD-10-CM | POA: Diagnosis not present

## 2021-09-27 DIAGNOSIS — I5043 Acute on chronic combined systolic (congestive) and diastolic (congestive) heart failure: Secondary | ICD-10-CM | POA: Diagnosis not present

## 2021-09-27 DIAGNOSIS — K760 Fatty (change of) liver, not elsewhere classified: Secondary | ICD-10-CM | POA: Diagnosis not present

## 2021-09-27 DIAGNOSIS — E1122 Type 2 diabetes mellitus with diabetic chronic kidney disease: Secondary | ICD-10-CM | POA: Diagnosis not present

## 2021-09-30 DIAGNOSIS — I428 Other cardiomyopathies: Secondary | ICD-10-CM | POA: Diagnosis not present

## 2021-09-30 DIAGNOSIS — I2510 Atherosclerotic heart disease of native coronary artery without angina pectoris: Secondary | ICD-10-CM | POA: Diagnosis not present

## 2021-09-30 DIAGNOSIS — K589 Irritable bowel syndrome without diarrhea: Secondary | ICD-10-CM | POA: Diagnosis not present

## 2021-09-30 DIAGNOSIS — E7800 Pure hypercholesterolemia, unspecified: Secondary | ICD-10-CM | POA: Diagnosis not present

## 2021-09-30 DIAGNOSIS — I442 Atrioventricular block, complete: Secondary | ICD-10-CM | POA: Diagnosis not present

## 2021-09-30 DIAGNOSIS — D519 Vitamin B12 deficiency anemia, unspecified: Secondary | ICD-10-CM | POA: Diagnosis not present

## 2021-09-30 DIAGNOSIS — G4733 Obstructive sleep apnea (adult) (pediatric): Secondary | ICD-10-CM | POA: Diagnosis not present

## 2021-09-30 DIAGNOSIS — I480 Paroxysmal atrial fibrillation: Secondary | ICD-10-CM | POA: Diagnosis not present

## 2021-09-30 DIAGNOSIS — M545 Low back pain, unspecified: Secondary | ICD-10-CM | POA: Diagnosis not present

## 2021-09-30 DIAGNOSIS — E1122 Type 2 diabetes mellitus with diabetic chronic kidney disease: Secondary | ICD-10-CM | POA: Diagnosis not present

## 2021-09-30 DIAGNOSIS — I959 Hypotension, unspecified: Secondary | ICD-10-CM | POA: Diagnosis not present

## 2021-09-30 DIAGNOSIS — J45909 Unspecified asthma, uncomplicated: Secondary | ICD-10-CM | POA: Diagnosis not present

## 2021-09-30 DIAGNOSIS — G8929 Other chronic pain: Secondary | ICD-10-CM | POA: Diagnosis not present

## 2021-09-30 DIAGNOSIS — B3731 Acute candidiasis of vulva and vagina: Secondary | ICD-10-CM | POA: Diagnosis not present

## 2021-09-30 DIAGNOSIS — M109 Gout, unspecified: Secondary | ICD-10-CM | POA: Diagnosis not present

## 2021-09-30 DIAGNOSIS — D696 Thrombocytopenia, unspecified: Secondary | ICD-10-CM | POA: Diagnosis not present

## 2021-09-30 DIAGNOSIS — M1990 Unspecified osteoarthritis, unspecified site: Secondary | ICD-10-CM | POA: Diagnosis not present

## 2021-09-30 DIAGNOSIS — J9621 Acute and chronic respiratory failure with hypoxia: Secondary | ICD-10-CM | POA: Diagnosis not present

## 2021-09-30 DIAGNOSIS — K760 Fatty (change of) liver, not elsewhere classified: Secondary | ICD-10-CM | POA: Diagnosis not present

## 2021-09-30 DIAGNOSIS — I5043 Acute on chronic combined systolic (congestive) and diastolic (congestive) heart failure: Secondary | ICD-10-CM | POA: Diagnosis not present

## 2021-09-30 DIAGNOSIS — I132 Hypertensive heart and chronic kidney disease with heart failure and with stage 5 chronic kidney disease, or end stage renal disease: Secondary | ICD-10-CM | POA: Diagnosis not present

## 2021-09-30 DIAGNOSIS — K219 Gastro-esophageal reflux disease without esophagitis: Secondary | ICD-10-CM | POA: Diagnosis not present

## 2021-09-30 DIAGNOSIS — N186 End stage renal disease: Secondary | ICD-10-CM | POA: Diagnosis not present

## 2021-10-01 ENCOUNTER — Ambulatory Visit (INDEPENDENT_AMBULATORY_CARE_PROVIDER_SITE_OTHER): Payer: Medicare Other

## 2021-10-01 DIAGNOSIS — I442 Atrioventricular block, complete: Secondary | ICD-10-CM

## 2021-10-01 LAB — CUP PACEART REMOTE DEVICE CHECK
Battery Remaining Longevity: 102 mo
Battery Remaining Percentage: 100 %
Brady Statistic RA Percent Paced: 0 %
Brady Statistic RV Percent Paced: 96 %
Date Time Interrogation Session: 20230822013700
Implantable Lead Implant Date: 20080923
Implantable Lead Implant Date: 20080923
Implantable Lead Implant Date: 20080923
Implantable Lead Location: 753858
Implantable Lead Location: 753859
Implantable Lead Location: 753860
Implantable Lead Model: 4285
Implantable Lead Model: 4555
Implantable Lead Model: 7120
Implantable Lead Serial Number: 170220
Implantable Lead Serial Number: 486468
Implantable Pulse Generator Implant Date: 20221121
Lead Channel Impedance Value: 1220 Ohm
Lead Channel Impedance Value: 626 Ohm
Lead Channel Impedance Value: 959 Ohm
Lead Channel Pacing Threshold Amplitude: 0.7 V
Lead Channel Pacing Threshold Amplitude: 1.7 V
Lead Channel Pacing Threshold Pulse Width: 0.4 ms
Lead Channel Pacing Threshold Pulse Width: 1 ms
Lead Channel Setting Pacing Amplitude: 2 V
Lead Channel Setting Pacing Amplitude: 3.5 V
Lead Channel Setting Pacing Pulse Width: 0.4 ms
Lead Channel Setting Pacing Pulse Width: 1 ms
Lead Channel Setting Sensing Sensitivity: 3 mV
Lead Channel Setting Sensing Sensitivity: 3 mV
Pulse Gen Serial Number: 720887

## 2021-10-02 ENCOUNTER — Ambulatory Visit (HOSPITAL_BASED_OUTPATIENT_CLINIC_OR_DEPARTMENT_OTHER): Payer: Medicare Other | Admitting: Orthopaedic Surgery

## 2021-10-03 DIAGNOSIS — N186 End stage renal disease: Secondary | ICD-10-CM | POA: Diagnosis not present

## 2021-10-03 DIAGNOSIS — E1122 Type 2 diabetes mellitus with diabetic chronic kidney disease: Secondary | ICD-10-CM | POA: Diagnosis not present

## 2021-10-03 DIAGNOSIS — I442 Atrioventricular block, complete: Secondary | ICD-10-CM | POA: Diagnosis not present

## 2021-10-03 DIAGNOSIS — K219 Gastro-esophageal reflux disease without esophagitis: Secondary | ICD-10-CM | POA: Diagnosis not present

## 2021-10-03 DIAGNOSIS — I959 Hypotension, unspecified: Secondary | ICD-10-CM | POA: Diagnosis not present

## 2021-10-03 DIAGNOSIS — K589 Irritable bowel syndrome without diarrhea: Secondary | ICD-10-CM | POA: Diagnosis not present

## 2021-10-03 DIAGNOSIS — M1990 Unspecified osteoarthritis, unspecified site: Secondary | ICD-10-CM | POA: Diagnosis not present

## 2021-10-03 DIAGNOSIS — D696 Thrombocytopenia, unspecified: Secondary | ICD-10-CM | POA: Diagnosis not present

## 2021-10-03 DIAGNOSIS — M109 Gout, unspecified: Secondary | ICD-10-CM | POA: Diagnosis not present

## 2021-10-03 DIAGNOSIS — J9621 Acute and chronic respiratory failure with hypoxia: Secondary | ICD-10-CM | POA: Diagnosis not present

## 2021-10-03 DIAGNOSIS — K760 Fatty (change of) liver, not elsewhere classified: Secondary | ICD-10-CM | POA: Diagnosis not present

## 2021-10-03 DIAGNOSIS — D519 Vitamin B12 deficiency anemia, unspecified: Secondary | ICD-10-CM | POA: Diagnosis not present

## 2021-10-03 DIAGNOSIS — G4733 Obstructive sleep apnea (adult) (pediatric): Secondary | ICD-10-CM | POA: Diagnosis not present

## 2021-10-03 DIAGNOSIS — B3731 Acute candidiasis of vulva and vagina: Secondary | ICD-10-CM | POA: Diagnosis not present

## 2021-10-03 DIAGNOSIS — E7800 Pure hypercholesterolemia, unspecified: Secondary | ICD-10-CM | POA: Diagnosis not present

## 2021-10-03 DIAGNOSIS — M545 Low back pain, unspecified: Secondary | ICD-10-CM | POA: Diagnosis not present

## 2021-10-03 DIAGNOSIS — G8929 Other chronic pain: Secondary | ICD-10-CM | POA: Diagnosis not present

## 2021-10-03 DIAGNOSIS — I132 Hypertensive heart and chronic kidney disease with heart failure and with stage 5 chronic kidney disease, or end stage renal disease: Secondary | ICD-10-CM | POA: Diagnosis not present

## 2021-10-03 DIAGNOSIS — I5043 Acute on chronic combined systolic (congestive) and diastolic (congestive) heart failure: Secondary | ICD-10-CM | POA: Diagnosis not present

## 2021-10-03 DIAGNOSIS — I2510 Atherosclerotic heart disease of native coronary artery without angina pectoris: Secondary | ICD-10-CM | POA: Diagnosis not present

## 2021-10-03 DIAGNOSIS — I480 Paroxysmal atrial fibrillation: Secondary | ICD-10-CM | POA: Diagnosis not present

## 2021-10-03 DIAGNOSIS — J45909 Unspecified asthma, uncomplicated: Secondary | ICD-10-CM | POA: Diagnosis not present

## 2021-10-03 DIAGNOSIS — I428 Other cardiomyopathies: Secondary | ICD-10-CM | POA: Diagnosis not present

## 2021-10-04 ENCOUNTER — Ambulatory Visit (HOSPITAL_BASED_OUTPATIENT_CLINIC_OR_DEPARTMENT_OTHER): Payer: Medicare Other | Admitting: Orthopaedic Surgery

## 2021-10-04 ENCOUNTER — Ambulatory Visit (INDEPENDENT_AMBULATORY_CARE_PROVIDER_SITE_OTHER): Payer: Medicare Other | Admitting: Orthopaedic Surgery

## 2021-10-04 DIAGNOSIS — S72142A Displaced intertrochanteric fracture of left femur, initial encounter for closed fracture: Secondary | ICD-10-CM | POA: Diagnosis not present

## 2021-10-04 NOTE — Progress Notes (Signed)
Post Operative Evaluation    Procedure/Date of Surgery: Cephalomedullary nail left hip none April 04, 2021  Interval History:   10/04/2021: Presents today for follow-up of her left hip.  She was recently admitted to the hospital status post thoracic spine fusion.  Unfortunately x-rays and CT scan have revealed a likely nonunion of intertrochanteric hip fracture.  She continues to be able to put weight on this although with pain in the groin.  Presents today for follow-up of her left hip cephalomedullary nail.  Overall she is doing well.  She continues to weight-bear with a walker although this could progressing.  She is now on dialysis only twice per week.  She continues to struggle in terms of finding a possible surgeon for her thoracic compression issue.  She is awaiting   PMH/PSH/Family History/Social History/Meds/Allergies:    Past Medical History:  Diagnosis Date   AICD (automatic cardioverter/defibrillator) present    downgraded to CRT-P in 2014   Anemia    Arthritis    Asthma    Atrial fibrillation ([place]) 10/24/2015   Questionable history of A Fib/A Flutter. On device interrogation on 9/7, showed 0.53mn of A Fib/A Flutter with 1% burden.  Device check in Jan 2018 showed no sustained AF (runs of less than 30 seconds). No anticoagulation indicated.   CAD (coronary artery disease)    a. 10/09/16 LHC: SVG->LAD patent, SVG->Diag patent, SVG->RCA patent, SVG->LCx occluded. EF 60%, b. 10/31/16 LHC DES to AV groove Circ, DES to intermed branch   Chronic lower back pain    Fatty liver disease, nonalcoholic    GERD (gastroesophageal reflux disease)    Gout    H/O hiatal hernia    High cholesterol    Hyperplastic colon polyp    Hypertension    IBS (irritable bowel syndrome)    Internal hemorrhoids    INTERNAL HEMORRHOIDS WITHOUT MENTION COMP 04/12/2007   Qualifier: Diagnosis of  By: BOlevia PerchesMD, Dora M    Nonischemic cardiomyopathy ([facility] 11/22/2011    Pt responded to BiV ICD- last EF 55-60% Sept 2017   Obesity    OSA (obstructive sleep apnea)    "can't tolerate a mask", wears 2L nocturnal O2   Presence of permanent cardiac pacemaker    11/02/12 [employer] V273 INTUA PPM   Stroke ([facility] 09/2020   Type II diabetes mellitus ([facility]    Past Surgical History:  Procedure Laterality Date   ABDOMINAL ULTRASOUND  12/01/2011   Peripelvic cysts- #1- 2.4x1.9x2.3cm, #2-1.2x0.9x1.2cm   ANKLE FRACTURE SURGERY Right    "had rod put in"   ANTERIOR CERVICAL DECOMP/DISCECTOMY FUSION     APPENDECTOMY     BACK SURGERY     BIOPSY  12/29/2017   Procedure: BIOPSY;  Surgeon: NMauri Pole MD;  [facility];  Service: Endoscopy;;   BIV ICD INSERTION CRT-D  2001?   BIV PACEMAKER GENERATOR CHANGE OUT N/A 11/02/2012   Procedure: BIV PACEMAKER GENERATOR CHANGE OUT;  Surgeon: MSanda Klein MD;  [facility];  Service: Cardiovascular;  Laterality: N/A;   CARDIAC CATHETERIZATION  05/17/1999   No significant coronary obstructive disease w/ mild 20% luminal irregularity of the first diag branch of the LAD   CARDIAC CATHETERIZATION  07/08/2002   No significant CAD, moderately depressed LV systolic function   CARDIAC CATHETERIZATION Bilateral 04/26/2007  Normal findings, recommend medical therapy   CARDIAC CATHETERIZATION  02/18/2008   Moderate CAD, would benefit from having a functional study, recommend continue medical therapy   CARDIAC CATHETERIZATION  07/23/2012   Medical therapy   CARDIAC CATHETERIZATION N/A 11/24/2014   Procedure: Left Heart Cath and Coronary Angiography;  Surgeon: Troy Sine, MD;  Location: [place] [facility];  Service: Cardiovascular;  Laterality: N/A;   CARDIAC CATHETERIZATION  11/27/2014   Procedure: Intravascular Pressure Wire/FFR Study;  Surgeon: Peter M Martinique, MD;  Location: [place] CV [facility];  Service: Cardiovascular;;   CARDIAC CATHETERIZATION  10/09/2016   CHOLECYSTECTOMY N/A 04/09/2018    Procedure: LAPAROSCOPIC CHOLECYSTECTOMY;  Surgeon: Greer Pickerel, MD;  [facility];  Service: General;  Laterality: N/A;   COLONOSCOPY WITH PROPOFOL N/A 12/29/2017   Procedure: COLONOSCOPY WITH PROPOFOL;  Surgeon: [name], MD;  [facility];  Service: Endoscopy;  Laterality: N/A;   CORONARY ANGIOGRAM  2010   CORONARY ARTERY BYPASS GRAFT N/A 11/29/2014   Procedure: CORONARY ARTERY BYPASS GRAFTING x 5 (LIMA-LAD, SVG-D, SVG-OM1-OM2, SVG-PD);  Surgeon: Melrose Nakayama, MD;  Location: [place];  Service: Open Heart Surgery;  Laterality: N/A;   CORONARY STENT INTERVENTION N/A 10/31/2016   Procedure: CORONARY STENT INTERVENTION;  Surgeon: Burnell Blanks, MD;  Location: [place] [facility];  Service: Cardiovascular;  Laterality: N/A;   ESOPHAGOGASTRODUODENOSCOPY (EGD) WITH PROPOFOL N/A 12/29/2017   Procedure: ESOPHAGOGASTRODUODENOSCOPY (EGD) WITH PROPOFOL;  Surgeon: [name], MD;  [facility];  Service: Endoscopy;  Laterality: N/A;   FRACTURE SURGERY     INSERT / REPLACE / REMOVE PACEMAKER  1999   INTRAMEDULLARY (IM) NAIL INTERTROCHANTERIC Left 04/04/2021   Procedure: INTRAMEDULLARY (IM) NAIL INTERTROCHANTRIC;  Surgeon: Vanetta Mulders, MD;  Location: [place];  Service: Orthopedics;  Laterality: Left;   IR FLUORO GUIDE CV LINE LEFT  04/11/2021   IR US GUIDE VASC ACCESS LEFT  04/11/2021   KNEE ARTHROSCOPY Bilateral    LEFT HEART CATH AND CORS/GRAFTS ANGIOGRAPHY N/A 12/09/2017   Procedure: LEFT HEART CATH AND CORS/GRAFTS ANGIOGRAPHY;  Surgeon: Troy Sine, MD;  Location: [place] [facility];  Service: Cardiovascular;  Laterality: N/A;   LEFT HEART CATHETERIZATION WITH CORONARY ANGIOGRAM N/A 07/23/2012   Procedure: LEFT HEART CATHETERIZATION WITH CORONARY ANGIOGRAM;  Surgeon: Leonie Man, MD;  Location: [facility];  Service: Cardiovascular;  Laterality: N/A;   LEXISCAN MYOVIEW  11/14/2011   Mild-moderate defect seen in Mid Inferolateral and Mid  Anterolateral regions-consistant w/ infarct/scar. No significant ischemia demonstrated.   LUMBAR PERCUTANEOUS PEDICLE SCREW 4 LEVEL N/A 09/10/2021   Procedure: THORACIC SEVEN THROUGH THORACIC ELEVEN  PERCUTANEOUS PEDICLE SCREW PLACEMENT;  Surgeon: Judith Part, MD;  Location: [place];  Service: Neurosurgery;  Laterality: N/A;   POLYPECTOMY  12/29/2017   Procedure: POLYPECTOMY;  Surgeon: [name], MD;  [facility];  Service: Endoscopy;;   PPM GENERATOR CHANGEOUT N/A 12/31/2020   Procedure: PPM GENERATOR CHANGEOUT;  Surgeon: Sanda Klein, MD;  Location: [place] [facility] LAB;  Service: Cardiovascular;  Laterality: N/A;   RIGHT/LEFT HEART CATH AND CORONARY ANGIOGRAPHY N/A 10/09/2016   Procedure: RIGHT/LEFT HEART CATH AND CORONARY ANGIOGRAPHY;  Surgeon: Jolaine Artist, MD;  Location: [place] [facility] LAB;  Service: Cardiovascular;  Laterality: N/A;   RIGHT/LEFT HEART CATH AND CORONARY/GRAFT ANGIOGRAPHY N/A 03/11/2019   Procedure: RIGHT/LEFT HEART CATH AND CORONARY/GRAFT ANGIOGRAPHY;  Surgeon: Jolaine Artist, MD;  Location: [place] [facility];  Service: Cardiovascular;  Laterality: N/A;  TEE WITHOUT CARDIOVERSION N/A 11/29/2014   Procedure: TRANSESOPHAGEAL ECHOCARDIOGRAM (TEE);  Surgeon: Melrose Nakayama, MD;  Location: [place];  Service: Open Heart Surgery;  Laterality: N/A;   TRANSCAROTID ARTERY REVASCULARIZATION  Right 10/01/2020   Procedure: RIGHT TRANSCAROTID ARTERY REVASCULARIZATION;  Surgeon: [name], MD;  Location: [place];  Service: Vascular;  Laterality: Right;   TRANSTHORACIC ECHOCARDIOGRAM  07/23/2012   EF 55-60%, normal-mild   TUBAL LIGATION     ULTRASOUND GUIDANCE FOR VASCULAR ACCESS Left 10/01/2020   Procedure: ULTRASOUND GUIDANCE FOR VASCULAR ACCESS;  Surgeon: [name], MD;  Location: [place];  Service: Vascular;  Laterality: Left;   Social History   Socioeconomic History   Marital status: Widowed    Spouse name: Calissa Swenor- deceased   Number of children: 5   Years of education: Not on file   Highest education level: Not on file  Occupational History   Occupation: STAFF/BUFFET    Employer: [employer]  Tobacco Use   Smoking status: Former    Packs/day: 0.25    Years: 3.00    Total pack years: 0.75    Types: Cigarettes    Quit date: 02/11/1968    Years since quitting: 53.6   Smokeless tobacco: Never  Vaping Use   Vaping Use: Never used  Substance and Sexual Activity   Alcohol use: No   Drug use: No   Sexual activity: Never  Other Topics Concern   Not on file  Social History Narrative   Widowed last year. Spouse Mike Gip Sr (28) died on date 02-01-12 2022/05/18) at his home Was a [place] [employer] [place] [employer]   She lives with her two sons, Thayer Ohm    Other sons Haze Rushing   Social Determinants of Health   Financial Resource Strain: High Risk (12/18/2016)   Overall Financial Resource Strain (CARDIA)    Difficulty of Paying Living Expenses: Very hard  Food Insecurity: No Food Insecurity (01/15/2021)   Hunger Vital Sign    Worried About Running Out of Food in the Last Year: Never true    [place] in the Last Year: Never true  Transportation Needs: No Transportation Needs (02/05/2021)   PRAPARE - Hydrologist (Medical): No    Lack of Transportation (Non-Medical): No  Physical Activity: Inactive (12/18/2016)   Exercise Vital Sign    Days of Exercise per Week: 0 days    Minutes of Exercise per Session: 0 min  Stress: Stress Concern Present (12/18/2016)   [place]    Feeling of Stress : Very much  Social Connections: Moderately Integrated (02/05/2021)   Social Connection and Isolation Panel [NHANES]    Frequency of Communication with Friends and Family: Three times a week    Frequency of Social Gatherings with Friends and Family: Three times a week     Attends Religious Services: 1 to 4 times per year    Active Member of Clubs or Organizations: Yes    Attends Archivist Meetings: 1 to 4 times per year    Marital Status: Widowed   Family History  Problem Relation Age of Onset   Breast cancer Mother    Diabetes Mother    Asthma Sister    Heart disease Maternal Grandmother    Kidney disease Maternal Grandmother    Diabetes Maternal Grandmother    Glaucoma Maternal Aunt  Heart disease Maternal Aunt    Colon cancer Neg Hx    Stomach cancer Neg Hx    Pancreatic cancer Neg Hx    Allergies  Allergen Reactions   Ivp Dye [Iodinated Contrast Media] Shortness Of Breath    No reaction to PO contrast with non-ionic dye.06-25-2014/rsm   Shellfish Allergy Anaphylaxis   Sulfa Antibiotics Shortness Of Breath   Iodine Hives   Atorvastatin Other (See Comments)    Pt states "causes bilateral leg pain/cramps."   Benadryl [Diphenhydramine] Other (See Comments)    "THIS DRIVES ME CRAZY AND MAKES ME FEEL LIKE I AM DYING"    Colchicine Nausea And Vomiting   Doxycycline Other (See Comments)    Unknown reaction, per patient   Uloric [Febuxostat] Other (See Comments)    Unknown reaction   Cephalexin Itching and Rash   Zithromax [Azithromycin] Rash   Current Outpatient Medications  Medication Sig Dispense Refill   ACCU-CHEK AVIVA PLUS test strip Check blood sugar twice daily     Accu-Chek Softclix Lancets lancets Check blood sugar twice daily     acetaminophen (TYLENOL) 500 MG tablet Take 1,000 mg by mouth 3 (three) times daily as needed (for pain or headaches).     albuterol (PROVENTIL HFA;VENTOLIN HFA) 108 (90 Base) MCG/ACT inhaler Inhale 2 puffs into the lungs every 6 (six) hours as needed for wheezing or shortness of breath. 1 Inhaler 0   albuterol (PROVENTIL) (2.5 MG/3ML) 0.083% nebulizer solution Take 3 mLs (2.5 mg total) by nebulization every 6 (six) hours as needed for wheezing or shortness of breath. 75 mL 0   Alcohol Swabs  (B-D SINGLE USE SWABS REGULAR) PADS      allopurinol (ZYLOPRIM) 100 MG tablet Take 1 tablet (100 mg total) by mouth daily. (Patient taking differently: Take 100 mg by mouth every morning.) 90 tablet 3   aspirin EC 81 MG tablet Take 1 tablet (81 mg total) by mouth daily. (Patient taking differently: Take 81 mg by mouth every morning.) 90 tablet 3   budesonide-formoterol (SYMBICORT) 160-4.5 MCG/ACT inhaler Inhale 2 puffs into the lungs 2 (two) times daily.     clopidogrel (PLAVIX) 75 MG tablet Take 1 tablet (75 mg total) by mouth daily. (Patient taking differently: Take 75 mg by mouth every morning.) 90 tablet 3   cycloSPORINE (RESTASIS) 0.05 % ophthalmic emulsion Place 1 drop into both eyes 2 (two) times daily.     EPINEPHrine 0.3 mg/0.3 mL IJ SOAJ injection Inject 0.3 mg into the muscle once as needed for anaphylaxis (severe allergic reaction).     furosemide (LASIX) 80 MG tablet Take 1 tablet (80 mg total) by mouth 2 (two) times daily.     hydrALAZINE (APRESOLINE) 25 MG tablet Take 1 tablet (25 mg total) by mouth every 8 (eight) hours. 90 tablet 1   hydrOXYzine (ATARAX) 25 MG tablet Take 25 mg by mouth 2 (two) times daily as needed for itching.     isosorbide mononitrate (IMDUR) 30 MG 24 hr tablet TAKE 3 TABLETS BY MOUTH  DAILY (Patient taking differently: Take 90 mg by mouth every morning.) 270 tablet 3   Lidocaine 0.5 % AERO Place 1 spray rectally every 6 (six) hours as needed (hemorrhoids). Preparation H rapid relief lidocaine spray     linaclotide (LINZESS) 145 MCG CAPS capsule Take 145 mcg by mouth daily before breakfast. (Patient not taking: Reported on 09/02/2021)     metoprolol succinate (TOPROL-XL) 50 MG 24 hr tablet Take 1 tablet (50 mg total) by  mouth daily. Take with or immediately following a meal. 30 tablet 1   nitroGLYCERIN (NITROSTAT) 0.4 MG SL tablet Place 1 tablet (0.4 mg total) under the tongue every 5 (five) minutes as needed for chest pain. 25 tablet 3   ondansetron (ZOFRAN) 4 MG  tablet Take 4 mg by mouth daily as needed for nausea or vomiting.     OXYGEN Inhale 2-3 L/min into the lungs continuous.     oxymetazoline (AFRIN) 0.05 % nasal spray Place 1 spray into both nostrils 2 (two) times daily as needed for congestion.     pantoprazole (PROTONIX) 40 MG tablet Take 1 tablet (40 mg total) by mouth 2 (two) times daily. 180 tablet 3   polyethylene glycol (MIRALAX / GLYCOLAX) 17 g packet Take 17 g by mouth daily as needed (constipation).     Potassium Chloride ER 20 MEQ TBCR Take 20 mEq by mouth daily in the afternoon.     predniSONE (DELTASONE) 10 MG tablet Take 1 tablet (10 mg total) by mouth daily with breakfast. 30 tablet 1   rosuvastatin (CRESTOR) 20 MG tablet Take 1 tablet (20 mg total) by mouth every evening. NEED OV. 90 tablet 3   sucralfate (CARAFATE) 1 g tablet Take 1 tablet (1 g total) by mouth 4 (four) times daily -  with meals and at bedtime. (Patient taking differently: Take 2 g by mouth 2 (two) times daily.)     traMADol (ULTRAM) 50 MG tablet Take 50 mg by mouth daily as needed (pain).     vitamin B-12 (CYANOCOBALAMIN) 1000 MCG tablet Take 1 tablet (1,000 mcg total) by mouth daily. 30 tablet 1   No current facility-administered medications for this visit.   No results found.  Review of Systems:   A ROS was performed including pertinent positives and negatives as documented in the HPI.   Musculoskeletal Exam:    There were no vitals taken for this visit.  She has no pain with 20 degrees internal and external rotation of the left hip.  She is able to flex at the left hip while sitting in her wheelchair.  She able to extend at the left knee and fires dorsiflexors of the left toes.  Sensation intact throughout.    Imaging:    None  I personally reviewed and interpreted the radiographs.   Assessment:   76 year old female status post left hip cephalomedullary nail for intertrochanteric/subtrochanteric femur fracture.  Unfortunately she does appear to  have a nonunion of the left hip.  At this time I do believe that she would benefit from a mobility aid/scooter to allow for mobility but to limit her walking on this left hip given her nonunion.  I did discuss what would entail for a revision nailing procedure versus arthroplasty which would be quite a significant surgery that I do not believe she would be able to tolerate.  To that effect we will plan on eating her mobility at this time with a scooter.  Plan :    -Return to clinic as needed    I personally saw and evaluated the patient, and participated in the management and treatment plan.  Vanetta Mulders, MD Attending Physician, Orthopedic Surgery  This document was dictated using Dragon voice recognition software. A reasonable attempt at proof reading has been made to minimize errors.

## 2021-10-06 DIAGNOSIS — N1832 Chronic kidney disease, stage 3b: Secondary | ICD-10-CM | POA: Diagnosis not present

## 2021-10-06 DIAGNOSIS — M6281 Muscle weakness (generalized): Secondary | ICD-10-CM | POA: Diagnosis not present

## 2021-10-06 DIAGNOSIS — J9621 Acute and chronic respiratory failure with hypoxia: Secondary | ICD-10-CM | POA: Diagnosis not present

## 2021-10-06 DIAGNOSIS — J9612 Chronic respiratory failure with hypercapnia: Secondary | ICD-10-CM | POA: Diagnosis not present

## 2021-10-06 DIAGNOSIS — J9611 Chronic respiratory failure with hypoxia: Secondary | ICD-10-CM | POA: Diagnosis not present

## 2021-10-06 DIAGNOSIS — I5022 Chronic systolic (congestive) heart failure: Secondary | ICD-10-CM | POA: Diagnosis not present

## 2021-10-06 DIAGNOSIS — M19072 Primary osteoarthritis, left ankle and foot: Secondary | ICD-10-CM | POA: Diagnosis not present

## 2021-10-07 DIAGNOSIS — J9621 Acute and chronic respiratory failure with hypoxia: Secondary | ICD-10-CM | POA: Diagnosis not present

## 2021-10-07 DIAGNOSIS — K219 Gastro-esophageal reflux disease without esophagitis: Secondary | ICD-10-CM | POA: Diagnosis not present

## 2021-10-07 DIAGNOSIS — J45909 Unspecified asthma, uncomplicated: Secondary | ICD-10-CM | POA: Diagnosis not present

## 2021-10-07 DIAGNOSIS — I132 Hypertensive heart and chronic kidney disease with heart failure and with stage 5 chronic kidney disease, or end stage renal disease: Secondary | ICD-10-CM | POA: Diagnosis not present

## 2021-10-07 DIAGNOSIS — K589 Irritable bowel syndrome without diarrhea: Secondary | ICD-10-CM | POA: Diagnosis not present

## 2021-10-07 DIAGNOSIS — M545 Low back pain, unspecified: Secondary | ICD-10-CM | POA: Diagnosis not present

## 2021-10-07 DIAGNOSIS — I480 Paroxysmal atrial fibrillation: Secondary | ICD-10-CM | POA: Diagnosis not present

## 2021-10-07 DIAGNOSIS — K760 Fatty (change of) liver, not elsewhere classified: Secondary | ICD-10-CM | POA: Diagnosis not present

## 2021-10-07 DIAGNOSIS — N186 End stage renal disease: Secondary | ICD-10-CM | POA: Diagnosis not present

## 2021-10-07 DIAGNOSIS — G4733 Obstructive sleep apnea (adult) (pediatric): Secondary | ICD-10-CM | POA: Diagnosis not present

## 2021-10-07 DIAGNOSIS — I428 Other cardiomyopathies: Secondary | ICD-10-CM | POA: Diagnosis not present

## 2021-10-07 DIAGNOSIS — I442 Atrioventricular block, complete: Secondary | ICD-10-CM | POA: Diagnosis not present

## 2021-10-07 DIAGNOSIS — I5043 Acute on chronic combined systolic (congestive) and diastolic (congestive) heart failure: Secondary | ICD-10-CM | POA: Diagnosis not present

## 2021-10-07 DIAGNOSIS — I959 Hypotension, unspecified: Secondary | ICD-10-CM | POA: Diagnosis not present

## 2021-10-07 DIAGNOSIS — D696 Thrombocytopenia, unspecified: Secondary | ICD-10-CM | POA: Diagnosis not present

## 2021-10-07 DIAGNOSIS — E7800 Pure hypercholesterolemia, unspecified: Secondary | ICD-10-CM | POA: Diagnosis not present

## 2021-10-07 DIAGNOSIS — D519 Vitamin B12 deficiency anemia, unspecified: Secondary | ICD-10-CM | POA: Diagnosis not present

## 2021-10-07 DIAGNOSIS — M109 Gout, unspecified: Secondary | ICD-10-CM | POA: Diagnosis not present

## 2021-10-07 DIAGNOSIS — B3731 Acute candidiasis of vulva and vagina: Secondary | ICD-10-CM | POA: Diagnosis not present

## 2021-10-07 DIAGNOSIS — M1990 Unspecified osteoarthritis, unspecified site: Secondary | ICD-10-CM | POA: Diagnosis not present

## 2021-10-07 DIAGNOSIS — I2510 Atherosclerotic heart disease of native coronary artery without angina pectoris: Secondary | ICD-10-CM | POA: Diagnosis not present

## 2021-10-07 DIAGNOSIS — G8929 Other chronic pain: Secondary | ICD-10-CM | POA: Diagnosis not present

## 2021-10-07 DIAGNOSIS — E1122 Type 2 diabetes mellitus with diabetic chronic kidney disease: Secondary | ICD-10-CM | POA: Diagnosis not present

## 2021-10-08 DIAGNOSIS — I5032 Chronic diastolic (congestive) heart failure: Secondary | ICD-10-CM | POA: Diagnosis not present

## 2021-10-08 DIAGNOSIS — I429 Cardiomyopathy, unspecified: Secondary | ICD-10-CM | POA: Diagnosis not present

## 2021-10-08 DIAGNOSIS — D649 Anemia, unspecified: Secondary | ICD-10-CM | POA: Diagnosis not present

## 2021-10-08 DIAGNOSIS — I2510 Atherosclerotic heart disease of native coronary artery without angina pectoris: Secondary | ICD-10-CM | POA: Diagnosis not present

## 2021-10-08 DIAGNOSIS — J9610 Chronic respiratory failure, unspecified whether with hypoxia or hypercapnia: Secondary | ICD-10-CM | POA: Diagnosis not present

## 2021-10-08 DIAGNOSIS — I5033 Acute on chronic diastolic (congestive) heart failure: Secondary | ICD-10-CM | POA: Diagnosis not present

## 2021-10-08 DIAGNOSIS — N184 Chronic kidney disease, stage 4 (severe): Secondary | ICD-10-CM | POA: Diagnosis not present

## 2021-10-08 DIAGNOSIS — E1169 Type 2 diabetes mellitus with other specified complication: Secondary | ICD-10-CM | POA: Diagnosis not present

## 2021-10-08 DIAGNOSIS — D72819 Decreased white blood cell count, unspecified: Secondary | ICD-10-CM | POA: Diagnosis not present

## 2021-10-08 DIAGNOSIS — D696 Thrombocytopenia, unspecified: Secondary | ICD-10-CM | POA: Diagnosis not present

## 2021-10-10 ENCOUNTER — Encounter (HOSPITAL_COMMUNITY): Payer: Self-pay | Admitting: Internal Medicine

## 2021-10-10 ENCOUNTER — Ambulatory Visit (HOSPITAL_COMMUNITY)
Admission: RE | Admit: 2021-10-10 | Discharge: 2021-10-10 | Disposition: A | Discharge status: Home / Self Care | Payer: Medicare Other | Source: Ambulatory Visit | Attending: Internal Medicine | Admitting: Internal Medicine

## 2021-10-10 ENCOUNTER — Other Ambulatory Visit (HOSPITAL_COMMUNITY): Payer: Self-pay | Admitting: Internal Medicine

## 2021-10-10 VITALS — BP 90/50 | HR 80

## 2021-10-10 DIAGNOSIS — Z79899 Other long term (current) drug therapy: Secondary | ICD-10-CM | POA: Diagnosis not present

## 2021-10-10 DIAGNOSIS — Z951 Presence of aortocoronary bypass graft: Secondary | ICD-10-CM | POA: Diagnosis not present

## 2021-10-10 DIAGNOSIS — J45909 Unspecified asthma, uncomplicated: Secondary | ICD-10-CM | POA: Insufficient documentation

## 2021-10-10 DIAGNOSIS — R600 Localized edema: Secondary | ICD-10-CM | POA: Insufficient documentation

## 2021-10-10 DIAGNOSIS — I5042 Chronic combined systolic (congestive) and diastolic (congestive) heart failure: Secondary | ICD-10-CM | POA: Diagnosis not present

## 2021-10-10 DIAGNOSIS — I25708 Atherosclerosis of coronary artery bypass graft(s), unspecified, with other forms of angina pectoris: Secondary | ICD-10-CM

## 2021-10-10 DIAGNOSIS — G4733 Obstructive sleep apnea (adult) (pediatric): Secondary | ICD-10-CM | POA: Diagnosis not present

## 2021-10-10 DIAGNOSIS — J9610 Chronic respiratory failure, unspecified whether with hypoxia or hypercapnia: Secondary | ICD-10-CM | POA: Insufficient documentation

## 2021-10-10 DIAGNOSIS — I2510 Atherosclerotic heart disease of native coronary artery without angina pectoris: Secondary | ICD-10-CM | POA: Insufficient documentation

## 2021-10-10 DIAGNOSIS — I130 Hypertensive heart and chronic kidney disease with heart failure and stage 1 through stage 4 chronic kidney disease, or unspecified chronic kidney disease: Secondary | ICD-10-CM | POA: Diagnosis not present

## 2021-10-10 DIAGNOSIS — M1990 Unspecified osteoarthritis, unspecified site: Secondary | ICD-10-CM | POA: Insufficient documentation

## 2021-10-10 DIAGNOSIS — K219 Gastro-esophageal reflux disease without esophagitis: Secondary | ICD-10-CM | POA: Diagnosis not present

## 2021-10-10 DIAGNOSIS — N1832 Chronic kidney disease, stage 3b: Secondary | ICD-10-CM | POA: Diagnosis not present

## 2021-10-10 DIAGNOSIS — E1122 Type 2 diabetes mellitus with diabetic chronic kidney disease: Secondary | ICD-10-CM | POA: Insufficient documentation

## 2021-10-10 DIAGNOSIS — N184 Chronic kidney disease, stage 4 (severe): Secondary | ICD-10-CM | POA: Diagnosis not present

## 2021-10-10 DIAGNOSIS — M549 Dorsalgia, unspecified: Secondary | ICD-10-CM | POA: Insufficient documentation

## 2021-10-10 DIAGNOSIS — I5032 Chronic diastolic (congestive) heart failure: Secondary | ICD-10-CM

## 2021-10-10 LAB — BASIC METABOLIC PANEL
Anion gap: 10 (ref 5–15)
BUN: 96 mg/dL — ABNORMAL HIGH (ref 8–23)
CO2: 31 mmol/L (ref 22–32)
Calcium: 9.2 mg/dL (ref 8.9–10.3)
Chloride: 100 mmol/L (ref 98–111)
Creatinine, Ser: 4.07 mg/dL — ABNORMAL HIGH (ref 0.44–1.00)
GFR, Estimated: 11 mL/min — ABNORMAL LOW (ref >60)
Glucose, Bld: 157 mg/dL — ABNORMAL HIGH (ref 70–99)
Potassium: 3.2 mmol/L — ABNORMAL LOW (ref 3.5–5.1)
Sodium: 141 mmol/L (ref 135–145)

## 2021-10-10 MED ORDER — POTASSIUM CHLORIDE ER 20 MEQ PO TBCR: 20.0000 meq | EXTENDED_RELEASE_TABLET | Freq: Every day | ORAL | 3 refills | Status: DC

## 2021-10-10 MED ORDER — METOLAZONE 2.5 MG PO TABS: 2.5000 mg | ORAL_TABLET | ORAL | 3 refills | Status: DC

## 2021-10-10 NOTE — Progress Notes (Signed)
ADVANCED HF CLINIC NOTE  Patient ID: [name], female   DOB: [birth year], 76 y.o.   MRN: [id number]  Primary Cardiologist: Dr. Sanda Klein Nephrology: Dr. Moshe Cipro Primary HF: Dr. Haroldine Laws   HPI: [name] is a 76 y.o. with history of morbid obesity, DM, HTN, asthma, OSA, ICD, GERD, arthritis, CRT-D 2008 [employer], CAD, S/P CABG x5 11/29/2014.  Discharged from [facility] 12/07/14 s/p CABG. Discharge weight 252.  Went to [facility] SN[facility].  Admitted 01/07/21  A/C HFpEF. HS  Diuresed with IV lasix and transitioned to torsemide 40 mg daily. Bisoprolol was increased to 10 mg daily.   Admitted 09/09/21 after a fall. Creatinine on admit 4.9. Had T9-T10  vertebral fracture. T7-T11 pedicle screw placement 8/1. Diuretics changed to lasix 80 mg twice a day due elevated creatinine. Creatinine at d/c 2.57.   Today she returns for HF follow up. Complaining of leg edema and back pain. SOB with exertion.  Denies PND/Orthopnea. Using 3 liters [place]. Walking on when she has someone at home. Using walker. Appetite ok. No fever or chills. Weight at home  193  pounds. Taking all medications. Has HH setting up her pill box. Has transportation [facility].   NO SGLT2I due to UTI/yeast infections.   Cardiac Studies: - Echo (8/22): EF 55-60%, grade I DD, RV ok  - Cath 10/09/16 1. Severe 3v CAD 2. LIMA  to LAD widely patent 3. SVG to Diagonal widely patent 4. SVG to RCA widely patent 5. SVG to LCx and OM occluded ostially.  Underwent to stent to LCX and Ramus 6. LVEF 60% 7. Mild PAH with normal PCWP and output  - Underwent PCI 10/31/16:  -Successful PTCA/DES x 3 AV groove circumflex -Successful PTCA/DES x 1 intermediate branch   Findings:   Ao = 117/61 (83) LV = 126/8 RA = 11 RV = 39/10 PA = 37/16 (25) PCW = 13 Fick cardiac output/index = 5.1/2.3 PVR = 2.4 WU FA sat = 95% PA sat = 65%   Assessment: 1. Severe 3v CAD 2. LIMA  to LAD widely patent 3. SVG to Diagonal widely  patent 4. SVG to RCA widely patent 5. SVG to LCx and OM occluded ostially 6. LVEF 60% 7. Mild PAH with normal PCWP and output    - Echo 11/24/14 EF 55-60%, Grade 1 DD. Mild TR - TEE 11/29/14 EF 55-60%, LVH, Mild AR and MR - Echo 9/17: EF 55-60% LVH Past Medical History:  Diagnosis Date   AICD (automatic cardioverter/defibrillator) present    downgraded to CRT-P in 2014   Anemia    Arthritis    Asthma    Atrial fibrillation ([place]) 10/24/2015   Questionable history of A Fib/A Flutter. On device interrogation on 9/7, showed 0.67mn of A Fib/A Flutter with 1% burden.  Device check in Jan 2018 showed no sustained AF (runs of less than 30 seconds). No anticoagulation indicated.   CAD (coronary artery disease)    a. 10/09/16 LHC: SVG->LAD patent, SVG->Diag patent, SVG->RCA patent, SVG->LCx occluded. EF 60%, b. 10/31/16 LHC DES to AV groove Circ, DES to intermed branch   Chronic lower back pain    Fatty liver disease, nonalcoholic    GERD (gastroesophageal reflux disease)    Gout    H/O hiatal hernia    High cholesterol    Hyperplastic colon polyp    Hypertension    IBS (irritable bowel syndrome)    Internal hemorrhoids    INTERNAL HEMORRHOIDS WITHOUT MENTION COMP 04/12/2007   Qualifier: Diagnosis  of  By: Olevia Perches MD, Lowella Bandy    Nonischemic cardiomyopathy ([place]) 11/22/2011   Pt responded to BiV ICD- last EF 55-60% Sept 2017   Obesity    OSA (obstructive sleep apnea)    "can't tolerate a mask", wears 2L nocturnal O2   Presence of permanent cardiac pacemaker    11/02/12 [employer] V273 INTUA PPM   Stroke [facility]) 09/2020   Type II diabetes mellitus (HCC)    Current Outpatient Medications  Medication Sig Dispense Refill   ACCU-CHEK AVIVA PLUS test strip Check blood sugar twice daily     Accu-Chek Softclix Lancets lancets Check blood sugar twice daily     acetaminophen (TYLENOL) 500 MG tablet Take 1,000 mg by mouth 3 (three) times daily as needed (for pain or headaches).     albuterol  (PROVENTIL HFA;VENTOLIN HFA) 108 (90 Base) MCG/ACT inhaler Inhale 2 puffs into the lungs every 6 (six) hours as needed for wheezing or shortness of breath. 1 Inhaler 0   albuterol (PROVENTIL) (2.5 MG/3ML) 0.083% nebulizer solution Take 3 mLs (2.5 mg total) by nebulization every 6 (six) hours as needed for wheezing or shortness of breath. 75 mL 0   Alcohol Swabs (B-D SINGLE USE SWABS REGULAR) PADS      allopurinol (ZYLOPRIM) 100 MG tablet Take 1 tablet (100 mg total) by mouth daily. 90 tablet 3   aspirin EC 81 MG tablet Take 1 tablet (81 mg total) by mouth daily. 90 tablet 3   budesonide-formoterol (SYMBICORT) 160-4.5 MCG/ACT inhaler Inhale 2 puffs into the lungs 2 (two) times daily.     clopidogrel (PLAVIX) 75 MG tablet Take 1 tablet (75 mg total) by mouth daily. 90 tablet 3   cycloSPORINE (RESTASIS) 0.05 % ophthalmic emulsion Place 1 drop into both eyes 2 (two) times daily.     EPINEPHrine 0.3 mg/0.3 mL IJ SOAJ injection Inject 0.3 mg into the muscle once as needed for anaphylaxis (severe allergic reaction).     furosemide (LASIX) 80 MG tablet Take 1 tablet (80 mg total) by mouth 2 (two) times daily.     hydrOXYzine (ATARAX) 25 MG tablet Take 25 mg by mouth 2 (two) times daily as needed for itching.     isosorbide mononitrate (IMDUR) 30 MG 24 hr tablet TAKE 3 TABLETS BY MOUTH  DAILY 270 tablet 3   Lidocaine 0.5 % AERO Place 1 spray rectally every 6 (six) hours as needed (hemorrhoids). Preparation H rapid relief lidocaine spray     metolazone (ZAROXOLYN) 2.5 MG tablet Take 1 tablet (2.5 mg total) by mouth once a week. AS NEEDED 10 tablet 3   nitroGLYCERIN (NITROSTAT) 0.4 MG SL tablet Place 1 tablet (0.4 mg total) under the tongue every 5 (five) minutes as needed for chest pain. 25 tablet 3   ondansetron (ZOFRAN) 4 MG tablet Take 4 mg by mouth daily as needed for nausea or vomiting.     OXYGEN Inhale 2-3 L/min into the lungs continuous.     oxymetazoline (AFRIN) 0.05 % nasal spray Place 1 spray into  both nostrils 2 (two) times daily as needed for congestion.     pantoprazole (PROTONIX) 40 MG tablet Take 1 tablet (40 mg total) by mouth 2 (two) times daily. 180 tablet 3   polyethylene glycol (MIRALAX / GLYCOLAX) 17 g packet Take 17 g by mouth daily as needed (constipation).     predniSONE (DELTASONE) 10 MG tablet Take 10 mg by mouth daily with breakfast.     simethicone (MYLICON) 80 MG  chewable tablet Chew 80 mg by mouth 4 (four) times daily as needed for flatulence.     sucralfate (CARAFATE) 1 g tablet Take 1 tablet (1 g total) by mouth 4 (four) times daily -  with meals and at bedtime.     traMADol (ULTRAM) 50 MG tablet Take 50 mg by mouth daily as needed (pain).     vitamin B-12 (CYANOCOBALAMIN) 1000 MCG tablet Take 1 tablet (1,000 mcg total) by mouth daily. 30 tablet 1   Potassium Chloride ER 20 MEQ TBCR Take 20 mEq by mouth daily in the afternoon. 50 tablet 3   predniSONE (DELTASONE) 10 MG tablet Take 1 tablet (10 mg total) by mouth daily with breakfast. 30 tablet 1   rosuvastatin (CRESTOR) 20 MG tablet Take 1 tablet (20 mg total) by mouth every evening. NEED OV. 90 tablet 3   No current facility-administered medications for this encounter.   Allergies  Allergen Reactions   Ivp Dye [Iodinated Contrast Media] Shortness Of Breath    No reaction to PO contrast with non-ionic dye.06-25-2014/rsm   Shellfish Allergy Anaphylaxis   Sulfa Antibiotics Shortness Of Breath   Iodine Hives   Atorvastatin Other (See Comments)    Pt states "causes bilateral leg pain/cramps."   Benadryl [Diphenhydramine] Other (See Comments)    "THIS DRIVES ME CRAZY AND MAKES ME FEEL LIKE I AM DYING"    Colchicine Nausea And Vomiting   Doxycycline Other (See Comments)    Unknown reaction, per patient   Uloric [Febuxostat] Other (See Comments)    Unknown reaction   Cephalexin Itching and Rash   Zithromax [Azithromycin] Rash   Social History   Socioeconomic History   Marital status: Widowed    Spouse name:  Aarohi Redditt- deceased   Number of children: 5   Years of education: Not on file   Highest education level: Not on file  Occupational History   Occupation: STAFF/BUFFET    Employer: [employer]  Tobacco Use   Smoking status: Former    Packs/day: 0.25    Years: 3.00    Total pack years: 0.75    Types: Cigarettes    Quit date: 02/11/1968    Years since quitting: 53.6   Smokeless tobacco: Never  Vaping Use   Vaping Use: Never used  Substance and Sexual Activity   Alcohol use: No   Drug use: No   Sexual activity: Never  Other Topics Concern   Not on file  Social History Narrative   Widowed last year. Spouse Mike Gip Sr (76) died on date 2012/01/21 05/07/22) at his home Was a [place] [employer] -[place]   She lives with her two sons, Thayer Ohm    Other sons Haze Rushing   Social Determinants of Health   Financial Resource Strain: High Risk (12/18/2016)   Overall Financial Resource Strain (CARDIA)    Difficulty of Paying Living Expenses: Very hard  Food Insecurity: No Food Insecurity (01/15/2021)   Hunger Vital Sign    Worried About Running Out of Food in the Last Year: Never true    [place] in the Last Year: Never true  Transportation Needs: No Transportation Needs (02/05/2021)   PRAPARE - Hydrologist (Medical): No    Lack of Transportation (Non-Medical): No  Physical Activity: Inactive (12/18/2016)   Exercise Vital Sign    Days of Exercise per Week: 0 days    Minutes of Exercise per Session:  0 min  Stress: Stress Concern Present (12/18/2016)   [place]    Feeling of Stress : Very much  Social Connections: Moderately Integrated (02/05/2021)   Social Connection and Isolation Panel [NHANES]    Frequency of Communication with Friends and Family: Three times a week    Frequency of Social Gatherings with Friends and Family: Three  times a week    Attends Religious Services: 1 to 4 times per year    Active Member of Clubs or Organizations: Yes    Attends Archivist Meetings: 1 to 4 times per year    Marital Status: Widowed  Intimate Partner Violence: Not At Risk (02/05/2021)   Humiliation, Afraid, Rape, and Kick questionnaire    Fear of Current or Ex-Partner: No    Emotionally Abused: No    Physically Abused: No    Sexually Abused: No   Family History  Problem Relation Age of Onset   Breast cancer Mother    Diabetes Mother    Asthma Sister    Heart disease Maternal Grandmother    Kidney disease Maternal Grandmother    Diabetes Maternal Grandmother    Glaucoma Maternal Aunt    Heart disease Maternal Aunt    Colon cancer Neg Hx    Stomach cancer Neg Hx    Pancreatic cancer Neg Hx    BP (!) 90/50   Pulse 80   SpO2 100% Comment: 3l n/c  Wt Readings from Last 3 Encounters:  09/09/21 89.8 kg (198 lb)  09/02/21 91.2 kg (201 lb)  08/21/21 92.1 kg (203 lb)    PHYSICAL EXAM: General:  In the wheel chair. On 3 liters [place].  HEENT: normal Neck: supple. no JVD. Carotids 2+ bilat; no bruits. No lymphadenopathy or thryomegaly appreciated. Cor: PMI nondisplaced. Regular rate & rhythm. No rubs, gallops or murmurs. Lungs: clear Abdomen: soft, nontender, nondistended. No hepatosplenomegaly. No bruits or masses. Good bowel sounds. Extremities: no cyanosis, clubbing, rash, R and LLE 2+ edema Neuro: alert & orientedx3, cranial nerves grossly intact. moves all 4 extremities w/o difficulty. Affect pleasant  ASSESSMENT & PLAN: 1. Chronic systolic/diastolic HF  - Echo 9/70 EF 55-60%, mild LVH.  - RHC in 8/18 looked good. - Echo 12/20 EF down to 40-45% in setting of loss of LV pacing which has now been restored - Echo 4/21 EF 50-55%--->09/2020 EF 55-60% RV normal.  - NYHA III.- Mildly volume overloaded. Continue lasix 80 mg twice a day . Add 2.5 mg metolazone as needed. Continue compression stockings - Stop  hydralazine/metoprolol xl with low BP  - Wrap legs with unna boot via HHRN   2. CAD s/p CABG 11/2014 - s/p LCX stents in 2018. - On statin.  - No chest pain.   3. Morbid obesity - There is no height or weight on file to calculate BMI. Unable to stand  - Consider referral to pharmacy for semaglutide.  4. H/o heart block s/p BiV pacer/CRT-D [employer] - Had gen change out 12/31/20 by Dr Orene Desanctis.  5. CKD IIIb - SCr baseline unclear baseline. At d/c creatinine 2.6  - Encouraged her to follow up with Nephrology. - Check BMET today  6. OSA - Says she can't tolerate CPAP.  7. Chronic respiratory failure - Likely OSA/OHS - Continue O2 3 liters [place]. Sats stable.   8. DM2 - A1c 6.5 11/22 - No SGLT2i with UTI/yeast infections.   Glori Bickers, MD   10:50 PM

## 2021-10-10 NOTE — Patient Instructions (Signed)
Medication Changes:  STOP Hydralazine  STOP Metoprolol XL  Take Metolazone 2.5 mg AS NEEDED, **NO MORE THAN ONCE A WEEK  When you take Metolazone take an extra 40 meq (2 tabs) of Potassium  Lab Work:  Labs done today, your results will be available in MyChart, we will contact you for abnormal readings.  Testing/Procedures:  none  Referrals:  none  Special Instructions // Education:  Do the following things EVERYDAY: Weigh yourself in the morning before breakfast. Write it down and keep it in a log. Take your medicines as prescribed Eat low salt foods--Limit salt (sodium) to 2000 mg per day.  Stay as active as you can everyday Limit all fluids for the day to less than 2 liters  [facility]) has been contacted to wrap your legs with Louretta Parma Boots (compression bandages)  Follow-Up in: 3 months  At the [facility], you and your health needs are our priority. We have a designated team specialized in the treatment of Heart Failure. This Care Team includes your primary Heart Failure Specialized Cardiologist (physician), Advanced Practice Providers (APPs- Physician Assistants and Nurse Practitioners), and Pharmacist who all work together to provide you with the care you need, when you need it.   You may see any of the following providers on your designated Care Team at your next follow up:  Dr Glori Bickers Dr Haynes Kerns, NP Lyda Jester, [place] [facility] [place], [place] Audry Riles, PharmD   Please be sure to bring in all your medications bottles to every appointment.   Need to Contact us:  If you have any questions or concerns before your next appointment please send [place] a message through [place] or call our office at [phone].    TO LEAVE A MESSAGE FOR THE NURSE SELECT OPTION 2, PLEASE LEAVE A MESSAGE INCLUDING: YOUR NAME DATE OF BIRTH CALL BACK NUMBER REASON FOR CALL**this is important as we prioritize the  call backs  YOU WILL RECEIVE A CALL BACK THE SAME DAY AS LONG AS YOU CALL BEFORE 4:00 PM

## 2021-10-11 ENCOUNTER — Telehealth (HOSPITAL_COMMUNITY): Payer: Self-pay | Admitting: *Deleted

## 2021-10-11 DIAGNOSIS — M1990 Unspecified osteoarthritis, unspecified site: Secondary | ICD-10-CM | POA: Diagnosis not present

## 2021-10-11 DIAGNOSIS — J45909 Unspecified asthma, uncomplicated: Secondary | ICD-10-CM | POA: Diagnosis not present

## 2021-10-11 DIAGNOSIS — I959 Hypotension, unspecified: Secondary | ICD-10-CM | POA: Diagnosis not present

## 2021-10-11 DIAGNOSIS — B3731 Acute candidiasis of vulva and vagina: Secondary | ICD-10-CM | POA: Diagnosis not present

## 2021-10-11 DIAGNOSIS — D519 Vitamin B12 deficiency anemia, unspecified: Secondary | ICD-10-CM | POA: Diagnosis not present

## 2021-10-11 DIAGNOSIS — I2510 Atherosclerotic heart disease of native coronary artery without angina pectoris: Secondary | ICD-10-CM | POA: Diagnosis not present

## 2021-10-11 DIAGNOSIS — K589 Irritable bowel syndrome without diarrhea: Secondary | ICD-10-CM | POA: Diagnosis not present

## 2021-10-11 DIAGNOSIS — M109 Gout, unspecified: Secondary | ICD-10-CM | POA: Diagnosis not present

## 2021-10-11 DIAGNOSIS — E7800 Pure hypercholesterolemia, unspecified: Secondary | ICD-10-CM | POA: Diagnosis not present

## 2021-10-11 DIAGNOSIS — I428 Other cardiomyopathies: Secondary | ICD-10-CM | POA: Diagnosis not present

## 2021-10-11 DIAGNOSIS — D696 Thrombocytopenia, unspecified: Secondary | ICD-10-CM | POA: Diagnosis not present

## 2021-10-11 DIAGNOSIS — N184 Chronic kidney disease, stage 4 (severe): Secondary | ICD-10-CM

## 2021-10-11 DIAGNOSIS — J9621 Acute and chronic respiratory failure with hypoxia: Secondary | ICD-10-CM | POA: Diagnosis not present

## 2021-10-11 DIAGNOSIS — K219 Gastro-esophageal reflux disease without esophagitis: Secondary | ICD-10-CM | POA: Diagnosis not present

## 2021-10-11 DIAGNOSIS — G8929 Other chronic pain: Secondary | ICD-10-CM | POA: Diagnosis not present

## 2021-10-11 DIAGNOSIS — I5043 Acute on chronic combined systolic (congestive) and diastolic (congestive) heart failure: Secondary | ICD-10-CM | POA: Diagnosis not present

## 2021-10-11 DIAGNOSIS — M545 Low back pain, unspecified: Secondary | ICD-10-CM | POA: Diagnosis not present

## 2021-10-11 DIAGNOSIS — I132 Hypertensive heart and chronic kidney disease with heart failure and with stage 5 chronic kidney disease, or end stage renal disease: Secondary | ICD-10-CM | POA: Diagnosis not present

## 2021-10-11 DIAGNOSIS — I5032 Chronic diastolic (congestive) heart failure: Secondary | ICD-10-CM

## 2021-10-11 DIAGNOSIS — K760 Fatty (change of) liver, not elsewhere classified: Secondary | ICD-10-CM | POA: Diagnosis not present

## 2021-10-11 DIAGNOSIS — G4733 Obstructive sleep apnea (adult) (pediatric): Secondary | ICD-10-CM | POA: Diagnosis not present

## 2021-10-11 DIAGNOSIS — I25708 Atherosclerosis of coronary artery bypass graft(s), unspecified, with other forms of angina pectoris: Secondary | ICD-10-CM

## 2021-10-11 DIAGNOSIS — I480 Paroxysmal atrial fibrillation: Secondary | ICD-10-CM | POA: Diagnosis not present

## 2021-10-11 DIAGNOSIS — N186 End stage renal disease: Secondary | ICD-10-CM | POA: Diagnosis not present

## 2021-10-11 DIAGNOSIS — I442 Atrioventricular block, complete: Secondary | ICD-10-CM | POA: Diagnosis not present

## 2021-10-11 DIAGNOSIS — E1122 Type 2 diabetes mellitus with diabetic chronic kidney disease: Secondary | ICD-10-CM | POA: Diagnosis not present

## 2021-10-11 MED ORDER — POTASSIUM CHLORIDE ER 20 MEQ PO TBCR: 60.0000 meq | EXTENDED_RELEASE_TABLET | Freq: Every day | ORAL | 3 refills | Status: DC

## 2021-10-11 NOTE — Telephone Encounter (Addendum)
Called patient per Darrick Grinder, NP with following instructions:  1. Pt clarified she is currently taking potassium 40 meq daily. Asked her to increase to 30 meq daily.   2.  She does not have f/u scheduled with Nephrology (Dr. Clover Mealy), asked her to call office and schedule appt based on recommendation of Amy with CHF.   3. She will need repeat lab in one week. Pt says it is hard for her to get here for labs, but she has [facility] coming to her home once weekly.   Called [facility], verbal order for BMP next week given to Nurse Manager, Robinette Haines. He will fax results to [phone].  [place] contact # is [phone].

## 2021-10-15 DIAGNOSIS — M109 Gout, unspecified: Secondary | ICD-10-CM | POA: Diagnosis not present

## 2021-10-15 DIAGNOSIS — G8929 Other chronic pain: Secondary | ICD-10-CM | POA: Diagnosis not present

## 2021-10-15 DIAGNOSIS — I5043 Acute on chronic combined systolic (congestive) and diastolic (congestive) heart failure: Secondary | ICD-10-CM | POA: Diagnosis not present

## 2021-10-15 DIAGNOSIS — E1122 Type 2 diabetes mellitus with diabetic chronic kidney disease: Secondary | ICD-10-CM | POA: Diagnosis not present

## 2021-10-15 DIAGNOSIS — K760 Fatty (change of) liver, not elsewhere classified: Secondary | ICD-10-CM | POA: Diagnosis not present

## 2021-10-15 DIAGNOSIS — I2510 Atherosclerotic heart disease of native coronary artery without angina pectoris: Secondary | ICD-10-CM | POA: Diagnosis not present

## 2021-10-15 DIAGNOSIS — B3731 Acute candidiasis of vulva and vagina: Secondary | ICD-10-CM | POA: Diagnosis not present

## 2021-10-15 DIAGNOSIS — I132 Hypertensive heart and chronic kidney disease with heart failure and with stage 5 chronic kidney disease, or end stage renal disease: Secondary | ICD-10-CM | POA: Diagnosis not present

## 2021-10-15 DIAGNOSIS — I480 Paroxysmal atrial fibrillation: Secondary | ICD-10-CM | POA: Diagnosis not present

## 2021-10-15 DIAGNOSIS — G4733 Obstructive sleep apnea (adult) (pediatric): Secondary | ICD-10-CM | POA: Diagnosis not present

## 2021-10-15 DIAGNOSIS — M1990 Unspecified osteoarthritis, unspecified site: Secondary | ICD-10-CM | POA: Diagnosis not present

## 2021-10-15 DIAGNOSIS — E7800 Pure hypercholesterolemia, unspecified: Secondary | ICD-10-CM | POA: Diagnosis not present

## 2021-10-15 DIAGNOSIS — J9621 Acute and chronic respiratory failure with hypoxia: Secondary | ICD-10-CM | POA: Diagnosis not present

## 2021-10-15 DIAGNOSIS — J45909 Unspecified asthma, uncomplicated: Secondary | ICD-10-CM | POA: Diagnosis not present

## 2021-10-15 DIAGNOSIS — M545 Low back pain, unspecified: Secondary | ICD-10-CM | POA: Diagnosis not present

## 2021-10-15 DIAGNOSIS — K589 Irritable bowel syndrome without diarrhea: Secondary | ICD-10-CM | POA: Diagnosis not present

## 2021-10-15 DIAGNOSIS — D696 Thrombocytopenia, unspecified: Secondary | ICD-10-CM | POA: Diagnosis not present

## 2021-10-15 DIAGNOSIS — I442 Atrioventricular block, complete: Secondary | ICD-10-CM | POA: Diagnosis not present

## 2021-10-15 DIAGNOSIS — N186 End stage renal disease: Secondary | ICD-10-CM | POA: Diagnosis not present

## 2021-10-15 DIAGNOSIS — I428 Other cardiomyopathies: Secondary | ICD-10-CM | POA: Diagnosis not present

## 2021-10-15 DIAGNOSIS — I959 Hypotension, unspecified: Secondary | ICD-10-CM | POA: Diagnosis not present

## 2021-10-15 DIAGNOSIS — K219 Gastro-esophageal reflux disease without esophagitis: Secondary | ICD-10-CM | POA: Diagnosis not present

## 2021-10-15 DIAGNOSIS — D519 Vitamin B12 deficiency anemia, unspecified: Secondary | ICD-10-CM | POA: Diagnosis not present

## 2021-10-16 ENCOUNTER — Ambulatory Visit: Payer: Medicare Other | Admitting: Cardiovascular Disease

## 2021-10-17 DIAGNOSIS — M109 Gout, unspecified: Secondary | ICD-10-CM | POA: Diagnosis not present

## 2021-10-17 DIAGNOSIS — G4733 Obstructive sleep apnea (adult) (pediatric): Secondary | ICD-10-CM | POA: Diagnosis not present

## 2021-10-17 DIAGNOSIS — I442 Atrioventricular block, complete: Secondary | ICD-10-CM | POA: Diagnosis not present

## 2021-10-17 DIAGNOSIS — E7800 Pure hypercholesterolemia, unspecified: Secondary | ICD-10-CM | POA: Diagnosis not present

## 2021-10-17 DIAGNOSIS — I5043 Acute on chronic combined systolic (congestive) and diastolic (congestive) heart failure: Secondary | ICD-10-CM | POA: Diagnosis not present

## 2021-10-17 DIAGNOSIS — B3731 Acute candidiasis of vulva and vagina: Secondary | ICD-10-CM | POA: Diagnosis not present

## 2021-10-17 DIAGNOSIS — M1990 Unspecified osteoarthritis, unspecified site: Secondary | ICD-10-CM | POA: Diagnosis not present

## 2021-10-17 DIAGNOSIS — K589 Irritable bowel syndrome without diarrhea: Secondary | ICD-10-CM | POA: Diagnosis not present

## 2021-10-17 DIAGNOSIS — M545 Low back pain, unspecified: Secondary | ICD-10-CM | POA: Diagnosis not present

## 2021-10-17 DIAGNOSIS — D696 Thrombocytopenia, unspecified: Secondary | ICD-10-CM | POA: Diagnosis not present

## 2021-10-17 DIAGNOSIS — J45909 Unspecified asthma, uncomplicated: Secondary | ICD-10-CM | POA: Diagnosis not present

## 2021-10-17 DIAGNOSIS — I959 Hypotension, unspecified: Secondary | ICD-10-CM | POA: Diagnosis not present

## 2021-10-17 DIAGNOSIS — K760 Fatty (change of) liver, not elsewhere classified: Secondary | ICD-10-CM | POA: Diagnosis not present

## 2021-10-17 DIAGNOSIS — K219 Gastro-esophageal reflux disease without esophagitis: Secondary | ICD-10-CM | POA: Diagnosis not present

## 2021-10-17 DIAGNOSIS — I2510 Atherosclerotic heart disease of native coronary artery without angina pectoris: Secondary | ICD-10-CM | POA: Diagnosis not present

## 2021-10-17 DIAGNOSIS — I428 Other cardiomyopathies: Secondary | ICD-10-CM | POA: Diagnosis not present

## 2021-10-17 DIAGNOSIS — J9621 Acute and chronic respiratory failure with hypoxia: Secondary | ICD-10-CM | POA: Diagnosis not present

## 2021-10-17 DIAGNOSIS — I480 Paroxysmal atrial fibrillation: Secondary | ICD-10-CM | POA: Diagnosis not present

## 2021-10-17 DIAGNOSIS — D519 Vitamin B12 deficiency anemia, unspecified: Secondary | ICD-10-CM | POA: Diagnosis not present

## 2021-10-17 DIAGNOSIS — N186 End stage renal disease: Secondary | ICD-10-CM | POA: Diagnosis not present

## 2021-10-17 DIAGNOSIS — G8929 Other chronic pain: Secondary | ICD-10-CM | POA: Diagnosis not present

## 2021-10-17 DIAGNOSIS — I132 Hypertensive heart and chronic kidney disease with heart failure and with stage 5 chronic kidney disease, or end stage renal disease: Secondary | ICD-10-CM | POA: Diagnosis not present

## 2021-10-17 DIAGNOSIS — E1122 Type 2 diabetes mellitus with diabetic chronic kidney disease: Secondary | ICD-10-CM | POA: Diagnosis not present

## 2021-10-20 DIAGNOSIS — J9611 Chronic respiratory failure with hypoxia: Secondary | ICD-10-CM | POA: Diagnosis not present

## 2021-10-20 DIAGNOSIS — J45909 Unspecified asthma, uncomplicated: Secondary | ICD-10-CM | POA: Diagnosis not present

## 2021-10-20 DIAGNOSIS — I5042 Chronic combined systolic (congestive) and diastolic (congestive) heart failure: Secondary | ICD-10-CM | POA: Diagnosis not present

## 2021-10-20 DIAGNOSIS — L89322 Pressure ulcer of left buttock, stage 2: Secondary | ICD-10-CM | POA: Diagnosis not present

## 2021-10-20 DIAGNOSIS — I132 Hypertensive heart and chronic kidney disease with heart failure and with stage 5 chronic kidney disease, or end stage renal disease: Secondary | ICD-10-CM | POA: Diagnosis not present

## 2021-10-20 DIAGNOSIS — N186 End stage renal disease: Secondary | ICD-10-CM | POA: Diagnosis not present

## 2021-10-20 DIAGNOSIS — S22079D Unspecified fracture of T9-T10 vertebra, subsequent encounter for fracture with routine healing: Secondary | ICD-10-CM | POA: Diagnosis not present

## 2021-10-20 DIAGNOSIS — D519 Vitamin B12 deficiency anemia, unspecified: Secondary | ICD-10-CM | POA: Diagnosis not present

## 2021-10-20 DIAGNOSIS — E1122 Type 2 diabetes mellitus with diabetic chronic kidney disease: Secondary | ICD-10-CM | POA: Diagnosis not present

## 2021-10-20 DIAGNOSIS — G8929 Other chronic pain: Secondary | ICD-10-CM | POA: Diagnosis not present

## 2021-10-20 DIAGNOSIS — I2510 Atherosclerotic heart disease of native coronary artery without angina pectoris: Secondary | ICD-10-CM | POA: Diagnosis not present

## 2021-10-20 DIAGNOSIS — I480 Paroxysmal atrial fibrillation: Secondary | ICD-10-CM | POA: Diagnosis not present

## 2021-10-21 DIAGNOSIS — N186 End stage renal disease: Secondary | ICD-10-CM | POA: Diagnosis not present

## 2021-10-21 DIAGNOSIS — I2510 Atherosclerotic heart disease of native coronary artery without angina pectoris: Secondary | ICD-10-CM | POA: Diagnosis not present

## 2021-10-21 DIAGNOSIS — I5042 Chronic combined systolic (congestive) and diastolic (congestive) heart failure: Secondary | ICD-10-CM | POA: Diagnosis not present

## 2021-10-21 DIAGNOSIS — G8929 Other chronic pain: Secondary | ICD-10-CM | POA: Diagnosis not present

## 2021-10-21 DIAGNOSIS — D519 Vitamin B12 deficiency anemia, unspecified: Secondary | ICD-10-CM | POA: Diagnosis not present

## 2021-10-21 DIAGNOSIS — I959 Hypotension, unspecified: Secondary | ICD-10-CM | POA: Diagnosis not present

## 2021-10-21 DIAGNOSIS — I132 Hypertensive heart and chronic kidney disease with heart failure and with stage 5 chronic kidney disease, or end stage renal disease: Secondary | ICD-10-CM | POA: Diagnosis not present

## 2021-10-21 DIAGNOSIS — K589 Irritable bowel syndrome without diarrhea: Secondary | ICD-10-CM | POA: Diagnosis not present

## 2021-10-21 DIAGNOSIS — M109 Gout, unspecified: Secondary | ICD-10-CM | POA: Diagnosis not present

## 2021-10-21 DIAGNOSIS — E1122 Type 2 diabetes mellitus with diabetic chronic kidney disease: Secondary | ICD-10-CM | POA: Diagnosis not present

## 2021-10-21 DIAGNOSIS — K219 Gastro-esophageal reflux disease without esophagitis: Secondary | ICD-10-CM | POA: Diagnosis not present

## 2021-10-21 DIAGNOSIS — E7800 Pure hypercholesterolemia, unspecified: Secondary | ICD-10-CM | POA: Diagnosis not present

## 2021-10-21 DIAGNOSIS — I480 Paroxysmal atrial fibrillation: Secondary | ICD-10-CM | POA: Diagnosis not present

## 2021-10-21 DIAGNOSIS — J9611 Chronic respiratory failure with hypoxia: Secondary | ICD-10-CM | POA: Diagnosis not present

## 2021-10-21 DIAGNOSIS — G4733 Obstructive sleep apnea (adult) (pediatric): Secondary | ICD-10-CM | POA: Diagnosis not present

## 2021-10-21 DIAGNOSIS — S22079D Unspecified fracture of T9-T10 vertebra, subsequent encounter for fracture with routine healing: Secondary | ICD-10-CM | POA: Diagnosis not present

## 2021-10-21 DIAGNOSIS — D696 Thrombocytopenia, unspecified: Secondary | ICD-10-CM | POA: Diagnosis not present

## 2021-10-21 DIAGNOSIS — L89322 Pressure ulcer of left buttock, stage 2: Secondary | ICD-10-CM | POA: Diagnosis not present

## 2021-10-21 DIAGNOSIS — J45909 Unspecified asthma, uncomplicated: Secondary | ICD-10-CM | POA: Diagnosis not present

## 2021-10-21 DIAGNOSIS — M1990 Unspecified osteoarthritis, unspecified site: Secondary | ICD-10-CM | POA: Diagnosis not present

## 2021-10-21 DIAGNOSIS — I442 Atrioventricular block, complete: Secondary | ICD-10-CM | POA: Diagnosis not present

## 2021-10-21 DIAGNOSIS — I428 Other cardiomyopathies: Secondary | ICD-10-CM | POA: Diagnosis not present

## 2021-10-23 ENCOUNTER — Telehealth: Payer: Self-pay | Admitting: Gastroenterology

## 2021-10-23 ENCOUNTER — Other Ambulatory Visit (HOSPITAL_COMMUNITY): Payer: Self-pay | Admitting: *Deleted

## 2021-10-23 ENCOUNTER — Telehealth: Payer: Self-pay | Admitting: Cardiovascular Disease

## 2021-10-23 DIAGNOSIS — N184 Chronic kidney disease, stage 4 (severe): Secondary | ICD-10-CM

## 2021-10-23 DIAGNOSIS — I25708 Atherosclerosis of coronary artery bypass graft(s), unspecified, with other forms of angina pectoris: Secondary | ICD-10-CM

## 2021-10-23 DIAGNOSIS — I5032 Chronic diastolic (congestive) heart failure: Secondary | ICD-10-CM

## 2021-10-23 MED ORDER — CLOPIDOGREL BISULFATE 75 MG PO TABS: 75.0000 mg | ORAL_TABLET | Freq: Every day | ORAL | 3 refills | Status: DC

## 2021-10-23 MED ORDER — SUCRALFATE 1 G PO TABS: 1.0000 g | ORAL_TABLET | Freq: Three times a day (TID) | ORAL | 1 refills | Status: DC

## 2021-10-23 MED ORDER — ROSUVASTATIN CALCIUM 20 MG PO TABS: 20.0000 mg | ORAL_TABLET | Freq: Every evening | ORAL | 3 refills | Status: DC

## 2021-10-23 MED ORDER — ISOSORBIDE MONONITRATE ER 30 MG PO TB24: 90.0000 mg | ORAL_TABLET | Freq: Every day | ORAL | 3 refills | Status: DC

## 2021-10-23 MED ORDER — FUROSEMIDE 80 MG PO TABS: 80.0000 mg | ORAL_TABLET | Freq: Two times a day (BID) | ORAL | 3 refills | Status: DC

## 2021-10-23 MED ORDER — POTASSIUM CHLORIDE ER 20 MEQ PO TBCR: 60.0000 meq | EXTENDED_RELEASE_TABLET | Freq: Every day | ORAL | 3 refills | Status: DC

## 2021-10-23 MED ORDER — METOLAZONE 2.5 MG PO TABS: 2.5000 mg | ORAL_TABLET | ORAL | 3 refills | Status: DC

## 2021-10-23 MED ORDER — ALLOPURINOL 100 MG PO TABS: 100.0000 mg | ORAL_TABLET | Freq: Every day | ORAL | 3 refills | Status: DC

## 2021-10-23 NOTE — Telephone Encounter (Signed)
Caryl Pina from [employer] called states patient needs a prescription order on SUCRALFATE 1 G. Requesting a call back on [phone].

## 2021-10-23 NOTE — Telephone Encounter (Signed)
Prescription sent to patient's pharmacy per patient's request. 

## 2021-10-23 NOTE — Telephone Encounter (Signed)
*  STAT* If patient is at the pharmacy, call can be transferred to refill team.   1. Which medications need to be refilled? (please list name of each medication and dose if known)   allopurinol (ZYLOPRIM) 100 MG tablet    clopidogrel (PLAVIX) 75 MG tablet    isosorbide mononitrate (IMDUR) 30 MG 24 hr tablet    rosuvastatin (CRESTOR) 20 MG tablet    2. Which pharmacy/location (including street and city if local pharmacy) is medication to be sent to?  [facility] - [place], [place] - [place] [address]. Do they need a 30 day or 90 day supply?  90 day   Pt now uses above pharmacy and needs prescriptions sent over

## 2021-10-23 NOTE — Telephone Encounter (Signed)
[name],  According to your last office visit note, you wanted patient to continue sucralfate. You did not prescribe this medication but patient does need a refill. Can we send to her pharmacy?

## 2021-10-24 DIAGNOSIS — G8929 Other chronic pain: Secondary | ICD-10-CM | POA: Diagnosis not present

## 2021-10-24 DIAGNOSIS — M109 Gout, unspecified: Secondary | ICD-10-CM | POA: Diagnosis not present

## 2021-10-24 DIAGNOSIS — G4733 Obstructive sleep apnea (adult) (pediatric): Secondary | ICD-10-CM | POA: Diagnosis not present

## 2021-10-24 DIAGNOSIS — I442 Atrioventricular block, complete: Secondary | ICD-10-CM | POA: Diagnosis not present

## 2021-10-24 DIAGNOSIS — I428 Other cardiomyopathies: Secondary | ICD-10-CM | POA: Diagnosis not present

## 2021-10-24 DIAGNOSIS — I2510 Atherosclerotic heart disease of native coronary artery without angina pectoris: Secondary | ICD-10-CM | POA: Diagnosis not present

## 2021-10-24 DIAGNOSIS — D696 Thrombocytopenia, unspecified: Secondary | ICD-10-CM | POA: Diagnosis not present

## 2021-10-24 DIAGNOSIS — D519 Vitamin B12 deficiency anemia, unspecified: Secondary | ICD-10-CM | POA: Diagnosis not present

## 2021-10-24 DIAGNOSIS — I959 Hypotension, unspecified: Secondary | ICD-10-CM | POA: Diagnosis not present

## 2021-10-24 DIAGNOSIS — K219 Gastro-esophageal reflux disease without esophagitis: Secondary | ICD-10-CM | POA: Diagnosis not present

## 2021-10-24 DIAGNOSIS — E7800 Pure hypercholesterolemia, unspecified: Secondary | ICD-10-CM | POA: Diagnosis not present

## 2021-10-24 DIAGNOSIS — I5042 Chronic combined systolic (congestive) and diastolic (congestive) heart failure: Secondary | ICD-10-CM | POA: Diagnosis not present

## 2021-10-24 DIAGNOSIS — J9611 Chronic respiratory failure with hypoxia: Secondary | ICD-10-CM | POA: Diagnosis not present

## 2021-10-24 DIAGNOSIS — I480 Paroxysmal atrial fibrillation: Secondary | ICD-10-CM | POA: Diagnosis not present

## 2021-10-24 DIAGNOSIS — I132 Hypertensive heart and chronic kidney disease with heart failure and with stage 5 chronic kidney disease, or end stage renal disease: Secondary | ICD-10-CM | POA: Diagnosis not present

## 2021-10-24 DIAGNOSIS — M1990 Unspecified osteoarthritis, unspecified site: Secondary | ICD-10-CM | POA: Diagnosis not present

## 2021-10-24 DIAGNOSIS — N186 End stage renal disease: Secondary | ICD-10-CM | POA: Diagnosis not present

## 2021-10-24 DIAGNOSIS — L89322 Pressure ulcer of left buttock, stage 2: Secondary | ICD-10-CM | POA: Diagnosis not present

## 2021-10-24 DIAGNOSIS — J45909 Unspecified asthma, uncomplicated: Secondary | ICD-10-CM | POA: Diagnosis not present

## 2021-10-24 DIAGNOSIS — S22079D Unspecified fracture of T9-T10 vertebra, subsequent encounter for fracture with routine healing: Secondary | ICD-10-CM | POA: Diagnosis not present

## 2021-10-24 DIAGNOSIS — K589 Irritable bowel syndrome without diarrhea: Secondary | ICD-10-CM | POA: Diagnosis not present

## 2021-10-24 DIAGNOSIS — E1122 Type 2 diabetes mellitus with diabetic chronic kidney disease: Secondary | ICD-10-CM | POA: Diagnosis not present

## 2021-10-25 MED ORDER — SUCRALFATE 1 G PO TABS
1.0000 g | ORAL_TABLET | Freq: Three times a day (TID) | ORAL | 1 refills | Status: DC
Start: 2021-10-25 — End: 2021-12-14

## 2021-10-25 NOTE — Telephone Encounter (Signed)
Prescription resent to patient's pharmacy. 

## 2021-10-25 NOTE — Addendum Note (Signed)
Addended by: Dorisann Frames L on: 10/25/2021 01:37 PM   Modules accepted: Orders

## 2021-10-25 NOTE — Telephone Encounter (Signed)
[facility] states they haven't received rx as of today. I told her it was sent on the 13th, please advise.

## 2021-10-28 ENCOUNTER — Telehealth (HOSPITAL_COMMUNITY): Payer: Self-pay

## 2021-10-28 ENCOUNTER — Telehealth: Payer: Self-pay | Admitting: Cardiovascular Disease

## 2021-10-28 NOTE — Telephone Encounter (Signed)
Called [place] office back to review note from MD.   Office verbalized understanding.

## 2021-10-28 NOTE — Telephone Encounter (Signed)
[name] is not on prednisone for any cardiac indication.  From my point of view the medication can be stopped.   However, I would advise caution with its discontinuation since she has had previous serious problems with iatrogenic adrenal insufficiency, including shock.  She has been on steroids off and on for the better part of the last 10 years.  The medication should be weaned off very gradually.

## 2021-10-28 NOTE — Telephone Encounter (Addendum)
Patients legs are exteremly swollen, [facility] never did circulation yellow fluid bleeding off and on hasn't missed any lasix legs are numb itch not purple no discoloration in toes but she thinks she hit them. Will follow up with [facility]

## 2021-10-28 NOTE — Telephone Encounter (Signed)
New Message:     Nonna from Dr Dorthy Cooler wants to know if Dr C wants patient to remain on Prednisone? When you call back please ask for [place] from [place].

## 2021-10-29 ENCOUNTER — Other Ambulatory Visit: Payer: Self-pay | Admitting: Cardiovascular Disease

## 2021-10-29 NOTE — Telephone Encounter (Signed)
Not a Cardiology decision

## 2021-10-29 NOTE — Telephone Encounter (Signed)
Trenton Gammon at [phone] that the decision regarding prednisone is not for cardiology. She said she will notify Kieth Brightly at [facility] office.

## 2021-10-29 NOTE — Progress Notes (Signed)
Remote pacemaker transmission.   

## 2021-10-29 NOTE — Telephone Encounter (Signed)
Kieth Brightly, pt coordinator for [place] office is calling to get clarification as to how to wean this patient off of this medication. Dosing directions are requested. Please advise. Okay to leave detailed message with assistant.

## 2021-10-30 DIAGNOSIS — G8929 Other chronic pain: Secondary | ICD-10-CM | POA: Diagnosis not present

## 2021-10-30 DIAGNOSIS — S22079D Unspecified fracture of T9-T10 vertebra, subsequent encounter for fracture with routine healing: Secondary | ICD-10-CM | POA: Diagnosis not present

## 2021-10-30 DIAGNOSIS — I480 Paroxysmal atrial fibrillation: Secondary | ICD-10-CM | POA: Diagnosis not present

## 2021-10-30 DIAGNOSIS — I2510 Atherosclerotic heart disease of native coronary artery without angina pectoris: Secondary | ICD-10-CM | POA: Diagnosis not present

## 2021-10-30 DIAGNOSIS — J45909 Unspecified asthma, uncomplicated: Secondary | ICD-10-CM | POA: Diagnosis not present

## 2021-10-30 DIAGNOSIS — L89322 Pressure ulcer of left buttock, stage 2: Secondary | ICD-10-CM | POA: Diagnosis not present

## 2021-10-30 DIAGNOSIS — G4733 Obstructive sleep apnea (adult) (pediatric): Secondary | ICD-10-CM | POA: Diagnosis not present

## 2021-10-30 DIAGNOSIS — I959 Hypotension, unspecified: Secondary | ICD-10-CM | POA: Diagnosis not present

## 2021-10-30 DIAGNOSIS — E7800 Pure hypercholesterolemia, unspecified: Secondary | ICD-10-CM | POA: Diagnosis not present

## 2021-10-30 DIAGNOSIS — I5042 Chronic combined systolic (congestive) and diastolic (congestive) heart failure: Secondary | ICD-10-CM | POA: Diagnosis not present

## 2021-10-30 DIAGNOSIS — K589 Irritable bowel syndrome without diarrhea: Secondary | ICD-10-CM | POA: Diagnosis not present

## 2021-10-30 DIAGNOSIS — D696 Thrombocytopenia, unspecified: Secondary | ICD-10-CM | POA: Diagnosis not present

## 2021-10-30 DIAGNOSIS — I442 Atrioventricular block, complete: Secondary | ICD-10-CM | POA: Diagnosis not present

## 2021-10-30 DIAGNOSIS — N186 End stage renal disease: Secondary | ICD-10-CM | POA: Diagnosis not present

## 2021-10-30 DIAGNOSIS — I428 Other cardiomyopathies: Secondary | ICD-10-CM | POA: Diagnosis not present

## 2021-10-30 DIAGNOSIS — J9611 Chronic respiratory failure with hypoxia: Secondary | ICD-10-CM | POA: Diagnosis not present

## 2021-10-30 DIAGNOSIS — K219 Gastro-esophageal reflux disease without esophagitis: Secondary | ICD-10-CM | POA: Diagnosis not present

## 2021-10-30 DIAGNOSIS — D519 Vitamin B12 deficiency anemia, unspecified: Secondary | ICD-10-CM | POA: Diagnosis not present

## 2021-10-30 DIAGNOSIS — E1122 Type 2 diabetes mellitus with diabetic chronic kidney disease: Secondary | ICD-10-CM | POA: Diagnosis not present

## 2021-10-30 DIAGNOSIS — I132 Hypertensive heart and chronic kidney disease with heart failure and with stage 5 chronic kidney disease, or end stage renal disease: Secondary | ICD-10-CM | POA: Diagnosis not present

## 2021-10-30 DIAGNOSIS — M109 Gout, unspecified: Secondary | ICD-10-CM | POA: Diagnosis not present

## 2021-10-30 DIAGNOSIS — M1990 Unspecified osteoarthritis, unspecified site: Secondary | ICD-10-CM | POA: Diagnosis not present

## 2021-10-31 DIAGNOSIS — I442 Atrioventricular block, complete: Secondary | ICD-10-CM | POA: Diagnosis not present

## 2021-10-31 DIAGNOSIS — K219 Gastro-esophageal reflux disease without esophagitis: Secondary | ICD-10-CM | POA: Diagnosis not present

## 2021-10-31 DIAGNOSIS — G8929 Other chronic pain: Secondary | ICD-10-CM | POA: Diagnosis not present

## 2021-10-31 DIAGNOSIS — I2510 Atherosclerotic heart disease of native coronary artery without angina pectoris: Secondary | ICD-10-CM | POA: Diagnosis not present

## 2021-10-31 DIAGNOSIS — K589 Irritable bowel syndrome without diarrhea: Secondary | ICD-10-CM | POA: Diagnosis not present

## 2021-10-31 DIAGNOSIS — I5042 Chronic combined systolic (congestive) and diastolic (congestive) heart failure: Secondary | ICD-10-CM | POA: Diagnosis not present

## 2021-10-31 DIAGNOSIS — N186 End stage renal disease: Secondary | ICD-10-CM | POA: Diagnosis not present

## 2021-10-31 DIAGNOSIS — I428 Other cardiomyopathies: Secondary | ICD-10-CM | POA: Diagnosis not present

## 2021-10-31 DIAGNOSIS — I959 Hypotension, unspecified: Secondary | ICD-10-CM | POA: Diagnosis not present

## 2021-10-31 DIAGNOSIS — D519 Vitamin B12 deficiency anemia, unspecified: Secondary | ICD-10-CM | POA: Diagnosis not present

## 2021-10-31 DIAGNOSIS — E7800 Pure hypercholesterolemia, unspecified: Secondary | ICD-10-CM | POA: Diagnosis not present

## 2021-10-31 DIAGNOSIS — M109 Gout, unspecified: Secondary | ICD-10-CM | POA: Diagnosis not present

## 2021-10-31 DIAGNOSIS — J45909 Unspecified asthma, uncomplicated: Secondary | ICD-10-CM | POA: Diagnosis not present

## 2021-10-31 DIAGNOSIS — M1990 Unspecified osteoarthritis, unspecified site: Secondary | ICD-10-CM | POA: Diagnosis not present

## 2021-10-31 DIAGNOSIS — E1122 Type 2 diabetes mellitus with diabetic chronic kidney disease: Secondary | ICD-10-CM | POA: Diagnosis not present

## 2021-10-31 DIAGNOSIS — G4733 Obstructive sleep apnea (adult) (pediatric): Secondary | ICD-10-CM | POA: Diagnosis not present

## 2021-10-31 DIAGNOSIS — S22079D Unspecified fracture of T9-T10 vertebra, subsequent encounter for fracture with routine healing: Secondary | ICD-10-CM | POA: Diagnosis not present

## 2021-10-31 DIAGNOSIS — I132 Hypertensive heart and chronic kidney disease with heart failure and with stage 5 chronic kidney disease, or end stage renal disease: Secondary | ICD-10-CM | POA: Diagnosis not present

## 2021-10-31 DIAGNOSIS — D696 Thrombocytopenia, unspecified: Secondary | ICD-10-CM | POA: Diagnosis not present

## 2021-10-31 DIAGNOSIS — J9611 Chronic respiratory failure with hypoxia: Secondary | ICD-10-CM | POA: Diagnosis not present

## 2021-10-31 DIAGNOSIS — L89322 Pressure ulcer of left buttock, stage 2: Secondary | ICD-10-CM | POA: Diagnosis not present

## 2021-10-31 DIAGNOSIS — I480 Paroxysmal atrial fibrillation: Secondary | ICD-10-CM | POA: Diagnosis not present

## 2021-11-01 ENCOUNTER — Inpatient Hospital Stay (HOSPITAL_COMMUNITY)
Admission: EM | Admit: 2021-11-01 | Discharge: 2021-11-05 | DRG: 291 | Disposition: A | Discharge status: Home Health | Payer: Medicare Other | Attending: Internal Medicine | Admitting: Internal Medicine

## 2021-11-01 ENCOUNTER — Other Ambulatory Visit: Payer: Self-pay

## 2021-11-01 ENCOUNTER — Emergency Department (HOSPITAL_COMMUNITY): Payer: Medicare Other

## 2021-11-01 ENCOUNTER — Encounter (HOSPITAL_COMMUNITY): Payer: Self-pay | Admitting: Emergency Medicine

## 2021-11-01 DIAGNOSIS — Z825 Family history of asthma and other chronic lower respiratory diseases: Secondary | ICD-10-CM

## 2021-11-01 DIAGNOSIS — Z951 Presence of aortocoronary bypass graft: Secondary | ICD-10-CM | POA: Diagnosis not present

## 2021-11-01 DIAGNOSIS — D649 Anemia, unspecified: Secondary | ICD-10-CM | POA: Diagnosis not present

## 2021-11-01 DIAGNOSIS — M109 Gout, unspecified: Secondary | ICD-10-CM | POA: Diagnosis present

## 2021-11-01 DIAGNOSIS — I248 Other forms of acute ischemic heart disease: Secondary | ICD-10-CM | POA: Diagnosis not present

## 2021-11-01 DIAGNOSIS — I2510 Atherosclerotic heart disease of native coronary artery without angina pectoris: Secondary | ICD-10-CM | POA: Diagnosis present

## 2021-11-01 DIAGNOSIS — E669 Obesity, unspecified: Secondary | ICD-10-CM | POA: Diagnosis present

## 2021-11-01 DIAGNOSIS — Z79899 Other long term (current) drug therapy: Secondary | ICD-10-CM | POA: Diagnosis not present

## 2021-11-01 DIAGNOSIS — E7800 Pure hypercholesterolemia, unspecified: Secondary | ICD-10-CM | POA: Diagnosis not present

## 2021-11-01 DIAGNOSIS — E876 Hypokalemia: Secondary | ICD-10-CM | POA: Diagnosis present

## 2021-11-01 DIAGNOSIS — N184 Chronic kidney disease, stage 4 (severe): Secondary | ICD-10-CM | POA: Diagnosis not present

## 2021-11-01 DIAGNOSIS — L89152 Pressure ulcer of sacral region, stage 2: Secondary | ICD-10-CM | POA: Diagnosis present

## 2021-11-01 DIAGNOSIS — I130 Hypertensive heart and chronic kidney disease with heart failure and stage 1 through stage 4 chronic kidney disease, or unspecified chronic kidney disease: Secondary | ICD-10-CM | POA: Diagnosis not present

## 2021-11-01 DIAGNOSIS — K219 Gastro-esophageal reflux disease without esophagitis: Secondary | ICD-10-CM | POA: Diagnosis not present

## 2021-11-01 DIAGNOSIS — Z7902 Long term (current) use of antithrombotics/antiplatelets: Secondary | ICD-10-CM

## 2021-11-01 DIAGNOSIS — S80821A Blister (nonthermal), right lower leg, initial encounter: Secondary | ICD-10-CM | POA: Diagnosis not present

## 2021-11-01 DIAGNOSIS — Z8249 Family history of ischemic heart disease and other diseases of the circulatory system: Secondary | ICD-10-CM | POA: Diagnosis not present

## 2021-11-01 DIAGNOSIS — I872 Venous insufficiency (chronic) (peripheral): Secondary | ICD-10-CM | POA: Diagnosis not present

## 2021-11-01 DIAGNOSIS — I5033 Acute on chronic diastolic (congestive) heart failure: Secondary | ICD-10-CM | POA: Diagnosis present

## 2021-11-01 DIAGNOSIS — Z20822 Contact with and (suspected) exposure to covid-19: Secondary | ICD-10-CM | POA: Diagnosis present

## 2021-11-01 DIAGNOSIS — R778 Other specified abnormalities of plasma proteins: Secondary | ICD-10-CM

## 2021-11-01 DIAGNOSIS — I5043 Acute on chronic combined systolic (congestive) and diastolic (congestive) heart failure: Secondary | ICD-10-CM | POA: Diagnosis present

## 2021-11-01 DIAGNOSIS — Z7982 Long term (current) use of aspirin: Secondary | ICD-10-CM

## 2021-11-01 DIAGNOSIS — Z95810 Presence of automatic (implantable) cardiac defibrillator: Secondary | ICD-10-CM

## 2021-11-01 DIAGNOSIS — R0602 Shortness of breath: Secondary | ICD-10-CM

## 2021-11-01 DIAGNOSIS — Z6838 Body mass index (BMI) 38.0-38.9, adult: Secondary | ICD-10-CM | POA: Diagnosis not present

## 2021-11-01 DIAGNOSIS — R609 Edema, unspecified: Secondary | ICD-10-CM | POA: Diagnosis not present

## 2021-11-01 DIAGNOSIS — I5041 Acute combined systolic (congestive) and diastolic (congestive) heart failure: Secondary | ICD-10-CM | POA: Diagnosis present

## 2021-11-01 DIAGNOSIS — D631 Anemia in chronic kidney disease: Secondary | ICD-10-CM | POA: Diagnosis present

## 2021-11-01 DIAGNOSIS — L0390 Cellulitis, unspecified: Secondary | ICD-10-CM | POA: Diagnosis not present

## 2021-11-01 DIAGNOSIS — Z8673 Personal history of transient ischemic attack (TIA), and cerebral infarction without residual deficits: Secondary | ICD-10-CM

## 2021-11-01 DIAGNOSIS — Z833 Family history of diabetes mellitus: Secondary | ICD-10-CM

## 2021-11-01 DIAGNOSIS — I509 Heart failure, unspecified: Secondary | ICD-10-CM | POA: Diagnosis not present

## 2021-11-01 DIAGNOSIS — D696 Thrombocytopenia, unspecified: Secondary | ICD-10-CM

## 2021-11-01 DIAGNOSIS — Z91041 Radiographic dye allergy status: Secondary | ICD-10-CM

## 2021-11-01 DIAGNOSIS — I10 Essential (primary) hypertension: Secondary | ICD-10-CM | POA: Diagnosis not present

## 2021-11-01 DIAGNOSIS — L89102 Pressure ulcer of unspecified part of back, stage 2: Secondary | ICD-10-CM

## 2021-11-01 DIAGNOSIS — R600 Localized edema: Secondary | ICD-10-CM | POA: Diagnosis not present

## 2021-11-01 DIAGNOSIS — K760 Fatty (change of) liver, not elsewhere classified: Secondary | ICD-10-CM | POA: Diagnosis present

## 2021-11-01 DIAGNOSIS — J9621 Acute and chronic respiratory failure with hypoxia: Secondary | ICD-10-CM | POA: Diagnosis not present

## 2021-11-01 DIAGNOSIS — E1122 Type 2 diabetes mellitus with diabetic chronic kidney disease: Secondary | ICD-10-CM | POA: Diagnosis present

## 2021-11-01 DIAGNOSIS — Z7951 Long term (current) use of inhaled steroids: Secondary | ICD-10-CM

## 2021-11-01 DIAGNOSIS — Z7952 Long term (current) use of systemic steroids: Secondary | ICD-10-CM

## 2021-11-01 DIAGNOSIS — R1084 Generalized abdominal pain: Secondary | ICD-10-CM | POA: Diagnosis not present

## 2021-11-01 DIAGNOSIS — Z87891 Personal history of nicotine dependence: Secondary | ICD-10-CM

## 2021-11-01 DIAGNOSIS — I70229 Atherosclerosis of native arteries of extremities with rest pain, unspecified extremity: Secondary | ICD-10-CM | POA: Diagnosis not present

## 2021-11-01 DIAGNOSIS — Z888 Allergy status to other drugs, medicaments and biological substances status: Secondary | ICD-10-CM

## 2021-11-01 DIAGNOSIS — Z91013 Allergy to seafood: Secondary | ICD-10-CM

## 2021-11-01 DIAGNOSIS — X58XXXA Exposure to other specified factors, initial encounter: Secondary | ICD-10-CM | POA: Diagnosis present

## 2021-11-01 LAB — URINALYSIS, ROUTINE W REFLEX MICROSCOPIC
Bilirubin Urine: NEGATIVE
Glucose, UA: NEGATIVE mg/dL
Hgb urine dipstick: NEGATIVE
Ketones, ur: NEGATIVE mg/dL
Nitrite: NEGATIVE
Protein, ur: NEGATIVE mg/dL
Specific Gravity, Urine: 1.013 (ref 1.005–1.030)
pH: 5 (ref 5.0–8.0)

## 2021-11-01 LAB — TROPONIN I (HIGH SENSITIVITY)
Troponin I (High Sensitivity): 69 ng/L — ABNORMAL HIGH (ref <18)
Troponin I (High Sensitivity): 64 ng/L — ABNORMAL HIGH (ref <18)
Troponin I (High Sensitivity): 59 ng/L — ABNORMAL HIGH (ref <18)

## 2021-11-01 LAB — COMPREHENSIVE METABOLIC PANEL
ALT: 22 U/L (ref 0–44)
AST: 20 U/L (ref 15–41)
Albumin: 3 g/dL — ABNORMAL LOW (ref 3.5–5.0)
Alkaline Phosphatase: 79 U/L (ref 38–126)
Anion gap: 12 (ref 5–15)
BUN: 86 mg/dL — ABNORMAL HIGH (ref 8–23)
CO2: 30 mmol/L (ref 22–32)
Calcium: 9.6 mg/dL (ref 8.9–10.3)
Chloride: 97 mmol/L — ABNORMAL LOW (ref 98–111)
Creatinine, Ser: 3.96 mg/dL — ABNORMAL HIGH (ref 0.44–1.00)
GFR, Estimated: 11 mL/min — ABNORMAL LOW (ref >60)
Glucose, Bld: 125 mg/dL — ABNORMAL HIGH (ref 70–99)
Potassium: 3.5 mmol/L (ref 3.5–5.1)
Sodium: 139 mmol/L (ref 135–145)
Total Bilirubin: 0.4 mg/dL (ref 0.3–1.2)
Total Protein: 5.8 g/dL — ABNORMAL LOW (ref 6.5–8.1)

## 2021-11-01 LAB — CBC
HCT: 28.9 % — ABNORMAL LOW (ref 36.0–46.0)
Hemoglobin: 8.6 g/dL — ABNORMAL LOW (ref 12.0–15.0)
MCH: 29.4 pg (ref 26.0–34.0)
MCHC: 29.8 g/dL — ABNORMAL LOW (ref 30.0–36.0)
MCV: 98.6 fL (ref 80.0–100.0)
Platelets: 132 10*3/uL — ABNORMAL LOW (ref 150–400)
RBC: 2.93 MIL/uL — ABNORMAL LOW (ref 3.87–5.11)
RDW: 16.6 % — ABNORMAL HIGH (ref 11.5–15.5)
WBC: 6.7 10*3/uL (ref 4.0–10.5)
nRBC: 0 % (ref 0.0–0.2)

## 2021-11-01 LAB — BRAIN NATRIURETIC PEPTIDE: B Natriuretic Peptide: 415.3 pg/mL — ABNORMAL HIGH (ref 0.0–100.0)

## 2021-11-01 LAB — HEMOGLOBIN A1C
Hgb A1c MFr Bld: 5.6 % (ref 4.8–5.6)
Mean Plasma Glucose: 114.02 mg/dL

## 2021-11-01 LAB — MRSA NEXT GEN BY PCR, NASAL: MRSA by PCR Next Gen: NOT DETECTED

## 2021-11-01 LAB — SARS CORONAVIRUS 2 BY RT PCR: SARS Coronavirus 2 by RT PCR: NEGATIVE

## 2021-11-01 MED ORDER — ROSUVASTATIN CALCIUM 20 MG PO TABS
20.0000 mg | ORAL_TABLET | Freq: Every evening | ORAL | Status: DC
  Administered 2021-11-01 – 2021-11-04 (×4): 20 mg via ORAL
  Filled 2021-11-01 (×4): qty 1

## 2021-11-01 MED ORDER — OXYCODONE-ACETAMINOPHEN 5-325 MG PO TABS
1.0000 | ORAL_TABLET | Freq: Four times a day (QID) | ORAL | Status: DC | PRN
  Filled 2021-11-01: qty 1

## 2021-11-01 MED ORDER — LIDOCAINE 0.5 % EX AERO: 1.0000 | INHALATION_SPRAY | Freq: Four times a day (QID) | CUTANEOUS | Status: DC | PRN

## 2021-11-01 MED ORDER — FUROSEMIDE 10 MG/ML IJ SOLN
60.0000 mg | Freq: Two times a day (BID) | INTRAMUSCULAR | Status: DC
  Administered 2021-11-01 – 2021-11-05 (×8): 60 mg via INTRAVENOUS
  Filled 2021-11-01 (×8): qty 6

## 2021-11-01 MED ORDER — CLOPIDOGREL BISULFATE 75 MG PO TABS
75.0000 mg | ORAL_TABLET | Freq: Every day | ORAL | Status: DC
  Administered 2021-11-02 – 2021-11-05 (×4): 75 mg via ORAL
  Filled 2021-11-01 (×4): qty 1

## 2021-11-01 MED ORDER — SODIUM CHLORIDE 0.9% FLUSH: 3.0000 mL | INTRAVENOUS | Status: DC | PRN

## 2021-11-01 MED ORDER — ALLOPURINOL 100 MG PO TABS
100.0000 mg | ORAL_TABLET | Freq: Every day | ORAL | Status: DC
  Administered 2021-11-02 – 2021-11-05 (×4): 100 mg via ORAL
  Filled 2021-11-01 (×4): qty 1

## 2021-11-01 MED ORDER — TRAMADOL HCL 50 MG PO TABS
50.0000 mg | ORAL_TABLET | Freq: Every day | ORAL | Status: DC | PRN
  Filled 2021-11-01: qty 1

## 2021-11-01 MED ORDER — FUROSEMIDE 10 MG/ML IJ SOLN
60.0000 mg | Freq: Once | INTRAMUSCULAR | Status: AC
  Administered 2021-11-01: 60 mg via INTRAVENOUS
  Filled 2021-11-01: qty 6

## 2021-11-01 MED ORDER — ASPIRIN 81 MG PO TBEC
81.0000 mg | DELAYED_RELEASE_TABLET | Freq: Every day | ORAL | Status: DC
  Administered 2021-11-02 – 2021-11-05 (×4): 81 mg via ORAL
  Filled 2021-11-01 (×4): qty 1

## 2021-11-01 MED ORDER — ACETAMINOPHEN 500 MG PO TABS: 1000.0000 mg | ORAL_TABLET | Freq: Three times a day (TID) | ORAL | Status: DC | PRN

## 2021-11-01 MED ORDER — POTASSIUM CHLORIDE CRYS ER 10 MEQ PO TBCR
60.0000 meq | EXTENDED_RELEASE_TABLET | Freq: Every day | ORAL | Status: DC
  Administered 2021-11-01 – 2021-11-05 (×5): 60 meq via ORAL
  Filled 2021-11-01 (×5): qty 6

## 2021-11-01 MED ORDER — ALBUTEROL SULFATE (2.5 MG/3ML) 0.083% IN NEBU: 2.5000 mg | INHALATION_SOLUTION | Freq: Four times a day (QID) | RESPIRATORY_TRACT | Status: DC | PRN

## 2021-11-01 MED ORDER — ONDANSETRON HCL 4 MG PO TABS: 4.0000 mg | ORAL_TABLET | Freq: Every day | ORAL | Status: DC | PRN

## 2021-11-01 MED ORDER — ONDANSETRON HCL 4 MG/2ML IJ SOLN: 4.0000 mg | Freq: Four times a day (QID) | INTRAMUSCULAR | Status: DC | PRN

## 2021-11-01 MED ORDER — PANTOPRAZOLE SODIUM 40 MG PO TBEC
40.0000 mg | DELAYED_RELEASE_TABLET | Freq: Two times a day (BID) | ORAL | Status: DC
  Administered 2021-11-01 – 2021-11-05 (×8): 40 mg via ORAL
  Filled 2021-11-01 (×7): qty 1

## 2021-11-01 MED ORDER — SUCRALFATE 1 G PO TABS
1.0000 g | ORAL_TABLET | Freq: Three times a day (TID) | ORAL | Status: DC
  Administered 2021-11-01 – 2021-11-05 (×16): 1 g via ORAL
  Filled 2021-11-01 (×18): qty 1

## 2021-11-01 MED ORDER — OXYCODONE-ACETAMINOPHEN 5-325 MG PO TABS
1.0000 | ORAL_TABLET | Freq: Four times a day (QID) | ORAL | Status: DC | PRN
  Administered 2021-11-01: 1 via ORAL

## 2021-11-01 MED ORDER — SODIUM CHLORIDE 0.9% FLUSH
3.0000 mL | Freq: Two times a day (BID) | INTRAVENOUS | Status: DC
  Administered 2021-11-01 – 2021-11-05 (×8): 3 mL via INTRAVENOUS

## 2021-11-01 MED ORDER — ALBUTEROL SULFATE HFA 108 (90 BASE) MCG/ACT IN AERS: 2.0000 | INHALATION_SPRAY | Freq: Four times a day (QID) | RESPIRATORY_TRACT | Status: DC | PRN

## 2021-11-01 MED ORDER — POLYETHYLENE GLYCOL 3350 17 G PO PACK: 17.0000 g | PACK | Freq: Every day | ORAL | Status: DC | PRN

## 2021-11-01 MED ORDER — MOMETASONE FURO-FORMOTEROL FUM 200-5 MCG/ACT IN AERO
2.0000 | INHALATION_SPRAY | Freq: Two times a day (BID) | RESPIRATORY_TRACT | Status: DC
  Administered 2021-11-02 – 2021-11-05 (×6): 2 via RESPIRATORY_TRACT
  Filled 2021-11-01: qty 8.8

## 2021-11-01 MED ORDER — SODIUM CHLORIDE 0.9 % IV SOLN: 250.0000 mL | INTRAVENOUS | Status: DC | PRN

## 2021-11-01 MED ORDER — HEPARIN SODIUM (PORCINE) 5000 UNIT/ML IJ SOLN
5000.0000 [IU] | Freq: Two times a day (BID) | INTRAMUSCULAR | Status: DC
  Administered 2021-11-01 – 2021-11-05 (×8): 5000 [IU] via SUBCUTANEOUS
  Filled 2021-11-01 (×8): qty 1

## 2021-11-01 MED ORDER — CYCLOSPORINE 0.05 % OP EMUL
1.0000 [drp] | Freq: Two times a day (BID) | OPHTHALMIC | Status: DC
  Administered 2021-11-01 – 2021-11-05 (×8): 1 [drp] via OPHTHALMIC
  Filled 2021-11-01 (×9): qty 30

## 2021-11-01 MED ORDER — HYDROXYZINE HCL 25 MG PO TABS: 25.0000 mg | ORAL_TABLET | Freq: Two times a day (BID) | ORAL | Status: DC | PRN

## 2021-11-01 MED ORDER — ACETAMINOPHEN 325 MG PO TABS
650.0000 mg | ORAL_TABLET | ORAL | Status: DC | PRN
  Administered 2021-11-02 – 2021-11-05 (×3): 650 mg via ORAL
  Filled 2021-11-01 (×3): qty 2

## 2021-11-01 MED ORDER — OXYMETAZOLINE HCL 0.05 % NA SOLN: 1.0000 | Freq: Two times a day (BID) | NASAL | Status: AC | PRN

## 2021-11-01 MED ORDER — SIMETHICONE 80 MG PO CHEW
80.0000 mg | CHEWABLE_TABLET | Freq: Four times a day (QID) | ORAL | Status: DC | PRN
  Administered 2021-11-02: 80 mg via ORAL
  Filled 2021-11-01: qty 1

## 2021-11-01 NOTE — ED Triage Notes (Signed)
Pt to ER via EMS from home with c/o increased exertional dyspnea over last 24 hours.  Pt has hx of CHF and has noted increased swelling to bilateral legs for last week.  Now noted with redness and blistering.

## 2021-11-01 NOTE — Progress Notes (Signed)
Heart Failure Navigator Progress Note  Assessed for Heart & Vascular TOC clinic readiness.  Patient does not meet criteria due to Advanced Heart failure patient Dr. Haroldine Laws.     Earnestine Leys, BSN, Clinical cytogeneticist Only

## 2021-11-01 NOTE — Progress Notes (Signed)
Pt states she is in extreme 10/10 pain in both of her legs and in her back. Tramadol was offered for pain but pt states it does not help. Paged MD for new order.

## 2021-11-01 NOTE — Consult Note (Signed)
WOC Nurse Consult Note: Reason for Consult: LE weeping and blister; sacrum  Wound type: Cellulitis with blistering bilateral LEs, new onset redness in the last few months per patient. New onset of blistering in the last couple of days. Has not worn compression stockings or had any type of compression therapy in the past  Gluteal cleft fissure/Stage 2 Pressure Injury MASD (moisture associated skin damage) Pressure Injury POA: Yes Measurement: Blister RLE: 3cm x 3cm x 0cm  Fissure/Stage 2: 2cm x 0.2cm x 0.1cm  Wound ZOX:WRUEAV filled blister/pink and moist area on the gluteal cleft and sacrum  Drainage (amount, consistency, odor) serous weeping bilateral LEs, none from the sacrum  Periwound: bilateral 3+ edema  Dressing procedure/placement/frequency: Single layer of xeroform to the RLE wound, cover with foam once blister ruptures, wrap with kerlix until blister ruptures.  Silicone foam to the apex of the gluteal cleft and sacrum change every 3 days.  Low air loss mattress for moisture management and pressure redistribution  Needs ABI to determine safety of therapeutic compression; if ABIs are normal could use same topical care and add Unna's boots bilaterally.  Inpatient Unna's boots are applied by the orthopedic techs and can be ordered by the MD if appropriate. Home care staff have not added compression due to no recent ABIs to determine safety.   Discussed POC with patient and bedside nurse.  Re consult if needed, will not follow at this time. Thanks  Burnie Therien [employer], RN,CWOCN, CNS, [place] [phone])

## 2021-11-01 NOTE — ED Provider Notes (Signed)
[facility] EMERGENCY DEPARTMENT Provider Note   CSN: [id number] Arrival date & time: 11/01/21  1145     History  Chief Complaint  Patient presents with   Shortness of Breath   Leg Swelling    [name] is a 76 y.o. female.  Pt with hx cad/cabg, chf, presents with increased sob and bilateral leg edema in the past week. Symptoms acute onset, moderate, persistent, slowly worse. Long standing orthopnea - unchanged. Uses home o2 3 liters. Denies new cough or uri symptoms. No fever or chills. No chest pain or discomfort. States compliant w home meds including lasix, and has tried metolazone without relief of symptoms. Pt feels is urinating normal amount. Denies fever or chills.   The history is provided by the patient, medical records and the EMS personnel.  Shortness of Breath Associated symptoms: no abdominal pain, no chest pain, no cough, no fever, no headaches, no neck pain, no rash, no sore throat and no vomiting        Home Medications Prior to Admission medications   Medication Sig Start Date End Date Taking? Authorizing Provider  ACCU-CHEK AVIVA PLUS test strip Check blood sugar twice daily 10/19/19   [provider]  Accu-Chek Softclix Lancets lancets Check blood sugar twice daily 08/10/19   [provider]  acetaminophen (TYLENOL) 500 MG tablet Take 1,000 mg by mouth 3 (three) times daily as needed (for pain or headaches).    [provider]  albuterol (PROVENTIL HFA;VENTOLIN HFA) 108 (90 Base) MCG/ACT inhaler Inhale 2 puffs into the lungs every 6 (six) hours as needed for wheezing or shortness of breath. 05/02/18   Norval Morton, MD  albuterol (PROVENTIL) (2.5 MG/3ML) 0.083% nebulizer solution Take 3 mLs (2.5 mg total) by nebulization every 6 (six) hours as needed for wheezing or shortness of breath. 05/02/18   Norval Morton, MD  Alcohol Swabs (B-D SINGLE USE SWABS REGULAR) PADS  08/10/19   [provider]   allopurinol (ZYLOPRIM) 100 MG tablet Take 1 tablet (100 mg total) by mouth daily. 10/23/21   Croitoru, [name], MD  aspirin EC 81 MG tablet Take 1 tablet (81 mg total) by mouth daily. 03/27/16   Erlene Quan, PA-C  budesonide-formoterol (SYMBICORT) 160-4.5 MCG/ACT inhaler Inhale 2 puffs into the lungs 2 (two) times daily.    [provider]  clopidogrel (PLAVIX) 75 MG tablet Take 1 tablet (75 mg total) by mouth daily. 10/23/21   Croitoru, [name], MD  cycloSPORINE (RESTASIS) 0.05 % ophthalmic emulsion Place 1 drop into both eyes 2 (two) times daily.    [provider]  EPINEPHrine 0.3 mg/0.3 mL IJ SOAJ injection Inject 0.3 mg into the muscle once as needed for anaphylaxis (severe allergic reaction).    [provider]  furosemide (LASIX) 80 MG tablet Take 1 tablet (80 mg total) by mouth 2 (two) times daily. 10/23/21   Clegg, Amy D, NP  hydrOXYzine (ATARAX) 25 MG tablet Take 25 mg by mouth 2 (two) times daily as needed for itching. 06/07/21   [provider]  isosorbide mononitrate (IMDUR) 30 MG 24 hr tablet Take 3 tablets (90 mg total) by mouth daily. 10/23/21   Croitoru, [name], MD  Lidocaine 0.5 % AERO Place 1 spray rectally every 6 (six) hours as needed (hemorrhoids). Preparation H rapid relief lidocaine spray    [provider]  metolazone (ZAROXOLYN) 2.5 MG tablet Take 1 tablet (2.5 mg total) by mouth once a week. AS NEEDED 10/23/21  Clegg, Amy D, NP  nitroGLYCERIN (NITROSTAT) 0.4 MG SL tablet Place 1 tablet (0.4 mg total) under the tongue every 5 (five) minutes as needed for chest pain. 11/13/16 03/18/22  Erlene Quan, PA-C  ondansetron (ZOFRAN) 4 MG tablet Take 4 mg by mouth daily as needed for nausea or vomiting. 07/24/21   [provider]  OXYGEN Inhale 2-3 L/min into the lungs continuous.    [provider]  oxymetazoline (AFRIN) 0.05 % nasal spray Place 1 spray into both nostrils 2 (two) times daily as needed for congestion.    [provider]  pantoprazole (PROTONIX) 40 MG tablet Take 1 tablet (40 mg total) by mouth 2 (two) times daily. 07/08/19   [name], MD  polyethylene glycol (MIRALAX / GLYCOLAX) 17 g packet Take 17 g by mouth daily as needed (constipation).    [provider]  Potassium Chloride ER 20 MEQ TBCR Take 60 mEq by mouth daily in the afternoon. 10/23/21   Clegg, Amy D, NP  predniSONE (DELTASONE) 10 MG tablet Take 1 tablet (10 mg total) by mouth daily with breakfast. 08/15/21   Caren Griffins, MD  predniSONE (DELTASONE) 10 MG tablet Take 10 mg by mouth daily with breakfast.    [provider]  rosuvastatin (CRESTOR) 20 MG tablet Take 1 tablet (20 mg total) by mouth every evening. NEED OV. 10/23/21   Croitoru, Dani Gobble, MD  simethicone (MYLICON) 80 MG chewable tablet Chew 80 mg by mouth 4 (four) times daily as needed for flatulence.    [provider]  sucralfate (CARAFATE) 1 g tablet Take 1 tablet (1 g total) by mouth 4 (four) times daily -  with meals and at bedtime. 10/25/21   Vladimir Crofts, PA-C  traMADol (ULTRAM) 50 MG tablet Take 50 mg by mouth daily as needed (pain). 06/07/21   [provider]  vitamin B-12 (CYANOCOBALAMIN) 1000 MCG tablet Take 1 tablet (1,000 mcg total) by mouth daily. 08/15/21   Caren Griffins, MD      Allergies    Ivp dye [iodinated contrast media], Shellfish allergy, Sulfa antibiotics, Iodine, Atorvastatin, Benadryl [diphenhydramine], Colchicine, Doxycycline, Uloric [febuxostat], Cephalexin, and Zithromax [azithromycin]    Review of Systems   Review of Systems  Constitutional:  Negative for chills and fever.  HENT:  Negative for sore throat.   Eyes:  Negative for redness.  Respiratory:  Positive for shortness of breath. Negative for cough.   Cardiovascular:  Positive for leg swelling. Negative for chest pain.  Gastrointestinal:  Negative for abdominal pain and vomiting.  Genitourinary:  Negative for decreased urine volume and  flank pain.  Musculoskeletal:  Negative for back pain and neck pain.  Skin:  Negative for rash.  Neurological:  Negative for headaches.  Hematological:  Does not bruise/bleed easily.  Psychiatric/Behavioral:  Negative for confusion.     Physical Exam Updated Vital Signs BP 115/66   Pulse 83   Temp 98.8 F (37.1 C) (Oral)   Resp 18   Ht 1.6 m ('5\' 3"'$ )   Wt 88.9 kg   SpO2 100%   BMI 34.72 kg/m  Physical Exam Vitals and nursing note reviewed.  Constitutional:      Appearance: Normal appearance. She is well-developed.  HENT:     Head: Atraumatic.     Nose: Nose normal.     Mouth/Throat:     Mouth: Mucous membranes are moist.  Eyes:     General: No scleral icterus.    Conjunctiva/sclera: Conjunctivae  normal.  Neck:     Trachea: No tracheal deviation.  Cardiovascular:     Rate and Rhythm: Normal rate and regular rhythm.     Pulses: Normal pulses.     Heart sounds: Normal heart sounds. No murmur heard.    No friction rub. No gallop.  Pulmonary:     Effort: Pulmonary effort is normal. No respiratory distress.     Breath sounds: Normal breath sounds.  Abdominal:     General: Bowel sounds are normal. There is no distension.     Palpations: Abdomen is soft.     Tenderness: There is no abdominal tenderness.  Genitourinary:    Comments: No cva tenderness.  Musculoskeletal:     Cervical back: Normal range of motion and neck supple. No rigidity. No muscular tenderness.     Right lower leg: Edema present.     Left lower leg: Edema present.     Comments: Symmetric mod-severe bil lower leg edema, with weeping of fluid through skin, fluid filled blister to skin of right anterior lower leg, and erythema to bil lower legs. Distal pulses palp.   Skin:    General: Skin is warm and dry.     Findings: No rash.  Neurological:     Mental Status: She is alert.     Comments: Alert, speech normal.   Psychiatric:        Mood and Affect: Mood normal.     ED Results / Procedures /  Treatments   Labs (all labs ordered are listed, but only abnormal results are displayed) Results for orders placed or performed during the hospital encounter of 11/01/21  SARS Coronavirus 2 by RT PCR (hospital order, performed in [place] hospital lab) *cepheid single result test* Anterior Nasal Swab   Specimen: Anterior Nasal Swab  Result Value Ref Range   SARS Coronavirus 2 by RT PCR NEGATIVE NEGATIVE  CBC  Result Value Ref Range   WBC 6.7 4.0 - 10.5 K/uL   RBC 2.93 (L) 3.87 - 5.11 MIL/uL   Hemoglobin 8.6 (L) 12.0 - 15.0 g/dL   HCT 28.9 (L) 36.0 - 46.0 %   MCV 98.6 80.0 - 100.0 fL   MCH 29.4 26.0 - 34.0 pg   MCHC 29.8 (L) 30.0 - 36.0 g/dL   RDW 16.6 (H) 11.5 - 15.5 %   Platelets 132 (L) 150 - 400 K/uL   nRBC 0.0 0.0 - 0.2 %  Comprehensive metabolic panel  Result Value Ref Range   Sodium 139 135 - 145 mmol/L   Potassium 3.5 3.5 - 5.1 mmol/L   Chloride 97 (L) 98 - 111 mmol/L   CO2 30 22 - 32 mmol/L   Glucose, Bld 125 (H) 70 - 99 mg/dL   BUN 86 (H) 8 - 23 mg/dL   Creatinine, Ser 3.96 (H) 0.44 - 1.00 mg/dL   Calcium 9.6 8.9 - 10.3 mg/dL   Total Protein 5.8 (L) 6.5 - 8.1 g/dL   Albumin 3.0 (L) 3.5 - 5.0 g/dL   AST 20 15 - 41 U/L   ALT 22 0 - 44 U/L   Alkaline Phosphatase 79 38 - 126 U/L   Total Bilirubin 0.4 0.3 - 1.2 mg/dL   GFR, Estimated 11 (L) >60 mL/min   Anion gap 12 5 - 15  Brain natriuretic peptide  Result Value Ref Range   B Natriuretic Peptide 415.3 (H) 0.0 - 100.0 pg/mL  Troponin I (High Sensitivity)  Result Value Ref Range   Troponin I (High  Sensitivity) 69 (H) <18 ng/L   *Note: Due to a large number of results and/or encounters for the requested time period, some results have not been displayed. A complete set of results can be found in Results Review.    EKG EKG Interpretation  Date/Time:  Friday November 01 2021 13:20:23 EDT Ventricular Rate:  75 PR Interval:  64 QRS Duration: 164 QT Interval:  444 QTC Calculation: 496 R Axis:   232 Text  Interpretation: Electronic ventricular pacemaker Confirmed by Lajean Saver [phone]) on 11/01/2021 1:26:38 PM  Radiology DG Chest Port 1 View  Result Date: 11/01/2021 CLINICAL DATA:  Shortness of breath EXAM: PORTABLE CHEST 1 VIEW COMPARISON:  09/09/2021 FINDINGS: Right-sided implanted cardiac device. Prior sternotomy and CABG. Stable cardiomegaly. Aortic atherosclerosis. No focal airspace consolidation, pleural effusion, or pneumothorax. New midthoracic fusion hardware. IMPRESSION: No acute cardiopulmonary findings. Stable cardiomegaly. Electronically Signed   By: Davina Poke D.O.   On: 11/01/2021 12:43    Procedures Procedures    Medications Ordered in ED Medications - No data to display  ED Course/ Medical Decision Making/ A&P                           Medical Decision Making Problems Addressed: Acute on chronic combined systolic and diastolic CHF (congestive heart failure) ([place]): acute illness or injury with systemic symptoms that poses a threat to life or bodily functions Chronic anemia: chronic illness or injury Chronic kidney disease (CKD), stage IV (severe) ([place]): chronic illness or injury with exacerbation, progression, or side effects of treatment that poses a threat to life or bodily functions Chronic venous stasis dermatitis: chronic illness or injury Elevated troponin: acute illness or injury Peripheral edema: acute illness or injury with systemic symptoms Shortness of breath: acute illness or injury with systemic symptoms that poses a threat to life or bodily functions Thrombocytopenia ([place]): chronic illness or injury  Amount and/or Complexity of Data Reviewed Independent Historian: EMS    Details: hx External Data Reviewed: notes. Labs: ordered. Decision-making details documented in ED Course. Radiology: ordered and independent interpretation performed. Decision-making details documented in ED Course. ECG/medicine tests: ordered. Discussion of management or test  interpretation with external provider(s): medicine  Risk Prescription drug management. Decision regarding hospitalization.   Iv ns. Continuous pulse ox and cardiac monitoring. Labs ordered/sent. Imaging ordered.   Reviewed nursing notes and prior charts for additional history. External reports reviewed. Additional history from: EMS.   Cardiac monitor: sinus rhythm, rate 84.  Labs reviewed/interpreted by me - bnp mildly high.   Xrays reviewed/interpreted by me - no edema.   Lasix iv.   Given increased edema, dyspnea, etc - will consult medicine for admission.           Final Clinical Impression(s) / ED Diagnoses Final diagnoses:  None    Rx / DC Orders ED Discharge Orders     None         Lajean Saver, MD 11/01/21 1335

## 2021-11-01 NOTE — H&P (Signed)
History and Physical    [name] TMH:[id number] DOB: 02-05-1946 DOA: 11/01/2021  PCP: Alicia Amel, MD (Confirm with patient/family/NH records and if not entered, this has to be entered at [facility] point of entry) Patient coming from: Home  I have personally briefly reviewed patient's old medical records in [place]  Chief Complaint: Swelling up, SOB  HPI: [name] is a 76 y.o. female with medical history significant of chronic HFpEF, CAD status post CABG in 2016, PPM, chronic hypoxic respiratory failure on 2.5-3 L continuously, CKD stage IV, OSA, asthma, chronic normocytic anemia secondary to CKD, HTN, IIDM, obesity, presented with worsening of lower extremity edema and increasing shortness of breath.  Patient started notice increasing of bilateral lower extremity edema about 3 weeks ago and went to see cardiology, when her Lasix was increased to 80 mg twice daily and also started on metolazone to boost diuresis.  Despite, this week, especially the last 4 days, patient experienced increasing of bilateral lower extremity swelling and she even developed a blister on right shin area, and worsening of shortness of breath.  Denies any cough no chest pain no fever chills.  Patient also reported that last week, her prednisone was weaned off.  ED Course: No tachycardia, blood pressure borderline low, saturation 90% on 3 L.  Chest x-ray no clear infiltrates, hemoglobin 8.6 above baseline, creatinine 3.9 compared to baseline 3.0-4.0, K3.5, glucose 125.  Patient was given one-time dose of Lasix 60 mg  Review of Systems: As per HPI otherwise 14 point review of systems negative.   Past Medical History:  Diagnosis Date   AICD (automatic cardioverter/defibrillator) present    downgraded to CRT-P in 2014   Anemia    Arthritis    Asthma    Atrial fibrillation ([place]) 10/24/2015   Questionable history of A Fib/A Flutter. On device interrogation on 9/7, showed 0.49mn of A Fib/A Flutter  with 1% burden.  Device check in Jan 2018 showed no sustained AF (runs of less than 30 seconds). No anticoagulation indicated.   CAD (coronary artery disease)    a. 10/09/16 LHC: SVG->LAD patent, SVG->Diag patent, SVG->RCA patent, SVG->LCx occluded. EF 60%, b. 10/31/16 LHC DES to AV groove Circ, DES to intermed branch   Chronic lower back pain    Fatty liver disease, nonalcoholic    GERD (gastroesophageal reflux disease)    Gout    H/O hiatal hernia    High cholesterol    Hyperplastic colon polyp    Hypertension    IBS (irritable bowel syndrome)    Internal hemorrhoids    INTERNAL HEMORRHOIDS WITHOUT MENTION COMP 04/12/2007   Qualifier: Diagnosis of  By: BOlevia PerchesMD, Dora M    [place]ischemic [place] 11/22/2011   Pt responded to BiV ICD- last EF 55-60% Sept 2017   Obesity    OSA (obstructive sleep apnea)    "can't tolerate a mask", wears 2L nocturnal O2   Presence of permanent cardiac pacemaker    11/02/12 [employer] V273 INTUA PPM   Stroke ([facility] 09/2020   Type II diabetes mellitus ([place]     Past Surgical History:  Procedure Laterality Date   ABDOMINAL ULTRASOUND  12/01/2011   Peripelvic cysts- #1- 2.4x1.9x2.3cm, #2-1.2x0.9x1.2cm   ANKLE FRACTURE SURGERY Right    "had rod put in"   ANTERIOR CERVICAL DECOMP/DISCECTOMY FUSION     APPENDECTOMY     BACK SURGERY     BIOPSY  12/29/2017   Procedure: BIOPSY;  Surgeon: NMauri Pole MD;  [facility];  Service: Endoscopy;;   BIV ICD INSERTION CRT-D  2001?   BIV PACEMAKER GENERATOR CHANGE OUT N/A 11/02/2012   Procedure: BIV PACEMAKER GENERATOR CHANGE OUT;  Surgeon: Sanda Klein, MD;  Location: [facility] CATH LAB;  Service: Cardiovascular;  Laterality: N/A;   CARDIAC CATHETERIZATION  05/17/1999   No significant coronary obstructive disease w/ mild 20% luminal irregularity of the first diag branch of the LAD   CARDIAC CATHETERIZATION  07/08/2002   No significant CAD, moderately depressed LV systolic function    CARDIAC CATHETERIZATION Bilateral 04/26/2007   Normal findings, recommend medical therapy   CARDIAC CATHETERIZATION  02/18/2008   Moderate CAD, would benefit from having a functional study, recommend continue medical therapy   CARDIAC CATHETERIZATION  07/23/2012   Medical therapy   CARDIAC CATHETERIZATION N/A 11/24/2014   Procedure: Left Heart Cath and Coronary Angiography;  Surgeon: Troy Sine, MD;  Location: [place] CV LAB;  Service: Cardiovascular;  Laterality: N/A;   CARDIAC CATHETERIZATION  11/27/2014   Procedure: Intravascular Pressure Wire/FFR Study;  Surgeon: Peter M Martinique, MD;  Location: [place] CV LAB;  Service: Cardiovascular;;   CARDIAC CATHETERIZATION  10/09/2016   CHOLECYSTECTOMY N/A 04/09/2018   Procedure: LAPAROSCOPIC CHOLECYSTECTOMY;  Surgeon: Greer Pickerel, MD;  [facility];  Service: General;  Laterality: N/A;   COLONOSCOPY WITH PROPOFOL N/A 12/29/2017   Procedure: COLONOSCOPY WITH PROPOFOL;  Surgeon: [name], MD;  [facility];  Service: Endoscopy;  Laterality: N/A;   CORONARY ANGIOGRAM  2010   CORONARY ARTERY BYPASS GRAFT N/A 11/29/2014   Procedure: CORONARY ARTERY BYPASS GRAFTING x 5 (LIMA-LAD, SVG-D, SVG-OM1-OM2, SVG-PD);  Surgeon: Melrose Nakayama, MD;  Location: [place];  Service: Open Heart Surgery;  Laterality: N/A;   CORONARY STENT INTERVENTION N/A 10/31/2016   Procedure: CORONARY STENT INTERVENTION;  Surgeon: Burnell Blanks, MD;  Location: [place] CV LAB;  Service: Cardiovascular;  Laterality: N/A;   ESOPHAGOGASTRODUODENOSCOPY (EGD) WITH PROPOFOL N/A 12/29/2017   Procedure: ESOPHAGOGASTRODUODENOSCOPY (EGD) WITH PROPOFOL;  Surgeon: [name], MD;  [facility];  Service: Endoscopy;  Laterality: N/A;   FRACTURE SURGERY     INSERT / REPLACE / REMOVE PACEMAKER  1999   INTRAMEDULLARY (IM) NAIL INTERTROCHANTERIC Left 04/04/2021   Procedure: INTRAMEDULLARY (IM) NAIL INTERTROCHANTRIC;  Surgeon: Vanetta Mulders, MD;  Location: [facility] [place];  Service: Orthopedics;  Laterality: Left;   IR FLUORO GUIDE CV LINE LEFT  04/11/2021   IR US GUIDE VASC ACCESS LEFT  04/11/2021   KNEE ARTHROSCOPY Bilateral    LEFT HEART CATH AND CORS/GRAFTS ANGIOGRAPHY N/A 12/09/2017   Procedure: LEFT HEART CATH AND CORS/GRAFTS ANGIOGRAPHY;  Surgeon: Troy Sine, MD;  Location: [place] CV LAB;  Service: Cardiovascular;  Laterality: N/A;   LEFT HEART CATHETERIZATION WITH CORONARY ANGIOGRAM N/A 07/23/2012   Procedure: LEFT HEART CATHETERIZATION WITH CORONARY ANGIOGRAM;  Surgeon: Leonie Man, MD;  Location: [facility] CATH LAB;  Service: Cardiovascular;  Laterality: N/A;   LEXISCAN MYOVIEW  11/14/2011   Mild-moderate defect seen in Mid Inferolateral and Mid Anterolateral regions-consistant w/ infarct/scar. No significant ischemia demonstrated.   LUMBAR PERCUTANEOUS PEDICLE SCREW 4 LEVEL N/A 09/10/2021   Procedure: THORACIC SEVEN THROUGH THORACIC ELEVEN  PERCUTANEOUS PEDICLE SCREW PLACEMENT;  Surgeon: Judith Part, MD;  Location: [place];  Service: Neurosurgery;  Laterality: N/A;   POLYPECTOMY  12/29/2017   Procedure: POLYPECTOMY;  Surgeon: [name], MD;  [facility];  Service: Endoscopy;;   PPM GENERATOR CHANGEOUT N/A 12/31/2020  Procedure: PPM GENERATOR CHANGEOUT;  Surgeon: Sanda Klein, MD;  Location: [place] CV LAB;  Service: Cardiovascular;  Laterality: N/A;   RIGHT/LEFT HEART CATH AND CORONARY ANGIOGRAPHY N/A 10/09/2016   Procedure: RIGHT/LEFT HEART CATH AND CORONARY ANGIOGRAPHY;  Surgeon: Jolaine Artist, MD;  Location: [place] CV LAB;  Service: Cardiovascular;  Laterality: N/A;   RIGHT/LEFT HEART CATH AND CORONARY/GRAFT ANGIOGRAPHY N/A 03/11/2019   Procedure: RIGHT/LEFT HEART CATH AND CORONARY/GRAFT ANGIOGRAPHY;  Surgeon: Jolaine Artist, MD;  Location: [place] CV LAB;  Service: Cardiovascular;  Laterality: N/A;   TEE WITHOUT CARDIOVERSION N/A 11/29/2014   Procedure: TRANSESOPHAGEAL  ECHOCARDIOGRAM (TEE);  Surgeon: Melrose Nakayama, MD;  Location: [place];  Service: Open Heart Surgery;  Laterality: N/A;   TRANSCAROTID ARTERY REVASCULARIZATION  Right 10/01/2020   Procedure: RIGHT TRANSCAROTID ARTERY REVASCULARIZATION;  Surgeon: [name], MD;  Location: [place];  Service: Vascular;  Laterality: Right;   TRANSTHORACIC ECHOCARDIOGRAM  07/23/2012   EF 55-60%, normal-mild   TUBAL LIGATION     ULTRASOUND GUIDANCE FOR VASCULAR ACCESS Left 10/01/2020   Procedure: ULTRASOUND GUIDANCE FOR VASCULAR ACCESS;  Surgeon: [name], MD;  Location: [place];  Service: Vascular;  Laterality: Left;     reports that she quit smoking about 53 years ago. Her smoking use included cigarettes. She has a 0.75 pack-year smoking history. She has never used smokeless tobacco. She reports that she does not drink alcohol and does not use drugs.  Allergies  Allergen Reactions   Ivp Dye [Iodinated Contrast Media] Shortness Of Breath    No reaction to PO contrast with non-ionic dye.06-25-2014/rsm   Shellfish Allergy Anaphylaxis   Sulfa Antibiotics Shortness Of Breath   Iodine Hives   Atorvastatin Other (See Comments)    Pt states "causes bilateral leg pain/cramps."   Benadryl [Diphenhydramine] Other (See Comments)    "THIS DRIVES ME CRAZY AND MAKES ME FEEL LIKE I AM DYING"    Colchicine Nausea And Vomiting   Doxycycline Other (See Comments)    Unknown reaction, per patient   Uloric [Febuxostat] Other (See Comments)    Unknown reaction   Cephalexin Itching and Rash   Zithromax [Azithromycin] Rash    Family History  Problem Relation Age of Onset   Breast cancer Mother    Diabetes Mother    Asthma Sister    Heart disease Maternal Grandmother    Kidney disease Maternal Grandmother    Diabetes Maternal Grandmother    Glaucoma Maternal Aunt    Heart disease Maternal Aunt    Colon cancer Neg Hx    Stomach cancer Neg Hx    Pancreatic cancer Neg Hx      Prior to Admission  medications   Medication Sig Start Date End Date Taking? Authorizing Provider  ACCU-CHEK AVIVA PLUS test strip Check blood sugar twice daily 10/19/19   [provider]  Accu-Chek Softclix Lancets lancets Check blood sugar twice daily 08/10/19   [provider]  acetaminophen (TYLENOL) 500 MG tablet Take 1,000 mg by mouth 3 (three) times daily as needed (for pain or headaches).    [provider]  albuterol (PROVENTIL HFA;VENTOLIN HFA) 108 (90 Base) MCG/ACT inhaler Inhale 2 puffs into the lungs every 6 (six) hours as needed for wheezing or shortness of breath. 05/02/18   Norval Morton, MD  albuterol (PROVENTIL) (2.5 MG/3ML) 0.083% nebulizer solution Take 3 mLs (2.5 mg total) by nebulization every 6 (six) hours as needed for wheezing or shortness of breath. 05/02/18  Norval Morton, MD  Alcohol Swabs (B-D SINGLE USE SWABS REGULAR) PADS  08/10/19   [provider]  allopurinol (ZYLOPRIM) 100 MG tablet Take 1 tablet (100 mg total) by mouth daily. 10/23/21   Croitoru, [name], MD  aspirin EC 81 MG tablet Take 1 tablet (81 mg total) by mouth daily. 03/27/16   Erlene Quan, PA-C  budesonide-formoterol (SYMBICORT) 160-4.5 MCG/ACT inhaler Inhale 2 puffs into the lungs 2 (two) times daily.    [provider]  clopidogrel (PLAVIX) 75 MG tablet Take 1 tablet (75 mg total) by mouth daily. 10/23/21   Croitoru, [name], MD  cycloSPORINE (RESTASIS) 0.05 % ophthalmic emulsion Place 1 drop into both eyes 2 (two) times daily.    [provider]  EPINEPHrine 0.3 mg/0.3 mL IJ SOAJ injection Inject 0.3 mg into the muscle once as needed for anaphylaxis (severe allergic reaction).    [provider]  furosemide (LASIX) 80 MG tablet Take 1 tablet (80 mg total) by mouth 2 (two) times daily. 10/23/21   Clegg, Amy D, NP  hydrOXYzine (ATARAX) 25 MG tablet Take 25 mg by mouth 2 (two) times daily as needed for itching. 06/07/21   [provider]  isosorbide  mononitrate (IMDUR) 30 MG 24 hr tablet Take 3 tablets (90 mg total) by mouth daily. 10/23/21   Croitoru, [name], MD  Lidocaine 0.5 % AERO Place 1 spray rectally every 6 (six) hours as needed (hemorrhoids). Preparation H rapid relief lidocaine spray    [provider]  metolazone (ZAROXOLYN) 2.5 MG tablet Take 1 tablet (2.5 mg total) by mouth once a week. AS NEEDED 10/23/21   Clegg, Amy D, NP  nitroGLYCERIN (NITROSTAT) 0.4 MG SL tablet Place 1 tablet (0.4 mg total) under the tongue every 5 (five) minutes as needed for chest pain. 11/13/16 03/18/22  Erlene Quan, PA-C  ondansetron (ZOFRAN) 4 MG tablet Take 4 mg by mouth daily as needed for nausea or vomiting. 07/24/21   [provider]  OXYGEN Inhale 2-3 L/min into the lungs continuous.    [provider]  oxymetazoline (AFRIN) 0.05 % nasal spray Place 1 spray into both nostrils 2 (two) times daily as needed for congestion.    [provider]  pantoprazole (PROTONIX) 40 MG tablet Take 1 tablet (40 mg total) by mouth 2 (two) times daily. 07/08/19   [name], MD  polyethylene glycol (MIRALAX / GLYCOLAX) 17 g packet Take 17 g by mouth daily as needed (constipation).    [provider]  Potassium Chloride ER 20 MEQ TBCR Take 60 mEq by mouth daily in the afternoon. 10/23/21   Clegg, Amy D, NP  predniSONE (DELTASONE) 10 MG tablet Take 1 tablet (10 mg total) by mouth daily with breakfast. 08/15/21   Caren Griffins, MD  predniSONE (DELTASONE) 10 MG tablet Take 10 mg by mouth daily with breakfast.    [provider]  rosuvastatin (CRESTOR) 20 MG tablet Take 1 tablet (20 mg total) by mouth every evening. NEED OV. 10/23/21   Croitoru, Dani Gobble, MD  simethicone (MYLICON) 80 MG chewable tablet Chew 80 mg by mouth 4 (four) times daily as needed for flatulence.    [provider]  sucralfate (CARAFATE) 1 g tablet Take 1 tablet (1 g total) by mouth 4 (four) times daily -  with meals and at bedtime. 10/25/21    Vladimir Crofts, PA-C  traMADol (ULTRAM) 50 MG tablet Take 50 mg by mouth daily as needed (pain). 06/07/21  [provider]  vitamin B-12 (CYANOCOBALAMIN) 1000 MCG tablet Take 1 tablet (1,000 mcg total) by mouth daily. 08/15/21   Caren Griffins, MD    Physical Exam: Vitals:   11/01/21 1149 11/01/21 1150 11/01/21 1230 11/01/21 1345  BP: 115/66  116/61 (!) 112/54  Pulse: 83  84 86  Resp: '18  18 16  '$ Temp: 98.8 F (37.1 C)     TempSrc: Oral     SpO2: 100%  100% 100%  Weight:  88.9 kg    Height:  '5\' 3"'$  (1.6 m)      Constitutional: NAD, calm, comfortable Vitals:   11/01/21 1149 11/01/21 1150 11/01/21 1230 11/01/21 1345  BP: 115/66  116/61 (!) 112/54  Pulse: 83  84 86  Resp: '18  18 16  '$ Temp: 98.8 F (37.1 C)     TempSrc: Oral     SpO2: 100%  100% 100%  Weight:  88.9 kg    Height:  '5\' 3"'$  (1.6 m)     Eyes: PERRL, lids and conjunctivae normal ENMT: Mucous membranes are moist. Posterior pharynx clear of any exudate or lesions.Normal dentition.  Neck: normal, supple, no masses, no thyromegaly Respiratory: clear to auscultation bilaterally, no wheezing, fine crackles on bilateral lower fields, increasing breathing effort. No accessory muscle use.  Cardiovascular: Regular rate and rhythm, no murmurs / rubs / gallops. No extremity edema. 2+ pedal pulses. No carotid bruits.  Abdomen: no tenderness, no masses palpated. No hepatosplenomegaly. Bowel sounds positive.  Musculoskeletal: no clubbing / cyanosis. No joint deformity upper and lower extremities. Good ROM, no contractures. Normal muscle tone.  Skin: no rashes, lesions, ulcers. No induration Neurologic: CN 2-12 grossly intact. Sensation intact, DTR normal. Strength 5/5 in all 4.  Psychiatric: Normal judgment and insight. Alert and oriented x 3. Normal mood.      Labs on Admission: I have personally reviewed following labs and imaging studies  CBC: Recent Labs  Lab 11/01/21 1214  WBC 6.7  HGB 8.6*  HCT 28.9*   MCV 98.6  PLT 397*   Basic Metabolic Panel: Recent Labs  Lab 11/01/21 1214  NA 139  K 3.5  CL 97*  CO2 30  GLUCOSE 125*  BUN 86*  CREATININE 3.96*  CALCIUM 9.6   GFR: Estimated Creatinine Clearance: 12.6 mL/min (A) (by C-G formula based on SCr of 3.96 mg/dL (H)). Liver Function Tests: Recent Labs  Lab 11/01/21 1214  AST 20  ALT 22  ALKPHOS 79  BILITOT 0.4  PROT 5.8*  ALBUMIN 3.0*   No results for input(s): "LIPASE", "AMYLASE" in the last 168 hours. No results for input(s): "AMMONIA" in the last 168 hours. Coagulation Profile: No results for input(s): "INR", "PROTIME" in the last 168 hours. Cardiac Enzymes: No results for input(s): "CKTOTAL", "CKMB", "CKMBINDEX", "TROPONINI" in the last 168 hours. BNP (last 3 results) No results for input(s): "PROBNP" in the last 8760 hours. HbA1C: No results for input(s): "HGBA1C" in the last 72 hours. CBG: No results for input(s): "GLUCAP" in the last 168 hours. Lipid Profile: No results for input(s): "CHOL", "HDL", "LDLCALC", "TRIG", "CHOLHDL", "LDLDIRECT" in the last 72 hours. Thyroid Function Tests: No results for input(s): "TSH", "T4TOTAL", "FREET4", "T3FREE", "THYROIDAB" in the last 72 hours. Anemia Panel: No results for input(s): "VITAMINB12", "FOLATE", "FERRITIN", "TIBC", "IRON", "RETICCTPCT" in the last 72 hours. Urine analysis:    Component Value Date/Time   COLORURINE STRAW (A) 09/09/2021 1644   APPEARANCEUR CLEAR 09/09/2021 1644   LABSPEC 1.006 09/09/2021 [place]  6.0 09/09/2021 1644   GLUCOSEU NEGATIVE 09/09/2021 1644   HGBUR NEGATIVE 09/09/2021 1644   BILIRUBINUR NEGATIVE 09/09/2021 1644   KETONESUR NEGATIVE 09/09/2021 1644   PROTEINUR NEGATIVE 09/09/2021 1644   UROBILINOGEN 0.2 12/06/2014 1531   NITRITE NEGATIVE 09/09/2021 1644   LEUKOCYTESUR NEGATIVE 09/09/2021 1644    Radiological Exams on Admission: DG Chest Port 1 View  Result Date: 11/01/2021 CLINICAL DATA:  Shortness of breath EXAM:  PORTABLE CHEST 1 VIEW COMPARISON:  09/09/2021 FINDINGS: Right-sided implanted cardiac device. Prior sternotomy and CABG. Stable cardiomegaly. Aortic atherosclerosis. No focal airspace consolidation, pleural effusion, or pneumothorax. New midthoracic fusion hardware. IMPRESSION: No acute cardiopulmonary findings. Stable cardiomegaly. Electronically Signed   By: Davina Poke D.O.   On: 11/01/2021 12:43    EKG: Independently reviewed.  Ventricular paced  Assessment/Plan Principal Problem:   CHF (congestive heart failure) (HCC) Active Problems:   Acute combined systolic and diastolic heart failure ([place])  (please populate well all problems here in Problem List. (For example, if patient is on BP meds at home and you resume or decide to hold them, it is a problem that needs to be her. Same for CAD, COPD, HLD and so on)  Acute on chronic HFpEF decompensation -Significant fluid overload -Made about 250 mL urine after 60 mg IV Lasix, plan to continue 60 mg IV Lasix twice daily, monitor kidney function daily. May need additional metolazone, pending on urine output in AM and consider routine cardiology consult. -Echocardiogram was done 3 months ago, will not repeat at this time.  Acute on chronic hypoxic respiratory failure -Secondary to fluid overload, diuresis as above.  Elevated troponins -No chest pains, suspect demand ischemia from CHF decompensation and CKD -Trend troponin level  Chronic sacral pressure ulcer, unstageable -Consult wound care  CAD s/p CABG -No chest pain -Elevated troponin level as discussed above. -On aspirin Plavix and statin  Hypokalemia -P.o. placement  CKD stage IV -Significant fluid overload, creatinine level stable compared to baseline -On IV Lasix 60 mg twice daily, follow-up daily kidney function  HTN -As patient on increasing dosage of Lasix, we will hold off Imdur for now.  IIDM -On diet control -Check A1C  Obesity -Calorie control  recommended  DVT prophylaxis: Heparin subcu Code Status: Full code Family Communication: None at bedside Disposition Plan: Patient sick with CHF decompensation failed outpatient management, expect more than 2 midnight hospital stay for IV diuresis Consults called: None Admission status: Tele admit   Lequita Halt MD [facility] Pager [phone]  11/01/2021, 2:56 PM

## 2021-11-01 NOTE — Plan of Care (Signed)
  Problem: Clinical Measurements: Goal: Cardiovascular complication will be avoided Outcome: Progressing   Problem: Nutrition: Goal: Adequate nutrition will be maintained Outcome: Progressing   Problem: Coping: Goal: Level of anxiety will decrease Outcome: Progressing   Problem: Elimination: Goal: Will not experience complications related to urinary retention Outcome: Progressing   Problem: Activity: Goal: Risk for activity intolerance will decrease Outcome: Not Progressing   Problem: Elimination: Goal: Will not experience complications related to bowel motility Outcome: Not Progressing   Problem: Pain Managment: Goal: General experience of comfort will improve Outcome: Not Progressing

## 2021-11-02 DIAGNOSIS — J9621 Acute and chronic respiratory failure with hypoxia: Secondary | ICD-10-CM | POA: Diagnosis not present

## 2021-11-02 DIAGNOSIS — I509 Heart failure, unspecified: Secondary | ICD-10-CM | POA: Diagnosis not present

## 2021-11-02 DIAGNOSIS — I5033 Acute on chronic diastolic (congestive) heart failure: Secondary | ICD-10-CM | POA: Diagnosis not present

## 2021-11-02 DIAGNOSIS — I10 Essential (primary) hypertension: Secondary | ICD-10-CM

## 2021-11-02 DIAGNOSIS — I5041 Acute combined systolic (congestive) and diastolic (congestive) heart failure: Secondary | ICD-10-CM | POA: Diagnosis not present

## 2021-11-02 LAB — BASIC METABOLIC PANEL
Anion gap: 11 (ref 5–15)
BUN: 79 mg/dL — ABNORMAL HIGH (ref 8–23)
CO2: 30 mmol/L (ref 22–32)
Calcium: 9.7 mg/dL (ref 8.9–10.3)
Chloride: 100 mmol/L (ref 98–111)
Creatinine, Ser: 3.5 mg/dL — ABNORMAL HIGH (ref 0.44–1.00)
GFR, Estimated: 13 mL/min — ABNORMAL LOW (ref >60)
Glucose, Bld: 97 mg/dL (ref 70–99)
Potassium: 4 mmol/L (ref 3.5–5.1)
Sodium: 141 mmol/L (ref 135–145)

## 2021-11-02 MED ORDER — PREDNISONE 10 MG PO TABS
10.0000 mg | ORAL_TABLET | Freq: Every day | ORAL | Status: DC
  Administered 2021-11-02 – 2021-11-05 (×4): 10 mg via ORAL
  Filled 2021-11-02 (×4): qty 1

## 2021-11-02 MED ORDER — POLYVINYL ALCOHOL 1.4 % OP SOLN
1.0000 [drp] | OPHTHALMIC | Status: DC | PRN
  Administered 2021-11-04: 1 [drp] via OPHTHALMIC
  Filled 2021-11-02: qty 15

## 2021-11-02 MED ORDER — PREDNISONE 10 MG PO TABS: 10.0000 mg | ORAL_TABLET | Freq: Every day | ORAL | Status: DC

## 2021-11-02 MED ORDER — HYDROCODONE-ACETAMINOPHEN 5-325 MG PO TABS: 1.0000 | ORAL_TABLET | Freq: Four times a day (QID) | ORAL | Status: DC | PRN

## 2021-11-02 MED ORDER — FLUTICASONE PROPIONATE 50 MCG/ACT NA SUSP
1.0000 | Freq: Every day | NASAL | Status: DC
  Administered 2021-11-02 – 2021-11-04 (×3): 1 via NASAL
  Filled 2021-11-02: qty 16

## 2021-11-02 NOTE — Progress Notes (Signed)
PROGRESS NOTE    [name]  BCW:[id number] DOB: 05-29-1945 DOA: 11/01/2021 PCP: Lujean Amel, MD    Brief Narrative:  [name] is a 76 y.o. female with medical history significant for chronic HFpEF, CAD status post CABG in 2016, PPM, chronic hypoxic respiratory failure on 2.5-3 L , CKD stage IV, OSA, asthma, chronic anemia, HTN, IIDM, obesity, and hospital with worsening lower extremity edema and shortness of breath for 3 weeks.  Of note she had recently seen her cardiologist and Lasix was increased to 80 twice daily with metolazone but for the last 4 days she was increasingly symptomatic with increased swelling and blisters.  In the ED chest x-ray was clear.  Creatinine was 3.9 compared to previous 3.024.  Patient was given one-time dose of IV 60 mg of Lasix and was admitted hospital for further evaluation and treatment.      Assessment and Plan: Principal Problem:   CHF (congestive heart failure) (HCC) Active Problems:   Obesity   HTN (hypertension), benign   Acute on chronic diastolic heart failure (HCC)   Acute combined systolic and diastolic heart failure (HCC)   Acute on chronic respiratory failure with hypoxia (HCC)   Acute on chronic HFpEF decompensation Presented with significant volume overload.  Continue IV Lasix 60 twice daily at this time.  Had 2D echocardiogram 3 months back.  Continue intake and output charting, daily weights.  Acute on chronic hypoxic respiratory failure Continue IV diuresis,   Elevated troponins/CAD s/p CABG Continue aspirin Plavix and statin No chest pain.  Likely demand ischemia from congestive heart failure and CKD.   Chronic sacral pressure ulcer, unstageable Continue wound care    Hypokalemia Replenished and improved.  We will continue to monitor while on diuretics.   CKD stage IV IV Lasix twice daily for now.  Strict intake and output charting Daily weights.  Essential hypertension. Lasix hydralazine Imdur at home.   Lasix at this time.   Diabetes mellitus type 2. Continue diet control, hemoglobin A1c at 5.6.   Obesity -Body mass index is 37.38 kg/m.  Would benefit from weight loss as outpatient.    DVT prophylaxis: heparin injection 5,000 Units Start: 11/01/21 2200   Code Status:     Code Status: Full Code  Disposition: Home with home health.  Status is: Inpatient  Remains inpatient appropriate because: IV diuresis, decompensated heart failure   Family Communication: None at bedside  Consultants:  None  Procedures:  None  Antimicrobials:  None  Anti-infectives (From admission, onward)    None      Subjective: Today, patient was seen and examined at bedside.  Because of shortness of breath, was able to breathe a little better.  Objective: Vitals:   11/02/21 0338 11/02/21 0802 11/02/21 1139 11/02/21 1143  BP: 122/65 97/66  (!) 114/55  Pulse: 74 84  86  Resp: '18 17  19  '$ Temp: 97.7 F (36.5 C) 97.9 F (36.6 C)  98 F (36.7 C)  TempSrc: Oral Oral  Oral  SpO2: 100% 100%  100%  Weight:   95.7 kg   Height:        Intake/Output Summary (Last 24 hours) at 11/02/2021 1422 Last data filed at 11/02/2021 1300 Gross per 24 hour  Intake 1200 ml  Output 2150 ml  Net -950 ml   Filed Weights   11/01/21 1150 11/02/21 1139  Weight: 88.9 kg 95.7 kg    Physical Examination: Body mass index is 37.38 kg/m.  General: Obese built,  not in obvious distress, on nasal cannula oxygen HENT:   No scleral pallor or icterus noted. Oral mucosa is moist.  Chest: .  Diminished breath sounds bilaterally.  Respirations nonlabored, coarse breath sounds. CVS: S1 &S2 heard. No murmur.  Regular rate and rhythm. Abdomen: Soft, nontender, nondistended.  Bowel sounds are heard.   Extremities: Bilateral lower extremity edema with erythema.  Blister in the right leg.  Dressing in place. Psych: Alert, awake and oriented, normal mood CNS:  No cranial nerve deficits.  Power equal in all extremities.    Skin: Warm and dry.  Dressing in the lower extremity.  Data Reviewed:   CBC: Recent Labs  Lab 11/01/21 1214  WBC 6.7  HGB 8.6*  HCT 28.9*  MCV 98.6  PLT 132*    Basic Metabolic Panel: Recent Labs  Lab 11/01/21 1214 11/02/21 0624  NA 139 141  K 3.5 4.0  CL 97* 100  CO2 30 30  GLUCOSE 125* 97  BUN 86* 79*  CREATININE 3.96* 3.50*  CALCIUM 9.6 9.7    Liver Function Tests: Recent Labs  Lab 11/01/21 1214  AST 20  ALT 22  ALKPHOS 79  BILITOT 0.4  PROT 5.8*  ALBUMIN 3.0*     Radiology Studies: DG Chest Port 1 View  Result Date: 11/01/2021 CLINICAL DATA:  Shortness of breath EXAM: PORTABLE CHEST 1 VIEW COMPARISON:  09/09/2021 FINDINGS: Right-sided implanted cardiac device. Prior sternotomy and CABG. Stable cardiomegaly. Aortic atherosclerosis. No focal airspace consolidation, pleural effusion, or pneumothorax. New midthoracic fusion hardware. IMPRESSION: No acute cardiopulmonary findings. Stable cardiomegaly. Electronically Signed   By: Davina Poke D.O.   On: 11/01/2021 12:43      LOS: 1 day    Flora Lipps, MD [facility] Available via Epic secure chat 7am-7pm After these hours, please refer to coverage provider listed on [email or web address] 11/02/2021, 2:22 PM

## 2021-11-03 DIAGNOSIS — I5041 Acute combined systolic (congestive) and diastolic (congestive) heart failure: Secondary | ICD-10-CM | POA: Diagnosis not present

## 2021-11-03 DIAGNOSIS — I509 Heart failure, unspecified: Secondary | ICD-10-CM | POA: Diagnosis not present

## 2021-11-03 DIAGNOSIS — L89102 Pressure ulcer of unspecified part of back, stage 2: Secondary | ICD-10-CM

## 2021-11-03 DIAGNOSIS — J9621 Acute and chronic respiratory failure with hypoxia: Secondary | ICD-10-CM | POA: Diagnosis not present

## 2021-11-03 DIAGNOSIS — I5033 Acute on chronic diastolic (congestive) heart failure: Secondary | ICD-10-CM | POA: Diagnosis not present

## 2021-11-03 LAB — BASIC METABOLIC PANEL
Anion gap: 10 (ref 5–15)
BUN: 75 mg/dL — ABNORMAL HIGH (ref 8–23)
CO2: 32 mmol/L (ref 22–32)
Calcium: 9.6 mg/dL (ref 8.9–10.3)
Chloride: 96 mmol/L — ABNORMAL LOW (ref 98–111)
Creatinine, Ser: 3.35 mg/dL — ABNORMAL HIGH (ref 0.44–1.00)
GFR, Estimated: 14 mL/min — ABNORMAL LOW (ref >60)
Glucose, Bld: 154 mg/dL — ABNORMAL HIGH (ref 70–99)
Potassium: 4.1 mmol/L (ref 3.5–5.1)
Sodium: 138 mmol/L (ref 135–145)

## 2021-11-03 MED ORDER — CYCLOBENZAPRINE HCL 10 MG PO TABS
5.0000 mg | ORAL_TABLET | Freq: Three times a day (TID) | ORAL | Status: DC | PRN
  Administered 2021-11-03 – 2021-11-04 (×2): 5 mg via ORAL
  Filled 2021-11-03 (×2): qty 1

## 2021-11-03 NOTE — Progress Notes (Signed)
PROGRESS NOTE    [name]  GYK:[id number] DOB: 1946/01/19 DOA: 11/01/2021 PCP: Lujean Amel, MD    Brief Narrative:  [name] is a 76 y.o. female with medical history significant for chronic HFpEF, CAD status post CABG in 2016, PPM, chronic hypoxic respiratory failure on 2.5-3 L , CKD stage IV, OSA, asthma, chronic anemia, HTN, IIDM, obesity, and hospital with worsening lower extremity edema and shortness of breath for 3 weeks.  Of note, she had recently seen her cardiologist and Lasix was increased to '80mg'$  twice daily with metolazone but for the last 4 days she was increasingly symptomatic with increased swelling and blisters.  In the ED, chest x-ray was clear.  Creatinine was 3.9 compared to previous 3.02.  Patient was given one-time dose of IV 60 mg of Lasix and was admitted hospital for further evaluation and treatment.     Assessment and Plan: Principal Problem:   CHF (congestive heart failure) (HCC) Active Problems:   Obesity   HTN (hypertension), benign   Acute on chronic diastolic heart failure (HCC)   Acute combined systolic and diastolic heart failure (HCC)   Acute on chronic respiratory failure with hypoxia (HCC)   Pressure injury of back, stage 2 ([place])   Acute on chronic HFpEF decompensation Presented with significant volume overload.  Continue IV Lasix 60 mg twice daily at this time.  Had 2D echocardiogram 3 months back.  Continue intake and output charting, daily weights.  Patient is negative balance with 2070 ml with  urine output of 1700 ml in 24 hours.  Creatinine Tolazine if needed for adequate diuresis.  Acute on chronic hypoxic respiratory failure Continue IV diuresis, currently on 3 L of oxygen.  Patient uses home oxygen at baseline.   Elevated troponins/CAD s/p CABG Continue aspirin Plavix and statin. No chest pain.  Likely demand ischemia from congestive heart failure and CKD.   Chronic sacral pressure ulcer, stage II, POA Pressure Injury  11/01/21 Sacrum Medial Stage 2 -  Partial thickness loss of dermis presenting as a shallow open injury with a red, pink wound bed without slough. fissure and associated area of superficial skin breakdown over sacrum (Active)  11/01/21 1434  Location: Sacrum  Location Orientation: Medial  Staging: Stage 2 -  Partial thickness loss of dermis presenting as a shallow open injury with a red, pink wound bed without slough.  Wound Description (Comments): fissure and associated area of superficial skin breakdown over sacrum  Present on Admission: Yes    Continue wound care    Hypokalemia Improved after replacement.  Latest potassium of 4.0.   CKD stage IV Continue IV Lasix twice daily for now.  Strict intake and output charting Daily weights.  Creatinine today at 3.5.  Essential hypertension. Patient takes Lasix hydralazine Imdur at home.  Continue Lasix at this time since borderline low blood pressure..   Diabetes mellitus type 2. Continue diet control, hemoglobin A1c at 5.6.   Obesity -Body mass index is 38.26 kg/m.  Would benefit from weight loss as outpatient.  Leg pain and swelling.  ABI pending.    DVT prophylaxis: heparin injection 5,000 Units Start: 11/01/21 2200   Code Status:     Code Status: Full Code  Disposition: Home with home health in 1 to 2 days.  Need PT evaluation.  Status is: Inpatient  Remains inpatient appropriate because: IV diuresis, decompensated heart failure   Family Communication:  None at bedside.  Consultants:  None  Procedures:  None  Antimicrobials:  None  Anti-infectives (From admission, onward)    None      Subjective: Today, patient was seen and examined at bedside.  Patient states that she feels similar to yesterday.  Pain on the back and legs.  Breathing might be a little better.   Objective: Vitals:   11/03/21 0316 11/03/21 0805 11/03/21 0851 11/03/21 1132  BP:  (!) 107/56  139/74  Pulse:  81 93 79  Resp:  '18 16 20   '$ Temp: 98 F (36.7 C) 98.2 F (36.8 C)  98.3 F (36.8 C)  TempSrc: Oral Oral  Oral  SpO2:  98% 100% 95%  Weight: 98 kg     Height:        Intake/Output Summary (Last 24 hours) at 11/03/2021 1528 Last data filed at 11/03/2021 1300 Gross per 24 hour  Intake 1080 ml  Output 2500 ml  Net -1420 ml   Filed Weights   11/01/21 1150 11/02/21 1139 11/03/21 0316  Weight: 88.9 kg 95.7 kg 98 kg    Physical Examination: Body mass index is 38.26 kg/m.  General: Obese built, not in obvious distress on 3 L nasal cannula oxygen HENT:   No scleral pallor or icterus noted. Oral mucosa is moist.  Chest:  .  Diminished breath sounds bilaterally. No crackles or wheezes.  CVS: S1 &S2 heard. No murmur.  Regular rate and rhythm. Abdomen: Soft, nontender, nondistended.  Bowel sounds are heard.   Extremities: No cyanosis, clubbing bilateral lower extremity edema and mild erythema.  Blister in the right leg covered with dressing.[name]   Psych: Alert, awake and oriented, normal mood CNS:  No cranial nerve deficits.  Moves all the extremities. Skin: Warm and dry. Blister in the right leg, dressing in place.  Data Reviewed:   CBC: Recent Labs  Lab 11/01/21 1214  WBC 6.7  HGB 8.6*  HCT 28.9*  MCV 98.6  PLT 132*    Basic Metabolic Panel: Recent Labs  Lab 11/01/21 1214 11/02/21 0624 11/03/21 1119  NA 139 141 138  K 3.5 4.0 4.1  CL 97* 100 96*  CO2 30 30 32  GLUCOSE 125* 97 154*  BUN 86* 79* 75*  CREATININE 3.96* 3.50* 3.35*  CALCIUM 9.6 9.7 9.6    Liver Function Tests: Recent Labs  Lab 11/01/21 1214  AST 20  ALT 22  ALKPHOS 79  BILITOT 0.4  PROT 5.8*  ALBUMIN 3.0*     Radiology Studies: No results found.    LOS: 2 days    Flora Lipps, MD [facility] Available via Epic secure chat 7am-7pm After these hours, please refer to coverage provider listed on [email or web address] 11/03/2021, 3:28 PM

## 2021-11-03 NOTE — Plan of Care (Signed)
  Problem: Clinical Measurements: Goal: Cardiovascular complication will be avoided Outcome: Progressing   Problem: Nutrition: Goal: Adequate nutrition will be maintained Outcome: Progressing   Problem: Elimination: Goal: Will not experience complications related to urinary retention Outcome: Progressing   Problem: Activity: Goal: Risk for activity intolerance will decrease Outcome: Not Progressing   Problem: Elimination: Goal: Will not experience complications related to bowel motility Outcome: Not Progressing   Problem: Pain Managment: Goal: General experience of comfort will improve Outcome: Not Progressing

## 2021-11-04 DIAGNOSIS — I5033 Acute on chronic diastolic (congestive) heart failure: Secondary | ICD-10-CM | POA: Diagnosis not present

## 2021-11-04 DIAGNOSIS — J9621 Acute and chronic respiratory failure with hypoxia: Secondary | ICD-10-CM | POA: Diagnosis not present

## 2021-11-04 DIAGNOSIS — I509 Heart failure, unspecified: Secondary | ICD-10-CM | POA: Diagnosis not present

## 2021-11-04 DIAGNOSIS — I5041 Acute combined systolic (congestive) and diastolic (congestive) heart failure: Secondary | ICD-10-CM | POA: Diagnosis not present

## 2021-11-04 LAB — CBC
HCT: 28.9 % — ABNORMAL LOW (ref 36.0–46.0)
Hemoglobin: 8.6 g/dL — ABNORMAL LOW (ref 12.0–15.0)
MCH: 28.6 pg (ref 26.0–34.0)
MCHC: 29.8 g/dL — ABNORMAL LOW (ref 30.0–36.0)
MCV: 96 fL (ref 80.0–100.0)
Platelets: 133 10*3/uL — ABNORMAL LOW (ref 150–400)
RBC: 3.01 MIL/uL — ABNORMAL LOW (ref 3.87–5.11)
RDW: 16.4 % — ABNORMAL HIGH (ref 11.5–15.5)
WBC: 6.9 10*3/uL (ref 4.0–10.5)
nRBC: 0 % (ref 0.0–0.2)

## 2021-11-04 LAB — BASIC METABOLIC PANEL
Anion gap: 11 (ref 5–15)
BUN: 74 mg/dL — ABNORMAL HIGH (ref 8–23)
CO2: 31 mmol/L (ref 22–32)
Calcium: 9.8 mg/dL (ref 8.9–10.3)
Chloride: 98 mmol/L (ref 98–111)
Creatinine, Ser: 3.07 mg/dL — ABNORMAL HIGH (ref 0.44–1.00)
GFR, Estimated: 15 mL/min — ABNORMAL LOW (ref >60)
Glucose, Bld: 112 mg/dL — ABNORMAL HIGH (ref 70–99)
Potassium: 4.2 mmol/L (ref 3.5–5.1)
Sodium: 140 mmol/L (ref 135–145)

## 2021-11-04 LAB — MAGNESIUM: Magnesium: 2 mg/dL (ref 1.7–2.4)

## 2021-11-04 MED ORDER — ORAL CARE MOUTH RINSE: 15.0000 mL | OROMUCOSAL | Status: DC | PRN

## 2021-11-04 NOTE — Plan of Care (Signed)
  Problem: Clinical Measurements: Goal: Respiratory complications will improve Outcome: Progressing Goal: Cardiovascular complication will be avoided Outcome: Progressing   Problem: Nutrition: Goal: Adequate nutrition will be maintained Outcome: Progressing   Problem: Elimination: Goal: Will not experience complications related to urinary retention Outcome: Progressing   Problem: Pain Managment: Goal: General experience of comfort will improve Outcome: Progressing   Problem: Activity: Goal: Risk for activity intolerance will decrease Outcome: Not Progressing   Problem: Elimination: Goal: Will not experience complications related to bowel motility Outcome: Not Progressing

## 2021-11-04 NOTE — Evaluation (Signed)
Physical Therapy Evaluation Patient Details Name: [name] MRN: [id number] DOB: Jun 04, 1945 Today's Date: 11/04/2021  History of Present Illness  Patient is a 76 y/o female who presents on 9/22 with SOB and LE edema/blisters. Admitted with acute on chronic heart failure and acute on chronic respiratory failure secondary to fluid overload. PMH includes CAD s/p CABG, AICD, A-fib, CHF, HTN< OSA, CVA, DM, obesity.  Clinical Impression  Patient presents with generalized weakness, pain, impaired balance, decreased activity tolerance and impaired mobility s/p above. Pt lives at home with her son in a mobile home and uses RW for ambulation PTA for household distances and w/c for community distances. She has an aide that comes daily to assist with ADLs and IADLs and has support from family otherwise. Supposed to be having HHPT services but she was not able to participate due to feeling nauseous. Today, pt requires min A for bed mobility, transfers and taking a few steps to get to chair with use of RW. Limited mainly by pain in bottom, LEs and back. Secretary ordering a geo mat for sitting in chair and pt needs a new bed. Will follow acutely to maximize independence and mobility prior to return home.     Recommendations for follow up therapy are one component of a multi-disciplinary discharge planning process, led by the attending physician.  Recommendations may be updated based on patient status, additional functional criteria and insurance authorization.  Follow Up Recommendations Home health PT (already set up)      Assistance Recommended at Discharge Intermittent Supervision/Assistance  Patient can return home with the following  A little help with bathing/dressing/bathroom;A little help with walking and/or transfers;Help with stairs or ramp for entrance;Assist for transportation;Assistance with cooking/housework    Equipment Recommendations None recommended by PT  Recommendations for Other  Services       Functional Status Assessment Patient has had a recent decline in their functional status and demonstrates the ability to make significant improvements in function in a reasonable and predictable amount of time.     Precautions / Restrictions Precautions Precautions: Fall;Other (comment) Precaution Comments: sacral wounds Restrictions Weight Bearing Restrictions: No      Mobility  Bed Mobility Overal bed mobility: Needs Assistance Bed Mobility: Supine to Sit     Supine to sit: Min assist, HOB elevated     General bed mobility comments: Assist with trunk to get to EOB, dizziness whichr esolved.    Transfers Overall transfer level: Needs assistance Equipment used: Rolling walker (2 wheels) Transfers: Sit to/from Stand, Bed to chair/wheelchair/BSC Sit to Stand: Min assist   Step pivot transfers: Min assist       General transfer comment: Min A to steady in standing from EOB x1, transferred to chair taking afew steps.    Ambulation/Gait Ambulation/Gait assistance: Min assist Gait Distance (Feet): 4 Feet Assistive device: Rolling walker (2 wheels) Gait Pattern/deviations: Trunk flexed, Step-to pattern Gait velocity: decreased     General Gait Details: Able to take a few steps with RW for support. Limited by pain in LEs and back.  Stairs            Wheelchair Mobility    Modified Rankin (Stroke Patients Only)       Balance Overall balance assessment: Needs assistance Sitting-balance support: Feet supported, Single extremity supported Sitting balance-Leahy Scale: Fair Sitting balance - Comments: Requires UE support and leans right to offload bottom Postural control: Right lateral lean Standing balance support: During functional activity, Reliant on assistive device for  balance Standing balance-Leahy Scale: Poor Standing balance comment: Requires UE support for balance and Min guard-Min A for dynamic tasks                              Pertinent Vitals/Pain Pain Assessment Pain Assessment: 0-10 Pain Score: 8  Pain Location: BLEs Pain Descriptors / Indicators: Sore Pain Intervention(s): Monitored during session, Repositioned, Limited activity within patient's tolerance    Home Living Family/patient expects to be discharged to:: Private residence Living Arrangements: Children (son) Available Help at Discharge: Family;Available PRN/intermittently;Personal care attendant Type of Home: Mobile home Home Access: Stairs to enter Entrance Stairs-Rails: Left Entrance Stairs-Number of Steps: 4   Home Layout: One level Home Equipment: Tub bench;Rolling Walker (2 wheels);Hospital bed;Wheelchair - manual;Adaptive equipment;Rollator (4 wheels);BSC/3in1 Additional Comments: Wears 2L O2 baseline.Recently increased to 3L in hospital.  Son works during day, not available to assist. her grandson is available.    Prior Function Prior Level of Function : Needs assist;History of Falls (last six months)       Physical Assist : ADLs (physical)   ADLs (physical): Bathing;Dressing;Toileting;IADLs Mobility Comments: Has been using her RW in home. Reports enjoying watching TV and cooking/housework. has wc for [place] distances. ADLs Comments: aide assists with IADLs, bathing and dressing  7days week; ind with bathroom transfers/toileting with RW; managing her own medications     Hand Dominance   Dominant Hand: Right    Extremity/Trunk Assessment   Upper Extremity Assessment Upper Extremity Assessment: Defer to OT evaluation    Lower Extremity Assessment Lower Extremity Assessment: Generalized weakness;RLE deficits/detail RLE Deficits / Details: blister on RLe.       Communication   Communication: No difficulties  Cognition Arousal/Alertness: Awake/alert Behavior During Therapy: WFL for tasks assessed/performed Overall Cognitive Status: Within Functional Limits for tasks assessed                                           General Comments General comments (skin integrity, edema, etc.): VSS on 3L/min 02 [place].    Exercises     Assessment/Plan    PT Assessment Patient needs continued PT services  PT Problem List Decreased strength;Decreased mobility;Obesity;Decreased skin integrity;Pain;Decreased balance;Decreased activity tolerance       PT Treatment Interventions Therapeutic activities;Gait training;Therapeutic exercise;Patient/family education;Balance training;Stair training;Functional mobility training    PT Goals (Current goals can be found in the Care Plan section)  Acute Rehab PT Goals Patient Stated Goal: improve pain PT Goal Formulation: With patient Time For Goal Achievement: 11/18/21 Potential to Achieve Goals: Good    Frequency Min 3X/week     Co-evaluation               AM-PAC PT "6 Clicks" Mobility  Outcome Measure Help needed turning from your back to your side while in a flat bed without using bedrails?: A Little Help needed moving from lying on your back to sitting on the side of a flat bed without using bedrails?: A Little Help needed moving to and from a bed to a chair (including a wheelchair)?: A Little Help needed standing up from a chair using your arms (e.g., wheelchair or bedside chair)?: A Little Help needed to walk in hospital room?: Total Help needed climbing 3-5 steps with a railing? : A Lot 6 Click Score: 15    End of Session  Equipment Utilized During Treatment: Gait belt;Oxygen Activity Tolerance: Patient limited by pain;Patient tolerated treatment well Patient left: in chair;with call bell/phone within reach;with nursing/sitter in room;with chair alarm set Nurse Communication: Mobility status PT Visit Diagnosis: Pain;Muscle weakness (generalized) (M62.81);Difficulty in walking, not elsewhere classified (R26.2) Pain - Right/Left:  (bil) Pain - part of body: Leg (back)    Time: 3664-4034 PT Time Calculation (min) (ACUTE ONLY):  24 min   Charges:   PT Evaluation $PT Eval Moderate Complexity: 1 Mod PT Treatments $Therapeutic Activity: 8-22 mins        Marisa Severin, PT, DPT Acute Rehabilitation Services Secure chat preferred Office [phone]     Marguarite Arbour A Why 11/04/2021, 8:21 AM

## 2021-11-04 NOTE — Evaluation (Signed)
Occupational Therapy Evaluation Patient Details Name: [name] MRN: [id number] DOB: 1945/11/08 Today's Date: 11/04/2021   History of Present Illness Patient is a 76 y/o female who presents on 9/22 with SOB and LE edema/blisters. Admitted with acute on chronic heart failure and acute on chronic respiratory failure secondary to fluid overload. PMH includes CAD s/p CABG, AICD, A-fib, CHF, HTN< OSA, CVA, DM, obesity.   Clinical Impression   Pt reports needing assist at baseline, has a PCA that comes daily x1 hour to assist with IADLs and bathing/dressing, along with a HHRN that comes 1x/month to refill pt's medications. Pt lives with son who works during the day and is unable to assist. Pt needing min guard-max A for ADLs, mod A for bed mobility, and min A for step pivot transfer to chair and BSC. Pt presenting with impairments listed below, will follow acutely. Recommend HHOT at d/c.     Recommendations for follow up therapy are one component of a multi-disciplinary discharge planning process, led by the attending physician.  Recommendations may be updated based on patient status, additional functional criteria and insurance authorization.   Follow Up Recommendations  Home health OT    Assistance Recommended at Discharge Intermittent Supervision/Assistance  Patient can return home with the following A little help with walking and/or transfers;A lot of help with bathing/dressing/bathroom;Assistance with cooking/housework;Direct supervision/assist for financial management;Direct supervision/assist for medications management;Assist for transportation;Help with stairs or ramp for entrance    Functional Status Assessment  Patient has had a recent decline in their functional status and demonstrates the ability to make significant improvements in function in a reasonable and predictable amount of time.  Equipment Recommendations  None recommended by OT (pt has all needed DME)     Recommendations for Other Services PT consult     Precautions / Restrictions Precautions Precautions: Fall;Other (comment) Precaution Comments: sacral wounds Restrictions Weight Bearing Restrictions: No      Mobility Bed Mobility Overal bed mobility: Needs Assistance Bed Mobility: Sit to Supine       Sit to supine: Mod assist   General bed mobility comments: assist for returning BLE's into bed    Transfers Overall transfer level: Needs assistance Equipment used: Rolling walker (2 wheels) Transfers: Sit to/from Stand, Bed to chair/wheelchair/BSC Sit to Stand: Min assist     Step pivot transfers: Min assist     General transfer comment: step pivot x2 to [facility] and bed      Balance Overall balance assessment: Needs assistance Sitting-balance support: Feet supported, Single extremity supported Sitting balance-Leahy Scale: Fair Sitting balance - Comments: Requires UE support and leans right to offload bottom   Standing balance support: During functional activity, Reliant on assistive device for balance Standing balance-Leahy Scale: Poor Standing balance comment: Requires UE support for balance and Min guard-Min A for dynamic tasks                           ADL either performed or assessed with clinical judgement   ADL Overall ADL's : Needs assistance/impaired Eating/Feeding: Modified independent   Grooming: Min guard   Upper Body Bathing: Minimal assistance   Lower Body Bathing: Maximal assistance   Upper Body Dressing : Minimal assistance   Lower Body Dressing: Maximal assistance   Toilet Transfer: Minimal assistance;Stand-pivot;BSC/3in1;Rolling walker (2 wheels)   Toileting- Clothing Manipulation and Hygiene: Maximal assistance Toileting - Clothing Manipulation Details (indicate cue type and reason): for pericare     Functional mobility  during ADLs: Minimal assistance;Rolling walker (2 wheels)       Vision   Vision Assessment?: No  apparent visual deficits Additional Comments: reports only wearing glasses for reading     Perception     Praxis      Pertinent Vitals/Pain Pain Assessment Pain Assessment: Faces Pain Score: 6  Faces Pain Scale: Hurts even more Pain Location: back Pain Descriptors / Indicators: Sore Pain Intervention(s): Limited activity within patient's tolerance, Monitored during session, Repositioned     Hand Dominance Right   Extremity/Trunk Assessment Upper Extremity Assessment Upper Extremity Assessment: Generalized weakness   Lower Extremity Assessment Lower Extremity Assessment: Defer to PT evaluation   Cervical / Trunk Assessment Cervical / Trunk Assessment: Normal (reports breaking her back/having back surgery a few months ago, no brace)   Communication Communication Communication: No difficulties   Cognition Arousal/Alertness: Awake/alert Behavior During Therapy: WFL for tasks assessed/performed Overall Cognitive Status: Within Functional Limits for tasks assessed                                       General Comments  VSS on 3L O2    Exercises     Shoulder Instructions      Home Living Family/patient expects to be discharged to:: Private residence Living Arrangements: Children (son) Available Help at Discharge: Family;Available PRN/intermittently;Personal care attendant Type of Home: Mobile home Home Access: Stairs to enter Entrance Stairs-Number of Steps: 4 Entrance Stairs-Rails: Left Home Layout: One level     Bathroom Shower/Tub: Teacher, early years/pre: Handicapped height Bathroom Accessibility: Yes   Home Equipment: Tub bench;Rolling Walker (2 wheels);Hospital bed;Wheelchair - manual;Adaptive equipment;Rollator (4 wheels);BSC/3in1 Adaptive Equipment: Reacher;Long-handled Conservation officer, historic buildings Additional Comments: Wears 2L O2 baseline.Recently increased to 3L in hospital.  Son works during day, not available to assist. her grandson is  available.      Prior Functioning/Environment Prior Level of Function : Needs assist;History of Falls (last six months)             Mobility Comments: Has been using her RW in home. Reports enjoying watching TV and cooking/housework. has wc for [place] distances. ADLs Comments: 5 days/week x1 hr per day aide assists with bathing, laundry, cleaning, household tasks, Has [facility] that assists with meds every 2 weeks        OT Problem List: Decreased strength;Decreased range of motion;Decreased activity tolerance;Decreased safety awareness;Cardiopulmonary status limiting activity      OT Treatment/Interventions: Self-care/ADL training;Therapeutic exercise;DME and/or AE instruction;Therapeutic activities;Patient/family education;Balance training    OT Goals(Current goals can be found in the care plan section) Acute Rehab OT Goals Patient Stated Goal: none stated OT Goal Formulation: With patient Time For Goal Achievement: 11/18/21 Potential to Achieve Goals: Good ADL Goals Pt Will Perform Grooming: with modified independence;standing Pt Will Perform Upper Body Dressing: with min guard assist;sitting Pt Will Perform Lower Body Dressing: with mod assist;sit to/from stand;sitting/lateral leans Pt Will Transfer to Toilet: with supervision;ambulating;regular height toilet Additional ADL Goal #1: pt will perform bed mobility with supervision in prep for ADLs  OT Frequency: Min 2X/week    Co-evaluation              AM-PAC OT "6 Clicks" Daily Activity     Outcome Measure Help from another person eating meals?: None Help from another person taking care of personal grooming?: A Little Help from another person toileting, which includes using toliet, bedpan,  or urinal?: A Little Help from another person bathing (including washing, rinsing, drying)?: A Lot Help from another person to put on and taking off regular upper body clothing?: A Little Help from another person to put on and  taking off regular lower body clothing?: A Lot 6 Click Score: 17   End of Session Equipment Utilized During Treatment: Rolling walker (2 wheels);Oxygen Nurse Communication: Mobility status  Activity Tolerance: Patient tolerated treatment well Patient left: in bed;with call bell/phone within reach  OT Visit Diagnosis: Unsteadiness on feet (R26.81);Other abnormalities of gait and mobility (R26.89);Muscle weakness (generalized) (M62.81)                Time: 9150-5697 OT Time Calculation (min): 28 min Charges:  OT General Charges $OT Visit: 1 Visit OT Evaluation $OT Eval Moderate Complexity: 1 Mod OT Treatments $Self Care/Home Management : 8-22 mins  Lynnda Child, OTD, OTR/L Acute Rehab [phone]) [phone] - [place] 11/04/2021, 3:31 PM

## 2021-11-04 NOTE — Care Management Important Message (Signed)
Important Message  Patient Details  Name: [name] MRN: [id number] Date of Birth: 23-Feb-1945   Medicare Important Message Given:  Yes     Orbie Pyo 11/04/2021, 2:26 PM

## 2021-11-04 NOTE — Plan of Care (Signed)
  Problem: Clinical Measurements: Goal: Will remain free from infection Outcome: Progressing Goal: Diagnostic test results will improve Outcome: Progressing Goal: Respiratory complications will improve Outcome: Progressing   Problem: Activity: Goal: Risk for activity intolerance will decrease Outcome: Progressing   Problem: Nutrition: Goal: Adequate nutrition will be maintained Outcome: Progressing   Problem: Coping: Goal: Level of anxiety will decrease Outcome: Progressing

## 2021-11-04 NOTE — Progress Notes (Signed)
Mobility Specialist Progress Note    11/04/21 1543  Mobility  Activity Ambulated with assistance in hallway  Level of Assistance Minimal assist, patient does 75% or more  Assistive Device Front wheel walker  Distance Ambulated (ft) 40 ft  Activity Response Tolerated well  $Mobility charge 1 Mobility   Pre-Mobility: 85 HR, 100% SpO2 Post-Mobility: 81 HR, 100% SpO2  Pt received in bed and agreeable. C/o 8/10 back pain and SOB with exertion. Returned to bed with call bell in reach.    Hildred Alamin Mobility Specialist

## 2021-11-04 NOTE — Progress Notes (Signed)
PROGRESS NOTE    [name]  UKG:[id number] DOB: [birth year] DOA: 11/01/2021 PCP: Lujean Amel, MD    Brief Narrative:  [name] is a 76 y.o. female with medical history significant for chronic HFpEF, CAD status post CABG in 2016, PPM, chronic hypoxic respiratory failure on 2.5-3 L , CKD stage IV, OSA, asthma, chronic anemia, HTN, IIDM, obesity, and hospital with worsening lower extremity edema and shortness of breath for 3 weeks.  Of note, she had recently seen her cardiologist and Lasix was increased to '80mg'$  twice daily with metolazone but for the last 4 days she was increasingly symptomatic with increased swelling and blisters.  In the ED, chest x-ray was clear.  Creatinine was 3.9 compared to previous 3.02.  Patient was given one-time dose of IV 60 mg of Lasix and was admitted hospital for further evaluation and treatment.     Assessment and Plan: Principal Problem:   CHF (congestive heart failure) (HCC) Active Problems:   Obesity   HTN (hypertension), benign   Acute on chronic diastolic heart failure (HCC)   Acute combined systolic and diastolic heart failure (HCC)   Acute on chronic respiratory failure with hypoxia (HCC)   Pressure injury of back, stage 2 ([place])   Acute on chronic HFpEF decompensation Presented with significant volume overload.  Continue IV Lasix 60 mg twice daily at this time.  Had 2D echocardiogram 3 months back.  Continue intake and output charting, daily weights.  Patient is negative balance with 3275 ml with  urine output of 2825 ml in 24 hours.  Continue to monitor BMP.  Fattening trending down at 3.0 from 3.3<3.9 could potentially change to oral by tomorrow.  Acute on chronic hypoxic respiratory failure Continue IV diuresis, currently on 3 L of oxygen.  Patient uses home oxygen at baseline.   Elevated troponins/CAD s/p CABG Continue aspirin Plavix and statin. No chest pain.  Likely demand ischemia from congestive heart failure and CKD.    Chronic sacral pressure ulcer, stage II, POA Pressure Injury 11/01/21 Sacrum Medial Stage 2 -  Partial thickness loss of dermis presenting as a shallow open injury with a red, pink wound bed without slough. fissure and associated area of superficial skin breakdown over sacrum (Active)  11/01/21 1434  Location: Sacrum  Location Orientation: Medial  Staging: Stage 2 -  Partial thickness loss of dermis presenting as a shallow open injury with a red, pink wound bed without slough.  Wound Description (Comments): fissure and associated area of superficial skin breakdown over sacrum  Present on Admission: Yes    Continue wound care    Hypokalemia Improved after replacement.  Latest potassium of 4.2   CKD stage IV Continue IV Lasix twice daily for now.  Strict intake and output charting Daily weights.  Improving creatinine levels.  Essential hypertension. Patient takes Lasix hydralazine Imdur at home.     Diabetes mellitus type 2. Continue diet control, hemoglobin A1c at 5.6.   Obesity -Body mass index is 38.26 kg/m.  Would benefit from weight loss as outpatient.  Leg pain and swelling.  ABI pending.    DVT prophylaxis: heparin injection 5,000 Units Start: 11/01/21 2200   Code Status:     Code Status: Full Code  Disposition: Home with home health in likely on 11/05/2021.  Seen by physical therapy who recommend home health PT on discharge  Status is: Inpatient  Remains inpatient appropriate because: IV diuresis, decompensated heart failure   Family Communication:  Spoke with the patient's  son Mr. [name] on the phone and updated him about the clinical condition of the patient and possible disposition on 11/05/2021  Consultants:  None  Procedures:  None  Antimicrobials:  None  Anti-infectives (From admission, onward)    None      Subjective: Today, patient was seen and examined at bedside.  Feels better today with breathing.  Still got back and leg pain.  No fever  chills or rigor.  Urinating quite some.  States that her leg swelling has gone down.  Objective: Vitals:   11/03/21 2019 11/03/21 2354 11/04/21 0404 11/04/21 0817  BP:  130/61 (!) 127/53 123/66  Pulse:  85 79 88  Resp:  17 18   Temp:  97.9 F (36.6 C) 98.1 F (36.7 C) 98.1 F (36.7 C)  TempSrc:  Oral Oral Oral  SpO2: 100% 100% 100% 96%  Weight:      Height:        Intake/Output Summary (Last 24 hours) at 11/04/2021 1045 Last data filed at 11/04/2021 0817 Gross per 24 hour  Intake 960 ml  Output 2525 ml  Net -1565 ml    Filed Weights   11/01/21 1150 11/02/21 1139 11/03/21 0316  Weight: 88.9 kg 95.7 kg 98 kg    Physical Examination: Body mass index is 38.26 kg/m.   General: Obese built, not in obvious distress on nasal cannula oxygen 3 L/min HENT:   No scleral pallor or icterus noted. Oral mucosa is moist.  Chest:    Diminished breath sounds bilaterally.  CVS: S1 &S2 heard. No murmur.  Regular rate and rhythm. Abdomen: Soft, nontender, nondistended.  Bowel sounds are heard.   Extremities: No cyanosis, clubbing with bilateral lower extremity edema.  Erythema of the lower extremities with blister on the right leg covered with dressing.   Psych: Alert, awake and oriented, normal mood CNS:  No cranial nerve deficits.  Power equal in all extremities.   Skin: Warm and dry.  Right leg blister covered with dressing.  Data Reviewed:   CBC: Recent Labs  Lab 11/01/21 1214 11/04/21 0338  WBC 6.7 6.9  HGB 8.6* 8.6*  HCT 28.9* 28.9*  MCV 98.6 96.0  PLT 132* 133*     Basic Metabolic Panel: Recent Labs  Lab 11/01/21 1214 11/02/21 0624 11/03/21 1119 11/04/21 0338  NA 139 141 138 140  K 3.5 4.0 4.1 4.2  CL 97* 100 96* 98  CO2 30 30 32 31  GLUCOSE 125* 97 154* 112*  BUN 86* 79* 75* 74*  CREATININE 3.96* 3.50* 3.35* 3.07*  CALCIUM 9.6 9.7 9.6 9.8  MG  --   --   --  2.0     Liver Function Tests: Recent Labs  Lab 11/01/21 1214  AST 20  ALT 22  ALKPHOS 79   BILITOT 0.4  PROT 5.8*  ALBUMIN 3.0*      Radiology Studies: No results found.    LOS: 3 days    Flora Lipps, MD [facility] Available via Epic secure chat 7am-7pm After these hours, please refer to coverage provider listed on [email or web address] 11/04/2021, 10:45 AM

## 2021-11-05 ENCOUNTER — Inpatient Hospital Stay (HOSPITAL_COMMUNITY): Payer: Medicare Other

## 2021-11-05 DIAGNOSIS — J9621 Acute and chronic respiratory failure with hypoxia: Secondary | ICD-10-CM | POA: Diagnosis not present

## 2021-11-05 DIAGNOSIS — I70229 Atherosclerosis of native arteries of extremities with rest pain, unspecified extremity: Secondary | ICD-10-CM

## 2021-11-05 DIAGNOSIS — I509 Heart failure, unspecified: Secondary | ICD-10-CM | POA: Diagnosis not present

## 2021-11-05 DIAGNOSIS — I5033 Acute on chronic diastolic (congestive) heart failure: Secondary | ICD-10-CM | POA: Diagnosis not present

## 2021-11-05 DIAGNOSIS — I5041 Acute combined systolic (congestive) and diastolic (congestive) heart failure: Secondary | ICD-10-CM | POA: Diagnosis not present

## 2021-11-05 DIAGNOSIS — L89102 Pressure ulcer of unspecified part of back, stage 2: Secondary | ICD-10-CM

## 2021-11-05 LAB — BASIC METABOLIC PANEL
Anion gap: 7 (ref 5–15)
BUN: 76 mg/dL — ABNORMAL HIGH (ref 8–23)
CO2: 31 mmol/L (ref 22–32)
Calcium: 9.2 mg/dL (ref 8.9–10.3)
Chloride: 98 mmol/L (ref 98–111)
Creatinine, Ser: 3.16 mg/dL — ABNORMAL HIGH (ref 0.44–1.00)
GFR, Estimated: 15 mL/min — ABNORMAL LOW (ref >60)
Glucose, Bld: 147 mg/dL — ABNORMAL HIGH (ref 70–99)
Potassium: 4.2 mmol/L (ref 3.5–5.1)
Sodium: 136 mmol/L (ref 135–145)

## 2021-11-05 LAB — MAGNESIUM: Magnesium: 1.9 mg/dL (ref 1.7–2.4)

## 2021-11-05 NOTE — Consult Note (Signed)
ABI/TBIToday's ABIToday's TBIPrevious ABIPrevious TBI  +-------+-----------+-----------+------------+------------+  Right  1.07       0.64                                 +-------+-----------+-----------+------------+------------+  Left   1.03       0.61                                 +-------+-----------+-----------+------------+------------+  Will follow up with MD on application of Unna's boots bilaterally   [place] MSN, RN,CWOCN, CNS, CWON-AP [phone])

## 2021-11-05 NOTE — TOC Progression Note (Signed)
Transition of Care [facility]) - Progression Note    Patient Details  Name: [name] MRN: [id number] Date of Birth: 06/27/1945  Transition of Care [facility]) CM/SW [place], RN Phone Number:[phone]  11/05/2021, 12:54 PM  Clinical Narrative:    Patient is active with [facility] and will resume home health services at discharge. CM has confirmed with Claiborne Billings at [facility].         Expected Discharge Plan and Services                                                 Social Determinants of Health (SDOH) Interventions Food Insecurity Interventions: Intervention Not Indicated Housing Interventions: Intervention Not Indicated Transportation Interventions: Intervention Not Indicated Utilities Interventions: Intervention Not Indicated  Readmission Risk Interventions    01/10/2021    9:47 AM  Readmission Risk Prevention Plan  Transportation Screening Complete  PCP or Specialist Appt within 3-5 Days Complete  HRI or [place] Complete  Social Work Consult for [place] Planning/Counseling Complete  Palliative Care Screening Not Applicable  Medication Review Press photographer) Complete

## 2021-11-05 NOTE — TOC Transition Note (Signed)
Transition of Care [facility]) - CM/SW Discharge Note   Patient Details  Name: SONIYAH MCGLORY MRN: [id number] Date of Birth: 02/10/1946  Transition of Care [facility]) CM/SW Contact:  Angelita Ingles, RN Phone [phone]  11/05/2021, 2:08 PM   Clinical Narrative:    Patient discharging with home health orders. Home health to resume with [facility]. CM has verified with Claiborne Billings at [facility]. AVS updated         Patient Goals and CMS Choice        Discharge Placement                       Discharge Plan and Services                                     Social Determinants of Health (SDOH) Interventions Food Insecurity Interventions: Intervention Not Indicated Housing Interventions: Intervention Not Indicated Transportation Interventions: Intervention Not Indicated Utilities Interventions: Intervention Not Indicated   Readmission Risk Interventions    01/10/2021    9:47 AM  Readmission Risk Prevention Plan  Transportation Screening Complete  PCP or Specialist Appt within 3-5 Days Complete  HRI or [place] Complete  Social Work Consult for [place] Planning/Counseling Complete  Palliative Care Screening Not Applicable  Medication Review Press photographer) Complete

## 2021-11-05 NOTE — Discharge Summary (Signed)
Physician Discharge Summary  [name] SWF:[id number] DOB: [birth year] DOA: 11/01/2021  PCP: Lujean Amel, MD  Admit date: 11/01/2021 Discharge date: 11/05/2021  Admitted From: Home  Discharge disposition: Home with home health  Recommendations for Outpatient Follow-Up:   Follow up with your primary care provider in one week.  Check CBC, BMP, magnesium in the next visit Follow-up with cardiology as scheduled by the clinic.  Discharge Diagnosis:   Principal Problem:   CHF (congestive heart failure) (HCC) Active Problems:   Obesity   HTN (hypertension), benign   Acute on chronic diastolic heart failure (HCC)   Acute combined systolic and diastolic heart failure (HCC)   Acute on chronic respiratory failure with hypoxia (HCC)   Pressure injury of back, stage 2 ([place])   Discharge Condition: Improved.  Diet recommendation: Low sodium, heart healthy.  Fluid restriction 1500 MLS per day  Wound care: None.  Code status: Full.   History of Present Illness:   [name] is a 76 y.o. female with medical history significant for chronic HFpEF, CAD status post CABG in 2016, PPM, chronic hypoxic respiratory failure on 2.5-3 L , CKD stage IV, OSA, asthma, chronic anemia, HTN, IIDM, obesity, and hospital with worsening lower extremity edema and shortness of breath for 3 weeks.  Of note, she had recently seen her cardiologist and Lasix was increased to 33m twice daily with metolazone but for the last 4 days she was increasingly symptomatic with increased swelling and blisters.  In the ED, chest x-ray was clear.  Creatinine was 3.9 compared to previous 3.02.  Patient was given one-time dose of IV 60 mg of Lasix and was admitted hospital for further evaluation and treatment.   Hospital Course:   Following conditions were addressed during hospitalization as listed below,  Acute on chronic HFpEF decompensation Presented with significant volume overload.  Continue IV Lasix 60  mg twice daily at this time.  Had 2D echocardiogram 3 months back.  Continue intake and output charting, daily weights.  Patient is negative balance with 3275 ml with  urine output of 2825 ml in 24 hours.  Continue to monitor BMP.  Fattening trending down at 3.0 from 3.3<3.9 could potentially change to oral by tomorrow.   Acute on chronic hypoxic respiratory failure Continue IV diuresis, currently on 3 L of oxygen.  Patient uses home oxygen at baseline.   Elevated troponins/CAD s/p CABG Continue aspirin Plavix and statin. No chest pain.  Likely demand ischemia from congestive heart failure and CKD  Chronic sacral pressure ulcer, stage II, POA 11/01/21 1434  Location: Sacrum  Location Orientation: Medial  Staging: Stage 2 -  Partial thickness loss of dermis presenting as a shallow open injury with a red, pink wound bed without slough.  Wound Description (Comments): fissure and associated area of superficial skin breakdown over sacrum  Present on Admission: Yes    Hypokalemia Improved after replacement.  Latest potassium of 4.2   CKD stage IV Continue IV Lasix twice daily for now.  Strict intake and output charting Daily weights.  Improving creatinine levels.   Essential hypertension. Patient takes Lasix hydralazine Imdur at home.     Diabetes mellitus type 2. Continue diet control, hemoglobin A1c at 5.6.   Obesity -Body mass index is 38.26 kg/m.  Would benefit from weight loss as outpatient.   Leg pain and swelling.  Bilateral lower extremity venous insufficiency.  Would benefit from UThe Krogerapplication prior to discharge.  Does have a blister which has  ruptured at this time.  ABI was negative for significant peripheral vascular disease.  Disposition.  At this time, patient is stable for disposition with home health.  Patient will follow-up with PCP and cardiology as outpatient.  Medical Consultants:   Wound care  Procedures:    Unna boot placement Subjective:   Today,  patient seen and examined at bedside.  Feels better with breathing.  Complains of mild pain over the leg especially with blister it.  Discharge Exam:   Vitals:   11/05/21 0842 11/05/21 1147  BP:  108/61  Pulse: 85 80  Resp: 19 18  Temp:  97.9 F (36.6 C)  SpO2: 100% 100%   Vitals:   11/05/21 0445 11/05/21 0747 11/05/21 0842 11/05/21 1147  BP:  (!) 127/57  108/61  Pulse:   85 80  Resp:   19 18  Temp:  97.8 F (36.6 C)  97.9 F (36.6 C)  TempSrc:  Oral  Oral  SpO2:   100% 100%  Weight: 98 kg     Height:       Body mass index is 38.26 kg/m.   General: Alert awake, not in obvious distress, obese, on nasal cannula oxygen HENT: pupils equally reacting to light,  No scleral pallor or icterus noted. Oral mucosa is moist.  Chest: .  Diminished breath sounds bilaterally.  CVS: S1 &S2 heard. No murmur.  Regular rate and rhythm. Abdomen: Soft, nontender, nondistended.  Bowel sounds are heard.   Extremities: No cyanosis, clubbing with bilateral lower extremity edema wrapped.  Peripheral pulses are palpable. Psych: Alert, awake and oriented, normal mood CNS:  No cranial nerve deficits.  Power equal in all extremities.   Skin: Warm and dry.  Lower extremity edema wrapped  The results of significant diagnostics from this hospitalization (including imaging, microbiology, ancillary and laboratory) are listed below for reference.     Diagnostic Studies:   DG Chest Port 1 View  Result Date: 11/01/2021 CLINICAL DATA:  Shortness of breath EXAM: PORTABLE CHEST 1 VIEW COMPARISON:  09/09/2021 FINDINGS: Right-sided implanted cardiac device. Prior sternotomy and CABG. Stable cardiomegaly. Aortic atherosclerosis. No focal airspace consolidation, pleural effusion, or pneumothorax. New midthoracic fusion hardware. IMPRESSION: No acute cardiopulmonary findings. Stable cardiomegaly. Electronically Signed   By: Davina Poke D.O.   On: 11/01/2021 12:43     Labs:   Basic Metabolic Panel: Recent  Labs  Lab 11/01/21 1214 11/02/21 0624 11/03/21 1119 11/04/21 0338 11/05/21 0021  NA 139 141 138 140 136  K 3.5 4.0 4.1 4.2 4.2  CL 97* 100 96* 98 98  CO2 30 30 32 31 31  GLUCOSE 125* 97 154* 112* 147*  BUN 86* 79* 75* 74* 76*  CREATININE 3.96* 3.50* 3.35* 3.07* 3.16*  CALCIUM 9.6 9.7 9.6 9.8 9.2  MG  --   --   --  2.0 1.9   GFR Estimated Creatinine Clearance: 16.6 mL/min (A) (by C-G formula based on SCr of 3.16 mg/dL (H)). Liver Function Tests: Recent Labs  Lab 11/01/21 1214  AST 20  ALT 22  ALKPHOS 79  BILITOT 0.4  PROT 5.8*  ALBUMIN 3.0*   No results for input(s): "LIPASE", "AMYLASE" in the last 168 hours. No results for input(s): "AMMONIA" in the last 168 hours. Coagulation profile No results for input(s): "INR", "PROTIME" in the last 168 hours.  CBC: Recent Labs  Lab 11/01/21 1214 11/04/21 0338  WBC 6.7 6.9  HGB 8.6* 8.6*  HCT 28.9* 28.9*  MCV 98.6 96.0  PLT  132* 133*   Cardiac Enzymes: No results for input(s): "CKTOTAL", "CKMB", "CKMBINDEX", "TROPONINI" in the last 168 hours. BNP: Invalid input(s): "POCBNP" CBG: No results for input(s): "GLUCAP" in the last 168 hours. D-Dimer No results for input(s): "DDIMER" in the last 72 hours. Hgb A1c No results for input(s): "HGBA1C" in the last 72 hours. Lipid Profile No results for input(s): "CHOL", "HDL", "LDLCALC", "TRIG", "CHOLHDL", "LDLDIRECT" in the last 72 hours. Thyroid function studies No results for input(s): "TSH", "T4TOTAL", "T3FREE", "THYROIDAB" in the last 72 hours.  Invalid input(s): "FREET3" Anemia work up No results for input(s): "VITAMINB12", "FOLATE", "FERRITIN", "TIBC", "IRON", "RETICCTPCT" in the last 72 hours. Microbiology Recent Results (from the past 240 hour(s))  SARS Coronavirus 2 by RT PCR (hospital order, performed in [facility] hospital lab) *cepheid single result test* Anterior Nasal Swab     Status: None   Collection Time: 11/01/21 12:14 PM   Specimen: Anterior Nasal Swab   Result Value Ref Range Status   SARS Coronavirus 2 by RT PCR NEGATIVE NEGATIVE Final    Comment: (NOTE) SARS-CoV-2 target nucleic acids are NOT DETECTED.  The SARS-CoV-2 RNA is generally detectable in upper and lower respiratory specimens during the acute phase of infection. The lowest concentration of SARS-CoV-2 viral copies this assay can detect is 250 copies / mL. A negative result does not preclude SARS-CoV-2 infection and should not be used as the sole basis for treatment or other patient management decisions.  A negative result may occur with improper specimen collection / handling, submission of specimen other than nasopharyngeal swab, presence of viral mutation(s) within the areas targeted by this assay, and inadequate number of viral copies (<250 copies / mL). A negative result must be combined with clinical observations, patient history, and epidemiological information.  Fact Sheet for Patients:   https://www.patel.info/  Fact Sheet for Healthcare Providers: https://hall.com/  This test is not yet approved or  cleared by the [place] FDA and has been authorized for detection and/or diagnosis of SARS-CoV-2 by FDA under an Emergency Use Authorization (EUA).  This EUA will remain in effect (meaning this test can be used) for the duration of the COVID-19 declaration under Section 564(b)(1) of the Act, 21 U.S.C. section 360bbb-3(b)(1), unless the authorization is terminated or revoked sooner.  Performed at [facility], [place] [address] [place], [place] [address]   MRSA Next Gen by PCR, Nasal     Status: None   Collection Time: 11/01/21  4:30 PM   Specimen: Nasal Mucosa; Nasal Swab  Result Value Ref Range Status   MRSA by PCR Next Gen NOT DETECTED NOT DETECTED Final    Comment: (NOTE) The GeneXpert MRSA Assay (FDA approved for NASAL specimens only), is one component of a comprehensive MRSA colonization  surveillance program. It is not intended to diagnose MRSA infection nor to guide or monitor treatment for MRSA infections. Test performance is not FDA approved in patients less than 49 years old. Performed at [facility], [place] [address], [place], [place] [address]      Discharge Instructions:   Discharge Instructions     (HEART FAILURE PATIENTS) Call MD:  Anytime you have any of the following symptoms: 1) 3 pound weight gain in 24 hours or 5 pounds in 1 week 2) shortness of breath, with or without a dry hacking cough 3) swelling in the hands, feet or stomach 4) if you have to sleep on extra pillows at night in order to breathe.   Complete  by: As directed    Avoid straining   Complete by: As directed    Diet - low sodium heart healthy   Complete by: As directed    Discharge instructions   Complete by: As directed    Follow-up with your primary care provider in 1 week.  Check blood work at that time.  Continue Unna boot at home.  Restriction 1500 MLS per day.  Low-salt diet.  Daily weights at home.   Heart Failure patients record your daily weight using the same scale at the same time of day   Complete by: As directed    Increase activity slowly   Complete by: As directed    STOP any activity that causes chest pain, shortness of breath, dizziness, sweating, or exessive weakness   Complete by: As directed       Allergies as of 11/05/2021       Reactions   Ivp Dye [iodinated Contrast Media] Shortness Of Breath   No reaction to PO contrast with non-ionic dye.06-25-2014/rsm   Shellfish Allergy Anaphylaxis   Sulfa Antibiotics Shortness Of Breath   Iodine Hives   Atorvastatin Other (See Comments)   Pt states "causes bilateral leg pain/cramps."   Benadryl [diphenhydramine] Other (See Comments)   "THIS DRIVES ME CRAZY AND MAKES ME FEEL LIKE I AM DYING"   Colchicine Nausea And Vomiting   Doxycycline Other (See Comments)   Unknown reaction, per patient   Uloric [febuxostat]  Other (See Comments)   Unknown reaction   Cephalexin Itching, Rash   Zithromax [azithromycin] Rash        Medication List     TAKE these medications    Accu-Chek Aviva Plus test strip Generic drug: glucose blood Check blood sugar twice daily   Accu-Chek Guide w/Device Kit   Accu-Chek Softclix Lancets lancets Check blood sugar twice daily   acetaminophen 500 MG tablet Commonly known as: TYLENOL Take 1,000 mg by mouth 3 (three) times daily as needed (for pain or headaches).   albuterol 108 (90 Base) MCG/ACT inhaler Commonly known as: VENTOLIN HFA Inhale 2 puffs into the lungs every 6 (six) hours as needed for wheezing or shortness of breath.   albuterol (2.5 MG/3ML) 0.083% nebulizer solution Commonly known as: PROVENTIL Take 3 mLs (2.5 mg total) by nebulization every 6 (six) hours as needed for wheezing or shortness of breath.   allopurinol 100 MG tablet Commonly known as: ZYLOPRIM Take 1 tablet (100 mg total) by mouth daily.   aspirin EC 81 MG tablet Take 1 tablet (81 mg total) by mouth daily.   B-D SINGLE USE SWABS REGULAR Pads   budesonide-formoterol 160-4.5 MCG/ACT inhaler Commonly known as: SYMBICORT Inhale 1 puff into the lungs 2 (two) times daily.   clopidogrel 75 MG tablet Commonly known as: PLAVIX Take 1 tablet (75 mg total) by mouth daily.   cyanocobalamin 1000 MCG tablet Commonly known as: VITAMIN B12 Take 1 tablet (1,000 mcg total) by mouth daily.   cycloSPORINE 0.05 % ophthalmic emulsion Commonly known as: RESTASIS Place 1 drop into both eyes 2 (two) times daily.   EPINEPHrine 0.3 mg/0.3 mL Soaj injection Commonly known as: EPI-PEN Inject 0.3 mg into the muscle once as needed for anaphylaxis (severe allergic reaction).   fluticasone 50 MCG/ACT nasal spray Commonly known as: FLONASE Place 1 spray into both nostrils daily.   furosemide 80 MG tablet Commonly known as: LASIX Take 1 tablet (80 mg total) by mouth 2 (two) times daily.    HYDROcodone-acetaminophen 5-325  MG tablet Commonly known as: NORCO/VICODIN Take 1 tablet by mouth every 6 (six) hours as needed.   hydrOXYzine 25 MG tablet Commonly known as: ATARAX Take 25 mg by mouth 2 (two) times daily as needed for itching.   isosorbide mononitrate 30 MG 24 hr tablet Commonly known as: IMDUR Take 3 tablets (90 mg total) by mouth daily.   Lidocaine 0.5 % Aero Place 1 spray rectally every 6 (six) hours as needed (hemorrhoids). Preparation H rapid relief lidocaine spray   Linzess 290 MCG Caps capsule Generic drug: linaclotide Take 290 mcg by mouth every morning.   metolazone 2.5 MG tablet Commonly known as: ZAROXOLYN Take 1 tablet (2.5 mg total) by mouth once a week. AS NEEDED   nitroGLYCERIN 0.4 MG SL tablet Commonly known as: NITROSTAT Place 1 tablet (0.4 mg total) under the tongue every 5 (five) minutes as needed for chest pain.   ondansetron 4 MG tablet Commonly known as: ZOFRAN Take 4 mg by mouth daily as needed for nausea or vomiting.   OVER THE COUNTER MEDICATION Apply 1 patch topically as needed (for pain).   OXYGEN Inhale 2-3 L/min into the lungs continuous.   oxymetazoline 0.05 % nasal spray Commonly known as: AFRIN Place 1 spray into both nostrils 2 (two) times daily as needed for congestion.   pantoprazole 40 MG tablet Commonly known as: PROTONIX Take 1 tablet (40 mg total) by mouth 2 (two) times daily.   polyethylene glycol 17 g packet Commonly known as: MIRALAX / GLYCOLAX Take 17 g by mouth daily as needed (constipation).   Potassium Chloride ER 20 MEQ Tbcr Take 60 mEq by mouth daily in the afternoon.   predniSONE 10 MG tablet Commonly known as: DELTASONE Take 10 mg by mouth daily with breakfast.   predniSONE 10 MG tablet Commonly known as: DELTASONE Take 1 tablet (10 mg total) by mouth daily with breakfast.   rosuvastatin 20 MG tablet Commonly known as: CRESTOR Take 1 tablet (20 mg total) by mouth every evening. NEED  OV.   simethicone 80 MG chewable tablet Commonly known as: MYLICON Chew 80 mg by mouth 4 (four) times daily as needed for flatulence.   sucralfate 1 g tablet Commonly known as: CARAFATE Take 1 tablet (1 g total) by mouth 4 (four) times daily -  with meals and at bedtime.   traMADol 50 MG tablet Commonly known as: ULTRAM Take 50 mg by mouth daily as needed (pain).        Follow-up Information     [name], [name], MD Follow up in 1 week(s).   Specialty: Family Medicine Contact information: [place] [address] [phone]         Sanda Klein, MD .   Specialty: Cardiology Contact information: [address] [place] [address] [phone]                  Time coordinating discharge: 39 minutes  Signed:  Yvetta Drotar  [facility] 11/05/2021, 1:51 PM

## 2021-11-05 NOTE — Progress Notes (Signed)
Discharge instructions given to patient. Student RN answered all questions. Patient verbalized understanding. IV removed. Home oxygen delivered by granddaughter. Discharged to private vehicle.

## 2021-11-05 NOTE — Progress Notes (Signed)
ABI has been completed.   Preliminary results in CV Proc.   [name] 11/05/2021 11:45 AM

## 2021-11-05 NOTE — Consult Note (Signed)
WOC follow up, seen last week 11/01/21 at which time requested ABIs to determine safety of compression if MD desired.  Followed up 9/25 no ABIs resulted/completed Followed up 11/05/21 ABIs appear to have been re-ordered. Not resulted.   Will continue to follow peripherally  [place], [place], [place]

## 2021-11-05 NOTE — Consult Note (Signed)
[place] Nurse wound follow up Wound type: blistering secondary to edema and redness Measurement: see nursing flow sheets Wound bed: serous oozing from blister  Drainage (amount, consistency, odor) serous Periwound: redness, however patient reported present for several months.  Blistering is a new acute finding during this admisison Dressing procedure/placement/frequency: ABIs resulted and normal. Discussed with hospitalist and CM; arranged HHRN for follow up for compression therapy and wound care beginning on 11/08/21. Contacted orthopedic tech to have bilateral Unna's boots applied. Notified bedside nurse to remove kerlix and replace with silicone foam over xeroform prior to Unna's boot application  Discharge instructions: Cleanse bilateral LEs with soap and water, pat dry  Cover open wounds with xeroform gauze and silicone foam   Apply Unna's boots bilaterally  Change 2x per week Tues/Friday, may decrease to weekly when swelling stabilized  Measure for long term compression stockings when swelling stabilized   Assist patient with ordering or taking measurement to DME company for stockings for long term compression therapy.   Discussed POC with patient and bedside nurse.  Re consult if needed, will not follow at this time. Thanks  Tishie Altmann [employer], RN,CWOCN, CNS, [place] [phone])

## 2021-11-05 NOTE — Progress Notes (Signed)
Orthopedic Tech Progress Note Patient Details:  [name] Apr 24, 1945 [id number]  Ortho Devices Type of Ortho Device: Louretta Parma boot Ortho Device/Splint Location: BILATERAL Ortho Device/Splint Interventions: Ordered, Application, Adjustment   Post Interventions Patient Tolerated: Well Instructions Provided: Care of device  [name] 11/05/2021, 2:16 PM

## 2021-11-06 DIAGNOSIS — J9611 Chronic respiratory failure with hypoxia: Secondary | ICD-10-CM | POA: Diagnosis not present

## 2021-11-06 DIAGNOSIS — I5022 Chronic systolic (congestive) heart failure: Secondary | ICD-10-CM | POA: Diagnosis not present

## 2021-11-06 DIAGNOSIS — M19072 Primary osteoarthritis, left ankle and foot: Secondary | ICD-10-CM | POA: Diagnosis not present

## 2021-11-06 DIAGNOSIS — N1832 Chronic kidney disease, stage 3b: Secondary | ICD-10-CM | POA: Diagnosis not present

## 2021-11-06 DIAGNOSIS — M6281 Muscle weakness (generalized): Secondary | ICD-10-CM | POA: Diagnosis not present

## 2021-11-06 DIAGNOSIS — J9621 Acute and chronic respiratory failure with hypoxia: Secondary | ICD-10-CM | POA: Diagnosis not present

## 2021-11-06 DIAGNOSIS — J9612 Chronic respiratory failure with hypercapnia: Secondary | ICD-10-CM | POA: Diagnosis not present

## 2021-11-08 DIAGNOSIS — I2510 Atherosclerotic heart disease of native coronary artery without angina pectoris: Secondary | ICD-10-CM | POA: Diagnosis not present

## 2021-11-08 DIAGNOSIS — K589 Irritable bowel syndrome without diarrhea: Secondary | ICD-10-CM | POA: Diagnosis not present

## 2021-11-08 DIAGNOSIS — I959 Hypotension, unspecified: Secondary | ICD-10-CM | POA: Diagnosis not present

## 2021-11-08 DIAGNOSIS — S22079D Unspecified fracture of T9-T10 vertebra, subsequent encounter for fracture with routine healing: Secondary | ICD-10-CM | POA: Diagnosis not present

## 2021-11-08 DIAGNOSIS — J45909 Unspecified asthma, uncomplicated: Secondary | ICD-10-CM | POA: Diagnosis not present

## 2021-11-08 DIAGNOSIS — D519 Vitamin B12 deficiency anemia, unspecified: Secondary | ICD-10-CM | POA: Diagnosis not present

## 2021-11-08 DIAGNOSIS — I442 Atrioventricular block, complete: Secondary | ICD-10-CM | POA: Diagnosis not present

## 2021-11-08 DIAGNOSIS — K219 Gastro-esophageal reflux disease without esophagitis: Secondary | ICD-10-CM | POA: Diagnosis not present

## 2021-11-08 DIAGNOSIS — I5042 Chronic combined systolic (congestive) and diastolic (congestive) heart failure: Secondary | ICD-10-CM | POA: Diagnosis not present

## 2021-11-08 DIAGNOSIS — L89322 Pressure ulcer of left buttock, stage 2: Secondary | ICD-10-CM | POA: Diagnosis not present

## 2021-11-08 DIAGNOSIS — G4733 Obstructive sleep apnea (adult) (pediatric): Secondary | ICD-10-CM | POA: Diagnosis not present

## 2021-11-08 DIAGNOSIS — G8929 Other chronic pain: Secondary | ICD-10-CM | POA: Diagnosis not present

## 2021-11-08 DIAGNOSIS — I428 Other cardiomyopathies: Secondary | ICD-10-CM | POA: Diagnosis not present

## 2021-11-08 DIAGNOSIS — D696 Thrombocytopenia, unspecified: Secondary | ICD-10-CM | POA: Diagnosis not present

## 2021-11-08 DIAGNOSIS — M1990 Unspecified osteoarthritis, unspecified site: Secondary | ICD-10-CM | POA: Diagnosis not present

## 2021-11-08 DIAGNOSIS — N186 End stage renal disease: Secondary | ICD-10-CM | POA: Diagnosis not present

## 2021-11-08 DIAGNOSIS — M109 Gout, unspecified: Secondary | ICD-10-CM | POA: Diagnosis not present

## 2021-11-08 DIAGNOSIS — E7800 Pure hypercholesterolemia, unspecified: Secondary | ICD-10-CM | POA: Diagnosis not present

## 2021-11-08 DIAGNOSIS — E1122 Type 2 diabetes mellitus with diabetic chronic kidney disease: Secondary | ICD-10-CM | POA: Diagnosis not present

## 2021-11-08 DIAGNOSIS — I480 Paroxysmal atrial fibrillation: Secondary | ICD-10-CM | POA: Diagnosis not present

## 2021-11-08 DIAGNOSIS — I132 Hypertensive heart and chronic kidney disease with heart failure and with stage 5 chronic kidney disease, or end stage renal disease: Secondary | ICD-10-CM | POA: Diagnosis not present

## 2021-11-08 DIAGNOSIS — J9611 Chronic respiratory failure with hypoxia: Secondary | ICD-10-CM | POA: Diagnosis not present

## 2021-11-11 DIAGNOSIS — M1990 Unspecified osteoarthritis, unspecified site: Secondary | ICD-10-CM | POA: Diagnosis not present

## 2021-11-11 DIAGNOSIS — N186 End stage renal disease: Secondary | ICD-10-CM | POA: Diagnosis not present

## 2021-11-11 DIAGNOSIS — I5042 Chronic combined systolic (congestive) and diastolic (congestive) heart failure: Secondary | ICD-10-CM | POA: Diagnosis not present

## 2021-11-11 DIAGNOSIS — G4733 Obstructive sleep apnea (adult) (pediatric): Secondary | ICD-10-CM | POA: Diagnosis not present

## 2021-11-11 DIAGNOSIS — K219 Gastro-esophageal reflux disease without esophagitis: Secondary | ICD-10-CM | POA: Diagnosis not present

## 2021-11-11 DIAGNOSIS — I480 Paroxysmal atrial fibrillation: Secondary | ICD-10-CM | POA: Diagnosis not present

## 2021-11-11 DIAGNOSIS — L89322 Pressure ulcer of left buttock, stage 2: Secondary | ICD-10-CM | POA: Diagnosis not present

## 2021-11-11 DIAGNOSIS — I428 Other cardiomyopathies: Secondary | ICD-10-CM | POA: Diagnosis not present

## 2021-11-11 DIAGNOSIS — M109 Gout, unspecified: Secondary | ICD-10-CM | POA: Diagnosis not present

## 2021-11-11 DIAGNOSIS — I959 Hypotension, unspecified: Secondary | ICD-10-CM | POA: Diagnosis not present

## 2021-11-11 DIAGNOSIS — K589 Irritable bowel syndrome without diarrhea: Secondary | ICD-10-CM | POA: Diagnosis not present

## 2021-11-11 DIAGNOSIS — I2510 Atherosclerotic heart disease of native coronary artery without angina pectoris: Secondary | ICD-10-CM | POA: Diagnosis not present

## 2021-11-11 DIAGNOSIS — S22079D Unspecified fracture of T9-T10 vertebra, subsequent encounter for fracture with routine healing: Secondary | ICD-10-CM | POA: Diagnosis not present

## 2021-11-11 DIAGNOSIS — D696 Thrombocytopenia, unspecified: Secondary | ICD-10-CM | POA: Diagnosis not present

## 2021-11-11 DIAGNOSIS — I442 Atrioventricular block, complete: Secondary | ICD-10-CM | POA: Diagnosis not present

## 2021-11-11 DIAGNOSIS — J9611 Chronic respiratory failure with hypoxia: Secondary | ICD-10-CM | POA: Diagnosis not present

## 2021-11-11 DIAGNOSIS — I132 Hypertensive heart and chronic kidney disease with heart failure and with stage 5 chronic kidney disease, or end stage renal disease: Secondary | ICD-10-CM | POA: Diagnosis not present

## 2021-11-11 DIAGNOSIS — E1122 Type 2 diabetes mellitus with diabetic chronic kidney disease: Secondary | ICD-10-CM | POA: Diagnosis not present

## 2021-11-11 DIAGNOSIS — D519 Vitamin B12 deficiency anemia, unspecified: Secondary | ICD-10-CM | POA: Diagnosis not present

## 2021-11-11 DIAGNOSIS — J45909 Unspecified asthma, uncomplicated: Secondary | ICD-10-CM | POA: Diagnosis not present

## 2021-11-11 DIAGNOSIS — E7800 Pure hypercholesterolemia, unspecified: Secondary | ICD-10-CM | POA: Diagnosis not present

## 2021-11-11 DIAGNOSIS — G8929 Other chronic pain: Secondary | ICD-10-CM | POA: Diagnosis not present

## 2021-11-12 DIAGNOSIS — I5042 Chronic combined systolic (congestive) and diastolic (congestive) heart failure: Secondary | ICD-10-CM | POA: Diagnosis not present

## 2021-11-12 DIAGNOSIS — E1122 Type 2 diabetes mellitus with diabetic chronic kidney disease: Secondary | ICD-10-CM | POA: Diagnosis not present

## 2021-11-12 DIAGNOSIS — N184 Chronic kidney disease, stage 4 (severe): Secondary | ICD-10-CM | POA: Diagnosis not present

## 2021-11-12 DIAGNOSIS — L98429 Non-pressure chronic ulcer of back with unspecified severity: Secondary | ICD-10-CM | POA: Diagnosis not present

## 2021-11-12 DIAGNOSIS — E7800 Pure hypercholesterolemia, unspecified: Secondary | ICD-10-CM | POA: Diagnosis not present

## 2021-11-12 DIAGNOSIS — M109 Gout, unspecified: Secondary | ICD-10-CM | POA: Diagnosis not present

## 2021-11-12 DIAGNOSIS — I429 Cardiomyopathy, unspecified: Secondary | ICD-10-CM | POA: Diagnosis not present

## 2021-11-12 DIAGNOSIS — D519 Vitamin B12 deficiency anemia, unspecified: Secondary | ICD-10-CM | POA: Diagnosis not present

## 2021-11-12 DIAGNOSIS — I132 Hypertensive heart and chronic kidney disease with heart failure and with stage 5 chronic kidney disease, or end stage renal disease: Secondary | ICD-10-CM | POA: Diagnosis not present

## 2021-11-12 DIAGNOSIS — J9611 Chronic respiratory failure with hypoxia: Secondary | ICD-10-CM | POA: Diagnosis not present

## 2021-11-12 DIAGNOSIS — I480 Paroxysmal atrial fibrillation: Secondary | ICD-10-CM | POA: Diagnosis not present

## 2021-11-12 DIAGNOSIS — I2510 Atherosclerotic heart disease of native coronary artery without angina pectoris: Secondary | ICD-10-CM | POA: Diagnosis not present

## 2021-11-12 DIAGNOSIS — D696 Thrombocytopenia, unspecified: Secondary | ICD-10-CM | POA: Diagnosis not present

## 2021-11-12 DIAGNOSIS — G8929 Other chronic pain: Secondary | ICD-10-CM | POA: Diagnosis not present

## 2021-11-12 DIAGNOSIS — I442 Atrioventricular block, complete: Secondary | ICD-10-CM | POA: Diagnosis not present

## 2021-11-12 DIAGNOSIS — K589 Irritable bowel syndrome without diarrhea: Secondary | ICD-10-CM | POA: Diagnosis not present

## 2021-11-12 DIAGNOSIS — J45909 Unspecified asthma, uncomplicated: Secondary | ICD-10-CM | POA: Diagnosis not present

## 2021-11-12 DIAGNOSIS — I700 Atherosclerosis of aorta: Secondary | ICD-10-CM | POA: Diagnosis not present

## 2021-11-12 DIAGNOSIS — I428 Other cardiomyopathies: Secondary | ICD-10-CM | POA: Diagnosis not present

## 2021-11-12 DIAGNOSIS — G4733 Obstructive sleep apnea (adult) (pediatric): Secondary | ICD-10-CM | POA: Diagnosis not present

## 2021-11-12 DIAGNOSIS — N186 End stage renal disease: Secondary | ICD-10-CM | POA: Diagnosis not present

## 2021-11-12 DIAGNOSIS — S22079D Unspecified fracture of T9-T10 vertebra, subsequent encounter for fracture with routine healing: Secondary | ICD-10-CM | POA: Diagnosis not present

## 2021-11-12 DIAGNOSIS — L89322 Pressure ulcer of left buttock, stage 2: Secondary | ICD-10-CM | POA: Diagnosis not present

## 2021-11-12 DIAGNOSIS — L97529 Non-pressure chronic ulcer of other part of left foot with unspecified severity: Secondary | ICD-10-CM | POA: Diagnosis not present

## 2021-11-12 DIAGNOSIS — M1990 Unspecified osteoarthritis, unspecified site: Secondary | ICD-10-CM | POA: Diagnosis not present

## 2021-11-12 DIAGNOSIS — I959 Hypotension, unspecified: Secondary | ICD-10-CM | POA: Diagnosis not present

## 2021-11-12 DIAGNOSIS — K219 Gastro-esophageal reflux disease without esophagitis: Secondary | ICD-10-CM | POA: Diagnosis not present

## 2021-11-15 DIAGNOSIS — S22079D Unspecified fracture of T9-T10 vertebra, subsequent encounter for fracture with routine healing: Secondary | ICD-10-CM | POA: Diagnosis not present

## 2021-11-15 DIAGNOSIS — M109 Gout, unspecified: Secondary | ICD-10-CM | POA: Diagnosis not present

## 2021-11-15 DIAGNOSIS — I5042 Chronic combined systolic (congestive) and diastolic (congestive) heart failure: Secondary | ICD-10-CM | POA: Diagnosis not present

## 2021-11-15 DIAGNOSIS — L89322 Pressure ulcer of left buttock, stage 2: Secondary | ICD-10-CM | POA: Diagnosis not present

## 2021-11-15 DIAGNOSIS — J9611 Chronic respiratory failure with hypoxia: Secondary | ICD-10-CM | POA: Diagnosis not present

## 2021-11-15 DIAGNOSIS — M1990 Unspecified osteoarthritis, unspecified site: Secondary | ICD-10-CM | POA: Diagnosis not present

## 2021-11-15 DIAGNOSIS — I428 Other cardiomyopathies: Secondary | ICD-10-CM | POA: Diagnosis not present

## 2021-11-15 DIAGNOSIS — I480 Paroxysmal atrial fibrillation: Secondary | ICD-10-CM | POA: Diagnosis not present

## 2021-11-15 DIAGNOSIS — G8929 Other chronic pain: Secondary | ICD-10-CM | POA: Diagnosis not present

## 2021-11-15 DIAGNOSIS — J45909 Unspecified asthma, uncomplicated: Secondary | ICD-10-CM | POA: Diagnosis not present

## 2021-11-15 DIAGNOSIS — K219 Gastro-esophageal reflux disease without esophagitis: Secondary | ICD-10-CM | POA: Diagnosis not present

## 2021-11-15 DIAGNOSIS — I132 Hypertensive heart and chronic kidney disease with heart failure and with stage 5 chronic kidney disease, or end stage renal disease: Secondary | ICD-10-CM | POA: Diagnosis not present

## 2021-11-15 DIAGNOSIS — E7800 Pure hypercholesterolemia, unspecified: Secondary | ICD-10-CM | POA: Diagnosis not present

## 2021-11-15 DIAGNOSIS — I2510 Atherosclerotic heart disease of native coronary artery without angina pectoris: Secondary | ICD-10-CM | POA: Diagnosis not present

## 2021-11-15 DIAGNOSIS — N186 End stage renal disease: Secondary | ICD-10-CM | POA: Diagnosis not present

## 2021-11-15 DIAGNOSIS — G4733 Obstructive sleep apnea (adult) (pediatric): Secondary | ICD-10-CM | POA: Diagnosis not present

## 2021-11-15 DIAGNOSIS — K589 Irritable bowel syndrome without diarrhea: Secondary | ICD-10-CM | POA: Diagnosis not present

## 2021-11-15 DIAGNOSIS — D519 Vitamin B12 deficiency anemia, unspecified: Secondary | ICD-10-CM | POA: Diagnosis not present

## 2021-11-15 DIAGNOSIS — D696 Thrombocytopenia, unspecified: Secondary | ICD-10-CM | POA: Diagnosis not present

## 2021-11-15 DIAGNOSIS — I442 Atrioventricular block, complete: Secondary | ICD-10-CM | POA: Diagnosis not present

## 2021-11-15 DIAGNOSIS — I959 Hypotension, unspecified: Secondary | ICD-10-CM | POA: Diagnosis not present

## 2021-11-15 DIAGNOSIS — E1122 Type 2 diabetes mellitus with diabetic chronic kidney disease: Secondary | ICD-10-CM | POA: Diagnosis not present

## 2021-11-18 ENCOUNTER — Ambulatory Visit (INDEPENDENT_AMBULATORY_CARE_PROVIDER_SITE_OTHER): Payer: Medicare Other | Admitting: Podiatry

## 2021-11-18 DIAGNOSIS — E1142 Type 2 diabetes mellitus with diabetic polyneuropathy: Secondary | ICD-10-CM

## 2021-11-18 DIAGNOSIS — D696 Thrombocytopenia, unspecified: Secondary | ICD-10-CM | POA: Diagnosis not present

## 2021-11-18 DIAGNOSIS — L97521 Non-pressure chronic ulcer of other part of left foot limited to breakdown of skin: Secondary | ICD-10-CM

## 2021-11-18 DIAGNOSIS — B351 Tinea unguium: Secondary | ICD-10-CM | POA: Diagnosis not present

## 2021-11-18 DIAGNOSIS — N179 Acute kidney failure, unspecified: Secondary | ICD-10-CM

## 2021-11-18 DIAGNOSIS — M79675 Pain in left toe(s): Secondary | ICD-10-CM

## 2021-11-18 DIAGNOSIS — M79674 Pain in right toe(s): Secondary | ICD-10-CM

## 2021-11-18 DIAGNOSIS — E11621 Type 2 diabetes mellitus with foot ulcer: Secondary | ICD-10-CM

## 2021-11-18 DIAGNOSIS — N189 Chronic kidney disease, unspecified: Secondary | ICD-10-CM

## 2021-11-18 NOTE — Progress Notes (Signed)
This patient returns to my office for at risk foot care.  This patient requires this care by a professional since this patient will be at risk due to having diabetes,ESRD and thrombocytopenia.  This patient is unable to cut nails herself since the patient cannot reach her nails.These nails are painful walking and wearing shoes.  She presents to the office in a wheelchair with oxygen. This patient presents to the office for evaluation of her second toe left foot.  The toe is red and swollen with no drainage or bleeding noted. She says her doctor  told her to be evaluated in this office and allow [place] provide treatment.  This patient presents for at risk foot care today.  General Appearance  Alert, conversant and in no acute stress.  Vascular  Dorsalis pedis and posterior tibial  pulses are  not palpable due to swelling  bilaterally.  Capillary return is within normal limits  bilaterally. Temperature is within normal limits  bilaterally.  Neurologic  Senn-Weinstein monofilament wire test diminished  bilaterally. Muscle power within normal limits bilaterally.  Nails Thick disfigured discolored nails with subungual debris  from hallux to fifth toes bilaterally. No evidence of bacterial infection or drainage bilaterally.  Orthopedic  No limitations of motion  feet .  No crepitus or effusions noted.  No bony pathology or digital deformities noted.  Hammer toes  B/L.  Skin  normotropic skin with no porokeratosis noted bilaterally.  No signs of infections or ulcers noted.   Diabetic ulcer second toe left foot proximal to nail.  Ulcer is healing with no evidence of drainage or bleeding noted.  Cellulitis second toe left foot.  Onychomycosis  Pain in right toes  Pain in left toes  Cellulitis with diabetic ulcer second toe left foot.  Consent was obtained for treatment procedures.   Mechanical debridement of nails 1-5  bilaterally performed with a nail nipper.  Filed with dremel without incident. DSD applied  second toe left foot. Prescribe clindamycin for cellulitis.  She is allergic to other antibiotics and clindamycin has the least kidney activity  Dr.  Sherryle Lis helped to pick the antibiotic.  She is to return to the office if the toe infection worsens.     Return office visit   4 months                     Told patient to return for periodic foot care and evaluation due to potential at risk complications.   Gardiner Barefoot DPM

## 2021-11-19 ENCOUNTER — Telehealth: Payer: Self-pay

## 2021-11-19 ENCOUNTER — Telehealth: Payer: Self-pay | Admitting: *Deleted

## 2021-11-19 ENCOUNTER — Encounter: Payer: Self-pay | Admitting: *Deleted

## 2021-11-19 DIAGNOSIS — J9611 Chronic respiratory failure with hypoxia: Secondary | ICD-10-CM | POA: Diagnosis not present

## 2021-11-19 DIAGNOSIS — S22079D Unspecified fracture of T9-T10 vertebra, subsequent encounter for fracture with routine healing: Secondary | ICD-10-CM | POA: Diagnosis not present

## 2021-11-19 DIAGNOSIS — M1990 Unspecified osteoarthritis, unspecified site: Secondary | ICD-10-CM | POA: Diagnosis not present

## 2021-11-19 DIAGNOSIS — M109 Gout, unspecified: Secondary | ICD-10-CM | POA: Diagnosis not present

## 2021-11-19 DIAGNOSIS — D696 Thrombocytopenia, unspecified: Secondary | ICD-10-CM | POA: Diagnosis not present

## 2021-11-19 DIAGNOSIS — G4733 Obstructive sleep apnea (adult) (pediatric): Secondary | ICD-10-CM | POA: Diagnosis not present

## 2021-11-19 DIAGNOSIS — I2510 Atherosclerotic heart disease of native coronary artery without angina pectoris: Secondary | ICD-10-CM | POA: Diagnosis not present

## 2021-11-19 DIAGNOSIS — N186 End stage renal disease: Secondary | ICD-10-CM | POA: Diagnosis not present

## 2021-11-19 DIAGNOSIS — L89322 Pressure ulcer of left buttock, stage 2: Secondary | ICD-10-CM | POA: Diagnosis not present

## 2021-11-19 DIAGNOSIS — I480 Paroxysmal atrial fibrillation: Secondary | ICD-10-CM | POA: Diagnosis not present

## 2021-11-19 DIAGNOSIS — I132 Hypertensive heart and chronic kidney disease with heart failure and with stage 5 chronic kidney disease, or end stage renal disease: Secondary | ICD-10-CM | POA: Diagnosis not present

## 2021-11-19 DIAGNOSIS — J45909 Unspecified asthma, uncomplicated: Secondary | ICD-10-CM | POA: Diagnosis not present

## 2021-11-19 DIAGNOSIS — I959 Hypotension, unspecified: Secondary | ICD-10-CM | POA: Diagnosis not present

## 2021-11-19 DIAGNOSIS — G8929 Other chronic pain: Secondary | ICD-10-CM | POA: Diagnosis not present

## 2021-11-19 DIAGNOSIS — I509 Heart failure, unspecified: Secondary | ICD-10-CM

## 2021-11-19 DIAGNOSIS — I442 Atrioventricular block, complete: Secondary | ICD-10-CM | POA: Diagnosis not present

## 2021-11-19 DIAGNOSIS — K219 Gastro-esophageal reflux disease without esophagitis: Secondary | ICD-10-CM | POA: Diagnosis not present

## 2021-11-19 DIAGNOSIS — I5042 Chronic combined systolic (congestive) and diastolic (congestive) heart failure: Secondary | ICD-10-CM | POA: Diagnosis not present

## 2021-11-19 DIAGNOSIS — I428 Other cardiomyopathies: Secondary | ICD-10-CM | POA: Diagnosis not present

## 2021-11-19 DIAGNOSIS — E7800 Pure hypercholesterolemia, unspecified: Secondary | ICD-10-CM | POA: Diagnosis not present

## 2021-11-19 DIAGNOSIS — E1122 Type 2 diabetes mellitus with diabetic chronic kidney disease: Secondary | ICD-10-CM | POA: Diagnosis not present

## 2021-11-19 DIAGNOSIS — K589 Irritable bowel syndrome without diarrhea: Secondary | ICD-10-CM | POA: Diagnosis not present

## 2021-11-19 DIAGNOSIS — D519 Vitamin B12 deficiency anemia, unspecified: Secondary | ICD-10-CM | POA: Diagnosis not present

## 2021-11-19 NOTE — Patient Instructions (Signed)
Visit Information  Thank you for taking time to visit with me today. Please don't hesitate to contact me if I can be of assistance to you.   Following are the goals we discussed today:   Goals Addressed               This Visit's Progress     COMPLETED: Food resources/need flu shot (pt-stated)        Care Coordination Interventions: Advised patient to contact her providers office to scheduled her AWV. Also encourage pt to seek referral for a nutritionist to discuss more options for her dietary measures for a renal diet if this is something needed in the future for specific food items. Reviewed medications with patient and discussed adherence with all medications and educated accordingly Reviewed scheduled/upcoming provider appointments including pending appointments. Care Guide referral for food insecurities for food panties or resources to supplement her needs for renal dietary measures.  Screening for signs and symptoms of depression related to chronic disease state  Assessed social determinant of health barriers Pt gets home visit with her [place] provider. RN encouraged pt to inquire on receiving the FLU shot on one of those visits due to the difficulty in getting out of her home to the local [facility] or drug store for this vaccine. Pt will inquire with her provider's office.          Please call the care guide team at [phone] if you need to cancel or reschedule your appointment.   If you are experiencing a Mental Health or [place] or need someone to talk to, please call the Suicide and Crisis Lifeline: 988  The patient verbalized understanding of instructions, educational materials, and care plan provided today and DECLINED offer to receive copy of patient instructions, educational materials, and care plan.   No further follow up required: No further needs  Raina Mina, RN Care Management Coordinator [place] Office [phone]

## 2021-11-19 NOTE — Telephone Encounter (Signed)
   Telephone encounter was:  Successful.  11/19/2021 Name: [name] MRN: [id number] DOB: 1945/10/23  [name] is a 76 y.o. year old female who is a primary care patient of [name], [name], MD . The community resource team was consulted for assistance with [place] guide performed the following interventions: Patient provided with information about care guide support team and interviewed to confirm resource needs Discussed resources to assist with Food support such as [employer], Food Pantries, and [facility] Meals. Placed referral to [employer] via E-mail . However, [employer] does not provide diet specific meals. Therefore, CG will send [facility] meals a request now for renal friendly meals for 2 months as patient needs immediate support. Patient has been advised. CG has sent out the [place] to patient by mail. Patient is aware and has been advised: I have mailed the following information and if she has not received the information in 7 to 14 days or if she has any additional questions to please call me back at [phone]. Patient understood.  Follow Up Plan:  No further follow up planned at this time. The patient has been provided with needed resources.  [place] management  [place], [place]  Main Phone: [phone]  E-mail: [email or web address]  Website: [email or web address]

## 2021-11-19 NOTE — Patient Outreach (Signed)
  Care Coordination   Initial Visit Note   11/19/2021 Name: [name] MRN: [id number] DOB: [birth year]  [name] is a 76 y.o. year old female who sees [name], [name], MD for primary care. I spoke with  [name] by phone today.  What matters to the patients health and wellness today?  Food resources and flu shot accommodations   Goals Addressed               This Visit's Progress     COMPLETED: Food resources/need flu shot (pt-stated)        Care Coordination Interventions: Advised patient to contact her providers office to scheduled her AWV. Also encourage pt to seek referral for a nutritionist to discuss more options for her dietary measures for a renal diet if this is something needed in the future for specific food items. Reviewed medications with patient and discussed adherence with all medications and educated accordingly Reviewed scheduled/upcoming provider appointments including pending appointments. Care Guide referral for food insecurities for food panties or resources to supplement her needs for renal dietary measures.  Screening for signs and symptoms of depression related to chronic disease state  Assessed social determinant of health barriers Pt gets home visit with her [place] provider. RN encouraged pt to inquire on receiving the FLU shot on one of those visits due to the difficulty in getting out of her home to the local [facility] or drug store for this vaccine. Pt will inquire with her provider's office.          SDOH assessments and interventions completed:  Yes  SDOH Interventions Today    Flowsheet Row Most Recent Value  SDOH Interventions   Food Insecurity Interventions Ambulatory [id number] Order  Housing Interventions Intervention Not Indicated  Transportation Interventions Intervention Not Indicated  Utilities Interventions Intervention Not Indicated        Care Coordination Interventions Activated:  Yes  Care Coordination  Interventions:  Yes, provided   Follow up plan: No further intervention required.   Encounter Outcome:  Pt. Visit Completed   Raina Mina, RN Care Management Coordinator [place] Office [phone]

## 2021-11-20 ENCOUNTER — Telehealth: Payer: Self-pay | Admitting: *Deleted

## 2021-11-20 ENCOUNTER — Other Ambulatory Visit: Payer: Self-pay | Admitting: Podiatry

## 2021-11-20 DIAGNOSIS — E7800 Pure hypercholesterolemia, unspecified: Secondary | ICD-10-CM | POA: Diagnosis not present

## 2021-11-20 DIAGNOSIS — J9611 Chronic respiratory failure with hypoxia: Secondary | ICD-10-CM | POA: Diagnosis not present

## 2021-11-20 DIAGNOSIS — M1990 Unspecified osteoarthritis, unspecified site: Secondary | ICD-10-CM | POA: Diagnosis not present

## 2021-11-20 DIAGNOSIS — M109 Gout, unspecified: Secondary | ICD-10-CM | POA: Diagnosis not present

## 2021-11-20 DIAGNOSIS — S22079D Unspecified fracture of T9-T10 vertebra, subsequent encounter for fracture with routine healing: Secondary | ICD-10-CM | POA: Diagnosis not present

## 2021-11-20 DIAGNOSIS — G4733 Obstructive sleep apnea (adult) (pediatric): Secondary | ICD-10-CM | POA: Diagnosis not present

## 2021-11-20 DIAGNOSIS — L89322 Pressure ulcer of left buttock, stage 2: Secondary | ICD-10-CM | POA: Diagnosis not present

## 2021-11-20 DIAGNOSIS — J45909 Unspecified asthma, uncomplicated: Secondary | ICD-10-CM | POA: Diagnosis not present

## 2021-11-20 DIAGNOSIS — K219 Gastro-esophageal reflux disease without esophagitis: Secondary | ICD-10-CM | POA: Diagnosis not present

## 2021-11-20 DIAGNOSIS — E1122 Type 2 diabetes mellitus with diabetic chronic kidney disease: Secondary | ICD-10-CM | POA: Diagnosis not present

## 2021-11-20 DIAGNOSIS — D696 Thrombocytopenia, unspecified: Secondary | ICD-10-CM | POA: Diagnosis not present

## 2021-11-20 DIAGNOSIS — K589 Irritable bowel syndrome without diarrhea: Secondary | ICD-10-CM | POA: Diagnosis not present

## 2021-11-20 DIAGNOSIS — I5042 Chronic combined systolic (congestive) and diastolic (congestive) heart failure: Secondary | ICD-10-CM | POA: Diagnosis not present

## 2021-11-20 DIAGNOSIS — I2510 Atherosclerotic heart disease of native coronary artery without angina pectoris: Secondary | ICD-10-CM | POA: Diagnosis not present

## 2021-11-20 DIAGNOSIS — I442 Atrioventricular block, complete: Secondary | ICD-10-CM | POA: Diagnosis not present

## 2021-11-20 DIAGNOSIS — N186 End stage renal disease: Secondary | ICD-10-CM | POA: Diagnosis not present

## 2021-11-20 DIAGNOSIS — I480 Paroxysmal atrial fibrillation: Secondary | ICD-10-CM | POA: Diagnosis not present

## 2021-11-20 DIAGNOSIS — I428 Other cardiomyopathies: Secondary | ICD-10-CM | POA: Diagnosis not present

## 2021-11-20 DIAGNOSIS — I132 Hypertensive heart and chronic kidney disease with heart failure and with stage 5 chronic kidney disease, or end stage renal disease: Secondary | ICD-10-CM | POA: Diagnosis not present

## 2021-11-20 DIAGNOSIS — I959 Hypotension, unspecified: Secondary | ICD-10-CM | POA: Diagnosis not present

## 2021-11-20 DIAGNOSIS — D519 Vitamin B12 deficiency anemia, unspecified: Secondary | ICD-10-CM | POA: Diagnosis not present

## 2021-11-20 DIAGNOSIS — G8929 Other chronic pain: Secondary | ICD-10-CM | POA: Diagnosis not present

## 2021-11-20 MED ORDER — CLINDAMYCIN HCL 300 MG PO CAPS: 300.0000 mg | ORAL_CAPSULE | Freq: Three times a day (TID) | ORAL | 0 refills | Status: DC

## 2021-11-20 NOTE — Telephone Encounter (Signed)
Nurse with [facility] is calling because patient did not receive he antibiotic, was supposed to be sent to pharmacy, not there, please send according to notes-Clindamycin to pharmacy on file. Please advise.

## 2021-11-20 NOTE — Telephone Encounter (Signed)
Clindamycin '300mg'$  TID #30 sent to the pharmacy. - Dr. Amalia Hailey

## 2021-11-20 NOTE — Telephone Encounter (Signed)
3rd call from [place] to get antibiotic called in for this patient.

## 2021-11-20 NOTE — Telephone Encounter (Signed)
Please call pts cell when it has been sent in. Her number is [phone]

## 2021-11-21 DIAGNOSIS — M1990 Unspecified osteoarthritis, unspecified site: Secondary | ICD-10-CM | POA: Diagnosis not present

## 2021-11-21 DIAGNOSIS — S22079D Unspecified fracture of T9-T10 vertebra, subsequent encounter for fracture with routine healing: Secondary | ICD-10-CM | POA: Diagnosis not present

## 2021-11-21 DIAGNOSIS — D519 Vitamin B12 deficiency anemia, unspecified: Secondary | ICD-10-CM | POA: Diagnosis not present

## 2021-11-21 DIAGNOSIS — G8929 Other chronic pain: Secondary | ICD-10-CM | POA: Diagnosis not present

## 2021-11-21 DIAGNOSIS — I428 Other cardiomyopathies: Secondary | ICD-10-CM | POA: Diagnosis not present

## 2021-11-21 DIAGNOSIS — J9611 Chronic respiratory failure with hypoxia: Secondary | ICD-10-CM | POA: Diagnosis not present

## 2021-11-21 DIAGNOSIS — L89322 Pressure ulcer of left buttock, stage 2: Secondary | ICD-10-CM | POA: Diagnosis not present

## 2021-11-21 DIAGNOSIS — E1122 Type 2 diabetes mellitus with diabetic chronic kidney disease: Secondary | ICD-10-CM | POA: Diagnosis not present

## 2021-11-21 DIAGNOSIS — G4733 Obstructive sleep apnea (adult) (pediatric): Secondary | ICD-10-CM | POA: Diagnosis not present

## 2021-11-21 DIAGNOSIS — D696 Thrombocytopenia, unspecified: Secondary | ICD-10-CM | POA: Diagnosis not present

## 2021-11-21 DIAGNOSIS — I5042 Chronic combined systolic (congestive) and diastolic (congestive) heart failure: Secondary | ICD-10-CM | POA: Diagnosis not present

## 2021-11-21 DIAGNOSIS — I480 Paroxysmal atrial fibrillation: Secondary | ICD-10-CM | POA: Diagnosis not present

## 2021-11-21 DIAGNOSIS — I442 Atrioventricular block, complete: Secondary | ICD-10-CM | POA: Diagnosis not present

## 2021-11-21 DIAGNOSIS — N186 End stage renal disease: Secondary | ICD-10-CM | POA: Diagnosis not present

## 2021-11-21 DIAGNOSIS — K219 Gastro-esophageal reflux disease without esophagitis: Secondary | ICD-10-CM | POA: Diagnosis not present

## 2021-11-21 DIAGNOSIS — I132 Hypertensive heart and chronic kidney disease with heart failure and with stage 5 chronic kidney disease, or end stage renal disease: Secondary | ICD-10-CM | POA: Diagnosis not present

## 2021-11-21 DIAGNOSIS — K589 Irritable bowel syndrome without diarrhea: Secondary | ICD-10-CM | POA: Diagnosis not present

## 2021-11-21 DIAGNOSIS — I959 Hypotension, unspecified: Secondary | ICD-10-CM | POA: Diagnosis not present

## 2021-11-21 DIAGNOSIS — I2510 Atherosclerotic heart disease of native coronary artery without angina pectoris: Secondary | ICD-10-CM | POA: Diagnosis not present

## 2021-11-21 DIAGNOSIS — E7800 Pure hypercholesterolemia, unspecified: Secondary | ICD-10-CM | POA: Diagnosis not present

## 2021-11-21 DIAGNOSIS — J45909 Unspecified asthma, uncomplicated: Secondary | ICD-10-CM | POA: Diagnosis not present

## 2021-11-21 DIAGNOSIS — M109 Gout, unspecified: Secondary | ICD-10-CM | POA: Diagnosis not present

## 2021-11-22 DIAGNOSIS — M109 Gout, unspecified: Secondary | ICD-10-CM | POA: Diagnosis not present

## 2021-11-22 DIAGNOSIS — S22079D Unspecified fracture of T9-T10 vertebra, subsequent encounter for fracture with routine healing: Secondary | ICD-10-CM | POA: Diagnosis not present

## 2021-11-22 DIAGNOSIS — K589 Irritable bowel syndrome without diarrhea: Secondary | ICD-10-CM | POA: Diagnosis not present

## 2021-11-22 DIAGNOSIS — I5042 Chronic combined systolic (congestive) and diastolic (congestive) heart failure: Secondary | ICD-10-CM | POA: Diagnosis not present

## 2021-11-22 DIAGNOSIS — G4733 Obstructive sleep apnea (adult) (pediatric): Secondary | ICD-10-CM | POA: Diagnosis not present

## 2021-11-22 DIAGNOSIS — I480 Paroxysmal atrial fibrillation: Secondary | ICD-10-CM | POA: Diagnosis not present

## 2021-11-22 DIAGNOSIS — D519 Vitamin B12 deficiency anemia, unspecified: Secondary | ICD-10-CM | POA: Diagnosis not present

## 2021-11-22 DIAGNOSIS — K219 Gastro-esophageal reflux disease without esophagitis: Secondary | ICD-10-CM | POA: Diagnosis not present

## 2021-11-22 DIAGNOSIS — E1122 Type 2 diabetes mellitus with diabetic chronic kidney disease: Secondary | ICD-10-CM | POA: Diagnosis not present

## 2021-11-22 DIAGNOSIS — I959 Hypotension, unspecified: Secondary | ICD-10-CM | POA: Diagnosis not present

## 2021-11-22 DIAGNOSIS — D696 Thrombocytopenia, unspecified: Secondary | ICD-10-CM | POA: Diagnosis not present

## 2021-11-22 DIAGNOSIS — I428 Other cardiomyopathies: Secondary | ICD-10-CM | POA: Diagnosis not present

## 2021-11-22 DIAGNOSIS — L89322 Pressure ulcer of left buttock, stage 2: Secondary | ICD-10-CM | POA: Diagnosis not present

## 2021-11-22 DIAGNOSIS — J9611 Chronic respiratory failure with hypoxia: Secondary | ICD-10-CM | POA: Diagnosis not present

## 2021-11-22 DIAGNOSIS — I442 Atrioventricular block, complete: Secondary | ICD-10-CM | POA: Diagnosis not present

## 2021-11-22 DIAGNOSIS — I2510 Atherosclerotic heart disease of native coronary artery without angina pectoris: Secondary | ICD-10-CM | POA: Diagnosis not present

## 2021-11-22 DIAGNOSIS — I132 Hypertensive heart and chronic kidney disease with heart failure and with stage 5 chronic kidney disease, or end stage renal disease: Secondary | ICD-10-CM | POA: Diagnosis not present

## 2021-11-22 DIAGNOSIS — G8929 Other chronic pain: Secondary | ICD-10-CM | POA: Diagnosis not present

## 2021-11-22 DIAGNOSIS — J45909 Unspecified asthma, uncomplicated: Secondary | ICD-10-CM | POA: Diagnosis not present

## 2021-11-22 DIAGNOSIS — E7800 Pure hypercholesterolemia, unspecified: Secondary | ICD-10-CM | POA: Diagnosis not present

## 2021-11-22 DIAGNOSIS — M1990 Unspecified osteoarthritis, unspecified site: Secondary | ICD-10-CM | POA: Diagnosis not present

## 2021-11-22 DIAGNOSIS — N186 End stage renal disease: Secondary | ICD-10-CM | POA: Diagnosis not present

## 2021-11-27 DIAGNOSIS — N186 End stage renal disease: Secondary | ICD-10-CM | POA: Diagnosis not present

## 2021-11-27 DIAGNOSIS — G8929 Other chronic pain: Secondary | ICD-10-CM | POA: Diagnosis not present

## 2021-11-27 DIAGNOSIS — M109 Gout, unspecified: Secondary | ICD-10-CM | POA: Diagnosis not present

## 2021-11-27 DIAGNOSIS — J9611 Chronic respiratory failure with hypoxia: Secondary | ICD-10-CM | POA: Diagnosis not present

## 2021-11-27 DIAGNOSIS — G4733 Obstructive sleep apnea (adult) (pediatric): Secondary | ICD-10-CM | POA: Diagnosis not present

## 2021-11-27 DIAGNOSIS — S22079D Unspecified fracture of T9-T10 vertebra, subsequent encounter for fracture with routine healing: Secondary | ICD-10-CM | POA: Diagnosis not present

## 2021-11-27 DIAGNOSIS — I959 Hypotension, unspecified: Secondary | ICD-10-CM | POA: Diagnosis not present

## 2021-11-27 DIAGNOSIS — D519 Vitamin B12 deficiency anemia, unspecified: Secondary | ICD-10-CM | POA: Diagnosis not present

## 2021-11-27 DIAGNOSIS — D696 Thrombocytopenia, unspecified: Secondary | ICD-10-CM | POA: Diagnosis not present

## 2021-11-27 DIAGNOSIS — E1122 Type 2 diabetes mellitus with diabetic chronic kidney disease: Secondary | ICD-10-CM | POA: Diagnosis not present

## 2021-11-27 DIAGNOSIS — E7800 Pure hypercholesterolemia, unspecified: Secondary | ICD-10-CM | POA: Diagnosis not present

## 2021-11-27 DIAGNOSIS — M1990 Unspecified osteoarthritis, unspecified site: Secondary | ICD-10-CM | POA: Diagnosis not present

## 2021-11-27 DIAGNOSIS — J45909 Unspecified asthma, uncomplicated: Secondary | ICD-10-CM | POA: Diagnosis not present

## 2021-11-27 DIAGNOSIS — K589 Irritable bowel syndrome without diarrhea: Secondary | ICD-10-CM | POA: Diagnosis not present

## 2021-11-27 DIAGNOSIS — I2510 Atherosclerotic heart disease of native coronary artery without angina pectoris: Secondary | ICD-10-CM | POA: Diagnosis not present

## 2021-11-27 DIAGNOSIS — I132 Hypertensive heart and chronic kidney disease with heart failure and with stage 5 chronic kidney disease, or end stage renal disease: Secondary | ICD-10-CM | POA: Diagnosis not present

## 2021-11-27 DIAGNOSIS — I442 Atrioventricular block, complete: Secondary | ICD-10-CM | POA: Diagnosis not present

## 2021-11-27 DIAGNOSIS — I480 Paroxysmal atrial fibrillation: Secondary | ICD-10-CM | POA: Diagnosis not present

## 2021-11-27 DIAGNOSIS — L89322 Pressure ulcer of left buttock, stage 2: Secondary | ICD-10-CM | POA: Diagnosis not present

## 2021-11-27 DIAGNOSIS — I428 Other cardiomyopathies: Secondary | ICD-10-CM | POA: Diagnosis not present

## 2021-11-27 DIAGNOSIS — I5042 Chronic combined systolic (congestive) and diastolic (congestive) heart failure: Secondary | ICD-10-CM | POA: Diagnosis not present

## 2021-11-27 DIAGNOSIS — K219 Gastro-esophageal reflux disease without esophagitis: Secondary | ICD-10-CM | POA: Diagnosis not present

## 2021-11-28 DIAGNOSIS — I5042 Chronic combined systolic (congestive) and diastolic (congestive) heart failure: Secondary | ICD-10-CM | POA: Diagnosis not present

## 2021-11-28 DIAGNOSIS — D696 Thrombocytopenia, unspecified: Secondary | ICD-10-CM | POA: Diagnosis not present

## 2021-11-28 DIAGNOSIS — K219 Gastro-esophageal reflux disease without esophagitis: Secondary | ICD-10-CM | POA: Diagnosis not present

## 2021-11-28 DIAGNOSIS — I2510 Atherosclerotic heart disease of native coronary artery without angina pectoris: Secondary | ICD-10-CM | POA: Diagnosis not present

## 2021-11-28 DIAGNOSIS — K589 Irritable bowel syndrome without diarrhea: Secondary | ICD-10-CM | POA: Diagnosis not present

## 2021-11-28 DIAGNOSIS — I428 Other cardiomyopathies: Secondary | ICD-10-CM | POA: Diagnosis not present

## 2021-11-28 DIAGNOSIS — L89322 Pressure ulcer of left buttock, stage 2: Secondary | ICD-10-CM | POA: Diagnosis not present

## 2021-11-28 DIAGNOSIS — G4733 Obstructive sleep apnea (adult) (pediatric): Secondary | ICD-10-CM | POA: Diagnosis not present

## 2021-11-28 DIAGNOSIS — E1122 Type 2 diabetes mellitus with diabetic chronic kidney disease: Secondary | ICD-10-CM | POA: Diagnosis not present

## 2021-11-28 DIAGNOSIS — N186 End stage renal disease: Secondary | ICD-10-CM | POA: Diagnosis not present

## 2021-11-28 DIAGNOSIS — S22079D Unspecified fracture of T9-T10 vertebra, subsequent encounter for fracture with routine healing: Secondary | ICD-10-CM | POA: Diagnosis not present

## 2021-11-28 DIAGNOSIS — M1990 Unspecified osteoarthritis, unspecified site: Secondary | ICD-10-CM | POA: Diagnosis not present

## 2021-11-28 DIAGNOSIS — E7800 Pure hypercholesterolemia, unspecified: Secondary | ICD-10-CM | POA: Diagnosis not present

## 2021-11-28 DIAGNOSIS — I132 Hypertensive heart and chronic kidney disease with heart failure and with stage 5 chronic kidney disease, or end stage renal disease: Secondary | ICD-10-CM | POA: Diagnosis not present

## 2021-11-28 DIAGNOSIS — J9611 Chronic respiratory failure with hypoxia: Secondary | ICD-10-CM | POA: Diagnosis not present

## 2021-11-28 DIAGNOSIS — M109 Gout, unspecified: Secondary | ICD-10-CM | POA: Diagnosis not present

## 2021-11-28 DIAGNOSIS — G8929 Other chronic pain: Secondary | ICD-10-CM | POA: Diagnosis not present

## 2021-11-28 DIAGNOSIS — I959 Hypotension, unspecified: Secondary | ICD-10-CM | POA: Diagnosis not present

## 2021-11-28 DIAGNOSIS — D519 Vitamin B12 deficiency anemia, unspecified: Secondary | ICD-10-CM | POA: Diagnosis not present

## 2021-11-28 DIAGNOSIS — I442 Atrioventricular block, complete: Secondary | ICD-10-CM | POA: Diagnosis not present

## 2021-11-28 DIAGNOSIS — I480 Paroxysmal atrial fibrillation: Secondary | ICD-10-CM | POA: Diagnosis not present

## 2021-11-28 DIAGNOSIS — J45909 Unspecified asthma, uncomplicated: Secondary | ICD-10-CM | POA: Diagnosis not present

## 2021-11-29 DIAGNOSIS — G4733 Obstructive sleep apnea (adult) (pediatric): Secondary | ICD-10-CM | POA: Diagnosis not present

## 2021-11-29 DIAGNOSIS — I959 Hypotension, unspecified: Secondary | ICD-10-CM | POA: Diagnosis not present

## 2021-11-29 DIAGNOSIS — I442 Atrioventricular block, complete: Secondary | ICD-10-CM | POA: Diagnosis not present

## 2021-11-29 DIAGNOSIS — D696 Thrombocytopenia, unspecified: Secondary | ICD-10-CM | POA: Diagnosis not present

## 2021-11-29 DIAGNOSIS — N186 End stage renal disease: Secondary | ICD-10-CM | POA: Diagnosis not present

## 2021-11-29 DIAGNOSIS — I5042 Chronic combined systolic (congestive) and diastolic (congestive) heart failure: Secondary | ICD-10-CM | POA: Diagnosis not present

## 2021-11-29 DIAGNOSIS — S22079D Unspecified fracture of T9-T10 vertebra, subsequent encounter for fracture with routine healing: Secondary | ICD-10-CM | POA: Diagnosis not present

## 2021-11-29 DIAGNOSIS — E7800 Pure hypercholesterolemia, unspecified: Secondary | ICD-10-CM | POA: Diagnosis not present

## 2021-11-29 DIAGNOSIS — G8929 Other chronic pain: Secondary | ICD-10-CM | POA: Diagnosis not present

## 2021-11-29 DIAGNOSIS — M1990 Unspecified osteoarthritis, unspecified site: Secondary | ICD-10-CM | POA: Diagnosis not present

## 2021-11-29 DIAGNOSIS — I480 Paroxysmal atrial fibrillation: Secondary | ICD-10-CM | POA: Diagnosis not present

## 2021-11-29 DIAGNOSIS — K219 Gastro-esophageal reflux disease without esophagitis: Secondary | ICD-10-CM | POA: Diagnosis not present

## 2021-11-29 DIAGNOSIS — I428 Other cardiomyopathies: Secondary | ICD-10-CM | POA: Diagnosis not present

## 2021-11-29 DIAGNOSIS — J45909 Unspecified asthma, uncomplicated: Secondary | ICD-10-CM | POA: Diagnosis not present

## 2021-11-29 DIAGNOSIS — I2510 Atherosclerotic heart disease of native coronary artery without angina pectoris: Secondary | ICD-10-CM | POA: Diagnosis not present

## 2021-11-29 DIAGNOSIS — J9611 Chronic respiratory failure with hypoxia: Secondary | ICD-10-CM | POA: Diagnosis not present

## 2021-11-29 DIAGNOSIS — I132 Hypertensive heart and chronic kidney disease with heart failure and with stage 5 chronic kidney disease, or end stage renal disease: Secondary | ICD-10-CM | POA: Diagnosis not present

## 2021-11-29 DIAGNOSIS — M109 Gout, unspecified: Secondary | ICD-10-CM | POA: Diagnosis not present

## 2021-11-29 DIAGNOSIS — D519 Vitamin B12 deficiency anemia, unspecified: Secondary | ICD-10-CM | POA: Diagnosis not present

## 2021-11-29 DIAGNOSIS — K589 Irritable bowel syndrome without diarrhea: Secondary | ICD-10-CM | POA: Diagnosis not present

## 2021-11-29 DIAGNOSIS — L89322 Pressure ulcer of left buttock, stage 2: Secondary | ICD-10-CM | POA: Diagnosis not present

## 2021-11-29 DIAGNOSIS — E1122 Type 2 diabetes mellitus with diabetic chronic kidney disease: Secondary | ICD-10-CM | POA: Diagnosis not present

## 2021-12-01 IMAGING — RF DG ESOPHAGUS
6 series · 12 of 19 positions shown · non-contrast
Comparison: Esophagram 11/04/2019

CLINICAL DATA: Worsening dysphagia. Feeling of food sticking in
throat.

EXAM:
ESOPHOGRAM/BARIUM SWALLOW
TECHNIQUE: Single contrast examination was performed using  thin barium.
FLUOROSCOPY TIME:  Fluoroscopy Time:  1 minute 42 seconds
Radiation Exposure Index (if provided by the fluoroscopic device):
113.8 mGy
Number of Acquired Spot Images: 6

[Series 1: cp_standard · 0.36mm/px · 3 of 207 frames shown (1 of 2)]
[frame 32/207]
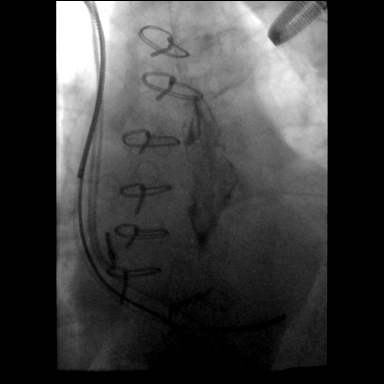
[frame 104/207]
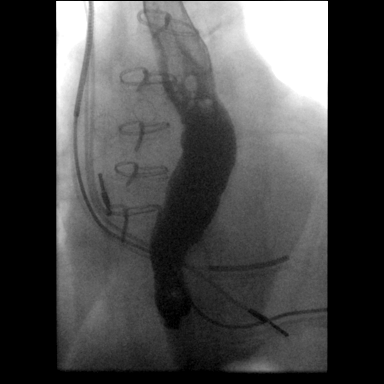
[frame 176/207]
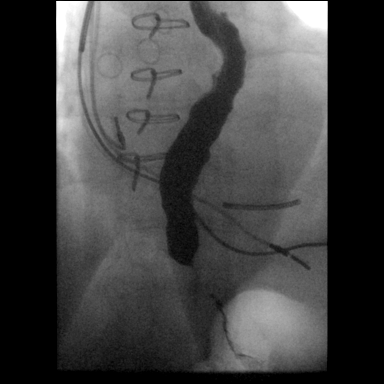

[Series 3: cp_standard · 0.36mm/px · 2 of 94 frames shown (2 of 2)]
[frame 16/94]
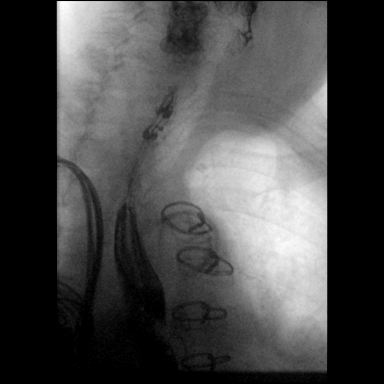
[frame 80/94]
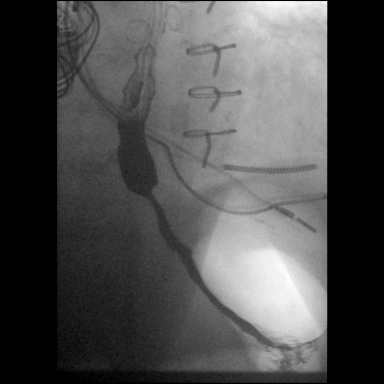

[Series 4: fluoro_barium 2fps_bw · 0.18mm/px · 1 of 2 frames shown (1 of 4)]
[frame 1/2]
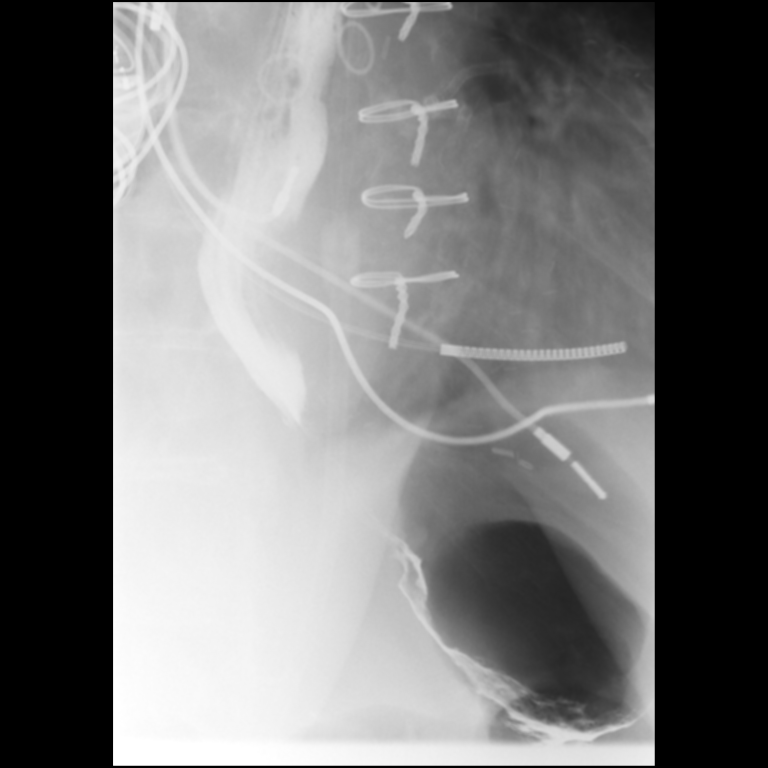

[Series 4: fluoro_barium 2fps_bw · 0.18mm/px · 2 of 2 frames shown (2 of 4)]
[frame 1/2]
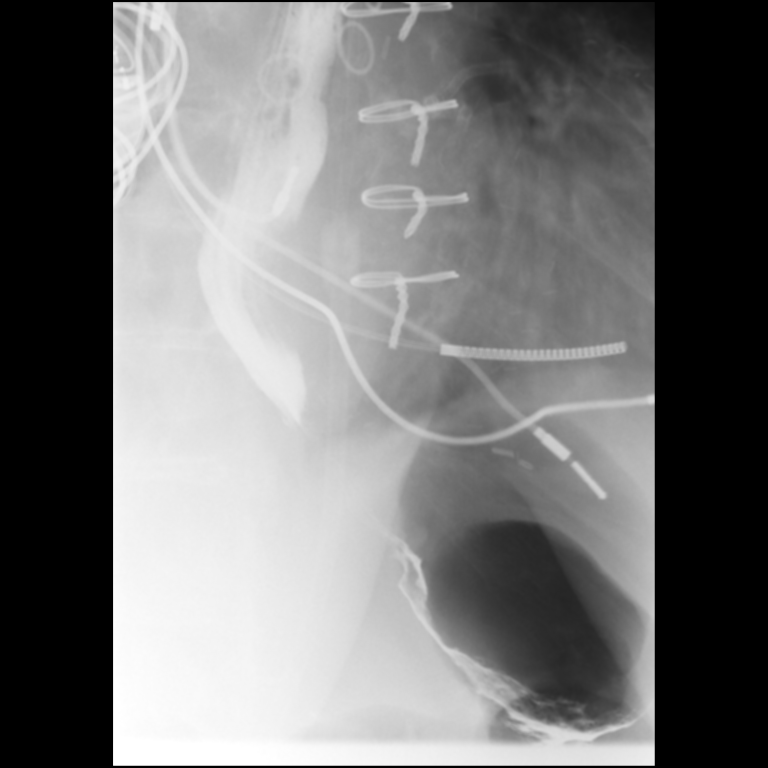
[frame 2/2]
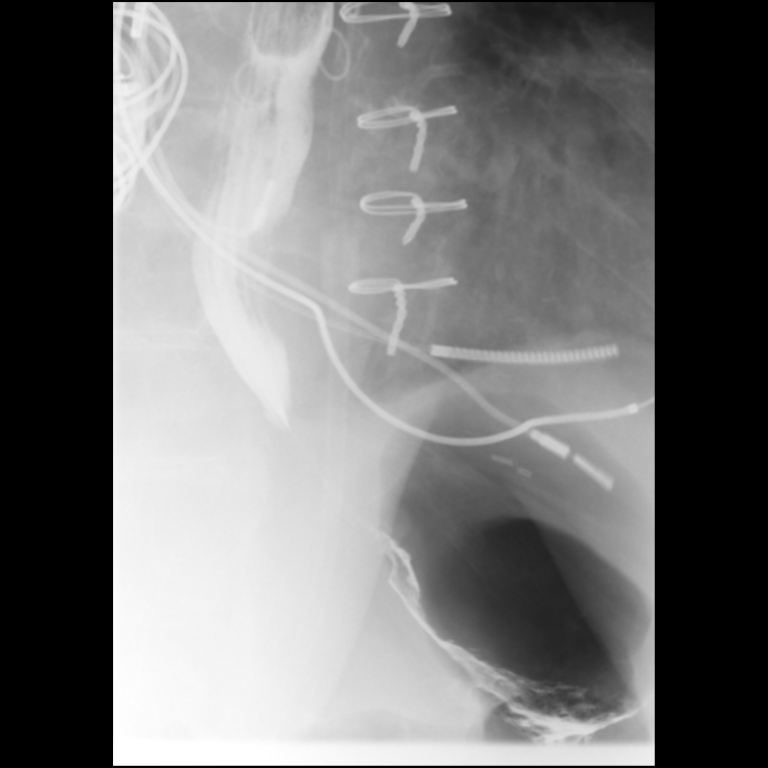

[Series 5: fluoro_barium 2fps_bw · 0.18mm/px · 2 of 6 frames shown (3 of 4)]
[frame 3/6]
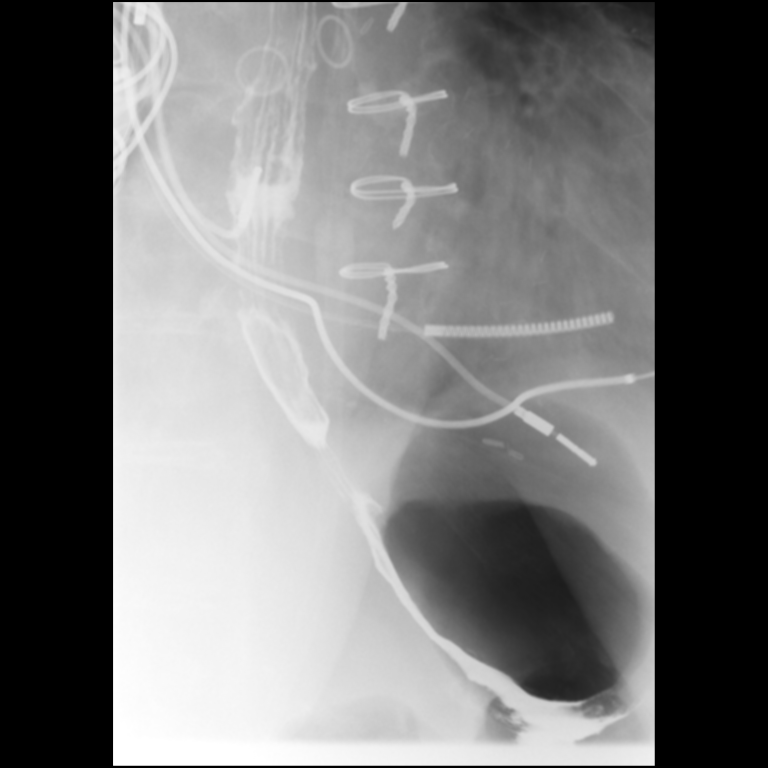
[frame 6/6]
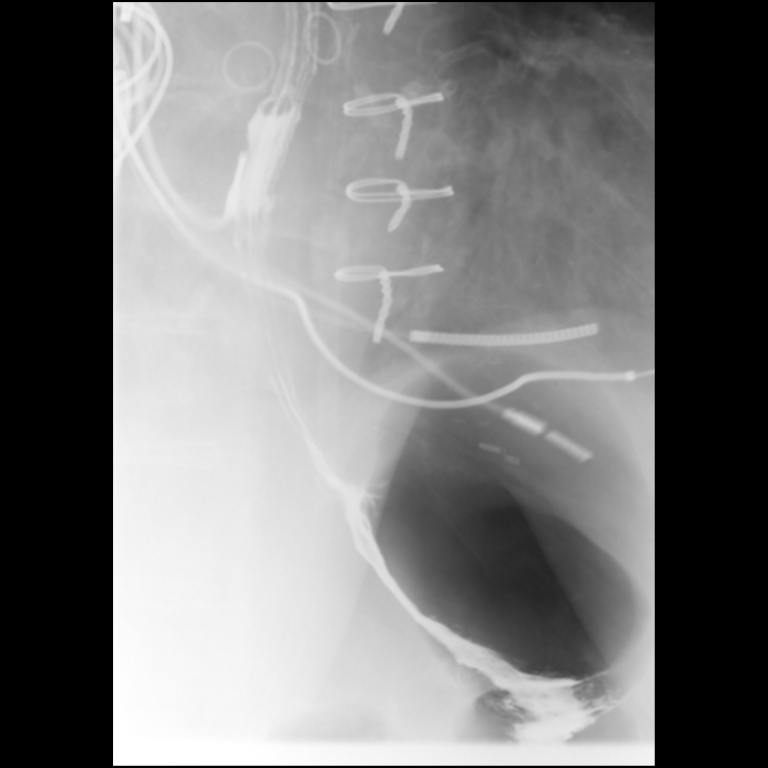

[Series 6: fluoro_barium 2fps_bw · 0.18mm/px · 2 of 4 frames shown (4 of 4)]
[frame 1/4]
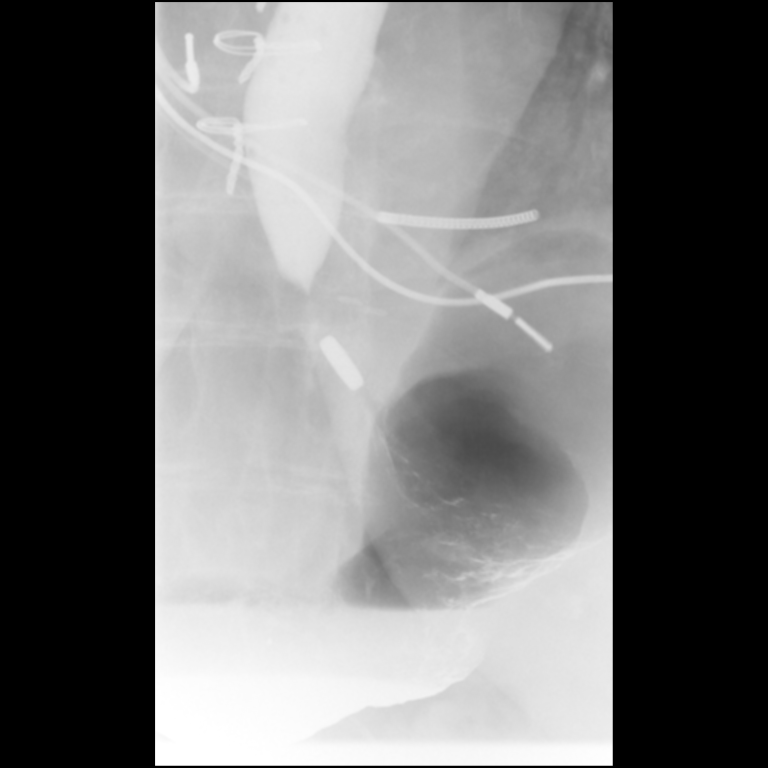
[frame 4/4]
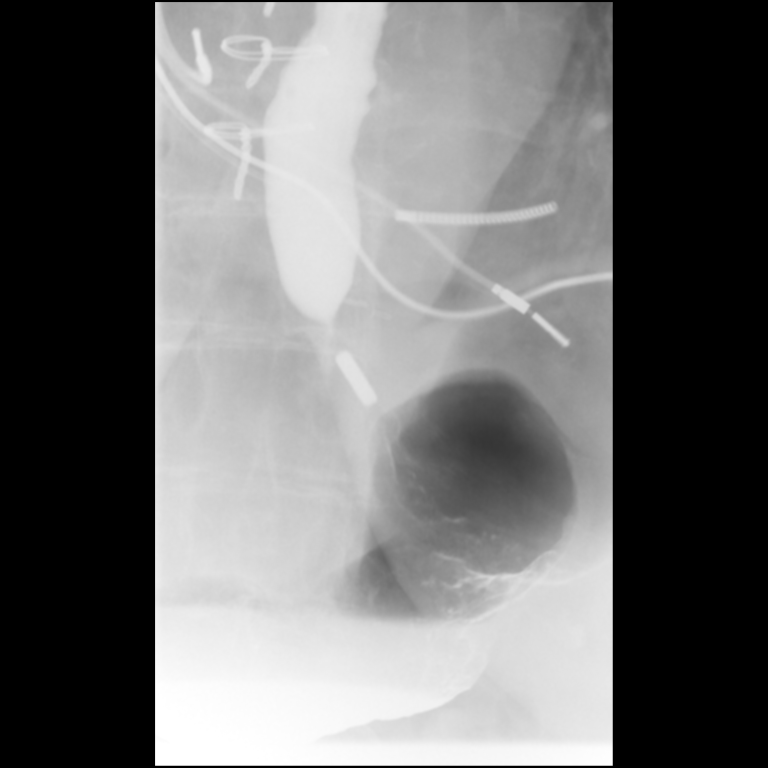

[12 of 19 positions shown; findings below may reference images not displayed]

FINDINGS: Patient limited mobility.  Single contrast exam performed.

No swallowing dysfunction in the high cervical esophagus. No mucosal
irregularity identified in the thoracic os of distal esophagus.

There is stricturing in the distal esophagus over a 1.5 cm segment
leading up to the GE junction.

Suggestion of Schatzki's ring at the beginning of the stricturing.

On subsequent swallows, the distal esophagus expanded and allowed
flow of the oral contrast.

13 mm barium tablet hesitated within the distal esophageal
stricturing; however, the barium tablet passed with sips of water
and thin barium.
IMPRESSION: 1. Stricturing of the distal esophagus similar to comparison exam;
however, the barium tablet passed GE junction. Findings suggestive
of reflux induced stricture of the distal esophagus.
2. No mucosal irregularity identified the thoracic esophagus or
distal esophagus.
3. Consider endoscopy for further evaluation

## 2021-12-01 IMAGING — RF DG SWALLOWING FUNCTION
24 series · 24 of 24 positions shown · non-contrast
Comparison: None.

CLINICAL DATA: Dysphagia. Cough/GE reflux disease/other secondary
diagnosis

EXAM:
MODIFIED BARIUM SWALLOW
TECHNIQUE: Different consistencies of barium were administered orally to the
patient by the Speech Pathologist. Imaging of the pharynx was
performed in the lateral projection. The radiologist was present in
the fluoroscopy room for this study, providing personal supervision.
FLUOROSCOPY TIME:  Fluoroscopy Time:  54 seconds
Radiation Exposure Index (if provided by the fluoroscopic device):
2.5 mGy
Number of Acquired Spot Images: 0

[Series 7: cp_standard · 0.34mm/px · 1 of 70 frames shown (1 of 24)]
[frame 11/70]
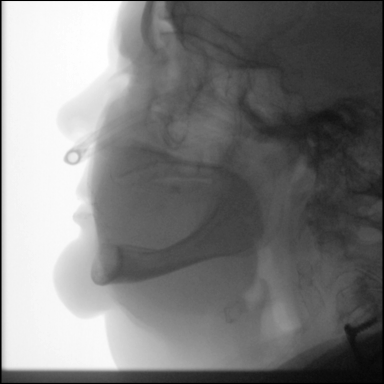

[Series 7: cp_standard · 0.34mm/px · 1 of 70 frames shown (2 of 24)]
[frame 11/70]
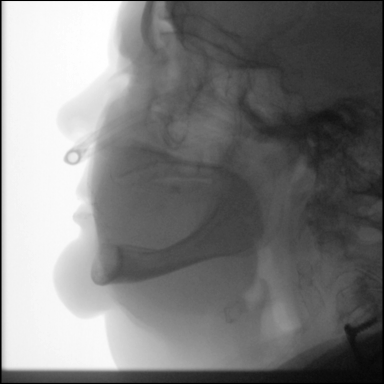

[Series 8: cp_standard · 0.34mm/px · 1 of 47 frames shown (3 of 24)]
[frame 4/47]
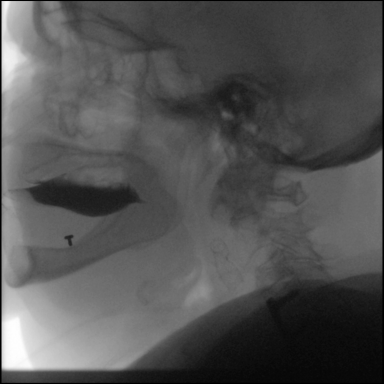

[Series 8: cp_standard · 0.34mm/px · 1 of 47 frames shown (4 of 24)]
[frame 4/47]
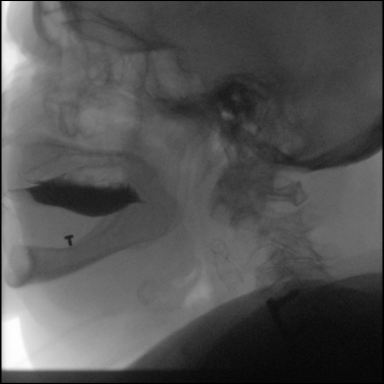

[Series 9: cp_standard · 0.34mm/px · 1 of 45 frames shown (5 of 24)]
[frame 22/45]
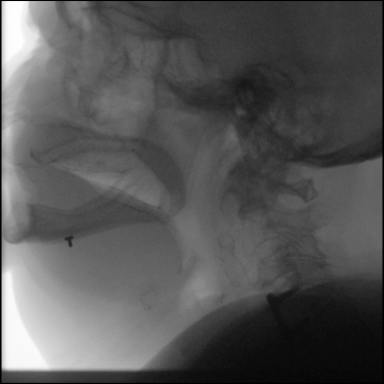

[Series 9: cp_standard · 0.34mm/px · 1 of 45 frames shown (6 of 24)]
[frame 22/45]
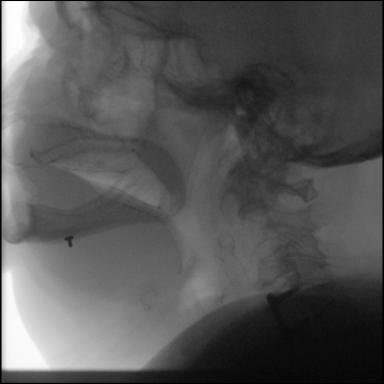

[Series 10: cp_standard · 0.34mm/px · 1 of 25 frames shown (7 of 24)]
[frame 13/25]
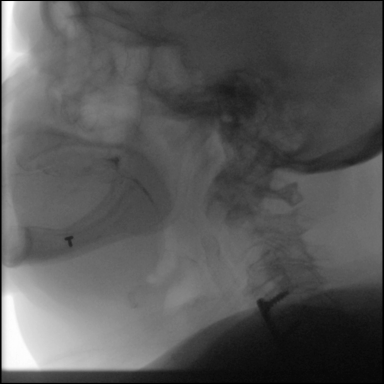

[Series 10: cp_standard · 0.34mm/px · 1 of 25 frames shown (8 of 24)]
[frame 13/25]
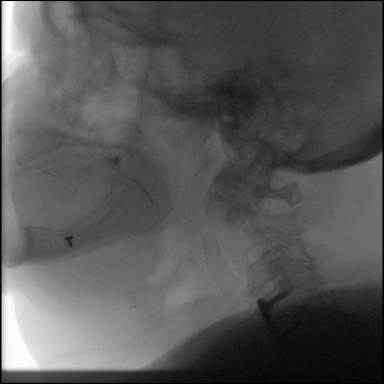

[Series 11: cp_standard · 0.34mm/px · 1 of 134 frames shown (9 of 24)]
[frame 55/134]
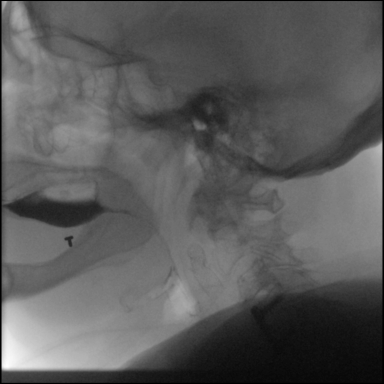

[Series 11: cp_standard · 0.34mm/px · 1 of 134 frames shown (10 of 24)]
[frame 55/134]
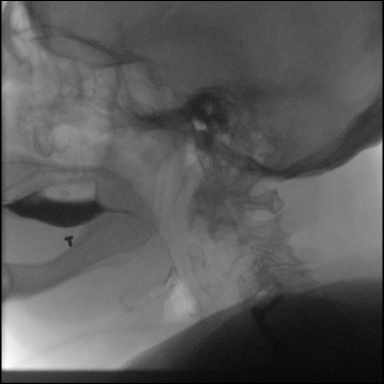

[Series 12: cp_standard · 0.34mm/px · 1 of 91 frames shown (11 of 24)]
[frame 24/91]
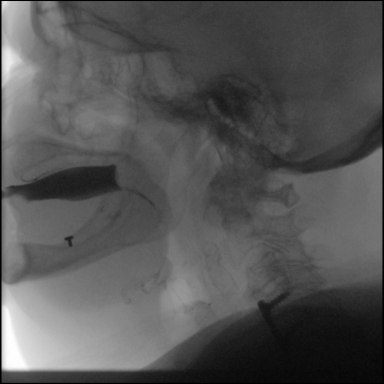

[Series 12: cp_standard · 0.34mm/px · 1 of 91 frames shown (12 of 24)]
[frame 24/91]
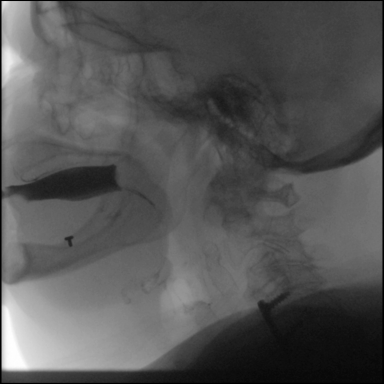

[Series 13: cp_standard · 0.34mm/px · 1 of 67 frames shown (13 of 24)]
[frame 57/67]
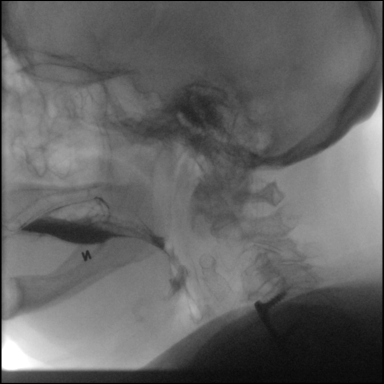

[Series 13: cp_standard · 0.34mm/px · 1 of 67 frames shown (14 of 24)]
[frame 57/67]
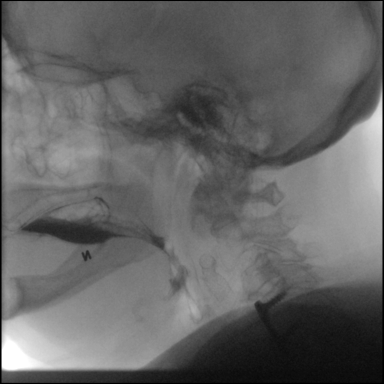

[Series 14: cp_standard · 0.34mm/px · 1 of 25 frames shown (15 of 24)]
[frame 17/25]
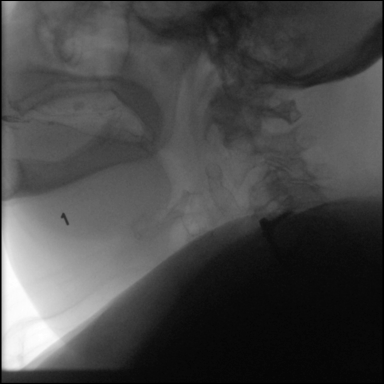

[Series 14: cp_standard · 0.34mm/px · 1 of 25 frames shown (16 of 24)]
[frame 17/25]
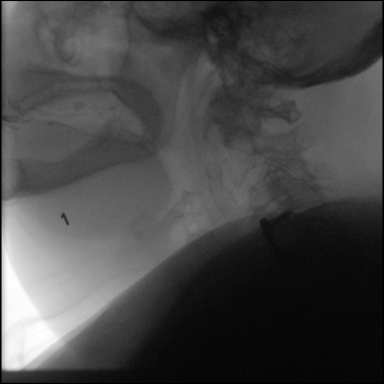

[Series 15: cp_standard · 0.34mm/px · 1 of 30 frames shown (17 of 24)]
[frame 16/30]
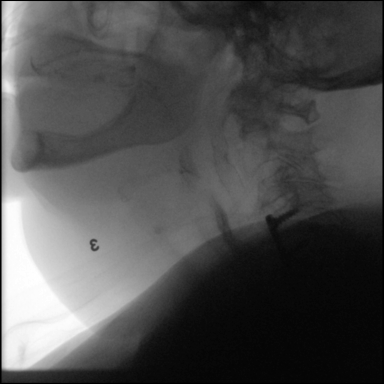

[Series 15: cp_standard · 0.34mm/px · 1 of 30 frames shown (18 of 24)]
[frame 16/30]
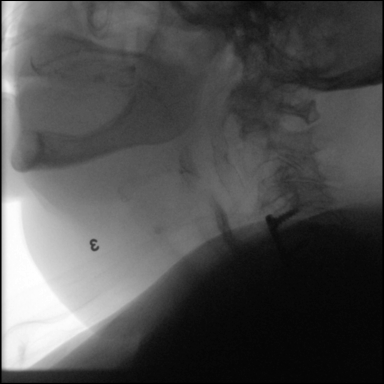

[Series 16: cp_standard · 0.34mm/px · 1 of 60 frames shown (19 of 24)]
[frame 32/60]
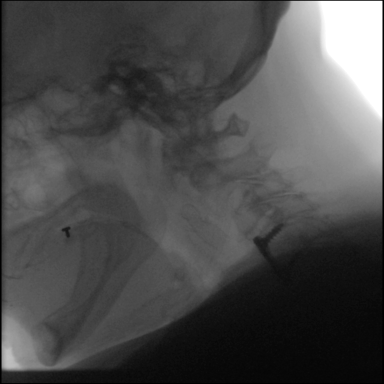

[Series 16: cp_standard · 0.34mm/px · 1 of 60 frames shown (20 of 24)]
[frame 32/60]
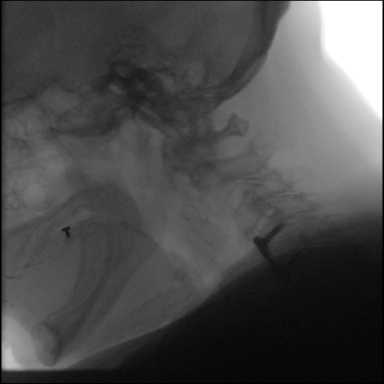

[Series 17: cp_standard · 0.34mm/px · 1 of 43 frames shown (21 of 24)]
[frame 37/43]
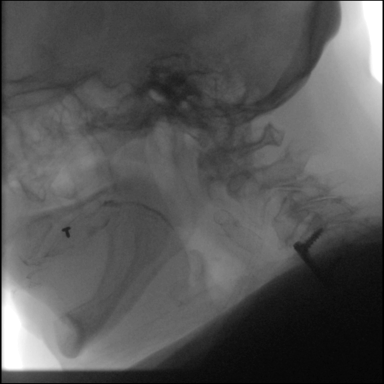

[Series 17: cp_standard · 0.34mm/px · 1 of 43 frames shown (22 of 24)]
[frame 37/43]
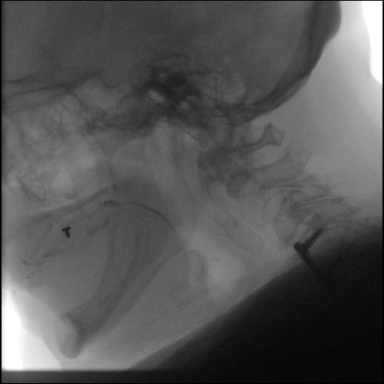

[Series 18: cp_standard · 0.34mm/px · 1 of 28 frames shown (23 of 24)]
[frame 26/28]
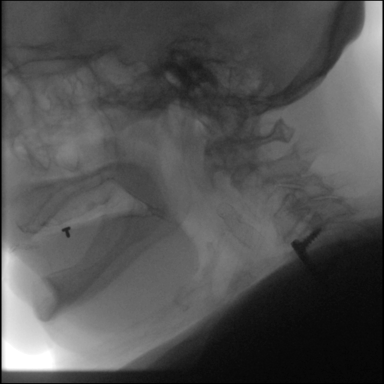

[Series 18: cp_standard · 0.34mm/px · 1 of 28 frames shown (24 of 24)]
[frame 26/28]
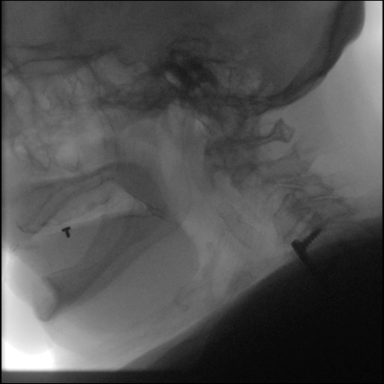

[24 of 24 positions shown; findings below may reference images not displayed]

FINDINGS: Mild laryngeal penetration with thin liquid. Penetration to the
vocal cords.
IMPRESSION: Mild laryngeal penetration with thin liquid. See pathology speech
report

Please refer to the Speech Pathologists report for complete details
and recommendations.

## 2021-12-02 DIAGNOSIS — G4733 Obstructive sleep apnea (adult) (pediatric): Secondary | ICD-10-CM | POA: Diagnosis not present

## 2021-12-02 DIAGNOSIS — D696 Thrombocytopenia, unspecified: Secondary | ICD-10-CM | POA: Diagnosis not present

## 2021-12-02 DIAGNOSIS — J45909 Unspecified asthma, uncomplicated: Secondary | ICD-10-CM | POA: Diagnosis not present

## 2021-12-02 DIAGNOSIS — I442 Atrioventricular block, complete: Secondary | ICD-10-CM | POA: Diagnosis not present

## 2021-12-02 DIAGNOSIS — E7800 Pure hypercholesterolemia, unspecified: Secondary | ICD-10-CM | POA: Diagnosis not present

## 2021-12-02 DIAGNOSIS — K589 Irritable bowel syndrome without diarrhea: Secondary | ICD-10-CM | POA: Diagnosis not present

## 2021-12-02 DIAGNOSIS — I959 Hypotension, unspecified: Secondary | ICD-10-CM | POA: Diagnosis not present

## 2021-12-02 DIAGNOSIS — J9611 Chronic respiratory failure with hypoxia: Secondary | ICD-10-CM | POA: Diagnosis not present

## 2021-12-02 DIAGNOSIS — I480 Paroxysmal atrial fibrillation: Secondary | ICD-10-CM | POA: Diagnosis not present

## 2021-12-02 DIAGNOSIS — N186 End stage renal disease: Secondary | ICD-10-CM | POA: Diagnosis not present

## 2021-12-02 DIAGNOSIS — E1122 Type 2 diabetes mellitus with diabetic chronic kidney disease: Secondary | ICD-10-CM | POA: Diagnosis not present

## 2021-12-02 DIAGNOSIS — G8929 Other chronic pain: Secondary | ICD-10-CM | POA: Diagnosis not present

## 2021-12-02 DIAGNOSIS — I428 Other cardiomyopathies: Secondary | ICD-10-CM | POA: Diagnosis not present

## 2021-12-02 DIAGNOSIS — M109 Gout, unspecified: Secondary | ICD-10-CM | POA: Diagnosis not present

## 2021-12-02 DIAGNOSIS — M1990 Unspecified osteoarthritis, unspecified site: Secondary | ICD-10-CM | POA: Diagnosis not present

## 2021-12-02 DIAGNOSIS — I5042 Chronic combined systolic (congestive) and diastolic (congestive) heart failure: Secondary | ICD-10-CM | POA: Diagnosis not present

## 2021-12-02 DIAGNOSIS — L89322 Pressure ulcer of left buttock, stage 2: Secondary | ICD-10-CM | POA: Diagnosis not present

## 2021-12-02 DIAGNOSIS — S22079D Unspecified fracture of T9-T10 vertebra, subsequent encounter for fracture with routine healing: Secondary | ICD-10-CM | POA: Diagnosis not present

## 2021-12-02 DIAGNOSIS — K219 Gastro-esophageal reflux disease without esophagitis: Secondary | ICD-10-CM | POA: Diagnosis not present

## 2021-12-02 DIAGNOSIS — D519 Vitamin B12 deficiency anemia, unspecified: Secondary | ICD-10-CM | POA: Diagnosis not present

## 2021-12-02 DIAGNOSIS — I132 Hypertensive heart and chronic kidney disease with heart failure and with stage 5 chronic kidney disease, or end stage renal disease: Secondary | ICD-10-CM | POA: Diagnosis not present

## 2021-12-02 DIAGNOSIS — I2510 Atherosclerotic heart disease of native coronary artery without angina pectoris: Secondary | ICD-10-CM | POA: Diagnosis not present

## 2021-12-05 ENCOUNTER — Other Ambulatory Visit: Payer: Self-pay | Admitting: Cardiovascular Disease

## 2021-12-05 DIAGNOSIS — I480 Paroxysmal atrial fibrillation: Secondary | ICD-10-CM | POA: Diagnosis not present

## 2021-12-05 DIAGNOSIS — E1122 Type 2 diabetes mellitus with diabetic chronic kidney disease: Secondary | ICD-10-CM | POA: Diagnosis not present

## 2021-12-05 DIAGNOSIS — I5042 Chronic combined systolic (congestive) and diastolic (congestive) heart failure: Secondary | ICD-10-CM | POA: Diagnosis not present

## 2021-12-05 DIAGNOSIS — K219 Gastro-esophageal reflux disease without esophagitis: Secondary | ICD-10-CM | POA: Diagnosis not present

## 2021-12-05 DIAGNOSIS — E7800 Pure hypercholesterolemia, unspecified: Secondary | ICD-10-CM | POA: Diagnosis not present

## 2021-12-05 DIAGNOSIS — D696 Thrombocytopenia, unspecified: Secondary | ICD-10-CM | POA: Diagnosis not present

## 2021-12-05 DIAGNOSIS — N186 End stage renal disease: Secondary | ICD-10-CM | POA: Diagnosis not present

## 2021-12-05 DIAGNOSIS — M1990 Unspecified osteoarthritis, unspecified site: Secondary | ICD-10-CM | POA: Diagnosis not present

## 2021-12-05 DIAGNOSIS — I428 Other cardiomyopathies: Secondary | ICD-10-CM | POA: Diagnosis not present

## 2021-12-05 DIAGNOSIS — L89322 Pressure ulcer of left buttock, stage 2: Secondary | ICD-10-CM | POA: Diagnosis not present

## 2021-12-05 DIAGNOSIS — I442 Atrioventricular block, complete: Secondary | ICD-10-CM | POA: Diagnosis not present

## 2021-12-05 DIAGNOSIS — J9611 Chronic respiratory failure with hypoxia: Secondary | ICD-10-CM | POA: Diagnosis not present

## 2021-12-05 DIAGNOSIS — I132 Hypertensive heart and chronic kidney disease with heart failure and with stage 5 chronic kidney disease, or end stage renal disease: Secondary | ICD-10-CM | POA: Diagnosis not present

## 2021-12-05 DIAGNOSIS — G8929 Other chronic pain: Secondary | ICD-10-CM | POA: Diagnosis not present

## 2021-12-05 DIAGNOSIS — M109 Gout, unspecified: Secondary | ICD-10-CM | POA: Diagnosis not present

## 2021-12-05 DIAGNOSIS — I2510 Atherosclerotic heart disease of native coronary artery without angina pectoris: Secondary | ICD-10-CM | POA: Diagnosis not present

## 2021-12-05 DIAGNOSIS — S22079D Unspecified fracture of T9-T10 vertebra, subsequent encounter for fracture with routine healing: Secondary | ICD-10-CM | POA: Diagnosis not present

## 2021-12-05 DIAGNOSIS — G4733 Obstructive sleep apnea (adult) (pediatric): Secondary | ICD-10-CM | POA: Diagnosis not present

## 2021-12-05 DIAGNOSIS — J45909 Unspecified asthma, uncomplicated: Secondary | ICD-10-CM | POA: Diagnosis not present

## 2021-12-05 DIAGNOSIS — D519 Vitamin B12 deficiency anemia, unspecified: Secondary | ICD-10-CM | POA: Diagnosis not present

## 2021-12-05 DIAGNOSIS — I959 Hypotension, unspecified: Secondary | ICD-10-CM | POA: Diagnosis not present

## 2021-12-05 DIAGNOSIS — K589 Irritable bowel syndrome without diarrhea: Secondary | ICD-10-CM | POA: Diagnosis not present

## 2021-12-06 DIAGNOSIS — G8929 Other chronic pain: Secondary | ICD-10-CM | POA: Diagnosis not present

## 2021-12-06 DIAGNOSIS — L89322 Pressure ulcer of left buttock, stage 2: Secondary | ICD-10-CM | POA: Diagnosis not present

## 2021-12-06 DIAGNOSIS — I5022 Chronic systolic (congestive) heart failure: Secondary | ICD-10-CM | POA: Diagnosis not present

## 2021-12-06 DIAGNOSIS — D519 Vitamin B12 deficiency anemia, unspecified: Secondary | ICD-10-CM | POA: Diagnosis not present

## 2021-12-06 DIAGNOSIS — E7800 Pure hypercholesterolemia, unspecified: Secondary | ICD-10-CM | POA: Diagnosis not present

## 2021-12-06 DIAGNOSIS — J9611 Chronic respiratory failure with hypoxia: Secondary | ICD-10-CM | POA: Diagnosis not present

## 2021-12-06 DIAGNOSIS — J9621 Acute and chronic respiratory failure with hypoxia: Secondary | ICD-10-CM | POA: Diagnosis not present

## 2021-12-06 DIAGNOSIS — I428 Other cardiomyopathies: Secondary | ICD-10-CM | POA: Diagnosis not present

## 2021-12-06 DIAGNOSIS — S22079D Unspecified fracture of T9-T10 vertebra, subsequent encounter for fracture with routine healing: Secondary | ICD-10-CM | POA: Diagnosis not present

## 2021-12-06 DIAGNOSIS — J45909 Unspecified asthma, uncomplicated: Secondary | ICD-10-CM | POA: Diagnosis not present

## 2021-12-06 DIAGNOSIS — I480 Paroxysmal atrial fibrillation: Secondary | ICD-10-CM | POA: Diagnosis not present

## 2021-12-06 DIAGNOSIS — N186 End stage renal disease: Secondary | ICD-10-CM | POA: Diagnosis not present

## 2021-12-06 DIAGNOSIS — I442 Atrioventricular block, complete: Secondary | ICD-10-CM | POA: Diagnosis not present

## 2021-12-06 DIAGNOSIS — M6281 Muscle weakness (generalized): Secondary | ICD-10-CM | POA: Diagnosis not present

## 2021-12-06 DIAGNOSIS — M1990 Unspecified osteoarthritis, unspecified site: Secondary | ICD-10-CM | POA: Diagnosis not present

## 2021-12-06 DIAGNOSIS — I132 Hypertensive heart and chronic kidney disease with heart failure and with stage 5 chronic kidney disease, or end stage renal disease: Secondary | ICD-10-CM | POA: Diagnosis not present

## 2021-12-06 DIAGNOSIS — K219 Gastro-esophageal reflux disease without esophagitis: Secondary | ICD-10-CM | POA: Diagnosis not present

## 2021-12-06 DIAGNOSIS — N1832 Chronic kidney disease, stage 3b: Secondary | ICD-10-CM | POA: Diagnosis not present

## 2021-12-06 DIAGNOSIS — E1122 Type 2 diabetes mellitus with diabetic chronic kidney disease: Secondary | ICD-10-CM | POA: Diagnosis not present

## 2021-12-06 DIAGNOSIS — I5042 Chronic combined systolic (congestive) and diastolic (congestive) heart failure: Secondary | ICD-10-CM | POA: Diagnosis not present

## 2021-12-06 DIAGNOSIS — I959 Hypotension, unspecified: Secondary | ICD-10-CM | POA: Diagnosis not present

## 2021-12-06 DIAGNOSIS — K589 Irritable bowel syndrome without diarrhea: Secondary | ICD-10-CM | POA: Diagnosis not present

## 2021-12-06 DIAGNOSIS — M109 Gout, unspecified: Secondary | ICD-10-CM | POA: Diagnosis not present

## 2021-12-06 DIAGNOSIS — D696 Thrombocytopenia, unspecified: Secondary | ICD-10-CM | POA: Diagnosis not present

## 2021-12-06 DIAGNOSIS — I2510 Atherosclerotic heart disease of native coronary artery without angina pectoris: Secondary | ICD-10-CM | POA: Diagnosis not present

## 2021-12-06 DIAGNOSIS — M19072 Primary osteoarthritis, left ankle and foot: Secondary | ICD-10-CM | POA: Diagnosis not present

## 2021-12-06 DIAGNOSIS — G4733 Obstructive sleep apnea (adult) (pediatric): Secondary | ICD-10-CM | POA: Diagnosis not present

## 2021-12-06 DIAGNOSIS — J9612 Chronic respiratory failure with hypercapnia: Secondary | ICD-10-CM | POA: Diagnosis not present

## 2021-12-10 DIAGNOSIS — L89322 Pressure ulcer of left buttock, stage 2: Secondary | ICD-10-CM | POA: Diagnosis not present

## 2021-12-10 DIAGNOSIS — D696 Thrombocytopenia, unspecified: Secondary | ICD-10-CM | POA: Diagnosis not present

## 2021-12-10 DIAGNOSIS — D519 Vitamin B12 deficiency anemia, unspecified: Secondary | ICD-10-CM | POA: Diagnosis not present

## 2021-12-10 DIAGNOSIS — I442 Atrioventricular block, complete: Secondary | ICD-10-CM | POA: Diagnosis not present

## 2021-12-10 DIAGNOSIS — J45909 Unspecified asthma, uncomplicated: Secondary | ICD-10-CM | POA: Diagnosis not present

## 2021-12-10 DIAGNOSIS — J9611 Chronic respiratory failure with hypoxia: Secondary | ICD-10-CM | POA: Diagnosis not present

## 2021-12-10 DIAGNOSIS — I132 Hypertensive heart and chronic kidney disease with heart failure and with stage 5 chronic kidney disease, or end stage renal disease: Secondary | ICD-10-CM | POA: Diagnosis not present

## 2021-12-10 DIAGNOSIS — S22079D Unspecified fracture of T9-T10 vertebra, subsequent encounter for fracture with routine healing: Secondary | ICD-10-CM | POA: Diagnosis not present

## 2021-12-10 DIAGNOSIS — K219 Gastro-esophageal reflux disease without esophagitis: Secondary | ICD-10-CM | POA: Diagnosis not present

## 2021-12-10 DIAGNOSIS — E7800 Pure hypercholesterolemia, unspecified: Secondary | ICD-10-CM | POA: Diagnosis not present

## 2021-12-10 DIAGNOSIS — E1122 Type 2 diabetes mellitus with diabetic chronic kidney disease: Secondary | ICD-10-CM | POA: Diagnosis not present

## 2021-12-10 DIAGNOSIS — M1990 Unspecified osteoarthritis, unspecified site: Secondary | ICD-10-CM | POA: Diagnosis not present

## 2021-12-10 DIAGNOSIS — I480 Paroxysmal atrial fibrillation: Secondary | ICD-10-CM | POA: Diagnosis not present

## 2021-12-10 DIAGNOSIS — I5042 Chronic combined systolic (congestive) and diastolic (congestive) heart failure: Secondary | ICD-10-CM | POA: Diagnosis not present

## 2021-12-10 DIAGNOSIS — K589 Irritable bowel syndrome without diarrhea: Secondary | ICD-10-CM | POA: Diagnosis not present

## 2021-12-10 DIAGNOSIS — M109 Gout, unspecified: Secondary | ICD-10-CM | POA: Diagnosis not present

## 2021-12-10 DIAGNOSIS — I959 Hypotension, unspecified: Secondary | ICD-10-CM | POA: Diagnosis not present

## 2021-12-10 DIAGNOSIS — G8929 Other chronic pain: Secondary | ICD-10-CM | POA: Diagnosis not present

## 2021-12-10 DIAGNOSIS — N186 End stage renal disease: Secondary | ICD-10-CM | POA: Diagnosis not present

## 2021-12-10 DIAGNOSIS — G4733 Obstructive sleep apnea (adult) (pediatric): Secondary | ICD-10-CM | POA: Diagnosis not present

## 2021-12-10 DIAGNOSIS — I428 Other cardiomyopathies: Secondary | ICD-10-CM | POA: Diagnosis not present

## 2021-12-10 DIAGNOSIS — I2510 Atherosclerotic heart disease of native coronary artery without angina pectoris: Secondary | ICD-10-CM | POA: Diagnosis not present

## 2021-12-14 ENCOUNTER — Other Ambulatory Visit: Payer: Self-pay | Admitting: Physician Assistant

## 2021-12-16 DIAGNOSIS — K589 Irritable bowel syndrome without diarrhea: Secondary | ICD-10-CM | POA: Diagnosis not present

## 2021-12-16 DIAGNOSIS — I442 Atrioventricular block, complete: Secondary | ICD-10-CM | POA: Diagnosis not present

## 2021-12-16 DIAGNOSIS — I132 Hypertensive heart and chronic kidney disease with heart failure and with stage 5 chronic kidney disease, or end stage renal disease: Secondary | ICD-10-CM | POA: Diagnosis not present

## 2021-12-16 DIAGNOSIS — K219 Gastro-esophageal reflux disease without esophagitis: Secondary | ICD-10-CM | POA: Diagnosis not present

## 2021-12-16 DIAGNOSIS — I5042 Chronic combined systolic (congestive) and diastolic (congestive) heart failure: Secondary | ICD-10-CM | POA: Diagnosis not present

## 2021-12-16 DIAGNOSIS — G8929 Other chronic pain: Secondary | ICD-10-CM | POA: Diagnosis not present

## 2021-12-16 DIAGNOSIS — E7800 Pure hypercholesterolemia, unspecified: Secondary | ICD-10-CM | POA: Diagnosis not present

## 2021-12-16 DIAGNOSIS — E1151 Type 2 diabetes mellitus with diabetic peripheral angiopathy without gangrene: Secondary | ICD-10-CM | POA: Diagnosis not present

## 2021-12-16 DIAGNOSIS — D519 Vitamin B12 deficiency anemia, unspecified: Secondary | ICD-10-CM | POA: Diagnosis not present

## 2021-12-16 DIAGNOSIS — M1990 Unspecified osteoarthritis, unspecified site: Secondary | ICD-10-CM | POA: Diagnosis not present

## 2021-12-16 DIAGNOSIS — M1000 Idiopathic gout, unspecified site: Secondary | ICD-10-CM | POA: Diagnosis not present

## 2021-12-16 DIAGNOSIS — N186 End stage renal disease: Secondary | ICD-10-CM | POA: Diagnosis not present

## 2021-12-16 DIAGNOSIS — G4733 Obstructive sleep apnea (adult) (pediatric): Secondary | ICD-10-CM | POA: Diagnosis not present

## 2021-12-16 DIAGNOSIS — I872 Venous insufficiency (chronic) (peripheral): Secondary | ICD-10-CM | POA: Diagnosis not present

## 2021-12-16 DIAGNOSIS — L97811 Non-pressure chronic ulcer of other part of right lower leg limited to breakdown of skin: Secondary | ICD-10-CM | POA: Diagnosis not present

## 2021-12-16 DIAGNOSIS — E1122 Type 2 diabetes mellitus with diabetic chronic kidney disease: Secondary | ICD-10-CM | POA: Diagnosis not present

## 2021-12-16 DIAGNOSIS — J9611 Chronic respiratory failure with hypoxia: Secondary | ICD-10-CM | POA: Diagnosis not present

## 2021-12-16 DIAGNOSIS — J4520 Mild intermittent asthma, uncomplicated: Secondary | ICD-10-CM | POA: Diagnosis not present

## 2021-12-16 DIAGNOSIS — Z7982 Long term (current) use of aspirin: Secondary | ICD-10-CM | POA: Diagnosis not present

## 2021-12-16 DIAGNOSIS — I2510 Atherosclerotic heart disease of native coronary artery without angina pectoris: Secondary | ICD-10-CM | POA: Diagnosis not present

## 2021-12-16 DIAGNOSIS — I428 Other cardiomyopathies: Secondary | ICD-10-CM | POA: Diagnosis not present

## 2021-12-16 DIAGNOSIS — I480 Paroxysmal atrial fibrillation: Secondary | ICD-10-CM | POA: Diagnosis not present

## 2021-12-16 DIAGNOSIS — I959 Hypotension, unspecified: Secondary | ICD-10-CM | POA: Diagnosis not present

## 2021-12-18 DIAGNOSIS — I132 Hypertensive heart and chronic kidney disease with heart failure and with stage 5 chronic kidney disease, or end stage renal disease: Secondary | ICD-10-CM | POA: Diagnosis not present

## 2021-12-18 DIAGNOSIS — I428 Other cardiomyopathies: Secondary | ICD-10-CM | POA: Diagnosis not present

## 2021-12-18 DIAGNOSIS — I442 Atrioventricular block, complete: Secondary | ICD-10-CM | POA: Diagnosis not present

## 2021-12-18 DIAGNOSIS — Z7982 Long term (current) use of aspirin: Secondary | ICD-10-CM | POA: Diagnosis not present

## 2021-12-18 DIAGNOSIS — N186 End stage renal disease: Secondary | ICD-10-CM | POA: Diagnosis not present

## 2021-12-18 DIAGNOSIS — J4520 Mild intermittent asthma, uncomplicated: Secondary | ICD-10-CM | POA: Diagnosis not present

## 2021-12-18 DIAGNOSIS — J9611 Chronic respiratory failure with hypoxia: Secondary | ICD-10-CM | POA: Diagnosis not present

## 2021-12-18 DIAGNOSIS — D519 Vitamin B12 deficiency anemia, unspecified: Secondary | ICD-10-CM | POA: Diagnosis not present

## 2021-12-18 DIAGNOSIS — I959 Hypotension, unspecified: Secondary | ICD-10-CM | POA: Diagnosis not present

## 2021-12-18 DIAGNOSIS — I480 Paroxysmal atrial fibrillation: Secondary | ICD-10-CM | POA: Diagnosis not present

## 2021-12-18 DIAGNOSIS — I5042 Chronic combined systolic (congestive) and diastolic (congestive) heart failure: Secondary | ICD-10-CM | POA: Diagnosis not present

## 2021-12-18 DIAGNOSIS — E7800 Pure hypercholesterolemia, unspecified: Secondary | ICD-10-CM | POA: Diagnosis not present

## 2021-12-18 DIAGNOSIS — M1000 Idiopathic gout, unspecified site: Secondary | ICD-10-CM | POA: Diagnosis not present

## 2021-12-18 DIAGNOSIS — K219 Gastro-esophageal reflux disease without esophagitis: Secondary | ICD-10-CM | POA: Diagnosis not present

## 2021-12-18 DIAGNOSIS — L97811 Non-pressure chronic ulcer of other part of right lower leg limited to breakdown of skin: Secondary | ICD-10-CM | POA: Diagnosis not present

## 2021-12-18 DIAGNOSIS — I872 Venous insufficiency (chronic) (peripheral): Secondary | ICD-10-CM | POA: Diagnosis not present

## 2021-12-18 DIAGNOSIS — K589 Irritable bowel syndrome without diarrhea: Secondary | ICD-10-CM | POA: Diagnosis not present

## 2021-12-18 DIAGNOSIS — G8929 Other chronic pain: Secondary | ICD-10-CM | POA: Diagnosis not present

## 2021-12-18 DIAGNOSIS — M1990 Unspecified osteoarthritis, unspecified site: Secondary | ICD-10-CM | POA: Diagnosis not present

## 2021-12-18 DIAGNOSIS — G4733 Obstructive sleep apnea (adult) (pediatric): Secondary | ICD-10-CM | POA: Diagnosis not present

## 2021-12-18 DIAGNOSIS — I2510 Atherosclerotic heart disease of native coronary artery without angina pectoris: Secondary | ICD-10-CM | POA: Diagnosis not present

## 2021-12-18 DIAGNOSIS — E1151 Type 2 diabetes mellitus with diabetic peripheral angiopathy without gangrene: Secondary | ICD-10-CM | POA: Diagnosis not present

## 2021-12-18 DIAGNOSIS — E1122 Type 2 diabetes mellitus with diabetic chronic kidney disease: Secondary | ICD-10-CM | POA: Diagnosis not present

## 2021-12-19 DIAGNOSIS — I480 Paroxysmal atrial fibrillation: Secondary | ICD-10-CM | POA: Diagnosis not present

## 2021-12-19 DIAGNOSIS — I872 Venous insufficiency (chronic) (peripheral): Secondary | ICD-10-CM | POA: Diagnosis not present

## 2021-12-19 DIAGNOSIS — G8929 Other chronic pain: Secondary | ICD-10-CM | POA: Diagnosis not present

## 2021-12-19 DIAGNOSIS — J4520 Mild intermittent asthma, uncomplicated: Secondary | ICD-10-CM | POA: Diagnosis not present

## 2021-12-19 DIAGNOSIS — I129 Hypertensive chronic kidney disease with stage 1 through stage 4 chronic kidney disease, or unspecified chronic kidney disease: Secondary | ICD-10-CM | POA: Diagnosis not present

## 2021-12-19 DIAGNOSIS — L97811 Non-pressure chronic ulcer of other part of right lower leg limited to breakdown of skin: Secondary | ICD-10-CM | POA: Diagnosis not present

## 2021-12-19 DIAGNOSIS — D519 Vitamin B12 deficiency anemia, unspecified: Secondary | ICD-10-CM | POA: Diagnosis not present

## 2021-12-19 DIAGNOSIS — N2581 Secondary hyperparathyroidism of renal origin: Secondary | ICD-10-CM | POA: Diagnosis not present

## 2021-12-19 DIAGNOSIS — N184 Chronic kidney disease, stage 4 (severe): Secondary | ICD-10-CM | POA: Diagnosis not present

## 2021-12-19 DIAGNOSIS — I509 Heart failure, unspecified: Secondary | ICD-10-CM | POA: Diagnosis not present

## 2021-12-19 DIAGNOSIS — I5042 Chronic combined systolic (congestive) and diastolic (congestive) heart failure: Secondary | ICD-10-CM | POA: Diagnosis not present

## 2021-12-19 DIAGNOSIS — N189 Chronic kidney disease, unspecified: Secondary | ICD-10-CM | POA: Diagnosis not present

## 2021-12-19 DIAGNOSIS — I2510 Atherosclerotic heart disease of native coronary artery without angina pectoris: Secondary | ICD-10-CM | POA: Diagnosis not present

## 2021-12-19 DIAGNOSIS — D631 Anemia in chronic kidney disease: Secondary | ICD-10-CM | POA: Diagnosis not present

## 2021-12-19 DIAGNOSIS — N186 End stage renal disease: Secondary | ICD-10-CM | POA: Diagnosis not present

## 2021-12-19 DIAGNOSIS — E1122 Type 2 diabetes mellitus with diabetic chronic kidney disease: Secondary | ICD-10-CM | POA: Diagnosis not present

## 2021-12-19 DIAGNOSIS — E1151 Type 2 diabetes mellitus with diabetic peripheral angiopathy without gangrene: Secondary | ICD-10-CM | POA: Diagnosis not present

## 2021-12-19 DIAGNOSIS — I132 Hypertensive heart and chronic kidney disease with heart failure and with stage 5 chronic kidney disease, or end stage renal disease: Secondary | ICD-10-CM | POA: Diagnosis not present

## 2021-12-23 DIAGNOSIS — I428 Other cardiomyopathies: Secondary | ICD-10-CM | POA: Diagnosis not present

## 2021-12-23 DIAGNOSIS — J9611 Chronic respiratory failure with hypoxia: Secondary | ICD-10-CM | POA: Diagnosis not present

## 2021-12-23 DIAGNOSIS — E7800 Pure hypercholesterolemia, unspecified: Secondary | ICD-10-CM | POA: Diagnosis not present

## 2021-12-23 DIAGNOSIS — I2510 Atherosclerotic heart disease of native coronary artery without angina pectoris: Secondary | ICD-10-CM | POA: Diagnosis not present

## 2021-12-23 DIAGNOSIS — J4520 Mild intermittent asthma, uncomplicated: Secondary | ICD-10-CM | POA: Diagnosis not present

## 2021-12-23 DIAGNOSIS — K589 Irritable bowel syndrome without diarrhea: Secondary | ICD-10-CM | POA: Diagnosis not present

## 2021-12-23 DIAGNOSIS — M1000 Idiopathic gout, unspecified site: Secondary | ICD-10-CM | POA: Diagnosis not present

## 2021-12-23 DIAGNOSIS — I442 Atrioventricular block, complete: Secondary | ICD-10-CM | POA: Diagnosis not present

## 2021-12-23 DIAGNOSIS — E1151 Type 2 diabetes mellitus with diabetic peripheral angiopathy without gangrene: Secondary | ICD-10-CM | POA: Diagnosis not present

## 2021-12-23 DIAGNOSIS — I132 Hypertensive heart and chronic kidney disease with heart failure and with stage 5 chronic kidney disease, or end stage renal disease: Secondary | ICD-10-CM | POA: Diagnosis not present

## 2021-12-23 DIAGNOSIS — I959 Hypotension, unspecified: Secondary | ICD-10-CM | POA: Diagnosis not present

## 2021-12-23 DIAGNOSIS — I5042 Chronic combined systolic (congestive) and diastolic (congestive) heart failure: Secondary | ICD-10-CM | POA: Diagnosis not present

## 2021-12-23 DIAGNOSIS — Z7982 Long term (current) use of aspirin: Secondary | ICD-10-CM | POA: Diagnosis not present

## 2021-12-23 DIAGNOSIS — M1990 Unspecified osteoarthritis, unspecified site: Secondary | ICD-10-CM | POA: Diagnosis not present

## 2021-12-23 DIAGNOSIS — G4733 Obstructive sleep apnea (adult) (pediatric): Secondary | ICD-10-CM | POA: Diagnosis not present

## 2021-12-23 DIAGNOSIS — G8929 Other chronic pain: Secondary | ICD-10-CM | POA: Diagnosis not present

## 2021-12-23 DIAGNOSIS — L97811 Non-pressure chronic ulcer of other part of right lower leg limited to breakdown of skin: Secondary | ICD-10-CM | POA: Diagnosis not present

## 2021-12-23 DIAGNOSIS — K219 Gastro-esophageal reflux disease without esophagitis: Secondary | ICD-10-CM | POA: Diagnosis not present

## 2021-12-23 DIAGNOSIS — D519 Vitamin B12 deficiency anemia, unspecified: Secondary | ICD-10-CM | POA: Diagnosis not present

## 2021-12-23 DIAGNOSIS — E1122 Type 2 diabetes mellitus with diabetic chronic kidney disease: Secondary | ICD-10-CM | POA: Diagnosis not present

## 2021-12-23 DIAGNOSIS — I872 Venous insufficiency (chronic) (peripheral): Secondary | ICD-10-CM | POA: Diagnosis not present

## 2021-12-23 DIAGNOSIS — I480 Paroxysmal atrial fibrillation: Secondary | ICD-10-CM | POA: Diagnosis not present

## 2021-12-23 DIAGNOSIS — N186 End stage renal disease: Secondary | ICD-10-CM | POA: Diagnosis not present

## 2021-12-25 DIAGNOSIS — I480 Paroxysmal atrial fibrillation: Secondary | ICD-10-CM | POA: Diagnosis not present

## 2021-12-25 DIAGNOSIS — M1000 Idiopathic gout, unspecified site: Secondary | ICD-10-CM | POA: Diagnosis not present

## 2021-12-25 DIAGNOSIS — I959 Hypotension, unspecified: Secondary | ICD-10-CM | POA: Diagnosis not present

## 2021-12-25 DIAGNOSIS — L97811 Non-pressure chronic ulcer of other part of right lower leg limited to breakdown of skin: Secondary | ICD-10-CM | POA: Diagnosis not present

## 2021-12-25 DIAGNOSIS — I2510 Atherosclerotic heart disease of native coronary artery without angina pectoris: Secondary | ICD-10-CM | POA: Diagnosis not present

## 2021-12-25 DIAGNOSIS — Z7982 Long term (current) use of aspirin: Secondary | ICD-10-CM | POA: Diagnosis not present

## 2021-12-25 DIAGNOSIS — K589 Irritable bowel syndrome without diarrhea: Secondary | ICD-10-CM | POA: Diagnosis not present

## 2021-12-25 DIAGNOSIS — E1151 Type 2 diabetes mellitus with diabetic peripheral angiopathy without gangrene: Secondary | ICD-10-CM | POA: Diagnosis not present

## 2021-12-25 DIAGNOSIS — G4733 Obstructive sleep apnea (adult) (pediatric): Secondary | ICD-10-CM | POA: Diagnosis not present

## 2021-12-25 DIAGNOSIS — E1122 Type 2 diabetes mellitus with diabetic chronic kidney disease: Secondary | ICD-10-CM | POA: Diagnosis not present

## 2021-12-25 DIAGNOSIS — I428 Other cardiomyopathies: Secondary | ICD-10-CM | POA: Diagnosis not present

## 2021-12-25 DIAGNOSIS — I5042 Chronic combined systolic (congestive) and diastolic (congestive) heart failure: Secondary | ICD-10-CM | POA: Diagnosis not present

## 2021-12-25 DIAGNOSIS — J4520 Mild intermittent asthma, uncomplicated: Secondary | ICD-10-CM | POA: Diagnosis not present

## 2021-12-25 DIAGNOSIS — I442 Atrioventricular block, complete: Secondary | ICD-10-CM | POA: Diagnosis not present

## 2021-12-25 DIAGNOSIS — D519 Vitamin B12 deficiency anemia, unspecified: Secondary | ICD-10-CM | POA: Diagnosis not present

## 2021-12-25 DIAGNOSIS — K219 Gastro-esophageal reflux disease without esophagitis: Secondary | ICD-10-CM | POA: Diagnosis not present

## 2021-12-25 DIAGNOSIS — M1990 Unspecified osteoarthritis, unspecified site: Secondary | ICD-10-CM | POA: Diagnosis not present

## 2021-12-25 DIAGNOSIS — I872 Venous insufficiency (chronic) (peripheral): Secondary | ICD-10-CM | POA: Diagnosis not present

## 2021-12-25 DIAGNOSIS — G8929 Other chronic pain: Secondary | ICD-10-CM | POA: Diagnosis not present

## 2021-12-25 DIAGNOSIS — E7800 Pure hypercholesterolemia, unspecified: Secondary | ICD-10-CM | POA: Diagnosis not present

## 2021-12-25 DIAGNOSIS — J9611 Chronic respiratory failure with hypoxia: Secondary | ICD-10-CM | POA: Diagnosis not present

## 2021-12-25 DIAGNOSIS — I132 Hypertensive heart and chronic kidney disease with heart failure and with stage 5 chronic kidney disease, or end stage renal disease: Secondary | ICD-10-CM | POA: Diagnosis not present

## 2021-12-25 DIAGNOSIS — N186 End stage renal disease: Secondary | ICD-10-CM | POA: Diagnosis not present

## 2021-12-27 ENCOUNTER — Other Ambulatory Visit (HOSPITAL_COMMUNITY): Payer: Self-pay | Admitting: *Deleted

## 2021-12-30 ENCOUNTER — Inpatient Hospital Stay (HOSPITAL_COMMUNITY): Admission: RE | Admit: 2021-12-30 | Payer: Medicare Other | Source: Ambulatory Visit

## 2021-12-31 ENCOUNTER — Ambulatory Visit (INDEPENDENT_AMBULATORY_CARE_PROVIDER_SITE_OTHER): Payer: Medicare Other

## 2021-12-31 DIAGNOSIS — G4733 Obstructive sleep apnea (adult) (pediatric): Secondary | ICD-10-CM | POA: Diagnosis not present

## 2021-12-31 DIAGNOSIS — K219 Gastro-esophageal reflux disease without esophagitis: Secondary | ICD-10-CM | POA: Diagnosis not present

## 2021-12-31 DIAGNOSIS — J4520 Mild intermittent asthma, uncomplicated: Secondary | ICD-10-CM | POA: Diagnosis not present

## 2021-12-31 DIAGNOSIS — I872 Venous insufficiency (chronic) (peripheral): Secondary | ICD-10-CM | POA: Diagnosis not present

## 2021-12-31 DIAGNOSIS — I442 Atrioventricular block, complete: Secondary | ICD-10-CM | POA: Diagnosis not present

## 2021-12-31 DIAGNOSIS — D519 Vitamin B12 deficiency anemia, unspecified: Secondary | ICD-10-CM | POA: Diagnosis not present

## 2021-12-31 DIAGNOSIS — E7800 Pure hypercholesterolemia, unspecified: Secondary | ICD-10-CM | POA: Diagnosis not present

## 2021-12-31 DIAGNOSIS — Z7982 Long term (current) use of aspirin: Secondary | ICD-10-CM | POA: Diagnosis not present

## 2021-12-31 DIAGNOSIS — E1151 Type 2 diabetes mellitus with diabetic peripheral angiopathy without gangrene: Secondary | ICD-10-CM | POA: Diagnosis not present

## 2021-12-31 DIAGNOSIS — I959 Hypotension, unspecified: Secondary | ICD-10-CM | POA: Diagnosis not present

## 2021-12-31 DIAGNOSIS — I480 Paroxysmal atrial fibrillation: Secondary | ICD-10-CM | POA: Diagnosis not present

## 2021-12-31 DIAGNOSIS — I132 Hypertensive heart and chronic kidney disease with heart failure and with stage 5 chronic kidney disease, or end stage renal disease: Secondary | ICD-10-CM | POA: Diagnosis not present

## 2021-12-31 DIAGNOSIS — K589 Irritable bowel syndrome without diarrhea: Secondary | ICD-10-CM | POA: Diagnosis not present

## 2021-12-31 DIAGNOSIS — N186 End stage renal disease: Secondary | ICD-10-CM | POA: Diagnosis not present

## 2021-12-31 DIAGNOSIS — M1000 Idiopathic gout, unspecified site: Secondary | ICD-10-CM | POA: Diagnosis not present

## 2021-12-31 DIAGNOSIS — I5042 Chronic combined systolic (congestive) and diastolic (congestive) heart failure: Secondary | ICD-10-CM | POA: Diagnosis not present

## 2021-12-31 DIAGNOSIS — G8929 Other chronic pain: Secondary | ICD-10-CM | POA: Diagnosis not present

## 2021-12-31 DIAGNOSIS — L97811 Non-pressure chronic ulcer of other part of right lower leg limited to breakdown of skin: Secondary | ICD-10-CM | POA: Diagnosis not present

## 2021-12-31 DIAGNOSIS — M1990 Unspecified osteoarthritis, unspecified site: Secondary | ICD-10-CM | POA: Diagnosis not present

## 2021-12-31 DIAGNOSIS — J9611 Chronic respiratory failure with hypoxia: Secondary | ICD-10-CM | POA: Diagnosis not present

## 2021-12-31 DIAGNOSIS — E1122 Type 2 diabetes mellitus with diabetic chronic kidney disease: Secondary | ICD-10-CM | POA: Diagnosis not present

## 2021-12-31 DIAGNOSIS — I2510 Atherosclerotic heart disease of native coronary artery without angina pectoris: Secondary | ICD-10-CM | POA: Diagnosis not present

## 2021-12-31 DIAGNOSIS — I428 Other cardiomyopathies: Secondary | ICD-10-CM | POA: Diagnosis not present

## 2021-12-31 LAB — CUP PACEART REMOTE DEVICE CHECK
Battery Remaining Longevity: 96 mo
Battery Remaining Percentage: 100 %
Brady Statistic RA Percent Paced: 0 %
Brady Statistic RV Percent Paced: 99 %
Date Time Interrogation Session: 20231121011100
Implantable Lead Connection Status: 753985
Implantable Lead Connection Status: 753985
Implantable Lead Connection Status: 753985
Implantable Lead Implant Date: 20080923
Implantable Lead Implant Date: 20080923
Implantable Lead Implant Date: 20080923
Implantable Lead Location: 753858
Implantable Lead Location: 753859
Implantable Lead Location: 753860
Implantable Lead Model: 4285
Implantable Lead Model: 4555
Implantable Lead Model: 7120
Implantable Lead Serial Number: 170220
Implantable Lead Serial Number: 486468
Implantable Pulse Generator Implant Date: 20221121
Lead Channel Impedance Value: 1045 Ohm
Lead Channel Impedance Value: 669 Ohm
Lead Channel Impedance Value: 994 Ohm
Lead Channel Pacing Threshold Amplitude: 0.5 V
Lead Channel Pacing Threshold Amplitude: 2 V
Lead Channel Pacing Threshold Pulse Width: 0.4 ms
Lead Channel Pacing Threshold Pulse Width: 1 ms
Lead Channel Setting Pacing Amplitude: 2 V
Lead Channel Setting Pacing Amplitude: 3.5 V
Lead Channel Setting Pacing Pulse Width: 0.4 ms
Lead Channel Setting Pacing Pulse Width: 1 ms
Lead Channel Setting Sensing Sensitivity: 3 mV
Lead Channel Setting Sensing Sensitivity: 3 mV
Pulse Gen Serial Number: 720887
Zone Setting Status: 755011

## 2022-01-01 DIAGNOSIS — M1000 Idiopathic gout, unspecified site: Secondary | ICD-10-CM | POA: Diagnosis not present

## 2022-01-01 DIAGNOSIS — J4520 Mild intermittent asthma, uncomplicated: Secondary | ICD-10-CM | POA: Diagnosis not present

## 2022-01-01 DIAGNOSIS — I872 Venous insufficiency (chronic) (peripheral): Secondary | ICD-10-CM | POA: Diagnosis not present

## 2022-01-01 DIAGNOSIS — I480 Paroxysmal atrial fibrillation: Secondary | ICD-10-CM | POA: Diagnosis not present

## 2022-01-01 DIAGNOSIS — I5042 Chronic combined systolic (congestive) and diastolic (congestive) heart failure: Secondary | ICD-10-CM | POA: Diagnosis not present

## 2022-01-01 DIAGNOSIS — I442 Atrioventricular block, complete: Secondary | ICD-10-CM | POA: Diagnosis not present

## 2022-01-01 DIAGNOSIS — L97811 Non-pressure chronic ulcer of other part of right lower leg limited to breakdown of skin: Secondary | ICD-10-CM | POA: Diagnosis not present

## 2022-01-01 DIAGNOSIS — E1151 Type 2 diabetes mellitus with diabetic peripheral angiopathy without gangrene: Secondary | ICD-10-CM | POA: Diagnosis not present

## 2022-01-01 DIAGNOSIS — G8929 Other chronic pain: Secondary | ICD-10-CM | POA: Diagnosis not present

## 2022-01-01 DIAGNOSIS — E1122 Type 2 diabetes mellitus with diabetic chronic kidney disease: Secondary | ICD-10-CM | POA: Diagnosis not present

## 2022-01-01 DIAGNOSIS — I959 Hypotension, unspecified: Secondary | ICD-10-CM | POA: Diagnosis not present

## 2022-01-01 DIAGNOSIS — I132 Hypertensive heart and chronic kidney disease with heart failure and with stage 5 chronic kidney disease, or end stage renal disease: Secondary | ICD-10-CM | POA: Diagnosis not present

## 2022-01-01 DIAGNOSIS — I428 Other cardiomyopathies: Secondary | ICD-10-CM | POA: Diagnosis not present

## 2022-01-01 DIAGNOSIS — I2510 Atherosclerotic heart disease of native coronary artery without angina pectoris: Secondary | ICD-10-CM | POA: Diagnosis not present

## 2022-01-01 DIAGNOSIS — Z7982 Long term (current) use of aspirin: Secondary | ICD-10-CM | POA: Diagnosis not present

## 2022-01-01 DIAGNOSIS — N186 End stage renal disease: Secondary | ICD-10-CM | POA: Diagnosis not present

## 2022-01-01 DIAGNOSIS — K589 Irritable bowel syndrome without diarrhea: Secondary | ICD-10-CM | POA: Diagnosis not present

## 2022-01-01 DIAGNOSIS — D519 Vitamin B12 deficiency anemia, unspecified: Secondary | ICD-10-CM | POA: Diagnosis not present

## 2022-01-01 DIAGNOSIS — J9611 Chronic respiratory failure with hypoxia: Secondary | ICD-10-CM | POA: Diagnosis not present

## 2022-01-01 DIAGNOSIS — G4733 Obstructive sleep apnea (adult) (pediatric): Secondary | ICD-10-CM | POA: Diagnosis not present

## 2022-01-01 DIAGNOSIS — K219 Gastro-esophageal reflux disease without esophagitis: Secondary | ICD-10-CM | POA: Diagnosis not present

## 2022-01-01 DIAGNOSIS — E7800 Pure hypercholesterolemia, unspecified: Secondary | ICD-10-CM | POA: Diagnosis not present

## 2022-01-01 DIAGNOSIS — M1990 Unspecified osteoarthritis, unspecified site: Secondary | ICD-10-CM | POA: Diagnosis not present

## 2022-01-03 IMAGING — DX DG CHEST 1V PORT
1 series · 1 of 1 positions shown · non-contrast
Comparison: 12/09/2020

CLINICAL DATA: Chest pain

EXAM:
PORTABLE CHEST 1 VIEW

[chest ap]
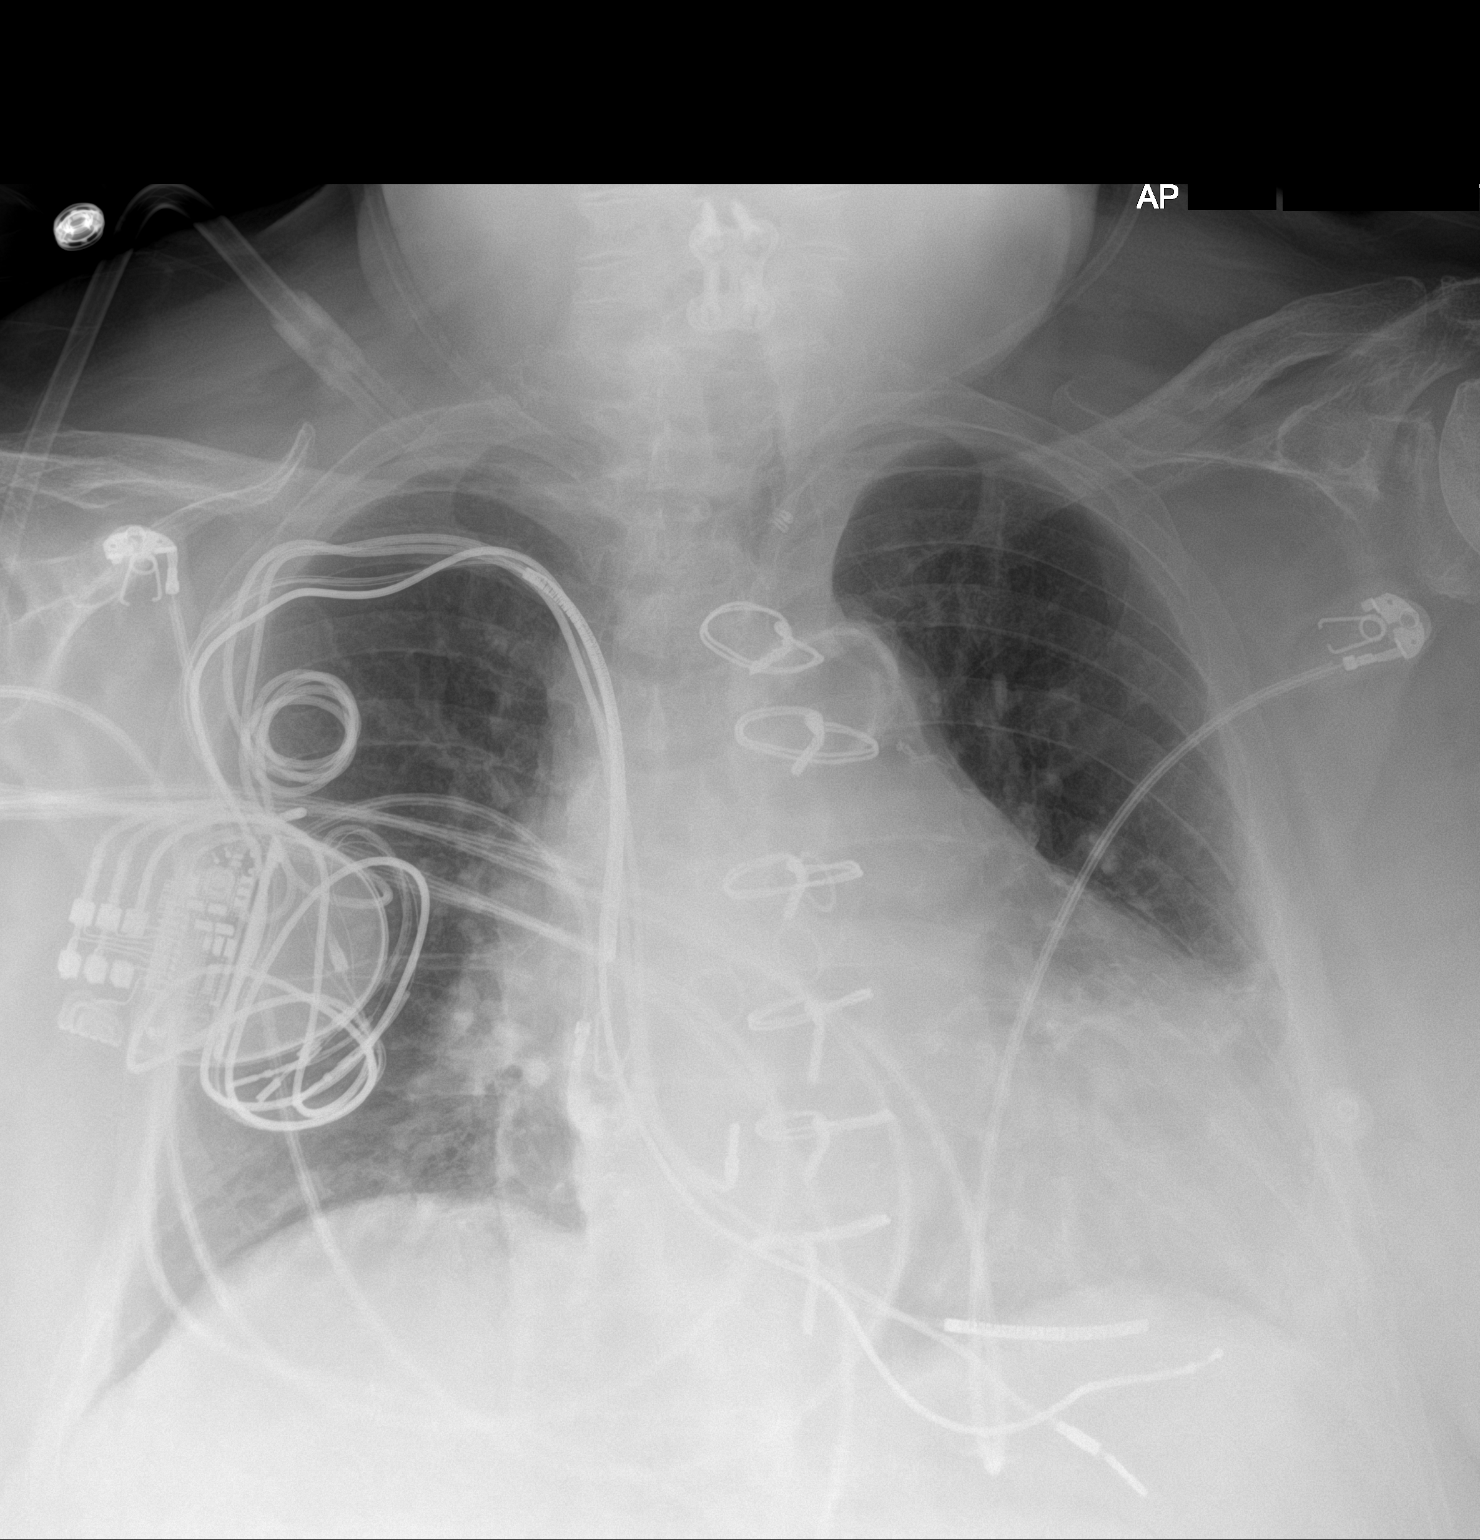

[1 of 1 positions shown; findings below may reference images not displayed]

FINDINGS: 5372 hours. The cardio pericardial silhouette is enlarged. There is
pulmonary vascular congestion without overt pulmonary edema. Status
post CABG. Right-sided permanent pacemaker/AICD again noted. Stable
scarring left base. Bones are diffusely demineralized.
IMPRESSION: Cardiomegaly with pulmonary vascular congestion.

## 2022-01-06 DIAGNOSIS — J9621 Acute and chronic respiratory failure with hypoxia: Secondary | ICD-10-CM | POA: Diagnosis not present

## 2022-01-06 DIAGNOSIS — M19072 Primary osteoarthritis, left ankle and foot: Secondary | ICD-10-CM | POA: Diagnosis not present

## 2022-01-06 DIAGNOSIS — D519 Vitamin B12 deficiency anemia, unspecified: Secondary | ICD-10-CM | POA: Diagnosis not present

## 2022-01-06 DIAGNOSIS — J4520 Mild intermittent asthma, uncomplicated: Secondary | ICD-10-CM | POA: Diagnosis not present

## 2022-01-06 DIAGNOSIS — G8929 Other chronic pain: Secondary | ICD-10-CM | POA: Diagnosis not present

## 2022-01-06 DIAGNOSIS — M1990 Unspecified osteoarthritis, unspecified site: Secondary | ICD-10-CM | POA: Diagnosis not present

## 2022-01-06 DIAGNOSIS — I959 Hypotension, unspecified: Secondary | ICD-10-CM | POA: Diagnosis not present

## 2022-01-06 DIAGNOSIS — I480 Paroxysmal atrial fibrillation: Secondary | ICD-10-CM | POA: Diagnosis not present

## 2022-01-06 DIAGNOSIS — K219 Gastro-esophageal reflux disease without esophagitis: Secondary | ICD-10-CM | POA: Diagnosis not present

## 2022-01-06 DIAGNOSIS — I5022 Chronic systolic (congestive) heart failure: Secondary | ICD-10-CM | POA: Diagnosis not present

## 2022-01-06 DIAGNOSIS — J9611 Chronic respiratory failure with hypoxia: Secondary | ICD-10-CM | POA: Diagnosis not present

## 2022-01-06 DIAGNOSIS — Z7982 Long term (current) use of aspirin: Secondary | ICD-10-CM | POA: Diagnosis not present

## 2022-01-06 DIAGNOSIS — M6281 Muscle weakness (generalized): Secondary | ICD-10-CM | POA: Diagnosis not present

## 2022-01-06 DIAGNOSIS — I5042 Chronic combined systolic (congestive) and diastolic (congestive) heart failure: Secondary | ICD-10-CM | POA: Diagnosis not present

## 2022-01-06 DIAGNOSIS — E1122 Type 2 diabetes mellitus with diabetic chronic kidney disease: Secondary | ICD-10-CM | POA: Diagnosis not present

## 2022-01-06 DIAGNOSIS — E7800 Pure hypercholesterolemia, unspecified: Secondary | ICD-10-CM | POA: Diagnosis not present

## 2022-01-06 DIAGNOSIS — N186 End stage renal disease: Secondary | ICD-10-CM | POA: Diagnosis not present

## 2022-01-06 DIAGNOSIS — J9612 Chronic respiratory failure with hypercapnia: Secondary | ICD-10-CM | POA: Diagnosis not present

## 2022-01-06 DIAGNOSIS — M1000 Idiopathic gout, unspecified site: Secondary | ICD-10-CM | POA: Diagnosis not present

## 2022-01-06 DIAGNOSIS — L97811 Non-pressure chronic ulcer of other part of right lower leg limited to breakdown of skin: Secondary | ICD-10-CM | POA: Diagnosis not present

## 2022-01-06 DIAGNOSIS — I872 Venous insufficiency (chronic) (peripheral): Secondary | ICD-10-CM | POA: Diagnosis not present

## 2022-01-06 DIAGNOSIS — G4733 Obstructive sleep apnea (adult) (pediatric): Secondary | ICD-10-CM | POA: Diagnosis not present

## 2022-01-06 DIAGNOSIS — I442 Atrioventricular block, complete: Secondary | ICD-10-CM | POA: Diagnosis not present

## 2022-01-06 DIAGNOSIS — I132 Hypertensive heart and chronic kidney disease with heart failure and with stage 5 chronic kidney disease, or end stage renal disease: Secondary | ICD-10-CM | POA: Diagnosis not present

## 2022-01-06 DIAGNOSIS — N1832 Chronic kidney disease, stage 3b: Secondary | ICD-10-CM | POA: Diagnosis not present

## 2022-01-06 DIAGNOSIS — I2510 Atherosclerotic heart disease of native coronary artery without angina pectoris: Secondary | ICD-10-CM | POA: Diagnosis not present

## 2022-01-06 DIAGNOSIS — I428 Other cardiomyopathies: Secondary | ICD-10-CM | POA: Diagnosis not present

## 2022-01-06 DIAGNOSIS — E1151 Type 2 diabetes mellitus with diabetic peripheral angiopathy without gangrene: Secondary | ICD-10-CM | POA: Diagnosis not present

## 2022-01-06 DIAGNOSIS — K589 Irritable bowel syndrome without diarrhea: Secondary | ICD-10-CM | POA: Diagnosis not present

## 2022-01-07 ENCOUNTER — Encounter (HOSPITAL_COMMUNITY): Payer: Medicare Other

## 2022-01-07 ENCOUNTER — Encounter (HOSPITAL_COMMUNITY): Payer: Self-pay

## 2022-01-08 ENCOUNTER — Telehealth (HOSPITAL_COMMUNITY): Payer: Self-pay

## 2022-01-08 DIAGNOSIS — I442 Atrioventricular block, complete: Secondary | ICD-10-CM | POA: Diagnosis not present

## 2022-01-08 DIAGNOSIS — I2510 Atherosclerotic heart disease of native coronary artery without angina pectoris: Secondary | ICD-10-CM | POA: Diagnosis not present

## 2022-01-08 DIAGNOSIS — K589 Irritable bowel syndrome without diarrhea: Secondary | ICD-10-CM | POA: Diagnosis not present

## 2022-01-08 DIAGNOSIS — L97811 Non-pressure chronic ulcer of other part of right lower leg limited to breakdown of skin: Secondary | ICD-10-CM | POA: Diagnosis not present

## 2022-01-08 DIAGNOSIS — Z7982 Long term (current) use of aspirin: Secondary | ICD-10-CM | POA: Diagnosis not present

## 2022-01-08 DIAGNOSIS — J4520 Mild intermittent asthma, uncomplicated: Secondary | ICD-10-CM | POA: Diagnosis not present

## 2022-01-08 DIAGNOSIS — I872 Venous insufficiency (chronic) (peripheral): Secondary | ICD-10-CM | POA: Diagnosis not present

## 2022-01-08 DIAGNOSIS — M1990 Unspecified osteoarthritis, unspecified site: Secondary | ICD-10-CM | POA: Diagnosis not present

## 2022-01-08 DIAGNOSIS — K219 Gastro-esophageal reflux disease without esophagitis: Secondary | ICD-10-CM | POA: Diagnosis not present

## 2022-01-08 DIAGNOSIS — G8929 Other chronic pain: Secondary | ICD-10-CM | POA: Diagnosis not present

## 2022-01-08 DIAGNOSIS — I132 Hypertensive heart and chronic kidney disease with heart failure and with stage 5 chronic kidney disease, or end stage renal disease: Secondary | ICD-10-CM | POA: Diagnosis not present

## 2022-01-08 DIAGNOSIS — I428 Other cardiomyopathies: Secondary | ICD-10-CM | POA: Diagnosis not present

## 2022-01-08 DIAGNOSIS — G4733 Obstructive sleep apnea (adult) (pediatric): Secondary | ICD-10-CM | POA: Diagnosis not present

## 2022-01-08 DIAGNOSIS — D519 Vitamin B12 deficiency anemia, unspecified: Secondary | ICD-10-CM | POA: Diagnosis not present

## 2022-01-08 DIAGNOSIS — N186 End stage renal disease: Secondary | ICD-10-CM | POA: Diagnosis not present

## 2022-01-08 DIAGNOSIS — E7800 Pure hypercholesterolemia, unspecified: Secondary | ICD-10-CM | POA: Diagnosis not present

## 2022-01-08 DIAGNOSIS — J9611 Chronic respiratory failure with hypoxia: Secondary | ICD-10-CM | POA: Diagnosis not present

## 2022-01-08 DIAGNOSIS — I5042 Chronic combined systolic (congestive) and diastolic (congestive) heart failure: Secondary | ICD-10-CM | POA: Diagnosis not present

## 2022-01-08 DIAGNOSIS — I959 Hypotension, unspecified: Secondary | ICD-10-CM | POA: Diagnosis not present

## 2022-01-08 DIAGNOSIS — M1000 Idiopathic gout, unspecified site: Secondary | ICD-10-CM | POA: Diagnosis not present

## 2022-01-08 DIAGNOSIS — E1151 Type 2 diabetes mellitus with diabetic peripheral angiopathy without gangrene: Secondary | ICD-10-CM | POA: Diagnosis not present

## 2022-01-08 DIAGNOSIS — I480 Paroxysmal atrial fibrillation: Secondary | ICD-10-CM | POA: Diagnosis not present

## 2022-01-08 DIAGNOSIS — E1122 Type 2 diabetes mellitus with diabetic chronic kidney disease: Secondary | ICD-10-CM | POA: Diagnosis not present

## 2022-01-08 NOTE — Telephone Encounter (Signed)
Called to confirm/remind patient of their appointment at the [facility] on 01/09/22. However, per patient's request to cancel appointment and will call our office to get rescheduled.  Patient reminded to bring all medications and/or complete list.  Confirmed patient has transportation. Gave directions, instructed to utilize [facility] parking.  Confirmed appointment prior to ending call.

## 2022-01-09 ENCOUNTER — Encounter (HOSPITAL_COMMUNITY): Payer: Medicare Other

## 2022-01-09 DIAGNOSIS — I480 Paroxysmal atrial fibrillation: Secondary | ICD-10-CM | POA: Diagnosis not present

## 2022-01-09 DIAGNOSIS — I959 Hypotension, unspecified: Secondary | ICD-10-CM | POA: Diagnosis not present

## 2022-01-09 DIAGNOSIS — L97811 Non-pressure chronic ulcer of other part of right lower leg limited to breakdown of skin: Secondary | ICD-10-CM | POA: Diagnosis not present

## 2022-01-09 DIAGNOSIS — E1122 Type 2 diabetes mellitus with diabetic chronic kidney disease: Secondary | ICD-10-CM | POA: Diagnosis not present

## 2022-01-09 DIAGNOSIS — K589 Irritable bowel syndrome without diarrhea: Secondary | ICD-10-CM | POA: Diagnosis not present

## 2022-01-09 DIAGNOSIS — N184 Chronic kidney disease, stage 4 (severe): Secondary | ICD-10-CM | POA: Diagnosis not present

## 2022-01-09 DIAGNOSIS — I10 Essential (primary) hypertension: Secondary | ICD-10-CM | POA: Diagnosis not present

## 2022-01-09 DIAGNOSIS — K219 Gastro-esophageal reflux disease without esophagitis: Secondary | ICD-10-CM | POA: Diagnosis not present

## 2022-01-09 DIAGNOSIS — G4733 Obstructive sleep apnea (adult) (pediatric): Secondary | ICD-10-CM | POA: Diagnosis not present

## 2022-01-09 DIAGNOSIS — I428 Other cardiomyopathies: Secondary | ICD-10-CM | POA: Diagnosis not present

## 2022-01-09 DIAGNOSIS — I132 Hypertensive heart and chronic kidney disease with heart failure and with stage 5 chronic kidney disease, or end stage renal disease: Secondary | ICD-10-CM | POA: Diagnosis not present

## 2022-01-09 DIAGNOSIS — E7800 Pure hypercholesterolemia, unspecified: Secondary | ICD-10-CM | POA: Diagnosis not present

## 2022-01-09 DIAGNOSIS — M1990 Unspecified osteoarthritis, unspecified site: Secondary | ICD-10-CM | POA: Diagnosis not present

## 2022-01-09 DIAGNOSIS — I442 Atrioventricular block, complete: Secondary | ICD-10-CM | POA: Diagnosis not present

## 2022-01-09 DIAGNOSIS — D519 Vitamin B12 deficiency anemia, unspecified: Secondary | ICD-10-CM | POA: Diagnosis not present

## 2022-01-09 DIAGNOSIS — Z7982 Long term (current) use of aspirin: Secondary | ICD-10-CM | POA: Diagnosis not present

## 2022-01-09 DIAGNOSIS — I5033 Acute on chronic diastolic (congestive) heart failure: Secondary | ICD-10-CM | POA: Diagnosis not present

## 2022-01-09 DIAGNOSIS — I5042 Chronic combined systolic (congestive) and diastolic (congestive) heart failure: Secondary | ICD-10-CM | POA: Diagnosis not present

## 2022-01-09 DIAGNOSIS — I2510 Atherosclerotic heart disease of native coronary artery without angina pectoris: Secondary | ICD-10-CM | POA: Diagnosis not present

## 2022-01-09 DIAGNOSIS — E1151 Type 2 diabetes mellitus with diabetic peripheral angiopathy without gangrene: Secondary | ICD-10-CM | POA: Diagnosis not present

## 2022-01-09 DIAGNOSIS — J9611 Chronic respiratory failure with hypoxia: Secondary | ICD-10-CM | POA: Diagnosis not present

## 2022-01-09 DIAGNOSIS — J4520 Mild intermittent asthma, uncomplicated: Secondary | ICD-10-CM | POA: Diagnosis not present

## 2022-01-09 DIAGNOSIS — G8929 Other chronic pain: Secondary | ICD-10-CM | POA: Diagnosis not present

## 2022-01-09 DIAGNOSIS — E1169 Type 2 diabetes mellitus with other specified complication: Secondary | ICD-10-CM | POA: Diagnosis not present

## 2022-01-09 DIAGNOSIS — M1000 Idiopathic gout, unspecified site: Secondary | ICD-10-CM | POA: Diagnosis not present

## 2022-01-09 DIAGNOSIS — N186 End stage renal disease: Secondary | ICD-10-CM | POA: Diagnosis not present

## 2022-01-09 DIAGNOSIS — I872 Venous insufficiency (chronic) (peripheral): Secondary | ICD-10-CM | POA: Diagnosis not present

## 2022-01-10 ENCOUNTER — Other Ambulatory Visit (HOSPITAL_COMMUNITY): Payer: Self-pay

## 2022-01-10 MED ORDER — FUROSEMIDE 80 MG PO TABS: 80.0000 mg | ORAL_TABLET | Freq: Two times a day (BID) | ORAL | 3 refills | Status: DC

## 2022-01-13 ENCOUNTER — Encounter (HOSPITAL_COMMUNITY): Payer: Medicare Other

## 2022-01-13 ENCOUNTER — Encounter (HOSPITAL_COMMUNITY)
Admission: RE | Admit: 2022-01-13 | Discharge: 2022-01-13 | Disposition: A | Discharge status: Home / Self Care | Payer: Medicare Other | Source: Ambulatory Visit | Attending: Nephrology | Admitting: Nephrology

## 2022-01-13 VITALS — BP 125/66 | HR 85 | Temp 97.4°F | Resp 17 | Wt 197.0 lb

## 2022-01-13 DIAGNOSIS — D631 Anemia in chronic kidney disease: Secondary | ICD-10-CM | POA: Diagnosis not present

## 2022-01-13 DIAGNOSIS — N185 Chronic kidney disease, stage 5: Secondary | ICD-10-CM | POA: Diagnosis not present

## 2022-01-13 MED ORDER — SODIUM CHLORIDE 0.9 % IV SOLN
510.0000 mg | INTRAVENOUS | Status: DC
  Administered 2022-01-13: 510 mg via INTRAVENOUS
  Filled 2022-01-13: qty 510

## 2022-01-13 MED ORDER — EPOETIN ALFA-EPBX 10000 UNIT/ML IJ SOLN
INTRAMUSCULAR | Status: AC
  Administered 2022-01-13: 20000 [IU] via SUBCUTANEOUS
  Filled 2022-01-13: qty 2

## 2022-01-13 MED ORDER — EPOETIN ALFA-EPBX 10000 UNIT/ML IJ SOLN: 20000.0000 [IU] | INTRAMUSCULAR | Status: DC

## 2022-01-14 DIAGNOSIS — E7800 Pure hypercholesterolemia, unspecified: Secondary | ICD-10-CM | POA: Diagnosis not present

## 2022-01-14 DIAGNOSIS — G8929 Other chronic pain: Secondary | ICD-10-CM | POA: Diagnosis not present

## 2022-01-14 DIAGNOSIS — N186 End stage renal disease: Secondary | ICD-10-CM | POA: Diagnosis not present

## 2022-01-14 DIAGNOSIS — I428 Other cardiomyopathies: Secondary | ICD-10-CM | POA: Diagnosis not present

## 2022-01-14 DIAGNOSIS — M1000 Idiopathic gout, unspecified site: Secondary | ICD-10-CM | POA: Diagnosis not present

## 2022-01-14 DIAGNOSIS — K589 Irritable bowel syndrome without diarrhea: Secondary | ICD-10-CM | POA: Diagnosis not present

## 2022-01-14 DIAGNOSIS — D519 Vitamin B12 deficiency anemia, unspecified: Secondary | ICD-10-CM | POA: Diagnosis not present

## 2022-01-14 DIAGNOSIS — I132 Hypertensive heart and chronic kidney disease with heart failure and with stage 5 chronic kidney disease, or end stage renal disease: Secondary | ICD-10-CM | POA: Diagnosis not present

## 2022-01-14 DIAGNOSIS — I5042 Chronic combined systolic (congestive) and diastolic (congestive) heart failure: Secondary | ICD-10-CM | POA: Diagnosis not present

## 2022-01-14 DIAGNOSIS — E1151 Type 2 diabetes mellitus with diabetic peripheral angiopathy without gangrene: Secondary | ICD-10-CM | POA: Diagnosis not present

## 2022-01-14 DIAGNOSIS — J4520 Mild intermittent asthma, uncomplicated: Secondary | ICD-10-CM | POA: Diagnosis not present

## 2022-01-14 DIAGNOSIS — I2510 Atherosclerotic heart disease of native coronary artery without angina pectoris: Secondary | ICD-10-CM | POA: Diagnosis not present

## 2022-01-14 DIAGNOSIS — M1990 Unspecified osteoarthritis, unspecified site: Secondary | ICD-10-CM | POA: Diagnosis not present

## 2022-01-14 DIAGNOSIS — I442 Atrioventricular block, complete: Secondary | ICD-10-CM | POA: Diagnosis not present

## 2022-01-14 DIAGNOSIS — G4733 Obstructive sleep apnea (adult) (pediatric): Secondary | ICD-10-CM | POA: Diagnosis not present

## 2022-01-14 DIAGNOSIS — I959 Hypotension, unspecified: Secondary | ICD-10-CM | POA: Diagnosis not present

## 2022-01-14 DIAGNOSIS — K219 Gastro-esophageal reflux disease without esophagitis: Secondary | ICD-10-CM | POA: Diagnosis not present

## 2022-01-14 DIAGNOSIS — Z7982 Long term (current) use of aspirin: Secondary | ICD-10-CM | POA: Diagnosis not present

## 2022-01-14 DIAGNOSIS — L97811 Non-pressure chronic ulcer of other part of right lower leg limited to breakdown of skin: Secondary | ICD-10-CM | POA: Diagnosis not present

## 2022-01-14 DIAGNOSIS — J9611 Chronic respiratory failure with hypoxia: Secondary | ICD-10-CM | POA: Diagnosis not present

## 2022-01-14 DIAGNOSIS — I872 Venous insufficiency (chronic) (peripheral): Secondary | ICD-10-CM | POA: Diagnosis not present

## 2022-01-14 DIAGNOSIS — I480 Paroxysmal atrial fibrillation: Secondary | ICD-10-CM | POA: Diagnosis not present

## 2022-01-14 DIAGNOSIS — E1122 Type 2 diabetes mellitus with diabetic chronic kidney disease: Secondary | ICD-10-CM | POA: Diagnosis not present

## 2022-01-14 LAB — POCT HEMOGLOBIN-HEMACUE: Hemoglobin: 9.2 g/dL — ABNORMAL LOW (ref 12.0–15.0)

## 2022-01-20 DIAGNOSIS — L97811 Non-pressure chronic ulcer of other part of right lower leg limited to breakdown of skin: Secondary | ICD-10-CM | POA: Diagnosis not present

## 2022-01-20 DIAGNOSIS — M1000 Idiopathic gout, unspecified site: Secondary | ICD-10-CM | POA: Diagnosis not present

## 2022-01-20 DIAGNOSIS — K589 Irritable bowel syndrome without diarrhea: Secondary | ICD-10-CM | POA: Diagnosis not present

## 2022-01-20 DIAGNOSIS — I132 Hypertensive heart and chronic kidney disease with heart failure and with stage 5 chronic kidney disease, or end stage renal disease: Secondary | ICD-10-CM | POA: Diagnosis not present

## 2022-01-20 DIAGNOSIS — J9611 Chronic respiratory failure with hypoxia: Secondary | ICD-10-CM | POA: Diagnosis not present

## 2022-01-20 DIAGNOSIS — I428 Other cardiomyopathies: Secondary | ICD-10-CM | POA: Diagnosis not present

## 2022-01-20 DIAGNOSIS — I442 Atrioventricular block, complete: Secondary | ICD-10-CM | POA: Diagnosis not present

## 2022-01-20 DIAGNOSIS — I959 Hypotension, unspecified: Secondary | ICD-10-CM | POA: Diagnosis not present

## 2022-01-20 DIAGNOSIS — K219 Gastro-esophageal reflux disease without esophagitis: Secondary | ICD-10-CM | POA: Diagnosis not present

## 2022-01-20 DIAGNOSIS — I872 Venous insufficiency (chronic) (peripheral): Secondary | ICD-10-CM | POA: Diagnosis not present

## 2022-01-20 DIAGNOSIS — E1122 Type 2 diabetes mellitus with diabetic chronic kidney disease: Secondary | ICD-10-CM | POA: Diagnosis not present

## 2022-01-20 DIAGNOSIS — G8929 Other chronic pain: Secondary | ICD-10-CM | POA: Diagnosis not present

## 2022-01-20 DIAGNOSIS — E1151 Type 2 diabetes mellitus with diabetic peripheral angiopathy without gangrene: Secondary | ICD-10-CM | POA: Diagnosis not present

## 2022-01-20 DIAGNOSIS — D519 Vitamin B12 deficiency anemia, unspecified: Secondary | ICD-10-CM | POA: Diagnosis not present

## 2022-01-20 DIAGNOSIS — I2510 Atherosclerotic heart disease of native coronary artery without angina pectoris: Secondary | ICD-10-CM | POA: Diagnosis not present

## 2022-01-20 DIAGNOSIS — Z7982 Long term (current) use of aspirin: Secondary | ICD-10-CM | POA: Diagnosis not present

## 2022-01-20 DIAGNOSIS — G4733 Obstructive sleep apnea (adult) (pediatric): Secondary | ICD-10-CM | POA: Diagnosis not present

## 2022-01-20 DIAGNOSIS — I5042 Chronic combined systolic (congestive) and diastolic (congestive) heart failure: Secondary | ICD-10-CM | POA: Diagnosis not present

## 2022-01-20 DIAGNOSIS — N186 End stage renal disease: Secondary | ICD-10-CM | POA: Diagnosis not present

## 2022-01-20 DIAGNOSIS — E7800 Pure hypercholesterolemia, unspecified: Secondary | ICD-10-CM | POA: Diagnosis not present

## 2022-01-20 DIAGNOSIS — I480 Paroxysmal atrial fibrillation: Secondary | ICD-10-CM | POA: Diagnosis not present

## 2022-01-20 DIAGNOSIS — M1990 Unspecified osteoarthritis, unspecified site: Secondary | ICD-10-CM | POA: Diagnosis not present

## 2022-01-20 DIAGNOSIS — J4520 Mild intermittent asthma, uncomplicated: Secondary | ICD-10-CM | POA: Diagnosis not present

## 2022-01-21 ENCOUNTER — Encounter (HOSPITAL_COMMUNITY): Payer: Medicare Other

## 2022-01-24 DIAGNOSIS — N186 End stage renal disease: Secondary | ICD-10-CM | POA: Diagnosis not present

## 2022-01-24 DIAGNOSIS — I959 Hypotension, unspecified: Secondary | ICD-10-CM | POA: Diagnosis not present

## 2022-01-24 DIAGNOSIS — E1151 Type 2 diabetes mellitus with diabetic peripheral angiopathy without gangrene: Secondary | ICD-10-CM | POA: Diagnosis not present

## 2022-01-24 DIAGNOSIS — I5042 Chronic combined systolic (congestive) and diastolic (congestive) heart failure: Secondary | ICD-10-CM | POA: Diagnosis not present

## 2022-01-24 DIAGNOSIS — I442 Atrioventricular block, complete: Secondary | ICD-10-CM | POA: Diagnosis not present

## 2022-01-24 DIAGNOSIS — E1122 Type 2 diabetes mellitus with diabetic chronic kidney disease: Secondary | ICD-10-CM | POA: Diagnosis not present

## 2022-01-24 DIAGNOSIS — E7800 Pure hypercholesterolemia, unspecified: Secondary | ICD-10-CM | POA: Diagnosis not present

## 2022-01-24 DIAGNOSIS — M1990 Unspecified osteoarthritis, unspecified site: Secondary | ICD-10-CM | POA: Diagnosis not present

## 2022-01-24 DIAGNOSIS — K589 Irritable bowel syndrome without diarrhea: Secondary | ICD-10-CM | POA: Diagnosis not present

## 2022-01-24 DIAGNOSIS — D519 Vitamin B12 deficiency anemia, unspecified: Secondary | ICD-10-CM | POA: Diagnosis not present

## 2022-01-24 DIAGNOSIS — I428 Other cardiomyopathies: Secondary | ICD-10-CM | POA: Diagnosis not present

## 2022-01-24 DIAGNOSIS — G8929 Other chronic pain: Secondary | ICD-10-CM | POA: Diagnosis not present

## 2022-01-24 DIAGNOSIS — K219 Gastro-esophageal reflux disease without esophagitis: Secondary | ICD-10-CM | POA: Diagnosis not present

## 2022-01-24 DIAGNOSIS — I872 Venous insufficiency (chronic) (peripheral): Secondary | ICD-10-CM | POA: Diagnosis not present

## 2022-01-24 DIAGNOSIS — I2510 Atherosclerotic heart disease of native coronary artery without angina pectoris: Secondary | ICD-10-CM | POA: Diagnosis not present

## 2022-01-24 DIAGNOSIS — J4520 Mild intermittent asthma, uncomplicated: Secondary | ICD-10-CM | POA: Diagnosis not present

## 2022-01-24 DIAGNOSIS — Z7982 Long term (current) use of aspirin: Secondary | ICD-10-CM | POA: Diagnosis not present

## 2022-01-24 DIAGNOSIS — J9611 Chronic respiratory failure with hypoxia: Secondary | ICD-10-CM | POA: Diagnosis not present

## 2022-01-24 DIAGNOSIS — L97811 Non-pressure chronic ulcer of other part of right lower leg limited to breakdown of skin: Secondary | ICD-10-CM | POA: Diagnosis not present

## 2022-01-24 DIAGNOSIS — I132 Hypertensive heart and chronic kidney disease with heart failure and with stage 5 chronic kidney disease, or end stage renal disease: Secondary | ICD-10-CM | POA: Diagnosis not present

## 2022-01-24 DIAGNOSIS — M1000 Idiopathic gout, unspecified site: Secondary | ICD-10-CM | POA: Diagnosis not present

## 2022-01-24 DIAGNOSIS — I480 Paroxysmal atrial fibrillation: Secondary | ICD-10-CM | POA: Diagnosis not present

## 2022-01-24 DIAGNOSIS — G4733 Obstructive sleep apnea (adult) (pediatric): Secondary | ICD-10-CM | POA: Diagnosis not present

## 2022-01-24 NOTE — Progress Notes (Signed)
Remote pacemaker transmission.   

## 2022-01-26 ENCOUNTER — Emergency Department (HOSPITAL_COMMUNITY): Payer: Medicare Other

## 2022-01-26 ENCOUNTER — Other Ambulatory Visit: Payer: Self-pay

## 2022-01-26 ENCOUNTER — Emergency Department (HOSPITAL_COMMUNITY)
Admission: EM | Admit: 2022-01-26 | Discharge: 2022-01-26 | Disposition: A | Discharge status: Home / Self Care | Payer: Medicare Other | Attending: Emergency Medicine | Admitting: Emergency Medicine

## 2022-01-26 DIAGNOSIS — I517 Cardiomegaly: Secondary | ICD-10-CM | POA: Diagnosis not present

## 2022-01-26 DIAGNOSIS — Z7401 Bed confinement status: Secondary | ICD-10-CM | POA: Diagnosis not present

## 2022-01-26 DIAGNOSIS — Z043 Encounter for examination and observation following other accident: Secondary | ICD-10-CM | POA: Diagnosis not present

## 2022-01-26 DIAGNOSIS — S0083XA Contusion of other part of head, initial encounter: Secondary | ICD-10-CM | POA: Diagnosis not present

## 2022-01-26 DIAGNOSIS — W19XXXA Unspecified fall, initial encounter: Secondary | ICD-10-CM

## 2022-01-26 DIAGNOSIS — W1830XA Fall on same level, unspecified, initial encounter: Secondary | ICD-10-CM | POA: Diagnosis not present

## 2022-01-26 DIAGNOSIS — R519 Headache, unspecified: Secondary | ICD-10-CM | POA: Diagnosis not present

## 2022-01-26 DIAGNOSIS — Z743 Need for continuous supervision: Secondary | ICD-10-CM | POA: Diagnosis not present

## 2022-01-26 DIAGNOSIS — S0990XA Unspecified injury of head, initial encounter: Secondary | ICD-10-CM | POA: Diagnosis not present

## 2022-01-26 DIAGNOSIS — R2689 Other abnormalities of gait and mobility: Secondary | ICD-10-CM | POA: Diagnosis not present

## 2022-01-26 DIAGNOSIS — M542 Cervicalgia: Secondary | ICD-10-CM | POA: Diagnosis not present

## 2022-01-26 LAB — BASIC METABOLIC PANEL
Anion gap: 14 (ref 5–15)
BUN: 103 mg/dL — ABNORMAL HIGH (ref 8–23)
CO2: 30 mmol/L (ref 22–32)
Calcium: 9.4 mg/dL (ref 8.9–10.3)
Chloride: 97 mmol/L — ABNORMAL LOW (ref 98–111)
Creatinine, Ser: 4.62 mg/dL — ABNORMAL HIGH (ref 0.44–1.00)
GFR, Estimated: 9 mL/min — ABNORMAL LOW (ref >60)
Glucose, Bld: 149 mg/dL — ABNORMAL HIGH (ref 70–99)
Potassium: 3.2 mmol/L — ABNORMAL LOW (ref 3.5–5.1)
Sodium: 141 mmol/L (ref 135–145)

## 2022-01-26 LAB — CBC WITH DIFFERENTIAL/PLATELET
Abs Immature Granulocytes: 0.04 10*3/uL (ref 0.00–0.07)
Basophils Absolute: 0.1 10*3/uL (ref 0.0–0.1)
Basophils Relative: 1 %
Eosinophils Absolute: 0.4 10*3/uL (ref 0.0–0.5)
Eosinophils Relative: 5 %
HCT: 31.9 % — ABNORMAL LOW (ref 36.0–46.0)
Hemoglobin: 9.7 g/dL — ABNORMAL LOW (ref 12.0–15.0)
Immature Granulocytes: 1 %
Lymphocytes Relative: 12 %
Lymphs Abs: 0.9 10*3/uL (ref 0.7–4.0)
MCH: 28.6 pg (ref 26.0–34.0)
MCHC: 30.4 g/dL (ref 30.0–36.0)
MCV: 94.1 fL (ref 80.0–100.0)
Monocytes Absolute: 0.5 10*3/uL (ref 0.1–1.0)
Monocytes Relative: 6 %
Neutro Abs: 5.6 10*3/uL (ref 1.7–7.7)
Neutrophils Relative %: 75 %
Platelets: 186 10*3/uL (ref 150–400)
RBC: 3.39 MIL/uL — ABNORMAL LOW (ref 3.87–5.11)
RDW: 14.8 % (ref 11.5–15.5)
WBC: 7.5 10*3/uL (ref 4.0–10.5)
nRBC: 0 % (ref 0.0–0.2)

## 2022-01-26 NOTE — ED Notes (Signed)
Pt transported to ct by [place] RN, [place] RN

## 2022-01-26 NOTE — ED Provider Notes (Signed)
[facility] EMERGENCY DEPARTMENT Provider Note   CSN: [id number] Arrival date & time: 01/26/22  1413     History Chief Complaint  Patient presents with   Fall    On thinners    HPI [name] is a 76 y.o. female presenting for chief complaint of fall.  States that she was a ground-level fall that happened at her nursing facility.  She fell forward hitting the left side of her forehead.  She denies fevers or chills, nausea vomiting, syncope shortness of breath.  She is consistently using her Plavix every day. No known sick contacts..   Patient's recorded medical, surgical, social, medication list and allergies were reviewed in the Snapshot window as part of the initial history.   Review of Systems   Review of Systems  Constitutional:  Negative for chills and fever.  HENT:  Negative for ear pain and sore throat.   Eyes:  Negative for pain and visual disturbance.  Respiratory:  Negative for cough and shortness of breath.   Cardiovascular:  Negative for chest pain and palpitations.  Gastrointestinal:  Negative for abdominal pain and vomiting.  Genitourinary:  Negative for dysuria and hematuria.  Musculoskeletal:  Negative for arthralgias and back pain.  Skin:  Negative for color change and rash.  Neurological:  Negative for seizures and syncope.  All other systems reviewed and are negative.   Physical Exam Updated Vital Signs BP 123/70   Pulse 83   Temp (!) 97.4 F (36.3 C) (Oral)   Resp 14   SpO2 99%  Physical Exam Vitals and nursing note reviewed.  Constitutional:      General: She is not in acute distress.    Appearance: She is well-developed.  HENT:     Head: Normocephalic and atraumatic.  Eyes:     Conjunctiva/sclera: Conjunctivae normal.  Cardiovascular:     Rate and Rhythm: Normal rate and regular rhythm.     Heart sounds: No murmur heard. Pulmonary:     Effort: Pulmonary effort is normal. No respiratory distress.     Breath  sounds: Normal breath sounds.  Abdominal:     Palpations: Abdomen is soft.     Tenderness: There is no abdominal tenderness.  Musculoskeletal:        General: Signs of injury (Bruising to left anterior forehead.) present. No swelling.     Cervical back: Neck supple.  Skin:    General: Skin is warm and dry.     Capillary Refill: Capillary refill takes less than 2 seconds.  Neurological:     Mental Status: She is alert.  Psychiatric:        Mood and Affect: Mood normal.      ED Course/ Medical Decision Making/ A&P    Procedures Procedures   Medications Ordered in ED Medications - No data to display Medical Decision Making:   [name] is a 76 y.o. female who presented to the ED today with a fall at their living facility detailed above. They are  on a blood thinner. Patient's presentation is complicated by their history of multiple comorbid medical problems.  Patient placed on continuous vitals and telemetry monitoring while in ED which was reviewed periodically.   Complete initial physical exam performed, notably the patient  was hemodynamically stable  in no acute distress. No obvious deformities or injuries appreciated on extensive physical exam including active range of motion of all joints.     Reviewed and confirmed nursing documentation for past medical  history, family history, social history.    Initial Assessment/Plan:   This is a patient presenting with a moderate blunt mechanism trauma.  As such, I have considered intracranial injuries including intracranial hemorrhage, intrathoracic injuries including blunt myocardial or blunt lung injury, blunt abdominal injuries including aortic dissection, bladder injury, spleen injury, liver injury and I have considered orthopedic injuries including extremity or spinal injury. This is most consistent with an acute life/limb threatening illness complicated by underlying chronic conditions.  With the patient's presentation of  moderate mechanism trauma but an otherwise reassuring exam, patient warrants targeted evaluation for potential traumatic injuries. Will proceed with targeted evaluation for potential injuries. Will proceed with CT Head, Cervical Spine CT, and Chest/Pelvis XR.   Images reviewed and agree with radiology interpretation.  CT CERVICAL SPINE WO CONTRAST  Result Date: 01/26/2022 CLINICAL DATA:  Fall. Blunt head and neck trauma. Head and neck pain. EXAM: CT CERVICAL SPINE WITHOUT CONTRAST TECHNIQUE: Multidetector CT imaging of the cervical spine was performed without intravenous contrast. Multiplanar CT image reconstructions were also generated. RADIATION DOSE REDUCTION: This exam was performed according to the departmental dose-optimization program which includes automated exposure control, adjustment of the mA and/or kV according to patient size and/or use of iterative reconstruction technique. COMPARISON:  09/09/2021 FINDINGS: Alignment: Normal. Skull base and vertebrae: No acute fracture. No primary bone lesion or focal pathologic process. Soft tissues and spinal canal: No prevertebral fluid or swelling. No visible canal hematoma. Disc levels: Prior ACDF again seen at C4-5. Moderate to severe degenerative disc disease at C3-4 C5-6, and C6-7. Mild cervical facet DJD again seen bilaterally at multiple levels. Atlantoaxial degenerative changes again noted. Upper chest: No acute findings. Other: Right carotid stent again noted. IMPRESSION: No evidence of acute cervical spine fracture or subluxation. Degenerative spondylosis, as described above. Electronically Signed   By: Marlaine Hind M.D.   On: 01/26/2022 15:07   DG Pelvis Portable  Result Date: 01/26/2022 CLINICAL DATA:  Fall with pelvic pain. EXAM: PORTABLE PELVIS 1 VIEWS COMPARISON:  09/09/2021 FINDINGS: No acute fracture, subluxation or dislocation identified. Surgical hardware and remote fracture of the proximal LEFT femur again noted. Degenerative changes  in the LOWER lumbar spine identified. IMPRESSION: No acute bony abnormality. Electronically Signed   By: Margarette Canada M.D.   On: 01/26/2022 15:05   DG Chest Portable 1 View  Result Date: 01/26/2022 CLINICAL DATA:  Fall. EXAM: PORTABLE CHEST 1 VIEW COMPARISON:  11/01/2021 chest radiograph prior studies FINDINGS: Cardiomegaly LEFT-sided pacemaker/ICD and CABG changes again noted. There is no evidence of focal airspace disease, pulmonary edema, suspicious pulmonary nodule/mass, pleural effusion, or pneumothorax. No acute bony abnormalities are identified. Thoracic fusion hardware again noted. IMPRESSION: No active disease. Electronically Signed   By: Margarette Canada M.D.   On: 01/26/2022 15:04   CT HEAD WO CONTRAST (5MM)  Result Date: 01/26/2022 CLINICAL DATA:  Fall.  Blunt head trauma.  Headache.  On Plavix. EXAM: CT HEAD WITHOUT CONTRAST TECHNIQUE: Contiguous axial images were obtained from the base of the skull through the vertex without intravenous contrast. RADIATION DOSE REDUCTION: This exam was performed according to the departmental dose-optimization program which includes automated exposure control, adjustment of the mA and/or kV according to patient size and/or use of iterative reconstruction technique. COMPARISON:  09/09/2021 FINDINGS: Brain: No evidence of intracranial hemorrhage, acute infarction, hydrocephalus, extra-axial collection, or mass lesion/mass effect. Stable mild cerebral atrophy and chronic small vessel disease. Vascular:  No hyperdense vessel or other acute findings. Skull: No evidence of  fracture or other significant bone abnormality. Sinuses/Orbits:  No acute findings. Other: None. IMPRESSION: No acute intracranial abnormality. Stable mild cerebral atrophy and chronic small vessel disease. Electronically Signed   By: Marlaine Hind M.D.   On: 01/26/2022 15:03    Final Reassessment and Plan:   Objective evaluation no focal pathology.  Acute on chronic kidney disease appreciated.   Patient stable for outpatient care management with no acute indication for further intervention at this time.  Patient symptomatically improved on reassessment.  Disposition:  I have considered need for hospitalization, however, considering all of the above, I believe this patient is stable for discharge at this time.  Patient/family educated about specific return precautions for given chief complaint and symptoms.  Patient/family educated about follow-up with PCP.     Patient/family expressed understanding of return precautions and need for follow-up. Patient spoken to regarding all imaging and laboratory results and appropriate follow up for these results. All education provided in verbal form with additional information in written form. Time was allowed for answering of patient questions. Patient discharged.    Emergency Department Medication Summary:   Medications - No data to display         Clinical Impression:  1. Fall, initial encounter      Discharge   Final Clinical Impression(s) / ED Diagnoses Final diagnoses:  Fall, initial encounter    Rx / DC Orders ED Discharge Orders     None         Tretha Sciara, MD 01/26/22 1529

## 2022-01-26 NOTE — Progress Notes (Signed)
Orthopedic Tech Progress Note Patient Details:  [name] 04/16/1945 [id number]  Patient ID: [name], female   DOB: 12/26/1945, 77 y.o.   MRN: [id number] Level II; not currently needed. [name] 01/26/2022, 3:02 PM

## 2022-01-26 NOTE — ED Triage Notes (Signed)
From home, mechanical fall, hit the from of the head, laceration present. On Plavix, Aox4, pain on the head 3 out 10. Denies LOC. Pressure with [facility] 140/90

## 2022-01-26 NOTE — ED Notes (Signed)
Patient transported back from CT 

## 2022-01-27 ENCOUNTER — Encounter (HOSPITAL_COMMUNITY)
Admission: RE | Admit: 2022-01-27 | Discharge: 2022-01-27 | Disposition: A | Discharge status: Home / Self Care | Payer: Medicare Other | Source: Ambulatory Visit | Attending: Nephrology | Admitting: Nephrology

## 2022-01-27 VITALS — BP 106/50 | HR 81 | Temp 98.3°F | Resp 17

## 2022-01-27 DIAGNOSIS — N185 Chronic kidney disease, stage 5: Secondary | ICD-10-CM | POA: Diagnosis not present

## 2022-01-27 DIAGNOSIS — D631 Anemia in chronic kidney disease: Secondary | ICD-10-CM

## 2022-01-27 MED ORDER — SODIUM CHLORIDE 0.9 % IV SOLN
510.0000 mg | INTRAVENOUS | Status: DC
  Administered 2022-01-27: 510 mg via INTRAVENOUS
  Filled 2022-01-27: qty 510

## 2022-01-27 MED ORDER — EPOETIN ALFA-EPBX 10000 UNIT/ML IJ SOLN
INTRAMUSCULAR | Status: AC
  Filled 2022-01-27: qty 2

## 2022-01-27 MED ORDER — EPOETIN ALFA-EPBX 10000 UNIT/ML IJ SOLN
20000.0000 [IU] | INTRAMUSCULAR | Status: DC
  Administered 2022-01-27: 20000 [IU] via SUBCUTANEOUS

## 2022-01-28 DIAGNOSIS — I5042 Chronic combined systolic (congestive) and diastolic (congestive) heart failure: Secondary | ICD-10-CM | POA: Diagnosis not present

## 2022-01-28 DIAGNOSIS — G8929 Other chronic pain: Secondary | ICD-10-CM | POA: Diagnosis not present

## 2022-01-28 DIAGNOSIS — E1151 Type 2 diabetes mellitus with diabetic peripheral angiopathy without gangrene: Secondary | ICD-10-CM | POA: Diagnosis not present

## 2022-01-28 DIAGNOSIS — K589 Irritable bowel syndrome without diarrhea: Secondary | ICD-10-CM | POA: Diagnosis not present

## 2022-01-28 DIAGNOSIS — I480 Paroxysmal atrial fibrillation: Secondary | ICD-10-CM | POA: Diagnosis not present

## 2022-01-28 DIAGNOSIS — E1122 Type 2 diabetes mellitus with diabetic chronic kidney disease: Secondary | ICD-10-CM | POA: Diagnosis not present

## 2022-01-28 DIAGNOSIS — I959 Hypotension, unspecified: Secondary | ICD-10-CM | POA: Diagnosis not present

## 2022-01-28 DIAGNOSIS — M1000 Idiopathic gout, unspecified site: Secondary | ICD-10-CM | POA: Diagnosis not present

## 2022-01-28 DIAGNOSIS — J4520 Mild intermittent asthma, uncomplicated: Secondary | ICD-10-CM | POA: Diagnosis not present

## 2022-01-28 DIAGNOSIS — L97811 Non-pressure chronic ulcer of other part of right lower leg limited to breakdown of skin: Secondary | ICD-10-CM | POA: Diagnosis not present

## 2022-01-28 DIAGNOSIS — E7800 Pure hypercholesterolemia, unspecified: Secondary | ICD-10-CM | POA: Diagnosis not present

## 2022-01-28 DIAGNOSIS — K219 Gastro-esophageal reflux disease without esophagitis: Secondary | ICD-10-CM | POA: Diagnosis not present

## 2022-01-28 DIAGNOSIS — D519 Vitamin B12 deficiency anemia, unspecified: Secondary | ICD-10-CM | POA: Diagnosis not present

## 2022-01-28 DIAGNOSIS — I442 Atrioventricular block, complete: Secondary | ICD-10-CM | POA: Diagnosis not present

## 2022-01-28 DIAGNOSIS — G4733 Obstructive sleep apnea (adult) (pediatric): Secondary | ICD-10-CM | POA: Diagnosis not present

## 2022-01-28 DIAGNOSIS — I872 Venous insufficiency (chronic) (peripheral): Secondary | ICD-10-CM | POA: Diagnosis not present

## 2022-01-28 DIAGNOSIS — M1990 Unspecified osteoarthritis, unspecified site: Secondary | ICD-10-CM | POA: Diagnosis not present

## 2022-01-28 DIAGNOSIS — I132 Hypertensive heart and chronic kidney disease with heart failure and with stage 5 chronic kidney disease, or end stage renal disease: Secondary | ICD-10-CM | POA: Diagnosis not present

## 2022-01-28 DIAGNOSIS — N186 End stage renal disease: Secondary | ICD-10-CM | POA: Diagnosis not present

## 2022-01-28 DIAGNOSIS — I2510 Atherosclerotic heart disease of native coronary artery without angina pectoris: Secondary | ICD-10-CM | POA: Diagnosis not present

## 2022-01-28 DIAGNOSIS — J9611 Chronic respiratory failure with hypoxia: Secondary | ICD-10-CM | POA: Diagnosis not present

## 2022-01-28 DIAGNOSIS — I428 Other cardiomyopathies: Secondary | ICD-10-CM | POA: Diagnosis not present

## 2022-01-28 DIAGNOSIS — Z7982 Long term (current) use of aspirin: Secondary | ICD-10-CM | POA: Diagnosis not present

## 2022-01-28 LAB — POCT HEMOGLOBIN-HEMACUE: Hemoglobin: 9.2 g/dL — ABNORMAL LOW (ref 12.0–15.0)

## 2022-01-29 DIAGNOSIS — E7800 Pure hypercholesterolemia, unspecified: Secondary | ICD-10-CM | POA: Diagnosis not present

## 2022-01-29 DIAGNOSIS — D519 Vitamin B12 deficiency anemia, unspecified: Secondary | ICD-10-CM | POA: Diagnosis not present

## 2022-01-29 DIAGNOSIS — I428 Other cardiomyopathies: Secondary | ICD-10-CM | POA: Diagnosis not present

## 2022-01-29 DIAGNOSIS — E1122 Type 2 diabetes mellitus with diabetic chronic kidney disease: Secondary | ICD-10-CM | POA: Diagnosis not present

## 2022-01-29 DIAGNOSIS — I959 Hypotension, unspecified: Secondary | ICD-10-CM | POA: Diagnosis not present

## 2022-01-29 DIAGNOSIS — G8929 Other chronic pain: Secondary | ICD-10-CM | POA: Diagnosis not present

## 2022-01-29 DIAGNOSIS — I132 Hypertensive heart and chronic kidney disease with heart failure and with stage 5 chronic kidney disease, or end stage renal disease: Secondary | ICD-10-CM | POA: Diagnosis not present

## 2022-01-29 DIAGNOSIS — I2510 Atherosclerotic heart disease of native coronary artery without angina pectoris: Secondary | ICD-10-CM | POA: Diagnosis not present

## 2022-01-29 DIAGNOSIS — I480 Paroxysmal atrial fibrillation: Secondary | ICD-10-CM | POA: Diagnosis not present

## 2022-01-29 DIAGNOSIS — N186 End stage renal disease: Secondary | ICD-10-CM | POA: Diagnosis not present

## 2022-01-29 DIAGNOSIS — E1151 Type 2 diabetes mellitus with diabetic peripheral angiopathy without gangrene: Secondary | ICD-10-CM | POA: Diagnosis not present

## 2022-01-29 DIAGNOSIS — G4733 Obstructive sleep apnea (adult) (pediatric): Secondary | ICD-10-CM | POA: Diagnosis not present

## 2022-01-29 DIAGNOSIS — L97811 Non-pressure chronic ulcer of other part of right lower leg limited to breakdown of skin: Secondary | ICD-10-CM | POA: Diagnosis not present

## 2022-01-29 DIAGNOSIS — J9611 Chronic respiratory failure with hypoxia: Secondary | ICD-10-CM | POA: Diagnosis not present

## 2022-01-29 DIAGNOSIS — M1990 Unspecified osteoarthritis, unspecified site: Secondary | ICD-10-CM | POA: Diagnosis not present

## 2022-01-29 DIAGNOSIS — I442 Atrioventricular block, complete: Secondary | ICD-10-CM | POA: Diagnosis not present

## 2022-01-29 DIAGNOSIS — K219 Gastro-esophageal reflux disease without esophagitis: Secondary | ICD-10-CM | POA: Diagnosis not present

## 2022-01-29 DIAGNOSIS — Z7982 Long term (current) use of aspirin: Secondary | ICD-10-CM | POA: Diagnosis not present

## 2022-01-29 DIAGNOSIS — I5042 Chronic combined systolic (congestive) and diastolic (congestive) heart failure: Secondary | ICD-10-CM | POA: Diagnosis not present

## 2022-01-29 DIAGNOSIS — M1000 Idiopathic gout, unspecified site: Secondary | ICD-10-CM | POA: Diagnosis not present

## 2022-01-29 DIAGNOSIS — I872 Venous insufficiency (chronic) (peripheral): Secondary | ICD-10-CM | POA: Diagnosis not present

## 2022-01-29 DIAGNOSIS — J4520 Mild intermittent asthma, uncomplicated: Secondary | ICD-10-CM | POA: Diagnosis not present

## 2022-01-29 DIAGNOSIS — K589 Irritable bowel syndrome without diarrhea: Secondary | ICD-10-CM | POA: Diagnosis not present

## 2022-01-31 ENCOUNTER — Encounter (HOSPITAL_COMMUNITY): Payer: Medicare Other

## 2022-02-01 DIAGNOSIS — N186 End stage renal disease: Secondary | ICD-10-CM | POA: Diagnosis not present

## 2022-02-01 DIAGNOSIS — E7800 Pure hypercholesterolemia, unspecified: Secondary | ICD-10-CM | POA: Diagnosis not present

## 2022-02-01 DIAGNOSIS — K219 Gastro-esophageal reflux disease without esophagitis: Secondary | ICD-10-CM | POA: Diagnosis not present

## 2022-02-01 DIAGNOSIS — I5042 Chronic combined systolic (congestive) and diastolic (congestive) heart failure: Secondary | ICD-10-CM | POA: Diagnosis not present

## 2022-02-01 DIAGNOSIS — I2510 Atherosclerotic heart disease of native coronary artery without angina pectoris: Secondary | ICD-10-CM | POA: Diagnosis not present

## 2022-02-01 DIAGNOSIS — J9611 Chronic respiratory failure with hypoxia: Secondary | ICD-10-CM | POA: Diagnosis not present

## 2022-02-01 DIAGNOSIS — I959 Hypotension, unspecified: Secondary | ICD-10-CM | POA: Diagnosis not present

## 2022-02-01 DIAGNOSIS — K589 Irritable bowel syndrome without diarrhea: Secondary | ICD-10-CM | POA: Diagnosis not present

## 2022-02-01 DIAGNOSIS — M1000 Idiopathic gout, unspecified site: Secondary | ICD-10-CM | POA: Diagnosis not present

## 2022-02-01 DIAGNOSIS — Z7982 Long term (current) use of aspirin: Secondary | ICD-10-CM | POA: Diagnosis not present

## 2022-02-01 DIAGNOSIS — J4520 Mild intermittent asthma, uncomplicated: Secondary | ICD-10-CM | POA: Diagnosis not present

## 2022-02-01 DIAGNOSIS — I132 Hypertensive heart and chronic kidney disease with heart failure and with stage 5 chronic kidney disease, or end stage renal disease: Secondary | ICD-10-CM | POA: Diagnosis not present

## 2022-02-01 DIAGNOSIS — M1990 Unspecified osteoarthritis, unspecified site: Secondary | ICD-10-CM | POA: Diagnosis not present

## 2022-02-01 DIAGNOSIS — I428 Other cardiomyopathies: Secondary | ICD-10-CM | POA: Diagnosis not present

## 2022-02-01 DIAGNOSIS — G4733 Obstructive sleep apnea (adult) (pediatric): Secondary | ICD-10-CM | POA: Diagnosis not present

## 2022-02-01 DIAGNOSIS — L97811 Non-pressure chronic ulcer of other part of right lower leg limited to breakdown of skin: Secondary | ICD-10-CM | POA: Diagnosis not present

## 2022-02-01 DIAGNOSIS — G8929 Other chronic pain: Secondary | ICD-10-CM | POA: Diagnosis not present

## 2022-02-01 DIAGNOSIS — I480 Paroxysmal atrial fibrillation: Secondary | ICD-10-CM | POA: Diagnosis not present

## 2022-02-01 DIAGNOSIS — E1122 Type 2 diabetes mellitus with diabetic chronic kidney disease: Secondary | ICD-10-CM | POA: Diagnosis not present

## 2022-02-01 DIAGNOSIS — I872 Venous insufficiency (chronic) (peripheral): Secondary | ICD-10-CM | POA: Diagnosis not present

## 2022-02-01 DIAGNOSIS — D519 Vitamin B12 deficiency anemia, unspecified: Secondary | ICD-10-CM | POA: Diagnosis not present

## 2022-02-01 DIAGNOSIS — E1151 Type 2 diabetes mellitus with diabetic peripheral angiopathy without gangrene: Secondary | ICD-10-CM | POA: Diagnosis not present

## 2022-02-01 DIAGNOSIS — I442 Atrioventricular block, complete: Secondary | ICD-10-CM | POA: Diagnosis not present

## 2022-02-04 DIAGNOSIS — E1122 Type 2 diabetes mellitus with diabetic chronic kidney disease: Secondary | ICD-10-CM | POA: Diagnosis not present

## 2022-02-04 DIAGNOSIS — E1151 Type 2 diabetes mellitus with diabetic peripheral angiopathy without gangrene: Secondary | ICD-10-CM | POA: Diagnosis not present

## 2022-02-04 DIAGNOSIS — Z7982 Long term (current) use of aspirin: Secondary | ICD-10-CM | POA: Diagnosis not present

## 2022-02-04 DIAGNOSIS — I132 Hypertensive heart and chronic kidney disease with heart failure and with stage 5 chronic kidney disease, or end stage renal disease: Secondary | ICD-10-CM | POA: Diagnosis not present

## 2022-02-04 DIAGNOSIS — I2510 Atherosclerotic heart disease of native coronary artery without angina pectoris: Secondary | ICD-10-CM | POA: Diagnosis not present

## 2022-02-04 DIAGNOSIS — I5042 Chronic combined systolic (congestive) and diastolic (congestive) heart failure: Secondary | ICD-10-CM | POA: Diagnosis not present

## 2022-02-04 DIAGNOSIS — M1990 Unspecified osteoarthritis, unspecified site: Secondary | ICD-10-CM | POA: Diagnosis not present

## 2022-02-04 DIAGNOSIS — K219 Gastro-esophageal reflux disease without esophagitis: Secondary | ICD-10-CM | POA: Diagnosis not present

## 2022-02-04 DIAGNOSIS — G8929 Other chronic pain: Secondary | ICD-10-CM | POA: Diagnosis not present

## 2022-02-04 DIAGNOSIS — K589 Irritable bowel syndrome without diarrhea: Secondary | ICD-10-CM | POA: Diagnosis not present

## 2022-02-04 DIAGNOSIS — I872 Venous insufficiency (chronic) (peripheral): Secondary | ICD-10-CM | POA: Diagnosis not present

## 2022-02-04 DIAGNOSIS — L97811 Non-pressure chronic ulcer of other part of right lower leg limited to breakdown of skin: Secondary | ICD-10-CM | POA: Diagnosis not present

## 2022-02-04 DIAGNOSIS — I959 Hypotension, unspecified: Secondary | ICD-10-CM | POA: Diagnosis not present

## 2022-02-04 DIAGNOSIS — J4520 Mild intermittent asthma, uncomplicated: Secondary | ICD-10-CM | POA: Diagnosis not present

## 2022-02-04 DIAGNOSIS — I442 Atrioventricular block, complete: Secondary | ICD-10-CM | POA: Diagnosis not present

## 2022-02-04 DIAGNOSIS — J9611 Chronic respiratory failure with hypoxia: Secondary | ICD-10-CM | POA: Diagnosis not present

## 2022-02-04 DIAGNOSIS — G4733 Obstructive sleep apnea (adult) (pediatric): Secondary | ICD-10-CM | POA: Diagnosis not present

## 2022-02-04 DIAGNOSIS — N186 End stage renal disease: Secondary | ICD-10-CM | POA: Diagnosis not present

## 2022-02-04 DIAGNOSIS — M1000 Idiopathic gout, unspecified site: Secondary | ICD-10-CM | POA: Diagnosis not present

## 2022-02-04 DIAGNOSIS — I428 Other cardiomyopathies: Secondary | ICD-10-CM | POA: Diagnosis not present

## 2022-02-04 DIAGNOSIS — I480 Paroxysmal atrial fibrillation: Secondary | ICD-10-CM | POA: Diagnosis not present

## 2022-02-04 DIAGNOSIS — D519 Vitamin B12 deficiency anemia, unspecified: Secondary | ICD-10-CM | POA: Diagnosis not present

## 2022-02-04 DIAGNOSIS — E7800 Pure hypercholesterolemia, unspecified: Secondary | ICD-10-CM | POA: Diagnosis not present

## 2022-02-05 DIAGNOSIS — N1832 Chronic kidney disease, stage 3b: Secondary | ICD-10-CM | POA: Diagnosis not present

## 2022-02-05 DIAGNOSIS — I5022 Chronic systolic (congestive) heart failure: Secondary | ICD-10-CM | POA: Diagnosis not present

## 2022-02-05 DIAGNOSIS — M19072 Primary osteoarthritis, left ankle and foot: Secondary | ICD-10-CM | POA: Diagnosis not present

## 2022-02-05 DIAGNOSIS — J9611 Chronic respiratory failure with hypoxia: Secondary | ICD-10-CM | POA: Diagnosis not present

## 2022-02-05 DIAGNOSIS — J9621 Acute and chronic respiratory failure with hypoxia: Secondary | ICD-10-CM | POA: Diagnosis not present

## 2022-02-05 DIAGNOSIS — M6281 Muscle weakness (generalized): Secondary | ICD-10-CM | POA: Diagnosis not present

## 2022-02-05 DIAGNOSIS — J9612 Chronic respiratory failure with hypercapnia: Secondary | ICD-10-CM | POA: Diagnosis not present

## 2022-02-06 ENCOUNTER — Inpatient Hospital Stay (HOSPITAL_COMMUNITY)
Admission: EM | Admit: 2022-02-06 | Discharge: 2022-02-19 | DRG: 469 | Disposition: A | Discharge status: Skilled Nursing Facility | Payer: Medicare Other | Attending: Internal Medicine | Admitting: Internal Medicine

## 2022-02-06 ENCOUNTER — Emergency Department (HOSPITAL_COMMUNITY): Payer: Medicare Other

## 2022-02-06 ENCOUNTER — Encounter (HOSPITAL_COMMUNITY): Payer: Self-pay

## 2022-02-06 ENCOUNTER — Other Ambulatory Visit: Payer: Self-pay

## 2022-02-06 DIAGNOSIS — Z9861 Coronary angioplasty status: Secondary | ICD-10-CM | POA: Diagnosis not present

## 2022-02-06 DIAGNOSIS — M79605 Pain in left leg: Secondary | ICD-10-CM | POA: Diagnosis not present

## 2022-02-06 DIAGNOSIS — M109 Gout, unspecified: Secondary | ICD-10-CM | POA: Diagnosis present

## 2022-02-06 DIAGNOSIS — I509 Heart failure, unspecified: Secondary | ICD-10-CM

## 2022-02-06 DIAGNOSIS — D649 Anemia, unspecified: Secondary | ICD-10-CM | POA: Diagnosis not present

## 2022-02-06 DIAGNOSIS — I2510 Atherosclerotic heart disease of native coronary artery without angina pectoris: Secondary | ICD-10-CM

## 2022-02-06 DIAGNOSIS — R262 Difficulty in walking, not elsewhere classified: Secondary | ICD-10-CM | POA: Diagnosis not present

## 2022-02-06 DIAGNOSIS — K2970 Gastritis, unspecified, without bleeding: Secondary | ICD-10-CM | POA: Diagnosis present

## 2022-02-06 DIAGNOSIS — S72145K Nondisplaced intertrochanteric fracture of left femur, subsequent encounter for closed fracture with nonunion: Secondary | ICD-10-CM | POA: Diagnosis not present

## 2022-02-06 DIAGNOSIS — D696 Thrombocytopenia, unspecified: Secondary | ICD-10-CM | POA: Diagnosis not present

## 2022-02-06 DIAGNOSIS — L89102 Pressure ulcer of unspecified part of back, stage 2: Secondary | ICD-10-CM | POA: Diagnosis present

## 2022-02-06 DIAGNOSIS — T85848S Pain due to other internal prosthetic devices, implants and grafts, sequela: Secondary | ICD-10-CM | POA: Diagnosis not present

## 2022-02-06 DIAGNOSIS — M1000 Idiopathic gout, unspecified site: Secondary | ICD-10-CM | POA: Diagnosis not present

## 2022-02-06 DIAGNOSIS — R109 Unspecified abdominal pain: Secondary | ICD-10-CM | POA: Diagnosis not present

## 2022-02-06 DIAGNOSIS — S72142K Displaced intertrochanteric fracture of left femur, subsequent encounter for closed fracture with nonunion: Secondary | ICD-10-CM | POA: Diagnosis not present

## 2022-02-06 DIAGNOSIS — E782 Mixed hyperlipidemia: Secondary | ICD-10-CM | POA: Diagnosis present

## 2022-02-06 DIAGNOSIS — J811 Chronic pulmonary edema: Secondary | ICD-10-CM | POA: Diagnosis not present

## 2022-02-06 DIAGNOSIS — I428 Other cardiomyopathies: Secondary | ICD-10-CM | POA: Diagnosis not present

## 2022-02-06 DIAGNOSIS — Z8719 Personal history of other diseases of the digestive system: Secondary | ICD-10-CM

## 2022-02-06 DIAGNOSIS — G8929 Other chronic pain: Secondary | ICD-10-CM | POA: Diagnosis not present

## 2022-02-06 DIAGNOSIS — Z951 Presence of aortocoronary bypass graft: Secondary | ICD-10-CM

## 2022-02-06 DIAGNOSIS — R700 Elevated erythrocyte sedimentation rate: Secondary | ICD-10-CM | POA: Diagnosis not present

## 2022-02-06 DIAGNOSIS — Z4789 Encounter for other orthopedic aftercare: Secondary | ICD-10-CM | POA: Diagnosis not present

## 2022-02-06 DIAGNOSIS — E876 Hypokalemia: Secondary | ICD-10-CM | POA: Diagnosis not present

## 2022-02-06 DIAGNOSIS — N186 End stage renal disease: Secondary | ICD-10-CM | POA: Diagnosis not present

## 2022-02-06 DIAGNOSIS — Z882 Allergy status to sulfonamides status: Secondary | ICD-10-CM

## 2022-02-06 DIAGNOSIS — Z96642 Presence of left artificial hip joint: Secondary | ICD-10-CM | POA: Diagnosis not present

## 2022-02-06 DIAGNOSIS — T85848A Pain due to other internal prosthetic devices, implants and grafts, initial encounter: Secondary | ICD-10-CM | POA: Diagnosis not present

## 2022-02-06 DIAGNOSIS — I480 Paroxysmal atrial fibrillation: Secondary | ICD-10-CM | POA: Diagnosis not present

## 2022-02-06 DIAGNOSIS — Z87891 Personal history of nicotine dependence: Secondary | ICD-10-CM

## 2022-02-06 DIAGNOSIS — Z7982 Long term (current) use of aspirin: Secondary | ICD-10-CM | POA: Diagnosis not present

## 2022-02-06 DIAGNOSIS — Z743 Need for continuous supervision: Secondary | ICD-10-CM | POA: Diagnosis not present

## 2022-02-06 DIAGNOSIS — Z825 Family history of asthma and other chronic lower respiratory diseases: Secondary | ICD-10-CM

## 2022-02-06 DIAGNOSIS — I4891 Unspecified atrial fibrillation: Secondary | ICD-10-CM | POA: Diagnosis present

## 2022-02-06 DIAGNOSIS — Z981 Arthrodesis status: Secondary | ICD-10-CM

## 2022-02-06 DIAGNOSIS — K760 Fatty (change of) liver, not elsewhere classified: Secondary | ICD-10-CM | POA: Diagnosis not present

## 2022-02-06 DIAGNOSIS — Z91041 Radiographic dye allergy status: Secondary | ICD-10-CM

## 2022-02-06 DIAGNOSIS — E1122 Type 2 diabetes mellitus with diabetic chronic kidney disease: Secondary | ICD-10-CM | POA: Diagnosis not present

## 2022-02-06 DIAGNOSIS — S72142A Displaced intertrochanteric fracture of left femur, initial encounter for closed fracture: Secondary | ICD-10-CM | POA: Diagnosis not present

## 2022-02-06 DIAGNOSIS — E1121 Type 2 diabetes mellitus with diabetic nephropathy: Secondary | ICD-10-CM | POA: Diagnosis not present

## 2022-02-06 DIAGNOSIS — Z803 Family history of malignant neoplasm of breast: Secondary | ICD-10-CM

## 2022-02-06 DIAGNOSIS — Z95810 Presence of automatic (implantable) cardiac defibrillator: Secondary | ICD-10-CM

## 2022-02-06 DIAGNOSIS — I10 Essential (primary) hypertension: Secondary | ICD-10-CM

## 2022-02-06 DIAGNOSIS — G4730 Sleep apnea, unspecified: Secondary | ICD-10-CM | POA: Diagnosis not present

## 2022-02-06 DIAGNOSIS — R5381 Other malaise: Secondary | ICD-10-CM | POA: Diagnosis not present

## 2022-02-06 DIAGNOSIS — N184 Chronic kidney disease, stage 4 (severe): Secondary | ICD-10-CM | POA: Diagnosis not present

## 2022-02-06 DIAGNOSIS — K219 Gastro-esophageal reflux disease without esophagitis: Secondary | ICD-10-CM | POA: Diagnosis present

## 2022-02-06 DIAGNOSIS — W19XXXD Unspecified fall, subsequent encounter: Secondary | ICD-10-CM | POA: Diagnosis present

## 2022-02-06 DIAGNOSIS — M25552 Pain in left hip: Secondary | ICD-10-CM

## 2022-02-06 DIAGNOSIS — Z8673 Personal history of transient ischemic attack (TIA), and cerebral infarction without residual deficits: Secondary | ICD-10-CM

## 2022-02-06 DIAGNOSIS — D631 Anemia in chronic kidney disease: Secondary | ICD-10-CM | POA: Diagnosis present

## 2022-02-06 DIAGNOSIS — Z6835 Body mass index (BMI) 35.0-35.9, adult: Secondary | ICD-10-CM

## 2022-02-06 DIAGNOSIS — I442 Atrioventricular block, complete: Secondary | ICD-10-CM | POA: Diagnosis not present

## 2022-02-06 DIAGNOSIS — E273 Drug-induced adrenocortical insufficiency: Secondary | ICD-10-CM | POA: Diagnosis not present

## 2022-02-06 DIAGNOSIS — I5032 Chronic diastolic (congestive) heart failure: Secondary | ICD-10-CM

## 2022-02-06 DIAGNOSIS — S72112A Displaced fracture of greater trochanter of left femur, initial encounter for closed fracture: Secondary | ICD-10-CM | POA: Diagnosis present

## 2022-02-06 DIAGNOSIS — K5900 Constipation, unspecified: Secondary | ICD-10-CM | POA: Diagnosis not present

## 2022-02-06 DIAGNOSIS — J9611 Chronic respiratory failure with hypoxia: Secondary | ICD-10-CM | POA: Diagnosis not present

## 2022-02-06 DIAGNOSIS — E7800 Pure hypercholesterolemia, unspecified: Secondary | ICD-10-CM

## 2022-02-06 DIAGNOSIS — E119 Type 2 diabetes mellitus without complications: Secondary | ICD-10-CM | POA: Diagnosis not present

## 2022-02-06 DIAGNOSIS — Y792 Prosthetic and other implants, materials and accessory orthopedic devices associated with adverse incidents: Secondary | ICD-10-CM | POA: Diagnosis present

## 2022-02-06 DIAGNOSIS — B961 Klebsiella pneumoniae [K. pneumoniae] as the cause of diseases classified elsewhere: Secondary | ICD-10-CM | POA: Diagnosis not present

## 2022-02-06 DIAGNOSIS — I5042 Chronic combined systolic (congestive) and diastolic (congestive) heart failure: Secondary | ICD-10-CM | POA: Diagnosis not present

## 2022-02-06 DIAGNOSIS — K581 Irritable bowel syndrome with constipation: Secondary | ICD-10-CM | POA: Diagnosis not present

## 2022-02-06 DIAGNOSIS — R2681 Unsteadiness on feet: Secondary | ICD-10-CM | POA: Diagnosis not present

## 2022-02-06 DIAGNOSIS — G4733 Obstructive sleep apnea (adult) (pediatric): Secondary | ICD-10-CM | POA: Diagnosis not present

## 2022-02-06 DIAGNOSIS — M25452 Effusion, left hip: Secondary | ICD-10-CM | POA: Diagnosis not present

## 2022-02-06 DIAGNOSIS — J45909 Unspecified asthma, uncomplicated: Secondary | ICD-10-CM | POA: Diagnosis not present

## 2022-02-06 DIAGNOSIS — Z888 Allergy status to other drugs, medicaments and biological substances status: Secondary | ICD-10-CM

## 2022-02-06 DIAGNOSIS — R531 Weakness: Secondary | ICD-10-CM | POA: Diagnosis present

## 2022-02-06 DIAGNOSIS — Z8249 Family history of ischemic heart disease and other diseases of the circulatory system: Secondary | ICD-10-CM

## 2022-02-06 DIAGNOSIS — W19XXXA Unspecified fall, initial encounter: Secondary | ICD-10-CM | POA: Diagnosis not present

## 2022-02-06 DIAGNOSIS — E781 Pure hyperglyceridemia: Secondary | ICD-10-CM | POA: Diagnosis present

## 2022-02-06 DIAGNOSIS — Z23 Encounter for immunization: Secondary | ICD-10-CM

## 2022-02-06 DIAGNOSIS — N390 Urinary tract infection, site not specified: Secondary | ICD-10-CM | POA: Diagnosis not present

## 2022-02-06 DIAGNOSIS — Z955 Presence of coronary angioplasty implant and graft: Secondary | ICD-10-CM

## 2022-02-06 DIAGNOSIS — R7982 Elevated C-reactive protein (CRP): Secondary | ICD-10-CM | POA: Diagnosis not present

## 2022-02-06 DIAGNOSIS — N179 Acute kidney failure, unspecified: Secondary | ICD-10-CM | POA: Diagnosis present

## 2022-02-06 DIAGNOSIS — Z7902 Long term (current) use of antithrombotics/antiplatelets: Secondary | ICD-10-CM

## 2022-02-06 DIAGNOSIS — Z9049 Acquired absence of other specified parts of digestive tract: Secondary | ICD-10-CM

## 2022-02-06 DIAGNOSIS — Z741 Need for assistance with personal care: Secondary | ICD-10-CM | POA: Diagnosis not present

## 2022-02-06 DIAGNOSIS — D62 Acute posthemorrhagic anemia: Secondary | ICD-10-CM | POA: Diagnosis not present

## 2022-02-06 DIAGNOSIS — L97811 Non-pressure chronic ulcer of other part of right lower leg limited to breakdown of skin: Secondary | ICD-10-CM | POA: Diagnosis not present

## 2022-02-06 DIAGNOSIS — Z9981 Dependence on supplemental oxygen: Secondary | ICD-10-CM | POA: Diagnosis not present

## 2022-02-06 DIAGNOSIS — Z7952 Long term (current) use of systemic steroids: Secondary | ICD-10-CM

## 2022-02-06 DIAGNOSIS — I4892 Unspecified atrial flutter: Secondary | ICD-10-CM | POA: Diagnosis present

## 2022-02-06 DIAGNOSIS — J4520 Mild intermittent asthma, uncomplicated: Secondary | ICD-10-CM | POA: Diagnosis not present

## 2022-02-06 DIAGNOSIS — I959 Hypotension, unspecified: Secondary | ICD-10-CM | POA: Diagnosis not present

## 2022-02-06 DIAGNOSIS — K5730 Diverticulosis of large intestine without perforation or abscess without bleeding: Secondary | ICD-10-CM | POA: Diagnosis not present

## 2022-02-06 DIAGNOSIS — E1151 Type 2 diabetes mellitus with diabetic peripheral angiopathy without gangrene: Secondary | ICD-10-CM | POA: Diagnosis not present

## 2022-02-06 DIAGNOSIS — I872 Venous insufficiency (chronic) (peripheral): Secondary | ICD-10-CM | POA: Diagnosis not present

## 2022-02-06 DIAGNOSIS — Z79899 Other long term (current) drug therapy: Secondary | ICD-10-CM

## 2022-02-06 DIAGNOSIS — R1312 Dysphagia, oropharyngeal phase: Secondary | ICD-10-CM | POA: Diagnosis not present

## 2022-02-06 DIAGNOSIS — M1990 Unspecified osteoarthritis, unspecified site: Secondary | ICD-10-CM | POA: Diagnosis not present

## 2022-02-06 DIAGNOSIS — Z881 Allergy status to other antibiotic agents status: Secondary | ICD-10-CM

## 2022-02-06 DIAGNOSIS — I130 Hypertensive heart and chronic kidney disease with heart failure and stage 1 through stage 4 chronic kidney disease, or unspecified chronic kidney disease: Secondary | ICD-10-CM | POA: Diagnosis present

## 2022-02-06 DIAGNOSIS — J4489 Other specified chronic obstructive pulmonary disease: Secondary | ICD-10-CM | POA: Diagnosis present

## 2022-02-06 DIAGNOSIS — D519 Vitamin B12 deficiency anemia, unspecified: Secondary | ICD-10-CM | POA: Diagnosis not present

## 2022-02-06 DIAGNOSIS — M6281 Muscle weakness (generalized): Secondary | ICD-10-CM | POA: Diagnosis not present

## 2022-02-06 DIAGNOSIS — R0602 Shortness of breath: Secondary | ICD-10-CM | POA: Diagnosis not present

## 2022-02-06 DIAGNOSIS — I132 Hypertensive heart and chronic kidney disease with heart failure and with stage 5 chronic kidney disease, or end stage renal disease: Secondary | ICD-10-CM | POA: Diagnosis not present

## 2022-02-06 DIAGNOSIS — Z8781 Personal history of (healed) traumatic fracture: Secondary | ICD-10-CM

## 2022-02-06 DIAGNOSIS — K589 Irritable bowel syndrome without diarrhea: Secondary | ICD-10-CM | POA: Diagnosis not present

## 2022-02-06 DIAGNOSIS — T84125A Displacement of internal fixation device of left femur, initial encounter: Principal | ICD-10-CM | POA: Diagnosis present

## 2022-02-06 DIAGNOSIS — M545 Low back pain, unspecified: Secondary | ICD-10-CM | POA: Diagnosis present

## 2022-02-06 DIAGNOSIS — I429 Cardiomyopathy, unspecified: Secondary | ICD-10-CM

## 2022-02-06 DIAGNOSIS — Z833 Family history of diabetes mellitus: Secondary | ICD-10-CM

## 2022-02-06 DIAGNOSIS — M4319 Spondylolisthesis, multiple sites in spine: Secondary | ICD-10-CM | POA: Diagnosis not present

## 2022-02-06 DIAGNOSIS — E669 Obesity, unspecified: Secondary | ICD-10-CM | POA: Diagnosis present

## 2022-02-06 DIAGNOSIS — G4489 Other headache syndrome: Secondary | ICD-10-CM | POA: Diagnosis not present

## 2022-02-06 DIAGNOSIS — I110 Hypertensive heart disease with heart failure: Secondary | ICD-10-CM | POA: Diagnosis not present

## 2022-02-06 DIAGNOSIS — Z471 Aftercare following joint replacement surgery: Secondary | ICD-10-CM | POA: Diagnosis not present

## 2022-02-06 LAB — URINALYSIS, ROUTINE W REFLEX MICROSCOPIC
Bilirubin Urine: NEGATIVE
Glucose, UA: NEGATIVE mg/dL
Ketones, ur: NEGATIVE mg/dL
Nitrite: POSITIVE — AB
Protein, ur: 30 mg/dL — AB
Specific Gravity, Urine: 1.013 (ref 1.005–1.030)
WBC, UA: >50 WBC/hpf — ABNORMAL HIGH (ref 0–5)
pH: 5 (ref 5.0–8.0)

## 2022-02-06 LAB — CBC WITH DIFFERENTIAL/PLATELET
Abs Immature Granulocytes: 0.04 10*3/uL (ref 0.00–0.07)
Basophils Absolute: 0 10*3/uL (ref 0.0–0.1)
Basophils Relative: 1 %
Eosinophils Absolute: 0.3 10*3/uL (ref 0.0–0.5)
Eosinophils Relative: 3 %
HCT: 31.8 % — ABNORMAL LOW (ref 36.0–46.0)
Hemoglobin: 9.6 g/dL — ABNORMAL LOW (ref 12.0–15.0)
Immature Granulocytes: 1 %
Lymphocytes Relative: 10 %
Lymphs Abs: 0.8 10*3/uL (ref 0.7–4.0)
MCH: 28.8 pg (ref 26.0–34.0)
MCHC: 30.2 g/dL (ref 30.0–36.0)
MCV: 95.5 fL (ref 80.0–100.0)
Monocytes Absolute: 0.6 10*3/uL (ref 0.1–1.0)
Monocytes Relative: 7 %
Neutro Abs: 6.5 10*3/uL (ref 1.7–7.7)
Neutrophils Relative %: 78 %
Platelets: 183 10*3/uL (ref 150–400)
RBC: 3.33 MIL/uL — ABNORMAL LOW (ref 3.87–5.11)
RDW: 15.8 % — ABNORMAL HIGH (ref 11.5–15.5)
WBC: 8.2 10*3/uL (ref 4.0–10.5)
nRBC: 0 % (ref 0.0–0.2)

## 2022-02-06 LAB — BASIC METABOLIC PANEL
Anion gap: 11 (ref 5–15)
BUN: 76 mg/dL — ABNORMAL HIGH (ref 8–23)
CO2: 29 mmol/L (ref 22–32)
Calcium: 9.8 mg/dL (ref 8.9–10.3)
Chloride: 99 mmol/L (ref 98–111)
Creatinine, Ser: 3.46 mg/dL — ABNORMAL HIGH (ref 0.44–1.00)
GFR, Estimated: 13 mL/min — ABNORMAL LOW (ref >60)
Glucose, Bld: 119 mg/dL — ABNORMAL HIGH (ref 70–99)
Potassium: 4 mmol/L (ref 3.5–5.1)
Sodium: 139 mmol/L (ref 135–145)

## 2022-02-06 MED ORDER — SENNOSIDES-DOCUSATE SODIUM 8.6-50 MG PO TABS: 1.0000 | ORAL_TABLET | Freq: Every evening | ORAL | Status: DC | PRN

## 2022-02-06 MED ORDER — ROSUVASTATIN CALCIUM 20 MG PO TABS
20.0000 mg | ORAL_TABLET | Freq: Every evening | ORAL | Status: DC
  Administered 2022-02-06 – 2022-02-19 (×13): 20 mg via ORAL
  Filled 2022-02-06 (×13): qty 1

## 2022-02-06 MED ORDER — SIMETHICONE 80 MG PO CHEW: 80.0000 mg | CHEWABLE_TABLET | Freq: Four times a day (QID) | ORAL | Status: DC | PRN

## 2022-02-06 MED ORDER — INFLUENZA VAC A&B SA ADJ QUAD 0.5 ML IM PRSY
0.5000 mL | PREFILLED_SYRINGE | INTRAMUSCULAR | Status: AC
  Administered 2022-02-07: 0.5 mL via INTRAMUSCULAR
  Filled 2022-02-06: qty 0.5

## 2022-02-06 MED ORDER — MOMETASONE FURO-FORMOTEROL FUM 200-5 MCG/ACT IN AERO
2.0000 | INHALATION_SPRAY | Freq: Two times a day (BID) | RESPIRATORY_TRACT | Status: DC
  Administered 2022-02-07 – 2022-02-19 (×24): 2 via RESPIRATORY_TRACT
  Filled 2022-02-06: qty 8.8

## 2022-02-06 MED ORDER — ISOSORBIDE MONONITRATE ER 60 MG PO TB24
90.0000 mg | ORAL_TABLET | Freq: Every day | ORAL | Status: DC
  Administered 2022-02-07 – 2022-02-19 (×11): 90 mg via ORAL
  Filled 2022-02-06 (×8): qty 1
  Filled 2022-02-06: qty 3
  Filled 2022-02-06 (×3): qty 1

## 2022-02-06 MED ORDER — ENOXAPARIN SODIUM 30 MG/0.3ML IJ SOSY
30.0000 mg | PREFILLED_SYRINGE | INTRAMUSCULAR | Status: DC
  Administered 2022-02-07 – 2022-02-11 (×5): 30 mg via SUBCUTANEOUS
  Filled 2022-02-06 (×5): qty 0.3

## 2022-02-06 MED ORDER — SUCRALFATE 1 G PO TABS
1.0000 g | ORAL_TABLET | Freq: Three times a day (TID) | ORAL | Status: DC
  Administered 2022-02-06 – 2022-02-19 (×48): 1 g via ORAL
  Filled 2022-02-06 (×48): qty 1

## 2022-02-06 MED ORDER — ONDANSETRON HCL 4 MG PO TABS
4.0000 mg | ORAL_TABLET | Freq: Four times a day (QID) | ORAL | Status: DC | PRN
  Administered 2022-02-15: 4 mg via ORAL
  Filled 2022-02-06: qty 1

## 2022-02-06 MED ORDER — MORPHINE SULFATE (PF) 2 MG/ML IV SOLN
1.0000 mg | INTRAVENOUS | Status: DC | PRN
  Administered 2022-02-07 – 2022-02-11 (×14): 1 mg via INTRAVENOUS
  Filled 2022-02-06 (×14): qty 1

## 2022-02-06 MED ORDER — PANTOPRAZOLE SODIUM 40 MG PO TBEC
40.0000 mg | DELAYED_RELEASE_TABLET | Freq: Two times a day (BID) | ORAL | Status: DC
  Administered 2022-02-06 – 2022-02-19 (×25): 40 mg via ORAL
  Filled 2022-02-06 (×25): qty 1

## 2022-02-06 MED ORDER — HYDRALAZINE HCL 20 MG/ML IJ SOLN: 5.0000 mg | INTRAMUSCULAR | Status: DC | PRN

## 2022-02-06 MED ORDER — ALLOPURINOL 100 MG PO TABS
100.0000 mg | ORAL_TABLET | Freq: Every day | ORAL | Status: DC
  Administered 2022-02-07 – 2022-02-19 (×13): 100 mg via ORAL
  Filled 2022-02-06 (×13): qty 1

## 2022-02-06 MED ORDER — HYDROCODONE-ACETAMINOPHEN 5-325 MG PO TABS
1.0000 | ORAL_TABLET | Freq: Once | ORAL | Status: AC
  Administered 2022-02-06: 1 via ORAL
  Filled 2022-02-06: qty 1

## 2022-02-06 MED ORDER — LINACLOTIDE 145 MCG PO CAPS
290.0000 ug | ORAL_CAPSULE | Freq: Every morning | ORAL | Status: DC
  Administered 2022-02-07 – 2022-02-16 (×7): 290 ug via ORAL
  Filled 2022-02-06 (×13): qty 2

## 2022-02-06 MED ORDER — ACETAMINOPHEN 650 MG RE SUPP: 650.0000 mg | Freq: Four times a day (QID) | RECTAL | Status: DC | PRN

## 2022-02-06 MED ORDER — ONDANSETRON HCL 4 MG/2ML IJ SOLN: 4.0000 mg | Freq: Four times a day (QID) | INTRAMUSCULAR | Status: DC | PRN

## 2022-02-06 MED ORDER — ACETAMINOPHEN 325 MG PO TABS
650.0000 mg | ORAL_TABLET | Freq: Four times a day (QID) | ORAL | Status: DC | PRN
  Administered 2022-02-08 – 2022-02-19 (×9): 650 mg via ORAL
  Filled 2022-02-06 (×9): qty 2

## 2022-02-06 NOTE — H&P (Signed)
PCP:   Lujean Amel, MD   Chief Complaint:    HPI: This is a 76 y.o. female with   medical history significant of  CAD s/p CABG '16, HFpEF, obesity, adrenal insufficiency, diabetes, HTN CKD stage III, OSA/obesity-hypoventilation syndrome on 3 L O2 chronically, complete heart block s/p PPM/ICD.  In 2/23 patient had a left hip and left superior pubic ramus fracture.  In 8/23 follow-up imaging showed evidence of atrophic nonunion.  After further discussion revision nailing was thought not to be an optimal solution at the time.  Since day after Christmas she developed right hip pain.  Resolved but she could not walk.  Pain was excruciating.  She put up with it for 2 to 3 days.  Today her home care nursing came by and advised to go to the ER.  In the ER imaging interval migration of the intramedullary rod and proximal dynamic screw traversing the prior intertrochanteric left hip fracture. The screw now traverses the femoral head and joint space, and extends through the left acetabulum. Increased impaction and comminution..  Admission has been requested.   Review of Systems:  The patient denies anorexia, fever, weight loss,, vision loss, decreased hearing, hoarseness, chest pain, syncope, dyspnea on exertion, peripheral edema, balance deficits, hemoptysis, abdominal pain, melena, hematochezia, severe indigestion/heartburn, hematuria, incontinence, genital sores, muscle weakness, suspicious skin lesions, transient blindness, difficulty walking, depression, unusual weight change, abnormal bleeding, enlarged lymph nodes, angioedema, and breast masses. Positives: Right hip pain  Past Medical History: Past Medical History:  Diagnosis Date   AICD (automatic cardioverter/defibrillator) present    downgraded to CRT-P in 2014   Anemia    Arthritis    Asthma    Atrial fibrillation ([place]) 10/24/2015   Questionable history of A Fib/A Flutter. On device interrogation on 9/7, showed 0.41mn of A Fib/A Flutter  with 1% burden.  Device check in Jan 2018 showed no sustained AF (runs of less than 30 seconds). No anticoagulation indicated.   CAD (coronary artery disease)    a. 10/09/16 LHC: SVG->LAD patent, SVG->Diag patent, SVG->RCA patent, SVG->LCx occluded. EF 60%, b. 10/31/16 LHC DES to AV groove Circ, DES to intermed branch   Chronic lower back pain    Fatty liver disease, nonalcoholic    GERD (gastroesophageal reflux disease)    Gout    H/O hiatal hernia    High cholesterol    Hyperplastic colon polyp    Hypertension    IBS (irritable bowel syndrome)    Internal hemorrhoids    INTERNAL HEMORRHOIDS WITHOUT MENTION COMP 04/12/2007   Qualifier: Diagnosis of  By: BOlevia PerchesMD, Dora M    Nonischemic cardiomyopathy ([place] 11/22/2011   Pt responded to BiV ICD- last EF 55-60% Sept 2017   Obesity    OSA (obstructive sleep apnea)    "can't tolerate a mask", wears 2L nocturnal O2   Presence of permanent cardiac pacemaker    11/02/12 [employer] V273 INTUA PPM   Stroke ([facility] 09/2020   Type II diabetes mellitus ([place]    Past Surgical History:  Procedure Laterality Date   ABDOMINAL ULTRASOUND  12/01/2011   Peripelvic cysts- #1- 2.4x1.9x2.3cm, #2-1.2x0.9x1.2cm   ANKLE FRACTURE SURGERY Right    "had rod put in"   ANTERIOR CERVICAL DECOMP/DISCECTOMY FUSION     APPENDECTOMY     BACK SURGERY     BIOPSY  12/29/2017   Procedure: BIOPSY;  Surgeon: NMauri Pole MD;  Location: [facility];  Service: Endoscopy;;   BIV ICD INSERTION CRT-D  2001?   BIV PACEMAKER GENERATOR CHANGE OUT N/A 11/02/2012   Procedure: BIV PACEMAKER GENERATOR CHANGE OUT;  Surgeon: Sanda Klein, MD;  Location: [place] CATH LAB;  Service: Cardiovascular;  Laterality: N/A;   CARDIAC CATHETERIZATION  05/17/1999   No significant coronary obstructive disease w/ mild 20% luminal irregularity of the first diag branch of the LAD   CARDIAC CATHETERIZATION  07/08/2002   No significant CAD, moderately depressed LV systolic function   CARDIAC  CATHETERIZATION Bilateral 04/26/2007   Normal findings, recommend medical therapy   CARDIAC CATHETERIZATION  02/18/2008   Moderate CAD, would benefit from having a functional study, recommend continue medical therapy   CARDIAC CATHETERIZATION  07/23/2012   Medical therapy   CARDIAC CATHETERIZATION N/A 11/24/2014   Procedure: Left Heart Cath and Coronary Angiography;  Surgeon: Troy Sine, MD;  Location: [place] CV LAB;  Service: Cardiovascular;  Laterality: N/A;   CARDIAC CATHETERIZATION  11/27/2014   Procedure: Intravascular Pressure Wire/FFR Study;  Surgeon: Peter M Martinique, MD;  Location: [place] CV LAB;  Service: Cardiovascular;;   CARDIAC CATHETERIZATION  10/09/2016   CHOLECYSTECTOMY N/A 04/09/2018   Procedure: LAPAROSCOPIC CHOLECYSTECTOMY;  Surgeon: Greer Pickerel, MD;  [facility]: [facility];  Service: General;  Laterality: N/A;   COLONOSCOPY WITH PROPOFOL N/A 12/29/2017   Procedure: COLONOSCOPY WITH PROPOFOL;  Surgeon: [name], MD;  [facility];  Service: Endoscopy;  Laterality: N/A;   CORONARY ANGIOGRAM  2010   CORONARY ARTERY BYPASS GRAFT N/A 11/29/2014   Procedure: CORONARY ARTERY BYPASS GRAFTING x 5 (LIMA-LAD, SVG-D, SVG-OM1-OM2, SVG-PD);  Surgeon: Melrose Nakayama, MD;  Location: [place];  Service: Open Heart Surgery;  Laterality: N/A;   CORONARY STENT INTERVENTION N/A 10/31/2016   Procedure: CORONARY STENT INTERVENTION;  Surgeon: Burnell Blanks, MD;  Location: [place] CV LAB;  Service: Cardiovascular;  Laterality: N/A;   ESOPHAGOGASTRODUODENOSCOPY (EGD) WITH PROPOFOL N/A 12/29/2017   Procedure: ESOPHAGOGASTRODUODENOSCOPY (EGD) WITH PROPOFOL;  Surgeon: [name], MD;  [facility];  Service: Endoscopy;  Laterality: N/A;   FRACTURE SURGERY     INSERT / REPLACE / REMOVE PACEMAKER  1999   INTRAMEDULLARY (IM) NAIL INTERTROCHANTERIC Left 04/04/2021   Procedure: INTRAMEDULLARY (IM) NAIL INTERTROCHANTRIC;  Surgeon: Vanetta Mulders,  MD;  Location: [place];  Service: Orthopedics;  Laterality: Left;   IR FLUORO GUIDE CV LINE LEFT  04/11/2021   IR US GUIDE VASC ACCESS LEFT  04/11/2021   KNEE ARTHROSCOPY Bilateral    LEFT HEART CATH AND CORS/GRAFTS ANGIOGRAPHY N/A 12/09/2017   Procedure: LEFT HEART CATH AND CORS/GRAFTS ANGIOGRAPHY;  Surgeon: Troy Sine, MD;  Location: [place] CV LAB;  Service: Cardiovascular;  Laterality: N/A;   LEFT HEART CATHETERIZATION WITH CORONARY ANGIOGRAM N/A 07/23/2012   Procedure: LEFT HEART CATHETERIZATION WITH CORONARY ANGIOGRAM;  Surgeon: Leonie Man, MD;  [facility];  Service: Cardiovascular;  Laterality: N/A;   LEXISCAN MYOVIEW  11/14/2011   Mild-moderate defect seen in Mid Inferolateral and Mid Anterolateral regions-consistant w/ infarct/scar. No significant ischemia demonstrated.   LUMBAR PERCUTANEOUS PEDICLE SCREW 4 LEVEL N/A 09/10/2021   Procedure: THORACIC SEVEN THROUGH THORACIC ELEVEN  PERCUTANEOUS PEDICLE SCREW PLACEMENT;  Surgeon: Judith Part, MD;  Location: [place];  Service: Neurosurgery;  Laterality: N/A;   POLYPECTOMY  12/29/2017   Procedure: POLYPECTOMY;  Surgeon: [name], MD;  [facility];  Service: Endoscopy;;   PPM GENERATOR CHANGEOUT N/A 12/31/2020   Procedure: PPM GENERATOR CHANGEOUT;  Surgeon: Sanda Klein, MD;  Location:  [place] INVASIVE CV LAB;  Service: Cardiovascular;  Laterality: N/A;   RIGHT/LEFT HEART CATH AND CORONARY ANGIOGRAPHY N/A 10/09/2016   Procedure: RIGHT/LEFT HEART CATH AND CORONARY ANGIOGRAPHY;  Surgeon: Jolaine Artist, MD;  Location: [place] CV LAB;  Service: Cardiovascular;  Laterality: N/A;   RIGHT/LEFT HEART CATH AND CORONARY/GRAFT ANGIOGRAPHY N/A 03/11/2019   Procedure: RIGHT/LEFT HEART CATH AND CORONARY/GRAFT ANGIOGRAPHY;  Surgeon: Jolaine Artist, MD;  Location: [place] CV LAB;  Service: Cardiovascular;  Laterality: N/A;   TEE WITHOUT CARDIOVERSION N/A 11/29/2014   Procedure: TRANSESOPHAGEAL  ECHOCARDIOGRAM (TEE);  Surgeon: Melrose Nakayama, MD;  Location: [place];  Service: Open Heart Surgery;  Laterality: N/A;   TRANSCAROTID ARTERY REVASCULARIZATION  Right 10/01/2020   Procedure: RIGHT TRANSCAROTID ARTERY REVASCULARIZATION;  Surgeon: [name], MD;  Location: [place];  Service: Vascular;  Laterality: Right;   TRANSTHORACIC ECHOCARDIOGRAM  07/23/2012   EF 55-60%, normal-mild   TUBAL LIGATION     ULTRASOUND GUIDANCE FOR VASCULAR ACCESS Left 10/01/2020   Procedure: ULTRASOUND GUIDANCE FOR VASCULAR ACCESS;  Surgeon: [name], MD;  Location: [place];  Service: Vascular;  Laterality: Left;    Medications: Prior to Admission medications   Medication Sig Start Date End Date Taking? Authorizing Provider  ACCU-CHEK AVIVA PLUS test strip Check blood sugar twice daily 10/19/19   [provider]  Accu-Chek Softclix Lancets lancets Check blood sugar twice daily 08/10/19   [provider]  acetaminophen (TYLENOL) 500 MG tablet Take 1,000 mg by mouth 3 (three) times daily as needed (for pain or headaches).    [provider]  albuterol (PROVENTIL HFA;VENTOLIN HFA) 108 (90 Base) MCG/ACT inhaler Inhale 2 puffs into the lungs every 6 (six) hours as needed for wheezing or shortness of breath. 05/02/18   Norval Morton, MD  albuterol (PROVENTIL) (2.5 MG/3ML) 0.083% nebulizer solution Take 3 mLs (2.5 mg total) by nebulization every 6 (six) hours as needed for wheezing or shortness of breath. 05/02/18   Norval Morton, MD  Alcohol Swabs (B-D SINGLE USE SWABS REGULAR) PADS  08/10/19   [provider]  allopurinol (ZYLOPRIM) 100 MG tablet Take 1 tablet (100 mg total) by mouth daily. Please contact the office to schedule appointment for additional refills. 1st Attempt. 12/05/21   Croitoru, [name], MD  aspirin EC 81 MG tablet Take 1 tablet (81 mg total) by mouth daily. 03/27/16   Erlene Quan, PA-C  Blood Glucose Monitoring Suppl (ACCU-CHEK GUIDE) w/Device  KIT  06/26/21   [provider]  budesonide-formoterol (SYMBICORT) 160-4.5 MCG/ACT inhaler Inhale 1 puff into the lungs 2 (two) times daily.    [provider]  clindamycin (CLEOCIN) 300 MG capsule Take 1 capsule (300 mg total) by mouth 3 (three) times daily. 11/20/21   Edrick Kins, DPM  clopidogrel (PLAVIX) 75 MG tablet Take 1 tablet (75 mg total) by mouth daily. 10/23/21   Croitoru, [name], MD  cycloSPORINE (RESTASIS) 0.05 % ophthalmic emulsion Place 1 drop into both eyes 2 (two) times daily.    [provider]  EPINEPHrine 0.3 mg/0.3 mL IJ SOAJ injection Inject 0.3 mg into the muscle once as needed for anaphylaxis (severe allergic reaction).    [provider]  fluticasone (FLONASE) 50 MCG/ACT nasal spray Place 1 spray into both nostrils daily.    [provider]  furosemide (LASIX) 80 MG tablet Take 1 tablet (80 mg total) by mouth 2 (two) times daily. 01/10/22   Darrick Grinder D, NP  HYDROcodone-acetaminophen (  NORCO/VICODIN) 5-325 MG tablet Take 1 tablet by mouth every 6 (six) hours as needed. 10/08/21   [provider]  hydrOXYzine (ATARAX) 25 MG tablet Take 25 mg by mouth 2 (two) times daily as needed for itching. 06/07/21   [provider]  isosorbide mononitrate (IMDUR) 30 MG 24 hr tablet Take 3 tablets (90 mg total) by mouth daily. 10/23/21   Croitoru, [name], MD  Lidocaine 0.5 % AERO Place 1 spray rectally every 6 (six) hours as needed (hemorrhoids). Preparation H rapid relief lidocaine spray    [provider]  LINZESS 290 MCG CAPS capsule Take 290 mcg by mouth every morning. 08/17/21   [provider]  metolazone (ZAROXOLYN) 2.5 MG tablet Take 1 tablet (2.5 mg total) by mouth once a week. AS NEEDED 10/23/21   Clegg, Amy D, NP  nitroGLYCERIN (NITROSTAT) 0.4 MG SL tablet Place 1 tablet (0.4 mg total) under the tongue every 5 (five) minutes as needed for chest pain. 11/13/16 03/18/22  Erlene Quan, PA-C  ondansetron (ZOFRAN)  4 MG tablet Take 4 mg by mouth daily as needed for nausea or vomiting. 07/24/21   [provider]  OVER THE COUNTER MEDICATION Apply 1 patch topically as needed (for pain).    [provider]  OXYGEN Inhale 2-3 L/min into the lungs continuous.    [provider]  oxymetazoline (AFRIN) 0.05 % nasal spray Place 1 spray into both nostrils 2 (two) times daily as needed for congestion.    [provider]  pantoprazole (PROTONIX) 40 MG tablet Take 1 tablet (40 mg total) by mouth 2 (two) times daily. 07/08/19   [name], MD  polyethylene glycol (MIRALAX / GLYCOLAX) 17 g packet Take 17 g by mouth daily as needed (constipation).    [provider]  Potassium Chloride ER 20 MEQ TBCR Take 60 mEq by mouth daily in the afternoon. 10/23/21   Clegg, Amy D, NP  predniSONE (DELTASONE) 10 MG tablet Take 1 tablet (10 mg total) by mouth daily with breakfast. 08/15/21   Caren Griffins, MD  predniSONE (DELTASONE) 10 MG tablet Take 10 mg by mouth daily with breakfast.    [provider]  rosuvastatin (CRESTOR) 20 MG tablet Take 1 tablet (20 mg total) by mouth every evening. NEED OV. 10/23/21   Croitoru, Dani Gobble, MD  simethicone (MYLICON) 80 MG chewable tablet Chew 80 mg by mouth 4 (four) times daily as needed for flatulence.    [provider]  sucralfate (CARAFATE) 1 g tablet TAKE ONE TABLET BY MOUTH FOUR TIMES DAILY with meals AND AT BEDTIME 12/14/21   Vladimir Crofts, PA-C  traMADol (ULTRAM) 50 MG tablet Take 50 mg by mouth daily as needed (pain). 06/07/21   [provider]  vitamin B-12 (CYANOCOBALAMIN) 1000 MCG tablet Take 1 tablet (1,000 mcg total) by mouth daily. 08/15/21   Caren Griffins, MD    Allergies:   Allergies  Allergen Reactions   Ivp Dye [Iodinated Contrast Media] Shortness Of Breath    No reaction to PO contrast with non-ionic dye.06-25-2014/rsm   Shellfish Allergy Anaphylaxis   Sulfa Antibiotics Shortness Of Breath    Iodine Hives   Atorvastatin Other (See Comments)    Pt states "causes bilateral leg pain/cramps."   Benadryl [Diphenhydramine] Other (See Comments)    "THIS DRIVES ME CRAZY AND MAKES ME FEEL LIKE I AM DYING"    Colchicine Nausea And Vomiting   Doxycycline Other (See Comments)    Unknown reaction,  per patient   Uloric [Febuxostat] Other (See Comments)    Unknown reaction   Cephalexin Itching and Rash   Zithromax [Azithromycin] Rash    Social History:  reports that she quit smoking about 54 years ago. Her smoking use included cigarettes. She has a 0.75 pack-year smoking history. She has never used smokeless tobacco. She reports that she does not drink alcohol and does not use drugs.  Family History: Family History  Problem Relation Age of Onset   Breast cancer Mother    Diabetes Mother    Asthma Sister    Heart disease Maternal Grandmother    Kidney disease Maternal Grandmother    Diabetes Maternal Grandmother    Glaucoma Maternal Aunt    Heart disease Maternal Aunt    Colon cancer Neg Hx    Stomach cancer Neg Hx    Pancreatic cancer Neg Hx     Physical Exam: Vitals:   02/06/22 1900 02/06/22 1915 02/06/22 1945 02/06/22 2000  BP: 125/60 (!) 131/55 (!) 145/59 (!) 109/90  Pulse: 86 88 87 92  Resp:    18  Temp:      SpO2: 100% 100% 100% 100%  Weight:      Height:        General:  Alert and oriented times three, well developed and nourished, no acute distress Eyes: PERRLA, pink conjunctiva, no scleral icterus ENT: Moist oral mucosa, neck supple, no thyromegaly Lungs: clear to ascultation, no wheeze, no crackles, no use of accessory muscles Cardiovascular: regular rate and rhythm, no regurgitation, no gallops, no murmurs. No carotid bruits, no JVD Abdomen: soft, positive BS, non-tender, non-distended, no organomegaly, not an acute abdomen GU: not examined Neuro: CN II - XII grossly intact, sensation intact Musculoskeletal: strength 5/5 all extremities, no clubbing,  cyanosis or edema.  Right hip pain Skin: no rash, no subcutaneous crepitation, no decubitus Psych: appropriate patient   Labs on Admission:  Recent Labs    02/06/22 1518  NA 139  K 4.0  CL 99  CO2 29  GLUCOSE 119*  BUN 76*  CREATININE 3.46*  CALCIUM 9.8    Recent Labs    02/06/22 1518  WBC 8.2  NEUTROABS 6.5  HGB 9.6*  HCT 31.8*  MCV 95.5  PLT 183     Radiological Exams on Admission: DG FEMUR MIN 2 VIEWS LEFT  Result Date: 02/06/2022 CLINICAL DATA:  Left leg pain. EXAM: LEFT FEMUR 2 VIEWS COMPARISON:  April 04, 2021.  January 26, 2022. FINDINGS: Status post intramedullary rod fixation of left femur and surgical internal fixation of proximal left femur fracture. There is stable appearance of the old proximal left femoral fracture. No acute fracture or dislocation is noted. IMPRESSION: Stable postsurgical changes as described above. No definite acute abnormality seen. Electronically Signed   By: Marijo [place] M.D.   On: 02/06/2022 16:23   CT PELVIS WO CONTRAST  Result Date: 02/06/2022 CLINICAL DATA:  [place] 01/26/2022, inability to ambulate, left groin and hip pain EXAM: CT PELVIS WITHOUT CONTRAST TECHNIQUE: Multidetector CT imaging of the pelvis was performed following the standard protocol without intravenous contrast. RADIATION DOSE REDUCTION: This exam was performed according to the departmental dose-optimization program which includes automated exposure control, adjustment of the mA and/or kV according to patient size and/or use of iterative reconstruction technique. COMPARISON:  01/26/2022, 09/09/2021 FINDINGS: Urinary Tract: No urinary tract calculi or obstructive uropathy. The bladder is unremarkable. Bowel: No bowel obstruction or ileus. Colonic diverticulosis without diverticulitis. No bowel wall thickening  or inflammatory change. Moderate retained stool throughout the colon. Vascular/Lymphatic: Diffuse atherosclerosis of the aorta and its branches. No pathologic  adenopathy. Reproductive:  No mass or other significant abnormality Other: No free fluid or free intraperitoneal gas. No abdominal wall hernia. Musculoskeletal: There has been displacement of the intramedullary rod and proximal dynamic screw traversing the prior comminuted left hip fracture. The dynamic screw now traverses the femoral head, joint space, and extends through the left acetabulum. Increased impaction at the prior intertrochanteric left hip fracture site may be due to recent trauma or chronic nonunion. There is marked soft tissue density surrounding the fracture site with developing heterotopic ossification. No significant callus formation along the fracture margins however. There are no other acute displaced fractures. Marked degenerative changes are seen within the lumbar spine, with stable grade 1 degenerative anterolisthesis of L5 on S1. Reconstructed images demonstrate no additional findings. IMPRESSION: 1. Interval migration of the intramedullary rod and proximal dynamic screw traversing the prior intertrochanteric left hip fracture. The screw now traverses the femoral head and joint space, and extends through the left acetabulum. Increased impaction and comminution at the prior fracture site may reflect sequela of superimposed acute injury after fall 01/26/2022, or ongoing sequela of chronic nonunion. Orthopedic consultation is recommended. 2. No other acute displaced fractures. 3. Colonic diverticulosis without diverticulitis. Electronically Signed   By: Randa Ngo M.D.   On: 02/06/2022 15:59    Assessment/Plan Present on Admission:  Right hip pain/migration of hardware -Admit to [place] consulted will see patient in the morning.  No plans for surgical intervention a.m.  Patient also quite apprehensive about surgical intervention given the fact that she had a rocky postop course in February -IN PRN pain meds -Plavix and aspirin on hold   Chronic hypoxemic respiratory  failure (HCC) COPD/asthma -Stable, Symbicort resumed, nebulizers as needed   OSA (obstructive sleep apnea) -Does not use CPAP   Physical deconditioning -PT per orthopedics discretion   HTN (hypertension), benign -Stable, Imdur resumed  Gout -Allopurinol resumed   Chronic diastolic CHF (congestive heart failure) (HCC)/CAD -Stable, resume Imdur, Crestor, metolazone, Zaroxolyn -Plavix and aspirin on hold   CHB (complete heart block) (HCC)  Atrial fibrillation (HCC) -PPM in place,   GERD -Stable, resume sucralfate and Protonix -As needed simethicone resumed   CKD 4 (HCC) -Stable, GFR 13   Pure hypercholesterolemia -Stable, Crestor resumed    Jaquel Coomer 02/06/2022, 9:45 PM

## 2022-02-06 NOTE — ED Provider Notes (Signed)
[facility] Provider Note   CSN: [id number] Arrival date & time: 02/06/22  1431     History  Chief Complaint  Patient presents with   Groin Pain    [name] is a 76 y.o. female with HTN, OSA, GERD, cardiomyopathy, CAD status post PCI and status post CABG x 5, chronic diastolic heart failure, complete heart block status post pacemaker, ESRD, thrombocytopenia, asthma, T9 vertebral fracture, stage II pressure ulcer, history of left hip fracture, iatrogenic adrenal insufficiency, who presents with left groin pain.    Pt c/o left side groin and leg pain x 2 days. Woke up from sudden left groin pain 2 days ago. Fell on 12/17, but was seen and had a pelvis xray here per chart review which was negative for fx. She did not even really have any pelvic or groin pain at that time. Does have a history of prior femoral neck fx on the left that was repaired in February 2023 with a rod/screw w/ [facility]. She denies any new trauma/falls since 12/17. Denies f/c, redness or heat to the left hip or pelvis, back pain, urinary symptoms, numbness/tingling. Has been ambulatory with a walker but painfully so.      Groin Pain       Home Medications Prior to Admission medications   Medication Sig Start Date End Date Taking? Authorizing Provider  ACCU-CHEK AVIVA PLUS test strip Check blood sugar twice daily 10/19/19   [provider]  Accu-Chek Softclix Lancets lancets Check blood sugar twice daily 08/10/19   [provider]  acetaminophen (TYLENOL) 500 MG tablet Take 1,000 mg by mouth 3 (three) times daily as needed (for pain or headaches).    [provider]  albuterol (PROVENTIL HFA;VENTOLIN HFA) 108 (90 Base) MCG/ACT inhaler Inhale 2 puffs into the lungs every 6 (six) hours as needed for wheezing or shortness of breath. 05/02/18   Norval Morton, MD  albuterol (PROVENTIL) (2.5 MG/3ML) 0.083% nebulizer solution Take  3 mLs (2.5 mg total) by nebulization every 6 (six) hours as needed for wheezing or shortness of breath. 05/02/18   Norval Morton, MD  Alcohol Swabs (B-D SINGLE USE SWABS REGULAR) PADS  08/10/19   [provider]  allopurinol (ZYLOPRIM) 100 MG tablet Take 1 tablet (100 mg total) by mouth daily. Please contact the office to schedule appointment for additional refills. 1st Attempt. 12/05/21   Croitoru, [name], MD  aspirin EC 81 MG tablet Take 1 tablet (81 mg total) by mouth daily. 03/27/16   Erlene Quan, PA-C  Blood Glucose Monitoring Suppl (ACCU-CHEK GUIDE) w/Device KIT  06/26/21   [provider]  budesonide-formoterol (SYMBICORT) 160-4.5 MCG/ACT inhaler Inhale 1 puff into the lungs 2 (two) times daily.    [provider]  clindamycin (CLEOCIN) 300 MG capsule Take 1 capsule (300 mg total) by mouth 3 (three) times daily. 11/20/21   Edrick Kins, DPM  clopidogrel (PLAVIX) 75 MG tablet Take 1 tablet (75 mg total) by mouth daily. 10/23/21   Croitoru, [name], MD  cycloSPORINE (RESTASIS) 0.05 % ophthalmic emulsion Place 1 drop into both eyes 2 (two) times daily.    [provider]  EPINEPHrine 0.3 mg/0.3 mL IJ SOAJ injection Inject 0.3 mg into the muscle once as needed for anaphylaxis (severe allergic reaction).    [provider]  fluticasone (FLONASE) 50 MCG/ACT nasal spray Place 1 spray into both nostrils daily.    [provider]  furosemide (LASIX)  80 MG tablet Take 1 tablet (80 mg total) by mouth 2 (two) times daily. 01/10/22   Clegg, Amy D, NP  HYDROcodone-acetaminophen (NORCO/VICODIN) 5-325 MG tablet Take 1 tablet by mouth every 6 (six) hours as needed. 10/08/21   [provider]  hydrOXYzine (ATARAX) 25 MG tablet Take 25 mg by mouth 2 (two) times daily as needed for itching. 06/07/21   [provider]  isosorbide mononitrate (IMDUR) 30 MG 24 hr tablet Take 3 tablets (90 mg total) by mouth daily. 10/23/21   Croitoru, [name], MD   Lidocaine 0.5 % AERO Place 1 spray rectally every 6 (six) hours as needed (hemorrhoids). Preparation H rapid relief lidocaine spray    [provider]  LINZESS 290 MCG CAPS capsule Take 290 mcg by mouth every morning. 08/17/21   [provider]  metolazone (ZAROXOLYN) 2.5 MG tablet Take 1 tablet (2.5 mg total) by mouth once a week. AS NEEDED 10/23/21   Clegg, Amy D, NP  nitroGLYCERIN (NITROSTAT) 0.4 MG SL tablet Place 1 tablet (0.4 mg total) under the tongue every 5 (five) minutes as needed for chest pain. 11/13/16 03/18/22  Erlene Quan, PA-C  ondansetron (ZOFRAN) 4 MG tablet Take 4 mg by mouth daily as needed for nausea or vomiting. 07/24/21   [provider]  OVER THE COUNTER MEDICATION Apply 1 patch topically as needed (for pain).    [provider]  OXYGEN Inhale 2-3 L/min into the lungs continuous.    [provider]  oxymetazoline (AFRIN) 0.05 % nasal spray Place 1 spray into both nostrils 2 (two) times daily as needed for congestion.    [provider]  pantoprazole (PROTONIX) 40 MG tablet Take 1 tablet (40 mg total) by mouth 2 (two) times daily. 07/08/19   [name], MD  polyethylene glycol (MIRALAX / GLYCOLAX) 17 g packet Take 17 g by mouth daily as needed (constipation).    [provider]  Potassium Chloride ER 20 MEQ TBCR Take 60 mEq by mouth daily in the afternoon. 10/23/21   Clegg, Amy D, NP  predniSONE (DELTASONE) 10 MG tablet Take 1 tablet (10 mg total) by mouth daily with breakfast. 08/15/21   Caren Griffins, MD  predniSONE (DELTASONE) 10 MG tablet Take 10 mg by mouth daily with breakfast.    [provider]  rosuvastatin (CRESTOR) 20 MG tablet Take 1 tablet (20 mg total) by mouth every evening. NEED OV. 10/23/21   Croitoru, Dani Gobble, MD  simethicone (MYLICON) 80 MG chewable tablet Chew 80 mg by mouth 4 (four) times daily as needed for flatulence.    [provider]  sucralfate (CARAFATE) 1 g tablet  TAKE ONE TABLET BY MOUTH FOUR TIMES DAILY with meals AND AT BEDTIME 12/14/21   Vladimir Crofts, PA-C  traMADol (ULTRAM) 50 MG tablet Take 50 mg by mouth daily as needed (pain). 06/07/21   [provider]  vitamin B-12 (CYANOCOBALAMIN) 1000 MCG tablet Take 1 tablet (1,000 mcg total) by mouth daily. 08/15/21   Caren Griffins, MD      Allergies    Ivp dye [iodinated contrast media], Shellfish allergy, Sulfa antibiotics, Iodine, Atorvastatin, Benadryl [diphenhydramine], Colchicine, Doxycycline, Uloric [febuxostat], Cephalexin, and Zithromax [azithromycin]    Review of Systems   Review of Systems Review of systems Negative for f/c.  A 10 point review of systems was performed and is negative unless otherwise reported in HPI.  Physical Exam Updated Vital Signs BP (!) 109/90   Pulse 92  Temp 98.6 F (37 C)   Resp 18   Ht _0  (1.6 m)   Wt 85.7 kg   SpO2 100%   BMI 33.48 kg/m  Physical Exam General: Normal appearing morbidly obese female, lying in bed.  HEENT:Sclera anicteric, MMM, trachea midline.  Cardiology: RRR, no murmurs/rubs/gallops. BL radial and DP pulses equal bilaterally.  Resp: Normal respiratory rate and effort. CTAB, no wheezes, rhonchi, crackles.  Abd: Soft, non-tender, non-distended. No rebound tenderness or guarding.  GU: Deferred. MSK: Pain to palpation of the left pelvis, left greater trochanter, left inguinal region. Pelvis is stable. No erythema, wounds, evidence of trauma, ecchymoses.  Pain with passive range of motion of the left hip.  Pain palpation of the left femur without any deformities or signs of trauma.  Compartments are soft.  Intact peripheral pulses and sensation. Skin: warm, dry. No rashes or lesions. Neuro: A&Ox4, CNs II-XII grossly intact. MAEs. Sensation grossly intact.  Psych: Normal mood and affect.   ED Results / Procedures / Treatments   Labs (all labs ordered are listed, but only abnormal results are displayed) Labs Reviewed  CBC  WITH DIFFERENTIAL/PLATELET - Abnormal; Notable for the following components:      Result Value   RBC 3.33 (*)    Hemoglobin 9.6 (*)    HCT 31.8 (*)    RDW 15.8 (*)    All other components within normal limits  BASIC METABOLIC PANEL - Abnormal; Notable for the following components:   Glucose, Bld 119 (*)    BUN 76 (*)    Creatinine, Ser 3.46 (*)    GFR, Estimated 13 (*)    All other components within normal limits  URINALYSIS, ROUTINE W REFLEX MICROSCOPIC    EKG None  Radiology DG FEMUR MIN 2 VIEWS LEFT  Result Date: 02/06/2022 CLINICAL DATA:  Left leg pain. EXAM: LEFT FEMUR 2 VIEWS COMPARISON:  April 04, 2021.  January 26, 2022. FINDINGS: Status post intramedullary rod fixation of left femur and surgical internal fixation of proximal left femur fracture. There is stable appearance of the old proximal left femoral fracture. No acute fracture or dislocation is noted. IMPRESSION: Stable postsurgical changes as described above. No definite acute abnormality seen. Electronically Signed   By: Marijo Conception M.D.   On: 02/06/2022 16:23   CT PELVIS WO CONTRAST  Result Date: 02/06/2022 CLINICAL DATA:  [place] 01/26/2022, inability to ambulate, left groin and hip pain EXAM: CT PELVIS WITHOUT CONTRAST TECHNIQUE: Multidetector CT imaging of the pelvis was performed following the standard protocol without intravenous contrast. RADIATION DOSE REDUCTION: This exam was performed according to the departmental dose-optimization program which includes automated exposure control, adjustment of the mA and/or kV according to patient size and/or use of iterative reconstruction technique. COMPARISON:  01/26/2022, 09/09/2021 FINDINGS: Urinary Tract: No urinary tract calculi or obstructive uropathy. The bladder is unremarkable. Bowel: No bowel obstruction or ileus. Colonic diverticulosis without diverticulitis. No bowel wall thickening or inflammatory change. Moderate retained stool throughout the colon.  Vascular/Lymphatic: Diffuse atherosclerosis of the aorta and its branches. No pathologic adenopathy. Reproductive:  No mass or other significant abnormality Other: No free fluid or free intraperitoneal gas. No abdominal wall hernia. Musculoskeletal: There has been displacement of the intramedullary rod and proximal dynamic screw traversing the prior comminuted left hip fracture. The dynamic screw now traverses the femoral head, joint space, and extends through the left acetabulum. Increased impaction at the prior intertrochanteric left hip fracture site may be due to recent trauma or chronic  nonunion. There is marked soft tissue density surrounding the fracture site with developing heterotopic ossification. No significant callus formation along the fracture margins however. There are no other acute displaced fractures. Marked degenerative changes are seen within the lumbar spine, with stable grade 1 degenerative anterolisthesis of L5 on S1. Reconstructed images demonstrate no additional findings. IMPRESSION: 1. Interval migration of the intramedullary rod and proximal dynamic screw traversing the prior intertrochanteric left hip fracture. The screw now traverses the femoral head and joint space, and extends through the left acetabulum. Increased impaction and comminution at the prior fracture site may reflect sequela of superimposed acute injury after fall 01/26/2022, or ongoing sequela of chronic nonunion. Orthopedic consultation is recommended. 2. No other acute displaced fractures. 3. Colonic diverticulosis without diverticulitis. Electronically Signed   By: Randa Ngo M.D.   On: 02/06/2022 15:59    Procedures Procedures    Medications Ordered in ED Medications  mometasone-formoterol (DULERA) 200-5 MCG/ACT inhaler 2 puff (has no administration in time range)  isosorbide mononitrate (IMDUR) 24 hr tablet 90 mg (has no administration in time range)  pantoprazole (PROTONIX) EC tablet 40 mg (has no  administration in time range)  rosuvastatin (CRESTOR) tablet 20 mg (has no administration in time range)  simethicone (MYLICON) chewable tablet 80 mg (has no administration in time range)  sucralfate (CARAFATE) tablet 1 g (has no administration in time range)  allopurinol (ZYLOPRIM) tablet 100 mg (has no administration in time range)  linaclotide (LINZESS) capsule 290 mcg (has no administration in time range)  morphine (PF) 2 MG/ML injection 1 mg (has no administration in time range)  HYDROcodone-acetaminophen (NORCO/VICODIN) 5-325 MG per tablet 1 tablet (1 tablet Oral Given 02/06/22 1507)    ED Course/ Medical Decision Making/ A&P                          Medical Decision Making Amount and/or Complexity of Data Reviewed Labs:  Decision-making details documented in ED Course. Radiology:  Decision-making details documented in ED Course.  Risk Decision regarding hospitalization.    MDM:    Consider possible occult fracture after patient's fall on 12/17. Patient does have prior hardware implanted in L hip, consider hardware complication.  Patient has pain with passive range of motion but no fever/chills, no redness to the area, lower concern for septic arthritis. Also consider OA w/ patient's morbid obesity though this pain seems to be more acute in nature.  Patient has remained ambulatory but with severe pain.  Femur x-ray and CT of pelvis was ordered from triage to assess joint space.  Will also obtain basic labs.  Clinical Course as of 02/06/22 2205  Thu Feb 06, 2022  1636 CT PELVIS WO CONTRAST IMPRESSION: 1. Interval migration of the intramedullary rod and proximal dynamic screw traversing the prior intertrochanteric left hip fracture. The screw now traverses the femoral head and joint space, and extends through the left acetabulum. Increased impaction and comminution at the prior fracture site may reflect sequela of superimposed acute injury after fall 01/26/2022, or ongoing  sequela of chronic nonunion. Orthopedic consultation is recommended. 2. No other acute displaced fractures. 3. Colonic diverticulosis without diverticulitis.    [HN]  55 DG FEMUR MIN 2 VIEWS LEFT FINDINGS: Status post intramedullary rod fixation of left femur and surgical internal fixation of proximal left femur fracture. There is stable appearance of the old proximal left femoral fracture. No acute fracture or dislocation is noted.  IMPRESSION: Stable postsurgical changes as  described above. No definite acute abnormality seen.   [HN]  1636 Hemoglobin(!): 9.6 At baseline [HN]  1636 Creatinine(!): 3.46 At baseline [HN]  2117 Messaged ortho about plan [HN]  2137 Ortho recommending NPO at midnight. Dr. Marcelino Scot will discuss with [place] [facility]. Patient will be admitted to medicine.  [HN]    Clinical Course User Index [HN] Audley Hose, MD     Labs: I Ordered, and personally interpreted labs.  The pertinent results include:  those listed above  Imaging Studies ordered: I ordered imaging studies including femur XR, CT pelvis I independently visualized and interpreted imaging. I agree with the radiologist interpretation  Cardiac Monitoring: The patient was maintained on a cardiac monitor.  I personally viewed and interpreted the cardiac monitored which showed an underlying rhythm of: NSR  Reevaluation: After the interventions noted above, I reevaluated the patient and found that they have :improved  Social Determinants of Health: Patient lives independently, normally walks with a walker  Disposition:  Admit to medicine, NPO at midnight, plan for OR tomorrow w/ orthopedic surgery  Co morbidities that complicate the patient evaluation  Past Medical History:  Diagnosis Date   AICD (automatic cardioverter/defibrillator) present    downgraded to CRT-P in 2014   Anemia    Arthritis    Asthma    Atrial fibrillation ([place]) 10/24/2015   Questionable history of A Fib/A  Flutter. On device interrogation on 9/7, showed 0.54mn of A Fib/A Flutter with 1% burden.  Device check in Jan 2018 showed no sustained AF (runs of less than 30 seconds). No anticoagulation indicated.   CAD (coronary artery disease)    a. 10/09/16 LHC: SVG->LAD patent, SVG->Diag patent, SVG->RCA patent, SVG->LCx occluded. EF 60%, b. 10/31/16 LHC DES to AV groove Circ, DES to intermed branch   Chronic lower back pain    Fatty liver disease, nonalcoholic    GERD (gastroesophageal reflux disease)    Gout    H/O hiatal hernia    High cholesterol    Hyperplastic colon polyp    Hypertension    IBS (irritable bowel syndrome)    Internal hemorrhoids    INTERNAL HEMORRHOIDS WITHOUT MENTION COMP 04/12/2007   Qualifier: Diagnosis of  By: BOlevia PerchesMD, Dora M    [place]ischemic cardiomyopathy [place] 11/22/2011   Pt responded to BiV ICD- last EF 55-60% Sept 2017   Obesity    OSA (obstructive sleep apnea)    "can't tolerate a mask", wears 2L nocturnal O2   Presence of permanent cardiac pacemaker    11/02/12 [employer] [id number] INTUA PPM   [place] 09[place]   Type II diabetes mellitus ([facility])      Medicines Meds ordered this encounter  Medications   HYDROcodone-acetaminophen (NORCO/VICODIN) 5-325 MG per tablet 1 tablet   mometasone-formoterol (DULERA) 200-5 MCG/ACT inhaler 2 puff   isosorbide mononitrate (IMDUR) 24 hr tablet 90 mg   pantoprazole (PROTONIX) EC tablet 40 mg   rosuvastatin (CRESTOR) tablet 20 mg    NEED OV.     simethicone (MYLICON) chewable tablet 80 mg   sucralfate (CARAFATE) tablet 1 g   allopurinol (ZYLOPRIM) tablet 100 mg    Please contact the office to schedule appointment for additional refills. 1st Attempt.     linaclotide (LINZESS) capsule 290 mcg   morphine (PF) 2 MG/ML injection 1 mg    I have reviewed the patients home medicines and have made adjustments as needed  Problem List / ED Course: Problem List Items Addressed This Visit  Other   * (Principal) Pain  from implanted hardware - Primary   Other Visit Diagnoses     Acute pain of left hip                    This note was created using dictation software, which may contain spelling or grammatical errors.    Audley Hose, MD 02/06/22 [phone]

## 2022-02-06 NOTE — ED Triage Notes (Signed)
Pt c/o left side groin and leg pain and weaknessx2days. Woke up from sudden left groin pain 2 days ago. Fell on 12/17, but was seen and tx here.

## 2022-02-06 NOTE — ED Notes (Signed)
Pt brief wet. Brief changed, peri care performed, purewick applied

## 2022-02-06 NOTE — ED Provider Triage Note (Signed)
Emergency Medicine Provider Triage Evaluation Note  [name] , a 76 y.o. female  was evaluated in triage.  Pt complains of L hip pain and being unable to ambulate since fall on 12/17. Typically can walk with walker. Now has severe hip pain and groin pain.  Review of Systems  Positive: Hip pain, weakness Negative: fever  Physical Exam  Ht '5\' 3"'$  (1.6 m)   Wt 85.7 kg   BMI 33.48 kg/m  Gen:   Awake, no distress   Resp:  Normal effort  MSK:   Severe pain to L midshaft femur and L hip, pain w/ROM  Other:  +dorsalis pedis pulses   Medical Decision Making  Medically screening exam initiated at 2:49 PM.  Appropriate orders placed.  [name] was informed that the remainder of the evaluation will be completed by another provider, this initial triage assessment does not replace that evaluation, and the importance of remaining in the ED until their evaluation is complete.     [name], [place] 02/06/22 1452

## 2022-02-07 ENCOUNTER — Inpatient Hospital Stay (HOSPITAL_COMMUNITY): Payer: Medicare Other

## 2022-02-07 DIAGNOSIS — M25552 Pain in left hip: Secondary | ICD-10-CM

## 2022-02-07 DIAGNOSIS — T85848A Pain due to other internal prosthetic devices, implants and grafts, initial encounter: Secondary | ICD-10-CM

## 2022-02-07 LAB — CBC WITH DIFFERENTIAL/PLATELET
Abs Immature Granulocytes: 0.02 10*3/uL (ref 0.00–0.07)
Basophils Absolute: 0 10*3/uL (ref 0.0–0.1)
Basophils Relative: 1 %
Eosinophils Absolute: 0.4 10*3/uL (ref 0.0–0.5)
Eosinophils Relative: 5 %
HCT: 29.4 % — ABNORMAL LOW (ref 36.0–46.0)
Hemoglobin: 8.6 g/dL — ABNORMAL LOW (ref 12.0–15.0)
Immature Granulocytes: 0 %
Lymphocytes Relative: 15 %
Lymphs Abs: 1.1 10*3/uL (ref 0.7–4.0)
MCH: 28.4 pg (ref 26.0–34.0)
MCHC: 29.3 g/dL — ABNORMAL LOW (ref 30.0–36.0)
MCV: 97 fL (ref 80.0–100.0)
Monocytes Absolute: 0.6 10*3/uL (ref 0.1–1.0)
Monocytes Relative: 8 %
Neutro Abs: 5.1 10*3/uL (ref 1.7–7.7)
Neutrophils Relative %: 71 %
Platelets: 163 10*3/uL (ref 150–400)
RBC: 3.03 MIL/uL — ABNORMAL LOW (ref 3.87–5.11)
RDW: 16 % — ABNORMAL HIGH (ref 11.5–15.5)
WBC: 7.3 10*3/uL (ref 4.0–10.5)
nRBC: 0 % (ref 0.0–0.2)

## 2022-02-07 LAB — BASIC METABOLIC PANEL
Anion gap: 13 (ref 5–15)
BUN: 71 mg/dL — ABNORMAL HIGH (ref 8–23)
CO2: 26 mmol/L (ref 22–32)
Calcium: 9.2 mg/dL (ref 8.9–10.3)
Chloride: 101 mmol/L (ref 98–111)
Creatinine, Ser: 3.25 mg/dL — ABNORMAL HIGH (ref 0.44–1.00)
GFR, Estimated: 14 mL/min — ABNORMAL LOW (ref >60)
Glucose, Bld: 103 mg/dL — ABNORMAL HIGH (ref 70–99)
Potassium: 3.4 mmol/L — ABNORMAL LOW (ref 3.5–5.1)
Sodium: 140 mmol/L (ref 135–145)

## 2022-02-07 MED ORDER — CYCLOSPORINE 0.05 % OP EMUL
1.0000 [drp] | Freq: Every day | OPHTHALMIC | Status: DC
  Administered 2022-02-07 – 2022-02-19 (×12): 1 [drp] via OPHTHALMIC
  Filled 2022-02-07 (×13): qty 30

## 2022-02-07 MED ORDER — FLUTICASONE PROPIONATE 50 MCG/ACT NA SUSP: 1.0000 | Freq: Every day | NASAL | Status: DC | PRN

## 2022-02-07 MED ORDER — ALBUTEROL SULFATE (2.5 MG/3ML) 0.083% IN NEBU
2.5000 mg | INHALATION_SOLUTION | Freq: Four times a day (QID) | RESPIRATORY_TRACT | Status: DC | PRN
  Administered 2022-02-09: 2.5 mg via RESPIRATORY_TRACT
  Filled 2022-02-07: qty 3

## 2022-02-07 MED ORDER — LIDOCAINE HCL (PF) 1 % IJ SOLN
5.0000 mL | Freq: Once | INTRAMUSCULAR | Status: AC
  Administered 2022-02-07: 5 mL via INTRADERMAL

## 2022-02-07 MED ORDER — POTASSIUM CHLORIDE CRYS ER 20 MEQ PO TBCR
40.0000 meq | EXTENDED_RELEASE_TABLET | Freq: Once | ORAL | Status: AC
  Administered 2022-02-07: 40 meq via ORAL
  Filled 2022-02-07: qty 2

## 2022-02-07 MED ORDER — FUROSEMIDE 40 MG PO TABS
80.0000 mg | ORAL_TABLET | Freq: Two times a day (BID) | ORAL | Status: DC
  Administered 2022-02-07 – 2022-02-11 (×8): 80 mg via ORAL
  Filled 2022-02-07 (×8): qty 2

## 2022-02-07 MED ORDER — HYDROXYZINE HCL 25 MG PO TABS
25.0000 mg | ORAL_TABLET | Freq: Two times a day (BID) | ORAL | Status: DC | PRN
  Administered 2022-02-07 – 2022-02-19 (×11): 25 mg via ORAL
  Filled 2022-02-07 (×11): qty 1

## 2022-02-07 NOTE — Progress Notes (Signed)
PROGRESS NOTE    [name]  EXB:[id number] DOB: [birth year] DOA: 02/06/2022 PCP: Lujean Amel, MD    Brief Narrative:  76 y.o. female with   medical history significant of  CAD s/p CABG '16, HFpEF, obesity, adrenal insufficiency, diabetes, HTN CKD stage III, OSA/obesity-hypoventilation syndrome on 3 L O2 chronically, complete heart block s/p PPM/ICD.  In 2/23 patient had a left hip and left superior pubic ramus fracture.  In 8/23 follow-up imaging showed evidence of atrophic nonunion.  After further discussion revision nailing was thought not to be an optimal solution at the time.  Since day after Christmas she developed right hip pain.  Resolved but she could not walk.  She will need revision hip surgery to a total hip arthroplasty.     Assessment and Plan:  Right hip pain/migration of hardware -Admit to MedSurg -Orthopedics consult: -Please obtain VIR aspiration of the left hip with cultures -Please hold anticoagulation -Please optimize cardiopulmonary as well as renal status prior to surgical intervention -Pending further consultation with Dr. Zachery Dakins for definitive surgical management, appreciate his insight and assistance   Chronic hypoxemic respiratory failure (HCC) COPD/asthma -Stable, Symbicort resumed, nebulizers as needed   OSA (obstructive sleep apnea) -Does not use CPAP   Physical deconditioning -PT after surgery   HTN (hypertension), benign -Stable, Imdur resumed   Hypokalemia -replete  Gout -Allopurinol resumed   Chronic diastolic CHF (congestive heart failure) (HCC)/CAD -Stable, resume Imdur, Crestor -avoid hypotension -Plavix and aspirin on hold   CHB (complete heart block) (HCC)/Atrial fibrillation (HCC) -PPM in place   GERD -Stable, resume sucralfate and Protonix -As needed simethicone resumed    CKD 4 ([place]) -Stable, GFR 13 -in Feb her postoperative course was complicated by renal failure with acute need for hemodialysis.   -avoid  hypotension -may need renal consult at any sign of worsening kidney function   Pure hypercholesterolemia -Stable, Crestor resumed  DVT prophylaxis: enoxaparin (LOVENOX) injection 30 mg Start: 02/07/22 1000 SCDs Start: 02/06/22 2257    Code Status: Full Code   Disposition Plan:  Level of care: Med-Surg Status is: Inpatient Remains inpatient appropriate because: needs OR eventually    Consultants:  ortho   Subjective: Anxious about need surgery due to complications last time  Objective: Vitals:   02/06/22 2332 02/07/22 0456 02/07/22 0814 02/07/22 1118  BP: (!) 124/44 (!) 122/49  103/66  Pulse: 76 88  77  Resp: '16 16  16  '$ Temp: 98.4 F (36.9 C) 98.3 F (36.8 C)  97.7 F (36.5 C)  TempSrc: Oral Oral  Oral  SpO2: 99% 100% 98% 100%  Weight: 89.9 kg     Height: '5\' 3"'$  (1.6 m)       Intake/Output Summary (Last 24 hours) at 02/07/2022 1241 Last data filed at 02/07/2022 0340 Gross per 24 hour  Intake 240 ml  Output --  Net 240 ml   Filed Weights   02/06/22 1435 02/06/22 2332  Weight: 85.7 kg 89.9 kg    Examination:   General: Appearance:    Obese female in no acute distress     Lungs:     respirations unlabored  Heart:    Normal heart rate.    MS:   All extremities are intact.    Neurologic:   Awake, alert       Data Reviewed: I have personally reviewed following labs and imaging studies  CBC: Recent Labs  Lab 02/06/22 1518 02/07/22 0323  WBC 8.2 7.3  NEUTROABS 6.5 5.1  HGB  9.6* 8.6*  HCT 31.8* 29.4*  MCV 95.5 97.0  PLT 183 567   Basic Metabolic Panel: Recent Labs  Lab 02/06/22 1518 02/07/22 0323  NA 139 140  K 4.0 3.4*  CL 99 101  CO2 29 26  GLUCOSE 119* 103*  BUN 76* 71*  CREATININE 3.46* 3.25*  CALCIUM 9.8 9.2   GFR: Estimated Creatinine Clearance: 15.4 mL/min (A) (by C-G formula based on SCr of 3.25 mg/dL (H)). Liver Function Tests: No results for input(s): "AST", "ALT", "ALKPHOS", "BILITOT", "PROT", "ALBUMIN" in the last 168  hours. No results for input(s): "LIPASE", "AMYLASE" in the last 168 hours. No results for input(s): "AMMONIA" in the last 168 hours. Coagulation Profile: No results for input(s): "INR", "PROTIME" in the last 168 hours. Cardiac Enzymes: No results for input(s): "CKTOTAL", "CKMB", "CKMBINDEX", "TROPONINI" in the last 168 hours. BNP (last 3 results) No results for input(s): "PROBNP" in the last 8760 hours. HbA1C: No results for input(s): "HGBA1C" in the last 72 hours. CBG: No results for input(s): "GLUCAP" in the last 168 hours. Lipid Profile: No results for input(s): "CHOL", "HDL", "LDLCALC", "TRIG", "CHOLHDL", "LDLDIRECT" in the last 72 hours. Thyroid Function Tests: No results for input(s): "TSH", "T4TOTAL", "FREET4", "T3FREE", "THYROIDAB" in the last 72 hours. Anemia Panel: No results for input(s): "VITAMINB12", "FOLATE", "FERRITIN", "TIBC", "IRON", "RETICCTPCT" in the last 72 hours. Sepsis Labs: No results for input(s): "PROCALCITON", "LATICACIDVEN" in the last 168 hours.  No results found for this or any previous visit (from the past 240 hour(s)).       Radiology Studies: DG FEMUR MIN 2 VIEWS LEFT  Result Date: 02/06/2022 CLINICAL DATA:  Left leg pain. EXAM: LEFT FEMUR 2 VIEWS COMPARISON:  April 04, 2021.  January 26, 2022. FINDINGS: Status post intramedullary rod fixation of left femur and surgical internal fixation of proximal left femur fracture. There is stable appearance of the old proximal left femoral fracture. No acute fracture or dislocation is noted. IMPRESSION: Stable postsurgical changes as described above. No definite acute abnormality seen. Electronically Signed   By: Marijo Conception M.D.   On: 02/06/2022 16:23   CT PELVIS WO CONTRAST  Result Date: 02/06/2022 CLINICAL DATA:  [place] 01/26/2022, inability to ambulate, left groin and hip pain EXAM: CT PELVIS WITHOUT CONTRAST TECHNIQUE: Multidetector CT imaging of the pelvis was performed following the standard  protocol without intravenous contrast. RADIATION DOSE REDUCTION: This exam was performed according to the departmental dose-optimization program which includes automated exposure control, adjustment of the mA and/or kV according to patient size and/or use of iterative reconstruction technique. COMPARISON:  01/26/2022, 09/09/2021 FINDINGS: Urinary Tract: No urinary tract calculi or obstructive uropathy. The bladder is unremarkable. Bowel: No bowel obstruction or ileus. Colonic diverticulosis without diverticulitis. No bowel wall thickening or inflammatory change. Moderate retained stool throughout the colon. Vascular/Lymphatic: Diffuse atherosclerosis of the aorta and its branches. No pathologic adenopathy. Reproductive:  No mass or other significant abnormality Other: No free fluid or free intraperitoneal gas. No abdominal wall hernia. Musculoskeletal: There has been displacement of the intramedullary rod and proximal dynamic screw traversing the prior comminuted left hip fracture. The dynamic screw now traverses the femoral head, joint space, and extends through the left acetabulum. Increased impaction at the prior intertrochanteric left hip fracture site may be due to recent trauma or chronic nonunion. There is marked soft tissue density surrounding the fracture site with developing heterotopic ossification. No significant callus formation along the fracture margins however. There are no other acute displaced fractures. Marked  degenerative changes are seen within the lumbar spine, with stable grade 1 degenerative anterolisthesis of L5 on S1. Reconstructed images demonstrate no additional findings. IMPRESSION: 1. Interval migration of the intramedullary rod and proximal dynamic screw traversing the prior intertrochanteric left hip fracture. The screw now traverses the femoral head and joint space, and extends through the left acetabulum. Increased impaction and comminution at the prior fracture site may reflect  sequela of superimposed acute injury after fall 01/26/2022, or ongoing sequela of chronic nonunion. Orthopedic consultation is recommended. 2. No other acute displaced fractures. 3. Colonic diverticulosis without diverticulitis. Electronically Signed   By: Randa Ngo M.D.   On: 02/06/2022 15:59        Scheduled Meds:  allopurinol  100 mg Oral Daily   cycloSPORINE  1 drop Both Eyes Daily   enoxaparin (LOVENOX) injection  30 mg Subcutaneous Q24H   furosemide  80 mg Oral BID   isosorbide mononitrate  90 mg Oral Daily   linaclotide  290 mcg Oral q morning   mometasone-formoterol  2 puff Inhalation BID   pantoprazole  40 mg Oral BID   rosuvastatin  20 mg Oral QPM   sucralfate  1 g Oral TID WC & HS   Continuous Infusions:   LOS: 1 day    Time spent: 45 minutes spent on chart review, discussion with nursing staff, consultants, updating family and interview/physical exam; more than 50% of that time was spent in counseling and/or coordination of care.    Geradine Girt, DO [facility] Available via Epic secure chat 7am-7pm After these hours, please refer to coverage provider listed on [email or web address] 02/07/2022, 12:41 PM

## 2022-02-07 NOTE — Consult Note (Addendum)
ORTHOPAEDIC CONSULTATION  REQUESTING PHYSICIAN: Geradine Girt, DO  Chief Complaint: Left hip pain  HPI: [name] is a 76 y.o. female who presents with left hip pain with cephalomedullary screw migration of her intertrochanteric nailing status post fall 2 days prior.  She states that she had significant pain on Christmas Day and this proceeded to be excruciating the following days.  She came to the hospital as she can no longer tolerate this.  She does try to walk with a walker although this overall has been very limited.  She is status post cephalomedullary nailing in February of this year.  Her postoperative course was complicated by renal failure with acute need for hemodialysis.  She is a limited ambulator with a walker.  She has a history of heart block as well.  She is on Plavix although this is being held.  Past Medical History:  Diagnosis Date   AICD (automatic cardioverter/defibrillator) present    downgraded to CRT-P in 2014   Anemia    Arthritis    Asthma    Atrial fibrillation ([place]) 10/24/2015   Questionable history of A Fib/A Flutter. On device interrogation on 9/7, showed 0.59mn of A Fib/A Flutter with 1% burden.  Device check in Jan 2018 showed no sustained AF (runs of less than 30 seconds). No anticoagulation indicated.   CAD (coronary artery disease)    a. 10/09/16 LHC: SVG->LAD patent, SVG->Diag patent, SVG->RCA patent, SVG->LCx occluded. EF 60%, b. 10/31/16 LHC DES to AV groove Circ, DES to intermed branch   Chronic lower back pain    Fatty liver disease, nonalcoholic    GERD (gastroesophageal reflux disease)    Gout    H/O hiatal hernia    High cholesterol    Hyperplastic colon polyp    Hypertension    IBS (irritable bowel syndrome)    Internal hemorrhoids    INTERNAL HEMORRHOIDS WITHOUT MENTION COMP 04/12/2007   Qualifier: Diagnosis of  By: BOlevia PerchesMD, Dora M    [place]ischemic cardiomyopathy ([place] 11/22/2011   Pt responded to BiV ICD- last EF 55-60% Sept  2017   Obesity    OSA (obstructive sleep apnea)    "can't tolerate a mask", wears 2L nocturnal O2   Presence of permanent cardiac pacemaker    11/02/12 [employer] V273 INTUA PPM   Stroke ([facility] 09/2020   Type II diabetes mellitus ([place]    Past Surgical History:  Procedure Laterality Date   ABDOMINAL ULTRASOUND  12/01/2011   Peripelvic cysts- #1- 2.4x1.9x2.3cm, #2-1.2x0.9x1.2cm   ANKLE FRACTURE SURGERY Right    "had rod put in"   ANTERIOR CERVICAL DECOMP/DISCECTOMY FUSION     APPENDECTOMY     BACK SURGERY     BIOPSY  12/29/2017   Procedure: BIOPSY;  Surgeon: NMauri Pole MD;  [facility];  Service: Endoscopy;;   BIV ICD INSERTION CRT-D  2001?   BIV PACEMAKER GENERATOR CHANGE OUT N/A 11/02/2012   Procedure: BIV PACEMAKER GENERATOR CHANGE OUT;  Surgeon: MSanda Klein MD;  [facility];  Service: Cardiovascular;  Laterality: N/A;   CARDIAC CATHETERIZATION  05/17/1999   No significant coronary obstructive disease w/ mild 20% luminal irregularity of the first diag branch of the LAD   CARDIAC CATHETERIZATION  07/08/2002   No significant CAD, moderately depressed LV systolic function   CARDIAC CATHETERIZATION Bilateral 04/26/2007   Normal findings, recommend medical therapy   CARDIAC CATHETERIZATION  02/18/2008   Moderate CAD, would benefit from having a functional study,  recommend continue medical therapy   CARDIAC CATHETERIZATION  07/23/2012   Medical therapy   CARDIAC CATHETERIZATION N/A 11/24/2014   Procedure: Left Heart Cath and Coronary Angiography;  Surgeon: Troy Sine, MD;  Location: [place] CV LAB;  Service: Cardiovascular;  Laterality: N/A;   CARDIAC CATHETERIZATION  11/27/2014   Procedure: Intravascular Pressure Wire/FFR Study;  Surgeon: Peter M Martinique, MD;  Location: [place] CV LAB;  Service: Cardiovascular;;   CARDIAC CATHETERIZATION  10/09/2016   CHOLECYSTECTOMY N/A 04/09/2018   Procedure: LAPAROSCOPIC CHOLECYSTECTOMY;  Surgeon:  Greer Pickerel, MD;  [facility];  Service: General;  Laterality: N/A;   COLONOSCOPY WITH PROPOFOL N/A 12/29/2017   Procedure: COLONOSCOPY WITH PROPOFOL;  Surgeon: [name], MD;  [facility];  Service: Endoscopy;  Laterality: N/A;   CORONARY ANGIOGRAM  2010   CORONARY ARTERY BYPASS GRAFT N/A 11/29/2014   Procedure: CORONARY ARTERY BYPASS GRAFTING x 5 (LIMA-LAD, SVG-D, SVG-OM1-OM2, SVG-PD);  Surgeon: Melrose Nakayama, MD;  Location: [place];  Service: Open Heart Surgery;  Laterality: N/A;   CORONARY STENT INTERVENTION N/A 10/31/2016   Procedure: CORONARY STENT INTERVENTION;  Surgeon: Burnell Blanks, MD;  Location: [place] CV LAB;  Service: Cardiovascular;  Laterality: N/A;   ESOPHAGOGASTRODUODENOSCOPY (EGD) WITH PROPOFOL N/A 12/29/2017   Procedure: ESOPHAGOGASTRODUODENOSCOPY (EGD) WITH PROPOFOL;  Surgeon: [name], MD;  [facility];  Service: Endoscopy;  Laterality: N/A;   FRACTURE SURGERY     INSERT / REPLACE / REMOVE PACEMAKER  1999   INTRAMEDULLARY (IM) NAIL INTERTROCHANTERIC Left 04/04/2021   Procedure: INTRAMEDULLARY (IM) NAIL INTERTROCHANTRIC;  Surgeon: Vanetta Mulders, MD;  Location: [place];  Service: Orthopedics;  Laterality: Left;   IR FLUORO GUIDE CV LINE LEFT  04/11/2021   IR US GUIDE VASC ACCESS LEFT  04/11/2021   KNEE ARTHROSCOPY Bilateral    LEFT HEART CATH AND CORS/GRAFTS ANGIOGRAPHY N/A 12/09/2017   Procedure: LEFT HEART CATH AND CORS/GRAFTS ANGIOGRAPHY;  Surgeon: Troy Sine, MD;  Location: [place] CV LAB;  Service: Cardiovascular;  Laterality: N/A;   LEFT HEART CATHETERIZATION WITH CORONARY ANGIOGRAM N/A 07/23/2012   Procedure: LEFT HEART CATHETERIZATION WITH CORONARY ANGIOGRAM;  Surgeon: Leonie Man, MD;  Location: [facility] CATH LAB;  Service: Cardiovascular;  Laterality: N/A;   LEXISCAN MYOVIEW  11/14/2011   Mild-moderate defect seen in Mid Inferolateral and Mid Anterolateral regions-consistant w/ infarct/scar. No  significant ischemia demonstrated.   LUMBAR PERCUTANEOUS PEDICLE SCREW 4 LEVEL N/A 09/10/2021   Procedure: THORACIC SEVEN THROUGH THORACIC ELEVEN  PERCUTANEOUS PEDICLE SCREW PLACEMENT;  Surgeon: Judith Part, MD;  Location: [place];  Service: Neurosurgery;  Laterality: N/A;   POLYPECTOMY  12/29/2017   Procedure: POLYPECTOMY;  Surgeon: [name], MD;  [facility];  Service: Endoscopy;;   PPM GENERATOR CHANGEOUT N/A 12/31/2020   Procedure: PPM GENERATOR CHANGEOUT;  Surgeon: Sanda Klein, MD;  Location: [place] CV LAB;  Service: Cardiovascular;  Laterality: N/A;   RIGHT/LEFT HEART CATH AND CORONARY ANGIOGRAPHY N/A 10/09/2016   Procedure: RIGHT/LEFT HEART CATH AND CORONARY ANGIOGRAPHY;  Surgeon: Jolaine Artist, MD;  Location: [place] CV LAB;  Service: Cardiovascular;  Laterality: N/A;   RIGHT/LEFT HEART CATH AND CORONARY/GRAFT ANGIOGRAPHY N/A 03/11/2019   Procedure: RIGHT/LEFT HEART CATH AND CORONARY/GRAFT ANGIOGRAPHY;  Surgeon: Jolaine Artist, MD;  Location: [place] CV LAB;  Service: Cardiovascular;  Laterality: N/A;   TEE WITHOUT CARDIOVERSION N/A 11/29/2014   Procedure: TRANSESOPHAGEAL ECHOCARDIOGRAM (TEE);  Surgeon: Melrose Nakayama, MD;  Location: [facility]  OR;  Service: Open Heart Surgery;  Laterality: N/A;   TRANSCAROTID ARTERY REVASCULARIZATION  Right 10/01/2020   Procedure: RIGHT TRANSCAROTID ARTERY REVASCULARIZATION;  Surgeon: [name], MD;  Location: [place];  Service: Vascular;  Laterality: Right;   TRANSTHORACIC ECHOCARDIOGRAM  07/23/2012   EF 55-60%, normal-mild   TUBAL LIGATION     ULTRASOUND GUIDANCE FOR VASCULAR ACCESS Left 10/01/2020   Procedure: ULTRASOUND GUIDANCE FOR VASCULAR ACCESS;  Surgeon: [name], MD;  Location: [place];  Service: Vascular;  Laterality: Left;   Social History   Socioeconomic History   Marital status: Widowed    Spouse name: Treasure Ochs- deceased   Number of children: 5   Years of  education: Not on file   Highest education level: Not on file  Occupational History   Occupation: STAFF/BUFFET    Employer: [employer]  Tobacco Use   Smoking status: Former    Packs/day: 0.25    Years: 3.00    Total pack years: 0.75    Types: Cigarettes    Quit date: 02/11/1968    Years since quitting: 54.0   Smokeless tobacco: Never  Vaping Use   Vaping Use: Never used  Substance and Sexual Activity   Alcohol use: No   Drug use: No   Sexual activity: Never  Other Topics Concern   Not on file  Social History Narrative   Widowed last year. Spouse Mike Gip Sr (61) died on date 02-09-2012 04/28/22) at his home Was a [place] [employer] -[place] [employer]   She lives with her two sons, Thayer Ohm    Other sons Haze Rushing   Social Determinants of Health   Financial Resource Strain: High Risk (12/18/2016)   Overall Financial Resource Strain (CARDIA)    Difficulty of Paying Living Expenses: Very hard  Food Insecurity: No Food Insecurity (02/06/2022)   Hunger Vital Sign    Worried About Running Out of Food in the Last Year: Never true    [place] in the Last Year: Never true  Recent Concern: Food Insecurity - Food Insecurity Present (11/19/2021)   Hunger Vital Sign    Worried About Running Out of Food in the Last Year: Sometimes true    Ran Out of Food in the Last Year: Sometimes true  Transportation Needs: Unmet Transportation Needs (02/06/2022)   PRAPARE - Hydrologist (Medical): Yes    Lack of Transportation (Non-Medical): No  Physical Activity: Inactive (12/18/2016)   Exercise Vital Sign    Days of Exercise per Week: 0 days    Minutes of Exercise per Session: 0 min  Stress: Stress Concern Present (12/18/2016)   [place]    Feeling of Stress : Very much  Social Connections: Moderately Integrated (02/05/2021)   Social Connection and Isolation  Panel [NHANES]    Frequency of Communication with Friends and Family: Three times a week    Frequency of Social Gatherings with Friends and Family: Three times a week    Attends Religious Services: 1 to 4 times per year    Active Member of Clubs or Organizations: Yes    Attends Archivist Meetings: 1 to 4 times per year    Marital Status: Widowed   Family History  Problem Relation Age of Onset   Breast cancer Mother    Diabetes Mother    Asthma Sister    Heart disease Maternal  Grandmother    Kidney disease Maternal Grandmother    Diabetes Maternal Grandmother    Glaucoma Maternal Aunt    Heart disease Maternal Aunt    Colon cancer Neg Hx    Stomach cancer Neg Hx    Pancreatic cancer Neg Hx    - negative except otherwise stated in the family history section Allergies  Allergen Reactions   Ivp Dye [Iodinated Contrast Media] Shortness Of Breath    No reaction to PO contrast with non-ionic dye.06-25-2014/rsm   Shellfish Allergy Anaphylaxis   Sulfa Antibiotics Shortness Of Breath   Iodine Hives   Atorvastatin Other (See Comments)    Pt states "causes bilateral leg pain/cramps."   Benadryl [Diphenhydramine] Other (See Comments)    "THIS DRIVES ME CRAZY AND MAKES ME FEEL LIKE I AM DYING"    Colchicine Nausea And Vomiting   Doxycycline Other (See Comments)    Unknown reaction, per patient   Uloric [Febuxostat] Other (See Comments)    Unknown reaction   Cephalexin Itching and Rash   Zithromax [Azithromycin] Rash   Prior to Admission medications   Medication Sig Start Date End Date Taking? Authorizing Provider  ACCU-CHEK AVIVA PLUS test strip Check blood sugar twice daily 10/19/19   [provider]  Accu-Chek Softclix Lancets lancets Check blood sugar twice daily 08/10/19   [provider]  acetaminophen (TYLENOL) 500 MG tablet Take 1,000 mg by mouth 3 (three) times daily as needed (for pain or headaches).    [provider]  albuterol (PROVENTIL  HFA;VENTOLIN HFA) 108 (90 Base) MCG/ACT inhaler Inhale 2 puffs into the lungs every 6 (six) hours as needed for wheezing or shortness of breath. 05/02/18   Norval Morton, MD  albuterol (PROVENTIL) (2.5 MG/3ML) 0.083% nebulizer solution Take 3 mLs (2.5 mg total) by nebulization every 6 (six) hours as needed for wheezing or shortness of breath. 05/02/18   Norval Morton, MD  Alcohol Swabs (B-D SINGLE USE SWABS REGULAR) PADS  08/10/19   [provider]  allopurinol (ZYLOPRIM) 100 MG tablet Take 1 tablet (100 mg total) by mouth daily. Please contact the office to schedule appointment for additional refills. 1st Attempt. 12/05/21   Croitoru, [name], MD  aspirin EC 81 MG tablet Take 1 tablet (81 mg total) by mouth daily. 03/27/16   Erlene Quan, PA-C  Blood Glucose Monitoring Suppl (ACCU-CHEK GUIDE) w/Device KIT  06/26/21   [provider]  budesonide-formoterol (SYMBICORT) 160-4.5 MCG/ACT inhaler Inhale 1 puff into the lungs 2 (two) times daily.    [provider]  clindamycin (CLEOCIN) 300 MG capsule Take 1 capsule (300 mg total) by mouth 3 (three) times daily. 11/20/21   Edrick Kins, DPM  clopidogrel (PLAVIX) 75 MG tablet Take 1 tablet (75 mg total) by mouth daily. 10/23/21   Croitoru, [name], MD  cycloSPORINE (RESTASIS) 0.05 % ophthalmic emulsion Place 1 drop into both eyes 2 (two) times daily.    [provider]  EPINEPHrine 0.3 mg/0.3 mL IJ SOAJ injection Inject 0.3 mg into the muscle once as needed for anaphylaxis (severe allergic reaction).    [provider]  fluticasone (FLONASE) 50 MCG/ACT nasal spray Place 1 spray into both nostrils daily.    [provider]  furosemide (LASIX) 80 MG tablet Take 1 tablet (80 mg total) by mouth 2 (two) times daily. 01/10/22   Clegg, Amy D, NP  HYDROcodone-acetaminophen (NORCO/VICODIN) 5-325 MG tablet Take 1 tablet by mouth every 6 (six) hours as needed. 10/08/21  [provider]  hydrOXYzine (ATARAX)  25 MG tablet Take 25 mg by mouth 2 (two) times daily as needed for itching. 06/07/21   [provider]  isosorbide mononitrate (IMDUR) 30 MG 24 hr tablet Take 3 tablets (90 mg total) by mouth daily. 10/23/21   Croitoru, [name], MD  Lidocaine 0.5 % AERO Place 1 spray rectally every 6 (six) hours as needed (hemorrhoids). Preparation H rapid relief lidocaine spray    [provider]  LINZESS 290 MCG CAPS capsule Take 290 mcg by mouth every morning. 08/17/21   [provider]  metolazone (ZAROXOLYN) 2.5 MG tablet Take 1 tablet (2.5 mg total) by mouth once a week. AS NEEDED 10/23/21   Clegg, Amy D, NP  nitroGLYCERIN (NITROSTAT) 0.4 MG SL tablet Place 1 tablet (0.4 mg total) under the tongue every 5 (five) minutes as needed for chest pain. 11/13/16 03/18/22  Erlene Quan, PA-C  ondansetron (ZOFRAN) 4 MG tablet Take 4 mg by mouth daily as needed for nausea or vomiting. 07/24/21   [provider]  OVER THE COUNTER MEDICATION Apply 1 patch topically as needed (for pain).    [provider]  OXYGEN Inhale 2-3 L/min into the lungs continuous.    [provider]  oxymetazoline (AFRIN) 0.05 % nasal spray Place 1 spray into both nostrils 2 (two) times daily as needed for congestion.    [provider]  pantoprazole (PROTONIX) 40 MG tablet Take 1 tablet (40 mg total) by mouth 2 (two) times daily. 07/08/19   [name], MD  polyethylene glycol (MIRALAX / GLYCOLAX) 17 g packet Take 17 g by mouth daily as needed (constipation).    [provider]  Potassium Chloride ER 20 MEQ TBCR Take 60 mEq by mouth daily in the afternoon. 10/23/21   Clegg, Amy D, NP  predniSONE (DELTASONE) 10 MG tablet Take 1 tablet (10 mg total) by mouth daily with breakfast. 08/15/21   Caren Griffins, MD  predniSONE (DELTASONE) 10 MG tablet Take 10 mg by mouth daily with breakfast.    [provider]  rosuvastatin (CRESTOR) 20 MG tablet Take 1 tablet (20 mg total) by  mouth every evening. NEED OV. 10/23/21   Croitoru, Dani Gobble, MD  simethicone (MYLICON) 80 MG chewable tablet Chew 80 mg by mouth 4 (four) times daily as needed for flatulence.    [provider]  sucralfate (CARAFATE) 1 g tablet TAKE ONE TABLET BY MOUTH FOUR TIMES DAILY with meals AND AT BEDTIME 12/14/21   Vladimir Crofts, PA-C  traMADol (ULTRAM) 50 MG tablet Take 50 mg by mouth daily as needed (pain). 06/07/21   [provider]  vitamin B-12 (CYANOCOBALAMIN) 1000 MCG tablet Take 1 tablet (1,000 mcg total) by mouth daily. 08/15/21   Caren Griffins, MD   DG FEMUR MIN 2 VIEWS LEFT  Result Date: 02/06/2022 CLINICAL DATA:  Left leg pain. EXAM: LEFT FEMUR 2 VIEWS COMPARISON:  April 04, 2021.  January 26, 2022. FINDINGS: Status post intramedullary rod fixation of left femur and surgical internal fixation of proximal left femur fracture. There is stable appearance of the old proximal left femoral fracture. No acute fracture or dislocation is noted. IMPRESSION: Stable postsurgical changes as described above. No definite acute abnormality seen. Electronically Signed   By: Marijo Conception M.D.   On: 02/06/2022 16:23   CT PELVIS WO CONTRAST  Result Date: 02/06/2022 CLINICAL DATA:  [place] 01/26/2022, inability to ambulate, left groin and hip pain EXAM: CT  PELVIS WITHOUT CONTRAST TECHNIQUE: Multidetector CT imaging of the pelvis was performed following the standard protocol without intravenous contrast. RADIATION DOSE REDUCTION: This exam was performed according to the departmental dose-optimization program which includes automated exposure control, adjustment of the mA and/or kV according to patient size and/or use of iterative reconstruction technique. COMPARISON:  01/26/2022, 09/09/2021 FINDINGS: Urinary Tract: No urinary tract calculi or obstructive uropathy. The bladder is unremarkable. Bowel: No bowel obstruction or ileus. Colonic diverticulosis without diverticulitis. No bowel wall  thickening or inflammatory change. Moderate retained stool throughout the colon. Vascular/Lymphatic: Diffuse atherosclerosis of the aorta and its branches. No pathologic adenopathy. Reproductive:  No mass or other significant abnormality Other: No free fluid or free intraperitoneal gas. No abdominal wall hernia. Musculoskeletal: There has been displacement of the intramedullary rod and proximal dynamic screw traversing the prior comminuted left hip fracture. The dynamic screw now traverses the femoral head, joint space, and extends through the left acetabulum. Increased impaction at the prior intertrochanteric left hip fracture site may be due to recent trauma or chronic nonunion. There is marked soft tissue density surrounding the fracture site with developing heterotopic ossification. No significant callus formation along the fracture margins however. There are no other acute displaced fractures. Marked degenerative changes are seen within the lumbar spine, with stable grade 1 degenerative anterolisthesis of L5 on S1. Reconstructed images demonstrate no additional findings. IMPRESSION: 1. Interval migration of the intramedullary rod and proximal dynamic screw traversing the prior intertrochanteric left hip fracture. The screw now traverses the femoral head and joint space, and extends through the left acetabulum. Increased impaction and comminution at the prior fracture site may reflect sequela of superimposed acute injury after fall 01/26/2022, or ongoing sequela of chronic nonunion. Orthopedic consultation is recommended. 2. No other acute displaced fractures. 3. Colonic diverticulosis without diverticulitis. Electronically Signed   By: Randa Ngo M.D.   On: 02/06/2022 15:59     Positive ROS: All other systems have been reviewed and were otherwise negative with the exception of those mentioned in the HPI and as above.  Physical Exam: General: No acute distress Cardiovascular: No pedal  edema Respiratory: No cyanosis, no use of accessory musculature GI: No organomegaly, abdomen is soft and non-tender Skin: No lesions in the area of chief complaint Neurologic: Sensation intact distally Psychiatric: Patient is at baseline mood and affect Lymphatic: No axillary or cervical lymphadenopathy  MUSCULOSKELETAL:  Left hip with tenderness with any attempted logroll.  Limb lengths are equal.  Sensation is intact with distal neurosensory exam intact EHL as well as gastrocsoleus.  Independent Imaging Review: Ct scan pelvis, femur left 2 views x-ray: There is now cut out of the lag screw component in the femoral head through the acetabulum in the setting of a nonunited intertrochanteric hip fracture  Assessment: 76 year old female with a nonunited left intertrochanteric hip fracture nearly 1 year out with lag screw displacement through the acetabulum.  Overall given the fact that this is now significantly displaced I do believe that she will need revision hip surgery to a total hip arthroplasty.  I would like to rule out infection with an IR aspiration given the nonunion.  I have discussed her complex case with Dr. Zachery Dakins I will appreciate his assistance with her surgery.  Prior to revision we would plan to have a negative culture as well as have her optimized as best as possible from a cardiac and renal perspective.  She also needs to be off Plavix for a minimum of 72 hours  given the complexity of the surgery.  Plan: -Please obtain VIR aspiration of the left hip with cultures -Please hold anticoagulation -Please optimize cardiopulmonary as well as renal status prior to surgical intervention -Pending further consultation with Dr. Zachery Dakins for definitive surgical management, appreciate his insight and assistance  Thank you for the consult and the opportunity to see Ms. Deon Pilling, MD [facility] 6:58 AM

## 2022-02-07 NOTE — Procedures (Signed)
PROCEDURE SUMMARY:  Successful fluoroscopic guided left hip arthrocentesis. No immediate complications.  Pt tolerated well.   EBL = <1 ml  Please see full dictation in imaging section of Epic for procedure details.   Narda Rutherford, AGNP-BC 02/07/2022, 2:53 PM

## 2022-02-08 DIAGNOSIS — M25552 Pain in left hip: Secondary | ICD-10-CM | POA: Diagnosis not present

## 2022-02-08 LAB — CBC
HCT: 26.8 % — ABNORMAL LOW (ref 36.0–46.0)
Hemoglobin: 7.8 g/dL — ABNORMAL LOW (ref 12.0–15.0)
MCH: 28.1 pg (ref 26.0–34.0)
MCHC: 29.1 g/dL — ABNORMAL LOW (ref 30.0–36.0)
MCV: 96.4 fL (ref 80.0–100.0)
Platelets: 174 10*3/uL (ref 150–400)
RBC: 2.78 MIL/uL — ABNORMAL LOW (ref 3.87–5.11)
RDW: 15.7 % — ABNORMAL HIGH (ref 11.5–15.5)
WBC: 7.1 10*3/uL (ref 4.0–10.5)
nRBC: 0 % (ref 0.0–0.2)

## 2022-02-08 LAB — BASIC METABOLIC PANEL
Anion gap: 11 (ref 5–15)
BUN: 70 mg/dL — ABNORMAL HIGH (ref 8–23)
CO2: 27 mmol/L (ref 22–32)
Calcium: 8.9 mg/dL (ref 8.9–10.3)
Chloride: 101 mmol/L (ref 98–111)
Creatinine, Ser: 3 mg/dL — ABNORMAL HIGH (ref 0.44–1.00)
GFR, Estimated: 16 mL/min — ABNORMAL LOW (ref >60)
Glucose, Bld: 111 mg/dL — ABNORMAL HIGH (ref 70–99)
Potassium: 3.6 mmol/L (ref 3.5–5.1)
Sodium: 139 mmol/L (ref 135–145)

## 2022-02-08 MED ORDER — FERROUS SULFATE 325 (65 FE) MG PO TABS
325.0000 mg | ORAL_TABLET | Freq: Every day | ORAL | Status: DC
  Administered 2022-02-08 – 2022-02-19 (×11): 325 mg via ORAL
  Filled 2022-02-08 (×11): qty 1

## 2022-02-08 MED ORDER — TRIAMCINOLONE ACETONIDE 0.1 % EX CREA
TOPICAL_CREAM | Freq: Two times a day (BID) | CUTANEOUS | Status: DC
  Administered 2022-02-14: 1 via TOPICAL
  Filled 2022-02-08 (×4): qty 15

## 2022-02-08 MED ORDER — POLYETHYLENE GLYCOL 3350 17 G PO PACK
17.0000 g | PACK | Freq: Two times a day (BID) | ORAL | Status: DC
  Administered 2022-02-08 – 2022-02-16 (×10): 17 g via ORAL
  Filled 2022-02-08 (×18): qty 1

## 2022-02-08 MED ORDER — FOLIC ACID 1 MG PO TABS
1.0000 mg | ORAL_TABLET | Freq: Every day | ORAL | Status: DC
  Administered 2022-02-08 – 2022-02-19 (×11): 1 mg via ORAL
  Filled 2022-02-08 (×11): qty 1

## 2022-02-08 MED ORDER — SENNOSIDES-DOCUSATE SODIUM 8.6-50 MG PO TABS
1.0000 | ORAL_TABLET | Freq: Two times a day (BID) | ORAL | Status: DC
  Administered 2022-02-08 – 2022-02-18 (×14): 1 via ORAL
  Filled 2022-02-08 (×19): qty 1

## 2022-02-08 NOTE — Progress Notes (Signed)
PROGRESS NOTE    [name]  JIR:[id number] DOB: [birth year] DOA: 02/06/2022 PCP: Lujean Amel, MD   Brief Narrative: 76 y.o. female with medical history significant of  CAD s/p CABG '16, HFpEF, Obesity, Adrenal insufficiency, Diabetes, HTN CKD stage III, OSA/obesity-hypoventilation syndrome on 3 L O2 chronically, complete heart block s/p PPM/ICD.  In 2/23 patient had a left hip and left superior pubic ramus fracture.  In 8/23 follow-up imaging showed evidence of atrophic nonunion.  After further discussion revision nailing was thought not to be an optimal solution at the time.  Since day after Christmas she developed right hip pain.  Resolved but she could not walk.  She will need revision hip surgery to a total hip arthroplasty.     Imagine showed cephalomedullary screw migration of the intertrochanteric nailing status post fall 2 days prior to admission.  Assessment & Plan:   Principal Problem:   Pain from implanted hardware Active Problems:   Cardiomyopathy (HCC)   OSA (obstructive sleep apnea)   Thrombocytopenia (HCC)   HTN (hypertension), benign   Gastritis without bleeding   Mixed hypercholesterolemia and hypertriglyceridemia   CAD S/P percutaneous coronary angioplasty   Chronic diastolic CHF (congestive heart failure) (HCC)   CHB (complete heart block) (HCC)   Atrial fibrillation (HCC)   ESRD (end stage renal disease) (Early)   Diabetes mellitus without complication (HCC)   Irritable bowel syndrome with constipation   Congestive heart failure (CHF) (HCC)   Supplemental oxygen dependent   Hypertension   CHF (congestive heart failure) (HCC)   Diabetic renal disease (Accident)   Pure hypercholesterolemia   Physical deconditioning   Chronic hypoxemic respiratory failure (HCC)   Pressure injury of back, stage 2 (HCC)  1-Right hip pain/migration of hardware; -Ortho recommended VIR aspiration of the left hip with cultures. -Failure consultation with Dr. Zachery Dakins for  definitive surgical repair.  -Culture no growth to date.  -Needs 72 hours off Plavix.   Chronic hypoxemic respiratory failure (HCC) COPD/asthma -Stable, continue with Symbicort  nebulizers as needed  -Stable on chronic 3 L oxygen.   OSA (obstructive sleep apnea) -Does not use CPAP   Physical deconditioning -PT after surgery   HTN (hypertension), benign -Stable, continue with  Imdur    Hypokalemia -Replete   Gout -Allopurinol resumed   Chronic diastolic and systolic EF 75-10 % CHF (congestive heart failure) (HCC)/CAD -Stable, Continue with  Imdur, Crestor -avoid hypotension -Plavix and aspirin on hold   CHB (complete heart block) (HCC)/Atrial fibrillation (HCC) -PPM in place   GERD -Stable, resume sucralfate and Protonix -As needed simethicone resumed    CKD 4 (HCC) -Stable, GFR 13 -in Feb her postoperative course was complicated by renal failure with acute need for hemodialysis.   -avoid hypotension -may need renal consult at any sign of worsening kidney function  -monitor on lasix.   Anemia; check anemia panel. Suspect of chronic diseases.  Start ferrous sulfate and folic acid.    Pure hypercholesterolemia -Stable, Crestor resumed   Pressure Injury 04/13/21 Face Left;Upper Stage 1 -  Intact skin with non-blanchable redness of a localized area usually over a bony prominence. 1x1 from bipap mask (Active)  04/13/21 1200  Location: Face  Location Orientation: Left;Upper  Staging: Stage 1 -  Intact skin with non-blanchable redness of a localized area usually over a bony prominence.  Wound Description (Comments): 1x1 from bipap mask  Present on Admission:      Pressure Injury 04/13/21 Sacrum Mid Deep Tissue Pressure Injury - Purple  or maroon localized area of discolored intact skin or blood-filled blister due to damage of underlying soft tissue from pressure and/or shear. Dark purple, not blancheable- possibly (Active)  04/13/21 2000  Location: Sacrum  Location  Orientation: Mid  Staging: Deep Tissue Pressure Injury - Purple or maroon localized area of discolored intact skin or blood-filled blister due to damage of underlying soft tissue from pressure and/or shear.  Wound Description (Comments): Dark purple, not blancheable- possibly old pressure injury with DTI  Present on Admission:      Pressure Injury 11/01/21 Sacrum Medial Stage 2 -  Partial thickness loss of dermis presenting as a shallow open injury with a red, pink wound bed without slough. fissure and associated area of superficial skin breakdown over sacrum (Active)  11/01/21 1434  Location: Sacrum  Location Orientation: Medial  Staging: Stage 2 -  Partial thickness loss of dermis presenting as a shallow open injury with a red, pink wound bed without slough.  Wound Description (Comments): fissure and associated area of superficial skin breakdown over sacrum  Present on Admission: Yes                  Estimated body mass index is 35.11 kg/m as calculated from the following:   Height as of this encounter: '5\' 3"'$  (1.6 m).   Weight as of this encounter: 89.9 kg.   DVT prophylaxis: SCD Code Status: Full code Family Communication: son [name] over phone.  Disposition Plan:  Status is: Inpatient Remains inpatient appropriate because: management of hip hardware migration.     Consultants:  ortho  Procedures:    Antimicrobials:    Subjective: She denies chest pain or dyspnea.   Objective: Vitals:   02/07/22 1118 02/07/22 1333 02/07/22 2304 02/08/22 0600  BP: 103/66 (!) 99/48 111/61 (!) 114/56  Pulse: 77 86 87 86  Resp: '16 18 18 16  '$ Temp: 97.7 F (36.5 C) 97.8 F (36.6 C) 98.3 F (36.8 C) 98.1 F (36.7 C)  TempSrc: Oral Oral Oral Oral  SpO2: 100% 100% 100% 99%  Weight:      Height:        Intake/Output Summary (Last 24 hours) at 02/08/2022 0729 Last data filed at 02/08/2022 0507 Gross per 24 hour  Intake --  Output 1300 ml  Net -1300 ml   Filed Weights    02/06/22 1435 02/06/22 2332  Weight: 85.7 kg 89.9 kg    Examination:  General exam: Appears calm and comfortable  Respiratory system: Clear to auscultation. Respiratory effort normal. Cardiovascular system: S1 & S2 heard, RRR. No JVD, murmurs, rubs, gallops or clicks. No pedal edema. Gastrointestinal system: Abdomen is nondistended, soft and nontender. No organomegaly or masses felt. Normal bowel sounds heard. Central nervous system: Alert and oriented.  Extremities: Symmetric 5 x 5 power.    Data Reviewed: I have personally reviewed following labs and imaging studies  CBC: Recent Labs  Lab 02/06/22 1518 02/07/22 0323 02/08/22 0247  WBC 8.2 7.3 7.1  NEUTROABS 6.5 5.1  --   HGB 9.6* 8.6* 7.8*  HCT 31.8* 29.4* 26.8*  MCV 95.5 97.0 96.4  PLT 183 163 431   Basic Metabolic Panel: Recent Labs  Lab 02/06/22 1518 02/07/22 0323 02/08/22 0247  NA 139 140 139  K 4.0 3.4* 3.6  CL 99 101 101  CO2 '29 26 27  '$ GLUCOSE 119* 103* 111*  BUN 76* 71* 70*  CREATININE 3.46* 3.25* 3.00*  CALCIUM 9.8 9.2 8.9   GFR: Estimated Creatinine Clearance: 16.7  mL/min (A) (by C-G formula based on SCr of 3 mg/dL (H)). Liver Function Tests: No results for input(s): "AST", "ALT", "ALKPHOS", "BILITOT", "PROT", "ALBUMIN" in the last 168 hours. No results for input(s): "LIPASE", "AMYLASE" in the last 168 hours. No results for input(s): "AMMONIA" in the last 168 hours. Coagulation Profile: No results for input(s): "INR", "PROTIME" in the last 168 hours. Cardiac Enzymes: No results for input(s): "CKTOTAL", "CKMB", "CKMBINDEX", "TROPONINI" in the last 168 hours. BNP (last 3 results) No results for input(s): "PROBNP" in the last 8760 hours. HbA1C: No results for input(s): "HGBA1C" in the last 72 hours. CBG: No results for input(s): "GLUCAP" in the last 168 hours. Lipid Profile: No results for input(s): "CHOL", "HDL", "LDLCALC", "TRIG", "CHOLHDL", "LDLDIRECT" in the last 72 hours. Thyroid Function  Tests: No results for input(s): "TSH", "T4TOTAL", "FREET4", "T3FREE", "THYROIDAB" in the last 72 hours. Anemia Panel: No results for input(s): "VITAMINB12", "FOLATE", "FERRITIN", "TIBC", "IRON", "RETICCTPCT" in the last 72 hours. Sepsis Labs: No results for input(s): "PROCALCITON", "LATICACIDVEN" in the last 168 hours.  Recent Results (from the past 240 hour(s))  Body fluid culture w Gram Stain     Status: None (Preliminary result)   Collection Time: 02/07/22  3:01 PM   Specimen: PATH Cytology Misc. fluid; Body Fluid  Result Value Ref Range Status   Specimen Description FLUID LEFT HIP  Final   Special Requests NONE  Final   Gram Stain   Final    FEW WBC PRESENT, PREDOMINANTLY MONONUCLEAR NO ORGANISMS SEEN Performed at [facility], [address], [place], [place] [address]    Culture PENDING  Incomplete   Report Status PENDING  Incomplete         Radiology Studies: DG FLUORO GUIDED NEEDLE PLC ASPIRATION/INJECTION LOC  Result Date: 02/07/2022 INDICATION: Nonunited intertrochanteric left femur fracture with screw migration. Left hip joint aspiration requested to evaluate for infection prior to revision surgery. EXAM: ARTHROCENTESIS/INJECTION OF LARGE JOINT COMPARISON:  CT pelvis 01/17/2022. Left femur radiographs 01/17/2022. CONTRAST:  None FLUOROSCOPY TIME:  Radiation Exposure Index (as provided by the fluoroscopic device): 08.6 mGy Kerma COMPLICATIONS: None PROCEDURE: Informed written consent was obtained from the patient after discussion of the risks, benefits and alternatives to treatment. The patient was placed supine on the fluoroscopy table and the left extremity was placed in a slight degree of flexion and internal rotation. The left hip was localized with fluoroscopy. The skin overlying the anterior aspect of the hip was prepped and draped in usual sterile fashion and anesthetized with 1% lidocaine. An 18 gauge spinal needle was advanced into the hip joint at the lateral  aspect of the femoral head-neck junction. A fluoroscopic image was saved and sent to PACS. 2 mL of serosanguinous fluid were aspirated. A small amount of saline was injected into the joint space and aspirated with negligible return. The syringe was capped and sent for the requested laboratory studies. The needle was removed and a dressing was placed. The patient tolerated the procedure well without immediate postprocedural complication. The procedure was performed by Narda Rutherford, NP and supervised by Logan Bores, MD. IMPRESSION: Successful fluoroscopic guided aspiration of the left hip. Read by: Narda Rutherford, AGNP-BC Electronically Signed   By: Logan Bores M.D.   On: 02/07/2022 15:38   DG FEMUR MIN 2 VIEWS LEFT  Result Date: 02/06/2022 CLINICAL DATA:  Left leg pain. EXAM: LEFT FEMUR 2 VIEWS COMPARISON:  April 04, 2021.  January 26, 2022. FINDINGS: Status post intramedullary rod fixation of  left femur and surgical internal fixation of proximal left femur fracture. There is stable appearance of the old proximal left femoral fracture. No acute fracture or dislocation is noted. IMPRESSION: Stable postsurgical changes as described above. No definite acute abnormality seen. Electronically Signed   By: Marijo Conception M.D.   On: 02/06/2022 16:23   CT PELVIS WO CONTRAST  Result Date: 02/06/2022 CLINICAL DATA:  [place] 01/26/2022, inability to ambulate, left groin and hip pain EXAM: CT PELVIS WITHOUT CONTRAST TECHNIQUE: Multidetector CT imaging of the pelvis was performed following the standard protocol without intravenous contrast. RADIATION DOSE REDUCTION: This exam was performed according to the departmental dose-optimization program which includes automated exposure control, adjustment of the mA and/or kV according to patient size and/or use of iterative reconstruction technique. COMPARISON:  01/26/2022, 09/09/2021 FINDINGS: Urinary Tract: No urinary tract calculi or obstructive uropathy. The bladder is  unremarkable. Bowel: No bowel obstruction or ileus. Colonic diverticulosis without diverticulitis. No bowel wall thickening or inflammatory change. Moderate retained stool throughout the colon. Vascular/Lymphatic: Diffuse atherosclerosis of the aorta and its branches. No pathologic adenopathy. Reproductive:  No mass or other significant abnormality Other: No free fluid or free intraperitoneal gas. No abdominal wall hernia. Musculoskeletal: There has been displacement of the intramedullary rod and proximal dynamic screw traversing the prior comminuted left hip fracture. The dynamic screw now traverses the femoral head, joint space, and extends through the left acetabulum. Increased impaction at the prior intertrochanteric left hip fracture site may be due to recent trauma or chronic nonunion. There is marked soft tissue density surrounding the fracture site with developing heterotopic ossification. No significant callus formation along the fracture margins however. There are no other acute displaced fractures. Marked degenerative changes are seen within the lumbar spine, with stable grade 1 degenerative anterolisthesis of L5 on S1. Reconstructed images demonstrate no additional findings. IMPRESSION: 1. Interval migration of the intramedullary rod and proximal dynamic screw traversing the prior intertrochanteric left hip fracture. The screw now traverses the femoral head and joint space, and extends through the left acetabulum. Increased impaction and comminution at the prior fracture site may reflect sequela of superimposed acute injury after fall 01/26/2022, or ongoing sequela of chronic nonunion. Orthopedic consultation is recommended. 2. No other acute displaced fractures. 3. Colonic diverticulosis without diverticulitis. Electronically Signed   By: Randa Ngo M.D.   On: 02/06/2022 15:59        Scheduled Meds:  allopurinol  100 mg Oral Daily   cycloSPORINE  1 drop Both Eyes Daily   enoxaparin  (LOVENOX) injection  30 mg Subcutaneous Q24H   furosemide  80 mg Oral BID   isosorbide mononitrate  90 mg Oral Daily   linaclotide  290 mcg Oral q morning   mometasone-formoterol  2 puff Inhalation BID   pantoprazole  40 mg Oral BID   rosuvastatin  20 mg Oral QPM   sucralfate  1 g Oral TID WC & HS   Continuous Infusions:   LOS: 2 days    Time spent: 35 minutes.     Elmarie Shiley, MD [facility]   If 7PM-7AM, please contact night-coverage [email or web address]  02/08/2022, 7:29 AM

## 2022-02-09 ENCOUNTER — Inpatient Hospital Stay (HOSPITAL_COMMUNITY): Payer: Medicare Other

## 2022-02-09 DIAGNOSIS — T85848A Pain due to other internal prosthetic devices, implants and grafts, initial encounter: Secondary | ICD-10-CM | POA: Diagnosis not present

## 2022-02-09 LAB — CBC
HCT: 28.2 % — ABNORMAL LOW (ref 36.0–46.0)
Hemoglobin: 8.2 g/dL — ABNORMAL LOW (ref 12.0–15.0)
MCH: 28.2 pg (ref 26.0–34.0)
MCHC: 29.1 g/dL — ABNORMAL LOW (ref 30.0–36.0)
MCV: 96.9 fL (ref 80.0–100.0)
Platelets: 150 10*3/uL (ref 150–400)
RBC: 2.91 MIL/uL — ABNORMAL LOW (ref 3.87–5.11)
RDW: 15.7 % — ABNORMAL HIGH (ref 11.5–15.5)
WBC: 6.1 10*3/uL (ref 4.0–10.5)
nRBC: 0 % (ref 0.0–0.2)

## 2022-02-09 LAB — BASIC METABOLIC PANEL
Anion gap: 12 (ref 5–15)
BUN: 70 mg/dL — ABNORMAL HIGH (ref 8–23)
CO2: 29 mmol/L (ref 22–32)
Calcium: 9.2 mg/dL (ref 8.9–10.3)
Chloride: 99 mmol/L (ref 98–111)
Creatinine, Ser: 3.03 mg/dL — ABNORMAL HIGH (ref 0.44–1.00)
GFR, Estimated: 15 mL/min — ABNORMAL LOW (ref >60)
Glucose, Bld: 119 mg/dL — ABNORMAL HIGH (ref 70–99)
Potassium: 3 mmol/L — ABNORMAL LOW (ref 3.5–5.1)
Sodium: 140 mmol/L (ref 135–145)

## 2022-02-09 LAB — RETICULOCYTES
Immature Retic Fract: 15.1 % (ref 2.3–15.9)
RBC.: 2.91 MIL/uL — ABNORMAL LOW (ref 3.87–5.11)
Retic Count, Absolute: 83.5 10*3/uL (ref 19.0–186.0)
Retic Ct Pct: 2.9 % (ref 0.4–3.1)

## 2022-02-09 LAB — IRON AND TIBC
Iron: 29 ug/dL (ref 28–170)
Saturation Ratios: 16 % (ref 10.4–31.8)
TIBC: 186 ug/dL — ABNORMAL LOW (ref 250–450)
UIBC: 157 ug/dL

## 2022-02-09 LAB — FERRITIN: Ferritin: 838 ng/mL — ABNORMAL HIGH (ref 11–307)

## 2022-02-09 LAB — VITAMIN B12: Vitamin B-12: 1009 pg/mL — ABNORMAL HIGH (ref 180–914)

## 2022-02-09 LAB — FOLATE: Folate: 9.1 ng/mL (ref >5.9)

## 2022-02-09 MED ORDER — SODIUM CHLORIDE 0.9 % IV SOLN
3.0000 g | Freq: Two times a day (BID) | INTRAVENOUS | Status: AC
  Administered 2022-02-09 – 2022-02-17 (×16): 3 g via INTRAVENOUS
  Filled 2022-02-09 (×15): qty 8

## 2022-02-09 MED ORDER — BISACODYL 10 MG RE SUPP
10.0000 mg | Freq: Once | RECTAL | Status: AC
  Administered 2022-02-09: 10 mg via RECTAL
  Filled 2022-02-09: qty 1

## 2022-02-09 MED ORDER — POTASSIUM CHLORIDE CRYS ER 20 MEQ PO TBCR
40.0000 meq | EXTENDED_RELEASE_TABLET | Freq: Once | ORAL | Status: AC
  Administered 2022-02-09: 40 meq via ORAL
  Filled 2022-02-09: qty 2

## 2022-02-09 NOTE — Progress Notes (Signed)
PROGRESS NOTE    [name]  NLG:[id number] DOB: 1945-09-29 DOA: 02/06/2022 PCP: Lujean Amel, MD   Brief Narrative: 76 y.o. female with medical history significant of  CAD s/p CABG '16, HFpEF, Obesity, Adrenal insufficiency, Diabetes, HTN CKD stage III, OSA/obesity-hypoventilation syndrome on 3 L O2 chronically, complete heart block s/p PPM/ICD.  In 2/23 patient had a left hip and left superior pubic ramus fracture.  In 8/23 follow-up imaging showed evidence of atrophic nonunion.  After further discussion revision nailing was thought not to be an optimal solution at the time.  Since day after Christmas she developed right hip pain.  Resolved but she could not walk.  She will need revision hip surgery to a total hip arthroplasty.     Imagine showed cephalomedullary screw migration of the intertrochanteric nailing status post fall 2 days prior to admission.  Assessment & Plan:   Principal Problem:   Pain from implanted hardware Active Problems:   Cardiomyopathy (HCC)   OSA (obstructive sleep apnea)   Thrombocytopenia (HCC)   HTN (hypertension), benign   Gastritis without bleeding   Mixed hypercholesterolemia and hypertriglyceridemia   CAD S/P percutaneous coronary angioplasty   Chronic diastolic CHF (congestive heart failure) (HCC)   CHB (complete heart block) (HCC)   Atrial fibrillation (HCC)   ESRD (end stage renal disease) (HCC)   Diabetes mellitus without complication (HCC)   Irritable bowel syndrome with constipation   Congestive heart failure (CHF) (HCC)   Supplemental oxygen dependent   Hypertension   CHF (congestive heart failure) (HCC)   Diabetic renal disease ([place])   Pure hypercholesterolemia   Physical deconditioning   Chronic hypoxemic respiratory failure (HCC)   Pressure injury of back, stage 2 (HCC)  1-Right hip pain/migration of hardware; -Ortho recommended VIR aspiration of the left hip with cultures. -Failure consultation with Dr. Zachery Dakins for  definitive surgical repair.  -Culture no growth to date.  -Needs 72 hours off Plavix.  -awaiting ortho decision  in regards to Sx.   Chronic hypoxemic respiratory failure (HCC) COPD/asthma -Stable, continue with Symbicort  nebulizers as needed -Stable on chronic 3 L oxygen.   OSA (obstructive sleep apnea) -Does not use CPAP   Physical deconditioning -PT after surgery   HTN (hypertension), benign -Stable, continue with  Imdur    Hypokalemia -40 meq time 1.    Gout -Allopurinol resumed   Chronic diastolic and systolic EF 08-14 % CHF (congestive heart failure) (HCC)/CAD -Stable, Continue with  Imdur, Crestor -avoid hypotension -Plavix and aspirin on hold   CHB (complete heart block) (HCC)/Atrial fibrillation (HCC) -PPM in place   GERD -Stable, resume sucralfate and Protonix -As needed simethicone resumed    CKD 4 (HCC) -Stable, GFR 13 -in Feb her postoperative course was complicated by renal failure with acute need for hemodialysis.   -avoid hypotension -may need renal consult at any sign of worsening kidney function  -monitor on lasix.  Cr stable.   Anemia; check anemia panel. Suspect of chronic diseases.  Started  ferrous sulfate and folic acid.    Pure hypercholesterolemia -Stable, Crestor resumed Constipation; suppository.   UTI; report dysuria, UA with more tha 50 WBC. Start Unasyn. Follow urine culture.   Pressure Injury 04/13/21 Face Left;Upper Stage 1 -  Intact skin with non-blanchable redness of a localized area usually over a bony prominence. 1x1 from bipap mask (Active)  04/13/21 1200  Location: Face  Location Orientation: Left;Upper  Staging: Stage 1 -  Intact skin with non-blanchable redness of a localized  area usually over a bony prominence.  Wound Description (Comments): 1x1 from bipap mask  Present on Admission:      Pressure Injury 04/13/21 Sacrum Mid Deep Tissue Pressure Injury - Purple or maroon localized area of discolored intact skin or  blood-filled blister due to damage of underlying soft tissue from pressure and/or shear. Dark purple, not blancheable- possibly (Active)  04/13/21 2000  Location: Sacrum  Location Orientation: Mid  Staging: Deep Tissue Pressure Injury - Purple or maroon localized area of discolored intact skin or blood-filled blister due to damage of underlying soft tissue from pressure and/or shear.  Wound Description (Comments): Dark purple, not blancheable- possibly old pressure injury with DTI  Present on Admission:      Pressure Injury 11/01/21 Sacrum Medial Stage 2 -  Partial thickness loss of dermis presenting as a shallow open injury with a red, pink wound bed without slough. fissure and associated area of superficial skin breakdown over sacrum (Active)  11/01/21 1434  Location: Sacrum  Location Orientation: Medial  Staging: Stage 2 -  Partial thickness loss of dermis presenting as a shallow open injury with a red, pink wound bed without slough.  Wound Description (Comments): fissure and associated area of superficial skin breakdown over sacrum  Present on Admission: Yes     Estimated body mass index is 35.11 kg/m as calculated from the following:   Height as of this encounter: '5\' 3"'$  (1.6 m).   Weight as of this encounter: 89.9 kg.   DVT prophylaxis: SCD Code Status: Full code Family Communication: son [name] over phone 12/30.  Disposition Plan:  Status is: Inpatient Remains inpatient appropriate because: management of hip hardware migration.     Consultants:  ortho  Procedures:    Antimicrobials:    Subjective: She report dysuria. No BM Report right hip pain  Objective: Vitals:   02/08/22 1928 02/08/22 1940 02/09/22 0429 02/09/22 0821  BP: (!) 108/59  (!) 110/52   Pulse: 81  87   Resp: 16  16   Temp: 98 F (36.7 C)  98.2 F (36.8 C)   TempSrc: Oral     SpO2: 100% 98% 100% 100%  Weight:      Height:        Intake/Output Summary (Last 24 hours) at 02/09/2022 1011 Last  data filed at 02/09/2022 0430 Gross per 24 hour  Intake --  Output 1350 ml  Net -1350 ml    Filed Weights   02/06/22 1435 02/06/22 2332  Weight: 85.7 kg 89.9 kg    Examination:  General exam: NAD Respiratory system: CTA Cardiovascular system: S 1, S 2 RRR Gastrointestinal system: BS present, soft, nt Central nervous system:ALert Extremities: no edema    Data Reviewed: I have personally reviewed following labs and imaging studies  CBC: Recent Labs  Lab 02/06/22 1518 02/07/22 0323 02/08/22 0247 02/09/22 0236  WBC 8.2 7.3 7.1 6.1  NEUTROABS 6.5 5.1  --   --   HGB 9.6* 8.6* 7.8* 8.2*  HCT 31.8* 29.4* 26.8* 28.2*  MCV 95.5 97.0 96.4 96.9  PLT 183 163 174 450    Basic Metabolic Panel: Recent Labs  Lab 02/06/22 1518 02/07/22 0323 02/08/22 0247 02/09/22 0236  NA 139 140 139 140  K 4.0 3.4* 3.6 3.0*  CL 99 101 101 99  CO2 '29 26 27 29  '$ GLUCOSE 119* 103* 111* 119*  BUN 76* 71* 70* 70*  CREATININE 3.46* 3.25* 3.00* 3.03*  CALCIUM 9.8 9.2 8.9 9.2    GFR:  Estimated Creatinine Clearance: 16.5 mL/min (A) (by C-G formula based on SCr of 3.03 mg/dL (H)). Liver Function Tests: No results for input(s): "AST", "ALT", "ALKPHOS", "BILITOT", "PROT", "ALBUMIN" in the last 168 hours. No results for input(s): "LIPASE", "AMYLASE" in the last 168 hours. No results for input(s): "AMMONIA" in the last 168 hours. Coagulation Profile: No results for input(s): "INR", "PROTIME" in the last 168 hours. Cardiac Enzymes: No results for input(s): "CKTOTAL", "CKMB", "CKMBINDEX", "TROPONINI" in the last 168 hours. BNP (last 3 results) No results for input(s): "PROBNP" in the last 8760 hours. HbA1C: No results for input(s): "HGBA1C" in the last 72 hours. CBG: No results for input(s): "GLUCAP" in the last 168 hours. Lipid Profile: No results for input(s): "CHOL", "HDL", "LDLCALC", "TRIG", "CHOLHDL", "LDLDIRECT" in the last 72 hours. Thyroid Function Tests: No results for input(s):  "TSH", "T4TOTAL", "FREET4", "T3FREE", "THYROIDAB" in the last 72 hours. Anemia Panel: Recent Labs    02/09/22 0236  VITAMINB12 1,009*  FOLATE 9.1  FERRITIN 838*  TIBC 186*  IRON 29  RETICCTPCT 2.9   Sepsis Labs: No results for input(s): "PROCALCITON", "LATICACIDVEN" in the last 168 hours.  Recent Results (from the past 240 hour(s))  Body fluid culture w Gram Stain     Status: None (Preliminary result)   Collection Time: 02/07/22  3:01 PM   Specimen: PATH Cytology Misc. fluid; Body Fluid  Result Value Ref Range Status   Specimen Description FLUID LEFT HIP  Final   Special Requests NONE  Final   Gram Stain   Final    FEW WBC PRESENT, PREDOMINANTLY MONONUCLEAR NO ORGANISMS SEEN    Culture   Final    NO GROWTH 2 DAYS Performed at [facility], [address], [place], [place] [address]    Report Status PENDING  Incomplete         Radiology Studies: DG FLUORO GUIDED NEEDLE PLC ASPIRATION/INJECTION LOC  Result Date: 02/07/2022 INDICATION: Nonunited intertrochanteric left femur fracture with screw migration. Left hip joint aspiration requested to evaluate for infection prior to revision surgery. EXAM: ARTHROCENTESIS/INJECTION OF LARGE JOINT COMPARISON:  CT pelvis 01/17/2022. Left femur radiographs 01/17/2022. CONTRAST:  None FLUOROSCOPY TIME:  Radiation Exposure Index (as provided by the fluoroscopic device): 26.4 mGy Kerma COMPLICATIONS: None PROCEDURE: Informed written consent was obtained from the patient after discussion of the risks, benefits and alternatives to treatment. The patient was placed supine on the fluoroscopy table and the left extremity was placed in a slight degree of flexion and internal rotation. The left hip was localized with fluoroscopy. The skin overlying the anterior aspect of the hip was prepped and draped in usual sterile fashion and anesthetized with 1% lidocaine. An 18 gauge spinal needle was advanced into the hip joint at the lateral aspect of  the femoral head-neck junction. A fluoroscopic image was saved and sent to PACS. 2 mL of serosanguinous fluid were aspirated. A small amount of saline was injected into the joint space and aspirated with negligible return. The syringe was capped and sent for the requested laboratory studies. The needle was removed and a dressing was placed. The patient tolerated the procedure well without immediate postprocedural complication. The procedure was performed by Narda Rutherford, NP and supervised by Logan Bores, MD. IMPRESSION: Successful fluoroscopic guided aspiration of the left hip. Read by: Narda Rutherford, AGNP-BC Electronically Signed   By: Logan Bores M.D.   On: 02/07/2022 15:38        Scheduled Meds:  allopurinol  100 mg Oral  Daily   cycloSPORINE  1 drop Both Eyes Daily   enoxaparin (LOVENOX) injection  30 mg Subcutaneous Q24H   ferrous sulfate  325 mg Oral Q breakfast   folic acid  1 mg Oral Daily   furosemide  80 mg Oral BID   isosorbide mononitrate  90 mg Oral Daily   linaclotide  290 mcg Oral q morning   mometasone-formoterol  2 puff Inhalation BID   pantoprazole  40 mg Oral BID   polyethylene glycol  17 g Oral BID   rosuvastatin  20 mg Oral QPM   senna-docusate  1 tablet Oral BID   sucralfate  1 g Oral TID WC & HS   triamcinolone cream   Topical BID   Continuous Infusions:   LOS: 3 days    Time spent: 35 minutes.     Elmarie Shiley, MD [facility]   If 7PM-7AM, please contact night-coverage [email or web address]  02/09/2022, 10:11 AM

## 2022-02-09 NOTE — Progress Notes (Signed)
Pharmacy Antibiotic Note  [name] is a 75 y.o. female admitted on 02/06/2022 with UTI.  Pharmacy has been consulted for Unasyn dosing. Patient has a history of UTI w/Proteus Mirabilis from March 2023  Plan: Unasyn 3 grams IV every 12 hours Monitor clinical progress, cultures/sensitivities, renal function, abx plan  Height: '5\' 3"'$  (160 cm) Weight: 89.9 kg (198 lb 3.1 oz) IBW/kg (Calculated) : 52.4  Temp (24hrs), Avg:98.1 F (36.7 C), Min:98 F (36.7 C), Max:98.2 F (36.8 C)  Recent Labs  Lab 02/06/22 1518 02/07/22 0323 02/08/22 0247 02/09/22 0236  WBC 8.2 7.3 7.1 6.1  CREATININE 3.46* 3.25* 3.00* 3.03*    Estimated Creatinine Clearance: 16.5 mL/min (A) (by C-G formula based on SCr of 3.03 mg/dL (H)).    Allergies  Allergen Reactions   Ivp Dye [Iodinated Contrast Media] Shortness Of Breath    No reaction to PO contrast with non-ionic dye.06-25-2014/rsm   Shellfish Allergy Anaphylaxis   Sulfa Antibiotics Shortness Of Breath   Iodine Hives   Atorvastatin Other (See Comments)    Pt states "causes bilateral leg pain/cramps."   Benadryl [Diphenhydramine] Other (See Comments)    "THIS DRIVES ME CRAZY AND MAKES ME FEEL LIKE I AM DYING"    Colchicine Nausea And Vomiting   Doxycycline Other (See Comments)    Unknown reaction, per patient   Uloric [Febuxostat] Other (See Comments)    Unknown reaction   Cephalexin Itching and Rash   Zithromax [Azithromycin] Rash    Antimicrobials this admission: 12/31 Unasyn >>   Dose adjustments this admission:  Microbiology results: 12/31 UCx: sent    Thank you for allowing [place] to participate in this patients care. Jens Som, PharmD 02/09/2022 10:34 AM  **Pharmacist phone directory can be found on [email or web address] listed under [email or web address]**

## 2022-02-10 DIAGNOSIS — T85848A Pain due to other internal prosthetic devices, implants and grafts, initial encounter: Secondary | ICD-10-CM | POA: Diagnosis not present

## 2022-02-10 LAB — BODY FLUID CULTURE W GRAM STAIN: Culture: NO GROWTH

## 2022-02-10 LAB — BASIC METABOLIC PANEL
Anion gap: 13 (ref 5–15)
BUN: 69 mg/dL — ABNORMAL HIGH (ref 8–23)
CO2: 29 mmol/L (ref 22–32)
Calcium: 9.3 mg/dL (ref 8.9–10.3)
Chloride: 95 mmol/L — ABNORMAL LOW (ref 98–111)
Creatinine, Ser: 2.88 mg/dL — ABNORMAL HIGH (ref 0.44–1.00)
GFR, Estimated: 16 mL/min — ABNORMAL LOW (ref >60)
Glucose, Bld: 118 mg/dL — ABNORMAL HIGH (ref 70–99)
Potassium: 2.9 mmol/L — ABNORMAL LOW (ref 3.5–5.1)
Sodium: 137 mmol/L (ref 135–145)

## 2022-02-10 LAB — CBC
HCT: 28.9 % — ABNORMAL LOW (ref 36.0–46.0)
Hemoglobin: 8.5 g/dL — ABNORMAL LOW (ref 12.0–15.0)
MCH: 27.8 pg (ref 26.0–34.0)
MCHC: 29.4 g/dL — ABNORMAL LOW (ref 30.0–36.0)
MCV: 94.4 fL (ref 80.0–100.0)
Platelets: 161 10*3/uL (ref 150–400)
RBC: 3.06 MIL/uL — ABNORMAL LOW (ref 3.87–5.11)
RDW: 15.7 % — ABNORMAL HIGH (ref 11.5–15.5)
WBC: 7.3 10*3/uL (ref 4.0–10.5)
nRBC: 0 % (ref 0.0–0.2)

## 2022-02-10 LAB — SEDIMENTATION RATE: Sed Rate: 86 mm/hr — ABNORMAL HIGH (ref 0–22)

## 2022-02-10 LAB — C-REACTIVE PROTEIN: CRP: 9.6 mg/dL — ABNORMAL HIGH (ref <1.0)

## 2022-02-10 LAB — MAGNESIUM: Magnesium: 1.8 mg/dL (ref 1.7–2.4)

## 2022-02-10 MED ORDER — POTASSIUM CHLORIDE CRYS ER 20 MEQ PO TBCR
40.0000 meq | EXTENDED_RELEASE_TABLET | Freq: Once | ORAL | Status: AC
  Administered 2022-02-10: 40 meq via ORAL
  Filled 2022-02-10: qty 2

## 2022-02-10 MED ORDER — POTASSIUM CHLORIDE CRYS ER 20 MEQ PO TBCR: 20.0000 meq | EXTENDED_RELEASE_TABLET | Freq: Once | ORAL | Status: DC

## 2022-02-10 MED ORDER — FUROSEMIDE 20 MG PO TABS
20.0000 mg | ORAL_TABLET | Freq: Once | ORAL | Status: AC
  Administered 2022-02-10: 20 mg via ORAL
  Filled 2022-02-10: qty 1

## 2022-02-10 NOTE — Consult Note (Signed)
ORTHOPAEDIC CONSULTATION  REQUESTING PHYSICIAN: Elmarie Shiley, MD  Chief Complaint: Left hip fracture nonunion  HPI: [name] is a 77 y.o. female who has a history of a left intertrochanteric femur fracture underwent ORIF with soft medullary nail by Dr. Reece Packer and in February 2023.  Postop course was complicated by kidney failure and she was in the ICU for a period of time.  Otherwise did well was able to return to a ambulatory state with a walker and was fairly independent.  She reports on December 17 her left leg gave out and she had a fall and since then has been unable to weight-bear.  Initial imaging was negative however the pain worsened and on December 26 repeat x-rays showed fixation failure of her fracture.  Workup in the hospital including aspiration of the left hip so far has had no growth to date.  She denies pain in other joints or extremities.  She is nervous about undergoing surgery given the complicated postop course she had after her last surgery.  Also spoke to her son Laverna Peace who expressed the same concerns.  Past Medical History:  Diagnosis Date   AICD (automatic cardioverter/defibrillator) present    downgraded to CRT-P in 2014   Anemia    Arthritis    Asthma    Atrial fibrillation ([place]) 10/24/2015   Questionable history of A Fib/A Flutter. On device interrogation on 9/7, showed 0.75mn of A Fib/A Flutter with 1% burden.  Device check in Jan 2018 showed no sustained AF (runs of less than 30 seconds). No anticoagulation indicated.   CAD (coronary artery disease)    a. 10/09/16 LHC: SVG->LAD patent, SVG->Diag patent, SVG->RCA patent, SVG->LCx occluded. EF 60%, b. 10/31/16 LHC DES to AV groove Circ, DES to intermed branch   Chronic lower back pain    Fatty liver disease, nonalcoholic    GERD (gastroesophageal reflux disease)    Gout    H/O hiatal hernia    High cholesterol    Hyperplastic colon polyp    Hypertension    IBS (irritable bowel syndrome)    Internal  hemorrhoids    INTERNAL HEMORRHOIDS WITHOUT MENTION COMP 04/12/2007   Qualifier: Diagnosis of  By: BOlevia PerchesMD, Dora M    Nonischemic cardiomyopathy ([facility] 11/22/2011   Pt responded to BiV ICD- last EF 55-60% Sept 2017   Obesity    OSA (obstructive sleep apnea)    "can't tolerate a mask", wears 2L nocturnal O2   Presence of permanent cardiac pacemaker    11/02/12 [employer] V273 INTUA PPM   [facility]   Type II diabetes mellitus ([facility]    Past Surgical History:  Procedure Laterality Date   ABDOMINAL ULTRASOUND  12/01/2011   Peripelvic cysts- #1- 2.4x1.9x2.3cm, #2-1.2x0.9x1.2cm   ANKLE FRACTURE SURGERY Right    "had rod put in"   ANTERIOR CERVICAL DECOMP/DISCECTOMY FUSION     APPENDECTOMY     BACK SURGERY     BIOPSY  12/29/2017   Procedure: BIOPSY;  Surgeon: NMauri Pole MD;  [facility];  Service: Endoscopy;;   BIV ICD INSERTION CRT-D  2001?   BIV PACEMAKER GENERATOR CHANGE OUT N/A 11/02/2012   Procedure: BIV PACEMAKER GENERATOR CHANGE OUT;  Surgeon: MSanda Klein MD;  [facility] [place];  Service: Cardiovascular;  Laterality: N/A;   CARDIAC CATHETERIZATION  05/17/1999   No significant coronary obstructive disease w/ mild 20% luminal irregularity of the first diag branch of the LAD   CARDIAC CATHETERIZATION  07/08/2002  No significant CAD, moderately depressed LV systolic function   CARDIAC CATHETERIZATION Bilateral 04/26/2007   Normal findings, recommend medical therapy   CARDIAC CATHETERIZATION  02/18/2008   Moderate CAD, would benefit from having a functional study, recommend continue medical therapy   CARDIAC CATHETERIZATION  07/23/2012   Medical therapy   CARDIAC CATHETERIZATION N/A 11/24/2014   Procedure: Left Heart Cath and Coronary Angiography;  Surgeon: Troy Sine, MD;  Location: [place] CV LAB;  Service: Cardiovascular;  Laterality: N/A;   CARDIAC CATHETERIZATION  11/27/2014   Procedure: Intravascular Pressure Wire/FFR Study;   Surgeon: Peter M Martinique, MD;  Location: [place] CV LAB;  Service: Cardiovascular;;   CARDIAC CATHETERIZATION  10/09/2016   CHOLECYSTECTOMY N/A 04/09/2018   Procedure: LAPAROSCOPIC CHOLECYSTECTOMY;  Surgeon: Greer Pickerel, MD;  [facility];  Service: General;  Laterality: N/A;   COLONOSCOPY WITH PROPOFOL N/A 12/29/2017   Procedure: COLONOSCOPY WITH PROPOFOL;  Surgeon: [name], MD;  [facility];  Service: Endoscopy;  Laterality: N/A;   CORONARY ANGIOGRAM  2010   CORONARY ARTERY BYPASS GRAFT N/A 11/29/2014   Procedure: CORONARY ARTERY BYPASS GRAFTING x 5 (LIMA-LAD, SVG-D, SVG-OM1-OM2, SVG-PD);  Surgeon: Melrose Nakayama, MD;  Location: [place];  Service: Open Heart Surgery;  Laterality: N/A;   CORONARY STENT INTERVENTION N/A 10/31/2016   Procedure: CORONARY STENT INTERVENTION;  Surgeon: Burnell Blanks, MD;  Location: [place] CV LAB;  Service: Cardiovascular;  Laterality: N/A;   ESOPHAGOGASTRODUODENOSCOPY (EGD) WITH PROPOFOL N/A 12/29/2017   Procedure: ESOPHAGOGASTRODUODENOSCOPY (EGD) WITH PROPOFOL;  Surgeon: [name], MD;  [facility];  Service: Endoscopy;  Laterality: N/A;   FRACTURE SURGERY     INSERT / REPLACE / REMOVE PACEMAKER  1999   INTRAMEDULLARY (IM) NAIL INTERTROCHANTERIC Left 04/04/2021   Procedure: INTRAMEDULLARY (IM) NAIL INTERTROCHANTRIC;  Surgeon: Vanetta Mulders, MD;  Location: [place];  Service: Orthopedics;  Laterality: Left;   IR FLUORO GUIDE CV LINE LEFT  04/11/2021   IR US GUIDE VASC ACCESS LEFT  04/11/2021   KNEE ARTHROSCOPY Bilateral    LEFT HEART CATH AND CORS/GRAFTS ANGIOGRAPHY N/A 12/09/2017   Procedure: LEFT HEART CATH AND CORS/GRAFTS ANGIOGRAPHY;  Surgeon: Troy Sine, MD;  Location: [place] CV LAB;  Service: Cardiovascular;  Laterality: N/A;   LEFT HEART CATHETERIZATION WITH CORONARY ANGIOGRAM N/A 07/23/2012   Procedure: LEFT HEART CATHETERIZATION WITH CORONARY ANGIOGRAM;  Surgeon: Leonie Man, MD;   Location: [facility];  Service: Cardiovascular;  Laterality: N/A;   LEXISCAN MYOVIEW  11/14/2011   Mild-moderate defect seen in Mid Inferolateral and Mid Anterolateral regions-consistant w/ infarct/scar. No significant ischemia demonstrated.   LUMBAR PERCUTANEOUS PEDICLE SCREW 4 LEVEL N/A 09/10/2021   Procedure: THORACIC SEVEN THROUGH THORACIC ELEVEN  PERCUTANEOUS PEDICLE SCREW PLACEMENT;  Surgeon: Judith Part, MD;  Location: [place];  Service: Neurosurgery;  Laterality: N/A;   POLYPECTOMY  12/29/2017   Procedure: POLYPECTOMY;  Surgeon: [name], MD;  [facility];  Service: Endoscopy;;   PPM GENERATOR CHANGEOUT N/A 12/31/2020   Procedure: PPM GENERATOR CHANGEOUT;  Surgeon: Sanda Klein, MD;  Location: [place] [facility] LAB;  Service: Cardiovascular;  Laterality: N/A;   RIGHT/LEFT HEART CATH AND CORONARY ANGIOGRAPHY N/A 10/09/2016   Procedure: RIGHT/LEFT HEART CATH AND CORONARY ANGIOGRAPHY;  Surgeon: Jolaine Artist, MD;  Location: [place] [facility];  Service: Cardiovascular;  Laterality: N/A;   RIGHT/LEFT HEART CATH AND CORONARY/GRAFT ANGIOGRAPHY N/A 03/11/2019   Procedure: RIGHT/LEFT HEART CATH AND CORONARY/GRAFT ANGIOGRAPHY;  Surgeon:  Bensimhon, Shaune Pascal, MD;  Location: [place] [facility] LAB;  Service: Cardiovascular;  Laterality: N/A;   TEE WITHOUT CARDIOVERSION N/A 11/29/2014   Procedure: TRANSESOPHAGEAL ECHOCARDIOGRAM (TEE);  Surgeon: Melrose Nakayama, MD;  Location: [place];  Service: Open Heart Surgery;  Laterality: N/A;   TRANSCAROTID ARTERY REVASCULARIZATION  Right 10/01/2020   Procedure: RIGHT TRANSCAROTID ARTERY REVASCULARIZATION;  Surgeon: [name], MD;  Location: [place];  Service: Vascular;  Laterality: Right;   TRANSTHORACIC ECHOCARDIOGRAM  07/23/2012   EF 55-60%, normal-mild   TUBAL LIGATION     ULTRASOUND GUIDANCE FOR VASCULAR ACCESS Left 10/01/2020   Procedure: ULTRASOUND GUIDANCE FOR VASCULAR ACCESS;  Surgeon: [name], MD;   Location: [place];  Service: Vascular;  Laterality: Left;   Social History   Socioeconomic History   Marital status: Widowed    Spouse name: Paighton Godette- deceased   Number of children: 5   Years of education: Not on file   Highest education level: Not on file  Occupational History   Occupation: STAFF/BUFFET    Employer: [employer]  Tobacco Use   Smoking status: Former    Packs/day: 0.25    Years: 3.00    Total pack years: 0.75    Types: Cigarettes    Quit date: 02/11/1968    Years since quitting: 54.0   Smokeless tobacco: Never  Vaping Use   Vaping Use: Never used  Substance and Sexual Activity   Alcohol use: No   Drug use: No   Sexual activity: Never  Other Topics Concern   Not on file  Social History Narrative   Widowed last year. Spouse Mike Gip Sr (38) died on date 01/24/2012 04-12-2022) at his home Was a [place] [employer] -[place] [employer]   She lives with her two sons, Thayer Ohm    Other sons Haze Rushing   Social Determinants of Health   Financial Resource Strain: High Risk (12/18/2016)   Overall Financial Resource Strain (CARDIA)    Difficulty of Paying Living Expenses: Very hard  Food Insecurity: No Food Insecurity (02/06/2022)   Hunger Vital Sign    Worried About Running Out of Food in the Last Year: Never true    [place] in the Last Year: Never true  Recent Concern: Food Insecurity - Food Insecurity Present (11/19/2021)   Hunger Vital Sign    Worried About Running Out of Food in the Last Year: Sometimes true    Ran Out of Food in the Last Year: Sometimes true  Transportation Needs: Unmet Transportation Needs (02/06/2022)   PRAPARE - Hydrologist (Medical): Yes    Lack of Transportation (Non-Medical): No  Physical Activity: Inactive (12/18/2016)   Exercise Vital Sign    Days of Exercise per Week: 0 days    Minutes of Exercise per Session: 0 min  Stress: Stress Concern Present  (12/18/2016)   [place]    Feeling of Stress : Very much  Social Connections: Moderately Integrated (02/05/2021)   Social Connection and Isolation Panel [NHANES]    Frequency of Communication with Friends and Family: Three times a week    Frequency of Social Gatherings with Friends and Family: Three times a week    Attends Religious Services: 1 to 4 times per year    Active Member of Clubs or Organizations: Yes    Attends Archivist Meetings: 1 to 4 times per  year    Marital Status: Widowed   Family History  Problem Relation Age of Onset   Breast cancer Mother    Diabetes Mother    Asthma Sister    Heart disease Maternal Grandmother    Kidney disease Maternal Grandmother    Diabetes Maternal Grandmother    Glaucoma Maternal Aunt    Heart disease Maternal Aunt    Colon cancer Neg Hx    Stomach cancer Neg Hx    Pancreatic cancer Neg Hx    Allergies  Allergen Reactions   Ivp Dye [Iodinated Contrast Media] Shortness Of Breath    No reaction to PO contrast with non-ionic dye.06-25-2014/rsm   Shellfish Allergy Anaphylaxis   Sulfa Antibiotics Shortness Of Breath   Iodine Hives   Atorvastatin Other (See Comments)    Pt states "causes bilateral leg pain/cramps."   Benadryl [Diphenhydramine] Other (See Comments)    "THIS DRIVES ME CRAZY AND MAKES ME FEEL LIKE I AM DYING"    Colchicine Nausea And Vomiting   Doxycycline Other (See Comments)    Unknown reaction, per patient   Uloric [Febuxostat] Other (See Comments)    Unknown reaction   Cephalexin Itching and Rash   Zithromax [Azithromycin] Rash     Positive ROS: All other systems have been reviewed and were otherwise negative with the exception of those mentioned in the HPI and as above.  Physical Exam: General: Alert, no acute distress Cardiovascular: No pedal edema Respiratory: No cyanosis, no use of accessory musculature Skin: No lesions in the  area of chief complaint Neurologic: Sensation intact distally Psychiatric: Patient is competent for consent with normal mood and affect  MUSCULOSKELETAL:   LLE   Healed incisions about her left hip without erythema, mild swelling No traumatic wounds, ecchymosis, or rash  Nontender  No knee or ankle effusion  Sens DPN, SPN, TN intact  Motor EHL, ext, flex 5/5  DP 2+, moderate edema  LLE No traumatic wounds, ecchymosis, or rash  Nontender  No groin pain with log roll  No knee or ankle effusion  Sens DPN, SPN, TN intact  Motor EHL, ext, flex 5/5  DP 2+, moderate edema   IMAGING: X-rays and CT scan left femur left hip were reviewed demonstrate nonunion and fixation failure of the left hip cephallomedullary screw with prominence of the screw now into the medial wall of the pelvis.  Severe osteopenia and erosive changes of the proximal femur.  Assessment: Principal Problem:   Pain from implanted hardware Active Problems:   HTN (hypertension), benign   Gastritis without bleeding   Cardiomyopathy (HCC)   OSA (obstructive sleep apnea)   Mixed hypercholesterolemia and hypertriglyceridemia   CAD S/P percutaneous coronary angioplasty   Chronic diastolic CHF (congestive heart failure) (HCC)   CHB (complete heart block) (HCC)   Atrial fibrillation (HCC)   ESRD (end stage renal disease) (HCC)   Diabetes mellitus without complication (HCC)   Thrombocytopenia (HCC)   Irritable bowel syndrome with constipation   Congestive heart failure (CHF) (HCC)   Supplemental oxygen dependent   Hypertension   CHF (congestive heart failure) (HCC)   Diabetic renal disease ([place])   Pure hypercholesterolemia   Physical deconditioning   Chronic hypoxemic respiratory failure (HCC)   Pressure injury of back, stage 2 (HCC)  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  At  this point the screw fixing  her femur has gone through the medial wall of the pelvis.  Discussed etiologies of nonunion including infection.  High suspicion for this given the severe erosive changes of the proximal femur.  So far aspiration of the left hip from 2 days ago has shown no growth to date.  However, inflammatory markers are elevated with an elevated CRP and ESR today. Discussed plan for IntraOp assessment as well.  Discussed that at this point requires removal of the cephallomedullary nail with conversion to a total hip arthroplasty.  If the aspiration cultures turn positive before Wednesday, or if there is concern for infection intraoperatively we will plan for an antibiotic spacer, in which case she would need a revision back to a total hip arthroplasty in approximately 3 to 6 months once we feel the infection has been eradicated.  Starting hemoglobin is very low, today 8.5.  Anticipate she will need a blood transfusion perioperatively.  Patient and family are aware.  The risks benefits and alternatives were discussed with the patient including but not limited to the risks of nonoperative treatment, versus surgical intervention including infection, bleeding, nerve injury, malunion, nonunion, the need for revision surgery, hardware prominence, hardware failure, the need for hardware removal, blood clots, cardiopulmonary complications, morbidity, mortality, among others, and they were willing to proceed.    Willaim Sheng, MD  Contact information:   [facility] 7am-5pm epic message Dr. Zachery Dakins, or call office for patient follow up: [phone] After hours and holidays please check [email or web address] for group call information for [facility]

## 2022-02-10 NOTE — Care Management Important Message (Signed)
Important Message  Patient Details  Name: [name] MRN: [id number] Date of Birth: [birth year]   Medicare Important Message Given:  Yes     Hannah Beat 02/10/2022, 2:00 PM

## 2022-02-10 NOTE — Progress Notes (Signed)
PROGRESS NOTE    [name]  ZDG:[id number] DOB: [birth year] DOA: 02/06/2022 PCP: Lujean Amel, MD   Brief Narrative: 77 y.o. female with medical history significant of  CAD s/p CABG '16, HFpEF, Obesity, Adrenal insufficiency, Diabetes, HTN CKD stage III, OSA/obesity-hypoventilation syndrome on 3 L O2 chronically, complete heart block s/p PPM/ICD.  In 2/23 patient had a left hip and left superior pubic ramus fracture.  In 8/23 follow-up imaging showed evidence of atrophic nonunion.  After further discussion revision nailing was thought not to be an optimal solution at the time.  Since day after Christmas she developed right hip pain.  Resolved but she could not walk.  She will need revision hip surgery to a total hip arthroplasty.     Imagine showed cephalomedullary screw migration of the intertrochanteric nailing status post fall 2 days prior to admission.  Assessment & Plan:   Principal Problem:   Pain from implanted hardware Active Problems:   Cardiomyopathy (HCC)   OSA (obstructive sleep apnea)   Thrombocytopenia (HCC)   HTN (hypertension), benign   Gastritis without bleeding   Mixed hypercholesterolemia and hypertriglyceridemia   CAD S/P percutaneous coronary angioplasty   Chronic diastolic CHF (congestive heart failure) (HCC)   CHB (complete heart block) (HCC)   Atrial fibrillation (HCC)   ESRD (end stage renal disease) (HCC)   Diabetes mellitus without complication (HCC)   Irritable bowel syndrome with constipation   Congestive heart failure (CHF) (HCC)   Supplemental oxygen dependent   Hypertension   CHF (congestive heart failure) (HCC)   Diabetic renal disease ([place])   Pure hypercholesterolemia   Physical deconditioning   Chronic hypoxemic respiratory failure (HCC)   Pressure injury of back, stage 2 (HCC)  1-Right hip pain/migration of hardware; -Ortho recommended VIR aspiration of the left hip with cultures. -Failure consultation with Dr. Zachery Dakins for  definitive surgical repair.  -Culture no growth to date.  -Needs 72 hours off Plavix.  -Plan for Sx 1/03.  Chronic hypoxemic respiratory failure (HCC) COPD/asthma -Stable, continue with Symbicort  nebulizers as needed -Stable on chronic 3 L oxygen.  Had some SOB last night, chest x ray mild pulmonary edema. Will give extra dose fo lasix.   OSA (obstructive sleep apnea) -Does not use CPAP   Physical deconditioning -PT after surgery   HTN (hypertension), benign -Stable, continue with  Imdur    Hypokalemia -40 meq time time 2.     Gout -Allopurinol resumed   Chronic diastolic and systolic EF 59-56 % CHF (congestive heart failure) (HCC)/CAD -Stable, Continue with  Imdur, Crestor -avoid hypotension -Plavix and aspirin on hold   CHB (complete heart block) (HCC)/Atrial fibrillation (HCC) -PPM in place   GERD -Stable, resume sucralfate and Protonix -As needed simethicone resumed    CKD 4 (HCC) -Stable, GFR 13 -in Feb her postoperative course was complicated by renal failure with acute need for hemodialysis.   -avoid hypotension -may need renal consult at any sign of worsening kidney function  -Monitor on lasix.  Cr stable.   Anemia; check anemia panel. Suspect of chronic diseases.  Started  ferrous sulfate and folic acid.    Pure hypercholesterolemia -Stable, Crestor resumed Constipation; suppository.   UTI; report dysuria, UA with more tha 50 WBC. Start Unasyn. Follow urine culture. Growing klebsiella Pneumoniae.   Pressure Injury 04/13/21 Face Left;Upper Stage 1 -  Intact skin with non-blanchable redness of a localized area usually over a bony prominence. 1x1 from bipap mask (Active)  04/13/21 1200  Location: Face  Location Orientation: Left;Upper  Staging: Stage 1 -  Intact skin with non-blanchable redness of a localized area usually over a bony prominence.  Wound Description (Comments): 1x1 from bipap mask  Present on Admission:      Pressure Injury 04/13/21  Sacrum Mid Deep Tissue Pressure Injury - Purple or maroon localized area of discolored intact skin or blood-filled blister due to damage of underlying soft tissue from pressure and/or shear. Dark purple, not blancheable- possibly (Active)  04/13/21 2000  Location: Sacrum  Location Orientation: Mid  Staging: Deep Tissue Pressure Injury - Purple or maroon localized area of discolored intact skin or blood-filled blister due to damage of underlying soft tissue from pressure and/or shear.  Wound Description (Comments): Dark purple, not blancheable- possibly old pressure injury with DTI  Present on Admission:      Pressure Injury 11/01/21 Sacrum Medial Stage 2 -  Partial thickness loss of dermis presenting as a shallow open injury with a red, pink wound bed without slough. fissure and associated area of superficial skin breakdown over sacrum (Active)  11/01/21 1434  Location: Sacrum  Location Orientation: Medial  Staging: Stage 2 -  Partial thickness loss of dermis presenting as a shallow open injury with a red, pink wound bed without slough.  Wound Description (Comments): fissure and associated area of superficial skin breakdown over sacrum  Present on Admission: Yes     Estimated body mass index is 35.11 kg/m as calculated from the following:   Height as of this encounter: '5\' 3"'$  (1.6 m).   Weight as of this encounter: 89.9 kg.   DVT prophylaxis: SCD Code Status: Full code Family Communication: son [name] over phone 12/30.  Disposition Plan:  Status is: Inpatient Remains inpatient appropriate because: management of hip hardware migration.     Consultants:  ortho  Procedures:    Antimicrobials:    Subjective: She report still having mild dyspnea.     Objective: Vitals:   02/09/22 1600 02/09/22 1727 02/09/22 2024 02/09/22 2049  BP: 135/60 (!) 119/56  120/68  Pulse:  88  85  Resp: 16   16  Temp: 98.4 F (36.9 C)   98.6 F (37 C)  TempSrc: Oral   Oral  SpO2: 97% 100% 94%  98%  Weight:      Height:        Intake/Output Summary (Last 24 hours) at 02/10/2022 0830 Last data filed at 02/10/2022 0434 Gross per 24 hour  Intake 200 ml  Output 4006 ml  Net -3806 ml    Filed Weights   02/06/22 1435 02/06/22 2332  Weight: 85.7 kg 89.9 kg    Examination:  General exam: NAD Respiratory system:  CTA Cardiovascular system: S 1, S 2 RRR Gastrointestinal system: BS present, NT Central nervous system:ALert Extremities: no edema    Data Reviewed: I have personally reviewed following labs and imaging studies  CBC: Recent Labs  Lab 02/06/22 1518 02/07/22 0323 02/08/22 0247 02/09/22 0236 02/10/22 0257  WBC 8.2 7.3 7.1 6.1 7.3  NEUTROABS 6.5 5.1  --   --   --   HGB 9.6* 8.6* 7.8* 8.2* 8.5*  HCT 31.8* 29.4* 26.8* 28.2* 28.9*  MCV 95.5 97.0 96.4 96.9 94.4  PLT 183 163 174 150 756    Basic Metabolic Panel: Recent Labs  Lab 02/06/22 1518 02/07/22 0323 02/08/22 0247 02/09/22 0236 02/10/22 0257  NA 139 140 139 140 137  K 4.0 3.4* 3.6 3.0* 2.9*  CL 99 101 101 99 95*  CO2  $'29 26 27 29 29  'e$ GLUCOSE 119* 103* 111* 119* 118*  BUN 76* 71* 70* 70* 69*  CREATININE 3.46* 3.25* 3.00* 3.03* 2.88*  CALCIUM 9.8 9.2 8.9 9.2 9.3    GFR: Estimated Creatinine Clearance: 17.4 mL/min (A) (by C-G formula based on SCr of 2.88 mg/dL (H)). Liver Function Tests: No results for input(s): "AST", "ALT", "ALKPHOS", "BILITOT", "PROT", "ALBUMIN" in the last 168 hours. No results for input(s): "LIPASE", "AMYLASE" in the last 168 hours. No results for input(s): "AMMONIA" in the last 168 hours. Coagulation Profile: No results for input(s): "INR", "PROTIME" in the last 168 hours. Cardiac Enzymes: No results for input(s): "CKTOTAL", "CKMB", "CKMBINDEX", "TROPONINI" in the last 168 hours. BNP (last 3 results) No results for input(s): "PROBNP" in the last 8760 hours. HbA1C: No results for input(s): "HGBA1C" in the last 72 hours. CBG: No results for input(s): "GLUCAP" in the  last 168 hours. Lipid Profile: No results for input(s): "CHOL", "HDL", "LDLCALC", "TRIG", "CHOLHDL", "LDLDIRECT" in the last 72 hours. Thyroid Function Tests: No results for input(s): "TSH", "T4TOTAL", "FREET4", "T3FREE", "THYROIDAB" in the last 72 hours. Anemia Panel: Recent Labs    02/09/22 0236  VITAMINB12 1,009*  FOLATE 9.1  FERRITIN 838*  TIBC 186*  IRON 29  RETICCTPCT 2.9    Sepsis Labs: No results for input(s): "PROCALCITON", "LATICACIDVEN" in the last 168 hours.  Recent Results (from the past 240 hour(s))  Body fluid culture w Gram Stain     Status: None   Collection Time: 02/07/22  3:01 PM   Specimen: PATH Cytology Misc. fluid; Body Fluid  Result Value Ref Range Status   Specimen Description FLUID LEFT HIP  Final   Special Requests NONE  Final   Gram Stain   Final    FEW WBC PRESENT, PREDOMINANTLY MONONUCLEAR NO ORGANISMS SEEN    Culture   Final    NO GROWTH 3 DAYS Performed at [facility], [address], [place], [place] [address]    Report Status 02/10/2022 FINAL  Final         Radiology Studies: DG Chest 1 View  Result Date: 02/09/2022 CLINICAL DATA:  Shortness of breath. EXAM: CHEST  1 VIEW COMPARISON:  01/26/2022 and prior radiographs FINDINGS: Cardiomegaly, CABG changes and RIGHT pacemaker/AICD again noted. Mild pulmonary vascular congestion noted. Mild LEFT basilar scarring/atelectasis again noted. There is no evidence of focal airspace disease, pulmonary edema, suspicious pulmonary nodule/mass, pleural effusion, or pneumothorax. No acute bony abnormalities are identified. Thoracic spinal fixation hardware noted. IMPRESSION: Cardiomegaly with mild pulmonary vascular congestion. Electronically Signed   By: Margarette Canada M.D.   On: 02/09/2022 18:30        Scheduled Meds:  allopurinol  100 mg Oral Daily   cycloSPORINE  1 drop Both Eyes Daily   enoxaparin (LOVENOX) injection  30 mg Subcutaneous Q24H   ferrous sulfate  325 mg Oral Q breakfast    folic acid  1 mg Oral Daily   furosemide  20 mg Oral Once   furosemide  80 mg Oral BID   isosorbide mononitrate  90 mg Oral Daily   linaclotide  290 mcg Oral q morning   mometasone-formoterol  2 puff Inhalation BID   pantoprazole  40 mg Oral BID   polyethylene glycol  17 g Oral BID   potassium chloride  40 mEq Oral Once   rosuvastatin  20 mg Oral QPM   senna-docusate  1 tablet Oral BID   sucralfate  1 g Oral TID WC &  HS   triamcinolone cream   Topical BID   Continuous Infusions:  ampicillin-sulbactam (UNASYN) IV Stopped (02/10/22 0019)     LOS: 4 days    Time spent: 35 minutes.     Elmarie Shiley, MD [facility]   If 7PM-7AM, please contact night-coverage [email or web address]  02/10/2022, 8:30 AM

## 2022-02-11 ENCOUNTER — Other Ambulatory Visit: Payer: Self-pay | Admitting: Physician Assistant

## 2022-02-11 ENCOUNTER — Encounter (HOSPITAL_COMMUNITY): Payer: Medicare Other

## 2022-02-11 DIAGNOSIS — M25552 Pain in left hip: Secondary | ICD-10-CM | POA: Diagnosis not present

## 2022-02-11 LAB — BASIC METABOLIC PANEL
Anion gap: 12 (ref 5–15)
BUN: 62 mg/dL — ABNORMAL HIGH (ref 8–23)
CO2: 28 mmol/L (ref 22–32)
Calcium: 9.4 mg/dL (ref 8.9–10.3)
Chloride: 98 mmol/L (ref 98–111)
Creatinine, Ser: 2.54 mg/dL — ABNORMAL HIGH (ref 0.44–1.00)
GFR, Estimated: 19 mL/min — ABNORMAL LOW (ref >60)
Glucose, Bld: 115 mg/dL — ABNORMAL HIGH (ref 70–99)
Potassium: 3.3 mmol/L — ABNORMAL LOW (ref 3.5–5.1)
Sodium: 138 mmol/L (ref 135–145)

## 2022-02-11 LAB — CBC
HCT: 28.7 % — ABNORMAL LOW (ref 36.0–46.0)
Hemoglobin: 9.1 g/dL — ABNORMAL LOW (ref 12.0–15.0)
MCH: 29.4 pg (ref 26.0–34.0)
MCHC: 31.7 g/dL (ref 30.0–36.0)
MCV: 92.9 fL (ref 80.0–100.0)
Platelets: 173 10*3/uL (ref 150–400)
RBC: 3.09 MIL/uL — ABNORMAL LOW (ref 3.87–5.11)
RDW: 15.5 % (ref 11.5–15.5)
WBC: 7.7 10*3/uL (ref 4.0–10.5)
nRBC: 0 % (ref 0.0–0.2)

## 2022-02-11 LAB — URINE CULTURE: Culture: >=100000 — AB

## 2022-02-11 LAB — GLUCOSE, CAPILLARY
Glucose-Capillary: 113 mg/dL — ABNORMAL HIGH (ref 70–99)
Glucose-Capillary: 155 mg/dL — ABNORMAL HIGH (ref 70–99)

## 2022-02-11 MED ORDER — HYDROMORPHONE HCL 1 MG/ML IJ SOLN: 1.0000 mg | INTRAMUSCULAR | Status: DC | PRN

## 2022-02-11 MED ORDER — FUROSEMIDE 40 MG PO TABS
80.0000 mg | ORAL_TABLET | Freq: Once | ORAL | Status: AC
  Administered 2022-02-11: 80 mg via ORAL
  Filled 2022-02-11 (×2): qty 2

## 2022-02-11 MED ORDER — POTASSIUM CHLORIDE CRYS ER 20 MEQ PO TBCR
40.0000 meq | EXTENDED_RELEASE_TABLET | ORAL | Status: AC
  Administered 2022-02-11 (×2): 40 meq via ORAL
  Filled 2022-02-11 (×2): qty 2

## 2022-02-11 MED ORDER — VANCOMYCIN HCL IN DEXTROSE 1-5 GM/200ML-% IV SOLN
1000.0000 mg | INTRAVENOUS | Status: AC
  Administered 2022-02-12: 1000 mg via INTRAVENOUS
  Filled 2022-02-11: qty 200

## 2022-02-11 MED ORDER — CHLORHEXIDINE GLUCONATE 4 % EX LIQD: 60.0000 mL | Freq: Once | CUTANEOUS | Status: DC

## 2022-02-11 MED ORDER — POVIDONE-IODINE 10 % EX SWAB: 2.0000 | Freq: Once | CUTANEOUS | Status: DC

## 2022-02-11 MED ORDER — TRANEXAMIC ACID-NACL 1000-0.7 MG/100ML-% IV SOLN
1000.0000 mg | INTRAVENOUS | Status: DC
  Filled 2022-02-11: qty 100

## 2022-02-11 MED ORDER — HYDROMORPHONE HCL 1 MG/ML IJ SOLN
0.5000 mg | INTRAMUSCULAR | Status: DC | PRN
  Administered 2022-02-11: 1 mg via INTRAVENOUS
  Administered 2022-02-11: 0.5 mg via INTRAVENOUS
  Administered 2022-02-12 – 2022-02-13 (×4): 1 mg via INTRAVENOUS
  Filled 2022-02-11 (×7): qty 1

## 2022-02-11 MED ORDER — CALCIUM CARBONATE ANTACID 500 MG PO CHEW
1.0000 | CHEWABLE_TABLET | Freq: Two times a day (BID) | ORAL | Status: DC | PRN
  Administered 2022-02-11 – 2022-02-13 (×2): 200 mg via ORAL
  Filled 2022-02-11 (×3): qty 1

## 2022-02-11 MED ORDER — CEFAZOLIN SODIUM-DEXTROSE 2-4 GM/100ML-% IV SOLN: 2.0000 g | INTRAVENOUS | Status: DC

## 2022-02-11 NOTE — Progress Notes (Addendum)
PROGRESS NOTE    [name]  JOA:[id number] DOB: 12/03/1945 DOA: 02/06/2022 PCP: Alicia Amel, MD   Brief Narrative: 77 y.o. female with medical history significant of  CAD s/p CABG '16, HFpEF, Obesity, Adrenal insufficiency, Diabetes, HTN CKD stage III, OSA/obesity-hypoventilation syndrome on 3 L O2 chronically, complete heart block s/p PPM/ICD.  In 2/23 patient had a left hip and left superior pubic ramus fracture.  In 8/23 follow-up imaging showed evidence of atrophic nonunion.  After further discussion revision nailing was thought not to be an optimal solution at the time.  Since day after Christmas she developed right hip pain.  Resolved but she could not walk.  She will need revision hip surgery to a total hip arthroplasty.     Imagine showed cephalomedullary screw migration of the intertrochanteric nailing status post fall 2 days prior to admission.  Plan for Surgery tomorrow. Nephrology consulted to assist with CKD management post op.    Assessment & Plan:   Principal Problem:   Pain from implanted hardware Active Problems:   Cardiomyopathy (HCC)   OSA (obstructive sleep apnea)   Thrombocytopenia (HCC)   HTN (hypertension), benign   Gastritis without bleeding   Mixed hypercholesterolemia and hypertriglyceridemia   CAD S/P percutaneous coronary angioplasty   Chronic diastolic CHF (congestive heart failure) (HCC)   CHB (complete heart block) (HCC)   Atrial fibrillation (HCC)   ESRD (end stage renal disease) (HCC)   Diabetes mellitus without complication (HCC)   Irritable bowel syndrome with constipation   Congestive heart failure (CHF) (HCC)   Supplemental oxygen dependent   Hypertension   CHF (congestive heart failure) (HCC)   Diabetic renal disease ([place])   Pure hypercholesterolemia   Physical deconditioning   Chronic hypoxemic respiratory failure (HCC)   Pressure injury of back, stage 2 (HCC)  1-Right hip pain/migration of hardware; -Ortho recommended VIR  aspiration of the left hip with cultures. -Failure consultation with Dr. Zachery [name] for definitive surgical repair.  -Culture no growth to date.  -Needs 72 hours off Plavix.  -Plan for Sx 1/03. -she is at risk for Post operative complication, deterioration. Monitor closely post sx.   Chronic hypoxemic respiratory failure (HCC) COPD/asthma -Stable, continue with Symbicort  nebulizers as needed -Stable on chronic 3 L oxygen.  1/01: Had some SOB last night, chest x ray mild pulmonary edema. Received extra dose of lasix 1/01. Report breathing better, feels is positional from been in bed.    CKD 4 (HCC) -Stable, GFR 13 -in Feb her postoperative course was complicated by renal failure with acute need for hemodialysis.   -avoid hypotension -may need renal consult at any sign of worsening kidney function  -Monitor on lasix.  Cr stable.   OSA (obstructive sleep apnea) -Does not use CPAP   Physical deconditioning -PT after surgery   HTN (hypertension), benign -Stable, continue with  Imdur    Hypokalemia -40 meq time time 2.     Gout -Allopurinol resumed   Chronic diastolic and systolic EF 60-10 % CHF (congestive heart failure) (HCC)/CAD -Stable, Continue with  Imdur, Crestor -avoid hypotension -Plavix and aspirin on hold   CHB (complete heart block) (HCC)/Atrial fibrillation (HCC) -PPM in place   GERD -Stable, resume sucralfate and Protonix -As needed simethicone resumed  DM; Check CBG Anemia;Suspect of chronic diseases.  Started  ferrous sulfate and folic acid.  Anemia Panel: B 12: 9323, folic 9.1, iron 29, ferritin: 838 Hb stable.   Pure hypercholesterolemia -Stable, Crestor resumed Constipation; Received suppository. Had BM.  UTI; Klebsiella Pneumoniae.  Report dysuria, UA with more tha 50 WBC. Continue with  Unasyn, day 2. Follow urine culture. Growing klebsiella Pneumoniae.   Hypokalemia; replete orally.   Pressure Injury 04/13/21 Face Left;Upper Stage 1 -   Intact skin with non-blanchable redness of a localized area usually over a bony prominence. 1x1 from bipap mask (Active)  04/13/21 1200  Location: Face  Location Orientation: Left;Upper  Staging: Stage 1 -  Intact skin with non-blanchable redness of a localized area usually over a bony prominence.  Wound Description (Comments): 1x1 from bipap mask  Present on Admission:      Pressure Injury 04/13/21 Sacrum Mid Deep Tissue Pressure Injury - Purple or maroon localized area of discolored intact skin or blood-filled blister due to damage of underlying soft tissue from pressure and/or shear. Dark purple, not blancheable- possibly (Active)  04/13/21 2000  Location: Sacrum  Location Orientation: Mid  Staging: Deep Tissue Pressure Injury - Purple or maroon localized area of discolored intact skin or blood-filled blister due to damage of underlying soft tissue from pressure and/or shear.  Wound Description (Comments): Dark purple, not blancheable- possibly old pressure injury with DTI  Present on Admission:      Pressure Injury 11/01/21 Sacrum Medial Stage 2 -  Partial thickness loss of dermis presenting as a shallow open injury with a red, pink wound bed without slough. fissure and associated area of superficial skin breakdown over sacrum (Active)  11/01/21 1434  Location: Sacrum  Location Orientation: Medial  Staging: Stage 2 -  Partial thickness loss of dermis presenting as a shallow open injury with a red, pink wound bed without slough.  Wound Description (Comments): fissure and associated area of superficial skin breakdown over sacrum  Present on Admission: Yes     Estimated body mass index is 35.11 kg/m as calculated from the following:   Height as of this encounter: '5\' 3"'$  (1.6 m).   Weight as of this encounter: 89.9 kg.   DVT prophylaxis: SCD Code Status: Full code Family Communication: son [name] over phone 12/30.  Disposition Plan:  Status is: Inpatient Remains inpatient  appropriate because: management of hip hardware migration.     Consultants:  ortho  Procedures:    Antimicrobials:    Subjective: She is breathing better, feels dyspnea is positional from been in bed.  Denies chest pain. Had BM   Objective: Vitals:   02/10/22 2046 02/10/22 2126 02/11/22 0431 02/11/22 0758  BP: (!) 123/57  (!) 144/61 (!) 145/72  Pulse: 77  78 83  Resp: '18  16 18  '$ Temp: 98.9 F (37.2 C)  98.5 F (36.9 C) 99.1 F (37.3 C)  TempSrc:   Oral   SpO2: 99% 99% 99% 98%  Weight:      Height:        Intake/Output Summary (Last 24 hours) at 02/11/2022 0817 Last data filed at 02/11/2022 0515 Gross per 24 hour  Intake --  Output 2600 ml  Net -2600 ml    Filed Weights   02/06/22 1435 02/06/22 2332  Weight: 85.7 kg 89.9 kg    Examination:  General exam: NAD Respiratory system: CTA Cardiovascular system: S 1, S 2  RRR Gastrointestinal system: BS present, soft, nt Central nervous system: Alert, follows command Extremities: BL dressing    Data Reviewed: I have personally reviewed following labs and imaging studies  CBC: Recent Labs  Lab 02/06/22 1518 02/07/22 0323 02/08/22 0247 02/09/22 0236 02/10/22 0257 02/11/22 0732  WBC 8.2 7.3 7.1  6.1 7.3 7.7  NEUTROABS 6.5 5.1  --   --   --   --   HGB 9.6* 8.6* 7.8* 8.2* 8.5* 9.1*  HCT 31.8* 29.4* 26.8* 28.2* 28.9* 28.7*  MCV 95.5 97.0 96.4 96.9 94.4 92.9  PLT 183 163 174 150 161 921    Basic Metabolic Panel: Recent Labs  Lab 02/06/22 1518 02/07/22 0323 02/08/22 0247 02/09/22 0236 02/10/22 0257  NA 139 140 139 140 137  K 4.0 3.4* 3.6 3.0* 2.9*  CL 99 101 101 99 95*  CO2 '29 26 27 29 29  '$ GLUCOSE 119* 103* 111* 119* 118*  BUN 76* 71* 70* 70* 69*  CREATININE 3.46* 3.25* 3.00* 3.03* 2.88*  CALCIUM 9.8 9.2 8.9 9.2 9.3  MG  --   --   --   --  1.8    GFR: Estimated Creatinine Clearance: 17.4 mL/min (A) (by C-G formula based on SCr of 2.88 mg/dL (H)). Liver Function Tests: No results for  input(s): "AST", "ALT", "ALKPHOS", "BILITOT", "PROT", "ALBUMIN" in the last 168 hours. No results for input(s): "LIPASE", "AMYLASE" in the last 168 hours. No results for input(s): "AMMONIA" in the last 168 hours. Coagulation Profile: No results for input(s): "INR", "PROTIME" in the last 168 hours. Cardiac Enzymes: No results for input(s): "CKTOTAL", "CKMB", "CKMBINDEX", "TROPONINI" in the last 168 hours. BNP (last 3 results) No results for input(s): "PROBNP" in the last 8760 hours. HbA1C: No results for input(s): "HGBA1C" in the last 72 hours. CBG: No results for input(s): "GLUCAP" in the last 168 hours. Lipid Profile: No results for input(s): "CHOL", "HDL", "LDLCALC", "TRIG", "CHOLHDL", "LDLDIRECT" in the last 72 hours. Thyroid Function Tests: No results for input(s): "TSH", "T4TOTAL", "FREET4", "T3FREE", "THYROIDAB" in the last 72 hours. Anemia Panel: Recent Labs    02/09/22 0236  VITAMINB12 1,009*  FOLATE 9.1  FERRITIN 838*  TIBC 186*  IRON 29  RETICCTPCT 2.9    Sepsis Labs: No results for input(s): "PROCALCITON", "LATICACIDVEN" in the last 168 hours.  Recent Results (from the past 240 hour(s))  Body fluid culture w Gram Stain     Status: None   Collection Time: 02/07/22  3:01 PM   Specimen: PATH Cytology Misc. fluid; Body Fluid  Result Value Ref Range Status   Specimen Description FLUID LEFT HIP  Final   Special Requests NONE  Final   Gram Stain   Final    FEW WBC PRESENT, PREDOMINANTLY MONONUCLEAR NO ORGANISMS SEEN    Culture   Final    NO GROWTH 3 DAYS Performed at [facility], [address], [place], [place] [address]    Report Status 02/10/2022 FINAL  Final  Urine Culture     Status: Abnormal (Preliminary result)   Collection Time: 02/09/22 11:49 AM   Specimen: Urine, Clean Catch  Result Value Ref Range Status   Specimen Description URINE, CLEAN CATCH  Final   Special Requests NONE  Final   Culture (A)  Final    >=100,000 COLONIES/mL KLEBSIELLA  PNEUMONIAE SUSCEPTIBILITIES TO FOLLOW Performed at [facility], [address], [place], [place] [address]    Report Status PENDING  Incomplete         Radiology Studies: DG Chest 1 View  Result Date: 02/09/2022 CLINICAL DATA:  Shortness of breath. EXAM: CHEST  1 VIEW COMPARISON:  01/26/2022 and prior radiographs FINDINGS: Cardiomegaly, CABG changes and RIGHT pacemaker/AICD again noted. Mild pulmonary vascular congestion noted. Mild LEFT basilar scarring/atelectasis again noted. There is no evidence  of focal airspace disease, pulmonary edema, suspicious pulmonary nodule/mass, pleural effusion, or pneumothorax. No acute bony abnormalities are identified. Thoracic spinal fixation hardware noted. IMPRESSION: Cardiomegaly with mild pulmonary vascular congestion. Electronically Signed   By: Margarette Canada M.D.   On: 02/09/2022 18:30        Scheduled Meds:  allopurinol  100 mg Oral Daily   cycloSPORINE  1 drop Both Eyes Daily   enoxaparin (LOVENOX) injection  30 mg Subcutaneous Q24H   ferrous sulfate  325 mg Oral Q breakfast   folic acid  1 mg Oral Daily   furosemide  80 mg Oral BID   isosorbide mononitrate  90 mg Oral Daily   linaclotide  290 mcg Oral q morning   mometasone-formoterol  2 puff Inhalation BID   pantoprazole  40 mg Oral BID   polyethylene glycol  17 g Oral BID   rosuvastatin  20 mg Oral QPM   senna-docusate  1 tablet Oral BID   sucralfate  1 g Oral TID WC & HS   triamcinolone cream   Topical BID   Continuous Infusions:  ampicillin-sulbactam (UNASYN) IV 3 g (02/10/22 2308)     LOS: 5 days    Time spent: 35 minutes.     Elmarie Shiley, MD [facility]   If 7PM-7AM, please contact night-coverage [email or web address]  02/11/2022, 8:17 AM

## 2022-02-11 NOTE — Consult Note (Addendum)
[facility] Nephrology Consultation Note  Requesting MD: Dr. Niel Hummer Reason for consult: CKD  HPI:  [name] is a 77 y.o. female with a past medical history significant for CAD, CHF, fatty liver, gout, hypertension, HLD, IBS, NICM, obesity, OSA, chronic hypoxic respiratory failure on home oxygen around 3 L, status post pacemaker, stroke, DM 2, CKD 4, plan for hip fracture surgery, consulted [place] for the management of CKD. The patient was admitted in late February 2023 with a left hip fracture which was complicated by AKI on CKD.  The patient required CRRT, IHD.  She was discharged in 04/2021 for outpatient dialysis via [facility] which she took until May.  It seems like she had renal recovery therefore discharged from outpatient dialysis with baseline creatinine level seems to be around 3.  She follows at [facility] with Dr. Joelyn Oms. The creatinine level was around 3.46 during admission however it is trending down to 2.54 today.  The potassium level 3.3, BUN 62, hemoglobin 9.1. She was seen by orthopedics who is planning to do surgical intervention possibly tomorrow. She had around 2.6 L of urine output in last 24 hours.  She takes furosemide 80 mg twice a day. She denies nausea, vomiting, chest pain, shortness of breath.  She is currently on 2 to 3 L of oxygen which is her home dose.  PMHx:   Past Medical History:  Diagnosis Date   AICD (automatic cardioverter/defibrillator) present    downgraded to CRT-P in 2014   Anemia    Arthritis    Asthma    Atrial fibrillation ([place]) 10/24/2015   Questionable history of A Fib/A Flutter. On device interrogation on 9/7, showed 0.67mn of A Fib/A Flutter with 1% burden.  Device check in Jan 2018 showed no sustained AF (runs of less than 30 seconds). No anticoagulation indicated.   CAD (coronary artery disease)    a. 10/09/16 LHC: SVG->LAD patent, SVG->Diag patent, SVG->RCA patent, SVG->LCx occluded. EF 60%, b. 10/31/16 LHC DES  to AV groove Circ, DES to intermed branch   Chronic lower back pain    Fatty liver disease, nonalcoholic    GERD (gastroesophageal reflux disease)    Gout    H/O hiatal hernia    High cholesterol    Hyperplastic colon polyp    Hypertension    IBS (irritable bowel syndrome)    Internal hemorrhoids    INTERNAL HEMORRHOIDS WITHOUT MENTION COMP 04/12/2007   Qualifier: Diagnosis of  By: BOlevia PerchesMD, Dora M    Nonischemic cardiomyopathy ([place] 11/22/2011   Pt responded to BiV ICD- last EF 55-60% Sept 2017   Obesity    OSA (obstructive sleep apnea)    "can't tolerate a mask", wears 2L nocturnal O2   Presence of permanent cardiac pacemaker    11/02/12 [employer] V273 INTUA PPM   Stroke ([facility] 09/2020   Type II diabetes mellitus ([place]     Past Surgical History:  Procedure Laterality Date   ABDOMINAL ULTRASOUND  12/01/2011   Peripelvic cysts- #1- 2.4x1.9x2.3cm, #2-1.2x0.9x1.2cm   ANKLE FRACTURE SURGERY Right    "had rod put in"   ANTERIOR CERVICAL DECOMP/DISCECTOMY FUSION     APPENDECTOMY     BACK SURGERY     BIOPSY  12/29/2017   Procedure: BIOPSY;  Surgeon: NMauri Pole MD;  Location: [facility];  Service: Endoscopy;;   BIV ICD INSERTION CRT-D  2001?   BIV PACEMAKER GENERATOR CHANGE OUT N/A 11/02/2012   Procedure: BIV PACEMAKER GENERATOR CHANGE OUT;  Surgeon: Sanda Klein, MD;  Location: [facility] CATH LAB;  Service: Cardiovascular;  Laterality: N/A;   CARDIAC CATHETERIZATION  05/17/1999   No significant coronary obstructive disease w/ mild 20% luminal irregularity of the first diag branch of the LAD   CARDIAC CATHETERIZATION  07/08/2002   No significant CAD, moderately depressed LV systolic function   CARDIAC CATHETERIZATION Bilateral 04/26/2007   Normal findings, recommend medical therapy   CARDIAC CATHETERIZATION  02/18/2008   Moderate CAD, would benefit from having a functional study, recommend continue medical therapy   CARDIAC CATHETERIZATION  07/23/2012   Medical therapy    CARDIAC CATHETERIZATION N/A 11/24/2014   Procedure: Left Heart Cath and Coronary Angiography;  Surgeon: Troy Sine, MD;  Location: [place] CV LAB;  Service: Cardiovascular;  Laterality: N/A;   CARDIAC CATHETERIZATION  11/27/2014   Procedure: Intravascular Pressure Wire/FFR Study;  Surgeon: Peter M Martinique, MD;  Location: [place] CV LAB;  Service: Cardiovascular;;   CARDIAC CATHETERIZATION  10/09/2016   CHOLECYSTECTOMY N/A 04/09/2018   Procedure: LAPAROSCOPIC CHOLECYSTECTOMY;  Surgeon: Greer Pickerel, MD;  [facility]: [facility];  Service: General;  Laterality: N/A;   COLONOSCOPY WITH PROPOFOL N/A 12/29/2017   Procedure: COLONOSCOPY WITH PROPOFOL;  Surgeon: [name], MD;  [facility]: [facility];  Service: Endoscopy;  Laterality: N/A;   CORONARY ANGIOGRAM  2010   CORONARY ARTERY BYPASS GRAFT N/A 11/29/2014   Procedure: CORONARY ARTERY BYPASS GRAFTING x 5 (LIMA-LAD, SVG-D, SVG-OM1-OM2, SVG-PD);  Surgeon: Melrose Nakayama, MD;  Location: [place];  Service: Open Heart Surgery;  Laterality: N/A;   CORONARY STENT INTERVENTION N/A 10/31/2016   Procedure: CORONARY STENT INTERVENTION;  Surgeon: Burnell Blanks, MD;  Location: [place] CV LAB;  Service: Cardiovascular;  Laterality: N/A;   ESOPHAGOGASTRODUODENOSCOPY (EGD) WITH PROPOFOL N/A 12/29/2017   Procedure: ESOPHAGOGASTRODUODENOSCOPY (EGD) WITH PROPOFOL;  Surgeon: [name], MD;  [facility];  Service: Endoscopy;  Laterality: N/A;   FRACTURE SURGERY     INSERT / REPLACE / REMOVE PACEMAKER  1999   INTRAMEDULLARY (IM) NAIL INTERTROCHANTERIC Left 04/04/2021   Procedure: INTRAMEDULLARY (IM) NAIL INTERTROCHANTRIC;  Surgeon: Vanetta Mulders, MD;  Location: [place];  Service: Orthopedics;  Laterality: Left;   IR FLUORO GUIDE CV LINE LEFT  04/11/2021   IR US GUIDE VASC ACCESS LEFT  04/11/2021   KNEE ARTHROSCOPY Bilateral    LEFT HEART CATH AND CORS/GRAFTS ANGIOGRAPHY N/A 12/09/2017   Procedure: LEFT HEART CATH AND  CORS/GRAFTS ANGIOGRAPHY;  Surgeon: Troy Sine, MD;  Location: [place] CV LAB;  Service: Cardiovascular;  Laterality: N/A;   LEFT HEART CATHETERIZATION WITH CORONARY ANGIOGRAM N/A 07/23/2012   Procedure: LEFT HEART CATHETERIZATION WITH CORONARY ANGIOGRAM;  Surgeon: Leonie Man, MD;  Location: [facility] CATH LAB;  Service: Cardiovascular;  Laterality: N/A;   LEXISCAN MYOVIEW  11/14/2011   Mild-moderate defect seen in Mid Inferolateral and Mid Anterolateral regions-consistant w/ infarct/scar. No significant ischemia demonstrated.   LUMBAR PERCUTANEOUS PEDICLE SCREW 4 LEVEL N/A 09/10/2021   Procedure: THORACIC SEVEN THROUGH THORACIC ELEVEN  PERCUTANEOUS PEDICLE SCREW PLACEMENT;  Surgeon: Judith Part, MD;  Location: [place];  Service: Neurosurgery;  Laterality: N/A;   POLYPECTOMY  12/29/2017   Procedure: POLYPECTOMY;  Surgeon: [name], MD;  [facility];  Service: Endoscopy;;   PPM GENERATOR CHANGEOUT N/A 12/31/2020   Procedure: PPM GENERATOR CHANGEOUT;  Surgeon: Sanda Klein, MD;  Location: [place] CV LAB;  Service: Cardiovascular;  Laterality: N/A;   RIGHT/LEFT HEART CATH AND CORONARY ANGIOGRAPHY N/A  10/09/2016   Procedure: RIGHT/LEFT HEART CATH AND CORONARY ANGIOGRAPHY;  Surgeon: Jolaine Artist, MD;  Location: [place] CV LAB;  Service: Cardiovascular;  Laterality: N/A;   RIGHT/LEFT HEART CATH AND CORONARY/GRAFT ANGIOGRAPHY N/A 03/11/2019   Procedure: RIGHT/LEFT HEART CATH AND CORONARY/GRAFT ANGIOGRAPHY;  Surgeon: Jolaine Artist, MD;  Location: [place] CV LAB;  Service: Cardiovascular;  Laterality: N/A;   TEE WITHOUT CARDIOVERSION N/A 11/29/2014   Procedure: TRANSESOPHAGEAL ECHOCARDIOGRAM (TEE);  Surgeon: Melrose Nakayama, MD;  Location: [place];  Service: Open Heart Surgery;  Laterality: N/A;   TRANSCAROTID ARTERY REVASCULARIZATION  Right 10/01/2020   Procedure: RIGHT TRANSCAROTID ARTERY REVASCULARIZATION;  Surgeon: [name], MD;   Location: [facility] OR;  Service: Vascular;  Laterality: Right;   TRANSTHORACIC ECHOCARDIOGRAM  07/23/2012   EF 55-60%, normal-mild   TUBAL LIGATION     ULTRASOUND GUIDANCE FOR VASCULAR ACCESS Left 10/01/2020   Procedure: ULTRASOUND GUIDANCE FOR VASCULAR ACCESS;  Surgeon: [name], MD;  Location: [facility] OR;  Service: Vascular;  Laterality: Left;    Family Hx:  Family History  Problem Relation Age of Onset   Breast cancer Mother    Diabetes Mother    Asthma Sister    Heart disease Maternal Grandmother    Kidney disease Maternal Grandmother    Diabetes Maternal Grandmother    Glaucoma Maternal Aunt    Heart disease Maternal Aunt    Colon cancer Neg Hx    Stomach cancer Neg Hx    Pancreatic cancer Neg Hx     Social History:  reports that she quit smoking about 54 years ago. Her smoking use included cigarettes. She has a 0.75 pack-year smoking history. She has never used smokeless tobacco. She reports that she does not drink alcohol and does not use drugs.  Allergies:  Allergies  Allergen Reactions   Ivp Dye [Iodinated Contrast Media] Shortness Of Breath    No reaction to PO contrast with non-ionic dye.06-25-2014/rsm   Shellfish Allergy Anaphylaxis   Sulfa Antibiotics Shortness Of Breath   Iodine Hives   Atorvastatin Other (See Comments)    Pt states "causes bilateral leg pain/cramps."   Benadryl [Diphenhydramine] Other (See Comments)    "THIS DRIVES ME CRAZY AND MAKES ME FEEL LIKE I AM DYING"    Colchicine Nausea And Vomiting   Doxycycline Other (See Comments)    Unknown reaction, per patient   Uloric [Febuxostat] Other (See Comments)    Unknown reaction   Cephalexin Itching and Rash   Zithromax [Azithromycin] Rash    Medications: Prior to Admission medications   Medication Sig Start Date End Date Taking? Authorizing Provider  acetaminophen (TYLENOL) 500 MG tablet Take 1,000 mg by mouth 3 (three) times daily as needed (for pain or headaches).   Yes [provider]  albuterol (PROVENTIL HFA;VENTOLIN HFA) 108 (90 Base) MCG/ACT inhaler Inhale 2 puffs into the lungs every 6 (six) hours as needed for wheezing or shortness of breath. 05/02/18  Yes Fuller Plan A, MD  albuterol (PROVENTIL) (2.5 MG/3ML) 0.083% nebulizer solution Take 3 mLs (2.5 mg total) by nebulization every 6 (six) hours as needed for wheezing or shortness of breath. 05/02/18  Yes Fuller Plan A, MD  allopurinol (ZYLOPRIM) 100 MG tablet Take 1 tablet (100 mg total) by mouth daily. Please contact the office to schedule appointment for additional refills. 1st Attempt. 12/05/21  Yes Croitoru, [name], MD  aspirin EC 81 MG tablet Take 1 tablet (81 mg total) by mouth daily. 03/27/16  Yes  Kilroy, Luke K, PA-C  budesonide-formoterol (SYMBICORT) 160-4.5 MCG/ACT inhaler Inhale 1 puff into the lungs 2 (two) times daily.   Yes [provider]  carboxymethylcellulose (REFRESH PLUS) 0.5 % SOLN Place 1 drop into both eyes 3 (three) times daily as needed (dry eyes).   Yes [provider]  clopidogrel (PLAVIX) 75 MG tablet Take 1 tablet (75 mg total) by mouth daily. 10/23/21  Yes Croitoru, [name], MD  cycloSPORINE (RESTASIS) 0.05 % ophthalmic emulsion Place 1 drop into both eyes 2 (two) times daily.   Yes [provider]  EPINEPHrine 0.3 mg/0.3 mL IJ SOAJ injection Inject 0.3 mg into the muscle once as needed for anaphylaxis (severe allergic reaction).   Yes [provider]  fluticasone (FLONASE) 50 MCG/ACT nasal spray Place 1 spray into both nostrils daily as needed for allergies.   Yes [provider]  furosemide (LASIX) 80 MG tablet Take 1 tablet (80 mg total) by mouth 2 (two) times daily. 01/10/22  Yes Clegg, Amy D, NP  hydrOXYzine (ATARAX) 25 MG tablet Take 25 mg by mouth 2 (two) times daily as needed for itching. 06/07/21  Yes [provider]  isosorbide mononitrate (IMDUR) 30 MG 24 hr tablet Take 3 tablets (90 mg total) by mouth daily. 10/23/21  Yes Croitoru,  [name], MD  Lidocaine 0.5 % AERO Place 1 spray rectally every 6 (six) hours as needed (hemorrhoids). Preparation H rapid relief lidocaine spray   Yes [provider]  metolazone (ZAROXOLYN) 2.5 MG tablet Take 1 tablet (2.5 mg total) by mouth once a week. AS NEEDED 10/23/21  Yes Clegg, Amy D, NP  nitroGLYCERIN (NITROSTAT) 0.4 MG SL tablet Place 1 tablet (0.4 mg total) under the tongue every 5 (five) minutes as needed for chest pain. 11/13/16 03/18/22 Yes Kilroy, Luke K, PA-C  ondansetron (ZOFRAN) 4 MG tablet Take 4 mg by mouth daily as needed for nausea or vomiting. 07/24/21  Yes [provider]  OVER THE COUNTER MEDICATION Apply 1 patch topically daily as needed (for pain). Lidocaine 4 %   Yes [provider]  OXYGEN Inhale 2-3 L/min into the lungs continuous.   Yes [provider]  oxymetazoline (AFRIN) 0.05 % nasal spray Place 1 spray into both nostrils 2 (two) times daily as needed for congestion.   Yes [provider]  pantoprazole (PROTONIX) 40 MG tablet Take 1 tablet (40 mg total) by mouth 2 (two) times daily. 07/08/19  Yes Nandigam, Venia Minks, MD  polyethylene glycol (MIRALAX / GLYCOLAX) 17 g packet Take 17 g by mouth daily as needed (constipation).   Yes [provider]  Potassium Chloride ER 20 MEQ TBCR Take 60 mEq by mouth daily in the afternoon. 10/23/21  Yes Clegg, Amy D, NP  rosuvastatin (CRESTOR) 20 MG tablet Take 1 tablet (20 mg total) by mouth every evening. NEED OV. 10/23/21  Yes Croitoru, [name], MD  simethicone (MYLICON) 80 MG chewable tablet Chew 80 mg by mouth 4 (four) times daily as needed for flatulence.   Yes [provider]  vitamin B-12 (CYANOCOBALAMIN) 1000 MCG tablet Take 1 tablet (1,000 mcg total) by mouth daily. 08/15/21  Yes Caren Griffins, MD  ACCU-CHEK AVIVA PLUS test strip Check blood sugar twice daily 10/19/19   [provider]  Accu-Chek Softclix Lancets lancets Check blood sugar twice daily 08/10/19    [provider]  Alcohol Swabs (B-D SINGLE USE SWABS REGULAR) PADS  08/10/19   [provider]  Blood Glucose Monitoring Suppl (ACCU-CHEK  GUIDE) w/Device KIT  06/26/21   [provider]  LINZESS 290 MCG CAPS capsule Take 290 mcg by mouth every morning. Patient not taking: Reported on 02/07/2022 08/17/21   [provider]  sucralfate (CARAFATE) 1 g tablet TAKE ONE TABLET BY MOUTH EVERY MORNING and TAKE ONE TABLET BY MOUTH AT NOON and TAKE ONE TABLET BY MOUTH EVERY EVENING and TAKE ONE TABLET BY MOUTH EVERYDAY AT BEDTIME 02/11/22   Vladimir Crofts, PA-C    I have reviewed the patient's current medications.  Labs: Renal Panel: Recent Labs    04/22/21 0433 04/23/21 0246 04/24/21 0215 04/25/21 0256 04/25/21 0626 04/26/21 0233 04/27/21 0248 04/28/21 0159 05/02/21 1519 08/10/21 0409 08/11/21 0421 08/12/21 0346 08/13/21 0352 08/14/21 0400 08/15/21 0725 08/27/21 1532 09/02/21 1221 09/09/21 1438 09/09/21 1443 09/11/21 0937 09/12/21 0601 09/15/21 0458 11/01/21 1214 11/02/21 0624 11/03/21 1119 11/04/21 0338 11/05/21 0021 01/26/22 1428 02/06/22 1518 02/07/22 0323 02/08/22 0247 02/09/22 0236 02/10/22 0257 02/11/22 0732  NA 130* 126* 133* 132*  --  133*  133*  --  131*   < > 136 137 137 138 138   < > 137 136 134*   < > 140 140   < > 139   < > _0  K 4.5 5.4* 4.3 4.3  --  4.4  4.4  --  4.1   < > 4.2 4.2 4.2 4.2 4.0   < > 2.9* 3.9 3.9   < > 3.9 3.8   < > 3.5   < > 4.1 4.2 4.2 3.2* 4.0 3.4* 3.6 3.0* 2.9* 3.3*  CL 93* 89* 95* 96*  --  97*  97*  --  96*   < > 88* 88* 88* 89* 89*   < > 89* 88* 91*   < > 100 97*   < > 97*   < > 96* 98 98 97* 99 101 101 99 95* 98  CO2 _1 --  27  27  --  25   < > 38* 39* 34* 35* 38*   < > 38* 30* 29   < > 32 30   < > 30   < > _2  GLUCOSE 129* 105* 100* 106*  --  109*  108*  --  93   < > 164* 187* 153* 145* 137*   < > 120* 135* 179*   < > 173* 125*    < > 125*   < > 154* 112* 147* 149* 119* 103* 111* 119* 118* 115*  BUN 64* 82* 36* 53*  --  31*  31*  --  32*   < > 49* 63* 72* 79* 85*   < > 96* 85* 118*   < > 92* 94*   < > 86*   < > 75* 74* 76* 103* 76* 71* 70* 70* 69* 62*  CREATININE 4.26* 5.36* 2.88* 4.27*  --  3.14*  3.15*  --  3.29*   < > 2.75* 2.95* 3.08* 3.03* 3.13*   < > 3.89* 4.03* 4.75*   < > 3.18* 3.03*   < > 3.96*   < > 3.35* 3.07* 3.16* 4.62* 3.46* 3.25* 3.00* 3.03* 2.88* 2.54*  CALCIUM 8.8* 9.0 9.2 9.3   < > 9.2  9.2  --  9.4   < > 8.7* 8.7* 8.6* 8.6* 8.7*   < > 9.9 9.3 9.3   < >  9.5 9.5   < > 9.6   < > 9.6 9.8 9.2 9.4 9.8 9.2 8.9 9.2 9.3 9.4  MG 2.2 2.5* 2.2 2.2  --  2.2   < > 2.4  --  2.0 2.4 2.2 2.3 2.3  --   --  2.1  --   --   --   --   --   --   --   --  2.0 1.9  --   --   --   --   --  1.8  --   PHOS 5.5* 6.4* 4.1 4.0  --  4.3  --   --   --  4.5 5.1* 4.9* 5.3* 5.1*  --   --   --   --   --   --   --   --   --   --   --   --   --   --   --   --   --   --   --   --   ALBUMIN 2.1* 2.3* 2.3* 2.4*  --  2.3*  2.3*  --   --    < > 2.8* 3.1* 3.2* 3.1* 3.0*  --  3.7  --  3.6  --  3.7 3.3*  --  3.0*  --   --   --   --   --   --   --   --   --   --   --    < > = values in this interval not displayed.     CBC:    Latest Ref Rng & Units 02/11/2022    7:32 AM 02/10/2022    2:57 AM 02/09/2022    2:36 AM  CBC  WBC 4.0 - 10.5 K/uL 7.7  7.3  6.1   Hemoglobin 12.0 - 15.0 g/dL 9.1  8.5  8.2   Hematocrit 36.0 - 46.0 % 28.7  28.9  28.2   Platelets 150 - 400 K/uL 173  161  150      Anemia Panel:  Recent Labs    04/21/21 0049 04/23/21 0246 08/08/21 0412 08/09/21 0402 02/07/22 0323 02/08/22 0247 02/09/22 0236 02/10/22 0257 02/11/22 0732  HGB  --    < > 9.0*   < > 8.6* 7.8* 8.2* 8.5* 9.1*  MCV  --    < > 107.0*   < > 97.0 96.4 96.9 94.4 92.9  VITAMINB12  --   --  109*  --   --   --  1,009*  --   --   FOLATE  --   --  7.6  --   --   --  9.1  --   --   FERRITIN 206  --  376*  --   --   --  838*  --   --   TIBC 329  --  225*  --   --    --  186*  --   --   IRON 39  --  34  --   --   --  29  --   --   RETICCTPCT  --   --  2.5  --   --   --  2.9  --   --    < > = values in this interval not displayed.    No results for input(s): "AST", "ALT", "ALKPHOS", "BILITOT", "PROT", "ALBUMIN" in the last 168 hours.  Lab Results  Component Value Date  HGBA1C 5.6 11/01/2021    ROS:  Pertinent items noted in HPI and remainder of comprehensive ROS otherwise negative.  Physical Exam: Vitals:   02/11/22 0758 02/11/22 0858  BP: (!) 145/72   Pulse: 83   Resp: 18   Temp: 99.1 F (37.3 C)   SpO2: 98% 97%     General exam: Appears calm and comfortable  Respiratory system: Distant breath sound, no wheezing or crackle. Cardiovascular system: S1 & S2 heard, RRR.  No pedal edema. Gastrointestinal system: Abdomen is nondistended, soft and nontender. Normal bowel sounds heard. Central nervous system: Alert and oriented. No focal neurological deficits. Extremities: Dressing applied in both legs, no edema noted.. Skin: No rashes, lesions or ulcers Psychiatry: Judgement and insight appear normal. Mood & affect appropriate.   Assessment/Plan:  # CKD stage IV with baseline serum creatinine level around 2.5-3.2.  She required dialysis from 03/2021-06/2021 and was discharged after renal recovery.  Currently, her creatinine level is around her baseline.  The mildly elevated AKI on admission presumably due to hemodynamically mediated.  From the chart and information obtained, the plan for orthopedic surgery tomorrow.  I will continue Lasix tonight and hold tomorrow morning.  Communicated this with the primary team.  Continue with strict ins and out, daily lab and please avoid hypotensive episode.  Avoid NSAIDs, morphine for the pain management interested can try Dilaudid.  I discontinued morphine from the order.  # Hypokalemia: Replete potassium chloride.  Monitor lab.  # Left intertrochanteric hip fracture with nonunion and fixation failure:  Possibly surgical intervention tomorrow.  PT OT and pain management.  # Chronic hypoxic respiratory failure: Stable and on home dose of oxygen.  Diuretics as above.  # Hypertension: Monitor BP, currently on Imdur.  # Chronic diastolic CHF: Looks well compensated.  Oral Lasix as above.  Continue cardiac medication.  Thank you for the consult.  We will continue to follow.   Berneita Sanagustin Tanna Furry 02/11/2022, 2:19 PM  [employer].

## 2022-02-12 ENCOUNTER — Other Ambulatory Visit: Payer: Self-pay

## 2022-02-12 ENCOUNTER — Inpatient Hospital Stay (HOSPITAL_COMMUNITY): Payer: Medicare Other

## 2022-02-12 ENCOUNTER — Encounter (HOSPITAL_COMMUNITY)
Admission: EM | Disposition: A | Discharge status: Skilled Nursing Facility | Payer: Self-pay | Source: Home / Self Care | Attending: Internal Medicine

## 2022-02-12 ENCOUNTER — Encounter (HOSPITAL_COMMUNITY): Payer: Self-pay | Admitting: Family Medicine

## 2022-02-12 ENCOUNTER — Inpatient Hospital Stay (HOSPITAL_COMMUNITY): Payer: Medicare Other | Admitting: Certified Registered Nurse Anesthetist

## 2022-02-12 ENCOUNTER — Encounter (HOSPITAL_COMMUNITY): Payer: Medicare Other

## 2022-02-12 DIAGNOSIS — S72142K Displaced intertrochanteric fracture of left femur, subsequent encounter for closed fracture with nonunion: Secondary | ICD-10-CM | POA: Diagnosis not present

## 2022-02-12 DIAGNOSIS — I509 Heart failure, unspecified: Secondary | ICD-10-CM | POA: Diagnosis not present

## 2022-02-12 DIAGNOSIS — J9611 Chronic respiratory failure with hypoxia: Secondary | ICD-10-CM | POA: Diagnosis not present

## 2022-02-12 DIAGNOSIS — I110 Hypertensive heart disease with heart failure: Secondary | ICD-10-CM

## 2022-02-12 DIAGNOSIS — I2510 Atherosclerotic heart disease of native coronary artery without angina pectoris: Secondary | ICD-10-CM | POA: Diagnosis not present

## 2022-02-12 DIAGNOSIS — J45909 Unspecified asthma, uncomplicated: Secondary | ICD-10-CM

## 2022-02-12 DIAGNOSIS — T85848A Pain due to other internal prosthetic devices, implants and grafts, initial encounter: Secondary | ICD-10-CM | POA: Diagnosis not present

## 2022-02-12 DIAGNOSIS — Z87891 Personal history of nicotine dependence: Secondary | ICD-10-CM

## 2022-02-12 DIAGNOSIS — Z9981 Dependence on supplemental oxygen: Secondary | ICD-10-CM

## 2022-02-12 DIAGNOSIS — E119 Type 2 diabetes mellitus without complications: Secondary | ICD-10-CM

## 2022-02-12 DIAGNOSIS — G4733 Obstructive sleep apnea (adult) (pediatric): Secondary | ICD-10-CM | POA: Diagnosis not present

## 2022-02-12 HISTORY — PX: CONVERSION TO TOTAL HIP: SHX5784

## 2022-02-12 LAB — PREPARE RBC (CROSSMATCH)

## 2022-02-12 LAB — CBC
HCT: 29.7 % — ABNORMAL LOW (ref 36.0–46.0)
Hemoglobin: 8.7 g/dL — ABNORMAL LOW (ref 12.0–15.0)
MCH: 27.9 pg (ref 26.0–34.0)
MCHC: 29.3 g/dL — ABNORMAL LOW (ref 30.0–36.0)
MCV: 95.2 fL (ref 80.0–100.0)
Platelets: 172 10*3/uL (ref 150–400)
RBC: 3.12 MIL/uL — ABNORMAL LOW (ref 3.87–5.11)
RDW: 15.7 % — ABNORMAL HIGH (ref 11.5–15.5)
WBC: 7.9 10*3/uL (ref 4.0–10.5)
nRBC: 0 % (ref 0.0–0.2)

## 2022-02-12 LAB — POCT I-STAT, CHEM 8
BUN: 77 mg/dL — ABNORMAL HIGH (ref 8–23)
BUN: 49 mg/dL — ABNORMAL HIGH (ref 8–23)
BUN: 52 mg/dL — ABNORMAL HIGH (ref 8–23)
Calcium, Ion: 1.21 mmol/L (ref 1.15–1.40)
Calcium, Ion: 1.19 mmol/L (ref 1.15–1.40)
Calcium, Ion: 1.22 mmol/L (ref 1.15–1.40)
Chloride: 98 mmol/L (ref 98–111)
Chloride: 101 mmol/L (ref 98–111)
Chloride: 101 mmol/L (ref 98–111)
Creatinine, Ser: 2.4 mg/dL — ABNORMAL HIGH (ref 0.44–1.00)
Creatinine, Ser: 2.4 mg/dL — ABNORMAL HIGH (ref 0.44–1.00)
Creatinine, Ser: 2.5 mg/dL — ABNORMAL HIGH (ref 0.44–1.00)
Glucose, Bld: 127 mg/dL — ABNORMAL HIGH (ref 70–99)
Glucose, Bld: 129 mg/dL — ABNORMAL HIGH (ref 70–99)
Glucose, Bld: 142 mg/dL — ABNORMAL HIGH (ref 70–99)
HCT: 33 % — ABNORMAL LOW (ref 36.0–46.0)
HCT: 28 % — ABNORMAL LOW (ref 36.0–46.0)
HCT: 31 % — ABNORMAL LOW (ref 36.0–46.0)
Hemoglobin: 11.2 g/dL — ABNORMAL LOW (ref 12.0–15.0)
Hemoglobin: 10.5 g/dL — ABNORMAL LOW (ref 12.0–15.0)
Hemoglobin: 9.5 g/dL — ABNORMAL LOW (ref 12.0–15.0)
Potassium: 5.1 mmol/L (ref 3.5–5.1)
Potassium: 3.7 mmol/L (ref 3.5–5.1)
Potassium: 3.8 mmol/L (ref 3.5–5.1)
Sodium: 137 mmol/L (ref 135–145)
Sodium: 138 mmol/L (ref 135–145)
Sodium: 139 mmol/L (ref 135–145)
TCO2: 31 mmol/L (ref 22–32)
TCO2: 28 mmol/L (ref 22–32)
TCO2: 29 mmol/L (ref 22–32)

## 2022-02-12 LAB — GLUCOSE, CAPILLARY
Glucose-Capillary: 108 mg/dL — ABNORMAL HIGH (ref 70–99)
Glucose-Capillary: 102 mg/dL — ABNORMAL HIGH (ref 70–99)
Glucose-Capillary: 139 mg/dL — ABNORMAL HIGH (ref 70–99)
Glucose-Capillary: 151 mg/dL — ABNORMAL HIGH (ref 70–99)

## 2022-02-12 LAB — RENAL FUNCTION PANEL
Albumin: 2.3 g/dL — ABNORMAL LOW (ref 3.5–5.0)
Anion gap: 9 (ref 5–15)
BUN: 60 mg/dL — ABNORMAL HIGH (ref 8–23)
CO2: 31 mmol/L (ref 22–32)
Calcium: 9.4 mg/dL (ref 8.9–10.3)
Chloride: 97 mmol/L — ABNORMAL LOW (ref 98–111)
Creatinine, Ser: 2.48 mg/dL — ABNORMAL HIGH (ref 0.44–1.00)
GFR, Estimated: 20 mL/min — ABNORMAL LOW (ref >60)
Glucose, Bld: 120 mg/dL — ABNORMAL HIGH (ref 70–99)
Phosphorus: 3.8 mg/dL (ref 2.5–4.6)
Potassium: 3.7 mmol/L (ref 3.5–5.1)
Sodium: 137 mmol/L (ref 135–145)

## 2022-02-12 LAB — SURGICAL PCR SCREEN
MRSA, PCR: NEGATIVE
Staphylococcus aureus: NEGATIVE

## 2022-02-12 SURGERY — TOTAL HIP REVISION
Anesthesia: General | Site: Hip | Laterality: Left

## 2022-02-12 SURGERY — CONVERSION, PREVIOUS HIP SURGERY, TO TOTAL HIP ARTHROPLASTY
Anesthesia: General | Site: Hip | Laterality: Left

## 2022-02-12 MED ORDER — SODIUM CHLORIDE (PF) 0.9 % IJ SOLN
INTRAMUSCULAR | Status: DC | PRN
  Administered 2022-02-12: 60 mL

## 2022-02-12 MED ORDER — LIDOCAINE 2% (20 MG/ML) 5 ML SYRINGE
INTRAMUSCULAR | Status: AC
  Filled 2022-02-12: qty 5

## 2022-02-12 MED ORDER — ACETAMINOPHEN 325 MG PO TABS: 325.0000 mg | ORAL_TABLET | Freq: Once | ORAL | Status: DC | PRN

## 2022-02-12 MED ORDER — ALBUMIN HUMAN 5 % IV SOLN
12.5000 g | Freq: Once | INTRAVENOUS | Status: AC
  Administered 2022-02-12: 12.5 g via INTRAVENOUS

## 2022-02-12 MED ORDER — ONDANSETRON HCL 4 MG/2ML IJ SOLN
INTRAMUSCULAR | Status: AC
  Filled 2022-02-12: qty 2

## 2022-02-12 MED ORDER — ROCURONIUM BROMIDE 10 MG/ML (PF) SYRINGE
PREFILLED_SYRINGE | INTRAVENOUS | Status: AC
  Filled 2022-02-12: qty 10

## 2022-02-12 MED ORDER — ALBUMIN HUMAN 5 % IV SOLN: INTRAVENOUS | Status: DC | PRN

## 2022-02-12 MED ORDER — FENTANYL CITRATE (PF) 250 MCG/5ML IJ SOLN
INTRAMUSCULAR | Status: AC
  Filled 2022-02-12: qty 5

## 2022-02-12 MED ORDER — AMISULPRIDE (ANTIEMETIC) 5 MG/2ML IV SOLN: 10.0000 mg | Freq: Once | INTRAVENOUS | Status: DC | PRN

## 2022-02-12 MED ORDER — ONDANSETRON HCL 4 MG/2ML IJ SOLN
INTRAMUSCULAR | Status: DC | PRN
  Administered 2022-02-12: 4 mg via INTRAVENOUS

## 2022-02-12 MED ORDER — SODIUM CHLORIDE 0.9 % IV SOLN: INTRAVENOUS | Status: DC | PRN

## 2022-02-12 MED ORDER — CHLORHEXIDINE GLUCONATE 0.12 % MT SOLN
15.0000 mL | Freq: Once | OROMUCOSAL | Status: AC
  Administered 2022-02-12: 15 mL via OROMUCOSAL
  Filled 2022-02-12: qty 15

## 2022-02-12 MED ORDER — LACTATED RINGERS IV SOLN: INTRAVENOUS | Status: DC

## 2022-02-12 MED ORDER — FENTANYL CITRATE (PF) 250 MCG/5ML IJ SOLN
INTRAMUSCULAR | Status: DC | PRN
  Administered 2022-02-12: 25 ug via INTRAVENOUS
  Administered 2022-02-12: 50 ug via INTRAVENOUS
  Administered 2022-02-12 (×2): 25 ug via INTRAVENOUS
  Administered 2022-02-12: 50 ug via INTRAVENOUS
  Administered 2022-02-12: 25 ug via INTRAVENOUS
  Administered 2022-02-12: 50 ug via INTRAVENOUS

## 2022-02-12 MED ORDER — 0.9 % SODIUM CHLORIDE (POUR BTL) OPTIME
TOPICAL | Status: DC | PRN
  Administered 2022-02-12: 1000 mL

## 2022-02-12 MED ORDER — PHENYLEPHRINE HCL-NACL 20-0.9 MG/250ML-% IV SOLN
INTRAVENOUS | Status: DC | PRN
  Administered 2022-02-12: 25 ug/min via INTRAVENOUS

## 2022-02-12 MED ORDER — LIDOCAINE 2% (20 MG/ML) 5 ML SYRINGE
INTRAMUSCULAR | Status: DC | PRN
  Administered 2022-02-12: 40 mg via INTRAVENOUS

## 2022-02-12 MED ORDER — ROCURONIUM BROMIDE 10 MG/ML (PF) SYRINGE
PREFILLED_SYRINGE | INTRAVENOUS | Status: DC | PRN
  Administered 2022-02-12: 10 mg via INTRAVENOUS
  Administered 2022-02-12: 20 mg via INTRAVENOUS
  Administered 2022-02-12: 10 mg via INTRAVENOUS
  Administered 2022-02-12: 60 mg via INTRAVENOUS

## 2022-02-12 MED ORDER — ACETAMINOPHEN 10 MG/ML IV SOLN
INTRAVENOUS | Status: DC | PRN
  Administered 2022-02-12: 1000 mg via INTRAVENOUS

## 2022-02-12 MED ORDER — DEXAMETHASONE SODIUM PHOSPHATE 10 MG/ML IJ SOLN
INTRAMUSCULAR | Status: AC
  Filled 2022-02-12: qty 1

## 2022-02-12 MED ORDER — ALBUMIN HUMAN 5 % IV SOLN
INTRAVENOUS | Status: AC
  Filled 2022-02-12: qty 250

## 2022-02-12 MED ORDER — PHENYLEPHRINE 80 MCG/ML (10ML) SYRINGE FOR IV PUSH (FOR BLOOD PRESSURE SUPPORT)
PREFILLED_SYRINGE | INTRAVENOUS | Status: DC | PRN
  Administered 2022-02-12 (×4): 160 ug via INTRAVENOUS
  Administered 2022-02-12: 40 ug via INTRAVENOUS
  Administered 2022-02-12: 160 ug via INTRAVENOUS

## 2022-02-12 MED ORDER — OXYCODONE HCL 5 MG PO TABS
ORAL_TABLET | ORAL | Status: AC
  Filled 2022-02-12: qty 1

## 2022-02-12 MED ORDER — DEXAMETHASONE SODIUM PHOSPHATE 10 MG/ML IJ SOLN
INTRAMUSCULAR | Status: DC | PRN
  Administered 2022-02-12: 4 mg via INTRAVENOUS

## 2022-02-12 MED ORDER — SUGAMMADEX SODIUM 200 MG/2ML IV SOLN
INTRAVENOUS | Status: DC | PRN
  Administered 2022-02-12: 200 mg via INTRAVENOUS

## 2022-02-12 MED ORDER — SODIUM CHLORIDE (PF) 0.9 % IJ SOLN
INTRAMUSCULAR | Status: AC
  Filled 2022-02-12: qty 60

## 2022-02-12 MED ORDER — ACETAMINOPHEN 160 MG/5ML PO SOLN: 325.0000 mg | Freq: Once | ORAL | Status: DC | PRN

## 2022-02-12 MED ORDER — FENTANYL CITRATE (PF) 100 MCG/2ML IJ SOLN
25.0000 ug | INTRAMUSCULAR | Status: DC | PRN
  Administered 2022-02-12 (×4): 25 ug via INTRAVENOUS

## 2022-02-12 MED ORDER — PROMETHAZINE HCL 25 MG/ML IJ SOLN: 6.2500 mg | INTRAMUSCULAR | Status: DC | PRN

## 2022-02-12 MED ORDER — PROPOFOL 10 MG/ML IV BOLUS
INTRAVENOUS | Status: DC | PRN
  Administered 2022-02-12: 130 mg via INTRAVENOUS

## 2022-02-12 MED ORDER — ORAL CARE MOUTH RINSE: 15.0000 mL | Freq: Once | OROMUCOSAL | Status: AC

## 2022-02-12 MED ORDER — ALBUTEROL SULFATE HFA 108 (90 BASE) MCG/ACT IN AERS
INHALATION_SPRAY | RESPIRATORY_TRACT | Status: AC
  Filled 2022-02-12: qty 6.7

## 2022-02-12 MED ORDER — PROPOFOL 10 MG/ML IV BOLUS
INTRAVENOUS | Status: AC
  Filled 2022-02-12: qty 20

## 2022-02-12 MED ORDER — ACETAMINOPHEN 10 MG/ML IV SOLN: 1000.0000 mg | Freq: Once | INTRAVENOUS | Status: DC | PRN

## 2022-02-12 MED ORDER — ALBUTEROL SULFATE HFA 108 (90 BASE) MCG/ACT IN AERS
INHALATION_SPRAY | RESPIRATORY_TRACT | Status: DC | PRN
  Administered 2022-02-12: 4 via RESPIRATORY_TRACT

## 2022-02-12 MED ORDER — OXYCODONE HCL 5 MG PO TABS
5.0000 mg | ORAL_TABLET | Freq: Once | ORAL | Status: AC
  Administered 2022-02-12: 5 mg via ORAL

## 2022-02-12 MED ORDER — BUPIVACAINE LIPOSOME 1.3 % IJ SUSP
INTRAMUSCULAR | Status: DC | PRN
  Administered 2022-02-12: 20 mL

## 2022-02-12 MED ORDER — BUPIVACAINE LIPOSOME 1.3 % IJ SUSP
INTRAMUSCULAR | Status: AC
  Filled 2022-02-12: qty 20

## 2022-02-12 MED ORDER — FUROSEMIDE 40 MG PO TABS
80.0000 mg | ORAL_TABLET | Freq: Two times a day (BID) | ORAL | Status: DC
  Administered 2022-02-12 – 2022-02-13 (×2): 80 mg via ORAL
  Filled 2022-02-12 (×4): qty 2

## 2022-02-12 MED ORDER — SODIUM CHLORIDE 0.9 % IR SOLN
Status: DC | PRN
  Administered 2022-02-12: 3000 mL

## 2022-02-12 MED ORDER — FENTANYL CITRATE (PF) 100 MCG/2ML IJ SOLN
INTRAMUSCULAR | Status: AC
  Filled 2022-02-12: qty 2

## 2022-02-12 MED ORDER — IRRISEPT - 450ML BOTTLE WITH 0.05% CHG IN STERILE WATER, USP 99.95% OPTIME
TOPICAL | Status: DC | PRN
  Administered 2022-02-12: 450 mL

## 2022-02-12 SURGICAL SUPPLY — 73 items
BLADE SAGITTAL 25.0X1.27X90 (BLADE) ×1 IMPLANT
BNDG ELASTIC 4X5.8 VLCR STR LF (GAUZE/BANDAGES/DRESSINGS) IMPLANT
BNDG GAUZE DERMACEA FLUFF 4 (GAUZE/BANDAGES/DRESSINGS) IMPLANT
BRUSH FEMORAL CANAL (MISCELLANEOUS) IMPLANT
CHLORAPREP W/TINT 26 (MISCELLANEOUS) ×2 IMPLANT
COVER SURGICAL LIGHT HANDLE (MISCELLANEOUS) ×1 IMPLANT
DERMABOND ADVANCED .7 DNX12 (GAUZE/BANDAGES/DRESSINGS) ×1 IMPLANT
DRAPE HALF SHEET 40X57 (DRAPES) ×1 IMPLANT
DRAPE HIP W/POCKET STRL (MISCELLANEOUS) ×1 IMPLANT
DRAPE INCISE 23X17 IOBAN STRL (DRAPES) ×2
DRAPE INCISE 23X17 STRL (DRAPES) IMPLANT
DRAPE INCISE IOBAN 23X17 STRL (DRAPES) ×2 IMPLANT
DRAPE INCISE IOBAN 66X45 STRL (DRAPES) ×1 IMPLANT
DRAPE INCISE IOBAN 85X60 (DRAPES) ×1 IMPLANT
DRAPE POUCH INSTRU U-SHP 10X18 (DRAPES) ×1 IMPLANT
DRAPE U-SHAPE 47X51 STRL (DRAPES) ×2 IMPLANT
DRESSING MEPILEX FLEX 4X4 (GAUZE/BANDAGES/DRESSINGS) IMPLANT
DRSG AQUACEL AG ADV 3.5X10 (GAUZE/BANDAGES/DRESSINGS) ×1 IMPLANT
DRSG AQUACEL AG ADV 3.5X14 (GAUZE/BANDAGES/DRESSINGS) IMPLANT
DRSG MEPILEX FLEX 4X4 (GAUZE/BANDAGES/DRESSINGS) ×1
ELECT BLADE 4.0 EZ CLEAN MEGAD (MISCELLANEOUS) ×1
ELECT REM PT RETURN 15FT ADLT (MISCELLANEOUS) ×1 IMPLANT
ELECTRODE BLDE 4.0 EZ CLN MEGD (MISCELLANEOUS) ×1 IMPLANT
GLOVE SRG 8 PF TXTR STRL LF DI (GLOVE) ×1 IMPLANT
GLOVE SURG ORTHO LTX SZ8 (GLOVE) ×2 IMPLANT
GLOVE SURG UNDER POLY LF SZ8 (GLOVE) ×1
GOWN STRL REUS W/ TWL XL LVL3 (GOWN DISPOSABLE) ×1 IMPLANT
GOWN STRL REUS W/TWL MED LVL3 (GOWN DISPOSABLE) ×2 IMPLANT
GOWN STRL REUS W/TWL XL LVL3 (GOWN DISPOSABLE) ×1
HANDPIECE INTERPULSE COAX TIP (DISPOSABLE) ×1
HEAD CERAMIC FEMORAL 36MM (Head) IMPLANT
HIP MODULAR SYS 21MM P 0V40 (Hips) ×1 IMPLANT
HOOD PEEL AWAY FLYTE STAYCOOL (MISCELLANEOUS) ×3 IMPLANT
INSERT 0 DEGREE 36 (Miscellaneous) IMPLANT
KIT BASIN OR (CUSTOM PROCEDURE TRAY) ×1 IMPLANT
KIT TURNOVER KIT A (KITS) ×1 IMPLANT
MANIFOLD NEPTUNE II (INSTRUMENTS) ×1 IMPLANT
MARKER SKIN DUAL TIP RULER LAB (MISCELLANEOUS) ×1 IMPLANT
NDL 18GX1X1/2 (RX/OR ONLY) (NEEDLE) ×1 IMPLANT
NEEDLE 18GX1X1/2 (RX/OR ONLY) (NEEDLE) ×1 IMPLANT
NS IRRIG 1000ML POUR BTL (IV SOLUTION) ×1 IMPLANT
PACK TOTAL JOINT (CUSTOM PROCEDURE TRAY) ×1 IMPLANT
PLATE GRIP FOR CABLE SS MED (Plate) IMPLANT
PRESSURIZER FEMORAL UNIV (MISCELLANEOUS) IMPLANT
RETRIEVER SUT HEWSON (MISCELLANEOUS) ×1 IMPLANT
SCREW HEX LP 6.5X30 (Screw) IMPLANT
SCREW HEX LP 6.5X35 (Screw) IMPLANT
SEALER BIPOLAR AQUA 6.0 (INSTRUMENTS) ×1 IMPLANT
SET HNDPC FAN SPRY TIP SCT (DISPOSABLE) ×1 IMPLANT
SET INTERPULSE LAVAGE W/TIP (ORTHOPEDIC DISPOSABLE SUPPLIES) ×1 IMPLANT
SHELL ACETAB MULTIHOLE 50D (Shell) IMPLANT
SLEEVE BEADED CABLE (Orthopedic Implant) IMPLANT
SLEEVE CABLE 2MM VT (Orthopedic Implant) IMPLANT
SPONGE T-LAP 18X18 ~~LOC~~+RFID (SPONGE) ×2 IMPLANT
STEM FEM MOD RESTORE 155X19 (Stem) IMPLANT
SUCTION FRAZIER HANDLE 10FR (MISCELLANEOUS) ×1
SUCTION TUBE FRAZIER 10FR DISP (MISCELLANEOUS) ×1 IMPLANT
SUT ETHIBOND 2 V 37 (SUTURE) ×1 IMPLANT
SUT MNCRL AB 3-0 PS2 18 (SUTURE) ×1 IMPLANT
SUT STRATAFIX 1PDS 45CM VIOLET (SUTURE) ×2 IMPLANT
SUT VIC AB 0 CT1 27 (SUTURE) ×2
SUT VIC AB 0 CT1 27XBRD ANBCTR (SUTURE) ×1 IMPLANT
SUT VIC AB 1 CT1 27 (SUTURE) ×1
SUT VIC AB 1 CT1 27XBRD ANBCTR (SUTURE) IMPLANT
SUT VIC AB 2-0 CT2 27 (SUTURE) ×2 IMPLANT
SYR 20ML LL LF (SYRINGE) ×1 IMPLANT
SYR 50ML LL SCALE MARK (SYRINGE) ×1 IMPLANT
SYSTEM MODULAR HIP 21MM P 0V40 (Hips) IMPLANT
TOWEL GREEN STERILE (TOWEL DISPOSABLE) ×1 IMPLANT
TOWER CARTRIDGE SMART MIX (DISPOSABLE) IMPLANT
TRAY FOLEY MTR SLVR 16FR STAT (SET/KITS/TRAYS/PACK) ×1 IMPLANT
TUBE SUCT ARGYLE STRL (TUBING) ×1 IMPLANT
WATER STERILE IRR 1000ML POUR (IV SOLUTION) ×1 IMPLANT

## 2022-02-12 NOTE — Progress Notes (Signed)
Brief nephrology note: Unable to see the patient today as she is in OR.  Chart and lab results reviewed.  Creatinine level improving.  Urine output is excellent.  I will resume furosemide 80 mg from tonight.  Monitor lab. We will see her back tomorrow morning.

## 2022-02-12 NOTE — Progress Notes (Signed)
Pharmacy Antibiotic Note  [name] is a 77 y.o. female admitted on 02/06/2022 with UTI.  Pharmacy has been consulted for Unasyn dosing. Patient has a history of UTI w/Proteus Mirabilis from March 2023 Patient growing Klebsiella Pneumoniae in urine. Sensitive to all but ampicillin. Allergy to cephalosporins. No change at this time.   Plan: Unasyn 3 grams IV every 12 hours Monitor clinical progress, cultures/sensitivities, renal function, abx plan  Height: '5\' 3"'$  (160 cm) Weight: 89.9 kg (198 lb 3.1 oz) IBW/kg (Calculated) : 52.4  Temp (24hrs), Avg:98.1 F (36.7 C), Min:97.7 F (36.5 C), Max:98.6 F (37 C)  Recent Labs  Lab 02/08/22 0247 02/09/22 0236 02/10/22 0257 02/11/22 0732 02/12/22 0313  WBC 7.1 6.1 7.3 7.7 7.9  CREATININE 3.00* 3.03* 2.88* 2.54* 2.48*     Estimated Creatinine Clearance: 20.2 mL/min (A) (by C-G formula based on SCr of 2.48 mg/dL (H)).    Allergies  Allergen Reactions   Ivp Dye [Iodinated Contrast Media] Shortness Of Breath    No reaction to PO contrast with non-ionic dye.06-25-2014/rsm   Shellfish Allergy Anaphylaxis   Sulfa Antibiotics Shortness Of Breath   Iodine Hives   Atorvastatin Other (See Comments)    Pt states "causes bilateral leg pain/cramps."   Benadryl [Diphenhydramine] Other (See Comments)    "THIS DRIVES ME CRAZY AND MAKES ME FEEL LIKE I AM DYING"    Colchicine Nausea And Vomiting   Doxycycline Other (See Comments)    Unknown reaction, per patient   Uloric [Febuxostat] Other (See Comments)    Unknown reaction   Cephalexin Itching and Rash   Zithromax [Azithromycin] Rash    Antimicrobials this admission: 12/31 Unasyn >>   Dose adjustments this admission:  Microbiology results: 12/31 UCx: Klebsiella Pneumoniae, R ampicillin, S-all others   Demeka Sutter A. Levada Dy, PharmD, BCPS, FNKF Clinical Pharmacist [place] Please utilize Amion for appropriate phone number to reach the unit pharmacist ([place])

## 2022-02-12 NOTE — H&P (View-Only) (Signed)
     Subjective: Patient reports pain as well controlled when at rest. No new issues. Denies distal n/t. Aspiration results have been negative.  Objective:   VITALS:   Vitals:   02/12/22 0428 02/12/22 0834 02/12/22 0837 02/12/22 1059  BP: (!) 123/55 (!) 115/57  135/63  Pulse: 84 83  85  Resp: 18   18  Temp: 98.1 F (36.7 C) 98.1 F (36.7 C)  98.2 F (36.8 C)  TempSrc: Oral Oral  Oral  SpO2: 100% 97% 96% 98%  Weight:    85.7 kg  Height:    _0  (1.6 m)    Sensation intact distally Intact pulses distally Dorsiflexion/Plantar flexion intact Compartment soft   Lab Results  Component Value Date   WBC 7.9 02/12/2022   HGB 8.7 (L) 02/12/2022   HCT 29.7 (L) 02/12/2022   MCV 95.2 02/12/2022   PLT 172 02/12/2022   BMET    Component Value Date/Time   NA 137 02/12/2022 0313   NA 136 09/02/2021 1221   K 3.7 02/12/2022 0313   CL 97 (L) 02/12/2022 0313   CO2 31 02/12/2022 0313   GLUCOSE 120 (H) 02/12/2022 0313   BUN 60 (H) 02/12/2022 0313   BUN 85 (HH) 09/02/2021 1221   CREATININE 2.48 (H) 02/12/2022 0313   CREATININE 1.05 04/04/2014 1014   CALCIUM 9.4 02/12/2022 0313   CALCIUM 9.3 04/25/2021 0626   EGFR 11 (L) 09/02/2021 1221   GFRNONAA 20 (L) 02/12/2022 0313    Assessment/Plan: Day of Surgery   Principal Problem:   Pain from implanted hardware Active Problems:   HTN (hypertension), benign   Gastritis without bleeding   Cardiomyopathy ([place])   OSA (obstructive sleep apnea)   Mixed hypercholesterolemia and hypertriglyceridemia   CAD S/P percutaneous coronary angioplasty   Chronic diastolic CHF (congestive heart failure) (H[facility])   CHB (complete heart block) (HCC)   Atrial fibrillation (HCC)   ESRD (end stage renal disease) ([place])   Diabetes mellitus without complication (HCC)   Thrombocytopenia ([place])   Irritable bowel syndrome with constipation   Congestive heart failure (CHF) (HCC)   Supplemental oxygen dependent   Hypertension   CHF (congestive heart  failure) ([place])   Diabetic renal disease ([place])   Pure hypercholesterolemia   Physical deconditioning   Chronic hypoxemic respiratory failure (HCC)   Pressure injury of back, stage 2 ([place])  Plan for hardware removal and convesion intertroch nonunion to total hip arthroplasty. Possible abx spacer. Will determine based on intra-op assessment and frozen path specimen to rule out infection further. If infection will plan for abx hip spacer. IF no infection will plan for hip revision components pressfit vs cemented.  The risks benefits and alternatives were discussed with the patient including but not limited to the risks of nonoperative treatment, versus surgical intervention including infection, bleeding, nerve injury, periprosthetic fracture, the need for revision surgery, dislocation, leg length discrepancy, blood clots, cardiopulmonary complications, morbidity, mortality, among others, and they were willing to proceed.   Consent was signed by myself and the patient.  Left  leg was marked.   Dalin Caldera A Delmer Kowalski 02/12/2022, 12:30 PM   Charlies Constable, MD  Contact information:   [phone] 7am-5pm epic message Dr. Zachery Dakins, or call office for patient follow up: [phone] After hours and holidays please check [email or web address] for group call information for [facility]

## 2022-02-12 NOTE — Op Note (Addendum)
02/06/2022 - 02/12/2022  4:37 PM  PATIENT:  [name]   MRN: [id number]  PRE-OPERATIVE DIAGNOSIS: Left intertrochanteric femur fracture nonunion with fixation failure  POST-OPERATIVE DIAGNOSIS:  same  PROCEDURE:  Procedure(s): Removal of left cephalomedullary femoral nail, conversion left total hip arthroplasty, ORIF of greater trochanter fracture with trochanteric hook plate  PREOPERATIVE INDICATIONS:    [name] is an 77 y.o. female who has a diagnosis of nonunion left intertrochanteric femur fracture after a fall earlier in December with failure of fixation of her intertrochanteric fracture that was done by Dr. Reece Packer and in February 2023.  X-rays demonstrated propagation of the soft medullary screw through the medial pelvic wall.  Preoperative workup including hip aspiration negative for infection.  The risks benefits and alternatives were discussed with the patient including but not limited to the risks of nonoperative treatment, versus surgical intervention including infection, bleeding, nerve injury, periprosthetic fracture, the need for revision surgery, dislocation, leg length discrepancy, blood clots, cardiopulmonary complications, morbidity, mortality, among others, and they were willing to proceed.     OPERATIVE REPORT     SURGEON:  Charlies Constable, MD    ASSISTANT: Izola Price, RNFA, (Present throughout the entire procedure,  necessary for completion of procedure in a timely manner, assisting with retraction, instrumentation, and closure)     ANESTHESIA: General  ESTIMATED BLOOD LOSS: 341DQ    COMPLICATIONS:  None.     UNIQUE ASPECTS OF THE CASE: No gross signs of infection intraoperatively to soft tissue specimens 1 from the left hip joint and 1 from the left femoral canal were sent for frozen histopathology.  Per pathologist there was few white blood cells and Gram stain was negative for bacteria  COMPONENTS:   [employer] Trident 250 mm multihole acetabular  shell, neutral polyethylene X.3 liner, 6.5 hex screws x 2, restoration modular hip system 155 mm x 19 mm diaphyseal distal engaging stem, 21 mm +0 proximal body, 36+0 femoral head, medium 150 mm trochanteric plate with two 2.0 Dall-Miles cables Implant Name Type Inv. Item Serial No. Manufacturer Lot No. LRB No. Used Action  INSERT 0 DEGREE 36 - [id number] Miscellaneous INSERT 0 DEGREE 36  [employer] [id number] Left 1 Implanted  SCREW HEX LP 6.5X30 - [id number] Screw SCREW HEX LP 6.5X30  [employer] [id number] Left 1 Implanted  Trident Multihole Acetabular Shell 52m D Shell   [employer] [id number] Left 1 Implanted  SCREW HEX LP 6.5X35 - [id number] SCREW HEX LP 6.5X35  [employer] [id number] Left 1 Implanted  STEM FEM MOD RESTORE 155X19 - [id number] STEM FEM MOD RESTORE 155X19  [employer] [id number] Left 1 Implanted  HIP MODULAR SYS 21MM P 0V40 - [id number] HIP MODULAR SYS 21MM P 0V40  [employer] [id number] 1 Implanted  HEAD CERAMIC FEMORAL 36MM - [id number] HEAD CERAMIC FEMORAL 36MM  [employer] [id number] 1 Implanted  Trochanteric Grip Plate Plate    [id number] 1 Implanted  SLEEVE BEADED CABLE - [id number] Implant SLEEVE BEADED CABLE  [employer] [id number] 1 Implanted      PROCEDURE IN DETAIL:   The patient was met in the holding area and  identified.  The appropriate hip was identified and marked at the operative site.  The patient was then transported to the OR  and  placed under anesthesia.  At that point, the patient was  placed in the lateral decubitus position with the operative side up and  secured to the operating room  table  and all bony prominences padded. A subaxillary role was also placed.    The operative lower extremity was prepped from the iliac crest to the distal leg.  Sterile draping was performed.  Preoperative antibiotics, 1 gm of vanc as well as scheduled unasyn.  TXA was not given  because of the patient's history of stroke and cardiac disease.  Time out was performed prior to incision.      An extended posterolateral approach was utilized via sharp dissection utilizing part of the patient's old scars.  Her old incisions were well-healed without erythema.  Dissection was carried down to the subcutaneous tissue.  Gross bleeders were Bovie coagulated.  The iliotibial band was identified and incised along the length of the skin incision through the glute max fascia.  Charnley retractor was placed with care to protect the sciatic nerve posteriorly.  Through the fracture fragments and that the cephalomedullary screw in the proximal aspect of the nail were identified.  These were cleaned out.  The setscrew was disengaged and the nail extractor was inserted proximally as well as through the cephalomedullary screw.  Soft medullary screw was carefully backed out without complication.  We then turned our attention to the distal interlocks.  Utilizing patient's old incision in the skin was incised and the IT black band was split longitudinally.  The screws were identified on the lateral femur and easily extracted.  We then carefully remove the abnormal nail proximally.  Next we dissected along the proximal lateral femur through a subvastus approach.  Once femur was identified a cable passer was passed circumferentially and a 2.0 mm Dall-Miles cable was tensioned and cut.  This was placed to prevent propagation of the intertrochanteric fracture through the femoral shaft during manipulation of the leg and reaming for the femoral implant.  Tissue both from the hip joint at this point was collected and sent to pathology to rule out infection and as well as soft tissue from the intramedullary canal that came off and the nail was removed.  With the hip internally rotated, a capsulotomy was then performed off the femoral insertion, down to the hip joint.  The hip was then carefully dislocated.  A saw was used  to make a cervical neck cut to expose the femoral head.  I then exposed the deep acetabulum, cleared out any tissue including the ligamentum teres.   After adequate visualization, I excised the labrum.  There was a central defect from the cephalomedullary screw which had penetrated the medial wall however the columns as well as the anterior and posterior wall were intact and good integrity.  I then started reaming with a 46 mm reamer, first medializing to the floor of the cotyloid fossa, and then in the position of the cup aiming towards the greater sciatic notch, matching the version of the transverse acetabular ligament and tucked under the anterior wall. I reamed up to 50 mm reamer with good bony bed preparation and a 50 mm cup was chosen.  The real cup was then impacted into place.  Appropriate version and inclination was confirmed clinically matching their bony anatomy, and also with the use of the jig.  I placed 2 screws in the posterior superior quadrant to augment fixation.  Received call from pathology at this point then the tissue specimen sent showed few white blood cells and no bacteria.  At this point felt safe to continue with planned conversion to total hip arthroplasty.  3 additional intra-articular specimens from the joint  lining were sent for routine aerobic anaerobic culture.  Considered use of a MDM liner given the troches fracture however given that we only do 50 mm cup this would have resulted in a 22 mm metal inner ball and the outer diameter of our 2 mobility construct will be 38 mm.  We would also have a limited head ball options.  Given these options elected for a neutral liner instead. A neutral liner was placed and impacted. It was confirmed to be appropriately seated and the acetabular retractors were removed.    The hip was then carefully flexed and internally rotated and the proximal femur was visualized.  We started with a Charnley awl to identify the canal.  We gradually  reamed starting with a 13 mm reamer checking fluoroscopy intermittently to make sure that we were not lateral or anterior.  We had to adjust to position a couple of times because the reamer started to impinge anteriorly but felt we were in a good position on the anterior view.  We reamed up to 19 mm which had a good cortical fit.  We open the real 19 mm x 155 mm distal stem which was impacted.  This had excellent axial and rotational stability.   Next we reamed for the proximal body.  Elected for 21 proximal body given that the patient has very little offset at baseline.  Trialed the proximal body with about 40 degrees of anteversion.  The +0 proximal body was trialed with a +0 ball. I reduced the hip and it was found to have excellent stability.  There was no impingement with full extension and 90 degrees external rotation.  The hip was stable at the position of sleep and with 90 degrees flexion and 90 degrees of internal rotation.  Leg lengths were also clinically assessed in the lateral position and felt to be equal. Intra-Op flatplate was obtained and confirmed appropriate component positions. No evidence or concern for fracture.  A 21+0 proximal body was selected.  This was impacted onto the distal stem and once appropriately seated with the guide began 40 degrees of anteversion was secured to the distal body with the screw and tightened to 180 foot-pounds of torque using the tensioner. I again trialed and selected a 36+ 17m ball. The hip was then reduced and taken through a range of motion. There was no impingement with full extension and 90 degrees external rotation.  The hip was stable at the position of sleep and with 90 degrees flexion and 90 degrees of internal rotation. Leg lengths were  again assessed and felt to be restored.  We then opened, and I impacted the real head ball into place.  At this point evaluated the integrity of the trochanteric fracture.  There was a large posterior  trochanteric fragment that I felt would be amenable to fixation with a trochanteric grip plate.  Given the length of the fracture a 150 mm medium trial plate was selected and open.  2 cables were plastic passed circumferentially around the femur and 1 proximally around the trochanteric fragment and around the proximal femoral component.  A second cable was placed distally along the proximal femur.  These were both tensioned, crimped, and cut.  At this point felt we had excellent fixation of the trochanteric fracture.  I then irrigated the hip copiously with Irrisept irrigation and with 3L normal saline pulse lavage. Periarticular injection was then performed with Exparel.  The vastus lateralis fascia was repaired with a running 0 Vicryl.  This completely covered the trochanteric plate.   We repaired the IT band and glutes max fascia with #1 Vicryl and #1 barbed suture, followed by 0 barbed suture for the subcutaneous fat.  Skin was closed with 2-0 Vicryl and 3-0 Monocryl.  Distally given that the patient had old scar tissue was closed with 0 Vicryl for the IT band, 2-0 Vicryl and 3-0 nylon for skin.  Dermabond and Aquacel dressing were applied. The patient was then awakened and returned to PACU in stable and satisfactory condition.  Leg lengths in the supine position were assessed and felt to be clinically equal. There were no complications.  Post op recs: WB: 50% WBAT LLE, posterior hip precautions x 6 weeks, no active abduction. Abx: Vancomycin + Unasyn continue abx pending intra-op cxs Imaging: PACU pelvis Xray Dressing: Aquacell, keep intact until follow up DVT prophylaxis: Lovenox daily starting postop day 1 x 4 weeks Follow up: 2 weeks after surgery for a wound check with Dr. Zachery Dakins at [facility].  Address: [address], [place], [place] [address]  Office Phone: [phone]   Charlies Constable, MD Orthopedic Surgeon

## 2022-02-12 NOTE — Discharge Instructions (Signed)
INSTRUCTIONS AFTER JOINT REPLACEMENT   Remove items at home which could result in a fall. This includes throw rugs or furniture in walking pathways ICE to the affected joint every three hours while awake for 30 minutes at a time, for at least the first 3-5 days, and then as needed for pain and swelling.  Continue to use ice for pain and swelling. You may notice swelling that will progress down to the foot and ankle.  This is normal after surgery.  Elevate your leg when you are not up walking on it.   Continue to use the breathing machine you got in the hospital (incentive spirometer) which will help keep your temperature down.  It is common for your temperature to cycle up and down following surgery, especially at night when you are not up moving around and exerting yourself.  The breathing machine keeps your lungs expanded and your temperature down.   DIET:  As you were doing prior to hospitalization, we recommend a well-balanced diet.  DRESSING / WOUND CARE / SHOWERING  Keep the surgical dressing until follow up.  The dressing is water proof, so you can shower without any extra covering.  IF THE DRESSING FALLS OFF or the wound gets wet inside, change the dressing with sterile gauze.  Please use good hand washing techniques before changing the dressing.  Do not use any lotions or creams on the incision until instructed by your surgeon.    ACTIVITY  Increase activity slowly as tolerated, but follow the weight bearing instructions below.   No driving for 6 weeks or until further direction given by your physician.  You cannot drive while taking narcotics.  No lifting or carrying greater than 10 lbs. until further directed by your surgeon. Avoid periods of inactivity such as sitting longer than an hour when not asleep. This helps prevent blood clots.  You may return to work once you are authorized by your doctor.     WEIGHT BEARING   50% Weight bearing with assist device (walker, cane, etc) as  directed, use it as long as suggested by your surgeon or therapist, typically at least 4-6 weeks.   EXERCISES  Results after joint replacement surgery are often greatly improved when you follow the exercise, range of motion and muscle strengthening exercises prescribed by your doctor. Safety measures are also important to protect the joint from further injury. Any time any of these exercises cause you to have increased pain or swelling, decrease what you are doing until you are comfortable again and then slowly increase them. If you have problems or questions, call your caregiver or physical therapist for advice.   Rehabilitation is important following a joint replacement. After just a few days of immobilization, the muscles of the leg can become weakened and shrink (atrophy).  These exercises are designed to build up the tone and strength of the thigh and leg muscles and to improve motion. Often times heat used for twenty to thirty minutes before working out will loosen up your tissues and help with improving the range of motion but do not use heat for the first two weeks following surgery (sometimes heat can increase post-operative swelling).   These exercises can be done on a training (exercise) mat, on the floor, on a table or on a bed. Use whatever works the best and is most comfortable for you.    Use music or television while you are exercising so that the exercises are a pleasant break in your day.  This will make your life better with the exercises acting as a break in your routine that you can look forward to.   Perform all exercises about fifteen times, three times per day or as directed.  You should exercise both the operative leg and the other leg as well.  Exercises include:   Quad Sets - Tighten up the muscle on the front of the thigh (Quad) and hold for 5-10 seconds.   Straight Leg Raises - With your knee straight (if you were given a brace, keep it on), lift the leg to 60 degrees, hold  for 3 seconds, and slowly lower the leg.  Perform this exercise against resistance later as your leg gets stronger.  Leg Slides: Lying on your back, slowly slide your foot toward your buttocks, bending your knee up off the floor (only go as far as is comfortable). Then slowly slide your foot back down until your leg is flat on the floor again.  Angel Wings: Lying on your back spread your legs to the side as far apart as you can without causing discomfort.  Hamstring Strength:  Lying on your back, push your heel against the floor with your leg straight by tightening up the muscles of your buttocks.  Repeat, but this time bend your knee to a comfortable angle, and push your heel against the floor.  You may put a pillow under the heel to make it more comfortable if necessary.   A rehabilitation program following joint replacement surgery can speed recovery and prevent re-injury in the future due to weakened muscles. Contact your doctor or a physical therapist for more information on knee rehabilitation.    CONSTIPATION  Constipation is defined medically as fewer than three stools per week and severe constipation as less than one stool per week.  Even if you have a regular bowel pattern at home, your normal regimen is likely to be disrupted due to multiple reasons following surgery.  Combination of anesthesia, postoperative narcotics, change in appetite and fluid intake all can affect your bowels.   YOU MUST use at least one of the following options; they are listed in order of increasing strength to get the job done.  They are all available over the counter, and you may need to use some, POSSIBLY even all of these options:    Drink plenty of fluids (prune juice may be helpful) and high fiber foods Colace 100 mg by mouth twice a day  Senokot for constipation as directed and as needed Dulcolax (bisacodyl), take with full glass of water  Miralax (polyethylene glycol) once or twice a day as needed.  If  you have tried all these things and are unable to have a bowel movement in the first 3-4 days after surgery call either your surgeon or your primary doctor.    If you experience loose stools or diarrhea, hold the medications until you stool forms back up.  If your symptoms do not get better within 1 week or if they get worse, check with your doctor.  If you experience "the worst abdominal pain ever" or develop nausea or vomiting, please contact the office immediately for further recommendations for treatment.   ITCHING:  If you experience itching with your medications, try taking only a single pain pill, or even half a pain pill at a time.  You can also use Benadryl over the counter for itching or also to help with sleep.   TED HOSE STOCKINGS:  Use stockings on both legs  until for at least 2 weeks or as directed by physician office. They may be removed at night for sleeping.  MEDICATIONS:  See your medication summary on the "After Visit Summary" that nursing will review with you.  You may have some home medications which will be placed on hold until you complete the course of blood thinner medication.  It is important for you to complete the blood thinner medication as prescribed.   Blood clot prevention (DVT Prophylaxis): After surgery you are at an increased risk for a blood clot. you were prescribed a blood thinner, lovenox, to be taken daily for a total of 4 weeks from surgery to help reduce your risk of getting a blood clot. This will help prevent a blood clot. Signs of a pulmonary embolus (blood clot in the lungs) include sudden short of breath, feeling lightheaded or dizzy, chest pain with a deep breath, rapid pulse rapid breathing. Signs of a blood clot in your arms or legs include new unexplained swelling and cramping, warm, red or darkened skin around the painful area. Please call the office or 911 right away if these signs or symptoms develop.  PRECAUTIONS:  If you experience chest pain or  shortness of breath - call 911 immediately for transfer to the hospital emergency department.   If you develop a fever greater that 101 F, purulent drainage from wound, increased redness or drainage from wound, foul odor from the wound/dressing, or calf pain - CONTACT YOUR SURGEON.                                                   FOLLOW-UP APPOINTMENTS:  If you do not already have a post-op appointment, please call the office for an appointment to be seen by your surgeon.  Guidelines for how soon to be seen are listed in your "After Visit Summary", but are typically between 2-3 weeks after surgery.  OTHER INSTRUCTIONS:      POST-OPERATIVE OPIOID TAPER INSTRUCTIONS: It is important to wean off of your opioid medication as soon as possible. If you do not need pain medication after your surgery it is ok to stop day one. Opioids include: Codeine, Hydrocodone(Norco, Vicodin), Oxycodone(Percocet, oxycontin) and hydromorphone amongst others.  Long term and even short term use of opiods can cause: Increased pain response Dependence Constipation Depression Respiratory depression And more.  Withdrawal symptoms can include Flu like symptoms Nausea, vomiting And more Techniques to manage these symptoms Hydrate well Eat regular healthy meals Stay active Use relaxation techniques(deep breathing, meditating, yoga) Do Not substitute Alcohol to help with tapering If you have been on opioids for less than two weeks and do not have pain than it is ok to stop all together.  Plan to wean off of opioids This plan should start within one week post op of your joint replacement. Maintain the same interval or time between taking each dose and first decrease the dose.  Cut the total daily intake of opioids by one tablet each day Next start to increase the time between doses. The last dose that should be eliminated is the evening dose.   MAKE SURE YOU:  Understand these instructions.  Get help right  away if you are not doing well or get worse.    Thank you for letting us be a part of your medical care team.  It  is a privilege we respect greatly.  We hope these instructions will help you stay on track for a fast and full recovery!

## 2022-02-12 NOTE — Progress Notes (Signed)
     Subjective: Patient reports pain as well controlled when at rest. No new issues. Denies distal n/t. Aspiration results have been negative.  Objective:   VITALS:   Vitals:   02/12/22 0428 02/12/22 0834 02/12/22 0837 02/12/22 1059  BP: (!) 123/55 (!) 115/57  135/63  Pulse: 84 83  85  Resp: 18   18  Temp: 98.1 F (36.7 C) 98.1 F (36.7 C)  98.2 F (36.8 C)  TempSrc: Oral Oral  Oral  SpO2: 100% 97% 96% 98%  Weight:    85.7 kg  Height:    _0  (1.6 m)    Sensation intact distally Intact pulses distally Dorsiflexion/Plantar flexion intact Compartment soft   Lab Results  Component Value Date   WBC 7.9 02/12/2022   HGB 8.7 (L) 02/12/2022   HCT 29.7 (L) 02/12/2022   MCV 95.2 02/12/2022   PLT 172 02/12/2022   BMET    Component Value Date/Time   NA 137 02/12/2022 0313   NA 136 09/02/2021 1221   K 3.7 02/12/2022 0313   CL 97 (L) 02/12/2022 0313   CO2 31 02/12/2022 0313   GLUCOSE 120 (H) 02/12/2022 0313   BUN 60 (H) 02/12/2022 0313   BUN 85 (HH) 09/02/2021 1221   CREATININE 2.48 (H) 02/12/2022 0313   CREATININE 1.05 04/04/2014 1014   CALCIUM 9.4 02/12/2022 0313   CALCIUM 9.3 04/25/2021 0626   EGFR 11 (L) 09/02/2021 1221   GFRNONAA 20 (L) 02/12/2022 0313    Assessment/Plan: Day of Surgery   Principal Problem:   Pain from implanted hardware Active Problems:   HTN (hypertension), benign   Gastritis without bleeding   Cardiomyopathy ([place])   OSA (obstructive sleep apnea)   Mixed hypercholesterolemia and hypertriglyceridemia   CAD S/P percutaneous coronary angioplasty   Chronic diastolic CHF (congestive heart failure) (H[facility])   CHB (complete heart block) (HCC)   Atrial fibrillation (H[facility])   ESRD (end stage renal disease) ([place])   Diabetes mellitus without complication (HCC)   Thrombocytopenia ([place])   Irritable bowel syndrome with constipation   Congestive heart failure (CHF) (HCC)   Supplemental oxygen dependent   Hypertension   CHF (congestive heart  failure) ([place])   Diabetic renal disease ([place])   Pure hypercholesterolemia   Physical deconditioning   Chronic hypoxemic respiratory failure (H[facility])   Pressure injury of back, stage 2 ([place])  Plan for hardware removal and convesion intertroch nonunion to total hip arthroplasty. Possible abx spacer. Will determine based on intra-op assessment and frozen path specimen to rule out infection further. If infection will plan for abx hip spacer. IF no infection will plan for hip revision components pressfit vs cemented.  The risks benefits and alternatives were discussed with the patient including but not limited to the risks of nonoperative treatment, versus surgical intervention including infection, bleeding, nerve injury, periprosthetic fracture, the need for revision surgery, dislocation, leg length discrepancy, blood clots, cardiopulmonary complications, morbidity, mortality, among others, and they were willing to proceed.   Consent was signed by myself and the patient.  Left  leg was marked.   Paula Busenbark A Aceton Kinnear 02/12/2022, 12:30 PM   Charlies Constable, MD  Contact information:   [phone] 7am-5pm epic message Dr. Zachery Dakins, or call office for patient follow up: [phone] After hours and holidays please check [email or web address] for group call information for [facility]

## 2022-02-12 NOTE — Interval H&P Note (Signed)
The patient has been re-examined, and the chart reviewed, and there have been no interval changes to the documented history and physical.     The risks, benefits, and alternatives have been discussed at length with patient, and the patient is willing to proceed.  Left hip marked. Consent has been signed.

## 2022-02-12 NOTE — Transfer of Care (Signed)
Immediate Anesthesia Transfer of Care Note  Patient: [name]  Procedure(s) Performed: NAIL REMOVAL WITH CONVERSION TO TOTAL HIP (Left: Hip)  Patient Location: PACU  Anesthesia Type:General  Level of Consciousness: awake, alert , and oriented  Airway & Oxygen Therapy: Patient Spontanous Breathing and Patient connected to face mask oxygen  Post-op Assessment: Report given to RN and Post -op Vital signs reviewed and stable  Post vital signs: Reviewed and stable  Last Vitals:  Vitals Value Taken Time  BP 98/50 02/12/22 1730  Temp 36.4 C 02/12/22 1719  Pulse 88 02/12/22 1736  Resp 25 02/12/22 1736  SpO2 99 % 02/12/22 1736  Vitals shown include unvalidated device data.  Last Pain:  Vitals:   02/12/22 1719  TempSrc:   PainSc: Asleep      Patients Stated Pain Goal: 3 (89/34/06 8403)  Complications: No notable events documented.

## 2022-02-12 NOTE — Anesthesia Preprocedure Evaluation (Addendum)
Anesthesia Evaluation  Patient identified by MRN, date of birth, ID band Patient awake    Reviewed: Allergy & Precautions, NPO status , Patient's Chart, lab work & pertinent test results  Airway Mallampati: II  TM Distance: <3 FB Neck ROM: Full    Dental  (+) Edentulous Upper, Edentulous Lower   Pulmonary asthma , sleep apnea , former smoker   breath sounds clear to auscultation       Cardiovascular hypertension, Pt. on medications + CAD, + Cardiac Stents, + CABG and +CHF  + dysrhythmias Atrial Fibrillation + pacemaker + Cardiac Defibrillator  Rhythm:Regular Rate:Normal  Echo: 1. Technically difficult study. Left ventricular ejection fraction, by  estimation, is 45 to 50%. The left ventricle has mildly decreased  function. The left ventricle demonstrates regional wall motion  abnormalities (see scoring diagram/findings for  description). Apical hypokinesis. There is mild left ventricular  hypertrophy. Left ventricular diastolic parameters are indeterminate.   2. Right ventricular systolic function is normal. The right ventricular  size is normal. There is mildly elevated pulmonary artery systolic  pressure. The estimated right ventricular systolic pressure is 67.3 mmHg.   3. The mitral valve is degenerative. No evidence of mitral valve  regurgitation. Moderate mitral annular calcification.   4. The aortic valve was not well visualized. There is moderate  calcification of the aortic valve. Aortic valve regurgitation is trivial.  Mild aortic valve stenosis. Vmax 2.5 m/s, MG 13 mmHg, AVA 2.0 cm^2, DI  0.48   5. The inferior vena cava is dilated in size with <50% respiratory  variability, suggesting right atrial pressure of 15 mmHg.     Neuro/Psych CVA    GI/Hepatic Neg liver ROS, hiatal hernia,GERD  Medicated,,  Endo/Other  diabetes    Renal/GU Renal disease     Musculoskeletal  (+) Arthritis ,    Abdominal  (+) +  obese  Peds  Hematology   Anesthesia Other Findings   Reproductive/Obstetrics                             Anesthesia Physical Anesthesia Plan  ASA: 3  Anesthesia Plan: General   Post-op Pain Management: Ofirmev IV (intra-op)*   Induction: Intravenous  PONV Risk Score and Plan: 4 or greater and Ondansetron and Treatment may vary due to age or medical condition  Airway Management Planned: Oral ETT  Additional Equipment: None  Intra-op Plan:   Post-operative Plan: Possible Post-op intubation/ventilation  Informed Consent: I have reviewed the patients History and Physical, chart, labs and discussed the procedure including the risks, benefits and alternatives for the proposed anesthesia with the patient or authorized representative who has indicated his/her understanding and acceptance.       Plan Discussed with: CRNA  Anesthesia Plan Comments:         Anesthesia Quick Evaluation

## 2022-02-12 NOTE — Progress Notes (Signed)
PROGRESS NOTE    [name]  CWC:[id number] DOB: [birth year] DOA: 02/06/2022 PCP: Alicia Amel, MD   Brief Narrative: [name] is a 77 years old female with past medical history of CAD status post CABG, HFpEF, Obesity, Adrenal insufficiency, Diabetes mellitus, HTN, CKD stage III, OSA/obesity-hypoventilation syndrome on 3 L O2 chronically, complete heart block s/p PPM/ICD and recent history of left hip and left superior pubic ramus fracture who had a follow-up scan showed atrophic none union.  After Christmas patient started developing right hip pain and could not walk so was admitted hospital for revision hip surgery to total hip arthroplasty.  Imaging showed cephalomedullary screw  migration of the intertrochanteric nailing status post fall 2 days prior to admission.  At this time plan for surgical intervention by orthopedics.  Nephrology has been consulted as well.  Assessment & Plan:   Principal Problem:   Pain from implanted hardware Active Problems:   Cardiomyopathy (HCC)   OSA (obstructive sleep apnea)   Thrombocytopenia (HCC)   HTN (hypertension), benign   Gastritis without bleeding   Mixed hypercholesterolemia and hypertriglyceridemia   CAD S/P percutaneous coronary angioplasty   Chronic diastolic CHF (congestive heart failure) (HCC)   CHB (complete heart block) (HCC)   Atrial fibrillation (HCC)   ESRD (end stage renal disease) ([place])   Diabetes mellitus without complication (HCC)   Irritable bowel syndrome with constipation   Congestive heart failure (CHF) (HCC)   Supplemental oxygen dependent   Hypertension   CHF (congestive heart failure) (HCC)   Diabetic renal disease ([place])   Pure hypercholesterolemia   Physical deconditioning   Chronic hypoxemic respiratory failure (HCC)   Pressure injury of back, stage 2 (HCC)  Right hip pain/migration of hardware; left intertrochanteric hip fracture with nonunion and fixation failure. -Ortho has seen the patient and plan for  definitive surgical repair on 02/12/2022.  Plavix on hold.  Chronic hypoxemic respiratory failure secondary to COPD and asthma. Patient is on 3 L of oxygen at baseline.  Continue to monitor.  Continue bronchodilators.  CKD 4  Baseline creatinine of 2.5-3.2.  After surgery in February patient was on dialysis for 3 months..  Nephrology has been consulted.  Lasix on hold this morning.  Continue strict intake and output charting Daily weights..  Avoid nephrotoxic medication.  OSA (obstructive sleep apnea) Not on CPAP.   Physical deconditioning Consider for physical therapy after surgical intervention.   HTN (hypertension), benign Imdur.   Hypokalemia Replenished and improved.  Potassium of 3.7 at this time.   Gout On allopurinol   Chronic diastolic and systolic EF 76-16 % CHF (congestive heart failure) (HCC)/CAD -Stable, Continue with  Imdur, Crestor. Aspirin and Plavix on hold.  Compensated at this time.  CHB (complete heart block) [place] post permanent pacemaker placement.   GERD On sucralfate and Protonix.  Anemia of chronic kidney disease. On iron and folic acid.  Monitor hemoglobin closely.  Hypercholesterolemia Continue Crestor.  Constipation; Received suppository. Had BM.   Klebsiella pneumonia3 UTI. On Unasyn IV.   DVT prophylaxis: SCD  Code Status: Full code  Family Communication: Spoke with the patient's granddaughter at bedside.  Disposition Plan:  Status is: Inpatient Remains inpatient appropriate because: management of hip hardware migration.,  Awaiting for surgical intervention.  Consultants:  Orthopedics Nephrology  Procedures:  None so far  Antimicrobials:  Unasyn IV  Subjective: Today, patient was seen and examined at bedside.  Patient states that she has been having a lot of pain in her hip and did  not get her pain medication.  Denies any chest pain, fever, chills, nausea, vomiting, awaiting for surgical  intervention.  Objective: Vitals:   02/11/22 2014 02/12/22 0428 02/12/22 0834 02/12/22 0837  BP: (!) 116/43 (!) 123/55 (!) 115/57   Pulse: 89 84 83   Resp: 17 18    Temp: 98.6 F (37 C) 98.1 F (36.7 C) 98.1 F (36.7 C)   TempSrc: Oral Oral Oral   SpO2: 99% 100% 97% 96%  Weight:      Height:        Intake/Output Summary (Last 24 hours) at 02/12/2022 0903 Last data filed at 02/12/2022 0446 Gross per 24 hour  Intake --  Output 1850 ml  Net -1850 ml    Filed Weights   02/06/22 1435 02/06/22 2332  Weight: 85.7 kg 89.9 kg   Body mass index is 35.11 kg/m.  General: Obese built, not in obvious distress, on nasal cannula oxygen HENT:   No scleral pallor or icterus noted. Oral mucosa is moist.  Chest:  Clear breath sounds.  Diminished breath sounds bilaterally. No crackles or wheezes.  CVS: S1 &S2 heard. No murmur.  Regular rate and rhythm. Abdomen: Soft, nontender, nondistended.  Bowel sounds are heard.   Extremities: Tenderness over the hip. Psych: Alert, awake and oriented, normal mood CNS:  No cranial nerve deficits.  Power equal in all extremities.   Skin: Warm and dry.  No rashes noted.  Bilateral leg with dressing.   Data Reviewed: I have personally reviewed following labs and imaging studies  CBC: Recent Labs  Lab 02/06/22 1518 02/07/22 0323 02/08/22 0247 02/09/22 0236 02/10/22 0257 02/11/22 0732 02/12/22 0313  WBC 8.2 7.3 7.1 6.1 7.3 7.7 7.9  NEUTROABS 6.5 5.1  --   --   --   --   --   HGB 9.6* 8.6* 7.8* 8.2* 8.5* 9.1* 8.7*  HCT 31.8* 29.4* 26.8* 28.2* 28.9* 28.7* 29.7*  MCV 95.5 97.0 96.4 96.9 94.4 92.9 95.2  PLT 183 163 174 150 161 173 786    Basic Metabolic Panel: Recent Labs  Lab 02/08/22 0247 02/09/22 0236 02/10/22 0257 02/11/22 0732 02/12/22 0313  NA 139 140 137 138 137  K 3.6 3.0* 2.9* 3.3* 3.7  CL 101 99 95* 98 97*  CO2 '27 29 29 28 31  '$ GLUCOSE 111* 119* 118* 115* 120*  BUN 70* 70* 69* 62* 60*  CREATININE 3.00* 3.03* 2.88* 2.54* 2.48*   CALCIUM 8.9 9.2 9.3 9.4 9.4  MG  --   --  1.8  --   --   PHOS  --   --   --   --  3.8    GFR: Estimated Creatinine Clearance: 20.2 mL/min (A) (by C-G formula based on SCr of 2.48 mg/dL (H)). Liver Function Tests: Recent Labs  Lab 02/12/22 0313  ALBUMIN 2.3*   No results for input(s): "LIPASE", "AMYLASE" in the last 168 hours. No results for input(s): "AMMONIA" in the last 168 hours. Coagulation Profile: No results for input(s): "INR", "PROTIME" in the last 168 hours. Cardiac Enzymes: No results for input(s): "CKTOTAL", "CKMB", "CKMBINDEX", "TROPONINI" in the last 168 hours. BNP (last 3 results) No results for input(s): "PROBNP" in the last 8760 hours. HbA1C: No results for input(s): "HGBA1C" in the last 72 hours. CBG: Recent Labs  Lab 02/11/22 1231 02/11/22 2017 02/12/22 0835  GLUCAP 113* 155* 108*   Lipid Profile: No results for input(s): "CHOL", "HDL", "LDLCALC", "TRIG", "CHOLHDL", "LDLDIRECT" in the last 72 hours. Thyroid Function  Tests: No results for input(s): "TSH", "T4TOTAL", "FREET4", "T3FREE", "THYROIDAB" in the last 72 hours. Anemia Panel: No results for input(s): "VITAMINB12", "FOLATE", "FERRITIN", "TIBC", "IRON", "RETICCTPCT" in the last 72 hours.  Sepsis Labs: No results for input(s): "PROCALCITON", "LATICACIDVEN" in the last 168 hours.  Recent Results (from the past 240 hour(s))  Body fluid culture w Gram Stain     Status: None   Collection Time: 02/07/22  3:01 PM   Specimen: PATH Cytology Misc. fluid; Body Fluid  Result Value Ref Range Status   Specimen Description FLUID LEFT HIP  Final   Special Requests NONE  Final   Gram Stain   Final    FEW WBC PRESENT, PREDOMINANTLY MONONUCLEAR NO ORGANISMS SEEN    Culture   Final    NO GROWTH 3 DAYS Performed at [facility], [address] [place], [place] [address]    Report Status 02/10/2022 FINAL  Final  Urine Culture     Status: Abnormal   Collection Time: 02/09/22 11:49 AM   Specimen:  Urine, Clean Catch  Result Value Ref Range Status   Specimen Description URINE, CLEAN CATCH  Final   Special Requests   Final    NONE Performed at [facility], [place] [address], [place], [place] [address]    Culture >=100,000 COLONIES/mL KLEBSIELLA PNEUMONIAE (A)  Final   Report Status 02/11/2022 FINAL  Final   Organism ID, Bacteria KLEBSIELLA PNEUMONIAE (A)  Final      Susceptibility   Klebsiella pneumoniae - MIC*    AMPICILLIN RESISTANT Resistant     CEFAZOLIN <=4 SENSITIVE Sensitive     CEFEPIME <=0.12 SENSITIVE Sensitive     CEFTRIAXONE <=0.25 SENSITIVE Sensitive     CIPROFLOXACIN <=0.25 SENSITIVE Sensitive     GENTAMICIN <=1 SENSITIVE Sensitive     IMIPENEM <=0.25 SENSITIVE Sensitive     NITROFURANTOIN <=16 SENSITIVE Sensitive     TRIMETH/SULFA <=20 SENSITIVE Sensitive     AMPICILLIN/SULBACTAM <=2 SENSITIVE Sensitive     PIP/TAZO <=4 SENSITIVE Sensitive     * >=100,000 COLONIES/mL KLEBSIELLA PNEUMONIAE  Surgical pcr screen     Status: None   Collection Time: 02/12/22 12:23 AM   Specimen: Nasal Mucosa; Nasal Swab  Result Value Ref Range Status   MRSA, PCR NEGATIVE NEGATIVE Final   Staphylococcus aureus NEGATIVE NEGATIVE Final    Comment: (NOTE) The Xpert SA Assay (FDA approved for NASAL specimens in patients 33 years of age and older), is one component of a comprehensive surveillance program. It is not intended to diagnose infection nor to guide or monitor treatment. Performed at [facility], [place] [address], [place], [place] [address]      Radiology Studies: No results found.   Scheduled Meds:  allopurinol  100 mg Oral Daily   chlorhexidine  60 mL Topical Once   cycloSPORINE  1 drop Both Eyes Daily   ferrous sulfate  325 mg Oral Q breakfast   folic acid  1 mg Oral Daily   isosorbide mononitrate  90 mg Oral Daily   linaclotide  290 mcg Oral q morning   mometasone-formoterol  2 puff Inhalation BID   pantoprazole  40 mg Oral BID   polyethylene glycol   17 g Oral BID   povidone-iodine  2 Application Topical Once   rosuvastatin  20 mg Oral QPM   senna-docusate  1 tablet Oral BID   sucralfate  1 g Oral TID WC & HS   triamcinolone cream  Topical BID   Continuous Infusions:  ampicillin-sulbactam (UNASYN) IV 3 g (02/11/22 2254)   tranexamic acid     vancomycin       LOS: 6 days    Flora Lipps, MD [facility] If 7PM-7AM, please contact night-coverage [email or web address]  02/12/2022, 9:03 AM

## 2022-02-12 NOTE — Anesthesia Procedure Notes (Signed)
Procedure Name: Intubation Date/Time: 02/12/2022 12:54 PM  Performed by: Lowella Dell, CRNAPre-anesthesia Checklist: Patient identified, Emergency Drugs available, Suction available and Patient being monitored Patient Re-evaluated:Patient Re-evaluated prior to induction Oxygen Delivery Method: Circle System Utilized Preoxygenation: Pre-oxygenation with 100% oxygen Induction Type: IV induction Ventilation: Mask ventilation without difficulty Laryngoscope Size: Mac and 3 Grade View: Grade I Tube type: Oral Tube size: 7.0 mm Number of attempts: 1 Airway Equipment and Method: Stylet Placement Confirmation: ETT inserted through vocal cords under direct vision, positive ETCO2 and breath sounds checked- equal and bilateral Secured at: 21 cm Tube secured with: Tape Dental Injury: Teeth and Oropharynx as per pre-operative assessment

## 2022-02-13 DIAGNOSIS — I509 Heart failure, unspecified: Secondary | ICD-10-CM

## 2022-02-13 DIAGNOSIS — J9611 Chronic respiratory failure with hypoxia: Secondary | ICD-10-CM | POA: Diagnosis not present

## 2022-02-13 DIAGNOSIS — K581 Irritable bowel syndrome with constipation: Secondary | ICD-10-CM

## 2022-02-13 DIAGNOSIS — G4733 Obstructive sleep apnea (adult) (pediatric): Secondary | ICD-10-CM | POA: Diagnosis not present

## 2022-02-13 DIAGNOSIS — E1121 Type 2 diabetes mellitus with diabetic nephropathy: Secondary | ICD-10-CM

## 2022-02-13 DIAGNOSIS — T85848A Pain due to other internal prosthetic devices, implants and grafts, initial encounter: Secondary | ICD-10-CM | POA: Diagnosis not present

## 2022-02-13 DIAGNOSIS — E119 Type 2 diabetes mellitus without complications: Secondary | ICD-10-CM | POA: Diagnosis not present

## 2022-02-13 LAB — RENAL FUNCTION PANEL
Albumin: 2.8 g/dL — ABNORMAL LOW (ref 3.5–5.0)
Anion gap: 12 (ref 5–15)
BUN: 59 mg/dL — ABNORMAL HIGH (ref 8–23)
CO2: 27 mmol/L (ref 22–32)
Calcium: 8.8 mg/dL — ABNORMAL LOW (ref 8.9–10.3)
Chloride: 99 mmol/L (ref 98–111)
Creatinine, Ser: 2.81 mg/dL — ABNORMAL HIGH (ref 0.44–1.00)
GFR, Estimated: 17 mL/min — ABNORMAL LOW (ref >60)
Glucose, Bld: 163 mg/dL — ABNORMAL HIGH (ref 70–99)
Phosphorus: 5.4 mg/dL — ABNORMAL HIGH (ref 2.5–4.6)
Potassium: 4.2 mmol/L (ref 3.5–5.1)
Sodium: 138 mmol/L (ref 135–145)

## 2022-02-13 LAB — CBC
HCT: 22.2 % — ABNORMAL LOW (ref 36.0–46.0)
Hemoglobin: 6.7 g/dL — CL (ref 12.0–15.0)
MCH: 29 pg (ref 26.0–34.0)
MCHC: 30.2 g/dL (ref 30.0–36.0)
MCV: 96.1 fL (ref 80.0–100.0)
Platelets: 134 10*3/uL — ABNORMAL LOW (ref 150–400)
RBC: 2.31 MIL/uL — ABNORMAL LOW (ref 3.87–5.11)
RDW: 15.4 % (ref 11.5–15.5)
WBC: 8.1 10*3/uL (ref 4.0–10.5)
nRBC: 0 % (ref 0.0–0.2)

## 2022-02-13 LAB — BASIC METABOLIC PANEL
Anion gap: 12 (ref 5–15)
BUN: 62 mg/dL — ABNORMAL HIGH (ref 8–23)
CO2: 25 mmol/L (ref 22–32)
Calcium: 8.7 mg/dL — ABNORMAL LOW (ref 8.9–10.3)
Chloride: 100 mmol/L (ref 98–111)
Creatinine, Ser: 2.71 mg/dL — ABNORMAL HIGH (ref 0.44–1.00)
GFR, Estimated: 18 mL/min — ABNORMAL LOW (ref >60)
Glucose, Bld: 164 mg/dL — ABNORMAL HIGH (ref 70–99)
Potassium: 4.2 mmol/L (ref 3.5–5.1)
Sodium: 137 mmol/L (ref 135–145)

## 2022-02-13 LAB — GLUCOSE, CAPILLARY
Glucose-Capillary: 145 mg/dL — ABNORMAL HIGH (ref 70–99)
Glucose-Capillary: 144 mg/dL — ABNORMAL HIGH (ref 70–99)
Glucose-Capillary: 133 mg/dL — ABNORMAL HIGH (ref 70–99)
Glucose-Capillary: 143 mg/dL — ABNORMAL HIGH (ref 70–99)

## 2022-02-13 LAB — PREPARE RBC (CROSSMATCH)

## 2022-02-13 LAB — MAGNESIUM: Magnesium: 1.9 mg/dL (ref 1.7–2.4)

## 2022-02-13 MED ORDER — OXYCODONE HCL 5 MG PO TABS
5.0000 mg | ORAL_TABLET | ORAL | Status: DC | PRN
  Administered 2022-02-13: 10 mg via ORAL
  Administered 2022-02-13: 5 mg via ORAL
  Administered 2022-02-14 – 2022-02-19 (×22): 10 mg via ORAL
  Filled 2022-02-13 (×11): qty 2
  Filled 2022-02-13: qty 1
  Filled 2022-02-13 (×8): qty 2
  Filled 2022-02-13: qty 1
  Filled 2022-02-13 (×5): qty 2

## 2022-02-13 MED ORDER — ENOXAPARIN SODIUM 40 MG/0.4ML IJ SOSY: 40.0000 mg | PREFILLED_SYRINGE | INTRAMUSCULAR | Status: DC

## 2022-02-13 MED ORDER — SODIUM CHLORIDE 0.9% IV SOLUTION: Freq: Once | INTRAVENOUS | Status: AC

## 2022-02-13 MED ORDER — ENOXAPARIN SODIUM 30 MG/0.3ML IJ SOSY
30.0000 mg | PREFILLED_SYRINGE | INTRAMUSCULAR | Status: DC
  Administered 2022-02-13 – 2022-02-16 (×4): 30 mg via SUBCUTANEOUS
  Filled 2022-02-13 (×4): qty 0.3

## 2022-02-13 MED ORDER — METHOCARBAMOL 500 MG PO TABS
500.0000 mg | ORAL_TABLET | Freq: Four times a day (QID) | ORAL | Status: DC | PRN
  Administered 2022-02-13 – 2022-02-19 (×9): 500 mg via ORAL
  Filled 2022-02-13 (×9): qty 1

## 2022-02-13 NOTE — Plan of Care (Signed)

## 2022-02-13 NOTE — Progress Notes (Signed)
PROGRESS NOTE    [name]  WPY:[id number] DOB: November 22, 1945 DOA: 02/06/2022 PCP: Lujean Amel, MD   Brief Narrative: [name] is a 77 years old female with past medical history of CAD status post CABG, HFpEF, Obesity, Adrenal insufficiency, Diabetes mellitus, HTN, CKD stage III,  OSA/obesity-hypoventilation syndrome on 3 L O2 chronically, complete heart block s/p PPM/ICD and recent history of left hip and left superior pubic ramus fracture who had a follow-up scan showed atrophic none union.  After Christmas patient started developing right hip pain and could not walk so was admitted hospital for revision hip surgery to total hip arthroplasty.  Imaging showed cephalomedullary screw  migration of the intertrochanteric nailing status post fall 2 days prior to admission.  Patient was seen by orthopedics and underwent surgical intervention 02/12/2022.  Nephrology was also consulted for previous history of dialysis.  Assessment & Plan:   Principal Problem:   Pain from implanted hardware Active Problems:   Cardiomyopathy (HCC)   OSA (obstructive sleep apnea)   Thrombocytopenia (HCC)   HTN (hypertension), benign   Gastritis without bleeding   Mixed hypercholesterolemia and hypertriglyceridemia   CAD S/P percutaneous coronary angioplasty   Chronic diastolic CHF (congestive heart failure) (HCC)   CHB (complete heart block) (HCC)   Atrial fibrillation (HCC)   ESRD (end stage renal disease) ([place])   Diabetes mellitus without complication (HCC)   Irritable bowel syndrome with constipation   Congestive heart failure (CHF) (HCC)   Supplemental oxygen dependent   Hypertension   CHF (congestive heart failure) (HCC)   Diabetic renal disease ([place])   Pure hypercholesterolemia   Physical deconditioning   Chronic hypoxemic respiratory failure (HCC)   Pressure injury of back, stage 2 (HCC)  Right hip pain/migration of hardware; left intertrochanteric hip fracture with nonunion and fixation  failure. Orthopedics was consulted and patient underwent removal of left cephalomedullary femoral nail, conversion of left total hip arthroplasty, ORIF of greater trochanter fracture with trochanteric hook plate on 0/06/3974.  Further management as per orthopedics.  Chronic hypoxemic respiratory failure secondary to COPD and asthma. Patient is on 3 L of oxygen at baseline.  Continue to monitor.  Continue bronchodilators.  CKD 4  Baseline creatinine of 2.5-3.2.  After surgery in February, patient was on dialysis for 3 months..  Nephrology on board due to this.  Has been started on Lasix 80 twice daily.  Continue strict intake and output charting Daily weights..  Avoid nephrotoxic medication.  Follow nephrology recommendations.  OSA (obstructive sleep apnea) Not on CPAP.  Supplemental oxygen.   Physical deconditioning Physical therapy pending after surgery.   HTN (hypertension), benign Continue Imdur.  Blood pressure 108/50.   Hypokalemia Replenished and improved.  Latest potassium of 4.2 at this time.   Gout On allopurinol   Chronic diastolic and systolic EF 73-41 % CHF (congestive heart failure) (HCC)/CAD -Stable, Continue with  Imdur, Crestor. Aspirin and Plavix on hold.  Compensated at this time.  CHB (complete heart block) Status post permanent pacemaker placement.   GERD On sucralfate and Protonix.  Anemia of chronic kidney disease. On iron and folic acid.  Monitor hemoglobin closely.  Hypercholesterolemia Continue Crestor.  Constipation; will work on bowel regimen.  Klebsiella pneumonia UTI. On Unasyn IV.   DVT prophylaxis: SCD  Code Status: Full code  Family Communication: Spoke with the patient's son at bedside.  Disposition Plan:   Status is: Inpatient  Remains inpatient appropriate because: management of hip hardware migration.,  Status post surgical intervention.  PT  evaluation pending.  Consultants:  Orthopedics Nephrology  Procedures:  removal  of left cephalomedullary femoral nail, conversion of left total hip arthroplasty, ORIF of greater trochanter fracture with trochanteric hook plate on 7/0/6237 by Dr. Zachery Dakins  Antimicrobials:  Unasyn IV  Subjective: Today, patient was seen and examined at bedside.  Denies overt pain and pain control better than yesterday.  Denies any nausea vomiting fever chills or rigor.    Objective: Vitals:   02/12/22 2051 02/13/22 0137 02/13/22 0411 02/13/22 0800  BP: (!) 117/54 (!) 112/51 (!) 120/50 (!) 108/52  Pulse: 83 91 85 85  Resp: '18 18 18 19  '$ Temp: 98.4 F (36.9 C) 98.4 F (36.9 C) 98.4 F (36.9 C) 98.7 F (37.1 C)  TempSrc: Oral Oral Oral Oral  SpO2: 98% 100% 100% 100%  Weight:      Height:        Intake/Output Summary (Last 24 hours) at 02/13/2022 1249 Last data filed at 02/13/2022 1200 Gross per 24 hour  Intake 2255 ml  Output 1695 ml  Net 560 ml    Filed Weights   02/06/22 1435 02/06/22 2332 02/12/22 1059  Weight: 85.7 kg 89.9 kg 85.7 kg   Body mass index is 33.48 kg/m.   General: Obese built, on nasal cannula oxygen, alert awake and Communicative. HENT:   No scleral pallor or icterus noted. Oral mucosa is moist.  Chest:    Diminished breath sounds bilaterally. No crackles or wheezes.  CVS: S1 &S2 heard. No murmur.  Regular rate and rhythm. Abdomen: Soft, nontender, nondistended.  Bowel sounds are heard.   Extremities: Surgical site on the left hip with dressing and mild edema. Psych: Alert, awake and oriented, normal mood CNS:  No cranial nerve deficits.  Power equal in all extremities.   Skin: Warm and dry.  No rashes noted.  Bilateral leg with dressing.  Left hip surgical site dressing.  Data Reviewed: I have personally reviewed following labs and imaging studies  CBC: Recent Labs  Lab 02/06/22 1518 02/07/22 0323 02/08/22 0247 02/09/22 0236 02/10/22 0257 02/11/22 0732 02/12/22 0313 02/12/22 1540 02/12/22 1648 02/12/22 1729 02/13/22 0351  WBC 8.2 7.3    < > 6.1 7.3 7.7 7.9  --   --   --  8.1  NEUTROABS 6.5 5.1  --   --   --   --   --   --   --   --   --   HGB 9.6* 8.6*   < > 8.2* 8.5* 9.1* 8.7* 11.2* 10.5* 9.5* 6.7*  HCT 31.8* 29.4*   < > 28.2* 28.9* 28.7* 29.7* 33.0* 31.0* 28.0* 22.2*  MCV 95.5 97.0   < > 96.9 94.4 92.9 95.2  --   --   --  96.1  PLT 183 163   < > 150 161 173 172  --   --   --  134*   < > = values in this interval not displayed.    Basic Metabolic Panel: Recent Labs  Lab 02/10/22 0257 02/11/22 0732 02/12/22 0313 02/12/22 1540 02/12/22 1648 02/12/22 1729 02/13/22 0351 02/13/22 0352  NA 137 138 137 137 138 139 137 138  K 2.9* 3.3* 3.7 5.1 3.7 3.8 4.2 4.2  CL 95* 98 97* 98 101 101 100 99  CO2 '29 28 31  '$ --   --   --  25 27  GLUCOSE 118* 115* 120* 127* 129* 142* 164* 163*  BUN 69* 62* 60* 77* 49* 52* 62* 59*  CREATININE 2.88* 2.54* 2.48* 2.40* 2.40* 2.50* 2.71* 2.81*  CALCIUM 9.3 9.4 9.4  --   --   --  8.7* 8.8*  MG 1.8  --   --   --   --   --  1.9  --   PHOS  --   --  3.8  --   --   --   --  5.4*    GFR: Estimated Creatinine Clearance: 17.4 mL/min (A) (by C-G formula based on SCr of 2.81 mg/dL (H)). Liver Function Tests: Recent Labs  Lab 02/12/22 0313 02/13/22 0352  ALBUMIN 2.3* 2.8*    No results for input(s): "LIPASE", "AMYLASE" in the last 168 hours. No results for input(s): "AMMONIA" in the last 168 hours. Coagulation Profile: No results for input(s): "INR", "PROTIME" in the last 168 hours. Cardiac Enzymes: No results for input(s): "CKTOTAL", "CKMB", "CKMBINDEX", "TROPONINI" in the last 168 hours. BNP (last 3 results) No results for input(s): "PROBNP" in the last 8760 hours. HbA1C: No results for input(s): "HGBA1C" in the last 72 hours. CBG: Recent Labs  Lab 02/12/22 1103 02/12/22 1719 02/12/22 2050 02/13/22 0802 02/13/22 1211  GLUCAP 102* 139* 151* 145* 144*    Lipid Profile: No results for input(s): "CHOL", "HDL", "LDLCALC", "TRIG", "CHOLHDL", "LDLDIRECT" in the last 72  hours. Thyroid Function Tests: No results for input(s): "TSH", "T4TOTAL", "FREET4", "T3FREE", "THYROIDAB" in the last 72 hours. Anemia Panel: No results for input(s): "VITAMINB12", "FOLATE", "FERRITIN", "TIBC", "IRON", "RETICCTPCT" in the last 72 hours.  Sepsis Labs: No results for input(s): "PROCALCITON", "LATICACIDVEN" in the last 168 hours.  Recent Results (from the past 240 hour(s))  Body fluid culture w Gram Stain     Status: None   Collection Time: 02/07/22  3:01 PM   Specimen: PATH Cytology Misc. fluid; Body Fluid  Result Value Ref Range Status   Specimen Description FLUID LEFT HIP  Final   Special Requests NONE  Final   Gram Stain   Final    FEW WBC PRESENT, PREDOMINANTLY MONONUCLEAR NO ORGANISMS SEEN    Culture   Final    NO GROWTH 3 DAYS Performed at [facility], [address], [place], [place] [address]    Report Status 02/10/2022 FINAL  Final  Urine Culture     Status: Abnormal   Collection Time: 02/09/22 11:49 AM   Specimen: Urine, Clean Catch  Result Value Ref Range Status   Specimen Description URINE, CLEAN CATCH  Final   Special Requests   Final    NONE Performed at [facility], [place] [address] [place], [place] [address]    Culture >=100,000 COLONIES/mL KLEBSIELLA PNEUMONIAE (A)  Final   Report Status 02/11/2022 FINAL  Final   Organism ID, Bacteria KLEBSIELLA PNEUMONIAE (A)  Final      Susceptibility   Klebsiella pneumoniae - MIC*    AMPICILLIN RESISTANT Resistant     CEFAZOLIN <=4 SENSITIVE Sensitive     CEFEPIME <=0.12 SENSITIVE Sensitive     CEFTRIAXONE <=0.25 SENSITIVE Sensitive     CIPROFLOXACIN <=0.25 SENSITIVE Sensitive     GENTAMICIN <=1 SENSITIVE Sensitive     IMIPENEM <=0.25 SENSITIVE Sensitive     NITROFURANTOIN <=16 SENSITIVE Sensitive     TRIMETH/SULFA <=20 SENSITIVE Sensitive     AMPICILLIN/SULBACTAM <=2 SENSITIVE Sensitive     PIP/TAZO <=4 SENSITIVE Sensitive     * >=100,000 COLONIES/mL KLEBSIELLA PNEUMONIAE  Surgical  pcr screen     Status: None   Collection Time: 02/12/22 12:23  AM   Specimen: Nasal Mucosa; Nasal Swab  Result Value Ref Range Status   MRSA, PCR NEGATIVE NEGATIVE Final   Staphylococcus aureus NEGATIVE NEGATIVE Final    Comment: (NOTE) The Xpert SA Assay (FDA approved for NASAL specimens in patients 49 years of age and older), is one component of a comprehensive surveillance program. It is not intended to diagnose infection nor to guide or monitor treatment. Performed at [facility], [place] [address], [place], [place] [address]   Aerobic/Anaerobic Culture w Gram Stain (surgical/deep wound)     Status: None (Preliminary result)   Collection Time: 02/12/22  1:51 PM   Specimen: Soft Tissue, Other  Result Value Ref Range Status   Specimen Description TISSUE HIP LEFT  Final   Special Requests A  Final   Gram Stain   Final    RARE WBC PRESENT, PREDOMINANTLY MONONUCLEAR NO ORGANISMS SEEN CRITICAL RESULT CALLED TO, READ BACK BY AND VERIFIED WITH: RN Julieta Gutting [id number] AT 1437 BY CM    Culture   Final    NO GROWTH < 24 HOURS Performed at [facility], [place], [place], [place] [address]    Report Status PENDING  Incomplete  Aerobic/Anaerobic Culture w Gram Stain (surgical/deep wound)     Status: None (Preliminary result)   Collection Time: 02/12/22  2:08 PM   Specimen: Soft Tissue, Other  Result Value Ref Range Status   Specimen Description TISSUE LEFT FEMUR CANAL  Final   Special Requests B  Final   Gram Stain   Final    RARE WBC PRESENT,BOTH PMN AND MONONUCLEAR NO ORGANISMS SEEN Gram Stain Report Called to,Read Back By and Verified With: DR.MARSHONIE [id number] '@1510'$  FH    Culture   Final    NO GROWTH < 24 HOURS Performed at [facility], [place] [address] [place], [place] [address]    Report Status PENDING  Incomplete  Aerobic/Anaerobic Culture w Gram Stain (surgical/deep wound)     Status: None (Preliminary result)   Collection Time: 02/12/22  2:39 PM    Specimen: Soft Tissue, Other  Result Value Ref Range Status   Specimen Description TISSUE LEFT HIP  Final   Special Requests SAMPLE NO 1 PT ON VANC, UNASYN  Final   Gram Stain NO WBC SEEN NO ORGANISMS SEEN   Final   Culture   Final    NO GROWTH < 24 HOURS Performed at [facility], [place] [address], [place], [place] [address]    Report Status PENDING  Incomplete  Aerobic/Anaerobic Culture w Gram Stain (surgical/deep wound)     Status: None (Preliminary result)   Collection Time: 02/12/22  2:44 PM   Specimen: Soft Tissue, Other  Result Value Ref Range Status   Specimen Description TISSUE LEFT HIP  Final   Special Requests SAMPLE NO 2 PT ON VANC, UNASYN  Final   Gram Stain NO WBC SEEN NO ORGANISMS SEEN   Final   Culture   Final    NO GROWTH < 24 HOURS Performed at [facility], [place] [address], [place],  [address]    Report Status PENDING  Incomplete  Aerobic/Anaerobic Culture w Gram Stain (surgical/deep wound)     Status: None (Preliminary result)   Collection Time: 02/12/22  2:44 PM   Specimen: Soft Tissue, Other  Result Value Ref Range Status   Specimen Description TISSUE LEFT HIP  Final   Special Requests SAMPLE NO 3 PT ON VANC ,  UNASYN  Final   Gram Stain NO WBC SEEN NO ORGANISMS SEEN   Final   Culture   Final    NO GROWTH < 24 HOURS Performed at [facility], [place] [address], [place],  [address]    Report Status PENDING  Incomplete     Radiology Studies: DG HIP UNILAT W OR W/O PELVIS 2-3 VIEWS LEFT  Result Date: 02/12/2022 CLINICAL DATA:  Status post nail removal with conversion to total left hip arthroplasty. Postoperative. EXAM: DG HIP (WITH OR WITHOUT PELVIS) 2-3V LEFT COMPARISON:  Left humeral radiographs 02/05/2022 FINDINGS: Interval removal of the prior left femoral long intramedullary nail and femoral neck screw. New total left hip arthroplasty hardware with moderately long femoral stem. Proximal femoral greater trochanter and  diaphyseal upper plate with 2 associated cerclage wires around the femoral stem prosthesis. Near anatomic alignment with unchanged medial displacement of the lesser trochanter. Moderate right femoroacetabular joint space narrowing. IMPRESSION: Interval removal of the prior left femoral long intramedullary nail and femoral neck screw. New total left hip arthroplasty hardware with moderately long femoral stem, cerclage wires, and proximal lateral femoral hook plate. Electronically Signed   By: Yvonne Kendall M.D.   On: 02/12/2022 18:15   DG HIP UNILAT WITH PELVIS 1V LEFT  Result Date: 02/12/2022 CLINICAL DATA:  Removal of femoral nail and conversion to total left hip arthroplasty. EXAM: DG HIP (WITH OR WITHOUT PELVIS) 1V*L* COMPARISON:  Left femur radiographs 02/06/2022, CT left femur 09/10/2021 FINDINGS: Images were performed intraoperatively without the presence of a radiologist. The patient is undergoing removal of the prior long femoral intramedullary nail. Placement of new left hip arthroplasty hardware with moderately long femoral stem. Two proximal femoral stem and cerclage wires with associated lateral greater trochanter proximal femoral plate. No hardware complication is seen. Total fluoroscopy images: 12 Total fluoroscopy time: 34 seconds Total dose: Radiation Exposure Index (as provided by the fluoroscopic device): 7.37 mGy air Kerma Please see intraoperative findings for further detail. IMPRESSION: Intraoperative fluoroscopic guidance for removal of prior left femoral nail and placement of left hip arthroplasty. Electronically Signed   By: Yvonne Kendall M.D.   On: 02/12/2022 16:32   DG C-Arm 1-60 Min-No Report  Result Date: 02/12/2022 Fluoroscopy was utilized by the requesting physician.  No radiographic interpretation.   DG C-Arm 1-60 Min-No Report  Result Date: 02/12/2022 Fluoroscopy was utilized by the requesting physician.  No radiographic interpretation.   DG C-Arm 1-60 Min-No  Report  Result Date: 02/12/2022 Fluoroscopy was utilized by the requesting physician.  No radiographic interpretation.     Scheduled Meds:  allopurinol  100 mg Oral Daily   cycloSPORINE  1 drop Both Eyes Daily   ferrous sulfate  325 mg Oral Q breakfast   folic acid  1 mg Oral Daily   furosemide  80 mg Oral BID   isosorbide mononitrate  90 mg Oral Daily   linaclotide  290 mcg Oral q morning   mometasone-formoterol  2 puff Inhalation BID   pantoprazole  40 mg Oral BID   polyethylene glycol  17 g Oral BID   rosuvastatin  20 mg Oral QPM   senna-docusate  1 tablet Oral BID   sucralfate  1 g Oral TID WC & HS   triamcinolone cream   Topical BID   Continuous Infusions:  ampicillin-sulbactam (UNASYN) IV 3 g (02/13/22 1020)     LOS: 7 days    Flora Lipps, MD [facility] If 7PM-7AM, please contact night-coverage [email or web address]  02/13/2022, 12:49 PM

## 2022-02-13 NOTE — Progress Notes (Signed)
     Subjective: Patient reports pain as moderate. Denies distal n/t. Denies CP or SOB. Discussed intra-op findings, post op plan and WB restrictions, and plan for mobilization with PT today. Hgb 6.7 this morning.  Objective:   VITALS:   Vitals:   02/12/22 2049 02/12/22 2051 02/13/22 0137 02/13/22 0411  BP:  (!) 117/54 (!) 112/51 (!) 120/50  Pulse:  83 91 85  Resp:  _0 Temp:  98.4 F (36.9 C) 98.4 F (36.9 C) 98.4 F (36.9 C)  TempSrc:  Oral Oral Oral  SpO2: 97% 98% 100% 100%  Weight:      Height:        Sensation intact distally Intact pulses distally Dorsiflexion/Plantar flexion intact Incision: dressing C/D/I Compartment soft   Lab Results  Component Value Date   WBC 8.1 02/13/2022   HGB 6.7 (LL) 02/13/2022   HCT 22.2 (L) 02/13/2022   MCV 96.1 02/13/2022   PLT 134 (L) 02/13/2022   BMET    Component Value Date/Time   NA 138 02/13/2022 0352   NA 136 09/02/2021 1221   K 4.2 02/13/2022 0352   CL 99 02/13/2022 0352   CO2 27 02/13/2022 0352   GLUCOSE 163 (H) 02/13/2022 0352   BUN 59 (H) 02/13/2022 0352   BUN 85 (HH) 09/02/2021 1221   CREATININE 2.81 (H) 02/13/2022 0352   CREATININE 1.05 04/04/2014 1014   CALCIUM 8.8 (L) 02/13/2022 0352   CALCIUM 9.3 04/25/2021 0626   EGFR 11 (L) 09/02/2021 1221   GFRNONAA 17 (L) 02/13/2022 0352      Xray: THA revision components in good position, no adverse features.  Assessment/Plan: 1 Day Post-Op   Principal Problem:   Pain from implanted hardware Active Problems:   HTN (hypertension), benign   Gastritis without bleeding   Cardiomyopathy (HCC)   OSA (obstructive sleep apnea)   Mixed hypercholesterolemia and hypertriglyceridemia   CAD S/P percutaneous coronary angioplasty   Chronic diastolic CHF (congestive heart failure) (HCC)   CHB (complete heart block) (HCC)   Atrial fibrillation (HCC)   ESRD (end stage renal disease) ([place])   Diabetes mellitus without complication (HCC)   Thrombocytopenia (HCC)    Irritable bowel syndrome with constipation   Congestive heart failure (CHF) (HCC)   Supplemental oxygen dependent   Hypertension   CHF (congestive heart failure) (HCC)   Diabetic renal disease ([place])   Pure hypercholesterolemia   Physical deconditioning   Chronic hypoxemic respiratory failure (HCC)   Pressure injury of back, stage 2 (HCC)  S/p L hip hardware removal conversion to THA and ORIF greater trochanter fracture 02/12/22   Post op recs: WB: 50% WBAT LLE, posterior hip precautions x 6 weeks, no active abduction. Abx: Vancomycin + Unasyn continue abx pending intra-op cxs Imaging: PACU pelvis Xray Dressing: Aquacell, keep intact until follow up DVT prophylaxis: Lovenox daily starting postop day 1 x 4 weeks Follow up: 2 weeks after surgery for a wound check with Dr. Zachery Dakins at [facility].  Address: [address] [place], [place], [place] [address]  Office Phone: [phone]    Willaim Sheng 02/13/2022, 7:26 AM   Charlies Constable, MD  Contact information:   [phone] 7am-5pm epic message Dr. Zachery Dakins, or call office for patient follow up: [phone] After hours and holidays please check [email or web address] for group call information for [facility]

## 2022-02-13 NOTE — Progress Notes (Signed)
Admit: 02/06/2022 LOS: 7  82F with advanced CKD and s/p L hip fracture nonunion s/p surgical repair 02/12/22  Subjective:  In OR yesterday for femur fx repair  SCr 2.5 to 2.8, K 4.2 1.1L UOP yesterday On BID lasix Hb 6.7, to rec pRBC today Son at bedside, she's doing well today  01/03 0701 - 01/04 0700 In: 2015 [I.V.:1100; Blood:315; IV FUXNATFTD:322] Out: 0254 [YHCWC:3762; Blood:650]  Filed Weights   02/06/22 1435 02/06/22 2332 02/12/22 1059  Weight: 85.7 kg 89.9 kg 85.7 kg    Scheduled Meds:  allopurinol  100 mg Oral Daily   cycloSPORINE  1 drop Both Eyes Daily   ferrous sulfate  325 mg Oral Q breakfast   folic acid  1 mg Oral Daily   furosemide  80 mg Oral BID   isosorbide mononitrate  90 mg Oral Daily   linaclotide  290 mcg Oral q morning   mometasone-formoterol  2 puff Inhalation BID   pantoprazole  40 mg Oral BID   polyethylene glycol  17 g Oral BID   rosuvastatin  20 mg Oral QPM   senna-docusate  1 tablet Oral BID   sucralfate  1 g Oral TID WC & HS   triamcinolone cream   Topical BID   Continuous Infusions:  ampicillin-sulbactam (UNASYN) IV 3 g (02/13/22 1020)   PRN Meds:.acetaminophen **OR** acetaminophen, albuterol, calcium carbonate, fluticasone, hydrALAZINE, HYDROmorphone (DILAUDID) injection, hydrOXYzine, ondansetron **OR** ondansetron (ZOFRAN) IV, simethicone  Current Labs: reviewed   Physical Exam:  Blood pressure (!) 108/52, pulse 85, temperature 98.7 F (37.1 C), temperature source Oral, resp. rate 19, height '5\' 3"'$  (1.6 m), weight 85.7 kg, SpO2 100 %. NAD RRR CTAB Obese S/nt/nd  A CKD4 BL SCr 2.5 to 3.2, w/in baseline S/p surgery for nonunion of L femoral neck fracture 02/12/22 Chronic HFpEF Chronic O2 dep't resp failure Klebsiella UTI on unasyn S/p PPM for CHF  P Trend SCr and UOP for next 24-48h to see if any kidney injury during surgery OK to cont BID lasix for now Medication Issues; Preferred narcotic agents for pain control are  hydromorphone, fentanyl, and methadone. Morphine should not be used.  Baclofen should be avoided Avoid oral sodium phosphate and magnesium citrate based laxatives / bowel preps    Pearson Grippe MD 02/13/2022, 10:45 AM  Recent Labs  Lab 02/12/22 0313 02/12/22 1540 02/12/22 1729 02/13/22 0351 02/13/22 0352  NA 137   < > 139 137 138  K 3.7   < > 3.8 4.2 4.2  CL 97*   < > 101 100 99  CO2 31  --   --  25 27  GLUCOSE 120*   < > 142* 164* 163*  BUN 60*   < > 52* 62* 59*  CREATININE 2.48*   < > 2.50* 2.71* 2.81*  CALCIUM 9.4  --   --  8.7* 8.8*  PHOS 3.8  --   --   --  5.4*   < > = values in this interval not displayed.   Recent Labs  Lab 02/06/22 1518 02/07/22 0323 02/08/22 0247 02/11/22 0732 02/12/22 0313 02/12/22 1540 02/12/22 1648 02/12/22 1729 02/13/22 0351  WBC 8.2 7.3   < > 7.7 7.9  --   --   --  8.1  NEUTROABS 6.5 5.1  --   --   --   --   --   --   --   HGB 9.6* 8.6*   < > 9.1* 8.7*   < > 10.5*  9.5* 6.7*  HCT 31.8* 29.4*   < > 28.7* 29.7*   < > 31.0* 28.0* 22.2*  MCV 95.5 97.0   < > 92.9 95.2  --   --   --  96.1  PLT 183 163   < > 173 172  --   --   --  134*   < > = values in this interval not displayed.

## 2022-02-13 NOTE — Progress Notes (Signed)
Lab just called with a critical lab value: hemoglobin 6.7.  Contacted on call through [facility].  Plan is to transfuse PRBC's during HD today.  On call MD will pass information to rounding day MD.  Patient is asymptomatic.

## 2022-02-13 NOTE — Anesthesia Postprocedure Evaluation (Signed)
Anesthesia Post Note  Patient: [name]  Procedure(s) Performed: NAIL REMOVAL WITH CONVERSION TO TOTAL HIP (Left: Hip)     Patient location during evaluation: PACU Anesthesia Type: General Level of consciousness: awake and alert Pain management: pain level controlled Vital Signs Assessment: post-procedure vital signs reviewed and stable Respiratory status: spontaneous breathing, nonlabored ventilation, respiratory function stable and patient connected to nasal cannula oxygen Cardiovascular status: blood pressure returned to baseline and stable Postop Assessment: no apparent nausea or vomiting Anesthetic complications: no  No notable events documented.  Last Vitals:  Vitals:   02/13/22 0411 02/13/22 0800  BP: (!) 120/50 (!) 108/52  Pulse: 85 85  Resp: 18 19  Temp: 36.9 C 37.1 C  SpO2: 100% 100%    Last Pain:  Vitals:   02/13/22 0800  TempSrc: Oral  PainSc: 10-Worst pain ever                 Jearld Hemp S

## 2022-02-14 DIAGNOSIS — R5381 Other malaise: Secondary | ICD-10-CM

## 2022-02-14 DIAGNOSIS — E119 Type 2 diabetes mellitus without complications: Secondary | ICD-10-CM | POA: Diagnosis not present

## 2022-02-14 DIAGNOSIS — G4733 Obstructive sleep apnea (adult) (pediatric): Secondary | ICD-10-CM | POA: Diagnosis not present

## 2022-02-14 DIAGNOSIS — T85848A Pain due to other internal prosthetic devices, implants and grafts, initial encounter: Secondary | ICD-10-CM | POA: Diagnosis not present

## 2022-02-14 DIAGNOSIS — J9611 Chronic respiratory failure with hypoxia: Secondary | ICD-10-CM | POA: Diagnosis not present

## 2022-02-14 LAB — RENAL FUNCTION PANEL
Albumin: 2.4 g/dL — ABNORMAL LOW (ref 3.5–5.0)
Anion gap: 11 (ref 5–15)
BUN: 71 mg/dL — ABNORMAL HIGH (ref 8–23)
CO2: 27 mmol/L (ref 22–32)
Calcium: 8.4 mg/dL — ABNORMAL LOW (ref 8.9–10.3)
Chloride: 99 mmol/L (ref 98–111)
Creatinine, Ser: 2.98 mg/dL — ABNORMAL HIGH (ref 0.44–1.00)
GFR, Estimated: 16 mL/min — ABNORMAL LOW (ref >60)
Glucose, Bld: 129 mg/dL — ABNORMAL HIGH (ref 70–99)
Phosphorus: 4.4 mg/dL (ref 2.5–4.6)
Potassium: 3.7 mmol/L (ref 3.5–5.1)
Sodium: 137 mmol/L (ref 135–145)

## 2022-02-14 LAB — GLUCOSE, CAPILLARY
Glucose-Capillary: 120 mg/dL — ABNORMAL HIGH (ref 70–99)
Glucose-Capillary: 145 mg/dL — ABNORMAL HIGH (ref 70–99)
Glucose-Capillary: 164 mg/dL — ABNORMAL HIGH (ref 70–99)
Glucose-Capillary: 139 mg/dL — ABNORMAL HIGH (ref 70–99)

## 2022-02-14 LAB — CBC
HCT: 21.1 % — ABNORMAL LOW (ref 36.0–46.0)
Hemoglobin: 6.9 g/dL — CL (ref 12.0–15.0)
MCH: 29.9 pg (ref 26.0–34.0)
MCHC: 32.7 g/dL (ref 30.0–36.0)
MCV: 91.3 fL (ref 80.0–100.0)
Platelets: 136 10*3/uL — ABNORMAL LOW (ref 150–400)
RBC: 2.31 MIL/uL — ABNORMAL LOW (ref 3.87–5.11)
RDW: 15.6 % — ABNORMAL HIGH (ref 11.5–15.5)
WBC: 9.7 10*3/uL (ref 4.0–10.5)
nRBC: 0 % (ref 0.0–0.2)

## 2022-02-14 LAB — PREPARE RBC (CROSSMATCH)

## 2022-02-14 LAB — MAGNESIUM: Magnesium: 1.9 mg/dL (ref 1.7–2.4)

## 2022-02-14 MED ORDER — SODIUM CHLORIDE 0.9% IV SOLUTION: Freq: Once | INTRAVENOUS | Status: AC

## 2022-02-14 NOTE — Progress Notes (Signed)
PROGRESS NOTE    [name]  XQJ:[id number] DOB: Aug 24, 1945 DOA: 02/06/2022 PCP: Lujean Amel, MD   Brief Narrative: [name] is a 77 years old female with past medical history of CAD status post CABG, HFpEF, Obesity, adrenal insufficiency, Diabetes mellitus, HTN, CKD stage III,  OSA/obesity-hypoventilation syndrome on 3 L O2 chronically, complete heart block s/p PPM/ICD and recent history of left hip and left superior pubic ramus fracture who had a follow-up scan showed atrophic none union.  After Christmas patient started developing right hip pain and could not walk so was admitted hospital for  total hip arthroplasty.  Imaging showed cephalomedullary screw  migration of the intertrochanteric nailing status post fall 2 days prior to admission.  Patient was seen by orthopedics and underwent surgical intervention 02/12/2022.  Nephrology was also consulted for previous history of dialysis.  Postoperatively, patient required 2 units of packed RBC.  Physical therapy pending at this time.  Assessment & Plan:   Principal Problem:   Pain from implanted hardware Active Problems:   Cardiomyopathy (HCC)   OSA (obstructive sleep apnea)   Thrombocytopenia (HCC)   HTN (hypertension), benign   Gastritis without bleeding   Mixed hypercholesterolemia and hypertriglyceridemia   CAD S/P percutaneous coronary angioplasty   Chronic diastolic CHF (congestive heart failure) (HCC)   CHB (complete heart block) (HCC)   Atrial fibrillation (HCC)   ESRD (end stage renal disease) ([place])   Diabetes mellitus without complication (HCC)   Irritable bowel syndrome with constipation   Congestive heart failure (CHF) (HCC)   Supplemental oxygen dependent   Hypertension   CHF (congestive heart failure) (HCC)   Diabetic renal disease ([place])   Pure hypercholesterolemia   Physical deconditioning   Chronic hypoxemic respiratory failure (HCC)   Pressure injury of back, stage 2 (HCC)  Right hip pain/migration of  hardware; left intertrochanteric hip fracture with nonunion and fixation failure. Orthopedics was consulted and patient underwent removal of left cephalomedullary femoral nail, conversion of left total hip arthroplasty, ORIF of greater trochanter fracture with trochanteric hook plate on 05/15/8183.  Further management as per orthopedics.  Intraoperative cultures negative in 2 days.  Chronic hypoxemic respiratory failure secondary to COPD and asthma. Patient is on 3 L of oxygen at baseline. Continue bronchodilators.  Acute postoperative anemia.  Patient received 1 unit of packed RBC yesterday.  Will transfuse 1 more unit of packed RBC today.  Hemoglobin today at 6.9.  CKD 4  Baseline creatinine of 2.5-3.2.  After surgery in February, patient was on dialysis for 3 months..  Nephrology on board due to this.  Has been started on Lasix 80 twice daily.  Continue strict intake and output charting Daily weights..  Avoid nephrotoxic medication.  Follow nephrology recommendations.  Latest creatinine of 2.9.  OSA (obstructive sleep apnea) Not on CPAP.     Physical deconditioning Physical therapy pending after surgery.   HTN (hypertension), benign Continue Imdur with holding parameters.  Latest blood pressure at 105/49, has been having episodes of hypotension.   Hypokalemia Replenished and improved.  Latest potassium of 3.7.   Gout On allopurinol   Chronic diastolic and systolic EF 63-14 % CHF (congestive heart failure) (HCC)/CAD -Stable, Continue with  Imdur, Crestor. Aspirin and Plavix on hold due to postoperative anemia requiring blood transfusion x2 units..  CHF is compensated at this time.  Resume aspirin Plavix when okay with surgery.  CHB (complete heart block) Status post permanent pacemaker placement.   GERD On sucralfate and Protonix.  Anemia of chronic  kidney disease. On iron and folic acid as outpatient.  Hypercholesterolemia Continue Crestor.  Constipation; continue bowel  regimen.  Klebsiella pneumonia UTI. On Unasyn IV until Sunday, 02/16/2022.   DVT prophylaxis: SCD  Code Status: Full code  Family Communication: Spoke with the patient's son at bedside.  Disposition Plan: At this time, will need PT evaluation.  Status is: Inpatient  Remains inpatient appropriate because: management of hip hardware migration.,  Status post surgical intervention.  PT evaluation pending.  Consultants:  Orthopedics Nephrology  Procedures:  removal of left cephalomedullary femoral nail, conversion of left total hip arthroplasty, ORIF of greater trochanter fracture with trochanteric hook plate on 07/19/319 by Dr. Zachery Dakins Transfusion of packed RBC 2 units.  Antimicrobials:  Unasyn IV  Subjective: Today, patient was seen and examined at bedside.  Patient denies any nausea, vomiting, fever, chills or rigor.  Complains of mild pain in the leg and back.   Objective: Vitals:   02/14/22 0835 02/14/22 0858 02/14/22 1052 02/14/22 1108  BP: (!) 109/45  (!) 105/56 (!) 109/44  Pulse: 86 84 86 86  Resp: '18 16 18 18  '$ Temp: 98.3 F (36.8 C)  98.4 F (36.9 C) 98.8 F (37.1 C)  TempSrc:   Oral Oral  SpO2: 100% 100% 94%   Weight:      Height:        Intake/Output Summary (Last 24 hours) at 02/14/2022 1212 Last data filed at 02/14/2022 1158 Gross per 24 hour  Intake 1434 ml  Output --  Net 1434 ml    Filed Weights   02/06/22 1435 02/06/22 2332 02/12/22 1059  Weight: 85.7 kg 89.9 kg 85.7 kg   Body mass index is 33.48 kg/m.   General: Obese built, alert awake and Communicative, on nasal cannula oxygen, left periorbital bruise.   HENT:   No scleral pallor or icterus noted. Oral mucosa is moist.  Chest:    Diminished breath sounds bilaterally. No crackles or wheezes.  CVS: S1 &S2 heard. No murmur.  Regular rate and rhythm. Abdomen: Soft, nontender, nondistended.  Bowel sounds are heard.   Extremities: Left hip surgical site with mild edema and dressing.   Psych:  Alert, awake and oriented, normal mood CNS:  No cranial nerve deficits.  Power equal in all extremities.   Skin: Warm and dry.  No rashes noted.  Lateral leg with dressing, left hip surgical site with dressing.    Data Reviewed: I have personally reviewed following labs and imaging studies  CBC: Recent Labs  Lab 02/10/22 0257 02/11/22 0732 02/12/22 0313 02/12/22 1540 02/12/22 1648 02/12/22 1729 02/13/22 0351 02/14/22 0456  WBC 7.3 7.7 7.9  --   --   --  8.1 9.7  HGB 8.5* 9.1* 8.7* 11.2* 10.5* 9.5* 6.7* 6.9*  HCT 28.9* 28.7* 29.7* 33.0* 31.0* 28.0* 22.2* 21.1*  MCV 94.4 92.9 95.2  --   --   --  96.1 91.3  PLT 161 173 172  --   --   --  134* 136*    Basic Metabolic Panel: Recent Labs  Lab 02/10/22 0257 02/11/22 0732 02/12/22 0313 02/12/22 1540 02/12/22 1648 02/12/22 1729 02/13/22 0351 02/13/22 0352 02/14/22 0458  NA 137 138 137   < > 138 139 137 138 137  K 2.9* 3.3* 3.7   < > 3.7 3.8 4.2 4.2 3.7  CL 95* 98 97*   < > 101 101 100 99 99  CO2 '29 28 31  '$ --   --   --  $'25 27 27  'z$ GLUCOSE 118* 115* 120*   < > 129* 142* 164* 163* 129*  BUN 69* 62* 60*   < > 49* 52* 62* 59* 71*  CREATININE 2.88* 2.54* 2.48*   < > 2.40* 2.50* 2.71* 2.81* 2.98*  CALCIUM 9.3 9.4 9.4  --   --   --  8.7* 8.8* 8.4*  MG 1.8  --   --   --   --   --  1.9  --   --   PHOS  --   --  3.8  --   --   --   --  5.4* 4.4   < > = values in this interval not displayed.    GFR: Estimated Creatinine Clearance: 16.4 mL/min (A) (by C-G formula based on SCr of 2.98 mg/dL (H)). Liver Function Tests: Recent Labs  Lab 02/12/22 0313 02/13/22 0352 02/14/22 0458  ALBUMIN 2.3* 2.8* 2.4*    No results for input(s): "LIPASE", "AMYLASE" in the last 168 hours. No results for input(s): "AMMONIA" in the last 168 hours. Coagulation Profile: No results for input(s): "INR", "PROTIME" in the last 168 hours. Cardiac Enzymes: No results for input(s): "CKTOTAL", "CKMB", "CKMBINDEX", "TROPONINI" in the last 168 hours. BNP  (last 3 results) No results for input(s): "PROBNP" in the last 8760 hours. HbA1C: No results for input(s): "HGBA1C" in the last 72 hours. CBG: Recent Labs  Lab 02/13/22 1211 02/13/22 1719 02/13/22 2011 02/14/22 0836 02/14/22 1116  GLUCAP 144* 133* 143* 120* 145*    Lipid Profile: No results for input(s): "CHOL", "HDL", "LDLCALC", "TRIG", "CHOLHDL", "LDLDIRECT" in the last 72 hours. Thyroid Function Tests: No results for input(s): "TSH", "T4TOTAL", "FREET4", "T3FREE", "THYROIDAB" in the last 72 hours. Anemia Panel: No results for input(s): "VITAMINB12", "FOLATE", "FERRITIN", "TIBC", "IRON", "RETICCTPCT" in the last 72 hours.  Sepsis Labs: No results for input(s): "PROCALCITON", "LATICACIDVEN" in the last 168 hours.  Recent Results (from the past 240 hour(s))  Body fluid culture w Gram Stain     Status: None   Collection Time: 02/07/22  3:01 PM   Specimen: PATH Cytology Misc. fluid; Body Fluid  Result Value Ref Range Status   Specimen Description FLUID LEFT HIP  Final   Special Requests NONE  Final   Gram Stain   Final    FEW WBC PRESENT, PREDOMINANTLY MONONUCLEAR NO ORGANISMS SEEN    Culture   Final    NO GROWTH 3 DAYS Performed at [facility], [address], [place], [place] [address]    Report Status 02/10/2022 FINAL  Final  Urine Culture     Status: Abnormal   Collection Time: 02/09/22 11:49 AM   Specimen: Urine, Clean Catch  Result Value Ref Range Status   Specimen Description URINE, CLEAN CATCH  Final   Special Requests   Final    NONE Performed at [facility], [place] [address], [place], [place] [address]    Culture >=100,000 COLONIES/mL KLEBSIELLA PNEUMONIAE (A)  Final   Report Status 02/11/2022 FINAL  Final   Organism ID, Bacteria KLEBSIELLA PNEUMONIAE (A)  Final      Susceptibility   Klebsiella pneumoniae - MIC*    AMPICILLIN RESISTANT Resistant     CEFAZOLIN <=4 SENSITIVE Sensitive     CEFEPIME <=0.12 SENSITIVE Sensitive     CEFTRIAXONE  <=0.25 SENSITIVE Sensitive     CIPROFLOXACIN <=0.25 SENSITIVE Sensitive     GENTAMICIN <=1 SENSITIVE Sensitive     IMIPENEM <=0.25 SENSITIVE Sensitive  NITROFURANTOIN <=16 SENSITIVE Sensitive     TRIMETH/SULFA <=20 SENSITIVE Sensitive     AMPICILLIN/SULBACTAM <=2 SENSITIVE Sensitive     PIP/TAZO <=4 SENSITIVE Sensitive     * >=100,000 COLONIES/mL KLEBSIELLA PNEUMONIAE  Surgical pcr screen     Status: None   Collection Time: 02/12/22 12:23 AM   Specimen: Nasal Mucosa; Nasal Swab  Result Value Ref Range Status   MRSA, PCR NEGATIVE NEGATIVE Final   Staphylococcus aureus NEGATIVE NEGATIVE Final    Comment: (NOTE) The Xpert SA Assay (FDA approved for NASAL specimens in patients 79 years of age and older), is one component of a comprehensive surveillance program. It is not intended to diagnose infection nor to guide or monitor treatment. Performed at [facility], [place] [address], [place], [place] [address]   Aerobic/Anaerobic Culture w Gram Stain (surgical/deep wound)     Status: None (Preliminary result)   Collection Time: 02/12/22  1:51 PM   Specimen: Soft Tissue, Other  Result Value Ref Range Status   Specimen Description TISSUE HIP LEFT  Final   Special Requests A  Final   Gram Stain   Final    RARE WBC PRESENT, PREDOMINANTLY MONONUCLEAR NO ORGANISMS SEEN CRITICAL RESULT CALLED TO, READ BACK BY AND VERIFIED WITH: RN Julieta Gutting [id number] AT 0947 BY CM    Culture   Final    NO GROWTH 2 DAYS NO ANAEROBES ISOLATED; CULTURE IN PROGRESS FOR 5 DAYS Performed at [facility], [place] [address], [place], [place] [address]    Report Status PENDING  Incomplete  Aerobic/Anaerobic Culture w Gram Stain (surgical/deep wound)     Status: None (Preliminary result)   Collection Time: 02/12/22  2:08 PM   Specimen: Soft Tissue, Other  Result Value Ref Range Status   Specimen Description TISSUE LEFT FEMUR CANAL  Final   Special Requests B  Final   Gram Stain   Final    RARE WBC  PRESENT,BOTH PMN AND MONONUCLEAR NO ORGANISMS SEEN Gram Stain Report Called to,Read Back By and Verified With: DR.MARSHONIE [id number] '@1510'$  FH    Culture   Final    NO GROWTH 2 DAYS NO ANAEROBES ISOLATED; CULTURE IN PROGRESS FOR 5 DAYS Performed at [facility], [place] [address], [place], [place] [address]    Report Status PENDING  Incomplete  Aerobic/Anaerobic Culture w Gram Stain (surgical/deep wound)     Status: None (Preliminary result)   Collection Time: 02/12/22  2:39 PM   Specimen: Soft Tissue, Other  Result Value Ref Range Status   Specimen Description TISSUE LEFT HIP  Final   Special Requests SAMPLE NO 1 PT ON VANC, UNASYN  Final   Gram Stain NO WBC SEEN NO ORGANISMS SEEN   Final   Culture   Final    NO GROWTH 2 DAYS NO ANAEROBES ISOLATED; CULTURE IN PROGRESS FOR 5 DAYS Performed at [facility], [place] [address] [place], [place] [address]    Report Status PENDING  Incomplete  Aerobic/Anaerobic Culture w Gram Stain (surgical/deep wound)     Status: None (Preliminary result)   Collection Time: 02/12/22  2:44 PM   Specimen: Soft Tissue, Other  Result Value Ref Range Status   Specimen Description TISSUE LEFT HIP  Final   Special Requests SAMPLE NO 2 PT ON VANC, UNASYN  Final   Gram Stain NO WBC SEEN NO ORGANISMS SEEN   Final   Culture   Final    NO GROWTH 2 DAYS NO  ANAEROBES ISOLATED; CULTURE IN PROGRESS FOR 5 DAYS Performed at [facility], [place] [address], [place], [place] [address]    Report Status PENDING  Incomplete  Aerobic/Anaerobic Culture w Gram Stain (surgical/deep wound)     Status: None (Preliminary result)   Collection Time: 02/12/22  2:44 PM   Specimen: Soft Tissue, Other  Result Value Ref Range Status   Specimen Description TISSUE LEFT HIP  Final   Special Requests SAMPLE NO 3 PT ON VANC , UNASYN  Final   Gram Stain NO WBC SEEN NO ORGANISMS SEEN   Final   Culture   Final    NO GROWTH 2 DAYS NO ANAEROBES ISOLATED; CULTURE IN PROGRESS FOR 5  DAYS Performed at [facility], [place] [address] [place], [place] [address]    Report Status PENDING  Incomplete     Radiology Studies: DG HIP UNILAT W OR W/O PELVIS 2-3 VIEWS LEFT  Result Date: 02/12/2022 CLINICAL DATA:  Status post nail removal with conversion to total left hip arthroplasty. Postoperative. EXAM: DG HIP (WITH OR WITHOUT PELVIS) 2-3V LEFT COMPARISON:  Left humeral radiographs 02/05/2022 FINDINGS: Interval removal of the prior left femoral long intramedullary nail and femoral neck screw. New total left hip arthroplasty hardware with moderately long femoral stem. Proximal femoral greater trochanter and diaphyseal upper plate with 2 associated cerclage wires around the femoral stem prosthesis. Near anatomic alignment with unchanged medial displacement of the lesser trochanter. Moderate right femoroacetabular joint space narrowing. IMPRESSION: Interval removal of the prior left femoral long intramedullary nail and femoral neck screw. New total left hip arthroplasty hardware with moderately long femoral stem, cerclage wires, and proximal lateral femoral hook plate. Electronically Signed   By: Yvonne Kendall M.D.   On: 02/12/2022 18:15   DG HIP UNILAT WITH PELVIS 1V LEFT  Result Date: 02/12/2022 CLINICAL DATA:  Removal of femoral nail and conversion to total left hip arthroplasty. EXAM: DG HIP (WITH OR WITHOUT PELVIS) 1V*L* COMPARISON:  Left femur radiographs 02/06/2022, CT left femur 09/10/2021 FINDINGS: Images were performed intraoperatively without the presence of a radiologist. The patient is undergoing removal of the prior long femoral intramedullary nail. Placement of new left hip arthroplasty hardware with moderately long femoral stem. Two proximal femoral stem and cerclage wires with associated lateral greater trochanter proximal femoral plate. No hardware complication is seen. Total fluoroscopy images: 12 Total fluoroscopy time: 34 seconds Total dose: Radiation Exposure Index (as  provided by the fluoroscopic device): 7.37 mGy air Kerma Please see intraoperative findings for further detail. IMPRESSION: Intraoperative fluoroscopic guidance for removal of prior left femoral nail and placement of left hip arthroplasty. Electronically Signed   By: Yvonne Kendall M.D.   On: 02/12/2022 16:32   DG C-Arm 1-60 Min-No Report  Result Date: 02/12/2022 Fluoroscopy was utilized by the requesting physician.  No radiographic interpretation.   DG C-Arm 1-60 Min-No Report  Result Date: 02/12/2022 Fluoroscopy was utilized by the requesting physician.  No radiographic interpretation.   DG C-Arm 1-60 Min-No Report  Result Date: 02/12/2022 Fluoroscopy was utilized by the requesting physician.  No radiographic interpretation.     Scheduled Meds:  allopurinol  100 mg Oral Daily   cycloSPORINE  1 drop Both Eyes Daily   enoxaparin (LOVENOX) injection  30 mg Subcutaneous Q24H   ferrous sulfate  325 mg Oral Q breakfast   folic acid  1 mg Oral Daily   isosorbide mononitrate  90 mg Oral Daily   linaclotide  290 mcg Oral  q morning   mometasone-formoterol  2 puff Inhalation BID   pantoprazole  40 mg Oral BID   polyethylene glycol  17 g Oral BID   rosuvastatin  20 mg Oral QPM   senna-docusate  1 tablet Oral BID   sucralfate  1 g Oral TID WC & HS   triamcinolone cream   Topical BID   Continuous Infusions:  ampicillin-sulbactam (UNASYN) IV 200 mL/hr at 02/14/22 1158     LOS: 8 days    Flora Lipps, MD [facility] If 7PM-7AM, please contact night-coverage [email or web address]  02/14/2022, 12:12 PM

## 2022-02-14 NOTE — Evaluation (Signed)
Occupational Therapy Evaluation Patient Details Name: [name] MRN: [id number] DOB: 1945-11-21 Today's Date: 02/14/2022   History of Present Illness Pt is a 77 y/o F admitted on 02/06/22. Pt with recent hx of L hip & L superior pubic ramus fx & had f/u scan showing atrophic non union & pt started developing R hip pain & inability to walk. Pt was admitted & imaging showed cephalomedullary screw migration of the intertrochanteric nailing s/p fall 2 days prior to admission. Pt underwent removal of left cephalomedullary femoral nail, conversion of left total hip arthroplasty, ORIF of greater trochanter fracture with trochanteric hook plate on 10/17/2834. PMH: CAD s/p CABG, HFpEF, obesity, adrenal insufficiency, DM, HTN, CKD3, OSA/obesity-hypoventilation syndrome on 3L O2 chronically, complete heart block s/p PPM/ICD   Clinical Impression   Pt presents to OT with high levels of of post op pain, balance deficits, and generalized functional activity tolerance. PTA pt required assist for Adls from an aid daily. Pt completed ADLs today at a max A level for LB ADLs and (S) for UB. She required (S) for UB ADLs and max A for LB. Min+2 A to stand, mod A +2 to pivot. Pt would benefit from continued acute OT services to facilitate safe d/c home and optimize occupational performance. Recommend SNF at d/c.       Recommendations for follow up therapy are one component of a multi-disciplinary discharge planning process, led by the attending physician.  Recommendations may be updated based on patient status, additional functional criteria and insurance authorization.   Follow Up Recommendations  Skilled nursing-short term rehab (<3 hours/day)     Assistance Recommended at Discharge Frequent or constant Supervision/Assistance  Patient can return home with the following Two people to help with walking and/or transfers;Two people to help with bathing/dressing/bathroom    Functional Status Assessment  Patient  has had a recent decline in their functional status and demonstrates the ability to make significant improvements in function in a reasonable and predictable amount of time.  Equipment Recommendations  None recommended by OT    Recommendations for Other Services       Precautions / Restrictions Precautions Precautions: Fall;Posterior Hip Precaution Comments: LLE no active hip abduction Restrictions Weight Bearing Restrictions: Yes LLE Weight Bearing: Partial weight bearing LLE Partial Weight Bearing Percentage or Pounds: 50%      Mobility Bed Mobility Overal bed mobility: Needs Assistance Bed Mobility: Supine to Sit     Supine to sit: Total assist, +2 for physical assistance, +2 for safety/equipment     General bed mobility comments: Pt unable to assist, requires total assist +2 for supine>sitting EOB. Pt also limited by LLE pain with movement.    Transfers Overall transfer level: Needs assistance Equipment used: Rolling walker (2 wheels) Transfers: Sit to/from Stand, Bed to chair/wheelchair/BSC Sit to Stand: Min assist, +2 physical assistance Stand pivot transfers: Mod assist, +2 physical assistance, +2 safety/equipment                Balance Overall balance assessment: Needs assistance Sitting-balance support: Feet supported, Bilateral upper extremity supported Sitting balance-Leahy Scale: Fair Sitting balance - Comments: supervision static sitting   Standing balance support: Bilateral upper extremity supported, During functional activity, Reliant on assistive device for balance Standing balance-Leahy Scale: Poor                             ADL either performed or assessed with clinical judgement   ADL Overall  ADL's : Needs assistance/impaired Eating/Feeding: Supervision/ safety   Grooming: Wash/dry hands;Brushing hair;Set up   Upper Body Bathing: Supervision/ safety   Lower Body Bathing: Supervison/ safety   Upper Body Dressing :  Supervision/safety;Sitting   Lower Body Dressing: Maximal assistance;+2 for physical assistance;Cueing for safety;Adhering to hip precautions   Toilet Transfer: Moderate assistance;+2 for safety/equipment;Rolling walker (2 wheels)   Toileting- Clothing Manipulation and Hygiene: +2 for physical assistance;Maximal assistance       Functional mobility during ADLs: Moderate assistance;+2 for physical assistance General ADL Comments: Min A +2 to power up from the EOB, mod+2 for stand pivot to the recliner     Vision Baseline Vision/History: 0 No visual deficits Ability to See in Adequate Light: 0 Adequate Patient Visual Report: No change from baseline Vision Assessment?: No apparent visual deficits     Perception     Praxis      Pertinent Vitals/Pain Pain Assessment Pain Assessment: Faces Faces Pain Scale: Hurts whole lot Pain Location: L hip Pain Descriptors / Indicators: Discomfort, Grimacing, Guarding, Aching Pain Intervention(s): Repositioned, Monitored during session     Hand Dominance Right   Extremity/Trunk Assessment Upper Extremity Assessment Upper Extremity Assessment: Generalized weakness   Lower Extremity Assessment Lower Extremity Assessment: Generalized weakness RLE Deficits / Details: 3+/5 knee extension in sitting LLE Deficits / Details: 2/5 knee extension in sitting   Cervical / Trunk Assessment Cervical / Trunk Assessment: Kyphotic   Communication Communication Communication: No difficulties   Cognition Arousal/Alertness: Awake/alert Behavior During Therapy: WFL for tasks assessed/performed Overall Cognitive Status: Within Functional Limits for tasks assessed                                       General Comments  Pt on supplemental O2 via [place] throughout session    Exercises     Shoulder Instructions      Home Living Family/patient expects to be discharged to:: Private residence Living Arrangements: Children Available Help  at Discharge: Family;Available PRN/intermittently;Personal care attendant Type of Home: Mobile home Home Access: Stairs to enter;Ramped entrance Entrance Stairs-Number of Steps: 4 Entrance Stairs-Rails: Left Home Layout: One level     Bathroom Shower/Tub: Tub/shower unit     Bathroom Accessibility: Yes   Home Equipment: Tub bench;Rolling Walker (2 wheels);Hospital bed;Wheelchair - manual;Adaptive equipment;Rollator (4 wheels);BSC/3in1   Additional Comments: Wears 2L O2 baseline.Recently increased to 3L in hospital.  Son works during day, not available to assist. her grandson is available.      Prior Functioning/Environment Prior Level of Function : Needs assist;History of Falls (last six months)       Physical Assist : ADLs (physical)   ADLs (physical): Bathing;Dressing;Toileting;IADLs Mobility Comments: Pt receiving HHPT & ambulating with RW. ADLs Comments: Has personal care aide that comes to assist her with bathing daily.        OT Problem List: Decreased strength;Impaired balance (sitting and/or standing);Decreased safety awareness;Decreased activity tolerance;Decreased coordination;Decreased knowledge of use of DME or AE;Decreased range of motion      OT Treatment/Interventions: Self-care/ADL training;Balance training;Therapeutic activities;Therapeutic exercise;Energy conservation;Patient/family education    OT Goals(Current goals can be found in the care plan section) Acute Rehab OT Goals Patient Stated Goal: none stated OT Goal Formulation: With patient Time For Goal Achievement: 02/28/22 Potential to Achieve Goals: Good  OT Frequency: Min 2X/week    Co-evaluation PT/OT/SLP Co-Evaluation/Treatment: Yes Reason for Co-Treatment: Complexity of the patient's impairments (multi-system involvement);To  address functional/ADL transfers;For patient/therapist safety PT goals addressed during session: Mobility/safety with mobility;Strengthening/ROM OT goals addressed  during session: ADL's and self-care;Proper use of Adaptive equipment and DME      AM-PAC OT "6 Clicks" Daily Activity     Outcome Measure Help from another person eating meals?: None Help from another person taking care of personal grooming?: None Help from another person toileting, which includes using toliet, bedpan, or urinal?: A Lot Help from another person bathing (including washing, rinsing, drying)?: A Little Help from another person to put on and taking off regular upper body clothing?: A Little Help from another person to put on and taking off regular lower body clothing?: A Lot 6 Click Score: 18   End of Session Equipment Utilized During Treatment: Gait belt;Rolling walker (2 wheels) Nurse Communication: Patient requests pain meds  Activity Tolerance: Patient tolerated treatment well Patient left: in chair;with call bell/phone within reach;with family/visitor present  OT Visit Diagnosis: Unsteadiness on feet (R26.81);Muscle weakness (generalized) (M62.81)                Time: 8185-6314 OT Time Calculation (min): 26 min Charges:  OT General Charges $OT Visit: 1 Visit OT Evaluation $OT Eval Moderate Complexity: 1 Mod  Laverle Hobby, OTR/L, CBIS Acute Rehab Office: [phone]   Curtis Sites 02/14/2022, 3:44 PM

## 2022-02-14 NOTE — Progress Notes (Signed)
     Subjective: Patient reports difficulty with pain overnight. BP has been low so has not been receiving opiods but does have tylenol and robaxin available. Received blood yesterday. CBC today pending. Plan for PT hopefully today. Denies new n/t. Intra-op cxs x5 with NGTD. Remains on Unasyn.  Objective:   VITALS:   Vitals:   02/14/22 0235 02/14/22 0350 02/14/22 0835 02/14/22 0858  BP: (!) 95/37 (!) 107/46 (!) 109/45   Pulse:  80 86 84  Resp:  '16 18 16  '$ Temp:  98 F (36.7 C) 98.3 F (36.8 C)   TempSrc:  Oral    SpO2:   100% 100%  Weight:      Height:        Sensation intact distally Intact pulses distally Dorsiflexion/Plantar flexion intact Incision: dressing C/D/I Compartment soft   Lab Results  Component Value Date   WBC 8.1 02/13/2022   HGB 6.7 (LL) 02/13/2022   HCT 22.2 (L) 02/13/2022   MCV 96.1 02/13/2022   PLT 134 (L) 02/13/2022   BMET    Component Value Date/Time   NA 137 02/14/2022 0458   NA 136 09/02/2021 1221   K 3.7 02/14/2022 0458   CL 99 02/14/2022 0458   CO2 27 02/14/2022 0458   GLUCOSE 129 (H) 02/14/2022 0458   BUN 71 (H) 02/14/2022 0458   BUN 85 (HH) 09/02/2021 1221   CREATININE 2.98 (H) 02/14/2022 0458   CREATININE 1.05 04/04/2014 1014   CALCIUM 8.4 (L) 02/14/2022 0458   CALCIUM 9.3 04/25/2021 0626   EGFR 11 (L) 09/02/2021 1221   GFRNONAA 16 (L) 02/14/2022 0458      Xray: THA revision components in good position, no adverse features.  Assessment/Plan: 2 Days Post-Op   Principal Problem:   Pain from implanted hardware Active Problems:   HTN (hypertension), benign   Gastritis without bleeding   Cardiomyopathy (HCC)   OSA (obstructive sleep apnea)   Mixed hypercholesterolemia and hypertriglyceridemia   CAD S/P percutaneous coronary angioplasty   Chronic diastolic CHF (congestive heart failure) (HCC)   CHB (complete heart block) (HCC)   Atrial fibrillation (HCC)   ESRD (end stage renal disease) ([place])   Diabetes mellitus  without complication (HCC)   Thrombocytopenia (HCC)   Irritable bowel syndrome with constipation   Congestive heart failure (CHF) (HCC)   Supplemental oxygen dependent   Hypertension   CHF (congestive heart failure) (HCC)   Diabetic renal disease ([place])   Pure hypercholesterolemia   Physical deconditioning   Chronic hypoxemic respiratory failure (HCC)   Pressure injury of back, stage 2 (HCC)  S/p L hip hardware removal conversion to THA and ORIF greater trochanter fracture 02/12/22   Post op recs: WB: 50% WBAT LLE, posterior hip precautions x 6 weeks, no active abduction. Abx: Vancomycin + Unasyn continue abx pending intra-op cxs Imaging: PACU pelvis Xray Dressing: Aquacell, keep intact until follow up DVT prophylaxis: Lovenox daily starting postop day 1 x 4 weeks Follow up: 2 weeks after surgery for a wound check with Dr. Zachery Dakins at [facility].  Address: [address] [place], [place], [place] [address]  Office Phone: [phone]    [name] 02/14/2022, 9:00 AM   Charlies Constable, MD  Contact information:   [phone] 7am-5pm epic message Dr. Zachery Dakins, or call office for patient follow up: [phone] After hours and holidays please check [email or web address] for group call information for [facility]

## 2022-02-14 NOTE — Care Management Important Message (Signed)
Important Message  Patient Details  Name: [name] MRN: [id number] Date of Birth: Jan 03, 1946   Medicare Important Message Given:  Yes     Hannah Beat 02/14/2022, 1:00 PM

## 2022-02-14 NOTE — Plan of Care (Signed)

## 2022-02-14 NOTE — Evaluation (Addendum)
Physical Therapy Evaluation Patient Details Name: [name] MRN: [id number] DOB: [birth year] Today's Date: 02/14/2022  History of Present Illness  Pt is a 77 y/o F admitted on 02/06/22. Pt with recent hx of L hip & L superior pubic ramus fx & had f/u scan showing atrophic non union & pt started developing R hip pain & inability to walk. Pt was admitted & imaging showed cephalomedullary screw migration of the intertrochanteric nailing s/p fall 2 days prior to admission. Pt underwent removal of left cephalomedullary femoral nail, conversion of left total hip arthroplasty, ORIF of greater trochanter fracture with trochanteric hook plate on 08/16/7207. PMH: CAD s/p CABG, HFpEF, obesity, adrenal insufficiency, DM, HTN, CKD3, OSA/obesity-hypoventilation syndrome on 3L O2 chronically, complete heart block s/p PPM/ICD  Clinical Impression  MD cleared pt for participation in PT in setting of low Hgb. Pt seen for PT evaluation with co-tx with OT for pt & therapists' safety. On this date, pt endorses significant L hip pain but is motivated & willing to participate with encouragement. Pt unable to move LLE to initiate supine>sit 2/2 pain & weakness therefore requiring total assist +2 to come to sitting EOB. However, pt is able to transfer STS with min assist +2 & cuing for safe hand placement, mod assist +2 for stand pivot bed>recliner. Pt demonstrates decreased ability to advance LLE to step, and decreased ability to weight bear/shift to LLE during transfer. Pt would benefit from STR upon d/c to maximize independence with functional mobility & reduce fall risk prior to return home.  Pt did c/o dizziness upon sitting EOB but BP in LUE 100/63 mmHg MAP 75.    Recommendations for follow up therapy are one component of a multi-disciplinary discharge planning process, led by the attending physician.  Recommendations may be updated based on patient status, additional functional criteria and insurance  authorization.  Follow Up Recommendations Skilled nursing-short term rehab (<3 hours/day) Can patient physically be transported by private vehicle: No    Assistance Recommended at Discharge Frequent or constant Supervision/Assistance  Patient can return home with the following  Two people to help with walking and/or transfers;Two people to help with bathing/dressing/bathroom;Help with stairs or ramp for entrance;Assist for transportation;Assistance with cooking/housework    Equipment Recommendations None recommended by PT  Recommendations for Other Services       Functional Status Assessment Patient has had a recent decline in their functional status and demonstrates the ability to make significant improvements in function in a reasonable and predictable amount of time.     Precautions / Restrictions Precautions Precautions: Fall;Posterior Hip Precaution Comments: LLE no active hip abduction Restrictions Weight Bearing Restrictions: Yes LLE Weight Bearing: Partial weight bearing LLE Partial Weight Bearing Percentage or Pounds: 50%      Mobility  Bed Mobility Overal bed mobility: Needs Assistance Bed Mobility: Supine to Sit     Supine to sit: Total assist, +2 for physical assistance, +2 for safety/equipment     General bed mobility comments: Pt unable to assist, requires total assist +2 for supine>sitting EOB. Pt also limited by LLE pain with movement.    Transfers Overall transfer level: Needs assistance Equipment used: Rolling walker (2 wheels) Transfers: Sit to/from Stand, Bed to chair/wheelchair/BSC Sit to Stand: Min assist, +2 physical assistance Stand pivot transfers: Mod assist, +2 physical assistance, +2 safety/equipment              Ambulation/Gait  Stairs            Wheelchair Mobility    Modified Rankin (Stroke Patients Only)       Balance Overall balance assessment: Needs assistance Sitting-balance support: Feet  supported, Bilateral upper extremity supported Sitting balance-Leahy Scale: Fair Sitting balance - Comments: supervision static sitting   Standing balance support: Bilateral upper extremity supported, During functional activity, Reliant on assistive device for balance Standing balance-Leahy Scale: Poor                               Pertinent Vitals/Pain Pain Assessment Pain Assessment: Faces Faces Pain Scale: Hurts whole lot Pain Location: L hip Pain Descriptors / Indicators: Discomfort, Grimacing, Guarding, Aching Pain Intervention(s): Monitored during session, Repositioned, Patient requesting pain meds-RN notified    Home Living Family/patient expects to be discharged to:: Private residence Living Arrangements: Children Available Help at Discharge: Family;Available PRN/intermittently;Personal care attendant Type of Home: Mobile home Home Access: Stairs to enter;Ramped entrance       Home Layout: One level Home Equipment: Tub bench;Rolling Walker (2 wheels);Hospital bed;Wheelchair - Radio broadcast assistant (4 wheels);BSC/3in1      Prior Function Prior Level of Function : Needs assist;History of Falls (last six months)             Mobility Comments: Pt receiving HHPT & ambulating with RW. ADLs Comments: Has personal care aide that comes to assist her with bathing daily.     Hand Dominance        Extremity/Trunk Assessment   Upper Extremity Assessment Upper Extremity Assessment: Generalized weakness    Lower Extremity Assessment Lower Extremity Assessment: Generalized weakness;LLE deficits/detail RLE Deficits / Details: 3+/5 knee extension in sitting LLE Deficits / Details: 2/5 knee extension in sitting       Communication   Communication: No difficulties  Cognition Arousal/Alertness: Awake/alert Behavior During Therapy: WFL for tasks assessed/performed Overall Cognitive Status: Within Functional Limits for tasks assessed                                           General Comments General comments (skin integrity, edema, etc.): Pt on supplemental O2 via nasal cannula throughout session.    Exercises     Assessment/Plan    PT Assessment Patient needs continued PT services  PT Problem List Decreased strength;Decreased coordination;Cardiopulmonary status limiting activity;Pain;Decreased range of motion;Decreased knowledge of use of DME;Decreased activity tolerance;Decreased balance;Decreased safety awareness;Decreased mobility;Decreased knowledge of precautions;Decreased skin integrity       PT Treatment Interventions DME instruction;Therapeutic exercise;Gait training;Balance training;Stair training;Neuromuscular re-education;Functional mobility training;Patient/family education;Therapeutic activities;Modalities    PT Goals (Current goals can be found in the Care Plan section)  Acute Rehab PT Goals Patient Stated Goal: decreased pain PT Goal Formulation: With patient Time For Goal Achievement: 02/28/22 Potential to Achieve Goals: Good    Frequency Min 3X/week     Co-evaluation PT/OT/SLP Co-Evaluation/Treatment: Yes Reason for Co-Treatment: Complexity of the patient's impairments (multi-system involvement);Necessary to address cognition/behavior during functional activity;For patient/therapist safety;To address functional/ADL transfers PT goals addressed during session: Mobility/safety with mobility;Strengthening/ROM         AM-PAC PT "6 Clicks" Mobility  Outcome Measure Help needed turning from your back to your side while in a flat bed without using bedrails?: Total Help needed moving from lying on your back to sitting on the side of a  flat bed without using bedrails?: Total Help needed moving to and from a bed to a chair (including a wheelchair)?: A Lot Help needed standing up from a chair using your arms (e.g., wheelchair or bedside chair)?: A Lot Help needed to walk in hospital room?:  Total Help needed climbing 3-5 steps with a railing? : Total 6 Click Score: 8    End of Session Equipment Utilized During Treatment: Gait belt;Oxygen Activity Tolerance: Patient tolerated treatment well;Patient limited by pain Patient left: in chair;with call bell/phone within reach;with family/visitor present Nurse Communication: Mobility status;Weight bearing status PT Visit Diagnosis: Pain;Difficulty in walking, not elsewhere classified (R26.2);Other abnormalities of gait and mobility (R26.89);Muscle weakness (generalized) (M62.81) Pain - Right/Left: Left Pain - part of body: Hip    Time: 0881-1031 PT Time Calculation (min) (ACUTE ONLY): 26 min   Charges:   PT Evaluation $PT Eval Moderate Complexity: Secretary, PT, DPT 02/14/22, 3:29 PM   Waunita Schooner 02/14/2022, 3:29 PM

## 2022-02-14 NOTE — Progress Notes (Signed)
Admit: 02/06/2022 LOS: 8  63F with advanced CKD and s/p L hip fracture nonunion s/p surgical repair 02/12/22  Subjective:  No interval events, has quite a bit of pain To receive 1 unit PRBC for hemoglobin 6.9 Creatinine currently stable at 2.98, BUN 71, K3.7 ? 0.5L UOP yesterday  01/04 0701 - 01/05 0700 In: 974 [P.O.:480; Blood:494] Out: 500 [Urine:500]  Filed Weights   02/06/22 1435 02/06/22 2332 02/12/22 1059  Weight: 85.7 kg 89.9 kg 85.7 kg    Scheduled Meds:  allopurinol  100 mg Oral Daily   cycloSPORINE  1 drop Both Eyes Daily   enoxaparin (LOVENOX) injection  30 mg Subcutaneous Q24H   ferrous sulfate  325 mg Oral Q breakfast   folic acid  1 mg Oral Daily   furosemide  80 mg Oral BID   isosorbide mononitrate  90 mg Oral Daily   linaclotide  290 mcg Oral q morning   mometasone-formoterol  2 puff Inhalation BID   pantoprazole  40 mg Oral BID   polyethylene glycol  17 g Oral BID   rosuvastatin  20 mg Oral QPM   senna-docusate  1 tablet Oral BID   sucralfate  1 g Oral TID WC & HS   triamcinolone cream   Topical BID   Continuous Infusions:  ampicillin-sulbactam (UNASYN) IV 3 g (02/14/22 0045)   PRN Meds:.acetaminophen **OR** acetaminophen, albuterol, calcium carbonate, fluticasone, HYDROmorphone (DILAUDID) injection, hydrOXYzine, methocarbamol, ondansetron **OR** ondansetron (ZOFRAN) IV, oxyCODONE, simethicone  Current Labs: reviewed   Physical Exam:  Blood pressure (!) 109/44, pulse 86, temperature 98.8 F (37.1 C), temperature source Oral, resp. rate 18, height '5\' 3"'$  (1.6 m), weight 85.7 kg, SpO2 94 %. NAD RRR CTAB Obese S/nt/nd  A CKD4 BL SCr 2.5 to 3.2, w/in baseline S/p surgery for nonunion of L femoral neck fracture 02/12/22 Chronic HFpEF Chronic O2 dep't resp failure Klebsiella UTI on unasyn S/p PPM for CHF  P Will need to trend SCr and UOP for next 24-48h to see if any kidney injury during surgery, UOP lessened and creatinine up a smidge so I am a  little bit worried that we might see further worsening Hold Lasix for now  medication Issues; Preferred narcotic agents for pain control are hydromorphone, fentanyl, and methadone. Morphine should not be used.  Baclofen should be avoided Avoid oral sodium phosphate and magnesium citrate based laxatives / bowel preps    Pearson Grippe MD 02/14/2022, 11:37 AM  Recent Labs  Lab 02/12/22 0313 02/12/22 1540 02/13/22 0351 02/13/22 0352 02/14/22 0458  NA 137   < > 137 138 137  K 3.7   < > 4.2 4.2 3.7  CL 97*   < > 100 99 99  CO2 31  --  '25 27 27  '$ GLUCOSE 120*   < > 164* 163* 129*  BUN 60*   < > 62* 59* 71*  CREATININE 2.48*   < > 2.71* 2.81* 2.98*  CALCIUM 9.4  --  8.7* 8.8* 8.4*  PHOS 3.8  --   --  5.4* 4.4   < > = values in this interval not displayed.    Recent Labs  Lab 02/12/22 0313 02/12/22 1540 02/12/22 1729 02/13/22 0351 02/14/22 0456  WBC 7.9  --   --  8.1 9.7  HGB 8.7*   < > 9.5* 6.7* 6.9*  HCT 29.7*   < > 28.0* 22.2* 21.1*  MCV 95.2  --   --  96.1 91.3  PLT 172  --   --  134* 136*   < > = values in this interval not displayed.

## 2022-02-15 DIAGNOSIS — T85848A Pain due to other internal prosthetic devices, implants and grafts, initial encounter: Secondary | ICD-10-CM | POA: Diagnosis not present

## 2022-02-15 LAB — RENAL FUNCTION PANEL
Albumin: 2.2 g/dL — ABNORMAL LOW (ref 3.5–5.0)
Anion gap: 11 (ref 5–15)
BUN: 69 mg/dL — ABNORMAL HIGH (ref 8–23)
CO2: 25 mmol/L (ref 22–32)
Calcium: 8.5 mg/dL — ABNORMAL LOW (ref 8.9–10.3)
Chloride: 100 mmol/L (ref 98–111)
Creatinine, Ser: 3.06 mg/dL — ABNORMAL HIGH (ref 0.44–1.00)
GFR, Estimated: 15 mL/min — ABNORMAL LOW (ref >60)
Glucose, Bld: 120 mg/dL — ABNORMAL HIGH (ref 70–99)
Phosphorus: 3.5 mg/dL (ref 2.5–4.6)
Potassium: 3.4 mmol/L — ABNORMAL LOW (ref 3.5–5.1)
Sodium: 136 mmol/L (ref 135–145)

## 2022-02-15 LAB — BPAM RBC
Blood Product Expiration Date: 202401112359
Blood Product Expiration Date: 202401212359
Blood Product Expiration Date: 202401262359
Blood Product Expiration Date: 202401262359
ISSUE DATE / TIME: 202401031314
ISSUE DATE / TIME: 202401040227
ISSUE DATE / TIME: 202401041340
ISSUE DATE / TIME: 202401051040
Unit Type and Rh: 5100
Unit Type and Rh: 5100
Unit Type and Rh: 5100
Unit Type and Rh: 9500

## 2022-02-15 LAB — TYPE AND SCREEN
ABO/RH(D): O POS
Antibody Screen: NEGATIVE
Unit division: 0
Unit division: 0
Unit division: 0
Unit division: 0

## 2022-02-15 LAB — CBC
HCT: 23.7 % — ABNORMAL LOW (ref 36.0–46.0)
Hemoglobin: 8.1 g/dL — ABNORMAL LOW (ref 12.0–15.0)
MCH: 30.9 pg (ref 26.0–34.0)
MCHC: 34.2 g/dL (ref 30.0–36.0)
MCV: 90.5 fL (ref 80.0–100.0)
Platelets: 148 10*3/uL — ABNORMAL LOW (ref 150–400)
RBC: 2.62 MIL/uL — ABNORMAL LOW (ref 3.87–5.11)
RDW: 15.4 % (ref 11.5–15.5)
WBC: 9.2 10*3/uL (ref 4.0–10.5)
nRBC: 0 % (ref 0.0–0.2)

## 2022-02-15 LAB — GLUCOSE, CAPILLARY
Glucose-Capillary: 143 mg/dL — ABNORMAL HIGH (ref 70–99)
Glucose-Capillary: 143 mg/dL — ABNORMAL HIGH (ref 70–99)
Glucose-Capillary: 137 mg/dL — ABNORMAL HIGH (ref 70–99)
Glucose-Capillary: 146 mg/dL — ABNORMAL HIGH (ref 70–99)

## 2022-02-15 LAB — MAGNESIUM: Magnesium: 1.8 mg/dL (ref 1.7–2.4)

## 2022-02-15 NOTE — Progress Notes (Signed)
Admit: 02/06/2022 LOS: 9  [name] with advanced CKD and s/p L hip fracture nonunion s/p surgical repair 02/12/22  Subjective:  No interval events, Creatinine essentially the same, BUN 69, K3.4 0.6 L urine output Feeling better  01/05 0701 - 01/06 0700 In: 2673 [P.O.:240; Blood:315; IV Piggyback:2118] Out: 600 [Urine:600]  Filed Weights   02/06/22 1435 02/06/22 2332 02/12/22 1059  Weight: 85.7 kg 89.9 kg 85.7 kg    Scheduled Meds:  allopurinol  100 mg Oral Daily   cycloSPORINE  1 drop Both Eyes Daily   enoxaparin (LOVENOX) injection  30 mg Subcutaneous Q24H   ferrous sulfate  325 mg Oral Q breakfast   folic acid  1 mg Oral Daily   isosorbide mononitrate  90 mg Oral Daily   linaclotide  290 mcg Oral q morning   mometasone-formoterol  2 puff Inhalation BID   pantoprazole  40 mg Oral BID   polyethylene glycol  17 g Oral BID   rosuvastatin  20 mg Oral QPM   senna-docusate  1 tablet Oral BID   sucralfate  1 g Oral TID WC & HS   triamcinolone cream   Topical BID   Continuous Infusions:  ampicillin-sulbactam (UNASYN) IV Stopped (02/15/22 0605)   PRN Meds:.acetaminophen **OR** acetaminophen, albuterol, calcium carbonate, fluticasone, HYDROmorphone (DILAUDID) injection, hydrOXYzine, methocarbamol, ondansetron **OR** ondansetron (ZOFRAN) IV, oxyCODONE, simethicone  Current Labs: reviewed   Physical Exam:  Blood pressure (!) 107/36, pulse 88, temperature 98.8 F (37.1 C), temperature source Oral, resp. rate 17, height '5\' 3"'$  (1.6 m), weight 85.7 kg, SpO2 98 %. NAD RRR CTAB Obese S/nt/nd  A CKD4 BL SCr 2.5 to 3.2, w/in baseline S/p surgery for nonunion of L femoral neck fracture 02/12/22 Chronic HFpEF Chronic O2 dep't resp failure Klebsiella UTI on unasyn S/p PPM for CHF  P Seems that she is holding stable GFR No further needs, can use Lasix as clinically indicated Has follow-up with Dr. Moshe Cipro on 02/25/2022 at our office Will sign off at this time medication  Issues; Preferred narcotic agents for pain control are hydromorphone, fentanyl, and methadone. Morphine should not be used.  Baclofen should be avoided Avoid oral sodium phosphate and magnesium citrate based laxatives / bowel preps    Pearson Grippe MD 02/15/2022, 10:35 AM  Recent Labs  Lab 02/13/22 0352 02/14/22 0458 02/15/22 0357  NA 138 137 136  K 4.2 3.7 3.4*  CL 99 99 100  CO2 '27 27 25  '$ GLUCOSE 163* 129* 120*  BUN 59* 71* 69*  CREATININE 2.81* 2.98* 3.06*  CALCIUM 8.8* 8.4* 8.5*  PHOS 5.4* 4.4 3.5    Recent Labs  Lab 02/13/22 0351 02/14/22 0456 02/15/22 0357  WBC 8.1 9.7 9.2  HGB 6.7* 6.9* 8.1*  HCT 22.2* 21.1* 23.7*  MCV 96.1 91.3 90.5  PLT 134* 136* 148*

## 2022-02-15 NOTE — Progress Notes (Signed)
PROGRESS NOTE    [name]  VXB:[id number] DOB: April 27, 1945 DOA: 02/06/2022 PCP: Lujean Amel, MD   Brief Narrative: [name] is a 77 years old female with past medical history of CAD status post CABG, HFpEF, Obesity, adrenal insufficiency, Diabetes mellitus, HTN, CKD stage IV,  OSA/obesity-hypoventilation syndrome on 3 L O2 chronically, complete heart block s/p PPM/ICD and recent history of left hip and left superior pubic ramus fracture who had a follow-up scan showed atrophic none union.  After Christmas patient started developing right hip pain and could not walk so was admitted hospital for  total hip arthroplasty.  Imaging showed cephalomedullary screw  migration of the intertrochanteric nailing status post fall 2 days prior to admission.  Patient was seen by orthopedics and underwent surgical intervention 02/12/2022.  Nephrology was also consulted for previous history of dialysis.  Postoperatively, patient required 2 units of packed RBC.  Physical therapy pending at this time.  02/15/2022: Patient seen.  No complaints.  Patient is awaiting short-term SNF.  Pursue disposition.  Assessment & Plan:   Principal Problem:   Pain from implanted hardware Active Problems:   HTN (hypertension), benign   Gastritis without bleeding   Cardiomyopathy (HCC)   OSA (obstructive sleep apnea)   Mixed hypercholesterolemia and hypertriglyceridemia   CAD S/P percutaneous coronary angioplasty   Chronic diastolic CHF (congestive heart failure) (HCC)   CHB (complete heart block) (HCC)   Atrial fibrillation (HCC)   ESRD (end stage renal disease) ([place])   Diabetes mellitus without complication (HCC)   Thrombocytopenia (HCC)   Irritable bowel syndrome with constipation   Congestive heart failure (CHF) (HCC)   Supplemental oxygen dependent   Hypertension   CHF (congestive heart failure) (HCC)   Diabetic renal disease ([place])   Pure hypercholesterolemia   Physical deconditioning   Chronic hypoxemic  respiratory failure (HCC)   Pressure injury of back, stage 2 (HCC)  Right hip pain/migration of hardware; left intertrochanteric hip fracture with nonunion and fixation failure. Orthopedics was consulted and patient underwent removal of left cephalomedullary femoral nail, conversion of left total hip arthroplasty, ORIF of greater trochanter fracture with trochanteric hook plate on 04/13/74.  Further management as per orthopedics.  Intraoperative cultures negative in 2 days. 02/15/2022: Pursue disposition.  Chronic hypoxemic respiratory failure secondary to COPD and asthma. Patient is on 3 L of oxygen at baseline. Continue bronchodilators. 624: Stable.  Acute postoperative anemia.  Patient received 1 unit of packed RBC yesterday.  Will transfuse 1 more unit of packed RBC today.  Hemoglobin today at 6.9. 02/15/2022: Hemoglobin today is 8.1 g/dL.  CKD 4  Baseline creatinine of 2.5-3.2.  After surgery in February, patient was on dialysis for 3 months..  Nephrology on board due to this.  Has been started on Lasix 80 twice daily.  Continue strict intake and output charting Daily weights..  Avoid nephrotoxic medication.  Follow nephrology recommendations.  Latest creatinine of 2.9. 02/15/2022: Stable CKD 4.  OSA (obstructive sleep apnea) Not on CPAP.     Physical deconditioning Physical therapy pending after surgery.   HTN (hypertension), benign Continue Imdur with holding parameters.  Latest blood pressure at 105/49, has been having episodes of hypotension.   Hypokalemia Replenished and improved.  Latest potassium of 3.7.   Gout On allopurinol   Chronic diastolic and systolic EF 22-63 % CHF (congestive heart failure) (HCC)/CAD -Stable, Continue with  Imdur, Crestor. Aspirin and Plavix on hold due to postoperative anemia requiring blood transfusion x2 units..  CHF is compensated at  this time.  Resume aspirin Plavix when okay with surgery.  CHB (complete heart block) Status post permanent  pacemaker placement.   GERD On sucralfate and Protonix.  Anemia of chronic kidney disease. On iron and folic acid as outpatient.  Hypercholesterolemia Continue Crestor.  Constipation; continue bowel regimen.  Klebsiella pneumonia UTI. On Unasyn IV until Sunday, 02/16/2022.   DVT prophylaxis: SCD  Code Status: Full code  Family Communication: Spoke with the patient's son at bedside.  Disposition Plan: At this time, will need PT evaluation.  Status is: Inpatient  Remains inpatient appropriate because: management of hip hardware migration.,  Status post surgical intervention.  PT evaluation pending.  Consultants:  Orthopedics Nephrology  Procedures:  removal of left cephalomedullary femoral nail, conversion of left total hip arthroplasty, ORIF of greater trochanter fracture with trochanteric hook plate on 07/12/9507 by Dr. Zachery Dakins Transfusion of packed RBC 2 units.  Antimicrobials:  Unasyn IV  Subjective: No new complaints.  Objective: Vitals:   02/14/22 1318 02/14/22 1400 02/15/22 0009 02/15/22 0841  BP: (!) 108/49 (!) 105/49 (!) 125/53 (!) 107/36  Pulse: 86 85 82 88  Resp: '18 17 17   '$ Temp: 99.5 F (37.5 C) 98.5 F (36.9 C) 99.4 F (37.4 C) 98.8 F (37.1 C)  TempSrc: Oral Oral Oral Oral  SpO2: 95%  99% 98%  Weight:      Height:        Intake/Output Summary (Last 24 hours) at 02/15/2022 1352 Last data filed at 02/15/2022 1300 Gross per 24 hour  Intake 2453 ml  Output 1300 ml  Net 1153 ml    Filed Weights   02/06/22 1435 02/06/22 2332 02/12/22 1059  Weight: 85.7 kg 89.9 kg 85.7 kg   Body mass index is 33.48 kg/m.   General: Patient is morbidly obese.  Not in any distress.  Awake and alert. HEENT: Patient is pale.  No jaundice. Neck: Supple.  JVD is difficult to assess. Lungs: Decreased air entry. CVS: S1-S2, irregularly irregular. Abdomen: Morbidly obese.  Soft and nontender. Neuro: Awake and alert. Extremities: Fullness of the ankle.  Data  Reviewed: I have personally reviewed following labs and imaging studies  CBC: Recent Labs  Lab 02/11/22 0732 02/12/22 0313 02/12/22 1540 02/12/22 1648 02/12/22 1729 02/13/22 0351 02/14/22 0456 02/15/22 0357  WBC 7.7 7.9  --   --   --  8.1 9.7 9.2  HGB 9.1* 8.7*   < > 10.5* 9.5* 6.7* 6.9* 8.1*  HCT 28.7* 29.7*   < > 31.0* 28.0* 22.2* 21.1* 23.7*  MCV 92.9 95.2  --   --   --  96.1 91.3 90.5  PLT 173 172  --   --   --  134* 136* 148*   < > = values in this interval not displayed.    Basic Metabolic Panel: Recent Labs  Lab 02/10/22 0257 02/11/22 0732 02/12/22 0313 02/12/22 1540 02/12/22 1729 02/13/22 0351 02/13/22 0352 02/14/22 0458 02/15/22 0357  NA 137   < > 137   < > 139 137 138 137 136  K 2.9*   < > 3.7   < > 3.8 4.2 4.2 3.7 3.4*  CL 95*   < > 97*   < > 101 100 99 99 100  CO2 29   < > 31  --   --  '25 27 27 25  '$ GLUCOSE 118*   < > 120*   < > 142* 164* 163* 129* 120*  BUN 69*   < > 60*   < >  52* 62* 59* 71* 69*  CREATININE 2.88*   < > 2.48*   < > 2.50* 2.71* 2.81* 2.98* 3.06*  CALCIUM 9.3   < > 9.4  --   --  8.7* 8.8* 8.4* 8.5*  MG 1.8  --   --   --   --  1.9  --  1.9 1.8  PHOS  --   --  3.8  --   --   --  5.4* 4.4 3.5   < > = values in this interval not displayed.    GFR: Estimated Creatinine Clearance: 16 mL/min (A) (by C-G formula based on SCr of 3.06 mg/dL (H)). Liver Function Tests: Recent Labs  Lab 02/12/22 0313 02/13/22 0352 02/14/22 0458 02/15/22 0357  ALBUMIN 2.3* 2.8* 2.4* 2.2*    No results for input(s): "LIPASE", "AMYLASE" in the last 168 hours. No results for input(s): "AMMONIA" in the last 168 hours. Coagulation Profile: No results for input(s): "INR", "PROTIME" in the last 168 hours. Cardiac Enzymes: No results for input(s): "CKTOTAL", "CKMB", "CKMBINDEX", "TROPONINI" in the last 168 hours. BNP (last 3 results) No results for input(s): "PROBNP" in the last 8760 hours. HbA1C: No results for input(s): "HGBA1C" in the last 72  hours. CBG: Recent Labs  Lab 02/14/22 1116 02/14/22 1559 02/14/22 2006 02/15/22 0837 02/15/22 1158  GLUCAP 145* 164* 139* 143* 143*    Lipid Profile: No results for input(s): "CHOL", "HDL", "LDLCALC", "TRIG", "CHOLHDL", "LDLDIRECT" in the last 72 hours. Thyroid Function Tests: No results for input(s): "TSH", "T4TOTAL", "FREET4", "T3FREE", "THYROIDAB" in the last 72 hours. Anemia Panel: No results for input(s): "VITAMINB12", "FOLATE", "FERRITIN", "TIBC", "IRON", "RETICCTPCT" in the last 72 hours.  Sepsis Labs: No results for input(s): "PROCALCITON", "LATICACIDVEN" in the last 168 hours.  Recent Results (from the past 240 hour(s))  Body fluid culture w Gram Stain     Status: None   Collection Time: 02/07/22  3:01 PM   Specimen: PATH Cytology Misc. fluid; Body Fluid  Result Value Ref Range Status   Specimen Description FLUID LEFT HIP  Final   Special Requests NONE  Final   Gram Stain   Final    FEW WBC PRESENT, PREDOMINANTLY MONONUCLEAR NO ORGANISMS SEEN    Culture   Final    NO GROWTH 3 DAYS Performed at [facility], [address], [place], [place] [address]    Report Status 02/10/2022 FINAL  Final  Urine Culture     Status: Abnormal   Collection Time: 02/09/22 11:49 AM   Specimen: Urine, Clean Catch  Result Value Ref Range Status   Specimen Description URINE, CLEAN CATCH  Final   Special Requests   Final    NONE Performed at [facility], [place] [address], [place], [place] [address]    Culture >=100,000 COLONIES/mL KLEBSIELLA PNEUMONIAE (A)  Final   Report Status 02/11/2022 FINAL  Final   Organism ID, Bacteria KLEBSIELLA PNEUMONIAE (A)  Final      Susceptibility   Klebsiella pneumoniae - MIC*    AMPICILLIN RESISTANT Resistant     CEFAZOLIN <=4 SENSITIVE Sensitive     CEFEPIME <=0.12 SENSITIVE Sensitive     CEFTRIAXONE <=0.25 SENSITIVE Sensitive     CIPROFLOXACIN <=0.25 SENSITIVE Sensitive     GENTAMICIN <=1 SENSITIVE Sensitive     IMIPENEM <=0.25  SENSITIVE Sensitive     NITROFURANTOIN <=16 SENSITIVE Sensitive     TRIMETH/SULFA <=20 SENSITIVE Sensitive     AMPICILLIN/SULBACTAM <=2 SENSITIVE Sensitive  PIP/TAZO <=4 SENSITIVE Sensitive     * >=100,000 COLONIES/mL KLEBSIELLA PNEUMONIAE  Surgical pcr screen     Status: None   Collection Time: 02/12/22 12:23 AM   Specimen: Nasal Mucosa; Nasal Swab  Result Value Ref Range Status   MRSA, PCR NEGATIVE NEGATIVE Final   Staphylococcus aureus NEGATIVE NEGATIVE Final    Comment: (NOTE) The Xpert SA Assay (FDA approved for NASAL specimens in patients 44 years of age and older), is one component of a comprehensive surveillance program. It is not intended to diagnose infection nor to guide or monitor treatment. Performed at [facility], [place] [address], [place], [place] [address]   Aerobic/Anaerobic Culture w Gram Stain (surgical/deep wound)     Status: None (Preliminary result)   Collection Time: 02/12/22  1:51 PM   Specimen: Soft Tissue, Other  Result Value Ref Range Status   Specimen Description TISSUE HIP LEFT  Final   Special Requests A  Final   Gram Stain   Final    RARE WBC PRESENT, PREDOMINANTLY MONONUCLEAR NO ORGANISMS SEEN CRITICAL RESULT CALLED TO, READ BACK BY AND VERIFIED WITH: RN Julieta Gutting [id number] AT 0623 BY CM    Culture   Final    NO GROWTH 2 DAYS NO ANAEROBES ISOLATED; CULTURE IN PROGRESS FOR 5 DAYS Performed at [facility], [place] [address] [place], [place] [address]    Report Status PENDING  Incomplete  Aerobic/Anaerobic Culture w Gram Stain (surgical/deep wound)     Status: None (Preliminary result)   Collection Time: 02/12/22  2:08 PM   Specimen: Soft Tissue, Other  Result Value Ref Range Status   Specimen Description TISSUE LEFT FEMUR CANAL  Final   Special Requests B  Final   Gram Stain   Final    RARE WBC PRESENT,BOTH PMN AND MONONUCLEAR NO ORGANISMS SEEN Gram Stain Report Called to,Read Back By and Verified With: DR.MARSHONIE [id number] '@1510'$   FH    Culture   Final    NO GROWTH 3 DAYS NO ANAEROBES ISOLATED; CULTURE IN PROGRESS FOR 5 DAYS Performed at [facility], [place] [address] [place], [place] [address]    Report Status PENDING  Incomplete  Aerobic/Anaerobic Culture w Gram Stain (surgical/deep wound)     Status: None (Preliminary result)   Collection Time: 02/12/22  2:39 PM   Specimen: Soft Tissue, Other  Result Value Ref Range Status   Specimen Description TISSUE LEFT HIP  Final   Special Requests SAMPLE NO 1 PT ON VANC, UNASYN  Final   Gram Stain NO WBC SEEN NO ORGANISMS SEEN   Final   Culture   Final    NO GROWTH 2 DAYS NO ANAEROBES ISOLATED; CULTURE IN PROGRESS FOR 5 DAYS Performed at [facility], [place] [address] [place], [place] [address]    Report Status PENDING  Incomplete  Aerobic/Anaerobic Culture w Gram Stain (surgical/deep wound)     Status: None (Preliminary result)   Collection Time: 02/12/22  2:44 PM   Specimen: Soft Tissue, Other  Result Value Ref Range Status   Specimen Description TISSUE LEFT HIP  Final   Special Requests SAMPLE NO 2 PT ON VANC, UNASYN  Final   Gram Stain NO WBC SEEN NO ORGANISMS SEEN   Final   Culture   Final    NO GROWTH 3 DAYS NO ANAEROBES ISOLATED; CULTURE IN PROGRESS FOR 5 DAYS Performed at [facility], [place] [address], [place],  [address]  Report Status PENDING  Incomplete  Aerobic/Anaerobic Culture w Gram Stain (surgical/deep wound)     Status: None (Preliminary result)   Collection Time: 02/12/22  2:44 PM   Specimen: Soft Tissue, Other  Result Value Ref Range Status   Specimen Description TISSUE LEFT HIP  Final   Special Requests SAMPLE NO 3 PT ON VANC , UNASYN  Final   Gram Stain NO WBC SEEN NO ORGANISMS SEEN   Final   Culture   Final    NO GROWTH 2 DAYS NO ANAEROBES ISOLATED; CULTURE IN PROGRESS FOR 5 DAYS Performed at [place] [address] [place], [place] [address]    Report Status PENDING  Incomplete     Radiology  Studies: No results found.   Scheduled Meds:  allopurinol  100 mg Oral Daily   cycloSPORINE  1 drop Both Eyes Daily   enoxaparin (LOVENOX) injection  30 mg Subcutaneous Q24H   ferrous sulfate  325 mg Oral Q breakfast   folic acid  1 mg Oral Daily   isosorbide mononitrate  90 mg Oral Daily   linaclotide  290 mcg Oral q morning   mometasone-formoterol  2 puff Inhalation BID   pantoprazole  40 mg Oral BID   polyethylene glycol  17 g Oral BID   rosuvastatin  20 mg Oral QPM   senna-docusate  1 tablet Oral BID   sucralfate  1 g Oral TID WC & HS   triamcinolone cream   Topical BID   Continuous Infusions:  ampicillin-sulbactam (UNASYN) IV Stopped (02/15/22 8453)     LOS: 9 days    Bonnell Public, MD [facility] If 7PM-7AM, please contact night-coverage [email or web address]  02/15/2022, 1:52 PM

## 2022-02-15 NOTE — Progress Notes (Signed)
     Subjective: Patient reports doing well this morning. Worked with PT yesterday. Needed a lot of assistance with transfer but in good spirits.  Hgb 8.1 today. Denies new n/t. Intra-op cxs x5 with NGTD. Remains on Unasyn.  Objective:   VITALS:   Vitals:   02/14/22 1108 02/14/22 1318 02/14/22 1400 02/15/22 0009  BP: (!) 109/44 (!) 108/49 (!) 105/49 (!) 125/53  Pulse: 86 86 85 82  Resp: '18 18 17 17  '$ Temp: 98.8 F (37.1 C) 99.5 F (37.5 C) 98.5 F (36.9 C) 99.4 F (37.4 C)  TempSrc: Oral Oral Oral Oral  SpO2:  95%  99%  Weight:      Height:        Sensation intact distally Intact pulses distally Dorsiflexion/Plantar flexion intact Incision: dressing C/D/I Compartment soft   Lab Results  Component Value Date   WBC 9.2 02/15/2022   HGB 8.1 (L) 02/15/2022   HCT 23.7 (L) 02/15/2022   MCV 90.5 02/15/2022   PLT 148 (L) 02/15/2022   BMET    Component Value Date/Time   NA 136 02/15/2022 0357   NA 136 09/02/2021 1221   K 3.4 (L) 02/15/2022 0357   CL 100 02/15/2022 0357   CO2 25 02/15/2022 0357   GLUCOSE 120 (H) 02/15/2022 0357   BUN 69 (H) 02/15/2022 0357   BUN 85 (HH) 09/02/2021 1221   CREATININE 3.06 (H) 02/15/2022 0357   CREATININE 1.05 04/04/2014 1014   CALCIUM 8.5 (L) 02/15/2022 0357   CALCIUM 9.3 04/25/2021 0626   EGFR 11 (L) 09/02/2021 1221   GFRNONAA 15 (L) 02/15/2022 0357      Xray: THA revision components in good position, no adverse features.  Assessment/Plan: 3 Days Post-Op   Principal Problem:   Pain from implanted hardware Active Problems:   HTN (hypertension), benign   Gastritis without bleeding   Cardiomyopathy (HCC)   OSA (obstructive sleep apnea)   Mixed hypercholesterolemia and hypertriglyceridemia   CAD S/P percutaneous coronary angioplasty   Chronic diastolic CHF (congestive heart failure) (HCC)   CHB (complete heart block) (HCC)   Atrial fibrillation (HCC)   ESRD (end stage renal disease) ([place])   Diabetes mellitus without  complication (HCC)   Thrombocytopenia (HCC)   Irritable bowel syndrome with constipation   Congestive heart failure (CHF) (HCC)   Supplemental oxygen dependent   Hypertension   CHF (congestive heart failure) (HCC)   Diabetic renal disease ([place])   Pure hypercholesterolemia   Physical deconditioning   Chronic hypoxemic respiratory failure (HCC)   Pressure injury of back, stage 2 (HCC)  S/p L hip hardware removal conversion to THA and ORIF greater trochanter fracture 02/12/22   Post op recs: WB: 50% WBAT LLE, posterior hip precautions x 6 weeks, no active abduction. Abx: Vancomycin + Unasyn continue abx pending intra-op cxs Imaging: PACU pelvis Xray Dressing: Aquacell, keep intact until follow up DVT prophylaxis: Lovenox daily starting postop day 1 x 4 weeks Follow up: 2 weeks after surgery for a wound check with Dr. Zachery Dakins at [facility].  Address: [address] [place], [place], [place] [address]  Office Phone: [phone]    Willaim Sheng 02/15/2022, 7:32 AM   Charlies Constable, MD  Contact information:   [phone] 7am-5pm epic message Dr. Zachery Dakins, or call office for patient follow up: [phone] After hours and holidays please check [email or web address] for group call information for [facility]

## 2022-02-16 DIAGNOSIS — T85848A Pain due to other internal prosthetic devices, implants and grafts, initial encounter: Secondary | ICD-10-CM | POA: Diagnosis not present

## 2022-02-16 LAB — RENAL FUNCTION PANEL
Albumin: 1.9 g/dL — ABNORMAL LOW (ref 3.5–5.0)
Anion gap: 10 (ref 5–15)
BUN: 72 mg/dL — ABNORMAL HIGH (ref 8–23)
CO2: 27 mmol/L (ref 22–32)
Calcium: 8.6 mg/dL — ABNORMAL LOW (ref 8.9–10.3)
Chloride: 99 mmol/L (ref 98–111)
Creatinine, Ser: 3.22 mg/dL — ABNORMAL HIGH (ref 0.44–1.00)
GFR, Estimated: 14 mL/min — ABNORMAL LOW (ref >60)
Glucose, Bld: 121 mg/dL — ABNORMAL HIGH (ref 70–99)
Phosphorus: 3.8 mg/dL (ref 2.5–4.6)
Potassium: 3.4 mmol/L — ABNORMAL LOW (ref 3.5–5.1)
Sodium: 136 mmol/L (ref 135–145)

## 2022-02-16 LAB — GLUCOSE, CAPILLARY
Glucose-Capillary: 107 mg/dL — ABNORMAL HIGH (ref 70–99)
Glucose-Capillary: 129 mg/dL — ABNORMAL HIGH (ref 70–99)
Glucose-Capillary: 171 mg/dL — ABNORMAL HIGH (ref 70–99)

## 2022-02-16 NOTE — Progress Notes (Signed)
     Subjective: Patient reports doing well this morning. Up to recliner yesterday for couple hours. Reports pain in the hip is decreasing. Intra-op cxs x5 with NGTD. Remains on Unasyn -today last day.  Objective:   VITALS:   Vitals:   02/15/22 0009 02/15/22 0841 02/15/22 1700 02/15/22 2221  BP: (!) 125/53 (!) 107/36 (!) 101/48 105/61  Pulse: 82 88 92 88  Resp: 17   18  Temp: 99.4 F (37.4 C) 98.8 F (37.1 C) 98.3 F (36.8 C) 98.6 F (37 C)  TempSrc: Oral Oral Oral Oral  SpO2: 99% 98% 93% 100%  Weight:      Height:        Sensation intact distally Intact pulses distally Dorsiflexion/Plantar flexion intact Incision: dressing C/D/I Compartment soft   Lab Results  Component Value Date   WBC 9.2 02/15/2022   HGB 8.1 (L) 02/15/2022   HCT 23.7 (L) 02/15/2022   MCV 90.5 02/15/2022   PLT 148 (L) 02/15/2022   BMET    Component Value Date/Time   NA 136 02/16/2022 0331   NA 136 09/02/2021 1221   K 3.4 (L) 02/16/2022 0331   CL 99 02/16/2022 0331   CO2 27 02/16/2022 0331   GLUCOSE 121 (H) 02/16/2022 0331   BUN 72 (H) 02/16/2022 0331   BUN 85 (HH) 09/02/2021 1221   CREATININE 3.22 (H) 02/16/2022 0331   CREATININE 1.05 04/04/2014 1014   CALCIUM 8.6 (L) 02/16/2022 0331   CALCIUM 9.3 04/25/2021 0626   EGFR 11 (L) 09/02/2021 1221   GFRNONAA 14 (L) 02/16/2022 0331      Xray: THA revision components in good position, no adverse features.  Assessment/Plan: 4 Days Post-Op   Principal Problem:   Pain from implanted hardware Active Problems:   HTN (hypertension), benign   Gastritis without bleeding   Cardiomyopathy (HCC)   OSA (obstructive sleep apnea)   Mixed hypercholesterolemia and hypertriglyceridemia   CAD S/P percutaneous coronary angioplasty   Chronic diastolic CHF (congestive heart failure) (HCC)   CHB (complete heart block) (HCC)   Atrial fibrillation (HCC)   ESRD (end stage renal disease) ([place])   Diabetes mellitus without complication (HCC)    Thrombocytopenia (HCC)   Irritable bowel syndrome with constipation   Congestive heart failure (CHF) (HCC)   Supplemental oxygen dependent   Hypertension   CHF (congestive heart failure) (HCC)   Diabetic renal disease ([place])   Pure hypercholesterolemia   Physical deconditioning   Chronic hypoxemic respiratory failure (HCC)   Pressure injury of back, stage 2 (HCC)  S/p L hip hardware removal conversion to THA and ORIF greater trochanter fracture 02/12/22   Post op recs: WB: 50% WBAT LLE, posterior hip precautions x 6 weeks, no active abduction. Abx: Vancomycin + Unasyn continue abx pending intra-op cxs Imaging: PACU pelvis Xray Dressing: Aquacell, keep intact until follow up DVT prophylaxis: Lovenox daily starting postop day 1 x 4 weeks Follow up: 2 weeks after surgery for a wound check with Dr. Zachery Dakins at [facility].  Address: [address] [place], [place], [place] [address]  Office Phone: [phone]    [name] 02/16/2022, 8:05 AM   Charlies Constable, MD  Contact information:   [phone] 7am-5pm epic message Dr. Zachery Dakins, or call office for patient follow up: [phone] After hours and holidays please check [email or web address] for group call information for [facility]

## 2022-02-16 NOTE — Progress Notes (Signed)
PROGRESS NOTE    [name]  JSE:[id number] DOB: Sep 19, 1945 DOA: 02/06/2022 PCP: Lujean Amel, MD   Brief Narrative: [name] is a 77 years old female with past medical history of CAD status post CABG, HFpEF, Obesity, adrenal insufficiency, Diabetes mellitus, HTN, CKD stage IV,  OSA/obesity-hypoventilation syndrome on 3 L O2 chronically, complete heart block s/p PPM/ICD and recent history of left hip and left superior pubic ramus fracture who had a follow-up scan showed atrophic none union.  After Christmas patient started developing right hip pain and could not walk so was admitted hospital for  total hip arthroplasty.  Imaging showed cephalomedullary screw  migration of the intertrochanteric nailing status post fall 2 days prior to admission.  Patient was seen by orthopedics and underwent surgical intervention 02/12/2022.  Nephrology was also consulted for previous history of dialysis.  Postoperatively, patient required 2 units of packed RBC.  Physical therapy pending at this time.  02/16/2022: Patient seen.  No complaints.  Patient is awaiting short-term SNF.  Pursue disposition.  Assessment & Plan:   Principal Problem:   Pain from implanted hardware Active Problems:   HTN (hypertension), benign   Gastritis without bleeding   Cardiomyopathy (HCC)   OSA (obstructive sleep apnea)   Mixed hypercholesterolemia and hypertriglyceridemia   CAD S/P percutaneous coronary angioplasty   Chronic diastolic CHF (congestive heart failure) (HCC)   CHB (complete heart block) (HCC)   Atrial fibrillation (HCC)   ESRD (end stage renal disease) ([place])   Diabetes mellitus without complication (HCC)   Thrombocytopenia (HCC)   Irritable bowel syndrome with constipation   Congestive heart failure (CHF) (HCC)   Supplemental oxygen dependent   Hypertension   CHF (congestive heart failure) (HCC)   Diabetic renal disease ([place])   Pure hypercholesterolemia   Physical deconditioning   Chronic hypoxemic  respiratory failure (HCC)   Pressure injury of back, stage 2 (HCC)  Right hip pain/migration of hardware; left intertrochanteric hip fracture with nonunion and fixation failure. Orthopedics was consulted and patient underwent removal of left cephalomedullary femoral nail, conversion of left total hip arthroplasty, ORIF of greater trochanter fracture with trochanteric hook plate on 0/08/3708.  Further management as per orthopedics.  Intraoperative cultures negative in 2 days. 02/15/2022: Pursue disposition.  Chronic hypoxemic respiratory failure secondary to COPD and asthma. Patient is on 3 L of oxygen at baseline. Continue bronchodilators. 624: Stable.  Acute postoperative anemia.  Patient received 1 unit of packed RBC yesterday.  Will transfuse 1 more unit of packed RBC today.  Hemoglobin today at 6.9. 02/15/2022: Hemoglobin today is 8.1 g/dL.  CKD 4  Baseline creatinine of 2.5-3.2.  After surgery in February, patient was on dialysis for 3 months..  Nephrology on board due to this.  Has been started on Lasix 80 twice daily.  Continue strict intake and output charting Daily weights..  Avoid nephrotoxic medication.  Follow nephrology recommendations.  Latest creatinine of 2.9. 02/15/2022: Stable CKD 4.  OSA (obstructive sleep apnea) Not on CPAP.     Physical deconditioning Physical therapy pending after surgery.   HTN (hypertension), benign Continue Imdur with holding parameters.  Latest blood pressure at 105/49, has been having episodes of hypotension.   Hypokalemia Replenished and improved.  Latest potassium of 3.7.   Gout On allopurinol   Chronic diastolic and systolic EF 62-69 % CHF (congestive heart failure) (HCC)/CAD -Stable, Continue with  Imdur, Crestor. Aspirin and Plavix on hold due to postoperative anemia requiring blood transfusion x2 units..  CHF is compensated at  this time.  Resume aspirin Plavix when okay with surgery.  CHB (complete heart block) Status post permanent  pacemaker placement.   GERD On sucralfate and Protonix.  Anemia of chronic kidney disease. On iron and folic acid as outpatient.  Hypercholesterolemia Continue Crestor.  Constipation; continue bowel regimen.  Klebsiella pneumonia UTI. On Unasyn IV until Sunday, 02/16/2022.   DVT prophylaxis: SCD  Code Status: Full code  Family Communication: Spoke with the patient's son at bedside.  Disposition Plan: At this time, will need PT evaluation.  Status is: Inpatient  Remains inpatient appropriate because: management of hip hardware migration.,  Status post surgical intervention.  PT evaluation pending.  Consultants:  Orthopedics Nephrology  Procedures:  removal of left cephalomedullary femoral nail, conversion of left total hip arthroplasty, ORIF of greater trochanter fracture with trochanteric hook plate on 05/18/8889 by Dr. Zachery Dakins Transfusion of packed RBC 2 units.  Antimicrobials:  Unasyn IV  Subjective: No new complaints.  Objective: Vitals:   02/15/22 1700 02/15/22 2221 02/16/22 0815 02/16/22 1651  BP: (!) 101/48 105/61 (!) 105/46 (!) 121/59  Pulse: 92 88  84  Resp:  '18 18 18  '$ Temp: 98.3 F (36.8 C) 98.6 F (37 C) 98 F (36.7 C) 98.1 F (36.7 C)  TempSrc: Oral Oral Oral Oral  SpO2: 93% 100%    Weight:      Height:        Intake/Output Summary (Last 24 hours) at 02/16/2022 1706 Last data filed at 02/16/2022 1124 Gross per 24 hour  Intake 340 ml  Output 1200 ml  Net -860 ml    Filed Weights   02/06/22 1435 02/06/22 2332 02/12/22 1059  Weight: 85.7 kg 89.9 kg 85.7 kg   Body mass index is 33.48 kg/m.   General: Patient is morbidly obese.  Not in any distress.  Awake and alert. HEENT: Patient is pale.  No jaundice. Neck: Supple.  JVD is difficult to assess. Lungs: Decreased air entry. CVS: S1-S2, irregularly irregular. Abdomen: Morbidly obese.  Soft and nontender. Neuro: Awake and alert. Extremities: Fullness of the ankle.  Data Reviewed: I  have personally reviewed following labs and imaging studies  CBC: Recent Labs  Lab 02/11/22 0732 02/12/22 0313 02/12/22 1540 02/12/22 1648 02/12/22 1729 02/13/22 0351 02/14/22 0456 02/15/22 0357  WBC 7.7 7.9  --   --   --  8.1 9.7 9.2  HGB 9.1* 8.7*   < > 10.5* 9.5* 6.7* 6.9* 8.1*  HCT 28.7* 29.7*   < > 31.0* 28.0* 22.2* 21.1* 23.7*  MCV 92.9 95.2  --   --   --  96.1 91.3 90.5  PLT 173 172  --   --   --  134* 136* 148*   < > = values in this interval not displayed.    Basic Metabolic Panel: Recent Labs  Lab 02/10/22 0257 02/11/22 0732 02/12/22 0313 02/12/22 1540 02/13/22 0351 02/13/22 0352 02/14/22 0458 02/15/22 0357 02/16/22 0331  NA 137   < > 137   < > 137 138 137 136 136  K 2.9*   < > 3.7   < > 4.2 4.2 3.7 3.4* 3.4*  CL 95*   < > 97*   < > 100 99 99 100 99  CO2 29   < > 31  --  '25 27 27 25 27  '$ GLUCOSE 118*   < > 120*   < > 164* 163* 129* 120* 121*  BUN 69*   < > 60*   < >  62* 59* 71* 69* 72*  CREATININE 2.88*   < > 2.48*   < > 2.71* 2.81* 2.98* 3.06* 3.22*  CALCIUM 9.3   < > 9.4  --  8.7* 8.8* 8.4* 8.5* 8.6*  MG 1.8  --   --   --  1.9  --  1.9 1.8  --   PHOS  --   --  3.8  --   --  5.4* 4.4 3.5 3.8   < > = values in this interval not displayed.    GFR: Estimated Creatinine Clearance: 15.2 mL/min (A) (by C-G formula based on SCr of 3.22 mg/dL (H)). Liver Function Tests: Recent Labs  Lab 02/12/22 0313 02/13/22 0352 02/14/22 0458 02/15/22 0357 02/16/22 0331  ALBUMIN 2.3* 2.8* 2.4* 2.2* 1.9*    No results for input(s): "LIPASE", "AMYLASE" in the last 168 hours. No results for input(s): "AMMONIA" in the last 168 hours. Coagulation Profile: No results for input(s): "INR", "PROTIME" in the last 168 hours. Cardiac Enzymes: No results for input(s): "CKTOTAL", "CKMB", "CKMBINDEX", "TROPONINI" in the last 168 hours. BNP (last 3 results) No results for input(s): "PROBNP" in the last 8760 hours. HbA1C: No results for input(s): "HGBA1C" in the last 72  hours. CBG: Recent Labs  Lab 02/15/22 1158 02/15/22 1623 02/15/22 2224 02/16/22 0806 02/16/22 1643  GLUCAP 143* 137* 146* 107* 129*    Lipid Profile: No results for input(s): "CHOL", "HDL", "LDLCALC", "TRIG", "CHOLHDL", "LDLDIRECT" in the last 72 hours. Thyroid Function Tests: No results for input(s): "TSH", "T4TOTAL", "FREET4", "T3FREE", "THYROIDAB" in the last 72 hours. Anemia Panel: No results for input(s): "VITAMINB12", "FOLATE", "FERRITIN", "TIBC", "IRON", "RETICCTPCT" in the last 72 hours.  Sepsis Labs: No results for input(s): "PROCALCITON", "LATICACIDVEN" in the last 168 hours.  Recent Results (from the past 240 hour(s))  Body fluid culture w Gram Stain     Status: None   Collection Time: 02/07/22  3:01 PM   Specimen: PATH Cytology Misc. fluid; Body Fluid  Result Value Ref Range Status   Specimen Description FLUID LEFT HIP  Final   Special Requests NONE  Final   Gram Stain   Final    FEW WBC PRESENT, PREDOMINANTLY MONONUCLEAR NO ORGANISMS SEEN    Culture   Final    NO GROWTH 3 DAYS Performed at [facility], [address], [place], [place] [address]    Report Status 02/10/2022 FINAL  Final  Urine Culture     Status: Abnormal   Collection Time: 02/09/22 11:49 AM   Specimen: Urine, Clean Catch  Result Value Ref Range Status   Specimen Description URINE, CLEAN CATCH  Final   Special Requests   Final    NONE Performed at [facility], [place] [address], [place], [place] [address]    Culture >=100,000 COLONIES/mL KLEBSIELLA PNEUMONIAE (A)  Final   Report Status 02/11/2022 FINAL  Final   Organism ID, Bacteria KLEBSIELLA PNEUMONIAE (A)  Final      Susceptibility   Klebsiella pneumoniae - MIC*    AMPICILLIN RESISTANT Resistant     CEFAZOLIN <=4 SENSITIVE Sensitive     CEFEPIME <=0.12 SENSITIVE Sensitive     CEFTRIAXONE <=0.25 SENSITIVE Sensitive     CIPROFLOXACIN <=0.25 SENSITIVE Sensitive     GENTAMICIN <=1 SENSITIVE Sensitive     IMIPENEM <=0.25  SENSITIVE Sensitive     NITROFURANTOIN <=16 SENSITIVE Sensitive     TRIMETH/SULFA <=20 SENSITIVE Sensitive     AMPICILLIN/SULBACTAM <=2 SENSITIVE Sensitive  PIP/TAZO <=4 SENSITIVE Sensitive     * >=100,000 COLONIES/mL KLEBSIELLA PNEUMONIAE  Surgical pcr screen     Status: None   Collection Time: 02/12/22 12:23 AM   Specimen: Nasal Mucosa; Nasal Swab  Result Value Ref Range Status   MRSA, PCR NEGATIVE NEGATIVE Final   Staphylococcus aureus NEGATIVE NEGATIVE Final    Comment: (NOTE) The Xpert SA Assay (FDA approved for NASAL specimens in patients 79 years of age and older), is one component of a comprehensive surveillance program. It is not intended to diagnose infection nor to guide or monitor treatment. Performed at [facility], [place] [address] [place], [place] [address]   Aerobic/Anaerobic Culture w Gram Stain (surgical/deep wound)     Status: None (Preliminary result)   Collection Time: 02/12/22  1:51 PM   Specimen: Soft Tissue, Other  Result Value Ref Range Status   Specimen Description TISSUE HIP LEFT  Final   Special Requests A  Final   Gram Stain   Final    RARE WBC PRESENT, PREDOMINANTLY MONONUCLEAR NO ORGANISMS SEEN CRITICAL RESULT CALLED TO, READ BACK BY AND VERIFIED WITH: RN M GORDON [id number] AT 1437 BY CM    Culture   Final    NO GROWTH 4 DAYS NO ANAEROBES ISOLATED; CULTURE IN PROGRESS FOR 5 DAYS Performed at [facility], [place] [address] [place], [place] [address]    Report Status PENDING  Incomplete  Aerobic/Anaerobic Culture w Gram Stain (surgical/deep wound)     Status: None (Preliminary result)   Collection Time: 02/12/22  2:08 PM   Specimen: Soft Tissue, Other  Result Value Ref Range Status   Specimen Description TISSUE LEFT FEMUR CANAL  Final   Special Requests B  Final   Gram Stain   Final    RARE WBC PRESENT,BOTH PMN AND MONONUCLEAR NO ORGANISMS SEEN Gram Stain Report Called to,Read Back By and Verified With: DR.MARSHONIE [id number] '@1510'$   FH    Culture   Final    NO GROWTH 4 DAYS NO ANAEROBES ISOLATED; CULTURE IN PROGRESS FOR 5 DAYS Performed at [facility], [place] [address], [place], [place] [address]    Report Status PENDING  Incomplete  Aerobic/Anaerobic Culture w Gram Stain (surgical/deep wound)     Status: None (Preliminary result)   Collection Time: 02/12/22  2:39 PM   Specimen: Soft Tissue, Other  Result Value Ref Range Status   Specimen Description TISSUE LEFT HIP  Final   Special Requests SAMPLE NO 1 PT ON VANC, UNASYN  Final   Gram Stain NO WBC SEEN NO ORGANISMS SEEN   Final   Culture   Final    NO GROWTH 4 DAYS NO ANAEROBES ISOLATED; CULTURE IN PROGRESS FOR 5 DAYS Performed at [facility], [place] [address], [place], [place] [address]    Report Status PENDING  Incomplete  Aerobic/Anaerobic Culture w Gram Stain (surgical/deep wound)     Status: None (Preliminary result)   Collection Time: 02/12/22  2:44 PM   Specimen: Soft Tissue, Other  Result Value Ref Range Status   Specimen Description TISSUE LEFT HIP  Final   Special Requests SAMPLE NO 2 PT ON VANC, UNASYN  Final   Gram Stain NO WBC SEEN NO ORGANISMS SEEN   Final   Culture   Final    NO GROWTH 4 DAYS NO ANAEROBES ISOLATED; CULTURE IN PROGRESS FOR 5 DAYS Performed at [facility], [place] [address], [place],  [address]  Report Status PENDING  Incomplete  Aerobic/Anaerobic Culture w Gram Stain (surgical/deep wound)     Status: None (Preliminary result)   Collection Time: 02/12/22  2:44 PM   Specimen: Soft Tissue, Other  Result Value Ref Range Status   Specimen Description TISSUE LEFT HIP  Final   Special Requests SAMPLE NO 3 PT ON VANC , UNASYN  Final   Gram Stain NO WBC SEEN NO ORGANISMS SEEN   Final   Culture   Final    NO GROWTH 4 DAYS NO ANAEROBES ISOLATED; CULTURE IN PROGRESS FOR 5 DAYS Performed at [place] [address], [place], [place] [address]    Report Status PENDING  Incomplete     Radiology  Studies: No results found.   Scheduled Meds:  allopurinol  100 mg Oral Daily   cycloSPORINE  1 drop Both Eyes Daily   enoxaparin (LOVENOX) injection  30 mg Subcutaneous Q24H   ferrous sulfate  325 mg Oral Q breakfast   folic acid  1 mg Oral Daily   isosorbide mononitrate  90 mg Oral Daily   linaclotide  290 mcg Oral q morning   mometasone-formoterol  2 puff Inhalation BID   pantoprazole  40 mg Oral BID   polyethylene glycol  17 g Oral BID   rosuvastatin  20 mg Oral QPM   senna-docusate  1 tablet Oral BID   sucralfate  1 g Oral TID WC & HS   triamcinolone cream   Topical BID   Continuous Infusions:  ampicillin-sulbactam (UNASYN) IV 3 g (02/16/22 0762)     LOS: 10 days    Bonnell Public, MD [facility] If 7PM-7AM, please contact night-coverage [email or web address]  02/16/2022, 5:06 PM

## 2022-02-17 ENCOUNTER — Inpatient Hospital Stay (HOSPITAL_COMMUNITY): Payer: Medicare Other

## 2022-02-17 DIAGNOSIS — T85848A Pain due to other internal prosthetic devices, implants and grafts, initial encounter: Secondary | ICD-10-CM | POA: Diagnosis not present

## 2022-02-17 LAB — AEROBIC/ANAEROBIC CULTURE W GRAM STAIN (SURGICAL/DEEP WOUND)
Culture: NO GROWTH
Culture: NO GROWTH
Culture: NO GROWTH
Culture: NO GROWTH
Culture: NO GROWTH
Gram Stain: NONE SEEN
Gram Stain: NONE SEEN
Gram Stain: NONE SEEN

## 2022-02-17 LAB — RENAL FUNCTION PANEL
Albumin: 1.8 g/dL — ABNORMAL LOW (ref 3.5–5.0)
Anion gap: 11 (ref 5–15)
BUN: 71 mg/dL — ABNORMAL HIGH (ref 8–23)
CO2: 25 mmol/L (ref 22–32)
Calcium: 8.7 mg/dL — ABNORMAL LOW (ref 8.9–10.3)
Chloride: 99 mmol/L (ref 98–111)
Creatinine, Ser: 3.12 mg/dL — ABNORMAL HIGH (ref 0.44–1.00)
GFR, Estimated: 15 mL/min — ABNORMAL LOW (ref >60)
Glucose, Bld: 114 mg/dL — ABNORMAL HIGH (ref 70–99)
Phosphorus: 3.7 mg/dL (ref 2.5–4.6)
Potassium: 3.2 mmol/L — ABNORMAL LOW (ref 3.5–5.1)
Sodium: 135 mmol/L (ref 135–145)

## 2022-02-17 LAB — GLUCOSE, CAPILLARY: Glucose-Capillary: 145 mg/dL — ABNORMAL HIGH (ref 70–99)

## 2022-02-17 MED ORDER — CLOPIDOGREL BISULFATE 75 MG PO TABS
75.0000 mg | ORAL_TABLET | Freq: Every day | ORAL | Status: DC
  Administered 2022-02-17 – 2022-02-19 (×3): 75 mg via ORAL
  Filled 2022-02-17 (×3): qty 1

## 2022-02-17 MED ORDER — POTASSIUM CHLORIDE CRYS ER 20 MEQ PO TBCR
40.0000 meq | EXTENDED_RELEASE_TABLET | Freq: Once | ORAL | Status: AC
  Administered 2022-02-17: 40 meq via ORAL
  Filled 2022-02-17: qty 2

## 2022-02-17 MED ORDER — ASPIRIN 81 MG PO TBEC
81.0000 mg | DELAYED_RELEASE_TABLET | Freq: Every day | ORAL | Status: DC
  Administered 2022-02-17: 81 mg via ORAL
  Filled 2022-02-17: qty 1

## 2022-02-17 NOTE — Progress Notes (Signed)
PROGRESS NOTE MISHKA STEGEMANN  SAY:[id number] DOB: [birth year] DOA: 02/06/2022 PCP: Lujean Amel, MD   Brief Narrative/Hospital Course: 77 years old female with past medical history of CAD status post CABG, HFpEF, Obesity, adrenal insufficiency, Diabetes mellitus, HTN, CKD stage IV,  OSA/obesity-hypoventilation syndrome on 3 L O2 chronically, complete heart block s/p PPM/ICD and recent history of left hip and left superior pubic ramus fracture who had a follow-up scan showed atrophic none union.  After Christmas patient started developing right hip pain and could not walk so was admitted hospital for  total hip arthroplasty. Imaging showed cephalomedullary screw  migration of the intertrochanteric nailing status post fall 2 days prior to admission.  Patient was seen by orthopedics and underwent surgical intervention 02/12/2022.  Nephrology was also consulted for previous history of dialysis.  Postoperatively, patient required 2 units of packed RBC.  Seen by PT OT.  At this time patient remains medically stable afebrile.  Recommending skilled nursing facility.     Subjective: Seen and examined Worked w/ PTOT and on bedside chair No new complaints   Assessment and Plan: Principal Problem:   Pain from implanted hardware Active Problems:   Cardiomyopathy ([place])   OSA (obstructive sleep apnea)   Thrombocytopenia (HCC)   HTN (hypertension), benign   Gastritis without bleeding   Mixed hypercholesterolemia and hypertriglyceridemia   CAD S/P percutaneous coronary angioplasty   Chronic diastolic CHF (congestive heart failure) (HCC)   CHB (complete heart block) (HCC)   Atrial fibrillation (HCC)   ESRD (end stage renal disease) ([place])   Diabetes mellitus without complication (HCC)   Irritable bowel syndrome with constipation   Congestive heart failure (CHF) (HCC)   Supplemental oxygen dependent   Hypertension   CHF (congestive heart failure) (HCC)   Diabetic renal disease ([place])   Pure  hypercholesterolemia   Physical deconditioning   Chronic hypoxemic respiratory failure (HCC)   Pressure injury of back, stage 2 (HCC)  Right hip pain with migration of hardware Left intertrochanteric hip fracture with nonunion and fixation failure: Seen by orthopedics underwent removal of left cephalomedullary femoral nail, conversion of left THA, ORIF of greater trochanter fracture with trochanteric hook plate 02/12/2022.  Intraoperative cultures negative so far.  Continue PT OT pain control and DVT prophylaxis as per orthopedics.  Pending placement.   Chronic hypoxic respiratory failure COPD/asthma: Continue 3l [place],continue bronchodilators.  Not in exacerbation  Acute postoperative anemia  Anemia of chronic kidney disease on iron and folate as outpatient monitor H&H  received 2 units PRBC so far.  Monitor H&H in am. Recent Labs  Lab 02/12/22 1648 02/12/22 1729 02/13/22 0351 02/14/22 0456 02/15/22 0357  HGB 10.5* 9.5* 6.7* 6.9* 8.1*  HCT 31.0* 28.0* 22.2* 21.1* 23.7*    CKD 4 w/baseline creatinine of 2.5-3.2. After surgery in February, patient was on dialysis for 3 months.  Seen by nephrology, started Lasix continue to monitor intake output Daily weight BMP in avoid nephrotoxic medication. Recent Labs  Lab 02/13/22 0352 02/14/22 0458 02/15/22 0357 02/16/22 0331 02/17/22 0404  BUN 59* 71* 69* 72* 71*  CREATININE 2.81* 2.98* 3.06* 3.22* 3.12*    OSA not on CPAP Physical deconditioning/debility continue PT OT  Essential hypertension BP stable continue current Imdur Hyperlipidemia continue Crestor  Hypokalemia replete again and recheck Recent Labs  Lab 02/13/22 0352 02/14/22 0458 02/15/22 0357 02/16/22 0331 02/17/22 0404  K 4.2 3.7 3.4* 3.4* 3.2*    Gout: Cont allopurinol  Chronic diastolic and systolic CHF EF 23-55% CAD: Currently euvolemic  and compensated continue her statin hopefully resume aspirin Plavix if okay with surgery> I sent message to Dr. Zachery Dakins Net  IO Since Admission: -7,579 mL [02/17/22 1102]   CHB s; s/p  PPM   GERD:On sucralfate and Protonix. Hld: cont crestor. Constipation; continue bowel regimen. Klebsiella pneumonia UTI: completed Unasyn 02/16/2022. Class I Obesity:Patient's Body mass index is 33.48 kg/m. : Will benefit with PCP follow-up, weight loss  healthy lifestyle and outpatient sleep evaluation.   DVT prophylaxis: enoxaparin (LOVENOX) injection 30 mg Start: 02/13/22 2200 SCDs Start: 02/06/22 2257 Code Status:   Code Status: Full Code Family Communication: plan of care discussed with patient at bedside.  Patient status is: inpatient  because of pending SNF Level of care: Med-Surg  Dispo:The patient is from: home w/ sos how works            Anticipated disposition: SNF in next 24 hrs  Objective: Vitals last 24 hrs: Vitals:   02/16/22 1651 02/16/22 2010 02/17/22 0522 02/17/22 0859  BP: (!) 121/59 (!) 104/53 126/61 (!) 108/51  Pulse: 84 65 89 87  Resp: '18 20 18 19  '$ Temp: 98.1 F (36.7 C) 98.6 F (37 C) 98.9 F (37.2 C) 97.9 F (36.6 C)  TempSrc: Oral Oral Oral Oral  SpO2:  99% 98% 100%  Weight:      Height:       Weight change:   Physical Examination: General exam: alert awake, older than stated age HEENT:Oral mucosa moist, Ear/Nose WNL grossly Respiratory system: bilaterally clear BS, no use of accessory muscle Cardiovascular system: S1 & S2 +, No JVD. Gastrointestinal system: Abdomen soft,NT,ND, BS+ Nervous System:Alert, awake, moving extremities. Extremities: LE edema mild ,distal peripheral pulses palpable.  Skin: No rashes,no icterus. MSK: Normal muscle bulk,tone, power  Medications reviewed:  Scheduled Meds:  allopurinol  100 mg Oral Daily   cycloSPORINE  1 drop Both Eyes Daily   enoxaparin (LOVENOX) injection  30 mg Subcutaneous Q24H   ferrous sulfate  325 mg Oral Q breakfast   folic acid  1 mg Oral Daily   isosorbide mononitrate  90 mg Oral Daily   linaclotide  290 mcg Oral q morning    mometasone-formoterol  2 puff Inhalation BID   pantoprazole  40 mg Oral BID   polyethylene glycol  17 g Oral BID   rosuvastatin  20 mg Oral QPM   senna-docusate  1 tablet Oral BID   sucralfate  1 g Oral TID WC & HS   triamcinolone cream   Topical BID   Continuous Infusions: Diet Order             Diet heart healthy/carb modified Room service appropriate? Yes; Fluid consistency: Thin  Diet effective now                  Intake/Output Summary (Last 24 hours) at 02/17/2022 1029 Last data filed at 02/17/2022 0600 Gross per 24 hour  Intake 920 ml  Output 700 ml  Net 220 ml   Net IO Since Admission: -7,579 mL [02/17/22 1029]  Wt Readings from Last 3 Encounters:  02/12/22 85.7 kg  01/13/22 89.4 kg  11/05/21 98 kg     Unresulted Labs (From admission, onward)     Start     Ordered   02/12/22 0500  Renal function panel  Daily,   R      02/11/22 0841          Data Reviewed: I have personally reviewed following labs and imaging studies  CBC: Recent Labs  Lab 02/11/22 0732 02/12/22 0313 02/12/22 1540 02/12/22 1648 02/12/22 1729 02/13/22 0351 02/14/22 0456 02/15/22 0357  WBC 7.7 7.9  --   --   --  8.1 9.7 9.2  HGB 9.1* 8.7*   < > 10.5* 9.5* 6.7* 6.9* 8.1*  HCT 28.7* 29.7*   < > 31.0* 28.0* 22.2* 21.1* 23.7*  MCV 92.9 95.2  --   --   --  96.1 91.3 90.5  PLT 173 172  --   --   --  134* 136* 148*   < > = values in this interval not displayed.   Basic Metabolic Panel: Recent Labs  Lab 02/13/22 0351 02/13/22 0352 02/14/22 0458 02/15/22 0357 02/16/22 0331 02/17/22 0404  NA 137 138 137 136 136 135  K 4.2 4.2 3.7 3.4* 3.4* 3.2*  CL 100 99 99 100 99 99  CO2 '25 27 27 25 27 25  '$ GLUCOSE 164* 163* 129* 120* 121* 114*  BUN 62* 59* 71* 69* 72* 71*  CREATININE 2.71* 2.81* 2.98* 3.06* 3.22* 3.12*  CALCIUM 8.7* 8.8* 8.4* 8.5* 8.6* 8.7*  MG 1.9  --  1.9 1.8  --   --   PHOS  --  5.4* 4.4 3.5 3.8 3.7   GFR: Estimated Creatinine Clearance: 15.7 mL/min (A) (by C-G formula  based on SCr of 3.12 mg/dL (H)). Liver Function Tests: Recent Labs  Lab 02/13/22 0352 02/14/22 0458 02/15/22 0357 02/16/22 0331 02/17/22 0404  ALBUMIN 2.8* 2.4* 2.2* 1.9* 1.8*  CBG: Recent Labs  Lab 02/15/22 2224 02/16/22 0806 02/16/22 1146 02/16/22 1643 02/16/22 2241  GLUCAP 146* 107* 145* 129* 171*   Recent Results (from the past 240 hour(s))  Body fluid culture w Gram Stain     Status: None   Collection Time: 02/07/22  3:01 PM   Specimen: PATH Cytology Misc. fluid; Body Fluid  Result Value Ref Range Status   Specimen Description FLUID LEFT HIP  Final   Special Requests NONE  Final   Gram Stain   Final    FEW WBC PRESENT, PREDOMINANTLY MONONUCLEAR NO ORGANISMS SEEN    Culture   Final    NO GROWTH 3 DAYS Performed at [facility], [address] [place], [place] [address]    Report Status 02/10/2022 FINAL  Final  Urine Culture     Status: Abnormal   Collection Time: 02/09/22 11:49 AM   Specimen: Urine, Clean Catch  Result Value Ref Range Status   Specimen Description URINE, CLEAN CATCH  Final   Special Requests   Final    NONE Performed at [facility], [place] [address], [place], [place] [address]    Culture >=100,000 COLONIES/mL KLEBSIELLA PNEUMONIAE (A)  Final   Report Status 02/11/2022 FINAL  Final   Organism ID, Bacteria KLEBSIELLA PNEUMONIAE (A)  Final      Susceptibility   Klebsiella pneumoniae - MIC*    AMPICILLIN RESISTANT Resistant     CEFAZOLIN <=4 SENSITIVE Sensitive     CEFEPIME <=0.12 SENSITIVE Sensitive     CEFTRIAXONE <=0.25 SENSITIVE Sensitive     CIPROFLOXACIN <=0.25 SENSITIVE Sensitive     GENTAMICIN <=1 SENSITIVE Sensitive     IMIPENEM <=0.25 SENSITIVE Sensitive     NITROFURANTOIN <=16 SENSITIVE Sensitive     TRIMETH/SULFA <=20 SENSITIVE Sensitive     AMPICILLIN/SULBACTAM <=2 SENSITIVE Sensitive     PIP/TAZO <=4 SENSITIVE Sensitive     * >=100,000 COLONIES/mL KLEBSIELLA PNEUMONIAE  Surgical pcr screen  Status: None    Collection Time: 02/12/22 12:23 AM   Specimen: Nasal Mucosa; Nasal Swab  Result Value Ref Range Status   MRSA, PCR NEGATIVE NEGATIVE Final   Staphylococcus aureus NEGATIVE NEGATIVE Final    Comment: (NOTE) The Xpert SA Assay (FDA approved for NASAL specimens in patients 100 years of age and older), is one component of a comprehensive surveillance program. It is not intended to diagnose infection nor to guide or monitor treatment. Performed at [facility], [place] [address] [place], [place] [address]   Aerobic/Anaerobic Culture w Gram Stain (surgical/deep wound)     Status: None (Preliminary result)   Collection Time: 02/12/22  1:51 PM   Specimen: Soft Tissue, Other  Result Value Ref Range Status   Specimen Description TISSUE HIP LEFT  Final   Special Requests A  Final   Gram Stain   Final    RARE WBC PRESENT, PREDOMINANTLY MONONUCLEAR NO ORGANISMS SEEN CRITICAL RESULT CALLED TO, READ BACK BY AND VERIFIED WITH: RN M GORDON [id number] AT 1437 BY CM    Culture   Final    NO GROWTH 4 DAYS NO ANAEROBES ISOLATED; CULTURE IN PROGRESS FOR 5 DAYS Performed at [facility], [place] [address] [place], [place] [address]    Report Status PENDING  Incomplete  Aerobic/Anaerobic Culture w Gram Stain (surgical/deep wound)     Status: None (Preliminary result)   Collection Time: 02/12/22  2:08 PM   Specimen: Soft Tissue, Other  Result Value Ref Range Status   Specimen Description TISSUE LEFT FEMUR CANAL  Final   Special Requests B  Final   Gram Stain   Final    RARE WBC PRESENT,BOTH PMN AND MONONUCLEAR NO ORGANISMS SEEN Gram Stain Report Called to,Read Back By and Verified With: DR.MARSHONIE [id number] '@1510'$  FH    Culture   Final    NO GROWTH 4 DAYS NO ANAEROBES ISOLATED; CULTURE IN PROGRESS FOR 5 DAYS Performed at [facility], [place] [address] [place], [place] [address]    Report Status PENDING  Incomplete  Aerobic/Anaerobic Culture w Gram Stain (surgical/deep wound)     Status:  None (Preliminary result)   Collection Time: 02/12/22  2:39 PM   Specimen: Soft Tissue, Other  Result Value Ref Range Status   Specimen Description TISSUE LEFT HIP  Final   Special Requests SAMPLE NO 1 PT ON VANC, UNASYN  Final   Gram Stain NO WBC SEEN NO ORGANISMS SEEN   Final   Culture   Final    NO GROWTH 4 DAYS NO ANAEROBES ISOLATED; CULTURE IN PROGRESS FOR 5 DAYS Performed at [facility], [place] [address] [place], [place] [address]    Report Status PENDING  Incomplete  Aerobic/Anaerobic Culture w Gram Stain (surgical/deep wound)     Status: None (Preliminary result)   Collection Time: 02/12/22  2:44 PM   Specimen: Soft Tissue, Other  Result Value Ref Range Status   Specimen Description TISSUE LEFT HIP  Final   Special Requests SAMPLE NO 2 PT ON VANC, UNASYN  Final   Gram Stain NO WBC SEEN NO ORGANISMS SEEN   Final   Culture   Final    NO GROWTH 4 DAYS NO ANAEROBES ISOLATED; CULTURE IN PROGRESS FOR 5 DAYS Performed at [facility], [place] [address], [place], [place] [address]    Report Status PENDING  Incomplete  Aerobic/Anaerobic Culture w Gram Stain (surgical/deep wound)     Status: None (Preliminary  result)   Collection Time: 02/12/22  2:44 PM   Specimen: Soft Tissue, Other  Result Value Ref Range Status   Specimen Description TISSUE LEFT HIP  Final   Special Requests SAMPLE NO 3 PT ON VANC , UNASYN  Final   Gram Stain NO WBC SEEN NO ORGANISMS SEEN   Final   Culture   Final    NO GROWTH 4 DAYS NO ANAEROBES ISOLATED; CULTURE IN PROGRESS FOR 5 DAYS Performed at [facility], [address], [place], [place] [address]    Report Status PENDING  Incomplete    Antimicrobials: Anti-infectives (From admission, onward)    Start     Dose/Rate Route Frequency Ordered Stop   02/12/22 0600  ceFAZolin (ANCEF) IVPB 2g/100 mL premix  Status:  Discontinued        2 g 200 mL/hr over 30 Minutes Intravenous On call to O.R. 02/11/22 2328 02/11/22 2335   02/12/22  0600  vancomycin (VANCOCIN) IVPB 1000 mg/200 mL premix        1,000 mg 200 mL/hr over 60 Minutes Intravenous On call to O.R. 02/11/22 2328 02/12/22 1239   02/09/22 1130  Ampicillin-Sulbactam (UNASYN) 3 g in sodium chloride 0.9 % 100 mL IVPB        3 g 200 mL/hr over 30 Minutes Intravenous Every 12 hours 02/09/22 1031 02/17/22 0535      Culture/Microbiology    Component Value Date/Time   SDES TISSUE LEFT HIP 02/12/2022 1444   SDES TISSUE LEFT HIP 02/12/2022 1444   SPECREQUEST SAMPLE NO 2 PT ON VANC, UNASYN 02/12/2022 1444   SPECREQUEST SAMPLE NO 3 PT ON VANC , UNASYN 02/12/2022 1444   CULT  02/12/2022 1444    NO GROWTH 4 DAYS NO ANAEROBES ISOLATED; CULTURE IN PROGRESS FOR 5 DAYS Performed at [facility], [place] [address], [place], [place] [address]    CULT  02/12/2022 1444    NO GROWTH 4 DAYS NO ANAEROBES ISOLATED; CULTURE IN PROGRESS FOR 5 DAYS Performed at [facility] [address], [place], [place] [address]    REPTSTATUS PENDING 02/12/2022 1444   REPTSTATUS PENDING 02/12/2022 1444  Radiology Studies: No results found.   LOS: 11 days   Antonieta Pert, MD [facility]  02/17/2022, 10:29 AM

## 2022-02-17 NOTE — TOC Initial Note (Signed)
Transition of Care [facility]) - Initial/Assessment Note    Patient Details  Name: [name] MRN: [id number] Date of Birth: 01/06/1946  Transition of Care [facility]) CM/SW Contact:    Joanne Chars, LCSW Phone Number: 02/17/2022, 11:18 AM  Clinical Narrative:    CSW met with pt regarding DC recommendation for SNF.  Pt agreeable to this, medicare choice document provided, [facility] is first choice, does not want [facility].  Pt lives with son Bernadene Person [facility] currently in place.  Permission given to speak with all three sons.  Pt is vaccinated for covid with boosters.  Referral sent out for SNF, reached out to [facility].             Expected Discharge Plan: Skilled Nursing Facility Barriers to Discharge: SNF Pending bed offer   Patient Goals and CMS Choice Patient states their goals for this hospitalization and ongoing recovery are:: walk with a walker   Choice offered to / list presented to : Patient      Expected Discharge Plan and Services In-house Referral: Clinical Social Work   Post Acute Care Choice: [place] Living arrangements for the past 2 months: [place]                                      Prior Living Arrangements/Services Living arrangements for the past 2 months: Single Family Home Lives with:: Adult Children (lives with son Thayer Jew) Patient language and need for interpreter reviewed:: Yes Do you feel safe going back to the place where you live?: Yes      Need for Family Participation in Patient Care: Yes (Comment) Care giver support system in place?: Yes (comment) Current home services: Home PT, Home OT ([facility] in place currently) Criminal Activity/Legal Involvement Pertinent to Current Situation/Hospitalization: No - Comment as needed  Activities of Daily Living Home Assistive Devices/Equipment: Oxygen, Eyeglasses, Dentures (specify type), Walker (specify type), Raised toilet seat with rails ADL Screening  (condition at time of admission) Patient's cognitive ability adequate to safely complete daily activities?: Yes Is the patient deaf or have difficulty hearing?: No Does the patient have difficulty seeing, even when wearing glasses/contacts?: No Does the patient have difficulty concentrating, remembering, or making decisions?: No Patient able to express need for assistance with ADLs?: Yes Does the patient have difficulty dressing or bathing?: No Independently performs ADLs?: Yes (appropriate for developmental age) Communication: Independent Dressing (OT): Independent Grooming: Independent Feeding: Independent Bathing: Independent Toileting: Independent with device (comment) In/Out Bed: Independent with device (comment) Walks in Home: Independent with device (comment) Does the patient have difficulty walking or climbing stairs?: Yes Weakness of Legs: None Weakness of Arms/Hands: None  Permission Sought/Granted Permission sought to share information with : Family Supports Permission granted to share information with : Yes, Verbal Permission Granted  Share Information with NAME: all 3 sons: Dianna Rossetti  Permission granted to share info w AGENCY: SNF        Emotional Assessment Appearance:: Appears stated age Attitude/Demeanor/Rapport: Engaged Affect (typically observed): Appropriate, Pleasant Orientation: : Oriented to Self, Oriented to Place, Oriented to  Time, Oriented to Situation      Admission diagnosis:  Pain from implanted hardware [T85.848A] Pain from implanted hardware, initial encounter [Q20.601V] Acute pain of left hip [M25.552] Patient Active Problem List   Diagnosis Date Noted   Pain from implanted hardware 02/06/2022   Diabetic ulcer of toe ([place])  11/18/2021   Pain due to onychomycosis of toenails of both feet 11/18/2021   Pressure injury of back, stage 2 ([place]) 11/03/2021   Acute on chronic respiratory failure with hypoxia ([place]) 11/02/2021   History of  fracture of left hip 09/10/2021   Iatrogenic adrenal insufficiency ([place]) 09/10/2021   T9 vertebral fracture ([place]) 09/09/2021   Acute kidney injury superimposed on chronic kidney disease ([place]) 09/09/2021   Chronic hypoxemic respiratory failure ([place]) 09/09/2021   Acute combined systolic and diastolic heart failure (HCC)    Acute CHF (congestive heart failure) ([place]) 08/04/2021   Callus 07/19/2021   Hypervolemia associated with renal insufficiency 06/11/2021   Physical deconditioning 06/11/2021   Subcutaneous fat necrosis    Pressure injury of skin 04/13/2021   Closed left hip fracture ([place]) secondary to fall at home 04/03/2021   Closed fracture of left superior pubic ramus ([place]) 04/03/2021   Chronic kidney disease, stage III B (moderate) ([place]) 04/03/2021   Fall at home, initial encounter 04/03/2021   Hypokalemia 04/03/2021   Obesity hypoventilation syndrome ([place]) 04/02/2021   Primary osteoarthritis, left ankle and foot 02/05/2021   Allergy to shellfish 01/15/2021   Decreased estrogen level 01/15/2021   Diabetic renal disease ([place]) 01/15/2021   Gallstones 01/15/2021   Idiopathic gout 01/15/2021   Mild intermittent asthma 01/15/2021   Pure hypercholesterolemia 01/15/2021   CHF (congestive heart failure) ([place]) 01/07/2021   Chronic diastolic heart failure ([place]) 12/31/2020   Nasal congestion with rhinorrhea 10/24/2020   Carotid stenosis 10/01/2020   Ischemic cardiomyopathy 09/27/2020   Hypertension 09/19/2020   Acute left-sided weakness 09/16/2020   Blood blister 08/01/2020   Elevated troponin 06/01/2019   Irritable bowel syndrome with constipation 10/21/2018   Chronic pain 07/30/2018   Congestive heart failure (CHF) ([place]) 07/30/2018   Supplemental oxygen dependent 07/30/2018   Leukopenia 05/03/2018   Thrombocytopenia ([place]) 05/03/2018   Pneumonia due to human metapneumovirus 05/02/2018   Chest pain of uncertain etiology 00/93/8182   Allergic rhinitis 04/13/2018   S/P laparoscopic  cholecystectomy 04/09/2018   Respiratory failure ([place]) 03/07/2018   Polyp of cecum    Polyp of transverse colon    Positive colorectal cancer screening using Cologuard test    Dysphagia    Acute on chronic renal failure ([place]) 11/20/2017   Coronary artery disease of native artery of native heart with stable angina pectoris ([place]) 11/20/2017   Diabetes mellitus without complication ([place]) 99/37/1696   Abdominal pain, epigastric 11/20/2017   Vasomotor rhinitis 10/16/2016   Right facial numbness 03/05/2016   ESRD (end stage renal disease) ([place]) 03/05/2016   Atrial fibrillation ([place]) 10/24/2015   Chest pain with moderate risk of acute coronary syndrome 10/23/2015   History of TIA (transient ischemic attack) 10/22/2015   CHB (complete heart block) ([place]) 06/22/2015   Allergic drug rash due to anti-infective agent 04/29/2015   Fatigue 02/14/2015   Chronic diastolic CHF (congestive heart failure) ([place]) 02/14/2015   Cellulitis 12/21/2014   S/P CABG x 5 11/29/2014   CAD S/P percutaneous coronary angioplasty    Diarrhea 07/12/2014   Abdominal pain 07/12/2014   Acute on chronic diastolic heart failure ([place]) 11/04/2013   Mixed hypercholesterolemia and hypertriglyceridemia 04/22/2013   Vertigo 04/22/2013   Dyspnea on exertion 08/25/2012   Allergic to IV contrast 07/23/2012   Unstable angina ([place]) 07/22/2012   Obesity 11/22/2011   Cardiomyopathy ([place]) 11/22/2011   Gastroesophageal reflux disease 11/22/2011   Back pain 11/22/2011   OSA (obstructive sleep apnea) 11/22/2011   HEMORRHOIDS-EXTERNAL 02/21/2010  NAUSEA 02/21/2010   ABDOMINAL PAIN -GENERALIZED 02/21/2010   PERSONAL HX COLONIC POLYPS 02/21/2010   ANEMIA 04/16/2007   HTN (hypertension), benign 04/16/2007   DIVERTICULOSIS, COLON 04/16/2007   ARTHRITIS 04/16/2007   INTERNAL HEMORRHOIDS WITHOUT MENTION COMP 04/12/2007   Sleep apnea 04/12/2007   Cardiac resynchronization therapy pacemaker (CRT-P) in place 04/12/2007   Gastritis  without bleeding 12/14/2006   COLONIC POLYPS, HYPERPLASTIC 09/27/2002   FATTY LIVER DISEASE 02/16/2002   PCP:  Lujean Amel, MD Pharmacy:   [facility] [address] ([address]), [place] [address] [place]) [place] [address] Phone: [phone] Fax: [place], [place] [address] Phone: [phone] Fax: [phone]  [facility] - [place], [place] - [address] [place] [address] Phone: [phone] Fax: [phone]     Social Determinants of Health ([place]) Social History: [place]: No Food Insecurity (02/06/2022)  Recent Concern: [place] Present (11/19/2021)  Housing: Low Risk  (02/06/2022)  Transportation Needs: Unmet Transportation Needs (02/06/2022)  Utilities: Not At Risk (02/06/2022)  Depression (PHQ2-9): Low Risk  (11/19/2021)  Financial Resource Strain: High Risk (12/18/2016)  Physical Activity: Inactive (12/18/2016)  Social Connections: Moderately Integrated (02/05/2021)  Stress: Stress Concern Present (12/18/2016)  Tobacco Use: Medium Risk (02/12/2022)   SDOH Interventions:     Readmission Risk Interventions    01/10/2021    9:47 AM  Readmission Risk Prevention Plan  Transportation Screening Complete  PCP or Specialist Appt within 3-5 Days Complete  HRI or [place] Complete  Social Work Consult for [place] Planning/Counseling Complete  Palliative Care Screening Not Applicable  Medication Review Press photographer) Complete

## 2022-02-17 NOTE — Progress Notes (Signed)
Physical Therapy Treatment Patient Details Name: [name] MRN: [id number] DOB: 02-Nov-1945 Today's Date: 02/17/2022   History of Present Illness Pt is a 77 y/o F admitted on 02/06/22. Pt with recent hx of L hip & L superior pubic ramus fx & had f/u scan showing atrophic non union & pt started developing R hip pain & inability to walk. Pt was admitted & imaging showed cephalomedullary screw migration of the intertrochanteric nailing s/p fall 2 days prior to admission. Pt underwent removal of left cephalomedullary femoral nail, conversion of left total hip arthroplasty, ORIF of greater trochanter fracture with trochanteric hook plate on 07/13/9474. PMH: CAD s/p CABG, HFpEF, obesity, adrenal insufficiency, DM, HTN, CKD3, OSA/obesity-hypoventilation syndrome on 3L O2 chronically, complete heart block s/p PPM/ICD    PT Comments    Pt is progressing with goals. Able to perform activities at decreased need for physical assistance. Currently pt is limited by pain and WB precautions. Fatigues quickly trying to maintain 50% WB on the LLE with taking a few steps toward the chair due to overall generalized weakness. Pt responds well to education. Due to pt current functional status, prior level of function, home set up and available assistance at home currently recommending SNF level skilled physical therapy services on discharge from acute care hospital setting. Pt demonstrates some shortness of breathe after transfer O2 on per home requirements.    Recommendations for follow up therapy are one component of a multi-disciplinary discharge planning process, led by the attending physician.  Recommendations may be updated based on patient status, additional functional criteria and insurance authorization.  Follow Up Recommendations  Skilled nursing-short term rehab (<3 hours/day) Can patient physically be transported by private vehicle: No   Assistance Recommended at Discharge Frequent or constant  Supervision/Assistance  Patient can return home with the following Two people to help with walking and/or transfers;Help with stairs or ramp for entrance;Assist for transportation;Assistance with cooking/housework   Equipment Recommendations  None recommended by PT    Recommendations for Other Services       Precautions / Restrictions Precautions Precautions: Fall;Posterior Hip Precaution Comments: LLE no active hip abduction Restrictions Weight Bearing Restrictions: Yes LLE Weight Bearing: Partial weight bearing LLE Partial Weight Bearing Percentage or Pounds: 50     Mobility  Bed Mobility Overal bed mobility: Needs Assistance Bed Mobility: Supine to Sit     Supine to sit: Mod assist, +2 for physical assistance     General bed mobility comments: Pt was able to assist with RLE and rotating trunk toward the R with Mod a for trunk rotation and then multi modal cues for grabbing the rail to decrease need for physical assistance. Patient Response: Cooperative  Transfers Overall transfer level: Needs assistance Equipment used: Rolling walker (2 wheels) Transfers: Sit to/from Stand, Bed to chair/wheelchair/BSC Sit to Stand: Mod assist, +2 physical assistance Stand pivot transfers: Mod assist, +2 physical assistance, +2 safety/equipment         General transfer comment: Pt requires assistance with sequencing with gait and pushing through UE in order to maintain 50% WB precautions. Extra time was spent educating pt on 50% WB status prior to stepping with pt able to maintain precautions.    Ambulation/Gait Ambulation/Gait assistance: Mod assist Gait Distance (Feet): 2 Feet Assistive device: Rolling walker (2 wheels) Gait Pattern/deviations: Step-to pattern, Decreased stance time - left       General Gait Details: very short distance in ordedr to maintain WB precautions, needs assistance sequencing gait with AD for  each step. Mod A in order to maintain precautions and safely  maintain balance at this time.       Balance Overall balance assessment: Needs assistance Sitting-balance support: Feet supported, Bilateral upper extremity supported Sitting balance-Leahy Scale: Fair Sitting balance - Comments: supervision static sitting   Standing balance support: Bilateral upper extremity supported, During functional activity, Reliant on assistive device for balance Standing balance-Leahy Scale: Poor        Cognition Arousal/Alertness: Awake/alert Behavior During Therapy: WFL for tasks assessed/performed Overall Cognitive Status: Within Functional Limits for tasks assessed               General Comments General comments (skin integrity, edema, etc.): Pt uses O2 at home. Pt does well with maitnaining WB precautions following education.      Pertinent Vitals/Pain Pain Assessment Pain Assessment: 0-10 Pain Score: 6  Pain Descriptors / Indicators: Discomfort, Guarding, Aching, Grimacing Pain Intervention(s): Monitored during session, Repositioned, Limited activity within patient's tolerance     PT Goals (current goals can now be found in the care plan section) Acute Rehab PT Goals Patient Stated Goal: decreased pain PT Goal Formulation: With patient Time For Goal Achievement: 02/28/22 Potential to Achieve Goals: Good Progress towards PT goals: Progressing toward goals    Frequency    Min 3X/week      PT Plan Current plan remains appropriate       AM-PAC PT "6 Clicks" Mobility   Outcome Measure  Help needed turning from your back to your side while in a flat bed without using bedrails?: A Lot Help needed moving from lying on your back to sitting on the side of a flat bed without using bedrails?: A Lot Help needed moving to and from a bed to a chair (including a wheelchair)?: A Lot Help needed standing up from a chair using your arms (e.g., wheelchair or bedside chair)?: A Lot Help needed to walk in hospital room?: Total Help needed  climbing 3-5 steps with a railing? : Total 6 Click Score: 10    End of Session Equipment Utilized During Treatment: Gait belt;Oxygen Activity Tolerance: Patient tolerated treatment well;Patient limited by pain Patient left: in chair;with call bell/phone within reach;with family/visitor present Nurse Communication: Mobility status PT Visit Diagnosis: Pain;Difficulty in walking, not elsewhere classified (R26.2);Other abnormalities of gait and mobility (R26.89);Muscle weakness (generalized) (M62.81) Pain - Right/Left: Left Pain - part of body: Hip     Time: 1006-1030 PT Time Calculation (min) (ACUTE ONLY): 24 min  Charges:  $Therapeutic Activity: 23-37 mins                     Tomma Rakers, DPT, CLT  Acute Rehabilitation Services Office: [phone] (Secure chat preferred)    Ander Purpura 02/17/2022, 11:15 AM

## 2022-02-17 NOTE — Hospital Course (Addendum)
77 years old female with past medical history of CAD status post CABG, HFpEF, Obesity, adrenal insufficiency, Diabetes mellitus, HTN, CKD stage IV,  OSA/obesity-hypoventilation syndrome on 3 L O2 chronically, complete heart block s/p PPM/ICD and recent history of left hip and left superior pubic ramus fracture who had a follow-up scan showed atrophic none union.  After Christmas patient started developing right hip pain and could not walk so was admitted hospital for  total hip arthroplasty. Imaging showed cephalomedullary screw  migration of the intertrochanteric nailing status post fall 2 days prior to admission.  Patient was seen by orthopedics and underwent surgical intervention 02/12/2022.  Nephrology was also consulted for previous history of dialysis.  Postoperatively, patient required 2 units of packed RBC.  Seen by PT OT.  At this time patient remains medically stable afebrile.  Recommending skilled nursing facility.

## 2022-02-17 NOTE — Progress Notes (Signed)
Patient called RN to room and explained that she felt like her left hip may have been "re-injured" after being transported back to her bed in the hoyer lift. She states that the sling hurt badly and she feels like something may be wrong with her leg. Dr. Lupita Leash notified and imaging ordered. Patient and son request that the patient only be moved via the lift if she is unable to stand and that it be used as a last resort. Patient states that she was able to move from the bed to the chair with 2 assist and a walker and would like to continue to mobilize that way to maximize her rehab.

## 2022-02-17 NOTE — NC FL2 (Signed)
[place] LEVEL OF CARE FORM     IDENTIFICATION  Patient Name: [name] Birthdate: December 08, 1945 Sex: female Admission Date (Current Location): 02/06/2022  [place] and [place] Number:  [name] [id number] [place] and Address:  [facility] [place] [address] [place], [place] [address]      Provider Number: [id number]  Attending Physician Name and Address:  Antonieta Pert, MD  Relative Name and Phone Number:  Phyillis, Dascoli [phone]    Current Level of Care: Hospital Recommended Level of Care: [place] Prior Approval Number:    Date Approved/Denied:   PASRR Number: [id number]  Discharge Plan: SNF    Current Diagnoses: Patient Active Problem List   Diagnosis Date Noted   Pain from implanted hardware 02/06/2022   Diabetic ulcer of toe ([place]) 11/18/2021   Pain due to onychomycosis of toenails of both feet 11/18/2021   Pressure injury of back, stage 2 ([place]) 11/03/2021   Acute on chronic respiratory failure with hypoxia ([place]) 11/02/2021   History of fracture of left hip 09/10/2021   Iatrogenic adrenal insufficiency ([place]) 09/10/2021   T9 vertebral fracture ([place]) 09/09/2021   Acute kidney injury superimposed on chronic kidney disease ([place]) 09/09/2021   Chronic hypoxemic respiratory failure ([place]) 09/09/2021   Acute combined systolic and diastolic heart failure ([place])    Acute CHF (congestive heart failure) ([place]) 08/04/2021   Callus 07/19/2021   Hypervolemia associated with renal insufficiency 06/11/2021   Physical deconditioning 06/11/2021   Subcutaneous fat necrosis    Pressure injury of skin 04/13/2021   Closed left hip fracture ([place]) secondary to fall at home 04/03/2021   Closed fracture of left superior pubic ramus ([place]) 04/03/2021   Chronic kidney disease, stage III B (moderate) ([place]) 04/03/2021   Fall at home, initial encounter 04/03/2021   Hypokalemia 04/03/2021   Obesity hypoventilation syndrome ([place])  04/02/2021   Primary osteoarthritis, left ankle and foot 02/05/2021   Allergy to shellfish 01/15/2021   Decreased estrogen level 01/15/2021   Diabetic renal disease ([place]) 01/15/2021   Gallstones 01/15/2021   Idiopathic gout 01/15/2021   Mild intermittent asthma 01/15/2021   Pure hypercholesterolemia 01/15/2021   CHF (congestive heart failure) ([place]) 01/07/2021   Chronic diastolic heart failure ([place]) 12/31/2020   Nasal congestion with rhinorrhea 10/24/2020   Carotid stenosis 10/01/2020   Ischemic cardiomyopathy 09/27/2020   Hypertension 09/19/2020   Acute left-sided weakness 09/16/2020   Blood blister 08/01/2020   Elevated troponin 06/01/2019   Irritable bowel syndrome with constipation 10/21/2018   Chronic pain 07/30/2018   Congestive heart failure (CHF) ([place]) 07/30/2018   Supplemental oxygen dependent 07/30/2018   Leukopenia 05/03/2018   Thrombocytopenia ([place]) 05/03/2018   Pneumonia due to human metapneumovirus 05/02/2018   Chest pain of uncertain etiology 19/62/2297   Allergic rhinitis 04/13/2018   S/P laparoscopic cholecystectomy 04/09/2018   Respiratory failure ([place]) 03/07/2018   Polyp of cecum    Polyp of transverse colon    Positive colorectal cancer screening using Cologuard test    Dysphagia    Acute on chronic renal failure ([place]) 11/20/2017   Coronary artery disease of native artery of native heart with stable angina pectoris ([place]) 11/20/2017   Diabetes mellitus without complication ([place]) 98/92/1194   Abdominal pain, epigastric 11/20/2017   Vasomotor rhinitis 10/16/2016   Right facial numbness 03/05/2016   ESRD (end stage renal disease) ([place]) 03/05/2016   Atrial fibrillation ([place]) 10/24/2015   Chest pain with moderate risk of acute coronary syndrome 10/23/2015  History of TIA (transient ischemic attack) 10/22/2015   CHB (complete heart block) (HCC) 06/22/2015   Allergic drug rash due to anti-infective agent 04/29/2015   Fatigue 02/14/2015   Chronic diastolic CHF  (congestive heart failure) ([place]) 02/14/2015   Cellulitis 12/21/2014   S/P CABG x 5 11/29/2014   CAD S/P percutaneous coronary angioplasty    Diarrhea 07/12/2014   Abdominal pain 07/12/2014   Acute on chronic diastolic heart failure ([place]) 11/04/2013   Mixed hypercholesterolemia and hypertriglyceridemia 04/22/2013   Vertigo 04/22/2013   Dyspnea on exertion 08/25/2012   Allergic to IV contrast 07/23/2012   Unstable angina (HCC) 07/22/2012   Obesity 11/22/2011   Cardiomyopathy ([place]) 11/22/2011   Gastroesophageal reflux disease 11/22/2011   Back pain 11/22/2011   OSA (obstructive sleep apnea) 11/22/2011   HEMORRHOIDS-EXTERNAL 02/21/2010   NAUSEA 02/21/2010   ABDOMINAL PAIN -GENERALIZED 02/21/2010   PERSONAL HX COLONIC POLYPS 02/21/2010   ANEMIA 04/16/2007   HTN (hypertension), benign 04/16/2007   DIVERTICULOSIS, COLON 04/16/2007   ARTHRITIS 04/16/2007   INTERNAL HEMORRHOIDS WITHOUT MENTION COMP 04/12/2007   Sleep apnea 04/12/2007   Cardiac resynchronization therapy pacemaker (CRT-P) in place 04/12/2007   Gastritis without bleeding 12/14/2006   COLONIC POLYPS, HYPERPLASTIC 09/27/2002   FATTY LIVER DISEASE 02/16/2002    Orientation RESPIRATION BLADDER Height & Weight     Self, Time, Situation, Place  O2 Incontinent, External catheter Weight: 189 lb (85.7 kg) Height:  '5\' 3"'$  (160 cm)  BEHAVIORAL SYMPTOMS/MOOD NEUROLOGICAL BOWEL NUTRITION STATUS      Continent Diet (see discharge summary)  AMBULATORY STATUS COMMUNICATION OF NEEDS Skin   Total Care Verbally Other (Comment) (redness)                       Personal Care Assistance Level of Assistance  Bathing, Feeding, Dressing Bathing Assistance: Independent Feeding assistance: Independent Dressing Assistance: Maximum assistance     Functional Limitations Info  Sight, Hearing, Speech Sight Info: Adequate Hearing Info: Adequate Speech Info: Adequate    SPECIAL CARE FACTORS FREQUENCY  PT (By licensed PT), OT (By  licensed OT)     PT Frequency: 5x week OT Frequency: 5x week            Contractures Contractures Info: Not present    Additional Factors Info  Code Status, Allergies Code Status Info: full Allergies Info: Ivp Dye (Iodinated Contrast Media), Shellfish Allergy, Sulfa Antibiotics, Iodine, Atorvastatin, Benadryl (Diphenhydramine), Colchicine, Doxycycline, Uloric (Febuxostat), Cephalexin, Zithromax (Azithromycin)           Current Medications (02/17/2022):  This is the current hospital active medication list Current Facility-Administered Medications  Medication Dose Route Frequency Provider Last Rate Last Admin   acetaminophen (TYLENOL) tablet 650 mg  650 mg Oral Q6H PRN Willaim Sheng, MD   650 mg at 02/13/22 1404   Or   acetaminophen (TYLENOL) suppository 650 mg  650 mg Rectal Q6H PRN Willaim Sheng, MD       albuterol (PROVENTIL) (2.5 MG/3ML) 0.083% nebulizer solution 2.5 mg  2.5 mg Nebulization Q6H PRN Willaim Sheng, MD   2.5 mg at 02/09/22 1744   allopurinol (ZYLOPRIM) tablet 100 mg  100 mg Oral Daily Willaim Sheng, MD   100 mg at 02/17/22 9509   calcium carbonate (TUMS - dosed in mg elemental calcium) chewable tablet 200 mg of elemental calcium  1 tablet Oral BID PRN Willaim Sheng, MD   200 mg of elemental calcium at 02/13/22 0139   cycloSPORINE (RESTASIS)  0.05 % ophthalmic emulsion 1 drop  1 drop Both Eyes Daily Willaim Sheng, MD   1 drop at 02/17/22 0923   enoxaparin (LOVENOX) injection 30 mg  30 mg Subcutaneous Q24H [name], Laxman, MD   30 mg at 02/16/22 2237   ferrous sulfate tablet 325 mg  325 mg Oral Q breakfast Willaim Sheng, MD   325 mg at 02/17/22 0922   fluticasone (FLONASE) 50 MCG/ACT nasal spray 1 spray  1 spray Each Nare Daily PRN Willaim Sheng, MD       folic acid (FOLVITE) tablet 1 mg  1 mg Oral Daily Willaim Sheng, MD   1 mg at 02/17/22 4650   HYDROmorphone (DILAUDID) injection 0.5-1 mg  0.5-1 mg  Intravenous Q4H PRN [name], Laxman, MD   1 mg at 02/13/22 0810   hydrOXYzine (ATARAX) tablet 25 mg  25 mg Oral BID PRN Willaim Sheng, MD   25 mg at 02/16/22 2237   isosorbide mononitrate (IMDUR) 24 hr tablet 90 mg  90 mg Oral Daily [name], Laxman, MD   90 mg at 02/17/22 3546   linaclotide (LINZESS) capsule 290 mcg  290 mcg Oral q morning Willaim Sheng, MD   290 mcg at 02/16/22 0954   methocarbamol (ROBAXIN) tablet 500 mg  500 mg Oral Q6H PRN Willaim Sheng, MD   500 mg at 02/17/22 0526   mometasone-formoterol (DULERA) 200-5 MCG/ACT inhaler 2 puff  2 puff Inhalation BID Willaim Sheng, MD   2 puff at 02/17/22 0923   ondansetron (ZOFRAN) tablet 4 mg  4 mg Oral Q6H PRN Willaim Sheng, MD   4 mg at 02/15/22 1112   Or   ondansetron (ZOFRAN) injection 4 mg  4 mg Intravenous Q6H PRN Willaim Sheng, MD       oxyCODONE (Oxy IR/ROXICODONE) immediate release tablet 5-10 mg  5-10 mg Oral Q4H PRN Willaim Sheng, MD   10 mg at 02/17/22 0526   pantoprazole (PROTONIX) EC tablet 40 mg  40 mg Oral BID Willaim Sheng, MD   40 mg at 02/17/22 5681   polyethylene glycol (MIRALAX / GLYCOLAX) packet 17 g  17 g Oral BID Willaim Sheng, MD   17 g at 02/16/22 2237   rosuvastatin (CRESTOR) tablet 20 mg  20 mg Oral QPM Willaim Sheng, MD   20 mg at 02/16/22 1751   senna-docusate (Senokot-S) tablet 1 tablet  1 tablet Oral BID Willaim Sheng, MD   1 tablet at 02/17/22 2751   simethicone (MYLICON) chewable tablet 80 mg  80 mg Oral QID PRN Willaim Sheng, MD       sucralfate (CARAFATE) tablet 1 g  1 g Oral TID WC & HS Willaim Sheng, MD   1 g at 02/17/22 7001   triamcinolone cream (KENALOG) 0.1 % cream   Topical BID Willaim Sheng, MD   Given at 02/17/22 [phone]     Discharge Medications: Please see discharge summary for a list of discharge medications.  Relevant Imaging Results:  Relevant Lab Results:   Additional Information SSN:  [id number].  Pt is vaccinated for covid with 3 boosters.  Joanne Chars, LCSW

## 2022-02-18 ENCOUNTER — Encounter (HOSPITAL_COMMUNITY): Payer: Self-pay | Admitting: Orthopedic Surgery

## 2022-02-18 DIAGNOSIS — T85848A Pain due to other internal prosthetic devices, implants and grafts, initial encounter: Secondary | ICD-10-CM | POA: Diagnosis not present

## 2022-02-18 LAB — RENAL FUNCTION PANEL
Albumin: 1.9 g/dL — ABNORMAL LOW (ref 3.5–5.0)
Anion gap: 13 (ref 5–15)
BUN: 68 mg/dL — ABNORMAL HIGH (ref 8–23)
CO2: 24 mmol/L (ref 22–32)
Calcium: 9.1 mg/dL (ref 8.9–10.3)
Chloride: 99 mmol/L (ref 98–111)
Creatinine, Ser: 3.29 mg/dL — ABNORMAL HIGH (ref 0.44–1.00)
GFR, Estimated: 14 mL/min — ABNORMAL LOW (ref >60)
Glucose, Bld: 109 mg/dL — ABNORMAL HIGH (ref 70–99)
Phosphorus: 3.5 mg/dL (ref 2.5–4.6)
Potassium: 3.8 mmol/L (ref 3.5–5.1)
Sodium: 136 mmol/L (ref 135–145)

## 2022-02-18 LAB — GLUCOSE, CAPILLARY: Glucose-Capillary: 172 mg/dL — ABNORMAL HIGH (ref 70–99)

## 2022-02-18 LAB — CBC
HCT: 23.7 % — ABNORMAL LOW (ref 36.0–46.0)
Hemoglobin: 7.6 g/dL — ABNORMAL LOW (ref 12.0–15.0)
MCH: 30.3 pg (ref 26.0–34.0)
MCHC: 32.1 g/dL (ref 30.0–36.0)
MCV: 94.4 fL (ref 80.0–100.0)
Platelets: 193 10*3/uL (ref 150–400)
RBC: 2.51 MIL/uL — ABNORMAL LOW (ref 3.87–5.11)
RDW: 15 % (ref 11.5–15.5)
WBC: 8.1 10*3/uL (ref 4.0–10.5)
nRBC: 0 % (ref 0.0–0.2)

## 2022-02-18 MED ORDER — ASPIRIN 81 MG PO TBEC
81.0000 mg | DELAYED_RELEASE_TABLET | Freq: Two times a day (BID) | ORAL | Status: DC
  Administered 2022-02-18 – 2022-02-19 (×3): 81 mg via ORAL
  Filled 2022-02-18 (×3): qty 1

## 2022-02-18 NOTE — TOC Progression Note (Signed)
Transition of Care [facility]) - Progression Note    Patient Details  Name: [name] MRN: [id number] Date of Birth: 02-04-1946  Transition of [facility]) CM/SW Contact  Joanne Chars, LCSW Phone Number: 02/18/2022, 2:57 PM  Clinical Narrative:   CSW met with pt and presented bed offers, [facility] cannot offer bed.  Pt wants to speak to son, called him on the phone, and then does accept offer at [facility].    CSW spoke with [facility] and she is not sure of available bed, will know more tomorrow.  Auth request submitted in [place] and approved: [id number]: 3 days: 1/10-1/12.      Expected Discharge Plan: [place] Barriers to Discharge: SNF Pending bed offer  Expected Discharge Plan and Services In-house Referral: Clinical Social Work   Post Acute Care Choice: [place] Living arrangements for the past 2 months: [place] Determinants of Health (SDOH) Interventions SDOH Screenings   Food Insecurity: No Food Insecurity (02/06/2022)  Recent Concern: [place] Present (11/19/2021)  Housing: Low Risk  (02/06/2022)  Transportation Needs: Unmet Transportation Needs (02/06/2022)  Utilities: Not At Risk (02/06/2022)  Depression (PHQ2-9): Low Risk  (11/19/2021)  Financial Resource Strain: High Risk (12/18/2016)  Physical Activity: Inactive (12/18/2016)  Social Connections: Moderately Integrated (02/05/2021)  Stress: Stress Concern Present (12/18/2016)  Tobacco Use: Medium Risk (02/18/2022)    Readmission Risk Interventions    01/10/2021    9:47 AM  Readmission Risk Prevention Plan  Transportation Screening Complete  PCP or Specialist Appt within 3-5 Days Complete  HRI or [place] Complete  Social Work Consult for [place] Planning/Counseling Complete  Palliative Care Screening Not Applicable  Medication Review Press photographer) Complete

## 2022-02-18 NOTE — Progress Notes (Signed)
PROGRESS NOTE [name]  JOI:[id number] DOB: November 15, 1945 DOA: 02/06/2022 PCP: Lujean Amel, MD   Brief Narrative/Hospital Course: 77 years old female with past medical history of CAD status post CABG, HFpEF, Obesity, adrenal insufficiency, Diabetes mellitus, HTN, CKD stage IV,  OSA/obesity-hypoventilation syndrome on 3 L O2 chronically, complete heart block s/p PPM/ICD and recent history of left hip and left superior pubic ramus fracture who had a follow-up scan showed atrophic none union.  After Christmas patient started developing right hip pain and could not walk so was admitted hospital for  total hip arthroplasty. Imaging showed cephalomedullary screw  migration of the intertrochanteric nailing status post fall 2 days prior to admission.  Patient was seen by orthopedics and underwent surgical intervention 02/12/2022.  Nephrology was also consulted for previous history of dialysis.  Postoperatively, patient required 2 units of packed RBC.  Seen by PT OT.  At this time patient remains medically stable afebrile.  Recommending skilled nursing facility.     Subjective: Seen and examined Upset abt mobility yesterday xray hip was stable post op changes This am she feels okay. She reports she wants to be able to be walk little bit more before she considers going to skilled nursing facility She wants to go to  [facility] near her house.  Assessment and Plan: Principal Problem:   Pain from implanted hardware Active Problems:   Cardiomyopathy (HCC)   OSA (obstructive sleep apnea)   Thrombocytopenia (HCC)   HTN (hypertension), benign   Gastritis without bleeding   Mixed hypercholesterolemia and hypertriglyceridemia   CAD S/P percutaneous coronary angioplasty   Chronic diastolic CHF (congestive heart failure) (HCC)   CHB (complete heart block) (HCC)   Atrial fibrillation (HCC)   ESRD (end stage renal disease) ([place])   Diabetes mellitus without complication (HCC)   Irritable bowel syndrome with  constipation   Congestive heart failure (CHF) (HCC)   Supplemental oxygen dependent   Hypertension   CHF (congestive heart failure) (HCC)   Diabetic renal disease ([place])   Pure hypercholesterolemia   Physical deconditioning   Chronic hypoxemic respiratory failure (HCC)   Pressure injury of back, stage 2 (HCC)  Left hip pain with migration of hardware Left intertrochanteric hip fracture with nonunion and fixation failure: Seen by orthopedics underwent removal of left cephalomedullary femoral nail, conversion of left THA, ORIF of greater trochanter fracture with trochanteric hook plate 02/12/2022.  Intraoperative cultures negative so far.  Continue PT OT pain control and DVT prophylaxis as per orthopedics.  Xray of her left hip stable post op changes. Awaiting SNF  Chronic hypoxic respiratory failure COPD/asthma: Continue 3l [place],continue bronchodilators.  Not in exacerbation  Acute postoperative anemia  Anemia of chronic kidney disease : Hh stable, cont iron and folate as outpatient monitor H&H  received 2 units PRBC so far. Recent Labs  Lab 02/12/22 1729 02/13/22 0351 02/14/22 0456 02/15/22 0357 02/18/22 0418  HGB 9.5* 6.7* 6.9* 8.1* 7.6*  HCT 28.0* 22.2* 21.1* 23.7* 23.7*   CKD 4 w/baseline creatinine of 2.5-3.2. After surgery in February, patient was on dialysis for 3 months.  Seen by nephrology, started Lasix continue to monitor intake output Daily weight BMP in avoid nephrotoxic medication. Recent Labs  Lab 02/14/22 0458 02/15/22 0357 02/16/22 0331 02/17/22 0404 02/18/22 0418  BUN 71* 69* 72* 71* 68*  CREATININE 2.98* 3.06* 3.22* 3.12* 3.29*     OSA not on CPAP Physical deconditioning/debility continue PT OT  Essential hypertension BP stable continue current Imdur Hyperlipidemia continue Crestor  Hypokalemia  repleted and improved Recent Labs  Lab 02/14/22 0458 02/15/22 0357 02/16/22 0331 02/17/22 0404 02/18/22 0418  K 3.7 3.4* 3.4* 3.2* 3.8     Gout: Cont  allopurinol  Chronic diastolic and systolic CHF EF 75-64% CAD: Currently euvolemic and compensated continue her statin hopefully resume aspirin Plavix if okay with surgery> I sent message to Dr. Zachery Dakins Net IO Since Admission: -7,879 mL [02/18/22 1018]   CHB: s/p  PPM   GERD:cont on sucralfate and Protonix. Hld: cont crestor. Constipation; continue bowel regimen. Klebsiella pneumonia UTI: completed Unasyn 02/16/2022. Class I Obesity:Patient's Body mass index is 33.48 kg/m. : Will benefit with PCP follow-up, weight loss  healthy lifestyle and outpatient sleep evaluation.  DVT prophylaxis: SCDs Start: 02/06/22 2257 Code Status:   Code Status: Full Code Family Communication: plan of care discussed with patient at bedside.  Patient status is: inpatient  because of pending SNF Level of care: Med-Surg  Dispo:The patient is from: home w/ sos how works            Anticipated disposition: SNF 1-2 days  Objective: Vitals last 24 hrs: Vitals:   02/17/22 1948 02/17/22 2026 02/18/22 0527 02/18/22 0911  BP:  (!) 100/47 (!) 125/49 (!) 122/58  Pulse:   88   Resp:  '16 18 17  '$ Temp:  98.1 F (36.7 C) 99.2 F (37.3 C) 98.3 F (36.8 C)  TempSrc:  Oral Oral Oral  SpO2: 98% 100% 100% 99%  Weight:      Height:       Weight change:   Physical Examination: General exam: AA, weak,older appearing HEENT:Oral mucosa moist, Ear/Nose WNL grossly, dentition normal. Respiratory system: bilaterally clear BS, no use of accessory muscle Cardiovascular system: S1 & S2 +, regular rate. Gastrointestinal system: Abdomen soft,NT,ND,BS+ Nervous System:Alert, awake, moving extremities and grossly nonfocal Extremities: LE ankle edema neg, left hip surgical site with Aquacel dressing in place Skin: No rashes,no icterus. PPI:RJJOAC muscle bulk,tone, power    Medications reviewed:  Scheduled Meds:  allopurinol  100 mg Oral Daily   aspirin EC  81 mg Oral BID   clopidogrel  75 mg Oral Daily   cycloSPORINE   1 drop Both Eyes Daily   ferrous sulfate  325 mg Oral Q breakfast   folic acid  1 mg Oral Daily   isosorbide mononitrate  90 mg Oral Daily   linaclotide  290 mcg Oral q morning   mometasone-formoterol  2 puff Inhalation BID   pantoprazole  40 mg Oral BID   polyethylene glycol  17 g Oral BID   rosuvastatin  20 mg Oral QPM   senna-docusate  1 tablet Oral BID   sucralfate  1 g Oral TID WC & HS   triamcinolone cream   Topical BID   Continuous Infusions: Diet Order             Diet heart healthy/carb modified Room service appropriate? Yes; Fluid consistency: Thin  Diet effective now                  Intake/Output Summary (Last 24 hours) at 02/18/2022 1018 Last data filed at 02/17/2022 1351 Gross per 24 hour  Intake --  Output 300 ml  Net -300 ml    Net IO Since Admission: -7,879 mL [02/18/22 1018]  Wt Readings from Last 3 Encounters:  02/12/22 85.7 kg  01/13/22 89.4 kg  11/05/21 98 kg     Unresulted Labs (From admission, onward)     Start  Ordered   02/18/22 0500  CBC  Daily,   R     Question:  Specimen collection method  Answer:  Lab=Lab collect   02/17/22 1104   02/12/22 0500  Renal function panel  Daily,   R      02/11/22 0841          Data Reviewed: I have personally reviewed following labs and imaging studies CBC: Recent Labs  Lab 02/12/22 0313 02/12/22 1540 02/12/22 1729 02/13/22 0351 02/14/22 0456 02/15/22 0357 02/18/22 0418  WBC 7.9  --   --  8.1 9.7 9.2 8.1  HGB 8.7*   < > 9.5* 6.7* 6.9* 8.1* 7.6*  HCT 29.7*   < > 28.0* 22.2* 21.1* 23.7* 23.7*  MCV 95.2  --   --  96.1 91.3 90.5 94.4  PLT 172  --   --  134* 136* 148* 193   < > = values in this interval not displayed.    Basic Metabolic Panel: Recent Labs  Lab 02/13/22 0351 02/13/22 0352 02/14/22 0458 02/15/22 0357 02/16/22 0331 02/17/22 0404 02/18/22 0418  NA 137   < > 137 136 136 135 136  K 4.2   < > 3.7 3.4* 3.4* 3.2* 3.8  CL 100   < > 99 100 99 99 99  CO2 25   < > '27 25 27 25  24  '$ GLUCOSE 164*   < > 129* 120* 121* 114* 109*  BUN 62*   < > 71* 69* 72* 71* 68*  CREATININE 2.71*   < > 2.98* 3.06* 3.22* 3.12* 3.29*  CALCIUM 8.7*   < > 8.4* 8.5* 8.6* 8.7* 9.1  MG 1.9  --  1.9 1.8  --   --   --   PHOS  --    < > 4.4 3.5 3.8 3.7 3.5   < > = values in this interval not displayed.    GFR: Estimated Creatinine Clearance: 14.9 mL/min (A) (by C-G formula based on SCr of 3.29 mg/dL (H)). Liver Function Tests: Recent Labs  Lab 02/14/22 0458 02/15/22 0357 02/16/22 0331 02/17/22 0404 02/18/22 0418  ALBUMIN 2.4* 2.2* 1.9* 1.8* 1.9*   CBG: Recent Labs  Lab 02/16/22 0806 02/16/22 1146 02/16/22 1643 02/16/22 2241 02/17/22 2031  GLUCAP 107* 145* 129* 171* 172*    Recent Results (from the past 240 hour(s))  Urine Culture     Status: Abnormal   Collection Time: 02/09/22 11:49 AM   Specimen: Urine, Clean Catch  Result Value Ref Range Status   Specimen Description URINE, CLEAN CATCH  Final   Special Requests   Final    NONE Performed at [facility], [place] [address] [place], [place] [address]    Culture >=100,000 COLONIES/mL KLEBSIELLA PNEUMONIAE (A)  Final   Report Status 02/11/2022 FINAL  Final   Organism ID, Bacteria KLEBSIELLA PNEUMONIAE (A)  Final      Susceptibility   Klebsiella pneumoniae - MIC*    AMPICILLIN RESISTANT Resistant     CEFAZOLIN <=4 SENSITIVE Sensitive     CEFEPIME <=0.12 SENSITIVE Sensitive     CEFTRIAXONE <=0.25 SENSITIVE Sensitive     CIPROFLOXACIN <=0.25 SENSITIVE Sensitive     GENTAMICIN <=1 SENSITIVE Sensitive     IMIPENEM <=0.25 SENSITIVE Sensitive     NITROFURANTOIN <=16 SENSITIVE Sensitive     TRIMETH/SULFA <=20 SENSITIVE Sensitive     AMPICILLIN/SULBACTAM <=2 SENSITIVE Sensitive     PIP/TAZO <=4 SENSITIVE Sensitive     * >=100,000 COLONIES/mL KLEBSIELLA  PNEUMONIAE  Surgical pcr screen     Status: None   Collection Time: 02/12/22 12:23 AM   Specimen: Nasal Mucosa; Nasal Swab  Result Value Ref Range Status   MRSA,  PCR NEGATIVE NEGATIVE Final   Staphylococcus aureus NEGATIVE NEGATIVE Final    Comment: (NOTE) The Xpert SA Assay (FDA approved for NASAL specimens in patients 33 years of age and older), is one component of a comprehensive surveillance program. It is not intended to diagnose infection nor to guide or monitor treatment. Performed at [facility], [place] [address], [place], [place] [address]   Aerobic/Anaerobic Culture w Gram Stain (surgical/deep wound)     Status: None   Collection Time: 02/12/22  1:51 PM   Specimen: Soft Tissue, Other  Result Value Ref Range Status   Specimen Description TISSUE HIP LEFT  Final   Special Requests A  Final   Gram Stain   Final    RARE WBC PRESENT, PREDOMINANTLY MONONUCLEAR NO ORGANISMS SEEN CRITICAL RESULT CALLED TO, READ BACK BY AND VERIFIED WITH: RN Julieta Gutting [id number] AT 1437 BY CM    Culture   Final    No growth aerobically or anaerobically. Performed at [facility], [place] [address], [place], [place] [address]    Report Status 02/17/2022 FINAL  Final  Aerobic/Anaerobic Culture w Gram Stain (surgical/deep wound)     Status: None   Collection Time: 02/12/22  2:08 PM   Specimen: Soft Tissue, Other  Result Value Ref Range Status   Specimen Description TISSUE LEFT FEMUR CANAL  Final   Special Requests B  Final   Gram Stain   Final    RARE WBC PRESENT,BOTH PMN AND MONONUCLEAR NO ORGANISMS SEEN Gram Stain Report Called to,Read Back By and Verified With: DR.MARSHONIE [id number] '@1510'$  FH    Culture   Final    No growth aerobically or anaerobically. Performed at [facility], [place] [address], [place], [place] [address]    Report Status 02/17/2022 FINAL  Final  Aerobic/Anaerobic Culture w Gram Stain (surgical/deep wound)     Status: None   Collection Time: 02/12/22  2:39 PM   Specimen: Soft Tissue, Other  Result Value Ref Range Status   Specimen Description TISSUE LEFT HIP  Final   Special Requests SAMPLE NO 1 PT ON VANC, UNASYN  Final    Gram Stain NO WBC SEEN NO ORGANISMS SEEN   Final   Culture   Final    No growth aerobically or anaerobically. Performed at [facility], [place] [address] [place], [place] [address]    Report Status 02/17/2022 FINAL  Final  Aerobic/Anaerobic Culture w Gram Stain (surgical/deep wound)     Status: None   Collection Time: 02/12/22  2:44 PM   Specimen: Soft Tissue, Other  Result Value Ref Range Status   Specimen Description TISSUE LEFT HIP  Final   Special Requests SAMPLE NO 2 PT ON VANC, UNASYN  Final   Gram Stain NO WBC SEEN NO ORGANISMS SEEN   Final   Culture   Final    No growth aerobically or anaerobically. Performed at [facility], [place] [address], [place], [place] [address]    Report Status 02/17/2022 FINAL  Final  Aerobic/Anaerobic Culture w Gram Stain (surgical/deep wound)     Status: None   Collection Time: 02/12/22  2:44 PM   Specimen: Soft Tissue, Other  Result Value Ref Range Status   Specimen Description TISSUE LEFT HIP  Final   Special Requests SAMPLE NO 3 PT ON VANC , UNASYN  Final   Gram Stain NO WBC SEEN NO ORGANISMS SEEN   Final   Culture   Final    No growth aerobically or anaerobically. Performed at [facility], [place] [address], [place], [place] [address]    Report Status 02/17/2022 FINAL  Final    Antimicrobials: Anti-infectives (From admission, onward)    Start     Dose/Rate Route Frequency Ordered Stop   02/12/22 0600  ceFAZolin (ANCEF) IVPB 2g/100 mL premix  Status:  Discontinued        2 g 200 mL/hr over 30 Minutes Intravenous On call to O.R. 02/11/22 2328 02/11/22 2335   02/12/22 0600  vancomycin (VANCOCIN) IVPB 1000 mg/200 mL premix        1,000 mg 200 mL/hr over 60 Minutes Intravenous On call to O.R. 02/11/22 2328 02/12/22 1239   02/09/22 1130  Ampicillin-Sulbactam (UNASYN) 3 g in sodium chloride 0.9 % 100 mL IVPB        3 g 200 mL/hr over 30 Minutes Intravenous Every 12 hours 02/09/22 1031 02/17/22 0535       Culture/Microbiology    Component Value Date/Time   SDES TISSUE LEFT HIP 02/12/2022 1444   SDES TISSUE LEFT HIP 02/12/2022 1444   SPECREQUEST SAMPLE NO 2 PT ON VANC, UNASYN 02/12/2022 1444   SPECREQUEST SAMPLE NO 3 PT ON VANC , UNASYN 02/12/2022 1444   CULT  02/12/2022 1444    No growth aerobically or anaerobically. Performed at [facility], [place] [address], [place], [place] [address]    CULT  02/12/2022 1444    No growth aerobically or anaerobically. Performed at [facility], [place] [address] [place], [place] [address]    REPTSTATUS 02/17/2022 FINAL 02/12/2022 1444   REPTSTATUS 02/17/2022 FINAL 02/12/2022 1444  Radiology Studies: DG HIP UNILAT WITH PELVIS 2-3 VIEWS LEFT  Result Date: 02/17/2022 CLINICAL DATA:  [id number] Pain [id number] EXAM: DG HIP (WITH OR WITHOUT PELVIS) 2-3V LEFT COMPARISON:  X-ray pelvis 02/12/2022, CT pelvis 02/06/2022, x-ray pelvis 01/26/2022 FINDINGS: Limited evaluation due to overlapping osseous structures and overlying soft tissues. Total right hip arthroplasty with associated plate and screw fixation and cerclage wires of the proximal femur. No radiographic findings suggest surgical hardware complication. Right hip demonstrates no acute displaced fracture or dislocation on frontal view. No acute displaced fracture or diastasis of the bones of the pelvis. Lucency overlying the right inferior pubic rami acute likely due to overlapping bowel. At least mild degenerative changes of the right hip. Vascular calcifications. IMPRESSION: 1. Negative for acute traumatic injury. 2. Total right hip arthroplasty with associated plate and screw fixation and cerclage wires of the proximal femur. No radiographic findings suggest surgical hardware complication. Electronically Signed   By: Iven Finn M.D.   On: 02/17/2022 23:25     LOS: 12 days   Antonieta Pert, MD [facility]  02/18/2022, 10:18 AM

## 2022-02-18 NOTE — Progress Notes (Signed)
     Subjective: Patient reports bad experience yesterday trying to use hoyer lift for transfer. Would prefer to use walker for transfers but limited by weakness and pain. Encouraged her to continue to work on mobility and strengthening as tolerated. New xrays reviewed demonstrate stable position of her hip implant. Denies distal n/t.  Objective:   VITALS:   Vitals:   02/17/22 1350 02/17/22 1948 02/17/22 2026 02/18/22 0527  BP: (!) 115/57  (!) 100/47 (!) 125/49  Pulse:    88  Resp: '18  16 18  '$ Temp: 99.3 F (37.4 C)  98.1 F (36.7 C) 99.2 F (37.3 C)  TempSrc: Oral  Oral Oral  SpO2: 100% 98% 100% 100%  Weight:      Height:        Sensation intact distally Intact pulses distally Dorsiflexion/Plantar flexion intact Incision: dressing C/D/I Compartment soft   Lab Results  Component Value Date   WBC 8.1 02/18/2022   HGB 7.6 (L) 02/18/2022   HCT 23.7 (L) 02/18/2022   MCV 94.4 02/18/2022   PLT 193 02/18/2022   BMET    Component Value Date/Time   NA 136 02/18/2022 0418   NA 136 09/02/2021 1221   K 3.8 02/18/2022 0418   CL 99 02/18/2022 0418   CO2 24 02/18/2022 0418   GLUCOSE 109 (H) 02/18/2022 0418   BUN 68 (H) 02/18/2022 0418   BUN 85 (HH) 09/02/2021 1221   CREATININE 3.29 (H) 02/18/2022 0418   CREATININE 1.05 04/04/2014 1014   CALCIUM 9.1 02/18/2022 0418   CALCIUM 9.3 04/25/2021 0626   EGFR 11 (L) 09/02/2021 1221   GFRNONAA 14 (L) 02/18/2022 0418      Xray: THA revision components in good position, no adverse features.  Assessment/Plan: 6 Days Post-Op   Principal Problem:   Pain from implanted hardware Active Problems:   HTN (hypertension), benign   Gastritis without bleeding   Cardiomyopathy (HCC)   OSA (obstructive sleep apnea)   Mixed hypercholesterolemia and hypertriglyceridemia   CAD S/P percutaneous coronary angioplasty   Chronic diastolic CHF (congestive heart failure) (HCC)   CHB (complete heart block) (HCC)   Atrial fibrillation (HCC)    ESRD (end stage renal disease) ([place])   Diabetes mellitus without complication (HCC)   Thrombocytopenia (HCC)   Irritable bowel syndrome with constipation   Congestive heart failure (CHF) (HCC)   Supplemental oxygen dependent   Hypertension   CHF (congestive heart failure) (HCC)   Diabetic renal disease ([place])   Pure hypercholesterolemia   Physical deconditioning   Chronic hypoxemic respiratory failure (HCC)   Pressure injury of back, stage 2 (HCC)  S/p L hip hardware removal conversion to THA and ORIF greater trochanter fracture 02/12/22   Post op recs: WB: 50% WBAT LLE, posterior hip precautions x 6 weeks, no active abduction. Abx: Vancomycin + Unasyn continue abx pending intra-op cxs Imaging: PACU pelvis Xray Dressing: Aquacell, keep intact until follow up DVT prophylaxis: lovenox switched to aspirin '81mg'$  BID and plavix given persistent anemia Follow up: 2 weeks after surgery for a wound check with Dr. Zachery Dakins at [facility].  Address: [address] [place], [place], [place] [address]  Office Phone: [phone]    Willaim Sheng 02/18/2022, 8:03 AM   Charlies Constable, MD  Contact information:   [phone] 7am-5pm epic message Dr. Zachery Dakins, or call office for patient follow up: [phone] After hours and holidays please check [email or web address] for group call information for [facility]

## 2022-02-18 NOTE — Progress Notes (Signed)
Occupational Therapy Treatment Patient Details Name: [name] MRN: [id number] DOB: 1945-07-11 Today's Date: 02/18/2022   History of present illness Pt is a 77 y/o F admitted on 02/06/22. Pt with recent hx of L hip & L superior pubic ramus fx & had f/u scan showing atrophic non union & pt started developing R hip pain & inability to walk. Pt was admitted & imaging showed cephalomedullary screw migration of the intertrochanteric nailing s/p fall 2 days prior to admission. Pt underwent removal of left cephalomedullary femoral nail, conversion of left total hip arthroplasty, ORIF of greater trochanter fracture with trochanteric hook plate on 4/0/9811. PMH: CAD s/p CABG, HFpEF, obesity, adrenal insufficiency, DM, HTN, CKD3, OSA/obesity-hypoventilation syndrome on 3L O2 chronically, complete heart block s/p PPM/ICD   OT comments  Pt in bed upon therapy arrival and agreeable to participate in OT treatment session. Session focused on bed mobility, sitting/standing balance, and sit to stand transitions. Pt educated on hip precautions as needed throughout session. With assistance was able to transition to sitting EOB. First sit to stand attempt was unsuccessful. OT provided pt additional education on flexing forward at trunk while pushing up with hands from bed. Pt requested to use both hands to push up from bed. 2nd attempt to stand was successful requiring only Mod A to achieve. Pt assisted with lateral scoots towards HOB prior to returning supine. Acute OT will continue to follow.    Recommendations for follow up therapy are one component of a multi-disciplinary discharge planning process, led by the attending physician.  Recommendations may be updated based on patient status, additional functional criteria and insurance authorization.    Follow Up Recommendations  Skilled nursing-short term rehab (<3 hours/day)     Assistance Recommended at Discharge Intermittent Supervision/Assistance  Patient can  return home with the following  A lot of help with walking and/or transfers;A lot of help with bathing/dressing/bathroom;Help with stairs or ramp for entrance;Assist for transportation;Assistance with cooking/housework   Equipment Recommendations  None recommended by OT       Precautions / Restrictions Precautions Precautions: Fall;Posterior Hip Precaution Comments: LLE no active hip abduction Restrictions Weight Bearing Restrictions: Yes LLE Weight Bearing: Partial weight bearing LLE Partial Weight Bearing Percentage or Pounds: 50       Mobility Bed Mobility Overal bed mobility: Needs Assistance Bed Mobility: Supine to Sit, Sit to Supine     Supine to sit: Max assist, HOB elevated Sit to supine: Mod assist   General bed mobility comments: Pt able to move RLE and rotate trunk to the right to reach for bed railing. Therapist off loaded LLE to allow patient to complete active assist adduction while moving leg towards EOB. Assist provided to bring trunk up off HOB in order to sit. Patient Response: Cooperative  Transfers Overall transfer level: Needs assistance Equipment used: Rolling walker (2 wheels) Transfers: Sit to/from Stand Sit to Stand: Mod assist, From elevated surface           General transfer comment: Pt completed two attempts of Sit to Stand. First attempt unsuccessful placing 1 hand on RW and 1 hand on bed to push off. Second attempt, pt used bilateral arms to push up from the bed then placed hands on RW when standing. Pt able to tolerate ~ 45" standing at EOB.     Balance Overall balance assessment: Needs assistance Sitting-balance support: No upper extremity supported, Feet supported Sitting balance-Leahy Scale: Good Sitting balance - Comments: sitting EOB   Standing balance support: Bilateral  upper extremity supported, During functional activity, Reliant on assistive device for balance Standing balance-Leahy Scale: Poor Standing balance comment: standing  at EOB     ADL either performed or assessed with clinical judgement   ADL Overall ADL's : Needs assistance/impaired          Cognition Arousal/Alertness: Awake/alert Behavior During Therapy: WFL for tasks assessed/performed Overall Cognitive Status: Within Functional Limits for tasks assessed                     Pertinent Vitals/ Pain       Pain Assessment Pain Assessment: 0-10 Pain Score: 6  Pain Location: L hip Pain Descriptors / Indicators: Discomfort, Guarding, Aching, Grimacing Pain Intervention(s): Limited activity within patient's tolerance, Monitored during session, Patient requesting pain meds-RN notified         Frequency  Min 2X/week        Progress Toward Goals  OT Goals(current goals can now be found in the care plan section)  Progress towards OT goals: Progressing toward goals     Plan Discharge plan remains appropriate;Frequency remains appropriate       AM-PAC OT "6 Clicks" Daily Activity     Outcome Measure   Help from another person eating meals?: None Help from another person taking care of personal grooming?: None Help from another person toileting, which includes using toliet, bedpan, or urinal?: Total Help from another person bathing (including washing, rinsing, drying)?: A Lot Help from another person to put on and taking off regular upper body clothing?: A Little Help from another person to put on and taking off regular lower body clothing?: Total 6 Click Score: 15    End of Session Equipment Utilized During Treatment: Gait belt;Rolling walker (2 wheels)  OT Visit Diagnosis: Unsteadiness on feet (R26.81);Muscle weakness (generalized) (M62.81)   Activity Tolerance Patient tolerated treatment well   Patient Left in bed;with call bell/phone within reach;with bed alarm set   Nurse Communication Mobility status;Patient requests pain meds        Time: 7867-5449 OT Time Calculation (min): 46 min  Charges: OT General  Charges $OT Visit: 1 Visit OT Treatments $Therapeutic Activity: 38-52 mins  Ailene Ravel, OTR/L,CBIS  Supplemental OT - [facility] and [facility] Secure Chat Preferred    Nari Vannatter, Clarene Duke 02/18/2022, 4:01 PM

## 2022-02-19 ENCOUNTER — Ambulatory Visit: Payer: Medicare Other | Admitting: Podiatry

## 2022-02-19 DIAGNOSIS — R0600 Dyspnea, unspecified: Secondary | ICD-10-CM | POA: Diagnosis not present

## 2022-02-19 DIAGNOSIS — R1312 Dysphagia, oropharyngeal phase: Secondary | ICD-10-CM | POA: Diagnosis not present

## 2022-02-19 DIAGNOSIS — R0602 Shortness of breath: Secondary | ICD-10-CM | POA: Diagnosis not present

## 2022-02-19 DIAGNOSIS — I129 Hypertensive chronic kidney disease with stage 1 through stage 4 chronic kidney disease, or unspecified chronic kidney disease: Secondary | ICD-10-CM | POA: Diagnosis not present

## 2022-02-19 DIAGNOSIS — R6889 Other general symptoms and signs: Secondary | ICD-10-CM | POA: Diagnosis not present

## 2022-02-19 DIAGNOSIS — K209 Esophagitis, unspecified without bleeding: Secondary | ICD-10-CM | POA: Diagnosis not present

## 2022-02-19 DIAGNOSIS — E119 Type 2 diabetes mellitus without complications: Secondary | ICD-10-CM | POA: Diagnosis not present

## 2022-02-19 DIAGNOSIS — Z23 Encounter for immunization: Secondary | ICD-10-CM | POA: Diagnosis not present

## 2022-02-19 DIAGNOSIS — E876 Hypokalemia: Secondary | ICD-10-CM | POA: Diagnosis not present

## 2022-02-19 DIAGNOSIS — N1832 Chronic kidney disease, stage 3b: Secondary | ICD-10-CM | POA: Diagnosis not present

## 2022-02-19 DIAGNOSIS — Z7982 Long term (current) use of aspirin: Secondary | ICD-10-CM | POA: Diagnosis not present

## 2022-02-19 DIAGNOSIS — I10 Essential (primary) hypertension: Secondary | ICD-10-CM | POA: Diagnosis not present

## 2022-02-19 DIAGNOSIS — W19XXXA Unspecified fall, initial encounter: Secondary | ICD-10-CM | POA: Diagnosis not present

## 2022-02-19 DIAGNOSIS — R262 Difficulty in walking, not elsewhere classified: Secondary | ICD-10-CM | POA: Diagnosis not present

## 2022-02-19 DIAGNOSIS — Z741 Need for assistance with personal care: Secondary | ICD-10-CM | POA: Diagnosis not present

## 2022-02-19 DIAGNOSIS — E1122 Type 2 diabetes mellitus with diabetic chronic kidney disease: Secondary | ICD-10-CM | POA: Diagnosis not present

## 2022-02-19 DIAGNOSIS — D649 Anemia, unspecified: Secondary | ICD-10-CM | POA: Diagnosis not present

## 2022-02-19 DIAGNOSIS — I2510 Atherosclerotic heart disease of native coronary artery without angina pectoris: Secondary | ICD-10-CM | POA: Diagnosis not present

## 2022-02-19 DIAGNOSIS — T85848A Pain due to other internal prosthetic devices, implants and grafts, initial encounter: Secondary | ICD-10-CM | POA: Diagnosis not present

## 2022-02-19 DIAGNOSIS — J9612 Chronic respiratory failure with hypercapnia: Secondary | ICD-10-CM | POA: Diagnosis not present

## 2022-02-19 DIAGNOSIS — S72112D Displaced fracture of greater trochanter of left femur, subsequent encounter for closed fracture with routine healing: Secondary | ICD-10-CM | POA: Diagnosis not present

## 2022-02-19 DIAGNOSIS — N185 Chronic kidney disease, stage 5: Secondary | ICD-10-CM | POA: Diagnosis not present

## 2022-02-19 DIAGNOSIS — Z7951 Long term (current) use of inhaled steroids: Secondary | ICD-10-CM | POA: Diagnosis not present

## 2022-02-19 DIAGNOSIS — N2581 Secondary hyperparathyroidism of renal origin: Secondary | ICD-10-CM | POA: Diagnosis not present

## 2022-02-19 DIAGNOSIS — E785 Hyperlipidemia, unspecified: Secondary | ICD-10-CM | POA: Diagnosis not present

## 2022-02-19 DIAGNOSIS — R042 Hemoptysis: Secondary | ICD-10-CM | POA: Diagnosis not present

## 2022-02-19 DIAGNOSIS — M84359A Stress fracture, hip, unspecified, initial encounter for fracture: Secondary | ICD-10-CM | POA: Diagnosis not present

## 2022-02-19 DIAGNOSIS — M6281 Muscle weakness (generalized): Secondary | ICD-10-CM | POA: Diagnosis not present

## 2022-02-19 DIAGNOSIS — Z4789 Encounter for other orthopedic aftercare: Secondary | ICD-10-CM | POA: Diagnosis not present

## 2022-02-19 DIAGNOSIS — Z79899 Other long term (current) drug therapy: Secondary | ICD-10-CM | POA: Diagnosis not present

## 2022-02-19 DIAGNOSIS — G4489 Other headache syndrome: Secondary | ICD-10-CM | POA: Diagnosis not present

## 2022-02-19 DIAGNOSIS — N289 Disorder of kidney and ureter, unspecified: Secondary | ICD-10-CM | POA: Diagnosis not present

## 2022-02-19 DIAGNOSIS — Y792 Prosthetic and other implants, materials and accessory orthopedic devices associated with adverse incidents: Secondary | ICD-10-CM | POA: Diagnosis not present

## 2022-02-19 DIAGNOSIS — D631 Anemia in chronic kidney disease: Secondary | ICD-10-CM | POA: Diagnosis not present

## 2022-02-19 DIAGNOSIS — J45909 Unspecified asthma, uncomplicated: Secondary | ICD-10-CM | POA: Diagnosis not present

## 2022-02-19 DIAGNOSIS — R2681 Unsteadiness on feet: Secondary | ICD-10-CM | POA: Diagnosis not present

## 2022-02-19 DIAGNOSIS — I5022 Chronic systolic (congestive) heart failure: Secondary | ICD-10-CM | POA: Diagnosis not present

## 2022-02-19 DIAGNOSIS — Z950 Presence of cardiac pacemaker: Secondary | ICD-10-CM | POA: Diagnosis not present

## 2022-02-19 DIAGNOSIS — Z743 Need for continuous supervision: Secondary | ICD-10-CM | POA: Diagnosis not present

## 2022-02-19 DIAGNOSIS — I110 Hypertensive heart disease with heart failure: Secondary | ICD-10-CM | POA: Diagnosis not present

## 2022-02-19 DIAGNOSIS — W19XXXD Unspecified fall, subsequent encounter: Secondary | ICD-10-CM | POA: Diagnosis not present

## 2022-02-19 DIAGNOSIS — Z7401 Bed confinement status: Secondary | ICD-10-CM | POA: Diagnosis not present

## 2022-02-19 DIAGNOSIS — J449 Chronic obstructive pulmonary disease, unspecified: Secondary | ICD-10-CM | POA: Diagnosis not present

## 2022-02-19 DIAGNOSIS — R040 Epistaxis: Secondary | ICD-10-CM | POA: Diagnosis not present

## 2022-02-19 DIAGNOSIS — J9621 Acute and chronic respiratory failure with hypoxia: Secondary | ICD-10-CM | POA: Diagnosis not present

## 2022-02-19 DIAGNOSIS — N184 Chronic kidney disease, stage 4 (severe): Secondary | ICD-10-CM | POA: Diagnosis not present

## 2022-02-19 DIAGNOSIS — Z7902 Long term (current) use of antithrombotics/antiplatelets: Secondary | ICD-10-CM | POA: Diagnosis not present

## 2022-02-19 DIAGNOSIS — M7989 Other specified soft tissue disorders: Secondary | ICD-10-CM | POA: Diagnosis not present

## 2022-02-19 DIAGNOSIS — I5033 Acute on chronic diastolic (congestive) heart failure: Secondary | ICD-10-CM | POA: Diagnosis not present

## 2022-02-19 DIAGNOSIS — R531 Weakness: Secondary | ICD-10-CM | POA: Diagnosis not present

## 2022-02-19 DIAGNOSIS — I509 Heart failure, unspecified: Secondary | ICD-10-CM | POA: Diagnosis not present

## 2022-02-19 DIAGNOSIS — T85848S Pain due to other internal prosthetic devices, implants and grafts, sequela: Secondary | ICD-10-CM | POA: Diagnosis not present

## 2022-02-19 DIAGNOSIS — M19072 Primary osteoarthritis, left ankle and foot: Secondary | ICD-10-CM | POA: Diagnosis not present

## 2022-02-19 DIAGNOSIS — J9611 Chronic respiratory failure with hypoxia: Secondary | ICD-10-CM | POA: Diagnosis not present

## 2022-02-19 LAB — GLUCOSE, CAPILLARY
Glucose-Capillary: 133 mg/dL — ABNORMAL HIGH (ref 70–99)
Glucose-Capillary: 122 mg/dL — ABNORMAL HIGH (ref 70–99)
Glucose-Capillary: 136 mg/dL — ABNORMAL HIGH (ref 70–99)

## 2022-02-19 LAB — CBC
HCT: 23.9 % — ABNORMAL LOW (ref 36.0–46.0)
Hemoglobin: 7.5 g/dL — ABNORMAL LOW (ref 12.0–15.0)
MCH: 29.8 pg (ref 26.0–34.0)
MCHC: 31.4 g/dL (ref 30.0–36.0)
MCV: 94.8 fL (ref 80.0–100.0)
Platelets: 211 10*3/uL (ref 150–400)
RBC: 2.52 MIL/uL — ABNORMAL LOW (ref 3.87–5.11)
RDW: 14.9 % (ref 11.5–15.5)
WBC: 8.2 10*3/uL (ref 4.0–10.5)
nRBC: 0 % (ref 0.0–0.2)

## 2022-02-19 LAB — RENAL FUNCTION PANEL
Albumin: 1.9 g/dL — ABNORMAL LOW (ref 3.5–5.0)
Anion gap: 12 (ref 5–15)
BUN: 67 mg/dL — ABNORMAL HIGH (ref 8–23)
CO2: 26 mmol/L (ref 22–32)
Calcium: 9.3 mg/dL (ref 8.9–10.3)
Chloride: 97 mmol/L — ABNORMAL LOW (ref 98–111)
Creatinine, Ser: 3.19 mg/dL — ABNORMAL HIGH (ref 0.44–1.00)
GFR, Estimated: 14 mL/min — ABNORMAL LOW (ref >60)
Glucose, Bld: 135 mg/dL — ABNORMAL HIGH (ref 70–99)
Phosphorus: 3.9 mg/dL (ref 2.5–4.6)
Potassium: 3.8 mmol/L (ref 3.5–5.1)
Sodium: 135 mmol/L (ref 135–145)

## 2022-02-19 MED ORDER — FOLIC ACID 1 MG PO TABS: 1.0000 mg | ORAL_TABLET | Freq: Every day | ORAL | 0 refills | Status: DC

## 2022-02-19 MED ORDER — SUCRALFATE 1 G PO TABS: 1.0000 g | ORAL_TABLET | Freq: Three times a day (TID) | ORAL | Status: DC

## 2022-02-19 MED ORDER — ENSURE ENLIVE PO LIQD
237.0000 mL | Freq: Two times a day (BID) | ORAL | Status: DC
  Administered 2022-02-19: 237 mL via ORAL

## 2022-02-19 MED ORDER — OXYCODONE HCL 5 MG PO TABS: 5.0000 mg | ORAL_TABLET | ORAL | 0 refills | Status: AC | PRN

## 2022-02-19 MED ORDER — FUROSEMIDE 80 MG PO TABS: 80.0000 mg | ORAL_TABLET | ORAL | 0 refills | Status: DC | PRN

## 2022-02-19 MED ORDER — METHOCARBAMOL 500 MG PO TABS: 500.0000 mg | ORAL_TABLET | Freq: Four times a day (QID) | ORAL | Status: AC | PRN

## 2022-02-19 MED ORDER — FERROUS SULFATE 325 (65 FE) MG PO TABS: 325.0000 mg | ORAL_TABLET | Freq: Every day | ORAL | 0 refills | Status: AC

## 2022-02-19 NOTE — Discharge Summary (Addendum)
Physician Discharge Summary  [name] HER:[id number] DOB: [birth year] DOA: 02/06/2022  PCP: Lujean Amel, MD  Admit date: 02/06/2022 Discharge date: 02/19/2022 Recommendations for Outpatient Follow-up:  Follow up with PCP in 1 weeks-call for appointment Please obtain BMP/CBC in one week  Discharge Dispo: SNF Discharge Condition: Stable Code Status:   Code Status: Full Code Diet recommendation:  Diet Order             DIET DYS 3 Room service appropriate? Yes; Fluid consistency: Thin  Diet effective now                    Brief/Interim Summary: 77 years old female with past medical history of CAD status post CABG, HFpEF, Obesity, adrenal insufficiency, Diabetes mellitus, HTN, CKD stage IV,  OSA/obesity-hypoventilation syndrome on 3 L O2 chronically, complete heart block s/p PPM/ICD and recent history of left hip and left superior pubic ramus fracture who had a follow-up scan showed atrophic none union.  After Christmas patient started developing right hip pain and could not walk so was admitted hospital for  total hip arthroplasty. Imaging showed cephalomedullary screw  migration of the intertrochanteric nailing status post fall 2 days prior to admission.  Patient was seen by orthopedics and underwent surgical intervention 02/12/2022.  Nephrology was also consulted for previous history of dialysis.  Postoperatively, patient required 2 units of packed RBC.  Seen by PT OT.  At this time patient remains medically stable afebrile.  Recommending skilled nursing facility.   After discussion with orthopedics patient was placed back on aspirin and Plavix Lovenox discontinued due to ongoing anemia. Seems medically stable discharge to skilled nursing facility.  OR culture has been negative from 02/12/22  Discharge Diagnoses:  Principal Problem:   Pain from implanted hardware Active Problems:   Cardiomyopathy ([place])   OSA (obstructive sleep apnea)   Thrombocytopenia (HCC)   HTN  (hypertension), benign   Gastritis without bleeding   Mixed hypercholesterolemia and hypertriglyceridemia   CAD S/P percutaneous coronary angioplasty   Chronic diastolic CHF (congestive heart failure) (HCC)   CHB (complete heart block) (HCC)   Atrial fibrillation (HCC)   ESRD (end stage renal disease) ([place])   Diabetes mellitus without complication (HCC)   Irritable bowel syndrome with constipation   Congestive heart failure (CHF) (HCC)   Supplemental oxygen dependent   Hypertension   CHF (congestive heart failure) (HCC)   Diabetic renal disease ([place])   Pure hypercholesterolemia   Physical deconditioning   Chronic hypoxemic respiratory failure (HCC)   Pressure injury of back, stage 2 (HCC)  Left hip pain with migration of hardware Left intertrochanteric hip fracture with nonunion and fixation failure: Seen by orthopedics underwent removal of left cephalomedullary femoral nail, conversion of left THA, ORIF of greater trochanter fracture with trochanteric hook plate 02/12/2022.  Intraoperative cultures negative so far.  Some recent antibiotic until 1/8.  Continue PT OT, remains medically stable for discharge to skilled nursing facility.Xray of her left hip 1/8 night w/ stable post op changes. Discussed w/ Ortho and okay for dc to SNF today.   Chronic hypoxic respiratory failure COPD/asthma: Continue 3l [place],continue bronchodilators.  Not in exacerbation   Acute postoperative anemia  Anemia of chronic kidney disease : Hh stable, cont iron and folate as outpatient monitor H&H.received 2 units PRBC so far.  Monitor H&H closely as outpatient transfuse if less than 7 g. Recent Labs  Lab 02/13/22 0351 02/14/22 0456 02/15/22 0357 02/18/22 0418 02/19/22 0334  HGB 6.7* 6.9* 8.1*  7.6* 7.5*  HCT 22.2* 21.1* 23.7* 23.7* 23.9*    CKD 4 w/baseline creatinine of 2.5-3.2. After surgery in February, patient was on dialysis for 3 months.  Seen by nephrology, initially received Lasix subsequently  discontinued and can continue as needed for full attention and swelling per nephrology .  Follow-up with Dr. Moshe Cipro in 1 to 2 weeks Recent Labs  Lab 02/15/22 0357 02/16/22 0331 02/17/22 0404 02/18/22 0418 02/19/22 0334  BUN 69* 72* 71* 68* 67*  CREATININE 3.06* 3.22* 3.12* 3.29* 3.19*    OSA not on CPAP Physical deconditioning/debility continue PT OT  Essential hypertension BP stable continue current Imdur Hyperlipidemia continue Crestor  Hypokalemia repleted and improved  Gout: Cont allopurinol  Chronic diastolic and systolic CHF EF 09-60% CAD: Currently euvolemic and compensated continue her statin .aspirin Plavix was resumed after discussion with orthopedic.  Net IO Since Admission: -7,519 mL [02/19/22 1105]    CHB: s/p  PPM   GERD:cont on sucralfate and Protonix. Hld: cont crestor. Constipation; continue bowel regimen. Klebsiella pneumonia UTI: completed Unasyn 02/16/2022. Class I Obesity:Patient's Body mass index is 33.48 kg/m. : Will benefit with PCP follow-up, weight loss  healthy lifestyle and outpatient sleep evaluation.  Consults: Orthopedics  Subjective: Alert awake oriented resting comfortably.  No complaints  Discharge Exam: Vitals:   02/19/22 0839 02/19/22 0844  BP: (!) 118/54   Pulse: 82   Resp: 16   Temp: 98.2 F (36.8 C)   SpO2: 100% 100%   General: Pt is alert, awake, not in acute distress Cardiovascular: RRR, S1/S2 +, no rubs, no gallops Respiratory: CTA bilaterally, no wheezing, no rhonchi Abdominal: Soft, NT, ND, bowel sounds + Extremities: no edema, no cyanosis  Discharge Instructions  Discharge Instructions     Discharge instructions   Complete by: As directed    Check CBC BMP in 5 to 7 days at the rehab  Please call call MD or return to ER for similar or worsening recurring problem that brought you to hospital or if any fever,nausea/vomiting,abdominal pain, uncontrolled pain, chest pain,  shortness of breath or any other  alarming symptoms.  Please follow-up your doctor as instructed in a week time and call the office for appointment.  Please avoid alcohol, smoking, or any other illicit substance and maintain healthy habits including taking your regular medications as prescribed.  You were cared for by a hospitalist during your hospital stay. If you have any questions about your discharge medications or the care you received while you were in the hospital after you are discharged, you can call the unit and ask to speak with the hospitalist on call if the hospitalist that took care of you is not available.  Once you are discharged, your primary care physician will handle any further medical issues. Please note that NO REFILLS for any discharge medications will be authorized once you are discharged, as it is imperative that you return to your primary care physician (or establish a relationship with a primary care physician if you do not have one) for your aftercare needs so that they can reassess your need for medications and monitor your lab values   Increase activity slowly   Complete by: As directed       Allergies as of 02/19/2022       Reactions   Ivp Dye [iodinated Contrast Media] Shortness Of Breath   No reaction to PO contrast with non-ionic dye.06-25-2014/rsm   Shellfish Allergy Anaphylaxis   Sulfa Antibiotics Shortness Of Breath   Iodine  Hives   Atorvastatin Other (See Comments)   Pt states "causes bilateral leg pain/cramps."   Benadryl [diphenhydramine] Other (See Comments)   "THIS DRIVES ME CRAZY AND MAKES ME FEEL LIKE I AM DYING"   Colchicine Nausea And Vomiting   Doxycycline Other (See Comments)   Unknown reaction, per patient   Uloric [febuxostat] Other (See Comments)   Unknown reaction   Cephalexin Itching, Rash   Zithromax [azithromycin] Rash        Medication List     STOP taking these medications    metolazone 2.5 MG tablet Commonly known as: ZAROXOLYN   Potassium Chloride  ER 20 MEQ Tbcr       TAKE these medications    Accu-Chek Aviva Plus test strip Generic drug: glucose blood Check blood sugar twice daily   Accu-Chek Guide w/Device Kit   Accu-Chek Softclix Lancets lancets Check blood sugar twice daily   acetaminophen 500 MG tablet Commonly known as: TYLENOL Take 1,000 mg by mouth 3 (three) times daily as needed (for pain or headaches).   albuterol 108 (90 Base) MCG/ACT inhaler Commonly known as: VENTOLIN HFA Inhale 2 puffs into the lungs every 6 (six) hours as needed for wheezing or shortness of breath.   albuterol (2.5 MG/3ML) 0.083% nebulizer solution Commonly known as: PROVENTIL Take 3 mLs (2.5 mg total) by nebulization every 6 (six) hours as needed for wheezing or shortness of breath.   allopurinol 100 MG tablet Commonly known as: ZYLOPRIM Take 1 tablet (100 mg total) by mouth daily. Please contact the office to schedule appointment for additional refills. 1st Attempt.   aspirin EC 81 MG tablet Take 1 tablet (81 mg total) by mouth daily.   B-D SINGLE USE SWABS REGULAR Pads   budesonide-formoterol 160-4.5 MCG/ACT inhaler Commonly known as: SYMBICORT Inhale 1 puff into the lungs 2 (two) times daily.   carboxymethylcellulose 0.5 % Soln Commonly known as: REFRESH PLUS Place 1 drop into both eyes 3 (three) times daily as needed (dry eyes).   clopidogrel 75 MG tablet Commonly known as: PLAVIX Take 1 tablet (75 mg total) by mouth daily.   cyanocobalamin 1000 MCG tablet Commonly known as: VITAMIN B12 Take 1 tablet (1,000 mcg total) by mouth daily.   cycloSPORINE 0.05 % ophthalmic emulsion Commonly known as: RESTASIS Place 1 drop into both eyes 2 (two) times daily.   EPINEPHrine 0.3 mg/0.3 mL Soaj injection Commonly known as: EPI-PEN Inject 0.3 mg into the muscle once as needed for anaphylaxis (severe allergic reaction).   ferrous sulfate 325 (65 FE) MG tablet Take 1 tablet (325 mg total) by mouth daily with breakfast.    fluticasone 50 MCG/ACT nasal spray Commonly known as: FLONASE Place 1 spray into both nostrils daily as needed for allergies.   folic acid 1 MG tablet Commonly known as: FOLVITE Take 1 tablet (1 mg total) by mouth daily.   furosemide 80 MG tablet Commonly known as: LASIX Take 1 tablet (80 mg total) by mouth as needed for edema or fluid. What changed:  when to take this reasons to take this   hydrOXYzine 25 MG tablet Commonly known as: ATARAX Take 25 mg by mouth 2 (two) times daily as needed for itching.   isosorbide mononitrate 30 MG 24 hr tablet Commonly known as: IMDUR Take 3 tablets (90 mg total) by mouth daily.   Lidocaine 0.5 % Aero Place 1 spray rectally every 6 (six) hours as needed (hemorrhoids). Preparation H rapid relief lidocaine spray   Linzess 290 MCG  Caps capsule Generic drug: linaclotide Take 290 mcg by mouth every morning.   methocarbamol 500 MG tablet Commonly known as: ROBAXIN Take 1 tablet (500 mg total) by mouth every 6 (six) hours as needed for up to 7 days for muscle spasms.   nitroGLYCERIN 0.4 MG SL tablet Commonly known as: NITROSTAT Place 1 tablet (0.4 mg total) under the tongue every 5 (five) minutes as needed for chest pain.   ondansetron 4 MG tablet Commonly known as: ZOFRAN Take 4 mg by mouth daily as needed for nausea or vomiting.   OVER THE COUNTER MEDICATION Apply 1 patch topically daily as needed (for pain). Lidocaine 4 %   oxyCODONE 5 MG immediate release tablet Commonly known as: Roxicodone Take 1-2 tablets (5-10 mg total) by mouth every 4 (four) hours as needed for up to 7 days for severe pain or moderate pain.   OXYGEN Inhale 2-3 L/min into the lungs continuous.   oxymetazoline 0.05 % nasal spray Commonly known as: AFRIN Place 1 spray into both nostrils 2 (two) times daily as needed for congestion.   pantoprazole 40 MG tablet Commonly known as: PROTONIX Take 1 tablet (40 mg total) by mouth 2 (two) times daily.    polyethylene glycol 17 g packet Commonly known as: MIRALAX / GLYCOLAX Take 17 g by mouth daily as needed (constipation).   rosuvastatin 20 MG tablet Commonly known as: CRESTOR Take 1 tablet (20 mg total) by mouth every evening. NEED OV.   simethicone 80 MG chewable tablet Commonly known as: MYLICON Chew 80 mg by mouth 4 (four) times daily as needed for flatulence.   sucralfate 1 g tablet Commonly known as: CARAFATE Take 1 tablet (1 g total) by mouth 4 (four) times daily -  with meals and at bedtime. What changed: See the new instructions.         Follow-up Information     [name], [name], MD Follow up in 5 day(s).   Specialty: Family Medicine Why: Recheck CBC Contact information: [address] [place] [address] [phone]         Willaim Sheng, MD Follow up in 1 week(s).   Specialty: Orthopedic Surgery Contact information: [address] [place] [address] [phone]                Allergies  Allergen Reactions   Ivp Dye [Iodinated Contrast Media] Shortness Of Breath    No reaction to PO contrast with non-ionic dye.06-25-2014/rsm   Shellfish Allergy Anaphylaxis   Sulfa Antibiotics Shortness Of Breath   Iodine Hives   Atorvastatin Other (See Comments)    Pt states "causes bilateral leg pain/cramps."   Benadryl [Diphenhydramine] Other (See Comments)    "THIS DRIVES ME CRAZY AND MAKES ME FEEL LIKE I AM DYING"    Colchicine Nausea And Vomiting   Doxycycline Other (See Comments)    Unknown reaction, per patient   Uloric [Febuxostat] Other (See Comments)    Unknown reaction   Cephalexin Itching and Rash   Zithromax [Azithromycin] Rash    The results of significant diagnostics from this hospitalization (including imaging, microbiology, ancillary and laboratory) are listed below for reference.    Microbiology: Recent Results (from the past 240 hour(s))  Urine Culture     Status: Abnormal   Collection  Time: 02/09/22 11:49 AM   Specimen: Urine, Clean Catch  Result Value Ref Range Status   Specimen Description URINE, CLEAN CATCH  Final   Special Requests  Final    NONE Performed at [facility], [place] [address], [place], [place] [address]    Culture >=100,000 COLONIES/mL KLEBSIELLA PNEUMONIAE (A)  Final   Report Status 02/11/2022 FINAL  Final   Organism ID, Bacteria KLEBSIELLA PNEUMONIAE (A)  Final      Susceptibility   Klebsiella pneumoniae - MIC*    AMPICILLIN RESISTANT Resistant     CEFAZOLIN <=4 SENSITIVE Sensitive     CEFEPIME <=0.12 SENSITIVE Sensitive     CEFTRIAXONE <=0.25 SENSITIVE Sensitive     CIPROFLOXACIN <=0.25 SENSITIVE Sensitive     GENTAMICIN <=1 SENSITIVE Sensitive     IMIPENEM <=0.25 SENSITIVE Sensitive     NITROFURANTOIN <=16 SENSITIVE Sensitive     TRIMETH/SULFA <=20 SENSITIVE Sensitive     AMPICILLIN/SULBACTAM <=2 SENSITIVE Sensitive     PIP/TAZO <=4 SENSITIVE Sensitive     * >=100,000 COLONIES/mL KLEBSIELLA PNEUMONIAE  Surgical pcr screen     Status: None   Collection Time: 02/12/22 12:23 AM   Specimen: Nasal Mucosa; Nasal Swab  Result Value Ref Range Status   MRSA, PCR NEGATIVE NEGATIVE Final   Staphylococcus aureus NEGATIVE NEGATIVE Final    Comment: (NOTE) The Xpert SA Assay (FDA approved for NASAL specimens in patients 65 years of age and older), is one component of a comprehensive surveillance program. It is not intended to diagnose infection nor to guide or monitor treatment. Performed at [facility], [place] [address], [place], [place] [address]   Aerobic/Anaerobic Culture w Gram Stain (surgical/deep wound)     Status: None   Collection Time: 02/12/22  1:51 PM   Specimen: Soft Tissue, Other  Result Value Ref Range Status   Specimen Description TISSUE HIP LEFT  Final   Special Requests A  Final   Gram Stain   Final    RARE WBC PRESENT, PREDOMINANTLY MONONUCLEAR NO ORGANISMS SEEN CRITICAL RESULT CALLED TO, READ BACK BY AND  VERIFIED WITH: RN Julieta Gutting [id number] AT 1437 BY CM    Culture   Final    No growth aerobically or anaerobically. Performed at [facility], [place] [address] [place], [place] [address]    Report Status 02/17/2022 FINAL  Final  Aerobic/Anaerobic Culture w Gram Stain (surgical/deep wound)     Status: None   Collection Time: 02/12/22  2:08 PM   Specimen: Soft Tissue, Other  Result Value Ref Range Status   Specimen Description TISSUE LEFT FEMUR CANAL  Final   Special Requests B  Final   Gram Stain   Final    RARE WBC PRESENT,BOTH PMN AND MONONUCLEAR NO ORGANISMS SEEN Gram Stain Report Called to,Read Back By and Verified With: DR.MARSHONIE [id number] '@1510'$  FH    Culture   Final    No growth aerobically or anaerobically. Performed at [facility], [place] [address], [place], [place] [address]    Report Status 02/17/2022 FINAL  Final  Aerobic/Anaerobic Culture w Gram Stain (surgical/deep wound)     Status: None   Collection Time: 02/12/22  2:39 PM   Specimen: Soft Tissue, Other  Result Value Ref Range Status   Specimen Description TISSUE LEFT HIP  Final   Special Requests SAMPLE NO 1 PT ON VANC, UNASYN  Final   Gram Stain NO WBC SEEN NO ORGANISMS SEEN   Final   Culture   Final    No growth aerobically or anaerobically. Performed at [facility], [place] [address] [place],  [address]    Report  Status 02/17/2022 FINAL  Final  Aerobic/Anaerobic Culture w Gram Stain (surgical/deep wound)     Status: None   Collection Time: 02/12/22  2:44 PM   Specimen: Soft Tissue, Other  Result Value Ref Range Status   Specimen Description TISSUE LEFT HIP  Final   Special Requests SAMPLE NO 2 PT ON VANC, UNASYN  Final   Gram Stain NO WBC SEEN NO ORGANISMS SEEN   Final   Culture   Final    No growth aerobically or anaerobically. Performed at [facility], [place] [address], [place], [place] [address]    Report Status 02/17/2022 FINAL  Final  Aerobic/Anaerobic Culture w Gram Stain  (surgical/deep wound)     Status: None   Collection Time: 02/12/22  2:44 PM   Specimen: Soft Tissue, Other  Result Value Ref Range Status   Specimen Description TISSUE LEFT HIP  Final   Special Requests SAMPLE NO 3 PT ON VANC , UNASYN  Final   Gram Stain NO WBC SEEN NO ORGANISMS SEEN   Final   Culture   Final    No growth aerobically or anaerobically. Performed at [facility], [place] [address], [place], [place] [address]    Report Status 02/17/2022 FINAL  Final    Procedures/Studies: DG HIP UNILAT WITH PELVIS 2-3 VIEWS LEFT  Result Date: 02/17/2022 CLINICAL DATA:  [id number] Pain [id number] EXAM: DG HIP (WITH OR WITHOUT PELVIS) 2-3V LEFT COMPARISON:  X-ray pelvis 02/12/2022, CT pelvis 02/06/2022, x-ray pelvis 01/26/2022 FINDINGS: Limited evaluation due to overlapping osseous structures and overlying soft tissues. Total right hip arthroplasty with associated plate and screw fixation and cerclage wires of the proximal femur. No radiographic findings suggest surgical hardware complication. Right hip demonstrates no acute displaced fracture or dislocation on frontal view. No acute displaced fracture or diastasis of the bones of the pelvis. Lucency overlying the right inferior pubic rami acute likely due to overlapping bowel. At least mild degenerative changes of the right hip. Vascular calcifications. IMPRESSION: 1. Negative for acute traumatic injury. 2. Total right hip arthroplasty with associated plate and screw fixation and cerclage wires of the proximal femur. No radiographic findings suggest surgical hardware complication. Electronically Signed   By: Iven Finn M.D.   On: 02/17/2022 23:25   DG HIP UNILAT W OR W/O PELVIS 2-3 VIEWS LEFT  Result Date: 02/12/2022 CLINICAL DATA:  Status post nail removal with conversion to total left hip arthroplasty. Postoperative. EXAM: DG HIP (WITH OR WITHOUT PELVIS) 2-3V LEFT COMPARISON:  Left humeral radiographs 02/05/2022 FINDINGS: Interval removal of the  prior left femoral long intramedullary nail and femoral neck screw. New total left hip arthroplasty hardware with moderately long femoral stem. Proximal femoral greater trochanter and diaphyseal upper plate with 2 associated cerclage wires around the femoral stem prosthesis. Near anatomic alignment with unchanged medial displacement of the lesser trochanter. Moderate right femoroacetabular joint space narrowing. IMPRESSION: Interval removal of the prior left femoral long intramedullary nail and femoral neck screw. New total left hip arthroplasty hardware with moderately long femoral stem, cerclage wires, and proximal lateral femoral hook plate. Electronically Signed   By: Yvonne Kendall M.D.   On: 02/12/2022 18:15   DG HIP UNILAT WITH PELVIS 1V LEFT  Result Date: 02/12/2022 CLINICAL DATA:  Removal of femoral nail and conversion to total left hip arthroplasty. EXAM: DG HIP (WITH OR WITHOUT PELVIS) 1V*L* COMPARISON:  Left femur radiographs 02/06/2022, CT left femur 09/10/2021 FINDINGS: Images were performed intraoperatively without the presence of a  radiologist. The patient is undergoing removal of the prior long femoral intramedullary nail. Placement of new left hip arthroplasty hardware with moderately long femoral stem. Two proximal femoral stem and cerclage wires with associated lateral greater trochanter proximal femoral plate. No hardware complication is seen. Total fluoroscopy images: 12 Total fluoroscopy time: 34 seconds Total dose: Radiation Exposure Index (as provided by the fluoroscopic device): 7.37 mGy air Kerma Please see intraoperative findings for further detail. IMPRESSION: Intraoperative fluoroscopic guidance for removal of prior left femoral nail and placement of left hip arthroplasty. Electronically Signed   By: Yvonne Kendall M.D.   On: 02/12/2022 16:32   DG C-Arm 1-60 Min-No Report  Result Date: 02/12/2022 Fluoroscopy was utilized by the requesting physician.  No radiographic interpretation.    DG C-Arm 1-60 Min-No Report  Result Date: 02/12/2022 Fluoroscopy was utilized by the requesting physician.  No radiographic interpretation.   DG C-Arm 1-60 Min-No Report  Result Date: 02/12/2022 Fluoroscopy was utilized by the requesting physician.  No radiographic interpretation.   DG Chest 1 View  Result Date: 02/09/2022 CLINICAL DATA:  Shortness of breath. EXAM: CHEST  1 VIEW COMPARISON:  01/26/2022 and prior radiographs FINDINGS: Cardiomegaly, CABG changes and RIGHT pacemaker/AICD again noted. Mild pulmonary vascular congestion noted. Mild LEFT basilar scarring/atelectasis again noted. There is no evidence of focal airspace disease, pulmonary edema, suspicious pulmonary nodule/mass, pleural effusion, or pneumothorax. No acute bony abnormalities are identified. Thoracic spinal fixation hardware noted. IMPRESSION: Cardiomegaly with mild pulmonary vascular congestion. Electronically Signed   By: Margarette Canada M.D.   On: 02/09/2022 18:30   DG FLUORO GUIDED NEEDLE PLC ASPIRATION/INJECTION LOC  Result Date: 02/07/2022 INDICATION: Nonunited intertrochanteric left femur fracture with screw migration. Left hip joint aspiration requested to evaluate for infection prior to revision surgery. EXAM: ARTHROCENTESIS/INJECTION OF LARGE JOINT COMPARISON:  CT pelvis 01/17/2022. Left femur radiographs 01/17/2022. CONTRAST:  None FLUOROSCOPY TIME:  Radiation Exposure Index (as provided by the fluoroscopic device): 74.0 mGy Kerma COMPLICATIONS: None PROCEDURE: Informed written consent was obtained from the patient after discussion of the risks, benefits and alternatives to treatment. The patient was placed supine on the fluoroscopy table and the left extremity was placed in a slight degree of flexion and internal rotation. The left hip was localized with fluoroscopy. The skin overlying the anterior aspect of the hip was prepped and draped in usual sterile fashion and anesthetized with 1% lidocaine. An 18 gauge spinal  needle was advanced into the hip joint at the lateral aspect of the femoral head-neck junction. A fluoroscopic image was saved and sent to PACS. 2 mL of serosanguinous fluid were aspirated. A small amount of saline was injected into the joint space and aspirated with negligible return. The syringe was capped and sent for the requested laboratory studies. The needle was removed and a dressing was placed. The patient tolerated the procedure well without immediate postprocedural complication. The procedure was performed by Narda Rutherford, NP and supervised by Logan Bores, MD. IMPRESSION: Successful fluoroscopic guided aspiration of the left hip. Read by: Narda Rutherford, AGNP-BC Electronically Signed   By: Logan Bores M.D.   On: 02/07/2022 15:38   DG FEMUR MIN 2 VIEWS LEFT  Result Date: 02/06/2022 CLINICAL DATA:  Left leg pain. EXAM: LEFT FEMUR 2 VIEWS COMPARISON:  April 04, 2021.  January 26, 2022. FINDINGS: Status post intramedullary rod fixation of left femur and surgical internal fixation of proximal left femur fracture. There is stable appearance of the old proximal left femoral fracture. No acute fracture  or dislocation is noted. IMPRESSION: Stable postsurgical changes as described above. No definite acute abnormality seen. Electronically Signed   By: Marijo Conception M.D.   On: 02/06/2022 16:23   CT PELVIS WO CONTRAST  Result Date: 02/06/2022 CLINICAL DATA:  [place] 01/26/2022, inability to ambulate, left groin and hip pain EXAM: CT PELVIS WITHOUT CONTRAST TECHNIQUE: Multidetector CT imaging of the pelvis was performed following the standard protocol without intravenous contrast. RADIATION DOSE REDUCTION: This exam was performed according to the departmental dose-optimization program which includes automated exposure control, adjustment of the mA and/or kV according to patient size and/or use of iterative reconstruction technique. COMPARISON:  01/26/2022, 09/09/2021 FINDINGS: Urinary Tract: No urinary tract  calculi or obstructive uropathy. The bladder is unremarkable. Bowel: No bowel obstruction or ileus. Colonic diverticulosis without diverticulitis. No bowel wall thickening or inflammatory change. Moderate retained stool throughout the colon. Vascular/Lymphatic: Diffuse atherosclerosis of the aorta and its branches. No pathologic adenopathy. Reproductive:  No mass or other significant abnormality Other: No free fluid or free intraperitoneal gas. No abdominal wall hernia. Musculoskeletal: There has been displacement of the intramedullary rod and proximal dynamic screw traversing the prior comminuted left hip fracture. The dynamic screw now traverses the femoral head, joint space, and extends through the left acetabulum. Increased impaction at the prior intertrochanteric left hip fracture site may be due to recent trauma or chronic nonunion. There is marked soft tissue density surrounding the fracture site with developing heterotopic ossification. No significant callus formation along the fracture margins however. There are no other acute displaced fractures. Marked degenerative changes are seen within the lumbar spine, with stable grade 1 degenerative anterolisthesis of L5 on S1. Reconstructed images demonstrate no additional findings. IMPRESSION: 1. Interval migration of the intramedullary rod and proximal dynamic screw traversing the prior intertrochanteric left hip fracture. The screw now traverses the femoral head and joint space, and extends through the left acetabulum. Increased impaction and comminution at the prior fracture site may reflect sequela of superimposed acute injury after fall 01/26/2022, or ongoing sequela of chronic nonunion. Orthopedic consultation is recommended. 2. No other acute displaced fractures. 3. Colonic diverticulosis without diverticulitis. Electronically Signed   By: Randa Ngo M.D.   On: 02/06/2022 15:59   CT CERVICAL SPINE WO CONTRAST  Result Date: 01/26/2022 CLINICAL DATA:   Fall. Blunt head and neck trauma. Head and neck pain. EXAM: CT CERVICAL SPINE WITHOUT CONTRAST TECHNIQUE: Multidetector CT imaging of the cervical spine was performed without intravenous contrast. Multiplanar CT image reconstructions were also generated. RADIATION DOSE REDUCTION: This exam was performed according to the departmental dose-optimization program which includes automated exposure control, adjustment of the mA and/or kV according to patient size and/or use of iterative reconstruction technique. COMPARISON:  09/09/2021 FINDINGS: Alignment: Normal. Skull base and vertebrae: No acute fracture. No primary bone lesion or focal pathologic process. Soft tissues and spinal canal: No prevertebral fluid or swelling. No visible canal hematoma. Disc levels: Prior ACDF again seen at C4-5. Moderate to severe degenerative disc disease at C3-4 C5-6, and C6-7. Mild cervical facet DJD again seen bilaterally at multiple levels. Atlantoaxial degenerative changes again noted. Upper chest: No acute findings. Other: Right carotid stent again noted. IMPRESSION: No evidence of acute cervical spine fracture or subluxation. Degenerative spondylosis, as described above. Electronically Signed   By: Marlaine Hind M.D.   On: 01/26/2022 15:07   DG Pelvis Portable  Result Date: 01/26/2022 CLINICAL DATA:  Fall with pelvic pain. EXAM: PORTABLE PELVIS 1 VIEWS COMPARISON:  09/09/2021  FINDINGS: No acute fracture, subluxation or dislocation identified. Surgical hardware and remote fracture of the proximal LEFT femur again noted. Degenerative changes in the LOWER lumbar spine identified. IMPRESSION: No acute bony abnormality. Electronically Signed   By: Margarette Canada M.D.   On: 01/26/2022 15:05   DG Chest Portable 1 View  Result Date: 01/26/2022 CLINICAL DATA:  Fall. EXAM: PORTABLE CHEST 1 VIEW COMPARISON:  11/01/2021 chest radiograph prior studies FINDINGS: Cardiomegaly LEFT-sided pacemaker/ICD and CABG changes again noted. There is no  evidence of focal airspace disease, pulmonary edema, suspicious pulmonary nodule/mass, pleural effusion, or pneumothorax. No acute bony abnormalities are identified. Thoracic fusion hardware again noted. IMPRESSION: No active disease. Electronically Signed   By: Margarette Canada M.D.   On: 01/26/2022 15:04   CT HEAD WO CONTRAST (5MM)  Result Date: 01/26/2022 CLINICAL DATA:  Fall.  Blunt head trauma.  Headache.  On Plavix. EXAM: CT HEAD WITHOUT CONTRAST TECHNIQUE: Contiguous axial images were obtained from the base of the skull through the vertex without intravenous contrast. RADIATION DOSE REDUCTION: This exam was performed according to the departmental dose-optimization program which includes automated exposure control, adjustment of the mA and/or kV according to patient size and/or use of iterative reconstruction technique. COMPARISON:  09/09/2021 FINDINGS: Brain: No evidence of intracranial hemorrhage, acute infarction, hydrocephalus, extra-axial collection, or mass lesion/mass effect. Stable mild cerebral atrophy and chronic small vessel disease. Vascular:  No hyperdense vessel or other acute findings. Skull: No evidence of fracture or other significant bone abnormality. Sinuses/Orbits:  No acute findings. Other: None. IMPRESSION: No acute intracranial abnormality. Stable mild cerebral atrophy and chronic small vessel disease. Electronically Signed   By: Marlaine Hind M.D.   On: 01/26/2022 15:03    Labs: BNP (last 3 results) Recent Labs    09/02/21 1221 09/09/21 1438 11/01/21 1214  BNP 798.8* 569.7* 330.0*   Basic Metabolic Panel: Recent Labs  Lab 02/13/22 0351 02/13/22 0352 02/14/22 0458 02/15/22 0357 02/16/22 0331 02/17/22 0404 02/18/22 0418 02/19/22 0334  NA 137   < > 137 136 136 135 136 135  K 4.2   < > 3.7 3.4* 3.4* 3.2* 3.8 3.8  CL 100   < > 99 100 99 99 99 97*  CO2 25   < > '27 25 27 25 24 26  '$ GLUCOSE 164*   < > 129* 120* 121* 114* 109* 135*  BUN 62*   < > 71* 69* 72* 71* 68* 67*   CREATININE 2.71*   < > 2.98* 3.06* 3.22* 3.12* 3.29* 3.19*  CALCIUM 8.7*   < > 8.4* 8.5* 8.6* 8.7* 9.1 9.3  MG 1.9  --  1.9 1.8  --   --   --   --   PHOS  --    < > 4.4 3.5 3.8 3.7 3.5 3.9   < > = values in this interval not displayed.   Liver Function Tests: Recent Labs  Lab 02/15/22 0357 02/16/22 0331 02/17/22 0404 02/18/22 0418 02/19/22 0334  ALBUMIN 2.2* 1.9* 1.8* 1.9* 1.9*   No results for input(s): "LIPASE", "AMYLASE" in the last 168 hours. No results for input(s): "AMMONIA" in the last 168 hours. CBC: Recent Labs  Lab 02/13/22 0351 02/14/22 0456 02/15/22 0357 02/18/22 0418 02/19/22 0334  WBC 8.1 9.7 9.2 8.1 8.2  HGB 6.7* 6.9* 8.1* 7.6* 7.5*  HCT 22.2* 21.1* 23.7* 23.7* 23.9*  MCV 96.1 91.3 90.5 94.4 94.8  PLT 134* 136* 148* 193 211   Cardiac Enzymes: No results for input(s): "  CKTOTAL", "CKMB", "CKMBINDEX", "TROPONINI" in the last 168 hours. BNP: Invalid input(s): "POCBNP" CBG: Recent Labs  Lab 02/16/22 1146 02/16/22 1643 02/16/22 2241 02/17/22 2031 02/18/22 2034  GLUCAP 145* 129* 171* 172* 133*   D-Dimer No results for input(s): "DDIMER" in the last 72 hours. Hgb A1c No results for input(s): "HGBA1C" in the last 72 hours. Lipid Profile No results for input(s): "CHOL", "HDL", "LDLCALC", "TRIG", "CHOLHDL", "LDLDIRECT" in the last 72 hours. Thyroid function studies No results for input(s): "TSH", "T4TOTAL", "T3FREE", "THYROIDAB" in the last 72 hours.  Invalid input(s): "FREET3" Anemia work up No results for input(s): "VITAMINB12", "FOLATE", "FERRITIN", "TIBC", "IRON", "RETICCTPCT" in the last 72 hours. Urinalysis    Component Value Date/Time   COLORURINE YELLOW 02/06/2022 2256   APPEARANCEUR CLOUDY (A) 02/06/2022 2256   LABSPEC 1.013 02/06/2022 2256   PHURINE 5.0 02/06/2022 2256   GLUCOSEU NEGATIVE 02/06/2022 2256   HGBUR SMALL (A) 02/06/2022 2256   BILIRUBINUR NEGATIVE 02/06/2022 2256   KETONESUR NEGATIVE 02/06/2022 2256   PROTEINUR 30 (A)  02/06/2022 2256   UROBILINOGEN 0.2 12/06/2014 1531   NITRITE POSITIVE (A) 02/06/2022 2256   LEUKOCYTESUR LARGE (A) 02/06/2022 2256   Sepsis Labs Recent Labs  Lab 02/14/22 0456 02/15/22 0357 02/18/22 0418 02/19/22 0334  WBC 9.7 9.2 8.1 8.2   Microbiology Recent Results (from the past 240 hour(s))  Urine Culture     Status: Abnormal   Collection Time: 02/09/22 11:49 AM   Specimen: Urine, Clean Catch  Result Value Ref Range Status   Specimen Description URINE, CLEAN CATCH  Final   Special Requests   Final    NONE Performed at [facility], [place] [address] [place], [place] [address]    Culture >=100,000 COLONIES/mL KLEBSIELLA PNEUMONIAE (A)  Final   Report Status 02/11/2022 FINAL  Final   Organism ID, Bacteria KLEBSIELLA PNEUMONIAE (A)  Final      Susceptibility   Klebsiella pneumoniae - MIC*    AMPICILLIN RESISTANT Resistant     CEFAZOLIN <=4 SENSITIVE Sensitive     CEFEPIME <=0.12 SENSITIVE Sensitive     CEFTRIAXONE <=0.25 SENSITIVE Sensitive     CIPROFLOXACIN <=0.25 SENSITIVE Sensitive     GENTAMICIN <=1 SENSITIVE Sensitive     IMIPENEM <=0.25 SENSITIVE Sensitive     NITROFURANTOIN <=16 SENSITIVE Sensitive     TRIMETH/SULFA <=20 SENSITIVE Sensitive     AMPICILLIN/SULBACTAM <=2 SENSITIVE Sensitive     PIP/TAZO <=4 SENSITIVE Sensitive     * >=100,000 COLONIES/mL KLEBSIELLA PNEUMONIAE  Surgical pcr screen     Status: None   Collection Time: 02/12/22 12:23 AM   Specimen: Nasal Mucosa; Nasal Swab  Result Value Ref Range Status   MRSA, PCR NEGATIVE NEGATIVE Final   Staphylococcus aureus NEGATIVE NEGATIVE Final    Comment: (NOTE) The Xpert SA Assay (FDA approved for NASAL specimens in patients 68 years of age and older), is one component of a comprehensive surveillance program. It is not intended to diagnose infection nor to guide or monitor treatment. Performed at [facility], [place] [address] [place],  [address]   Aerobic/Anaerobic Culture w Gram  Stain (surgical/deep wound)     Status: None   Collection Time: 02/12/22  1:51 PM   Specimen: Soft Tissue, Other  Result Value Ref Range Status   Specimen Description TISSUE HIP LEFT  Final   Special Requests A  Final   Gram Stain   Final    RARE WBC PRESENT, PREDOMINANTLY MONONUCLEAR NO ORGANISMS SEEN CRITICAL  RESULT CALLED TO, READ BACK BY AND VERIFIED WITH: RN Julieta Gutting [id number] AT 1437 BY CM    Culture   Final    No growth aerobically or anaerobically. Performed at [facility], [place] [address], [place], [place] [address]    Report Status 02/17/2022 FINAL  Final  Aerobic/Anaerobic Culture w Gram Stain (surgical/deep wound)     Status: None   Collection Time: 02/12/22  2:08 PM   Specimen: Soft Tissue, Other  Result Value Ref Range Status   Specimen Description TISSUE LEFT FEMUR CANAL  Final   Special Requests B  Final   Gram Stain   Final    RARE WBC PRESENT,BOTH PMN AND MONONUCLEAR NO ORGANISMS SEEN Gram Stain Report Called to,Read Back By and Verified With: DR.MARSHONIE [id number] '@1510'$  FH    Culture   Final    No growth aerobically or anaerobically. Performed at [facility], [place] [address] [place], [place] [address]    Report Status 02/17/2022 FINAL  Final  Aerobic/Anaerobic Culture w Gram Stain (surgical/deep wound)     Status: None   Collection Time: 02/12/22  2:39 PM   Specimen: Soft Tissue, Other  Result Value Ref Range Status   Specimen Description TISSUE LEFT HIP  Final   Special Requests SAMPLE NO 1 PT ON VANC, UNASYN  Final   Gram Stain NO WBC SEEN NO ORGANISMS SEEN   Final   Culture   Final    No growth aerobically or anaerobically. Performed at [facility], [place] [address], [name], [place] [address]    Report Status 02/17/2022 FINAL  Final  Aerobic/Anaerobic Culture w Gram Stain (surgical/deep wound)     Status: None   Collection Time: 02/12/22  2:44 PM   Specimen: Soft Tissue, Other  Result Value Ref Range Status   Specimen Description  TISSUE LEFT HIP  Final   Special Requests SAMPLE NO 2 PT ON VANC, UNASYN  Final   Gram Stain NO WBC SEEN NO ORGANISMS SEEN   Final   Culture   Final    No growth aerobically or anaerobically. Performed at [facility], [place] [address], [place], [place] [address]    Report Status 02/17/2022 FINAL  Final  Aerobic/Anaerobic Culture w Gram Stain (surgical/deep wound)     Status: None   Collection Time: 02/12/22  2:44 PM   Specimen: Soft Tissue, Other  Result Value Ref Range Status   Specimen Description TISSUE LEFT HIP  Final   Special Requests SAMPLE NO 3 PT ON VANC , UNASYN  Final   Gram Stain NO WBC SEEN NO ORGANISMS SEEN   Final   Culture   Final    No growth aerobically or anaerobically. Performed at [facility], [place] [address] [place], [place] [address]    Report Status 02/17/2022 FINAL  Final  Time coordinating discharge: 35 minutes SIGNED: Antonieta Pert, MD  [facility] 02/19/2022, 11:05 AM  If 7PM-7AM, please contact night-coverage [email or web address]

## 2022-02-19 NOTE — Progress Notes (Signed)
Called number provided [phone]) to give report.  No one answered.  Called back multiple times and only got a voicemail box.  Did not leave any information due to potential HIPAA violation.  Report not given.

## 2022-02-19 NOTE — TOC Progression Note (Signed)
Transition of Care [facility]) - Progression Note    Patient Details  Name: [name] MRN: [id number] Date of Birth: 12-30-1945  Transition [facility]) CM/SW Contact  Joanne Chars, LCSW Phone Number: 02/19/2022, 9:46 AM  Clinical Narrative:   SNF auth approved in [place]: [id number], [id number], 3 days: 1/10-1/12.  MD notified.    Expected Discharge Plan: [place] Barriers to Discharge: SNF Pending bed offer  Expected Discharge Plan and Services In-house Referral: Clinical Social Work   Post Acute Care Choice: [place] Living arrangements for the past 2 months: [place] Determinants of Health (SDOH) Interventions SDOH Screenings   Food Insecurity: No Food Insecurity (02/06/2022)  Recent Concern: [name] (11/19/2021)  Housing: Low Risk  (02/06/2022)  Transportation Needs: Unmet Transportation Needs (02/06/2022)  Utilities: Not At Risk (02/06/2022)  Depression (PHQ2-9): Low Risk  (11/19/2021)  Financial Resource Strain: High Risk (12/18/2016)  Physical Activity: Inactive (12/18/2016)  Social Connections: Moderately Integrated (02/05/2021)  Stress: Stress Concern Present (12/18/2016)  Tobacco Use: Medium Risk (02/18/2022)    Readmission Risk Interventions    01/10/2021    9:47 AM  Readmission Risk Prevention Plan  Transportation Screening Complete  PCP or Specialist Appt within 3-5 Days Complete  HRI or [place] Complete  Social Work Consult for [place] Planning/Counseling Complete  Palliative Care Screening Not Applicable  Medication Review Press photographer) Complete

## 2022-02-19 NOTE — Plan of Care (Signed)
  Problem: Health Behavior/Discharge Planning: Goal: Ability to manage health-related needs will improve Outcome: Progressing   Problem: Clinical Measurements: Goal: Will remain free from infection Outcome: Progressing   Problem: Activity: Goal: Risk for activity intolerance will decrease Outcome: Progressing   Problem: Skin Integrity: Goal: Risk for impaired skin integrity will decrease Outcome: Progressing   

## 2022-02-19 NOTE — Progress Notes (Signed)
Initial Nutrition Assessment  DOCUMENTATION CODES:   Obesity unspecified  INTERVENTION:  Change diet order to dysphagia 3 for ease of chewing d/t dental status Ensure Enlive po BID, each supplement provides 350 kcal and 20 grams of protein.  NUTRITION DIAGNOSIS:   Increased nutrient needs related to acute illness as evidenced by estimated needs  GOAL:   Patient will meet greater than or equal to 90% of their needs  MONITOR:   PO intake, Supplement acceptance, Labs, Weight trends  REASON FOR ASSESSMENT:   LOS    ASSESSMENT:   Pt admitted d/t pain from implanted hardware of L hip. PMH significant for CAD s/p CABG, HFpEF, obesity, adrenal insuffiencey, diabetes, HTN, CKD stage III, OSA/obesity-hypoventilation syndrome, complete heart block s/p PPM/ICD. Food allergies: shellfish  01/03: s/p removal of L cephalomedullary femoral nail, conversion of L THA, ORIF of greater trochanter fracture  Pt states that she has not had much of an appetite during admission which has lead to decreased PO intake from her baseline. She states that some days are better than others. Pt recalls eating 3 meals per day at home with fruit for snacks. She typically eats baked or air-fried chicken, [place] or fish with fruits and vegetables and diet gingerale for beverages. She recently was fit for new dental plates but has not gotten used to them. She is requiring softer foods at this time for ease of chewing. Adjusted diet order accordingly.   Meal completions: 1/4: 100% breakfast, 50% lunch 1/6: 25% breakfast, 50% lunch, 25% dinner 1/7: 75% breakfast, 80% dinner 1/10: 100% breakfast  Reviewed weight history. She is noted to have had a weight loss of 15.2% within the last 6 months. Pt reports that she has intentionally been trying to lose weight   Medications: ferrous sulfate, folvite, linzess, protonix, miralax (not given today), senna (not given today), carafate  Labs: BUN 67, Cr 3.19, GFR 14,  HgbA1c 5.6% (10/2021), CBG's 122. 133  NUTRITION - FOCUSED PHYSICAL EXAM:  Flowsheet Row Most Recent Value  Orbital Region No depletion  Upper Arm Region No depletion  Thoracic and Lumbar Region No depletion  Buccal Region No depletion  Temple Region No depletion  Clavicle Bone Region No depletion  Clavicle and Acromion Bone Region No depletion  Scapular Bone Region No depletion  Dorsal Hand Mild depletion  Patellar Region Mild depletion  Anterior Thigh Region Mild depletion  Posterior Calf Region No depletion  Edema (RD Assessment) None  Hair Reviewed  Eyes Reviewed  Mouth Reviewed  Skin Reviewed  Nails Other (Comment)  [wears dentures]       Diet Order:   Diet Order             DIET DYS 3 Room service appropriate? Yes; Fluid consistency: Thin  Diet effective now                   EDUCATION NEEDS:   Education needs have been addressed  Skin:  Skin Assessment: Reviewed RN Assessment (L leg incision (closed))  Last BM:  1/9  Height:   Ht Readings from Last 1 Encounters:  02/12/22 '5\' 3"'$  (1.6 m)    Weight:   Wt Readings from Last 1 Encounters:  02/12/22 85.7 kg    Ideal Body Weight:  52.3 kg  BMI:  Body mass index is 33.48 kg/m.  Estimated Nutritional Needs:   Kcal:  1400-1600  Protein:  70-85g  Fluid:  1.4-1.6L  Clayborne Dana, RDN, LDN Clinical Nutrition

## 2022-02-19 NOTE — Progress Notes (Signed)
Physical Therapy Treatment Patient Details Name: [name] MRN: [id number] DOB: 02-29-1945 Today's Date: 02/19/2022   History of Present Illness Pt is a 77 y/o F admitted on 02/06/22. Pt with recent hx of L hip & L superior pubic ramus fx & had f/u scan showing atrophic non union & pt started developing R hip pain & inability to walk. Pt was admitted & imaging showed cephalomedullary screw migration of the intertrochanteric nailing s/p fall 2 days prior to admission. Pt underwent removal of left cephalomedullary femoral nail, conversion of left total hip arthroplasty, ORIF of greater trochanter fracture with trochanteric hook plate on 10/13/7340. PMH: CAD s/p CABG, HFpEF, obesity, adrenal insufficiency, DM, HTN, CKD3, OSA/obesity-hypoventilation syndrome on 3L O2 chronically, complete heart block s/p PPM/ICD    PT Comments    Pt is progressing with goals. Currently has more difficulty maintaining WB precaution through the LLE due to fatigue in the bil UE but demonstrates improved sit to stand and bed mobility from previous sessions. Pt endurance is improving for functional activities and was reporting less pain this session from previous session. Currently due to pt level of function, PLOF, home set up and available assistance at home recommending skilled physical therapy services in SNF setting on discharge from acute care hospital setting in order to decrease risk for falls, injury, immobility and re-hospitalization.     Recommendations for follow up therapy are one component of a multi-disciplinary discharge planning process, led by the attending physician.  Recommendations may be updated based on patient status, additional functional criteria and insurance authorization.  Follow Up Recommendations  Skilled nursing-short term rehab (<3 hours/day) Can patient physically be transported by private vehicle: No   Assistance Recommended at Discharge Frequent or constant Supervision/Assistance   Patient can return home with the following Two people to help with walking and/or transfers;Help with stairs or ramp for entrance;Assist for transportation;Assistance with cooking/housework   Equipment Recommendations  None recommended by PT    Recommendations for Other Services       Precautions / Restrictions Precautions Precautions: Fall;Posterior Hip Precaution Comments: LLE no active hip abduction Restrictions Weight Bearing Restrictions: Yes LLE Weight Bearing: Partial weight bearing LLE Partial Weight Bearing Percentage or Pounds: 50     Mobility  Bed Mobility Overal bed mobility: Needs Assistance Bed Mobility: Supine to Sit, Sit to Supine     Supine to sit: Mod assist, HOB elevated Sit to supine: Mod assist   General bed mobility comments: Pt able to move RLE and rotate trunk to the right to reach for bed railing. Therapist off loaded LLE and assisted with abduction due to orders of no Active abduction to allow patient to position LE Patient Response: Cooperative  Transfers Overall transfer level: Needs assistance Equipment used: Rolling walker (2 wheels) Transfers: Sit to/from Stand Sit to Stand: Mod assist, From elevated surface           General transfer comment: Pt completed 2x Sit <> Stand from EOB standing for about 1 min each trial. Pt educated on 50% WB status    Ambulation/Gait     General Gait Details: did not attempt today pt has difficulty maintaining 50% WB precaution due to UE weakness.           Balance Overall balance assessment: Needs assistance Sitting-balance support: No upper extremity supported, Feet supported Sitting balance-Leahy Scale: Good Sitting balance - Comments: sitting EOB   Standing balance support: Bilateral upper extremity supported, During functional activity, Reliant on assistive device for  balance Standing balance-Leahy Scale: Fair Standing balance comment: standing at EOB pt needed no physical assistance to  maintain standing once in standing.      Cognition Arousal/Alertness: Awake/alert Behavior During Therapy: WFL for tasks assessed/performed Overall Cognitive Status: Within Functional Limits for tasks assessed           General Comments General comments (skin integrity, edema, etc.): Pt has some difficulty currently maintaining WB precautions due to weakness in bil UE unable to transfer to the chair today. Performed 2x trial of standing EOB. Pt scooted at Max A toward the L side of the bed with assistance with the LLE due to no active hip abd. Improved from last session when pt was unable to clear buttocks from EOB      Pertinent Vitals/Pain Pain Assessment Pain Assessment: Faces Faces Pain Scale: Hurts little more Pain Location: L hip Pain Descriptors / Indicators: Discomfort, Guarding, Aching, Grimacing Pain Intervention(s): Limited activity within patient's tolerance, Monitored during session, Repositioned     PT Goals (current goals can now be found in the care plan section) Acute Rehab PT Goals Patient Stated Goal: decreased pain PT Goal Formulation: With patient Time For Goal Achievement: 02/28/22 Potential to Achieve Goals: Good Progress towards PT goals: Progressing toward goals    Frequency    Min 3X/week      PT Plan Current plan remains appropriate       AM-PAC PT "6 Clicks" Mobility   Outcome Measure  Help needed turning from your back to your side while in a flat bed without using bedrails?: A Lot Help needed moving from lying on your back to sitting on the side of a flat bed without using bedrails?: A Lot Help needed moving to and from a bed to a chair (including a wheelchair)?: A Lot Help needed standing up from a chair using your arms (e.g., wheelchair or bedside chair)?: A Lot Help needed to walk in hospital room?: Total Help needed climbing 3-5 steps with a railing? : Total 6 Click Score: 10    End of Session Equipment Utilized During  Treatment: Gait belt;Oxygen Activity Tolerance: Patient tolerated treatment well;Patient limited by pain Patient left: with call bell/phone within reach;in bed;with bed alarm set Nurse Communication: Mobility status PT Visit Diagnosis: Pain;Difficulty in walking, not elsewhere classified (R26.2);Other abnormalities of gait and mobility (R26.89);Muscle weakness (generalized) (M62.81) Pain - Right/Left: Left Pain - part of body: Hip     Time: 1136-1205 PT Time Calculation (min) (ACUTE ONLY): 29 min  Charges:  $Therapeutic Activity: 23-37 mins                     Tomma Rakers, DPT, CLT  Acute Rehabilitation Services Office: [phone] (Secure chat preferred)    Ander Purpura 02/19/2022, 1:30 PM

## 2022-02-19 NOTE — Plan of Care (Signed)
Patient appropriate for discharge to SNF facility.

## 2022-02-19 NOTE — Progress Notes (Signed)
AVS reviewed with patient, Patient excited for discharge.  All questions answered.

## 2022-02-19 NOTE — TOC Transition Note (Signed)
Transition of Care [facility]) - CM/SW Discharge Note   Patient Details  Name: [name] MRN: [id number] Date of Birth: [birth year]  [facility]) CM/SW Contact:  Joanne Chars, LCSW Phone Number: 02/19/2022, 1:57 PM   Clinical Narrative:   Pt discharging to [facility].  RN call report to [phone].     Final next level of care: [place] Barriers to Discharge: Barriers Resolved   Patient Goals and CMS Choice   Choice offered to / list presented to : Patient  Discharge Placement                Patient chooses bed at:  [facility]) Patient to be transferred to facility by: [place] Name of family member notified: son [name] Patient and family notified of of transfer: 02/19/22  Discharge Plan and Services Additional resources added to the After Visit Summary for   In-house Referral: Clinical Social Work   Post Acute Care Choice: [place]                               Social Determinants of Health (SDOH) Interventions [place]: No Food Insecurity (02/06/2022)  Recent Concern: [place] Present (11/19/2021)  Housing: Low Risk  (02/06/2022)  Transportation Needs: Unmet Transportation Needs (02/06/2022)  Utilities: Not At Risk (02/06/2022)  Depression (PHQ2-9): Low Risk  (11/19/2021)  Financial Resource Strain: High Risk (12/18/2016)  Physical Activity: Inactive (12/18/2016)  Social Connections: Moderately Integrated (02/05/2021)  Stress: Stress Concern Present (12/18/2016)  Tobacco Use: Medium Risk (02/18/2022)     Readmission Risk Interventions    01/10/2021    9:47 AM  Readmission Risk Prevention Plan  Transportation Screening Complete  PCP or Specialist Appt within 3-5 Days Complete  HRI or [place] Complete  Social Work Consult for [place] Planning/Counseling Complete  Palliative Care Screening Not Applicable  Medication Review Human resources officer) Complete

## 2022-02-20 ENCOUNTER — Encounter (HOSPITAL_COMMUNITY): Payer: Self-pay

## 2022-02-20 DIAGNOSIS — E785 Hyperlipidemia, unspecified: Secondary | ICD-10-CM | POA: Diagnosis not present

## 2022-02-20 DIAGNOSIS — I10 Essential (primary) hypertension: Secondary | ICD-10-CM | POA: Diagnosis not present

## 2022-02-20 DIAGNOSIS — M84359A Stress fracture, hip, unspecified, initial encounter for fracture: Secondary | ICD-10-CM | POA: Diagnosis not present

## 2022-02-21 NOTE — Progress Notes (Incomplete)
ADVANCED HF CLINIC NOTE  Patient ID: [name], female   DOB: [birth year], 77 y.o.   MRN: [id number]  Primary Cardiologist: Dr. Sanda Klein Nephrology: Dr. Moshe Cipro Primary HF: Dr. Haroldine Laws   HPI: [name] is a 77 y.o. with history of morbid obesity, DM, HTN, asthma, OSA, ICD, GERD, arthritis, CRT-D 2008 [employer], CAD, S/P CABG x5 11/29/2014.  Discharged from [facility] 12/07/14 s/p CABG. Discharge weight 252.  Went to [facility] SNF.  Admitted 01/07/21  A/C HFpEF. HS  Diuresed with IV lasix and transitioned to torsemide 40 mg daily. Bisoprolol was increased to 10 mg daily.   Admitted 09/09/21 after a fall. Creatinine on admit 4.9. Had T9-T10  vertebral fracture. T7-T11 pedicle screw placement 8/1. Diuretics changed to lasix 80 mg twice a day due elevated creatinine. Creatinine at d/c 2.57.   Today she returns for HF follow up. Complaining of leg edema and back pain. SOB with exertion.  Denies PND/Orthopnea. Using 3 liters [name]. Walking on when she has someone at home. Using walker. Appetite ok. No fever or chills. Weight at home  193  pounds. Taking all medications. Has HH setting up her pill box. Has transportation through [name].   NO SGLT2I due to UTI/yeast infections.   Cardiac Studies: - Echo (8/22): EF 55-60%, grade I DD, RV ok  - Cath 10/09/16 1. Severe 3v CAD 2. LIMA  to LAD widely patent 3. SVG to Diagonal widely patent 4. SVG to RCA widely patent 5. SVG to LCx and OM occluded ostially.  Underwent to stent to LCX and Ramus 6. LVEF 60% 7. Mild PAH with normal PCWP and output  - Underwent PCI 10/31/16:  -Successful PTCA/DES x 3 AV groove circumflex -Successful PTCA/DES x 1 intermediate branch   Findings:   Ao = 117/61 (83) LV = 126/8 RA = 11 RV = 39/10 PA = 37/16 (25) PCW = 13 Fick cardiac output/index = 5.1/2.3 PVR = 2.4 WU FA sat = 95% PA sat = 65%   Assessment: 1. Severe 3v CAD 2. LIMA  to LAD widely patent 3. SVG to Diagonal widely  patent 4. SVG to RCA widely patent 5. SVG to LCx and OM occluded ostially 6. LVEF 60% 7. Mild PAH with normal PCWP and output    - Echo 11/24/14 EF 55-60%, Grade 1 DD. Mild TR - TEE 11/29/14 EF 55-60%, LVH, Mild AR and MR - Echo 9/17: EF 55-60% LVH Past Medical History:  Diagnosis Date   AICD (automatic cardioverter/defibrillator) present    downgraded to CRT-P in 2014   Anemia    Arthritis    Asthma    Atrial fibrillation ([name]   Questionable history of A Fib/A Flutter. On device interrogation on 9/7, showed 0.67mn of A Fib/A Flutter with 1% burden.  Device check in Jan 2018 showed no sustained AF (runs of less than 30 seconds). No anticoagulation indicated.   CAD (coronary artery disease)    a. 10/09/16 LHC: SVG->LAD patent, SVG->Diag patent, SVG->RCA patent, SVG->LCx occluded. EF 60%, b. 10/31/16 LHC DES to AV groove Circ, DES to intermed branch   Chronic lower back pain    Fatty liver disease, nonalcoholic    GERD (gastroesophageal reflux disease)    Gout    H/O hiatal hernia    High cholesterol    Hyperplastic colon polyp    Hypertension    IBS (irritable bowel syndrome)    Internal hemorrhoids    INTERNAL HEMORRHOIDS WITHOUT MENTION COMP 04/12/2007   Qualifier: Diagnosis  of  By: Olevia Perches MD, Lowella Bandy    Nonischemic cardiomyopathy ([place]) 11/22/2011   Pt responded to BiV ICD- last EF 55-60% Sept 2017   Obesity    OSA (obstructive sleep apnea)    "can't tolerate a mask", wears 2L nocturnal O2   Presence of permanent cardiac pacemaker    11/02/12 [employer] V273 INTUA PPM   Stroke [facility]) 09/2020   Type II diabetes mellitus (HCC)    Current Outpatient Medications  Medication Sig Dispense Refill   ACCU-CHEK AVIVA PLUS test strip Check blood sugar twice daily     Accu-Chek Softclix Lancets lancets Check blood sugar twice daily     acetaminophen (TYLENOL) 500 MG tablet Take 1,000 mg by mouth 3 (three) times daily as needed (for pain or headaches).     albuterol  (PROVENTIL HFA;VENTOLIN HFA) 108 (90 Base) MCG/ACT inhaler Inhale 2 puffs into the lungs every 6 (six) hours as needed for wheezing or shortness of breath. 1 Inhaler 0   albuterol (PROVENTIL) (2.5 MG/3ML) 0.083% nebulizer solution Take 3 mLs (2.5 mg total) by nebulization every 6 (six) hours as needed for wheezing or shortness of breath. 75 mL 0   Alcohol Swabs (B-D SINGLE USE SWABS REGULAR) PADS      allopurinol (ZYLOPRIM) 100 MG tablet Take 1 tablet (100 mg total) by mouth daily. Please contact the office to schedule appointment for additional refills. 1st Attempt. 30 tablet 0   aspirin EC 81 MG tablet Take 1 tablet (81 mg total) by mouth daily. 90 tablet 3   Blood Glucose Monitoring Suppl (ACCU-CHEK GUIDE) w/Device KIT      budesonide-formoterol (SYMBICORT) 160-4.5 MCG/ACT inhaler Inhale 1 puff into the lungs 2 (two) times daily.     carboxymethylcellulose (REFRESH PLUS) 0.5 % SOLN Place 1 drop into both eyes 3 (three) times daily as needed (dry eyes).     clopidogrel (PLAVIX) 75 MG tablet Take 1 tablet (75 mg total) by mouth daily. 90 tablet 3   cycloSPORINE (RESTASIS) 0.05 % ophthalmic emulsion Place 1 drop into both eyes 2 (two) times daily.     EPINEPHrine 0.3 mg/0.3 mL IJ SOAJ injection Inject 0.3 mg into the muscle once as needed for anaphylaxis (severe allergic reaction).     ferrous sulfate 325 (65 FE) MG tablet Take 1 tablet (325 mg total) by mouth daily with breakfast. 30 tablet 0   fluticasone (FLONASE) 50 MCG/ACT nasal spray Place 1 spray into both nostrils daily as needed for allergies.     folic acid (FOLVITE) 1 MG tablet Take 1 tablet (1 mg total) by mouth daily. 30 tablet 0   furosemide (LASIX) 80 MG tablet Take 1 tablet (80 mg total) by mouth as needed for edema or fluid. 1 tablet 0   hydrOXYzine (ATARAX) 25 MG tablet Take 25 mg by mouth 2 (two) times daily as needed for itching.     isosorbide mononitrate (IMDUR) 30 MG 24 hr tablet Take 3 tablets (90 mg total) by mouth daily. 270  tablet 3   Lidocaine 0.5 % AERO Place 1 spray rectally every 6 (six) hours as needed (hemorrhoids). Preparation H rapid relief lidocaine spray     LINZESS 290 MCG CAPS capsule Take 290 mcg by mouth every morning. (Patient not taking: Reported on 02/07/2022)     methocarbamol (ROBAXIN) 500 MG tablet Take 1 tablet (500 mg total) by mouth every 6 (six) hours as needed for up to 7 days for muscle spasms.     nitroGLYCERIN (NITROSTAT)  0.4 MG SL tablet Place 1 tablet (0.4 mg total) under the tongue every 5 (five) minutes as needed for chest pain. 25 tablet 3   ondansetron (ZOFRAN) 4 MG tablet Take 4 mg by mouth daily as needed for nausea or vomiting.     OVER THE COUNTER MEDICATION Apply 1 patch topically daily as needed (for pain). Lidocaine 4 %     oxyCODONE (ROXICODONE) 5 MG immediate release tablet Take 1-2 tablets (5-10 mg total) by mouth every 4 (four) hours as needed for up to 7 days for severe pain or moderate pain. 40 tablet 0   OXYGEN Inhale 2-3 L/min into the lungs continuous.     oxymetazoline (AFRIN) 0.05 % nasal spray Place 1 spray into both nostrils 2 (two) times daily as needed for congestion.     pantoprazole (PROTONIX) 40 MG tablet Take 1 tablet (40 mg total) by mouth 2 (two) times daily. 180 tablet 3   polyethylene glycol (MIRALAX / GLYCOLAX) 17 g packet Take 17 g by mouth daily as needed (constipation).     rosuvastatin (CRESTOR) 20 MG tablet Take 1 tablet (20 mg total) by mouth every evening. NEED OV. 90 tablet 3   simethicone (MYLICON) 80 MG chewable tablet Chew 80 mg by mouth 4 (four) times daily as needed for flatulence.     sucralfate (CARAFATE) 1 g tablet Take 1 tablet (1 g total) by mouth 4 (four) times daily -  with meals and at bedtime.     vitamin B-12 (CYANOCOBALAMIN) 1000 MCG tablet Take 1 tablet (1,000 mcg total) by mouth daily. 30 tablet 1   No current facility-administered medications for this visit.   Allergies  Allergen Reactions   Ivp Dye [Iodinated Contrast  Media] Shortness Of Breath    No reaction to PO contrast with non-ionic dye.06-25-2014/rsm   Shellfish Allergy Anaphylaxis   Sulfa Antibiotics Shortness Of Breath   Iodine Hives   Atorvastatin Other (See Comments)    Pt states "causes bilateral leg pain/cramps."   Benadryl [Diphenhydramine] Other (See Comments)    "THIS DRIVES ME CRAZY AND MAKES ME FEEL LIKE I AM DYING"    Colchicine Nausea And Vomiting   Doxycycline Other (See Comments)    Unknown reaction, per patient   Uloric [Febuxostat] Other (See Comments)    Unknown reaction   Cephalexin Itching and Rash   Zithromax [Azithromycin] Rash   Social History   Socioeconomic History   Marital status: Widowed    Spouse name: Zaya Kessenich- deceased   Number of children: 5   Years of education: Not on file   Highest education level: Not on file  Occupational History   Occupation: STAFF/BUFFET    Employer: [employer]  Tobacco Use   Smoking status: Former    Packs/day: 0.25    Years: 3.00    Total pack years: 0.75    Types: Cigarettes    Quit date: 02/11/1968    Years since quitting: 54.0   Smokeless tobacco: Never  Vaping Use   Vaping Use: Never used  Substance and Sexual Activity   Alcohol use: No   Drug use: No   Sexual activity: Never  Other Topics Concern   Not on file  Social History Narrative   Widowed last year. Spouse Mike Gip Sr (02) died on date January 29, 2012 05-15-22) at his home Was a [place] [employer] -[place] [employer]   She lives with her two sons, Thayer Ohm    Other sons Glyn Ade,  Maryan Puls   Social Determinants of Health   Financial Resource Strain: High Risk (12/18/2016)   Overall Financial Resource Strain (CARDIA)    Difficulty of Paying Living Expenses: Very hard  Food Insecurity: No Food Insecurity (02/06/2022)   Hunger Vital Sign    Worried About Running Out of Food in the Last Year: Never true    [place] in the Last Year: Never true  Recent Concern: Food  Insecurity - Food Insecurity Present (11/19/2021)   Hunger Vital Sign    Worried About Running Out of Food in the Last Year: Sometimes true    Ran Out of Food in the Last Year: Sometimes true  Transportation Needs: Unmet Transportation Needs (02/06/2022)   PRAPARE - Hydrologist (Medical): Yes    Lack of Transportation (Non-Medical): No  Physical Activity: Inactive (12/18/2016)   Exercise Vital Sign    Days of Exercise per Week: 0 days    Minutes of Exercise per Session: 0 min  Stress: Stress Concern Present (12/18/2016)   [place]    Feeling of Stress : Very much  Social Connections: Moderately Integrated (02/05/2021)   Social Connection and Isolation Panel [NHANES]    Frequency of Communication with Friends and Family: Three times a week    Frequency of Social Gatherings with Friends and Family: Three times a week    Attends Religious Services: 1 to 4 times per year    Active Member of Clubs or Organizations: Yes    Attends Archivist Meetings: 1 to 4 times per year    Marital Status: Widowed  Intimate Partner Violence: Not At Risk (02/06/2022)   Humiliation, Afraid, Rape, and Kick questionnaire    Fear of Current or Ex-Partner: No    Emotionally Abused: No    Physically Abused: No    Sexually Abused: No   Family History  Problem Relation Age of Onset   Breast cancer Mother    Diabetes Mother    Asthma Sister    Heart disease Maternal Grandmother    Kidney disease Maternal Grandmother    Diabetes Maternal Grandmother    Glaucoma Maternal Aunt    Heart disease Maternal Aunt    Colon cancer Neg Hx    Stomach cancer Neg Hx    Pancreatic cancer Neg Hx    There were no vitals taken for this visit.  Wt Readings from Last 3 Encounters:  02/12/22 85.7 kg (189 lb)  01/13/22 89.4 kg (197 lb)  11/05/21 98 kg (216 lb)    PHYSICAL EXAM: General:  In the wheel chair. On 3  liters [place].  HEENT: normal Neck: supple. no JVD. Carotids 2+ bilat; no bruits. No lymphadenopathy or thryomegaly appreciated. Cor: PMI nondisplaced. Regular rate & rhythm. No rubs, gallops or murmurs. Lungs: clear Abdomen: soft, nontender, nondistended. No hepatosplenomegaly. No bruits or masses. Good bowel sounds. Extremities: no cyanosis, clubbing, rash, R and LLE 2+ edema Neuro: alert & orientedx3, cranial nerves grossly intact. moves all 4 extremities w/o difficulty. Affect pleasant  ASSESSMENT & PLAN: 1. Chronic systolic/diastolic HF  - Echo 9/73 EF 55-60%, mild LVH.  - RHC in 8/18 looked good. - Echo 12/20 EF down to 40-45% in setting of loss of LV pacing which has now been restored - Echo 4/21 EF 50-55%--->09/2020 EF 55-60% RV normal.  - NYHA III.- Mildly volume overloaded. Continue lasix 80 mg twice a day . Add 2.5 mg metolazone  as needed. Continue compression stockings - Stop hydralazine/metoprolol xl with low BP  - Wrap legs with unna boot via HHRN   2. CAD s/p CABG 11/2014 - s/p LCX stents in 2018. - On statin.  - No chest pain.   3. Morbid obesity - There is no height or weight on file to calculate BMI. Unable to stand  - Consider referral to pharmacy for semaglutide.  4. H/o heart block s/p BiV pacer/CRT-D [employer] - Had gen change out 12/31/20 by Dr Orene Desanctis.  5. CKD IIIb - SCr baseline unclear baseline. At d/c creatinine 2.6  - Encouraged her to follow up with Nephrology. - Check BMET today  6. OSA - Says she can't tolerate CPAP.  7. Chronic respiratory failure - Likely OSA/OHS - Continue O2 3 liters [place]. Sats stable.   8. DM2 - A1c 6.5 11/22 - No SGLT2i with UTI/yeast infections.   Rafael Bihari, FNP   4:20 PM

## 2022-02-24 DIAGNOSIS — I5033 Acute on chronic diastolic (congestive) heart failure: Secondary | ICD-10-CM | POA: Diagnosis not present

## 2022-02-25 ENCOUNTER — Encounter (HOSPITAL_COMMUNITY): Payer: Medicare Other

## 2022-02-25 DIAGNOSIS — M84359A Stress fracture, hip, unspecified, initial encounter for fracture: Secondary | ICD-10-CM | POA: Diagnosis not present

## 2022-02-25 DIAGNOSIS — S72112D Displaced fracture of greater trochanter of left femur, subsequent encounter for closed fracture with routine healing: Secondary | ICD-10-CM | POA: Diagnosis not present

## 2022-02-26 ENCOUNTER — Encounter (HOSPITAL_COMMUNITY): Payer: Medicare Other

## 2022-02-26 DIAGNOSIS — J449 Chronic obstructive pulmonary disease, unspecified: Secondary | ICD-10-CM | POA: Diagnosis not present

## 2022-02-26 DIAGNOSIS — R2681 Unsteadiness on feet: Secondary | ICD-10-CM | POA: Diagnosis not present

## 2022-02-26 DIAGNOSIS — M6281 Muscle weakness (generalized): Secondary | ICD-10-CM | POA: Diagnosis not present

## 2022-02-26 DIAGNOSIS — M84359A Stress fracture, hip, unspecified, initial encounter for fracture: Secondary | ICD-10-CM | POA: Diagnosis not present

## 2022-02-26 DIAGNOSIS — Z4789 Encounter for other orthopedic aftercare: Secondary | ICD-10-CM | POA: Diagnosis not present

## 2022-02-26 DIAGNOSIS — J9611 Chronic respiratory failure with hypoxia: Secondary | ICD-10-CM | POA: Diagnosis not present

## 2022-02-27 ENCOUNTER — Encounter (HOSPITAL_COMMUNITY): Payer: Self-pay

## 2022-02-27 ENCOUNTER — Emergency Department (HOSPITAL_COMMUNITY)
Admission: EM | Admit: 2022-02-27 | Discharge: 2022-02-27 | Disposition: A | Discharge status: Rehabilitation Facility | Payer: 59 | Attending: Emergency Medicine | Admitting: Emergency Medicine

## 2022-02-27 ENCOUNTER — Other Ambulatory Visit: Payer: Self-pay

## 2022-02-27 ENCOUNTER — Emergency Department (HOSPITAL_COMMUNITY): Payer: 59

## 2022-02-27 ENCOUNTER — Emergency Department (HOSPITAL_BASED_OUTPATIENT_CLINIC_OR_DEPARTMENT_OTHER): Payer: 59

## 2022-02-27 DIAGNOSIS — R040 Epistaxis: Secondary | ICD-10-CM

## 2022-02-27 DIAGNOSIS — J45909 Unspecified asthma, uncomplicated: Secondary | ICD-10-CM | POA: Diagnosis not present

## 2022-02-27 DIAGNOSIS — M7989 Other specified soft tissue disorders: Secondary | ICD-10-CM | POA: Diagnosis not present

## 2022-02-27 DIAGNOSIS — I10 Essential (primary) hypertension: Secondary | ICD-10-CM | POA: Insufficient documentation

## 2022-02-27 DIAGNOSIS — R042 Hemoptysis: Secondary | ICD-10-CM | POA: Diagnosis not present

## 2022-02-27 DIAGNOSIS — Z79899 Other long term (current) drug therapy: Secondary | ICD-10-CM | POA: Insufficient documentation

## 2022-02-27 DIAGNOSIS — Z7982 Long term (current) use of aspirin: Secondary | ICD-10-CM | POA: Diagnosis not present

## 2022-02-27 DIAGNOSIS — I2510 Atherosclerotic heart disease of native coronary artery without angina pectoris: Secondary | ICD-10-CM | POA: Insufficient documentation

## 2022-02-27 DIAGNOSIS — E119 Type 2 diabetes mellitus without complications: Secondary | ICD-10-CM | POA: Insufficient documentation

## 2022-02-27 DIAGNOSIS — Z743 Need for continuous supervision: Secondary | ICD-10-CM | POA: Diagnosis not present

## 2022-02-27 DIAGNOSIS — Z7951 Long term (current) use of inhaled steroids: Secondary | ICD-10-CM | POA: Diagnosis not present

## 2022-02-27 DIAGNOSIS — R6889 Other general symptoms and signs: Secondary | ICD-10-CM | POA: Diagnosis not present

## 2022-02-27 DIAGNOSIS — K209 Esophagitis, unspecified without bleeding: Secondary | ICD-10-CM | POA: Diagnosis not present

## 2022-02-27 DIAGNOSIS — Z7401 Bed confinement status: Secondary | ICD-10-CM | POA: Diagnosis not present

## 2022-02-27 DIAGNOSIS — Z7902 Long term (current) use of antithrombotics/antiplatelets: Secondary | ICD-10-CM | POA: Insufficient documentation

## 2022-02-27 DIAGNOSIS — R531 Weakness: Secondary | ICD-10-CM | POA: Diagnosis not present

## 2022-02-27 LAB — CBC
HCT: 26.9 % — ABNORMAL LOW (ref 36.0–46.0)
Hemoglobin: 8.1 g/dL — ABNORMAL LOW (ref 12.0–15.0)
MCH: 29.5 pg (ref 26.0–34.0)
MCHC: 30.1 g/dL (ref 30.0–36.0)
MCV: 97.8 fL (ref 80.0–100.0)
Platelets: 242 10*3/uL (ref 150–400)
RBC: 2.75 MIL/uL — ABNORMAL LOW (ref 3.87–5.11)
RDW: 14.6 % (ref 11.5–15.5)
WBC: 8.8 10*3/uL (ref 4.0–10.5)
nRBC: 0 % (ref 0.0–0.2)

## 2022-02-27 LAB — BASIC METABOLIC PANEL
Anion gap: 8 (ref 5–15)
BUN: 39 mg/dL — ABNORMAL HIGH (ref 8–23)
CO2: 30 mmol/L (ref 22–32)
Calcium: 9.4 mg/dL (ref 8.9–10.3)
Chloride: 103 mmol/L (ref 98–111)
Creatinine, Ser: 2.18 mg/dL — ABNORMAL HIGH (ref 0.44–1.00)
GFR, Estimated: 23 mL/min — ABNORMAL LOW (ref >60)
Glucose, Bld: 116 mg/dL — ABNORMAL HIGH (ref 70–99)
Potassium: 3.3 mmol/L — ABNORMAL LOW (ref 3.5–5.1)
Sodium: 141 mmol/L (ref 135–145)

## 2022-02-27 MED ORDER — POTASSIUM CHLORIDE CRYS ER 20 MEQ PO TBCR
40.0000 meq | EXTENDED_RELEASE_TABLET | Freq: Once | ORAL | Status: AC
  Administered 2022-02-27: 40 meq via ORAL
  Filled 2022-02-27: qty 2

## 2022-02-27 MED ORDER — IPRATROPIUM-ALBUTEROL 0.5-2.5 (3) MG/3ML IN SOLN
3.0000 mL | Freq: Once | RESPIRATORY_TRACT | Status: AC
  Administered 2022-02-27: 3 mL via RESPIRATORY_TRACT
  Filled 2022-02-27: qty 3

## 2022-02-27 MED ORDER — OXYMETAZOLINE HCL 0.05 % NA SOLN
1.0000 | Freq: Once | NASAL | Status: AC
  Administered 2022-02-27: 1 via NASAL
  Filled 2022-02-27: qty 30

## 2022-02-27 MED ORDER — FUROSEMIDE 20 MG PO TABS
40.0000 mg | ORAL_TABLET | Freq: Once | ORAL | Status: AC
  Administered 2022-02-27: 40 mg via ORAL
  Filled 2022-02-27: qty 2

## 2022-02-27 NOTE — Discharge Instructions (Signed)
You were seen for nosebleed and episode of coughing up blood.  Take an extra half dose of your Lasix for the next 3 days.  If you have a repeat nosebleed, blow the blood out and use the Afrin 2 sprays in each nose and hold pressure for 15 minutes. If you continue to have bleeding after this come back to the ER to be evaluated.  Follow-up with your primary care doctor regarding your visit to the ER today.  There was no evidence of blood clot in your leg.  There was no pneumonia on your chest x-ray.    If you continue to have worsening chest pain, shortness of breath or coughing up more blood come back to the ER.

## 2022-02-27 NOTE — ED Provider Notes (Signed)
[facility] EMERGENCY DEPARTMENT Provider Note   CSN: [id number] Arrival date & time: 02/27/22  1518     History  Chief Complaint  Patient presents with   Epistaxis   Hemoptysis    [name] is a 77 y.o. female.   Epistaxis    Patient has history of asthma hypertension coronary artery disease, reflux, fatty liver disease, hypercholesterolemia, obstructive sleep apnea, diabetes, atrial fibrillation, pacemaker, chronic oxygen use.  Patient was also recently in the hospital for hip replacement surgery about a week ago.  Patient states she was at the nursing facility today when she had a nosebleed.  Patient states it was very brisk this morning but resolved spontaneously.  Patient states later in the afternoon she felt like she had some blood in the back of her throat and coughed up blood with clots in it.  Patient has not had any further episodes since.  She denies feeling short of breath right now.  She was sent to the ED for further evaluation.  Plavix but not on any other blood thinning medications.  Home Medications Prior to Admission medications   Medication Sig Start Date End Date Taking? Authorizing Provider  ACCU-CHEK AVIVA PLUS test strip Check blood sugar twice daily 10/19/19   [provider]  Accu-Chek Softclix Lancets lancets Check blood sugar twice daily 08/10/19   [provider]  acetaminophen (TYLENOL) 500 MG tablet Take 1,000 mg by mouth 3 (three) times daily as needed (for pain or headaches).    [provider]  albuterol (PROVENTIL HFA;VENTOLIN HFA) 108 (90 Base) MCG/ACT inhaler Inhale 2 puffs into the lungs every 6 (six) hours as needed for wheezing or shortness of breath. 05/02/18   Norval Morton, MD  albuterol (PROVENTIL) (2.5 MG/3ML) 0.083% nebulizer solution Take 3 mLs (2.5 mg total) by nebulization every 6 (six) hours as needed for wheezing or shortness of breath. 05/02/18   Norval Morton, MD  Alcohol Swabs  (B-D SINGLE USE SWABS REGULAR) PADS  08/10/19   [provider]  allopurinol (ZYLOPRIM) 100 MG tablet Take 1 tablet (100 mg total) by mouth daily. Please contact the office to schedule appointment for additional refills. 1st Attempt. 12/05/21   Croitoru, [name], MD  aspirin EC 81 MG tablet Take 1 tablet (81 mg total) by mouth daily. 03/27/16   Erlene Quan, PA-C  Blood Glucose Monitoring Suppl (ACCU-CHEK GUIDE) w/Device KIT  06/26/21   [provider]  budesonide-formoterol (SYMBICORT) 160-4.5 MCG/ACT inhaler Inhale 1 puff into the lungs 2 (two) times daily.    [provider]  carboxymethylcellulose (REFRESH PLUS) 0.5 % SOLN Place 1 drop into both eyes 3 (three) times daily as needed (dry eyes).    [provider]  clopidogrel (PLAVIX) 75 MG tablet Take 1 tablet (75 mg total) by mouth daily. 10/23/21   Croitoru, [name], MD  cycloSPORINE (RESTASIS) 0.05 % ophthalmic emulsion Place 1 drop into both eyes 2 (two) times daily.    [provider]  EPINEPHrine 0.3 mg/0.3 mL IJ SOAJ injection Inject 0.3 mg into the muscle once as needed for anaphylaxis (severe allergic reaction).    [provider]  ferrous sulfate 325 (65 FE) MG tablet Take 1 tablet (325 mg total) by mouth daily with breakfast. 02/19/22 03/21/22  Antonieta Pert, MD  fluticasone (FLONASE) 50 MCG/ACT nasal spray Place 1 spray into both nostrils daily as needed for allergies.    [provider]  folic acid (FOLVITE) 1 MG tablet  Take 1 tablet (1 mg total) by mouth daily. 02/19/22 03/21/22  Antonieta Pert, MD  furosemide (LASIX) 80 MG tablet Take 1 tablet (80 mg total) by mouth as needed for edema or fluid. 02/19/22   Antonieta Pert, MD  hydrOXYzine (ATARAX) 25 MG tablet Take 25 mg by mouth 2 (two) times daily as needed for itching. 06/07/21   [provider]  isosorbide mononitrate (IMDUR) 30 MG 24 hr tablet Take 3 tablets (90 mg total) by mouth daily. 10/23/21   Croitoru, [name], MD  Lidocaine 0.5  % AERO Place 1 spray rectally every 6 (six) hours as needed (hemorrhoids). Preparation H rapid relief lidocaine spray    [provider]  LINZESS 290 MCG CAPS capsule Take 290 mcg by mouth every morning. Patient not taking: Reported on 02/07/2022 08/17/21   [provider]  nitroGLYCERIN (NITROSTAT) 0.4 MG SL tablet Place 1 tablet (0.4 mg total) under the tongue every 5 (five) minutes as needed for chest pain. 11/13/16 03/18/22  Erlene Quan, PA-C  ondansetron (ZOFRAN) 4 MG tablet Take 4 mg by mouth daily as needed for nausea or vomiting. 07/24/21   [provider]  OVER THE COUNTER MEDICATION Apply 1 patch topically daily as needed (for pain). Lidocaine 4 %    [provider]  OXYGEN Inhale 2-3 L/min into the lungs continuous.    [provider]  oxymetazoline (AFRIN) 0.05 % nasal spray Place 1 spray into both nostrils 2 (two) times daily as needed for congestion.    [provider]  pantoprazole (PROTONIX) 40 MG tablet Take 1 tablet (40 mg total) by mouth 2 (two) times daily. 07/08/19   [name], MD  polyethylene glycol (MIRALAX / GLYCOLAX) 17 g packet Take 17 g by mouth daily as needed (constipation).    [provider]  rosuvastatin (CRESTOR) 20 MG tablet Take 1 tablet (20 mg total) by mouth every evening. NEED OV. 10/23/21   Croitoru, Dani Gobble, MD  simethicone (MYLICON) 80 MG chewable tablet Chew 80 mg by mouth 4 (four) times daily as needed for flatulence.    [provider]  sucralfate (CARAFATE) 1 g tablet Take 1 tablet (1 g total) by mouth 4 (four) times daily -  with meals and at bedtime. 02/19/22   Antonieta Pert, MD  vitamin B-12 (CYANOCOBALAMIN) 1000 MCG tablet Take 1 tablet (1,000 mcg total) by mouth daily. 08/15/21   Caren Griffins, MD      Allergies    Ivp dye [iodinated contrast media], Shellfish allergy, Sulfa antibiotics, Iodine, Atorvastatin, Benadryl [diphenhydramine], Colchicine, Doxycycline, Uloric  [febuxostat], Cephalexin, and Zithromax [azithromycin]    Review of Systems   Review of Systems  HENT:  Positive for nosebleeds.     Physical Exam Updated Vital Signs BP 116/84 (BP Location: Right Arm)   Pulse 88   Temp 97.8 F (36.6 C) (Oral)   Resp 18   Ht 1.6 m ('5\' 3"'$ )   Wt 85 kg   SpO2 100%   BMI 33.19 kg/m  Physical Exam Vitals and nursing note reviewed.  Constitutional:      Appearance: She is well-developed. She is not diaphoretic.  HENT:     Head: Normocephalic and atraumatic.     Right Ear: External ear normal.     Left Ear: External ear normal.     Nose:     Comments: Dried blood noted right nares, no active bleeding, no clots Eyes:     General: No scleral icterus.  Right eye: No discharge.        Left eye: No discharge.     Conjunctiva/sclera: Conjunctivae normal.  Neck:     Trachea: No tracheal deviation.  Cardiovascular:     Rate and Rhythm: Normal rate and regular rhythm.  Pulmonary:     Effort: Pulmonary effort is normal. No respiratory distress.     Breath sounds: Normal breath sounds. No stridor. No wheezing or rales.  Abdominal:     General: Bowel sounds are normal. There is no distension.     Palpations: Abdomen is soft.     Tenderness: There is no abdominal tenderness. There is no guarding or rebound.  Musculoskeletal:        General: No tenderness or deformity.     Cervical back: Neck supple.     Right lower leg: Edema present.     Left lower leg: Edema present.  Skin:    General: Skin is warm and dry.     Findings: No rash.  Neurological:     General: No focal deficit present.     Mental Status: She is alert.     Cranial Nerves: No cranial nerve deficit, dysarthria or facial asymmetry.     Sensory: No sensory deficit.     Motor: No abnormal muscle tone or seizure activity.     Coordination: Coordination normal.  Psychiatric:        Mood and Affect: Mood normal.     ED Results / Procedures / Treatments   Labs (all labs  ordered are listed, but only abnormal results are displayed) Labs Reviewed  CBC  BASIC METABOLIC PANEL    EKG None  Radiology No results found.  Procedures Procedures    Medications Ordered in ED Medications - No data to display  ED Course/ Medical Decision Making/ A&P                             Medical Decision Making Differential diagnosis includes but not limited to hemoptysis related to recent nosebleed, pneumonia, bronchiectasis  Amount and/or Complexity of Data Reviewed Labs: ordered. Radiology: ordered.   Patient presents hemoptysis after recent episode of a nosebleed.  No signs of any active nosebleed at this time.  Patient is on her baseline nasal cannula oxygen.  She is not having any active bleeding.  With her comorbidities we will plan on checking CBC metabolic panel.  Will obtain a chest x-ray.  Her hemoptysis is most likely related to her bout of epistaxis earlier but will continue to monitor closely for any recurrence.  Labs and xray pending at shift change.       Final Clinical Impression(s) / ED Diagnoses pending     Dorie Rank, MD 02/27/22 1549

## 2022-02-27 NOTE — Progress Notes (Signed)
Lower extremity venous left study completed.  Preliminary results relayed to Nechama Guard, MD.   See CV Proc for preliminary results report.   Darlin Coco, RDMS, RVT

## 2022-02-27 NOTE — ED Notes (Signed)
Patient transported to X-ray 

## 2022-02-27 NOTE — ED Provider Notes (Signed)
  Physical Exam  BP 118/63   Pulse 89   Temp 97.8 F (36.6 C) (Oral)   Resp 18   Ht '5\' 3"'$  (1.6 m)   Wt 85 kg   SpO2 100%   BMI 33.19 kg/m   Physical Exam Constitutional:      Comments: Chronically ill-appearing but no acute distress  Cardiovascular:     Rate and Rhythm: Normal rate.  Pulmonary:     Effort: Pulmonary effort is normal.     Comments: Faint wheeze with good aeration throughout, 100% on home 3 L nasal cannula    Procedures  Procedures  ED Course / MDM   Clinical Course as of 02/27/22 1856  Thu Feb 27, 2022  1606 76 F with asthma on 3L O2 [place] at baseline, CAD on Plavix, obesity presented with 1 episode small volume hemoptysis after epistaxis earlier today. She is in no respiratory distress. Epistaxis resolved. Follow up labs and CXR. Reassess. [VB]  [id number] Patient breathing comfortably on her home 3 L nasal cannula.  Her chest x-ray showed no pneumonia but did show evidence of central vascular congestion.  She has been off her diuresis for the past couple of days.  I have given her an extra half dose today as she took full dose earlier.  She did have a DVT scan performed as she did have increased pitting edema of her left lower extremity although this is the postop leg and likely reactive in nature however negative DVT scan.  With negative DVT scan doubt PE.  Hemoptysis likely secondary to swallowed blood from epistaxis today.  Her hemoglobin is stable 8.1 not requiring transfusion.  Platelets normal.  No concern for coagulopathy.  Creatinine 2.18 improved from baseline.  Plan to send patient home with Afrin and strict return precautions. [VB]    Clinical Course User Index [VB] Elgie Congo, MD   Medical Decision Making Amount and/or Complexity of Data Reviewed Labs: ordered. Radiology: ordered.  Risk OTC drugs. Prescription drug management.          Elgie Congo, MD 02/27/22 [phone]

## 2022-02-27 NOTE — ED Notes (Signed)
Patients changed into a clean brief and repositioned in the bed.

## 2022-02-27 NOTE — ED Notes (Signed)
Vascular Ultrasound at bedside.

## 2022-02-27 NOTE — ED Triage Notes (Signed)
Patient presented to the ED with [facility] from [place]. Staff at [facility] wants her to get evaluated after she had a nose bleed this morning that resolved quickly and then around 2:15 this after noon coughed up bright red blood with clots in it. She had a hip replacement about a week ago. [facility] reports VSS, CBG 217 and is on 3 liters of oxygen at baseline.

## 2022-02-28 DIAGNOSIS — K209 Esophagitis, unspecified without bleeding: Secondary | ICD-10-CM | POA: Diagnosis not present

## 2022-03-03 DIAGNOSIS — D631 Anemia in chronic kidney disease: Secondary | ICD-10-CM | POA: Diagnosis not present

## 2022-03-03 DIAGNOSIS — N2581 Secondary hyperparathyroidism of renal origin: Secondary | ICD-10-CM | POA: Diagnosis not present

## 2022-03-03 DIAGNOSIS — I129 Hypertensive chronic kidney disease with stage 1 through stage 4 chronic kidney disease, or unspecified chronic kidney disease: Secondary | ICD-10-CM | POA: Diagnosis not present

## 2022-03-03 DIAGNOSIS — E1122 Type 2 diabetes mellitus with diabetic chronic kidney disease: Secondary | ICD-10-CM | POA: Diagnosis not present

## 2022-03-03 DIAGNOSIS — N184 Chronic kidney disease, stage 4 (severe): Secondary | ICD-10-CM | POA: Diagnosis not present

## 2022-03-03 DIAGNOSIS — I509 Heart failure, unspecified: Secondary | ICD-10-CM | POA: Diagnosis not present

## 2022-03-04 ENCOUNTER — Emergency Department (HOSPITAL_COMMUNITY): Payer: 59

## 2022-03-04 ENCOUNTER — Emergency Department (HOSPITAL_COMMUNITY)
Admission: EM | Admit: 2022-03-04 | Discharge: 2022-03-04 | Disposition: A | Discharge status: Skilled Nursing Facility | Payer: 59 | Attending: Emergency Medicine | Admitting: Emergency Medicine

## 2022-03-04 ENCOUNTER — Encounter (HOSPITAL_COMMUNITY): Payer: Self-pay

## 2022-03-04 DIAGNOSIS — Z7902 Long term (current) use of antithrombotics/antiplatelets: Secondary | ICD-10-CM | POA: Insufficient documentation

## 2022-03-04 DIAGNOSIS — D649 Anemia, unspecified: Secondary | ICD-10-CM | POA: Diagnosis not present

## 2022-03-04 DIAGNOSIS — J45909 Unspecified asthma, uncomplicated: Secondary | ICD-10-CM | POA: Insufficient documentation

## 2022-03-04 DIAGNOSIS — Z7982 Long term (current) use of aspirin: Secondary | ICD-10-CM | POA: Diagnosis not present

## 2022-03-04 DIAGNOSIS — W19XXXA Unspecified fall, initial encounter: Secondary | ICD-10-CM | POA: Diagnosis not present

## 2022-03-04 DIAGNOSIS — N289 Disorder of kidney and ureter, unspecified: Secondary | ICD-10-CM | POA: Diagnosis not present

## 2022-03-04 DIAGNOSIS — I110 Hypertensive heart disease with heart failure: Secondary | ICD-10-CM | POA: Diagnosis not present

## 2022-03-04 DIAGNOSIS — I509 Heart failure, unspecified: Secondary | ICD-10-CM | POA: Insufficient documentation

## 2022-03-04 DIAGNOSIS — I2510 Atherosclerotic heart disease of native coronary artery without angina pectoris: Secondary | ICD-10-CM | POA: Diagnosis not present

## 2022-03-04 DIAGNOSIS — Z79899 Other long term (current) drug therapy: Secondary | ICD-10-CM | POA: Diagnosis not present

## 2022-03-04 DIAGNOSIS — Z743 Need for continuous supervision: Secondary | ICD-10-CM | POA: Diagnosis not present

## 2022-03-04 DIAGNOSIS — R7989 Other specified abnormal findings of blood chemistry: Secondary | ICD-10-CM

## 2022-03-04 DIAGNOSIS — Z7951 Long term (current) use of inhaled steroids: Secondary | ICD-10-CM | POA: Diagnosis not present

## 2022-03-04 DIAGNOSIS — Z950 Presence of cardiac pacemaker: Secondary | ICD-10-CM | POA: Insufficient documentation

## 2022-03-04 DIAGNOSIS — E119 Type 2 diabetes mellitus without complications: Secondary | ICD-10-CM | POA: Diagnosis not present

## 2022-03-04 DIAGNOSIS — E876 Hypokalemia: Secondary | ICD-10-CM | POA: Insufficient documentation

## 2022-03-04 DIAGNOSIS — I10 Essential (primary) hypertension: Secondary | ICD-10-CM

## 2022-03-04 DIAGNOSIS — R0602 Shortness of breath: Secondary | ICD-10-CM | POA: Diagnosis not present

## 2022-03-04 LAB — CBC WITH DIFFERENTIAL/PLATELET
Abs Immature Granulocytes: 0.03 10*3/uL (ref 0.00–0.07)
Basophils Absolute: 0.1 10*3/uL (ref 0.0–0.1)
Basophils Relative: 1 %
Eosinophils Absolute: 0.4 10*3/uL (ref 0.0–0.5)
Eosinophils Relative: 6 %
HCT: 28 % — ABNORMAL LOW (ref 36.0–46.0)
Hemoglobin: 8.4 g/dL — ABNORMAL LOW (ref 12.0–15.0)
Immature Granulocytes: 0 %
Lymphocytes Relative: 16 %
Lymphs Abs: 1.1 10*3/uL (ref 0.7–4.0)
MCH: 29.2 pg (ref 26.0–34.0)
MCHC: 30 g/dL (ref 30.0–36.0)
MCV: 97.2 fL (ref 80.0–100.0)
Monocytes Absolute: 0.5 10*3/uL (ref 0.1–1.0)
Monocytes Relative: 7 %
Neutro Abs: 4.7 10*3/uL (ref 1.7–7.7)
Neutrophils Relative %: 70 %
Platelets: 179 10*3/uL (ref 150–400)
RBC: 2.88 MIL/uL — ABNORMAL LOW (ref 3.87–5.11)
RDW: 14.6 % (ref 11.5–15.5)
WBC: 6.7 10*3/uL (ref 4.0–10.5)
nRBC: 0 % (ref 0.0–0.2)

## 2022-03-04 LAB — BASIC METABOLIC PANEL
Anion gap: 10 (ref 5–15)
BUN: 29 mg/dL — ABNORMAL HIGH (ref 8–23)
CO2: 32 mmol/L (ref 22–32)
Calcium: 9.7 mg/dL (ref 8.9–10.3)
Chloride: 99 mmol/L (ref 98–111)
Creatinine, Ser: 1.85 mg/dL — ABNORMAL HIGH (ref 0.44–1.00)
GFR, Estimated: 28 mL/min — ABNORMAL LOW (ref >60)
Glucose, Bld: 130 mg/dL — ABNORMAL HIGH (ref 70–99)
Potassium: 3 mmol/L — ABNORMAL LOW (ref 3.5–5.1)
Sodium: 141 mmol/L (ref 135–145)

## 2022-03-04 LAB — BRAIN NATRIURETIC PEPTIDE: B Natriuretic Peptide: 666.9 pg/mL — ABNORMAL HIGH (ref 0.0–100.0)

## 2022-03-04 LAB — TROPONIN I (HIGH SENSITIVITY): Troponin I (High Sensitivity): 28 ng/L — ABNORMAL HIGH (ref <18)

## 2022-03-04 MED ORDER — FUROSEMIDE 10 MG/ML IJ SOLN
40.0000 mg | Freq: Once | INTRAMUSCULAR | Status: AC
  Administered 2022-03-04: 40 mg via INTRAVENOUS
  Filled 2022-03-04: qty 4

## 2022-03-04 MED ORDER — POTASSIUM CHLORIDE CRYS ER 20 MEQ PO TBCR
40.0000 meq | EXTENDED_RELEASE_TABLET | Freq: Once | ORAL | Status: AC
  Administered 2022-03-04: 40 meq via ORAL
  Filled 2022-03-04: qty 2

## 2022-03-04 MED ORDER — POTASSIUM CHLORIDE CRYS ER 20 MEQ PO TBCR: 20.0000 meq | EXTENDED_RELEASE_TABLET | Freq: Two times a day (BID) | ORAL | 0 refills | Status: DC

## 2022-03-04 MED ORDER — FUROSEMIDE 40 MG PO TABS: 40.0000 mg | ORAL_TABLET | Freq: Two times a day (BID) | ORAL | 0 refills | Status: DC

## 2022-03-04 NOTE — ED Provider Notes (Signed)
[place] [facility] Provider Note   CSN: [id number] Arrival date & time: 03/04/22  0321     History  Chief Complaint  Patient presents with   Shortness of Breath    Pt presents to ED with sob x2-3 weeks with hx of chf and asthma with chronic respiratory failure. Pt is prescribed lasix but is unsure if she is receiving it at [facility]). Pt has previous hip fx from previous admission with injury 17 days ago.     [name] is a 77 y.o. female.  The history is provided by the patient.  Shortness of Breath She has history of hypertension, diabetes, hyperlipidemia, asthma, coronary artery disease, nonischemic cardiomyopathy, AICD/pacemaker insertion and comes in because of worsening shortness of breath over the last 2 weeks.  She states that she is supposed to be getting furosemide 40 mg twice a day, but she has only been getting furosemide 20 mg twice a day.  Breathing got worse tonight.  She denies chest pain, heaviness, tightness, pressure.  She denies fever or chills.   Home Medications Prior to Admission medications   Medication Sig Start Date End Date Taking? Authorizing Provider  acetaminophen (TYLENOL) 500 MG tablet Take 1,000 mg by mouth 3 (three) times daily as needed for headache (pain).    [provider]  albuterol (PROVENTIL HFA;VENTOLIN HFA) 108 (90 Base) MCG/ACT inhaler Inhale 2 puffs into the lungs every 6 (six) hours as needed for wheezing or shortness of breath. 05/02/18   Norval Morton, MD  albuterol (PROVENTIL) (2.5 MG/3ML) 0.083% nebulizer solution Take 3 mLs (2.5 mg total) by nebulization every 6 (six) hours as needed for wheezing or shortness of breath. 05/02/18   Norval Morton, MD  allopurinol (ZYLOPRIM) 100 MG tablet Take 1 tablet (100 mg total) by mouth daily. Please contact the office to schedule appointment for additional refills. 1st Attempt. Patient taking differently: Take 100 mg by mouth in the  morning. (0900) 12/05/21   Croitoru, [name], MD  aspirin EC 81 MG tablet Take 1 tablet (81 mg total) by mouth daily. Patient taking differently: Take 81 mg by mouth in the morning. (0900) 03/27/16   Erlene Quan, PA-C  budesonide-formoterol [facility]) 160-4.5 MCG/ACT inhaler Inhale 2 puffs into the lungs 2 (two) times daily. (0900, 2100)    [provider]  Carboxymethylcellulose Sod PF (REFRESH PLUS) 0.5 % SOLN Place 1 drop into both eyes 3 (three) times daily as needed (dry eyes).    [provider]  clopidogrel (PLAVIX) 75 MG tablet Take 1 tablet (75 mg total) by mouth daily. Patient taking differently: Take 75 mg by mouth in the morning. (0900) 10/23/21   Croitoru, Dani Gobble, MD  cycloSPORINE (RESTASIS) 0.05 % ophthalmic emulsion Place 1 drop into both eyes 2 (two) times daily. (0900, 2100)    [provider]  ferrous sulfate 325 (65 FE) MG tablet Take 1 tablet (325 mg total) by mouth daily with breakfast. Patient taking differently: Take 325 mg by mouth daily with breakfast. (0900) 02/19/22 03/21/22  Antonieta Pert, MD  fluticasone (FLONASE) 50 MCG/ACT nasal spray Place 1 spray into both nostrils daily as needed for allergies.    [provider]  folic acid (FOLVITE) 1 MG tablet Take 1 tablet (1 mg total) by mouth daily. Patient taking differently: Take 1 mg by mouth in the morning. (0900) 02/19/22 03/21/22  Antonieta Pert, MD  furosemide (LASIX) 20 MG tablet Take 20 mg by mouth 2 (  two) times daily. 5 day course. (02/26/22-03/02/22)    [provider]  furosemide (LASIX) 80 MG tablet Take 1 tablet (80 mg total) by mouth as needed for edema or fluid. Patient not taking: Reported on 02/27/2022 02/19/22   Antonieta Pert, MD  hydrOXYzine (ATARAX) 25 MG tablet Take 25 mg by mouth 2 (two) times daily as needed for itching.    [provider]  isosorbide mononitrate (IMDUR) 30 MG 24 hr tablet Take 3 tablets (90 mg total) by mouth daily. Patient taking differently: Take 90  mg by mouth in the morning. (0900) 10/23/21   Croitoru, [name], MD  Lido-PE-Glycerin-Petrolatum (PREPARATION H RAPID RELIEF EX) Place 1 spray rectally every 6 (six) hours as needed (hemorrhoids).    [provider]  lidocaine 4 % Place 1 patch onto the skin daily as needed (pain).    [provider]  linaclotide (LINZESS) 290 MCG CAPS capsule Take 290 mcg by mouth daily before breakfast. (0600)    [provider]  methocarbamol (ROBAXIN) 500 MG tablet Take 500 mg by mouth every 6 (six) hours as needed for muscle spasms.    [provider]  nitroGLYCERIN (NITROSTAT) 0.4 MG SL tablet Place 1 tablet (0.4 mg total) under the tongue every 5 (five) minutes as needed for chest pain. Patient taking differently: Place 0.4 mg under the tongue every 5 (five) minutes x 3 doses as needed for chest pain. 11/13/16 03/18/22  Erlene Quan, PA-C  ondansetron (ZOFRAN) 4 MG tablet Take 4 mg by mouth daily as needed for nausea or vomiting.    [provider]  oxyCODONE (OXY IR/ROXICODONE) 5 MG immediate release tablet Take 5 mg by mouth every 4 (four) hours as needed for moderate pain.    [provider]  Oxycodone HCl 10 MG TABS Take 10 mg by mouth every 4 (four) hours as needed (severe pain).    [provider]  OXYGEN Inhale 2-3 L/min into the lungs continuous.    [provider]  oxymetazoline (AFRIN) 0.05 % nasal spray Place 1 spray into both nostrils 2 (two) times daily as needed for congestion.    [provider]  pantoprazole (PROTONIX) 40 MG tablet Take 1 tablet (40 mg total) by mouth 2 (two) times daily. Patient taking differently: Take 40 mg by mouth 2 (two) times daily. (0900, 2100) 07/08/19   Nandigam, Venia Minks, MD  polyethylene glycol powder (GLYCOLAX/MIRALAX) 17 GM/SCOOP powder Take 17 g by mouth daily as needed (constipation).    [provider]  rosuvastatin (CRESTOR) 20 MG tablet Take 1 tablet (20 mg total) by mouth  every evening. NEED OV. Patient taking differently: Take 20 mg by mouth every evening. (2100) 10/23/21   Croitoru, Dani Gobble, MD  simethicone (MYLICON) 80 MG chewable tablet Chew 80 mg by mouth 4 (four) times daily as needed for flatulence.    [provider]  sucralfate (CARAFATE) 1 g tablet Take 1 tablet (1 g total) by mouth 4 (four) times daily -  with meals and at bedtime. Patient taking differently: Take 1 g by mouth 4 (four) times daily -  before meals and at bedtime. (0800, 1200, 1600, 2100) 02/19/22   Antonieta Pert, MD  vitamin B-12 (CYANOCOBALAMIN) 1000 MCG tablet Take 1 tablet (1,000 mcg total) by mouth daily. Patient taking differently: Take 1,000 mcg by mouth in the morning. (0900) 08/15/21   Caren Griffins, MD      Allergies    Ivp dye [iodinated contrast media], Shellfish  allergy, Sulfa antibiotics, Iodine, Benadryl [diphenhydramine], Lipitor [atorvastatin], Mitigare [colchicine], Uloric [febuxostat], Vibra-tab [doxycycline], Keflex [cephalexin], and Zithromax [azithromycin]    Review of Systems   Review of Systems  Respiratory:  Positive for shortness of breath.   All other systems reviewed and are negative.   Physical Exam Updated Vital Signs BP (!) 155/75 (BP Location: Left Arm)   Pulse 81   Temp 99.5 F (37.5 C) (Oral)   Resp 16   Ht '5\' 3"'$  (1.6 m)   Wt 85 kg   SpO2 99%   BMI 33.19 kg/m  Physical Exam Vitals and nursing note reviewed.   77 year old female, resting comfortably and in no acute distress. Vital signs are significant for elevated blood pressure. Oxygen saturation is 99%, which is normal. Head is normocephalic and atraumatic. PERRLA, EOMI. Oropharynx is clear. Neck is nontender and supple without adenopathy or JVD. Back is nontender and there is no CVA tenderness.  There is 1+ presacral edema. Lungs have bibasilar rales about one third of the way up.  There are no wheezes or rhonchi. Chest is nontender. Heart has regular rate and rhythm without  murmur. Abdomen is soft, flat, nontender without masses or hepatosplenomegaly and peristalsis is normoactive. Extremities have 1+ edema on the right, 2+ edema on the left (recent left hip surgery accounts for different amounts of edema). Skin is warm and dry without rash. Neurologic: Awake and alert and oriented with no focal neurologic deficits identified.  ED Results / Procedures / Treatments   Labs (all labs ordered are listed, but only abnormal results are displayed) Labs Reviewed  BASIC METABOLIC PANEL - Abnormal; Notable for the following components:      Result Value   Potassium 3.0 (*)    Glucose, Bld 130 (*)    BUN 29 (*)    Creatinine, Ser 1.85 (*)    GFR, Estimated 28 (*)    All other components within normal limits  BRAIN NATRIURETIC PEPTIDE - Abnormal; Notable for the following components:   B Natriuretic Peptide 666.9 (*)    All other components within normal limits  CBC WITH DIFFERENTIAL/PLATELET - Abnormal; Notable for the following components:   RBC 2.88 (*)    Hemoglobin 8.4 (*)    HCT 28.0 (*)    All other components within normal limits  TROPONIN I (HIGH SENSITIVITY) - Abnormal; Notable for the following components:   Troponin I (High Sensitivity) 28 (*)    All other components within normal limits    EKG EKG Interpretation  Date/Time:  Tuesday March 04 2022 03:26:27 EST Ventricular Rate:  79 PR Interval:  161 QRS Duration: 156 QT Interval:  443 QTC Calculation: 508 R Axis:   53 Text Interpretation: Atrial-sensed ventricular-paced rhythm When compared with ECG of 11/01/2021, No significant change was found Confirmed by Delora Fuel ([id number]) on 03/04/2022 3:47:22 AM  Radiology No results found.  Procedures Procedures  Cardiac monitor shows paced rhythm, per my interpretation.  Medications Ordered in ED Medications  furosemide (LASIX) injection 40 mg (has no administration in time range)    ED Course/ Medical Decision Making/ A&P                              Medical Decision Making Amount and/or Complexity of Data Reviewed Labs: ordered. Radiology: ordered.  Risk Prescription drug management.   Shortness of breath which certainly seems to be a heart failure exacerbation with peripheral edema and rales.  I have ordered chest x-ray, CBC, basic metabolic panel, troponin, brain natruretic protein.  I have reviewed and interpreted her electrocardiogram, and my interpretation is atrial sensed ventricular paced rhythm unchanged from prior.  I have reviewed her past records, and on 08/05/2021 she had an echocardiogram showing ejection fraction of 45-50% with apical hypokinesis and indeterminate diastolic parameters.  She did have surgery on 02/12/2022 for left total hip arthroplasty.  I have ordered a dose of intravenous furosemide.  She had good diuresis with intravenous furosemide.  Chest x-ray shows cardiomegaly and official report states no pulmonary edema.  I have independently viewed the image, and I agree with the radiologist interpretation except I do believe that there is some interstitial edema present.  I have reviewed and interpreted her laboratory test, and my interpretation is mild hypokalemia, renal insufficiency which is improved compared with baseline, elevated BNP slightly worse than prior, mildly elevated troponin which is improved compared with prior.  Elevated troponin is felt to represent demand ischemia and her troponin has been consistently elevated in the past and I do not feel repeat troponin is indicated.  She has stable anemia.  I have ordered oral potassium for her.  She is resting comfortably with oxygen saturation 100%.  She still endorses some difficulty breathing but she points to her throat, saying that air does not seem to be moving through there.  I see no stridor.  I feel she is safe for discharge.  I am discharging her with prescription for furosemide 40 mg twice daily and oral potassium.  Her primary care provider  will need to adjust furosemide dose as needed and adjust potassium as needed.  Return precautions discussed.  Final Clinical Impression(s) / ED Diagnoses Final diagnoses:  Acute on chronic congestive heart failure, unspecified heart failure type (HCC)  Hypokalemia  Renal insufficiency  Normochromic normocytic anemia  Elevated blood pressure reading with diagnosis of hypertension  Elevated troponin    Rx / DC Orders ED Discharge Orders          Ordered    furosemide (LASIX) 40 MG tablet  2 times daily        03/04/22 0722    potassium chloride SA (KLOR-CON M) 20 MEQ tablet  2 times daily,   Status:  Discontinued        03/04/22 0723    potassium chloride SA (KLOR-CON M) 20 MEQ tablet  2 times daily        03/04/22 0370              Delora Fuel, MD 48/88/91 [phone]

## 2022-03-04 NOTE — Discharge Instructions (Addendum)
Until changed by your primary care provider, you should take furosemide 40 mg twice a day with extra dose of furosemide 80 mg as needed if you develop significant swelling  Your potassium was low today, so you need to take extra potassium.

## 2022-03-04 NOTE — ED Notes (Signed)
[facility] called, 30-40 minutes

## 2022-03-05 DIAGNOSIS — M6281 Muscle weakness (generalized): Secondary | ICD-10-CM | POA: Diagnosis not present

## 2022-03-05 DIAGNOSIS — J9611 Chronic respiratory failure with hypoxia: Secondary | ICD-10-CM | POA: Diagnosis not present

## 2022-03-05 DIAGNOSIS — I5033 Acute on chronic diastolic (congestive) heart failure: Secondary | ICD-10-CM | POA: Diagnosis not present

## 2022-03-05 DIAGNOSIS — R2681 Unsteadiness on feet: Secondary | ICD-10-CM | POA: Diagnosis not present

## 2022-03-05 DIAGNOSIS — J449 Chronic obstructive pulmonary disease, unspecified: Secondary | ICD-10-CM | POA: Diagnosis not present

## 2022-03-05 DIAGNOSIS — Z4789 Encounter for other orthopedic aftercare: Secondary | ICD-10-CM | POA: Diagnosis not present

## 2022-03-06 DIAGNOSIS — M84359A Stress fracture, hip, unspecified, initial encounter for fracture: Secondary | ICD-10-CM | POA: Diagnosis not present

## 2022-03-06 DIAGNOSIS — I5033 Acute on chronic diastolic (congestive) heart failure: Secondary | ICD-10-CM | POA: Diagnosis not present

## 2022-03-07 ENCOUNTER — Other Ambulatory Visit: Payer: Self-pay | Admitting: *Deleted

## 2022-03-07 NOTE — Patient Outreach (Deleted)
Late entry for 03/06/22  Met with [facility] social worker. Bubba Hales reports Mrs. Bollen will transition to home on 03/06/22 with hospice services provided by [facility].   PCP office [facility] has [facility] care management.   Will send secure notification to [facility].   Marthenia Rolling, MSN, RN,BSN [place] [facility] Coordinator [phone] (Direct dial)

## 2022-03-07 NOTE — Patient Outreach (Unsigned)
Late entry for 03/06/22  Mrs. Bartell resides in [facility]. Screening for care coordination services as benefit of health plan and Primary Care Provider.   Met with [facility] social worker. Transition plan is to return home with son.   PCP office [facility] [place] has [facility] care management.  Will continue to follow.

## 2022-03-08 DIAGNOSIS — J9611 Chronic respiratory failure with hypoxia: Secondary | ICD-10-CM | POA: Diagnosis not present

## 2022-03-08 DIAGNOSIS — J9612 Chronic respiratory failure with hypercapnia: Secondary | ICD-10-CM | POA: Diagnosis not present

## 2022-03-08 DIAGNOSIS — I5022 Chronic systolic (congestive) heart failure: Secondary | ICD-10-CM | POA: Diagnosis not present

## 2022-03-10 DIAGNOSIS — I5033 Acute on chronic diastolic (congestive) heart failure: Secondary | ICD-10-CM | POA: Diagnosis not present

## 2022-03-10 DIAGNOSIS — M84359A Stress fracture, hip, unspecified, initial encounter for fracture: Secondary | ICD-10-CM | POA: Diagnosis not present

## 2022-03-12 DIAGNOSIS — Z4789 Encounter for other orthopedic aftercare: Secondary | ICD-10-CM | POA: Diagnosis not present

## 2022-03-12 DIAGNOSIS — M6281 Muscle weakness (generalized): Secondary | ICD-10-CM | POA: Diagnosis not present

## 2022-03-12 DIAGNOSIS — R2681 Unsteadiness on feet: Secondary | ICD-10-CM | POA: Diagnosis not present

## 2022-03-12 DIAGNOSIS — J449 Chronic obstructive pulmonary disease, unspecified: Secondary | ICD-10-CM | POA: Diagnosis not present

## 2022-03-12 DIAGNOSIS — J9611 Chronic respiratory failure with hypoxia: Secondary | ICD-10-CM | POA: Diagnosis not present

## 2022-03-13 ENCOUNTER — Encounter (HOSPITAL_COMMUNITY)
Admission: RE | Admit: 2022-03-13 | Discharge: 2022-03-13 | Disposition: A | Discharge status: Home / Self Care | Payer: PRIVATE HEALTH INSURANCE | Source: Ambulatory Visit | Attending: Nephrology | Admitting: Nephrology

## 2022-03-13 VITALS — BP 102/57 | HR 88 | Temp 97.8°F | Resp 20

## 2022-03-13 DIAGNOSIS — D631 Anemia in chronic kidney disease: Secondary | ICD-10-CM | POA: Diagnosis present

## 2022-03-13 DIAGNOSIS — R0600 Dyspnea, unspecified: Secondary | ICD-10-CM | POA: Diagnosis not present

## 2022-03-13 DIAGNOSIS — N185 Chronic kidney disease, stage 5: Secondary | ICD-10-CM | POA: Insufficient documentation

## 2022-03-13 DIAGNOSIS — M84359A Stress fracture, hip, unspecified, initial encounter for fracture: Secondary | ICD-10-CM | POA: Diagnosis not present

## 2022-03-13 DIAGNOSIS — I5033 Acute on chronic diastolic (congestive) heart failure: Secondary | ICD-10-CM | POA: Diagnosis not present

## 2022-03-13 LAB — IRON AND TIBC
Iron: 38 ug/dL (ref 28–170)
Saturation Ratios: 18 % (ref 10.4–31.8)
TIBC: 213 ug/dL — ABNORMAL LOW (ref 250–450)
UIBC: 175 ug/dL

## 2022-03-13 LAB — RENAL FUNCTION PANEL
Albumin: 2.6 g/dL — ABNORMAL LOW (ref 3.5–5.0)
Anion gap: 7 (ref 5–15)
BUN: 40 mg/dL — ABNORMAL HIGH (ref 8–23)
CO2: 35 mmol/L — ABNORMAL HIGH (ref 22–32)
Calcium: 9.1 mg/dL (ref 8.9–10.3)
Chloride: 94 mmol/L — ABNORMAL LOW (ref 98–111)
Creatinine, Ser: 2.04 mg/dL — ABNORMAL HIGH (ref 0.44–1.00)
GFR, Estimated: 25 mL/min — ABNORMAL LOW (ref >60)
Glucose, Bld: 172 mg/dL — ABNORMAL HIGH (ref 70–99)
Phosphorus: 2.9 mg/dL (ref 2.5–4.6)
Potassium: 2.9 mmol/L — ABNORMAL LOW (ref 3.5–5.1)
Sodium: 136 mmol/L (ref 135–145)

## 2022-03-13 LAB — POCT HEMOGLOBIN-HEMACUE: Hemoglobin: 7.8 g/dL — ABNORMAL LOW (ref 12.0–15.0)

## 2022-03-13 LAB — FERRITIN: Ferritin: 738 ng/mL — ABNORMAL HIGH (ref 11–307)

## 2022-03-13 MED ORDER — EPOETIN ALFA-EPBX 10000 UNIT/ML IJ SOLN
INTRAMUSCULAR | Status: AC
  Filled 2022-03-13: qty 2

## 2022-03-13 MED ORDER — EPOETIN ALFA-EPBX 10000 UNIT/ML IJ SOLN
20000.0000 [IU] | INTRAMUSCULAR | Status: DC
  Administered 2022-03-13: 20000 [IU] via SUBCUTANEOUS

## 2022-03-13 NOTE — Progress Notes (Signed)
Spoke with Museum/gallery conservator at [facility] re: HGB 7.8, patient denies any bleeding or INcreased SOB- per [employer] patient has not been in awhile so was okay to give med as ordered.

## 2022-03-14 LAB — PTH, INTACT AND CALCIUM
Calcium, Total (PTH): 9.3 mg/dL (ref 8.7–10.3)
PTH: 57 pg/mL (ref 15–65)

## 2022-03-17 ENCOUNTER — Encounter (HOSPITAL_COMMUNITY): Payer: Self-pay

## 2022-03-17 ENCOUNTER — Other Ambulatory Visit: Payer: Self-pay

## 2022-03-17 ENCOUNTER — Inpatient Hospital Stay (HOSPITAL_COMMUNITY): Payer: 59

## 2022-03-17 ENCOUNTER — Emergency Department (HOSPITAL_COMMUNITY): Payer: 59

## 2022-03-17 ENCOUNTER — Emergency Department (HOSPITAL_BASED_OUTPATIENT_CLINIC_OR_DEPARTMENT_OTHER): Payer: 59

## 2022-03-17 ENCOUNTER — Inpatient Hospital Stay (HOSPITAL_COMMUNITY)
Admission: EM | Admit: 2022-03-17 | Discharge: 2022-03-24 | DRG: 369 | Disposition: A | Discharge status: Skilled Nursing Facility | Payer: 59 | Attending: Internal Medicine | Admitting: Internal Medicine

## 2022-03-17 DIAGNOSIS — Z6833 Body mass index (BMI) 33.0-33.9, adult: Secondary | ICD-10-CM

## 2022-03-17 DIAGNOSIS — I2510 Atherosclerotic heart disease of native coronary artery without angina pectoris: Secondary | ICD-10-CM | POA: Diagnosis present

## 2022-03-17 DIAGNOSIS — K5909 Other constipation: Secondary | ICD-10-CM | POA: Diagnosis present

## 2022-03-17 DIAGNOSIS — K2289 Other specified disease of esophagus: Secondary | ICD-10-CM | POA: Diagnosis not present

## 2022-03-17 DIAGNOSIS — I5042 Chronic combined systolic (congestive) and diastolic (congestive) heart failure: Secondary | ICD-10-CM | POA: Diagnosis not present

## 2022-03-17 DIAGNOSIS — I130 Hypertensive heart and chronic kidney disease with heart failure and stage 1 through stage 4 chronic kidney disease, or unspecified chronic kidney disease: Secondary | ICD-10-CM | POA: Diagnosis not present

## 2022-03-17 DIAGNOSIS — Z882 Allergy status to sulfonamides status: Secondary | ICD-10-CM

## 2022-03-17 DIAGNOSIS — Z87891 Personal history of nicotine dependence: Secondary | ICD-10-CM | POA: Diagnosis not present

## 2022-03-17 DIAGNOSIS — E1122 Type 2 diabetes mellitus with diabetic chronic kidney disease: Secondary | ICD-10-CM | POA: Diagnosis present

## 2022-03-17 DIAGNOSIS — I2583 Coronary atherosclerosis due to lipid rich plaque: Secondary | ICD-10-CM | POA: Diagnosis not present

## 2022-03-17 DIAGNOSIS — J449 Chronic obstructive pulmonary disease, unspecified: Secondary | ICD-10-CM | POA: Diagnosis not present

## 2022-03-17 DIAGNOSIS — K2970 Gastritis, unspecified, without bleeding: Secondary | ICD-10-CM | POA: Diagnosis present

## 2022-03-17 DIAGNOSIS — Z743 Need for continuous supervision: Secondary | ICD-10-CM | POA: Diagnosis not present

## 2022-03-17 DIAGNOSIS — R531 Weakness: Secondary | ICD-10-CM | POA: Diagnosis not present

## 2022-03-17 DIAGNOSIS — D649 Anemia, unspecified: Secondary | ICD-10-CM | POA: Diagnosis not present

## 2022-03-17 DIAGNOSIS — R52 Pain, unspecified: Secondary | ICD-10-CM

## 2022-03-17 DIAGNOSIS — E1165 Type 2 diabetes mellitus with hyperglycemia: Secondary | ICD-10-CM | POA: Diagnosis present

## 2022-03-17 DIAGNOSIS — K5900 Constipation, unspecified: Secondary | ICD-10-CM | POA: Diagnosis present

## 2022-03-17 DIAGNOSIS — J45909 Unspecified asthma, uncomplicated: Secondary | ICD-10-CM | POA: Diagnosis not present

## 2022-03-17 DIAGNOSIS — N3289 Other specified disorders of bladder: Secondary | ICD-10-CM | POA: Diagnosis not present

## 2022-03-17 DIAGNOSIS — N184 Chronic kidney disease, stage 4 (severe): Secondary | ICD-10-CM | POA: Diagnosis not present

## 2022-03-17 DIAGNOSIS — E876 Hypokalemia: Secondary | ICD-10-CM | POA: Diagnosis not present

## 2022-03-17 DIAGNOSIS — K551 Chronic vascular disorders of intestine: Secondary | ICD-10-CM | POA: Diagnosis present

## 2022-03-17 DIAGNOSIS — J9611 Chronic respiratory failure with hypoxia: Secondary | ICD-10-CM | POA: Diagnosis not present

## 2022-03-17 DIAGNOSIS — K859 Acute pancreatitis without necrosis or infection, unspecified: Principal | ICD-10-CM

## 2022-03-17 DIAGNOSIS — D631 Anemia in chronic kidney disease: Secondary | ICD-10-CM | POA: Diagnosis not present

## 2022-03-17 DIAGNOSIS — L89152 Pressure ulcer of sacral region, stage 2: Secondary | ICD-10-CM | POA: Diagnosis present

## 2022-03-17 DIAGNOSIS — N281 Cyst of kidney, acquired: Secondary | ICD-10-CM | POA: Diagnosis not present

## 2022-03-17 DIAGNOSIS — I4891 Unspecified atrial fibrillation: Secondary | ICD-10-CM | POA: Diagnosis present

## 2022-03-17 DIAGNOSIS — R609 Edema, unspecified: Secondary | ICD-10-CM

## 2022-03-17 DIAGNOSIS — I442 Atrioventricular block, complete: Secondary | ICD-10-CM | POA: Diagnosis not present

## 2022-03-17 DIAGNOSIS — R110 Nausea: Secondary | ICD-10-CM

## 2022-03-17 DIAGNOSIS — R195 Other fecal abnormalities: Secondary | ICD-10-CM

## 2022-03-17 DIAGNOSIS — M109 Gout, unspecified: Secondary | ICD-10-CM | POA: Diagnosis present

## 2022-03-17 DIAGNOSIS — R0902 Hypoxemia: Secondary | ICD-10-CM | POA: Diagnosis not present

## 2022-03-17 DIAGNOSIS — K760 Fatty (change of) liver, not elsewhere classified: Secondary | ICD-10-CM | POA: Diagnosis not present

## 2022-03-17 DIAGNOSIS — Z7902 Long term (current) use of antithrombotics/antiplatelets: Secondary | ICD-10-CM

## 2022-03-17 DIAGNOSIS — B3781 Candidal esophagitis: Secondary | ICD-10-CM | POA: Diagnosis not present

## 2022-03-17 DIAGNOSIS — Z993 Dependence on wheelchair: Secondary | ICD-10-CM | POA: Diagnosis not present

## 2022-03-17 DIAGNOSIS — Z91013 Allergy to seafood: Secondary | ICD-10-CM

## 2022-03-17 DIAGNOSIS — K219 Gastro-esophageal reflux disease without esophagitis: Secondary | ICD-10-CM | POA: Diagnosis not present

## 2022-03-17 DIAGNOSIS — I480 Paroxysmal atrial fibrillation: Secondary | ICD-10-CM | POA: Diagnosis present

## 2022-03-17 DIAGNOSIS — K229 Disease of esophagus, unspecified: Secondary | ICD-10-CM | POA: Diagnosis not present

## 2022-03-17 DIAGNOSIS — Z951 Presence of aortocoronary bypass graft: Secondary | ICD-10-CM

## 2022-03-17 DIAGNOSIS — Z79899 Other long term (current) drug therapy: Secondary | ICD-10-CM

## 2022-03-17 DIAGNOSIS — Z79891 Long term (current) use of opiate analgesic: Secondary | ICD-10-CM

## 2022-03-17 DIAGNOSIS — Z825 Family history of asthma and other chronic lower respiratory diseases: Secondary | ICD-10-CM

## 2022-03-17 DIAGNOSIS — E785 Hyperlipidemia, unspecified: Secondary | ICD-10-CM | POA: Diagnosis not present

## 2022-03-17 DIAGNOSIS — Z888 Allergy status to other drugs, medicaments and biological substances status: Secondary | ICD-10-CM

## 2022-03-17 DIAGNOSIS — Z955 Presence of coronary angioplasty implant and graft: Secondary | ICD-10-CM

## 2022-03-17 DIAGNOSIS — K2951 Unspecified chronic gastritis with bleeding: Secondary | ICD-10-CM | POA: Diagnosis not present

## 2022-03-17 DIAGNOSIS — Z91041 Radiographic dye allergy status: Secondary | ICD-10-CM

## 2022-03-17 DIAGNOSIS — Z8249 Family history of ischemic heart disease and other diseases of the circulatory system: Secondary | ICD-10-CM

## 2022-03-17 DIAGNOSIS — G4733 Obstructive sleep apnea (adult) (pediatric): Secondary | ICD-10-CM | POA: Diagnosis not present

## 2022-03-17 DIAGNOSIS — R202 Paresthesia of skin: Secondary | ICD-10-CM | POA: Diagnosis not present

## 2022-03-17 DIAGNOSIS — E7800 Pure hypercholesterolemia, unspecified: Secondary | ICD-10-CM | POA: Diagnosis not present

## 2022-03-17 DIAGNOSIS — I509 Heart failure, unspecified: Secondary | ICD-10-CM | POA: Diagnosis not present

## 2022-03-17 DIAGNOSIS — K2990 Gastroduodenitis, unspecified, without bleeding: Secondary | ICD-10-CM | POA: Diagnosis present

## 2022-03-17 DIAGNOSIS — Z7401 Bed confinement status: Secondary | ICD-10-CM | POA: Diagnosis not present

## 2022-03-17 DIAGNOSIS — R1013 Epigastric pain: Secondary | ICD-10-CM | POA: Diagnosis not present

## 2022-03-17 DIAGNOSIS — R109 Unspecified abdominal pain: Secondary | ICD-10-CM | POA: Diagnosis not present

## 2022-03-17 DIAGNOSIS — Z7982 Long term (current) use of aspirin: Secondary | ICD-10-CM

## 2022-03-17 DIAGNOSIS — K6389 Other specified diseases of intestine: Secondary | ICD-10-CM | POA: Diagnosis not present

## 2022-03-17 DIAGNOSIS — Z981 Arthrodesis status: Secondary | ICD-10-CM

## 2022-03-17 DIAGNOSIS — G8929 Other chronic pain: Secondary | ICD-10-CM | POA: Diagnosis present

## 2022-03-17 DIAGNOSIS — I25119 Atherosclerotic heart disease of native coronary artery with unspecified angina pectoris: Secondary | ICD-10-CM | POA: Diagnosis not present

## 2022-03-17 DIAGNOSIS — Z95810 Presence of automatic (implantable) cardiac defibrillator: Secondary | ICD-10-CM

## 2022-03-17 DIAGNOSIS — Z833 Family history of diabetes mellitus: Secondary | ICD-10-CM

## 2022-03-17 DIAGNOSIS — K31A11 Gastric intestinal metaplasia without dysplasia, involving the antrum: Secondary | ICD-10-CM | POA: Diagnosis not present

## 2022-03-17 DIAGNOSIS — I110 Hypertensive heart disease with heart failure: Secondary | ICD-10-CM | POA: Diagnosis not present

## 2022-03-17 DIAGNOSIS — Z8673 Personal history of transient ischemic attack (TIA), and cerebral infarction without residual deficits: Secondary | ICD-10-CM

## 2022-03-17 DIAGNOSIS — R932 Abnormal findings on diagnostic imaging of liver and biliary tract: Secondary | ICD-10-CM | POA: Diagnosis not present

## 2022-03-17 LAB — CBC WITH DIFFERENTIAL/PLATELET
Abs Immature Granulocytes: 0.04 10*3/uL (ref 0.00–0.07)
Basophils Absolute: 0 10*3/uL (ref 0.0–0.1)
Basophils Relative: 1 %
Eosinophils Absolute: 0.4 10*3/uL (ref 0.0–0.5)
Eosinophils Relative: 5 %
HCT: 26.7 % — ABNORMAL LOW (ref 36.0–46.0)
Hemoglobin: 7.9 g/dL — ABNORMAL LOW (ref 12.0–15.0)
Immature Granulocytes: 1 %
Lymphocytes Relative: 15 %
Lymphs Abs: 1.2 10*3/uL (ref 0.7–4.0)
MCH: 28.8 pg (ref 26.0–34.0)
MCHC: 29.6 g/dL — ABNORMAL LOW (ref 30.0–36.0)
MCV: 97.4 fL (ref 80.0–100.0)
Monocytes Absolute: 0.7 10*3/uL (ref 0.1–1.0)
Monocytes Relative: 8 %
Neutro Abs: 5.7 10*3/uL (ref 1.7–7.7)
Neutrophils Relative %: 70 %
Platelets: 180 10*3/uL (ref 150–400)
RBC: 2.74 MIL/uL — ABNORMAL LOW (ref 3.87–5.11)
RDW: 15.1 % (ref 11.5–15.5)
WBC: 8 10*3/uL (ref 4.0–10.5)
nRBC: 0 % (ref 0.0–0.2)

## 2022-03-17 LAB — COMPREHENSIVE METABOLIC PANEL
ALT: 18 U/L (ref 0–44)
AST: 25 U/L (ref 15–41)
Albumin: 2.6 g/dL — ABNORMAL LOW (ref 3.5–5.0)
Alkaline Phosphatase: 173 U/L — ABNORMAL HIGH (ref 38–126)
Anion gap: 16 — ABNORMAL HIGH (ref 5–15)
BUN: 45 mg/dL — ABNORMAL HIGH (ref 8–23)
CO2: 32 mmol/L (ref 22–32)
Calcium: 9.4 mg/dL (ref 8.9–10.3)
Chloride: 90 mmol/L — ABNORMAL LOW (ref 98–111)
Creatinine, Ser: 2.38 mg/dL — ABNORMAL HIGH (ref 0.44–1.00)
GFR, Estimated: 20 mL/min — ABNORMAL LOW (ref >60)
Glucose, Bld: 169 mg/dL — ABNORMAL HIGH (ref 70–99)
Potassium: 2.9 mmol/L — ABNORMAL LOW (ref 3.5–5.1)
Sodium: 138 mmol/L (ref 135–145)
Total Bilirubin: 0.5 mg/dL (ref 0.3–1.2)
Total Protein: 6.1 g/dL — ABNORMAL LOW (ref 6.5–8.1)

## 2022-03-17 LAB — GLUCOSE, CAPILLARY: Glucose-Capillary: 120 mg/dL — ABNORMAL HIGH (ref 70–99)

## 2022-03-17 LAB — LIPASE, BLOOD: Lipase: 1308 U/L — ABNORMAL HIGH (ref 11–51)

## 2022-03-17 MED ORDER — HYDROMORPHONE HCL 1 MG/ML IJ SOLN
1.0000 mg | Freq: Once | INTRAMUSCULAR | Status: AC
  Administered 2022-03-17: 1 mg via INTRAVENOUS
  Filled 2022-03-17: qty 1

## 2022-03-17 MED ORDER — ENOXAPARIN SODIUM 30 MG/0.3ML IJ SOSY
30.0000 mg | PREFILLED_SYRINGE | INTRAMUSCULAR | Status: DC
  Administered 2022-03-18 – 2022-03-19 (×2): 30 mg via SUBCUTANEOUS
  Filled 2022-03-17 (×2): qty 0.3

## 2022-03-17 MED ORDER — POTASSIUM CHLORIDE IN NACL 20-0.9 MEQ/L-% IV SOLN
INTRAVENOUS | Status: DC
  Filled 2022-03-17 (×2): qty 1000

## 2022-03-17 MED ORDER — ACETAMINOPHEN 650 MG RE SUPP
650.0000 mg | Freq: Four times a day (QID) | RECTAL | Status: DC | PRN
  Administered 2022-03-19: 650 mg via RECTAL
  Filled 2022-03-17: qty 1

## 2022-03-17 MED ORDER — ONDANSETRON HCL 4 MG PO TABS
4.0000 mg | ORAL_TABLET | Freq: Four times a day (QID) | ORAL | Status: DC | PRN
  Administered 2022-03-23 (×2): 4 mg via ORAL
  Filled 2022-03-17 (×2): qty 1

## 2022-03-17 MED ORDER — INSULIN ASPART 100 UNIT/ML IJ SOLN
0.0000 [IU] | Freq: Four times a day (QID) | INTRAMUSCULAR | Status: DC
  Administered 2022-03-18: 1 [IU] via SUBCUTANEOUS
  Administered 2022-03-18: 3 [IU] via SUBCUTANEOUS
  Administered 2022-03-19 – 2022-03-20 (×4): 1 [IU] via SUBCUTANEOUS
  Administered 2022-03-20: 2 [IU] via SUBCUTANEOUS
  Administered 2022-03-21: 1 [IU] via SUBCUTANEOUS
  Administered 2022-03-21: 2 [IU] via SUBCUTANEOUS
  Administered 2022-03-21: 1 [IU] via SUBCUTANEOUS
  Administered 2022-03-21: 2 [IU] via SUBCUTANEOUS
  Administered 2022-03-22: 1 [IU] via SUBCUTANEOUS
  Administered 2022-03-22 (×2): 2 [IU] via SUBCUTANEOUS
  Administered 2022-03-23 (×3): 1 [IU] via SUBCUTANEOUS
  Administered 2022-03-24: 2 [IU] via SUBCUTANEOUS

## 2022-03-17 MED ORDER — ONDANSETRON HCL 4 MG/2ML IJ SOLN
4.0000 mg | Freq: Four times a day (QID) | INTRAMUSCULAR | Status: DC | PRN
  Administered 2022-03-20 – 2022-03-23 (×2): 4 mg via INTRAVENOUS
  Filled 2022-03-17 (×2): qty 2

## 2022-03-17 MED ORDER — ACETAMINOPHEN 325 MG PO TABS
650.0000 mg | ORAL_TABLET | Freq: Four times a day (QID) | ORAL | Status: DC | PRN
  Administered 2022-03-18 – 2022-03-23 (×4): 650 mg via ORAL
  Filled 2022-03-17 (×4): qty 2

## 2022-03-17 MED ORDER — POTASSIUM CHLORIDE IN NACL 20-0.9 MEQ/L-% IV SOLN
Freq: Once | INTRAVENOUS | Status: AC
  Filled 2022-03-17: qty 1000

## 2022-03-17 MED ORDER — MAGNESIUM HYDROXIDE 400 MG/5ML PO SUSP: 30.0000 mL | Freq: Every day | ORAL | Status: DC | PRN

## 2022-03-17 MED ORDER — POTASSIUM CHLORIDE CRYS ER 20 MEQ PO TBCR
40.0000 meq | EXTENDED_RELEASE_TABLET | Freq: Once | ORAL | Status: AC
  Administered 2022-03-17: 40 meq via ORAL
  Filled 2022-03-17: qty 2

## 2022-03-17 MED ORDER — MORPHINE SULFATE (PF) 2 MG/ML IV SOLN
2.0000 mg | INTRAVENOUS | Status: DC | PRN
  Administered 2022-03-18 – 2022-03-22 (×10): 2 mg via INTRAVENOUS
  Filled 2022-03-17 (×10): qty 1

## 2022-03-17 MED ORDER — KETOROLAC TROMETHAMINE 15 MG/ML IJ SOLN
15.0000 mg | Freq: Four times a day (QID) | INTRAMUSCULAR | Status: DC | PRN
  Administered 2022-03-17 – 2022-03-18 (×2): 15 mg via INTRAVENOUS
  Filled 2022-03-17 (×2): qty 1

## 2022-03-17 MED ORDER — TRAZODONE HCL 50 MG PO TABS
25.0000 mg | ORAL_TABLET | Freq: Every evening | ORAL | Status: DC | PRN
  Administered 2022-03-17 – 2022-03-23 (×5): 25 mg via ORAL
  Filled 2022-03-17 (×5): qty 1

## 2022-03-17 NOTE — H&P (Addendum)
[employer]   PATIENT NAME: Rollande Thursby    MR#:  [id number]  DATE OF BIRTH:  1945-03-06  DATE OF ADMISSION:  03/17/2022  PRIMARY CARE PHYSICIAN: [name], [name], MD   Patient is coming from: Home  REQUESTING/REFERRING PHYSICIAN: Carmin Muskrat, MD   CHIEF COMPLAINT:   Chief Complaint  Patient presents with   Abdominal Pain    HISTORY OF PRESENT ILLNESS:  CINZIA DEVOS is a 77 y.o. female with medical history significant for asthma, atrial fibrillation, coronary artery disease, status post PCI and stent, GERD, gout, hypertension, dyslipidemia and OSA as well as CVA and type 2 diabetes mellitus, who presented to the ER with generalized abdominal pain which have been worsening over the last 3 weeks with associated anorexia without nausea or vomiting.  She denies any fever or chills.  She denies any diarrhea except with Linzess.  No dysuria, oliguria or hematuria or flank pain.  No chest pain or palpitations.  No cough or wheezing or dyspnea.  ED Course: Upon presentation to the emergency room vital signs were within normal.  Pulse oximetry was 100% on 3 L of O2 by nasal cannula.  Labs revealed hypokalemia of 2.9 and hypochloremia of 90, blood glucose 169, BUN of 45 and creatinine 2.38 compared to 40/2.04 on 03/13/2022.  Alk phos was 173 with albumin 2.6.  Lipase level came back 1508 and total protein 6.1.  CBC showed anemia with hemoglobin 7.9 hematocrit 26.7 close to baseline.  Imaging: Abdominal pelvic CT scan showed the following: 1. No acute findings within the abdomen or pelvis. 2. 2 isoattenuating right renal masses have increased in size from previous CT. Although these may still reflect complicated/hemorrhagic cysts, recommend further assessment with nonemergent renal ultrasound. 3. Small lung base nodules as detailed stable from the CT dated 09/09/2021, and from exam from 2019, benign with no follow-up recommended. 4. Aortic atherosclerosis. 5. Rectum moderately  distended with stool.  Right upper quadrant ultrasound revealed the following: 1. Findings consistent with history of prior cholecystectomy. 2. Hepatic steatosis without focal liver lesions. 3. Right renal cyst which spine to the cystic lesion seen within the right kidney on the prior abdomen pelvis CT (March 17, 2022). Correlation with 6 week follow-up contrast-enhanced abdomen and pelvis CT is recommended for improved characterization and to determine stability.  The patient was given hydration with IV normal saline with 20 mill [place] p.o. potassium chloride as well as 1 mg of IV Dilaudid.  She will be admitted to a medical bed for further evaluation and management.  PAST MEDICAL HISTORY:   Past Medical History:  Diagnosis Date   AICD (automatic cardioverter/defibrillator) present    downgraded to CRT-P in 2014   Anemia    Arthritis    Asthma    Atrial fibrillation ([place]) 10/24/2015   Questionable history of A Fib/A Flutter. On device interrogation on 9/7, showed 0.47mn of A Fib/A Flutter with 1% burden.  Device check in [place] 2018 showed no sustained AF (runs of less than 30 seconds). No anticoagulation indicated.   CAD (coronary artery disease)    a. 10/09/16 LHC: SVG->LAD patent, SVG->Diag patent, SVG->RCA patent, SVG->LCx occluded. EF 60%, b. 10/31/16 LHC DES to AV groove Circ, DES to intermed branch   Chronic lower back pain    Fatty liver disease, nonalcoholic    GERD (gastroesophageal reflux disease)    Gout    H/O hiatal hernia    High cholesterol    Hyperplastic colon polyp  Hypertension    IBS (irritable bowel syndrome)    Internal hemorrhoids    INTERNAL HEMORRHOIDS WITHOUT MENTION COMP 04/12/2007   Qualifier: Diagnosis of  By: Olevia Perches MD, Dora M    Nonischemic cardiomyopathy ([place]) 11/22/2011   Pt responded to BiV ICD- last EF 55-60% Sept 2017   Obesity    OSA (obstructive sleep apnea)    "can't tolerate a mask", wears 2L nocturnal O2   Presence of permanent  cardiac pacemaker    11/02/12 [employer] V273 INTUA PPM   Stroke ([place]) 09/2020   Type II diabetes mellitus ([place])     PAST SURGICAL HISTORY:   Past Surgical History:  Procedure Laterality Date   ABDOMINAL ULTRASOUND  12/01/2011   Peripelvic cysts- #1- 2.4x1.9x2.3cm, #2-1.2x0.9x1.2cm   ANKLE FRACTURE SURGERY Right    "had rod put in"   ANTERIOR CERVICAL DECOMP/DISCECTOMY FUSION     APPENDECTOMY     BACK SURGERY     BIOPSY  12/29/2017   Procedure: BIOPSY;  Surgeon: [name], MD;  Location: [facility];  Service: Endoscopy;;   BIV ICD INSERTION CRT-D  2001?   BIV PACEMAKER GENERATOR CHANGE OUT N/A 11/02/2012   Procedure: BIV PACEMAKER GENERATOR CHANGE OUT;  Surgeon: Sanda Klein, MD;  Location: [place] [facility];  Service: Cardiovascular;  Laterality: N/A;   CARDIAC CATHETERIZATION  05/17/1999   No significant coronary obstructive disease w/ mild 20% luminal irregularity of the first diag branch of the LAD   CARDIAC CATHETERIZATION  07/08/2002   No significant CAD, moderately depressed LV systolic function   CARDIAC CATHETERIZATION Bilateral 04/26/2007   Normal findings, recommend medical therapy   CARDIAC CATHETERIZATION  02/18/2008   Moderate CAD, would benefit from having a functional study, recommend continue medical therapy   CARDIAC CATHETERIZATION  07/23/2012   Medical therapy   CARDIAC CATHETERIZATION N/A 11/24/2014   Procedure: Left Heart Cath and Coronary Angiography;  Surgeon: Troy Sine, MD;  Location: [place] [facility];  Service: Cardiovascular;  Laterality: N/A;   CARDIAC CATHETERIZATION  11/27/2014   Procedure: Intravascular Pressure Wire/FFR Study;  Surgeon: Peter M Martinique, MD;  Location: [place] [facility];  Service: Cardiovascular;;   CARDIAC CATHETERIZATION  10/09/2016   CHOLECYSTECTOMY N/A 04/09/2018   Procedure: LAPAROSCOPIC CHOLECYSTECTOMY;  Surgeon: Greer Pickerel, MD;  [facility];  Service: General;  Laterality: N/A;   COLONOSCOPY WITH PROPOFOL  N/A 12/29/2017   Procedure: COLONOSCOPY WITH PROPOFOL;  Surgeon: [name], MD;  [facility];  Service: Endoscopy;  Laterality: N/A;   CONVERSION TO TOTAL HIP Left 02/12/2022   Procedure: NAIL REMOVAL WITH CONVERSION TO TOTAL HIP;  Surgeon: Willaim Sheng, MD;  Location: [place];  Service: Orthopedics;  Laterality: Left;   CORONARY ANGIOGRAM  2010   CORONARY ARTERY BYPASS GRAFT N/A 11/29/2014   Procedure: CORONARY ARTERY BYPASS GRAFTING x 5 (LIMA-LAD, SVG-D, SVG-OM1-OM2, SVG-PD);  Surgeon: Melrose Nakayama, MD;  Location: [place];  Service: Open Heart Surgery;  Laterality: N/A;   CORONARY STENT INTERVENTION N/A 10/31/2016   Procedure: CORONARY STENT INTERVENTION;  Surgeon: Burnell Blanks, MD;  Location: [place] [facility];  Service: Cardiovascular;  Laterality: N/A;   ESOPHAGOGASTRODUODENOSCOPY (EGD) WITH PROPOFOL N/A 12/29/2017   Procedure: ESOPHAGOGASTRODUODENOSCOPY (EGD) WITH PROPOFOL;  Surgeon: [name], MD;  [facility];  Service: Endoscopy;  Laterality: N/A;   FRACTURE SURGERY     INSERT / REPLACE / REMOVE PACEMAKER  1999   INTRAMEDULLARY (IM) NAIL INTERTROCHANTERIC Left 04/04/2021  Procedure: INTRAMEDULLARY (IM) NAIL INTERTROCHANTRIC;  Surgeon: Vanetta Mulders, MD;  Location: [facility];  Service: Orthopedics;  Laterality: Left;   IR FLUORO GUIDE CV LINE LEFT  04/11/2021   IR US GUIDE VASC ACCESS LEFT  04/11/2021   KNEE ARTHROSCOPY Bilateral    LEFT HEART CATH AND CORS/GRAFTS ANGIOGRAPHY N/A 12/09/2017   Procedure: LEFT HEART CATH AND CORS/GRAFTS ANGIOGRAPHY;  Surgeon: Troy Sine, MD;  Location: [place] [facility];  Service: Cardiovascular;  Laterality: N/A;   LEFT HEART CATHETERIZATION WITH CORONARY ANGIOGRAM N/A 07/23/2012   Procedure: LEFT HEART CATHETERIZATION WITH CORONARY ANGIOGRAM;  Surgeon: Leonie Man, MD;  Location: [facility] CATH LAB;  Service: Cardiovascular;  Laterality: N/A;   LEXISCAN MYOVIEW  11/14/2011   Mild-moderate defect  seen in Mid Inferolateral and Mid Anterolateral regions-consistant w/ infarct/scar. No significant ischemia demonstrated.   LUMBAR PERCUTANEOUS PEDICLE SCREW 4 LEVEL N/A 09/10/2021   Procedure: THORACIC SEVEN THROUGH THORACIC ELEVEN  PERCUTANEOUS PEDICLE SCREW PLACEMENT;  Surgeon: Judith Part, MD;  Location: [place];  Service: Neurosurgery;  Laterality: N/A;   POLYPECTOMY  12/29/2017   Procedure: POLYPECTOMY;  Surgeon: [name], MD;  [facility];  Service: Endoscopy;;   PPM GENERATOR CHANGEOUT N/A 12/31/2020   Procedure: PPM GENERATOR CHANGEOUT;  Surgeon: Sanda Klein, MD;  Location: [place] CV LAB;  Service: Cardiovascular;  Laterality: N/A;   RIGHT/LEFT HEART CATH AND CORONARY ANGIOGRAPHY N/A 10/09/2016   Procedure: RIGHT/LEFT HEART CATH AND CORONARY ANGIOGRAPHY;  Surgeon: Jolaine Artist, MD;  Location: [place] CV LAB;  Service: Cardiovascular;  Laterality: N/A;   RIGHT/LEFT HEART CATH AND CORONARY/GRAFT ANGIOGRAPHY N/A 03/11/2019   Procedure: RIGHT/LEFT HEART CATH AND CORONARY/GRAFT ANGIOGRAPHY;  Surgeon: Jolaine Artist, MD;  Location: [place] [facility] LAB;  Service: Cardiovascular;  Laterality: N/A;   TEE WITHOUT CARDIOVERSION N/A 11/29/2014   Procedure: TRANSESOPHAGEAL ECHOCARDIOGRAM (TEE);  Surgeon: Melrose Nakayama, MD;  Location: [place];  Service: Open Heart Surgery;  Laterality: N/A;   TRANSCAROTID ARTERY REVASCULARIZATION  Right 10/01/2020   Procedure: RIGHT TRANSCAROTID ARTERY REVASCULARIZATION;  Surgeon: [name], MD;  Location: [facility];  Service: Vascular;  Laterality: Right;   TRANSTHORACIC ECHOCARDIOGRAM  07/23/2012   EF 55-60%, normal-mild   TUBAL LIGATION     ULTRASOUND GUIDANCE FOR VASCULAR ACCESS Left 10/01/2020   Procedure: ULTRASOUND GUIDANCE FOR VASCULAR ACCESS;  Surgeon: [name], MD;  Location[facility];  Service: Vascular;  Laterality: Left;    SOCIAL HISTORY:   Social History   Tobacco Use   Smoking  status: Former    Packs/day: 0.25    Years: 3.00    Total pack years: 0.75    Types: Cigarettes    Quit date: 02/11/1968    Years since quitting: 54.1   Smokeless tobacco: Never  Substance Use Topics   Alcohol use: No    FAMILY HISTORY:   Family History  Problem Relation Age of Onset   Breast cancer Mother    Diabetes Mother    Asthma Sister    Heart disease Maternal Grandmother    Kidney disease Maternal Grandmother    Diabetes Maternal Grandmother    Glaucoma Maternal Aunt    Heart disease Maternal Aunt    Colon cancer Neg Hx    Stomach cancer Neg Hx    Pancreatic cancer Neg Hx     DRUG ALLERGIES:   Allergies  Allergen Reactions   Ivp Dye [Iodinated Contrast Media] Shortness Of Breath    No reaction  to PO contrast with non-ionic dye.06-25-2014/rsm   Shellfish Allergy Anaphylaxis   Sulfa Antibiotics Shortness Of Breath   Iodine Hives   Benadryl [Diphenhydramine] Other (See Comments)    "THIS DRIVES ME CRAZY AND MAKES ME FEEL LIKE I AM DYING"    Lipitor [Atorvastatin] Other (See Comments)    Myalgias    Mitigare [Colchicine] Nausea And Vomiting   Uloric [Febuxostat] Other (See Comments)    Unknown reaction Documented on MAR   Vibra-Tab [Doxycycline] Other (See Comments)    Unknown reaction Documented on MAR   Keflex [Cephalexin] Itching and Rash   Zithromax [Azithromycin] Rash    REVIEW OF SYSTEMS:   ROS As per history of present illness. All pertinent systems were reviewed above. Constitutional, HEENT, cardiovascular, respiratory, GI, GU, musculoskeletal, neuro, psychiatric, endocrine, integumentary and hematologic systems were reviewed and are otherwise negative/unremarkable except for positive findings mentioned above in the HPI.   MEDICATIONS AT HOME:   Prior to Admission medications   Medication Sig Start Date End Date Taking? Authorizing Provider  allopurinol (ZYLOPRIM) 100 MG tablet Take 1 tablet (100 mg total) by mouth daily. Please contact the  office to schedule appointment for additional refills. 1st Attempt. Patient taking differently: Take 100 mg by mouth daily. 12/05/21  Yes Croitoru, [name], MD  aspirin EC 81 MG tablet Take 1 tablet (81 mg total) by mouth daily. 03/27/16  Yes Kilroy, Doreene Burke, PA-C  clopidogrel (PLAVIX) 75 MG tablet Take 1 tablet (75 mg total) by mouth daily. 10/23/21  Yes Croitoru, [name], MD  ferrous sulfate 325 (65 FE) MG tablet Take 1 tablet (325 mg total) by mouth daily with breakfast. 02/19/22 03/21/22 Yes Kc, Maren Beach, MD  fluticasone (FLONASE) 50 MCG/ACT nasal spray Place 1 spray into both nostrils daily as needed for allergies.   Yes [provider]  folic acid (FOLVITE) 1 MG tablet Take 1 tablet (1 mg total) by mouth daily. 02/19/22 03/21/22 Yes Antonieta Pert, MD  isosorbide mononitrate (IMDUR) 30 MG 24 hr tablet Take 3 tablets (90 mg total) by mouth daily. 10/23/21  Yes Croitoru, [name], MD  lidocaine 4 % Place 1 patch onto the skin daily as needed (pain).   Yes [provider]  linaclotide (LINZESS) 290 MCG CAPS capsule Take 290 mcg by mouth daily before breakfast.   Yes [provider]  nitroGLYCERIN (NITROSTAT) 0.4 MG SL tablet Place 1 tablet (0.4 mg total) under the tongue every 5 (five) minutes as needed for chest pain. Patient taking differently: Place 0.4 mg under the tongue every 5 (five) minutes x 3 doses as needed for chest pain. 11/13/16 03/18/22 Yes Kilroy, Luke K, PA-C  ondansetron (ZOFRAN) 4 MG tablet Take 4 mg by mouth daily as needed for nausea or vomiting.   Yes [provider]  polyethylene glycol powder (GLYCOLAX/MIRALAX) 17 GM/SCOOP powder Take 17 g by mouth daily as needed (constipation).   Yes [provider]  rosuvastatin (CRESTOR) 20 MG tablet Take 1 tablet (20 mg total) by mouth every evening. NEED OV. Patient taking differently: Take 20 mg by mouth every evening. 10/23/21  Yes Croitoru, [name], MD  sucralfate (CARAFATE) 1 g tablet Take 1 tablet (1 g total) by  mouth 4 (four) times daily -  with meals and at bedtime. Patient taking differently: Take 1 g by mouth 4 (four) times daily -  before meals and at bedtime. 02/19/22  Yes Antonieta Pert, MD  vitamin B-12 (CYANOCOBALAMIN) 1000 MCG tablet Take 1 tablet (1,000 mcg total) by mouth  daily. 08/15/21  Yes Gherghe, Vella Redhead, MD  albuterol (PROVENTIL HFA;VENTOLIN HFA) 108 (90 Base) MCG/ACT inhaler Inhale 2 puffs into the lungs every 6 (six) hours as needed for wheezing or shortness of breath. 05/02/18   Norval Morton, MD  albuterol (PROVENTIL) (2.5 MG/3ML) 0.083% nebulizer solution Take 3 mLs (2.5 mg total) by nebulization every 6 (six) hours as needed for wheezing or shortness of breath. 05/02/18   Norval Morton, MD  furosemide (LASIX) 40 MG tablet Take 1 tablet (40 mg total) by mouth 2 (two) times daily. 5 day course. (3/84/53-6/46/80) 04/30/20   Delora Fuel, MD  furosemide (LASIX) 80 MG tablet Take 1 tablet (80 mg total) by mouth as needed for edema or fluid. Patient not taking: Reported on 02/27/2022 02/19/22   Antonieta Pert, MD  pantoprazole (PROTONIX) 40 MG tablet Take 1 tablet (40 mg total) by mouth 2 (two) times daily. Patient taking differently: Take 40 mg by mouth 2 (two) times daily. (0900, 2100) 07/08/19   Nandigam, Venia Minks, MD  potassium chloride SA (KLOR-CON M) 20 MEQ tablet Take 1 tablet (20 mEq total) by mouth 2 (two) times daily. 4/82/50   Delora Fuel, MD      VITAL SIGNS:  Blood pressure (!) 162/68, pulse 89, temperature 98.3 F (36.8 C), temperature source Oral, resp. rate 18, height '5\' 3"'$  (1.6 m), weight 85 kg, SpO2 100 %.  PHYSICAL EXAMINATION:  Physical Exam  GENERAL:  77 y.o.-year-old patient lying in the bed with no acute distress.  EYES: Pupils equal, round, reactive to light and accommodation. No scleral icterus. Extraocular muscles intact.  HEENT: Head atraumatic, normocephalic. Oropharynx and nasopharynx clear.  NECK:  Supple, no jugular venous distention. No thyroid enlargement,  no tenderness.  LUNGS: Normal breath sounds bilaterally, no wheezing, rales,rhonchi or crepitation. No use of accessory muscles of respiration.  CARDIOVASCULAR: Regular rate and rhythm, S1, S2 normal. No murmurs, rubs, or gallops.  ABDOMEN: Soft, nondistended, with epigastric and left upper quadrant abdominal tenderness without rebound tenderness guarding or rigidity.. Bowel sounds present. No organomegaly or mass.  EXTREMITIES: No pedal edema, cyanosis, or clubbing.  NEUROLOGIC: Cranial nerves II through XII are intact. Muscle strength 5/5 in all extremities. Sensation intact. Gait not checked.  PSYCHIATRIC: The patient is alert and oriented x 3.  Normal affect and good eye contact. SKIN: No obvious rash, lesion, or ulcer.   LABORATORY PANEL:   CBC Recent Labs  Lab 03/17/22 1545  WBC 8.0  HGB 7.9*  HCT 26.7*  PLT 180   ------------------------------------------------------------------------------------------------------------------  Chemistries  Recent Labs  Lab 03/17/22 1545  NA 138  K 2.9*  CL 90*  CO2 32  GLUCOSE 169*  BUN 45*  CREATININE 2.38*  CALCIUM 9.4  AST 25  ALT 18  ALKPHOS 173*  BILITOT 0.5   ------------------------------------------------------------------------------------------------------------------  Cardiac Enzymes No results for input(s): "TROPONINI" in the last 168 hours. ------------------------------------------------------------------------------------------------------------------  RADIOLOGY:  US Abdomen Limited RUQ (LIVER/GB)  Result Date: 03/17/2022 CLINICAL DATA:  History of prior cholecystectomy. Acute pancreatitis. EXAM: ULTRASOUND ABDOMEN LIMITED RIGHT UPPER QUADRANT COMPARISON:  None Available. FINDINGS: Gallbladder: Over gallbladder is surgically absent. Common bile duct: Diameter: 9.3 mm Liver: No focal lesion identified. Diffusely increased echogenicity of the liver parenchyma is noted. Portal vein is patent on color Doppler imaging  with normal direction of blood flow towards the liver. Other: Of incidental note is the presence of a 3.7 cm right renal cyst. IMPRESSION: 1. Findings consistent with history of prior cholecystectomy. 2. Hepatic  steatosis without focal liver lesions. 3. Right renal cyst which spine to the cystic lesion seen within the right kidney on the prior abdomen pelvis CT (March 17, 2022). Correlation with 6 week follow-up contrast-enhanced abdomen and pelvis CT is recommended for improved characterization and to determine stability. Electronically Signed   By: Virgina Norfolk M.D.   On: 03/17/2022 21:20   CT ABDOMEN PELVIS WO CONTRAST  Result Date: 03/17/2022 CLINICAL DATA:  Abdominal pain for 3 days. EXAM: CT ABDOMEN AND PELVIS WITHOUT CONTRAST TECHNIQUE: Multidetector CT imaging of the abdomen and pelvis was performed following the standard protocol without IV contrast. RADIATION DOSE REDUCTION: This exam was performed according to the departmental dose-optimization program which includes automated exposure control, adjustment of the mA and/or kV according to patient size and/or use of iterative reconstruction technique. COMPARISON:  CT, 09/09/2021 and 04/21/2021. FINDINGS: Lower chest: 4 mm nodule, right middle lobe, image 4, series 8. 5 mm nodule, right lower lobe, image 12/8. Tiny nodule, right lower lobe, image 19/8. These nodules are stable from 09/09/2021. Mild dependent lower lobe atelectasis. No acute findings. Hepatobiliary: Of several small low-attenuation liver lesions, largest at the dome of segment 7, 1 cm size, nonspecific, likely cysts. No other liver abnormality. Gallbladder surgically absent. No bile duct dilation. Pancreas: Unremarkable. No pancreatic ductal dilatation or surrounding inflammatory changes. Spleen: Normal in size without focal abnormality. Adrenals/Urinary Tract: No adrenal masses. Mild bilateral renal cortical thinning. Isoattenuating right renal masses, 1 protruding from the  posterolateral upper pole, 1.4 cm, the other protruding medially from the lower pole, 2 cm. Exophytic small cyst from the posterolateral midpole the left kidney, 1.3 cm. Right renal masses have both increased in size from the prior CT. Left renal cyst is without significant change. No new masses, no stones and no hydronephrosis. Normal ureters. Bladder mostly decompressed, otherwise unremarkable. Stomach/Bowel: Stomach mostly decompressed, otherwise unremarkable. Small bowel and colon are normal in caliber. No wall thickening. No inflammation. Scattered colonic diverticula most numerous on the left. Rectum moderately distended with stool. Vascular/Lymphatic: Dense aortic atherosclerotic calcifications extending to the branch vessels. No aneurysm. No enlarged lymph nodes. Reproductive: Uterus and bilateral adnexa are unremarkable. Other: No ascites. Musculoskeletal: No fracture or acute finding. Well-positioned left total hip arthroplasty. Partly imaged posterior fusion hardware from a thoracic spine fusion. Thoracic spine hardware is new since the prior study. Left hip arthroplasty replaces compression screw and intramedullary rod seen previously. IMPRESSION: 1. No acute findings within the abdomen or pelvis. 2. 2 isoattenuating right renal masses have increased in size from previous CT. Although these may still reflect complicated/hemorrhagic cysts, recommend further assessment with nonemergent renal ultrasound. 3. Small lung base nodules as detailed stable from the CT dated 09/09/2021, and from exam from 2019, benign with no follow-up recommended. 4. Aortic atherosclerosis. 5. Rectum moderately distended with stool. Electronically Signed   By: Lajean Manes M.D.   On: 03/17/2022 18:16   UE VENOUS DUPLEX (7am - 7pm)  Result Date: 03/17/2022 UPPER VENOUS STUDY  Patient Name:  HENNIE GOSA  Date of Exam:   03/17/2022 Medical Rec #: [id number]          Accession #:    [id number] Date of Birth: [birth year]            Patient Gender: F Patient Age:   59 years Exam Location:  [facility] Procedure:      VAS [place] UPPER EXTREMITY VENOUS DUPLEX Referring Phys: Herbie Baltimore LOCKWOOD --------------------------------------------------------------------------------  Indications: Pain, Edema, and numbness Limitations:  Body habitus,PM placement, and poor ultrasound/tissue interface. Comparison Study: No previous UEV exams Performing Technologist: Jody Hill RVT, RDMS  Examination Guidelines: A complete evaluation includes B-mode imaging, spectral Doppler, color Doppler, and power Doppler as needed of all accessible portions of each vessel. Bilateral testing is considered an integral part of a complete examination. Limited examinations for reoccurring indications may be performed as noted.  Right Findings: +----------+------------+---------+-----------+----------+--------------------+ RIGHT     CompressiblePhasicitySpontaneousProperties      Summary        +----------+------------+---------+-----------+----------+--------------------+ IJV           Full       Yes       Yes                                   +----------+------------+---------+-----------+----------+--------------------+ Subclavian               Yes       Yes                   patent by                                                              color/doppler     +----------+------------+---------+-----------+----------+--------------------+ Axillary      Full       Yes       Yes                                   +----------+------------+---------+-----------+----------+--------------------+ Brachial      Full       Yes       Yes                                   +----------+------------+---------+-----------+----------+--------------------+ Radial        Full                                                       +----------+------------+---------+-----------+----------+--------------------+ Cephalic      Full                                     patent by color    +----------+------------+---------+-----------+----------+--------------------+ Basilic       Full       Yes       Yes                                   +----------+------------+---------+-----------+----------+--------------------+ Limited visualization of ulnar veins - patent by color only  Left Findings: +----------+------------+---------+-----------+----------+--------------------+ LEFT      CompressiblePhasicitySpontaneousProperties      Summary        +----------+------------+---------+-----------+----------+--------------------+ Subclavian               Yes  Yes                   patent by                                                              color/doppler     +----------+------------+---------+-----------+----------+--------------------+  Summary:  Right: No evidence of deep vein thrombosis in the upper extremity. No evidence of superficial vein thrombosis in the upper extremity.  Left: No evidence of thrombosis in the subclavian.  *See table(s) above for measurements and observations.  Diagnosing physician: Servando Snare MD Electronically signed by Servando Snare MD on 03/17/2022 at 5:37:48 PM.    Final       IMPRESSION AND PLAN:  Assessment and Plan: * Acute pancreatitis - The patient will be admitted to a medical bed. - Pain management will be provided. - She will be kept n.p.o. except for medications. - We will follow serial lipase levels.  Hypokalemia - Potassium will be replaced and magnesium level will be checked.  Coronary artery disease - We will continue Imdur and Plavix and hold off aspirin for now. - We will resume as needed sublingual nitroglycerin.  GERD without esophagitis - We will continue PPI therapy and Carafate.  Dyslipidemia - We will check fasting lipids to assess triglycerides. - We will continue statin therapy.  Atrial fibrillation ([place]) - We will continue Eliquis and  Toprol-XL. -We will check EKG.   DVT prophylaxis: Eliquis.   Advanced Care Planning:  Code Status: full code.  Family Communication:  The plan of care was discussed in details with the patient (and family). I answered all questions. The patient agreed to proceed with the above mentioned plan. Further management will depend upon hospital course. Disposition Plan: Back to previous home environment Consults called: none.  All the records are reviewed and case discussed with ED provider.  Status is: Inpatient  At the time of the admission, it appears that the appropriate admission status for this patient is inpatient.  This is judged to be reasonable and necessary in order to provide the required intensity of service to ensure the patient's safety given the presenting symptoms, physical exam findings and initial radiographic and laboratory data in the context of comorbid conditions.  The patient requires inpatient status due to high intensity of service, high risk of further deterioration and high frequency of surveillance required.  I certify that at the time of admission, it is my clinical judgment that the patient will require inpatient hospital care extending more than 2 midnights.                            Dispo: The patient is from: Home              Anticipated d/c is to: Home              Patient currently is not medically stable to d/c.              Difficult to place patient: No  Christel Mormon M.D on 03/17/2022 at 11:35 PM  [facility]   From 7 PM-7 AM, contact night-coverage [email or web address]  CC: Primary care physician; Lujean Amel, MD

## 2022-03-17 NOTE — Assessment & Plan Note (Signed)
-   The patient will be admitted to a medical bed. - Pain management will be provided. - She will be kept n.p.o. except for medications. - We will follow serial lipase levels.

## 2022-03-17 NOTE — Progress Notes (Signed)
RUE venous duplex has been completed.  Preliminary results given to Dr. Vanita Panda.   Results can be found under chart review under CV PROC. 03/17/2022 4:53 PM Shaheen Mende RVT, RDMS

## 2022-03-17 NOTE — Assessment & Plan Note (Signed)
-   Potassium will be replaced and magnesium level will be checked. ?

## 2022-03-17 NOTE — ED Triage Notes (Signed)
Pt from [facility] for eval of three days of intermittent, generalized abdominal pain. Denies n/v/d. SNF staff administered gas-x and a GI cocktail without any relief.

## 2022-03-17 NOTE — ED Notes (Signed)
ED TO INPATIENT HANDOFF REPORT  ED Nurse Name and Phone #: Albina Billet [phone]  S Name/Age/Gender [name] 77 y.o. female Room/Bed: H015C/H015C  Code Status   Code Status: Full Code  Home/SNF/Other Rehab Patient oriented to: self, place, time, and situation Is this baseline? Yes   Triage Complete: Triage complete  Chief Complaint Acute pancreatitis [K85.90]  Triage Note Pt from [facility] for eval of three days of intermittent, generalized abdominal pain. Denies n/v/d. SNF staff administered gas-x and a GI cocktail without any relief.    Allergies Allergies  Allergen Reactions   Ivp Dye [Iodinated Contrast Media] Shortness Of Breath    No reaction to PO contrast with non-ionic dye.06-25-2014/rsm   Shellfish Allergy Anaphylaxis   Sulfa Antibiotics Shortness Of Breath   Iodine Hives   Benadryl [Diphenhydramine] Other (See Comments)    "THIS DRIVES ME CRAZY AND MAKES ME FEEL LIKE I AM DYING"    Lipitor [Atorvastatin] Other (See Comments)    Myalgias    Mitigare [Colchicine] Nausea And Vomiting   Uloric [Febuxostat] Other (See Comments)    Unknown reaction   Vibra-Tab [Doxycycline] Other (See Comments)    Unknown reaction   Keflex [Cephalexin] Itching and Rash   Zithromax [Azithromycin] Rash    Level of Care/Admitting Diagnosis ED Disposition   ED Disposition: Admit Condition: None Comment: Hospital Area: [place] [[id number]]  Level of Care: Med-Surg [16]  May admit patient to [facility] or [facility] if equivalent level of care is available:: No  Covid Evaluation: Asymptomatic - no recent exposure (last 10 days) testing not required  Diagnosis: Acute pancreatitis [577.0.ICD-9-CM]  Admitting Physician: Christel Mormon [[id number]]  Attending Physician: Christel Mormon [[id number]]  Certification:: I certify this patient will need inpatient services for at least 2 midnights  Estimated Length of Stay: 2      B Medical/Surgery History Past Medical  History:  Diagnosis Date   AICD (automatic cardioverter/defibrillator) present    downgraded to CRT-P in 2014   Anemia    Arthritis    Asthma    Atrial fibrillation ([place]) 10/24/2015   Questionable history of A Fib/A Flutter. On device interrogation on 9/7, showed 0.50mn of A Fib/A Flutter with 1% burden.  Device check in Jan 2018 showed no sustained AF (runs of less than 30 seconds). No anticoagulation indicated.   CAD (coronary artery disease)    a. 10/09/16 LHC: SVG->LAD patent, SVG->Diag patent, SVG->RCA patent, SVG->LCx occluded. EF 60%, b. 10/31/16 LHC DES to AV groove Circ, DES to intermed branch   Chronic lower back pain    Fatty liver disease, nonalcoholic    GERD (gastroesophageal reflux disease)    Gout    H/O hiatal hernia    High cholesterol    Hyperplastic colon polyp    Hypertension    IBS (irritable bowel syndrome)    Internal hemorrhoids    INTERNAL HEMORRHOIDS WITHOUT MENTION COMP 04/12/2007   Qualifier: Diagnosis of  By: BOlevia PerchesMD, Dora M    Nonischemic cardiomyopathy ([place] 11/22/2011   Pt responded to BiV ICD- last EF 55-60% Sept 2017   Obesity    OSA (obstructive sleep apnea)    "can't tolerate a mask", wears 2L nocturnal O2   Presence of permanent cardiac pacemaker    11/02/12 [employer] V273 INTUA PPM   Stroke ([facility] 09/2020   [place] II diabetes mellitus [place]    Past Surgical History:  Procedure Laterality Date   ABDOMINAL ULTRASOUND  12/01/2011  Peripelvic cysts- #1- 2.4x1.9x2.3cm, #2-1.2x0.9x1.2cm   ANKLE FRACTURE SURGERY Right    "had rod put in"   ANTERIOR CERVICAL DECOMP/DISCECTOMY FUSION     APPENDECTOMY     BACK SURGERY     BIOPSY  12/29/2017   Procedure: BIOPSY;  Surgeon: [name], MD;  [facility];  Service: Endoscopy;;   BIV ICD INSERTION CRT-D  2001?   BIV PACEMAKER GENERATOR CHANGE OUT N/A 11/02/2012   Procedure: BIV PACEMAKER GENERATOR CHANGE OUT;  Surgeon: Sanda Klein, MD;  Location: [place] CATH LAB;  Service:  Cardiovascular;  Laterality: N/A;   CARDIAC CATHETERIZATION  05/17/1999   No significant coronary obstructive disease w/ mild 20% luminal irregularity of the first diag branch of the LAD   CARDIAC CATHETERIZATION  07/08/2002   No significant CAD, moderately depressed LV systolic function   CARDIAC CATHETERIZATION Bilateral 04/26/2007   Normal findings, recommend medical therapy   CARDIAC CATHETERIZATION  02/18/2008   Moderate CAD, would benefit from having a functional study, recommend continue medical therapy   CARDIAC CATHETERIZATION  07/23/2012   Medical therapy   CARDIAC CATHETERIZATION N/A 11/24/2014   Procedure: Left Heart Cath and Coronary Angiography;  Surgeon: [place] Sine, MD;  Location: [place] CV LAB;  Service: Cardiovascular;  Laterality: N/A;   CARDIAC CATHETERIZATION  11/27/2014   Procedure: Intravascular Pressure Wire/FFR Study;  Surgeon: Peter M Martinique, MD;  Location: [place] CV LAB;  Service: Cardiovascular;;   CARDIAC CATHETERIZATION  10/09/2016   CHOLECYSTECTOMY N/A 04/09/2018   Procedure: LAPAROSCOPIC CHOLECYSTECTOMY;  Surgeon: Greer Pickerel, MD;  [facility];  Service: General;  Laterality: N/A;   COLONOSCOPY WITH PROPOFOL N/A 12/29/2017   Procedure: COLONOSCOPY WITH PROPOFOL;  Surgeon: [name], MD;  [facility];  Service: Endoscopy;  Laterality: N/A;   CONVERSION TO TOTAL HIP Left 02/12/2022   Procedure: NAIL REMOVAL WITH CONVERSION TO TOTAL HIP;  Surgeon: Willaim Sheng, MD;  Location: [place];  Service: Orthopedics;  Laterality: Left;   CORONARY ANGIOGRAM  2010   CORONARY ARTERY BYPASS GRAFT N/A 11/29/2014   Procedure: CORONARY ARTERY BYPASS GRAFTING x 5 (LIMA-LAD, SVG-D, SVG-OM1-OM2, SVG-PD);  Surgeon: Melrose Nakayama, MD;  Location: [place];  Service: Open Heart Surgery;  Laterality: N/A;   CORONARY STENT INTERVENTION N/A 10/31/2016   Procedure: CORONARY STENT INTERVENTION;  Surgeon: Burnell Blanks, MD;  Location: [place] CV LAB;  Service: Cardiovascular;  Laterality: N/A;   ESOPHAGOGASTRODUODENOSCOPY (EGD) WITH PROPOFOL N/A 12/29/2017   Procedure: ESOPHAGOGASTRODUODENOSCOPY (EGD) WITH PROPOFOL;  Surgeon: [name], MD;  [facility];  Service: Endoscopy;  Laterality: N/A;   FRACTURE SURGERY     INSERT / REPLACE / REMOVE PACEMAKER  1999   INTRAMEDULLARY (IM) NAIL INTERTROCHANTERIC Left 04/04/2021   Procedure: INTRAMEDULLARY (IM) NAIL INTERTROCHANTRIC;  Surgeon: Vanetta Mulders, MD;  Location: [place];  Service: Orthopedics;  Laterality: Left;   IR FLUORO GUIDE CV LINE LEFT  04/11/2021   IR US GUIDE VASC ACCESS LEFT  04/11/2021   KNEE ARTHROSCOPY Bilateral    LEFT HEART CATH AND CORS/GRAFTS ANGIOGRAPHY N/A 12/09/2017   Procedure: LEFT HEART CATH AND CORS/GRAFTS ANGIOGRAPHY;  Surgeon: [place] Sine, MD;  Location: [place] CV LAB;  Service: Cardiovascular;  Laterality: N/A;   LEFT HEART CATHETERIZATION WITH CORONARY ANGIOGRAM N/A 07/23/2012   Procedure: LEFT HEART CATHETERIZATION WITH CORONARY ANGIOGRAM;  Surgeon: Leonie Man, MD;  Location: [facility];  Service: Cardiovascular;  Laterality: N/A;   Leane Call  11/14/2011   Mild-moderate defect seen in Mid Inferolateral and Mid Anterolateral regions-consistant w/ infarct/scar. No significant ischemia demonstrated.   LUMBAR PERCUTANEOUS PEDICLE SCREW 4 LEVEL N/A 09/10/2021   Procedure: THORACIC SEVEN THROUGH THORACIC ELEVEN  PERCUTANEOUS PEDICLE SCREW PLACEMENT;  Surgeon: Judith Part, MD;  Location: [place];  Service: Neurosurgery;  Laterality: N/A;   POLYPECTOMY  12/29/2017   Procedure: POLYPECTOMY;  Surgeon: [name], MD;  [facility];  Service: Endoscopy;;   PPM GENERATOR CHANGEOUT N/A 12/31/2020   Procedure: PPM GENERATOR CHANGEOUT;  Surgeon: Sanda Klein, MD;  Location: [place] CV LAB;  Service: Cardiovascular;  Laterality: N/A;   RIGHT/LEFT HEART CATH AND CORONARY ANGIOGRAPHY N/A 10/09/2016    Procedure: RIGHT/LEFT HEART CATH AND CORONARY ANGIOGRAPHY;  Surgeon: Jolaine Artist, MD;  Location: [place] CV LAB;  Service: Cardiovascular;  Laterality: N/A;   RIGHT/LEFT HEART CATH AND CORONARY/GRAFT ANGIOGRAPHY N/A 03/11/2019   Procedure: RIGHT/LEFT HEART CATH AND CORONARY/GRAFT ANGIOGRAPHY;  Surgeon: Jolaine Artist, MD;  Location: [place] CV LAB;  Service: Cardiovascular;  Laterality: N/A;   TEE WITHOUT CARDIOVERSION N/A 11/29/2014   Procedure: TRANSESOPHAGEAL ECHOCARDIOGRAM (TEE);  Surgeon: Melrose Nakayama, MD;  Location: [place];  Service: Open Heart Surgery;  Laterality: N/A;   TRANSCAROTID ARTERY REVASCULARIZATION  Right 10/01/2020   Procedure: RIGHT TRANSCAROTID ARTERY REVASCULARIZATION;  Surgeon: [name], MD;  Location: [place];  Service: Vascular;  Laterality: Right;   TRANSTHORACIC ECHOCARDIOGRAM  07/23/2012   EF 55-60%, normal-mild   TUBAL LIGATION     ULTRASOUND GUIDANCE FOR VASCULAR ACCESS Left 10/01/2020   Procedure: ULTRASOUND GUIDANCE FOR VASCULAR ACCESS;  Surgeon: [name], MD;  Location: [place];  Service: Vascular;  Laterality: Left;     A IV Location/Drains/Wounds Patient Lines/Drains/Airways Status     Active Line/Drains/Airways     Name Placement date Placement time Site Days   Peripheral IV 03/17/22 22 G Left;Posterior Forearm 03/17/22  1538  Forearm  less than 1   External Urinary Catheter 03/04/22  0458  no documentation  13   Incision - 4 Ports Abdomen Right;Lateral Right;Medial Mid;Upper Mid;Medial 04/09/18  1230  no documentation 1438   Pressure Injury 04/13/21 Face Left;Upper Stage 1 -  Intact skin with non-blanchable redness of a localized area usually over a bony prominence. 1x1 from bipap mask 04/13/21  1200  no documentation 338   Pressure Injury 04/13/21 Sacrum Mid Deep Tissue Pressure Injury - Purple or maroon localized area of discolored intact skin or blood-filled blister due to damage of underlying soft tissue  from pressure and/or shear. Dark purple, not blancheable- possibly 04/13/21  2000  no documentation 338   Pressure Injury 11/01/21 Sacrum Medial Stage 2 -  Partial thickness loss of dermis presenting as a shallow open injury with a red, pink wound bed without slough. fissure and associated area of superficial skin breakdown over sacrum 11/01/21  1434  no documentation 136   Wound / Incision (Open or Dehisced) 04/03/21 (MASD) Moisture Associated Skin Damage Perineum Right;Left;Anterior;Posterior red, irritated 04/03/21  1711  Perineum  348   Wound / Incision (Open or Dehisced) 09/12/21 Arm Anterior;Left;Lower 09/12/21  0700  Arm  186   Wound / Incision (Open or Dehisced) 11/01/21 Non-pressure wound Pretibial Right serous filled blister 11/01/21  1433  Pretibial  136            Intake/Output Last 24 hours No intake or output data in the 24 hours ending 03/17/22 2013  Labs/Imaging Results  for orders placed or performed during the hospital encounter of 03/17/22 (from the past 48 hour(s))  CBC with Differential     Status: Abnormal   Collection Time: 03/17/22  3:45 PM  Result Value Ref Range   WBC 8.0 4.0 - 10.5 K/uL   RBC 2.74 (L) 3.87 - 5.11 MIL/uL   Hemoglobin 7.9 (L) 12.0 - 15.0 g/dL   HCT 26.7 (L) 36.0 - 46.0 %   MCV 97.4 80.0 - 100.0 fL   MCH 28.8 26.0 - 34.0 pg   MCHC 29.6 (L) 30.0 - 36.0 g/dL   RDW 15.1 11.5 - 15.5 %   Platelets 180 150 - 400 K/uL   nRBC 0.0 0.0 - 0.2 %   Neutrophils Relative % 70 %   Neutro Abs 5.7 1.7 - 7.7 K/uL   Lymphocytes Relative 15 %   Lymphs Abs 1.2 0.7 - 4.0 K/uL   Monocytes Relative 8 %   Monocytes Absolute 0.7 0.1 - 1.0 K/uL   Eosinophils Relative 5 %   Eosinophils Absolute 0.4 0.0 - 0.5 K/uL   Basophils Relative 1 %   Basophils Absolute 0.0 0.0 - 0.1 K/uL   Immature Granulocytes 1 %   Abs Immature Granulocytes 0.04 0.00 - 0.07 K/uL    Comment: Performed at [facility], [address] [place], [place] [address]  Comprehensive  metabolic panel     Status: Abnormal   Collection Time: 03/17/22  3:45 PM  Result Value Ref Range   Sodium 138 135 - 145 mmol/L   Potassium 2.9 (L) 3.5 - 5.1 mmol/L   Chloride 90 (L) 98 - 111 mmol/L   CO2 32 22 - 32 mmol/L   Glucose, Bld 169 (H) 70 - 99 mg/dL    Comment: Glucose reference range applies only to samples taken after fasting for at least 8 hours.   BUN 45 (H) 8 - 23 mg/dL   Creatinine, Ser 2.38 (H) 0.44 - 1.00 mg/dL   Calcium 9.4 8.9 - 10.3 mg/dL   Total Protein 6.1 (L) 6.5 - 8.1 g/dL   Albumin 2.6 (L) 3.5 - 5.0 g/dL   AST 25 15 - 41 U/L   ALT 18 0 - 44 U/L   Alkaline Phosphatase 173 (H) 38 - 126 U/L   Total Bilirubin 0.5 0.3 - 1.2 mg/dL   GFR, Estimated 20 (L) >60 mL/min    Comment: (NOTE) Calculated using the CKD-EPI Creatinine Equation (2021)    Anion gap 16 (H) 5 - 15    Comment: Performed at [facility], [place] [address], [place], [place] [address]  Lipase, blood     Status: Abnormal   Collection Time: 03/17/22  3:45 PM  Result Value Ref Range   Lipase 1,308 (H) 11 - 51 U/L    Comment: RESULT CONFIRMED BY MANUAL DILUTION Performed at [facility], [place] [address] [place], [place] [address]    *Note: Due to a large number of results and/or encounters for the requested time period, some results have not been displayed. A complete set of results can be found in Results Review.   CT ABDOMEN PELVIS WO CONTRAST  Result Date: 03/17/2022 CLINICAL DATA:  Abdominal pain for 3 days. EXAM: CT ABDOMEN AND PELVIS WITHOUT CONTRAST TECHNIQUE: Multidetector CT imaging of the abdomen and pelvis was performed following the standard protocol without IV contrast. RADIATION DOSE REDUCTION: This exam was performed according to the departmental dose-optimization program which includes automated exposure control, adjustment of the  mA and/or kV according to patient size and/or use of iterative reconstruction technique. COMPARISON:  CT, 09/09/2021 and 04/21/2021. FINDINGS: Lower  chest: 4 mm nodule, right middle lobe, image 4, series 8. 5 mm nodule, right lower lobe, image 12/8. Tiny nodule, right lower lobe, image 19/8. These nodules are stable from 09/09/2021. Mild dependent lower lobe atelectasis. No acute findings. Hepatobiliary: Of several small low-attenuation liver lesions, largest at the dome of segment 7, 1 cm size, nonspecific, likely cysts. No other liver abnormality. Gallbladder surgically absent. No bile duct dilation. Pancreas: Unremarkable. No pancreatic ductal dilatation or surrounding inflammatory changes. Spleen: Normal in size without focal abnormality. Adrenals/Urinary Tract: No adrenal masses. Mild bilateral renal cortical thinning. Isoattenuating right renal masses, 1 protruding from the posterolateral upper pole, 1.4 cm, the other protruding medially from the lower pole, 2 cm. Exophytic small cyst from the posterolateral midpole the left kidney, 1.3 cm. Right renal masses have both increased in size from the prior CT. Left renal cyst is without significant change. No new masses, no stones and no hydronephrosis. Normal ureters. Bladder mostly decompressed, otherwise unremarkable. Stomach/Bowel: Stomach mostly decompressed, otherwise unremarkable. Small bowel and colon are normal in caliber. No wall thickening. No inflammation. Scattered colonic diverticula most numerous on the left. Rectum moderately distended with stool. Vascular/Lymphatic: Dense aortic atherosclerotic calcifications extending to the branch vessels. No aneurysm. No enlarged lymph nodes. Reproductive: Uterus and bilateral adnexa are unremarkable. Other: No ascites. Musculoskeletal: No fracture or acute finding. Well-positioned left total hip arthroplasty. Partly imaged posterior fusion hardware from a thoracic spine fusion. Thoracic spine hardware is new since the prior study. Left hip arthroplasty replaces compression screw and intramedullary rod seen previously. IMPRESSION: 1. No acute findings within  the abdomen or pelvis. 2. 2 isoattenuating right renal masses have increased in size from previous CT. Although these may still reflect complicated/hemorrhagic cysts, recommend further assessment with nonemergent renal ultrasound. 3. Small lung base nodules as detailed stable from the CT dated 09/09/2021, and from exam from 2019, benign with no follow-up recommended. 4. Aortic atherosclerosis. 5. Rectum moderately distended with stool. Electronically Signed   By: Lajean Manes M.D.   On: 03/17/2022 18:16   UE VENOUS DUPLEX (7am - 7pm)  Result Date: 03/17/2022 UPPER VENOUS STUDY  Patient Name:  [name]  Date of Exam:   03/17/2022 Medical Rec #: [id number]          Accession #:    [id number] Date of Birth: 1946-01-22           Patient Gender: F Patient Age:   26 years Exam Location:  [facility] Procedure:      VAS [place] UPPER EXTREMITY VENOUS DUPLEX Referring Phys: Herbie Baltimore LOCKWOOD --------------------------------------------------------------------------------  Indications: Pain, Edema, and numbness Limitations: Body habitus,PM placement, and poor ultrasound/tissue interface. Comparison Study: No previous UEV exams Performing Technologist: Jody Hill RVT, RDMS  Examination Guidelines: A complete evaluation includes B-mode imaging, spectral Doppler, color Doppler, and power Doppler as needed of all accessible portions of each vessel. Bilateral testing is considered an integral part of a complete examination. Limited examinations for reoccurring indications may be performed as noted.  Right Findings: +----------+------------+---------+-----------+----------+--------------------+ RIGHT     CompressiblePhasicitySpontaneousProperties      Summary        +----------+------------+---------+-----------+----------+--------------------+ IJV           Full       Yes       Yes                                   +----------+------------+---------+-----------+----------+--------------------+  Subclavian                Yes       Yes                   patent by                                                              color/doppler     +----------+------------+---------+-----------+----------+--------------------+ Axillary      Full       Yes       Yes                                   +----------+------------+---------+-----------+----------+--------------------+ Brachial      Full       Yes       Yes                                   +----------+------------+---------+-----------+----------+--------------------+ Radial        Full                                                       +----------+------------+---------+-----------+----------+--------------------+ Cephalic      Full                                    patent by color    +----------+------------+---------+-----------+----------+--------------------+ Basilic       Full       Yes       Yes                                   +----------+------------+---------+-----------+----------+--------------------+ Limited visualization of ulnar veins - patent by color only  Left Findings: +----------+------------+---------+-----------+----------+--------------------+ LEFT      CompressiblePhasicitySpontaneousProperties      Summary        +----------+------------+---------+-----------+----------+--------------------+ Subclavian               Yes       Yes                   patent by                                                              color/doppler     +----------+------------+---------+-----------+----------+--------------------+  Summary:  Right: No evidence of deep vein thrombosis in the upper extremity. No evidence of superficial vein thrombosis in the upper extremity.  Left: No evidence of thrombosis in the subclavian.  *See table(s) above for measurements and observations.  Diagnosing physician: Servando Snare MD Electronically signed by Servando Snare MD on 03/17/2022 at 5:37:48 PM.    Final  Pending Labs Unresulted Labs (From admission, onward)     Start     Ordered   03/18/22 1655  Basic metabolic panel  Tomorrow morning,   R        03/17/22 2001   03/18/22 0500  CBC  Tomorrow morning,   R        03/17/22 2001   03/17/22 1430  Urinalysis, Routine w reflex microscopic -Urine, Clean Catch  (ED Abdominal Pain)  Once,   URGENT       Question:  Specimen Source  Answer:  Urine, Clean Catch   03/17/22 1429            Vitals/Pain Today's Vitals   03/17/22 1436 03/17/22 1437 03/17/22 1612  BP: 139/72    Pulse: 78    Resp: 20    Temp: 98.3 F (36.8 C)    TempSrc: Oral    SpO2: 100%    Weight:   85 kg  Height:   '5\' 3"'$  (1.6 m)  PainSc:  6      Isolation Precautions No active isolations  Medications Medications  0.9 % NaCl with KCl 20 mEq/ L  infusion ( Intravenous New Bag/Given 03/17/22 2013)  enoxaparin (LOVENOX) injection 30 mg (has no administration in time range)  acetaminophen (TYLENOL) tablet 650 mg (has no administration in time range)    Or  acetaminophen (TYLENOL) suppository 650 mg (has no administration in time range)  traZODone (DESYREL) tablet 25 mg (has no administration in time range)  magnesium hydroxide (MILK OF MAGNESIA) suspension 30 mL (has no administration in time range)  ondansetron (ZOFRAN) tablet 4 mg (has no administration in time range)    Or  ondansetron (ZOFRAN) injection 4 mg (has no administration in time range)  morphine (PF) 2 MG/ML injection 2 mg (has no administration in time range)  0.9 % NaCl with KCl 20 mEq/ L  infusion (has no administration in time range)  potassium chloride SA (KLOR-CON M) CR tablet 40 mEq (has no administration in time range)  ketorolac (TORADOL) 15 MG/ML injection 15 mg (has no administration in time range)  HYDROmorphone (DILAUDID) injection 1 mg (1 mg Intravenous Given 03/17/22 2005)    Mobility walks with device     Focused Assessments Gastrointestinal   R Recommendations: See Admitting  Provider Note  Report given to:   Additional Notes: A&Ox4. Recent hip replacement

## 2022-03-17 NOTE — ED Notes (Signed)
Patient transported to vascular ultrasound

## 2022-03-17 NOTE — ED Provider Notes (Signed)
[facility] Provider Note   CSN: [id number] Arrival date & time: 03/17/22  1426     History  Chief Complaint  Patient presents with   Abdominal Pain    [name] is a 77 y.o. female.  HPI Presents from her nursing facility with staff concern and personal concerns of abdominal pain.  She notes that since her discharge from this facility about 1 month ago she has had ongoing episodic abdominal pain, now more severe over the past 3 days without nausea, vomiting, diarrhea, polyuria or dysuria.  Staff provided GI cocktail, simethicone, with no relief.    Home Medications Prior to Admission medications   Medication Sig Start Date End Date Taking? Authorizing Provider  acetaminophen (TYLENOL) 500 MG tablet Take 1,000 mg by mouth 3 (three) times daily as needed for headache (pain).    [provider]  albuterol (PROVENTIL HFA;VENTOLIN HFA) 108 (90 Base) MCG/ACT inhaler Inhale 2 puffs into the lungs every 6 (six) hours as needed for wheezing or shortness of breath. 05/02/18   Norval Morton, MD  albuterol (PROVENTIL) (2.5 MG/3ML) 0.083% nebulizer solution Take 3 mLs (2.5 mg total) by nebulization every 6 (six) hours as needed for wheezing or shortness of breath. 05/02/18   Norval Morton, MD  allopurinol (ZYLOPRIM) 100 MG tablet Take 1 tablet (100 mg total) by mouth daily. Please contact the office to schedule appointment for additional refills. 1st Attempt. Patient taking differently: Take 100 mg by mouth in the morning. (0900) 12/05/21   Croitoru, [name], MD  aspirin EC 81 MG tablet Take 1 tablet (81 mg total) by mouth daily. Patient taking differently: Take 81 mg by mouth in the morning. (0900) 03/27/16   Erlene Quan, PA-C  budesonide-formoterol [facility]) 160-4.5 MCG/ACT inhaler Inhale 2 puffs into the lungs 2 (two) times daily. (0900, 2100)    [provider]  Carboxymethylcellulose Sod PF (REFRESH PLUS) 0.5 % SOLN  Place 1 drop into both eyes 3 (three) times daily as needed (dry eyes).    [provider]  clopidogrel (PLAVIX) 75 MG tablet Take 1 tablet (75 mg total) by mouth daily. Patient taking differently: Take 75 mg by mouth in the morning. (0900) 10/23/21   Croitoru, Dani Gobble, MD  cycloSPORINE (RESTASIS) 0.05 % ophthalmic emulsion Place 1 drop into both eyes 2 (two) times daily. (0900, 2100)    [provider]  ferrous sulfate 325 (65 FE) MG tablet Take 1 tablet (325 mg total) by mouth daily with breakfast. Patient taking differently: Take 325 mg by mouth daily with breakfast. (0900) 02/19/22 03/21/22  Antonieta Pert, MD  fluticasone (FLONASE) 50 MCG/ACT nasal spray Place 1 spray into both nostrils daily as needed for allergies.    [provider]  folic acid (FOLVITE) 1 MG tablet Take 1 tablet (1 mg total) by mouth daily. Patient taking differently: Take 1 mg by mouth in the morning. (0900) 02/19/22 03/21/22  Antonieta Pert, MD  furosemide (LASIX) 40 MG tablet Take 1 tablet (40 mg total) by mouth 2 (two) times daily. 5 day course. (1/49/70-2/63/78) 5/88/50   Delora Fuel, MD  furosemide (LASIX) 80 MG tablet Take 1 tablet (80 mg total) by mouth as needed for edema or fluid. Patient not taking: Reported on 02/27/2022 02/19/22   Antonieta Pert, MD  hydrOXYzine (ATARAX) 25 MG tablet Take 25 mg by mouth 2 (two) times daily as needed for itching.    [provider]  isosorbide mononitrate (IMDUR)  30 MG 24 hr tablet Take 3 tablets (90 mg total) by mouth daily. Patient taking differently: Take 90 mg by mouth in the morning. (0900) 10/23/21   Croitoru, [name], MD  Lido-PE-Glycerin-Petrolatum (PREPARATION H RAPID RELIEF EX) Place 1 spray rectally every 6 (six) hours as needed (hemorrhoids).    [provider]  lidocaine 4 % Place 1 patch onto the skin daily as needed (pain).    [provider]  linaclotide (LINZESS) 290 MCG CAPS capsule Take 290 mcg by mouth daily before breakfast.  (0600)    [provider]  methocarbamol (ROBAXIN) 500 MG tablet Take 500 mg by mouth every 6 (six) hours as needed for muscle spasms.    [provider]  nitroGLYCERIN (NITROSTAT) 0.4 MG SL tablet Place 1 tablet (0.4 mg total) under the tongue every 5 (five) minutes as needed for chest pain. Patient taking differently: Place 0.4 mg under the tongue every 5 (five) minutes x 3 doses as needed for chest pain. 11/13/16 03/18/22  Erlene Quan, PA-C  ondansetron (ZOFRAN) 4 MG tablet Take 4 mg by mouth daily as needed for nausea or vomiting.    [provider]  oxyCODONE (OXY IR/ROXICODONE) 5 MG immediate release tablet Take 5 mg by mouth every 4 (four) hours as needed for moderate pain.    [provider]  Oxycodone HCl 10 MG TABS Take 10 mg by mouth every 4 (four) hours as needed (severe pain).    [provider]  OXYGEN Inhale 2-3 L/min into the lungs continuous.    [provider]  oxymetazoline (AFRIN) 0.05 % nasal spray Place 1 spray into both nostrils 2 (two) times daily as needed for congestion.    [provider]  pantoprazole (PROTONIX) 40 MG tablet Take 1 tablet (40 mg total) by mouth 2 (two) times daily. Patient taking differently: Take 40 mg by mouth 2 (two) times daily. (0900, 2100) 07/08/19   Nandigam, Venia Minks, MD  polyethylene glycol powder (GLYCOLAX/MIRALAX) 17 GM/SCOOP powder Take 17 g by mouth daily as needed (constipation).    [provider]  potassium chloride SA (KLOR-CON M) 20 MEQ tablet Take 1 tablet (20 mEq total) by mouth 2 (two) times daily. 9/56/21   Delora Fuel, MD  rosuvastatin (CRESTOR) 20 MG tablet Take 1 tablet (20 mg total) by mouth every evening. NEED OV. Patient taking differently: Take 20 mg by mouth every evening. (2100) 10/23/21   Croitoru, Dani Gobble, MD  simethicone (MYLICON) 80 MG chewable tablet Chew 80 mg by mouth 4 (four) times daily as needed for flatulence.    [provider]   sucralfate (CARAFATE) 1 g tablet Take 1 tablet (1 g total) by mouth 4 (four) times daily -  with meals and at bedtime. Patient taking differently: Take 1 g by mouth 4 (four) times daily -  before meals and at bedtime. (0800, 1200, 1600, 2100) 02/19/22   Antonieta Pert, MD  vitamin B-12 (CYANOCOBALAMIN) 1000 MCG tablet Take 1 tablet (1,000 mcg total) by mouth daily. Patient taking differently: Take 1,000 mcg by mouth in the morning. (0900) 08/15/21   Caren Griffins, MD      Allergies    Ivp dye [iodinated contrast media], Shellfish allergy, Sulfa antibiotics, Iodine, Benadryl [diphenhydramine], Lipitor [atorvastatin], Mitigare [colchicine], Uloric [febuxostat], Vibra-tab [doxycycline], Keflex [cephalexin], and Zithromax [azithromycin]    Review of Systems   Review of Systems  All other systems reviewed and are negative.   Physical Exam Updated Vital Signs BP 139/72 (  BP Location: Left Arm)   Pulse 78   Temp 98.3 F (36.8 C) (Oral)   Resp 20   Ht '5\' 3"'$  (1.6 m)   Wt 85 kg   SpO2 100%   BMI 33.19 kg/m  Physical Exam Vitals and nursing note reviewed.  Constitutional:      General: She is not in acute distress.    Appearance: She is well-developed. She is obese. She is ill-appearing. She is not toxic-appearing.  HENT:     Head: Normocephalic and atraumatic.  Eyes:     Conjunctiva/sclera: Conjunctivae normal.  Cardiovascular:     Rate and Rhythm: Normal rate and regular rhythm.  Pulmonary:     Effort: Pulmonary effort is normal. No respiratory distress.     Breath sounds: Normal breath sounds. No stridor.  Abdominal:     General: There is no distension.     Tenderness: There is generalized abdominal tenderness.  Skin:    General: Skin is warm and dry.  Neurological:     Mental Status: She is alert and oriented to person, place, and time.     Cranial Nerves: No cranial nerve deficit.  Psychiatric:        Mood and Affect: Mood normal.     ED Results / Procedures / Treatments    Labs (all labs ordered are listed, but only abnormal results are displayed) Labs Reviewed  CBC WITH DIFFERENTIAL/PLATELET - Abnormal; Notable for the following components:      Result Value   RBC 2.74 (*)    Hemoglobin 7.9 (*)    HCT 26.7 (*)    MCHC 29.6 (*)    All other components within normal limits  COMPREHENSIVE METABOLIC PANEL - Abnormal; Notable for the following components:   Potassium 2.9 (*)    Chloride 90 (*)    Glucose, Bld 169 (*)    BUN 45 (*)    Creatinine, Ser 2.38 (*)    Total Protein 6.1 (*)    Albumin 2.6 (*)    Alkaline Phosphatase 173 (*)    GFR, Estimated 20 (*)    Anion gap 16 (*)    All other components within normal limits  LIPASE, BLOOD  URINALYSIS, ROUTINE W REFLEX MICROSCOPIC    EKG None  Radiology No results found.  Procedures Procedures    Medications Ordered in ED Medications - No data to display  ED Course/ Medical Decision Making/ A&P                             Medical Decision Making Adult female with multiple medical problems including obesity, hypertension presents with abdominal pain for several days.  Differential including GU, GI pathology, less likely SBO.  CT ordered labs sent patient received fluids, narcotics  Amount and/or Complexity of Data Reviewed External Data Reviewed: notes.    Details: Admit in Dec Labs: ordered. Decision-making details documented in ED Course. Radiology: ordered and independent interpretation performed. Decision-making details documented in ED Course.  Risk Prescription drug management. Decision regarding hospitalization.   8:17 PM Patient has received multiple doses of pain medication, and now I discussed CT and lab results with her.  Findings most notable for abnormalities consistent with acute pancreatitis, consistent with the patient's abdominal pain, nausea, anorexia.  No CT evidence for necrosis, cyst, pseudocyst.  She does have CT evidence for renal pathology previously  demonstrated.  Patient also found to have substantial electrolyte abnormalities requiring additional fluid resuscitation,  potassium supplementation.  Patient required admission for further monitoring, management.        Final Clinical Impression(s) / ED Diagnoses Final diagnoses:  Acute pancreatitis without infection or necrosis, unspecified pancreatitis type  Hypokalemia    Rx / DC Orders ED Discharge Orders     None         Carmin Muskrat, MD 03/17/22 2019

## 2022-03-17 NOTE — Assessment & Plan Note (Addendum)
-   We will continue Eliquis and Toprol-XL. -We will check EKG.

## 2022-03-17 NOTE — Assessment & Plan Note (Signed)
We will continue PPI therapy and Carafate. ?

## 2022-03-17 NOTE — ED Notes (Signed)
Patient transported to Ultrasound 

## 2022-03-17 NOTE — Assessment & Plan Note (Signed)
-   We will check fasting lipids to assess triglycerides. - We will continue statin therapy.

## 2022-03-17 NOTE — Assessment & Plan Note (Signed)
-   We will continue Imdur and Plavix and hold off aspirin for now. - We will resume as needed sublingual nitroglycerin.

## 2022-03-18 DIAGNOSIS — K859 Acute pancreatitis without necrosis or infection, unspecified: Secondary | ICD-10-CM | POA: Diagnosis not present

## 2022-03-18 DIAGNOSIS — K219 Gastro-esophageal reflux disease without esophagitis: Secondary | ICD-10-CM | POA: Diagnosis not present

## 2022-03-18 DIAGNOSIS — E785 Hyperlipidemia, unspecified: Secondary | ICD-10-CM | POA: Diagnosis not present

## 2022-03-18 LAB — GLUCOSE, CAPILLARY
Glucose-Capillary: 117 mg/dL — ABNORMAL HIGH (ref 70–99)
Glucose-Capillary: 103 mg/dL — ABNORMAL HIGH (ref 70–99)
Glucose-Capillary: 121 mg/dL — ABNORMAL HIGH (ref 70–99)
Glucose-Capillary: 219 mg/dL — ABNORMAL HIGH (ref 70–99)
Glucose-Capillary: 109 mg/dL — ABNORMAL HIGH (ref 70–99)

## 2022-03-18 LAB — LIPID PANEL
Cholesterol: 124 mg/dL (ref 0–200)
HDL: 41 mg/dL (ref >40)
LDL Cholesterol: 64 mg/dL (ref 0–99)
Total CHOL/HDL Ratio: 3 RATIO
Triglycerides: 94 mg/dL (ref <150)
VLDL: 19 mg/dL (ref 0–40)

## 2022-03-18 LAB — URINALYSIS, ROUTINE W REFLEX MICROSCOPIC
Bilirubin Urine: NEGATIVE
Glucose, UA: NEGATIVE mg/dL
Hgb urine dipstick: NEGATIVE
Ketones, ur: NEGATIVE mg/dL
Nitrite: NEGATIVE
Protein, ur: 30 mg/dL — AB
Specific Gravity, Urine: 1.016 (ref 1.005–1.030)
pH: 5 (ref 5.0–8.0)

## 2022-03-18 LAB — BASIC METABOLIC PANEL
Anion gap: 9 (ref 5–15)
BUN: 42 mg/dL — ABNORMAL HIGH (ref 8–23)
CO2: 33 mmol/L — ABNORMAL HIGH (ref 22–32)
Calcium: 8.9 mg/dL (ref 8.9–10.3)
Chloride: 99 mmol/L (ref 98–111)
Creatinine, Ser: 2.22 mg/dL — ABNORMAL HIGH (ref 0.44–1.00)
GFR, Estimated: 22 mL/min — ABNORMAL LOW (ref >60)
Glucose, Bld: 124 mg/dL — ABNORMAL HIGH (ref 70–99)
Potassium: 3.5 mmol/L (ref 3.5–5.1)
Sodium: 141 mmol/L (ref 135–145)

## 2022-03-18 LAB — OCCULT BLOOD X 1 CARD TO LAB, STOOL: Fecal Occult Bld: POSITIVE — AB

## 2022-03-18 LAB — CBC
HCT: 24.4 % — ABNORMAL LOW (ref 36.0–46.0)
Hemoglobin: 7 g/dL — ABNORMAL LOW (ref 12.0–15.0)
MCH: 28 pg (ref 26.0–34.0)
MCHC: 28.7 g/dL — ABNORMAL LOW (ref 30.0–36.0)
MCV: 97.6 fL (ref 80.0–100.0)
Platelets: 148 10*3/uL — ABNORMAL LOW (ref 150–400)
RBC: 2.5 MIL/uL — ABNORMAL LOW (ref 3.87–5.11)
RDW: 15.2 % (ref 11.5–15.5)
WBC: 5.7 10*3/uL (ref 4.0–10.5)
nRBC: 0 % (ref 0.0–0.2)

## 2022-03-18 LAB — LIPASE, BLOOD: Lipase: 529 U/L — ABNORMAL HIGH (ref 11–51)

## 2022-03-18 MED ORDER — FERROUS SULFATE 325 (65 FE) MG PO TABS
325.0000 mg | ORAL_TABLET | Freq: Every day | ORAL | Status: DC
  Administered 2022-03-19 – 2022-03-24 (×5): 325 mg via ORAL
  Filled 2022-03-18 (×5): qty 1

## 2022-03-18 MED ORDER — FUROSEMIDE 40 MG PO TABS: 40.0000 mg | ORAL_TABLET | Freq: Two times a day (BID) | ORAL | Status: DC

## 2022-03-18 MED ORDER — FUROSEMIDE 40 MG PO TABS
40.0000 mg | ORAL_TABLET | Freq: Two times a day (BID) | ORAL | Status: DC
  Administered 2022-03-18 – 2022-03-19 (×2): 40 mg via ORAL
  Filled 2022-03-18 (×2): qty 1

## 2022-03-18 MED ORDER — POLYETHYLENE GLYCOL 3350 17 G PO PACK
17.0000 g | PACK | Freq: Two times a day (BID) | ORAL | Status: DC
  Administered 2022-03-18 – 2022-03-24 (×9): 17 g via ORAL
  Filled 2022-03-18 (×11): qty 1

## 2022-03-18 MED ORDER — SENNOSIDES-DOCUSATE SODIUM 8.6-50 MG PO TABS
1.0000 | ORAL_TABLET | Freq: Two times a day (BID) | ORAL | Status: DC
  Administered 2022-03-18 – 2022-03-24 (×10): 1 via ORAL
  Filled 2022-03-18 (×12): qty 1

## 2022-03-18 MED ORDER — POTASSIUM CHLORIDE IN NACL 20-0.9 MEQ/L-% IV SOLN
INTRAVENOUS | Status: DC
  Filled 2022-03-18: qty 1000

## 2022-03-18 MED ORDER — MOMETASONE FURO-FORMOTEROL FUM 200-5 MCG/ACT IN AERO
2.0000 | INHALATION_SPRAY | Freq: Two times a day (BID) | RESPIRATORY_TRACT | Status: DC
  Administered 2022-03-18 – 2022-03-24 (×12): 2 via RESPIRATORY_TRACT
  Filled 2022-03-18: qty 8.8

## 2022-03-18 MED ORDER — FUROSEMIDE 40 MG PO TABS: 40.0000 mg | ORAL_TABLET | Freq: Every day | ORAL | Status: DC

## 2022-03-18 MED ORDER — FOLIC ACID 1 MG PO TABS
1.0000 mg | ORAL_TABLET | Freq: Every day | ORAL | Status: DC
  Administered 2022-03-18 – 2022-03-24 (×6): 1 mg via ORAL
  Filled 2022-03-18 (×7): qty 1

## 2022-03-18 MED ORDER — LINACLOTIDE 145 MCG PO CAPS: 290.0000 ug | ORAL_CAPSULE | Freq: Every day | ORAL | Status: DC

## 2022-03-18 MED ORDER — CYCLOSPORINE 0.05 % OP EMUL
1.0000 [drp] | Freq: Two times a day (BID) | OPHTHALMIC | Status: DC
  Administered 2022-03-18 – 2022-03-24 (×13): 1 [drp] via OPHTHALMIC
  Filled 2022-03-18 (×14): qty 30

## 2022-03-18 NOTE — Progress Notes (Signed)
PROGRESS NOTE  [name] [name]:[id number] DOB: 1945-04-05 DOA: 03/17/2022 PCP: Lujean Amel, MD  HPI/Recap of past 24 hours: [name] is a 77 y.o. female with medical history significant for asthma, atrial fibrillation, coronary artery disease, status post PCI and stent, GERD, gout, hypertension, dyslipidemia and OSA as well as CVA and type 2 diabetes mellitus, who presented to the ER with generalized abdominal pain which have been worsening over the last 3 weeks with associated anorexia without nausea or vomiting. In the ED, VSS, labs revealed hypokalemia of 2.9 and hypochloremia of 90. Lipase level came back 1508 and total protein 6.1.  CBC showed anemia with hemoglobin 7.9 hematocrit 26.7 close to baseline.  CT abdomen/pelvis with no acute findings, pancreas unremarkable, rectum moderately distended with stool.  Right upper quadrant with hepatic steatosis without focal liver lesions, noted right renal cyst, follow-up with repeat CAT scan in 6-week as recommended.  Patient admitted for further management.    Today, pt reported improved abdominal pain, denies any chest pain, worsening SOB, denies any N/V, fever/chills.    Assessment/Plan: Principal Problem:   Acute pancreatitis Active Problems:   Hypokalemia   Atrial fibrillation (HCC)   Dyslipidemia   GERD without esophagitis   Coronary artery disease   Generalized abdominal pain ??Acute pancreatitis Reports generalized abdominal pain, with no CT findings of pancreatitis Noted elevated lipase level (which could be elevated in PUD among other conditions), currently trending down Pain management Clear liquid diet Stop IVF  Hx of GERD without esophagitis Noted drop in hgb (baseline around 8-9), now 7 Anemia panel showed iron 38, TIBC 213, ferritin 738, sats 18 FOBT pending, no signs of bleeding Continue PPI therapy and Carafate Continue oral iron supplementation Type and screen ??Consult GI if FOBT  positive  CKD stage IV Anemia of chronic kidney disease Baseline creatinine around 2.5 Restart IVF given hx of CHF Daily BMP   Chronic hypoxic respiratory failure COPD On 3L of O2 chronically  Chronic diastolic and systolic HF Coronary artery disease Stopped IVF given hx of fluid overload with CHF Start PTA lasix Continue Imdur Hold plavix and aspirin for now due to drop in hgb PRN sublingual nitroglycerin   Dyslipidemia TG WNL Continue statin therapy  Complete heart block Status post pacemaker  Constipation CT a/p showed moderate constipation Started bowel regimen, refuses linzess  OSA CPAP  Obesity Lifestyle modifications advised  Wheelchair bound     Pressure Injury 03/17/22 Sacrum Mid Stage 2 -  Partial thickness loss of dermis presenting as a shallow open injury with a red, pink wound bed without slough. small open section, can see previous healed wound (Active)  03/17/22 2315  Location: Sacrum  Location Orientation: Mid  Staging: Stage 2 -  Partial thickness loss of dermis presenting as a shallow open injury with a red, pink wound bed without slough.  Wound Description (Comments): small open section, can see previous healed wound  Present on Admission:   Dressing Type Foam - Lift dressing to assess site every shift 03/18/22 0746         Estimated body mass index is 33.19 kg/m as calculated from the following:   Height as of this encounter: '5\' 3"'$  (1.6 m).   Weight as of this encounter: 85 kg.      Code Status: Full  Family Communication: None at bedside  Disposition Plan: Status is: Inpatient Remains inpatient appropriate because: Level of care      Consultants: None  Procedures: None  Antimicrobials:  None  DVT prophylaxis: SCDs   Objective: Vitals:   03/17/22 2017 03/17/22 2301 03/18/22 0444 03/18/22 0728  BP:  (!) 162/68 (!) 115/56 138/60  Pulse:  89 84 78  Resp:  '18 16 17  '$ Temp: 98.1 F (36.7 C) 98.3 F (36.8 C)  98.3 F (36.8 C) 98.4 F (36.9 C)  TempSrc: Oral Oral Oral Oral  SpO2:  100% 97% 99%  Weight:      Height:        Intake/Output Summary (Last 24 hours) at 03/18/2022 1316 Last data filed at 03/18/2022 7846 Gross per 24 hour  Intake 460.35 ml  Output --  Net 460.35 ml   Filed Weights   03/17/22 1612  Weight: 85 kg    Exam: General: NAD  Cardiovascular: S1, S2 present Respiratory: Diminished BS b/l Abdomen: Soft, mildly tender, obese, bowel sounds present Musculoskeletal: No bilateral pedal edema noted Skin: Normal Psychiatry: Normal mood     Data Reviewed: CBC: Recent Labs  Lab 03/13/22 1116 03/17/22 1545 03/18/22 0538  WBC  --  8.0 5.7  NEUTROABS  --  5.7  --   HGB 7.8* 7.9* 7.0*  HCT  --  26.7* 24.4*  MCV  --  97.4 97.6  PLT  --  180 962*   Basic Metabolic Panel: Recent Labs  Lab 03/13/22 1116 03/17/22 1545 03/18/22 0538  NA 136 138 141  K 2.9* 2.9* 3.5  CL 94* 90* 99  CO2 35* 32 33*  GLUCOSE 172* 169* 124*  BUN 40* 45* 42*  CREATININE 2.04* 2.38* 2.22*  CALCIUM 9.1  9.3 9.4 8.9  PHOS 2.9  --   --    GFR: Estimated Creatinine Clearance: 21.9 mL/min (A) (by C-G formula based on SCr of 2.22 mg/dL (H)). Liver Function Tests: Recent Labs  Lab 03/13/22 1116 03/17/22 1545  AST  --  25  ALT  --  18  ALKPHOS  --  173*  BILITOT  --  0.5  PROT  --  6.1*  ALBUMIN 2.6* 2.6*   Recent Labs  Lab 03/17/22 1545 03/18/22 0538  LIPASE 1,308* 529*   No results for input(s): "AMMONIA" in the last 168 hours. Coagulation Profile: No results for input(s): "INR", "PROTIME" in the last 168 hours. Cardiac Enzymes: No results for input(s): "CKTOTAL", "CKMB", "CKMBINDEX", "TROPONINI" in the last 168 hours. BNP (last 3 results) No results for input(s): "PROBNP" in the last 8760 hours. HbA1C: No results for input(s): "HGBA1C" in the last 72 hours. CBG: Recent Labs  Lab 03/17/22 2310 03/18/22 0442 03/18/22 0730 03/18/22 1131  GLUCAP 120* 117* 103* 121*    Lipid Profile: Recent Labs    03/18/22 0538  CHOL 124  HDL 41  LDLCALC 64  TRIG 94  CHOLHDL 3.0   Thyroid Function Tests: No results for input(s): "TSH", "T4TOTAL", "FREET4", "T3FREE", "THYROIDAB" in the last 72 hours. Anemia Panel: No results for input(s): "VITAMINB12", "FOLATE", "FERRITIN", "TIBC", "IRON", "RETICCTPCT" in the last 72 hours. Urine analysis:    Component Value Date/Time   COLORURINE YELLOW 02/06/2022 2256   APPEARANCEUR CLOUDY (A) 02/06/2022 2256   LABSPEC 1.013 02/06/2022 2256   PHURINE 5.0 02/06/2022 2256   GLUCOSEU NEGATIVE 02/06/2022 2256   HGBUR SMALL (A) 02/06/2022 2256   BILIRUBINUR NEGATIVE 02/06/2022 2256   KETONESUR NEGATIVE 02/06/2022 2256   PROTEINUR 30 (A) 02/06/2022 2256   UROBILINOGEN 0.2 12/06/2014 1531   NITRITE POSITIVE (A) 02/06/2022 2256   LEUKOCYTESUR LARGE (A) 02/06/2022 2256   Sepsis Labs: '@LABRCNTIP'$ (procalcitonin:4,lacticidven:4)  )  No results found for this or any previous visit (from the past 240 hour(s)).    Studies: US Abdomen Limited RUQ (LIVER/GB)  Result Date: 03/17/2022 CLINICAL DATA:  History of prior cholecystectomy. Acute pancreatitis. EXAM: ULTRASOUND ABDOMEN LIMITED RIGHT UPPER QUADRANT COMPARISON:  None Available. FINDINGS: Gallbladder: Over gallbladder is surgically absent. Common bile duct: Diameter: 9.3 mm Liver: No focal lesion identified. Diffusely increased echogenicity of the liver parenchyma is noted. Portal vein is patent on color Doppler imaging with normal direction of blood flow towards the liver. Other: Of incidental note is the presence of a 3.7 cm right renal cyst. IMPRESSION: 1. Findings consistent with history of prior cholecystectomy. 2. Hepatic steatosis without focal liver lesions. 3. Right renal cyst which spine to the cystic lesion seen within the right kidney on the prior abdomen pelvis CT (March 17, 2022). Correlation with 6 week follow-up contrast-enhanced abdomen and pelvis CT is recommended  for improved characterization and to determine stability. Electronically Signed   By: Virgina Norfolk M.D.   On: 03/17/2022 21:20   CT ABDOMEN PELVIS WO CONTRAST  Result Date: 03/17/2022 CLINICAL DATA:  Abdominal pain for 3 days. EXAM: CT ABDOMEN AND PELVIS WITHOUT CONTRAST TECHNIQUE: Multidetector CT imaging of the abdomen and pelvis was performed following the standard protocol without IV contrast. RADIATION DOSE REDUCTION: This exam was performed according to the departmental dose-optimization program which includes automated exposure control, adjustment of the mA and/or kV according to patient size and/or use of iterative reconstruction technique. COMPARISON:  CT, 09/09/2021 and 04/21/2021. FINDINGS: Lower chest: 4 mm nodule, right middle lobe, image 4, series 8. 5 mm nodule, right lower lobe, image 12/8. Tiny nodule, right lower lobe, image 19/8. These nodules are stable from 09/09/2021. Mild dependent lower lobe atelectasis. No acute findings. Hepatobiliary: Of several small low-attenuation liver lesions, largest at the dome of segment 7, 1 cm size, nonspecific, likely cysts. No other liver abnormality. Gallbladder surgically absent. No bile duct dilation. Pancreas: Unremarkable. No pancreatic ductal dilatation or surrounding inflammatory changes. Spleen: Normal in size without focal abnormality. Adrenals/Urinary Tract: No adrenal masses. Mild bilateral renal cortical thinning. Isoattenuating right renal masses, 1 protruding from the posterolateral upper pole, 1.4 cm, the other protruding medially from the lower pole, 2 cm. Exophytic small cyst from the posterolateral midpole the left kidney, 1.3 cm. Right renal masses have both increased in size from the prior CT. Left renal cyst is without significant change. No new masses, no stones and no hydronephrosis. Normal ureters. Bladder mostly decompressed, otherwise unremarkable. Stomach/Bowel: Stomach mostly decompressed, otherwise unremarkable. Small bowel  and colon are normal in caliber. No wall thickening. No inflammation. Scattered colonic diverticula most numerous on the left. Rectum moderately distended with stool. Vascular/Lymphatic: Dense aortic atherosclerotic calcifications extending to the branch vessels. No aneurysm. No enlarged lymph nodes. Reproductive: Uterus and bilateral adnexa are unremarkable. Other: No ascites. Musculoskeletal: No fracture or acute finding. Well-positioned left total hip arthroplasty. Partly imaged posterior fusion hardware from a thoracic spine fusion. Thoracic spine hardware is new since the prior study. Left hip arthroplasty replaces compression screw and intramedullary rod seen previously. IMPRESSION: 1. No acute findings within the abdomen or pelvis. 2. 2 isoattenuating right renal masses have increased in size from previous CT. Although these may still reflect complicated/hemorrhagic cysts, recommend further assessment with nonemergent renal ultrasound. 3. Small lung base nodules as detailed stable from the CT dated 09/09/2021, and from exam from 2019, benign with no follow-up recommended. 4. Aortic atherosclerosis. 5. Rectum moderately distended with  stool. Electronically Signed   By: Lajean Manes M.D.   On: 03/17/2022 18:16   UE VENOUS DUPLEX (7am - 7pm)  Result Date: 03/17/2022 UPPER VENOUS STUDY  Patient Name:  [name]  Date of Exam:   03/17/2022 Medical Rec #: [id number]          Accession #:    [id number] Date of Birth: 20-Apr-1945           Patient Gender: F Patient Age:   1 years Exam Location:  [facility] Procedure:      VAS [place] UPPER EXTREMITY VENOUS DUPLEX Referring Phys: Herbie Baltimore LOCKWOOD --------------------------------------------------------------------------------  Indications: Pain, Edema, and numbness Limitations: Body habitus,PM placement, and poor ultrasound/tissue interface. Comparison Study: No previous UEV exams Performing Technologist: Jody Hill RVT, RDMS  Examination Guidelines: A  complete evaluation includes B-mode imaging, spectral Doppler, color Doppler, and power Doppler as needed of all accessible portions of each vessel. Bilateral testing is considered an integral part of a complete examination. Limited examinations for reoccurring indications may be performed as noted.  Right Findings: +----------+------------+---------+-----------+----------+--------------------+ RIGHT     CompressiblePhasicitySpontaneousProperties      Summary        +----------+------------+---------+-----------+----------+--------------------+ IJV           Full       Yes       Yes                                   +----------+------------+---------+-----------+----------+--------------------+ Subclavian               Yes       Yes                   patent by                                                              color/doppler     +----------+------------+---------+-----------+----------+--------------------+ Axillary      Full       Yes       Yes                                   +----------+------------+---------+-----------+----------+--------------------+ Brachial      Full       Yes       Yes                                   +----------+------------+---------+-----------+----------+--------------------+ Radial        Full                                                       +----------+------------+---------+-----------+----------+--------------------+ Cephalic      Full                                    patent by color    +----------+------------+---------+-----------+----------+--------------------+  Basilic       Full       Yes       Yes                                   +----------+------------+---------+-----------+----------+--------------------+ Limited visualization of ulnar veins - patent by color only  Left Findings: +----------+------------+---------+-----------+----------+--------------------+ LEFT       CompressiblePhasicitySpontaneousProperties      Summary        +----------+------------+---------+-----------+----------+--------------------+ Subclavian               Yes       Yes                   patent by                                                              color/doppler     +----------+------------+---------+-----------+----------+--------------------+  Summary:  Right: No evidence of deep vein thrombosis in the upper extremity. No evidence of superficial vein thrombosis in the upper extremity.  Left: No evidence of thrombosis in the subclavian.  *See table(s) above for measurements and observations.  Diagnosing physician: Servando Snare MD Electronically signed by Servando Snare MD on 03/17/2022 at 5:37:48 PM.    Final     Scheduled Meds:  enoxaparin (LOVENOX) injection  30 mg Subcutaneous Q24H   insulin aspart  0-9 Units Subcutaneous Q6H   polyethylene glycol  17 g Oral BID   senna-docusate  1 tablet Oral BID    Continuous Infusions:  0.9 % NaCl with KCl 20 mEq / L 75 mL/hr at 03/18/22 0929     LOS: 1 day     Alma Friendly, MD [facility]  If 7PM-7AM, please contact night-coverage [email or web address] 03/18/2022, 1:16 PM

## 2022-03-19 DIAGNOSIS — K859 Acute pancreatitis without necrosis or infection, unspecified: Secondary | ICD-10-CM | POA: Diagnosis not present

## 2022-03-19 DIAGNOSIS — D649 Anemia, unspecified: Secondary | ICD-10-CM

## 2022-03-19 DIAGNOSIS — R195 Other fecal abnormalities: Secondary | ICD-10-CM

## 2022-03-19 DIAGNOSIS — I2583 Coronary atherosclerosis due to lipid rich plaque: Secondary | ICD-10-CM

## 2022-03-19 DIAGNOSIS — I2510 Atherosclerotic heart disease of native coronary artery without angina pectoris: Secondary | ICD-10-CM

## 2022-03-19 DIAGNOSIS — E876 Hypokalemia: Secondary | ICD-10-CM | POA: Diagnosis not present

## 2022-03-19 DIAGNOSIS — G8929 Other chronic pain: Secondary | ICD-10-CM

## 2022-03-19 LAB — CBC WITH DIFFERENTIAL/PLATELET
Abs Immature Granulocytes: 0.02 10*3/uL (ref 0.00–0.07)
Basophils Absolute: 0 10*3/uL (ref 0.0–0.1)
Basophils Relative: 1 %
Eosinophils Absolute: 0.4 10*3/uL (ref 0.0–0.5)
Eosinophils Relative: 8 %
HCT: 24.3 % — ABNORMAL LOW (ref 36.0–46.0)
Hemoglobin: 7.4 g/dL — ABNORMAL LOW (ref 12.0–15.0)
Immature Granulocytes: 0 %
Lymphocytes Relative: 24 %
Lymphs Abs: 1.2 10*3/uL (ref 0.7–4.0)
MCH: 29.2 pg (ref 26.0–34.0)
MCHC: 30.5 g/dL (ref 30.0–36.0)
MCV: 96 fL (ref 80.0–100.0)
Monocytes Absolute: 0.4 10*3/uL (ref 0.1–1.0)
Monocytes Relative: 9 %
Neutro Abs: 2.8 10*3/uL (ref 1.7–7.7)
Neutrophils Relative %: 58 %
Platelets: 134 10*3/uL — ABNORMAL LOW (ref 150–400)
RBC: 2.53 MIL/uL — ABNORMAL LOW (ref 3.87–5.11)
RDW: 15.5 % (ref 11.5–15.5)
WBC: 4.9 10*3/uL (ref 4.0–10.5)
nRBC: 0 % (ref 0.0–0.2)

## 2022-03-19 LAB — CBC
HCT: 30.5 % — ABNORMAL LOW (ref 36.0–46.0)
Hemoglobin: 9.7 g/dL — ABNORMAL LOW (ref 12.0–15.0)
MCH: 29.1 pg (ref 26.0–34.0)
MCHC: 31.8 g/dL (ref 30.0–36.0)
MCV: 91.6 fL (ref 80.0–100.0)
Platelets: 138 10*3/uL — ABNORMAL LOW (ref 150–400)
RBC: 3.33 MIL/uL — ABNORMAL LOW (ref 3.87–5.11)
RDW: 17.2 % — ABNORMAL HIGH (ref 11.5–15.5)
WBC: 7.4 10*3/uL (ref 4.0–10.5)
nRBC: 0 % (ref 0.0–0.2)

## 2022-03-19 LAB — BASIC METABOLIC PANEL
Anion gap: 9 (ref 5–15)
BUN: 38 mg/dL — ABNORMAL HIGH (ref 8–23)
CO2: 29 mmol/L (ref 22–32)
Calcium: 8.9 mg/dL (ref 8.9–10.3)
Chloride: 100 mmol/L (ref 98–111)
Creatinine, Ser: 1.93 mg/dL — ABNORMAL HIGH (ref 0.44–1.00)
GFR, Estimated: 26 mL/min — ABNORMAL LOW (ref >60)
Glucose, Bld: 101 mg/dL — ABNORMAL HIGH (ref 70–99)
Potassium: 3.4 mmol/L — ABNORMAL LOW (ref 3.5–5.1)
Sodium: 138 mmol/L (ref 135–145)

## 2022-03-19 LAB — GLUCOSE, CAPILLARY
Glucose-Capillary: 122 mg/dL — ABNORMAL HIGH (ref 70–99)
Glucose-Capillary: 133 mg/dL — ABNORMAL HIGH (ref 70–99)
Glucose-Capillary: 139 mg/dL — ABNORMAL HIGH (ref 70–99)
Glucose-Capillary: 91 mg/dL (ref 70–99)

## 2022-03-19 LAB — LIPASE, BLOOD: Lipase: 382 U/L — ABNORMAL HIGH (ref 11–51)

## 2022-03-19 LAB — PREPARE RBC (CROSSMATCH)

## 2022-03-19 MED ORDER — FUROSEMIDE 40 MG PO TABS: 40.0000 mg | ORAL_TABLET | Freq: Two times a day (BID) | ORAL | Status: DC

## 2022-03-19 MED ORDER — SODIUM CHLORIDE 0.9% IV SOLUTION: Freq: Once | INTRAVENOUS | Status: AC

## 2022-03-19 MED ORDER — POTASSIUM CHLORIDE CRYS ER 20 MEQ PO TBCR
40.0000 meq | EXTENDED_RELEASE_TABLET | Freq: Four times a day (QID) | ORAL | Status: AC
  Administered 2022-03-19 (×2): 40 meq via ORAL
  Filled 2022-03-19 (×2): qty 2

## 2022-03-19 MED ORDER — OXYCODONE-ACETAMINOPHEN 5-325 MG PO TABS
1.0000 | ORAL_TABLET | Freq: Four times a day (QID) | ORAL | Status: DC | PRN
  Administered 2022-03-19 – 2022-03-24 (×6): 1 via ORAL
  Filled 2022-03-19 (×6): qty 1

## 2022-03-19 MED ORDER — PANTOPRAZOLE SODIUM 40 MG IV SOLR
40.0000 mg | Freq: Two times a day (BID) | INTRAVENOUS | Status: DC
  Administered 2022-03-19 (×2): 40 mg via INTRAVENOUS
  Filled 2022-03-19 (×3): qty 10

## 2022-03-19 NOTE — Evaluation (Signed)
Physical Therapy Evaluation Patient Details Name: [name] MRN: [id number] DOB: [birth year] Today's Date: 03/19/2022  History of Present Illness  Pt is a 77 y/o F admitted on 03/17/22 after presenting with c/o generalized abdominal pain that has worsened over the last 3 weeks. Pt admitted for further work-up. Of note, pt underwent removal of left cephalomedullary femoral nail, conversion of left total hip arthroplasty, ORIF of greater trochanter fracture with trochanteric hook plate on 1/0/2585. PMH: asthma, a-fib, CAD, s/p PCI & stent, GERD, gout, HTN, dyslipidemia, OSA, CVA, DM2  Clinical Impression  Pt seen for PT evaluation with pt agreeable with min encouragement. Pt was living at home prior to admission in January but has since been in rehab where she is working on gait.  On this date, pt requires min assist to complete bed mobility in addition to use of hospital bed features. Pt transfers STS with CGA, step pivot with min assist with cuing to maintain PWB LLE. Pt noted to have incontinent BM & requires total assist for peri hygiene. Pt fatigued afterwards & politely declines gait attempts. Recommend STR upon d/c.     Recommendations for follow up therapy are one component of a multi-disciplinary discharge planning process, led by the attending physician.  Recommendations may be updated based on patient status, additional functional criteria and insurance authorization.  Follow Up Recommendations Skilled nursing-short term rehab (<3 hours/day) Can patient physically be transported by private vehicle: Yes    Assistance Recommended at Discharge Frequent or constant Supervision/Assistance  Patient can return home with the following  A little help with walking and/or transfers;A little help with bathing/dressing/bathroom;Assistance with cooking/housework;Assist for transportation;Help with stairs or ramp for entrance    Equipment Recommendations None recommended by PT  Recommendations for  Other Services       Functional Status Assessment Patient has had a recent decline in their functional status and demonstrates the ability to make significant improvements in function in a reasonable and predictable amount of time.     Precautions / Restrictions Precautions Precautions: Fall Precaution Comments: no active hip abdction LLE Restrictions Weight Bearing Restrictions: Yes LLE Weight Bearing: Partial weight bearing LLE Partial Weight Bearing Percentage or Pounds: 50%      Mobility  Bed Mobility Overal bed mobility: Needs Assistance Bed Mobility: Supine to Sit     Supine to sit: Min assist, HOB elevated     General bed mobility comments: extra time to come to sitting EOB, HOB elevated    Transfers Overall transfer level: Needs assistance Equipment used: Rolling walker (2 wheels) Transfers: Sit to/from Stand, Bed to chair/wheelchair/BSC Sit to Stand: Min guard   Step pivot transfers: Min assist       General transfer comment: Cuing to maintain PWB LLE but question if pt really is able.    Ambulation/Gait Ambulation/Gait assistance:  (Pt politely declined.)                Stairs            Wheelchair Mobility    Modified Rankin (Stroke Patients Only)       Balance Overall balance assessment: Needs assistance Sitting-balance support: Feet supported Sitting balance-Leahy Scale: Fair Sitting balance - Comments: supervision static sitting   Standing balance support: Bilateral upper extremity supported, During functional activity, Reliant on assistive device for balance Standing balance-Leahy Scale: Fair  Pertinent Vitals/Pain Pain Assessment Pain Assessment: Faces Faces Pain Scale: Hurts little more Pain Location: buttocks Pain Descriptors / Indicators: Discomfort, Grimacing Pain Intervention(s): Monitored during session, Repositioned    Home Living                      Additional Comments: Pt was living in her home prior to admission in January, but has since been at rehab. Pt anticipating d/c back to rehab.    Prior Function Prior Level of Function : Needs assist;History of Falls (last six months)             Mobility Comments: Pt reports she was working on ambulating with RW at rehab.       Hand Dominance        Extremity/Trunk Assessment   Upper Extremity Assessment Upper Extremity Assessment: Generalized weakness    Lower Extremity Assessment Lower Extremity Assessment: Generalized weakness       Communication   Communication: No difficulties  Cognition Arousal/Alertness: Awake/alert Behavior During Therapy: WFL for tasks assessed/performed Overall Cognitive Status: Within Functional Limits for tasks assessed                                 General Comments: Requires min encouragement for OOB mobility, engages with PT during session.        General Comments General comments (skin integrity, edema, etc.): Pt on 3L/min via nasal cannula throughout session (reports this is baseline). Pt with incontinent BM & requires total assist for peri hygiene. Pt left with BLE SCDs donned at end of session.    Exercises     Assessment/Plan    PT Assessment Patient needs continued PT services  PT Problem List Decreased strength;Decreased activity tolerance;Decreased balance;Decreased mobility;Decreased safety awareness;Decreased knowledge of use of DME;Decreased knowledge of precautions;Decreased skin integrity       PT Treatment Interventions DME instruction;Therapeutic exercise;Gait training;Balance training;Neuromuscular re-education;Functional mobility training;Cognitive remediation;Therapeutic activities;Patient/family education;Modalities    PT Goals (Current goals can be found in the Care Plan section)  Acute Rehab PT Goals Patient Stated Goal: feel better PT Goal Formulation: With patient Time For Goal  Achievement: 04/02/22 Potential to Achieve Goals: Good    Frequency Min 2X/week     Co-evaluation               AM-PAC PT "6 Clicks" Mobility  Outcome Measure Help needed turning from your back to your side while in a flat bed without using bedrails?: A Little Help needed moving from lying on your back to sitting on the side of a flat bed without using bedrails?: A Lot Help needed moving to and from a bed to a chair (including a wheelchair)?: A Little Help needed standing up from a chair using your arms (e.g., wheelchair or bedside chair)?: A Little Help needed to walk in hospital room?: Total Help needed climbing 3-5 steps with a railing? : Total 6 Click Score: 13    End of Session   Activity Tolerance: Patient tolerated treatment well Patient left: in chair;with chair alarm set;with call bell/phone within reach Nurse Communication: Mobility status;Weight bearing status PT Visit Diagnosis: Difficulty in walking, not elsewhere classified (R26.2);Muscle weakness (generalized) (M62.81);Other abnormalities of gait and mobility (R26.89)    Time: 5329-9242 PT Time Calculation (min) (ACUTE ONLY): 28 min   Charges:   PT Evaluation $PT Eval Moderate Complexity: 1 Mod PT Treatments $Therapeutic Activity: 8-22 mins  Lavone Nian, PT, DPT 03/19/22, 12:59 PM   Waunita Schooner 03/19/2022, 12:57 PM

## 2022-03-19 NOTE — Progress Notes (Signed)
Blood transfusion started at 1032AM per order. No reaction noted.   Blood transfusion stopped at 1340PM. No reaction noted

## 2022-03-19 NOTE — Evaluation (Signed)
Occupational Therapy Evaluation Patient Details Name: [name] MRN: [id number] DOB: Aug 25, 1945 Today's Date: 03/19/2022   History of Present Illness Pt is a 77 y/o F admitted on 03/17/22 after presenting with c/o generalized abdominal pain that has worsened over the last 3 weeks. Pt admitted for further work-up. Of note, pt underwent removal of left cephalomedullary femoral nail, conversion of left total hip arthroplasty, ORIF of greater trochanter fracture with trochanteric hook plate on 05/14/345. PMH: asthma, a-fib, CAD, s/p PCI & stent, GERD, gout, HTN, dyslipidemia, OSA, CVA, DM2   Clinical Impression   Prior to this admission, patient at [facility] rehab. Patient was walking with a RW short distances, and requiring mod A for ADLs. Currently, patient presenting with stomach pain, need for mod A for ADLs, and min gaurd for basic stand pivot transfers. OT recommending return to SNF once medically stable to continue advancement in functional mobility and ADL independence.      Recommendations for follow up therapy are one component of a multi-disciplinary discharge planning process, led by the attending physician.  Recommendations may be updated based on patient status, additional functional criteria and insurance authorization.   Follow Up Recommendations  Skilled nursing-short term rehab (<3 hours/day) (Return to [facility])     Assistance Recommended at Discharge    Patient can return home with the following A little help with walking and/or transfers;A lot of help with bathing/dressing/bathroom;Assistance with cooking/housework;Assist for transportation;Help with stairs or ramp for entrance    Functional Status Assessment  Patient has had a recent decline in their functional status and demonstrates the ability to make significant improvements in function in a reasonable and predictable amount of time.  Equipment Recommendations  Other (comment) (Defer to next venue)     Recommendations for Other Services       Precautions / Restrictions Precautions Precautions: Fall Precaution Comments: no active hip abduction LLE Restrictions Weight Bearing Restrictions: Yes LLE Weight Bearing: Partial weight bearing LLE Partial Weight Bearing Percentage or Pounds: 50%      Mobility Bed Mobility Overal bed mobility: Needs Assistance Bed Mobility: Sit to Supine       Sit to supine: Min assist   General bed mobility comments: Min A to bring BLEs into bed    Transfers Overall transfer level: Needs assistance Equipment used: Rolling walker (2 wheels) Transfers: Sit to/from Stand, Bed to chair/wheelchair/BSC Sit to Stand: Min guard     Step pivot transfers: Min guard            Balance Overall balance assessment: Needs assistance Sitting-balance support: Feet supported Sitting balance-Leahy Scale: Fair     Standing balance support: Bilateral upper extremity supported, During functional activity, Reliant on assistive device for balance Standing balance-Leahy Scale: Fair                             ADL either performed or assessed with clinical judgement   ADL Overall ADL's : Needs assistance/impaired Eating/Feeding: Set up;Sitting   Grooming: Set up;Sitting   Upper Body Bathing: Min guard;Sitting   Lower Body Bathing: Moderate assistance;Sitting/lateral leans;Sit to/from stand   Upper Body Dressing : Min guard;Sitting   Lower Body Dressing: Moderate assistance;Sit to/from stand;Sitting/lateral leans   Toilet Transfer: Min guard;Stand-pivot;Cueing for sequencing;Cueing for safety   Toileting- Clothing Manipulation and Hygiene: Total assistance;Sitting/lateral lean;Sit to/from stand Toileting - Clothing Manipulation Details (indicate cue type and reason): unable to complete peri-care in standing     Functional  mobility during ADLs: Moderate assistance;Cueing for safety;Cueing for sequencing;Rolling walker (2  wheels) General ADL Comments: Patient presenting with stomach pain, need for mod A for ADLs, and min gaurd for basic stand pivot transfers.     Vision Baseline Vision/History: 1 Wears glasses Ability to See in Adequate Light: 0 Adequate Patient Visual Report: No change from baseline       Perception     Praxis      Pertinent Vitals/Pain Pain Assessment Pain Assessment: Faces Faces Pain Scale: Hurts little more Pain Location: buttocks Pain Descriptors / Indicators: Discomfort, Grimacing Pain Intervention(s): Limited activity within patient's tolerance, Monitored during session, Repositioned     Hand Dominance Right   Extremity/Trunk Assessment Upper Extremity Assessment Upper Extremity Assessment: Generalized weakness   Lower Extremity Assessment Lower Extremity Assessment: Defer to PT evaluation   Cervical / Trunk Assessment Cervical / Trunk Assessment: Other exceptions (increased body habitus) Cervical / Trunk Exceptions: increased body habitus   Communication Communication Communication: No difficulties   Cognition Arousal/Alertness: Awake/alert Behavior During Therapy: WFL for tasks assessed/performed Overall Cognitive Status: Within Functional Limits for tasks assessed                                 General Comments: Benefitting from joking back and forth with OT to promote increased participation     General Comments  Pt on 3L/min via nasal cannula throughout session (reports this is baseline). Pt with incontinent BM & requires total assist for peri hygiene.    Exercises     Shoulder Instructions      Home Living                                   Additional Comments: Pt was living in her home prior to admission in January, but has since been at rehab. Pt anticipating d/c back to rehab.      Prior Functioning/Environment Prior Level of Function : Needs assist;History of Falls (last six months)             Mobility  Comments: Pt reports she was working on ambulating with RW at rehab. ADLs Comments: Patient has assistance with bathing and dressing, has not been in the shower since rehab        OT Problem List: Decreased strength;Decreased range of motion;Decreased activity tolerance;Impaired balance (sitting and/or standing);Decreased coordination;Decreased cognition;Decreased safety awareness;Decreased knowledge of use of DME or AE;Decreased knowledge of precautions;Pain      OT Treatment/Interventions: Self-care/ADL training;Therapeutic exercise;Energy conservation;DME and/or AE instruction;Manual therapy;Patient/family education;Balance training    OT Goals(Current goals can be found in the care plan section) Acute Rehab OT Goals Patient Stated Goal: to get this stomach stuff sorted out OT Goal Formulation: With patient Time For Goal Achievement: 04/02/22 Potential to Achieve Goals: Good  OT Frequency: Min 2X/week    Co-evaluation              AM-PAC OT "6 Clicks" Daily Activity     Outcome Measure Help from another person eating meals?: A Little Help from another person taking care of personal grooming?: A Little Help from another person toileting, which includes using toliet, bedpan, or urinal?: A Lot Help from another person bathing (including washing, rinsing, drying)?: A Lot Help from another person to put on and taking off regular upper body clothing?: A Little Help from another person to put  on and taking off regular lower body clothing?: A Lot 6 Click Score: 15   End of Session Equipment Utilized During Treatment: Rolling walker (2 wheels) Nurse Communication: Mobility status  Activity Tolerance: Patient tolerated treatment well Patient left: in bed;with call bell/phone within reach;with bed alarm set  OT Visit Diagnosis: Unsteadiness on feet (R26.81);Other abnormalities of gait and mobility (R26.89);Muscle weakness (generalized) (M62.81);Pain Pain - part of body:  (Stomach)                 Time: 6948-5462 OT Time Calculation (min): 27 min Charges:  OT General Charges $OT Visit: 1 Visit OT Evaluation $OT Eval Moderate Complexity: 1 Mod OT Treatments $Self Care/Home Management : 8-22 mins  Corinne Ports E. Mirha Brucato, OTR/L Acute Rehabilitation Services [place] 03/19/2022, 3:49 PM

## 2022-03-19 NOTE — Progress Notes (Signed)
PROGRESS NOTE  JENNEL MARA IRS:[id number] DOB: 04-27-1945 DOA: 03/17/2022 PCP: Lujean Amel, MD  HPI/brief narrative  [name] is a 77 y.o. female with medical history significant for asthma, atrial fibrillation, coronary artery disease, status post PCI and stent, GERD, gout, hypertension, dyslipidemia and OSA as well as CVA and type 2 diabetes mellitus, who presented to the ER with generalized abdominal pain which have been worsening over the last 3 weeks with associated anorexia without nausea or vomiting. In the ED, VSS, labs revealed hypokalemia of 2.9 and hypochloremia of 90. Lipase level came back 1508 and total protein 6.1.  CBC showed anemia with hemoglobin 7.9 hematocrit 26.7 close to baseline.  CT abdomen/pelvis with no acute findings, pancreas unremarkable, rectum moderately distended with stool.  Right upper quadrant with hepatic steatosis without focal liver lesions, noted right renal cyst, follow-up with repeat CAT scan in 6-week as recommended.  Patient admitted for further management.   Subjective  Reports abdominal pain, minimal nausea, no vomiting, tolerating clear liquid diet   Assessment/Plan: Principal Problem:   Acute pancreatitis Active Problems:   Hypokalemia   Atrial fibrillation (HCC)   Dyslipidemia   GERD without esophagitis   Coronary artery disease   Generalized abdominal pain Acute pancreatitis Reports generalized abdominal pain, with no CT findings of pancreatitis -Lipase level significantly elevated on admission, trending down, no evidence of bile duct dilation, no history of alcohol use. -She started clear liquid diet, Pain management Clear liquid diet Stop IVF, will hold her Lasix for today, and resume tomorrow  Hx of GERD without esophagitis Hemoccult positive stool Noted drop in hgb (baseline around 8-9), now 7 Anemia panel showed iron 38, TIBC 213, ferritin 738, sats 18 FOBT positive Continue PPI therapy and  Carafate Continue oral iron supplementation Hemoccult is positive, will give 1 unit PRBC today Gi consulted,  CKD stage IV Anemia of chronic kidney disease Baseline creatinine around 2.5 Daily BMP   Chronic hypoxic respiratory failure COPD On 3L of O2 chronically  Chronic diastolic and systolic HF Coronary artery disease Stopped IVF given hx of fluid overload with CHF Continue Imdur Hold plavix and aspirin for now due to Hemoccult positive stool PRN sublingual nitroglycerin -Resume Lasix tomorrow   Dyslipidemia TG WNL Continue statin therapy  Complete heart block Status post pacemaker  Constipation CT a/p showed moderate constipation Started bowel regimen, refuses linzess  OSA CPAP  Obesity Lifestyle modifications advised  Wheelchair bound     Pressure Injury 03/17/22 Sacrum Mid Stage 2 -  Partial thickness loss of dermis presenting as a shallow open injury with a red, pink wound bed without slough. small open section, can see previous healed wound (Active)  03/17/22 2315  Location: Sacrum  Location Orientation: Mid  Staging: Stage 2 -  Partial thickness loss of dermis presenting as a shallow open injury with a red, pink wound bed without slough.  Wound Description (Comments): small open section, can see previous healed wound  Present on Admission:   Dressing Type Foam - Lift dressing to assess site every shift 03/19/22 0829         Estimated body mass index is 33.19 kg/m as calculated from the following:   Height as of this encounter: '5\' 3"'$  (1.6 m).   Weight as of this encounter: 85 kg.      Code Status: Full  Family Communication: None at bedside  Disposition Plan: Status is: Inpatient Remains inpatient appropriate because: Level of care      Consultants:  GI  Procedures: None  Antimicrobials: None  DVT prophylaxis: SCDs lovenox   Objective: Vitals:   03/18/22 1935 03/19/22 0604 03/19/22 0801 03/19/22 0830  BP:  (!) 147/80  (!) 153/62   Pulse: 81 80 79 79  Resp: '18 16 18 18  '$ Temp:  97.9 F (36.6 C) 97.8 F (36.6 C)   TempSrc:  Oral Oral   SpO2: 95% 100% 99% 99%  Weight:      Height:        Intake/Output Summary (Last 24 hours) at 03/19/2022 1013 Last data filed at 03/19/2022 8832 Gross per 24 hour  Intake 725.88 ml  Output 400 ml  Net 325.88 ml   Filed Weights   03/17/22 1612  Weight: 85 kg    Exam:  Awake Alert, Oriented X 3, No new F.N deficits, Normal affect Symmetrical Chest wall movement, Good air movement bilaterally, CTAB RRR,No Gallops,Rubs or new Murmurs, No Parasternal Heave +ve B.Sounds, Abd Soft, mild epigastric tenderness No Cyanosis, Clubbing or edema, No new Rash or bruise       Data Reviewed: CBC: Recent Labs  Lab 03/13/22 1116 03/17/22 1545 03/18/22 0538 03/19/22 0448  WBC  --  8.0 5.7 4.9  NEUTROABS  --  5.7  --  2.8  HGB 7.8* 7.9* 7.0* 7.4*  HCT  --  26.7* 24.4* 24.3*  MCV  --  97.4 97.6 96.0  PLT  --  180 148* 549*   Basic Metabolic Panel: Recent Labs  Lab 03/13/22 1116 03/17/22 1545 03/18/22 0538 03/19/22 0448  NA 136 138 141 138  K 2.9* 2.9* 3.5 3.4*  CL 94* 90* 99 100  CO2 35* 32 33* 29  GLUCOSE 172* 169* 124* 101*  BUN 40* 45* 42* 38*  CREATININE 2.04* 2.38* 2.22* 1.93*  CALCIUM 9.1  9.3 9.4 8.9 8.9  PHOS 2.9  --   --   --    GFR: Estimated Creatinine Clearance: 25.2 mL/min (A) (by C-G formula based on SCr of 1.93 mg/dL (H)). Liver Function Tests: Recent Labs  Lab 03/13/22 1116 03/17/22 1545  AST  --  25  ALT  --  18  ALKPHOS  --  173*  BILITOT  --  0.5  PROT  --  6.1*  ALBUMIN 2.6* 2.6*   Recent Labs  Lab 03/17/22 1545 03/18/22 0538 03/19/22 0448  LIPASE 1,308* 529* 382*   No results for input(s): "AMMONIA" in the last 168 hours. Coagulation Profile: No results for input(s): "INR", "PROTIME" in the last 168 hours. Cardiac Enzymes: No results for input(s): "CKTOTAL", "CKMB", "CKMBINDEX", "TROPONINI" in the last 168  hours. BNP (last 3 results) No results for input(s): "PROBNP" in the last 8760 hours. HbA1C: No results for input(s): "HGBA1C" in the last 72 hours. CBG: Recent Labs  Lab 03/18/22 0730 03/18/22 1131 03/18/22 1540 03/18/22 1905 03/19/22 0607  GLUCAP 103* 121* 219* 109* 91   Lipid Profile: Recent Labs    03/18/22 0538  CHOL 124  HDL 41  LDLCALC 64  TRIG 94  CHOLHDL 3.0   Thyroid Function Tests: No results for input(s): "TSH", "T4TOTAL", "FREET4", "T3FREE", "THYROIDAB" in the last 72 hours. Anemia Panel: No results for input(s): "VITAMINB12", "FOLATE", "FERRITIN", "TIBC", "IRON", "RETICCTPCT" in the last 72 hours. Urine analysis:    Component Value Date/Time   COLORURINE YELLOW 03/17/2022 1530   APPEARANCEUR CLEAR 03/17/2022 1530   LABSPEC 1.016 03/17/2022 1530   PHURINE 5.0 03/17/2022 1530   GLUCOSEU NEGATIVE 03/17/2022 1530   HGBUR NEGATIVE 03/17/2022  [place] 03/17/2022 [place] 03/17/2022 1530   PROTEINUR 30 (A) 03/17/2022 1530   UROBILINOGEN 0.2 12/06/2014 1531   NITRITE NEGATIVE 03/17/2022 1530   LEUKOCYTESUR TRACE (A) 03/17/2022 1530   Sepsis Labs: '@LABRCNTIP'$ (procalcitonin:4,lacticidven:4)  )No results found for this or any previous visit (from the past 240 hour(s)).    Studies: No results found.  Scheduled Meds:  sodium chloride   Intravenous Once   cycloSPORINE  1 drop Both Eyes BID   enoxaparin (LOVENOX) injection  30 mg Subcutaneous Q24H   ferrous sulfate  325 mg Oral Q breakfast   folic acid  1 mg Oral Daily   furosemide  40 mg Oral BID   insulin aspart  0-9 Units Subcutaneous Q6H   mometasone-formoterol  2 puff Inhalation BID   pantoprazole (PROTONIX) IV  40 mg Intravenous Q12H   polyethylene glycol  17 g Oral BID   senna-docusate  1 tablet Oral BID    Continuous Infusions:     LOS: 2 days     Phillips Climes, MD [facility]  If 7PM-7AM, please contact  night-coverage [email or web address] 03/19/2022, 10:13 AM

## 2022-03-19 NOTE — Consult Note (Addendum)
Attending physician's note   I have taken a history, reviewed the chart, and examined the patient. I performed a substantive portion of this encounter, including complete performance of at least one of the key components, in conjunction with the APP. I agree with the APP's note, impression, and recommendations with my edits.   77 year old female with multiple comorbidities as outlined below, admitted with acute on chronic abdominal pain, nausea, decreased appetite.  Initial concern for acute pancreatitis with lipase 1500, but CT with normal-appearing pancreas.  Lipase has since down trended to 382 today.  GI service consulted for acute on chronic anemia with Hgb 7.0 (baseline~8) and heme positive stool.  Denies any overt bleeding.  Transfused 1 unit PRBCs today.  History of GERD with LA Grade B erosive esophagitis on EGD in 2019.  No recent breakthrough reflux symptoms.  1) Acute on chronic anemia 2) Heme positive stool 3) Epigastric pain - EGD tomorrow to evaluate for mucosal/luminal pathology and potentially therapeutic intent - High-dose IV PPI for possible PUD - Posttransfusion CBC check - Continue serial CBC checks with additional blood products as needed per protocol - Liquid diet today with n.p.o. at midnight  4) CAD with history of CABG 5) CHF 6) History of CVA - Holding ASA/Plavix.  Last dose was 2/4 - Hold Lovenox  7) Constipation History of chronic constipation.  CT with moderately distended rectum with stool - Bowel regimen as outlined  8) COPD on home O2 - Elevated periprocedural risks due to underlying cardiopulmonary disease.  Plan for MAC assistance for EGD above   The indications, risks, and benefits of EGD were explained to the patient in detail. Risks include but are not limited to bleeding, perforation, adverse reaction to medications, and cardiopulmonary compromise. Sequelae include but are not limited to the possibility of surgery, hospitalization, and  mortality. The patient verbalized understanding and wished to proceed.    [address], DO, FACG [phone] office          Consultation  Referring Provider:   [facility] Primary Care Physician:  Lujean Amel, MD Primary Gastroenterologist:  Dr. Silverio Decamp       Reason for Consultation:   Anemia with FOBT +      Impression    Acute on chronic anemia, FOBT positive with epigastric abdominal pain EGD and colonoscopy 2019 grade B esophagitis, colonoscopy polyps, diverticulosis and hemorrhoids. Elevated lipase, CT without pancreatitis, likely this was from another process or falsely elevated, has been decreasing.  Chronic abdominal pain  CT abdomen pelvis unremarkable other than stool burden Vascular mesenteric ultrasound  with less than 70% stenosis of SMA in March Likely musculoskeletal, opioid use, chronic constipation.   Multiple comorbidities including coronary disease status post bypass chronic diastolic heart failure status post AICD Patient is on Plavix  last dose was Sunday or Monday, has been on Lovenox.  Will hold prior to endoscopic valuation.   Chronic hypoxic respiratory failure on oxygen therapy At her baseline at this time  Principal Problem:   Acute pancreatitis Active Problems:   Atrial fibrillation (HCC)   Hypokalemia   Dyslipidemia   GERD without esophagitis   Coronary artery disease    LOS: 2 days     Plan   Patient with likely multifactorial anemia with history of CKD, anemia of chronic disease, multiple surgeries most recently 1/03 however with FOBT positive and chronic epigastric pain discussed further with patient we will proceed with endoscopic evaluation tomorrow. We did discuss how patient is high  risk, with multiple comorbidities and she wishes to proceed. -Protonix 40 mg IV BID. -Clear liquid diet, NPO at midnight. --Continue to monitor H&H with transfusion as needed to maintain hemoglobin greater than 7. -Plan for EGD tomorrow per  patient preference. I thoroughly discussed the procedure to include nature, alternatives, benefits, and risks including but not limited to bleeding, perforation, infection, anesthesia/cardiac and pulmonary complications. Patient provides understanding and gave verbal consent to proceed. -Hold Lovenox prior to endoscopic evaluation -Suggest lidocaine patches for back pain or abdominal pain -Continue MiraLAX twice daily can consider dulcolax suppository   Thank you for your kind consultation, we will continue to follow.         HPI:   DAIANNA [name] is a 77 y.o. female with past medical history significant for hypertension, morbid obesity, diabetes, hyperlipidemia, OSA, CKD stage III, CAD status post CABG 2016, CVA secondary to high-grade ICA stenosis status post stent August 2022, CHF status post AICD, COPD with chronic oxygen use, chronic antiplatelet therapy with Plavix and aspirin presents with acute pancreatitis but found to have worsening anemia and FOBT +.  04/26/2021 admission for abdominal pain status post ORIF left hip 2/23. History of vascular disease and SMA stenosis but not critical possible contributing factor.  Abdominal wall fibrosis seen, patient on opioids for chronic pain thought possibly worsening constipation or gastroparesis, maximize bowel regiment with improvement of pain and was discharged home. Since that time patient's had thoracic pedicle screw placement 09/10/2021 as well of recent nail removal with conversion to total left hip 02/12/2022 and has been in rehab since that time.  States she started having worsening generalized abdominal pain from her baseline over the last 3 weeks with anorexia and nausea. States she has been having epigastric abdominal pain as well as lower abdominal pain. Denies vomiting.  Has been on MiraLAX twice daily, states Linzess caused diarrhea and has stopped.  Last bowel movement was yesterday.   Patient denies any melena or hematochezia.    She remains on Protonix twice daily.   Has been on Tylenol, denies NSAIDs or prednisone use, alcohol.   Last Plavix was Sunday or monday has been on Lovenox since admission.  Patient hemodynamically stable in the ER 100% on 3 L , labs showed hypokalemia 2.9, hyperglycemia 169, BUN 45, creatinine 2.38, alk phos 173, albumin 2.6 Patient had lipase 1500 CBC Hgb 7.9 close to baseline Anemia panel showed iron 38, TIBC 213, ferritin 738, sats 18  CT abdomen pelvis showed 2 right renal masses increasing in size from previous CT , Small lung base nodules, Rectum moderately distended with stool, pancreas unremarkable no inflammation. Right upper quadrant [place] prior cholecystectomy, hepatic steatosis, right renal cyst collation with 6-week follow-up contrast CT  Patient's had 8 baseline hemoglobin for several months, 01/05 hemoglobin 6.9 status post 2 units 8.1, has been between 7.5 and 8.1, on admission 7.0, currently 7.4 pending 1 PRBC.  Patient FOBT positive.   No family was present at the time of my evaluation.  Previous GI work up: 12/05/2020 barium swallow showed stricture in distal esophagus similar to comparison exam however barium tablet passed GE junction, findings suggestive of reflux induced stricture of the distal esophagus.  No mucosal irregularities.   Barium esophagram November 04, 2019: Mild press by esophagus with good primary peristaltic wave.  Smooth narrowing of distal esophagus preventing passage of 13 mm tablet   EGD and colonoscopy in November 2019.  EGD revealed grade B esophagitis, small hiatal hernia, gastritis, and a  small nodule in the stomach.   Gastric biopsies showed no H. pylori.  Showed reactive gastropathy with ulceration. Colonoscopy revealed polyps that were removed and were tubular adenomas.  Also had diverticulosis and internal/external hemorrhoids.   EGD December 14, 2006 showed mild antral gastritis otherwise normal exam.  H. pylori CLOtest negative.    Colonoscopy September 27, 2002 by Dr. Olevia Perches with removal of small diminutive hyperplastic polyps and left-sided diverticulosis  Abnormal ED labs: Abnormal Labs Reviewed  CBC WITH DIFFERENTIAL/PLATELET - Abnormal; Notable for the following components:      Result Value   RBC 2.74 (*)    Hemoglobin 7.9 (*)    HCT 26.7 (*)    MCHC 29.6 (*)    All other components within normal limits  COMPREHENSIVE METABOLIC PANEL - Abnormal; Notable for the following components:   Potassium 2.9 (*)    Chloride 90 (*)    Glucose, Bld 169 (*)    BUN 45 (*)    Creatinine, Ser 2.38 (*)    Total Protein 6.1 (*)    Albumin 2.6 (*)    Alkaline Phosphatase 173 (*)    GFR, Estimated 20 (*)    Anion gap 16 (*)    All other components within normal limits  LIPASE, BLOOD - Abnormal; Notable for the following components:   Lipase 1,308 (*)    All other components within normal limits  URINALYSIS, ROUTINE W REFLEX MICROSCOPIC - Abnormal; Notable for the following components:   Protein, ur 30 (*)    Leukocytes,Ua TRACE (*)    Bacteria, UA RARE (*)    All other components within normal limits  BASIC METABOLIC PANEL - Abnormal; Notable for the following components:   CO2 33 (*)    Glucose, Bld 124 (*)    BUN 42 (*)    Creatinine, Ser 2.22 (*)    GFR, Estimated 22 (*)    All other components within normal limits  CBC - Abnormal; Notable for the following components:   RBC 2.50 (*)    Hemoglobin 7.0 (*)    HCT 24.4 (*)    MCHC 28.7 (*)    Platelets 148 (*)    All other components within normal limits  LIPASE, BLOOD - Abnormal; Notable for the following components:   Lipase 529 (*)    All other components within normal limits  GLUCOSE, CAPILLARY - Abnormal; Notable for the following components:   Glucose-Capillary 120 (*)    All other components within normal limits  GLUCOSE, CAPILLARY - Abnormal; Notable for the following components:   Glucose-Capillary 117 (*)    All other components within normal  limits  GLUCOSE, CAPILLARY - Abnormal; Notable for the following components:   Glucose-Capillary 103 (*)    All other components within normal limits  GLUCOSE, CAPILLARY - Abnormal; Notable for the following components:   Glucose-Capillary 121 (*)    All other components within normal limits  OCCULT BLOOD X 1 CARD TO LAB, STOOL - Abnormal; Notable for the following components:   Fecal Occult Bld POSITIVE (*)    All other components within normal limits  GLUCOSE, CAPILLARY - Abnormal; Notable for the following components:   Glucose-Capillary 219 (*)    All other components within normal limits  LIPASE, BLOOD - Abnormal; Notable for the following components:   Lipase 382 (*)    All other components within normal limits  CBC WITH DIFFERENTIAL/PLATELET - Abnormal; Notable for the following components:   RBC 2.53 (*)  Hemoglobin 7.4 (*)    HCT 24.3 (*)    Platelets 134 (*)    All other components within normal limits  BASIC METABOLIC PANEL - Abnormal; Notable for the following components:   Potassium 3.4 (*)    Glucose, Bld 101 (*)    BUN 38 (*)    Creatinine, Ser 1.93 (*)    GFR, Estimated 26 (*)    All other components within normal limits  GLUCOSE, CAPILLARY - Abnormal; Notable for the following components:   Glucose-Capillary 109 (*)    All other components within normal limits     Past Medical History:  Diagnosis Date   AICD (automatic cardioverter/defibrillator) present    downgraded to CRT-P in 2014   Anemia    Arthritis    Asthma    Atrial fibrillation ([place]) 10/24/2015   Questionable history of A Fib/A Flutter. On device interrogation on 9/7, showed 0.98mn of A Fib/A Flutter with 1% burden.  Device check in Jan 2018 showed no sustained AF (runs of less than 30 seconds). No anticoagulation indicated.   CAD (coronary artery disease)    a. 10/09/16 LHC: SVG->LAD patent, SVG->Diag patent, SVG->RCA patent, SVG->LCx occluded. EF 60%, b. 10/31/16 LHC DES to AV groove Circ,  DES to intermed branch   Chronic lower back pain    Fatty liver disease, nonalcoholic    GERD (gastroesophageal reflux disease)    Gout    H/O hiatal hernia    High cholesterol    Hyperplastic colon polyp    Hypertension    IBS (irritable bowel syndrome)    Internal hemorrhoids    INTERNAL HEMORRHOIDS WITHOUT MENTION COMP 04/12/2007   Qualifier: Diagnosis of  By: BOlevia PerchesMD, Dora M    [place]mic [place] 11/22/[place]   Pt responded to BiV ICD- last EF 55-60% Sept 2017   Obesity    OSA (obstructive sleep apnea)    "can't tolerate a mask", wears 2L nocturnal O2   Presence of permanent cardiac pacemaker    11/02/12 [employer] V273 INTUA PPM   Stroke ([facility] 09/2020   Type II diabetes mellitus ([place]     Surgical History:  She  has a past surgical history that includes Appendectomy; Tubal ligation; Coronary angiogram (2010); ABDOMINAL ULTRASOUND (12/01/2011); LEXISCAN MYOVIEW (11/14/2011); transthoracic echocardiogram (07/23/2012); left heart catheterization with coronary angiogram (N/A, 07/23/2012); Biv pacemaker generator change out (N/A, 11/02/2012); Coronary artery bypass graft (N/A, 11/29/2014); TEE without cardioversion (N/A, 11/29/2014); RIGHT/LEFT HEART CATH AND CORONARY ANGIOGRAPHY (N/A, 10/09/2016); Knee arthroscopy (Bilateral); Fracture surgery; Ankle fracture surgery (Right); Anterior cervical decomp/discectomy fusion; Back surgery; Cardiac catheterization (05/17/1999); Cardiac catheterization (07/08/2002); Cardiac catheterization (Bilateral, 04/26/2007); Cardiac catheterization (02/18/2008); Cardiac catheterization (07/23/2012); Cardiac catheterization (N/A, 11/24/2014); Cardiac catheterization (11/27/2014); Cardiac catheterization (10/09/2016); Insert / replace / remove pacemaker (1999); BIV ICD INSERTION CRT-D (2001?); CORONARY STENT INTERVENTION (N/A, 10/31/2016); LEFT HEART CATH AND CORS/GRAFTS ANGIOGRAPHY (N/A, 12/09/2017); Colonoscopy with propofol (N/A, 12/29/2017);  Esophagogastroduodenoscopy (egd) with propofol (N/A, 12/29/2017); biopsy (12/29/2017); polypectomy (12/29/2017); Cholecystectomy (N/A, 04/09/2018); RIGHT/LEFT HEART CATH AND CORONARY/GRAFT ANGIOGRAPHY (N/A, 03/11/2019); Transcarotid artery revascularization (Right, 10/01/2020); Ultrasound guidance for vascular access (Left, 10/01/2020); PPM GENERATOR CHANGEOUT (N/A, 12/31/2020); Intramedullary (im) nail intertrochanteric (Left, 04/04/2021); IR [place] Vasc Access Left (04/11/2021); IR Fluoro Guide CV Line Left (04/11/2021); Lumbar percutaneous pedicle screw 4 level (N/A, 09/10/2021); and Conversion to total hip (Left, 02/12/2022). Family History:  Her family history includes Asthma in her sister; Breast cancer in her mother; Diabetes in her maternal grandmother and mother; Glaucoma in her maternal  aunt; Heart disease in her maternal aunt and maternal grandmother; Kidney disease in her maternal grandmother. Social History:   reports that she quit smoking about 54 years ago. Her smoking use included cigarettes. She has a 0.75 pack-year smoking history. She has never used smokeless tobacco. She reports that she does not drink alcohol and does not use drugs.  Prior to Admission medications   Medication Sig Start Date End Date Taking? Authorizing Provider  acetaminophen (TYLENOL) 500 MG tablet Take 1,000 mg by mouth 3 (three) times daily as needed for headache (pain).   Yes [provider]  albuterol (PROVENTIL HFA;VENTOLIN HFA) 108 (90 Base) MCG/ACT inhaler Inhale 2 puffs into the lungs every 6 (six) hours as needed for wheezing or shortness of breath. 05/02/18  Yes Fuller Plan A, MD  albuterol (PROVENTIL) (2.5 MG/3ML) 0.083% nebulizer solution Take 3 mLs (2.5 mg total) by nebulization every 6 (six) hours as needed for wheezing or shortness of breath. 05/02/18  Yes Fuller Plan A, MD  allopurinol (ZYLOPRIM) 100 MG tablet Take 1 tablet (100 mg total) by mouth daily. Please contact the office to schedule  appointment for additional refills. 1st Attempt. Patient taking differently: Take 100 mg by mouth daily. 12/05/21  Yes Croitoru, [name], MD  aspirin EC 81 MG tablet Take 1 tablet (81 mg total) by mouth daily. 03/27/16  Yes Kilroy, Luke K, PA-C  barrier cream (NON-SPECIFIED) CREA Apply 1 Application topically See admin instructions. Apply topically to buttocks every shift and as needed for each incontinence episode.   Yes [provider]  budesonide-formoterol (SYMBICORT) 160-4.5 MCG/ACT inhaler Inhale 1 puff into the lungs 2 (two) times daily.   Yes [provider]  Cal Carb-Mag Hydrox-Simeth (MYLANTA COAT & COOL) 1200-270-80 MG/10ML SUSP Take 30 mLs by mouth 3 (three) times daily as needed (gas pains).   Yes [provider]  carboxymethylcellulose (REFRESH PLUS) 0.5 % SOLN Place 1 drop into both eyes 3 (three) times daily as needed (dry eyes).   Yes [provider]  clopidogrel (PLAVIX) 75 MG tablet Take 1 tablet (75 mg total) by mouth daily. 10/23/21  Yes Croitoru, [name], MD  cycloSPORINE (RESTASIS) 0.05 % ophthalmic emulsion Place 1 drop into both eyes 2 (two) times daily.   Yes [provider]  ferrous sulfate 325 (65 FE) MG tablet Take 1 tablet (325 mg total) by mouth daily with breakfast. 02/19/22 03/21/22 Yes Kc, Maren [place], MD  fluticasone (FLONASE) 50 MCG/ACT nasal spray Place 1 spray into both nostrils daily as needed for allergies.   Yes [provider]  folic acid (FOLVITE) 1 MG tablet Take 1 tablet (1 mg total) by mouth daily. 02/19/22 03/21/22 Yes Antonieta Pert, MD  furosemide (LASIX) 40 MG tablet Take 1 tablet (40 mg total) by mouth 2 (two) times daily. 5 day course. (02/26/22-03/02/22) Patient taking differently: Take 40 mg by mouth 2 (two) times daily. 8/36/62  Yes Delora Fuel, MD  hydrOXYzine (ATARAX) 25 MG tablet Take 25 mg by mouth 2 (two) times daily as needed for itching.   Yes [provider]  isosorbide mononitrate (IMDUR) 30 MG 24  hr tablet Take 3 tablets (90 mg total) by mouth daily. 10/23/21  Yes Croitoru, [name], MD  Lido-PE-Glycerin-Petrolatum (PREPARATION H RAPID RELIEF RE) Place 1 spray rectally every 6 (six) hours as needed (hemorrhoids).   Yes [provider]  lidocaine 4 % Place 1 patch onto the skin daily as needed (pain).   Yes [provider]  linaclotide Rolan Lipa) 290  MCG CAPS capsule Take 290 mcg by mouth daily before breakfast.   Yes [provider]  methocarbamol (ROBAXIN) 500 MG tablet Take 500 mg by mouth every 6 (six) hours as needed for muscle spasms.   Yes [provider]  nitroGLYCERIN (NITROSTAT) 0.4 MG SL tablet Place 1 tablet (0.4 mg total) under the tongue every 5 (five) minutes as needed for chest pain. Patient taking differently: Place 0.4 mg under the tongue every 5 (five) minutes x 3 doses as needed for chest pain. 11/13/16 03/18/22 Yes Kilroy, Luke K, PA-C  ondansetron (ZOFRAN) 4 MG tablet Take 4 mg by mouth daily as needed for nausea or vomiting.   Yes [provider]  OXYGEN Inhale 2-3 L/min into the lungs continuous.   Yes [provider]  oxymetazoline (AFRIN) 0.05 % nasal spray Place 1 spray into both nostrils 2 (two) times daily as needed for congestion.   Yes [provider]  pantoprazole (PROTONIX) 40 MG tablet Take 1 tablet (40 mg total) by mouth 2 (two) times daily. 07/08/19  Yes Nandigam, Venia Minks, MD  polyethylene glycol powder (GLYCOLAX/MIRALAX) 17 GM/SCOOP powder Take 17 g by mouth daily as needed (constipation).   Yes [provider]  rosuvastatin (CRESTOR) 20 MG tablet Take 1 tablet (20 mg total) by mouth every evening. NEED OV. Patient taking differently: Take 20 mg by mouth every evening. 10/23/21  Yes Croitoru, [name], MD  simethicone (MYLICON) 80 MG chewable tablet Chew 80 mg by mouth 4 (four) times daily as needed for flatulence.   Yes [provider]  sucralfate (CARAFATE) 1 g tablet Take 1 tablet (1 g  total) by mouth 4 (four) times daily -  with meals and at bedtime. Patient taking differently: Take 1 g by mouth 4 (four) times daily -  before meals and at bedtime. 02/19/22  Yes Antonieta Pert, MD  vitamin B-12 (CYANOCOBALAMIN) 1000 MCG tablet Take 1 tablet (1,000 mcg total) by mouth daily. 08/15/21  Yes Gherghe, Vella Redhead, MD  potassium chloride SA (KLOR-CON M) 20 MEQ tablet Take 1 tablet (20 mEq total) by mouth 2 (two) times daily. Patient not taking: Reported on 03/20/3149 7/61/60   Delora Fuel, MD    Current Facility-Administered Medications  Medication Dose Route Frequency Provider Last Rate Last Admin   0.9 %  sodium chloride infusion (Manually program via Guardrails IV Fluids)   Intravenous Once Elgergawy, Silver Huguenin, MD       acetaminophen (TYLENOL) tablet 650 mg  650 mg Oral Q6H PRN Mansy, Jan A, MD   650 mg at 03/18/22 2116   Or   acetaminophen (TYLENOL) suppository 650 mg  650 mg Rectal Q6H PRN Mansy, Jan A, MD       cycloSPORINE (RESTASIS) 0.05 % ophthalmic emulsion 1 drop  1 drop Both Eyes BID Alma Friendly, MD   1 drop at 03/18/22 2119   enoxaparin (LOVENOX) injection 30 mg  30 mg Subcutaneous Q24H Mansy, Jan A, MD   30 mg at 03/19/22 7371   ferrous sulfate tablet 325 mg  325 mg Oral Q breakfast Alma Friendly, MD   325 mg at 08/05/92 8546   folic acid (FOLVITE) tablet 1 mg  1 mg Oral Daily Alma Friendly, MD   1 mg at 03/19/22 0829   furosemide (LASIX) tablet 40 mg  40 mg Oral BID Alma Friendly, MD   40 mg at 03/19/22 0830   insulin aspart (novoLOG) injection 0-9 Units  0-9 Units Subcutaneous  Q6H Mansy, Arvella Merles, MD   3 Units at 03/18/22 1705   mometasone-formoterol (DULERA) 200-5 MCG/ACT inhaler 2 puff  2 puff Inhalation BID Alma Friendly, MD   2 puff at 03/19/22 0830   morphine (PF) 2 MG/ML injection 2 mg  2 mg Intravenous Q4H PRN Mansy, Jan A, MD   2 mg at 03/19/22 0151   ondansetron (ZOFRAN) tablet 4 mg  4 mg Oral Q6H PRN Mansy, Jan A, MD       Or    ondansetron [facility]) injection 4 mg  4 mg Intravenous Q6H PRN Mansy, Jan A, MD       pantoprazole (PROTONIX) injection 40 mg  40 mg Intravenous Q12H Elgergawy, Silver Huguenin, MD   40 mg at 03/19/22 0830   polyethylene glycol (MIRALAX / GLYCOLAX) packet 17 g  17 g Oral BID Alma Friendly, MD   17 g at 03/19/22 0831   senna-docusate (Senokot-S) tablet 1 tablet  1 tablet Oral BID Alma Friendly, MD   1 tablet at 03/19/22 0830   traZODone (DESYREL) tablet 25 mg  25 mg Oral QHS PRN Mansy, Jan A, MD   25 mg at 03/17/22 2311    Allergies as of 03/17/2022 - Review Complete 03/17/2022  Allergen Reaction Noted   Ivp dye [iodinated contrast media] Shortness Of Breath 10/13/2010   Shellfish allergy Anaphylaxis 11/20/2017   Sulfa antibiotics Shortness Of Breath    Iodine Hives 10/13/2010   Benadryl [diphenhydramine] Other (See Comments) 05/31/2019   Lipitor [atorvastatin] Other (See Comments) 11/12/2015   Mitigare [colchicine] Nausea And Vomiting 12/20/2019   Uloric [febuxostat] Other (See Comments) 04/01/2020   Vibra-tab [doxycycline] Other (See Comments) 04/18/2017   Keflex [cephalexin] Itching and Rash 04/30/2015   Zithromax [azithromycin] Rash 06/25/2014    Review of Systems:    Constitutional: No weight loss, fever, chills, weakness or fatigue HEENT: Eyes: No change in vision               Ears, Nose, Throat:  No change in hearing or congestion Skin: No rash or itching Cardiovascular: No chest pain, chest pressure or palpitations   Respiratory: No SOB or cough Gastrointestinal: See HPI and otherwise negative Genitourinary: No dysuria or change in urinary frequency Neurological: No headache, dizziness or syncope Musculoskeletal: No new muscle or joint pain Hematologic: No bleeding or bruising Psychiatric: No history of depression or anxiety     Physical Exam:  Vital signs in last 24 hours: Temp:  [97.7 F (36.5 C)-97.9 F (36.6 C)] 97.8 F (36.6 C) (02/07 0801) Pulse Rate:   [79-85] 79 (02/07 0830) Resp:  [16-18] 18 (02/07 0830) BP: (145-153)/(60-80) 153/62 (02/07 0801) SpO2:  [94 %-100 %] 99 % (02/07 0830) FiO2 (%):  [32 %] 32 % (02/07 0830) Last BM Date : 03/19/22 Last BM recorded by nurses in past 5 days Stool Type: Type 4 (Like a smooth, soft sausage or snake) (03/19/2022  8:29 AM)  General:   Pleasant, morbidly obese female on nasal cannula. Head:  Normocephalic and atraumatic. Eyes: sclerae anicteric,conjunctive pale  Heart:  regular rate and rhythm Pulm: Decreased breath sounds, patient on nasal cannula, 3 L which is baseline. Abdomen:  Soft, Obese AB, skin exam normal, Sluggish bowel sounds. mild tenderness in the epigastrium. With guarding and Without rebound, . Extremities:  bilateral edema Msk:  Symmetrical without gross deformities. Peripheral pulses intact.  Neurologic:  Alert and  oriented x4;  grossly normal neurologically. Skin:   Dry and intact without significant  lesions or rashes. Psychiatric: Demonstrates good judgement and reason without abnormal affect or behaviors.  LAB RESULTS: Recent Labs    03/17/22 1545 03/18/22 0538 03/19/22 0448  WBC 8.0 5.7 4.9  HGB 7.9* 7.0* 7.4*  HCT 26.7* 24.4* 24.3*  PLT 180 148* 134*   BMET Recent Labs    03/17/22 1545 03/18/22 0538 03/19/22 0448  NA 138 141 138  K 2.9* 3.5 3.4*  CL 90* 99 100  CO2 32 33* 29  GLUCOSE 169* 124* 101*  BUN 45* 42* 38*  CREATININE 2.38* 2.22* 1.93*  CALCIUM 9.4 8.9 8.9   LFT Recent Labs    03/17/22 1545  PROT 6.1*  ALBUMIN 2.6*  AST 25  ALT 18  ALKPHOS 173*  BILITOT 0.5   PT/INR No results for input(s): "LABPROT", "INR" in the last 72 hours.  STUDIES: US Abdomen Limited RUQ (LIVER/GB)  Result Date: 03/17/2022 CLINICAL DATA:  History of prior cholecystectomy. Acute pancreatitis. EXAM: ULTRASOUND ABDOMEN LIMITED RIGHT UPPER QUADRANT COMPARISON:  None Available. FINDINGS: Gallbladder: Over gallbladder is surgically absent. Common bile duct: Diameter:  9.3 mm Liver: No focal lesion identified. Diffusely increased echogenicity of the liver parenchyma is noted. Portal vein is patent on color Doppler imaging with normal direction of blood flow towards the liver. Other: Of incidental note is the presence of a 3.7 cm right renal cyst. IMPRESSION: 1. Findings consistent with history of prior cholecystectomy. 2. Hepatic steatosis without focal liver lesions. 3. Right renal cyst which spine to the cystic lesion seen within the right kidney on the prior abdomen pelvis CT (March 17, 2022). Correlation with 6 week follow-up contrast-enhanced abdomen and pelvis CT is recommended for improved characterization and to determine stability. Electronically Signed   By: Virgina Norfolk M.D.   On: 03/17/2022 21:20   CT ABDOMEN PELVIS WO CONTRAST  Result Date: 03/17/2022 CLINICAL DATA:  Abdominal pain for 3 days. EXAM: CT ABDOMEN AND PELVIS WITHOUT CONTRAST TECHNIQUE: Multidetector CT imaging of the abdomen and pelvis was performed following the standard protocol without IV contrast. RADIATION DOSE REDUCTION: This exam was performed according to the departmental dose-optimization program which includes automated exposure control, adjustment of the mA and/or kV according to patient size and/or use of iterative reconstruction technique. COMPARISON:  CT, 09/09/2021 and 04/21/2021. FINDINGS: Lower chest: 4 mm nodule, right middle lobe, image 4, series 8. 5 mm nodule, right lower lobe, image 12/8. Tiny nodule, right lower lobe, image 19/8. These nodules are stable from 09/09/2021. Mild dependent lower lobe atelectasis. No acute findings. Hepatobiliary: Of several small low-attenuation liver lesions, largest at the dome of segment 7, 1 cm size, nonspecific, likely cysts. No other liver abnormality. Gallbladder surgically absent. No bile duct dilation. Pancreas: Unremarkable. No pancreatic ductal dilatation or surrounding inflammatory changes. Spleen: Normal in size without focal  abnormality. Adrenals/Urinary Tract: No adrenal masses. Mild bilateral renal cortical thinning. Isoattenuating right renal masses, 1 protruding from the posterolateral upper pole, 1.4 cm, the other protruding medially from the lower pole, 2 cm. Exophytic small cyst from the posterolateral midpole the left kidney, 1.3 cm. Right renal masses have both increased in size from the prior CT. Left renal cyst is without significant change. No new masses, no stones and no hydronephrosis. Normal ureters. Bladder mostly decompressed, otherwise unremarkable. Stomach/Bowel: Stomach mostly decompressed, otherwise unremarkable. Small bowel and colon are normal in caliber. No wall thickening. No inflammation. Scattered colonic diverticula most numerous on the left. Rectum moderately distended with stool. Vascular/Lymphatic: Dense aortic atherosclerotic calcifications extending to  the branch vessels. No aneurysm. No enlarged lymph nodes. Reproductive: Uterus and bilateral adnexa are unremarkable. Other: No ascites. Musculoskeletal: No fracture or acute finding. Well-positioned left total hip arthroplasty. Partly imaged posterior fusion hardware from a thoracic spine fusion. Thoracic spine hardware is new since the prior study. Left hip arthroplasty replaces compression screw and intramedullary rod seen previously. IMPRESSION: 1. No acute findings within the abdomen or pelvis. 2. 2 isoattenuating right renal masses have increased in size from previous CT. Although these may still reflect complicated/hemorrhagic cysts, recommend further assessment with nonemergent renal ultrasound. 3. Small lung base nodules as detailed stable from the CT dated 09/09/2021, and from exam from 2019, benign with no follow-up recommended. 4. Aortic atherosclerosis. 5. Rectum moderately distended with stool. Electronically Signed   By: Lajean Manes M.D.   On: 03/17/2022 18:16   UE VENOUS DUPLEX (7am - 7pm)  Result Date: 03/17/2022 UPPER VENOUS STUDY   Patient Name:  TUMEKA CHIMENTI  Date of Exam:   03/17/2022 Medical Rec #: [id number]          Accession #:    [id number] Date of Birth: [birth year]           Patient Gender: F Patient Age:   47 years Exam Location:  [facility] Procedure:      VAS [place] UPPER EXTREMITY VENOUS DUPLEX Referring Phys: Herbie Baltimore LOCKWOOD --------------------------------------------------------------------------------  Indications: Pain, Edema, and numbness Limitations: Body habitus,PM placement, and poor ultrasound/tissue interface. Comparison Study: No previous UEV exams Performing Technologist: Jody Hill RVT, RDMS  Examination Guidelines: A complete evaluation includes B-mode imaging, spectral Doppler, color Doppler, and power Doppler as needed of all accessible portions of each vessel. Bilateral testing is considered an integral part of a complete examination. Limited examinations for reoccurring indications may be performed as noted.  Right Findings: +----------+------------+---------+-----------+----------+--------------------+ RIGHT     CompressiblePhasicitySpontaneousProperties      Summary        +----------+------------+---------+-----------+----------+--------------------+ IJV           Full       Yes       Yes                                   +----------+------------+---------+-----------+----------+--------------------+ Subclavian               Yes       Yes                   patent by                                                              color/doppler     +----------+------------+---------+-----------+----------+--------------------+ Axillary      Full       Yes       Yes                                   +----------+------------+---------+-----------+----------+--------------------+ Brachial      Full       Yes       Yes                                   +----------+------------+---------+-----------+----------+--------------------+  Radial        Full                                                        +----------+------------+---------+-----------+----------+--------------------+ Cephalic      Full                                    patent by color    +----------+------------+---------+-----------+----------+--------------------+ Basilic       Full       Yes       Yes                                   +----------+------------+---------+-----------+----------+--------------------+ Limited visualization of ulnar veins - patent by color only  Left Findings: +----------+------------+---------+-----------+----------+--------------------+ LEFT      CompressiblePhasicitySpontaneousProperties      Summary        +----------+------------+---------+-----------+----------+--------------------+ Subclavian               Yes       Yes                   patent by                                                              color/doppler     +----------+------------+---------+-----------+----------+--------------------+  Summary:  Right: No evidence of deep vein thrombosis in the upper extremity. No evidence of superficial vein thrombosis in the upper extremity.  Left: No evidence of thrombosis in the subclavian.  *See table(s) above for measurements and observations.  Diagnosing physician: Servando Snare MD Electronically signed by Servando Snare MD on 03/17/2022 at 5:37:48 PM.    Final      Vladimir Crofts  03/19/2022, 10:05 AM

## 2022-03-19 NOTE — H&P (View-Only) (Signed)
Attending physician's note   I have taken a history, reviewed the chart, and examined the patient. I performed a substantive portion of this encounter, including complete performance of at least one of the key components, in conjunction with the APP. I agree with the APP's note, impression, and recommendations with my edits.   77 year old female with multiple comorbidities as outlined below, admitted with acute on chronic abdominal pain, nausea, decreased appetite.  Initial concern for acute pancreatitis with lipase 1500, but CT with normal-appearing pancreas.  Lipase has since down trended to 382 today.  GI service consulted for acute on chronic anemia with Hgb 7.0 (baseline~8) and heme positive stool.  Denies any overt bleeding.  Transfused 1 unit PRBCs today.  History of GERD with LA Grade B erosive esophagitis on EGD in 2019.  No recent breakthrough reflux symptoms.  1) Acute on chronic anemia 2) Heme positive stool 3) Epigastric pain - EGD tomorrow to evaluate for mucosal/luminal pathology and potentially therapeutic intent - High-dose IV PPI for possible PUD - Posttransfusion CBC check - Continue serial CBC checks with additional blood products as needed per protocol - Liquid diet today with n.p.o. at midnight  4) CAD with history of CABG 5) CHF 6) History of CVA - Holding ASA/Plavix.  Last dose was 2/4 - Hold Lovenox  7) Constipation History of chronic constipation.  CT with moderately distended rectum with stool - Bowel regimen as outlined  8) COPD on home O2 - Elevated periprocedural risks due to underlying cardiopulmonary disease.  Plan for MAC assistance for EGD above   The indications, risks, and benefits of EGD were explained to the patient in detail. Risks include but are not limited to bleeding, perforation, adverse reaction to medications, and cardiopulmonary compromise. Sequelae include but are not limited to the possibility of surgery, hospitalization, and  mortality. The patient verbalized understanding and wished to proceed.    [address] DO, FACG [id number] office          Consultation  Referring Provider:   [facility] Primary Care Physician:  Lujean Amel, MD Primary Gastroenterologist:  Dr. Silverio Decamp       Reason for Consultation:   Anemia with FOBT +      Impression    Acute on chronic anemia, FOBT positive with epigastric abdominal pain EGD and colonoscopy 2019 grade B esophagitis, colonoscopy polyps, diverticulosis and hemorrhoids. Elevated lipase, CT without pancreatitis, likely this was from another process or falsely elevated, has been decreasing.  Chronic abdominal pain  CT abdomen pelvis unremarkable other than stool burden Vascular mesenteric ultrasound  with less than 70% stenosis of SMA in March Likely musculoskeletal, opioid use, chronic constipation.   Multiple comorbidities including coronary disease status post bypass chronic diastolic heart failure status post AICD Patient is on Plavix  last dose was Sunday or Monday, has been on Lovenox.  Will hold prior to endoscopic valuation.   Chronic hypoxic respiratory failure on oxygen therapy At her baseline at this time  Principal Problem:   Acute pancreatitis Active Problems:   Atrial fibrillation (HCC)   Hypokalemia   Dyslipidemia   GERD without esophagitis   Coronary artery disease    LOS: 2 days     Plan   Patient with likely multifactorial anemia with history of CKD, anemia of chronic disease, multiple surgeries most recently 1/03 however with FOBT positive and chronic epigastric pain discussed further with patient we will proceed with endoscopic evaluation tomorrow. We did discuss how patient is high  risk, with multiple comorbidities and she wishes to proceed. -Protonix 40 mg IV BID. -Clear liquid diet, NPO at midnight. --Continue to monitor H&H with transfusion as needed to maintain hemoglobin greater than 7. -Plan for EGD tomorrow per  patient preference. I thoroughly discussed the procedure to include nature, alternatives, benefits, and risks including but not limited to bleeding, perforation, infection, anesthesia/cardiac and pulmonary complications. Patient provides understanding and gave verbal consent to proceed. -Hold Lovenox prior to endoscopic evaluation -Suggest lidocaine patches for back pain or abdominal pain -Continue MiraLAX twice daily can consider dulcolax suppository   Thank you for your kind consultation, we will continue to follow.         HPI:   [name] is a 77 y.o. female with past medical history significant for hypertension, morbid obesity, diabetes, hyperlipidemia, OSA, CKD stage III, CAD status post CABG 2016, CVA secondary to high-grade ICA stenosis status post stent August 2022, CHF status post AICD, COPD with chronic oxygen use, chronic antiplatelet therapy with Plavix and aspirin presents with acute pancreatitis but found to have worsening anemia and FOBT +.  04/26/2021 admission for abdominal pain status post ORIF left hip 2/23. History of vascular disease and SMA stenosis but not critical possible contributing factor.  Abdominal wall fibrosis seen, patient on opioids for chronic pain thought possibly worsening constipation or gastroparesis, maximize bowel regiment with improvement of pain and was discharged home. Since that time patient's had thoracic pedicle screw placement 09/10/2021 as well of recent nail removal with conversion to total left hip 02/12/2022 and has been in rehab since that time.  States she started having worsening generalized abdominal pain from her baseline over the last 3 weeks with anorexia and nausea. States she has been having epigastric abdominal pain as well as lower abdominal pain. Denies vomiting.  Has been on MiraLAX twice daily, states Linzess caused diarrhea and has stopped.  Last bowel movement was yesterday.   Patient denies any melena or hematochezia.    She remains on Protonix twice daily.   Has been on Tylenol, denies NSAIDs or prednisone use, alcohol.   Last Plavix was Sunday or monday has been on Lovenox since admission.  Patient hemodynamically stable in the ER 100% on 3 L [place], labs showed hypokalemia 2.9, hyperglycemia 169, BUN 45, creatinine 2.38, alk phos 173, albumin 2.6 Patient had lipase 1500 CBC Hgb 7.9 close to baseline Anemia panel showed iron 38, TIBC 213, ferritin 738, sats 18  CT abdomen pelvis showed 2 right renal masses increasing in size from previous CT , Small lung base nodules, Rectum moderately distended with stool, pancreas unremarkable no inflammation. Right upper quadrant [place] prior cholecystectomy, hepatic steatosis, right renal cyst collation with 6-week follow-up contrast CT  Patient's had 8 baseline hemoglobin for several months, 01/05 hemoglobin 6.9 status post 2 units 8.1, has been between 7.5 and 8.1, on admission 7.0, currently 7.4 pending 1 PRBC.  Patient FOBT positive.   No family was present at the time of my evaluation.  Previous GI work up: 12/05/2020 barium swallow showed stricture in distal esophagus similar to comparison exam however barium tablet passed GE junction, findings suggestive of reflux induced stricture of the distal esophagus.  No mucosal irregularities.   Barium esophagram November 04, 2019: Mild press by esophagus with good primary peristaltic wave.  Smooth narrowing of distal esophagus preventing passage of 13 mm tablet   EGD and colonoscopy in November 2019.  EGD revealed grade B esophagitis, small hiatal hernia, gastritis, and a  small nodule in the stomach.   Gastric biopsies showed no H. pylori.  Showed reactive gastropathy with ulceration. Colonoscopy revealed polyps that were removed and were tubular adenomas.  Also had diverticulosis and internal/external hemorrhoids.   EGD December 14, 2006 showed mild antral gastritis otherwise normal exam.  H. pylori CLOtest negative.    Colonoscopy September 27, 2002 by Dr. Olevia Perches with removal of small diminutive hyperplastic polyps and left-sided diverticulosis  Abnormal ED labs: Abnormal Labs Reviewed  CBC WITH DIFFERENTIAL/PLATELET - Abnormal; Notable for the following components:      Result Value   RBC 2.74 (*)    Hemoglobin 7.9 (*)    HCT 26.7 (*)    MCHC 29.6 (*)    All other components within normal limits  COMPREHENSIVE METABOLIC PANEL - Abnormal; Notable for the following components:   Potassium 2.9 (*)    Chloride 90 (*)    Glucose, Bld 169 (*)    BUN 45 (*)    Creatinine, Ser 2.38 (*)    Total Protein 6.1 (*)    Albumin 2.6 (*)    Alkaline Phosphatase 173 (*)    GFR, Estimated 20 (*)    Anion gap 16 (*)    All other components within normal limits  LIPASE, BLOOD - Abnormal; Notable for the following components:   Lipase 1,308 (*)    All other components within normal limits  URINALYSIS, ROUTINE W REFLEX MICROSCOPIC - Abnormal; Notable for the following components:   Protein, ur 30 (*)    Leukocytes,Ua TRACE (*)    Bacteria, UA RARE (*)    All other components within normal limits  BASIC METABOLIC PANEL - Abnormal; Notable for the following components:   CO2 33 (*)    Glucose, Bld 124 (*)    BUN 42 (*)    Creatinine, Ser 2.22 (*)    GFR, Estimated 22 (*)    All other components within normal limits  CBC - Abnormal; Notable for the following components:   RBC 2.50 (*)    Hemoglobin 7.0 (*)    HCT 24.4 (*)    MCHC 28.7 (*)    Platelets 148 (*)    All other components within normal limits  LIPASE, BLOOD - Abnormal; Notable for the following components:   Lipase 529 (*)    All other components within normal limits  GLUCOSE, CAPILLARY - Abnormal; Notable for the following components:   Glucose-Capillary 120 (*)    All other components within normal limits  GLUCOSE, CAPILLARY - Abnormal; Notable for the following components:   Glucose-Capillary 117 (*)    All other components within normal  limits  GLUCOSE, CAPILLARY - Abnormal; Notable for the following components:   Glucose-Capillary 103 (*)    All other components within normal limits  GLUCOSE, CAPILLARY - Abnormal; Notable for the following components:   Glucose-Capillary 121 (*)    All other components within normal limits  OCCULT BLOOD X 1 CARD TO LAB, STOOL - Abnormal; Notable for the following components:   Fecal Occult Bld POSITIVE (*)    All other components within normal limits  GLUCOSE, CAPILLARY - Abnormal; Notable for the following components:   Glucose-Capillary 219 (*)    All other components within normal limits  LIPASE, BLOOD - Abnormal; Notable for the following components:   Lipase 382 (*)    All other components within normal limits  CBC WITH DIFFERENTIAL/PLATELET - Abnormal; Notable for the following components:   RBC 2.53 (*)  Hemoglobin 7.4 (*)    HCT 24.3 (*)    Platelets 134 (*)    All other components within normal limits  BASIC METABOLIC PANEL - Abnormal; Notable for the following components:   Potassium 3.4 (*)    Glucose, Bld 101 (*)    BUN 38 (*)    Creatinine, Ser 1.93 (*)    GFR, Estimated 26 (*)    All other components within normal limits  GLUCOSE, CAPILLARY - Abnormal; Notable for the following components:   Glucose-Capillary 109 (*)    All other components within normal limits     Past Medical History:  Diagnosis Date   AICD (automatic cardioverter/defibrillator) present    downgraded to CRT-P in 2014   Anemia    Arthritis    Asthma    Atrial fibrillation ([place]) 10/24/2015   Questionable history of A Fib/A Flutter. On device interrogation on 9/7, showed 0.41mn of A Fib/A Flutter with 1% burden.  Device check in Jan 2018 showed no sustained AF (runs of less than 30 seconds). No anticoagulation indicated.   CAD (coronary artery disease)    a. 10/09/16 LHC: SVG->LAD patent, SVG->Diag patent, SVG->RCA patent, SVG->LCx occluded. EF 60%, b. 10/31/16 LHC DES to AV groove Circ,  DES to intermed branch   Chronic lower back pain    Fatty liver disease, nonalcoholic    GERD (gastroesophageal reflux disease)    Gout    H/O hiatal hernia    High cholesterol    Hyperplastic colon polyp    Hypertension    IBS (irritable bowel syndrome)    Internal hemorrhoids    INTERNAL HEMORRHOIDS WITHOUT MENTION COMP 04/12/2007   Qualifier: Diagnosis of  By: BOlevia PerchesMD, Dora M    [place]ischemic cardiomyopathy [place] 11/22/2011   Pt responded to BiV ICD- last EF 55-60% Sept 2017   Obesity    OSA (obstructive sleep apnea)    "can't tolerate a mask", wears 2L nocturnal O2   Presence of permanent cardiac pacemaker    11/02/12 [employer] V273 INTUA PPM   Stroke ([facility] 09/2020   Type II diabetes mellitus ([place]     Surgical History:  She  has a past surgical history that includes Appendectomy; Tubal ligation; Coronary angiogram (2010); ABDOMINAL ULTRASOUND (12/01/2011); LEXISCAN MYOVIEW (11/14/2011); transthoracic echocardiogram (07/23/2012); left heart catheterization with coronary angiogram (N/A, 07/23/2012); Biv pacemaker generator change out (N/A, 11/02/2012); Coronary artery bypass graft (N/A, 11/29/2014); TEE without cardioversion (N/A, 11/29/2014); RIGHT/LEFT HEART CATH AND CORONARY ANGIOGRAPHY (N/A, 10/09/2016); Knee arthroscopy (Bilateral); Fracture surgery; Ankle fracture surgery (Right); Anterior cervical decomp/discectomy fusion; Back surgery; Cardiac catheterization (05/17/1999); Cardiac catheterization (07/08/2002); Cardiac catheterization (Bilateral, 04/26/2007); Cardiac catheterization (02/18/2008); Cardiac catheterization (07/23/2012); Cardiac catheterization (N/A, 11/24/2014); Cardiac catheterization (11/27/2014); Cardiac catheterization (10/09/2016); Insert / replace / remove pacemaker (1999); BIV ICD INSERTION CRT-D (2001?); CORONARY STENT INTERVENTION (N/A, 10/31/2016); LEFT HEART CATH AND CORS/GRAFTS ANGIOGRAPHY (N/A, 12/09/2017); Colonoscopy with propofol (N/A, 12/29/2017);  Esophagogastroduodenoscopy (egd) with propofol (N/A, 12/29/2017); biopsy (12/29/2017); polypectomy (12/29/2017); Cholecystectomy (N/A, 04/09/2018); RIGHT/LEFT HEART CATH AND CORONARY/GRAFT ANGIOGRAPHY (N/A, 03/11/2019); Transcarotid artery revascularization (Right, 10/01/2020); Ultrasound guidance for vascular access (Left, 10/01/2020); PPM GENERATOR CHANGEOUT (N/A, 12/31/2020); Intramedullary (im) nail intertrochanteric (Left, 04/04/2021); IR [place] Vasc Access Left (04/11/2021); IR Fluoro Guide CV Line Left (04/11/2021); Lumbar percutaneous pedicle screw 4 level (N/A, 09/10/2021); and Conversion to total hip (Left, 02/12/2022). Family History:  Her family history includes Asthma in her sister; Breast cancer in her mother; Diabetes in her maternal grandmother and mother; Glaucoma in her maternal  aunt; Heart disease in her maternal aunt and maternal grandmother; Kidney disease in her maternal grandmother. Social History:   reports that she quit smoking about 54 years ago. Her smoking use included cigarettes. She has a 0.75 pack-year smoking history. She has never used smokeless tobacco. She reports that she does not drink alcohol and does not use drugs.  Prior to Admission medications   Medication Sig Start Date End Date Taking? Authorizing Provider  acetaminophen (TYLENOL) 500 MG tablet Take 1,000 mg by mouth 3 (three) times daily as needed for headache (pain).   Yes [provider]  albuterol (PROVENTIL HFA;VENTOLIN HFA) 108 (90 Base) MCG/ACT inhaler Inhale 2 puffs into the lungs every 6 (six) hours as needed for wheezing or shortness of breath. 05/02/18  Yes Fuller Plan A, MD  albuterol (PROVENTIL) (2.5 MG/3ML) 0.083% nebulizer solution Take 3 mLs (2.5 mg total) by nebulization every 6 (six) hours as needed for wheezing or shortness of breath. 05/02/18  Yes Fuller Plan A, MD  allopurinol (ZYLOPRIM) 100 MG tablet Take 1 tablet (100 mg total) by mouth daily. Please contact the office to schedule  appointment for additional refills. 1st Attempt. Patient taking differently: Take 100 mg by mouth daily. 12/05/21  Yes Croitoru, [name], MD  aspirin EC 81 MG tablet Take 1 tablet (81 mg total) by mouth daily. 03/27/16  Yes Kilroy, Luke K, PA-C  barrier cream (NON-SPECIFIED) CREA Apply 1 Application topically See admin instructions. Apply topically to buttocks every shift and as needed for each incontinence episode.   Yes [provider]  budesonide-formoterol (SYMBICORT) 160-4.5 MCG/ACT inhaler Inhale 1 puff into the lungs 2 (two) times daily.   Yes [provider]  Cal Carb-Mag Hydrox-Simeth (MYLANTA COAT & COOL) 1200-270-80 MG/10ML SUSP Take 30 mLs by mouth 3 (three) times daily as needed (gas pains).   Yes [provider]  carboxymethylcellulose (REFRESH PLUS) 0.5 % SOLN Place 1 drop into both eyes 3 (three) times daily as needed (dry eyes).   Yes [provider]  clopidogrel (PLAVIX) 75 MG tablet Take 1 tablet (75 mg total) by mouth daily. 10/23/21  Yes Croitoru, [name], MD  cycloSPORINE (RESTASIS) 0.05 % ophthalmic emulsion Place 1 drop into both eyes 2 (two) times daily.   Yes [provider]  ferrous sulfate 325 (65 FE) MG tablet Take 1 tablet (325 mg total) by mouth daily with breakfast. 02/19/22 03/21/22 Yes Kc, Maren [place], MD  fluticasone (FLONASE) 50 MCG/ACT nasal spray Place 1 spray into both nostrils daily as needed for allergies.   Yes [provider]  folic acid (FOLVITE) 1 MG tablet Take 1 tablet (1 mg total) by mouth daily. 02/19/22 03/21/22 Yes Antonieta Pert, MD  furosemide (LASIX) 40 MG tablet Take 1 tablet (40 mg total) by mouth 2 (two) times daily. 5 day course. (02/26/22-03/02/22) Patient taking differently: Take 40 mg by mouth 2 (two) times daily. 6/38/75  Yes Delora Fuel, MD  hydrOXYzine (ATARAX) 25 MG tablet Take 25 mg by mouth 2 (two) times daily as needed for itching.   Yes [provider]  isosorbide mononitrate (IMDUR) 30 MG 24  hr tablet Take 3 tablets (90 mg total) by mouth daily. 10/23/21  Yes Croitoru, [name], MD  Lido-PE-Glycerin-Petrolatum (PREPARATION H RAPID RELIEF RE) Place 1 spray rectally every 6 (six) hours as needed (hemorrhoids).   Yes [provider]  lidocaine 4 % Place 1 patch onto the skin daily as needed (pain).   Yes [provider]  linaclotide Rolan Lipa) 290  MCG CAPS capsule Take 290 mcg by mouth daily before breakfast.   Yes [provider]  methocarbamol (ROBAXIN) 500 MG tablet Take 500 mg by mouth every 6 (six) hours as needed for muscle spasms.   Yes [provider]  nitroGLYCERIN (NITROSTAT) 0.4 MG SL tablet Place 1 tablet (0.4 mg total) under the tongue every 5 (five) minutes as needed for chest pain. Patient taking differently: Place 0.4 mg under the tongue every 5 (five) minutes x 3 doses as needed for chest pain. 11/13/16 03/18/22 Yes Kilroy, Luke K, PA-C  ondansetron (ZOFRAN) 4 MG tablet Take 4 mg by mouth daily as needed for nausea or vomiting.   Yes [provider]  OXYGEN Inhale 2-3 L/min into the lungs continuous.   Yes [provider]  oxymetazoline (AFRIN) 0.05 % nasal spray Place 1 spray into both nostrils 2 (two) times daily as needed for congestion.   Yes [provider]  pantoprazole (PROTONIX) 40 MG tablet Take 1 tablet (40 mg total) by mouth 2 (two) times daily. 07/08/19  Yes Nandigam, Venia Minks, MD  polyethylene glycol powder (GLYCOLAX/MIRALAX) 17 GM/SCOOP powder Take 17 g by mouth daily as needed (constipation).   Yes [provider]  rosuvastatin (CRESTOR) 20 MG tablet Take 1 tablet (20 mg total) by mouth every evening. NEED OV. Patient taking differently: Take 20 mg by mouth every evening. 10/23/21  Yes Croitoru, [name], MD  simethicone (MYLICON) 80 MG chewable tablet Chew 80 mg by mouth 4 (four) times daily as needed for flatulence.   Yes [provider]  sucralfate (CARAFATE) 1 g tablet Take 1 tablet (1 g  total) by mouth 4 (four) times daily -  with meals and at bedtime. Patient taking differently: Take 1 g by mouth 4 (four) times daily -  before meals and at bedtime. 02/19/22  Yes Antonieta Pert, MD  vitamin B-12 (CYANOCOBALAMIN) 1000 MCG tablet Take 1 tablet (1,000 mcg total) by mouth daily. 08/15/21  Yes Gherghe, Vella Redhead, MD  potassium chloride SA (KLOR-CON M) 20 MEQ tablet Take 1 tablet (20 mEq total) by mouth 2 (two) times daily. Patient not taking: Reported on 03/17/9561 8/75/64   Delora Fuel, MD    Current Facility-Administered Medications  Medication Dose Route Frequency Provider Last Rate Last Admin   0.9 %  sodium chloride infusion (Manually program via Guardrails IV Fluids)   Intravenous Once Elgergawy, Silver Huguenin, MD       acetaminophen (TYLENOL) tablet 650 mg  650 mg Oral Q6H PRN Mansy, Jan A, MD   650 mg at 03/18/22 2116   Or   acetaminophen (TYLENOL) suppository 650 mg  650 mg Rectal Q6H PRN Mansy, Jan A, MD       cycloSPORINE (RESTASIS) 0.05 % ophthalmic emulsion 1 drop  1 drop Both Eyes BID Alma Friendly, MD   1 drop at 03/18/22 2119   enoxaparin (LOVENOX) injection 30 mg  30 mg Subcutaneous Q24H Mansy, Jan A, MD   30 mg at 03/19/22 3329   ferrous sulfate tablet 325 mg  325 mg Oral Q breakfast Alma Friendly, MD   325 mg at 51/88/41 6606   folic acid (FOLVITE) tablet 1 mg  1 mg Oral Daily Alma Friendly, MD   1 mg at 03/19/22 0829   furosemide (LASIX) tablet 40 mg  40 mg Oral BID Alma Friendly, MD   40 mg at 03/19/22 0830   insulin aspart (novoLOG) injection 0-9 Units  0-9 Units Subcutaneous  Q6H Mansy, Arvella Merles, MD   3 Units at 03/18/22 1705   mometasone-formoterol (DULERA) 200-5 MCG/ACT inhaler 2 puff  2 puff Inhalation BID Alma Friendly, MD   2 puff at 03/19/22 0830   morphine (PF) 2 MG/ML injection 2 mg  2 mg Intravenous Q4H PRN Mansy, Jan A, MD   2 mg at 03/19/22 0151   ondansetron (ZOFRAN) tablet 4 mg  4 mg Oral Q6H PRN Mansy, Jan A, MD       Or    ondansetron [facility]) injection 4 mg  4 mg Intravenous Q6H PRN Mansy, Jan A, MD       pantoprazole (PROTONIX) injection 40 mg  40 mg Intravenous Q12H Elgergawy, Silver Huguenin, MD   40 mg at 03/19/22 0830   polyethylene glycol (MIRALAX / GLYCOLAX) packet 17 g  17 g Oral BID Alma Friendly, MD   17 g at 03/19/22 0831   senna-docusate (Senokot-S) tablet 1 tablet  1 tablet Oral BID Alma Friendly, MD   1 tablet at 03/19/22 0830   traZODone (DESYREL) tablet 25 mg  25 mg Oral QHS PRN Mansy, Jan A, MD   25 mg at 03/17/22 2311    Allergies as of 03/17/2022 - Review Complete 03/17/2022  Allergen Reaction Noted   Ivp dye [iodinated contrast media] Shortness Of Breath 10/13/2010   Shellfish allergy Anaphylaxis 11/20/2017   Sulfa antibiotics Shortness Of Breath    Iodine Hives 10/13/2010   Benadryl [diphenhydramine] Other (See Comments) 05/31/2019   Lipitor [atorvastatin] Other (See Comments) 11/12/2015   Mitigare [colchicine] Nausea And Vomiting 12/20/2019   Uloric [febuxostat] Other (See Comments) 04/01/2020   Vibra-tab [doxycycline] Other (See Comments) 04/18/2017   Keflex [cephalexin] Itching and Rash 04/30/2015   Zithromax [azithromycin] Rash 06/25/2014    Review of Systems:    Constitutional: No weight loss, fever, chills, weakness or fatigue HEENT: Eyes: No change in vision               Ears, Nose, Throat:  No change in hearing or congestion Skin: No rash or itching Cardiovascular: No chest pain, chest pressure or palpitations   Respiratory: No SOB or cough Gastrointestinal: See HPI and otherwise negative Genitourinary: No dysuria or change in urinary frequency Neurological: No headache, dizziness or syncope Musculoskeletal: No new muscle or joint pain Hematologic: No bleeding or bruising Psychiatric: No history of depression or anxiety     Physical Exam:  Vital signs in last 24 hours: Temp:  [97.7 F (36.5 C)-97.9 F (36.6 C)] 97.8 F (36.6 C) (02/07 0801) Pulse Rate:   [79-85] 79 (02/07 0830) Resp:  [16-18] 18 (02/07 0830) BP: (145-153)/(60-80) 153/62 (02/07 0801) SpO2:  [94 %-100 %] 99 % (02/07 0830) FiO2 (%):  [32 %] 32 % (02/07 0830) Last BM Date : 03/19/22 Last BM recorded by nurses in past 5 days Stool Type: Type 4 (Like a smooth, soft sausage or snake) (03/19/2022  8:29 AM)  General:   Pleasant, morbidly obese female on nasal cannula. Head:  Normocephalic and atraumatic. Eyes: sclerae anicteric,conjunctive pale  Heart:  regular rate and rhythm Pulm: Decreased breath sounds, patient on nasal cannula, 3 L which is baseline. Abdomen:  Soft, Obese AB, skin exam normal, Sluggish bowel sounds. mild tenderness in the epigastrium. With guarding and Without rebound, . Extremities:  bilateral edema Msk:  Symmetrical without gross deformities. Peripheral pulses intact.  Neurologic:  Alert and  oriented x4;  grossly normal neurologically. Skin:   Dry and intact without significant  lesions or rashes. Psychiatric: Demonstrates good judgement and reason without abnormal affect or behaviors.  LAB RESULTS: Recent Labs    03/17/22 1545 03/18/22 0538 03/19/22 0448  WBC 8.0 5.7 4.9  HGB 7.9* 7.0* 7.4*  HCT 26.7* 24.4* 24.3*  PLT 180 148* 134*   BMET Recent Labs    03/17/22 1545 03/18/22 0538 03/19/22 0448  NA 138 141 138  K 2.9* 3.5 3.4*  CL 90* 99 100  CO2 32 33* 29  GLUCOSE 169* 124* 101*  BUN 45* 42* 38*  CREATININE 2.38* 2.22* 1.93*  CALCIUM 9.4 8.9 8.9   LFT Recent Labs    03/17/22 1545  PROT 6.1*  ALBUMIN 2.6*  AST 25  ALT 18  ALKPHOS 173*  BILITOT 0.5   PT/INR No results for input(s): "LABPROT", "INR" in the last 72 hours.  STUDIES: US Abdomen Limited RUQ (LIVER/GB)  Result Date: 03/17/2022 CLINICAL DATA:  History of prior cholecystectomy. Acute pancreatitis. EXAM: ULTRASOUND ABDOMEN LIMITED RIGHT UPPER QUADRANT COMPARISON:  None Available. FINDINGS: Gallbladder: Over gallbladder is surgically absent. Common bile duct: Diameter:  9.3 mm Liver: No focal lesion identified. Diffusely increased echogenicity of the liver parenchyma is noted. Portal vein is patent on color Doppler imaging with normal direction of blood flow towards the liver. Other: Of incidental note is the presence of a 3.7 cm right renal cyst. IMPRESSION: 1. Findings consistent with history of prior cholecystectomy. 2. Hepatic steatosis without focal liver lesions. 3. Right renal cyst which spine to the cystic lesion seen within the right kidney on the prior abdomen pelvis CT (March 17, 2022). Correlation with 6 week follow-up contrast-enhanced abdomen and pelvis CT is recommended for improved characterization and to determine stability. Electronically Signed   By: Virgina Norfolk M.D.   On: 03/17/2022 21:20   CT ABDOMEN PELVIS WO CONTRAST  Result Date: 03/17/2022 CLINICAL DATA:  Abdominal pain for 3 days. EXAM: CT ABDOMEN AND PELVIS WITHOUT CONTRAST TECHNIQUE: Multidetector CT imaging of the abdomen and pelvis was performed following the standard protocol without IV contrast. RADIATION DOSE REDUCTION: This exam was performed according to the departmental dose-optimization program which includes automated exposure control, adjustment of the mA and/or kV according to patient size and/or use of iterative reconstruction technique. COMPARISON:  CT, 09/09/2021 and 04/21/2021. FINDINGS: Lower chest: 4 mm nodule, right middle lobe, image 4, series 8. 5 mm nodule, right lower lobe, image 12/8. Tiny nodule, right lower lobe, image 19/8. These nodules are stable from 09/09/2021. Mild dependent lower lobe atelectasis. No acute findings. Hepatobiliary: Of several small low-attenuation liver lesions, largest at the dome of segment 7, 1 cm size, nonspecific, likely cysts. No other liver abnormality. Gallbladder surgically absent. No bile duct dilation. Pancreas: Unremarkable. No pancreatic ductal dilatation or surrounding inflammatory changes. Spleen: Normal in size without focal  abnormality. Adrenals/Urinary Tract: No adrenal masses. Mild bilateral renal cortical thinning. Isoattenuating right renal masses, 1 protruding from the posterolateral upper pole, 1.4 cm, the other protruding medially from the lower pole, 2 cm. Exophytic small cyst from the posterolateral midpole the left kidney, 1.3 cm. Right renal masses have both increased in size from the prior CT. Left renal cyst is without significant change. No new masses, no stones and no hydronephrosis. Normal ureters. Bladder mostly decompressed, otherwise unremarkable. Stomach/Bowel: Stomach mostly decompressed, otherwise unremarkable. Small bowel and colon are normal in caliber. No wall thickening. No inflammation. Scattered colonic diverticula most numerous on the left. Rectum moderately distended with stool. Vascular/Lymphatic: Dense aortic atherosclerotic calcifications extending to  the branch vessels. No aneurysm. No enlarged lymph nodes. Reproductive: Uterus and bilateral adnexa are unremarkable. Other: No ascites. Musculoskeletal: No fracture or acute finding. Well-positioned left total hip arthroplasty. Partly imaged posterior fusion hardware from a thoracic spine fusion. Thoracic spine hardware is new since the prior study. Left hip arthroplasty replaces compression screw and intramedullary rod seen previously. IMPRESSION: 1. No acute findings within the abdomen or pelvis. 2. 2 isoattenuating right renal masses have increased in size from previous CT. Although these may still reflect complicated/hemorrhagic cysts, recommend further assessment with nonemergent renal ultrasound. 3. Small lung base nodules as detailed stable from the CT dated 09/09/2021, and from exam from 2019, benign with no follow-up recommended. 4. Aortic atherosclerosis. 5. Rectum moderately distended with stool. Electronically Signed   By: Lajean Manes M.D.   On: 03/17/2022 18:16   UE VENOUS DUPLEX (7am - 7pm)  Result Date: 03/17/2022 UPPER VENOUS STUDY   Patient Name:  WESSIE SHANKS  Date of Exam:   03/17/2022 Medical Rec #: [id number]          Accession #:    [id number] Date of Birth: 11/25/1945           Patient Gender: F Patient Age:   75 years Exam Location:  [facility] Procedure:      VAS [place] UPPER EXTREMITY VENOUS DUPLEX Referring Phys: Herbie Baltimore LOCKWOOD --------------------------------------------------------------------------------  Indications: Pain, Edema, and numbness Limitations: Body habitus,PM placement, and poor ultrasound/tissue interface. Comparison Study: No previous UEV exams Performing Technologist: Jody Hill RVT, RDMS  Examination Guidelines: A complete evaluation includes B-mode imaging, spectral Doppler, color Doppler, and power Doppler as needed of all accessible portions of each vessel. Bilateral testing is considered an integral part of a complete examination. Limited examinations for reoccurring indications may be performed as noted.  Right Findings: +----------+------------+---------+-----------+----------+--------------------+ RIGHT     CompressiblePhasicitySpontaneousProperties      Summary        +----------+------------+---------+-----------+----------+--------------------+ IJV           Full       Yes       Yes                                   +----------+------------+---------+-----------+----------+--------------------+ Subclavian               Yes       Yes                   patent by                                                              color/doppler     +----------+------------+---------+-----------+----------+--------------------+ Axillary      Full       Yes       Yes                                   +----------+------------+---------+-----------+----------+--------------------+ Brachial      Full       Yes       Yes                                   +----------+------------+---------+-----------+----------+--------------------+  Radial        Full                                                        +----------+------------+---------+-----------+----------+--------------------+ Cephalic      Full                                    patent by color    +----------+------------+---------+-----------+----------+--------------------+ Basilic       Full       Yes       Yes                                   +----------+------------+---------+-----------+----------+--------------------+ Limited visualization of ulnar veins - patent by color only  Left Findings: +----------+------------+---------+-----------+----------+--------------------+ LEFT      CompressiblePhasicitySpontaneousProperties      Summary        +----------+------------+---------+-----------+----------+--------------------+ Subclavian               Yes       Yes                   patent by                                                              color/doppler     +----------+------------+---------+-----------+----------+--------------------+  Summary:  Right: No evidence of deep vein thrombosis in the upper extremity. No evidence of superficial vein thrombosis in the upper extremity.  Left: No evidence of thrombosis in the subclavian.  *See table(s) above for measurements and observations.  Diagnosing physician: Servando Snare MD Electronically signed by Servando Snare MD on 03/17/2022 at 5:37:48 PM.    Final      Vladimir Crofts  03/19/2022, 10:05 AM

## 2022-03-20 ENCOUNTER — Inpatient Hospital Stay (HOSPITAL_COMMUNITY): Payer: 59 | Admitting: Certified Registered Nurse Anesthetist

## 2022-03-20 ENCOUNTER — Encounter (HOSPITAL_COMMUNITY): Payer: Self-pay | Admitting: Family Medicine

## 2022-03-20 ENCOUNTER — Encounter (HOSPITAL_COMMUNITY)
Admission: EM | Disposition: A | Discharge status: Skilled Nursing Facility | Payer: Self-pay | Source: Home / Self Care | Attending: Internal Medicine

## 2022-03-20 DIAGNOSIS — I480 Paroxysmal atrial fibrillation: Secondary | ICD-10-CM | POA: Diagnosis not present

## 2022-03-20 DIAGNOSIS — D649 Anemia, unspecified: Secondary | ICD-10-CM | POA: Diagnosis not present

## 2022-03-20 DIAGNOSIS — I509 Heart failure, unspecified: Secondary | ICD-10-CM

## 2022-03-20 DIAGNOSIS — Z87891 Personal history of nicotine dependence: Secondary | ICD-10-CM

## 2022-03-20 DIAGNOSIS — K2970 Gastritis, unspecified, without bleeding: Secondary | ICD-10-CM

## 2022-03-20 DIAGNOSIS — I25119 Atherosclerotic heart disease of native coronary artery with unspecified angina pectoris: Secondary | ICD-10-CM

## 2022-03-20 DIAGNOSIS — K859 Acute pancreatitis without necrosis or infection, unspecified: Secondary | ICD-10-CM | POA: Diagnosis not present

## 2022-03-20 DIAGNOSIS — B3781 Candidal esophagitis: Secondary | ICD-10-CM | POA: Diagnosis present

## 2022-03-20 DIAGNOSIS — I110 Hypertensive heart disease with heart failure: Secondary | ICD-10-CM

## 2022-03-20 DIAGNOSIS — R110 Nausea: Secondary | ICD-10-CM

## 2022-03-20 HISTORY — PX: BIOPSY: SHX5522

## 2022-03-20 HISTORY — PX: ESOPHAGOGASTRODUODENOSCOPY (EGD) WITH PROPOFOL: SHX5813

## 2022-03-20 LAB — CBC
HCT: 29.5 % — ABNORMAL LOW (ref 36.0–46.0)
Hemoglobin: 9.4 g/dL — ABNORMAL LOW (ref 12.0–15.0)
MCH: 29 pg (ref 26.0–34.0)
MCHC: 31.9 g/dL (ref 30.0–36.0)
MCV: 91 fL (ref 80.0–100.0)
Platelets: 143 10*3/uL — ABNORMAL LOW (ref 150–400)
RBC: 3.24 MIL/uL — ABNORMAL LOW (ref 3.87–5.11)
RDW: 17.2 % — ABNORMAL HIGH (ref 11.5–15.5)
WBC: 6.6 10*3/uL (ref 4.0–10.5)
nRBC: 0 % (ref 0.0–0.2)

## 2022-03-20 LAB — TYPE AND SCREEN
ABO/RH(D): O POS
Antibody Screen: NEGATIVE
Unit division: 0

## 2022-03-20 LAB — BASIC METABOLIC PANEL
Anion gap: 7 (ref 5–15)
BUN: 31 mg/dL — ABNORMAL HIGH (ref 8–23)
CO2: 34 mmol/L — ABNORMAL HIGH (ref 22–32)
Calcium: 9 mg/dL (ref 8.9–10.3)
Chloride: 95 mmol/L — ABNORMAL LOW (ref 98–111)
Creatinine, Ser: 1.76 mg/dL — ABNORMAL HIGH (ref 0.44–1.00)
GFR, Estimated: 29 mL/min — ABNORMAL LOW (ref >60)
Glucose, Bld: 115 mg/dL — ABNORMAL HIGH (ref 70–99)
Potassium: 3.9 mmol/L (ref 3.5–5.1)
Sodium: 136 mmol/L (ref 135–145)

## 2022-03-20 LAB — GLUCOSE, CAPILLARY
Glucose-Capillary: 110 mg/dL — ABNORMAL HIGH (ref 70–99)
Glucose-Capillary: 118 mg/dL — ABNORMAL HIGH (ref 70–99)
Glucose-Capillary: 133 mg/dL — ABNORMAL HIGH (ref 70–99)
Glucose-Capillary: 138 mg/dL — ABNORMAL HIGH (ref 70–99)
Glucose-Capillary: 157 mg/dL — ABNORMAL HIGH (ref 70–99)
Glucose-Capillary: 167 mg/dL — ABNORMAL HIGH (ref 70–99)
Glucose-Capillary: 192 mg/dL — ABNORMAL HIGH (ref 70–99)

## 2022-03-20 LAB — BPAM RBC
Blood Product Expiration Date: 202403102359
ISSUE DATE / TIME: 202402071025
Unit Type and Rh: 5100

## 2022-03-20 LAB — LIPASE, BLOOD: Lipase: 314 U/L — ABNORMAL HIGH (ref 11–51)

## 2022-03-20 SURGERY — ESOPHAGOGASTRODUODENOSCOPY (EGD) WITH PROPOFOL
Anesthesia: Monitor Anesthesia Care

## 2022-03-20 MED ORDER — FUROSEMIDE 40 MG PO TABS
40.0000 mg | ORAL_TABLET | Freq: Two times a day (BID) | ORAL | Status: DC
  Administered 2022-03-20 – 2022-03-24 (×8): 40 mg via ORAL
  Filled 2022-03-20 (×8): qty 1

## 2022-03-20 MED ORDER — PROPOFOL 10 MG/ML IV BOLUS
INTRAVENOUS | Status: DC | PRN
  Administered 2022-03-20: 20 mg via INTRAVENOUS

## 2022-03-20 MED ORDER — FENTANYL CITRATE (PF) 100 MCG/2ML IJ SOLN: 25.0000 ug | INTRAMUSCULAR | Status: DC | PRN

## 2022-03-20 MED ORDER — OXYCODONE HCL 5 MG PO TABS: 5.0000 mg | ORAL_TABLET | Freq: Once | ORAL | Status: DC | PRN

## 2022-03-20 MED ORDER — ONDANSETRON HCL 4 MG/2ML IJ SOLN: 4.0000 mg | Freq: Four times a day (QID) | INTRAMUSCULAR | Status: DC | PRN

## 2022-03-20 MED ORDER — DIPHENHYDRAMINE HCL 50 MG/ML IJ SOLN
12.5000 mg | Freq: Once | INTRAMUSCULAR | Status: AC
  Administered 2022-03-20: 12.5 mg via INTRAVENOUS
  Filled 2022-03-20: qty 1

## 2022-03-20 MED ORDER — SODIUM CHLORIDE 0.9 % IV SOLN: INTRAVENOUS | Status: DC

## 2022-03-20 MED ORDER — OXYCODONE HCL 5 MG/5ML PO SOLN: 5.0000 mg | Freq: Once | ORAL | Status: DC | PRN

## 2022-03-20 MED ORDER — FLUCONAZOLE 200 MG PO TABS
200.0000 mg | ORAL_TABLET | Freq: Every day | ORAL | Status: DC
  Filled 2022-03-20: qty 1

## 2022-03-20 MED ORDER — FLUCONAZOLE 200 MG PO TABS
400.0000 mg | ORAL_TABLET | Freq: Once | ORAL | Status: AC
  Administered 2022-03-20: 400 mg via ORAL
  Filled 2022-03-20: qty 2

## 2022-03-20 MED ORDER — PROPOFOL 500 MG/50ML IV EMUL
INTRAVENOUS | Status: DC | PRN
  Administered 2022-03-20: 100 ug/kg/min via INTRAVENOUS

## 2022-03-20 MED ORDER — SUCRALFATE 1 GM/10ML PO SUSP: 1.0000 g | Freq: Three times a day (TID) | ORAL | Status: DC

## 2022-03-20 MED ORDER — FLUCONAZOLE 200 MG PO TABS
200.0000 mg | ORAL_TABLET | Freq: Every day | ORAL | Status: DC
  Administered 2022-03-21 – 2022-03-24 (×4): 200 mg via ORAL
  Filled 2022-03-20 (×4): qty 1

## 2022-03-20 MED ORDER — PANTOPRAZOLE SODIUM 40 MG PO TBEC
40.0000 mg | DELAYED_RELEASE_TABLET | Freq: Two times a day (BID) | ORAL | Status: DC
  Administered 2022-03-20 – 2022-03-24 (×9): 40 mg via ORAL
  Filled 2022-03-20 (×10): qty 1

## 2022-03-20 MED ORDER — SUCRALFATE 1 GM/10ML PO SUSP
1.0000 g | Freq: Three times a day (TID) | ORAL | Status: DC
  Administered 2022-03-20 – 2022-03-24 (×15): 1 g via ORAL
  Filled 2022-03-20 (×15): qty 10

## 2022-03-20 SURGICAL SUPPLY — 15 items

## 2022-03-20 NOTE — Anesthesia Procedure Notes (Signed)
Procedure Name: MAC Date/Time: 03/20/2022 9:35 AM  Performed by: Harden Mo, CRNAPre-anesthesia Checklist: Patient identified, Emergency Drugs available, Suction available and Patient being monitored Patient Re-evaluated:Patient Re-evaluated prior to induction Oxygen Delivery Method: Nasal cannula Preoxygenation: Pre-oxygenation with 100% oxygen Induction Type: IV induction Placement Confirmation: positive ETCO2 and breath sounds checked- equal and bilateral Dental Injury: Teeth and Oropharynx as per pre-operative assessment

## 2022-03-20 NOTE — Progress Notes (Signed)
Patient is back on the floor via bed after the procedure at 1030AM. Alert and oriented

## 2022-03-20 NOTE — Transfer of Care (Signed)
Immediate Anesthesia Transfer of Care Note  Patient: MATAYA KILDUFF  Procedure(s) Performed: ESOPHAGOGASTRODUODENOSCOPY (EGD) WITH PROPOFOL BIOPSY  Patient Location: PACU  Anesthesia Type:MAC  Level of Consciousness: awake and drowsy  Airway & Oxygen Therapy: Patient Spontanous Breathing and Patient connected to nasal cannula oxygen  Post-op Assessment: Report given to RN and Post -op Vital signs reviewed and stable  Post vital signs: Reviewed and stable  Last Vitals:  Vitals Value Taken Time  BP 130/70 03/20/22 0954  Temp    Pulse 85 03/20/22 0955  Resp 26 03/20/22 0955  SpO2 100 % 03/20/22 0955  Vitals shown include unvalidated device data.  Last Pain:  Vitals:   03/20/22 0841  TempSrc: Temporal  PainSc: 0-No pain         Complications: No notable events documented.

## 2022-03-20 NOTE — Op Note (Signed)
[facility] Patient Name: [name] Procedure Date : 03/20/2022 MRN: [id number] Attending MD: Gerrit Heck , MD, [id number] Date of Birth: 02-07-1946 CSN: [id number] Age: 77 Admit Type: Inpatient Procedure:                Upper GI endoscopy w/ biopsy Indications:              Acute on chronic abdominal pain, Decreased                            appetite, Heme positive stool, Nausea Providers:                Gerrit Heck, MD, Doristine Johns, RN, Frazier Richards, Technician Referring MD:              Medicines:                Monitored Anesthesia Care Complications:            No immediate complications. Estimated Blood Loss:     Estimated blood loss was minimal. Procedure:                Pre-Anesthesia Assessment:                           - Prior to the procedure, a History and Physical                            was performed, and patient medications and                            allergies were reviewed. The patient's tolerance of                            previous anesthesia was also reviewed. The risks                            and benefits of the procedure and the sedation                            options and risks were discussed with the patient.                            All questions were answered, and informed consent                            was obtained. Prior Anticoagulants: The patient has                            taken Plavix (clopidogrel), last dose was 4 days                            prior to procedure. ASA Grade Assessment: IV - A  patient with severe systemic disease that is a                            constant threat to life. After reviewing the risks                            and benefits, the patient was deemed in                            satisfactory condition to undergo the procedure.                           After obtaining informed consent, the endoscope was                             passed under direct vision. Throughout the                            procedure, the patient's blood pressure, pulse, and                            oxygen saturations were monitored continuously. The                            [id number]-H190 ([id number]) Olympus endoscope was introduced                            through the mouth, and advanced to the third part                            of duodenum. The upper GI endoscopy was                            accomplished without difficulty. The patient                            tolerated the procedure well. Scope In: Scope Out: Findings:      Multiple, small, white plaques were found in the upper third of the       esophagus and in the middle third of the esophagus. Biopsies were taken       with a cold forceps for histology. Estimated blood loss was minimal.      The Z-line was regular and was found 40 cm from the incisors.      The gastroesophageal flap valve was visualized endoscopically and       classified as Hill Grade III (minimal fold, loose to endoscope, hiatal       hernia likely).      Diffuse moderate inflammation characterized by congestion (edema) and       erythema was found in the entire examined stomach. Biopsies were taken       with a cold forceps for histology. Estimated blood loss was minimal.      The examined duodenum was normal. Impression:               - Esophageal plaques were found, suspicious  for                            candidiasis. Biopsied.                           - Z-line regular, 40 cm from the incisors.                           - Gastroesophageal flap valve classified as Hill                            Grade III (minimal fold, loose to endoscope, hiatal                            hernia likely).                           - Diffuse, non-ulcer gastritis. Biopsied.                           - Normal examined duodenum. Recommendation:           - Return patient to hospital ward for ongoing  care.                           - Advance diet as tolerated.                           - Continue present medications.                           - Post-Procedure Resumption of Antiplatelet                            Medications: Restart Plavix (clopidogrel) tomorrow                            75 mg PO daily.                           - Await pathology results.                           - Use Protonix (pantoprazole) 40 mg PO BID.                           - Use sucralfate suspension 1 gram PO BID for 4                            weeks.                           - Diflucan (fluconazole) 400 mg x1 then 200 mg PO                            daily for 3 weeks.                           -  Results discussed with her son by phone, per                            patient request.                           - Inpatient GI service will sign off at this time.                            Please do not hesitate to contact the inpatient                            service with additional questions or concerns. Procedure Code(s):        --- Professional ---                           [phone], Esophagogastroduodenoscopy, flexible,                            transoral; with biopsy, single or multiple Diagnosis Code(s):        --- Professional ---                           K22.9, Disease of esophagus, unspecified                           K29.70, Gastritis, unspecified, without bleeding                           R10.13, Epigastric pain                           R19.5, Other fecal abnormalities                           R11.0, Nausea CPT copyright 2022 [employer]. All rights reserved. The codes documented in this report are preliminary and upon coder review may  be revised to meet current compliance requirements. Gerrit Heck, MD 03/20/2022 9:57:29 AM Number of Addenda: 0

## 2022-03-20 NOTE — Interval H&P Note (Signed)
History and Physical Interval Note:  03/20/2022 8:56 AM  [name]  has presented today for surgery, with the diagnosis of epigastric pain, anemia, FOBT+.  The various methods of treatment have been discussed with the patient and family. After consideration of risks, benefits and other options for treatment, the patient has consented to  Procedure(s): ESOPHAGOGASTRODUODENOSCOPY (EGD) WITH PROPOFOL (N/A) as a surgical intervention.  The patient's history has been reviewed, patient examined, no change in status, stable for surgery.  I have reviewed the patient's chart and labs.  Questions were answered to the patient's satisfaction.     Dominic Pea [name]

## 2022-03-20 NOTE — Anesthesia Preprocedure Evaluation (Signed)
Anesthesia Evaluation  Patient identified by MRN, date of birth, ID band Patient awake    Reviewed: Allergy & Precautions, H&P , NPO status , Patient's Chart, lab work & pertinent test results  Airway Mallampati: II   Neck ROM: full    Dental   Pulmonary asthma , sleep apnea , former smoker   breath sounds clear to auscultation       Cardiovascular hypertension, + angina  + CAD, + CABG and +CHF  + dysrhythmias Atrial Fibrillation + pacemaker + Cardiac Defibrillator  Rhythm:irregular Rate:Normal  TTE (07/2021): EF 45-50%   Neuro/Psych CVA    GI/Hepatic hiatal hernia,GERD  ,,  Endo/Other  diabetes, Type 2    Renal/GU      Musculoskeletal  (+) Arthritis ,    Abdominal   Peds  Hematology   Anesthesia Other Findings   Reproductive/Obstetrics                             Anesthesia Physical Anesthesia Plan  ASA: 4  Anesthesia Plan: MAC   Post-op Pain Management:    Induction: Intravenous  PONV Risk Score and Plan: 2 and Propofol infusion and Treatment may vary due to age or medical condition  Airway Management Planned: Nasal Cannula  Additional Equipment:   Intra-op Plan:   Post-operative Plan:   Informed Consent: I have reviewed the patients History and Physical, chart, labs and discussed the procedure including the risks, benefits and alternatives for the proposed anesthesia with the patient or authorized representative who has indicated his/her understanding and acceptance.     Dental advisory given  Plan Discussed with: CRNA, Anesthesiologist and Surgeon  Anesthesia Plan Comments:        Anesthesia Quick Evaluation

## 2022-03-20 NOTE — Progress Notes (Signed)
PROGRESS NOTE  DONISHA HOCH DPO:[id number] DOB: Apr 06, 1945 DOA: 03/17/2022 PCP: Lujean Amel, MD  HPI/brief narrative  [name] is a 77 y.o. female with medical history significant for asthma, atrial fibrillation, coronary artery disease, status post PCI and stent, GERD, gout, hypertension, dyslipidemia and OSA as well as CVA and type 2 diabetes mellitus, who presented to the ER with generalized abdominal pain which have been worsening over the last 3 weeks with associated anorexia without nausea or vomiting. In the ED, VSS, labs revealed hypokalemia of 2.9 and hypochloremia of 90. Lipase level came back 1508 and total protein 6.1.  CBC showed anemia with hemoglobin 7.9 hematocrit 26.7 close to baseline.  CT abdomen/pelvis with no acute findings, pancreas unremarkable, rectum moderately distended with stool.  Right upper quadrant with hepatic steatosis without focal liver lesions, noted right renal cyst, follow-up with repeat CAT scan in 6-week as recommended.  Patient admitted for further management.   Subjective  No significant events overnight, she denies any nausea or abdominal pain today.   Assessment/Plan: Principal Problem:   Acute pancreatitis Active Problems:   Hypokalemia   Atrial fibrillation (HCC)   Dyslipidemia   GERD without esophagitis   Coronary artery disease   Heme positive stool   Acute on chronic anemia   Abdominal pain, chronic, epigastric   Generalized abdominal pain Acute pancreatitis Reports generalized abdominal pain and nausea,  with no CT findings of pancreatitis -Lipase level significantly elevated on admission, trending down, no evidence of bile duct dilation, no history of alcohol use. -Tolerating clear liquid diet.  Hx of GERD without esophagitis Hemoccult positive stool Noted drop in hgb (baseline around 8-9), dropped to 7 Anemia panel showed iron 38, TIBC 213, ferritin 738, sats 18 FOBT positive She received 1 unit PRBC with good  response -Plan for endoscopy today  CKD stage IV Anemia of chronic kidney disease Baseline creatinine around 2.5 Daily BMP   Chronic hypoxic respiratory failure COPD On 3L of O2 chronically  Chronic diastolic and systolic HF Coronary artery disease Stopped IVF given hx of fluid overload with CHF Continue Imdur Hold plavix and aspirin for now due to Hemoccult positive stool PRN sublingual nitroglycerin -Resume Lasix this evening.   Dyslipidemia TG WNL Continue statin therapy  Complete heart block Status post pacemaker  Constipation CT a/p showed moderate constipation Started bowel regimen, refuses linzess  OSA CPAP  Obesity Lifestyle modifications advised  Wheelchair bound     Pressure Injury 03/17/22 Sacrum Mid Stage 2 -  Partial thickness loss of dermis presenting as a shallow open injury with a red, pink wound bed without slough. small open section, can see previous healed wound (Active)  03/17/22 2315  Location: Sacrum  Location Orientation: Mid  Staging: Stage 2 -  Partial thickness loss of dermis presenting as a shallow open injury with a red, pink wound bed without slough.  Wound Description (Comments): small open section, can see previous healed wound  Present on Admission:   Dressing Type Foam - Lift dressing to assess site every shift 03/19/22 2000         Estimated body mass index is 33.19 kg/m as calculated from the following:   Height as of this encounter: '5\' 3"'$  (1.6 m).   Weight as of this encounter: 85 kg.      Code Status: Full  Family Communication: None at bedside  Disposition Plan: Status is: Inpatient Remains inpatient appropriate because: Level of care      Consultants: GI  Procedures: None  Antimicrobials: None  DVT prophylaxis: SCDs lovenox   Objective: Vitals:   03/20/22 0635 03/20/22 0746 03/20/22 0809 03/20/22 0841  BP: (!) 164/88 (!) 141/62  (!) 172/86  Pulse: 80 85  88  Resp: 18 16  (!) 21  Temp:  98.8 F (37.1 C) 98 F (36.7 C)  (!) 97.4 F (36.3 C)  TempSrc: Oral   Temporal  SpO2: 99% 100% 100% 97%  Weight:      Height:        Intake/Output Summary (Last 24 hours) at 03/20/2022 0910 Last data filed at 03/20/2022 0932 Gross per 24 hour  Intake 386 ml  Output 1751 ml  Net -1365 ml   Filed Weights   03/17/22 1612  Weight: 85 kg    Exam:  Awake Alert, Oriented X 3, No new F.N deficits, Normal affect Symmetrical Chest wall movement, Good air movement bilaterally, CTAB RRR,No Gallops,Rubs or new Murmurs, No Parasternal Heave +ve B.Sounds, Abd Soft, No tenderness, No rebound - guarding or rigidity. No Cyanosis, Clubbing or edema, No new Rash or bruise       Data Reviewed: CBC: Recent Labs  Lab 03/17/22 1545 03/18/22 0538 03/19/22 0448 03/19/22 1903 03/20/22 0349  WBC 8.0 5.7 4.9 7.4 6.6  NEUTROABS 5.7  --  2.8  --   --   HGB 7.9* 7.0* 7.4* 9.7* 9.4*  HCT 26.7* 24.4* 24.3* 30.5* 29.5*  MCV 97.4 97.6 96.0 91.6 91.0  PLT 180 148* 134* 138* 355*   Basic Metabolic Panel: Recent Labs  Lab 03/13/22 1116 03/17/22 1545 03/18/22 0538 03/19/22 0448 03/20/22 0349  NA 136 138 141 138 136  K 2.9* 2.9* 3.5 3.4* 3.9  CL 94* 90* 99 100 95*  CO2 35* 32 33* 29 34*  GLUCOSE 172* 169* 124* 101* 115*  BUN 40* 45* 42* 38* 31*  CREATININE 2.04* 2.38* 2.22* 1.93* 1.76*  CALCIUM 9.1  9.3 9.4 8.9 8.9 9.0  PHOS 2.9  --   --   --   --    GFR: Estimated Creatinine Clearance: 27.6 mL/min (A) (by C-G formula based on SCr of 1.76 mg/dL (H)). Liver Function Tests: Recent Labs  Lab 03/13/22 1116 03/17/22 1545  AST  --  25  ALT  --  18  ALKPHOS  --  173*  BILITOT  --  0.5  PROT  --  6.1*  ALBUMIN 2.6* 2.6*   Recent Labs  Lab 03/17/22 1545 03/18/22 0538 03/19/22 0448 03/20/22 0349  LIPASE 1,308* 529* 382* 314*   No results for input(s): "AMMONIA" in the last 168 hours. Coagulation Profile: No results for input(s): "INR", "PROTIME" in the last 168 hours. Cardiac  Enzymes: No results for input(s): "CKTOTAL", "CKMB", "CKMBINDEX", "TROPONINI" in the last 168 hours. BNP (last 3 results) No results for input(s): "PROBNP" in the last 8760 hours. HbA1C: No results for input(s): "HGBA1C" in the last 72 hours. CBG: Recent Labs  Lab 03/19/22 1205 03/19/22 1601 03/19/22 2343 03/20/22 0620 03/20/22 0858  GLUCAP 122* 139* 133* 118* 110*   Lipid Profile: Recent Labs    03/18/22 0538  CHOL 124  HDL 41  LDLCALC 64  TRIG 94  CHOLHDL 3.0   Thyroid Function Tests: No results for input(s): "TSH", "T4TOTAL", "FREET4", "T3FREE", "THYROIDAB" in the last 72 hours. Anemia Panel: No results for input(s): "VITAMINB12", "FOLATE", "FERRITIN", "TIBC", "IRON", "RETICCTPCT" in the last 72 hours. Urine analysis:    Component Value Date/Time   COLORURINE YELLOW 03/17/2022 1530  APPEARANCEUR CLEAR 03/17/2022 1530   LABSPEC 1.016 03/17/2022 1530   PHURINE 5.0 03/17/2022 1530   GLUCOSEU NEGATIVE 03/17/2022 1530   HGBUR NEGATIVE 03/17/2022 1530   BILIRUBINUR NEGATIVE 03/17/2022 1530   KETONESUR NEGATIVE 03/17/2022 1530   PROTEINUR 30 (A) 03/17/2022 1530   UROBILINOGEN 0.2 12/06/2014 1531   NITRITE NEGATIVE 03/17/2022 1530   LEUKOCYTESUR TRACE (A) 03/17/2022 1530   Sepsis Labs: '@LABRCNTIP'$ (procalcitonin:4,lacticidven:4)  )No results found for this or any previous visit (from the past 240 hour(s)).    Studies: No results found.  Scheduled Meds:  [MAR Hold] cycloSPORINE  1 drop Both Eyes BID   [MAR Hold] ferrous sulfate  325 mg Oral Q breakfast   [MAR Hold] folic acid  1 mg Oral Daily   [MAR Hold] furosemide  40 mg Oral BID   [MAR Hold] insulin aspart  0-9 Units Subcutaneous Q6H   [MAR Hold] mometasone-formoterol  2 puff Inhalation BID   [MAR Hold] pantoprazole (PROTONIX) IV  40 mg Intravenous Q12H   [MAR Hold] polyethylene glycol  17 g Oral BID   [MAR Hold] senna-docusate  1 tablet Oral BID    Continuous Infusions:  sodium chloride 20 mL/hr at  03/20/22 0854      LOS: 3 days     Phillips Climes, MD [facility]  If 7PM-7AM, please contact night-coverage [email or web address] 03/20/2022, 9:10 AM

## 2022-03-21 DIAGNOSIS — B3781 Candidal esophagitis: Secondary | ICD-10-CM

## 2022-03-21 DIAGNOSIS — D649 Anemia, unspecified: Secondary | ICD-10-CM | POA: Diagnosis not present

## 2022-03-21 LAB — CBC
HCT: 31.6 % — ABNORMAL LOW (ref 36.0–46.0)
Hemoglobin: 9.7 g/dL — ABNORMAL LOW (ref 12.0–15.0)
MCH: 28.5 pg (ref 26.0–34.0)
MCHC: 30.7 g/dL (ref 30.0–36.0)
MCV: 92.9 fL (ref 80.0–100.0)
Platelets: 146 10*3/uL — ABNORMAL LOW (ref 150–400)
RBC: 3.4 MIL/uL — ABNORMAL LOW (ref 3.87–5.11)
RDW: 16.9 % — ABNORMAL HIGH (ref 11.5–15.5)
WBC: 7.2 10*3/uL (ref 4.0–10.5)
nRBC: 0 % (ref 0.0–0.2)

## 2022-03-21 LAB — GLUCOSE, CAPILLARY
Glucose-Capillary: 115 mg/dL — ABNORMAL HIGH (ref 70–99)
Glucose-Capillary: 122 mg/dL — ABNORMAL HIGH (ref 70–99)
Glucose-Capillary: 153 mg/dL — ABNORMAL HIGH (ref 70–99)
Glucose-Capillary: 149 mg/dL — ABNORMAL HIGH (ref 70–99)
Glucose-Capillary: 160 mg/dL — ABNORMAL HIGH (ref 70–99)

## 2022-03-21 LAB — BASIC METABOLIC PANEL
Anion gap: 11 (ref 5–15)
BUN: 28 mg/dL — ABNORMAL HIGH (ref 8–23)
CO2: 30 mmol/L (ref 22–32)
Calcium: 8.9 mg/dL (ref 8.9–10.3)
Chloride: 95 mmol/L — ABNORMAL LOW (ref 98–111)
Creatinine, Ser: 1.73 mg/dL — ABNORMAL HIGH (ref 0.44–1.00)
GFR, Estimated: 30 mL/min — ABNORMAL LOW (ref >60)
Glucose, Bld: 129 mg/dL — ABNORMAL HIGH (ref 70–99)
Potassium: 3.9 mmol/L (ref 3.5–5.1)
Sodium: 136 mmol/L (ref 135–145)

## 2022-03-21 LAB — SURGICAL PATHOLOGY

## 2022-03-21 LAB — LIPASE, BLOOD: Lipase: 323 U/L — ABNORMAL HIGH (ref 11–51)

## 2022-03-21 MED ORDER — ENOXAPARIN SODIUM 40 MG/0.4ML IJ SOSY: 40.0000 mg | PREFILLED_SYRINGE | INTRAMUSCULAR | Status: DC

## 2022-03-21 MED ORDER — CLOPIDOGREL BISULFATE 75 MG PO TABS
75.0000 mg | ORAL_TABLET | Freq: Every day | ORAL | Status: DC
  Administered 2022-03-21 – 2022-03-24 (×4): 75 mg via ORAL
  Filled 2022-03-21 (×4): qty 1

## 2022-03-21 MED ORDER — DIPHENHYDRAMINE HCL 25 MG PO CAPS
25.0000 mg | ORAL_CAPSULE | Freq: Three times a day (TID) | ORAL | Status: DC | PRN
  Filled 2022-03-21: qty 1

## 2022-03-21 MED ORDER — ENOXAPARIN SODIUM 30 MG/0.3ML IJ SOSY
30.0000 mg | PREFILLED_SYRINGE | INTRAMUSCULAR | Status: DC
  Administered 2022-03-21 – 2022-03-23 (×3): 30 mg via SUBCUTANEOUS
  Filled 2022-03-21 (×3): qty 0.3

## 2022-03-21 NOTE — Anesthesia Postprocedure Evaluation (Signed)
Anesthesia Post Note  Patient: [name]  Procedure([name]) Performed: ESOPHAGOGASTRODUODENOSCOPY (EGD) WITH PROPOFOL BIOPSY     Patient location during evaluation: Endoscopy Anesthesia Type: MAC Level of consciousness: awake and alert Pain management: pain level controlled Vital Signs Assessment: post-procedure vital signs reviewed and stable Respiratory status: spontaneous breathing, nonlabored ventilation, respiratory function stable and patient connected to nasal cannula oxygen Cardiovascular status: stable and blood pressure returned to baseline Postop Assessment: no apparent nausea or vomiting Anesthetic complications: no   No notable events documented.  Last Vitals:  Vitals:   03/21/22 0223 03/21/22 0800  BP: (!) 125/54 (!) 145/80  Pulse: 79 85  Resp: 18 18  Temp: 36.7 C 36.6 C  SpO2: 100% 100%    Last Pain:  Vitals:   03/21/22 1000  TempSrc:   PainSc: 0-No pain                 [name]

## 2022-03-21 NOTE — Progress Notes (Signed)
PROGRESS NOTE  [name] [id number] DOB: [birth year] DOA: 03/17/2022 PCP: Lujean Amel, MD  HPI/brief narrative  [name] is a 77 y.o. female with medical history significant for asthma, atrial fibrillation, coronary artery disease, status post PCI and stent, GERD, gout, hypertension, dyslipidemia and OSA as well as CVA and type 2 diabetes mellitus, who presented to the ER with generalized abdominal pain which have been worsening over the last 3 weeks with associated anorexia without nausea or vomiting. In the ED, VSS, labs revealed hypokalemia of 2.9 and hypochloremia of 90. Lipase level came back 1508 and total protein 6.1.  CBC showed anemia with hemoglobin 7.9 hematocrit 26.7 close to baseline.  CT abdomen/pelvis with no acute findings, pancreas unremarkable, rectum moderately distended with stool.  Right upper quadrant with hepatic steatosis without focal liver lesions, noted right renal cyst, follow-up with repeat CAT scan in 6-week as recommended.  Patient admitted for further management.   Subjective  No significant events overnight, she denies any complaints today, no nausea, no vomiting, no abdominal pain.   Assessment/Plan: Principal Problem:   Acute pancreatitis Active Problems:   Hypokalemia   Atrial fibrillation (HCC)   Dyslipidemia   GERD without esophagitis   Coronary artery disease   Heme positive stool   Acute on chronic anemia   Abdominal pain, chronic, epigastric   Candida esophagitis (HCC)   Gastritis and gastroduodenitis   Nausea without vomiting   Generalized abdominal pain due to candidal esophagitis Acute pancreatitis ruled out Reports generalized abdominal pain and nausea,  with no CT findings of pancreatitis -Lipase level significantly elevated on admission, trending down, discussed with GI elevated lipase most likely in the setting of esophagitis, no evidence of bile duct dilation, no history of alcohol use. -Waiting for liquid  diet, advance to soft diet today  Juliane esophagitis Hemoccult positive stool Noted drop in hgb (baseline around 8-9), dropped to 7 Anemia panel showed iron 38, TIBC 213, ferritin 738, sats 18 FOBT positive She received 1 unit PRBC with good response Went for endoscopy, significant for esophageal plaques suspicious for candidiasis, which was biopsied, recommendation to continue with Protonix 40 mg p.o. twice daily, sacral fate suspension x 4 weeks, and fluconazole x 21 days  CKD stage IV Anemia of chronic kidney disease Baseline creatinine around 2.5 Daily BMP   Chronic hypoxic respiratory failure COPD On 3L of O2 chronically  Chronic diastolic and systolic HF Coronary artery disease Stopped IVF given hx of fluid overload with CHF Continue Imdur Holding Plavix and aspirin initially, will resume back on Plavix PRN sublingual nitroglycerin -Back on Lasix   Dyslipidemia TG WNL Continue statin therapy  Complete heart block Status post pacemaker  Constipation CT a/p showed moderate constipation Started bowel regimen, refuses linzess  OSA CPAP  Obesity Lifestyle modifications advised  Wheelchair bound     Pressure Injury 03/17/22 Sacrum Mid Stage 2 -  Partial thickness loss of dermis presenting as a shallow open injury with a red, pink wound bed without slough. small open section, can see previous healed wound (Active)  03/17/22 2315  Location: Sacrum  Location Orientation: Mid  Staging: Stage 2 -  Partial thickness loss of dermis presenting as a shallow open injury with a red, pink wound bed without slough.  Wound Description (Comments): small open section, can see previous healed wound  Present on Admission:   Dressing Type Foam - Lift dressing to assess site every shift 03/21/22 0735  Estimated body mass index is 33.19 kg/m as calculated from the following:   Height as of this encounter: 5' 3"$  (1.6 m).   Weight as of this encounter: 85 kg.       Code Status: Full  Family Communication: None at bedside  Disposition Plan: Status is: Inpatient And is cleared to go back to SNF, awaiting insurance authorization      Consultants: GI  Procedures: 2/8  Antimicrobials: None  DVT prophylaxis: SCDs lovenox   Objective: Vitals:   03/20/22 1945 03/20/22 1946 03/21/22 0223 03/21/22 0800  BP:  (!) 142/71 (!) 125/54 (!) 145/80  Pulse:  80 79 85  Resp:  18 18 18  $ Temp:  98.1 F (36.7 C) 98 F (36.7 C) 97.9 F (36.6 C)  TempSrc:   Oral Oral  SpO2: 95% 99% 100% 100%  Weight:      Height:        Intake/Output Summary (Last 24 hours) at 03/21/2022 1403 Last data filed at 03/21/2022 0856 Gross per 24 hour  Intake --  Output 750 ml  Net -750 ml   Filed Weights   03/17/22 1612  Weight: 85 kg    Exam:  Awake Alert, Oriented X 3, No new F.N deficits, Normal affect Symmetrical Chest wall movement, Good air movement bilaterally, CTAB RRR,No Gallops,Rubs or new Murmurs, No Parasternal Heave +ve B.Sounds, Abd Soft, No tenderness, No rebound - guarding or rigidity. No Cyanosis, Clubbing or edema, No new Rash or bruise        Data Reviewed: CBC: Recent Labs  Lab 03/17/22 1545 03/18/22 0538 03/19/22 0448 03/19/22 1903 03/20/22 0349 03/21/22 0350  WBC 8.0 5.7 4.9 7.4 6.6 7.2  NEUTROABS 5.7  --  2.8  --   --   --   HGB 7.9* 7.0* 7.4* 9.7* 9.4* 9.7*  HCT 26.7* 24.4* 24.3* 30.5* 29.5* 31.6*  MCV 97.4 97.6 96.0 91.6 91.0 92.9  PLT 180 148* 134* 138* 143* [id number]*   Basic Metabolic Panel: Recent Labs  Lab 03/17/22 1545 03/18/22 0538 03/19/22 0448 03/20/22 0349 03/21/22 0350  NA 138 141 138 136 136  K 2.9* 3.5 3.4* 3.9 3.9  CL 90* 99 100 95* 95*  CO2 32 33* 29 34* 30  GLUCOSE 169* 124* 101* 115* 129*  BUN 45* 42* 38* 31* 28*  CREATININE 2.38* 2.22* 1.93* 1.76* 1.73*  CALCIUM 9.4 8.9 8.9 9.0 8.9   GFR: Estimated Creatinine Clearance: 28.1 mL/min (A) (by C-G formula based on SCr of 1.73 mg/dL  (H)). Liver Function Tests: Recent Labs  Lab 03/17/22 1545  AST 25  ALT 18  ALKPHOS 173*  BILITOT 0.5  PROT 6.1*  ALBUMIN 2.6*   Recent Labs  Lab 03/17/22 1545 03/18/22 0538 03/19/22 0448 03/20/22 0349 03/21/22 0350  LIPASE 1,308* 529* 382* 314* 323*   No results for input(s): "AMMONIA" in the last 168 hours. Coagulation Profile: No results for input(s): "INR", "PROTIME" in the last 168 hours. Cardiac Enzymes: No results for input(s): "CKTOTAL", "CKMB", "CKMBINDEX", "TROPONINI" in the last 168 hours. BNP (last 3 results) No results for input(s): "PROBNP" in the last 8760 hours. HbA1C: No results for input(s): "HGBA1C" in the last 72 hours. CBG: Recent Labs  Lab 03/20/22 2116 03/20/22 2345 03/21/22 0628 03/21/22 0758 03/21/22 1155  GLUCAP 167* 157* 115* 122* 153*   Lipid Profile: No results for input(s): "CHOL", "HDL", "LDLCALC", "TRIG", "CHOLHDL", "LDLDIRECT" in the last 72 hours.  Thyroid Function Tests: No results for input(s): "TSH", "T4TOTAL", "FREET4", "T3FREE", "  THYROIDAB" in the last 72 hours. Anemia Panel: No results for input(s): "VITAMINB12", "FOLATE", "FERRITIN", "TIBC", "IRON", "RETICCTPCT" in the last 72 hours. Urine analysis:    Component Value Date/Time   COLORURINE YELLOW 03/17/2022 1530   APPEARANCEUR CLEAR 03/17/2022 1530   LABSPEC 1.016 03/17/2022 1530   PHURINE 5.0 03/17/2022 1530   GLUCOSEU NEGATIVE 03/17/2022 1530   HGBUR NEGATIVE 03/17/2022 1530   BILIRUBINUR NEGATIVE 03/17/2022 1530   KETONESUR NEGATIVE 03/17/2022 1530   PROTEINUR 30 (A) 03/17/2022 1530   UROBILINOGEN 0.2 12/06/2014 1531   NITRITE NEGATIVE 03/17/2022 1530   LEUKOCYTESUR TRACE (A) 03/17/2022 1530   Sepsis Labs: @LABRCNTIP$ (procalcitonin:4,lacticidven:4)  )No results found for this or any previous visit (from the past 240 hour(s)).    Studies: No results found.  Scheduled Meds:  cycloSPORINE  1 drop Both Eyes BID   ferrous sulfate  325 mg Oral Q breakfast    fluconazole  200 mg Oral Daily   folic acid  1 mg Oral Daily   furosemide  40 mg Oral BID   insulin aspart  0-9 Units Subcutaneous Q6H   mometasone-formoterol  2 puff Inhalation BID   pantoprazole  40 mg Oral BID   polyethylene glycol  17 g Oral BID   senna-docusate  1 tablet Oral BID   sucralfate  1 g Oral TID WC & HS    Continuous Infusions:      LOS: 4 days     Phillips Climes, MD [facility]  If 7PM-7AM, please contact night-coverage [email or web address] 03/21/2022, 2:03 PM

## 2022-03-21 NOTE — Progress Notes (Signed)
CSW initiated insurance authorization for patient to return [place].  Madilyn Fireman, MSW, LCSW Transitions of Care  Clinical Social Worker II [phone]

## 2022-03-21 NOTE — Progress Notes (Signed)
Mobility Specialist - Progress Note   03/21/22 1001  Mobility  Activity Transferred from bed to chair  Level of Assistance Minimal assist, patient does 75% or more  Assistive Device Front wheel walker  Activity Response Tolerated well  Mobility Referral Yes  $Mobility charge 1 Mobility   Pt was received in bed and agreeable to mobility. Pt c/o back pain and being hungry throughout session. Pt left in chair with all needs met and chair alarm on.   Franki Monte  Mobility Specialist Please contact via Solicitor or Rehab office at [phone]

## 2022-03-21 NOTE — Care Management Important Message (Signed)
Important Message  Patient Details  Name: [name] MRN: [id number] Date of Birth: 15-Sep-1945   Medicare Important Message Given:  Yes     Orbie Pyo 03/21/2022, 1:49 PM

## 2022-03-21 NOTE — TOC Initial Note (Signed)
Transition of Care [facility]) - Initial/Assessment Note    Patient Details  Name: [name] MRN: [id number] Date of Birth: 04/27/1945  Transition of Care [facility]) CM/SW Contact:    Curlene Labrum, RN Phone Number: 03/21/2022, 2:27 PM  Clinical Narrative:                 CM met with the patient at the bedside and patient states that she was admitted to [facility] for short term rehabilitation post hip surgery.  The patient was agreeable to return to the facility.  I called and spoke with Perrin Smack, CM at [facility] and insurance authorization would need to be restarted through [facility].  Shon Baton, MSW restarted insurance authorization.  FL2 was completed the the attending MD was updated that once the insurance authorization was approved that the patient would return to the facility - [place] tomorrow.  The patient will need PTAR transport and include oxygen at 3L./min [place] since patient is on chronic oxygen.  Expected Discharge Plan: Skilled Nursing Facility Barriers to Discharge: Insurance Authorization   Patient Goals and CMS Choice Patient states their goals for this hospitalization and ongoing recovery are:: To return to [facility] for short term rehab CMS Medicare.gov Compare Post Acute Care list provided to:: Patient Choice offered to / list presented to : Patient [place] ownership interest in [facility].provided to:: Patient    Expected Discharge Plan and Services   Discharge Planning Services: CM Consult Post Acute Care Choice: [place] Living arrangements for the past 2 months: Apartment                                      Prior Living Arrangements/Services Living arrangements for the past 2 months: Apartment Lives with:: Adult Children (Patient lives with her son at the home) Patient language and need for interpreter reviewed:: Yes Do you feel safe going back to the place where you live?: Yes      Need for Family  Participation in Patient Care: Yes (Comment) Care giver support system in place?: Yes (comment) Current home services: DME (Patient has dme at the home including home oxygen at 3L/min [place], RW, WC, 3:1) Criminal Activity/Legal Involvement Pertinent to Current Situation/Hospitalization: No - Comment as needed  Activities of Daily Living      Permission Sought/Granted Permission sought to share information with : Case Manager, Family Supports, Chartered certified accountant granted to share information with : Yes, Verbal Permission Granted     Permission granted to share info w AGENCY: [facility]  Permission granted to share info w Relationship: son, Laverna Peace     Emotional Assessment Appearance:: Appears stated age Attitude/Demeanor/Rapport: Gracious Affect (typically observed): Accepting Orientation: : Oriented to Self, Oriented to Place, Oriented to  Time, Oriented to Situation Alcohol / Substance Use: Not Applicable Psych Involvement: No (comment)  Admission diagnosis:  Acute pancreatitis [K85.90] Hypokalemia [E87.6] Acute pancreatitis without infection or necrosis, unspecified pancreatitis type [K85.90] Patient Active Problem List   Diagnosis Date Noted   Candida esophagitis ([place]) 03/20/2022   Gastritis and gastroduodenitis 03/20/2022   Nausea without vomiting 03/20/2022   Heme positive stool 03/19/2022   Acute on chronic anemia 03/19/2022   Abdominal pain, chronic, epigastric 03/19/2022   Acute pancreatitis 03/17/2022   Dyslipidemia 03/17/2022   GERD without esophagitis 03/17/2022   Coronary artery disease 03/17/2022   Pain from implanted hardware 02/06/2022  Diabetic ulcer of toe ([place]) 11/18/2021   Pain due to onychomycosis of toenails of both feet 11/18/2021   Pressure injury of back, stage 2 ([place]) 11/03/2021   Acute on chronic respiratory failure with hypoxia ([place]) 11/02/2021   History of fracture of left hip 09/10/2021   Iatrogenic adrenal insufficiency  ([place]) 09/10/2021   T9 vertebral fracture ([place]) 09/09/2021   Acute kidney injury superimposed on chronic kidney disease ([place]) 09/09/2021   Chronic hypoxemic respiratory failure ([place]) 09/09/2021   Acute combined systolic and diastolic heart failure (HCC)    Acute CHF (congestive heart failure) ([place]) 08/04/2021   Callus 07/19/2021   Hypervolemia associated with renal insufficiency 06/11/2021   Physical deconditioning 06/11/2021   Subcutaneous fat necrosis    Pressure injury of skin 04/13/2021   Closed left hip fracture ([place]) secondary to fall at home 04/03/2021   Closed fracture of left superior pubic ramus ([place]) 04/03/2021   Chronic kidney disease, stage III B (moderate) ([place]) 04/03/2021   Fall at home, initial encounter 04/03/2021   Hypokalemia 04/03/2021   Obesity hypoventilation syndrome ([place]) 04/02/2021   Primary osteoarthritis, left ankle and foot 02/05/2021   Allergy to shellfish 01/15/2021   Decreased estrogen level 01/15/2021   Diabetic renal disease ([place]) 01/15/2021   Gallstones 01/15/2021   Idiopathic gout 01/15/2021   Mild intermittent asthma 01/15/2021   Pure hypercholesterolemia 01/15/2021   CHF (congestive heart failure) ([place]) 01/07/2021   Chronic diastolic heart failure ([place]) 12/31/2020   Nasal congestion with rhinorrhea 10/24/2020   Carotid stenosis 10/01/2020   Ischemic cardiomyopathy 09/27/2020   Hypertension 09/19/2020   Acute left-sided weakness 09/16/2020   Blood blister 08/01/2020   Elevated troponin 06/01/2019   Irritable bowel syndrome with constipation 10/21/2018   Chronic pain 07/30/2018   Congestive heart failure (CHF) ([place]) 07/30/2018   Supplemental oxygen dependent 07/30/2018   Leukopenia 05/03/2018   Thrombocytopenia ([place]) 05/03/2018   Pneumonia due to human metapneumovirus 05/02/2018   Chest pain of uncertain etiology 99991111   Allergic rhinitis 04/13/2018   S/P laparoscopic cholecystectomy 04/09/2018   Respiratory failure ([place]) 03/07/2018    Polyp of cecum    Polyp of transverse colon    Positive colorectal cancer screening using Cologuard test    Dysphagia    Acute on chronic renal failure ([place]) 11/20/2017   Coronary artery disease of native artery of native heart with stable angina pectoris ([place]) 11/20/2017   Diabetes mellitus without complication ([place]) [id number]   Abdominal pain, epigastric 11/20/2017   Vasomotor rhinitis 10/16/2016   Right facial numbness 03/05/2016   ESRD (end stage renal disease) ([place]) 03/05/2016   Atrial fibrillation ([place]) 10/24/2015   Chest pain with moderate risk of acute coronary syndrome 10/23/2015   History of TIA (transient ischemic attack) 10/22/2015   CHB (complete heart block) ([place]) 06/22/2015   Allergic drug rash due to anti-infective agent 04/29/2015   Fatigue 02/14/2015   Chronic diastolic CHF (congestive heart failure) ([place]) 02/14/2015   Cellulitis 12/21/2014   S/P CABG x 5 11/29/2014   CAD S/P percutaneous coronary angioplasty    Diarrhea 07/12/2014   Abdominal pain 07/12/2014   Acute on chronic diastolic heart failure ([place]) 11/04/2013   Mixed hypercholesterolemia and hypertriglyceridemia 04/22/2013   Vertigo 04/22/2013   Dyspnea on exertion 08/25/2012   Allergic to IV contrast 07/23/2012   Unstable angina ([place]) 07/22/2012   Obesity 11/22/2011   Cardiomyopathy ([place]) 11/22/2011   Gastroesophageal reflux disease 11/22/2011   Back pain 11/22/2011   OSA (obstructive sleep apnea) 11/22/2011  HEMORRHOIDS-EXTERNAL 02/21/2010   NAUSEA 02/21/2010   ABDOMINAL PAIN -GENERALIZED 02/21/2010   PERSONAL HX COLONIC POLYPS 02/21/2010   ANEMIA 04/16/2007   HTN (hypertension), benign 04/16/2007   DIVERTICULOSIS, COLON 04/16/2007   ARTHRITIS 04/16/2007   INTERNAL HEMORRHOIDS WITHOUT MENTION COMP 04/12/2007   Sleep apnea 04/12/2007   Cardiac resynchronization therapy pacemaker (CRT-P) in place 04/12/2007   Gastritis without bleeding 12/14/2006   COLONIC POLYPS, HYPERPLASTIC 09/27/2002    FATTY LIVER DISEASE 02/16/2002   PCP:  Lujean Amel, MD Pharmacy:  No Pharmacies Listed    Social Determinants of Health (SDOH) Social History: [place]: No Food Insecurity (02/06/2022)  Recent Concern: Food Insecurity - Food Insecurity Present (11/19/2021)  Housing: Low Risk  (02/06/2022)  Transportation Needs: Unmet Transportation Needs (02/06/2022)  Utilities: Not At Risk (02/06/2022)  Depression (PHQ2-9): Low Risk  (11/19/2021)  Financial Resource Strain: High Risk (12/18/2016)  Physical Activity: Inactive (12/18/2016)  Social Connections: Moderately Integrated (02/05/2021)  Stress: Stress Concern Present (12/18/2016)  Tobacco Use: Medium Risk (03/20/2022)   SDOH Interventions:     Readmission Risk Interventions    03/21/2022    2:22 PM 01/10/2021    9:47 AM  Readmission Risk Prevention Plan  Transportation Screening Complete Complete  PCP or Specialist Appt within 3-5 Days  Complete  HRI or [place]  Complete  Social Work Consult for [place] Planning/Counseling  Complete  Palliative Care Screening  Not Applicable  Medication Review Press photographer) Complete Complete  PCP or Specialist appointment within 3-5 days of discharge Complete   HRI or [place] Complete   SW Recovery Care/Counseling Consult Complete   Palliative Care Screening Complete   Skilled Nursing Facility Complete

## 2022-03-21 NOTE — NC FL2 (Signed)
[place] LEVEL OF CARE FORM     IDENTIFICATION  Patient Name: [name] Birthdate: 1945/06/06 Sex: female Admission Date (Current Location): 03/17/2022  [place] and [place] Number:  [name] [id number] [place] and Address:  [facility] [place] [place] [address] [place], [place] [address]      Provider Number: [id number]  Attending Physician Name and Address:  Elgergawy, Silver Huguenin, MD  Relative Name and Phone Number:  Ruchel Comp, son - [phone]    Current Level of Care: Hospital Recommended Level of Care: [place] Prior Approval Number:    Date Approved/Denied:   PASRR Number: [id number]  Discharge Plan: SNF    Current Diagnoses: Patient Active Problem List   Diagnosis Date Noted   Candida esophagitis ([place]) 03/20/2022   Gastritis and gastroduodenitis 03/20/2022   Nausea without vomiting 03/20/2022   Heme positive stool 03/19/2022   Acute on chronic anemia 03/19/2022   Abdominal pain, chronic, epigastric 03/19/2022   Acute pancreatitis 03/17/2022   Dyslipidemia 03/17/2022   GERD without esophagitis 03/17/2022   Coronary artery disease 03/17/2022   Pain from implanted hardware 02/06/2022   Diabetic ulcer of toe ([place]) 11/18/2021   Pain due to onychomycosis of toenails of both feet 11/18/2021   Pressure injury of back, stage 2 ([place]) 11/03/2021   Acute on chronic respiratory failure with hypoxia ([place]) 11/02/2021   History of fracture of left hip 09/10/2021   Iatrogenic adrenal insufficiency ([place]) 09/10/2021   T9 vertebral fracture ([place]) 09/09/2021   Acute kidney injury superimposed on chronic kidney disease ([place]) 09/09/2021   Chronic hypoxemic respiratory failure ([place]) 09/09/2021   Acute combined systolic and diastolic heart failure ([place])    Acute CHF (congestive heart failure) ([place]) 08/04/2021   Callus 07/19/2021   Hypervolemia associated with renal insufficiency 06/11/2021   Physical deconditioning  06/11/2021   Subcutaneous fat necrosis    Pressure injury of skin 04/13/2021   Closed left hip fracture ([place]) secondary to fall at home 04/03/2021   Closed fracture of left superior pubic ramus ([place]) 04/03/2021   Chronic kidney disease, stage III B (moderate) ([place]) 04/03/2021   Fall at home, initial encounter 04/03/2021   Hypokalemia 04/03/2021   Obesity hypoventilation syndrome ([place]) 04/02/2021   Primary osteoarthritis, left ankle and foot 02/05/2021   Allergy to shellfish 01/15/2021   Decreased estrogen level 01/15/2021   Diabetic renal disease ([place]) 01/15/2021   Gallstones 01/15/2021   Idiopathic gout 01/15/2021   Mild intermittent asthma 01/15/2021   Pure hypercholesterolemia 01/15/2021   CHF (congestive heart failure) ([place]) 01/07/2021   Chronic diastolic heart failure ([place]) 12/31/2020   Nasal congestion with rhinorrhea 10/24/2020   Carotid stenosis 10/01/2020   Ischemic cardiomyopathy 09/27/2020   Hypertension 09/19/2020   Acute left-sided weakness 09/16/2020   Blood blister 08/01/2020   Elevated troponin 06/01/2019   Irritable bowel syndrome with constipation 10/21/2018   Chronic pain 07/30/2018   Congestive heart failure (CHF) ([place]) 07/30/2018   Supplemental oxygen dependent 07/30/2018   Leukopenia 05/03/2018   Thrombocytopenia ([place]) 05/03/2018   Pneumonia due to human metapneumovirus 05/02/2018   Chest pain of uncertain etiology 99991111   Allergic rhinitis 04/13/2018   S/P laparoscopic cholecystectomy 04/09/2018   Respiratory failure ([place]) 03/07/2018   Polyp of cecum    Polyp of transverse colon    Positive colorectal cancer screening using Cologuard test    Dysphagia    Acute on chronic renal failure ([place]) 11/20/2017   Coronary artery disease of native artery of  native heart with stable angina pectoris ([place]) 11/20/2017   Diabetes mellitus without complication ([place]) [id number]   Abdominal pain, epigastric 11/20/2017   Vasomotor rhinitis 10/16/2016   Right  facial numbness 03/05/2016   ESRD (end stage renal disease) ([place]) 03/05/2016   Atrial fibrillation ([place]) 10/24/2015   Chest pain with moderate risk of acute coronary syndrome 10/23/2015   History of TIA (transient ischemic attack) 10/22/2015   CHB (complete heart block) (HCC) 06/22/2015   Allergic drug rash due to anti-infective agent 04/29/2015   Fatigue 02/14/2015   Chronic diastolic CHF (congestive heart failure) ([place]) 02/14/2015   Cellulitis 12/21/2014   S/P CABG x 5 11/29/2014   CAD S/P percutaneous coronary angioplasty    Diarrhea 07/12/2014   Abdominal pain 07/12/2014   Acute on chronic diastolic heart failure ([place]) 11/04/2013   Mixed hypercholesterolemia and hypertriglyceridemia 04/22/2013   Vertigo 04/22/2013   Dyspnea on exertion 08/25/2012   Allergic to IV contrast 07/23/2012   Unstable angina (HCC) 07/22/2012   Obesity 11/22/2011   Cardiomyopathy ([place]) 11/22/2011   Gastroesophageal reflux disease 11/22/2011   Back pain 11/22/2011   OSA (obstructive sleep apnea) 11/22/2011   HEMORRHOIDS-EXTERNAL 02/21/2010   NAUSEA 02/21/2010   ABDOMINAL PAIN -GENERALIZED 02/21/2010   PERSONAL HX COLONIC POLYPS 02/21/2010   ANEMIA 04/16/2007   HTN (hypertension), benign 04/16/2007   DIVERTICULOSIS, COLON 04/16/2007   ARTHRITIS 04/16/2007   INTERNAL HEMORRHOIDS WITHOUT MENTION COMP 04/12/2007   Sleep apnea 04/12/2007   Cardiac resynchronization therapy pacemaker (CRT-P) in place 04/12/2007   Gastritis without bleeding 12/14/2006   COLONIC POLYPS, HYPERPLASTIC 09/27/2002   FATTY LIVER DISEASE 02/16/2002    Orientation RESPIRATION BLADDER Height & Weight     Time, Situation, Place, Self  O2 Indwelling catheter Weight: 85 kg Height:  5' 3"$  (160 cm)  BEHAVIORAL SYMPTOMS/MOOD NEUROLOGICAL BOWEL NUTRITION STATUS      Continent Diet  AMBULATORY STATUS COMMUNICATION OF NEEDS Skin   Extensive Assist Verbally Surgical wounds                       Personal Care Assistance  Level of Assistance  Bathing, Feeding, Dressing Bathing Assistance: Limited assistance Feeding assistance: Independent Dressing Assistance: Limited assistance     Functional Limitations Info  Sight, Hearing, Speech Sight Info: Adequate Hearing Info: Adequate Speech Info: Adequate    SPECIAL CARE FACTORS FREQUENCY  PT (By licensed PT), OT (By licensed OT)     PT Frequency: 3-5 x per week OT Frequency: 3-5 x per week            Contractures Contractures Info: Not present    Additional Factors Info  Insulin Sliding Scale, Code Status, Allergies, Psychotropic Code Status Info: Full code   Psychotropic Info: IV dye, shellfish, Iodine, benadryl, Lipitor, Mitigare, Uloric, Vibratab, Keflex, Zithromax Insulin Sliding Scale Info: [place] sensitive sliding scale       Current Medications (03/21/2022):  This is the current hospital active medication list Current Facility-Administered Medications  Medication Dose Route Frequency Provider Last Rate Last Admin   acetaminophen (TYLENOL) tablet 650 mg  650 mg Oral Q6H PRN Cirigliano, Vito V, DO   650 mg at 03/18/22 2116   Or   acetaminophen (TYLENOL) suppository 650 mg  650 mg Rectal Q6H PRN Cirigliano, Vito V, DO   650 mg at 03/19/22 1606   clopidogrel (PLAVIX) tablet 75 mg  75 mg Oral Daily Elgergawy, Silver Huguenin, MD       cycloSPORINE (RESTASIS) 0.05 % ophthalmic emulsion  1 drop  1 drop Both Eyes BID Cirigliano, Vito V, DO   1 drop at 03/21/22 1001   enoxaparin (LOVENOX) injection 30 mg  30 mg Subcutaneous Q24H Elgergawy, Silver Huguenin, MD       ferrous sulfate tablet 325 mg  325 mg Oral Q breakfast Cirigliano, Vito V, DO   325 mg at 03/21/22 1000   fluconazole (DIFLUCAN) tablet 200 mg  200 mg Oral Daily Cirigliano, Vito V, DO   200 mg at [id number]   folic acid (FOLVITE) tablet 1 mg  1 mg Oral Daily Cirigliano, Vito V, DO   1 mg at 03/21/22 1001   furosemide (LASIX) tablet 40 mg  40 mg Oral BID Cirigliano, Vito V, DO   40 mg at 03/21/22  1001   insulin aspart (novoLOG) injection 0-9 Units  0-9 Units Subcutaneous Q6H Cirigliano, Vito V, DO   2 Units at 03/21/22 1234   mometasone-formoterol (DULERA) 200-5 MCG/ACT inhaler 2 puff  2 puff Inhalation BID Cirigliano, Vito V, DO   2 puff at 03/21/22 0819   morphine (PF) 2 MG/ML injection 2 mg  2 mg Intravenous Q4H PRN Cirigliano, Vito V, DO   2 mg at 03/21/22 1347   ondansetron (ZOFRAN) tablet 4 mg  4 mg Oral Q6H PRN Cirigliano, Vito V, DO       Or   ondansetron (ZOFRAN) injection 4 mg  4 mg Intravenous Q6H PRN Cirigliano, Vito V, DO   4 mg at 03/20/22 1151   oxyCODONE-acetaminophen (PERCOCET/ROXICET) 5-325 MG per tablet 1 tablet  1 tablet Oral Q6H PRN Cirigliano, Vito V, DO   1 tablet at 03/21/22 1235   pantoprazole (PROTONIX) EC tablet 40 mg  40 mg Oral BID Cirigliano, Vito V, DO   40 mg at 03/21/22 1001   polyethylene glycol (MIRALAX / GLYCOLAX) packet 17 g  17 g Oral BID Cirigliano, Vito V, DO   17 g at 03/21/22 1001   senna-docusate (Senokot-S) tablet 1 tablet  1 tablet Oral BID Cirigliano, Vito V, DO   1 tablet at 03/21/22 1001   sucralfate (CARAFATE) 1 GM/10ML suspension 1 g  1 g Oral TID WC & HS Elgergawy, Silver Huguenin, MD   1 g at 03/21/22 1000   traZODone (DESYREL) tablet 25 mg  25 mg Oral QHS PRN Cirigliano, Vito V, DO   25 mg at 03/19/22 2225     Discharge Medications: Please see discharge summary for a list of discharge medications.  Relevant Imaging Results:  Relevant Lab Results:   Additional Information SSN: [id number].  Pt is vaccinated for covid with 3 boosters.  Curlene Labrum, RN

## 2022-03-22 DIAGNOSIS — D649 Anemia, unspecified: Secondary | ICD-10-CM | POA: Diagnosis not present

## 2022-03-22 DIAGNOSIS — B3781 Candidal esophagitis: Secondary | ICD-10-CM | POA: Diagnosis not present

## 2022-03-22 LAB — CBC
HCT: 28.5 % — ABNORMAL LOW (ref 36.0–46.0)
Hemoglobin: 8.7 g/dL — ABNORMAL LOW (ref 12.0–15.0)
MCH: 28.7 pg (ref 26.0–34.0)
MCHC: 30.5 g/dL (ref 30.0–36.0)
MCV: 94.1 fL (ref 80.0–100.0)
Platelets: 141 10*3/uL — ABNORMAL LOW (ref 150–400)
RBC: 3.03 MIL/uL — ABNORMAL LOW (ref 3.87–5.11)
RDW: 16.8 % — ABNORMAL HIGH (ref 11.5–15.5)
WBC: 8.2 10*3/uL (ref 4.0–10.5)
nRBC: 0 % (ref 0.0–0.2)

## 2022-03-22 LAB — BASIC METABOLIC PANEL
Anion gap: 10 (ref 5–15)
BUN: 29 mg/dL — ABNORMAL HIGH (ref 8–23)
CO2: 32 mmol/L (ref 22–32)
Calcium: 8.8 mg/dL — ABNORMAL LOW (ref 8.9–10.3)
Chloride: 94 mmol/L — ABNORMAL LOW (ref 98–111)
Creatinine, Ser: 2.17 mg/dL — ABNORMAL HIGH (ref 0.44–1.00)
GFR, Estimated: 23 mL/min — ABNORMAL LOW (ref >60)
Glucose, Bld: 153 mg/dL — ABNORMAL HIGH (ref 70–99)
Potassium: 3.8 mmol/L (ref 3.5–5.1)
Sodium: 136 mmol/L (ref 135–145)

## 2022-03-22 LAB — GLUCOSE, CAPILLARY
Glucose-Capillary: 126 mg/dL — ABNORMAL HIGH (ref 70–99)
Glucose-Capillary: 150 mg/dL — ABNORMAL HIGH (ref 70–99)
Glucose-Capillary: 159 mg/dL — ABNORMAL HIGH (ref 70–99)
Glucose-Capillary: 165 mg/dL — ABNORMAL HIGH (ref 70–99)

## 2022-03-22 NOTE — Progress Notes (Signed)
PROGRESS NOTE  [name] [id number] DOB: 1945/04/13 DOA: 03/17/2022 PCP: Lujean Amel, MD  HPI/brief narrative  [name] is a 77 y.o. female with medical history significant for asthma, atrial fibrillation, coronary artery disease, status post PCI and stent, GERD, gout, hypertension, dyslipidemia and OSA as well as CVA and type 2 diabetes mellitus, who presented to the ER with generalized abdominal pain which have been worsening over the last 3 weeks with associated anorexia without nausea or vomiting. In the ED, VSS, labs revealed hypokalemia of 2.9 and hypochloremia of 90. Lipase level came back 1508 and total protein 6.1.  CBC showed anemia with hemoglobin 7.9 hematocrit 26.7 close to baseline.  CT abdomen/pelvis with no acute findings, pancreas unremarkable, rectum moderately distended with stool.  Right upper quadrant with hepatic steatosis without focal liver lesions, noted right renal cyst, follow-up with repeat CAT scan in 6-week as recommended.  Patient admitted for further management.   Subjective  No significant events overnight, she denies any complaints today, no nausea, no vomiting, tolerating soft diet   Assessment/Plan: Principal Problem:   Acute pancreatitis Active Problems:   Hypokalemia   Atrial fibrillation (HCC)   Dyslipidemia   GERD without esophagitis   Coronary artery disease   Heme positive stool   Acute on chronic anemia   Abdominal pain, chronic, epigastric   Candida esophagitis (HCC)   Gastritis and gastroduodenitis   Nausea without vomiting   Generalized abdominal pain due to candidal esophagitis Acute pancreatitis ruled out Reports generalized abdominal pain and nausea,  with no CT findings of pancreatitis -Lipase level significantly elevated on admission, trending down, discussed with GI elevated lipase most likely in the setting of esophagitis, no evidence of bile duct dilation, no history of alcohol use. -Advance to soft diet  which she is tolerating currently.  Candidal esophagitis Hemoccult positive stool Noted drop in hgb (baseline around 8-9), dropped to 7 Anemia panel showed iron 38, TIBC 213, ferritin 738, sats 18 FOBT positive She received 1 unit PRBC with good response Went for endoscopy, significant for esophageal plaques suspicious for candidiasis, which was biopsied, recommendation to continue with Protonix 40 mg p.o. twice daily, Carafate suspension x 4 weeks, and fluconazole x 21 days  CKD stage IV Anemia of chronic kidney disease Baseline creatinine around 2.5 Daily BMP   Chronic hypoxic respiratory failure COPD On 3L of O2 chronically  Chronic diastolic and systolic HF Coronary artery disease Stopped IVF given hx of fluid overload with CHF Continue [employer] Plavix and aspirin initially, will resume back on Plavix PRN sublingual nitroglycerin -Back on Lasix   Dyslipidemia TG WNL Continue statin therapy  Complete heart block Status post pacemaker  Constipation CT a/p showed moderate constipation Started bowel regimen, refuses linzess  OSA CPAP  Obesity Lifestyle modifications advised  Wheelchair bound     Pressure Injury 03/17/22 Sacrum Mid Stage 2 -  Partial thickness loss of dermis presenting as a shallow open injury with a red, pink wound bed without slough. small open section, can see previous healed wound (Active)  03/17/22 2315  Location: Sacrum  Location Orientation: Mid  Staging: Stage 2 -  Partial thickness loss of dermis presenting as a shallow open injury with a red, pink wound bed without slough.  Wound Description (Comments): small open section, can see previous healed wound  Present on Admission:   Dressing Type Foam - Lift dressing to assess site every shift 03/22/22 0810         Estimated  body mass index is 33.19 kg/m as calculated from the following:   Height as of this encounter: 5' 3"$  (1.6 m).   Weight as of this encounter: 85 kg.       Code Status: Full  Family Communication: None at bedside  Disposition Plan: Status is: Inpatient She  is cleared to go back to SNF, awaiting insurance authorization      Consultants: GI  Procedures: 2/8  Antimicrobials: None  DVT prophylaxis: SCDs lovenox   Objective: Vitals:   03/22/22 0438 03/22/22 0439 03/22/22 0747 03/22/22 0824  BP: 132/60 132/60 (!) 140/91   Pulse: 85 85 82   Resp: 17 17 17   $ Temp:  97.9 F (36.6 C) 97.7 F (36.5 C)   TempSrc:  Oral Oral   SpO2: 100% 100% 100% 99%  Weight:      Height:        Intake/Output Summary (Last 24 hours) at 03/22/2022 1129 Last data filed at 03/22/2022 0500 Gross per 24 hour  Intake --  Output 1050 ml  Net -1050 ml   Filed Weights   03/17/22 1612  Weight: 85 kg    Exam:  Awake Alert, Oriented X 3, No new F.N deficits, Normal affect Symmetrical Chest wall movement, Good air movement bilaterally, CTAB RRR,No Gallops,Rubs or new Murmurs, No Parasternal Heave +ve B.Sounds, Abd Soft, No tenderness, No rebound - guarding or rigidity. No Cyanosis, Clubbing or edema, No new Rash or bruise        Data Reviewed: CBC: Recent Labs  Lab 03/17/22 1545 03/18/22 0538 03/19/22 0448 03/19/22 1903 03/20/22 0349 03/21/22 0350 03/22/22 0221  WBC 8.0   < > 4.9 7.4 6.6 7.2 8.2  NEUTROABS 5.7  --  2.8  --   --   --   --   HGB 7.9*   < > 7.4* 9.7* 9.4* 9.7* 8.7*  HCT 26.7*   < > 24.3* 30.5* 29.5* 31.6* 28.5*  MCV 97.4   < > 96.0 91.6 91.0 92.9 94.1  PLT 180   < > 134* 138* 143* 146* 141*   < > = values in this interval not displayed.   Basic Metabolic Panel: Recent Labs  Lab 03/18/22 0538 03/19/22 0448 03/20/22 0349 03/21/22 0350 03/22/22 0221  NA 141 138 136 136 136  K 3.5 3.4* 3.9 3.9 3.8  CL 99 100 95* 95* 94*  CO2 33* 29 34* 30 32  GLUCOSE 124* 101* 115* 129* 153*  BUN 42* 38* 31* 28* 29*  CREATININE 2.22* 1.93* 1.76* 1.73* 2.17*  CALCIUM 8.9 8.9 9.0 8.9 8.8*   GFR: Estimated  Creatinine Clearance: 22.4 mL/min (A) (by C-G formula based on SCr of 2.17 mg/dL (H)). Liver Function Tests: Recent Labs  Lab 03/17/22 1545  AST 25  ALT 18  ALKPHOS 173*  BILITOT 0.5  PROT 6.1*  ALBUMIN 2.6*   Recent Labs  Lab 03/17/22 1545 03/18/22 0538 03/19/22 0448 03/20/22 0349 03/21/22 0350  LIPASE 1,308* 529* 382* 314* 323*   No results for input(s): "AMMONIA" in the last 168 hours. Coagulation Profile: No results for input(s): "INR", "PROTIME" in the last 168 hours. Cardiac Enzymes: No results for input(s): "CKTOTAL", "CKMB", "CKMBINDEX", "TROPONINI" in the last 168 hours. BNP (last 3 results) No results for input(s): "PROBNP" in the last 8760 hours. HbA1C: No results for input(s): "HGBA1C" in the last 72 hours. CBG: Recent Labs  Lab 03/21/22 0758 03/21/22 1155 03/21/22 1746 03/21/22 2320 03/22/22 0527  GLUCAP 122* 153* 149* 160* 159*  Lipid Profile: No results for input(s): "CHOL", "HDL", "LDLCALC", "TRIG", "CHOLHDL", "LDLDIRECT" in the last 72 hours.  Thyroid Function Tests: No results for input(s): "TSH", "T4TOTAL", "FREET4", "T3FREE", "THYROIDAB" in the last 72 hours. Anemia Panel: No results for input(s): "VITAMINB12", "FOLATE", "FERRITIN", "TIBC", "IRON", "RETICCTPCT" in the last 72 hours. Urine analysis:    Component Value Date/Time   COLORURINE YELLOW 03/17/2022 1530   APPEARANCEUR CLEAR 03/17/2022 1530   LABSPEC 1.016 03/17/2022 1530   PHURINE 5.0 03/17/2022 1530   GLUCOSEU NEGATIVE 03/17/2022 1530   HGBUR NEGATIVE 03/17/2022 1530   BILIRUBINUR NEGATIVE 03/17/2022 1530   KETONESUR NEGATIVE 03/17/2022 1530   PROTEINUR 30 (A) 03/17/2022 1530   UROBILINOGEN 0.2 12/06/2014 1531   NITRITE NEGATIVE 03/17/2022 1530   LEUKOCYTESUR TRACE (A) 03/17/2022 1530   Sepsis Labs: @LABRCNTIP$ (procalcitonin:4,lacticidven:4)  )No results found for this or any previous visit (from the past 240 hour(s)).    Studies: No results found.  Scheduled  Meds:  clopidogrel  75 mg Oral Daily   cycloSPORINE  1 drop Both Eyes BID   enoxaparin (LOVENOX) injection  30 mg Subcutaneous Q24H   ferrous sulfate  325 mg Oral Q breakfast   fluconazole  200 mg Oral Daily   folic acid  1 mg Oral Daily   furosemide  40 mg Oral BID   insulin aspart  0-9 Units Subcutaneous Q6H   mometasone-formoterol  2 puff Inhalation BID   pantoprazole  40 mg Oral BID   polyethylene glycol  17 g Oral BID   senna-docusate  1 tablet Oral BID   sucralfate  1 g Oral TID WC & HS    Continuous Infusions:      LOS: 5 days     Phillips Climes, MD [facility]  If 7PM-7AM, please contact night-coverage [email or web address] 03/22/2022, 11:29 AM

## 2022-03-22 NOTE — Progress Notes (Signed)
Biopsies from the upper endoscopy demonstrate gastritis with focal intestinal metaplasia, but no dysplasia.  H. pylori stains are still pending and will relay to the primary team if these returned as positive.  I do not recommend surveillance of the intestinal metaplasia based on overall comorbidities.  Esophageal biopsies demonstrate reflux changes.  Overall, no change to medication plan as was outlined at the time of upper endoscopy.   Gerrit Heck, DO, [facility]

## 2022-03-23 DIAGNOSIS — B3781 Candidal esophagitis: Secondary | ICD-10-CM | POA: Diagnosis not present

## 2022-03-23 LAB — GLUCOSE, CAPILLARY
Glucose-Capillary: 142 mg/dL — ABNORMAL HIGH (ref 70–99)
Glucose-Capillary: 128 mg/dL — ABNORMAL HIGH (ref 70–99)
Glucose-Capillary: 119 mg/dL — ABNORMAL HIGH (ref 70–99)
Glucose-Capillary: 126 mg/dL — ABNORMAL HIGH (ref 70–99)

## 2022-03-23 LAB — BASIC METABOLIC PANEL
Anion gap: 12 (ref 5–15)
BUN: 31 mg/dL — ABNORMAL HIGH (ref 8–23)
CO2: 32 mmol/L (ref 22–32)
Calcium: 8.9 mg/dL (ref 8.9–10.3)
Chloride: 92 mmol/L — ABNORMAL LOW (ref 98–111)
Creatinine, Ser: 2.01 mg/dL — ABNORMAL HIGH (ref 0.44–1.00)
GFR, Estimated: 25 mL/min — ABNORMAL LOW (ref >60)
Glucose, Bld: 145 mg/dL — ABNORMAL HIGH (ref 70–99)
Potassium: 3.6 mmol/L (ref 3.5–5.1)
Sodium: 136 mmol/L (ref 135–145)

## 2022-03-23 LAB — CBC
HCT: 30.5 % — ABNORMAL LOW (ref 36.0–46.0)
Hemoglobin: 9.3 g/dL — ABNORMAL LOW (ref 12.0–15.0)
MCH: 28.8 pg (ref 26.0–34.0)
MCHC: 30.5 g/dL (ref 30.0–36.0)
MCV: 94.4 fL (ref 80.0–100.0)
Platelets: 129 10*3/uL — ABNORMAL LOW (ref 150–400)
RBC: 3.23 MIL/uL — ABNORMAL LOW (ref 3.87–5.11)
RDW: 16.8 % — ABNORMAL HIGH (ref 11.5–15.5)
WBC: 8.2 10*3/uL (ref 4.0–10.5)
nRBC: 0 % (ref 0.0–0.2)

## 2022-03-23 MED ORDER — ORAL CARE MOUTH RINSE: 15.0000 mL | OROMUCOSAL | Status: DC | PRN

## 2022-03-23 MED ORDER — BISACODYL 5 MG PO TBEC
10.0000 mg | DELAYED_RELEASE_TABLET | Freq: Once | ORAL | Status: AC
  Administered 2022-03-23: 10 mg via ORAL
  Filled 2022-03-23: qty 2

## 2022-03-23 NOTE — Progress Notes (Signed)
PROGRESS NOTE  [name] [id number] DOB: [birth year] DOA: 03/17/2022 PCP: Lujean Amel, MD  HPI/brief narrative  [name] is a 77 y.o. female with medical history significant for asthma, atrial fibrillation, coronary artery disease, status post PCI and stent, GERD, gout, hypertension, dyslipidemia and OSA as well as CVA and type 2 diabetes mellitus, who presented to the ER with generalized abdominal pain which have been worsening over the last 3 weeks with associated anorexia without nausea or vomiting. In the ED, VSS, labs revealed hypokalemia of 2.9 and hypochloremia of 90. Lipase level came back 1508 and total protein 6.1.  CBC showed anemia with hemoglobin 7.9 hematocrit 26.7 close to baseline.  CT abdomen/pelvis with no acute findings, pancreas unremarkable, rectum moderately distended with stool.  Right upper quadrant with hepatic steatosis without focal liver lesions, noted right renal cyst, follow-up with repeat CAT scan in 6-week as recommended.  Patient admitted for further management.   Subjective  No significant events overnight as discussed with staff, she is having good BM, still reports some epigastric pain secondary to esophagitis, but much improved.   Assessment/Plan: Principal Problem:   Acute pancreatitis Active Problems:   Hypokalemia   Atrial fibrillation (HCC)   Dyslipidemia   GERD without esophagitis   Coronary artery disease   Heme positive stool   Acute on chronic anemia   Abdominal pain, chronic, epigastric   Candida esophagitis (HCC)   Gastritis and gastroduodenitis   Nausea without vomiting   Generalized abdominal pain due to candidal esophagitis Acute pancreatitis ruled out Reports generalized abdominal pain and nausea,  with no CT findings of pancreatitis -Lipase level significantly elevated on admission, trending down, discussed with GI elevated lipase most likely in the setting of esophagitis, no evidence of bile duct dilation,  no history of alcohol use. -Advance to soft diet which she is tolerating currently.  Candidal esophagitis Hemoccult positive stool Noted drop in hgb (baseline around 8-9), dropped to 7 Anemia panel showed iron 38, TIBC 213, ferritin 738, sats 18 FOBT positive She received 1 unit PRBC with good response Went for endoscopy, significant for esophageal plaques suspicious for candidiasis, which was biopsied, recommendation to continue with Protonix 40 mg p.o. twice daily, Carafate suspension x 4 weeks, and fluconazole x 21 days -Follow on H. pylori  CKD stage IV Anemia of chronic kidney disease Baseline creatinine around 2.5 Daily BMP   Chronic hypoxic respiratory failure COPD On 3L of O2 chronically  Chronic diastolic and systolic HF Coronary artery disease Stopped IVF given hx of fluid overload with CHF Continue [employer] Plavix and aspirin initially, will resume back on Plavix PRN sublingual nitroglycerin -Back on Lasix   Dyslipidemia TG WNL Continue statin therapy  Complete heart block Status post pacemaker  Constipation CT a/p showed moderate constipation Started bowel regimen, refuses linzess  OSA CPAP  Obesity Lifestyle modifications advised  Wheelchair bound     Pressure Injury 03/17/22 Sacrum Mid Stage 2 -  Partial thickness loss of dermis presenting as a shallow open injury with a red, pink wound bed without slough. small open section, can see previous healed wound (Active)  03/17/22 2315  Location: Sacrum  Location Orientation: Mid  Staging: Stage 2 -  Partial thickness loss of dermis presenting as a shallow open injury with a red, pink wound bed without slough.  Wound Description (Comments): small open section, can see previous healed wound  Present on Admission:   Dressing Type Foam - Lift dressing to assess site every  shift 03/22/22 2259         Estimated body mass index is 33.19 kg/m as calculated from the following:   Height as of this  encounter: 5' 3"$  (1.6 m).   Weight as of this encounter: 85 kg.      Code Status: Full  Family Communication: None at bedside  Disposition Plan: Status is: Inpatient She  is cleared to go back to SNF, awaiting insurance authorization      Consultants: GI  Procedures: 2/8  Antimicrobials: None  DVT prophylaxis: SCDs lovenox   Objective: Vitals:   03/22/22 1941 03/22/22 2116 03/23/22 0448 03/23/22 0702  BP: 139/60  (!) 124/53 (!) 104/58  Pulse: 88 82 71 72  Resp: 17  17 17  $ Temp: 98.7 F (37.1 C)  98.7 F (37.1 C) 98 F (36.7 C)  TempSrc: Oral  Oral Oral  SpO2: (!) 84% 100% 100% 97%  Weight:      Height:        Intake/Output Summary (Last 24 hours) at 03/23/2022 1320 Last data filed at 03/23/2022 0532 Gross per 24 hour  Intake --  Output 1800 ml  Net -1800 ml   Filed Weights   03/17/22 1612  Weight: 85 kg    Exam:  Awake Alert, Oriented X 3, No new F.N deficits, Normal affect Symmetrical Chest wall movement, Good air movement bilaterally, CTAB RRR,No Gallops,Rubs or new Murmurs, No Parasternal Heave +ve B.Sounds, Abd Soft, No tenderness, No rebound - guarding or rigidity. No Cyanosis, Clubbing or edema, No new Rash or bruise        Data Reviewed: CBC: Recent Labs  Lab 03/17/22 1545 03/18/22 0538 03/19/22 0448 03/19/22 1903 03/20/22 0349 03/21/22 0350 03/22/22 0221 03/23/22 0611  WBC 8.0   < > 4.9 7.4 6.6 7.2 8.2 8.2  NEUTROABS 5.7  --  2.8  --   --   --   --   --   HGB 7.9*   < > 7.4* 9.7* 9.4* 9.7* 8.7* 9.3*  HCT 26.7*   < > 24.3* 30.5* 29.5* 31.6* 28.5* 30.5*  MCV 97.4   < > 96.0 91.6 91.0 92.9 94.1 94.4  PLT 180   < > 134* 138* 143* 146* 141* 129*   < > = values in this interval not displayed.   Basic Metabolic Panel: Recent Labs  Lab 03/19/22 0448 03/20/22 0349 03/21/22 0350 03/22/22 0221 03/23/22 0611  NA 138 136 136 136 136  K 3.4* 3.9 3.9 3.8 3.6  CL 100 95* 95* 94* 92*  CO2 29 34* 30 32 32  GLUCOSE 101* 115*  129* 153* 145*  BUN 38* 31* 28* 29* 31*  CREATININE 1.93* 1.76* 1.73* 2.17* 2.01*  CALCIUM 8.9 9.0 8.9 8.8* 8.9   GFR: Estimated Creatinine Clearance: 24.2 mL/min (A) (by C-G formula based on SCr of 2.01 mg/dL (H)). Liver Function Tests: Recent Labs  Lab 03/17/22 1545  AST 25  ALT 18  ALKPHOS 173*  BILITOT 0.5  PROT 6.1*  ALBUMIN 2.6*   Recent Labs  Lab 03/17/22 1545 03/18/22 0538 03/19/22 0448 03/20/22 0349 03/21/22 0350  LIPASE 1,308* 529* 382* 314* 323*   No results for input(s): "AMMONIA" in the last 168 hours. Coagulation Profile: No results for input(s): "INR", "PROTIME" in the last 168 hours. Cardiac Enzymes: No results for input(s): "CKTOTAL", "CKMB", "CKMBINDEX", "TROPONINI" in the last 168 hours. BNP (last 3 results) No results for input(s): "PROBNP" in the last 8760 hours. HbA1C: No results for input(s): "HGBA1C"  in the last 72 hours. CBG: Recent Labs  Lab 03/22/22 1144 03/22/22 1737 03/22/22 2346 03/23/22 0600 03/23/22 1142  GLUCAP 165* 150* 126* 142* 128*   Lipid Profile: No results for input(s): "CHOL", "HDL", "LDLCALC", "TRIG", "CHOLHDL", "LDLDIRECT" in the last 72 hours.  Thyroid Function Tests: No results for input(s): "TSH", "T4TOTAL", "FREET4", "T3FREE", "THYROIDAB" in the last 72 hours. Anemia Panel: No results for input(s): "VITAMINB12", "FOLATE", "FERRITIN", "TIBC", "IRON", "RETICCTPCT" in the last 72 hours. Urine analysis:    Component Value Date/Time   COLORURINE YELLOW 03/17/2022 1530   APPEARANCEUR CLEAR 03/17/2022 1530   LABSPEC 1.016 03/17/2022 1530   PHURINE 5.0 03/17/2022 1530   GLUCOSEU NEGATIVE 03/17/2022 1530   HGBUR NEGATIVE 03/17/2022 1530   BILIRUBINUR NEGATIVE 03/17/2022 1530   KETONESUR NEGATIVE 03/17/2022 1530   PROTEINUR 30 (A) 03/17/2022 1530   UROBILINOGEN 0.2 12/06/2014 1531   NITRITE NEGATIVE 03/17/2022 1530   LEUKOCYTESUR TRACE (A) 03/17/2022 1530   Sepsis  Labs: @LABRCNTIP$ (procalcitonin:4,lacticidven:4)  )No results found for this or any previous visit (from the past 240 hour(s)).    Studies: No results found.  Scheduled Meds:  bisacodyl  10 mg Oral Once   clopidogrel  75 mg Oral Daily   cycloSPORINE  1 drop Both Eyes BID   enoxaparin (LOVENOX) injection  30 mg Subcutaneous Q24H   ferrous sulfate  325 mg Oral Q breakfast   fluconazole  200 mg Oral Daily   folic acid  1 mg Oral Daily   furosemide  40 mg Oral BID   insulin aspart  0-9 Units Subcutaneous Q6H   mometasone-formoterol  2 puff Inhalation BID   pantoprazole  40 mg Oral BID   polyethylene glycol  17 g Oral BID   senna-docusate  1 tablet Oral BID   sucralfate  1 g Oral TID WC & HS    Continuous Infusions:      LOS: 6 days     Phillips Climes, MD [facility]  If 7PM-7AM, please contact night-coverage [email or web address] 03/23/2022, 1:20 PM

## 2022-03-23 NOTE — Progress Notes (Signed)
Mobility Specialist Progress Note   03/22/22 1245  Mobility  Activity Repositioned in chair (+ seated exercises)  Level of Assistance Standby assist, set-up cues, supervision of patient - no hands on  Assistive Device Other (Comment) (HHA)  Range of Motion/Exercises All extremities  LLE Weight Bearing PWB  LLE Partial Weight Bearing Percentage or Pounds 50  Activity Response Tolerated well  Mobility Referral Yes  $Mobility charge 1 Mobility   Pt deferring hallway ambulation but agreeable to seated level exercises. C/o pain in bilat. feet expessing a "prickly" sensation prior to starting. Demonstrating decent strength through x6 exercises but LLE limited in strength and restricted by pain. No faults or new complaints post mobility, left w/ call bell in reach and chair alarm on.   Holland Falling Mobility Specialist Please contact via SecureChat or  Rehab office at [phone]

## 2022-03-24 ENCOUNTER — Encounter (HOSPITAL_COMMUNITY): Payer: Self-pay | Admitting: Gastroenterology

## 2022-03-24 DIAGNOSIS — Z7401 Bed confinement status: Secondary | ICD-10-CM | POA: Diagnosis not present

## 2022-03-24 DIAGNOSIS — Z743 Need for continuous supervision: Secondary | ICD-10-CM | POA: Diagnosis not present

## 2022-03-24 DIAGNOSIS — M84359A Stress fracture, hip, unspecified, initial encounter for fracture: Secondary | ICD-10-CM | POA: Diagnosis not present

## 2022-03-24 DIAGNOSIS — J9611 Chronic respiratory failure with hypoxia: Secondary | ICD-10-CM | POA: Diagnosis not present

## 2022-03-24 DIAGNOSIS — U071 COVID-19: Secondary | ICD-10-CM | POA: Diagnosis not present

## 2022-03-24 DIAGNOSIS — D649 Anemia, unspecified: Secondary | ICD-10-CM | POA: Diagnosis not present

## 2022-03-24 DIAGNOSIS — R1013 Epigastric pain: Secondary | ICD-10-CM | POA: Diagnosis not present

## 2022-03-24 DIAGNOSIS — G8929 Other chronic pain: Secondary | ICD-10-CM

## 2022-03-24 DIAGNOSIS — R531 Weakness: Secondary | ICD-10-CM | POA: Diagnosis not present

## 2022-03-24 DIAGNOSIS — R109 Unspecified abdominal pain: Secondary | ICD-10-CM | POA: Diagnosis not present

## 2022-03-24 DIAGNOSIS — I5033 Acute on chronic diastolic (congestive) heart failure: Secondary | ICD-10-CM | POA: Diagnosis not present

## 2022-03-24 DIAGNOSIS — B3781 Candidal esophagitis: Secondary | ICD-10-CM | POA: Diagnosis not present

## 2022-03-24 LAB — GLUCOSE, CAPILLARY
Glucose-Capillary: 157 mg/dL — ABNORMAL HIGH (ref 70–99)
Glucose-Capillary: 80 mg/dL (ref 70–99)
Glucose-Capillary: 81 mg/dL (ref 70–99)

## 2022-03-24 LAB — CBC
HCT: 28.7 % — ABNORMAL LOW (ref 36.0–46.0)
Hemoglobin: 9 g/dL — ABNORMAL LOW (ref 12.0–15.0)
MCH: 29.3 pg (ref 26.0–34.0)
MCHC: 31.4 g/dL (ref 30.0–36.0)
MCV: 93.5 fL (ref 80.0–100.0)
Platelets: 124 10*3/uL — ABNORMAL LOW (ref 150–400)
RBC: 3.07 MIL/uL — ABNORMAL LOW (ref 3.87–5.11)
RDW: 16.8 % — ABNORMAL HIGH (ref 11.5–15.5)
WBC: 5.2 10*3/uL (ref 4.0–10.5)
nRBC: 0 % (ref 0.0–0.2)

## 2022-03-24 MED ORDER — FOLIC ACID 1 MG PO TABS: 1.0000 mg | ORAL_TABLET | Freq: Every day | ORAL | 0 refills | Status: DC

## 2022-03-24 MED ORDER — FLUCONAZOLE 200 MG PO TABS: 200.0000 mg | ORAL_TABLET | Freq: Every day | ORAL | 0 refills | Status: AC

## 2022-03-24 MED ORDER — SUCRALFATE 1 GM/10ML PO SUSP: 1.0000 g | Freq: Three times a day (TID) | ORAL | 0 refills | Status: DC

## 2022-03-24 MED ORDER — ISOSORBIDE MONONITRATE ER 30 MG PO TB24: 60.0000 mg | ORAL_TABLET | Freq: Every day | ORAL | 3 refills | Status: DC

## 2022-03-24 MED ORDER — OXYCODONE HCL 5 MG PO TABS: 5.0000 mg | ORAL_TABLET | Freq: Four times a day (QID) | ORAL | 0 refills | Status: DC | PRN

## 2022-03-24 MED ORDER — ACETAMINOPHEN 325 MG PO TABS: 650.0000 mg | ORAL_TABLET | Freq: Four times a day (QID) | ORAL | Status: DC | PRN

## 2022-03-24 NOTE — Progress Notes (Signed)
Physical Therapy Treatment Patient Details Name: [name] MRN: [id number] DOB: Jun 26, 1945 Today's Date: 03/24/2022   History of Present Illness 77 y/o F admitted 03/17/22 from [facility] with abdominal pain, renal cyst and acute pancreatitis. 2/8 endoscopy with bx. PMHx: 02/12/22 Pt s/p removal of left femoral nail, conversion of Lt THA with ORIF of trochanter. asthma, A-fib, CAD, GERD, gout, HTN, dyslipidemia, OSA, CVA, T2DM    PT Comments    Pt pleasant and reports abdominal pain limiting function. Pt able to stand and pivot to chair but denied gait, HEP or further activity despite education and encouragement. Pt reports ultimate goal to return home after SNF but demonstrating limited tolerance for activity. Will continue to follow and encouraged attempts to walk to bathroom with nursing.     Recommendations for follow up therapy are one component of a multi-disciplinary discharge planning process, led by the attending physician.  Recommendations may be updated based on patient status, additional functional criteria and insurance authorization.  Follow Up Recommendations  Skilled nursing-short term rehab (<3 hours/day) Can patient physically be transported by private vehicle: Yes   Assistance Recommended at Discharge Frequent or constant Supervision/Assistance  Patient can return home with the following A little help with walking and/or transfers;A little help with bathing/dressing/bathroom;Assistance with cooking/housework;Assist for transportation;Help with stairs or ramp for entrance   Equipment Recommendations  None recommended by PT    Recommendations for Other Services       Precautions / Restrictions Precautions Precautions: Fall Precaution Comments: no active hip abduction LLE, incontinent of bowel and bladder Restrictions LLE Weight Bearing: Partial weight bearing LLE Partial Weight Bearing Percentage or Pounds: 50     Mobility  Bed Mobility Overal bed  mobility: Needs Assistance Bed Mobility: Supine to Sit     Supine to sit: Min assist, HOB elevated     General bed mobility comments: min assist to fully lift trunk from surface, good placement of legs adhering to precautions, HOB 30 degrees    Transfers Overall transfer level: Needs assistance   Transfers: Sit to/from Stand, Bed to chair/wheelchair/BSC Sit to Stand: Min guard Stand pivot transfers: Min guard         General transfer comment: pt with appropriate use of RW and able to stand and pivot from bed to recliner, pt incontinent of bowel during transfers but refused additional pivot to bSC, additional stand for pericare    Ambulation/Gait               General Gait Details: pt refused attempting   Stairs             Wheelchair Mobility    Modified Rankin (Stroke Patients Only)       Balance Overall balance assessment: Needs assistance   Sitting balance-Leahy Scale: Fair Sitting balance - Comments: supervision static sitting   Standing balance support: Bilateral upper extremity supported, During functional activity, Reliant on assistive device for balance Standing balance-Leahy Scale: Poor Standing balance comment: bil UE on RW                            Cognition Arousal/Alertness: Awake/alert Behavior During Therapy: WFL for tasks assessed/performed Overall Cognitive Status: Within Functional Limits for tasks assessed                                 General Comments: pt self-limiting  Exercises      General Comments        Pertinent Vitals/Pain Pain Assessment Pain Score: 7  Pain Location: abdomen Pain Descriptors / Indicators: Aching, Constant Pain Intervention(s): Limited activity within patient's tolerance, Repositioned, RN gave pain meds during session    Home Living                          Prior Function            PT Goals (current goals can now be found in the care plan  section) Progress towards PT goals: Not progressing toward goals - comment (pt able to stand but refuses gait or HEP)    Frequency    Min 2X/week      PT Plan Current plan remains appropriate    Co-evaluation              AM-PAC PT "6 Clicks" Mobility   Outcome Measure  Help needed turning from your back to your side while in a flat bed without using bedrails?: A Little Help needed moving from lying on your back to sitting on the side of a flat bed without using bedrails?: A Little Help needed moving to and from a bed to a chair (including a wheelchair)?: A Little Help needed standing up from a chair using your arms (e.g., wheelchair or bedside chair)?: A Little Help needed to walk in hospital room?: Total Help needed climbing 3-5 steps with a railing? : Total 6 Click Score: 14    End of Session Equipment Utilized During Treatment: Gait belt Activity Tolerance: Patient limited by pain Patient left: in chair;with call bell/phone within reach;with nursing/sitter in room;with chair alarm set Nurse Communication: Mobility status PT Visit Diagnosis: Difficulty in walking, not elsewhere classified (R26.2);Muscle weakness (generalized) (M62.81);Other abnormalities of gait and mobility (R26.89)     Time: [id number] PT Time Calculation (min) (ACUTE ONLY): 18 min  Charges:  $Therapeutic Activity: 8-22 mins                     Bayard Males, PT Acute Rehabilitation Services Office: [phone]    Sandy Salaam Judeth Gilles 03/24/2022, 12:02 PM

## 2022-03-24 NOTE — Discharge Summary (Addendum)
Physician Discharge Summary  [name] [id number] DOB: Jul 05, 1945 DOA: 03/17/2022  PCP: Lujean Amel, MD  Admit date: 03/17/2022 Discharge date: 03/24/2022  Admitted From: ( SNF) Disposition:  (snf)  Recommendations for Outpatient Follow-up:  Please obtain BMP/CBC in one week Please follow up on the final results on H Pylori biopsy.   Discharge Condition: (Stable) CODE STATUS: (FULL) Diet recommendation: Heart Healthy   Brief/Interim Summary:  [name] is a 77 y.o. female with medical history significant for asthma, atrial fibrillation, coronary artery disease, status post PCI and stent, GERD, gout, hypertension, dyslipidemia and OSA as well as CVA and type 2 diabetes mellitus, who presented to the ER with generalized abdominal pain which have been worsening over the last 3 weeks with associated anorexia without nausea or vomiting. In the ED, VSS, labs revealed hypokalemia of 2.9 and hypochloremia of 90. Lipase level came back 1508 and total protein 6.1.  CBC showed anemia with hemoglobin 7.9 hematocrit 26.7 close to baseline.  CT abdomen/pelvis with no acute findings, pancreas unremarkable, rectum moderately distended with stool.  Right upper quadrant with hepatic steatosis without focal liver lesions, noted right renal cyst, follow-up with repeat CAT scan in 6-week as recommended.  Patient admitted for further management.       Generalized abdominal pain due to candidal esophagitis Acute pancreatitis ruled out - Reports generalized abdominal pain and nausea,  with no CT findings of pancreatitis -Lipase level significantly elevated on admission, trending down, discussed with GI elevated lipase most likely in the setting of esophagitis, no evidence of bile duct dilation, no history of alcohol use.   Candidal esophagitis Hemoccult positive stool Noted drop in hgb (baseline around 8-9), dropped to 7 Anemia panel showed iron 38, TIBC 213, ferritin 738, sats 18 FOBT  positive She received 1 unit PRBC with good response Went for endoscopy, significant for esophageal plaques suspicious for candidiasis, which was biopsied, recommendation to continue with Protonix 40 mg p.o. twice daily, Carafate suspension x 4 weeks, and fluconazole x 21 days -Follow on H. Pylori.   CKD stage IV Anemia of chronic kidney disease Baseline creatinine around 2.5 Daily BMP    Chronic hypoxic respiratory failure COPD On 3L of O2 chronically   Chronic diastolic and systolic HF Coronary artery disease Stopped IVF given hx of fluid overload with CHF Continue [employer] Plavix and aspirin initially, no back on Plavix PRN sublingual nitroglycerin -Back on Lasix   Dyslipidemia TG WNL Continue statin therapy   Complete heart block Status post pacemaker   Constipation CT a/p showed moderate constipation Started bowel regimen, refuses linzess   OSA CPAP   Obesity Lifestyle modifications advised   Wheelchair bound  Discharge Diagnoses:  Active Problems:   Candida esophagitis (HCC)   Hypokalemia   Dyslipidemia   GERD without esophagitis   Coronary artery disease   Heme positive stool   Acute on chronic anemia   Abdominal pain, chronic, epigastric   Gastritis and gastroduodenitis   Nausea without vomiting    Discharge Instructions  Discharge Instructions     Diet - low sodium heart healthy   Complete by: As directed    Discharge instructions   Complete by: As directed    Follow with Primary MD [name], [name], MD/SNF physician  Get CBC, CMP,  checked  by Primary MD next visit.    Activity: As tolerated with Full fall precautions use walker/cane & assistance as needed   Disposition SNF   Diet: Heart Healthy  On your next visit with your primary care physician please Get Medicines reviewed and adjusted.   Please request your Prim.MD to go over all Hospital Tests and Procedure/Radiological results at the follow up, please get all  Hospital records sent to your Prim MD by signing hospital release before you go home.   If you experience worsening of your admission symptoms, develop shortness of breath, life threatening emergency, suicidal or homicidal thoughts you must seek medical attention immediately by calling 911 or calling your MD immediately  if symptoms less severe.  You Must read complete instructions/literature along with all the possible adverse reactions/side effects for all the Medicines you take and that have been prescribed to you. Take any new Medicines after you have completely understood and accpet all the possible adverse reactions/side effects.   Do not drive, operating heavy machinery, perform activities at heights, swimming or participation in water activities or provide baby sitting services if your were admitted for syncope or siezures until you have seen by Primary MD or a Neurologist and advised to do so again.  Do not drive when taking Pain medications.    Do not take more than prescribed Pain, Sleep and Anxiety Medications  Special Instructions: If you have smoked or chewed Tobacco  in the last 2 yrs please stop smoking, stop any regular Alcohol  and or any Recreational drug use.  Wear Seat belts while driving.   Please note  You were cared for by a hospitalist during your hospital stay. If you have any questions about your discharge medications or the care you received while you were in the hospital after you are discharged, you can call the unit and asked to speak with the hospitalist on call if the hospitalist that took care of you is not available. Once you are discharged, your primary care physician will handle any further medical issues. Please note that NO REFILLS for any discharge medications will be authorized once you are discharged, as it is imperative that you return to your primary care physician (or establish a relationship with a primary care physician if you do not have one) for  your aftercare needs so that they can reassess your need for medications and monitor your lab values.   Increase activity slowly   Complete by: As directed    No wound care   Complete by: As directed       Allergies as of 03/24/2022       Reactions   Ivp Dye [iodinated Contrast Media] Shortness Of Breath   No reaction to PO contrast with non-ionic dye.06-25-2014/rsm   Shellfish Allergy Anaphylaxis   Sulfa Antibiotics Shortness Of Breath   Iodine Hives   Benadryl [diphenhydramine] Other (See Comments)   "THIS DRIVES ME CRAZY AND MAKES ME FEEL LIKE I AM DYING"   Lipitor [atorvastatin] Other (See Comments)   Myalgias    Mitigare [colchicine] Nausea And Vomiting   Uloric [febuxostat] Other (See Comments)   Unknown reaction Documented on MAR   Vibra-tab [doxycycline] Other (See Comments)   Unknown reaction Documented on MAR   Keflex [cephalexin] Itching, Rash   Zithromax [azithromycin] Rash        Medication List     STOP taking these medications    aspirin EC 81 MG tablet   sucralfate 1 g tablet Commonly known as: CARAFATE Replaced by: sucralfate 1 GM/10ML suspension       TAKE these medications    acetaminophen 325 MG tablet Commonly known as: TYLENOL Take 2  tablets (650 mg total) by mouth every 6 (six) hours as needed for mild pain (or Fever >/= 101). What changed:  medication strength how much to take when to take this reasons to take this   albuterol 108 (90 Base) MCG/ACT inhaler Commonly known as: VENTOLIN HFA Inhale 2 puffs into the lungs every 6 (six) hours as needed for wheezing or shortness of breath.   albuterol (2.5 MG/3ML) 0.083% nebulizer solution Commonly known as: PROVENTIL Take 3 mLs (2.5 mg total) by nebulization every 6 (six) hours as needed for wheezing or shortness of breath.   allopurinol 100 MG tablet Commonly known as: ZYLOPRIM Take 1 tablet (100 mg total) by mouth daily. Please contact the office to schedule appointment for  additional refills. 1st Attempt. What changed: additional instructions   barrier cream Crea Commonly known as: non-specified Apply 1 Application topically See admin instructions. Apply topically to buttocks every shift and as needed for each incontinence episode.   budesonide-formoterol 160-4.5 MCG/ACT inhaler Commonly known as: SYMBICORT Inhale 1 puff into the lungs 2 (two) times daily.   carboxymethylcellulose 0.5 % Soln Commonly known as: REFRESH PLUS Place 1 drop into both eyes 3 (three) times daily as needed (dry eyes).   clopidogrel 75 MG tablet Commonly known as: PLAVIX Take 1 tablet (75 mg total) by mouth daily.   cyanocobalamin 1000 MCG tablet Commonly known as: VITAMIN B12 Take 1 tablet (1,000 mcg total) by mouth daily.   cycloSPORINE 0.05 % ophthalmic emulsion Commonly known as: RESTASIS Place 1 drop into both eyes 2 (two) times daily.   ferrous sulfate 325 (65 FE) MG tablet Take 1 tablet (325 mg total) by mouth daily with breakfast.   fluconazole 200 MG tablet Commonly known as: DIFLUCAN Take 1 tablet (200 mg total) by mouth daily for 16 days.   fluticasone 50 MCG/ACT nasal spray Commonly known as: FLONASE Place 1 spray into both nostrils daily as needed for allergies.   folic acid 1 MG tablet Commonly known as: FOLVITE Take 1 tablet (1 mg total) by mouth daily.   furosemide 40 MG tablet Commonly known as: LASIX Take 1 tablet (40 mg total) by mouth 2 (two) times daily. 5 day course. (02/26/22-03/02/22) What changed: additional instructions   hydrOXYzine 25 MG tablet Commonly known as: ATARAX Take 25 mg by mouth 2 (two) times daily as needed for itching.   isosorbide mononitrate 30 MG 24 hr tablet Commonly known as: IMDUR Take 2 tablets (60 mg total) by mouth daily. What changed: how much to take   lidocaine 4 % Place 1 patch onto the skin daily as needed (pain).   linaclotide 290 MCG Caps capsule Commonly known as: LINZESS Take 290 mcg by mouth  daily before breakfast.   methocarbamol 500 MG tablet Commonly known as: ROBAXIN Take 500 mg by mouth every 6 (six) hours as needed for muscle spasms.   Mylanta Coat & Cool 1200-270-80 MG/10ML Susp Generic drug: Cal Carb-Mag Hydrox-Simeth Take 30 mLs by mouth 3 (three) times daily as needed (gas pains).   nitroGLYCERIN 0.4 MG SL tablet Commonly known as: NITROSTAT Place 1 tablet (0.4 mg total) under the tongue every 5 (five) minutes as needed for chest pain. What changed: when to take this   ondansetron 4 MG tablet Commonly known as: ZOFRAN Take 4 mg by mouth daily as needed for nausea or vomiting.   oxyCODONE 5 MG immediate release tablet Commonly known as: Oxy IR/ROXICODONE Take 1 tablet (5 mg total) by mouth every 6 (  six) hours as needed for severe pain.   OXYGEN Inhale 2-3 L/min into the lungs continuous.   oxymetazoline 0.05 % nasal spray Commonly known as: AFRIN Place 1 spray into both nostrils 2 (two) times daily as needed for congestion.   pantoprazole 40 MG tablet Commonly known as: PROTONIX Take 1 tablet (40 mg total) by mouth 2 (two) times daily.   polyethylene glycol powder 17 GM/SCOOP powder Commonly known as: GLYCOLAX/MIRALAX Take 17 g by mouth daily as needed (constipation).   potassium chloride SA 20 MEQ tablet Commonly known as: KLOR-CON M Take 1 tablet (20 mEq total) by mouth 2 (two) times daily.   PREPARATION H RAPID RELIEF RE Place 1 spray rectally every 6 (six) hours as needed (hemorrhoids).   rosuvastatin 20 MG tablet Commonly known as: CRESTOR Take 1 tablet (20 mg total) by mouth every evening. NEED OV. What changed: additional instructions   simethicone 80 MG chewable tablet Commonly known as: MYLICON Chew 80 mg by mouth 4 (four) times daily as needed for flatulence.   sucralfate 1 GM/10ML suspension Commonly known as: CARAFATE Take 10 mLs (1 g total) by mouth 4 (four) times daily -  with meals and at bedtime. Replaces: sucralfate 1 g  tablet        Contact information for after-discharge care     Destination     [facility] Preferred SNF .   Service: Skilled Nursing Contact information: [id number] [address] [phone]                    Allergies  Allergen Reactions   Ivp Dye [Iodinated Contrast Media] Shortness Of Breath    No reaction to PO contrast with non-ionic dye.06-25-2014/rsm   Shellfish Allergy Anaphylaxis   Sulfa Antibiotics Shortness Of Breath   Iodine Hives   Benadryl [Diphenhydramine] Other (See Comments)    "THIS DRIVES ME CRAZY AND MAKES ME FEEL LIKE I AM DYING"    Lipitor [Atorvastatin] Other (See Comments)    Myalgias    Mitigare [Colchicine] Nausea And Vomiting   Uloric [Febuxostat] Other (See Comments)    Unknown reaction Documented on MAR   Vibra-Tab [Doxycycline] Other (See Comments)    Unknown reaction Documented on MAR   Keflex [Cephalexin] Itching and Rash   Zithromax [Azithromycin] Rash    Consultations: GI   Procedures/Studies: US Abdomen Limited RUQ (LIVER/GB)  Result Date: 03/17/2022 CLINICAL DATA:  History of prior cholecystectomy. Acute pancreatitis. EXAM: ULTRASOUND ABDOMEN LIMITED RIGHT UPPER QUADRANT COMPARISON:  None Available. FINDINGS: Gallbladder: Over gallbladder is surgically absent. Common bile duct: Diameter: 9.3 mm Liver: No focal lesion identified. Diffusely increased echogenicity of the liver parenchyma is noted. Portal vein is patent on color Doppler imaging with normal direction of blood flow towards the liver. Other: Of incidental note is the presence of a 3.7 cm right renal cyst. IMPRESSION: 1. Findings consistent with history of prior cholecystectomy. 2. Hepatic steatosis without focal liver lesions. 3. Right renal cyst which spine to the cystic lesion seen within the right kidney on the prior abdomen pelvis CT (March 17, 2022). Correlation with 6 week follow-up contrast-enhanced  abdomen and pelvis CT is recommended for improved characterization and to determine stability. Electronically Signed   By: Virgina Norfolk M.D.   On: 03/17/2022 21:20   CT ABDOMEN PELVIS WO CONTRAST  Result Date: 03/17/2022 CLINICAL DATA:  Abdominal pain for 3 days. EXAM: CT ABDOMEN AND PELVIS WITHOUT CONTRAST TECHNIQUE: Multidetector CT imaging  of the abdomen and pelvis was performed following the standard protocol without IV contrast. RADIATION DOSE REDUCTION: This exam was performed according to the departmental dose-optimization program which includes automated exposure control, adjustment of the mA and/or kV according to patient size and/or use of iterative reconstruction technique. COMPARISON:  CT, 09/09/2021 and 04/21/2021. FINDINGS: Lower chest: 4 mm nodule, right middle lobe, image 4, series 8. 5 mm nodule, right lower lobe, image 12/8. Tiny nodule, right lower lobe, image 19/8. These nodules are stable from 09/09/2021. Mild dependent lower lobe atelectasis. No acute findings. Hepatobiliary: Of several small low-attenuation liver lesions, largest at the dome of segment 7, 1 cm size, nonspecific, likely cysts. No other liver abnormality. Gallbladder surgically absent. No bile duct dilation. Pancreas: Unremarkable. No pancreatic ductal dilatation or surrounding inflammatory changes. Spleen: Normal in size without focal abnormality. Adrenals/Urinary Tract: No adrenal masses. Mild bilateral renal cortical thinning. Isoattenuating right renal masses, 1 protruding from the posterolateral upper pole, 1.4 cm, the other protruding medially from the lower pole, 2 cm. Exophytic small cyst from the posterolateral midpole the left kidney, 1.3 cm. Right renal masses have both increased in size from the prior CT. Left renal cyst is without significant change. No new masses, no stones and no hydronephrosis. Normal ureters. Bladder mostly decompressed, otherwise unremarkable. Stomach/Bowel: Stomach mostly decompressed,  otherwise unremarkable. Small bowel and colon are normal in caliber. No wall thickening. No inflammation. Scattered colonic diverticula most numerous on the left. Rectum moderately distended with stool. Vascular/Lymphatic: Dense aortic atherosclerotic calcifications extending to the branch vessels. No aneurysm. No enlarged lymph nodes. Reproductive: Uterus and bilateral adnexa are unremarkable. Other: No ascites. Musculoskeletal: No fracture or acute finding. Well-positioned left total hip arthroplasty. Partly imaged posterior fusion hardware from a thoracic spine fusion. Thoracic spine hardware is new since the prior study. Left hip arthroplasty replaces compression screw and intramedullary rod seen previously. IMPRESSION: 1. No acute findings within the abdomen or pelvis. 2. 2 isoattenuating right renal masses have increased in size from previous CT. Although these may still reflect complicated/hemorrhagic cysts, recommend further assessment with nonemergent renal ultrasound. 3. Small lung base nodules as detailed stable from the CT dated 09/09/2021, and from exam from 2019, benign with no follow-up recommended. 4. Aortic atherosclerosis. 5. Rectum moderately distended with stool. Electronically Signed   By: Lajean Manes M.D.   On: 03/17/2022 18:16   UE VENOUS DUPLEX (7am - 7pm)  Result Date: 03/17/2022 UPPER VENOUS STUDY  Patient Name:  CORLENE WISECARVER  Date of Exam:   03/17/2022 Medical Rec #: [id number]          Accession #:    [id number] Date of Birth: 11/12/1945           Patient Gender: F Patient Age:   25 years Exam Location:  [facility] Procedure:      VAS [place] UPPER EXTREMITY VENOUS DUPLEX Referring Phys: Herbie Baltimore LOCKWOOD --------------------------------------------------------------------------------  Indications: Pain, Edema, and numbness Limitations: Body habitus,PM placement, and poor ultrasound/tissue interface. Comparison Study: No previous UEV exams Performing Technologist: Jody Hill RVT,  RDMS  Examination Guidelines: A complete evaluation includes B-mode imaging, spectral Doppler, color Doppler, and power Doppler as needed of all accessible portions of each vessel. Bilateral testing is considered an integral part of a complete examination. Limited examinations for reoccurring indications may be performed as noted.  Right Findings: +----------+------------+---------+-----------+----------+--------------------+ RIGHT     CompressiblePhasicitySpontaneousProperties      Summary        +----------+------------+---------+-----------+----------+--------------------+ IJV  Full       Yes       Yes                                   +----------+------------+---------+-----------+----------+--------------------+ Subclavian               Yes       Yes                   patent by                                                              color/doppler     +----------+------------+---------+-----------+----------+--------------------+ Axillary      Full       Yes       Yes                                   +----------+------------+---------+-----------+----------+--------------------+ Brachial      Full       Yes       Yes                                   +----------+------------+---------+-----------+----------+--------------------+ Radial        Full                                                       +----------+------------+---------+-----------+----------+--------------------+ Cephalic      Full                                    patent by color    +----------+------------+---------+-----------+----------+--------------------+ Basilic       Full       Yes       Yes                                   +----------+------------+---------+-----------+----------+--------------------+ Limited visualization of ulnar veins - patent by color only  Left Findings: +----------+------------+---------+-----------+----------+--------------------+  LEFT      CompressiblePhasicitySpontaneousProperties      Summary        +----------+------------+---------+-----------+----------+--------------------+ Subclavian               Yes       Yes                   patent by                                                              color/doppler     +----------+------------+---------+-----------+----------+--------------------+  Summary:  Right: No evidence of deep vein thrombosis in the  upper extremity. No evidence of superficial vein thrombosis in the upper extremity.  Left: No evidence of thrombosis in the subclavian.  *See table(s) above for measurements and observations.  Diagnosing physician: Servando Snare MD Electronically signed by Servando Snare MD on 03/17/2022 at 5:37:48 PM.    Final    DG Chest Port 1 View  Result Date: 03/04/2022 CLINICAL DATA:  77 year old female with shortness of breath. EXAM: PORTABLE CHEST 1 VIEW COMPARISON:  Portable chest 02/27/2022 and earlier. FINDINGS: Portable AP semi upright view at 0413 hours. Stable extensive postoperative changes including CABG, sternotomy, multilevel posterior thoracic fusion. Stable right chest AICD. Stable lung volumes and mediastinal contours with Calcified aortic atherosclerosis., tortuous descending thoracic aorta, and a degree of cardiomegaly. Hypo ventilation at the left lung base is not significantly changed, including some platelike opacity at the level of the hilum. Pulmonary vascularity appears stable to improved, no edema. No pneumothorax. No consolidation. Cervical ACDF also redemonstrated. Stable visualized osseous structures. Paucity of bowel gas in the upper abdomen. IMPRESSION: 1. Stable since 02/27/2022 with hypo ventilation at the left lung base, underlying cardiomegaly. 2. No pulmonary edema or new cardiopulmonary abnormality. Electronically Signed   By: Genevie Ann M.D.   On: 03/04/2022 04:33   VAS [place] LOWER EXTREMITY VENOUS (DVT)  Result Date: 03/01/2022  Lower  Venous DVT Study Patient Name:  ZIYONA GAGEL  Date of Exam:   02/27/2022 Medical Rec #: [id number]          Accession #:    [id number] Date of Birth: 12-Jan-1946           Patient Gender: F Patient Age:   37 years Exam Location:  [facility] Procedure:      VAS [place] LOWER EXTREMITY VENOUS (DVT) Referring Phys: Georgina Snell --------------------------------------------------------------------------------  Indications: Left leg swelling, discoloration.  Comparison Study: 08-05-2021 Most recent prior lower extremity venous study was                   negative for DVT. Performing Technologist: Darlin Coco RDMS, RVT  Examination Guidelines: A complete evaluation includes B-mode imaging, spectral Doppler, color Doppler, and power Doppler as needed of all accessible portions of each vessel. Bilateral testing is considered an integral part of a complete examination. Limited examinations for reoccurring indications may be performed as noted. The reflux portion of the exam is performed with the patient in reverse Trendelenburg.  +-----+---------------+---------+-----------+----------+--------------+ RIGHTCompressibilityPhasicitySpontaneityPropertiesThrombus Aging +-----+---------------+---------+-----------+----------+--------------+ CFV  Full           Yes      Yes                                 +-----+---------------+---------+-----------+----------+--------------+   +---------+---------------+---------+-----------+----------+--------------+ LEFT     CompressibilityPhasicitySpontaneityPropertiesThrombus Aging +---------+---------------+---------+-----------+----------+--------------+ CFV      Full           Yes      Yes                                 +---------+---------------+---------+-----------+----------+--------------+ SFJ      Full                                                        +---------+---------------+---------+-----------+----------+--------------+  FV Prox   Full                                                        +---------+---------------+---------+-----------+----------+--------------+ FV Mid   Full                                                        +---------+---------------+---------+-----------+----------+--------------+ FV DistalFull                                                        +---------+---------------+---------+-----------+----------+--------------+ PFV      Full                                                        +---------+---------------+---------+-----------+----------+--------------+ POP      Full           Yes      Yes                                 +---------+---------------+---------+-----------+----------+--------------+ PTV      Full                                                        +---------+---------------+---------+-----------+----------+--------------+ PERO     Full                                                        +---------+---------------+---------+-----------+----------+--------------+    Summary: RIGHT: - No evidence of common femoral vein obstruction.  LEFT: - There is no evidence of deep vein thrombosis in the lower extremity.  - No cystic structure found in the popliteal fossa.  *See table(s) above for measurements and observations. Electronically signed by Harold Barban MD on 03/01/2022 at 10:19:48 AM.    Final    DG Chest Portable 1 View  Result Date: 02/27/2022 CLINICAL DATA:  Hemoptysis.  Nosebleed EXAM: PORTABLE CHEST 1 VIEW COMPARISON:  Chest x-ray 02/09/2022 and older FINDINGS: Sternal wires. Right upper chest defibrillator. Fixation hardware along the thoracolumbar spine. Enlarged cardiopericardial silhouette with calcified tortuous aorta. Mild interstitial changes with some prominent vasculature. Mild left midlung scar or atelectasis. No pneumothorax or effusion. Fixation hardware along the lower cervical spine as well. Films are under penetrated.  Osteopenia and degenerative changes. IMPRESSION: Enlarged cardiopericardial silhouette with central vascular congestion. Postop chest. Electronically Signed   By: Jill Side M.D.   On: 02/27/2022 16:10  Subjective:  No significant events overnight, she had a good night sleep, reported odynophagia much improved Discharge Exam: Vitals:   03/24/22 0739 03/24/22 0826  BP:  136/61  Pulse:  79  Resp:  16  Temp:  98.4 F (36.9 C)  SpO2: 95% 99%   Vitals:   03/23/22 0702 03/23/22 1945 03/24/22 0739 03/24/22 0826  BP: (!) 104/58 (!) 133/46  136/61  Pulse: 72 85  79  Resp: 17 16  16  $ Temp: 98 F (36.7 C) 98.5 F (36.9 C)  98.4 F (36.9 C)  TempSrc: Oral Oral  Oral  SpO2: 97% 95% 95% 99%  Weight:      Height:        General: Pt is alert, awake, not in acute distress Cardiovascular: RRR, S1/S2 +, no rubs, no gallops Respiratory: CTA bilaterally, no wheezing, no rhonchi Abdominal: Soft, NT, ND, bowel sounds + Extremities: no edema, no cyanosis    The results of significant diagnostics from this hospitalization (including imaging, microbiology, ancillary and laboratory) are listed below for reference.     Microbiology: No results found for this or any previous visit (from the past 240 hour(s)).   Labs: BNP (last 3 results) Recent Labs    09/09/21 1438 11/01/21 1214 03/04/22 0418  BNP 569.7* 415.3* [id number]*   Basic Metabolic Panel: Recent Labs  Lab 03/19/22 0448 03/20/22 0349 03/21/22 0350 03/22/22 0221 03/23/22 0611  NA 138 136 136 136 136  K 3.4* 3.9 3.9 3.8 3.6  CL 100 95* 95* 94* 92*  CO2 29 34* 30 32 32  GLUCOSE 101* 115* 129* 153* 145*  BUN 38* 31* 28* 29* 31*  CREATININE 1.93* 1.76* 1.73* 2.17* 2.01*  CALCIUM 8.9 9.0 8.9 8.8* 8.9   Liver Function Tests: Recent Labs  Lab 03/17/22 1545  AST 25  ALT 18  ALKPHOS 173*  BILITOT 0.5  PROT 6.1*  ALBUMIN 2.6*   Recent Labs  Lab 03/17/22 1545 03/18/22 0538 03/19/22 0448 03/20/22 0349  03/21/22 0350  LIPASE 1,308* 529* 382* 314* 323*   No results for input(s): "AMMONIA" in the last 168 hours. CBC: Recent Labs  Lab 03/17/22 1545 03/18/22 0538 03/19/22 0448 03/19/22 1903 03/20/22 0349 03/21/22 0350 03/22/22 0221 03/23/22 0611 03/24/22 0629  WBC 8.0   < > 4.9   < > 6.6 7.2 8.2 8.2 5.2  NEUTROABS 5.7  --  2.8  --   --   --   --   --   --   HGB 7.9*   < > 7.4*   < > 9.4* 9.7* 8.7* 9.3* 9.0*  HCT 26.7*   < > 24.3*   < > 29.5* 31.6* 28.5* 30.5* 28.7*  MCV 97.4   < > 96.0   < > 91.0 92.9 94.1 94.4 93.5  PLT 180   < > 134*   < > 143* 146* 141* 129* 124*   < > = values in this interval not displayed.   Cardiac Enzymes: No results for input(s): "CKTOTAL", "CKMB", "CKMBINDEX", "TROPONINI" in the last 168 hours. BNP: Invalid input(s): "POCBNP" CBG: Recent Labs  Lab 03/23/22 1708 03/23/22 2138 03/24/22 0053 03/24/22 0425 03/24/22 1211  GLUCAP 119* 126* 81 80 157*   D-Dimer No results for input(s): "DDIMER" in the last 72 hours. Hgb A1c No results for input(s): "HGBA1C" in the last 72 hours. Lipid Profile No results for input(s): "CHOL", "HDL", "LDLCALC", "TRIG", "CHOLHDL", "LDLDIRECT" in the last 72 hours. Thyroid function studies No results for input(s): "TSH", "  T4TOTAL", "T3FREE", "THYROIDAB" in the last 72 hours.  Invalid input(s): "FREET3" Anemia work up No results for input(s): "VITAMINB12", "FOLATE", "FERRITIN", "TIBC", "IRON", "RETICCTPCT" in the last 72 hours. Urinalysis    Component Value Date/Time   COLORURINE YELLOW 03/17/2022 1530   APPEARANCEUR CLEAR 03/17/2022 1530   LABSPEC 1.016 03/17/2022 1530   PHURINE 5.0 03/17/2022 1530   GLUCOSEU NEGATIVE 03/17/2022 1530   HGBUR NEGATIVE 03/17/2022 1530   BILIRUBINUR NEGATIVE 03/17/2022 1530   KETONESUR NEGATIVE 03/17/2022 1530   PROTEINUR 30 (A) 03/17/2022 1530   UROBILINOGEN 0.2 12/06/2014 1531   NITRITE NEGATIVE 03/17/2022 1530   LEUKOCYTESUR TRACE (A) 03/17/2022 1530   Sepsis  Labs Recent Labs  Lab 03/21/22 0350 03/22/22 0221 03/23/22 0611 03/24/22 0629  WBC 7.2 8.2 8.2 5.2   Microbiology No results found for this or any previous visit (from the past 240 hour(s)).   Time coordinating discharge: Over 30 minutes  SIGNED:   Phillips Climes, MD  [facility] 03/24/2022, 12:25 PM Pager   If 7PM-7AM, please contact night-coverage [email or web address] Password [email or web address]

## 2022-03-24 NOTE — Discharge Instructions (Addendum)
Follow with Primary MD [name], [name], MD/SNF physician  Get CBC, CMP,  checked  by Primary MD next visit.    Activity: As tolerated with Full fall precautions use walker/cane & assistance as needed   Disposition SNF   Diet: Heart Healthy    On your next visit with your primary care physician please Get Medicines reviewed and adjusted.   Please request your Prim.MD to go over all Hospital Tests and Procedure/Radiological results at the follow up, please get all Hospital records sent to your Prim MD by signing hospital release before you go home.   If you experience worsening of your admission symptoms, develop shortness of breath, life threatening emergency, suicidal or homicidal thoughts you must seek medical attention immediately by calling 911 or calling your MD immediately  if symptoms less severe.  You Must read complete instructions/literature along with all the possible adverse reactions/side effects for all the Medicines you take and that have been prescribed to you. Take any new Medicines after you have completely understood and accpet all the possible adverse reactions/side effects.   Do not drive, operating heavy machinery, perform activities at heights, swimming or participation in water activities or provide baby sitting services if your were admitted for syncope or siezures until you have seen by Primary MD or a Neurologist and advised to do so again.  Do not drive when taking Pain medications.    Do not take more than prescribed Pain, Sleep and Anxiety Medications  Special Instructions: If you have smoked or chewed Tobacco  in the last 2 yrs please stop smoking, stop any regular Alcohol  and or any Recreational drug use.  Wear Seat belts while driving.   Please note  You were cared for by a hospitalist during your hospital stay. If you have any questions about your discharge medications or the care you received while you were in the hospital after you are  discharged, you can call the unit and asked to speak with the hospitalist on call if the hospitalist that took care of you is not available. Once you are discharged, your primary care physician will handle any further medical issues. Please note that NO REFILLS for any discharge medications will be authorized once you are discharged, as it is imperative that you return to your primary care physician (or establish a relationship with a primary care physician if you do not have one) for your aftercare needs so that they can reassess your need for medications and monitor your lab values.

## 2022-03-24 NOTE — TOC Transition Note (Signed)
Transition of Care [facility]) - CM/SW Discharge Note   Patient Details  Name: [name] MRN: [id number] Date of Birth: 1945-12-25  Transition of Care [facility] CM/SW Contact:  Curlene Labrum, RN Phone Number: 03/24/2022, 1:42 PM   Clinical Narrative:    CM spoke with Perrin Smack, CM at [facility] the patient's insurance authorization is still pending for SNF placement at the facility - but [facility] able to accept the patient back to the facility today - considering patient was using her Medicaid insurance prior to admission to the hospital.  Attending MD is aware and discharge orders and summary are placed.  [facility] was called for ambulance transport to the facility.  Bedside nursing - please call report to [facility] to [phone].   Final next level of care: Skilled Nursing Facility Barriers to Discharge: Insurance Authorization   Patient Goals and CMS Choice CMS Medicare.gov Compare Post Acute Care list provided to:: Patient Choice offered to / list presented to : Patient  Discharge Placement                         Discharge Plan and Services Additional resources added to the After Visit Summary for     Discharge Planning Services: CM Consult Post Acute Care Choice: [place]                               Social Determinants of Health (SDOH) Interventions SDOH Screenings   Food Insecurity: No Food Insecurity (02/06/2022)  Recent Concern: [place] Present (11/19/2021)  Housing: Low Risk  (02/06/2022)  Transportation Needs: Unmet Transportation Needs (02/06/2022)  Utilities: Not At Risk (02/06/2022)  Depression (PHQ2-9): Low Risk  (11/19/2021)  Financial Resource Strain: High Risk (12/18/2016)  Physical Activity: Inactive (12/18/2016)  Social Connections: Moderately Integrated (02/05/2021)  Stress: Stress Concern Present (12/18/2016)  Tobacco Use: Medium Risk (03/20/2022)     Readmission Risk Interventions     03/21/2022    2:22 PM 01/10/2021    9:47 AM  Readmission Risk Prevention Plan  Transportation Screening Complete Complete  PCP or Specialist Appt within 3-5 Days  Complete  HRI or [place]  Complete  Social Work Consult for [place] Planning/Counseling  Complete  Palliative Care Screening  Not Applicable  Medication Review [employer] photographer) Complete Complete  PCP or Specialist appointment within 3-5 days of discharge Complete   HRI or [place] Complete   SW Recovery Care/Counseling Consult Complete   Palliative Care Screening Complete   Skilled Nursing Facility Complete

## 2022-03-24 NOTE — Progress Notes (Signed)
Mobility Specialist - Progress Note   03/24/22 1422  Mobility  Activity Repositioned in chair (seated exercise)  Level of Assistance Minimal assist, patient does 75% or more  Range of Motion/Exercises Right leg;Left leg;Active  Mobility Referral Yes  $Mobility charge 1 Mobility   Pt received in chair declining ambulation but agreeable to seated exercise. Pt c/o slight stiffness and pain during session. Pt eager to be discharged as well. Pt left in chair with all needs met.   Franki Monte  Mobility Specialist Please contact via Solicitor or Rehab office at [phone]

## 2022-03-25 DIAGNOSIS — U071 COVID-19: Secondary | ICD-10-CM | POA: Diagnosis not present

## 2022-03-25 DIAGNOSIS — M84359A Stress fracture, hip, unspecified, initial encounter for fracture: Secondary | ICD-10-CM | POA: Diagnosis not present

## 2022-03-25 DIAGNOSIS — J9611 Chronic respiratory failure with hypoxia: Secondary | ICD-10-CM | POA: Diagnosis not present

## 2022-03-25 DIAGNOSIS — B3781 Candidal esophagitis: Secondary | ICD-10-CM | POA: Diagnosis not present

## 2022-03-25 DIAGNOSIS — I5033 Acute on chronic diastolic (congestive) heart failure: Secondary | ICD-10-CM | POA: Diagnosis not present

## 2022-03-26 DIAGNOSIS — M84359A Stress fracture, hip, unspecified, initial encounter for fracture: Secondary | ICD-10-CM | POA: Diagnosis not present

## 2022-03-26 DIAGNOSIS — I5033 Acute on chronic diastolic (congestive) heart failure: Secondary | ICD-10-CM | POA: Diagnosis not present

## 2022-03-27 ENCOUNTER — Other Ambulatory Visit: Payer: Self-pay | Admitting: *Deleted

## 2022-03-27 ENCOUNTER — Encounter (HOSPITAL_COMMUNITY): Payer: 59

## 2022-03-27 NOTE — Patient Outreach (Signed)
[place] Coordinator follow up.   Confirmed with Cindie Crumbly social worker, Mrs. Maltos transitioned to long term care.   No identifiable care coordination services.  Marthenia Rolling, MSN, RN,BSN [place] [facility] Coordinator [phone] (Direct dial)

## 2022-03-28 DIAGNOSIS — J9611 Chronic respiratory failure with hypoxia: Secondary | ICD-10-CM | POA: Diagnosis not present

## 2022-03-28 DIAGNOSIS — B3781 Candidal esophagitis: Secondary | ICD-10-CM | POA: Diagnosis not present

## 2022-03-30 IMAGING — DX DG KNEE 1-2V PORT*L*
1 series · 3 of 3 positions shown · non-contrast
Comparison: None.

CLINICAL DATA: Fall, pain

EXAM:
PORTABLE LEFT KNEE - 1-2 VIEW

[Series 1: knee · 0.14mm/px · 3 of 3 slices shown]
[im 1/3]
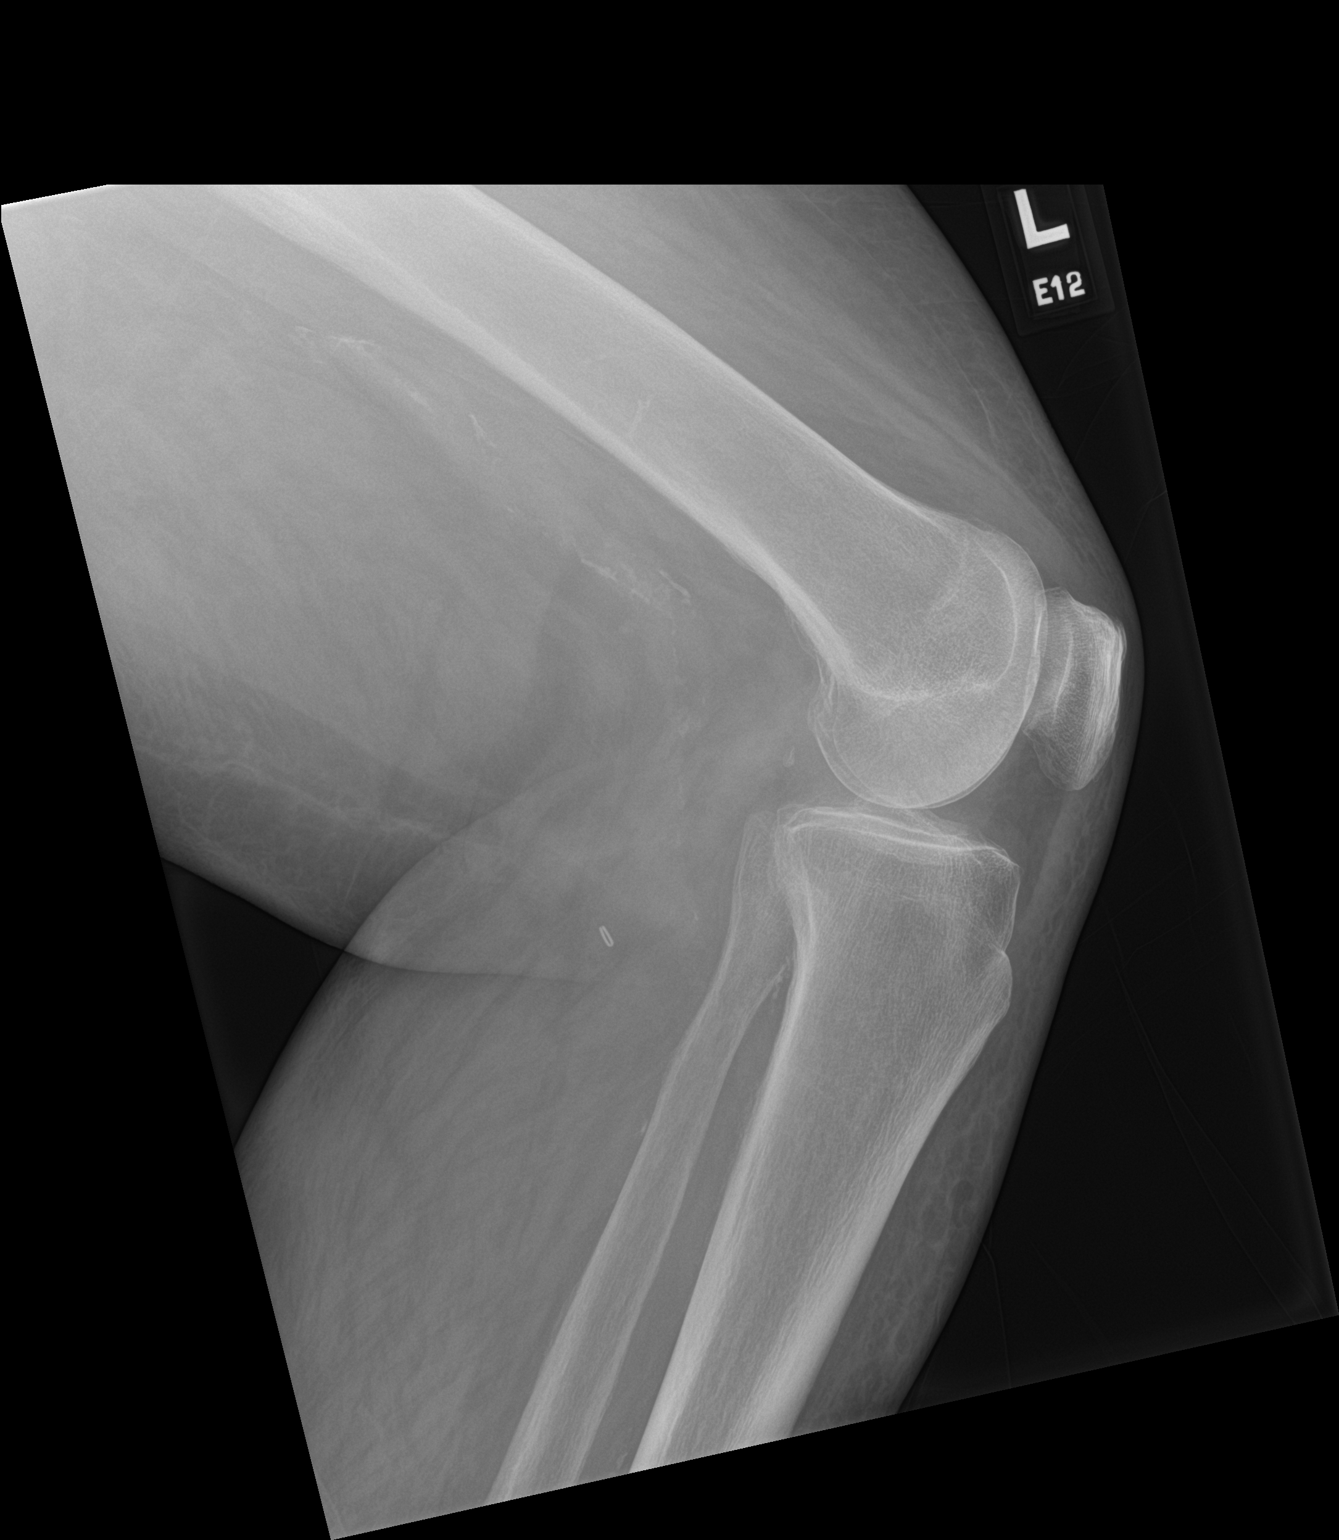
[im 2/3]
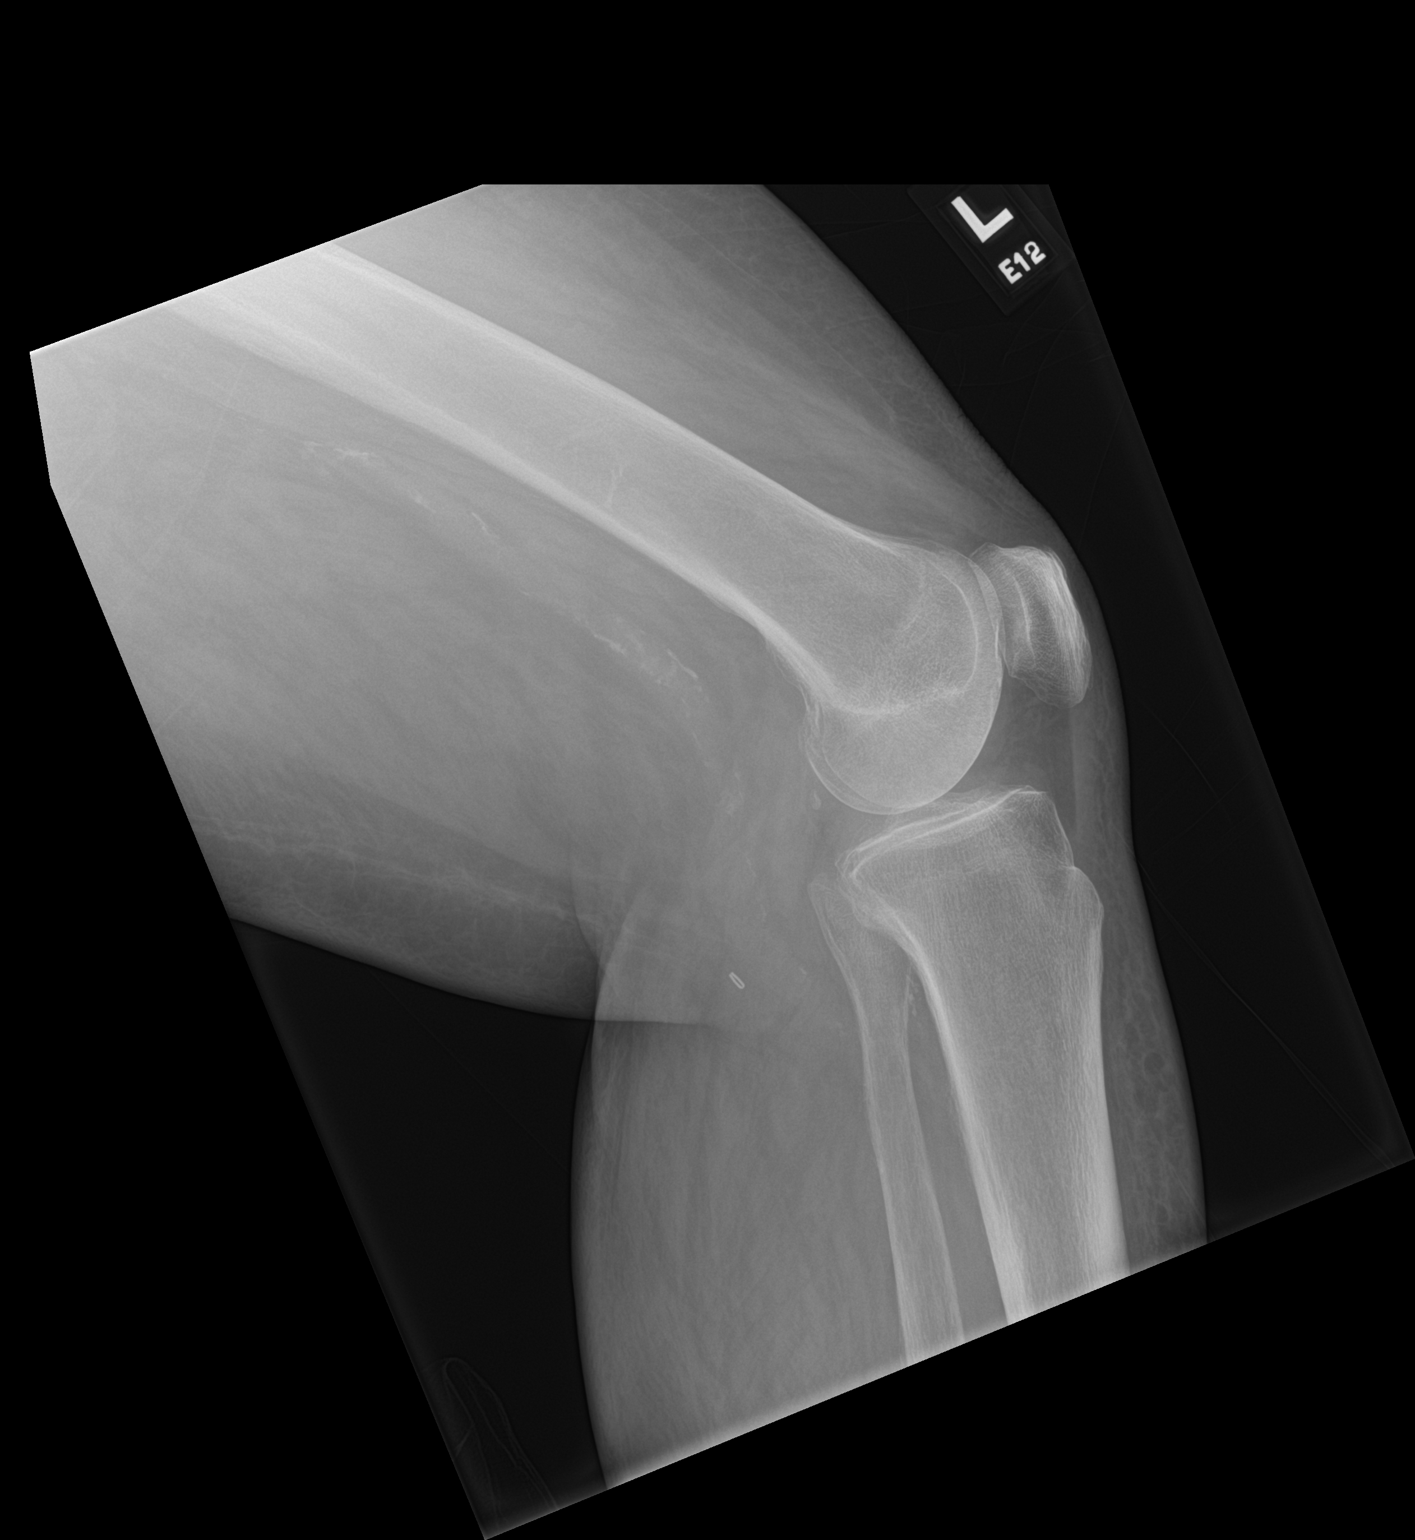
[im 3/3]
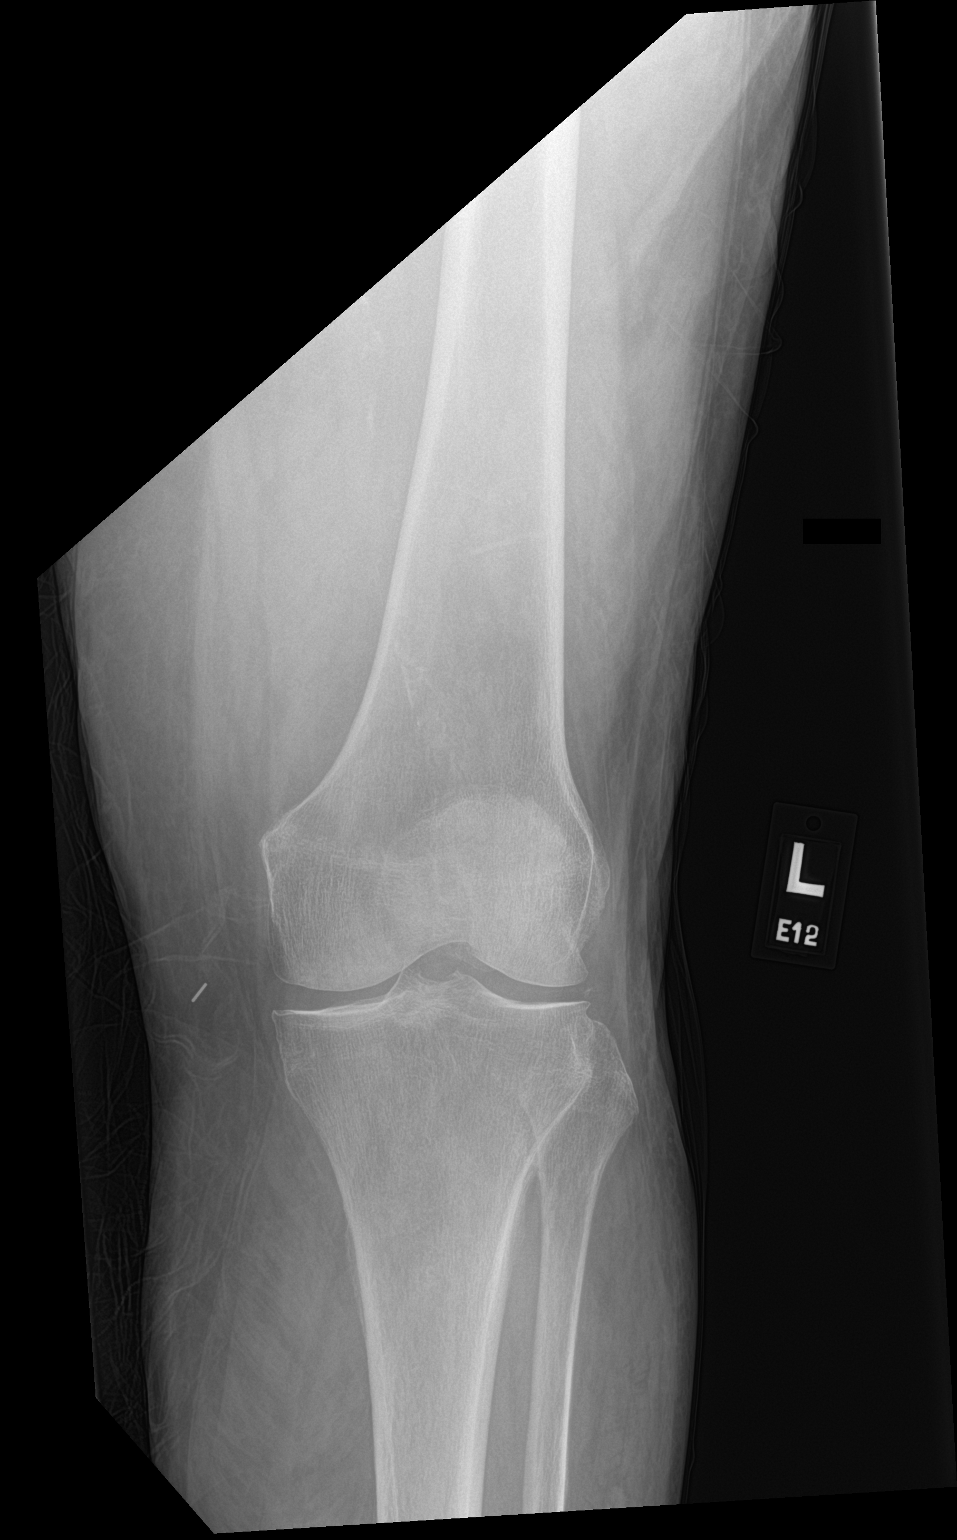

[3 of 3 positions shown; findings below may reference images not displayed]

FINDINGS: There is no acute fracture or dislocation. Knee alignment is normal.
There is mild medial and lateral compartment joint space narrowing
with minimal osteophytosis. A surgical clip is noted in the
posterior soft tissues. There is no effusion.
IMPRESSION: No acute fracture or dislocation.

## 2022-03-30 IMAGING — CR DG HIP (WITH OR WITHOUT PELVIS) 2-3V*L*
3 series · 3 of 3 positions shown · non-contrast
Comparison: None.

CLINICAL DATA: Fall today.  Left hip pain.

EXAM:
DG HIP (WITH OR WITHOUT PELVIS) 2-3V LEFT

[pelvis ap]
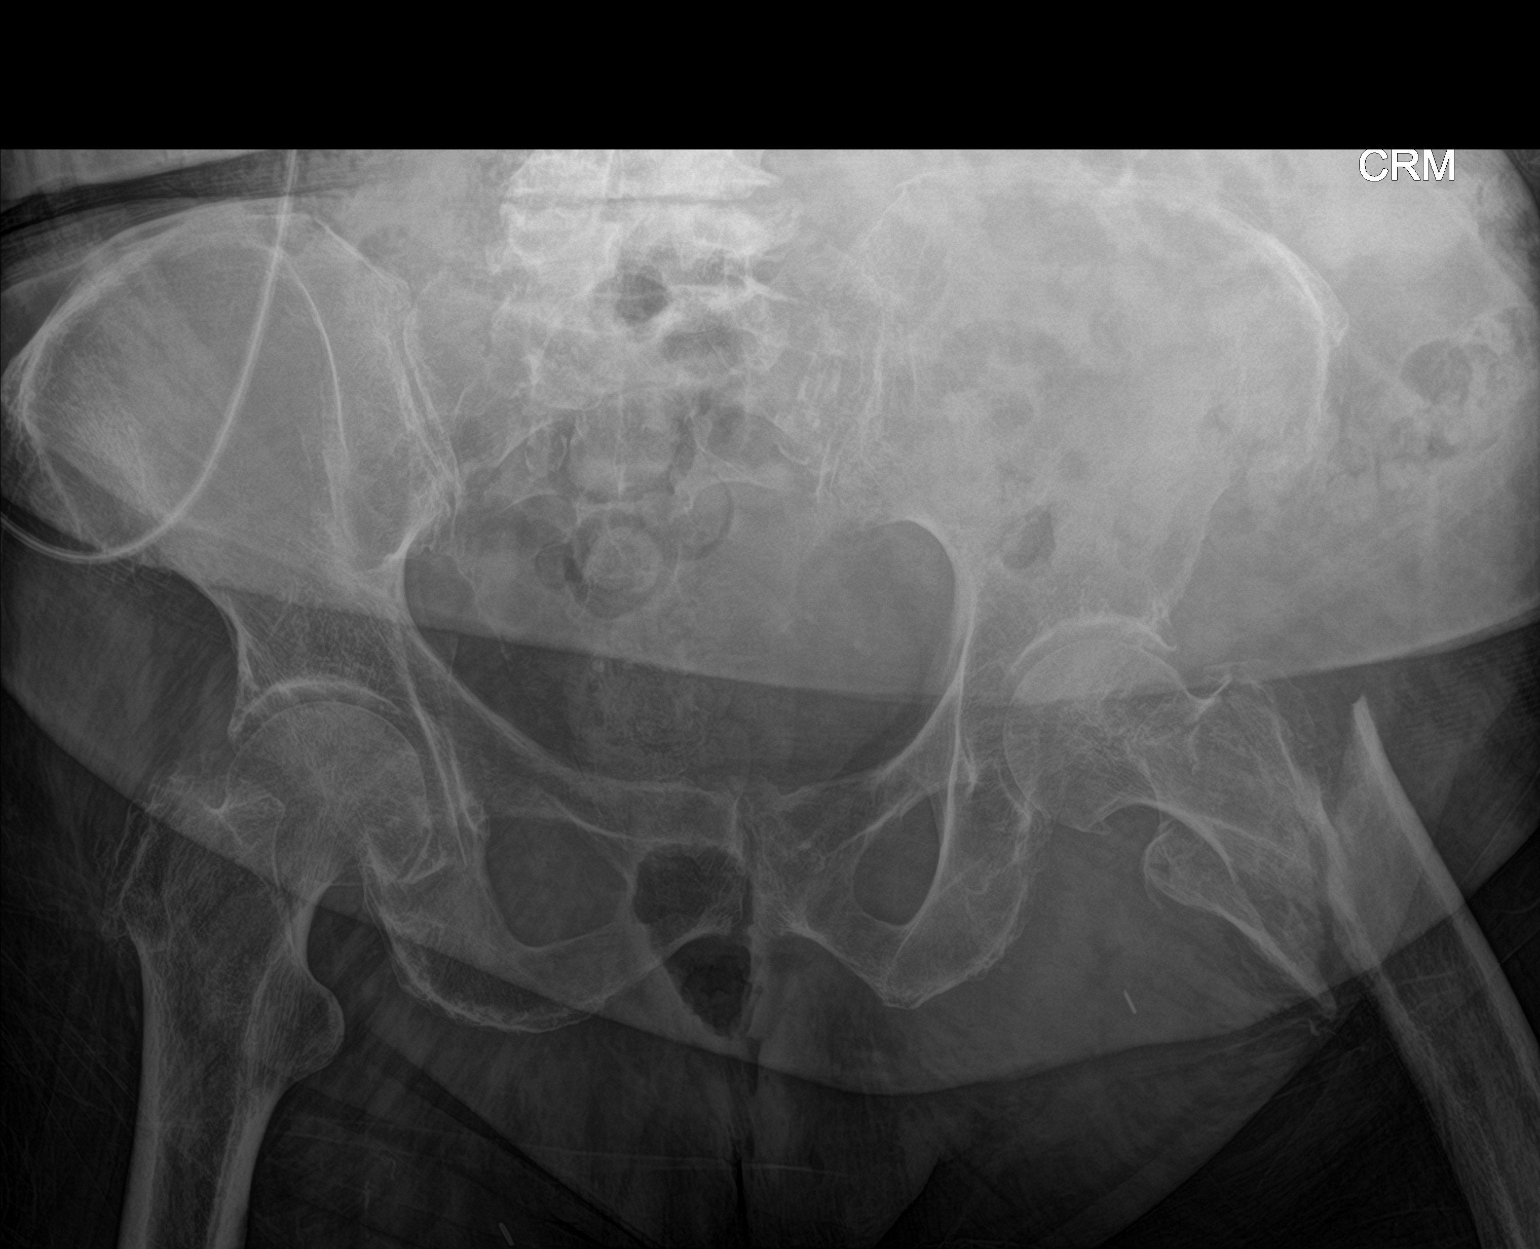

[hip ap]
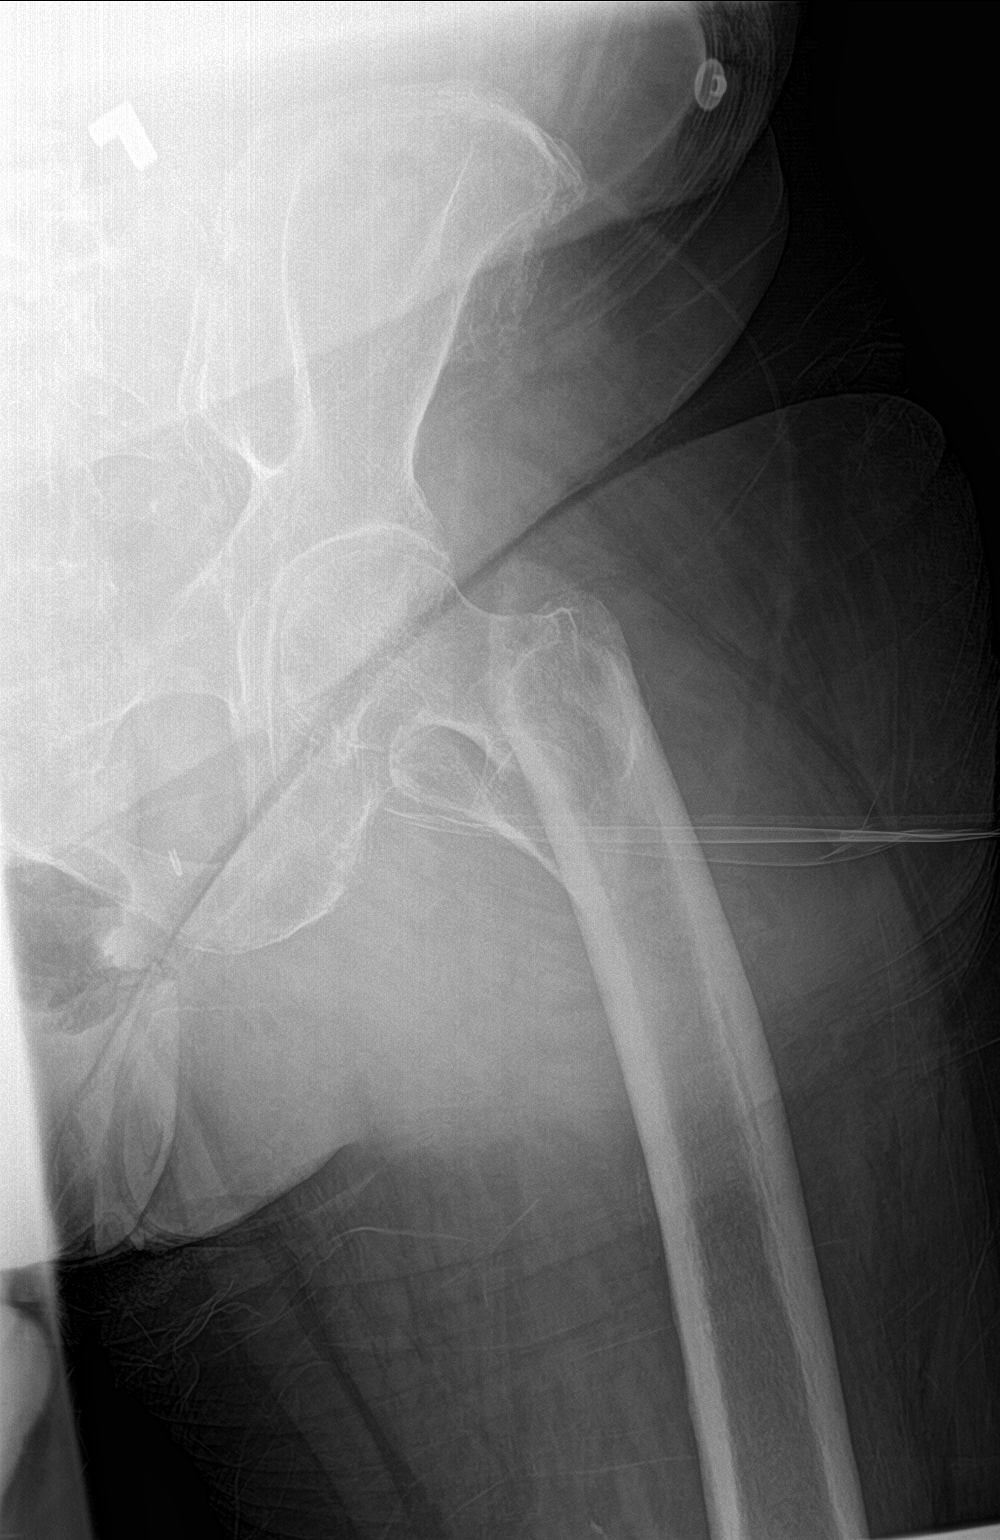

[hip lat]
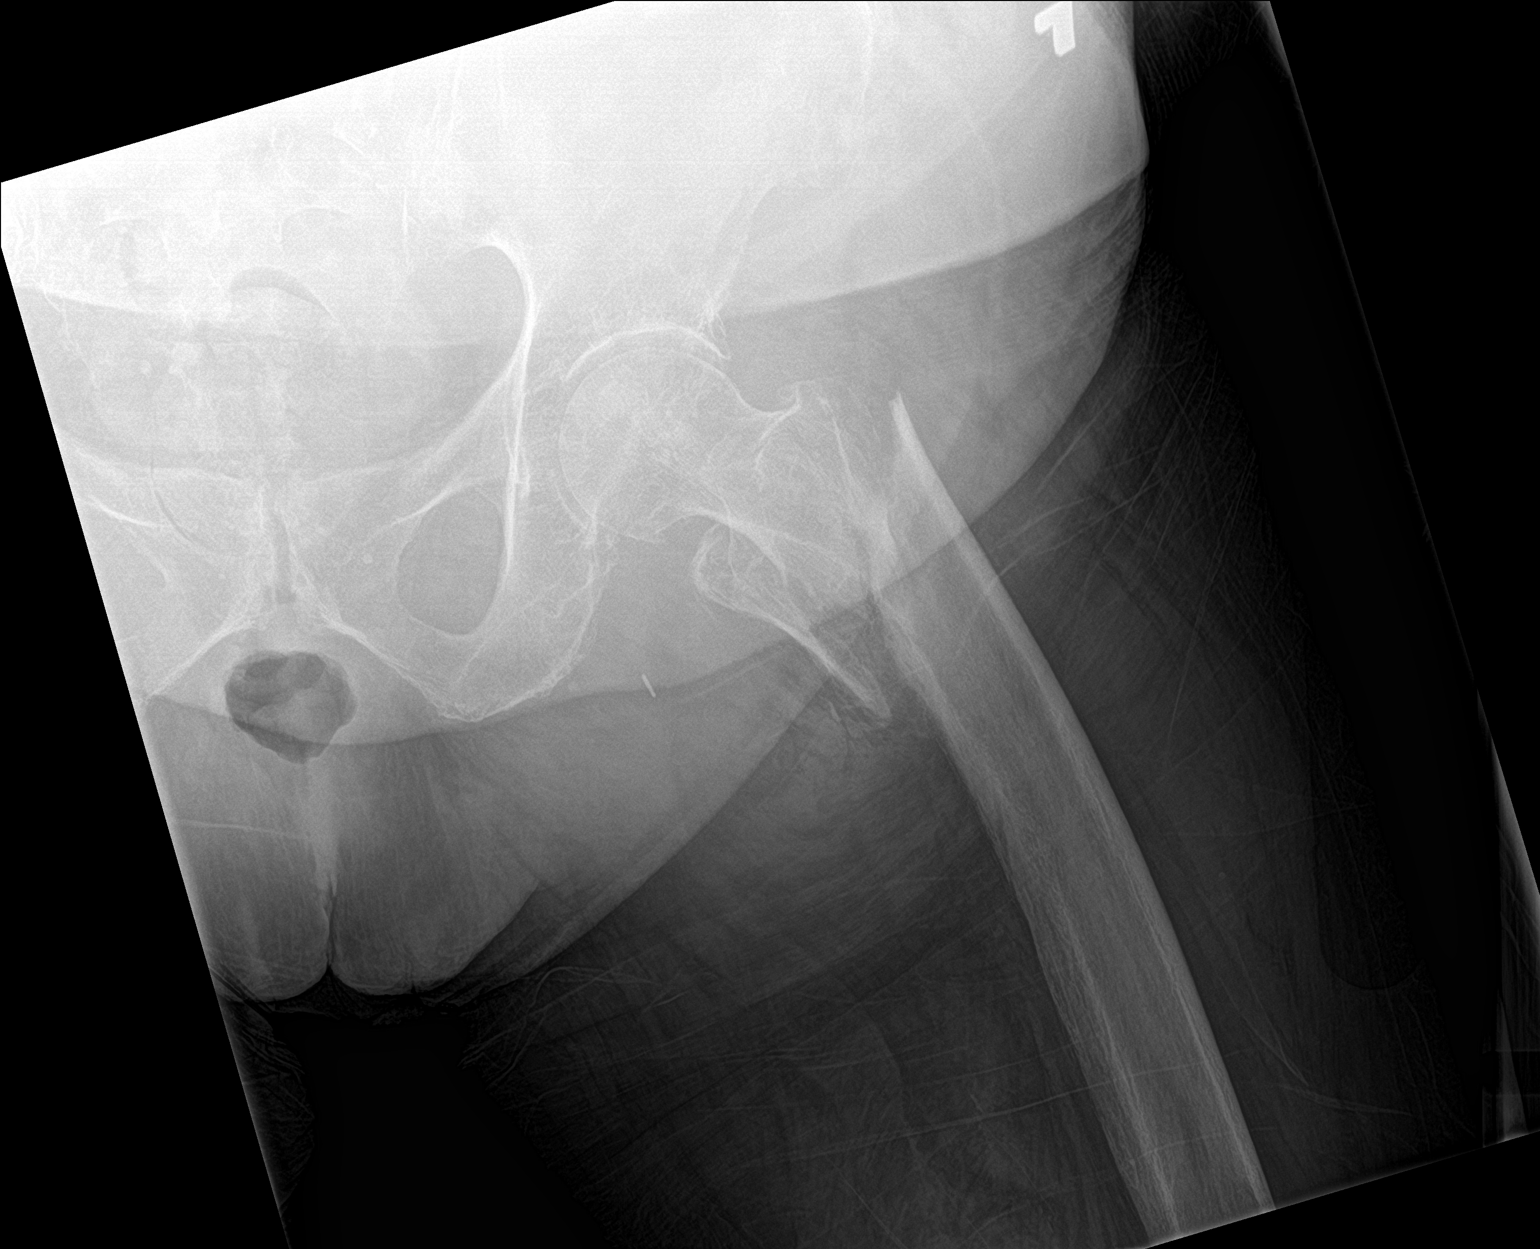

[3 of 3 positions shown; findings below may reference images not displayed]

FINDINGS: A displaced intertrochanteric left hip fracture seen. No evidence of
dislocation.

Minimally displaced fracture is also seen involving the left
superior pubic ramus.
IMPRESSION: Displaced intertrochanteric left hip fracture.

Left superior pubic ramus fracture.

## 2022-03-30 IMAGING — DX DG CHEST 1V PORT
1 series · 1 of 1 positions shown · non-contrast
Comparison: 01/07/2021

CLINICAL DATA: Fall, hip pain

EXAM:
PORTABLE CHEST 1 VIEW

[chest ap]
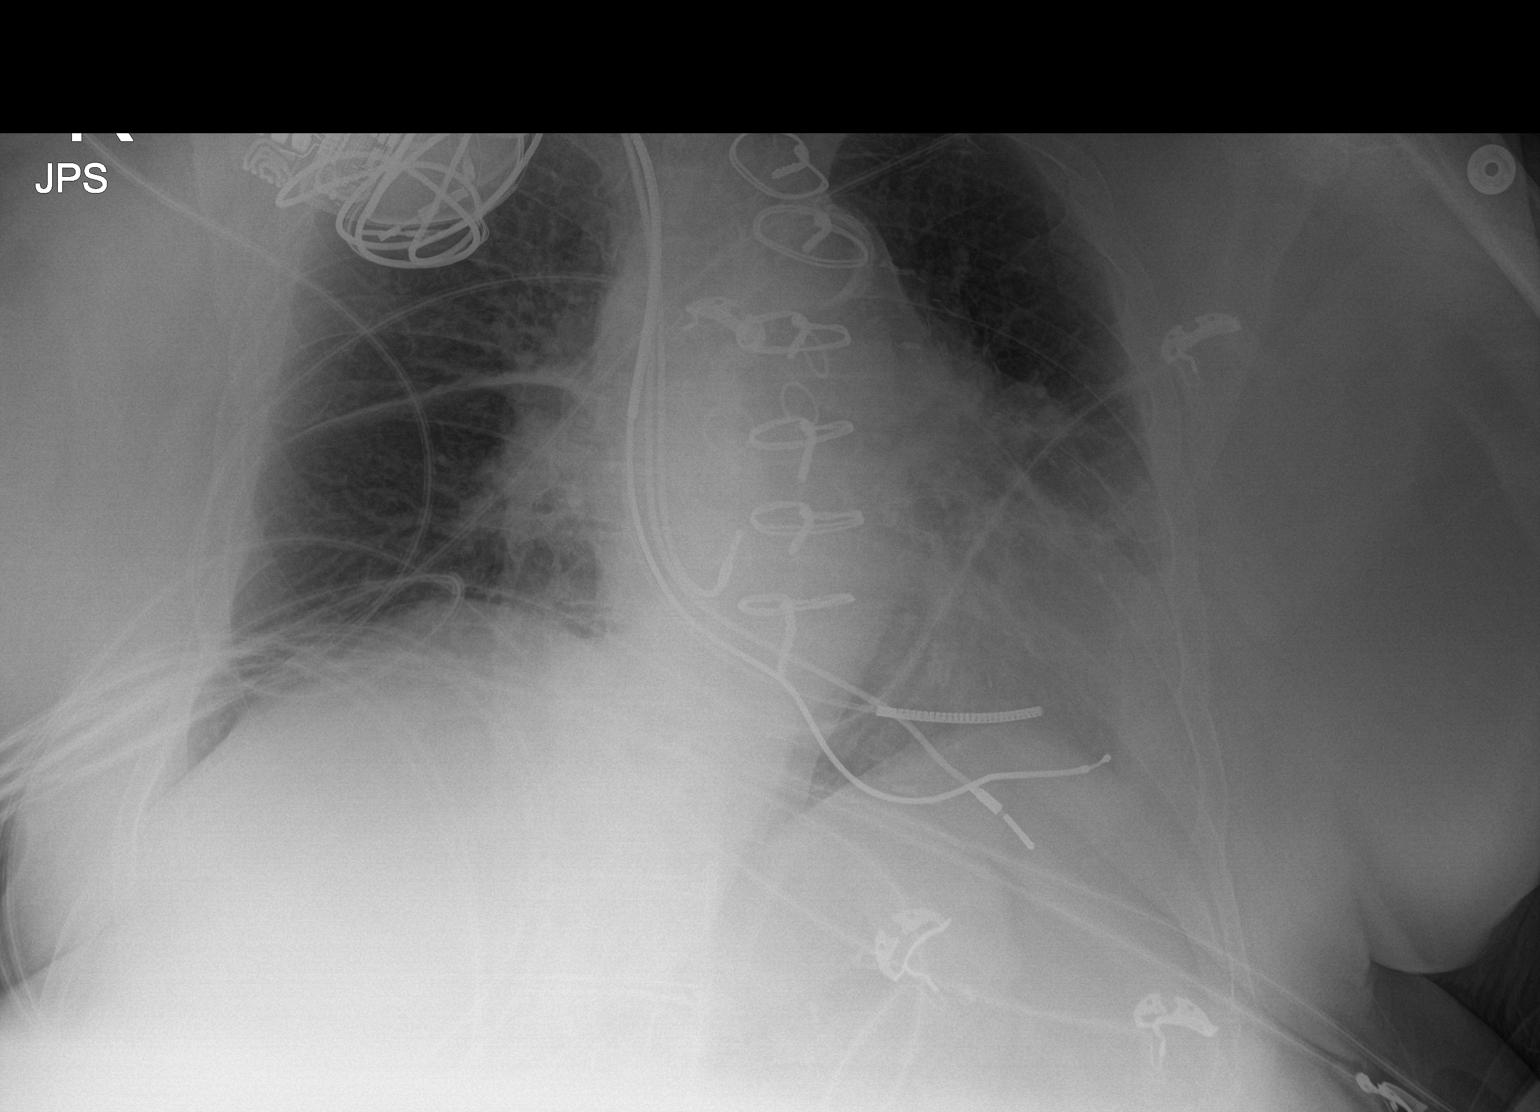

[1 of 1 positions shown; findings below may reference images not displayed]

FINDINGS: Right-sided implanted cardiac device. Stable cardiomegaly. Prior
CABG. Aortic atherosclerosis. Mild pulmonary vascular congestion no
focal airspace consolidation evident on portable supine view. No
large pleural fluid collection. No evidence of a pneumothorax.
IMPRESSION: Cardiomegaly with mild pulmonary vascular congestion.

## 2022-03-31 DIAGNOSIS — K2970 Gastritis, unspecified, without bleeding: Secondary | ICD-10-CM | POA: Diagnosis not present

## 2022-03-31 DIAGNOSIS — K581 Irritable bowel syndrome with constipation: Secondary | ICD-10-CM | POA: Diagnosis not present

## 2022-03-31 DIAGNOSIS — G4730 Sleep apnea, unspecified: Secondary | ICD-10-CM | POA: Diagnosis not present

## 2022-03-31 DIAGNOSIS — B3781 Candidal esophagitis: Secondary | ICD-10-CM | POA: Diagnosis not present

## 2022-03-31 DIAGNOSIS — I10 Essential (primary) hypertension: Secondary | ICD-10-CM | POA: Diagnosis not present

## 2022-03-31 IMAGING — RF DG FEMUR 2+V*L*
1 series · 5 of 5 positions shown · non-contrast
Comparison: Left hip radiograph 04/03/2021
COMPARISON: Left hip radiograph 04/03/2021

Addendum:
CLINICAL DATA: Intramedullary nail placement for intratrochanteric
left femur fracture.

EXAM:
LEFT FEMUR 2 VIEWS

[Series 1: run · 5 of 5 slices shown]
[im 1/5]
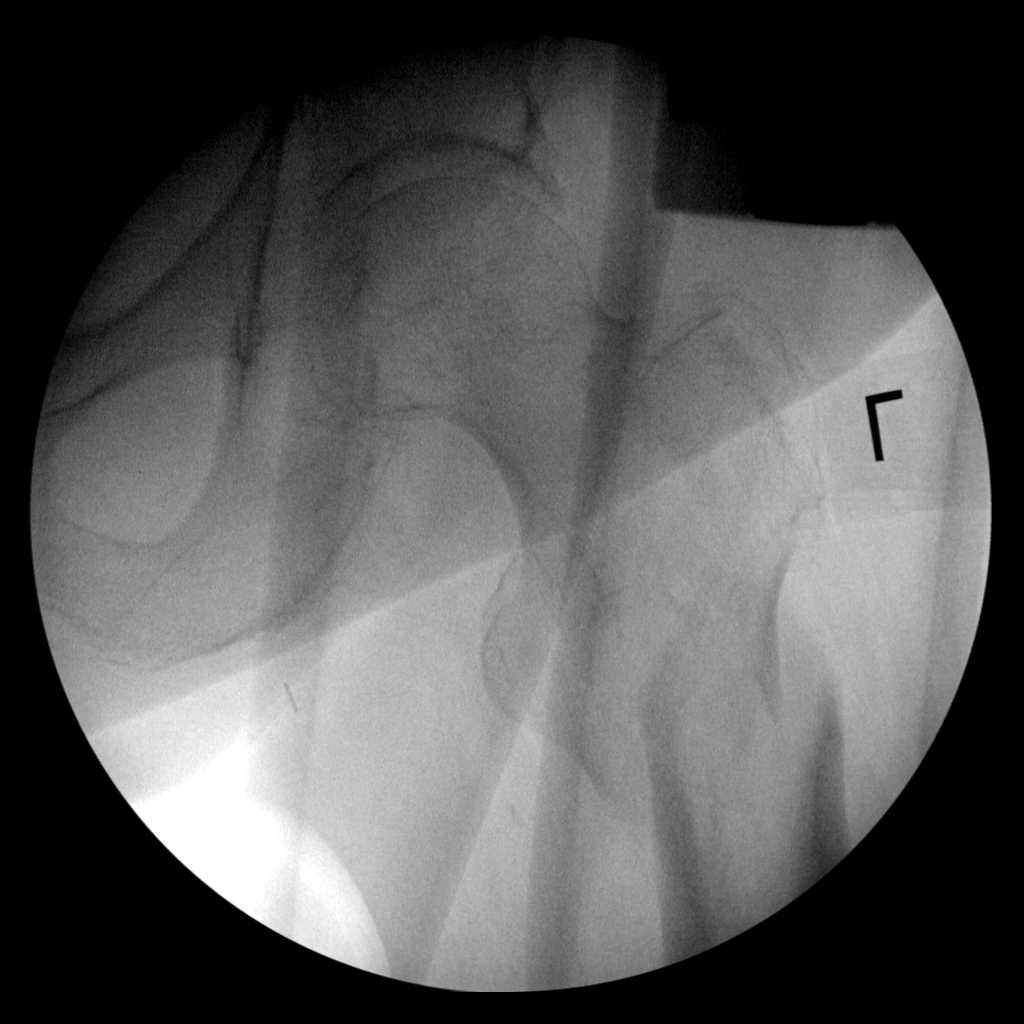
[im 2/5]
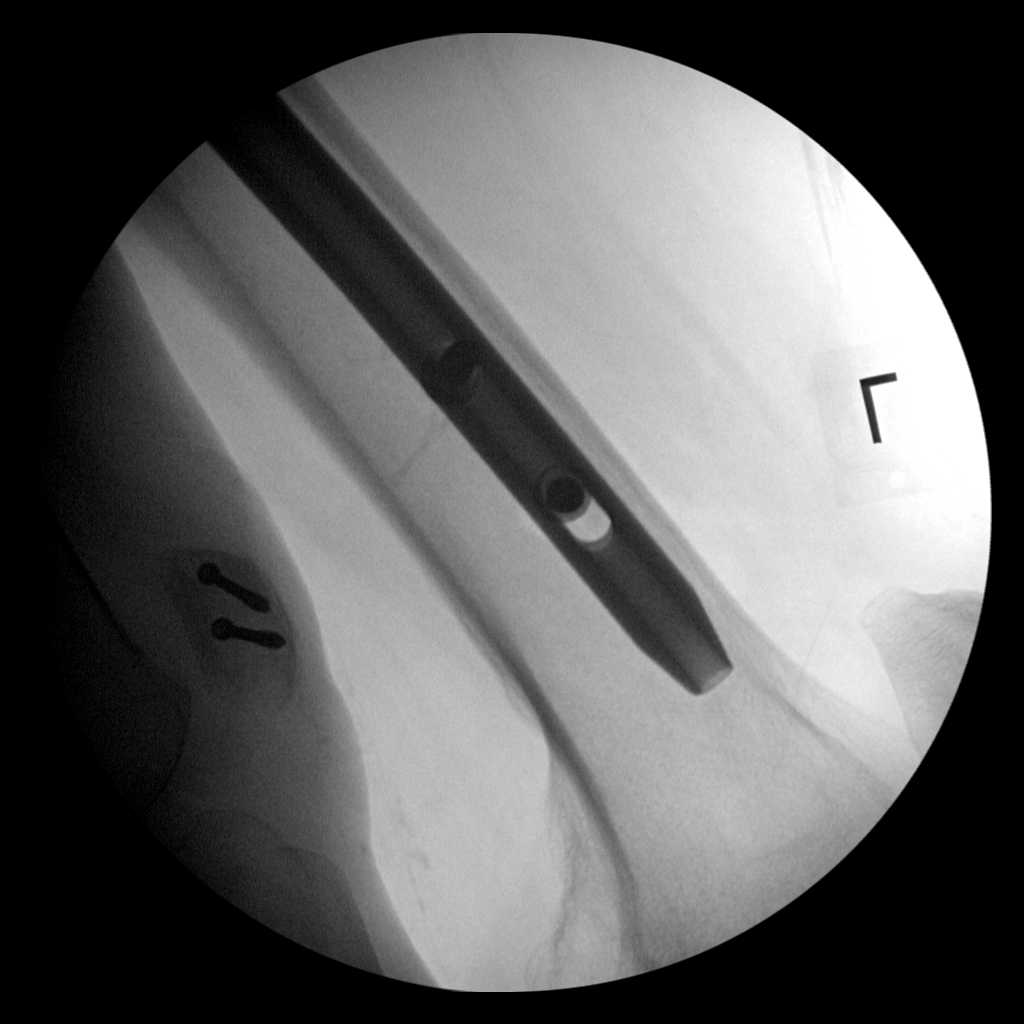
[im 3/5]
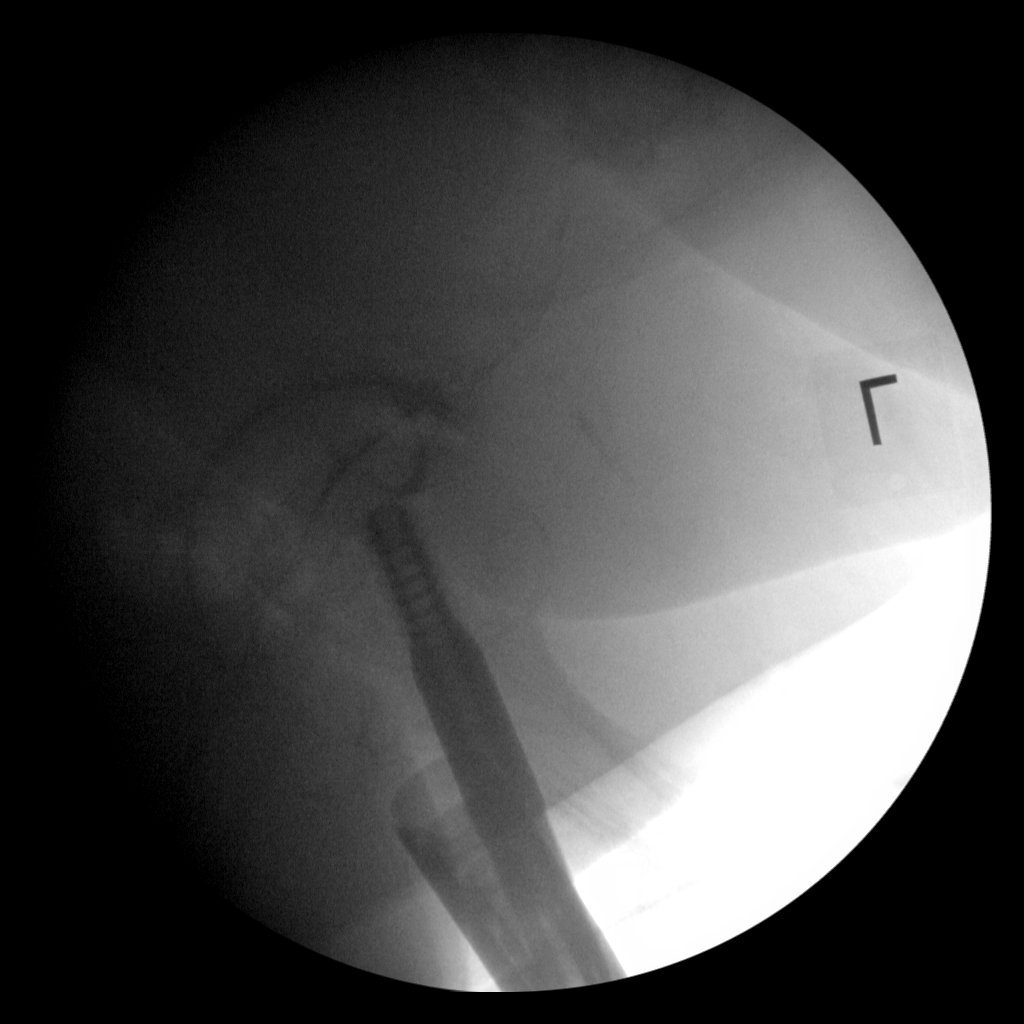
[im 4/5]
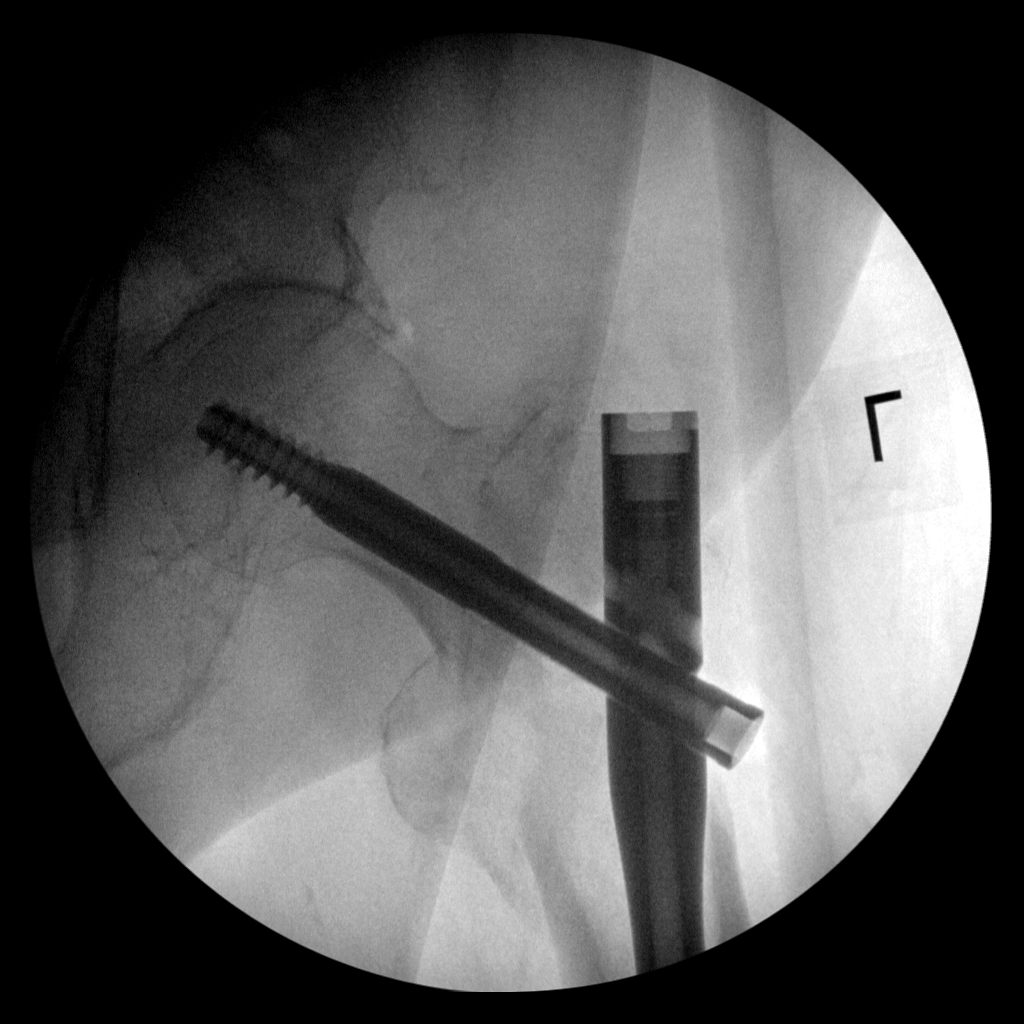
[im 5/5]
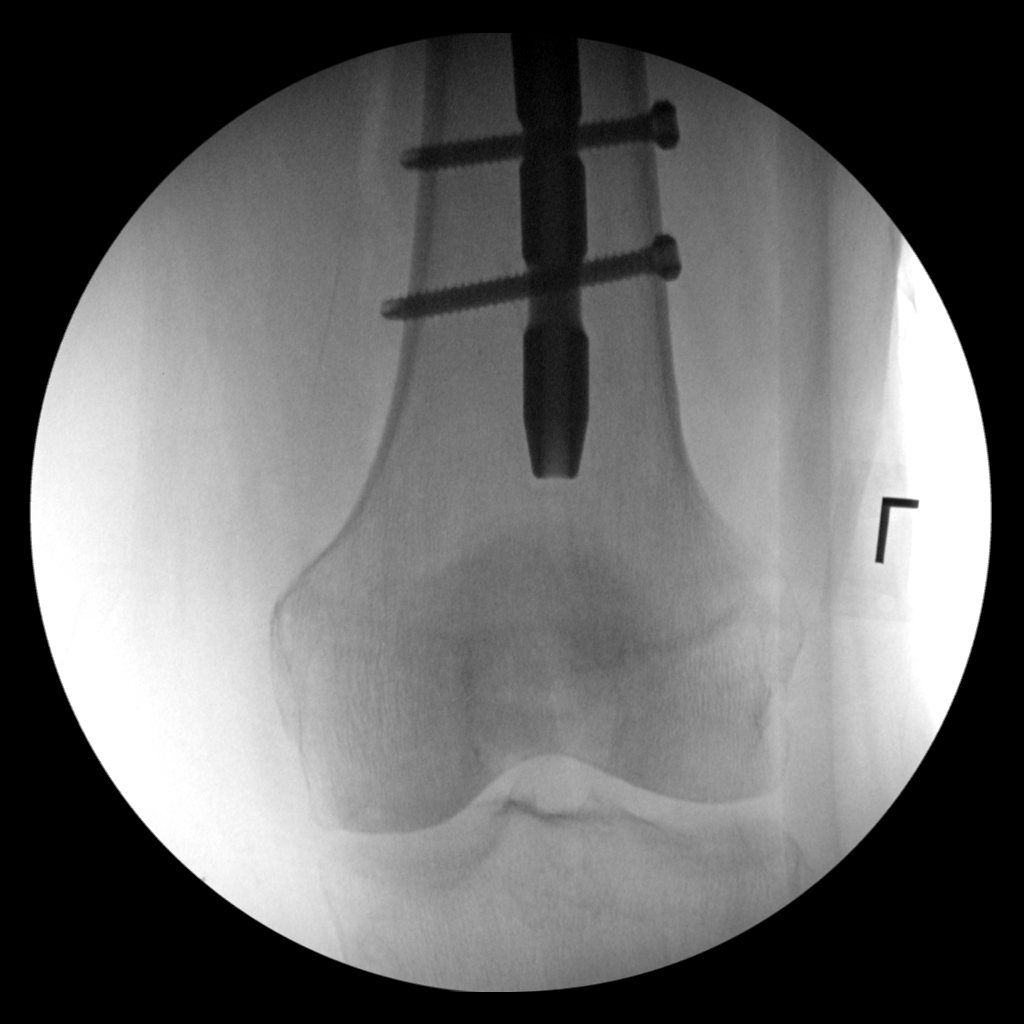

[5 of 5 positions shown; findings below may reference images not displayed]

FINDINGS: Intraoperative IM nail placement.

1 low resolution intraoperative spot views of the left hip were
obtained. Interval reduction of the left intertrochanteric fracture.

Total fluoroscopy time: 5 minutes 27 seconds

Total radiation dose: 132.79 mGy
IMPRESSION: Intraoperative imaging for localization purposes. Please see the
performing provider's procedure report for further details.

ADDENDUM:
Correction to findings as follows:

5 low resolution intraoperative spot views of the left hip and femur
were obtained. Interval reduction of the left intertrochanteric
fracture with placement of intramedullary nail, lag screw and 2
distal locking screws.

*** End of Addendum ***
FINDINGS: Intraoperative IM nail placement.

1 low resolution intraoperative spot views of the left hip were
obtained. Interval reduction of the left intertrochanteric fracture.

Total fluoroscopy time: 5 minutes 27 seconds

Total radiation dose: 132.79 mGy
IMPRESSION: Intraoperative imaging for localization purposes. Please see the
performing provider's procedure report for further details.

## 2022-04-02 ENCOUNTER — Telehealth: Payer: Self-pay | Admitting: Gastroenterology

## 2022-04-02 IMAGING — DX DG CHEST 1V PORT
1 series · 1 of 1 positions shown · non-contrast
Comparison: Chest radiograph dated 04/03/2021.

CLINICAL DATA: Shortness of breath.

EXAM:
PORTABLE CHEST 1 VIEW

[chest ap]
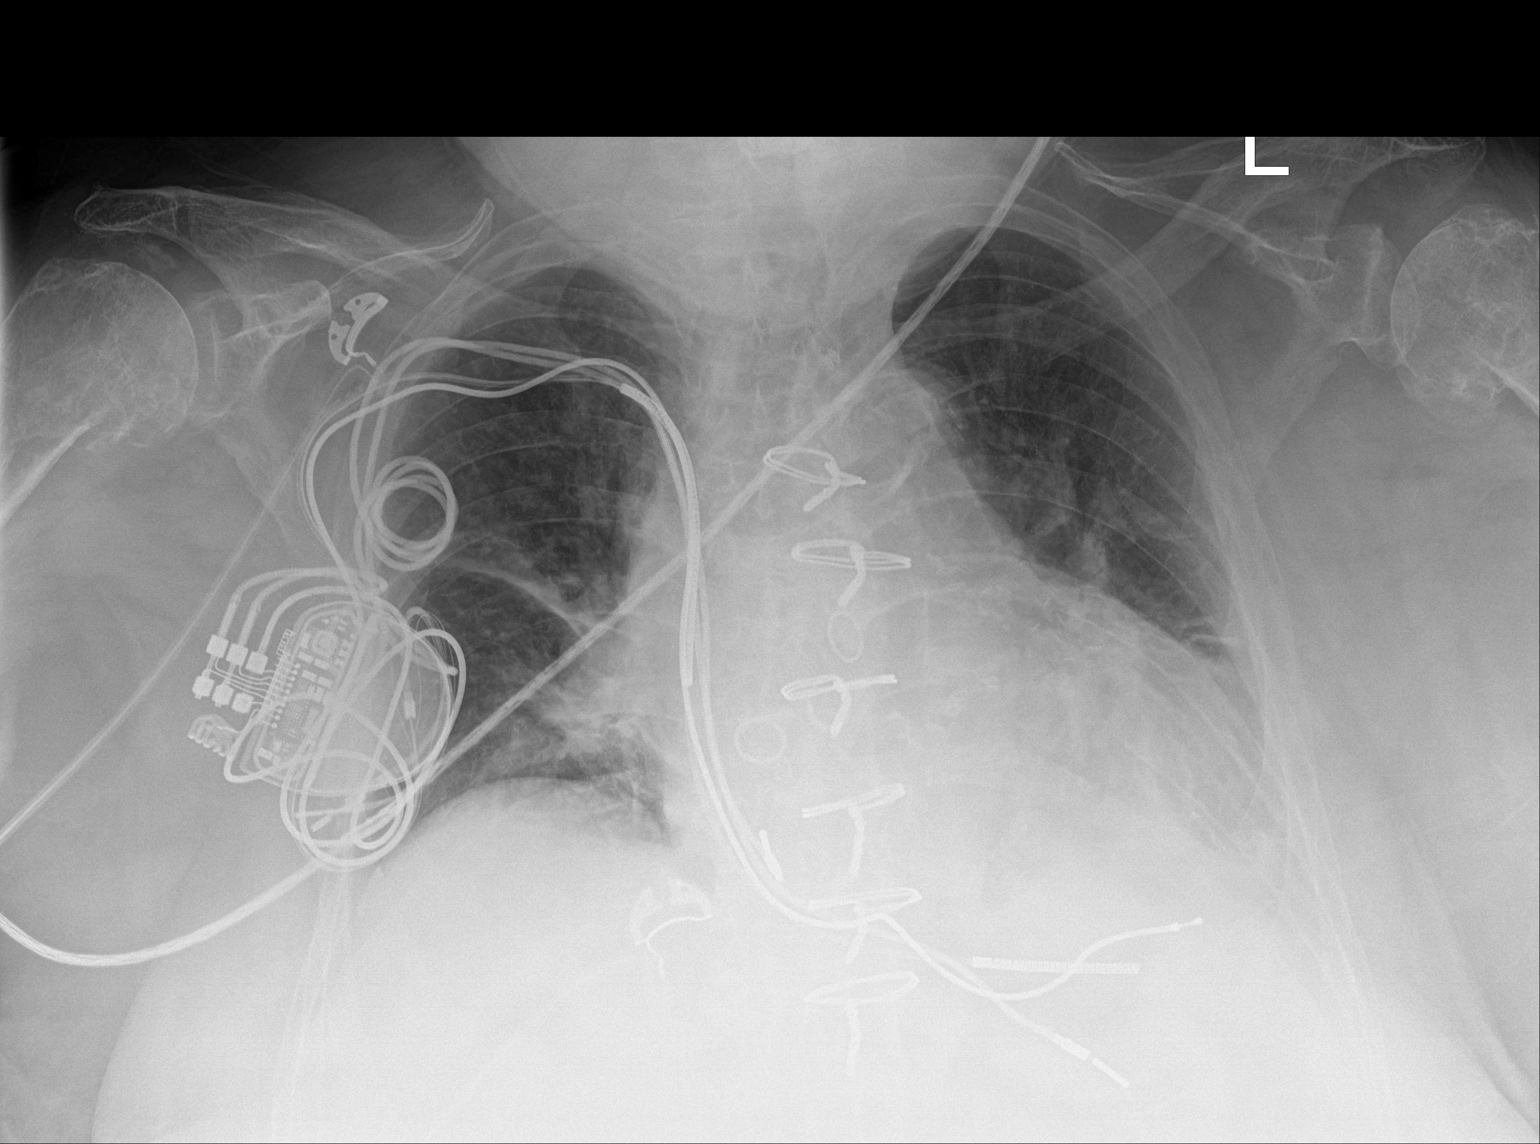

[1 of 1 positions shown; findings below may reference images not displayed]

FINDINGS: Shallow inspiration. Bibasilar streaky atelectasis or infiltrate.
Right perihilar density as seen on the prior radiograph. No new
consolidation. Small left pleural effusion may be present. No
pneumothorax. Stable cardiac silhouette. Atherosclerotic
calcification of the aorta. Median sternotomy wires and right sided
AICD device. No acute osseous pathology. Osteopenia with
degenerative changes of the spine and shoulders.
IMPRESSION: 1. Bibasilar streaky atelectasis or infiltrate.
2. Right perihilar density as seen on the prior radiograph.

## 2022-04-02 IMAGING — US US RENAL
1 series · 14 of 25 positions shown · non-contrast
Comparison: 01/25/2018 and 11/20/2017

CLINICAL DATA: Acute kidney injury.

EXAM:
RENAL / URINARY TRACT ULTRASOUND COMPLETE

[Series 1: us renal · 14 of 49 slices shown]
[im 1/49]
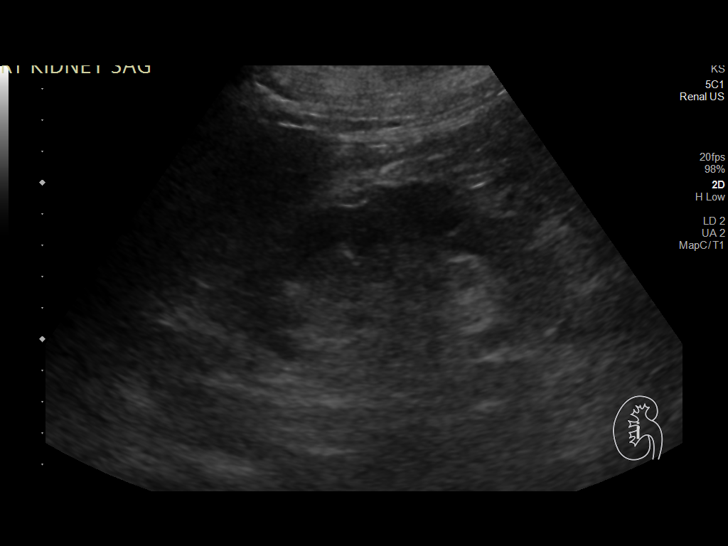
[im 5/49]
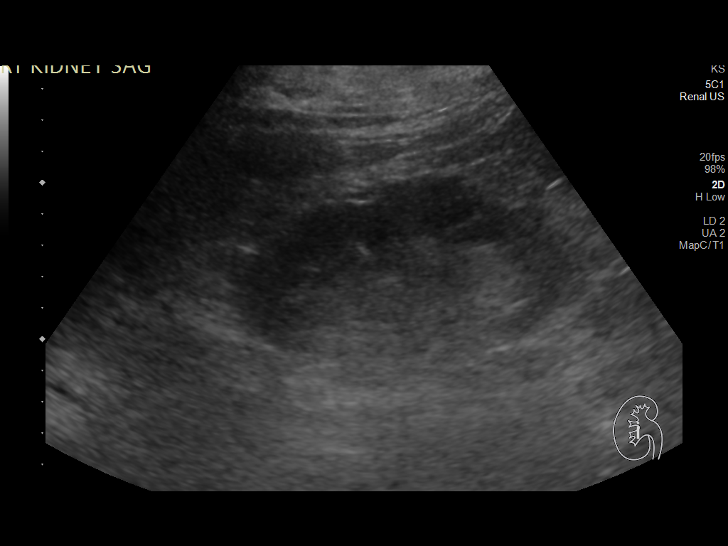
[im 9/49]
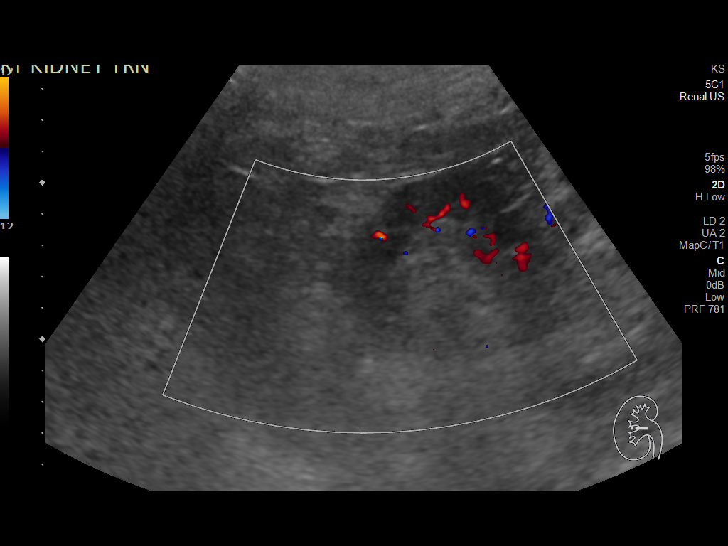
[im 13/49]
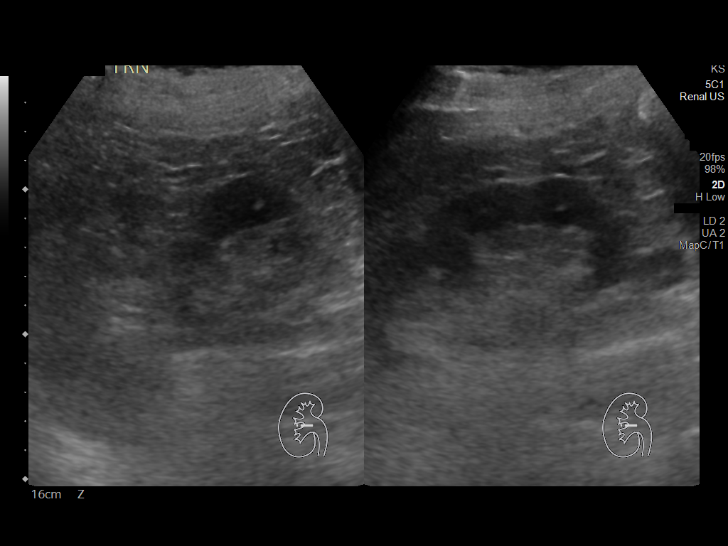
[im 17/49]
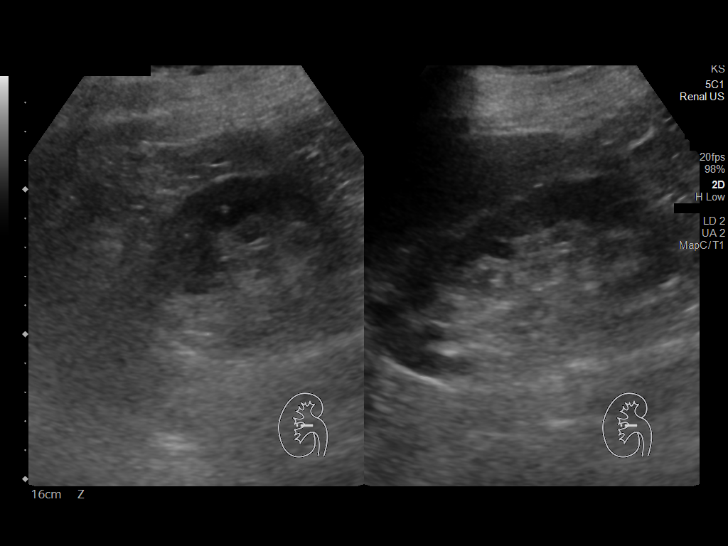
[im 19/49]
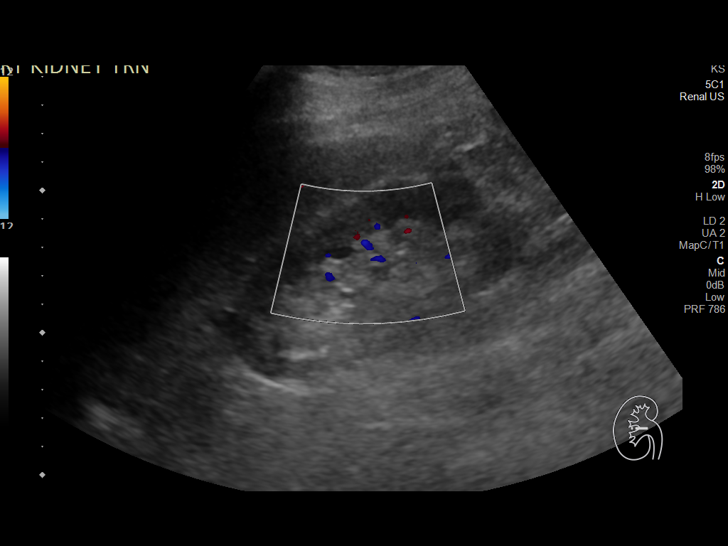
[im 23/49]
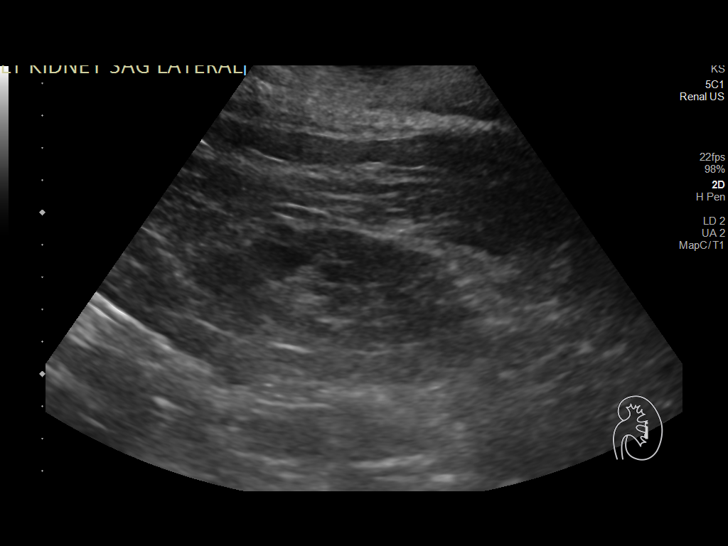
[im 27/49]
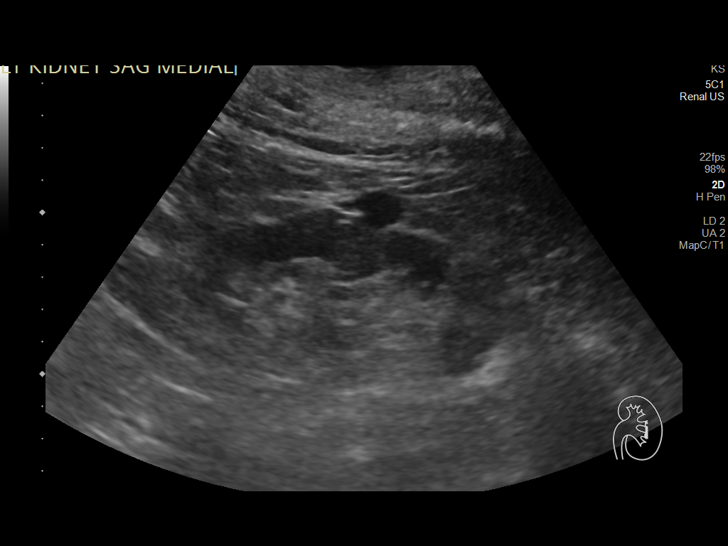
[im 31/49]
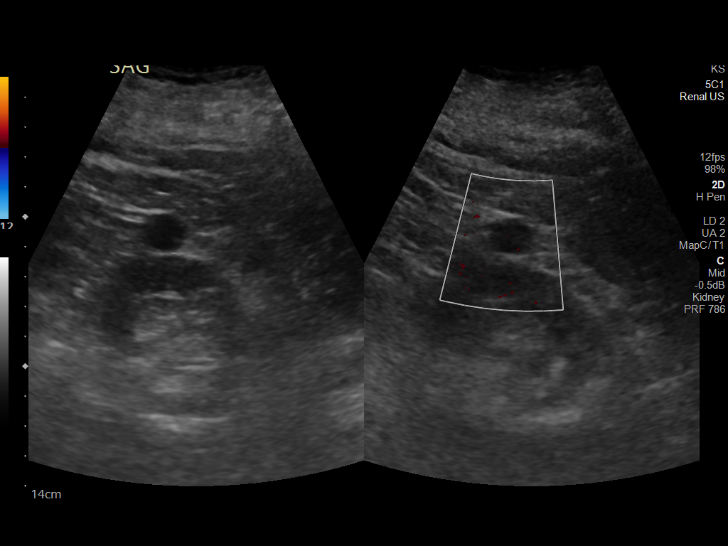
[im 33/49]
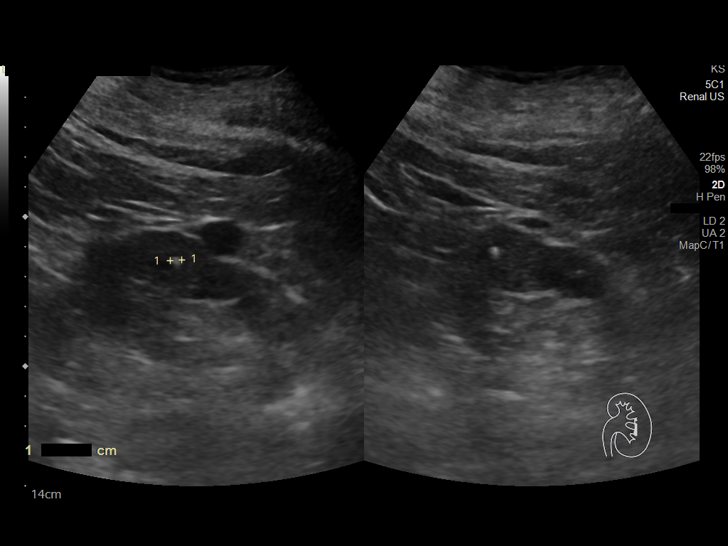
[im 37/49]
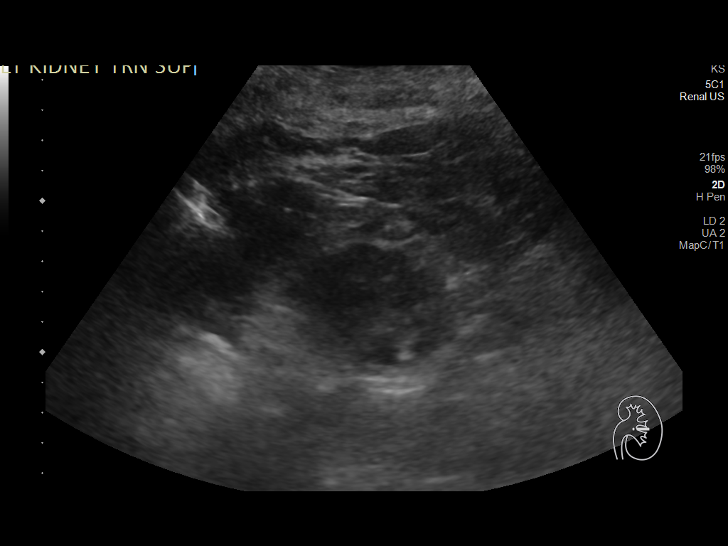
[im 41/49]
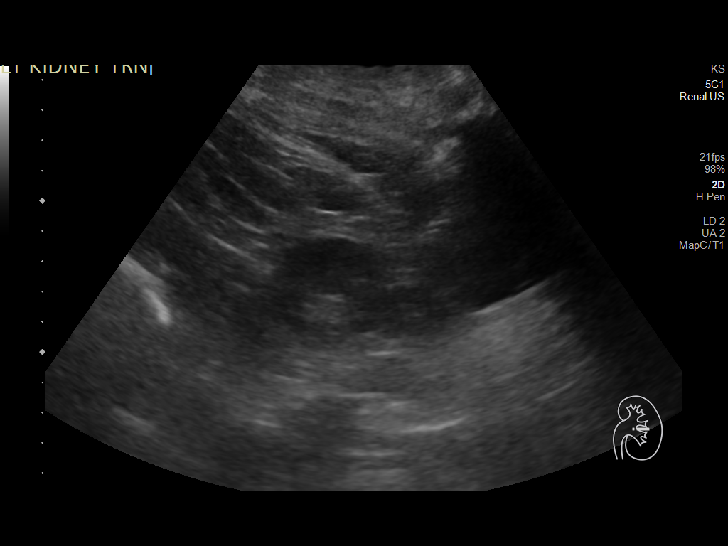
[im 45/49]
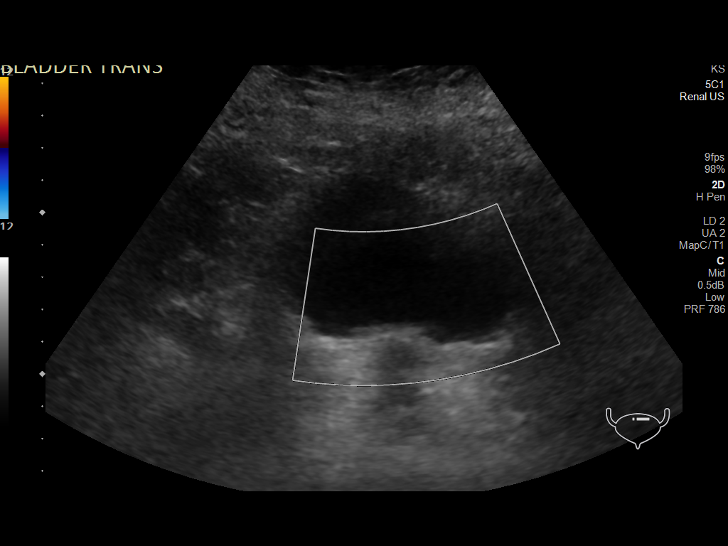
[im 49/49]
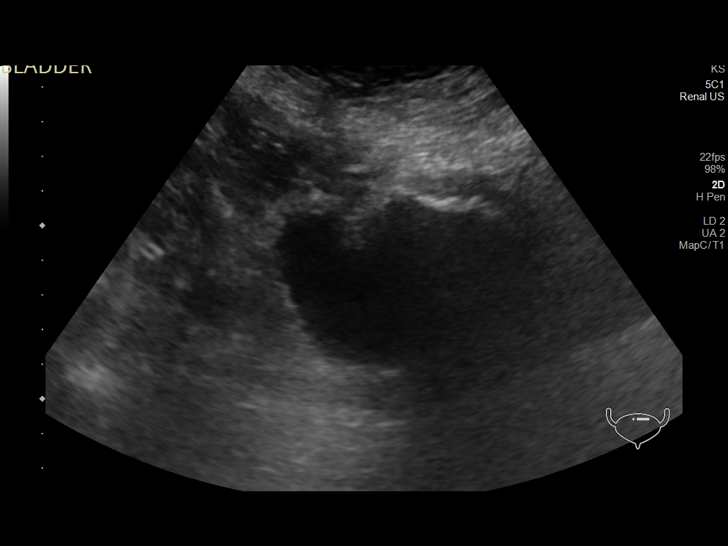

[14 of 25 positions shown; findings below may reference images not displayed]

FINDINGS: Right Kidney:

Renal measurements: 10.8 x 5.9 x 6.4 centimeters = volume: 214.2 mL.
Renal parenchyma is echogenic. There is renal parenchymal thinning.
Multiple cysts measure 1 centimeter or less. No solid mass
identified. No hydronephrosis.

Left Kidney:

Renal measurements: 9.8 x 4.9 x 5.4 centimeters = volume: 135.9 mL.
Increased renal echogenicity. There is renal parenchymal thinning.
Numerous cysts measure up to 1.4 centimeters. No solid mass
identified. No hydronephrosis.

Bladder:

Appears normal for degree of bladder distention.

Other:

None.
IMPRESSION: 1. Bilateral renal parenchymal thinning and renal echogenicity.
2. Small bilateral renal cysts.
3. No suspicious renal mass or hydronephrosis.

## 2022-04-02 NOTE — Telephone Encounter (Signed)
Returned call to patient's sister. I informed her that we need to get patient scheduled for an appt. D. Cirigliano had a cancellation on Friday, 04/04/22 at 9:40 am. Pt's sister asked that I call her facility and provide them with the appt information. Pt is currently at [facility] [phone]).  [place]. I was informed that Neoma Laming, the appt and transportation scheduler will return at 2:40 pm today. They asked that I call back at that time.

## 2022-04-02 NOTE — Telephone Encounter (Signed)
Called and spoke with [name]. She informed me that patient was diagnosed with COVID on 2/12 and they require that they quarantine for 14 days. Pt's appt has been rescheduled to Tuesday, 04/08/22 at 10 am. [name] has the address to the office.

## 2022-04-02 NOTE — Telephone Encounter (Signed)
Patient and patient's sister is calling states the patient is in constant pain, having severe diarrhea, and is losing weight. They are wanting to know what they can do because the rehab she is in is not helping at all. Please advise

## 2022-04-03 DIAGNOSIS — M84359A Stress fracture, hip, unspecified, initial encounter for fracture: Secondary | ICD-10-CM | POA: Diagnosis not present

## 2022-04-03 DIAGNOSIS — I5033 Acute on chronic diastolic (congestive) heart failure: Secondary | ICD-10-CM | POA: Diagnosis not present

## 2022-04-04 ENCOUNTER — Ambulatory Visit: Payer: 59 | Admitting: Gastroenterology

## 2022-04-07 DIAGNOSIS — B3781 Candidal esophagitis: Secondary | ICD-10-CM | POA: Diagnosis not present

## 2022-04-07 DIAGNOSIS — M84359A Stress fracture, hip, unspecified, initial encounter for fracture: Secondary | ICD-10-CM | POA: Diagnosis not present

## 2022-04-07 DIAGNOSIS — D649 Anemia, unspecified: Secondary | ICD-10-CM | POA: Diagnosis not present

## 2022-04-07 DIAGNOSIS — I5033 Acute on chronic diastolic (congestive) heart failure: Secondary | ICD-10-CM | POA: Diagnosis not present

## 2022-04-07 IMAGING — US IR FLUORO GUIDE CV LINE*L*
1 series · 1 of 1 positions shown · non-contrast
Comparison: none

CLINICAL DATA: Renal failure and need for tunneled hemodialysis
catheter.

[Series 1: ir fluoro guide cv line*left* · 1 of 1 slices shown]
[im 1/1]
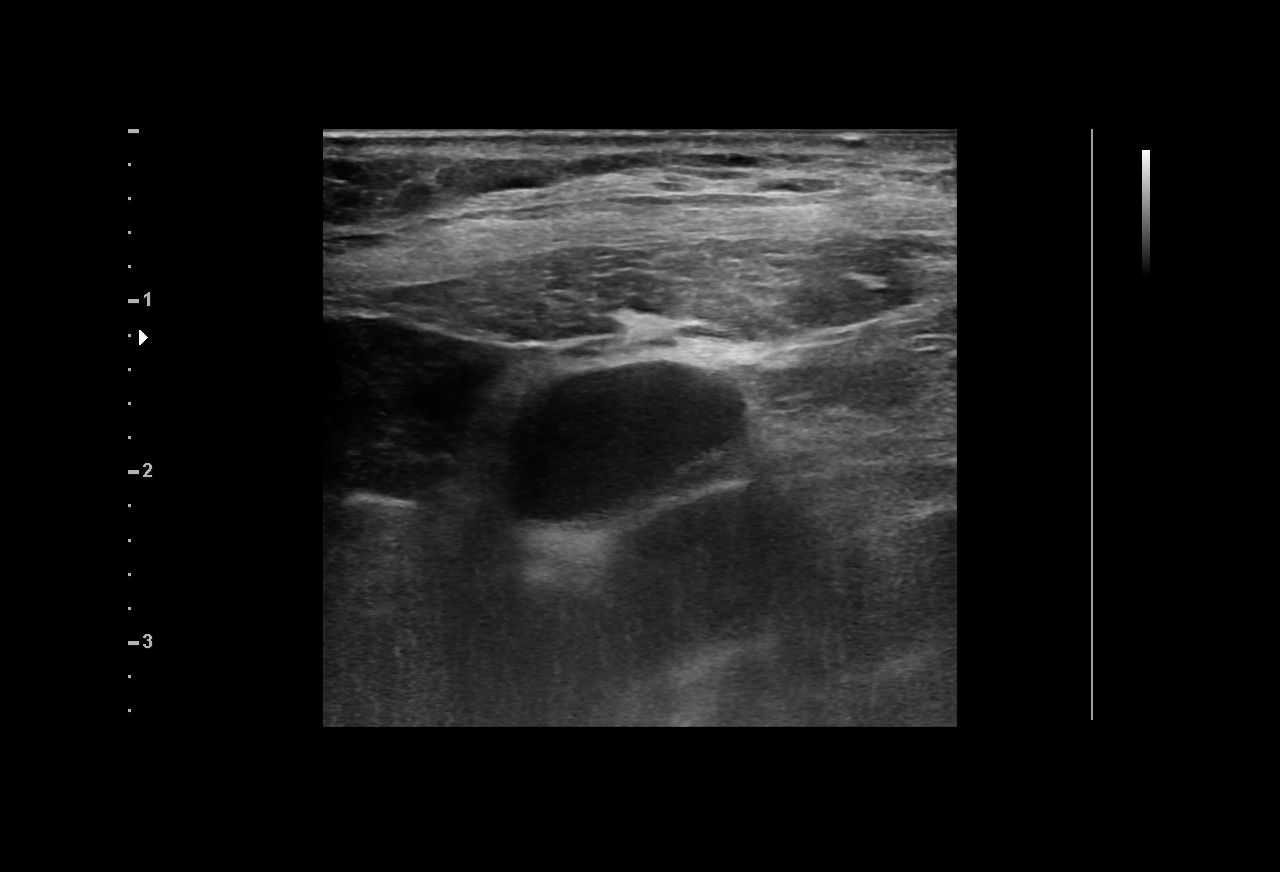

[1 of 1 positions shown; findings below may reference images not displayed]

EXAM:
TUNNELED CENTRAL VENOUS HEMODIALYSIS CATHETER PLACEMENT WITH
ULTRASOUND AND FLUOROSCOPIC GUIDANCE

ANESTHESIA/SEDATION:
Moderate (conscious) sedation was employed during this procedure. A
total of Versed 0.5 mg and Fentanyl 25 mcg was administered
intravenously.

Moderate Sedation Time: 62 minutes. The patient's level of
consciousness and vital signs were monitored continuously by
radiology nursing throughout the procedure under my direct
supervision.

MEDICATIONS:
1 g IV vancomycin.

FLUOROSCOPY:
4 minutes.

PROCEDURE:
The procedure, risks, benefits, and alternatives were explained to
the patient. Questions regarding the procedure were encouraged and
answered. The patient understands and consents to the procedure. A
timeout was performed prior to initiating the procedure.

The left neck and chest were prepped with chlorhexidine in a sterile
fashion, and a sterile drape was applied covering the operative
field. Maximum barrier sterile technique with sterile gowns and
gloves were used for the procedure. Local anesthesia was provided
with 1% lidocaine.

Ultrasound was used to confirm patency of the left internal jugular
vein and a permanent ultrasound image was recorded. After creating a
small venotomy incision, a 21 gauge needle was advanced into the
left internal jugular vein under direct, real-time ultrasound
guidance. Ultrasound image documentation was performed. After
securing guidewire access, an 8 Fr dilator was placed. A J-wire was
kinked to measure appropriate catheter length.

A Palindrome tunneled hemodialysis catheter measuring 28 cm from tip
to cuff was chosen for placement. This was tunneled in a retrograde
fashion from the chest wall to the venotomy incision.

At the venotomy, serial dilatation was performed and a 15 Fr
peel-away sheath was placed over a guidewire. The catheter was then
placed through the sheath and the sheath removed. Final catheter
positioning was confirmed and documented with a fluoroscopic spot
image. The catheter was aspirated, flushed with saline, and injected
with appropriate volume heparin dwells.

The venotomy incision was closed with subcuticular 4-0 Vicryl.
Dermabond was applied to the incision. The catheter exit site was
secured with 0-Prolene retention sutures.

COMPLICATIONS:
None.  No pneumothorax.
FINDINGS: After catheter placement, the tip lies in the right atrium. The
catheter aspirates normally and is ready for immediate use.
IMPRESSION: Placement of tunneled hemodialysis catheter via the left internal
jugular vein. The catheter tip lies in the right atrium. The
catheter is ready for immediate use.

## 2022-04-08 ENCOUNTER — Encounter: Payer: Self-pay | Admitting: Gastroenterology

## 2022-04-08 ENCOUNTER — Ambulatory Visit (INDEPENDENT_AMBULATORY_CARE_PROVIDER_SITE_OTHER): Payer: 59 | Admitting: Gastroenterology

## 2022-04-08 VITALS — BP 132/80 | HR 88 | Ht 63.0 in | Wt 174.0 lb

## 2022-04-08 DIAGNOSIS — R1084 Generalized abdominal pain: Secondary | ICD-10-CM | POA: Diagnosis not present

## 2022-04-08 DIAGNOSIS — J9612 Chronic respiratory failure with hypercapnia: Secondary | ICD-10-CM | POA: Diagnosis not present

## 2022-04-08 DIAGNOSIS — J9611 Chronic respiratory failure with hypoxia: Secondary | ICD-10-CM | POA: Diagnosis not present

## 2022-04-08 DIAGNOSIS — Z9861 Coronary angioplasty status: Secondary | ICD-10-CM | POA: Diagnosis not present

## 2022-04-08 DIAGNOSIS — T402X5A Adverse effect of other opioids, initial encounter: Secondary | ICD-10-CM

## 2022-04-08 DIAGNOSIS — I2510 Atherosclerotic heart disease of native coronary artery without angina pectoris: Secondary | ICD-10-CM

## 2022-04-08 DIAGNOSIS — K5903 Drug induced constipation: Secondary | ICD-10-CM

## 2022-04-08 DIAGNOSIS — I5022 Chronic systolic (congestive) heart failure: Secondary | ICD-10-CM | POA: Diagnosis not present

## 2022-04-08 DIAGNOSIS — I509 Heart failure, unspecified: Secondary | ICD-10-CM

## 2022-04-08 DIAGNOSIS — G4733 Obstructive sleep apnea (adult) (pediatric): Secondary | ICD-10-CM

## 2022-04-08 DIAGNOSIS — M19072 Primary osteoarthritis, left ankle and foot: Secondary | ICD-10-CM | POA: Diagnosis not present

## 2022-04-08 DIAGNOSIS — N1832 Chronic kidney disease, stage 3b: Secondary | ICD-10-CM | POA: Diagnosis not present

## 2022-04-08 DIAGNOSIS — R110 Nausea: Secondary | ICD-10-CM

## 2022-04-08 DIAGNOSIS — M6281 Muscle weakness (generalized): Secondary | ICD-10-CM | POA: Diagnosis not present

## 2022-04-08 DIAGNOSIS — J9621 Acute and chronic respiratory failure with hypoxia: Secondary | ICD-10-CM | POA: Diagnosis not present

## 2022-04-08 IMAGING — DX DG CHEST 1V PORT
1 series · 1 of 1 positions shown · non-contrast
Comparison: Portable exam 7277 hours compared to 04/06/2021

CLINICAL DATA: Respiratory failure

EXAM:
PORTABLE CHEST 1 VIEW

[chest]
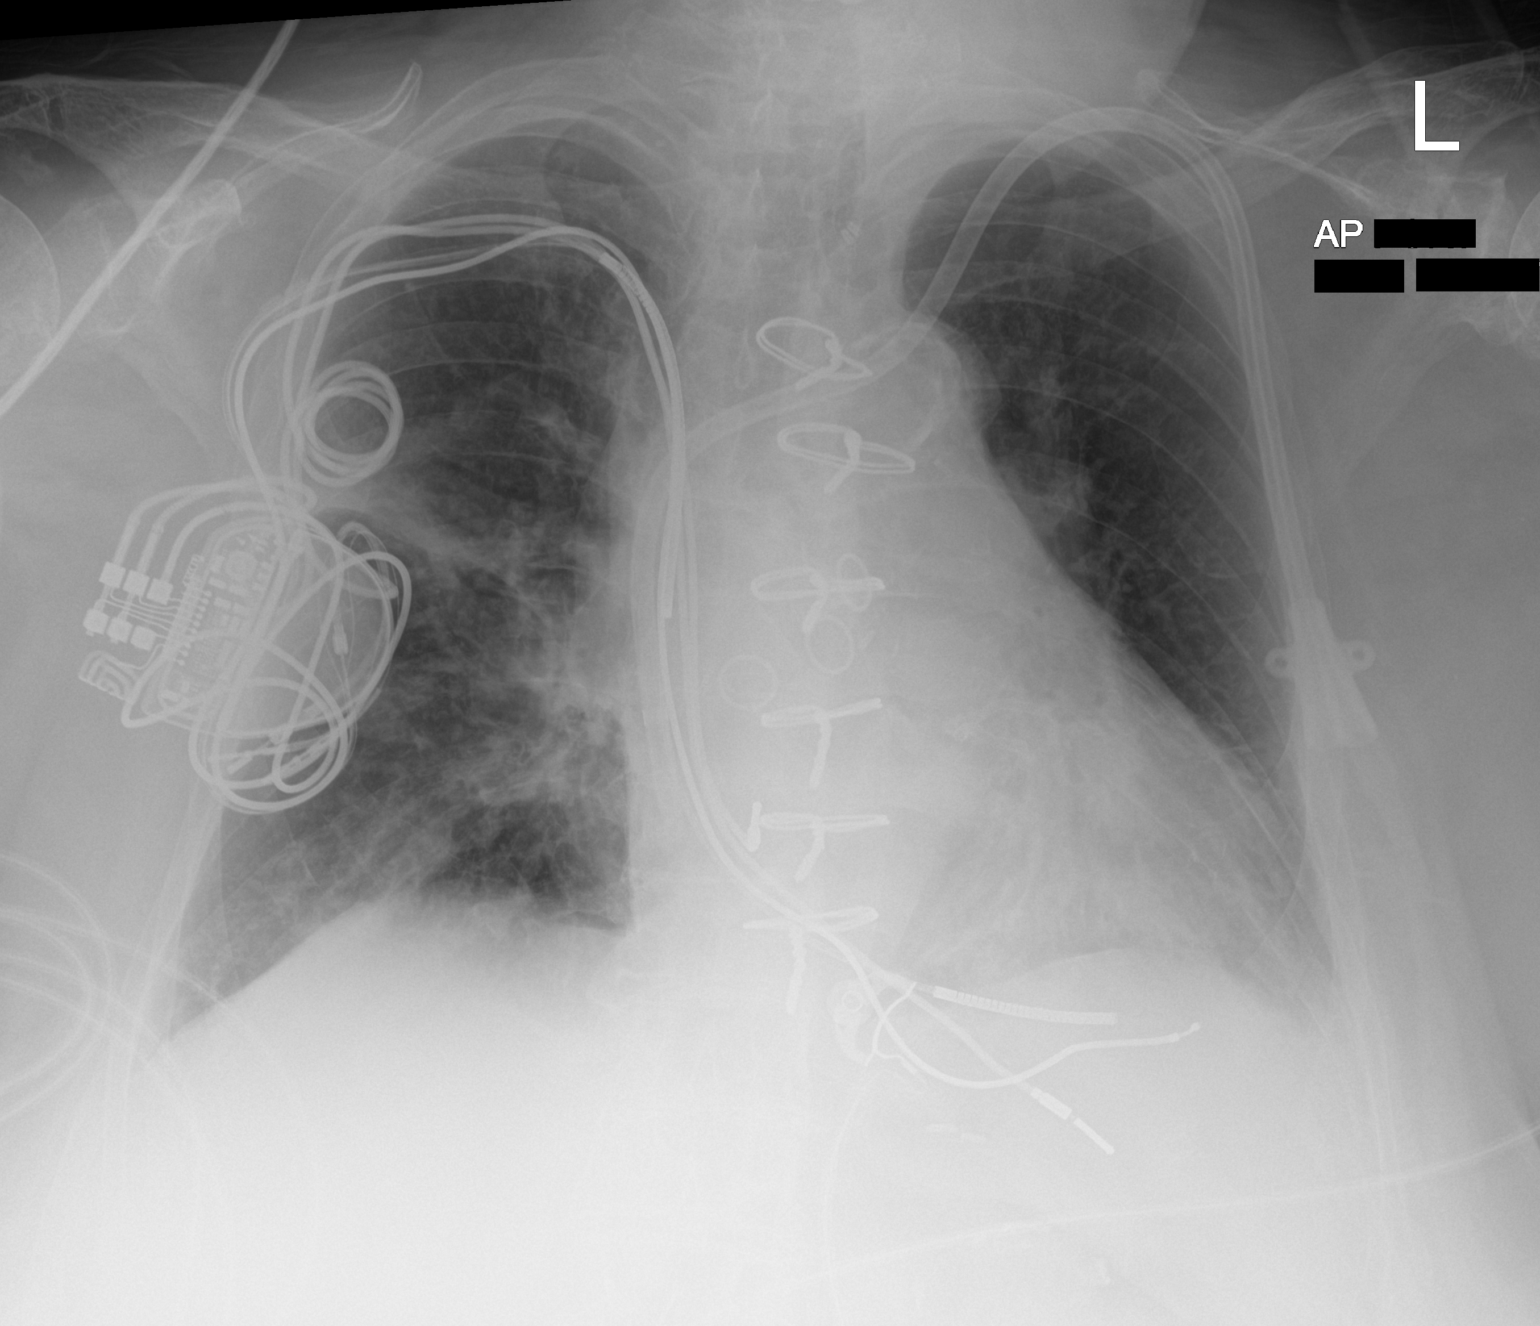

[1 of 1 positions shown; findings below may reference images not displayed]

FINDINGS: LEFT subclavian ICD lines project over RIGHT atrium and RIGHT
ventricle.

LEFT jugular dual-lumen central venous catheter tip projects over
RIGHT atrium.

Mild enlargement of cardiac silhouette post CABG.

Atherosclerotic calcification aorta.

RIGHT mid lung and RIGHT basilar opacities which may represent
atelectasis and/or infiltrate.

Mild LEFT basilar atelectasis.

No definite pleural effusion or pneumothorax.
IMPRESSION: Enlargement of cardiac silhouette post CABG and ICD.

Atelectasis versus infiltrate RIGHT lung, slightly increased from
previous exam.

## 2022-04-08 MED ORDER — NALOXEGOL OXALATE 12.5 MG PO TABS: 12.5000 mg | ORAL_TABLET | Freq: Every day | ORAL | 3 refills | Status: DC

## 2022-04-08 MED ORDER — DICYCLOMINE HCL 10 MG PO CAPS: 10.0000 mg | ORAL_CAPSULE | Freq: Four times a day (QID) | ORAL | 3 refills | Status: DC | PRN

## 2022-04-08 NOTE — Progress Notes (Signed)
Chief Complaint:    Abdominal pain, nausea   HPI:     Patient is a 77 y.o. female with a history of CAD  s/p CABG 60, CHF s/p AICD, CVA econdary to high-grade ICA stenosis status post stent August 2022 (on ASA/Plavix), diabetes, HLD, OSA, CKD 3, chronic oxygen use, wheelchair-bound, vascular disease and SMA stenosis but not critical , [facility] for hospital follow-up.  Last seen in the GI clinic by Vicie Mutters on 05/27/2021.  Main issue at that time was chronic epigastric pain and chronic constipation in the setting of chronic opioid use.  Also with episodic fecal incontinence (and urinary incontinence) with suspicion for pelvic floor etiology.  Increased Linzess to 290 mcg - Mesenteric ultrasound: Less than 70% stenosis of SMA  Admitted earlier this month with acute on chronic abdominal pain, nausea, decreased appetite, along with acute on chronic anemia with Hgb 7.0 (baseline~8) and heme positive stool.  Denies any overt bleeding.  Transfused 1 unit during admission. Initial concern for acute pancreatitis with lipase 1500, but CT with normal-appearing pancreas. -03/17/22: CT A/P: Diverticulosis without diverticulitis, rectum moderately distended with stool, otherwise normal GI tract. 2 right renal masses increased in size from previous CT (complicated/hemorrhagic cyst likely, but recommended renal ultrasound).  Stable small lung base nodules. - 03/17/2022: RUQ [place]: Steatosis, ccy, CBD 9.3 mm.  Right renal cyst - 03/20/2022: EGD: White plaques in esophagus (path: Reflux esophagitis no Candida), moderate non-H. pylori gastritis with focal intestinal metaplasia.  Started Diflucan, continue high-dose PPI, started Carafate.  Recommended against surveillance endoscopy for GIM based on overall health and significant colitis - 03/24/2022: Discharge from hospital with H/H 9/20.7   Separately, history of chronic constipation, treated with MiraLAX twice daily.  Linzess caused  diarrhea so this was stopped in the past (although on medication list today). Currently with BM daily, but can also have fecal leakage.   Today, main issue is generalized abdominal pain "all over" along with nausea without emesis.  These are essentially the same symptoms as she was having in the hospital and same as her chronic abdominal pain that she has been having for "a long time". No change in location, intensity, or quality. No clear alleviating factors. Pain tends to be worse with PO, same with nausea.   Prescribed Zofran 4 mg daily prn, along with FeSO4, folic acid, [id number], oxycodone, Robaxin,   Completing fluconazole today.  Still on high-dose PPI.  Review of systems:     No chest pain, no SOB, no fevers, no urinary sx   Past Medical History:  Diagnosis Date   AICD (automatic cardioverter/defibrillator) present    downgraded to CRT-P in 2014   Anemia    Arthritis    Asthma    Atrial fibrillation ([place]) 10/24/2015   Questionable history of A Fib/A Flutter. On device interrogation on 9/7, showed 0.21mn of A Fib/A Flutter with 1% burden.  Device check in Jan 2018 showed no sustained AF (runs of less than 30 seconds). No anticoagulation indicated.   CAD (coronary artery disease)    a. 10/09/16 LHC: SVG->LAD patent, SVG->Diag patent, SVG->RCA patent, SVG->LCx occluded. EF 60%, b. 10/31/16 LHC DES to AV groove Circ, DES to intermed branch   Chronic lower back pain    Fatty liver disease, nonalcoholic    GERD (gastroesophageal reflux disease)    Gout    H/O hiatal hernia    High cholesterol    Hyperplastic colon polyp    Hypertension  IBS (irritable bowel syndrome)    Internal hemorrhoids    INTERNAL HEMORRHOIDS WITHOUT MENTION COMP 04/12/2007   Qualifier: Diagnosis of  By: Olevia Perches MD, Dora M    Nonischemic cardiomyopathy ([place]) 11/22/2011   Pt responded to BiV ICD- last EF 55-60% Sept 2017   Obesity    OSA (obstructive sleep apnea)    "can't tolerate a mask", wears 2L nocturnal O2    Presence of permanent cardiac pacemaker    11/02/12 [employer] [id number] INTUA PPM   Stroke [facility]) 09/2020   Type II diabetes mellitus ([place])     Patient's surgical history, family medical history, social history, medications and allergies were all reviewed in Epic    Current Outpatient Medications  Medication Sig Dispense Refill   acetaminophen (TYLENOL) 325 MG tablet Take 2 tablets (650 mg total) by mouth every 6 (six) hours as needed for mild pain (or Fever >/= 101).     albuterol (PROVENTIL HFA;VENTOLIN HFA) 108 (90 Base) MCG/ACT inhaler Inhale 2 puffs into the lungs every 6 (six) hours as needed for wheezing or shortness of breath. 1 Inhaler 0   albuterol (PROVENTIL) (2.5 MG/3ML) 0.083% nebulizer solution Take 3 mLs (2.5 mg total) by nebulization every 6 (six) hours as needed for wheezing or shortness of breath. 75 mL 0   allopurinol (ZYLOPRIM) 100 MG tablet Take 1 tablet (100 mg total) by mouth daily. Please contact the office to schedule appointment for additional refills. 1st Attempt. (Patient taking differently: Take 100 mg by mouth daily.) 30 tablet 0   barrier cream (NON-SPECIFIED) CREA Apply 1 Application topically See admin instructions. Apply topically to buttocks every shift and as needed for each incontinence episode.     budesonide-formoterol (SYMBICORT) 160-4.5 MCG/ACT inhaler Inhale 1 puff into the lungs 2 (two) times daily.     Cal Carb-Mag Hydrox-Simeth (MYLANTA COAT & COOL) 1200-270-80 MG/10ML SUSP Take 30 mLs by mouth 3 (three) times daily as needed (gas pains).     carboxymethylcellulose (REFRESH PLUS) 0.5 % SOLN Place 1 drop into both eyes 3 (three) times daily as needed (dry eyes).     clopidogrel (PLAVIX) 75 MG tablet Take 1 tablet (75 mg total) by mouth daily. 90 tablet 3   cycloSPORINE (RESTASIS) 0.05 % ophthalmic emulsion Place 1 drop into both eyes 2 (two) times daily.     fluconazole (DIFLUCAN) 200 MG tablet Take 1 tablet (200 mg total) by mouth daily for 16  days. 16 tablet 0   fluticasone (FLONASE) 50 MCG/ACT nasal spray Place 1 spray into both nostrils daily as needed for allergies.     folic acid (FOLVITE) 1 MG tablet Take 1 tablet (1 mg total) by mouth daily. 30 tablet 0   furosemide (LASIX) 40 MG tablet Take 1 tablet (40 mg total) by mouth 2 (two) times daily. 5 day course. (02/26/22-03/02/22) (Patient taking differently: Take 40 mg by mouth 2 (two) times daily.) 60 tablet 0   hydrOXYzine (ATARAX) 25 MG tablet Take 25 mg by mouth 2 (two) times daily as needed for itching.     isosorbide mononitrate (IMDUR) 30 MG 24 hr tablet Take 2 tablets (60 mg total) by mouth daily. 270 tablet 3   Lido-PE-Glycerin-Petrolatum (PREPARATION H RAPID RELIEF RE) Place 1 spray rectally every 6 (six) hours as needed (hemorrhoids).     lidocaine 4 % Place 1 patch onto the skin daily as needed (pain).     linaclotide (LINZESS) 290 MCG CAPS capsule Take 290 mcg by mouth daily before breakfast.  methocarbamol (ROBAXIN) 500 MG tablet Take 500 mg by mouth every 6 (six) hours as needed for muscle spasms.     ondansetron (ZOFRAN) 4 MG tablet Take 4 mg by mouth daily as needed for nausea or vomiting.     oxyCODONE (OXY IR/ROXICODONE) 5 MG immediate release tablet Take 1 tablet (5 mg total) by mouth every 6 (six) hours as needed for severe pain. 10 tablet 0   OXYGEN Inhale 2-3 L/min into the lungs continuous.     oxymetazoline (AFRIN) 0.05 % nasal spray Place 1 spray into both nostrils 2 (two) times daily as needed for congestion.     pantoprazole (PROTONIX) 40 MG tablet Take 1 tablet (40 mg total) by mouth 2 (two) times daily. 180 tablet 3   polyethylene glycol powder (GLYCOLAX/MIRALAX) 17 GM/SCOOP powder Take 17 g by mouth daily as needed (constipation).     potassium chloride SA (KLOR-CON M) 20 MEQ tablet Take 1 tablet (20 mEq total) by mouth 2 (two) times daily. 60 tablet 0   rosuvastatin (CRESTOR) 20 MG tablet Take 1 tablet (20 mg total) by mouth every evening. NEED OV.  (Patient taking differently: Take 20 mg by mouth every evening.) 90 tablet 3   simethicone (MYLICON) 80 MG chewable tablet Chew 80 mg by mouth 4 (four) times daily as needed for flatulence.     sucralfate (CARAFATE) 1 GM/10ML suspension Take 10 mLs (1 g total) by mouth 4 (four) times daily -  with meals and at bedtime. 420 mL 0   vitamin B-12 (CYANOCOBALAMIN) 1000 MCG tablet Take 1 tablet (1,000 mcg total) by mouth daily. 30 tablet 1   ferrous sulfate 325 (65 FE) MG tablet Take 1 tablet (325 mg total) by mouth daily with breakfast. 30 tablet 0   nitroGLYCERIN (NITROSTAT) 0.4 MG SL tablet Place 1 tablet (0.4 mg total) under the tongue every 5 (five) minutes as needed for chest pain. (Patient taking differently: Place 0.4 mg under the tongue every 5 (five) minutes x 3 doses as needed for chest pain.) 25 tablet 3   No current facility-administered medications for this visit.    Physical Exam:     BP 132/80   Pulse 88   Ht '5\' 3"'$  (1.6 m)   Wt 174 lb (78.9 kg)   BMI 30.82 kg/m   GENERAL:  Pleasant female sitting in wheelchair in NAD PSYCH: : Cooperative, normal affect EENT: Nasal cannula in place.  mucous membranes moist CARDIAC:  RRR, murmur heard, no peripheral edema PULM: Normal respiratory effort, lungs CTA bilaterally, no wheezing ABDOMEN:  Nondistended, soft, nontender. NEURO: Alert and oriented x 3   IMPRESSION and PLAN:    1) Chronic constipation 2) Opioid induced constipation - Start Movantik.  Will start low dose 12.5 mg and increase to 25 mg if no change by 2 weeks - Hold Miralax x7-10 days after starting Movantik.  Can reintroduce MiraLAX as needed to maintain soft stools  3) Generalized abdominal pain Again discussed the multifactorial nature of her abdominal pain, to include OIC, possibly a vascular component (ultrasound with noncritical SMA stenosis), possibly a component of gastroparesis, etc.  Symptoms tend to improve with BM. - Evaluate for clinical improvement after  starting Movantik as above - Bentyl 10 mg prn every 6 hours as needed for abdominal pain - If continued pain, may consider repeat mesenteric ultrasound  4) Nausea No longer having any emesis.  Again discussed the multifactorial nature as above - Ok to increase Zofran to 4 mg every  8 hours as needed - Encouraged 4-6 small meals daily rather than traditional 3 meals/day - Continue boost, Ensure, etc. as meal replacement - Low-fat diet  5) CHF 6) CAD s/p CABG 7) COPD on home O2 8) CKD  9) Obesity Endoscopic studies reserved for more for emergent/therapeutic intent at this juncture given her significant comorbidities (i.e. no plan for screening studies)  Completed separate paperwork to chart for the rehabilitation facility.  Will also fax a copy of this note for the records.   [place] ,DO, FACG 04/08/2022, 10:16 AM

## 2022-04-08 NOTE — Patient Instructions (Addendum)
We have sent the following medications to your pharmacy for you to pick up at your convenience:  Movantik, Bentyl  Follow up with Dr. Silverio Decamp _______________________________________________________  If your blood pressure at your visit was 140/90 or greater, please contact your primary care physician to follow up on this.  _______________________________________________________  If you are age 78 or older, your body mass index should be between 23-30. Your Body mass index is 30.82 kg/m. If this is out of the aforementioned range listed, please consider follow up with your Primary Care Provider. __________________________________________________________  The [facility] providers would like to encourage you to use [facility] to communicate with providers for non-urgent requests or questions.  Due to long hold times on the telephone, sending your provider a message by [facility] may be a faster and more efficient way to get a response.  Please allow 48 business hours for a response.  Please remember that this is for non-urgent requests.   Due to recent changes in healthcare laws, you may see the results of your imaging and laboratory studies on MyChart before your provider has had a chance to review them.  We understand that in some cases there may be results that are confusing or concerning to you. Not all laboratory results come back in the same time frame and the provider may be waiting for multiple results in order to interpret others.  Please give [place] 48 hours in order for your provider to thoroughly review all the results before contacting the office for clarification of your results.   Thank you for choosing me and [facility].  Vito Cirigliano, D.O.

## 2022-04-09 ENCOUNTER — Other Ambulatory Visit (HOSPITAL_COMMUNITY): Payer: Self-pay | Admitting: *Deleted

## 2022-04-09 DIAGNOSIS — R262 Difficulty in walking, not elsewhere classified: Secondary | ICD-10-CM | POA: Diagnosis not present

## 2022-04-09 DIAGNOSIS — R2681 Unsteadiness on feet: Secondary | ICD-10-CM | POA: Diagnosis not present

## 2022-04-09 DIAGNOSIS — M6281 Muscle weakness (generalized): Secondary | ICD-10-CM | POA: Diagnosis not present

## 2022-04-09 DIAGNOSIS — Z4789 Encounter for other orthopedic aftercare: Secondary | ICD-10-CM | POA: Diagnosis not present

## 2022-04-09 DIAGNOSIS — Z741 Need for assistance with personal care: Secondary | ICD-10-CM | POA: Diagnosis not present

## 2022-04-10 ENCOUNTER — Ambulatory Visit (HOSPITAL_COMMUNITY)
Admission: RE | Admit: 2022-04-10 | Discharge: 2022-04-10 | Disposition: A | Discharge status: Home / Self Care | Payer: 59 | Source: Ambulatory Visit | Attending: Nephrology | Admitting: Nephrology

## 2022-04-10 VITALS — BP 144/83 | HR 88 | Temp 97.1°F | Resp 17

## 2022-04-10 DIAGNOSIS — D631 Anemia in chronic kidney disease: Secondary | ICD-10-CM | POA: Diagnosis not present

## 2022-04-10 DIAGNOSIS — R262 Difficulty in walking, not elsewhere classified: Secondary | ICD-10-CM | POA: Diagnosis not present

## 2022-04-10 DIAGNOSIS — Z4789 Encounter for other orthopedic aftercare: Secondary | ICD-10-CM | POA: Diagnosis not present

## 2022-04-10 DIAGNOSIS — Z741 Need for assistance with personal care: Secondary | ICD-10-CM | POA: Diagnosis not present

## 2022-04-10 DIAGNOSIS — M6281 Muscle weakness (generalized): Secondary | ICD-10-CM | POA: Diagnosis not present

## 2022-04-10 DIAGNOSIS — R2681 Unsteadiness on feet: Secondary | ICD-10-CM | POA: Diagnosis not present

## 2022-04-10 DIAGNOSIS — N185 Chronic kidney disease, stage 5: Secondary | ICD-10-CM | POA: Insufficient documentation

## 2022-04-10 LAB — POCT HEMOGLOBIN-HEMACUE: Hemoglobin: 9.7 g/dL — ABNORMAL LOW (ref 12.0–15.0)

## 2022-04-10 MED ORDER — EPOETIN ALFA-EPBX 10000 UNIT/ML IJ SOLN
INTRAMUSCULAR | Status: AC
  Filled 2022-04-10: qty 2

## 2022-04-10 MED ORDER — EPOETIN ALFA-EPBX 10000 UNIT/ML IJ SOLN
20000.0000 [IU] | INTRAMUSCULAR | Status: DC
  Administered 2022-04-10: 20000 [IU] via SUBCUTANEOUS

## 2022-04-11 DIAGNOSIS — R262 Difficulty in walking, not elsewhere classified: Secondary | ICD-10-CM | POA: Diagnosis not present

## 2022-04-11 DIAGNOSIS — R2689 Other abnormalities of gait and mobility: Secondary | ICD-10-CM | POA: Diagnosis not present

## 2022-04-11 DIAGNOSIS — M6281 Muscle weakness (generalized): Secondary | ICD-10-CM | POA: Diagnosis not present

## 2022-04-11 DIAGNOSIS — U071 COVID-19: Secondary | ICD-10-CM | POA: Diagnosis not present

## 2022-04-12 DIAGNOSIS — U071 COVID-19: Secondary | ICD-10-CM | POA: Diagnosis not present

## 2022-04-12 DIAGNOSIS — R262 Difficulty in walking, not elsewhere classified: Secondary | ICD-10-CM | POA: Diagnosis not present

## 2022-04-12 DIAGNOSIS — M6281 Muscle weakness (generalized): Secondary | ICD-10-CM | POA: Diagnosis not present

## 2022-04-12 DIAGNOSIS — R2689 Other abnormalities of gait and mobility: Secondary | ICD-10-CM | POA: Diagnosis not present

## 2022-04-13 IMAGING — DX DG CHEST 1V PORT
1 series · 1 of 1 positions shown · non-contrast
Comparison: 04/12/2021, 01/07/2021

CLINICAL DATA: Shortness of breath

EXAM:
PORTABLE CHEST 1 VIEW

[chest ap]
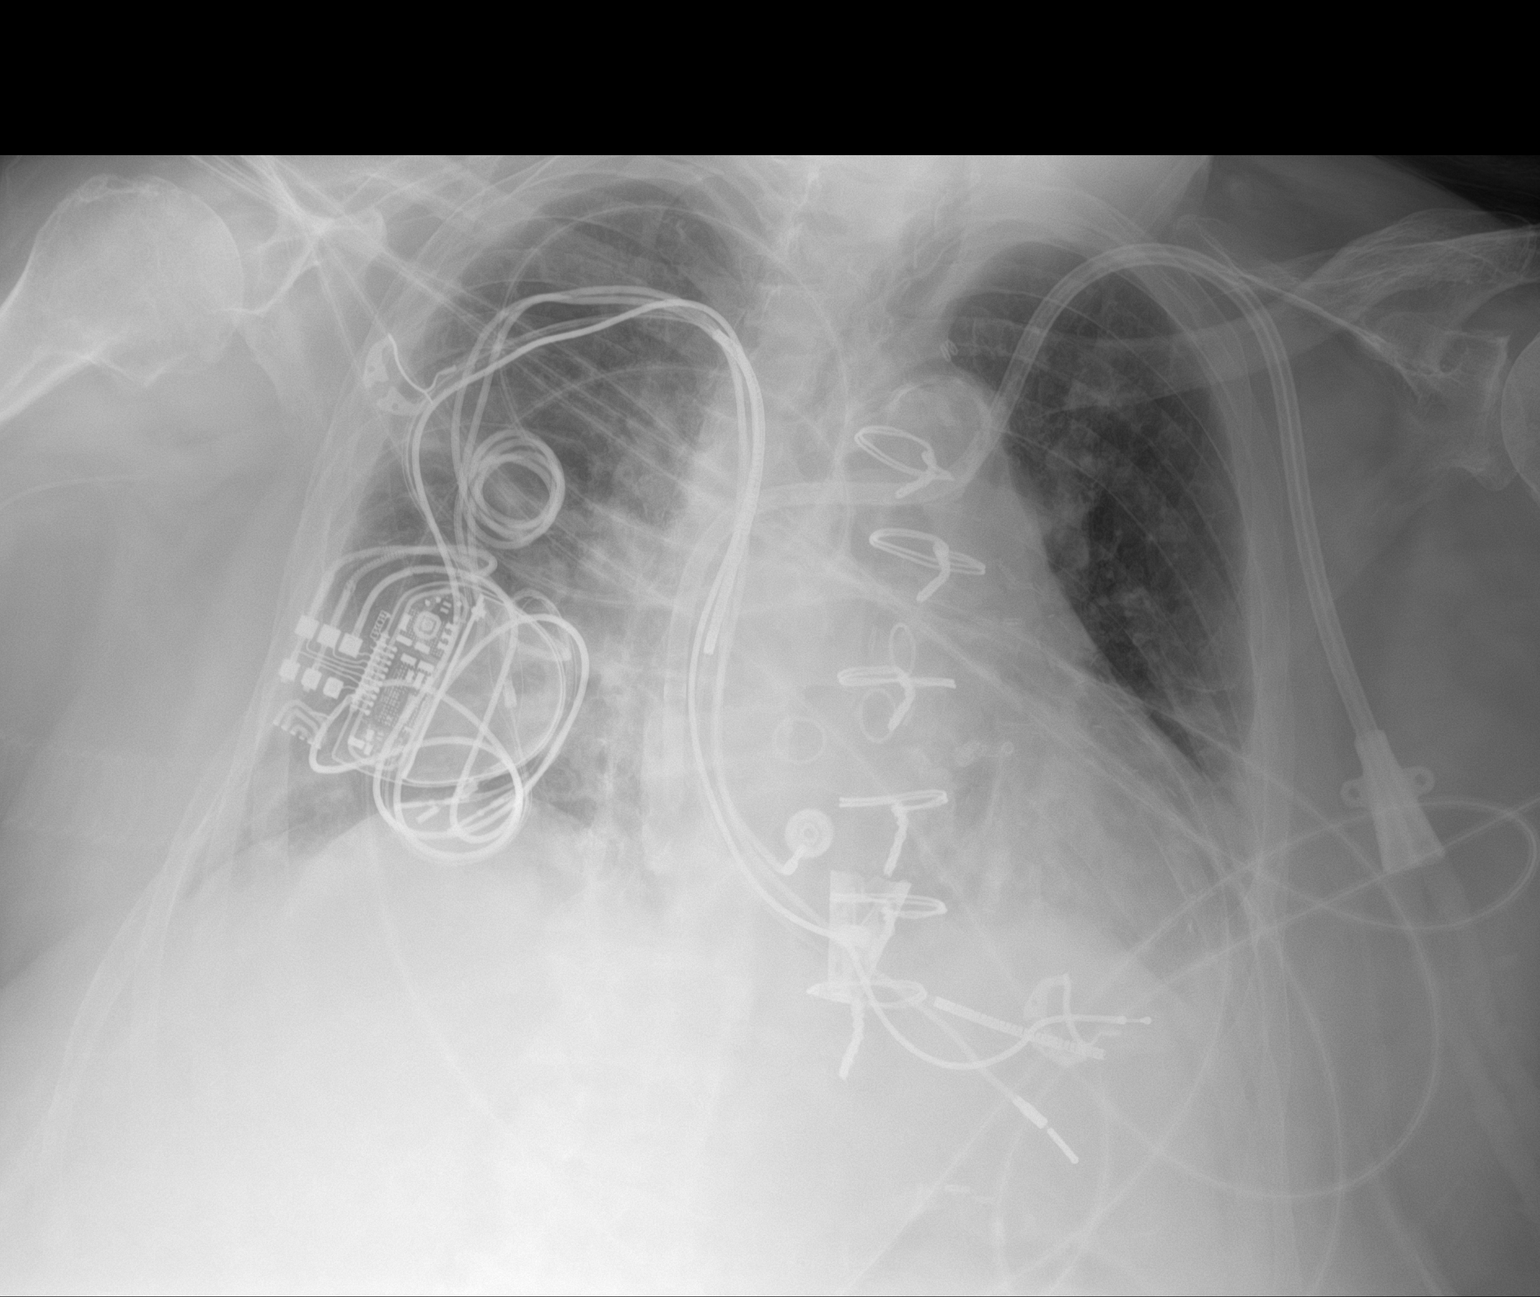

[1 of 1 positions shown; findings below may reference images not displayed]

FINDINGS: Post sternotomy changes. Left-sided central venous catheter tip over
the right atrium. Right-sided multi lead pacing device. Borderline
cardiomegaly with central vascular congestion and probable small
effusions. Subsegmental atelectasis left base. Aortic
atherosclerosis. No pneumothorax
IMPRESSION: 1. Cardiomegaly with mild central congestion and probable small
pleural effusions
2. Subsegmental atelectasis at the left base

## 2022-04-14 DIAGNOSIS — U071 COVID-19: Secondary | ICD-10-CM | POA: Diagnosis not present

## 2022-04-14 DIAGNOSIS — R2689 Other abnormalities of gait and mobility: Secondary | ICD-10-CM | POA: Diagnosis not present

## 2022-04-14 DIAGNOSIS — E1122 Type 2 diabetes mellitus with diabetic chronic kidney disease: Secondary | ICD-10-CM | POA: Diagnosis not present

## 2022-04-14 DIAGNOSIS — N184 Chronic kidney disease, stage 4 (severe): Secondary | ICD-10-CM | POA: Diagnosis not present

## 2022-04-14 DIAGNOSIS — I509 Heart failure, unspecified: Secondary | ICD-10-CM | POA: Diagnosis not present

## 2022-04-14 DIAGNOSIS — R262 Difficulty in walking, not elsewhere classified: Secondary | ICD-10-CM | POA: Diagnosis not present

## 2022-04-14 DIAGNOSIS — D631 Anemia in chronic kidney disease: Secondary | ICD-10-CM | POA: Diagnosis not present

## 2022-04-14 DIAGNOSIS — M6281 Muscle weakness (generalized): Secondary | ICD-10-CM | POA: Diagnosis not present

## 2022-04-14 DIAGNOSIS — N2581 Secondary hyperparathyroidism of renal origin: Secondary | ICD-10-CM | POA: Diagnosis not present

## 2022-04-14 DIAGNOSIS — I129 Hypertensive chronic kidney disease with stage 1 through stage 4 chronic kidney disease, or unspecified chronic kidney disease: Secondary | ICD-10-CM | POA: Diagnosis not present

## 2022-04-15 DIAGNOSIS — U071 COVID-19: Secondary | ICD-10-CM | POA: Diagnosis not present

## 2022-04-15 DIAGNOSIS — R2689 Other abnormalities of gait and mobility: Secondary | ICD-10-CM | POA: Diagnosis not present

## 2022-04-15 DIAGNOSIS — M6281 Muscle weakness (generalized): Secondary | ICD-10-CM | POA: Diagnosis not present

## 2022-04-15 DIAGNOSIS — R262 Difficulty in walking, not elsewhere classified: Secondary | ICD-10-CM | POA: Diagnosis not present

## 2022-04-16 DIAGNOSIS — M6281 Muscle weakness (generalized): Secondary | ICD-10-CM | POA: Diagnosis not present

## 2022-04-16 DIAGNOSIS — U071 COVID-19: Secondary | ICD-10-CM | POA: Diagnosis not present

## 2022-04-16 DIAGNOSIS — R2689 Other abnormalities of gait and mobility: Secondary | ICD-10-CM | POA: Diagnosis not present

## 2022-04-16 DIAGNOSIS — R262 Difficulty in walking, not elsewhere classified: Secondary | ICD-10-CM | POA: Diagnosis not present

## 2022-04-16 IMAGING — DX DG CHEST 1V PORT
1 series · 1 of 1 positions shown · non-contrast
Comparison: 04/19/2021 and prior radiographs

CLINICAL DATA: Shortness of breath

EXAM:
PORTABLE CHEST 1 VIEW

[chest]
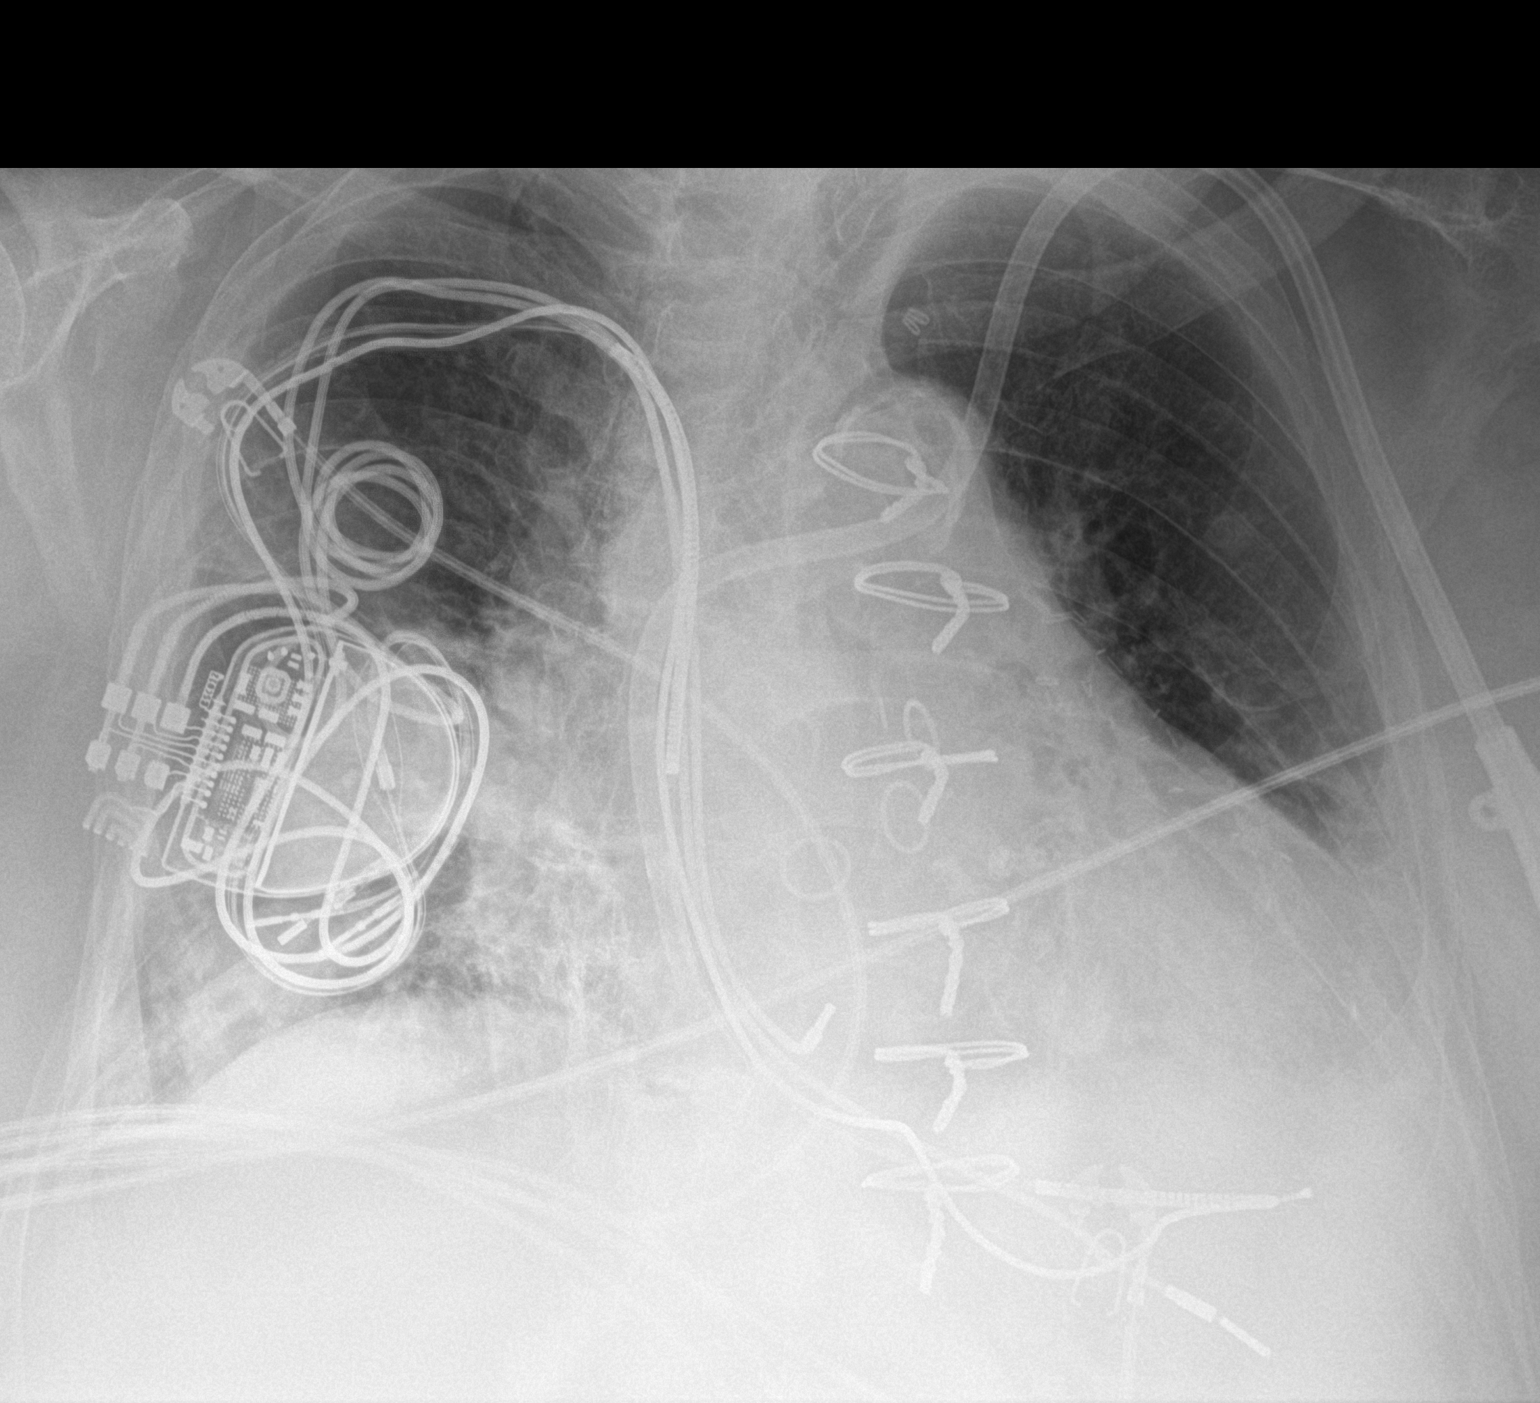

[1 of 1 positions shown; findings below may reference images not displayed]

FINDINGS: UPPER limits normal heart size and CABG changes again noted.

LEFT IJ central venous catheter and RIGHT pacemaker/AICD again
noted.

Bibasilar opacities are unchanged and may represent atelectasis.
There may be trace bilateral pleural effusions present.

There is no evidence of pneumothorax.
IMPRESSION: Unchanged appearance of the chest. Unchanged bibasilar opacities
which may represent atelectasis. Possible trace bilateral pleural
effusions.

## 2022-04-17 DIAGNOSIS — I5023 Acute on chronic systolic (congestive) heart failure: Secondary | ICD-10-CM | POA: Diagnosis not present

## 2022-04-17 DIAGNOSIS — E119 Type 2 diabetes mellitus without complications: Secondary | ICD-10-CM | POA: Diagnosis not present

## 2022-04-17 DIAGNOSIS — R2689 Other abnormalities of gait and mobility: Secondary | ICD-10-CM | POA: Diagnosis not present

## 2022-04-17 DIAGNOSIS — G8929 Other chronic pain: Secondary | ICD-10-CM | POA: Diagnosis not present

## 2022-04-17 DIAGNOSIS — I5043 Acute on chronic combined systolic (congestive) and diastolic (congestive) heart failure: Secondary | ICD-10-CM | POA: Diagnosis not present

## 2022-04-17 DIAGNOSIS — R410 Disorientation, unspecified: Secondary | ICD-10-CM | POA: Diagnosis not present

## 2022-04-17 DIAGNOSIS — I442 Atrioventricular block, complete: Secondary | ICD-10-CM | POA: Diagnosis not present

## 2022-04-17 DIAGNOSIS — I482 Chronic atrial fibrillation, unspecified: Secondary | ICD-10-CM | POA: Diagnosis not present

## 2022-04-17 DIAGNOSIS — K861 Other chronic pancreatitis: Secondary | ICD-10-CM | POA: Diagnosis not present

## 2022-04-17 DIAGNOSIS — R4701 Aphasia: Secondary | ICD-10-CM | POA: Diagnosis not present

## 2022-04-17 DIAGNOSIS — J90 Pleural effusion, not elsewhere classified: Secondary | ICD-10-CM | POA: Diagnosis not present

## 2022-04-17 DIAGNOSIS — K219 Gastro-esophageal reflux disease without esophagitis: Secondary | ICD-10-CM | POA: Diagnosis not present

## 2022-04-17 DIAGNOSIS — R7989 Other specified abnormal findings of blood chemistry: Secondary | ICD-10-CM | POA: Diagnosis not present

## 2022-04-17 DIAGNOSIS — E1122 Type 2 diabetes mellitus with diabetic chronic kidney disease: Secondary | ICD-10-CM | POA: Diagnosis not present

## 2022-04-17 DIAGNOSIS — I2510 Atherosclerotic heart disease of native coronary artery without angina pectoris: Secondary | ICD-10-CM | POA: Diagnosis not present

## 2022-04-17 DIAGNOSIS — J9611 Chronic respiratory failure with hypoxia: Secondary | ICD-10-CM | POA: Diagnosis not present

## 2022-04-17 DIAGNOSIS — I10 Essential (primary) hypertension: Secondary | ICD-10-CM | POA: Diagnosis not present

## 2022-04-17 DIAGNOSIS — U071 COVID-19: Secondary | ICD-10-CM | POA: Diagnosis not present

## 2022-04-17 DIAGNOSIS — Z743 Need for continuous supervision: Secondary | ICD-10-CM | POA: Diagnosis not present

## 2022-04-17 DIAGNOSIS — K589 Irritable bowel syndrome without diarrhea: Secondary | ICD-10-CM | POA: Diagnosis not present

## 2022-04-17 DIAGNOSIS — D696 Thrombocytopenia, unspecified: Secondary | ICD-10-CM | POA: Diagnosis not present

## 2022-04-17 DIAGNOSIS — Z1152 Encounter for screening for COVID-19: Secondary | ICD-10-CM | POA: Diagnosis not present

## 2022-04-17 DIAGNOSIS — M545 Low back pain, unspecified: Secondary | ICD-10-CM | POA: Diagnosis not present

## 2022-04-17 DIAGNOSIS — G9341 Metabolic encephalopathy: Secondary | ICD-10-CM | POA: Diagnosis not present

## 2022-04-17 DIAGNOSIS — D631 Anemia in chronic kidney disease: Secondary | ICD-10-CM | POA: Diagnosis not present

## 2022-04-17 DIAGNOSIS — R531 Weakness: Secondary | ICD-10-CM | POA: Diagnosis not present

## 2022-04-17 DIAGNOSIS — I5020 Unspecified systolic (congestive) heart failure: Secondary | ICD-10-CM | POA: Diagnosis not present

## 2022-04-17 DIAGNOSIS — I480 Paroxysmal atrial fibrillation: Secondary | ICD-10-CM | POA: Diagnosis not present

## 2022-04-17 DIAGNOSIS — I130 Hypertensive heart and chronic kidney disease with heart failure and stage 1 through stage 4 chronic kidney disease, or unspecified chronic kidney disease: Secondary | ICD-10-CM | POA: Diagnosis not present

## 2022-04-17 DIAGNOSIS — G4733 Obstructive sleep apnea (adult) (pediatric): Secondary | ICD-10-CM | POA: Diagnosis not present

## 2022-04-17 DIAGNOSIS — E669 Obesity, unspecified: Secondary | ICD-10-CM | POA: Diagnosis present

## 2022-04-17 DIAGNOSIS — D508 Other iron deficiency anemias: Secondary | ICD-10-CM | POA: Diagnosis not present

## 2022-04-17 DIAGNOSIS — R262 Difficulty in walking, not elsewhere classified: Secondary | ICD-10-CM | POA: Diagnosis not present

## 2022-04-17 DIAGNOSIS — K760 Fatty (change of) liver, not elsewhere classified: Secondary | ICD-10-CM | POA: Diagnosis not present

## 2022-04-17 DIAGNOSIS — B3781 Candidal esophagitis: Secondary | ICD-10-CM | POA: Diagnosis not present

## 2022-04-17 DIAGNOSIS — M6281 Muscle weakness (generalized): Secondary | ICD-10-CM | POA: Diagnosis not present

## 2022-04-17 DIAGNOSIS — N1832 Chronic kidney disease, stage 3b: Secondary | ICD-10-CM | POA: Diagnosis not present

## 2022-04-17 DIAGNOSIS — J45909 Unspecified asthma, uncomplicated: Secondary | ICD-10-CM | POA: Diagnosis not present

## 2022-04-17 IMAGING — CT CT ABD-PELV W/O CM
2 of 4 series · 16 of 46 positions shown, 18 images · non-contrast
Comparison: 09/04/2020

CLINICAL DATA: Generalized abdominal pain



[Series 3: ap without · axial · non-contrast · 0.95mm/px · z∈[+648,+1112]mm · 13 of 103 slices shown, 15 images]
[im 5/103  soft-tissue]
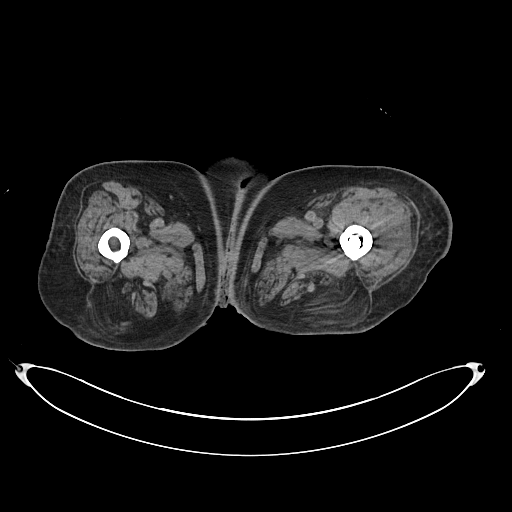
[im 5/103  bone]
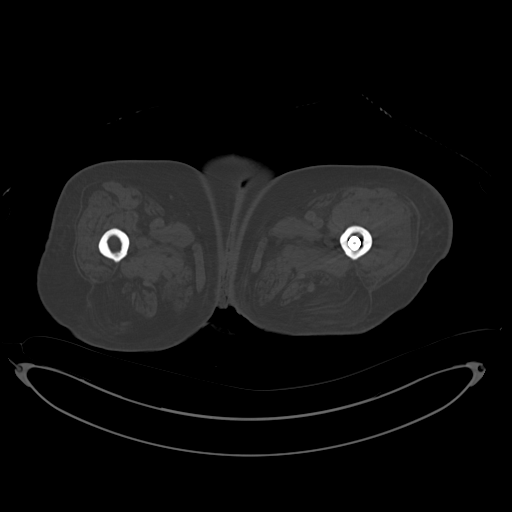
[im 15/103  soft-tissue]
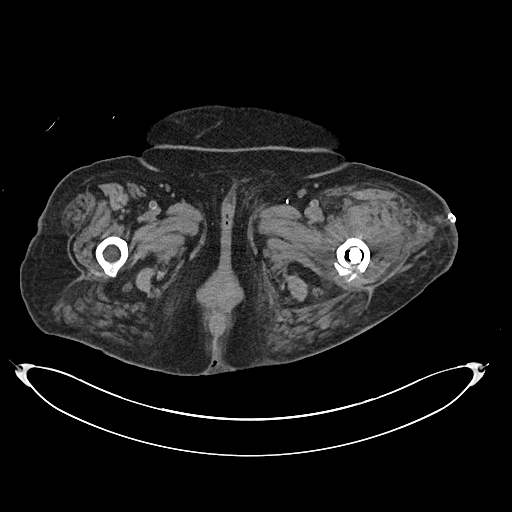
[im 20/103  soft-tissue]
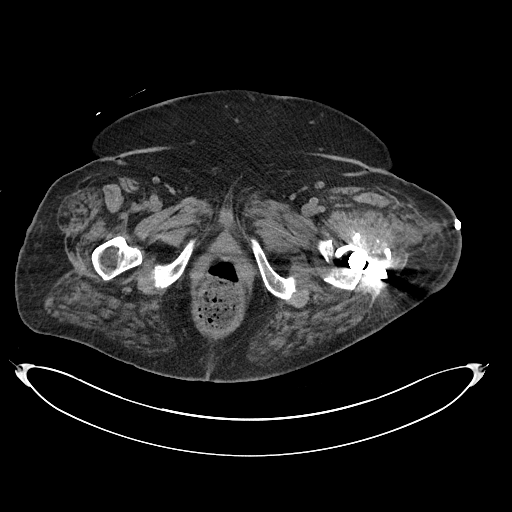
[im 30/103  soft-tissue]
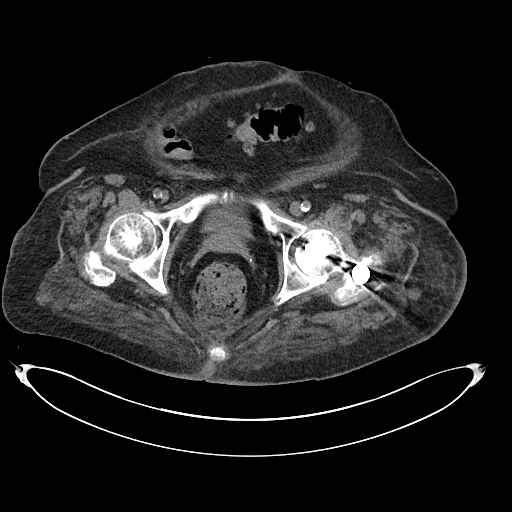
[im 35/103  soft-tissue]
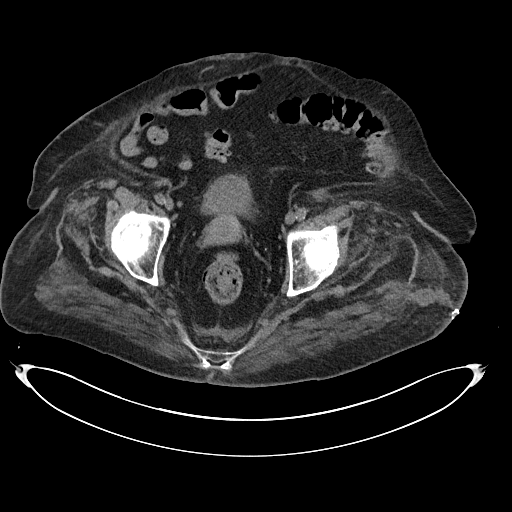
[im 44/103  soft-tissue]
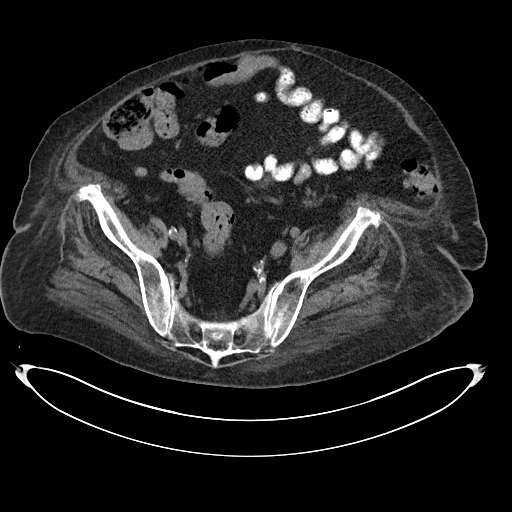
[im 54/103  soft-tissue]
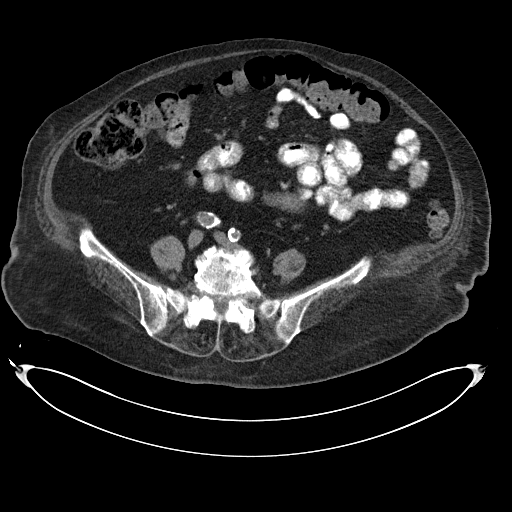
[im 59/103  soft-tissue]
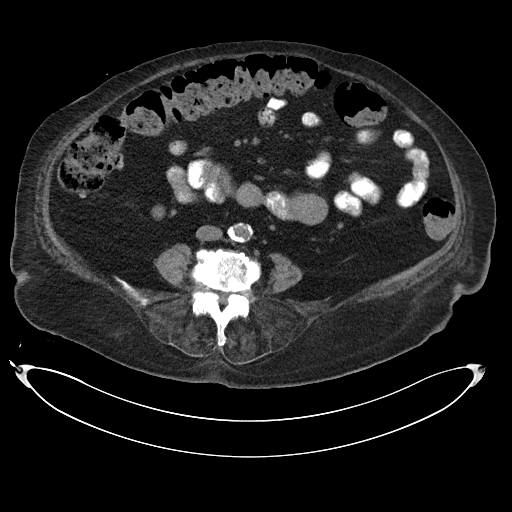
[im 69/103  soft-tissue]
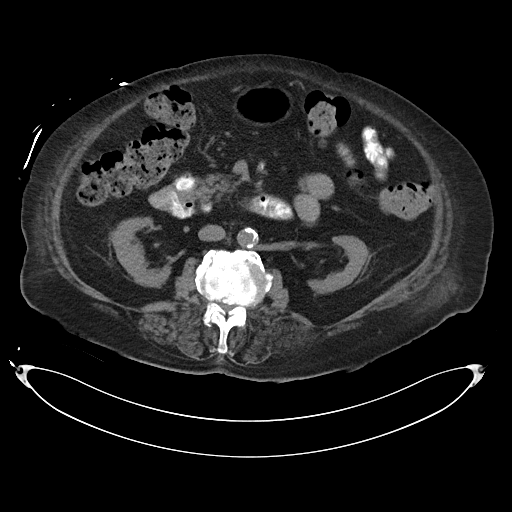
[im 69/103  bone]
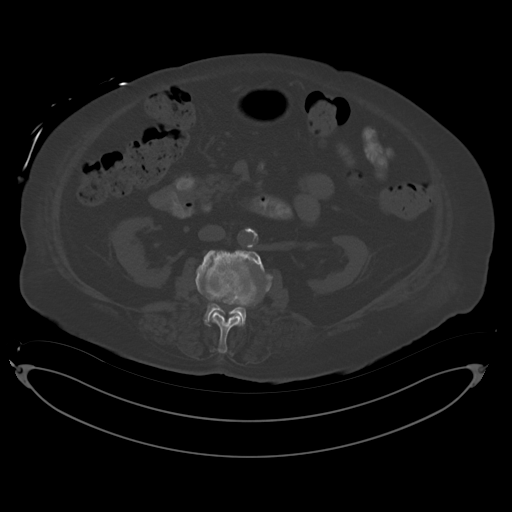
[im 73/103  soft-tissue]
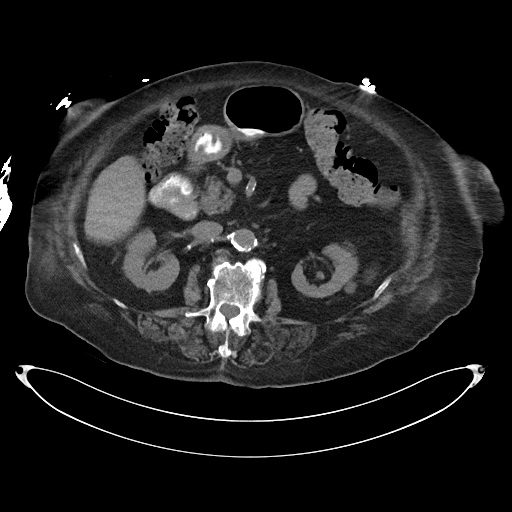
[im 83/103  soft-tissue]
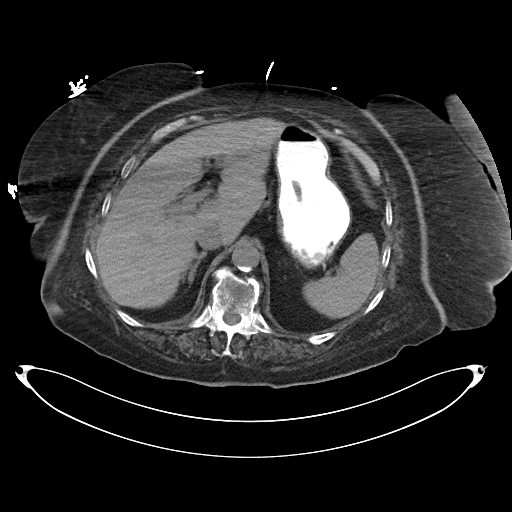
[im 88/103  soft-tissue]
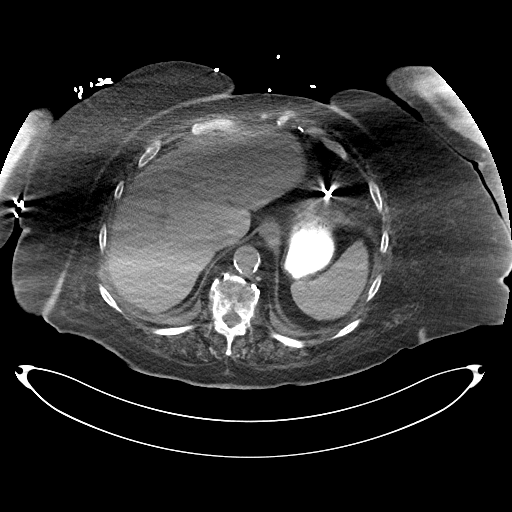
[im 98/103  soft-tissue]
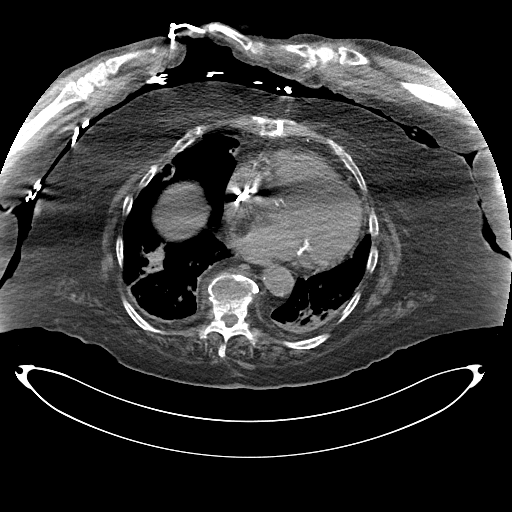

[Series 6: cor · coronal · 0.88mm/px · 3 of 120 slices shown]
[im 40/120  soft-tissue]
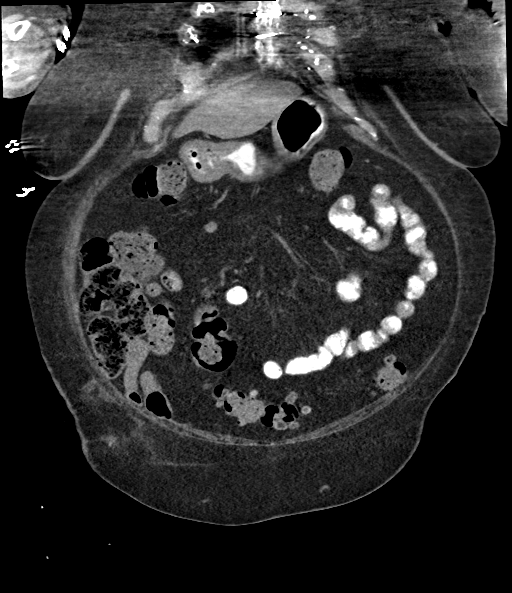
[im 53/120  soft-tissue]
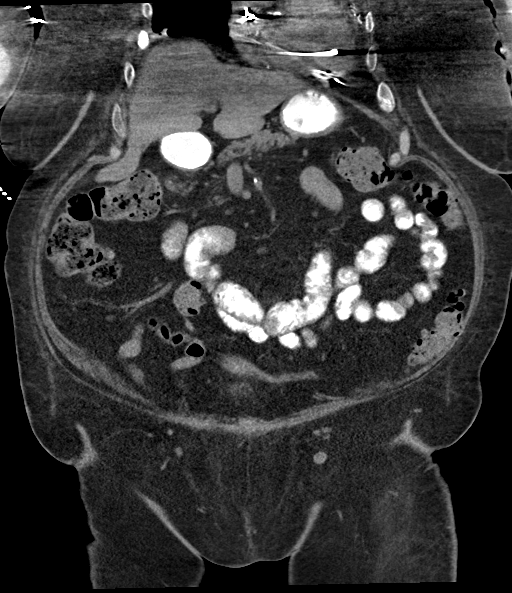
[im 67/120  soft-tissue]
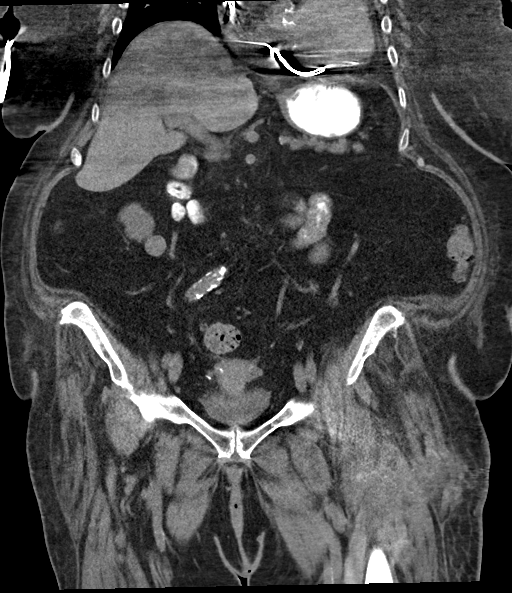

[16 of 46 positions shown; findings below may reference images not displayed]

FINDINGS: Lower chest: Bibasilar opacities, likely atelectasis. Trace
bilateral effusions. Pacer wires in the right heart. Aortic
atherosclerosis.

Hepatobiliary: No focal liver abnormality is seen. Status post
cholecystectomy. No biliary dilatation.

Pancreas: No focal abnormality or ductal dilatation.

Spleen: No focal abnormality.  Normal size.

Adrenals/Urinary Tract: Small exophytic lesion off the lower pole of
the right kidney measures 1.5 cm, stable since prior study, likely
hemorrhagic cyst. Small exophytic cyst off the midpole of the left
kidney, unchanged. No hydronephrosis. No renal or ureteral stones.
Adrenal glands and urinary bladder unremarkable.

Stomach/Bowel: Colonic diverticulosis. Moderate stool burden
throughout the colon. Stomach and small bowel decompressed.

Vascular/Lymphatic: Aortic atherosclerosis.

Reproductive: Uterus and adnexa unremarkable.  No mass.

Other: No free fluid or free air.

Musculoskeletal: No acute bony abnormality. Diffuse degenerative
changes throughout the lumbar spine. Left femoral intertrochanteric
fracture status post internal fixation.
IMPRESSION: No acute findings in the abdomen or pelvis.

Trace bilateral effusions, bibasilar atelectasis.

Aortic atherosclerosis.

Colonic diverticulosis.

## 2022-04-17 IMAGING — DX DG THORACIC SPINE 2V
3 series · 3 of 3 positions shown · non-contrast
Comparison: None.

CLINICAL DATA: Fall.  Thoracic back pain.

EXAM:
THORACIC SPINE 2 VIEWS

[t-spine ap]
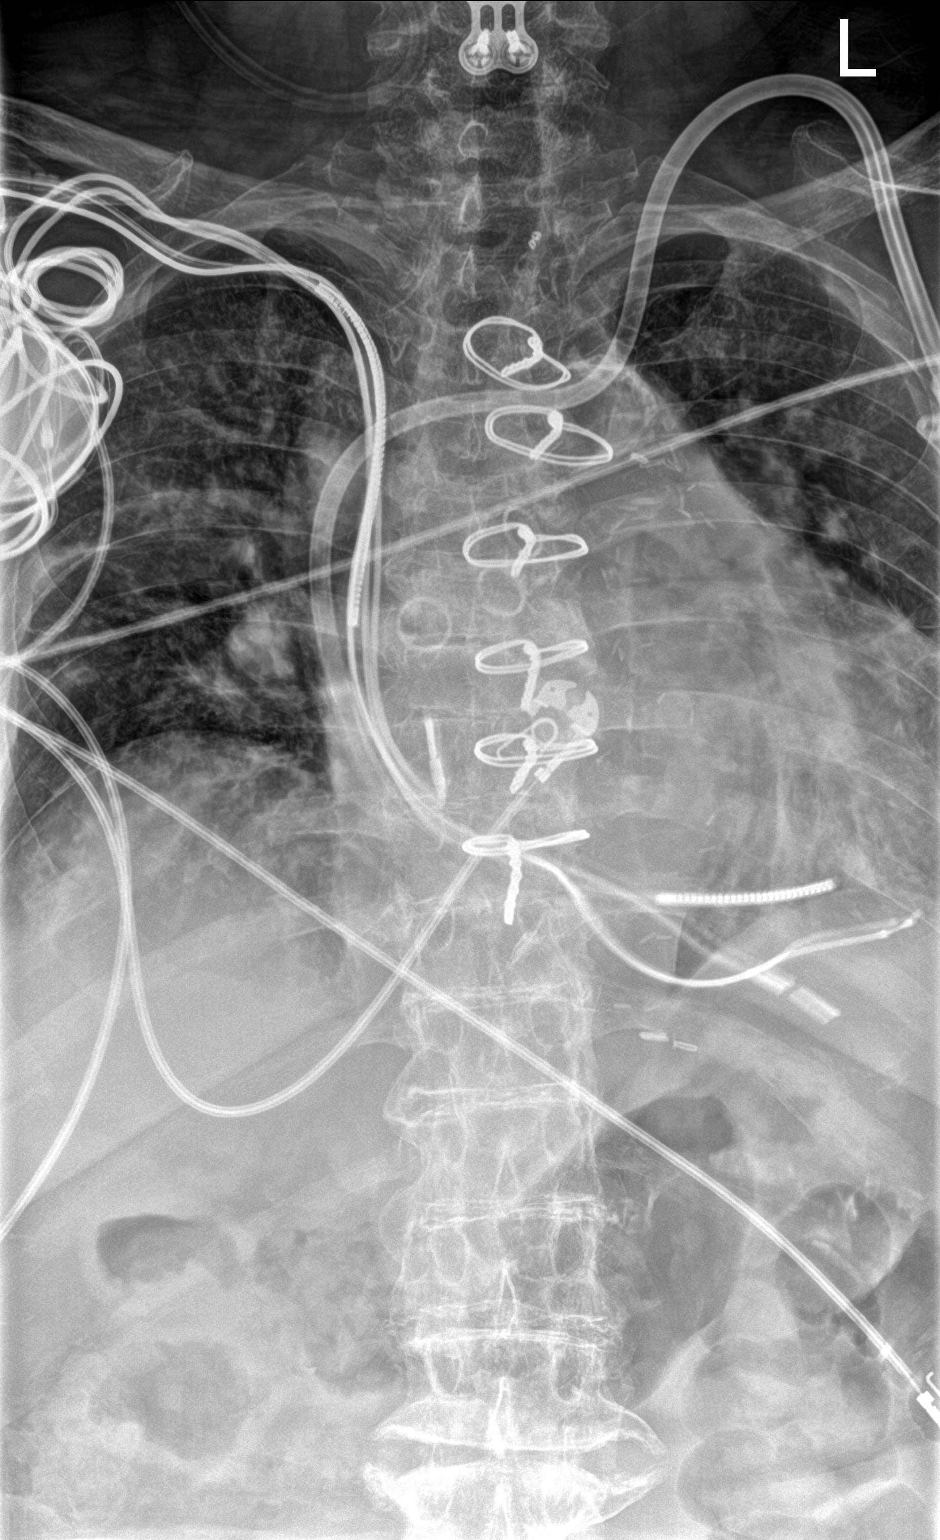

[t-spine swimmers]
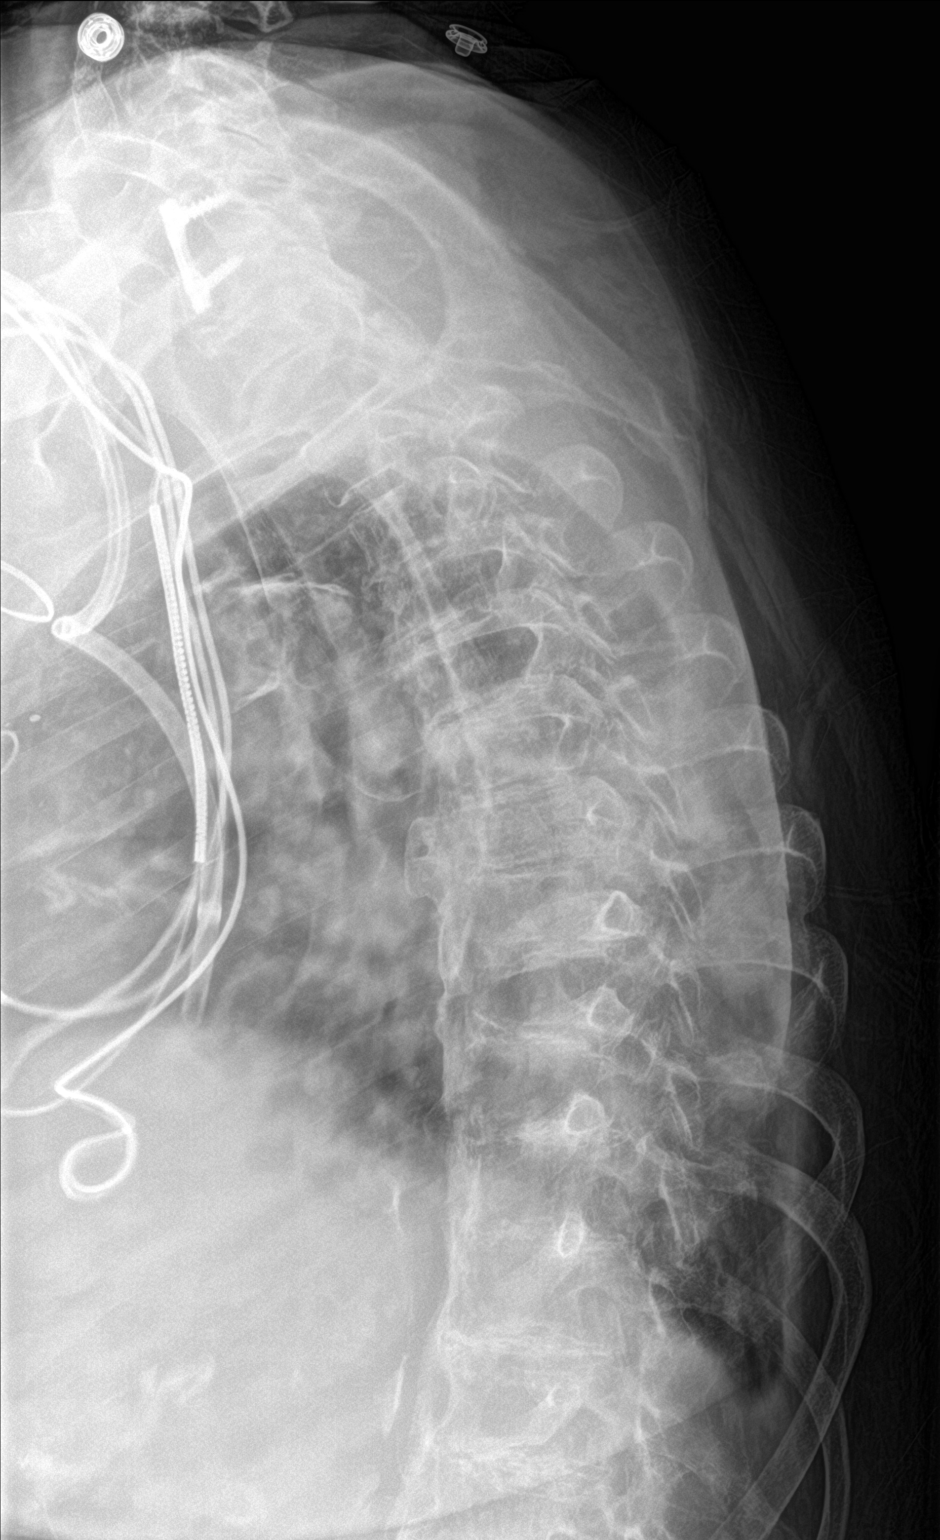

[t-spine lat]
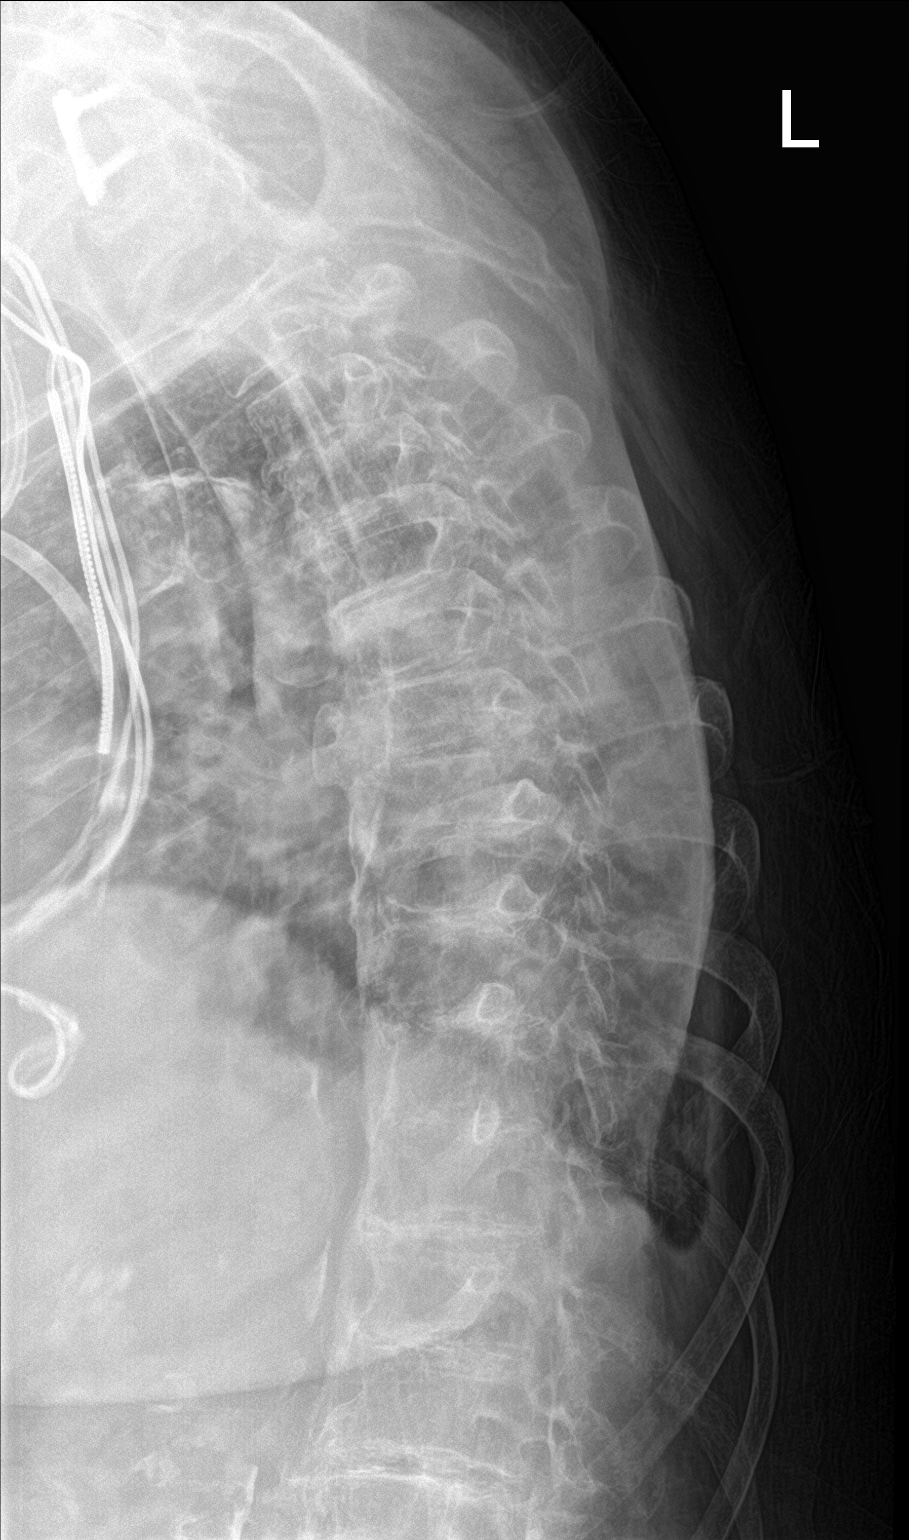

[3 of 3 positions shown; findings below may reference images not displayed]

FINDINGS: There is no evidence of thoracic spine fracture. Alignment is
normal. Diffuse intervertebral ankylosis is again seen throughout
the thoracic spine. Generalized osteopenia noted. Cervical spine
fusion hardware again demonstrated.
IMPRESSION: No acute findings.

Diffuse thoracic intervertebral ankylosis and osteopenia.

## 2022-04-18 DIAGNOSIS — R2689 Other abnormalities of gait and mobility: Secondary | ICD-10-CM | POA: Diagnosis not present

## 2022-04-18 DIAGNOSIS — R262 Difficulty in walking, not elsewhere classified: Secondary | ICD-10-CM | POA: Diagnosis not present

## 2022-04-18 DIAGNOSIS — M6281 Muscle weakness (generalized): Secondary | ICD-10-CM | POA: Diagnosis not present

## 2022-04-18 DIAGNOSIS — U071 COVID-19: Secondary | ICD-10-CM | POA: Diagnosis not present

## 2022-04-19 IMAGING — DX DG CHEST 1V PORT
1 series · 1 of 1 positions shown · non-contrast
Comparison: Chest x-ray dated April 20, 2021

CLINICAL DATA: Shortness of breath

EXAM:
PORTABLE CHEST 1 VIEW

[chest ap]
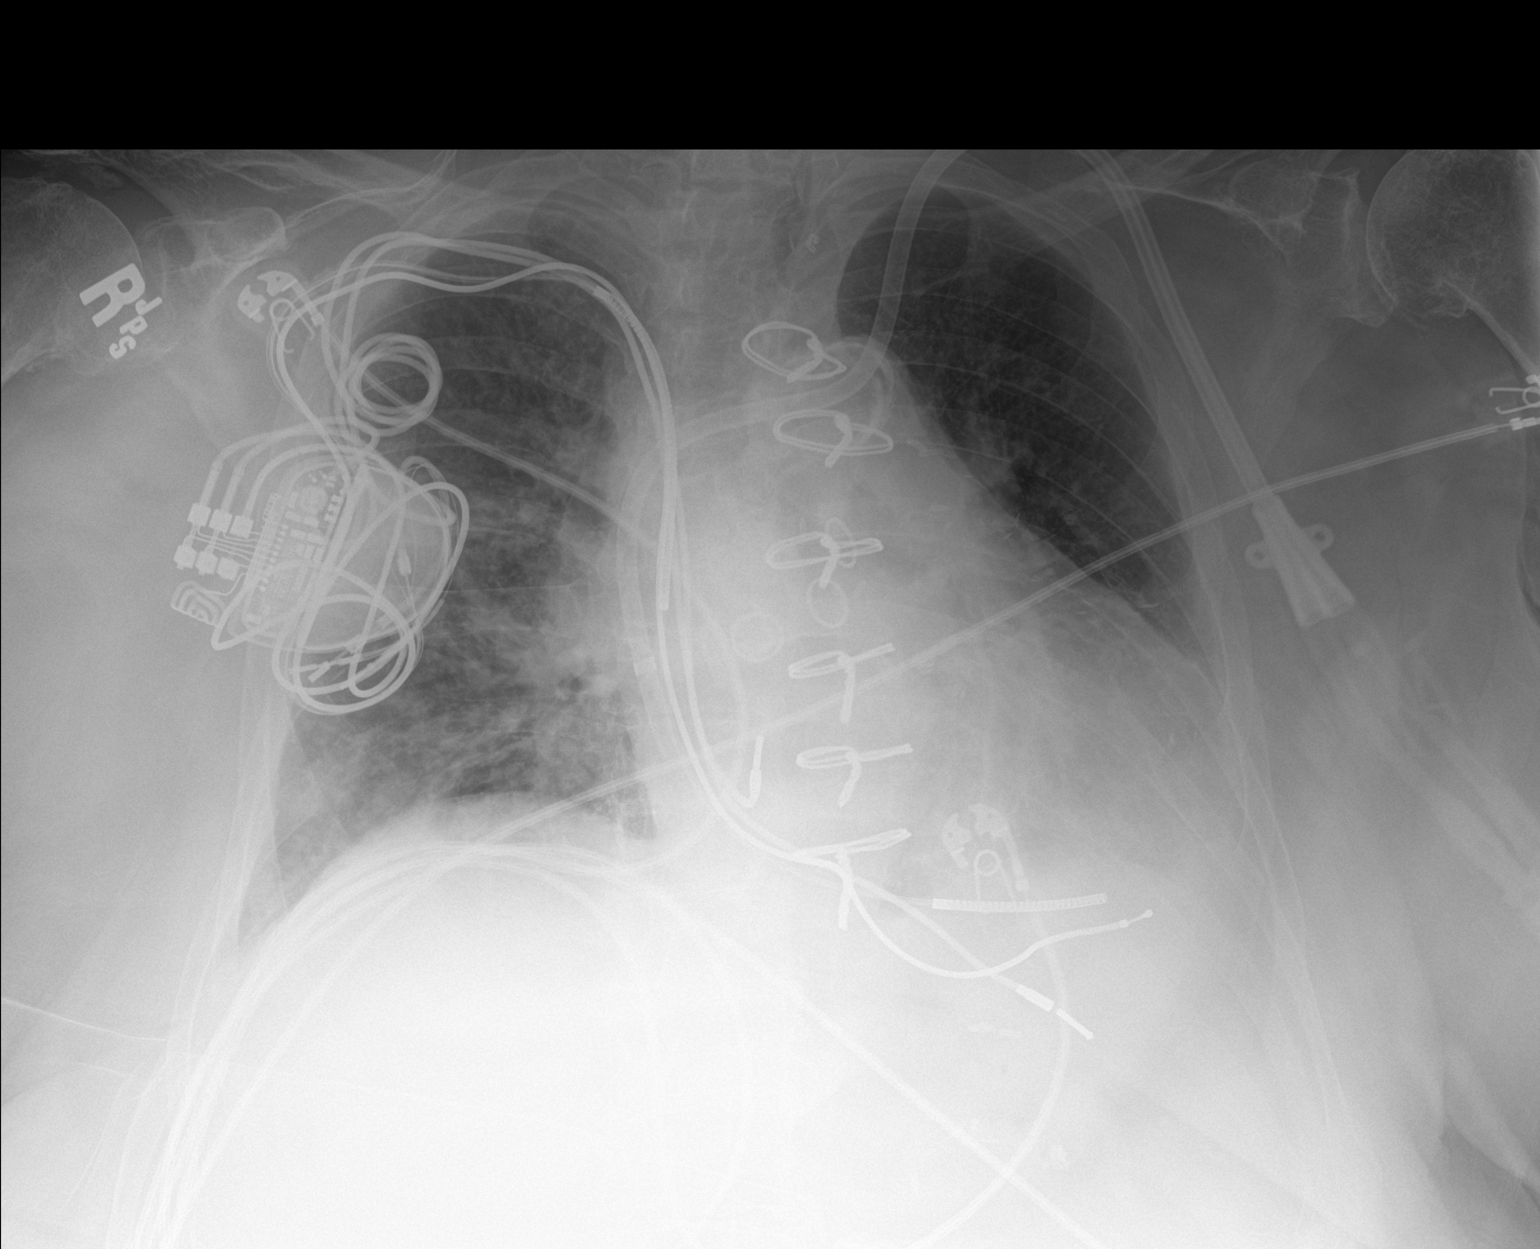

[1 of 1 positions shown; findings below may reference images not displayed]

FINDINGS: Cardiac and mediastinal contours are unchanged post median
sternotomy and CABG. Right chest wall AICD with unchanged lead
position. Left chest wall dialysis catheter with tip in the right
atrium. Lower lung predominant linear opacities which are similar to
multiple prior exams and likely due to scarring or atelectasis. No
large pleural effusion or pneumothorax.
IMPRESSION: No new parenchymal opacity.

## 2022-04-21 ENCOUNTER — Encounter (HOSPITAL_COMMUNITY): Payer: Self-pay | Admitting: Emergency Medicine

## 2022-04-21 ENCOUNTER — Emergency Department (HOSPITAL_COMMUNITY): Payer: 59

## 2022-04-21 ENCOUNTER — Other Ambulatory Visit: Payer: Self-pay

## 2022-04-21 ENCOUNTER — Inpatient Hospital Stay (HOSPITAL_COMMUNITY)
Admission: EM | Admit: 2022-04-21 | Discharge: 2022-04-25 | DRG: 070 | Disposition: A | Discharge status: Skilled Nursing Facility | Payer: 59 | Source: Skilled Nursing Facility | Attending: Internal Medicine | Admitting: Internal Medicine

## 2022-04-21 DIAGNOSIS — U071 COVID-19: Secondary | ICD-10-CM | POA: Diagnosis not present

## 2022-04-21 DIAGNOSIS — E119 Type 2 diabetes mellitus without complications: Secondary | ICD-10-CM

## 2022-04-21 DIAGNOSIS — E1122 Type 2 diabetes mellitus with diabetic chronic kidney disease: Secondary | ICD-10-CM | POA: Diagnosis not present

## 2022-04-21 DIAGNOSIS — D631 Anemia in chronic kidney disease: Secondary | ICD-10-CM | POA: Diagnosis not present

## 2022-04-21 DIAGNOSIS — Z79899 Other long term (current) drug therapy: Secondary | ICD-10-CM

## 2022-04-21 DIAGNOSIS — I2510 Atherosclerotic heart disease of native coronary artery without angina pectoris: Secondary | ICD-10-CM

## 2022-04-21 DIAGNOSIS — E7800 Pure hypercholesterolemia, unspecified: Secondary | ICD-10-CM | POA: Diagnosis present

## 2022-04-21 DIAGNOSIS — Z8719 Personal history of other diseases of the digestive system: Secondary | ICD-10-CM

## 2022-04-21 DIAGNOSIS — G4733 Obstructive sleep apnea (adult) (pediatric): Secondary | ICD-10-CM | POA: Diagnosis present

## 2022-04-21 DIAGNOSIS — R531 Weakness: Secondary | ICD-10-CM | POA: Diagnosis not present

## 2022-04-21 DIAGNOSIS — I442 Atrioventricular block, complete: Secondary | ICD-10-CM | POA: Diagnosis not present

## 2022-04-21 DIAGNOSIS — D696 Thrombocytopenia, unspecified: Secondary | ICD-10-CM | POA: Diagnosis present

## 2022-04-21 DIAGNOSIS — K589 Irritable bowel syndrome without diarrhea: Secondary | ICD-10-CM | POA: Diagnosis not present

## 2022-04-21 DIAGNOSIS — Z993 Dependence on wheelchair: Secondary | ICD-10-CM

## 2022-04-21 DIAGNOSIS — Z955 Presence of coronary angioplasty implant and graft: Secondary | ICD-10-CM

## 2022-04-21 DIAGNOSIS — I482 Chronic atrial fibrillation, unspecified: Secondary | ICD-10-CM | POA: Diagnosis present

## 2022-04-21 DIAGNOSIS — Z7902 Long term (current) use of antithrombotics/antiplatelets: Secondary | ICD-10-CM

## 2022-04-21 DIAGNOSIS — J45909 Unspecified asthma, uncomplicated: Secondary | ICD-10-CM | POA: Diagnosis not present

## 2022-04-21 DIAGNOSIS — J9611 Chronic respiratory failure with hypoxia: Secondary | ICD-10-CM | POA: Diagnosis present

## 2022-04-21 DIAGNOSIS — K760 Fatty (change of) liver, not elsewhere classified: Secondary | ICD-10-CM | POA: Diagnosis not present

## 2022-04-21 DIAGNOSIS — I6523 Occlusion and stenosis of bilateral carotid arteries: Secondary | ICD-10-CM | POA: Diagnosis not present

## 2022-04-21 DIAGNOSIS — M6281 Muscle weakness (generalized): Secondary | ICD-10-CM | POA: Diagnosis not present

## 2022-04-21 DIAGNOSIS — I10 Essential (primary) hypertension: Secondary | ICD-10-CM | POA: Diagnosis not present

## 2022-04-21 DIAGNOSIS — Z9861 Coronary angioplasty status: Secondary | ICD-10-CM

## 2022-04-21 DIAGNOSIS — R4701 Aphasia: Secondary | ICD-10-CM | POA: Diagnosis not present

## 2022-04-21 DIAGNOSIS — I5020 Unspecified systolic (congestive) heart failure: Secondary | ICD-10-CM | POA: Diagnosis present

## 2022-04-21 DIAGNOSIS — T502X5A Adverse effect of carbonic-anhydrase inhibitors, benzothiadiazides and other diuretics, initial encounter: Secondary | ICD-10-CM | POA: Diagnosis not present

## 2022-04-21 DIAGNOSIS — Z8673 Personal history of transient ischemic attack (TIA), and cerebral infarction without residual deficits: Secondary | ICD-10-CM

## 2022-04-21 DIAGNOSIS — K219 Gastro-esophageal reflux disease without esophagitis: Secondary | ICD-10-CM | POA: Diagnosis present

## 2022-04-21 DIAGNOSIS — Z833 Family history of diabetes mellitus: Secondary | ICD-10-CM

## 2022-04-21 DIAGNOSIS — Z91013 Allergy to seafood: Secondary | ICD-10-CM

## 2022-04-21 DIAGNOSIS — Z1152 Encounter for screening for COVID-19: Secondary | ICD-10-CM | POA: Diagnosis not present

## 2022-04-21 DIAGNOSIS — Z881 Allergy status to other antibiotic agents status: Secondary | ICD-10-CM

## 2022-04-21 DIAGNOSIS — N183 Chronic kidney disease, stage 3 unspecified: Secondary | ICD-10-CM | POA: Diagnosis present

## 2022-04-21 DIAGNOSIS — N1832 Chronic kidney disease, stage 3b: Secondary | ICD-10-CM | POA: Diagnosis present

## 2022-04-21 DIAGNOSIS — Z9851 Tubal ligation status: Secondary | ICD-10-CM

## 2022-04-21 DIAGNOSIS — J90 Pleural effusion, not elsewhere classified: Secondary | ICD-10-CM | POA: Diagnosis not present

## 2022-04-21 DIAGNOSIS — Z882 Allergy status to sulfonamides status: Secondary | ICD-10-CM

## 2022-04-21 DIAGNOSIS — E876 Hypokalemia: Secondary | ICD-10-CM | POA: Diagnosis not present

## 2022-04-21 DIAGNOSIS — Z8249 Family history of ischemic heart disease and other diseases of the circulatory system: Secondary | ICD-10-CM

## 2022-04-21 DIAGNOSIS — I5043 Acute on chronic combined systolic (congestive) and diastolic (congestive) heart failure: Secondary | ICD-10-CM | POA: Diagnosis present

## 2022-04-21 DIAGNOSIS — Z743 Need for continuous supervision: Secondary | ICD-10-CM | POA: Diagnosis not present

## 2022-04-21 DIAGNOSIS — Z9981 Dependence on supplemental oxygen: Secondary | ICD-10-CM

## 2022-04-21 DIAGNOSIS — R7989 Other specified abnormal findings of blood chemistry: Secondary | ICD-10-CM | POA: Diagnosis present

## 2022-04-21 DIAGNOSIS — Z825 Family history of asthma and other chronic lower respiratory diseases: Secondary | ICD-10-CM

## 2022-04-21 DIAGNOSIS — B3781 Candidal esophagitis: Secondary | ICD-10-CM | POA: Diagnosis present

## 2022-04-21 DIAGNOSIS — Z87891 Personal history of nicotine dependence: Secondary | ICD-10-CM

## 2022-04-21 DIAGNOSIS — E669 Obesity, unspecified: Secondary | ICD-10-CM | POA: Diagnosis present

## 2022-04-21 DIAGNOSIS — G9341 Metabolic encephalopathy: Secondary | ICD-10-CM | POA: Diagnosis not present

## 2022-04-21 DIAGNOSIS — D649 Anemia, unspecified: Secondary | ICD-10-CM | POA: Diagnosis present

## 2022-04-21 DIAGNOSIS — Z5982 Transportation insecurity: Secondary | ICD-10-CM

## 2022-04-21 DIAGNOSIS — I5023 Acute on chronic systolic (congestive) heart failure: Secondary | ICD-10-CM | POA: Diagnosis present

## 2022-04-21 DIAGNOSIS — Z888 Allergy status to other drugs, medicaments and biological substances status: Secondary | ICD-10-CM

## 2022-04-21 DIAGNOSIS — Z7951 Long term (current) use of inhaled steroids: Secondary | ICD-10-CM

## 2022-04-21 DIAGNOSIS — K861 Other chronic pancreatitis: Secondary | ICD-10-CM | POA: Diagnosis not present

## 2022-04-21 DIAGNOSIS — I4891 Unspecified atrial fibrillation: Secondary | ICD-10-CM | POA: Diagnosis present

## 2022-04-21 DIAGNOSIS — I480 Paroxysmal atrial fibrillation: Secondary | ICD-10-CM | POA: Diagnosis present

## 2022-04-21 DIAGNOSIS — I639 Cerebral infarction, unspecified: Secondary | ICD-10-CM | POA: Diagnosis not present

## 2022-04-21 DIAGNOSIS — I130 Hypertensive heart and chronic kidney disease with heart failure and stage 1 through stage 4 chronic kidney disease, or unspecified chronic kidney disease: Secondary | ICD-10-CM | POA: Diagnosis present

## 2022-04-21 DIAGNOSIS — Z7982 Long term (current) use of aspirin: Secondary | ICD-10-CM

## 2022-04-21 DIAGNOSIS — Z6829 Body mass index (BMI) 29.0-29.9, adult: Secondary | ICD-10-CM

## 2022-04-21 DIAGNOSIS — Z91041 Radiographic dye allergy status: Secondary | ICD-10-CM

## 2022-04-21 DIAGNOSIS — R4182 Altered mental status, unspecified: Secondary | ICD-10-CM

## 2022-04-21 DIAGNOSIS — R262 Difficulty in walking, not elsewhere classified: Secondary | ICD-10-CM | POA: Diagnosis not present

## 2022-04-21 DIAGNOSIS — G8929 Other chronic pain: Secondary | ICD-10-CM | POA: Diagnosis not present

## 2022-04-21 DIAGNOSIS — R410 Disorientation, unspecified: Secondary | ICD-10-CM | POA: Diagnosis not present

## 2022-04-21 DIAGNOSIS — D508 Other iron deficiency anemias: Secondary | ICD-10-CM

## 2022-04-21 DIAGNOSIS — Z981 Arthrodesis status: Secondary | ICD-10-CM

## 2022-04-21 DIAGNOSIS — M545 Low back pain, unspecified: Secondary | ICD-10-CM | POA: Diagnosis present

## 2022-04-21 DIAGNOSIS — K8681 Exocrine pancreatic insufficiency: Secondary | ICD-10-CM | POA: Diagnosis present

## 2022-04-21 DIAGNOSIS — R2689 Other abnormalities of gait and mobility: Secondary | ICD-10-CM | POA: Diagnosis not present

## 2022-04-21 DIAGNOSIS — Z95810 Presence of automatic (implantable) cardiac defibrillator: Secondary | ICD-10-CM

## 2022-04-21 DIAGNOSIS — E11621 Type 2 diabetes mellitus with foot ulcer: Secondary | ICD-10-CM

## 2022-04-21 LAB — CBC WITH DIFFERENTIAL/PLATELET
Abs Immature Granulocytes: 0.02 10*3/uL (ref 0.00–0.07)
Basophils Absolute: 0 10*3/uL (ref 0.0–0.1)
Basophils Relative: 1 %
Eosinophils Absolute: 0.2 10*3/uL (ref 0.0–0.5)
Eosinophils Relative: 3 %
HCT: 37.5 % (ref 36.0–46.0)
Hemoglobin: 11.3 g/dL — ABNORMAL LOW (ref 12.0–15.0)
Immature Granulocytes: 0 %
Lymphocytes Relative: 15 %
Lymphs Abs: 1.1 10*3/uL (ref 0.7–4.0)
MCH: 28 pg (ref 26.0–34.0)
MCHC: 30.1 g/dL (ref 30.0–36.0)
MCV: 92.8 fL (ref 80.0–100.0)
Monocytes Absolute: 0.4 10*3/uL (ref 0.1–1.0)
Monocytes Relative: 6 %
Neutro Abs: 5.2 10*3/uL (ref 1.7–7.7)
Neutrophils Relative %: 75 %
Platelets: 124 10*3/uL — ABNORMAL LOW (ref 150–400)
RBC: 4.04 MIL/uL (ref 3.87–5.11)
RDW: 17.2 % — ABNORMAL HIGH (ref 11.5–15.5)
WBC: 6.9 10*3/uL (ref 4.0–10.5)
nRBC: 0 % (ref 0.0–0.2)

## 2022-04-21 LAB — LACTIC ACID, PLASMA: Lactic Acid, Venous: 1.4 mmol/L (ref 0.5–1.9)

## 2022-04-21 LAB — LIPASE, BLOOD: Lipase: 198 U/L — ABNORMAL HIGH (ref 11–51)

## 2022-04-21 LAB — COMPREHENSIVE METABOLIC PANEL
ALT: 40 U/L (ref 0–44)
AST: 56 U/L — ABNORMAL HIGH (ref 15–41)
Albumin: 2.4 g/dL — ABNORMAL LOW (ref 3.5–5.0)
Alkaline Phosphatase: 356 U/L — ABNORMAL HIGH (ref 38–126)
Anion gap: 7 (ref 5–15)
BUN: 28 mg/dL — ABNORMAL HIGH (ref 8–23)
CO2: 18 mmol/L — ABNORMAL LOW (ref 22–32)
Calcium: 10.1 mg/dL (ref 8.9–10.3)
Chloride: 110 mmol/L (ref 98–111)
Creatinine, Ser: 2.05 mg/dL — ABNORMAL HIGH (ref 0.44–1.00)
GFR, Estimated: 24 mL/min — ABNORMAL LOW (ref >60)
Glucose, Bld: 154 mg/dL — ABNORMAL HIGH (ref 70–99)
Potassium: 5 mmol/L (ref 3.5–5.1)
Sodium: 135 mmol/L (ref 135–145)
Total Bilirubin: 0.5 mg/dL (ref 0.3–1.2)
Total Protein: 5.7 g/dL — ABNORMAL LOW (ref 6.5–8.1)

## 2022-04-21 LAB — RESP PANEL BY RT-PCR (RSV, FLU A&B, COVID)  RVPGX2
Influenza A by PCR: NEGATIVE
Influenza B by PCR: NEGATIVE
Resp Syncytial Virus by PCR: NEGATIVE
SARS Coronavirus 2 by RT PCR: POSITIVE — AB

## 2022-04-21 LAB — TROPONIN I (HIGH SENSITIVITY)
Troponin I (High Sensitivity): 158 ng/L (ref <18)
Troponin I (High Sensitivity): 164 ng/L (ref <18)

## 2022-04-21 LAB — CBG MONITORING, ED: Glucose-Capillary: 137 mg/dL — ABNORMAL HIGH (ref 70–99)

## 2022-04-21 LAB — ETHANOL: Alcohol, Ethyl (B): <10 mg/dL (ref <10)

## 2022-04-21 LAB — AMMONIA: Ammonia: 22 umol/L (ref 9–35)

## 2022-04-21 LAB — TSH: TSH: 2.424 u[IU]/mL (ref 0.350–4.500)

## 2022-04-21 LAB — PROTIME-INR
INR: 1.1 (ref 0.8–1.2)
Prothrombin Time: 14.3 seconds (ref 11.4–15.2)

## 2022-04-21 MED ORDER — CLOPIDOGREL BISULFATE 75 MG PO TABS
75.0000 mg | ORAL_TABLET | Freq: Every day | ORAL | Status: DC
  Administered 2022-04-22: 75 mg via ORAL
  Filled 2022-04-21: qty 1

## 2022-04-21 MED ORDER — PANTOPRAZOLE SODIUM 40 MG PO TBEC
40.0000 mg | DELAYED_RELEASE_TABLET | Freq: Two times a day (BID) | ORAL | Status: DC
  Administered 2022-04-21 – 2022-04-25 (×8): 40 mg via ORAL
  Filled 2022-04-21 (×8): qty 1

## 2022-04-21 MED ORDER — DICYCLOMINE HCL 10 MG PO CAPS
10.0000 mg | ORAL_CAPSULE | Freq: Four times a day (QID) | ORAL | Status: DC | PRN
  Administered 2022-04-24: 10 mg via ORAL
  Filled 2022-04-21: qty 1

## 2022-04-21 MED ORDER — SIMETHICONE 80 MG PO CHEW
80.0000 mg | CHEWABLE_TABLET | Freq: Four times a day (QID) | ORAL | Status: DC | PRN
  Administered 2022-04-23: 80 mg via ORAL
  Filled 2022-04-21: qty 1

## 2022-04-21 MED ORDER — FLUTICASONE PROPIONATE 50 MCG/ACT NA SUSP
1.0000 | Freq: Every day | NASAL | Status: DC | PRN
  Administered 2022-04-23: 1 via NASAL
  Filled 2022-04-21 (×2): qty 16

## 2022-04-21 MED ORDER — OXYCODONE HCL 5 MG PO TABS
5.0000 mg | ORAL_TABLET | Freq: Four times a day (QID) | ORAL | Status: DC | PRN
  Administered 2022-04-21 – 2022-04-24 (×3): 5 mg via ORAL
  Filled 2022-04-21 (×3): qty 1

## 2022-04-21 MED ORDER — SUCRALFATE 1 GM/10ML PO SUSP
1.0000 g | Freq: Three times a day (TID) | ORAL | Status: DC
  Administered 2022-04-21 – 2022-04-25 (×13): 1 g via ORAL
  Filled 2022-04-21 (×17): qty 10

## 2022-04-21 MED ORDER — ACETAMINOPHEN 160 MG/5ML PO SOLN: 650.0000 mg | ORAL | Status: DC | PRN

## 2022-04-21 MED ORDER — STROKE: EARLY STAGES OF RECOVERY BOOK
Freq: Once | Status: AC
  Filled 2022-04-21: qty 1

## 2022-04-21 MED ORDER — ACETAMINOPHEN 650 MG RE SUPP: 650.0000 mg | RECTAL | Status: DC | PRN

## 2022-04-21 MED ORDER — ROSUVASTATIN CALCIUM 20 MG PO TABS
20.0000 mg | ORAL_TABLET | Freq: Every evening | ORAL | Status: DC
  Administered 2022-04-21: 20 mg via ORAL
  Filled 2022-04-21: qty 1

## 2022-04-21 MED ORDER — METHOCARBAMOL 500 MG PO TABS: 500.0000 mg | ORAL_TABLET | Freq: Four times a day (QID) | ORAL | Status: DC | PRN

## 2022-04-21 MED ORDER — FOLIC ACID 1 MG PO TABS
1.0000 mg | ORAL_TABLET | Freq: Every day | ORAL | Status: DC
  Administered 2022-04-22 – 2022-04-25 (×4): 1 mg via ORAL
  Filled 2022-04-21 (×4): qty 1

## 2022-04-21 MED ORDER — MOMETASONE FURO-FORMOTEROL FUM 200-5 MCG/ACT IN AERO
2.0000 | INHALATION_SPRAY | Freq: Two times a day (BID) | RESPIRATORY_TRACT | Status: DC
  Administered 2022-04-22 – 2022-04-25 (×7): 2 via RESPIRATORY_TRACT
  Filled 2022-04-21: qty 8.8

## 2022-04-21 MED ORDER — LIDOCAINE 5 % EX PTCH
1.0000 | MEDICATED_PATCH | Freq: Every day | CUTANEOUS | Status: DC | PRN
  Administered 2022-04-23: 1 via TRANSDERMAL
  Filled 2022-04-21 (×2): qty 1

## 2022-04-21 MED ORDER — POLYVINYL ALCOHOL 1.4 % OP SOLN: 1.0000 [drp] | Freq: Three times a day (TID) | OPHTHALMIC | Status: DC | PRN

## 2022-04-21 MED ORDER — CYCLOSPORINE 0.05 % OP EMUL
1.0000 [drp] | Freq: Two times a day (BID) | OPHTHALMIC | Status: DC
  Administered 2022-04-21 – 2022-04-25 (×8): 1 [drp] via OPHTHALMIC
  Filled 2022-04-21 (×9): qty 30

## 2022-04-21 MED ORDER — FERROUS SULFATE 325 (65 FE) MG PO TABS
325.0000 mg | ORAL_TABLET | Freq: Every day | ORAL | Status: DC
  Administered 2022-04-22 – 2022-04-25 (×3): 325 mg via ORAL
  Filled 2022-04-21 (×4): qty 1

## 2022-04-21 MED ORDER — OXYMETAZOLINE HCL 0.05 % NA SOLN: 1.0000 | Freq: Two times a day (BID) | NASAL | Status: DC | PRN

## 2022-04-21 MED ORDER — ACETAMINOPHEN 325 MG PO TABS
650.0000 mg | ORAL_TABLET | ORAL | Status: DC | PRN
  Administered 2022-04-21 – 2022-04-24 (×3): 650 mg via ORAL
  Filled 2022-04-21 (×3): qty 2

## 2022-04-21 MED ORDER — ENOXAPARIN SODIUM 30 MG/0.3ML IJ SOSY
30.0000 mg | PREFILLED_SYRINGE | INTRAMUSCULAR | Status: DC
  Administered 2022-04-21: 30 mg via SUBCUTANEOUS
  Filled 2022-04-21: qty 0.3

## 2022-04-21 MED ORDER — ALLOPURINOL 100 MG PO TABS
100.0000 mg | ORAL_TABLET | Freq: Every day | ORAL | Status: DC
  Administered 2022-04-22 – 2022-04-25 (×4): 100 mg via ORAL
  Filled 2022-04-21 (×4): qty 1

## 2022-04-21 MED ORDER — SODIUM CHLORIDE 0.9 % IV SOLN: INTRAVENOUS | Status: DC

## 2022-04-21 MED ORDER — BARRIER CREAM NON-SPECIFIED
1.0000 | TOPICAL_CREAM | Freq: Two times a day (BID) | TOPICAL | Status: DC
  Administered 2022-04-21 – 2022-04-24 (×6): 1 via TOPICAL
  Filled 2022-04-21: qty 1

## 2022-04-21 MED ORDER — SENNOSIDES-DOCUSATE SODIUM 8.6-50 MG PO TABS: 1.0000 | ORAL_TABLET | Freq: Every evening | ORAL | Status: DC | PRN

## 2022-04-21 MED ORDER — HYDROXYZINE HCL 25 MG PO TABS
25.0000 mg | ORAL_TABLET | Freq: Two times a day (BID) | ORAL | Status: DC | PRN
  Administered 2022-04-21 – 2022-04-24 (×4): 25 mg via ORAL
  Filled 2022-04-21 (×4): qty 1

## 2022-04-21 NOTE — ED Triage Notes (Signed)
Pt arrives via EMS from [place]. Staff told EMS pt was "altered today" and had no other information to provide. Pt was admitted 1 month ago for pancreatitis. Pt's only complaint is that her "butt hurts" from the bed. Pt is alert to self, disoriented to time, place and situation. BP 129/70, 99% on 2L [place] that pt wears at baseline, CBG 148, HR 90 paced rhythm.

## 2022-04-21 NOTE — ED Notes (Addendum)
ED TO INPATIENT HANDOFF REPORT  ED Nurse Name and Phone #: Markeshia Giebel/ [phone]  S Name/Age/Gender [name] 77 y.o. female Room/Bed: 010C/010C  Code Status   Code Status: Full Code  Home/SNF/Other Skilled nursing facility Patient oriented to: self and place Is this baseline?  Unsure if this is her baseline or not  Triage Complete: Triage complete  Chief Complaint Expressive aphasia [R47.01]  Triage Note Pt arrives via EMS from [place]. Staff told EMS pt was "altered today" and had no other information to provide. Pt was admitted 1 month ago for pancreatitis. Pt's only complaint is that her "butt hurts" from the bed. Pt is alert to self, disoriented to time, place and situation. BP 129/70, 99% on 2L [place] that pt wears at baseline, CBG 148, HR 90 paced rhythm.   Allergies Allergies  Allergen Reactions   Ivp Dye [Iodinated Contrast Media] Shortness Of Breath    No reaction to PO contrast with non-ionic dye.06-25-2014/rsm   Shellfish Allergy Anaphylaxis   Sulfa Antibiotics Shortness Of Breath   Iodine Hives   Benadryl [Diphenhydramine] Other (See Comments)    "THIS DRIVES ME CRAZY AND MAKES ME FEEL LIKE I AM DYING"    Lipitor [Atorvastatin] Other (See Comments)    Myalgias    Mitigare [Colchicine] Nausea And Vomiting   Uloric [Febuxostat] Other (See Comments)    Unknown reaction Documented on MAR   Vibra-Tab [Doxycycline] Other (See Comments)    Unknown reaction Documented on MAR   Keflex [Cephalexin] Itching and Rash   Zithromax [Azithromycin] Rash    Level of Care/Admitting Diagnosis ED Disposition     ED Disposition  Admit   Condition  --   Comment  Hospital Area: [facility] [[id number]]  Level of Care: Telemetry Medical [104]  May place patient in observation at [facility] or [facility] if equivalent level of care is available:: No  Covid Evaluation: Asymptomatic - no recent exposure (last 10 days) testing not required  Diagnosis:  Expressive aphasia [id number]  Admitting Physician: Norval Morton [id number]  Attending Physician: Norval Morton [id number]          B Medical/Surgery History Past Medical History:  Diagnosis Date   AICD (automatic cardioverter/defibrillator) present    downgraded to CRT-P in 2014   Anemia    Arthritis    Asthma    Atrial fibrillation ([place]) 10/24/2015   Questionable history of A Fib/A Flutter. On device interrogation on 9/7, showed 0.74mn of A Fib/A Flutter with 1% burden.  Device check in Jan 2018 showed no sustained AF (runs of less than 30 seconds). No anticoagulation indicated.   CAD (coronary artery disease)    a. 10/09/16 LHC: SVG->LAD patent, SVG->Diag patent, SVG->RCA patent, SVG->LCx occluded. EF 60%, b. 10/31/16 LHC DES to AV groove Circ, DES to intermed branch   Chronic lower back pain    Fatty liver disease, nonalcoholic    GERD (gastroesophageal reflux disease)    Gout    H/O hiatal hernia    High cholesterol    Hyperplastic colon polyp    Hypertension    IBS (irritable bowel syndrome)    Internal hemorrhoids    INTERNAL HEMORRHOIDS WITHOUT MENTION COMP 04/12/2007   Qualifier: Diagnosis of  By: BOlevia PerchesMD, Dora M    [place]ischemic cardiomyopathy [place] 11/22/2011   Pt responded to BiV ICD- last EF 55-60% Sept 2017   Obesity    OSA (obstructive sleep apnea)    "can't tolerate a mask", wears 2L  nocturnal O2   Presence of permanent cardiac pacemaker    11/02/12 [employer] V273 INTUA PPM   Stroke [facility]) 09/2020   Type II diabetes mellitus [facility])    Past Surgical History:  Procedure Laterality Date   ABDOMINAL ULTRASOUND  12/01/2011   Peripelvic cysts- #1- 2.4x1.9x2.3cm, #2-1.2x0.9x1.2cm   ANKLE FRACTURE SURGERY Right    "had rod put in"   ANTERIOR CERVICAL DECOMP/DISCECTOMY FUSION     APPENDECTOMY     BACK SURGERY     BIOPSY  12/29/2017   Procedure: BIOPSY;  Surgeon: [name], MD;  [facility]: [facility];  Service: Endoscopy;;   BIOPSY   03/20/2022   Procedure: BIOPSY;  Surgeon: Lavena Bullion, DO;  Location: [facility];  Service: Gastroenterology;;   BIV ICD INSERTION CRT-D  2001?   BIV PACEMAKER GENERATOR CHANGE OUT N/A 11/02/2012   Procedure: BIV PACEMAKER GENERATOR CHANGE OUT;  Surgeon: Sanda Klein, MD;  Location: [place] CATH LAB;  Service: Cardiovascular;  Laterality: N/A;   CARDIAC CATHETERIZATION  05/17/1999   No significant coronary obstructive disease w/ mild 20% luminal irregularity of the first diag branch of the LAD   CARDIAC CATHETERIZATION  07/08/2002   No significant CAD, moderately depressed LV systolic function   CARDIAC CATHETERIZATION Bilateral 04/26/2007   Normal findings, recommend medical therapy   CARDIAC CATHETERIZATION  02/18/2008   Moderate CAD, would benefit from having a functional study, recommend continue medical therapy   CARDIAC CATHETERIZATION  07/23/2012   Medical therapy   CARDIAC CATHETERIZATION N/A 11/24/2014   Procedure: Left Heart Cath and Coronary Angiography;  Surgeon: Troy Sine, MD;  Location: [place] CV LAB;  Service: Cardiovascular;  Laterality: N/A;   CARDIAC CATHETERIZATION  11/27/2014   Procedure: Intravascular Pressure Wire/FFR Study;  Surgeon: Peter M Martinique, MD;  Location: [place] CV LAB;  Service: Cardiovascular;;   CARDIAC CATHETERIZATION  10/09/2016   CHOLECYSTECTOMY N/A 04/09/2018   Procedure: LAPAROSCOPIC CHOLECYSTECTOMY;  Surgeon: Greer Pickerel, MD;  [facility];  Service: General;  Laterality: N/A;   COLONOSCOPY WITH PROPOFOL N/A 12/29/2017   Procedure: COLONOSCOPY WITH PROPOFOL;  Surgeon: [name], MD;  [facility];  Service: Endoscopy;  Laterality: N/A;   CONVERSION TO TOTAL HIP Left 02/12/2022   Procedure: NAIL REMOVAL WITH CONVERSION TO TOTAL HIP;  Surgeon: Willaim Sheng, MD;  Location: [place];  Service: Orthopedics;  Laterality: Left;   CORONARY ANGIOGRAM  2010   CORONARY ARTERY BYPASS GRAFT N/A 11/29/2014   Procedure:  CORONARY ARTERY BYPASS GRAFTING x 5 (LIMA-LAD, SVG-D, SVG-OM1-OM2, SVG-PD);  Surgeon: Melrose Nakayama, MD;  Location: [place];  Service: Open Heart Surgery;  Laterality: N/A;   CORONARY STENT INTERVENTION N/A 10/31/2016   Procedure: CORONARY STENT INTERVENTION;  Surgeon: Burnell Blanks, MD;  Location: [place] CV LAB;  Service: Cardiovascular;  Laterality: N/A;   ESOPHAGOGASTRODUODENOSCOPY (EGD) WITH PROPOFOL N/A 12/29/2017   Procedure: ESOPHAGOGASTRODUODENOSCOPY (EGD) WITH PROPOFOL;  Surgeon: [name], MD;  [facility];  Service: Endoscopy;  Laterality: N/A;   ESOPHAGOGASTRODUODENOSCOPY (EGD) WITH PROPOFOL N/A 03/20/2022   Procedure: ESOPHAGOGASTRODUODENOSCOPY (EGD) WITH PROPOFOL;  Surgeon: Lavena Bullion, DO;  Location: [place];  Service: Gastroenterology;  Laterality: N/A;   FRACTURE SURGERY     INSERT / REPLACE / REMOVE PACEMAKER  1999   INTRAMEDULLARY (IM) NAIL INTERTROCHANTERIC Left 04/04/2021   Procedure: INTRAMEDULLARY (IM) NAIL INTERTROCHANTRIC;  Surgeon: Vanetta Mulders, MD;  Location: [place];  Service: Orthopedics;  Laterality: Left;   IR  FLUORO GUIDE CV LINE LEFT  04/11/2021   IR US GUIDE VASC ACCESS LEFT  04/11/2021   KNEE ARTHROSCOPY Bilateral    LEFT HEART CATH AND CORS/GRAFTS ANGIOGRAPHY N/A 12/09/2017   Procedure: LEFT HEART CATH AND CORS/GRAFTS ANGIOGRAPHY;  Surgeon: Troy Sine, MD;  Location: [place] CV LAB;  Service: Cardiovascular;  Laterality: N/A;   LEFT HEART CATHETERIZATION WITH CORONARY ANGIOGRAM N/A 07/23/2012   Procedure: LEFT HEART CATHETERIZATION WITH CORONARY ANGIOGRAM;  Surgeon: Leonie Man, MD;  Location: [facility] CATH LAB;  Service: Cardiovascular;  Laterality: N/A;   LEXISCAN MYOVIEW  11/14/2011   Mild-moderate defect seen in Mid Inferolateral and Mid Anterolateral regions-consistant w/ infarct/scar. No significant ischemia demonstrated.   LUMBAR PERCUTANEOUS PEDICLE SCREW 4 LEVEL N/A 09/10/2021   Procedure: THORACIC  SEVEN THROUGH THORACIC ELEVEN  PERCUTANEOUS PEDICLE SCREW PLACEMENT;  Surgeon: Judith Part, MD;  Location: [place];  Service: Neurosurgery;  Laterality: N/A;   POLYPECTOMY  12/29/2017   Procedure: POLYPECTOMY;  Surgeon: [name], MD;  [facility];  Service: Endoscopy;;   PPM GENERATOR CHANGEOUT N/A 12/31/2020   Procedure: PPM GENERATOR CHANGEOUT;  Surgeon: Sanda Klein, MD;  Location: [place] CV LAB;  Service: Cardiovascular;  Laterality: N/A;   RIGHT/LEFT HEART CATH AND CORONARY ANGIOGRAPHY N/A 10/09/2016   Procedure: RIGHT/LEFT HEART CATH AND CORONARY ANGIOGRAPHY;  Surgeon: Jolaine Artist, MD;  Location: [place] CV LAB;  Service: Cardiovascular;  Laterality: N/A;   RIGHT/LEFT HEART CATH AND CORONARY/GRAFT ANGIOGRAPHY N/A 03/11/2019   Procedure: RIGHT/LEFT HEART CATH AND CORONARY/GRAFT ANGIOGRAPHY;  Surgeon: Jolaine Artist, MD;  Location: [place] CV LAB;  Service: Cardiovascular;  Laterality: N/A;   TEE WITHOUT CARDIOVERSION N/A 11/29/2014   Procedure: TRANSESOPHAGEAL ECHOCARDIOGRAM (TEE);  Surgeon: Melrose Nakayama, MD;  Location: [place];  Service: Open Heart Surgery;  Laterality: N/A;   TRANSCAROTID ARTERY REVASCULARIZATION  Right 10/01/2020   Procedure: RIGHT TRANSCAROTID ARTERY REVASCULARIZATION;  Surgeon: [name], MD;  Location: [facility];  Service: Vascular;  Laterality: Right;   TRANSTHORACIC ECHOCARDIOGRAM  07/23/2012   EF 55-60%, normal-mild   TUBAL LIGATION     ULTRASOUND GUIDANCE FOR VASCULAR ACCESS Left 10/01/2020   Procedure: ULTRASOUND GUIDANCE FOR VASCULAR ACCESS;  Surgeon: [name], MD;  Location: [place];  Service: Vascular;  Laterality: Left;     A IV Location/Drains/Wounds Patient Lines/Drains/Airways Status     Active Line/Drains/Airways     Name Placement date Placement time Site Days   Peripheral IV 04/21/22 20 G Anterior;Proximal;Right Forearm 04/21/22  1213  Forearm  less than 1   Pressure  Injury 03/17/22 Sacrum Mid Stage 2 -  Partial thickness loss of dermis presenting as a shallow open injury with a red, pink wound bed without slough. small open section, can see previous healed wound 03/17/22  2315  -- 35            Intake/Output Last 24 hours No intake or output data in the 24 hours ending 04/21/22 1631  Labs/Imaging Results for orders placed or performed during the hospital encounter of 04/21/22 (from the past 48 hour(s))  CBG monitoring, ED     Status: Abnormal   Collection Time: 04/21/22 12:05 PM  Result Value Ref Range   Glucose-Capillary 137 (H) 70 - 99 mg/dL    Comment: Glucose reference range applies only to samples taken after fasting for at least 8 hours.  Lactic acid, plasma     Status: None   Collection Time: 04/21/22 12:13  PM  Result Value Ref Range   Lactic Acid, Venous 1.4 0.5 - 1.9 mmol/L    Comment: Performed at [facility] [address], [place], [place] [address]  Ethanol     Status: None   Collection Time: 04/21/22 12:13 PM  Result Value Ref Range   Alcohol, Ethyl (B) <10 <10 mg/dL    Comment: (NOTE) Lowest detectable limit for serum alcohol is 10 mg/dL.  For medical purposes only. Performed at [facility], [place] [address], [place], [place] [address]   Ammonia     Status: None   Collection Time: 04/21/22 12:13 PM  Result Value Ref Range   Ammonia 22 9 - 35 umol/L    Comment: Performed at [facility], [place] [address] [place], [place] [address]  CBC with Differential     Status: Abnormal   Collection Time: 04/21/22 12:14 PM  Result Value Ref Range   WBC 6.9 4.0 - 10.5 K/uL   RBC 4.04 3.87 - 5.11 MIL/uL   Hemoglobin 11.3 (L) 12.0 - 15.0 g/dL   HCT 37.5 36.0 - 46.0 %   MCV 92.8 80.0 - 100.0 fL   MCH 28.0 26.0 - 34.0 pg   MCHC 30.1 30.0 - 36.0 g/dL   RDW 17.2 (H) 11.5 - 15.5 %   Platelets 124 (L) 150 - 400 K/uL   nRBC 0.0 0.0 - 0.2 %   Neutrophils Relative % 75 %   Neutro Abs 5.2 1.7 - 7.7 K/uL   Lymphocytes  Relative 15 %   Lymphs Abs 1.1 0.7 - 4.0 K/uL   Monocytes Relative 6 %   Monocytes Absolute 0.4 0.1 - 1.0 K/uL   Eosinophils Relative 3 %   Eosinophils Absolute 0.2 0.0 - 0.5 K/uL   Basophils Relative 1 %   Basophils Absolute 0.0 0.0 - 0.1 K/uL   Immature Granulocytes 0 %   Abs Immature Granulocytes 0.02 0.00 - 0.07 K/uL    Comment: Performed at [facility] [address], [place], [place] [address]  Protime-INR     Status: None   Collection Time: 04/21/22 12:14 PM  Result Value Ref Range   Prothrombin Time 14.3 11.4 - 15.2 seconds   INR 1.1 0.8 - 1.2    Comment: (NOTE) INR goal varies based on device and disease states. Performed at [facility], [place] [address], [place], [place] [address]   Troponin I (High Sensitivity)     Status: Abnormal   Collection Time: 04/21/22 12:14 PM  Result Value Ref Range   Troponin I (High Sensitivity) 158 (HH) <18 ng/L    Comment: CRITICAL RESULT CALLED TO, READ BACK BY AND VERIFIED WITH HOPE DOOLEY, RN @ 1326 04/21/22 BY SEKDAHL (NOTE) Elevated high sensitivity troponin I (hsTnI) values and significant  changes across serial measurements may suggest ACS but many other  chronic and acute conditions are known to elevate hsTnI results.  Refer to the "Links" section for chest pain algorithms and additional  guidance. Performed at [facility], [place] [address] [place], [place] [address]   Comprehensive metabolic panel     Status: Abnormal   Collection Time: 04/21/22 12:14 PM  Result Value Ref Range   Sodium 135 135 - 145 mmol/L   Potassium 5.0 3.5 - 5.1 mmol/L   Chloride 110 98 - 111 mmol/L   CO2 18 (L) 22 - 32 mmol/L   Glucose, Bld 154 (H) 70 - 99 mg/dL  Comment: Glucose reference range applies only to samples taken after fasting for at least 8 hours.   BUN 28 (H) 8 - 23 mg/dL   Creatinine, Ser 2.05 (H) 0.44 - 1.00 mg/dL   Calcium 10.1 8.9 - 10.3 mg/dL   Total Protein 5.7 (L) 6.5 - 8.1 g/dL   Albumin 2.4 (L) 3.5 - 5.0  g/dL   AST 56 (H) 15 - 41 U/L   ALT 40 0 - 44 U/L   Alkaline Phosphatase 356 (H) 38 - 126 U/L   Total Bilirubin 0.5 0.3 - 1.2 mg/dL   GFR, Estimated 24 (L) >60 mL/min    Comment: (NOTE) Calculated using the CKD-EPI Creatinine Equation (2021)    Anion gap 7 5 - 15    Comment: Performed at [facility], [place] [address], [place], [place] [address]  Lipase, blood     Status: Abnormal   Collection Time: 04/21/22 12:14 PM  Result Value Ref Range   Lipase 198 (H) 11 - 51 U/L    Comment: Performed at [facility] [address] [place], [place] [address]  Troponin I (High Sensitivity)     Status: Abnormal   Collection Time: 04/21/22  2:14 PM  Result Value Ref Range   Troponin I (High Sensitivity) 164 (HH) <18 ng/L    Comment: CRITICAL VALUE NOTED. VALUE IS CONSISTENT WITH PREVIOUSLY REPORTED/CALLED VALUE (NOTE) Elevated high sensitivity troponin I (hsTnI) values and significant  changes across serial measurements may suggest ACS but many other  chronic and acute conditions are known to elevate hsTnI results.  Refer to the "Links" section for chest pain algorithms and additional  guidance. Performed at [facility], [place] [address], [place],  [address]    *Note: Due to a large number of results and/or encounters for the requested time period, some results have not been displayed. A complete set of results can be found in Results Review.   DG Chest Port 1 View  Result Date: 04/21/2022 CLINICAL DATA:  Weakness EXAM: PORTABLE CHEST 1 VIEW COMPARISON:  CXR 03/04/22 FINDINGS: Status post median sternotomy and CABG. Right-sided cardiac device in place with unchanged lead positioning. Thoracic spinal fusion hardware in place. Low lung volumes. Unchanged left-sided pleural effusion. No right-sided pleural effusion. No new focal airspace opacity. No pneumothorax. Unchanged cardiac and mediastinal contours. Visualized upper abdomen is unremarkable. IMPRESSION: No acute  abnormality. Electronically Signed   By: Marin Roberts M.D.   On: 04/21/2022 13:45   CT Head Wo Contrast  Result Date: 04/21/2022 CLINICAL DATA:  Altered mental status EXAM: CT HEAD WITHOUT CONTRAST TECHNIQUE: Contiguous axial images were obtained from the base of the skull through the vertex without intravenous contrast. RADIATION DOSE REDUCTION: This exam was performed according to the departmental dose-optimization program which includes automated exposure control, adjustment of the mA and/or kV according to patient size and/or use of iterative reconstruction technique. COMPARISON:  CT Head 01/26/22 FINDINGS: Brain: No evidence of acute infarction, hemorrhage, hydrocephalus, extra-axial collection or mass lesion/mass effect. Sequela of severe chronic microvascular ischemic change. Vascular: No hyperdense vessel or unexpected calcification. Skull: Normal. Negative for fracture or focal lesion. Sinuses/Orbits: No middle ear or mastoid effusion. Paranasal sinuses are clear. Bilateral lens replacement. Orbits are otherwise unremarkable. Other: None. IMPRESSION: No acute intracranial abnormality. Electronically Signed   By: Marin Roberts M.D.   On: 04/21/2022 13:13    Pending Labs [employer] (From admission, onward)     Start  Ordered   04/22/22 0500  CBC  Tomorrow morning,   R        04/21/22 1630   04/22/22 0500  Comprehensive metabolic panel  Tomorrow morning,   R        04/21/22 1630   04/22/22 0500  Lipid panel  Tomorrow morning,   R        04/21/22 1630   04/22/22 0500  Hemoglobin A1c  Tomorrow morning,   R        04/21/22 1630   04/21/22 1631  TSH  Add-on,   AD        04/21/22 1630   04/21/22 1214  Resp panel by RT-PCR (RSV, Flu A&B, Covid) Anterior Nasal Swab  (Resp panel by RT-PCR (RSV, Flu A&B, Covid))  Once,   URGENT        04/21/22 1214   04/21/22 1214  Urinalysis, Routine w reflex microscopic -Urine, Clean Catch  Once,   URGENT       Question:  Specimen Source  Answer:   Urine, Clean Catch   04/21/22 1214   04/21/22 1214  Urine rapid drug screen (hosp performed)  Once,   STAT        04/21/22 1214            Vitals/Pain Today's Vitals   04/21/22 1201 04/21/22 1430  BP: (!) 147/77 (!) 148/78  Pulse:  80  Resp: (!) 22 20  Temp: (!) 97.5 F (36.4 C)   TempSrc: Oral   SpO2: 100% 100%    Isolation Precautions No active isolations  Medications Medications   stroke: early stages of recovery book (has no administration in time range)  0.9 %  sodium chloride infusion (has no administration in time range)  acetaminophen (TYLENOL) tablet 650 mg (has no administration in time range)    Or  acetaminophen (TYLENOL) 160 MG/5ML solution 650 mg (has no administration in time range)    Or  acetaminophen (TYLENOL) suppository 650 mg (has no administration in time range)  senna-docusate (Senokot-S) tablet 1 tablet (has no administration in time range)  enoxaparin (LOVENOX) injection 40 mg (has no administration in time range)    Mobility manual wheelchair     Focused Assessments     R Recommendations: See Admitting Provider Note  Report given to:   Additional Notes:  Pt [facility] from [facility] and [facility]. They called out for AMS but not sure of her baseline. CT scan was negative. She's still waiting to have an MRI done.  Her 1st trop was 158 and her 2nd trop was 164. She's on a purewick and they would still like urine on her when she pees. She has a 20 G in R FA.

## 2022-04-21 NOTE — H&P (Addendum)
History and Physical    Patient: [name] [id number] DOB: 20-Mar-1945 DOA: 04/21/2022 DOS: the patient was seen and examined on 04/21/2022 PCP: Lujean Amel, MD  Patient coming from: heart   Chief Complaint:  Chief Complaint  Patient presents with   Altered Mental Status   HPI: [name] is a 77 y.o. female with medical history significant of hypertension, dyslipidemia, chronic systolic and diastolic CHF, atrial fibrillation, CAD s/p PCI, CHB s/pPM, asthma, chronic respiratory failure with hypoxia oxygen dependent on 2 - 3 L, CVA, DM type II, GERD, and OSA who presented with confusion difficulty speaking that seem to start yesterday morning.  She reports waking up around 9 AM.  She was in the dining hall from around 10:30- 12:30PM she first noticed she was having difficulty speaking.  She reported knowing what she wanted to say, but stated that the words did not come out right.  Noted associated symptoms of feeling confused and chronic leg pain.  2 days ago she says that she was given Linzess although she reports telling facility that she cannot tolerate this medication and it caused her to have significant diarrhea and be sick on her stomach.    Patient had just recently been hospitalized from 2/5-2/23 with complaints of generalized abdominal pain and was found to have concern for acute pain   In the emergency department patient was noted to be afebrile with stable vital signs.  Labs 24 BUN 28, creatinine 2.05, alkaline phosphatase 356, lipase 198, AST 56, and high-sensitivity troponin 158-> 164.  CT imaging of the head did not note any acute abnormality.  Chest x-ray did not note any acute abnormality.  Neurology had been formally consulted.  Review of Systems: As mentioned in the history of present illness. All other systems reviewed and are negative. Past Medical History:  Diagnosis Date   AICD (automatic cardioverter/defibrillator) present    downgraded to CRT-P in  2014   Anemia    Arthritis    Asthma    Atrial fibrillation ([place]) 10/24/2015   Questionable history of A Fib/A Flutter. On device interrogation on 9/7, showed 0.76mn of A Fib/A Flutter with 1% burden.  Device check in Jan 2018 showed no sustained AF (runs of less than 30 seconds). No anticoagulation indicated.   CAD (coronary artery disease)    a. 10/09/16 LHC: SVG->LAD patent, SVG->Diag patent, SVG->RCA patent, SVG->LCx occluded. EF 60%, b. 10/31/16 LHC DES to AV groove Circ, DES to intermed branch   Chronic lower back pain    Fatty liver disease, nonalcoholic    GERD (gastroesophageal reflux disease)    Gout    H/O hiatal hernia    High cholesterol    Hyperplastic colon polyp    Hypertension    IBS (irritable bowel syndrome)    Internal hemorrhoids    INTERNAL HEMORRHOIDS WITHOUT MENTION COMP 04/12/2007   Qualifier: Diagnosis of  By: BOlevia PerchesMD, Dora M    Nonischemic cardiomyopathy ([place] 11/22/2011   Pt responded to BiV ICD- last EF 55-60% Sept 2017   Obesity    OSA (obstructive sleep apnea)    "can't tolerate a mask", wears 2L nocturnal O2   Presence of permanent cardiac pacemaker    11/02/12 [employer] V273 INTUA PPM   Stroke ([place] 09/2020   Type II diabetes mellitus ([name]    Past Surgical History:  Procedure Laterality Date   ABDOMINAL ULTRASOUND  12/01/2011   Peripelvic cysts- #1- 2.4x1.9x2.3cm, #2-1.2x0.9x1.2cm   ANKLE FRACTURE SURGERY Right    "  had rod put in"   ANTERIOR CERVICAL DECOMP/DISCECTOMY FUSION     APPENDECTOMY     BACK SURGERY     BIOPSY  12/29/2017   Procedure: BIOPSY;  Surgeon: [name], MD;  [facility]: [facility];  Service: Endoscopy;;   BIOPSY  03/20/2022   Procedure: BIOPSY;  Surgeon: Lavena Bullion, DO;  Location: [place];  Service: Gastroenterology;;   BIV ICD INSERTION CRT-D  2001?   BIV PACEMAKER GENERATOR CHANGE OUT N/A 11/02/2012   Procedure: BIV PACEMAKER GENERATOR CHANGE OUT;  Surgeon: Sanda Klein, MD;  Location: [facility];  Service: Cardiovascular;  Laterality: N/A;   CARDIAC CATHETERIZATION  05/17/1999   No significant coronary obstructive disease w/ mild 20% luminal irregularity of the first diag branch of the LAD   CARDIAC CATHETERIZATION  07/08/2002   No significant CAD, moderately depressed LV systolic function   CARDIAC CATHETERIZATION Bilateral 04/26/2007   Normal findings, recommend medical therapy   CARDIAC CATHETERIZATION  02/18/2008   Moderate CAD, would benefit from having a functional study, recommend continue medical therapy   CARDIAC CATHETERIZATION  07/23/2012   Medical therapy   CARDIAC CATHETERIZATION N/A 11/24/2014   Procedure: Left Heart Cath and Coronary Angiography;  Surgeon: Troy Sine, MD;  Location: [place] [facility];  Service: Cardiovascular;  Laterality: N/A;   CARDIAC CATHETERIZATION  11/27/2014   Procedure: Intravascular Pressure Wire/FFR Study;  Surgeon: Peter M Martinique, MD;  Location: [facility];  Service: Cardiovascular;;   CARDIAC CATHETERIZATION  10/09/2016   CHOLECYSTECTOMY N/A 04/09/2018   Procedure: LAPAROSCOPIC CHOLECYSTECTOMY;  Surgeon: Greer Pickerel, MD;  [facility];  Service: General;  Laterality: N/A;   COLONOSCOPY WITH PROPOFOL N/A 12/29/2017   Procedure: COLONOSCOPY WITH PROPOFOL;  Surgeon: [name], MD;  [facility];  Service: Endoscopy;  Laterality: N/A;   CONVERSION TO TOTAL HIP Left 02/12/2022   Procedure: NAIL REMOVAL WITH CONVERSION TO TOTAL HIP;  Surgeon: Willaim Sheng, MD;  Location: [facility];  Service: Orthopedics;  Laterality: Left;   CORONARY ANGIOGRAM  2010   CORONARY ARTERY BYPASS GRAFT N/A 11/29/2014   Procedure: CORONARY ARTERY BYPASS GRAFTING x 5 (LIMA-LAD, SVG-D, SVG-OM1-OM2, SVG-PD);  Surgeon: Melrose Nakayama, MD;  Location: [facility];  Service: Open Heart Surgery;  Laterality: N/A;   CORONARY STENT INTERVENTION N/A 10/31/2016   Procedure: CORONARY STENT INTERVENTION;  Surgeon: Burnell Blanks, MD;   Location: [place] [facility];  Service: Cardiovascular;  Laterality: N/A;   ESOPHAGOGASTRODUODENOSCOPY (EGD) WITH PROPOFOL N/A 12/29/2017   Procedure: ESOPHAGOGASTRODUODENOSCOPY (EGD) WITH PROPOFOL;  Surgeon: [name], MD;  [facility];  Service: Endoscopy;  Laterality: N/A;   ESOPHAGOGASTRODUODENOSCOPY (EGD) WITH PROPOFOL N/A 03/20/2022   Procedure: ESOPHAGOGASTRODUODENOSCOPY (EGD) WITH PROPOFOL;  Surgeon: Lavena Bullion, DO;  Location: [facility];  Service: Gastroenterology;  Laterality: N/A;   FRACTURE SURGERY     INSERT / REPLACE / REMOVE PACEMAKER  1999   INTRAMEDULLARY (IM) NAIL INTERTROCHANTERIC Left 04/04/2021   Procedure: INTRAMEDULLARY (IM) NAIL INTERTROCHANTRIC;  Surgeon: Vanetta Mulders, MD;  Location: [facility];  Service: Orthopedics;  Laterality: Left;   IR FLUORO GUIDE CV LINE LEFT  04/11/2021   IR US GUIDE VASC ACCESS LEFT  04/11/2021   KNEE ARTHROSCOPY Bilateral    LEFT HEART CATH AND CORS/GRAFTS ANGIOGRAPHY N/A 12/09/2017   Procedure: LEFT HEART CATH AND CORS/GRAFTS ANGIOGRAPHY;  Surgeon: Troy Sine, MD;  Location: [place] [facility];  Service: Cardiovascular;  Laterality: N/A;  LEFT HEART CATHETERIZATION WITH CORONARY ANGIOGRAM N/A 07/23/2012   Procedure: LEFT HEART CATHETERIZATION WITH CORONARY ANGIOGRAM;  Surgeon: Leonie Man, MD;  Location: [facility];  Service: Cardiovascular;  Laterality: N/A;   LEXISCAN MYOVIEW  11/14/2011   Mild-moderate defect seen in Mid Inferolateral and Mid Anterolateral regions-consistant w/ infarct/scar. No significant ischemia demonstrated.   LUMBAR PERCUTANEOUS PEDICLE SCREW 4 LEVEL N/A 09/10/2021   Procedure: THORACIC SEVEN THROUGH THORACIC ELEVEN  PERCUTANEOUS PEDICLE SCREW PLACEMENT;  Surgeon: Judith Part, MD;  Location: [place];  Service: Neurosurgery;  Laterality: N/A;   POLYPECTOMY  12/29/2017   Procedure: POLYPECTOMY;  Surgeon: [name], MD;  [facility];  Service: Endoscopy;;   PPM  GENERATOR CHANGEOUT N/A 12/31/2020   Procedure: PPM GENERATOR CHANGEOUT;  Surgeon: Sanda Klein, MD;  Location: [place] [facility];  Service: Cardiovascular;  Laterality: N/A;   RIGHT/LEFT HEART CATH AND CORONARY ANGIOGRAPHY N/A 10/09/2016   Procedure: RIGHT/LEFT HEART CATH AND CORONARY ANGIOGRAPHY;  Surgeon: Jolaine Artist, MD;  Location: [place] CV LAB;  Service: Cardiovascular;  Laterality: N/A;   RIGHT/LEFT HEART CATH AND CORONARY/GRAFT ANGIOGRAPHY N/A 03/11/2019   Procedure: RIGHT/LEFT HEART CATH AND CORONARY/GRAFT ANGIOGRAPHY;  Surgeon: Jolaine Artist, MD;  Location: [place] [facility];  Service: Cardiovascular;  Laterality: N/A;   TEE WITHOUT CARDIOVERSION N/A 11/29/2014   Procedure: TRANSESOPHAGEAL ECHOCARDIOGRAM (TEE);  Surgeon: Melrose Nakayama, MD;  Location: [place];  Service: Open Heart Surgery;  Laterality: N/A;   TRANSCAROTID ARTERY REVASCULARIZATION  Right 10/01/2020   Procedure: RIGHT TRANSCAROTID ARTERY REVASCULARIZATION;  Surgeon: [name], MD;  Location: [facility];  Service: Vascular;  Laterality: Right;   TRANSTHORACIC ECHOCARDIOGRAM  07/23/2012   EF 55-60%, normal-mild   TUBAL LIGATION     ULTRASOUND GUIDANCE FOR VASCULAR ACCESS Left 10/01/2020   Procedure: ULTRASOUND GUIDANCE FOR VASCULAR ACCESS;  Surgeon: [name], MD;  Location: [facility];  Service: Vascular;  Laterality: Left;   Social History:  reports that she quit smoking about 54 years ago. Her smoking use included cigarettes. She has a 0.75 pack-year smoking history. She has never used smokeless tobacco. She reports that she does not drink alcohol and does not use drugs.  Allergies  Allergen Reactions   Ivp Dye [Iodinated Contrast Media] Shortness Of Breath    No reaction to PO contrast with non-ionic dye.06-25-2014/rsm   Shellfish Allergy Anaphylaxis   Sulfa Antibiotics Shortness Of Breath   Iodine Hives   Benadryl [Diphenhydramine] Other (See Comments)    "THIS DRIVES ME CRAZY  AND MAKES ME FEEL LIKE I AM DYING"    Lipitor [Atorvastatin] Other (See Comments)    Myalgias    Mitigare [Colchicine] Nausea And Vomiting   Uloric [Febuxostat] Other (See Comments)    Unknown reaction Documented on MAR   Vibra-Tab [Doxycycline] Other (See Comments)    Unknown reaction Documented on MAR   Keflex [Cephalexin] Itching and Rash   Zithromax [Azithromycin] Rash    Family History  Problem Relation Age of Onset   Breast cancer Mother    Diabetes Mother    Asthma Sister    Heart disease Maternal Grandmother    Kidney disease Maternal Grandmother    Diabetes Maternal Grandmother    Glaucoma Maternal Aunt    Heart disease Maternal Aunt    Colon cancer Neg Hx    Stomach cancer Neg Hx    Pancreatic cancer Neg Hx     Prior to Admission medications   Medication Sig  Start Date End Date Taking? Authorizing Provider  acetaminophen (TYLENOL) 325 MG tablet Take 2 tablets (650 mg total) by mouth every 6 (six) hours as needed for mild pain (or Fever >/= 101). 03/24/22   Elgergawy, Silver Huguenin, MD  albuterol (PROVENTIL HFA;VENTOLIN HFA) 108 (90 Base) MCG/ACT inhaler Inhale 2 puffs into the lungs every 6 (six) hours as needed for wheezing or shortness of breath. 05/02/18   Norval Morton, MD  albuterol (PROVENTIL) (2.5 MG/3ML) 0.083% nebulizer solution Take 3 mLs (2.5 mg total) by nebulization every 6 (six) hours as needed for wheezing or shortness of breath. 05/02/18   Norval Morton, MD  allopurinol (ZYLOPRIM) 100 MG tablet Take 1 tablet (100 mg total) by mouth daily. Please contact the office to schedule appointment for additional refills. 1st Attempt. Patient taking differently: Take 100 mg by mouth daily. 12/05/21   Croitoru, [name], MD  barrier cream (NON-SPECIFIED) CREA Apply 1 Application topically See admin instructions. Apply topically to buttocks every shift and as needed for each incontinence episode.    [provider]  budesonide-formoterol (SYMBICORT) 160-4.5  MCG/ACT inhaler Inhale 1 puff into the lungs 2 (two) times daily.    [provider]  Cal Carb-Mag Hydrox-Simeth (MYLANTA COAT & COOL) 1200-270-80 MG/10ML SUSP Take 30 mLs by mouth 3 (three) times daily as needed (gas pains).    [provider]  carboxymethylcellulose (REFRESH PLUS) 0.5 % SOLN Place 1 drop into both eyes 3 (three) times daily as needed (dry eyes).    [provider]  clopidogrel (PLAVIX) 75 MG tablet Take 1 tablet (75 mg total) by mouth daily. 10/23/21   Croitoru, [name], MD  cycloSPORINE (RESTASIS) 0.05 % ophthalmic emulsion Place 1 drop into both eyes 2 (two) times daily.    [provider]  dicyclomine (BENTYL) 10 MG capsule Take 1 capsule (10 mg total) by mouth every 6 (six) hours as needed for spasms. 04/08/22   Cirigliano, Vito V, DO  ferrous sulfate 325 (65 FE) MG tablet Take 1 tablet (325 mg total) by mouth daily with breakfast. 02/19/22 03/21/22  Antonieta Pert, MD  fluticasone (FLONASE) 50 MCG/ACT nasal spray Place 1 spray into both nostrils daily as needed for allergies.    [provider]  folic acid (FOLVITE) 1 MG tablet Take 1 tablet (1 mg total) by mouth daily. 03/24/22 04/23/22  Elgergawy, Silver Huguenin, MD  furosemide (LASIX) 40 MG tablet Take 1 tablet (40 mg total) by mouth 2 (two) times daily. 5 day course. (02/26/22-03/02/22) Patient taking differently: Take 40 mg by mouth 2 (two) times daily. [id number]   Delora Fuel, MD  hydrOXYzine (ATARAX) 25 MG tablet Take 25 mg by mouth 2 (two) times daily as needed for itching.    [provider]  isosorbide mononitrate (IMDUR) 30 MG 24 hr tablet Take 2 tablets (60 mg total) by mouth daily. 03/24/22   Elgergawy, Silver Huguenin, MD  Lido-PE-Glycerin-Petrolatum (PREPARATION H RAPID RELIEF RE) Place 1 spray rectally every 6 (six) hours as needed (hemorrhoids).    [provider]  lidocaine 4 % Place 1 patch onto the skin daily as needed (pain).    [provider]  linaclotide  (LINZESS) 290 MCG CAPS capsule Take 290 mcg by mouth daily before breakfast.    [provider]  methocarbamol (ROBAXIN) 500 MG tablet Take 500 mg by mouth every 6 (six) hours as needed for muscle spasms.    [provider]  naloxegol oxalate (MOVANTIK) 12.5 MG TABS tablet Take  1 tablet (12.5 mg total) by mouth daily. 04/08/22   Cirigliano, Vito V, DO  nitroGLYCERIN (NITROSTAT) 0.4 MG SL tablet Place 1 tablet (0.4 mg total) under the tongue every 5 (five) minutes as needed for chest pain. Patient taking differently: Place 0.4 mg under the tongue every 5 (five) minutes x 3 doses as needed for chest pain. 11/13/16 03/18/22  Erlene Quan, PA-C  ondansetron (ZOFRAN) 4 MG tablet Take 4 mg by mouth daily as needed for nausea or vomiting.    [provider]  oxyCODONE (OXY IR/ROXICODONE) 5 MG immediate release tablet Take 1 tablet (5 mg total) by mouth every 6 (six) hours as needed for severe pain. 03/24/22   Elgergawy, Silver Huguenin, MD  OXYGEN Inhale 2-3 L/min into the lungs continuous.    [provider]  oxymetazoline (AFRIN) 0.05 % nasal spray Place 1 spray into both nostrils 2 (two) times daily as needed for congestion.    [provider]  pantoprazole (PROTONIX) 40 MG tablet Take 1 tablet (40 mg total) by mouth 2 (two) times daily. 07/08/19   [name], MD  polyethylene glycol powder (GLYCOLAX/MIRALAX) 17 GM/SCOOP powder Take 17 g by mouth daily as needed (constipation).    [provider]  potassium chloride SA (KLOR-CON M) 20 MEQ tablet Take 1 tablet (20 mEq total) by mouth 2 (two) times daily. [id number]   Delora Fuel, MD  rosuvastatin (CRESTOR) 20 MG tablet Take 1 tablet (20 mg total) by mouth every evening. NEED OV. Patient taking differently: Take 20 mg by mouth every evening. 10/23/21   Croitoru, [name], MD  simethicone (MYLICON) 80 MG chewable tablet Chew 80 mg by mouth 4 (four) times daily as needed for flatulence.    [provider]   sucralfate (CARAFATE) 1 GM/10ML suspension Take 10 mLs (1 g total) by mouth 4 (four) times daily -  with meals and at bedtime. 03/24/22   Elgergawy, Silver Huguenin, MD  vitamin B-12 (CYANOCOBALAMIN) 1000 MCG tablet Take 1 tablet (1,000 mcg total) by mouth daily. 08/15/21   Caren Griffins, MD    Physical Exam: Vitals:   04/21/22 1201 04/21/22 1430  BP: (!) 147/77 (!) 148/78  Pulse:  80  Resp: (!) 22 20  Temp: (!) 97.5 F (36.4 C)   TempSrc: Oral   SpO2: 100% 100%    Constitutional: Elderly female currently in no acute distress Eyes: PERRL, lids and conjunctivae normal ENMT: Mucous membranes are moist.   Neck: normal, supple  Respiratory: clear to auscultation bilaterally, no wheezing, no crackles.  O2 saturation currently maintained on 3 L nasal cannula oxygen Cardiovascular: Regular rate and rhythm, no murmurs / rubs / gallops. 2+ pedal pulses. No carotid bruits.  Abdomen: no tenderness, no masses palpated. No hepatosplenomegaly. Bowel sounds positive.  Musculoskeletal: no clubbing / cyanosis. No joint deformity upper and lower extremities. Good ROM, no contractures. Normal muscle tone.  Skin: no rashes, lesions, ulcers. No induration Neurologic: CN 2-12 grossly intact.  Expressive aphasia present. Psychiatric: Normal judgment and insight. Alert and oriented x 3. Normal mood.   Data Reviewed:  EKG revealed a atrial sensed ventricularly paced rhythm at 87 bpm.  Reviewed labs, imaging, and pertinent records as noted above in the HPI.  Assessment and Plan:  Acute metabolic encephalopathy Expressive aphasia Patient reported being acutely confused yesterday morning and has been having difficulty speaking ever since.  She states that she knows what she wants to say, but the words do not come out right.  CT imaging of the head did not note any acute abnormality.  She appears to be out of the window for any acute intervention.   Patient suspected to have acute stroke given persistent of  expressive aphasia. MRI of the brain was unable to be performed due to pacemaker being noted to be not compatible.  -Admit to a telemetry bed -Neurochecks -Check hemoglobin A1c, lipid panel, and TSH -Check urinalysis -PT/OT/speech to evaluate and treat -Allow for permissive hypertension -Appreciate neurology consultative services,  will follow-up for any further recommendations  Elevated troponin CAD Acute on chronic.  Patient did not report any complaints of chest pain.  High-sensitivity troponins elevated at 158-> 164.  Last cardiac cath from 02/2019 had noted SVG to OM system chronically occluded with previous PCI of native left circumflex system with other grafts widely patent. -Continue Plavix  History of candidal esophagitis Patient initially thought to have concern for pancreatitis, but symptoms thought more so secondary to candidal esophagitis.  Lipase 198, but appears to be trending down from prior as was noted to be 323 on 2/9.  Chronic respiratory failure with hypoxic  Asthma, without exacerbation Patient with prior PFTs which showed restrictive lung disease with minimal mild diffusion defect.  Patient on 3 L of oxygen at baseline. -Continue nasal cannula oxygen 2-3 L 24/7. -Albuterol nebs as needed for shortness of breath -Continue pharmacy substitution for home inhalers  Heart failure with reduced EF Chronic.  Last echocardiogram noted EF to be 45 to 50% with indeterminate diastolic parameters in [id number].  Patient does not appear to be acutely overloaded at this time. -Strict intake and output  -Daily weights  Complete heart block s/p pacemaker Paroxysmal atrial fibrillation Patient appears to be in a sinus rhythm at this time.  She does not appear to be on anticoagulation at baseline, but does have a history of atrial fibrillation/flutter documented in the past. -Given concern for stroke will need to reassess need for anticoagulation  CKD stage IIIB/IV Creatinine 2.05  which appears around patient's baseline. -Continue to monitor  Anemia of chronic kidney disease Hemoglobin 11.3 which appears improved from prior of 9.3 on 2/11. -Continue to monitor  Controlled diabetes mellitus type 2, without long-term use of insulin Glucose mildly elevated at 154. Last available hemoglobin A1c was 5.6 on 10/2021. -Carb modified diet  Thrombocytopenia Chronic.  Platelet count 124 which appears around patient's baseline. -Continue to monitor  Dyslipidemia Total cholesterol 124, HDL 41, LDL 64, triglycerides 94, VLDL 19 on 03/18/2022. -Follow-up lipid panel ordered for in a.m. -LDL at goal less than 70 -Continue Crestor  GERD -Continue Protonix and Carafate  OSA Patient unable to tolerate CPAP.  Wheelchair-bound   DVT prophylaxis: Lovenox Advance Care Planning:   Code Status: Full Code    Consults: Neurology Family Communication: none  Severity of Illness: The appropriate patient status for this patient is OBSERVATION. Observation status is judged to be reasonable and necessary in order to provide the required intensity of service to ensure the patient's safety. The patient's presenting symptoms, physical exam findings, and initial radiographic and laboratory data in the context of their medical condition is felt to place them at decreased risk for further clinical deterioration. Furthermore, it is anticipated that the patient will be medically stable for discharge from the hospital within 2 midnights of admission.   Author: Norval Morton, MD 04/21/2022 3:11 PM  For on call review [email or web address].

## 2022-04-21 NOTE — Progress Notes (Deleted)
NEUROLOGY CONSULTATION NOTE   Date of service: April 21, 2022 Patient Name: [name] MRN:  [id number] DOB:  [birth year] Reason for consult: aphasia Requesting physician: Valarie Merino, MD _ _ _   _ __   _ __ _ _  __ __   _ __   __ _  History of Present Illness   [name] is a 77 y.o. female with PMH significant for  has a past medical history of AICD (automatic cardioverter/defibrillator) present, Anemia, Arthritis, Asthma, Atrial fibrillation ([place]) (10/24/2015), CAD (coronary artery disease), Chronic lower back pain, Fatty liver disease, nonalcoholic, GERD (gastroesophageal reflux disease), Gout, H/O hiatal hernia, High cholesterol, Hyperplastic colon polyp, Hypertension, IBS (irritable bowel syndrome), Internal hemorrhoids, INTERNAL HEMORRHOIDS WITHOUT MENTION COMP (04/12/2007), Nonischemic cardiomyopathy ([place]) (11/22/2011), Obesity, OSA (obstructive sleep apnea), Presence of permanent cardiac pacemaker, Stroke ([place]) (09/2020), and Type II diabetes mellitus ([place]). who presents with confusion and aphasia.  Patient recalls feeling well yesterday morning (3/10) when she woke up at 6:30 am. She went to get breakfast and after eating noticed she could not speak and felt confused. She said this was around 9 am in the morning. Since then, she has fet worsening confusion and notified the staff at [facility] who subsequently sent her here.  At the time of interview, she no longer feels confused but is still having trouble finding words.  She reports a prior history of stroke "a long time ago." She endorses good adherence to medication regimen. She endorses L leg pain after a surgical procedure. She denies any other somatic complaints. She reports good adherence to home medications.   ROS   Per HPI: all other systems reviewed and are negative  Past History   I have reviewed the following:  Past Medical History:  Diagnosis Date   AICD (automatic cardioverter/defibrillator) present     downgraded to CRT-P in 2014   Anemia    Arthritis    Asthma    Atrial fibrillation ([place]) 10/24/2015   Questionable history of A Fib/A Flutter. On device interrogation on 9/7, showed 0.44mn of A Fib/A Flutter with 1% burden.  Device check in Jan 2018 showed no sustained AF (runs of less than 30 seconds). No anticoagulation indicated.   CAD (coronary artery disease)    a. 10/09/16 LHC: SVG->LAD patent, SVG->Diag patent, SVG->RCA patent, SVG->LCx occluded. EF 60%, b. 10/31/16 LHC DES to AV groove Circ, DES to intermed branch   Chronic lower back pain    Fatty liver disease, nonalcoholic    GERD (gastroesophageal reflux disease)    Gout    H/O hiatal hernia    High cholesterol    Hyperplastic colon polyp    Hypertension    IBS (irritable bowel syndrome)    Internal hemorrhoids    INTERNAL HEMORRHOIDS WITHOUT MENTION COMP 04/12/2007   Qualifier: Diagnosis of  By: BOlevia PerchesMD, Dora M    Nonischemic cardiomyopathy ([place] 11/22/2011   Pt responded to BiV ICD- last EF 55-60% Sept 2017   Obesity    OSA (obstructive sleep apnea)    "can't tolerate a mask", wears 2L nocturnal O2   Presence of permanent cardiac pacemaker    11/02/12 [employer] V273 INTUA PPM   Stroke ([facility] 09/2020   Type II diabetes mellitus ([place]    Past Surgical History:  Procedure Laterality Date   ABDOMINAL ULTRASOUND  12/01/2011   Peripelvic cysts- #1- 2.4x1.9x2.3cm, #2-1.2x0.9x1.2cm   ANKLE FRACTURE SURGERY Right    "had rod put in"  ANTERIOR CERVICAL DECOMP/DISCECTOMY FUSION     APPENDECTOMY     BACK SURGERY     BIOPSY  12/29/2017   Procedure: BIOPSY;  Surgeon: [name], MD;  [facility];  Service: Endoscopy;;   BIOPSY  03/20/2022   Procedure: BIOPSY;  Surgeon: Lavena Bullion, DO;  Location: [place];  Service: Gastroenterology;;   BIV ICD INSERTION CRT-D  2001?   BIV PACEMAKER GENERATOR CHANGE OUT N/A 11/02/2012   Procedure: BIV PACEMAKER GENERATOR CHANGE OUT;  Surgeon: Sanda Klein, MD;  Location: [employer] [facility] C[facility];  Service: Cardiovascular;  Laterality: N/A;   CARDIAC CATHETERIZATION  05/17/1999   No significant coronary obstructive disease w/ mild 20% luminal irregularity of the first diag branch of the LAD   CARDIAC CATHETERIZATION  07/08/2002   No significant CAD, moderately depressed LV systolic function   CARDIAC CATHETERIZATION Bilateral 04/26/2007   Normal findings, recommend medical therapy   CARDIAC CATHETERIZATION  02/18/2008   Moderate CAD, would benefit from having a functional study, recommend continue medical therapy   CARDIAC CATHETERIZATION  07/23/2012   Medical therapy   CARDIAC CATHETERIZATION N/A 11/24/2014   Procedure: Left Heart Cath and Coronary Angiography;  Surgeon: Troy Sine, MD;  Location: [place] CV LAB;  Service: Cardiovascular;  Laterality: N/A;   CARDIAC CATHETERIZATION  11/27/2014   Procedure: Intravascular Pressure Wire/FFR Study;  Surgeon: Peter M Martinique, MD;  Location: [place] CV LAB;  Service: Cardiovascular;;   CARDIAC CATHETERIZATION  10/09/2016   CHOLECYSTECTOMY N/A 04/09/2018   Procedure: LAPAROSCOPIC CHOLECYSTECTOMY;  Surgeon: Greer Pickerel, MD;  [facility];  Service: General;  Laterality: N/A;   COLONOSCOPY WITH PROPOFOL N/A 12/29/2017   Procedure: COLONOSCOPY WITH PROPOFOL;  Surgeon: [name], MD;  [facility];  Service: Endoscopy;  Laterality: N/A;   CONVERSION TO TOTAL HIP Left 02/12/2022   Procedure: NAIL REMOVAL WITH CONVERSION TO TOTAL HIP;  Surgeon: Willaim Sheng, MD;  Location: [place];  Service: Orthopedics;  Laterality: Left;   CORONARY ANGIOGRAM  2010   CORONARY ARTERY BYPASS GRAFT N/A 11/29/2014   Procedure: CORONARY ARTERY BYPASS GRAFTING x 5 (LIMA-LAD, SVG-D, SVG-OM1-OM2, SVG-PD);  Surgeon: Melrose Nakayama, MD;  Location: [place];  Service: Open Heart Surgery;  Laterality: N/A;   CORONARY STENT INTERVENTION N/A 10/31/2016   Procedure: CORONARY STENT INTERVENTION;  Surgeon:  Burnell Blanks, MD;  Location: [place] CV LAB;  Service: Cardiovascular;  Laterality: N/A;   ESOPHAGOGASTRODUODENOSCOPY (EGD) WITH PROPOFOL N/A 12/29/2017   Procedure: ESOPHAGOGASTRODUODENOSCOPY (EGD) WITH PROPOFOL;  Surgeon: [name], MD;  [facility];  Service: Endoscopy;  Laterality: N/A;   ESOPHAGOGASTRODUODENOSCOPY (EGD) WITH PROPOFOL N/A 03/20/2022   Procedure: ESOPHAGOGASTRODUODENOSCOPY (EGD) WITH PROPOFOL;  Surgeon: Lavena Bullion, DO;  Location: [place];  Service: Gastroenterology;  Laterality: N/A;   FRACTURE SURGERY     INSERT / REPLACE / REMOVE PACEMAKER  1999   INTRAMEDULLARY (IM) NAIL INTERTROCHANTERIC Left 04/04/2021   Procedure: INTRAMEDULLARY (IM) NAIL INTERTROCHANTRIC;  Surgeon: Vanetta Mulders, MD;  Location: [place];  Service: Orthopedics;  Laterality: Left;   IR FLUORO GUIDE CV LINE LEFT  04/11/2021   IR US GUIDE VASC ACCESS LEFT  04/11/2021   KNEE ARTHROSCOPY Bilateral    LEFT HEART CATH AND CORS/GRAFTS ANGIOGRAPHY N/A 12/09/2017   Procedure: LEFT HEART CATH AND CORS/GRAFTS ANGIOGRAPHY;  Surgeon: Troy Sine, MD;  Location: [place] CV LAB;  Service: Cardiovascular;  Laterality: N/A;   LEFT HEART CATHETERIZATION WITH CORONARY ANGIOGRAM  N/A 07/23/2012   Procedure: LEFT HEART CATHETERIZATION WITH CORONARY ANGIOGRAM;  Surgeon: Leonie Man, MD;  Location: [facility] C[facility] LAB;  Service: Cardiovascular;  Laterality: N/A;   LEXISCAN MYOVIEW  11/14/2011   Mild-moderate defect seen in Mid Inferolateral and Mid Anterolateral regions-consistant w/ infarct/scar. No significant ischemia demonstrated.   LUMBAR PERCUTANEOUS PEDICLE SCREW 4 LEVEL N/A 09/10/2021   Procedure: THORACIC SEVEN THROUGH THORACIC ELEVEN  PERCUTANEOUS PEDICLE SCREW PLACEMENT;  Surgeon: Judith Part, MD;  Location: [place];  Service: Neurosurgery;  Laterality: N/A;   POLYPECTOMY  12/29/2017   Procedure: POLYPECTOMY;  Surgeon: [name], MD;  [facility];   Service: Endoscopy;;   PPM GENERATOR CHANGEOUT N/A 12/31/2020   Procedure: PPM GENERATOR CHANGEOUT;  Surgeon: Sanda Klein, MD;  Location: [place] CV LAB;  Service: Cardiovascular;  Laterality: N/A;   RIGHT/LEFT HEART CATH AND CORONARY ANGIOGRAPHY N/A 10/09/2016   Procedure: RIGHT/LEFT HEART CATH AND CORONARY ANGIOGRAPHY;  Surgeon: Jolaine Artist, MD;  Location: [place] CV LAB;  Service: Cardiovascular;  Laterality: N/A;   RIGHT/LEFT HEART CATH AND CORONARY/GRAFT ANGIOGRAPHY N/A 03/11/2019   Procedure: RIGHT/LEFT HEART CATH AND CORONARY/GRAFT ANGIOGRAPHY;  Surgeon: Jolaine Artist, MD;  Location: [place] CV LAB;  Service: Cardiovascular;  Laterality: N/A;   TEE WITHOUT CARDIOVERSION N/A 11/29/2014   Procedure: TRANSESOPHAGEAL ECHOCARDIOGRAM (TEE);  Surgeon: Melrose Nakayama, MD;  Location: [place];  Service: Open Heart Surgery;  Laterality: N/A;   TRANSCAROTID ARTERY REVASCULARIZATION  Right 10/01/2020   Procedure: RIGHT TRANSCAROTID ARTERY REVASCULARIZATION;  Surgeon: [name], MD;  Location: [facility] OR;  Service: Vascular;  Laterality: Right;   TRANSTHORACIC ECHOCARDIOGRAM  07/23/2012   EF 55-60%, normal-mild   TUBAL LIGATION     ULTRASOUND GUIDANCE FOR VASCULAR ACCESS Left 10/01/2020   Procedure: ULTRASOUND GUIDANCE FOR VASCULAR ACCESS;  Surgeon: [name], MD;  Location: [place];  Service: Vascular;  Laterality: Left;   Family History  Problem Relation Age of Onset   Breast cancer Mother    Diabetes Mother    Asthma Sister    Heart disease Maternal Grandmother    Kidney disease Maternal Grandmother    Diabetes Maternal Grandmother    Glaucoma Maternal Aunt    Heart disease Maternal Aunt    Colon cancer Neg Hx    Stomach cancer Neg Hx    Pancreatic cancer Neg Hx    Social History   Socioeconomic History   Marital status: Widowed    Spouse name: Tathiana Resnik- deceased   Number of children: 5   Years of education: Not on file    Highest education level: Not on file  Occupational History   Occupation: STAFF/BUFFET    Employer: [employer]  Tobacco Use   Smoking status: Former    Packs/day: 0.25    Years: 3.00    Total pack years: 0.75    Types: Cigarettes    Quit date: 02/11/1968    Years since quitting: 54.2   Smokeless tobacco: Never  Vaping Use   Vaping Use: Never used  Substance and Sexual Activity   Alcohol use: No   Drug use: No   Sexual activity: Never  Other Topics Concern   Not on file  Social History Narrative   Widowed last year. Spouse Mike Gip Sr [id number]) died on date 2012/02/05 05/29/2022) at his home Was a [place] [employer] [place] [employer]   She lives with her two sons, Thayer Ohm    Other  sons Glyn Ade, Maryan Puls   Social Determinants of Health   Financial Resource Strain: High Risk (12/18/2016)   Overall Financial Resource Strain (CARDIA)    Difficulty of Paying Living Expenses: Very hard  Food Insecurity: No Food Insecurity (02/06/2022)   Hunger Vital Sign    Worried About Running Out of Food in the Last Year: Never true    [place] in the Last Year: Never true  Recent Concern: Food Insecurity - Food Insecurity Present (11/19/2021)   Hunger Vital Sign    Worried About Running Out of Food in the Last Year: Sometimes true    Ran Out of Food in the Last Year: Sometimes true  Transportation Needs: Unmet Transportation Needs (02/06/2022)   PRAPARE - Hydrologist (Medical): Yes    Lack of Transportation (Non-Medical): No  Physical Activity: Inactive (12/18/2016)   Exercise Vital Sign    Days of Exercise per Week: 0 days    Minutes of Exercise per Session: 0 min  Stress: Stress Concern Present (12/18/2016)   [place]    Feeling of Stress : Very much  Social Connections: Moderately Integrated (02/05/2021)   Social Connection and Isolation Panel [NHANES]    Frequency  of Communication with Friends and Family: Three times a week    Frequency of Social Gatherings with Friends and Family: Three times a week    Attends Religious Services: 1 to 4 times per year    Active Member of Clubs or Organizations: Yes    Attends Archivist Meetings: 1 to 4 times per year    Marital Status: Widowed   Allergies  Allergen Reactions   Ivp Dye [Iodinated Contrast Media] Shortness Of Breath    No reaction to PO contrast with non-ionic dye.06-25-2014/rsm   Shellfish Allergy Anaphylaxis   Sulfa Antibiotics Shortness Of Breath   Iodine Hives   Benadryl [Diphenhydramine] Other (See Comments)    "THIS DRIVES ME CRAZY AND MAKES ME FEEL LIKE I AM DYING"    Lipitor [Atorvastatin] Other (See Comments)    Myalgias    Mitigare [Colchicine] Nausea And Vomiting   Uloric [Febuxostat] Other (See Comments)    Unknown reaction Documented on MAR   Vibra-Tab [Doxycycline] Other (See Comments)    Unknown reaction Documented on MAR   Keflex [Cephalexin] Itching and Rash   Zithromax [Azithromycin] Rash    Medications   (Not in a hospital admission)    No current facility-administered medications for this encounter.  Current Outpatient Medications:    acetaminophen (TYLENOL) 325 MG tablet, Take 2 tablets (650 mg total) by mouth every 6 (six) hours as needed for mild pain (or Fever >/= 101)., Disp: , Rfl:    albuterol (PROVENTIL HFA;VENTOLIN HFA) 108 (90 Base) MCG/ACT inhaler, Inhale 2 puffs into the lungs every 6 (six) hours as needed for wheezing or shortness of breath., Disp: 1 Inhaler, Rfl: 0   albuterol (PROVENTIL) (2.5 MG/3ML) 0.083% nebulizer solution, Take 3 mLs (2.5 mg total) by nebulization every 6 (six) hours as needed for wheezing or shortness of breath., Disp: 75 mL, Rfl: 0   allopurinol (ZYLOPRIM) 100 MG tablet, Take 1 tablet (100 mg total) by mouth daily. Please contact the office to schedule appointment for additional refills. 1st Attempt. (Patient taking  differently: Take 100 mg by mouth daily.), Disp: 30 tablet, Rfl: 0   barrier cream (NON-SPECIFIED) CREA, Apply 1 Application topically See admin instructions. Apply topically  to buttocks every shift and as needed for each incontinence episode., Disp: , Rfl:    budesonide-formoterol (SYMBICORT) 160-4.5 MCG/ACT inhaler, Inhale 1 puff into the lungs 2 (two) times daily., Disp: , Rfl:    Cal Carb-Mag Hydrox-Simeth (MYLANTA COAT & COOL) 1200-270-80 MG/10ML SUSP, Take 30 mLs by mouth 3 (three) times daily as needed (gas pains)., Disp: , Rfl:    carboxymethylcellulose (REFRESH PLUS) 0.5 % SOLN, Place 1 drop into both eyes 3 (three) times daily as needed (dry eyes)., Disp: , Rfl:    clopidogrel (PLAVIX) 75 MG tablet, Take 1 tablet (75 mg total) by mouth daily., Disp: 90 tablet, Rfl: 3   cycloSPORINE (RESTASIS) 0.05 % ophthalmic emulsion, Place 1 drop into both eyes 2 (two) times daily., Disp: , Rfl:    dicyclomine (BENTYL) 10 MG capsule, Take 1 capsule (10 mg total) by mouth every 6 (six) hours as needed for spasms., Disp: 60 capsule, Rfl: 3   ferrous sulfate 325 (65 FE) MG tablet, Take 1 tablet (325 mg total) by mouth daily with breakfast., Disp: 30 tablet, Rfl: 0   fluticasone (FLONASE) 50 MCG/ACT nasal spray, Place 1 spray into both nostrils daily as needed for allergies., Disp: , Rfl:    folic acid (FOLVITE) 1 MG tablet, Take 1 tablet (1 mg total) by mouth daily., Disp: 30 tablet, Rfl: 0   furosemide (LASIX) 40 MG tablet, Take 1 tablet (40 mg total) by mouth 2 (two) times daily. 5 day course. (02/26/22-03/02/22) (Patient taking differently: Take 40 mg by mouth 2 (two) times daily.), Disp: 60 tablet, Rfl: 0   hydrOXYzine (ATARAX) 25 MG tablet, Take 25 mg by mouth 2 (two) times daily as needed for itching., Disp: , Rfl:    isosorbide mononitrate (IMDUR) 30 MG 24 hr tablet, Take 2 tablets (60 mg total) by mouth daily., Disp: 270 tablet, Rfl: 3   Lido-PE-Glycerin-Petrolatum (PREPARATION H RAPID RELIEF RE), Place  1 spray rectally every 6 (six) hours as needed (hemorrhoids)., Disp: , Rfl:    lidocaine 4 %, Place 1 patch onto the skin daily as needed (pain)., Disp: , Rfl:    linaclotide (LINZESS) 290 MCG CAPS capsule, Take 290 mcg by mouth daily before breakfast., Disp: , Rfl:    methocarbamol (ROBAXIN) 500 MG tablet, Take 500 mg by mouth every 6 (six) hours as needed for muscle spasms., Disp: , Rfl:    naloxegol oxalate (MOVANTIK) 12.5 MG TABS tablet, Take 1 tablet (12.5 mg total) by mouth daily., Disp: 30 tablet, Rfl: 3   nitroGLYCERIN (NITROSTAT) 0.4 MG SL tablet, Place 1 tablet (0.4 mg total) under the tongue every 5 (five) minutes as needed for chest pain. (Patient taking differently: Place 0.4 mg under the tongue every 5 (five) minutes x 3 doses as needed for chest pain.), Disp: 25 tablet, Rfl: 3   ondansetron (ZOFRAN) 4 MG tablet, Take 4 mg by mouth daily as needed for nausea or vomiting., Disp: , Rfl:    oxyCODONE (OXY IR/ROXICODONE) 5 MG immediate release tablet, Take 1 tablet (5 mg total) by mouth every 6 (six) hours as needed for severe pain., Disp: 10 tablet, Rfl: 0   OXYGEN, Inhale 2-3 L/min into the lungs continuous., Disp: , Rfl:    oxymetazoline (AFRIN) 0.05 % nasal spray, Place 1 spray into both nostrils 2 (two) times daily as needed for congestion., Disp: , Rfl:    pantoprazole (PROTONIX) 40 MG tablet, Take 1 tablet (40 mg total) by mouth 2 (two) times daily., Disp: 180 tablet,  Rfl: 3   polyethylene glycol powder (GLYCOLAX/MIRALAX) 17 GM/SCOOP powder, Take 17 g by mouth daily as needed (constipation)., Disp: , Rfl:    potassium chloride SA (KLOR-CON M) 20 MEQ tablet, Take 1 tablet (20 mEq total) by mouth 2 (two) times daily., Disp: 60 tablet, Rfl: 0   rosuvastatin (CRESTOR) 20 MG tablet, Take 1 tablet (20 mg total) by mouth every evening. NEED OV. (Patient taking differently: Take 20 mg by mouth every evening.), Disp: 90 tablet, Rfl: 3   simethicone (MYLICON) 80 MG chewable tablet, Chew 80 mg by  mouth 4 (four) times daily as needed for flatulence., Disp: , Rfl:    sucralfate (CARAFATE) 1 GM/10ML suspension, Take 10 mLs (1 g total) by mouth 4 (four) times daily -  with meals and at bedtime., Disp: 420 mL, Rfl: 0   vitamin B-12 (CYANOCOBALAMIN) 1000 MCG tablet, Take 1 tablet (1,000 mcg total) by mouth daily., Disp: 30 tablet, Rfl: 1  Vitals   Vitals:   04/21/22 1201 04/21/22 1430  BP: (!) 147/77 (!) 148/78  Pulse:  80  Resp: (!) 22 20  Temp: (!) 97.5 F (36.4 C)   TempSrc: Oral   SpO2: 100% 100%     There is no height or weight on file to calculate BMI.  Physical Exam   Physical Exam General: in no acute distress and well-appearing HEENT: normocephalic and atraumatic Respiratory: non-labored breathing and on 2 L nasal cannula Extremities: moving all extremities spontaneously  Neuro: Mental Status: [name] is alert; she is oriented to self, oriented to place, and oriented to situation but not oriented to time (cannot tell me the year, says it is November). Speech was clear but notably non-fluent  with evidence of expressive aphasia (could not repeat "the quick brown fox jumped over the lazy dog"). She was able to correctly name thumb and fingers but took several tries. She was able to follow 3 step commands without difficulty.  Cranial Nerves: II:  Visual fields grossly normal; pupils equal, round, reactive to light and accommodation III,IV, VI: no ptosis, extra-ocular motions intact bilaterally V,VII: smile asymmetric with notable droop on R side (patient says this is baseline from prior CVA), facial light touch sensation intact bilaterally VIII: hearing intact to voice IX,X: uvula rises symmetrically XI: shoulder symmetrically elevate bilaterally XII: tongue extension deviates to R side without atrophy and without fasciculations  Motor: Upper extremities: Right 5/5 full power Left 5/5 full power  Lower extremities: Right 4/5 full range of motion against  gravity and offers some resistance Left 3/5 full range of motion against gravity (patient reports chronic pain on this leg where she got a prior surgery)  Tone and bulk: normal tone throughout; no atrophy noted  Sensory: sensation to light touch intact throughout bilaterally  Cerebellar: Finger-to-nose test normal  Gait: not observed during encounter   Labs   CBC:  Recent Labs  Lab 04/21/22 1214  WBC 6.9  NEUTROABS 5.2  HGB 11.3*  HCT 37.5  MCV 92.8  PLT 124*    Basic Metabolic Panel:  Lab Results  Component Value Date   NA 135 04/21/2022   K 5.0 04/21/2022   CO2 18 (L) 04/21/2022   GLUCOSE 154 (H) 04/21/2022   BUN 28 (H) 04/21/2022   CREATININE 2.05 (H) 04/21/2022   CALCIUM 10.1 04/21/2022   GFRNONAA 24 (L) 04/21/2022   GFRAA 25 (L) 06/02/2019   Lipid Panel:  Lab Results  Component Value Date   LDLCALC 64 03/18/2022  HgbA1c:  Lab Results  Component Value Date   HGBA1C 5.6 11/01/2021   Urine Drug Screen: No results found for: "LABOPIA", "COCAINSCRNUR", "LABBENZ", "AMPHETMU", "THCU", "LABBARB"  Alcohol Level     Component Value Date/Time   ETH <10 04/21/2022 1213    CT Head without contrast (3/11): IMPRESSION: No acute intracranial abnormality.  MRI Brain pending  Impression  [name] is a 77 y.o. female with PMH significant for  has a past medical history Atrial fibrillation, CAD (coronary artery disease), High cholesterol, Hyperplastic colon polyp, Hypertension, Obesity, OSA (obstructive sleep apnea), prior CVA, and Type II diabetes mellitus ([place]) who presents with confusion and aphasia.  On evaluation, patient has notable expressive aphasia and some other focal neurologic findings (R-sided facial droop, tongue deviation to R side). She also has mild disorientation (unable to tell me year or month).  Some of these focal findings are likely related to her previous CVA on 09/2020 which the patient confirms. Her L leg weakness is explained by  her chronic pain. The expressive aphasia and transient but resolving confusion is probably acute and may be undetected by CT head imaging which shows no acute process. Given last known normal was > 4.5 hours, patient is outside the window of TNK therapy. Will need confirmatory MRI once pacemaker cleared. Will pursue symptom management, rehabilitation, and stroke prophylaxis at this time.  Of note, patient reports good compliance with medications including clopidogrel 75 mg daily. Also has history of taking aspirin. Per cardiology notes, not candidate for DOAC for history of A-fib/Aflutter.  Recommendations  - PT/OT/SLP eval and treat - f/u MRI - f/u lipid panel, A1c, TSH - permissive hypertension (OK if < 220/120) but gradually normalize in 5-7 days  - continue rosuvastatin 20 mg daily - last echo 08/05/2021 EF 45- 50%, repeat echo? - on clopidogrel 75 mg at home. Start DAPT (aspirin + clopidogrel) or switch agents? ______________________________________________________________________   Thank you for the opportunity to take part in the care of this patient. If you have any further questions, please contact the neurology consultation attending.  Signed,  Camelia Phenes, MD PGY-1 Resident

## 2022-04-21 NOTE — Progress Notes (Signed)
Received from ED on stretcher.  Oriented to room and surroundings NIHSS completed

## 2022-04-21 NOTE — Consult Note (Addendum)
NEUROLOGY CONSULTATION NOTE    Date of service: April 21, 2022 Patient Name: [name] MRN:  [id number] DOB:  [birth year] Reason for consult: aphasia Requesting physician: Valarie Merino, MD _ _ _   _ __   _ __ _ _  __ __   _ __   __ _   History of Present Illness    [name] is a 77 y.o. female with PMH significant for  has a past medical history of AICD (automatic cardioverter/defibrillator) present, Anemia, Arthritis, Asthma, Atrial fibrillation ([place]) (10/24/2015), CAD (coronary artery disease), Chronic lower back pain, Fatty liver disease, nonalcoholic, GERD (gastroesophageal reflux disease), Gout, H/O hiatal hernia, High cholesterol, Hyperplastic colon polyp, Hypertension, IBS (irritable bowel syndrome), Internal hemorrhoids, INTERNAL HEMORRHOIDS WITHOUT MENTION COMP (04/12/2007), Nonischemic cardiomyopathy ([place]) (11/22/2011), Obesity, OSA (obstructive sleep apnea), Presence of permanent cardiac pacemaker, Stroke ([place]) (09/2020), and Type II diabetes mellitus ([place]). who presents with confusion and aphasia.   Patient recalls feeling well yesterday morning (3/10) when she woke up at 6:30 am. She went to get breakfast and after eating noticed she could not speak and felt confused. She said this was around 9 am in the morning. Since then, she has fet worsening confusion and notified the staff at [facility] who subsequently sent her here.   At the time of interview, she no longer feels confused but is still having trouble finding words.   She reports a prior history of stroke "a long time ago." She endorses good adherence to medication regimen. She endorses L leg pain after a surgical procedure. She denies any other somatic complaints. She reports good adherence to home medications.   ROS    Per HPI: all other systems reviewed and are negative   Past History    I have reviewed the following:       Past Medical History:  Diagnosis Date   AICD (automatic  cardioverter/defibrillator) present      downgraded to CRT-P in 2014   Anemia     Arthritis     Asthma     Atrial fibrillation ([name]) 10/24/2015    Questionable history of A Fib/A Flutter. On device interrogation on 9/7, showed 0.34mn of A Fib/A Flutter with 1% burden.  Device check in Jan 2018 showed no sustained AF (runs of less than 30 seconds). No anticoagulation indicated.   CAD (coronary artery disease)      a. 10/09/16 LHC: SVG->LAD patent, SVG->Diag patent, SVG->RCA patent, SVG->LCx occluded. EF 60%, b. 10/31/16 LHC DES to AV groove Circ, DES to intermed branch   Chronic lower back pain     Fatty liver disease, nonalcoholic     GERD (gastroesophageal reflux disease)     Gout     H/O hiatal hernia     High cholesterol     Hyperplastic colon polyp     Hypertension     IBS (irritable bowel syndrome)     Internal hemorrhoids     INTERNAL HEMORRHOIDS WITHOUT MENTION COMP 04/12/2007    Qualifier: Diagnosis of  By: BOlevia PerchesMD, Dora M    Nonischemic cardiomyopathy ([place] 11/22/2011    Pt responded to BiV ICD- last EF 55-60% Sept 2017   Obesity     OSA (obstructive sleep apnea)      "can't tolerate a mask", wears 2L nocturnal O2   Presence of permanent cardiac pacemaker      11/02/12 [employer] V273 INTUA PPM   Stroke ([name] 09/2020   Type II diabetes mellitus (  [name])           Past Surgical History:  Procedure Laterality Date   ABDOMINAL ULTRASOUND   12/01/2011    Peripelvic cysts- #1- 2.4x1.9x2.3cm, #2-1.2x0.9x1.2cm   ANKLE FRACTURE SURGERY Right      "had rod put in"   ANTERIOR CERVICAL DECOMP/DISCECTOMY FUSION       APPENDECTOMY       BACK SURGERY       BIOPSY   12/29/2017    Procedure: BIOPSY;  Surgeon: [name], MD;  [facility];  Service: Endoscopy;;   BIOPSY   03/20/2022    Procedure: BIOPSY;  Surgeon: Lavena Bullion, DO;  Location: [place] [facility];  Service: Gastroenterology;;   BIV ICD INSERTION CRT-D   2001?   BIV PACEMAKER GENERATOR CHANGE  OUT N/A 11/02/2012    Procedure: BIV PACEMAKER GENERATOR CHANGE OUT;  Surgeon: Sanda Klein, MD;  Location: [place] C[facility] LAB;  Service: Cardiovascular;  Laterality: N/A;   CARDIAC CATHETERIZATION   05/17/1999    No significant coronary obstructive disease w/ mild 20% luminal irregularity of the first diag branch of the LAD   CARDIAC CATHETERIZATION   07/08/2002    No significant CAD, moderately depressed LV systolic function   CARDIAC CATHETERIZATION Bilateral 04/26/2007    Normal findings, recommend medical therapy   CARDIAC CATHETERIZATION   02/18/2008    Moderate CAD, would benefit from having a functional study, recommend continue medical therapy   CARDIAC CATHETERIZATION   07/23/2012    Medical therapy   CARDIAC CATHETERIZATION N/A 11/24/2014    Procedure: Left Heart Cath and Coronary Angiography;  Surgeon: Troy Sine, MD;  Location: [place] CV LAB;  Service: Cardiovascular;  Laterality: N/A;   CARDIAC CATHETERIZATION   11/27/2014    Procedure: Intravascular Pressure Wire/FFR Study;  Surgeon: Peter M Martinique, MD;  Location: [place] CV LAB;  Service: Cardiovascular;;   CARDIAC CATHETERIZATION   10/09/2016   CHOLECYSTECTOMY N/A 04/09/2018    Procedure: LAPAROSCOPIC CHOLECYSTECTOMY;  Surgeon: Greer Pickerel, MD;  [facility];  Service: General;  Laterality: N/A;   COLONOSCOPY WITH PROPOFOL N/A 12/29/2017    Procedure: COLONOSCOPY WITH PROPOFOL;  Surgeon: [name], MD;  [facility];  Service: Endoscopy;  Laterality: N/A;   CONVERSION TO TOTAL HIP Left 02/12/2022    Procedure: NAIL REMOVAL WITH CONVERSION TO TOTAL HIP;  Surgeon: Willaim Sheng, MD;  Location: [place];  Service: Orthopedics;  Laterality: Left;   CORONARY ANGIOGRAM   2010   CORONARY ARTERY BYPASS GRAFT N/A 11/29/2014    Procedure: CORONARY ARTERY BYPASS GRAFTING x 5 (LIMA-LAD, SVG-D, SVG-OM1-OM2, SVG-PD);  Surgeon: Melrose Nakayama, MD;  Location: [place];  Service: Open Heart Surgery;  Laterality:  N/A;   CORONARY STENT INTERVENTION N/A 10/31/2016    Procedure: CORONARY STENT INTERVENTION;  Surgeon: Burnell Blanks, MD;  Location: [place] CV LAB;  Service: Cardiovascular;  Laterality: N/A;   ESOPHAGOGASTRODUODENOSCOPY (EGD) WITH PROPOFOL N/A 12/29/2017    Procedure: ESOPHAGOGASTRODUODENOSCOPY (EGD) WITH PROPOFOL;  Surgeon: [name], MD;  [facility];  Service: Endoscopy;  Laterality: N/A;   ESOPHAGOGASTRODUODENOSCOPY (EGD) WITH PROPOFOL N/A 03/20/2022    Procedure: ESOPHAGOGASTRODUODENOSCOPY (EGD) WITH PROPOFOL;  Surgeon: Lavena Bullion, DO;  Location: [place];  Service: Gastroenterology;  Laterality: N/A;   FRACTURE SURGERY       INSERT / REPLACE / REMOVE PACEMAKER   1999   INTRAMEDULLARY (IM) NAIL INTERTROCHANTERIC Left 04/04/2021    Procedure: INTRAMEDULLARY (IM) NAIL  INTERTROCHANTRIC;  Surgeon: Vanetta Mulders, MD;  Location: [place];  Service: Orthopedics;  Laterality: Left;   IR FLUORO GUIDE CV LINE LEFT   04/11/2021   IR US GUIDE VASC ACCESS LEFT   04/11/2021   KNEE ARTHROSCOPY Bilateral     LEFT HEART CATH AND CORS/GRAFTS ANGIOGRAPHY N/A 12/09/2017    Procedure: LEFT HEART CATH AND CORS/GRAFTS ANGIOGRAPHY;  Surgeon: Troy Sine, MD;  Location: [place] CV LAB;  Service: Cardiovascular;  Laterality: N/A;   LEFT HEART CATHETERIZATION WITH CORONARY ANGIOGRAM N/A 07/23/2012    Procedure: LEFT HEART CATHETERIZATION WITH CORONARY ANGIOGRAM;  Surgeon: Leonie Man, MD;  [facility];  Service: Cardiovascular;  Laterality: N/A;   LEXISCAN MYOVIEW   11/14/2011    Mild-moderate defect seen in Mid Inferolateral and Mid Anterolateral regions-consistant w/ infarct/scar. No significant ischemia demonstrated.   LUMBAR PERCUTANEOUS PEDICLE SCREW 4 LEVEL N/A 09/10/2021    Procedure: THORACIC SEVEN THROUGH THORACIC ELEVEN  PERCUTANEOUS PEDICLE SCREW PLACEMENT;  Surgeon: Judith Part, MD;  Location: [place];  Service: Neurosurgery;  Laterality:  N/A;   POLYPECTOMY   12/29/2017    Procedure: POLYPECTOMY;  Surgeon: [name], MD;  [facility];  Service: Endoscopy;;   PPM GENERATOR CHANGEOUT N/A 12/31/2020    Procedure: PPM GENERATOR CHANGEOUT;  Surgeon: Sanda Klein, MD;  Location: [place] CV LAB;  Service: Cardiovascular;  Laterality: N/A;   RIGHT/LEFT HEART CATH AND CORONARY ANGIOGRAPHY N/A 10/09/2016    Procedure: RIGHT/LEFT HEART CATH AND CORONARY ANGIOGRAPHY;  Surgeon: Jolaine Artist, MD;  Location: [place] CV LAB;  Service: Cardiovascular;  Laterality: N/A;   RIGHT/LEFT HEART CATH AND CORONARY/GRAFT ANGIOGRAPHY N/A 03/11/2019    Procedure: RIGHT/LEFT HEART CATH AND CORONARY/GRAFT ANGIOGRAPHY;  Surgeon: Jolaine Artist, MD;  Location: [name];  Service: Cardiovascular;  Laterality: N/A;   TEE WITHOUT CARDIOVERSION N/A 11/29/2014    Procedure: TRANSESOPHAGEAL ECHOCARDIOGRAM (TEE);  Surgeon: Melrose Nakayama, MD;  Location: [place];  Service: Open Heart Surgery;  Laterality: N/A;   TRANSCAROTID ARTERY REVASCULARIZATION  Right 10/01/2020    Procedure: RIGHT TRANSCAROTID ARTERY REVASCULARIZATION;  Surgeon: [name], MD;  Location: [facility] OR;  Service: Vascular;  Laterality: Right;   TRANSTHORACIC ECHOCARDIOGRAM   07/23/2012    EF 55-60%, normal-mild   TUBAL LIGATION       ULTRASOUND GUIDANCE FOR VASCULAR ACCESS Left 10/01/2020    Procedure: ULTRASOUND GUIDANCE FOR VASCULAR ACCESS;  Surgeon: [name], MD;  Location: [place];  Service: Vascular;  Laterality: Left;         Family History  Problem Relation Age of Onset   Breast cancer Mother     Diabetes Mother     Asthma Sister     Heart disease Maternal Grandmother     Kidney disease Maternal Grandmother     Diabetes Maternal Grandmother     Glaucoma Maternal Aunt     Heart disease Maternal Aunt     Colon cancer Neg Hx     Stomach cancer Neg Hx     Pancreatic cancer Neg Hx      Social History          Socioeconomic History   Marital status: Widowed      Spouse name: Latrenda Temores- deceased   Number of children: 5   Years of education: Not on file   Highest education level: Not on file  Occupational History   Occupation: STAFF/BUFFET  Employer: [employer]  Tobacco Use   Smoking status: Former      Packs/day: 0.25      Years: 3.00      Total pack years: 0.75      Types: Cigarettes      Quit date: 02/11/1968      Years since quitting: 54.2   Smokeless tobacco: Never  Vaping Use   Vaping Use: Never used  Substance and Sexual Activity   Alcohol use: No   Drug use: No   Sexual activity: Never  Other Topics Concern   Not on file  Social History Narrative    Widowed last year. Spouse Mike Gip Sr [id number]) died on date 01-17-12 05-10-22) at his home Was a [place] [employer] -[place]    She lives with her two sons, Thayer Ohm     Other sons Haze Rushing    Social Determinants of Health        Financial Resource Strain: High Risk (12/18/2016)    Overall Financial Resource Strain (CARDIA)     Difficulty of Paying Living Expenses: Very hard  Food Insecurity: No Food Insecurity (02/06/2022)    Hunger Vital Sign     Worried About Running Out of Food in the Last Year: Never true     [place] in the Last Year: Never true  Recent Concern: Food Insecurity - Food Insecurity Present (11/19/2021)    Hunger Vital Sign     Worried About Running Out of Food in the Last Year: Sometimes true     Ran Out of Food in the Last Year: Sometimes true  Transportation Needs: Unmet Transportation Needs (02/06/2022)    PRAPARE - Armed forces logistics/support/administrative officer (Medical): Yes     Lack of Transportation (Non-Medical): No  Physical Activity: Inactive (12/18/2016)    Exercise Vital Sign     Days of Exercise per Week: 0 days     Minutes of Exercise per Session: 0 min  Stress: Stress Concern Present (12/18/2016)    [place]     Feeling of Stress : Very much  Social Connections: Moderately Integrated (02/05/2021)    Social Connection and Isolation Panel [NHANES]     Frequency of Communication with Friends and Family: Three times a week     Frequency of Social Gatherings with Friends and Family: Three times a week     Attends Religious Services: 1 to 4 times per year     Active Member of Clubs or Organizations: Yes     Attends Archivist Meetings: 1 to 4 times per year     Marital Status: Widowed         Allergies  Allergen Reactions   Ivp Dye [Iodinated Contrast Media] Shortness Of Breath      No reaction to PO contrast with non-ionic dye.06-25-2014/rsm   Shellfish Allergy Anaphylaxis   Sulfa Antibiotics Shortness Of Breath   Iodine Hives   Benadryl [Diphenhydramine] Other (See Comments)      "THIS DRIVES ME CRAZY AND MAKES ME FEEL LIKE I AM DYING"     Lipitor [Atorvastatin] Other (See Comments)      Myalgias    Mitigare [Colchicine] Nausea And Vomiting   Uloric [Febuxostat] Other (See Comments)      Unknown reaction Documented on [facility]   Vibra-Tab [Doxycycline] Other (See Comments)      Unknown reaction Documented on [facility]  Keflex [Cephalexin] Itching and Rash   Zithromax [Azithromycin] Rash      Medications    (Not in a hospital admission)     No current facility-administered medications for this encounter.   Current Outpatient Medications:    acetaminophen (TYLENOL) 325 MG tablet, Take 2 tablets (650 mg total) by mouth every 6 (six) hours as needed for mild pain (or Fever >/= 101)., Disp: , Rfl:    albuterol (PROVENTIL HFA;VENTOLIN HFA) 108 (90 Base) MCG/ACT inhaler, Inhale 2 puffs into the lungs every 6 (six) hours as needed for wheezing or shortness of breath., Disp: 1 Inhaler, Rfl: 0   albuterol (PROVENTIL) (2.5 MG/3ML) 0.083% nebulizer solution, Take 3 mLs (2.5 mg total) by nebulization every 6 (six) hours as needed for  wheezing or shortness of breath., Disp: 75 mL, Rfl: 0   allopurinol (ZYLOPRIM) 100 MG tablet, Take 1 tablet (100 mg total) by mouth daily. Please contact the office to schedule appointment for additional refills. 1st Attempt. (Patient taking differently: Take 100 mg by mouth daily.), Disp: 30 tablet, Rfl: 0   barrier cream (NON-SPECIFIED) CREA, Apply 1 Application topically See admin instructions. Apply topically to buttocks every shift and as needed for each incontinence episode., Disp: , Rfl:    budesonide-formoterol (SYMBICORT) 160-4.5 MCG/ACT inhaler, Inhale 1 puff into the lungs 2 (two) times daily., Disp: , Rfl:    Cal Carb-Mag Hydrox-Simeth (MYLANTA COAT & COOL) 1200-270-80 MG/10ML SUSP, Take 30 mLs by mouth 3 (three) times daily as needed (gas pains)., Disp: , Rfl:    carboxymethylcellulose (REFRESH PLUS) 0.5 % SOLN, Place 1 drop into both eyes 3 (three) times daily as needed (dry eyes)., Disp: , Rfl:    clopidogrel (PLAVIX) 75 MG tablet, Take 1 tablet (75 mg total) by mouth daily., Disp: 90 tablet, Rfl: 3   cycloSPORINE (RESTASIS) 0.05 % ophthalmic emulsion, Place 1 drop into both eyes 2 (two) times daily., Disp: , Rfl:    dicyclomine (BENTYL) 10 MG capsule, Take 1 capsule (10 mg total) by mouth every 6 (six) hours as needed for spasms., Disp: 60 capsule, Rfl: 3   ferrous sulfate 325 (65 FE) MG tablet, Take 1 tablet (325 mg total) by mouth daily with breakfast., Disp: 30 tablet, Rfl: 0   fluticasone (FLONASE) 50 MCG/ACT nasal spray, Place 1 spray into both nostrils daily as needed for allergies., Disp: , Rfl:    folic acid (FOLVITE) 1 MG tablet, Take 1 tablet (1 mg total) by mouth daily., Disp: 30 tablet, Rfl: 0   furosemide (LASIX) 40 MG tablet, Take 1 tablet (40 mg total) by mouth 2 (two) times daily. 5 day course. (02/26/22-03/02/22) (Patient taking differently: Take 40 mg by mouth 2 (two) times daily.), Disp: 60 tablet, Rfl: 0   hydrOXYzine (ATARAX) 25 MG tablet, Take 25 mg by mouth 2 (two)  times daily as needed for itching., Disp: , Rfl:    isosorbide mononitrate (IMDUR) 30 MG 24 hr tablet, Take 2 tablets (60 mg total) by mouth daily., Disp: 270 tablet, Rfl: 3   Lido-PE-Glycerin-Petrolatum (PREPARATION H RAPID RELIEF RE), Place 1 spray rectally every 6 (six) hours as needed (hemorrhoids)., Disp: , Rfl:    lidocaine 4 %, Place 1 patch onto the skin daily as needed (pain)., Disp: , Rfl:    linaclotide (LINZESS) 290 MCG CAPS capsule, Take 290 mcg by mouth daily before breakfast., Disp: , Rfl:    methocarbamol (ROBAXIN) 500 MG tablet, Take 500 mg by mouth every 6 (six) hours as needed  for muscle spasms., Disp: , Rfl:    naloxegol oxalate (MOVANTIK) 12.5 MG TABS tablet, Take 1 tablet (12.5 mg total) by mouth daily., Disp: 30 tablet, Rfl: 3   nitroGLYCERIN (NITROSTAT) 0.4 MG SL tablet, Place 1 tablet (0.4 mg total) under the tongue every 5 (five) minutes as needed for chest pain. (Patient taking differently: Place 0.4 mg under the tongue every 5 (five) minutes x 3 doses as needed for chest pain.), Disp: 25 tablet, Rfl: 3   ondansetron (ZOFRAN) 4 MG tablet, Take 4 mg by mouth daily as needed for nausea or vomiting., Disp: , Rfl:    oxyCODONE (OXY IR/ROXICODONE) 5 MG immediate release tablet, Take 1 tablet (5 mg total) by mouth every 6 (six) hours as needed for severe pain., Disp: 10 tablet, Rfl: 0   OXYGEN, Inhale 2-3 L/min into the lungs continuous., Disp: , Rfl:    oxymetazoline (AFRIN) 0.05 % nasal spray, Place 1 spray into both nostrils 2 (two) times daily as needed for congestion., Disp: , Rfl:    pantoprazole (PROTONIX) 40 MG tablet, Take 1 tablet (40 mg total) by mouth 2 (two) times daily., Disp: 180 tablet, Rfl: 3   polyethylene glycol powder (GLYCOLAX/MIRALAX) 17 GM/SCOOP powder, Take 17 g by mouth daily as needed (constipation)., Disp: , Rfl:    potassium chloride SA (KLOR-CON M) 20 MEQ tablet, Take 1 tablet (20 mEq total) by mouth 2 (two) times daily., Disp: 60 tablet, Rfl: 0    rosuvastatin (CRESTOR) 20 MG tablet, Take 1 tablet (20 mg total) by mouth every evening. NEED OV. (Patient taking differently: Take 20 mg by mouth every evening.), Disp: 90 tablet, Rfl: 3   simethicone (MYLICON) 80 MG chewable tablet, Chew 80 mg by mouth 4 (four) times daily as needed for flatulence., Disp: , Rfl:    sucralfate (CARAFATE) 1 GM/10ML suspension, Take 10 mLs (1 g total) by mouth 4 (four) times daily -  with meals and at bedtime., Disp: 420 mL, Rfl: 0   vitamin B-12 (CYANOCOBALAMIN) 1000 MCG tablet, Take 1 tablet (1,000 mcg total) by mouth daily., Disp: 30 tablet, Rfl: 1   Vitals        Vitals:    04/21/22 1201 04/21/22 1430  BP: (!) 147/77 (!) 148/78  Pulse:   80  Resp: (!) 22 20  Temp: (!) 97.5 F (36.4 C)    TempSrc: Oral    SpO2: 100% 100%      There is no height or weight on file to calculate BMI.   Physical Exam    Physical Exam General: in no acute distress and well-appearing HEENT: normocephalic and atraumatic Respiratory: non-labored breathing and on 2 L nasal cannula Extremities: moving all extremities spontaneously   Neuro: Mental Status: MIRAL BREHENY is alert; she is oriented to self, oriented to place, and oriented to situation but not oriented to time (cannot tell me the year, says it is November). Speech was clear but notably non-fluent  with evidence of expressive aphasia (could not repeat "the quick brown fox jumped over the lazy dog"). She was able to correctly name thumb and fingers but took several tries. She was able to follow 3 step commands without difficulty.   Cranial Nerves: II:  Visual fields grossly normal; pupils equal, round, reactive to light and accommodation III,IV, VI: no ptosis, extra-ocular motions intact bilaterally V,VII: smile asymmetric with notable droop on R side (patient says this is baseline from prior CVA), facial light touch sensation intact bilaterally VIII: hearing intact  to voice IX,X: uvula rises  symmetrically XI: shoulder symmetrically elevate bilaterally XII: tongue extension deviates to R side without atrophy and without fasciculations   Motor: Upper extremities: Right 5/5 full power Left 5/5 full power   Lower extremities: Right 4/5 full range of motion against gravity and offers some resistance Left 3/5 full range of motion against gravity (patient reports chronic pain on this leg where she got a prior surgery)   Tone and bulk: normal tone throughout; no atrophy noted   Sensory: sensation to light touch intact throughout bilaterally   Cerebellar: Finger-to-nose test normal   Gait: not observed during encounter     Labs    CBC:  Last Labs     Recent Labs  Lab 04/21/22 1214  WBC 6.9  NEUTROABS 5.2  HGB 11.3*  HCT 37.5  MCV 92.8  PLT 124*        Basic Metabolic Panel:  Recent Labs       Lab Results  Component Value Date    NA 135 04/21/2022    K 5.0 04/21/2022    CO2 18 (L) 04/21/2022    GLUCOSE 154 (H) 04/21/2022    BUN 28 (H) 04/21/2022    CREATININE 2.05 (H) 04/21/2022    CALCIUM 10.1 04/21/2022    GFRNONAA 24 (L) 04/21/2022    GFRAA 25 (L) 06/02/2019      Lipid Panel:  Recent Labs       Lab Results  Component Value Date    LDLCALC 64 03/18/2022      HgbA1c:  Recent Labs       Lab Results  Component Value Date    HGBA1C 5.6 11/01/2021      Urine Drug Screen:  Labs (Brief)  No results found for: "LABOPIA", "COCAINSCRNUR", "LABBENZ", "AMPHETMU", "THCU", "LABBARB"    Alcohol Level  Labs (Brief)          Component Value Date/Time    ETH <10 04/21/2022 1213        CT Head without contrast (3/11): IMPRESSION: No acute intracranial abnormality.   MRI Brain pending   Impression  [name] is a 77 y.o. female with PMH significant for  has a past medical history Atrial fibrillation, CAD (coronary artery disease), High cholesterol, Hyperplastic colon polyp, Hypertension, Obesity, OSA (obstructive sleep apnea), prior  CVA, and Type II diabetes mellitus ([place]) who presents with confusion and aphasia.   On evaluation, patient has notable expressive and some receptive aphasia.   Some of these focal findings are likely related to her previous CVA on 09/2020 which the patient confirms. Her L leg weakness is explained by her chronic pain. The expressive aphasia and transient but resolving confusion is probably acute and may be undetected by CT head imaging which shows no acute process. Given last known normal was > 4.5 hours, patient is outside the window of TNK therapy. She is also outside the thrombectomy window. Will need confirmatory MRI once pacemaker cleared. Will pursue symptom management, rehabilitation, and stroke prophylaxis at this time.   Of note, pacemaker interrogation from Nov 2023 did show Afibb for about 20 mins.   Recommendations  Plan:  - Frequent Neuro checks per stroke unit protocol - Recommend brain imaging with MRI Brain without contrast - Recommend vessel imaging with MR angio head without contrast and Vasc US carotid duplex.(She is allergic to iodinated contrast). - Unlikely for TTE to change management, given known hx of Afibb on PPM interrogation. - Recommend obtaining Lipid panel with  LDL - continue home Rosuvastatin. Can witch or uptitrate based on LDL. - Recommend HbA1c to evaluate for diabetes and how well it is controlled. - Antithrombotic - continue DAPT. Supposed to be on lifelong DAPT per cardiology notes from aug 2023? - Recommend DVT ppx - would benefit from Anticoagulation in the long run given Afibb on last PPM interrogation from Nov 2023. Timing of AC based on MRI brain. Will need to discuss with cardiology the elevated risk of hemorrhage specially if she is on [facility] as well as DAPT. - SBP goal - permisive hypertension tonight to 220/110. Gradual normotension thereafter. - Recommend Telemetry monitoring for arrythmia - Recommend bedside swallow screen prior to PO intake. -  Stroke education booklet - Recommend PT/OT/SLP consult - Recommend Urine Tox screen.   ______________________________________________________________________     Thank you for the opportunity to take part in the care of this patient. If you have any further questions, please contact the neurology consultation attending.   Signed,   Camelia Phenes, MD PGY-1 Resident  NEUROHOSPITALIST ADDENDUM Performed a face to face diagnostic evaluation.   I have reviewed the contents of history and physical exam as documented by PA/ARNP/Resident and agree with above documentation.  I have discussed and formulated the above plan as documented. Edits to the note have been made as needed.  Impression/Key exam findings/Plan: went to bed around 9-10PM on 04/19/22 and woke up at 0630 on 04/20/22. Did not talk to anyone until around 71 when she spoke to her brother and could not talk without getting stuck. Came to the ED when this persisted.  LKW: 2100 on 04/19/22 mRS: 3(assisted living, they serve meals, help bathe) tNKASE: not offered, outside window Thrombectomy: not offered, outside window. NIHSS components Score: Comment  1a Level of Conscious 0'[x]'$  1'[]'$  2'[]'$  3'[]'$      1b LOC Questions 0'[]'$  1'[]'$  2'[x]'$       1c LOC Commands 0'[x]'$  1'[]'$  2'[]'$       2 Best Gaze 0'[x]'$  1'[]'$  2'[]'$       3 Visual 0'[x]'$  1'[]'$  2'[]'$  3'[]'$      4 Facial Palsy 0'[x]'$  1'[]'$  2'[]'$  3'[]'$      5a Motor Arm - left 0'[x]'$  1'[]'$  2'[]'$  3'[]'$  4'[]'$  UN'[]'$    5b Motor Arm - Right 0'[x]'$  1'[]'$  2'[]'$  3'[]'$  4'[]'$  UN'[]'$    6a Motor Leg - Left 0'[x]'$  1'[]'$  2'[]'$  3'[]'$  4'[]'$  UN'[]'$    6b Motor Leg - Right 0'[x]'$  1'[]'$  2'[]'$  3'[]'$  4'[]'$  UN'[]'$    7 Limb Ataxia 0'[x]'$  1'[]'$  2'[]'$  3'[]'$  UN'[]'$     8 Sensory 0'[x]'$  1'[]'$  2'[]'$  UN'[]'$      9 Best Language 0'[]'$  1'[]'$  2'[x]'$  3'[]'$      10 Dysarthria 0'[x]'$  1'[]'$  2'[]'$  UN'[]'$      11 Extinct. and Inattention 0'[x]'$  1'[]'$  2'[]'$       TOTAL: 4    Impression: High suspicion for a stroke, likely a cortical infarct in the left MCA distribution. No weakness or numbness, no other focal deficit.  Plan as above.  Donnetta Simpers,  MD [facility] [id number]   If 7pm to 7am, please call on call as listed on AMION.

## 2022-04-21 NOTE — ED Provider Notes (Signed)
[facility] Provider Note   CSN: [id number] Arrival date & time: 04/21/22  1147     History  Chief Complaint  Patient presents with   Altered Mental Status    IO DONNEL is a 77 y.o. female.  77 year old female with prior medical history as detailed below presents for evaluation.  Patient arrives with EMS from [facility].  Patient reported to staff at [facility] that she felt "confused" today.  Patient reports to this provider that she feels like her "confusion" began sometime yesterday or perhaps over the weekend.  She reports that she feels like she is having trouble getting her words out correctly.  She denies acute pain, fever, shortness of breath, abdominal pain, focal weakness.  Last known well is unclear.  Prior medical history significant for asthma, atrial fibrillation, coronary artery disease, status post PCI and stent, GERD, gout, hypertension, dyslipidemia and OSA as well as CVA and type 2 diabetes mellitus.   The history is provided by the patient and medical records.       Home Medications Prior to Admission medications   Medication Sig Start Date End Date Taking? Authorizing Provider  acetaminophen (TYLENOL) 325 MG tablet Take 2 tablets (650 mg total) by mouth every 6 (six) hours as needed for mild pain (or Fever >/= 101). 03/24/22   Elgergawy, Silver Huguenin, MD  albuterol (PROVENTIL HFA;VENTOLIN HFA) 108 (90 Base) MCG/ACT inhaler Inhale 2 puffs into the lungs every 6 (six) hours as needed for wheezing or shortness of breath. 05/02/18   Norval Morton, MD  albuterol (PROVENTIL) (2.5 MG/3ML) 0.083% nebulizer solution Take 3 mLs (2.5 mg total) by nebulization every 6 (six) hours as needed for wheezing or shortness of breath. 05/02/18   Norval Morton, MD  allopurinol (ZYLOPRIM) 100 MG tablet Take 1 tablet (100 mg total) by mouth daily. Please contact the office to schedule appointment for additional refills. 1st  Attempt. Patient taking differently: Take 100 mg by mouth daily. 12/05/21   Croitoru, [name], MD  barrier cream (NON-SPECIFIED) CREA Apply 1 Application topically See admin instructions. Apply topically to buttocks every shift and as needed for each incontinence episode.    [provider]  budesonide-formoterol (SYMBICORT) 160-4.5 MCG/ACT inhaler Inhale 1 puff into the lungs 2 (two) times daily.    [provider]  Cal Carb-Mag Hydrox-Simeth (MYLANTA COAT & COOL) 1200-270-80 MG/10ML SUSP Take 30 mLs by mouth 3 (three) times daily as needed (gas pains).    [provider]  carboxymethylcellulose (REFRESH PLUS) 0.5 % SOLN Place 1 drop into both eyes 3 (three) times daily as needed (dry eyes).    [provider]  clopidogrel (PLAVIX) 75 MG tablet Take 1 tablet (75 mg total) by mouth daily. 10/23/21   Croitoru, [name], MD  cycloSPORINE (RESTASIS) 0.05 % ophthalmic emulsion Place 1 drop into both eyes 2 (two) times daily.    [provider]  dicyclomine (BENTYL) 10 MG capsule Take 1 capsule (10 mg total) by mouth every 6 (six) hours as needed for spasms. 04/08/22   Cirigliano, Vito V, DO  ferrous sulfate 325 (65 FE) MG tablet Take 1 tablet (325 mg total) by mouth daily with breakfast. 02/19/22 03/21/22  Antonieta Pert, MD  fluticasone (FLONASE) 50 MCG/ACT nasal spray Place 1 spray into both nostrils daily as needed for allergies.    [provider]  folic acid (FOLVITE) 1 MG tablet Take 1 tablet (1 mg total) by mouth daily. 03/24/22 04/23/22  Elgergawy, Silver Huguenin, MD  furosemide (LASIX) 40 MG tablet Take 1 tablet (40 mg total) by mouth 2 (two) times daily. 5 day course. (02/26/22-03/02/22) Patient taking differently: Take 40 mg by mouth 2 (two) times daily. [id number]   Delora Fuel, MD  hydrOXYzine (ATARAX) 25 MG tablet Take 25 mg by mouth 2 (two) times daily as needed for itching.    [provider]  isosorbide mononitrate (IMDUR) 30 MG 24 hr tablet Take 2  tablets (60 mg total) by mouth daily. 03/24/22   Elgergawy, Silver Huguenin, MD  Lido-PE-Glycerin-Petrolatum (PREPARATION H RAPID RELIEF RE) Place 1 spray rectally every 6 (six) hours as needed (hemorrhoids).    [provider]  lidocaine 4 % Place 1 patch onto the skin daily as needed (pain).    [provider]  linaclotide (LINZESS) 290 MCG CAPS capsule Take 290 mcg by mouth daily before breakfast.    [provider]  methocarbamol (ROBAXIN) 500 MG tablet Take 500 mg by mouth every 6 (six) hours as needed for muscle spasms.    [provider]  naloxegol oxalate (MOVANTIK) 12.5 MG TABS tablet Take 1 tablet (12.5 mg total) by mouth daily. 04/08/22   Cirigliano, Vito V, DO  nitroGLYCERIN (NITROSTAT) 0.4 MG SL tablet Place 1 tablet (0.4 mg total) under the tongue every 5 (five) minutes as needed for chest pain. Patient taking differently: Place 0.4 mg under the tongue every 5 (five) minutes x 3 doses as needed for chest pain. 11/13/16 03/18/22  Erlene Quan, PA-C  ondansetron (ZOFRAN) 4 MG tablet Take 4 mg by mouth daily as needed for nausea or vomiting.    [provider]  oxyCODONE (OXY IR/ROXICODONE) 5 MG immediate release tablet Take 1 tablet (5 mg total) by mouth every 6 (six) hours as needed for severe pain. 03/24/22   Elgergawy, Silver Huguenin, MD  OXYGEN Inhale 2-3 L/min into the lungs continuous.    [provider]  oxymetazoline (AFRIN) 0.05 % nasal spray Place 1 spray into both nostrils 2 (two) times daily as needed for congestion.    [provider]  pantoprazole (PROTONIX) 40 MG tablet Take 1 tablet (40 mg total) by mouth 2 (two) times daily. 07/08/19   [name], MD  polyethylene glycol powder (GLYCOLAX/MIRALAX) 17 GM/SCOOP powder Take 17 g by mouth daily as needed (constipation).    [provider]  potassium chloride SA (KLOR-CON M) 20 MEQ tablet Take 1 tablet (20 mEq total) by mouth 2 (two) times daily. [id number]   Delora Fuel, MD  rosuvastatin (CRESTOR) 20 MG tablet Take 1 tablet (20 mg total) by mouth every evening. NEED OV. Patient taking differently: Take 20 mg by mouth every evening. 10/23/21   Croitoru, [name], MD  simethicone (MYLICON) 80 MG chewable tablet Chew 80 mg by mouth 4 (four) times daily as needed for flatulence.    [provider]  sucralfate (CARAFATE) 1 GM/10ML suspension Take 10 mLs (1 g total) by mouth 4 (four) times daily -  with meals and at bedtime. 03/24/22   Elgergawy, Silver Huguenin, MD  vitamin B-12 (CYANOCOBALAMIN) 1000 MCG tablet Take 1 tablet (1,000 mcg total) by mouth daily. 08/15/21   Caren Griffins, MD      Allergies    Ivp dye [iodinated contrast media], Shellfish allergy, Sulfa antibiotics, Iodine, Benadryl [diphenhydramine], Lipitor [atorvastatin], Mitigare [colchicine], Uloric [febuxostat], Vibra-tab [doxycycline], Keflex [cephalexin], and Zithromax [azithromycin]    Review of Systems   Review of Systems  All  other systems reviewed and are negative.  Physical Exam Updated Vital Signs BP (!) 148/78   Pulse 80   Temp (!) 97.5 F (36.4 C) (Oral)   Resp 20   SpO2 100%  Physical Exam Vitals and nursing note reviewed.  Constitutional:      General: She is not in acute distress.    Appearance: Normal appearance. She is well-developed.  HENT:     Head: Normocephalic and atraumatic.  Eyes:     Conjunctiva/sclera: Conjunctivae normal.     Pupils: Pupils are equal, round, and reactive to light.  Cardiovascular:     Rate and Rhythm: Normal rate and regular rhythm.     Heart sounds: Normal heart sounds.  Pulmonary:     Effort: Pulmonary effort is normal. No respiratory distress.     Breath sounds: Normal breath sounds.  Abdominal:     General: There is no distension.     Palpations: Abdomen is soft.     Tenderness: There is no abdominal tenderness.  Musculoskeletal:        General: No deformity. Normal range of motion.     Cervical back: Normal range of motion  and neck supple.  Skin:    General: Skin is warm and dry.  Neurological:     Mental Status: She is alert.     Comments: Alert, oriented to person and place and situation  No facial droop  Patient with intermittent word finding difficulty.  She will attempt to answer questions appropriately but then insert the incorrect word.  No focal weakness in both upper or lower extremities appreciated.   ED Results / Procedures / Treatments   Labs (all labs ordered are listed, but only abnormal results are displayed) Labs Reviewed  CBC WITH DIFFERENTIAL/PLATELET - Abnormal; Notable for the following components:      Result Value   Hemoglobin 11.3 (*)    RDW 17.2 (*)    Platelets 124 (*)    All other components within normal limits  COMPREHENSIVE METABOLIC PANEL - Abnormal; Notable for the following components:   CO2 18 (*)    Glucose, Bld 154 (*)    BUN 28 (*)    Creatinine, Ser 2.05 (*)    Total Protein 5.7 (*)    Albumin 2.4 (*)    AST 56 (*)    Alkaline Phosphatase 356 (*)    GFR, Estimated 24 (*)    All other components within normal limits  LIPASE, BLOOD - Abnormal; Notable for the following components:   Lipase 198 (*)    All other components within normal limits  CBG MONITORING, ED - Abnormal; Notable for the following components:   Glucose-Capillary 137 (*)    All other components within normal limits  TROPONIN I (HIGH SENSITIVITY) - Abnormal; Notable for the following components:   Troponin I (High Sensitivity) 158 (*)    All other components within normal limits  RESP PANEL BY RT-PCR (RSV, FLU A&B, COVID)  [id number]  PROTIME-INR  LACTIC ACID, PLASMA  ETHANOL  AMMONIA  URINALYSIS, ROUTINE W REFLEX MICROSCOPIC  RAPID URINE DRUG SCREEN, HOSP PERFORMED  TROPONIN I (HIGH SENSITIVITY)    EKG EKG Interpretation  Date/Time:  Monday April 21 2022 12:07:14 EDT Ventricular Rate:  87 PR Interval:    QRS Duration: 147 QT Interval:  411 QTC Calculation: 495 R  Axis:   17 Text Interpretation: Atrial-sensed ventricular-paced rhythm Right bundle branch block Confirmed by Dene Gentry [phone]) on 04/21/2022 12:26:18 PM  Radiology DG Chest  Port 1 View  Result Date: 04/21/2022 CLINICAL DATA:  Weakness EXAM: PORTABLE CHEST 1 VIEW COMPARISON:  CXR 03/04/22 FINDINGS: Status post median sternotomy and CABG. Right-sided cardiac device in place with unchanged lead positioning. Thoracic spinal fusion hardware in place. Low lung volumes. Unchanged left-sided pleural effusion. No right-sided pleural effusion. No new focal airspace opacity. No pneumothorax. Unchanged cardiac and mediastinal contours. Visualized upper abdomen is unremarkable. IMPRESSION: No acute abnormality. Electronically Signed   By: Marin Roberts M.D.   On: 04/21/2022 13:45   CT Head Wo Contrast  Result Date: 04/21/2022 CLINICAL DATA:  Altered mental status EXAM: CT HEAD WITHOUT CONTRAST TECHNIQUE: Contiguous axial images were obtained from the base of the skull through the vertex without intravenous contrast. RADIATION DOSE REDUCTION: This exam was performed according to the departmental dose-optimization program which includes automated exposure control, adjustment of the mA and/or kV according to patient size and/or use of iterative reconstruction technique. COMPARISON:  CT Head 01/26/22 FINDINGS: Brain: No evidence of acute infarction, hemorrhage, hydrocephalus, extra-axial collection or mass lesion/mass effect. Sequela of severe chronic microvascular ischemic change. Vascular: No hyperdense vessel or unexpected calcification. Skull: Normal. Negative for fracture or focal lesion. Sinuses/Orbits: No middle ear or mastoid effusion. Paranasal sinuses are clear. Bilateral lens replacement. Orbits are otherwise unremarkable. Other: None. IMPRESSION: No acute intracranial abnormality. Electronically Signed   By: Marin Roberts M.D.   On: 04/21/2022 13:13    Procedures Procedures    Medications Ordered in  ED Medications - No data to display  ED Course/ Medical Decision Making/ A&P                             Medical Decision Making Amount and/or Complexity of Data Reviewed Labs: ordered. Radiology: ordered.    Medical Screen Complete  This patient presented to the ED with complaint of ams, aphasia.  This complaint involves an extensive number of treatment options. The initial differential diagnosis includes, but is not limited to, CVA, metabolic abnormality, infection, etc.  This presentation is: Acute, Chronic, Self-Limited, Previously Undiagnosed, Uncertain Prognosis, Complicated, Systemic Symptoms, and Threat to Life/Bodily Function  Patient is presenting with complaint of word finding aphasia.  Symptoms appear to have started yesterday.  Last known well is unclear.  CT head imaging is without acute pathology.  MRI brain will be complicated by patient's AICD.  MRI staff report that patient will likely be imaged tomorrow.  Neuro is aware of case and will evaluate in consultation.  Patient with notable labs including lipase of 198, creatinine of 2.05, hemoglobin of 11.3, troponin of 164.  Hospitalist service is aware of case and will evaluate for admission.  Additional history obtained:  Additional history obtained from EMS External records from outside sources obtained and reviewed including prior ED visits and prior Inpatient records.    Lab Tests:  I ordered and personally interpreted labs.  The pertinent results include: CBC, CMP, INR, COVID, flu, UA, ammonia, lactic acid   Imaging Studies ordered:  I ordered imaging studies including CT head, plain films of chest, MRI brain is pending I independently visualized and interpreted obtained imaging which showed no acute pathology on head CT or chest x-ray I agree with the radiologist interpretation.   Cardiac Monitoring:  The patient was maintained on a cardiac monitor.  I personally viewed and interpreted the  cardiac monitor which showed an underlying rhythm of: NSR   Problem List / ED Course:  AMS, aphasia  Reevaluation:  After the interventions noted above, I reevaluated the patient and found that they have: stayed the same  Disposition:  After consideration of the diagnostic results and the patients response to treatment, I feel that the patent would benefit from admission.   CRITICAL CARE Performed by: Valarie Merino   Total critical care time: 30 minutes  Critical care time was exclusive of separately billable procedures and treating other patients.  Critical care was necessary to treat or prevent imminent or life-threatening deterioration.  Critical care was time spent personally by me on the following activities: development of treatment plan with patient and/or surrogate as well as nursing, discussions with consultants, evaluation of patient's response to treatment, examination of patient, obtaining history from patient or surrogate, ordering and performing treatments and interventions, ordering and review of laboratory studies, ordering and review of radiographic studies, pulse oximetry and re-evaluation of patient's condition.         Final Clinical Impression(s) / ED Diagnoses Final diagnoses:  Altered mental status, unspecified altered mental status type    Rx / DC Orders ED Discharge Orders     None         Valarie Merino, MD 04/21/22 1524

## 2022-04-22 ENCOUNTER — Observation Stay (HOSPITAL_COMMUNITY): Payer: 59

## 2022-04-22 ENCOUNTER — Other Ambulatory Visit (HOSPITAL_COMMUNITY): Payer: Self-pay | Admitting: *Deleted

## 2022-04-22 ENCOUNTER — Observation Stay (HOSPITAL_BASED_OUTPATIENT_CLINIC_OR_DEPARTMENT_OTHER): Payer: 59

## 2022-04-22 DIAGNOSIS — I639 Cerebral infarction, unspecified: Secondary | ICD-10-CM | POA: Diagnosis not present

## 2022-04-22 DIAGNOSIS — I6523 Occlusion and stenosis of bilateral carotid arteries: Secondary | ICD-10-CM | POA: Diagnosis not present

## 2022-04-22 DIAGNOSIS — R4701 Aphasia: Secondary | ICD-10-CM | POA: Diagnosis not present

## 2022-04-22 LAB — COMPREHENSIVE METABOLIC PANEL
ALT: 31 U/L (ref 0–44)
AST: 32 U/L (ref 15–41)
Albumin: 2 g/dL — ABNORMAL LOW (ref 3.5–5.0)
Alkaline Phosphatase: 288 U/L — ABNORMAL HIGH (ref 38–126)
Anion gap: 5 (ref 5–15)
BUN: 27 mg/dL — ABNORMAL HIGH (ref 8–23)
CO2: 19 mmol/L — ABNORMAL LOW (ref 22–32)
Calcium: 9.3 mg/dL (ref 8.9–10.3)
Chloride: 111 mmol/L (ref 98–111)
Creatinine, Ser: 1.97 mg/dL — ABNORMAL HIGH (ref 0.44–1.00)
GFR, Estimated: 26 mL/min — ABNORMAL LOW (ref >60)
Glucose, Bld: 105 mg/dL — ABNORMAL HIGH (ref 70–99)
Potassium: 4.8 mmol/L (ref 3.5–5.1)
Sodium: 135 mmol/L (ref 135–145)
Total Bilirubin: 0.2 mg/dL — ABNORMAL LOW (ref 0.3–1.2)
Total Protein: 4.9 g/dL — ABNORMAL LOW (ref 6.5–8.1)

## 2022-04-22 LAB — CBC
HCT: 30.5 % — ABNORMAL LOW (ref 36.0–46.0)
Hemoglobin: 9.6 g/dL — ABNORMAL LOW (ref 12.0–15.0)
MCH: 28.6 pg (ref 26.0–34.0)
MCHC: 31.5 g/dL (ref 30.0–36.0)
MCV: 90.8 fL (ref 80.0–100.0)
Platelets: 98 10*3/uL — ABNORMAL LOW (ref 150–400)
RBC: 3.36 MIL/uL — ABNORMAL LOW (ref 3.87–5.11)
RDW: 17.2 % — ABNORMAL HIGH (ref 11.5–15.5)
WBC: 6.1 10*3/uL (ref 4.0–10.5)
nRBC: 0 % (ref 0.0–0.2)

## 2022-04-22 LAB — LIPID PANEL
Cholesterol: 154 mg/dL (ref 0–200)
HDL: 48 mg/dL (ref >40)
LDL Cholesterol: 82 mg/dL (ref 0–99)
Total CHOL/HDL Ratio: 3.2 RATIO
Triglycerides: 121 mg/dL (ref <150)
VLDL: 24 mg/dL (ref 0–40)

## 2022-04-22 MED ORDER — ASPIRIN 81 MG PO CHEW
81.0000 mg | CHEWABLE_TABLET | Freq: Every day | ORAL | Status: DC
  Administered 2022-04-22 – 2022-04-23 (×2): 81 mg via ORAL
  Filled 2022-04-22 (×2): qty 1

## 2022-04-22 MED ORDER — MELATONIN 3 MG PO TABS
3.0000 mg | ORAL_TABLET | Freq: Every evening | ORAL | Status: DC | PRN
  Administered 2022-04-22 – 2022-04-24 (×2): 3 mg via ORAL
  Filled 2022-04-22 (×2): qty 1

## 2022-04-22 MED ORDER — HEPARIN SODIUM (PORCINE) 5000 UNIT/ML IJ SOLN: 5000.0000 [IU] | Freq: Three times a day (TID) | INTRAMUSCULAR | Status: DC

## 2022-04-22 MED ORDER — ROSUVASTATIN CALCIUM 20 MG PO TABS
40.0000 mg | ORAL_TABLET | Freq: Every evening | ORAL | Status: DC
  Administered 2022-04-22 – 2022-04-24 (×3): 40 mg via ORAL
  Filled 2022-04-22 (×3): qty 2

## 2022-04-22 MED ORDER — TICAGRELOR 90 MG PO TABS
90.0000 mg | ORAL_TABLET | Freq: Two times a day (BID) | ORAL | Status: DC
  Administered 2022-04-22 – 2022-04-25 (×7): 90 mg via ORAL
  Filled 2022-04-22 (×7): qty 1

## 2022-04-22 NOTE — Progress Notes (Addendum)
6 6 STROKE TEAM PROGRESS NOTE   INTERVAL HISTORY Patient is seen in her room with no family at the bedside.  Yesterday, after breakfast, she noticed that she could not speak although she could understand when others were speaking and knew that she was having trouble speaking she felt worsening confusion and was brought to the emergency department.  The confusion has resolved, but patient still has some word finding difficulties and deficits and repetition.   Patient is unable to have an MRI due to incompatible pacemaker Vitals:   04/21/22 1945 04/21/22 2313 04/22/22 0401 04/22/22 0834  BP: (!) 178/83 (!) 165/73 (!) 170/87 (!) 184/75  Pulse: 83 85 82 79  Resp: '20 17 18 18  '$ Temp: 98.4 F (36.9 C) 98.3 F (36.8 C) (!) 97.4 F (36.3 C) 98 F (36.7 C)  TempSrc: Oral Oral Oral Oral  SpO2: 100% 98% 97% 98%  Weight:      Height:       CBC:  Recent Labs  Lab 04/21/22 1214 04/22/22 0251  WBC 6.9 6.1  NEUTROABS 5.2  --   HGB 11.3* 9.6*  HCT 37.5 30.5*  MCV 92.8 90.8  PLT 124* 98*   Basic Metabolic Panel:  Recent Labs  Lab 04/21/22 1214 04/22/22 0251  NA 135 135  K 5.0 4.8  CL 110 111  CO2 18* 19*  GLUCOSE 154* 105*  BUN 28* 27*  CREATININE 2.05* 1.97*  CALCIUM 10.1 9.3   Lipid Panel:  Recent Labs  Lab 04/22/22 0251  CHOL 154  TRIG 121  HDL 48  CHOLHDL 3.2  VLDL 24  LDLCALC 82   HgbA1c: No results for input(s): "HGBA1C" in the last 168 hours. Urine Drug Screen: No results for input(s): "LABOPIA", "COCAINSCRNUR", "LABBENZ", "AMPHETMU", "THCU", "LABBARB" in the last 168 hours.  Alcohol Level  Recent Labs  Lab 04/21/22 1213  ETH <10    IMAGING past 24 hours DG Chest Port 1 View  Result Date: 04/21/2022 CLINICAL DATA:  Weakness EXAM: PORTABLE CHEST 1 VIEW COMPARISON:  CXR 03/04/22 FINDINGS: Status post median sternotomy and CABG. Right-sided cardiac device in place with unchanged lead positioning. Thoracic spinal fusion hardware in place. Low lung volumes.  Unchanged left-sided pleural effusion. No right-sided pleural effusion. No new focal airspace opacity. No pneumothorax. Unchanged cardiac and mediastinal contours. Visualized upper abdomen is unremarkable. IMPRESSION: No acute abnormality. Electronically Signed   By: Marin Roberts M.D.   On: 04/21/2022 13:45   CT Head Wo Contrast  Result Date: 04/21/2022 CLINICAL DATA:  Altered mental status EXAM: CT HEAD WITHOUT CONTRAST TECHNIQUE: Contiguous axial images were obtained from the base of the skull through the vertex without intravenous contrast. RADIATION DOSE REDUCTION: This exam was performed according to the departmental dose-optimization program which includes automated exposure control, adjustment of the mA and/or kV according to patient size and/or use of iterative reconstruction technique. COMPARISON:  CT Head 01/26/22 FINDINGS: Brain: No evidence of acute infarction, hemorrhage, hydrocephalus, extra-axial collection or mass lesion/mass effect. Sequela of severe chronic microvascular ischemic change. Vascular: No hyperdense vessel or unexpected calcification. Skull: Normal. Negative for fracture or focal lesion. Sinuses/Orbits: No middle ear or mastoid effusion. Paranasal sinuses are clear. Bilateral lens replacement. Orbits are otherwise unremarkable. Other: None. IMPRESSION: No acute intracranial abnormality. Electronically Signed   By: Marin Roberts M.D.   On: 04/21/2022 13:13    PHYSICAL EXAM General: Alert, well-nourished, well-developed patient in no acute distress Respiratory: Regular, unlabored respirations on room air  NEURO:  Mental  Status: AA&Ox3  Speech/Language: speech is with some word finding difficulties and impaired repetition.  Able to name only 3 animals with 4 feet when asked  Cranial Nerves:  II: PERRL. Visual fields full.  III, IV, VI: EOMI. Eyelids elevate symmetrically.  V: Sensation is intact to light touch and symmetrical to face.  VII: Smile is symmetrical.  VIII:  hearing intact to voice. IX, X: Phonation is normal.  XII: tongue is midline without fasciculations. Motor: 5/5 strength to all muscle groups tested.  Tone: is normal and bulk is normal Sensation- Intact to light touch bilaterally.  Coordination: FTN intact bilaterally.No drift.  Gait- deferred   ASSESSMENT/PLAN Ms. ELGIN LAINES is a 77 y.o. female with history of nonischemic cardiomyopathy, AICD incompatible with MRI, anemia, asthma, arthritis, A-fib, CAD, fatty liver disease, GERD, hyperlipidemia, hypertension, stroke and diabetes who presented yesterday after breakfast when she noticed that she could not speak although she could understand when others were speaking.  She knew that she was having trouble speaking she felt worsening confusion and was brought to the emergency department.  The confusion has resolved, but patient still has some word finding difficulties and deficits and repetition.  She came from a rehab facility and verbalized concerns that she was not getting all of her medications there, to include her anticoagulation for A-fib.  As patient is unable to obtain MRI due to incompatible pacemaker, will repeat head CT 24 hours after first study to assess for presence of stroke.  Stroke: Likely small left-sided stroke Etiology: Likely embolic in the setting of ? atrial fibrillation off anticoagulation CT head No acute abnormality. Small vessel disease.  Follow-up CT 3/12 pending MRI unable to obtain due to pacemaker Carotid Doppler pending Transcranial Doppler pending LDL 82 HgbA1c 5.6 VTE prophylaxis -SQH    Diet   Diet heart healthy/carb modified Room service appropriate? Yes; Fluid consistency: Thin; Fluid restriction: 1500 mL Fluid   clopidogrel 75 mg daily prior to admission, now on aspirin 81 mg daily and brilinta  x 4 weeks and then aspirin daily. Therapy recommendations: SNF Disposition: Pending  Hypertension Home meds: None Stable Permissive hypertension  (OK if < 220/120) but gradually normalize in 5-7 days Long-term BP goal normotensive  Hyperlipidemia Home meds: Rosuvastatin 20 mg daily increased to 40 mg LDL 82, goal < 70 Continue statin at discharge  Atrial fibrillation Episodes of A-fib seen on patient's pacemaker checks in the past Pacemaker interrogated, demonstrating no recent episodes of atrial fibrillation No need for anticoagulation at this time  Other Stroke Risk Factors Advanced Age >/= 32  Former cigarette smoker Hx stroke Coronary artery disease  Other Active Problems None  Hospital day # 0  [place] , MSN, AGACNP-BC [facility] See Amion for schedule and pager information 04/22/2022 10:32 AM  I have personally obtained history,examined this patient, reviewed notes, independently viewed imaging studies, participated in medical decision making and plan of care.ROS completed by me personally and pertinent positives fully documented  I have made any additions or clarifications directly to the above note. Agree with note above.  Patient presented with transient episode of expressive language difficulties and confusion which appears to have resolved.  CT head is unremarkable but MRI cannot be obtained due to incompatible pacemaker.  Recommend repeat CT head.  Unable to get CT angio due to renal insufficiency.  Check carotid ultrasound intravascular Doppler studies.  Ask EP team to evaluate pacemaker rhythm to confirm or rule out A-fib.  If  A-fib is confirmed may need to consider switching to Eliquis otherwise dual antiplatelet therapy aspirin and Brilinta for 4 weeks followed by aspirin alone.  Long discussion with patient and answered questions.  Discussed with Dr. Posey Pronto.Greater than 50% time during this 50-minute visit was spent counseling and coordination of care about TIA versus small stroke not visualized on CT scan answering questions  Antony Contras, MD Medical Director [place] Pager: [phone] 04/22/2022 2:01 PM   To contact Stroke Continuity provider, please refer to [email or web address] After hours, contact General Neurology

## 2022-04-22 NOTE — Progress Notes (Signed)
  ICD checked at request of neurology.   No true atrial fib. Burden overall <1%. Recent recorded episodes false due to R wave oversensing.   Legrand Como [address] [place], PA-C  04/22/2022 11:18 AM

## 2022-04-22 NOTE — Progress Notes (Signed)
Patient has a MRI unsafe pacemaker. Unable to perform MRI. [place] NP informed of this and advised to cancel MRI.

## 2022-04-22 NOTE — Evaluation (Addendum)
Physical Therapy Evaluation Patient Details Name: [name] MRN: [id number] DOB: 12-31-1945 Today's Date: 04/22/2022  History of Present Illness  Pt is a 77 y/o female presenting on 3/11 with confusion and aphasia from [place]. CT negative, MRI pending pacemaker clearance. Per neurology- high suspicion for stroke- likely L MCA. PMH includes: AICD/PPM, anemia, arthritis, Afib, CAD, chronic lower back pain, cout, HTN, IBS, obesity, OSA, CVA, DM2.  Clinical Impression  Pt admitted from SNF. Presents with generalized weakness (LLE weaker than RLE due to prior surgery) and decreased activity tolerance. Pt deferring out of bed mobility due to headache and difficulty breathing. SpO2 98% on RA, HR 70. DOE 0/4. Pt requiring min guard-modA for bed mobility with increased difficulty rolling to R in comparison to L. Agreeable to participate in bed level exercises. Will continue to follow acutely to progress as tolerated. Recommend return to SNF at d/c.     Recommendations for follow up therapy are one component of a multi-disciplinary discharge planning process, led by the attending physician.  Recommendations may be updated based on patient status, additional functional criteria and insurance authorization.  Follow Up Recommendations Skilled nursing-short term rehab (<3 hours/day) Can patient physically be transported by private vehicle: No    Assistance Recommended at Discharge Intermittent Supervision/Assistance  Patient can return home with the following  A little help with walking and/or transfers;A little help with bathing/dressing/bathroom    Equipment Recommendations None recommended by PT  Recommendations for Other Services       Functional Status Assessment Patient has had a recent decline in their functional status and demonstrates the ability to make significant improvements in function in a reasonable and predictable amount of time.     Precautions / Restrictions  Precautions Precautions: Fall Restrictions Weight Bearing Restrictions: Yes LLE Weight Bearing: Partial weight bearing LLE Partial Weight Bearing Percentage or Pounds: 50% Other Position/Activity Restrictions: No active L hip abduction      Mobility  Bed Mobility Overal bed mobility: Needs Assistance Bed Mobility: Rolling Rolling: Min guard, Mod assist         General bed mobility comments: Rolling to L with min guard, rolling to R with modA. Good initiation and use of bed rail    Transfers                   General transfer comment: deferred by pt    Ambulation/Gait                  Stairs            Wheelchair Mobility    Modified Rankin (Stroke Patients Only)       Balance                                             Pertinent Vitals/Pain Pain Assessment Pain Assessment: Faces Faces Pain Scale: Hurts a little bit Pain Location: head Pain Descriptors / Indicators: Headache Pain Intervention(s): Monitored during session    Home Living Family/patient expects to be discharged to:: Skilled nursing facility                        Prior Function Prior Level of Function : Needs assist             Mobility Comments: Working with PT at rehab ADLs Comments: Has assist with bathing/dressing  Hand Dominance   Dominant Hand: Right    Extremity/Trunk Assessment   Upper Extremity Assessment Upper Extremity Assessment: Defer to OT evaluation    Lower Extremity Assessment Lower Extremity Assessment: RLE deficits/detail;LLE deficits/detail RLE Deficits / Details: 3-/5 proximally, 4/5 distally LLE Deficits / Details: Grossly 2/5, prior hip sx       Communication   Communication: No difficulties  Cognition Arousal/Alertness: Awake/alert Behavior During Therapy: WFL for tasks assessed/performed Overall Cognitive Status: Within Functional Limits for tasks assessed                                  General Comments: pt self limiting        General Comments      Exercises General Exercises - Upper Extremity Shoulder Flexion: AAROM, Left, 5 reps, Supine General Exercises - Lower Extremity Ankle Circles/Pumps: AROM, Both, 5 reps, Supine Quad Sets: AROM, Both, 5 reps, Supine Heel Slides: AROM, Both, 5 reps, Supine   Assessment/Plan    PT Assessment Patient needs continued PT services  PT Problem List Decreased strength;Decreased activity tolerance;Decreased balance;Decreased mobility       PT Treatment Interventions Functional mobility training;Therapeutic activities;Therapeutic exercise;Balance training;Patient/family education;DME instruction    PT Goals (Current goals can be found in the Care Plan section)  Acute Rehab PT Goals Patient Stated Goal: to feel better PT Goal Formulation: With patient Time For Goal Achievement: 05/06/22 Potential to Achieve Goals: Good    Frequency Min 2X/week     Co-evaluation               AM-PAC PT "6 Clicks" Mobility  Outcome Measure Help needed turning from your back to your side while in a flat bed without using bedrails?: A Little Help needed moving from lying on your back to sitting on the side of a flat bed without using bedrails?: A Little Help needed moving to and from a bed to a chair (including a wheelchair)?: A Little Help needed standing up from a chair using your arms (e.g., wheelchair or bedside chair)?: A Little Help needed to walk in hospital room?: A Lot Help needed climbing 3-5 steps with a railing? : Total 6 Click Score: 15    End of Session   Activity Tolerance: Other (comment) (self limiting) Patient left: in bed;with call bell/phone within reach;with bed alarm set Nurse Communication: Mobility status PT Visit Diagnosis: Difficulty in walking, not elsewhere classified (R26.2);Muscle weakness (generalized) (M62.81)    Time: [id number] PT Time Calculation (min) (ACUTE ONLY): 25  min   Charges:   PT Evaluation $PT Eval Low Complexity: 1 Low PT Treatments $Therapeutic Activity: 8-22 mins        Wyona Almas, PT, DPT Acute Rehabilitation Services Office [phone]   Deno Etienne 04/22/2022, 9:22 AM

## 2022-04-22 NOTE — Evaluation (Signed)
Speech Language Pathology Evaluation Patient Details Name: [name] MRN: [id number] DOB: March 27, 1945 Today's Date: 04/22/2022 Time: 1030-1045 SLP Time Calculation (min) (ACUTE ONLY): 15 min  Problem List:  Patient Active Problem List   Diagnosis Date Noted   Expressive aphasia 0000000   Acute metabolic encephalopathy 0000000   Asthma without acute exacerbation 04/21/2022   Heart failure with reduced ejection fraction ([place]) 04/21/2022   Candida esophagitis ([place]) 03/20/2022   Gastritis and gastroduodenitis 03/20/2022   Nausea without vomiting 03/20/2022   Heme positive stool 03/19/2022   Acute on chronic anemia 03/19/2022   Abdominal pain, chronic, epigastric 03/19/2022   Acute pancreatitis 03/17/2022   Dyslipidemia 03/17/2022   GERD without esophagitis 03/17/2022   Coronary artery disease 03/17/2022   Pain from implanted hardware 02/06/2022   Diabetic ulcer of toe ([place]) 11/18/2021   Pain due to onychomycosis of toenails of both feet 11/18/2021   Pressure injury of back, stage 2 ([place]) 11/03/2021   Acute on chronic respiratory failure with hypoxia ([place]) 11/02/2021   History of fracture of left hip 09/10/2021   Iatrogenic adrenal insufficiency ([place]) 09/10/2021   T9 vertebral fracture ([place]) 09/09/2021   Acute kidney injury superimposed on chronic kidney disease ([place]) 09/09/2021   Chronic hypoxemic respiratory failure ([place]) 09/09/2021   Acute combined systolic and diastolic heart failure (HCC)    Acute CHF (congestive heart failure) ([place]) 08/04/2021   Callus 07/19/2021   Hypervolemia associated with renal insufficiency 06/11/2021   Physical deconditioning 06/11/2021   Subcutaneous fat necrosis    Pressure injury of skin 04/13/2021   Closed left hip fracture ([place]) secondary to fall at home 04/03/2021   Closed fracture of left superior pubic ramus ([place]) 04/03/2021   Chronic kidney disease, stage III B (moderate) ([place]) 04/03/2021   Fall at home, initial encounter  04/03/2021   Hypokalemia 04/03/2021   Obesity hypoventilation syndrome ([place]) 04/02/2021   Primary osteoarthritis, left ankle and foot 02/05/2021   Allergy to shellfish 01/15/2021   Decreased estrogen level 01/15/2021   Diabetic renal disease ([place]) 01/15/2021   Gallstones 01/15/2021   Idiopathic gout 01/15/2021   Mild intermittent asthma 01/15/2021   Pure hypercholesterolemia 01/15/2021   CHF (congestive heart failure) ([place]) 01/07/2021   Chronic diastolic heart failure ([place]) 12/31/2020   Nasal congestion with rhinorrhea 10/24/2020   Carotid stenosis 10/01/2020   Ischemic cardiomyopathy 09/27/2020   Hypertension 09/19/2020   Acute left-sided weakness 09/16/2020   Blood blister 08/01/2020   Elevated troponin 06/01/2019   Irritable bowel syndrome with constipation 10/21/2018   Chronic pain 07/30/2018   Congestive heart failure (CHF) ([place]) 07/30/2018   Supplemental oxygen dependent 07/30/2018   Leukopenia 05/03/2018   Thrombocytopenia ([place]) 05/03/2018   Pneumonia due to human metapneumovirus 05/02/2018   Chest pain of uncertain etiology [id number]   Allergic rhinitis 04/13/2018   S/P laparoscopic cholecystectomy 04/09/2018   Respiratory failure ([place]) 03/07/2018   Polyp of cecum    Polyp of transverse colon    Positive colorectal cancer screening using Cologuard test    Dysphagia    Acute on chronic renal failure ([place]) 11/20/2017   Coronary artery disease of native artery of native heart with stable angina pectoris ([place]) 11/20/2017   Diabetes mellitus without complication ([place]) [id number]   Abdominal pain, epigastric 11/20/2017   Vasomotor rhinitis 10/16/2016   Right facial numbness 03/05/2016   ESRD (end stage renal disease) ([place] 03/05/2016   Atrial fibrillation ([place]) 10/24/2015   Chest pain with moderate risk of acute coronary syndrome 10/23/2015  History of TIA (transient ischemic attack) 10/22/2015   CHB (complete heart block) (HCC) 06/22/2015   Allergic drug rash due to  anti-infective agent 04/29/2015   Fatigue 02/14/2015   Chronic diastolic CHF (congestive heart failure) ([place]) 02/14/2015   Cellulitis 12/21/2014   S/P CABG x 5 11/29/2014   CAD S/P percutaneous coronary angioplasty    Diarrhea 07/12/2014   Abdominal pain 07/12/2014   Acute on chronic diastolic heart failure ([place]) 11/04/2013   Mixed hypercholesterolemia and hypertriglyceridemia 04/22/2013   Vertigo 04/22/2013   Dyspnea on exertion 08/25/2012   Allergic to IV contrast 07/23/2012   Unstable angina ([place]) 07/22/2012   Obesity 11/22/2011   Cardiomyopathy ([place]) 11/22/2011   Gastroesophageal reflux disease 11/22/2011   Back pain 11/22/2011   OSA (obstructive sleep apnea) 11/22/2011   HEMORRHOIDS-EXTERNAL 02/21/2010   NAUSEA 02/21/2010   ABDOMINAL PAIN -GENERALIZED 02/21/2010   PERSONAL HX COLONIC POLYPS 02/21/2010   ANEMIA 04/16/2007   HTN (hypertension), benign 04/16/2007   DIVERTICULOSIS, COLON 04/16/2007   ARTHRITIS 04/16/2007   INTERNAL HEMORRHOIDS WITHOUT MENTION COMP 04/12/2007   Sleep apnea 04/12/2007   Cardiac resynchronization therapy pacemaker (CRT-P) in place 04/12/2007   Gastritis without bleeding 12/14/2006   COLONIC POLYPS, HYPERPLASTIC 09/27/2002   FATTY LIVER DISEASE 02/16/2002   Past Medical History:  Past Medical History:  Diagnosis Date   AICD (automatic cardioverter/defibrillator) present    downgraded to CRT-P in 2014   Anemia    Arthritis    Asthma    Atrial fibrillation ([place]) 10/24/2015   Questionable history of A Fib/A Flutter. On device interrogation on 9/7, showed 0.100mn of A Fib/A Flutter with 1% burden.  Device check in Jan 2018 showed no sustained AF (runs of less than 30 seconds). No anticoagulation indicated.   CAD (coronary artery disease)    a. 10/09/16 LHC: SVG->LAD patent, SVG->Diag patent, SVG->RCA patent, SVG->LCx occluded. EF 60%, b. 10/31/16 LHC DES to AV groove Circ, DES to intermed branch   Chronic lower back pain    Fatty liver disease,  nonalcoholic    GERD (gastroesophageal reflux disease)    Gout    H/O hiatal hernia    High cholesterol    Hyperplastic colon polyp    Hypertension    IBS (irritable bowel syndrome)    Internal hemorrhoids    INTERNAL HEMORRHOIDS WITHOUT MENTION COMP 04/12/2007   Qualifier: Diagnosis of  By: BOlevia PerchesMD, Dora M    Nonischemic cardiomyopathy ([place] 11/22/2011   Pt responded to BiV ICD- last EF 55-60% Sept 2017   Obesity    OSA (obstructive sleep apnea)    "can't tolerate a mask", wears 2L nocturnal O2   Presence of permanent cardiac pacemaker    11/02/12 [employer] V273 INTUA PPM   Stroke ([facility] 09/2020   Type II diabetes mellitus ([place]    Past Surgical History:  Past Surgical History:  Procedure Laterality Date   ABDOMINAL ULTRASOUND  12/01/2011   Peripelvic cysts- #1- 2.4x1.9x2.3cm, #2-1.2x0.9x1.2cm   ANKLE FRACTURE SURGERY Right    "had rod put in"   ANTERIOR CERVICAL DECOMP/DISCECTOMY FUSION     APPENDECTOMY     BACK SURGERY     BIOPSY  12/29/2017   Procedure: BIOPSY;  Surgeon: NMauri Pole MD;  [facility];  Service: Endoscopy;;   BIOPSY  03/20/2022   Procedure: BIOPSY;  Surgeon: CLavena Bullion DO;  [facility];  Service: Gastroenterology;;   BIV ICD INSERTION CRT-D  2001?   BIV PACEMAKER GENERATOR CHANGE OUT N/A  11/02/2012   Procedure: BIV PACEMAKER GENERATOR CHANGE OUT;  Surgeon: Sanda Klein, MD;  Location: [place] C[facility] LAB;  Service: Cardiovascular;  Laterality: N/A;   CARDIAC CATHETERIZATION  05/17/1999   No significant coronary obstructive disease w/ mild 20% luminal irregularity of the first diag branch of the LAD   CARDIAC CATHETERIZATION  07/08/2002   No significant CAD, moderately depressed LV systolic function   CARDIAC CATHETERIZATION Bilateral 04/26/2007   Normal findings, recommend medical therapy   CARDIAC CATHETERIZATION  02/18/2008   Moderate CAD, would benefit from having a functional study, recommend continue medical therapy    CARDIAC CATHETERIZATION  07/23/2012   Medical therapy   CARDIAC CATHETERIZATION N/A 11/24/2014   Procedure: Left Heart Cath and Coronary Angiography;  Surgeon: Troy Sine, MD;  Location: [place] CV LAB;  Service: Cardiovascular;  Laterality: N/A;   CARDIAC CATHETERIZATION  11/27/2014   Procedure: Intravascular Pressure Wire/FFR Study;  Surgeon: Peter M Martinique, MD;  Location: [place] [facility] LAB;  Service: Cardiovascular;;   CARDIAC CATHETERIZATION  10/09/2016   CHOLECYSTECTOMY N/A 04/09/2018   Procedure: LAPAROSCOPIC CHOLECYSTECTOMY;  Surgeon: Greer Pickerel, MD;  [facility];  Service: General;  Laterality: N/A;   COLONOSCOPY WITH PROPOFOL N/A 12/29/2017   Procedure: COLONOSCOPY WITH PROPOFOL;  Surgeon: [name], MD;  [facility];  Service: Endoscopy;  Laterality: N/A;   CONVERSION TO TOTAL HIP Left 02/12/2022   Procedure: NAIL REMOVAL WITH CONVERSION TO TOTAL HIP;  Surgeon: Willaim Sheng, MD;  Location: [place];  Service: Orthopedics;  Laterality: Left;   CORONARY ANGIOGRAM  2010   CORONARY ARTERY BYPASS GRAFT N/A 11/29/2014   Procedure: CORONARY ARTERY BYPASS GRAFTING x 5 (LIMA-LAD, SVG-D, SVG-OM1-OM2, SVG-PD);  Surgeon: Melrose Nakayama, MD;  Location: [place];  Service: Open Heart Surgery;  Laterality: N/A;   CORONARY STENT INTERVENTION N/A 10/31/2016   Procedure: CORONARY STENT INTERVENTION;  Surgeon: Burnell Blanks, MD;  Location: [place] CV LAB;  Service: Cardiovascular;  Laterality: N/A;   ESOPHAGOGASTRODUODENOSCOPY (EGD) WITH PROPOFOL N/A 12/29/2017   Procedure: ESOPHAGOGASTRODUODENOSCOPY (EGD) WITH PROPOFOL;  Surgeon: [name], MD;  [facility];  Service: Endoscopy;  Laterality: N/A;   ESOPHAGOGASTRODUODENOSCOPY (EGD) WITH PROPOFOL N/A 03/20/2022   Procedure: ESOPHAGOGASTRODUODENOSCOPY (EGD) WITH PROPOFOL;  Surgeon: Lavena Bullion, DO;  Location: [place];  Service: Gastroenterology;  Laterality: N/A;    FRACTURE SURGERY     INSERT / REPLACE / REMOVE PACEMAKER  1999   INTRAMEDULLARY (IM) NAIL INTERTROCHANTERIC Left 04/04/2021   Procedure: INTRAMEDULLARY (IM) NAIL INTERTROCHANTRIC;  Surgeon: Vanetta Mulders, MD;  Location: [place];  Service: Orthopedics;  Laterality: Left;   IR FLUORO GUIDE CV LINE LEFT  04/11/2021   IR US GUIDE VASC ACCESS LEFT  04/11/2021   KNEE ARTHROSCOPY Bilateral    LEFT HEART CATH AND CORS/GRAFTS ANGIOGRAPHY N/A 12/09/2017   Procedure: LEFT HEART CATH AND CORS/GRAFTS ANGIOGRAPHY;  Surgeon: Troy Sine, MD;  Location: [place] CV LAB;  Service: Cardiovascular;  Laterality: N/A;   LEFT HEART CATHETERIZATION WITH CORONARY ANGIOGRAM N/A 07/23/2012   Procedure: LEFT HEART CATHETERIZATION WITH CORONARY ANGIOGRAM;  Surgeon: Leonie Man, MD;  Location[facility] C[facility] LAB;  Service: Cardiovascular;  Laterality: N/A;   LEXISCAN MYOVIEW  11/14/2011   Mild-moderate defect seen in Mid Inferolateral and Mid Anterolateral regions-consistant w/ infarct/scar. No significant ischemia demonstrated.   LUMBAR PERCUTANEOUS PEDICLE SCREW 4 LEVEL N/A 09/10/2021   Procedure: THORACIC SEVEN THROUGH THORACIC ELEVEN  PERCUTANEOUS PEDICLE SCREW PLACEMENT;  Surgeon:  Judith Part, MD;  Location: [place];  Service: Neurosurgery;  Laterality: N/A;   POLYPECTOMY  12/29/2017   Procedure: POLYPECTOMY;  Surgeon: [name], MD;  [facility];  Service: Endoscopy;;   PPM GENERATOR CHANGEOUT N/A 12/31/2020   Procedure: PPM GENERATOR CHANGEOUT;  Surgeon: Sanda Klein, MD;  Location: [place] CV LAB;  Service: Cardiovascular;  Laterality: N/A;   RIGHT/LEFT HEART CATH AND CORONARY ANGIOGRAPHY N/A 10/09/2016   Procedure: RIGHT/LEFT HEART CATH AND CORONARY ANGIOGRAPHY;  Surgeon: Jolaine Artist, MD;  Location: [employer] CV LAB;  Service: Cardiovascular;  Laterality: N/A;   RIGHT/LEFT HEART CATH AND CORONARY/GRAFT ANGIOGRAPHY N/A 03/11/2019   Procedure: RIGHT/LEFT HEART CATH AND  CORONARY/GRAFT ANGIOGRAPHY;  Surgeon: Jolaine Artist, MD;  Location: [place] CV LAB;  Service: Cardiovascular;  Laterality: N/A;   TEE WITHOUT CARDIOVERSION N/A 11/29/2014   Procedure: TRANSESOPHAGEAL ECHOCARDIOGRAM (TEE);  Surgeon: Melrose Nakayama, MD;  Location: [place];  Service: Open Heart Surgery;  Laterality: N/A;   TRANSCAROTID ARTERY REVASCULARIZATION  Right 10/01/2020   Procedure: RIGHT TRANSCAROTID ARTERY REVASCULARIZATION;  Surgeon: [name], MD;  Location: [place];  Service: Vascular;  Laterality: Right;   TRANSTHORACIC ECHOCARDIOGRAM  07/23/2012   EF 55-60%, normal-mild   TUBAL LIGATION     ULTRASOUND GUIDANCE FOR VASCULAR ACCESS Left 10/01/2020   Procedure: ULTRASOUND GUIDANCE FOR VASCULAR ACCESS;  Surgeon: [name], MD;  Location: [place];  Service: Vascular;  Laterality: Left;   HPI:  [name] is a 77 y.o. female with PMH significant of Atrial fibrillation, CAD (coronary artery disease), High cholesterol, Hyperplastic colon polyp, Hypertension, Obesity, OSA (obstructive sleep apnea), prior CVA, and Type II diabetes mellitus ([place]) who presents with confusion and aphasia. Head CT negative. Unable to have MRI secondary to pacemaker   Assessment / Plan / Recommendation Clinical Impression  Patient presents with mild aphasia, only noted during conversation, as well as cognitive deficits impacting attention, memory, and complex problem solving. She will benefit from f/u SLP services to address above areas as she was independent prior to entering SNF.    SLP Assessment  SLP Recommendation/Assessment: Patient needs continued Speech Lanaguage Pathology Services SLP Visit Diagnosis: Cognitive communication deficit (R41.841);Aphasia (R47.01)    Recommendations for follow up therapy are one component of a multi-disciplinary discharge planning process, led by the attending physician.  Recommendations may be updated based on patient status, additional  functional criteria and insurance authorization.             Frequency and Duration min 2x/week  2 weeks      SLP Evaluation Cognition  Overall Cognitive Status: Impaired/Different from baseline Arousal/Alertness: Awake/alert Orientation Level: Oriented X4 Attention: Sustained Sustained Attention: Impaired Sustained Attention Impairment: Verbal basic Memory: Impaired Memory Impairment: Storage deficit;Retrieval deficit Awareness: Appears intact Problem Solving: Impaired Problem Solving Impairment: Functional complex       Comprehension  Auditory Comprehension Overall Auditory Comprehension: Impaired Yes/No Questions: Within Functional Limits Commands: Impaired Complex Commands: 25-49% accurate Interfering Components: Attention Visual Recognition/Discrimination Discrimination: Within Function Limits Reading Comprehension Reading Status: Within funtional limits    Expression Expression Primary Mode of Expression: Verbal Verbal Expression Overall Verbal Expression: Impaired Initiation: No impairment Level of Generative/Spontaneous Verbalization: Conversation Repetition: Impaired Level of Impairment: Sentence level Naming: Impairment Responsive: 76-100% accurate Confrontation: Impaired Other Naming Comments: notable only in conversation Written Expression Dominant Hand: Right Written Expression: Not tested   Oral / Motor  Oral Motor/Sensory Function Overall Oral Motor/Sensory Function: Within functional  limits Motor Speech Overall Motor Speech: Appears within functional limits for tasks assessed           Gabriel Rainwater MA, CCC-SLP  Dawnita Molner Meryl 04/22/2022, 11:21 AM

## 2022-04-22 NOTE — Evaluation (Signed)
Occupational Therapy Evaluation Patient Details Name: [name] MRN: [id number] DOB: 1945/08/11 Today's Date: 04/22/2022   History of Present Illness Pt is a 77 y/o female presenting on 3/11 with confusion and aphasia from [place]. CT negative, MRI pending pacemaker clearance. Per neurology- high suspicion for stroke- likely L MCA. PMH includes: AICD/PPM, anemia, arthritis, Afib, CAD, chronic lower back pain, cout, HTN, IBS, obesity, OSA, CVA, DM2.   Clinical Impression   Patient admitted for above from SNF and presents with problem list below. She reports needing some assist for ADLS and using RW for transfers, working with therapy at [facility]. She completes ADLs with setup to total assist, bed mobility with min to mod assist and declines further mobilization at this time. Pt oriented to self, place, and time but demonstrating decreased awareness of situation.  Will follow acutely to progress as tolerated, continue to recommend post acute rehab to progress towards PLOF.      Recommendations for follow up therapy are one component of a multi-disciplinary discharge planning process, led by the attending physician.  Recommendations may be updated based on patient status, additional functional criteria and insurance authorization.   Follow Up Recommendations  Skilled nursing-short term rehab (<3 hours/day)     Assistance Recommended at Discharge Frequent or constant Supervision/Assistance  Patient can return home with the following Two people to help with walking and/or transfers;Help with stairs or ramp for entrance;A lot of help with bathing/dressing/bathroom;Direct supervision/assist for financial management;Direct supervision/assist for medications management;Assistance with cooking/housework;Assist for transportation    Functional Status Assessment  Patient has had a recent decline in their functional status and demonstrates the ability to make significant improvements in function in a  reasonable and predictable amount of time.  Equipment Recommendations  Other (comment) (defer)    Recommendations for Other Services       Precautions / Restrictions Precautions Precautions: Fall Restrictions Weight Bearing Restrictions: Yes LLE Weight Bearing: Partial weight bearing LLE Partial Weight Bearing Percentage or Pounds: 50% Other Position/Activity Restrictions: No active L hip abduction      Mobility Bed Mobility Overal bed mobility: Needs Assistance Bed Mobility: Rolling Rolling: Min assist, Mod assist         General bed mobility comments: Rolling to L with min assist, rolling to R with modA. Good initiation and use of bed rail. Pt declined further mobility at this time    Transfers                   General transfer comment: pt declined      Balance                                           ADL either performed or assessed with clinical judgement   ADL Overall ADL's : Needs assistance/impaired     Grooming: Set up;Bed level           Upper Body Dressing : Minimal assistance;Bed level   Lower Body Dressing: Total assistance;Bed level     Toilet Transfer Details (indicate cue type and reason): deferred         Functional mobility during ADLs: Minimal assistance;Moderate assistance General ADL Comments: limited to bed level     Vision   Vision Assessment?: No apparent visual deficits     Perception     Praxis      Pertinent Vitals/Pain Pain Assessment Pain Assessment:  No/denies pain     Hand Dominance Right   Extremity/Trunk Assessment Upper Extremity Assessment Upper Extremity Assessment: Generalized weakness   Lower Extremity Assessment Lower Extremity Assessment: Defer to PT evaluation RLE Deficits / Details: 3-/5 proximally, 4/5 distally LLE Deficits / Details: Grossly 2/5, prior hip sx       Communication Communication Communication: No difficulties   Cognition Arousal/Alertness:  Awake/alert Behavior During Therapy: WFL for tasks assessed/performed Overall Cognitive Status: Impaired/Different from baseline Area of Impairment: Orientation, Following commands, Problem solving                 Orientation Level: Disoriented to, Situation     Following Commands: Follows one step commands consistently, Follows one step commands with increased time, Follows multi-step commands inconsistently     Problem Solving: Slow processing, Requires verbal cues General Comments: pt self limiting, reports at hospital "because I forget things".  Encouargement to participate     General Comments       Exercises     Shoulder Instructions      Home Living Family/patient expects to be discharged to:: Skilled nursing facility     Type of Home: [place]                              Lives With:  (previously lived with son)    Prior Functioning/Environment Prior Level of Function : Needs assist             Mobility Comments: Working with PT at rehab, using a walker for transfers with a little  help and using wheelchair for distance ADLs Comments: has assist with bathing/dressing for LB but only a little assist for UB from bed, using diaper for toileting needs. able to feed self        OT Problem List: Decreased strength;Decreased activity tolerance;Impaired balance (sitting and/or standing);Decreased cognition;Decreased safety awareness;Decreased knowledge of use of DME or AE;Decreased knowledge of precautions;Obesity      OT Treatment/Interventions: Self-care/ADL training;Therapeutic exercise;DME and/or AE instruction;Therapeutic activities;Cognitive remediation/compensation;Patient/family education;Balance training    OT Goals(Current goals can be found in the care plan section) Acute Rehab OT Goals Patient Stated Goal: rehab OT Goal Formulation: With patient Time For Goal Achievement: 05/06/22 Potential to Achieve Goals: Good   OT Frequency: Min 2X/week    Co-evaluation              AM-PAC OT "6 Clicks" Daily Activity     Outcome Measure Help from another person eating meals?: A Little Help from another person taking care of personal grooming?: A Little Help from another person toileting, which includes using toliet, bedpan, or urinal?: Total Help from another person bathing (including washing, rinsing, drying)?: A Lot Help from another person to put on and taking off regular upper body clothing?: A Little Help from another person to put on and taking off regular lower body clothing?: Total 6 Click Score: 13   End of Session Nurse Communication: Mobility status  Activity Tolerance: Patient tolerated treatment well Patient left: in bed;with call bell/phone within reach;with bed alarm set  OT Visit Diagnosis: Other abnormalities of gait and mobility (R26.89);Muscle weakness (generalized) (M62.81);Other symptoms and signs involving cognitive function                Time: 1211-1227 OT Time Calculation (min): 16 min Charges:  OT General Charges $OT Visit: 1 Visit OT Evaluation $OT Eval Moderate Complexity: 1 Mod  Esteban Kobashigawa B, OT  Acute Rehabilitation Services Office [phone]   Delight Stare 04/22/2022, 1:03 PM

## 2022-04-22 NOTE — Progress Notes (Signed)
Carotid duplex bilateral and TCD study completed.   Please see CV Proc for preliminary results.   Marlyce Mcdougald, RDMS, RVT  

## 2022-04-22 NOTE — Progress Notes (Signed)
[facility] Progress Note Patient: [name] [id number] DOB: Jul 24, 1945 DOA: 04/21/2022  DOS: the patient was seen and examined on 04/22/2022  Brief hospital course: PMH of HTN, HLD, chronic combined CHF, chronic A-fib, CAD SP PCI,'s CHB SP PPM implant, chronic respiratory failure on 2 LPM, type II DM, GERD, OSA, obesity presented to hospital with complaints of confusion speech difficulty. Neurology was consulted.  CT head x 2 negative for acute stroke.  Suspected TIA.  Currently awaiting return back to SNF. Assessment and Plan: Acute metabolic encephalopathy Expressive aphasia Reported with confusional episode with difficulty speaking. CT of the head x 2 negative for any acute stroke. Unable to perform MRI due to pacemaker. Unable to perform CTA due to contrast allergy. Mentation improving.  Speech is very clear. Neurology was consulted. Recommended further workup including transcranial and carotid Dopplers which were unremarkable. LDL 82.  On Crestor increased to 40 mg.  On Plavix 75 mg daily prior to admission.  Now on aspirin and Brilinta for 4 weeks followed by aspirin alone. PT OT recommends SNF.  Elevated troponin CAD Acute on chronic.  Patient did not report any complaints of chest pain.  High-sensitivity troponins elevated at 158-> 164.  Last cardiac cath from 02/2019 had noted SVG to OM system chronically occluded with previous PCI of native left circumflex system with other grafts widely patent. -Continue Plavix   History of chronic pancreatitis Patient initially thought to have concern for pancreatitis, but symptoms thought more so secondary to candidal esophagitis.  Lipase 198, but appears to be trending down from prior as was noted to be 323 on 2/9.   Chronic respiratory failure with hypoxic  Asthma, without exacerbation Patient with prior PFTs which showed restrictive lung disease with minimal mild diffusion defect.  Patient on 3 L of oxygen at  baseline. -Continue nasal cannula oxygen 2-3 L 24/7. -Albuterol nebs as needed for shortness of breath -Continue pharmacy substitution for home inhalers   Heart failure with reduced EF Chronic.  Last echocardiogram noted EF to be 45 to 50% with indeterminate diastolic parameters in [id number].  Patient does not appear to be acutely overloaded at this time. -Strict intake and output  -Daily weights   Complete heart block s/p pacemaker Paroxysmal atrial fibrillation Patient appears to be in a sinus rhythm at this time.  She does not appear to be on anticoagulation at baseline, but does have a history of atrial fibrillation/flutter documented in the past. Currently no evidence of A-fib and therefore no indication for anticoagulation.   CKD stage IIIB/IV Creatinine 2.05 which appears around patient's baseline. -Continue to monitor   Anemia of chronic kidney disease Hemoglobin 11.3 which appears improved from prior of 9.3 on 2/11. -Continue to monitor   Controlled diabetes mellitus type 2, without long-term use of insulin Glucose mildly elevated at 154. Last available hemoglobin A1c was 5.6 on 10/2021. -Carb modified diet   Thrombocytopenia Chronic.  Platelet count 124 which appears around patient's baseline. -Continue to monitor   Dyslipidemia Total cholesterol 124, HDL 41, LDL 64, triglycerides 94, VLDL 19 on 03/18/2022. -Follow-up lipid panel ordered for in a.m. -LDL at goal less than 70 -Continue Crestor, dose increased.   GERD -Continue Protonix and Carafate   OSA Patient unable to tolerate CPAP.   Incidental COVID. Ct 38.7. previous positive of 03/24/2022.  Patient can come off isolation.   Subjective: No nausea no vomiting.  No fever no chills.  No chest pain.  No abdominal pain.  Physical Exam: General:  in Mild distress, No Rash Cardiovascular: S1 and S2 Present, No Murmur Respiratory: Good respiratory effort, Bilateral Air entry present. No Crackles, No  wheezes Abdomen: Bowel Sound present, No tenderness Extremities: No edema Neuro: Alert and oriented x3, no new focal deficit  Data Reviewed: I have Reviewed nursing notes, Vitals, and Lab results. Since last encounter, pertinent lab results CBC and BMP   . I have ordered test including echocardiogram  .   Disposition: Status is: Observation   Family Communication: No one at bedside Level of care: Telemetry Medical  Vitals:   04/22/22 0401 04/22/22 0834 04/22/22 1116 04/22/22 1615  BP: (!) 170/87 (!) 184/75 (!) 171/95 (!) 149/84  Pulse: 82 79 83 85  Resp: '18 18 18 18  '$ Temp: (!) 97.4 F (36.3 C) 98 F (36.7 C) 98.3 F (36.8 C) 97.6 F (36.4 C)  TempSrc: Oral Oral Oral Oral  SpO2: 97% 98% 100% 97%  Weight:      Height:         Author: Berle Mull, MD 04/22/2022 8:07 PM  Please look on [email or web address] to find out who is on call.

## 2022-04-22 NOTE — Hospital Course (Signed)
PMH of HTN, HLD, chronic combined CHF, chronic A-fib, CAD SP PCI,'s CHB SP PPM implant, chronic respiratory failure on 2 LPM, type II DM, GERD, OSA, obesity presented to hospital with complaints of confusion speech difficulty. Neurology was consulted.  CT head x 2 negative for acute stroke.  Suspected TIA.  Currently awaiting return back to SNF.

## 2022-04-22 NOTE — TOC Initial Note (Signed)
Transition of Care [facility]) - Initial/Assessment Note    Patient Details  Name: [name] MRN: [id number] Date of Birth: September 09, 1945  Transition of Care [facility]) CM/SW Contact:    Geralynn Ochs, LCSW Phone Number: 04/22/2022, 1:54 PM  Clinical Narrative:               Patient from [facility], but if she had a stroke, could benefit from rehab due to deficits. CSW confirmed patient can return to [place] and submitted request for insurance authorization. CSW to follow.   Expected Discharge Plan: Skilled Nursing Facility Barriers to Discharge: Continued Medical Work up, Ship broker   Patient Goals and CMS Choice Patient states their goals for this hospitalization and ongoing recovery are:: to feel better CMS Medicare.gov Compare Post Acute Care list provided to:: Patient Choice offered to / list presented to : Patient [place] ownership interest in [facility].provided to:: Patient    Expected Discharge Plan and Services     Post Acute Care Choice: [place] Living arrangements for the past 2 months: [place]                                      Prior Living Arrangements/Services Living arrangements for the past 2 months: [place] Lives with:: Facility Resident Patient language and need for interpreter reviewed:: No Do you feel safe going back to the place where you live?: Yes      Need for Family Participation in Patient Care: No (Comment) Care giver support system in place?: Yes (comment)   Criminal Activity/Legal Involvement Pertinent to Current Situation/Hospitalization: No - Comment as needed  Activities of Daily Living      Permission Sought/Granted Permission sought to share information with : Facility Art therapist granted to share information with : Yes, Verbal Permission Granted     Permission granted to share info w AGENCY: [facility]         Emotional Assessment   Attitude/Demeanor/Rapport: Engaged Affect (typically observed): Appropriate Orientation: : Oriented to Self, Oriented to Place, Oriented to  Time, Oriented to Situation Alcohol / Substance Use: Not Applicable Psych Involvement: No (comment)  Admission diagnosis:  Expressive aphasia [R47.01] Altered mental status, unspecified altered mental status type [R41.82] Patient Active Problem List   Diagnosis Date Noted   Expressive aphasia 0000000   Acute metabolic encephalopathy 0000000   Asthma without acute exacerbation 04/21/2022   Heart failure with reduced ejection fraction ([place]) 04/21/2022   Candida esophagitis ([place]) 03/20/2022   Gastritis and gastroduodenitis 03/20/2022   Nausea without vomiting 03/20/2022   Heme positive stool 03/19/2022   Acute on chronic anemia 03/19/2022   Abdominal pain, chronic, epigastric 03/19/2022   Acute pancreatitis 03/17/2022   Dyslipidemia 03/17/2022   GERD without esophagitis 03/17/2022   Coronary artery disease 03/17/2022   Pain from implanted hardware 02/06/2022   Diabetic ulcer of toe ([place]) 11/18/2021   Pain due to onychomycosis of toenails of both feet 11/18/2021   Pressure injury of back, stage 2 ([place]) 11/03/2021   Acute on chronic respiratory failure with hypoxia ([place]) 11/02/2021   History of fracture of left hip 09/10/2021   Iatrogenic adrenal insufficiency ([place]) 09/10/2021   T9 vertebral fracture ([place]) 09/09/2021   Acute kidney injury superimposed on chronic kidney disease ([place]) 09/09/2021   Chronic hypoxemic respiratory failure ([place]) 09/09/2021   Acute combined systolic and diastolic heart failure ([place])  Acute CHF (congestive heart failure) (HCC) 08/04/2021   Callus 07/19/2021   Hypervolemia associated with renal insufficiency 06/11/2021   Physical deconditioning 06/11/2021   Subcutaneous fat necrosis    Pressure injury of skin 04/13/2021   Closed left hip fracture ([place]) secondary to fall at home  04/03/2021   Closed fracture of left superior pubic ramus ([place]) 04/03/2021   Chronic kidney disease, stage III B (moderate) ([place]) 04/03/2021   Fall at home, initial encounter 04/03/2021   Hypokalemia 04/03/2021   Obesity hypoventilation syndrome ([place]) 04/02/2021   Primary osteoarthritis, left ankle and foot 02/05/2021   Allergy to shellfish 01/15/2021   Decreased estrogen level 01/15/2021   Diabetic renal disease ([place]) 01/15/2021   Gallstones 01/15/2021   Idiopathic gout 01/15/2021   Mild intermittent asthma 01/15/2021   Pure hypercholesterolemia 01/15/2021   CHF (congestive heart failure) ([place]) 01/07/2021   Chronic diastolic heart failure ([place]) 12/31/2020   Nasal congestion with rhinorrhea 10/24/2020   Carotid stenosis 10/01/2020   Ischemic cardiomyopathy 09/27/2020   Hypertension 09/19/2020   Acute left-sided weakness 09/16/2020   Blood blister 08/01/2020   Elevated troponin 06/01/2019   Irritable bowel syndrome with constipation 10/21/2018   Chronic pain 07/30/2018   Congestive heart failure (CHF) ([place]) 07/30/2018   Supplemental oxygen dependent 07/30/2018   Leukopenia 05/03/2018   Thrombocytopenia ([place]) 05/03/2018   Pneumonia due to human metapneumovirus 05/02/2018   Chest pain of uncertain etiology [id number]   Allergic rhinitis 04/13/2018   S/P laparoscopic cholecystectomy 04/09/2018   Respiratory failure ([place]) 03/07/2018   Polyp of cecum    Polyp of transverse colon    Positive colorectal cancer screening using Cologuard test    Dysphagia    Acute on chronic renal failure ([place]) 11/20/2017   Coronary artery disease of native artery of native heart with stable angina pectoris ([place]) 11/20/2017   Diabetes mellitus without complication ([place]) [id number]   Abdominal pain, epigastric 11/20/2017   Vasomotor rhinitis 10/16/2016   Right facial numbness 03/05/2016   ESRD (end stage renal disease) ([place]) 03/05/2016   Atrial fibrillation ([place]) 10/24/2015   Chest pain with  moderate risk of acute coronary syndrome 10/23/2015   History of TIA (transient ischemic attack) 10/22/2015   CHB (complete heart block) ([place]) 06/22/2015   Allergic drug rash due to anti-infective agent 04/29/2015   Fatigue 02/14/2015   Chronic diastolic CHF (congestive heart failure) ([place]) 02/14/2015   Cellulitis 12/21/2014   S/P CABG x 5 11/29/2014   CAD S/P percutaneous coronary angioplasty    Diarrhea 07/12/2014   Abdominal pain 07/12/2014   Acute on chronic diastolic heart failure ([place]) 11/04/2013   Mixed hypercholesterolemia and hypertriglyceridemia 04/22/2013   Vertigo 04/22/2013   Dyspnea on exertion 08/25/2012   Allergic to IV contrast 07/23/2012   Unstable angina ([place]) 07/22/2012   Obesity 11/22/2011   Cardiomyopathy ([place]) 11/22/2011   Gastroesophageal reflux disease 11/22/2011   Back pain 11/22/2011   OSA (obstructive sleep apnea) 11/22/2011   HEMORRHOIDS-EXTERNAL 02/21/2010   NAUSEA 02/21/2010   ABDOMINAL PAIN -GENERALIZED 02/21/2010   PERSONAL HX COLONIC POLYPS 02/21/2010   ANEMIA 04/16/2007   HTN (hypertension), benign 04/16/2007   DIVERTICULOSIS, COLON 04/16/2007   ARTHRITIS 04/16/2007   INTERNAL HEMORRHOIDS WITHOUT MENTION COMP 04/12/2007   Sleep apnea 04/12/2007   Cardiac resynchronization therapy pacemaker (CRT-P) in place 04/12/2007   Gastritis without bleeding 12/14/2006   COLONIC POLYPS, HYPERPLASTIC 09/27/2002   FATTY LIVER DISEASE 02/16/2002   PCP:  Lujean Amel, MD Pharmacy:  No Pharmacies Listed  Social Determinants of Health (SDOH) Social History: SDOH Screenings   Food Insecurity: No Food Insecurity (04/21/2022)  Housing: Low Risk  (04/21/2022)  Transportation Needs: No Transportation Needs (04/21/2022)  Recent Concern: Transportation Needs - Unmet Transportation Needs (02/06/2022)  Utilities: Not At Risk (04/21/2022)  Depression (PHQ2-9): Low Risk  (11/19/2021)  Financial Resource Strain: High Risk (12/18/2016)  Physical Activity:  Inactive (12/18/2016)  Social Connections: Moderately Integrated (02/05/2021)  Stress: Stress Concern Present (12/18/2016)  Tobacco Use: Medium Risk (04/21/2022)   SDOH Interventions:     Readmission Risk Interventions    03/21/2022    2:22 PM 01/10/2021    9:47 AM  Readmission Risk Prevention Plan  Transportation Screening Complete Complete  PCP or Specialist Appt within 3-5 Days  Complete  HRI or [place]  Complete  Social Work Consult for [place] Planning/Counseling  Complete  Palliative Care Screening  Not Applicable  Medication Review Press photographer) Complete Complete  PCP or Specialist appointment within 3-5 days of discharge Complete   HRI or [place] Complete   SW Recovery Care/Counseling Consult Complete   Palliative Care Screening Complete   Skilled Nursing Facility Complete

## 2022-04-22 NOTE — Progress Notes (Signed)
Received orders for swallow evaluation however patient has passed the [place] and swallowing well per RN. When questioned, patient states that she gets "strangled" on water but not other thin liquids. Observed her consuming water via straw via multiple sips and crackers with no indication of dysphagia. Will defer evaluation at this time however please re-consult if s/s of aspiration present themselves.   [place] MA, CCC-SLP

## 2022-04-23 ENCOUNTER — Observation Stay (HOSPITAL_COMMUNITY): Payer: 59

## 2022-04-23 DIAGNOSIS — Z1152 Encounter for screening for COVID-19: Secondary | ICD-10-CM | POA: Diagnosis not present

## 2022-04-23 DIAGNOSIS — G8929 Other chronic pain: Secondary | ICD-10-CM | POA: Diagnosis present

## 2022-04-23 DIAGNOSIS — B3781 Candidal esophagitis: Secondary | ICD-10-CM | POA: Diagnosis not present

## 2022-04-23 DIAGNOSIS — I5023 Acute on chronic systolic (congestive) heart failure: Secondary | ICD-10-CM

## 2022-04-23 DIAGNOSIS — K861 Other chronic pancreatitis: Secondary | ICD-10-CM | POA: Diagnosis present

## 2022-04-23 DIAGNOSIS — R4701 Aphasia: Secondary | ICD-10-CM | POA: Diagnosis not present

## 2022-04-23 DIAGNOSIS — E669 Obesity, unspecified: Secondary | ICD-10-CM | POA: Diagnosis present

## 2022-04-23 DIAGNOSIS — R404 Transient alteration of awareness: Secondary | ICD-10-CM | POA: Diagnosis not present

## 2022-04-23 DIAGNOSIS — N1832 Chronic kidney disease, stage 3b: Secondary | ICD-10-CM | POA: Diagnosis not present

## 2022-04-23 DIAGNOSIS — J45909 Unspecified asthma, uncomplicated: Secondary | ICD-10-CM | POA: Diagnosis present

## 2022-04-23 DIAGNOSIS — I5043 Acute on chronic combined systolic (congestive) and diastolic (congestive) heart failure: Secondary | ICD-10-CM | POA: Diagnosis present

## 2022-04-23 DIAGNOSIS — G4733 Obstructive sleep apnea (adult) (pediatric): Secondary | ICD-10-CM | POA: Diagnosis not present

## 2022-04-23 DIAGNOSIS — G9341 Metabolic encephalopathy: Secondary | ICD-10-CM | POA: Diagnosis not present

## 2022-04-23 DIAGNOSIS — I480 Paroxysmal atrial fibrillation: Secondary | ICD-10-CM | POA: Diagnosis not present

## 2022-04-23 DIAGNOSIS — Z7401 Bed confinement status: Secondary | ICD-10-CM | POA: Diagnosis not present

## 2022-04-23 DIAGNOSIS — I442 Atrioventricular block, complete: Secondary | ICD-10-CM | POA: Diagnosis present

## 2022-04-23 DIAGNOSIS — D696 Thrombocytopenia, unspecified: Secondary | ICD-10-CM | POA: Diagnosis present

## 2022-04-23 DIAGNOSIS — E1122 Type 2 diabetes mellitus with diabetic chronic kidney disease: Secondary | ICD-10-CM | POA: Diagnosis present

## 2022-04-23 DIAGNOSIS — J9611 Chronic respiratory failure with hypoxia: Secondary | ICD-10-CM | POA: Diagnosis not present

## 2022-04-23 DIAGNOSIS — I130 Hypertensive heart and chronic kidney disease with heart failure and stage 1 through stage 4 chronic kidney disease, or unspecified chronic kidney disease: Secondary | ICD-10-CM | POA: Diagnosis present

## 2022-04-23 DIAGNOSIS — K589 Irritable bowel syndrome without diarrhea: Secondary | ICD-10-CM | POA: Diagnosis present

## 2022-04-23 DIAGNOSIS — K219 Gastro-esophageal reflux disease without esophagitis: Secondary | ICD-10-CM | POA: Diagnosis present

## 2022-04-23 DIAGNOSIS — K760 Fatty (change of) liver, not elsewhere classified: Secondary | ICD-10-CM | POA: Diagnosis present

## 2022-04-23 DIAGNOSIS — D631 Anemia in chronic kidney disease: Secondary | ICD-10-CM | POA: Diagnosis present

## 2022-04-23 DIAGNOSIS — I482 Chronic atrial fibrillation, unspecified: Secondary | ICD-10-CM | POA: Diagnosis present

## 2022-04-23 DIAGNOSIS — K567 Ileus, unspecified: Secondary | ICD-10-CM | POA: Diagnosis not present

## 2022-04-23 DIAGNOSIS — I2510 Atherosclerotic heart disease of native coronary artery without angina pectoris: Secondary | ICD-10-CM | POA: Diagnosis present

## 2022-04-23 DIAGNOSIS — M545 Low back pain, unspecified: Secondary | ICD-10-CM | POA: Diagnosis present

## 2022-04-23 LAB — BASIC METABOLIC PANEL
Anion gap: 5 (ref 5–15)
BUN: 24 mg/dL — ABNORMAL HIGH (ref 8–23)
CO2: 20 mmol/L — ABNORMAL LOW (ref 22–32)
Calcium: 9.1 mg/dL (ref 8.9–10.3)
Chloride: 110 mmol/L (ref 98–111)
Creatinine, Ser: 1.95 mg/dL — ABNORMAL HIGH (ref 0.44–1.00)
GFR, Estimated: 26 mL/min — ABNORMAL LOW (ref >60)
Glucose, Bld: 127 mg/dL — ABNORMAL HIGH (ref 70–99)
Potassium: 3.9 mmol/L (ref 3.5–5.1)
Sodium: 135 mmol/L (ref 135–145)

## 2022-04-23 LAB — LIPASE, BLOOD: Lipase: 168 U/L — ABNORMAL HIGH (ref 11–51)

## 2022-04-23 LAB — CBC
HCT: 32.7 % — ABNORMAL LOW (ref 36.0–46.0)
Hemoglobin: 10.4 g/dL — ABNORMAL LOW (ref 12.0–15.0)
MCH: 28.5 pg (ref 26.0–34.0)
MCHC: 31.8 g/dL (ref 30.0–36.0)
MCV: 89.6 fL (ref 80.0–100.0)
Platelets: 99 10*3/uL — ABNORMAL LOW (ref 150–400)
RBC: 3.65 MIL/uL — ABNORMAL LOW (ref 3.87–5.11)
RDW: 17.2 % — ABNORMAL HIGH (ref 11.5–15.5)
WBC: 7.2 10*3/uL (ref 4.0–10.5)
nRBC: 0 % (ref 0.0–0.2)

## 2022-04-23 LAB — HEMOGLOBIN A1C
Hgb A1c MFr Bld: 5.8 % — ABNORMAL HIGH (ref 4.8–5.6)
Mean Plasma Glucose: 120 mg/dL

## 2022-04-23 LAB — BRAIN NATRIURETIC PEPTIDE: B Natriuretic Peptide: 961.8 pg/mL — ABNORMAL HIGH (ref 0.0–100.0)

## 2022-04-23 LAB — MAGNESIUM: Magnesium: 1.6 mg/dL — ABNORMAL LOW (ref 1.7–2.4)

## 2022-04-23 MED ORDER — FUROSEMIDE 10 MG/ML IJ SOLN
20.0000 mg | Freq: Two times a day (BID) | INTRAMUSCULAR | Status: DC
  Administered 2022-04-23 – 2022-04-25 (×5): 20 mg via INTRAVENOUS
  Filled 2022-04-23 (×5): qty 4

## 2022-04-23 NOTE — Care Management Obs Status (Signed)
[place] NOTIFICATION   Patient Details  Name: [name] MRN: [id number] Date of Birth: 03-09-1945   Medicare Observation Status Notification Given:  Yes    Geralynn Ochs, LCSW 04/23/2022, 10:55 AM

## 2022-04-23 NOTE — Progress Notes (Signed)
6 6 STROKE TEAM PROGRESS NOTE   INTERVAL HISTORY Patient is seen in her room with no family at the bedside.   Neurological exam is unchanged.  Repeat CT scan shows no acute abnormality.  Await transfer to SNF for rehab when bed available.  Vital signs stable. Vitals:   04/23/22 0024 04/23/22 0737 04/23/22 0905 04/23/22 1154  BP: (!) 185/71  (!) 155/86 132/75  Pulse: 83 80 (!) 108 64  Resp: '18 16 14 16  '$ Temp: 97.6 F (36.4 C)  98 F (36.7 C) 98.2 F (36.8 C)  TempSrc: Oral  Oral Oral  SpO2: 100%  100% 100%  Weight:      Height:       CBC:  Recent Labs  Lab 04/21/22 1214 04/22/22 0251 04/23/22 0245  WBC 6.9 6.1 7.2  NEUTROABS 5.2  --   --   HGB 11.3* 9.6* 10.4*  HCT 37.5 30.5* 32.7*  MCV 92.8 90.8 89.6  PLT 124* 98* 99*   Basic Metabolic Panel:  Recent Labs  Lab 04/22/22 0251 04/23/22 0245  NA 135 135  K 4.8 3.9  CL 111 110  CO2 19* 20*  GLUCOSE 105* 127*  BUN 27* 24*  CREATININE 1.97* 1.95*  CALCIUM 9.3 9.1  MG  --  1.6*   Lipid Panel:  Recent Labs  Lab 04/22/22 0251  CHOL 154  TRIG 121  HDL 48  CHOLHDL 3.2  VLDL 24  LDLCALC 82   HgbA1c:  Recent Labs  Lab 04/22/22 0251  HGBA1C 5.8*   Urine Drug Screen: No results for input(s): "LABOPIA", "COCAINSCRNUR", "LABBENZ", "AMPHETMU", "THCU", "LABBARB" in the last 168 hours.  Alcohol Level  Recent Labs  Lab 04/21/22 1213  ETH <10    IMAGING past 24 hours VAS [place] TRANSCRANIAL DOPPLER  Result Date: 04/23/2022  Transcranial Doppler Patient Name:  [name]  Date of Exam:   04/22/2022 Medical Rec #: [id number]          Accession #:    [id number] Date of Birth: 1945-06-12           Patient Gender: F Patient Age:   77 years Exam Location:  [facility] : [facility] Procedure:      VAS [place] TRANSCRANIAL DOPPLER Referring Phys: Elwin Sleight [place] --------------------------------------------------------------------------------  Indications: Stroke. Limitations for diagnostic windows: Unable to insonate right  transtemporal window. Unable to insonate left transtemporal window. Comparison Study: No prior studies. Performing Technologist: Darlin Coco RDMS, RVT  Examination Guidelines: A complete evaluation includes B-mode imaging, spectral Doppler, color Doppler, and power Doppler as needed of all accessible portions of each vessel. Bilateral testing is considered an integral part of a complete examination. Limited examinations for reoccurring indications may be performed as noted.  +----------+---------------+----------+-----------+------------------+ RIGHT TCD Right VM (cm/s)Depth (cm)Pulsatility     Comment       +----------+---------------+----------+-----------+------------------+ MCA                                           Unable to insonate +----------+---------------+----------+-----------+------------------+ ACA                                           Unable to insonate +----------+---------------+----------+-----------+------------------+ Term ICA  Unable to insonate +----------+---------------+----------+-----------+------------------+ PCA P1                                        Unable to insonate +----------+---------------+----------+-----------+------------------+ Opthalmic      17.00                  1.29                       +----------+---------------+----------+-----------+------------------+ ICA siphon                                    Unable to insonate +----------+---------------+----------+-----------+------------------+ Vertebral     -29.00                  0.87                       +----------+---------------+----------+-----------+------------------+  +----------+--------------+----------+-----------+------------------+ LEFT TCD  Left VM (cm/s)Depth (cm)Pulsatility     Comment       +----------+--------------+----------+-----------+------------------+ MCA                                           Unable to insonate +----------+--------------+----------+-----------+------------------+ ACA                                          Unable to insonate +----------+--------------+----------+-----------+------------------+ Term ICA                                     Unable to insonate +----------+--------------+----------+-----------+------------------+ PCA P1                                       Unable to insonate +----------+--------------+----------+-----------+------------------+ Opthalmic     14.00                  1.86                       +----------+--------------+----------+-----------+------------------+ ICA siphon    10.00                  0.77                       +----------+--------------+----------+-----------+------------------+ Vertebral     -19.00                 1.14                       +----------+--------------+----------+-----------+------------------+  +------------+-------+-------+             VM cm/sComment +------------+-------+-------+ Prox Basilar-18.00         +------------+-------+-------+ Dist Basilar-15.00         +------------+-------+-------+ Summary:  Low normal mean flow velocities in majority of identified vessels of anterior and posterior circulations. absent bitem[poral windows limits evaluation *See table(s) above for TCD measurements and observations.  Diagnosing physician: Antony Contras MD  Electronically signed by Antony Contras MD on 04/23/2022 at 11:50:45 AM.    Final    VAS US CAROTID  Result Date: 04/23/2022 Carotid Arterial Duplex Study Patient Name:  [name]  Date of Exam:   04/22/2022 Medical Rec #: [id number]          Accession #:    [id number] Date of Birth: [birth year]           Patient Gender: F Patient Age:   77 years Exam Location:  [facility] Procedure:      VAS US CAROTID Referring Phys: Alferd Patee [facility] --------------------------------------------------------------------------------   Indications:       CVA. Risk Factors:      Hypertension, hyperlipidemia, prior CVA. Other Factors:     OSA, morbid obesity, atria fibrillation, cardiomyopathy,                    AICD. Limitations        Today's exam was limited due to the body habitus of the                    patient and tortuosity of vessels. Comparison Study:  09-17-2020 Prior carotid duplex Performing Technologist: Darlin Coco RDMS, RVT  Examination Guidelines: A complete evaluation includes B-mode imaging, spectral Doppler, color Doppler, and power Doppler as needed of all accessible portions of each vessel. Bilateral testing is considered an integral part of a complete examination. Limited examinations for reoccurring indications may be performed as noted.  Right Carotid Findings: +----------+--------+--------+--------+------------------+--------+           PSV cm/sEDV cm/sStenosisPlaque DescriptionComments +----------+--------+--------+--------+------------------+--------+ CCA Prox  78      15                                tortuous +----------+--------+--------+--------+------------------+--------+ CCA Distal52      9                                          +----------+--------+--------+--------+------------------+--------+ ICA Prox  84      26      1-39%                     tortuous +----------+--------+--------+--------+------------------+--------+ ICA Mid   88      18                                tortuous +----------+--------+--------+--------+------------------+--------+ ICA Distal87      20                                tortuous +----------+--------+--------+--------+------------------+--------+ ECA       137     13                                         +----------+--------+--------+--------+------------------+--------+ +----------+--------+-------+----------------+-------------------+           PSV cm/sEDV cmsDescribe        Arm Pressure (mmHG)  +----------+--------+-------+----------------+-------------------+ [id number]            Multiphasic, WNL                    +----------+--------+-------+----------------+-------------------+ +---------+--------+--+--------+-+---------+  VertebralPSV cm/s42EDV cm/s8Antegrade +---------+--------+--+--------+-+---------+  Left Carotid Findings: +----------+--------+--------+--------+------------------+--------+           PSV cm/sEDV cm/sStenosisPlaque DescriptionComments +----------+--------+--------+--------+------------------+--------+ CCA Prox  86      14                                         +----------+--------+--------+--------+------------------+--------+ CCA Distal78      13                                         +----------+--------+--------+--------+------------------+--------+ ICA Prox  124     34      1-39%   heterogenous      tortuous +----------+--------+--------+--------+------------------+--------+ ICA Mid   115     19                                tortuous +----------+--------+--------+--------+------------------+--------+ ICA Distal78      23                                tortuous +----------+--------+--------+--------+------------------+--------+ ECA       126     16                                         +----------+--------+--------+--------+------------------+--------+ +----------+--------+--------+----------------+-------------------+           PSV cm/sEDV cm/sDescribe        Arm Pressure (mmHG) +----------+--------+--------+----------------+-------------------+ [id number]             Multiphasic, WNL                    +----------+--------+--------+----------------+-------------------+ +---------+--------+--+--------+-+---------+ VertebralPSV cm/s40EDV cm/s8Antegrade +---------+--------+--+--------+-+---------+   Summary: Right Carotid: Velocities in the right ICA are consistent with a 1-39% stenosis.  Left Carotid: Velocities in the left ICA are consistent with a 1-39% stenosis. Vertebrals:  Bilateral vertebral arteries demonstrate antegrade flow. Subclavians: Normal flow hemodynamics were seen in bilateral subclavian              arteries. *See table(s) above for measurements and observations.  Electronically signed by Antony Contras MD on 04/23/2022 at 11:49:29 AM.    Final    DG Abd Portable 1V  Result Date: 04/23/2022 CLINICAL DATA:  [id number] [place] [id number] EXAM: PORTABLE ABDOMEN - 1 VIEW COMPARISON:  CT 03/17/2022 FINDINGS: No evidence of bowel obstruction. Mild-to-moderate stool burden. Partially visualized thoracic spine fusion hardware and left hip arthroplasty with proximal femur plate and cerclage wire fixation. Multilevel degenerative changes of the spine. Vascular calcifications. Partially visualized pacemaker/AICD leads. IMPRESSION: No evidence of bowel obstruction.  Mild-to-moderate stool burden. Electronically Signed   By: Maurine Simmering M.D.   On: 04/23/2022 11:33   CT HEAD WO CONTRAST (5MM)  Result Date: 04/22/2022 CLINICAL DATA:  Stroke, follow-up.  Altered mental status. EXAM: CT HEAD WITHOUT CONTRAST TECHNIQUE: Contiguous axial images were obtained from the base of the skull through the vertex without intravenous contrast. RADIATION DOSE REDUCTION: This exam was performed according to the departmental dose-optimization program which includes automated exposure control, adjustment of the mA and/or kV according to patient size and/or use  of iterative reconstruction technique. COMPARISON:  Head CT 04/21/2022. FINDINGS: Brain: No acute hemorrhage. Unchanged chronic small-vessel disease. Cortical gray-white differentiation is otherwise preserved. Prominence of the ventricles and sulci within normal limits for age. No extra-axial collection. Basilar cisterns are patent. Vascular: No hyperdense vessel or unexpected calcification. Skull: No calvarial fracture or suspicious bone lesion. Skull base is  unremarkable. Sinuses/Orbits: Unremarkable. Other: None. IMPRESSION: 1. No acute intracranial abnormality. 2. Unchanged chronic small-vessel disease. Electronically Signed   By: Emmit Alexanders M.D.   On: 04/22/2022 14:06    PHYSICAL EXAM General: Alert, well-nourished, well-developed patient in no acute distress Respiratory: Regular, unlabored respirations on room air  NEURO:  Mental Status: AA&Ox3  Speech/Language: speech is with some word finding difficulties and impaired repetition.  Able to name only 3 animals with 4 feet when asked  Cranial Nerves:  II: PERRL. Visual fields full.  III, IV, VI: EOMI. Eyelids elevate symmetrically.  V: Sensation is intact to light touch and symmetrical to face.  VII: Smile is symmetrical.  VIII: hearing intact to voice. IX, X: Phonation is normal.  XII: tongue is midline without fasciculations. Motor: 5/5 strength to all muscle groups tested.  Tone: is normal and bulk is normal Sensation- Intact to light touch bilaterally.  Coordination: FTN intact bilaterally.No drift.  Gait- deferred   ASSESSMENT/PLAN [name] is a 78 y.o. female with history of nonischemic cardiomyopathy, AICD incompatible with MRI, anemia, asthma, arthritis, A-fib, CAD, fatty liver disease, GERD, hyperlipidemia, hypertension, stroke and diabetes who presented yesterday after breakfast when she noticed that she could not speak although she could understand when others were speaking.  She knew that she was having trouble speaking she felt worsening confusion and was brought to the emergency department.  The confusion has resolved, but patient still has some word finding difficulties and deficits and repetition.  She came from a rehab facility and verbalized concerns that she was not getting all of her medications there, to include her anticoagulation for A-fib.  As patient is unable to obtain MRI due to incompatible pacemaker, will repeat head CT 24 hours after first  study to assess for presence of stroke.  Stroke: Likely small left-sided stroke but not seen on CT scan x 2.  Unable to obtain MRI due to incompatible pacemaker Etiology: Likely embolic in the setting of ? atrial fibrillation off anticoagulation CT head No acute abnormality. Small vessel disease.  Follow-up CT 3/12 pending MRI unable to obtain due to pacemaker Carotid Doppler bilateral 1-39% carotid stenosis. Transcranial Doppler suboptimal study due to poor windows.  Low normal velocities throughout LDL 82 HgbA1c 5.6 VTE prophylaxis -SQH    Diet   Diet heart healthy/carb modified Room service appropriate? Yes; Fluid consistency: Thin; Fluid restriction: 1500 mL Fluid   clopidogrel 75 mg daily prior to admission, now on aspirin 81 mg daily and brilinta  x 4 weeks and then aspirin daily. Therapy recommendations: SNF Disposition: Pending  Hypertension Home meds: None Stable Permissive hypertension (OK if < 220/120) but gradually normalize in 5-7 days Long-term BP goal normotensive  Hyperlipidemia Home meds: Rosuvastatin 20 mg daily increased to 40 mg LDL 82, goal < 70 Continue statin at discharge  Atrial fibrillation Episodes of A-fib seen on patient's pacemaker checks in the past Pacemaker interrogated, demonstrating no recent episodes of atrial fibrillation No need for anticoagulation at this time  Other Stroke Risk Factors Advanced Age >/= 57  Former cigarette smoker Hx stroke Coronary artery disease  Other Active Problems  None  Hospital day # 0   Patient presented with transient episode of expressive language difficulties and confusion which appears to have resolved.    MRI cannot be obtained due to incompatible pacemaker.   CT x 2 is negative.   Continue dual antiplatelet therapy aspirin and Brilinta for 4 weeks followed by aspirin alone.  Long discussion with patient and answered questions.  Discussed with Dr. [place] Pink.  Stroke team will sign off.  Kindly call for  questions greater than 50% time during this 73mnute visit was spent counseling and coordination of care about TIA versus small stroke not visualized on CT scan answering questions  PAntony Contras MD Medical Director MWaldwickPager: [phone]/13/2024 1:27 PM   To contact Stroke Continuity provider, please refer to [email or web address] After hours, contact General Neurology

## 2022-04-23 NOTE — Progress Notes (Signed)
Physical Therapy Treatment Patient Details Name: [name] MRN: [id number] DOB: 24-Jun-1945 Today's Date: 04/23/2022   History of Present Illness Pt is a 77 y/o female presenting on 3/11 with confusion and aphasia from [place]. CT negative, MRI unable due to  pacemaker. Per neurology- high suspicion for stroke- likely L MCA. PMH includes: AICD/PPM, anemia, arthritis, Afib, CAD, chronic lower back pain, cout, HTN, IBS, obesity, OSA, CVA, DM2, 1/24 hospitalization for non-union left hip fx with conversion from ORIF to hemiarthroplasty.    PT Comments    Patient reporting continued abd pain. Pre-medicated for pain and willing to work with PT. Focus of session on OOB to chair. Patient requires min assist with RW, however appears to be putting more than 50% weight on LLE when stepping with RLE (poor use of UEs via walker to unload LLE). Patient limited to bed to chair only due to this. Patient eager to return to SNF and continue her rehab.     Recommendations for follow up therapy are one component of a multi-disciplinary discharge planning process, led by the attending physician.  Recommendations may be updated based on patient status, additional functional criteria and insurance authorization.  Follow Up Recommendations  Skilled nursing-short term rehab (<3 hours/day) Can patient physically be transported by private vehicle: No   Assistance Recommended at Discharge Intermittent Supervision/Assistance  Patient can return home with the following A little help with walking and/or transfers;A little help with bathing/dressing/bathroom   Equipment Recommendations  None recommended by PT    Recommendations for Other Services       Precautions / Restrictions Precautions Precautions: Fall Restrictions Weight Bearing Restrictions: No LLE Weight Bearing: Partial weight bearing LLE Partial Weight Bearing Percentage or Pounds: 50% Other Position/Activity Restrictions: Post hip precautions;  No active L hip abduction     Mobility  Bed Mobility Overal bed mobility: Needs Assistance Bed Mobility: Rolling, Sidelying to Sit Rolling: Min guard Sidelying to sit: Min assist       General bed mobility comments: roll to left with rail; assist to raise torso; able to scoot to EOB with incr time and effort with minguard assist    Transfers Overall transfer level: Needs assistance Equipment used: Rolling walker (2 wheels) Transfers: Sit to/from Stand, Bed to chair/wheelchair/BSC Sit to Stand: Min assist   Step pivot transfers: Min assist       General transfer comment: pt able to verbalize PWB precautions however does not adhere to them with poor use of UEs to unload LLE when step-pivoting    Ambulation/Gait               General Gait Details: unable due to inability to maintain [facility]   Stairs             Wheelchair Mobility    Modified Rankin (Stroke Patients Only) Modified Rankin (Stroke Patients Only) Pre-Morbid Rankin Score: Severe disability Modified Rankin: Severe disability     Balance Overall balance assessment: Needs assistance Sitting-balance support: No upper extremity supported, Feet supported Sitting balance-Leahy Scale: Fair     Standing balance support: Bilateral upper extremity supported, Reliant on assistive device for balance Standing balance-Leahy Scale: Poor                              Cognition Arousal/Alertness: Awake/alert Behavior During Therapy: WFL for tasks assessed/performed Overall Cognitive Status: Impaired/Different from baseline Area of Impairment: Following commands, Problem solving, Memory  Memory: Decreased recall of precautions Following Commands: Follows one step commands consistently, Follows one step commands with increased time, Follows multi-step commands inconsistently     Problem Solving: Slow processing, Requires verbal cues General Comments: could not  recall all hip precautions (only no flexion >90 and PWB 50%); delayed responses/slow processing        Exercises      General Comments        Pertinent Vitals/Pain Pain Assessment Pain Assessment: Faces Faces Pain Scale: Hurts a little bit Pain Location: abd Pain Descriptors / Indicators: Nagging Pain Intervention(s): Limited activity within patient's tolerance, Premedicated before session    Home Living                          Prior Function            PT Goals (current goals can now be found in the care plan section) Acute Rehab PT Goals Patient Stated Goal: to feel better Time For Goal Achievement: 05/06/22 Potential to Achieve Goals: Good Progress towards PT goals: Progressing toward goals    Frequency    Min 2X/week      PT Plan Current plan remains appropriate    Co-evaluation              AM-PAC PT "6 Clicks" Mobility   Outcome Measure  Help needed turning from your back to your side while in a flat bed without using bedrails?: A Little Help needed moving from lying on your back to sitting on the side of a flat bed without using bedrails?: A Little Help needed moving to and from a bed to a chair (including a wheelchair)?: A Little Help needed standing up from a chair using your arms (e.g., wheelchair or bedside chair)?: A Little Help needed to walk in hospital room?: Total Help needed climbing 3-5 steps with a railing? : Total 6 Click Score: 14    End of Session Equipment Utilized During Treatment: Gait belt;Oxygen Activity Tolerance: Patient tolerated treatment well Patient left: with call bell/phone within reach;in chair;with chair alarm set;with nursing/sitter in room Nurse Communication: Mobility status PT Visit Diagnosis: Difficulty in walking, not elsewhere classified (R26.2);Muscle weakness (generalized) (M62.81)     Time: [id number] PT Time Calculation (min) (ACUTE ONLY): 23 min  Charges:  $Gait Training: 8-22  mins $Therapeutic Activity: 8-22 mins                      Arby Barrette, PT Acute Rehabilitation Services  Office [phone]    Rexanne Mano 04/23/2022, 11:32 AM

## 2022-04-23 NOTE — Progress Notes (Signed)
[facility] Progress Note Patient: [name] [id number] DOB: 07-01-1945 DOA: 04/21/2022  DOS: the patient was seen and examined on 04/23/2022  Brief hospital course: PMH of HTN, HLD, chronic combined CHF, chronic A-fib, CAD SP PCI,'s CHB SP PPM implant, chronic respiratory failure on 2 LPM, type II DM, GERD, OSA, obesity presented to hospital with complaints of confusion speech difficulty. Neurology was consulted.  CT head x 2 negative for acute stroke.  Suspected TIA.  Currently awaiting return back to SNF.  Assessment and Plan: Acute metabolic encephalopathy-resolved Expressive aphasia-resolved Reported with confusional episode with difficulty speaking. CT of the head x 2 negative for any acute stroke. Unable to perform MRI due to pacemaker. Unable to perform CTA due to contrast allergy. Mentation improving.  Speech is very clear. Neurology was consulted. Recommended further workup including transcranial and carotid Dopplers which were unremarkable. LDL 82.  On Crestor increased to 40 mg.  On Plavix 75 mg daily prior to admission.  Now on aspirin and Brilinta for 4 weeks followed by aspirin alone. PT OT recommends SNF.  Elevated troponin CAD Acute on chronic.  Patient did not report any complaints of chest pain.  High-sensitivity troponins elevated at 158-> 164.  Last cardiac cath from 02/2019 had noted SVG to OM system chronically occluded with previous PCI of native left circumflex system with other grafts widely patent. -Continue Plavix   History of chronic pancreatitis Patient initially thought to have concern for pancreatitis, but symptoms thought more so secondary to candidal esophagitis.  Lipase 198, but appears to be trending down from prior as was noted to be 323 on 2/9.  Repeat lipase on 3/13 continue to trend downward.   Chronic respiratory failure with hypoxic  Asthma, without exacerbation Patient with prior PFTs which showed restrictive lung disease with  minimal mild diffusion defect.  Patient on 3 L of oxygen at baseline. -Continue nasal cannula oxygen 2-3 L 24/7. -Albuterol nebs as needed for shortness of breath -Continue pharmacy substitution for home inhalers   Acute on chronic systolic heart failure heart failure with reduced EF Chronic.  Last echocardiogram noted EF to be 45 to 50% with indeterminate diastolic parameters in [id number].  BNP checked on 3/13 elevated at 961, (which actually may be deceivingly low given obesity).  IV Lasix started.   Complete heart block s/p pacemaker Paroxysmal atrial fibrillation Patient appears to be in a sinus rhythm at this time.  She does not appear to be on anticoagulation at baseline, but does have a history of atrial fibrillation/flutter documented in the past..  Currently no evidence of A-fib and therefore no indication for anticoagulation.   CKD stage IIIB/IV Creatinine 2.05 which appears around patient's baseline. -Continue to monitor   Anemia of chronic kidney disease Hemoglobin 11.3 which appears improved from prior of 9.3 on 2/11. -Continue to monitor   Controlled diabetes mellitus type 2, without long-term use of insulin Glucose mildly elevated at 154. Last available hemoglobin A1c was 5.6 on 10/2021. -Carb modified diet   Thrombocytopenia Chronic.  Platelet count 124 which appears around patient's baseline. -Continue to monitor   Dyslipidemia Total cholesterol 124, HDL 41, LDL 64, triglycerides 94, VLDL 19 on 03/18/2022. -Follow-up lipid panel ordered for in a.m. -LDL at goal less than 70 -Continue Crestor, dose increased.   GERD -Continue Protonix and Carafate   OSA Patient unable to tolerate CPAP.   Incidental COVID. Ct 38.7. previous positive of 03/24/2022.  Patient can come off isolation.   Subjective: Patient complains of  midepigastric abdominal pain  Physical Exam: General: Mild distress secondary to pain, alert and oriented x 2 Cardiovascular: Regular rate and rhythm,  S1-S2 Respiratory: Bibasilar crackles Abdomen: Soft, mild tender most in the upper mid epigastric area, nondistended, hypoactive bowel sounds Extremities: No clubbing, cyanosis or edema Neuro: Oriented x 3, no focal deficits  Data Reviewed: Creatinine at 1.95, magnesium of 1.6, BNP of 962  Disposition: Status is: Inpatient   Family Communication: Will call family Level of care: Telemetry Medical  Vitals:   04/23/22 0024 04/23/22 0737 04/23/22 0905 04/23/22 1154  BP: (!) 185/71  (!) 155/86 132/75  Pulse: 83 80 (!) 108 64  Resp: '18 16 14 16  '$ Temp: 97.6 F (36.4 C)  98 F (36.7 C) 98.2 F (36.8 C)  TempSrc: Oral  Oral Oral  SpO2: 100%  100% 100%  Weight:      Height:         Author: Annita Brod, MD 04/23/2022 3:44 PM  Please look on [email or web address] to find out who is on call.

## 2022-04-23 NOTE — TOC Progression Note (Signed)
Transition of Care [facility]) - Progression Note    Patient Details  Name: [name] MRN: [id number] Date of Birth: [birth year]  Transition of Care [facility] CM/SW [place], [place] Phone Number: 04/23/2022, 3:21 PM  Clinical Narrative:   [facility] Medicare asking for PT update with patient doing out of bed mobility. CSW contacted Acute Rehab office for PT to see, and PT note sent in to insurance for review. Authorization pending. CSW to follow.    Expected Discharge Plan: [place] Barriers to Discharge: Continued Medical Work up, Ship broker  Expected Discharge Plan and [place] Choice: [place] Living arrangements for the past 2 months: [place]                                       Social Determinants of Health (SDOH) Interventions [place]: No Food Insecurity (04/21/2022)  Housing: Low Risk  (04/21/2022)  Transportation Needs: No Transportation Needs (04/21/2022)  Recent Concern: Transportation Needs - Unmet Transportation Needs (02/06/2022)  Utilities: Not At Risk (04/21/2022)  Depression (PHQ2-9): Low Risk  (11/19/2021)  Financial Resource Strain: High Risk (12/18/2016)  Physical Activity: Inactive (12/18/2016)  Social Connections: Moderately Integrated (02/05/2021)  Stress: Stress Concern Present (12/18/2016)  Tobacco Use: Medium Risk (04/21/2022)    Readmission Risk Interventions    03/21/2022    2:22 PM 01/10/2021    9:47 AM  Readmission Risk Prevention Plan  Transportation Screening Complete Complete  PCP or Specialist Appt within 3-5 Days  Complete  HRI or [place]  Complete  Social Work Consult for [place] Planning/Counseling  Complete  Palliative Care Screening  Not Applicable  Medication Review Press photographer) Complete Complete  PCP or Specialist appointment within 3-5 days of discharge Complete   HRI or [place]  Complete   SW Recovery Care/Counseling Consult Complete   Palliative Care Screening Complete   Skilled Nursing Facility Complete

## 2022-04-23 NOTE — Plan of Care (Signed)
  Problem: Education: Goal: Knowledge of disease or condition will improve Outcome: Progressing Goal: Knowledge of secondary prevention will improve (MUST DOCUMENT ALL) Outcome: Progressing Goal: Knowledge of patient specific risk factors will improve [name] N/A or DELETE if not current risk factor) Outcome: Progressing   Problem: Ischemic Stroke/TIA Tissue Perfusion: Goal: Complications of ischemic stroke/TIA will be minimized Outcome: Progressing   Problem: Coping: Goal: Will verbalize positive feelings about self Outcome: Progressing Goal: Will identify appropriate support needs Outcome: Progressing   Problem: Health Behavior/Discharge Planning: Goal: Ability to manage health-related needs will improve Outcome: Progressing Goal: Goals will be collaboratively established with patient/family Outcome: Progressing   Problem: Self-Care: Goal: Ability to participate in self-care as condition permits will improve Outcome: Progressing Goal: Verbalization of feelings and concerns over difficulty with self-care will improve Outcome: Progressing Goal: Ability to communicate needs accurately will improve Outcome: Progressing   Problem: Nutrition: Goal: Risk of aspiration will decrease Outcome: Progressing Goal: Dietary intake will improve Outcome: Progressing   Problem: Education: Goal: Knowledge of General Education information will improve Description: Including pain rating scale, medication(s)/side effects and non-pharmacologic comfort measures Outcome: Progressing   Problem: Health Behavior/Discharge Planning: Goal: Ability to manage health-related needs will improve Outcome: Progressing   Problem: Clinical Measurements: Goal: Ability to maintain clinical measurements within normal limits will improve Outcome: Progressing Goal: Will remain free from infection Outcome: Progressing Goal: Diagnostic test results will improve Outcome: Progressing Goal: Respiratory  complications will improve Outcome: Progressing Goal: Cardiovascular complication will be avoided Outcome: Progressing   Problem: Nutrition: Goal: Adequate nutrition will be maintained Outcome: Progressing   Problem: Coping: Goal: Level of anxiety will decrease Outcome: Progressing   Problem: Elimination: Goal: Will not experience complications related to bowel motility Outcome: Progressing Goal: Will not experience complications related to urinary retention Outcome: Progressing   Problem: Safety: Goal: Ability to remain free from injury will improve Outcome: Progressing   Problem: Skin Integrity: Goal: Risk for impaired skin integrity will decrease Outcome: Progressing

## 2022-04-24 ENCOUNTER — Inpatient Hospital Stay (HOSPITAL_COMMUNITY): Admission: RE | Admit: 2022-04-24 | Payer: 59 | Source: Ambulatory Visit

## 2022-04-24 ENCOUNTER — Encounter (HOSPITAL_COMMUNITY): Payer: 59

## 2022-04-24 LAB — BASIC METABOLIC PANEL
Anion gap: 12 (ref 5–15)
BUN: 30 mg/dL — ABNORMAL HIGH (ref 8–23)
CO2: 19 mmol/L — ABNORMAL LOW (ref 22–32)
Calcium: 9.2 mg/dL (ref 8.9–10.3)
Chloride: 104 mmol/L (ref 98–111)
Creatinine, Ser: 1.99 mg/dL — ABNORMAL HIGH (ref 0.44–1.00)
GFR, Estimated: 25 mL/min — ABNORMAL LOW (ref >60)
Glucose, Bld: 124 mg/dL — ABNORMAL HIGH (ref 70–99)
Potassium: 3.3 mmol/L — ABNORMAL LOW (ref 3.5–5.1)
Sodium: 135 mmol/L (ref 135–145)

## 2022-04-24 LAB — CBC
HCT: 35 % — ABNORMAL LOW (ref 36.0–46.0)
Hemoglobin: 10.7 g/dL — ABNORMAL LOW (ref 12.0–15.0)
MCH: 27.9 pg (ref 26.0–34.0)
MCHC: 30.6 g/dL (ref 30.0–36.0)
MCV: 91.4 fL (ref 80.0–100.0)
Platelets: 105 10*3/uL — ABNORMAL LOW (ref 150–400)
RBC: 3.83 MIL/uL — ABNORMAL LOW (ref 3.87–5.11)
RDW: 17.6 % — ABNORMAL HIGH (ref 11.5–15.5)
WBC: 7.5 10*3/uL (ref 4.0–10.5)
nRBC: 0 % (ref 0.0–0.2)

## 2022-04-24 LAB — MAGNESIUM: Magnesium: 1.6 mg/dL — ABNORMAL LOW (ref 1.7–2.4)

## 2022-04-24 MED ORDER — GERHARDT'S BUTT CREAM
TOPICAL_CREAM | Freq: Two times a day (BID) | CUTANEOUS | Status: DC
Start: 2022-04-24 — End: 2022-04-25
  Filled 2022-04-24: qty 1

## 2022-04-24 MED ORDER — ALBUMIN HUMAN 5 % IV SOLN
12.5000 g | Freq: Once | INTRAVENOUS | Status: AC
  Administered 2022-04-24: 12.5 g via INTRAVENOUS
  Filled 2022-04-24: qty 250

## 2022-04-24 MED ORDER — ASPIRIN 81 MG PO TBEC
81.0000 mg | DELAYED_RELEASE_TABLET | Freq: Every day | ORAL | Status: DC
  Administered 2022-04-24 – 2022-04-25 (×2): 81 mg via ORAL
  Filled 2022-04-24 (×2): qty 1

## 2022-04-24 NOTE — Progress Notes (Signed)
Mobility Specialist: Progress Note   04/24/22 1614  Mobility  Activity Stood at bedside (x2)  Level of Assistance Moderate assist, patient does 50-74%  Assistive Device Front wheel walker  LLE Weight Bearing PWB  LLE Partial Weight Bearing Percentage or Pounds 50  Activity Response Tolerated fair  Mobility Referral Yes  $Mobility charge 1 Mobility   Pt received in the bed and agreeable to mobility. ModA with bed mobility as well as to stand. Session completed on 3 L/min [place]. C/o BLE pain, no rating given. C/o mild dizziness as well, resolved when sitting. Pt unable to maintain PWB status during session despite cues. Pt assisted back to bed after session with call bell at her side. Bed alarm is on.   Crouch Haily Caley Mobility Specialist Please contact via SecureChat or Rehab office at [phone]

## 2022-04-24 NOTE — Progress Notes (Signed)
Occupational Therapy Treatment Patient Details Name: [name] MRN: [id number] DOB: 25-Oct-1945 Today's Date: 04/24/2022   History of present illness Pt is a 77 y/o female presenting on 3/11 with confusion and aphasia from [place]. CT negative, MRI unable due to  pacemaker. Per neurology- high suspicion for stroke- likely L MCA. PMH includes: AICD/PPM, anemia, arthritis, Afib, CAD, chronic lower back pain, cout, HTN, IBS, obesity, OSA, CVA, DM2, 1/24 hospitalization for non-union left hip fx with conversion from ORIF to hemiarthroplasty.   OT comments  Patient supine in bed and agreeable to OT session. Completing bed mobility with min -mod assist, transfers into standing at EOB with min assist.  Sitting EOB completing grooming tasks with setup, simulated LB bathing with max assist and LB dressing with max assist.  She remains limited by weakness, discomfort in L hip/generalized pain, and decreased activity tolerance.  Continue to recommend rehab.    Recommendations for follow up therapy are one component of a multi-disciplinary discharge planning process, led by the attending physician.  Recommendations may be updated based on patient status, additional functional criteria and insurance authorization.    Follow Up Recommendations  Skilled nursing-short term rehab (<3 hours/day)     Assistance Recommended at Discharge Frequent or constant Supervision/Assistance  Patient can return home with the following  Help with stairs or ramp for entrance;Direct supervision/assist for financial management;Direct supervision/assist for medications management;Assistance with cooking/housework;Assist for transportation;A lot of help with walking and/or transfers;A lot of help with bathing/dressing/bathroom   Equipment Recommendations  Other (comment) (defer)    Recommendations for Other Services      Precautions / Restrictions Precautions Precautions: Fall Restrictions Weight Bearing  Restrictions: Yes LLE Weight Bearing: Partial weight bearing LLE Partial Weight Bearing Percentage or Pounds: 50% Other Position/Activity Restrictions: Post hip precautions; No active L hip abduction       Mobility Bed Mobility Overal bed mobility: Needs Assistance Bed Mobility: Rolling, Sidelying to Sit, Sit to Sidelying Rolling: Min assist Sidelying to sit: Min assist     Sit to sidelying: Mod assist General bed mobility comments: transtioned to EOB with trunk support to ascend, returned back to bed for mgmt of L LE into bed and trunk support    Transfers Overall transfer level: Needs assistance Equipment used: Rolling walker (2 wheels) Transfers: Sit to/from Stand Sit to Stand: Min assist           General transfer comment: min assist to power up into standing, good recall of hand placement.  Lateral scooting to [facility] with min assist.     Balance Overall balance assessment: Needs assistance Sitting-balance support: No upper extremity supported, Feet supported Sitting balance-Leahy Scale: Fair     Standing balance support: Bilateral upper extremity supported, During functional activity Standing balance-Leahy Scale: Poor                             ADL either performed or assessed with clinical judgement   ADL Overall ADL's : Needs assistance/impaired             Lower Body Bathing: Maximal assistance;Sit to/from stand Lower Body Bathing Details (indicate cue type and reason): simulated applying lotion to BLEs, pt able to don on upper legs and requires assist below knees Upper Body Dressing : Set up;Sitting   Lower Body Dressing: Maximal assistance;Sit to/from stand Lower Body Dressing Details (indicate cue type and reason): min assist for slip on shoes, relies on BUE support in  standing and             Functional mobility during ADLs: Minimal assistance;Rolling walker (2 wheels)      Extremity/Trunk Assessment              Vision        Perception     Praxis      Cognition Arousal/Alertness: Awake/alert Behavior During Therapy: WFL for tasks assessed/performed Overall Cognitive Status: Impaired/Different from baseline Area of Impairment: Following commands, Problem solving, Memory                     Memory: Decreased recall of precautions Following Commands: Follows one step commands consistently, Follows one step commands with increased time, Follows multi-step commands inconsistently     Problem Solving: Slow processing, Requires verbal cues General Comments: decreased problem solving, slow processing        Exercises      Shoulder Instructions       General Comments BUE strength with chair pushups at EOB x 5 reps    Pertinent Vitals/ Pain       Pain Assessment Pain Assessment: Faces Faces Pain Scale: Hurts a little bit Pain Location: generalized, L hip Pain Descriptors / Indicators: Discomfort Pain Intervention(s): Limited activity within patient's tolerance, Monitored during session, Repositioned  Home Living                                          Prior Functioning/Environment              Frequency  Min 2X/week        Progress Toward Goals  OT Goals(current goals can now be found in the care plan section)  Progress towards OT goals: Progressing toward goals  Acute Rehab OT Goals Patient Stated Goal: back to rehab OT Goal Formulation: With patient Time For Goal Achievement: 05/06/22 Potential to Achieve Goals: Good  Plan Discharge plan remains appropriate;Frequency remains appropriate    Co-evaluation                 AM-PAC OT "6 Clicks" Daily Activity     Outcome Measure   Help from another person eating meals?: A Little Help from another person taking care of personal grooming?: A Little Help from another person toileting, which includes using toliet, bedpan, or urinal?: Total Help from another person bathing (including  washing, rinsing, drying)?: A Lot Help from another person to put on and taking off regular upper body clothing?: A Little Help from another person to put on and taking off regular lower body clothing?: A Lot 6 Click Score: 14    End of Session Equipment Utilized During Treatment: Gait belt;Rolling walker (2 wheels)  OT Visit Diagnosis: Other abnormalities of gait and mobility (R26.89);Muscle weakness (generalized) (M62.81);Other symptoms and signs involving cognitive function   Activity Tolerance Patient tolerated treatment well   Patient Left in bed;with call bell/phone within reach;with bed alarm set   Nurse Communication Mobility status        Time: [id number] OT Time Calculation (min): 28 min  Charges: OT General Charges $OT Visit: 1 Visit OT Treatments $Self Care/Home Management : 8-22 mins $Therapeutic Activity: 8-22 mins  Jolaine Artist, OT [place] [phone]   Delight Stare 04/24/2022, 1:14 PM

## 2022-04-24 NOTE — Progress Notes (Signed)
[facility] Progress Note Patient: [name] [id number] DOB: [birth year] DOA: 04/21/2022  DOS: the patient was seen and examined on 04/24/2022  Brief hospital course: PMH of HTN, HLD, chronic combined CHF, chronic A-fib, CAD SP PCI,'s CHB SP PPM implant, chronic respiratory failure on 2 LPM, type II DM, GERD, OSA, obesity presented to hospital with complaints of confusion speech difficulty. Neurology was consulted.  CT head x 2 negative for acute stroke.  Suspected TIA.  Currently awaiting return back to SNF.  Assessment and Plan: Acute metabolic encephalopathy-resolved Expressive aphasia-resolved Reported with confusional episode with difficulty speaking. CT of the head x 2 negative for any acute stroke. Unable to perform MRI due to pacemaker. Unable to perform CTA due to contrast allergy. Mentation improving.  Speech is very clear. Neurology was consulted. Recommended further workup including transcranial and carotid Dopplers which were unremarkable. LDL 82.  On Crestor increased to 40 mg.  On Plavix 75 mg daily prior to admission.  Now on aspirin and Brilinta for 4 weeks followed by aspirin alone. PT OT recommends SNF.  Elevated troponin CAD Acute on chronic.  Patient did not report any complaints of chest pain.  High-sensitivity troponins elevated at 158-> 164.  Last cardiac cath from 02/2019 had noted SVG to OM system chronically occluded with previous PCI of native left circumflex system with other grafts widely patent. -Continue Plavix   History of chronic pancreatitis Patient initially thought to have concern for pancreatitis, but symptoms thought more so secondary to candidal esophagitis.  Lipase 198, but appears to be trending down from prior as was noted to be 323 on 2/9.  Repeat lipase on 3/13 continue to trend downward.   Chronic respiratory failure with hypoxic  Asthma, without exacerbation Patient with prior PFTs which showed restrictive lung disease with  minimal mild diffusion defect.  Patient on 3 L of oxygen at baseline. -Continue nasal cannula oxygen 2-3 L 24/7. -Albuterol nebs as needed for shortness of breath -Continue pharmacy substitution for home inhalers   Acute on chronic systolic heart failure heart failure with reduced EF Chronic.  Last echocardiogram noted EF to be 45 to 50% with indeterminate diastolic parameters in A999333.  BNP checked on 3/13 elevated at 961, (which actually may be deceivingly low given obesity).  IV Lasix started and patient has since diuresed over 2 L, no longer net positive.  Have added IV albumin   Complete heart block s/p pacemaker Paroxysmal atrial fibrillation Patient appears to be in a sinus rhythm at this time.  She does not appear to be on anticoagulation at baseline, but does have a history of atrial fibrillation/flutter documented in the past..  Currently no evidence of A-fib and therefore no indication for anticoagulation.   CKD stage IIIB/IV Creatinine 2.05 which appears around patient's baseline. -Continue to monitor   Anemia of chronic kidney disease Hemoglobin 11.3 which appears improved from prior of 9.3 on 2/11. -Continue to monitor   Controlled diabetes mellitus type 2, without long-term use of insulin Glucose mildly elevated at 154. Last available hemoglobin A1c was 5.6 on 10/2021. -Carb modified diet   Thrombocytopenia Chronic.  Platelet count 124 which appears around patient's baseline. -Continue to monitor   Dyslipidemia Total cholesterol 124, HDL 41, LDL 64, triglycerides 94, VLDL 19 on 03/18/2022. -Follow-up lipid panel ordered for in a.m. -LDL at goal less than 70 -Continue Crestor, dose increased.   GERD -Continue Protonix and Carafate   OSA Patient unable to tolerate CPAP.   Incidental COVID. Ct  38.7. previous positive of 03/24/2022.  Patient can come off isolation.   Subjective: Patient complains of midepigastric abdominal pain  Physical Exam: General: Alert and  oriented x 3, no acute distress Cardiovascular: Regular rate and rhythm, S1-S2 Respiratory: Decreased breath sounds bibasilar Abdomen: Soft, mild tender most in the upper mid epigastric area, nondistended, hypoactive bowel sounds Extremities: No clubbing, cyanosis or edema Neuro: Oriented x 3, no focal deficits  Data Reviewed: NA to 1.99  Disposition: Status is: Inpatient   Family Communication: Will call family Level of care: Telemetry Medical  Vitals:   04/24/22 0309 04/24/22 0734 04/24/22 1125 04/24/22 1551  BP: (!) 159/76 138/88 138/81 (!) 157/65  Pulse: 61 78 (!) 108 (!) 44  Resp: '18 17 18 16  '$ Temp: 98 F (36.7 C) 97.6 F (36.4 C) (!) 97.5 F (36.4 C) 98.4 F (36.9 C)  TempSrc:  Oral Oral Oral  SpO2: 100% 100% 100% 100%  Weight:      Height:         Author: Annita Brod, MD 04/24/2022 4:25 PM  Please look on [email or web address] to find out who is on call.

## 2022-04-25 LAB — CBC
HCT: 32.5 % — ABNORMAL LOW (ref 36.0–46.0)
Hemoglobin: 10.3 g/dL — ABNORMAL LOW (ref 12.0–15.0)
MCH: 28 pg (ref 26.0–34.0)
MCHC: 31.7 g/dL (ref 30.0–36.0)
MCV: 88.3 fL (ref 80.0–100.0)
Platelets: 102 10*3/uL — ABNORMAL LOW (ref 150–400)
RBC: 3.68 MIL/uL — ABNORMAL LOW (ref 3.87–5.11)
RDW: 17.5 % — ABNORMAL HIGH (ref 11.5–15.5)
WBC: 7.1 10*3/uL (ref 4.0–10.5)
nRBC: 0 % (ref 0.0–0.2)

## 2022-04-25 LAB — BASIC METABOLIC PANEL
Anion gap: 7 (ref 5–15)
BUN: 34 mg/dL — ABNORMAL HIGH (ref 8–23)
CO2: 21 mmol/L — ABNORMAL LOW (ref 22–32)
Calcium: 8.9 mg/dL (ref 8.9–10.3)
Chloride: 107 mmol/L (ref 98–111)
Creatinine, Ser: 2.28 mg/dL — ABNORMAL HIGH (ref 0.44–1.00)
GFR, Estimated: 21 mL/min — ABNORMAL LOW (ref >60)
Glucose, Bld: 125 mg/dL — ABNORMAL HIGH (ref 70–99)
Potassium: 2.9 mmol/L — ABNORMAL LOW (ref 3.5–5.1)
Sodium: 135 mmol/L (ref 135–145)

## 2022-04-25 LAB — MAGNESIUM: Magnesium: 1.7 mg/dL (ref 1.7–2.4)

## 2022-04-25 LAB — BRAIN NATRIURETIC PEPTIDE: B Natriuretic Peptide: 488.3 pg/mL — ABNORMAL HIGH (ref 0.0–100.0)

## 2022-04-25 MED ORDER — TICAGRELOR 90 MG PO TABS: 90.0000 mg | ORAL_TABLET | Freq: Two times a day (BID) | ORAL | 0 refills | Status: DC

## 2022-04-25 MED ORDER — TICAGRELOR 90 MG PO TABS: 90.0000 mg | ORAL_TABLET | Freq: Two times a day (BID) | ORAL | 3 refills | Status: DC

## 2022-04-25 MED ORDER — ASPIRIN 81 MG PO TBEC: 81.0000 mg | DELAYED_RELEASE_TABLET | Freq: Every day | ORAL | 12 refills | Status: DC

## 2022-04-25 MED ORDER — FOLIC ACID 1 MG PO TABS: 1.0000 mg | ORAL_TABLET | Freq: Every day | ORAL | 0 refills | Status: AC

## 2022-04-25 MED ORDER — GERHARDT'S BUTT CREAM: 1.0000 | TOPICAL_CREAM | Freq: Two times a day (BID) | CUTANEOUS | 1 refills | Status: DC

## 2022-04-25 MED ORDER — ISOSORBIDE MONONITRATE ER 30 MG PO TB24: 30.0000 mg | ORAL_TABLET | Freq: Every day | ORAL | 3 refills | Status: DC

## 2022-04-25 MED ORDER — ROSUVASTATIN CALCIUM 40 MG PO TABS: 40.0000 mg | ORAL_TABLET | Freq: Every evening | ORAL | 1 refills | Status: DC

## 2022-04-25 MED ORDER — OXYCODONE HCL 5 MG PO TABS: 5.0000 mg | ORAL_TABLET | Freq: Four times a day (QID) | ORAL | 0 refills | Status: DC | PRN

## 2022-04-25 NOTE — Progress Notes (Signed)
Physical Therapy Treatment Patient Details Name: [name] MRN: [id number] DOB: November 09, 1945 Today's Date: 04/25/2022   History of Present Illness Pt is a 77 y/o female presenting on 3/11 with confusion and aphasia from [place]. CT negative, MRI unable due to  pacemaker. Per neurology- high suspicion for stroke- likely L MCA. PMH includes: AICD/PPM, anemia, arthritis, Afib, CAD, chronic lower back pain, cout, HTN, IBS, obesity, OSA, CVA, DM2, 1/24 hospitalization for non-union left hip fx with conversion from ORIF to hemiarthroplasty.    PT Comments    Upon arrival, pt was fatigued and sore from sitting up in the recliner, requesting assistance back to bed. Pt needed increased assistance of maxA using the bed pad as a sling and x3 attempts before successfully transferring to stand from the recliner, likely due to the factors mentioned above limiting her active participation. She required modA to take pivotal steps towards the R to the bed with the RW, but continues to not maintain her weight bearing precautions, despite max cues. When returning to supine from sitting L EOB, she reported feeling "swimmy headed" and did report it felt like the room was spinning, but pt declined any further formal vestibular assessment/tests at this time due to being fatigued. She denied dizziness when rotating her head L <> R while supine in bed though. Will continue to follow acutely. Current recommendations remain appropriate.     Recommendations for follow up therapy are one component of a multi-disciplinary discharge planning process, led by the attending physician.  Recommendations may be updated based on patient status, additional functional criteria and insurance authorization.  Follow Up Recommendations  Skilled nursing-short term rehab (<3 hours/day) Can patient physically be transported by private vehicle: No   Assistance Recommended at Discharge Intermittent Supervision/Assistance  Patient can  return home with the following A little help with bathing/dressing/bathroom;A lot of help with walking and/or transfers;Assistance with cooking/housework;Assist for transportation   Equipment Recommendations  None recommended by PT    Recommendations for Other Services       Precautions / Restrictions Precautions Precautions: Fall Restrictions Weight Bearing Restrictions: No LLE Weight Bearing: Partial weight bearing LLE Partial Weight Bearing Percentage or Pounds: 50% Other Position/Activity Restrictions: Post hip precautions; No active L hip abduction     Mobility  Bed Mobility Overal bed mobility: Needs Assistance Bed Mobility: Rolling, Sit to Supine Rolling: Min assist     Sit to supine: Mod assist   General bed mobility comments: Pt entering L side of bed, cuing pt to lean onto her L elbow and control her trunk while therapist assisted bil legs up onto bed to return to supine. MinA using bed pad to rotate hips further to roll to R, guarding her L hip to ensure she maintained her precautions throughout.    Transfers Overall transfer level: Needs assistance Equipment used: Rolling walker (2 wheels) Transfers: Sit to/from Stand, Bed to chair/wheelchair/BSC Sit to Stand: Max assist   Step pivot transfers: Mod assist       General transfer comment: Pt needing x3 attempts before eventually being successful with transfer to stand from the recliner, cuing her to push up from the armrests, place L foot anterior to R to reduce L weightbearing during the transfer. Pt needing maxA using the bed pad as a sling under her buttocks to come to stand on the 3rd attempt. ModA for stability and guidance of RW to step to R towards bed. Cues provided to push up through the RW to unweight her L  leg, but pt not maintaining her weightbearing precautions, thus did not progress gait further.    Ambulation/Gait               General Gait Details: unable due to inability to maintain  [facility]   Stairs             Wheelchair Mobility    Modified Rankin (Stroke Patients Only) Modified Rankin (Stroke Patients Only) Pre-Morbid Rankin Score: Severe disability Modified Rankin: Severe disability     Balance Overall balance assessment: Needs assistance Sitting-balance support: No upper extremity supported, Feet supported Sitting balance-Leahy Scale: Fair     Standing balance support: Bilateral upper extremity supported, Reliant on assistive device for balance Standing balance-Leahy Scale: Poor                              Cognition Arousal/Alertness: Awake/alert Behavior During Therapy: WFL for tasks assessed/performed Overall Cognitive Status: Impaired/Different from baseline Area of Impairment: Following commands, Problem solving, Memory                     Memory: Decreased recall of precautions Following Commands: Follows one step commands consistently, Follows one step commands with increased time, Follows multi-step commands inconsistently     Problem Solving: Slow processing, Requires verbal cues, Difficulty sequencing General Comments: could not recall all hip precautions (only PWB 50%); delayed responses/slow processing        Exercises General Exercises - Lower Extremity Long Arc Quad: AROM, Both, 5 reps, Seated (assistance needed to lift L foot off ground to allow pt to perform AROM LAQ)    General Comments General comments (skin integrity, edema, etc.): pt declining further mobility or exercises after returned to supine; reported dizziness with transition sitting L EOB to supine but declined any vestibular testing at this time other than looking L <> R while supine in bed and pt denying symptoms then      Pertinent Vitals/Pain Pain Assessment Pain Assessment: Faces Faces Pain Scale: Hurts even more Pain Location: abd, L hip Pain Descriptors / Indicators: Cramping, Moaning, Discomfort, Grimacing, Guarding Pain  Intervention(s): Limited activity within patient's tolerance, Monitored during session, Repositioned    Home Living                          Prior Function            PT Goals (current goals can now be found in the care plan section) Acute Rehab PT Goals Patient Stated Goal: to go to her SNF PT Goal Formulation: With patient Time For Goal Achievement: 05/06/22 Potential to Achieve Goals: Good Progress towards PT goals: Progressing toward goals    Frequency    Min 2X/week      PT Plan Current plan remains appropriate    Co-evaluation              AM-PAC PT "6 Clicks" Mobility   Outcome Measure  Help needed turning from your back to your side while in a flat bed without using bedrails?: A Little Help needed moving from lying on your back to sitting on the side of a flat bed without using bedrails?: A Lot Help needed moving to and from a bed to a chair (including a wheelchair)?: A Lot Help needed standing up from a chair using your arms (e.g., wheelchair or bedside chair)?: A Lot Help needed to walk in hospital room?: Total  Help needed climbing 3-5 steps with a railing? : Total 6 Click Score: 11    End of Session Equipment Utilized During Treatment: Gait belt;Oxygen Activity Tolerance: Patient limited by pain;Patient limited by fatigue Patient left: with call bell/phone within reach;in bed;with bed alarm set   PT Visit Diagnosis: Difficulty in walking, not elsewhere classified (R26.2);Muscle weakness (generalized) (M62.81);Unsteadiness on feet (R26.81);Other abnormalities of gait and mobility (R26.89)     Time: [id number] PT Time Calculation (min) (ACUTE ONLY): 28 min  Charges:  $Therapeutic Activity: 23-37 mins                     Moishe Spice, PT, DPT Acute Rehabilitation Services  Office: [phone]    Orvan Falconer 04/25/2022, 2:01 PM

## 2022-04-25 NOTE — TOC Transition Note (Signed)
Transition of Care [facility]) - CM/SW Discharge Note   Patient Details  Name: [name] MRN: [id number] Date of Birth: 1945-02-12  [facility]) CM/SW Contact:  Geralynn Ochs, LCSW Phone Number: 04/25/2022, 1:03 PM   Clinical Narrative:   CSW updated by MD that patient's insurance has denied SNF. [facility] to readmit to LTC. CSW sent discharge summary. Transport arranged with [facility] for next available.  Nurse to call report to [phone], Room 102-A.    Final next level of care: Skilled Nursing Facility Barriers to Discharge: Barriers Resolved   Patient Goals and CMS Choice CMS Medicare.gov Compare Post Acute Care list provided to:: Patient Choice offered to / list presented to : Patient  Discharge Placement                Patient [facility] bed at: [facility] Patient to be transferred to facility by: [place] Name of family member notified: Self Patient and family notified of of transfer: 04/25/22  Discharge Plan and Services Additional resources added to the After Visit Summary for       Post Acute Care Choice: [place]                               Social Determinants of Health (SDOH) Interventions SDOH Screenings   Food Insecurity: No Food Insecurity (04/21/2022)  Housing: Low Risk  (04/21/2022)  Transportation Needs: No Transportation Needs (04/21/2022)  Recent Concern: Transportation Needs - Unmet Transportation Needs (02/06/2022)  Utilities: Not At Risk (04/21/2022)  Depression (PHQ2-9): Low Risk  (11/19/2021)  Financial Resource Strain: High Risk (12/18/2016)  Physical Activity: Inactive (12/18/2016)  Social Connections: Moderately Integrated (02/05/2021)  Stress: Stress Concern Present (12/18/2016)  Tobacco Use: Medium Risk (04/21/2022)     Readmission Risk Interventions    03/21/2022    2:22 PM 01/10/2021    9:47 AM  Readmission Risk Prevention Plan  Transportation Screening Complete Complete  PCP or  Specialist Appt within 3-5 Days  Complete  HRI or [place]  Complete  Social Work Consult for [place] Planning/Counseling  Complete  Palliative Care Screening  Not Applicable  Medication Review Press photographer) Complete Complete  PCP or Specialist appointment within 3-5 days of discharge Complete   HRI or [place] Complete   SW Recovery Care/Counseling Consult Complete   Palliative Care Screening Complete   Skilled Nursing Facility Complete

## 2022-04-25 NOTE — TOC Progression Note (Signed)
Transition of Care [facility]) - Progression Note    Patient Details  Name: [name] MRN: [id number] Date of Birth: [birth year]  Transition of Care [facility]) CM/SW [place], [place] Phone Number: 04/25/2022, 10:16 AM  Clinical Narrative:   CSW contacted that patient's insurance authorization has gone to peer to peer review. CSW sent peer to peer information to MD. CSW to follow.    Expected Discharge Plan: [place] Barriers to Discharge: Continued Medical Work up, Ship broker  Expected Discharge Plan and [place] Choice: [place] Living arrangements for the past 2 months: [place]                                       Social Determinants of Health (SDOH) Interventions [place]: No Food Insecurity (04/21/2022)  Housing: Low Risk  (04/21/2022)  Transportation Needs: No Transportation Needs (04/21/2022)  Recent Concern: Transportation Needs - Unmet Transportation Needs (02/06/2022)  Utilities: Not At Risk (04/21/2022)  Depression (PHQ2-9): Low Risk  (11/19/2021)  Financial Resource Strain: High Risk (12/18/2016)  Physical Activity: Inactive (12/18/2016)  Social Connections: Moderately Integrated (02/05/2021)  Stress: Stress Concern Present (12/18/2016)  Tobacco Use: Medium Risk (04/21/2022)    Readmission Risk Interventions    03/21/2022    2:22 PM 01/10/2021    9:47 AM  Readmission Risk Prevention Plan  Transportation Screening Complete Complete  PCP or Specialist Appt within 3-5 Days  Complete  HRI or [place]  Complete  Social Work Consult for [employer] Planning/Counseling  Complete  Palliative Care Screening  Not Applicable  Medication Review Press photographer) Complete Complete  PCP or Specialist appointment within 3-5 days of discharge Complete   HRI or [place] Complete   SW Recovery Care/Counseling Consult Complete    Palliative Care Screening Complete   Skilled Nursing Facility Complete

## 2022-04-25 NOTE — Progress Notes (Signed)
Mobility Specialist: Progress Note   04/25/22 1107  Mobility  Activity Stood at bedside;Transferred from bed to chair  Level of Assistance Moderate assist, patient does 50-74%  Assistive Device Front wheel walker;Stedy  LLE Weight Bearing PWB  LLE Partial Weight Bearing Percentage or Pounds 50  Activity Response Tolerated well  Mobility Referral Yes  $Mobility charge 1 Mobility   Pt received in the bed and agreeable to mobility. ModA with bed mobility as well as to stand. Verbal cues and physical assist to maintain WB status, pt unable. Pt able to recall 1/3 hip precautions. Stood x1 with RW and x1 with the stedy. Transferred to the chair with the stedy. No c/o throughout. Pt is in the chair with call bell and phone in reach. Chair alarm is on.   Reader Marylyn Appenzeller Mobility Specialist Please contact via SecureChat or Rehab office at [phone]

## 2022-04-25 NOTE — Discharge Summary (Signed)
Physician Discharge Summary   Patient: [name] MRN: [id number] DOB: 1945/04/15  Admit date:     04/21/2022  Discharge date: 04/25/22  Discharge Physician: Annita Brod   PCP: Lujean Amel, MD   Recommendations at discharge:   Medication change: Plavix discontinued New medication: Aspirin 81 mg p.o. daily New medication: Brilinta 90 mg p.o. twice daily times 4 weeks Medication change: K-Dur discontinued Medication change: Crestor increased from 20 mg to 40 mg Medication change: Imdur decreased from 60 mg to 30 mg p.o. daily.  If patient's blood pressure starts to trend back up, this medication can be increased back to 60 mg  Discharge Diagnoses: Principal Problem:   Expressive aphasia Active Problems:   Acute metabolic encephalopathy   CAD S/P percutaneous coronary angioplasty   Elevated troponin   Candida esophagitis (HCC)   Chronic hypoxemic respiratory failure (HCC)   Asthma without acute exacerbation   Heart failure with reduced ejection fraction (HCC)   CHB (complete heart block) (HCC)   Atrial fibrillation (HCC)   Chronic kidney disease, stage III B (moderate) (HCC)   ANEMIA   Thrombocytopenia (HCC)   Diabetes mellitus without complication (HCC)   Gastroesophageal reflux disease   OSA (obstructive sleep apnea)   Acute on chronic systolic CHF (congestive heart failure) ([place])  Resolved Problems:   * No resolved hospital problems. [facility] Course: PMH of HTN, HLD, chronic combined CHF, chronic A-fib, CAD SP PCI,'s CHB SP PPM implant, chronic respiratory failure on 2 LPM, type II DM, GERD, OSA, obesity presented to hospital with complaints of confusion speech difficulty. Neurology was consulted.  CT head x 2 negative for acute stroke.  Suspected TIA.  Currently awaiting return back to SNF.  Assessment and Plan: Acute metabolic encephalopathy-resolved Expressive aphasia-resolved Reported with confusional episode with difficulty speaking.  CT of the  head x 2 negative for any acute stroke.  Unable to perform MRI due to pacemaker.  Unable to perform CTA due to contrast allergy.  Mentation improving.  Speech is very clear. Neurology was consulted.  Recommended further workup including transcranial and carotid Dopplers which were unremarkable. LDL 82.  On Crestor already and dose increased to 40 mg.  On Plavix 75 mg daily prior to admission.  Plavix discontinued and patient now on aspirin and Brilinta for 4 weeks followed by aspirin alone. PT OT recommends SNF, however, insurance denied this, so patient returning back to long-term care.   Elevated troponin CAD Acute on chronic.  Patient did not report any complaints of chest pain.  High-sensitivity troponins elevated at 158-> 164.  Last cardiac cath from 02/2019 had noted SVG to OM system chronically occluded with previous PCI of native left circumflex system with other grafts widely patent. Now on aspirin and Brilinta   History of chronic pancreatitis Patient initially thought to have concern for pancreatitis, but symptoms thought more so secondary to candidal esophagitis.  Lipase 198, but appears to be trending down from prior as was noted to be 323 on 2/9.  Repeat lipase on 3/13 continue to trend downward.   Chronic respiratory failure with hypoxic  Asthma, without exacerbation Patient with prior PFTs which showed restrictive lung disease with minimal mild diffusion defect.  Patient on 3 L of oxygen at baseline. -Continue nasal cannula oxygen 2-3 L 24/7. -Albuterol nebs as needed for shortness of breath -Continue pharmacy substitution for home inhalers   Acute on chronic systolic heart failure heart failure with reduced EF Chronic.  Last echocardiogram noted EF  to be 45 to 50% with indeterminate diastolic parameters in [id number].  BNP checked on 3/13 elevated at 961, (which actually may be deceivingly low given obesity).  IV Lasix started and patient has since diuresed over 3 L and is -1 L  deficient.  Following diuresis, blood pressure lower and patient's Imdur had been on hold since admission.  Restarted at lower dose.   Complete heart block s/p pacemaker Paroxysmal atrial fibrillation Patient appears to be in a sinus rhythm at this time.  She does not appear to be on anticoagulation at baseline, but does have a history of atrial fibrillation/flutter documented in the past..  Currently no evidence of A-fib and therefore no indication for anticoagulation.   CKD stage IIIB/IV Creatinine 2.22 which appears around patient's baseline. -Continue to monitor   Anemia of chronic kidney disease Hemoglobin 11.3 which appears improved from prior of 9.3 on 2/11. -Continue to monitor   Controlled diabetes mellitus type 2, without long-term use of insulin Glucose mildly elevated at 154. Last available hemoglobin A1c was 5.6 on 10/2021. -Carb modified diet   Thrombocytopenia Chronic.  Platelet count 124 which appears around patient's baseline. -Continue to monitor   Dyslipidemia Total cholesterol 124, HDL 41, LDL 64, triglycerides 94, VLDL 19 on 03/18/2022. -Follow-up lipid panel ordered for in a.m. -LDL at goal less than 70 -Continue Crestor, dose increased.   GERD -Continue Protonix and Carafate  Hypokalemia Replaced as needed, secondary to diuresis    OSA Patient unable to tolerate CPAP.       Pain control - [employer] Controlled Substance Reporting System database was reviewed. and patient was instructed, not to drive, operate heavy machinery, perform activities at heights, swimming or participation in water activities or provide baby-sitting services while on Pain, Sleep and Anxiety Medications; until their outpatient Physician has advised to do so again. Also recommended to not to take more than prescribed Pain, Sleep and Anxiety Medications.  Consultants: Neurology Procedures performed: Echocardiogram Disposition: Long-term skilled nursing Diet recommendation:   Discharge Diet Orders (From admission, onward)     Start     Ordered   04/25/22 0000  Diet - low sodium heart healthy        04/25/22 1219   04/25/22 0000  Diet Carb Modified        04/25/22 1219           Heart healthy carb modified DISCHARGE MEDICATION: Allergies as of 04/25/2022       Reactions   Ivp Dye [iodinated Contrast Media] Shortness Of Breath   No reaction to PO contrast with non-ionic dye.06-25-2014/rsm   Shellfish Allergy Anaphylaxis   Sulfa Antibiotics Shortness Of Breath   Iodine Hives   Benadryl [diphenhydramine] Other (See Comments)   "THIS DRIVES ME CRAZY AND MAKES ME FEEL LIKE I AM DYING"   Lipitor [atorvastatin] Other (See Comments)   Myalgias    Mitigare [colchicine] Nausea And Vomiting   Uloric [febuxostat] Other (See Comments)   Unknown reaction Documented on MAR   Vibra-tab [doxycycline] Other (See Comments)   Unknown reaction Documented on MAR   Keflex [cephalexin] Itching, Rash   Zithromax [azithromycin] Rash        Medication List     STOP taking these medications    clopidogrel 75 MG tablet Commonly known as: PLAVIX   furosemide 40 MG tablet Commonly known as: LASIX   potassium chloride SA 20 MEQ tablet Commonly known as: KLOR-CON M       TAKE these medications  acetaminophen 325 MG tablet Commonly known as: TYLENOL Take 2 tablets (650 mg total) by mouth every 6 (six) hours as needed for mild pain (or Fever >/= 101).   albuterol 108 (90 Base) MCG/ACT inhaler Commonly known as: VENTOLIN HFA Inhale 2 puffs into the lungs every 6 (six) hours as needed for wheezing or shortness of breath.   albuterol (2.5 MG/3ML) 0.083% nebulizer solution Commonly known as: PROVENTIL Take 3 mLs (2.5 mg total) by nebulization every 6 (six) hours as needed for wheezing or shortness of breath.   allopurinol 100 MG tablet Commonly known as: ZYLOPRIM Take 1 tablet (100 mg total) by mouth daily. Please contact the office to schedule  appointment for additional refills. 1st Attempt. What changed: additional instructions   aspirin EC 81 MG tablet Take 1 tablet (81 mg total) by mouth daily. Swallow whole. Start taking on: April 26, 2022   barrier cream Crea Commonly known as: non-specified Apply 1 Application topically See admin instructions. Apply topically to buttocks every shift and as needed for each incontinence episode.   budesonide-formoterol 160-4.5 MCG/ACT inhaler Commonly known as: SYMBICORT Inhale 1 puff into the lungs 2 (two) times daily.   carboxymethylcellulose 0.5 % Soln Commonly known as: REFRESH PLUS Place 1 drop into both eyes 3 (three) times daily as needed (dry eyes).   cyanocobalamin 1000 MCG tablet Commonly known as: VITAMIN B12 Take 1 tablet (1,000 mcg total) by mouth daily.   cycloSPORINE 0.05 % ophthalmic emulsion Commonly known as: RESTASIS Place 1 drop into both eyes 2 (two) times daily.   dicyclomine 10 MG capsule Commonly known as: BENTYL Take 1 capsule (10 mg total) by mouth every 6 (six) hours as needed for spasms.   ferrous sulfate 325 (65 FE) MG tablet Take 1 tablet (325 mg total) by mouth daily with breakfast.   fluticasone 50 MCG/ACT nasal spray Commonly known as: FLONASE Place 1 spray into both nostrils daily as needed for allergies.   folic acid 1 MG tablet Commonly known as: FOLVITE Take 1 tablet (1 mg total) by mouth daily.   Gerhardt's butt cream Crea Apply 1 Application topically 2 (two) times daily.   hydrOXYzine 25 MG tablet Commonly known as: ATARAX Take 25 mg by mouth 2 (two) times daily as needed for itching.   isosorbide mononitrate 30 MG 24 hr tablet Commonly known as: IMDUR Take 1 tablet (30 mg total) by mouth daily. What changed: how much to take   lidocaine 4 % Place 1 patch onto the skin daily as needed (pain).   methocarbamol 500 MG tablet Commonly known as: ROBAXIN Take 500 mg by mouth every 6 (six) hours as needed for muscle spasms.    Mylanta Coat & Cool 1200-270-80 MG/10ML Susp Generic drug: Cal Carb-Mag Hydrox-Simeth Take 30 mLs by mouth 3 (three) times daily as needed (gas pains).   naloxegol oxalate 12.5 MG Tabs tablet Commonly known as: MOVANTIK Take 1 tablet (12.5 mg total) by mouth daily.   nitroGLYCERIN 0.4 MG SL tablet Commonly known as: NITROSTAT Place 1 tablet (0.4 mg total) under the tongue every 5 (five) minutes as needed for chest pain. What changed: when to take this   ondansetron 4 MG tablet Commonly known as: ZOFRAN Take 4 mg by mouth every 8 (eight) hours as needed for nausea or vomiting.   oxyCODONE 5 MG immediate release tablet Commonly known as: Oxy IR/ROXICODONE Take 1 tablet (5 mg total) by mouth every 6 (six) hours as needed for severe pain.  OXYGEN Inhale 2-3 L/min into the lungs continuous.   oxymetazoline 0.05 % nasal spray Commonly known as: AFRIN Place 1 spray into both nostrils 2 (two) times daily as needed for congestion.   pantoprazole 40 MG tablet Commonly known as: PROTONIX Take 1 tablet (40 mg total) by mouth 2 (two) times daily.   polyethylene glycol powder 17 GM/SCOOP powder Commonly known as: GLYCOLAX/MIRALAX Take 17 g by mouth daily as needed (constipation).   PREPARATION H RAPID RELIEF RE Place 1 spray rectally every 6 (six) hours as needed (hemorrhoids).   rosuvastatin 40 MG tablet Commonly known as: CRESTOR Take 1 tablet (40 mg total) by mouth every evening. What changed:  medication strength how much to take additional instructions   simethicone 80 MG chewable tablet Commonly known as: MYLICON Chew 80 mg by mouth 4 (four) times daily as needed for flatulence.   sucralfate 1 GM/10ML suspension Commonly known as: CARAFATE Take 10 mLs (1 g total) by mouth 4 (four) times daily -  with meals and at bedtime.   ticagrelor 90 MG Tabs tablet Commonly known as: BRILINTA Take 1 tablet (90 mg total) by mouth 2 (two) times daily.        Discharge  Exam: Filed Weights   04/21/22 1748  Weight: 75.7 kg   General: Alert and oriented x 2-3, no acute distress Cardiovascular: Regular rate and rhythm, S1-S2  Condition at discharge: good  The results of significant diagnostics from this hospitalization (including imaging, microbiology, ancillary and laboratory) are listed below for reference.   Imaging Studies: VAS [place] TRANSCRANIAL DOPPLER  Result Date: 04/23/2022  Transcranial Doppler Patient Name:  [name]  Date of Exam:   04/22/2022 Medical Rec #: [id number]          Accession #:    [id number] Date of Birth: 1945/07/04           Patient Gender: F Patient Age:   40 years Exam Location:  [facility] Procedure:      VAS [place] TRANSCRANIAL DOPPLER Referring Phys: Elwin Sleight [place] --------------------------------------------------------------------------------  Indications: Stroke. Limitations for diagnostic windows: Unable to insonate right transtemporal window. Unable to insonate left transtemporal window. Comparison Study: No prior studies. Performing Technologist: Darlin Coco RDMS, RVT  Examination Guidelines: A complete evaluation includes B-mode imaging, spectral Doppler, color Doppler, and power Doppler as needed of all accessible portions of each vessel. Bilateral testing is considered an integral part of a complete examination. Limited examinations for reoccurring indications may be performed as noted.  +----------+---------------+----------+-----------+------------------+ RIGHT TCD Right VM (cm/s)Depth (cm)Pulsatility     Comment       +----------+---------------+----------+-----------+------------------+ MCA                                           Unable to insonate +----------+---------------+----------+-----------+------------------+ ACA                                           Unable to insonate +----------+---------------+----------+-----------+------------------+ Term ICA                                       Unable to insonate +----------+---------------+----------+-----------+------------------+ PCA P1  Unable to insonate +----------+---------------+----------+-----------+------------------+ Opthalmic      17.00                  1.29                       +----------+---------------+----------+-----------+------------------+ ICA siphon                                    Unable to insonate +----------+---------------+----------+-----------+------------------+ Vertebral     -29.00                  0.87                       +----------+---------------+----------+-----------+------------------+  +----------+--------------+----------+-----------+------------------+ LEFT TCD  Left VM (cm/s)Depth (cm)Pulsatility     Comment       +----------+--------------+----------+-----------+------------------+ MCA                                          Unable to insonate +----------+--------------+----------+-----------+------------------+ ACA                                          Unable to insonate +----------+--------------+----------+-----------+------------------+ Term ICA                                     Unable to insonate +----------+--------------+----------+-----------+------------------+ PCA P1                                       Unable to insonate +----------+--------------+----------+-----------+------------------+ Opthalmic     14.00                  1.86                       +----------+--------------+----------+-----------+------------------+ ICA siphon    10.00                  0.77                       +----------+--------------+----------+-----------+------------------+ Vertebral     -19.00                 1.14                       +----------+--------------+----------+-----------+------------------+  +------------+-------+-------+             VM cm/sComment  +------------+-------+-------+ Prox Basilar-18.00         +------------+-------+-------+ Dist Basilar-15.00         +------------+-------+-------+ Summary:  Low normal mean flow velocities in majority of identified vessels of anterior and posterior circulations. absent bitem[poral windows limits evaluation *See table(s) above for TCD measurements and observations.  Diagnosing physician: Antony Contras MD Electronically signed by Antony Contras MD on 04/23/2022 at 11:50:45 AM.    Final    VAS US CAROTID  Result Date: 04/23/2022 Carotid Arterial Duplex Study Patient Name:  [name]  Date of Exam:   04/22/2022 Medical Rec #:  [id number]          Accession #:    [id number] Date of Birth: 19-Jun-1945           Patient Gender: F Patient Age:   19 years Exam Location:  [facility] Procedure:      VAS US CAROTID Referring Phys: Alferd Patee [facility] --------------------------------------------------------------------------------  Indications:       CVA. Risk Factors:      Hypertension, hyperlipidemia, prior CVA. Other Factors:     OSA, morbid obesity, atria fibrillation, cardiomyopathy,                    AICD. Limitations        Today's exam was limited due to the body habitus of the                    patient and tortuosity of vessels. Comparison Study:  09-17-2020 Prior carotid duplex Performing Technologist: Darlin Coco RDMS, RVT  Examination Guidelines: A complete evaluation includes B-mode imaging, spectral Doppler, color Doppler, and power Doppler as needed of all accessible portions of each vessel. Bilateral testing is considered an integral part of a complete examination. Limited examinations for reoccurring indications may be performed as noted.  Right Carotid Findings: +----------+--------+--------+--------+------------------+--------+           PSV cm/sEDV cm/sStenosisPlaque DescriptionComments +----------+--------+--------+--------+------------------+--------+ CCA Prox  78      15                                 tortuous +----------+--------+--------+--------+------------------+--------+ CCA Distal52      9                                          +----------+--------+--------+--------+------------------+--------+ ICA Prox  84      26      1-39%                     tortuous +----------+--------+--------+--------+------------------+--------+ ICA Mid   88      18                                tortuous +----------+--------+--------+--------+------------------+--------+ ICA Distal87      20                                tortuous +----------+--------+--------+--------+------------------+--------+ ECA       137     13                                         +----------+--------+--------+--------+------------------+--------+ +----------+--------+-------+----------------+-------------------+           PSV cm/sEDV cmsDescribe        Arm Pressure (mmHG) +----------+--------+-------+----------------+-------------------+ [id number]            Multiphasic, WNL                    +----------+--------+-------+----------------+-------------------+ +---------+--------+--+--------+-+---------+ VertebralPSV cm/s42EDV cm/s8Antegrade +---------+--------+--+--------+-+---------+  Left Carotid Findings: +----------+--------+--------+--------+------------------+--------+           PSV cm/sEDV cm/sStenosisPlaque DescriptionComments +----------+--------+--------+--------+------------------+--------+ CCA Prox  86      14                                         +----------+--------+--------+--------+------------------+--------+  CCA Distal78      13                                         +----------+--------+--------+--------+------------------+--------+ ICA Prox  124     34      1-39%   heterogenous      tortuous +----------+--------+--------+--------+------------------+--------+ ICA Mid   115     19                                 tortuous +----------+--------+--------+--------+------------------+--------+ ICA Distal78      23                                tortuous +----------+--------+--------+--------+------------------+--------+ ECA       126     16                                         +----------+--------+--------+--------+------------------+--------+ +----------+--------+--------+----------------+-------------------+           PSV cm/sEDV cm/sDescribe        Arm Pressure (mmHG) +----------+--------+--------+----------------+-------------------+ [id number]             Multiphasic, WNL                    +----------+--------+--------+----------------+-------------------+ +---------+--------+--+--------+-+---------+ VertebralPSV cm/s40EDV cm/s8Antegrade +---------+--------+--+--------+-+---------+   Summary: Right Carotid: Velocities in the right ICA are consistent with a 1-39% stenosis. Left Carotid: Velocities in the left ICA are consistent with a 1-39% stenosis. Vertebrals:  Bilateral vertebral arteries demonstrate antegrade flow. Subclavians: Normal flow hemodynamics were seen in bilateral subclavian              arteries. *See table(s) above for measurements and observations.  Electronically signed by Antony Contras MD on 04/23/2022 at 11:49:29 AM.    Final    DG Abd Portable 1V  Result Date: 04/23/2022 CLINICAL DATA:  [id number] [place] ([place]) [id number] EXAM: PORTABLE ABDOMEN - 1 VIEW COMPARISON:  CT 03/17/2022 FINDINGS: No evidence of bowel obstruction. Mild-to-moderate stool burden. Partially visualized thoracic spine fusion hardware and left hip arthroplasty with proximal femur plate and cerclage wire fixation. Multilevel degenerative changes of the spine. Vascular calcifications. Partially visualized pacemaker/AICD leads. IMPRESSION: No evidence of bowel obstruction.  Mild-to-moderate stool burden. Electronically Signed   By: Maurine Simmering M.D.   On: 04/23/2022 11:33   CT HEAD WO CONTRAST  (5MM)  Result Date: 04/22/2022 CLINICAL DATA:  Stroke, follow-up.  Altered mental status. EXAM: CT HEAD WITHOUT CONTRAST TECHNIQUE: Contiguous axial images were obtained from the base of the skull through the vertex without intravenous contrast. RADIATION DOSE REDUCTION: This exam was performed according to the departmental dose-optimization program which includes automated exposure control, adjustment of the mA and/or kV according to patient size and/or use of iterative reconstruction technique. COMPARISON:  Head CT 04/21/2022. FINDINGS: Brain: No acute hemorrhage. Unchanged chronic small-vessel disease. Cortical gray-white differentiation is otherwise preserved. Prominence of the ventricles and sulci within normal limits for age. No extra-axial collection. Basilar cisterns are patent. Vascular: No hyperdense vessel or unexpected calcification. Skull: No calvarial fracture or suspicious bone lesion. Skull base is unremarkable. Sinuses/Orbits: Unremarkable. Other: None. IMPRESSION: 1. No acute intracranial abnormality. 2. Unchanged chronic  small-vessel disease. Electronically Signed   By: Emmit Alexanders M.D.   On: 04/22/2022 14:06   DG Chest Port 1 View  Result Date: 04/21/2022 CLINICAL DATA:  Weakness EXAM: PORTABLE CHEST 1 VIEW COMPARISON:  CXR 03/04/22 FINDINGS: Status post median sternotomy and CABG. Right-sided cardiac device in place with unchanged lead positioning. Thoracic spinal fusion hardware in place. Low lung volumes. Unchanged left-sided pleural effusion. No right-sided pleural effusion. No new focal airspace opacity. No pneumothorax. Unchanged cardiac and mediastinal contours. Visualized upper abdomen is unremarkable. IMPRESSION: No acute abnormality. Electronically Signed   By: Marin Roberts M.D.   On: 04/21/2022 13:45   CT Head Wo Contrast  Result Date: 04/21/2022 CLINICAL DATA:  Altered mental status EXAM: CT HEAD WITHOUT CONTRAST TECHNIQUE: Contiguous axial images were obtained from the  base of the skull through the vertex without intravenous contrast. RADIATION DOSE REDUCTION: This exam was performed according to the departmental dose-optimization program which includes automated exposure control, adjustment of the mA and/or kV according to patient size and/or use of iterative reconstruction technique. COMPARISON:  CT Head 01/26/22 FINDINGS: Brain: No evidence of acute infarction, hemorrhage, hydrocephalus, extra-axial collection or mass lesion/mass effect. Sequela of severe chronic microvascular ischemic change. Vascular: No hyperdense vessel or unexpected calcification. Skull: Normal. Negative for fracture or focal lesion. Sinuses/Orbits: No middle ear or mastoid effusion. Paranasal sinuses are clear. Bilateral lens replacement. Orbits are otherwise unremarkable. Other: None. IMPRESSION: No acute intracranial abnormality. Electronically Signed   By: Marin Roberts M.D.   On: 04/21/2022 13:13    Microbiology: Results for orders placed or performed during the hospital encounter of 04/21/22  Resp panel by RT-PCR (RSV, Flu A&B, Covid) Anterior Nasal Swab     Status: Abnormal   Collection Time: 04/21/22 12:14 PM   Specimen: Anterior Nasal Swab  Result Value Ref Range Status   SARS Coronavirus 2 by RT PCR POSITIVE (A) NEGATIVE Final   Influenza A by PCR NEGATIVE NEGATIVE Final   Influenza B by PCR NEGATIVE NEGATIVE Final    Comment: (NOTE) The Xpert Xpress SARS-CoV-2/FLU/RSV plus assay is intended as an aid in the diagnosis of influenza from Nasopharyngeal swab specimens and should not be used as a sole basis for treatment. Nasal washings and aspirates are unacceptable for Xpert Xpress SARS-CoV-2/FLU/RSV testing.  Fact Sheet for Patients: EntrepreneurPulse.com.au  Fact Sheet for Healthcare Providers: IncredibleEmployment.be  This test is not yet approved or cleared by the [place] FDA and has been authorized for detection and/or diagnosis  of SARS-CoV-2 by FDA under an Emergency Use Authorization (EUA). This EUA will remain in effect (meaning this test can be used) for the duration of the COVID-19 declaration under Section 564(b)(1) of the Act, 21 U.S.C. section 360bbb-3(b)(1), unless the authorization is terminated or revoked.     Resp Syncytial Virus by PCR NEGATIVE NEGATIVE Final    Comment: (NOTE) Fact Sheet for Patients: EntrepreneurPulse.com.au  Fact Sheet for Healthcare Providers: IncredibleEmployment.be  This test is not yet approved or cleared by the [place] FDA and has been authorized for detection and/or diagnosis of SARS-CoV-2 by FDA under an Emergency Use Authorization (EUA). This EUA will remain in effect (meaning this test can be used) for the duration of the COVID-19 declaration under Section 564(b)(1) of the Act, 21 U.S.C. section 360bbb-3(b)(1), unless the authorization is terminated or revoked.  Performed at [facility], [place] [address] [place], [place] [address]    *Note: Due to a large number of results and/or encounters for  the requested time period, some results have not been displayed. A complete set of results can be found in Results Review.    Labs: CBC: Recent Labs  Lab 04/21/22 1214 04/22/22 0251 04/23/22 0245 04/24/22 0313 04/25/22 0412  WBC 6.9 6.1 7.2 7.5 7.1  NEUTROABS 5.2  --   --   --   --   HGB 11.3* 9.6* 10.4* 10.7* 10.3*  HCT 37.5 30.5* 32.7* 35.0* 32.5*  MCV 92.8 90.8 89.6 91.4 88.3  PLT 124* 98* 99* 105* [id number]*   Basic Metabolic Panel: Recent Labs  Lab 04/21/22 1214 04/22/22 0251 04/23/22 0245 04/24/22 0313 04/25/22 0412  NA 135 135 135 135 135  K 5.0 4.8 3.9 3.3* 2.9*  CL 110 111 110 104 107  CO2 18* 19* 20* 19* 21*  GLUCOSE 154* 105* 127* 124* 125*  BUN 28* 27* 24* 30* 34*  CREATININE 2.05* 1.97* 1.95* 1.99* 2.28*  CALCIUM 10.1 9.3 9.1 9.2 8.9  MG  --   --  1.6* 1.6* 1.7   Liver Function Tests: Recent  Labs  Lab 04/21/22 1214 04/22/22 0251  AST 56* 32  ALT 40 31  ALKPHOS 356* 288*  BILITOT 0.5 0.2*  PROT 5.7* 4.9*  ALBUMIN 2.4* 2.0*   CBG: Recent Labs  Lab 04/21/22 1205  GLUCAP 137*    Discharge time spent: less than 30 minutes.  Signed: Annita Brod, MD [facility] 04/25/2022

## 2022-04-27 DIAGNOSIS — U071 COVID-19: Secondary | ICD-10-CM | POA: Diagnosis not present

## 2022-04-27 DIAGNOSIS — R262 Difficulty in walking, not elsewhere classified: Secondary | ICD-10-CM | POA: Diagnosis not present

## 2022-04-27 DIAGNOSIS — R2689 Other abnormalities of gait and mobility: Secondary | ICD-10-CM | POA: Diagnosis not present

## 2022-04-27 DIAGNOSIS — M6281 Muscle weakness (generalized): Secondary | ICD-10-CM | POA: Diagnosis not present

## 2022-04-28 DIAGNOSIS — M6281 Muscle weakness (generalized): Secondary | ICD-10-CM | POA: Diagnosis not present

## 2022-04-28 DIAGNOSIS — R262 Difficulty in walking, not elsewhere classified: Secondary | ICD-10-CM | POA: Diagnosis not present

## 2022-04-28 DIAGNOSIS — R2689 Other abnormalities of gait and mobility: Secondary | ICD-10-CM | POA: Diagnosis not present

## 2022-04-28 DIAGNOSIS — U071 COVID-19: Secondary | ICD-10-CM | POA: Diagnosis not present

## 2022-04-28 IMAGING — DX DG CHEST 1V
1 series · 1 of 1 positions shown · non-contrast
Comparison: Radiograph 04/23/2021

CLINICAL DATA: Missed dialysis

EXAM:
CHEST  1 VIEW

[chest ap]
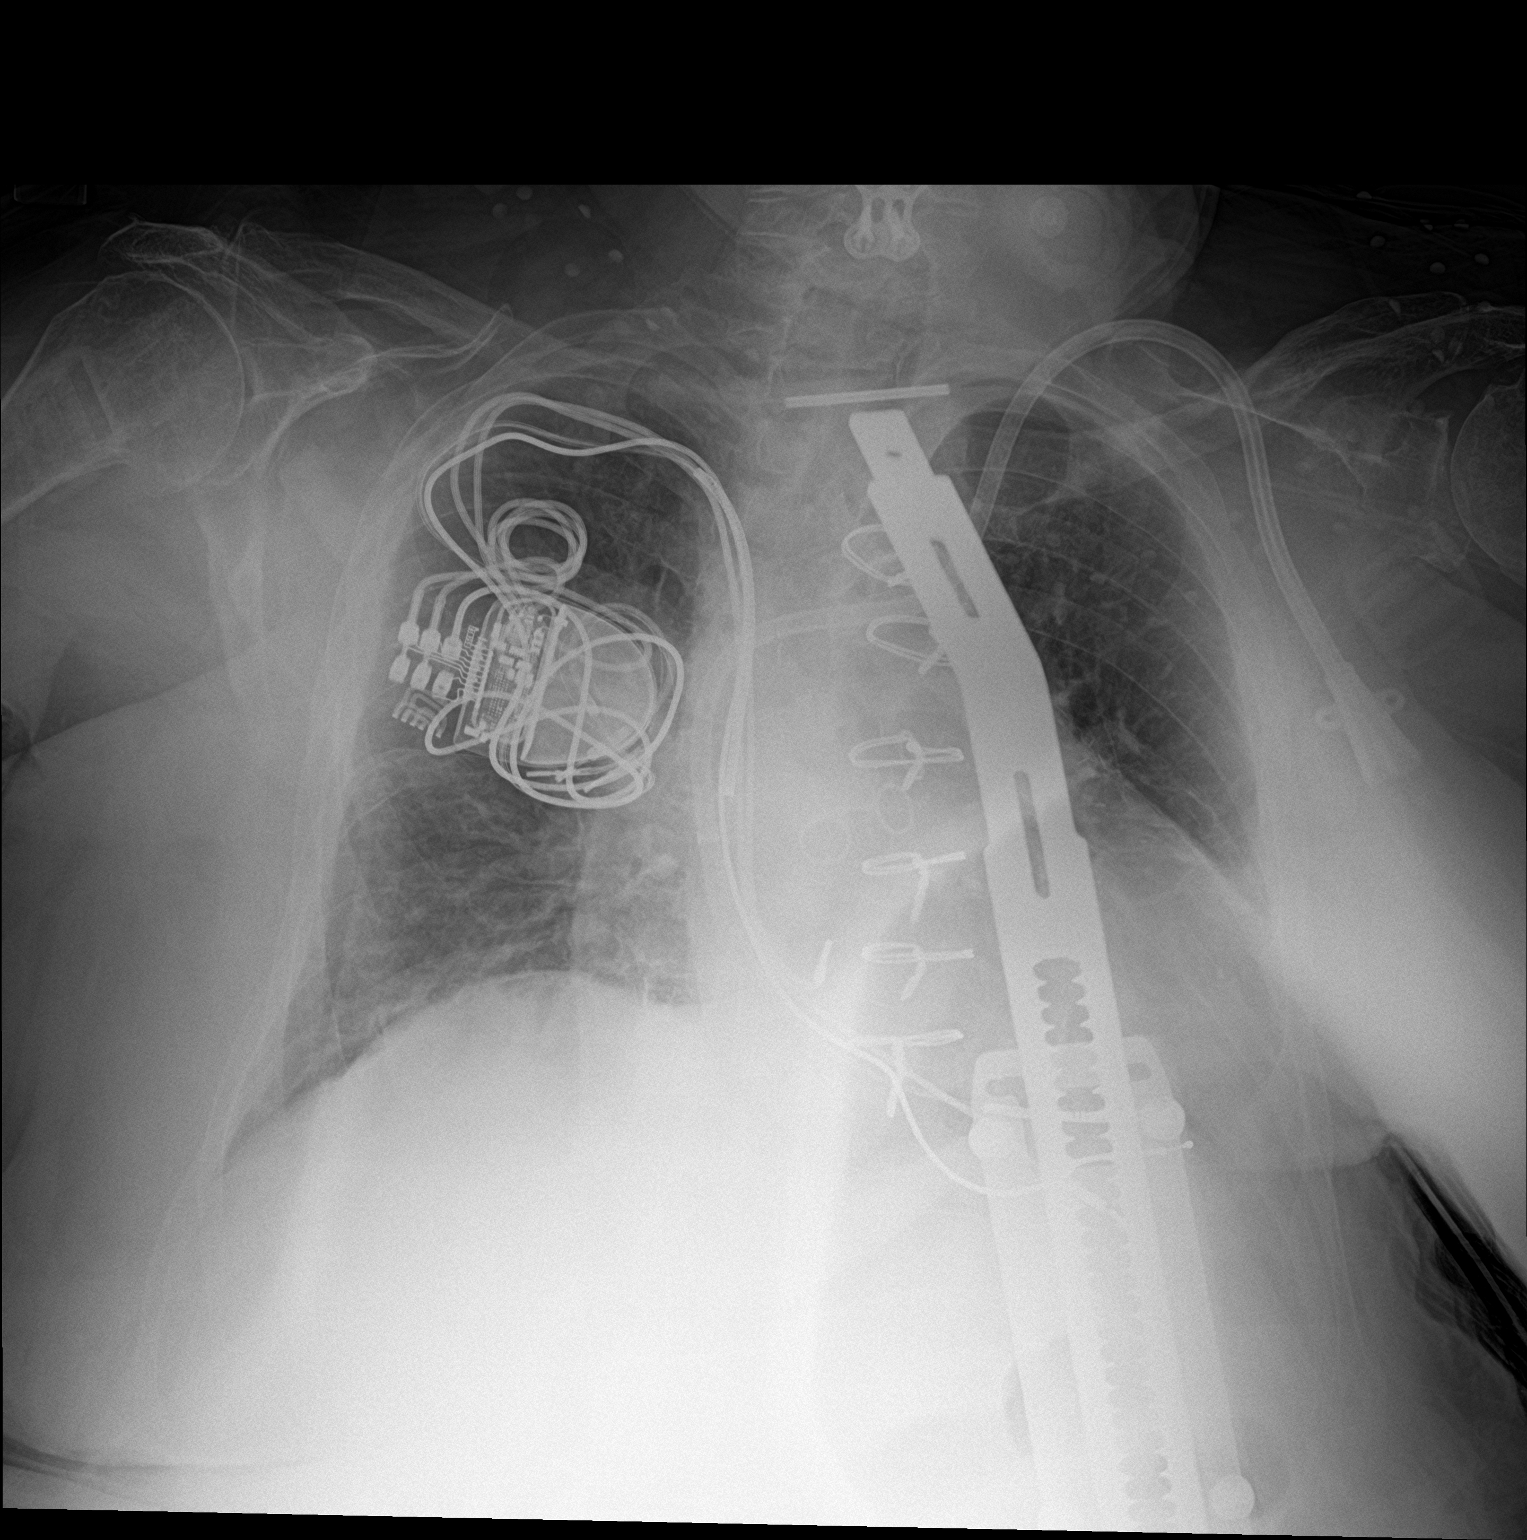

[1 of 1 positions shown; findings below may reference images not displayed]

FINDINGS: Large metallic object overlies the left chest. Left neck approach
central venous catheter tip overlies the right atrium. Unchanged
pacemaker/AICD leads. Unchanged cardiomediastinal silhouette with
prior median sternotomy and postsurgical changes of CABG. Bibasilar
band like opacities, likely atelectasis. Mild interstitial
prominence throughout the right lung. Shoulder osteoarthritis.
Thoracic spondylosis. No acute osseous abnormality.
IMPRESSION: Mild interstitial prominence throughout the right lung, possibly
asymmetric interstitial edema.

Bibasilar atelectasis. Large metallic object overlies the left
chest.

## 2022-04-29 DIAGNOSIS — R2689 Other abnormalities of gait and mobility: Secondary | ICD-10-CM | POA: Diagnosis not present

## 2022-04-29 DIAGNOSIS — M6281 Muscle weakness (generalized): Secondary | ICD-10-CM | POA: Diagnosis not present

## 2022-04-29 DIAGNOSIS — R262 Difficulty in walking, not elsewhere classified: Secondary | ICD-10-CM | POA: Diagnosis not present

## 2022-04-29 DIAGNOSIS — U071 COVID-19: Secondary | ICD-10-CM | POA: Diagnosis not present

## 2022-04-30 DIAGNOSIS — R2689 Other abnormalities of gait and mobility: Secondary | ICD-10-CM | POA: Diagnosis not present

## 2022-04-30 DIAGNOSIS — M6281 Muscle weakness (generalized): Secondary | ICD-10-CM | POA: Diagnosis not present

## 2022-04-30 DIAGNOSIS — U071 COVID-19: Secondary | ICD-10-CM | POA: Diagnosis not present

## 2022-04-30 DIAGNOSIS — E119 Type 2 diabetes mellitus without complications: Secondary | ICD-10-CM | POA: Diagnosis not present

## 2022-04-30 DIAGNOSIS — R262 Difficulty in walking, not elsewhere classified: Secondary | ICD-10-CM | POA: Diagnosis not present

## 2022-05-01 DIAGNOSIS — M6281 Muscle weakness (generalized): Secondary | ICD-10-CM | POA: Diagnosis not present

## 2022-05-01 DIAGNOSIS — R262 Difficulty in walking, not elsewhere classified: Secondary | ICD-10-CM | POA: Diagnosis not present

## 2022-05-01 DIAGNOSIS — R2689 Other abnormalities of gait and mobility: Secondary | ICD-10-CM | POA: Diagnosis not present

## 2022-05-01 DIAGNOSIS — U071 COVID-19: Secondary | ICD-10-CM | POA: Diagnosis not present

## 2022-05-02 DIAGNOSIS — R2689 Other abnormalities of gait and mobility: Secondary | ICD-10-CM | POA: Diagnosis not present

## 2022-05-02 DIAGNOSIS — U071 COVID-19: Secondary | ICD-10-CM | POA: Diagnosis not present

## 2022-05-02 DIAGNOSIS — M6281 Muscle weakness (generalized): Secondary | ICD-10-CM | POA: Diagnosis not present

## 2022-05-02 DIAGNOSIS — R262 Difficulty in walking, not elsewhere classified: Secondary | ICD-10-CM | POA: Diagnosis not present

## 2022-05-05 DIAGNOSIS — R262 Difficulty in walking, not elsewhere classified: Secondary | ICD-10-CM | POA: Diagnosis not present

## 2022-05-05 DIAGNOSIS — U071 COVID-19: Secondary | ICD-10-CM | POA: Diagnosis not present

## 2022-05-05 DIAGNOSIS — M6281 Muscle weakness (generalized): Secondary | ICD-10-CM | POA: Diagnosis not present

## 2022-05-05 DIAGNOSIS — R2689 Other abnormalities of gait and mobility: Secondary | ICD-10-CM | POA: Diagnosis not present

## 2022-05-06 DIAGNOSIS — U071 COVID-19: Secondary | ICD-10-CM | POA: Diagnosis not present

## 2022-05-06 DIAGNOSIS — R2689 Other abnormalities of gait and mobility: Secondary | ICD-10-CM | POA: Diagnosis not present

## 2022-05-06 DIAGNOSIS — R262 Difficulty in walking, not elsewhere classified: Secondary | ICD-10-CM | POA: Diagnosis not present

## 2022-05-06 DIAGNOSIS — M6281 Muscle weakness (generalized): Secondary | ICD-10-CM | POA: Diagnosis not present

## 2022-05-07 DIAGNOSIS — J9612 Chronic respiratory failure with hypercapnia: Secondary | ICD-10-CM | POA: Diagnosis not present

## 2022-05-07 DIAGNOSIS — M19072 Primary osteoarthritis, left ankle and foot: Secondary | ICD-10-CM | POA: Diagnosis not present

## 2022-05-07 DIAGNOSIS — N1832 Chronic kidney disease, stage 3b: Secondary | ICD-10-CM | POA: Diagnosis not present

## 2022-05-07 DIAGNOSIS — R262 Difficulty in walking, not elsewhere classified: Secondary | ICD-10-CM | POA: Diagnosis not present

## 2022-05-07 DIAGNOSIS — M6281 Muscle weakness (generalized): Secondary | ICD-10-CM | POA: Diagnosis not present

## 2022-05-07 DIAGNOSIS — I5022 Chronic systolic (congestive) heart failure: Secondary | ICD-10-CM | POA: Diagnosis not present

## 2022-05-07 DIAGNOSIS — J9621 Acute and chronic respiratory failure with hypoxia: Secondary | ICD-10-CM | POA: Diagnosis not present

## 2022-05-07 DIAGNOSIS — J9611 Chronic respiratory failure with hypoxia: Secondary | ICD-10-CM | POA: Diagnosis not present

## 2022-05-07 DIAGNOSIS — R2689 Other abnormalities of gait and mobility: Secondary | ICD-10-CM | POA: Diagnosis not present

## 2022-05-07 DIAGNOSIS — U071 COVID-19: Secondary | ICD-10-CM | POA: Diagnosis not present

## 2022-05-08 ENCOUNTER — Inpatient Hospital Stay (HOSPITAL_COMMUNITY): Admission: RE | Admit: 2022-05-08 | Payer: 59 | Source: Ambulatory Visit

## 2022-05-08 ENCOUNTER — Encounter (HOSPITAL_COMMUNITY): Payer: 59

## 2022-05-08 DIAGNOSIS — R2689 Other abnormalities of gait and mobility: Secondary | ICD-10-CM | POA: Diagnosis not present

## 2022-05-08 DIAGNOSIS — M6281 Muscle weakness (generalized): Secondary | ICD-10-CM | POA: Diagnosis not present

## 2022-05-08 DIAGNOSIS — U071 COVID-19: Secondary | ICD-10-CM | POA: Diagnosis not present

## 2022-05-08 DIAGNOSIS — D649 Anemia, unspecified: Secondary | ICD-10-CM | POA: Diagnosis not present

## 2022-05-08 DIAGNOSIS — R262 Difficulty in walking, not elsewhere classified: Secondary | ICD-10-CM | POA: Diagnosis not present

## 2022-05-08 DIAGNOSIS — I5033 Acute on chronic diastolic (congestive) heart failure: Secondary | ICD-10-CM | POA: Diagnosis not present

## 2022-05-09 ENCOUNTER — Ambulatory Visit (INDEPENDENT_AMBULATORY_CARE_PROVIDER_SITE_OTHER): Payer: 59

## 2022-05-09 DIAGNOSIS — I442 Atrioventricular block, complete: Secondary | ICD-10-CM | POA: Diagnosis not present

## 2022-05-09 DIAGNOSIS — Z0000 Encounter for general adult medical examination without abnormal findings: Secondary | ICD-10-CM | POA: Diagnosis not present

## 2022-05-11 ENCOUNTER — Emergency Department (HOSPITAL_COMMUNITY): Payer: 59

## 2022-05-11 ENCOUNTER — Inpatient Hospital Stay (HOSPITAL_COMMUNITY): Payer: 59

## 2022-05-11 ENCOUNTER — Inpatient Hospital Stay (HOSPITAL_COMMUNITY)
Admission: EM | Admit: 2022-05-11 | Discharge: 2022-05-16 | DRG: 683 | Disposition: A | Discharge status: Skilled Nursing Facility | Payer: 59 | Attending: Internal Medicine | Admitting: Internal Medicine

## 2022-05-11 ENCOUNTER — Other Ambulatory Visit: Payer: Self-pay

## 2022-05-11 DIAGNOSIS — R0602 Shortness of breath: Secondary | ICD-10-CM | POA: Diagnosis not present

## 2022-05-11 DIAGNOSIS — E782 Mixed hyperlipidemia: Secondary | ICD-10-CM | POA: Diagnosis present

## 2022-05-11 DIAGNOSIS — Z79899 Other long term (current) drug therapy: Secondary | ICD-10-CM | POA: Diagnosis not present

## 2022-05-11 DIAGNOSIS — W228XXA Striking against or struck by other objects, initial encounter: Secondary | ICD-10-CM | POA: Diagnosis present

## 2022-05-11 DIAGNOSIS — Z91041 Radiographic dye allergy status: Secondary | ICD-10-CM

## 2022-05-11 DIAGNOSIS — M109 Gout, unspecified: Secondary | ICD-10-CM | POA: Diagnosis present

## 2022-05-11 DIAGNOSIS — E876 Hypokalemia: Secondary | ICD-10-CM | POA: Diagnosis not present

## 2022-05-11 DIAGNOSIS — G8929 Other chronic pain: Secondary | ICD-10-CM | POA: Diagnosis not present

## 2022-05-11 DIAGNOSIS — K869 Disease of pancreas, unspecified: Secondary | ICD-10-CM | POA: Diagnosis not present

## 2022-05-11 DIAGNOSIS — E1122 Type 2 diabetes mellitus with diabetic chronic kidney disease: Secondary | ICD-10-CM | POA: Diagnosis not present

## 2022-05-11 DIAGNOSIS — Z955 Presence of coronary angioplasty implant and graft: Secondary | ICD-10-CM

## 2022-05-11 DIAGNOSIS — Z96642 Presence of left artificial hip joint: Secondary | ICD-10-CM | POA: Diagnosis not present

## 2022-05-11 DIAGNOSIS — N179 Acute kidney failure, unspecified: Secondary | ICD-10-CM | POA: Diagnosis present

## 2022-05-11 DIAGNOSIS — S91115A Laceration without foreign body of left lesser toe(s) without damage to nail, initial encounter: Secondary | ICD-10-CM | POA: Diagnosis present

## 2022-05-11 DIAGNOSIS — N184 Chronic kidney disease, stage 4 (severe): Secondary | ICD-10-CM | POA: Diagnosis not present

## 2022-05-11 DIAGNOSIS — Z803 Family history of malignant neoplasm of breast: Secondary | ICD-10-CM

## 2022-05-11 DIAGNOSIS — S91302A Unspecified open wound, left foot, initial encounter: Secondary | ICD-10-CM | POA: Diagnosis not present

## 2022-05-11 DIAGNOSIS — I442 Atrioventricular block, complete: Secondary | ICD-10-CM | POA: Diagnosis not present

## 2022-05-11 DIAGNOSIS — I10 Essential (primary) hypertension: Secondary | ICD-10-CM | POA: Diagnosis present

## 2022-05-11 DIAGNOSIS — Z83511 Family history of glaucoma: Secondary | ICD-10-CM

## 2022-05-11 DIAGNOSIS — Z95810 Presence of automatic (implantable) cardiac defibrillator: Secondary | ICD-10-CM

## 2022-05-11 DIAGNOSIS — Z7982 Long term (current) use of aspirin: Secondary | ICD-10-CM

## 2022-05-11 DIAGNOSIS — Z91013 Allergy to seafood: Secondary | ICD-10-CM

## 2022-05-11 DIAGNOSIS — K769 Liver disease, unspecified: Secondary | ICD-10-CM | POA: Diagnosis not present

## 2022-05-11 DIAGNOSIS — Z881 Allergy status to other antibiotic agents status: Secondary | ICD-10-CM

## 2022-05-11 DIAGNOSIS — R2689 Other abnormalities of gait and mobility: Secondary | ICD-10-CM | POA: Diagnosis not present

## 2022-05-11 DIAGNOSIS — E781 Pure hyperglyceridemia: Secondary | ICD-10-CM | POA: Diagnosis present

## 2022-05-11 DIAGNOSIS — I5042 Chronic combined systolic (congestive) and diastolic (congestive) heart failure: Secondary | ICD-10-CM | POA: Diagnosis not present

## 2022-05-11 DIAGNOSIS — I428 Other cardiomyopathies: Secondary | ICD-10-CM | POA: Diagnosis not present

## 2022-05-11 DIAGNOSIS — D696 Thrombocytopenia, unspecified: Secondary | ICD-10-CM | POA: Diagnosis present

## 2022-05-11 DIAGNOSIS — Z743 Need for continuous supervision: Secondary | ICD-10-CM | POA: Diagnosis not present

## 2022-05-11 DIAGNOSIS — L89311 Pressure ulcer of right buttock, stage 1: Secondary | ICD-10-CM | POA: Diagnosis present

## 2022-05-11 DIAGNOSIS — Z7902 Long term (current) use of antithrombotics/antiplatelets: Secondary | ICD-10-CM

## 2022-05-11 DIAGNOSIS — N281 Cyst of kidney, acquired: Secondary | ICD-10-CM | POA: Diagnosis present

## 2022-05-11 DIAGNOSIS — Z888 Allergy status to other drugs, medicaments and biological substances status: Secondary | ICD-10-CM

## 2022-05-11 DIAGNOSIS — K8689 Other specified diseases of pancreas: Secondary | ICD-10-CM

## 2022-05-11 DIAGNOSIS — Z951 Presence of aortocoronary bypass graft: Secondary | ICD-10-CM

## 2022-05-11 DIAGNOSIS — I130 Hypertensive heart and chronic kidney disease with heart failure and stage 1 through stage 4 chronic kidney disease, or unspecified chronic kidney disease: Secondary | ICD-10-CM | POA: Diagnosis not present

## 2022-05-11 DIAGNOSIS — J45909 Unspecified asthma, uncomplicated: Secondary | ICD-10-CM | POA: Diagnosis present

## 2022-05-11 DIAGNOSIS — R627 Adult failure to thrive: Secondary | ICD-10-CM | POA: Diagnosis present

## 2022-05-11 DIAGNOSIS — N3001 Acute cystitis with hematuria: Secondary | ICD-10-CM | POA: Diagnosis not present

## 2022-05-11 DIAGNOSIS — Z6827 Body mass index (BMI) 27.0-27.9, adult: Secondary | ICD-10-CM

## 2022-05-11 DIAGNOSIS — K219 Gastro-esophageal reflux disease without esophagitis: Secondary | ICD-10-CM | POA: Diagnosis present

## 2022-05-11 DIAGNOSIS — I480 Paroxysmal atrial fibrillation: Secondary | ICD-10-CM | POA: Insufficient documentation

## 2022-05-11 DIAGNOSIS — Z87891 Personal history of nicotine dependence: Secondary | ICD-10-CM

## 2022-05-11 DIAGNOSIS — Z825 Family history of asthma and other chronic lower respiratory diseases: Secondary | ICD-10-CM

## 2022-05-11 DIAGNOSIS — Z8673 Personal history of transient ischemic attack (TIA), and cerebral infarction without residual deficits: Secondary | ICD-10-CM

## 2022-05-11 DIAGNOSIS — J9611 Chronic respiratory failure with hypoxia: Secondary | ICD-10-CM | POA: Diagnosis present

## 2022-05-11 DIAGNOSIS — G4733 Obstructive sleep apnea (adult) (pediatric): Secondary | ICD-10-CM | POA: Diagnosis not present

## 2022-05-11 DIAGNOSIS — Z882 Allergy status to sulfonamides status: Secondary | ICD-10-CM

## 2022-05-11 DIAGNOSIS — E669 Obesity, unspecified: Secondary | ICD-10-CM | POA: Diagnosis present

## 2022-05-11 DIAGNOSIS — E440 Moderate protein-calorie malnutrition: Secondary | ICD-10-CM | POA: Diagnosis not present

## 2022-05-11 DIAGNOSIS — Z9981 Dependence on supplemental oxygen: Secondary | ICD-10-CM

## 2022-05-11 DIAGNOSIS — E7800 Pure hypercholesterolemia, unspecified: Secondary | ICD-10-CM | POA: Diagnosis present

## 2022-05-11 DIAGNOSIS — Z7951 Long term (current) use of inhaled steroids: Secondary | ICD-10-CM

## 2022-05-11 DIAGNOSIS — G4730 Sleep apnea, unspecified: Secondary | ICD-10-CM | POA: Diagnosis present

## 2022-05-11 DIAGNOSIS — K760 Fatty (change of) liver, not elsewhere classified: Secondary | ICD-10-CM | POA: Diagnosis present

## 2022-05-11 DIAGNOSIS — E11621 Type 2 diabetes mellitus with foot ulcer: Secondary | ICD-10-CM

## 2022-05-11 DIAGNOSIS — R1312 Dysphagia, oropharyngeal phase: Secondary | ICD-10-CM | POA: Diagnosis not present

## 2022-05-11 DIAGNOSIS — Z833 Family history of diabetes mellitus: Secondary | ICD-10-CM

## 2022-05-11 DIAGNOSIS — E119 Type 2 diabetes mellitus without complications: Secondary | ICD-10-CM | POA: Diagnosis not present

## 2022-05-11 DIAGNOSIS — Z841 Family history of disorders of kidney and ureter: Secondary | ICD-10-CM

## 2022-05-11 DIAGNOSIS — B964 Proteus (mirabilis) (morganii) as the cause of diseases classified elsewhere: Secondary | ICD-10-CM | POA: Diagnosis present

## 2022-05-11 DIAGNOSIS — N3000 Acute cystitis without hematuria: Secondary | ICD-10-CM | POA: Diagnosis not present

## 2022-05-11 DIAGNOSIS — I2510 Atherosclerotic heart disease of native coronary artery without angina pectoris: Secondary | ICD-10-CM | POA: Diagnosis not present

## 2022-05-11 DIAGNOSIS — N390 Urinary tract infection, site not specified: Secondary | ICD-10-CM

## 2022-05-11 DIAGNOSIS — K7689 Other specified diseases of liver: Secondary | ICD-10-CM | POA: Diagnosis not present

## 2022-05-11 DIAGNOSIS — S99929A Unspecified injury of unspecified foot, initial encounter: Secondary | ICD-10-CM | POA: Diagnosis not present

## 2022-05-11 DIAGNOSIS — Z7401 Bed confinement status: Secondary | ICD-10-CM | POA: Diagnosis not present

## 2022-05-11 DIAGNOSIS — Z8249 Family history of ischemic heart disease and other diseases of the circulatory system: Secondary | ICD-10-CM

## 2022-05-11 DIAGNOSIS — M6281 Muscle weakness (generalized): Secondary | ICD-10-CM | POA: Diagnosis not present

## 2022-05-11 LAB — CBC WITH DIFFERENTIAL/PLATELET
Abs Immature Granulocytes: 0.05 10*3/uL (ref 0.00–0.07)
Basophils Absolute: 0 10*3/uL (ref 0.0–0.1)
Basophils Relative: 0 %
Eosinophils Absolute: 0.1 10*3/uL (ref 0.0–0.5)
Eosinophils Relative: 1 %
HCT: 36.7 % (ref 36.0–46.0)
Hemoglobin: 11.5 g/dL — ABNORMAL LOW (ref 12.0–15.0)
Immature Granulocytes: 1 %
Lymphocytes Relative: 12 %
Lymphs Abs: 1.3 10*3/uL (ref 0.7–4.0)
MCH: 28 pg (ref 26.0–34.0)
MCHC: 31.3 g/dL (ref 30.0–36.0)
MCV: 89.5 fL (ref 80.0–100.0)
Monocytes Absolute: 0.6 10*3/uL (ref 0.1–1.0)
Monocytes Relative: 5 %
Neutro Abs: 9 10*3/uL — ABNORMAL HIGH (ref 1.7–7.7)
Neutrophils Relative %: 81 %
Platelets: 93 10*3/uL — ABNORMAL LOW (ref 150–400)
RBC: 4.1 MIL/uL (ref 3.87–5.11)
RDW: 19 % — ABNORMAL HIGH (ref 11.5–15.5)
WBC: 11 10*3/uL — ABNORMAL HIGH (ref 4.0–10.5)
nRBC: 0 % (ref 0.0–0.2)

## 2022-05-11 LAB — URINALYSIS, ROUTINE W REFLEX MICROSCOPIC
Bilirubin Urine: NEGATIVE
Glucose, UA: NEGATIVE mg/dL
Ketones, ur: NEGATIVE mg/dL
Nitrite: NEGATIVE
Protein, ur: 100 mg/dL — AB
Specific Gravity, Urine: 1.016 (ref 1.005–1.030)
WBC, UA: >50 WBC/hpf (ref 0–5)
pH: 6 (ref 5.0–8.0)

## 2022-05-11 LAB — SODIUM, URINE, RANDOM: Sodium, Ur: 15 mmol/L

## 2022-05-11 LAB — BASIC METABOLIC PANEL
Anion gap: 11 (ref 5–15)
BUN: 60 mg/dL — ABNORMAL HIGH (ref 8–23)
CO2: 23 mmol/L (ref 22–32)
Calcium: 8.8 mg/dL — ABNORMAL LOW (ref 8.9–10.3)
Chloride: 102 mmol/L (ref 98–111)
Creatinine, Ser: 3.61 mg/dL — ABNORMAL HIGH (ref 0.44–1.00)
GFR, Estimated: 12 mL/min — ABNORMAL LOW (ref >60)
Glucose, Bld: 128 mg/dL — ABNORMAL HIGH (ref 70–99)
Potassium: 2.1 mmol/L — CL (ref 3.5–5.1)
Sodium: 136 mmol/L (ref 135–145)

## 2022-05-11 LAB — GLUCOSE, CAPILLARY: Glucose-Capillary: 144 mg/dL — ABNORMAL HIGH (ref 70–99)

## 2022-05-11 LAB — HEPATIC FUNCTION PANEL
ALT: 24 U/L (ref 0–44)
AST: 41 U/L (ref 15–41)
Albumin: 2.4 g/dL — ABNORMAL LOW (ref 3.5–5.0)
Alkaline Phosphatase: 455 U/L — ABNORMAL HIGH (ref 38–126)
Bilirubin, Direct: 0.3 mg/dL — ABNORMAL HIGH (ref 0.0–0.2)
Indirect Bilirubin: 0.7 mg/dL (ref 0.3–0.9)
Total Bilirubin: 1 mg/dL (ref 0.3–1.2)
Total Protein: 6.3 g/dL — ABNORMAL LOW (ref 6.5–8.1)

## 2022-05-11 LAB — CREATININE, URINE, RANDOM: Creatinine, Urine: 84 mg/dL

## 2022-05-11 LAB — LACTIC ACID, PLASMA: Lactic Acid, Venous: 1.3 mmol/L (ref 0.5–1.9)

## 2022-05-11 LAB — LIPASE, BLOOD: Lipase: 73 U/L — ABNORMAL HIGH (ref 11–51)

## 2022-05-11 LAB — MAGNESIUM: Magnesium: 2.2 mg/dL (ref 1.7–2.4)

## 2022-05-11 MED ORDER — ISOSORBIDE MONONITRATE ER 30 MG PO TB24
30.0000 mg | ORAL_TABLET | Freq: Every day | ORAL | Status: DC
  Administered 2022-05-12 – 2022-05-16 (×5): 30 mg via ORAL
  Filled 2022-05-11 (×6): qty 1

## 2022-05-11 MED ORDER — ACETAMINOPHEN 325 MG PO TABS
650.0000 mg | ORAL_TABLET | Freq: Four times a day (QID) | ORAL | Status: DC | PRN
  Administered 2022-05-11 – 2022-05-16 (×6): 650 mg via ORAL
  Filled 2022-05-11 (×6): qty 2

## 2022-05-11 MED ORDER — SODIUM CHLORIDE 0.9% FLUSH
3.0000 mL | Freq: Two times a day (BID) | INTRAVENOUS | Status: DC
  Administered 2022-05-12 – 2022-05-16 (×6): 3 mL via INTRAVENOUS

## 2022-05-11 MED ORDER — HYDROXYZINE HCL 25 MG PO TABS: 25.0000 mg | ORAL_TABLET | Freq: Two times a day (BID) | ORAL | Status: DC | PRN

## 2022-05-11 MED ORDER — POTASSIUM CHLORIDE 20 MEQ PO PACK
60.0000 meq | PACK | Freq: Once | ORAL | Status: AC
  Administered 2022-05-11: 60 meq via ORAL
  Filled 2022-05-11: qty 3

## 2022-05-11 MED ORDER — SODIUM CHLORIDE 0.9 % IV SOLN
1.0000 g | INTRAVENOUS | Status: DC
  Administered 2022-05-12 – 2022-05-13 (×2): 1 g via INTRAVENOUS
  Filled 2022-05-11 (×2): qty 10

## 2022-05-11 MED ORDER — ACETAMINOPHEN 650 MG RE SUPP: 650.0000 mg | Freq: Four times a day (QID) | RECTAL | Status: DC | PRN

## 2022-05-11 MED ORDER — MAGNESIUM OXIDE -MG SUPPLEMENT 400 (240 MG) MG PO TABS
800.0000 mg | ORAL_TABLET | Freq: Once | ORAL | Status: AC
  Administered 2022-05-11: 800 mg via ORAL
  Filled 2022-05-11: qty 2

## 2022-05-11 MED ORDER — MOMETASONE FURO-FORMOTEROL FUM 200-5 MCG/ACT IN AERO
2.0000 | INHALATION_SPRAY | Freq: Two times a day (BID) | RESPIRATORY_TRACT | Status: DC
  Administered 2022-05-12 – 2022-05-16 (×9): 2 via RESPIRATORY_TRACT
  Filled 2022-05-11: qty 8.8

## 2022-05-11 MED ORDER — ONDANSETRON HCL 4 MG PO TABS
4.0000 mg | ORAL_TABLET | Freq: Four times a day (QID) | ORAL | Status: DC | PRN
  Administered 2022-05-15: 4 mg via ORAL
  Filled 2022-05-11: qty 1

## 2022-05-11 MED ORDER — POTASSIUM CHLORIDE CRYS ER 20 MEQ PO TBCR
40.0000 meq | EXTENDED_RELEASE_TABLET | Freq: Once | ORAL | Status: AC
  Administered 2022-05-11: 40 meq via ORAL
  Filled 2022-05-11: qty 2

## 2022-05-11 MED ORDER — SENNOSIDES-DOCUSATE SODIUM 8.6-50 MG PO TABS: 1.0000 | ORAL_TABLET | Freq: Every evening | ORAL | Status: DC | PRN

## 2022-05-11 MED ORDER — ONDANSETRON HCL 4 MG/2ML IJ SOLN
4.0000 mg | Freq: Four times a day (QID) | INTRAMUSCULAR | Status: DC | PRN
  Administered 2022-05-12 – 2022-05-16 (×4): 4 mg via INTRAVENOUS
  Filled 2022-05-11 (×4): qty 2

## 2022-05-11 MED ORDER — ROSUVASTATIN CALCIUM 20 MG PO TABS
10.0000 mg | ORAL_TABLET | Freq: Every evening | ORAL | Status: DC
  Administered 2022-05-12 – 2022-05-15 (×4): 10 mg via ORAL
  Filled 2022-05-11 (×4): qty 1

## 2022-05-11 MED ORDER — SODIUM CHLORIDE 0.9 % IV SOLN
2.0000 g | Freq: Once | INTRAVENOUS | Status: AC
  Administered 2022-05-11: 2 g via INTRAVENOUS
  Filled 2022-05-11: qty 20

## 2022-05-11 MED ORDER — METHOCARBAMOL 500 MG PO TABS
500.0000 mg | ORAL_TABLET | Freq: Four times a day (QID) | ORAL | Status: DC | PRN
  Administered 2022-05-12 – 2022-05-16 (×7): 500 mg via ORAL
  Filled 2022-05-11 (×7): qty 1

## 2022-05-11 MED ORDER — LACTATED RINGERS IV BOLUS
1000.0000 mL | Freq: Once | INTRAVENOUS | Status: AC
  Administered 2022-05-11: 1000 mL via INTRAVENOUS

## 2022-05-11 MED ORDER — POTASSIUM CHLORIDE 10 MEQ/100ML IV SOLN
10.0000 meq | Freq: Once | INTRAVENOUS | Status: AC
  Administered 2022-05-11: 10 meq via INTRAVENOUS
  Filled 2022-05-11: qty 100

## 2022-05-11 MED ORDER — ASPIRIN 81 MG PO TBEC
81.0000 mg | DELAYED_RELEASE_TABLET | Freq: Every day | ORAL | Status: DC
  Administered 2022-05-12 – 2022-05-16 (×5): 81 mg via ORAL
  Filled 2022-05-11 (×5): qty 1

## 2022-05-11 MED ORDER — INSULIN ASPART 100 UNIT/ML IJ SOLN
0.0000 [IU] | Freq: Three times a day (TID) | INTRAMUSCULAR | Status: DC
  Administered 2022-05-12 (×2): 2 [IU] via SUBCUTANEOUS
  Administered 2022-05-13: 3 [IU] via SUBCUTANEOUS
  Administered 2022-05-13 – 2022-05-14 (×2): 2 [IU] via SUBCUTANEOUS
  Administered 2022-05-14 (×2): 1 [IU] via SUBCUTANEOUS
  Administered 2022-05-15: 2 [IU] via SUBCUTANEOUS
  Administered 2022-05-15 – 2022-05-16 (×4): 1 [IU] via SUBCUTANEOUS

## 2022-05-11 MED ORDER — ALBUTEROL SULFATE (2.5 MG/3ML) 0.083% IN NEBU: 2.5000 mg | INHALATION_SOLUTION | Freq: Four times a day (QID) | RESPIRATORY_TRACT | Status: DC | PRN

## 2022-05-11 MED ORDER — PANTOPRAZOLE SODIUM 40 MG PO TBEC
40.0000 mg | DELAYED_RELEASE_TABLET | Freq: Two times a day (BID) | ORAL | Status: DC
  Administered 2022-05-12 – 2022-05-16 (×9): 40 mg via ORAL
  Filled 2022-05-11 (×9): qty 1

## 2022-05-11 NOTE — ED Notes (Signed)
Given snack, pending admitting MD, bed assignment

## 2022-05-11 NOTE — ED Notes (Signed)
EDP into room, at BS.  ?

## 2022-05-11 NOTE — ED Notes (Signed)
Pt alert, NAD, calm, interactive, resps e/u, baseline 3L O2 reapplied. Mentions bottom hurts. Apparent acute wound noted to toes with no known injury and w/o pain. Mentions some intermittent dysuria and frequency.

## 2022-05-11 NOTE — ED Notes (Signed)
Back from CT. Verbalizes hunger and thirst. Returned to monitor. VSS.

## 2022-05-11 NOTE — Hospital Course (Signed)
[name] is a 77 y.o. female with medical history significant for chronic combined systolic and diastolic CHF (EF [id number] %), CHB s/p CRT-D, CKD stage IV, CAD s/p CABG, PAF not on anticoagulation, chronic respiratory failure with hypoxia on 3 L [place], asthma, T2DM, HTN, HLD, chronic thrombocytopenia, OSA intolerant to CPAP who is admitted with AKI on CKD stage IV.

## 2022-05-11 NOTE — ED Provider Notes (Signed)
[facility] 4TH FLOOR PROGRESSIVE CARE AND UROLOGY Provider Note  CSN: [id number] Arrival date & time: 05/11/22 1518  Chief Complaint(s) No chief complaint on file.  HPI [name] is a 77 y.o. female with PMH HTN, HLD, CHF, A-fib, CAD status post PCI, complete heart block status post pacemaker, chronic respiratory failure on 2 L home O2, T2DM, OSA who presents emergency room for evaluation of failure to thrive, toe wound.  Patient had been admitted to the hospital and discharged on 04/25/2022 after concern for expressive aphasia and possible stroke.  They were unable to fully evaluate this given her pacemaker being MRI compatible and severe contrast allergy limiting ability to do CT angiography.  Symptoms appear to have ultimately improved and patient was ultimately discharged back to her facility.  Additional history obtained from patient's son who states that the patient has had worsening depression at the facility and ultimately checked herself out of this facility and has been home with 2 hours of home health daily for the last 2 weeks.  Family states that patient has been immobile on the couch for these 2 weeks and is unable to take care of herself, urinating and defecating on herself.  Patient arrives with complaints of abdominal pain, toe pain, mild shortness of breath.  Denies chest pain, headache, fever or other systemic symptoms.   Past Medical History Past Medical History:  Diagnosis Date   AICD (automatic cardioverter/defibrillator) present    downgraded to CRT-P in 2014   Anemia    Arthritis    Asthma    Atrial fibrillation ([place]) 10/24/2015   Questionable history of A Fib/A Flutter. On device interrogation on 9/7, showed 0.52min of A Fib/A Flutter with 1% burden.  Device check in Jan 2018 showed no sustained AF (runs of less than 30 seconds). No anticoagulation indicated.   CAD (coronary artery disease)    a. 10/09/16 LHC: SVG->LAD patent, SVG->Diag patent, SVG->RCA patent,  SVG->LCx occluded. EF 60%, b. 10/31/16 LHC DES to AV groove Circ, DES to intermed branch   Chronic lower back pain    Fatty liver disease, nonalcoholic    GERD (gastroesophageal reflux disease)    Gout    H/O hiatal hernia    High cholesterol    Hyperplastic colon polyp    Hypertension    IBS (irritable bowel syndrome)    Internal hemorrhoids    INTERNAL HEMORRHOIDS WITHOUT MENTION COMP 04/12/2007   Qualifier: Diagnosis of  By: Olevia Perches MD, Dora M    Nonischemic cardiomyopathy ([place]) 11/22/2011   Pt responded to BiV ICD- last EF 55-60% Sept 2017   Obesity    OSA (obstructive sleep apnea)    "can't tolerate a mask", wears 2L nocturnal O2   Presence of permanent cardiac pacemaker    11/02/12 [employer] V273 INTUA PPM   Stroke ([place]) 09/2020   Type II diabetes mellitus ([place])    Patient Active Problem List   Diagnosis Date Noted   Acute renal failure superimposed on stage 4 chronic kidney disease ([place]) 05/11/2022   Acute on chronic systolic CHF (congestive heart failure) ([place]) 04/23/2022   Expressive aphasia 0000000   Acute metabolic encephalopathy 0000000   Asthma without acute exacerbation 04/21/2022   Heart failure with reduced ejection fraction ([place]) 04/21/2022   Candida esophagitis ([place]) 03/20/2022   Gastritis and gastroduodenitis 03/20/2022   Nausea without vomiting 03/20/2022   Heme positive stool 03/19/2022   Acute on chronic anemia 03/19/2022   Abdominal pain, chronic, epigastric 03/19/2022  Acute pancreatitis 03/17/2022   Dyslipidemia 03/17/2022   GERD without esophagitis 03/17/2022   Coronary artery disease 03/17/2022   Pain from implanted hardware 02/06/2022   Diabetic ulcer of toe ([place]) 11/18/2021   Pain due to onychomycosis of toenails of both feet 11/18/2021   Pressure injury of back, stage 2 ([place]) 11/03/2021   Acute on chronic respiratory failure with hypoxia ([place]) 11/02/2021   History of fracture of left hip 09/10/2021   Iatrogenic adrenal  insufficiency ([place]) 09/10/2021   T9 vertebral fracture ([place]) 09/09/2021   Acute kidney injury superimposed on chronic kidney disease ([place]) 09/09/2021   Chronic hypoxemic respiratory failure ([place]) 09/09/2021   Acute combined systolic and diastolic heart failure (HCC)    Acute CHF (congestive heart failure) ([place]) 08/04/2021   Callus 07/19/2021   Hypervolemia associated with renal insufficiency 06/11/2021   Physical deconditioning 06/11/2021   Subcutaneous fat necrosis    Pressure injury of skin 04/13/2021   Closed left hip fracture ([place]) secondary to fall at home 04/03/2021   Closed fracture of left superior pubic ramus ([place]) 04/03/2021   Chronic kidney disease, stage III B (moderate) ([place]) 04/03/2021   Fall at home, initial encounter 04/03/2021   Hypokalemia 04/03/2021   Obesity hypoventilation syndrome ([place]) 04/02/2021   Primary osteoarthritis, left ankle and foot 02/05/2021   Allergy to shellfish 01/15/2021   Decreased estrogen level 01/15/2021   Diabetic renal disease ([place]) 01/15/2021   Gallstones 01/15/2021   Idiopathic gout 01/15/2021   Mild intermittent asthma 01/15/2021   Pure hypercholesterolemia 01/15/2021   CHF (congestive heart failure) ([place]) 01/07/2021   Chronic diastolic heart failure ([place]) 12/31/2020   Nasal congestion with rhinorrhea 10/24/2020   Carotid stenosis 10/01/2020   Ischemic cardiomyopathy 09/27/2020   Hypertension 09/19/2020   Acute left-sided weakness 09/16/2020   Blood blister 08/01/2020   Elevated troponin 06/01/2019   Irritable bowel syndrome with constipation 10/21/2018   Chronic pain 07/30/2018   Congestive heart failure (CHF) ([place]) 07/30/2018   Supplemental oxygen dependent 07/30/2018   Leukopenia 05/03/2018   Thrombocytopenia ([place]) 05/03/2018   Pneumonia due to human metapneumovirus 05/02/2018   Chest pain of uncertain etiology [id number]   Allergic rhinitis 04/13/2018   S/P laparoscopic cholecystectomy 04/09/2018   Respiratory failure  ([place]) 03/07/2018   Polyp of cecum    Polyp of transverse colon    Positive colorectal cancer screening using Cologuard test    Dysphagia    Acute on chronic renal failure ([place]) 11/20/2017   Coronary artery disease of native artery of native heart with stable angina pectoris ([place]) 11/20/2017   Diabetes mellitus without complication ([place]) [id number]   Abdominal pain, epigastric 11/20/2017   Vasomotor rhinitis 10/16/2016   Right facial numbness 03/05/2016   ESRD (end stage renal disease) ([place]) 03/05/2016   Atrial fibrillation ([place]) 10/24/2015   Chest pain with moderate risk of acute coronary syndrome 10/23/2015   History of TIA (transient ischemic attack) 10/22/2015   CHB (complete heart block) ([place]) 06/22/2015   Allergic drug rash due to anti-infective agent 04/29/2015   Fatigue 02/14/2015   Chronic diastolic CHF (congestive heart failure) ([place]) 02/14/2015   Cellulitis 12/21/2014   S/P CABG x 5 11/29/2014   CAD S/P percutaneous coronary angioplasty    Diarrhea 07/12/2014   Abdominal pain 07/12/2014   Acute on chronic diastolic heart failure ([place]) 11/04/2013   Mixed hypercholesterolemia and hypertriglyceridemia 04/22/2013   Vertigo 04/22/2013   Dyspnea on exertion 08/25/2012   Allergic to IV contrast 07/23/2012   Unstable angina ([place]) 07/22/2012  Obesity 11/22/2011   Cardiomyopathy ([place]) 11/22/2011   Gastroesophageal reflux disease 11/22/2011   Back pain 11/22/2011   OSA (obstructive sleep apnea) 11/22/2011   HEMORRHOIDS-EXTERNAL 02/21/2010   NAUSEA 02/21/2010   ABDOMINAL PAIN -GENERALIZED 02/21/2010   PERSONAL HX COLONIC POLYPS 02/21/2010   ANEMIA 04/16/2007   HTN (hypertension), benign 04/16/2007   DIVERTICULOSIS, COLON 04/16/2007   ARTHRITIS 04/16/2007   INTERNAL HEMORRHOIDS WITHOUT MENTION COMP 04/12/2007   Sleep apnea 04/12/2007   Cardiac resynchronization therapy pacemaker (CRT-P) in place 04/12/2007   Gastritis without bleeding 12/14/2006   COLONIC POLYPS,  HYPERPLASTIC 09/27/2002   FATTY LIVER DISEASE 02/16/2002   Home Medication(s) Prior to Admission medications   Medication Sig Start Date End Date Taking? Authorizing Provider  acetaminophen (TYLENOL) 325 MG tablet Take 2 tablets (650 mg total) by mouth every 6 (six) hours as needed for mild pain (or Fever >/= 101). 03/24/22   Elgergawy, Silver Huguenin, MD  albuterol (PROVENTIL HFA;VENTOLIN HFA) 108 (90 Base) MCG/ACT inhaler Inhale 2 puffs into the lungs every 6 (six) hours as needed for wheezing or shortness of breath. 05/02/18   Norval Morton, MD  albuterol (PROVENTIL) (2.5 MG/3ML) 0.083% nebulizer solution Take 3 mLs (2.5 mg total) by nebulization every 6 (six) hours as needed for wheezing or shortness of breath. 05/02/18   Norval Morton, MD  allopurinol (ZYLOPRIM) 100 MG tablet Take 1 tablet (100 mg total) by mouth daily. Please contact the office to schedule appointment for additional refills. 1st Attempt. Patient taking differently: Take 100 mg by mouth daily. 12/05/21   Croitoru, [name], MD  aspirin EC 81 MG tablet Take 1 tablet (81 mg total) by mouth daily. Swallow whole. 04/26/22   Annita Brod, MD  barrier cream (NON-SPECIFIED) CREA Apply 1 Application topically See admin instructions. Apply topically to buttocks every shift and as needed for each incontinence episode.    [provider]  budesonide-formoterol (SYMBICORT) 160-4.5 MCG/ACT inhaler Inhale 1 puff into the lungs 2 (two) times daily.    [provider]  Cal Carb-Mag Hydrox-Simeth (MYLANTA COAT & COOL) 1200-270-80 MG/10ML SUSP Take 30 mLs by mouth 3 (three) times daily as needed (gas pains).    [provider]  carboxymethylcellulose (REFRESH PLUS) 0.5 % SOLN Place 1 drop into both eyes 3 (three) times daily as needed (dry eyes).    [provider]  cycloSPORINE (RESTASIS) 0.05 % ophthalmic emulsion Place 1 drop into both eyes 2 (two) times daily.    [provider]  dicyclomine  (BENTYL) 10 MG capsule Take 1 capsule (10 mg total) by mouth every 6 (six) hours as needed for spasms. 04/08/22   Cirigliano, Vito V, DO  ferrous sulfate 325 (65 FE) MG tablet Take 1 tablet (325 mg total) by mouth daily with breakfast. 02/19/22 04/21/22  Antonieta Pert, MD  fluticasone (FLONASE) 50 MCG/ACT nasal spray Place 1 spray into both nostrils daily as needed for allergies.    [provider]  folic acid (FOLVITE) 1 MG tablet Take 1 tablet (1 mg total) by mouth daily. 04/25/22 05/25/22  Annita Brod, MD  hydrOXYzine (ATARAX) 25 MG tablet Take 25 mg by mouth 2 (two) times daily as needed for itching.    [provider]  isosorbide mononitrate (IMDUR) 30 MG 24 hr tablet Take 1 tablet (30 mg total) by mouth daily. 04/25/22   Annita Brod, MD  Lido-PE-Glycerin-Petrolatum (PREPARATION H RAPID RELIEF RE) Place 1 spray rectally every 6 (six) hours as needed (hemorrhoids).  [provider]  lidocaine 4 % Place 1 patch onto the skin daily as needed (pain).    [provider]  methocarbamol (ROBAXIN) 500 MG tablet Take 500 mg by mouth every 6 (six) hours as needed for muscle spasms.    [provider]  naloxegol oxalate (MOVANTIK) 12.5 MG TABS tablet Take 1 tablet (12.5 mg total) by mouth daily. 04/08/22   Cirigliano, Vito V, DO  nitroGLYCERIN (NITROSTAT) 0.4 MG SL tablet Place 1 tablet (0.4 mg total) under the tongue every 5 (five) minutes as needed for chest pain. Patient taking differently: Place 0.4 mg under the tongue every 5 (five) minutes x 3 doses as needed for chest pain. 11/13/16 04/21/22  Erlene Quan, PA-C  Nystatin (GERHARDT'S BUTT CREAM) CREA Apply 1 Application topically 2 (two) times daily. 04/25/22   Annita Brod, MD  ondansetron (ZOFRAN) 4 MG tablet Take 4 mg by mouth every 8 (eight) hours as needed for nausea or vomiting.    [provider]  oxyCODONE (OXY IR/ROXICODONE) 5 MG immediate release tablet Take 1 tablet (5 mg  total) by mouth every 6 (six) hours as needed for severe pain. 04/25/22   Annita Brod, MD  OXYGEN Inhale 2-3 L/min into the lungs continuous.    [provider]  oxymetazoline (AFRIN) 0.05 % nasal spray Place 1 spray into both nostrils 2 (two) times daily as needed for congestion.    [provider]  pantoprazole (PROTONIX) 40 MG tablet Take 1 tablet (40 mg total) by mouth 2 (two) times daily. 07/08/19   [name], MD  polyethylene glycol powder (GLYCOLAX/MIRALAX) 17 GM/SCOOP powder Take 17 g by mouth daily as needed (constipation).    [provider]  rosuvastatin (CRESTOR) 40 MG tablet Take 1 tablet (40 mg total) by mouth every evening. 04/25/22   Annita Brod, MD  simethicone (MYLICON) 80 MG chewable tablet Chew 80 mg by mouth 4 (four) times daily as needed for flatulence.    [provider]  sucralfate (CARAFATE) 1 GM/10ML suspension Take 10 mLs (1 g total) by mouth 4 (four) times daily -  with meals and at bedtime. 03/24/22   Elgergawy, Silver Huguenin, MD  ticagrelor (BRILINTA) 90 MG TABS tablet Take 1 tablet (90 mg total) by mouth 2 (two) times daily. 04/25/22   Annita Brod, MD  vitamin B-12 (CYANOCOBALAMIN) 1000 MCG tablet Take 1 tablet (1,000 mcg total) by mouth daily. 08/15/21   Caren Griffins, MD                                                                                                                                    Past Surgical History Past Surgical History:  Procedure Laterality Date   ABDOMINAL ULTRASOUND  12/01/2011   Peripelvic cysts- #1- 2.4x1.9x2.3cm, #2-1.2x0.9x1.2cm   ANKLE FRACTURE SURGERY Right    "had rod put in"   ANTERIOR CERVICAL DECOMP/DISCECTOMY  FUSION     APPENDECTOMY     BACK SURGERY     BIOPSY  12/29/2017   Procedure: BIOPSY;  Surgeon: [name], MD;  [facility]: [facility];  Service: Endoscopy;;   BIOPSY  03/20/2022   Procedure: BIOPSY;  Surgeon: Lavena Bullion, DO;  Location: [facility];  Service: Gastroenterology;;   BIV ICD INSERTION CRT-D  2001?   BIV PACEMAKER GENERATOR CHANGE OUT N/A 11/02/2012   Procedure: BIV PACEMAKER GENERATOR CHANGE OUT;  Surgeon: Sanda Klein, MD;  Location: [facility] CATH LAB;  Service: Cardiovascular;  Laterality: N/A;   CARDIAC CATHETERIZATION  05/17/1999   No significant coronary obstructive disease w/ mild 20% luminal irregularity of the first diag branch of the LAD   CARDIAC CATHETERIZATION  07/08/2002   No significant CAD, moderately depressed LV systolic function   CARDIAC CATHETERIZATION Bilateral 04/26/2007   Normal findings, recommend medical therapy   CARDIAC CATHETERIZATION  02/18/2008   Moderate CAD, would benefit from having a functional study, recommend continue medical therapy   CARDIAC CATHETERIZATION  07/23/2012   Medical therapy   CARDIAC CATHETERIZATION N/A 11/24/2014   Procedure: Left Heart Cath and Coronary Angiography;  Surgeon: Troy Sine, MD;  Location: [facility] CV LAB;  Service: Cardiovascular;  Laterality: N/A;   CARDIAC CATHETERIZATION  11/27/2014   Procedure: Intravascular Pressure Wire/FFR Study;  Surgeon: Peter M Martinique, MD;  Location: [facility] LAB;  Service: Cardiovascular;;   CARDIAC CATHETERIZATION  10/09/2016   CHOLECYSTECTOMY N/A 04/09/2018   Procedure: LAPAROSCOPIC CHOLECYSTECTOMY;  Surgeon: Greer Pickerel, MD;  [facility];  Service: General;  Laterality: N/A;   COLONOSCOPY WITH PROPOFOL N/A 12/29/2017   Procedure: COLONOSCOPY WITH PROPOFOL;  Surgeon: [name], MD;  [facility];  Service: Endoscopy;  Laterality: N/A;   CONVERSION TO TOTAL HIP Left 02/12/2022   Procedure: NAIL REMOVAL WITH CONVERSION TO TOTAL HIP;  Surgeon: Willaim Sheng, MD;  Location: [facility];  Service: Orthopedics;  Laterality: Left;   CORONARY ANGIOGRAM  2010   CORONARY ARTERY BYPASS GRAFT N/A 11/29/2014   Procedure: CORONARY ARTERY BYPASS GRAFTING x 5 (LIMA-LAD, SVG-D, SVG-OM1-OM2, SVG-PD);   Surgeon: Melrose Nakayama, MD;  Location: [facility];  Service: Open Heart Surgery;  Laterality: N/A;   CORONARY STENT INTERVENTION N/A 10/31/2016   Procedure: CORONARY STENT INTERVENTION;  Surgeon: Burnell Blanks, MD;  Location: [facility] CV LAB;  Service: Cardiovascular;  Laterality: N/A;   ESOPHAGOGASTRODUODENOSCOPY (EGD) WITH PROPOFOL N/A 12/29/2017   Procedure: ESOPHAGOGASTRODUODENOSCOPY (EGD) WITH PROPOFOL;  Surgeon: [name], MD;  [facility];  Service: Endoscopy;  Laterality: N/A;   ESOPHAGOGASTRODUODENOSCOPY (EGD) WITH PROPOFOL N/A 03/20/2022   Procedure: ESOPHAGOGASTRODUODENOSCOPY (EGD) WITH PROPOFOL;  Surgeon: Lavena Bullion, DO;  Location: [facility];  Service: Gastroenterology;  Laterality: N/A;   FRACTURE SURGERY     INSERT / REPLACE / REMOVE PACEMAKER  1999   INTRAMEDULLARY (IM) NAIL INTERTROCHANTERIC Left 04/04/2021   Procedure: INTRAMEDULLARY (IM) NAIL INTERTROCHANTRIC;  Surgeon: Vanetta Mulders, MD;  Location: [facility];  Service: Orthopedics;  Laterality: Left;   IR FLUORO GUIDE CV LINE LEFT  04/11/2021   IR US GUIDE VASC ACCESS LEFT  04/11/2021   KNEE ARTHROSCOPY Bilateral    LEFT HEART CATH AND CORS/GRAFTS ANGIOGRAPHY N/A 12/09/2017   Procedure: LEFT HEART CATH AND CORS/GRAFTS ANGIOGRAPHY;  Surgeon: Troy Sine, MD;  Location: [facility] CV LAB;  Service: Cardiovascular;  Laterality: N/A;   LEFT HEART CATHETERIZATION WITH CORONARY ANGIOGRAM N/A 07/23/2012  Procedure: LEFT HEART CATHETERIZATION WITH CORONARY ANGIOGRAM;  Surgeon: Leonie Man, MD;  Location: [facility] C[facility] LAB;  Service: Cardiovascular;  Laterality: N/A;   LEXISCAN MYOVIEW  11/14/2011   Mild-moderate defect seen in Mid Inferolateral and Mid Anterolateral regions-consistant w/ infarct/scar. No significant ischemia demonstrated.   LUMBAR PERCUTANEOUS PEDICLE SCREW 4 LEVEL N/A 09/10/2021   Procedure: THORACIC SEVEN THROUGH THORACIC ELEVEN  PERCUTANEOUS PEDICLE SCREW PLACEMENT;  Surgeon:  Judith Part, MD;  Location: [facility];  Service: Neurosurgery;  Laterality: N/A;   POLYPECTOMY  12/29/2017   Procedure: POLYPECTOMY;  Surgeon: [name], MD;  [facility];  Service: Endoscopy;;   PPM GENERATOR CHANGEOUT N/A 12/31/2020   Procedure: PPM GENERATOR CHANGEOUT;  Surgeon: Sanda Klein, MD;  Location: [facility] CV LAB;  Service: Cardiovascular;  Laterality: N/A;   RIGHT/LEFT HEART CATH AND CORONARY ANGIOGRAPHY N/A 10/09/2016   Procedure: RIGHT/LEFT HEART CATH AND CORONARY ANGIOGRAPHY;  Surgeon: Jolaine Artist, MD;  Location: [facility] LAB;  Service: Cardiovascular;  Laterality: N/A;   RIGHT/LEFT HEART CATH AND CORONARY/GRAFT ANGIOGRAPHY N/A 03/11/2019   Procedure: RIGHT/LEFT HEART CATH AND CORONARY/GRAFT ANGIOGRAPHY;  Surgeon: Jolaine Artist, MD;  Location: [facility] CV LAB;  Service: Cardiovascular;  Laterality: N/A;   TEE WITHOUT CARDIOVERSION N/A 11/29/2014   Procedure: TRANSESOPHAGEAL ECHOCARDIOGRAM (TEE);  Surgeon: Melrose Nakayama, MD;  Location: [facility];  Service: Open Heart Surgery;  Laterality: N/A;   TRANSCAROTID ARTERY REVASCULARIZATION  Right 10/01/2020   Procedure: RIGHT TRANSCAROTID ARTERY REVASCULARIZATION;  Surgeon: [name], MD;  Location: [facility];  Service: Vascular;  Laterality: Right;   TRANSTHORACIC ECHOCARDIOGRAM  07/23/2012   EF 55-60%, normal-mild   TUBAL LIGATION     ULTRASOUND GUIDANCE FOR VASCULAR ACCESS Left 10/01/2020   Procedure: ULTRASOUND GUIDANCE FOR VASCULAR ACCESS;  Surgeon: [name], MD;  Location: [facility];  Service: Vascular;  Laterality: Left;   Family History Family History  Problem Relation Age of Onset   Breast cancer Mother    Diabetes Mother    Asthma Sister    Heart disease Maternal Grandmother    Kidney disease Maternal Grandmother    Diabetes Maternal Grandmother    Glaucoma Maternal Aunt    Heart disease Maternal Aunt    Colon cancer Neg Hx    Stomach cancer Neg Hx     Pancreatic cancer Neg Hx     Social History Social History   Tobacco Use   Smoking status: Former    Packs/day: 0.25    Years: 3.00    Additional pack years: 0.00    Total pack years: 0.75    Types: Cigarettes    Quit date: 02/11/1968    Years since quitting: 54.2   Smokeless tobacco: Never  Vaping Use   Vaping Use: Never used  Substance Use Topics   Alcohol use: No   Drug use: No   Allergies Ivp dye [iodinated contrast media], Shellfish allergy, Sulfa antibiotics, Iodine, Benadryl [diphenhydramine], Lipitor [atorvastatin], Mitigare [colchicine], Uloric [febuxostat], Vibra-tab [doxycycline], Keflex [cephalexin], and Zithromax [azithromycin]  Review of Systems Review of Systems  Gastrointestinal:  Positive for abdominal pain.  Musculoskeletal:  Positive for arthralgias.  Skin:  Positive for wound.    Physical Exam Vital Signs  I have reviewed the triage vital signs BP (!) 141/70   Pulse (!) 127   Temp 97.9 F (36.6 C) (Oral)   Resp 20   Ht 5\' 3"  (1.6 m)   Wt 71.2 kg   SpO2 100%  BMI 27.81 kg/m   Physical Exam Vitals and nursing note reviewed.  Constitutional:      General: She is not in acute distress.    Appearance: She is well-developed.  HENT:     Head: Normocephalic and atraumatic.  Eyes:     Conjunctiva/sclera: Conjunctivae normal.  Cardiovascular:     Rate and Rhythm: Normal rate and regular rhythm.     Heart sounds: No murmur heard. Pulmonary:     Effort: Pulmonary effort is normal. No respiratory distress.     Breath sounds: Normal breath sounds.  Abdominal:     Palpations: Abdomen is soft.     Tenderness: There is abdominal tenderness.  Musculoskeletal:        General: No swelling.     Cervical back: Neck supple.  Skin:    General: Skin is warm and dry.     Capillary Refill: Capillary refill takes less than 2 seconds.     Findings: Lesion present.  Neurological:     Mental Status: She is alert.  Psychiatric:        Mood and Affect:  Mood normal.     ED Results and Treatments Labs (all labs ordered are listed, but only abnormal results are displayed) Labs Reviewed  CBC WITH DIFFERENTIAL/PLATELET - Abnormal; Notable for the following components:      Result Value   WBC 11.0 (*)    Hemoglobin 11.5 (*)    RDW 19.0 (*)    Platelets 93 (*)    Neutro Abs 9.0 (*)    All other components within normal limits  BASIC METABOLIC PANEL - Abnormal; Notable for the following components:   Potassium 2.1 (*)    Glucose, Bld 128 (*)    BUN 60 (*)    Creatinine, Ser 3.61 (*)    Calcium 8.8 (*)    GFR, Estimated 12 (*)    All other components within normal limits  URINALYSIS, ROUTINE W REFLEX MICROSCOPIC - Abnormal; Notable for the following components:   APPearance CLOUDY (*)    Hgb urine dipstick SMALL (*)    Protein, ur 100 (*)    Leukocytes,Ua LARGE (*)    Bacteria, UA MANY (*)    All other components within normal limits  URINE CULTURE  LACTIC ACID, PLASMA  MAGNESIUM  CREATININE, URINE, RANDOM  SODIUM, URINE, RANDOM                                                                                                                          Radiology CT ABDOMEN PELVIS WO CONTRAST  Result Date: 05/11/2022 CLINICAL DATA:  Epigastric pain EXAM: CT ABDOMEN AND PELVIS WITHOUT CONTRAST TECHNIQUE: Multidetector CT imaging of the abdomen and pelvis was performed following the standard protocol without IV contrast. RADIATION DOSE REDUCTION: This exam was performed according to the departmental dose-optimization program which includes automated exposure control, adjustment of the mA and/or kV according to patient size and/or use of iterative reconstruction technique. COMPARISON:  CT  abdomen and pelvis dated 03/17/2022 FINDINGS: Lower chest: No focal consolidation or pulmonary nodule in the lung bases. No pleural effusion or pneumothorax demonstrated. Partially imaged heart size is normal. Partially imaged median sternotomy wires are  nondisplaced. Hepatobiliary: Innumerable new hepatic hypodensities measuring up to 3.0 cm (2:16). No intra or extrahepatic biliary ductal dilation. Cholecystectomy. Pancreas: Ill-defined 1.4 cm hypoattenuating focus within the pancreatic head (2:29), not well seen on prior examination. No main pancreatic ductal dilation. Spleen: Normal in size without focal abnormality. Adrenals/Urinary Tract: No adrenal nodules. Bilateral renal cortical thinning. No hydronephrosis or calculi. 2.2 cm right lower pole exophytic lesion does not demonstrate simple fluid attenuation. Additional bilateral simple-appearing renal cysts. No specific follow-up imaging recommended. No focal bladder wall thickening. Stomach/Bowel: Normal appearance of the stomach. No evidence of bowel wall thickening, distention, or inflammatory changes. Colonic diverticulosis without acute diverticulitis. Appendix is not discretely seen. Vascular/Lymphatic: Aortic atherosclerosis. Retroaortic left renal vein. No enlarged abdominal or pelvic lymph nodes. Reproductive: No adnexal masses. Other: Mild presacral edema.  No free air. Musculoskeletal: Lytic focus along the medial left acetabulum (2:66) measuring 2.3 cm at the site of prior compression screw. Status post left hip arthroplasty. Postsurgical changes from partially imaged thoracic spinal fixation. Unchanged fracture of T9. Multilevel degenerative changes of the partially imaged thoracic and lumbar spine. Unchanged grade 1 anterolisthesis at L5-S1. IMPRESSION: 1. Innumerable new hepatic hypodensities measuring up to 3.0 cm suspicious for metastatic disease. 2. Ill-defined 1.4 cm hypoattenuating focus within the pancreatic head, not well seen on prior examination, suspicious for primary pancreatic neoplasm. 3. Lytic focus along the medial left acetabulum measuring up to 2.3 cm at the site of prior compression screw may be postsurgical. 4. Indeterminate 2.2 cm right renal lower pole exophytic lesion. Given  the patient's history of contraindication to MRI and contrast-enhanced CT, ultrasound of the kidneys can be considered for further characterization. 5.  Aortic Atherosclerosis (ICD10-I70.0). These results were called by telephone at the time of interpretation on 05/11/2022 at 7:37 pm to provider Kharter Brew [facility] , who verbally acknowledged these results. Electronically Signed   By: Darrin Nipper M.D.   On: 05/11/2022 19:41   DG Chest Portable 1 View  Result Date: 05/11/2022 CLINICAL DATA:  Shortness of breath EXAM: PORTABLE CHEST 1 VIEW COMPARISON:  04/21/2022 FINDINGS: Cardiomegaly. Prior CABG. Right pacer remains in place, unchanged. No confluent airspace opacities, effusions or edema. Aortic atherosclerosis. No acute bony abnormality. IMPRESSION: Cardiomegaly.  No active disease. Electronically Signed   By: Rolm Baptise M.D.   On: 05/11/2022 17:50   DG Foot Complete Left  Result Date: 05/11/2022 CLINICAL DATA:  Wounds to the second and third digits EXAM: LEFT FOOT - COMPLETE 3 VIEW COMPARISON:  None Available. FINDINGS: There is no definite evidence of fracture or dislocation. Old fracture of the partially imaged distal fibula. Degenerative changes of the foot. Soft tissues are unremarkable. IMPRESSION: 1. No definite evidence of fracture or dislocation. 2. Old fracture of the partially imaged distal fibula. 3. No soft tissue abnormality to correspond to reported wounds. Electronically Signed   By: Darrin Nipper M.D.   On: 05/11/2022 17:49    Pertinent labs & imaging results that were available during my care of the patient were reviewed by me and considered in my medical decision making (see MDM for details).  Medications Ordered in ED Medications  cefTRIAXone (ROCEPHIN) 1 g in sodium chloride 0.9 % 100 mL IVPB (has no administration in time range)  lactated ringers bolus 1,000 mL (0  mLs Intravenous Stopped 05/11/22 1753)  potassium chloride SA (KLOR-CON M) CR tablet 40 mEq (40 mEq Oral Given 05/11/22 1748)   magnesium oxide (MAG-OX) tablet 800 mg (800 mg Oral Given 05/11/22 1748)  potassium chloride 10 mEq in 100 mL IVPB (0 mEq Intravenous Stopped 05/11/22 1930)  cefTRIAXone (ROCEPHIN) 2 g in sodium chloride 0.9 % 100 mL IVPB (2 g Intravenous New Bag/Given 05/11/22 2106)                                                                                                                                     Procedures .Critical Care  Performed by: Teressa Lower, MD Authorized by: Teressa Lower, MD   Critical care provider statement:    Critical care time (minutes):  30   Critical care was necessary to treat or prevent imminent or life-threatening deterioration of the following conditions:  Metabolic crisis   Critical care was time spent personally by me on the following activities:  Development of treatment plan with patient or surrogate, discussions with consultants, evaluation of patient's response to treatment, examination of patient, ordering and review of laboratory studies, ordering and review of radiographic studies, ordering and performing treatments and interventions, pulse oximetry, re-evaluation of patient's condition and review of old charts   (including critical care time)  Medical Decision Making / ED Course   This patient presents to the ED for concern of toe pain, abdominal pain, failure to thrive, this involves an extensive number of treatment options, and is a complaint that carries with it a high risk of complications and morbidity.  The differential diagnosis includes fracture, osteomyelitis, electrolyte abnormality, urinary tract infection, pancreatitis, malignancy, intra-abdominal infection  MDM: Patient emergency room for evaluation of multiple complaints described above.  Physical exam with an abrasion over the second toe on the left with bleeding controlled.  Laboratory evaluation with severe hypokalemia to 2.1, new worsening AKI with BUN 60, creatinine 3.61, urinalysis  concerning for UTI with large leuk esterase, greater than 50 white blood cells and many bacteria.  Leukocytosis to 11.0, hemoglobin 11.5, lactic acid is normal.  Electrolytes repleted in fluid resuscitation begun.  X-ray of the toe without fracture or obvious evidence of osteomyelitis.  CT abdomen pelvis however is quite concerning with what appears to be a rapid development of a new pancreatic malignancy with hepatic metastases.  I spoke at length with the patient and both of her sons about these findings.  Suspect her contrast allergy and inability to obtain an MRI secondary to incompatible pacemaker may have led to delay in diagnosis and patient may need endoscopic biopsy by gastroenterology while inpatient.  Patient then admitted.  Antibiotics initiated for UTI.   Additional history obtained: -Additional history obtained from multiple family members -External records from outside source obtained and reviewed including: Chart review including previous notes, labs, imaging, consultation notes   Lab Tests: -I ordered, reviewed, and interpreted labs.  The pertinent results include:   Labs Reviewed  CBC WITH DIFFERENTIAL/PLATELET - Abnormal; Notable for the following components:      Result Value   WBC 11.0 (*)    Hemoglobin 11.5 (*)    RDW 19.0 (*)    Platelets 93 (*)    Neutro Abs 9.0 (*)    All other components within normal limits  BASIC METABOLIC PANEL - Abnormal; Notable for the following components:   Potassium 2.1 (*)    Glucose, Bld 128 (*)    BUN 60 (*)    Creatinine, Ser 3.61 (*)    Calcium 8.8 (*)    GFR, Estimated 12 (*)    All other components within normal limits  URINALYSIS, ROUTINE W REFLEX MICROSCOPIC - Abnormal; Notable for the following components:   APPearance CLOUDY (*)    Hgb urine dipstick SMALL (*)    Protein, ur 100 (*)    Leukocytes,Ua LARGE (*)    Bacteria, UA MANY (*)    All other components within normal limits  URINE CULTURE  LACTIC ACID, PLASMA   MAGNESIUM  CREATININE, URINE, RANDOM  SODIUM, URINE, RANDOM        Imaging Studies ordered: I ordered imaging studies including CT abdomen pelvis, chest x-ray, foot x-ray I independently visualized and interpreted imaging. I agree with the radiologist interpretation   Medicines ordered and prescription drug management: Meds ordered this encounter  Medications   lactated ringers bolus 1,000 mL   potassium chloride SA (KLOR-CON M) CR tablet 40 mEq   magnesium oxide (MAG-OX) tablet 800 mg   potassium chloride 10 mEq in 100 mL IVPB   cefTRIAXone (ROCEPHIN) 2 g in sodium chloride 0.9 % 100 mL IVPB    Order Specific Question:   Antibiotic Indication:    Answer:   UTI   cefTRIAXone (ROCEPHIN) 1 g in sodium chloride 0.9 % 100 mL IVPB    Order Specific Question:   Antibiotic Indication:    Answer:   UTI    -I have reviewed the patients home medicines and have made adjustments as needed  Critical interventions Electrolyte repletion, antibiotics, fluid resuscitation, extensive discussions with family    Cardiac Monitoring: The patient was maintained on a cardiac monitor.  I personally viewed and interpreted the cardiac monitored which showed an underlying rhythm of: NSR, sinus tachycardia  Social Determinants of Health:  Factors impacting patients care include: none   Reevaluation: After the interventions noted above, I reevaluated the patient and found that they have :improved  Co morbidities that complicate the patient evaluation  Past Medical History:  Diagnosis Date   AICD (automatic cardioverter/defibrillator) present    downgraded to CRT-P in 2014   Anemia    Arthritis    Asthma    Atrial fibrillation ([place]) 10/24/2015   Questionable history of A Fib/A Flutter. On device interrogation on 9/7, showed 0.15min of A Fib/A Flutter with 1% burden.  Device check in Jan 2018 showed no sustained AF (runs of less than 30 seconds). No anticoagulation indicated.   CAD (coronary  artery disease)    a. 10/09/16 LHC: SVG->LAD patent, SVG->Diag patent, SVG->RCA patent, SVG->LCx occluded. EF 60%, b. 10/31/16 LHC DES to AV groove Circ, DES to intermed branch   Chronic lower back pain    Fatty liver disease, nonalcoholic    GERD (gastroesophageal reflux disease)    Gout    H/O hiatal hernia    High cholesterol    Hyperplastic colon polyp    Hypertension  IBS (irritable bowel syndrome)    Internal hemorrhoids    INTERNAL HEMORRHOIDS WITHOUT MENTION COMP 04/12/2007   Qualifier: Diagnosis of  By: Olevia Perches MD, Dora M    Nonischemic cardiomyopathy ([place]) 11/22/2011   Pt responded to BiV ICD- last EF 55-60% Sept 2017   Obesity    OSA (obstructive sleep apnea)    "can't tolerate a mask", wears 2L nocturnal O2   Presence of permanent cardiac pacemaker    11/02/12 [employer] [id number] INTUA PPM   Stroke [facility]) 09/2020   Type II diabetes mellitus ([place])       Dispostion: I considered admission for this patient, and due to hypokalemia, new AKI and new pancreatic malignancy, patient require hospital admission     Final Clinical Impression(s) / ED Diagnoses Final diagnoses:  Hypokalemia  Pancreatic mass  Acute cystitis with hematuria     @PCDICTATION @    Teressa Lower, MD 05/11/22 2143

## 2022-05-11 NOTE — ED Notes (Signed)
Not in room, pt in CT 

## 2022-05-11 NOTE — H&P (Addendum)
History and Physical    [name] [id number] DOB: [birth year] DOA: 05/11/2022  PCP: Lujean Amel, MD  Patient coming from: Home  I have personally briefly reviewed patient's old medical records in [place]  Chief Complaint: Abdominal pain  HPI: [name] is a 77 y.o. female with medical history significant for chronic combined systolic and diastolic CHF (EF [id number] %), CHB s/p CRT-D, CKD stage IV, CAD s/p CABG, PAF not on anticoagulation, chronic respiratory failure with hypoxia on 3 L [place], asthma, T2DM, HTN, HLD, chronic thrombocytopenia, OSA intolerant to CPAP who presented to the ED for evaluation of abdominal pain and dysuria.  Patient recently admitted 04/21/2022-04/25/2022 for encephalopathy and expressive aphasia.  CT head was negative x 2 for any acute stroke.  MRI unable to be performed due to pacemaker.  CTA not performed due to contrast allergy.  Transcranial and carotid Dopplers were unremarkable.  Her Plavix was discontinued and she was switched to DAPT with aspirin and Brilinta for 4 weeks to be followed by aspirin alone.  PT/OT recommended SNF however insurance denied it and patient returned back to long-term care.  Patient states that she has been having 1 day of epigastric abdominal pain without nausea or vomiting.  She has had dysuria as well.  She denies any diarrhea, fevers, chills, diaphoresis.  She has had some exertional dyspnea.  Denies chest pain.  She says she hit her left foot resulting in a laceration to her second left toe which has been bleeding.  Patient reportedly checked herself out of her long-term care facility and has been at home mostly immobile on the couch unable to care for herself.  She admits that it was a mistake to have left the facility.  ED Course  Labs/Imaging on admission: I have personally reviewed following labs and imaging studies.  Initial vitals showed BP 111/72, pulse 87, RR 17, temp 97.9 F, SpO2 96% on room  air.  Labs show WBC 11.0, hemoglobin 11.5, platelets 93,000, sodium 136, potassium 2.1, bicarb 23, BUN 60, creatinine 3.61, serum glucose 128, lactic acid 1.3.  Urinalysis shows negative nitrites, large leukocytes, 0-5 RBCs, >50 WBCs, many bacteria on microscopy.  Left foot x-ray negative for fracture or dislocation.  Portable chest x-ray shows cardiomegaly, prior CABG changes, no active disease.  CT abdomen/pelvis without contrast shows innumerable new hepatic hypodensities measuring up to 3.0 cm suspicious for metastatic disease.  Ill-defined 1.4 cm hypoattenuating focus within the pancreatic head is seen, suspicious for primary pancreatic neoplasm.  Lytic focus along medial left acetabulum measuring up to 2.3 cm at the site of prior compression screw may be postsurgical.  Indeterminate 2.2 cm right renal lower pole exophytic lesion noted.  Patient was given IV ceftriaxone, 1 L LR, IV K 10 mEq x 1, oral K 40 mEq, Mag-Ox 800 mg.  The hospitalist service was consulted to admit for further evaluation and management.  Review of Systems: All systems reviewed and are negative except as documented in history of present illness above.   Past Medical History:  Diagnosis Date   AICD (automatic cardioverter/defibrillator) present    downgraded to CRT-P in 2014   Anemia    Arthritis    Asthma    Atrial fibrillation ([place]) 10/24/2015   Questionable history of A Fib/A Flutter. On device interrogation on 9/7, showed 0.44min of A Fib/A Flutter with 1% burden.  Device check in Jan 2018 showed no sustained AF (runs of less than 30 seconds). No anticoagulation indicated.  CAD (coronary artery disease)    a. 10/09/16 LHC: SVG->LAD patent, SVG->Diag patent, SVG->RCA patent, SVG->LCx occluded. EF 60%, b. 10/31/16 LHC DES to AV groove Circ, DES to intermed branch   Chronic lower back pain    Fatty liver disease, nonalcoholic    GERD (gastroesophageal reflux disease)    Gout    H/O hiatal hernia    High  cholesterol    Hyperplastic colon polyp    Hypertension    IBS (irritable bowel syndrome)    Internal hemorrhoids    INTERNAL HEMORRHOIDS WITHOUT MENTION COMP 04/12/2007   Qualifier: Diagnosis of  By: Olevia Perches MD, Dora M    Nonischemic cardiomyopathy ([place]) 11/22/2011   Pt responded to BiV ICD- last EF 55-60% Sept 2017   Obesity    OSA (obstructive sleep apnea)    "can't tolerate a mask", wears 2L nocturnal O2   Presence of permanent cardiac pacemaker    11/02/12 [employer] V273 INTUA PPM   Stroke ([place]) 09/2020   Type II diabetes mellitus ([place])     Past Surgical History:  Procedure Laterality Date   ABDOMINAL ULTRASOUND  12/01/2011   Peripelvic cysts- #1- 2.4x1.9x2.3cm, #2-1.2x0.9x1.2cm   ANKLE FRACTURE SURGERY Right    "had rod put in"   ANTERIOR CERVICAL DECOMP/DISCECTOMY FUSION     APPENDECTOMY     BACK SURGERY     BIOPSY  12/29/2017   Procedure: BIOPSY;  Surgeon: [name], MD;  Location: [facility];  Service: Endoscopy;;   BIOPSY  03/20/2022   Procedure: BIOPSY;  Surgeon: Lavena Bullion, DO;  Location: [facility];  Service: Gastroenterology;;   BIV ICD INSERTION CRT-D  2001?   BIV PACEMAKER GENERATOR CHANGE OUT N/A 11/02/2012   Procedure: BIV PACEMAKER GENERATOR CHANGE OUT;  Surgeon: Sanda Klein, MD;  Location: [facility];  Service: Cardiovascular;  Laterality: N/A;   CARDIAC CATHETERIZATION  05/17/1999   No significant coronary obstructive disease w/ mild 20% luminal irregularity of the first diag branch of the LAD   CARDIAC CATHETERIZATION  07/08/2002   No significant CAD, moderately depressed LV systolic function   CARDIAC CATHETERIZATION Bilateral 04/26/2007   Normal findings, recommend medical therapy   CARDIAC CATHETERIZATION  02/18/2008   Moderate CAD, would benefit from having a functional study, recommend continue medical therapy   CARDIAC CATHETERIZATION  07/23/2012   Medical therapy   CARDIAC CATHETERIZATION N/A 11/24/2014   Procedure: Left  Heart Cath and Coronary Angiography;  Surgeon: Troy Sine, MD;  Location: [facility];  Service: Cardiovascular;  Laterality: N/A;   CARDIAC CATHETERIZATION  11/27/2014   Procedure: Intravascular Pressure Wire/FFR Study;  Surgeon: Peter M Martinique, MD;  Location: [facility];  Service: Cardiovascular;;   CARDIAC CATHETERIZATION  10/09/2016   CHOLECYSTECTOMY N/A 04/09/2018   Procedure: LAPAROSCOPIC CHOLECYSTECTOMY;  Surgeon: Greer Pickerel, MD;  [facility];  Service: General;  Laterality: N/A;   COLONOSCOPY WITH PROPOFOL N/A 12/29/2017   Procedure: COLONOSCOPY WITH PROPOFOL;  Surgeon: [name], MD;  [facility];  Service: Endoscopy;  Laterality: N/A;   CONVERSION TO TOTAL HIP Left 02/12/2022   Procedure: NAIL REMOVAL WITH CONVERSION TO TOTAL HIP;  Surgeon: Willaim Sheng, MD;  Location: [facility];  Service: Orthopedics;  Laterality: Left;   CORONARY ANGIOGRAM  2010   CORONARY ARTERY BYPASS GRAFT N/A 11/29/2014   Procedure: CORONARY ARTERY BYPASS GRAFTING x 5 (LIMA-LAD, SVG-D, SVG-OM1-OM2, SVG-PD);  Surgeon: Melrose Nakayama, MD;  Location: [facility];  Service:  Open Heart Surgery;  Laterality: N/A;   CORONARY STENT INTERVENTION N/A 10/31/2016   Procedure: CORONARY STENT INTERVENTION;  Surgeon: Burnell Blanks, MD;  Location: [facility] CV LAB;  Service: Cardiovascular;  Laterality: N/A;   ESOPHAGOGASTRODUODENOSCOPY (EGD) WITH PROPOFOL N/A 12/29/2017   Procedure: ESOPHAGOGASTRODUODENOSCOPY (EGD) WITH PROPOFOL;  Surgeon: [name], MD;  [facility];  Service: Endoscopy;  Laterality: N/A;   ESOPHAGOGASTRODUODENOSCOPY (EGD) WITH PROPOFOL N/A 03/20/2022   Procedure: ESOPHAGOGASTRODUODENOSCOPY (EGD) WITH PROPOFOL;  Surgeon: Lavena Bullion, DO;  Location: [facility];  Service: Gastroenterology;  Laterality: N/A;   FRACTURE SURGERY     INSERT / REPLACE / REMOVE PACEMAKER  1999   INTRAMEDULLARY (IM) NAIL INTERTROCHANTERIC Left  04/04/2021   Procedure: INTRAMEDULLARY (IM) NAIL INTERTROCHANTRIC;  Surgeon: Vanetta Mulders, MD;  Location: [facility];  Service: Orthopedics;  Laterality: Left;   IR FLUORO GUIDE CV LINE LEFT  04/11/2021   IR US GUIDE VASC ACCESS LEFT  04/11/2021   KNEE ARTHROSCOPY Bilateral    LEFT HEART CATH AND CORS/GRAFTS ANGIOGRAPHY N/A 12/09/2017   Procedure: LEFT HEART CATH AND CORS/GRAFTS ANGIOGRAPHY;  Surgeon: Troy Sine, MD;  Location: [facility] CV LAB;  Service: Cardiovascular;  Laterality: N/A;   LEFT HEART CATHETERIZATION WITH CORONARY ANGIOGRAM N/A 07/23/2012   Procedure: LEFT HEART CATHETERIZATION WITH CORONARY ANGIOGRAM;  Surgeon: Leonie Man, MD;  Location: [facility];  Service: Cardiovascular;  Laterality: N/A;   LEXISCAN MYOVIEW  11/14/2011   Mild-moderate defect seen in Mid Inferolateral and Mid Anterolateral regions-consistant w/ infarct/scar. No significant ischemia demonstrated.   LUMBAR PERCUTANEOUS PEDICLE SCREW 4 LEVEL N/A 09/10/2021   Procedure: THORACIC SEVEN THROUGH THORACIC ELEVEN  PERCUTANEOUS PEDICLE SCREW PLACEMENT;  Surgeon: Judith Part, MD;  Location: [facility];  Service: Neurosurgery;  Laterality: N/A;   POLYPECTOMY  12/29/2017   Procedure: POLYPECTOMY;  Surgeon: [name], MD;  [facility];  Service: Endoscopy;;   PPM GENERATOR CHANGEOUT N/A 12/31/2020   Procedure: PPM GENERATOR CHANGEOUT;  Surgeon: Sanda Klein, MD;  Location: [place] CV LAB;  Service: Cardiovascular;  Laterality: N/A;   RIGHT/LEFT HEART CATH AND CORONARY ANGIOGRAPHY N/A 10/09/2016   Procedure: RIGHT/LEFT HEART CATH AND CORONARY ANGIOGRAPHY;  Surgeon: Jolaine Artist, MD;  Location: [place] CV LAB;  Service: Cardiovascular;  Laterality: N/A;   RIGHT/LEFT HEART CATH AND CORONARY/GRAFT ANGIOGRAPHY N/A 03/11/2019   Procedure: RIGHT/LEFT HEART CATH AND CORONARY/GRAFT ANGIOGRAPHY;  Surgeon: Jolaine Artist, MD;  Location: [place] CV LAB;  Service: Cardiovascular;   Laterality: N/A;   TEE WITHOUT CARDIOVERSION N/A 11/29/2014   Procedure: TRANSESOPHAGEAL ECHOCARDIOGRAM (TEE);  Surgeon: Melrose Nakayama, MD;  Location: [facility];  Service: Open Heart Surgery;  Laterality: N/A;   TRANSCAROTID ARTERY REVASCULARIZATION  Right 10/01/2020   Procedure: RIGHT TRANSCAROTID ARTERY REVASCULARIZATION;  Surgeon: [name], MD;  Location: [facility];  Service: Vascular;  Laterality: Right;   TRANSTHORACIC ECHOCARDIOGRAM  07/23/2012   EF 55-60%, normal-mild   TUBAL LIGATION     ULTRASOUND GUIDANCE FOR VASCULAR ACCESS Left 10/01/2020   Procedure: ULTRASOUND GUIDANCE FOR VASCULAR ACCESS;  Surgeon: [name], MD;  Location: [facility] [facility];  Service: Vascular;  Laterality: Left;    Social History:  reports that she quit smoking about 54 years ago. Her smoking use included cigarettes. She has a 0.75 pack-year smoking history. She has never used smokeless tobacco. She reports that she does not drink alcohol and does not use drugs.  Allergies  Allergen Reactions  Ivp Dye [Iodinated Contrast Media] Shortness Of Breath    No reaction to PO contrast with non-ionic dye.06-25-2014/rsm   Shellfish Allergy Anaphylaxis   Sulfa Antibiotics Shortness Of Breath   Iodine Hives   Benadryl [Diphenhydramine] Other (See Comments)    "THIS DRIVES ME CRAZY AND MAKES ME FEEL LIKE I AM DYING"    Lipitor [Atorvastatin] Other (See Comments)    Myalgias    Mitigare [Colchicine] Nausea And Vomiting   Uloric [Febuxostat] Other (See Comments)    Unknown reaction Documented on MAR   Vibra-Tab [Doxycycline] Other (See Comments)    Unknown reaction Documented on MAR   Keflex [Cephalexin] Itching and Rash   Zithromax [Azithromycin] Rash    Family History  Problem Relation Age of Onset   Breast cancer Mother    Diabetes Mother    Asthma Sister    Heart disease Maternal Grandmother    Kidney disease Maternal Grandmother    Diabetes Maternal Grandmother    Glaucoma Maternal Aunt     Heart disease Maternal Aunt    Colon cancer Neg Hx    Stomach cancer Neg Hx    Pancreatic cancer Neg Hx      Prior to Admission medications   Medication Sig Start Date End Date Taking? Authorizing Provider  acetaminophen (TYLENOL) 325 MG tablet Take 2 tablets (650 mg total) by mouth every 6 (six) hours as needed for mild pain (or Fever >/= 101). 03/24/22   Elgergawy, Silver Huguenin, MD  albuterol (PROVENTIL HFA;VENTOLIN HFA) 108 (90 Base) MCG/ACT inhaler Inhale 2 puffs into the lungs every 6 (six) hours as needed for wheezing or shortness of breath. 05/02/18   Norval Morton, MD  albuterol (PROVENTIL) (2.5 MG/3ML) 0.083% nebulizer solution Take 3 mLs (2.5 mg total) by nebulization every 6 (six) hours as needed for wheezing or shortness of breath. 05/02/18   Norval Morton, MD  allopurinol (ZYLOPRIM) 100 MG tablet Take 1 tablet (100 mg total) by mouth daily. Please contact the office to schedule appointment for additional refills. 1st Attempt. Patient taking differently: Take 100 mg by mouth daily. 12/05/21   Croitoru, [name], MD  aspirin EC 81 MG tablet Take 1 tablet (81 mg total) by mouth daily. Swallow whole. 04/26/22   Annita Brod, MD  barrier cream (NON-SPECIFIED) CREA Apply 1 Application topically See admin instructions. Apply topically to buttocks every shift and as needed for each incontinence episode.    [provider]  budesonide-formoterol (SYMBICORT) 160-4.5 MCG/ACT inhaler Inhale 1 puff into the lungs 2 (two) times daily.    [provider]  Cal Carb-Mag Hydrox-Simeth (MYLANTA COAT & COOL) 1200-270-80 MG/10ML SUSP Take 30 mLs by mouth 3 (three) times daily as needed (gas pains).    [provider]  carboxymethylcellulose (REFRESH PLUS) 0.5 % SOLN Place 1 drop into both eyes 3 (three) times daily as needed (dry eyes).    [provider]  cycloSPORINE (RESTASIS) 0.05 % ophthalmic emulsion Place 1 drop into both eyes 2 (two) times daily.     [provider]  dicyclomine (BENTYL) 10 MG capsule Take 1 capsule (10 mg total) by mouth every 6 (six) hours as needed for spasms. 04/08/22   Cirigliano, Vito V, DO  ferrous sulfate 325 (65 FE) MG tablet Take 1 tablet (325 mg total) by mouth daily with breakfast. 02/19/22 04/21/22  Antonieta Pert, MD  fluticasone (FLONASE) 50 MCG/ACT nasal spray Place 1 spray into both nostrils daily as needed for allergies.    [provider]  folic acid (FOLVITE) 1 MG tablet Take 1 tablet (1 mg total) by mouth daily. 04/25/22 05/25/22  Annita Brod, MD  hydrOXYzine (ATARAX) 25 MG tablet Take 25 mg by mouth 2 (two) times daily as needed for itching.    [provider]  isosorbide mononitrate (IMDUR) 30 MG 24 hr tablet Take 1 tablet (30 mg total) by mouth daily. 04/25/22   Annita Brod, MD  Lido-PE-Glycerin-Petrolatum (PREPARATION H RAPID RELIEF RE) Place 1 spray rectally every 6 (six) hours as needed (hemorrhoids).    [provider]  lidocaine 4 % Place 1 patch onto the skin daily as needed (pain).    [provider]  methocarbamol (ROBAXIN) 500 MG tablet Take 500 mg by mouth every 6 (six) hours as needed for muscle spasms.    [provider]  naloxegol oxalate (MOVANTIK) 12.5 MG TABS tablet Take 1 tablet (12.5 mg total) by mouth daily. 04/08/22   Cirigliano, Vito V, DO  nitroGLYCERIN (NITROSTAT) 0.4 MG SL tablet Place 1 tablet (0.4 mg total) under the tongue every 5 (five) minutes as needed for chest pain. Patient taking differently: Place 0.4 mg under the tongue every 5 (five) minutes x 3 doses as needed for chest pain. 11/13/16 04/21/22  Erlene Quan, PA-C  Nystatin (GERHARDT'S BUTT CREAM) CREA Apply 1 Application topically 2 (two) times daily. 04/25/22   Annita Brod, MD  ondansetron (ZOFRAN) 4 MG tablet Take 4 mg by mouth every 8 (eight) hours as needed for nausea or vomiting.    [provider]  oxyCODONE (OXY IR/ROXICODONE) 5 MG immediate  release tablet Take 1 tablet (5 mg total) by mouth every 6 (six) hours as needed for severe pain. 04/25/22   Annita Brod, MD  OXYGEN Inhale 2-3 L/min into the lungs continuous.    [provider]  oxymetazoline (AFRIN) 0.05 % nasal spray Place 1 spray into both nostrils 2 (two) times daily as needed for congestion.    [provider]  pantoprazole (PROTONIX) 40 MG tablet Take 1 tablet (40 mg total) by mouth 2 (two) times daily. 07/08/19   [name], MD  polyethylene glycol powder (GLYCOLAX/MIRALAX) 17 GM/SCOOP powder Take 17 g by mouth daily as needed (constipation).    [provider]  rosuvastatin (CRESTOR) 40 MG tablet Take 1 tablet (40 mg total) by mouth every evening. 04/25/22   Annita Brod, MD  simethicone (MYLICON) 80 MG chewable tablet Chew 80 mg by mouth 4 (four) times daily as needed for flatulence.    [provider]  sucralfate (CARAFATE) 1 GM/10ML suspension Take 10 mLs (1 g total) by mouth 4 (four) times daily -  with meals and at bedtime. 03/24/22   Elgergawy, Silver Huguenin, MD  ticagrelor (BRILINTA) 90 MG TABS tablet Take 1 tablet (90 mg total) by mouth 2 (two) times daily. 04/25/22   Annita Brod, MD  vitamin B-12 (CYANOCOBALAMIN) 1000 MCG tablet Take 1 tablet (1,000 mcg total) by mouth daily. 08/15/21   Caren Griffins, MD    Physical Exam: Vitals:   05/11/22 1928 05/11/22 1930 05/11/22 2000 05/11/22 2030  BP:  121/62 130/72 (!) 141/70  Pulse: 83   (!) 127  Resp: 20 20 19 20   Temp:      TempSrc:      SpO2: 100%     Weight:      Height:       Constitutional: Obese woman resting supine in bed, NAD,  calm, comfortable Eyes: EOMI, lids and conjunctivae normal ENMT: Mucous membranes are moist. Posterior pharynx clear of any exudate or lesions.Normal dentition.  Neck: normal, supple, no masses. Respiratory: clear to auscultation bilaterally, no wheezing, no crackles. Normal respiratory effort while on 3 L O2 via [place]. No  accessory muscle use.  Cardiovascular: Regular rate and rhythm, no murmurs / rubs / gallops. No extremity edema. 2+ pedal pulses. Abdomen: Epigastric tenderness, no masses palpated. Musculoskeletal: no clubbing / cyanosis. No joint deformity upper and lower extremities. Good ROM, no contractures. Normal muscle tone.  Skin: Laceration second left toe with dried blood Neurologic: Sensation intact. Strength 5/5 in all 4.  Psychiatric:  Alert and oriented x 3. Normal mood.   EKG: Not performed.  Assessment/Plan Principal Problem:   Acute renal failure superimposed on stage 4 chronic kidney disease (HCC) Active Problems:   Pancreatic lesion   UTI (urinary tract infection)   Chronic hypoxemic respiratory failure (HCC)   Asthma without acute exacerbation   Hypokalemia   CHB (complete heart block) (HCC)   Paroxysmal atrial fibrillation (HCC)   Thrombocytopenia ([place])   Diabetes mellitus without complication ([place])   Sleep apnea   Mixed hypercholesterolemia and hypertriglyceridemia   Hypertension   Chronic combined systolic and diastolic CHF (congestive heart failure) (HCC)   Coronary artery disease   Hepatic lesion   [name] is a 77 y.o. female with medical history significant for chronic combined systolic and diastolic CHF (EF XX123456 %), CHB s/p CRT-D, CKD stage IV, CAD s/p CABG, PAF not on anticoagulation, chronic respiratory failure with hypoxia on 3 L [place], asthma, T2DM, HTN, HLD, chronic thrombocytopenia, OSA intolerant to CPAP who is admitted with AKI on CKD stage IV.  Assessment and Plan: Acute renal failure superimposed on CKD stage IV: Creatinine up to 3.61 compared to baseline 2.0-2.2.  She reports good urine output.  UA suggestive of UTI.  Appears largely euvolemic on admission.  Does not appear to be on any offending medications. -S/p 1 L LR in the ED, hold further fluids and repeat labs in a.m. -Obtain renal ultrasound -Follow urine studies -Monitor UOP  1.4 cm  pancreatic head lesion Innumerable new hepatic hypodensities: Findings noted on CT imaging.  Pancreatic head lesion concerning for primary pancreatic neoplasm and hepatic lesions suspicious for metastatic disease.  Patient is aware of findings.  Likely will need oncology/GI follow-up to determine further workup/neck steps.  Will hold Brilinta for now in case she needs biopsy soon.  Urinary tract infection: Patient does report dysuria with abnormal urinalysis.  Continue IV ceftriaxone and follow urine culture.  Chronic combined systolic and diastolic CHF CHB s/p CRT-D: Appears euvolemic on admission.  Does not appear to be on diuretics or usual GDMT as an outpatient.  Paroxysmal atrial fibrillation: Low burden A-fib mostly in paced rhythm chronically.  Not on anticoagulation.  Recently placed on aspirin/Brilinta. -Continue aspirin -Hold Brilinta for now in case she needs biopsy  CAD s/p CABG: Stable, denies chest pain.  Continue Imdur and aspirin.  Holding diltiazem block.  Hypokalemia: Oral and IV supplementation initiated.  Give additional oral supplement.  Magnesium is 2.2.  Recent admit for expressive aphasia/TIA?: She was started on DAPT with aspirin/Brilinta x 4 weeks to be followed by aspirin alone for presumed CVA.  Workup was limited -cannot obtain MRI due to pacemaker and could not obtain CTA due to contrast allergy. -Continue aspirin and rosuvastatin -Holding Brilinta for now as above  Thrombocytopenia: Stable, continue to monitor.  2.2  cm right renal lower pole exophytic lesion: Seen again on CT imaging.  Obtain renal ultrasound as above.  Chronic respiratory failure with hypoxia Asthma: Stable on home 3 L O2 via [place].  No wheezing.  Continue Dulera and albuterol as needed.  Laceration left second toe: X-ray negative for evidence of fracture or dislocation.  Continue wound care.  Type 2 diabetes: Well-controlled with last A1c 5.8% on 04/22/2022.  Placed on sensitive  SSI.  Hypertension: Continue Imdur.  Hyperlipidemia: Continue rosuvastatin.  Obstructive sleep apnea: Intolerant of CPAP.  Continue supplemental O2 via [place].   DVT prophylaxis: SCDs Start: 05/11/22 2143 Code Status: Full code, confirmed with patient on admission. Family Communication: Discussed with patient, she has discussed with family Disposition Plan: From home, dispo pending clinical progress Consults called: None Severity of Illness: The appropriate patient status for this patient is INPATIENT. Inpatient status is judged to be reasonable and necessary in order to provide the required intensity of service to ensure the patient's safety. The patient's presenting symptoms, physical exam findings, and initial radiographic and laboratory data in the context of their chronic comorbidities is felt to place them at high risk for further clinical deterioration. Furthermore, it is not anticipated that the patient will be medically stable for discharge from the hospital within 2 midnights of admission.   * I certify that at the point of admission it is my clinical judgment that the patient will require inpatient hospital care spanning beyond 2 midnights from the point of admission due to high intensity of service, high risk for further deterioration and high frequency of surveillance required.Zada Finders MD [facility]  If 7PM-7AM, please contact night-coverage [email or web address]  05/11/2022, 10:52 PM

## 2022-05-11 NOTE — ED Triage Notes (Signed)
Pt BIB EMS and coming from home. She presents with wounds to her second and third digits of her left foot. Patient also described to EMS UTI symptoms.  She reports painful urination and increased urination.  Patient was recently at a skilled nursing facility post hip surgery. She for reason left the facility to go home.  She lives at home by herself and is not taking care of herself. EMS reported she was lying in her own filth.

## 2022-05-12 ENCOUNTER — Encounter (HOSPITAL_COMMUNITY): Payer: Self-pay | Admitting: Internal Medicine

## 2022-05-12 ENCOUNTER — Inpatient Hospital Stay (HOSPITAL_COMMUNITY): Payer: 59

## 2022-05-12 DIAGNOSIS — N179 Acute kidney failure, unspecified: Secondary | ICD-10-CM | POA: Diagnosis not present

## 2022-05-12 DIAGNOSIS — E440 Moderate protein-calorie malnutrition: Secondary | ICD-10-CM | POA: Insufficient documentation

## 2022-05-12 DIAGNOSIS — N184 Chronic kidney disease, stage 4 (severe): Secondary | ICD-10-CM | POA: Diagnosis not present

## 2022-05-12 LAB — COMPREHENSIVE METABOLIC PANEL
ALT: 22 U/L (ref 0–44)
AST: 26 U/L (ref 15–41)
Albumin: 2 g/dL — ABNORMAL LOW (ref 3.5–5.0)
Alkaline Phosphatase: 395 U/L — ABNORMAL HIGH (ref 38–126)
Anion gap: 8 (ref 5–15)
BUN: 57 mg/dL — ABNORMAL HIGH (ref 8–23)
CO2: 23 mmol/L (ref 22–32)
Calcium: 8.4 mg/dL — ABNORMAL LOW (ref 8.9–10.3)
Chloride: 105 mmol/L (ref 98–111)
Creatinine, Ser: 3.26 mg/dL — ABNORMAL HIGH (ref 0.44–1.00)
GFR, Estimated: 14 mL/min — ABNORMAL LOW (ref >60)
Glucose, Bld: 114 mg/dL — ABNORMAL HIGH (ref 70–99)
Potassium: 3.1 mmol/L — ABNORMAL LOW (ref 3.5–5.1)
Sodium: 136 mmol/L (ref 135–145)
Total Bilirubin: 0.4 mg/dL (ref 0.3–1.2)
Total Protein: 5 g/dL — ABNORMAL LOW (ref 6.5–8.1)

## 2022-05-12 LAB — CBC
HCT: 32.2 % — ABNORMAL LOW (ref 36.0–46.0)
Hemoglobin: 10.1 g/dL — ABNORMAL LOW (ref 12.0–15.0)
MCH: 28.1 pg (ref 26.0–34.0)
MCHC: 31.4 g/dL (ref 30.0–36.0)
MCV: 89.4 fL (ref 80.0–100.0)
Platelets: 81 10*3/uL — ABNORMAL LOW (ref 150–400)
RBC: 3.6 MIL/uL — ABNORMAL LOW (ref 3.87–5.11)
RDW: 19.2 % — ABNORMAL HIGH (ref 11.5–15.5)
WBC: 9.4 10*3/uL (ref 4.0–10.5)
nRBC: 0 % (ref 0.0–0.2)

## 2022-05-12 LAB — GLUCOSE, CAPILLARY
Glucose-Capillary: 103 mg/dL — ABNORMAL HIGH (ref 70–99)
Glucose-Capillary: 157 mg/dL — ABNORMAL HIGH (ref 70–99)
Glucose-Capillary: 184 mg/dL — ABNORMAL HIGH (ref 70–99)
Glucose-Capillary: 122 mg/dL — ABNORMAL HIGH (ref 70–99)

## 2022-05-12 MED ORDER — SODIUM CHLORIDE 0.9 % IV SOLN: INTRAVENOUS | Status: AC

## 2022-05-12 MED ORDER — ORAL CARE MOUTH RINSE: 15.0000 mL | OROMUCOSAL | Status: DC | PRN

## 2022-05-12 MED ORDER — HYDROXYZINE HCL 25 MG PO TABS
25.0000 mg | ORAL_TABLET | Freq: Three times a day (TID) | ORAL | Status: DC | PRN
  Administered 2022-05-12 – 2022-05-16 (×3): 25 mg via ORAL
  Filled 2022-05-12 (×3): qty 1

## 2022-05-12 MED ORDER — PROSOURCE PLUS PO LIQD
30.0000 mL | Freq: Two times a day (BID) | ORAL | Status: DC
  Administered 2022-05-14: 30 mL via ORAL
  Filled 2022-05-12 (×4): qty 30

## 2022-05-12 MED ORDER — POTASSIUM CHLORIDE CRYS ER 20 MEQ PO TBCR
40.0000 meq | EXTENDED_RELEASE_TABLET | ORAL | Status: AC
  Administered 2022-05-12 (×2): 40 meq via ORAL
  Filled 2022-05-12 (×2): qty 2

## 2022-05-12 MED ORDER — LIP MEDEX EX OINT
TOPICAL_OINTMENT | CUTANEOUS | Status: DC | PRN
  Administered 2022-05-12: 75 via TOPICAL
  Filled 2022-05-12: qty 7

## 2022-05-12 NOTE — Progress Notes (Signed)
RT note: Pt. declined CPAP- 05/12/2022, states she does not use.

## 2022-05-12 NOTE — Progress Notes (Signed)
PT Cancellation Note  Patient Details Name: [name] MRN: [id number] DOB: Mar 31, 1945   Cancelled Treatment:    Reason Eval/Treat Not Completed:  (Order received. Chart reviewed. Will hold PT for now-orthopedics has ordered an xray of the hip (surgery 02/12/2022). Will proceed with PT eval after results, if okay with MD.    Doreatha Massed, PT Acute Rehabilitation  Office: [phone]

## 2022-05-12 NOTE — Progress Notes (Signed)
PROGRESS NOTE    [name]  [id number] DOB: 1945-04-21 DOA: 05/11/2022 PCP: Lujean Amel, MD    Brief Narrative:   [name] is a 77 y.o. female with past medical history significant for chronic combined systolic and diastolic CHF (EF [id number] %), CHB s/p CRT-D, CKD stage IV, CAD s/p CABG, PAF not on anticoagulation, chronic respiratory failure with hypoxia on 3 L [place], asthma, T2DM, HTN, HLD, chronic thrombocytopenia, OSA intolerant to CPAP who presented to the ED for evaluation of abdominal pain and dysuria. Patient states that she has been having 1 day of epigastric abdominal pain without nausea or vomiting.  She has had dysuria as well.  She denies any diarrhea, fevers, chills, diaphoresis.  She has had some exertional dyspnea.  Denies chest pain.  She says she hit her left foot resulting in a laceration to her second left toe which has been bleeding.   Patient recently admitted 04/21/2022-04/25/2022 for encephalopathy and expressive aphasia.  CT head was negative x 2 for any acute stroke.  MRI unable to be performed due to pacemaker.  CTA not performed due to contrast allergy.  Transcranial and carotid Dopplers were unremarkable.  Her Plavix was discontinued and she was switched to DAPT with aspirin and Brilinta for 4 weeks to be followed by aspirin alone.  PT/OT recommended SNF however insurance denied it and patient returned back to long-term care.   Patient reportedly checked herself out of her long-term care facility and has been at home mostly immobile on the couch unable to care for herself.  She admits that it was a mistake to have left the facility.   In the ED, BP 111/72, pulse 87, RR 17, temp 97.9 F, SpO2 96% on room air. WBC 11.0, hemoglobin 11.5, platelets 93,000, sodium 136, potassium 2.1, bicarb 23, BUN 60, creatinine 3.61, serum glucose 128, lactic acid 1.3.  Urinalysis shows negative nitrites, large leukocytes, 0-5 RBCs, >50 WBCs, many bacteria on microscopy. Left  foot x-ray negative for fracture or dislocation. Chest x-ray shows cardiomegaly, prior CABG changes, no active disease. CT abdomen/pelvis without contrast shows innumerable new hepatic hypodensities measuring up to 3.0 cm suspicious for metastatic disease.  Ill-defined 1.4 cm hypoattenuating focus within the pancreatic head is seen, suspicious for primary pancreatic neoplasm.  Lytic focus along medial left acetabulum measuring up to 2.3 cm at the site of prior compression screw may be postsurgical.  Indeterminate 2.2 cm right renal lower pole exophytic lesion noted. Patient was given IV ceftriaxone, 1 L LR, IV K 10 mEq x 1, oral K 40 mEq, Mag-Ox 800 mg.  The hospitalist service was consulted to admit for further evaluation and management.  Assessment & Plan:   Acute renal failure superimposed on CKD stage IV: Creatinine on admission 3.61 with baseline 2.0-2.2.  Appears largely euvolemic on admission.  Does not appear to be on any offending medications. CT abdomen/pelvis with no hydronephrosis.  Renal ultrasound with bilateral increased renal cortical echotexture consistent with medical renal disease, bilateral simple appearing renal cyst. -- Cr 3.61>3.26 -- Continue NS at 75 mL/h -- Continue monitor urine output -- BMP daily   1.4 cm pancreatic head lesion Innumerable new hepatic hypodensities: Findings noted on CT imaging.  Pancreatic head lesion concerning for primary pancreatic neoplasm and hepatic lesions suspicious for metastatic disease.  Discussed findings with patient.   -- AFP, CA 19-9: Pending -- Will need outpatient follow-up with [facility], medical oncology   Urinary tract infection: Urinalysis with large leukocytes, negative  nitrite, many bacteria, greater than 50 WBCs.  -- Urine culture: Pending -- Ceftriaxone 1 g IV every 24 hours   Chronic combined systolic and diastolic CHF CHB s/p CRT-D: Appears euvolemic on admission.  Does not appear to be on diuretics or usual GDMT as an  outpatient.   Paroxysmal atrial fibrillation: Low burden A-fib mostly in paced rhythm chronically.  Not on anticoagulation.  Recently placed on aspirin/Brilinta. -- Continue aspirin -- Hold Brilinta for now in case she needs biopsy   CAD s/p CABG: Stable, denies chest pain.   -- Continue Imdur and aspirin.     Hypokalemia: Potassium 3.1, will continue repletion. - -Repeat electrolytes in a.m. to include magnesium   Recent admit for expressive aphasia/TIA?: She was started on DAPT with aspirin/Brilinta x 4 weeks to be followed by aspirin alone for presumed CVA.  Workup was limited.  Unable to obtain MRI due to pacemaker and could not obtain CTA due to contrast allergy. -- Continue aspirin and rosuvastatin -- Holding Brilinta for now as above   Thrombocytopenia: Stable, continue to monitor.   Renal cyst: Seen again on CT imaging.  Renal ultrasound with bilateral simple appearing renal cysts, no follow-up required.   Chronic respiratory failure with hypoxia Asthma: Stable on home 3 L O2 via [place].  No wheezing.  Continue Dulera and albuterol as needed.   Laceration left second toe: X-ray negative for evidence of fracture or dislocation.  Continue wound care.   Type 2 diabetes: Well-controlled with last A1c 5.8% on 04/22/2022.   -- sensitive SSI for coverage. -- CBG before every meal/at bedtime   Hypertension: Continue Imdur 30 mg p.o. daily.   Hyperlipidemia: Continue rosuvastatin 10 mg p.o. nightly.   Obstructive sleep apnea: Intolerant of CPAP.  Continue supplemental O2 via [place] at 3L [place].  Weakness/debility/deconditioning: Patient previously a resident at a long-term care facility; now residing at home alone. --PT/OT evaluation: Pending   DVT prophylaxis: SCDs Start: 05/11/22 2143    Code Status: Full Code Family Communication: No family present at bedside this morning  Disposition Plan:  Level of care: Med-Surg Status is: Inpatient Remains inpatient appropriate  because: IV fluid hydration, IV antibiotics, anticipate likely need of SNF placement    Consultants:  None  Procedures:  Renal ultrasound  Antimicrobials:  Ceftriaxone 3/31>>   Subjective: Patient seen examined bedside, resting comfortably.  Lying in bed.  No specific complaints this morning other than generalized weakness.  Denies headache, no fever/chills/night sweats, no nausea cefonicid diarrhea, no chest pain, no palpitations, no shortness of breath more than her typical baseline, no abdominal pain, no focal weakness, no fatigue, no paresthesias.  No acute events overnight per nursing staff.  Objective: Vitals:   05/11/22 2030 05/12/22 0230 05/12/22 0652 05/12/22 1209  BP: (!) 141/70 (!) 107/56 120/60 (!) 110/55  Pulse: (!) 127 72 74 75  Resp: 20 18 20 16   Temp:  97.8 F (36.6 C) 98 F (36.7 C) 97.8 F (36.6 C)  TempSrc:    Oral  SpO2:  100% 98% 100%  Weight:      Height:        Intake/Output Summary (Last 24 hours) at 05/12/2022 1434 Last data filed at 05/12/2022 0700 Gross per 24 hour  Intake 1340 ml  Output 200 ml  Net 1140 ml   Filed Weights   05/11/22 1536  Weight: 71.2 kg    Examination:  Physical Exam: GEN: NAD, alert and oriented x 3 HEENT: NCAT, PERRL, EOMI, sclera clear,  MMM PULM: CTAB w/o wheezes/crackles, normal respiratory effort, on 3 L nasal cannula which is her baseline with SpO2 95% at rest CV: RRR w/o M/G/R GI: abd soft, NTND, NABS, no R/G/M MSK: no peripheral edema, moves all extremities independently NEURO: CN II-XII intact, no focal deficits, sensation to light touch intact PSYCH: normal mood/affect Integumentary: Left foot with dressing in place with slight blood strikethrough, otherwise clean/dry/intact, stage I sacral ulcer noted, otherwise no other concerning rashes/lesions/wounds noted on exposed skin surfaces    Data Reviewed: I have personally reviewed following labs and imaging studies  CBC: Recent Labs  Lab 05/11/22 1601  05/12/22 0423  WBC 11.0* 9.4  NEUTROABS 9.0*  --   HGB 11.5* 10.1*  HCT 36.7 32.2*  MCV 89.5 89.4  PLT 93* 81*   Basic Metabolic Panel: Recent Labs  Lab 05/11/22 1601 05/12/22 0423  NA 136 136  K 2.1* 3.1*  CL 102 105  CO2 23 23  GLUCOSE 128* 114*  BUN 60* 57*  CREATININE 3.61* 3.26*  CALCIUM 8.8* 8.4*  MG 2.2  --    GFR: Estimated Creatinine Clearance: 13.4 mL/min (A) (by C-G formula based on SCr of 3.26 mg/dL (H)). Liver Function Tests: Recent Labs  Lab 05/11/22 2258 05/12/22 0423  AST 41 26  ALT 24 22  ALKPHOS 455* 395*  BILITOT 1.0 0.4  PROT 6.3* 5.0*  ALBUMIN 2.4* 2.0*   Recent Labs  Lab 05/11/22 2258  LIPASE 73*   No results for input(s): "AMMONIA" in the last 168 hours. Coagulation Profile: No results for input(s): "INR", "PROTIME" in the last 168 hours. Cardiac Enzymes: No results for input(s): "CKTOTAL", "CKMB", "CKMBINDEX", "TROPONINI" in the last 168 hours. BNP (last 3 results) No results for input(s): "PROBNP" in the last 8760 hours. HbA1C: No results for input(s): "HGBA1C" in the last 72 hours. CBG: Recent Labs  Lab 05/11/22 2151 05/12/22 0744 05/12/22 1206  GLUCAP 144* 103* 157*   Lipid Profile: No results for input(s): "CHOL", "HDL", "LDLCALC", "TRIG", "CHOLHDL", "LDLDIRECT" in the last 72 hours. Thyroid Function Tests: No results for input(s): "TSH", "T4TOTAL", "FREET4", "T3FREE", "THYROIDAB" in the last 72 hours. Anemia Panel: No results for input(s): "VITAMINB12", "FOLATE", "FERRITIN", "TIBC", "IRON", "RETICCTPCT" in the last 72 hours. Sepsis Labs: Recent Labs  Lab 05/11/22 1610  LATICACIDVEN 1.3    No results found for this or any previous visit (from the past 240 hour(s)).       Radiology Studies: DG HIP UNILAT W OR W/O PELVIS 2-3 VIEWS LEFT  Result Date: 05/12/2022 CLINICAL DATA:  Postoperative left hip surgery 02/13/2022 EXAM: DG HIP (WITH OR WITHOUT PELVIS) 2-3V LEFT COMPARISON:  CT abdomen pelvis 05/11/2022, left  hip radiographs 02/17/2022 FINDINGS: Redemonstration of total left hip arthroplasty with moderately long femoral stem. Lateral hook plate and cerclage wiring around the proximal to mid aspects of the femoral stem, similar to prior. No perihardware lucency is seen to indicate hardware failure or loosening. Mild superolateral right femoroacetabular joint space narrowing and peripheral osteophytosis. IMPRESSION: 1. Status post total left hip arthroplasty without evidence of hardware failure. 2. Mild right femoroacetabular osteoarthritis. Electronically Signed   By: Yvonne Kendall M.D.   On: 05/12/2022 14:24   US RENAL  Result Date: 05/11/2022 CLINICAL DATA:  Acute on chronic renal insufficiency EXAM: RENAL / URINARY TRACT ULTRASOUND COMPLETE COMPARISON:  05/11/2022 FINDINGS: Right Kidney: Renal measurements: 10.0 x 4.6 x 4.7 cm = volume: 111.4 mL. Increased renal cortical echotexture consistent with medical renal disease. No hydronephrosis or  nephrolithiasis. There are 2 simple renal cysts identified, largest in the lower pole measuring 2.7 x 2.0 x 2.3 cm, and a smaller in the upper pole measuring 2.2 x 1.4 x 1.7 cm. Left Kidney: Renal measurements: 8.8 x 4.2 by 4.9 cm = volume: 93.9 mL. Increased renal cortical echotexture. No hydronephrosis or nephrolithiasis. Exophytic simple cyst off the mid left kidney measures 1.7 by 1.3 by 1.5 cm. There is also a 1.3 cm simple cortical cyst lower pole left kidney. Bladder: Appears normal for degree of bladder distention. Other: None. IMPRESSION: 1. Bilateral increased renal cortical echotexture consistent with medical renal disease. 2. Bilateral simple appearing renal cysts. No specific imaging follow-up is required. Electronically Signed   By: Randa Ngo M.D.   On: 05/11/2022 23:20   CT ABDOMEN PELVIS WO CONTRAST  Result Date: 05/11/2022 CLINICAL DATA:  Epigastric pain EXAM: CT ABDOMEN AND PELVIS WITHOUT CONTRAST TECHNIQUE: Multidetector CT imaging of the abdomen and  pelvis was performed following the standard protocol without IV contrast. RADIATION DOSE REDUCTION: This exam was performed according to the departmental dose-optimization program which includes automated exposure control, adjustment of the mA and/or kV according to patient size and/or use of iterative reconstruction technique. COMPARISON:  CT abdomen and pelvis dated 03/17/2022 FINDINGS: Lower chest: No focal consolidation or pulmonary nodule in the lung bases. No pleural effusion or pneumothorax demonstrated. Partially imaged heart size is normal. Partially imaged median sternotomy wires are nondisplaced. Hepatobiliary: Innumerable new hepatic hypodensities measuring up to 3.0 cm (2:16). No intra or extrahepatic biliary ductal dilation. Cholecystectomy. Pancreas: Ill-defined 1.4 cm hypoattenuating focus within the pancreatic head (2:29), not well seen on prior examination. No main pancreatic ductal dilation. Spleen: Normal in size without focal abnormality. Adrenals/Urinary Tract: No adrenal nodules. Bilateral renal cortical thinning. No hydronephrosis or calculi. 2.2 cm right lower pole exophytic lesion does not demonstrate simple fluid attenuation. Additional bilateral simple-appearing renal cysts. No specific follow-up imaging recommended. No focal bladder wall thickening. Stomach/Bowel: Normal appearance of the stomach. No evidence of bowel wall thickening, distention, or inflammatory changes. Colonic diverticulosis without acute diverticulitis. Appendix is not discretely seen. Vascular/Lymphatic: Aortic atherosclerosis. Retroaortic left renal vein. No enlarged abdominal or pelvic lymph nodes. Reproductive: No adnexal masses. Other: Mild presacral edema.  No free air. Musculoskeletal: Lytic focus along the medial left acetabulum (2:66) measuring 2.3 cm at the site of prior compression screw. Status post left hip arthroplasty. Postsurgical changes from partially imaged thoracic spinal fixation. Unchanged  fracture of T9. Multilevel degenerative changes of the partially imaged thoracic and lumbar spine. Unchanged grade 1 anterolisthesis at L5-S1. IMPRESSION: 1. Innumerable new hepatic hypodensities measuring up to 3.0 cm suspicious for metastatic disease. 2. Ill-defined 1.4 cm hypoattenuating focus within the pancreatic head, not well seen on prior examination, suspicious for primary pancreatic neoplasm. 3. Lytic focus along the medial left acetabulum measuring up to 2.3 cm at the site of prior compression screw may be postsurgical. 4. Indeterminate 2.2 cm right renal lower pole exophytic lesion. Given the patient's history of contraindication to MRI and contrast-enhanced CT, ultrasound of the kidneys can be considered for further characterization. 5.  Aortic Atherosclerosis (ICD10-I70.0). These results were called by telephone at the time of interpretation on 05/11/2022 at 7:37 pm to provider [facility] , who verbally acknowledged these results. Electronically Signed   By: Darrin Nipper M.D.   On: 05/11/2022 19:41   DG Chest Portable 1 View  Result Date: 05/11/2022 CLINICAL DATA:  Shortness of breath EXAM: PORTABLE CHEST 1 VIEW  COMPARISON:  04/21/2022 FINDINGS: Cardiomegaly. Prior CABG. Right pacer remains in place, unchanged. No confluent airspace opacities, effusions or edema. Aortic atherosclerosis. No acute bony abnormality. IMPRESSION: Cardiomegaly.  No active disease. Electronically Signed   By: Rolm Baptise M.D.   On: 05/11/2022 17:50   DG Foot Complete Left  Result Date: 05/11/2022 CLINICAL DATA:  Wounds to the second and third digits EXAM: LEFT FOOT - COMPLETE 3 VIEW COMPARISON:  None Available. FINDINGS: There is no definite evidence of fracture or dislocation. Old fracture of the partially imaged distal fibula. Degenerative changes of the foot. Soft tissues are unremarkable. IMPRESSION: 1. No definite evidence of fracture or dislocation. 2. Old fracture of the partially imaged distal fibula. 3. No  soft tissue abnormality to correspond to reported wounds. Electronically Signed   By: Darrin Nipper M.D.   On: 05/11/2022 17:49        Scheduled Meds:  (feeding supplement) PROSource Plus  30 mL Oral BID BM   aspirin EC  81 mg Oral Daily   insulin aspart  0-9 Units Subcutaneous TID WC   isosorbide mononitrate  30 mg Oral Daily   mometasone-formoterol  2 puff Inhalation BID   pantoprazole  40 mg Oral BID AC   rosuvastatin  10 mg Oral QPM   sodium chloride flush  3 mL Intravenous Q12H   Continuous Infusions:  sodium chloride 75 mL/hr at 05/12/22 1055   cefTRIAXone (ROCEPHIN)  IV       LOS: 1 day    Time spent: 51 minutes spent on chart review, discussion with nursing staff, consultants, updating family and interview/physical exam; more than 50% of that time was spent in counseling and/or coordination of care.    Shakerra Red J [place] DO [facility] Available via Epic secure chat 7am-7pm After these hours, please refer to coverage provider listed on [email or web address] 05/12/2022, 2:34 PM

## 2022-05-12 NOTE — Evaluation (Signed)
Occupational Therapy Evaluation Patient Details Name: [name] MRN: [id number] DOB: 02-Dec-1945 Today's Date: 05/12/2022   History of Present Illness Patient is a 77 year old female who presented on 3/31 with epigastric pain. Patient was found at home unable to take care of her self per MD note. Patient was admitted with acute renal failure superimposed on CKD IV, 1.4 cm pancreatic head lesion, innumerable new hepatic hypodensities, UTI. PMH: recent hospitalization 04/21/2022-04/25/2022 for encephalopathy and expressive aphasia, CAD s/p CABG, hypokalemia, COVID 19, a fib,   Clinical Impression   Patient is a 77 year old female who was admitted for above. Patient was living at home after leaving SNF with inability to care for herself. Currently, evaluation was limited to bed level with patient self limiting session participation. Patient was noted to have decreased functional activity tolernace, decreased endurance, decreased sitting balance, decreased standing balanced, decreased safety awareness, and decreased knowledge of AE/AD impacting participation in ADLs. Patient would continue to benefit from skilled OT services at this time while admitted and after d/c to address noted deficits in order to improve overall safety and independence in ADLs.        Recommendations for follow up therapy are one component of a multi-disciplinary discharge planning process, led by the attending physician.  Recommendations may be updated based on patient status, additional functional criteria and insurance authorization.   Assistance Recommended at Discharge Frequent or constant Supervision/Assistance  Patient can return home with the following Two people to help with walking and/or transfers;Two people to help with bathing/dressing/bathroom;Direct supervision/assist for financial management;Help with stairs or ramp for entrance;Assist for transportation;Direct supervision/assist for medications  management;Assistance with cooking/housework    Functional Status Assessment  Patient has had a recent decline in their functional status and demonstrates the ability to make significant improvements in function in a reasonable and predictable amount of time.  Equipment Recommendations  Other (comment) (defer to next level of care)       Precautions / Restrictions Precautions Precautions: Fall Restrictions Weight Bearing Restrictions: No LLE Weight Bearing: Weight bearing as tolerated Other Position/Activity Restrictions: per secure chat with Dr.Marchwiany, patient is now WBAT and has no precautions      Mobility Bed Mobility Overal bed mobility: Needs Assistance Bed Mobility: Rolling Rolling: Mod assist, Max assist                           ADL either performed or assessed with clinical judgement   ADL Overall ADL's : Needs assistance/impaired     Grooming: Set up;Bed level   Upper Body Bathing: Minimal assistance;Bed level   Lower Body Bathing: Bed level;Total assistance   Upper Body Dressing : Bed level;Minimal assistance   Lower Body Dressing: Bed level;Total assistance     Toilet Transfer Details (indicate cue type and reason): patient declined to sit EOB or attempt to stand reporting she did not want to. patient was able to roll in bed to get cleaned up. Toileting- Clothing Manipulation and Hygiene: Bed level;Total assistance Toileting - Clothing Manipulation Details (indicate cue type and reason): patietn was noted to have voided BM and was needing to get cleaned upon entrance to room. patient was educated importance of getting staff to clean her to reduce risk of skin breakdown. patient reported she had reached out to staff, staff reported she had not reached out. patients staff to check on her for toileting needs more. patient was TD for hygiene tasks  Pertinent Vitals/Pain Pain Assessment Pain Assessment: Faces Faces Pain Scale:  Hurts even more Pain Location: abd, L hip Pain Descriptors / Indicators: Discomfort, Grimacing, Guarding, Moaning Pain Intervention(s): Limited activity within patient's tolerance, Monitored during session, Repositioned     Hand Dominance Right      Communication Communication Communication: No difficulties   Cognition Arousal/Alertness: Awake/alert Behavior During Therapy: WFL for tasks assessed/performed Overall Cognitive Status: No family/caregiver present to determine baseline cognitive functioning         General Comments: patient reported today was april fools day 2025. patient was noted to be self limiting in ability to participate in tasks. patient was found soiled reporting that she had asked staff to help get cleaned. no call light on upon entrance to room. NT and nurse made aware to check on patient                Home Living Family/patient expects to be discharged to:: Skilled nursing facility Living Arrangements: Other (Comment)   Type of Home: House           Additional Comments: patient reported she was living at home. hospital notes report that patient was living on couch and unable to take care of herself at home. notes also indicate that patient had left LTC facility AMA in past.      Prior Functioning/Environment Prior Level of Function : Needs assist       Physical Assist : ADLs (physical)   ADLs (physical): Bathing;Dressing;Toileting;IADLs Mobility Comments: Working with PT at rehab, using a walker for transfers with a little  help and using wheelchair for distance ADLs Comments: has assist with bathing/dressing for LB but only a little assist for UB from bed, using diaper for toileting needs. able to feed self        OT Problem List: Decreased strength;Decreased activity tolerance;Impaired balance (sitting and/or standing);Decreased cognition;Decreased safety awareness;Decreased knowledge of use of DME or AE;Decreased knowledge of  precautions;Obesity      OT Treatment/Interventions: Self-care/ADL training;Therapeutic exercise;DME and/or AE instruction;Therapeutic activities;Cognitive remediation/compensation;Patient/family education;Balance training    OT Goals(Current goals can be found in the care plan section) Acute Rehab OT Goals Patient Stated Goal: none stated OT Goal Formulation: With patient Time For Goal Achievement: 05/26/22 Potential to Achieve Goals: Fair  OT Frequency: Min 2X/week    Co-evaluation PT/OT/SLP Co-Evaluation/Treatment: Yes Reason for Co-Treatment: For patient/therapist safety;To address functional/ADL transfers PT goals addressed during session: Mobility/safety with mobility OT goals addressed during session: ADL's and self-care      AM-PAC OT "6 Clicks" Daily Activity     Outcome Measure Help from another person eating meals?: A Little Help from another person taking care of personal grooming?: A Little Help from another person toileting, which includes using toliet, bedpan, or urinal?: Total Help from another person bathing (including washing, rinsing, drying)?: A Lot Help from another person to put on and taking off regular upper body clothing?: A Lot Help from another person to put on and taking off regular lower body clothing?: Total 6 Click Score: 12   End of Session Nurse Communication: Other (comment) (update on patient voiding BM and not calling out to get cleaned)  Activity Tolerance: Other (comment) (self limting) Patient left: in bed;with call bell/phone within reach;with bed alarm set  OT Visit Diagnosis: Other abnormalities of gait and mobility (R26.89);Muscle weakness (generalized) (M62.81);Other symptoms and signs involving cognitive function                Time: [id number] OT Time  Calculation (min): 17 min Charges:  OT General Charges $OT Visit: 1 Visit OT Evaluation $OT Eval Moderate Complexity: 1 Mod  Madelline Eshbach OTR/L, MS Acute Rehabilitation  Department Office# [phone]   Willa Rough 05/12/2022, 4:06 PM

## 2022-05-12 NOTE — Progress Notes (Signed)
OT Cancellation Note  Patient Details Name: [name] MRN: [id number] DOB: Jan 13, 1946   Cancelled Treatment:    Reason Eval/Treat Not Completed: Other (comment) Order received. Chart reviewed. Will hold OT for now-orthopedics has ordered an xray of the hip (surgery 02/12/2022). Will proceed with OT eval after results, if okay with MD.  Rennie Plowman, MS Acute Rehabilitation Department Office# [phone]  05/12/2022, 1:44 PM

## 2022-05-12 NOTE — Evaluation (Signed)
Physical Therapy Evaluation Patient Details Name: [name] MRN: [id number] DOB: [birth year] Today's Date: 05/12/2022  History of Present Illness  Patient is a 77 year old female who presented on 3/31 with epigastric pain. Patient was found at home unable to take care of her self per MD note. Patient was admitted with acute renal failure superimposed on CKD IV, 1.4 cm pancreatic head lesion, innumerable new hepatic hypodensities, UTI. PMH: recent hospitalization 04/21/2022-04/25/2022 for encephalopathy and expressive aphasia, CAD s/p CABG, hypokalemia, COVID 19, a fib,  Clinical Impression  On eval, pt required Mod A for bed mobility. She refused to attempt sitting EOB. She required total A for hygiene due to bowel incontinence. No family present during session. Will progress activity as pt is able to tolerate and depending on pt's willingness to participate.        Recommendations for follow up therapy are one component of a multi-disciplinary discharge planning process, led by the attending physician.  Recommendations may be updated based on patient status, additional functional criteria and insurance authorization.  Follow Up Recommendations Can patient physically be transported by private vehicle: No     Assistance Recommended at Discharge Frequent or constant Supervision/Assistance  Patient can return home with the following  Assist for transportation;Assistance with cooking/housework;Help with stairs or ramp for entrance;Two people to help with bathing/dressing/bathroom;Two people to help with walking and/or transfers    Equipment Recommendations None recommended by PT  Recommendations for Other Services       Functional Status Assessment Patient has had a recent decline in their functional status and/or demonstrates limited ability to make significant improvements in function in a reasonable and predictable amount of time     Precautions / Restrictions Precautions Precautions:  Fall Restrictions Weight Bearing Restrictions: No LLE Weight Bearing: Weight bearing as tolerated Other Position/Activity Restrictions: per secure chat with Dr.Marchwiany, patient is now WBAT and has no precautions      Mobility  Bed Mobility Overal bed mobility: Needs Assistance Bed Mobility: Rolling Rolling: Mod assist         General bed mobility comments: Mod A to roll to L and R sides. Increased time. Assist for trunk and LEs. Utilized bedpad. Pt refused to sit up EOB.    Transfers                        Ambulation/Gait                  Stairs            Wheelchair Mobility    Modified Rankin (Stroke Patients Only)       Balance                                             Pertinent Vitals/Pain Pain Assessment Pain Assessment: Faces Faces Pain Scale: Hurts even more Pain Location: abd, L hip Pain Descriptors / Indicators: Discomfort, Grimacing, Guarding Pain Intervention(s): Limited activity within patient's tolerance, Monitored during session, Repositioned    Home Living Family/patient expects to be discharged to:: Skilled nursing facility Living Arrangements: Other (Comment)   Type of Home: House             Additional Comments: patient reported she was living at home. hospital notes report that patient was living on couch and unable to take care of herself at home.  notes also indicate that patient had left LTC facility AMA in past.    Prior Function Prior Level of Function : Needs assist       Physical Assist : ADLs (physical)   ADLs (physical): Bathing;Dressing;Toileting;IADLs Mobility Comments: Working with PT at rehab, using a walker for transfers with a little help and using wheelchair for distance ADLs Comments: has assist with bathing/dressing for LB but only a little assist for UB from bed, using diaper for toileting needs. able to feed self     Hand Dominance   Dominant Hand: Right     Extremity/Trunk Assessment   Upper Extremity Assessment Upper Extremity Assessment: Defer to OT evaluation    Lower Extremity Assessment Lower Extremity Assessment: Generalized weakness RLE Deficits / Details: limited pt effort LLE Deficits / Details: limited pt effort. also prior hip sx    Cervical / Trunk Assessment Cervical / Trunk Assessment: Normal  Communication   Communication: No difficulties  Cognition Arousal/Alertness: Awake/alert Behavior During Therapy: WFL for tasks assessed/performed Overall Cognitive Status: No family/caregiver present to determine baseline cognitive functioning                   Orientation Level: Disoriented to, Time             General Comments: patient reported today was april fools day 2025. patient was noted to be self limiting in ability to participate in tasks. patient was found soiled reporting that she had asked staff to help get cleaned--no call light on upon entrance to room.        General Comments      Exercises     Assessment/Plan    PT Assessment Patient needs continued PT services  PT Problem List Decreased strength;Decreased activity tolerance;Decreased balance;Decreased mobility;Decreased range of motion;Pain;Obesity;Decreased knowledge of use of DME       PT Treatment Interventions Functional mobility training;Therapeutic activities;Therapeutic exercise;Balance training;Patient/family education;DME instruction    PT Goals (Current goals can be found in the Care Plan section)  Acute Rehab PT Goals Patient Stated Goal: to go to SNF PT Goal Formulation: With patient Time For Goal Achievement: 05/26/22 Potential to Achieve Goals: Fair    Frequency Min 2X/week     Co-evaluation   Reason for Co-Treatment: For patient/therapist safety;To address functional/ADL transfers PT goals addressed during session: Mobility/safety with mobility OT goals addressed during session: ADL's and self-care        AM-PAC PT "6 Clicks" Mobility  Outcome Measure Help needed turning from your back to your side while in a flat bed without using bedrails?: A Lot Help needed moving from lying on your back to sitting on the side of a flat bed without using bedrails?: A Lot Help needed moving to and from a bed to a chair (including a wheelchair)?: A Lot Help needed standing up from a chair using your arms (e.g., wheelchair or bedside chair)?: A Lot Help needed to walk in hospital room?: Total Help needed climbing 3-5 steps with a railing? : Total 6 Click Score: 10    End of Session Equipment Utilized During Treatment: Oxygen Activity Tolerance: Patient limited by pain Patient left: in bed;with call bell/phone within reach;with bed alarm set   PT Visit Diagnosis: Muscle weakness (generalized) (M62.81);Difficulty in walking, not elsewhere classified (R26.2);Pain Pain - Right/Left: Left Pain - part of body: Hip    Time: [id number] PT Time Calculation (min) (ACUTE ONLY): 17 min   Charges:   PT Evaluation $PT Eval Low Complexity: 1 Low  [place] [facility]  Office: [phone]

## 2022-05-12 NOTE — Progress Notes (Addendum)
Initial Nutrition Assessment  DOCUMENTATION CODES:   Non-severe (moderate) malnutrition in context of acute illness/injury  INTERVENTION:  Encouraged po intake  Prosource BID  Educated patient about adequate nutrition  Educated patient on how to order meals    NUTRITION DIAGNOSIS:   Moderate Malnutrition related to acute illness as evidenced by mild fat depletion, mild muscle depletion, moderate muscle depletion, percent weight loss.   GOAL:   Patient will meet greater than or equal to 90% of their needs   MONITOR:   PO intake, Supplement acceptance, Labs, Weight trends  REASON FOR ASSESSMENT:   Malnutrition Screening Tool    ASSESSMENT:   77 y.o. female with PMHx including chronic combined systolic and diastolic CHF, CHB s/p CRT-D, CKD stage 4, CAD s/p CABG, PAF not on anticoagulation, chronic respiratory failure with hypoxia on 3L [place], asthma, DM2, HTN, HLD, chronic thrombocytopenia, OSA intolerant to CPAP who present with abdominal pain and dysuria 2/2 ARF superimposed on CKD 4 Recently admitted 3/11-15/24 for encephalopathy and expressive aphasia. No imaging showed a stroke.   Labs:  K+ 3.1, Glu 114, last A1c 5.8 (04/22/22) BUN 57, Cr 3.26, alk phos 395 Meds: novolog, protonix, klor-con m, crestor, NS Wt: 14.5 kg (17%) wt loss x 3 months  PO: no meal intake recorded  I/O's: +1.1 L   Visited patient at bedside who had a flat effect. She reports no appetite for a while. She reports nausea every morning that is controlled with meds. Patient reports ongoing abdominal pain and states her doctors suspect it's pancreatitis and/or cancer. Patient reports minimal po intake and not eating any meals. Patient lives at home with very little care provided and states she is probably going back to a facility.   Patient denies N/V, trouble chewing/swallowing but does report her bowels to fluctuate from being constipated to having diarrhea. Patient reports hx of IBS and states she did  take meds for it but not anymore and was not able to recall the name.   Patient expressed dislike for Nepro shakes and is open to trying Prosource.   NUTRITION - FOCUSED PHYSICAL EXAM:  Flowsheet Row Most Recent Value  Orbital Region Mild depletion  Upper Arm Region Mild depletion  Thoracic and Lumbar Region No depletion  Buccal Region Mild depletion  Temple Region Moderate depletion  Clavicle Bone Region No depletion  Clavicle and Acromion Bone Region No depletion  Scapular Bone Region Unable to assess  Dorsal Hand No depletion  Patellar Region Mild depletion  Anterior Thigh Region Mild depletion  Posterior Calf Region Mild depletion  Edema (RD Assessment) None  Hair Reviewed  Eyes Reviewed  Mouth Reviewed  Skin Reviewed  Nails Reviewed       Diet Order:   Diet Order             Diet heart healthy/carb modified Room service appropriate? Yes; Fluid consistency: Thin  Diet effective now                   EDUCATION NEEDS:   Education needs have been addressed  Skin:  Skin Assessment: Skin Integrity Issues: Skin Integrity Issues:: Stage I Stage I: R buttock  Last BM:  3/31 type 4  Height:   Ht Readings from Last 1 Encounters:  05/11/22 5\' 3"  (1.6 m)    Weight:   Wt Readings from Last 1 Encounters:  05/11/22 71.2 kg    Ideal Body Weight:     BMI:  Body mass index is 27.81 kg/m.  Estimated Nutritional Needs:   Kcal:  1550-1800  Protein:  80-100  Fluid:  >/= 2L    Trey Paula, RDN, LDN  Clinical Nutrition

## 2022-05-12 NOTE — Progress Notes (Signed)
L hip xray reviewed, okay to advance WB to WBAT with no precautions.

## 2022-05-13 DIAGNOSIS — N179 Acute kidney failure, unspecified: Secondary | ICD-10-CM | POA: Diagnosis not present

## 2022-05-13 DIAGNOSIS — N184 Chronic kidney disease, stage 4 (severe): Secondary | ICD-10-CM | POA: Diagnosis not present

## 2022-05-13 DIAGNOSIS — N3000 Acute cystitis without hematuria: Secondary | ICD-10-CM

## 2022-05-13 LAB — BASIC METABOLIC PANEL
Anion gap: 7 (ref 5–15)
BUN: 55 mg/dL — ABNORMAL HIGH (ref 8–23)
CO2: 21 mmol/L — ABNORMAL LOW (ref 22–32)
Calcium: 8.2 mg/dL — ABNORMAL LOW (ref 8.9–10.3)
Chloride: 109 mmol/L (ref 98–111)
Creatinine, Ser: 3.05 mg/dL — ABNORMAL HIGH (ref 0.44–1.00)
GFR, Estimated: 15 mL/min — ABNORMAL LOW (ref >60)
Glucose, Bld: 121 mg/dL — ABNORMAL HIGH (ref 70–99)
Potassium: 4 mmol/L (ref 3.5–5.1)
Sodium: 137 mmol/L (ref 135–145)

## 2022-05-13 LAB — CUP PACEART REMOTE DEVICE CHECK
Date Time Interrogation Session: 20240329083307
Implantable Lead Connection Status: 753985
Implantable Lead Connection Status: 753985
Implantable Lead Connection Status: 753985
Implantable Lead Implant Date: 20080923
Implantable Lead Implant Date: 20080923
Implantable Lead Implant Date: 20080923
Implantable Lead Location: 753858
Implantable Lead Location: 753859
Implantable Lead Location: 753860
Implantable Lead Model: 4285
Implantable Lead Model: 4555
Implantable Lead Model: 7120
Implantable Lead Serial Number: 170220
Implantable Lead Serial Number: 486468
Implantable Pulse Generator Implant Date: 20221121
Pulse Gen Serial Number: 720887

## 2022-05-13 LAB — MAGNESIUM: Magnesium: 2.3 mg/dL (ref 1.7–2.4)

## 2022-05-13 LAB — GLUCOSE, CAPILLARY
Glucose-Capillary: 105 mg/dL — ABNORMAL HIGH (ref 70–99)
Glucose-Capillary: 250 mg/dL — ABNORMAL HIGH (ref 70–99)
Glucose-Capillary: 168 mg/dL — ABNORMAL HIGH (ref 70–99)

## 2022-05-13 LAB — AFP TUMOR MARKER: AFP, Serum, Tumor Marker: <1.8 ng/mL (ref 0.0–9.2)

## 2022-05-13 MED ORDER — SODIUM CHLORIDE 0.9 % IV SOLN: INTRAVENOUS | Status: AC

## 2022-05-13 NOTE — Progress Notes (Signed)
Occupational Therapy Treatment Patient Details Name: [name] MRN: [id number] DOB: 05/27/1945 Today's Date: 05/13/2022   History of present illness Patient is a 77 year old female who presented on 3/31 with epigastric pain. Patient was found at home unable to take care of her self per MD note. Patient was admitted with acute renal failure superimposed on CKD IV, 1.4 cm pancreatic head lesion, innumerable new hepatic hypodensities, UTI. PMH: recent hospitalization 04/21/2022-04/25/2022 for encephalopathy and expressive aphasia, CAD s/p CABG, hypokalemia, COVID 19, a fib,   OT comments  Patient was agreeable to get up out of bed with TD for hygiene from nursing in standing with +2 support with RW with increased time. Patient was TD to transition to recliner with patient attempting to take the first few steps then attempting to sit prior to finishing last two steps. Patient's discharge plan remains appropriate at this time. OT will continue to follow acutely.     Recommendations for follow up therapy are one component of a multi-disciplinary discharge planning process, led by the attending physician.  Recommendations may be updated based on patient status, additional functional criteria and insurance authorization.    Assistance Recommended at Discharge Frequent or constant Supervision/Assistance  Patient can return home with the following  Two people to help with walking and/or transfers;Two people to help with bathing/dressing/bathroom;Direct supervision/assist for financial management;Help with stairs or ramp for entrance;Assist for transportation;Direct supervision/assist for medications management;Assistance with cooking/housework   Equipment Recommendations  Other (comment) (defer to next venue of care)       Precautions / Restrictions Precautions Precautions: Fall Restrictions Weight Bearing Restrictions: No LLE Weight Bearing: Weight bearing as tolerated Other Position/Activity  Restrictions: per secure chat with Dr.Marchwiany, patient is now WBAT and has no precautions       Mobility Bed Mobility Overal bed mobility: Needs Assistance   Rolling: Mod assist     Sit to supine: Max assist   General bed mobility comments: with increased A to participate in supine to sit on EOB         Balance Overall balance assessment: Needs assistance Sitting-balance support: No upper extremity supported, Feet supported Sitting balance-Leahy Scale: Fair     Standing balance support: Bilateral upper extremity supported, Reliant on assistive device for balance Standing balance-Leahy Scale: Zero             ADL either performed or assessed with clinical judgement   ADL Overall ADL's : Needs assistance/impaired       Lower Body Dressing: Bed level;Total assistance Lower Body Dressing Details (indicate cue type and reason): gripper socks patient requesting slipper but no slippers in room Toilet Transfer: +2 for physical assistance;+2 for safety/equipment;Total assistance;Rolling walker (2 wheels);Stand-pivot [employer] technical [employer] Details (indicate cue type and reason): patient needed increaed time to reach the recliner in room with patient attempting to sit down prior to reaching chair with TD to land safely. Toileting- Clothing Manipulation and Hygiene: Bed level;Total assistance                Cognition Arousal/Alertness: Awake/alert Behavior During Therapy: WFL for tasks assessed/performed Overall Cognitive Status: No family/caregiver present to determine baseline cognitive functioning         General Comments: patient was more agreeable to participate in session with two granddaughter present. still remained self limiting at times during session                   Pertinent Vitals/ Pain       Pain  Assessment Pain Assessment: Faces Faces Pain Scale: Hurts even more Pain Location: abd, L hip Pain Descriptors / Indicators: Discomfort, Grimacing,  Guarding Pain Intervention(s): Limited activity within patient's tolerance, Monitored during session, Repositioned, RN gave pain meds during session         Frequency  Min 2X/week        Progress Toward Goals  OT Goals(current goals can now be found in the care plan section)  Progress towards OT goals: Progressing toward goals     Plan Discharge plan remains appropriate       AM-PAC OT "6 Clicks" Daily Activity     Outcome Measure   Help from another person eating meals?: A Little Help from another person taking care of personal grooming?: A Little Help from another person toileting, which includes using toliet, bedpan, or urinal?: Total Help from another person bathing (including washing, rinsing, drying)?: A Lot Help from another person to put on and taking off regular upper body clothing?: A Lot Help from another person to put on and taking off regular lower body clothing?: Total 6 Click Score: 12    End of Session Equipment Utilized During Treatment: Gait belt;Rolling walker (2 wheels)  OT Visit Diagnosis: Other abnormalities of gait and mobility (R26.89);Muscle weakness (generalized) (M62.81);Other symptoms and signs involving cognitive function   Activity Tolerance Patient tolerated treatment well;Patient limited by fatigue   Patient Left with call bell/phone within reach;in chair;with nursing/sitter in room (nurse and NT in room)   Nurse Communication Other (comment)        [id number] OT Time Calculation (min): 14 min  Charges: OT General Charges $OT Visit: 1 Visit OT Treatments $Self Care/Home Management : 8-22 mins  Rennie Plowman, MS Acute Rehabilitation Department Office# [phone]   Willa Rough 05/13/2022, 12:44 PM

## 2022-05-13 NOTE — Progress Notes (Signed)
PROGRESS NOTE    [name]  [id number] DOB: [birth year] DOA: 05/11/2022 PCP: Lujean Amel, MD    Brief Narrative:   [name] is a 77 y.o. female with past medical history significant for chronic combined systolic and diastolic CHF (EF [id number] %), CHB s/p CRT-D, CKD stage IV, CAD s/p CABG, PAF not on anticoagulation, chronic respiratory failure with hypoxia on 3 L [place], asthma, T2DM, HTN, HLD, chronic thrombocytopenia, OSA intolerant to CPAP who presented to the ED for evaluation of abdominal pain and dysuria. Patient states that she has been having 1 day of epigastric abdominal pain without nausea or vomiting.  She has had dysuria as well.  She denies any diarrhea, fevers, chills, diaphoresis.  She has had some exertional dyspnea.  Denies chest pain.  She says she hit her left foot resulting in a laceration to her second left toe which has been bleeding.   Patient recently admitted 04/21/2022-04/25/2022 for encephalopathy and expressive aphasia.  CT head was negative x 2 for any acute stroke.  MRI unable to be performed due to pacemaker.  CTA not performed due to contrast allergy.  Transcranial and carotid Dopplers were unremarkable.  Her Plavix was discontinued and she was switched to DAPT with aspirin and Brilinta for 4 weeks to be followed by aspirin alone.  PT/OT recommended SNF however insurance denied it and patient returned back to long-term care.   Patient reportedly checked herself out of her long-term care facility and has been at home mostly immobile on the couch unable to care for herself.  She admits that it was a mistake to have left the facility.   In the ED, BP 111/72, pulse 87, RR 17, temp 97.9 F, SpO2 96% on room air. WBC 11.0, hemoglobin 11.5, platelets 93,000, sodium 136, potassium 2.1, bicarb 23, BUN 60, creatinine 3.61, serum glucose 128, lactic acid 1.3.  Urinalysis shows negative nitrites, large leukocytes, 0-5 RBCs, >50 WBCs, many bacteria on microscopy. Left  foot x-ray negative for fracture or dislocation. Chest x-ray shows cardiomegaly, prior CABG changes, no active disease. CT abdomen/pelvis without contrast shows innumerable new hepatic hypodensities measuring up to 3.0 cm suspicious for metastatic disease.  Ill-defined 1.4 cm hypoattenuating focus within the pancreatic head is seen, suspicious for primary pancreatic neoplasm.  Lytic focus along medial left acetabulum measuring up to 2.3 cm at the site of prior compression screw may be postsurgical.  Indeterminate 2.2 cm right renal lower pole exophytic lesion noted. Patient was given IV ceftriaxone, 1 L LR, IV K 10 mEq x 1, oral K 40 mEq, Mag-Ox 800 mg.  The hospitalist service was consulted to admit for further evaluation and management.  Assessment & Plan:   Acute renal failure superimposed on CKD stage IV: Creatinine on admission 3.61 with baseline 2.0-2.2.  Appears largely euvolemic on admission.  Does not appear to be on any offending medications. CT abdomen/pelvis with no hydronephrosis.  Renal ultrasound with bilateral increased renal cortical echotexture consistent with medical renal disease, bilateral simple appearing renal cyst. -- Cr 3.61>3.26>3.05 -- Continue NS at 75 mL/h -- Continue monitor urine output -- BMP daily   1.4 cm pancreatic head lesion Innumerable new hepatic hypodensities: Findings noted on CT imaging.  Pancreatic head lesion concerning for primary pancreatic neoplasm and hepatic lesions suspicious for metastatic disease.  Discussed findings with patient.  aFP less than 1.8 -- CA 19-9: Pending -- Will need outpatient follow-up with [facility], medical oncology   Proteus Mirabilis urinary tract infection: Urinalysis  with large leukocytes, negative nitrite, many bacteria, greater than 50 WBCs.  -- Urine culture: + >100K Proteus; susceptibilities pending -- Ceftriaxone 1 g IV every 24 hours   Chronic combined systolic and diastolic CHF CHB s/p CRT-D: Appears euvolemic  on admission.  Does not appear to be on diuretics or usual GDMT as an outpatient.   Paroxysmal atrial fibrillation: Low burden A-fib mostly in paced rhythm chronically.  Not on anticoagulation.  Recently placed on aspirin/Brilinta. -- Continue aspirin and Brilinta   CAD s/p CABG: Stable, denies chest pain.   -- Continue Imdur and aspirin.     Hypokalemia: Potassium 3.1, will continue repletion. - -Repeat electrolytes in a.m. to include magnesium   Recent admit for expressive aphasia/TIA?: She was started on DAPT with aspirin/Brilinta x 4 weeks to be followed by aspirin alone for presumed CVA.  Workup was limited.  Unable to obtain MRI due to pacemaker and could not obtain CTA due to contrast allergy. -- Continue aspirin/Brilinta and rosuvastatin   Thrombocytopenia: Stable, continue to monitor.   Renal cyst: Seen again on CT imaging.  Renal ultrasound with bilateral simple appearing renal cysts, no follow-up required.   Chronic respiratory failure with hypoxia Asthma: Stable on home 3 L O2 via [place].  No wheezing.  Continue Dulera and albuterol as needed.   Laceration left second toe: Right Buttock stage I pressure injury, POA Pressure Injury 05/11/22 Buttocks Right;Left Stage 1 -  Intact skin with non-blanchable redness of a localized area usually over a bony prominence. (Active)  05/11/22 2326  Location: Buttocks  Location Orientation: Right;Left  Staging: Stage 1 -  Intact skin with non-blanchable redness of a localized area usually over a bony prominence.  Wound Description (Comments):   Present on Admission: Yes  X-ray negative for evidence of fracture or dislocation.  Continue wound care.  Type 2 diabetes: Well-controlled with last A1c 5.8% on 04/22/2022.   -- sensitive SSI for coverage. -- CBG before every meal/at bedtime   Hypertension: Continue Imdur 30 mg p.o. daily.   Hyperlipidemia: Continue rosuvastatin 10 mg p.o. nightly.   Obstructive sleep  apnea: Intolerant of CPAP.  Continue supplemental O2 via [place] at 3L [place].  Weakness/debility/deconditioning: Patient previously a resident at a long-term care facility; now residing at home alone. --PT/OT evaluation recommend SNF placement -- TOC consulted for placement   DVT prophylaxis: SCDs Start: 05/11/22 2143    Code Status: Full Code Family Communication: Updated daughter present at bedside this morning  Disposition Plan:  Level of care: Med-Surg Status is: Inpatient Remains inpatient appropriate because: IV fluid hydration, IV antibiotics, pending SNF placement    Consultants:  Orthopedics, Dr. Yetta Numbers  Procedures:  Renal ultrasound  Antimicrobials:  Ceftriaxone 3/31>>   Subjective: Patient seen examined bedside, resting comfortably.  Lying in bed.  Daughter present.  No specific complaints this morning other than generalized weakness.  Denies headache, no fever/chills/night sweats, no nausea/vomiting/diarrhea, no chest pain, no palpitations, no shortness of breath more than her typical baseline, no abdominal pain, no focal weakness, no fatigue, no paresthesias.  No acute events overnight per nursing staff.  Objective: Vitals:   05/12/22 1209 05/12/22 2048 05/12/22 2138 05/13/22 0636  BP: (!) 110/55  (!) 123/52 134/74  Pulse: 75  72 83  Resp: 16  20 20   Temp: 97.8 F (36.6 C)  98.4 F (36.9 C) 97.7 F (36.5 C)  TempSrc: Oral  Oral Oral  SpO2: 100% 100% 95% 100%  Weight:      Height:  Intake/Output Summary (Last 24 hours) at 05/13/2022 1142 Last data filed at 05/13/2022 1000 Gross per 24 hour  Intake 580 ml  Output --  Net 580 ml   Filed Weights   05/11/22 1536  Weight: 71.2 kg    Examination:  Physical Exam: GEN: NAD, alert and oriented x 3 HEENT: NCAT, PERRL, EOMI, sclera clear, MMM PULM: CTAB w/o wheezes/crackles, normal respiratory effort, on 3 L nasal cannula which is her baseline with SpO2 100% at rest CV: RRR w/o M/G/R GI: abd soft,  NTND, NABS, no R/G/M MSK: no peripheral edema, moves all extremities independently NEURO: CN II-XII intact, no focal deficits, sensation to light touch intact PSYCH: normal mood/affect Integumentary: Left foot with dressing in place with slight blood strikethrough, otherwise clean/dry/intact, stage I sacral ulcer noted, otherwise no other concerning rashes/lesions/wounds noted on exposed skin surfaces    Data Reviewed: I have personally reviewed following labs and imaging studies  CBC: Recent Labs  Lab 05/11/22 1601 05/12/22 0423  WBC 11.0* 9.4  NEUTROABS 9.0*  --   HGB 11.5* 10.1*  HCT 36.7 32.2*  MCV 89.5 89.4  PLT 93* 81*   Basic Metabolic Panel: Recent Labs  Lab 05/11/22 1601 05/12/22 0423 05/13/22 0436  NA 136 136 137  K 2.1* 3.1* 4.0  CL 102 105 109  CO2 23 23 21*  GLUCOSE 128* 114* 121*  BUN 60* 57* 55*  CREATININE 3.61* 3.26* 3.05*  CALCIUM 8.8* 8.4* 8.2*  MG 2.2  --  2.3   GFR: Estimated Creatinine Clearance: 14.4 mL/min (A) (by C-G formula based on SCr of 3.05 mg/dL (H)). Liver Function Tests: Recent Labs  Lab 05/11/22 2258 05/12/22 0423  AST 41 26  ALT 24 22  ALKPHOS 455* 395*  BILITOT 1.0 0.4  PROT 6.3* 5.0*  ALBUMIN 2.4* 2.0*   Recent Labs  Lab 05/11/22 2258  LIPASE 73*   No results for input(s): "AMMONIA" in the last 168 hours. Coagulation Profile: No results for input(s): "INR", "PROTIME" in the last 168 hours. Cardiac Enzymes: No results for input(s): "CKTOTAL", "CKMB", "CKMBINDEX", "TROPONINI" in the last 168 hours. BNP (last 3 results) No results for input(s): "PROBNP" in the last 8760 hours. HbA1C: No results for input(s): "HGBA1C" in the last 72 hours. CBG: Recent Labs  Lab 05/12/22 1206 05/12/22 1621 05/12/22 2146 05/13/22 0800 05/13/22 1123  GLUCAP 157* 184* 122* 105* 250*   Lipid Profile: No results for input(s): "CHOL", "HDL", "LDLCALC", "TRIG", "CHOLHDL", "LDLDIRECT" in the last 72 hours. Thyroid Function  Tests: No results for input(s): "TSH", "T4TOTAL", "FREET4", "T3FREE", "THYROIDAB" in the last 72 hours. Anemia Panel: No results for input(s): "VITAMINB12", "FOLATE", "FERRITIN", "TIBC", "IRON", "RETICCTPCT" in the last 72 hours. Sepsis Labs: Recent Labs  Lab 05/11/22 1610  LATICACIDVEN 1.3    Recent Results (from the past 240 hour(s))  Urine Culture (for pregnant, neutropenic or urologic patients or patients with an indwelling urinary catheter)     Status: Abnormal (Preliminary result)   Collection Time: 05/11/22  7:07 PM   Specimen: Urine, Clean Catch  Result Value Ref Range Status   Specimen Description   Final    URINE, CLEAN CATCH Performed at [facility] [address] [place], [place] [address]    Special Requests   Final    NONE Performed at [facility], [place] [address], [place], [place] [address]    Culture (A)  Final    >=100,000 COLONIES/mL PROTEUS MIRABILIS SUSCEPTIBILITIES TO FOLLOW Performed at [facility]  [facility], [place] [address] [place], [place] [address]    Report Status PENDING  Incomplete         Radiology Studies: DG HIP UNILAT W OR W/O PELVIS 2-3 VIEWS LEFT  Result Date: 05/12/2022 CLINICAL DATA:  Postoperative left hip surgery 02/13/2022 EXAM: DG HIP (WITH OR WITHOUT PELVIS) 2-3V LEFT COMPARISON:  CT abdomen pelvis 05/11/2022, left hip radiographs 02/17/2022 FINDINGS: Redemonstration of total left hip arthroplasty with moderately long femoral stem. Lateral hook plate and cerclage wiring around the proximal to mid aspects of the femoral stem, similar to prior. No perihardware lucency is seen to indicate hardware failure or loosening. Mild superolateral right femoroacetabular joint space narrowing and peripheral osteophytosis. IMPRESSION: 1. Status post total left hip arthroplasty without evidence of hardware failure. 2. Mild right femoroacetabular osteoarthritis. Electronically Signed   By: Yvonne Kendall M.D.   On:  05/12/2022 14:24   US RENAL  Result Date: 05/11/2022 CLINICAL DATA:  Acute on chronic renal insufficiency EXAM: RENAL / URINARY TRACT ULTRASOUND COMPLETE COMPARISON:  05/11/2022 FINDINGS: Right Kidney: Renal measurements: 10.0 x 4.6 x 4.7 cm = volume: 111.4 mL. Increased renal cortical echotexture consistent with medical renal disease. No hydronephrosis or nephrolithiasis. There are 2 simple renal cysts identified, largest in the lower pole measuring 2.7 x 2.0 x 2.3 cm, and a smaller in the upper pole measuring 2.2 x 1.4 x 1.7 cm. Left Kidney: Renal measurements: 8.8 x 4.2 by 4.9 cm = volume: 93.9 mL. Increased renal cortical echotexture. No hydronephrosis or nephrolithiasis. Exophytic simple cyst off the mid left kidney measures 1.7 by 1.3 by 1.5 cm. There is also a 1.3 cm simple cortical cyst lower pole left kidney. Bladder: Appears normal for degree of bladder distention. Other: None. IMPRESSION: 1. Bilateral increased renal cortical echotexture consistent with medical renal disease. 2. Bilateral simple appearing renal cysts. No specific imaging follow-up is required. Electronically Signed   By: Randa Ngo M.D.   On: 05/11/2022 23:20   CT ABDOMEN PELVIS WO CONTRAST  Result Date: 05/11/2022 CLINICAL DATA:  Epigastric pain EXAM: CT ABDOMEN AND PELVIS WITHOUT CONTRAST TECHNIQUE: Multidetector CT imaging of the abdomen and pelvis was performed following the standard protocol without IV contrast. RADIATION DOSE REDUCTION: This exam was performed according to the departmental dose-optimization program which includes automated exposure control, adjustment of the mA and/or kV according to patient size and/or use of iterative reconstruction technique. COMPARISON:  CT abdomen and pelvis dated 03/17/2022 FINDINGS: Lower chest: No focal consolidation or pulmonary nodule in the lung bases. No pleural effusion or pneumothorax demonstrated. Partially imaged heart size is normal. Partially imaged median sternotomy  wires are nondisplaced. Hepatobiliary: Innumerable new hepatic hypodensities measuring up to 3.0 cm (2:16). No intra or extrahepatic biliary ductal dilation. Cholecystectomy. Pancreas: Ill-defined 1.4 cm hypoattenuating focus within the pancreatic head (2:29), not well seen on prior examination. No main pancreatic ductal dilation. Spleen: Normal in size without focal abnormality. Adrenals/Urinary Tract: No adrenal nodules. Bilateral renal cortical thinning. No hydronephrosis or calculi. 2.2 cm right lower pole exophytic lesion does not demonstrate simple fluid attenuation. Additional bilateral simple-appearing renal cysts. No specific follow-up imaging recommended. No focal bladder wall thickening. Stomach/Bowel: Normal appearance of the stomach. No evidence of bowel wall thickening, distention, or inflammatory changes. Colonic diverticulosis without acute diverticulitis. Appendix is not discretely seen. Vascular/Lymphatic: Aortic atherosclerosis. Retroaortic left renal vein. No enlarged abdominal or pelvic lymph nodes. Reproductive: No adnexal masses. Other: Mild presacral edema.  No free air. Musculoskeletal: Lytic focus along  the medial left acetabulum (2:66) measuring 2.3 cm at the site of prior compression screw. Status post left hip arthroplasty. Postsurgical changes from partially imaged thoracic spinal fixation. Unchanged fracture of T9. Multilevel degenerative changes of the partially imaged thoracic and lumbar spine. Unchanged grade 1 anterolisthesis at L5-S1. IMPRESSION: 1. Innumerable new hepatic hypodensities measuring up to 3.0 cm suspicious for metastatic disease. 2. Ill-defined 1.4 cm hypoattenuating focus within the pancreatic head, not well seen on prior examination, suspicious for primary pancreatic neoplasm. 3. Lytic focus along the medial left acetabulum measuring up to 2.3 cm at the site of prior compression screw may be postsurgical. 4. Indeterminate 2.2 cm right renal lower pole exophytic  lesion. Given the patient's history of contraindication to MRI and contrast-enhanced CT, ultrasound of the kidneys can be considered for further characterization. 5.  Aortic Atherosclerosis (ICD10-I70.0). These results were called by telephone at the time of interpretation on 05/11/2022 at 7:37 pm to provider MAD[facility] , who verbally acknowledged these results. Electronically Signed   By: Darrin Nipper M.D.   On: 05/11/2022 19:41   DG Chest Portable 1 View  Result Date: 05/11/2022 CLINICAL DATA:  Shortness of breath EXAM: PORTABLE CHEST 1 VIEW COMPARISON:  04/21/2022 FINDINGS: Cardiomegaly. Prior CABG. Right pacer remains in place, unchanged. No confluent airspace opacities, effusions or edema. Aortic atherosclerosis. No acute bony abnormality. IMPRESSION: Cardiomegaly.  No active disease. Electronically Signed   By: Rolm Baptise M.D.   On: 05/11/2022 17:50   DG Foot Complete Left  Result Date: 05/11/2022 CLINICAL DATA:  Wounds to the second and third digits EXAM: LEFT FOOT - COMPLETE 3 VIEW COMPARISON:  None Available. FINDINGS: There is no definite evidence of fracture or dislocation. Old fracture of the partially imaged distal fibula. Degenerative changes of the foot. Soft tissues are unremarkable. IMPRESSION: 1. No definite evidence of fracture or dislocation. 2. Old fracture of the partially imaged distal fibula. 3. No soft tissue abnormality to correspond to reported wounds. Electronically Signed   By: Darrin Nipper M.D.   On: 05/11/2022 17:49        Scheduled Meds:  (feeding supplement) PROSource Plus  30 mL Oral BID BM   aspirin EC  81 mg Oral Daily   insulin aspart  0-9 Units Subcutaneous TID WC   isosorbide mononitrate  30 mg Oral Daily   mometasone-formoterol  2 puff Inhalation BID   pantoprazole  40 mg Oral BID AC   rosuvastatin  10 mg Oral QPM   sodium chloride flush  3 mL Intravenous Q12H   Continuous Infusions:  sodium chloride     cefTRIAXone (ROCEPHIN)  IV 1 g (05/12/22 2126)      LOS: 2 days    Time spent: 51 minutes spent on chart review, discussion with nursing staff, consultants, updating family and interview/physical exam; more than 50% of that time was spent in counseling and/or coordination of care.    Peja Allender J [place] DO [facility] Available via Epic secure chat 7am-7pm After these hours, please refer to coverage provider listed on [email or web address] 05/13/2022, 11:42 AM

## 2022-05-14 DIAGNOSIS — K8689 Other specified diseases of pancreas: Secondary | ICD-10-CM

## 2022-05-14 DIAGNOSIS — E876 Hypokalemia: Secondary | ICD-10-CM | POA: Diagnosis not present

## 2022-05-14 LAB — BASIC METABOLIC PANEL
Anion gap: 6 (ref 5–15)
BUN: 48 mg/dL — ABNORMAL HIGH (ref 8–23)
CO2: 20 mmol/L — ABNORMAL LOW (ref 22–32)
Calcium: 8.1 mg/dL — ABNORMAL LOW (ref 8.9–10.3)
Chloride: 110 mmol/L (ref 98–111)
Creatinine, Ser: 2.8 mg/dL — ABNORMAL HIGH (ref 0.44–1.00)
GFR, Estimated: 17 mL/min — ABNORMAL LOW (ref >60)
Glucose, Bld: 126 mg/dL — ABNORMAL HIGH (ref 70–99)
Potassium: 4 mmol/L (ref 3.5–5.1)
Sodium: 136 mmol/L (ref 135–145)

## 2022-05-14 LAB — GLUCOSE, CAPILLARY
Glucose-Capillary: 131 mg/dL — ABNORMAL HIGH (ref 70–99)
Glucose-Capillary: 130 mg/dL — ABNORMAL HIGH (ref 70–99)
Glucose-Capillary: 122 mg/dL — ABNORMAL HIGH (ref 70–99)
Glucose-Capillary: 134 mg/dL — ABNORMAL HIGH (ref 70–99)
Glucose-Capillary: 149 mg/dL — ABNORMAL HIGH (ref 70–99)
Glucose-Capillary: 127 mg/dL — ABNORMAL HIGH (ref 70–99)

## 2022-05-14 LAB — URINE CULTURE: Culture: >=100000 — AB

## 2022-05-14 LAB — CANCER ANTIGEN 19-9: CA 19-9: 69908 U/mL — ABNORMAL HIGH (ref 0–35)

## 2022-05-14 MED ORDER — SODIUM CHLORIDE 0.9 % IV SOLN: INTRAVENOUS | Status: DC

## 2022-05-14 MED ORDER — CAMPHOR-MENTHOL 0.5-0.5 % EX LOTN: TOPICAL_LOTION | CUTANEOUS | Status: DC | PRN

## 2022-05-14 MED ORDER — AMOXICILLIN 500 MG PO CAPS
500.0000 mg | ORAL_CAPSULE | Freq: Two times a day (BID) | ORAL | Status: AC
  Administered 2022-05-14 – 2022-05-15 (×4): 500 mg via ORAL
  Filled 2022-05-14 (×4): qty 1

## 2022-05-14 NOTE — Progress Notes (Signed)
PROGRESS NOTE    [name]  [id number] DOB: Sep 29, 1945 DOA: 05/11/2022 PCP: Lujean Amel, MD    Brief Narrative:   [name] is a 77 y.o. female with past medical history significant for chronic combined systolic and diastolic CHF (EF [id number] %), CHB s/p CRT-D, CKD stage IV, CAD s/p CABG, PAF not on anticoagulation, chronic respiratory failure with hypoxia on 3 L [place], asthma, T2DM, HTN, HLD, chronic thrombocytopenia, OSA intolerant to CPAP who presented to the ED for evaluation of abdominal pain and dysuria. Patient states that she has been having 1 day of epigastric abdominal pain without nausea or vomiting.  She has had dysuria as well.  She denies any diarrhea, fevers, chills, diaphoresis.  She has had some exertional dyspnea.  Denies chest pain.  She says she hit her left foot resulting in a laceration to her second left toe which has been bleeding.   Patient recently admitted 04/21/2022-04/25/2022 for encephalopathy and expressive aphasia.  CT head was negative x 2 for any acute stroke.  MRI unable to be performed due to pacemaker.  CTA not performed due to contrast allergy.  Transcranial and carotid Dopplers were unremarkable.  Her Plavix was discontinued and she was switched to DAPT with aspirin and Brilinta for 4 weeks to be followed by aspirin alone.  PT/OT recommended SNF however insurance denied it and patient returned back to long-term care.   Patient reportedly checked herself out of her long-term care facility and has been at home mostly immobile on the couch unable to care for herself.  She admits that it was a mistake to have left the facility.   In the ED, BP 111/72, pulse 87, RR 17, temp 97.9 F, SpO2 96% on room air. WBC 11.0, hemoglobin 11.5, platelets 93,000, sodium 136, potassium 2.1, bicarb 23, BUN 60, creatinine 3.61, serum glucose 128, lactic acid 1.3.  Urinalysis shows negative nitrites, large leukocytes, 0-5 RBCs, >50 WBCs, many bacteria on microscopy. Left  foot x-ray negative for fracture or dislocation. Chest x-ray shows cardiomegaly, prior CABG changes, no active disease. CT abdomen/pelvis without contrast shows innumerable new hepatic hypodensities measuring up to 3.0 cm suspicious for metastatic disease.  Ill-defined 1.4 cm hypoattenuating focus within the pancreatic head is seen, suspicious for primary pancreatic neoplasm.  Lytic focus along medial left acetabulum measuring up to 2.3 cm at the site of prior compression screw may be postsurgical.  Indeterminate 2.2 cm right renal lower pole exophytic lesion noted. Patient was given IV ceftriaxone, 1 L LR, IV K 10 mEq x 1, oral K 40 mEq, Mag-Ox 800 mg.  The hospitalist service was consulted to admit for further evaluation and management.  Assessment & Plan:   Acute renal failure superimposed on CKD stage IV: Creatinine on admission 3.61 with baseline 2.0-2.2.  Appears largely euvolemic on admission.  Does not appear to be on any offending medications. CT abdomen/pelvis with no hydronephrosis.  Renal ultrasound with bilateral increased renal cortical echotexture consistent with medical renal disease, bilateral simple appearing renal cyst. -- Cr trending down -- Continue NS at 75 mL/h -- Continue monitor urine output -- BMP daily   1.4 cm pancreatic head lesion Innumerable new hepatic hypodensities: Findings noted on CT imaging.  Pancreatic head lesion concerning for primary pancreatic neoplasm and hepatic lesions suspicious for metastatic disease.  Discussed findings with patient.  aFP less than 1.8 -- CA 19-9 elevated-- patient updated -- Will need outpatient follow-up with [facility], medical oncology   Proteus Mirabilis urinary  tract infection: Urinalysis with large leukocytes, negative nitrite, many bacteria, greater than 50 WBCs.  -- Urine culture: + >100K Proteus -- Ceftriaxone 1 g IV changed to PO abx   Chronic combined systolic and diastolic CHF CHB s/p CRT-D: Appears euvolemic on  admission.  Does not appear to be on diuretics or usual GDMT as an outpatient.   Paroxysmal atrial fibrillation: Low burden A-fib mostly in paced rhythm chronically.  Not on anticoagulation.  Recently placed on aspirin/Brilinta. -- Continue aspirin and Brilinta   CAD s/p CABG: Stable, denies chest pain.   -- Continue Imdur and aspirin.     Hypokalemia: -replete   Recent admit for expressive aphasia/TIA?: She was started on DAPT with aspirin/Brilinta x 4 weeks to be followed by aspirin alone for presumed CVA.  Workup was limited.  Unable to obtain MRI due to pacemaker and could not obtain CTA due to contrast allergy. -- Continue aspirin/Brilinta and rosuvastatin   Thrombocytopenia: Stable, continue to monitor.   Renal cyst: Seen again on CT imaging.  Renal ultrasound with bilateral simple appearing renal cysts, no follow-up required.   Chronic respiratory failure with hypoxia Asthma: Stable on home 3 L O2 via [place].  No wheezing.  Continue Dulera and albuterol as needed.   Laceration left second toe: Right Buttock stage I pressure injury, POA Pressure Injury 05/11/22 Buttocks Right;Left Stage 1 -  Intact skin with non-blanchable redness of a localized area usually over a bony prominence. (Active)  05/11/22 2326  Location: Buttocks  Location Orientation: Right;Left  Staging: Stage 1 -  Intact skin with non-blanchable redness of a localized area usually over a bony prominence.  Wound Description (Comments):   Present on Admission: Yes  X-ray negative for evidence of fracture or dislocation.  Continue wound care.  Type 2 diabetes: Well-controlled with last A1c 5.8% on 04/22/2022.   -- sensitive SSI for coverage. -- CBG before every meal/at bedtime   Hypertension: Continue Imdur 30 mg p.o. daily.   Hyperlipidemia: Continue rosuvastatin 10 mg p.o. nightly.   Obstructive sleep apnea: Intolerant of CPAP.  Continue supplemental O2 via [place] at 3L  [place].  Weakness/debility/deconditioning: Patient previously a resident at a long-term care facility; now residing at home alone. --PT/OT evaluation recommend SNF placement -- TOC consulted for placement   DVT prophylaxis: SCDs Start: 05/11/22 2143    Code Status: Full Code Family Communication: no family at bedside  Disposition Plan:  Level of care: Med-Surg Status is: Inpatient Remains inpatient appropriate because: pending SNF placement    Consultants:  Orthopedics, Dr. Yetta Numbers  Procedures:  Renal ultrasound  Antimicrobials:  Ceftriaxone 3/31>>   Subjective: No complaints-- knows that she needs work up for pancreatic cancer  Objective: Vitals:   05/13/22 2031 05/14/22 0447 05/14/22 0448 05/14/22 0840  BP:   (!) 141/76   Pulse:   84   Resp:   20   Temp:   98.2 F (36.8 C)   TempSrc:   Oral   SpO2: 100%  100% 99%  Weight:  83 kg    Height:        Intake/Output Summary (Last 24 hours) at 05/14/2022 1051 Last data filed at 05/14/2022 0900 Gross per 24 hour  Intake 2072.73 ml  Output 950 ml  Net 1122.73 ml   Filed Weights   05/11/22 1536 05/14/22 0447  Weight: 71.2 kg 83 kg    Examination:  Physical Exam:  General: Appearance:    Obese female in no acute distress  Lungs:      respirations unlabored  Heart:    Normal heart rate.   MS:   All extremities are intact.   Neurologic:   Awake, alert       Data Reviewed: I have personally reviewed following labs and imaging studies  CBC: Recent Labs  Lab 05/11/22 1601 05/12/22 0423  WBC 11.0* 9.4  NEUTROABS 9.0*  --   HGB 11.5* 10.1*  HCT 36.7 32.2*  MCV 89.5 89.4  PLT 93* 81*   Basic Metabolic Panel: Recent Labs  Lab 05/11/22 1601 05/12/22 0423 05/13/22 0436 05/14/22 0437  NA 136 136 137 136  K 2.1* 3.1* 4.0 4.0  CL 102 105 109 110  CO2 23 23 21* 20*  GLUCOSE 128* 114* 121* 126*  BUN 60* 57* 55* 48*  CREATININE 3.61* 3.26* 3.05* 2.80*  CALCIUM 8.8* 8.4* 8.2* 8.1*  MG 2.2  --   2.3  --    GFR: Estimated Creatinine Clearance: 16.9 mL/min (A) (by C-G formula based on SCr of 2.8 mg/dL (H)). Liver Function Tests: Recent Labs  Lab 05/11/22 2258 05/12/22 0423  AST 41 26  ALT 24 22  ALKPHOS 455* 395*  BILITOT 1.0 0.4  PROT 6.3* 5.0*  ALBUMIN 2.4* 2.0*   Recent Labs  Lab 05/11/22 2258  LIPASE 73*   No results for input(s): "AMMONIA" in the last 168 hours. Coagulation Profile: No results for input(s): "INR", "PROTIME" in the last 168 hours. Cardiac Enzymes: No results for input(s): "CKTOTAL", "CKMB", "CKMBINDEX", "TROPONINI" in the last 168 hours. BNP (last 3 results) No results for input(s): "PROBNP" in the last 8760 hours. HbA1C: No results for input(s): "HGBA1C" in the last 72 hours. CBG: Recent Labs  Lab 05/13/22 1123 05/13/22 1627 05/13/22 2101 05/14/22 0818 05/14/22 0836  GLUCAP 250* 168* 131* 130* 122*   Lipid Profile: No results for input(s): "CHOL", "HDL", "LDLCALC", "TRIG", "CHOLHDL", "LDLDIRECT" in the last 72 hours. Thyroid Function Tests: No results for input(s): "TSH", "T4TOTAL", "FREET4", "T3FREE", "THYROIDAB" in the last 72 hours. Anemia Panel: No results for input(s): "VITAMINB12", "FOLATE", "FERRITIN", "TIBC", "IRON", "RETICCTPCT" in the last 72 hours. Sepsis Labs: Recent Labs  Lab 05/11/22 1610  LATICACIDVEN 1.3    Recent Results (from the past 240 hour(s))  Urine Culture (for pregnant, neutropenic or urologic patients or patients with an indwelling urinary catheter)     Status: Abnormal   Collection Time: 05/11/22  7:07 PM   Specimen: Urine, Clean Catch  Result Value Ref Range Status   Specimen Description   Final    URINE, CLEAN CATCH Performed at [facility] [address] [place], [place] [address]    Special Requests   Final    NONE Performed at [facility], [place] [address], [place], [place] [address]    Culture >=100,000 COLONIES/mL PROTEUS MIRABILIS (A)  Final    Report Status 05/14/2022 FINAL  Final   Organism ID, Bacteria PROTEUS MIRABILIS (A)  Final      Susceptibility   Proteus mirabilis - MIC*    AMPICILLIN <=2 SENSITIVE Sensitive     CEFAZOLIN <=4 SENSITIVE Sensitive     CEFEPIME <=0.12 SENSITIVE Sensitive     CEFTRIAXONE <=0.25 SENSITIVE Sensitive     CIPROFLOXACIN >=4 RESISTANT Resistant     GENTAMICIN <=1 SENSITIVE Sensitive     IMIPENEM 2 SENSITIVE Sensitive     NITROFURANTOIN 128 RESISTANT Resistant     TRIMETH/SULFA <=20 SENSITIVE Sensitive     AMPICILLIN/SULBACTAM <=2 SENSITIVE  Sensitive     PIP/TAZO <=4 SENSITIVE Sensitive     * >=100,000 COLONIES/mL PROTEUS MIRABILIS         Radiology Studies: DG HIP UNILAT W OR W/O PELVIS 2-3 VIEWS LEFT  Result Date: 05/12/2022 CLINICAL DATA:  Postoperative left hip surgery 02/13/2022 EXAM: DG HIP (WITH OR WITHOUT PELVIS) 2-3V LEFT COMPARISON:  CT abdomen pelvis 05/11/2022, left hip radiographs 02/17/2022 FINDINGS: Redemonstration of total left hip arthroplasty with moderately long femoral stem. Lateral hook plate and cerclage wiring around the proximal to mid aspects of the femoral stem, similar to prior. No perihardware lucency is seen to indicate hardware failure or loosening. Mild superolateral right femoroacetabular joint space narrowing and peripheral osteophytosis. IMPRESSION: 1. Status post total left hip arthroplasty without evidence of hardware failure. 2. Mild right femoroacetabular osteoarthritis. Electronically Signed   By: Yvonne Kendall M.D.   On: 05/12/2022 14:24        Scheduled Meds:  (feeding supplement) PROSource Plus  30 mL Oral BID BM   aspirin EC  81 mg Oral Daily   insulin aspart  0-9 Units Subcutaneous TID WC   isosorbide mononitrate  30 mg Oral Daily   mometasone-formoterol  2 puff Inhalation BID   pantoprazole  40 mg Oral BID AC   rosuvastatin  10 mg Oral QPM   sodium chloride flush  3 mL Intravenous Q12H   Continuous Infusions:  sodium chloride      cefTRIAXone (ROCEPHIN)  IV Stopped (05/13/22 2207)     LOS: 3 days    Time spent: 51 minutes spent on chart review, discussion with nursing staff, consultants, updating family and interview/physical exam; more than 50% of that time was spent in counseling and/or coordination of care.    Alicia Girt, DO [facility] Available via Epic secure chat 7am-7pm After these hours, please refer to coverage provider listed on [email or web address] 05/14/2022, 10:51 AM

## 2022-05-14 NOTE — TOC Initial Note (Signed)
Transition of Care [facility]) - Initial/Assessment Note    Patient Details  Name: [name] MRN: [id number] Date of Birth: [birth year]  Transition of Care [facility]) CM/SW Contact:    Illene Regulus, LCSW Phone Number: 05/14/2022, 2:55 PM  Clinical Narrative:                 CSW spoke to pt about recommendations for SNF placement. Pt has agreed , she reports she would like to go back to facility near hospital . Pt could not recall name of this facility. Pt reported she would like a facility near her home. Pt stated she would like to go to facility near her home but does not know the name. CSW explained the process, pt will need insurance auth. Pt maybe referring to [facility] at [place]. CSW will work pt up for SNF placement. TOC to follow.    Expected Discharge Plan: Skilled Nursing Facility Barriers to Discharge: Continued Medical Work up   Patient Goals and CMS Choice Patient states their goals for this hospitalization and ongoing recovery are:: SNF placement          Expected Discharge Plan and Services       Living arrangements for the past 2 months: Apartment                                      Prior Living Arrangements/Services Living arrangements for the past 2 months: Apartment Lives with:: Self Patient language and need for interpreter reviewed:: No Do you feel safe going back to the place where you live?: Yes      Need for Family Participation in Patient Care: No (Comment) Care giver support system in place?: Yes (comment)   Criminal Activity/Legal Involvement Pertinent to Current Situation/Hospitalization: No - Comment as needed  Activities of Daily Living Home Assistive Devices/Equipment: Dentures (specify type), Oxygen ADL Screening (condition at time of admission) Patient's cognitive ability adequate to safely complete daily activities?: Yes Is the patient deaf or have difficulty hearing?: No Does the patient have difficulty seeing, even  when wearing glasses/contacts?: No Does the patient have difficulty concentrating, remembering, or making decisions?: No Patient able to express need for assistance with ADLs?: Yes Does the patient have difficulty dressing or bathing?: Yes Independently performs ADLs?: No Communication: Independent Dressing (OT): Needs assistance Is this a change from baseline?: Pre-admission baseline Grooming: Needs assistance Is this a change from baseline?: Pre-admission baseline Feeding: Needs assistance Is this a change from baseline?: Pre-admission baseline Bathing: Needs assistance Is this a change from baseline?: Pre-admission baseline Toileting: Needs assistance Is this a change from baseline?: Pre-admission baseline In/Out Bed: Needs assistance Is this a change from baseline?: Pre-admission baseline Walks in Home: Needs assistance Is this a change from baseline?: Pre-admission baseline Does the patient have difficulty walking or climbing stairs?: Yes Weakness of Legs: Both Weakness of Arms/Hands: Both  Permission Sought/Granted                  Emotional Assessment Appearance:: Appears stated age Attitude/Demeanor/Rapport: Gracious Affect (typically observed): Accepting Orientation: : Oriented to Self, Oriented to Place, Oriented to  Time      Admission diagnosis:  Hypokalemia [E87.6] Pancreatic mass [K86.89] Acute cystitis with hematuria [N30.01] Acute renal failure superimposed on stage 4 chronic kidney disease [N17.9, N18.4] Patient Active Problem List   Diagnosis Date Noted   Malnutrition of moderate degree 05/12/2022  Acute renal failure superimposed on stage 4 chronic kidney disease 05/11/2022   Pancreatic lesion 05/11/2022   Hepatic lesion 05/11/2022   UTI (urinary tract infection) 05/11/2022   Acute on chronic systolic CHF (congestive heart failure) 04/23/2022   Expressive aphasia 0000000   Acute metabolic encephalopathy 0000000   Asthma without acute  exacerbation 04/21/2022   Heart failure with reduced ejection fraction 04/21/2022   Candida esophagitis 03/20/2022   Gastritis and gastroduodenitis 03/20/2022   Nausea without vomiting 03/20/2022   Heme positive stool 03/19/2022   Acute on chronic anemia 03/19/2022   Abdominal pain, chronic, epigastric 03/19/2022   Acute pancreatitis 03/17/2022   Dyslipidemia 03/17/2022   GERD without esophagitis 03/17/2022   Coronary artery disease 03/17/2022   Pain from implanted hardware 02/06/2022   Diabetic ulcer of toe 11/18/2021   Pain due to onychomycosis of toenails of both feet 11/18/2021   Pressure injury of back, stage 2 11/03/2021   Acute on chronic respiratory failure with hypoxia 11/02/2021   History of fracture of left hip 09/10/2021   Iatrogenic adrenal insufficiency 09/10/2021   T9 vertebral fracture 09/09/2021   Acute kidney injury superimposed on chronic kidney disease 09/09/2021   Chronic hypoxemic respiratory failure 09/09/2021   Acute combined systolic and diastolic heart failure    Acute CHF (congestive heart failure) 08/04/2021   Callus 07/19/2021   Hypervolemia associated with renal insufficiency 06/11/2021   Physical deconditioning 06/11/2021   Subcutaneous fat necrosis    Pressure injury of skin 04/13/2021   Closed left hip fracture ([place]) secondary to fall at home 04/03/2021   Closed fracture of left superior pubic ramus 04/03/2021   Chronic kidney disease, stage III B (moderate) ([place]lie) 04/03/2021   Fall at home, initial encounter 04/03/2021   Hypokalemia 04/03/2021   Obesity hypoventilation syndrome 04/02/2021   Primary osteoarthritis, left ankle and foot 02/05/2021   Allergy to shellfish 01/15/2021   Decreased estrogen level 01/15/2021   Diabetic renal disease 01/15/2021   Gallstones 01/15/2021   Idiopathic gout 01/15/2021   Mild intermittent asthma 01/15/2021   Pure hypercholesterolemia 01/15/2021   CHF (congestive heart failure) 01/07/2021   Chronic combined  systolic and diastolic CHF (congestive heart failure) 12/31/2020   Nasal congestion with rhinorrhea 10/24/2020   Carotid stenosis 10/01/2020   Ischemic cardiomyopathy 09/27/2020   Hypertension 09/19/2020   Acute left-sided weakness 09/16/2020   Blood blister 08/01/2020   Elevated troponin 06/01/2019   Irritable bowel syndrome with constipation 10/21/2018   Chronic pain 07/30/2018   Congestive heart failure (CHF) 07/30/2018   Supplemental oxygen dependent 07/30/2018   Leukopenia 05/03/2018   Thrombocytopenia 05/03/2018   Pneumonia due to human metapneumovirus 05/02/2018   Chest pain of uncertain etiology [id number]   Allergic rhinitis 04/13/2018   S/P laparoscopic cholecystectomy 04/09/2018   Respiratory failure 03/07/2018   Polyp of cecum    Polyp of transverse colon    Positive colorectal cancer screening using Cologuard test    Dysphagia    Acute on chronic renal failure 11/20/2017   Coronary artery disease of native artery of native heart with stable angina pectoris 11/20/2017   Diabetes mellitus without complication [id number]   Abdominal pain, epigastric 11/20/2017   Vasomotor rhinitis 10/16/2016   Right facial numbness 03/05/2016   ESRD (end stage renal disease) 03/05/2016   Paroxysmal atrial fibrillation 10/24/2015   Chest pain with moderate risk of acute coronary syndrome 10/23/2015   History of TIA (transient ischemic attack) 10/22/2015   CHB (complete heart block) 06/22/2015   Allergic drug  rash due to anti-infective agent 04/29/2015   Fatigue 02/14/2015   Chronic diastolic CHF (congestive heart failure) 02/14/2015   Cellulitis 12/21/2014   S/P CABG x 5 11/29/2014   CAD S/P percutaneous coronary angioplasty    Diarrhea 07/12/2014   Abdominal pain 07/12/2014   Acute on chronic diastolic heart failure [id number]   Mixed hypercholesterolemia and hypertriglyceridemia 04/22/2013   Vertigo 04/22/2013   Dyspnea on exertion 08/25/2012   Allergic to IV contrast  07/23/2012   Unstable angina 07/22/2012   Obesity 11/22/2011   Cardiomyopathy 11/22/2011   Gastroesophageal reflux disease 11/22/2011   Back pain 11/22/2011   OSA (obstructive sleep apnea) 11/22/2011   HEMORRHOIDS-EXTERNAL 02/21/2010   NAUSEA 02/21/2010   ABDOMINAL PAIN -GENERALIZED 02/21/2010   PERSONAL HX COLONIC POLYPS 02/21/2010   ANEMIA 04/16/2007   HTN (hypertension), benign 04/16/2007   DIVERTICULOSIS, COLON 04/16/2007   ARTHRITIS 04/16/2007   INTERNAL HEMORRHOIDS WITHOUT MENTION COMP 04/12/2007   Sleep apnea 04/12/2007   Cardiac resynchronization therapy pacemaker (CRT-P) in place 04/12/2007   Gastritis without bleeding 12/14/2006   COLONIC POLYPS, HYPERPLASTIC 09/27/2002   FATTY LIVER DISEASE 02/16/2002   PCP:  Lujean Amel, MD Pharmacy:  No Pharmacies Listed    Social Determinants of Health (SDOH) Social History: SDOH Screenings   Food Insecurity: No Food Insecurity (05/11/2022)  Housing: Low Risk  (05/11/2022)  Transportation Needs: No Transportation Needs (05/11/2022)  Utilities: Not At Risk (05/11/2022)  Depression (PHQ2-9): Low Risk  (11/19/2021)  Financial Resource Strain: High Risk (12/18/2016)  Physical Activity: Inactive (12/18/2016)  Social Connections: Moderately Integrated (02/05/2021)  Stress: Stress Concern Present (12/18/2016)  Tobacco Use: Medium Risk (05/12/2022)   SDOH Interventions:     Readmission Risk Interventions    03/21/2022    2:22 PM 01/10/2021    9:47 AM  Readmission Risk Prevention Plan  Transportation Screening Complete Complete  PCP or Specialist Appt within 3-5 Days  Complete  HRI or [place]  Complete  Social Work Consult for [place] Planning/Counseling  Complete  Palliative Care Screening  Not Applicable  Medication Review Press photographer) Complete Complete  PCP or Specialist appointment within 3-5 days of discharge Complete   HRI or [place] Complete   SW Recovery Care/Counseling Consult Complete    Palliative Care Screening Complete   Skilled Nursing Facility Complete

## 2022-05-14 NOTE — NC FL2 (Signed)
[place] LEVEL OF CARE FORM     IDENTIFICATION  Patient Name: [name] Birthdate: [birth year] Sex: female Admission Date (Current Location): 05/11/2022  [facility] and [place] Number:  Herbalist and Address:  [facility] [place],  [address], [place]      Provider Number: [id number]  Attending Physician Name and Address:  Geradine Girt, DO  Relative Name and Phone Number:       Current Level of Care: Hospital Recommended Level of Care: [place] Prior Approval Number:    Date Approved/Denied:   PASRR Number: [id number]  Discharge Plan: SNF    Current Diagnoses: Patient Active Problem List   Diagnosis Date Noted   Malnutrition of moderate degree 05/12/2022   Acute renal failure superimposed on stage 4 chronic kidney disease 05/11/2022   Pancreatic lesion 05/11/2022   Hepatic lesion 05/11/2022   UTI (urinary tract infection) 05/11/2022   Acute on chronic systolic CHF (congestive heart failure) 04/23/2022   Expressive aphasia 0000000   Acute metabolic encephalopathy 0000000   Asthma without acute exacerbation 04/21/2022   Heart failure with reduced ejection fraction 04/21/2022   Candida esophagitis 03/20/2022   Gastritis and gastroduodenitis 03/20/2022   Nausea without vomiting 03/20/2022   Heme positive stool 03/19/2022   Acute on chronic anemia 03/19/2022   Abdominal pain, chronic, epigastric 03/19/2022   Acute pancreatitis 03/17/2022   Dyslipidemia 03/17/2022   GERD without esophagitis 03/17/2022   Coronary artery disease 03/17/2022   Pain from implanted hardware 02/06/2022   Diabetic ulcer of toe 11/18/2021   Pain due to onychomycosis of toenails of both feet 11/18/2021   Pressure injury of back, stage 2 11/03/2021   Acute on chronic respiratory failure with hypoxia 11/02/2021   History of fracture of left hip 09/10/2021   Iatrogenic adrenal insufficiency 09/10/2021   T9 vertebral  fracture 09/09/2021   Acute kidney injury superimposed on chronic kidney disease 09/09/2021   Chronic hypoxemic respiratory failure 09/09/2021   Acute combined systolic and diastolic heart failure    Acute CHF (congestive heart failure) 08/04/2021   Callus 07/19/2021   Hypervolemia associated with renal insufficiency 06/11/2021   Physical deconditioning 06/11/2021   Subcutaneous fat necrosis    Pressure injury of skin 04/13/2021   Closed left hip fracture ([place]) secondary to fall at home 04/03/2021   Closed fracture of left superior pubic ramus 04/03/2021   Chronic kidney disease, stage III B (moderate) ([place]) 04/03/2021   Fall at home, initial encounter 04/03/2021   Hypokalemia 04/03/2021   Obesity hypoventilation syndrome 04/02/2021   Primary osteoarthritis, left ankle and foot 02/05/2021   Allergy to shellfish 01/15/2021   Decreased estrogen level 01/15/2021   Diabetic renal disease 01/15/2021   Gallstones 01/15/2021   Idiopathic gout 01/15/2021   Mild intermittent asthma 01/15/2021   Pure hypercholesterolemia 01/15/2021   CHF (congestive heart failure) 01/07/2021   Chronic combined systolic and diastolic CHF (congestive heart failure) 12/31/2020   Nasal congestion with rhinorrhea 10/24/2020   Carotid stenosis 10/01/2020   Ischemic cardiomyopathy 09/27/2020   Hypertension 09/19/2020   Acute left-sided weakness 09/16/2020   Blood blister 08/01/2020   Elevated troponin 06/01/2019   Irritable bowel syndrome with constipation 10/21/2018   Chronic pain 07/30/2018   Congestive heart failure (CHF) 07/30/2018   Supplemental oxygen dependent 07/30/2018   Leukopenia 05/03/2018   Thrombocytopenia 05/03/2018   Pneumonia due to human metapneumovirus 05/02/2018   Chest pain of uncertain etiology [id number]   Allergic rhinitis  04/13/2018   S/P laparoscopic cholecystectomy 04/09/2018   Respiratory failure 03/07/2018   Polyp of cecum    Polyp of transverse colon    Positive colorectal  cancer screening using Cologuard test    Dysphagia    Acute on chronic renal failure 11/20/2017   Coronary artery disease of native artery of native heart with stable angina pectoris 11/20/2017   Diabetes mellitus without complication [id number]   Abdominal pain, epigastric 11/20/2017   Vasomotor rhinitis 10/16/2016   Right facial numbness 03/05/2016   ESRD (end stage renal disease) 03/05/2016   Paroxysmal atrial fibrillation 10/24/2015   Chest pain with moderate risk of acute coronary syndrome 10/23/2015   History of TIA (transient ischemic attack) 10/22/2015   CHB (complete heart block) 06/22/2015   Allergic drug rash due to anti-infective agent 04/29/2015   Fatigue 02/14/2015   Chronic diastolic CHF (congestive heart failure) 02/14/2015   Cellulitis 12/21/2014   S/P CABG x 5 11/29/2014   CAD S/P percutaneous coronary angioplasty    Diarrhea 07/12/2014   Abdominal pain 07/12/2014   Acute on chronic diastolic heart failure [id number]   Mixed hypercholesterolemia and hypertriglyceridemia 04/22/2013   Vertigo 04/22/2013   Dyspnea on exertion 08/25/2012   Allergic to IV contrast 07/23/2012   Unstable angina 07/22/2012   Obesity 11/22/2011   Cardiomyopathy 11/22/2011   Gastroesophageal reflux disease 11/22/2011   Back pain 11/22/2011   OSA (obstructive sleep apnea) 11/22/2011   HEMORRHOIDS-EXTERNAL 02/21/2010   NAUSEA 02/21/2010   ABDOMINAL PAIN -GENERALIZED 02/21/2010   PERSONAL HX COLONIC POLYPS 02/21/2010   ANEMIA 04/16/2007   HTN (hypertension), benign 04/16/2007   DIVERTICULOSIS, COLON 04/16/2007   ARTHRITIS 04/16/2007   INTERNAL HEMORRHOIDS WITHOUT MENTION COMP 04/12/2007   Sleep apnea 04/12/2007   Cardiac resynchronization therapy pacemaker (CRT-P) in place 04/12/2007   Gastritis without bleeding 12/14/2006   COLONIC POLYPS, HYPERPLASTIC 09/27/2002   FATTY LIVER DISEASE 02/16/2002    Orientation RESPIRATION BLADDER Height & Weight     Self, Time, Situation,  Place  O2 (2L) Continent Weight: 182 lb 15.7 oz (83 kg) Height:  5\' 3"  (160 cm)  BEHAVIORAL SYMPTOMS/MOOD NEUROLOGICAL BOWEL NUTRITION STATUS      Continent Diet (Heart Healthy /Carb mod)  AMBULATORY STATUS COMMUNICATION OF NEEDS Skin   Extensive Assist Verbally PU Stage and Appropriate Care (Right Buttocks) PU Stage 1 Dressing:  (PRN)                     Personal Care Assistance Level of Assistance  Bathing, Feeding, Dressing Bathing Assistance: Limited assistance Feeding assistance: Independent Dressing Assistance: Limited assistance     Functional Limitations Info  Sight, Hearing, Speech Sight Info: Adequate Hearing Info: Adequate Speech Info: Adequate    SPECIAL CARE FACTORS FREQUENCY  PT (By licensed PT), OT (By licensed OT)     PT Frequency: 5 x a week OT Frequency: 5 x a week            Contractures Contractures Info: Not present    Additional Factors Info  Code Status, Allergies Code Status Info: full Allergies Info: Ivp Dye (iodinated Contrast Media),Shellfish Allergy,Sulfa Antibiotics,Iodine,Benadryl (diphenhydramine),Lipitor (atorvastatin),Lipitor (atorvastatin),Mitigare (colchicine),Uloric (febuxostat),Vibra-tab (doxycycline),Keflex (cephalexin),Zithromax (azithromycin)           Current Medications (05/14/2022):  This is the current hospital active medication list Current Facility-Administered Medications  Medication Dose Route Frequency Provider Last Rate Last Admin   (feeding supplement) PROSource Plus liquid 30 mL  30 mL Oral BID BM [place] Donnamarie Poag, DO  30 mL at 05/14/22 1052   0.9 %  sodium chloride infusion   Intravenous Continuous Geradine Girt, DO 75 mL/hr at 05/14/22 1603 New Bag at 05/14/22 1603   acetaminophen (TYLENOL) tablet 650 mg  650 mg Oral Q6H PRN Lenore Cordia, MD   650 mg at 05/13/22 1207   Or   acetaminophen (TYLENOL) suppository 650 mg  650 mg Rectal Q6H PRN Lenore Cordia, MD       albuterol (PROVENTIL) (2.5 MG/3ML)  0.083% nebulizer solution 2.5 mg  2.5 mg Nebulization Q6H PRN Lenore Cordia, MD       amoxicillin (AMOXIL) capsule 500 mg  500 mg Oral Q12H Vann, Jessica U, DO   500 mg at 05/14/22 1109   aspirin EC tablet 81 mg  81 mg Oral Daily Lenore Cordia, MD   81 mg at 05/14/22 1108   camphor-menthol (SARNA) lotion   Topical PRN Geradine Girt, DO       hydrOXYzine (ATARAX) tablet 25 mg  25 mg Oral TID PRN [place] Eric J, DO   25 mg at 05/14/22 0126   insulin aspart (novoLOG) injection 0-9 Units  0-9 Units Subcutaneous TID WC Zada Finders R, MD   2 Units at 05/14/22 1325   isosorbide mononitrate (IMDUR) 24 hr tablet 30 mg  30 mg Oral Daily Zada Finders R, MD   30 mg at 05/14/22 1052   lip balm (CARMEX) ointment   Topical PRN [place] Eric J, DO   75 Application at [id number]   methocarbamol (ROBAXIN) tablet 500 mg  500 mg Oral Q6H PRN Lenore Cordia, MD   500 mg at 05/13/22 1206   mometasone-formoterol (DULERA) 200-5 MCG/ACT inhaler 2 puff  2 puff Inhalation BID Lenore Cordia, MD   2 puff at 05/14/22 0840   ondansetron (ZOFRAN) tablet 4 mg  4 mg Oral Q6H PRN Lenore Cordia, MD       Or   ondansetron (ZOFRAN) injection 4 mg  4 mg Intravenous Q6H PRN Lenore Cordia, MD   4 mg at 05/14/22 0646   Oral care mouth rinse  15 mL Mouth Rinse PRN Lenore Cordia, MD       pantoprazole (PROTONIX) EC tablet 40 mg  40 mg Oral BID AC Patel, Vishal R, MD   40 mg at 05/14/22 1052   rosuvastatin (CRESTOR) tablet 10 mg  10 mg Oral QPM Zada Finders R, MD   10 mg at 05/13/22 1922   senna-docusate (Senokot-S) tablet 1 tablet  1 tablet Oral QHS PRN Lenore Cordia, MD       sodium chloride flush (NS) 0.9 % injection 3 mL  3 mL Intravenous Q12H Lenore Cordia, MD   3 mL at 05/14/22 1109     Discharge Medications: Please see discharge summary for a list of discharge medications.  Relevant Imaging Results:  Relevant Lab Results:   Additional Information [id number]  [place],  LCSW

## 2022-05-15 DIAGNOSIS — N3001 Acute cystitis with hematuria: Secondary | ICD-10-CM | POA: Diagnosis not present

## 2022-05-15 DIAGNOSIS — K8689 Other specified diseases of pancreas: Secondary | ICD-10-CM | POA: Diagnosis not present

## 2022-05-15 DIAGNOSIS — E876 Hypokalemia: Secondary | ICD-10-CM | POA: Diagnosis not present

## 2022-05-15 LAB — BASIC METABOLIC PANEL
Anion gap: 5 (ref 5–15)
BUN: 38 mg/dL — ABNORMAL HIGH (ref 8–23)
CO2: 20 mmol/L — ABNORMAL LOW (ref 22–32)
Calcium: 8.2 mg/dL — ABNORMAL LOW (ref 8.9–10.3)
Chloride: 111 mmol/L (ref 98–111)
Creatinine, Ser: 2.48 mg/dL — ABNORMAL HIGH (ref 0.44–1.00)
GFR, Estimated: 19 mL/min — ABNORMAL LOW (ref >60)
Glucose, Bld: 123 mg/dL — ABNORMAL HIGH (ref 70–99)
Potassium: 3.9 mmol/L (ref 3.5–5.1)
Sodium: 136 mmol/L (ref 135–145)

## 2022-05-15 LAB — GLUCOSE, CAPILLARY
Glucose-Capillary: 129 mg/dL — ABNORMAL HIGH (ref 70–99)
Glucose-Capillary: 148 mg/dL — ABNORMAL HIGH (ref 70–99)
Glucose-Capillary: 195 mg/dL — ABNORMAL HIGH (ref 70–99)
Glucose-Capillary: 89 mg/dL (ref 70–99)

## 2022-05-15 LAB — CBC
HCT: 31.6 % — ABNORMAL LOW (ref 36.0–46.0)
Hemoglobin: 9.4 g/dL — ABNORMAL LOW (ref 12.0–15.0)
MCH: 27.6 pg (ref 26.0–34.0)
MCHC: 29.7 g/dL — ABNORMAL LOW (ref 30.0–36.0)
MCV: 92.9 fL (ref 80.0–100.0)
Platelets: 43 10*3/uL — ABNORMAL LOW (ref 150–400)
RBC: 3.4 MIL/uL — ABNORMAL LOW (ref 3.87–5.11)
RDW: 19.9 % — ABNORMAL HIGH (ref 11.5–15.5)
WBC: 7.7 10*3/uL (ref 4.0–10.5)
nRBC: 0 % (ref 0.0–0.2)

## 2022-05-15 MED ORDER — ISOSORBIDE MONONITRATE ER 60 MG PO TB24: 30.0000 mg | ORAL_TABLET | Freq: Every day | ORAL | Status: DC

## 2022-05-15 MED ORDER — SALINE SPRAY 0.65 % NA SOLN
1.0000 | NASAL | Status: DC | PRN
  Administered 2022-05-15: 1 via NASAL
  Filled 2022-05-15: qty 44

## 2022-05-15 MED ORDER — CAMPHOR-MENTHOL 0.5-0.5 % EX LOTN: TOPICAL_LOTION | CUTANEOUS | 0 refills | Status: DC | PRN

## 2022-05-15 MED ORDER — OXYCODONE HCL 5 MG PO TABS: 5.0000 mg | ORAL_TABLET | Freq: Four times a day (QID) | ORAL | 0 refills | Status: DC | PRN

## 2022-05-15 NOTE — Progress Notes (Signed)
Notified pharmacy regarding incomplete PTA med list. Per pharmacist, Theodis Shove, after multiple attempts pharmacy staff has been unable to reach facility to complete.  See PTA med list notes by pharmacy staff. Harry Shuck, Laurel Dimmer, RN

## 2022-05-15 NOTE — Plan of Care (Signed)
  Problem: Education: Goal: Knowledge of General Education information will improve Description: Including pain rating scale, medication(s)/side effects and non-pharmacologic comfort measures Outcome: Completed/Met   Problem: Activity: Goal: Risk for activity intolerance will decrease Outcome: Progressing   Problem: Pain Managment: Goal: General experience of comfort will improve Outcome: Completed/Met

## 2022-05-15 NOTE — Discharge Summary (Addendum)
Physician Discharge Summary  [name] [id number] DOB: [birth year] DOA: 05/11/2022  PCP: Lujean Amel, MD  Admit date: 05/11/2022 Discharge date: 05/15/2022  Discharge disposition: SNF   Recommendations for Outpatient Follow-Up:   Brilinta 90 mg - stop date 4/15 Will need close follow up with medical oncology for pancreatic lesion after finishes brilenta BMP 1 week   Discharge Diagnosis:   Principal Problem:   Acute renal failure superimposed on stage 4 chronic kidney disease Active Problems:   Pancreatic lesion   UTI (urinary tract infection)   Chronic hypoxemic respiratory failure   Asthma without acute exacerbation   Hypokalemia   CHB (complete heart block)   Paroxysmal atrial fibrillation   Thrombocytopenia   Diabetes mellitus without complication   Sleep apnea   Mixed hypercholesterolemia and hypertriglyceridemia   Hypertension   Chronic combined systolic and diastolic CHF (congestive heart failure)   Coronary artery disease   Hepatic lesion   Malnutrition of moderate degree    Discharge Condition: Improved.  Diet recommendation: Low sodium, heart healthy.  Carbohydrate-modified  Wound care: routine/ local  Code status: Full.   History of Present Illness:   [name] is a 77 y.o. female with medical history significant for chronic combined systolic and diastolic CHF (EF [id number] %), CHB s/p CRT-D, CKD stage IV, CAD s/p CABG, PAF not on anticoagulation, chronic respiratory failure with hypoxia on 3 L [place], asthma, T2DM, HTN, HLD, chronic thrombocytopenia, OSA intolerant to CPAP who presented to the ED for evaluation of abdominal pain and dysuria.   Patient recently admitted 04/21/2022-04/25/2022 for encephalopathy and expressive aphasia.  CT head was negative x 2 for any acute stroke.  MRI unable to be performed due to pacemaker.  CTA not performed due to contrast allergy.  Transcranial and carotid Dopplers were unremarkable.  Her Plavix was  discontinued and she was switched to DAPT with aspirin and Brilinta for 4 weeks to be followed by aspirin alone.  PT/OT recommended SNF however insurance denied it and patient returned back to long-term care.   Patient states that she has been having 1 day of epigastric abdominal pain without nausea or vomiting.  She has had dysuria as well.  She denies any diarrhea, fevers, chills, diaphoresis.  She has had some exertional dyspnea.  Denies chest pain.  She says she hit her left foot resulting in a laceration to her second left toe which has been bleeding.   Patient reportedly checked herself out of her long-term care facility and has been at home mostly immobile on the couch unable to care for herself.  She admits that it was a mistake to have left the facility.   Hospital Course by Problem:   Acute renal failure superimposed on CKD stage IV: Creatinine on admission 3.61 with baseline 2.0-2.2.  Appears largely euvolemic on admission.  Does not appear to be on any offending medications. CT abdomen/pelvis with no hydronephrosis.  Renal ultrasound with bilateral increased renal cortical echotexture consistent with medical renal disease, bilateral simple appearing renal cyst. -- Cr trending down to normal   1.4 cm pancreatic head lesion Innumerable new hepatic hypodensities: Findings noted on CT imaging.  Pancreatic head lesion concerning for primary pancreatic neoplasm and hepatic lesions suspicious for metastatic disease.  Discussed findings with patient.  aFP less than 1.8 -- CA 19-9 elevated-- patient updated -- Will need outpatient follow-up with [facility], medical oncology   Proteus Mirabilis urinary tract infection: Urinalysis with large leukocytes, negative nitrite,  many bacteria, greater than 50 WBCs.  -- Urine culture: + >100K Proteus -- Ceftriaxone 1 g IV changed to PO abx to finish course on 4/4   Chronic combined systolic and diastolic CHF CHB s/p CRT-D: Appears euvolemic on  admission.  Does not appear to be on diuretics or usual GDMT as an outpatient.   Paroxysmal atrial fibrillation: Low burden A-fib mostly in paced rhythm chronically.  Not on anticoagulation.  Recently placed on aspirin/Brilinta. -- Continue aspirin and Brilinta until stop date   CAD s/p CABG: Stable, denies chest pain.   -- Continue Imdur (increase dose) and aspirin.     Hypokalemia: -replete   Recent admit for expressive aphasia/TIA?: She was started on DAPT with aspirin/Brilinta x 4 weeks to be followed by aspirin alone for presumed CVA.  Workup was limited.  Unable to obtain MRI due to pacemaker and could not obtain CTA due to contrast allergy. -- Continue aspirin/Brilinta and rosuvastatin   Thrombocytopenia: Stable, continue to monitor.   Renal cyst: Seen again on CT imaging.  Renal ultrasound with bilateral simple appearing renal cysts, no follow-up required.   Chronic respiratory failure with hypoxia Asthma: Stable on home 3 L O2 via [place].  No wheezing.  Continue Dulera and albuterol as needed.   Laceration left second toe: Right Buttock stage I pressure injury, POA Pressure Injury 05/11/22 Buttocks Right;Left Stage 1 -  Intact skin with non-blanchable redness of a localized area usually over a bony prominence. (Active)  05/11/22 2326  Location: Buttocks  Location Orientation: Right;Left  Staging: Stage 1 -  Intact skin with non-blanchable redness of a localized area usually over a bony prominence.  Wound Description (Comments):   Present on Admission: Yes  X-ray negative for evidence of fracture or dislocation.  Continue wound care.   Type 2 diabetes: Well-controlled with last A1c 5.8% on 04/22/2022.     Hypertension: Continue Imdur at higher dose   Hyperlipidemia: Continue rosuvastatin 10 mg p.o. nightly.   Obstructive sleep apnea: Intolerant of CPAP.  Continue supplemental O2 via [place] at 3L [place].   Weakness/debility/deconditioning: Patient previously a resident at  a long-term care facility; now residing at home alone. --PT/OT evaluation recommend SNF placement -- TOC consulted for placement        Medical Consultants:      Discharge Exam:   Vitals:   05/14/22 2000 05/15/22 0452  BP: (!) 158/77 (!) 150/88  Pulse: 78 (!) 59  Resp: 20 20  Temp: 98.2 F (36.8 C) 97.9 F (36.6 C)  SpO2: 100% 100%   Vitals:   05/14/22 0840 05/14/22 1425 05/14/22 2000 05/15/22 0452  BP:  (!) 149/60 (!) 158/77 (!) 150/88  Pulse:  91 78 (!) 59  Resp:   20 20  Temp:  98.4 F (36.9 C) 98.2 F (36.8 C) 97.9 F (36.6 C)  TempSrc:  Oral Oral Oral  SpO2: 99% 100% 100% 100%  Weight:    81 kg  Height:        General exam: Appears calm and comfortable.    The results of significant diagnostics from this hospitalization (including imaging, microbiology, ancillary and laboratory) are listed below for reference.     Procedures and Diagnostic Studies:   DG HIP UNILAT W OR W/O PELVIS 2-3 VIEWS LEFT  Result Date: 05/12/2022 CLINICAL DATA:  Postoperative left hip surgery 02/13/2022 EXAM: DG HIP (WITH OR WITHOUT PELVIS) 2-3V LEFT COMPARISON:  CT abdomen pelvis 05/11/2022, left hip radiographs 02/17/2022 FINDINGS: Redemonstration of total left hip  arthroplasty with moderately long femoral stem. Lateral hook plate and cerclage wiring around the proximal to mid aspects of the femoral stem, similar to prior. No perihardware lucency is seen to indicate hardware failure or loosening. Mild superolateral right femoroacetabular joint space narrowing and peripheral osteophytosis. IMPRESSION: 1. Status post total left hip arthroplasty without evidence of hardware failure. 2. Mild right femoroacetabular osteoarthritis. Electronically Signed   By: Yvonne Kendall M.D.   On: 05/12/2022 14:24   US RENAL  Result Date: 05/11/2022 CLINICAL DATA:  Acute on chronic renal insufficiency EXAM: RENAL / URINARY TRACT ULTRASOUND COMPLETE COMPARISON:  05/11/2022 FINDINGS: Right Kidney: Renal  measurements: 10.0 x 4.6 x 4.7 cm = volume: 111.4 mL. Increased renal cortical echotexture consistent with medical renal disease. No hydronephrosis or nephrolithiasis. There are 2 simple renal cysts identified, largest in the lower pole measuring 2.7 x 2.0 x 2.3 cm, and a smaller in the upper pole measuring 2.2 x 1.4 x 1.7 cm. Left Kidney: Renal measurements: 8.8 x 4.2 by 4.9 cm = volume: 93.9 mL. Increased renal cortical echotexture. No hydronephrosis or nephrolithiasis. Exophytic simple cyst off the mid left kidney measures 1.7 by 1.3 by 1.5 cm. There is also a 1.3 cm simple cortical cyst lower pole left kidney. Bladder: Appears normal for degree of bladder distention. Other: None. IMPRESSION: 1. Bilateral increased renal cortical echotexture consistent with medical renal disease. 2. Bilateral simple appearing renal cysts. No specific imaging follow-up is required. Electronically Signed   By: Randa Ngo M.D.   On: 05/11/2022 23:20   CT ABDOMEN PELVIS WO CONTRAST  Result Date: 05/11/2022 CLINICAL DATA:  Epigastric pain EXAM: CT ABDOMEN AND PELVIS WITHOUT CONTRAST TECHNIQUE: Multidetector CT imaging of the abdomen and pelvis was performed following the standard protocol without IV contrast. RADIATION DOSE REDUCTION: This exam was performed according to the departmental dose-optimization program which includes automated exposure control, adjustment of the mA and/or kV according to patient size and/or use of iterative reconstruction technique. COMPARISON:  CT abdomen and pelvis dated 03/17/2022 FINDINGS: Lower chest: No focal consolidation or pulmonary nodule in the lung bases. No pleural effusion or pneumothorax demonstrated. Partially imaged heart size is normal. Partially imaged median sternotomy wires are nondisplaced. Hepatobiliary: Innumerable new hepatic hypodensities measuring up to 3.0 cm (2:16). No intra or extrahepatic biliary ductal dilation. Cholecystectomy. Pancreas: Ill-defined 1.4 cm  hypoattenuating focus within the pancreatic head (2:29), not well seen on prior examination. No main pancreatic ductal dilation. Spleen: Normal in size without focal abnormality. Adrenals/Urinary Tract: No adrenal nodules. Bilateral renal cortical thinning. No hydronephrosis or calculi. 2.2 cm right lower pole exophytic lesion does not demonstrate simple fluid attenuation. Additional bilateral simple-appearing renal cysts. No specific follow-up imaging recommended. No focal bladder wall thickening. Stomach/Bowel: Normal appearance of the stomach. No evidence of bowel wall thickening, distention, or inflammatory changes. Colonic diverticulosis without acute diverticulitis. Appendix is not discretely seen. Vascular/Lymphatic: Aortic atherosclerosis. Retroaortic left renal vein. No enlarged abdominal or pelvic lymph nodes. Reproductive: No adnexal masses. Other: Mild presacral edema.  No free air. Musculoskeletal: Lytic focus along the medial left acetabulum (2:66) measuring 2.3 cm at the site of prior compression screw. Status post left hip arthroplasty. Postsurgical changes from partially imaged thoracic spinal fixation. Unchanged fracture of T9. Multilevel degenerative changes of the partially imaged thoracic and lumbar spine. Unchanged grade 1 anterolisthesis at L5-S1. IMPRESSION: 1. Innumerable new hepatic hypodensities measuring up to 3.0 cm suspicious for metastatic disease. 2. Ill-defined 1.4 cm hypoattenuating focus within the pancreatic head, not  well seen on prior examination, suspicious for primary pancreatic neoplasm. 3. Lytic focus along the medial left acetabulum measuring up to 2.3 cm at the site of prior compression screw may be postsurgical. 4. Indeterminate 2.2 cm right renal lower pole exophytic lesion. Given the patient's history of contraindication to MRI and contrast-enhanced CT, ultrasound of the kidneys can be considered for further characterization. 5.  Aortic Atherosclerosis (ICD10-I70.0).  These results were called by telephone at the time of interpretation on 05/11/2022 at 7:37 pm to provider [facility] , who verbally acknowledged these results. Electronically Signed   By: Darrin Nipper M.D.   On: 05/11/2022 19:41   DG Chest Portable 1 View  Result Date: 05/11/2022 CLINICAL DATA:  Shortness of breath EXAM: PORTABLE CHEST 1 VIEW COMPARISON:  04/21/2022 FINDINGS: Cardiomegaly. Prior CABG. Right pacer remains in place, unchanged. No confluent airspace opacities, effusions or edema. Aortic atherosclerosis. No acute bony abnormality. IMPRESSION: Cardiomegaly.  No active disease. Electronically Signed   By: Rolm Baptise M.D.   On: 05/11/2022 17:50   DG Foot Complete Left  Result Date: 05/11/2022 CLINICAL DATA:  Wounds to the second and third digits EXAM: LEFT FOOT - COMPLETE 3 VIEW COMPARISON:  None Available. FINDINGS: There is no definite evidence of fracture or dislocation. Old fracture of the partially imaged distal fibula. Degenerative changes of the foot. Soft tissues are unremarkable. IMPRESSION: 1. No definite evidence of fracture or dislocation. 2. Old fracture of the partially imaged distal fibula. 3. No soft tissue abnormality to correspond to reported wounds. Electronically Signed   By: Darrin Nipper M.D.   On: 05/11/2022 17:49     Labs:   Basic Metabolic Panel: Recent Labs  Lab 05/11/22 1601 05/12/22 0423 05/13/22 0436 05/14/22 0437 05/15/22 0414  NA 136 136 137 136 136  K 2.1* 3.1* 4.0 4.0 3.9  CL 102 105 109 110 111  CO2 23 23 21* 20* 20*  GLUCOSE 128* 114* 121* 126* 123*  BUN 60* 57* 55* 48* 38*  CREATININE 3.61* 3.26* 3.05* 2.80* 2.48*  CALCIUM 8.8* 8.4* 8.2* 8.1* 8.2*  MG 2.2  --  2.3  --   --    GFR Estimated Creatinine Clearance: 18.8 mL/min (A) (by C-G formula based on SCr of 2.48 mg/dL (H)). Liver Function Tests: Recent Labs  Lab 05/11/22 2258 05/12/22 0423  AST 41 26  ALT 24 22  ALKPHOS 455* 395*  BILITOT 1.0 0.4  PROT 6.3* 5.0*  ALBUMIN 2.4*  2.0*   Recent Labs  Lab 05/11/22 2258  LIPASE 73*   No results for input(s): "AMMONIA" in the last 168 hours. Coagulation profile No results for input(s): "INR", "PROTIME" in the last 168 hours.  CBC: Recent Labs  Lab 05/11/22 1601 05/12/22 0423 05/15/22 0414  WBC 11.0* 9.4 7.7  NEUTROABS 9.0*  --   --   HGB 11.5* 10.1* 9.4*  HCT 36.7 32.2* 31.6*  MCV 89.5 89.4 92.9  PLT 93* 81* 43*   Cardiac Enzymes: No results for input(s): "CKTOTAL", "CKMB", "CKMBINDEX", "TROPONINI" in the last 168 hours. BNP: Invalid input(s): "POCBNP" CBG: Recent Labs  Lab 05/14/22 0836 05/14/22 1307 05/14/22 1627 05/14/22 2121 05/15/22 0741  GLUCAP 122* 134* 149* 127* 129*   D-Dimer No results for input(s): "DDIMER" in the last 72 hours. Hgb A1c No results for input(s): "HGBA1C" in the last 72 hours. Lipid Profile No results for input(s): "CHOL", "HDL", "LDLCALC", "TRIG", "CHOLHDL", "LDLDIRECT" in the last 72 hours. Thyroid function studies No results for input(s): "  TSH", "T4TOTAL", "T3FREE", "THYROIDAB" in the last 72 hours.  Invalid input(s): "FREET3" Anemia work up No results for input(s): "VITAMINB12", "FOLATE", "FERRITIN", "TIBC", "IRON", "RETICCTPCT" in the last 72 hours. Microbiology Recent Results (from the past 240 hour(s))  Urine Culture (for pregnant, neutropenic or urologic patients or patients with an indwelling urinary catheter)     Status: Abnormal   Collection Time: 05/11/22  7:07 PM   Specimen: Urine, Clean Catch  Result Value Ref Range Status   Specimen Description   Final    URINE, CLEAN CATCH Performed at [place] [address], [place], [place] [address]    Special Requests   Final    NONE Performed at [facility], [place] [address] [place], [place] [address]    Culture >=100,000 COLONIES/mL PROTEUS MIRABILIS (A)  Final   Report Status 05/14/2022 FINAL  Final   Organism ID, Bacteria PROTEUS MIRABILIS (A)  Final       Susceptibility   Proteus mirabilis - MIC*    AMPICILLIN <=2 SENSITIVE Sensitive     CEFAZOLIN <=4 SENSITIVE Sensitive     CEFEPIME <=0.12 SENSITIVE Sensitive     CEFTRIAXONE <=0.25 SENSITIVE Sensitive     CIPROFLOXACIN >=4 RESISTANT Resistant     GENTAMICIN <=1 SENSITIVE Sensitive     IMIPENEM 2 SENSITIVE Sensitive     NITROFURANTOIN 128 RESISTANT Resistant     TRIMETH/SULFA <=20 SENSITIVE Sensitive     AMPICILLIN/SULBACTAM <=2 SENSITIVE Sensitive     PIP/TAZO <=4 SENSITIVE Sensitive     * >=100,000 COLONIES/mL PROTEUS MIRABILIS     Discharge Instructions:   Discharge Instructions     Diet - low sodium heart healthy   Complete by: As directed    Diet Carb Modified   Complete by: As directed    Discharge wound care:   Complete by: As directed    Routine wound care   Increase activity slowly   Complete by: As directed       Allergies as of 05/15/2022       Reactions   Ivp Dye [iodinated Contrast Media] Shortness Of Breath   No reaction to PO contrast with non-ionic dye.06-25-2014/rsm   Shellfish Allergy Anaphylaxis   Sulfa Antibiotics Shortness Of Breath   Iodine Hives   Benadryl [diphenhydramine] Other (See Comments)   "THIS DRIVES ME CRAZY AND MAKES ME FEEL LIKE I AM DYING"   Lipitor [atorvastatin] Other (See Comments)   Myalgias    Mitigare [colchicine] Nausea And Vomiting   Uloric [febuxostat] Other (See Comments)   Unknown reaction Documented on MAR   Vibra-tab [doxycycline] Other (See Comments)   Unknown reaction Documented on MAR   Keflex [cephalexin] Itching, Rash   Zithromax [azithromycin] Rash        Medication List     STOP taking these medications    sucralfate 1 GM/10ML suspension Commonly known as: CARAFATE       TAKE these medications    acetaminophen 325 MG tablet Commonly known as: TYLENOL Take 2 tablets (650 mg total) by mouth every 6 (six) hours as needed for mild pain (or Fever >/= 101).   albuterol 108 (90 Base) MCG/ACT  inhaler Commonly known as: VENTOLIN HFA Inhale 2 puffs into the lungs every 6 (six) hours as needed for wheezing or shortness of breath.   albuterol (2.5 MG/3ML) 0.083% nebulizer solution Commonly known as: PROVENTIL Take 3 mLs (2.5 mg total) by nebulization every 6 (six) hours as needed for wheezing or shortness of  breath.   allopurinol 100 MG tablet Commonly known as: ZYLOPRIM Take 1 tablet (100 mg total) by mouth daily. Please contact the office to schedule appointment for additional refills. 1st Attempt. What changed: additional instructions   aspirin EC 81 MG tablet Take 1 tablet (81 mg total) by mouth daily. Swallow whole.   barrier cream Crea Commonly known as: non-specified Apply 1 Application topically See admin instructions. Apply topically to buttocks every shift and as needed for each incontinence episode.   budesonide-formoterol 160-4.5 MCG/ACT inhaler Commonly known as: SYMBICORT Inhale 1 puff into the lungs 2 (two) times daily.   camphor-menthol lotion Commonly known as: SARNA Apply topically as needed for itching.   carboxymethylcellulose 0.5 % Soln Commonly known as: REFRESH PLUS Place 1 drop into both eyes 3 (three) times daily as needed (dry eyes).   cyanocobalamin 1000 MCG tablet Commonly known as: VITAMIN B12 Take 1 tablet (1,000 mcg total) by mouth daily.   cycloSPORINE 0.05 % ophthalmic emulsion Commonly known as: RESTASIS Place 1 drop into both eyes 2 (two) times daily.   dicyclomine 10 MG capsule Commonly known as: BENTYL Take 1 capsule (10 mg total) by mouth every 6 (six) hours as needed for spasms.   ferrous sulfate 325 (65 FE) MG tablet Take 1 tablet (325 mg total) by mouth daily with breakfast.   fluticasone 50 MCG/ACT nasal spray Commonly known as: FLONASE Place 1 spray into both nostrils daily as needed for allergies.   folic acid 1 MG tablet Commonly known as: FOLVITE Take 1 tablet (1 mg total) by mouth daily.   Gerhardt's butt  cream Crea Apply 1 Application topically 2 (two) times daily.   hydrOXYzine 25 MG tablet Commonly known as: ATARAX Take 25 mg by mouth 2 (two) times daily as needed for itching.   isosorbide mononitrate 60 MG 24 hr tablet Commonly known as: IMDUR Take 0.5 tablets (30 mg total) by mouth daily. Start taking on: May 16, 2022 What changed: medication strength   lidocaine 4 % Place 1 patch onto the skin daily as needed (pain).   methocarbamol 500 MG tablet Commonly known as: ROBAXIN Take 500 mg by mouth every 6 (six) hours as needed for muscle spasms.   Mylanta Coat & Cool 1200-270-80 MG/10ML Susp Generic drug: Cal Carb-Mag Hydrox-Simeth Take 30 mLs by mouth 3 (three) times daily as needed (gas pains).   naloxegol oxalate 12.5 MG Tabs tablet Commonly known as: MOVANTIK Take 1 tablet (12.5 mg total) by mouth daily.   nitroGLYCERIN 0.4 MG SL tablet Commonly known as: NITROSTAT Place 1 tablet (0.4 mg total) under the tongue every 5 (five) minutes as needed for chest pain. What changed: when to take this   ondansetron 4 MG tablet Commonly known as: ZOFRAN Take 4 mg by mouth every 8 (eight) hours as needed for nausea or vomiting.   oxyCODONE 5 MG immediate release tablet Commonly known as: Oxy IR/ROXICODONE Take 1 tablet (5 mg total) by mouth every 6 (six) hours as needed for severe pain.   OXYGEN Inhale 2-3 L/min into the lungs continuous.   oxymetazoline 0.05 % nasal spray Commonly known as: AFRIN Place 1 spray into both nostrils 2 (two) times daily as needed for congestion.   pantoprazole 40 MG tablet Commonly known as: PROTONIX Take 1 tablet (40 mg total) by mouth 2 (two) times daily.   polyethylene glycol powder 17 GM/SCOOP powder Commonly known as: GLYCOLAX/MIRALAX Take 17 g by mouth daily as needed (constipation).   PREPARATION H  RAPID RELIEF RE Place 1 spray rectally every 6 (six) hours as needed (hemorrhoids).   rosuvastatin 40 MG tablet Commonly known as:  CRESTOR Take 1 tablet (40 mg total) by mouth every evening.   simethicone 80 MG chewable tablet Commonly known as: MYLICON Chew 80 mg by mouth 4 (four) times daily as needed for flatulence.   ticagrelor 90 MG Tabs tablet Commonly known as: BRILINTA Take 1 tablet (90 mg total) by mouth 2 (two) times daily.               Discharge Care Instructions  (From admission, onward)           Start     Ordered   05/15/22 0000  Discharge wound care:       Comments: Routine wound care   05/15/22 0933              Time coordinating discharge: 45 min  Signed:  Geradine Girt DO  [facility] 05/15/2022, 9:34 AM

## 2022-05-15 NOTE — TOC Progression Note (Signed)
Transition of Care [facility]) - Progression Note    Patient Details  Name: [name] MRN: [id number] Date of Birth: [birth year]  [facility]) CM/SW [place], LCSW Phone Number: 05/15/2022, 12:42 PM  Clinical Narrative:    Insurance auth pending. TOC to follow   Expected Discharge Plan: [place] Barriers to Discharge: Continued Medical Work up  Expected Discharge Plan and Services       Living arrangements for the past 2 months: Apartment Expected Discharge Date: 05/15/22                                     Social Determinants of Health (SDOH) Interventions SDOH Screenings   Food Insecurity: No Food Insecurity (05/11/2022)  Housing: Low Risk  (05/11/2022)  Transportation Needs: No Transportation Needs (05/11/2022)  Utilities: Not At Risk (05/11/2022)  Depression (PHQ2-9): Low Risk  (11/19/2021)  Financial Resource Strain: High Risk (12/18/2016)  Physical Activity: Inactive (12/18/2016)  Social Connections: Moderately Integrated (02/05/2021)  Stress: Stress Concern Present (12/18/2016)  Tobacco Use: Medium Risk (05/12/2022)    Readmission Risk Interventions    03/21/2022    2:22 PM 01/10/2021    9:47 AM  Readmission Risk Prevention Plan  Transportation Screening Complete Complete  PCP or Specialist Appt within 3-5 Days  Complete  HRI or [place]  Complete  Social Work Consult for [place] Planning/Counseling  Complete  Palliative Care Screening  Not Applicable  Medication Review Press photographer) Complete Complete  PCP or Specialist appointment within 3-5 days of discharge Complete   HRI or [place] Complete   SW Recovery Care/Counseling Consult Complete   Palliative Care Screening Complete   Skilled Nursing Facility Complete

## 2022-05-15 NOTE — Progress Notes (Signed)
Physical Therapy Treatment Patient Details Name: [name] MRN: [id number] DOB: 1945/09/02 Today's Date: 05/15/2022   History of Present Illness Patient is a 77 year old female who presented on 3/31 with epigastric pain. Patient was found at home unable to take care of her self per MD note. Patient was admitted with acute renal failure superimposed on CKD IV, 1.4 cm pancreatic head lesion, innumerable new hepatic hypodensities, UTI. PMH: recent hospitalization 04/21/2022-04/25/2022 for encephalopathy and expressive aphasia, CAD s/p CABG, hypokalemia, COVID 19, a fib,    PT Comments    General Comments: AxO x 3 pt familiar from prior admit.  Required MAX encouragement to participate.  "I don't want to".  "I feel so weak".  "I'm afraid I will fall".  Pt's appearance with significant weight loss since I saw her last.   General bed mobility comments: required increased asisst to transition from supine to EOB using bed pad to complete scooting.  Once upright, pt was able to static sit at Supervision level but MAX c/o fatigue and increased dyspnea with activity.  Remained on 2 lts sats avg 98%. Assisted OOB was difficult and required + 2 assist.  General transfer comment: assisted pt from EOB to [facility] 1/4 pivot + 2 Mod Assist and increased time.  VERY WEAK.  Limited stance time.  Assisted back to bed 1/4 pivot.  Fatigues quickly. Pt will need ST Rehab at SNF to address mobility and functional decline prior to safely returning home.    Recommendations for follow up therapy are one component of a multi-disciplinary discharge planning process, led by the attending physician.  Recommendations may be updated based on patient status, additional functional criteria and insurance authorization.  Follow Up Recommendations  Can patient physically be transported by private vehicle: No    Assistance Recommended at Discharge    Patient can return home with the following Assist for transportation;Assistance  with cooking/housework;Help with stairs or ramp for entrance;Two people to help with bathing/dressing/bathroom;Two people to help with walking and/or transfers   Equipment Recommendations  None recommended by PT    Recommendations for Other Services       Precautions / Restrictions Precautions Precautions: Fall Precaution Comments: Home oxygen Restrictions Weight Bearing Restrictions: No LLE Weight Bearing: Weight bearing as tolerated Other Position/Activity Restrictions: per secure chat with Dr.Marchwiany, patient is now WBAT and has no precautions     Mobility  Bed Mobility Overal bed mobility: Needs Assistance Bed Mobility: Supine to Sit, Sit to Supine     Supine to sit: Mod assist, Max assist, +2 for physical assistance, +2 for safety/equipment Sit to supine: Max assist, Total assist, +2 for physical assistance, +2 for safety/equipment   General bed mobility comments: required increased asisst to transition from supine to EOB using bed pad to complete scooting.  Once upright, pt was able to static sit at Supervision level but MAX c/o fatigue and increased dyspnea with activity.  Remained on 2 lts sats avg 98%.    Transfers Overall transfer level: Needs assistance Equipment used: None Transfers: Sit to/from Stand Sit to Stand: Max assist, +2 physical assistance, +2 safety/equipment           General transfer comment: assisted pt from EOB to [facility] 1/4 pivot + 2 Mod Assist and increased time.  VERY WEAK.  Limited stance time.  Assisted back to bed 1/4 pivot.  Fatigues quickly.    Ambulation/Gait               General Gait  Details: unable due to profound weakness   Stairs             Wheelchair Mobility    Modified Rankin (Stroke Patients Only)       Balance                                            Cognition Arousal/Alertness: Awake/alert Behavior During Therapy: WFL for tasks assessed/performed Overall Cognitive Status:  Within Functional Limits for tasks assessed                                 General Comments: AxO x 3 pt familiar from prior admit.  Required MAX encouragement to participate.  "I don't want to".  "I feel so weak".  "I'm afraid I will fall".        Exercises      General Comments        Pertinent Vitals/Pain Pain Assessment Pain Assessment: Faces Faces Pain Scale: Hurts even more Pain Location: general/back/B LE Pain Descriptors / Indicators: Discomfort, Grimacing, Guarding Pain Intervention(s): Monitored during session, Repositioned    Home Living                          Prior Function            PT Goals (current goals can now be found in the care plan section) Progress towards PT goals: Progressing toward goals    Frequency    Min 2X/week      PT Plan Current plan remains appropriate    Co-evaluation              AM-PAC PT "6 Clicks" Mobility   Outcome Measure  Help needed turning from your back to your side while in a flat bed without using bedrails?: A Lot Help needed moving from lying on your back to sitting on the side of a flat bed without using bedrails?: A Lot Help needed moving to and from a bed to a chair (including a wheelchair)?: A Lot Help needed standing up from a chair using your arms (e.g., wheelchair or bedside chair)?: A Lot Help needed to walk in hospital room?: Total Help needed climbing 3-5 steps with a railing? : Total 6 Click Score: 10    End of Session Equipment Utilized During Treatment: Gait belt;Oxygen Activity Tolerance: Patient limited by fatigue Patient left: in bed;with call bell/phone within reach;with bed alarm set Nurse Communication: Mobility status PT Visit Diagnosis: Muscle weakness (generalized) (M62.81);Difficulty in walking, not elsewhere classified (R26.2);Pain Pain - Right/Left: Left Pain - part of body: Hip     Time: 1400-1430 PT Time Calculation (min) (ACUTE ONLY): 30  min  Charges:  $Therapeutic Activity: 23-37 mins                     {Szymon Foiles  PTA Acute  Rehabilitation Services Office M-F          [phone]

## 2022-05-15 NOTE — TOC Progression Note (Addendum)
Transition of Care [facility]) - Progression Note    Patient Details  Name: [name] MRN: [id number] Date of Birth: 12/11/1945  Transition of Care [facility]) CM/SW [place], LCSW Phone Number: 05/15/2022, 10:25 AM  Clinical Narrative:    Csw spoke with pt and presented bed offers , pt reported she did not want to accept any of the facilities that offer her a bed. Pt reported she would like "[facility]". Pt stated "someone called me yesterday and told her [facility] offered her a bed."  CSW reported [facility] did not offer her a bed. Pt then stated "Yall all are damn liars " then disconnected the call.   CSW reached out to [facility] admission to review pt awaiting call back. TOC to follow.   10:30am  CSW received call from pt's son, regarding pt.CSW provided update to pt's son. He reported he will speak with his mother and call this CSW back in 30 mins. TOC to follow     11:00am Pt's son called back stating he spoke with pt and they have chosen [facility]. CSW spoke to [place] with [facility] they are able to take pt however she is in her copay days. CSW to start insurance auth. TOC to follow   Expected Discharge Plan: [place] Barriers to Discharge: Continued Medical Work up  Expected Discharge Plan and Services       Living arrangements for the past 2 months: Apartment Expected Discharge Date: 05/15/22                                     Social Determinants of Health (SDOH) Interventions SDOH Screenings   Food Insecurity: No Food Insecurity (05/11/2022)  Housing: Low Risk  (05/11/2022)  Transportation Needs: No Transportation Needs (05/11/2022)  Utilities: Not At Risk (05/11/2022)  Depression (PHQ2-9): Low Risk  (11/19/2021)  Financial Resource Strain: High Risk (12/18/2016)  Physical Activity: Inactive (12/18/2016)  Social Connections: Moderately Integrated (02/05/2021)  Stress: Stress Concern Present (12/18/2016)   Tobacco Use: Medium Risk (05/12/2022)    Readmission Risk Interventions    03/21/2022    2:22 PM 01/10/2021    9:47 AM  Readmission Risk Prevention Plan  Transportation Screening Complete Complete  PCP or Specialist Appt within 3-5 Days  Complete  HRI or [place]  Complete  Social Work Consult for [employer] Planning/Counseling  Complete  Palliative Care Screening  Not Applicable  Medication Review Press photographer) Complete Complete  PCP or Specialist appointment within 3-5 days of discharge Complete   HRI or [place] Complete   SW Recovery Care/Counseling Consult Complete   Palliative Care Screening Complete   Skilled Nursing Facility Complete

## 2022-05-15 NOTE — Care Management Important Message (Signed)
Important Message  Patient Details IM Letter given to the Patient. Name: [name] MRN: [id number] Date of Birth: [birth year]   Medicare Important Message Given:  Yes     Kerin Salen 05/15/2022, 11:46 AM

## 2022-05-16 DIAGNOSIS — E119 Type 2 diabetes mellitus without complications: Secondary | ICD-10-CM | POA: Diagnosis not present

## 2022-05-16 DIAGNOSIS — Z7401 Bed confinement status: Secondary | ICD-10-CM | POA: Diagnosis not present

## 2022-05-16 DIAGNOSIS — N390 Urinary tract infection, site not specified: Secondary | ICD-10-CM | POA: Diagnosis not present

## 2022-05-16 DIAGNOSIS — I5042 Chronic combined systolic (congestive) and diastolic (congestive) heart failure: Secondary | ICD-10-CM | POA: Diagnosis not present

## 2022-05-16 DIAGNOSIS — K769 Liver disease, unspecified: Secondary | ICD-10-CM | POA: Diagnosis not present

## 2022-05-16 DIAGNOSIS — Z87891 Personal history of nicotine dependence: Secondary | ICD-10-CM | POA: Diagnosis not present

## 2022-05-16 DIAGNOSIS — I5033 Acute on chronic diastolic (congestive) heart failure: Secondary | ICD-10-CM | POA: Diagnosis not present

## 2022-05-16 DIAGNOSIS — R1312 Dysphagia, oropharyngeal phase: Secondary | ICD-10-CM | POA: Diagnosis not present

## 2022-05-16 DIAGNOSIS — J9611 Chronic respiratory failure with hypoxia: Secondary | ICD-10-CM | POA: Diagnosis not present

## 2022-05-16 DIAGNOSIS — G8929 Other chronic pain: Secondary | ICD-10-CM | POA: Diagnosis not present

## 2022-05-16 DIAGNOSIS — Z743 Need for continuous supervision: Secondary | ICD-10-CM | POA: Diagnosis not present

## 2022-05-16 DIAGNOSIS — N179 Acute kidney failure, unspecified: Secondary | ICD-10-CM | POA: Diagnosis not present

## 2022-05-16 DIAGNOSIS — M6281 Muscle weakness (generalized): Secondary | ICD-10-CM | POA: Diagnosis not present

## 2022-05-16 DIAGNOSIS — C259 Malignant neoplasm of pancreas, unspecified: Secondary | ICD-10-CM | POA: Diagnosis not present

## 2022-05-16 DIAGNOSIS — R2689 Other abnormalities of gait and mobility: Secondary | ICD-10-CM | POA: Diagnosis not present

## 2022-05-16 DIAGNOSIS — N3001 Acute cystitis with hematuria: Secondary | ICD-10-CM | POA: Diagnosis not present

## 2022-05-16 DIAGNOSIS — R1084 Generalized abdominal pain: Secondary | ICD-10-CM | POA: Diagnosis not present

## 2022-05-16 DIAGNOSIS — R160 Hepatomegaly, not elsewhere classified: Secondary | ICD-10-CM | POA: Diagnosis not present

## 2022-05-16 DIAGNOSIS — K869 Disease of pancreas, unspecified: Secondary | ICD-10-CM | POA: Diagnosis not present

## 2022-05-16 DIAGNOSIS — E440 Moderate protein-calorie malnutrition: Secondary | ICD-10-CM | POA: Diagnosis not present

## 2022-05-16 DIAGNOSIS — C253 Malignant neoplasm of pancreatic duct: Secondary | ICD-10-CM | POA: Diagnosis not present

## 2022-05-16 DIAGNOSIS — G9340 Encephalopathy, unspecified: Secondary | ICD-10-CM | POA: Diagnosis not present

## 2022-05-16 DIAGNOSIS — K8689 Other specified diseases of pancreas: Secondary | ICD-10-CM | POA: Diagnosis not present

## 2022-05-16 LAB — GLUCOSE, CAPILLARY
Glucose-Capillary: 122 mg/dL — ABNORMAL HIGH (ref 70–99)
Glucose-Capillary: 133 mg/dL — ABNORMAL HIGH (ref 70–99)

## 2022-05-16 MED ORDER — TICAGRELOR 90 MG PO TABS
90.0000 mg | ORAL_TABLET | Freq: Two times a day (BID) | ORAL | Status: DC
  Administered 2022-05-16: 90 mg via ORAL
  Filled 2022-05-16: qty 1

## 2022-05-16 NOTE — TOC Progression Note (Signed)
Transition of Care [facility]) - Progression Note    Patient Details  Name: [name] MRN: [id number] Date of Birth: 1945-05-17  Transition of Care [facility]) CM/SW Contact  Larrie Kass, LCSW Phone Number: 05/16/2022, 9:44 AM  Clinical Narrative:     Insurance auth still pending.  Expected Discharge Plan: Skilled Nursing Facility Barriers to Discharge: Continued Medical Work up  Expected Discharge Plan and Services       Living arrangements for the past 2 months: Apartment Expected Discharge Date: 05/15/22                                     Social Determinants of Health (SDOH) Interventions SDOH Screenings   Food Insecurity: No Food Insecurity (05/11/2022)  Housing: Low Risk  (05/11/2022)  Transportation Needs: No Transportation Needs (05/11/2022)  Utilities: Not At Risk (05/11/2022)  Depression (PHQ2-9): Low Risk  (11/19/2021)  Financial Resource Strain: High Risk (12/18/2016)  Physical Activity: Inactive (12/18/2016)  Social Connections: Moderately Integrated (02/05/2021)  Stress: Stress Concern Present (12/18/2016)  Tobacco Use: Medium Risk (05/12/2022)    Readmission Risk Interventions    03/21/2022    2:22 PM 01/10/2021    9:47 AM  Readmission Risk Prevention Plan  Transportation Screening Complete Complete  PCP or Specialist Appt within 3-5 Days  Complete  HRI or Home Care Consult  Complete  Social Work Consult for Recovery Care Planning/Counseling  Complete  Palliative Care Screening  Not Applicable  Medication Review Oceanographer) Complete Complete  PCP or Specialist appointment within 3-5 days of discharge Complete   HRI or Home Care Consult Complete   SW Recovery Care/Counseling Consult Complete   Palliative Care Screening Complete   Skilled Nursing Facility Complete

## 2022-05-16 NOTE — TOC Transition Note (Signed)
Transition of Care [facility]) - CM/SW Discharge Note   Patient Details  Name: DAJHA GELARDI MRN: [id number] Date of Birth: 1945-07-29  [facility]) CM/SW Contact:  Larrie Kass, LCSW Phone Number: 05/16/2022, 11:54 AM   Clinical Narrative:    Pt's insurance [employer] was approved, pt can d/c to [facility] today. Rm 311 call report [phone]. Toc sign off.      Barriers to Discharge: Continued Medical Work up   Patient Goals and CMS Choice      Discharge Placement                         Discharge Plan and Services Additional resources added to the After Visit Summary for                                       Social Determinants of Health (SDOH) Interventions SDOH Screenings   Food Insecurity: No Food Insecurity (05/11/2022)  Housing: Low Risk  (05/11/2022)  Transportation Needs: No Transportation Needs (05/11/2022)  Utilities: Not At Risk (05/11/2022)  Depression (PHQ2-9): Low Risk  (11/19/2021)  Financial Resource Strain: High Risk (12/18/2016)  Physical Activity: Inactive (12/18/2016)  Social Connections: Moderately Integrated (02/05/2021)  Stress: Stress Concern Present (12/18/2016)  Tobacco Use: Medium Risk (05/12/2022)     Readmission Risk Interventions    03/21/2022    2:22 PM 01/10/2021    9:47 AM  Readmission Risk Prevention Plan  Transportation Screening Complete Complete  PCP or Specialist Appt within 3-5 Days  Complete  HRI or Home Care Consult  Complete  Social Work Consult for Recovery Care Planning/Counseling  Complete  Palliative Care Screening  Not Applicable  Medication Review Oceanographer) Complete Complete  PCP or Specialist appointment within 3-5 days of discharge Complete   HRI or Home Care Consult Complete   SW Recovery Care/Counseling Consult Complete   Palliative Care Screening Complete   Skilled Nursing Facility Complete

## 2022-05-16 NOTE — Plan of Care (Signed)
  Problem: Education: Goal: Knowledge of disease or condition will improve Outcome: Adequate for Discharge Goal: Knowledge of secondary prevention will improve (MUST DOCUMENT ALL) Outcome: Adequate for Discharge Goal: Knowledge of patient specific risk factors will improve Loraine Leriche N/A or DELETE if not current risk factor) Outcome: Adequate for Discharge   Problem: Ischemic Stroke/TIA Tissue Perfusion: Goal: Complications of ischemic stroke/TIA will be minimized Outcome: Adequate for Discharge   Problem: Coping: Goal: Will verbalize positive feelings about self Outcome: Adequate for Discharge Goal: Will identify appropriate support needs Outcome: Adequate for Discharge   Problem: Health Behavior/Discharge Planning: Goal: Ability to manage health-related needs will improve Outcome: Adequate for Discharge Goal: Goals will be collaboratively established with patient/family Outcome: Adequate for Discharge   Problem: Self-Care: Goal: Ability to participate in self-care as condition permits will improve Outcome: Adequate for Discharge Goal: Verbalization of feelings and concerns over difficulty with self-care will improve Outcome: Adequate for Discharge Goal: Ability to communicate needs accurately will improve Outcome: Adequate for Discharge   Problem: Nutrition: Goal: Risk of aspiration will decrease Outcome: Adequate for Discharge Goal: Dietary intake will improve Outcome: Adequate for Discharge   Problem: Health Behavior/Discharge Planning: Goal: Ability to manage health-related needs will improve Outcome: Adequate for Discharge   Problem: Clinical Measurements: Goal: Ability to maintain clinical measurements within normal limits will improve Outcome: Adequate for Discharge Goal: Will remain free from infection Outcome: Adequate for Discharge Goal: Diagnostic test results will improve Outcome: Adequate for Discharge Goal: Respiratory complications will improve Outcome:  Adequate for Discharge Goal: Cardiovascular complication will be avoided Outcome: Adequate for Discharge   Problem: Activity: Goal: Risk for activity intolerance will decrease Outcome: Adequate for Discharge   Problem: Nutrition: Goal: Adequate nutrition will be maintained Outcome: Adequate for Discharge   Problem: Coping: Goal: Level of anxiety will decrease Outcome: Adequate for Discharge   Problem: Elimination: Goal: Will not experience complications related to bowel motility Outcome: Adequate for Discharge Goal: Will not experience complications related to urinary retention Outcome: Adequate for Discharge   Problem: Safety: Goal: Ability to remain free from injury will improve Outcome: Adequate for Discharge   Problem: Skin Integrity: Goal: Risk for impaired skin integrity will decrease Outcome: Adequate for Discharge   Problem: Education: Goal: Ability to describe self-care measures that may prevent or decrease complications (Diabetes Survival Skills Education) will improve Outcome: Adequate for Discharge Goal: Individualized Educational Video(s) Outcome: Adequate for Discharge   Problem: Coping: Goal: Ability to adjust to condition or change in health will improve Outcome: Adequate for Discharge   Problem: Fluid Volume: Goal: Ability to maintain a balanced intake and output will improve Outcome: Adequate for Discharge   Problem: Health Behavior/Discharge Planning: Goal: Ability to identify and utilize available resources and services will improve Outcome: Adequate for Discharge Goal: Ability to manage health-related needs will improve Outcome: Adequate for Discharge   Problem: Metabolic: Goal: Ability to maintain appropriate glucose levels will improve Outcome: Adequate for Discharge   Problem: Nutritional: Goal: Maintenance of adequate nutrition will improve Outcome: Adequate for Discharge Goal: Progress toward achieving an optimal weight will  improve Outcome: Adequate for Discharge   Problem: Skin Integrity: Goal: Risk for impaired skin integrity will decrease Outcome: Adequate for Discharge   Problem: Tissue Perfusion: Goal: Adequacy of tissue perfusion will improve Outcome: Adequate for Discharge

## 2022-05-16 NOTE — Progress Notes (Signed)
Report called to [facility] nurse [place]. Attached the AVS to discharged package. [employer]ron [employer], RN

## 2022-05-16 NOTE — Discharge Summary (Signed)
Physician Discharge Summary  [name] ZOX:[id number] DOB: [birth year] DOA: 05/11/2022  PCP: Darrow Bussing, MD  Admit date: 05/11/2022 Discharge date: 05/16/2022  Discharge disposition: SNF   Recommendations for Outpatient Follow-Up:   Brilinta 90 mg - stop date 4/15 Will need close follow up with medical oncology for work up of pancreatic lesion after finishes brilenta BMP 1 week Continue 3L O2  Discharge Diagnosis:   Principal Problem:   Acute renal failure superimposed on stage 4 chronic kidney disease Active Problems:   Pancreatic lesion   UTI (urinary tract infection)   Chronic hypoxemic respiratory failure   Asthma without acute exacerbation   Hypokalemia   CHB (complete heart block)   Paroxysmal atrial fibrillation   Thrombocytopenia   Diabetes mellitus without complication   Sleep apnea   Mixed hypercholesterolemia and hypertriglyceridemia   Hypertension   Chronic combined systolic and diastolic CHF (congestive heart failure)   Coronary artery disease   Hepatic lesion   Malnutrition of moderate degree    Discharge Condition: Improved.  Diet recommendation: Low sodium, heart healthy.  Carbohydrate-modified  Wound care: routine/ local  Code status: Full.   History of Present Illness:   [name] is a 77 y.o. female with medical history significant for chronic combined systolic and diastolic CHF (EF 81-19 %), CHB s/p CRT-D, CKD stage IV, CAD s/p CABG, PAF not on anticoagulation, chronic respiratory failure with hypoxia on 3 L [place], asthma, T2DM, HTN, HLD, chronic thrombocytopenia, OSA intolerant to CPAP who presented to the ED for evaluation of abdominal pain and dysuria.   Patient recently admitted 04/21/2022-04/25/2022 for encephalopathy and expressive aphasia.  CT head was negative x 2 for any acute stroke.  MRI unable to be performed due to pacemaker.  CTA not performed due to contrast allergy.  Transcranial and carotid Dopplers were  unremarkable.  Her Plavix was discontinued and she was switched to DAPT with aspirin and Brilinta for 4 weeks to be followed by aspirin alone.  PT/OT recommended SNF however insurance denied it and patient returned back to long-term care.   Patient states that she has been having 1 day of epigastric abdominal pain without nausea or vomiting.  She has had dysuria as well.  She denies any diarrhea, fevers, chills, diaphoresis.  She has had some exertional dyspnea.  Denies chest pain.  She says she hit her left foot resulting in a laceration to her second left toe which has been bleeding.   Patient reportedly checked herself out of her long-term care facility and has been at home mostly immobile on the couch unable to care for herself.  She admits that it was a mistake to have left the facility.   Hospital Course by Problem:   Acute renal failure superimposed on CKD stage IV: Creatinine on admission 3.61 with baseline 2.0-2.2.  Appears largely euvolemic on admission.  Does not appear to be on any offending medications. CT abdomen/pelvis with no hydronephrosis.  Renal ultrasound with bilateral increased renal cortical echotexture consistent with medical renal disease, bilateral simple appearing renal cyst. -- Cr trending down to normal   1.4 cm pancreatic head lesion Innumerable new hepatic hypodensities: Findings noted on CT imaging.  Pancreatic head lesion concerning for primary pancreatic neoplasm and hepatic lesions suspicious for metastatic disease.  Discussed findings with patient.  aFP less than 1.8 -- CA 19-9 elevated-- patient updated -- Will need outpatient follow-up with [facility], medical oncology   Proteus Mirabilis urinary tract infection: Urinalysis  with large leukocytes, negative nitrite, many bacteria, greater than 50 WBCs.  -- Urine culture: + >100K Proteus -- Ceftriaxone 1 g IV changed to PO abx to finish course on 4/4   Chronic combined systolic and diastolic CHF CHB s/p  CRT-D: Appears euvolemic on admission.  Does not appear to be on diuretics or usual GDMT as an outpatient.   Paroxysmal atrial fibrillation: Low burden A-fib mostly in paced rhythm chronically.  Not on anticoagulation.  Recently placed on aspirin/Brilinta. -- Continue aspirin and Brilinta until stop date   CAD s/p CABG: Stable, denies chest pain.   -- Continue Imdur (increase dose) and aspirin.     Hypokalemia: -replete   Recent admit for expressive aphasia/TIA?: She was started on DAPT with aspirin/Brilinta x 4 weeks to be followed by aspirin alone for presumed CVA.  Workup was limited.  Unable to obtain MRI due to pacemaker and could not obtain CTA due to contrast allergy. -- Continue aspirin/Brilinta and rosuvastatin   Thrombocytopenia: Stable, continue to monitor.   Renal cyst: Seen again on CT imaging.  Renal ultrasound with bilateral simple appearing renal cysts, no follow-up required.   Chronic respiratory failure with hypoxia Asthma: Stable on home 3 L O2 via [place].  No wheezing.  Continue Dulera and albuterol as needed.   Laceration left second toe: Right Buttock stage I pressure injury, POA Pressure Injury 05/11/22 Buttocks Right;Left Stage 1 -  Intact skin with non-blanchable redness of a localized area usually over a bony prominence. (Active)  05/11/22 2326  Location: Buttocks  Location Orientation: Right;Left  Staging: Stage 1 -  Intact skin with non-blanchable redness of a localized area usually over a bony prominence.  Wound Description (Comments):   Present on Admission: Yes  X-ray negative for evidence of fracture or dislocation.  Continue wound care.   Type 2 diabetes: Well-controlled with last A1c 5.8% on 04/22/2022.     Hypertension: Continue Imdur at higher dose   Hyperlipidemia: Continue rosuvastatin 10 mg p.o. nightly.   Obstructive sleep apnea: Intolerant of CPAP.  Continue supplemental O2 via [place] at 3L [place].    Weakness/debility/deconditioning: Patient previously a resident at a long-term care facility; now residing at home alone. --PT/OT evaluation recommend SNF placement -- TOC consulted for placement        Medical Consultants:      Discharge Exam:   Vitals:   05/16/22 0421 05/16/22 0809  BP: 127/77   Pulse: 91   Resp: 20   Temp: 97.8 F (36.6 C)   SpO2: 100% 99%   Vitals:   05/15/22 0452 05/15/22 2035 05/16/22 0421 05/16/22 0809  BP: (!) 150/88  127/77   Pulse: (!) 59  91   Resp: 20  20   Temp: 97.9 F (36.6 C)  97.8 F (36.6 C)   TempSrc: Oral  Oral   SpO2: 100% 98% 100% 99%  Weight: 81 kg  82.2 kg   Height:        General exam: Appears calm and comfortable.    The results of significant diagnostics from this hospitalization (including imaging, microbiology, ancillary and laboratory) are listed below for reference.     Procedures and Diagnostic Studies:   DG HIP UNILAT W OR W/O PELVIS 2-3 VIEWS LEFT  Result Date: 05/12/2022 CLINICAL DATA:  Postoperative left hip surgery 02/13/2022 EXAM: DG HIP (WITH OR WITHOUT PELVIS) 2-3V LEFT COMPARISON:  CT abdomen pelvis 05/11/2022, left hip radiographs 02/17/2022 FINDINGS: Redemonstration of total left hip arthroplasty with moderately long femoral  stem. Lateral hook plate and cerclage wiring around the proximal to mid aspects of the femoral stem, similar to prior. No perihardware lucency is seen to indicate hardware failure or loosening. Mild superolateral right femoroacetabular joint space narrowing and peripheral osteophytosis. IMPRESSION: 1. Status post total left hip arthroplasty without evidence of hardware failure. 2. Mild right femoroacetabular osteoarthritis. Electronically Signed   By: Neita Garnet M.D.   On: 05/12/2022 14:24   US RENAL  Result Date: 05/11/2022 CLINICAL DATA:  Acute on chronic renal insufficiency EXAM: RENAL / URINARY TRACT ULTRASOUND COMPLETE COMPARISON:  05/11/2022 FINDINGS: Right Kidney: Renal  measurements: 10.0 x 4.6 x 4.7 cm = volume: 111.4 mL. Increased renal cortical echotexture consistent with medical renal disease. No hydronephrosis or nephrolithiasis. There are 2 simple renal cysts identified, largest in the lower pole measuring 2.7 x 2.0 x 2.3 cm, and a smaller in the upper pole measuring 2.2 x 1.4 x 1.7 cm. Left Kidney: Renal measurements: 8.8 x 4.2 by 4.9 cm = volume: 93.9 mL. Increased renal cortical echotexture. No hydronephrosis or nephrolithiasis. Exophytic simple cyst off the mid left kidney measures 1.7 by 1.3 by 1.5 cm. There is also a 1.3 cm simple cortical cyst lower pole left kidney. Bladder: Appears normal for degree of bladder distention. Other: None. IMPRESSION: 1. Bilateral increased renal cortical echotexture consistent with medical renal disease. 2. Bilateral simple appearing renal cysts. No specific imaging follow-up is required. Electronically Signed   By: Sharlet Salina M.D.   On: 05/11/2022 23:20   CT ABDOMEN PELVIS WO CONTRAST  Result Date: 05/11/2022 CLINICAL DATA:  Epigastric pain EXAM: CT ABDOMEN AND PELVIS WITHOUT CONTRAST TECHNIQUE: Multidetector CT imaging of the abdomen and pelvis was performed following the standard protocol without IV contrast. RADIATION DOSE REDUCTION: This exam was performed according to the departmental dose-optimization program which includes automated exposure control, adjustment of the mA and/or kV according to patient size and/or use of iterative reconstruction technique. COMPARISON:  CT abdomen and pelvis dated 03/17/2022 FINDINGS: Lower chest: No focal consolidation or pulmonary nodule in the lung bases. No pleural effusion or pneumothorax demonstrated. Partially imaged heart size is normal. Partially imaged median sternotomy wires are nondisplaced. Hepatobiliary: Innumerable new hepatic hypodensities measuring up to 3.0 cm (2:16). No intra or extrahepatic biliary ductal dilation. Cholecystectomy. Pancreas: Ill-defined 1.4 cm  hypoattenuating focus within the pancreatic head (2:29), not well seen on prior examination. No main pancreatic ductal dilation. Spleen: Normal in size without focal abnormality. Adrenals/Urinary Tract: No adrenal nodules. Bilateral renal cortical thinning. No hydronephrosis or calculi. 2.2 cm right lower pole exophytic lesion does not demonstrate simple fluid attenuation. Additional bilateral simple-appearing renal cysts. No specific follow-up imaging recommended. No focal bladder wall thickening. Stomach/Bowel: Normal appearance of the stomach. No evidence of bowel wall thickening, distention, or inflammatory changes. Colonic diverticulosis without acute diverticulitis. Appendix is not discretely seen. Vascular/Lymphatic: Aortic atherosclerosis. Retroaortic left renal vein. No enlarged abdominal or pelvic lymph nodes. Reproductive: No adnexal masses. Other: Mild presacral edema.  No free air. Musculoskeletal: Lytic focus along the medial left acetabulum (2:66) measuring 2.3 cm at the site of prior compression screw. Status post left hip arthroplasty. Postsurgical changes from partially imaged thoracic spinal fixation. Unchanged fracture of T9. Multilevel degenerative changes of the partially imaged thoracic and lumbar spine. Unchanged grade 1 anterolisthesis at L5-S1. IMPRESSION: 1. Innumerable new hepatic hypodensities measuring up to 3.0 cm suspicious for metastatic disease. 2. Ill-defined 1.4 cm hypoattenuating focus within the pancreatic head, not well seen on prior examination,  suspicious for primary pancreatic neoplasm. 3. Lytic focus along the medial left acetabulum measuring up to 2.3 cm at the site of prior compression screw may be postsurgical. 4. Indeterminate 2.2 cm right renal lower pole exophytic lesion. Given the patient's history of contraindication to MRI and contrast-enhanced CT, ultrasound of the kidneys can be considered for further characterization. 5.  Aortic Atherosclerosis (ICD10-I70.0).  These results were called by telephone at the time of interpretation on 05/11/2022 at 7:37 pm to provider [facility] , who verbally acknowledged these results. Electronically Signed   By: Agustin Cree M.D.   On: 05/11/2022 19:41   DG Chest Portable 1 View  Result Date: 05/11/2022 CLINICAL DATA:  Shortness of breath EXAM: PORTABLE CHEST 1 VIEW COMPARISON:  04/21/2022 FINDINGS: Cardiomegaly. Prior CABG. Right pacer remains in place, unchanged. No confluent airspace opacities, effusions or edema. Aortic atherosclerosis. No acute bony abnormality. IMPRESSION: Cardiomegaly.  No active disease. Electronically Signed   By: Charlett Nose M.D.   On: 05/11/2022 17:50   DG Foot Complete Left  Result Date: 05/11/2022 CLINICAL DATA:  Wounds to the second and third digits EXAM: LEFT FOOT - COMPLETE 3 VIEW COMPARISON:  None Available. FINDINGS: There is no definite evidence of fracture or dislocation. Old fracture of the partially imaged distal fibula. Degenerative changes of the foot. Soft tissues are unremarkable. IMPRESSION: 1. No definite evidence of fracture or dislocation. 2. Old fracture of the partially imaged distal fibula. 3. No soft tissue abnormality to correspond to reported wounds. Electronically Signed   By: Agustin Cree M.D.   On: 05/11/2022 17:49     Labs:   Basic Metabolic Panel: Recent Labs  Lab 05/11/22 1601 05/12/22 0423 05/13/22 0436 05/14/22 0437 05/15/22 0414  NA 136 136 137 136 136  K 2.1* 3.1* 4.0 4.0 3.9  CL 102 105 109 110 111  CO2 23 23 21* 20* 20*  GLUCOSE 128* 114* 121* 126* 123*  BUN 60* 57* 55* 48* 38*  CREATININE 3.61* 3.26* 3.05* 2.80* 2.48*  CALCIUM 8.8* 8.4* 8.2* 8.1* 8.2*  MG 2.2  --  2.3  --   --    GFR Estimated Creatinine Clearance: 19 mL/min (A) (by C-G formula based on SCr of 2.48 mg/dL (H)). Liver Function Tests: Recent Labs  Lab 05/11/22 2258 05/12/22 0423  AST 41 26  ALT 24 22  ALKPHOS 455* 395*  BILITOT 1.0 0.4  PROT 6.3* 5.0*  ALBUMIN 2.4* 2.0*    Recent Labs  Lab 05/11/22 2258  LIPASE 73*   No results for input(s): "AMMONIA" in the last 168 hours. Coagulation profile No results for input(s): "INR", "PROTIME" in the last 168 hours.  CBC: Recent Labs  Lab 05/11/22 1601 05/12/22 0423 05/15/22 0414  WBC 11.0* 9.4 7.7  NEUTROABS 9.0*  --   --   HGB 11.5* 10.1* 9.4*  HCT 36.7 32.2* 31.6*  MCV 89.5 89.4 92.9  PLT 93* 81* 43*   Cardiac Enzymes: No results for input(s): "CKTOTAL", "CKMB", "CKMBINDEX", "TROPONINI" in the last 168 hours. BNP: Invalid input(s): "POCBNP" CBG: Recent Labs  Lab 05/15/22 0741 05/15/22 1149 05/15/22 1736 05/15/22 2106 05/16/22 0741  GLUCAP 129* 148* 195* 89 122*   D-Dimer No results for input(s): "DDIMER" in the last 72 hours. Hgb A1c No results for input(s): "HGBA1C" in the last 72 hours. Lipid Profile No results for input(s): "CHOL", "HDL", "LDLCALC", "TRIG", "CHOLHDL", "LDLDIRECT" in the last 72 hours. Thyroid function studies No results for input(s): "TSH", "T4TOTAL", "T3FREE", "THYROIDAB" in  the last 72 hours.  Invalid input(s): "FREET3" Anemia work up No results for input(s): "VITAMINB12", "FOLATE", "FERRITIN", "TIBC", "IRON", "RETICCTPCT" in the last 72 hours. Microbiology Recent Results (from the past 240 hour(s))  Urine Culture (for pregnant, neutropenic or urologic patients or patients with an indwelling urinary catheter)     Status: Abnormal   Collection Time: 05/11/22  7:07 PM   Specimen: Urine, Clean Catch  Result Value Ref Range Status   Specimen Description   Final    URINE, CLEAN CATCH Performed at [facility], [address] [place], [place] [address]    Special Requests   Final    NONE Performed at [facility], [address] [place], [place] [address]    Culture >=100,000 COLONIES/mL PROTEUS MIRABILIS (A)  Final   Report Status 05/14/2022 FINAL  Final   Organism ID, Bacteria PROTEUS MIRABILIS (A)  Final       Susceptibility   Proteus mirabilis - MIC*    AMPICILLIN <=2 SENSITIVE Sensitive     CEFAZOLIN <=4 SENSITIVE Sensitive     CEFEPIME <=0.12 SENSITIVE Sensitive     CEFTRIAXONE <=0.25 SENSITIVE Sensitive     CIPROFLOXACIN >=4 RESISTANT Resistant     GENTAMICIN <=1 SENSITIVE Sensitive     IMIPENEM 2 SENSITIVE Sensitive     NITROFURANTOIN 128 RESISTANT Resistant     TRIMETH/SULFA <=20 SENSITIVE Sensitive     AMPICILLIN/SULBACTAM <=2 SENSITIVE Sensitive     PIP/TAZO <=4 SENSITIVE Sensitive     * >=100,000 COLONIES/mL PROTEUS MIRABILIS     Discharge Instructions:   Discharge Instructions     Diet - low sodium heart healthy   Complete by: As directed    Diet Carb Modified   Complete by: As directed    Discharge wound care:   Complete by: As directed    Routine wound care   Increase activity slowly   Complete by: As directed       Allergies as of 05/16/2022       Reactions   Ivp Dye [iodinated Contrast Media] Shortness Of Breath   No reaction to PO contrast with non-ionic dye.06-25-2014/rsm   Shellfish Allergy Anaphylaxis   Sulfa Antibiotics Shortness Of Breath   Iodine Hives   Benadryl [diphenhydramine] Other (See Comments)   "THIS DRIVES ME CRAZY AND MAKES ME FEEL LIKE I AM DYING"   Lipitor [atorvastatin] Other (See Comments)   Myalgias    Mitigare [colchicine] Nausea And Vomiting   Uloric [febuxostat] Other (See Comments)   Unknown reaction Documented on MAR   Vibra-tab [doxycycline] Other (See Comments)   Unknown reaction Documented on MAR   Keflex [cephalexin] Itching, Rash   Zithromax [azithromycin] Rash        Medication List     STOP taking these medications    sucralfate 1 GM/10ML suspension Commonly known as: CARAFATE       TAKE these medications    acetaminophen 325 MG tablet Commonly known as: TYLENOL Take 2 tablets (650 mg total) by mouth every 6 (six) hours as needed for mild pain (or Fever >/= 101).   albuterol 108 (90 Base) MCG/ACT  inhaler Commonly known as: VENTOLIN HFA Inhale 2 puffs into the lungs every 6 (six) hours as needed for wheezing or shortness of breath.   albuterol (2.5 MG/3ML) 0.083% nebulizer solution Commonly known as: PROVENTIL Take 3 mLs (2.5 mg total) by nebulization every 6 (six) hours as needed for wheezing or shortness of breath.   allopurinol 100  MG tablet Commonly known as: ZYLOPRIM Take 1 tablet (100 mg total) by mouth daily. Please contact the office to schedule appointment for additional refills. 1st Attempt. What changed: additional instructions   aspirin EC 81 MG tablet Take 1 tablet (81 mg total) by mouth daily. Swallow whole.   barrier cream Crea Commonly known as: non-specified Apply 1 Application topically See admin instructions. Apply topically to buttocks every shift and as needed for each incontinence episode.   budesonide-formoterol 160-4.5 MCG/ACT inhaler Commonly known as: SYMBICORT Inhale 1 puff into the lungs 2 (two) times daily.   camphor-menthol lotion Commonly known as: SARNA Apply topically as needed for itching.   carboxymethylcellulose 0.5 % Soln Commonly known as: REFRESH PLUS Place 1 drop into both eyes 3 (three) times daily as needed (dry eyes).   cyanocobalamin 1000 MCG tablet Commonly known as: VITAMIN B12 Take 1 tablet (1,000 mcg total) by mouth daily.   cycloSPORINE 0.05 % ophthalmic emulsion Commonly known as: RESTASIS Place 1 drop into both eyes 2 (two) times daily.   dicyclomine 10 MG capsule Commonly known as: BENTYL Take 1 capsule (10 mg total) by mouth every 6 (six) hours as needed for spasms.   ferrous sulfate 325 (65 FE) MG tablet Take 1 tablet (325 mg total) by mouth daily with breakfast.   fluticasone 50 MCG/ACT nasal spray Commonly known as: FLONASE Place 1 spray into both nostrils daily as needed for allergies.   folic acid 1 MG tablet Commonly known as: FOLVITE Take 1 tablet (1 mg total) by mouth daily.   Gerhardt's butt  cream Crea Apply 1 Application topically 2 (two) times daily.   hydrOXYzine 25 MG tablet Commonly known as: ATARAX Take 25 mg by mouth 2 (two) times daily as needed for itching.   isosorbide mononitrate 60 MG 24 hr tablet Commonly known as: IMDUR Take 0.5 tablets (30 mg total) by mouth daily. What changed: medication strength   lidocaine 4 % Place 1 patch onto the skin daily as needed (pain).   methocarbamol 500 MG tablet Commonly known as: ROBAXIN Take 500 mg by mouth every 6 (six) hours as needed for muscle spasms.   Mylanta Coat & Cool 1200-270-80 MG/10ML Susp Generic drug: Cal Carb-Mag Hydrox-Simeth Take 30 mLs by mouth 3 (three) times daily as needed (gas pains).   naloxegol oxalate 12.5 MG Tabs tablet Commonly known as: MOVANTIK Take 1 tablet (12.5 mg total) by mouth daily.   nitroGLYCERIN 0.4 MG SL tablet Commonly known as: NITROSTAT Place 1 tablet (0.4 mg total) under the tongue every 5 (five) minutes as needed for chest pain. What changed: when to take this   ondansetron 4 MG tablet Commonly known as: ZOFRAN Take 4 mg by mouth every 8 (eight) hours as needed for nausea or vomiting.   oxyCODONE 5 MG immediate release tablet Commonly known as: Oxy IR/ROXICODONE Take 1 tablet (5 mg total) by mouth every 6 (six) hours as needed for severe pain.   OXYGEN Inhale 2-3 L/min into the lungs continuous.   oxymetazoline 0.05 % nasal spray Commonly known as: AFRIN Place 1 spray into both nostrils 2 (two) times daily as needed for congestion.   pantoprazole 40 MG tablet Commonly known as: PROTONIX Take 1 tablet (40 mg total) by mouth 2 (two) times daily.   polyethylene glycol powder 17 GM/SCOOP powder Commonly known as: GLYCOLAX/MIRALAX Take 17 g by mouth daily as needed (constipation).   PREPARATION H RAPID RELIEF RE Place 1 spray rectally every 6 (six) hours  as needed (hemorrhoids).   rosuvastatin 40 MG tablet Commonly known as: CRESTOR Take 1 tablet (40 mg  total) by mouth every evening.   simethicone 80 MG chewable tablet Commonly known as: MYLICON Chew 80 mg by mouth 4 (four) times daily as needed for flatulence.   ticagrelor 90 MG Tabs tablet Commonly known as: BRILINTA Take 1 tablet (90 mg total) by mouth 2 (two) times daily.               Discharge Care Instructions  (From admission, onward)           Start     Ordered   05/15/22 0000  Discharge wound care:       Comments: Routine wound care   05/15/22 0933              Time coordinating discharge: 45 min  Signed:  Joseph Art DO  [facility] 05/16/2022, 11:45 AM

## 2022-05-16 NOTE — Progress Notes (Signed)
Occupational Therapy Treatment Patient Details Name: [name] MRN: [id number] DOB: 05-28-1945 Today's Date: 05/16/2022   History of present illness Patient is a 77 year old female who presented on 3/31 with epigastric pain. Patient was found at home unable to take care of her self per MD note. Patient was admitted with acute renal failure superimposed on CKD IV, 1.4 cm pancreatic head lesion, innumerable new hepatic hypodensities, UTI. PMH: recent hospitalization 04/21/2022-04/25/2022 for encephalopathy and expressive aphasia, CAD s/p CABG, hypokalemia, COVID 19, a fib.   OT comments  Patient treatment limited this date due to nausea.  Patient found with BM and sheets wet, patient able to roll with up to Mod A and needed total assist for hygiene.  Patient declined OOB, but did participate with a few upper exercises.  OT continues to be indicated in the acute setting to address deficits, and Patient will benefit from continued inpatient follow up therapy, <3 hours/day.    Recommendations for follow up therapy are one component of a multi-disciplinary discharge planning process, led by the attending physician.  Recommendations may be updated based on patient status, additional functional criteria and insurance authorization.    Assistance Recommended at Discharge    Patient can return home with the following  Two people to help with walking and/or transfers;Two people to help with bathing/dressing/bathroom;Direct supervision/assist for financial management;Help with stairs or ramp for entrance;Assist for transportation;Direct supervision/assist for medications management;Assistance with cooking/housework   Equipment Recommendations       Recommendations for Other Services      Precautions / Restrictions Precautions Precautions: Fall Precaution Comments: Home oxygen Restrictions Weight Bearing Restrictions: Yes LLE Weight Bearing: Weight bearing as tolerated       Mobility Bed  Mobility Overal bed mobility: Needs Assistance Bed Mobility: Rolling Rolling: Mod assist                                                                                 ADL either performed or assessed with clinical judgement   ADL                               Toileting- Clothing Manipulation and Hygiene: Bed level;Total assistance              Extremity/Trunk Assessment Upper Extremity Assessment Upper Extremity Assessment: Generalized weakness   Lower Extremity Assessment Lower Extremity Assessment: Defer to PT evaluation   Cervical / Trunk Assessment Cervical / Trunk Assessment: Other exceptions Cervical / Trunk Exceptions: body habitus                      Cognition Arousal/Alertness: Awake/alert Behavior During Therapy: Flat affect Overall Cognitive Status: Within Functional Limits for tasks assessed                                                             Pertinent Vitals/ Pain       Pain  Assessment Pain Assessment: Faces Faces Pain Scale: Hurts little more Pain Location: general/back/B LE Pain Descriptors / Indicators: Discomfort, Grimacing, Guarding Pain Intervention(s): Monitored during session                                                          Frequency  Min 2X/week        Progress Toward Goals  OT Goals(current goals can now be found in the care plan section)  Progress towards OT goals: Not progressing toward goals - comment (nausea)  Acute Rehab OT Goals OT Goal Formulation: With patient Time For Goal Achievement: 05/26/22 Potential to Achieve Goals: Fair  Plan Discharge plan remains appropriate    Co-evaluation                 AM-PAC OT "6 Clicks" Daily Activity     Outcome Measure   Help from another person eating meals?: A Little Help from another person taking care of personal grooming?: A Little Help from  another person toileting, which includes using toliet, bedpan, or urinal?: Total Help from another person bathing (including washing, rinsing, drying)?: A Lot Help from another person to put on and taking off regular upper body clothing?: A Lot Help from another person to put on and taking off regular lower body clothing?: Total 6 Click Score: 12    End of Session Equipment Utilized During Treatment: Oxygen  OT Visit Diagnosis: Other abnormalities of gait and mobility (R26.89);Muscle weakness (generalized) (M62.81);Other symptoms and signs involving cognitive function   Activity Tolerance Other (comment) (Limited by nausea)   Patient Left in bed;with call bell/phone within reach;with nursing/sitter in room   Nurse Communication Mobility status        Time: 6578-4696 OT Time Calculation (min): 17 min  Charges: OT General Charges $OT Visit: 1 Visit OT Treatments $Self Care/Home Management : 8-22 mins  05/16/2022  RP, OTR/L  Acute Rehabilitation Services  Office:  [phone]   Suzanna Obey 05/16/2022, 11:31 AM

## 2022-05-17 DIAGNOSIS — C253 Malignant neoplasm of pancreatic duct: Secondary | ICD-10-CM | POA: Diagnosis not present

## 2022-05-17 DIAGNOSIS — G9340 Encephalopathy, unspecified: Secondary | ICD-10-CM | POA: Diagnosis not present

## 2022-05-17 DIAGNOSIS — N390 Urinary tract infection, site not specified: Secondary | ICD-10-CM | POA: Diagnosis not present

## 2022-05-20 DIAGNOSIS — C259 Malignant neoplasm of pancreas, unspecified: Secondary | ICD-10-CM | POA: Diagnosis not present

## 2022-05-21 DIAGNOSIS — M6281 Muscle weakness (generalized): Secondary | ICD-10-CM | POA: Diagnosis not present

## 2022-05-21 DIAGNOSIS — I5042 Chronic combined systolic (congestive) and diastolic (congestive) heart failure: Secondary | ICD-10-CM | POA: Diagnosis not present

## 2022-05-21 DIAGNOSIS — J9611 Chronic respiratory failure with hypoxia: Secondary | ICD-10-CM | POA: Diagnosis not present

## 2022-05-21 DIAGNOSIS — R2689 Other abnormalities of gait and mobility: Secondary | ICD-10-CM | POA: Diagnosis not present

## 2022-05-22 ENCOUNTER — Encounter (HOSPITAL_COMMUNITY): Payer: 59

## 2022-05-22 DIAGNOSIS — C259 Malignant neoplasm of pancreas, unspecified: Secondary | ICD-10-CM | POA: Diagnosis not present

## 2022-05-23 ENCOUNTER — Telehealth: Payer: Self-pay | Admitting: Physician Assistant

## 2022-05-23 ENCOUNTER — Other Ambulatory Visit: Payer: Self-pay | Admitting: *Deleted

## 2022-05-23 NOTE — Patient Outreach (Signed)
[name] resides in [facility]. Screening for potential [facility] care coordination needs as benefit of health plan and PCP.  Collaboration with [name] social worker. [name] recently returned home after being at [facility] for long term. However, she decided to return home.. She has readmitted to [facility] or rehab.  Unsure of transition plans post SNF at this time.   PCP office [facility] has [facility] care management.  [name], MSN, RN,BSN [facility] Post Acute Care Coordinator [phone] (Direct dial)

## 2022-05-23 NOTE — Telephone Encounter (Signed)
scheduled per 4/11 referral, [name] has been called and confirmed date and time. Pt is aware of location and to arrive early for check in

## 2022-05-26 ENCOUNTER — Inpatient Hospital Stay: Payer: 59 | Attending: Physician Assistant | Admitting: Physician Assistant

## 2022-05-26 ENCOUNTER — Other Ambulatory Visit: Payer: Self-pay

## 2022-05-26 ENCOUNTER — Inpatient Hospital Stay: Payer: 59

## 2022-05-26 ENCOUNTER — Ambulatory Visit: Payer: 59 | Admitting: Nurse Practitioner

## 2022-05-26 VITALS — BP 93/52 | HR 78 | Temp 97.6°F | Resp 15 | Wt 179.8 lb

## 2022-05-26 DIAGNOSIS — Z87891 Personal history of nicotine dependence: Secondary | ICD-10-CM | POA: Diagnosis not present

## 2022-05-26 DIAGNOSIS — R1084 Generalized abdominal pain: Secondary | ICD-10-CM | POA: Insufficient documentation

## 2022-05-26 DIAGNOSIS — R160 Hepatomegaly, not elsewhere classified: Secondary | ICD-10-CM | POA: Diagnosis not present

## 2022-05-26 DIAGNOSIS — Z7189 Other specified counseling: Secondary | ICD-10-CM

## 2022-05-26 DIAGNOSIS — K8689 Other specified diseases of pancreas: Secondary | ICD-10-CM | POA: Diagnosis not present

## 2022-05-26 MED ORDER — OXYCODONE HCL 5 MG PO TABS: 5.0000 mg | ORAL_TABLET | ORAL | 0 refills | Status: DC | PRN

## 2022-05-26 MED ORDER — OXYCODONE HCL 5 MG PO TABS
5.0000 mg | ORAL_TABLET | Freq: Once | ORAL | Status: AC
  Administered 2022-05-26: 5 mg via ORAL
  Filled 2022-05-26: qty 1

## 2022-05-26 MED ORDER — OXYCODONE HCL 5 MG PO TABS: 5.0000 mg | ORAL_TABLET | Freq: Once | ORAL | Status: DC

## 2022-05-26 NOTE — Progress Notes (Unsigned)
[facility] [phone]   [phone]  INITIAL CONSULTATION:  Patient Care Team: Darrow Bussing, MD as PCP - General (Family Medicine) Croitoru, Rachelle Hora, MD as PCP - Cardiology (Cardiology) Darrow Bussing, MD (Family Medicine) Annie Sable, MD as Consulting Physician (Nephrology)  CHIEF COMPLAINTS/PURPOSE OF CONSULTATION:  Pancreatic mass Liver lesions  HISTORY OF PRESENTING ILLNESS:  [name] 77 y.o. female with medical history significant for chronic combined systolic and diastolic CHF (EF 92-42 %), CHB s/p CRT-D, CKD stage IV, CAD s/p CABG, PAF not on anticoagulation, chronic respiratory failure with hypoxia on 3 L [place], asthma, T2DM, HTN, HLD, chronic thrombocytopenia, OSA intolerant to CPAP presents to the diagnostic clinic to further evaluation new pancreatic mass and liver lesions seen on recent CT imaging. Patient is accompanied by both her sons for this visit.   On review of the previous records, [name] was hospitalized from 05/11/2022-05/16/2022 due to due to acute renal failure on stage IV CKD. CT abdomen/pelvis was obtained as patient was having epigastric pain. Findings showed pancreatic head mass measuring 1.4 cm. Additionally, there are innumerable new hepatic hypodensities measuring up to 3.0 cm concerning for metastatic disease.   On exam today, [name] reports progressive fatigue. She requires assistance to do all her ADLs including getting dressed, eating and bathing. She is wheelchair bound due to weakness. She is currently living in a skilled facility. She has lost approximately 40 lbs over the last 3 months, which was unintentional. She has some post prandial nausea and vomiting. She has diffuse abdominal pain that radiates to her back. She is not taking any prescription pain medication at this time. She denies any bowel habits changes including recurrent episodes of diarrhea or constipation. She  does bruise easily but denies any signs of active bleeding. She has chronic shortness of breath that worsens with fluid overload. She is on lasix which improves her breathing. She is currently on 3 L of supplemental oxygen. She denies fevers, chills, sweats, chest pain or cough. She has no other complaints. Rest of the 10 point ROS is below.   MEDICAL HISTORY:  Past Medical History:  Diagnosis Date   AICD (automatic cardioverter/defibrillator) present    downgraded to CRT-P in 2014   Anemia    Arthritis    Asthma    Atrial fibrillation 10/24/2015   Questionable history of A Fib/A Flutter. On device interrogation on 9/7, showed 0.41min of A Fib/A Flutter with 1% burden.  Device check in Jan 2018 showed no sustained AF (runs of less than 30 seconds). No anticoagulation indicated.   CAD (coronary artery disease)    a. 10/09/16 LHC: SVG->LAD patent, SVG->Diag patent, SVG->RCA patent, SVG->LCx occluded. EF 60%, b. 10/31/16 LHC DES to AV groove Circ, DES to intermed branch   Chronic lower back pain    Fatty liver disease, nonalcoholic    GERD (gastroesophageal reflux disease)    Gout    H/O hiatal hernia    High cholesterol    Hyperplastic colon polyp    Hypertension    IBS (irritable bowel syndrome)    Internal hemorrhoids    INTERNAL HEMORRHOIDS WITHOUT MENTION COMP 04/12/2007   Qualifier: Diagnosis of  By: Juanda Chance MD, Hedwig Morton    Nonischemic cardiomyopathy 11/22/2011   Pt responded to BiV ICD- last EF 55-60% Sept 2017   Obesity    OSA (obstructive sleep apnea)    "can't tolerate a mask", wears 2L nocturnal O2   Presence of permanent  cardiac pacemaker    11/02/12 [employer] V273 INTUA PPM   Stroke 09/2020   Type II diabetes mellitus     SURGICAL HISTORY: Past Surgical History:  Procedure Laterality Date   ABDOMINAL ULTRASOUND  12/01/2011   Peripelvic cysts- #1- 2.4x1.9x2.3cm, #2-1.2x0.9x1.2cm   ANKLE FRACTURE SURGERY Right    "had rod put in"   ANTERIOR CERVICAL  DECOMP/DISCECTOMY FUSION     APPENDECTOMY     BACK SURGERY     BIOPSY  12/29/2017   Procedure: BIOPSY;  Surgeon: Napoleon Form, MD;  Location: [facility];  Service: Endoscopy;;   BIOPSY  03/20/2022   Procedure: BIOPSY;  Surgeon: Shellia Cleverly, DO;  Location: [facility];  Service: Gastroenterology;;   BIV ICD INSERTION CRT-D  2001?   BIV PACEMAKER GENERATOR CHANGE OUT N/A 11/02/2012   Procedure: BIV PACEMAKER GENERATOR CHANGE OUT;  Surgeon: Thurmon Fair, MD;  Location: [facility];  Service: Cardiovascular;  Laterality: N/A;   CARDIAC CATHETERIZATION  05/17/1999   No significant coronary obstructive disease w/ mild 20% luminal irregularity of the first diag branch of the LAD   CARDIAC CATHETERIZATION  07/08/2002   No significant CAD, moderately depressed LV systolic function   CARDIAC CATHETERIZATION Bilateral 04/26/2007   Normal findings, recommend medical therapy   CARDIAC CATHETERIZATION  02/18/2008   Moderate CAD, would benefit from having a functional study, recommend continue medical therapy   CARDIAC CATHETERIZATION  07/23/2012   Medical therapy   CARDIAC CATHETERIZATION N/A 11/24/2014   Procedure: Left Heart Cath and Coronary Angiography;  Surgeon: Lennette Bihari, MD;  Location: [facility] INVASIVE CV LAB;  Service: Cardiovascular;  Laterality: N/A;   CARDIAC CATHETERIZATION  11/27/2014   Procedure: Intravascular Pressure Wire/FFR Study;  Surgeon: Peter M Swaziland, MD;  Location: [facility] INVASIVE CV LAB;  Service: Cardiovascular;;   CARDIAC CATHETERIZATION  10/09/2016   CHOLECYSTECTOMY N/A 04/09/2018   Procedure: LAPAROSCOPIC CHOLECYSTECTOMY;  Surgeon: Gaynelle Adu, MD;  Location: [facility] ORS;  Service: General;  Laterality: N/A;   COLONOSCOPY WITH PROPOFOL N/A 12/29/2017   Procedure: COLONOSCOPY WITH PROPOFOL;  Surgeon: Napoleon Form, MD;  Location: [facility];  Service: Endoscopy;  Laterality: N/A;   CONVERSION TO TOTAL HIP Left 02/12/2022   Procedure: NAIL REMOVAL WITH CONVERSION TO  TOTAL HIP;  Surgeon: Joen Laura, MD;  [facility]: [facility];  Service: Orthopedics;  Laterality: Left;   CORONARY ANGIOGRAM  2010   CORONARY ARTERY BYPASS GRAFT N/A 11/29/2014   Procedure: CORONARY ARTERY BYPASS GRAFTING x 5 (LIMA-LAD, SVG-D, SVG-OM1-OM2, SVG-PD);  Surgeon: Loreli Slot, MD;  Location[facility] OR;  Service: Open Heart Surgery;  Laterality: N/A;   CORONARY STENT INTERVENTION N/A 10/31/2016   Procedure: CORONARY STENT INTERVENTION;  Surgeon: Kathleene Hazel, MD;  [facility]VAS[facility] CV LAB;  Service: Cardiovascular;  Laterality: N/A;   ESOPHAGOGASTRODUODENOSCOPY (EGD) WITH PROPOFOL N/A 12/29/2017   Procedure: ESOPHAGOGASTRODUODENOSCOPY (EGD) WITH PROPOFOL;  Surgeon: Napoleon Form, MD;  [facility];  Service: Endoscopy;  Laterality: N/A;   ESOPHAGOGASTRODUODENOSCOPY (EGD) WITH PROPOFOL N/A 03/20/2022   Procedure: ESOPHAGOGASTRODUODENOSCOPY (EGD) WITH PROPOFOL;  Surgeon: Shellia Cleverly, DO;  [facility];  Service: Gastroenterology;  Laterality: N/A;   FRACTURE SURGERY     INSERT / REPLACE / REMOVE PACEMAKER  1999   INTRAMEDULLARY (IM) NAIL INTERTROCHANTERIC Left 04/04/2021   Procedure: INTRAMEDULLARY (IM) NAIL INTERTROCHANTRIC;  Surgeon: Huel Cote, MD;  [facility];  Service: Orthopedics;  Laterality: Left;   IR FLUORO GUIDE CV LINE LEFT  04/11/2021   IR US GUIDE VASC ACCESS LEFT  04/11/2021   KNEE ARTHROSCOPY Bilateral    LEFT HEART CATH AND CORS/GRAFTS ANGIOGRAPHY N/A 12/09/2017   Procedure: LEFT HEART CATH AND CORS/GRAFTS ANGIOGRAPHY;  Surgeon: Lennette Bihari, MD;  [facility] LAB;  Service: Cardiovascular;  Laterality: N/A;   LEFT HEART CATHETERIZATION WITH CORONARY ANGIOGRAM N/A 07/23/2012   Procedure: LEFT HEART CATHETERIZATION WITH CORONARY ANGIOGRAM;  Surgeon: Marykay Lex, MD;  Location: [facility] CATH LAB;  Service: Cardiovascular;  Laterality: N/A;   LEXISCAN MYOVIEW  11/14/2011   Mild-moderate defect seen in Mid  Inferolateral and Mid Anterolateral regions-consistant w/ infarct/scar. No significant ischemia demonstrated.   LUMBAR PERCUTANEOUS PEDICLE SCREW 4 LEVEL N/A 09/10/2021   Procedure: THORACIC SEVEN THROUGH THORACIC ELEVEN  PERCUTANEOUS PEDICLE SCREW PLACEMENT;  Surgeon: Jadene Pierini, MD;  [facility];  Service: Neurosurgery;  Laterality: N/A;   POLYPECTOMY  12/29/2017   Procedure: POLYPECTOMY;  Surgeon: Napoleon Form, MD;  [facility]: [facility];  Service: Endoscopy;;   PPM GENERATOR CHANGEOUT N/A 12/31/2020   Procedure: PPM GENERATOR CHANGEOUT;  Surgeon: Thurmon Fair, MD;  [facility] LAB;  Service: Cardiovascular;  Laterality: N/A;   RIGHT/LEFT HEART CATH AND CORONARY ANGIOGRAPHY N/A 10/09/2016   Procedure: RIGHT/LEFT HEART CATH AND CORONARY ANGIOGRAPHY;  Surgeon: Dolores Patty, MD;  [facility] LAB;  Service: Cardiovascular;  Laterality: N/A;   RIGHT/LEFT HEART CATH AND CORONARY/GRAFT ANGIOGRAPHY N/A 03/11/2019   Procedure: RIGHT/LEFT HEART CATH AND CORONARY/GRAFT ANGIOGRAPHY;  Surgeon: Dolores Patty, MD;  [facility] LAB;  Service: Cardiovascular;  Laterality: N/A;   TEE WITHOUT CARDIOVERSION N/A 11/29/2014   Procedure: TRANSESOPHAGEAL ECHOCARDIOGRAM (TEE);  Surgeon: Loreli Slot, MD;  Location: [facility] OR;  Service: Open Heart Surgery;  Laterality: N/A;   TRANSCAROTID ARTERY REVASCULARIZATION  Right 10/01/2020   Procedure: RIGHT TRANSCAROTID ARTERY REVASCULARIZATION;  Surgeon: Cephus Shelling, MD;  Location: [facility] OR;  Service: Vascular;  Laterality: Right;   TRANSTHORACIC ECHOCARDIOGRAM  07/23/2012   EF 55-60%, normal-mild   TUBAL LIGATION     ULTRASOUND GUIDANCE FOR VASCULAR ACCESS Left 10/01/2020   Procedure: ULTRASOUND GUIDANCE FOR VASCULAR ACCESS;  Surgeon: Cephus Shelling, MD;  Location: [facility] OR;  Service: Vascular;  Laterality: Left;    SOCIAL HISTORY: Social History   Socioeconomic History   Marital status:  Widowed    Spouse name: Orlene Salmons- deceased   Number of children: 5   Years of education: Not on file   Highest education level: Not on file  Occupational History   Occupation: STAFF/BUFFET    Employer: [employer]  Tobacco Use   Smoking status: Former    Packs/day: 0.25    Years: 3.00    Additional pack years: 0.00    Total pack years: 0.75    Types: Cigarettes    Quit date: 02/11/1968    Years since quitting: 54.3   Smokeless tobacco: Never  Vaping Use   Vaping Use: Never used  Substance and Sexual Activity   Alcohol use: No   Drug use: No   Sexual activity: Never  Other Topics Concern   Not on file  Social History Narrative   Widowed last year. Spouse Angelia Mould Sr (46) died on date 01-15-2012 2022-06-19) at his home Was a [place] [employer] [place] [employer]   She lives with her two sons, Alisia Ferrari    Other sons Joseph Berkshire   Social  Determinants of Health   Financial Resource Strain: High Risk (12/18/2016)   Overall Financial Resource Strain (CARDIA)    Difficulty of Paying Living Expenses: Very hard  Food Insecurity: No Food Insecurity (05/11/2022)   Hunger Vital Sign    Worried About Running Out of Food in the Last Year: Never true    Ran Out of Food in the Last Year: Never true  Transportation Needs: No Transportation Needs (05/11/2022)   PRAPARE - Administrator, Civil Service (Medical): No    Lack of Transportation (Non-Medical): No  Physical Activity: Inactive (12/18/2016)   Exercise Vital Sign    Days of Exercise per Week: 0 days    Minutes of Exercise per Session: 0 min  Stress: Stress Concern Present (12/18/2016)   [employer] of Occupational Health - Occupational Stress Questionnaire    Feeling of Stress : Very much  Social Connections: Moderately Integrated (02/05/2021)   Social Connection and Isolation Panel [NHANES]    Frequency of Communication with Friends and Family: Three times a week     Frequency of Social Gatherings with Friends and Family: Three times a week    Attends Religious Services: 1 to 4 times per year    Active Member of Clubs or Organizations: Yes    Attends Banker Meetings: 1 to 4 times per year    Marital Status: Widowed  Intimate Partner Violence: Not At Risk (05/11/2022)   Humiliation, Afraid, Rape, and Kick questionnaire    Fear of Current or Ex-Partner: No    Emotionally Abused: No    Physically Abused: No    Sexually Abused: No    FAMILY HISTORY: Family History  Problem Relation Age of Onset   Breast cancer Mother    Diabetes Mother    Asthma Sister    Heart disease Maternal Grandmother    Kidney disease Maternal Grandmother    Diabetes Maternal Grandmother    Glaucoma Maternal Aunt    Heart disease Maternal Aunt    Colon cancer Neg Hx    Stomach cancer Neg Hx    Pancreatic cancer Neg Hx     ALLERGIES:  is allergic to ivp dye [iodinated contrast media], shellfish allergy, sulfa antibiotics, iodine, benadryl [diphenhydramine], lipitor [atorvastatin], mitigare [colchicine], uloric [febuxostat], vibra-tab [doxycycline], keflex [cephalexin], and zithromax [azithromycin].  MEDICATIONS:  Current Outpatient Medications  Medication Sig Dispense Refill   acetaminophen (TYLENOL) 325 MG tablet Take 2 tablets (650 mg total) by mouth every 6 (six) hours as needed for mild pain (or Fever >/= 101).     albuterol (PROVENTIL HFA;VENTOLIN HFA) 108 (90 Base) MCG/ACT inhaler Inhale 2 puffs into the lungs every 6 (six) hours as needed for wheezing or shortness of breath. 1 Inhaler 0   albuterol (PROVENTIL) (2.5 MG/3ML) 0.083% nebulizer solution Take 3 mLs (2.5 mg total) by nebulization every 6 (six) hours as needed for wheezing or shortness of breath. 75 mL 0   allopurinol (ZYLOPRIM) 100 MG tablet Take 1 tablet (100 mg total) by mouth daily. Please contact the office to schedule appointment for additional refills. 1st Attempt. (Patient taking  differently: Take 100 mg by mouth daily.) 30 tablet 0   aspirin EC 81 MG tablet Take 1 tablet (81 mg total) by mouth daily. Swallow whole. 30 tablet 12   barrier cream (NON-SPECIFIED) CREA Apply 1 Application topically See admin instructions. Apply topically to buttocks every shift and as needed for each incontinence episode.     budesonide-formoterol (SYMBICORT) 160-4.5 MCG/ACT inhaler Inhale  1 puff into the lungs 2 (two) times daily.     Cal Carb-Mag Hydrox-Simeth (MYLANTA COAT & COOL) 1200-270-80 MG/10ML SUSP Take 30 mLs by mouth 3 (three) times daily as needed (gas pains).     camphor-menthol (SARNA) lotion Apply topically as needed for itching. 222 mL 0   carboxymethylcellulose (REFRESH PLUS) 0.5 % SOLN Place 1 drop into both eyes 3 (three) times daily as needed (dry eyes).     cycloSPORINE (RESTASIS) 0.05 % ophthalmic emulsion Place 1 drop into both eyes 2 (two) times daily.     dicyclomine (BENTYL) 10 MG capsule Take 1 capsule (10 mg total) by mouth every 6 (six) hours as needed for spasms. 60 capsule 3   fluticasone (FLONASE) 50 MCG/ACT nasal spray Place 1 spray into both nostrils daily as needed for allergies.     folic acid (FOLVITE) 1 MG tablet Take 1 mg by mouth daily.     furosemide (LASIX) 20 MG tablet Take 20 mg by mouth daily.     hydrOXYzine (ATARAX) 25 MG tablet Take 25 mg by mouth 2 (two) times daily as needed for itching.     isosorbide mononitrate (IMDUR) 60 MG 24 hr tablet Take 0.5 tablets (30 mg total) by mouth daily.     Lido-PE-Glycerin-Petrolatum (PREPARATION H RAPID RELIEF RE) Place 1 spray rectally every 6 (six) hours as needed (hemorrhoids).     lidocaine 4 % Place 1 patch onto the skin daily as needed (pain).     LORazepam (ATIVAN) 0.5 MG tablet Take 0.5 mg by mouth every 8 (eight) hours as needed for anxiety.     methocarbamol (ROBAXIN) 500 MG tablet Take 500 mg by mouth every 6 (six) hours as needed for muscle spasms.     naloxegol oxalate (MOVANTIK) 12.5 MG TABS  tablet Take 1 tablet (12.5 mg total) by mouth daily. 30 tablet 3   Nystatin (GERHARDT'S BUTT CREAM) CREA Apply 1 Application topically 2 (two) times daily. 1 each 1   ondansetron (ZOFRAN) 4 MG tablet Take 4 mg by mouth every 8 (eight) hours as needed for nausea or vomiting.     OXYGEN Inhale 2-3 L/min into the lungs continuous.     oxymetazoline (AFRIN) 0.05 % nasal spray Place 1 spray into both nostrils 2 (two) times daily as needed for congestion.     pantoprazole (PROTONIX) 40 MG tablet Take 1 tablet (40 mg total) by mouth 2 (two) times daily. 180 tablet 3   polyethylene glycol powder (GLYCOLAX/MIRALAX) 17 GM/SCOOP powder Take 17 g by mouth daily as needed (constipation).     POTASSIUM CHLORIDE ER PO Take 20 mEq by mouth daily.     rosuvastatin (CRESTOR) 40 MG tablet Take 1 tablet (40 mg total) by mouth every evening. 30 tablet 1   simethicone (MYLICON) 80 MG chewable tablet Chew 80 mg by mouth 4 (four) times daily as needed for flatulence.     ticagrelor (BRILINTA) 90 MG TABS tablet Take 1 tablet (90 mg total) by mouth 2 (two) times daily. 60 tablet 0   vitamin B-12 (CYANOCOBALAMIN) 1000 MCG tablet Take 1 tablet (1,000 mcg total) by mouth daily. 30 tablet 1   ferrous sulfate 325 (65 FE) MG tablet Take 1 tablet (325 mg total) by mouth daily with breakfast. 30 tablet 0   nitroGLYCERIN (NITROSTAT) 0.4 MG SL tablet Place 1 tablet (0.4 mg total) under the tongue every 5 (five) minutes as needed for chest pain. (Patient taking differently: Place 0.4 mg under the tongue  every 5 (five) minutes x 3 doses as needed for chest pain.) 25 tablet 3   oxyCODONE (OXY IR/ROXICODONE) 5 MG immediate release tablet Take 1 tablet (5 mg total) by mouth every 4 (four) hours as needed for severe pain. 120 tablet 0   No current facility-administered medications for this visit.    REVIEW OF SYSTEMS:   Constitutional: ( - ) fevers, ( - )  chills , ( - ) night sweats Eyes: ( - ) blurriness of vision, ( - ) double  vision, ( - ) watery eyes Ears, nose, mouth, throat, and face: ( - ) mucositis, ( - ) sore throat Respiratory: ( - ) cough, ( +) dyspnea, ( - ) wheezes Cardiovascular: ( - ) palpitation, ( - ) chest discomfort, ( - ) lower extremity swelling Gastrointestinal:  ( + ) nausea, ( - ) heartburn, ( - ) change in bowel habits Skin: ( - ) abnormal skin rashes Lymphatics: ( - ) new lymphadenopathy, (+ ) easy bruising Neurological: ( - ) numbness, ( - ) tingling, ( - ) new weaknesses Behavioral/Psych: ( - ) mood change, ( - ) new changes  All other systems were reviewed with the patient and are negative.  PHYSICAL EXAMINATION: ECOG PERFORMANCE STATUS: 3 - Symptomatic, >50% confined to bed  Vitals:   05/26/22 0906  BP: (!) 93/52  Pulse: 78  Resp: 15  Temp: 97.6 F (36.4 C)  SpO2: 98%   Filed Weights   05/26/22 0906  Weight: 179 lb 12.8 oz (81.6 kg)    GENERAL: elderly appearing female. Patient is wheelchair bound.  SKIN: skin color, texture, turgor are normal, no rashes or significant lesions. Bruising noted in upper extremities.  EYES: conjunctiva are pink and non-injected, sclera clear LUNGS: clear to auscultation and percussion with normal breathing effort HEART: regular rate & rhythm and no murmurs and no lower extremity edema Musculoskeletal: no cyanosis of digits and no clubbing  PSYCH: alert & oriented x 3, fluent speech NEURO: no focal motor/sensory deficits  LABORATORY DATA:  I have reviewed the data as listed    Latest Ref Rng & Units 05/15/2022    4:14 AM 05/12/2022    4:23 AM 05/11/2022    4:01 PM  CBC  WBC 4.0 - 10.5 K/uL 7.7  9.4  11.0   Hemoglobin 12.0 - 15.0 g/dL 9.4  16.1  09.6   Hematocrit 36.0 - 46.0 % 31.6  32.2  36.7   Platelets 150 - 400 K/uL 43  81  93        Latest Ref Rng & Units 05/15/2022    4:14 AM 05/14/2022    4:37 AM 05/13/2022    4:36 AM  CMP  Glucose 70 - 99 mg/dL 045  409  811   BUN 8 - 23 mg/dL 38  48  55   Creatinine 0.44 - 1.00 mg/dL 9.14   7.82  9.56   Sodium 135 - 145 mmol/L 136  136  137   Potassium 3.5 - 5.1 mmol/L 3.9  4.0  4.0   Chloride 98 - 111 mmol/L 111  110  109   CO2 22 - 32 mmol/L 20  20  21    Calcium 8.9 - 10.3 mg/dL 8.2  8.1  8.2      RADIOGRAPHIC STUDIES: I have personally reviewed the radiological images as listed and agreed with the findings in the report. CUP PACEART REMOTE DEVICE CHECK  Result Date: 05/13/2022 Scheduled remote reviewed. Normal device function.  Next remote 91 days. LA, CV[facility]  DG HIP UNILAT W OR W/O PELVIS 2-3 VIEWS LEFT  Result Date: 05/12/2022 CLINICAL DATA:  Postoperative left hip surgery 02/13/2022 EXAM: DG HIP (WITH OR WITHOUT PELVIS) 2-3V LEFT COMPARISON:  CT abdomen pelvis 05/11/2022, left hip radiographs 02/17/2022 FINDINGS: Redemonstration of total left hip arthroplasty with moderately long femoral stem. Lateral hook plate and cerclage wiring around the proximal to mid aspects of the femoral stem, similar to prior. No perihardware lucency is seen to indicate hardware failure or loosening. Mild superolateral right femoroacetabular joint space narrowing and peripheral osteophytosis. IMPRESSION: 1. Status post total left hip arthroplasty without evidence of hardware failure. 2. Mild right femoroacetabular osteoarthritis. Electronically Signed   By: Neita Garnet M.D.   On: 05/12/2022 14:24   US RENAL  Result Date: 05/11/2022 CLINICAL DATA:  Acute on chronic renal insufficiency EXAM: RENAL / URINARY TRACT ULTRASOUND COMPLETE COMPARISON:  05/11/2022 FINDINGS: Right Kidney: Renal measurements: 10.0 x 4.6 x 4.7 cm = volume: 111.4 mL. Increased renal cortical echotexture consistent with medical renal disease. No hydronephrosis or nephrolithiasis. There are 2 simple renal cysts identified, largest in the lower pole measuring 2.7 x 2.0 x 2.3 cm, and a smaller in the upper pole measuring 2.2 x 1.4 x 1.7 cm. Left Kidney: Renal measurements: 8.8 x 4.2 by 4.9 cm = volume: 93.9 mL. Increased renal  cortical echotexture. No hydronephrosis or nephrolithiasis. Exophytic simple cyst off the mid left kidney measures 1.7 by 1.3 by 1.5 cm. There is also a 1.3 cm simple cortical cyst lower pole left kidney. Bladder: Appears normal for degree of bladder distention. Other: None. IMPRESSION: 1. Bilateral increased renal cortical echotexture consistent with medical renal disease. 2. Bilateral simple appearing renal cysts. No specific imaging follow-up is required. Electronically Signed   By: Sharlet Salina M.D.   On: 05/11/2022 23:20   CT ABDOMEN PELVIS WO CONTRAST  Result Date: 05/11/2022 CLINICAL DATA:  Epigastric pain EXAM: CT ABDOMEN AND PELVIS WITHOUT CONTRAST TECHNIQUE: Multidetector CT imaging of the abdomen and pelvis was performed following the standard protocol without IV contrast. RADIATION DOSE REDUCTION: This exam was performed according to the departmental dose-optimization program which includes automated exposure control, adjustment of the mA and/or kV according to patient size and/or use of iterative reconstruction technique. COMPARISON:  CT abdomen and pelvis dated 03/17/2022 FINDINGS: Lower chest: No focal consolidation or pulmonary nodule in the lung bases. No pleural effusion or pneumothorax demonstrated. Partially imaged heart size is normal. Partially imaged median sternotomy wires are nondisplaced. Hepatobiliary: Innumerable new hepatic hypodensities measuring up to 3.0 cm (2:16). No intra or extrahepatic biliary ductal dilation. Cholecystectomy. Pancreas: Ill-defined 1.4 cm hypoattenuating focus within the pancreatic head (2:29), not well seen on prior examination. No main pancreatic ductal dilation. Spleen: Normal in size without focal abnormality. Adrenals/Urinary Tract: No adrenal nodules. Bilateral renal cortical thinning. No hydronephrosis or calculi. 2.2 cm right lower pole exophytic lesion does not demonstrate simple fluid attenuation. Additional bilateral simple-appearing renal cysts.  No specific follow-up imaging recommended. No focal bladder wall thickening. Stomach/Bowel: Normal appearance of the stomach. No evidence of bowel wall thickening, distention, or inflammatory changes. Colonic diverticulosis without acute diverticulitis. Appendix is not discretely seen. Vascular/Lymphatic: Aortic atherosclerosis. Retroaortic left renal vein. No enlarged abdominal or pelvic lymph nodes. Reproductive: No adnexal masses. Other: Mild presacral edema.  No free air. Musculoskeletal: Lytic focus along the medial left acetabulum (2:66) measuring 2.3 cm at the site of prior compression screw. Status post left hip arthroplasty. Postsurgical  changes from partially imaged thoracic spinal fixation. Unchanged fracture of T9. Multilevel degenerative changes of the partially imaged thoracic and lumbar spine. Unchanged grade 1 anterolisthesis at L5-S1. IMPRESSION: 1. Innumerable new hepatic hypodensities measuring up to 3.0 cm suspicious for metastatic disease. 2. Ill-defined 1.4 cm hypoattenuating focus within the pancreatic head, not well seen on prior examination, suspicious for primary pancreatic neoplasm. 3. Lytic focus along the medial left acetabulum measuring up to 2.3 cm at the site of prior compression screw may be postsurgical. 4. Indeterminate 2.2 cm right renal lower pole exophytic lesion. Given the patient's history of contraindication to MRI and contrast-enhanced CT, ultrasound of the kidneys can be considered for further characterization. 5.  Aortic Atherosclerosis (ICD10-I70.0). These results were called by telephone at the time of interpretation on 05/11/2022 at 7:37 pm to provider [facility] , who verbally acknowledged these results. Electronically Signed   By: Agustin Cree M.D.   On: 05/11/2022 19:41   DG Chest Portable 1 View  Result Date: 05/11/2022 CLINICAL DATA:  Shortness of breath EXAM: PORTABLE CHEST 1 VIEW COMPARISON:  04/21/2022 FINDINGS: Cardiomegaly. Prior CABG. Right pacer remains  in place, unchanged. No confluent airspace opacities, effusions or edema. Aortic atherosclerosis. No acute bony abnormality. IMPRESSION: Cardiomegaly.  No active disease. Electronically Signed   By: Charlett Nose M.D.   On: 05/11/2022 17:50   DG Foot Complete Left  Result Date: 05/11/2022 CLINICAL DATA:  Wounds to the second and third digits EXAM: LEFT FOOT - COMPLETE 3 VIEW COMPARISON:  None Available. FINDINGS: There is no definite evidence of fracture or dislocation. Old fracture of the partially imaged distal fibula. Degenerative changes of the foot. Soft tissues are unremarkable. IMPRESSION: 1. No definite evidence of fracture or dislocation. 2. Old fracture of the partially imaged distal fibula. 3. No soft tissue abnormality to correspond to reported wounds. Electronically Signed   By: Agustin Cree M.D.   On: 05/11/2022 17:49    ASSESSMENT & PLAN KENITHA GLENDINNING is a 77 y.o. female who presents to the diagnostic clinic for evaluation of pancreatic mass and liver lesions seen on CT scan from 05/11/2022. I reviewed the workup that is needed to confirm the diagnosis which includes US guided liver biopsy and CT chest to complete staging. Based on review of CT abdomen/pelvis, findings are most concerning for Stage IV pancreatic cancer involving the liver. I explained that stage IV pancreatic cancer is incurable and mainstay treatment will be chemotherapy. Unfortunately, with patient's baseline performance status and other medical conditions, patient is not a good candidate for systemic chemotherapy. The recommendation would be to not pursue any diagnostic testing and focus on quality of life with palliative care. We will make a referral to our palliative care team today.   Regarding patient's ongoing abdominal and back pain, I sent a prescription for oxycodone 5 mg that she can take every 4 hours as needed for pain relief. Since patient was in pain during the visit, we gave her a dose in clinic.   Patient  and her two sons expressed understanding of the underlying findings and was in agreement with our recommendations. All questions were answered. The patient and her sons knows to call the clinic with any problems, questions or concerns.  Orders Placed This Encounter  Procedures   Amb Referral to Palliative Care    Referral Priority:   Urgent    Referral Type:   Consultation    Referral Reason:   Goals of Care    Number  of Visits Requested:   1      I have spent a total of 60 minutes minutes of face-to-face and non-face-to-face time, preparing to see the patient, obtaining and/or reviewing separately obtained history, performing a medically appropriate examination, counseling and educating the patient, ordering medications, referring and communicating with other health care professionals, documenting clinical information in the electronic health record,  and care coordination.   Georga Kaufmann, PA-C Department of Hematology/Oncology [facility] Phone: [phone]  Patient was seen with Dr. Leonides Schanz.  I have read the above note and personally examined the patient. I agree with the assessment and plan as noted above.  Briefly [name] is a 77 year old female who presents for evaluation of what appears to be metastatic pancreatic cancer.  The patient has a pancreatic mass as well as numerous lesions throughout the liver.  The patient has numerous comorbidities including heart disease, chronic kidney disease, and COPD.  I discussed with her and her family that she is not a good candidate for systemic treatment.  Additionally the prognosis of pancreatic cancer is quite poor and the chemotherapy in her situation would likely cause more harm than good.  Overall I would recommend comfort based care/hospice given her overall poor functional status.  We also discussed utility of biopsy.  Given that we do not plan to intervene with chemotherapy I do not think  biopsy should be required.  Our clinical suspicion is for pancreatic cancer.  The patient voiced understanding of our plan and was willing to meet with our palliative care disservice to discuss further.   Ulysees Barns, MD Department of Hematology/Oncology [facility] at [facility] Phone: [phone] Pager: [phone] Email: [email or web address]

## 2022-05-27 DIAGNOSIS — J9611 Chronic respiratory failure with hypoxia: Secondary | ICD-10-CM | POA: Diagnosis not present

## 2022-05-27 DIAGNOSIS — M6281 Muscle weakness (generalized): Secondary | ICD-10-CM | POA: Diagnosis not present

## 2022-05-27 DIAGNOSIS — R2689 Other abnormalities of gait and mobility: Secondary | ICD-10-CM | POA: Diagnosis not present

## 2022-05-27 DIAGNOSIS — I5033 Acute on chronic diastolic (congestive) heart failure: Secondary | ICD-10-CM | POA: Diagnosis not present

## 2022-05-27 DIAGNOSIS — C259 Malignant neoplasm of pancreas, unspecified: Secondary | ICD-10-CM | POA: Diagnosis not present

## 2022-05-27 DIAGNOSIS — I5042 Chronic combined systolic (congestive) and diastolic (congestive) heart failure: Secondary | ICD-10-CM | POA: Diagnosis not present

## 2022-05-29 ENCOUNTER — Other Ambulatory Visit: Payer: Self-pay | Admitting: *Deleted

## 2022-05-29 ENCOUNTER — Encounter (HOSPITAL_COMMUNITY): Payer: Self-pay

## 2022-05-29 DIAGNOSIS — J9611 Chronic respiratory failure with hypoxia: Secondary | ICD-10-CM | POA: Diagnosis not present

## 2022-05-29 DIAGNOSIS — I5033 Acute on chronic diastolic (congestive) heart failure: Secondary | ICD-10-CM | POA: Diagnosis not present

## 2022-05-29 DIAGNOSIS — M6281 Muscle weakness (generalized): Secondary | ICD-10-CM | POA: Diagnosis not present

## 2022-05-29 DIAGNOSIS — R2689 Other abnormalities of gait and mobility: Secondary | ICD-10-CM | POA: Diagnosis not present

## 2022-05-29 NOTE — Patient Outreach (Signed)
[facility] Post- Acute Care Coordinator follow up. Mrs. Campus resides in [facility].  Screening for potential care coordination services as a benefit of health plan and PCP.  Met with [name] social worker. Hospice consult was made on yesterday. Mrs. Bouie will remain in the facility for long term care with hospice services.   Will follow up to confirm at later date.   [name], MSN, RN,BSN [facility] Post Acute Care Coordinator [phone] (Direct dial)

## 2022-05-30 ENCOUNTER — Telehealth: Payer: Self-pay | Admitting: Nurse Practitioner

## 2022-05-30 DIAGNOSIS — I5033 Acute on chronic diastolic (congestive) heart failure: Secondary | ICD-10-CM | POA: Diagnosis not present

## 2022-05-30 NOTE — Telephone Encounter (Signed)
Contacted patient's son, Loraine Leriche in reference to recent Palliative referral. Son shares patient has enrolled with hospice via [place] this week. She will no longer require care or support at [facility]. Notified [place], [place]. Support provided.

## 2022-06-02 DIAGNOSIS — I5033 Acute on chronic diastolic (congestive) heart failure: Secondary | ICD-10-CM | POA: Diagnosis not present

## 2022-06-06 IMAGING — CR DG CHEST 2V
2 series · 2 of 2 positions shown · non-contrast
Comparison: May 02, 2021

CLINICAL DATA: Shortness of breath.

EXAM:
CHEST - 2 VIEW

[chest lat]
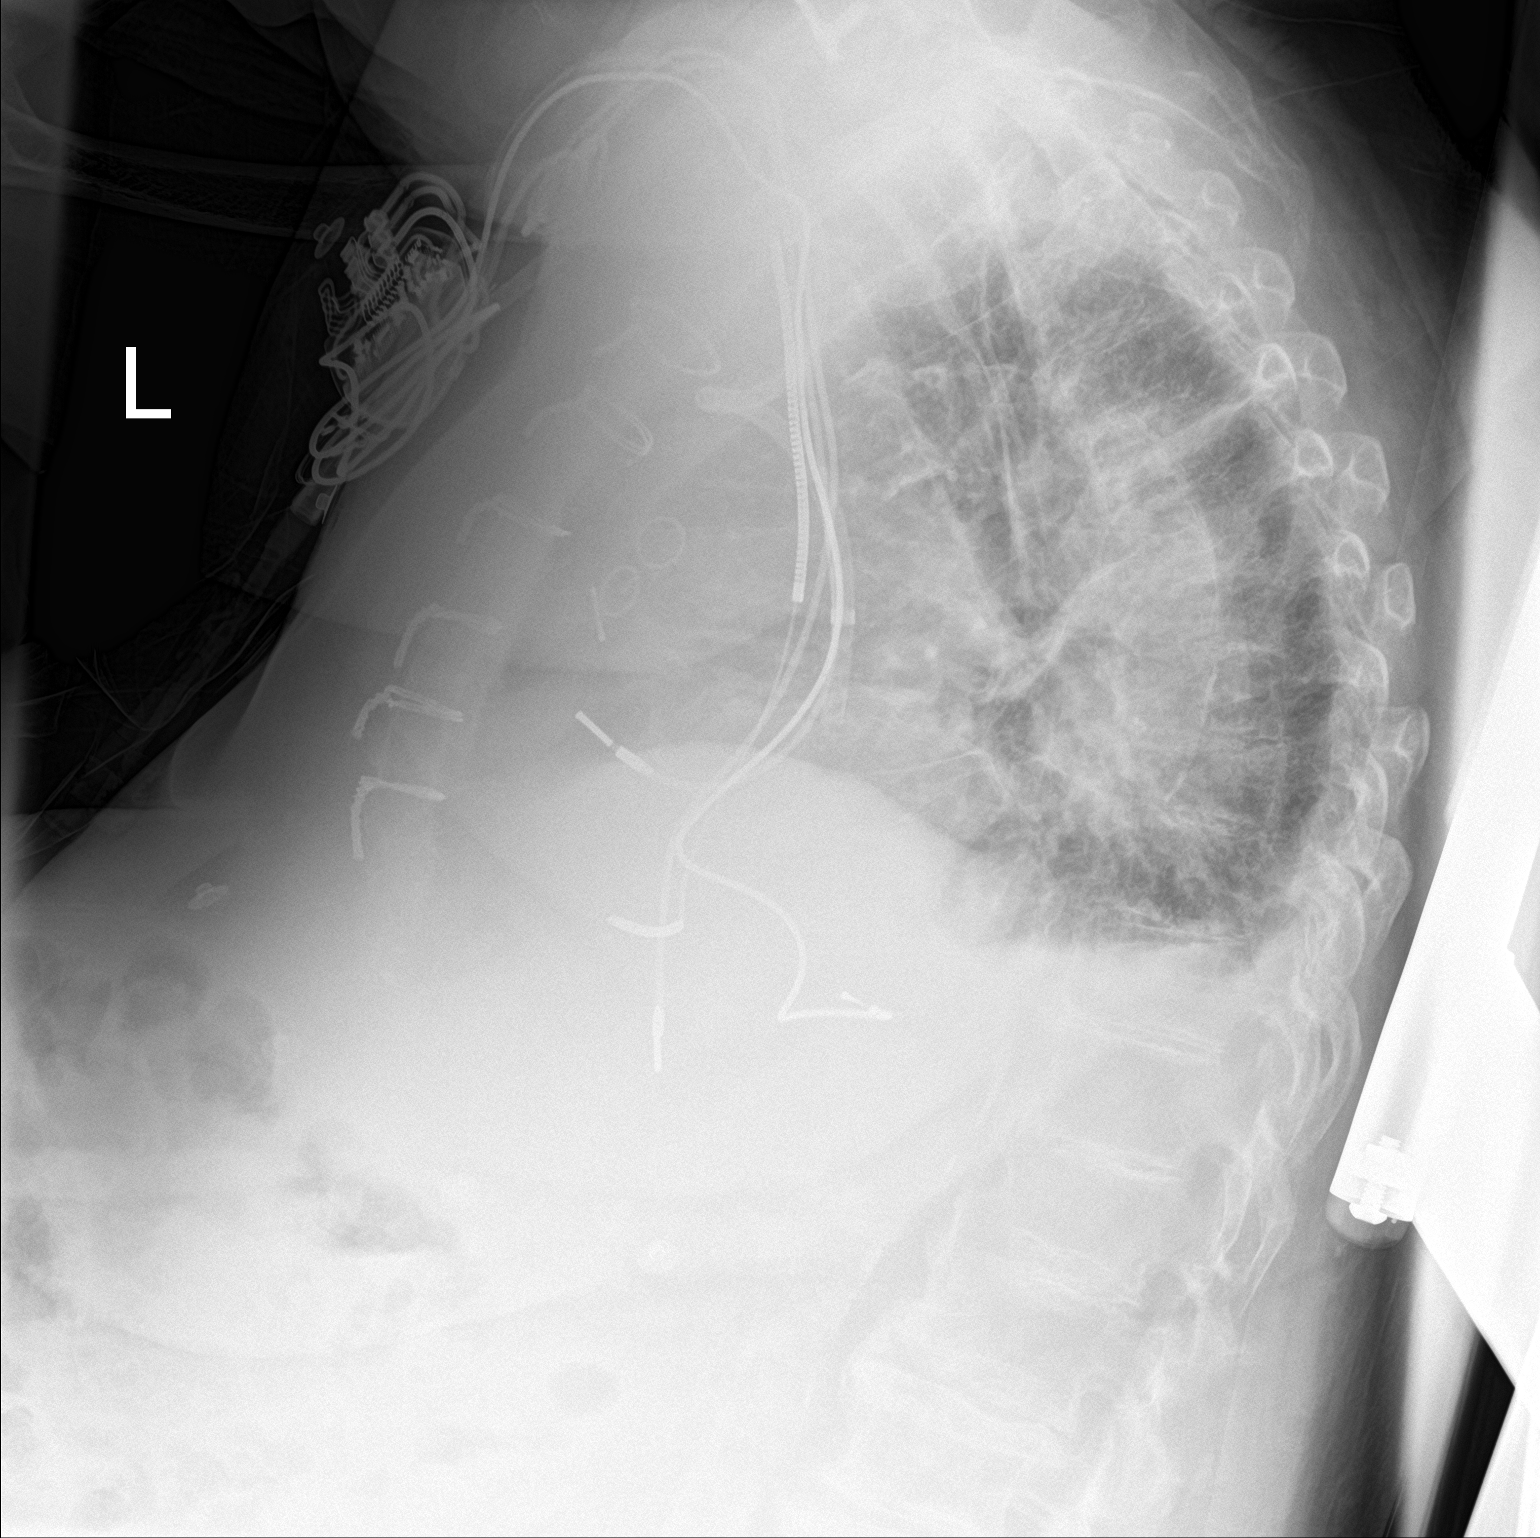

[chest ap]
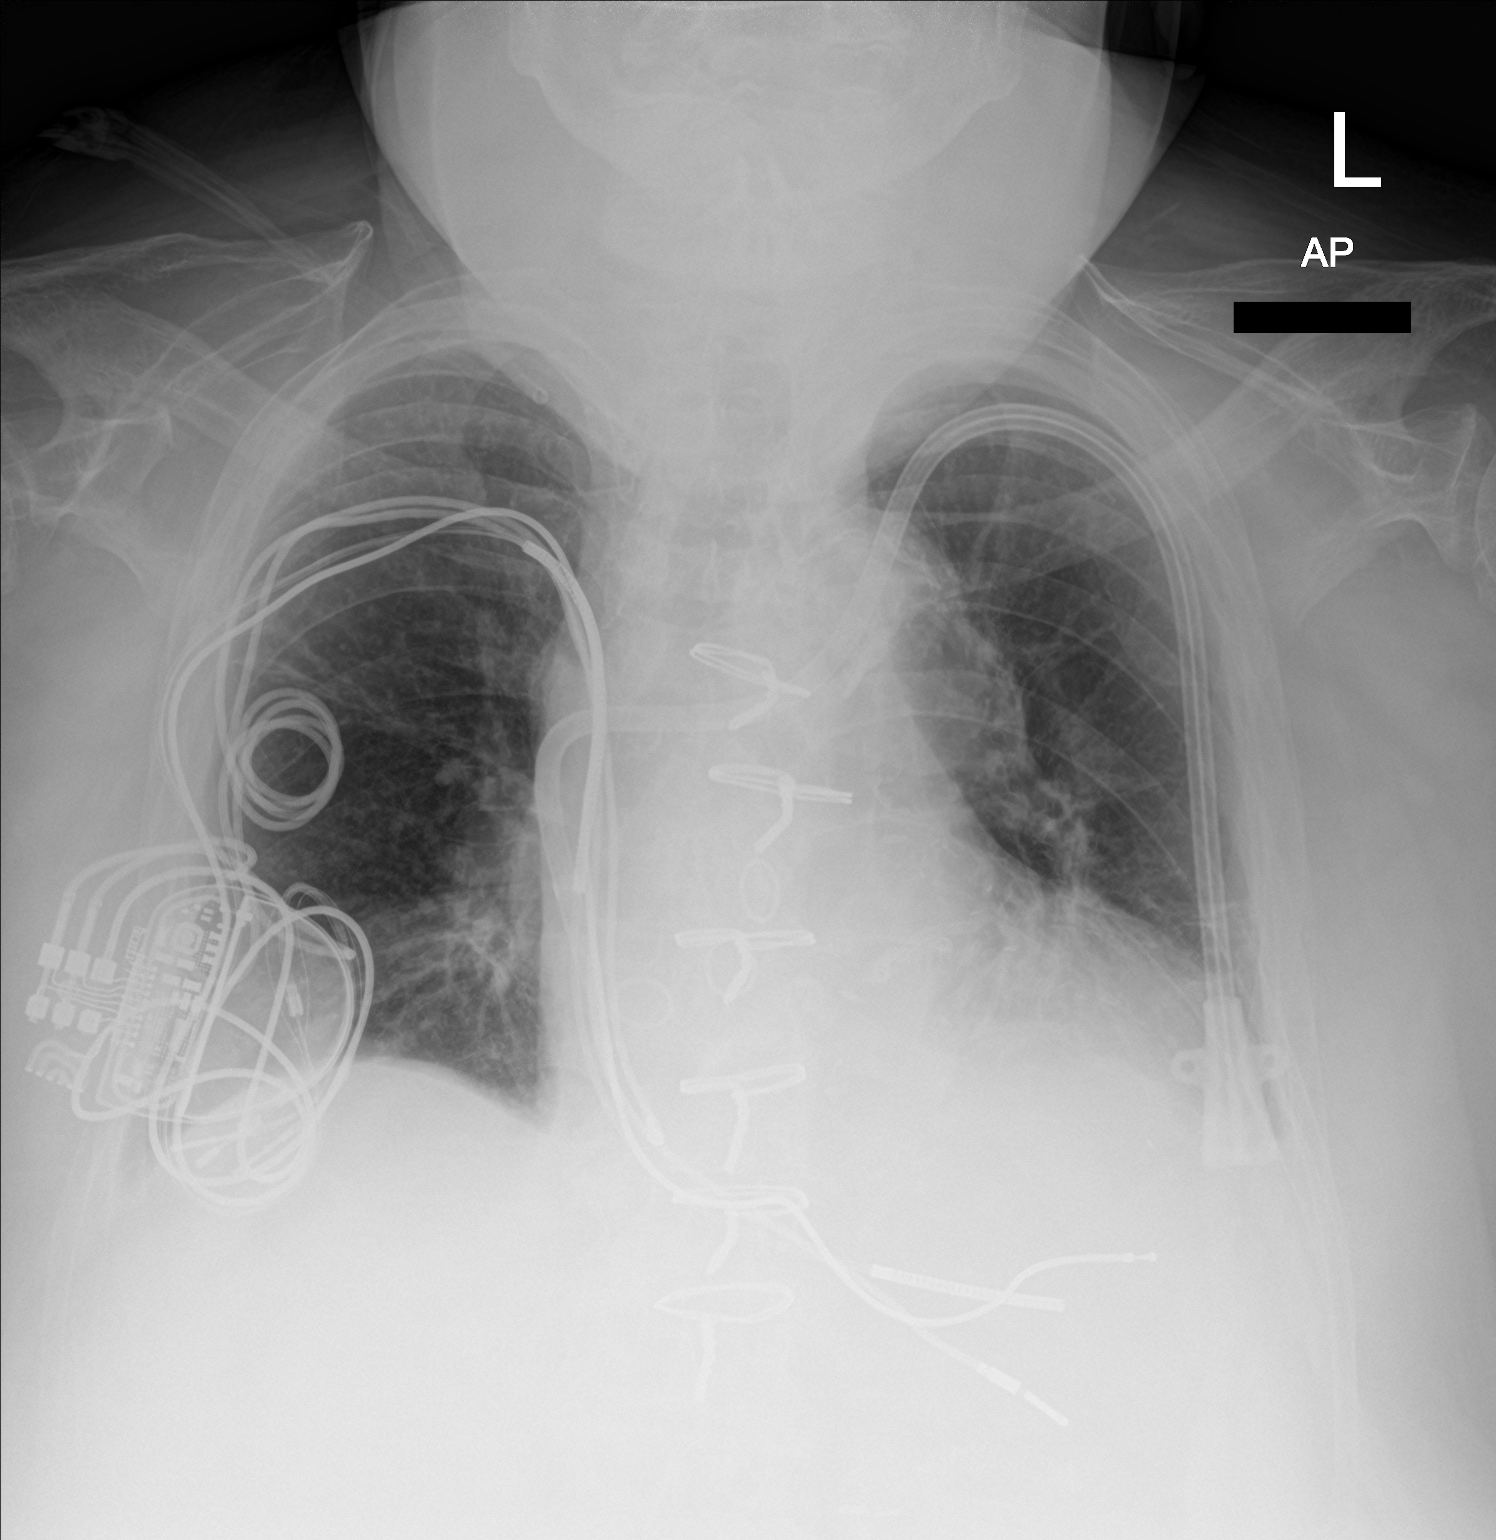

[2 of 2 positions shown; findings below may reference images not displayed]

FINDINGS: Multiple sternal wires are present. There is stable left-sided
venous catheter positioning. A multi lead AICD is in place. There
are low lung volumes with mild left basilar atelectasis and/or early
infiltrate. There is no evidence of a pleural effusion or
pneumothorax. The heart size and mediastinal contours are within
normal limits. Multilevel degenerative changes seen throughout the
thoracic spine.
IMPRESSION: Low lung volumes with mild left basilar atelectasis and/or early
infiltrate.

## 2022-06-07 DIAGNOSIS — I5022 Chronic systolic (congestive) heart failure: Secondary | ICD-10-CM | POA: Diagnosis not present

## 2022-06-07 DIAGNOSIS — J9611 Chronic respiratory failure with hypoxia: Secondary | ICD-10-CM | POA: Diagnosis not present

## 2022-06-07 DIAGNOSIS — M6281 Muscle weakness (generalized): Secondary | ICD-10-CM | POA: Diagnosis not present

## 2022-06-07 DIAGNOSIS — M19072 Primary osteoarthritis, left ankle and foot: Secondary | ICD-10-CM | POA: Diagnosis not present

## 2022-06-07 DIAGNOSIS — J9612 Chronic respiratory failure with hypercapnia: Secondary | ICD-10-CM | POA: Diagnosis not present

## 2022-06-07 DIAGNOSIS — J9621 Acute and chronic respiratory failure with hypoxia: Secondary | ICD-10-CM | POA: Diagnosis not present

## 2022-06-07 DIAGNOSIS — N1832 Chronic kidney disease, stage 3b: Secondary | ICD-10-CM | POA: Diagnosis not present

## 2022-06-07 IMAGING — DX DG CHEST 1V PORT
1 series · 1 of 1 positions shown · non-contrast
Comparison: 06/10/2021

CLINICAL DATA: Shortness of breath

EXAM:
PORTABLE CHEST - 1 VIEW

[chest]
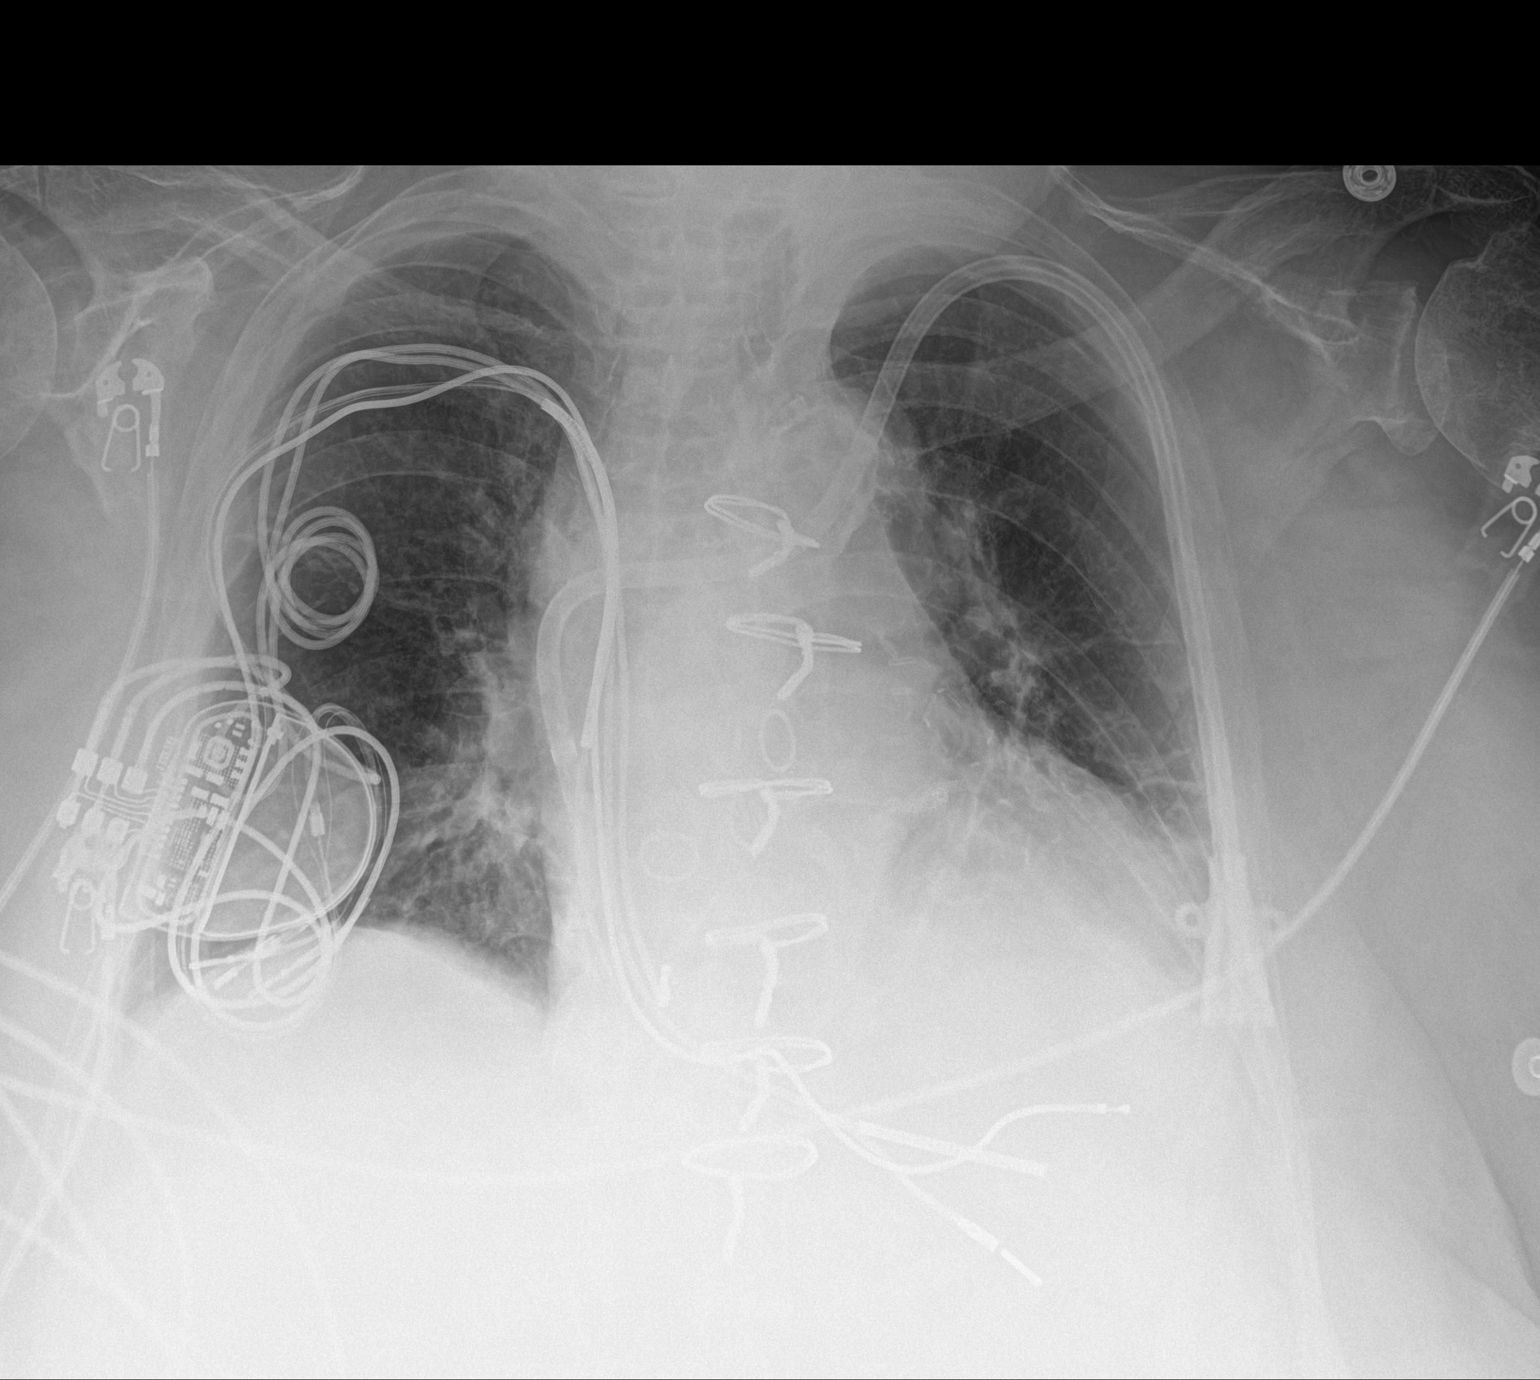

[1 of 1 positions shown; findings below may reference images not displayed]

FINDINGS: Unchanged borderline cardiomegaly. No significant pulmonary vascular
congestion.

Left IJ tunneled hemodialysis catheter is unchanged in position.
Postsurgical changes of CABG again seen. Right chest wall AICD
unchanged in configuration.

Mild left basilar opacity likely result of atelectasis.
IMPRESSION: 1. Borderline cardiomegaly.
2. Mild left basilar atelectasis.

## 2022-06-11 NOTE — Progress Notes (Signed)
Remote pacemaker transmission.   

## 2022-06-11 DEATH — deceased

## 2022-07-11 ENCOUNTER — Encounter (HOSPITAL_COMMUNITY): Payer: Self-pay

## 2022-07-17 NOTE — Progress Notes (Deleted)
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   She was admitted to the hospital 05/11/2022-05/16/2022 due to due to acute renal failure on stage IV CKD. CT abdomen/pelvis was obtained as patient was having epigastric pain. Findings showed pancreatic head mass measuring 1.4 cm. Additionally, there are innumerable new hepatic hypodensities measuring up to 3.0 cm concerning for metastatic disease.   Progressive fatigue, requires assistance for all ADLs and is wheelchair bound.   93/50 BP at oncology   Per oncology 05/26/2022 US guided liver biopsy and CT chest to complete staging. Based on review of CT abdomen/pelvis, findings are most concerning for Stage IV pancreatic cancer involving the liver. I explained that stage IV pancreatic cancer is incurable and mainstay treatment will be chemotherapy. Unfortunately, with patient's baseline performance status and other medical conditions, patient is not a good candidate for systemic chemotherapy. The recommendation would be to not pursue any diagnostic testing and focus on quality of life with palliative care. We will make a referral to our palliative care team today.      Latest Ref Rng & Units 05/15/2022    4:14 AM 05/12/2022    4:23 AM 05/11/2022    4:01 PM  CBC  WBC 4.0 - 10.5 K/uL 7.7  9.4  11.0   Hemoglobin 12.0 - 15.0 g/dL 9.4  40.9  81.1   Hematocrit 36.0 - 46.0 % 31.6  32.2  36.7   Platelets 150 - 400 K/uL 43   81  93         Latest Ref Rng & Units 05/15/2022    4:14 AM 05/14/2022    4:37 AM 05/13/2022    4:36 AM  CMP  Glucose 70 - 99 mg/dL 914  782  956   BUN 8 - 23 mg/dL 38  48  55   Creatinine 0.44 - 1.00 mg/dL 2.13  0.86  5.78   Sodium 135 - 145 mmol/L 136  136  137   Potassium 3.5 - 5.1 mmol/L 3.9  4.0  4.0   Chloride 98 - 111 mmol/L 111  110  109   CO2 22 - 32 mmol/L 20  20  21    Calcium 8.9 - 10.3 mg/dL 8.2  8.1  8.2     EGD 4/69/6295 as an inpatient by Dr. Barron Alvine: - Esophageal plaques were found, suspicious for candidiasis. Biopsied.  - Z-line regular, 40 cm from the incisors.  - Gastroesophageal flap valve classified as Hill Grade III    Review of Systems:   Constitutional: Negative for fever, sweats, chills or weight loss.  Respiratory: Negative for shortness of breath.   Cardiovascular: Negative for chest pain, palpitations and leg swelling.  Gastrointestinal: See HPI.  Musculoskeletal: Negative for back pain or muscle aches.  Neurological: Negative for dizziness, headaches or paresthesias.    Physical Exam: There were no vitals taken for this visit. General: in no acute distress. Head: Normocephalic and atraumatic. Eyes: No scleral icterus. Conjunctiva  pink . Ears: Normal auditory acuity. Mouth: Dentition intact. No ulcers or lesions.  Lungs: Clear throughout to auscultation. Heart: Regular rate and rhythm, no murmur. Abdomen: Soft, nontender and nondistended. No masses or hepatomegaly. Normal bowel sounds x 4 quadrants.  Rectal: *** Musculoskeletal: Symmetrical with no gross deformities. Extremities: No edema. Neurological: Alert oriented x 4. No focal deficits.  Psychological: Alert and cooperative. Normal mood and affect  Assessment and Recommendations: ***

## 2022-07-18 ENCOUNTER — Ambulatory Visit: Payer: Medicaid Other | Admitting: Nurse Practitioner

## 2022-08-26 ENCOUNTER — Encounter (HOSPITAL_COMMUNITY): Payer: Self-pay
